# Patient Record
Sex: Female | Born: 1965
Health system: Southern US, Community
[De-identification: ages and names within clinical notes are randomized; demographics above are authoritative.]

## PROBLEM LIST (undated history)

## (undated) DIAGNOSIS — L97509 Non-pressure chronic ulcer of other part of unspecified foot with unspecified severity: Secondary | ICD-10-CM

## (undated) DIAGNOSIS — I639 Cerebral infarction, unspecified: Secondary | ICD-10-CM

## (undated) DIAGNOSIS — D649 Anemia, unspecified: Secondary | ICD-10-CM

## (undated) DIAGNOSIS — E119 Type 2 diabetes mellitus without complications: Secondary | ICD-10-CM

## (undated) DIAGNOSIS — I619 Nontraumatic intracerebral hemorrhage, unspecified: Secondary | ICD-10-CM

## (undated) DIAGNOSIS — Z960 Presence of urogenital implants: Secondary | ICD-10-CM

## (undated) DIAGNOSIS — M549 Dorsalgia, unspecified: Secondary | ICD-10-CM

## (undated) DIAGNOSIS — E11621 Type 2 diabetes mellitus with foot ulcer: Secondary | ICD-10-CM

## (undated) DIAGNOSIS — G629 Polyneuropathy, unspecified: Secondary | ICD-10-CM

## (undated) DIAGNOSIS — M1990 Unspecified osteoarthritis, unspecified site: Secondary | ICD-10-CM

## (undated) DIAGNOSIS — I10 Essential (primary) hypertension: Secondary | ICD-10-CM

## (undated) HISTORY — DX: Cerebral infarction, unspecified: I63.9

## (undated) HISTORY — PX: BRAIN SURGERY: SHX531

## (undated) NOTE — *Deleted (*Deleted)
Patient Care Team: Barbette Merino, NP as PCP - General (Adult Health Nurse Practitioner)  DIAGNOSIS: No diagnosis found.  CHIEF COMPLIANT: Follow-up of relapsed ITP onRituxan  INTERVAL HISTORY: [name] is a 84 y.o. with above-mentioned history of severe thrombocytopeniacurrently onRituxanand IVIG.Labs on 09/19/19 showed Hg 11.8, HCT 38.3, platelets 92. She presents to the clinic todayforfollow-up.  ALLERGIES:  has No Known Allergies.  MEDICATIONS:  Current Outpatient Medications  Medication Sig Dispense Refill  . acetaminophen (TYLENOL) 500 MG tablet Take 1,000 mg by mouth every 6 (six) hours as needed for mild pain.    Marland Kitchen amLODipine (NORVASC) 5 MG tablet Take 5 mg by mouth daily.    Marland Kitchen aspirin EC 81 MG tablet Take 81 mg by mouth daily as needed for moderate pain. Swallow whole.    . Blood Glucose Monitoring Suppl (PRODIGY AUTOCODE BLOOD GLUCOSE) w/Device KIT 1 kit by Does not apply route in the morning, at noon, in the evening, and at bedtime. 1 kit 0  . collagenase (SANTYL) ointment Apply 1 application topically daily. Wound #1: 2.0 x 1.0 x 0.3cm Wound #2: 2.5 x 1.0 x 2.5 cm Wound #3: 3.5 x 1.5 x 2.5 cm 30 g 0  . gabapentin (NEURONTIN) 400 MG capsule Take 1 capsule (400 mg total) by mouth 2 (two) times daily. 60 capsule 0  . isosorbide mononitrate (IMDUR) 30 MG 24 hr tablet Take 1 tablet (30 mg total) by mouth daily. 90 tablet 1  . metFORMIN (GLUCOPHAGE) 500 MG tablet Take 1 tablet (500 mg total) by mouth 2 (two) times daily with a meal. (Patient taking differently: Take 500 mg by mouth daily with breakfast. ) 60 tablet 1  . metoprolol succinate (TOPROL-XL) 50 MG 24 hr tablet Take 1 tablet (50 mg total) by mouth daily. Take with or immediately following a meal. 90 tablet 1  . ofloxacin (OCUFLOX) 0.3 % ophthalmic solution Place into the right eye.    . prednisoLONE acetate (PRED FORTE) 1 % ophthalmic suspension Place into the right eye.    . rosuvastatin (CRESTOR) 20 MG  tablet Take 1 tablet (20 mg total) by mouth daily. 90 tablet 1   No current facility-administered medications for this visit.    PHYSICAL EXAMINATION: ECOG PERFORMANCE STATUS: {CHL ONC ECOG PS:[id number]}  There were no vitals filed for this visit. There were no vitals filed for this visit.  LABORATORY DATA:  I have reviewed the data as listed CMP Latest Ref Rng & Units 09/19/2019 09/05/2019 08/08/2019  Glucose 70 - 99 mg/dL 161(W) 960(A) 540(J)  BUN 6 - 20 mg/dL 81(X) 91(Y) 14  Creatinine 0.44 - 1.00 mg/dL 7.82(N) 5.62(Z) 3.08  Sodium 135 - 145 mmol/L 139 136 137  Potassium 3.5 - 5.1 mmol/L 5.3(H) 5.3(H) 3.4(L)  Chloride 98 - 111 mmol/L 107 106 108  CO2 22 - 32 mmol/L 24 20(L) 24  Calcium 8.9 - 10.3 mg/dL 9.0 9.6 6.5(H)  Total Protein 6.5 - 8.1 g/dL 7.7 7.9 -  Total Bilirubin 0.3 - 1.2 mg/dL 0.3 0.3 -  Alkaline Phos 38 - 126 U/L 89 87 -  AST 15 - 41 U/L 19 17 -  ALT 0 - 44 U/L 17 10 -    Lab Results  Component Value Date   WBC 5.7 09/19/2019   HGB 11.8 (L) 09/19/2019   HCT 38.3 09/19/2019   MCV 91.0 09/19/2019   PLT 92 (L) 09/19/2019   NEUTROABS 4.3 09/19/2019    ASSESSMENT & PLAN:  No problem-specific Assessment &  Plan notes found for this encounter.    No orders of the defined types were placed in this encounter.  The patient has a good understanding of the overall plan. she agrees with it. she will call with any problems that may develop before the next visit here.  Total time spent: *** mins including face to face time and time spent for planning, charting and coordination of care  Serena Croissant, MD 09/25/2019  I, Kirt Boys Dorshimer, am acting as scribe for Dr. Serena Croissant.  {insert scribe attestation}

---

## 1998-01-20 ENCOUNTER — Emergency Department (HOSPITAL_COMMUNITY): Admission: EM | Admit: 1998-01-20 | Discharge: 1998-01-20 | Payer: Self-pay | Admitting: Emergency Medicine

## 1998-01-23 ENCOUNTER — Ambulatory Visit (HOSPITAL_COMMUNITY): Admission: RE | Admit: 1998-01-23 | Discharge: 1998-01-23 | Payer: Self-pay | Admitting: Obstetrics & Gynecology

## 1998-02-01 ENCOUNTER — Ambulatory Visit (HOSPITAL_COMMUNITY): Admission: RE | Admit: 1998-02-01 | Discharge: 1998-02-01 | Payer: Self-pay | Admitting: Obstetrics and Gynecology

## 1998-02-04 ENCOUNTER — Ambulatory Visit (HOSPITAL_COMMUNITY): Admission: RE | Admit: 1998-02-04 | Discharge: 1998-02-04 | Payer: Self-pay | Admitting: Obstetrics and Gynecology

## 1998-04-09 ENCOUNTER — Other Ambulatory Visit: Admission: RE | Admit: 1998-04-09 | Discharge: 1998-04-09 | Payer: Self-pay | Admitting: Obstetrics & Gynecology

## 2001-11-23 ENCOUNTER — Other Ambulatory Visit: Admission: RE | Admit: 2001-11-23 | Discharge: 2001-11-23 | Payer: Self-pay | Admitting: Family Medicine

## 2002-01-03 ENCOUNTER — Emergency Department (HOSPITAL_COMMUNITY): Admission: EM | Admit: 2002-01-03 | Discharge: 2002-01-03 | Payer: Self-pay | Admitting: Emergency Medicine

## 2002-01-05 ENCOUNTER — Inpatient Hospital Stay (HOSPITAL_COMMUNITY): Admission: AD | Admit: 2002-01-05 | Discharge: 2002-01-08 | Payer: Self-pay | Admitting: Obstetrics and Gynecology

## 2002-01-07 ENCOUNTER — Encounter: Payer: Self-pay | Admitting: Obstetrics and Gynecology

## 2002-01-27 ENCOUNTER — Inpatient Hospital Stay (HOSPITAL_COMMUNITY): Admission: AD | Admit: 2002-01-27 | Discharge: 2002-01-27 | Payer: Self-pay | Admitting: Obstetrics and Gynecology

## 2002-02-19 ENCOUNTER — Inpatient Hospital Stay (HOSPITAL_COMMUNITY): Admission: AD | Admit: 2002-02-19 | Discharge: 2002-02-23 | Payer: Self-pay | Admitting: *Deleted

## 2002-03-19 ENCOUNTER — Observation Stay (HOSPITAL_COMMUNITY): Admission: AD | Admit: 2002-03-19 | Discharge: 2002-03-20 | Payer: Self-pay | Admitting: Obstetrics and Gynecology

## 2002-05-01 ENCOUNTER — Inpatient Hospital Stay (HOSPITAL_COMMUNITY): Admission: AD | Admit: 2002-05-01 | Discharge: 2002-05-03 | Payer: Self-pay | Admitting: Obstetrics and Gynecology

## 2002-05-23 ENCOUNTER — Inpatient Hospital Stay (HOSPITAL_COMMUNITY): Admission: AD | Admit: 2002-05-23 | Discharge: 2002-05-29 | Payer: Self-pay | Admitting: Obstetrics and Gynecology

## 2002-05-25 ENCOUNTER — Encounter (INDEPENDENT_AMBULATORY_CARE_PROVIDER_SITE_OTHER): Payer: Self-pay

## 2002-05-25 ENCOUNTER — Encounter: Payer: Self-pay | Admitting: Obstetrics and Gynecology

## 2002-06-08 ENCOUNTER — Inpatient Hospital Stay (HOSPITAL_COMMUNITY): Admission: AD | Admit: 2002-06-08 | Discharge: 2002-06-10 | Payer: Self-pay | Admitting: Obstetrics and Gynecology

## 2002-06-08 ENCOUNTER — Encounter: Payer: Self-pay | Admitting: Obstetrics and Gynecology

## 2002-06-10 ENCOUNTER — Encounter: Payer: Self-pay | Admitting: Obstetrics and Gynecology

## 2002-07-30 ENCOUNTER — Other Ambulatory Visit: Admission: RE | Admit: 2002-07-30 | Discharge: 2002-07-30 | Payer: Self-pay | Admitting: Obstetrics and Gynecology

## 2003-11-07 ENCOUNTER — Ambulatory Visit: Payer: Self-pay | Admitting: Hematology and Oncology

## 2004-01-02 ENCOUNTER — Ambulatory Visit: Payer: Self-pay | Admitting: Hematology and Oncology

## 2004-01-05 HISTORY — PX: TUBAL LIGATION: SHX77

## 2004-02-18 ENCOUNTER — Ambulatory Visit: Payer: Self-pay | Admitting: Hematology and Oncology

## 2004-02-25 ENCOUNTER — Inpatient Hospital Stay (HOSPITAL_COMMUNITY): Admission: AD | Admit: 2004-02-25 | Discharge: 2004-03-03 | Payer: Self-pay | Admitting: Obstetrics and Gynecology

## 2004-02-29 ENCOUNTER — Encounter (INDEPENDENT_AMBULATORY_CARE_PROVIDER_SITE_OTHER): Payer: Self-pay | Admitting: Specialist

## 2006-07-05 ENCOUNTER — Encounter
Admission: RE | Admit: 2006-07-05 | Discharge: 2006-10-03 | Payer: Self-pay | Admitting: Physical Medicine & Rehabilitation

## 2006-07-12 ENCOUNTER — Ambulatory Visit: Payer: Self-pay | Admitting: Physical Medicine & Rehabilitation

## 2006-07-22 ENCOUNTER — Encounter
Admission: RE | Admit: 2006-07-22 | Discharge: 2006-07-22 | Payer: Self-pay | Admitting: Physical Medicine & Rehabilitation

## 2006-10-24 ENCOUNTER — Encounter
Admission: RE | Admit: 2006-10-24 | Discharge: 2006-10-24 | Payer: Self-pay | Admitting: Physical Medicine & Rehabilitation

## 2006-10-24 ENCOUNTER — Ambulatory Visit: Payer: Self-pay | Admitting: Physical Medicine & Rehabilitation

## 2006-12-30 ENCOUNTER — Emergency Department (HOSPITAL_COMMUNITY): Admission: EM | Admit: 2006-12-30 | Discharge: 2006-12-30 | Payer: Self-pay | Admitting: Emergency Medicine

## 2007-01-03 ENCOUNTER — Ambulatory Visit: Payer: Self-pay | Admitting: Vascular Surgery

## 2007-01-06 ENCOUNTER — Ambulatory Visit: Payer: Self-pay | Admitting: Internal Medicine

## 2007-01-17 ENCOUNTER — Ambulatory Visit: Payer: Self-pay | Admitting: Hematology and Oncology

## 2007-03-08 ENCOUNTER — Ambulatory Visit: Payer: Self-pay | Admitting: Hematology and Oncology

## 2007-05-12 ENCOUNTER — Ambulatory Visit: Payer: Self-pay | Admitting: Hematology and Oncology

## 2010-01-24 ENCOUNTER — Encounter: Payer: Self-pay | Admitting: Internal Medicine

## 2010-05-19 NOTE — Group Therapy Note (Signed)
7/1/08[name] is a 45 year old female who notes onset of low back as  well intrascapular pain since the birth of her child in 2006.  The low  back pain is worse than the intrascapular pain.  She has tried  Chiropractic treatment, has gotten some limited relief.  She has been  evaluated by Dr. Charlett Blake at [facility].  She has undergone  lumbar MRI without contrast dated July 09, 2005 which showed normal  disks, there is mild to moderate facet degenerative changes considering  her age of 26 at the time of the study.  No evidence of any type of  compressive lesions.  She has had normal OB examinations.   FAMILY HISTORY:  Positive for heart disease and hypertension.   PAST MEDICAL HISTORY:  Diabetes.   Patient's pain review indicates that her pain is sharp, constant,  tingling, and aching.  It is up to a 10 out of 10, worse with walking  and bending, improves with heat.  Pain is worse during the daytime  hours.  She has some pain in the knees and the toes as well.  She is  able to drive, climb steps, she needs some help with certain shopping  activities as well as heavier household duties such as mopping or  vacuuming.  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   REVIEW OF SYSTEMS:  Positive for numbness, tingling, depression, and she  has gained some weight.   Her other past medical history is positive for an intracerebral  hemorrhage in 1983-1984 which has left her with some chronic right lower  quadrant visual symptoms as well as mild decreased sensation on the  right side compared to the left side.   SOCIAL HISTORY:  Married, occasional alcohol.   EXAMINATION:  Blood pressure 144/90, pulse 110.  She indicates that she  is a bit nervous today about this visit.  Her respirations are 17, O2  sat 98% room air.  Her spine range of motion is good, she has pain with  end range extension and not with  end range flexion.  Her strength in her  upper and lower extremities is normal for range of motion and the upper  and lower extremities is normal.  Her sensation in the upper and lower  extremities is normal.   Her gait shows no evidence of toe drag or knee instability.  Her knees  have no evidence of crepitus.   IMPRESSION:  1. Lumbar pain given MRI findings and physical exam findings appears      to be most consistent with lumbar facet syndrome, mainly axial pain      in the lumbar spine.  2. Lower extremity pain, normal musculoskeletal exam, may be a sign of      early diabetic neuropathy given her history.  Would need to confirm      with EMG which we can do, however, will first focus on her low back      pain which is her primary complaint.  3. Intrascapular pain.  Palpation of that area __________ reveals no      evidence of scapular winging, no tenderness to palpation.  This may      represent a thoracic facet syndrome, but once again this is a      secondary complaint to her lumbar area pain.   PLAN:  1. I will schedule her for diagnostic lumbar facet medial facet  branch      blocks.  We went over risks and benefits and she would like to      proceed with this.  We will schedule in the next 1-2 weeks here in      this office.  2. Starting on Celebrex 200 mg a day in place of Etodolac, hope to get      increased efficacy, also no anti-platelet affect so that no      interference with the ability to perform spinal injections.   We will also check urine drug screen but do not think narcotic  analgesics are going to have a big role in this particular patient.      Erick Colace, M.D.  Electronically Signed     AEK/MedQ  D:  07/12/2006 17:20:35  T:  07/13/2006 10:10:12  Job #:  [id number]   cc:   Lenoard Aden, M.D.  Fax: [phone]   Renaye Rakers, M.D.  Fax: [phone]

## 2010-05-19 NOTE — Procedures (Signed)
[name], [name]             ACCOUNT NO.:  [id number]   MEDICAL RECORD NO.:  [id number]          PATIENT TYPE:  REC   LOCATION:  [facility]                          FACILITY:  [facility]   PHYSICIAN:  [name], M.D.DATE OF BIRTH:  [birth year]   DATE OF PROCEDURE:  10/24/2006  DATE OF DISCHARGE:                               OPERATIVE REPORT   A 45 year old female with low back pain with positive facet arthropathy  on imaging studies.  Pain is axial, increased at extension.  Interferes  with ADLs an Mobility only partially relieved with medications.   Procedure:  Bilateral L5 Dorsal Ramus injection L4 medial branch block  L3 medial branch block under fluoroscopic guideance   Informed consent was obtained after describing risks and benefits  procedure to the patient.  These include bleeding, bruising, infection,  loss of bowel or bladder function, temporary or permanent paralysis,  elects to proceed and has given written consent.  The patient placed  prone on fluoroscopy table.  Betadine prep, sterile drape 25-gauge 1-1/2  inch needle was used to anesthetize skin and subcu tissue with 1%  lidocaine x2 mL, a 22 gauge 5 inch spinal needle was inserted under  fluoroscopic guidance first targeting left S1 SAP sacral ala junction  bone contact made with lateral imaging.  Omnipaque 180 x 0.5 mL  demonstrated no intravascular uptake.  Then 0.5 mL dexamethasone  lidocaine solution injected.  The same procedure was repeated on the  right side corresponding levels same technique injectate and equipment.  Then the left, then the right L5 SAP transverse junction targeted bone  contact made confirmed lateral imaging.  Omnipaque 180 x 0.5 mL  demonstrated no intravascular uptake and 0.5 mL dexamethasone lidocaine  solution injected and this same procedure was done corresponding levels  left side, then the left and then the right L5-L4 SAP transverse process  junction target, bone contact made  confirmed lateral imaging.  Omnipaque  180 x 0.5 mL demonstrated no intravascular uptake and 0.5 mL of  dexamethasone lidocaine solution injected.  Pre post injection level  vitals stable, same corresponding levels done on left side.  Post  injection vitals stable.  Post injection instructions given.  Return in  3 weeks.  Pain diary given.  Will assess for at least 50% reduction in  pain levels.      [name], M.D.  Electronically Signed     AEK/MEDQ  D:  10/24/2006 15:12:08  T:  10/25/2006 11:07:40  Job:  [id number]

## 2010-05-19 NOTE — Procedures (Signed)
DUPLEX DEEP VENOUS EXAM - LOWER EXTREMITY   INDICATION:  Right leg pain and swelling.   HISTORY:  Edema:  Yes.  Trauma/Surgery:  No.  Pain:  Yes.  PE:  No.  Previous DVT:  No.  Anticoagulants:  Other:   DUPLEX EXAM:                CFV   SFV   PopV  PTV    GSV                R  L  R  L  R  L  R   L  R  L  Thrombosis    0  0  0     0     0      0  Spontaneous   +  +  +     +     +      +  Phasic        +  +  +     +     +      +  Augmentation  +  +  +     +     +      +  Compressible  +  +  +     +     +      +  Competent     +  +  +     +     +      +   Legend:  + - yes  o - no  p - partial  D - decreased   IMPRESSION:  No evidence of deep venous thrombosis noted in the right  leg.   Harl Favor at the doctor's office.    _____________________________  Larina Earthly, M.D.   MG/MEDQ  D:  01/03/2007  T:  01/03/2007  Job:  [id number]

## 2010-05-22 NOTE — Discharge Summary (Signed)
   [name], [name]                       ACCOUNT NO.:  [id number]   MEDICAL RECORD NO.:  [id number]                   PATIENT TYPE:  INP   LOCATION:  9129                                 [facility]ACILITY:  [facility]   PHYSICIAN:  Lenoard Aden, M.D.             DATE OF BIRTH:  01/23/1965   DATE OF ADMISSION:  01/05/2002  DATE OF DISCHARGE:  01/08/2002                                 DISCHARGE SUMMARY   HISTORY OF PRESENT ILLNESS:  The patient was admitted for nausea and  vomiting in second trimester of pregnancy, poor tolerance to medications on  January 05, 2002.  Received IV fluids, IV potassium, blood glucose  monitoring, Protonix, and Reglan.  Improved slowly over the course of her  hospitalization.  Potassium improved.  She was discharged to home on  hospital day number four on her diabetic protocol, Protonix and Reglan.  She  is to follow up in the office in one week.                                               Lenoard Aden, M.D.    RJT/MEDQ  D:  03/20/2002  T:  03/21/2002  Job:  [id number]

## 2010-05-22 NOTE — H&P (Signed)
   NAME:  [name], [name]                         ACCOUNT NO.:  [id number]   MEDICAL RECORD NO.:  [id number]                   PATIENT TYPE:   LOCATION:                                       FACILITY:   PHYSICIAN:  Nickolus Wadding DICTATOR                    DATE OF BIRTH:   DATE OF ADMISSION:  DATE OF DISCHARGE:                                HISTORY & PHYSICAL   CHIEF COMPLAINT:  1. Nausea and vomiting.  2. Diabetes mellitus out of control.  3. Elevated blood pressure.   HISTORY OF PRESENT ILLNESS:  The patient is a 45 year old African-American  female, Gravida II, Para 0, at 17 weeks with intrauterine gestation  estimated date of confinement June 15, 2002, who presents with worsening  nausea and vomiting and 10 pound weight loss over four weeks. She was seen  in the [facility] emergency room two days ago with an elevated  blood glucose of 266 and a blood pressure of 150/100. The patient has been  noncompliant with follow-up of her obstetric care.   PAST MEDICAL HISTORY:  Uncomplicated miscarriage in 2000 in the first  trimester. She has had otherwise, normal GYN history. She has an ultrasound  confirming viability.   SOCIAL HISTORY:  Nonsmoker and nondrinker. She denies domestic or physical  violence.   MEDICATIONS:  None.   FAMILY HISTORY:  She has a grandmother with diabetes mellitus, heart  disease, and hypertension.   PHYSICAL EXAMINATION:  GENERAL: An ill appearing African-American female in  no obvious distress.  VITAL SIGNS: Weight 223 pounds. Blood pressure 140/90.  LUNGS: Clear.  HEART: Regular rate and rhythm.  ABDOMEN: Soft, gravid, 17 weeks and nontender.  GU: Cervix closed.  EXTREMITIES: Within normal limits.  NEURO: Nonfocal.   IMPRESSION:  1. A 17 week intrauterine pregnancy.  2. Nausea and vomiting with pregnancy.  3. Poorly controlled diabetes mellitus.  4. Probable chronic hypertension.    PLAN:  Admit to [facility]. IV fluid.  Hydration. Observation of blood  pressures. Check fasting glucoses and 2 hour postprandial glucoses.  Establish insulin protocol as needed. Diabetic teaching. Follow-up in the  office within one week. Will check hemoglobin A1C. Will check chemistry 24.                                                 Ha Shannahan DICTATOR    DD/MEDQ  D:  01/05/2002  T:  01/05/2002  Job:  [id number]   cc:   Lenoard Aden, M.D.  [address], [address]  [place]  [place] [address]  Fax: [phone]

## 2010-05-22 NOTE — Discharge Summary (Signed)
[name], [name]             ACCOUNT NO.:  [id number]   MEDICAL RECORD NO.:  [id number]          PATIENT TYPE:  INP   LOCATION:  9109                          [facility]ILITY:  [facility]   PHYSICIAN:  Lenoard Aden, M.D.DATE OF BIRTH:  11/24/1965   DATE OF ADMISSION:  02/25/2004  DATE OF DISCHARGE:  03/03/2004                                 DISCHARGE SUMMARY   ADMISSION DIAGNOSES:  1.  Nausea/vomiting.  2.  Chronic hypertension.  3.  Diabetes.   DISCHARGE DIAGNOSES:  1.  Nausea/vomiting.  2.  Chronic hypertension.  3.  Diabetes.  4.  Repeat cesarean section and tubal ligation.   The patient was admitted for chronic hypertension with persistent nausea and  vomiting February 25, 2004. Course was complicated by inability to control  her symptomatology despite IV fluids, antiemetics, and GI consultation. Due  to worsening superimposed preeclampsia, decision was made at 35 weeks to  proceed with repeat C-section with superimposed preeclampsia. Tubal ligation  was performed. Postoperative course uncomplicated, magnesium tocolysis  performed, I&O's within normal limits. The patient is discharged to home on  postoperative day #3. Labetalol, prednisone, Motrin, Tylox, prenatal  vitamins given. The patient is to follow up with her internal medicine  doctor, follow-up in the office within 1 week. Discharge teaching done.      RJT/MEDQ  D:  03/30/2004  T:  03/30/2004  Job:  [id number]

## 2010-05-22 NOTE — Op Note (Signed)
[name], [name]             ACCOUNT NO.:  [id number]   MEDICAL RECORD NO.:  [id number]          PATIENT TYPE:  INP   LOCATION:  9372                          [facility]ACILITY:  [facility]   PHYSICIAN:  Lenoard Aden, M.D.DATE OF BIRTH:  10/29/1965   DATE OF PROCEDURE:  02/29/2004  DATE OF DISCHARGE:                                 OPERATIVE REPORT   PREOPERATIVE DIAGNOSES:  1.  35-3/7 week intrauterine pregnancy.  2.  Chronic hypertension with superimposed preeclampsia.  3.  Previous cesarean section for repeat and tubal ligation.  4.  Homero Fellers breech presentation.   POSTOPERATIVE DIAGNOSES:  1.  35-3/7 week intrauterine pregnancy.  2.  Chronic hypertension with superimposed preeclampsia.  3.  Previous cesarean section for repeat and tubal ligation.  4.  Homero Fellers breech presentation.   PROCEDURE:  Repeat low segment transverse cesarean section and tubal  ligation.   SURGEON:  Lenoard Aden, M.D.   ASSISTANT:  Richardean Sale, M.D.   ANESTHESIA:  Spinal by Quillian Quince, M.D.   ESTIMATED BLOOD LOSS:  7 cc.   COMPLICATIONS:  None.   COUNTS:  Correct.   DISPOSITION:  Patient to recovery in good condition.   DRAINS:  Foley catheter.   FINDINGS:  Full-term living female in frank breech position.  Apgars 8 and  9.  Cord pH pending.  Pediatricians in attendance.   SPECIMENS:  Tubal segments and placenta to pathology.   DESCRIPTION OF PROCEDURE:  Being apprised of the risks of anesthesia,  infection, bleeding, intra-abdominal ____________ immediate complications to  include bowel and bladder injury were noted and failure risks of tubal  ligation of 5 to 10 per 1000.  The patient was brought to the operating room  where she was administered a spinal anesthetic without complication.  She  was prepped and draped in the usual sterile fashion.  A Foley catheter was  placed.   After achieving adequate anesthesia and Marcaine solution placed in the  area.  A Pfannenstiel skin  incision was made with the scalpel and carried  down to the fascia which was nicked in the midline and opened transversely  using Mayo scissors.  The rectus muscles were dissected sharply in the  midline.  The peritoneum was entered sharply.  Omentum was adhesed to the  posterior aspect of the anterior abdominal wall.  This was released using  electrocautery, and good hemostasis was noted.  The bladder flap was sharply  developed off the lower uterine segment using Metzenbaum scissors.  The  bladder blade was placed.  A curved hysterotomy incision was made.  Amniotomy with clear fluid.  Atraumatic delivery, frank breech position.  Usual maneuvers with suction of the fetal vertex Handed to the pediatricians  in attendance.  Apgars 8 and 9.  Cord blood collected.  Cord pH collected.  Placenta delivered manually intact and sent to pathology.  The uterus was  exteriorized and curetted using a dry lap pad.  It was closed in one layer  using 0 Monocryl continuous running suture.  Lateral sutures at the angles  for hemostasis were placed.  Interrupted sutures x2.  At this time, the right tube was traced out to the fimbriated end.  The  ampullary isthmic portion of tube was identified.  Avascular portion of the  mesosalpinx was cauterized, creating a window.  0 plain ties were placed  proximally and distally and tubal segment excised.  The same procedure done  on the left tube.  Tubal segment was excised and sent to pathology.  Good  hemostasis was noted.  The tubal lumens were cauterized, and good hemostasis  was noted.  The uterine incision was found to be hemostatic.  The bladder  flap was hemostatic.  Irrigation accomplished.  The fascia was closed using  0 Monocryl in the fascia and the skin closed using staples.   The patient tolerated the procedure well and was transferred to recovery in  good condition.      RJT/MEDQ  D:  02/29/2004  T:  03/01/2004  Job:  [id number]

## 2010-05-22 NOTE — Consult Note (Signed)
NAMEVANYA, [name]             ACCOUNT NO.:  [id number]   MEDICAL RECORD NO.:  [id number]          PATIENT TYPE:  INP   LOCATION:  9158                          FACILITY:  [facility]   PHYSICIAN:  Bernette Redbird, M.D.   DATE OF BIRTH:  09-Dec-1965   DATE OF CONSULTATION:  02/27/2004  DATE OF DISCHARGE:                                   CONSULTATION   Dr. Seymour Bars, covering for Dr. Billy Coast, asked [place] to see this 45 year old  African-American female who is [redacted] weeks pregnant because of coffee ground  emesis.   [name] is known to my partner, Dr. Ewing Schlein, when she was in the hospital  two years ago with nausea and vomiting during a pregnancy at that time.   She has not had any __________ nausea or vomiting problems but was admitted  to the hospital two days ago with her first episode of nausea and vomiting  associated with this pregnancy.  Of note, the patient denies GI  symptomatology in between her pregnancies, that is, she does not have any  chronic GI problems. She has never had an ulcer and has not recently been on  ulcerogenic medications. She is not currently having abdominal pain.   Today, her emesis turned from being bilious in character to being black  although it has subsequently changed back to being bilious in character, per  conversation with the nurses. She has never had frank blood or clots.   The patient's platelet count has been low, the etiology of which is not  entirely clear since she does not have elevated liver chemistries (HELLP  syndrome, for example).   PAST MEDICAL HISTORY:  No known allergies.   OUTPATIENT MEDICATIONS:  Labetalol, glyburide, ferrous sulfate and  prednisone.   OPERATION:  None.   PREVIOUS ILLNESSES:  Some sort of CNS problem, possibly an aneurysm or  stroke in the past, apparently treated surgically, but no abdominal surgery  other than the previous C section.   CHRONIC MEDICAL ILLNESSES:  Gestational diabetes and atypical nausea and  vomiting with prior pregnancy.   FAMILY HISTORY:  Negative for GI illnesses.   REVIEW OF SYMPTOMS:  See above.   PHYSICAL EXAMINATION:  GENERAL:  [name] is a pleasant somewhat overweight  African-American female in no evident distress appearing neither anxious nor  depressed. She is anicteric and without frank pallor.  CHEST:  Clear.  HEART:  Has a slightly rapid rate but is unremarkable. There may be a 1/6  systolic murmur at the upper left sternal border.  ABDOMEN:  Of course gravid but nontender.   LABORATORY DATA:  Hemoglobin today is 8.7, unchanged from yesterday.  White  count 7100.  Her platelet count has risen from 61,000 yesterday to 83,000  currently.  83 polys, 11 lymphs, 7 monocytes.  Chemistry panel shows a BUN  yesterday of 4 and a BUN today of 6 and liver chemistries are within normal  limits.   IMPRESSION:  1.  Transient coffee ground emesis, now resolved, unlikely to be reflective      of any clinically significant GI bleed given the stability of her  hemoglobin over the past 24 hours and the repeatedly normal BUN level. I      presume that the coffee ground emesis is coming from mucosal trauma      related to the patient's repeated vomiting, enhanced by the presence of      her thrombocytopenia.  It seems to be resolving either due to an      improvement in her platelet count today and/or the institution of PPI      therapy (she was previously on famotidine, now is on intravenous      Protonix).  2.  Protracted vomiting, probably some variant of pregnancy-associated      gastric dysmotility. I doubt it is due to an ulcer or gastric outlet      obstruction.  (Note the absence of any succussion splash on current      examination, plus she has no ulcer risk factors).   RECOMMENDATIONS:  1.  I would favor continuing Protonix therapy until she goes about 24 hours      without coffee ground emesis, after which she could be placed on an H2      blocker such as  Pepcid. She should probably remain on that medication      until her GI symptoms clear up.  2.  At this time, I do not think endoscopy would be helpful and therefore I      would hold off on doing an EGD unless or until active frank GI bleeding      occurs. I do not think an endoscopy would be likely to lead to      clinically useful diagnostic information (for example, I think there is      a low probability of an ulcer, and even if there is, she is being      treated for it anyway with the protonix).  3.  If the nausea and vomiting persist, and for example, the patient cannot      or does not undergo a C section with resolution of symptoms in the near      future, the patient might be considered for nutritional support with      TNA.   We appreciate the opportunity to have seen this patient in consultation with  you.      RB/MEDQ  D:  02/27/2004  T:  02/27/2004  Job:  [id number]   cc:   Lenoard Aden, M.D.  [address]  [place]  [place] [address]  Fax: [phone]

## 2010-05-22 NOTE — H&P (Signed)
[name], [name]             ACCOUNT NO.:  [id number]   MEDICAL RECORD NO.:  [id number]          PATIENT TYPE:  INP   LOCATION:  9158                          FACILITY:  [facility]   PHYSICIAN:  Lenoard Aden, M.D.DATE OF BIRTH:  10-02-1965   DATE OF ADMISSION:  02/25/2004  DATE OF DISCHARGE:                                HISTORY & PHYSICAL   CHIEF COMPLAINT:  Nausea and vomiting.   HISTORY OF PRESENT ILLNESS:  The patient is a 45 year old African American  female, G3, P1, with EDC of April 01, 2004 at [redacted] weeks gestation with known  class AII diabetes mellitus, chronic hypertension, and atypical nausea and  vomiting. She is presenting now for the first episode of this pregnancy.  Previous pregnancy complicated by atypical nausea and vomiting with normal  GI workup.   PAST MEDICAL HISTORY:  1.  First trimester pregnancy loss.  2.  Primary cesarean section.  3.  History of postpartum congestive heart failure.  4.  History of diabetes onset during pregnancy.  5.  History of hypertensive disease.  6.  History of gestational tinea pedis versus ITP this pregnancy with follow-      up hematology.   FAMILY HISTORY:  Diabetes, heart disease, and hypertension.   MEDICATIONS:  Glyburide, prednisone, prenatal vitamins, Labetalol.   SOCIAL HISTORY:  Otherwise noncontributory.   PHYSICAL EXAMINATION:  GENERAL:  She is an ill-appearing African American  female in no acute distress.  VITAL SIGNS:  Blood pressure 122/70. Weight of 224, down 9 pounds over the  last 9 days.  HEENT:  Normal.  LUNGS:  Clear.  HEART:  Regular rate and rhythm.  ABDOMEN:  Soft, gravid, and nontender. Fundal height of 35 noted. EPP 8 out  of 8 with AGA fetus noted.  PELVIC:  Cervical exam is deferred.  EXTREMITIES:  No cords. DTRs are 2+. No evidence of clonus.  NEUROLOGICAL:  Nonfocal.   LABORATORY DATA:  Normal metabolic profile and a CBC remarkable for a  platelet count of 56,000.   IMPRESSION:  1.   A 35-week obstetrical.  2.  Class AII diabetes mellitus, previously stable on Glyburide.  3.  Probable idiopathic thrombocytopenic purpura, currently on prednisone      and noncompliant with the medication.  4.  Atypical nausea and vomiting, probably secondary to reflux.  5.  Chronic hypertension.  6.  History of postpartum congestive heart failure.   PLAN:  Plan is to admit. IV fluids. IV Reglan, IV Pepcid. Phenergan p.r.n.  Continue medications as previously noted. Hematology consult as needed.  Continuous monitoring. Add Ambien for sleep. Anticipate short-term  hospitalization versus hospitalization until needed for delivery.      RJT/MEDQ  D:  02/25/2004  T:  02/25/2004  Job:  [id number]   cc:   [facility]

## 2010-10-09 LAB — DIFFERENTIAL
Basophils Absolute: 0
Basophils Relative: 0
Eosinophils Absolute: 0.1
Eosinophils Relative: 2
Lymphocytes Relative: 18
Lymphs Abs: 0.9
Monocytes Absolute: 0.4
Monocytes Relative: 7
Neutro Abs: 3.7
Neutrophils Relative %: 73

## 2010-10-09 LAB — BASIC METABOLIC PANEL
BUN: 9
CO2: 25
Calcium: 9.1
Chloride: 102
Creatinine, Ser: 0.77
GFR calc Af Amer: >60
GFR calc non Af Amer: >60
Glucose, Bld: 195 — ABNORMAL HIGH
Potassium: 4.1
Sodium: 136

## 2010-10-09 LAB — CBC
HCT: 34.6 — ABNORMAL LOW
Hemoglobin: 11.3 — ABNORMAL LOW
MCHC: 32.6
MCV: 85.7
Platelets: 65 — ABNORMAL LOW
RBC: 4.03
RDW: 13.8
WBC: 5.1

## 2010-10-09 LAB — D-DIMER, QUANTITATIVE (NOT AT ARMC): D-Dimer, Quant: 0.57 — ABNORMAL HIGH

## 2010-10-09 LAB — B-NATRIURETIC PEPTIDE (CONVERTED LAB): Pro B Natriuretic peptide (BNP): <30

## 2010-11-13 ENCOUNTER — Other Ambulatory Visit: Payer: Self-pay | Admitting: Family Medicine

## 2010-11-13 DIAGNOSIS — Z1231 Encounter for screening mammogram for malignant neoplasm of breast: Secondary | ICD-10-CM

## 2010-11-22 ENCOUNTER — Encounter: Payer: Self-pay | Admitting: *Deleted

## 2010-11-22 ENCOUNTER — Emergency Department (HOSPITAL_COMMUNITY): Payer: 59

## 2010-11-22 ENCOUNTER — Inpatient Hospital Stay (HOSPITAL_COMMUNITY)
Admission: EM | Admit: 2010-11-22 | Discharge: 2010-11-29 | DRG: 041 | Disposition: A | Discharge status: Home Health | Payer: 59 | Attending: Internal Medicine | Admitting: Internal Medicine

## 2010-11-22 DIAGNOSIS — R209 Unspecified disturbances of skin sensation: Secondary | ICD-10-CM

## 2010-11-22 DIAGNOSIS — Z9119 Patient's noncompliance with other medical treatment and regimen: Secondary | ICD-10-CM

## 2010-11-22 DIAGNOSIS — M1990 Unspecified osteoarthritis, unspecified site: Secondary | ICD-10-CM | POA: Insufficient documentation

## 2010-11-22 DIAGNOSIS — E11621 Type 2 diabetes mellitus with foot ulcer: Secondary | ICD-10-CM

## 2010-11-22 DIAGNOSIS — I619 Nontraumatic intracerebral hemorrhage, unspecified: Secondary | ICD-10-CM | POA: Insufficient documentation

## 2010-11-22 DIAGNOSIS — L0390 Cellulitis, unspecified: Secondary | ICD-10-CM

## 2010-11-22 DIAGNOSIS — M129 Arthropathy, unspecified: Secondary | ICD-10-CM | POA: Diagnosis present

## 2010-11-22 DIAGNOSIS — D500 Iron deficiency anemia secondary to blood loss (chronic): Secondary | ICD-10-CM | POA: Diagnosis present

## 2010-11-22 DIAGNOSIS — L608 Other nail disorders: Secondary | ICD-10-CM | POA: Diagnosis present

## 2010-11-22 DIAGNOSIS — H539 Unspecified visual disturbance: Secondary | ICD-10-CM

## 2010-11-22 DIAGNOSIS — L97509 Non-pressure chronic ulcer of other part of unspecified foot with unspecified severity: Secondary | ICD-10-CM | POA: Diagnosis present

## 2010-11-22 DIAGNOSIS — N920 Excessive and frequent menstruation with regular cycle: Secondary | ICD-10-CM | POA: Diagnosis present

## 2010-11-22 DIAGNOSIS — E1142 Type 2 diabetes mellitus with diabetic polyneuropathy: Secondary | ICD-10-CM | POA: Diagnosis present

## 2010-11-22 DIAGNOSIS — E1149 Type 2 diabetes mellitus with other diabetic neurological complication: Principal | ICD-10-CM | POA: Diagnosis present

## 2010-11-22 DIAGNOSIS — M9080 Osteopathy in diseases classified elsewhere, unspecified site: Secondary | ICD-10-CM | POA: Diagnosis present

## 2010-11-22 DIAGNOSIS — I96 Gangrene, not elsewhere classified: Secondary | ICD-10-CM | POA: Diagnosis present

## 2010-11-22 DIAGNOSIS — I69998 Other sequelae following unspecified cerebrovascular disease: Secondary | ICD-10-CM

## 2010-11-22 DIAGNOSIS — Z7982 Long term (current) use of aspirin: Secondary | ICD-10-CM

## 2010-11-22 DIAGNOSIS — M869 Osteomyelitis, unspecified: Secondary | ICD-10-CM | POA: Diagnosis present

## 2010-11-22 DIAGNOSIS — E11628 Type 2 diabetes mellitus with other skin complications: Secondary | ICD-10-CM

## 2010-11-22 DIAGNOSIS — E1165 Type 2 diabetes mellitus with hyperglycemia: Secondary | ICD-10-CM

## 2010-11-22 DIAGNOSIS — M6240 Contracture of muscle, unspecified site: Secondary | ICD-10-CM | POA: Diagnosis present

## 2010-11-22 DIAGNOSIS — I10 Essential (primary) hypertension: Secondary | ICD-10-CM | POA: Diagnosis present

## 2010-11-22 DIAGNOSIS — G629 Polyneuropathy, unspecified: Secondary | ICD-10-CM

## 2010-11-22 DIAGNOSIS — E119 Type 2 diabetes mellitus without complications: Secondary | ICD-10-CM | POA: Diagnosis present

## 2010-11-22 DIAGNOSIS — Z91199 Patient's noncompliance with other medical treatment and regimen due to unspecified reason: Secondary | ICD-10-CM

## 2010-11-22 DIAGNOSIS — D649 Anemia, unspecified: Secondary | ICD-10-CM

## 2010-11-22 HISTORY — DX: Nontraumatic intracerebral hemorrhage, unspecified: I61.9

## 2010-11-22 HISTORY — DX: Type 2 diabetes mellitus with foot ulcer: L97.509

## 2010-11-22 HISTORY — DX: Type 2 diabetes mellitus with foot ulcer: E11.621

## 2010-11-22 HISTORY — DX: Essential (primary) hypertension: I10

## 2010-11-22 HISTORY — DX: Polyneuropathy, unspecified: G62.9

## 2010-11-22 HISTORY — DX: Unspecified osteoarthritis, unspecified site: M19.90

## 2010-11-22 LAB — CBC
HCT: 28.3 % — ABNORMAL LOW (ref 36.0–46.0)
Hemoglobin: 9.6 g/dL — ABNORMAL LOW (ref 12.0–15.0)
MCH: 26.7 pg (ref 26.0–34.0)
MCHC: 33.9 g/dL (ref 30.0–36.0)
MCV: 78.8 fL (ref 78.0–100.0)
Platelets: 435 10*3/uL — ABNORMAL HIGH (ref 150–400)
RBC: 3.59 MIL/uL — ABNORMAL LOW (ref 3.87–5.11)
RDW: 17.9 % — ABNORMAL HIGH (ref 11.5–15.5)
WBC: 11.2 10*3/uL — ABNORMAL HIGH (ref 4.0–10.5)

## 2010-11-22 LAB — BASIC METABOLIC PANEL
BUN: 9 mg/dL (ref 6–23)
CO2: 26 mEq/L (ref 19–32)
Calcium: 9.6 mg/dL (ref 8.4–10.5)
Chloride: 90 mEq/L — ABNORMAL LOW (ref 96–112)
Creatinine, Ser: 0.69 mg/dL (ref 0.50–1.10)
GFR calc Af Amer: >90 mL/min (ref >90)
GFR calc non Af Amer: >90 mL/min (ref >90)
Glucose, Bld: 420 mg/dL — ABNORMAL HIGH (ref 70–99)
Potassium: 3.8 mEq/L (ref 3.5–5.1)
Sodium: 129 mEq/L — ABNORMAL LOW (ref 135–145)

## 2010-11-22 LAB — GLUCOSE, CAPILLARY
Glucose-Capillary: 459 mg/dL — ABNORMAL HIGH (ref 70–99)
Glucose-Capillary: 329 mg/dL — ABNORMAL HIGH (ref 70–99)

## 2010-11-22 MED ORDER — INSULIN REGULAR HUMAN 100 UNIT/ML IJ SOLN
4.0000 [IU] | Freq: Once | INTRAMUSCULAR | Status: DC
  Filled 2010-11-22: qty 0.04

## 2010-11-22 MED ORDER — ACETAMINOPHEN 325 MG PO TABS
ORAL_TABLET | ORAL | Status: AC
  Administered 2010-11-22: 650 mg via ORAL
  Filled 2010-11-22: qty 2

## 2010-11-22 MED ORDER — VANCOMYCIN HCL IN DEXTROSE 1-5 GM/200ML-% IV SOLN
1000.0000 mg | Freq: Once | INTRAVENOUS | Status: AC
  Administered 2010-11-22: 1000 mg via INTRAVENOUS
  Filled 2010-11-22: qty 200

## 2010-11-22 MED ORDER — PIPERACILLIN-TAZOBACTAM 3.375 G IVPB
3.3750 g | Freq: Once | INTRAVENOUS | Status: AC
  Administered 2010-11-22: 3.375 g via INTRAVENOUS
  Filled 2010-11-22: qty 50

## 2010-11-22 MED ORDER — INSULIN REGULAR HUMAN 100 UNIT/ML IJ SOLN
4.0000 [IU] | Freq: Once | INTRAMUSCULAR | Status: DC
  Administered 2010-11-22: 4 [IU] via SUBCUTANEOUS

## 2010-11-22 MED ORDER — INSULIN ASPART 100 UNIT/ML ~~LOC~~ SOLN: 4.0000 [IU] | Freq: Once | SUBCUTANEOUS | Status: DC

## 2010-11-22 MED ORDER — INSULIN ASPART 100 UNIT/ML ~~LOC~~ SOLN
SUBCUTANEOUS | Status: AC
  Filled 2010-11-22: qty 1

## 2010-11-22 MED ORDER — SODIUM CHLORIDE 0.9 % IV BOLUS (SEPSIS)
1000.0000 mL | Freq: Once | INTRAVENOUS | Status: AC
  Administered 2010-11-22: 1000 mL via INTRAVENOUS

## 2010-11-22 NOTE — ED Notes (Signed)
Reports bleeding to right foot since yesterday, pt has bandaged pta. Reports recently being diagnosed with DM, swelling noted to foot and leg, redness and warm to touch. Bleeding controlled at this time.

## 2010-11-22 NOTE — ED Provider Notes (Addendum)
History     CSN: [id number] Arrival date & time: 11/22/2010  6:13 PM   First MD Initiated Contact with Patient 11/22/10 1900      Chief Complaint  Patient presents with  . Foot Swelling    (Consider location/radiation/quality/duration/timing/severity/associated sxs/prior treatment) The history is provided by the patient.   is reports several weeks of worsening ulcerations of his of her bilateral feet and reports since yesterday her skin on the right foot sloughed off when she began having bleeding.  She is a diabetic and has been for approximately a year she's been noncompliant with medications and her diabetic regimen.  She denies fever or chills.  She does report redness and erythema of her proximal right ankles as well.  She would like antibiotics and to go home and take care of her kids.  She has a primary care physician within the last 2 weeks at [facility].  Nothing worsens her symptoms.  Nothing improves her symptoms.  Her symptoms are constant.  Does have a history of neuropathy.  Past Medical History  Diagnosis Date  . Diabetes mellitus   . Hypertension   . Arthritis   . Neuropathy     History reviewed. No pertinent past surgical history.  History reviewed. No pertinent family history.  History  Substance Use Topics  . Smoking status: Never Smoker   . Smokeless tobacco: Not on file  . Alcohol Use: No    OB History    Grav Para Term Preterm Abortions TAB SAB Ect Mult Living                  Review of Systems  All other systems reviewed and are negative.    Allergies  Review of patient's allergies indicates no known allergies.  Home Medications   Current Outpatient Rx  Name Route Sig Dispense Refill  . ASPIRIN EC 81 MG PO TBEC Oral Take 81 mg by mouth daily.      Marland Kitchen NAPROXEN SODIUM 220 MG PO TABS Oral Take 440 mg by mouth daily as needed.        BP 149/78  Pulse 113  Temp(Src) 98.8 F (37.1 C) (Oral)  Resp 20  SpO2 100%  LMP  10/19/2010  Physical Exam  Nursing note and vitals reviewed. Constitutional: She is oriented to person, place, and time. She appears well-developed and well-nourished. No distress.  HENT:  Head: Normocephalic and atraumatic.  Eyes: EOM are normal.  Neck: Normal range of motion.  Cardiovascular: Normal rate, regular rhythm and normal heart sounds.   Pulmonary/Chest: Effort normal and breath sounds normal.  Abdominal: Soft. She exhibits no distension. There is no tenderness.  Musculoskeletal:       Left foot with small blistering ulceration on the sole of her foot without significant surrounding erythema or drainage of purulent material.  Right foot with significant erythema swelling and tenderness of her right foot right ankle and right distal anterior tibia she has skin loss overlying the distal aspects of her first and second and third metatarsals.  There is a foul smell coming from this area with a small amount of nonpulsatile bleeding.  She appears to have necrosis of several of her distal toes.  There does appear to be a palpable DP pulse in her right and left foot  Neurological: She is alert and oriented to person, place, and time.  Skin: Skin is warm and dry.  Psychiatric: She has a normal mood and affect. Judgment normal.  ED Course  Procedures (including critical care time)  Labs Reviewed  GLUCOSE, CAPILLARY - Abnormal; Notable for the following:    Glucose-Capillary 459 (*)    All other components within normal limits  CBC - Abnormal; Notable for the following:    WBC 11.2 (*)    RBC 3.59 (*)    Hemoglobin 9.6 (*)    HCT 28.3 (*)    RDW 17.9 (*)    Platelets 435 (*)    All other components within normal limits  BASIC METABOLIC PANEL - Abnormal; Notable for the following:    Sodium 129 (*)    Chloride 90 (*)    Glucose, Bld 420 (*)    All other components within normal limits  GLUCOSE, CAPILLARY - Abnormal; Notable for the following:    Glucose-Capillary 329 (*)     All other components within normal limits  HEMOGLOBIN A1C  CULTURE, BLOOD (ROUTINE X 2)  CULTURE, BLOOD (ROUTINE X 2)   Dg Foot Complete Right  11/22/2010  *RADIOLOGY REPORT*  Clinical Data: Diabetic foot ulcers.  RIGHT FOOT COMPLETE - 3+ VIEW 11/22/2010:  Comparison: None.  Findings: Marked soft tissue swelling and gas within the soft tissues of the dorsum of the foot overlying the second MTP joint. No radiographic evidence of underlying osteomyelitis.  Marked soft tissue swelling involving the great toe and second toe.  No evidence of acute, subacute, or healed fractures.  Bone mineral density well preserved.  Moderate sized plantar calcaneal spur. Enthesopathy at the insertion of the Achilles tendon on the posterior calcaneus.  IMPRESSION: Marked soft tissue swelling and gas within soft tissue ulcers of the dorsum of the foot distally.  No acute osseous abnormality, specifically, no evidence of osteomyelitis.  Moderate sized plantar calcaneal spur.  Original Report Authenticated By: Arnell Sieving, M.D.   I personally reviewed the CT scan  1. Diabetic foot infection   2. Uncontrolled diabetes mellitus   3. Cellulitis       MDM  Severe diabetic foot ulcerations bilaterally right greater than left with significant concern for potential loss of limb on the right secondary to severe infection.  Blood cultures taken Zosyn at this time.  Will omit to medicine for IV antibiotics and likely orthopedic consultation as the patient may benefit from partial amputation of the right foot.  The patient's care will be completed in the CDU by my physician assistant Ms. Trixie Dredge    [facility] to admit    Lyanne Co, MD 11/22/10 1954  Lyanne Co, MD 11/22/10 2256

## 2010-11-22 NOTE — ED Notes (Signed)
Patient transported to X-ray 

## 2010-11-22 NOTE — H&P (Signed)
PCP:  Cornell Barman at [facility]   Chief Complaint:  Foot ulcerations  HPI:  45yoF with h/o DM/neuropathy, HTN presents with bilateral foot ulcers, particularly severe on the  right.   Pt states she was Dx with DM 8 years ago but has not addressed it or taken any medications for it. She established care with a PCP a couple weeks ago and was given insulin to take, but didn't take that either. For the past few months, she had bunion like lesions on the plantar aspect of her 1st MTP's, and on the left over the past few weeks it's ulcerated. However, on the right for the past week the skin started sloughing off and bleeding. For this she presented to the ED.   In the ED Tmax was 100.7, pulse 113, BP stable. Labs showed Na 129, Cl 90, renal 9/0.69, glucose 420. WBC 11.2, Hct 28.3, Plts 435. BCx pending x2, Plain film showed marked soft tissue swelling and gas within soft tissue ulcers of dorsum of the foot distally.   Got 1L NS, Vanc/Zosyn, Tylenol, and subQ insulin. BS now down to 329.   She denies fevers, chills, sweats, feeling systemically ill, dizziness/lightheaded, chest pain/pressure, SOB, cough, GI symptoms of n/v/d/abd pain, dysuria. ROS o/w negative.    Past Medical History  Diagnosis Date  . Diabetes mellitus   . Hypertension   . Arthritis   . Neuropathy   . Diabetic foot ulcer associated with type 2 diabetes mellitus   . Intracerebral hemorrhage As a teenager     States she had burst blood vessel as teenager, now with resultant minor visual field and hearing deficits    Past Surgical History  Procedure Date  . Cesarean section     Medications:  HOME MEDS: Prior to Admission medications   Medication Sig Start Date End Date Taking? Authorizing Provider  aspirin EC 81 MG tablet Take 81 mg by mouth daily.      Historical Provider, MD  naproxen sodium (ANAPROX) 220 MG tablet Take 440 mg by mouth daily as needed.      Historical Provider, MD    Allergies:  No Known  Allergies  Social History:   reports that she has never smoked. She does not have any smokeless tobacco history on file. She reports that she does not drink alcohol or use illicit drugs.  Family History: Family History  Problem Relation Age of Onset  . Diabetes Mother   . Diabetes Father   . Heart disease Maternal Grandmother     Physical Exam: Filed Vitals:   11/22/10 1840 11/22/10 2217  BP: 149/78 144/82  Pulse: 113   Temp: 98.8 F (37.1 C) 100.7 F (38.2 C)  TempSrc: Oral Oral  Resp: 20 20  SpO2: 100% 100%   Blood pressure 144/82, pulse 113, temperature 100.7 F (38.2 C), temperature source Oral, resp. rate 20, last menstrual period 10/19/2010, SpO2 100.00%.  Gen: Overweight, pleasant, non-toxic appearing F in no distress, able to relate her history well,  no distress or pain HEENT: PERRL, EOMI, sclera/conjunctivae/irises clear, mouth moist, normal Lungs: CTAB now w/c/r, no adventitious sounds Heart: Tachycardic with systolic flow type murmur, but no gallops, otherwise normal Abd: Overweight, but soft, NT ND, benign Extrem: BUE's are normal, warm, well perfused. BLE's are very impressive. The LLE has a large  bunion type lesion on the plantar aspect of the 1st MTP with ulceration and clear fluid drainage,  no pus is expressed but there are bubbles coming out of the  ulcer when I press it. On the R foot,  it's even worse. There is broad exfoliation of sheets of her entire epidermis and possibly dermis  extending from the plantar aspect under the MTP's going around to the dorsum of her foot where  there is even more exfolation and broad ulceration of the entire overlying soft tissue. Under it  there is dense white soft tissue with areas of dark black necrotic areas. Going up towards the  legs bilaterally are dark purplish-red hues of erythema and soft pitting edema. She can plantar  and dorsiflex her ankles bilaterally and has sensation intact.    Labs & Imaging Results  for orders placed during the hospital encounter of 11/22/10 (from the past 48 hour(s))  GLUCOSE, CAPILLARY     Status: Abnormal   Collection Time   11/22/10  6:47 PM      Component Value Range Comment   Glucose-Capillary 459 (*) 70 - 99 (mg/dL)   CBC     Status: Abnormal   Collection Time   11/22/10  7:57 PM      Component Value Range Comment   WBC 11.2 (*) 4.0 - 10.5 (K/uL)    RBC 3.59 (*) 3.87 - 5.11 (MIL/uL)    Hemoglobin 9.6 (*) 12.0 - 15.0 (g/dL)    HCT 16.1 (*) 09.6 - 46.0 (%)    MCV 78.8  78.0 - 100.0 (fL)    MCH 26.7  26.0 - 34.0 (pg)    MCHC 33.9  30.0 - 36.0 (g/dL)    RDW 04.5 (*) 40.9 - 15.5 (%)    Platelets 435 (*) 150 - 400 (K/uL)   BASIC METABOLIC PANEL     Status: Abnormal   Collection Time   11/22/10  7:57 PM      Component Value Range Comment   Sodium 129 (*) 135 - 145 (mEq/L)    Potassium 3.8  3.5 - 5.1 (mEq/L)    Chloride 90 (*) 96 - 112 (mEq/L)    CO2 26  19 - 32 (mEq/L)    Glucose, Bld 420 (*) 70 - 99 (mg/dL)    BUN 9  6 - 23 (mg/dL)    Creatinine, Ser 8.11  0.50 - 1.10 (mg/dL)    Calcium 9.6  8.4 - 10.5 (mg/dL)    GFR calc non Af Amer >90  >90 (mL/min)    GFR calc Af Amer >90  >90 (mL/min)   GLUCOSE, CAPILLARY     Status: Abnormal   Collection Time   11/22/10 10:13 PM      Component Value Range Comment   Glucose-Capillary 329 (*) 70 - 99 (mg/dL)    Dg Foot Complete Right  11/22/2010  *RADIOLOGY REPORT*  Clinical Data: Diabetic foot ulcers.  RIGHT FOOT COMPLETE - 3+ VIEW 11/22/2010:  Comparison: None.  Findings: Marked soft tissue swelling and gas within the soft tissues of the dorsum of the foot overlying the second MTP joint. No radiographic evidence of underlying osteomyelitis.  Marked soft tissue swelling involving the great toe and second toe.  No evidence of acute, subacute, or healed fractures.  Bone mineral density well preserved.  Moderate sized plantar calcaneal spur. Enthesopathy at the insertion of the Achilles tendon on the posterior  calcaneus.  IMPRESSION: Marked soft tissue swelling and gas within soft tissue ulcers of the dorsum of the foot distally.  No acute osseous abnormality, specifically, no evidence of osteomyelitis.  Moderate sized plantar calcaneal spur.  Original Report Authenticated By: Arnell Sieving, M.D.  Impression Present on Admission:  .Diabetic foot ulcer associated with type 2 diabetes mellitus .Hypertension .Diabetes mellitus   45yoF with h/o DM/neuropathy, HTN presents with bilateral foot ulcers, particularly severe on the  right.   1. Severe diabetic foot ulcers, with SIRS criteria: On the left there is a plantar 1st MTP DM  ulcer, however on the right is an severe exfoliation and ulceration of her dorsal and plantar foot  overlying the MTP's. There was gas seen on the x-ray raising concern for more serious infection  which she'll need imaging for.   She has SIRS but is not in shock. S/p ABx in the ED which we'll continue.   - MRI bilateral feet, have contacted [facility] and appreciate recommendations.  Depending on extent of involvement, pt will need debridement vs frank amputation. - Vancomycin, Zosyn, and IVF's  - Blood cultures x2 are pending, culture for fever spikes - Wound consultation   2. Diabetes: Diagnosed 8 years ago after birth of her son, so ? initially gestational diabetes,  but she did not take care of this over the years.   - SSI while admitted, A1c  - Diabetes education   Regular bed, team [facility] 1  Full code, discussed with pt   Other plans as per orders.  Keri Tavella 11/22/2010, 11:45 PM

## 2010-11-23 ENCOUNTER — Encounter (HOSPITAL_COMMUNITY): Payer: Self-pay | Admitting: *Deleted

## 2010-11-23 ENCOUNTER — Inpatient Hospital Stay (HOSPITAL_COMMUNITY): Payer: 59

## 2010-11-23 LAB — CBC
HCT: 26.5 % — ABNORMAL LOW (ref 36.0–46.0)
Hemoglobin: 8.6 g/dL — ABNORMAL LOW (ref 12.0–15.0)
MCH: 25.7 pg — ABNORMAL LOW (ref 26.0–34.0)
MCHC: 32.5 g/dL (ref 30.0–36.0)
MCV: 79.3 fL (ref 78.0–100.0)
Platelets: 320 10*3/uL (ref 150–400)
RBC: 3.34 MIL/uL — ABNORMAL LOW (ref 3.87–5.11)
RDW: 18 % — ABNORMAL HIGH (ref 11.5–15.5)
WBC: 8.1 10*3/uL (ref 4.0–10.5)

## 2010-11-23 LAB — BASIC METABOLIC PANEL
BUN: 9 mg/dL (ref 6–23)
CO2: 24 mEq/L (ref 19–32)
Calcium: 8.7 mg/dL (ref 8.4–10.5)
Chloride: 95 mEq/L — ABNORMAL LOW (ref 96–112)
Creatinine, Ser: 0.67 mg/dL (ref 0.50–1.10)
GFR calc Af Amer: >90 mL/min (ref >90)
GFR calc non Af Amer: >90 mL/min (ref >90)
Glucose, Bld: 265 mg/dL — ABNORMAL HIGH (ref 70–99)
Potassium: 3.6 mEq/L (ref 3.5–5.1)
Sodium: 131 mEq/L — ABNORMAL LOW (ref 135–145)

## 2010-11-23 LAB — GLUCOSE, CAPILLARY
Glucose-Capillary: 273 mg/dL — ABNORMAL HIGH (ref 70–99)
Glucose-Capillary: 237 mg/dL — ABNORMAL HIGH (ref 70–99)
Glucose-Capillary: 305 mg/dL — ABNORMAL HIGH (ref 70–99)
Glucose-Capillary: 297 mg/dL — ABNORMAL HIGH (ref 70–99)
Glucose-Capillary: 353 mg/dL — ABNORMAL HIGH (ref 70–99)
Glucose-Capillary: 171 mg/dL — ABNORMAL HIGH (ref 70–99)

## 2010-11-23 LAB — HEMOGLOBIN A1C
Hgb A1c MFr Bld: 11.8 % — ABNORMAL HIGH (ref <5.7)
Hgb A1c MFr Bld: 12.4 % — ABNORMAL HIGH (ref <5.7)
Mean Plasma Glucose: 292 mg/dL — ABNORMAL HIGH (ref <117)
Mean Plasma Glucose: 309 mg/dL — ABNORMAL HIGH (ref <117)

## 2010-11-23 MED ORDER — DOCUSATE SODIUM 100 MG PO CAPS
100.0000 mg | ORAL_CAPSULE | Freq: Two times a day (BID) | ORAL | Status: DC
  Administered 2010-11-23 – 2010-11-26 (×7): 100 mg via ORAL
  Filled 2010-11-23 (×10): qty 1

## 2010-11-23 MED ORDER — ONDANSETRON HCL 4 MG/2ML IJ SOLN
4.0000 mg | Freq: Four times a day (QID) | INTRAMUSCULAR | Status: DC | PRN
  Administered 2010-11-24 – 2010-11-28 (×4): 4 mg via INTRAVENOUS
  Filled 2010-11-23 (×5): qty 2

## 2010-11-23 MED ORDER — OXYCODONE HCL 5 MG PO TABS
5.0000 mg | ORAL_TABLET | ORAL | Status: DC | PRN
  Administered 2010-11-23 – 2010-11-28 (×8): 5 mg via ORAL
  Filled 2010-11-23 (×8): qty 1

## 2010-11-23 MED ORDER — HEPARIN SODIUM (PORCINE) 5000 UNIT/ML IJ SOLN
5000.0000 [IU] | Freq: Three times a day (TID) | INTRAMUSCULAR | Status: DC
  Administered 2010-11-23 – 2010-11-24 (×4): 5000 [IU] via SUBCUTANEOUS
  Filled 2010-11-23 (×8): qty 1

## 2010-11-23 MED ORDER — SODIUM CHLORIDE 0.9 % IV SOLN: INTRAVENOUS | Status: DC

## 2010-11-23 MED ORDER — ACETAMINOPHEN 650 MG RE SUPP
650.0000 mg | Freq: Four times a day (QID) | RECTAL | Status: DC | PRN
  Administered 2010-11-24: 650 mg via RECTAL
  Filled 2010-11-23: qty 1

## 2010-11-23 MED ORDER — PIPERACILLIN-TAZOBACTAM 3.375 G IVPB
3.3750 g | Freq: Three times a day (TID) | INTRAVENOUS | Status: DC
  Administered 2010-11-23 – 2010-11-28 (×16): 3.375 g via INTRAVENOUS
  Filled 2010-11-23 (×19): qty 50

## 2010-11-23 MED ORDER — INSULIN ASPART 100 UNIT/ML ~~LOC~~ SOLN
0.0000 [IU] | Freq: Three times a day (TID) | SUBCUTANEOUS | Status: DC
  Administered 2010-11-23: 15 [IU] via SUBCUTANEOUS
  Administered 2010-11-24: 3 [IU] via SUBCUTANEOUS
  Administered 2010-11-24: 5 [IU] via SUBCUTANEOUS
  Administered 2010-11-25 (×2): 3 [IU] via SUBCUTANEOUS
  Administered 2010-11-25: 5 [IU] via SUBCUTANEOUS
  Administered 2010-11-26 (×2): 2 [IU] via SUBCUTANEOUS
  Administered 2010-11-26: 3 [IU] via SUBCUTANEOUS
  Administered 2010-11-29 (×2): 2 [IU] via SUBCUTANEOUS
  Filled 2010-11-23: qty 3

## 2010-11-23 MED ORDER — INSULIN GLARGINE 100 UNIT/ML ~~LOC~~ SOLN
15.0000 [IU] | Freq: Every day | SUBCUTANEOUS | Status: DC
  Administered 2010-11-23 – 2010-11-29 (×6): 15 [IU] via SUBCUTANEOUS
  Filled 2010-11-23: qty 3

## 2010-11-23 MED ORDER — LIVING WELL WITH DIABETES BOOK
Freq: Once | Status: AC
  Administered 2010-11-23: 22:00:00
  Filled 2010-11-23 (×2): qty 1

## 2010-11-23 MED ORDER — CEFAZOLIN SODIUM-DEXTROSE 2-3 GM-% IV SOLR
2.0000 g | INTRAVENOUS | Status: DC
  Filled 2010-11-23 (×2): qty 50

## 2010-11-23 MED ORDER — CHLORHEXIDINE GLUCONATE 4 % EX LIQD
60.0000 mL | Freq: Once | CUTANEOUS | Status: AC
  Administered 2010-11-23: 4 via TOPICAL
  Filled 2010-11-23: qty 60

## 2010-11-23 MED ORDER — INSULIN ASPART 100 UNIT/ML ~~LOC~~ SOLN
0.0000 [IU] | Freq: Every day | SUBCUTANEOUS | Status: DC
  Administered 2010-11-24: 2 [IU] via SUBCUTANEOUS

## 2010-11-23 MED ORDER — BD GETTING STARTED TAKE HOME KIT: 3/10ML X 30G SYRINGES
1.0000 | Freq: Once | Status: AC
  Administered 2010-11-23: 1
  Filled 2010-11-23 (×2): qty 1

## 2010-11-23 MED ORDER — SENNA 8.6 MG PO TABS
2.0000 | ORAL_TABLET | Freq: Every day | ORAL | Status: DC | PRN
  Filled 2010-11-23: qty 2

## 2010-11-23 MED ORDER — SODIUM CHLORIDE 0.9 % IV SOLN
INTRAVENOUS | Status: DC
  Administered 2010-11-23: 22:00:00 via INTRAVENOUS

## 2010-11-23 MED ORDER — VANCOMYCIN HCL IN DEXTROSE 1-5 GM/200ML-% IV SOLN
1000.0000 mg | Freq: Three times a day (TID) | INTRAVENOUS | Status: DC
  Administered 2010-11-23 – 2010-11-24 (×5): 1000 mg via INTRAVENOUS
  Filled 2010-11-23 (×6): qty 200

## 2010-11-23 MED ORDER — ONDANSETRON HCL 4 MG PO TABS: 4.0000 mg | ORAL_TABLET | Freq: Four times a day (QID) | ORAL | Status: DC | PRN

## 2010-11-23 MED ORDER — ACETAMINOPHEN 325 MG PO TABS
650.0000 mg | ORAL_TABLET | Freq: Four times a day (QID) | ORAL | Status: DC | PRN
  Administered 2010-11-22 – 2010-11-28 (×7): 650 mg via ORAL
  Filled 2010-11-23 (×7): qty 2

## 2010-11-23 NOTE — Plan of Care (Signed)
Problem: Phase I Progression Outcomes Goal: OOB as tolerated unless otherwise ordered Outcome: Not Applicable Date Met:  11/23/10 Pt is on bedrest

## 2010-11-23 NOTE — Progress Notes (Signed)
Subjective: Patient seen and examined, denies any complaints.  Objective: Vital signs in last 24 hours: Temp:  [98.4 F (36.9 C)-100.7 F (38.2 C)] 98.4 F (36.9 C) (11/19 1426) Pulse Rate:  [96-113] 96  (11/19 1426) Resp:  [16-20] 16  (11/19 1426) BP: (128-149)/(65-91) 142/91 mmHg (11/19 1426) SpO2:  [95 %-100 %] 99 % (11/19 1426) Weight:  [90.719 kg (200 lb)] 200 lb (90.719 kg) (11/18 2355) Weight change:  Last BM Date: 11/21/10  Intake/Output from previous day:       Physical Exam: General: Alert, awake, oriented x3, in no acute distress. Heart: Regular rate and rhythm, without murmurs, rubs, gallops. Lungs: Clear to auscultation bilaterally. Abdomen: Soft, nontender, nondistended, positive bowel sounds. Extremities: Feet dressing in place, erythema, warmth noted at the mid shin. See H&P for more details Lab Results: Results for orders placed during the hospital encounter of 11/22/10 (from the past 24 hour(s))  GLUCOSE, CAPILLARY     Status: Abnormal   Collection Time   11/22/10  6:47 PM      Component Value Range   Glucose-Capillary 459 (*) 70 - 99 (mg/dL)  CBC     Status: Abnormal   Collection Time   11/22/10  7:57 PM      Component Value Range   WBC 11.2 (*) 4.0 - 10.5 (K/uL)   RBC 3.59 (*) 3.87 - 5.11 (MIL/uL)   Hemoglobin 9.6 (*) 12.0 - 15.0 (g/dL)   HCT 08.6 (*) 57.8 - 46.0 (%)   MCV 78.8  78.0 - 100.0 (fL)   MCH 26.7  26.0 - 34.0 (pg)   MCHC 33.9  30.0 - 36.0 (g/dL)   RDW 46.9 (*) 62.9 - 15.5 (%)   Platelets 435 (*) 150 - 400 (K/uL)  HEMOGLOBIN A1C     Status: Abnormal   Collection Time   11/22/10  7:57 PM      Component Value Range   Hemoglobin A1C 11.8 (*) <5.7 (%)   Mean Plasma Glucose 292 (*) <117 (mg/dL)  BASIC METABOLIC PANEL     Status: Abnormal   Collection Time   11/22/10  7:57 PM      Component Value Range   Sodium 129 (*) 135 - 145 (mEq/L)   Potassium 3.8  3.5 - 5.1 (mEq/L)   Chloride 90 (*) 96 - 112 (mEq/L)   CO2 26  19 - 32 (mEq/L)     Glucose, Bld 420 (*) 70 - 99 (mg/dL)   BUN 9  6 - 23 (mg/dL)   Creatinine, Ser 5.28  0.50 - 1.10 (mg/dL)   Calcium 9.6  8.4 - 41.3 (mg/dL)   GFR calc non Af Amer >90  >90 (mL/min)   GFR calc Af Amer >90  >90 (mL/min)  GLUCOSE, CAPILLARY     Status: Abnormal   Collection Time   11/22/10 10:13 PM      Component Value Range   Glucose-Capillary 329 (*) 70 - 99 (mg/dL)  HEMOGLOBIN K4M     Status: Abnormal   Collection Time   11/23/10 12:51 AM      Component Value Range   Hemoglobin A1C 12.4 (*) <5.7 (%)   Mean Plasma Glucose 309 (*) <117 (mg/dL)  CBC     Status: Abnormal   Collection Time   11/23/10 12:51 AM      Component Value Range   WBC 8.1  4.0 - 10.5 (K/uL)   RBC 3.34 (*) 3.87 - 5.11 (MIL/uL)   Hemoglobin 8.6 (*) 12.0 - 15.0 (g/dL)  HCT 26.5 (*) 36.0 - 46.0 (%)   MCV 79.3  78.0 - 100.0 (fL)   MCH 25.7 (*) 26.0 - 34.0 (pg)   MCHC 32.5  30.0 - 36.0 (g/dL)   RDW 16.1 (*) 09.6 - 15.5 (%)   Platelets 320  150 - 400 (K/uL)  BASIC METABOLIC PANEL     Status: Abnormal   Collection Time   11/23/10 12:51 AM      Component Value Range   Sodium 131 (*) 135 - 145 (mEq/L)   Potassium 3.6  3.5 - 5.1 (mEq/L)   Chloride 95 (*) 96 - 112 (mEq/L)   CO2 24  19 - 32 (mEq/L)   Glucose, Bld 265 (*) 70 - 99 (mg/dL)   BUN 9  6 - 23 (mg/dL)   Creatinine, Ser 0.45  0.50 - 1.10 (mg/dL)   Calcium 8.7  8.4 - 40.9 (mg/dL)   GFR calc non Af Amer >90  >90 (mL/min)   GFR calc Af Amer >90  >90 (mL/min)  GLUCOSE, CAPILLARY     Status: Abnormal   Collection Time   11/23/10  2:32 AM      Component Value Range   Glucose-Capillary 273 (*) 70 - 99 (mg/dL)  GLUCOSE, CAPILLARY     Status: Abnormal   Collection Time   11/23/10  5:55 AM      Component Value Range   Glucose-Capillary 237 (*) 70 - 99 (mg/dL)  GLUCOSE, CAPILLARY     Status: Abnormal   Collection Time   11/23/10  8:18 AM      Component Value Range   Glucose-Capillary 305 (*) 70 - 99 (mg/dL)  GLUCOSE, CAPILLARY     Status: Abnormal    Collection Time   11/23/10 11:54 AM      Component Value Range   Glucose-Capillary 297 (*) 70 - 99 (mg/dL)    Studies/Results: Dg Foot Complete Right   IMPRESSION: Marked soft tissue swelling and gas within soft tissue ulcers of the dorsum of the foot distally.  No acute osseous abnormality, specifically, no evidence of osteomyelitis.  Moderate sized plantar calcaneal spur.  Original Report Authenticated By: Arnell Sieving, M.D.    Medications:    . bd getting started take home kit  1 kit Other Once  . docusate sodium  100 mg Oral BID  . heparin  5,000 Units Subcutaneous Q8H  . insulin aspart      . living well with diabetes book   Does not apply Once  . piperacillin-tazobactam (ZOSYN)  IV  3.375 g Intravenous Once  . piperacillin-tazobactam (ZOSYN)  IV  3.375 g Intravenous Q8H  . sodium chloride  1,000 mL Intravenous Once  . vancomycin  1,000 mg Intravenous Once  . vancomycin  1,000 mg Intravenous Q8H  . DISCONTD: insulin aspart  4 Units Subcutaneous Once  . DISCONTD: insulin regular  4 Units Subcutaneous Once  . DISCONTD: insulin regular  4 Units Subcutaneous Once    acetaminophen, acetaminophen, ondansetron (ZOFRAN) IV, ondansetron, oxyCODONE, senna     . sodium chloride      Assessment/Plan:  Principal Problem:  *Diabetic foot ulcer associated with type 2 diabetes mellitus Active Problems:  Hypertension  Diabetes mellitus Plan Appreciate orthopedics input and possible surgery in the a.m.  MRI left than the right foot pending, discussed with radiology will order for skull x-ray to rule out any intracranial metals or stents. Continue antibiotics, on Zosyn and vancomycin. We'll add Lantus insulin 15 units daily and moderate insulin sliding  scale as per diabetic coordinator recommendation.   LOS: 1 day   Kamayah Pillay 11/23/2010, 3:46 PM

## 2010-11-23 NOTE — Consult Note (Addendum)
Reason for Consult:right foot infection, left foot ulcer Referring Physician: Dr. Wilber Oliphant is an 45 y.o. female.  HPI: Pt presented to the ER with bilat foot swelling, pain, redness and drainage from the right foot.  Started on abx.  Pt has had callouses and ulcers in the past but never infection.  No h/o prev. Amputation. DMII for 8 yrs.  No smoking or thyroid trouble.  Pt denies recent f/c/n/v/wt loss.  She does c/o bilat burning pain in feet for several years.  Past Medical History  Diagnosis Date  . Diabetes mellitus   . Hypertension   . Arthritis   . Neuropathy   . Diabetic foot ulcer associated with type 2 diabetes mellitus   . Intracerebral hemorrhage As a teenager     States she had burst blood vessel as teenager, now with resultant minor visual field and hearing deficits    Past Surgical History  Procedure Date  . Cesarean section     Family History  Problem Relation Age of Onset  . Diabetes Mother   . Diabetes Father   . Heart disease Maternal Grandmother     Social History:  reports that she has never smoked. She does not have any smokeless tobacco history on file. She reports that she does not drink alcohol or use illicit drugs.  Allergies: No Known Allergies  Medications: I have reviewed the patient's current medications.  Results for orders placed during the hospital encounter of 11/22/10 (from the past 48 hour(s))  GLUCOSE, CAPILLARY     Status: Abnormal   Collection Time   11/22/10  6:47 PM      Component Value Range Comment   Glucose-Capillary 459 (*) 70 - 99 (mg/dL)   CBC     Status: Abnormal   Collection Time   11/22/10  7:57 PM      Component Value Range Comment   WBC 11.2 (*) 4.0 - 10.5 (K/uL)    RBC 3.59 (*) 3.87 - 5.11 (MIL/uL)    Hemoglobin 9.6 (*) 12.0 - 15.0 (g/dL)    HCT 82.9 (*) 56.2 - 46.0 (%)    MCV 78.8  78.0 - 100.0 (fL)    MCH 26.7  26.0 - 34.0 (pg)    MCHC 33.9  30.0 - 36.0 (g/dL)    RDW 13.0 (*) 86.5 - 15.5 (%)     Platelets 435 (*) 150 - 400 (K/uL)   BASIC METABOLIC PANEL     Status: Abnormal   Collection Time   11/22/10  7:57 PM      Component Value Range Comment   Sodium 129 (*) 135 - 145 (mEq/L)    Potassium 3.8  3.5 - 5.1 (mEq/L)    Chloride 90 (*) 96 - 112 (mEq/L)    CO2 26  19 - 32 (mEq/L)    Glucose, Bld 420 (*) 70 - 99 (mg/dL)    BUN 9  6 - 23 (mg/dL)    Creatinine, Ser 7.84  0.50 - 1.10 (mg/dL)    Calcium 9.6  8.4 - 10.5 (mg/dL)    GFR calc non Af Amer >90  >90 (mL/min)    GFR calc Af Amer >90  >90 (mL/min)   GLUCOSE, CAPILLARY     Status: Abnormal   Collection Time   11/22/10 10:13 PM      Component Value Range Comment   Glucose-Capillary 329 (*) 70 - 99 (mg/dL)   CBC     Status: Abnormal   Collection Time  11/23/10 12:51 AM      Component Value Range Comment   WBC 8.1  4.0 - 10.5 (K/uL)    RBC 3.34 (*) 3.87 - 5.11 (MIL/uL)    Hemoglobin 8.6 (*) 12.0 - 15.0 (g/dL)    HCT 16.1 (*) 09.6 - 46.0 (%)    MCV 79.3  78.0 - 100.0 (fL)    MCH 25.7 (*) 26.0 - 34.0 (pg)    MCHC 32.5  30.0 - 36.0 (g/dL)    RDW 04.5 (*) 40.9 - 15.5 (%)    Platelets 320  150 - 400 (K/uL) DELTA CHECK NOTED  BASIC METABOLIC PANEL     Status: Abnormal   Collection Time   11/23/10 12:51 AM      Component Value Range Comment   Sodium 131 (*) 135 - 145 (mEq/L)    Potassium 3.6  3.5 - 5.1 (mEq/L)    Chloride 95 (*) 96 - 112 (mEq/L)    CO2 24  19 - 32 (mEq/L)    Glucose, Bld 265 (*) 70 - 99 (mg/dL)    BUN 9  6 - 23 (mg/dL)    Creatinine, Ser 8.11  0.50 - 1.10 (mg/dL)    Calcium 8.7  8.4 - 10.5 (mg/dL)    GFR calc non Af Amer >90  >90 (mL/min)    GFR calc Af Amer >90  >90 (mL/min)   GLUCOSE, CAPILLARY     Status: Abnormal   Collection Time   11/23/10  2:32 AM      Component Value Range Comment   Glucose-Capillary 273 (*) 70 - 99 (mg/dL)   GLUCOSE, CAPILLARY     Status: Abnormal   Collection Time   11/23/10  5:55 AM      Component Value Range Comment   Glucose-Capillary 237 (*) 70 - 99 (mg/dL)     GLUCOSE, CAPILLARY     Status: Abnormal   Collection Time   11/23/10  8:18 AM      Component Value Range Comment   Glucose-Capillary 305 (*) 70 - 99 (mg/dL)   GLUCOSE, CAPILLARY     Status: Abnormal   Collection Time   11/23/10 11:54 AM      Component Value Range Comment   Glucose-Capillary 297 (*) 70 - 99 (mg/dL)     Dg Foot Complete Right  11/22/2010  *RADIOLOGY REPORT*  Clinical Data: Diabetic foot ulcers.  RIGHT FOOT COMPLETE - 3+ VIEW 11/22/2010:  Comparison: None.  Findings: Marked soft tissue swelling and gas within the soft tissues of the dorsum of the foot overlying the second MTP joint. No radiographic evidence of underlying osteomyelitis.  Marked soft tissue swelling involving the great toe and second toe.  No evidence of acute, subacute, or healed fractures.  Bone mineral density well preserved.  Moderate sized plantar calcaneal spur. Enthesopathy at the insertion of the Achilles tendon on the posterior calcaneus.  IMPRESSION: Marked soft tissue swelling and gas within soft tissue ulcers of the dorsum of the foot distally.  No acute osseous abnormality, specifically, no evidence of osteomyelitis.  Moderate sized plantar calcaneal spur.  Original Report Authenticated By: Arnell Sieving, M.D.    ROS  As above and o/w neg. Blood pressure 145/73, pulse 104, temperature 98.6 F (37 C), temperature source Oral, resp. rate 18, height 5\' 7"  (1.702 m), weight 90.719 kg (200 lb), last menstrual period 10/19/2010, SpO2 100.00%. Physical Exam  WN WD woman in nad.  A and O x 4.  Mood normal. Affect flat.  EOMI.  Resp unlabored.  R foot with full thickness soft tissue destruction both dorsally and plantarly at 1st webspace.  Significant serous drainage.  Liquefaction necrosis present there and under 1st MTP joint.  Swelling and mild cellulitis sxs.  Brisk cap refill in toes.  L foot with 1 cm ulcer under 1xt MT head.  Assessment/Plan: R forefoot wet gangrene.  Based on the appearnace of  the foot, I believe it's extremely unlikely to salvage her forefoot.  I think it's very likely that she'll require a trans met amputation with PTAL.  I think she still needs an MRI of the foot to define the extent of any osteo.  I'm going to try to post her for the OR tomorrow.  L forefoot diabetic ulcer.  Continue local wound care.  [name] 11/23/2010, 1:35 PM    Per radiology, pt is not a candidate for and MRI since she has an aneurysm clip in her brain.  Since we can't get additional imaging to define any osteo, I believe it's necessary to proceed with surgical treatment.  Given the amount of forefoot gangrene and soft tissue desctruction, I believe individual toe amputation will not likely be successful.  I believe a transmet amputation with achilles tendon lengthening will be the most likely level to heal successfully.  I've explained the risks and benefits of the relevant treatment options to the patient in detail.  She understands and wants to proceed with surgical treatment tomorrow.

## 2010-11-23 NOTE — Progress Notes (Signed)
Inpatient Diabetes Program Recommendations  AACE/ADA: New Consensus Statement on Inpatient Glycemic Control (2009)  Target Ranges:  Prepandial:   less than 140 mg/dL      Peak postprandial:   less than 180 mg/dL (1-2 hours)      Critically ill patients:  140 - 180 mg/dL   Reason for Visit:  Note patient with elevated CBG's and foot ulcers on bilateral feet.  She states she had gestational diabetes 8 years ago and was on insulin however was then on pills.  She has not been taking pills.  She was seen by Dr. Jillyn Hidden last week and was prescribed Lantus 15 units daily, however she did not take due to fears of going low.     Inpatient Diabetes Program Recommendations Insulin - Basal: Add Lantus 15 units daily. Correction (SSI): Add Novolog moderate correction tid with meals HgbA1C: Please order A1c to assess past 2-3 month glycemic control.  Note: Needs lots of education.  Showed her how to watch diabetes videos.  Will order "Choose to live" booklet. Discussed with RN.  Will follow.

## 2010-11-23 NOTE — Progress Notes (Signed)
ANTIBIOTIC CONSULT NOTE - INITIAL  Pharmacy Consult for Vancomcyin and Zosyn Indication: foot ulcers/fever  No Known Allergies  Patient Measurements: Height: 5\' 7"  (170.2 cm) Weight: 200 lb (90.719 kg) IBW/kg (Calculated) : 61.6   Vital Signs: Temp: 99.2 F (37.3 C) (11/18 2355) Temp src: Oral (11/18 2355) BP: 128/65 mmHg (11/18 2355) Pulse Rate: 103  (11/18 2355)  Labs:  [facility] 11/22/10 1957  WBC 11.2*  HGB 9.6*  PLT 435*  LABCREA --  CREATININE 0.69   Estimated Creatinine Clearance: 103.7 ml/min (by C-G formula based on Cr of 0.69).   Microbiology: No results found for this or any previous visit (from the past 720 hour(s)).  Medical History: Past Medical History  Diagnosis Date  . Diabetes mellitus   . Hypertension   . Arthritis   . Neuropathy   . Diabetic foot ulcer associated with type 2 diabetes mellitus   . Intracerebral hemorrhage As a teenager     States she had burst blood vessel as teenager, now with resultant minor visual field and hearing deficits    Medications:  Prescriptions prior to admission  Medication Sig Dispense Refill  . aspirin EC 81 MG tablet Take 81 mg by mouth daily.        . naproxen sodium (ANAPROX) 220 MG tablet Take 440 mg by mouth daily as needed.         Assessment: 45 y.o female presents with diabetic ulcers and temp 100.7. To begin broad spectrum antibiotics. Vanco 1gm IV given in ED at 1945.  Goal of Therapy:  Vancomycin trough level 10-15 mcg/ml  Plan:  1. Vancomycin 1gm IV q8h. Next dose at 0600 2. Zosyn 3.375gm IV q8h. First dose now 3. F/u microbiological data and renal function [name], Hilario Quarry 11/23/2010,12:51 AM

## 2010-11-23 NOTE — Consult Note (Signed)
WOC consult Note Reason for Consult: Consult requested or BLE wounds. Wound type: Let foot with callous and blister which has ruptured to plantar surface near great toe. Conservative sharp wound debridement (CSWD performed at the bedside:   Removed peeling skin of previous blister with scissors and forcepeps, pt tolerated without pain or bleeding. Measurement: 2X2X.3cm after debridement.  Inner wound bed pink, minimal blood-tinged drainage, no odor.  Callous area surrounding.: Plan:  Aquacel to absorb drainage and provide antimicrobial benefits.  Right foot with extensive necrotic tissue to anterior and plantar surfaces.  10X10 cm affected area, 100% yellow slough, large amt tan drainage with strong odor, fuctuant when probed, bone palpable with swab.  This is beyond [facility] scope of practice R/T extensive nonviable tissue.   RECOMMEND ORTHO CONSULT or debridement and further plan of care, topical care will not be effective in promoting healing. Plan:  Moist gauze dressing until further plan of care available. Pt could benefit from  ABI to assess perfusion.   Cammie Mcgee, RN, MSN, [employer]  [phone]

## 2010-11-23 NOTE — Progress Notes (Signed)
Utilization review complete 

## 2010-11-24 ENCOUNTER — Other Ambulatory Visit: Payer: Self-pay

## 2010-11-24 ENCOUNTER — Inpatient Hospital Stay (HOSPITAL_COMMUNITY): Payer: 59 | Admitting: Anesthesiology

## 2010-11-24 ENCOUNTER — Inpatient Hospital Stay (HOSPITAL_COMMUNITY): Payer: 59

## 2010-11-24 ENCOUNTER — Encounter (HOSPITAL_COMMUNITY): Payer: Self-pay | Admitting: Anesthesiology

## 2010-11-24 ENCOUNTER — Other Ambulatory Visit: Payer: Self-pay | Admitting: Orthopedic Surgery

## 2010-11-24 ENCOUNTER — Encounter (HOSPITAL_COMMUNITY)
Admission: EM | Disposition: A | Discharge status: Home Health | Payer: Self-pay | Source: Home / Self Care | Attending: Internal Medicine

## 2010-11-24 HISTORY — PX: AMPUTATION: SHX166

## 2010-11-24 HISTORY — PX: I&D EXTREMITY: SHX5045

## 2010-11-24 LAB — BASIC METABOLIC PANEL
BUN: 7 mg/dL (ref 6–23)
CO2: 27 mEq/L (ref 19–32)
Calcium: 8.7 mg/dL (ref 8.4–10.5)
Chloride: 98 mEq/L (ref 96–112)
Creatinine, Ser: 0.8 mg/dL (ref 0.50–1.10)
GFR calc Af Amer: >90 mL/min (ref >90)
GFR calc non Af Amer: 88 mL/min — ABNORMAL LOW (ref >90)
Glucose, Bld: 193 mg/dL — ABNORMAL HIGH (ref 70–99)
Potassium: 3.5 mEq/L (ref 3.5–5.1)
Sodium: 134 mEq/L — ABNORMAL LOW (ref 135–145)

## 2010-11-24 LAB — URINALYSIS, ROUTINE W REFLEX MICROSCOPIC
Bilirubin Urine: NEGATIVE
Glucose, UA: NEGATIVE mg/dL
Hgb urine dipstick: NEGATIVE
Ketones, ur: NEGATIVE mg/dL
Leukocytes, UA: NEGATIVE
Nitrite: NEGATIVE
Protein, ur: NEGATIVE mg/dL
Specific Gravity, Urine: 1.006 (ref 1.005–1.030)
Urobilinogen, UA: 2 mg/dL — ABNORMAL HIGH (ref 0.0–1.0)
pH: 7 (ref 5.0–8.0)

## 2010-11-24 LAB — GLUCOSE, CAPILLARY
Glucose-Capillary: 241 mg/dL — ABNORMAL HIGH (ref 70–99)
Glucose-Capillary: 194 mg/dL — ABNORMAL HIGH (ref 70–99)
Glucose-Capillary: 223 mg/dL — ABNORMAL HIGH (ref 70–99)
Glucose-Capillary: 174 mg/dL — ABNORMAL HIGH (ref 70–99)
Glucose-Capillary: 220 mg/dL — ABNORMAL HIGH (ref 70–99)

## 2010-11-24 LAB — CBC
HCT: 28.8 % — ABNORMAL LOW (ref 36.0–46.0)
Hemoglobin: 9.2 g/dL — ABNORMAL LOW (ref 12.0–15.0)
MCH: 25.9 pg — ABNORMAL LOW (ref 26.0–34.0)
MCHC: 31.9 g/dL (ref 30.0–36.0)
MCV: 81.1 fL (ref 78.0–100.0)
Platelets: 389 10*3/uL (ref 150–400)
RBC: 3.55 MIL/uL — ABNORMAL LOW (ref 3.87–5.11)
RDW: 18.4 % — ABNORMAL HIGH (ref 11.5–15.5)
WBC: 8.1 10*3/uL (ref 4.0–10.5)

## 2010-11-24 SURGERY — AMPUTATION, FOOT, PARTIAL
Anesthesia: Choice | Site: Foot | Laterality: Right

## 2010-11-24 MED ORDER — DROPERIDOL 2.5 MG/ML IJ SOLN
0.6250 mg | INTRAMUSCULAR | Status: DC | PRN
  Filled 2010-11-24: qty 0.25

## 2010-11-24 MED ORDER — HEPARIN SODIUM (PORCINE) 5000 UNIT/ML IJ SOLN
5000.0000 [IU] | Freq: Three times a day (TID) | INTRAMUSCULAR | Status: DC
  Administered 2010-11-25 – 2010-11-29 (×13): 5000 [IU] via SUBCUTANEOUS
  Filled 2010-11-24 (×16): qty 1

## 2010-11-24 MED ORDER — OXYMETAZOLINE HCL 0.05 % NA SOLN
2.0000 | Freq: Once | NASAL | Status: DC
  Filled 2010-11-24: qty 15

## 2010-11-24 MED ORDER — PROPOFOL 10 MG/ML IV EMUL
INTRAVENOUS | Status: DC | PRN
  Administered 2010-11-24: 180 mg via INTRAVENOUS

## 2010-11-24 MED ORDER — LACTATED RINGERS IV SOLN: 500.0000 mL | INTRAVENOUS | Status: DC

## 2010-11-24 MED ORDER — BUPIVACAINE-EPINEPHRINE PF 0.5-1:200000 % IJ SOLN
INTRAMUSCULAR | Status: DC | PRN
  Administered 2010-11-24: 150 mg

## 2010-11-24 MED ORDER — ATROPINE SULFATE 0.4 MG/ML IJ SOLN
0.4000 mg | Freq: Once | INTRAMUSCULAR | Status: DC | PRN
  Filled 2010-11-24: qty 1

## 2010-11-24 MED ORDER — HYDROMORPHONE HCL PF 1 MG/ML IJ SOLN: 0.2500 mg | INTRAMUSCULAR | Status: DC | PRN

## 2010-11-24 MED ORDER — SODIUM CHLORIDE 0.9 % IV SOLN
INTRAVENOUS | Status: DC
  Administered 2010-11-25: 08:00:00 via INTRAVENOUS

## 2010-11-24 MED ORDER — MIDAZOLAM HCL 2 MG/2ML IJ SOLN: 0.5000 mg | INTRAMUSCULAR | Status: DC | PRN

## 2010-11-24 MED ORDER — FENTANYL CITRATE 0.05 MG/ML IJ SOLN: 50.0000 ug | INTRAMUSCULAR | Status: DC | PRN

## 2010-11-24 MED ORDER — LACTATED RINGERS IV SOLN
INTRAVENOUS | Status: DC
  Administered 2010-11-24 (×2): via INTRAVENOUS

## 2010-11-24 MED ORDER — CEFAZOLIN SODIUM 1-5 GM-% IV SOLN
INTRAVENOUS | Status: AC
  Filled 2010-11-24: qty 100

## 2010-11-24 MED ORDER — FENTANYL CITRATE 0.05 MG/ML IJ SOLN
INTRAMUSCULAR | Status: DC | PRN
  Administered 2010-11-24: 100 ug via INTRAVENOUS
  Administered 2010-11-24: 150 ug via INTRAVENOUS
  Administered 2010-11-24: 50 ug via INTRAVENOUS

## 2010-11-24 MED ORDER — MIDAZOLAM HCL 2 MG/2ML IJ SOLN: 1.0000 mg | INTRAMUSCULAR | Status: DC | PRN

## 2010-11-24 MED ORDER — GLYCOPYRROLATE 0.2 MG/ML IJ SOLN
0.2000 mg | Freq: Once | INTRAMUSCULAR | Status: DC | PRN
  Filled 2010-11-24: qty 1

## 2010-11-24 MED ORDER — MIDAZOLAM HCL 5 MG/5ML IJ SOLN
INTRAMUSCULAR | Status: DC | PRN
  Administered 2010-11-24: 2 mg via INTRAVENOUS

## 2010-11-24 MED ORDER — LIDOCAINE-PRILOCAINE 2.5-2.5 % EX CREA
1.0000 "application " | TOPICAL_CREAM | Freq: Once | CUTANEOUS | Status: DC
  Filled 2010-11-24: qty 5

## 2010-11-24 MED ORDER — VANCOMYCIN HCL IN DEXTROSE 1-5 GM/200ML-% IV SOLN
1000.0000 mg | Freq: Three times a day (TID) | INTRAVENOUS | Status: DC
  Administered 2010-11-24 – 2010-11-25 (×3): 1000 mg via INTRAVENOUS
  Filled 2010-11-24 (×4): qty 200

## 2010-11-24 MED ORDER — ACETAMINOPHEN 500 MG PO TABS
500.0000 mg | ORAL_TABLET | Freq: Once | ORAL | Status: AC
  Administered 2010-11-24: 500 mg via ORAL
  Filled 2010-11-24: qty 1

## 2010-11-24 MED ORDER — BACITRACIN ZINC 500 UNIT/GM EX OINT
TOPICAL_OINTMENT | CUTANEOUS | Status: DC | PRN
  Administered 2010-11-24: 1 via TOPICAL

## 2010-11-24 SURGICAL SUPPLY — 43 items
BANDAGE ELASTIC 4 VELCRO ST LF (GAUZE/BANDAGES/DRESSINGS) ×3 IMPLANT
BANDAGE ELASTIC 6 VELCRO ST LF (GAUZE/BANDAGES/DRESSINGS) ×3 IMPLANT
BLADE SAW SGTL MED 73X18.5 STR (BLADE) IMPLANT
BLADE SURG 10 STRL SS (BLADE) ×3 IMPLANT
BNDG COHESIVE 4X5 TAN STRL (GAUZE/BANDAGES/DRESSINGS) ×3 IMPLANT
BNDG COHESIVE 6X5 TAN STRL LF (GAUZE/BANDAGES/DRESSINGS) ×3 IMPLANT
BNDG ESMARK 4X9 LF (GAUZE/BANDAGES/DRESSINGS) ×3 IMPLANT
CLOTH BEACON ORANGE TIMEOUT ST (SAFETY) ×3 IMPLANT
CUFF TOURNIQUET SINGLE 34IN LL (TOURNIQUET CUFF) ×3 IMPLANT
CUFF TOURNIQUET SINGLE 44IN (TOURNIQUET CUFF) IMPLANT
DRAPE U-SHAPE 47X51 STRL (DRAPES) ×6 IMPLANT
DRSG ADAPTIC 3X8 NADH LF (GAUZE/BANDAGES/DRESSINGS) ×3 IMPLANT
DRSG PAD ABDOMINAL 8X10 ST (GAUZE/BANDAGES/DRESSINGS) ×9 IMPLANT
DURAPREP 26ML APPLICATOR (WOUND CARE) ×3 IMPLANT
ELECT REM PT RETURN 9FT ADLT (ELECTROSURGICAL) ×3
ELECTRODE REM PT RTRN 9FT ADLT (ELECTROSURGICAL) ×2 IMPLANT
GAUZE KERLIX 2  STERILE LF (GAUZE/BANDAGES/DRESSINGS) ×3 IMPLANT
GLOVE BIO SURGEON STRL SZ8 (GLOVE) ×6 IMPLANT
GLOVE BIOGEL PI IND STRL 8 (GLOVE) ×2 IMPLANT
GLOVE BIOGEL PI INDICATOR 8 (GLOVE) ×1
GOWN PREVENTION PLUS XLARGE (GOWN DISPOSABLE) ×3 IMPLANT
GOWN STRL NON-REIN LRG LVL3 (GOWN DISPOSABLE) ×3 IMPLANT
KIT BASIN OR (CUSTOM PROCEDURE TRAY) ×3 IMPLANT
KIT ROOM TURNOVER OR (KITS) ×3 IMPLANT
MANIFOLD NEPTUNE II (INSTRUMENTS) ×3 IMPLANT
NS IRRIG 1000ML POUR BTL (IV SOLUTION) ×3 IMPLANT
PACK ORTHO EXTREMITY (CUSTOM PROCEDURE TRAY) ×3 IMPLANT
PAD ARMBOARD 7.5X6 YLW CONV (MISCELLANEOUS) ×3 IMPLANT
PAD CAST 4YDX4 CTTN HI CHSV (CAST SUPPLIES) ×2 IMPLANT
PADDING CAST COTTON 4X4 STRL (CAST SUPPLIES) ×1
PADDING WEBRIL 4 STERILE (GAUZE/BANDAGES/DRESSINGS) ×3 IMPLANT
PADDING WEBRIL 6 STERILE (GAUZE/BANDAGES/DRESSINGS) ×3 IMPLANT
SPONGE GAUZE 4X4 12PLY (GAUZE/BANDAGES/DRESSINGS) ×9 IMPLANT
SPONGE LAP 18X18 X RAY DECT (DISPOSABLE) ×3 IMPLANT
STAPLER VISISTAT 35W (STAPLE) ×3 IMPLANT
STOCKINETTE IMPERVIOUS LG (DRAPES) IMPLANT
SUCTION FRAZIER TIP 10 FR DISP (SUCTIONS) ×3 IMPLANT
SUT ETHILON 2 0 PSLX (SUTURE) ×6 IMPLANT
TOWEL OR 17X24 6PK STRL BLUE (TOWEL DISPOSABLE) ×3 IMPLANT
TOWEL OR 17X26 10 PK STRL BLUE (TOWEL DISPOSABLE) ×3 IMPLANT
TUBE CONNECTING 12X1/4 (SUCTIONS) ×3 IMPLANT
UNDERPAD 30X30 INCONTINENT (UNDERPADS AND DIAPERS) ×6 IMPLANT
WATER STERILE IRR 1000ML POUR (IV SOLUTION) ×3 IMPLANT

## 2010-11-24 NOTE — Progress Notes (Signed)
Pt fever 102.7 MD aware, tylenol PO Given, pt vomited med up, Tylenol PR given. no new orders

## 2010-11-24 NOTE — Interval H&P Note (Signed)
History and Physical Interval Note:   11/24/2010   11:15 AM   [name]  has presented today for surgery, with the diagnosis of right FORE FOOT GANGRENE and left plantar foot ulcer with dystrophic toenails.  The various methods of treatment have been discussed with the patient and family. After consideration of risks, benefits and other options for treatment, the patient has consented to  Procedure(s):right transmetatarsal amputation, percutaneous tendoachilles lengthening, left plantar ulcer debridement and trimming of toenails as a surgical intervention .  The patients' history has been reviewed, patient examined, no change in status, stable for surgery.  I have reviewed the patients' chart and labs.  Questions were answered to the patient's satisfaction.  She udnerstnads teh risks and benefits of the alternative treamtnet options and would like to proceed. She specifically understsands risks of bleeding, nerve damage, blood clots, need for revision amputation and death.    [name]  MD

## 2010-11-24 NOTE — Progress Notes (Signed)
Cross Cover note  Temperature noted to be 102.7 post-op. On Vanc/Zosyn. Will continue current antibiotics and check CXR, UA/UCx, BCx. Tylenol 500mg  PO x 1.

## 2010-11-24 NOTE — Progress Notes (Signed)
Subjective:  Patient seen and examined, denies any complaints. Nervous  about having surgery today.  Objective: Vital signs in last 24 hours: Temp:  [97.8 F (36.6 C)-98.7 F (37.1 C)] 97.8 F (36.6 C) (11/20 1347) Pulse Rate:  [89-103] 89  (11/20 1347) Resp:  [17-39] 20  (11/20 1347) BP: (122-164)/(55-86) 138/86 mmHg (11/20 1347) SpO2:  [94 %-100 %] 97 % (11/20 1347) Weight change:  Last BM Date: 11/21/10  Intake/Output from previous day:   Total I/O In: 800 [I.V.:800] Out: 10 [Blood:10]   Physical Exam: General: Alert, awake, oriented x3, in no acute distress.  Heart: Regular rate and rhythm, without murmurs, rubs, gallops.  Lungs: Clear to auscultation bilaterally.  Abdomen: Soft, nontender, nondistended, positive bowel sounds.  Extremities: Feet dressing in place, erythema, warmth noted up to mid shin.     Lab Results: Results for orders placed during the hospital encounter of 11/22/10 (from the past 24 hour(s))  GLUCOSE, CAPILLARY     Status: Abnormal   Collection Time   11/23/10  5:02 PM      Component Value Range   Glucose-Capillary 353 (*) 70 - 99 (mg/dL)  GLUCOSE, CAPILLARY     Status: Abnormal   Collection Time   11/23/10 10:11 PM      Component Value Range   Glucose-Capillary 171 (*) 70 - 99 (mg/dL)   Comment 1 Documented in Chart     Comment 2 Notify RN    CBC     Status: Abnormal   Collection Time   11/24/10  5:38 AM      Component Value Range   WBC 8.1  4.0 - 10.5 (K/uL)   RBC 3.55 (*) 3.87 - 5.11 (MIL/uL)   Hemoglobin 9.2 (*) 12.0 - 15.0 (g/dL)   HCT 16.1 (*) 09.6 - 46.0 (%)   MCV 81.1  78.0 - 100.0 (fL)   MCH 25.9 (*) 26.0 - 34.0 (pg)   MCHC 31.9  30.0 - 36.0 (g/dL)   RDW 04.5 (*) 40.9 - 15.5 (%)   Platelets 389  150 - 400 (K/uL)  BASIC METABOLIC PANEL     Status: Abnormal   Collection Time   11/24/10  5:38 AM      Component Value Range   Sodium 134 (*) 135 - 145 (mEq/L)   Potassium 3.5  3.5 - 5.1 (mEq/L)   Chloride 98  96 - 112  (mEq/L)   CO2 27  19 - 32 (mEq/L)   Glucose, Bld 193 (*) 70 - 99 (mg/dL)   BUN 7  6 - 23 (mg/dL)   Creatinine, Ser 8.11  0.50 - 1.10 (mg/dL)   Calcium 8.7  8.4 - 91.4 (mg/dL)   GFR calc non Af Amer 88 (*) >90 (mL/min)   GFR calc Af Amer >90  >90 (mL/min)  GLUCOSE, CAPILLARY     Status: Abnormal   Collection Time   11/24/10  6:53 AM      Component Value Range   Glucose-Capillary 241 (*) 70 - 99 (mg/dL)   Comment 1 Documented in Chart     Comment 2 Notify RN    GLUCOSE, CAPILLARY     Status: Abnormal   Collection Time   11/24/10 10:32 AM      Component Value Range   Glucose-Capillary 194 (*) 70 - 99 (mg/dL)  GLUCOSE, CAPILLARY     Status: Abnormal   Collection Time   11/24/10 12:38 PM      Component Value Range   Glucose-Capillary 223 (*) 70 -  99 (mg/dL)   Comment 1 Documented in Chart     Comment 2 Notify RN     Comment 3 Call MD NNP PA CNM      Studies/Results: Dg Skull 1-3 Views  11/23/2010  *RADIOLOGY REPORT*  Clinical Data: Cranial surgery in the 1980s.Pre MRI screening.  SKULL - 1-3 VIEW  Comparison: None.  Findings: Stable dual sutures are present in the skull with numerous vascular clips along the right cerebral hemisphere and inner table of the skull.  These are of unknown composition. Because of uncertainty of the properties of the clips, MRI is contraindicated.  IMPRESSION: Postoperative changes of the right skull with numerous surgical clips and metallic sutures.  Original Report Authenticated By: Andreas Newport, M.D.   Dg Foot Complete Right  11/22/2010  *RADIOLOGY REPORT*  Clinical Data: Diabetic foot ulcers.  RIGHT FOOT COMPLETE - 3+ VIEW 11/22/2010:  Comparison: None.  Findings: Marked soft tissue swelling and gas within the soft tissues of the dorsum of the foot overlying the second MTP joint. No radiographic evidence of underlying osteomyelitis.  Marked soft tissue swelling involving the great toe and second toe.  No evidence of acute, subacute, or healed  fractures.  Bone mineral density well preserved.  Moderate sized plantar calcaneal spur. Enthesopathy at the insertion of the Achilles tendon on the posterior calcaneus.  IMPRESSION: Marked soft tissue swelling and gas within soft tissue ulcers of the dorsum of the foot distally.  No acute osseous abnormality, specifically, no evidence of osteomyelitis.  Moderate sized plantar calcaneal spur.  Original Report Authenticated By: Arnell Sieving, M.D.    Medications:    . bd getting started take home kit  1 kit Other Once  . ceFAZolin (ANCEF) IV  2 g Intravenous 60 min Pre-Op  . chlorhexidine  60 mL Topical Once  . docusate sodium  100 mg Oral BID  . heparin  5,000 Units Subcutaneous Q8H  . insulin aspart  0-15 Units Subcutaneous TID WC  . insulin aspart  0-5 Units Subcutaneous QHS  . insulin glargine  15 Units Subcutaneous Daily  . living well with diabetes book   Does not apply Once  . piperacillin-tazobactam (ZOSYN)  IV  3.375 g Intravenous Q8H  . DISCONTD: heparin  5,000 Units Subcutaneous Q8H  . DISCONTD: vancomycin  1,000 mg Intravenous Q8H    acetaminophen, acetaminophen, droperidol, HYDROmorphone, ondansetron (ZOFRAN) IV, ondansetron, oxyCODONE, senna, DISCONTD: bacitracin     . DISCONTD: sodium chloride    . DISCONTD: sodium chloride 75 mL/hr at 11/23/10 2225  . DISCONTD: lactated ringers      Assessment/Plan: 45 years old woman with history of diabetes mellitus, noncompliant with medication presented with bilateral foot ulcers Principal Problem:  *Diabetic foot ulcers associated with type 2 diabetes mellitus Active Problems:  Hypertension  Diabetes mellitus History of cerebral aneurysm status post clipping. Plan Patient underwent right foot trans-metatarsal amputation and left foot plantar ulcer debridement. Continue antibiotics , wound care Pain control. Continue Lantus insulin and moderate insulin scale, adjust as needed.    LOS: 2 days    Stephone Gum 11/24/2010, 3:16 PM

## 2010-11-24 NOTE — Progress Notes (Signed)
Patient educated to stay in bed and to keep legs elevated. Pt repeatedly attempting to get up and stand, bed alarm on, re-enforced the need to keep legs elevated and order for bed rest. Patient continues to get up multiple times.

## 2010-11-24 NOTE — Consult Note (Signed)
Wound consult follow-up:  Dr [name] from ortho service now following for assessment and plan of care.  Plans for OR debridement of wound.   Will not plan to follow further unless re-consulted.  [address], RN, MSN, [employer]  [phone]

## 2010-11-24 NOTE — Brief Op Note (Signed)
11/22/2010 - 11/24/2010  12:24 PM  PATIENT:  [name]  45 y.o. female  PRE-OPERATIVE DIAGNOSIS: 1.  Right forefoot gangrene 2.  Left forefoot plantar ulcer 3.  Left forefoot dystrophic toenails  POST-OPERATIVE DIAGNOSIS:  1.  Right forefoot gangrene 2.  Tight heelcord right foot 3.  Left forefoot plantar ulcer 4.  Left forefoot dystrophic toenails  Procedure(s): 1.  Right percutaneous tendoachilles lengthening 2.  Right foot transmetatarsal amputation 3.  Left foot plantar ulcer debridement 4.  Left foot toenail trimming  SURGEON:  [name], MD  ASSISTANT: n/a  ANESTHESIA:   General, regional  EBL:  minimal  TOURNIQUET: approx 25 min at 250 mm HG with thigh tourniquet  COMPLICATIONS:  None apparent  DISPOSITION:  Extubated, awake and stable to recovery.  DICTATION ID:   [id number]

## 2010-11-24 NOTE — Transfer of Care (Signed)
Immediate Anesthesia Transfer of Care Note  Patient: [name]  Procedure(s) Performed:  AMPUTATION FOOT - Right  FOOT TRANS METATARSAL AMPUTATION; IRRIGATION AND DEBRIDEMENT EXTREMITY - Debriedment of left plantar ulcer and trimming of toenails  Patient Location: PACU  Anesthesia Type: General  Level of Consciousness: awake, alert  and oriented  Airway & Oxygen Therapy: Patient Spontanous Breathing and Patient connected to nasal cannula oxygen  Post-op Assessment: Report given to PACU RN, Post -op Vital signs reviewed and stable and Patient moving all extremities  Post vital signs: Reviewed and stable  Complications: No apparent anesthesia complications

## 2010-11-24 NOTE — Anesthesia Procedure Notes (Signed)
Anesthesia Regional Block:  Popliteal block  Pre-Anesthetic Checklist: ,, timeout performed, Correct Patient, Correct Site, Correct Laterality, Correct Procedure,, site marked, risks and benefits discussed, Surgical consent,  Pre-op evaluation,  At surgeon's request and post-op pain management  Laterality: Right  Prep: chloraprep       Needles:  Injection technique: Single-shot  Needle Type: Echogenic Stimulator Needle          Additional Needles:  Procedures: ultrasound guided and nerve stimulator Popliteal block  Nerve Stimulator or Paresthesia:  Response: foot eversion, 0.5 mA,   Additional Responses:   Narrative:  Start time: 11/24/2010 10:46 AM End time: 11/24/2010 10:58 AM Injection made incrementally with aspirations every 5 mL.  Performed by: Personally   Additional Notes: Functioning IV was confirmed and monitors applied.  A 21ga echogenic arrow stimulator was used. Sterile prep and drape,hand hygiene and sterile gloves were used.Ultrasound guidance: relevent anatomy identified, needle position confirmed, local anesthetic spread visualized around nerve(s)., vascular puncture avoided.  Image printed for medical record.  Negative aspiration and negative test dose prior to incremental administration of local anesthetic. The patient tolerated the procedure well.  Interscalene brachial plexus block

## 2010-11-24 NOTE — Anesthesia Postprocedure Evaluation (Signed)
Anesthesia Post Note  Patient: [name]  Procedure(s) Performed:  AMPUTATION FOOT - Right  FOOT TRANS METATARSAL AMPUTATION; IRRIGATION AND DEBRIDEMENT EXTREMITY - Debriedment of left plantar ulcer and trimming of toenails  Anesthesia type: General  Patient location: PACU  Post pain: Pain level controlled  Post assessment: Patient's Cardiovascular Status Stable  Last Vitals:  Filed Vitals:   11/24/10 1237  BP: 153/74  Pulse:   Temp: 37.1 C  Resp:     Post vital signs: Reviewed and stable  Level of consciousness: sedated  Complications: No apparent anesthesia complications

## 2010-11-24 NOTE — Anesthesia Preprocedure Evaluation (Signed)
Anesthesia Evaluation  Patient identified by MRN, date of birth, ID band Patient awake    Reviewed: Allergy & Precautions, H&P , NPO status , Patient's Chart, lab work & pertinent test results  History of Anesthesia Complications Negative for: history of anesthetic complications  Airway Mallampati: II TM Distance: >3 FB     Dental  (+) Teeth Intact   Pulmonary neg pulmonary ROS,  clear to auscultation        Cardiovascular hypertension, Regular Normal- Systolic murmurs    Neuro/Psych  Neuromuscular disease    GI/Hepatic negative GI ROS, Neg liver ROS,   Endo/Other  Diabetes mellitus-, Poorly Controlled, Insulin Dependent  Renal/GU negative Renal ROS     Musculoskeletal   Abdominal   Peds  Hematology   Anesthesia Other Findings   Reproductive/Obstetrics                           Anesthesia Physical Anesthesia Plan  ASA: III  Anesthesia Plan: General   Post-op Pain Management:    Induction: Intravenous  Airway Management Planned: LMA  Additional Equipment:   Intra-op Plan:   Post-operative Plan: Extubation in OR  Informed Consent: I have reviewed the patients History and Physical, chart, labs and discussed the procedure including the risks, benefits and alternatives for the proposed anesthesia with the patient or authorized representative who has indicated his/her understanding and acceptance.   Dental advisory given  Plan Discussed with: CRNA, Anesthesiologist and Surgeon  Anesthesia Plan Comments:         Anesthesia Quick Evaluation

## 2010-11-25 ENCOUNTER — Encounter (HOSPITAL_COMMUNITY): Payer: Self-pay | Admitting: Orthopedic Surgery

## 2010-11-25 LAB — CBC
HCT: 25.9 % — ABNORMAL LOW (ref 36.0–46.0)
Hemoglobin: 8.3 g/dL — ABNORMAL LOW (ref 12.0–15.0)
MCH: 26 pg (ref 26.0–34.0)
MCHC: 32 g/dL (ref 30.0–36.0)
MCV: 81.2 fL (ref 78.0–100.0)
Platelets: 365 10*3/uL (ref 150–400)
RBC: 3.19 MIL/uL — ABNORMAL LOW (ref 3.87–5.11)
RDW: 18.5 % — ABNORMAL HIGH (ref 11.5–15.5)
WBC: 9.2 10*3/uL (ref 4.0–10.5)

## 2010-11-25 LAB — BASIC METABOLIC PANEL
BUN: 6 mg/dL (ref 6–23)
CO2: 26 mEq/L (ref 19–32)
Calcium: 8.5 mg/dL (ref 8.4–10.5)
Chloride: 102 mEq/L (ref 96–112)
Creatinine, Ser: 1.05 mg/dL (ref 0.50–1.10)
GFR calc Af Amer: 74 mL/min — ABNORMAL LOW (ref >90)
GFR calc non Af Amer: 64 mL/min — ABNORMAL LOW (ref >90)
Glucose, Bld: 228 mg/dL — ABNORMAL HIGH (ref 70–99)
Potassium: 3.8 mEq/L (ref 3.5–5.1)
Sodium: 137 mEq/L (ref 135–145)

## 2010-11-25 LAB — URINE CULTURE
Colony Count: NO GROWTH
Culture  Setup Time: 201211210105
Culture: NO GROWTH

## 2010-11-25 LAB — GLUCOSE, CAPILLARY
Glucose-Capillary: 216 mg/dL — ABNORMAL HIGH (ref 70–99)
Glucose-Capillary: 177 mg/dL — ABNORMAL HIGH (ref 70–99)
Glucose-Capillary: 164 mg/dL — ABNORMAL HIGH (ref 70–99)

## 2010-11-25 MED ORDER — FLEET ENEMA 7-19 GM/118ML RE ENEM: 1.0000 | ENEMA | Freq: Every day | RECTAL | Status: DC | PRN

## 2010-11-25 MED ORDER — SENNA 8.6 MG PO TABS
2.0000 | ORAL_TABLET | Freq: Two times a day (BID) | ORAL | Status: DC
  Administered 2010-11-25 (×2): 17.2 mg via ORAL
  Filled 2010-11-25 (×5): qty 2

## 2010-11-25 MED ORDER — POLYETHYLENE GLYCOL 3350 17 G PO PACK
17.0000 g | PACK | Freq: Two times a day (BID) | ORAL | Status: DC
  Administered 2010-11-25: 17 g via ORAL
  Filled 2010-11-25 (×4): qty 1

## 2010-11-25 MED ORDER — VANCOMYCIN HCL IN DEXTROSE 1-5 GM/200ML-% IV SOLN
1000.0000 mg | Freq: Two times a day (BID) | INTRAVENOUS | Status: DC
  Administered 2010-11-25 – 2010-11-28 (×5): 1000 mg via INTRAVENOUS
  Filled 2010-11-25 (×7): qty 200

## 2010-11-25 NOTE — Progress Notes (Signed)
Physical Therapy Evaluation Patient Details Name: [name] MRN: [id number] DOB: [birth year] Today's Date: 11/25/2010  Problem List:  Patient Active Problem List  Diagnoses  . Diabetic foot ulcer associated with type 2 diabetes mellitus  . Hypertension  . Arthritis  . Neuropathy  . Intracerebral hemorrhage  . Diabetes mellitus    Past Medical History:  Past Medical History  Diagnosis Date  . Diabetes mellitus   . Hypertension   . Arthritis   . Neuropathy   . Diabetic foot ulcer associated with type 2 diabetes mellitus   . Intracerebral hemorrhage As a teenager     States she had burst blood vessel as teenager, now with resultant minor visual field and hearing deficits   Past Surgical History:  Past Surgical History  Procedure Date  . Cesarean section   . Amputation 11/24/2010    Procedure: AMPUTATION FOOT;  Surgeon: [name], MD;  Location: [facility] OR;  Service: Orthopedics;  Laterality: Right;  Right  FOOT TRANS METATARSAL AMPUTATION  . I&d extremity 11/24/2010    Procedure: IRRIGATION AND DEBRIDEMENT EXTREMITY;  Surgeon: [name], MD;  Location: [facility] OR;  Service: Orthopedics;  Laterality: Left;  Debriedment of left plantar ulcer and trimming of toenails    PT Assessment/Plan/Recommendation PT Assessment Clinical Impression Statement: Pt. with decreased mobility secondary to right transmetarsal amputation.  Pt. stated she was in no pain at rest with pain in LLE increasing to 6 with ambulation.  Pt. is aware of her weightbearing restrictions although at times was unable to maintain NWB on RLE.  Discussed with patient the recommendation to have 24 hour supervision in order to return home safely.  Patient should progress quickly with ambulation.  PT Recommendation/Assessment: Patient will need skilled PT in the acute care venue PT Problem List: Decreased strength;Decreased activity tolerance;Decreased mobility;Decreased knowledge of use of DME;Decreased safety  awareness;Decreased knowledge of precautions;Pain Barriers to Discharge: Decreased caregiver support PT Therapy Diagnosis : Difficulty walking;Acute pain PT Plan PT Frequency: Min 5X/week PT Treatment/Interventions: DME instruction;Gait training;Stair training;Functional mobility training;Patient/family education PT Recommendation Recommendations for Other Services: OT consult Follow Up Recommendations: 24 hour supervision/assistance;Inpatient Rehab;Home health PT (Recommend CIR if 24 hour supervision not available ) Equipment Recommended: Rolling walker with 5" wheels PT Goals  Acute Rehab PT Goals PT Goal Formulation: With patient Time For Goal Achievement: 7 days Pt will go Supine/Side to Sit: Independently PT Goal: Supine/Side to Sit - Progress: Other (comment) Pt will go Sit to Supine/Side: Independently PT Goal: Sit to Supine/Side - Progress: Other (comment) Pt will Transfer Sit to Stand/Stand to Sit: with modified independence PT Transfer Goal: Sit to Stand/Stand to Sit - Progress: Other (comment) Pt will Ambulate: 51 - 150 feet;with modified independence;with rolling walker PT Goal: Ambulate - Progress: Other (comment) Pt will Go Up / Down Stairs: with rolling walker;1-2 stairs;with min assist PT Goal: Up/Down Stairs - Progress: Other (comment)  PT Evaluation Precautions/Restrictions  Precautions Precautions: Fall Restrictions Weight Bearing Restrictions: Yes RLE Weight Bearing: Non weight bearing Other Position/Activity Restrictions: Pt. states that she has tripped over her feet and fallen several times in the past year Prior Functioning  Home Living Lives With: Son;Daughter (77 yo and 23 yo) Receives Help From: Family (Parents have come from [place] to stay with her and kids) Type of Home: House Home Layout: Two level;1/2 bath on main level;Other (Comment) (staying on sofa on 1st level; doing bath at sink) Alternate Level Stairs-Rails: Right;Left (Rail on both sides until  halfway up) Alternate Level  Stairs-Number of Steps: one flight (14) Home Access: Stairs to enter Entrance Stairs-Rails: None Entrance Stairs-Number of Steps: 1 Bathroom Shower/Tub: Nurse, mental health Accessibility: Yes (Not sure) How Accessible: Accessible via walker Home Adaptive Equipment: None Prior Function Level of Independence: Independent with basic ADLs;Independent with homemaking with ambulation;Independent with transfers Driving: Yes Vocation: Other (comment) (Housewife) Comments: husband passed away 1 month ago Cognition Cognition Arousal/Alertness: Awake/alert Overall Cognitive Status: Appears within functional limits for tasks assessed Orientation Level: Oriented X4 Sensation/Coordination Sensation Additional Comments: Complaints of intermittent tingling and shooting pain in bilateral lower extremities.  Extremity Assessment RUE Assessment RUE Assessment: Within Functional Limits LUE Assessment LUE Assessment: Within Functional Limits RLE Assessment RLE Assessment: Exceptions to [facility] RLE AROM (degrees) RLE Overall AROM Comments: Pt. is status post right transmetatarsal amputation.  LLE Assessment LLE Assessment: Within Functional Limits Mobility (including Balance) Bed Mobility Bed Mobility: Yes Supine to Sit: 7: Independent;HOB flat Sitting - Scoot to Edge of Bed: 5: Supervision Sitting - Scoot to Edge of Bed Details (indicate cue type and reason): Cues for initiation and to keep RLE elevated.  Transfers Transfers: Yes Sit to Stand: 4: Min assist;From bed;From chair/3-in-1 Sit to Stand Details (indicate cue type and reason): Cues for hand placement and initiation, assist for anterior translation due to NWB on RLE.  2 trials. Stand to Sit: 4: Min assist;To chair/3-in-1;With armrests Stand to Sit Details: Cues for hand placement and initiation, assist for control of descent. 2 trials.  Ambulation/Gait Ambulation/Gait:  Yes Ambulation/Gait Assistance: 4: Min assist Ambulation/Gait Assistance Details (indicate cue type and reason): Cues for sequence and to keep RLE elevated, min guard.  Ambulation Distance (Feet): 40 Feet (2 trials, 25 on first, 15 on second with seated rest ) Assistive device: Rolling walker Gait Pattern: Decreased step length - left (Difficulty maintaining NWB on RLE, TDWB at times)    Exercise    End of Session PT - End of Session Equipment Utilized During Treatment: Gait belt Activity Tolerance: Patient tolerated treatment well Patient left: in chair;with call bell in reach Nurse Communication: Mobility status for transfers;Mobility status for ambulation;Weight bearing status General Behavior During Session: [facility] for tasks performed Cognition: [facility] for tasks performed  Laney Pastor, SPT  11/25/2010, 12:25 PM

## 2010-11-25 NOTE — Progress Notes (Signed)
Agree with above assessment.  11/25/2010 Veda Canning, PT Pager: [phone]

## 2010-11-25 NOTE — Progress Notes (Signed)
Pt's temp was 102.7 orally around 2200. Pt receiving antibiotics and given Tylenol 650 Suppository during prior shift. Pt did not complain of chills or feeling warm. Pt appeared asymptomatic. Dr. Dickey Gave called regarding temperature. Blood cultures, urine culture, and chest x-ray ordered. Pt given additional Tylenol 500 mg tablet. Pt did not complain of any nausea or vomiting and tolerated medication with small sip of water. Pt's current temp decreased to 100.4 orally. Will continue to monitor temperatures.  Salvadore Oxford, RN

## 2010-11-25 NOTE — Progress Notes (Signed)
ANTIBIOTIC CONSULT NOTE - FOLLOW UP  Pharmacy Consult for Vancomycin/Zosyn Indication: foot ulcer/fever  No Known Allergies  Patient Measurements: Height: 5\' 7"  (170.2 cm) Weight: 200 lb (90.719 kg) IBW/kg (Calculated) : 61.6  Adjusted Body Weight:   Vital Signs: Temp: 100.7 F (38.2 C) (11/21 1610) Temp src: Oral (11/21 0611) BP: 145/84 mmHg (11/21 0611) Pulse Rate: 103  (11/21 0611) Intake/Output from previous day: 11/20 0701 - 11/21 0700 In: 800 [I.V.:800] Out: 510 [Urine:250; Emesis/NG output:250; Blood:10] Intake/Output from this shift:    Labs:  Basename 11/25/10 0006 11/24/10 0538 11/23/10 0051  WBC 9.2 8.1 8.1  HGB 8.3* 9.2* 8.6*  PLT 365 389 320  LABCREA -- -- --  CREATININE 1.05 0.80 0.67   Estimated Creatinine Clearance: 79 ml/min (by C-G formula based on Cr of 1.05). No results found for this basename: VANCOTROUGH:2,VANCOPEAK:2,VANCORANDOM:2,GENTTROUGH:2,GENTPEAK:2,GENTRANDOM:2,TOBRATROUGH:2,TOBRAPEAK:2,TOBRARND:2,AMIKACINPEAK:2,AMIKACINTROU:2,AMIKACIN:2, in the last 72 hours   Microbiology: Recent Results (from the past 720 hour(s))  CULTURE, BLOOD (ROUTINE X 2)     Status: Normal (Preliminary result)   Collection Time   11/22/10  7:50 PM      Component Value Range Status Comment   Specimen Description BLOOD LEFT ARM   Final    Special Requests BOTTLES DRAWN AEROBIC AND ANAEROBIC 5CC   Final    Setup Time [id number]   Final    Culture     Final    Value:        BLOOD CULTURE RECEIVED NO GROWTH TO DATE CULTURE WILL BE HELD FOR 5 DAYS BEFORE ISSUING A FINAL NEGATIVE REPORT   Report Status PENDING   Incomplete   CULTURE, BLOOD (ROUTINE X 2)     Status: Normal (Preliminary result)   Collection Time   11/22/10  7:59 PM      Component Value Range Status Comment   Specimen Description BLOOD RIGHT ARM   Final    Special Requests BOTTLES DRAWN AEROBIC AND ANAEROBIC 10CC   Final    Setup Time [id number]   Final    Culture     Final    Value:         BLOOD CULTURE RECEIVED NO GROWTH TO DATE CULTURE WILL BE HELD FOR 5 DAYS BEFORE ISSUING A FINAL NEGATIVE REPORT   Report Status PENDING   Incomplete   TISSUE CULTURE     Status: Normal (Preliminary result)   Collection Time   11/24/10 12:15 PM      Component Value Range Status Comment   Specimen Description TISSUE FOOT RIGHT   Final    Special Requests     Final    Value: ON SWAB PT ON ZOSYN ANCEF VANCO TISSUE FROM IN BETWEEN TOES   Gram Stain PENDING   Incomplete    Culture Culture reincubated for better growth   Final    Report Status PENDING   Incomplete   ANAEROBIC CULTURE     Status: Normal (Preliminary result)   Collection Time   11/24/10 12:15 PM      Component Value Range Status Comment   Specimen Description TISSUE FOOT RIGHT   Final    Special Requests     Final    Value: ON SWAB PT ON ZOSYN ANCEF VANCO TISSUE FROM IN BETWEEN TOES   Gram Stain PENDING   Incomplete    Culture     Final    Value: NO ANAEROBES ISOLATED; CULTURE IN PROGRESS FOR 5 DAYS   Report Status PENDING   Incomplete   TISSUE CULTURE  Status: Normal (Preliminary result)   Collection Time   11/24/10 12:15 PM      Component Value Range Status Comment   Specimen Description TISSUE FOOT RIGHT   Final    Special Requests CUP DEEP WOUND PT ON ZOSYN ANCEF VANCO   Final    Gram Stain PENDING   Incomplete    Culture NO GROWTH 1 DAY   Final    Report Status PENDING   Incomplete   ANAEROBIC CULTURE     Status: Normal (Preliminary result)   Collection Time   11/24/10 12:15 PM      Component Value Range Status Comment   Specimen Description TISSUE FOOT RIGHT   Final    Special Requests CUP DEEP WOUND PT ON ZOSYN ANCEF VANCO   Final    Gram Stain     Final    Value: RARE WBC PRESENT, PREDOMINANTLY PMN     NO ORGANISMS SEEN   Culture     Final    Value: NO ANAEROBES ISOLATED; CULTURE IN PROGRESS FOR 5 DAYS   Report Status PENDING   Incomplete     Anti-infectives     Start     Dose/Rate Route Frequency  Ordered Stop   11/25/10 2200   vancomycin (VANCOCIN) IVPB 1000 mg/200 mL premix        1,000 mg 200 mL/hr over 60 Minutes Intravenous Every 12 hours 11/25/10 1424     11/24/10 1800   vancomycin (VANCOCIN) IVPB 1000 mg/200 mL premix  Status:  Discontinued        1,000 mg 200 mL/hr over 60 Minutes Intravenous Every 8 hours 11/24/10 1650 11/25/10 1424   11/24/10 0800   ceFAZolin (ANCEF) IVPB 2 g/50 mL premix  Status:  Discontinued        2 g 100 mL/hr over 30 Minutes Intravenous 60 min pre-op 11/23/10 2040 11/24/10 1554   11/23/10 0600   vancomycin (VANCOCIN) IVPB 1000 mg/200 mL premix  Status:  Discontinued        1,000 mg 200 mL/hr over 60 Minutes Intravenous Every 8 hours 11/23/10 0056 11/24/10 1448   11/23/10 0600  piperacillin-tazobactam (ZOSYN) IVPB 3.375 g       3.375 g 12.5 mL/hr over 240 Minutes Intravenous 3 times per day 11/23/10 0056     11/22/10 1945   vancomycin (VANCOCIN) IVPB 1000 mg/200 mL premix        1,000 mg 200 mL/hr over 60 Minutes Intravenous  Once 11/22/10 1940 11/22/10 2328   11/22/10 1945  piperacillin-tazobactam (ZOSYN) IVPB 3.375 g       3.375 g 12.5 mL/hr over 240 Minutes Intravenous  Once 11/22/10 1940 11/23/10 0044          Assessment: 45 yof POD #1 s/p R foot trans met amp, fever post op vanc/zosyn restarted. Day#3 of antibiotic therapy. WBC within normal limits and cx data has been negative to date. Did see a bump in scr overnight. Will make empiric dose adjustments to vancomycin. Planning on a trough level toward the end of this week if therapy is to continue.  Goal of Therapy:  Vancomycin trough level 10-15 mcg/ml  Plan:  Vancomycin 1g iv q12 hours Continue zosyn 3.375 q 8 hours Follow cx results Follow for changes in renal fx.  Severiano Gilbert 11/25/2010,2:29 PM

## 2010-11-25 NOTE — Progress Notes (Signed)
PATIENT DETAILS Name: [name] Age: 45 y.o. Sex: female Date of Birth: Feb 07, 1965 Admit Date: 11/22/2010 PCP:No primary provider on file.   Subjective: Less nausea, wants to eat.  Objective: Vital signs in last 24 hours: Patient Vitals for the past 24 hrs:  BP Temp Temp src Pulse Resp SpO2  11/25/10 1400 104/69 mmHg 99 F (37.2 C) Oral 91  20  97 %  11/25/10 0611 145/84 mmHg 100.7 F (38.2 C) Oral 103  23  95 %  11/25/10 0029 - 100.4 F (38 C) Oral - - -  11/24/10 2227 142/70 mmHg 102.7 F (39.3 C) Oral 106  20  86 %  11/24/10 1900 - 100.2 F (37.9 C) Oral - - -  11/24/10 1811 163/81 mmHg 102.7 F (39.3 C) Oral 128  20  92 %   Weight change:  Body mass index is 31.32 kg/(m^2).  Intake/Output from previous day:  Intake/Output Summary (Last 24 hours) at 11/25/10 1727 Last data filed at 11/25/10 1500  Gross per 24 hour  Intake    960 ml  Output    500 ml  Net    460 ml     PHYSICAL EXAM: Gen Exam: Awake and alert with clear speech.   Neck: Supple, No JVD.   Chest: B/L Clear.   CVS: S1 S2 Regular, no murmurs.  Abdomen: soft, BS +, non tender, non distended.  Extremities: no edema, lower extremities warm to touch. Status post right transmetatarsal and the patient, dressing seems to be dry. Neurologic: Non Focal.   Skin: No Rash.   Wounds: N/A.    CONSULTS:  orthopedic surgery  LAB RESULTS:  Basename 11/25/10 0006 11/24/10 0538  WBC 9.2 8.1  HGB 8.3* 9.2*  HCT 25.9* 28.8*  PLT 365 389   CMET CMP     Component Value Date/Time   NA 137 11/25/2010 0006   K 3.8 11/25/2010 0006   CL 102 11/25/2010 0006   CO2 26 11/25/2010 0006   GLUCOSE 228* 11/25/2010 0006   BUN 6 11/25/2010 0006   CREATININE 1.05 11/25/2010 0006   CALCIUM 8.5 11/25/2010 0006   GFRNONAA 64* 11/25/2010 0006   GFRAA 74* 11/25/2010 0006    GFR Estimated Creatinine Clearance: 79 ml/min (by C-G formula based on Cr of 1.05). No results found for this basename:  LIPASE:2,AMYLASE:2 in the last 72 hours No results found for this basename: CKTOTAL:3,CKMB:3,CKMBINDEX:3,TROPONINI:3 in the last 72 hours No results found for this basename: POCBNP:3 in the last 72 hours No results found for this basename: DDIMER:2 in the last 72 hours  Basename 11/23/10 0051 11/22/10 1957  HGBA1C 12.4* 11.8*   No results found for this basename: CHOL:2,HDL:2,LDLCALC:2,TRIG:2,CHOLHDL:2,LDLDIRECT:2 in the last 72 hours No results found for this basename: TSH,T4TOTAL,FREET3,T3FREE,THYROIDAB in the last 72 hours No results found for this basename: VITAMINB12:2,FOLATE:2,FERRITIN:2,TIBC:2,IRON:2,RETICCTPCT:2 in the last 72 hours COAGS No results found for this basename: PT:2,INR:2 in the last 72 hours MICROBIOLOGY: Recent Results (from the past 240 hour(s))  CULTURE, BLOOD (ROUTINE X 2)     Status: Normal (Preliminary result)   Collection Time   11/22/10  7:50 PM      Component Value Range Status Comment   Specimen Description BLOOD LEFT ARM   Final    Special Requests BOTTLES DRAWN AEROBIC AND ANAEROBIC 5CC   Final    Setup Time [id number]   Final    Culture     Final    Value:        BLOOD  CULTURE RECEIVED NO GROWTH TO DATE CULTURE WILL BE HELD FOR 5 DAYS BEFORE ISSUING A FINAL NEGATIVE REPORT   Report Status PENDING   Incomplete   CULTURE, BLOOD (ROUTINE X 2)     Status: Normal (Preliminary result)   Collection Time   11/22/10  7:59 PM      Component Value Range Status Comment   Specimen Description BLOOD RIGHT ARM   Final    Special Requests BOTTLES DRAWN AEROBIC AND ANAEROBIC 10CC   Final    Setup Time [id number]   Final    Culture     Final    Value:        BLOOD CULTURE RECEIVED NO GROWTH TO DATE CULTURE WILL BE HELD FOR 5 DAYS BEFORE ISSUING A FINAL NEGATIVE REPORT   Report Status PENDING   Incomplete   TISSUE CULTURE     Status: Normal (Preliminary result)   Collection Time   11/24/10 12:15 PM      Component Value Range Status Comment   Specimen  Description TISSUE FOOT RIGHT   Final    Special Requests     Final    Value: ON SWAB PT ON ZOSYN ANCEF VANCO TISSUE FROM IN BETWEEN TOES   Gram Stain PENDING   Incomplete    Culture Culture reincubated for better growth   Final    Report Status PENDING   Incomplete   ANAEROBIC CULTURE     Status: Normal (Preliminary result)   Collection Time   11/24/10 12:15 PM      Component Value Range Status Comment   Specimen Description TISSUE FOOT RIGHT   Final    Special Requests     Final    Value: ON SWAB PT ON ZOSYN ANCEF VANCO TISSUE FROM IN BETWEEN TOES   Gram Stain PENDING   Incomplete    Culture     Final    Value: NO ANAEROBES ISOLATED; CULTURE IN PROGRESS FOR 5 DAYS   Report Status PENDING   Incomplete   TISSUE CULTURE     Status: Normal (Preliminary result)   Collection Time   11/24/10 12:15 PM      Component Value Range Status Comment   Specimen Description TISSUE FOOT RIGHT   Final    Special Requests CUP DEEP WOUND PT ON ZOSYN ANCEF VANCO   Final    Gram Stain PENDING   Incomplete    Culture NO GROWTH 1 DAY   Final    Report Status PENDING   Incomplete   ANAEROBIC CULTURE     Status: Normal (Preliminary result)   Collection Time   11/24/10 12:15 PM      Component Value Range Status Comment   Specimen Description TISSUE FOOT RIGHT   Final    Special Requests CUP DEEP WOUND PT ON ZOSYN ANCEF VANCO   Final    Gram Stain     Final    Value: RARE WBC PRESENT, PREDOMINANTLY PMN     NO ORGANISMS SEEN   Culture     Final    Value: NO ANAEROBES ISOLATED; CULTURE IN PROGRESS FOR 5 DAYS   Report Status PENDING   Incomplete     RADIOLOGY STUDIES/RESULTS: Dg Skull 1-3 Views  11/23/2010  *RADIOLOGY REPORT*  Clinical Data: Cranial surgery in the 1980s.Pre MRI screening.  SKULL - 1-3 VIEW  Comparison: None.  Findings: Stable dual sutures are present in the skull with numerous vascular clips along the right cerebral hemisphere and inner table of the  skull.  These are of unknown  composition. Because of uncertainty of the properties of the clips, MRI is contraindicated.  IMPRESSION: Postoperative changes of the right skull with numerous surgical clips and metallic sutures.  Original Report Authenticated By: Andreas Newport, M.D.   Dg Chest Port 1 View  11/24/2010  *RADIOLOGY REPORT*  Clinical Data: Fever.  PORTABLE CHEST - 1 VIEW 11/24/2010 2324 hours:  Comparison: None.  Findings: Markedly suboptimal inspiration which accounts for crowded bronchovascular markings diffusely and accentuates cardiac silhouette.  Taking this into account, cardiac silhouette likely mildly enlarged and lungs clear.  No visible pleural effusions.  IMPRESSION: Markedly suboptimal inspiration.  Mild cardiomegaly.  No acute cardiopulmonary disease.  Original Report Authenticated By: Arnell Sieving, M.D.   Dg Foot Complete Right  11/22/2010  *RADIOLOGY REPORT*  Clinical Data: Diabetic foot ulcers.  RIGHT FOOT COMPLETE - 3+ VIEW 11/22/2010:  Comparison: None.  Findings: Marked soft tissue swelling and gas within the soft tissues of the dorsum of the foot overlying the second MTP joint. No radiographic evidence of underlying osteomyelitis.  Marked soft tissue swelling involving the great toe and second toe.  No evidence of acute, subacute, or healed fractures.  Bone mineral density well preserved.  Moderate sized plantar calcaneal spur. Enthesopathy at the insertion of the Achilles tendon on the posterior calcaneus.  IMPRESSION: Marked soft tissue swelling and gas within soft tissue ulcers of the dorsum of the foot distally.  No acute osseous abnormality, specifically, no evidence of osteomyelitis.  Moderate sized plantar calcaneal spur.  Original Report Authenticated By: Arnell Sieving, M.D.    MEDICATIONS: Scheduled Meds:   . acetaminophen  500 mg Oral Once  . docusate sodium  100 mg Oral BID  . heparin  5,000 Units Subcutaneous Q8H  . insulin aspart  0-15 Units Subcutaneous TID WC  . insulin  aspart  0-5 Units Subcutaneous QHS  . insulin glargine  15 Units Subcutaneous Daily  . oxymetazoline  2 spray Each Nare Once  . piperacillin-tazobactam (ZOSYN)  IV  3.375 g Intravenous Q8H  . polyethylene glycol  17 g Oral BID  . senna  2 tablet Oral BID  . vancomycin  1,000 mg Intravenous Q12H  . DISCONTD: vancomycin  1,000 mg Intravenous Q8H   Continuous Infusions:   . sodium chloride 20 mL/hr at 11/25/10 0746  . lactated ringers 500 mL (11/24/10 1711)   PRN Meds:.acetaminophen, acetaminophen, ondansetron (ZOFRAN) IV, ondansetron, oxyCODONE, senna, sodium phosphate  Antibiotics: Anti-infectives     Start     Dose/Rate Route Frequency Ordered Stop   11/25/10 2200   vancomycin (VANCOCIN) IVPB 1000 mg/200 mL premix        1,000 mg 200 mL/hr over 60 Minutes Intravenous Every 12 hours 11/25/10 1424     11/24/10 1800   vancomycin (VANCOCIN) IVPB 1000 mg/200 mL premix  Status:  Discontinued        1,000 mg 200 mL/hr over 60 Minutes Intravenous Every 8 hours 11/24/10 1650 11/25/10 1424   11/24/10 0800   ceFAZolin (ANCEF) IVPB 2 g/50 mL premix  Status:  Discontinued        2 g 100 mL/hr over 30 Minutes Intravenous 60 min pre-op 11/23/10 2040 11/24/10 1554   11/23/10 0600   vancomycin (VANCOCIN) IVPB 1000 mg/200 mL premix  Status:  Discontinued        1,000 mg 200 mL/hr over 60 Minutes Intravenous Every 8 hours 11/23/10 0056 11/24/10 1448   11/23/10 0600  piperacillin-tazobactam (ZOSYN) IVPB  3.375 g       3.375 g 12.5 mL/hr over 240 Minutes Intravenous 3 times per day 11/23/10 0056     11/22/10 1945   vancomycin (VANCOCIN) IVPB 1000 mg/200 mL premix        1,000 mg 200 mL/hr over 60 Minutes Intravenous  Once 11/22/10 1940 11/22/10 2328   11/22/10 1945  piperacillin-tazobactam (ZOSYN) IVPB 3.375 g       3.375 g 12.5 mL/hr over 240 Minutes Intravenous  Once 11/22/10 1940 11/23/10 0044            Assessment/Plan: Patient Active Hospital Problem List: Diabetic foot ulcer  associated with type 2 diabetes mellitus   Assessment: Status post right trans-metatarsal and the patient, POD #1    Plan: Continue with vancomycin and Zosyn. Intraoperative tissue cultures are still pending.   Hypertension    Assessment: Controlled without need for antihypertensives.    Plan: Monitor for now.   Diabetes mellitus    Assessment: Uncontrolled, HbA1c 12.4.    Plan: Continue with Lantus and NovoLog. Will adjust depending on CBG readings.  Disposition: Remain inpatient  DVT Prophylaxis: Heparin  Code Status: Full code   Maretta Bees, MD. 11/25/2010, 5:27 PM

## 2010-11-25 NOTE — Progress Notes (Signed)
Inpatient Diabetes Program Recommendations  AACE/ADA: New Consensus Statement on Inpatient Glycemic Control (2009)  Target Ranges:  Prepandial:   less than 140 mg/dL      Peak postprandial:   less than 180 mg/dL (1-2 hours)      Critically ill patients:  140 - 180 mg/dL   Reason for Visit: Elevated CBG's.  Inpatient Diabetes Program Recommendations Insulin - Basal: CBG this am was 216 mg/dL. Titrate Lantus up to 23 units daily. Correction (SSI): Continue current correction Novolog. HgbA1C: A1c=12.4% indicating average glucoses in the 300's.  Note: Needs close follow up as outpatient.  MD please order outpatient diabetes education.

## 2010-11-25 NOTE — Progress Notes (Signed)
Subjective: POD 1 s/p R foot trans met amp.  No c/o pain.  Denies f/c/n/v.  Objective: Vital signs in last 24 hours: Temp:  [97.8 F (36.6 C)-102.7 F (39.3 C)] 100.7 F (38.2 C) (11/21 1610) Pulse Rate:  [89-128] 103  (11/21 0611) Resp:  [18-39] 23  (11/21 0611) BP: (122-164)/(55-86) 145/84 mmHg (11/21 0611) SpO2:  [86 %-99 %] 95 % (11/21 0611)  Intake/Output from previous day: 11/20 0701 - 11/21 0700 In: 800 [I.V.:800] Out: 510 [Urine:250; Emesis/NG output:250; Blood:10] I  Basename 11/25/10 0006 11/24/10 0538  WBC 9.2 8.1  RBC 3.19* 3.55*  HCT 25.9* 28.8*  PLT 365 389    Basename 11/25/10 0006 11/24/10 0538  NA 137 134*  K 3.8 3.5  CL 102 98  CO2 26 27  BUN 6 7  CREATININE 1.05 0.80  GLUCOSE 228* 193*  CALCIUM 8.5 8.7     Right foot dressed and dry.  left foot dressed and dry.  cxs from OR show no growth to date.  Assessment/Plan: Continue NWB on R LE.  Keep splint clean and dry.  F/u with me in clinic in 2 weeks.  Call [phone] for appt.  WBAT on LLE sparingly.  Dressings per wound care.  Ortho signing off.  Please call with questions.  [phone].   [name] 11/25/2010, 12:37 PM

## 2010-11-25 NOTE — Progress Notes (Addendum)
11/25/10 15:50 Cheryl Cape RN, [name] [phone] Patient has [facility] insurance, her PCP is Dr. Jillyn Hidden at [facility].  Physical therapy rec's hhpt/ot, will add hhrn, patient chose [facility] from agency list.  Referral made to [facility] via [facility].  Lupita Leash notified as well.  Soc will begin 24-48 hrs post discharge. Patient may be ready possibly by Friday for dc.  HOME HEALTH AGENCIES SERVING [place]   Agencies that are Medicare-Certified and are affiliated with [facility]  Telephone Number Address  [facility]   [facility] has ownership interest in this company; however, you are under no obligation to use this agency. [phone] or  [phone] [address] [place], [place] [address]   Agencies that are Medicare-Certified and are not affiliated with [facility] Telephone Number Address  [facility] [phone] Fax [phone] [address] [place], [place]  [address]  [facility] [phone] or [phone] Fax [phone] [address] [place], [place] [address]  [facility] [phone] Fax [phone] [address] [place], [place] [address]  [facility] [phone] Fax [phone] [address] [place], [place]  [address]  [facility] [phone] Fax [phone] [address] Suite [place], [place] [address]  [facility] [phone] [address] [place], [place] [address]  [facility] [phone]  [address] [place], [place] [address]  [facility] [phone] or [phone] Fax [phone] [address] [place], [place]  [address]  [facility] [phone] Fax  [phone] [address] [place], [place]  [address]  [facility]  [phone] Fax [phone] [address] [place], [place] [address]               Agencies that are not Medicare-Certified and are not affiliated with [facility] Telephone Number Address  [facility], [place] [phone] or [phone] Fax [phone] [address] [place]  [address]  [facility] [phone] Fax [phone] [address] [place], [place]  [address]  [facility]  [phone] Fax [phone] [address] [place], [place] [address]  [facility] [phone] Fax [phone] [address] [place], [place] [address]  [facility] [phone] or [phone] Fax [phone] [address] [place], [place]  [address]  [facility] [phone] or [phone] Fax [phone] [address], [address] [place], [place]  [address]  [facility] [phone] Fax [phone] [address] [place], [place]  [address]  [facility] [phone] [address] [place], [place]  [address]  [facility] [phone] Fax [phone] [address] [place], [place]  [address]  [facility] [phone] Fax [phone]  [address] [place], [place]  [address]  [facility] [phone] Fax [phone] [address] [place], [place] [address]  [facility] [phone] Fax [phone] [address] [place], [place]  [address]

## 2010-11-25 NOTE — Progress Notes (Signed)
Pt admitted initially for celluitis BLE and diabetic foot ulcers of BLE. Pt, per rept, is S/P R partial foot amputation due to gangrene and L foot wound debridements 11/20.  Pt has an ace cast splint that is dry and intact to RLE. Pt has a dry dressing to LLE that is to be changed daily per MD order. Pt is to work with PT today. Pt repts LBM 11/17. Will rept to MD on rounds and request stool softners/laxative and administer per order. Pt, per rept, had multiple episodes of nausea and vomiting yesterday that is "gone" today. Pt repts passing gas and will request that MD advance diet on AM rounds. Lungs CTA. Pt repts nonprod cough. No s/sx resp distress and no c/o such. L pedal pulse noted to be weak to palpation. No pressure skin issues noted. Pt skin noted to be dry and flakey. Pt voids without difficulty. Pt instructed IS. Pt verb understanding and agrees to comply. IV infiltrated to L AC and R hand dislodged. IV team paged to restart. Pt is to be NWB RLE.

## 2010-11-25 NOTE — Op Note (Signed)
NAME:  XEE, HOLLMAN                  ACCOUNT NO.:  MEDICAL RECORD NO.:  [id number]  LOCATION:                                 FACILITY:  PHYSICIAN:  [name], MD        DATE OF BIRTH:  [birth year]  DATE OF PROCEDURE:  11/24/2010 DATE OF DISCHARGE:                              OPERATIVE REPORT   PREOPERATIVE DIAGNOSES: 1. Right forefoot gangrene. 2. Left forefoot plantar ulcer. 3. Left forefoot dystrophic toenails.  POSTOPERATIVE DIAGNOSES: 1. Right forefoot gangrene. 2. Tight heel cord, right foot. 3. Left forefoot plantar ulcer. 4. Left forefoot dystrophic toenails.  PROCEDURES: 1. Right percutaneous tendo Achilles lengthening. 2. Right foot transmetatarsal amputation. 3. Left forefoot plantar ulcer debridement including skin and     subcutaneous tissue. 4. Left foot toenail trimming.  SURGEON:  [name], MD  ANESTHESIA:  General, regional.  IV FLUIDS:  See anesthesia record.  ESTIMATED BLOOD LOSS:  Minimal.  TOURNIQUET TIME:  Approximately 25 minutes at 250 mmHg.  COMPLICATIONS:  None apparent.  DISPOSITION:  Extubated awake and stable to recovery.  INDICATIONS FOR PROCEDURE:  The patient is a 45 year old woman with uncontrolled diabetes who presented to the hospital with right foot cellulitis and frank gangrene of the forefoot.  Her forefoot has nearly complete destruction of the soft tissue around the MTP joints of the hallux 2nd and 3rd metatarsals.  This destruction of the forefoot soft tissues carried through to the plantar surface with multiple areas of full-thickness skin loss, down to the level of the bone.  She was also noted to have a left forefoot ulcer that is superficial.  She also has severely dystrophic toenails of the left foot.  She presents now for operative treatment of these conditions.  She understands the risks and benefits of the surgical procedures as well as the alternative treatment options.  She specifically understands  risks of bleeding, infection, nerve damage, blood clots, need for additional surgery, revision amputation, and death.  PROCEDURE:  After preoperative consent was obtained, the correct operative site was identified.  The patient was brought to the operating room and placed supine on the operating table.  General anesthesia was induced.  Preoperative antibiotics were administered.  Surgical time-out was taken.  The right lower extremity was prepped and draped in standard sterile fashion with a tourniquet around the thigh.  The extremity was elevated for several minutes and the tourniquet was inflated to 250 mmHg.  The foot was marked with a fishmouth incision at the level of the healthiest tissues distally.  The incision was made and sharp dissection was carried down to the level of the bone.  An elevator was used to elevate the soft tissues from the metatarsals dorsally and plantarly.  A sagittal saw was used to cut the metatarsals transversely and bevel them plantarly.  The forefoot was passed off the field as a specimen to pathology.  Deep tissue was obtained from the forefoot specimen and sent as a specimen to microbiology.  The forefoot was then irrigated copiously.  The named vessels were all cauterized, 2-0 nylon simple and retention sutures were placed approximating the wound edges appropriately.  Prior  to starting the amputation a triple hemisection percutaneous tendo Achilles lengthening was performed.  This allowed passive dorsiflexion of the ankle approximately 30 degrees with the knee extended.  At this point, the wounds were dressed with sterile dressings and a short-leg splint was applied.  The tourniquet was released at approximately 25 minutes.  Attention was then turned to the left lower extremity.  The callus surrounding the plantar ulcer was trimmed back to a healthy border of tissue.  The fibrinous exudate was removed with a sponge.  The dystrophic nails were all  trimmed with a sharp bone cutter.  A dry dressing was applied over the plantar ulcer.  The patient was then awakened from anesthesia and transported to the recovery room in stable condition.  FOLLOWUP PLAN:  The patient will be weightbearing as tolerated on her left lower extremity.  She will be nonweightbearing on the right lower extremity.  She will have physical therapy consults for assistance with ambulation.  She will follow up with me in 2 weeks in clinic.     [name], MD     JH/MEDQ  D:  11/24/2010  T:  11/24/2010  Job:  [id number]

## 2010-11-25 NOTE — Progress Notes (Signed)
Dressing of LLE removed and changed per MD order. L bottom of foot noted to have a circular open wound that is approx 1/2 x 1/2 cm. Surrounding tissue intact. Small amount of bldy drainage and the wound bed is pale in appearance. No s/sx infection noted. Pt has an old circled area of LLE that pt states is where she was red and swollen on admission. Reddness and swelling of LLE is greatly decreased now. L pedal pulse faint to palpation. New dressing of aquacel to wound with dry dressing applied per order. Pt tolerated well. Pt given all care noted related to diabetes as well as info regarding support group in area for amputees. Pt also given patient education guide and instructed how to watch movies and what movies to watch for diabetes education. Pt verb understanding and agrees to comply.

## 2010-11-26 DIAGNOSIS — D649 Anemia, unspecified: Secondary | ICD-10-CM | POA: Diagnosis present

## 2010-11-26 LAB — CBC
HCT: 24 % — ABNORMAL LOW (ref 36.0–46.0)
Hemoglobin: 7.7 g/dL — ABNORMAL LOW (ref 12.0–15.0)
MCH: 26.3 pg (ref 26.0–34.0)
MCHC: 32.1 g/dL (ref 30.0–36.0)
MCV: 81.9 fL (ref 78.0–100.0)
Platelets: 380 10*3/uL (ref 150–400)
RBC: 2.93 MIL/uL — ABNORMAL LOW (ref 3.87–5.11)
RDW: 18.6 % — ABNORMAL HIGH (ref 11.5–15.5)
WBC: 10.1 10*3/uL (ref 4.0–10.5)

## 2010-11-26 LAB — GLUCOSE, CAPILLARY
Glucose-Capillary: 174 mg/dL — ABNORMAL HIGH (ref 70–99)
Glucose-Capillary: 128 mg/dL — ABNORMAL HIGH (ref 70–99)
Glucose-Capillary: 161 mg/dL — ABNORMAL HIGH (ref 70–99)
Glucose-Capillary: 140 mg/dL — ABNORMAL HIGH (ref 70–99)

## 2010-11-26 LAB — OCCULT BLOOD X 1 CARD TO LAB, STOOL: Fecal Occult Bld: NEGATIVE

## 2010-11-26 LAB — BASIC METABOLIC PANEL
BUN: 6 mg/dL (ref 6–23)
CO2: 26 mEq/L (ref 19–32)
Calcium: 8.1 mg/dL — ABNORMAL LOW (ref 8.4–10.5)
Chloride: 102 mEq/L (ref 96–112)
Creatinine, Ser: 1.23 mg/dL — ABNORMAL HIGH (ref 0.50–1.10)
GFR calc Af Amer: 61 mL/min — ABNORMAL LOW (ref >90)
GFR calc non Af Amer: 53 mL/min — ABNORMAL LOW (ref >90)
Glucose, Bld: 143 mg/dL — ABNORMAL HIGH (ref 70–99)
Potassium: 3.6 mEq/L (ref 3.5–5.1)
Sodium: 137 mEq/L (ref 135–145)

## 2010-11-26 LAB — IRON AND TIBC
Iron: <10 ug/dL — ABNORMAL LOW (ref 42–135)
UIBC: 206 ug/dL (ref 125–400)

## 2010-11-26 LAB — VITAMIN B12: Vitamin B-12: 318 pg/mL (ref 211–911)

## 2010-11-26 LAB — FERRITIN: Ferritin: 120 ng/mL (ref 10–291)

## 2010-11-26 LAB — ABO/RH: ABO/RH(D): B POS

## 2010-11-26 LAB — RETICULOCYTES
RBC.: 3.18 MIL/uL — ABNORMAL LOW (ref 3.87–5.11)
Retic Count, Absolute: 57.2 10*3/uL (ref 19.0–186.0)
Retic Ct Pct: 1.8 % (ref 0.4–3.1)

## 2010-11-26 LAB — FOLATE: Folate: 3.6 ng/mL

## 2010-11-26 NOTE — Progress Notes (Addendum)
PATIENT DETAILS Name: [name] Age: 45 y.o. Sex: female Date of Birth: [birth year] Admit Date: 11/22/2010 PCP:No primary provider on file.   Subjective: Less nausea, wants to eat.  Objective: Vital signs in last 24 hours: Patient Vitals for the past 24 hrs:  BP Temp Temp src Pulse Resp SpO2  11/26/10 0551 109/79 mmHg 100.4 F (38 C) Oral 99  20  91 %  11/25/10 2200 133/81 mmHg 98.9 F (37.2 C) Oral 103  24  97 %  11/25/10 1400 104/69 mmHg 99 F (37.2 C) Oral 91  20  97 %   Weight change:  Body mass index is 31.32 kg/(m^2).  Intake/Output from previous day:  Intake/Output Summary (Last 24 hours) at 11/26/10 1354 Last data filed at 11/26/10 1209  Gross per 24 hour  Intake    960 ml  Output    750 ml  Net    210 ml     PHYSICAL EXAM: Gen Exam: Awake and alert with clear speech.   Neck: Supple, No JVD.   Chest: B/L Clear.   CVS: S1 S2 Regular, no murmurs.  Abdomen: soft, BS +, non tender, non distended.  Extremities: no edema, lower extremities warm to touch. Status post right transmetatarsal and the patient, dressing seems to be dry. Neurologic: Non Focal.   Skin: No Rash.   Wounds: N/A.    CONSULTS:  orthopedic surgery  LAB RESULTS:  Basename 11/26/10 1010 11/25/10 0006  WBC 10.1 9.2  HGB 7.7* 8.3*  HCT 24.0* 25.9*  PLT 380 365   CMET CMP     Component Value Date/Time   NA 137 11/26/2010 1010   K 3.6 11/26/2010 1010   CL 102 11/26/2010 1010   CO2 26 11/26/2010 1010   GLUCOSE 143* 11/26/2010 1010   BUN 6 11/26/2010 1010   CREATININE 1.23* 11/26/2010 1010   CALCIUM 8.1* 11/26/2010 1010   GFRNONAA 53* 11/26/2010 1010   GFRAA 61* 11/26/2010 1010    GFR Estimated Creatinine Clearance: 67.4 ml/min (by C-G formula based on Cr of 1.23). No results found for this basename: LIPASE:2,AMYLASE:2 in the last 72 hours No results found for this basename: CKTOTAL:3,CKMB:3,CKMBINDEX:3,TROPONINI:3 in the last 72 hours No results found for this  basename: POCBNP:3 in the last 72 hours No results found for this basename: DDIMER:2 in the last 72 hours No results found for this basename: HGBA1C:2 in the last 72 hours No results found for this basename: CHOL:2,HDL:2,LDLCALC:2,TRIG:2,CHOLHDL:2,LDLDIRECT:2 in the last 72 hours No results found for this basename: TSH,T4TOTAL,FREET3,T3FREE,THYROIDAB in the last 72 hours No results found for this basename: VITAMINB12:2,FOLATE:2,FERRITIN:2,TIBC:2,IRON:2,RETICCTPCT:2 in the last 72 hours COAGS No results found for this basename: PT:2,INR:2 in the last 72 hours MICROBIOLOGY: Recent Results (from the past 240 hour(s))  CULTURE, BLOOD (ROUTINE X 2)     Status: Normal (Preliminary result)   Collection Time   11/22/10  7:50 PM      Component Value Range Status Comment   Specimen Description BLOOD LEFT ARM   Final    Special Requests BOTTLES DRAWN AEROBIC AND ANAEROBIC 5CC   Final    Setup Time [id number]   Final    Culture     Final    Value:        BLOOD CULTURE RECEIVED NO GROWTH TO DATE CULTURE WILL BE HELD FOR 5 DAYS BEFORE ISSUING A FINAL NEGATIVE REPORT   Report Status PENDING   Incomplete   CULTURE, BLOOD (ROUTINE X 2)     Status: Normal (  Preliminary result)   Collection Time   11/22/10  7:59 PM      Component Value Range Status Comment   Specimen Description BLOOD RIGHT ARM   Final    Special Requests BOTTLES DRAWN AEROBIC AND ANAEROBIC 10CC   Final    Setup Time [id number]   Final    Culture     Final    Value:        BLOOD CULTURE RECEIVED NO GROWTH TO DATE CULTURE WILL BE HELD FOR 5 DAYS BEFORE ISSUING A FINAL NEGATIVE REPORT   Report Status PENDING   Incomplete   TISSUE CULTURE     Status: Normal (Preliminary result)   Collection Time   11/24/10 12:15 PM      Component Value Range Status Comment   Specimen Description TISSUE FOOT RIGHT   Final    Special Requests     Final    Value: ON SWAB PT ON ZOSYN ANCEF VANCO TISSUE FROM IN BETWEEN TOES   Gram Stain PENDING    Incomplete    Culture     Final    Value: MODERATE GROUP B STREP(S.AGALACTIAE)ISOLATED     Note: TESTING AGAINST S. AGALACTIAE NOT ROUTINELY PERFORMED DUE TO PREDICTABILITY OF AMP/PEN/VAN SUSCEPTIBILITY.   Report Status PENDING   Incomplete   ANAEROBIC CULTURE     Status: Normal (Preliminary result)   Collection Time   11/24/10 12:15 PM      Component Value Range Status Comment   Specimen Description TISSUE FOOT RIGHT   Final    Special Requests     Final    Value: ON SWAB PT ON ZOSYN ANCEF VANCO TISSUE FROM IN BETWEEN TOES   Gram Stain PENDING   Incomplete    Culture     Final    Value: NO ANAEROBES ISOLATED; CULTURE IN PROGRESS FOR 5 DAYS   Report Status PENDING   Incomplete   TISSUE CULTURE     Status: Normal (Preliminary result)   Collection Time   11/24/10 12:15 PM      Component Value Range Status Comment   Specimen Description TISSUE FOOT RIGHT   Final    Special Requests CUP DEEP WOUND PT ON ZOSYN ANCEF VANCO   Final    Gram Stain PENDING   Incomplete    Culture Culture reincubated for better growth   Final    Report Status PENDING   Incomplete   ANAEROBIC CULTURE     Status: Normal (Preliminary result)   Collection Time   11/24/10 12:15 PM      Component Value Range Status Comment   Specimen Description TISSUE FOOT RIGHT   Final    Special Requests CUP DEEP WOUND PT ON ZOSYN ANCEF VANCO   Final    Gram Stain     Final    Value: RARE WBC PRESENT, PREDOMINANTLY PMN     NO ORGANISMS SEEN   Culture     Final    Value: NO ANAEROBES ISOLATED; CULTURE IN PROGRESS FOR 5 DAYS   Report Status PENDING   Incomplete   URINE CULTURE     Status: Normal   Collection Time   11/24/10 10:47 PM      Component Value Range Status Comment   Specimen Description URINE, RANDOM   Final    Special Requests NONE   Final    Setup Time [id number]   Final    Colony Count NO GROWTH   Final    Culture NO GROWTH  Final    Report Status 11/25/2010 FINAL   Final   CULTURE, BLOOD (SINGLE)      Status: Normal (Preliminary result)   Collection Time   11/25/10 12:06 AM      Component Value Range Status Comment   Specimen Description BLOOD RIGHT ARM   Final    Special Requests BOTTLES DRAWN AEROBIC AND ANAEROBIC 10CC EACH   Final    Setup Time [id number]   Final    Culture     Final    Value:        BLOOD CULTURE RECEIVED NO GROWTH TO DATE CULTURE WILL BE HELD FOR 5 DAYS BEFORE ISSUING A FINAL NEGATIVE REPORT   Report Status PENDING   Incomplete     RADIOLOGY STUDIES/RESULTS: Dg Skull 1-3 Views  11/23/2010  *RADIOLOGY REPORT*  Clinical Data: Cranial surgery in the 1980s.Pre MRI screening.  SKULL - 1-3 VIEW  Comparison: None.  Findings: Stable dual sutures are present in the skull with numerous vascular clips along the right cerebral hemisphere and inner table of the skull.  These are of unknown composition. Because of uncertainty of the properties of the clips, MRI is contraindicated.  IMPRESSION: Postoperative changes of the right skull with numerous surgical clips and metallic sutures.  Original Report Authenticated By: Andreas Newport, M.D.   Dg Chest Port 1 View  11/24/2010  *RADIOLOGY REPORT*  Clinical Data: Fever.  PORTABLE CHEST - 1 VIEW 11/24/2010 2324 hours:  Comparison: None.  Findings: Markedly suboptimal inspiration which accounts for crowded bronchovascular markings diffusely and accentuates cardiac silhouette.  Taking this into account, cardiac silhouette likely mildly enlarged and lungs clear.  No visible pleural effusions.  IMPRESSION: Markedly suboptimal inspiration.  Mild cardiomegaly.  No acute cardiopulmonary disease.  Original Report Authenticated By: Arnell Sieving, M.D.   Dg Foot Complete Right  11/22/2010  *RADIOLOGY REPORT*  Clinical Data: Diabetic foot ulcers.  RIGHT FOOT COMPLETE - 3+ VIEW 11/22/2010:  Comparison: None.  Findings: Marked soft tissue swelling and gas within the soft tissues of the dorsum of the foot overlying the second MTP joint. No  radiographic evidence of underlying osteomyelitis.  Marked soft tissue swelling involving the great toe and second toe.  No evidence of acute, subacute, or healed fractures.  Bone mineral density well preserved.  Moderate sized plantar calcaneal spur. Enthesopathy at the insertion of the Achilles tendon on the posterior calcaneus.  IMPRESSION: Marked soft tissue swelling and gas within soft tissue ulcers of the dorsum of the foot distally.  No acute osseous abnormality, specifically, no evidence of osteomyelitis.  Moderate sized plantar calcaneal spur.  Original Report Authenticated By: Arnell Sieving, M.D.    MEDICATIONS: Scheduled Meds:    . docusate sodium  100 mg Oral BID  . heparin  5,000 Units Subcutaneous Q8H  . insulin aspart  0-15 Units Subcutaneous TID WC  . insulin aspart  0-5 Units Subcutaneous QHS  . insulin glargine  15 Units Subcutaneous Daily  . oxymetazoline  2 spray Each Nare Once  . piperacillin-tazobactam (ZOSYN)  IV  3.375 g Intravenous Q8H  . polyethylene glycol  17 g Oral BID  . senna  2 tablet Oral BID  . vancomycin  1,000 mg Intravenous Q12H  . DISCONTD: vancomycin  1,000 mg Intravenous Q8H   Continuous Infusions:    . sodium chloride 20 mL/hr at 11/25/10 0746  . lactated ringers 500 mL (11/24/10 1711)   PRN Meds:.acetaminophen, acetaminophen, ondansetron (ZOFRAN) IV, ondansetron, oxyCODONE, senna, sodium phosphate  Antibiotics: Anti-infectives     Start     Dose/Rate Route Frequency Ordered Stop   11/25/10 2200   vancomycin (VANCOCIN) IVPB 1000 mg/200 mL premix        1,000 mg 200 mL/hr over 60 Minutes Intravenous Every 12 hours 11/25/10 1424     11/24/10 1800   vancomycin (VANCOCIN) IVPB 1000 mg/200 mL premix  Status:  Discontinued        1,000 mg 200 mL/hr over 60 Minutes Intravenous Every 8 hours 11/24/10 1650 11/25/10 1424   11/24/10 0800   ceFAZolin (ANCEF) IVPB 2 g/50 mL premix  Status:  Discontinued        2 g 100 mL/hr over 30 Minutes  Intravenous 60 min pre-op 11/23/10 2040 11/24/10 1554   11/23/10 0600   vancomycin (VANCOCIN) IVPB 1000 mg/200 mL premix  Status:  Discontinued        1,000 mg 200 mL/hr over 60 Minutes Intravenous Every 8 hours 11/23/10 0056 11/24/10 1448   11/23/10 0600  piperacillin-tazobactam (ZOSYN) IVPB 3.375 g       3.375 g 12.5 mL/hr over 240 Minutes Intravenous 3 times per day 11/23/10 0056     11/22/10 1945   vancomycin (VANCOCIN) IVPB 1000 mg/200 mL premix        1,000 mg 200 mL/hr over 60 Minutes Intravenous  Once 11/22/10 1940 11/22/10 2328   11/22/10 1945  piperacillin-tazobactam (ZOSYN) IVPB 3.375 g       3.375 g 12.5 mL/hr over 240 Minutes Intravenous  Once 11/22/10 1940 11/23/10 0044          Assessment/Plan: Patient Active Hospital Problem List: Diabetic foot ulcer associated with type 2 diabetes mellitus   Assessment: Status post right trans-metatarsal and the patient, POD #2   Plan: Continue with vancomycin and Zosyn. Intraoperative tissue cultures are still pending. Had low-grade fever overnight-continue to monitor closely.  Hypertension    Assessment: Controlled without need for antihypertensives.    Plan: Monitor for now-still no need for any antihypertensive medications  Diabetes mellitus    Assessment: Uncontrolled, HbA1c 12.4.    Plan: Continue with Lantus and NovoLog. Will adjust depending on CBG readings. Diet being advanced, keep on current dosing of Lantus.  Anemia   Assessment: Believe most of this is chronic, patient apparently gives a history of menorrhagia. No overt evidence of blood loss.   Plan: Check anemia panel, repeat CBC in the morning. Check FOBT.Depending on anemia panel will order further testing if needed as an inpatient  Disposition: Remain inpatient  DVT Prophylaxis: Heparin  Code Status: Full code   Maretta Bees, MD. 11/26/2010, 1:54 PM

## 2010-11-27 ENCOUNTER — Inpatient Hospital Stay (HOSPITAL_COMMUNITY): Payer: 59

## 2010-11-27 DIAGNOSIS — L97509 Non-pressure chronic ulcer of other part of unspecified foot with unspecified severity: Secondary | ICD-10-CM

## 2010-11-27 DIAGNOSIS — S98919A Complete traumatic amputation of unspecified foot, level unspecified, initial encounter: Secondary | ICD-10-CM

## 2010-11-27 DIAGNOSIS — I96 Gangrene, not elsewhere classified: Secondary | ICD-10-CM

## 2010-11-27 DIAGNOSIS — E1149 Type 2 diabetes mellitus with other diabetic neurological complication: Secondary | ICD-10-CM

## 2010-11-27 DIAGNOSIS — I739 Peripheral vascular disease, unspecified: Secondary | ICD-10-CM

## 2010-11-27 LAB — CBC
HCT: 23.5 % — ABNORMAL LOW (ref 36.0–46.0)
Hemoglobin: 7.6 g/dL — ABNORMAL LOW (ref 12.0–15.0)
MCH: 26.3 pg (ref 26.0–34.0)
MCHC: 32.3 g/dL (ref 30.0–36.0)
MCV: 81.3 fL (ref 78.0–100.0)
Platelets: 385 10*3/uL (ref 150–400)
RBC: 2.89 MIL/uL — ABNORMAL LOW (ref 3.87–5.11)
RDW: 18.4 % — ABNORMAL HIGH (ref 11.5–15.5)
WBC: 8.6 10*3/uL (ref 4.0–10.5)

## 2010-11-27 LAB — GLUCOSE, CAPILLARY
Glucose-Capillary: 108 mg/dL — ABNORMAL HIGH (ref 70–99)
Glucose-Capillary: 105 mg/dL — ABNORMAL HIGH (ref 70–99)
Glucose-Capillary: 117 mg/dL — ABNORMAL HIGH (ref 70–99)
Glucose-Capillary: 92 mg/dL (ref 70–99)

## 2010-11-27 LAB — TISSUE CULTURE

## 2010-11-27 LAB — BASIC METABOLIC PANEL
BUN: 6 mg/dL (ref 6–23)
CO2: 26 mEq/L (ref 19–32)
Calcium: 8.3 mg/dL — ABNORMAL LOW (ref 8.4–10.5)
Chloride: 105 mEq/L (ref 96–112)
Creatinine, Ser: 1.41 mg/dL — ABNORMAL HIGH (ref 0.50–1.10)
GFR calc Af Amer: 52 mL/min — ABNORMAL LOW (ref >90)
GFR calc non Af Amer: 45 mL/min — ABNORMAL LOW (ref >90)
Glucose, Bld: 100 mg/dL — ABNORMAL HIGH (ref 70–99)
Potassium: 3.4 mEq/L — ABNORMAL LOW (ref 3.5–5.1)
Sodium: 139 mEq/L (ref 135–145)

## 2010-11-27 LAB — PREPARE RBC (CROSSMATCH)

## 2010-11-27 MED ORDER — FERROUS SULFATE 325 (65 FE) MG PO TABS
325.0000 mg | ORAL_TABLET | Freq: Three times a day (TID) | ORAL | Status: DC
  Administered 2010-11-27 – 2010-11-29 (×7): 325 mg via ORAL
  Filled 2010-11-27 (×9): qty 1

## 2010-11-27 MED ORDER — ACETAMINOPHEN 325 MG PO TABS
650.0000 mg | ORAL_TABLET | Freq: Once | ORAL | Status: AC
  Administered 2010-11-27: 650 mg via ORAL
  Filled 2010-11-27: qty 2

## 2010-11-27 MED ORDER — DIPHENHYDRAMINE HCL 50 MG/ML IJ SOLN
25.0000 mg | Freq: Once | INTRAMUSCULAR | Status: AC
  Administered 2010-11-27: 25 mg via INTRAVENOUS
  Filled 2010-11-27: qty 1

## 2010-11-27 MED ORDER — FUROSEMIDE 10 MG/ML IJ SOLN
20.0000 mg | Freq: Once | INTRAMUSCULAR | Status: AC
  Administered 2010-11-27: 20 mg via INTRAVENOUS
  Filled 2010-11-27: qty 2

## 2010-11-27 NOTE — Consult Note (Signed)
Physical Medicine and Rehabilitation Consult Reason for Consult:right foot transmetatarsal amputation Referring Phsyician: triad [name] is an 45 y.o. female.   HPI: 45 year-old female with uncontrolled diabetes mellitus admitted 11/22/2010 with increasing lesions to the right foot as well as bunion like lesions to the left foot. X-ray of right foot did not reveal any osteomyelitis but did show marked soft tissue swelling. Due to gangrenous changes of the right foot patient underwent right foot transmetatarsal amputation on 11/24/2010 per Dr. [name] as well as right percutaneous tendon Achilles lengthening and debridement of left forefoot ulcer. Patient is nonweightbearing to the right lower tremor the and weightbearing as tolerated on left lower extremity sparingly. Subcutaneous heparin ongoing for deep vein thrombosis prophylaxis. Findings of hemoglobin A1c of 12.4 with Lantus insulin directed. Currently patient requires minimal assistance to angulate 40 feet with an assistive device. Patient was seen in consult by rehabilitation services at the recommendations of physical therapy to consider inpatient rehabilitation services.  Review of Systems  Constitutional: Positive for malaise/fatigue. Negative for fever.  Eyes: Negative for blurred vision.  Respiratory: Negative for cough and shortness of breath.   Cardiovascular: Negative for chest pain.  Gastrointestinal: Positive for constipation. Negative for nausea.  Genitourinary: Negative for urgency.  Musculoskeletal: Positive for joint pain.  Skin: Negative for rash.  Neurological: Negative for dizziness, seizures and headaches.  Psychiatric/Behavioral: Negative.    Past Medical History  Diagnosis Date  . Diabetes mellitus   . Hypertension   . Arthritis   . Neuropathy   . Diabetic foot ulcer associated with type 2 diabetes mellitus   . Intracerebral hemorrhage As a teenager     States she had burst blood vessel as teenager, now  with resultant minor visual field and hearing deficits   Past Surgical History  Procedure Date  . Cesarean section   . Amputation 11/24/2010    Procedure: AMPUTATION FOOT;  Surgeon: [name], MD;  Location: [facility] OR;  Service: Orthopedics;  Laterality: Right;  Right  FOOT TRANS METATARSAL AMPUTATION  . I&d extremity 11/24/2010    Procedure: IRRIGATION AND DEBRIDEMENT EXTREMITY;  Surgeon: [name], MD;  Location: [facility] OR;  Service: Orthopedics;  Laterality: Left;  Debriedment of left plantar ulcer and trimming of toenails   Family History  Problem Relation Age of Onset  . Diabetes Mother   . Diabetes Father   . Heart disease Maternal Grandmother    Social History:  reports that she has never smoked. She does not have any smokeless tobacco history on file. She reports that she does not drink alcohol or use illicit drugs. Allergies: No Known Allergies Medications Prior to Admission  Medication Dose Route Frequency Provider Last Rate Last Dose  . 0.9 %  sodium chloride infusion   Intravenous Continuous Constance Goltz, MD 20 mL/hr at 11/25/10 [phone]    . acetaminophen (TYLENOL) tablet 650 mg  650 mg Oral Q6H PRN Carlota Raspberry, MD   650 mg at 11/25/10 1025   Or  . acetaminophen (TYLENOL) suppository 650 mg  650 mg Rectal Q6H PRN Carlota Raspberry, MD   650 mg at 11/24/10 1839  . acetaminophen (TYLENOL) tablet 500 mg  500 mg Oral Once [employer]   500 mg at 11/24/10 2258  . bd getting started take home kit 3/10 ml X 30g syringes 1 kit  1 kit Other Once [employer]   1 kit at 11/23/10 2200  . chlorhexidine (HIBICLENS) 4 % liquid 4 application  60 mL Topical  Once [name], MD   4 application at 11/23/10 2223  . docusate sodium (COLACE) capsule 100 mg  100 mg Oral BID Carlota Raspberry, MD   100 mg at 11/26/10 1042  . heparin injection 5,000 Units  5,000 Units Subcutaneous Q8H [name], MD   5,000 Units at 11/27/10 [phone]  . insulin aspart (novoLOG) 100 UNIT/ML injection           . insulin aspart  (novoLOG) injection 0-15 Units  0-15 Units Subcutaneous TID WC Sosan Abdullah   2 Units at 11/26/10 1800  . insulin aspart (novoLOG) injection 0-5 Units  0-5 Units Subcutaneous QHS Sosan Abdullah   2 Units at 11/24/10 2258  . insulin glargine (LANTUS) injection 15 Units  15 Units Subcutaneous Daily Sosan Abdullah   15 Units at 11/26/10 1100  . living well with diabetes book MISC   Does not apply Once [employer]      . ondansetron (ZOFRAN) tablet 4 mg  4 mg Oral Q6H PRN Carlota Raspberry, MD       Or  . ondansetron [facility]) injection 4 mg  4 mg Intravenous Q6H PRN Carlota Raspberry, MD   4 mg at 11/26/10 9604  . oxyCODONE (Oxy IR/ROXICODONE) immediate release tablet 5 mg  5 mg Oral Q4H PRN Carlota Raspberry, MD   5 mg at 11/27/10 5409  . oxymetazoline (AFRIN) 0.05 % nasal spray 2 spray  2 spray Each Nare Once Constance Goltz, MD      . piperacillin-tazobactam (ZOSYN) IVPB 3.375 g  3.375 g Intravenous Once Lyanne Co, MD   3.375 g at 11/22/10 2044  . piperacillin-tazobactam (ZOSYN) IVPB 3.375 g  3.375 g Intravenous Q8H Hilario Quarry Amend, PHARMD   3.375 g at 11/27/10 0834  . senna (SENOKOT) tablet 17.2 mg  2 tablet Oral Daily PRN Carlota Raspberry, MD      . sodium chloride 0.9 % bolus 1,000 mL  1,000 mL Intravenous Once Rise Patience, PA   1,000 mL at 11/22/10 2047  . sodium phosphate (FLEET) 7-19 GM/118ML enema 1 enema  1 enema Rectal Daily PRN Shanker Ghimire      . vancomycin (VANCOCIN) IVPB 1000 mg/200 mL premix  1,000 mg Intravenous Once Lyanne Co, MD   1,000 mg at 11/22/10 2228  . vancomycin (VANCOCIN) IVPB 1000 mg/200 mL premix  1,000 mg Intravenous Q12H Severiano Gilbert, PHARMD   1,000 mg at 11/26/10 2307  . DISCONTD: 0.9 %  sodium chloride infusion   Intravenous Continuous Carlota Raspberry, MD      . DISCONTD: 0.9 %  sodium chloride infusion   Intravenous Continuous [name], MD 75 mL/hr at 11/23/10 2225    . DISCONTD: atropine injection 0.4 mg  0.4 mg Intravenous Once PRN Constance Goltz,  MD      . DISCONTD: bacitracin ointment    PRN [name], MD   1 application at 11/24/10 1221  . DISCONTD: ceFAZolin (ANCEF) IVPB 2 g/50 mL premix  2 g Intravenous 60 min Pre-Op [name], MD      . DISCONTD: droperidol (INAPSINE) injection 0.625 mg  0.625 mg Intravenous PRN Remonia Richter, MD      . DISCONTD: fentaNYL (SUBLIMAZE) injection 50-100 mcg  50-100 mcg Intravenous PRN Constance Goltz, MD      . DISCONTD: glycopyrrolate (ROBINUL) injection 0.2 mg  0.2 mg Intravenous Once PRN Constance Goltz, MD      . DISCONTD: heparin injection 5,000 Units  5,000  Units Subcutaneous Q8H Carlota Raspberry, MD   5,000 Units at 11/24/10 [phone]  . DISCONTD: HYDROmorphone (DILAUDID) injection 0.25-0.5 mg  0.25-0.5 mg Intravenous Q5 min PRN Remonia Richter, MD      . DISCONTD: insulin aspart (novoLOG) injection 4 Units  4 Units Subcutaneous Once Lora Poteet Seay, PHARMD      . DISCONTD: insulin regular (HUMULIN R,NOVOLIN R) 100 units/mL injection 4 Units  4 Units Subcutaneous Once Rise Patience, [place]   4 Units at 11/22/10 2227  . DISCONTD: insulin regular (HUMULIN R,NOVOLIN R) 100 units/mL injection 4 Units  4 Units Subcutaneous Once Lora Poteet Seay, PHARMD      . DISCONTD: lactated ringers infusion 500 mL  500 mL Intravenous Continuous Shanker Ghimire 25 mL/hr at 11/24/10 1711 500 mL at 11/24/10 1711  . DISCONTD: lactated ringers infusion   Intravenous Continuous Remonia Richter, MD      . DISCONTD: lidocaine-prilocaine (EMLA) cream 1 application  1 application Topical Once Constance Goltz, MD      . DISCONTD: midazolam (VERSED) injection 0.5-2 mg  0.5-2 mg Intravenous PRN Constance Goltz, MD      . DISCONTD: midazolam (VERSED) injection 1-2 mg  1-2 mg Intravenous PRN Constance Goltz, MD      . DISCONTD: polyethylene glycol (MIRALAX / GLYCOLAX) packet 17 g  17 g Oral BID Shanker Ghimire   17 g at 11/25/10 1020  . DISCONTD: senna (SENOKOT) tablet 17.2 mg  2  tablet Oral BID Shanker Ghimire   17.2 mg at 11/25/10 2159  . DISCONTD: vancomycin (VANCOCIN) IVPB 1000 mg/200 mL premix  1,000 mg Intravenous Q8H Hilario Quarry Amend, PHARMD   1,000 mg at 11/24/10 1600  . DISCONTD: vancomycin (VANCOCIN) IVPB 1000 mg/200 mL premix  1,000 mg Intravenous Q8H Garth Bigness Frens, PHARMD   1,000 mg at 11/25/10 1026   No current outpatient prescriptions on file as of 11/27/2010.    Home: Home Living Lives With: Son;Daughter (66 yo and 76 yo) Receives Help From: Family (Parents have come from [place] to stay with her and kids) Type of Home: House Home Layout: Two level;1/2 bath on main level;Other (Comment) (staying on sofa on 1st level; doing bath at sink) Alternate Level Stairs-Rails: Right;Left (Rail on both sides until halfway up) Alternate Level Stairs-Number of Steps: one flight (14) Home Access: Stairs to enter Entrance Stairs-Rails: None Entrance Stairs-Number of Steps: 1 Bathroom Shower/Tub: Nurse, mental health Accessibility: Yes (Not sure) How Accessible: Accessible via walker Home Adaptive Equipment: None  Functional History: Prior Function Level of Independence: Independent with basic ADLs;Independent with homemaking with ambulation;Independent with transfers Driving: Yes Vocation: Other (comment) (Housewife) Comments: husband passed away 1 month ago Functional Status:  Mobility: Bed Mobility Bed Mobility: Yes Supine to Sit: 7: Independent;HOB flat Sitting - Scoot to Edge of Bed: 5: Supervision Sitting - Scoot to [place] of Bed Details (indicate cue type and reason): Cues for initiation and to keep RLE elevated.  Transfers Transfers: Yes Sit to Stand: 4: Min assist;From bed;From chair/3-in-1 Sit to Stand Details (indicate cue type and reason): Cues for hand placement and initiation, assist for anterior translation due to NWB on RLE.  2 trials. Stand to Sit: 4: Min assist;To chair/3-in-1;With armrests Stand to Sit  Details: Cues for hand placement and initiation, assist for control of descent. 2 trials.  Ambulation/Gait Ambulation/Gait: Yes Ambulation/Gait Assistance: 4: Min assist Ambulation/Gait Assistance Details (indicate cue type and reason): Cues for sequence and to keep RLE  elevated, min guard.  Ambulation Distance (Feet): 40 Feet (2 trials, 25 on first, 15 on second with seated rest ) Assistive device: Rolling walker Gait Pattern: Decreased step length - left (Difficulty maintaining NWB on RLE, TDWB at times)    ADL:    Cognition: Cognition Arousal/Alertness: Awake/alert Orientation Level: Oriented X4 Cognition Arousal/Alertness: Awake/alert Overall Cognitive Status: Appears within functional limits for tasks assessed Orientation Level: Oriented X4  Blood pressure 145/81, pulse 91, temperature 100.3 F (37.9 C), temperature source Oral, resp. rate 20, height 5\' 7"  (1.702 m), weight 90.719 kg (200 lb), last menstrual period 10/19/2010, SpO2 95.00%. Physical Exam  Constitutional: She is oriented to person, place, and time. She appears well-developed.  HENT:  Head: Normocephalic.  Eyes: Pupils are equal, round, and reactive to light.  Neck: Normal range of motion. Neck supple. No thyromegaly present.  Cardiovascular: Normal rate and regular rhythm.   Pulmonary/Chest: Breath sounds normal. She has no wheezes.  Abdominal: She exhibits no distension. There is no tenderness.  Neurological: She is alert and oriented to person, place, and time.  Skin:       Right foot surgical site dressed.Left foot with gauze dressing.  Psychiatric: She has a normal mood and affect.    Results for orders placed during the hospital encounter of 11/22/10 (from the past 24 hour(s))  CBC     Status: Abnormal   Collection Time   11/26/10 10:10 AM      Component Value Range   WBC 10.1  4.0 - 10.5 (K/uL)   RBC 2.93 (*) 3.87 - 5.11 (MIL/uL)   Hemoglobin 7.7 (*) 12.0 - 15.0 (g/dL)   HCT 40.9 (*) 81.1 -  46.0 (%)   MCV 81.9  78.0 - 100.0 (fL)   MCH 26.3  26.0 - 34.0 (pg)   MCHC 32.1  30.0 - 36.0 (g/dL)   RDW 91.4 (*) 78.2 - 15.5 (%)   Platelets 380  150 - 400 (K/uL)  BASIC METABOLIC PANEL     Status: Abnormal   Collection Time   11/26/10 10:10 AM      Component Value Range   Sodium 137  135 - 145 (mEq/L)   Potassium 3.6  3.5 - 5.1 (mEq/L)   Chloride 102  96 - 112 (mEq/L)   CO2 26  19 - 32 (mEq/L)   Glucose, Bld 143 (*) 70 - 99 (mg/dL)   BUN 6  6 - 23 (mg/dL)   Creatinine, Ser 9.56 (*) 0.50 - 1.10 (mg/dL)   Calcium 8.1 (*) 8.4 - 10.5 (mg/dL)   GFR calc non Af Amer 53 (*) >90 (mL/min)   GFR calc Af Amer 61 (*) >90 (mL/min)  GLUCOSE, CAPILLARY     Status: Abnormal   Collection Time   11/26/10 11:09 AM      Component Value Range   Glucose-Capillary 161 (*) 70 - 99 (mg/dL)   Comment 1 Notify RN     Comment 2 Documented in Chart    OCCULT BLOOD X 1 CARD TO LAB, STOOL     Status: Normal   Collection Time   11/26/10  3:40 PM      Component Value Range   Fecal Occult Bld NEGATIVE    VITAMIN B12     Status: Normal   Collection Time   11/26/10  4:25 PM      Component Value Range   Vitamin B-12 318  211 - 911 (pg/mL)  FOLATE     Status: Normal   Collection Time  11/26/10  4:25 PM      Component Value Range   Folate 3.6    IRON AND TIBC     Status: Abnormal   Collection Time   11/26/10  4:25 PM      Component Value Range   Iron <10 (*) 42 - 135 (ug/dL)   TIBC Not calculated due to Iron <10.  250 - 470 (ug/dL)   Saturation Ratios Not calculated due to Iron <10.  20 - 55 (%)   UIBC 206  125 - 400 (ug/dL)  FERRITIN     Status: Normal   Collection Time   11/26/10  4:25 PM      Component Value Range   Ferritin 120  10 - 291 (ng/mL)  RETICULOCYTES     Status: Abnormal   Collection Time   11/26/10  4:25 PM      Component Value Range   Retic Ct Pct 1.8  0.4 - 3.1 (%)   RBC. 3.18 (*) 3.87 - 5.11 (MIL/uL)   Retic Count, Manual 57.2  19.0 - 186.0 (K/uL)  TYPE AND SCREEN      Status: Normal   Collection Time   11/26/10  4:25 PM      Component Value Range   ABO/RH(D) B POS     Antibody Screen NEG     Sample Expiration 11/29/2010    ABO/RH     Status: Normal   Collection Time   11/26/10  4:31 PM      Component Value Range   ABO/RH(D) B POS    GLUCOSE, CAPILLARY     Status: Abnormal   Collection Time   11/26/10  4:47 PM      Component Value Range   Glucose-Capillary 140 (*) 70 - 99 (mg/dL)   Comment 1 Notify RN     Comment 2 Documented in Chart    GLUCOSE, CAPILLARY     Status: Abnormal   Collection Time   11/26/10  9:36 PM      Component Value Range   Glucose-Capillary 108 (*) 70 - 99 (mg/dL)  GLUCOSE, CAPILLARY     Status: Abnormal   Collection Time   11/27/10  6:51 AM      Component Value Range   Glucose-Capillary 105 (*) 70 - 99 (mg/dL)  CBC     Status: Abnormal   Collection Time   11/27/10  7:00 AM      Component Value Range   WBC 8.6  4.0 - 10.5 (K/uL)   RBC 2.89 (*) 3.87 - 5.11 (MIL/uL)   Hemoglobin 7.6 (*) 12.0 - 15.0 (g/dL)   HCT 57.8 (*) 46.9 - 46.0 (%)   MCV 81.3  78.0 - 100.0 (fL)   MCH 26.3  26.0 - 34.0 (pg)   MCHC 32.3  30.0 - 36.0 (g/dL)   RDW 62.9 (*) 52.8 - 15.5 (%)   Platelets 385  150 - 400 (K/uL)  BASIC METABOLIC PANEL     Status: Abnormal   Collection Time   11/27/10  7:00 AM      Component Value Range   Sodium 139  135 - 145 (mEq/L)   Potassium 3.4 (*) 3.5 - 5.1 (mEq/L)   Chloride 105  96 - 112 (mEq/L)   CO2 26  19 - 32 (mEq/L)   Glucose, Bld 100 (*) 70 - 99 (mg/dL)   BUN 6  6 - 23 (mg/dL)   Creatinine, Ser 4.13 (*) 0.50 - 1.10 (mg/dL)   Calcium 8.3 (*) 8.4 -  10.5 (mg/dL)   GFR calc non Af Amer 45 (*) >90 (mL/min)   GFR calc Af Amer 52 (*) >90 (mL/min)   No results found.  Assessment/Plan: Diagnosis: Right foot trans-metatarsal amputations due to peripheral vascular disease 1. Does the need for close, 24 hr/day medical supervision in concert with the patient's rehab needs make it unreasonable for this patient  to be served in a less intensive setting? No 2. Co-Morbidities requiring supervision/potential complications: Not applicable 3. Due to Not applicable, does the patient require 24 hr/day rehab nursing? No 4. Does the patient require coordinated care of a physician, rehab nurse, Not applicable to address physical and functional deficits in the context of the above medical diagnosis(es)? No Addressing deficits in the following areas: Not applicable 5. Can the patient actively participate in an intensive therapy program of at least 3 hrs of therapy per day at least 5 days per week? No 6. The potential for patient to make measurable gains while on inpatient rehab is Not applicable 7. Anticipated functional outcomes upon discharge from inpatients are not applicable PT, not applicable OT, not applicable SLP 8. Estimated rehab length of stay to reach the above functional goals is: Not applicable 9. Does the patient have adequate social supports to accommodate these discharge functional goals? No 10. Anticipated D/C setting: Home 11. Anticipated post D/C treatments: HH therapy 12. Overall Rehab/Functional Prognosis: good  RECOMMENDATIONS: This patient's condition is appropriate for continued rehabilitative care in the following setting: [facility] Patient has agreed to participate in recommended program. Yes Note that insurance prior authorization may be required for reimbursement for recommended care.  Comment: Functional status is too high level. She is  supervision assistance currently with transfers and ambulation using a rolling walker   ANGIULLI,DANIEL J. 11/27/2010

## 2010-11-27 NOTE — Progress Notes (Signed)
Physical Therapy Treatment Patient Details Name: DANITY SCHMELZER MRN: [id number] DOB: 01-19-1965 Today's Date: 11/27/2010  PT Assessment/Plan  PT - Assessment/Plan Comments on Treatment Session: Pt progressing with PT goals.  Fatigues fairly quickly with ambulation but very motivated and willing to participate.   PT Plan: Discharge plan remains appropriate PT Frequency: Min 5X/week Follow Up Recommendations: Home health PT;24 hour supervision/assistance;Inpatient Rehab Equipment Recommended: Rolling walker with 5" wheels PT Goals  Acute Rehab PT Goals PT Transfer Goal: Sit to Stand/Stand to Sit - Progress: Progressing toward goal PT Goal: Ambulate - Progress: Progressing toward goal  PT Treatment Precautions/Restrictions  Precautions Precautions: Fall Restrictions Weight Bearing Restrictions: Yes RLE Weight Bearing: Non weight bearing Other Position/Activity Restrictions: Pt. states that she has tripped over her feet and fallen several times in the past year Mobility (including Balance) Bed Mobility Bed Mobility: No Transfers Sit to Stand: Other (comment) (min guard) Sit to Stand Details (indicate cue type and reason): cues for hand placement Stand to Sit: Other (comment) (min guard) Stand to Sit Details: cues for hand placement, use of arms to control descent, safety Ambulation/Gait Ambulation/Gait Assistance: Other (comment) (min guard) Ambulation/Gait Assistance Details (indicate cue type and reason): guarding for safety due to NWBing RLE; did well with maintaining adherence to NWBing; pt c/o of LLE fatigue & discomfort  Ambulation Distance (Feet): 30 Feet Assistive device: Rolling walker    Exercise  General Exercises - Lower Extremity Long Arc Quad: AROM;Both;10 reps Hip ABduction/ADduction: AROM;10 reps;Both Straight Leg Raises: AROM;10 reps;Both End of Session PT - End of Session Equipment Utilized During Treatment: Gait belt Activity Tolerance: Patient  tolerated treatment well Patient left: in chair General Behavior During Session: [facility] for tasks performed Cognition: [facility] for tasks performed  Lara Mulch 11/27/2010, 3:33 PM [phone]

## 2010-11-27 NOTE — Progress Notes (Signed)
Evaluated for possible admission.  Please see [name] rehab consult recommending HH follow-up.  Doing well with therapies and should be able to discharge home with home health follow-up.  Pager [phone]

## 2010-11-27 NOTE — Progress Notes (Signed)
PATIENT DETAILS Name: [name] Age: 45 y.o. Sex: female Date of Birth: 1965-08-13 Admit Date: 11/22/2010 EAV:[id number],[id number] J, MD   Subjective: Better, less pain  Objective: Vital signs in last 24 hours: Patient Vitals for the past 24 hrs:  BP Temp Temp src Pulse Resp SpO2  11/27/10 1442 128/74 mmHg 98.4 F (36.9 C) Oral 92  17  100 %  11/27/10 0513 145/81 mmHg 100.3 F (37.9 C) Oral 91  20  95 %  11/26/10 2200 150/81 mmHg 99 F (37.2 C) Oral 92  24  99 %   Weight change:  Body mass index is 31.32 kg/(m^2).  Intake/Output from previous day: No intake or output data in the 24 hours ending 11/27/10 1517   PHYSICAL EXAM: Gen Exam: Awake and alert with clear speech.   Neck: Supple, No JVD.   Chest: B/L Clear.   CVS: S1 S2 Regular, no murmurs.  Abdomen: soft, BS +, non tender, non distended. No rebound. Extremities: no edema, lower extremities warm to touch. Status post right transmetatarsal and the patient, dressing seems to be dry. Neurologic: Non Focal.   Skin: No Rash.   Wounds: N/A.    CONSULTS:  orthopedic surgery  LAB RESULTS:  Basename 11/27/10 0700 11/26/10 1010  WBC 8.6 10.1  HGB 7.6* 7.7*  HCT 23.5* 24.0*  PLT 385 380   CMET CMP     Component Value Date/Time   NA 139 11/27/2010 0700   K 3.4* 11/27/2010 0700   CL 105 11/27/2010 0700   CO2 26 11/27/2010 0700   GLUCOSE 100* 11/27/2010 0700   BUN 6 11/27/2010 0700   CREATININE 1.41* 11/27/2010 0700   CALCIUM 8.3* 11/27/2010 0700   GFRNONAA 45* 11/27/2010 0700   GFRAA 52* 11/27/2010 0700    GFR Estimated Creatinine Clearance: 58.8 ml/min (by C-G formula based on Cr of 1.41). No results found for this basename: LIPASE:2,AMYLASE:2 in the last 72 hours No results found for this basename: CKTOTAL:3,CKMB:3,CKMBINDEX:3,TROPONINI:3 in the last 72 hours No results found for this basename: POCBNP:3 in the last 72 hours No results found for this basename: DDIMER:2 in the last 72 hours No results  found for this basename: HGBA1C:2 in the last 72 hours No results found for this basename: CHOL:2,HDL:2,LDLCALC:2,TRIG:2,CHOLHDL:2,LDLDIRECT:2 in the last 72 hours No results found for this basename: TSH,T4TOTAL,FREET3,T3FREE,THYROIDAB in the last 72 hours  Basename 11/26/10 1625  VITAMINB12 318  FOLATE 3.6  FERRITIN 120  TIBC Not calculated due to Iron <10.  IRON <10*  RETICCTPCT 1.8   COAGS No results found for this basename: PT:2,INR:2 in the last 72 hours MICROBIOLOGY: Recent Results (from the past 240 hour(s))  CULTURE, BLOOD (ROUTINE X 2)     Status: Normal (Preliminary result)   Collection Time   11/22/10  7:50 PM      Component Value Range Status Comment   Specimen Description BLOOD LEFT ARM   Final    Special Requests BOTTLES DRAWN AEROBIC AND ANAEROBIC 5CC   Final    Setup Time [id number]   Final    Culture     Final    Value:        BLOOD CULTURE RECEIVED NO GROWTH TO DATE CULTURE WILL BE HELD FOR 5 DAYS BEFORE ISSUING A FINAL NEGATIVE REPORT   Report Status PENDING   Incomplete   CULTURE, BLOOD (ROUTINE X 2)     Status: Normal (Preliminary result)   Collection Time   11/22/10  7:59 PM  Component Value Range Status Comment   Specimen Description BLOOD RIGHT ARM   Final    Special Requests BOTTLES DRAWN AEROBIC AND ANAEROBIC 10CC   Final    Setup Time [id number]   Final    Culture     Final    Value:        BLOOD CULTURE RECEIVED NO GROWTH TO DATE CULTURE WILL BE HELD FOR 5 DAYS BEFORE ISSUING A FINAL NEGATIVE REPORT   Report Status PENDING   Incomplete   TISSUE CULTURE     Status: Normal   Collection Time   11/24/10 12:15 PM      Component Value Range Status Comment   Specimen Description TISSUE FOOT RIGHT   Final    Special Requests     Final    Value: ON SWAB PT ON ZOSYN ANCEF VANCO TISSUE FROM IN BETWEEN TOES   Gram Stain     Final    Value: FEW WBC PRESENT, PREDOMINANTLY PMN     FEW GRAM POSITIVE COCCI IN PAIRS     FEW GRAM POSITIVE RODS     RARE  GRAM NEGATIVE RODS   Culture     Final    Value: MODERATE GROUP B STREP(S.AGALACTIAE)ISOLATED     Note: TESTING AGAINST S. AGALACTIAE NOT ROUTINELY PERFORMED DUE TO PREDICTABILITY OF AMP/PEN/VAN SUSCEPTIBILITY.   Report Status 11/27/2010 FINAL   Final   ANAEROBIC CULTURE     Status: Normal (Preliminary result)   Collection Time   11/24/10 12:15 PM      Component Value Range Status Comment   Specimen Description TISSUE FOOT RIGHT   Final    Special Requests     Final    Value: ON SWAB PT ON ZOSYN ANCEF VANCO TISSUE FROM IN BETWEEN TOES   Gram Stain PENDING   Incomplete    Culture     Final    Value: NO ANAEROBES ISOLATED; CULTURE IN PROGRESS FOR 5 DAYS   Report Status PENDING   Incomplete   TISSUE CULTURE     Status: Normal (Preliminary result)   Collection Time   11/24/10 12:15 PM      Component Value Range Status Comment   Specimen Description TISSUE FOOT RIGHT   Final    Special Requests CUP DEEP WOUND PT ON ZOSYN ANCEF VANCO   Final    Gram Stain PENDING   Incomplete    Culture Culture reincubated for better growth   Final    Report Status PENDING   Incomplete   ANAEROBIC CULTURE     Status: Normal (Preliminary result)   Collection Time   11/24/10 12:15 PM      Component Value Range Status Comment   Specimen Description TISSUE FOOT RIGHT   Final    Special Requests CUP DEEP WOUND PT ON ZOSYN ANCEF VANCO   Final    Gram Stain     Final    Value: RARE WBC PRESENT, PREDOMINANTLY PMN     NO ORGANISMS SEEN   Culture     Final    Value: NO ANAEROBES ISOLATED; CULTURE IN PROGRESS FOR 5 DAYS   Report Status PENDING   Incomplete   URINE CULTURE     Status: Normal   Collection Time   11/24/10 10:47 PM      Component Value Range Status Comment   Specimen Description URINE, RANDOM   Final    Special Requests NONE   Final    Setup Time [id number]   Final  Colony Count NO GROWTH   Final    Culture NO GROWTH   Final    Report Status 11/25/2010 FINAL   Final   CULTURE, BLOOD  (SINGLE)     Status: Normal (Preliminary result)   Collection Time   11/25/10 12:06 AM      Component Value Range Status Comment   Specimen Description BLOOD RIGHT ARM   Final    Special Requests BOTTLES DRAWN AEROBIC AND ANAEROBIC 10CC EACH   Final    Setup Time [id number]   Final    Culture     Final    Value:        BLOOD CULTURE RECEIVED NO GROWTH TO DATE CULTURE WILL BE HELD FOR 5 DAYS BEFORE ISSUING A FINAL NEGATIVE REPORT   Report Status PENDING   Incomplete     RADIOLOGY STUDIES/RESULTS: Dg Skull 1-3 Views  11/23/2010  *RADIOLOGY REPORT*  Clinical Data: Cranial surgery in the 1980s.Pre MRI screening.  SKULL - 1-3 VIEW  Comparison: None.  Findings: Stable dual sutures are present in the skull with numerous vascular clips along the right cerebral hemisphere and inner table of the skull.  These are of unknown composition. Because of uncertainty of the properties of the clips, MRI is contraindicated.  IMPRESSION: Postoperative changes of the right skull with numerous surgical clips and metallic sutures.  Original Report Authenticated By: Andreas Newport, M.D.   Dg Chest Port 1 View  11/24/2010  *RADIOLOGY REPORT*  Clinical Data: Fever.  PORTABLE CHEST - 1 VIEW 11/24/2010 2324 hours:  Comparison: None.  Findings: Markedly suboptimal inspiration which accounts for crowded bronchovascular markings diffusely and accentuates cardiac silhouette.  Taking this into account, cardiac silhouette likely mildly enlarged and lungs clear.  No visible pleural effusions.  IMPRESSION: Markedly suboptimal inspiration.  Mild cardiomegaly.  No acute cardiopulmonary disease.  Original Report Authenticated By: Arnell Sieving, M.D.   Dg Foot Complete Right  11/22/2010  *RADIOLOGY REPORT*  Clinical Data: Diabetic foot ulcers.  RIGHT FOOT COMPLETE - 3+ VIEW 11/22/2010:  Comparison: None.  Findings: Marked soft tissue swelling and gas within the soft tissues of the dorsum of the foot overlying the second MTP  joint. No radiographic evidence of underlying osteomyelitis.  Marked soft tissue swelling involving the great toe and second toe.  No evidence of acute, subacute, or healed fractures.  Bone mineral density well preserved.  Moderate sized plantar calcaneal spur. Enthesopathy at the insertion of the Achilles tendon on the posterior calcaneus.  IMPRESSION: Marked soft tissue swelling and gas within soft tissue ulcers of the dorsum of the foot distally.  No acute osseous abnormality, specifically, no evidence of osteomyelitis.  Moderate sized plantar calcaneal spur.  Original Report Authenticated By: Arnell Sieving, M.D.    MEDICATIONS: Scheduled Meds:    . docusate sodium  100 mg Oral BID  . ferrous sulfate  325 mg Oral TID WC  . heparin  5,000 Units Subcutaneous Q8H  . insulin aspart  0-15 Units Subcutaneous TID WC  . insulin aspart  0-5 Units Subcutaneous QHS  . insulin glargine  15 Units Subcutaneous Daily  . oxymetazoline  2 spray Each Nare Once  . piperacillin-tazobactam (ZOSYN)  IV  3.375 g Intravenous Q8H  . vancomycin  1,000 mg Intravenous Q12H   Continuous Infusions:    . sodium chloride 20 mL/hr at 11/25/10 0746   PRN Meds:.acetaminophen, acetaminophen, ondansetron (ZOFRAN) IV, ondansetron, oxyCODONE, senna, sodium phosphate  Antibiotics: Anti-infectives     Start  Dose/Rate Route Frequency Ordered Stop   11/25/10 2200   vancomycin (VANCOCIN) IVPB 1000 mg/200 mL premix        1,000 mg 200 mL/hr over 60 Minutes Intravenous Every 12 hours 11/25/10 1424     11/24/10 1800   vancomycin (VANCOCIN) IVPB 1000 mg/200 mL premix  Status:  Discontinued        1,000 mg 200 mL/hr over 60 Minutes Intravenous Every 8 hours 11/24/10 1650 11/25/10 1424   11/24/10 0800   ceFAZolin (ANCEF) IVPB 2 g/50 mL premix  Status:  Discontinued        2 g 100 mL/hr over 30 Minutes Intravenous 60 min pre-op 11/23/10 2040 11/24/10 1554   11/23/10 0600   vancomycin (VANCOCIN) IVPB 1000 mg/200 mL  premix  Status:  Discontinued        1,000 mg 200 mL/hr over 60 Minutes Intravenous Every 8 hours 11/23/10 0056 11/24/10 1448   11/23/10 0600   piperacillin-tazobactam (ZOSYN) IVPB 3.375 g        3.375 g 12.5 mL/hr over 240 Minutes Intravenous 3 times per day 11/23/10 0056     11/22/10 1945   vancomycin (VANCOCIN) IVPB 1000 mg/200 mL premix        1,000 mg 200 mL/hr over 60 Minutes Intravenous  Once 11/22/10 1940 11/22/10 2328   11/22/10 1945   piperacillin-tazobactam (ZOSYN) IVPB 3.375 g        3.375 g 12.5 mL/hr over 240 Minutes Intravenous  Once 11/22/10 1940 11/23/10 0044          Assessment/Plan: Patient Active Hospital Problem List: Diabetic foot ulcer associated with type 2 diabetes mellitus   Assessment: Status post right trans-metatarsal and the patient, POD #3   Plan: Continue with vancomycin and Zosyn. Intraoperative tissue cultures are still pending. If cultures continue to be negative, then will need to discuss with infectious disease regarding empiric antibiotics.  Hypertension    Assessment: Controlled without need for antihypertensives.    Plan: Monitor for now-still no need for any antihypertensive medications  Diabetes mellitus    Assessment: Uncontrolled, HbA1c 12.4.    Plan: Continue with Lantus and NovoLog. Will adjust depending on CBG readings.   Anemia   Assessment: Believe most of this is chronic, patient apparently gives a history of menorrhagia. No overt evidence of blood loss.   Plan: has Fe Deficiency, FOBT negative, has long h/o menorrhagia, will get a inpatient pelvic ultrasound and transvaginal ultrasound. Colonoscopy as outpatient.  Disposition: Remain inpatient  DVT Prophylaxis: Heparin  Code Status: Full code   Maretta Bees, MD. 11/27/2010, 3:17 PM

## 2010-11-28 DIAGNOSIS — M869 Osteomyelitis, unspecified: Secondary | ICD-10-CM

## 2010-11-28 DIAGNOSIS — I96 Gangrene, not elsewhere classified: Secondary | ICD-10-CM

## 2010-11-28 LAB — TYPE AND SCREEN
ABO/RH(D): B POS
Antibody Screen: NEGATIVE
Unit division: 0

## 2010-11-28 LAB — GLUCOSE, CAPILLARY
Glucose-Capillary: 108 mg/dL — ABNORMAL HIGH (ref 70–99)
Glucose-Capillary: 107 mg/dL — ABNORMAL HIGH (ref 70–99)
Glucose-Capillary: 103 mg/dL — ABNORMAL HIGH (ref 70–99)
Glucose-Capillary: 90 mg/dL (ref 70–99)
Glucose-Capillary: 134 mg/dL — ABNORMAL HIGH (ref 70–99)

## 2010-11-28 LAB — CBC
HCT: 28.3 % — ABNORMAL LOW (ref 36.0–46.0)
Hemoglobin: 9.3 g/dL — ABNORMAL LOW (ref 12.0–15.0)
MCH: 26.7 pg (ref 26.0–34.0)
MCHC: 32.9 g/dL (ref 30.0–36.0)
MCV: 81.3 fL (ref 78.0–100.0)
Platelets: 435 10*3/uL — ABNORMAL HIGH (ref 150–400)
RBC: 3.48 MIL/uL — ABNORMAL LOW (ref 3.87–5.11)
RDW: 17.8 % — ABNORMAL HIGH (ref 11.5–15.5)
WBC: 9.5 10*3/uL (ref 4.0–10.5)

## 2010-11-28 LAB — SEDIMENTATION RATE: Sed Rate: >140 mm/hr — ABNORMAL HIGH (ref 0–22)

## 2010-11-28 LAB — BASIC METABOLIC PANEL
BUN: 6 mg/dL (ref 6–23)
CO2: 25 mEq/L (ref 19–32)
Calcium: 8.7 mg/dL (ref 8.4–10.5)
Chloride: 101 mEq/L (ref 96–112)
Creatinine, Ser: 1.4 mg/dL — ABNORMAL HIGH (ref 0.50–1.10)
GFR calc Af Amer: 52 mL/min — ABNORMAL LOW (ref >90)
GFR calc non Af Amer: 45 mL/min — ABNORMAL LOW (ref >90)
Glucose, Bld: 94 mg/dL (ref 70–99)
Potassium: 3.3 mEq/L — ABNORMAL LOW (ref 3.5–5.1)
Sodium: 137 mEq/L (ref 135–145)

## 2010-11-28 LAB — TISSUE CULTURE

## 2010-11-28 LAB — C-REACTIVE PROTEIN: CRP: 4.27 mg/dL — ABNORMAL HIGH (ref <0.60)

## 2010-11-28 LAB — VANCOMYCIN, TROUGH: Vancomycin Tr: 29.7 ug/mL (ref 10.0–20.0)

## 2010-11-28 LAB — HIV ANTIBODY (ROUTINE TESTING W REFLEX): HIV: NONREACTIVE

## 2010-11-28 MED ORDER — VANCOMYCIN HCL IN DEXTROSE 1-5 GM/200ML-% IV SOLN: 1000.0000 mg | INTRAVENOUS | Status: DC

## 2010-11-28 MED ORDER — OXYCODONE-ACETAMINOPHEN 5-325 MG PO TABS
1.0000 | ORAL_TABLET | Freq: Three times a day (TID) | ORAL | Status: DC | PRN
  Administered 2010-11-28 – 2010-11-29 (×2): 1 via ORAL
  Filled 2010-11-28 (×2): qty 1

## 2010-11-28 MED ORDER — AMOXICILLIN-POT CLAVULANATE 875-125 MG PO TABS
1.0000 | ORAL_TABLET | Freq: Two times a day (BID) | ORAL | Status: DC
  Administered 2010-11-28 – 2010-11-29 (×3): 1 via ORAL
  Filled 2010-11-28 (×4): qty 1

## 2010-11-28 MED ORDER — AMOXICILLIN-POT CLAVULANATE 875-125 MG PO TABS: 1.0000 | ORAL_TABLET | Freq: Two times a day (BID) | ORAL | Status: DC

## 2010-11-28 NOTE — Progress Notes (Signed)
PATIENT DETAILS Name: CLOTIEL TROOP Age: 45 y.o. Sex: female Date of Birth: 31-Dec-1965 Admit Date: 11/22/2010 [id number] J, MD   Subjective: Better, less pain  Objective: Vital signs in last 24 hours: Patient Vitals for the past 24 hrs:  BP Temp Temp src Pulse Resp SpO2  11/28/10 1447 130/78 mmHg 98.6 F (37 C) Oral 88  18  98 %  11/28/10 0630 173/93 mmHg 99.2 F (37.3 C) Oral 94  20  100 %  11/28/10 0100 164/85 mmHg 98.3 F (36.8 C) Oral 90  18  99 %  11/28/10 0042 115/68 mmHg 98.6 F (37 C) Oral 84  18  100 %  11/27/10 2200 120/78 mmHg 98 F (36.7 C) Oral 66  20  100 %  11/27/10 2155 115/68 mmHg 98 F (36.7 C) Oral 84  18  100 %  11/27/10 2059 123/65 mmHg 98.3 F (36.8 C) Oral 87  18  99 %   Weight change:  Body mass index is 31.32 kg/(m^2).  Intake/Output from previous day:  Intake/Output Summary (Last 24 hours) at 11/28/10 1619 Last data filed at 11/28/10 0655  Gross per 24 hour  Intake 3386.33 ml  Output      0 ml  Net 3386.33 ml    PHYSICAL EXAM: Gen Exam: Awake and alert with clear speech.  Not in any distress. Neck: Supple, No JVD.   Chest: B/L Clear.   CVS: S1 S2 Regular, no murmurs.  Abdomen: soft, BS +, non tender, non distended. No rebound. Extremities: no edema, lower extremities warm to touch. Status post right transmetatarsal and the patient, dressing seems to be dry. Neurologic: Non Focal.   Skin: No Rash.   Wounds: N/A.    CONSULTS:  orthopedic surgery Infectious disease  LAB RESULTS:  Basename 11/28/10 0630 11/27/10 0700  WBC 9.5 8.6  HGB 9.3* 7.6*  HCT 28.3* 23.5*  PLT 435* 385   CMET CMP     Component Value Date/Time   NA 137 11/28/2010 1348   K 3.3* 11/28/2010 1348   CL 101 11/28/2010 1348   CO2 25 11/28/2010 1348   GLUCOSE 94 11/28/2010 1348   BUN 6 11/28/2010 1348   CREATININE 1.40* 11/28/2010 1348   CALCIUM 8.7 11/28/2010 1348   GFRNONAA 45* 11/28/2010 1348   GFRAA 52* 11/28/2010 1348    GFR Estimated  Creatinine Clearance: 59.3 ml/min (by C-G formula based on Cr of 1.4). No results found for this basename: LIPASE:2,AMYLASE:2 in the last 72 hours No results found for this basename: CKTOTAL:3,CKMB:3,CKMBINDEX:3,TROPONINI:3 in the last 72 hours No results found for this basename: POCBNP:3 in the last 72 hours No results found for this basename: DDIMER:2 in the last 72 hours No results found for this basename: HGBA1C:2 in the last 72 hours No results found for this basename: CHOL:2,HDL:2,LDLCALC:2,TRIG:2,CHOLHDL:2,LDLDIRECT:2 in the last 72 hours No results found for this basename: TSH,T4TOTAL,FREET3,T3FREE,THYROIDAB in the last 72 hours  Basename 11/26/10 1625  VITAMINB12 318  FOLATE 3.6  FERRITIN 120  TIBC Not calculated due to Iron <10.  IRON <10*  RETICCTPCT 1.8   COAGS No results found for this basename: PT:2,INR:2 in the last 72 hours MICROBIOLOGY: Recent Results (from the past 240 hour(s))  CULTURE, BLOOD (ROUTINE X 2)     Status: Normal (Preliminary result)   Collection Time   11/22/10  7:50 PM      Component Value Range Status Comment   Specimen Description BLOOD LEFT ARM   Final    Special Requests BOTTLES  DRAWN AEROBIC AND ANAEROBIC 5CC   Final    Setup Time [id number]   Final    Culture     Final    Value:        BLOOD CULTURE RECEIVED NO GROWTH TO DATE CULTURE WILL BE HELD FOR 5 DAYS BEFORE ISSUING A FINAL NEGATIVE REPORT   Report Status PENDING   Incomplete   CULTURE, BLOOD (ROUTINE X 2)     Status: Normal (Preliminary result)   Collection Time   11/22/10  7:59 PM      Component Value Range Status Comment   Specimen Description BLOOD RIGHT ARM   Final    Special Requests BOTTLES DRAWN AEROBIC AND ANAEROBIC 10CC   Final    Setup Time [id number]   Final    Culture     Final    Value:        BLOOD CULTURE RECEIVED NO GROWTH TO DATE CULTURE WILL BE HELD FOR 5 DAYS BEFORE ISSUING A FINAL NEGATIVE REPORT   Report Status PENDING   Incomplete   TISSUE CULTURE      Status: Normal   Collection Time   11/24/10 12:15 PM      Component Value Range Status Comment   Specimen Description TISSUE FOOT RIGHT   Final    Special Requests     Final    Value: ON SWAB PT ON ZOSYN ANCEF VANCO TISSUE FROM IN BETWEEN TOES   Gram Stain     Final    Value: FEW WBC PRESENT, PREDOMINANTLY PMN     FEW GRAM POSITIVE COCCI IN PAIRS     FEW GRAM POSITIVE RODS     RARE GRAM NEGATIVE RODS   Culture     Final    Value: MODERATE GROUP B STREP(S.AGALACTIAE)ISOLATED     Note: TESTING AGAINST S. AGALACTIAE NOT ROUTINELY PERFORMED DUE TO PREDICTABILITY OF AMP/PEN/VAN SUSCEPTIBILITY.   Report Status 11/27/2010 FINAL   Final   ANAEROBIC CULTURE     Status: Normal (Preliminary result)   Collection Time   11/24/10 12:15 PM      Component Value Range Status Comment   Specimen Description TISSUE FOOT RIGHT   Final    Special Requests     Final    Value: ON SWAB PT ON ZOSYN ANCEF VANCO TISSUE FROM IN BETWEEN TOES   Gram Stain PENDING   Incomplete    Culture     Final    Value: NO ANAEROBES ISOLATED; CULTURE IN PROGRESS FOR 5 DAYS   Report Status PENDING   Incomplete   TISSUE CULTURE     Status: Normal   Collection Time   11/24/10 12:15 PM      Component Value Range Status Comment   Specimen Description TISSUE FOOT RIGHT   Final    Special Requests CUP DEEP WOUND PT ON ZOSYN ANCEF VANCO   Final    Gram Stain     Final    Value: RARE WBC PRESENT, PREDOMINANTLY PMN     NO ORGANISMS SEEN   Culture     Final    Value: FEW GROUP B STREP(S.AGALACTIAE)ISOLATED     Note: TESTING AGAINST S. AGALACTIAE NOT ROUTINELY PERFORMED DUE TO PREDICTABILITY OF AMP/PEN/VAN SUSCEPTIBILITY.   Report Status 11/28/2010 FINAL   Final   ANAEROBIC CULTURE     Status: Normal (Preliminary result)   Collection Time   11/24/10 12:15 PM      Component Value Range Status Comment   Specimen Description TISSUE FOOT  RIGHT   Final    Special Requests CUP DEEP WOUND PT ON ZOSYN ANCEF VANCO   Final    Gram Stain      Final    Value: RARE WBC PRESENT, PREDOMINANTLY PMN     NO ORGANISMS SEEN   Culture     Final    Value: NO ANAEROBES ISOLATED; CULTURE IN PROGRESS FOR 5 DAYS   Report Status PENDING   Incomplete   URINE CULTURE     Status: Normal   Collection Time   11/24/10 10:47 PM      Component Value Range Status Comment   Specimen Description URINE, RANDOM   Final    Special Requests NONE   Final    Setup Time [id number]   Final    Colony Count NO GROWTH   Final    Culture NO GROWTH   Final    Report Status 11/25/2010 FINAL   Final   CULTURE, BLOOD (SINGLE)     Status: Normal (Preliminary result)   Collection Time   11/25/10 12:06 AM      Component Value Range Status Comment   Specimen Description BLOOD RIGHT ARM   Final    Special Requests BOTTLES DRAWN AEROBIC AND ANAEROBIC 10CC EACH   Final    Setup Time [id number]   Final    Culture     Final    Value:        BLOOD CULTURE RECEIVED NO GROWTH TO DATE CULTURE WILL BE HELD FOR 5 DAYS BEFORE ISSUING A FINAL NEGATIVE REPORT   Report Status PENDING   Incomplete     RADIOLOGY STUDIES/RESULTS: Dg Skull 1-3 Views  11/23/2010  *RADIOLOGY REPORT*  Clinical Data: Cranial surgery in the 1980s.Pre MRI screening.  SKULL - 1-3 VIEW  Comparison: None.  Findings: Stable dual sutures are present in the skull with numerous vascular clips along the right cerebral hemisphere and inner table of the skull.  These are of unknown composition. Because of uncertainty of the properties of the clips, MRI is contraindicated.  IMPRESSION: Postoperative changes of the right skull with numerous surgical clips and metallic sutures.  Original Report Authenticated By: Andreas Newport, M.D.   Dg Chest Port 1 View  11/24/2010  *RADIOLOGY REPORT*  Clinical Data: Fever.  PORTABLE CHEST - 1 VIEW 11/24/2010 2324 hours:  Comparison: None.  Findings: Markedly suboptimal inspiration which accounts for crowded bronchovascular markings diffusely and accentuates cardiac  silhouette.  Taking this into account, cardiac silhouette likely mildly enlarged and lungs clear.  No visible pleural effusions.  IMPRESSION: Markedly suboptimal inspiration.  Mild cardiomegaly.  No acute cardiopulmonary disease.  Original Report Authenticated By: Arnell Sieving, M.D.   Dg Foot Complete Right  11/22/2010  *RADIOLOGY REPORT*  Clinical Data: Diabetic foot ulcers.  RIGHT FOOT COMPLETE - 3+ VIEW 11/22/2010:  Comparison: None.  Findings: Marked soft tissue swelling and gas within the soft tissues of the dorsum of the foot overlying the second MTP joint. No radiographic evidence of underlying osteomyelitis.  Marked soft tissue swelling involving the great toe and second toe.  No evidence of acute, subacute, or healed fractures.  Bone mineral density well preserved.  Moderate sized plantar calcaneal spur. Enthesopathy at the insertion of the Achilles tendon on the posterior calcaneus.  IMPRESSION: Marked soft tissue swelling and gas within soft tissue ulcers of the dorsum of the foot distally.  No acute osseous abnormality, specifically, no evidence of osteomyelitis.  Moderate sized plantar calcaneal spur.  Original Report Authenticated  By: Arnell Sieving, M.D.    MEDICATIONS: Scheduled Meds:    . acetaminophen  650 mg Oral Once  . amoxicillin-clavulanate  1 tablet Oral Q12H  . diphenhydrAMINE  25 mg Intravenous Once  . docusate sodium  100 mg Oral BID  . ferrous sulfate  325 mg Oral TID WC  . furosemide  20 mg Intravenous Once  . heparin  5,000 Units Subcutaneous Q8H  . insulin aspart  0-15 Units Subcutaneous TID WC  . insulin aspart  0-5 Units Subcutaneous QHS  . insulin glargine  15 Units Subcutaneous Daily  . oxymetazoline  2 spray Each Nare Once  . DISCONTD: amoxicillin-clavulanate  1 tablet Oral Q12H  . DISCONTD: piperacillin-tazobactam (ZOSYN)  IV  3.375 g Intravenous Q8H  . DISCONTD: vancomycin  1,000 mg Intravenous Q12H  . DISCONTD: vancomycin  1,000 mg Intravenous  Q24H   Continuous Infusions:    . DISCONTD: sodium chloride 20 mL/hr at 11/25/10 0746   PRN Meds:.acetaminophen, acetaminophen, ondansetron (ZOFRAN) IV, ondansetron, oxyCODONE-acetaminophen, senna, sodium phosphate, DISCONTD: oxyCODONE  Antibiotics: Anti-infectives     Start     Dose/Rate Route Frequency Ordered Stop   11/29/10 0400   vancomycin (VANCOCIN) IVPB 1000 mg/200 mL premix  Status:  Discontinued        1,000 mg 200 mL/hr over 60 Minutes Intravenous Every 24 hours 11/28/10 1235 11/28/10 1307   11/28/10 1330   amoxicillin-clavulanate (AUGMENTIN) 875-125 MG per tablet 1 tablet        1 tablet over 24 Days Oral Every 12 hours 11/28/10 1311     11/28/10 1315   amoxicillin-clavulanate (AUGMENTIN) 875-125 MG per tablet 1 tablet  Status:  Discontinued        1 tablet Oral Every 12 hours 11/28/10 1309 11/28/10 1311   11/25/10 2200   vancomycin (VANCOCIN) IVPB 1000 mg/200 mL premix  Status:  Discontinued        1,000 mg 200 mL/hr over 60 Minutes Intravenous Every 12 hours 11/25/10 1424 11/28/10 1226   11/24/10 1800   vancomycin (VANCOCIN) IVPB 1000 mg/200 mL premix  Status:  Discontinued        1,000 mg 200 mL/hr over 60 Minutes Intravenous Every 8 hours 11/24/10 1650 11/25/10 1424   11/24/10 0800   ceFAZolin (ANCEF) IVPB 2 g/50 mL premix  Status:  Discontinued        2 g 100 mL/hr over 30 Minutes Intravenous 60 min pre-op 11/23/10 2040 11/24/10 1554   11/23/10 0600   vancomycin (VANCOCIN) IVPB 1000 mg/200 mL premix  Status:  Discontinued        1,000 mg 200 mL/hr over 60 Minutes Intravenous Every 8 hours 11/23/10 0056 11/24/10 1448   11/23/10 0600   piperacillin-tazobactam (ZOSYN) IVPB 3.375 g  Status:  Discontinued        3.375 g 12.5 mL/hr over 240 Minutes Intravenous 3 times per day 11/23/10 0056 11/28/10 1307   11/22/10 1945   vancomycin (VANCOCIN) IVPB 1000 mg/200 mL premix        1,000 mg 200 mL/hr over 60 Minutes Intravenous  Once 11/22/10 1940 11/22/10 2328    11/22/10 1945   piperacillin-tazobactam (ZOSYN) IVPB 3.375 g        3.375 g 12.5 mL/hr over 240 Minutes Intravenous  Once 11/22/10 1940 11/23/10 0044          Assessment/Plan: Patient Active Hospital Problem List: Diabetic foot ulcer associated with type 2 diabetes mellitus   Assessment: Status post right trans-metatarsal and the patient,  POD #4   Plan: Appreciate infectious disease input. Will discontinue vancomycin and Zosyn. Switch to Augmentin.   Hypertension    Assessment: Controlled without need for antihypertensives.    Plan: Monitor for now-still no need for any antihypertensive medications  Diabetes mellitus    Assessment: Uncontrolled, HbA1c 12.4.    Plan: Continue with Lantus and NovoLog.  will have patient watch diabetic education videos. We have RN each patient how to inject insulin.   Anemia   Assessment: Believe most of this is chronic, patient apparently gives a history of menorrhagia. No overt evidence of blood loss.   Plan: patient received 1 unit of PRBC yesterday. Her pelvic ultrasound does show uterine fibroid. FOBT is negative. She will continue with iron supplementation. She will followup with her primary care Dr. for further workup and referral regarding GYN workup and possible colonoscopy to be done as an outpatient.   Disposition: Remain inpatient-discharge home tomorrow morning.   DVT Prophylaxis: Heparin  Code Status: Full code   Maretta Bees, MD. 11/28/2010, 4:19 PM

## 2010-11-28 NOTE — Progress Notes (Signed)
ANTIBIOTIC CONSULT NOTE - FOLLOW UP  Pharmacy Consult for Vancomycin and Zosyn Indication: Diabetic Foot Infection  No Known Allergies  Patient Measurements: Height: 5\' 7"  (170.2 cm) Weight: 200 lb (90.719 kg) IBW/kg (Calculated) : 61.6   Vital Signs: Temp: 99.2 F (37.3 C) (11/24 0630) Temp src: Oral (11/24 0630) BP: 173/93 mmHg (11/24 0630) Pulse Rate: 94  (11/24 0630) Intake/Output from previous day: 11/23 0701 - 11/24 0700 In: 3386.3 [I.V.:1278.3; Blood:350; IV Piggyback:1758] Out: -  Intake/Output from this shift:    Labs:  Basename 11/28/10 0630 11/27/10 0700 11/26/10 1010  WBC 9.5 8.6 10.1  HGB 9.3* 7.6* 7.7*  PLT 435* 385 380  LABCREA -- -- --  CREATININE -- 1.41* 1.23*   Estimated Creatinine Clearance: 58.8 ml/min (by C-G formula based on Cr of 1.41).  Basename 11/28/10 0923  VANCOTROUGH 29.7*  VANCOPEAK --  Drue Dun --  GENTTROUGH --  GENTPEAK --  GENTRANDOM --  TOBRATROUGH --  Nolen Mu --  TOBRARND --  AMIKACINPEAK --  AMIKACINTROU --  AMIKACIN --     Microbiology: Recent Results (from the past 720 hour(s))  CULTURE, BLOOD (ROUTINE X 2)     Status: Normal (Preliminary result)   Collection Time   11/22/10  7:50 PM      Component Value Range Status Comment   Specimen Description BLOOD LEFT ARM   Final    Special Requests BOTTLES DRAWN AEROBIC AND ANAEROBIC 5CC   Final    Setup Time [id number]   Final    Culture     Final    Value:        BLOOD CULTURE RECEIVED NO GROWTH TO DATE CULTURE WILL BE HELD FOR 5 DAYS BEFORE ISSUING A FINAL NEGATIVE REPORT   Report Status PENDING   Incomplete   CULTURE, BLOOD (ROUTINE X 2)     Status: Normal (Preliminary result)   Collection Time   11/22/10  7:59 PM      Component Value Range Status Comment   Specimen Description BLOOD RIGHT ARM   Final    Special Requests BOTTLES DRAWN AEROBIC AND ANAEROBIC 10CC   Final    Setup Time [id number]   Final    Culture     Final    Value:        BLOOD CULTURE  RECEIVED NO GROWTH TO DATE CULTURE WILL BE HELD FOR 5 DAYS BEFORE ISSUING A FINAL NEGATIVE REPORT   Report Status PENDING   Incomplete   TISSUE CULTURE     Status: Normal   Collection Time   11/24/10 12:15 PM      Component Value Range Status Comment   Specimen Description TISSUE FOOT RIGHT   Final    Special Requests     Final    Value: ON SWAB PT ON ZOSYN ANCEF VANCO TISSUE FROM IN BETWEEN TOES   Gram Stain     Final    Value: FEW WBC PRESENT, PREDOMINANTLY PMN     FEW GRAM POSITIVE COCCI IN PAIRS     FEW GRAM POSITIVE RODS     RARE GRAM NEGATIVE RODS   Culture     Final    Value: MODERATE GROUP B STREP(S.AGALACTIAE)ISOLATED     Note: TESTING AGAINST S. AGALACTIAE NOT ROUTINELY PERFORMED DUE TO PREDICTABILITY OF AMP/PEN/VAN SUSCEPTIBILITY.   Report Status 11/27/2010 FINAL   Final   ANAEROBIC CULTURE     Status: Normal (Preliminary result)   Collection Time   11/24/10 12:15 PM  Component Value Range Status Comment   Specimen Description TISSUE FOOT RIGHT   Final    Special Requests     Final    Value: ON SWAB PT ON ZOSYN ANCEF VANCO TISSUE FROM IN BETWEEN TOES   Gram Stain PENDING   Incomplete    Culture     Final    Value: NO ANAEROBES ISOLATED; CULTURE IN PROGRESS FOR 5 DAYS   Report Status PENDING   Incomplete   TISSUE CULTURE     Status: Normal   Collection Time   11/24/10 12:15 PM      Component Value Range Status Comment   Specimen Description TISSUE FOOT RIGHT   Final    Special Requests CUP DEEP WOUND PT ON ZOSYN ANCEF VANCO   Final    Gram Stain     Final    Value: RARE WBC PRESENT, PREDOMINANTLY PMN     NO ORGANISMS SEEN   Culture     Final    Value: FEW GROUP B STREP(S.AGALACTIAE)ISOLATED     Note: TESTING AGAINST S. AGALACTIAE NOT ROUTINELY PERFORMED DUE TO PREDICTABILITY OF AMP/PEN/VAN SUSCEPTIBILITY.   Report Status 11/28/2010 FINAL   Final   ANAEROBIC CULTURE     Status: Normal (Preliminary result)   Collection Time   11/24/10 12:15 PM      Component  Value Range Status Comment   Specimen Description TISSUE FOOT RIGHT   Final    Special Requests CUP DEEP WOUND PT ON ZOSYN ANCEF VANCO   Final    Gram Stain     Final    Value: RARE WBC PRESENT, PREDOMINANTLY PMN     NO ORGANISMS SEEN   Culture     Final    Value: NO ANAEROBES ISOLATED; CULTURE IN PROGRESS FOR 5 DAYS   Report Status PENDING   Incomplete   URINE CULTURE     Status: Normal   Collection Time   11/24/10 10:47 PM      Component Value Range Status Comment   Specimen Description URINE, RANDOM   Final    Special Requests NONE   Final    Setup Time [id number]   Final    Colony Count NO GROWTH   Final    Culture NO GROWTH   Final    Report Status 11/25/2010 FINAL   Final   CULTURE, BLOOD (SINGLE)     Status: Normal (Preliminary result)   Collection Time   11/25/10 12:06 AM      Component Value Range Status Comment   Specimen Description BLOOD RIGHT ARM   Final    Special Requests BOTTLES DRAWN AEROBIC AND ANAEROBIC 10CC EACH   Final    Setup Time [id number]   Final    Culture     Final    Value:        BLOOD CULTURE RECEIVED NO GROWTH TO DATE CULTURE WILL BE HELD FOR 5 DAYS BEFORE ISSUING A FINAL NEGATIVE REPORT   Report Status PENDING   Incomplete     Anti-infectives     Start     Dose/Rate Route Frequency Ordered Stop   11/25/10 2200   vancomycin (VANCOCIN) IVPB 1000 mg/200 mL premix  Status:  Discontinued        1,000 mg 200 mL/hr over 60 Minutes Intravenous Every 12 hours 11/25/10 1424 11/28/10 1226   11/24/10 1800   vancomycin (VANCOCIN) IVPB 1000 mg/200 mL premix  Status:  Discontinued        1,000 mg 200  mL/hr over 60 Minutes Intravenous Every 8 hours 11/24/10 1650 11/25/10 1424   11/24/10 0800   ceFAZolin (ANCEF) IVPB 2 g/50 mL premix  Status:  Discontinued        2 g 100 mL/hr over 30 Minutes Intravenous 60 min pre-op 11/23/10 2040 11/24/10 1554   11/23/10 0600   vancomycin (VANCOCIN) IVPB 1000 mg/200 mL premix  Status:  Discontinued        1,000  mg 200 mL/hr over 60 Minutes Intravenous Every 8 hours 11/23/10 0056 11/24/10 1448   11/23/10 0600  piperacillin-tazobactam (ZOSYN) IVPB 3.375 g       3.375 g 12.5 mL/hr over 240 Minutes Intravenous 3 times per day 11/23/10 0056     11/22/10 1945   vancomycin (VANCOCIN) IVPB 1000 mg/200 mL premix        1,000 mg 200 mL/hr over 60 Minutes Intravenous  Once 11/22/10 1940 11/22/10 2328   11/22/10 1945  piperacillin-tazobactam (ZOSYN) IVPB 3.375 g       3.375 g 12.5 mL/hr over 240 Minutes Intravenous  Once 11/22/10 1940 11/23/10 0044          Assessment: Diabetic Foot Infection:  On Day #6 of Vancomycin and Zosyn.  Renal function is worsening as evidenced by rising SCr and supratherapeutic Vancomycin trough.  Note wound cultures isolated Group B Strep.  Goal of Therapy:  Vancomycin trough level 15-20 mcg/ml  Plan:  Change Vancomycin to 1gm IV q24h. Check BMET 11/25 to monitor renal function trend.  Madolyn Frieze 11/28/2010,12:26 PM

## 2010-11-28 NOTE — Consult Note (Signed)
Date of Admission:  11/22/2010  Date of Consult:  11/28/2010  Reason for Consult:osteomyelitis, diabetic foot infection with gangrene Referring Physician:Dr. [name] is an 45 y.o. female  With poorly controlled diabetes neuropathy,  , hypertension  Who presented to the emergency department with worsening pain fevers and  Diabetic foot ulcers with evidence of right forefoot gangrene. She was treated with intravenous vancomycin and Zosyn. Orthopedic surgery were consulted and Dr. [name] to the patient to the operating room on the 21st and performed  Right foot transmetatarsal amputation. He also performed a right percutaneous tendon Achilles length lengthening surgery.  Operative cultures on vancomycin and Zosyn have grown group B streptococci.  Consulted to assist in the management workup of this patient to have gangrene of the foot status post midfoot amputation.  She appears to have done relatively well postoperatively.  Past Medical History  Diagnosis Date  . Diabetes mellitus   . Hypertension   . Arthritis   . Neuropathy   . Diabetic foot ulcer associated with type 2 diabetes mellitus   . Intracerebral hemorrhage As a teenager     States she had burst blood vessel as teenager, now with resultant minor visual field and hearing deficits    Past Surgical History  Procedure Date  . Cesarean section   . Amputation 11/24/2010    Procedure: AMPUTATION FOOT;  Surgeon: [name], MD;  Location: [facility] OR;  Service: Orthopedics;  Laterality: Right;  Right  FOOT TRANS METATARSAL AMPUTATION  . I&d extremity 11/24/2010    Procedure: IRRIGATION AND DEBRIDEMENT EXTREMITY;  Surgeon: [name], MD;  Location: [facility] OR;  Service: Orthopedics;  Laterality: Left;  Debriedment of left plantar ulcer and trimming of toenails  ergies:   No Known Allergies  Medications: I have reviewed the patient's current medications. Anti-infectives     Start     Dose/Rate Route Frequency Ordered  Stop   11/29/10 0400   vancomycin (VANCOCIN) IVPB 1000 mg/200 mL premix  Status:  Discontinued        1,000 mg 200 mL/hr over 60 Minutes Intravenous Every 24 hours 11/28/10 1235 11/28/10 1307   11/28/10 1330   amoxicillin-clavulanate (AUGMENTIN) 875-125 MG per tablet 1 tablet        1 tablet over 24 Days Oral Every 12 hours 11/28/10 1311     11/28/10 1315   amoxicillin-clavulanate (AUGMENTIN) 875-125 MG per tablet 1 tablet  Status:  Discontinued        1 tablet Oral Every 12 hours 11/28/10 1309 11/28/10 1311   11/25/10 2200   vancomycin (VANCOCIN) IVPB 1000 mg/200 mL premix  Status:  Discontinued        1,000 mg 200 mL/hr over 60 Minutes Intravenous Every 12 hours 11/25/10 1424 11/28/10 1226   11/24/10 1800   vancomycin (VANCOCIN) IVPB 1000 mg/200 mL premix  Status:  Discontinued        1,000 mg 200 mL/hr over 60 Minutes Intravenous Every 8 hours 11/24/10 1650 11/25/10 1424   11/24/10 0800   ceFAZolin (ANCEF) IVPB 2 g/50 mL premix  Status:  Discontinued        2 g 100 mL/hr over 30 Minutes Intravenous 60 min pre-op 11/23/10 2040 11/24/10 1554   11/23/10 0600   vancomycin (VANCOCIN) IVPB 1000 mg/200 mL premix  Status:  Discontinued        1,000 mg 200 mL/hr over 60 Minutes Intravenous Every 8 hours 11/23/10 0056 11/24/10 1448   11/23/10 0600  piperacillin-tazobactam (ZOSYN) IVPB 3.375 g  Status:  Discontinued        3.375 g 12.5 mL/hr over 240 Minutes Intravenous 3 times per day 11/23/10 0056 11/28/10 1307   11/22/10 1945   vancomycin (VANCOCIN) IVPB 1000 mg/200 mL premix        1,000 mg 200 mL/hr over 60 Minutes Intravenous  Once 11/22/10 1940 11/22/10 2328   11/22/10 1945  piperacillin-tazobactam (ZOSYN) IVPB 3.375 g       3.375 g 12.5 mL/hr over 240 Minutes Intravenous  Once 11/22/10 1940 11/23/10 0044          Social History:  reports that she has never smoked. She does not have any smokeless tobacco history on file. She reports that she does not drink alcohol or use  illicit drugs.  Family History  Problem Relation Age of Onset  . Diabetes Mother   . Diabetes Father   . Heart disease Maternal Grandmother     As in HPI and primary teams notes otherwise 12 point review of systems is negative  Blood pressure 173/93, pulse 94, temperature 99.2 F (37.3 C), temperature source Oral, resp. rate 20, height 5\' 7"  (1.702 m), weight 200 lb (90.719 kg), last menstrual period 10/19/2010, SpO2 100.00%. General: Alert and awake, oriented x3, not in any acute distress. HEENT: anicteric sclera, pupils reactive to light and accommodation, EOMI CVS regular rate, normal r,  no murmur rubs or gallops Chest: clear to auscultation bilaterally, no wheezing, rales or rhonchi Abdomen: soft nontender, nondistended, normal bowel sounds, Extremities: right foot with dressing, left foot with thickened skin and some excoriation Neuro: intact strength, diminised sesnation in left foot   Results for orders placed during the hospital encounter of 11/22/10 (from the past 48 hour(s))  OCCULT BLOOD X 1 CARD TO LAB, STOOL     Status: Normal   Collection Time   11/26/10  3:40 PM      Component Value Range Comment   Fecal Occult Bld NEGATIVE     VITAMIN B12     Status: Normal   Collection Time   11/26/10  4:25 PM      Component Value Range Comment   Vitamin B-12 318  211 - 911 (pg/mL)   FOLATE     Status: Normal   Collection Time   11/26/10  4:25 PM      Component Value Range Comment   Folate 3.6     IRON AND TIBC     Status: Abnormal   Collection Time   11/26/10  4:25 PM      Component Value Range Comment   Iron <10 (*) 42 - 135 (ug/dL)    TIBC Not calculated due to Iron <10.  250 - 470 (ug/dL)    Saturation Ratios Not calculated due to Iron <10.  20 - 55 (%)    UIBC 206  125 - 400 (ug/dL)   FERRITIN     Status: Normal   Collection Time   11/26/10  4:25 PM      Component Value Range Comment   Ferritin 120  10 - 291 (ng/mL)   RETICULOCYTES     Status: Abnormal    Collection Time   11/26/10  4:25 PM      Component Value Range Comment   Retic Ct Pct 1.8  0.4 - 3.1 (%)    RBC. 3.18 (*) 3.87 - 5.11 (MIL/uL)    Retic Count, Manual 57.2  19.0 - 186.0 (K/uL)   TYPE AND SCREEN  Status: Normal   Collection Time   11/26/10  4:25 PM      Component Value Range Comment   ABO/RH(D) B POS      Antibody Screen NEG      Sample Expiration 11/29/2010      Unit Number [id number]      Blood Component Type RED CELLS,LR      Unit division 00      Status of Unit ISSUED,FINAL      Transfusion Status OK TO TRANSFUSE      Crossmatch Result Compatible     ABO/RH     Status: Normal   Collection Time   11/26/10  4:31 PM      Component Value Range Comment   ABO/RH(D) B POS     GLUCOSE, CAPILLARY     Status: Abnormal   Collection Time   11/26/10  4:47 PM      Component Value Range Comment   Glucose-Capillary 140 (*) 70 - 99 (mg/dL)    Comment 1 Notify RN      Comment 2 Documented in Chart     GLUCOSE, CAPILLARY     Status: Abnormal   Collection Time   11/26/10  9:36 PM      Component Value Range Comment   Glucose-Capillary 108 (*) 70 - 99 (mg/dL)   GLUCOSE, CAPILLARY     Status: Abnormal   Collection Time   11/27/10  6:51 AM      Component Value Range Comment   Glucose-Capillary 105 (*) 70 - 99 (mg/dL)   CBC     Status: Abnormal   Collection Time   11/27/10  7:00 AM      Component Value Range Comment   WBC 8.6  4.0 - 10.5 (K/uL)    RBC 2.89 (*) 3.87 - 5.11 (MIL/uL)    Hemoglobin 7.6 (*) 12.0 - 15.0 (g/dL)    HCT 82.9 (*) 56.2 - 46.0 (%)    MCV 81.3  78.0 - 100.0 (fL)    MCH 26.3  26.0 - 34.0 (pg)    MCHC 32.3  30.0 - 36.0 (g/dL)    RDW 13.0 (*) 86.5 - 15.5 (%)    Platelets 385  150 - 400 (K/uL)   BASIC METABOLIC PANEL     Status: Abnormal   Collection Time   11/27/10  7:00 AM      Component Value Range Comment   Sodium 139  135 - 145 (mEq/L)    Potassium 3.4 (*) 3.5 - 5.1 (mEq/L)    Chloride 105  96 - 112 (mEq/L)    CO2 26  19 - 32 (mEq/L)     Glucose, Bld 100 (*) 70 - 99 (mg/dL)    BUN 6  6 - 23 (mg/dL)    Creatinine, Ser 7.84 (*) 0.50 - 1.10 (mg/dL)    Calcium 8.3 (*) 8.4 - 10.5 (mg/dL)    GFR calc non Af Amer 45 (*) >90 (mL/min)    GFR calc Af Amer 52 (*) >90 (mL/min)   GLUCOSE, CAPILLARY     Status: Abnormal   Collection Time   11/27/10 11:33 AM      Component Value Range Comment   Glucose-Capillary 117 (*) 70 - 99 (mg/dL)   GLUCOSE, CAPILLARY     Status: Normal   Collection Time   11/27/10  4:32 PM      Component Value Range Comment   Glucose-Capillary 92  70 - 99 (mg/dL)   PREPARE RBC (CROSSMATCH)  Status: Normal   Collection Time   11/27/10  6:03 PM      Component Value Range Comment   Order Confirmation ORDER PROCESSED BY BLOOD BANK     GLUCOSE, CAPILLARY     Status: Abnormal   Collection Time   11/27/10 10:38 PM      Component Value Range Comment   Glucose-Capillary 108 (*) 70 - 99 (mg/dL)   GLUCOSE, CAPILLARY     Status: Abnormal   Collection Time   11/28/10  6:28 AM      Component Value Range Comment   Glucose-Capillary 107 (*) 70 - 99 (mg/dL)   CBC     Status: Abnormal   Collection Time   11/28/10  6:30 AM      Component Value Range Comment   WBC 9.5  4.0 - 10.5 (K/uL)    RBC 3.48 (*) 3.87 - 5.11 (MIL/uL)    Hemoglobin 9.3 (*) 12.0 - 15.0 (g/dL) POST TRANSFUSION SPECIMEN   HCT 28.3 (*) 36.0 - 46.0 (%)    MCV 81.3  78.0 - 100.0 (fL)    MCH 26.7  26.0 - 34.0 (pg)    MCHC 32.9  30.0 - 36.0 (g/dL)    RDW 91.4 (*) 78.2 - 15.5 (%)    Platelets 435 (*) 150 - 400 (K/uL)   VANCOMYCIN, TROUGH     Status: Abnormal   Collection Time   11/28/10  9:23 AM      Component Value Range Comment   Vancomycin Tr 29.7 (*) 10.0 - 20.0 (ug/mL)   GLUCOSE, CAPILLARY     Status: Abnormal   Collection Time   11/28/10 11:22 AM      Component Value Range Comment   Glucose-Capillary 103 (*) 70 - 99 (mg/dL)    Comment 1 Notify RN         Component Value Date/Time   SDES BLOOD RIGHT ARM 11/25/2010 0006   SPECREQUEST  BOTTLES DRAWN AEROBIC AND ANAEROBIC 10CC EACH 11/25/2010 0006   CULT        BLOOD CULTURE RECEIVED NO GROWTH TO DATE CULTURE WILL BE HELD FOR 5 DAYS BEFORE ISSUING A FINAL NEGATIVE REPORT 11/25/2010 0006   REPTSTATUS PENDING 11/25/2010 0006   US Transvaginal Non-ob  11/27/2010  *RADIOLOGY REPORT*  Clinical Data: Dysfunctional uterine bleeding.  Anemia.  TRANSABDOMINAL AND TRANSVAGINAL ULTRASOUND OF PELVIS Technique:  Both transabdominal and transvaginal ultrasound examinations of the pelvis were performed. Transabdominal technique was performed for global imaging of the pelvis including uterus, ovaries, adnexal regions, and pelvic cul-de-sac.  Comparison: None.   It was necessary to proceed with endovaginal exam following the transabdominal exam to visualize the endometrium.  Findings:  Uterus: 77 mm x 45 mm x 51 mm.  Posterior fundal myometrial fibroid is present measuring 11 mm x 10 mm x 6 mm.  Endometrium: 2 mm  Right ovary:  40 mm x 24 mm x 26 mm.  Normal physiologic appearance.  Left ovary: 36 mm x 10 mm x 13 mm.  Normal physiologic appearance.  Other findings: Small amount of simple free fluid is present in the anatomic pelvis.  IMPRESSION: 1.  Myometrial posterior fundal fibroid. 2.  No acute abnormality. 3.  Small amount of simple free fluid in the anatomic pelvis is probably physiologic.  Original Report Authenticated By: Andreas Newport, M.D.   US Pelvis Complete  11/27/2010  *RADIOLOGY REPORT*  Clinical Data: Dysfunctional uterine bleeding.  Anemia.  TRANSABDOMINAL AND TRANSVAGINAL ULTRASOUND OF PELVIS Technique:  Both transabdominal and  transvaginal ultrasound examinations of the pelvis were performed. Transabdominal technique was performed for global imaging of the pelvis including uterus, ovaries, adnexal regions, and pelvic cul-de-sac.  Comparison: None.   It was necessary to proceed with endovaginal exam following the transabdominal exam to visualize the endometrium.  Findings:  Uterus: 77  mm x 45 mm x 51 mm.  Posterior fundal myometrial fibroid is present measuring 11 mm x 10 mm x 6 mm.  Endometrium: 2 mm  Right ovary:  40 mm x 24 mm x 26 mm.  Normal physiologic appearance.  Left ovary: 36 mm x 10 mm x 13 mm.  Normal physiologic appearance.  Other findings: Small amount of simple free fluid is present in the anatomic pelvis.  IMPRESSION: 1.  Myometrial posterior fundal fibroid. 2.  No acute abnormality. 3.  Small amount of simple free fluid in the anatomic pelvis is probably physiologic.  Original Report Authenticated By: Andreas Newport, M.D.     Recent Results (from the past 720 hour(s))  CULTURE, BLOOD (ROUTINE X 2)     Status: Normal (Preliminary result)   Collection Time   11/22/10  7:50 PM      Component Value Range Status Comment   Specimen Description BLOOD LEFT ARM   Final    Special Requests BOTTLES DRAWN AEROBIC AND ANAEROBIC 5CC   Final    Setup Time [id number]   Final    Culture     Final    Value:        BLOOD CULTURE RECEIVED NO GROWTH TO DATE CULTURE WILL BE HELD FOR 5 DAYS BEFORE ISSUING A FINAL NEGATIVE REPORT   Report Status PENDING   Incomplete   CULTURE, BLOOD (ROUTINE X 2)     Status: Normal (Preliminary result)   Collection Time   11/22/10  7:59 PM      Component Value Range Status Comment   Specimen Description BLOOD RIGHT ARM   Final    Special Requests BOTTLES DRAWN AEROBIC AND ANAEROBIC 10CC   Final    Setup Time [id number]   Final    Culture     Final    Value:        BLOOD CULTURE RECEIVED NO GROWTH TO DATE CULTURE WILL BE HELD FOR 5 DAYS BEFORE ISSUING A FINAL NEGATIVE REPORT   Report Status PENDING   Incomplete   TISSUE CULTURE     Status: Normal   Collection Time   11/24/10 12:15 PM      Component Value Range Status Comment   Specimen Description TISSUE FOOT RIGHT   Final    Special Requests     Final    Value: ON SWAB PT ON ZOSYN ANCEF VANCO TISSUE FROM IN BETWEEN TOES   Gram Stain     Final    Value: FEW WBC PRESENT, PREDOMINANTLY  PMN     FEW GRAM POSITIVE COCCI IN PAIRS     FEW GRAM POSITIVE RODS     RARE GRAM NEGATIVE RODS   Culture     Final    Value: MODERATE GROUP B STREP(S.AGALACTIAE)ISOLATED     Note: TESTING AGAINST S. AGALACTIAE NOT ROUTINELY PERFORMED DUE TO PREDICTABILITY OF AMP/PEN/VAN SUSCEPTIBILITY.   Report Status 11/27/2010 FINAL   Final   ANAEROBIC CULTURE     Status: Normal (Preliminary result)   Collection Time   11/24/10 12:15 PM      Component Value Range Status Comment   Specimen Description TISSUE FOOT RIGHT   Final  Special Requests     Final    Value: ON SWAB PT ON ZOSYN ANCEF VANCO TISSUE FROM IN BETWEEN TOES   Gram Stain PENDING   Incomplete    Culture     Final    Value: NO ANAEROBES ISOLATED; CULTURE IN PROGRESS FOR 5 DAYS   Report Status PENDING   Incomplete   TISSUE CULTURE     Status: Normal   Collection Time   11/24/10 12:15 PM      Component Value Range Status Comment   Specimen Description TISSUE FOOT RIGHT   Final    Special Requests CUP DEEP WOUND PT ON ZOSYN ANCEF VANCO   Final    Gram Stain     Final    Value: RARE WBC PRESENT, PREDOMINANTLY PMN     NO ORGANISMS SEEN   Culture     Final    Value: FEW GROUP B STREP(S.AGALACTIAE)ISOLATED     Note: TESTING AGAINST S. AGALACTIAE NOT ROUTINELY PERFORMED DUE TO PREDICTABILITY OF AMP/PEN/VAN SUSCEPTIBILITY.   Report Status 11/28/2010 FINAL   Final   ANAEROBIC CULTURE     Status: Normal (Preliminary result)   Collection Time   11/24/10 12:15 PM      Component Value Range Status Comment   Specimen Description TISSUE FOOT RIGHT   Final    Special Requests CUP DEEP WOUND PT ON ZOSYN ANCEF VANCO   Final    Gram Stain     Final    Value: RARE WBC PRESENT, PREDOMINANTLY PMN     NO ORGANISMS SEEN   Culture     Final    Value: NO ANAEROBES ISOLATED; CULTURE IN PROGRESS FOR 5 DAYS   Report Status PENDING   Incomplete   URINE CULTURE     Status: Normal   Collection Time   11/24/10 10:47 PM      Component Value Range Status  Comment   Specimen Description URINE, RANDOM   Final    Special Requests NONE   Final    Setup Time [id number]   Final    Colony Count NO GROWTH   Final    Culture NO GROWTH   Final    Report Status 11/25/2010 FINAL   Final   CULTURE, BLOOD (SINGLE)     Status: Normal (Preliminary result)   Collection Time   11/25/10 12:06 AM      Component Value Range Status Comment   Specimen Description BLOOD RIGHT ARM   Final    Special Requests BOTTLES DRAWN AEROBIC AND ANAEROBIC 10CC EACH   Final    Setup Time [id number]   Final    Culture     Final    Value:        BLOOD CULTURE RECEIVED NO GROWTH TO DATE CULTURE WILL BE HELD FOR 5 DAYS BEFORE ISSUING A FINAL NEGATIVE REPORT   Report Status PENDING   Incomplete    Impression/Recommendation 45 yo with Diabetes, neuropathy and forefoot gangrene sp midfoot amputation with GBS isolated on intraop cultures.   Forefoot gangrene, osteo sp midfoot amputation: --I offered her IV antibiotics but she was against this --Given the culture data oral augmentin 875/125 bid for 24 more days is reasonable alternative --check esr, crp for reference  Screening: --check HIV ab  I will sign off for now please call with further questions  Thank you so much for this interesting consult,   Acey Lav 11/28/2010, 1:56 PM

## 2010-11-28 NOTE — Progress Notes (Signed)
Unable to connect Lab to collect CBC at 0245. Tried different numbers to connect but failed.

## 2010-11-28 NOTE — Progress Notes (Signed)
Pt throw up clear liquid vomitus with minimal sputum. Pt was having dry cough early this morning.

## 2010-11-28 NOTE — Progress Notes (Signed)
Physical Therapy Treatment Patient Details Name: HARPER VANDERVOORT MRN: [id number] DOB: [birth year] Today's Date: 11/28/2010  PT Assessment/Plan  PT - Assessment/Plan Comments on Treatment Session: Discussed possible d/c home this weekend and pt eager and slightly afraid.  States her parents are planning to stay with her until she is more moblile. PT Plan: Discharge plan remains appropriate PT Frequency: Min 5X/week Follow Up Recommendations: Home health PT Equipment Recommended: Rolling walker with 5" wheels;3 in 1 bedside comode PT Goals  Acute Rehab PT Goals PT Transfer Goal: Sit to Stand/Stand to Sit - Progress: Progressing toward goal PT Goal: Ambulate - Progress: Progressing toward goal  PT Treatment Precautions/Restrictions  Precautions Precautions: Fall Restrictions Weight Bearing Restrictions: Yes RLE Weight Bearing: Non weight bearing Other Position/Activity Restrictions: Pt. states that she has tripped over her feet and fallen several times in the past year Mobility (including Balance) Transfers Sit to Stand: 5: Supervision;From bed Sit to Stand Details (indicate cue type and reason): to RW, questioning cues for correct hand placement Stand to Sit: 5: Supervision;With armrests;To chair/3-in-1 Stand to Sit Details: vc for safest hand placement Ambulation/Gait Ambulation/Gait Assistance: 5: Supervision Ambulation/Gait Assistance Details (indicate cue type and reason): Pt continues to "land hard" on her LLE when hopping.  Noted MD ordered post-op shoe for Lt foot, yet had not been delivered.  Contacted [facility] and they will deliver shoe to her room. Ambulation Distance (Feet): 60 Feet Assistive device: Rolling walker    Exercise    End of Session PT - End of Session Activity Tolerance: Patient limited by fatigue;Other (comment) (standing rest breaks x 5 over 60 ft of ambulation) Patient left: in chair;with call bell in reach;with family/visitor present Nurse  Communication: Mobility status for ambulation General Behavior During Session: [facility] for tasks performed Cognition: [facility] for tasks performed  Libbie Bartley 11/28/2010, 12:00 PM

## 2010-11-28 NOTE — Progress Notes (Signed)
   CARE MANAGEMENT NOTE 11/28/2010  Patient:  [name], [name]   Account Number:  [id number]  Date Initiated:  11/23/2010  Documentation initiated by:  Donn Pierini  Subjective/Objective Assessment:   Pt admitted with DM foot ulcer with infection, plan for debridement vs amputation     Action/Plan:   PTA pt lived at home with children was independent with ADLs   Anticipated DC Date:  11/27/2010   Anticipated DC Plan:  HOME W HOME HEALTH SERVICES      DC Planning Services  CM consult      Choice offered to / List presented to:     DME arranged  [facility]      DME agency  [facility]     [facility] arranged  HH-1 RN  HH-2 PT  HH-3 OT      [facility] agency  [facility]   Status of service:  In process, will continue to follow Medicare Important Message given?   (If response is "NO", the following Medicare IM given date fields will be blank) Date Medicare IM given:   Date Additional Medicare IM given:    Discharge Disposition:  HOME W HOME HEALTH SERVICES  Per UR Regulation:  Reviewed for med. necessity/level of care/duration of stay  Comments:  PCP-Dr. Jillyn Hidden at [facility]  11/25/10 15:50 Letha Cape RN, [name] [phone] Patient has [facility] insurance, her PCP is Dr. Jillyn Hidden at [place]. Physical therapy rec's hhpt/ot, will add hhrn, patient chose [facility] from agency list.  Referral made to [facility] via [facility]. Lupita Leash notified as well.  Soc will begin 24-48 hrs post discharge.  11/23/10- 1520- Donn Pierini RN, [name] [phone] Pt for possible OR in am for debridement vs amputation. CM to follow for potential discharge nees

## 2010-11-28 NOTE — Progress Notes (Signed)
CRITICAL VALUE ALERT  Critical value received:  29.7-Vancomycin Trough  Date of notification:  11/28/10  Time of notification:  1124  Critical value read back:yes  Nurse who received alert:  K.Shamica Moree, RN  MD notified (1st page): Family Medicine 11/28/10    Time of first page:  1124  MD notified (2nd page):  Time of second page:  Responding MD:  Windell Norfolk, MD  Time MD responded:  1130  Also notified Pharmacy.

## 2010-11-29 LAB — BASIC METABOLIC PANEL
BUN: 7 mg/dL (ref 6–23)
CO2: 25 mEq/L (ref 19–32)
Calcium: 8.9 mg/dL (ref 8.4–10.5)
Chloride: 102 mEq/L (ref 96–112)
Creatinine, Ser: 1.39 mg/dL — ABNORMAL HIGH (ref 0.50–1.10)
GFR calc Af Amer: 53 mL/min — ABNORMAL LOW (ref >90)
GFR calc non Af Amer: 45 mL/min — ABNORMAL LOW (ref >90)
Glucose, Bld: 111 mg/dL — ABNORMAL HIGH (ref 70–99)
Potassium: 3.8 mEq/L (ref 3.5–5.1)
Sodium: 138 mEq/L (ref 135–145)

## 2010-11-29 LAB — CULTURE, BLOOD (ROUTINE X 2)
Culture  Setup Time: 201211190057
Culture  Setup Time: 201211190057
Culture: NO GROWTH
Culture: NO GROWTH

## 2010-11-29 LAB — GLUCOSE, CAPILLARY
Glucose-Capillary: 124 mg/dL — ABNORMAL HIGH (ref 70–99)
Glucose-Capillary: 126 mg/dL — ABNORMAL HIGH (ref 70–99)

## 2010-11-29 LAB — ANAEROBIC CULTURE

## 2010-11-29 MED ORDER — AMOXICILLIN-POT CLAVULANATE 875-125 MG PO TABS: 1.0000 | ORAL_TABLET | Freq: Two times a day (BID) | ORAL | Status: AC

## 2010-11-29 MED ORDER — INSULIN GLARGINE 100 UNIT/ML ~~LOC~~ SOLN: 15.0000 [IU] | Freq: Every day | SUBCUTANEOUS | Status: DC

## 2010-11-29 MED ORDER — ACETAMINOPHEN 325 MG PO TABS: 650.0000 mg | ORAL_TABLET | Freq: Four times a day (QID) | ORAL | Status: AC | PRN

## 2010-11-29 MED ORDER — FERROUS SULFATE 325 (65 FE) MG PO TABS: 325.0000 mg | ORAL_TABLET | Freq: Two times a day (BID) | ORAL | Status: DC

## 2010-11-29 MED ORDER — OXYMETAZOLINE HCL 0.05 % NA SOLN: 2.0000 | Freq: Once | NASAL | Status: AC

## 2010-11-29 MED ORDER — INSULIN ASPART 100 UNIT/ML ~~LOC~~ SOLN: SUBCUTANEOUS | Status: DC

## 2010-11-29 MED ORDER — INSULIN ASPART 100 UNIT/ML ~~LOC~~ SOLN: 0.0000 [IU] | Freq: Three times a day (TID) | SUBCUTANEOUS | Status: DC

## 2010-11-29 MED ORDER — PNEUMOCOCCAL VAC POLYVALENT 25 MCG/0.5ML IJ INJ
0.5000 mL | INJECTION | INTRAMUSCULAR | Status: DC
  Filled 2010-11-29: qty 0.5

## 2010-11-29 MED ORDER — OXYCODONE-ACETAMINOPHEN 5-325 MG PO TABS: 1.0000 | ORAL_TABLET | Freq: Three times a day (TID) | ORAL | Status: AC | PRN

## 2010-11-29 MED ORDER — ONDANSETRON HCL 4 MG PO TABS: 4.0000 mg | ORAL_TABLET | Freq: Three times a day (TID) | ORAL | Status: AC | PRN

## 2010-11-29 MED ORDER — "AQUACEL FOAM 4""X4"" EX PADS": 1.0000 "application " | MEDICATED_PAD | Freq: Every day | CUTANEOUS | Status: DC

## 2010-11-29 MED ORDER — SENNA 8.6 MG PO TABS: 2.0000 | ORAL_TABLET | Freq: Every day | ORAL | Status: DC | PRN

## 2010-11-29 NOTE — Progress Notes (Signed)
   CARE MANAGEMENT NOTE 11/29/2010  Patient:  [name], [name]   Account Number:  [id number]  Date Initiated:  11/23/2010  Documentation initiated by:  Donn Pierini  Subjective/Objective Assessment:   Pt admitted with DM foot ulcer with infection, plan for debridement vs amputation     Action/Plan:   PTA pt lived at home with children was independent with ADLs   Anticipated DC Date:  11/27/2010   Anticipated DC Plan:  HOME W HOME HEALTH SERVICES      DC Planning Services  CM consult      [facility] Choice  DURABLE MEDICAL EQUIPMENT  HOME HEALTH   Choice offered to / List presented to:     DME arranged  [facility]  3-N-1  HOSPITAL BED      DME agency  [facility]     [facility] arranged  HH-1 RN  HH-2 PT  HH-3 OT  HH-10 DISEASE MANAGEMENT      HH agency  [facility]   Status of service:  Completed, signed off Medicare Important Message given?  NO (If response is "NO", the following Medicare IM given date fields will be blank) Date Medicare IM given:   Date Additional Medicare IM given:    Discharge Disposition:  HOME W HOME HEALTH SERVICES  Per UR Regulation:  Reviewed for med. necessity/level of care/duration of stay  Comments:  PCP-Dr. Jillyn Hidden at [facility]  11/29/10 0845--Pt. to be discharged home today with [facility] services as well as DME.  Contacted [facility] to make aware of new DME hospital bed and 3-N-1 and to make aware of discharge today. Tera Mater, RN, [name] # [phone]  11/25/10 15:50 Letha Cape RN, [name] [phone] Patient has [facility] insurance, her PCP is Dr. Jillyn Hidden at [place]. Physical therapy rec's hhpt/ot, will add hhrn, patient chose [facility] from agency list.  Referral made to [facility] via [facility]. Lupita Leash notified as well.  Soc will begin 24-48 hrs post discharge.  11/23/10- 1520- Donn Pierini RN, [name] [phone] Pt for possible OR in am for debridement vs amputation. CM to follow for potential discharge nees

## 2010-11-29 NOTE — Discharge Summary (Signed)
PATIENT DETAILS Name: [name] Age: 45 y.o. Sex: female Date of Birth: [birth year] MRN: [id number]. Admit Date: 11/22/2010 Admitting Physician: Baltazar Najjar NFA:[id number] J, MD  PRIMARY DISCHARGE DIAGNOSIS:  Principal Problem:  *Diabetic foot ulcer associated with type 2 diabetes mellitus-with likely underlying osteomyelitis.  Active Problems:  Hypertension  Diabetes mellitus  Anemia      PAST MEDICAL HISTORY: Past Medical History  Diagnosis Date  . Diabetes mellitus   . Hypertension   . Arthritis   . Neuropathy   . Diabetic foot ulcer associated with type 2 diabetes mellitus   . Intracerebral hemorrhage As a teenager     States she had burst blood vessel as teenager, now with resultant minor visual field and hearing deficits    DISCHARGE MEDICATIONS: Current Discharge Medication List    START taking these medications   Details  acetaminophen (TYLENOL) 325 MG tablet Take 2 tablets (650 mg total) by mouth every 6 (six) hours as needed (or Fever >/= 101). Qty: 30 tablet    amoxicillin-clavulanate (AUGMENTIN) 875-125 MG per tablet Take 1 tablet by mouth every 12 (twelve) hours. Qty: 48 tablet, Refills: 0    ferrous sulfate 325 (65 FE) MG tablet Take 1 tablet (325 mg total) by mouth 2 (two) times daily. Qty: 60 tablet, Refills: 0    insulin aspart (NOVOLOG) 100 UNIT/ML injection Inject 0-15 Units into the skin 3 (three) times daily with meals. Qty: 1 vial, Refills: 0    insulin glargine (LANTUS) 100 UNIT/ML injection Inject 15 Units into the skin daily. Qty: 10 mL, Refills: 0    ondansetron (ZOFRAN) 4 MG tablet Take 1 tablet (4 mg total) by mouth every 8 (eight) hours as needed for nausea. Qty: 20 tablet, Refills: 0    oxyCODONE-acetaminophen (PERCOCET) 5-325 MG per tablet Take 1 tablet by mouth every 8 (eight) hours as needed. Qty: 20 tablet, Refills: 0    oxymetazoline (AFRIN) 0.05 % nasal spray Place 2 sprays into the nose once. Qty: 30 mL, Refills:  0    senna (SENOKOT) 8.6 MG TABS Take 2 tablets (17.2 mg total) by mouth daily as needed. Qty: 60 each, Refills: 0    Wound Dressings (AQUACEL FOAM 4"X4") PADS Apply 1 application topically daily. Qty: 10 each, Refills: 0      CONTINUE these medications which have NOT CHANGED   Details  aspirin EC 81 MG tablet Take 81 mg by mouth daily.        STOP taking these medications     naproxen sodium (ANAPROX) 220 MG tablet          BRIEF HPI:  See H&P, Labs, Consult and Test reports for all details in brief, patient was admitted for ulcerations and discharge from her right foot area. Patient does have a history of diabetes and has been noncompliant with medications for the last number of years. She was admitted to the hospitalist service, orthopedic was consulted and subsequently patient underwent a right transmetatarsal amputation. For further details please see the history and physical that was done on admission.  CONSULTATIONS:   ID-Dr. Algis Liming  orthopedic surgery-Dr. [name]  PERTINENT RADIOLOGIC STUDIES: Dg Skull 1-3 Views  11/23/2010  *RADIOLOGY REPORT*  Clinical Data: Cranial surgery in the 1980s.Pre MRI screening.  SKULL - 1-3 VIEW  Comparison: None.  Findings: Stable dual sutures are present in the skull with numerous vascular clips along the right cerebral hemisphere and inner table of the skull.  These are of unknown composition. Because of  uncertainty of the properties of the clips, MRI is contraindicated.  IMPRESSION: Postoperative changes of the right skull with numerous surgical clips and metallic sutures.  Original Report Authenticated By: Andreas Newport, M.D.   US Transvaginal Non-ob  11/27/2010  *RADIOLOGY REPORT*  Clinical Data: Dysfunctional uterine bleeding.  Anemia.  TRANSABDOMINAL AND TRANSVAGINAL ULTRASOUND OF PELVIS Technique:  Both transabdominal and transvaginal ultrasound examinations of the pelvis were performed. Transabdominal technique was performed for  global imaging of the pelvis including uterus, ovaries, adnexal regions, and pelvic cul-de-sac.  Comparison: None.   It was necessary to proceed with endovaginal exam following the transabdominal exam to visualize the endometrium.  Findings:  Uterus: 77 mm x 45 mm x 51 mm.  Posterior fundal myometrial fibroid is present measuring 11 mm x 10 mm x 6 mm.  Endometrium: 2 mm  Right ovary:  40 mm x 24 mm x 26 mm.  Normal physiologic appearance.  Left ovary: 36 mm x 10 mm x 13 mm.  Normal physiologic appearance.  Other findings: Small amount of simple free fluid is present in the anatomic pelvis.  IMPRESSION: 1.  Myometrial posterior fundal fibroid. 2.  No acute abnormality. 3.  Small amount of simple free fluid in the anatomic pelvis is probably physiologic.  Original Report Authenticated By: Andreas Newport, M.D.   US Pelvis Complete  11/27/2010  *RADIOLOGY REPORT*  Clinical Data: Dysfunctional uterine bleeding.  Anemia.  TRANSABDOMINAL AND TRANSVAGINAL ULTRASOUND OF PELVIS Technique:  Both transabdominal and transvaginal ultrasound examinations of the pelvis were performed. Transabdominal technique was performed for global imaging of the pelvis including uterus, ovaries, adnexal regions, and pelvic cul-de-sac.  Comparison: None.   It was necessary to proceed with endovaginal exam following the transabdominal exam to visualize the endometrium.  Findings:  Uterus: 77 mm x 45 mm x 51 mm.  Posterior fundal myometrial fibroid is present measuring 11 mm x 10 mm x 6 mm.  Endometrium: 2 mm  Right ovary:  40 mm x 24 mm x 26 mm.  Normal physiologic appearance.  Left ovary: 36 mm x 10 mm x 13 mm.  Normal physiologic appearance.  Other findings: Small amount of simple free fluid is present in the anatomic pelvis.  IMPRESSION: 1.  Myometrial posterior fundal fibroid. 2.  No acute abnormality. 3.  Small amount of simple free fluid in the anatomic pelvis is probably physiologic.  Original Report Authenticated By: Andreas Newport,  M.D.   Dg Chest Port 1 View  11/24/2010  *RADIOLOGY REPORT*  Clinical Data: Fever.  PORTABLE CHEST - 1 VIEW 11/24/2010 2324 hours:  Comparison: None.  Findings: Markedly suboptimal inspiration which accounts for crowded bronchovascular markings diffusely and accentuates cardiac silhouette.  Taking this into account, cardiac silhouette likely mildly enlarged and lungs clear.  No visible pleural effusions.  IMPRESSION: Markedly suboptimal inspiration.  Mild cardiomegaly.  No acute cardiopulmonary disease.  Original Report Authenticated By: Arnell Sieving, M.D.   Dg Foot Complete Right  11/22/2010  *RADIOLOGY REPORT*  Clinical Data: Diabetic foot ulcers.  RIGHT FOOT COMPLETE - 3+ VIEW 11/22/2010:  Comparison: None.  Findings: Marked soft tissue swelling and gas within the soft tissues of the dorsum of the foot overlying the second MTP joint. No radiographic evidence of underlying osteomyelitis.  Marked soft tissue swelling involving the great toe and second toe.  No evidence of acute, subacute, or healed fractures.  Bone mineral density well preserved.  Moderate sized plantar calcaneal spur. Enthesopathy at the insertion of the Achilles tendon on  the posterior calcaneus.  IMPRESSION: Marked soft tissue swelling and gas within soft tissue ulcers of the dorsum of the foot distally.  No acute osseous abnormality, specifically, no evidence of osteomyelitis.  Moderate sized plantar calcaneal spur.  Original Report Authenticated By: Arnell Sieving, M.D.     PERTINENT LAB RESULTS: CBC:  Basename 11/28/10 0630 11/27/10 0700  WBC 9.5 8.6  HGB 9.3* 7.6*  HCT 28.3* 23.5*  PLT 435* 385   CMET CMP     Component Value Date/Time   NA 138 11/29/2010 0500   K 3.8 11/29/2010 0500   CL 102 11/29/2010 0500   CO2 25 11/29/2010 0500   GLUCOSE 111* 11/29/2010 0500   BUN 7 11/29/2010 0500   CREATININE 1.39* 11/29/2010 0500   CALCIUM 8.9 11/29/2010 0500   GFRNONAA 45* 11/29/2010 0500   GFRAA 53*  11/29/2010 0500    GFR Estimated Creatinine Clearance: 59.7 ml/min (by C-G formula based on Cr of 1.39). No results found for this basename: LIPASE:2,AMYLASE:2 in the last 72 hours No results found for this basename: CKTOTAL:3,CKMB:3,CKMBINDEX:3,TROPONINI:3 in the last 72 hours No results found for this basename: POCBNP:3 in the last 72 hours No results found for this basename: DDIMER:2 in the last 72 hours No results found for this basename: HGBA1C:2 in the last 72 hours No results found for this basename: CHOL:2,HDL:2,LDLCALC:2,TRIG:2,CHOLHDL:2,LDLDIRECT:2 in the last 72 hours No results found for this basename: TSH,T4TOTAL,FREET3,T3FREE,THYROIDAB in the last 72 hours  Basename 11/26/10 1625  VITAMINB12 318  FOLATE 3.6  FERRITIN 120  TIBC Not calculated due to Iron <10.  IRON <10*  RETICCTPCT 1.8   Coags: No results found for this basename: PT:2,INR:2 in the last 72 hours Microbiology: Recent Results (from the past 240 hour(s))  CULTURE, BLOOD (ROUTINE X 2)     Status: Normal (Preliminary result)   Collection Time   11/22/10  7:50 PM      Component Value Range Status Comment   Specimen Description BLOOD LEFT ARM   Final    Special Requests BOTTLES DRAWN AEROBIC AND ANAEROBIC 5CC   Final    Setup Time [id number]   Final    Culture     Final    Value:        BLOOD CULTURE RECEIVED NO GROWTH TO DATE CULTURE WILL BE HELD FOR 5 DAYS BEFORE ISSUING A FINAL NEGATIVE REPORT   Report Status PENDING   Incomplete   CULTURE, BLOOD (ROUTINE X 2)     Status: Normal (Preliminary result)   Collection Time   11/22/10  7:59 PM      Component Value Range Status Comment   Specimen Description BLOOD RIGHT ARM   Final    Special Requests BOTTLES DRAWN AEROBIC AND ANAEROBIC 10CC   Final    Setup Time [id number]   Final    Culture     Final    Value:        BLOOD CULTURE RECEIVED NO GROWTH TO DATE CULTURE WILL BE HELD FOR 5 DAYS BEFORE ISSUING A FINAL NEGATIVE REPORT   Report Status PENDING    Incomplete   TISSUE CULTURE     Status: Normal   Collection Time   11/24/10 12:15 PM      Component Value Range Status Comment   Specimen Description TISSUE FOOT RIGHT   Final    Special Requests     Final    Value: ON SWAB PT ON ZOSYN ANCEF VANCO TISSUE FROM IN BETWEEN TOES   Gram  Stain     Final    Value: FEW WBC PRESENT, PREDOMINANTLY PMN     FEW GRAM POSITIVE COCCI IN PAIRS     FEW GRAM POSITIVE RODS     RARE GRAM NEGATIVE RODS   Culture     Final    Value: MODERATE GROUP B STREP(S.AGALACTIAE)ISOLATED     Note: TESTING AGAINST S. AGALACTIAE NOT ROUTINELY PERFORMED DUE TO PREDICTABILITY OF AMP/PEN/VAN SUSCEPTIBILITY.   Report Status 11/27/2010 FINAL   Final   ANAEROBIC CULTURE     Status: Normal (Preliminary result)   Collection Time   11/24/10 12:15 PM      Component Value Range Status Comment   Specimen Description TISSUE FOOT RIGHT   Final    Special Requests     Final    Value: ON SWAB PT ON ZOSYN ANCEF VANCO TISSUE FROM IN BETWEEN TOES   Gram Stain PENDING   Incomplete    Culture     Final    Value: NO ANAEROBES ISOLATED; CULTURE IN PROGRESS FOR 5 DAYS   Report Status PENDING   Incomplete   TISSUE CULTURE     Status: Normal   Collection Time   11/24/10 12:15 PM      Component Value Range Status Comment   Specimen Description TISSUE FOOT RIGHT   Final    Special Requests CUP DEEP WOUND PT ON ZOSYN ANCEF VANCO   Final    Gram Stain     Final    Value: RARE WBC PRESENT, PREDOMINANTLY PMN     NO ORGANISMS SEEN   Culture     Final    Value: FEW GROUP B STREP(S.AGALACTIAE)ISOLATED     Note: TESTING AGAINST S. AGALACTIAE NOT ROUTINELY PERFORMED DUE TO PREDICTABILITY OF AMP/PEN/VAN SUSCEPTIBILITY.   Report Status 11/28/2010 FINAL   Final   ANAEROBIC CULTURE     Status: Normal   Collection Time   11/24/10 12:15 PM      Component Value Range Status Comment   Specimen Description TISSUE FOOT RIGHT   Final    Special Requests CUP DEEP WOUND PT ON ZOSYN ANCEF VANCO   Final     Gram Stain     Final    Value: RARE WBC PRESENT, PREDOMINANTLY PMN     NO ORGANISMS SEEN   Culture NO ANAEROBES ISOLATED   Final    Report Status 11/29/2010 FINAL   Final   URINE CULTURE     Status: Normal   Collection Time   11/24/10 10:47 PM      Component Value Range Status Comment   Specimen Description URINE, RANDOM   Final    Special Requests NONE   Final    Setup Time [id number]   Final    Colony Count NO GROWTH   Final    Culture NO GROWTH   Final    Report Status 11/25/2010 FINAL   Final   CULTURE, BLOOD (SINGLE)     Status: Normal (Preliminary result)   Collection Time   11/25/10 12:06 AM      Component Value Range Status Comment   Specimen Description BLOOD RIGHT ARM   Final    Special Requests BOTTLES DRAWN AEROBIC AND ANAEROBIC 10CC EACH   Final    Setup Time [id number]   Final    Culture     Final    Value:        BLOOD CULTURE RECEIVED NO GROWTH TO DATE CULTURE WILL BE HELD FOR 5 DAYS BEFORE  ISSUING A FINAL NEGATIVE REPORT   Report Status PENDING   Incomplete      BRIEF HOSPITAL COURSE:   Principal Problem:  *Diabetic foot ulcer associated with type 2 diabetes mellitus -Patient was admitted to the hospitalist service for worsening ulcerations of the right foot area. She also had a ulcer on the plantar surface of her first metatarsal on the left foot. Upon further evaluation it was thought that she had findings mean of the right forefoot area. She was seen in consultation by Dr. [name] from the orthopedics service, he did advised the patient to undergo a right transmetatarsal amputation. After discussion with patient, patient subsequently underwent the procedure on 11/24/2010. Postoperatively she did well without major issues. During her hospital stay she was started on vancomycin and Zosyn. Intraoperative cultures were positive for group B streptococcus. Infectious disease was consulted for discharge antibiotics and duration. She was then seen in consultation  by Dr. Algis Liming from infectious disease, who did recommend  IV antibiotics, however the patient refused and Dr. Algis Liming then suggested Augmentin for another 24 more days. -Extensive home health services have been arranged, wound care instructions have been forwarded to the appropriate home health services. -Patient has been instructed will follow up with Dr. [name] in 1 week.  Active Problems:  Hypertension -Pressure is relatively well controlled without the need for further antihypertensive medications. -She will followup with her primary care doctor for continued monitoring and possible initiating antihypertensive therapy in the near future.   Diabetes mellitus -Patient has been noncompliant with medications. She was apparently diagnosed with diabetes during her last pregnancy. She was on insulin therapy then, after her pregnancy she was then transitioned to oral hypoglycemic agents. She claims that she last took her oral hypoglycemic agents 4-5 years ago, and just stopped taking it after that. -Her hemoglobin A1c is 11.8. -She has been started on Lantus and NovoLog insulin. -She has been provided with extensive diabetic education. -Home health RN has also been ordered for continued diabetic education as well. -She will followup with her primary care doctor for further optimization of her insulin regimen.   Anemia   -This is mostly chronic. Patient does give a history of menorrhagia. Her iron levels were less than 10. FOBT of the stools was negative. A inpatient pelvic and transvaginal ultrasound was done which showed a fibroid. -She was transfused 1 unit of PRBC during the stay, she has been started on iron supplementation. -There is no overt evidence of blood loss. -Patient has been instructed by me in detail that she would need further workup regarding this and further continued monitoring of her hemoglobin levels it she has been asked to see her primary care doctor and perhaps get a referral  to see a GYN physician or even a GI evaluation for a screening colonoscopy (fecal occult blood negative). She claims understanding.  History of noncompliance -Patient has been counseled multiple times by me regarding the nature of chronic disease, the importance of taking her medications as prescribed and importance of continued monitoring and followup. I have sat down with her multiple times during this admission and have told her that she needs to make sure that this time around she does all the things that are advised to her by her physicians. Her glycemic control will need to be optimized, she has also been told by me numerous times that if she continues to be noncompliant to medications and followup then she is at risk of developing gangrene and other  complication of diabetes resulting in further disability and even death. She has been told that wounds need to be managed appropriately otherwise she is at risk at losing further limbs. She claims understanding.   TODAY-DAY OF DISCHARGE:  Subjective:   [name] today has no headache,no chest abdominal pain,no new weakness tingling or numbness, feels much better wants to go home today.   Objective:   Blood pressure 151/87, pulse 90, temperature 99.3 F (37.4 C), temperature source Oral, resp. rate 18, height 5\' 7"  (1.702 m), weight 90.719 kg (200 lb), last menstrual period 10/19/2010, SpO2 97.00%. No intake or output data in the 24 hours ending 11/29/10 0835  Exam Awake Alert, Oriented *3, No new F.N deficits, Normal affect [place].AT,PERRAL Supple Neck,No JVD, No cervical lymphadenopathy appriciated.  Symmetrical Chest wall movement, Good air movement bilaterally, CTAB RRR,No Gallops,Rubs or new Murmurs, No Parasternal Heave +ve B.Sounds, Abd Soft, Non tender, No organomegaly appriciated, No rebound -guarding or rigidity. No Cyanosis, Clubbing or edema, No new Rash or bruise  DISPOSITION: Home home health services.  DISCHARGE  INSTRUCTIONS:    Follow-up Information    Follow up with FULP,CAMMIE J. Make an appointment in 5 days.   Contact information:   [address], [place] [address] [phone]       Follow up with [name], MD. Make an appointment in 1 week.   Contact information:   [address] [place] [address] [phone]          Total Time spent on discharge equals 45 minutes.  SignedJeoffrey Massed 11/29/2010 8:35 AM

## 2010-12-01 LAB — ANAEROBIC CULTURE

## 2010-12-01 LAB — CULTURE, BLOOD (SINGLE)
Culture  Setup Time: 201211210845
Culture: NO GROWTH

## 2010-12-14 ENCOUNTER — Other Ambulatory Visit: Payer: Self-pay | Admitting: Internal Medicine

## 2011-01-26 ENCOUNTER — Other Ambulatory Visit: Payer: Self-pay | Admitting: Internal Medicine

## 2011-06-23 ENCOUNTER — Inpatient Hospital Stay (HOSPITAL_COMMUNITY): Admission: AD | Admit: 2011-06-23 | Payer: 59 | Source: Ambulatory Visit | Admitting: Orthopedic Surgery

## 2011-06-24 ENCOUNTER — Encounter (HOSPITAL_COMMUNITY): Payer: Self-pay | Admitting: Pharmacy Technician

## 2011-06-29 ENCOUNTER — Ambulatory Visit (HOSPITAL_COMMUNITY): Admission: RE | Admit: 2011-06-29 | Payer: 59 | Source: Ambulatory Visit | Admitting: Orthopedic Surgery

## 2011-06-29 ENCOUNTER — Encounter (HOSPITAL_COMMUNITY): Admission: RE | Payer: Self-pay | Source: Ambulatory Visit

## 2011-06-29 SURGERY — AMPUTATION BELOW KNEE
Anesthesia: General | Laterality: Right

## 2011-07-01 ENCOUNTER — Emergency Department (HOSPITAL_COMMUNITY): Payer: 59

## 2011-07-01 ENCOUNTER — Encounter (HOSPITAL_COMMUNITY): Payer: Self-pay | Admitting: Emergency Medicine

## 2011-07-01 ENCOUNTER — Inpatient Hospital Stay (HOSPITAL_COMMUNITY)
Admission: EM | Admit: 2011-07-01 | Discharge: 2011-07-09 | DRG: 853 | Disposition: A | Discharge status: Rehabilitation Facility | Payer: 59 | Source: Ambulatory Visit | Attending: Internal Medicine | Admitting: Internal Medicine

## 2011-07-01 DIAGNOSIS — M129 Arthropathy, unspecified: Secondary | ICD-10-CM | POA: Diagnosis present

## 2011-07-01 DIAGNOSIS — I619 Nontraumatic intracerebral hemorrhage, unspecified: Secondary | ICD-10-CM

## 2011-07-01 DIAGNOSIS — E119 Type 2 diabetes mellitus without complications: Secondary | ICD-10-CM

## 2011-07-01 DIAGNOSIS — H9190 Unspecified hearing loss, unspecified ear: Secondary | ICD-10-CM | POA: Diagnosis present

## 2011-07-01 DIAGNOSIS — A480 Gas gangrene: Secondary | ICD-10-CM

## 2011-07-01 DIAGNOSIS — I10 Essential (primary) hypertension: Secondary | ICD-10-CM

## 2011-07-01 DIAGNOSIS — Z794 Long term (current) use of insulin: Secondary | ICD-10-CM

## 2011-07-01 DIAGNOSIS — A419 Sepsis, unspecified organism: Principal | ICD-10-CM

## 2011-07-01 DIAGNOSIS — E11621 Type 2 diabetes mellitus with foot ulcer: Secondary | ICD-10-CM

## 2011-07-01 DIAGNOSIS — E1159 Type 2 diabetes mellitus with other circulatory complications: Secondary | ICD-10-CM | POA: Diagnosis present

## 2011-07-01 DIAGNOSIS — L97509 Non-pressure chronic ulcer of other part of unspecified foot with unspecified severity: Secondary | ICD-10-CM | POA: Diagnosis present

## 2011-07-01 DIAGNOSIS — M1990 Unspecified osteoarthritis, unspecified site: Secondary | ICD-10-CM

## 2011-07-01 DIAGNOSIS — N179 Acute kidney failure, unspecified: Secondary | ICD-10-CM

## 2011-07-01 DIAGNOSIS — H5340 Unspecified visual field defects: Secondary | ICD-10-CM | POA: Diagnosis present

## 2011-07-01 DIAGNOSIS — L089 Local infection of the skin and subcutaneous tissue, unspecified: Secondary | ICD-10-CM

## 2011-07-01 DIAGNOSIS — D649 Anemia, unspecified: Secondary | ICD-10-CM

## 2011-07-01 DIAGNOSIS — R112 Nausea with vomiting, unspecified: Secondary | ICD-10-CM | POA: Diagnosis present

## 2011-07-01 DIAGNOSIS — G629 Polyneuropathy, unspecified: Secondary | ICD-10-CM

## 2011-07-01 LAB — POCT I-STAT, CHEM 8
BUN: 16 mg/dL (ref 6–23)
Calcium, Ion: 1.01 mmol/L — ABNORMAL LOW (ref 1.12–1.32)
Chloride: 101 mEq/L (ref 96–112)
Creatinine, Ser: 1.6 mg/dL — ABNORMAL HIGH (ref 0.50–1.10)
Glucose, Bld: 203 mg/dL — ABNORMAL HIGH (ref 70–99)
HCT: 27 % — ABNORMAL LOW (ref 36.0–46.0)
Hemoglobin: 9.2 g/dL — ABNORMAL LOW (ref 12.0–15.0)
Potassium: 3.7 mEq/L (ref 3.5–5.1)
Sodium: 133 mEq/L — ABNORMAL LOW (ref 135–145)
TCO2: 17 mmol/L (ref 0–100)

## 2011-07-01 LAB — COMPREHENSIVE METABOLIC PANEL
ALT: 16 U/L (ref 0–35)
AST: 30 U/L (ref 0–37)
Albumin: 3 g/dL — ABNORMAL LOW (ref 3.5–5.2)
Alkaline Phosphatase: 96 U/L (ref 39–117)
BUN: 17 mg/dL (ref 6–23)
CO2: 19 mEq/L (ref 19–32)
Calcium: 8.8 mg/dL (ref 8.4–10.5)
Chloride: 92 mEq/L — ABNORMAL LOW (ref 96–112)
Creatinine, Ser: 1.36 mg/dL — ABNORMAL HIGH (ref 0.50–1.10)
GFR calc Af Amer: 54 mL/min — ABNORMAL LOW (ref >90)
GFR calc non Af Amer: 47 mL/min — ABNORMAL LOW (ref >90)
Glucose, Bld: 227 mg/dL — ABNORMAL HIGH (ref 70–99)
Potassium: 4 mEq/L (ref 3.5–5.1)
Sodium: 129 mEq/L — ABNORMAL LOW (ref 135–145)
Total Bilirubin: 0.3 mg/dL (ref 0.3–1.2)
Total Protein: 8.8 g/dL — ABNORMAL HIGH (ref 6.0–8.3)

## 2011-07-01 LAB — CBC
HCT: 30 % — ABNORMAL LOW (ref 36.0–46.0)
Hemoglobin: 9.8 g/dL — ABNORMAL LOW (ref 12.0–15.0)
MCH: 27 pg (ref 26.0–34.0)
MCHC: 32.7 g/dL (ref 30.0–36.0)
MCV: 82.6 fL (ref 78.0–100.0)
Platelets: 247 10*3/uL (ref 150–400)
RBC: 3.63 MIL/uL — ABNORMAL LOW (ref 3.87–5.11)
RDW: 15 % (ref 11.5–15.5)
WBC: 11.5 10*3/uL — ABNORMAL HIGH (ref 4.0–10.5)

## 2011-07-01 LAB — LACTIC ACID, PLASMA: Lactic Acid, Venous: 3.1 mmol/L — ABNORMAL HIGH (ref 0.5–2.2)

## 2011-07-01 MED ORDER — ACETAMINOPHEN 325 MG PO TABS
650.0000 mg | ORAL_TABLET | Freq: Once | ORAL | Status: AC
  Administered 2011-07-01: 975 mg via ORAL
  Filled 2011-07-01: qty 3

## 2011-07-01 MED ORDER — ONDANSETRON HCL 4 MG/2ML IJ SOLN
4.0000 mg | Freq: Once | INTRAMUSCULAR | Status: AC
  Administered 2011-07-01: 4 mg via INTRAVENOUS
  Filled 2011-07-01: qty 2

## 2011-07-01 MED ORDER — ONDANSETRON HCL 4 MG/2ML IJ SOLN
4.0000 mg | Freq: Once | INTRAMUSCULAR | Status: AC
  Administered 2011-07-01: 4 mg via INTRAVENOUS

## 2011-07-01 MED ORDER — ACETAMINOPHEN 650 MG RE SUPP
650.0000 mg | Freq: Once | RECTAL | Status: AC
  Administered 2011-07-01: 650 mg via RECTAL
  Filled 2011-07-01: qty 1

## 2011-07-01 MED ORDER — ONDANSETRON HCL 4 MG/2ML IJ SOLN
INTRAMUSCULAR | Status: AC
  Filled 2011-07-01: qty 2

## 2011-07-01 MED ORDER — SODIUM CHLORIDE 0.9 % IV BOLUS (SEPSIS)
1000.0000 mL | Freq: Once | INTRAVENOUS | Status: AC
  Administered 2011-07-01: 1000 mL via INTRAVENOUS

## 2011-07-01 MED ORDER — ACETAMINOPHEN 325 MG PO TABS: 975.0000 mg | ORAL_TABLET | Freq: Once | ORAL | Status: DC

## 2011-07-01 NOTE — ED Notes (Signed)
C/o R foot infection x 2 weeks. Partial foot amputation in November.

## 2011-07-01 NOTE — ED Notes (Signed)
Patient vomitted up tylenol at this time.

## 2011-07-02 ENCOUNTER — Inpatient Hospital Stay (HOSPITAL_COMMUNITY): Payer: 59 | Admitting: Anesthesiology

## 2011-07-02 ENCOUNTER — Other Ambulatory Visit: Payer: Self-pay

## 2011-07-02 ENCOUNTER — Encounter (HOSPITAL_COMMUNITY)
Admission: EM | Disposition: A | Discharge status: Rehabilitation Facility | Payer: Self-pay | Source: Ambulatory Visit | Attending: Internal Medicine

## 2011-07-02 ENCOUNTER — Encounter (HOSPITAL_COMMUNITY): Payer: Self-pay | Admitting: Anesthesiology

## 2011-07-02 DIAGNOSIS — E1169 Type 2 diabetes mellitus with other specified complication: Secondary | ICD-10-CM

## 2011-07-02 DIAGNOSIS — N179 Acute kidney failure, unspecified: Secondary | ICD-10-CM

## 2011-07-02 DIAGNOSIS — A480 Gas gangrene: Secondary | ICD-10-CM

## 2011-07-02 DIAGNOSIS — L98499 Non-pressure chronic ulcer of skin of other sites with unspecified severity: Secondary | ICD-10-CM

## 2011-07-02 DIAGNOSIS — A419 Sepsis, unspecified organism: Principal | ICD-10-CM

## 2011-07-02 DIAGNOSIS — D649 Anemia, unspecified: Secondary | ICD-10-CM

## 2011-07-02 HISTORY — PX: I&D EXTREMITY: SHX5045

## 2011-07-02 HISTORY — PX: AMPUTATION: SHX166

## 2011-07-02 HISTORY — PX: APPLICATION OF WOUND VAC: SHX5189

## 2011-07-02 LAB — BASIC METABOLIC PANEL
BUN: 15 mg/dL (ref 6–23)
CO2: 21 mEq/L (ref 19–32)
Calcium: 8.1 mg/dL — ABNORMAL LOW (ref 8.4–10.5)
Chloride: 100 mEq/L (ref 96–112)
Creatinine, Ser: 1.16 mg/dL — ABNORMAL HIGH (ref 0.50–1.10)
GFR calc Af Amer: 65 mL/min — ABNORMAL LOW (ref >90)
GFR calc non Af Amer: 56 mL/min — ABNORMAL LOW (ref >90)
Glucose, Bld: 185 mg/dL — ABNORMAL HIGH (ref 70–99)
Potassium: 3.4 mEq/L — ABNORMAL LOW (ref 3.5–5.1)
Sodium: 135 mEq/L (ref 135–145)

## 2011-07-02 LAB — CBC
HCT: 26.2 % — ABNORMAL LOW (ref 36.0–46.0)
HCT: 31.5 % — ABNORMAL LOW (ref 36.0–46.0)
Hemoglobin: 8.5 g/dL — ABNORMAL LOW (ref 12.0–15.0)
Hemoglobin: 10.2 g/dL — ABNORMAL LOW (ref 12.0–15.0)
MCH: 26.8 pg (ref 26.0–34.0)
MCH: 27 pg (ref 26.0–34.0)
MCHC: 32.4 g/dL (ref 30.0–36.0)
MCHC: 32.4 g/dL (ref 30.0–36.0)
MCV: 82.6 fL (ref 78.0–100.0)
MCV: 83.3 fL (ref 78.0–100.0)
Platelets: 200 10*3/uL (ref 150–400)
Platelets: 155 10*3/uL (ref 150–400)
RBC: 3.17 MIL/uL — ABNORMAL LOW (ref 3.87–5.11)
RBC: 3.78 MIL/uL — ABNORMAL LOW (ref 3.87–5.11)
RDW: 15.1 % (ref 11.5–15.5)
RDW: 15.4 % (ref 11.5–15.5)
WBC: 7.7 10*3/uL (ref 4.0–10.5)
WBC: 5.4 10*3/uL (ref 4.0–10.5)

## 2011-07-02 LAB — GLUCOSE, CAPILLARY
Glucose-Capillary: 185 mg/dL — ABNORMAL HIGH (ref 70–99)
Glucose-Capillary: 125 mg/dL — ABNORMAL HIGH (ref 70–99)
Glucose-Capillary: 122 mg/dL — ABNORMAL HIGH (ref 70–99)
Glucose-Capillary: 136 mg/dL — ABNORMAL HIGH (ref 70–99)
Glucose-Capillary: 127 mg/dL — ABNORMAL HIGH (ref 70–99)
Glucose-Capillary: 121 mg/dL — ABNORMAL HIGH (ref 70–99)
Glucose-Capillary: 105 mg/dL — ABNORMAL HIGH (ref 70–99)

## 2011-07-02 LAB — APTT: aPTT: 37 seconds (ref 24–37)

## 2011-07-02 LAB — PROTIME-INR
INR: 1.55 — ABNORMAL HIGH (ref 0.00–1.49)
Prothrombin Time: 18.9 seconds — ABNORMAL HIGH (ref 11.6–15.2)

## 2011-07-02 LAB — MRSA PCR SCREENING: MRSA by PCR: NEGATIVE

## 2011-07-02 LAB — PHOSPHORUS: Phosphorus: 3 mg/dL (ref 2.3–4.6)

## 2011-07-02 LAB — LACTIC ACID, PLASMA
Lactic Acid, Venous: 1.8 mmol/L (ref 0.5–2.2)
Lactic Acid, Venous: 1.1 mmol/L (ref 0.5–2.2)
Lactic Acid, Venous: 1 mmol/L (ref 0.5–2.2)

## 2011-07-02 LAB — MAGNESIUM: Magnesium: 1.7 mg/dL (ref 1.5–2.5)

## 2011-07-02 LAB — PROCALCITONIN: Procalcitonin: 0.82 ng/mL

## 2011-07-02 SURGERY — AMPUTATION BELOW KNEE
Anesthesia: General | Site: Leg Lower | Laterality: Right | Wound class: Clean

## 2011-07-02 MED ORDER — VANCOMYCIN HCL IN DEXTROSE 1-5 GM/200ML-% IV SOLN
1000.0000 mg | Freq: Two times a day (BID) | INTRAVENOUS | Status: DC
  Administered 2011-07-02 – 2011-07-08 (×14): 1000 mg via INTRAVENOUS
  Filled 2011-07-02 (×19): qty 200

## 2011-07-02 MED ORDER — SODIUM CHLORIDE 0.9 % IR SOLN
Status: DC | PRN
  Administered 2011-07-02: 3000 mL

## 2011-07-02 MED ORDER — ONDANSETRON HCL 4 MG/2ML IJ SOLN
INTRAMUSCULAR | Status: DC | PRN
  Administered 2011-07-02: 4 mg via INTRAVENOUS

## 2011-07-02 MED ORDER — FENTANYL CITRATE 0.05 MG/ML IJ SOLN
INTRAMUSCULAR | Status: AC
  Filled 2011-07-02: qty 2

## 2011-07-02 MED ORDER — FENTANYL CITRATE 0.05 MG/ML IJ SOLN
25.0000 ug | INTRAMUSCULAR | Status: DC | PRN
  Administered 2011-07-02 (×2): 25 ug via INTRAVENOUS

## 2011-07-02 MED ORDER — INSULIN ASPART 100 UNIT/ML ~~LOC~~ SOLN
4.0000 [IU] | Freq: Three times a day (TID) | SUBCUTANEOUS | Status: DC
  Administered 2011-07-03 – 2011-07-08 (×10): 4 [IU] via SUBCUTANEOUS

## 2011-07-02 MED ORDER — LACTATED RINGERS IV SOLN
INTRAVENOUS | Status: DC | PRN
  Administered 2011-07-02 (×2): via INTRAVENOUS

## 2011-07-02 MED ORDER — PIPERACILLIN-TAZOBACTAM 3.375 G IVPB 30 MIN
3.3750 g | Freq: Once | INTRAVENOUS | Status: AC
  Administered 2011-07-02: 3.375 g via INTRAVENOUS
  Filled 2011-07-02: qty 50

## 2011-07-02 MED ORDER — OXYCODONE HCL 5 MG PO TABS
5.0000 mg | ORAL_TABLET | ORAL | Status: DC | PRN
  Administered 2011-07-02 – 2011-07-06 (×12): 10 mg via ORAL
  Filled 2011-07-02 (×12): qty 2

## 2011-07-02 MED ORDER — GLYCOPYRROLATE 0.2 MG/ML IJ SOLN
INTRAMUSCULAR | Status: DC | PRN
  Administered 2011-07-02: 0.6 mg via INTRAVENOUS

## 2011-07-02 MED ORDER — CLINDAMYCIN PHOSPHATE 900 MG/50ML IV SOLN
900.0000 mg | Freq: Four times a day (QID) | INTRAVENOUS | Status: DC
  Administered 2011-07-02: 900 mg via INTRAVENOUS
  Filled 2011-07-02 (×4): qty 50

## 2011-07-02 MED ORDER — MIDAZOLAM HCL 5 MG/5ML IJ SOLN
INTRAMUSCULAR | Status: DC | PRN
  Administered 2011-07-02: 2 mg via INTRAVENOUS
  Administered 2011-07-02: 1 mg via INTRAVENOUS

## 2011-07-02 MED ORDER — CLINDAMYCIN PHOSPHATE 600 MG/50ML IV SOLN: 600.0000 mg | Freq: Once | INTRAVENOUS | Status: DC

## 2011-07-02 MED ORDER — INSULIN ASPART 100 UNIT/ML ~~LOC~~ SOLN
0.0000 [IU] | Freq: Three times a day (TID) | SUBCUTANEOUS | Status: DC
  Administered 2011-07-03: 2 [IU] via SUBCUTANEOUS
  Administered 2011-07-03: 3 [IU] via SUBCUTANEOUS
  Administered 2011-07-04 – 2011-07-06 (×3): 2 [IU] via SUBCUTANEOUS
  Administered 2011-07-07: 3 [IU] via SUBCUTANEOUS
  Administered 2011-07-07 – 2011-07-08 (×2): 2 [IU] via SUBCUTANEOUS

## 2011-07-02 MED ORDER — HYDROMORPHONE HCL PF 1 MG/ML IJ SOLN
0.5000 mg | INTRAMUSCULAR | Status: DC | PRN
  Administered 2011-07-02: 1 mg via INTRAVENOUS
  Administered 2011-07-02: 0.5 mg via INTRAVENOUS
  Administered 2011-07-03 – 2011-07-06 (×5): 1 mg via INTRAVENOUS
  Filled 2011-07-02 (×7): qty 1

## 2011-07-02 MED ORDER — VANCOMYCIN HCL IN DEXTROSE 1-5 GM/200ML-% IV SOLN
1000.0000 mg | Freq: Once | INTRAVENOUS | Status: AC
  Administered 2011-07-02: 1000 mg via INTRAVENOUS
  Filled 2011-07-02: qty 200

## 2011-07-02 MED ORDER — METOCLOPRAMIDE HCL 10 MG PO TABS
5.0000 mg | ORAL_TABLET | Freq: Three times a day (TID) | ORAL | Status: DC | PRN
  Filled 2011-07-02: qty 1

## 2011-07-02 MED ORDER — OXYCODONE HCL 5 MG PO TABS: 5.0000 mg | ORAL_TABLET | Freq: Once | ORAL | Status: DC | PRN

## 2011-07-02 MED ORDER — METOCLOPRAMIDE HCL 5 MG/ML IJ SOLN
10.0000 mg | Freq: Once | INTRAMUSCULAR | Status: AC | PRN
  Administered 2011-07-02: 10 mg via INTRAVENOUS
  Filled 2011-07-02: qty 2

## 2011-07-02 MED ORDER — PROPOFOL 10 MG/ML IV EMUL
INTRAVENOUS | Status: DC | PRN
  Administered 2011-07-02: 200 mg via INTRAVENOUS
  Administered 2011-07-02: 50 mg via INTRAVENOUS

## 2011-07-02 MED ORDER — CLINDAMYCIN PHOSPHATE 900 MG/50ML IV SOLN
900.0000 mg | Freq: Three times a day (TID) | INTRAVENOUS | Status: DC
  Filled 2011-07-02 (×2): qty 50

## 2011-07-02 MED ORDER — ROCURONIUM BROMIDE 100 MG/10ML IV SOLN
INTRAVENOUS | Status: DC | PRN
  Administered 2011-07-02: 50 mg via INTRAVENOUS

## 2011-07-02 MED ORDER — ONDANSETRON HCL 4 MG/2ML IJ SOLN
4.0000 mg | Freq: Four times a day (QID) | INTRAMUSCULAR | Status: DC | PRN
  Administered 2011-07-05: 4 mg via INTRAVENOUS
  Filled 2011-07-02: qty 2

## 2011-07-02 MED ORDER — PIPERACILLIN-TAZOBACTAM 3.375 G IVPB
3.3750 g | Freq: Three times a day (TID) | INTRAVENOUS | Status: DC
  Administered 2011-07-02 – 2011-07-05 (×10): 3.375 g via INTRAVENOUS
  Filled 2011-07-02 (×14): qty 50

## 2011-07-02 MED ORDER — FENTANYL CITRATE 0.05 MG/ML IJ SOLN
INTRAMUSCULAR | Status: DC | PRN
  Administered 2011-07-02: 100 ug via INTRAVENOUS
  Administered 2011-07-02 (×3): 50 ug via INTRAVENOUS

## 2011-07-02 MED ORDER — PANTOPRAZOLE SODIUM 40 MG IV SOLR
40.0000 mg | INTRAVENOUS | Status: DC
  Filled 2011-07-02: qty 40

## 2011-07-02 MED ORDER — INSULIN ASPART 100 UNIT/ML ~~LOC~~ SOLN
0.0000 [IU] | SUBCUTANEOUS | Status: DC
  Administered 2011-07-02: 3 [IU] via SUBCUTANEOUS
  Administered 2011-07-02: 2 [IU] via SUBCUTANEOUS

## 2011-07-02 MED ORDER — HEPARIN SODIUM (PORCINE) 5000 UNIT/ML IJ SOLN
5000.0000 [IU] | Freq: Three times a day (TID) | INTRAMUSCULAR | Status: DC
  Administered 2011-07-02: 5000 [IU] via SUBCUTANEOUS
  Filled 2011-07-02 (×4): qty 1

## 2011-07-02 MED ORDER — METOCLOPRAMIDE HCL 5 MG/ML IJ SOLN
INTRAMUSCULAR | Status: AC
  Filled 2011-07-02: qty 2

## 2011-07-02 MED ORDER — NEOSTIGMINE METHYLSULFATE 1 MG/ML IJ SOLN
INTRAMUSCULAR | Status: DC | PRN
  Administered 2011-07-02: 4 mg via INTRAVENOUS

## 2011-07-02 MED ORDER — BUPIVACAINE LIPOSOME 1.3 % IJ SUSP
20.0000 mL | INTRAMUSCULAR | Status: DC
  Filled 2011-07-02: qty 20

## 2011-07-02 MED ORDER — BUPIVACAINE LIPOSOME 1.3 % IJ SUSP
INTRAMUSCULAR | Status: DC | PRN
  Administered 2011-07-02: 20 mL

## 2011-07-02 MED ORDER — ONDANSETRON HCL 4 MG PO TABS
4.0000 mg | ORAL_TABLET | Freq: Four times a day (QID) | ORAL | Status: DC | PRN
  Administered 2011-07-07: 4 mg via ORAL
  Filled 2011-07-02: qty 1

## 2011-07-02 MED ORDER — LIDOCAINE HCL (CARDIAC) 20 MG/ML IV SOLN
INTRAVENOUS | Status: DC | PRN
  Administered 2011-07-02: 100 mg via INTRAVENOUS

## 2011-07-02 MED ORDER — INSULIN ASPART 100 UNIT/ML ~~LOC~~ SOLN: 0.0000 [IU] | Freq: Every day | SUBCUTANEOUS | Status: DC

## 2011-07-02 MED ORDER — METOCLOPRAMIDE HCL 5 MG/ML IJ SOLN
5.0000 mg | Freq: Three times a day (TID) | INTRAMUSCULAR | Status: DC | PRN
  Administered 2011-07-05: 10 mg via INTRAVENOUS
  Filled 2011-07-02: qty 2

## 2011-07-02 MED ORDER — SODIUM CHLORIDE 0.9 % IV SOLN
INTRAVENOUS | Status: DC
  Administered 2011-07-02: 21:00:00 via INTRAVENOUS
  Administered 2011-07-02: 100 mL/h via INTRAVENOUS
  Administered 2011-07-02 – 2011-07-07 (×5): via INTRAVENOUS
  Administered 2011-07-08: 50 mL/h via INTRAVENOUS
  Administered 2011-07-09: 02:00:00 via INTRAVENOUS

## 2011-07-02 SURGICAL SUPPLY — 75 items
BANDAGE ELASTIC 6 VELCRO ST LF (GAUZE/BANDAGES/DRESSINGS) ×6 IMPLANT
BANDAGE ESMARK 6X9 LF (GAUZE/BANDAGES/DRESSINGS) ×2 IMPLANT
BANDAGE GAUZE ELAST BULKY 4 IN (GAUZE/BANDAGES/DRESSINGS) ×3 IMPLANT
BLADE SAW RECIP 87.9 MT (BLADE) ×3 IMPLANT
BLADE SURG 10 STRL SS (BLADE) ×6 IMPLANT
BNDG COHESIVE 4X5 TAN STRL (GAUZE/BANDAGES/DRESSINGS) ×3 IMPLANT
BNDG COHESIVE 6X5 TAN STRL LF (GAUZE/BANDAGES/DRESSINGS) ×6 IMPLANT
BNDG ESMARK 6X9 LF (GAUZE/BANDAGES/DRESSINGS) ×3
BNDG GAUZE STRTCH 6 (GAUZE/BANDAGES/DRESSINGS) IMPLANT
CANISTER WOUND CARE 500ML ATS (WOUND CARE) ×6 IMPLANT
CHLORAPREP W/TINT 26ML (MISCELLANEOUS) ×3 IMPLANT
CLOTH BEACON ORANGE TIMEOUT ST (SAFETY) ×3 IMPLANT
COVER SURGICAL LIGHT HANDLE (MISCELLANEOUS) ×3 IMPLANT
CUFF TOURNIQUET SINGLE 34IN LL (TOURNIQUET CUFF) ×3 IMPLANT
CUFF TOURNIQUET SINGLE 44IN (TOURNIQUET CUFF) IMPLANT
DRAPE EXTREMITY T 121X128X90 (DRAPE) ×3 IMPLANT
DRAPE INCISE IOBAN 66X45 STRL (DRAPES) ×3 IMPLANT
DRAPE PROXIMA HALF (DRAPES) ×9 IMPLANT
DRAPE U-SHAPE 47X51 STRL (DRAPES) ×6 IMPLANT
DRSG MEPITEL 4X7.2 (GAUZE/BANDAGES/DRESSINGS) ×6 IMPLANT
DRSG PAD ABDOMINAL 8X10 ST (GAUZE/BANDAGES/DRESSINGS) IMPLANT
DRSG VAC ATS MED SENSATRAC (GAUZE/BANDAGES/DRESSINGS) ×6 IMPLANT
DRSG VAC ATS SM SENSATRAC (GAUZE/BANDAGES/DRESSINGS) ×3 IMPLANT
ELECT CAUTERY BLADE 6.4 (BLADE) ×6 IMPLANT
ELECT REM PT RETURN 9FT ADLT (ELECTROSURGICAL) ×6
ELECTRODE REM PT RTRN 9FT ADLT (ELECTROSURGICAL) ×4 IMPLANT
EVACUATOR 1/8 PVC DRAIN (DRAIN) IMPLANT
GAUZE XEROFORM 5X9 LF (GAUZE/BANDAGES/DRESSINGS) ×3 IMPLANT
GLOVE BIO SURGEON STRL SZ8 (GLOVE) ×6 IMPLANT
GLOVE BIOGEL PI IND STRL 6.5 (GLOVE) ×4 IMPLANT
GLOVE BIOGEL PI IND STRL 7.0 (GLOVE) ×4 IMPLANT
GLOVE BIOGEL PI IND STRL 8 (GLOVE) ×4 IMPLANT
GLOVE BIOGEL PI INDICATOR 6.5 (GLOVE) ×2
GLOVE BIOGEL PI INDICATOR 7.0 (GLOVE) ×2
GLOVE BIOGEL PI INDICATOR 8 (GLOVE) ×2
GLOVE ECLIPSE 6.5 STRL STRAW (GLOVE) ×3 IMPLANT
GOWN PREVENTION PLUS XLARGE (GOWN DISPOSABLE) ×3 IMPLANT
GOWN STRL NON-REIN LRG LVL3 (GOWN DISPOSABLE) ×9 IMPLANT
IMMOBILIZER KNEE 20 (SOFTGOODS) ×3
IMMOBILIZER KNEE 20 THIGH 36 (SOFTGOODS) ×2 IMPLANT
KIT BASIN OR (CUSTOM PROCEDURE TRAY) ×3 IMPLANT
KIT ROOM TURNOVER OR (KITS) ×3 IMPLANT
MANIFOLD NEPTUNE II (INSTRUMENTS) ×3 IMPLANT
NS IRRIG 1000ML POUR BTL (IV SOLUTION) ×3 IMPLANT
PACK GENERAL/GYN (CUSTOM PROCEDURE TRAY) ×3 IMPLANT
PACK ORTHO EXTREMITY (CUSTOM PROCEDURE TRAY) ×3 IMPLANT
PAD ARMBOARD 7.5X6 YLW CONV (MISCELLANEOUS) ×6 IMPLANT
PAD CAST 4YDX4 CTTN HI CHSV (CAST SUPPLIES) ×2 IMPLANT
PADDING CAST COTTON 4X4 STRL (CAST SUPPLIES) ×1
PADDING CAST COTTON 6X4 STRL (CAST SUPPLIES) ×3 IMPLANT
SET CYSTO W/LG BORE CLAMP LF (SET/KITS/TRAYS/PACK) ×3 IMPLANT
SOAP 2 % CHG 4 OZ (WOUND CARE) ×3 IMPLANT
SPONGE GAUZE 4X4 12PLY (GAUZE/BANDAGES/DRESSINGS) ×3 IMPLANT
SPONGE LAP 18X18 X RAY DECT (DISPOSABLE) ×6 IMPLANT
STAPLER VISISTAT 35W (STAPLE) ×6 IMPLANT
STOCKINETTE IMPERVIOUS LG (DRAPES) ×3 IMPLANT
SUCTION FRAZIER TIP 10 FR DISP (SUCTIONS) ×3 IMPLANT
SUT ETHILON 3 0 FSL (SUTURE) IMPLANT
SUT PDS 2 0 (SUTURE) ×6 IMPLANT
SUT PDS AB 0 CT 36 (SUTURE) ×3 IMPLANT
SUT PROLENE 3 0 PS 2 (SUTURE) ×3 IMPLANT
SUT PROLENE 3 0 SH 48 (SUTURE) IMPLANT
SUT SILK 2 0 (SUTURE) ×1
SUT SILK 2-0 18XBRD TIE 12 (SUTURE) ×2 IMPLANT
SUT VIC AB 2-0 CT1 27 (SUTURE) ×2
SUT VIC AB 2-0 CT1 TAPERPNT 27 (SUTURE) ×4 IMPLANT
SUT VIC AB 3-0 PS2 18 (SUTURE) ×1
SUT VIC AB 3-0 PS2 18XBRD (SUTURE) ×2 IMPLANT
SWAB COLLECTION DEVICE MRSA (MISCELLANEOUS) IMPLANT
TOWEL OR 17X24 6PK STRL BLUE (TOWEL DISPOSABLE) ×3 IMPLANT
TOWEL OR 17X26 10 PK STRL BLUE (TOWEL DISPOSABLE) ×3 IMPLANT
TUBE ANAEROBIC SPECIMEN COL (MISCELLANEOUS) IMPLANT
TUBE CONNECTING 12X1/4 (SUCTIONS) ×9 IMPLANT
WATER STERILE IRR 1000ML POUR (IV SOLUTION) ×3 IMPLANT
YANKAUER SUCT BULB TIP NO VENT (SUCTIONS) ×9 IMPLANT

## 2011-07-02 NOTE — Progress Notes (Signed)
UR Completed.  Vangie Bicker [phone] 07/02/2011

## 2011-07-02 NOTE — ED Notes (Signed)
Dr Ranell Patrick dressed pt foot.  He stated that pt does not want him to do the surgery, but she would like to wait and speak with her surgeon tomorrow.  Dr Ranell Patrick has spoken with Dr Herma Carson and the plan is that if the pt vs begin to take a turn for he worse before 7 am, Dr Ranell Patrick is to be paged and they will take her to emergency surgery.

## 2011-07-02 NOTE — Brief Op Note (Signed)
07/01/2011 - 07/02/2011  3:13 PM  PATIENT:  [name]  46 y.o. female  PRE-OPERATIVE DIAGNOSIS:  gangrene right foot ,left foot ulcer  POST-OPERATIVE DIAGNOSIS:  gangrene right foot ,left foot ulcer  Procedure(s): 1.  Right below knee amputation 2.  Application of wound vac to right BKA stump 3.  Irrigation and excisional debridement of left plantar foot wound including skin and subcut tissue  SURGEON:  [name], MD  ASSISTANT: n/a  ANESTHESIA:   General, regional  EBL:  minimal   TOURNIQUET:   Total Tourniquet Time Documented: Thigh (Right) - 26 minutes R thigh tourniquet  COMPLICATIONS:  None apparent  DISPOSITION:  Extubated, awake and stable to recovery.  DICTATION ID:  [id number]

## 2011-07-02 NOTE — Anesthesia Preprocedure Evaluation (Addendum)
Anesthesia Evaluation  Patient identified by MRN, date of birth, ID band Patient awake    Reviewed: Allergy & Precautions, H&P , NPO status , Patient's Chart, lab work & pertinent test results, reviewed documented beta blocker date and time   Airway Mallampati: II TM Distance: >3 FB Neck ROM: full    Dental  (+) Teeth Intact and Dental Advisory Given   Pulmonary neg pulmonary ROS,  breath sounds clear to auscultation        Cardiovascular hypertension, On Medications + Peripheral Vascular Disease Rhythm:Regular Rate:Normal     Neuro/Psych CVA, Residual Symptoms negative psych ROS   GI/Hepatic negative GI ROS, Neg liver ROS,   Endo/Other  Diabetes mellitus-, Insulin DependentMorbid obesity  Renal/GU negative Renal ROS  negative genitourinary   Musculoskeletal   Abdominal   Peds  Hematology negative hematology ROS (+)   Anesthesia Other Findings See surgeon's H&P   Reproductive/Obstetrics negative OB ROS                          Anesthesia Physical Anesthesia Plan  ASA: III  Anesthesia Plan: General   Post-op Pain Management:    Induction: Intravenous  Airway Management Planned: Oral ETT  Additional Equipment:   Intra-op Plan:   Post-operative Plan: Extubation in OR  Informed Consent: I have reviewed the patients History and Physical, chart, labs and discussed the procedure including the risks, benefits and alternatives for the proposed anesthesia with the patient or authorized representative who has indicated his/her understanding and acceptance.   Dental Advisory Given  Plan Discussed with: CRNA and Surgeon  Anesthesia Plan Comments:         Anesthesia Quick Evaluation

## 2011-07-02 NOTE — Consult Note (Signed)
Reason for Consult:infected right foot Referring Physician: EDP  [name] is an 46 y.o. female.  HPI: 46 yo female with DM s/p transmet amputation of the foot by Dr [name] now admitted with increasing swelling and erythema of the foot.  Patient has been in contact with Dr [name] about the foot but has been putting off doing anything due to some personal issues.  She is now presenting to the ED with fevers, feeling poorly, and with significant increase in foot drainage.  Past Medical History  Diagnosis Date  . Diabetes mellitus   . Hypertension   . Arthritis   . Neuropathy   . Diabetic foot ulcer associated with type 2 diabetes mellitus   . Intracerebral hemorrhage As a teenager     States she had burst blood vessel as teenager, now with resultant minor visual field and hearing deficits    Past Surgical History  Procedure Date  . Cesarean section   . Amputation 11/24/2010    Procedure: AMPUTATION FOOT;  Surgeon: Cheryl Arthurs, MD;  Location: [facility] OR;  Service: Orthopedics;  Laterality: Right;  Right  FOOT TRANS METATARSAL AMPUTATION  . I&d extremity 11/24/2010    Procedure: IRRIGATION AND DEBRIDEMENT EXTREMITY;  Surgeon: Cheryl Arthurs, MD;  Location: [facility] OR;  Service: Orthopedics;  Laterality: Left;  Debriedment of left plantar ulcer and trimming of toenails    Family History  Problem Relation Age of Onset  . Diabetes Mother   . Diabetes Father   . Heart disease Maternal Grandmother     Social History:  reports that she has never smoked. She does not have any smokeless tobacco history on file. She reports that she does not drink alcohol or use illicit drugs.  Allergies: No Known Allergies  Medications: I have reviewed the patient's current medications.  Results for orders placed during the hospital encounter of 07/01/11 (from the past 48 hour(s))  CBC     Status: Abnormal   Collection Time   07/01/11 10:09 PM      Component Value Range Comment   WBC 11.5 (*) 4.0 -  10.5 K/uL    RBC 3.63 (*) 3.87 - 5.11 MIL/uL    Hemoglobin 9.8 (*) 12.0 - 15.0 g/dL    HCT 21.3 (*) 08.6 - 46.0 %    MCV 82.6  78.0 - 100.0 fL    MCH 27.0  26.0 - 34.0 pg    MCHC 32.7  30.0 - 36.0 g/dL    RDW 57.8  46.9 - 62.9 %    Platelets 247  150 - 400 K/uL   COMPREHENSIVE METABOLIC PANEL     Status: Abnormal   Collection Time   07/01/11 10:37 PM      Component Value Range Comment   Sodium 129 (*) 135 - 145 mEq/L    Potassium 4.0  3.5 - 5.1 mEq/L    Chloride 92 (*) 96 - 112 mEq/L    CO2 19  19 - 32 mEq/L    Glucose, Bld 227 (*) 70 - 99 mg/dL    BUN 17  6 - 23 mg/dL    Creatinine, Ser 5.28 (*) 0.50 - 1.10 mg/dL    Calcium 8.8  8.4 - 41.3 mg/dL    Total Protein 8.8 (*) 6.0 - 8.3 g/dL    Albumin 3.0 (*) 3.5 - 5.2 g/dL    AST 30  0 - 37 U/L    ALT 16  0 - 35 U/L    Alkaline Phosphatase 96  39 - 117 U/L    Total Bilirubin 0.3  0.3 - 1.2 mg/dL    GFR calc non Af Amer 47 (*) >90 mL/min    GFR calc Af Amer 54 (*) >90 mL/min   LACTIC ACID, PLASMA     Status: Abnormal   Collection Time   07/01/11 10:37 PM      Component Value Range Comment   Lactic Acid, Venous 3.1 (*) 0.5 - 2.2 mmol/L   POCT I-STAT, CHEM 8     Status: Abnormal   Collection Time   07/01/11 11:54 PM      Component Value Range Comment   Sodium 133 (*) 135 - 145 mEq/L    Potassium 3.7  3.5 - 5.1 mEq/L    Chloride 101  96 - 112 mEq/L    BUN 16  6 - 23 mg/dL    Creatinine, Ser 1.61 (*) 0.50 - 1.10 mg/dL    Glucose, Bld 096 (*) 70 - 99 mg/dL    Calcium, Ion 0.45 (*) 1.12 - 1.32 mmol/L    TCO2 17  0 - 100 mmol/L    Hemoglobin 9.2 (*) 12.0 - 15.0 g/dL    HCT 40.9 (*) 81.1 - 46.0 %   APTT     Status: Normal   Collection Time   07/02/11 12:54 AM      Component Value Range Comment   aPTT 37  24 - 37 seconds   PROTIME-INR     Status: Abnormal   Collection Time   07/02/11 12:54 AM      Component Value Range Comment   Prothrombin Time 18.9 (*) 11.6 - 15.2 seconds    INR 1.55 (*) 0.00 - 1.49     Dg Chest Port 1  View  07/01/2011  *RADIOLOGY REPORT*  Clinical Data: 46 year old female with fever chest pain increased heart rate.  PORTABLE CHEST - 1 VIEW  Comparison: 10/24/2010.  Findings: Portable semi upright AP view 2327 hours.  Low lung volumes similar the prior study.  Cardiomegaly is difficult to exclude. Other mediastinal contours are within normal limits. Visualized tracheal air column is within normal limits.  No pneumothorax or large pleural effusion.  Pulmonary vascular congestion without overt edema.  Patchy basilar opacity, slightly greater on the left.  IMPRESSION: Low lung volumes with patchy bibasilar opacity which could reflect atelectasis or pneumonia.  PA and lateral radiographs recommended when possible.  Original Report Authenticated By: Harley Hallmark, M.D.   Dg Foot 2 Views Right  07/02/2011  *RADIOLOGY REPORT*  Clinical Data: Right foot wound infection with fever and swelling. History of diabetes.  RIGHT FOOT - 2 VIEW  Comparison: 11/22/2010  Findings: Interval trans tarsal amputation of the right foot. Soft tissue defects at the stump of the amputation.  This could represent irregularities associated with the wound or new ulceration.  There is amorphous subcutaneous gas in the soft tissues along the medial aspect of the midfoot at the level of the navicular.  This is consistent with soft tissue infection with gas forming organism.  Visualized bones appear grossly intact.  There is no obvious bone sclerosis, periosteal reaction, or bone destruction to suggest osteomyelitis.  If clinically indicated, MRI would be more sensitive for detection of bone changes.  IMPRESSION: Interval trans tarsal amputation of the right foot with the residual or recurrent defect over the wound.  There is soft tissue gas medial to the navicular consistent with infection with gas forming organism.  No obvious changes of osteomyelitis.  Original Report Authenticated By: Marlon Pel, M.D.    ROS Blood pressure  123/47, pulse 102, temperature 100.3 F (37.9 C), temperature source Oral, resp. rate 23, last menstrual period 06/17/2011, SpO2 95.00%. Physical Exam  Patient is in moderate distress with right foot swollen and erythematous. 2+ edema, foul-swelling drainage  Assessment/Plan: Right foot infection after midfoot amputation.  Patient needs urgent/ emergent BKA.  I discussed this with the patient who does not want ot have surgery right now, but wishes to wait until Dr [name] can evaluate and treat her.  She understands that this can threaten her leg and he life.  I discussed this with the critical care team who understands the patient's wishes.  Their feeling is that if her vitals decline (increased HR or decreased BP) then she must go to the OR emergently.  I explained this to the patient who does agree to this plan. Have alerted the nurse who will call CCM and me and keep [place] posted.  Will alert Dr [name], her ortho surgeon to the situation in the AM.  Telesia Ates,STEVEN R 07/02/2011, 1:45 AM

## 2011-07-02 NOTE — Progress Notes (Signed)
Pt profile: 46 yo diabetic with diabetic foot ulcer and right foot amputation on 11/24/2010. Presents to [facility] ED with pain, swelling, purulent malodorous discharge from the stump. Hypotensive, tachycardia and febrile upon presentation. Had one episode of non bloody, non bilious emesis while in ED. BP improved with fluid resuscitation  Active problems: Sepsis RLE gangrene - s/p BKA 6/28 [name]) DM/Htn  Lines, Tubes, etc: None  Microbiology: MRSA PCR 6/28 >> NEG Blood X 2 6/28 >>   Antibiotics:  Vanc 6/28 >> Zosyn 6/28 >>    Studies/Events: 6/28 R BKA, debridement L foot ulcer  Consults:  Ortho [name])   Best Practice: DVT: SCDs SUP: N/I Nutrition: CHO modified Glycemic control:  Sedation/analgesia: dilaudid.oxycodone    Subj: No distress, no new complaints  Obj: Filed Vitals:   07/02/11 1800  BP: 135/61  Pulse: 89  Temp:   Resp: 26    Gen: NAD HEENT: WNL Neck: No JVD Chest: clear Cardiac: RRR s M ZOX:WRUE, soft Ext: No edema, RLE surgical dressing  BMET    Component Value Date/Time   NA 135 07/02/2011 0430   K 3.4* 07/02/2011 0430   CL 100 07/02/2011 0430   CO2 21 07/02/2011 0430   GLUCOSE 185* 07/02/2011 0430   BUN 15 07/02/2011 0430   CREATININE 1.16* 07/02/2011 0430   CALCIUM 8.1* 07/02/2011 0430   GFRNONAA 56* 07/02/2011 0430   GFRAA 65* 07/02/2011 0430    CBC    Component Value Date/Time   WBC 5.4 07/02/2011 1602   RBC 3.78* 07/02/2011 1602   HGB 10.2* 07/02/2011 1602   HCT 31.5* 07/02/2011 1602   PLT 155 07/02/2011 1602   MCV 83.3 07/02/2011 1602   MCH 27.0 07/02/2011 1602   MCHC 32.4 07/02/2011 1602   RDW 15.4 07/02/2011 1602   LYMPHSABS 0.9 12/30/2006 2145   MONOABS 0.4 12/30/2006 2145   EOSABS 0.1 12/30/2006 2145   BASOSABS 0.0 12/30/2006 2145    CXR: no new CXR   IMPRESSION: Sepsis due to gangrenous RLE  PLAN/RECS:  Cont abx Post op mgmt per [facility] in ICU over night. If no complications transfer to regular bed  Has been  cared for by [facility] in recent past   Billy Fischer, MD ; [facility] [phone].  After 5:30 PM or weekends, call [phone]

## 2011-07-02 NOTE — Preoperative (Signed)
Beta Blockers   Reason not to administer Beta Blockers:Not Applicable 

## 2011-07-02 NOTE — Progress Notes (Signed)
DECLARATION OF EMERGENCY SURGERY  Patient involved in MCA early this AM. Patient with LOC and decreased GCS in field.  Responsive initially in the ED, however after pain medication she has gone unresponsive. She has an open fracture of the right great toe requiring operative treatment in an urgent manner.  Unable to obtain informed consent at this time.  Family not available to sign for her.  Dr Ranell Patrick and Dr Lindie Spruce agree with this treatment plan.  Will also treat whole body abrasions at this time.  [name]. Ranell Patrick, MD [facility]

## 2011-07-02 NOTE — Progress Notes (Signed)
07/02/2011  Dr Lequita Halt notified of no drainage from  wound vac  with no seal leakage and prior drainage amount of 300 ml over 4 hours. Patient  C/o throbbing sometimes sharp 7/10 pain in RLE. No new orders received. Wound vac seal to remain at 125 mm Hg. Patient informed of MD response. Will continue to monitor and update as needed.   [name]

## 2011-07-02 NOTE — Transfer of Care (Signed)
Immediate Anesthesia Transfer of Care Note  Patient: [name]  Procedure(s) Performed: Procedure(s) (LRB): AMPUTATION BELOW KNEE (Right) APPLICATION OF WOUND VAC (Right) IRRIGATION AND DEBRIDEMENT EXTREMITY (Left)  Patient Location: PACU  Anesthesia Type: General  Level of Consciousness: awake, sedated and pateint uncooperative  Airway & Oxygen Therapy: Patient Spontanous Breathing and Patient connected to nasal cannula oxygen  Post-op Assessment: Report given to PACU RN, Post -op Vital signs reviewed and stable, Patient moving all extremities and somewhat hard to work with, insists on sitting up, fidgety.  Post vital signs: Reviewed and stable  Complications: No apparent anesthesia complications

## 2011-07-02 NOTE — H&P (Signed)
Name: [name] MRN: [id number] DOB: Aug 15, 1965    LOS: 1  Referring Provider:  EDP Reason for Referral:  Sepsis  PULMONARY / CRITICAL CARE MEDICINE  HPI:  46 yo diabetic with diabetic foot ulcer and right foot amputation on 11/24/2010.  Presents to [facility] ED with pain, swelling, purulent malodorous discharge from the stump.  Hypotensive, tachycardia and febrile upon presentation. Had one episode of non bloody, non bilious emesis while in ED.  BP improved with fluid resuscitation.   Past Medical History  Diagnosis Date  . Diabetes mellitus   . Hypertension   . Arthritis   . Neuropathy   . Diabetic foot ulcer associated with type 2 diabetes mellitus   . Intracerebral hemorrhage As a teenager     States she had burst blood vessel as teenager, now with resultant minor visual field and hearing deficits   Past Surgical History  Procedure Date  . Cesarean section   . Amputation 11/24/2010    Procedure: AMPUTATION FOOT;  Surgeon: [name], MD;  Location: [facility] OR;  Service: Orthopedics;  Laterality: Right;  Right  FOOT TRANS METATARSAL AMPUTATION  . I&d extremity 11/24/2010    Procedure: IRRIGATION AND DEBRIDEMENT EXTREMITY;  Surgeon: [name], MD;  Location: [facility] OR;  Service: Orthopedics;  Laterality: Left;  Debriedment of left plantar ulcer and trimming of toenails   Prior to Admission medications   Medication Sig Start Date End Date Taking? Authorizing Provider  aspirin EC 81 MG tablet Take 81 mg by mouth daily.   Yes Historical Provider, MD  ferrous sulfate 325 (65 FE) MG tablet Take 325 mg by mouth 2 (two) times daily with a meal.   Yes Historical Provider, MD  insulin aspart (NOVOLOG) 100 UNIT/ML injection Inject 0-15 Units into the skin 3 (three) times daily before meals. Inject 0-15 Units into the skin 3 (three) times daily with meals.   CBG 70 - 120: 0 units  CBG 121 - 150: 2 units CBG 151 - 200: 3 units  CBG 201 - 250: 5 units  CBG 251 - 300: 8 units CBG 301 - 350: 11  units CBG 351 - 400: 15 units  If CBG>400 CALL MD 11/29/10  Yes Shanker Levora Dredge, MD  insulin glargine (LANTUS) 100 UNIT/ML injection Inject 15 Units into the skin at bedtime.   Yes Historical Provider, MD  naproxen sodium (ANAPROX) 220 MG tablet Take 220 mg by mouth 2 (two) times daily with a meal.   Yes Historical Provider, MD   Allergies No Known Allergies  Family History Family History  Problem Relation Age of Onset  . Diabetes Mother   . Diabetes Father   . Heart disease Maternal Grandmother    Social History  reports that she has never smoked. She does not have any smokeless tobacco history on file. She reports that she does not drink alcohol or use illicit drugs.  Review Of Systems:  Negative except as noted in HPI  Brief patient description:  46 yo diabetic with diabetic foot ulcer and right foot amputation on 11/24/2010.  Presents to [facility] ED with pain, swelling, purulent malodorous discharge from the stump.  Hypotensive, tachycardia and febrile upon presentation. Had one episode of non bloody, non bilious emesis while in ED.  BP improved with fluid resuscitation.   Events Since Admission: 6/28  Admitted with sepsis secondary to infected R foot stump.  Current Status:  Vital Signs: Temp:  [103.2 F (39.6 C)] 103.2 F (39.6 C) (06/27 2158) Pulse  Rate:  [123-124] 123  (06/27 2237) Resp:  [20-23] 23  (06/27 2237) BP: (132-143)/(60-62) 143/62 mmHg (06/27 2237) SpO2:  [99 %-100 %] 99 % (06/27 2237)  Physical Examination: General:  No respiratory distress, appears acutely ill Neuro:  Awake, alert, cooperative HEENT:  PERRL, dry membranes Neck:  Soft, no JVD Cardiovascular:  Tachycardic, regular Lungs:  CTAB Abdomen:  Soft, non tender, bowel sounds present Musculoskeletal:  R foot stamp ulcer draining malodorous purulent discharge, R lower extremity is edematous, erythematous and tender to touch   Skin:  Intact, except as above  Active Problems:  Gas gangrene   Sepsis  Acute renal failure  Diabetic foot ulcer associated with type 2 diabetes mellitus  Diabetes mellitus  ASSESSMENT AND PLAN  PULMONARY No results found for this basename: PHART:5,PCO2:5,PCO2ART:5,PO2ART:5,HCO3:5,O2SAT:5 in the last 168 hours Ventilator Settings:  NA   CXR:  6/27 >>> Low lung volumes with patchy bibasilar opacity which could reflect atelectasis or pneumonia.  ETT:  NA  A:  Possible pneumonia vs. Atelectasis. P:   Supplemental oxygen to keep SpO2 > 92% Follow up CXR in AM ABx per ID section  CARDIOVASCULAR  Lab 07/01/11 2237  TROPONINI --  LATICACIDVEN 3.1*  PROBNP --   ECG:  6/28 >>> Sinus tachycardia, nonspecific ST-T changes. Lines: NA  A: Sepsis, currently responsive to fluid resuscitation. P:  NS boluses PRN May need CVL / vasopressors Trend lactate  RENAL  Lab 07/01/11 2354 07/01/11 2237  NA 133* 129*  K 3.7 4.0  CL 101 92*  CO2 -- 19  BUN 16 17  CREATININE 1.60* 1.36*  CALCIUM -- 8.8  MG -- --  PHOS -- --   Intake/Output    None    Foley:  NA  A:  Acute renal failure.  Hyponatremia. P:   IVF as above Trend BMP  GASTROINTESTINAL  Lab 07/01/11 2237  AST 30  ALT 16  ALKPHOS 96  BILITOT 0.3  PROT 8.8*  ALBUMIN 3.0*   A:  No active issues. P:   NPO for possible surgery  HEMATOLOGIC  Lab 07/01/11 2354 07/01/11 2209  HGB 9.2* 9.8*  HCT 27.0* 30.0*  PLT -- 247  INR -- --  APTT -- --   A:  Anemia, stable. P:  APPT / INR Type and screen  INFECTIOUS  Lab 07/01/11 2209  WBC 11.5*  PROCALCITON --   R foot XR:  6/27 >>> Interval trans tarsal amputation of the right foot with the residual or recurrent defect over the wound. There is soft tissue gas medial to the navicular consistent with infection with gas forming organism. No obvious changes of osteomyelitis.  Cultures: 6/27 Blood >>> Antibiotics: Zosyn 6/27 >>> Vancomycin 6/27 >>> Clindamycin 6/28 >>>  A:  Gas gangrene R foot.  Possible  pneumonia. P:   Antibiotics / cultures as above PCT Ortho consulted by EDP  ENDOCRINE No results found for this basename: GLUCAP:5 in the last 168 hours A:  IDDM. P:   SSI/CBG Hold Lantus as NPO  NEUROLOGIC  A:  No active issues. P:   No interventions required  BEST PRACTICE / DISPOSITION Level of Care:  ICU Primary Service:  PCCM Consultants:  Ortho Code Status:  Full Diet:  NPO DVT Px:  Heparin GI Px:  Protonix Skin Integrity:  As above Social / Family:  Not available  The patient is critically ill with multiple organ systems failure and requires high complexity decision making for assessment and support, frequent evaluation and titration  of therapies, application of advanced monitoring technologies and extensive interpretation of multiple databases. Critical Care Time devoted to patient care services described in this note is 35 minutes.  [name], M.D. Pulmonary and Critical Care Medicine [facility] Pager: [phone]  07/02/2011, 12:38 AM

## 2011-07-02 NOTE — Progress Notes (Addendum)
ANTIBIOTIC CONSULT NOTE - INITIAL  Pharmacy Consult for Vancomycin and Zosyn  Indication: rule out sepsis  No Known Allergies  Patient Measurements:   Body Weight: 90 kg (11/2010)  Vital Signs: Temp: 103.2 F (39.6 C) (06/27 2158) Temp src: Oral (06/27 2158) BP: 143/62 mmHg (06/27 2237) Pulse Rate: 123  (06/27 2237) Intake/Output from previous day:   Intake/Output from this shift:    Labs:  Basename 07/01/11 2354 07/01/11 2237 07/01/11 2209  WBC -- -- 11.5*  HGB 9.2* -- 9.8*  PLT -- -- 247  LABCREA -- -- --  CREATININE 1.60* 1.36* --   The CrCl is unknown because both a height and weight (above a minimum accepted value) are required for this calculation. No results found for this basename: VANCOTROUGH:2,VANCOPEAK:2,VANCORANDOM:2,GENTTROUGH:2,GENTPEAK:2,GENTRANDOM:2,TOBRATROUGH:2,TOBRAPEAK:2,TOBRARND:2,AMIKACINPEAK:2,AMIKACINTROU:2,AMIKACIN:2, in the last 72 hours   Microbiology: No results found for this or any previous visit (from the past 720 hour(s)).  Medical History: Past Medical History  Diagnosis Date  . Diabetes mellitus   . Hypertension   . Arthritis   . Neuropathy   . Diabetic foot ulcer associated with type 2 diabetes mellitus   . Intracerebral hemorrhage As a teenager     States she had burst blood vessel as teenager, now with resultant minor visual field and hearing deficits    Medications:  ASA  Iron  Novolog  Lantus  Naproxen  Assessment: 46 yo female with foot wound, sepsis for empiric antibiotics   Goal of Therapy:  Vancomycin trough level 15-20 mcg/ml  Plan:  Zosyn 3.375 g IV q8h Vancomycin 1 g IV q12h  Eddie Candle 07/02/2011,12:58 AM

## 2011-07-02 NOTE — ED Provider Notes (Signed)
History     CSN: [id number]  Arrival date & time 07/01/11  2151   First MD Initiated Contact with Patient 07/01/11 2308      Chief Complaint  Patient presents with  . Wound Infection    (Consider location/radiation/quality/duration/timing/severity/associated sxs/prior treatment) Patient is a 46 y.o. female presenting with fever. The history is provided by the patient.  Fever Primary symptoms of the febrile illness include fever, nausea and vomiting. Primary symptoms do not include diarrhea. The current episode started 3 to 5 days ago. This is a new problem. The problem has not changed since onset. The vomiting began more than 2 days ago. Vomiting occurs 2 to 5 times per day. The emesis contains stomach contents.  Associated with: diabetic wound infection of the right foot. Risk factors for febrile illness include diabetes mellitus.Primary symptoms comment: wound infection  Noted the wound infection of the right foot over a week ago and has had a foul smelling odor.  Fevers, shaking chills.  N/v.    Past Medical History  Diagnosis Date  . Diabetes mellitus   . Hypertension   . Arthritis   . Neuropathy   . Diabetic foot ulcer associated with type 2 diabetes mellitus   . Intracerebral hemorrhage As a teenager     States she had burst blood vessel as teenager, now with resultant minor visual field and hearing deficits    Past Surgical History  Procedure Date  . Cesarean section   . Amputation 11/24/2010    Procedure: AMPUTATION FOOT;  Surgeon: [name], MD;  Location: [facility] OR;  Service: Orthopedics;  Laterality: Right;  Right  FOOT TRANS METATARSAL AMPUTATION  . I&d extremity 11/24/2010    Procedure: IRRIGATION AND DEBRIDEMENT EXTREMITY;  Surgeon: [name], MD;  Location: [facility] OR;  Service: Orthopedics;  Laterality: Left;  Debriedment of left plantar ulcer and trimming of toenails    Family History  Problem Relation Age of Onset  . Diabetes Mother   . Diabetes Father   .  Heart disease Maternal Grandmother     History  Substance Use Topics  . Smoking status: Never Smoker   . Smokeless tobacco: Not on file  . Alcohol Use: No    OB History    Grav Para Term Preterm Abortions TAB SAB Ect Mult Living                  Review of Systems  Constitutional: Positive for fever and chills.  Gastrointestinal: Positive for nausea and vomiting. Negative for diarrhea.  Skin: Positive for color change and wound.  All other systems reviewed and are negative.    Allergies  Review of patient's allergies indicates no known allergies.  Home Medications   Current Outpatient Rx  Name Route Sig Dispense Refill  . ASPIRIN EC 81 MG PO TBEC Oral Take 81 mg by mouth daily.    Marland Kitchen FERROUS SULFATE 325 (65 FE) MG PO TABS Oral Take 325 mg by mouth 2 (two) times daily with a meal.    . INSULIN ASPART 100 UNIT/ML [place] SOLN Subcutaneous Inject 0-15 Units into the skin 3 (three) times daily before meals. Inject 0-15 Units into the skin 3 (three) times daily with meals.   CBG 70 - 120: 0 units  CBG 121 - 150: 2 units CBG 151 - 200: 3 units  CBG 201 - 250: 5 units  CBG 251 - 300: 8 units CBG 301 - 350: 11 units CBG 351 - 400: 15 units  If  CBG>400 CALL MD    . INSULIN GLARGINE 100 UNIT/ML [place] SOLN Subcutaneous Inject 15 Units into the skin at bedtime.    Marland Kitchen NAPROXEN SODIUM 220 MG PO TABS Oral Take 220 mg by mouth 2 (two) times daily with a meal.      BP 143/62  Pulse 123  Temp 103.2 F (39.6 C) (Oral)  Resp 23  SpO2 99%  LMP 06/17/2011  Physical Exam  Constitutional: She is oriented to person, place, and time. She appears well-developed and well-nourished. No distress.  HENT:  Head: Normocephalic and atraumatic.  Mouth/Throat: Oropharynx is clear and moist.  Eyes: Conjunctivae are normal. Pupils are equal, round, and reactive to light.  Neck: Normal range of motion. Neck supple.  Cardiovascular: Normal rate and regular rhythm.   Pulmonary/Chest: Effort normal and  breath sounds normal.  Abdominal: Soft. Bowel sounds are normal.  Neurological: She is alert and oriented to person, place, and time.  Skin: Skin is warm and dry.       Wound at transmetarsal site of the of the right foot.  Skin pain with drainage.    Psychiatric: She has a normal mood and affect.    ED Course  Procedures (including critical care time)  Labs Reviewed  CBC - Abnormal; Notable for the following:    WBC 11.5 (*)     RBC 3.63 (*)     Hemoglobin 9.8 (*)     HCT 30.0 (*)     All other components within normal limits  COMPREHENSIVE METABOLIC PANEL - Abnormal; Notable for the following:    Sodium 129 (*)     Chloride 92 (*)     Glucose, Bld 227 (*)     Creatinine, Ser 1.36 (*)     Total Protein 8.8 (*)     Albumin 3.0 (*)     GFR calc non Af Amer 47 (*)     GFR calc Af Amer 54 (*)     All other components within normal limits  LACTIC ACID, PLASMA - Abnormal; Notable for the following:    Lactic Acid, Venous 3.1 (*)     All other components within normal limits  POCT I-STAT, CHEM 8 - Abnormal; Notable for the following:    Sodium 133 (*)     Creatinine, Ser 1.60 (*)     Glucose, Bld 203 (*)     Calcium, Ion 1.01 (*)     Hemoglobin 9.2 (*)     HCT 27.0 (*)     All other components within normal limits  CULTURE, BLOOD (ROUTINE X 2)  CULTURE, BLOOD (ROUTINE X 2)  CBC  BASIC METABOLIC PANEL  PHOSPHORUS  MAGNESIUM   Dg Chest Port 1 View  07/01/2011  *RADIOLOGY REPORT*  Clinical Data: 46 year old female with fever chest pain increased heart rate.  PORTABLE CHEST - 1 VIEW  Comparison: 10/24/2010.  Findings: Portable semi upright AP view 2327 hours.  Low lung volumes similar the prior study.  Cardiomegaly is difficult to exclude. Other mediastinal contours are within normal limits. Visualized tracheal air column is within normal limits.  No pneumothorax or large pleural effusion.  Pulmonary vascular congestion without overt edema.  Patchy basilar opacity, slightly  greater on the left.  IMPRESSION: Low lung volumes with patchy bibasilar opacity which could reflect atelectasis or pneumonia.  PA and lateral radiographs recommended when possible.  Original Report Authenticated By: Harley Hallmark, M.D.   Dg Foot 2 Views Right  07/02/2011  *RADIOLOGY REPORT*  Clinical Data: Right foot wound infection with fever and swelling. History of diabetes.  RIGHT FOOT - 2 VIEW  Comparison: 11/22/2010  Findings: Interval trans tarsal amputation of the right foot. Soft tissue defects at the stump of the amputation.  This could represent irregularities associated with the wound or new ulceration.  There is amorphous subcutaneous gas in the soft tissues along the medial aspect of the midfoot at the level of the navicular.  This is consistent with soft tissue infection with gas forming organism.  Visualized bones appear grossly intact.  There is no obvious bone sclerosis, periosteal reaction, or bone destruction to suggest osteomyelitis.  If clinically indicated, MRI would be more sensitive for detection of bone changes.  IMPRESSION: Interval trans tarsal amputation of the right foot with the residual or recurrent defect over the wound.  There is soft tissue gas medial to the navicular consistent with infection with gas forming organism.  No obvious changes of osteomyelitis.  Original Report Authenticated By: Marlon Pel, M.D.     1. Sepsis   2. Wound infection       MDM  CRITICAL CARE Performed by: Jasmine Awe   Total critical care time: 60 minutes  Critical care time was exclusive of separately billable procedures and treating other patients.  Critical care was necessary to treat or prevent imminent or life-threatening deterioration.  Critical care was time spent personally by me on the following activities: development of treatment plan with patient and/or surrogate as well as nursing, discussions with consultants, evaluation of patient's response to  treatment, examination of patient, obtaining history from patient or surrogate, ordering and performing treatments and interventions, ordering and review of laboratory studies, ordering and review of radiographic studies, pulse oximetry and re-evaluation of patient's condition.         Jasmine Awe, MD 07/02/11 [phone]

## 2011-07-02 NOTE — Consult Note (Signed)
Reason for Consult:right foot gangrene Referring Physician: Donnajean Chesnut is an 46 y.o. female.  HPI: 47 y/o female with h/o R foot chronic diabetic ulcer and cellulitis admitted last night septic from right foot wound.  Pt declined surgery by Dr. Ranell Patrick and requested that I treat her.  She is well known to me for treatment of these chronic conditions and was most recently seen about 10 days ago.  Multiple attempts were made at that time to admit her to the hospital for treatment of her right foot infection.  She did not comply with these attempts.  Please refer to office documentation for further details.  She c/o nausea, vomiting, right foot pain, fever and chills.  Says the foot has become progressive worse smelling.  Past Medical History  Diagnosis Date  . Diabetes mellitus   . Hypertension   . Arthritis   . Neuropathy   . Diabetic foot ulcer associated with type 2 diabetes mellitus   . Intracerebral hemorrhage As a teenager     States she had burst blood vessel as teenager, now with resultant minor visual field and hearing deficits    Past Surgical History  Procedure Date  . Cesarean section   . Amputation 11/24/2010    Procedure: AMPUTATION FOOT;  Surgeon: [name], MD;  Location: [facility] OR;  Service: Orthopedics;  Laterality: Right;  Right  FOOT TRANS METATARSAL AMPUTATION  . I&d extremity 11/24/2010    Procedure: IRRIGATION AND DEBRIDEMENT EXTREMITY;  Surgeon: [name], MD;  Location: [facility] OR;  Service: Orthopedics;  Laterality: Left;  Debriedment of left plantar ulcer and trimming of toenails    Family History  Problem Relation Age of Onset  . Diabetes Mother   . Diabetes Father   . Heart disease Maternal Grandmother     Social History:  reports that she has never smoked. She does not have any smokeless tobacco history on file. She reports that she does not drink alcohol or use illicit drugs.  Allergies: No Known Allergies  Medications: I have reviewed the  patient's current medications.  Results for orders placed during the hospital encounter of 07/01/11 (from the past 48 hour(s))  CBC     Status: Abnormal   Collection Time   07/01/11 10:09 PM      Component Value Range Comment   WBC 11.5 (*) 4.0 - 10.5 K/uL    RBC 3.63 (*) 3.87 - 5.11 MIL/uL    Hemoglobin 9.8 (*) 12.0 - 15.0 g/dL    HCT 16.1 (*) 09.6 - 46.0 %    MCV 82.6  78.0 - 100.0 fL    MCH 27.0  26.0 - 34.0 pg    MCHC 32.7  30.0 - 36.0 g/dL    RDW 04.5  40.9 - 81.1 %    Platelets 247  150 - 400 K/uL   COMPREHENSIVE METABOLIC PANEL     Status: Abnormal   Collection Time   07/01/11 10:37 PM      Component Value Range Comment   Sodium 129 (*) 135 - 145 mEq/L    Potassium 4.0  3.5 - 5.1 mEq/L    Chloride 92 (*) 96 - 112 mEq/L    CO2 19  19 - 32 mEq/L    Glucose, Bld 227 (*) 70 - 99 mg/dL    BUN 17  6 - 23 mg/dL    Creatinine, Ser 9.14 (*) 0.50 - 1.10 mg/dL    Calcium 8.8  8.4 - 78.2 mg/dL  Total Protein 8.8 (*) 6.0 - 8.3 g/dL    Albumin 3.0 (*) 3.5 - 5.2 g/dL    AST 30  0 - 37 U/L    ALT 16  0 - 35 U/L    Alkaline Phosphatase 96  39 - 117 U/L    Total Bilirubin 0.3  0.3 - 1.2 mg/dL    GFR calc non Af Amer 47 (*) >90 mL/min    GFR calc Af Amer 54 (*) >90 mL/min   LACTIC ACID, PLASMA     Status: Abnormal   Collection Time   07/01/11 10:37 PM      Component Value Range Comment   Lactic Acid, Venous 3.1 (*) 0.5 - 2.2 mmol/L   POCT I-STAT, CHEM 8     Status: Abnormal   Collection Time   07/01/11 11:54 PM      Component Value Range Comment   Sodium 133 (*) 135 - 145 mEq/L    Potassium 3.7  3.5 - 5.1 mEq/L    Chloride 101  96 - 112 mEq/L    BUN 16  6 - 23 mg/dL    Creatinine, Ser 4.09 (*) 0.50 - 1.10 mg/dL    Glucose, Bld 811 (*) 70 - 99 mg/dL    Calcium, Ion 9.14 (*) 1.12 - 1.32 mmol/L    TCO2 17  0 - 100 mmol/L    Hemoglobin 9.2 (*) 12.0 - 15.0 g/dL    HCT 78.2 (*) 95.6 - 46.0 %   APTT     Status: Normal   Collection Time   07/02/11 12:54 AM      Component Value  Range Comment   aPTT 37  24 - 37 seconds   PROTIME-INR     Status: Abnormal   Collection Time   07/02/11 12:54 AM      Component Value Range Comment   Prothrombin Time 18.9 (*) 11.6 - 15.2 seconds    INR 1.55 (*) 0.00 - 1.49   LACTIC ACID, PLASMA     Status: Normal   Collection Time   07/02/11  1:01 AM      Component Value Range Comment   Lactic Acid, Venous 1.8  0.5 - 2.2 mmol/L   PROCALCITONIN     Status: Normal   Collection Time   07/02/11  1:04 AM      Component Value Range Comment   Procalcitonin 0.82     TYPE AND SCREEN     Status: Normal   Collection Time   07/02/11  1:15 AM      Component Value Range Comment   ABO/RH(D) B POS      Antibody Screen NEG      Sample Expiration 07/05/2011     GLUCOSE, CAPILLARY     Status: Abnormal   Collection Time   07/02/11  2:32 AM      Component Value Range Comment   Glucose-Capillary 185 (*) 70 - 99 mg/dL    Comment 1 Documented in Chart      Comment 2 Notify RN     MRSA PCR SCREENING     Status: Normal   Collection Time   07/02/11  3:00 AM      Component Value Range Comment   MRSA by PCR NEGATIVE  NEGATIVE   CBC     Status: Abnormal   Collection Time   07/02/11  4:30 AM      Component Value Range Comment   WBC 7.7  4.0 - 10.5 K/uL  RBC 3.17 (*) 3.87 - 5.11 MIL/uL    Hemoglobin 8.5 (*) 12.0 - 15.0 g/dL    HCT 08.6 (*) 57.8 - 46.0 %    MCV 82.6  78.0 - 100.0 fL    MCH 26.8  26.0 - 34.0 pg    MCHC 32.4  30.0 - 36.0 g/dL    RDW 46.9  62.9 - 52.8 %    Platelets 200  150 - 400 K/uL   BASIC METABOLIC PANEL     Status: Abnormal   Collection Time   07/02/11  4:30 AM      Component Value Range Comment   Sodium 135  135 - 145 mEq/L    Potassium 3.4 (*) 3.5 - 5.1 mEq/L    Chloride 100  96 - 112 mEq/L    CO2 21  19 - 32 mEq/L    Glucose, Bld 185 (*) 70 - 99 mg/dL    BUN 15  6 - 23 mg/dL    Creatinine, Ser 4.13 (*) 0.50 - 1.10 mg/dL    Calcium 8.1 (*) 8.4 - 10.5 mg/dL    GFR calc non Af Amer 56 (*) >90 mL/min    GFR calc Af Amer  65 (*) >90 mL/min   PHOSPHORUS     Status: Normal   Collection Time   07/02/11  4:30 AM      Component Value Range Comment   Phosphorus 3.0  2.3 - 4.6 mg/dL   MAGNESIUM     Status: Normal   Collection Time   07/02/11  4:30 AM      Component Value Range Comment   Magnesium 1.7  1.5 - 2.5 mg/dL   LACTIC ACID, PLASMA     Status: Normal   Collection Time   07/02/11  4:52 AM      Component Value Range Comment   Lactic Acid, Venous 1.1  0.5 - 2.2 mmol/L   GLUCOSE, CAPILLARY     Status: Abnormal   Collection Time   07/02/11  7:07 AM      Component Value Range Comment   Glucose-Capillary 125 (*) 70 - 99 mg/dL    Comment 1 Documented in Chart      Comment 2 Notify RN     GLUCOSE, CAPILLARY     Status: Abnormal   Collection Time   07/02/11  8:18 AM      Component Value Range Comment   Glucose-Capillary 122 (*) 70 - 99 mg/dL   LACTIC ACID, PLASMA     Status: Normal   Collection Time   07/02/11  8:32 AM      Component Value Range Comment   Lactic Acid, Venous 1.0  0.5 - 2.2 mmol/L     Dg Chest Port 1 View  07/01/2011  *RADIOLOGY REPORT*  Clinical Data: 46 year old female with fever chest pain increased heart rate.  PORTABLE CHEST - 1 VIEW  Comparison: 10/24/2010.  Findings: Portable semi upright AP view 2327 hours.  Low lung volumes similar the prior study.  Cardiomegaly is difficult to exclude. Other mediastinal contours are within normal limits. Visualized tracheal air column is within normal limits.  No pneumothorax or large pleural effusion.  Pulmonary vascular congestion without overt edema.  Patchy basilar opacity, slightly greater on the left.  IMPRESSION: Low lung volumes with patchy bibasilar opacity which could reflect atelectasis or pneumonia.  PA and lateral radiographs recommended when possible.  Original Report Authenticated By: Harley Hallmark, M.D.   Dg Foot 2 Views Right  07/02/2011  *  RADIOLOGY REPORT*  Clinical Data: Right foot wound infection with fever and swelling. History of  diabetes.  RIGHT FOOT - 2 VIEW  Comparison: 11/22/2010  Findings: Interval trans tarsal amputation of the right foot. Soft tissue defects at the stump of the amputation.  This could represent irregularities associated with the wound or new ulceration.  There is amorphous subcutaneous gas in the soft tissues along the medial aspect of the midfoot at the level of the navicular.  This is consistent with soft tissue infection with gas forming organism.  Visualized bones appear grossly intact.  There is no obvious bone sclerosis, periosteal reaction, or bone destruction to suggest osteomyelitis.  If clinically indicated, MRI would be more sensitive for detection of bone changes.  IMPRESSION: Interval trans tarsal amputation of the right foot with the residual or recurrent defect over the wound.  There is soft tissue gas medial to the navicular consistent with infection with gas forming organism.  No obvious changes of osteomyelitis.  Original Report Authenticated By: Marlon Pel, M.D.    ROS  As above and o/w neg. Blood pressure 126/54, pulse 97, temperature 98.9 F (37.2 C), temperature source Oral, resp. rate 31, height 5\' 7"  (1.702 m), weight 103.8 kg (228 lb 13.4 oz), last menstrual period 06/17/2011, SpO2 99.00%. Physical Exam  [age] wd woman in moderate distress.  A and O x 4.  Mood is anxious.  Affect is typical for her but somewhat flat.  Respirations unlabored.  EOMI.  R foot swollen and erythematous.  Seropurulent drainage from multiple wounds.  Surgical incision healed.  Brisk cap refill at forefoot.  5/5 strength in PF and DF at ankle.  + lymphangiitis.  Assessment/Plan: R foot gangrene and sepsis - to OR today for BKA.  I'll plan to leave the wound open and place a wound VAC.  Plan to return to the OR Tuesday for attempted closure.  This is a severe, limb and possibly life threatening infection that requires urgent amputation in an attempt to salvage the remainder of the limb and preserve an end  bearing stump.  The risks and benefits of the alternative treatment options have been discussed in detail.  The patient wishes to proceed with surgery and specifically understands risks of bleeding, infection, nerve damage, blood clots, need for additional surgery, amputation and death.   [name] 2011-07-11, 11:09 AM

## 2011-07-02 NOTE — Anesthesia Procedure Notes (Signed)
Procedure Name: Intubation Date/Time: 07/02/2011 1:25 PM Performed by: Darcey Nora B Pre-anesthesia Checklist: Patient identified, Emergency Drugs available, Suction available and Patient being monitored Patient Re-evaluated:Patient Re-evaluated prior to inductionOxygen Delivery Method: Circle system utilized Preoxygenation: Pre-oxygenation with 100% oxygen Intubation Type: IV induction Ventilation: Mask ventilation without difficulty Laryngoscope Size: Mac and 3 Grade View: Grade I Tube type: Oral Tube size: 7.5 mm Number of attempts: 1 Airway Equipment and Method: Stylet Placement Confirmation: ETT inserted through vocal cords under direct vision,  positive ETCO2 and breath sounds checked- equal and bilateral Secured at: 21 (cm at teeth) cm Tube secured with: Tape Dental Injury: Teeth and Oropharynx as per pre-operative assessment

## 2011-07-02 NOTE — Progress Notes (Signed)
Inpatient Diabetes Program Recommendations  AACE/ADA: New Consensus Statement on Inpatient Glycemic Control (2009)  Target Ranges:  Prepandial:   less than 140 mg/dL      Peak postprandial:   less than 180 mg/dL (1-2 hours)      Critically ill patients:  140 - 180 mg/dL   Reason for Visit: Previously high HgbA1C of 12.4 in Nov, 2012.  Inpatient Diabetes Program Recommendations HgbA1C: Please check.  Latest documentation in Nov of 2012 of 12.4%.  Note: Thank you, Lenor Coffin, RN, CNS, Diabetes Coordinator [phone])

## 2011-07-02 NOTE — Progress Notes (Signed)
Pt from PACU. VSS upon arrival to unit. BP 135/69, pulse ox 96% on 2L [place]. Patient reports no pain at this time. Wound vac intact to right foot. Suction at 125 mmHg. Will continue to monitor.

## 2011-07-02 NOTE — Anesthesia Postprocedure Evaluation (Signed)
Anesthesia Post Note  Patient: [name]  Procedure(s) Performed: Procedure(s) (LRB): AMPUTATION BELOW KNEE (Right) APPLICATION OF WOUND VAC (Right) IRRIGATION AND DEBRIDEMENT EXTREMITY (Left)  Anesthesia type: General  Patient location: PACU  Post pain: Pain level controlled and Adequate analgesia  Post assessment: Post-op Vital signs reviewed, Patient's Cardiovascular Status Stable, Respiratory Function Stable, Patent Airway and Pain level controlled  Last Vitals:  Filed Vitals:   07/02/11 1515  BP:   Pulse:   Temp: 36.2 C  Resp:     Post vital signs: Reviewed and stable  Level of consciousness: awake, alert  and oriented  Complications: No apparent anesthesia complications

## 2011-07-02 NOTE — Progress Notes (Signed)
Pt transported to the OR via bed while on the monitor. Pt tolerated well. VSS upon arrival to OR. Report given to RN.

## 2011-07-03 LAB — CBC
HCT: 22.3 % — ABNORMAL LOW (ref 36.0–46.0)
HCT: 21.8 % — ABNORMAL LOW (ref 36.0–46.0)
Hemoglobin: 7.2 g/dL — ABNORMAL LOW (ref 12.0–15.0)
Hemoglobin: 7.1 g/dL — ABNORMAL LOW (ref 12.0–15.0)
MCH: 27.1 pg (ref 26.0–34.0)
MCH: 27.5 pg (ref 26.0–34.0)
MCHC: 32.3 g/dL (ref 30.0–36.0)
MCHC: 32.6 g/dL (ref 30.0–36.0)
MCV: 83.8 fL (ref 78.0–100.0)
MCV: 84.5 fL (ref 78.0–100.0)
Platelets: 191 10*3/uL (ref 150–400)
Platelets: 209 10*3/uL (ref 150–400)
RBC: 2.66 MIL/uL — ABNORMAL LOW (ref 3.87–5.11)
RBC: 2.58 MIL/uL — ABNORMAL LOW (ref 3.87–5.11)
RDW: 15.7 % — ABNORMAL HIGH (ref 11.5–15.5)
RDW: 15.8 % — ABNORMAL HIGH (ref 11.5–15.5)
WBC: 6.5 10*3/uL (ref 4.0–10.5)
WBC: 6.6 10*3/uL (ref 4.0–10.5)

## 2011-07-03 LAB — GLUCOSE, CAPILLARY
Glucose-Capillary: 130 mg/dL — ABNORMAL HIGH (ref 70–99)
Glucose-Capillary: 135 mg/dL — ABNORMAL HIGH (ref 70–99)
Glucose-Capillary: 179 mg/dL — ABNORMAL HIGH (ref 70–99)
Glucose-Capillary: 211 mg/dL — ABNORMAL HIGH (ref 70–99)
Glucose-Capillary: 91 mg/dL (ref 70–99)

## 2011-07-03 LAB — COMPREHENSIVE METABOLIC PANEL
ALT: 12 U/L (ref 0–35)
AST: 20 U/L (ref 0–37)
Albumin: 2.1 g/dL — ABNORMAL LOW (ref 3.5–5.2)
Alkaline Phosphatase: 66 U/L (ref 39–117)
BUN: 11 mg/dL (ref 6–23)
CO2: 26 mEq/L (ref 19–32)
Calcium: 7.9 mg/dL — ABNORMAL LOW (ref 8.4–10.5)
Chloride: 103 mEq/L (ref 96–112)
Creatinine, Ser: 1.06 mg/dL (ref 0.50–1.10)
GFR calc Af Amer: 73 mL/min — ABNORMAL LOW (ref >90)
GFR calc non Af Amer: 63 mL/min — ABNORMAL LOW (ref >90)
Glucose, Bld: 164 mg/dL — ABNORMAL HIGH (ref 70–99)
Potassium: 4.3 mEq/L (ref 3.5–5.1)
Sodium: 137 mEq/L (ref 135–145)
Total Bilirubin: 0.2 mg/dL — ABNORMAL LOW (ref 0.3–1.2)
Total Protein: 6.5 g/dL (ref 6.0–8.3)

## 2011-07-03 LAB — PREPARE RBC (CROSSMATCH)

## 2011-07-03 MED ORDER — ACETAMINOPHEN 325 MG PO TABS
650.0000 mg | ORAL_TABLET | Freq: Four times a day (QID) | ORAL | Status: DC | PRN
  Administered 2011-07-03 – 2011-07-08 (×10): 650 mg via ORAL
  Filled 2011-07-03 (×9): qty 2
  Filled 2011-07-03: qty 1
  Filled 2011-07-03: qty 2

## 2011-07-03 NOTE — Progress Notes (Signed)
Pt profile: 46 yo diabetic with diabetic foot ulcer and right foot amputation on 11/24/2010. Presents to [facility] ED with pain, swelling, purulent malodorous discharge from the stump. Hypotensive, tachycardia and febrile upon presentation. Had one episode of non bloody, non bilious emesis while in ED. BP improved with fluid resuscitation  Active problems: Sepsis RLE gangrene - s/p BKA 6/28 [name]) DM/Htn  Lines, Tubes, etc: None  Microbiology: MRSA PCR 6/28 >> NEG Blood X 2 6/28 >>   Antibiotics:  Vanc 6/28 >> Zosyn 6/28 >>    Studies/Events: 6/28 R BKA, debridement L foot ulcer 6/29 back to or or debridement 6/29 t&c 2 units prbc and hold Consults:  Ortho [name])   Best Practice: DVT: SCDs SUP: N/I Nutrition: CHO modified Glycemic control:  Sedation/analgesia: dilaudid.oxycodone    Subj: No distress, no new complaints  Obj: Filed Vitals:   07/03/11 0900  BP: 103/42  Pulse: 99  Temp:   Resp:     Gen: NAD HEENT: WNL Neck: No JVD Chest: clear Cardiac: RRR s M JYN:WGNF, soft Ext: No edema, rle foot amputated. Left foot with wrap  Dg Chest Port 1 View  07/01/2011  *RADIOLOGY REPORT*  Clinical Data: 46 year old female with fever chest pain increased heart rate.  PORTABLE CHEST - 1 VIEW  Comparison: 10/24/2010.  Findings: Portable semi upright AP view 2327 hours.  Low lung volumes similar the prior study.  Cardiomegaly is difficult to exclude. Other mediastinal contours are within normal limits. Visualized tracheal air column is within normal limits.  No pneumothorax or large pleural effusion.  Pulmonary vascular congestion without overt edema.  Patchy basilar opacity, slightly greater on the left.  IMPRESSION: Low lung volumes with patchy bibasilar opacity which could reflect atelectasis or pneumonia.  PA and lateral radiographs recommended when possible.  Original Report Authenticated By: Harley Hallmark, M.D.   Dg Foot 2 Views Right  07/02/2011  *RADIOLOGY REPORT*   Clinical Data: Right foot wound infection with fever and swelling. History of diabetes.  RIGHT FOOT - 2 VIEW  Comparison: 11/22/2010  Findings: Interval trans tarsal amputation of the right foot. Soft tissue defects at the stump of the amputation.  This could represent irregularities associated with the wound or new ulceration.  There is amorphous subcutaneous gas in the soft tissues along the medial aspect of the midfoot at the level of the navicular.  This is consistent with soft tissue infection with gas forming organism.  Visualized bones appear grossly intact.  There is no obvious bone sclerosis, periosteal reaction, or bone destruction to suggest osteomyelitis.  If clinically indicated, MRI would be more sensitive for detection of bone changes.  IMPRESSION: Interval trans tarsal amputation of the right foot with the residual or recurrent defect over the wound.  There is soft tissue gas medial to the navicular consistent with infection with gas forming organism.  No obvious changes of osteomyelitis.  Original Report Authenticated By: Marlon Pel, M.D.    Lab 07/03/11 0350 07/02/11 0430 07/01/11 2354 07/01/11 2237  NA 137 135 133* --  K 4.3 3.4* 3.7 --  CL 103 100 101 --  CO2 26 21 -- 19  BUN 11 15 16  --  CREATININE 1.06 1.16* 1.60* --  GLUCOSE 164* 185* 203* --    Lab 07/03/11 0350 07/02/11 1602 07/02/11 0430  HGB 7.2* 10.2* 8.5*  HCT 22.3* 31.5* 26.2*  WBC 6.5 5.4 7.7  PLT 191 155 200    .  IMPRESSION: Sepsis due to gangrenous RLE(6/29 sepsis  resolved) Anemia   PLAN/RECS:  Cont abx Post op mgmt per Ortho Watch in ICU over night. If no complications transfer to regular bed  Has been cared for by [facility] in recent past 6/29 back to OR if stable post op will tx to floor and to [facility]/ 6/29 t&c 2 units prbc and tx for Hgb <7  Basename 07/03/11 0350 07/02/11 1602  HGB 7.2* 10.2[facility] Minor ACNP Adolph Pollack PCCM Pager [phone] till 3 pm If no answer page  [phone] 07/03/2011, 9:50 AM   STAFF NOTE: I, Dr Lavinia Sharps have personally reviewed patient's available data, including medical history, events of note, physical examination and test results as part of my evaluation. I have discussed with resident/NP and other care providers such as pharmacist, RN and RRT.  In addition,  I personally evaluated patient and elicited key findings of sepsis due to wound infecdtion from amputation and 3gm%hgb  Drop in 24h without bp compromise. Will transfuse only if hgb <7gm% unless actively bleeding.  Rest per NP/medical resident whose note is outlined above and that I agree with     Dr. Kalman Shan, M.D., [facility].C.P Pulmonary and Critical Care Medicine Staff Physician [facility] Pager: [phone], If no answer or between  15:00h - 7:00h: call [phone]  07/03/2011 12:28 PM

## 2011-07-03 NOTE — Op Note (Signed)
[name], [name] NO.:  [id number]  MEDICAL RECORD NO.:  [id number]  LOCATION:  2112                         FACILITY:  [facility]  PHYSICIAN:  [name], MD        DATE OF BIRTH:  05-26-1965  DATE OF PROCEDURE:  07/02/2011 DATE OF DISCHARGE:                              OPERATIVE REPORT   PREOPERATIVE DIAGNOSES: 1. Right foot gangrene. 2. Left foot plantar diabetic ulcer.  POSTOPERATIVE DIAGNOSES: 1. Right foot gangrene. 2. Left foot plantar diabetic ulcer.  PROCEDURE: 1. Right below-knee amputation. 2. Application of wound VAC to right below-knee amputation stump. 3. Irrigation and excisional debridement of left plantar foot wound     including skin and subcutaneous tissue, 1 cm x 1 cm.  SURGEON:  [name], M.D.  ANESTHESIA:  General, regional.  ESTIMATED BLOOD LOSS:  400 mL.  TOURNIQUET TIME:  26 minutes at 250 mmHg at the right thigh.  COMPLICATIONS:  None apparent.  DISPOSITION:  Extubated, awake, and stable to recovery.  INDICATIONS FOR PROCEDURE:  Patient is a 46 year old female with past medical history significant for poorly controlled type 2 diabetes.  She was seen in clinic 10 days ago with cellulitis and ascending lymphangitis of her right lower extremity.  Plans were made at that time to admit her directly to the hospital for IV antibiotics, MRI of her foot, and surgical treatment.  She refused to be admitted to the hospital at that time.  Multiple attempts were made over the following week to reach her and her family to arrange for admission.  She again refused admission.  She presented to the emergency department last night with hypertension, tachycardia, and was afebrile.  She was admitted to the Intensive Care Unit and started on IV antibiotics.  She refused treatment by Dr. Ranell Patrick overnight and states that she would only be seen by me.  She presents now for amputation of her right lower extremity and I and D of her left  plantar foot wound.  She understands the risks and benefits, the alternative treatment options and elects below-knee amputation for treatment of this severe limb and possibly life- threatening infection.  PROCEDURE IN DETAIL:  After preoperative consent was obtained, the correct operative site was identified.  The patient was brought to the operating room and placed supine in the operating table.  General anesthesia was induced.  The patient was already on therapeutic antibiotics.  A surgical time-out was taken.  Both lower extremities were prepped and draped in standard sterile fashion with the tourniquet around the right thigh.  A posterior flap incision was marked on the skin of the right leg.  The foot was exsanguinated and tourniquet was inflated to 250 mmHg.  The previously marked incision was made and sharp dissection was carried down through the skin and subcutaneous tissue to the level of the bone.  Periosteum was elevated at the tibia and the tibia was cut approximately 1 cm proximal from the skin incision.  Blunt dissection was then carried down to the fibula and subperiosteal dissection was carried around the fibula and the fibula was transected about 2 cm proximal from the tibial cut.  The posterior flap was then  made with amputation knife.  The leg was then passed off the field as a specimen to Pathology.  The named nerves were all placed on traction and sharply divided and allowed to retract into healthy soft tissue.  The named arteries were all tied with 2-0 silk ties.  The wound was irrigated copiously with 3 L of normal saline, 40 mL of Exparel mixed with normal saline, was injected into the subcutaneous tissues circumferentially.  The tourniquet was released at 26 minutes. Hemostasis was achieved.  A wound VAC sponge was stapled into place and an occlusive dressing was applied, 125 mmHg suction was applied and the dressing appeared stable.  Attention was then turned  to the left lower extremity.  The plantar ulcer was identified.  A scalpel was then used to circumferentially debride necrotic tissue and callus around the wound.  Significant fibrinous exudate was excised sharply from the wound bed.  Healthy granulation tissue was present at the bed of the wound.  There was no purulent material.  There was no exposed bone evident.  Dry dressing was applied after irrigating the wound.  A compression wrap was also applied.  At this point, the right lower extremity wound VAC had stopped working effectively.  Wound VAC dressing was removed along with a sponge.  There was 1 small area of arterial bleeding, this was clamped and tied with a 2-0 silk tie.  At this point, the wound appeared dry.  A new wound VAC sponge was cut and secured with staples.  The occlusive dressings were applied, followed by 125 mmHg suction.  The wound VAC appeared to be working appropriately.  The patient was then awakened from anesthesia and transported to the recovery room in stable condition.  FOLLOWUP PLAN:  The patient will be readmitted to the Intensive Care Unit for observation and continued IV antibiotics.  She will need a return trip to the operating room next week for repeat I and D of her leg and revision amputation versus wound VAC change.     [name], MD     JH/MEDQ  D:  07/02/2011  T:  07/03/2011  Job:  [id number]

## 2011-07-03 NOTE — Progress Notes (Signed)
ANTIBIOTIC CONSULT NOTE - FOLLOW UP  Pharmacy Consult for Vancomycin and Zosyn Indication: Infected foot  No Known Allergies  Patient Measurements: Height: 5\' 7"  (170.2 cm) Weight: 228 lb 13.4 oz (103.8 kg) IBW/kg (Calculated) : 61.6  Adjusted Body Weight:   Vital Signs: Temp: 100.7 F (38.2 C) (06/29 1200) Temp src: Oral (06/29 1200) BP: 103/42 mmHg (06/29 0900) Pulse Rate: 99  (06/29 0900) Intake/Output from previous day: 06/28 0701 - 06/29 0700 In: 2970 [P.O.:120; I.V.:2400; IV Piggyback:450] Out: 1150 [Urine:450; Drains:300; Blood:400] Intake/Output from this shift: Total I/O In: 112.5 [I.V.:100; IV Piggyback:12.5] Out: 0   Labs:  [facility] 07/03/11 0350 07/02/11 1602 07/02/11 0430 07/01/11 2354  WBC 6.5 5.4 7.7 --  HGB 7.2* 10.2* 8.5* --  PLT 191 155 200 --  LABCREA -- -- -- --  CREATININE 1.06 -- 1.16* 1.60*   Estimated Creatinine Clearance: 83.9 ml/min (by C-G formula based on Cr of 1.06). No results found for this basename: VANCOTROUGH:2,VANCOPEAK:2,VANCORANDOM:2,GENTTROUGH:2,GENTPEAK:2,GENTRANDOM:2,TOBRATROUGH:2,TOBRAPEAK:2,TOBRARND:2,AMIKACINPEAK:2,AMIKACINTROU:2,AMIKACIN:2, in the last 72 hours   Microbiology: Recent Results (from the past 720 hour(s))  CULTURE, BLOOD (ROUTINE X 2)     Status: Normal (Preliminary result)   Collection Time   07/01/11 10:15 PM      Component Value Range Status Comment   Specimen Description BLOOD LEFT ARM   Final    Special Requests BOTTLES DRAWN AEROBIC AND ANAEROBIC 5CC   Final    Culture  Setup Time 07/02/2011 11:15   Final    Culture     Final    Value:        BLOOD CULTURE RECEIVED NO GROWTH TO DATE CULTURE WILL BE HELD FOR 5 DAYS BEFORE ISSUING A FINAL NEGATIVE REPORT   Report Status PENDING   Incomplete   CULTURE, BLOOD (ROUTINE X 2)     Status: Normal (Preliminary result)   Collection Time   07/01/11 10:30 PM      Component Value Range Status Comment   Specimen Description BLOOD LEFT HAND   Final    Special  Requests BOTTLES DRAWN AEROBIC AND ANAEROBIC 5CC   Final    Culture  Setup Time 07/02/2011 11:15   Final    Culture     Final    Value:        BLOOD CULTURE RECEIVED NO GROWTH TO DATE CULTURE WILL BE HELD FOR 5 DAYS BEFORE ISSUING A FINAL NEGATIVE REPORT   Report Status PENDING   Incomplete   MRSA PCR SCREENING     Status: Normal   Collection Time   07/02/11  3:00 AM      Component Value Range Status Comment   MRSA by PCR NEGATIVE  NEGATIVE Final     Anti-infectives     Start     Dose/Rate Route Frequency Ordered Stop   07/02/11 1400   clindamycin (CLEOCIN) IVPB 900 mg  Status:  Discontinued        900 mg 100 mL/hr over 30 Minutes Intravenous 3 times per day 07/02/11 0824 07/02/11 1046   07/02/11 1000   vancomycin (VANCOCIN) IVPB 1000 mg/200 mL premix        1,000 mg 200 mL/hr over 60 Minutes Intravenous Every 12 hours 07/02/11 0104     07/02/11 0800  piperacillin-tazobactam (ZOSYN) IVPB 3.375 g       3.375 g 12.5 mL/hr over 240 Minutes Intravenous Every 8 hours 07/02/11 0104     07/02/11 0100   clindamycin (CLEOCIN) IVPB 900 mg  Status:  Discontinued  900 mg 100 mL/hr over 30 Minutes Intravenous 4 times per day 07/02/11 0037 07/02/11 0824   07/02/11 0045   clindamycin (CLEOCIN) IVPB 600 mg  Status:  Discontinued        600 mg 100 mL/hr over 30 Minutes Intravenous  Once 07/02/11 0030 07/02/11 0037   07/02/11 0015   vancomycin (VANCOCIN) IVPB 1000 mg/200 mL premix        1,000 mg 200 mL/hr over 60 Minutes Intravenous  Once 07/02/11 0000 07/02/11 0231   07/02/11 0015  piperacillin-tazobactam (ZOSYN) IVPB 3.375 g       3.375 g 100 mL/hr over 30 Minutes Intravenous  Once 07/02/11 0000 07/02/11 0123          Assessment: Pt had surgery for BKA for infected foot and sepsis on 6/28. She was observed in ICU overnight and continued antibiotics. Plan is to move pt to the floor today.    Goal of Therapy:  Vancomycin trough level 15-20 mcg/ml  Plan:  Continue  antibiotics at same dose. If pt to remain on Vancomycin, will need a level in a couple of days.  Eugene Garnet 07/03/2011,2:49 PM

## 2011-07-03 NOTE — Progress Notes (Signed)
Spoke with CCM Brett Canales Minor, NP) concerning SKIP core measures for patient care.

## 2011-07-03 NOTE — Progress Notes (Addendum)
Subjective: 1 Day Post-Op Procedure(s) (LRB): AMPUTATION BELOW KNEE (Right) APPLICATION OF WOUND VAC (Right) IRRIGATION AND DEBRIDEMENT EXTREMITY (Left) Patient reports pain as mild.   Patient seen in rounds with Dr. Darrelyn Hillock. Patient is well, and has had no acute complaints or problems. She is up and eating breakfast during rounds. No complaints of chest pain or shortness of breath. Pain is under good control. Patient reports fatigue.  Plan is to go Home after hospital stay.  Objective: Vital signs in last 24 hours: Temp:  [97.2 F (36.2 C)-102.8 F (39.3 C)] 99 F (37.2 C) (06/29 0810) Pulse Rate:  [88-117] 99  (06/29 0900) Resp:  [18-31] 24  (06/29 0500) BP: (101-149)/(42-87) 103/42 mmHg (06/29 0900) SpO2:  [87 %-100 %] 92 % (06/29 0900)  Intake/Output from previous day:  Intake/Output Summary (Last 24 hours) at 07/03/11 0907 Last data filed at 07/03/11 1610  Gross per 24 hour  Intake 3082.5 ml  Output   1150 ml  Net 1932.5 ml    Intake/Output this shift: Total I/O In: 112.5 [I.V.:100; IV Piggyback:12.5] Out: -   Labs:  Basename 07/03/11 0350 07/02/11 1602 07/02/11 0430 07/01/11 2354 07/01/11 2209  HGB 7.2* 10.2* 8.5* 9.2* 9.8*    Basename 07/03/11 0350 07/02/11 1602  WBC 6.5 5.4  RBC 2.66* 3.78*  HCT 22.3* 31.5*  PLT 191 155    Basename 07/03/11 0350 07/02/11 0430  NA 137 135  K 4.3 3.4*  CL 103 100  CO2 26 21  BUN 11 15  CREATININE 1.06 1.16*  GLUCOSE 164* 185*  CALCIUM 7.9* 8.1*    Basename 07/02/11 0054  LABPT --  INR 1.55*    EXAM General - Patient is Alert and Oriented Extremity - Neurologically intact Knee immobilizer in place Dressing/Incision - Wound vac applied yesterday 07/02/2011   Past Medical History  Diagnosis Date  . Diabetes mellitus   . Hypertension   . Arthritis   . Neuropathy   . Diabetic foot ulcer associated with type 2 diabetes mellitus   . Intracerebral hemorrhage As a teenager     States she had burst blood  vessel as teenager, now with resultant minor visual field and hearing deficits    Assessment/Plan: 1 Day Post-Op Procedure(s) (LRB): AMPUTATION BELOW KNEE (Right) APPLICATION OF WOUND VAC (Right) IRRIGATION AND DEBRIDEMENT EXTREMITY (Left) Active Problems:  Diabetic foot ulcer associated with type 2 diabetes mellitus  Diabetes mellitus  Gas gangrene  Sepsis  Acute renal failure   Advance diet Patient may need 2 units packed cells. Will type and screen. If Hgb below 7.0 will transfuse. Will continue wound vac.  Patient scheduled for I&D of right BKA Tuesday  Cinthya Bors LAUREN 07/03/2011, 9:07 AM

## 2011-07-03 NOTE — Progress Notes (Signed)
Evaluated for transfer.  Appears stable for SDU.  PCCM will sign off.  Signed-off to Dr. Sharon Seller [facility]).  Orlean Bradford, M.D., F.C.C.P. Pulmonary and Critical Care Medicine [facility] Cell: [phone] Pager: [phone]

## 2011-07-04 DIAGNOSIS — E118 Type 2 diabetes mellitus with unspecified complications: Secondary | ICD-10-CM

## 2011-07-04 DIAGNOSIS — E1165 Type 2 diabetes mellitus with hyperglycemia: Secondary | ICD-10-CM

## 2011-07-04 LAB — CBC
HCT: 20.4 % — ABNORMAL LOW (ref 36.0–46.0)
HCT: 24.3 % — ABNORMAL LOW (ref 36.0–46.0)
Hemoglobin: 6.6 g/dL — CL (ref 12.0–15.0)
Hemoglobin: 7.9 g/dL — ABNORMAL LOW (ref 12.0–15.0)
MCH: 27.4 pg (ref 26.0–34.0)
MCH: 27.5 pg (ref 26.0–34.0)
MCHC: 32.4 g/dL (ref 30.0–36.0)
MCHC: 32.5 g/dL (ref 30.0–36.0)
MCV: 84.6 fL (ref 78.0–100.0)
MCV: 84.7 fL (ref 78.0–100.0)
Platelets: 194 10*3/uL (ref 150–400)
Platelets: 231 10*3/uL (ref 150–400)
RBC: 2.41 MIL/uL — ABNORMAL LOW (ref 3.87–5.11)
RBC: 2.87 MIL/uL — ABNORMAL LOW (ref 3.87–5.11)
RDW: 16.1 % — ABNORMAL HIGH (ref 11.5–15.5)
RDW: 15.9 % — ABNORMAL HIGH (ref 11.5–15.5)
WBC: 5.4 10*3/uL (ref 4.0–10.5)
WBC: 6.6 10*3/uL (ref 4.0–10.5)

## 2011-07-04 LAB — GLUCOSE, CAPILLARY
Glucose-Capillary: 100 mg/dL — ABNORMAL HIGH (ref 70–99)
Glucose-Capillary: 112 mg/dL — ABNORMAL HIGH (ref 70–99)
Glucose-Capillary: 122 mg/dL — ABNORMAL HIGH (ref 70–99)
Glucose-Capillary: 142 mg/dL — ABNORMAL HIGH (ref 70–99)

## 2011-07-04 LAB — BASIC METABOLIC PANEL
BUN: 8 mg/dL (ref 6–23)
CO2: 23 mEq/L (ref 19–32)
Calcium: 8.1 mg/dL — ABNORMAL LOW (ref 8.4–10.5)
Chloride: 101 mEq/L (ref 96–112)
Creatinine, Ser: 1.04 mg/dL (ref 0.50–1.10)
GFR calc Af Amer: 75 mL/min — ABNORMAL LOW (ref >90)
GFR calc non Af Amer: 64 mL/min — ABNORMAL LOW (ref >90)
Glucose, Bld: 103 mg/dL — ABNORMAL HIGH (ref 70–99)
Potassium: 3.7 mEq/L (ref 3.5–5.1)
Sodium: 135 mEq/L (ref 135–145)

## 2011-07-04 LAB — PROTIME-INR
INR: 1.37 (ref 0.00–1.49)
Prothrombin Time: 17.1 seconds — ABNORMAL HIGH (ref 11.6–15.2)

## 2011-07-04 LAB — APTT: aPTT: 37 seconds (ref 24–37)

## 2011-07-04 LAB — PHOSPHORUS: Phosphorus: 3.6 mg/dL (ref 2.3–4.6)

## 2011-07-04 LAB — MAGNESIUM: Magnesium: 2 mg/dL (ref 1.5–2.5)

## 2011-07-04 NOTE — Progress Notes (Signed)
eLink Physician-Brief Progress Note Patient Name: [name] DOB: 12-22-1965 MRN: [id number]  Date of Service  07/04/2011   HPI/Events of Note  Hgb 6.6 down from 7.1   eICU Interventions  Plan: Transfuse one unit of pRBCs Post-transfusion CBC   Intervention Category Intermediate Interventions: Bleeding - evaluation and treatment with blood products  Qunisha Bryk 07/04/2011, 5:08 AM

## 2011-07-04 NOTE — Progress Notes (Signed)
[facility] PROGRESS NOTE  ARTASIA THANG ZOX:[id number] DOB: 1965-11-12 DOA: 07/01/2011 PCP: [name], MD  Assessment/Plan:  Diabetic foot ulcer associated with type 2 diabetes mellitus status post BKA Patient with diabetes foot/gangrene she status post below-knee amputation and doing well. She is on antibiotic. Followup per orthopedics. No   Diabetes mellitus     Acute renal failure   Code Status: Full  Family Communication: None Disposition Plan: When medically stable  Molli Posey, MD  [facility] Pager [phone]  If 8PM-8AM, please contact night-coverage [email or web address] Password [email or web address] 07/04/2011, 6:10 PM   LOS: 3 days   Brief narrative: Patient is a 46 yo diabetic patient with diabetic foot ulcer and right foot amputation on 11/24/2010. Presents to [facility] ED with pain, swelling, purulent malodorous discharge from the stump. Hypotensive, tachycardia and febrile upon presentation. Patient was initially on CCM service. Patient was transferred to try hospitalist on June 30.     Consultants:  Orthopedic  Procedures: Below-the-knee amputation  Antibiotics:  Zosyn and vancomycin ?  HPI/Subjective: Patient is without any complaints today.  Objective: Filed Vitals:   07/04/11 0800 07/04/11 0905 07/04/11 1300 07/04/11 1708  BP: 136/69 135/67 130/62 161/61  Pulse: 88 95 100 110  Temp: 99.2 F (37.3 C) 98.8 F (37.1 C) 102 F (38.9 C) 98.8 F (37.1 C)  TempSrc: Oral Oral Oral Oral  Resp: 18  23 19   Height:      Weight:      SpO2: 98% 96% 93% 89%    Intake/Output Summary (Last 24 hours) at 07/04/11 1810 Last data filed at 07/04/11 1600  Gross per 24 hour  Intake   2225 ml  Output   1550 ml  Net    675 ml    Exam:   General: Patient is sitting up in bed without any complaints.   Cardiovascular: Regular rate and rhythm  Respiratory: Clear to auscultations bilaterally  Extremities: Left lower extremity wrap dressing and right  lower extremity status post BKA  Data Reviewed: Basic Metabolic Panel:  Lab 07/04/11 8295 07/03/11 0350 07/02/11 0430 07/01/11 2354 07/01/11 2237  NA 135 137 135 133* 129*  K 3.7 4.3 -- -- --  CL 101 103 100 101 92*  CO2 23 26 21  -- 19  GLUCOSE 103* 164* 185* 203* 227*  BUN 8 11 15 16 17   CREATININE 1.04 1.06 1.16* 1.60* 1.36*  CALCIUM 8.1* 7.9* 8.1* -- 8.8  MG 2.0 -- 1.7 -- --  PHOS 3.6 -- 3.0 -- --   Liver Function Tests:  Lab 07/03/11 0350 07/01/11 2237  AST 20 30  ALT 12 16  ALKPHOS 66 96  BILITOT 0.2* 0.3  PROT 6.5 8.8*  ALBUMIN 2.1* 3.0*    CBC:  Lab 07/04/11 0937 07/04/11 0420 07/03/11 1600 07/03/11 0350 07/02/11 1602  WBC 6.6 5.4 6.6 6.5 5.4  NEUTROABS -- -- -- -- --  HGB 7.9* 6.6* 7.1* 7.2* 10.2*  HCT 24.3* 20.4* 21.8* 22.3* 31.5*  MCV 84.7 84.6 84.5 83.8 83.3  PLT 231 194 209 191 155    CBG:  Lab 07/04/11 1707 07/04/11 1157 07/04/11 0732 07/03/11 2132 07/03/11 1815  GLUCAP 122* 112* 100* 91 130*    Recent Results (from the past 240 hour(s))  CULTURE, BLOOD (ROUTINE X 2)     Status: Normal (Preliminary result)   Collection Time   07/01/11 10:15 PM      Component Value Range Status Comment   Specimen Description BLOOD LEFT ARM  Final    Special Requests BOTTLES DRAWN AEROBIC AND ANAEROBIC 5CC   Final    Culture  Setup Time 07/02/2011 11:15   Final    Culture     Final    Value:        BLOOD CULTURE RECEIVED NO GROWTH TO DATE CULTURE WILL BE HELD FOR 5 DAYS BEFORE ISSUING A FINAL NEGATIVE REPORT   Report Status PENDING   Incomplete   CULTURE, BLOOD (ROUTINE X 2)     Status: Normal (Preliminary result)   Collection Time   07/01/11 10:30 PM      Component Value Range Status Comment   Specimen Description BLOOD LEFT HAND   Final    Special Requests BOTTLES DRAWN AEROBIC AND ANAEROBIC 5CC   Final    Culture  Setup Time 07/02/2011 11:15   Final    Culture     Final    Value:        BLOOD CULTURE RECEIVED NO GROWTH TO DATE CULTURE WILL BE HELD FOR 5  DAYS BEFORE ISSUING A FINAL NEGATIVE REPORT   Report Status PENDING   Incomplete   MRSA PCR SCREENING     Status: Normal   Collection Time   07/02/11  3:00 AM      Component Value Range Status Comment   MRSA by PCR NEGATIVE  NEGATIVE Final      Studies: Dg Chest Port 1 View  07/01/2011  *RADIOLOGY REPORT*  Clinical Data: 46 year old female with fever chest pain increased heart rate.  PORTABLE CHEST - 1 VIEW  Comparison: 10/24/2010.  Findings: Portable semi upright AP view 2327 hours.  Low lung volumes similar the prior study.  Cardiomegaly is difficult to exclude. Other mediastinal contours are within normal limits. Visualized tracheal air column is within normal limits.  No pneumothorax or large pleural effusion.  Pulmonary vascular congestion without overt edema.  Patchy basilar opacity, slightly greater on the left.  IMPRESSION: Low lung volumes with patchy bibasilar opacity which could reflect atelectasis or pneumonia.  PA and lateral radiographs recommended when possible.  Original Report Authenticated By: Harley Hallmark, M.D.   Dg Foot 2 Views Right  07/02/2011  *RADIOLOGY REPORT*  Clinical Data: Right foot wound infection with fever and swelling. History of diabetes.  RIGHT FOOT - 2 VIEW  Comparison: 11/22/2010  Findings: Interval trans tarsal amputation of the right foot. Soft tissue defects at the stump of the amputation.  This could represent irregularities associated with the wound or new ulceration.  There is amorphous subcutaneous gas in the soft tissues along the medial aspect of the midfoot at the level of the navicular.  This is consistent with soft tissue infection with gas forming organism.  Visualized bones appear grossly intact.  There is no obvious bone sclerosis, periosteal reaction, or bone destruction to suggest osteomyelitis.  If clinically indicated, MRI would be more sensitive for detection of bone changes.  IMPRESSION: Interval trans tarsal amputation of the right foot with  the residual or recurrent defect over the wound.  There is soft tissue gas medial to the navicular consistent with infection with gas forming organism.  No obvious changes of osteomyelitis.  Original Report Authenticated By: Marlon Pel, M.D.    Scheduled Meds:   . insulin aspart  0-15 Units Subcutaneous TID WC  . insulin aspart  0-5 Units Subcutaneous QHS  . insulin aspart  4 Units Subcutaneous TID WC  . piperacillin-tazobactam (ZOSYN)  IV  3.375 g Intravenous Q8H  .  vancomycin  1,000 mg Intravenous Q12H   Continuous Infusions:   . sodium chloride 50 mL/hr at 07/04/11 1210

## 2011-07-04 NOTE — Progress Notes (Signed)
Orthopedic Tech Progress Note Patient Details:  [name] [birth year] [id number]  Patient ID: [name], female   DOB: November 24, 1965, 46 y.o.   MRN: [id number]   [name] 07/04/2011, 6:32 PM PT HAD KNEE IMMOBILIZER ON

## 2011-07-04 NOTE — Progress Notes (Signed)
[name] 45 y.o. 07/01/2011  gangrene right foot ,left foot ulcer  2 Days Post-Op   Subjective Patient complaints:doing well with some problems : pain level 7/10  Objective Wound: dressing C/D/I and no drainage  Lab. Results: [facility]   07/04/11  07/03/11             0420      1600      WBC        5.4       6.6       HGB        6.6*      7.1*      HCT        20.4*     21.8*     PLT        194       209       BMET Basename   07/04/11  07/03/11             0420      0350      NA         135       137       K          3.7       4.3       CL         101       103       CO2        23        26        GLUCOSE    103*      164*      BUN        8         11        CREATININE 1.04      1.06      CALCIUM    8.1*      7.9*       INR (no units)  Date                       Value       Range   Status  07/04/2011                   1.37  0.00 - 1.*   Final ---------- VITALS ---------------------------              07/04/11                     0800        ---------------------------  BP:          136/69        Pulse:         88          Temp:   99.2 F (37.3 C)  Resp:          18         ---------------------------  Dg Chest Port 1 View  07/01/2011  *RADIOLOGY REPORT*  Clinical Data: 46 year old female with fever chest pain increased heart rate.  PORTABLE CHEST - 1 VIEW  Comparison: 10/24/2010.  Findings: Portable semi upright AP view 2327 hours.  Low lung volumes similar the prior study.  Cardiomegaly is difficult to exclude. Other mediastinal contours are within normal limits. Visualized tracheal air column is within normal limits.  No pneumothorax  or large pleural effusion.  Pulmonary vascular congestion without overt edema.  Patchy basilar opacity, slightly greater on the left.  IMPRESSION: Low lung volumes with patchy bibasilar opacity which could reflect atelectasis or pneumonia.  PA and lateral radiographs recommended when possible.  Original  Report Authenticated By: Harley Hallmark, M.D.   Dg Foot 2 Views Right  07/02/2011  *RADIOLOGY REPORT*  Clinical Data: Right foot wound infection with fever and swelling. History of diabetes.  RIGHT FOOT - 2 VIEW  Comparison: 11/22/2010  Findings: Interval trans tarsal amputation of the right foot. Soft tissue defects at the stump of the amputation.  This could represent irregularities associated with the wound or new ulceration.  There is amorphous subcutaneous gas in the soft tissues along the medial aspect of the midfoot at the level of the navicular.  This is consistent with soft tissue infection with gas forming organism.  Visualized bones appear grossly intact.  There is no obvious bone sclerosis, periosteal reaction, or bone destruction to suggest osteomyelitis.  If clinically indicated, MRI would be more sensitive for detection of bone changes.  IMPRESSION: Interval trans tarsal amputation of the right foot with the residual or recurrent defect over the wound.  There is soft tissue gas medial to the navicular consistent with infection with gas forming organism.  No obvious changes of osteomyelitis.  Original Report Authenticated By: Marlon Pel, M.D.     Assessment/ Plan Patient: Postoperative course complicated by anemia Plan: transfusion per primary service No new ortho recommendations Dr [name] to re-eval in the AM   [facility] 6/30/20139:52 AM

## 2011-07-04 NOTE — Progress Notes (Signed)
CRITICAL VALUE ALERT  Critical value received:  hgb 6.6  Date of notification:  07/04/11  Time of notification: 0430  Critical value read back:yes  Nurse who received alert:  Doren Custard  MD notified (1st page):E. deterding  Time of first page:  0430  MD notified (2nd page):  Time of second page:  Responding MD:  E deterding  Time MD responded: 0430

## 2011-07-05 ENCOUNTER — Encounter (HOSPITAL_COMMUNITY): Payer: Self-pay | Admitting: Orthopedic Surgery

## 2011-07-05 DIAGNOSIS — L089 Local infection of the skin and subcutaneous tissue, unspecified: Secondary | ICD-10-CM

## 2011-07-05 DIAGNOSIS — I10 Essential (primary) hypertension: Secondary | ICD-10-CM

## 2011-07-05 DIAGNOSIS — T148XXA Other injury of unspecified body region, initial encounter: Secondary | ICD-10-CM

## 2011-07-05 DIAGNOSIS — E119 Type 2 diabetes mellitus without complications: Secondary | ICD-10-CM

## 2011-07-05 LAB — BASIC METABOLIC PANEL
BUN: 6 mg/dL (ref 6–23)
CO2: 24 mEq/L (ref 19–32)
Calcium: 8.2 mg/dL — ABNORMAL LOW (ref 8.4–10.5)
Chloride: 99 mEq/L (ref 96–112)
Creatinine, Ser: 0.94 mg/dL (ref 0.50–1.10)
GFR calc Af Amer: 84 mL/min — ABNORMAL LOW (ref >90)
GFR calc non Af Amer: 73 mL/min — ABNORMAL LOW (ref >90)
Glucose, Bld: 133 mg/dL — ABNORMAL HIGH (ref 70–99)
Potassium: 3.5 mEq/L (ref 3.5–5.1)
Sodium: 133 mEq/L — ABNORMAL LOW (ref 135–145)

## 2011-07-05 LAB — CBC
HCT: 22.8 % — ABNORMAL LOW (ref 36.0–46.0)
Hemoglobin: 7.3 g/dL — ABNORMAL LOW (ref 12.0–15.0)
MCH: 27 pg (ref 26.0–34.0)
MCHC: 32 g/dL (ref 30.0–36.0)
MCV: 84.4 fL (ref 78.0–100.0)
Platelets: 236 10*3/uL (ref 150–400)
RBC: 2.7 MIL/uL — ABNORMAL LOW (ref 3.87–5.11)
RDW: 16.1 % — ABNORMAL HIGH (ref 11.5–15.5)
WBC: 5.8 10*3/uL (ref 4.0–10.5)

## 2011-07-05 LAB — GLUCOSE, CAPILLARY
Glucose-Capillary: 112 mg/dL — ABNORMAL HIGH (ref 70–99)
Glucose-Capillary: 148 mg/dL — ABNORMAL HIGH (ref 70–99)
Glucose-Capillary: 92 mg/dL (ref 70–99)
Glucose-Capillary: 118 mg/dL — ABNORMAL HIGH (ref 70–99)

## 2011-07-05 LAB — PREPARE RBC (CROSSMATCH)

## 2011-07-05 MED ORDER — SODIUM CHLORIDE 0.9 % IV SOLN: INTRAVENOUS | Status: DC

## 2011-07-05 MED ORDER — BENAZEPRIL HCL 10 MG PO TABS
10.0000 mg | ORAL_TABLET | Freq: Every day | ORAL | Status: DC
  Administered 2011-07-05 – 2011-07-08 (×4): 10 mg via ORAL
  Filled 2011-07-05 (×5): qty 1

## 2011-07-05 MED ORDER — PIPERACILLIN-TAZOBACTAM 3.375 G IVPB
3.3750 g | Freq: Three times a day (TID) | INTRAVENOUS | Status: DC
  Administered 2011-07-07 – 2011-07-09 (×7): 3.375 g via INTRAVENOUS
  Filled 2011-07-05 (×12): qty 50

## 2011-07-05 MED ORDER — CHLORHEXIDINE GLUCONATE 4 % EX LIQD
60.0000 mL | Freq: Once | CUTANEOUS | Status: DC
  Filled 2011-07-05 (×2): qty 60

## 2011-07-05 MED ORDER — FUROSEMIDE 10 MG/ML IJ SOLN
20.0000 mg | Freq: Once | INTRAMUSCULAR | Status: AC
  Administered 2011-07-05: 20 mg via INTRAVENOUS

## 2011-07-05 MED ORDER — FUROSEMIDE 10 MG/ML IJ SOLN
INTRAMUSCULAR | Status: AC
  Filled 2011-07-05: qty 4

## 2011-07-05 MED ORDER — INSULIN GLARGINE 100 UNIT/ML ~~LOC~~ SOLN
5.0000 [IU] | Freq: Every day | SUBCUTANEOUS | Status: DC
  Administered 2011-07-05 – 2011-07-08 (×4): 5 [IU] via SUBCUTANEOUS

## 2011-07-05 NOTE — Progress Notes (Signed)
Subjective: 3 Days Post-Op Procedure(s) (LRB): AMPUTATION BELOW KNEE (Right) APPLICATION OF WOUND VAC (Right) IRRIGATION AND DEBRIDEMENT EXTREMITY (Left) Patient reports pain as mild.  Wound VAC became blocked overnight and was removed this morning.  Sponge left in place and moist dressing applied.  Pt denies f/c/n/v.  Objective: Vital signs in last 24 hours: Temp:  [98.8 F (37.1 C)-102.4 F (39.1 C)] 99.4 F (37.4 C) (07/01 0747) Pulse Rate:  [90-110] 90  (07/01 0408) Resp:  [16-24] 24  (07/01 0408) BP: (126-161)/(61-91) 136/64 mmHg (07/01 0747) SpO2:  [89 %-100 %] 97 % (07/01 0747)  Intake/Output from previous day: 06/30 0701 - 07/01 0700 In: 2322 [P.O.:600; I.V.:1410; Blood:35; IV Piggyback:277] Out: 2225 [Urine:2225] Intake/Output this shift: Total I/O In: -  Out: 75 [Emesis/NG output:75]   Basename 07/05/11 0420 07/04/11 0937 07/04/11 0420 07/03/11 1600 07/03/11 0350  HGB 7.3* 7.9* 6.6* 7.1* 7.2*    Basename 07/05/11 0420 07/04/11 0937  WBC 5.8 6.6  RBC 2.70* 2.87*  HCT 22.8* 24.3*  PLT 236 231    Basename 07/05/11 0420 07/04/11 0420  NA 133* 135  K 3.5 3.7  CL 99 101  CO2 24 23  BUN 6 8  CREATININE 0.94 1.04  GLUCOSE 133* 103*  CALCIUM 8.2* 8.1*    Basename 07/04/11 0420  LABPT --  INR 1.37    PE:  R LE dressing removed.  Sponge removed.  Clot present in sponge.  Wound bed appears healthy.  No purulence.  No excessive bleeding.  Assessment/Plan: 3 Days Post-Op Procedure(s) (LRB): AMPUTATION BELOW KNEE (Right) APPLICATION OF WOUND VAC (Right) IRRIGATION AND DEBRIDEMENT EXTREMITY (Left)  Wound VAC dressing re-applied.  To OR tomorrow for repeat I and D and dressing change v. Closure of BKA wound.  NPO after midnight.  [name] 07/05/2011, 10:53 AM

## 2011-07-05 NOTE — Progress Notes (Signed)
End first unit of blood, VSS. Documented per policy  Wound vac is also pulling properly since AM dsg change.  Will continue to monitor.

## 2011-07-05 NOTE — Progress Notes (Signed)
Wound Vac not pulling drainage and alarming. Unwrapped BKA, noted some fresh blood drainage. Unable to get wound vac to pull. Charge Nurse called to bedside to visualize as well. Dr. [name] paged for ortho and wound vac. Dr. Earlene Plater called for refusal of pRBC's until able to speak with Dr. In person. Currently asymptomatic VSS, Hgb 7.3. Explained need for blood. Will continue to monitor for further changes.

## 2011-07-05 NOTE — Progress Notes (Signed)
Pt transferred from 3300 via stretcher. Pt alert and oriented, VSS and has not complained of pain. Wound Vac intact. Patient rec'd 2nd unit of pRBCs and is tolerating it without any signs or symptoms of adverse reaction. Will continue to monitor.

## 2011-07-05 NOTE — Progress Notes (Signed)
Blood start, vitals in blood admin and vital sign tab under doc flowsheets. Will monitor throughout while receiving blood.

## 2011-07-05 NOTE — Progress Notes (Signed)
Pt transfer per MD order. VSS. One more unit of blood to give RN on 4700 informed. All meds current to time.  Wound Vac working properly at this time.

## 2011-07-05 NOTE — Consult Note (Signed)
WOC consult Note Reason for Consult: Wound consult requested for non-functioning vac dressing.  Right stump with postop full thickness wound.  Vac machine has been alarming all night.  Ortho service not available at present, requested to troubleshoot vac dressing. 10X13cm sponge covered with large amt clotted blood adhered to sponge.  Staples intact to vac sponge, so unable to remove to assess wound bed.  No active bleeding at present.  Area painful to touch.  Called ortho service to describe appearance and assess for further plan of care. This is the first post-op dressing change.  Ortho service plans to come and assess site and recommend further plan of care.  Moist gauze applied with kerlex and ace wrap. Will not plan to follow further unless re-consulted.  [address], RN, MSN, [employer]  [phone]

## 2011-07-05 NOTE — Progress Notes (Signed)
[facility] PROGRESS NOTE  [name] NFA:[id number] DOB: [birth year] DOA: 07/01/2011 PCP: Cheryl Saupe, MD  Assessment/Plan:  Diabetic foot ulcer associated with type 2 diabetes mellitus status post BKA Patient with diabetes foot/gangrene she is status post below-knee amputation and doing well. She is on antibiotic. Followed per orthopedics.  Patient for surgery tomorrow.   Diabetes mellitus Good control. Continue sliding scale insulin  and add Lantus.   Acute renal failure  her renal failure has resolved. Creatinine is normal.   Hypertension Patient with history of hypertension. Blood pressure is increasing. Start her on Lotensin for renal protection with her diabetes.  Anemia Hemoglobin is trending down. Will transfuse 2 units  Of PRBC since she is going to the OR tomorrow. Discussed with patient.  Code Status: Full  Family Communication: None Disposition Plan: When medically stable  Molli Posey, MD  [facility] Pager [phone]  If 8PM-8AM, please contact night-coverage [email or web address] Password [email or web address] 07/04/2011, 6:10 PM   LOS: 3 days   Brief narrative: Patient is a 46 yo diabetic patient with diabetic foot ulcer and right foot amputation on 11/24/2010. Presents to [facility] ED with pain, swelling, purulent malodorous discharge from the stump. Hypotensive, tachycardia and febrile upon presentation. Patient was initially on CCM service. Patient was transferred to [facility] on June 30.     Consultants:  Orthopedic  Procedures: Below-the-knee amputation  Antibiotics:  Zosyn and vancomycin ?  HPI/Subjective: Patient is without any complaints today.  She is sitting up in bed.   Objective: Filed Vitals:   07/04/11 0800 07/04/11 0905 07/04/11 1300 07/04/11 1708  BP: 136/69 135/67 130/62 161/61  Pulse: 88 95 100 110  Temp: 99.2 F (37.3 C) 98.8 F (37.1 C) 102 F (38.9 C) 98.8 F (37.1 C)  TempSrc: Oral Oral Oral Oral  Resp: 18  23  19   Height:      Weight:      SpO2: 98% 96% 93% 89%    Intake/Output Summary (Last 24 hours) at 07/04/11 1810 Last data filed at 07/04/11 1600  Gross per 24 hour  Intake   2225 ml  Output   1550 ml  Net    675 ml    Exam:   General: Patient is sitting up in bed without any complaints.   Cardiovascular: Regular rate and rhythm  Respiratory: Clear to auscultations bilaterally  Extremities: Left lower extremity wrap dressing and right lower extremity status post BKA  Data Reviewed: Basic Metabolic Panel:  Lab 07/04/11 2841 07/03/11 0350 07/02/11 0430 07/01/11 2354 07/01/11 2237  NA 135 137 135 133* 129*  K 3.7 4.3 -- -- --  CL 101 103 100 101 92*  CO2 23 26 21  -- 19  GLUCOSE 103* 164* 185* 203* 227*  BUN 8 11 15 16 17   CREATININE 1.04 1.06 1.16* 1.60* 1.36*  CALCIUM 8.1* 7.9* 8.1* -- 8.8  MG 2.0 -- 1.7 -- --  PHOS 3.6 -- 3.0 -- --   Liver Function Tests:  Lab 07/03/11 0350 07/01/11 2237  AST 20 30  ALT 12 16  ALKPHOS 66 96  BILITOT 0.2* 0.3  PROT 6.5 8.8*  ALBUMIN 2.1* 3.0*    CBC:  Lab 07/04/11 0937 07/04/11 0420 07/03/11 1600 07/03/11 0350 07/02/11 1602  WBC 6.6 5.4 6.6 6.5 5.4  NEUTROABS -- -- -- -- --  HGB 7.9* 6.6* 7.1* 7.2* 10.2*  HCT 24.3* 20.4* 21.8* 22.3* 31.5*  MCV 84.7 84.6 84.5 83.8 83.3  PLT 231 194 209 191 155  CBG:  Lab 07/04/11 1707 07/04/11 1157 07/04/11 0732 07/03/11 2132 07/03/11 1815  GLUCAP 122* 112* 100* 91 130*    Recent Results (from the past 240 hour(s))  CULTURE, BLOOD (ROUTINE X 2)     Status: Normal (Preliminary result)   Collection Time   07/01/11 10:15 PM      Component Value Range Status Comment   Specimen Description BLOOD LEFT ARM   Final    Special Requests BOTTLES DRAWN AEROBIC AND ANAEROBIC 5CC   Final    Culture  Setup Time 07/02/2011 11:15   Final    Culture     Final    Value:        BLOOD CULTURE RECEIVED NO GROWTH TO DATE CULTURE WILL BE HELD FOR 5 DAYS BEFORE ISSUING A FINAL NEGATIVE REPORT   Report  Status PENDING   Incomplete   CULTURE, BLOOD (ROUTINE X 2)     Status: Normal (Preliminary result)   Collection Time   07/01/11 10:30 PM      Component Value Range Status Comment   Specimen Description BLOOD LEFT HAND   Final    Special Requests BOTTLES DRAWN AEROBIC AND ANAEROBIC 5CC   Final    Culture  Setup Time 07/02/2011 11:15   Final    Culture     Final    Value:        BLOOD CULTURE RECEIVED NO GROWTH TO DATE CULTURE WILL BE HELD FOR 5 DAYS BEFORE ISSUING A FINAL NEGATIVE REPORT   Report Status PENDING   Incomplete   MRSA PCR SCREENING     Status: Normal   Collection Time   07/02/11  3:00 AM      Component Value Range Status Comment   MRSA by PCR NEGATIVE  NEGATIVE Final      Studies: Dg Chest Port 1 View  07/01/2011  *RADIOLOGY REPORT*  Clinical Data: 46 year old female with fever chest pain increased heart rate.  PORTABLE CHEST - 1 VIEW  Comparison: 10/24/2010.  Findings: Portable semi upright AP view 2327 hours.  Low lung volumes similar the prior study.  Cardiomegaly is difficult to exclude. Other mediastinal contours are within normal limits. Visualized tracheal air column is within normal limits.  No pneumothorax or large pleural effusion.  Pulmonary vascular congestion without overt edema.  Patchy basilar opacity, slightly greater on the left.  IMPRESSION: Low lung volumes with patchy bibasilar opacity which could reflect atelectasis or pneumonia.  PA and lateral radiographs recommended when possible.  Original Report Authenticated By: Harley Hallmark, M.D.   Dg Foot 2 Views Right  07/02/2011  *RADIOLOGY REPORT*  Clinical Data: Right foot wound infection with fever and swelling. History of diabetes.  RIGHT FOOT - 2 VIEW  Comparison: 11/22/2010  Findings: Interval trans tarsal amputation of the right foot. Soft tissue defects at the stump of the amputation.  This could represent irregularities associated with the wound or new ulceration.  There is amorphous subcutaneous gas in the  soft tissues along the medial aspect of the midfoot at the level of the navicular.  This is consistent with soft tissue infection with gas forming organism.  Visualized bones appear grossly intact.  There is no obvious bone sclerosis, periosteal reaction, or bone destruction to suggest osteomyelitis.  If clinically indicated, MRI would be more sensitive for detection of bone changes.  IMPRESSION: Interval trans tarsal amputation of the right foot with the residual or recurrent defect over the wound.  There is soft tissue gas medial to  the navicular consistent with infection with gas forming organism.  No obvious changes of osteomyelitis.  Original Report Authenticated By: Marlon Pel, M.D.    Scheduled Meds:   . insulin aspart  0-15 Units Subcutaneous TID WC  . insulin aspart  0-5 Units Subcutaneous QHS  . insulin aspart  4 Units Subcutaneous TID WC  . piperacillin-tazobactam (ZOSYN)  IV  3.375 g Intravenous Q8H  . vancomycin  1,000 mg Intravenous Q12H   Continuous Infusions:   . sodium chloride 50 mL/hr at 07/04/11 1210

## 2011-07-05 NOTE — Progress Notes (Signed)
Dr. [name] by to see pt and wound vac. Discussed findings that Dr. Thomasena Edis was made aware of this am.  Wound Vac sponge removed by Dr. [name] and a new wound vac sponge placed. Wound Vac turned on and is currently pulling drainage. Dr. [name] discussed need to keep wound vac on until surgery tomorrow around 1 pm. Will continue to monitor for further changes.

## 2011-07-05 NOTE — Progress Notes (Signed)
Wound vac removed large clot found with wound vac nurse at bedside, MD called and aware of findings and stated will be by around lunch.

## 2011-07-06 ENCOUNTER — Encounter (HOSPITAL_COMMUNITY): Payer: Self-pay | Admitting: Certified Registered"

## 2011-07-06 ENCOUNTER — Inpatient Hospital Stay (HOSPITAL_COMMUNITY): Payer: 59 | Admitting: Certified Registered"

## 2011-07-06 ENCOUNTER — Encounter (HOSPITAL_COMMUNITY)
Admission: EM | Disposition: A | Discharge status: Rehabilitation Facility | Payer: Self-pay | Source: Ambulatory Visit | Attending: Internal Medicine

## 2011-07-06 HISTORY — PX: I&D EXTREMITY: SHX5045

## 2011-07-06 LAB — CBC
HCT: 33.1 % — ABNORMAL LOW (ref 36.0–46.0)
Hemoglobin: 10.9 g/dL — ABNORMAL LOW (ref 12.0–15.0)
MCH: 27.7 pg (ref 26.0–34.0)
MCHC: 32.9 g/dL (ref 30.0–36.0)
MCV: 84.2 fL (ref 78.0–100.0)
Platelets: 323 10*3/uL (ref 150–400)
RBC: 3.93 MIL/uL (ref 3.87–5.11)
RDW: 15.5 % (ref 11.5–15.5)
WBC: 7.1 10*3/uL (ref 4.0–10.5)

## 2011-07-06 LAB — TYPE AND SCREEN
ABO/RH(D): B POS
Antibody Screen: NEGATIVE
Unit division: 0
Unit division: 0
Unit division: 0

## 2011-07-06 LAB — BASIC METABOLIC PANEL
BUN: 6 mg/dL (ref 6–23)
CO2: 26 mEq/L (ref 19–32)
Calcium: 8.9 mg/dL (ref 8.4–10.5)
Chloride: 101 mEq/L (ref 96–112)
Creatinine, Ser: 0.93 mg/dL (ref 0.50–1.10)
GFR calc Af Amer: 85 mL/min — ABNORMAL LOW (ref >90)
GFR calc non Af Amer: 74 mL/min — ABNORMAL LOW (ref >90)
Glucose, Bld: 120 mg/dL — ABNORMAL HIGH (ref 70–99)
Potassium: 4.3 mEq/L (ref 3.5–5.1)
Sodium: 139 mEq/L (ref 135–145)

## 2011-07-06 LAB — GLUCOSE, CAPILLARY
Glucose-Capillary: 120 mg/dL — ABNORMAL HIGH (ref 70–99)
Glucose-Capillary: 140 mg/dL — ABNORMAL HIGH (ref 70–99)
Glucose-Capillary: 137 mg/dL — ABNORMAL HIGH (ref 70–99)
Glucose-Capillary: 95 mg/dL (ref 70–99)

## 2011-07-06 LAB — VANCOMYCIN, TROUGH: Vancomycin Tr: 14.9 ug/mL (ref 10.0–20.0)

## 2011-07-06 SURGERY — IRRIGATION AND DEBRIDEMENT EXTREMITY
Anesthesia: General | Site: Leg Lower | Laterality: Right | Wound class: Dirty or Infected

## 2011-07-06 MED ORDER — PROPOFOL 10 MG/ML IV EMUL
INTRAVENOUS | Status: DC | PRN
  Administered 2011-07-06: 150 mg via INTRAVENOUS
  Administered 2011-07-06: 10 mg via INTRAVENOUS

## 2011-07-06 MED ORDER — FENTANYL CITRATE 0.05 MG/ML IJ SOLN
INTRAMUSCULAR | Status: DC | PRN
  Administered 2011-07-06 (×8): 25 ug via INTRAVENOUS
  Administered 2011-07-06: 50 ug via INTRAVENOUS
  Administered 2011-07-06 (×2): 25 ug via INTRAVENOUS
  Administered 2011-07-06: 50 ug via INTRAVENOUS
  Administered 2011-07-06 (×2): 25 ug via INTRAVENOUS

## 2011-07-06 MED ORDER — LIDOCAINE HCL (CARDIAC) 20 MG/ML IV SOLN
INTRAVENOUS | Status: DC | PRN
  Administered 2011-07-06: 80 mg via INTRAVENOUS

## 2011-07-06 MED ORDER — SODIUM CHLORIDE 0.9 % IR SOLN
Status: DC | PRN
  Administered 2011-07-06: 1000 mL

## 2011-07-06 MED ORDER — ENOXAPARIN SODIUM 40 MG/0.4ML ~~LOC~~ SOLN
40.0000 mg | SUBCUTANEOUS | Status: DC
Start: 2011-07-07 — End: 2011-07-09
  Administered 2011-07-07 – 2011-07-09 (×3): 40 mg via SUBCUTANEOUS
  Filled 2011-07-06 (×5): qty 0.4

## 2011-07-06 MED ORDER — CHLORHEXIDINE GLUCONATE 4 % EX LIQD
60.0000 mL | Freq: Once | CUTANEOUS | Status: AC
  Administered 2011-07-06: 4 via TOPICAL
  Filled 2011-07-06: qty 60

## 2011-07-06 MED ORDER — LACTATED RINGERS IV SOLN
INTRAVENOUS | Status: DC | PRN
  Administered 2011-07-06 (×2): via INTRAVENOUS

## 2011-07-06 MED ORDER — DROPERIDOL 2.5 MG/ML IJ SOLN: 0.6250 mg | INTRAMUSCULAR | Status: DC | PRN

## 2011-07-06 MED ORDER — HYDROMORPHONE HCL PF 1 MG/ML IJ SOLN
INTRAMUSCULAR | Status: AC
  Filled 2011-07-06: qty 1

## 2011-07-06 MED ORDER — MIDAZOLAM HCL 5 MG/5ML IJ SOLN
INTRAMUSCULAR | Status: DC | PRN
  Administered 2011-07-06: 2 mg via INTRAVENOUS

## 2011-07-06 MED ORDER — HYDRALAZINE HCL 20 MG/ML IJ SOLN
5.0000 mg | INTRAMUSCULAR | Status: DC | PRN
  Filled 2011-07-06 (×2): qty 0.25

## 2011-07-06 MED ORDER — LACTATED RINGERS IV SOLN
INTRAVENOUS | Status: DC
  Administered 2011-07-06: 13:00:00 via INTRAVENOUS

## 2011-07-06 MED ORDER — HYDROMORPHONE HCL PF 1 MG/ML IJ SOLN
0.2500 mg | INTRAMUSCULAR | Status: DC | PRN
  Administered 2011-07-06 (×3): 0.5 mg via INTRAVENOUS

## 2011-07-06 MED ORDER — CHLORHEXIDINE GLUCONATE 4 % EX LIQD
60.0000 mL | Freq: Once | CUTANEOUS | Status: DC
  Filled 2011-07-06: qty 60

## 2011-07-06 MED ORDER — OXYCODONE HCL 5 MG PO TABS
10.0000 mg | ORAL_TABLET | ORAL | Status: DC | PRN
  Administered 2011-07-06: 15 mg via ORAL
  Administered 2011-07-07: 10 mg via ORAL
  Administered 2011-07-07: 15 mg via ORAL
  Administered 2011-07-07 (×2): 10 mg via ORAL
  Administered 2011-07-08 – 2011-07-09 (×4): 15 mg via ORAL
  Filled 2011-07-06 (×4): qty 3
  Filled 2011-07-06 (×3): qty 2
  Filled 2011-07-06 (×2): qty 3

## 2011-07-06 MED ORDER — SODIUM CHLORIDE 0.9 % IV SOLN
INTRAVENOUS | Status: DC
  Administered 2011-07-06: 12:00:00 via INTRAVENOUS

## 2011-07-06 MED ORDER — ONDANSETRON HCL 4 MG/2ML IJ SOLN
INTRAMUSCULAR | Status: DC | PRN
  Administered 2011-07-06: 4 mg via INTRAVENOUS

## 2011-07-06 SURGICAL SUPPLY — 56 items
BANDAGE ELASTIC 6 VELCRO ST LF (GAUZE/BANDAGES/DRESSINGS) ×2 IMPLANT
BANDAGE ESMARK 6X9 LF (GAUZE/BANDAGES/DRESSINGS) ×1 IMPLANT
BLADE SURG 10 STRL SS (BLADE) ×2 IMPLANT
BNDG COHESIVE 4X5 TAN STRL (GAUZE/BANDAGES/DRESSINGS) IMPLANT
BNDG COHESIVE 6X5 TAN STRL LF (GAUZE/BANDAGES/DRESSINGS) IMPLANT
BNDG ESMARK 6X9 LF (GAUZE/BANDAGES/DRESSINGS) ×2
CANISTER WOUND CARE 500ML ATS (WOUND CARE) ×2 IMPLANT
CHLORAPREP W/TINT 26ML (MISCELLANEOUS) ×2 IMPLANT
CLOTH BEACON ORANGE TIMEOUT ST (SAFETY) ×2 IMPLANT
COVER SURGICAL LIGHT HANDLE (MISCELLANEOUS) ×2 IMPLANT
CUFF TOURNIQUET SINGLE 34IN LL (TOURNIQUET CUFF) ×2 IMPLANT
CUFF TOURNIQUET SINGLE 44IN (TOURNIQUET CUFF) IMPLANT
DRAPE U-SHAPE 47X51 STRL (DRAPES) ×2 IMPLANT
DRSG MEPITEL 4X7.2 (GAUZE/BANDAGES/DRESSINGS) ×2 IMPLANT
DRSG PAD ABDOMINAL 8X10 ST (GAUZE/BANDAGES/DRESSINGS) IMPLANT
DRSG VAC ATS MED SENSATRAC (GAUZE/BANDAGES/DRESSINGS) ×2 IMPLANT
ELECT REM PT RETURN 9FT ADLT (ELECTROSURGICAL) ×2
ELECTRODE REM PT RTRN 9FT ADLT (ELECTROSURGICAL) ×1 IMPLANT
GAUZE XEROFORM 5X9 LF (GAUZE/BANDAGES/DRESSINGS) IMPLANT
GLOVE BIO SURGEON STRL SZ8 (GLOVE) ×2 IMPLANT
GLOVE BIOGEL PI IND STRL 8 (GLOVE) ×1 IMPLANT
GLOVE BIOGEL PI INDICATOR 8 (GLOVE) ×1
GLOVE ECLIPSE 8.5 STRL (GLOVE) ×2 IMPLANT
GLOVE SURG SS PI 8.5 STRL IVOR (GLOVE) ×1
GLOVE SURG SS PI 8.5 STRL STRW (GLOVE) ×1 IMPLANT
GOWN PREVENTION PLUS XLARGE (GOWN DISPOSABLE) ×2 IMPLANT
GOWN PREVENTION PLUS XXLARGE (GOWN DISPOSABLE) ×2 IMPLANT
GOWN STRL NON-REIN LRG LVL3 (GOWN DISPOSABLE) IMPLANT
KIT BASIN OR (CUSTOM PROCEDURE TRAY) ×2 IMPLANT
KIT ROOM TURNOVER OR (KITS) ×2 IMPLANT
MANIFOLD NEPTUNE II (INSTRUMENTS) ×2 IMPLANT
NS IRRIG 1000ML POUR BTL (IV SOLUTION) ×2 IMPLANT
PACK ORTHO EXTREMITY (CUSTOM PROCEDURE TRAY) ×2 IMPLANT
PAD ARMBOARD 7.5X6 YLW CONV (MISCELLANEOUS) ×4 IMPLANT
PAD CAST 4YDX4 CTTN HI CHSV (CAST SUPPLIES) IMPLANT
PADDING CAST ABS 6INX4YD NS (CAST SUPPLIES) ×1
PADDING CAST ABS COTTON 6X4 NS (CAST SUPPLIES) ×1 IMPLANT
PADDING CAST COTTON 4X4 STRL (CAST SUPPLIES)
SOAP 2 % CHG 4 OZ (WOUND CARE) IMPLANT
SPONGE GAUZE 4X4 12PLY (GAUZE/BANDAGES/DRESSINGS) IMPLANT
STAPLER VISISTAT 35W (STAPLE) IMPLANT
SUCTION FRAZIER TIP 10 FR DISP (SUCTIONS) ×2 IMPLANT
SUT ETHILON 2 0 PSLX (SUTURE) ×2 IMPLANT
SUT ETHILON 3 0 FSL (SUTURE) IMPLANT
SUT MNCRL AB 3-0 PS2 18 (SUTURE) ×6 IMPLANT
SUT PDS II 0 TP-1 LOOPED 60 (SUTURE) ×2 IMPLANT
SUT PROLENE 3 0 PS 2 (SUTURE) IMPLANT
SUT VIC AB 2-0 CT1 27 (SUTURE)
SUT VIC AB 2-0 CT1 TAPERPNT 27 (SUTURE) IMPLANT
SUT VIC AB 3-0 PS2 18 (SUTURE)
SUT VIC AB 3-0 PS2 18XBRD (SUTURE) IMPLANT
TOWEL OR 17X24 6PK STRL BLUE (TOWEL DISPOSABLE) ×2 IMPLANT
TOWEL OR 17X26 10 PK STRL BLUE (TOWEL DISPOSABLE) ×2 IMPLANT
TUBE CONNECTING 12X1/4 (SUCTIONS) ×2 IMPLANT
WATER STERILE IRR 1000ML POUR (IV SOLUTION) IMPLANT
YANKAUER SUCT BULB TIP NO VENT (SUCTIONS) ×2 IMPLANT

## 2011-07-06 NOTE — Progress Notes (Signed)
Patient returned from OR back to room 4729. VSS, pt complained of pain from surgical site ( Right stump area) Pain med given per prn order. Pt used bedpan upon returning. CBG was taken and insulin given per sliding scale order. Patient repositioned x3 in patient bed. Will continue to monitor.

## 2011-07-06 NOTE — Interval H&P Note (Signed)
History and Physical Interval Note:  07/06/2011 10:57 AM  [name]  has presented today for surgery, with the diagnosis of STATUS POST BELOW KNEE AMPUTATION ON RIGHT SIDE with open wound.  The various methods of treatment have been discussed with the patient and family. After consideration of risks, benefits and other options for treatment, the patient has consented to  Procedure(s) (LRB): IRRIGATION AND DEBRIDEMENT EXTREMITY (Right) BK amputation site, possible revision amputation and possible application of wound VAC as a surgical intervention .  The patient's history has been reviewed, patient examined, no change in status, stable for surgery.  I have reviewed the patients' chart and labs.  Questions were answered to the patient's satisfaction.     [name]

## 2011-07-06 NOTE — Progress Notes (Signed)
ANTIBIOTIC CONSULT NOTE - FOLLOW UP  Pharmacy Consult for Vancomycin and Zosyn Indication: Infected foot  No Known Allergies  Patient Measurements: Height: 5\' 7"  (170.2 cm) Weight: 224 lb 10.4 oz (101.9 kg) IBW/kg (Calculated) : 61.6  Adjusted Body Weight:   Vital Signs: Temp: 97.2 F (36.2 C) (07/02 1210) Temp src: Oral (07/02 1210) BP: 163/73 mmHg (07/02 1210) Pulse Rate: 96  (07/02 1210) Intake/Output from previous day: 07/01 0701 - 07/02 0700 In: 287.5 [I.V.:25; Blood:12.5; IV Piggyback:250] Out: 2225 [Urine:2100; Emesis/NG output:75; Drains:50] Intake/Output from this shift: Total I/O In: -  Out: 325 [Urine:325]  Labs:  [facility] 07/06/11 0520 07/05/11 0420 07/04/11 0937 07/04/11 0420  WBC 7.1 5.8 6.6 --  HGB 10.9* 7.3* 7.9* --  PLT 323 236 231 --  LABCREA -- -- -- --  CREATININE 0.93 0.94 -- 1.04   Estimated Creatinine Clearance: 94.7 ml/min (by C-G formula based on Cr of 0.93).  Basename 07/06/11 1000  VANCOTROUGH 14.9  VANCOPEAK --  VANCORANDOM --  GENTTROUGH --  GENTPEAK --  GENTRANDOM --  TOBRATROUGH --  TOBRAPEAK --  TOBRARND --  AMIKACINPEAK --  AMIKACINTROU --  AMIKACIN --     Microbiology: Recent Results (from the past 720 hour(s))  CULTURE, BLOOD (ROUTINE X 2)     Status: Normal (Preliminary result)   Collection Time   07/01/11 10:15 PM      Component Value Range Status Comment   Specimen Description BLOOD LEFT ARM   Final    Special Requests BOTTLES DRAWN AEROBIC AND ANAEROBIC 5CC   Final    Culture  Setup Time 07/02/2011 11:15   Final    Culture     Final    Value:        BLOOD CULTURE RECEIVED NO GROWTH TO DATE CULTURE WILL BE HELD FOR 5 DAYS BEFORE ISSUING A FINAL NEGATIVE REPORT   Report Status PENDING   Incomplete   CULTURE, BLOOD (ROUTINE X 2)     Status: Normal (Preliminary result)   Collection Time   07/01/11 10:30 PM      Component Value Range Status Comment   Specimen Description BLOOD LEFT HAND   Final    Special Requests  BOTTLES DRAWN AEROBIC AND ANAEROBIC 5CC   Final    Culture  Setup Time 07/02/2011 11:15   Final    Culture     Final    Value:        BLOOD CULTURE RECEIVED NO GROWTH TO DATE CULTURE WILL BE HELD FOR 5 DAYS BEFORE ISSUING A FINAL NEGATIVE REPORT   Report Status PENDING   Incomplete   MRSA PCR SCREENING     Status: Normal   Collection Time   07/02/11  3:00 AM      Component Value Range Status Comment   MRSA by PCR NEGATIVE  NEGATIVE Final     Anti-infectives     Start     Dose/Rate Route Frequency Ordered Stop   07/06/11 2000   piperacillin-tazobactam (ZOSYN) IVPB 3.375 g        3.375 g 12.5 mL/hr over 240 Minutes Intravenous Every 8 hours 07/05/11 1623     07/02/11 1400   clindamycin (CLEOCIN) IVPB 900 mg  Status:  Discontinued        900 mg 100 mL/hr over 30 Minutes Intravenous 3 times per day 07/02/11 0824 07/02/11 1046   07/02/11 1000   vancomycin (VANCOCIN) IVPB 1000 mg/200 mL premix        1,000 mg  200 mL/hr over 60 Minutes Intravenous Every 12 hours 07/02/11 0104     07/02/11 0800   piperacillin-tazobactam (ZOSYN) IVPB 3.375 g  Status:  Discontinued        3.375 g 12.5 mL/hr over 240 Minutes Intravenous Every 8 hours 07/02/11 0104 07/05/11 1623   07/02/11 0100   clindamycin (CLEOCIN) IVPB 900 mg  Status:  Discontinued        900 mg 100 mL/hr over 30 Minutes Intravenous 4 times per day 07/02/11 0037 07/02/11 0824   07/02/11 0045   clindamycin (CLEOCIN) IVPB 600 mg  Status:  Discontinued        600 mg 100 mL/hr over 30 Minutes Intravenous  Once 07/02/11 0030 07/02/11 0037   07/02/11 0015   vancomycin (VANCOCIN) IVPB 1000 mg/200 mL premix        1,000 mg 200 mL/hr over 60 Minutes Intravenous  Once 07/02/11 0000 07/02/11 0231   07/02/11 0015   piperacillin-tazobactam (ZOSYN) IVPB 3.375 g        3.375 g 100 mL/hr over 30 Minutes Intravenous  Once 07/02/11 0000 07/02/11 0123          Assessment: Pt had surgery for BKA for infected foot and sepsis on 6/28. Pt has  been on vanc/zosyn since 6/28. Blood cultures have been neg to date. Pt returns to the OR today for I&D. Vanc trough today = 14.9. Within goal.     Goal of Therapy:  Vancomycin trough level 15-20 mcg/ml  Plan:  Cont vanc 1g IV q12 Cont zosyn 3.375g IV q8  [name] 07/06/2011,1:05 PM

## 2011-07-06 NOTE — Anesthesia Postprocedure Evaluation (Signed)
Anesthesia Post Note  Patient: [name]  Procedure(s) Performed: Procedure(s) (LRB): IRRIGATION AND DEBRIDEMENT EXTREMITY (Right)  Anesthesia type: general  Patient location: PACU  Post pain: Pain level controlled  Post assessment: Patient's Cardiovascular Status Stable  Last Vitals:  Filed Vitals:   07/06/11 1700  BP: 152/60  Pulse:   Temp:   Resp:     Post vital signs: Reviewed and stable  Level of consciousness: sedated  Complications: No apparent anesthesia complications

## 2011-07-06 NOTE — Progress Notes (Signed)
[facility] PROGRESS NOTE  GRAE LEATHERS ZOX:[id number] DOB: 02/04/1965 DOA: 07/01/2011 PCP: [name], MD  Brief narrative: Patient is a 46 yo diabetic patient with diabetic foot ulcer and right foot amputation on 11/24/2010. Presents to [facility] ED with pain, swelling, purulent malodorous discharge from the stump. Hypotensive, tachycardia and febrile upon presentation. Patient was initially on CCM service. Patient was transferred to [facility] on June 30.   Patient was transfused 2 units packed red blood cells on July 1 secondary to hemoglobin of 7.3 ; Patient was taken back to the OR on July 2 per orthopedics.    Assessment/Plan:  Diabetic foot ulcer associated with type 2 diabetes mellitus status post BKA Patient with diabetes foot/gangrene she is status post below-knee amputation and doing well. She is on antibiotic. Followed per orthopedics.  Had repeat surgery today.   Diabetes mellitus Good control. Continue sliding scale insulin  and add Lantus.   Acute renal failure  her renal failure has resolved. Creatinine is normal.   Hypertension Patient with history of hypertension. Blood pressure is increasing. Start her on Lotensin for renal protection with her diabetes.  Anemia Hemoglobin is trending down. Patient was transfused  2 units of PRBC  Code Status: Full  Family Communication: None Disposition Plan: When medically stable  Molli Posey, MD  [facility] Pager [phone]  If 8PM-8AM, please contact night-coverage [email or web address] Password [email or web address] 07/04/2011, 6:10 PM   LOS: 3 days   Consultants:  Orthopedic  Procedures: Below-the-knee amputation  Antibiotics:  Zosyn and vancomycin ?  HPI/Subjective: Patient is without any complaints today.  She is sitting up in bed.   Objective: Filed Vitals:   07/04/11 0800 07/04/11 0905 07/04/11 1300 07/04/11 1708  BP: 136/69 135/67 130/62 161/61  Pulse: 88 95 100 110  Temp: 99.2 F (37.3 C)  98.8 F (37.1 C) 102 F (38.9 C) 98.8 F (37.1 C)  TempSrc: Oral Oral Oral Oral  Resp: 18  23 19   Height:      Weight:      SpO2: 98% 96% 93% 89%    Intake/Output Summary (Last 24 hours) at 07/04/11 1810 Last data filed at 07/04/11 1600  Gross per 24 hour  Intake   2225 ml  Output   1550 ml  Net    675 ml    Exam:   General: Patient is sitting up in bed without any complaints.   Cardiovascular: Regular rate and rhythm  Respiratory: Clear to auscultations bilaterally  Extremities: Left lower extremity wrap dressing and right lower extremity status post BKA  Data Reviewed: Basic Metabolic Panel:  Lab 07/04/11 8295 07/03/11 0350 07/02/11 0430 07/01/11 2354 07/01/11 2237  NA 135 137 135 133* 129*  K 3.7 4.3 -- -- --  CL 101 103 100 101 92*  CO2 23 26 21  -- 19  GLUCOSE 103* 164* 185* 203* 227*  BUN 8 11 15 16 17   CREATININE 1.04 1.06 1.16* 1.60* 1.36*  CALCIUM 8.1* 7.9* 8.1* -- 8.8  MG 2.0 -- 1.7 -- --  PHOS 3.6 -- 3.0 -- --   Liver Function Tests:  Lab 07/03/11 0350 07/01/11 2237  AST 20 30  ALT 12 16  ALKPHOS 66 96  BILITOT 0.2* 0.3  PROT 6.5 8.8*  ALBUMIN 2.1* 3.0*    CBC:  Lab 07/04/11 0937 07/04/11 0420 07/03/11 1600 07/03/11 0350 07/02/11 1602  WBC 6.6 5.4 6.6 6.5 5.4  NEUTROABS -- -- -- -- --  HGB 7.9* 6.6* 7.1* 7.2* 10.2*  HCT 24.3* 20.4* 21.8* 22.3* 31.5*  MCV 84.7 84.6 84.5 83.8 83.3  PLT 231 194 209 191 155    CBG:  Lab 07/04/11 1707 07/04/11 1157 07/04/11 0732 07/03/11 2132 07/03/11 1815  GLUCAP 122* 112* 100* 91 130*    Recent Results (from the past 240 hour(s))  CULTURE, BLOOD (ROUTINE X 2)     Status: Normal (Preliminary result)   Collection Time   07/01/11 10:15 PM      Component Value Range Status Comment   Specimen Description BLOOD LEFT ARM   Final    Special Requests BOTTLES DRAWN AEROBIC AND ANAEROBIC 5CC   Final    Culture  Setup Time 07/02/2011 11:15   Final    Culture     Final    Value:        BLOOD CULTURE RECEIVED  NO GROWTH TO DATE CULTURE WILL BE HELD FOR 5 DAYS BEFORE ISSUING A FINAL NEGATIVE REPORT   Report Status PENDING   Incomplete   CULTURE, BLOOD (ROUTINE X 2)     Status: Normal (Preliminary result)   Collection Time   07/01/11 10:30 PM      Component Value Range Status Comment   Specimen Description BLOOD LEFT HAND   Final    Special Requests BOTTLES DRAWN AEROBIC AND ANAEROBIC 5CC   Final    Culture  Setup Time 07/02/2011 11:15   Final    Culture     Final    Value:        BLOOD CULTURE RECEIVED NO GROWTH TO DATE CULTURE WILL BE HELD FOR 5 DAYS BEFORE ISSUING A FINAL NEGATIVE REPORT   Report Status PENDING   Incomplete   MRSA PCR SCREENING     Status: Normal   Collection Time   07/02/11  3:00 AM      Component Value Range Status Comment   MRSA by PCR NEGATIVE  NEGATIVE Final      Studies: Dg Chest Port 1 View  07/01/2011  *RADIOLOGY REPORT*  Clinical Data: 46 year old female with fever chest pain increased heart rate.  PORTABLE CHEST - 1 VIEW  Comparison: 10/24/2010.  Findings: Portable semi upright AP view 2327 hours.  Low lung volumes similar the prior study.  Cardiomegaly is difficult to exclude. Other mediastinal contours are within normal limits. Visualized tracheal air column is within normal limits.  No pneumothorax or large pleural effusion.  Pulmonary vascular congestion without overt edema.  Patchy basilar opacity, slightly greater on the left.  IMPRESSION: Low lung volumes with patchy bibasilar opacity which could reflect atelectasis or pneumonia.  PA and lateral radiographs recommended when possible.  Original Report Authenticated By: Harley Hallmark, M.D.   Dg Foot 2 Views Right  07/02/2011  *RADIOLOGY REPORT*  Clinical Data: Right foot wound infection with fever and swelling. History of diabetes.  RIGHT FOOT - 2 VIEW  Comparison: 11/22/2010  Findings: Interval trans tarsal amputation of the right foot. Soft tissue defects at the stump of the amputation.  This could represent  irregularities associated with the wound or new ulceration.  There is amorphous subcutaneous gas in the soft tissues along the medial aspect of the midfoot at the level of the navicular.  This is consistent with soft tissue infection with gas forming organism.  Visualized bones appear grossly intact.  There is no obvious bone sclerosis, periosteal reaction, or bone destruction to suggest osteomyelitis.  If clinically indicated, MRI would be more sensitive for detection of bone changes.  IMPRESSION: Interval trans  tarsal amputation of the right foot with the residual or recurrent defect over the wound.  There is soft tissue gas medial to the navicular consistent with infection with gas forming organism.  No obvious changes of osteomyelitis.  Original Report Authenticated By: Marlon Pel, M.D.    Scheduled Meds:   . insulin aspart  0-15 Units Subcutaneous TID WC  . insulin aspart  0-5 Units Subcutaneous QHS  . insulin aspart  4 Units Subcutaneous TID WC  . piperacillin-tazobactam (ZOSYN)  IV  3.375 g Intravenous Q8H  . vancomycin  1,000 mg Intravenous Q12H   Continuous Infusions:   . sodium chloride 50 mL/hr at 07/04/11 1210

## 2011-07-06 NOTE — Transfer of Care (Signed)
Immediate Anesthesia Transfer of Care Note  Patient: [name]  Procedure(s) Performed: Procedure(s) (LRB): IRRIGATION AND DEBRIDEMENT EXTREMITY (Right)  Patient Location: PACU  Anesthesia Type: General  Level of Consciousness: awake, oriented and patient cooperative  Airway & Oxygen Therapy: Patient Spontanous Breathing and Patient connected to nasal cannula oxygen  Post-op Assessment: Report given to PACU RN, Post -op Vital signs reviewed and stable and Patient moving all extremities X 4  Post vital signs: Reviewed and stable  Complications: No apparent anesthesia complications

## 2011-07-06 NOTE — Anesthesia Preprocedure Evaluation (Addendum)
Anesthesia Evaluation  Patient identified by MRN, date of birth, ID band Patient awake    Reviewed: Allergy & Precautions, H&P , NPO status , Patient's Chart, lab work & pertinent test results  History of Anesthesia Complications Negative for: history of anesthetic complications  Airway Mallampati: II TM Distance: >3 FB Neck ROM: Full    Dental  (+) Teeth Intact and Dental Advisory Given   Pulmonary neg pulmonary ROS,  breath sounds clear to auscultation  Pulmonary exam normal       Cardiovascular hypertension, Pt. on medications Rhythm:Regular Rate:Normal     Neuro/Psych negative neurological ROS     GI/Hepatic negative GI ROS, Neg liver ROS,   Endo/Other  Diabetes mellitus-, Insulin Dependent  Renal/GU negative Renal ROS     Musculoskeletal   Abdominal   Peds  Hematology   Anesthesia Other Findings   Reproductive/Obstetrics                           Anesthesia Physical Anesthesia Plan  ASA: III  Anesthesia Plan: General   Post-op Pain Management:    Induction: Intravenous  Airway Management Planned: Oral ETT  Additional Equipment:   Intra-op Plan:   Post-operative Plan: Extubation in OR  Informed Consent: I have reviewed the patients History and Physical, chart, labs and discussed the procedure including the risks, benefits and alternatives for the proposed anesthesia with the patient or authorized representative who has indicated his/her understanding and acceptance.   Dental advisory given  Plan Discussed with: CRNA, Anesthesiologist and Surgeon  Anesthesia Plan Comments:         Anesthesia Quick Evaluation

## 2011-07-06 NOTE — Brief Op Note (Signed)
07/01/2011 - 07/06/2011  3:30 PM  PATIENT:  [name]  46 y.o. female  PRE-OPERATIVE DIAGNOSIS:   Open right leg wound s/p R BKA  POST-OPERATIVE DIAGNOSIS:   same  Procedure(s): 1.  Irrigation and excisional debridement right leg wound 15 cm x 10 cm x 2 cm 2.  Revision right below knee amputation 3.  Application of wound VAC to right leg incision 18 cm  SURGEON:  [name], MD  ASSISTANT: n/a  ANESTHESIA:   General  EBL:  minimal   TOURNIQUET:   Total Tourniquet Time Documented: Thigh (Right) - 40 minutes  COMPLICATIONS:  None apparent  DISPOSITION:  Extubated, awake and stable to recovery.  DICTATION ID:  [id number]

## 2011-07-07 ENCOUNTER — Inpatient Hospital Stay (HOSPITAL_COMMUNITY): Payer: 59

## 2011-07-07 ENCOUNTER — Encounter (HOSPITAL_COMMUNITY): Payer: Self-pay | Admitting: Orthopedic Surgery

## 2011-07-07 DIAGNOSIS — M129 Arthropathy, unspecified: Secondary | ICD-10-CM

## 2011-07-07 LAB — BASIC METABOLIC PANEL
BUN: 5 mg/dL — ABNORMAL LOW (ref 6–23)
CO2: 27 mEq/L (ref 19–32)
Calcium: 9.3 mg/dL (ref 8.4–10.5)
Chloride: 99 mEq/L (ref 96–112)
Creatinine, Ser: 0.95 mg/dL (ref 0.50–1.10)
GFR calc Af Amer: 83 mL/min — ABNORMAL LOW (ref >90)
GFR calc non Af Amer: 72 mL/min — ABNORMAL LOW (ref >90)
Glucose, Bld: 152 mg/dL — ABNORMAL HIGH (ref 70–99)
Potassium: 3.7 mEq/L (ref 3.5–5.1)
Sodium: 136 mEq/L (ref 135–145)

## 2011-07-07 LAB — CBC
HCT: 34.6 % — ABNORMAL LOW (ref 36.0–46.0)
Hemoglobin: 11.2 g/dL — ABNORMAL LOW (ref 12.0–15.0)
MCH: 27.9 pg (ref 26.0–34.0)
MCHC: 32.4 g/dL (ref 30.0–36.0)
MCV: 86.3 fL (ref 78.0–100.0)
Platelets: 351 10*3/uL (ref 150–400)
RBC: 4.01 MIL/uL (ref 3.87–5.11)
RDW: 15.9 % — ABNORMAL HIGH (ref 11.5–15.5)
WBC: 9 10*3/uL (ref 4.0–10.5)

## 2011-07-07 LAB — GLUCOSE, CAPILLARY
Glucose-Capillary: 123 mg/dL — ABNORMAL HIGH (ref 70–99)
Glucose-Capillary: 127 mg/dL — ABNORMAL HIGH (ref 70–99)
Glucose-Capillary: 166 mg/dL — ABNORMAL HIGH (ref 70–99)
Glucose-Capillary: 157 mg/dL — ABNORMAL HIGH (ref 70–99)
Glucose-Capillary: 139 mg/dL — ABNORMAL HIGH (ref 70–99)

## 2011-07-07 MED ORDER — FENTANYL 25 MCG/HR TD PT72
25.0000 ug | MEDICATED_PATCH | TRANSDERMAL | Status: DC
  Administered 2011-07-07: 25 ug via TRANSDERMAL
  Filled 2011-07-07: qty 1

## 2011-07-07 MED ORDER — HYDROMORPHONE HCL PF 1 MG/ML IJ SOLN: 1.0000 mg | INTRAMUSCULAR | Status: DC | PRN

## 2011-07-07 MED ORDER — HYDROCODONE-ACETAMINOPHEN 5-325 MG PO TABS: 1.0000 | ORAL_TABLET | Freq: Four times a day (QID) | ORAL | Status: DC | PRN

## 2011-07-07 NOTE — Progress Notes (Signed)
Subjective: 1 Day Post-Op Procedure(s) (LRB): IRRIGATION AND DEBRIDEMENT EXTREMITY (Right) Patient reports pain as mild.  No n/v/f/c.  Objective: Vital signs in last 24 hours: Temp:  [98 F (36.7 C)-102.3 F (39.1 C)] 99 F (37.2 C) (07/03 1725) Pulse Rate:  [93-106] 93  (07/03 1725) Resp:  [20] 20  (07/03 1725) BP: (150-185)/(60-98) 150/73 mmHg (07/03 1725) SpO2:  [91 %-99 %] 91 % (07/03 1725) Weight:  [99.7 kg (219 lb 12.8 oz)] 99.7 kg (219 lb 12.8 oz) (07/03 0646)  Intake/Output from previous day: 07/02 0701 - 07/03 0700 In: 1000 [I.V.:1000] Out: 1100 [Urine:1100] Intake/Output this shift: Total I/O In: 480 [P.O.:480] Out: 900 [Urine:900]   Basename 07/07/11 0650 07/06/11 0520 07/05/11 0420  HGB 11.2* 10.9* 7.3*    Basename 07/07/11 0650 07/06/11 0520  WBC 9.0 7.1  RBC 4.01 3.93  HCT 34.6* 33.1*  PLT 351 323    Basename 07/07/11 0650 07/06/11 0520  NA 136 139  K 3.7 4.3  CL 99 101  CO2 27 26  BUN 5* 6  CREATININE 0.95 0.93  GLUCOSE 152* 120*  CALCIUM 9.3 8.9   No results found for this basename: LABPT:2,INR:2 in the last 72 hours  R LE immobilized in knee immobilizer.  Wound drssed and dry with WV in place.  No drainage in vac canister.  Assessment/Plan: 1 Day Post-Op Procedure(s) (LRB): IRRIGATION AND DEBRIDEMENT EXTREMITY (Right) On POD 1 a fever is unlikely to reflect infection at the wound.  I plan to remove the wound VAC on Friday.  I recommend observing for further fever and continuing IV abx for now.  Covering MD will check on her tomorrow, and I'll round again on Friday.  [name] 07/07/2011, 6:27 PM

## 2011-07-07 NOTE — Op Note (Signed)
NAMEATHENE, [name] NO.:  [id number]  MEDICAL RECORD NO.:  [id number]  LOCATION:  4729                         FACILITY:  [facility]  PHYSICIAN:  [name], MD        DATE OF BIRTH:  May 31, 1965  DATE OF PROCEDURE:  07/06/2011 DATE OF DISCHARGE:                              OPERATIVE REPORT   PREOPERATIVE DIAGNOSIS:  Open right leg wound, status post right below- knee amputation.  POSTOPERATIVE DIAGNOSIS:  Open right leg wound, status post right below- knee amputation.  PROCEDURE: 1. Irrigation and excisional debridement of right leg wound, 15 cm x     10 cm x 2 cm, including skin, subcutaneous tissue, muscle, and     bone. 2. Revision right below-knee amputation. 3. Application of wound VAC to right leg incision measuring 18 cm in     length.  SURGEON:  [name], MD.  ANESTHESIA:  General.  ESTIMATED BLOOD LOSS:  Minimal.  TOURNIQUET TIME:  40 minutes.  COMPLICATIONS:  None apparent.  DISPOSITION:  Extubated, awake, and stable to recovery.  INDICATIONS FOR PROCEDURE:  The patient is a 46 year old woman who has an open right leg wound, status post emergent below-knee amputation four days ago.  She presents now for a planned return to the operating room for a staged debridement of the wound and possible revision of below- knee amputation.  She understands the risks and benefits, the alternative treatment options and elects surgical treatment.  She specifically understands risks of bleeding, infection, nerve damage, blood clots, need for additional surgery, revision of amputation, and death.  PROCEDURE IN DETAIL:  After preoperative consent was obtained and the correct operative site was identified, the patient was brought to the operating room and placed supine on the operating table.  General anesthesia was induced.  Preoperative antibiotics were administered. Surgical time-out was taken.  The right lower extremity was prepped and draped in  standard sterile fashion with a tourniquet around the thigh. The patient's previous wound VAC had been removed.  The wound was noted to have abundant hematoma, this was all debrided.  Circumferential excisional debridement was then carried out from the level of the skin down to the subcutaneous tissue to the level of the muscle and bone. All necrotic material was removed sharply with a scalpel.  The distal portion of the tibia was recut and shortened by approximately 2 cm.  The fibula was similarly re-cut and shortened by 2 cm.  The cut surface of the tibia and fibular were beveled with a rasp.  The wound was irrigated copiously with 3 L of normal saline.  Again excision of the necrotic material was performed and contouring of the posterior muscle flap was performed sharply with a #10 blade.  Suture ligatures were used to tie off large perforating vessels in the gastrocnemius.  At this point, the gastrocnemius fascia was repaired to the anterior periosteum of the tibia with imbricating sutures of 0 Vicryl.  The posterior fascia was repaired to the anterior fascia with figure-of-eight sutures of 0 Vicryl.  Subcutaneous tissue was approximated with inverted simple sutures of 3-0 Monocryl.  The skin incisions were closed with 2-0 nylon running sutures.  An  incisional wound VAC dressing was applied.  Webril and Ace wrap was used to dress the leg over the wound VAC.  Tourniquet was released at 40 minutes.  The patient was awake from anesthesia and transported to the recovery room in stable condition.  FOLLOWUP PLAN:  The patient will be nonweightbearing on her right lower extremity.  She will have Physical Therapy and Occupational Therapy consults.  She will likely need nursing home placement for rehab purposes.     [name], MD     JH/MEDQ  D:  07/06/2011  T:  07/07/2011  Job:  [id number]

## 2011-07-07 NOTE — Progress Notes (Signed)
OT Cancellation Note  Treatment cancelled today due to patient's refusal to participate. Will re-attempt as time allows.   Fawnda Vitullo, OTR/L Pager [phone] 07/07/2011, 2:02 PM

## 2011-07-07 NOTE — Progress Notes (Signed)
[facility]                                                                                Patient Demographics  [name], is a 46 y.o. female  ZOX:[id number]  [id number]  DOB - [birth year]  Admit date - 07/01/2011  Admitting Physician [name], MD  Outpatient Primary MD for the patient is FULP, CAMMIE, MD  LOS - 6   Chief Complaint  Patient presents with  . Wound Infection        Subjective:   [name] today has, No headache, No chest pain, No abdominal pain - No Nausea, No new weakness tingling or numbness, No Cough - SOB.  Some postop right leg stump pain.  Objective:   Filed Vitals:   07/07/11 0009 07/07/11 0100 07/07/11 0200 07/07/11 0646  BP: 170/69 155/60 153/60 167/73  Pulse:    105  Temp: 102.3 F (39.1 C) 100 F (37.8 C) 99.5 F (37.5 C) 99.8 F (37.7 C)  TempSrc:    Oral  Resp:    20  Height:      Weight:    99.7 kg (219 lb 12.8 oz)  SpO2:    93%    Wt Readings from Last 3 Encounters:  07/07/11 99.7 kg (219 lb 12.8 oz)  07/07/11 99.7 kg (219 lb 12.8 oz)  07/07/11 99.7 kg (219 lb 12.8 oz)     Intake/Output Summary (Last 24 hours) at 07/07/11 1105 Last data filed at 07/07/11 0937  Gross per 24 hour  Intake   1240 ml  Output   1325 ml  Net    -85 ml    Exam Awake Alert, Oriented *3, No new F.N deficits, Normal affect [place].AT,PERRAL Supple Neck,No JVD, No cervical lymphadenopathy appriciated.  Symmetrical Chest wall movement, Good air movement bilaterally, CTAB RRR,No Gallops,Rubs or new Murmurs, No Parasternal Heave +ve B.Sounds, Abd Soft, Non tender, No organomegaly appriciated, No rebound -guarding or rigidity. No Cyanosis, Clubbing or edema, No new Rash or bruise, right leg stump in bandage  Data Review  CBC  Lab 07/07/11 0650 07/06/11 0520 07/05/11 0420 07/04/11 0937 07/04/11 0420  WBC 9.0 7.1 5.8 6.6 5.4  HGB 11.2* 10.9* 7.3* 7.9* 6.6*  HCT 34.6* 33.1* 22.8* 24.3* 20.4*  PLT 351  323 236 231 194  MCV 86.3 84.2 84.4 84.7 84.6  MCH 27.9 27.7 27.0 27.5 27.4  MCHC 32.4 32.9 32.0 32.5 32.4  RDW 15.9* 15.5 16.1* 15.9* 16.1*  LYMPHSABS -- -- -- -- --  MONOABS -- -- -- -- --  EOSABS -- -- -- -- --  BASOSABS -- -- -- -- --  BANDABS -- -- -- -- --    Chemistries   Lab 07/07/11 0650 07/06/11 0520 07/05/11 0420 07/04/11 0420 07/03/11 0350 07/02/11 0430 07/01/11 2237  NA 136 139 133* 135 137 -- --  K 3.7 4.3 3.5 3.7 4.3 -- --  CL 99 101 99 101 103 -- --  CO2 27 26 24 23 26  -- --  GLUCOSE 152* 120* 133* 103* 164* -- --  BUN 5* 6 6 8 11  -- --  CREATININE 0.95 0.93 0.94 1.04 1.06 -- --  CALCIUM  9.3 8.9 8.2* 8.1* 7.9* -- --  MG -- -- -- 2.0 -- 1.7 --  AST -- -- -- -- 20 -- 30  ALT -- -- -- -- 12 -- 16  ALKPHOS -- -- -- -- 66 -- 96  BILITOT -- -- -- -- 0.2* -- 0.3   ------------------------------------------------------------------------------------------------------------------ estimated creatinine clearance is 91.6 ml/min (by C-G formula based on Cr of 0.95). ------------------------------------------------------------------------------------------------------------------ No results found for this basename: HGBA1C:2 in the last 72 hours ------------------------------------------------------------------------------------------------------------------ No results found for this basename: CHOL:2,HDL:2,LDLCALC:2,TRIG:2,CHOLHDL:2,LDLDIRECT:2 in the last 72 hours ------------------------------------------------------------------------------------------------------------------ No results found for this basename: TSH,T4TOTAL,FREET3,T3FREE,THYROIDAB in the last 72 hours ------------------------------------------------------------------------------------------------------------------ No results found for this basename: VITAMINB12:2,FOLATE:2,FERRITIN:2,TIBC:2,IRON:2,RETICCTPCT:2 in the last 72 hours  Coagulation profile  Lab 07/04/11 0420 07/02/11 0054  INR 1.37 1.55*    PROTIME -- --    No results found for this basename: DDIMER:2 in the last 72 hours  Cardiac Enzymes No results found for this basename: CK:3,CKMB:3,TROPONINI:3,MYOGLOBIN:3 in the last 168 hours ------------------------------------------------------------------------------------------------------------------ No components found with this basename: POCBNP:3  Micro Results Recent Results (from the past 240 hour(s))  CULTURE, BLOOD (ROUTINE X 2)     Status: Normal (Preliminary result)   Collection Time   07/01/11 10:15 PM      Component Value Range Status Comment   Specimen Description BLOOD LEFT ARM   Final    Special Requests BOTTLES DRAWN AEROBIC AND ANAEROBIC 5CC   Final    Culture  Setup Time 07/02/2011 11:15   Final    Culture     Final    Value:        BLOOD CULTURE RECEIVED NO GROWTH TO DATE CULTURE WILL BE HELD FOR 5 DAYS BEFORE ISSUING A FINAL NEGATIVE REPORT   Report Status PENDING   Incomplete   CULTURE, BLOOD (ROUTINE X 2)     Status: Normal (Preliminary result)   Collection Time   07/01/11 10:30 PM      Component Value Range Status Comment   Specimen Description BLOOD LEFT HAND   Final    Special Requests BOTTLES DRAWN AEROBIC AND ANAEROBIC 5CC   Final    Culture  Setup Time 07/02/2011 11:15   Final    Culture     Final    Value:        BLOOD CULTURE RECEIVED NO GROWTH TO DATE CULTURE WILL BE HELD FOR 5 DAYS BEFORE ISSUING A FINAL NEGATIVE REPORT   Report Status PENDING   Incomplete   MRSA PCR SCREENING     Status: Normal   Collection Time   07/02/11  3:00 AM      Component Value Range Status Comment   MRSA by PCR NEGATIVE  NEGATIVE Final     Radiology Reports Dg Chest Port 1 View  07/01/2011  *RADIOLOGY REPORT*  Clinical Data: 46 year old female with fever chest pain increased heart rate.  PORTABLE CHEST - 1 VIEW  Comparison: 10/24/2010.  Findings: Portable semi upright AP view 2327 hours.  Low lung volumes similar the prior study.  Cardiomegaly is difficult to  exclude. Other mediastinal contours are within normal limits. Visualized tracheal air column is within normal limits.  No pneumothorax or large pleural effusion.  Pulmonary vascular congestion without overt edema.  Patchy basilar opacity, slightly greater on the left.  IMPRESSION: Low lung volumes with patchy bibasilar opacity which could reflect atelectasis or pneumonia.  PA and lateral radiographs recommended when possible.  Original Report Authenticated By: Harley Hallmark, M.D.   Dg Foot 2 Views  Right  07/02/2011  *RADIOLOGY REPORT*  Clinical Data: Right foot wound infection with fever and swelling. History of diabetes.  RIGHT FOOT - 2 VIEW  Comparison: 11/22/2010  Findings: Interval trans tarsal amputation of the right foot. Soft tissue defects at the stump of the amputation.  This could represent irregularities associated with the wound or new ulceration.  There is amorphous subcutaneous gas in the soft tissues along the medial aspect of the midfoot at the level of the navicular.  This is consistent with soft tissue infection with gas forming organism.  Visualized bones appear grossly intact.  There is no obvious bone sclerosis, periosteal reaction, or bone destruction to suggest osteomyelitis.  If clinically indicated, MRI would be more sensitive for detection of bone changes.  IMPRESSION: Interval trans tarsal amputation of the right foot with the residual or recurrent defect over the wound.  There is soft tissue gas medial to the navicular consistent with infection with gas forming organism.  No obvious changes of osteomyelitis.  Original Report Authenticated By: Marlon Pel, M.D.    Scheduled Meds:   . benazepril  10 mg Oral Daily  . chlorhexidine  60 mL Topical Once  . enoxaparin  40 mg Subcutaneous Q24H  . HYDROmorphone      . HYDROmorphone      . insulin aspart  0-15 Units Subcutaneous TID WC  . insulin aspart  0-5 Units Subcutaneous QHS  . insulin aspart  4 Units Subcutaneous TID  WC  . insulin glargine  5 Units Subcutaneous QHS  . piperacillin-tazobactam (ZOSYN)  IV  3.375 g Intravenous Q8H  . vancomycin  1,000 mg Intravenous Q12H  . DISCONTD: chlorhexidine  60 mL Topical Once   Continuous Infusions:   . sodium chloride 50 mL/hr at 07/07/11 0043  . lactated ringers 20 mL/hr at 07/06/11 1311  . DISCONTD: sodium chloride 100 mL/hr at 07/06/11 1202   PRN Meds:.acetaminophen, hydrALAZINE, HYDROmorphone (DILAUDID) injection, metoCLOPramide (REGLAN) injection, metoCLOPramide, ondansetron (ZOFRAN) IV, ondansetron, oxyCODONE, DISCONTD: droperidol, DISCONTD:  HYDROmorphone (DILAUDID) injection, DISCONTD: oxyCODONE, DISCONTD: sodium chloride irrigation  Assessment & Plan    Patient is a 46 yo diabetic patient with diabetic foot ulcer and R.BKA on 11/24/2010. Presents to [facility] ED with pain, swelling, purulent malodorous discharge from the stump. Hypotensive, tachycardia and febrile upon presentation. Patient was initially on CCM service. Patient was transferred to [facility] on June 30. Patient was transfused 2 units packed red blood cells on July 1 secondary to hemoglobin of 7.3 ; Patient was taken back to the OR on July 2 per orthopedics.     1. Diabetic foot ulcer associated with type 2 diabetes mellitus status post BKA 11-23-09 and then revision of the stump with I&D on 07-06-11 For now continue on antibiotic. Followed per orthopedics. Wound care per Ortho. Pain meds adjusted 07-07-11.    2.Diabetes mellitus  Good control. Continue sliding scale insulin + added Lantus.   CBG (last 3)   Basename 07/07/11 0651 07/06/11 2130 07/06/11 1834  GLUCAP 127* 95 137*      3. Acute renal failure - prerenal her renal failure has resolved with IVF and PRBC.    4. Hypertension  Patient with history of hypertension. Blood pressure is increasing. Start her on Lotensin for renal protection with her diabetes. Pain control.    5.Anemia -   AOCD   Patient was transfused 2  units of PRBC - satble H&H.     DVT Prophylaxis  Lovenox    Procedures  1. Irrigation and excisional debridement of right leg wound, 15 cm x 10 cm x 2 cm, including skin, subcutaneous tissue, muscle, and  bone.  2. Revision right below-knee amputation.  3. Application of wound VAC to right leg incision measuring 18 cm in  length.    Consults Ortho Diona Browner K M.D on 07/07/2011 at 11:05 AM  Between 7am to 7pm - Pager - [phone]  After 7pm go to [email or web address] - password [email or web address]  And look for the night coverage person covering for me after hours  [facility] Office  [phone]

## 2011-07-07 NOTE — Progress Notes (Addendum)
Pt is resting in bed. Pt's temperature elevated temperature of 102 F. NP on call made aware. No new orders received, will cont to monitor.

## 2011-07-07 NOTE — Evaluation (Addendum)
Physical Therapy Evaluation Patient Details Name: [name] MRN: [id number] DOB: [birth year] Today's Date: 07/07/2011 Time: 1324-4010 PT Time Calculation (min): 35 min  PT Assessment / Plan / Recommendation Clinical Impression  Pt admitted for diabetic foot ulcer, s/p BKA. Pt had partial foot amputation in past, when she got some equipment, but pt currently has only RW at home. Pt presents with decreased strength, activity tolerance, and pain and will benefit from skilled PT to address below impairments and reduce falls risk and caregiver burden at next venue of care. Eval somewhat limited due to radiology coming for pt. Pt has significant barriers to home DC including mutli-level home and only 2 young children with her. Pt educated on optimal positioning for RLE as well as her options and considerations for DC. Bed locked with LEs flat.    PT Assessment  Patient needs continued PT services    Follow Up Recommendations  Inpatient Rehab;Supervision/Assistance - 24 hour    Barriers to Discharge Inaccessible home environment;Decreased caregiver support Lives alone with 7 and 16 yr old children with bedroom on 2nd level    Equipment Recommendations  Defer to next venue    Recommendations for Other Services Rehab consult   Frequency Min 3X/week    Precautions / Restrictions Precautions Precautions: Fall Restrictions Weight Bearing Restrictions: Yes RLE Weight Bearing: Non weight bearing Other Position/Activity Restrictions: R KI   Pertinent Vitals/Pain No complaints except during mobility, nursing gave pain meds during eval      Mobility  Bed Mobility Bed Mobility: Supine to Sit;Sit to Supine;Scooting to HOB Supine to Sit: 4: Min assist;HOB flat;With rails Sit to Supine: 4: Min assist;With rail;HOB flat Scooting to HOB: 4: Min guard Details for Bed Mobility Assistance: Min A for lifting/carrying RLE. PT with stabbing pain during unassisted mobility  Transfers Transfers: Sit  to Stand;Stand to Sit;Stand Pivot Transfers Sit to Stand: 3: Mod assist;With upper extremity assist;From bed;From chair/3-in-1;With armrests Stand to Sit: 4: Min assist;With upper extremity assist;With armrests;To chair/3-in-1;To bed Stand Pivot Transfers: 3: Mod assist Details for Transfer Assistance: Mod A overall for steadying and RW management during stand-step/pivot transfer bed<>3 in 1. Demonstrated each technique and gave step-by-step cues for instruction, which helped reduce pt's anxiety with mobility with new BKA. Pt able to hop and pivot on LLE, btu c/o LE feeling weak as though it would give out.  Ambulation/Gait Ambulation/Gait Assistance: Not tested (comment) (Radiology coming for pt, limiting eval) Stairs: No Wheelchair Mobility Wheelchair Mobility: No    Exercises     PT Diagnosis: Difficulty walking;Generalized weakness;Acute pain  PT Problem List: Decreased strength;Decreased range of motion;Decreased activity tolerance;Decreased balance;Decreased mobility;Decreased knowledge of use of DME;Decreased safety awareness;Decreased knowledge of precautions;Pain;Decreased skin integrity PT Treatment Interventions: DME instruction;Gait training;Stair training;Functional mobility training;Therapeutic activities;Balance training;Therapeutic exercise;Neuromuscular re-education;Patient/family education;Wheelchair mobility training   PT Goals Acute Rehab PT Goals PT Goal Formulation: With patient Time For Goal Achievement: 07/14/11 Potential to Achieve Goals: Good Pt will go Supine/Side to Sit: with modified independence;with HOB 0 degrees PT Goal: Supine/Side to Sit - Progress: Goal set today Pt will go Sit to Supine/Side: with modified independence;with HOB 0 degrees PT Goal: Sit to Supine/Side - Progress: Goal set today Pt will Transfer Bed to Chair/Chair to Bed: with supervision PT Transfer Goal: Bed to Chair/Chair to Bed - Progress: Goal set today Pt will Ambulate: 1 - 15  feet;with min assist;with rolling walker PT Goal: Ambulate - Progress: Goal set today  Visit Information  Last PT Received On: 07/07/11  Assistance Needed: +2    Subjective Data  Subjective: I'm having shoting pains in my leg Patient Stated Goal: Learn how to get around   Prior Functioning  Home Living Lives With: Son (2 young sons, 7 and 9) Type of Home: House Home Access: Stairs to enter Secretary/administrator of Steps: 1 Entrance Stairs-Rails: None Home Layout: Two level;Bed/bath upstairs Alternate Level Stairs-Number of Steps: 14 Alternate Level Stairs-Rails: Can reach both;Left (2 rails half way up, then on L only) Bathroom Shower/Tub: Tub/shower unit;Curtain Firefighter: Standard Bathroom Accessibility: No Home Adaptive Equipment: Walker - rolling Additional Comments: Loaned uncle BSC, rented hospital bed after last surgery as well as WC Prior Function Level of Independence: Independent Able to Take Stairs?: Yes Driving: Yes Comments: Following last surgery, pt rented [facility] and hospital bed to live on main level, kept RW and BSC, but loaned BCS out to uncle Communication Communication: No difficulties    Cognition  Overall Cognitive Status: Appears within functional limits for tasks assessed/performed Arousal/Alertness: Awake/alert Orientation Level: Appears intact for tasks assessed Behavior During Session: [facility] for tasks performed    Extremity/Trunk Assessment Right Upper Extremity Assessment RUE ROM/Strength/Tone: Within functional levels Left Upper Extremity Assessment LUE ROM/Strength/Tone: Within functional levels Right Lower Extremity Assessment RLE ROM/Strength/Tone: Deficits;Unable to fully assess;Due to pain RLE ROM/Strength/Tone Deficits: Painful with mobitliy, but able to SLR  Left Lower Extremity Assessment LLE ROM/Strength/Tone: Within functional levels Trunk Assessment Trunk Assessment: Normal   Balance Balance Balance Assessed: Yes Static  Sitting Balance Static Sitting - Balance Support: Right upper extremity supported;Left upper extremity supported;Feet supported Static Sitting - Level of Assistance: 5: Stand by assistance Static Sitting - Comment/# of Minutes: Sat EOB x67min during equipment set-up Dynamic Sitting Balance Dynamic Sitting - Balance Support: Left upper extremity supported;Right upper extremity supported;During functional activity Dynamic Sitting - Level of Assistance: 4: Min assist Dynamic Sitting - Balance Activities: Lateral lean/weight shifting;Forward lean/weight shifting;Reaching for objects Static Standing Balance Static Standing - Balance Support: Right upper extremity supported;During functional activity Static Standing - Level of Assistance: 4: Min assist Static Standing - Comment/# of Minutes: Pt was able to stand at RW with only RUE support to perform personal hygiene.   End of Session PT - End of Session Equipment Utilized During Treatment: Gait belt;Right knee immobilizer Activity Tolerance: Patient tolerated treatment well Patient left: in bed;with call bell/phone within reach (Radiology in room for procedure) Nurse Communication: Mobility status  GP     Virl Cagey, PT [phone]  07/07/2011, 4:02 PM

## 2011-07-08 DIAGNOSIS — S88119A Complete traumatic amputation at level between knee and ankle, unspecified lower leg, initial encounter: Secondary | ICD-10-CM

## 2011-07-08 DIAGNOSIS — L98499 Non-pressure chronic ulcer of skin of other sites with unspecified severity: Secondary | ICD-10-CM

## 2011-07-08 DIAGNOSIS — I739 Peripheral vascular disease, unspecified: Secondary | ICD-10-CM

## 2011-07-08 LAB — URINALYSIS, ROUTINE W REFLEX MICROSCOPIC
Bilirubin Urine: NEGATIVE
Glucose, UA: NEGATIVE mg/dL
Hgb urine dipstick: NEGATIVE
Ketones, ur: NEGATIVE mg/dL
Leukocytes, UA: NEGATIVE
Nitrite: NEGATIVE
Protein, ur: NEGATIVE mg/dL
Specific Gravity, Urine: 1.01 (ref 1.005–1.030)
Urobilinogen, UA: 2 mg/dL — ABNORMAL HIGH (ref 0.0–1.0)
pH: 7 (ref 5.0–8.0)

## 2011-07-08 LAB — BASIC METABOLIC PANEL
BUN: 7 mg/dL (ref 6–23)
CO2: 24 mEq/L (ref 19–32)
Calcium: 8.5 mg/dL (ref 8.4–10.5)
Chloride: 101 mEq/L (ref 96–112)
Creatinine, Ser: 1.06 mg/dL (ref 0.50–1.10)
GFR calc Af Amer: 73 mL/min — ABNORMAL LOW (ref >90)
GFR calc non Af Amer: 63 mL/min — ABNORMAL LOW (ref >90)
Glucose, Bld: 128 mg/dL — ABNORMAL HIGH (ref 70–99)
Potassium: 3.5 mEq/L (ref 3.5–5.1)
Sodium: 136 mEq/L (ref 135–145)

## 2011-07-08 LAB — CBC
HCT: 27.4 % — ABNORMAL LOW (ref 36.0–46.0)
Hemoglobin: 8.9 g/dL — ABNORMAL LOW (ref 12.0–15.0)
MCH: 27.8 pg (ref 26.0–34.0)
MCHC: 32.5 g/dL (ref 30.0–36.0)
MCV: 85.6 fL (ref 78.0–100.0)
Platelets: 347 10*3/uL (ref 150–400)
RBC: 3.2 MIL/uL — ABNORMAL LOW (ref 3.87–5.11)
RDW: 15.9 % — ABNORMAL HIGH (ref 11.5–15.5)
WBC: 8 10*3/uL (ref 4.0–10.5)

## 2011-07-08 LAB — CULTURE, BLOOD (ROUTINE X 2)
Culture: NO GROWTH
Culture: NO GROWTH

## 2011-07-08 LAB — GLUCOSE, CAPILLARY
Glucose-Capillary: 137 mg/dL — ABNORMAL HIGH (ref 70–99)
Glucose-Capillary: 102 mg/dL — ABNORMAL HIGH (ref 70–99)
Glucose-Capillary: 91 mg/dL (ref 70–99)
Glucose-Capillary: 97 mg/dL (ref 70–99)

## 2011-07-08 LAB — MAGNESIUM: Magnesium: 1.9 mg/dL (ref 1.5–2.5)

## 2011-07-08 MED ORDER — POTASSIUM CHLORIDE CRYS ER 20 MEQ PO TBCR
40.0000 meq | EXTENDED_RELEASE_TABLET | Freq: Once | ORAL | Status: AC
  Administered 2011-07-08: 40 meq via ORAL
  Filled 2011-07-08: qty 2

## 2011-07-08 MED ORDER — SODIUM CHLORIDE 0.9 % IV SOLN: INTRAVENOUS | Status: AC

## 2011-07-08 NOTE — Evaluation (Signed)
Occupational Therapy Evaluation Patient Details Name: [name] MRN: [id number] DOB: [birth year] Today's Date: 07/08/2011 Time: 7829-5621 OT Time Calculation (min): 12 min  OT Assessment / Plan / Recommendation Clinical Impression  Pt. presents s/p Rt. BKA and with increased pain. Pt will benefit from skilled OT to increase functional independence with ADLs to supervision-min assist level at D/C    OT Assessment  Patient needs continued OT Services    Follow Up Recommendations  Inpatient Rehab    Barriers to Discharge Decreased caregiver support    Equipment Recommendations  Defer to next venue    Recommendations for Other Services Rehab consult  Frequency  Min 2X/week    Precautions / Restrictions Precautions Precautions: Fall Restrictions Weight Bearing Restrictions: Yes RLE Weight Bearing: Non weight bearing Other Position/Activity Restrictions: R KI       ADL  Eating/Feeding: Performed;Set up Where Assessed - Eating/Feeding: Chair Grooming: Performed;Wash/dry hands;Set up Where Assessed - Grooming: Supported sitting Upper Body Bathing: Simulated;Set up Where Assessed - Upper Body Bathing: Unsupported sitting Lower Body Bathing: Simulated;Moderate assistance Where Assessed - Lower Body Bathing: Unsupported sit to stand Upper Body Dressing: Performed;Minimal assistance (donning gown) Where Assessed - Upper Body Dressing: Unsupported sitting Lower Body Dressing: Performed;Moderate assistance (pt donned left sock and assist with rt. LE) Where Assessed - Lower Body Dressing: Unsupported sit to stand Toilet Transfer: Simulated;Moderate assistance Toilet Transfer Method: Stand pivot Toilet Transfer Equipment: Other (comment) Nurse, children's) Toileting - Clothing Manipulation and Hygiene: Simulated;Minimal assistance Where Assessed - Toileting Clothing Manipulation and Hygiene: Sit to stand from 3-in-1 or toilet Tub/Shower Transfer Method: Not assessed Equipment Used:  Rolling walker;Knee Immobilizer Transfers/Ambulation Related to ADLs: Max verbal cues for hand placement and technique ADL Comments: Pt. educated on technique of transfer and hand placement to increase pt. safety.     OT Diagnosis: Generalized weakness;Acute pain  OT Problem List: Decreased strength;Decreased activity tolerance;Impaired balance (sitting and/or standing);Decreased safety awareness;Decreased knowledge of use of DME or AE;Decreased knowledge of precautions;Pain OT Treatment Interventions: Self-care/ADL training;DME and/or AE instruction;Therapeutic activities;Patient/family education;Balance training   OT Goals Acute Rehab OT Goals OT Goal Formulation: With patient Time For Goal Achievement: 07/15/11 Potential to Achieve Goals: Good ADL Goals Pt Will Perform Grooming: Standing at sink;with supervision;with set-up ADL Goal: Grooming - Progress: Goal set today Pt Will Perform Lower Body Dressing: with set-up;with supervision;Sit to stand from chair;with adaptive equipment ADL Goal: Lower Body Dressing - Progress: Goal set today Pt Will Transfer to Toilet: with supervision;with DME;Ambulation;3-in-1 ADL Goal: Toilet Transfer - Progress: Goal set today Additional ADL Goal #1: Pt. will complete bed mobility with mod I in prep for BADLS ADL Goal: Additional Goal #1 - Progress: Goal set today  Visit Information  Last OT Received On: 07/08/11 Assistance Needed: +2    Subjective Data  Subjective: "I need my bear with me" Patient Stated Goal: "Get better and back to my babies"   Prior Functioning  Home Living Lives With: Son Available Help at Discharge: Family Type of Home: House Home Access: Stairs to enter Secretary/administrator of Steps: 1 Entrance Stairs-Rails: None Home Layout: Two level;Bed/bath upstairs Alternate Level Stairs-Number of Steps: 14 Alternate Level Stairs-Rails: Can reach both;Left Bathroom Shower/Tub: Tub/shower unit;Curtain Bathroom Toilet:  Standard Bathroom Accessibility: No Home Adaptive Equipment: Walker - rolling Additional Comments: Loaned uncle BSC, rented hospital bed after last surgery as well as WC Prior Function Level of Independence: Independent Able to Take Stairs?: Yes Driving: Yes Vocation: On disability Comments:   Following last surgery,  pt rented [facility] and hospital bed to live on main level, kept RW and BSC, but loaned BCS out to uncle  Communication Communication: No difficulties    Cognition  Overall Cognitive Status: Appears within functional limits for tasks assessed/performed Arousal/Alertness: Awake/alert Orientation Level: Appears intact for tasks assessed Behavior During Session: Flat affect Cognition - Other Comments: Pt. with OCD tendencies and very particular about placement of items during session    Extremity/Trunk Assessment Right Upper Extremity Assessment RUE ROM/Strength/Tone: Within functional levels RUE Sensation: WFL - Light Touch RUE Coordination: WFL - gross/fine motor Left Upper Extremity Assessment LUE ROM/Strength/Tone: Within functional levels LUE Sensation: WFL - Light Touch LUE Coordination: WFL - gross/fine motor   Mobility Bed Mobility Bed Mobility: Supine to Sit;Sit to Supine;Scooting to HOB Supine to Sit: 4: Min assist;HOB flat;With rails Sit to Supine: 4: Min assist;With rail;HOB flat Scooting to HOB: 4: Min guard Details for Bed Mobility Assistance: Min assist with use of pad to facilitate scooting EOB Transfers Transfers: Sit to Stand;Stand to Sit Sit to Stand: 3: Mod assist;With upper extremity assist;From bed;From chair/3-in-1;With armrests Stand to Sit: 4: Min assist;With upper extremity assist;With armrests;To chair/3-in-1;To bed Details for Transfer Assistance: Assist for moving RW during transfers         End of Session OT - End of Session Equipment Utilized During Treatment: Gait belt;Right knee immobilizer Activity Tolerance: Patient tolerated treatment  well Patient left: in chair;with call bell/phone within reach Nurse Communication: Mobility status       Loxley Cibrian, OTR/L Pager [phone] 07/08/2011, 2:41 PM

## 2011-07-08 NOTE — Progress Notes (Signed)
[facility]                                                                                Patient Demographics  [name], is a 46 y.o. female  XLK:[id number]  [id number]  DOB - 03-31-1965  Admit date - 07/01/2011  Admitting Physician [name], MD  Outpatient Primary MD for the patient is FULP, CAMMIE, MD  LOS - 7   Chief Complaint  Patient presents with  . Wound Infection        Subjective:   [name] today has, No headache, No chest pain, No abdominal pain - No Nausea, No new weakness tingling or numbness, No Cough - SOB.  Some postop right leg stump pain.   Objective:   Filed Vitals:   07/07/11 1725 07/07/11 2112 07/08/11 0006 07/08/11 0733  BP: 150/73 134/62  134/63  Pulse: 93 94  89  Temp: 99 F (37.2 C) 100.6 F (38.1 C) 98.7 F (37.1 C) 99.7 F (37.6 C)  TempSrc: Oral Oral Oral Oral  Resp: 20 20  18   Height:      Weight:    99.973 kg (220 lb 6.4 oz)  SpO2: 91% 96%  93%    Wt Readings from Last 3 Encounters:  07/08/11 99.973 kg (220 lb 6.4 oz)  07/08/11 99.973 kg (220 lb 6.4 oz)  07/08/11 99.973 kg (220 lb 6.4 oz)     Intake/Output Summary (Last 24 hours) at 07/08/11 0900 Last data filed at 07/08/11 0147  Gross per 24 hour  Intake    480 ml  Output    850 ml  Net   -370 ml    Exam Awake Alert, Oriented *3, No new F.N deficits, Normal affect [place].AT,PERRAL Supple Neck,No JVD, No cervical lymphadenopathy appriciated.  Symmetrical Chest wall movement, Good air movement bilaterally, CTAB RRR,No Gallops,Rubs or new Murmurs, No Parasternal Heave +ve B.Sounds, Abd Soft, Non tender, No organomegaly appriciated, No rebound -guarding or rigidity. No Cyanosis, Clubbing or edema, No new Rash or bruise, right leg stump in bandage  Data Review  CBC  Lab 07/08/11 0608 07/07/11 0650 07/06/11 0520 07/05/11 0420 07/04/11 0937  WBC 8.0 9.0 7.1 5.8 6.6  HGB 8.9* 11.2* 10.9* 7.3* 7.9*  HCT 27.4* 34.6* 33.1*  22.8* 24.3*  PLT 347 351 323 236 231  MCV 85.6 86.3 84.2 84.4 84.7  MCH 27.8 27.9 27.7 27.0 27.5  MCHC 32.5 32.4 32.9 32.0 32.5  RDW 15.9* 15.9* 15.5 16.1* 15.9*  LYMPHSABS -- -- -- -- --  MONOABS -- -- -- -- --  EOSABS -- -- -- -- --  BASOSABS -- -- -- -- --  BANDABS -- -- -- -- --    Chemistries   Lab 07/08/11 9563 07/07/11 0650 07/06/11 0520 07/05/11 0420 07/04/11 0420 07/03/11 0350 07/02/11 0430 07/01/11 2237  NA 136 136 139 133* 135 -- -- --  K 3.5 3.7 4.3 3.5 3.7 -- -- --  CL 101 99 101 99 101 -- -- --  CO2 24 27 26 24 23  -- -- --  GLUCOSE 128* 152* 120* 133* 103* -- -- --  BUN 7 5* 6 6 8  -- -- --  CREATININE 1.06 0.95 0.93 0.94 1.04 -- -- --  CALCIUM 8.5 9.3 8.9 8.2* 8.1* -- -- --  MG 1.9 -- -- -- 2.0 -- 1.7 --  AST -- -- -- -- -- 20 -- 30  ALT -- -- -- -- -- 12 -- 16  ALKPHOS -- -- -- -- -- 66 -- 96  BILITOT -- -- -- -- -- 0.2* -- 0.3   ------------------------------------------------------------------------------------------------------------------ estimated creatinine clearance is 82.3 ml/min (by C-G formula based on Cr of 1.06). ------------------------------------------------------------------------------------------------------------------ No results found for this basename: HGBA1C:2 in the last 72 hours ------------------------------------------------------------------------------------------------------------------ No results found for this basename: CHOL:2,HDL:2,LDLCALC:2,TRIG:2,CHOLHDL:2,LDLDIRECT:2 in the last 72 hours ------------------------------------------------------------------------------------------------------------------ No results found for this basename: TSH,T4TOTAL,FREET3,T3FREE,THYROIDAB in the last 72 hours ------------------------------------------------------------------------------------------------------------------ No results found for this basename: VITAMINB12:2,FOLATE:2,FERRITIN:2,TIBC:2,IRON:2,RETICCTPCT:2 in the last 72  hours  Coagulation profile  Lab 07/04/11 0420 07/02/11 0054  INR 1.37 1.55*  PROTIME -- --    No results found for this basename: DDIMER:2 in the last 72 hours  Cardiac Enzymes No results found for this basename: CK:3,CKMB:3,TROPONINI:3,MYOGLOBIN:3 in the last 168 hours ------------------------------------------------------------------------------------------------------------------ No components found with this basename: POCBNP:3  Micro Results Recent Results (from the past 240 hour(s))  CULTURE, BLOOD (ROUTINE X 2)     Status: Normal (Preliminary result)   Collection Time   07/01/11 10:15 PM      Component Value Range Status Comment   Specimen Description BLOOD LEFT ARM   Final    Special Requests BOTTLES DRAWN AEROBIC AND ANAEROBIC 5CC   Final    Culture  Setup Time 07/02/2011 11:15   Final    Culture     Final    Value:        BLOOD CULTURE RECEIVED NO GROWTH TO DATE CULTURE WILL BE HELD FOR 5 DAYS BEFORE ISSUING A FINAL NEGATIVE REPORT   Report Status PENDING   Incomplete   CULTURE, BLOOD (ROUTINE X 2)     Status: Normal (Preliminary result)   Collection Time   07/01/11 10:30 PM      Component Value Range Status Comment   Specimen Description BLOOD LEFT HAND   Final    Special Requests BOTTLES DRAWN AEROBIC AND ANAEROBIC 5CC   Final    Culture  Setup Time 07/02/2011 11:15   Final    Culture     Final    Value:        BLOOD CULTURE RECEIVED NO GROWTH TO DATE CULTURE WILL BE HELD FOR 5 DAYS BEFORE ISSUING A FINAL NEGATIVE REPORT   Report Status PENDING   Incomplete   MRSA PCR SCREENING     Status: Normal   Collection Time   07/02/11  3:00 AM      Component Value Range Status Comment   MRSA by PCR NEGATIVE  NEGATIVE Final     Radiology Reports Dg Chest Port 1 View  07/01/2011  *RADIOLOGY REPORT*  Clinical Data: 46 year old female with fever chest pain increased heart rate.  PORTABLE CHEST - 1 VIEW  Comparison: 10/24/2010.  Findings: Portable semi upright AP view 2327  hours.  Low lung volumes similar the prior study.  Cardiomegaly is difficult to exclude. Other mediastinal contours are within normal limits. Visualized tracheal air column is within normal limits.  No pneumothorax or large pleural effusion.  Pulmonary vascular congestion without overt edema.  Patchy basilar opacity, slightly greater on the left.  IMPRESSION: Low lung volumes with patchy bibasilar opacity which could reflect atelectasis or pneumonia.  PA and lateral radiographs recommended when  possible.  Original Report Authenticated By: Harley Hallmark, M.D.   Dg Foot 2 Views Right  07/02/2011  *RADIOLOGY REPORT*  Clinical Data: Right foot wound infection with fever and swelling. History of diabetes.  RIGHT FOOT - 2 VIEW  Comparison: 11/22/2010  Findings: Interval trans tarsal amputation of the right foot. Soft tissue defects at the stump of the amputation.  This could represent irregularities associated with the wound or new ulceration.  There is amorphous subcutaneous gas in the soft tissues along the medial aspect of the midfoot at the level of the navicular.  This is consistent with soft tissue infection with gas forming organism.  Visualized bones appear grossly intact.  There is no obvious bone sclerosis, periosteal reaction, or bone destruction to suggest osteomyelitis.  If clinically indicated, MRI would be more sensitive for detection of bone changes.  IMPRESSION: Interval trans tarsal amputation of the right foot with the residual or recurrent defect over the wound.  There is soft tissue gas medial to the navicular consistent with infection with gas forming organism.  No obvious changes of osteomyelitis.  Original Report Authenticated By: Marlon Pel, M.D.    Scheduled Meds:    . benazepril  10 mg Oral Daily  . enoxaparin  40 mg Subcutaneous Q24H  . fentaNYL  25 mcg Transdermal Q72H  . insulin aspart  0-15 Units Subcutaneous TID WC  . insulin aspart  0-5 Units Subcutaneous QHS  .  insulin aspart  4 Units Subcutaneous TID WC  . insulin glargine  5 Units Subcutaneous QHS  . piperacillin-tazobactam (ZOSYN)  IV  3.375 g Intravenous Q8H  . potassium chloride  40 mEq Oral Once  . vancomycin  1,000 mg Intravenous Q12H   Continuous Infusions:    . sodium chloride 50 mL/hr (07/08/11 0045)  . DISCONTD: lactated ringers 20 mL/hr at 07/06/11 1311   PRN Meds:.acetaminophen, hydrALAZINE, HYDROcodone-acetaminophen, HYDROmorphone (DILAUDID) injection, metoCLOPramide (REGLAN) injection, ondansetron (ZOFRAN) IV, ondansetron, oxyCODONE, DISCONTD:  HYDROmorphone (DILAUDID) injection, DISCONTD: metoCLOPramide  Assessment & Plan    Patient is a 46 yo diabetic patient with diabetic foot ulcer and R.BKA on 11/24/2010. Presents to [facility] ED with pain, swelling, purulent malodorous discharge from the stump. Hypotensive, tachycardia and febrile upon presentation. Patient was initially on CCM service. Patient was transferred to [facility] on June 30. Patient was transfused 2 units packed red blood cells on July 1 secondary to hemoglobin of 7.3 ; Patient was taken back to the OR on July 2 per orthopedics.     1. Diabetic foot ulcer associated with type 2 diabetes mellitus status post BKA 11-23-09 and then revision of the stump with I&D on 07-06-11 For now continue on antibiotic. Followed per orthopedics. Wound care per Ortho. Pain meds adjusted 07-07-11.had a temp of 102 on 07-07-11, ? Due to atelectasis, B cultures, UA and CXR repeated, Vanco added to zosyn, ortho following, temp better, IS added.    2.Diabetes mellitus  Good control. Continue sliding scale insulin + added Lantus.   CBG (last 3)   Basename 07/08/11 0618 07/07/11 2148 07/07/11 1620  GLUCAP 137* 139* 157*      3. Acute renal failure - prerenal her renal failure has resolved with IVF and PRBC.    4. Hypertension  Patient with history of hypertension. Blood pressure is increasing. Start her on Lotensin for renal  protection with her diabetes. Pain control.    5.Anemia -   AOCD   Patient was transfused 2 units of PRBC - satble  H&H.     DVT Prophylaxis  Lovenox     Procedures   1. Irrigation and excisional debridement of right leg wound, 15 cm x 10 cm x 2 cm, including skin, subcutaneous tissue, muscle, and  bone.  2. Revision right below-knee amputation.  3. Application of wound VAC to right leg incision measuring 18 cm in  length.    Consults Ortho Diona Browner K M.D on 07/08/2011 at 9:00 AM  Between 7am to 7pm - Pager - [phone]  After 7pm go to [email or web address] - password [email or web address]  And look for the night coverage person covering for me after hours  [facility] Office  [phone]

## 2011-07-08 NOTE — Consult Note (Signed)
Physical Medicine and Rehabilitation Consult Reason for Consult: Left BKA  Referring Physician:  Dr. Thedore Mins   HPI: [name] is a 46 y.o. female with history of DM with right transmet foot amputation for diabetic foot ulcer and poorly healing healing wound and refusing multiple attempts at hospital admission for treatment of infection. Admitted on 06/28 with significant increase in drainage with foul smell due to gangrenous changes, nausea and vomiting and fever. Patient noted to be septic with hypotension and tachycardia.  Started on IV antibiotics and underwent  R-BKA with placement of VAC on open wound and I & D of L- foot plantar diabetic ulcer on the same day by Dr. [name]. On 07/02, patient taken back to OR for I and D with R-BKA revision and VAC application. Has been transfused with 2 units PRBC for anemia.  PT evaluation done yesterday. MD, PT recommending CIR.     Review of Systems  Constitutional: Positive for fever. Negative for chills.  HENT: Negative for neck pain.   Eyes: Negative for blurred vision and double vision.  Respiratory: Negative for cough and shortness of breath.   Cardiovascular: Positive for leg swelling. Negative for chest pain and palpitations.  Gastrointestinal: Positive for constipation. Negative for heartburn, nausea and abdominal pain.  Genitourinary: Negative for urgency and frequency.  Musculoskeletal: Positive for back pain.  Neurological: Positive for sensory change and headaches.  Psychiatric/Behavioral: The patient does not have insomnia.   All other systems reviewed and are negative.   Past Medical History  Diagnosis Date  . Diabetes mellitus   . Hypertension   . Arthritis   . Neuropathy   . Diabetic foot ulcer associated with type 2 diabetes mellitus   . Intracerebral hemorrhage As a teenager     States she had burst blood vessel as teenager, now with resultant minor visual field and hearing deficits   Past Surgical History    Procedure Date  . Cesarean section   . Amputation 11/24/2010    Procedure: AMPUTATION FOOT;  Surgeon: [name], MD;  Location: [facility] OR;  Service: Orthopedics;  Laterality: Right;  Right  FOOT TRANS METATARSAL AMPUTATION  . I&d extremity 11/24/2010    Procedure: IRRIGATION AND DEBRIDEMENT EXTREMITY;  Surgeon: [name], MD;  Location: [facility] OR;  Service: Orthopedics;  Laterality: Left;  Debriedment of left plantar ulcer and trimming of toenails  . Amputation 07/02/2011    Procedure: AMPUTATION BELOW KNEE;  Surgeon: [name], MD;  Location: [facility] OR;  Service: Orthopedics;  Laterality: Right;  . Application of wound vac 07/02/2011    Procedure: APPLICATION OF WOUND VAC;  Surgeon: [name], MD;  Location: [facility] OR;  Service: Orthopedics;  Laterality: Right;  . I&d extremity 07/02/2011    Procedure: IRRIGATION AND DEBRIDEMENT EXTREMITY;  Surgeon: [name], MD;  Location: [facility] OR;  Service: Orthopedics;  Laterality: Left;  I & D of Left Foot  . I&d extremity 07/06/2011    Procedure: IRRIGATION AND DEBRIDEMENT EXTREMITY;  Surgeon: [name], MD;  Location: [facility] OR;  Service: Orthopedics;  Laterality: Right;  IRRIGATION/DEBRIDEMENT RIGHT BELOW KNEE AMPUTATION   Family History  Problem Relation Age of Onset  . Diabetes Mother   . Diabetes Father   . Heart disease Maternal Grandmother    Social History:  Widowed. (Husband passed away last 22-Nov-2022- 2 children 7 and 9 yr). Homemaker. She reports that she has never smoked. She does not have any smokeless tobacco history on file. She reports that she has a glass  of wine once a week. Does not use illicit drugs. Cousin in [place] taking care of children.  (Has returned WC and hospital bed)  Allergies: No Known Allergies  Medications Prior to Admission  Medication Sig Dispense Refill  . aspirin EC 81 MG tablet Take 81 mg by mouth daily.      . ferrous sulfate 325 (65 FE) MG tablet Take 325 mg by mouth 2 (two) times daily with a meal.      . insulin aspart  (NOVOLOG) 100 UNIT/ML injection Inject 0-15 Units into the skin 3 (three) times daily before meals. Inject 0-15 Units into the skin 3 (three) times daily with meals.   CBG 70 - 120: 0 units  CBG 121 - 150: 2 units CBG 151 - 200: 3 units  CBG 201 - 250: 5 units  CBG 251 - 300: 8 units CBG 301 - 350: 11 units CBG 351 - 400: 15 units  If CBG>400 CALL MD      . insulin glargine (LANTUS) 100 UNIT/ML injection Inject 15 Units into the skin at bedtime.      . naproxen sodium (ANAPROX) 220 MG tablet Take 220 mg by mouth 2 (two) times daily with a meal.        Home: Home Living Lives With: Son (2 young sons, 7 and 9) Type of Home: House Home Access: Stairs to enter Secretary/administrator of Steps: 1 Entrance Stairs-Rails: None Home Layout: Two level;Bed/bath upstairs Alternate Level Stairs-Number of Steps: 14 Alternate Level Stairs-Rails: Can reach both;Left (2 rails half way up, then on L only) Bathroom Shower/Tub: Tub/shower unit;Curtain Firefighter: Standard Bathroom Accessibility: No Home Adaptive Equipment: Walker - rolling Additional Comments: Loaned uncle BSC, rented hospital bed after last surgery as well as WC  Functional History: Prior Function Able to Take Stairs?: Yes Driving: Yes Comments: Following last surgery, pt rented [facility] and hospital bed to live on main level, kept RW and BSC, but loaned BCS out to uncle Functional Status:  Mobility: Bed Mobility Bed Mobility: Supine to Sit;Sit to Supine;Scooting to HOB Supine to Sit: 4: Min assist;HOB flat;With rails Sit to Supine: 4: Min assist;With rail;HOB flat Scooting to HOB: 4: Min guard Transfers Transfers: Sit to Stand;Stand to [employer] Transfers Sit to Stand: 3: Mod assist;With upper extremity assist;From bed;From chair/3-in-1;With armrests Stand to Sit: 4: Min assist;With upper extremity assist;With armrests;To chair/3-in-1;To bed Stand Pivot Transfers: 3: Mod assist Ambulation/Gait Ambulation/Gait  Assistance: Not tested (comment) (Radiology coming for pt, limiting eval) Stairs: No Wheelchair Mobility Wheelchair Mobility: No  ADL:    Cognition: Cognition Arousal/Alertness: Awake/alert Orientation Level: Oriented X4 Cognition Overall Cognitive Status: Appears within functional limits for tasks assessed/performed Arousal/Alertness: Awake/alert Orientation Level: Appears intact for tasks assessed Behavior During Session: [facility] for tasks performed  Blood pressure 134/63, pulse 89, temperature 99.7 F (37.6 C), temperature source Oral, resp. rate 18, height 5\' 7"  (1.702 m), weight 99.973 kg (220 lb 6.4 oz), last menstrual period 06/17/2011, SpO2 93.00%. Physical Exam  Nursing note and vitals reviewed. Constitutional: She is oriented to person, place, and time. She appears well-developed and well-nourished.  HENT:  Head: Normocephalic and atraumatic.  Eyes: Pupils are equal, round, and reactive to light.  Neck: Normal range of motion. Neck supple.  Cardiovascular: Normal rate and regular rhythm.   Pulmonary/Chest: Effort normal and breath sounds normal.  Abdominal: Soft. Bowel sounds are normal.  Musculoskeletal: She exhibits edema.  Neurological: She is alert and oriented to person, place, and time. A sensory  deficit is present. No cranial nerve deficit.       Upper extremity strength grossly 4/5. Right lower extremity he lifted against gravity while in an immobilizer. Left leg grossly 3+ to 4/5. Cognitively she is quite intact.  Skin:       Right leg dressed and in the immobilizer. Some breakdown noted over left foot with Ace wrap in place.    Results for orders placed during the hospital encounter of 07/01/11 (from the past 24 hour(s))  GLUCOSE, CAPILLARY     Status: Abnormal   Collection Time   07/07/11 10:58 AM      Component Value Range   Glucose-Capillary 166 (*) 70 - 99 mg/dL   Comment 1 Notify RN    GLUCOSE, CAPILLARY     Status: Abnormal   Collection Time   07/07/11   4:20 PM      Component Value Range   Glucose-Capillary 157 (*) 70 - 99 mg/dL   Comment 1 Notify RN    GLUCOSE, CAPILLARY     Status: Abnormal   Collection Time   07/07/11  9:48 PM      Component Value Range   Glucose-Capillary 139 (*) 70 - 99 mg/dL   Comment 1 Notify RN     Comment 2 Documented in Chart    URINALYSIS, ROUTINE W REFLEX MICROSCOPIC     Status: Abnormal   Collection Time   07/08/11 12:51 AM      Component Value Range   Color, Urine YELLOW  YELLOW   APPearance CLEAR  CLEAR   Specific Gravity, Urine 1.010  1.005 - 1.030   pH 7.0  5.0 - 8.0   Glucose, UA NEGATIVE  NEGATIVE mg/dL   Hgb urine dipstick NEGATIVE  NEGATIVE   Bilirubin Urine NEGATIVE  NEGATIVE   Ketones, ur NEGATIVE  NEGATIVE mg/dL   Protein, ur NEGATIVE  NEGATIVE mg/dL   Urobilinogen, UA 2.0 (*) 0.0 - 1.0 mg/dL   Nitrite NEGATIVE  NEGATIVE   Leukocytes, UA NEGATIVE  NEGATIVE  CBC     Status: Abnormal   Collection Time   07/08/11  6:08 AM      Component Value Range   WBC 8.0  4.0 - 10.5 K/uL   RBC 3.20 (*) 3.87 - 5.11 MIL/uL   Hemoglobin 8.9 (*) 12.0 - 15.0 g/dL   HCT 14.7 (*) 82.9 - 56.2 %   MCV 85.6  78.0 - 100.0 fL   MCH 27.8  26.0 - 34.0 pg   MCHC 32.5  30.0 - 36.0 g/dL   RDW 13.0 (*) 86.5 - 78.4 %   Platelets 347  150 - 400 K/uL  BASIC METABOLIC PANEL     Status: Abnormal   Collection Time   07/08/11  6:08 AM      Component Value Range   Sodium 136  135 - 145 mEq/L   Potassium 3.5  3.5 - 5.1 mEq/L   Chloride 101  96 - 112 mEq/L   CO2 24  19 - 32 mEq/L   Glucose, Bld 128 (*) 70 - 99 mg/dL   BUN 7  6 - 23 mg/dL   Creatinine, Ser 6.96  0.50 - 1.10 mg/dL   Calcium 8.5  8.4 - 29.5 mg/dL   GFR calc non Af Amer 63 (*) >90 mL/min   GFR calc Af Amer 73 (*) >90 mL/min  MAGNESIUM     Status: Normal   Collection Time   07/08/11  6:08 AM  Component Value Range   Magnesium 1.9  1.5 - 2.5 mg/dL   Dg Chest [facility] 1 View  07/07/2011  *RADIOLOGY REPORT*  Clinical Data: Fever.  PORTABLE CHEST - 1 VIEW   Comparison: Chest x-ray 07/01/2011.  Findings: Lung volumes are low.  Bibasilar opacities are favored to represent areas of subsegmental atelectasis, however, there is a more focal medial left lower lobe opacity that could represent sequela of aspiration or airspace consolidation from infection.  No pleural effusions.  Pulmonary venous congestion without frank pulmonary edema.  Mild cardiomegaly is unchanged.  Mediastinal contours are unremarkable.  IMPRESSION: 1.  Low lung volumes with probable bibasilar subsegmental atelectasis. 2.  Mild cardiomegaly.  Original Report Authenticated By: Florencia Reasons, M.D.    Assessment/Plan: Diagnosis: Right below knee amputation 1. Does the need for close, 24 hr/day medical supervision in concert with the patient's rehab needs make it unreasonable for this patient to be served in a less intensive setting? Yes 2. Co-Morbidities requiring supervision/potential complications: Diabetes, hypertension, wound care issues, peripheral neuropathy 3. Due to bladder management, bowel management, safety, skin/wound care, disease management, medication administration, pain management and patient education, does the patient require 24 hr/day rehab nursing? Yes 4. Does the patient require coordinated care of a physician, rehab nurse, PT (1-2 hrs/day, 5 days/week) and OT (1-2 hrs/day, 5 days/week) to address physical and functional deficits in the context of the above medical diagnosis(es)? Yes Addressing deficits in the following areas: balance, endurance, locomotion, strength, transferring, bowel/bladder control, bathing, dressing, feeding, grooming, toileting and psychosocial support 5. Can the patient actively participate in an intensive therapy program of at least 3 hrs of therapy per day at least 5 days per week? Yes 6. The potential for patient to make measurable gains while on inpatient rehab is excellent 7. Anticipated functional outcomes upon discharge from inpatient  rehab are modified independent with PT, modified independent with OT,  8. Estimated rehab length of stay to reach the above functional goals is: 1 week 9. Does the patient have adequate social supports to accommodate these discharge functional goals? Potentially 10. Anticipated D/C setting: Home 11. Anticipated post D/C treatments: HH therapy 12. Overall Rehab/Functional Prognosis: excellent  RECOMMENDATIONS: This patient's condition is appropriate for continued rehabilitative care in the following setting: CIR Patient has agreed to participate in recommended program. Yes Note that insurance prior authorization may be required for reimbursement for recommended care.  Comment: Patient was independent prior to arrival. She has 2 young children who family is helping with right now. She needs to be independent when she returns home.   Ranelle Oyster M.D. 07/08/2011

## 2011-07-08 NOTE — Clinical Social Work Psychosocial (Signed)
     Clinical Social Work Department BRIEF PSYCHOSOCIAL ASSESSMENT 07/08/2011  Patient:  [name], [name]     Account Number:  [id number]     Admit date:  07/01/2011  Clinical Social Worker:  Margaree Mackintosh  Date/Time:  07/08/2011 10:48 AM  Referred by:  Physician  Date Referred:  07/07/2011 Referred for  SNF Placement   Other Referral:   Interview type:  Patient Other interview type:    PSYCHOSOCIAL DATA Living Status:  FAMILY Admitted from facility:   Level of care:   Primary support name:  Patsy: [phone] Primary support relationship to patient:  PARENT Degree of support available:   Unknown.  Pt resides with her two children, aged 7 &9.    CURRENT CONCERNS Current Concerns  Post-Acute Placement   Other Concerns:    SOCIAL WORK ASSESSMENT / PLAN Clinical Social Worker recieved referral for potential SNF. Pt is currently being evaluated for potential CIR placement.  CSW reviewed chart and met with pt at bedside. CSW introduced self, explained role, and provided support. CSW provided opportunity for pt to process feelings.  Pt shared that she has "had a long journey" with regards to her health.  Pt's family resides in [place] and are currently watching her two children.  Pt shared grief related to her children being away from her, but acknowledged the importance of focusing on her health.  CSW reviewed policy that SNF be evaluated as a back up.  Pt processed quesitons/concerns.  Pt agreeable to a [facility] search, as a back up.  CSW explained that weekday CSW, Amy, will follow up with her to review bed offers.   Assessment/plan status:  Information/Referral to [facility] Other assessment/ plan:   Information/referral to community resources:   SNF    PATIENTS/FAMILYS RESPONSE TO PLAN OF CARE: Pt was pleasant and openly engaged.  Pt appeared honest in commnication.  Pt thanked CSW for intervention.

## 2011-07-08 NOTE — Progress Notes (Signed)
Subjective: 2 Days Post-Op Procedure(s) (LRB): IRRIGATION AND DEBRIDEMENT EXTREMITY (Right) Patient reports pain as mild.  Patient feels she is doing much better today Denies CP or SOB.  Voiding without difficulty. Positive flatus. Objective: Vital signs in last 24 hours: Temp:  [98.7 F (37.1 C)-102 F (38.9 C)] 99.7 F (37.6 C) (07/04 0733) Pulse Rate:  [89-102] 89  (07/04 0733) Resp:  [18-20] 18  (07/04 0733) BP: (134-178)/(62-84) 134/63 mmHg (07/04 0733) SpO2:  [91 %-96 %] 93 % (07/04 0733) Weight:  [99.973 kg (220 lb 6.4 oz)] 99.973 kg (220 lb 6.4 oz) (07/04 0733)  Intake/Output from previous day: 07/03 0701 - 07/04 0700 In: 480 [P.O.:480] Out: 1400 [Urine:1400] Intake/Output this shift:     Basename 07/08/11 0608 07/07/11 0650 07/06/11 0520  HGB 8.9* 11.2* 10.9*    Basename 07/08/11 0608 07/07/11 0650  WBC 8.0 9.0  RBC 3.20* 4.01  HCT 27.4* 34.6*  PLT 347 351    Basename 07/08/11 0608 07/07/11 0650  NA 136 136  K 3.5 3.7  CL 101 99  CO2 24 27  BUN 7 5*  CREATININE 1.06 0.95  GLUCOSE 128* 152*  CALCIUM 8.5 9.3   No results found for this basename: LABPT:2,INR:2 in the last 72 hours  Incision: dressing C/D/I Compartment soft  Assessment/Plan: 2 Days Post-Op Procedure(s) (LRB): IRRIGATION AND DEBRIDEMENT EXTREMITY (Right) Advance diet Up with therapy Plan for discharge tomorrow  Roma Schanz 07/08/2011, 9:57 AM

## 2011-07-09 ENCOUNTER — Inpatient Hospital Stay (HOSPITAL_COMMUNITY)
Admission: RE | Admit: 2011-07-09 | Discharge: 2011-07-16 | DRG: 945 | Disposition: A | Discharge status: Home Health | Payer: 59 | Source: Other Acute Inpatient Hospital | Attending: Physical Medicine & Rehabilitation | Admitting: Physical Medicine & Rehabilitation

## 2011-07-09 DIAGNOSIS — Z79899 Other long term (current) drug therapy: Secondary | ICD-10-CM

## 2011-07-09 DIAGNOSIS — Z5189 Encounter for other specified aftercare: Principal | ICD-10-CM

## 2011-07-09 DIAGNOSIS — I10 Essential (primary) hypertension: Secondary | ICD-10-CM

## 2011-07-09 DIAGNOSIS — A419 Sepsis, unspecified organism: Secondary | ICD-10-CM

## 2011-07-09 DIAGNOSIS — N289 Disorder of kidney and ureter, unspecified: Secondary | ICD-10-CM

## 2011-07-09 DIAGNOSIS — M129 Arthropathy, unspecified: Secondary | ICD-10-CM

## 2011-07-09 DIAGNOSIS — Z794 Long term (current) use of insulin: Secondary | ICD-10-CM

## 2011-07-09 DIAGNOSIS — Z8249 Family history of ischemic heart disease and other diseases of the circulatory system: Secondary | ICD-10-CM

## 2011-07-09 DIAGNOSIS — IMO0001 Reserved for inherently not codable concepts without codable children: Secondary | ICD-10-CM

## 2011-07-09 DIAGNOSIS — E1159 Type 2 diabetes mellitus with other circulatory complications: Secondary | ICD-10-CM

## 2011-07-09 DIAGNOSIS — D649 Anemia, unspecified: Secondary | ICD-10-CM

## 2011-07-09 DIAGNOSIS — L97509 Non-pressure chronic ulcer of other part of unspecified foot with unspecified severity: Secondary | ICD-10-CM

## 2011-07-09 DIAGNOSIS — E1165 Type 2 diabetes mellitus with hyperglycemia: Secondary | ICD-10-CM

## 2011-07-09 DIAGNOSIS — Z833 Family history of diabetes mellitus: Secondary | ICD-10-CM

## 2011-07-09 DIAGNOSIS — G589 Mononeuropathy, unspecified: Secondary | ICD-10-CM

## 2011-07-09 DIAGNOSIS — Z8673 Personal history of transient ischemic attack (TIA), and cerebral infarction without residual deficits: Secondary | ICD-10-CM

## 2011-07-09 DIAGNOSIS — L84 Corns and callosities: Secondary | ICD-10-CM

## 2011-07-09 DIAGNOSIS — E1169 Type 2 diabetes mellitus with other specified complication: Secondary | ICD-10-CM

## 2011-07-09 DIAGNOSIS — Z7982 Long term (current) use of aspirin: Secondary | ICD-10-CM

## 2011-07-09 DIAGNOSIS — S88119A Complete traumatic amputation at level between knee and ankle, unspecified lower leg, initial encounter: Secondary | ICD-10-CM

## 2011-07-09 DIAGNOSIS — K5900 Constipation, unspecified: Secondary | ICD-10-CM

## 2011-07-09 DIAGNOSIS — I96 Gangrene, not elsewhere classified: Secondary | ICD-10-CM

## 2011-07-09 DIAGNOSIS — E11621 Type 2 diabetes mellitus with foot ulcer: Secondary | ICD-10-CM | POA: Diagnosis present

## 2011-07-09 DIAGNOSIS — I70269 Atherosclerosis of native arteries of extremities with gangrene, unspecified extremity: Secondary | ICD-10-CM

## 2011-07-09 LAB — GLUCOSE, CAPILLARY
Glucose-Capillary: 75 mg/dL (ref 70–99)
Glucose-Capillary: 95 mg/dL (ref 70–99)
Glucose-Capillary: 206 mg/dL — ABNORMAL HIGH (ref 70–99)

## 2011-07-09 LAB — CBC
HCT: 29.2 % — ABNORMAL LOW (ref 36.0–46.0)
Hemoglobin: 9.4 g/dL — ABNORMAL LOW (ref 12.0–15.0)
MCH: 27.6 pg (ref 26.0–34.0)
MCHC: 32.2 g/dL (ref 30.0–36.0)
MCV: 85.6 fL (ref 78.0–100.0)
Platelets: 367 10*3/uL (ref 150–400)
RBC: 3.41 MIL/uL — ABNORMAL LOW (ref 3.87–5.11)
RDW: 15.8 % — ABNORMAL HIGH (ref 11.5–15.5)
WBC: 6.8 10*3/uL (ref 4.0–10.5)

## 2011-07-09 LAB — BASIC METABOLIC PANEL
BUN: 8 mg/dL (ref 6–23)
CO2: 25 mEq/L (ref 19–32)
Calcium: 8.7 mg/dL (ref 8.4–10.5)
Chloride: 104 mEq/L (ref 96–112)
Creatinine, Ser: 1.2 mg/dL — ABNORMAL HIGH (ref 0.50–1.10)
GFR calc Af Amer: 63 mL/min — ABNORMAL LOW (ref >90)
GFR calc non Af Amer: 54 mL/min — ABNORMAL LOW (ref >90)
Glucose, Bld: 95 mg/dL (ref 70–99)
Potassium: 3.6 mEq/L (ref 3.5–5.1)
Sodium: 140 mEq/L (ref 135–145)

## 2011-07-09 LAB — URINE CULTURE
Colony Count: NO GROWTH
Culture: NO GROWTH

## 2011-07-09 LAB — MAGNESIUM: Magnesium: 2.1 mg/dL (ref 1.5–2.5)

## 2011-07-09 LAB — VANCOMYCIN, TROUGH: Vancomycin Tr: 20 ug/mL (ref 10.0–20.0)

## 2011-07-09 MED ORDER — AMLODIPINE BESYLATE 10 MG PO TABS
10.0000 mg | ORAL_TABLET | Freq: Every day | ORAL | Status: DC
  Administered 2011-07-09: 10 mg via ORAL
  Filled 2011-07-09: qty 1

## 2011-07-09 MED ORDER — DOXYCYCLINE HYCLATE 100 MG PO TABS: 100.0000 mg | ORAL_TABLET | Freq: Two times a day (BID) | ORAL | Status: DC

## 2011-07-09 MED ORDER — INSULIN ASPART 100 UNIT/ML ~~LOC~~ SOLN
0.0000 [IU] | Freq: Three times a day (TID) | SUBCUTANEOUS | Status: DC
  Administered 2011-07-10: 2 [IU] via SUBCUTANEOUS
  Administered 2011-07-10: 3 [IU] via SUBCUTANEOUS
  Administered 2011-07-11 – 2011-07-15 (×9): 2 [IU] via SUBCUTANEOUS

## 2011-07-09 MED ORDER — SODIUM CHLORIDE 0.9 % IV SOLN
INTRAVENOUS | Status: DC
  Administered 2011-07-09: 13:00:00 via INTRAVENOUS

## 2011-07-09 MED ORDER — DOXYCYCLINE HYCLATE 100 MG PO TABS
100.0000 mg | ORAL_TABLET | Freq: Two times a day (BID) | ORAL | Status: DC
  Administered 2011-07-09: 100 mg via ORAL
  Filled 2011-07-09 (×2): qty 1

## 2011-07-09 MED ORDER — INSULIN ASPART 100 UNIT/ML ~~LOC~~ SOLN: 3.0000 [IU] | Freq: Three times a day (TID) | SUBCUTANEOUS | Status: DC

## 2011-07-09 MED ORDER — PROCHLORPERAZINE 25 MG RE SUPP
12.5000 mg | Freq: Four times a day (QID) | RECTAL | Status: DC | PRN
  Filled 2011-07-09: qty 1

## 2011-07-09 MED ORDER — FLEET ENEMA 7-19 GM/118ML RE ENEM
1.0000 | ENEMA | Freq: Once | RECTAL | Status: AC | PRN
  Filled 2011-07-09: qty 1

## 2011-07-09 MED ORDER — OXYCODONE HCL 5 MG PO TABS
10.0000 mg | ORAL_TABLET | ORAL | Status: DC | PRN
  Administered 2011-07-09 – 2011-07-16 (×19): 15 mg via ORAL
  Filled 2011-07-09 (×21): qty 3

## 2011-07-09 MED ORDER — ACETAMINOPHEN 325 MG PO TABS
325.0000 mg | ORAL_TABLET | ORAL | Status: DC | PRN
  Administered 2011-07-16: 650 mg via ORAL
  Filled 2011-07-09: qty 2

## 2011-07-09 MED ORDER — GUAIFENESIN-DM 100-10 MG/5ML PO SYRP: 5.0000 mL | ORAL_SOLUTION | Freq: Four times a day (QID) | ORAL | Status: DC | PRN

## 2011-07-09 MED ORDER — HYDROCODONE-ACETAMINOPHEN 5-325 MG PO TABS: 1.0000 | ORAL_TABLET | Freq: Four times a day (QID) | ORAL | Status: DC | PRN

## 2011-07-09 MED ORDER — INSULIN GLARGINE 100 UNIT/ML ~~LOC~~ SOLN: 3.0000 [IU] | Freq: Every day | SUBCUTANEOUS | Status: DC

## 2011-07-09 MED ORDER — FENTANYL 25 MCG/HR TD PT72: 1.0000 | MEDICATED_PATCH | TRANSDERMAL | Status: AC

## 2011-07-09 MED ORDER — INSULIN ASPART 100 UNIT/ML ~~LOC~~ SOLN: SUBCUTANEOUS | Status: DC

## 2011-07-09 MED ORDER — AMLODIPINE BESYLATE 10 MG PO TABS
10.0000 mg | ORAL_TABLET | Freq: Every day | ORAL | Status: DC
  Administered 2011-07-10 – 2011-07-16 (×7): 10 mg via ORAL
  Filled 2011-07-09 (×8): qty 1

## 2011-07-09 MED ORDER — AMLODIPINE BESYLATE 10 MG PO TABS: 10.0000 mg | ORAL_TABLET | Freq: Every day | ORAL | Status: DC

## 2011-07-09 MED ORDER — ALUM & MAG HYDROXIDE-SIMETH 200-200-20 MG/5ML PO SUSP: 30.0000 mL | ORAL | Status: DC | PRN

## 2011-07-09 MED ORDER — INSULIN ASPART 100 UNIT/ML ~~LOC~~ SOLN
3.0000 [IU] | Freq: Three times a day (TID) | SUBCUTANEOUS | Status: DC
  Administered 2011-07-10 – 2011-07-16 (×13): 3 [IU] via SUBCUTANEOUS

## 2011-07-09 MED ORDER — INSULIN GLARGINE 100 UNIT/ML ~~LOC~~ SOLN
5.0000 [IU] | Freq: Every day | SUBCUTANEOUS | Status: DC
  Administered 2011-07-09 – 2011-07-15 (×7): 5 [IU] via SUBCUTANEOUS

## 2011-07-09 MED ORDER — DOXYCYCLINE HYCLATE 100 MG PO TABS
100.0000 mg | ORAL_TABLET | Freq: Two times a day (BID) | ORAL | Status: AC
  Administered 2011-07-09 – 2011-07-14 (×10): 100 mg via ORAL
  Filled 2011-07-09 (×10): qty 1

## 2011-07-09 MED ORDER — ENOXAPARIN SODIUM 40 MG/0.4ML ~~LOC~~ SOLN
40.0000 mg | SUBCUTANEOUS | Status: DC
  Administered 2011-07-10 – 2011-07-16 (×7): 40 mg via SUBCUTANEOUS
  Filled 2011-07-09 (×8): qty 0.4

## 2011-07-09 MED ORDER — BISACODYL 10 MG RE SUPP: 10.0000 mg | Freq: Every day | RECTAL | Status: DC | PRN

## 2011-07-09 MED ORDER — PROCHLORPERAZINE EDISYLATE 5 MG/ML IJ SOLN
5.0000 mg | Freq: Four times a day (QID) | INTRAMUSCULAR | Status: DC | PRN
  Filled 2011-07-09: qty 2

## 2011-07-09 MED ORDER — PROCHLORPERAZINE MALEATE 5 MG PO TABS
5.0000 mg | ORAL_TABLET | Freq: Four times a day (QID) | ORAL | Status: DC | PRN
  Administered 2011-07-11 – 2011-07-15 (×2): 10 mg via ORAL
  Filled 2011-07-09 (×2): qty 2

## 2011-07-09 MED ORDER — FENTANYL 25 MCG/HR TD PT72
25.0000 ug | MEDICATED_PATCH | TRANSDERMAL | Status: DC
  Administered 2011-07-10 – 2011-07-16 (×3): 25 ug via TRANSDERMAL
  Filled 2011-07-09 (×3): qty 1

## 2011-07-09 MED ORDER — TRAZODONE HCL 50 MG PO TABS: 25.0000 mg | ORAL_TABLET | Freq: Every evening | ORAL | Status: DC | PRN

## 2011-07-09 MED ORDER — INSULIN ASPART 100 UNIT/ML ~~LOC~~ SOLN
0.0000 [IU] | Freq: Every day | SUBCUTANEOUS | Status: DC
  Administered 2011-07-09: 2 [IU] via SUBCUTANEOUS

## 2011-07-09 NOTE — Progress Notes (Signed)
Ptarrived to room 4010 from 4700 ; Alert and oriented x 4; VSS,C/o pain 8/10; phantom pain as well. Oriented to room and rehab process. 4700 RN reported pts last BM over 1 week ago. Refuses laxatives, "may take something tomorrow."  Report to evening shift. Carlean Purl

## 2011-07-09 NOTE — Progress Notes (Signed)
Subjective: 3 Days Post-Op Procedure(s) (LRB): IRRIGATION AND DEBRIDEMENT EXTREMITY (Right) Patient reports pain as mild.  Well controlled with oxycodone.  Objective: Vital signs in last 24 hours: Temp:  [98.3 F (36.8 C)-100.6 F (38.1 C)] 98.3 F (36.8 C) (07/05 0612) Pulse Rate:  [86-92] 87  (07/05 0612) Resp:  [18-19] 18  (07/05 0612) BP: (118-134)/(55-74) 130/74 mmHg (07/05 0612) SpO2:  [93 %-100 %] 100 % (07/05 0612) Weight:  [99.973 kg (220 lb 6.4 oz)-100 kg (220 lb 7.4 oz)] 100 kg (220 lb 7.4 oz) (07/05 0612)  Intake/Output from previous day: 07/04 0701 - 07/05 0700 In: 860 [P.O.:560; IV Piggyback:300] Out: 1325 [Urine:1325] Intake/Output this shift:     Basename 07/09/11 0540 07/08/11 0608 07/07/11 0650  HGB 9.4* 8.9* 11.2*    Basename 07/09/11 0540 07/08/11 0608  WBC 6.8 8.0  RBC 3.41* 3.20*  HCT 29.2* 27.4*  PLT 367 347    Basename 07/09/11 0540 07/08/11 0608  NA 140 136  K 3.6 3.5  CL 104 101  CO2 25 24  BUN 8 7  CREATININE 1.20* 1.06  GLUCOSE 95 128*  CALCIUM 8.7 8.5   No results found for this basename: LABPT:2,INR:2 in the last 72 hours  R leg wound VAC removed.  Incision intact and dry.  No signs of infection. Minimal swelling.Marland Kitchen  No erythema.  L foot with 1 cm ulcer.  No signs of infection.  Assessment/Plan: 3 Days Post-Op Procedure(s) (LRB): IRRIGATION AND DEBRIDEMENT EXTREMITY (Right) Up with therapy  Continue local wound care at left foot.  Dry dressing daily and as needed on R leg stump.  Stump shrinker sock from [employer] prior to discharge.  [name] 07/09/2011, 7:15 AM

## 2011-07-09 NOTE — Discharge Summary (Signed)
[facility]                                                                                   [name], is a 46 y.o. female  DOB 17-Feb-1965  MRN [id number].  Admission date:  07/01/2011  Discharge Date:  07/09/2011  Primary MD  Cheryl Saupe, MD  Admitting Physician  Cheryl Farber, MD  Admission Diagnosis  Gas gangrene [040.0] Sepsis [995.91] Anemia [285.9] Acute renal failure [584.9] Wound infection [958.3] Diabetic foot ulcer associated with type 2 diabetes mellitus [250.80, 707.9] Diabetes mellitus [250.00] infected foot foot ulcers STATUS POST BELOW KNEE AMPUTATION ON RIGHT SIDE  Discharge Diagnosis     Active Problems:  Diabetic foot ulcer associated with type 2 diabetes mellitus  Hypertension  Diabetes mellitus  Gas gangrene  Sepsis  Acute renal failure    Past Medical History  Diagnosis Date  . Diabetes mellitus   . Hypertension   . Arthritis   . Neuropathy   . Diabetic foot ulcer associated with type 2 diabetes mellitus   . Intracerebral hemorrhage As a teenager     States she had burst blood vessel as teenager, now with resultant minor visual field and hearing deficits    Past Surgical History  Procedure Date  . Cesarean section   . Amputation 11/24/2010    Procedure: AMPUTATION FOOT;  Surgeon: [name], MD;  Location: [facility] OR;  Service: Orthopedics;  Laterality: Right;  Right  FOOT TRANS METATARSAL AMPUTATION  . I&d extremity 11/24/2010    Procedure: IRRIGATION AND DEBRIDEMENT EXTREMITY;  Surgeon: [name], MD;  Location: [facility] OR;  Service: Orthopedics;  Laterality: Left;  Debriedment of left plantar ulcer and trimming of toenails  . Amputation 07/02/2011    Procedure: AMPUTATION BELOW KNEE;  Surgeon: [name], MD;  Location: [facility] OR;  Service: Orthopedics;  Laterality: Right;  . Application of wound vac 07/02/2011    Procedure: APPLICATION OF WOUND VAC;  Surgeon: [name], MD;  Location: [facility] OR;  Service:  Orthopedics;  Laterality: Right;  . I&d extremity 07/02/2011    Procedure: IRRIGATION AND DEBRIDEMENT EXTREMITY;  Surgeon: [name], MD;  Location: [facility] OR;  Service: Orthopedics;  Laterality: Left;  I & D of Left Foot  . I&d extremity 07/06/2011    Procedure: IRRIGATION AND DEBRIDEMENT EXTREMITY;  Surgeon: [name], MD;  Location: [facility] OR;  Service: Orthopedics;  Laterality: Right;  IRRIGATION/DEBRIDEMENT RIGHT BELOW KNEE AMPUTATION     Hospital Course See H&P, Labs, Consult and Test reports for all details in brief,     Patient is a 46 yo diabetic patient with diabetic foot ulcer and R.BKA on 11/24/2010. Presents to [facility] ED with pain, swelling, purulent malodorous discharge from the stump. Hypotensive, tachycardia and febrile upon presentation. Patient was initially on PCCM service. Patient was transferred to [facility] on June 30. Patient was transfused 2 units packed red blood cells on July 1 secondary to hemoglobin of 7.3.    1. Infected Diabetic foot ulcer associated with type 2 diabetes mellitus status post BKA 11-23-09 and then revision of the stump with I&D on 07-06-11 - initially presented with sepsis and was admitted  by PCCM, was placed on empiric IV vancomycin and Zosyn upon admission, had a repeat BKA stump revision and wound debridement with clean margins per orthopedic surgeon Dr. [name]. Has completed her IV antibiotics total 7 days of IV Vanco and Zosyn, I discussed her case with Dr. [name] personally today who suggested that her margins looked clean during surgery and her stump today looked excellent. Is to stop her IV antibiotics and place her on oral doxycycline for another week.  Patient's blood cultures have remained negative to date, she did spike fevers twice however she had no leukocytosis she had a nontoxic appearance no cough no dysuria. Likely her fevers were due to atelectasis which was noted on chest x-ray. Will request to please monitor clinically. Follow her final  cultures which should be finalized in the next 48 hours.   Incentive spirometry every hour while she is awake, if fevers reoccur infectious disease considered.     2.Diabetes mellitus   Good control. Continue sliding scale insulin + reduced pre-meal and Lantus dose as sugars slightly on the lower side. Accuchecks QAC-HS   CBG (last 3)   Basename  07/09/11 0654  07/08/11 2223  07/08/11 1655   GLUCAP  75  97  91        3. Acute renal failure - prerenal   her renal failure had resolved with IVF and PRBC. Slightly high Creat today (1.2) likely from ACE, stopped her ACE, IVF gently done today, repeat BMP in 2 days.      4. Hypertension  Patient with history of hypertension. Blood pressure is increasing. Switched her to Norvasc instead of Lotensin as creatinine slightly on the higher side, continue Pain control.      5.Anemia - AOCD + Post op blood loss  Patient was transfused 2 units of PRBC - satble H&H.       DVT Prophylaxis per CIR protocol    Procedures  1. Irrigation and excisional debridement of right leg wound, 15 cm x 10 cm x 2 cm, including skin, subcutaneous tissue, muscle, and bone.  2. Revision right below-knee amputation.  3. Application of wound VAC to right leg incision measuring 18 cm in length.      Consults  Ortho,PCCM, PT  Significant Tests:  See full reports for all details     Dg Chest Port 1 View  07/07/2011  *RADIOLOGY REPORT*  Clinical Data: Fever.  PORTABLE CHEST - 1 VIEW  Comparison: Chest x-ray 07/01/2011.  Findings: Lung volumes are low.  Bibasilar opacities are favored to represent areas of subsegmental atelectasis, however, there is a more focal medial left lower lobe opacity that could represent sequela of aspiration or airspace consolidation from infection.  No pleural effusions.  Pulmonary venous congestion without frank pulmonary edema.  Mild cardiomegaly is unchanged.  Mediastinal contours are unremarkable.  IMPRESSION: 1.  Low  lung volumes with probable bibasilar subsegmental atelectasis. 2.  Mild cardiomegaly.  Original Report Authenticated By: Florencia Reasons, M.D.   Dg Chest Port 1 View  07/01/2011  *RADIOLOGY REPORT*  Clinical Data: 46 year old female with fever chest pain increased heart rate.  PORTABLE CHEST - 1 VIEW  Comparison: 10/24/2010.  Findings: Portable semi upright AP view 2327 hours.  Low lung volumes similar the prior study.  Cardiomegaly is difficult to exclude. Other mediastinal contours are within normal limits. Visualized tracheal air column is within normal limits.  No pneumothorax or large pleural effusion.  Pulmonary vascular congestion without overt edema.  Patchy basilar opacity,  slightly greater on the left.  IMPRESSION: Low lung volumes with patchy bibasilar opacity which could reflect atelectasis or pneumonia.  PA and lateral radiographs recommended when possible.  Original Report Authenticated By: Harley Hallmark, M.D.   Dg Foot 2 Views Right  07/02/2011  *RADIOLOGY REPORT*  Clinical Data: Right foot wound infection with fever and swelling. History of diabetes.  RIGHT FOOT - 2 VIEW  Comparison: 11/22/2010  Findings: Interval trans tarsal amputation of the right foot. Soft tissue defects at the stump of the amputation.  This could represent irregularities associated with the wound or new ulceration.  There is amorphous subcutaneous gas in the soft tissues along the medial aspect of the midfoot at the level of the navicular.  This is consistent with soft tissue infection with gas forming organism.  Visualized bones appear grossly intact.  There is no obvious bone sclerosis, periosteal reaction, or bone destruction to suggest osteomyelitis.  If clinically indicated, MRI would be more sensitive for detection of bone changes.  IMPRESSION: Interval trans tarsal amputation of the right foot with the residual or recurrent defect over the wound.  There is soft tissue gas medial to the navicular consistent with  infection with gas forming organism.  No obvious changes of osteomyelitis.  Original Report Authenticated By: Marlon Pel, M.D.     Today   Subjective:   Jackolyn Geron today has no headache,no chest abdominal pain,no new weakness tingling or numbness, feels much better.    Objective:   Blood pressure 147/71, pulse 89, temperature 98.1 F (36.7 C), temperature source Oral, resp. rate 18, height 5\' 7"  (1.702 m), weight 100 kg (220 lb 7.4 oz), last menstrual period 06/17/2011, SpO2 97.00%.  Intake/Output Summary (Last 24 hours) at 07/09/11 1330 Last data filed at 07/09/11 1122  Gross per 24 hour  Intake    760 ml  Output   1025 ml  Net   -265 ml    Exam Awake Alert, Oriented *3, No new F.N deficits, Normal affect [place].AT,PERRAL Supple Neck,No JVD, No cervical lymphadenopathy appriciated.  Symmetrical Chest wall movement, Good air movement bilaterally, CTAB RRR,No Gallops,Rubs or new Murmurs, No Parasternal Heave +ve B.Sounds, Abd Soft, Non tender, No organomegaly appriciated, No rebound -guarding or rigidity. No Cyanosis, Clubbing or edema, No new Rash or bruise, Rt BKA stump in bandage  Data Review      Recent Results (from the past 240 hour(s))  CULTURE, BLOOD (ROUTINE X 2)     Status: Normal   Collection Time   07/01/11 10:15 PM      Component Value Range Status Comment   Specimen Description BLOOD LEFT ARM   Final    Special Requests BOTTLES DRAWN AEROBIC AND ANAEROBIC 5CC   Final    Culture  Setup Time 07/02/2011 11:15   Final    Culture NO GROWTH 5 DAYS   Final    Report Status 07/08/2011 FINAL   Final   CULTURE, BLOOD (ROUTINE X 2)     Status: Normal   Collection Time   07/01/11 10:30 PM      Component Value Range Status Comment   Specimen Description BLOOD LEFT HAND   Final    Special Requests BOTTLES DRAWN AEROBIC AND ANAEROBIC 5CC   Final    Culture  Setup Time 07/02/2011 11:15   Final    Culture NO GROWTH 5 DAYS   Final    Report Status 07/08/2011  FINAL   Final   MRSA PCR SCREENING  Status: Normal   Collection Time   07/02/11  3:00 AM      Component Value Range Status Comment   MRSA by PCR NEGATIVE  NEGATIVE Final   CULTURE, BLOOD (ROUTINE X 2)     Status: Normal (Preliminary result)   Collection Time   07/07/11  3:45 PM      Component Value Range Status Comment   Specimen Description BLOOD HAND RIGHT   Final    Special Requests BOTTLES DRAWN AEROBIC AND ANAEROBIC 10CC   Final    Culture  Setup Time 07/08/2011 02:10   Final    Culture     Final    Value:        BLOOD CULTURE RECEIVED NO GROWTH TO DATE CULTURE WILL BE HELD FOR 5 DAYS BEFORE ISSUING A FINAL NEGATIVE REPORT   Report Status PENDING   Incomplete   CULTURE, BLOOD (ROUTINE X 2)     Status: Normal (Preliminary result)   Collection Time   07/07/11  3:55 PM      Component Value Range Status Comment   Specimen Description BLOOD ARM RIGHT   Final    Special Requests BOTTLES DRAWN AEROBIC AND ANAEROBIC 10CC   Final    Culture  Setup Time 07/08/2011 02:10   Final    Culture     Final    Value:        BLOOD CULTURE RECEIVED NO GROWTH TO DATE CULTURE WILL BE HELD FOR 5 DAYS BEFORE ISSUING A FINAL NEGATIVE REPORT   Report Status PENDING   Incomplete      CBC w Diff: Lab Results  Component Value Date   WBC 6.8 07/09/2011   HGB 9.4* 07/09/2011   HCT 29.2* 07/09/2011   PLT 367 07/09/2011   LYMPHOPCT 18 12/30/2006   MONOPCT 7 12/30/2006   EOSPCT 2 12/30/2006   BASOPCT 0 12/30/2006    CMP: Lab Results  Component Value Date   NA 140 07/09/2011   K 3.6 07/09/2011   CL 104 07/09/2011   CO2 25 07/09/2011   BUN 8 07/09/2011   CREATININE 1.20* 07/09/2011   PROT 6.5 07/03/2011   ALBUMIN 2.1* 07/03/2011   BILITOT 0.2* 07/03/2011   ALKPHOS 66 07/03/2011   AST 20 07/03/2011   ALT 12 07/03/2011  .   Discharge Instructions       Follow-up Information    Follow up with FULP, CAMMIE, MD in 2 weeks.   Contact information:   [address] [place] [address] [phone]        Follow up with [name], MD. Schedule an appointment as soon as possible for a visit in 2 weeks.   Contact information:   [address] [place] [address] [phone]          Discharge Medications    [place], [place]  Home Medication Instructions [id number]   Printed on:07/09/11 1330  Medication Information                    ferrous sulfate 325 (65 FE) MG tablet Take 325 mg by mouth 2 (two) times daily with a meal.           insulin glargine (LANTUS) 100 UNIT/ML injection Inject 15 Units into the skin at bedtime.           aspirin EC 81 MG tablet Take 81 mg by mouth daily.           doxycycline (  VIBRA-TABS) 100 MG tablet Take 1 tablet (100 mg total) by mouth every 12 (twelve) hours.           amLODipine (NORVASC) 10 MG tablet Take 1 tablet (10 mg total) by mouth daily.           fentaNYL (DURAGESIC - DOSED MCG/HR) 25 MCG/HR Place 1 patch (25 mcg total) onto the skin every 3 (three) days.           HYDROcodone-acetaminophen (NORCO) 5-325 MG per tablet Take 1 tablet by mouth every 6 (six) hours as needed.           insulin glargine (LANTUS) 100 UNIT/ML injection Inject 3 Units into the skin at bedtime.           insulin aspart (NOVOLOG) 100 UNIT/ML injection Inject 3 Units into the skin 3 (three) times daily with meals.           insulin aspart (NOVOLOG) 100 UNIT/ML injection Before each meal 3 times a day, 140-199 - 2 units, 200-250 - 4 units, 251-299 - 6 units,  300-349 - 8 units,  350 or above 10 units.              Total Time in preparing paper work, data evaluation and todays exam - 35 minutes  Leroy Sea M.D on 07/09/2011 at 1:30 PM  [facility] Office  [phone]

## 2011-07-09 NOTE — Progress Notes (Signed)
Clinical Social Worker will sign off, as patient is being discharged to CIR.   Rozetta Nunnery MSW, [employer] (Covering Amy [place]) [phone]

## 2011-07-09 NOTE — Consult Note (Signed)
WOC consult Note Reason for Consult: eval wound of the left foot Wound type: no open wound at current time, callous present x 3 Evidence of previous open wound of the left plantar surface, she reports open area x 1 year. She has seen podiatry in the past for this area and has had offloading shoes at times. Currently the areas are all closed but are extremely hyperkeratotic at the plantar surface of the great toe, mid foot and lateral plantar surface edge.   Dressing procedure/placement/frequency:no dressings recommended at current time.  Emollient to leg and foot, avoiding between the toes.  Education: I have discussed with pt need to offload this foot for maintenance of the current status and that they may open again if she bears weight on them, however she is a recent amputation of the right extremity and this may lead to her need to certainly bear weight on this extremity.  I would suggest offloading shoe for the left foot and I have asked orthopedic MD to order as he feels appropriate.    Re consult if needed, will not follow at this time. Thanks  Shalynn Jorstad [employer], CWOCN [phone])

## 2011-07-09 NOTE — Progress Notes (Signed)
Rehab admissions - Evaluated for possible admission.  I spoke with patient.  She would like inpatient rehab admission.  I called the case in to [facility] and they are reviewing the case.  Hope to get an answer regarding inpatient rehab admission later today.  Call me for questions.  #[phone]

## 2011-07-09 NOTE — H&P (Signed)
Physical Medicine and Rehabilitation Admission H&P  Chief Complaint   Patient presents with   .  Sepsis due to right diabetic gangrenous wound.   :  HPI: [name] is a 46 y.o. female with history of DM with right transmet foot amputation for diabetic foot ulcer and poorly healing healing wound and refusing multiple attempts at hospital admission for treatment of infection. Admitted on 06/28 with significant increase in drainage with foul smell due to gangrenous changes, nausea and vomiting and fever. Patient noted to be septic with hypotension and tachycardia. Started on IV antibiotics and underwent R-BKA with placement of VAC on open wound and I & D of L- foot plantar diabetic ulcer on the same day by Dr. [name]. On 07/02, patient taken back to OR for I and D with R-BKA revision and VAC application. Has been transfused with 2 units PRBC for anemia. Renal insufficiency treated with IVF and d/c of ace. Evaluated by therapy team and patient felt to be a good CIR candidate.  Review of Systems  Eyes: Negative for blurred vision and double vision.  Respiratory: Negative for sputum production and shortness of breath.  Cardiovascular: Negative for chest pain and palpitations.  Gastrointestinal: Positive for constipation.  Genitourinary: Negative for urgency and frequency.  Musculoskeletal: Positive for back pain. Negative for myalgias.  Neurological: Negative for headaches.  Psychiatric/Behavioral: The patient is nervous/anxious.   Past Medical History   Diagnosis  Date   .  Diabetes mellitus    .  Hypertension    .  Arthritis    .  Neuropathy    .  Diabetic foot ulcer associated with type 2 diabetes mellitus    .  Intracerebral hemorrhage  As a teenager     States she had burst blood vessel as teenager, now with resultant minor visual field and hearing deficits    Past Surgical History   Procedure  Date   .  Cesarean section    .  Amputation  11/24/2010     Procedure: AMPUTATION  FOOT; Surgeon: [name], MD; Location: [facility] OR; Service: Orthopedics; Laterality: Right; Right FOOT TRANS METATARSAL AMPUTATION   .  I&d extremity  11/24/2010     Procedure: IRRIGATION AND DEBRIDEMENT EXTREMITY; Surgeon: [name], MD; Location: [facility] OR; Service: Orthopedics; Laterality: Left; Debriedment of left plantar ulcer and trimming of toenails   .  Amputation  07/02/2011     Procedure: AMPUTATION BELOW KNEE; Surgeon: [name], MD; Location: [facility] OR; Service: Orthopedics; Laterality: Right;   .  Application of wound vac  07/02/2011     Procedure: APPLICATION OF WOUND VAC; Surgeon: [name], MD; Location: [facility] OR; Service: Orthopedics; Laterality: Right;   .  I&d extremity  07/02/2011     Procedure: IRRIGATION AND DEBRIDEMENT EXTREMITY; Surgeon: [name], MD; Location: [facility] OR; Service: Orthopedics; Laterality: Left; I & D of Left Foot   .  I&d extremity  07/06/2011     Procedure: IRRIGATION AND DEBRIDEMENT EXTREMITY; Surgeon: [name], MD; Location: [facility] OR; Service: Orthopedics; Laterality: Right; IRRIGATION/DEBRIDEMENT RIGHT BELOW KNEE AMPUTATION    Family History   Problem  Relation  Age of Onset   .  Diabetes  Mother    .  Diabetes  Father    .  Heart disease  Maternal Grandmother     Social History: Widowed. (Husband passed away last 2022/11/12- 2 children 7 and 9 yr). Homemaker. She reports that she has never smoked. She does not have any smokeless tobacco history  on file. She reports that she has a glass of wine once a week. Does not use illicit drugs. Cousin in [place] taking care of children. (Has returned WC and hospital bed)  Allergies: No Known Allergies  Scheduled Meds:  .  amLODipine  10 mg  Oral  Daily   .  doxycycline  100 mg  Oral  Q12H   .  enoxaparin  40 mg  Subcutaneous  Q24H   .  fentaNYL  25 mcg  Transdermal  Q72H   .  insulin aspart  0-15 Units  Subcutaneous  TID WC   .  insulin aspart  0-5 Units  Subcutaneous  QHS   .  insulin aspart  3 Units  Subcutaneous  TID WC   .   insulin glargine  3 Units  Subcutaneous  QHS   .  DISCONTD: benazepril  10 mg  Oral  Daily   .  DISCONTD: insulin aspart  4 Units  Subcutaneous  TID WC   .  DISCONTD: insulin glargine  5 Units  Subcutaneous  QHS   .  DISCONTD: piperacillin-tazobactam (ZOSYN) IV  3.375 g  Intravenous  Q8H   .  DISCONTD: vancomycin  1,000 mg  Intravenous  Q12H    Medications Prior to Admission   Medication  Sig  Dispense  Refill   .  aspirin EC 81 MG tablet  Take 81 mg by mouth daily.     .  ferrous sulfate 325 (65 FE) MG tablet  Take 325 mg by mouth 2 (two) times daily with a meal.     .  insulin aspart (NOVOLOG) 100 UNIT/ML injection  Inject 0-15 Units into the skin 3 (three) times daily before meals. Inject 0-15 Units into the skin 3 (three) times daily with meals.  CBG 70 - 120: 0 units  CBG 121 - 150: 2 units  CBG 151 - 200: 3 units  CBG 201 - 250: 5 units  CBG 251 - 300: 8 units  CBG 301 - 350: 11 units  CBG 351 - 400: 15 units  If CBG>400 CALL MD     .  insulin glargine (LANTUS) 100 UNIT/ML injection  Inject 15 Units into the skin at bedtime.     .  naproxen sodium (ANAPROX) 220 MG tablet  Take 220 mg by mouth 2 (two) times daily with a meal.      Home:  Home Living  Lives With: Son  Available Help at Discharge: Family  Type of Home: House  Home Access: Stairs to enter  Secretary/administrator of Steps: 1  Entrance Stairs-Rails: None  Home Layout: Two level;Bed/bath upstairs  Alternate Level Stairs-Number of Steps: 14  Alternate Level Stairs-Rails: Can reach both;Left  Bathroom Shower/Tub: Tub/shower unit;Curtain  Bathroom Toilet: Standard  Bathroom Accessibility: No  Home Adaptive Equipment: Walker - rolling  Additional Comments: Loaned uncle BSC, rented hospital bed after last surgery as well as WC  Functional History:  Prior Function  Able to Take Stairs?: Yes  Driving: Yes  Vocation: On disability  Comments: Following last surgery, pt rented [facility] and hospital bed to live on main  level, kept RW and BSC, but loaned BCS out to uncle  Functional Status:  Mobility:  Bed Mobility  Bed Mobility: Supine to Sit;Sitting - Scoot to Edge of Bed  Supine to Sit: 4: Min guard;HOB flat  Sitting - Scoot to Edge of Bed: 5: Supervision  Sit to Supine: 4: Min assist;With rail;HOB flat  Scooting to HOB:  4: Min guard  Transfers  Transfers: Sit to Stand;Stand to Sit  Sit to Stand: 3: Mod assist;With upper extremity assist;From bed  Stand to Sit: 4: Min assist;With upper extremity assist;With armrests;To chair/3-in-1  Stand Pivot Transfers: 3: Mod assist  Ambulation/Gait  Ambulation/Gait Assistance: 3: Mod assist  Ambulation Distance (Feet): 4 Feet  Assistive device: Rolling walker  Ambulation/Gait Assistance Details: Verbal and physical assist to safely use RW. Patient able to use UE's to hop step on LLE.  Gait Pattern: Step-to pattern  Gait velocity: slow gait speed  Stairs: No  Wheelchair Mobility  Wheelchair Mobility: No  ADL:  ADL  Eating/Feeding: Performed;Set up  Where Assessed - Eating/Feeding: Chair  Grooming: Performed;Wash/dry hands;Set up  Where Assessed - Grooming: Supported sitting  Upper Body Bathing: Simulated;Set up  Where Assessed - Upper Body Bathing: Unsupported sitting  Lower Body Bathing: Simulated;Moderate assistance  Where Assessed - Lower Body Bathing: Unsupported sit to stand  Upper Body Dressing: Performed;Minimal assistance (donning gown)  Where Assessed - Upper Body Dressing: Unsupported sitting  Lower Body Dressing: Performed;Moderate assistance (pt donned left sock and assist with rt. LE)  Where Assessed - Lower Body Dressing: Unsupported sit to stand  Toilet Transfer: Simulated;Moderate assistance  Toilet Transfer Method: Stand pivot  Toilet Transfer Equipment: Other (comment) Nurse, children's)  Tub/Shower Transfer Method: Not assessed  Equipment Used: Rolling walker;Knee Immobilizer  Transfers/Ambulation Related to ADLs: Max verbal cues for hand  placement and technique  ADL Comments: Pt. educated on technique of transfer and hand placement to increase pt. safety.  Cognition:  Cognition  Arousal/Alertness: Awake/alert  Orientation Level: Oriented X4  Cognition  Overall Cognitive Status: Appears within functional limits for tasks assessed/performed  Arousal/Alertness: Awake/alert  Orientation Level: Oriented X4 / Intact  Behavior During Session: WFL for tasks performed  Cognition - Other Comments: Pt. with OCD tendencies and very particular about placement of items during session  Blood pressure 147/71, pulse 89, temperature 98.1 F (36.7 C), temperature source Oral, resp. rate 18, height 5\' 7"  (1.702 m), weight 100 kg (220 lb 7.4 oz), last menstrual period 06/17/2011, SpO2 97.00%.  Physical Exam  Nursing note and vitals reviewed.  Constitutional: She is oriented to person, place, and time. She appears well-developed and well-nourished.  HENT:  Head: Normocephalic and atraumatic. Oral mucosa pink and moist. Dentition fair Eyes: Pupils are equal, round, and reactive to light.  Neck: Normal range of motion.  Cardiovascular: Normal rate and regular rhythm. No murmurs, rubs, or gallops Pulmonary/Chest: Effort normal and breath sounds normal. No wheezes, rales, or rhonchi Abdominal: Soft. Bowel sounds positive, non-tender. Musculoskeletal:  R-BKA with dry dressing. Patient extremely anxious regarding her wound--did not want dressing changed again. Left plantar surface with callus with soft central area --no drainage or tenderness.  Neurological: She is alert and oriented to person, place, and time. Strength grossly 5/5 in the upper ext. RLE 3/5. LLE 4 to 4+/5. Sensory exam decreased in stocking glove distribution in intact limbs. Skin: Skin is warm and dry.  Psychiatric: Her speech is normal. Judgment and thought content normal. Her mood appears anxious. Cognition and memory are normal.   CBC Status: Abnormal    Collection Time     07/08/11 6:08 AM   Component  Value  Range  Comment    WBC  8.0  4.0 - 10.5 K/uL  WHITE COUNT CONFIRMED ON SMEAR    RBC  3.20 (*)  3.87 - 5.11 MIL/uL     Hemoglobin  8.9 (*)  12.0 -  15.0 g/dL  REPEATED TO VERIFY    HCT  27.4 (*)  36.0 - 46.0 %     MCV  85.6  78.0 - 100.0 fL     MCH  27.8  26.0 - 34.0 pg     MCHC  32.5  30.0 - 36.0 g/dL     RDW  16.1 (*)  09.6 - 15.5 %     Platelets  347  150 - 400 K/uL  PLATELET COUNT CONFIRMED BY SMEAR   BASIC METABOLIC PANEL Status: Abnormal    Collection Time    07/08/11 6:08 AM   Component  Value  Range  Comment    Sodium  136  135 - 145 mEq/L     Potassium  3.5  3.5 - 5.1 mEq/L     Chloride  101  96 - 112 mEq/L     CO2  24  19 - 32 mEq/L     Glucose, Bld  128 (*)  70 - 99 mg/dL     BUN  7  6 - 23 mg/dL     Creatinine, Ser  0.45  0.50 - 1.10 mg/dL     Calcium  8.5  8.4 - 10.5 mg/dL     GFR calc non Af Amer  63 (*)  >90 mL/min     GFR calc Af Amer  73 (*)  >90 mL/min    MAGNESIUM Status: Normal    Collection Time    07/08/11 6:08 AM   Component  Value  Range  Comment    Magnesium  1.9  1.5 - 2.5 mg/dL    GLUCOSE, CAPILLARY Status: Abnormal    Collection Time    07/08/11 6:18 AM   Component  Value  Range  Comment    Glucose-Capillary  137 (*)  70 - 99 mg/dL     Comment 1  Notify RN      Comment 2  Documented in Chart     GLUCOSE, CAPILLARY Status: Abnormal    Collection Time    07/08/11 10:56 AM   Component  Value  Range  Comment    Glucose-Capillary  102 (*)  70 - 99 mg/dL    GLUCOSE, CAPILLARY Status: Normal    Collection Time    07/08/11 4:55 PM   Component  Value  Range  Comment    Glucose-Capillary  91  70 - 99 mg/dL    GLUCOSE, CAPILLARY Status: Normal    Collection Time    07/08/11 10:23 PM   Component  Value  Range  Comment    Glucose-Capillary  97  70 - 99 mg/dL    CBC Status: Abnormal    Collection Time    07/09/11 5:40 AM   Component  Value  Range  Comment    WBC  6.8  4.0 - 10.5 K/uL     RBC  3.41 (*)  3.87 - 5.11 MIL/uL      Hemoglobin  9.4 (*)  12.0 - 15.0 g/dL     HCT  40.9 (*)  81.1 - 46.0 %     MCV  85.6  78.0 - 100.0 fL     MCH  27.6  26.0 - 34.0 pg     MCHC  32.2  30.0 - 36.0 g/dL     RDW  91.4 (*)  78.2 - 15.5 %     Platelets  367  150 - 400 K/uL    MAGNESIUM Status: Normal    Collection Time  07/09/11 5:40 AM   Component  Value  Range  Comment    Magnesium  2.1  1.5 - 2.5 mg/dL    BASIC METABOLIC PANEL Status: Abnormal    Collection Time    07/09/11 5:40 AM   Component  Value  Range  Comment    Sodium  140  135 - 145 mEq/L     Potassium  3.6  3.5 - 5.1 mEq/L     Chloride  104  96 - 112 mEq/L     CO2  25  19 - 32 mEq/L     Glucose, Bld  95  70 - 99 mg/dL     BUN  8  6 - 23 mg/dL     Creatinine, Ser  1.61 (*)  0.50 - 1.10 mg/dL     Calcium  8.7  8.4 - 10.5 mg/dL     GFR calc non Af Amer  54 (*)  >90 mL/min     GFR calc Af Amer  63 (*)  >90 mL/min    GLUCOSE, CAPILLARY Status: Normal    Collection Time    07/09/11 6:54 AM   Component  Value  Range  Comment    Glucose-Capillary  75  70 - 99 mg/dL    VANCOMYCIN, TROUGH Status: Normal    Collection Time    07/09/11 10:00 AM   Component  Value  Range  Comment    Vancomycin Tr  20.0  10.0 - 20.0 ug/mL    Dg Chest Port 1 View  07/07/2011 *RADIOLOGY REPORT* Clinical Data: Fever. PORTABLE CHEST - 1 VIEW Comparison: Chest x-ray 07/01/2011. Findings: Lung volumes are low. Bibasilar opacities are favored to represent areas of subsegmental atelectasis, however, there is a more focal medial left lower lobe opacity that could represent sequela of aspiration or airspace consolidation from infection. No pleural effusions. Pulmonary venous congestion without frank pulmonary edema. Mild cardiomegaly is unchanged. Mediastinal contours are unremarkable. IMPRESSION: 1. Low lung volumes with probable bibasilar subsegmental atelectasis. 2. Mild cardiomegaly. Original Report Authenticated By: Florencia Reasons, M.D.   Post Admission Physician Evaluation:  1. Functional  deficits secondary to right BKA 2. Patient is admitted to receive collaborative, interdisciplinary care between the physiatrist, rehab nursing staff, and therapy team. 3. Patient's level of medical complexity and substantial therapy needs in context of that medical necessity cannot be provided at a lesser intensity of care such as a SNF. 4. Patient has experienced substantial functional loss from his/her baseline which was documented above under the "Functional History" and "Functional Status" headings. Judging by the patient's diagnosis, physical exam, and functional history, the patient has potential for functional progress which will result in measurable gains while on inpatient rehab. These gains will be of substantial and practical use upon discharge in facilitating mobility and self-care at the household level. 5. Physiatrist will provide 24 hour management of medical needs as well as oversight of the therapy plan/treatment and provide guidance as appropriate regarding the interaction of the two. 6. 24 hour rehab nursing will assist with bladder management, bowel management, safety, skin/wound care, disease management, medication administration, pain management and patient education and help integrate therapy concepts, techniques,education, etc. 7. PT will assess and treat for: Lower extremity strength, range of motion, functional mobility, pre-prosthetic education, safety and pain management. Goals are: Modified independent. 8. OT will assess and treat for: Upper extremity strength, ADLs, functional mobility, safety. Goals are: Modified independent. 9. SLP will assess and treat for: not app 10. Case  Management and Social Worker will assess and treat for psychological issues and discharge planning. 11. Team conference will be held weekly to assess progress toward goals and to determine barriers to discharge. 12. Patient will receive at least 3 hours of therapy per day at least 5 days per  week. 13. ELOS: One week Prognosis: excellent Medical Problem List and Plan:  1. DVT Prophylaxis/Anticoagulation: Pharmaceutical: Lovenox  2. Pain Management: prn oxycodone effective.  3. Mood: Seems a little withdrawn and anxious. Educated regarding need for complete healing and that prosthesis likely couple of months down the road. Safety reiterated.  4. Neuropsych: This patient is capable of making decisions on his/her own behalf.  5. Left foot ulcer: healed. Pressure relief measures to prevent reopening/breakdown. The patient has custom insoles and shoes through [employer]. She should wear these with mobility attempts. 6. DM type 2: monitor with AC/HS cbg checks. Continue lantus  7. Fevers: resolving. IV antibiotics changed to po doxycycline today. Continue to monitor temp curve. Recheck CBC 07/08. Monitor wounds for drainage.  8. HTN: ACE discontinued due to renal insufficiency. Continue norvasc. Continue to monitor with bid checks.  9. Anemia: Continue iron supplement.  10. Constipation: Will add miralax as reports no BM since Monday? None recorded  Ivory Broad, MD 07/09/11

## 2011-07-09 NOTE — Progress Notes (Signed)
Physical Therapy Treatment Patient Details Name: [name] MRN: [id number] DOB: 1965/09/23 Today's Date: 07/09/2011 Time: 6213-0865 PT Time Calculation (min): 18 min  PT Assessment / Plan / Recommendation Comments on Treatment Session  Patient making slow progress with mobility.  Able to take steps today.  Agree with need for CIR to maximize independence prior to discharge home.    Follow Up Recommendations  Inpatient Rehab    Barriers to Discharge        Equipment Recommendations  Defer to next venue    Recommendations for Other Services Rehab consult  Frequency Min 3X/week   Plan Discharge plan remains appropriate;Frequency remains appropriate    Precautions / Restrictions Precautions Precautions: Fall Restrictions Weight Bearing Restrictions: Yes RLE Weight Bearing: Non weight bearing Other Position/Activity Restrictions: R KI       Mobility  Bed Mobility Bed Mobility: Supine to Sit;Sitting - Scoot to Edge of Bed Supine to Sit: 4: Min guard;HOB flat Sitting - Scoot to Edge of Bed: 5: Supervision Details for Bed Mobility Assistance: Cues for technique to move to sitting. Transfers Transfers: Sit to Stand;Stand to Sit Sit to Stand: 3: Mod assist;With upper extremity assist;From bed Stand to Sit: 4: Min assist;With upper extremity assist;With armrests;To chair/3-in-1 Details for Transfer Assistance: Verbal and tactile cues for safe hand placement.  Cues to scoot to EOB before attempting to stand. Ambulation/Gait Ambulation/Gait Assistance: 3: Mod assist Ambulation Distance (Feet): 4 Feet Assistive device: Rolling walker Ambulation/Gait Assistance Details: Verbal and physical assist to safely use RW.  Patient able to use UE's to hop step on LLE. Gait Pattern: Step-to pattern Gait velocity: slow gait speed      PT Goals Acute Rehab PT Goals PT Goal: Supine/Side to Sit - Progress: Progressing toward goal PT Transfer Goal: Bed to Chair/Chair to Bed - Progress:  Progressing toward goal PT Goal: Ambulate - Progress: Progressing toward goal  Visit Information  Last PT Received On: 07/09/11 Assistance Needed: +1    Subjective Data  Subjective: "I'm not sure how to put this brace (immobilizer) on."  Instructed patient on donning immobilizer for RLE.   Cognition  Overall Cognitive Status: Appears within functional limits for tasks assessed/performed Arousal/Alertness: Awake/alert Orientation Level: Oriented X4 / Intact Behavior During Session: WFL for tasks performed Cognition - Other Comments: Pt. with OCD tendencies and very particular about placement of items during session    Balance     End of Session PT - End of Session Equipment Utilized During Treatment: Gait belt;Right knee immobilizer Activity Tolerance: Patient tolerated treatment well Patient left: in chair;with call bell/phone within reach;with nursing in room Nurse Communication: Mobility status   GP     Vena Austria 07/09/2011, 12:12 PM [name] Hurt. Renaldo Fiddler, [facility] Acute Rehab Services Pager [phone]

## 2011-07-09 NOTE — PMR Pre-admission (Signed)
PMR Admission Coordinator Pre-Admission Assessment  Patient: [name] is an 46 y.o., female MRN: [id number] DOB: 09-06-1965 Height: 5\' 7"  (170.2 cm) Weight: 100 kg (220 lb 7.4 oz)  Insurance Information HMO:      PPO:       PCP:       IPA:       80/20:       OTHER: Group # [id number]  PRIMARY: [employer] Choice Plus      Policy#: [id number]      Subscriber: [name] CM Name:  Pennelope Bracken    Phone#: [phone]     Fax#: [phone] Pre-Cert#: [id number]      Employer: Not employed, insurance is [place] and paid up through 08/04/11 Benefits:  Phone #: [phone]     Name: Etta Grandchild. Date: 01/05/11     Deduct: $350(met)      Out of Pocket Max: $3000(met $493)      Life Max: Annual max $2 mil CIR: w/auth 80%      SNF: W/auth 80%  100 days max Outpatient: 80%  Reviewed after 30 visits     Co-Pay: 20% Home Health: 80%   15 visits at 100%, then 100 visits total     Co-Pay: 20% DME: 80%     Co-Pay: 20% Providers: in network  Emergency Contact Information Contact Information    Name Relation Home Work Mobile   Glass,Patsy Mother [phone]  [phone]     Current Medical History  Patient Admitting Diagnosis:  R BKA  History of Present Illness:  A 46 y.o. female with history of DM with right transmet foot amputation for diabetic foot ulcer and poorly healing healing wound and refusing multiple attempts at hospital admission for treatment of infection. Admitted on 06/28 with significant increase in drainage with foul smell due to gangrenous changes, nausea and vomiting and fever. Patient noted to be septic with hypotension and tachycardia. Started on IV antibiotics and underwent R-BKA with placement of VAC on open wound and I & D of L- foot plantar diabetic ulcer on the same day by Dr. [name]. On 07/02, patient taken back to OR for I and D with R-BKA revision and VAC application. Has been transfused with 2 units PRBC for anemia.   Past Medical History  Past Medical History  Diagnosis  Date  . Diabetes mellitus   . Hypertension   . Arthritis   . Neuropathy   . Diabetic foot ulcer associated with type 2 diabetes mellitus   . Intracerebral hemorrhage As a teenager     States she had burst blood vessel as teenager, now with resultant minor visual field and hearing deficits    Family History  family history includes Diabetes in her father and mother and Heart disease in her maternal grandmother.  Prior Rehab/Hospitalizations:  No previous rehab admissions.   Current Medications  Current facility-administered medications:0.9 %  sodium chloride infusion, , Intravenous, Continuous, Alinda Money, MD, Last Rate: 50 mL/hr at 07/09/11 0156;  0.9 %  sodium chloride infusion, , Intravenous, Continuous, Leroy Sea, MD, Last Rate: 100 mL/hr at 07/08/11 0933, 100 mL/hr at 07/08/11 0933;  acetaminophen (TYLENOL) tablet 650 mg, 650 mg, Oral, Q6H PRN, Zigmund Gottron, MD, 650 mg at 07/08/11 2216 amLODipine (NORVASC) tablet 10 mg, 10 mg, Oral, Daily, Leroy Sea, MD, 10 mg at 07/09/11 1137;  doxycycline (VIBRA-TABS) tablet 100 mg, 100 mg, Oral, Q12H, Leroy Sea, MD, 100 mg at 07/09/11 1230;  enoxaparin (LOVENOX) injection  40 mg, 40 mg, Subcutaneous, Q24H, [name], MD, 40 mg at 07/09/11 0750;  fentaNYL (DURAGESIC - dosed mcg/hr) patch 25 mcg, 25 mcg, Transdermal, Q72H, Leroy Sea, MD, 25 mcg at 07/07/11 1318 hydrALAZINE (APRESOLINE) injection 5 mg, 5 mg, Intravenous, Q4H PRN, Caroline More, NP;  HYDROcodone-acetaminophen (NORCO) 5-325 MG per tablet 1 tablet, 1 tablet, Oral, Q6H PRN, Leroy Sea, MD;  HYDROmorphone (DILAUDID) injection 1 mg, 1 mg, Subcutaneous, Q2H PRN, Leroy Sea, MD;  insulin aspart (novoLOG) injection 0-15 Units, 0-15 Units, Subcutaneous, TID WC, Merwyn Katos, MD, 2 Units at 07/08/11 0650 insulin aspart (novoLOG) injection 0-5 Units, 0-5 Units, Subcutaneous, QHS, Merwyn Katos, MD;  insulin aspart (novoLOG) injection 3 Units, 3  Units, Subcutaneous, TID WC, Leroy Sea, MD;  insulin glargine (LANTUS) injection 3 Units, 3 Units, Subcutaneous, QHS, Leroy Sea, MD;  metoCLOPramide (REGLAN) injection 5-10 mg, 5-10 mg, Intravenous, Q8H PRN, [name], MD, 10 mg at 07/05/11 0706 ondansetron [facility] injection 4 mg, 4 mg, Intravenous, Q6H PRN, [name], MD, 4 mg at 07/05/11 0304;  ondansetron [facility]) tablet 4 mg, 4 mg, Oral, Q6H PRN, [name], MD, 4 mg at 07/07/11 1610;  oxyCODONE (Oxy IR/ROXICODONE) immediate release tablet 10-15 mg, 10-15 mg, Oral, Q3H PRN, Alinda Money, MD, 15 mg at 07/09/11 0750;  DISCONTD: benazepril (LOTENSIN) tablet 10 mg, 10 mg, Oral, Daily, Alinda Money, MD, 10 mg at 07/08/11 9604 DISCONTD: insulin aspart (novoLOG) injection 4 Units, 4 Units, Subcutaneous, TID WC, Merwyn Katos, MD, 4 Units at 07/08/11 1703;  DISCONTD: insulin glargine (LANTUS) injection 5 Units, 5 Units, Subcutaneous, QHS, Alinda Money, MD, 5 Units at 07/08/11 2226;  DISCONTD: piperacillin-tazobactam (ZOSYN) IVPB 3.375 g, 3.375 g, Intravenous, Q8H, Alinda Money, MD, 3.375 g at 07/09/11 0421 DISCONTD: vancomycin (VANCOCIN) IVPB 1000 mg/200 mL premix, 1,000 mg, Intravenous, Q12H, April K Palumbo-Rasch, MD, 1,000 mg at 07/08/11 2217  Patients Current Diet: Carb Control  Precautions / Restrictions Precautions Precautions: Fall Restrictions Weight Bearing Restrictions: Yes RLE Weight Bearing: Non weight bearing Other Position/Activity Restrictions: R KI   Prior Activity Level Limited Community (1-2x/wk): Went out 1-2 X a week since problems started 11/12  Home Assistive Devices / Equipment Home Assistive Devices/Equipment: Brace (specify type);Other (Comment) (Right leg brace, BKA 07/03/11) Home Adaptive Equipment: Walker - rolling  Prior Functional Level Prior Function Level of Independence: Independent Able to Take Stairs?: Yes Driving: Yes Vocation: On disability Comments:   Following last surgery,  pt rented [facility] and hospital bed to live on main level, kept RW and BSC, but loaned BCS out to uncle   Current Functional Level Cognition  Arousal/Alertness: Awake/alert Overall Cognitive Status: Appears within functional limits for tasks assessed/performed Orientation Level: Oriented X4 Cognition - Other Comments: Pt. with OCD tendencies and very particular about placement of items during session    Extremity Assessment (includes Sensation/Coordination)  RUE ROM/Strength/Tone: Within functional levels RUE Sensation: WFL - Light Touch RUE Coordination: WFL - gross/fine motor  RLE ROM/Strength/Tone: Deficits;Unable to fully assess;Due to pain RLE ROM/Strength/Tone Deficits: Painful with mobitliy, but able to SLR     ADLs  Eating/Feeding: Performed;Set up Where Assessed - Eating/Feeding: Chair Grooming: Performed;Wash/dry hands;Set up Where Assessed - Grooming: Supported sitting Upper Body Bathing: Simulated;Set up Where Assessed - Upper Body Bathing: Unsupported sitting Lower Body Bathing: Simulated;Moderate assistance Where Assessed - Lower Body Bathing: Unsupported sit to stand Upper Body Dressing: Performed;Minimal assistance (donning gown) Where Assessed - Upper Body Dressing:  Unsupported sitting Lower Body Dressing: Performed;Moderate assistance (pt donned left sock and assist with rt. LE) Where Assessed - Lower Body Dressing: Unsupported sit to stand Toilet Transfer: Simulated;Moderate assistance Toilet Transfer Method: Stand pivot Toilet Transfer Equipment: Other (comment) Nurse, children's) Toileting - Clothing Manipulation and Hygiene: Simulated;Minimal assistance Where Assessed - Toileting Clothing Manipulation and Hygiene: Sit to stand from 3-in-1 or toilet Tub/Shower Transfer Method: Not assessed Equipment Used: Rolling walker;Knee Immobilizer Transfers/Ambulation Related to ADLs: Max verbal cues for hand placement and technique ADL Comments: Pt. educated on technique of  transfer and hand placement to increase pt. safety.     Mobility  Bed Mobility: Supine to Sit;Sitting - Scoot to Edge of Bed Supine to Sit: 4: Min guard;HOB flat Sitting - Scoot to [place] of Bed: 5: Supervision Sit to Supine: 4: Min assist;With rail;HOB flat Scooting to HOB: 4: Min guard    Transfers  Transfers: Sit to Stand;Stand to Sit Sit to Stand: 3: Mod assist;With upper extremity assist;From bed Stand to Sit: 4: Min assist;With upper extremity assist;With armrests;To chair/3-in-1 Stand Pivot Transfers: 3: Mod assist    Ambulation / Gait / Stairs / Wheelchair Mobility  Ambulation/Gait Ambulation/Gait Assistance: 3: Mod assist Ambulation Distance (Feet): 4 Feet Assistive device: Rolling walker Ambulation/Gait Assistance Details: Verbal and physical assist to safely use RW.  Patient able to use UE's to hop step on LLE. Gait Pattern: Step-to pattern Gait velocity: slow gait speed Stairs: No Wheelchair Mobility Wheelchair Mobility: No    Posture / Balance Static Sitting Balance Static Sitting - Balance Support: Right upper extremity supported;Left upper extremity supported;Feet supported Static Sitting - Level of Assistance: 5: Stand by assistance Static Sitting - Comment/# of Minutes: Sat EOB x29min during equipment set-up Dynamic Sitting Balance Dynamic Sitting - Balance Support: Left upper extremity supported;Right upper extremity supported;During functional activity Dynamic Sitting - Level of Assistance: 4: Min assist Dynamic Sitting - Balance Activities: Lateral lean/weight shifting;Forward lean/weight shifting;Reaching for objects Static Standing Balance Static Standing - Balance Support: Right upper extremity supported;During functional activity Static Standing - Level of Assistance: 4: Min assist Static Standing - Comment/# of Minutes: Pt was able to stand at RW with only RUE support to perform personal hygiene.      Previous Home Environment Living Arrangements:  Children Lives With: Son Available Help at Discharge: Family Type of Home: House Home Layout: Two level;Bed/bath upstairs Alternate Level Stairs-Rails: Can reach both;Left Alternate Level Stairs-Number of Steps: 14 Home Access: Stairs to enter Entrance Stairs-Rails: None Entrance Stairs-Number of Steps: 1 Bathroom Shower/Tub: Counselling psychologist: No Home Care Services: No Additional Comments: Loaned uncle BSC, rented hospital bed after last surgery as well as WC  Discharge Living Setting Plans for Discharge Living Setting: Patient's home;House;Lives with (comment) (Lives with children 7 and 9 yr olds.) Type of Home at Discharge: House Discharge Home Layout: Two level (Had to stay of main level before with bed and BSC.) Alternate Level Stairs-Number of Steps: 14 Discharge Home Access: Stairs to enter Entrance Stairs-Number of Steps: 1 step front and 1/2 step garage entry Do you have any problems obtaining your medications?: No  Social/Family/Support Systems Patient Roles: Parent Contact Information: Dedra Skeens - mother (h) [phone] (c) [phone] Anticipated Caregiver: self Ability/Limitations of Caregiver: No caregiver available Caregiver Availability: Other (Comment) (No caregiver.) Discharge Plan Discussed with Primary Caregiver: Yes Is Caregiver In Agreement with Plan?: Yes Does Caregiver/Family have Issues with Lodging/Transportation while Pt is in Rehab?: No  Goals/Additional Needs Patient/Family Goal for  Rehab: PT/OT mod I goals, no ST needs Expected length of stay: 1 week Cultural Considerations: Christian Dietary Needs: Carb Mod Med Cal diabetic diet Equipment Needs: TBD Pt/Family Agrees to Admission and willing to participate: Yes Program Orientation Provided & Reviewed with Pt/Caregiver Including Roles  & Responsibilities: Yes  Patient Condition: This patient's condition remains as documented in the  Consult dated 07/08/11, in which the Rehabilitation Physician determined and documented that the patient's condition is appropriate for intensive rehabilitative care in an inpatient rehabilitation facility.  Preadmission Screen Completed By:  Trish Mage, 07/09/2011 12:38 PM ______________________________________________________________________   Discussed status with Dr. Riley Kill on 07/09/11 at 1323 and received telephone approval for admission today.  Admission Coordinator:  Trish Mage, time1323/Date07/05/13

## 2011-07-09 NOTE — Progress Notes (Signed)
[facility]                                                                                Patient Demographics  [name], is a 46 y.o. female  ZOX:[id number]  WJX:[id number]  DOB - 1965-07-11  Admit date - 07/01/2011  Admitting Physician [name], MD  Outpatient Primary MD for the patient is FULP, CAMMIE, MD  LOS - 8   Chief Complaint  Patient presents with  . Wound Infection        Subjective:   [name] today has, No headache, No chest pain, No abdominal pain - No Nausea, No new weakness tingling or numbness, No Cough - SOB.  Some postop right leg stump pain.   Objective:   Filed Vitals:   07/08/11 0733 07/08/11 1415 07/08/11 2127 07/09/11 0612  BP: 134/63 118/55 129/55 130/74  Pulse: 89 86 92 87  Temp: 99.7 F (37.6 C) 98.9 F (37.2 C) 100.6 F (38.1 C) 98.3 F (36.8 C)  TempSrc: Oral Oral Oral Oral  Resp: 18 19 18 18   Height:      Weight: 99.973 kg (220 lb 6.4 oz)   100 kg (220 lb 7.4 oz)  SpO2: 93% 97% 95% 100%    Wt Readings from Last 3 Encounters:  07/09/11 100 kg (220 lb 7.4 oz)  07/09/11 100 kg (220 lb 7.4 oz)  07/09/11 100 kg (220 lb 7.4 oz)     Intake/Output Summary (Last 24 hours) at 07/09/11 1033 Last data filed at 07/09/11 0752  Gross per 24 hour  Intake    760 ml  Output   1450 ml  Net   -690 ml    Exam Awake Alert, Oriented *3, No new F.N deficits, Normal affect [name],PERRAL Supple Neck,No JVD, No cervical lymphadenopathy appriciated.  Symmetrical Chest wall movement, Good air movement bilaterally, CTAB RRR,No Gallops,Rubs or new Murmurs, No Parasternal Heave +ve B.Sounds, Abd Soft, Non tender, No organomegaly appriciated, No rebound -guarding or rigidity. No Cyanosis, Clubbing or edema, No new Rash or bruise, right leg stump in bandage  Data Review  CBC  Lab 07/09/11 0540 07/08/11 0608 07/07/11 0650 07/06/11 0520 07/05/11 0420  WBC 6.8 8.0 9.0 7.1 5.8  HGB 9.4* 8.9* 11.2* 10.9*  7.3*  HCT 29.2* 27.4* 34.6* 33.1* 22.8*  PLT 367 347 351 323 236  MCV 85.6 85.6 86.3 84.2 84.4  MCH 27.6 27.8 27.9 27.7 27.0  MCHC 32.2 32.5 32.4 32.9 32.0  RDW 15.8* 15.9* 15.9* 15.5 16.1*  LYMPHSABS -- -- -- -- --  MONOABS -- -- -- -- --  EOSABS -- -- -- -- --  BASOSABS -- -- -- -- --  BANDABS -- -- -- -- --    Chemistries   Lab 07/09/11 0540 07/08/11 2130 07/07/11 0650 07/06/11 0520 07/05/11 0420 07/04/11 0420 07/03/11 0350  NA 140 136 136 139 133* -- --  K 3.6 3.5 3.7 4.3 3.5 -- --  CL 104 101 99 101 99 -- --  CO2 25 24 27 26 24  -- --  GLUCOSE 95 128* 152* 120* 133* -- --  BUN 8 7 5* 6 6 -- --  CREATININE 1.20* 1.06 0.95  0.93 0.94 -- --  CALCIUM 8.7 8.5 9.3 8.9 8.2* -- --  MG 2.1 1.9 -- -- -- 2.0 --  AST -- -- -- -- -- -- 20  ALT -- -- -- -- -- -- 12  ALKPHOS -- -- -- -- -- -- 66  BILITOT -- -- -- -- -- -- 0.2*   ------------------------------------------------------------------------------------------------------------------ estimated creatinine clearance is 72.7 ml/min (by C-G formula based on Cr of 1.2). ------------------------------------------------------------------------------------------------------------------ No results found for this basename: HGBA1C:2 in the last 72 hours ------------------------------------------------------------------------------------------------------------------ No results found for this basename: CHOL:2,HDL:2,LDLCALC:2,TRIG:2,CHOLHDL:2,LDLDIRECT:2 in the last 72 hours ------------------------------------------------------------------------------------------------------------------ No results found for this basename: TSH,T4TOTAL,FREET3,T3FREE,THYROIDAB in the last 72 hours ------------------------------------------------------------------------------------------------------------------ No results found for this basename: VITAMINB12:2,FOLATE:2,FERRITIN:2,TIBC:2,IRON:2,RETICCTPCT:2 in the last 72 hours  Coagulation profile  Lab  07/04/11 0420  INR 1.37  PROTIME --    No results found for this basename: DDIMER:2 in the last 72 hours  Cardiac Enzymes No results found for this basename: CK:3,CKMB:3,TROPONINI:3,MYOGLOBIN:3 in the last 168 hours ------------------------------------------------------------------------------------------------------------------ No components found with this basename: POCBNP:3  Micro Results Recent Results (from the past 240 hour(s))  CULTURE, BLOOD (ROUTINE X 2)     Status: Normal   Collection Time   07/01/11 10:15 PM      Component Value Range Status Comment   Specimen Description BLOOD LEFT ARM   Final    Special Requests BOTTLES DRAWN AEROBIC AND ANAEROBIC 5CC   Final    Culture  Setup Time 07/02/2011 11:15   Final    Culture NO GROWTH 5 DAYS   Final    Report Status 07/08/2011 FINAL   Final   CULTURE, BLOOD (ROUTINE X 2)     Status: Normal   Collection Time   07/01/11 10:30 PM      Component Value Range Status Comment   Specimen Description BLOOD LEFT HAND   Final    Special Requests BOTTLES DRAWN AEROBIC AND ANAEROBIC 5CC   Final    Culture  Setup Time 07/02/2011 11:15   Final    Culture NO GROWTH 5 DAYS   Final    Report Status 07/08/2011 FINAL   Final   MRSA PCR SCREENING     Status: Normal   Collection Time   07/02/11  3:00 AM      Component Value Range Status Comment   MRSA by PCR NEGATIVE  NEGATIVE Final   CULTURE, BLOOD (ROUTINE X 2)     Status: Normal (Preliminary result)   Collection Time   07/07/11  3:45 PM      Component Value Range Status Comment   Specimen Description BLOOD HAND RIGHT   Final    Special Requests BOTTLES DRAWN AEROBIC AND ANAEROBIC 10CC   Final    Culture  Setup Time 07/08/2011 02:10   Final    Culture     Final    Value:        BLOOD CULTURE RECEIVED NO GROWTH TO DATE CULTURE WILL BE HELD FOR 5 DAYS BEFORE ISSUING A FINAL NEGATIVE REPORT   Report Status PENDING   Incomplete   CULTURE, BLOOD (ROUTINE X 2)     Status: Normal (Preliminary  result)   Collection Time   07/07/11  3:55 PM      Component Value Range Status Comment   Specimen Description BLOOD ARM RIGHT   Final    Special Requests BOTTLES DRAWN AEROBIC AND ANAEROBIC 10CC   Final    Culture  Setup Time 07/08/2011 02:10   Final  Culture     Final    Value:        BLOOD CULTURE RECEIVED NO GROWTH TO DATE CULTURE WILL BE HELD FOR 5 DAYS BEFORE ISSUING A FINAL NEGATIVE REPORT   Report Status PENDING   Incomplete     Radiology Reports Dg Chest Port 1 View  07/01/2011  *RADIOLOGY REPORT*  Clinical Data: 46 year old female with fever chest pain increased heart rate.  PORTABLE CHEST - 1 VIEW  Comparison: 10/24/2010.  Findings: Portable semi upright AP view 2327 hours.  Low lung volumes similar the prior study.  Cardiomegaly is difficult to exclude. Other mediastinal contours are within normal limits. Visualized tracheal air column is within normal limits.  No pneumothorax or large pleural effusion.  Pulmonary vascular congestion without overt edema.  Patchy basilar opacity, slightly greater on the left.  IMPRESSION: Low lung volumes with patchy bibasilar opacity which could reflect atelectasis or pneumonia.  PA and lateral radiographs recommended when possible.  Original Report Authenticated By: Harley Hallmark, M.D.   Dg Foot 2 Views Right  07/02/2011  *RADIOLOGY REPORT*  Clinical Data: Right foot wound infection with fever and swelling. History of diabetes.  RIGHT FOOT - 2 VIEW  Comparison: 11/22/2010  Findings: Interval trans tarsal amputation of the right foot. Soft tissue defects at the stump of the amputation.  This could represent irregularities associated with the wound or new ulceration.  There is amorphous subcutaneous gas in the soft tissues along the medial aspect of the midfoot at the level of the navicular.  This is consistent with soft tissue infection with gas forming organism.  Visualized bones appear grossly intact.  There is no obvious bone sclerosis, periosteal  reaction, or bone destruction to suggest osteomyelitis.  If clinically indicated, MRI would be more sensitive for detection of bone changes.  IMPRESSION: Interval trans tarsal amputation of the right foot with the residual or recurrent defect over the wound.  There is soft tissue gas medial to the navicular consistent with infection with gas forming organism.  No obvious changes of osteomyelitis.  Original Report Authenticated By: Marlon Pel, M.D.    Scheduled Meds:    . amLODipine  10 mg Oral Daily  . doxycycline  100 mg Oral Q12H  . enoxaparin  40 mg Subcutaneous Q24H  . fentaNYL  25 mcg Transdermal Q72H  . insulin aspart  0-15 Units Subcutaneous TID WC  . insulin aspart  0-5 Units Subcutaneous QHS  . insulin aspart  4 Units Subcutaneous TID WC  . insulin glargine  5 Units Subcutaneous QHS  . DISCONTD: benazepril  10 mg Oral Daily  . DISCONTD: piperacillin-tazobactam (ZOSYN)  IV  3.375 g Intravenous Q8H  . DISCONTD: vancomycin  1,000 mg Intravenous Q12H   Continuous Infusions:    . sodium chloride 50 mL/hr at 07/09/11 0156  . sodium chloride 100 mL/hr (07/08/11 0933)   PRN Meds:.acetaminophen, hydrALAZINE, HYDROcodone-acetaminophen, HYDROmorphone (DILAUDID) injection, metoCLOPramide (REGLAN) injection, ondansetron (ZOFRAN) IV, ondansetron, oxyCODONE  Assessment & Plan    Patient is a 46 yo diabetic patient with diabetic foot ulcer and R.BKA on 11/24/2010. Presents to [facility] ED with pain, swelling, purulent malodorous discharge from the stump. Hypotensive, tachycardia and febrile upon presentation. Patient was initially on CCM service. Patient was transferred to [facility] on June 30. Patient was transfused 2 units packed red blood cells on July 1 secondary to hemoglobin of 7.3 ; Patient was taken back to the OR on July 2 per orthopedics.  1. Infected Diabetic foot ulcer associated with type 2 diabetes mellitus status post BKA 11-23-09 and then revision of the stump  with I&D on 07-06-11 For now continue on antibiotic. Followed per orthopedics. Wound care per Ortho. Pain meds adjusted 07-07-11.had a temp of 102 on 07-07-11, ? Due to atelectasis, B cultures, UA and CXR repeated, was admitted with sepsis initially and was placed on Vanco and zosyn completing 7 days on 07/09/2011, discussed the case with Dr. [name] orthopedic surgeon who suggested that stump looks clean and that margins were clean post debridement, patient's blood cultures have remained negative, she has no white count, she has nontoxic appearance, at this point IV antibiotics will be stopped we'll place her on doxycycline. Monitor clinically.    2.Diabetes mellitus  Good control. Continue sliding scale insulin + reduced pre-meal and Lantus dose as sugars slightly on the lower side.   CBG (last 3)   Basename 07/09/11 0654 07/08/11 2223 07/08/11 1655  GLUCAP 75 97 91      3. Acute renal failure - prerenal her renal failure has resolved with IVF and PRBC.    4. Hypertension  Patient with history of hypertension. Blood pressure is increasing. Switched her to Norvasc instead of Lotensin as creatinine slightly on the higher side, continue Pain control.    5.Anemia -   AOCD   Patient was transfused 2 units of PRBC - satble H&H.    6. Creatinine top normal - getting gentle IV fluids will continue for a few more hours to complete a total of 24 hours, switch her out of ACE inhibitor for now repeat BMP in the morning.    DVT Prophylaxis  Lovenox     Procedures   1. Irrigation and excisional debridement of right leg wound, 15 cm x 10 cm x 2 cm, including skin, subcutaneous tissue, muscle, and  bone.  2. Revision right below-knee amputation.  3. Application of wound VAC to right leg incision measuring 18 cm in  length.    Consults Ortho Diona Browner K M.D on 07/09/2011 at 10:33 AM  Between 7am to 7pm - Pager - [phone]  After 7pm go to [email or web address] - password  [email or web address]  And look for the night coverage person covering for me after hours  [facility] Office  [phone]

## 2011-07-09 NOTE — Progress Notes (Signed)
Rehab admissions - I have approval for acute inpatient rehab admission.  Bed available and can admit to inpatient rehab today.  Call me for questions.  #[phone]

## 2011-07-10 DIAGNOSIS — IMO0001 Reserved for inherently not codable concepts without codable children: Secondary | ICD-10-CM

## 2011-07-10 DIAGNOSIS — I70269 Atherosclerosis of native arteries of extremities with gangrene, unspecified extremity: Secondary | ICD-10-CM

## 2011-07-10 DIAGNOSIS — Z5189 Encounter for other specified aftercare: Secondary | ICD-10-CM

## 2011-07-10 DIAGNOSIS — S88119A Complete traumatic amputation at level between knee and ankle, unspecified lower leg, initial encounter: Secondary | ICD-10-CM

## 2011-07-10 DIAGNOSIS — I10 Essential (primary) hypertension: Secondary | ICD-10-CM

## 2011-07-10 DIAGNOSIS — E1165 Type 2 diabetes mellitus with hyperglycemia: Secondary | ICD-10-CM

## 2011-07-10 LAB — GLUCOSE, CAPILLARY
Glucose-Capillary: 188 mg/dL — ABNORMAL HIGH (ref 70–99)
Glucose-Capillary: 101 mg/dL — ABNORMAL HIGH (ref 70–99)
Glucose-Capillary: 149 mg/dL — ABNORMAL HIGH (ref 70–99)
Glucose-Capillary: 152 mg/dL — ABNORMAL HIGH (ref 70–99)

## 2011-07-10 NOTE — Plan of Care (Signed)
Problem: RH PAIN MANAGEMENT Goal: RH STG PAIN MANAGED AT OR BELOW PT'S PAIN GOAL < 2 with PRN medications and desensitization techniques  Outcome: Not Progressing Patient lowest pain score today was at a 5 out of 10. Patient reports this is as low as it has got to since the surgery

## 2011-07-10 NOTE — Progress Notes (Signed)
Physical Therapy Session Note  Patient Details  Name: [name] MRN: [id number] Date of Birth: 1965/07/16  Today's Date: 07/10/2011 Time: 1400-1445 Time Calculation (min): 45 min  Skilled Therapeutic Interventions/Progress Updates:  Ambulation/gait training;Balance/vestibular training;DME/adaptive equipment instruction;Functional mobility training;Patient/family education;Stair training;Therapeutic Activities;Therapeutic Exercise;UE/LE Strength taining/ROM   Therapy Documentation Precautions:  Precautions Precautions: Fall Precaution Comments: NWB R LE Required Braces or Orthoses: Knee Immobilizer - Right Restrictions Weight Bearing Restrictions: Yes RLE Weight Bearing: Non weight bearing Other Position/Activity Restrictions: Right LE Pain: Pain Assessment Pain Score:   7 Pain Type: Surgical pain Pain Location: Leg Pain Orientation: Right Pain Descriptors: Aching;Throbbing Pain Onset: On-going Patients Stated Pain Goal: 2 Pain Intervention(s): Medication (See eMAR);Massage Multiple Pain Sites: No  Therapeutic Activity:(15') Transfer Training from w/c to stand into RW and then sit on toilet with min-A Therapeutic Exercise:(15') B LE's in sitting  Gait Training:(15') Using RW 2 x 20' with min-Assist and 1 x 40' with min-A  Patient expressed being anxious about the newness of the amputation and how this will affect her life with her kids.  Patient was being visited by her 2 children (ages 32 and 76) when I arrived to the room but were leaving. Patient was taken to the gym to familiarize her with the surroundings but declined stair training today.  Patient observed how stair training may be possible using a single crutch and handrail.  See FIM for current functional status  Therapy/Group: Individual Therapy  Lyndia Bury J 07/10/2011, 2:10 PM

## 2011-07-10 NOTE — Progress Notes (Signed)
Patient ID: [name], female   DOB: November 03, 1965, 46 y.o.   MRN: [id number]   7/6.  Comfortable, s/p R BKA.  No c/os.  Slight incr Cr to 1.2 Chest- clear; CV- regular; Abd- soft no distention; Extr-  R BKA site bandaged.  CBG (last 3)   Basename 07/10/11 0802 07/09/11 2049 07/09/11 1706  GLUCAP 188* 206* 95   BP Readings from Last 3 Encounters:  07/10/11 153/77  07/09/11 116/52  07/09/11 116/52       Physical Medicine and Rehabilitation Admission H&P  Chief Complaint   Patient presents with   .  Sepsis due to right diabetic gangrenous wound.   :  HPI: SHEYANNE Wolf is a 46 y.o. female with history of DM with right transmet foot amputation for diabetic foot ulcer and poorly healing healing wound and refusing multiple attempts at hospital admission for treatment of infection. Admitted on 06/28 with significant increase in drainage with foul smell due to gangrenous changes, nausea and vomiting and fever. Patient noted to be septic with hypotension and tachycardia. Started on IV antibiotics and underwent R-BKA with placement of VAC on open wound and I & D of L- foot plantar diabetic ulcer on the same day by Dr. [name]. On 07/02, patient taken back to OR for I and D with R-BKA revision and VAC application. Has been transfused with 2 units PRBC for anemia. Renal insufficiency treated with IVF and d/c of ace. Evaluated by therapy team and patient felt to be a good CIR candidate.  Review of Systems  Eyes: Negative for blurred vision and double vision.  Respiratory: Negative for sputum production and shortness of breath.  Cardiovascular: Negative for chest pain and palpitations.  Gastrointestinal: Positive for constipation.  Genitourinary: Negative for urgency and frequency.  Musculoskeletal: Positive for back pain. Negative for myalgias.  Neurological: Negative for headaches.  Psychiatric/Behavioral: The patient is nervous/anxious.  Past Medical History   Diagnosis  Date   .   Diabetes mellitus    .  Hypertension    .  Arthritis    .  Neuropathy    .  Diabetic foot ulcer associated with type 2 diabetes mellitus    .  Intracerebral hemorrhage  As a teenager     States she had burst blood vessel as teenager, now with resultant minor visual field and hearing deficits    Past Surgical History   Procedure  Date   .  Cesarean section    .  Amputation  11/24/2010     Procedure: AMPUTATION FOOT; Surgeon: [name]; Location: [facility] OR; Service: Orthopedics; Laterality: Right; Right FOOT TRANS METATARSAL AMPUTATION   .  I&d extremity  11/24/2010     Procedure: IRRIGATION AND DEBRIDEMENT EXTREMITY; Surgeon: [name]; Location: [facility] OR; Service: Orthopedics; Laterality: Left; Debriedment of left plantar ulcer and trimming of toenails   .  Amputation  07/02/2011     Procedure: AMPUTATION BELOW KNEE; Surgeon: [name]; Location: [facility] OR; Service: Orthopedics; Laterality: Right;   .  Application of wound vac  07/02/2011     Procedure: APPLICATION OF WOUND VAC; Surgeon: [name]; Location: [facility] OR; Service: Orthopedics; Laterality: Right;   .  I&d extremity  07/02/2011     Procedure: IRRIGATION AND DEBRIDEMENT EXTREMITY; Surgeon: [name]; Location: [facility] OR; Service: Orthopedics; Laterality: Left; I & D of Left Foot   .  I&d extremity  07/06/2011     Procedure: IRRIGATION AND DEBRIDEMENT EXTREMITY; Surgeon: [name]; Location: [facility] OR; Service: Orthopedics; Laterality: Right; IRRIGATION/DEBRIDEMENT RIGHT BELOW KNEE AMPUTATION    Family History   Problem  Relation  Age of Onset   .  Diabetes  Mother    .  Diabetes  Father    .  Heart disease  Maternal Grandmother    Social History: Widowed. (Husband passed away last 2022-11-24- 2 children 7 and 9 yr). Homemaker. She reports that she has never smoked. She does not have any smokeless tobacco history on file. She reports that she has a glass of wine once a week. Does not use illicit drugs. Cousin in [place] taking care  of children. (Has returned WC and hospital bed)  Allergies: No Known Allergies  Scheduled Meds:  .  amLODipine  10 mg  Oral  Daily   .  doxycycline  100 mg  Oral  Q12H   .  enoxaparin  40 mg  Subcutaneous  Q24H   .  fentaNYL  25 mcg  Transdermal  Q72H   .  insulin aspart  0-15 Units  Subcutaneous  TID WC   .  insulin aspart  0-5 Units  Subcutaneous  QHS   .  insulin aspart  3 Units  Subcutaneous  TID WC   .  insulin glargine  3 Units  Subcutaneous  QHS   .  DISCONTD: benazepril  10 mg  Oral  Daily   .  DISCONTD: insulin aspart  4 Units  Subcutaneous  TID WC   .  DISCONTD: insulin glargine  5 Units  Subcutaneous  QHS   .  DISCONTD: piperacillin-tazobactam (ZOSYN) IV  3.375 g  Intravenous  Q8H   .  DISCONTD: vancomycin  1,000 mg  Intravenous  Q12H    Medications Prior to Admission   Medication  Sig  Dispense  Refill   .  aspirin EC 81 MG tablet  Take 81 mg by mouth daily.     .  ferrous sulfate 325 (65 FE) MG tablet  Take 325 mg by mouth 2 (two) times daily with a meal.     .  insulin aspart (NOVOLOG) 100 UNIT/ML injection  Inject 0-15 Units into the skin 3 (three) times daily before meals. Inject 0-15 Units into the skin 3 (three) times daily with meals.  CBG 70 - 120: 0 units  CBG 121 - 150: 2 units  CBG 151 - 200: 3 units  CBG 201 - 250: 5 units  CBG 251 - 300: 8 units  CBG 301 - 350: 11 units  CBG 351 - 400: 15 units  If CBG>400 CALL Wolf     .  insulin glargine (LANTUS) 100 UNIT/ML injection  Inject 15 Units into the skin at bedtime.     .  naproxen sodium (ANAPROX) 220 MG tablet  Take 220 mg by mouth 2 (two) times daily with a meal.     Home:  Home Living  Lives With: Son  Available Help at Discharge: Family  Type of Home: House  Home Access: Stairs to enter  Secretary/administrator of Steps: 1  Entrance Stairs-Rails: None  Home Layout: Two level;Bed/bath upstairs  Alternate Level Stairs-Number of Steps: 14  Alternate Level Stairs-Rails: Can reach both;Left  Bathroom  Shower/Tub: Tub/shower unit;Curtain  Bathroom Toilet: Standard  Bathroom Accessibility: No  Home Adaptive Equipment: Walker - rolling  Additional Comments: Loaned uncle BSC, rented hospital bed after last surgery as well as WC  Functional History:  Prior Function  Able to Take Stairs?:  Yes  Driving: Yes  Vocation: On disability  Comments: Following last surgery, pt rented [facility] and hospital bed to live on main level, kept RW and BSC, but loaned BCS out to uncle  Functional Status:  Mobility:  Bed Mobility  Bed Mobility: Supine to Sit;Sitting - Scoot to Edge of Bed  Supine to Sit: 4: Min guard;HOB flat  Sitting - Scoot to [place] of Bed: 5: Supervision  Sit to Supine: 4: Min assist;With rail;HOB flat  Scooting to HOB: 4: Min guard  Transfers  Transfers: Sit to Stand;Stand to Sit  Sit to Stand: 3: Mod assist;With upper extremity assist;From bed  Stand to Sit: 4: Min assist;With upper extremity assist;With armrests;To chair/3-in-1  Stand Pivot Transfers: 3: Mod assist  Ambulation/Gait  Ambulation/Gait Assistance: 3: Mod assist  Ambulation Distance (Feet): 4 Feet  Assistive device: Rolling walker  Ambulation/Gait Assistance Details: Verbal and physical assist to safely use RW. Patient able to use UE's to hop step on LLE.  Gait Pattern: Step-to pattern  Gait velocity: slow gait speed  Stairs: No  Wheelchair Mobility  Wheelchair Mobility: No  ADL:  ADL  Eating/Feeding: Performed;Set up  Where Assessed - Eating/Feeding: Chair  Grooming: Performed;Wash/dry hands;Set up  Where Assessed - Grooming: Supported sitting  Upper Body Bathing: Simulated;Set up  Where Assessed - Upper Body Bathing: Unsupported sitting  Lower Body Bathing: Simulated;Moderate assistance  Where Assessed - Lower Body Bathing: Unsupported sit to stand  Upper Body Dressing: Performed;Minimal assistance (donning gown)  Where Assessed - Upper Body Dressing: Unsupported sitting  Lower Body Dressing: Performed;Moderate  assistance (pt donned left sock and assist with rt. LE)  Where Assessed - Lower Body Dressing: Unsupported sit to stand  Toilet Transfer: Simulated;Moderate assistance  Toilet Transfer Method: Stand pivot  Toilet Transfer Equipment: Other (comment) Nurse, children's)  Tub/Shower Transfer Method: Not assessed  Equipment Used: Rolling walker;Knee Immobilizer  Transfers/Ambulation Related to ADLs: Max verbal cues for hand placement and technique  ADL Comments: Pt. educated on technique of transfer and hand placement to increase pt. safety.  Cognition:  Cognition  Arousal/Alertness: Awake/alert  Orientation Level: Oriented X4  Cognition  Overall Cognitive Status: Appears within functional limits for tasks assessed/performed  Arousal/Alertness: Awake/alert  Orientation Level: Oriented X4 / Intact  Behavior During Session: WFL for tasks performed  Cognition - Other Comments: Pt. with OCD tendencies and very particular about placement of items during session  Blood pressure 147/71, pulse 89, temperature 98.1 F (36.7 C), temperature source Oral, resp. rate 18, height 5\' 7"  (1.702 m), weight 100 kg (220 lb 7.4 oz), last menstrual period 06/17/2011, SpO2 97.00%.  Physical Exam  Nursing note and vitals reviewed.  Constitutional: She is oriented to person, place, and time. She appears well-developed and well-nourished.  HENT:  Head: Normocephalic and atraumatic. Oral mucosa pink and moist. Dentition fair  Eyes: Pupils are equal, round, and reactive to light.  Neck: Normal range of motion.  Cardiovascular: Normal rate and regular rhythm. No murmurs, rubs, or gallops  Pulmonary/Chest: Effort normal and breath sounds normal. No wheezes, rales, or rhonchi  Abdominal: Soft. Bowel sounds positive, non-tender. Musculoskeletal:  R-BKA with dry dressing. Patient extremely anxious regarding her wound--did not want dressing changed again. Left plantar surface with callus with soft central area --no drainage or  tenderness.  Neurological: She is alert and oriented to person, place, and time. Strength grossly 5/5 in the upper ext. RLE 3/5. LLE 4 to 4+/5. Sensory exam decreased in stocking glove distribution in intact limbs.  Skin:  Skin is warm and dry.  Psychiatric: Her speech is normal. Judgment and thought content normal. Her mood appears anxious. Cognition and memory are normal.  CBC Status: Abnormal    Collection Time    07/08/11 6:08 AM   Component  Value  Range  Comment    WBC  8.0  4.0 - 10.5 K/uL  WHITE COUNT CONFIRMED ON SMEAR    RBC  3.20 (*)  3.87 - 5.11 MIL/uL     Hemoglobin  8.9 (*)  12.0 - 15.0 g/dL  REPEATED TO VERIFY    HCT  27.4 (*)  36.0 - 46.0 %     MCV  85.6  78.0 - 100.0 fL     MCH  27.8  26.0 - 34.0 pg     MCHC  32.5  30.0 - 36.0 g/dL     RDW  47.8 (*)  29.5 - 15.5 %     Platelets  347  150 - 400 K/uL  PLATELET COUNT CONFIRMED BY SMEAR   BASIC METABOLIC PANEL Status: Abnormal    Collection Time    07/08/11 6:08 AM   Component  Value  Range  Comment    Sodium  136  135 - 145 mEq/L     Potassium  3.5  3.5 - 5.1 mEq/L     Chloride  101  96 - 112 mEq/L     CO2  24  19 - 32 mEq/L     Glucose, Bld  128 (*)  70 - 99 mg/dL     BUN  7  6 - 23 mg/dL     Creatinine, Ser  6.21  0.50 - 1.10 mg/dL     Calcium  8.5  8.4 - 10.5 mg/dL     GFR calc non Af Amer  63 (*)  >90 mL/min     GFR calc Af Amer  73 (*)  >90 mL/min    MAGNESIUM Status: Normal    Collection Time    07/08/11 6:08 AM   Component  Value  Range  Comment    Magnesium  1.9  1.5 - 2.5 mg/dL    GLUCOSE, CAPILLARY Status: Abnormal    Collection Time    07/08/11 6:18 AM   Component  Value  Range  Comment    Glucose-Capillary  137 (*)  70 - 99 mg/dL     Comment 1  Notify RN      Comment 2  Documented in Chart     GLUCOSE, CAPILLARY Status: Abnormal    Collection Time    07/08/11 10:56 AM   Component  Value  Range  Comment    Glucose-Capillary  102 (*)  70 - 99 mg/dL    GLUCOSE, CAPILLARY Status: Normal    Collection Time      07/08/11 4:55 PM   Component  Value  Range  Comment    Glucose-Capillary  91  70 - 99 mg/dL    GLUCOSE, CAPILLARY Status: Normal    Collection Time    07/08/11 10:23 PM   Component  Value  Range  Comment    Glucose-Capillary  97  70 - 99 mg/dL    CBC Status: Abnormal    Collection Time    07/09/11 5:40 AM   Component  Value  Range  Comment    WBC  6.8  4.0 - 10.5 K/uL     RBC  3.41 (*)  3.87 - 5.11 MIL/uL     Hemoglobin  9.4 (*)  12.0 - 15.0 g/dL     HCT  16.1 (*)  09.6 - 46.0 %     MCV  85.6  78.0 - 100.0 fL     MCH  27.6  26.0 - 34.0 pg     MCHC  32.2  30.0 - 36.0 g/dL     RDW  04.5 (*)  40.9 - 15.5 %     Platelets  367  150 - 400 K/uL    MAGNESIUM Status: Normal    Collection Time    07/09/11 5:40 AM   Component  Value  Range  Comment    Magnesium  2.1  1.5 - 2.5 mg/dL    BASIC METABOLIC PANEL Status: Abnormal    Collection Time    07/09/11 5:40 AM   Component  Value  Range  Comment    Sodium  140  135 - 145 mEq/L     Potassium  3.6  3.5 - 5.1 mEq/L     Chloride  104  96 - 112 mEq/L     CO2  25  19 - 32 mEq/L     Glucose, Bld  95  70 - 99 mg/dL     BUN  8  6 - 23 mg/dL     Creatinine, Ser  8.11 (*)  0.50 - 1.10 mg/dL     Calcium  8.7  8.4 - 10.5 mg/dL     GFR calc non Af Amer  54 (*)  >90 mL/min     GFR calc Af Amer  63 (*)  >90 mL/min    GLUCOSE, CAPILLARY Status: Normal    Collection Time    07/09/11 6:54 AM   Component  Value  Range  Comment    Glucose-Capillary  75  70 - 99 mg/dL    VANCOMYCIN, TROUGH Status: Normal    Collection Time    07/09/11 10:00 AM   Component  Value  Range  Comment    Vancomycin Tr  20.0  10.0 - 20.0 ug/mL    Dg Chest Port 1 View  07/07/2011 *RADIOLOGY REPORT* Clinical Data: Fever. PORTABLE CHEST - 1 VIEW Comparison: Chest x-ray 07/01/2011. Findings: Lung volumes are low. Bibasilar opacities are favored to represent areas of subsegmental atelectasis, however, there is a more focal medial left lower lobe opacity that could represent sequela of  aspiration or airspace consolidation from infection. No pleural effusions. Pulmonary venous congestion without frank pulmonary edema. Mild cardiomegaly is unchanged. Mediastinal contours are unremarkable. IMPRESSION: 1. Low lung volumes with probable bibasilar subsegmental atelectasis. 2. Mild cardiomegaly. Original Report Authenticated By: Florencia Reasons, M.D.  Post Admission Physician Evaluation:  1. Functional deficits secondary to right BKA 2. Patient is admitted to receive collaborative, interdisciplinary care between the physiatrist, rehab nursing staff, and therapy team. 3. Patient's level of medical complexity and substantial therapy needs in context of that medical necessity cannot be provided at a lesser intensity of care such as a SNF. 4. Patient has experienced substantial functional loss from his/her baseline which was documented above under the "Functional History" and "Functional Status" headings. Judging by the patient's diagnosis, physical exam, and functional history, the patient has potential for functional progress which will result in measurable gains while on inpatient rehab. These gains will be of substantial and practical use upon discharge in facilitating mobility and self-care at the household level. 5. Physiatrist will provide 24 hour management of medical needs as well as oversight of the therapy plan/treatment and provide guidance as appropriate  regarding the interaction of the two. 6. 24 hour rehab nursing will assist with bladder management, bowel management, safety, skin/wound care, disease management, medication administration, pain management and patient education and help integrate therapy concepts, techniques,education, etc. 7. PT will assess and treat for: Lower extremity strength, range of motion, functional mobility, pre-prosthetic education, safety and pain management. Goals are: Modified independent. 8. OT will assess and treat for: Upper extremity strength,  ADLs, functional mobility, safety. Goals are: Modified independent. 9. SLP will assess and treat for: not app 10. Case Management and Social Worker will assess and treat for psychological issues and discharge planning. 11. Team conference will be held weekly to assess progress toward goals and to determine barriers to discharge. 12. Patient will receive at least 3 hours of therapy per day at least 5 days per week. 13. ELOS: One week Prognosis: excellent Medical Problem List and Plan:  1. DVT Prophylaxis/Anticoagulation: Pharmaceutical: Lovenox  2. Pain Management: prn oxycodone effective.  3. Mood: Seems a little withdrawn and anxious. Educated regarding need for complete healing and that prosthesis likely couple of months down the road. Safety reiterated.  4. Neuropsych: This patient is capable of making decisions on his/her own behalf.  5. Left foot ulcer: healed. Pressure relief measures to prevent reopening/breakdown. The patient has custom insoles and shoes through [employer]. She should wear these with mobility attempts.  6. DM type 2: monitor with AC/HS cbg checks. Continue lantus  7. Fevers: resolving. IV antibiotics changed to po doxycycline today. Continue to monitor temp curve. Recheck CBC 07/08. Monitor wounds for drainage.  8. HTN: ACE discontinued due to renal insufficiency. Continue norvasc. Continue to monitor with bid checks.  9. Anemia: Continue iron supplement.  10. Constipation: Will add miralax as reports no BM since Monday? None recorded  Ivory Broad, Wolf  07/09/11

## 2011-07-10 NOTE — Progress Notes (Signed)
Pt reported she was hot and felt like she was running a fever, asked [place] to check her temp. Temp was 97.5. Reported she felt pain in her stump and wanted RN to remove dressing and look at it. Upon removing knee immobilizer, noted that a small amount of blood had soaked through kerlex and ace wrap. Noted approximate 1" long area mid incision that was actively bleeding. Placed several 4x4 gauze over area and wrapped stump with coban. Will check incision in a few hours to assess bleeding. Pt given 15mg  Oxy IR for pain at this time. Continue to monitor. Mick Sell, RN

## 2011-07-10 NOTE — Evaluation (Signed)
Occupational Therapy Assessment and Plan  Patient Details  Name: [name] MRN: [id number] Date of Birth: 1965-01-25  OT Diagnosis: muscle weakness (generalized) Rehab Potential:  good   ELOS:   7-10 days Today's Date: 07/10/2011 Time:  0900-1030  ( )  1st session Today's Date: 07/10/2011 Time: 4098-1191  2nd session Time Calculation (min): 15 min  Problem List:  Patient Active Problem List  Diagnosis  . Diabetic foot ulcer associated with type 2 diabetes mellitus  . Hypertension  . Arthritis  . Neuropathy  . Intracerebral hemorrhage  . Diabetes mellitus  . Anemia  . Gas gangrene  . Sepsis  . Acute renal failure  . Unilateral complete BKA    Past Medical History:  Past Medical History  Diagnosis Date  . Diabetes mellitus   . Hypertension   . Arthritis   . Neuropathy   . Diabetic foot ulcer associated with type 2 diabetes mellitus   . Intracerebral hemorrhage As a teenager     States she had burst blood vessel as teenager, now with resultant minor visual field and hearing deficits   Past Surgical History:  Past Surgical History  Procedure Date  . Cesarean section   . Amputation 11/24/2010    Procedure: AMPUTATION FOOT;  Surgeon: [name], MD;  Location: [facility] OR;  Service: Orthopedics;  Laterality: Right;  Right  FOOT TRANS METATARSAL AMPUTATION  . I&d extremity 11/24/2010    Procedure: IRRIGATION AND DEBRIDEMENT EXTREMITY;  Surgeon: [name], MD;  Location: [facility] OR;  Service: Orthopedics;  Laterality: Left;  Debriedment of left plantar ulcer and trimming of toenails  . Amputation 07/02/2011    Procedure: AMPUTATION BELOW KNEE;  Surgeon: [name], MD;  Location: [facility] OR;  Service: Orthopedics;  Laterality: Right;  . Application of wound vac 07/02/2011    Procedure: APPLICATION OF WOUND VAC;  Surgeon: [name], MD;  Location: [facility] OR;  Service: Orthopedics;  Laterality: Right;  . I&d extremity 07/02/2011    Procedure: IRRIGATION AND DEBRIDEMENT EXTREMITY;   Surgeon: [name], MD;  Location: [facility] OR;  Service: Orthopedics;  Laterality: Left;  I & D of Left Foot  . I&d extremity 07/06/2011    Procedure: IRRIGATION AND DEBRIDEMENT EXTREMITY;  Surgeon: [name], MD;  Location: [facility] OR;  Service: Orthopedics;  Laterality: Right;  IRRIGATION/DEBRIDEMENT RIGHT BELOW KNEE AMPUTATION    Assessment & Plan Clinical Impression: Patient is a 46 y.o. year old female with recent admission to the hospital on 6/28 with significant increase in drainage in right foot.  Had history of DM with right transmet foot amputation for diabetic foot ulcer and poorly healing wound and refusing multiple attempts at hospital for treatment Patient upon admission noted to be septic with hypotension and tachycardia.  On 7/22 pt taken back to OR for I and D with Right BKA revision and VAC application.   Patient transferred to CIR on 07/09/2011 .    Patient currently requires mod with basic self-care skills secondary to muscle weakness.  Prior to hospitalization, patient could complete IADL, and BADL with no assist.  Patient will benefit from skilled intervention to increase independence with basic self-care skills and increase level of independence with iADL prior to discharge home independently.  Anticipate patient will require minimal physical assistance and follow up home health.     OT Evaluation Precautions/Restrictions    General:  2nd session;  Discussed goals for patient, treatment, plan of care, and ELOS.  Pt in agreement except is leary of being able  to do the stairs so she can use the 2nd floor tub/shower.       Pain   Home Living/Prior Functioning Home Living Lives With: Son;Daughter (9yo son, 7yo dtr) Available Help at Discharge: Other (Comment) (No help at home upon discharge) Type of Home: House Home Access: Stairs to enter [employer] of Steps: 1 Entrance Stairs-Rails: None Home Layout: Two level;Bed/bath upstairs Alternate Level Stairs-Number of  Steps: 14 Alternate Level Stairs-Rails: Left (B handrails half way up and then on Left for the remainder) Bathroom Shower/Tub: Tub/shower unit;Curtain Bathroom Toilet: Standard Bathroom Accessibility: No Home Adaptive Equipment: Walker - rolling Additional Comments:  (has given the hospital bed and 3n1 back) IADL History Homemaking Responsibilities: Yes Meal Prep Responsibility: Primary Laundry Responsibility: Primary Cleaning Responsibility: Primary Bill Paying/Finance Responsibility: Primary Shopping Responsibility: Primary Child Care Responsibility: Primary Current License: Yes (was driving P[employer]) Mode of Transportation: Car Occupation: Works at home Leisure and Hobbies:  (flowers, gardens) Prior Function Level of Independence: Independent with transfers;Independent with basic ADLs;Independent with homemaking with ambulation Able to Take Stairs?: Yes Driving: Yes Vocation: Unemployed Comments: husband died in November 10, 2012and then had first surgery ~ November 2012.   ADL   Vision/Perception : PMH of visual field cut but no deficits noted on eval    CognitionL  Wfl    Sensation:  wfl     Motor wfl   Mobility :  Mod assist    Trunk/Postural Assessment     Balance:  Mod assist with BADL in standing at sink for 1 minute.     Extremity/Trunk Assessment:  wfl      See FIM for current functional status Refer to Care Plan for Long Term Goals  Recommendations for other services: None  Discharge Criteria: Patient will be discharged from OT if patient refuses treatment 3 consecutive times without medical reason, if treatment goals not met, if there is a change in medical status, if patient makes no progress towards goals or if patient is discharged from hospital.  The above assessment, treatment plan, treatment alternatives and goals were discussed and mutually agreed upon: by patient  Humberto Seals 07/10/2011, 5:25 PM

## 2011-07-10 NOTE — Evaluation (Signed)
Physical Therapy Assessment and Plan  Patient Details  Name: [name] MRN: [id number] Date of Birth: [birth year]  PT Diagnosis: Difficulty walking, Muscle weakness and Pain in R BKA Rehab Potential: Good ELOS: 7-10 days   Today's Date: 07/10/2011 Time: 1100-1200 Time Calculation (min): 60 min  Problem List:  Patient Active Problem List  Diagnosis  . Diabetic foot ulcer associated with type 2 diabetes mellitus  . Hypertension  . Arthritis  . Neuropathy  . Intracerebral hemorrhage  . Diabetes mellitus  . Anemia  . Gas gangrene  . Sepsis  . Acute renal failure  . Unilateral complete BKA    Past Medical History:  Past Medical History  Diagnosis Date  . Diabetes mellitus   . Hypertension   . Arthritis   . Neuropathy   . Diabetic foot ulcer associated with type 2 diabetes mellitus   . Intracerebral hemorrhage As a teenager     States she had burst blood vessel as teenager, now with resultant minor visual field and hearing deficits   Past Surgical History:  Past Surgical History  Procedure Date  . Cesarean section   . Amputation 11/24/2010    Procedure: AMPUTATION FOOT;  Surgeon: [name], MD;  Location: [facility] OR;  Service: Orthopedics;  Laterality: Right;  Right  FOOT TRANS METATARSAL AMPUTATION  . I&d extremity 11/24/2010    Procedure: IRRIGATION AND DEBRIDEMENT EXTREMITY;  Surgeon: [name], MD;  Location: [facility] OR;  Service: Orthopedics;  Laterality: Left;  Debriedment of left plantar ulcer and trimming of toenails  . Amputation 07/02/2011    Procedure: AMPUTATION BELOW KNEE;  Surgeon: [name], MD;  Location: [facility] OR;  Service: Orthopedics;  Laterality: Right;  . Application of wound vac 07/02/2011    Procedure: APPLICATION OF WOUND VAC;  Surgeon: [name], MD;  Location: [facility] OR;  Service: Orthopedics;  Laterality: Right;  . I&d extremity 07/02/2011    Procedure: IRRIGATION AND DEBRIDEMENT EXTREMITY;  Surgeon: [name], MD;  Location: [facility] OR;  Service:  Orthopedics;  Laterality: Left;  I & D of Left Foot  . I&d extremity 07/06/2011    Procedure: IRRIGATION AND DEBRIDEMENT EXTREMITY;  Surgeon: [name], MD;  Location: [facility] OR;  Service: Orthopedics;  Laterality: Right;  IRRIGATION/DEBRIDEMENT RIGHT BELOW KNEE AMPUTATION    Assessment & Plan Clinical Impression:  [name] is a 46 y.o. female with history of DM with right transmet foot amputation for diabetic foot ulcer and poorly healing healing wound and refusing multiple attempts at hospital admission for treatment of infection. Admitted on 06/28 with significant increase in drainage with foul smell due to gangrenous changes, nausea and vomiting and fever. Patient noted to be septic with hypotension and tachycardia. Started on IV antibiotics and underwent R-BKA with placement of VAC on open wound and I & D of L- foot plantar diabetic ulcer on the same day by Dr. [name]. On 07/02, patient taken back to OR for I and D with R-BKA revision and VAC application. Has been transfused with 2 units PRBC for anemia. Renal insufficiency treated with IVF and d/c of ace.  Patient transferred to CIR on 07/09/2011 .   Patient currently requires mod with mobility secondary to muscle weakness and balance.  Prior to hospitalization, patient was Independent with mobility and lived with Son;Daughter (9yo son, 7yo dtr) in a House home.  Home access is 1Stairs to enter.  Patient will benefit from skilled PT intervention to maximize safe functional mobility, minimize fall risk and decrease caregiver  burden for planned discharge Home alone with 7 and 9yo children.  Anticipate patient will benefit from follow up HH at discharge.  PT - End of Session Activity Tolerance: Endurance does not limit participation in activity Endurance Deficit: No PT Assessment Rehab Potential: Good Barriers to Discharge: Inaccessible home environment;Decreased caregiver support Barriers to Discharge Comments: Lives alone with 7 and 9yo  children. Bedroom on second floor.  PT Plan PT Frequency: 1-2 X/day, 60-90 minutes Estimated Length of Stay: 7-10 days PT Treatment/Interventions: Ambulation/gait training;Balance/vestibular training;DME/adaptive equipment instruction;Functional mobility training;Patient/family education;Stair training;Therapeutic Activities;Therapeutic Exercise;UE/LE Strength taining/ROM PT Recommendation Follow Up Recommendations: Home health PT  PT Evaluation Precautions/Restrictions Precautions Precautions: Fall Precaution Comments: NWB R LE Required Braces or Orthoses: Knee Immobilizer - Right Restrictions Weight Bearing Restrictions: Yes RLE Weight Bearing: Non weight bearing Other Position/Activity Restrictions: Right LE General @[id number] ([phone]::[id number]) Pain Pain Assessment Pain Assessment: 0-10 Pain Score:   5 Pain Type: Surgical pain Pain Location: Leg Pain Orientation: Right Pain Descriptors: Aching;Throbbing Pain Onset: On-going Patients Stated Pain Goal: 2 Pain Intervention(s): Medication (See eMAR);Massage Multiple Pain Sites: No Home Living/Prior Functioning Home Living Lives With: Son;Daughter (9yo son, 7yo dtr) Available Help at Discharge: Other (Comment) (No help at home upon discharge) Type of Home: House Home Access: Stairs to enter [employer] of Steps: 1 Entrance Stairs-Rails: None Home Layout: Two level;Bed/bath upstairs Alternate Level Stairs-Number of Steps: 14 Alternate Level Stairs-Rails: Left (B handrails half way up and then on Left for the remainder) Bathroom Shower/Tub: Tub/shower unit;Curtain Bathroom Toilet: Standard Bathroom Accessibility: No Home Adaptive Equipment: Walker - rolling Additional Comments:  (has given the hospital bed and 3n1 back) Prior Function Level of Independence: Independent with transfers;Independent with basic ADLs;Independent with homemaking with ambulation Able to Take Stairs?: Yes Driving: Yes Vocation:  Unemployed Comments: husband died in Nov 12, 2012and then had first surgery ~ November 2012.   Vision/Perception  Vision - History Baseline Vision: Wears glasses for distance only Vision - Assessment Eye Alignment: Within Functional Limits Vision Assessment: Vision not tested Perception Perception: Within Functional Limits  Cognition Overall Cognitive Status: Appears within functional limits for tasks assessed Arousal/Alertness: Awake/alert Orientation Level: Oriented X4 Sensation Sensation Light Touch: Appears Intact (Left toes have diminished sensation) Coordination Gross Motor Movements are Fluid and Coordinated: Yes Motor  Motor Motor: Within Functional Limits  Mobility Bed Mobility Bed Mobility: Rolling Right;Rolling Left;Supine to Sit;Sitting - Scoot to [place] of Bed;Sit to Sidelying Left;Scooting to [facility] Rolling Right: 6: Modified independent (Device/Increase time);With rail Rolling Left: 6: Modified independent (Device/Increase time);With rail Supine to Sit: 4: Min guard;HOB flat Sitting - Scoot to [place] of Bed: 5: Supervision Sitting - Scoot to [place] of Bed Details: Verbal cues for precautions/safety Sit to Supine: 5: Supervision Sit to Supine - Details: Verbal cues for technique Sit to Sidelying Left: 5: Supervision Sit to Sidelying Left Details: Verbal cues for technique Scooting to HOB: 5: Supervision Scooting to [facility] Details: Verbal cues for technique Transfers Sit to Stand: 3: Mod assist Sit to Stand Details: Manual facilitation for weight shifting Stand to Sit: 4: Min assist Stand to Sit Details (indicate cue type and reason): Manual facilitation for placement Stand Pivot Transfers: 3: Mod assist Stand Pivot Transfer Details: Manual facilitation for placement Locomotion  Ambulation Ambulation: Yes Ambulation/Gait Assistance: 3: Mod assist Ambulation Distance (Feet): 10 Feet Assistive device: Rolling walker Ambulation/Gait Assistance Details: Verbal cues for  sequencing;Verbal cues for technique;Verbal cues for precautions/safety;Verbal cues for gait pattern Ambulation/Gait Assistance Details: hop step L LE Gait Gait:  Yes Gait Pattern: Step-to pattern Gait velocity: hop step on L LE and slow gait speed Stairs / Additional Locomotion Stairs: No Wheelchair Mobility Wheelchair Mobility: No  Trunk/Postural Assessment  Postural Control Postural Control: Within Functional Limits  Balance Balance Balance Assessed: Yes Static Sitting Balance Static Sitting - Balance Support: Feet supported;Left upper extremity supported (R BKA) Static Sitting - Level of Assistance: 5: Stand by assistance Dynamic Sitting Balance Dynamic Sitting - Balance Support: Feet supported (R BKA) Dynamic Sitting - Level of Assistance: 4: Min assist Static Standing Balance Static Standing - Balance Support: Bilateral upper extremity supported (R BKA) Static Standing - Level of Assistance: 4: Min assist Extremity Assessment  RLE Assessment RLE Assessment: Exceptions to WFL (R BKA, R Hip WFL and MMT 4/5, Knee ROM 0-70 wrapped) LLE Assessment LLE Assessment: Within Functional Limits  See FIM for current functional status Refer to Care Plan for Long Term Goals  Recommendations for other services: None  Discharge Criteria: Patient will be discharged from PT if patient refuses treatment 3 consecutive times without medical reason, if treatment goals not met, if there is a change in medical status, if patient makes no progress towards goals or if patient is discharged from hospital.  The above assessment, treatment plan, treatment alternatives and goals were discussed and mutually agreed upon: by patient  Rex Kras 07/10/2011, 12:28 PM

## 2011-07-10 NOTE — Plan of Care (Addendum)
Overall Plan of Care [facility]) Patient Details Name: [name] MRN: [id number] DOB: 1965/04/12  Diagnosis:  Right BKA  Primary Diagnosis:    Unilateral complete BKA Co-morbidities: DM, HTN  Functional Problem List  Patient demonstrates impairments in the following areas: Balance, Bowel, Endurance, Medication Management, Pain, Perception, Safety and Skin Integrity  Basic ADL's: grooming, bathing, dressing, toileting and transfers to toilet and tub/shower and homemaking duties Advanced ADL's: simple meal preparation, laundry and light housekeeping  Transfers:  bed mobility, bed to chair, toilet, tub/shower and car Locomotion:  ambulation and stairs  Additional Impairments:  None  Anticipated Outcomes Item Anticipated Outcome  Eating/Swallowing    Basic self-care  Mod independent  Tolieting  Mod independent  Bowel/Bladder  Cont of bowel/bladder independent  Transfers  Mod-Independent  Locomotion  Mod-Independent x 50' in home environment and outdoors.  Communication    Cognition    Pain  <2 with PRN meds, desensitization techniques  Safety/Judgment    Other     Therapy Plan: PT Frequency: 1-2 X/day, 60-90 minutes OT Frequency: 1-2 X/day, 60-90 minutes     Team Interventions: Item RN PT OT SLP SW TR Other  Self Care/Advanced ADL Retraining   x      Neuromuscular Re-Education         Therapeutic Activities  x x   x   UE/LE Strength Training/ROM  x x      UE/LE Coordination Activities  x x      Visual/Perceptual Remediation/Compensation         DME/Adaptive Equipment Instruction  x x      Therapeutic Exercise  x x      Balance/Vestibular Training  x x      Patient/Family Education x x x   x   Cognitive Remediation/Compensation         Functional Mobility Training  x x      Ambulation/Gait Training  x       Museum/gallery curator  x       Wheelchair Propulsion/Positioning   x      Functional Tourist information centre manager Reintegration      x     Dysphagia/Asp[employer] Film/video editor         Bladder Management         Bowel Management x        Disease Management/Prevention         Pain Management x        Medication Management x        Skin Care/Wound Management x        Splinting/Orthotics         Discharge Planning x  x   x   Psychosocial Support x  x   x                      Team Discharge Planning: Destination:  Home Projected Follow-up:  PT, OT and Home Health Projected Equipment Needs:  Elevated Engineer, civil (consulting), Tub Bench and Wheelchair Patient/family involved in discharge planning:  Yes  MD ELOS: 7-10 days Medical Rehab Prognosis:  Excellent Assessment: Pt has been admitted for CIR therapies. The team will focus on self-care, fxnl mobility, wheelchair use, pain mgt, wound care, safety, adaptive equipment ed, pre-pros ed. Goals overall are Modified independent.

## 2011-07-11 LAB — GLUCOSE, CAPILLARY
Glucose-Capillary: 119 mg/dL — ABNORMAL HIGH (ref 70–99)
Glucose-Capillary: 136 mg/dL — ABNORMAL HIGH (ref 70–99)
Glucose-Capillary: 133 mg/dL — ABNORMAL HIGH (ref 70–99)
Glucose-Capillary: 137 mg/dL — ABNORMAL HIGH (ref 70–99)
Glucose-Capillary: 144 mg/dL — ABNORMAL HIGH (ref 70–99)
Glucose-Capillary: 135 mg/dL — ABNORMAL HIGH (ref 70–99)

## 2011-07-11 MED ORDER — SENNA 8.6 MG PO TABS
1.0000 | ORAL_TABLET | Freq: Two times a day (BID) | ORAL | Status: DC
  Administered 2011-07-11: 8.6 mg via ORAL
  Filled 2011-07-11 (×11): qty 1

## 2011-07-11 MED ORDER — SORBITOL 70 % SOLN
30.0000 mL | Freq: Every day | Status: DC | PRN
  Administered 2011-07-11: 30 mL via ORAL
  Filled 2011-07-11: qty 30

## 2011-07-11 NOTE — Plan of Care (Signed)
Problem: RH BOWEL ELIMINATION Goal: RH STG MANAGE BOWEL WITH ASSISTANCE STG Manage Bowel with mod independence  Outcome: Not Progressing Patient given sorbitol this afternoon. Patient refusing suppository.

## 2011-07-11 NOTE — Progress Notes (Signed)
Patient alert and oriented x 3. Patient Rt BKA dressing changed. Scant serosanguinous drainage from middle of incision. Ace, Kerlix and and gauze applied. Sutures intact. Patient last bowel movement documented on 6/27. Dr Amador Cunas notified. New orders received. Patient placed on scheduled laxative. Patient refused suppository. Patient took sorbitol 1546 with no results at this time. Patient pain managed with 15mg  oxy IR at 0908. No other complaints noted. Continue with plan of care.

## 2011-07-11 NOTE — Progress Notes (Signed)
Patient ID: [name], female   DOB: Dec 02, 1965, 46 y.o.   MRN: [id number] Patient ID: [name], female   DOB: Oct 31, 1965, 46 y.o.   MRN: [id number]   7/7.  Comfortable, s/p R BKA.  No c/os.  Slight incr Cr to 1.2; CBG remain well controlled  Chest- clear; CV- regular; Abd- soft no distention; Extr-  R BKA site bandaged.  CBG (last 3)   Basename 07/11/11 0731 07/10/11 2047 07/10/11 1702  GLUCAP 119* 152* 149*   BP Readings from Last 3 Encounters:  07/11/11 136/75  07/09/11 116/52  07/09/11 116/52       Physical Medicine and Rehabilitation Admission H&P  Chief Complaint   Patient presents with   .  Sepsis due to right diabetic gangrenous wound.   :  HPI: [name] is a 46 y.o. female with history of DM with right transmet foot amputation for diabetic foot ulcer and poorly healing healing wound and refusing multiple attempts at hospital admission for treatment of infection. Admitted on 06/28 with significant increase in drainage with foul smell due to gangrenous changes, nausea and vomiting and fever. Patient noted to be septic with hypotension and tachycardia. Started on IV antibiotics and underwent R-BKA with placement of VAC on open wound and I & D of L- foot plantar diabetic ulcer on the same day by Dr. [name]. On 07/02, patient taken back to OR for I and D with R-BKA revision and VAC application. Has been transfused with 2 units PRBC for anemia. Renal insufficiency treated with IVF and d/c of ace. Evaluated by therapy team and patient felt to be a good CIR candidate.  Review of Systems  Eyes: Negative for blurred vision and double vision.  Respiratory: Negative for sputum production and shortness of breath.  Cardiovascular: Negative for chest pain and palpitations.  Gastrointestinal: Positive for constipation.  Genitourinary: Negative for urgency and frequency.  Musculoskeletal: Positive for back pain. Negative for myalgias.  Neurological: Negative for  headaches.  Psychiatric/Behavioral: The patient is nervous/anxious.  Past Medical History   Diagnosis  Date   .  Diabetes mellitus    .  Hypertension    .  Arthritis    .  Neuropathy    .  Diabetic foot ulcer associated with type 2 diabetes mellitus    .  Intracerebral hemorrhage  As a teenager     States she had burst blood vessel as teenager, now with resultant minor visual field and hearing deficits    Past Surgical History   Procedure  Date   .  Cesarean section    .  Amputation  11/24/2010     Procedure: AMPUTATION FOOT; Surgeon: [name], MD; Location: [facility] OR; Service: Orthopedics; Laterality: Right; Right FOOT TRANS METATARSAL AMPUTATION   .  I&d extremity  11/24/2010     Procedure: IRRIGATION AND DEBRIDEMENT EXTREMITY; Surgeon: [name], MD; Location: [facility] OR; Service: Orthopedics; Laterality: Left; Debriedment of left plantar ulcer and trimming of toenails   .  Amputation  07/02/2011     Procedure: AMPUTATION BELOW KNEE; Surgeon: [name], MD; Location: [facility] OR; Service: Orthopedics; Laterality: Right;   .  Application of wound vac  07/02/2011     Procedure: APPLICATION OF WOUND VAC; Surgeon: [name], MD; Location: [facility] OR; Service: Orthopedics; Laterality: Right;   .  I&d extremity  07/02/2011     Procedure: IRRIGATION AND DEBRIDEMENT EXTREMITY; Surgeon: [name], MD; Location: [facility] OR; Service: Orthopedics; Laterality: Left; I & D of  Left Foot   .  I&d extremity  07/06/2011     Procedure: IRRIGATION AND DEBRIDEMENT EXTREMITY; Surgeon: [name], MD; Location: [facility] OR; Service: Orthopedics; Laterality: Right; IRRIGATION/DEBRIDEMENT RIGHT BELOW KNEE AMPUTATION    Family History   Problem  Relation  Age of Onset   .  Diabetes  Mother    .  Diabetes  Father    .  Heart disease  Maternal Grandmother    Social History: Widowed. (Husband passed away last 2022/11/07- 2 children 7 and 9 yr). Homemaker. She reports that she has never smoked. She does not have any smokeless tobacco  history on file. She reports that she has a glass of wine once a week. Does not use illicit drugs. Cousin in [place] taking care of children. (Has returned WC and hospital bed)  Allergies: No Known Allergies  Scheduled Meds:  .  amLODipine  10 mg  Oral  Daily   .  doxycycline  100 mg  Oral  Q12H   .  enoxaparin  40 mg  Subcutaneous  Q24H   .  fentaNYL  25 mcg  Transdermal  Q72H   .  insulin aspart  0-15 Units  Subcutaneous  TID WC   .  insulin aspart  0-5 Units  Subcutaneous  QHS   .  insulin aspart  3 Units  Subcutaneous  TID WC   .  insulin glargine  3 Units  Subcutaneous  QHS   .  DISCONTD: benazepril  10 mg  Oral  Daily   .  DISCONTD: insulin aspart  4 Units  Subcutaneous  TID WC   .  DISCONTD: insulin glargine  5 Units  Subcutaneous  QHS   .  DISCONTD: piperacillin-tazobactam (ZOSYN) IV  3.375 g  Intravenous  Q8H   .  DISCONTD: vancomycin  1,000 mg  Intravenous  Q12H    Medications Prior to Admission   Medication  Sig  Dispense  Refill   .  aspirin EC 81 MG tablet  Take 81 mg by mouth daily.     .  ferrous sulfate 325 (65 FE) MG tablet  Take 325 mg by mouth 2 (two) times daily with a meal.     .  insulin aspart (NOVOLOG) 100 UNIT/ML injection  Inject 0-15 Units into the skin 3 (three) times daily before meals. Inject 0-15 Units into the skin 3 (three) times daily with meals.  CBG 70 - 120: 0 units  CBG 121 - 150: 2 units  CBG 151 - 200: 3 units  CBG 201 - 250: 5 units  CBG 251 - 300: 8 units  CBG 301 - 350: 11 units  CBG 351 - 400: 15 units  If CBG>400 CALL MD     .  insulin glargine (LANTUS) 100 UNIT/ML injection  Inject 15 Units into the skin at bedtime.     .  naproxen sodium (ANAPROX) 220 MG tablet  Take 220 mg by mouth 2 (two) times daily with a meal.     Home:  Home Living  Lives With: Son  Available Help at Discharge: Family  Type of Home: House  Home Access: Stairs to enter  Secretary/administrator of Steps: 1  Entrance Stairs-Rails: None  Home Layout: Two  level;Bed/bath upstairs  Alternate Level Stairs-Number of Steps: 14  Alternate Level Stairs-Rails: Can reach both;Left  Bathroom Shower/Tub: Tub/shower unit;Curtain  Bathroom Toilet: Standard  Bathroom Accessibility: No  Home Adaptive Equipment: Walker - rolling  Additional Comments: Loaned uncle BSC,  rented hospital bed after last surgery as well as WC  Functional History:  Prior Function  Able to Take Stairs?: Yes  Driving: Yes  Vocation: On disability  Comments: Following last surgery, pt rented [facility] and hospital bed to live on main level, kept RW and BSC, but loaned BCS out to uncle  Functional Status:  Mobility:  Bed Mobility  Bed Mobility: Supine to Sit;Sitting - Scoot to Edge of Bed  Supine to Sit: 4: Min guard;HOB flat  Sitting - Scoot to [place] of Bed: 5: Supervision  Sit to Supine: 4: Min assist;With rail;HOB flat  Scooting to HOB: 4: Min guard  Transfers  Transfers: Sit to Stand;Stand to Sit  Sit to Stand: 3: Mod assist;With upper extremity assist;From bed  Stand to Sit: 4: Min assist;With upper extremity assist;With armrests;To chair/3-in-1  Stand Pivot Transfers: 3: Mod assist  Ambulation/Gait  Ambulation/Gait Assistance: 3: Mod assist  Ambulation Distance (Feet): 4 Feet  Assistive device: Rolling walker  Ambulation/Gait Assistance Details: Verbal and physical assist to safely use RW. Patient able to use UE's to hop step on LLE.  Gait Pattern: Step-to pattern  Gait velocity: slow gait speed  Stairs: No  Wheelchair Mobility  Wheelchair Mobility: No  ADL:  ADL  Eating/Feeding: Performed;Set up  Where Assessed - Eating/Feeding: Chair  Grooming: Performed;Wash/dry hands;Set up  Where Assessed - Grooming: Supported sitting  Upper Body Bathing: Simulated;Set up  Where Assessed - Upper Body Bathing: Unsupported sitting  Lower Body Bathing: Simulated;Moderate assistance  Where Assessed - Lower Body Bathing: Unsupported sit to stand  Upper Body Dressing:  Performed;Minimal assistance (donning gown)  Where Assessed - Upper Body Dressing: Unsupported sitting  Lower Body Dressing: Performed;Moderate assistance (pt donned left sock and assist with rt. LE)  Where Assessed - Lower Body Dressing: Unsupported sit to stand  Toilet Transfer: Simulated;Moderate assistance  Toilet Transfer Method: Stand pivot  Toilet Transfer Equipment: Other (comment) Nurse, children's)  Tub/Shower Transfer Method: Not assessed  Equipment Used: Rolling walker;Knee Immobilizer  Transfers/Ambulation Related to ADLs: Max verbal cues for hand placement and technique  ADL Comments: Pt. educated on technique of transfer and hand placement to increase pt. safety.  Cognition:  Cognition  Arousal/Alertness: Awake/alert  Orientation Level: Oriented X4  Cognition  Overall Cognitive Status: Appears within functional limits for tasks assessed/performed  Arousal/Alertness: Awake/alert  Orientation Level: Oriented X4 / Intact  Behavior During Session: WFL for tasks performed  Cognition - Other Comments: Pt. with OCD tendencies and very particular about placement of items during session  Blood pressure 147/71, pulse 89, temperature 98.1 F (36.7 C), temperature source Oral, resp. rate 18, height 5\' 7"  (1.702 m), weight 100 kg (220 lb 7.4 oz), last menstrual period 06/17/2011, SpO2 97.00%.  Physical Exam  Nursing note and vitals reviewed.  Constitutional: She is oriented to person, place, and time. She appears well-developed and well-nourished.  HENT:  Head: Normocephalic and atraumatic. Oral mucosa pink and moist. Dentition fair  Eyes: Pupils are equal, round, and reactive to light.  Neck: Normal range of motion.  Cardiovascular: Normal rate and regular rhythm. No murmurs, rubs, or gallops  Pulmonary/Chest: Effort normal and breath sounds normal. No wheezes, rales, or rhonchi  Abdominal: Soft. Bowel sounds positive, non-tender. Musculoskeletal:  R-BKA with dry dressing. Patient  extremely anxious regarding her wound--did not want dressing changed again. Left plantar surface with callus with soft central area --no drainage or tenderness.  Neurological: She is alert and oriented to person, place, and time. Strength grossly 5/5 in  the upper ext. RLE 3/5. LLE 4 to 4+/5. Sensory exam decreased in stocking glove distribution in intact limbs.  Skin: Skin is warm and dry.  Psychiatric: Her speech is normal. Judgment and thought content normal. Her mood appears anxious. Cognition and memory are normal.  CBC Status: Abnormal    Collection Time    07/08/11 6:08 AM   Component  Value  Range  Comment    WBC  8.0  4.0 - 10.5 K/uL  WHITE COUNT CONFIRMED ON SMEAR    RBC  3.20 (*)  3.87 - 5.11 MIL/uL     Hemoglobin  8.9 (*)  12.0 - 15.0 g/dL  REPEATED TO VERIFY    HCT  27.4 (*)  36.0 - 46.0 %     MCV  85.6  78.0 - 100.0 fL     MCH  27.8  26.0 - 34.0 pg     MCHC  32.5  30.0 - 36.0 g/dL     RDW  19.1 (*)  47.8 - 15.5 %     Platelets  347  150 - 400 K/uL  PLATELET COUNT CONFIRMED BY SMEAR   BASIC METABOLIC PANEL Status: Abnormal    Collection Time    07/08/11 6:08 AM   Component  Value  Range  Comment    Sodium  136  135 - 145 mEq/L     Potassium  3.5  3.5 - 5.1 mEq/L     Chloride  101  96 - 112 mEq/L     CO2  24  19 - 32 mEq/L     Glucose, Bld  128 (*)  70 - 99 mg/dL     BUN  7  6 - 23 mg/dL     Creatinine, Ser  2.95  0.50 - 1.10 mg/dL     Calcium  8.5  8.4 - 10.5 mg/dL     GFR calc non Af Amer  63 (*)  >90 mL/min     GFR calc Af Amer  73 (*)  >90 mL/min    MAGNESIUM Status: Normal    Collection Time    07/08/11 6:08 AM   Component  Value  Range  Comment    Magnesium  1.9  1.5 - 2.5 mg/dL    GLUCOSE, CAPILLARY Status: Abnormal    Collection Time    07/08/11 6:18 AM   Component  Value  Range  Comment    Glucose-Capillary  137 (*)  70 - 99 mg/dL     Comment 1  Notify RN      Comment 2  Documented in Chart     GLUCOSE, CAPILLARY Status: Abnormal    Collection Time    07/08/11  10:56 AM   Component  Value  Range  Comment    Glucose-Capillary  102 (*)  70 - 99 mg/dL    GLUCOSE, CAPILLARY Status: Normal    Collection Time    07/08/11 4:55 PM   Component  Value  Range  Comment    Glucose-Capillary  91  70 - 99 mg/dL    GLUCOSE, CAPILLARY Status: Normal    Collection Time    07/08/11 10:23 PM   Component  Value  Range  Comment    Glucose-Capillary  97  70 - 99 mg/dL    CBC Status: Abnormal    Collection Time    07/09/11 5:40 AM   Component  Value  Range  Comment    WBC  6.8  4.0 - 10.5 K/uL  RBC  3.41 (*)  3.87 - 5.11 MIL/uL     Hemoglobin  9.4 (*)  12.0 - 15.0 g/dL     HCT  16.1 (*)  09.6 - 46.0 %     MCV  85.6  78.0 - 100.0 fL     MCH  27.6  26.0 - 34.0 pg     MCHC  32.2  30.0 - 36.0 g/dL     RDW  04.5 (*)  40.9 - 15.5 %     Platelets  367  150 - 400 K/uL    MAGNESIUM Status: Normal    Collection Time    07/09/11 5:40 AM   Component  Value  Range  Comment    Magnesium  2.1  1.5 - 2.5 mg/dL    BASIC METABOLIC PANEL Status: Abnormal    Collection Time    07/09/11 5:40 AM   Component  Value  Range  Comment    Sodium  140  135 - 145 mEq/L     Potassium  3.6  3.5 - 5.1 mEq/L     Chloride  104  96 - 112 mEq/L     CO2  25  19 - 32 mEq/L     Glucose, Bld  95  70 - 99 mg/dL     BUN  8  6 - 23 mg/dL     Creatinine, Ser  8.11 (*)  0.50 - 1.10 mg/dL     Calcium  8.7  8.4 - 10.5 mg/dL     GFR calc non Af Amer  54 (*)  >90 mL/min     GFR calc Af Amer  63 (*)  >90 mL/min    GLUCOSE, CAPILLARY Status: Normal    Collection Time    07/09/11 6:54 AM   Component  Value  Range  Comment    Glucose-Capillary  75  70 - 99 mg/dL    VANCOMYCIN, TROUGH Status: Normal    Collection Time    07/09/11 10:00 AM   Component  Value  Range  Comment    Vancomycin Tr  20.0  10.0 - 20.0 ug/mL    Dg Chest Port 1 View  07/07/2011 *RADIOLOGY REPORT* Clinical Data: Fever. PORTABLE CHEST - 1 VIEW Comparison: Chest x-ray 07/01/2011. Findings: Lung volumes are low. Bibasilar opacities are  favored to represent areas of subsegmental atelectasis, however, there is a more focal medial left lower lobe opacity that could represent sequela of aspiration or airspace consolidation from infection. No pleural effusions. Pulmonary venous congestion without frank pulmonary edema. Mild cardiomegaly is unchanged. Mediastinal contours are unremarkable. IMPRESSION: 1. Low lung volumes with probable bibasilar subsegmental atelectasis. 2. Mild cardiomegaly. Original Report Authenticated By: Florencia Reasons, M.D.  Post Admission Physician Evaluation:  1. Functional deficits secondary to right BKA 2. Patient is admitted to receive collaborative, interdisciplinary care between the physiatrist, rehab nursing staff, and therapy team. 3. Patient's level of medical complexity and substantial therapy needs in context of that medical necessity cannot be provided at a lesser intensity of care such as a SNF. 4. Patient has experienced substantial functional loss from his/her baseline which was documented above under the "Functional History" and "Functional Status" headings. Judging by the patient's diagnosis, physical exam, and functional history, the patient has potential for functional progress which will result in measurable gains while on inpatient rehab. These gains will be of substantial and practical use upon discharge in facilitating mobility and self-care at the household level. 5. Physiatrist will provide 24  hour management of medical needs as well as oversight of the therapy plan/treatment and provide guidance as appropriate regarding the interaction of the two. 6. 24 hour rehab nursing will assist with bladder management, bowel management, safety, skin/wound care, disease management, medication administration, pain management and patient education and help integrate therapy concepts, techniques,education, etc. 7. PT will assess and treat for: Lower extremity strength, range of motion, functional mobility,  pre-prosthetic education, safety and pain management. Goals are: Modified independent. 8. OT will assess and treat for: Upper extremity strength, ADLs, functional mobility, safety. Goals are: Modified independent. 9. SLP will assess and treat for: not app 10. Case Management and Social Worker will assess and treat for psychological issues and discharge planning. 11. Team conference will be held weekly to assess progress toward goals and to determine barriers to discharge. 12. Patient will receive at least 3 hours of therapy per day at least 5 days per week. 13. ELOS: One week Prognosis: excellent Medical Problem List and Plan:  1. DVT Prophylaxis/Anticoagulation: Pharmaceutical: Lovenox  2. Pain Management: prn oxycodone effective.  3. Mood: Seems a little withdrawn and anxious. Educated regarding need for complete healing and that prosthesis likely couple of months down the road. Safety reiterated.  4. Neuropsych: This patient is capable of making decisions on his/her own behalf.  5. Left foot ulcer: healed. Pressure relief measures to prevent reopening/breakdown. The patient has custom insoles and shoes through [employer]. She should wear these with mobility attempts.  6. DM type 2: monitor with AC/HS cbg checks. Continue lantus  7. Fevers: resolving. IV antibiotics changed to po doxycycline today. Continue to monitor temp curve. Recheck CBC 07/08. Monitor wounds for drainage.  8. HTN: ACE discontinued due to renal insufficiency. Continue norvasc. Continue to monitor with bid checks.  9. Anemia: Continue iron supplement.  10. Constipation: Will add miralax as reports no BM since Monday? None recorded  Ivory Broad, MD  07/09/11

## 2011-07-11 NOTE — Progress Notes (Signed)
Occupational Therapy Session Note  Patient Details  Name: [name] MRN: [id number] Date of Birth: [birth year]  Today's Date: 07/11/2011 Time: 4098-1191 Time Calculation (min): 45 min  Short Term Goals: Week 1:  OT Short Term Goal 1 (Week 1): 1.  Pt. will be mod I with bathing OT Short Term Goal 2 (Week 1): Pt. will be mod I with dressing OT Short Term Goal 3 (Week 1): Pt. will be mod I with toileting OT Short Term Goal 4 (Week 1): Pt. will be mod I with toilet transfer  Skilled Therapeutic Interventions/Progress Updates:  Self care retraining to include, toilet transfer, toileting, sponge bath and dressing.  Patient performed squat pivot transfers w/c><toilet with 3 in 1 placed over it.  Patient chose to perform bath and dressing sitting EOB, lateral leans and stood to pull up her pants.  Patient often became anxious when faced with the need to adapt an ADL task yet was redirectable with encouragement and reassurance.  Patient does not appear to do well with change.  Precautions:  Precautions Precautions: Fall Precaution Comments: NWB R LE Required Braces or Orthoses: Knee Immobilizer - Right Restrictions Weight Bearing Restrictions: Yes RLE Weight Bearing: Non weight bearing Other Position/Activity Restrictions: Right LE Pain: Pain Assessment Pain Assessment: 0-10 Pain Score:   3 Pain Type: Surgical pain Pain Location: Leg Pain Orientation: Right Pain Descriptors: Aching Pain Frequency: Intermittent Pain Onset: Gradual Pain Intervention(s): Medication (See eMAR)  See FIM for current functional status  Therapy/Group: Individual Therapy  Brooklyne Radke 07/11/2011, 10:50 AM

## 2011-07-12 LAB — COMPREHENSIVE METABOLIC PANEL
ALT: 10 U/L (ref 0–35)
AST: 26 U/L (ref 0–37)
Albumin: 2.6 g/dL — ABNORMAL LOW (ref 3.5–5.2)
Alkaline Phosphatase: 59 U/L (ref 39–117)
BUN: 9 mg/dL (ref 6–23)
CO2: 27 mEq/L (ref 19–32)
Calcium: 9 mg/dL (ref 8.4–10.5)
Chloride: 104 mEq/L (ref 96–112)
Creatinine, Ser: 1.04 mg/dL (ref 0.50–1.10)
GFR calc Af Amer: 75 mL/min — ABNORMAL LOW (ref >90)
GFR calc non Af Amer: 64 mL/min — ABNORMAL LOW (ref >90)
Glucose, Bld: 136 mg/dL — ABNORMAL HIGH (ref 70–99)
Potassium: 3.5 mEq/L (ref 3.5–5.1)
Sodium: 140 mEq/L (ref 135–145)
Total Bilirubin: 0.4 mg/dL (ref 0.3–1.2)
Total Protein: 7.7 g/dL (ref 6.0–8.3)

## 2011-07-12 LAB — DIFFERENTIAL
Basophils Absolute: 0 10*3/uL (ref 0.0–0.1)
Basophils Relative: 0 % (ref 0–1)
Eosinophils Absolute: 0.1 10*3/uL (ref 0.0–0.7)
Eosinophils Relative: 2 % (ref 0–5)
Lymphocytes Relative: 21 % (ref 12–46)
Lymphs Abs: 1.3 10*3/uL (ref 0.7–4.0)
Monocytes Absolute: 0.4 10*3/uL (ref 0.1–1.0)
Monocytes Relative: 6 % (ref 3–12)
Neutro Abs: 4.5 10*3/uL (ref 1.7–7.7)
Neutrophils Relative %: 71 % (ref 43–77)

## 2011-07-12 LAB — GLUCOSE, CAPILLARY
Glucose-Capillary: 114 mg/dL — ABNORMAL HIGH (ref 70–99)
Glucose-Capillary: 134 mg/dL — ABNORMAL HIGH (ref 70–99)
Glucose-Capillary: 121 mg/dL — ABNORMAL HIGH (ref 70–99)
Glucose-Capillary: 95 mg/dL (ref 70–99)
Glucose-Capillary: 134 mg/dL — ABNORMAL HIGH (ref 70–99)

## 2011-07-12 LAB — CBC
HCT: 28.8 % — ABNORMAL LOW (ref 36.0–46.0)
Hemoglobin: 9.1 g/dL — ABNORMAL LOW (ref 12.0–15.0)
MCH: 26.9 pg (ref 26.0–34.0)
MCHC: 31.6 g/dL (ref 30.0–36.0)
MCV: 85.2 fL (ref 78.0–100.0)
Platelets: 442 10*3/uL — ABNORMAL HIGH (ref 150–400)
RBC: 3.38 MIL/uL — ABNORMAL LOW (ref 3.87–5.11)
RDW: 16 % — ABNORMAL HIGH (ref 11.5–15.5)
WBC: 6.3 10*3/uL (ref 4.0–10.5)

## 2011-07-12 NOTE — Progress Notes (Signed)
Social Work Assessment and Plan Social Work Assessment and Plan  Patient Details  Name: [name] MRN: [id number] Date of Birth: 07/12/1965  Today's Date: 07/12/2011  Problem List:  Patient Active Problem List  Diagnosis  . Diabetic foot ulcer associated with type 2 diabetes mellitus  . Hypertension  . Arthritis  . Neuropathy  . Intracerebral hemorrhage  . Diabetes mellitus  . Anemia  . Gas gangrene  . Sepsis  . Acute renal failure  . Unilateral complete BKA   Past Medical History:  Past Medical History  Diagnosis Date  . Diabetes mellitus   . Hypertension   . Arthritis   . Neuropathy   . Diabetic foot ulcer associated with type 2 diabetes mellitus   . Intracerebral hemorrhage As a teenager     States she had burst blood vessel as teenager, now with resultant minor visual field and hearing deficits   Past Surgical History:  Past Surgical History  Procedure Date  . Cesarean section   . Amputation 11/24/2010    Procedure: AMPUTATION FOOT;  Surgeon: [name], MD;  Location: [facility] OR;  Service: Orthopedics;  Laterality: Right;  Right  FOOT TRANS METATARSAL AMPUTATION  . I&d extremity 11/24/2010    Procedure: IRRIGATION AND DEBRIDEMENT EXTREMITY;  Surgeon: [name], MD;  Location: [facility] OR;  Service: Orthopedics;  Laterality: Left;  Debriedment of left plantar ulcer and trimming of toenails  . Amputation 07/02/2011    Procedure: AMPUTATION BELOW KNEE;  Surgeon: [name], MD;  Location: [facility] OR;  Service: Orthopedics;  Laterality: Right;  . Application of wound vac 07/02/2011    Procedure: APPLICATION OF WOUND VAC;  Surgeon: [name], MD;  Location: [facility] OR;  Service: Orthopedics;  Laterality: Right;  . I&d extremity 07/02/2011    Procedure: IRRIGATION AND DEBRIDEMENT EXTREMITY;  Surgeon: [name], MD;  Location: [facility] OR;  Service: Orthopedics;  Laterality: Left;  I & D of Left Foot  . I&d extremity 07/06/2011    Procedure: IRRIGATION AND DEBRIDEMENT EXTREMITY;   Surgeon: [name], MD;  Location: [facility] OR;  Service: Orthopedics;  Laterality: Right;  IRRIGATION/DEBRIDEMENT RIGHT BELOW KNEE AMPUTATION   Social History:  reports that she has never smoked. She does not have any smokeless tobacco history on file. She reports that she does not drink alcohol or use illicit drugs.  Family / Support Systems Marital Status: Widow/Widower How Long?: Oct 2012 Patient Roles: Parent Other Supports: [name]  [phone]  [phone] Anticipated Caregiver: Patient Ability/Limitations of Caregiver: Patient is her own caregiver along with her two children-7 & 9 Caregiver Availability: Other (Comment) (None) Family Dynamics: Parents and cousin are in [place].  Cousin is taking care of her children at this time.  She reports she has a good relationship with them. When asked if thought of moving where here family is. She reports: " I am buying my home I've been here 14 years."    Social History Preferred language: English Religion: Baptist Cultural Background: No issues Education: [employer] Read: Yes Write: Yes Employment Status: Unemployed Electrical engineer) Fish farm manager Issues: No issues Guardian/Conservator: None   Abuse/Neglect Physical Abuse: Denies Verbal Abuse: Denies Sexual Abuse: Denies Exploitation of patient/patient's resources: Denies Self-Neglect: Denies  Emotional Status Pt's affect, behavior adn adjustment status: Pt is able to explain her amputation and surgeries.  She has several concerns and reports feeling overwhelmed at times.  She is justifed in her stressors with all that has happened in the last 8 months.  Pt  is still grieving over the loss of her husband and the children's father.  She reports she take seach day a t time to get through it. Recent Psychosocial Issues: Other health issues-grief issues and providing care to her young children. Pyschiatric History: NO history- Depression Screen pt wanted to  defer due to feeling dizzy from therapies.  Will  postpone until later time.  Pt reports: " I do have a lot to think about."  Discussed to focus upon herself and her healing and let us work on the other concerns. Substance Abuse History: No history or issues  Patient / Family Perceptions, Expectations & Goals Pt/Family understanding of illness & functional limitations: Pt can explain her amputation, but thought she would have her prothesis by discharge from here.  She seems to need much education and the main issue will be her leg healing, so she can get a prothesis.  Discussed having cousin keep the kids unitl pt is able to provide care and comfortable with herself at home.  Or having pt go to [place] to stay with them, so someone is there. Premorbid pt/family roles/activities: Mother, Daughter, Home owner, etc Anticipated changes in roles/activities/participation: Plans to resume Pt/family expectations/goals: Pt states: " I want to take care of myself and my children."  She is working hard to achieve this.  She acknowledges many concerns but has difficulty problem solving for them.  She states: " Why would I move to [place]? "  [employer]: None Premorbid Home Care/DME Agencies: Other (Comment) (Has had [facility]) Transportation available at discharge: Unsure at this time-plans to hire for this Resource referrals recommended: Support group (specify);Other (Comment) (Amputee Support Group and [facility] for kids)  Discharge Planning Living Arrangements: Children Support Systems: Parent;Other relatives Type of Residence: Private residence Insurance Resources: [employer] (specify) [place] until 08/04/2011) Financial Resources: Other (Comment) (Children's Social Security) Financial Screen Referred: Yes Living Expenses: Motgage Money Management: Patient Do you have any problems obtaining your medications?: No Home Management: Patient Patient/Family  Preliminary Plans: Return home with children, needs to be independent level.  Pt does not seem to be realistic regarding her own care but also small children's care.  Discussed alternatives-family coming here of she going there upon discharge. Social Work Anticipated Follow Up Needs: HH/OP;Support Group;Other (comment) (Hired assist for transportation/home management) DC Planning Additional Notes/Comments: Many concerns regarding pt's care and children's care at discharge-no social supports close by closest one hour away.  Insurance ending end of the month, pt's finances-ie applying for disability and Medicaid.  Grief issues losing husband recently, children's grief issues also.  Will try to address while ehre.  Clinical Impression Very unfortuante woman who has experienced numerous losses in a short period of time.  Trying to focus on her children but needs to focus on her recovery and healing, so she is better prepared to provide care to them. Many issues to address while here, care of pt, care of her children, everyday tasks once home, supports, insurance, finances,etc.  Encouraged to get involved with [facility] for children and counselor for herself. Provide emotional support and information while here.  Lucy Chris 07/12/2011, 11:26 AM

## 2011-07-12 NOTE — Plan of Care (Signed)
Problem: RH SAFETY Goal: RH STG ADHERE TO SAFETY PRECAUTIONS W/ASSISTANCE/DEVICE STG Adhere to Safety Precautions independently  Outcome: Not Progressing Not calling appropriately for post-tioileting assitance.

## 2011-07-12 NOTE — Progress Notes (Signed)
Physical Therapy Session Note  Patient Details  Name: [name] MRN: [id number] Date of Birth: Jul 09, 1965  Today's Date: 07/12/2011 Time: 9:20-10:20  (60 min)    Skilled Therapeutic Interventions/Progress Updates:    Pt received sitting EOB.  Pt insisted on dressing on her own before getting up for therapy.  Pt required assistance to pull pants up standing in RW with min A for balance.  Sitting EOM<> w/c with stand-pivot transfer using RW.  Pt asked to go to the bathroom, requiring min A for w/c<>toilet transfers using grab bars.  Pt was able to perform all self-care independently.  Tx focused on w/c propulsion for improved UE strengthening, endurance, and activity tolerance.  Educated pt on stretching, increased risk of knee flexion contraction, and w/c parts and management.  Gait x 35' in RW with min A and frequent cueing, decreased hip and knee flexion of LLE noted.  Pt fatigues quickly due to decreased cardiorespiratory fitness, LLE strength, and UE strength.  Pt self-propelled w/c back to room for endurance.  Let in room with call bell in reach.  Therapy Documentation Precautions:  Precautions Precautions: Fall Precaution Comments: NWB R LE Required Braces or Orthoses: Knee Immobilizer - Right Restrictions Weight Bearing Restrictions: Yes RLE Weight Bearing: Non weight bearing Other Position/Activity Restrictions: Right LE  Pain: No c/o pain during therapy.  See FIM for current functional status  Therapy/Group: Individual Therapy  [name] 07/12/2011, 9:43 AM

## 2011-07-12 NOTE — Progress Notes (Signed)
Inpatient Rehabilitation Center Individual Statement of Services  Patient Name:  [name]  Date:  07/12/2011  Welcome to the Inpatient Rehabilitation Center.  Our goal is to provide you with an individualized program based on your diagnosis and situation, designed to meet your specific needs.  With this comprehensive rehabilitation program, you will be expected to participate in at least 3 hours of rehabilitation therapies Monday-Friday, with modified therapy programming on the weekends.  Your rehabilitation program will include the following services:  Physical Therapy (PT), Occupational Therapy (OT), 24 hour per day rehabilitation nursing, Therapeutic Recreaction (TR), Case Management (RN and Child psychotherapist), Rehabilitation Medicine, Nutrition Services and Pharmacy Services  Weekly team conferences will be held on Tuesdays to discuss your progress.  Your RN Case Designer, television/film set will talk with you frequently to get your input and to update you on team discussions.  Team conferences with you and your family in attendance may also be held.  Expected length of stay: 7-10 days   Overall anticipated outcome: Modified independent  Depending on your progress and recovery, your program may change.  Your RN Case Estate agent will coordinate services and will keep you informed of any changes.  Your RN Sports coach and SW names and contact numbers are listed  below.  The following services may also be recommended but are not provided by the Inpatient Rehabilitation Center:   Driving Evaluations  Home Health Rehabiltiation Services  Outpatient Rehabilitatation [facility]  Vocational Rehabilitation   Arrangements will be made to provide these services after discharge if needed.  Arrangements include referral to agencies that provide these services.  Your insurance has been verified to be:  [facility] Your primary doctor is:  Dr Hewitt Shorts Fulp  Pertinent information will be shared  with your doctor and your insurance company.  Case Manager: Melanee Spry, [facility] [phone]  Social Worker:  [place], [place] [phone]  Information discussed with and copy given to patient by: Meryl Dare, 07/12/2011

## 2011-07-12 NOTE — Progress Notes (Signed)
Patient ID: [name], female   DOB: Nov 19, 1965, 46 y.o.   MRN: [id number] Subjective/Complaints: Had a good weekend. Denies any problems this am. Pain under contorl .Marland KitchenA 12 point review of systems has been performed and if not noted above is otherwise negative.   Objective: Vital Signs: Blood pressure 144/67, pulse 87, temperature 98.7 F (37.1 C), temperature source Oral, resp. rate 18, weight 95.1 kg (209 lb 10.5 oz), last menstrual period 06/17/2011, SpO2 97.00%. No results found.  Basename 07/12/11 0545  WBC 6.3  HGB 9.1*  HCT 28.8*  PLT 442*    Basename 07/12/11 0545  NA 140  K 3.5  CL 104  CO2 27  GLUCOSE 136*  BUN 9  CREATININE 1.04  CALCIUM 9.0   CBG (last 3)   Basename 07/12/11 0719 07/11/11 2055 07/11/11 1754  GLUCAP 134* 135* 144*    Wt Readings from Last 3 Encounters:  07/12/11 95.1 kg (209 lb 10.5 oz)  07/09/11 100 kg (220 lb 7.4 oz)  07/09/11 100 kg (220 lb 7.4 oz)    Physical Exam:  .. General: Alert and oriented x 3 HEENT: Head is normocephalic, atraumatic, PERRLA, EOMI, sclera anicteric, oral mucosa pink and moist, dentition intact, ext ear canals clear,  Neck: Supple without JVD or lymphadenopathy Heart: Reg rate and rhythm. No murmurs rubs or gallops Chest: CTA bilaterally without wheezes, rales, or rhonchi; no distress Abdomen: Soft, non-tender, non-distended, bowel sounds positive. Extremities: No clubbing, cyanosis, or edema. Pulses are 2+ Skin: Clean and intact wound. Callous/wound  on the left MT heads Neuro: Pt is cognitively appropriate with normal insight, memory, and awareness. Cranial nerves 2-12 are intact. Sensory exam is notable for mild stocking glove sensory loss. distally. Reflexes are 2+ in all 4's. Fine motor coordination is intact. No tremors. Motor function is grossly 5/5 except at affected limb.  Musculoskeletal: Full ROM, No pain with AROM or PROM in the neck, trunk, or extremities. Posture appropriate. Right BKA is  clean and well shaped. Only mild drainage from the wound. Psych: Pt's affect is appropriate. Pt is cooperative    Assessment/Plan: 1. Functional deficits secondary to right BKA which require 3+ hours per day of interdisciplinary therapy in a comprehensive inpatient rehab setting. Physiatrist is providing close team supervision and 24 hour management of active medical problems listed below. Physiatrist and rehab team continue to assess barriers to discharge/monitor patient progress toward functional and medical goals. FIM: FIM - Bathing Bathing Steps Patient Completed: Chest;Right Arm;Left Arm;Abdomen;Front perineal area;Buttocks;Right upper leg;Left upper leg Bathing: 5: Set-up assist to: Obtain items (performed in bed with lateral leans)  FIM - Upper Body Dressing/Undressing Upper body dressing/undressing steps patient completed: Put head through opening of pull over shirt/dress;Pull shirt over trunk;Thread/unthread left sleeve of pullover shirt/dress;Thread/unthread right sleeve of pullover shirt/dresss Upper body dressing/undressing: 5: Set-up assist to: Obtain clothing/put away FIM - Lower Body Dressing/Undressing Lower body dressing/undressing steps patient completed: Thread/unthread right underwear leg;Thread/unthread left underwear leg;Pull underwear up/down;Thread/unthread left pants leg;Don/Doff left sock Lower body dressing/undressing: 3: Mod-Patient completed 50-74% of tasks  FIM - Toileting Toileting steps completed by patient: Adjust clothing prior to toileting;Performs perineal hygiene;Adjust clothing after toileting Toileting Assistive Devices: Grab bar or rail for support Toileting: 5: Supervision: Safety issues/verbal cues  FIM - Diplomatic Services operational officer Devices: Walker;Elevated toilet seat;Grab bars Toilet Transfers: 4-To toilet/BSC: Min A (steadying Pt. > 75%);4-From toilet/BSC: Min A (steadying Pt. > 75%)  FIM - Event organiser Devices: Manufacturing systems engineer  Transfer: 4: Bed > Chair or W/C: Min A (steadying Pt. > 75%);4: Chair or W/C > Bed: Min A (steadying Pt. > 75%)  FIM - Locomotion: Wheelchair Locomotion: Wheelchair: 0: Activity did not occur FIM - Locomotion: Ambulation Locomotion: Ambulation Assistive Devices: Designer, industrial/product Ambulation/Gait Assistance: 3: Mod assist Locomotion: Ambulation: 1: Travels less than 50 ft with moderate assistance (Pt: 50 - 74%)  Comprehension Comprehension Mode: Auditory Comprehension: 5-Understands basic 90% of the time/requires cueing < 10% of the time  Expression Expression Mode: Verbal Expression: 5-Expresses basic 90% of the time/requires cueing < 10% of the time.  Social Interaction Social Interaction: 5-Interacts appropriately 90% of the time - Needs monitoring or encouragement for participation or interaction.  Problem Solving Problem Solving: 4-Solves basic 75 - 89% of the time/requires cueing 10 - 24% of the time  Memory Memory: 5-Recognizes or recalls 90% of the time/requires cueing < 10% of the time  1. DVT Prophylaxis/Anticoagulation: Pharmaceutical: Lovenox  2. Pain Management: prn oxycodone effective.  3. Mood: Seems a little withdrawn and anxious. Educated regarding need for complete healing and that prosthesis likely couple of months down the road. Safety reiterated.  4. Neuropsych: This patient is capable of making decisions on his/her own behalf.  5. Left foot ulcer: healed. Pressure relief measures to prevent reopening/breakdown. The patient has custom insoles and shoes through [employer]. She should wear these with mobility attempts.  6. DM type 2: monitor with AC/HS cbg checks. Continue lantus- fair control thus far.  7. Fevers: resolving. IV antibiotics changed to po doxycycline today. Continue to monitor temp curve. Recheck CBC 07/08. Monitor wounds for drainage.  8. HTN: ACE discontinued due to renal insufficiency. Continue norvasc.  Continue to monitor with bid checks.  9. Anemia: Continue iron supplement. hgb stable  LOS (Days) 3 A FACE TO FACE EVALUATION WAS PERFORMED  [name] T 07/12/2011, 8:06 AM

## 2011-07-12 NOTE — Plan of Care (Signed)
Problem: RH BOWEL ELIMINATION Goal: RH STG MANAGE BOWEL WITH ASSISTANCE STG Manage Bowel with mod independence  Outcome: Not Progressing Pt had BM today after sorbitol first since 6/27, but continued to refuse senna and states she does not want to take a laxative such as this

## 2011-07-12 NOTE — Progress Notes (Signed)
Occupational Therapy Session Notes  Patient Details  Name: [name] MRN: [id number] Date of Birth: 1965/01/18  Today's Date: 07/12/2011  Short Term Goals: Week 1:  OT Short Term Goal 1 (Week 1): 1.  Pt. will be mod I with bathing OT Short Term Goal 2 (Week 1): Pt. will be mod I with dressing OT Short Term Goal 3 (Week 1): Pt. will be mod I with toileting OT Short Term Goal 4 (Week 1): Pt. will be mod I with toilet transfer  Skilled Therapeutic Interventions/Progress Updates:   Session #1 [id number] - 40 Minutes Individual Therapy No complaints of pain Patient found seated in w/c with clothes donned. Patient stated she already completed bathing and dressing prior to OT session. Skilled intervention focusing on functional ambulation from w/c -> BSC seated over toilet seat in BR using rolling walker with min assist for ambulation, min assist for toilet transfer, and steady assist for toileting (peri care & clothing management). Patient used grab bars while pulling pants up to waist, recommend patient install grab bar at home or use steady surface during toileting. Also recommend patient sit for fastening pants instead of standing and trying to maintain balance on one leg. Patient ambulated from toilet -> w/c with min assist. Then focused on UE strengthening using theraband. Gave patient HEP and recommend patient establish a routine to perform HEP for UE strengthening & endurance.   Session #2 [id number] - 25 Minutes Individual Therapy No complaints of pain Patient seated in w/c eating lunch and required encouragement to set lunch aside and engage in therapy; patient had lunch tray since 1200. Patient with complaints of thermostat not working; Therapist, nutritional to notify facilities. Engaged in UE exercises using therband. Patient with great carryover regarding exercises taught in previous session. Min cues needed for body mechanics/overall form. Patient left with lunch tray, call  bell & phone within reach.   Precautions:  Precautions Precautions: Fall Precaution Comments: NWB R LE Required Braces or Orthoses: Knee Immobilizer - Right Restrictions Weight Bearing Restrictions: Yes RLE Weight Bearing: Non weight bearing Other Position/Activity Restrictions: Right LE  See FIM for current functional status  Taijuan Serviss 07/12/2011, 12:12 PM

## 2011-07-12 NOTE — Progress Notes (Signed)
Reviewed and in agreement with treatment provided.  

## 2011-07-12 NOTE — Evaluation (Signed)
Recreational Therapy Assessment and Plan  Patient Details  Name: [name] MRN: [id number] Date of Birth: 1965-08-13 Today's Date: 07/12/2011  Rehab Potential: Good ELOS: 10 days   Assessment Clinical Impression: Problem List:  Patient Active Problem List   Diagnosis   .  Diabetic foot ulcer associated with type 2 diabetes mellitus   .  Hypertension   .  Arthritis   .  Neuropathy   .  Intracerebral hemorrhage   .  Diabetes mellitus   .  Anemia   .  Gas gangrene   .  Sepsis   .  Acute renal failure   .  Unilateral complete BKA    Past Medical History:  Past Medical History   Diagnosis  Date   .  Diabetes mellitus    .  Hypertension    .  Arthritis    .  Neuropathy    .  Diabetic foot ulcer associated with type 2 diabetes mellitus    .  Intracerebral hemorrhage  As a teenager     States she had burst blood vessel as teenager, now with resultant minor visual field and hearing deficits    Past Surgical History:  Past Surgical History   Procedure  Date   .  Cesarean section    .  Amputation  11/24/2010     Procedure: AMPUTATION FOOT; Surgeon: [name], MD; Location: [facility] OR; Service: Orthopedics; Laterality: Right; Right FOOT TRANS METATARSAL AMPUTATION   .  I&d extremity  11/24/2010     Procedure: IRRIGATION AND DEBRIDEMENT EXTREMITY; Surgeon: [name], MD; Location: [facility] OR; Service: Orthopedics; Laterality: Left; Debriedment of left plantar ulcer and trimming of toenails   .  Amputation  07/02/2011     Procedure: AMPUTATION BELOW KNEE; Surgeon: [name], MD; Location: [facility] OR; Service: Orthopedics; Laterality: Right;   .  Application of wound vac  07/02/2011     Procedure: APPLICATION OF WOUND VAC; Surgeon: [name], MD; Location: [facility] OR; Service: Orthopedics; Laterality: Right;   .  I&d extremity  07/02/2011     Procedure: IRRIGATION AND DEBRIDEMENT EXTREMITY; Surgeon: [name], MD; Location: [facility] OR; Service: Orthopedics; Laterality: Left; I & D of Left Foot    .  I&d extremity  07/06/2011     Procedure: IRRIGATION AND DEBRIDEMENT EXTREMITY; Surgeon: [name], MD; Location: [facility] OR; Service: Orthopedics; Laterality: Right; IRRIGATION/DEBRIDEMENT RIGHT BELOW KNEE AMPUTATION    Assessment & Plan  Clinical Impression: Patient is a 46 y.o. year old female with recent admission to the hospital on 6/28 with significant increase in drainage in right foot. Had history of DM with right transmet foot amputation for diabetic foot ulcer and poorly healing wound and refusing multiple attempts at hospital for treatment Patient upon admission noted to be septic with hypotension and tachycardia. On 7/22 pt taken back to OR for I and D with Right BKA revision and VAC application. Patient transferred to CIR on 07/09/2011 .   Pt presents with decreased activity tolerance, decreased functional mobility, decreased balance limiting pt's independence with community pursuits.  Leisure History/Participation Premorbid leisure interest/current participation: [place] - Flower gardening;Community - Grocery store;Community - Engineer, civil (consulting) (play with kids) Other Leisure Interests: Television;Movies;Reading;Cooking/Baking Leisure Participation Style: Alone;With Family/Friends ("stay to myself" ) Awareness of Community Resources: Good-identify 3 post discharge leisure resources Psychosocial / Spiritual Social interaction - Mood/Behavior: Cooperative Firefighter Appropriate for Education?: Yes Patient Agreeable to Outing?: Yes Recreational Therapy Orientation Orientation -Reviewed with patient: Available activity resources Strengths/Weaknesses Patient Strengths/Abilities:  Willingness to participate Patient weaknesses: Physical limitations  Plan Rec Therapy Plan Is patient appropriate for Therapeutic Recreation?: Yes Rehab Potential: Good Treatment times per week: Min 1 time for community reintegration Estimated Length of Stay: 10 days TR Treatment/Interventions:  Adaptive equipment instruction;Community reintegration;Functional mobility training;Therapeutic activities;Wheelchair propulsion/positioning  Recommendations for other services: none Discharge Criteria: Patient will be discharged from TR if patient refuses treatment 3 consecutive times without medical reason.  If treatment goals not met, if there is a change in medical status, if patient makes no progress towards goals or if patient is discharged from hospital.  The above assessment, treatment plan, treatment alternatives and goals were discussed and mutually agreed upon: by patient  [name] 07/12/2011, 3:53 PM

## 2011-07-12 NOTE — Progress Notes (Signed)
Patient information reviewed and entered into UDS-PRO system by Ally Knodel, RN, CRRN, PPS Coordinator.  Information including medical coding and functional independence measure will be reviewed and updated through discharge.    

## 2011-07-12 NOTE — Progress Notes (Signed)
Physical Therapy Session Note  Patient Details  Name: JANEAL ABADI MRN: [id number] Date of Birth: 1965-07-28  Today's Date: 07/12/2011 Time: 1530-1600 Time Calculation (min): 30 min  Skilled Therapeutic Interventions/Progress Updates:    Pt self-propelled w/c for improved endurance and UE strengthening.  Pt was able negotiate obstacles and problem-solve with increased time and minimal verbal cues to w/c part management.  Tx primarily focused on RLE strengthening with hip flexion, hip abduction in sidelying, and knee extension exercises 2x15 reps each.  Pt tolerated exercises well but was fatigued with only 3 exercises.  Explained exercise progression and importance of therex to pt.  Will continue to add therex over the next few sessions.    Therapy Documentation Precautions:  Precautions Precautions: Fall Precaution Comments: NWB R LE Required Braces or Orthoses: Knee Immobilizer - Right Restrictions Weight Bearing Restrictions: Yes RLE Weight Bearing: Non weight bearing Other Position/Activity Restrictions: Right LE  Pain: Pt reports 0/10 pain-premedicated.  See FIM for current functional status  Therapy/Group: Individual Therapy  [name] 07/12/2011, 4:10 PM

## 2011-07-12 NOTE — Progress Notes (Signed)
Physical Therapy Note  Patient Details  Name: [name] MRN: [id number] Date of Birth: 01/08/1965 Today's Date: 07/12/2011  Time: 1445-1519 34 minutes  No c/o pain. Pt c/o itching in residual limb, RN made aware.  W/c mobility with supervision in controlled environment, min A household environment for turns.  Gait training 20', 10' x 2 with RW, min A, cues for safety and balance with turns.  Standing balance with reaching task.  Pt with good static standing balance and standing tolerance up to 2 minutes.  Toilet transfer with grab bars with min A, cues for safety.  Pt requires frequent cuing for sequencing and safety with all functional mobility tasks.  Individual therapy   Akeelah Seppala 07/12/2011, 3:20 PM

## 2011-07-12 NOTE — Progress Notes (Signed)
Alert and orientated x 4 with memory deficit noted. Pt slow to process at time. Despite numerous attempt to explain importance of laxative, pt continue to decline, and can be argumentative regarding subject. States  "I'm fine, i've had a bowel movement, I don't want that."   .... Last bowel movement 07/11/11 after Sorbitol. Prior bowel movement 07/01/11. However, pt unable to understand importance of regular bowel movement to prevent constipation esecially with use of opiates. Poor safety awareness. Nurse tech reported that pt calls after she has performed perineal care, don/doff clothing, and has transferred back to w/c. Education provided regarding safety, and need to allow staff to assist with transfers. R AKA with sutures approximated. Moderate amount of serosanguinous drainage noted from mid-incisional line. Area cleaned, and site redressed. Pain managed with Oxy IR 15mg  q 3hrs, prn. L heel callous, with closed wound noted to great toe. Saff monitor to ensure area remains closed.

## 2011-07-13 LAB — GLUCOSE, CAPILLARY
Glucose-Capillary: 146 mg/dL — ABNORMAL HIGH (ref 70–99)
Glucose-Capillary: 133 mg/dL — ABNORMAL HIGH (ref 70–99)
Glucose-Capillary: 119 mg/dL — ABNORMAL HIGH (ref 70–99)
Glucose-Capillary: 133 mg/dL — ABNORMAL HIGH (ref 70–99)

## 2011-07-13 NOTE — Progress Notes (Signed)
Physical Therapy Session Note  Patient Details  Name: [name] MRN: [id number] Date of Birth: 1965/06/26  Today's Date: 07/13/2011 Time: 1520-1605 Time Calculation (min): 45 min  Skilled Therapeutic Interventions/Progress Updates:  Pt received finishing conversation with social worker regarding d/c plans and update from team conference.  Pt states she wants to go home Friday (Saturday at the absolute latest) in order to pay bills and stating that she does need to be here any longer.  Educated pt on current status, therapy goals, and continued progression needed before returning home.  Pt asked to use bathroom, gait with RW x 15' into bathroom.  One standing by toilet, pt refused to let PT stay for min guard while adjusting clothing.  When therapist notified pt that she was staying to assist for her safety until the pt was seated, pt refused to adjust clothing and sat with pants on.  Therapist continued to educate pt on her falls risk and need for assistance with balance in RW for safety- pt agreed but then proceeded to stand and dress herself following urinating without alerting the therapist despite strict instructions to do so.  Pt self-propelled w/c >150' with continued cueing for w/c parts/management.  Demonstrated 1 curb step using RW for pt using forwards and backwards technique.  Pt refused to attempt curb step (needed to get in front of house) due to anxiety and fear.  Pt reports that she can get into house through garage (approx 1-2" threshold per report).  Gait x 40' in RW over obstacles in order to simulate garage-entry with frequent cueing and mod A for balance while living RW over objects.  Pt continually reports "I won't need to do any of this when I go home" despite education on how therapy will carry over into home environment as well as "I won't need anyone to help me" despite therapists recommendations and education of safety.   Therapy Documentation Precautions:   Precautions Precautions: Fall Precaution Comments: NWB R LE Required Braces or Orthoses: Knee Immobilizer - Right Restrictions Weight Bearing Restrictions: Yes RLE Weight Bearing: Non weight bearing Other Position/Activity Restrictions: Right LE Pain: No c/o pain- pt premedicated. See FIM for current functional status  Therapy/Group: Individual Therapy  [name] 07/13/2011, 4:12 PM

## 2011-07-13 NOTE — Progress Notes (Signed)
Social Work Patient ID: [name], female   DOB: 1965/03/17, 46 y.o.   MRN: [id number] Met with pt to inform of team conference goals-mod/i w/c level and supervision for ambulating.  Discharge date 7/15. Pt feels this is too long and wants to be home by Friday or Sat, due to she has bills to pay at home.  Discussed will talk with Therapists to discuss if this is a realistic goal.  Pt states: " I am learning things each day that is long enough."  She will contact cousin And parents to see about transportation home and if can stay for a few days.  Discussed Home Health she has had [facility] pref them again.  Aware needs some DME wants all delivered home.  Gave her list to hire home management assist.  She will pursue. Pt feels at home she can do what she is doing here, also bills are due before the 15th.  She will contact family tonight and social worker will Check back in the am, regarding results.  Work toward a safe discharge.  Pt appears to be stressing over the bills that are due and not her care or Rehab here.  Discussed Medicaid and SSD to apply prior to discharge.

## 2011-07-13 NOTE — Progress Notes (Signed)
Physical Therapy Session Note  Patient Details  Name: [name] MRN: [id number] Date of Birth: January 06, 1965  Today's Date: 07/13/2011 Time: 1100-1200 Time Calculation (min): 60 min  Skilled Therapeutic Interventions/Progress Updates:    w/c propulsion on unit for endurance and strengthening with overall supervision. Gait training with RW with steady A x 20' with some sidestepping next to mat. Standing therex for R hip extension and flexion x 10 reps x 2 sets. Car transfer training with RW with min A initially progressing to close supervision with cues for safety. Practiced squat pivot to car in case of needing to transfer without RW, but pt with difficulty problem solving and carryover of technique.  Therapy Documentation Precautions:  Precautions Precautions: Fall Precaution Comments: NWB R LE Required Braces or Orthoses: Knee Immobilizer - Right Restrictions Weight Bearing Restrictions: Yes RLE Weight Bearing: Non weight bearing Other Position/Activity Restrictions: Right LE    Pain:  No complaints of pain. Reports she threw up earlier but feels ok. RN notified.   Locomotion : Ambulation Ambulation/Gait Assistance: 4: Min guard   See FIM for current functional status  Therapy/Group: Individual Therapy  Karolee Stamps [facility] 07/13/2011, 12:10 PM

## 2011-07-13 NOTE — Progress Notes (Signed)
Reviewed and in agreement with treatment provided.  

## 2011-07-13 NOTE — Progress Notes (Signed)
Occupational Therapy Session Notes  Patient Details  Name: [name] MRN: [id number] Date of Birth: 07-25-1965  Today's Date: 07/13/2011  Short Term Goals: Week 1:  OT Short Term Goal 1 (Week 1): 1.  Pt. will be mod I with bathing OT Short Term Goal 2 (Week 1): Pt. will be mod I with dressing OT Short Term Goal 3 (Week 1): Pt. will be mod I with toileting OT Short Term Goal 4 (Week 1): Pt. will be mod I with toilet transfer  Skilled Therapeutic Interventions/Progress Updates:   Session #1 4098-1191 - 55 Minutes Individual Therapy No complaints of pain Patient found seated in w/c beside bed. Patient requested to perform bathing at bed level. Patient very private during bathing & dressing, demanding that the therapist stand on other side of curtain. Therapist reiterated importance of supervision/steady assist during standing ADLs, patient agreed and notified therapist of when she needed to stand. Initially patient stood without rolling walker to pull underwear up to waist, when patient went to pull pants up therapist recommended patient have rolling walker in front of her. Also discussed fastening pants in seated position instead of standing without UE support (patient did not carryover recommendation from yesterday, but remembered when therapist discussed with her). After ADL at bed level patient performed stand pivot transfer from edge of bed -> w/c with close supervision from grooming tasks sitting at sink in w/c. Patient left seated in w/c with RLE elevated and knee extended and with call bell & phone within reach.   Session #2 1330-1400 - 30 Minutes Individual Therapy No complaints of pain Upon entering room patient seated in w/c, emotional, and stated she was worried about "things". Therapist provided emotional support and started discussing d/c planning regarding safety and ambulation using rolling walker. Recommend patient use BSC beside bed at night time at home for safety and  to help eliminate some anxiousness. Recommend patient routinely ambulate in/out of bathroom using rolling walker for toileting while on CIR to help increase overall activity tolerance/endurance, to work on overall safety with rolling walker, and to help increase confidence. Also educated, demonstrated, and had patient return demonstrate compression wrapping. Patient required mod physical assist with wrapping and mod verbal cues at this time. Plan to continue educating on compression wrapping. Left patient seated in w/c with call bell & phone within reach.   Precautions:  Precautions Precautions: Fall Precaution Comments: NWB R LE Required Braces or Orthoses: Knee Immobilizer - Right Restrictions Weight Bearing Restrictions: Yes RLE Weight Bearing: Non weight bearing Other Position/Activity Restrictions: Right LE  See FIM for current functional status  Adalia Pettis 07/13/2011, 10:03 AM

## 2011-07-13 NOTE — Progress Notes (Signed)
Patient ID: [name], female   DOB: 1965/06/10, 46 y.o.   MRN: [id number] Patient ID: [name], female   DOB: 04-10-1965, 47 y.o.   MRN: [id number] Subjective/Complaints: Had a good day. Worried about how she'll do a car transfer! Marland Kitchen.A 12 point review of systems has been performed and if not noted above is otherwise negative.   Objective: Vital Signs: Blood pressure 132/62, pulse 96, temperature 98.6 F (37 C), temperature source Oral, resp. rate 18, weight 95.1 kg (209 lb 10.5 oz), last menstrual period 06/17/2011, SpO2 94.00%. No results found.  Basename 07/12/11 0545  WBC 6.3  HGB 9.1*  HCT 28.8*  PLT 442*    Basename 07/12/11 0545  NA 140  K 3.5  CL 104  CO2 27  GLUCOSE 136*  BUN 9  CREATININE 1.04  CALCIUM 9.0   CBG (last 3)   Basename 07/13/11 0714 07/12/11 2111 07/12/11 1626  GLUCAP 146* 134* 95    Wt Readings from Last 3 Encounters:  07/12/11 95.1 kg (209 lb 10.5 oz)  07/09/11 100 kg (220 lb 7.4 oz)  07/09/11 100 kg (220 lb 7.4 oz)    Physical Exam:  .. General: Alert and oriented x 3 HEENT: Head is normocephalic, atraumatic, PERRLA, EOMI, sclera anicteric, oral mucosa pink and moist, dentition intact, ext ear canals clear,  Neck: Supple without JVD or lymphadenopathy Heart: Reg rate and rhythm. No murmurs rubs or gallops Chest: CTA bilaterally without wheezes, rales, or rhonchi; no distress Abdomen: Soft, non-tender, non-distended, bowel sounds positive. Extremities: No clubbing, cyanosis, or edema. Pulses are 2+ Skin: Clean and intact wound. Callous/wound  on the left MT heads 1-2 Neuro: Pt is cognitively appropriate with normal insight, memory, and awareness. Cranial nerves 2-12 are intact. Sensory exam is notable for mild stocking glove sensory loss. distally. Reflexes are 2+ in all 4's. Fine motor coordination is intact. No tremors. Motor function is grossly 5/5 except at affected limb.  Musculoskeletal: Full ROM, No pain with AROM or  PROM in the neck, trunk, or extremities. Posture appropriate. Right BKA is clean and well shaped. Only mild drainage from the wound. Psych: Pt's affect is appropriate. Pt is cooperative    Assessment/Plan: 1. Functional deficits secondary to right BKA which require 3+ hours per day of interdisciplinary therapy in a comprehensive inpatient rehab setting. Physiatrist is providing close team supervision and 24 hour management of active medical problems listed below. Physiatrist and rehab team continue to assess barriers to discharge/monitor patient progress toward functional and medical goals. FIM: FIM - Bathing Bathing Steps Patient Completed: Chest;Right Arm;Left Arm;Abdomen;Front perineal area;Buttocks;Right upper leg;Left upper leg Bathing: 0: Activity did not occur  FIM - Upper Body Dressing/Undressing Upper body dressing/undressing steps patient completed: Put head through opening of pull over shirt/dress;Pull shirt over trunk;Thread/unthread left sleeve of pullover shirt/dress;Thread/unthread right sleeve of pullover shirt/dresss Upper body dressing/undressing: 0: Activity did not occur FIM - Lower Body Dressing/Undressing Lower body dressing/undressing steps patient completed: Thread/unthread right underwear leg;Thread/unthread left underwear leg;Pull underwear up/down;Thread/unthread left pants leg;Don/Doff left sock Lower body dressing/undressing: 0: Activity did not occur  FIM - Toileting Toileting steps completed by patient: Adjust clothing prior to toileting;Performs perineal hygiene;Adjust clothing after toileting Toileting Assistive Devices: Grab bar or rail for support Toileting: 4: Steadying assist  FIM - Diplomatic Services operational officer Devices: Bedside commode;Grab bars;Walker Toilet Transfers: 4-To toilet/BSC: Min A (steadying Pt. > 75%);4-From toilet/BSC: Min A (steadying Pt. > 75%)  FIM - Banker  Devices:  Therapist, occupational: 4: Bed > Chair or W/C: Min A (steadying Pt. > 75%)  FIM - Locomotion: Wheelchair Locomotion: Wheelchair: 2: Travels 50 - 149 ft with supervision, cueing or coaxing FIM - Locomotion: Ambulation Locomotion: Ambulation Assistive Devices: Designer, industrial/product Ambulation/Gait Assistance: 4: Min assist Locomotion: Ambulation: 1: Travels less than 50 ft with minimal assistance (Pt.>75%)  Comprehension Comprehension Mode: Auditory Comprehension: 5-Understands complex 90% of the time/Cues < 10% of the time  Expression Expression Mode: Verbal Expression: 5-Expresses complex 90% of the time/cues < 10% of the time  Social Interaction Social Interaction: 4-Interacts appropriately 75 - 89% of the time - Needs redirection for appropriate language or to initiate interaction.  Problem Solving Problem Solving: 5-Solves basic problems: With no assist  Memory Memory: 4-Recognizes or recalls 75 - 89% of the time/requires cueing 10 - 24% of the time  1. DVT Prophylaxis/Anticoagulation: Pharmaceutical: Lovenox  2. Pain Management: prn oxycodone effective.  3. Mood: positive reinforcement by team. 4. Neuropsych: This patient is capable of making decisions on his/her own behalf.  5. Left foot ulcer: healed. Pressure relief measures to prevent reopening/breakdown. The patient has custom insoles and shoes through [employer]. She should wear these with mobility attempts.  6. DM type 2: monitor with AC/HS cbg checks. Continue lantus- fair control thus far.  7. Fevers: resolving. IV antibiotics changed to po doxycycline. Afebrile.   Monitor wounds for drainage.  8. HTN: ACE discontinued due to renal insufficiency. Continue norvasc. Continue to monitor with bid checks.  9. Anemia: Continue iron supplement. hgb stable  LOS (Days) 4 A FACE TO FACE EVALUATION WAS PERFORMED  SWARTZ,ZACHARY T 07/13/2011, 7:30 AM

## 2011-07-14 DIAGNOSIS — I70269 Atherosclerosis of native arteries of extremities with gangrene, unspecified extremity: Secondary | ICD-10-CM

## 2011-07-14 DIAGNOSIS — E1165 Type 2 diabetes mellitus with hyperglycemia: Secondary | ICD-10-CM

## 2011-07-14 DIAGNOSIS — IMO0001 Reserved for inherently not codable concepts without codable children: Secondary | ICD-10-CM

## 2011-07-14 DIAGNOSIS — Z5189 Encounter for other specified aftercare: Secondary | ICD-10-CM

## 2011-07-14 DIAGNOSIS — S88119A Complete traumatic amputation at level between knee and ankle, unspecified lower leg, initial encounter: Secondary | ICD-10-CM

## 2011-07-14 DIAGNOSIS — I10 Essential (primary) hypertension: Secondary | ICD-10-CM

## 2011-07-14 LAB — GLUCOSE, CAPILLARY
Glucose-Capillary: 144 mg/dL — ABNORMAL HIGH (ref 70–99)
Glucose-Capillary: 105 mg/dL — ABNORMAL HIGH (ref 70–99)
Glucose-Capillary: 102 mg/dL — ABNORMAL HIGH (ref 70–99)
Glucose-Capillary: 145 mg/dL — ABNORMAL HIGH (ref 70–99)

## 2011-07-14 LAB — CULTURE, BLOOD (ROUTINE X 2)
Culture: NO GROWTH
Culture: NO GROWTH

## 2011-07-14 NOTE — Progress Notes (Signed)
Physical Therapy Session Note  Patient Details  Name: [name] MRN: [id number] Date of Birth: [birth year]  Today's Date: 07/14/2011 Time: 1015-1100 Time Calculation (min): 45 min  Skilled Therapeutic Interventions/Progress Updates: Discussed d/c planning with pt and pt states she thinks her mom will help her and stay with her when she goes home, but has not yet asked her. Discussed her mom needing to be able to assist her with the w/c over the threshold in the garage (pt states it is not a step like the front porch) and pt reports this would not be an issue. Pt unrealistic about the front step stating she would be able to do it fine, but has not demonstrated so in therapy. Then pt verbalized feeling unsteady with her RW and would plan on using her w/c in the home, verbalized to pt that this is the goal and why we are recommending assistance for gait/standing due to new amputation; then pt verbalized understanding. Reviewed HEP for LE strengthening and ROM; pt able to recall exercises learned previously with minimal cueing completed 15 reps x 2 sets on hand out for hip extension, flexion, knee flexion/extension, and abduction as well as 10 reps of mini curls. Practiced transfers and set up of w/c without RW due to mostly w/c mobility in the home; initially requiring total cueing for technique but progressed to supervision with multiple repetitions. Pt does not appear to have good insight into all that is entailed for her to be independent when she goes home and still demonstrates decreased safety with tasks.      Therapy Documentation Precautions:  Precautions Precautions: Fall Precaution Comments: NWB R LE Required Braces or Orthoses: Knee Immobilizer - Right Restrictions Weight Bearing Restrictions: Yes RLE Weight Bearing: Non weight bearing Other Position/Activity Restrictions: Right LE    Pain:  No complaints of pain.  See FIM for current functional status  Therapy/Group:  Individual Therapy  Karolee Stamps [facility] 07/14/2011, 11:15 AM

## 2011-07-14 NOTE — Progress Notes (Signed)
Physical Therapy Session Note  Patient Details  Name: [name] MRN: [id number] Date of Birth: Apr 21, 1965  Today's Date: 07/14/2011 Time: 4098-1191 Time Calculation (min): 60 min  Short Term Goals: Week 1= STG  Skilled Therapeutic Interventions/Progress Updates:   Pt exercised on UE ergometer x 8' (forwards for 4'; backwards for 4') on random program.  Pt self-propelled w/c x>150' throughout session with cueing for parts management and safety.  Focused on bed mobility and problem-solving in a home environment with pt requiring cueing and assistance to problem solve safest mobility.  W/c<>bed transfers with supervision and mod cueing for w/c management and squat-pivot transfer.  Pt reports that she spoke to her mom and plans to d/c home with them on Friday.  Pt reports "I have not decided how long I will let them stay" despite education and recommendation that pt requires assistance and supervision for safety due to increased falls risk.  Therapist educated pt that family education will need to take place prior to d/c, pt believes that they do not need to be here "they know how to take care of me".  Performed navigation over obstacles in home environment to simulate garage-entry as well as navigating around obstacles in hallway with gait in RW and min A.  Pt became easy frustrated with obstacle navigation stating "this won't be an issue" or "I hate this".  Discussed how this practice will carry over into a home environment (children's toys on the floor, furniture, etc) but pt does not see it.  Pt was unable to problem-solve how to negotiate around an obstacle when she felt too uncomfortable to do it safely.  Educated on multiple options, including using w/c instead of RW and asking for assistance but pt reports none of this will be an issue when she leaves.  Therapy Documentation Precautions:  Precautions Precautions: Fall Precaution Comments: NWB R LE Required Braces or Orthoses: Knee  Immobilizer - Right Restrictions Weight Bearing Restrictions: Yes RLE Weight Bearing: Non weight bearing Other Position/Activity Restrictions: Right LE  Pain: Pt reports no pain- pt premedicated.  Locomotion : Ambulation Ambulation/Gait Assistance: 4: Min assist   See FIM for current functional status  Therapy/Group: Individual Therapy  [name] 07/14/2011, 3:08 PM

## 2011-07-14 NOTE — Progress Notes (Signed)
Social Work Patient ID: [name], female   DOB: [birth year], 46 y.o.   MRN: [id number] Met with pt to discuss care at home.  She reports she spoke with her Mom last night and she will assist her at home.  Pt reports MD is ok with her going home on Friday 7/12.  Discussed DME and Follow up, wants hospital bed, wheelchair, bsc and another walker She wants two, aware of private pay.  She feels she is getting her plans together for her discharge.  She feels calmer regarding going home. Spoke with [facility] to have a Child psychotherapist go out to assist with community resources-SSI and Medicaid applications along with monitoring The home situation.  Pt is aware of this.

## 2011-07-14 NOTE — Progress Notes (Signed)
Patient refused senna times 2 days, explain to the patient the importants of medication patient refused teaching and stated that she was fine and she had a bowel movement already.

## 2011-07-14 NOTE — Progress Notes (Signed)
Subjective/Complaints: Had a good day. Did well with car transfers. Tells me she has help lined up at home including her mother who will help with the kids .Marland KitchenA 12 point review of systems has been performed and if not noted above is otherwise negative.   Objective: Vital Signs: Blood pressure 147/70, pulse 87, temperature 98.6 F (37 C), temperature source Oral, resp. rate 18, weight 95.1 kg (209 lb 10.5 oz), last menstrual period 06/17/2011, SpO2 96.00%. No results found.  Basename 07/12/11 0545  WBC 6.3  HGB 9.1*  HCT 28.8*  PLT 442*    Basename 07/12/11 0545  NA 140  K 3.5  CL 104  CO2 27  GLUCOSE 136*  BUN 9  CREATININE 1.04  CALCIUM 9.0   CBG (last 3)   Basename 07/14/11 0710 07/13/11 2035 07/13/11 1617  GLUCAP 144* 133* 119*    Wt Readings from Last 3 Encounters:  07/12/11 95.1 kg (209 lb 10.5 oz)  07/09/11 100 kg (220 lb 7.4 oz)  07/09/11 100 kg (220 lb 7.4 oz)    Physical Exam:  .. General: Alert and oriented x 3 HEENT: Head is normocephalic, atraumatic, PERRLA, EOMI, sclera anicteric, oral mucosa pink and moist, dentition intact, ext ear canals clear,  Neck: Supple without JVD or lymphadenopathy Heart: Reg rate and rhythm. No murmurs rubs or gallops Chest: CTA bilaterally without wheezes, rales, or rhonchi; no distress Abdomen: Soft, non-tender, non-distended, bowel sounds positive. Extremities: No clubbing, cyanosis, or edema. Pulses are 2+ Skin: Clean and intact wound without drainage. Callous/wound  on the left MT heads 1-2 Neuro: Pt is cognitively appropriate with normal insight, memory, and awareness. Cranial nerves 2-12 are intact. Sensory exam is notable for mild stocking glove sensory loss. distally. Reflexes are 2+ in all 4's. Fine motor coordination is intact. No tremors. Motor function is grossly 5/5 except at affected limb.  Musculoskeletal: Full ROM, No pain with AROM or PROM in the neck, trunk, or extremities. Posture appropriate. Right BKA is  clean and well shaped. Minimally tender. Psych: Pt's affect is appropriate. Pt is cooperative    Assessment/Plan: 1. Functional deficits secondary to right BKA which require 3+ hours per day of interdisciplinary therapy in a comprehensive inpatient rehab setting. Physiatrist is providing close team supervision and 24 hour management of active medical problems listed below. Physiatrist and rehab team continue to assess barriers to discharge/monitor patient progress toward functional and medical goals.  Pt seemed to have reasonable insight and awareness as it pertains to her home situation, safety, etc.  FIM: FIM - Bathing Bathing Steps Patient Completed: Chest;Right Arm;Left Arm;Abdomen;Front perineal area;Buttocks;Right upper leg;Left upper leg;Left lower leg (including foot) Bathing: 5: Supervision: Safety issues/verbal cues  FIM - Upper Body Dressing/Undressing Upper body dressing/undressing steps patient completed: Thread/unthread right sleeve of pullover shirt/dresss;Thread/unthread left sleeve of pullover shirt/dress;Put head through opening of pull over shirt/dress;Pull shirt over trunk Upper body dressing/undressing: 7: Complete Independence: No helper FIM - Lower Body Dressing/Undressing Lower body dressing/undressing steps patient completed: Thread/unthread right underwear leg;Thread/unthread left underwear leg;Pull underwear up/down;Thread/unthread right pants leg;Thread/unthread left pants leg;Pull pants up/down;Don/Doff left sock;Don/Doff left shoe;Fasten/unfasten left shoe Lower body dressing/undressing: 5: Supervision: Safety issues/verbal cues  FIM - Toileting Toileting steps completed by patient: Adjust clothing prior to toileting;Performs perineal hygiene;Adjust clothing after toileting Toileting Assistive Devices: Grab bar or rail for support Toileting: 0: Activity did not occur  FIM - Diplomatic Services operational officer Devices: Bedside commode;Grab  bars;Walker Toilet Transfers: 0-Activity did not occur  FIM -  Bed/Chair Transport planner Devices: Environmental consultant;Arm rests Bed/Chair Transfer: 5: Bed > Chair or W/C: Supervision (verbal cues/safety issues);5: Chair or W/C > Bed: Supervision (verbal cues/safety issues)  FIM - Locomotion: Wheelchair Locomotion: Wheelchair: 5: Travels 150 ft or more: maneuvers on rugs and over door sills with supervision, cueing or coaxing FIM - Locomotion: Ambulation Locomotion: Ambulation Assistive Devices: Designer, industrial/product Ambulation/Gait Assistance: 4: Min guard Locomotion: Ambulation: 1: Travels less than 50 ft with minimal assistance (Pt.>75%)  Comprehension Comprehension Mode: Auditory Comprehension: 5-Understands complex 90% of the time/Cues < 10% of the time  Expression Expression Mode: Verbal Expression: 5-Expresses complex 90% of the time/cues < 10% of the time  Social Interaction Social Interaction: 4-Interacts appropriately 75 - 89% of the time - Needs redirection for appropriate language or to initiate interaction.  Problem Solving Problem Solving: 5-Solves basic 90% of the time/requires cueing < 10% of the time  Memory Memory: 4-Recognizes or recalls 75 - 89% of the time/requires cueing 10 - 24% of the time  1. DVT Prophylaxis/Anticoagulation: Pharmaceutical: Lovenox  2. Pain Management: prn oxycodone effective.  3. Mood: positive reinforcement by team. 4. Neuropsych: This patient is capable of making decisions on his/her own behalf.  5. Left foot ulcer: healed. Pressure relief measures to prevent reopening/breakdown. The patient has custom insoles and shoes through [employer]. She should wear these with mobility attempts.  6. DM type 2: monitor with AC/HS cbg checks. Continue lantus- fair control thus far.  7. Fevers: resolving. IV antibiotics changed to po doxycycline. Afebrile.--continue 2 weeks total?   Monitor wounds for drainage.  8. HTN: ACE discontinued due to  renal insufficiency. Continue norvasc. Continue to monitor with bid checks.  9. Anemia: Continue iron supplement. hgb stable  LOS (Days) 5 A FACE TO FACE EVALUATION WAS PERFORMED  [name] T 07/14/2011, 7:23 AM

## 2011-07-14 NOTE — Progress Notes (Signed)
Social Work Patient ID: [name], female   DOB: 1965/09/28, 46 y.o.   MRN: [id number] Pt requires a lightweight wheelchair to self propel and assist with her self care tasks.  She is unable to self propel in a standard wheelchair. She also requires a hospital bed to elevate her amputated leg and provide her with pain relief.  She is not able to elevate enough with a wedge or  Pillows.

## 2011-07-14 NOTE — Progress Notes (Signed)
Reviewed and in agreement with treatment provided.  

## 2011-07-14 NOTE — Progress Notes (Signed)
Occupational Therapy Session Notes  Patient Details  Name: HELIA HAESE MRN: [id number] Date of Birth: Oct 26, 1965  Today's Date: 07/14/2011  Short Term Goals: Week 1:  OT Short Term Goal 1 (Week 1): 1.  Pt. will be mod I with bathing OT Short Term Goal 2 (Week 1): Pt. will be mod I with dressing OT Short Term Goal 3 (Week 1): Pt. will be mod I with toileting OT Short Term Goal 4 (Week 1): Pt. will be mod I with toilet transfer  Skilled Therapeutic Interventions/Progress Updates:   Session #1 0915-1000 - 45 Minutes Individual Therapy No complaints of pain Patient found seated edge of bed with urgency to use restroom. Ambulated from edge of bed -> BSC over toilet seat with min assist using rolling walker. Patient then performed ADL retraining at bed level per her request. Patient again requested curtain be pulled during bathing & dressing for privacy but willing to have therapist for supervision while standing during ADL. Patient able to recall recommendation of sitting to button pants without any cues. Discussed with patient importance of donning shoe during any and all standing/ambulation activities. Therapist performed compression wrapping to right residual limb and left patient seated in w/c with right residual limb straight on pad and call bell & phone within reach.   Session #2 [id number] - 40 Minutes Individual Therapy No complaints of pain Upon entering room patient seated in w/c eating lunch and refused to engage in therapy until she was finished eating. Therapist discussed importance of looking at schedule and scheduling around therapy session for optimal therapeutic sessions. Patient also stated that she was going home on Friday and would not wait until Monday to d/c; discussed this with SW. Patient then propelled self -> ADL apartment for activity in ADL kitchen.  Discussed overall safety in kitchen, introduced, educated, demonstrated, and patient return demonstrated using a  reacher for tasks. Gave patient a few recommendations regarding placement of items in kitchen to increase overall safety. After kitchen task engaged in furniture transfer on/off couch. Patient required close supervision during transfer on/off and min verbal cues. Patient uncoordinated during transfer and had difficult time with problem solving. Left patient in gym for next therapy session.   Precautions:  Precautions Precautions: Fall Precaution Comments: NWB R LE Required Braces or Orthoses: Knee Immobilizer - Right Restrictions Weight Bearing Restrictions: Yes RLE Weight Bearing: Non weight bearing Other Position/Activity Restrictions: Right LE  See FIM for current functional status  Ladeja Pelham 07/14/2011, 10:27 AM

## 2011-07-14 NOTE — Patient Care Conference (Signed)
Inpatient RehabilitationTeam Conference Note Date: 07/13/2011   Time: 2:10 PM    Patient Name: [name]      Medical Record Number: [id number]  Date of Birth: 09/29/1965 Sex: Female         Room/Bed: 4010/4010-01 Payor Info: Payor: Investment banker, operational HEALTHCARE  Plan: Advertising copywriter  Product Type: *No Product type*     Admitting Diagnosis: R BKA  Admit Date/Time:  07/09/2011  4:19 PM Admission Comments: No comment available   Primary Diagnosis:  Unilateral complete BKA Principal Problem: Unilateral complete BKA  Patient Active Problem List   Diagnosis Date Noted  . Unilateral complete BKA 07/09/2011  . Gas gangrene 07/02/2011  . Sepsis 07/02/2011  . Acute renal failure 07/02/2011  . Anemia 11/26/2010  . Diabetes mellitus 11/22/2010  . Diabetic foot ulcer associated with type 2 diabetes mellitus   . Hypertension   . Arthritis   . Neuropathy   . Intracerebral hemorrhage     Expected Discharge Date: Expected Discharge Date: 07/19/11  Team Members Present: Physician: Dr. Faith Rogue Case Manager Present: Lutricia Horsfall, RN Social Worker Present: Dossie Der, LCSW Nurse Present: Daryll Brod, RN PT Present: Karolee Stamps, Mammie Lorenzo, PT OT Present: Edwin Cap, OT SLP Present: Feliberto Gottron, SLP     Current Status/Progress Goal Weekly Team Focus  Medical   right BKA, wound looks good, callous on left MT, dm  wound care magangemt, skin care,   optimize DM control   Bowel/Bladder   Continent of bowel and bladder, last bowel movement 07/11/11 after Sorbitol. Pt refusing bowel aid at times.  Pt to remain continent of bowel and bladder  Monitor to ensure bowel movement every other day.   Swallow/Nutrition/ Hydration             ADL's   overall supervision -> steady assist  overall mod I  ADL retraining, functional ambulation, w/c management, compression wrapping, overall activity tolerance/endurance, UE strengthening   Mobility   min A overall  mod I w/c  level, supervision short distance gait, min A curb step with RW  activity tolerance, gait training, dynamic standing balance, functional strengthening   Communication             Safety/Cognition/ Behavioral Observations            Pain   Oxy IR 10-15mg  q 3hrs, prn  <5  Given pain medicaiton 1 hr prior to initial therapy session   Skin   R AKA with staples, and moderate amount of serosangious drainage from mid-incision. L foot callous with pressure ulcer noted to great toe  No additional skin breakdown  Daily dressing change. inspection incision for s/s of infection. check pressure ulcer to ensure area is clean, dry, intact      *See Interdisciplinary Assessment and Plan and progress notes for long and short-term goals  Barriers to Discharge: skin care needs, realistic expectations    Possible Resolutions to Barriers:  education, assistance from family    Discharge Planning/Teaching Needs:  Home, cousin to bring children back from [place], hopefully will stay with a few days, amke sure settled. To hire assistance with home management      Team Discussion:  Discussion of pt's dx, hx, wounds. Needs shoe on when up. At supervision level. Needs to be mod I. Concern for pt's d/c plan: pt lives with 2 young children and no other identified source of assistance. Will continue to work with pt on plans. Will have HH SW f/u.  Revisions to  Treatment Plan:  none   Continued Need for Acute Rehabilitation Level of Care: The patient requires daily medical management by a physician with specialized training in physical medicine and rehabilitation for the following conditions: Daily direction of a multidisciplinary physical rehabilitation program to ensure safe treatment while eliciting the highest outcome that is of practical value to the patient.: Yes Daily medical management of patient stability for increased activity during participation in an intensive rehabilitation regime.: Yes Daily analysis of  laboratory values and/or radiology reports with any subsequent need for medication adjustment of medical intervention for : Post surgical problems;Other  Meryl Dare 07/14/2011, 2:23 PM

## 2011-07-14 NOTE — Care Management Note (Signed)
Met with pt after team conference yesterday. Pt in agreement with mod I goals and understands that she will not be able to amb stairs or drive. Pt requesting to go home sooner than planned d/c date of 7/15. Discussed barriers at home and possible difficulties in caring for her 2 children. Asked pt for permission to call a family member to assist in discharge planning for pt and her children. Pt denied, saying"I can take care of everything myself". She reports that she plans to hire help.

## 2011-07-15 LAB — GLUCOSE, CAPILLARY
Glucose-Capillary: 135 mg/dL — ABNORMAL HIGH (ref 70–99)
Glucose-Capillary: 100 mg/dL — ABNORMAL HIGH (ref 70–99)
Glucose-Capillary: 128 mg/dL — ABNORMAL HIGH (ref 70–99)
Glucose-Capillary: 117 mg/dL — ABNORMAL HIGH (ref 70–99)

## 2011-07-15 NOTE — Plan of Care (Signed)
Problem: RH Balance Goal: LTG Patient will maintain dynamic standing balance (PT) LTG: Patient will maintain dynamic standing balance with assistance during mobility activities (PT)  Outcome: Not Met (add Reason) Requires supervision/stand by assist.  Problem: RH Bed to Chair Transfers Goal: LTG Patient will perform bed/chair transfers w/assist (PT) LTG: Patient will perform bed/chair transfers with assistance, with/without cues (PT).  Outcome: Not Met (add Reason) Requires supervision for all transfers.  Problem: RH Wheelchair Mobility Goal: LTG Patient will propel w/c in home environment (PT) LTG: Patient will propel wheelchair in home environment, # of feet with assistance (PT).  Outcome: Not Met (add Reason) Required supervision/cueing in home environment for safety.  Problem: RH Stairs Goal: LTG Patient will ambulate up and down stairs w/assist (PT) LTG: Patient will ambulate up and down # of stairs with assistance (PT)  Outcome: Not Met (add Reason) Pt refused to attempt curb step.  Required min A for 2" platform step.

## 2011-07-15 NOTE — Progress Notes (Signed)
Occupational Therapy Session Notes & Discharge Summary  Patient Details  Name: [name] MRN: [id number] Date of Birth: [birth year]  Today's Date: 07/15/2011  SESSION NOTES  Session #1 [id number] - 45 Minutes Individual Therapy Patient with 6/10 complaints of pain; RN aware Patient found seated edge of bed. Engaged in ADL retraining at bed level at an overall modified independent level for basic self-care tasks of UB/LB bathing & dressing and grooming tasks. Patient requires supervision for transfers at this time secondary to patient requires min verbal cues for w/c management. Patient left seated in w/c with call bell & phone within reach.   Session #2 [id number] - 40 Minutes Individual Therapy No complaints of pain Patient propelled self -> ADL apartment for bed mobility and education, demonstration, and return demonstration from patient on drop arm BSC transfers from both sides of the bed. Patient requires supervision & set-up for [facility] transfer at this time secondary to decreased safety awareness, decreased problem solving, and verbal cues needed for w/c management. Patient also completed tub/shower transfer on/off tub transfer bench with close supervision for safety and min verbal cues. Also education, demonstrated, and had patient return demonstrate compression wrapping to right residual limb; patient requires min assistance to complete wrapping correctly. Patient propelled self back to room.    DISCHARGE SUMMARY Patient has met 7 of 9 long term goals due to improved activity tolerance, improved balance, postural control, ability to compensate for deficits and improved coordination.  Patient to discharge at overall supervision -> mod I level.  No caregiver has been available for education. Therapist recommends patient stay on CIR a few more days to reach an overall modified independent level for all aspects of basic self-care tasks and ADL transfers, however patient very persistent on  going home tomorrow (Friday, 07/16/11).   Reasons goals not met: Patient did not meet toilet transfer goal of mod I, currently patient requires suprevision for safety during transfer and min verbal cues for w/c management (cues for breaks, leg rests, and arm rests). Patient also did not meet IADL goal of mod I for simple meal prep. Currently patient requires supervision for simple meal preps and IADL tasks in the kitchen secondary to poor safety awareness and cues needed for w/c management.   Recommendation:  Patient will benefit from ongoing skilled OT services in home health setting to continue to advance functional skills in the area of BADL, iADL and Reduce care partner burden.  Equipment: drop arm BSC  Reasons for discharge: discharge from hospital  Patient/family agrees with progress made and goals achieved: Yes  Precautions/Restrictions  Precautions Precautions: Fall Required Braces or Orthoses: Knee Immobilizer - Right Restrictions Weight Bearing Restrictions: Yes RLE Weight Bearing: Non weight bearing  Pain Pain Assessment Pain Assessment: 0-10 Pain Score:   6 Pain Type: Surgical pain;Phantom pain Pain Location: Incision Pain Orientation: Right RN made aware  ADL - See FIM  Vision/Perception  Vision - History Baseline Vision: Wears glasses for distance only Patient Visual Report: No change from baseline Vision - Assessment Eye Alignment: Within Functional Limits Perception Perception: Within Functional Limits Praxis Praxis: Intact   Cognition Overall Cognitive Status: Appears within functional limits for tasks assessed Arousal/Alertness: Awake/alert Orientation Level: Oriented X4 Memory: Appears intact Awareness: Appears intact Problem Solving: Impaired Problem Solving Impairment: Functional complex Behaviors: Restless Safety/Judgment: Impaired Comments: Patient insistent on d/cing home friday 7/12  Sensation Sensation Light Touch: Appears  Intact Additional Comments: BUEs appear intact Coordination Gross Motor Movements are Fluid and Coordinated: Yes  Fine Motor Movements are Fluid and Coordinated: Yes  Motor - See Discharge Summary  Mobility - See Discharge Summary  Trunk/Postural Assessment - See Discharge Summary  Balance- See Discharge Summary   Extremity/Trunk Assessment RUE Assessment RUE Assessment: Within Functional Limits LUE Assessment LUE Assessment: Within Functional Limits  See FIM for current functional status  Ibraheem Voris 07/15/2011, 8:49 AM

## 2011-07-15 NOTE — Progress Notes (Signed)
Attempted twice today to assess and change stump dressing with pt refusing. Pt also has unsafe behaviors. Pt went to bathroom without calling for help. Bedalarm on pt with door open. Cont. To monitor pt.

## 2011-07-15 NOTE — Progress Notes (Signed)
Social Work Patient ID: [name], female   DOB: 12/02/1965, 46 y.o.   MRN: [id number] Met with pt who reports everything set for Friday. MD has okayed the discharge and team on board.  Pt will have her parents transport Her home, she also reports her Mom will be staying to help with her and the children for a short time.  Has list for hired assist if becomes necessary At home.  DME to be delivered to room and home, arranged with pt.  Pt pleased to be going home tomorrow even though all of her goals will not be  Reached.  Her main concern is paying her household bills.  Pt feels she is ready to go home and can take care of herself along with her children. Pt states: " I have learned things everyday and will manage at home."  [facility] to provide follow up at home and monitor the home situation.

## 2011-07-15 NOTE — Progress Notes (Signed)
Recreational Therapy Discharge Summary Patient Details  Name: [name] MRN: [id number] Date of Birth: 12/08/1965 Today's Date: 07/15/2011  Informed by OT, pt states she is discharging home tomorrow although team recommends further rehab stay. No formal community reintegration goal written or addressed during ELOS.     Kartel Wolbert 07/15/2011, 9:20 AM

## 2011-07-15 NOTE — Progress Notes (Signed)
Patient ID: [name], female   DOB: Apr 06, 1965, 46 y.o.   MRN: [id number] Subjective/Complaints: No new complaints. Still wants to go home Friday. Marland Kitchen.A 12 point review of systems has been performed and if not noted above is otherwise negative.   Objective: Vital Signs: Blood pressure 161/84, pulse 101, temperature 98.7 F (37.1 C), temperature source Oral, resp. rate 18, weight 94.3 kg (207 lb 14.3 oz), last menstrual period 06/17/2011, SpO2 100.00%. No results found. No results found for this basename: WBC:2,HGB:2,HCT:2,PLT:2 in the last 72 hours No results found for this basename: NA:2,K:2,CL:2,CO2:2,GLUCOSE:2,BUN:2,CREATININE:2,CALCIUM:2 in the last 72 hours CBG (last 3)   Basename 07/15/11 0717 07/14/11 2017 07/14/11 1627  GLUCAP 135* 145* 102*    Wt Readings from Last 3 Encounters:  07/15/11 94.3 kg (207 lb 14.3 oz)  07/09/11 100 kg (220 lb 7.4 oz)  07/09/11 100 kg (220 lb 7.4 oz)    Physical Exam:  .. General: Alert and oriented x 3 HEENT: Head is normocephalic, atraumatic, PERRLA, EOMI, sclera anicteric, oral mucosa pink and moist, dentition intact, ext ear canals clear,  Neck: Supple without JVD or lymphadenopathy Heart: Reg rate and rhythm. No murmurs rubs or gallops Chest: CTA bilaterally without wheezes, rales, or rhonchi; no distress Abdomen: Soft, non-tender, non-distended, bowel sounds positive. Extremities: No clubbing, cyanosis, or edema. Pulses are 2+ Skin: Clean and intact wound without drainage. Callous/wound  on the left MT heads 1-2 Neuro: Pt is cognitively appropriate with normal insight, memory, and awareness. Cranial nerves 2-12 are intact. Sensory exam is notable for mild stocking glove sensory loss. distally. Reflexes are 2+ in all 4's. Fine motor coordination is intact. No tremors. Motor function is grossly 5/5 except at affected limb.  Musculoskeletal: Full ROM, No pain with AROM or PROM in the neck, trunk, or extremities. Posture appropriate.  Right BKA is clean and well shaped. Minimally tender. Psych: Pt's affect is appropriate. Pt is cooperative    Assessment/Plan: 1. Functional deficits secondary to right BKA which require 3+ hours per day of interdisciplinary therapy in a comprehensive inpatient rehab setting. Physiatrist is providing close team supervision and 24 hour management of active medical problems listed below. Physiatrist and rehab team continue to assess barriers to discharge/monitor patient progress toward functional and medical goals.  I discussed with patient regarding safety, realistic expectations, and adherence to the recs of her medical providers. the team feels that she needs through the weekend to be better prepared to go home. She insists on going home Friday. i will not make her sign out AMA, but we all need to document our recommendations that she should stay. We will prepare her the best we can with the time available.Marland Kitchen  FIM: FIM - Bathing Bathing Steps Patient Completed: Chest;Right Arm;Left Arm;Abdomen;Front perineal area;Buttocks;Right upper leg;Left upper leg;Left lower leg (including foot) Bathing: 6: Assistive device (Comment)  FIM - Upper Body Dressing/Undressing Upper body dressing/undressing steps patient completed: Thread/unthread right sleeve of pullover shirt/dresss;Thread/unthread left sleeve of pullover shirt/dress;Put head through opening of pull over shirt/dress;Pull shirt over trunk Upper body dressing/undressing: 7: Complete Independence: No helper FIM - Lower Body Dressing/Undressing Lower body dressing/undressing steps patient completed: Thread/unthread right underwear leg;Thread/unthread left underwear leg;Pull underwear up/down;Thread/unthread right pants leg;Thread/unthread left pants leg;Pull pants up/down;Don/Doff left sock;Don/Doff left shoe;Fasten/unfasten left shoe Lower body dressing/undressing: 5: Supervision: Safety issues/verbal cues  FIM - Toileting Toileting steps  completed by patient: Adjust clothing prior to toileting;Performs perineal hygiene;Adjust clothing after toileting Toileting Assistive Devices: Grab bar or rail for support  Toileting: 5: Supervision: Safety issues/verbal cues  FIM - Diplomatic Services operational officer Devices: Grab bars Toilet Transfers: 5-To toilet/BSC: Supervision (verbal cues/safety issues);5-From toilet/BSC: Supervision (verbal cues/safety issues)  FIM - Press photographer Assistive Devices: Arm rests Bed/Chair Transfer: 6: Sit > Supine: No assist;6: Supine > Sit: No assist;5: Bed > Chair or W/C: Supervision (verbal cues/safety issues);5: Chair or W/C > Bed: Supervision (verbal cues/safety issues)  FIM - Locomotion: Wheelchair Locomotion: Wheelchair: 5: Travels 150 ft or more: maneuvers on rugs and over door sills with supervision, cueing or coaxing FIM - Locomotion: Ambulation Locomotion: Ambulation Assistive Devices: Designer, industrial/product Ambulation/Gait Assistance: 4: Min assist Locomotion: Ambulation: 1: Travels less than 50 ft with minimal assistance (Pt.>75%)  Comprehension Comprehension Mode: Auditory Comprehension: 5-Understands complex 90% of the time/Cues < 10% of the time  Expression Expression Mode: Verbal Expression: 5-Expresses complex 90% of the time/cues < 10% of the time  Social Interaction Social Interaction: 5-Interacts appropriately 90% of the time - Needs monitoring or encouragement for participation or interaction.  Problem Solving Problem Solving: 5-Solves complex 90% of the time/cues < 10% of the time  Memory Memory: 5-Recognizes or recalls 90% of the time/requires cueing < 10% of the time  1. DVT Prophylaxis/Anticoagulation: Pharmaceutical: Lovenox  2. Pain Management: prn oxycodone effective.  3. Mood: positive reinforcement by team. 4. Neuropsych: This patient is capable of making decisions on his/her own behalf.  5. Left foot ulcer: healed. Pressure relief  measures to prevent reopening/breakdown. The patient has custom insoles and shoes through [employer]. She should wear these with mobility attempts.  6. DM type 2: monitor with AC/HS cbg checks. Continue lantus- fair control thus far.  7. Fevers: resolving. IV antibiotics changed to po doxycycline. Afebrile.--continue 2 weeks total?   Monitor wounds for drainage.  8. HTN: ACE discontinued due to renal insufficiency. Continue norvasc. Continue to monitor with bid checks.  9. Anemia: Continue iron supplement. hgb stable  LOS (Days) 6 A FACE TO FACE EVALUATION WAS PERFORMED  Zhanna Melin T 07/15/2011, 9:30 AM

## 2011-07-15 NOTE — Progress Notes (Signed)
Physical Therapy Discharge Summary  Patient Details  Name: [name] MRN: [id number] Date of Birth: 06-05-1965  Today's Date: 07/15/2011  Patient has met 6 of 10 long term goals due to improved activity tolerance, improved balance, increased strength, decreased pain and improved awareness.  Patient to discharge at a wheelchair level with use of RW for short distance gait with Supervision.   Patient insisted on d/c 07/16/11 to home despite therapists recommendations for elongated LOS for safety as pt presents with inconsistent safety awareness, problem-solving abilities, and independence level with safe mobility and is at high risk for falls.  Pt's assistance at home is limited as she is the primary caregiver for 2 children (ages 26 and 60).  Therapists strongly recommended that pt has supervision at home and that her family participates in family education to prepare for d/c.  Pt was unwilling to have her family participate in family education- reporting they already knew how to take care of her despite therapist's encouragement.  Pt is to discharge to home with children and parents with parents providing supervision/assistance needed.   Reasons goals not met: Due to decreased safety awareness and attention, pt was inconsistent in the amount of assistance required for functional tasks.  Pt continued to most often require cueing and supervision/SBA for dynamic standing balance, bed<>chair transfers, and mobility in home environment.   Pt did not meet stair negotiation goal as she refused to attempt 1 curb step to simulate home-entry and required min A for negotiation of 2" platform step with RW.   Recommendation:  Patient will benefit from ongoing skilled PT services in home health setting to continue to advance safe functional mobility, address ongoing impairments in safety awareness, attention, balance, w/c mobility, gait, muscle weakness, pre-prosthetic training, stair negotiation, endurance,  activity tolerance, and minimize fall risk.   Equipment: 18x18 basic w/c with R amputee pad; RW; Hospital bed  Reasons for discharge: discharge from hospital upon pt request  Patient agrees with progress made and goals achieved: Yes; however pt continues to express unrealistic sense of progress, believing that she is more safe and independent than therapists recommend and objective measures show.   PT Discharge Precautions/Restrictions Precautions Precautions: Fall Precaution Comments: R BKA Required Braces or Orthoses: Knee Immobilizer - Right (custom shoe L) Restrictions Weight Bearing Restrictions: Yes RLE Weight Bearing: Non weight bearing Other Position/Activity Restrictions: Right LE Pain Pt frequently reports pain as well as severe itching of R residual limb (often rating 5-7 /10) Vision/Perception  Perception Perception: Within Functional Limits Praxis Praxis: Intact  Cognition Overall Cognitive Status: Appears within functional limits for tasks assessed Arousal/Alertness: Awake/alert Orientation Level: Oriented X4 Memory: Appears intact Awareness: Impaired Problem Solving: Impaired Problem Solving Impairment: Functional complex Behaviors: Restless;Impulsive Safety/Judgment: Impaired Comments: Patient insistent on d/cing home friday 7/12 despite therapists recommendations Sensation Sensation Light Touch: Impaired by gross assessment (decreased throughout RLE; decreased bottom of L foot) Proprioception: Appears Intact Coordination Gross Motor Movements are Fluid and Coordinated: Yes Fine Motor Movements are Fluid and Coordinated: Yes Motor  Motor Motor: Within Functional Limits (R BKA)  Locomotion  Ambulation Ambulation/Gait Assistance: 5: Supervision Gait Gait: Yes Gait Pattern: Impaired Gait Pattern: Step-to pattern;Decreased step length - left;Decreased hip/knee flexion - left;Lateral hip instability;Trunk flexed;Decreased trunk rotation;Narrow base of  support Gait velocity: .38 ft/sec Stairs / Additional Locomotion Stairs: No Curb: Not tested (comment) (pt refused to attempt backwards with RW) Naval architect Mobility: Yes Wheelchair Assistance: 6: Modified independent (Device/Increase time) Occupational hygienist: Both upper extermities  Wheelchair Parts Management: Supervision/cueing Distance: >150'  Trunk/Postural Assessment  Cervical Assessment Cervical Assessment: Within Functional Limits Thoracic Assessment Thoracic Assessment: Within Functional Limits Lumbar Assessment Lumbar Assessment: Within Functional Limits Postural Control Postural Control: Within Functional Limits  Balance Balance Balance Assessed: Yes Static Sitting Balance Static Sitting - Balance Support: Feet supported;No upper extremity supported Static Sitting - Level of Assistance: 7: Independent Dynamic Sitting Balance Dynamic Sitting - Balance Support: Feet supported Dynamic Sitting - Level of Assistance: 7: Independent Dynamic Sitting - Balance Activities: Lateral lean/weight shifting;Forward lean/weight shifting;Reaching for objects Static Standing Balance Static Standing - Balance Support: Right upper extremity supported;Left upper extremity supported Static Standing - Level of Assistance: 6: Modified independent (Device/Increase time) Dynamic Standing Balance Dynamic Standing - Level of Assistance: 5: Stand by assistance Extremity Assessment  RUE Assessment RUE Assessment: Within Functional Limits LUE Assessment LUE Assessment: Within Functional Limits RLE Assessment RLE Assessment: Exceptions to WFL (R BKA; ROM WFL; MMT 4+/5 hip; 4/5 knee) LLE Assessment LLE Assessment: Within Functional Limits (Grossly 4+ to 5/5)   Skilled PT Session #1: Time: 9:30-10:30 (60 min)  Individual therapy session.  No c/o pain throughout session however pt reports itching of R residual limb.  Tx focused on w/c mobility, problem-solving and  functional activities within hospital room.  Pt demonstrated appropriate safety, balance, and w/c management with Mod I during w/c mobility, bed<>w/c transfers, sit to stand transfers with RW and self care.  Gait with RW into bathroom with supervision for toilet transfers.  Pt required increased time, but was able to problem-solve appropriately and safely in familiar environment.  Pt self-propelled w/c to gym for improved mobility, w/c management, endurance, and UE strength.  Pt demonstrated Mod I with bed mobility and gait x 60' in RW with supervision to fatigue.  Gait velocity = .38 ft/sec.  With pt in w/c, therapist demonstrated how family will need to bump-up w/c over 2" platform step for garage entry to home.  Pt reports understanding, but fear with mobility over threshold in that manner.  Pt performed step up/down 2" platform step x 2 repetitions with RW and min A as pt's other option for home-entry.  Therapist and pt thoroughly discussed how these options (family bump up in w/c or using RW with min A through garage entry) are the only safe methods for pt to enter/leave home at this time.  Detailed written instructions provided to pt explaining these 2 options for her and her family's reference. Educated pt on what to do in an emergency, loss of sensation, home modifications, and home safety throughout session.  Skilled PT Session #2: Time: 14:45-15:30 (45 min)  Individual therapy session.  Pt reports 5/10 pain at beginning of session- RN notified and pain medication delivered to pt during therapy.  Pt required cueing and supervision for management of w/c parts and safety with bed<>w/c transfers.  Therapist oriented pt to personal w/c which had been delivered and adjusted for pt as needed.  Pt self-propelled w/c with mod I for improved endurance and UE strengthening.  Pt performed furniture transfers w/c<>couch x 2, requiring supervision and cueing for each transfer for safety purposes.  Upon education  for safe technique, pt is able to perform transfer with minimal cueing.  However, pt demonstrates decreased carry-over of learned information once in an unfamiliar setting.  Safety awareness if further decreased when distractions are present of dual-tasking is necessary.  Pt reports that she will be staying in her w/c and not transferring to other surfaces (kitchen  chair, couch, etc) throughout the day with the exception of her bed and refused to attempt w/c<>kitchen chair transfer.  Thoroughly discussed and provided pt with personal Falls Risk Assessment and Safe Mobility Plan, outlining pt's reasons for being at high risk of falling; safe mobility levels (recommendations for supervision for all transfers and gait); iinstructions for how to prevent falls at home with transfers, gaits, and home entry; as well as further therapy recommendations and home modifications.  Reviewed packet with pt in detail with pt verbalized understanding of all recommendations and reported no further questions.  Recommended that pt review this information with family and to have family ask therapists if they have any questions prior to d/c.  Pt agreed to do so.  Left pt in room in w/c after she reported no further concerns, issues, or questions.  See FIM for current functional status  [name] 07/15/2011, 4:40 PM

## 2011-07-16 DIAGNOSIS — S88119A Complete traumatic amputation at level between knee and ankle, unspecified lower leg, initial encounter: Secondary | ICD-10-CM

## 2011-07-16 DIAGNOSIS — I70269 Atherosclerosis of native arteries of extremities with gangrene, unspecified extremity: Secondary | ICD-10-CM

## 2011-07-16 DIAGNOSIS — Z5189 Encounter for other specified aftercare: Secondary | ICD-10-CM

## 2011-07-16 DIAGNOSIS — I10 Essential (primary) hypertension: Secondary | ICD-10-CM

## 2011-07-16 DIAGNOSIS — E1165 Type 2 diabetes mellitus with hyperglycemia: Secondary | ICD-10-CM

## 2011-07-16 DIAGNOSIS — IMO0001 Reserved for inherently not codable concepts without codable children: Secondary | ICD-10-CM

## 2011-07-16 LAB — GLUCOSE, CAPILLARY
Glucose-Capillary: 150 mg/dL — ABNORMAL HIGH (ref 70–99)
Glucose-Capillary: 165 mg/dL — ABNORMAL HIGH (ref 70–99)

## 2011-07-16 MED ORDER — INSULIN GLARGINE 100 UNIT/ML ~~LOC~~ SOLN: 5.0000 [IU] | Freq: Every day | SUBCUTANEOUS | Status: DC

## 2011-07-16 MED ORDER — OXYCODONE HCL 10 MG PO TABS: 10.0000 mg | ORAL_TABLET | ORAL | Status: AC | PRN

## 2011-07-16 NOTE — Discharge Summary (Signed)
[name], [name] NO.:  [id number]  MEDICAL RECORD NO.:  [id number]  LOCATION:  4010                         FACILITY:  [facility]  PHYSICIAN:  Faith Rogue, M.D.      DATE OF BIRTH:  Jan 16, 1965  DATE OF ADMISSION:  07/09/2011 DATE OF DISCHARGE:  07/16/2011                              DISCHARGE SUMMARY   DISCHARGE DIAGNOSES: 1. Right below knee amputation. 2. Diabetes mellitus, type 2. 3. Left foot ulcer, healed. 4. Hypertension. 5. Fevers, resolved. 6. Anemia.  HISTORY OF PRESENT ILLNESS:  [name] is a 46 year old female with a history of diabetes mellitus and right foot ulcer with transmit and poorly healing wound.  She was admitted on June 28 with gangrenous changes, nausea, and was noted to be septic.  She was started on IV antibiotics and underwent right BKA with placement of VAC as well as I and D of left foot plantar diabetic ulcer on the same day.  On July 2, she was taken back to OR for I and D with right BKA revision and VAC removal.  She has been transfused with 2 units of packed red blood cells for anemia.  Renal insufficiency was treated with IV fluids.  She was evaluated by Therapy team and we felt that she would benefit from a CIR program.  PAST MEDICAL HISTORY: 1. Diabetes mellitus. 2. Hypertension. 3. Arthritis. 4. Neuropathy. 5. Diabetic foot ulcer. 6. History of intracerebral hemorrhage as a child. 7. Right foot amputation in November 2012.  FUNCTIONAL HISTORY:  The patient was independent prior to admission.  FUNCTIONAL STATUS:  The patient is requiring min assist for bed mobility, mod assist for transfers, mod assist for ambulating forefeet with cuing and assist for safe rolling walker use.  She requires setup assist for upper body bathing, mod assist for lower body bathing, min assist for upper body dressing, mod assist for lower body dressing.  LABORATORY DATA:  Recent labs, checkup lytes from July 8 revealed  sodium 140, potassium 3.5, chloride 104, CO2 of 27, BUN 9, creatinine 1.04, glucose 136.  CBC done revealed hemoglobin 9.1, hematocrit 28.8, white count 6.3, platelets 442.  UA of July 4 shows no growth.  HOSPITAL COURSE:  [name] was admitted to Rehab on July 09, 2011, for inpatient therapies to consist of PT and OT at least 3 hours 5 days a week.  Past admission, physiatrist, rehab, RN, and therapy team have worked together to provide customized collaborative interdisciplinary care.  The patient's blood pressures were checked on b.i.d. basis.  These were noted to be reasonably controlled and were ranging at 120s with occasional high in 140s range.  CBGs were checked on achs basis.  Blood sugars were ranging from 100 to 160 range.  p.o. intake has been good.  The patient advised to continue on Lantus 5 units at bedtime with meal coverage per her sliding scale at home.  She was afebrile during this stay.  As without evidence of leukocytosis and the wound noted to be healing well, doxycycline was discontinued on July 14, 2011.  The patient had been afebrile off of this.  Her right BKA site Is intact.  It showed  no signs or symptoms of infection. Moderate edema noted. Left plantar ulcer is clean and dry.  Callusing without any drainage.  Pain control was initially an issue and she was started on fentanyl patch with p.r.n. OxyIR. This had been effective In pain management.  During the patient's stay in Rehab, weekly team conferences were held to monitor patient's progress, set goals, as well as discuss barriers to discharge.  Physical Therapy had worked with the patient on balance, strength, as well as activity tolerance.  The patient is currently able to navigate wheelchair for short distance with supervision.  She continues to have inconsistent safety awareness as well as deficits in problem solving and is at high risk for falls.  She is currently at supervision level for  transfers. Supervision level for ambulating short distances.  The patient was unwilling to stay longer to reach higher goal and she was unwilling to have her family participate in family education.  OT had worked with the patient on self-care tasks.  The patient was at modified independent level for upper body bathing and dressing as well as grooming tasks.  She required supervision for transfers with min verbal cues for wheelchair parts management.  She required close supervision for tub-and-toilet transfers with min verbal cues.  She was at supervision for simple meal planning and IADL tasks in kitchen.  Further follow up Home health, PT, OT, RN, social worker has been set up via [facility].  On July 17, 2011, the patient is discharged to home.  DISCHARGE MEDICATIONS: 1. OxyIR 10 mg 1-1.5 p.o. q.4 hours p.r.n. moderate-to-severe pain,     #60 prescribed. 2. Lantus insulin 5 units at bedtime. 3. Amlodipine 10 mg a day. 4. Aspirin 81 mg a day. 5. Fentanyl patch 25 mcg an hour, change every 3 days.  Next change on     Monday, 2 boxes prescribed. 6. Ferrous sulfate 325 mg b.i.d. 7. NovoLog 3 units t.i.d. with meals per sliding scale at home.  DIET:  Diabetic diet.  ACTIVITY LEVEL:  As tolerated with 24 hours supervision.  SPECIAL INSTRUCTIONS:  Needs supervision for transfers and mobility due to safety and to prevent falls.  No alcohol.  Wear shoe on left foot when out of bed.  Check blood sugars before meals and at bedtime.  [facility] to provide PT, OT, RN, and Child psychotherapist.  Wound care: Wash with soap and water, keep clean and dry.    FOLLOWUP:  The patient is to follow up with Dr. [name] on July 17 at 9:45.  Follow up with Dr. Faith Rogue on August 20 at 9:30 a.m. Follow up with Dr. [name] on July 22 at 10 a.m.     Delle Reining, P.A.     PL/MEDQ  D:  07/16/2011  T:  07/16/2011  Job:  [id number]  cc:   [name], MD [name], MD Ranelle Oyster

## 2011-07-16 NOTE — Progress Notes (Signed)
Reviewed and in agreement with treatment provided.  

## 2011-07-16 NOTE — Progress Notes (Signed)
1425 Pt. Discharged to home with parents.  Marissa Nestle, PA provided discharge instructions via handouts. No further questions asked.  All personal belongings in tow.   Escorted to car by [place], NT.  Pain medication provided before discharge.

## 2011-07-16 NOTE — Progress Notes (Signed)
Orthopedic Tech Progress Note Patient Details:  [name] 08-10-1965 [id number]  Patient ID: [name], female   DOB: 1965/03/18, 46 y.o.   MRN: [id number] Confirmed brace has been delivered to patient.  Donyale Berthold T 07/16/2011, 11:35 AM

## 2011-07-16 NOTE — Progress Notes (Signed)
Discharge summary [phone]

## 2011-07-16 NOTE — Progress Notes (Signed)
Social Work Discharge Note Discharge Note  The overall goal for the admission was met for:   Discharge location: Yes-HOME WITH MOTHER HELPING HER FOR A SHORT TIME  Length of Stay: Yes-7 DAYS  Discharge activity level: Yes-SUPERVISION/MIN LEVEL  Home/community participation: Yes  Services provided included: MD, RD, PT, OT, RN, CM, TR, Pharmacy and SW  Financial Services: Private Insurance: [facility]  Follow-up services arranged: Home Health: [facility]-PT,OT,RN,SW, DME: [facility]-WHEELCHAIR, ROLLING WALKER, DROP-ARM BSC, HOSPTIAL BED and Patient/Family request agency HH: [facility], DME: [facility]  Comments (or additional information):PT DID NOT REACH SOME OF HER GOALS DUE TO HER INSISTENCE TO GO HOME TODAY.  SHE IS AWARE OF TEAM'S CONCERNS REGARDING TAKING CARE OF HERSELF AND HER CHILDREN SHE IS A COMPETENT ADULT WHO CAN MAKE HER OWN DECISIONS EVEN IF NOT THE BEST FOR HERSELF.  WILL HAVE HHSW INVOLVED TO MONITOR THE HOME SITUATION AND ASSIST WITH RESOURCES-SSD,MEDICAID APP AND RESOURCES FOR THE CHILDREN.  Patient/Family verbalized understanding of follow-up arrangements: Yes  Individual responsible for coordination of the follow-up plan: PATIENT  Confirmed correct DME delivered: Lucy Chris 07/16/2011    Lucy Chris

## 2011-07-16 NOTE — Progress Notes (Signed)
Orthopedic Tech Progress Note Patient Details:  [name] November 12, 1965 [id number]  Patient ID: [name], female   DOB: 05/30/1965, 46 y.o.   MRN: [id number] Brace order completed by Storm Frisk, Theodore Rahrig 07/16/2011, 1:54 PM

## 2011-07-16 NOTE — Progress Notes (Signed)
Patient ID: [name], female   DOB: [birth year], 46 y.o.   MRN: [id number] Subjective/Complaints: No new complaints. Going home today .Marland KitchenA 12 point review of systems has been performed and if not noted above is otherwise negative.   Objective: Vital Signs: Blood pressure 126/76, pulse 92, temperature 98.1 F (36.7 C), temperature source Oral, resp. rate 18, weight 95.5 kg (210 lb 8.6 oz), last menstrual period 06/17/2011, SpO2 97.00%. No results found. No results found for this basename: WBC:2,HGB:2,HCT:2,PLT:2 in the last 72 hours No results found for this basename: NA:2,K:2,CL:2,CO2:2,GLUCOSE:2,BUN:2,CREATININE:2,CALCIUM:2 in the last 72 hours CBG (last 3)   Basename 07/16/11 0708 07/15/11 2100 07/15/11 1626  GLUCAP 150* 117* 128*    Wt Readings from Last 3 Encounters:  07/16/11 95.5 kg (210 lb 8.6 oz)  07/09/11 100 kg (220 lb 7.4 oz)  07/09/11 100 kg (220 lb 7.4 oz)    Physical Exam:  .. General: Alert and oriented x 3 HEENT: Head is normocephalic, atraumatic, PERRLA, EOMI, sclera anicteric, oral mucosa pink and moist, dentition intact, ext ear canals clear,  Neck: Supple without JVD or lymphadenopathy Heart: Reg rate and rhythm. No murmurs rubs or gallops Chest: CTA bilaterally without wheezes, rales, or rhonchi; no distress Abdomen: Soft, non-tender, non-distended, bowel sounds positive. Extremities: No clubbing, cyanosis, or edema. Pulses are 2+ Skin: Clean and intact wound without drainage. Suture in place. Callous/wound  on the left MT heads 1-2 Neuro: Pt is cognitively appropriate with normal insight, memory, and awareness. Cranial nerves 2-12 are intact. Sensory exam is notable for mild stocking glove sensory loss. distally. Reflexes are 2+ in all 4's. Fine motor coordination is intact. No tremors. Motor function is grossly 5/5 except at affected limb.  Musculoskeletal: Full ROM, No pain with AROM or PROM in the neck, trunk, or extremities. Posture appropriate.  Right BKA is clean and well shaped. Minimally tender. Psych: Pt's affect is appropriate. Pt is cooperative    Assessment/Plan: 1. Functional deficits secondary to right BKA which require 3+ hours per day of interdisciplinary therapy in a comprehensive inpatient rehab setting. Physiatrist is providing close team supervision and 24 hour management of active medical problems listed below. Physiatrist and rehab team continue to assess barriers to discharge/monitor patient progress toward functional and medical goals.  Dc planning reviewed. Discussed vigilance and regular follow up for her medical issues. Continue ace wrapping of limb. No stump shrinker yet F/u with me and ortho next month.  FIM: FIM - Bathing Bathing Steps Patient Completed: Chest;Right Arm;Left Arm;Abdomen;Front perineal area;Buttocks;Right upper leg;Left upper leg;Left lower leg (including foot) Bathing: 6: Assistive device (Comment) (seated position)  FIM - Upper Body Dressing/Undressing Upper body dressing/undressing steps patient completed: Thread/unthread right bra strap;Thread/unthread left bra strap;Hook/unhook bra;Thread/unthread right sleeve of pullover shirt/dresss;Thread/unthread left sleeve of pullover shirt/dress;Put head through opening of pull over shirt/dress;Pull shirt over trunk Upper body dressing/undressing: 7: Complete Independence: No helper FIM - Lower Body Dressing/Undressing Lower body dressing/undressing steps patient completed: Thread/unthread right underwear leg;Thread/unthread left underwear leg;Pull underwear up/down;Thread/unthread right pants leg;Thread/unthread left pants leg;Pull pants up/down;Don/Doff left sock;Don/Doff left shoe;Fasten/unfasten left shoe Lower body dressing/undressing: 6: Assistive device (Comment) (sit/stand position)  FIM - Toileting Toileting steps completed by patient: Adjust clothing prior to toileting;Adjust clothing after toileting;Performs perineal hygiene Toileting  Assistive Devices: Grab bar or rail for support Toileting: 5: Supervision: Safety issues/verbal cues  FIM - Diplomatic Services operational officer Devices: Bedside commode Toilet Transfers: 5-To toilet/BSC: Supervision (verbal cues/safety issues);5-From toilet/BSC: Supervision (verbal cues/safety issues)  FIM -  Bed/Chair Transport planner Devices: Arm rests Bed/Chair Transfer: 6: Supine > Sit: No assist;6: Sit > Supine: No assist;5: Bed > Chair or W/C: Supervision (verbal cues/safety issues);5: Chair or W/C > Bed: Supervision (verbal cues/safety issues)  FIM - Locomotion: Wheelchair Distance: >150' Locomotion: Wheelchair: 6: Travels 150 ft or more, turns around, maneuvers to table, bed or toilet, negotiates 3% grade: maneuvers on rugs and over door sills independently FIM - Locomotion: Ambulation Locomotion: Ambulation Assistive Devices: Designer, industrial/product Ambulation/Gait Assistance: 5: Supervision Locomotion: Ambulation: 2: Travels 50 - 149 ft with supervision/safety issues  Comprehension Comprehension Mode: Auditory Comprehension: 5-Understands complex 90% of the time/Cues < 10% of the time  Expression Expression Mode: Verbal Expression: 5-Expresses complex 90% of the time/cues < 10% of the time  Social Interaction Social Interaction: 5-Interacts appropriately 90% of the time - Needs monitoring or encouragement for participation or interaction.  Problem Solving Problem Solving: 5-Solves complex 90% of the time/cues < 10% of the time  Memory Memory: 5-Recognizes or recalls 90% of the time/requires cueing < 10% of the time  1. DVT Prophylaxis/Anticoagulation: Pharmaceutical: Lovenox  2. Pain Management: prn oxycodone effective.  3. Mood: positive reinforcement by team. 4. Neuropsych: This patient is capable of making decisions on his/her own behalf.  5. Left foot ulcer: healed. Pressure relief measures to prevent reopening/breakdown. The patient has  custom insoles and shoes through [employer]. She should wear these with mobility attempts.  6. DM type 2: monitor with AC/HS cbg checks. Continue lantus- fair control thus far.  7. Fevers: resolved.  Continue 2 weeks total   wound healing well 8. HTN: ACE discontinued due to renal insufficiency. Continue norvasc. Continue to monitor with bid checks.  9. Anemia: Continue iron supplement. hgb stable  LOS (Days) 7 A FACE TO FACE EVALUATION WAS PERFORMED  Jacquelynn Cree 07/16/2011, 8:59 AM

## 2011-07-27 ENCOUNTER — Telehealth: Payer: Self-pay | Admitting: *Deleted

## 2011-07-27 NOTE — Telephone Encounter (Signed)
FYI - pt is refusing visits.

## 2011-07-30 ENCOUNTER — Telehealth: Payer: Self-pay

## 2011-07-30 NOTE — Telephone Encounter (Signed)
[name] with [facility] called to make [place] aware they have not gotten to evaluate pt yet because she is refusing care at this time.

## 2011-08-19 ENCOUNTER — Telehealth: Payer: Self-pay | Admitting: *Deleted

## 2011-08-19 NOTE — Telephone Encounter (Signed)
Unable to make contact with patient to make visits. [name] is not answering phone calls or returning phone calls.  We have made numerous attempts. We will need to discharge patient.  Message noted.

## 2011-08-24 ENCOUNTER — Encounter: Payer: 59 | Attending: Physical Medicine & Rehabilitation | Admitting: Physical Medicine & Rehabilitation

## 2012-03-01 ENCOUNTER — Other Ambulatory Visit (HOSPITAL_COMMUNITY)
Admission: RE | Admit: 2012-03-01 | Discharge: 2012-03-01 | Disposition: A | Discharge status: Home / Self Care | Payer: 59 | Source: Ambulatory Visit | Attending: Family Medicine | Admitting: Family Medicine

## 2012-03-01 ENCOUNTER — Other Ambulatory Visit: Payer: Self-pay | Admitting: Family Medicine

## 2012-03-01 DIAGNOSIS — Z01419 Encounter for gynecological examination (general) (routine) without abnormal findings: Secondary | ICD-10-CM | POA: Insufficient documentation

## 2012-03-02 ENCOUNTER — Other Ambulatory Visit: Payer: Self-pay | Admitting: Family Medicine

## 2012-03-02 DIAGNOSIS — R609 Edema, unspecified: Secondary | ICD-10-CM

## 2012-03-02 DIAGNOSIS — Z1231 Encounter for screening mammogram for malignant neoplasm of breast: Secondary | ICD-10-CM

## 2012-03-03 ENCOUNTER — Other Ambulatory Visit: Payer: Self-pay | Admitting: *Deleted

## 2012-03-03 DIAGNOSIS — I739 Peripheral vascular disease, unspecified: Secondary | ICD-10-CM

## 2012-03-06 ENCOUNTER — Encounter: Payer: Self-pay | Admitting: Vascular Surgery

## 2012-03-07 ENCOUNTER — Encounter: Payer: 59 | Admitting: Vascular Surgery

## 2012-03-13 ENCOUNTER — Telehealth: Payer: Self-pay | Admitting: Oncology

## 2012-03-13 NOTE — Telephone Encounter (Signed)
Left pt vm to return call about np appt. °

## 2012-03-14 ENCOUNTER — Ambulatory Visit
Admission: RE | Admit: 2012-03-14 | Discharge: 2012-03-14 | Disposition: A | Discharge status: Home / Self Care | Payer: 59 | Source: Ambulatory Visit | Attending: Family Medicine | Admitting: Family Medicine

## 2012-03-14 DIAGNOSIS — R609 Edema, unspecified: Secondary | ICD-10-CM

## 2012-03-28 ENCOUNTER — Encounter: Payer: 59 | Admitting: Vascular Surgery

## 2012-03-29 ENCOUNTER — Encounter: Payer: 59 | Admitting: Vascular Surgery

## 2012-04-26 ENCOUNTER — Encounter: Payer: Self-pay | Admitting: Vascular Surgery

## 2012-04-27 ENCOUNTER — Encounter: Payer: Self-pay | Admitting: Vascular Surgery

## 2012-04-27 ENCOUNTER — Encounter (INDEPENDENT_AMBULATORY_CARE_PROVIDER_SITE_OTHER): Payer: 59 | Admitting: *Deleted

## 2012-04-27 ENCOUNTER — Ambulatory Visit (INDEPENDENT_AMBULATORY_CARE_PROVIDER_SITE_OTHER): Payer: 59 | Admitting: Vascular Surgery

## 2012-04-27 VITALS — BP 136/90 | HR 95 | Resp 18 | Ht 67.0 in | Wt 200.0 lb

## 2012-04-27 DIAGNOSIS — I739 Peripheral vascular disease, unspecified: Secondary | ICD-10-CM

## 2012-04-27 NOTE — Progress Notes (Signed)
[facility] [place] HISTORY AND PHYSICAL   History of Present Illness:  Patient is a 47 y.o. year old female who presents for evaluation of Left LE.  She previously had an infection in her right foot and ultimately ended with a right below-knee amputation. This was done by Dr. [name] in 2012. Other medical problems include DM, hypertension, and neuropathy.  She denies hypercholesterolemia but states she "is borderline".  She has a prosthetic for her right leg but is currently not using this.  Past Medical History  Diagnosis Date  . Diabetes mellitus   . Hypertension   . Arthritis   . Neuropathy   . Diabetic foot ulcer associated with type 2 diabetes mellitus   . Intracerebral hemorrhage As a teenager     States she had burst blood vessel as teenager, now with resultant minor visual field and hearing deficits  . Stroke     Past Surgical History  Procedure Laterality Date  . Cesarean section    . Amputation  11/24/2010    Procedure: AMPUTATION FOOT;  Surgeon: [name], MD;  Location: [facility] OR;  Service: Orthopedics;  Laterality: Right;  Right  FOOT TRANS METATARSAL AMPUTATION  . I&d extremity  11/24/2010    Procedure: IRRIGATION AND DEBRIDEMENT EXTREMITY;  Surgeon: [name], MD;  Location: [facility] OR;  Service: Orthopedics;  Laterality: Left;  Debriedment of left plantar ulcer and trimming of toenails  . Amputation  07/02/2011    Procedure: AMPUTATION BELOW KNEE;  Surgeon: [name], MD;  Location: [facility] OR;  Service: Orthopedics;  Laterality: Right;  . Application of wound vac  07/02/2011    Procedure: APPLICATION OF WOUND VAC;  Surgeon: [name], MD;  Location: [facility] OR;  Service: Orthopedics;  Laterality: Right;  . I&d extremity  07/02/2011    Procedure: IRRIGATION AND DEBRIDEMENT EXTREMITY;  Surgeon: [name], MD;  Location: [facility] OR;  Service: Orthopedics;  Laterality: Left;  I & D of Left Foot  . I&d extremity  07/06/2011    Procedure: IRRIGATION AND DEBRIDEMENT  EXTREMITY;  Surgeon: [name], MD;  Location: [facility] OR;  Service: Orthopedics;  Laterality: Right;  IRRIGATION/DEBRIDEMENT RIGHT BELOW KNEE AMPUTATION  . Brain surgery  1980's    Previous brain surgery in [place], [place]     Social History History  Substance Use Topics  . Smoking status: Never Smoker   . Smokeless tobacco: Not on file  . Alcohol Use: No    Family History Family History  Problem Relation Age of Onset  . Diabetes Mother   . Hypertension Mother   . Hyperlipidemia Mother   . Diabetes Father   . Cancer Father     prostate  . Hyperlipidemia Father   . Hypertension Father   . Diabetes Brother   . Peripheral Artery Disease Brother     has had toes amputated    Allergies  No Known Allergies   Current Outpatient Prescriptions  Medication Sig Dispense Refill  . amLODipine (NORVASC) 10 MG tablet Take 1 tablet (10 mg total) by mouth daily.  30 tablet  0  . aspirin EC 81 MG tablet Take 81 mg by mouth daily.      . ferrous sulfate 325 (65 FE) MG tablet Take 325 mg by mouth 2 (two) times daily with a meal.      . insulin aspart (NOVOLOG) 100 UNIT/ML injection Inject 3 Units into the skin 3 (three) times daily with meals.  1 vial  0  . insulin  aspart (NOVOLOG) 100 UNIT/ML injection Before each meal 3 times a day, 140-199 - 2 units, 200-250 - 4 units, 251-299 - 6 units,  300-349 - 8 units,  350 or above 10 units.  1 vial  0  . insulin glargine (LANTUS) 100 UNIT/ML injection Inject 5 Units into the skin at bedtime.  6 mL  1   No current facility-administered medications for this visit.    ROS:   General:  No weight loss, Fever, chills  HEENT: No recent headaches, no nasal bleeding, no visual changes, no sore throat  Neurologic: No dizziness, blackouts, seizures. No recent symptoms of stroke or mini- stroke. No recent episodes of slurred speech, or temporary blindness. History of stroke.  Cardiac: No recent episodes of chest pain/pressure, no shortness of breath at  rest.  No shortness of breath with exertion.  Denies history of atrial fibrillation or irregular heartbeat  Vascular: No history of rest pain in feet.  No history of claudication.  No history of non-healing ulcer, No history of DVT   Pulmonary: No home oxygen, no productive cough, no hemoptysis,  No asthma or wheezing  Musculoskeletal:  [ ]  Arthritis, [ ]  Low back pain,  [ ]  Joint pain [x]   Right BKA   Hematologic:No history of hypercoagulable state.  No history of easy bleeding.  Positive history of anemia  Gastrointestinal: No hematochezia or melena,  No gastroesophageal reflux, no trouble swallowing  Urinary: [ ]  chronic Kidney disease, [ ]  on HD - [ ]  MWF or [ ]  TTHS, [ ]  Burning with urination, [ ]  Frequent urination, [ ]  Difficulty urinating;   Skin: No rashes  Psychological: No history of anxiety,  No history of depression   Physical Examination  Filed Vitals:   04/27/12 1401  BP: 136/90  Pulse: 95  Resp: 18  Height: 5\' 7"  (1.702 m)  Weight: 200 lb (90.719 kg)  SpO2: 98%    Body mass index is 31.32 kg/(m^2).  General:  Alert and oriented, no acute distress HEENT: Normal Neck: No bruit or JVD Pulmonary: Clear to auscultation bilaterally Cardiac: Regular Rate and Rhythm without murmur Abdomen: Soft, non-tender, non-distended, no mass, no scars Skin: No rash, no open wounds/ulcers on her left foot or leg. Extremity Pulses: BKA right well-healed LE  2+ radial, brachial, femoral, 2+ left dorsalis pedis pulse Musculoskeletal: No deformity or edema  Neurologic: Upper and lower extremity motor 5/5 and symmetric Sensation decreased left foot compared to upper leg peripheral neuropathy.  DATA: ABI Biphasic flow Left LE. I reviewed and interpreted this study ABI was 1.02 with a toe pressure 149 biphasic waveforms. This is a normal study.   ASSESSMENT: Patient with diabetes and neuropathy. No evidence of peripheral arterial disease. DM S/P right BKA secondary to  infection. Biphasic blood flow left LE with palpable pulses.   PLAN: Recommend podiatry visits regular and check feet daily. It is recommended that she be on a statin drug daily due to her co morbidities.  We asked that she discuss this with her primary care doctor.  She may benefit from gabapentin or Lyrica for her phantom limb pain secondary to amputation of her right leg. She will followup with [place] on as-needed basis.  Thomasena Edis, Rosalina Dingwall [facility] PA-C [facility] [place] Office: [phone]   History and exam details as above. I examined the patient and obtained history with Lianne Cure.  The patient has a normal lower extremity arterial exam and normal ABIs. Her primary problem is peripheral  neuropathy secondary to her diabetes. Foot care was discussed with the patient today. She will followup as needed.  Fabienne Bruns, MD [facility] [place] Office: [phone] Pager: [phone]

## 2012-11-14 ENCOUNTER — Other Ambulatory Visit: Payer: Self-pay | Admitting: Internal Medicine

## 2012-11-16 ENCOUNTER — Other Ambulatory Visit: Payer: Self-pay | Admitting: *Deleted

## 2012-11-16 DIAGNOSIS — E1159 Type 2 diabetes mellitus with other circulatory complications: Secondary | ICD-10-CM

## 2012-11-16 DIAGNOSIS — R0989 Other specified symptoms and signs involving the circulatory and respiratory systems: Secondary | ICD-10-CM

## 2012-12-13 ENCOUNTER — Encounter: Payer: Self-pay | Admitting: Vascular Surgery

## 2012-12-14 ENCOUNTER — Encounter: Payer: Self-pay | Admitting: Vascular Surgery

## 2012-12-14 ENCOUNTER — Ambulatory Visit (HOSPITAL_COMMUNITY)
Admission: RE | Admit: 2012-12-14 | Discharge: 2012-12-14 | Disposition: A | Discharge status: Home / Self Care | Payer: 59 | Source: Ambulatory Visit | Attending: Vascular Surgery | Admitting: Vascular Surgery

## 2012-12-14 ENCOUNTER — Ambulatory Visit (INDEPENDENT_AMBULATORY_CARE_PROVIDER_SITE_OTHER): Payer: 59 | Admitting: Vascular Surgery

## 2012-12-14 VITALS — BP 143/71 | HR 99 | Ht 67.0 in | Wt 200.0 lb

## 2012-12-14 DIAGNOSIS — E1159 Type 2 diabetes mellitus with other circulatory complications: Secondary | ICD-10-CM

## 2012-12-14 DIAGNOSIS — I739 Peripheral vascular disease, unspecified: Secondary | ICD-10-CM

## 2012-12-14 DIAGNOSIS — R0989 Other specified symptoms and signs involving the circulatory and respiratory systems: Secondary | ICD-10-CM | POA: Insufficient documentation

## 2012-12-14 NOTE — Progress Notes (Signed)
[facility] [place] HISTORY AND PHYSICAL    History of Present Illness:  Patient is a 47 y.o. year old female who presents for evaluation of Left LE.  she has been getting swelling in her left leg. The swelling is chronic and has been present off and on for several years. This improved with elevation. She also has developed several ulcerations on her left leg that are slowly healing. These are currently under evaluation by dermatology. She previously had an infection in her right foot and ultimately ended with a right below-knee amputation. This was done by Dr. [name] in 2012. Other medical problems include DM, hypertension, and neuropathy.  She denies hypercholesterolemia but states she "is borderline".  She has a prosthetic for her right leg but is currently not using this.    Past Medical History   Diagnosis  Date   .  Diabetes mellitus     .  Hypertension     .  Arthritis     .  Neuropathy     .  Diabetic foot ulcer associated with type 2 diabetes mellitus     .  Intracerebral hemorrhage  As a teenager        States she had burst blood vessel as teenager, now with resultant minor visual field and hearing deficits   .  Stroke         Past Surgical History   Procedure  Laterality  Date   .  Cesarean section       .  Amputation    11/24/2010       Procedure: AMPUTATION FOOT;  Surgeon: [name], MD;  Location: [facility] OR;  Service: Orthopedics;  Laterality: Right;  Right  FOOT TRANS METATARSAL AMPUTATION   .  I&d extremity    11/24/2010       Procedure: IRRIGATION AND DEBRIDEMENT EXTREMITY;  Surgeon: [name], MD;  Location: [facility] OR;  Service: Orthopedics;  Laterality: Left;  Debriedment of left plantar ulcer and trimming of toenails   .  Amputation    07/02/2011       Procedure: AMPUTATION BELOW KNEE;  Surgeon: [name], MD;  Location: [facility] OR;  Service: Orthopedics;  Laterality: Right;   .  Application of wound vac    07/02/2011       Procedure: APPLICATION OF WOUND  VAC;  Surgeon: [name], MD;  Location: [facility] OR;  Service: Orthopedics;  Laterality: Right;   .  I&d extremity    07/02/2011       Procedure: IRRIGATION AND DEBRIDEMENT EXTREMITY;  Surgeon: [name], MD;  Location: [facility] OR;  Service: Orthopedics;  Laterality: Left;  I & D of Left Foot   .  I&d extremity    07/06/2011       Procedure: IRRIGATION AND DEBRIDEMENT EXTREMITY;  Surgeon: [name], MD;  Location: [facility] OR;  Service: Orthopedics;  Laterality: Right;  IRRIGATION/DEBRIDEMENT RIGHT BELOW KNEE AMPUTATION   .  Brain surgery    1980's       Previous brain surgery in [place], [place]      Social History History   Substance Use Topics   .  Smoking status:  Never Smoker    .  Smokeless tobacco:  Not on file   .  Alcohol Use:  No     Family History Family History   Problem  Relation  Age of Onset   .  Diabetes  Mother     .  Hypertension  Mother     .  Hyperlipidemia  Mother     .  Diabetes  Father     .  Cancer  Father         prostate   .  Hyperlipidemia  Father     .  Hypertension  Father     .  Diabetes  Brother     .  Peripheral Artery Disease  Brother         has had toes amputated     Allergies  No Known Allergies      Current Outpatient Prescriptions on File Prior to Visit  Medication Sig Dispense Refill  . aspirin EC 81 MG tablet Take 81 mg by mouth daily.      . ferrous sulfate 325 (65 FE) MG tablet Take 325 mg by mouth 2 (two) times daily with a meal.      . insulin aspart (NOVOLOG) 100 UNIT/ML injection Before each meal 3 times a day, 140-199 - 2 units, 200-250 - 4 units, 251-299 - 6 units,  300-349 - 8 units,  350 or above 10 units.  1 vial  0  . insulin glargine (LANTUS) 100 UNIT/ML injection Inject 5 Units into the skin at bedtime.  6 mL  1  . amLODipine (NORVASC) 10 MG tablet Take 1 tablet (10 mg total) by mouth daily.  30 tablet  0  . insulin aspart (NOVOLOG) 100 UNIT/ML injection Inject 3 Units into the skin 3 (three) times daily with meals.  1 vial  0   No  current facility-administered medications on file prior to visit.    ROS:    General:  No weight loss, Fever, chills  Cardiac: No recent episodes of chest pain/pressure, no shortness of breath at rest.  No shortness of breath with exertion.  Denies history of atrial fibrillation or irregular heartbeat  Vascular: No history of rest pain in feet.  No history of claudication.  No history of non-healing ulcer, No history of DVT     Physical Examination   Filed Vitals:   12/14/12 1106  BP: 143/71  Pulse: 99  Height: 5\' 7"  (1.702 m)  Weight: 200 lb (90.719 kg)  SpO2: 97%      Body mass index is 31.32 kg/(m^2).  General:  Alert and oriented, no acute distress HEENT: Normal Neck: No bruit or JVD Pulmonary: Clear to auscultation bilaterally Cardiac: Regular Rate and Rhythm without murmur Skin: No rash, multiple areas of raised eschar nodules left calf area and right anterior thigh no surrounding erythema no drainage all are in various stages of healing Extremity Pulses: BKA right well-healed LE  2+ radial, brachial, femoral, 2+ left dorsalis pedis pulse Musculoskeletal: No deformity trace left leg edema     Neurologic: Upper and lower extremity motor 5/5 and symmetric Sensation decreased left foot compared to upper leg which is chronic  DATA: Patient had ABIs performed today. I reviewed and interpreted this study.  ABI was 0.91 with triphasic waveforms digit pressure over 100. This is a normal study.   ASSESSMENT: Patient with diabetes and neuropathy. No evidence of peripheral arterial disease. DM S/P right BKA secondary to infection. Chronic leg swelling most likely secondary to dependency as she dangles his foot down all in a wheelchair all day long. She may have some venous reflux as well. Biphasic blood flow left LE with palpable pulses. Arterial circulation is normal.   PLAN: Recommend podiatry visits regular and check feet daily. Patient will continue her followup with  dermatology for her leg nodules.  She will elevate her leg when possible. She was also given a prescription today for left lower extremity compression stocking for relief of swelling symptoms. She will followup with [place] on as-needed basis.   Fabienne Bruns, MD [facility] [place] Office: [phone] Pager: [phone]

## 2013-11-23 ENCOUNTER — Ambulatory Visit: Payer: 59 | Admitting: "Endocrinology

## 2015-08-23 ENCOUNTER — Encounter (HOSPITAL_COMMUNITY): Payer: Self-pay | Admitting: Emergency Medicine

## 2015-08-23 ENCOUNTER — Inpatient Hospital Stay (HOSPITAL_COMMUNITY): Payer: Medicaid Other

## 2015-08-23 ENCOUNTER — Inpatient Hospital Stay (HOSPITAL_COMMUNITY)
Admission: EM | Admit: 2015-08-23 | Discharge: 2015-09-04 | DRG: 699 | Disposition: A | Discharge status: Rehabilitation Facility | Payer: Medicaid Other | Attending: Internal Medicine | Admitting: Internal Medicine

## 2015-08-23 DIAGNOSIS — Z8249 Family history of ischemic heart disease and other diseases of the circulatory system: Secondary | ICD-10-CM

## 2015-08-23 DIAGNOSIS — N319 Neuromuscular dysfunction of bladder, unspecified: Secondary | ICD-10-CM

## 2015-08-23 DIAGNOSIS — E1122 Type 2 diabetes mellitus with diabetic chronic kidney disease: Principal | ICD-10-CM | POA: Diagnosis present

## 2015-08-23 DIAGNOSIS — B372 Candidiasis of skin and nail: Secondary | ICD-10-CM | POA: Diagnosis present

## 2015-08-23 DIAGNOSIS — I517 Cardiomegaly: Secondary | ICD-10-CM | POA: Diagnosis present

## 2015-08-23 DIAGNOSIS — N390 Urinary tract infection, site not specified: Secondary | ICD-10-CM | POA: Diagnosis present

## 2015-08-23 DIAGNOSIS — E1165 Type 2 diabetes mellitus with hyperglycemia: Secondary | ICD-10-CM | POA: Diagnosis present

## 2015-08-23 DIAGNOSIS — E11621 Type 2 diabetes mellitus with foot ulcer: Secondary | ICD-10-CM | POA: Diagnosis present

## 2015-08-23 DIAGNOSIS — R52 Pain, unspecified: Secondary | ICD-10-CM

## 2015-08-23 DIAGNOSIS — R195 Other fecal abnormalities: Secondary | ICD-10-CM

## 2015-08-23 DIAGNOSIS — E1142 Type 2 diabetes mellitus with diabetic polyneuropathy: Secondary | ICD-10-CM

## 2015-08-23 DIAGNOSIS — Z89519 Acquired absence of unspecified leg below knee: Secondary | ICD-10-CM

## 2015-08-23 DIAGNOSIS — D696 Thrombocytopenia, unspecified: Secondary | ICD-10-CM | POA: Diagnosis present

## 2015-08-23 DIAGNOSIS — L989 Disorder of the skin and subcutaneous tissue, unspecified: Secondary | ICD-10-CM

## 2015-08-23 DIAGNOSIS — L280 Lichen simplex chronicus: Secondary | ICD-10-CM | POA: Diagnosis present

## 2015-08-23 DIAGNOSIS — R5381 Other malaise: Secondary | ICD-10-CM | POA: Diagnosis not present

## 2015-08-23 DIAGNOSIS — R310 Gross hematuria: Secondary | ICD-10-CM | POA: Diagnosis present

## 2015-08-23 DIAGNOSIS — R600 Localized edema: Secondary | ICD-10-CM | POA: Diagnosis present

## 2015-08-23 DIAGNOSIS — B379 Candidiasis, unspecified: Secondary | ICD-10-CM

## 2015-08-23 DIAGNOSIS — Z8673 Personal history of transient ischemic attack (TIA), and cerebral infarction without residual deficits: Secondary | ICD-10-CM

## 2015-08-23 DIAGNOSIS — N1330 Unspecified hydronephrosis: Secondary | ICD-10-CM | POA: Diagnosis not present

## 2015-08-23 DIAGNOSIS — E874 Mixed disorder of acid-base balance: Secondary | ICD-10-CM | POA: Diagnosis present

## 2015-08-23 DIAGNOSIS — N179 Acute kidney failure, unspecified: Secondary | ICD-10-CM | POA: Diagnosis present

## 2015-08-23 DIAGNOSIS — R05 Cough: Secondary | ICD-10-CM

## 2015-08-23 DIAGNOSIS — E1140 Type 2 diabetes mellitus with diabetic neuropathy, unspecified: Secondary | ICD-10-CM | POA: Diagnosis not present

## 2015-08-23 DIAGNOSIS — F419 Anxiety disorder, unspecified: Secondary | ICD-10-CM | POA: Diagnosis present

## 2015-08-23 DIAGNOSIS — G4700 Insomnia, unspecified: Secondary | ICD-10-CM | POA: Diagnosis present

## 2015-08-23 DIAGNOSIS — E119 Type 2 diabetes mellitus without complications: Secondary | ICD-10-CM

## 2015-08-23 DIAGNOSIS — L97509 Non-pressure chronic ulcer of other part of unspecified foot with unspecified severity: Secondary | ICD-10-CM | POA: Diagnosis present

## 2015-08-23 DIAGNOSIS — Z89511 Acquired absence of right leg below knee: Secondary | ICD-10-CM

## 2015-08-23 DIAGNOSIS — N823 Fistula of vagina to large intestine: Secondary | ICD-10-CM

## 2015-08-23 DIAGNOSIS — E876 Hypokalemia: Secondary | ICD-10-CM | POA: Diagnosis present

## 2015-08-23 DIAGNOSIS — D638 Anemia in other chronic diseases classified elsewhere: Secondary | ICD-10-CM | POA: Diagnosis present

## 2015-08-23 DIAGNOSIS — R0682 Tachypnea, not elsewhere classified: Secondary | ICD-10-CM

## 2015-08-23 DIAGNOSIS — Z833 Family history of diabetes mellitus: Secondary | ICD-10-CM

## 2015-08-23 DIAGNOSIS — R202 Paresthesia of skin: Secondary | ICD-10-CM | POA: Diagnosis present

## 2015-08-23 DIAGNOSIS — Z79899 Other long term (current) drug therapy: Secondary | ICD-10-CM

## 2015-08-23 DIAGNOSIS — D689 Coagulation defect, unspecified: Secondary | ICD-10-CM

## 2015-08-23 DIAGNOSIS — I10 Essential (primary) hypertension: Secondary | ICD-10-CM | POA: Diagnosis present

## 2015-08-23 DIAGNOSIS — M549 Dorsalgia, unspecified: Secondary | ICD-10-CM | POA: Diagnosis not present

## 2015-08-23 DIAGNOSIS — E118 Type 2 diabetes mellitus with unspecified complications: Secondary | ICD-10-CM

## 2015-08-23 DIAGNOSIS — K5900 Constipation, unspecified: Secondary | ICD-10-CM | POA: Diagnosis present

## 2015-08-23 DIAGNOSIS — L8990 Pressure ulcer of unspecified site, unspecified stage: Secondary | ICD-10-CM | POA: Diagnosis present

## 2015-08-23 DIAGNOSIS — R339 Retention of urine, unspecified: Secondary | ICD-10-CM | POA: Diagnosis present

## 2015-08-23 DIAGNOSIS — Z7982 Long term (current) use of aspirin: Secondary | ICD-10-CM

## 2015-08-23 DIAGNOSIS — N189 Chronic kidney disease, unspecified: Secondary | ICD-10-CM | POA: Diagnosis present

## 2015-08-23 DIAGNOSIS — D62 Acute posthemorrhagic anemia: Secondary | ICD-10-CM | POA: Diagnosis present

## 2015-08-23 DIAGNOSIS — Z899 Acquired absence of limb, unspecified: Secondary | ICD-10-CM

## 2015-08-23 DIAGNOSIS — R059 Cough, unspecified: Secondary | ICD-10-CM

## 2015-08-23 DIAGNOSIS — Z794 Long term (current) use of insulin: Secondary | ICD-10-CM

## 2015-08-23 DIAGNOSIS — IMO0002 Reserved for concepts with insufficient information to code with codable children: Secondary | ICD-10-CM | POA: Diagnosis present

## 2015-08-23 DIAGNOSIS — N1339 Other hydronephrosis: Secondary | ICD-10-CM | POA: Diagnosis present

## 2015-08-23 DIAGNOSIS — N13729 Vesicoureteral-reflux with reflux nephropathy without hydroureter, unspecified: Secondary | ICD-10-CM | POA: Diagnosis present

## 2015-08-23 DIAGNOSIS — Z9119 Patient's noncompliance with other medical treatment and regimen: Secondary | ICD-10-CM

## 2015-08-23 DIAGNOSIS — R21 Rash and other nonspecific skin eruption: Secondary | ICD-10-CM | POA: Diagnosis present

## 2015-08-23 DIAGNOSIS — E1151 Type 2 diabetes mellitus with diabetic peripheral angiopathy without gangrene: Secondary | ICD-10-CM | POA: Diagnosis present

## 2015-08-23 DIAGNOSIS — Z8042 Family history of malignant neoplasm of prostate: Secondary | ICD-10-CM

## 2015-08-23 DIAGNOSIS — N183 Chronic kidney disease, stage 3 (moderate): Secondary | ICD-10-CM | POA: Diagnosis present

## 2015-08-23 DIAGNOSIS — N3941 Urge incontinence: Secondary | ICD-10-CM | POA: Diagnosis present

## 2015-08-23 DIAGNOSIS — Z91199 Patient's noncompliance with other medical treatment and regimen due to unspecified reason: Secondary | ICD-10-CM

## 2015-08-23 DIAGNOSIS — D649 Anemia, unspecified: Secondary | ICD-10-CM | POA: Diagnosis present

## 2015-08-23 DIAGNOSIS — L281 Prurigo nodularis: Secondary | ICD-10-CM | POA: Diagnosis present

## 2015-08-23 DIAGNOSIS — N182 Chronic kidney disease, stage 2 (mild): Secondary | ICD-10-CM

## 2015-08-23 LAB — COMPREHENSIVE METABOLIC PANEL
ALT: 10 U/L — ABNORMAL LOW (ref 14–54)
AST: 12 U/L — ABNORMAL LOW (ref 15–41)
Albumin: 2.9 g/dL — ABNORMAL LOW (ref 3.5–5.0)
Alkaline Phosphatase: 74 U/L (ref 38–126)
Anion gap: 13 (ref 5–15)
BUN: 94 mg/dL — ABNORMAL HIGH (ref 6–20)
CO2: 9 mmol/L — ABNORMAL LOW (ref 22–32)
Calcium: 7.2 mg/dL — ABNORMAL LOW (ref 8.9–10.3)
Chloride: 111 mmol/L (ref 101–111)
Creatinine, Ser: 8.78 mg/dL — ABNORMAL HIGH (ref 0.44–1.00)
GFR calc Af Amer: 5 mL/min — ABNORMAL LOW (ref >60)
GFR calc non Af Amer: 5 mL/min — ABNORMAL LOW (ref >60)
Glucose, Bld: 93 mg/dL (ref 65–99)
Potassium: 4.3 mmol/L (ref 3.5–5.1)
Sodium: 133 mmol/L — ABNORMAL LOW (ref 135–145)
Total Bilirubin: 1 mg/dL (ref 0.3–1.2)
Total Protein: 7 g/dL (ref 6.5–8.1)

## 2015-08-23 LAB — CBC WITH DIFFERENTIAL/PLATELET
Basophils Absolute: 0 10*3/uL (ref 0.0–0.1)
Basophils Relative: 0 %
Eosinophils Absolute: 0.1 10*3/uL (ref 0.0–0.7)
Eosinophils Relative: 1 %
HCT: 13.6 % — ABNORMAL LOW (ref 36.0–46.0)
Hemoglobin: 4.2 g/dL — CL (ref 12.0–15.0)
Lymphocytes Relative: 13 %
Lymphs Abs: 1.5 10*3/uL (ref 0.7–4.0)
MCH: 25.3 pg — ABNORMAL LOW (ref 26.0–34.0)
MCHC: 30.9 g/dL (ref 30.0–36.0)
MCV: 81.9 fL (ref 78.0–100.0)
Monocytes Absolute: 0.6 10*3/uL (ref 0.1–1.0)
Monocytes Relative: 5 %
Neutro Abs: 9.2 10*3/uL — ABNORMAL HIGH (ref 1.7–7.7)
Neutrophils Relative %: 81 %
Platelets: 10 10*3/uL — CL (ref 150–400)
RBC: 1.66 MIL/uL — ABNORMAL LOW (ref 3.87–5.11)
RDW: 24.1 % — ABNORMAL HIGH (ref 11.5–15.5)
WBC: 11.4 10*3/uL — ABNORMAL HIGH (ref 4.0–10.5)

## 2015-08-23 LAB — URINALYSIS, ROUTINE W REFLEX MICROSCOPIC
Bilirubin Urine: NEGATIVE
Glucose, UA: NEGATIVE mg/dL
Ketones, ur: NEGATIVE mg/dL
Nitrite: NEGATIVE
Protein, ur: 100 mg/dL — AB
Specific Gravity, Urine: 1.014 (ref 1.005–1.030)
pH: 6 (ref 5.0–8.0)

## 2015-08-23 LAB — BRAIN NATRIURETIC PEPTIDE: B Natriuretic Peptide: 793.5 pg/mL — ABNORMAL HIGH (ref 0.0–100.0)

## 2015-08-23 LAB — URINE MICROSCOPIC-ADD ON: RBC / HPF: NONE SEEN RBC/hpf (ref 0–5)

## 2015-08-23 LAB — APTT: aPTT: 41 seconds — ABNORMAL HIGH (ref 24–36)

## 2015-08-23 LAB — D-DIMER, QUANTITATIVE (NOT AT ARMC): D-Dimer, Quant: 3.23 ug/mL-FEU — ABNORMAL HIGH (ref 0.00–0.50)

## 2015-08-23 LAB — PROTIME-INR
INR: 1.49
Prothrombin Time: 18.2 s — ABNORMAL HIGH (ref 11.4–15.2)

## 2015-08-23 LAB — FIBRINOGEN: Fibrinogen: 489 mg/dL — ABNORMAL HIGH (ref 210–475)

## 2015-08-23 LAB — PREPARE RBC (CROSSMATCH)

## 2015-08-23 LAB — RETICULOCYTES
RBC.: 1.76 MIL/uL — ABNORMAL LOW (ref 3.87–5.11)
Retic Count, Absolute: 158.4 K/uL (ref 19.0–186.0)
Retic Ct Pct: 9 % — ABNORMAL HIGH (ref 0.4–3.1)

## 2015-08-23 LAB — LACTATE DEHYDROGENASE: LDH: 204 U/L — ABNORMAL HIGH (ref 98–192)

## 2015-08-23 LAB — FERRITIN: Ferritin: 178 ng/mL (ref 11–307)

## 2015-08-23 LAB — POC OCCULT BLOOD, ED: Fecal Occult Bld: POSITIVE — AB

## 2015-08-23 LAB — VITAMIN B12: Vitamin B-12: 360 pg/mL (ref 180–914)

## 2015-08-23 MED ORDER — FLUCONAZOLE IN SODIUM CHLORIDE 100-0.9 MG/50ML-% IV SOLN
50.0000 mg | INTRAVENOUS | Status: DC
  Administered 2015-08-24 – 2015-08-25 (×2): 50 mg via INTRAVENOUS
  Filled 2015-08-23 (×3): qty 25

## 2015-08-23 MED ORDER — SODIUM CHLORIDE 0.9 % IV SOLN: Freq: Once | INTRAVENOUS | Status: DC

## 2015-08-23 MED ORDER — DEXTROSE-NACL 5-0.9 % IV SOLN
INTRAVENOUS | Status: DC
  Administered 2015-08-24: 150 mL/h via INTRAVENOUS

## 2015-08-23 MED ORDER — DEXTROSE 5 % IV SOLN
1.0000 g | INTRAVENOUS | Status: DC
  Administered 2015-08-23: 1 g via INTRAVENOUS
  Filled 2015-08-23: qty 10

## 2015-08-23 MED ORDER — LEVOFLOXACIN IN D5W 500 MG/100ML IV SOLN
500.0000 mg | INTRAVENOUS | Status: DC
  Administered 2015-08-24 – 2015-08-29 (×4): 500 mg via INTRAVENOUS
  Filled 2015-08-23 (×4): qty 100

## 2015-08-23 MED ORDER — SODIUM CHLORIDE 0.9 % IV SOLN: 250.0000 mL | INTRAVENOUS | Status: DC | PRN

## 2015-08-23 NOTE — Consult Note (Signed)
New Hematology/Oncology Consult   Referral MD: Carlisle Cater        Reason for Referral: Anemia, thrombocytopenia    HPI: [name] presented to the emergency room with a one-week history of generalized weakness, hand "numbness ", and dyspnea. She has a history of diabetes and has not seen a physician in greater than a year. She reports hyperpigmented skin lesions have been present for greater than a year, but there have been new lesions present over the past few weeks. She has a painful rash beneath the breast. No measured fever.   Past Medical History:  Diagnosis Date  . Arthritis   . Diabetes mellitus   . Diabetic foot ulcer associated with type 2 diabetes mellitus ([place])   . Hypertension   . Intracerebral hemorrhage ([place]) As a teenager    States she had burst blood vessel as teenager, now with resultant minor visual field and hearing deficits  . Neuropathy ([place])   . G3 P2    :  Past Surgical History:  Procedure Laterality Date  . AMPUTATION  11/24/2010   Procedure: AMPUTATION FOOT;  Surgeon: Wylene Simmer, MD;  Location: [place];  Service: Orthopedics;  Laterality: Right;  Right  FOOT TRANS METATARSAL AMPUTATION  . AMPUTATION  07/02/2011   Procedure: AMPUTATION BELOW KNEE;  Surgeon: Wylene Simmer, MD;  Location: [place];  Service: Orthopedics;  Laterality: Right;  . APPLICATION OF WOUND VAC  07/02/2011   Procedure: APPLICATION OF WOUND VAC;  Surgeon: Wylene Simmer, MD;  Location: [place];  Service: Orthopedics;  Laterality: Right;  . BRAIN SURGERY  1980's   Previous brain surgery in [place], [place]  . CESAREAN SECTION    . I&D EXTREMITY  11/24/2010   Procedure: IRRIGATION AND DEBRIDEMENT EXTREMITY;  Surgeon: Wylene Simmer, MD;  Location: [place];  Service: Orthopedics;  Laterality: Left;  Debriedment of left plantar ulcer and trimming of toenails  . I&D EXTREMITY  07/02/2011   Procedure: IRRIGATION AND DEBRIDEMENT EXTREMITY;  Surgeon: Wylene Simmer, MD;  Location: [place];  Service: Orthopedics;   Laterality: Left;  I & D of Left Foot  . I&D EXTREMITY  07/06/2011   Procedure: IRRIGATION AND DEBRIDEMENT EXTREMITY;  Surgeon: Wylene Simmer, MD;  Location: [place];  Service: Orthopedics;  Laterality: Right;  IRRIGATION/DEBRIDEMENT RIGHT BELOW KNEE AMPUTATION  :   Current Facility-Administered Medications:  .  0.9 %  sodium chloride infusion, , Intravenous, Once, [employer], PA-C .  cefTRIAXone (ROCEPHIN) 1 g in dextrose 5 % 50 mL IVPB, 1 g, Intravenous, Q24H, Kris Mouton, RPH, Last Rate: 100 mL/hr at 08/23/15 1949, 1 g at 08/23/15 1949  Current Outpatient Prescriptions:  .  aspirin 81 MG EC tablet, Take 162-243 mg by mouth 3 (three) times daily as needed (chest pain). Swallow whole., Disp: , Rfl:  .  Aspirin-Acetaminophen-Caffeine (GOODY HEADACHE PO), Take 1 packet by mouth 2 (two) times daily as needed (headache)., Disp: , Rfl:  .  Menthol, Topical Analgesic, (ICY HOT EX), Apply 1 application topically 2 (two) times daily as needed (pain)., Disp: , Rfl:  .  Naphazoline HCl (CLEAR EYES OP), Place 1 drop into both eyes daily as needed (dry eyes/itching)., Disp: , Rfl:  .  naproxen sodium (ALEVE) 220 MG tablet, Take 440 mg by mouth 2 (two) times daily as needed (pain)., Disp: , Rfl:  .  amLODipine (NORVASC) 10 MG tablet, Take 1 tablet (10 mg total) by mouth daily., Disp: 30 tablet, Rfl: 0 .  insulin aspart (NOVOLOG)  100 UNIT/ML injection, Inject 3 Units into the skin 3 (three) times daily with meals., Disp: 1 vial, Rfl: 0 .  insulin aspart (NOVOLOG) 100 UNIT/ML injection, Before each meal 3 times a day, 140-199 - 2 units, 200-250 - 4 units, 251-299 - 6 units,  300-349 - 8 units,  350 or above 10 units. (Patient not taking: Reported on 08/23/2015), Disp: 1 vial, Rfl: 0 .  insulin glargine (LANTUS) 100 UNIT/ML injection, Inject 5 Units into the skin at bedtime. (Patient not taking: Reported on 08/23/2015), Disp: 6 mL, Rfl: 1:   :  No Known Allergies:  Family History  Problem Relation  Age of Onset  . Diabetes Mother   . Hypertension Mother   . Hyperlipidemia Mother   . Diabetes Father   . Cancer Father     prostate  . Hyperlipidemia Father   . Hypertension Father   . Diabetes Brother   . Peripheral Artery Disease Brother     has had toes amputated  :  Social History   Social History  . Marital status: Widowed     Spouse name: N/A  . Number of children: N/A  . Years of education: N/A   Occupational History  . Homemaker    Social History Main Topics  . Smoking status: Never Smoker  . Smokeless tobacco: Never Used  . Alcohol use She reports alcohol use in the past   . Drug use: No  . Sexual activity: Not on file   Other Topics Concern      Social History Narrative   Has a son and daughter   :  Review of Systems:  Positives include:Generalized weakness, hand numbness, dyspnea, painful rash beneath the breast, blurred vision, the left leg feels heavy    A complete ROS was otherwise negative.   Physical Exam:  Blood pressure 152/75, pulse 77, temperature 97.4 F (36.3 C), temperature source Oral, resp. rate 12, SpO2 100 %.  HEENT: Ecchymosis at the left buccal mucosa, no thrush Lungs: End inspiratory rales at the posterior base bilaterally, no respiratory distress Cardiac: Regular rate and rhythm Abdomen: No hepatosplenomegaly, nontender  Vascular: Trace edema at the left lower leg Lymph nodes: No cervical, supraclavicular, axillary, or inguinal nodes Neurologic: Alert and oriented, the motor exam appears intact in the left leg/foot and arms Skin: Multiple areas of flat hyperpigmentation over the back, raised crusted hyperpigmented lesions over the arms, no chronic pustular lesion at the upper outer left breast, several pustular lesions over the arms and neck, denuded moist rash beneath the breasst and in the groin, few ecchymoses over the extremities Musculoskeletal: Mild spine tenderness  LABS:   Recent Labs  08/23/15 1407  WBC  11.4*  HGB 4.2*  HCT 13.6*  PLT 10*   D-dimer 3.23, fibrinogen 489, PT 18.2, PTT 41  Blood smear: The platelets are markedly decreased in number. No platelet clumps. There are bands and myelocytes. The polychromasia is increased. A few teardrops and nucleated red cells. Rare fragment. Several red cells with dense hemoglobin,? Hemoglobin C cells   Recent Labs  08/23/15 1407  NA 133*  K 4.3  CL 111  CO2 9*  GLUCOSE 93  BUN 94*  CREATININE 8.78*  CALCIUM 7.2*  Total protein 7.0, bilirubin 1.0, LDH 204, albumin 2.9, AST 12   reticulocyte count 158, 9%  Urinalysis-too numerous to count white cells Stool Hemoccult-positive   Assessment and Plan:   1. Severe anemia and thrombocytopenia 2. Renal failure 3. Pustular/ulcerated skin lesions 4.  Yeast rash beneath the breasts and in the groin 5. Diabetes 6. Status post right below-knee amputation in 2012 7. History of "neuropathy "  [name] has multiple laboratory and physical exam findings suggesting a systemic illness. The markers of hemolysis and peripheral blood smear are not suggestive of TTP. I am concerned she has a systemic infection or possibly an underlying hematopoietic malignancy. The differential diagnosis includes multiple myeloma. She has an apparent yeast rash over the trunk and pustular/necrotic skin lesions over the trunk and extremities. She may have a systemic infection such as a MRSA infection.  Recommendations: 1. Blood and urine cultures, broad-spectrum intravenous antibiotics 2. Consult infectious disease and nephrology 3. Consider culture and biopsy of a skin lesion 4. Transfuse 2 units of packed red blood cells tonight 5. Transfuse platelets for a count of less than 10,000 or bleeding 6. Check ferritin and vitamin B 12 levels 7. HIV serology 8. Myeloma panel  Betsy Coder, MD 08/23/2015, 8:06 PM

## 2015-08-23 NOTE — ED Triage Notes (Signed)
Received pt from home with c/o out of her medications for 1 year. Pt does not have insurance. Pt also has multiple other complaints: Pain in hands and feet, rash across chest and under breast, nausea, and headache. CBG 96 for EMS.

## 2015-08-23 NOTE — ED Notes (Signed)
Called lab to add on blood smear. MD reviewing it at this time.

## 2015-08-23 NOTE — ED Notes (Signed)
Main lab called to add on reticulocytes.

## 2015-08-23 NOTE — ED Notes (Signed)
IV team unsuccessful at getting 2nd IV access, even after using ultrasound. MD aware and at the bedside. Patient tearful. Family at bedside.

## 2015-08-23 NOTE — ED Provider Notes (Signed)
[facility]-EM[facility]Y [facility]PT Provider Note   CSN: [id number] Arrival date & time: 08/23/15  1109     History   Chief Complaint Chief Complaint  Patient presents with  . Hand Pain  . Medication Refill  . Nausea  . Rash    HPI [name] is a 50 y.o. female with a pmhx of uncontrolled DM, right BKA, intracerebral hemorrhage, HTN who presents to the Ed today with multiple complaints. Pt lost her insurance 1 year ago and has not seen a doctor or taken any medication in 1 year. Pt states that she has been having worsening nerve pain in her bilateral hands over the last 2 weeks. Pt states that she "feels like she is not in control of her limbs". She is becoming less and less independent. Pt has also noted swelling in her LLE worsening in the last 3 days. Pt states that this has been occurring on and off for 1 years but it has been worse than normal this week. Pt also has a rash under her bilateral breasts that has been present for 3-4 days. Rash is pruritic. No new detergents or soaps. Pt states that she now wears depends because she often has urge incontinence. She has seen blood once or twice in her depends but is not sure where the blood is from. No melena or hematochezia. Pt is short of breath at baseline. She has chronic skin changes. She denies any chest pani or dizziness. NO fever.   HPI  Past Medical History:  Diagnosis Date  . Arthritis   . Diabetes mellitus   . Diabetic foot ulcer associated with type 2 diabetes mellitus ([facility])   . Hypertension   . Intracerebral hemorrhage ([facility]) As a teenager    States she had burst blood vessel as teenager, now with resultant minor visual field and hearing deficits  . Neuropathy ([facility])   . Stroke [facility](HCC)     Patient Active Problem List   Diagnosis Date Noted  . Peripheral vascular disease, unspecified ([facility]) 04/27/2012  . Unilateral complete BKA ([facility]) 07/09/2011  . Gas gangrene ([facility]) 07/02/2011  . Sepsis(995.91) 07/02/2011  . Acute renal  failure ([facility]) 07/02/2011  . Anemia 11/26/2010  . Diabetes mellitus ([facility]) 11/22/2010  . Diabetic foot ulcer associated with type 2 diabetes mellitus ([facility])   . Hypertension   . Arthritis   . Neuropathy ([facility])   . Intracerebral hemorrhage [facility](HCC)     Past Surgical History:  Procedure Laterality Date  . AMPUTATION  11/24/2010   Procedure: AMPUTATION FOOT;  Surgeon: Toni ArthursJohn Hewitt, MD;  [facility];  Service: Orthopedics;  Laterality: Right;  Right  FOOT TRANS METATARSAL AMPUTATION  . AMPUTATION  07/02/2011   Procedure: AMPUTATION BELOW KNEE;  Surgeon: Toni ArthursJohn Hewitt, MD;  [facility];  Service: Orthopedics;  Laterality: Right;  . APPLICATION OF WOUND VAC  07/02/2011   Procedure: APPLICATION OF WOUND VAC;  Surgeon: Toni ArthursJohn Hewitt, MD;  [facility];  Service: Orthopedics;  Laterality: Right;  . BRAIN SURGERY  1980's   [place]  . CESAREAN SECTION    . I&D EXTREMITY  11/24/2010   Procedure: IRRIGATION AND DEBRIDEMENT EXTREMITY;  Surgeon: Toni ArthursJohn Hewitt, MD;  [facility];  Service: Orthopedics;  Laterality: Left;  Debriedment of left plantar ulcer and trimming of toenails  . I&D EXTREMITY  07/02/2011   Procedure: IRRIGATION AND DEBRIDEMENT EXTREMITY;  Surgeon: Toni ArthursJohn Hewitt, MD;  [facility];  Service: Orthopedics;  Laterality: Left;  I &  D of Left Foot  . I&D EXTREMITY  07/06/2011   Procedure: IRRIGATION AND DEBRIDEMENT EXTREMITY;  Surgeon: [name], MD;  [facility];  Service: Orthopedics;  Laterality: Right;  IRRIGATION/DEBRIDEMENT RIGHT BELOW KNEE AMPUTATION    OB History    No data available       Home Medications    Prior to Admission medications   Medication Sig Start Date End Date Taking? Authorizing Provider  amLODipine (NORVASC) 10 MG tablet Take 1 tablet (10 mg total) by mouth daily. 07/09/11 07/08/12  Leroy Sea, MD  aspirin EC 81 MG tablet Take 81 mg by mouth daily.    Historical Provider, MD  ferrous sulfate 325 (65 FE) MG tablet  Take 325 mg by mouth 2 (two) times daily with a meal.    Historical Provider, MD  insulin aspart (NOVOLOG) 100 UNIT/ML injection Inject 3 Units into the skin 3 (three) times daily with meals. 07/09/11 07/08/12  Leroy Sea, MD  insulin aspart (NOVOLOG) 100 UNIT/ML injection Before each meal 3 times a day, 140-199 - 2 units, 200-250 - 4 units, 251-299 - 6 units,  300-349 - 8 units,  350 or above 10 units. 07/09/11   Leroy Sea, MD  insulin glargine (LANTUS) 100 UNIT/ML injection Inject 5 Units into the skin at bedtime. 07/16/11   Jacquelynn Cree, PA-C    Family History Family History  Problem Relation Age of Onset  . Diabetes Mother   . Hypertension Mother   . Hyperlipidemia Mother   . Diabetes Father   . Cancer Father     prostate  . Hyperlipidemia Father   . Hypertension Father   . Diabetes Brother   . Peripheral Artery Disease Brother     has had toes amputated    Social History Social History  Substance Use Topics  . Smoking status: Never Smoker  . Smokeless tobacco: Never Used  . Alcohol use No     Allergies   Review of patient's allergies indicates no known allergies.   Review of Systems Review of Systems  All other systems reviewed and are negative.    Physical Exam Updated Vital Signs BP 129/62 (BP Location: Right Arm)   Pulse 79   Temp 97.4 F (36.3 C)   Resp 22   LMP  (LMP Unknown)   SpO2 100%   Physical Exam  Constitutional: She is oriented to person, place, and time. No distress.  Chronically ill appearing female, appears older than stated age.  HENT:  Head: Normocephalic and atraumatic.  Mouth/Throat: No oropharyngeal exudate.  Eyes: Conjunctivae and EOM are normal. Pupils are equal, round, and reactive to light. Right eye exhibits no discharge. Left eye exhibits no discharge. No scleral icterus.  Cardiovascular: Normal rate, regular rhythm, normal heart sounds and intact distal pulses.  Exam reveals no gallop and no friction rub.   No murmur  heard. Pulmonary/Chest: Effort normal and breath sounds normal. No respiratory distress. She has no wheezes. She has no rales. She exhibits no tenderness.  Abdominal: Soft. Bowel sounds are normal. She exhibits no distension. There is tenderness. There is no guarding.  Genitourinary: Rectal exam shows guaiac positive stool.  Genitourinary Comments: 2cm stage 1 decubitus ulcer adjacent to rectum. No gross blood on exam.  Musculoskeletal: Normal range of motion. She exhibits no edema.  Right BKA  Neurological: She is alert and oriented to person, place, and time.  Skin: Skin is warm and dry. Rash ( wet, erythematous rash under bilateral  breasts) noted. She is not diaphoretic. No erythema. No pallor.  Diffuse, dark, spots with pustules scattered across trunk and extremities.   Psychiatric: She has a normal mood and affect. Her behavior is normal.  Nursing note and vitals reviewed.    ED Treatments / Results  Labs (all labs ordered are listed, but only abnormal results are displayed) Labs Reviewed  URINALYSIS, ROUTINE W REFLEX MICROSCOPIC (NOT AT [facility]) - Abnormal; Notable for the following:       Result Value   APPearance TURBID (*)    Hgb urine dipstick MODERATE (*)    Protein, ur 100 (*)    Leukocytes, UA LARGE (*)    All other components within normal limits  BRAIN NATRIURETIC PEPTIDE - Abnormal; Notable for the following:    B Natriuretic Peptide 793.5 (*)    All other components within normal limits  CBC WITH DIFFERENTIAL/PLATELET - Abnormal; Notable for the following:    WBC 11.4 (*)    RBC 1.66 (*)    Hemoglobin 4.2 (*)    HCT 13.6 (*)    MCH 25.3 (*)    RDW 24.1 (*)    Platelets 10 (*)    Neutro Abs 9.2 (*)    All other components within normal limits  COMPREHENSIVE METABOLIC PANEL - Abnormal; Notable for the following:    Sodium 133 (*)    CO2 9 (*)    BUN 94 (*)    Creatinine, Ser 8.78 (*)    Calcium 7.2 (*)    Albumin 2.9 (*)    AST 12 (*)    ALT 10 (*)    GFR  calc non Af Amer 5 (*)    GFR calc Af Amer 5 (*)    All other components within normal limits  PROTIME-INR - Abnormal; Notable for the following:    Prothrombin Time 18.2 (*)    All other components within normal limits  LACTATE DEHYDROGENASE - Abnormal; Notable for the following:    LDH 204 (*)    All other components within normal limits  APTT - Abnormal; Notable for the following:    aPTT 41 (*)    All other components within normal limits  FIBRINOGEN - Abnormal; Notable for the following:    Fibrinogen 489 (*)    All other components within normal limits  D-DIMER, QUANTITATIVE (NOT AT [facility]) - Abnormal; Notable for the following:    D-Dimer, Quant 3.23 (*)    All other components within normal limits  URINE MICROSCOPIC-ADD ON - Abnormal; Notable for the following:    Squamous Epithelial / LPF 6-30 (*)    Bacteria, UA MANY (*)    All other components within normal limits  RETICULOCYTES - Abnormal; Notable for the following:    Retic Ct Pct 9.0 (*)    RBC. 1.76 (*)    All other components within normal limits  POC OCCULT BLOOD, ED - Abnormal; Notable for the following:    Fecal Occult Bld POSITIVE (*)    All other components within normal limits  URINE CULTURE  CBC WITH DIFFERENTIAL/PLATELET  HAPTOGLOBIN  MAGNESIUM  PHOSPHORUS  TYPE AND SCREEN  PREPARE RBC (CROSSMATCH)  PREPARE PLATELET PHERESIS  PREPARE RBC (CROSSMATCH)    EKG  EKG Interpretation  Date/Time:  Saturday August 23 2015 15:59:13 EDT Ventricular Rate:  78 PR Interval:    QRS Duration: 89 QT Interval:  425 QTC Calculation: 485 R Axis:   95 Text Interpretation:  Right and left arm electrode reversal, interpretation  assumes no reversal Sinus rhythm Probable lateral infarct, age indeterminate Confirmed by Juleen China  MD, STEPHEN [phone]) on 08/23/2015 4:30:50 PM       Radiology No results found.  Procedures Procedures (including critical care time)  CRITICAL CARE Performed by: Dub Mikes   Total critical care time: 60 minutes  Critical care time was exclusive of separately billable procedures and treating other patients.  Critical care was necessary to treat or prevent imminent or life-threatening deterioration.  Critical care was time spent personally by me on the following activities: development of treatment plan with patient and/or surrogate as well as nursing, discussions with consultants, evaluation of patient's response to treatment, examination of patient, obtaining history from patient or surrogate, ordering and performing treatments and interventions, ordering and review of laboratory studies, ordering and review of radiographic studies, pulse oximetry and re-evaluation of patient's condition.   Medications Ordered in ED Medications - No data to display   Initial Impression / Assessment and Plan / ED Course  I have reviewed the triage vital signs and the nursing notes.  Pertinent labs & imaging results that were available during my care of the patient were reviewed by me and considered in my medical decision making (see chart for details).  Clinical Course    50 y.o F with multiple co-morbidities presents to the ED today with multiple complaints. Pt has not seen a doctor or had medications in over 1 year. She appears chronically ill on exam. VSS. Hgb found to be 4, PLT 10. Unclear etiology, pt is A&O x 4 despite severe anemia.  Pt transfused 2 units of blood and 1 unit of platelets. Pt also in acute renal failure, Cr 8.78. Hemoccult positive. No abdominal pain. No gross blood on exam. UA appears to be infected. Pt given rocephin per pharmacy consult. Obvious candidal infection seen under breasts. Pt also has strange rash diffuse over body with dark pock marks, some of which are pustular. Pt states this has been present for years but some of this appears new, with new pustules. Mild leukocytosis and no fever. No sign of sepsis.  Hematology consulted. Spoke  with Dr. Truett Perna who recommends peripheral smear, retic count, LDH. Will order. He will consult pt in ED. Spoke with Dr. Melynda Ripple who will admit pt to step down.     Final Clinical Impressions(s) / ED Diagnoses   Final diagnoses:  Anemia, unspecified anemia type  Thrombocytopenia (HCC)  Acute renal failure, unspecified acute renal failure type (HCC)  Occult blood positive stool  Cough    New Prescriptions New Prescriptions   No medications on file     Dub Mikes, PA-C 08/23/15 1952    Raeford Razor, MD 08/24/15 1719

## 2015-08-23 NOTE — ED Notes (Signed)
Called main lab for add on. Told by lab to draw gold top.

## 2015-08-23 NOTE — ED Notes (Signed)
Lab called with HGB 4.4 and Platelets 10. Provider notified order to redraw all labs.

## 2015-08-23 NOTE — ED Notes (Addendum)
ICU MD at bedside

## 2015-08-23 NOTE — ED Notes (Signed)
Attempted second IV unable to obtain IV access. Patient stated hard to get IV for her.

## 2015-08-23 NOTE — ED Notes (Signed)
Pt transported to xray 

## 2015-08-23 NOTE — ED Notes (Signed)
Pt unable to receive IV antibiotics at this time due to blood administration. Pt only has 1 IV due to difficult IV access.

## 2015-08-23 NOTE — ED Notes (Signed)
Patient transported to X-ray 

## 2015-08-23 NOTE — Progress Notes (Signed)
Pharmacy note: rocephin  50 yo female with UTI to begin rocephin. Pharmacy consulted to dose. Urine culture pending  Plan -Rocephin 1gm IV q24h -No adjustments will be needed. Will sign off. Please contact pharmacy with any other needs.  Thank you

## 2015-08-23 NOTE — ED Notes (Signed)
Sent down blood specimen for CBC, CMP, called to add on BNP

## 2015-08-23 NOTE — ED Notes (Signed)
This Rn in room with Sam PA and Denyse Amassorey RN for rectal exam.

## 2015-08-23 NOTE — ED Notes (Signed)
15 minute post blood trasfusion admin. Pt tolerating well.

## 2015-08-23 NOTE — H&P (Addendum)
[facility] History and Physical  Cheryl HansenMichelle L Wolf NFA:[id number] DOB: 04/10/1965 DOA: 08/23/2015   PCP: Cain SaupeFULP, CAMMIE, MD   Chief Complaint: "I've just been weak."  HPI: [name] is a 50 y.o. female with past medical history significant for diabetes and hypertension presents emergency room chief complaint weakness. Patient states that over the last 4 week she's been becoming increasingly weak. She states that gradually she's also developed a feeling of lack of sensation in her extremities. This is specially gotten worse over the last week. Patient states that prior to this she didn't have any odd sensations of weakness or numbness. Patient states that things came to head when she got so weak the day of admission she could not get out of bed. For the last several days patient's children have helped her get around the house.  Patient complains of increasing anxiety over these issue. Positive for orthopnea. Denies sick contacts. Denies fever.  Patient does have a history of neuropathy. Due to cost she has not seen her physician in a year. She has also been off her medications for 1 year.  Review of Systems:  As per HPI otherwise 10 point review of systems negative.    Past Medical History:  Diagnosis Date  . Arthritis   . Diabetes mellitus   . Diabetic foot ulcer associated with type 2 diabetes mellitus (HCC)   . Hypertension   . Intracerebral hemorrhage (HCC) As a teenager    States she had burst blood vessel as teenager, now with resultant minor visual field and hearing deficits  . Neuropathy (HCC)   . Stroke [facility](HCC)    Past Surgical History:  Procedure Laterality Date  . AMPUTATION  11/24/2010   Procedure: AMPUTATION FOOT;  Surgeon: Toni ArthursJohn Hewitt, MD;  [facility];  Service: Orthopedics;  Laterality: Right;  Right  FOOT TRANS METATARSAL AMPUTATION  . AMPUTATION  07/02/2011   Procedure: AMPUTATION BELOW KNEE;  Surgeon: Toni ArthursJohn Hewitt, MD;  Location: [facility] OR;  Service:  Orthopedics;  Laterality: Right;  . APPLICATION OF WOUND VAC  07/02/2011   Procedure: APPLICATION OF WOUND VAC;  Surgeon: Toni ArthursJohn Hewitt, MD;  Location: [facility] OR;  Service: Orthopedics;  Laterality: Right;  . BRAIN SURGERY  1980's   [place]  . CESAREAN SECTION    . I&D EXTREMITY  11/24/2010   Procedure: IRRIGATION AND DEBRIDEMENT EXTREMITY;  Surgeon: Toni ArthursJohn Hewitt, MD;  Location: [facility] OR;  Service: Orthopedics;  Laterality: Left;  Debriedment of left plantar ulcer and trimming of toenails  . I&D EXTREMITY  07/02/2011   Procedure: IRRIGATION AND DEBRIDEMENT EXTREMITY;  Surgeon: Toni ArthursJohn Hewitt, MD;  Location: [facility] OR;  Service: Orthopedics;  Laterality: Left;  I & D of Left Foot  . I&D EXTREMITY  07/06/2011   Procedure: IRRIGATION AND DEBRIDEMENT EXTREMITY;  Surgeon: Toni ArthursJohn Hewitt, MD;  Location: [facility] OR;  Service: Orthopedics;  Laterality: Right;  IRRIGATION/DEBRIDEMENT RIGHT BELOW KNEE AMPUTATION   Social History:  reports that she has never smoked. She has never used smokeless tobacco. She reports that she does not drink alcohol or use drugs.  No Known Allergies  Family History  Problem Relation Age of Onset  . Diabetes Mother   . Hypertension Mother   . Hyperlipidemia Mother   . Diabetes Father   . Cancer Father     prostate  . Hyperlipidemia Father   . Hypertension Father   . Diabetes Brother   . Peripheral Artery Disease Brother     has  had toes amputated     Prior to Admission medications   Medication Sig Start Date End Date Taking? Authorizing Provider  aspirin 81 MG EC tablet Take 162-243 mg by mouth 3 (three) times daily as needed (chest pain). Swallow whole.   Yes Historical Provider, MD  Aspirin-Acetaminophen-Caffeine (GOODY HEADACHE PO) Take 1 packet by mouth 2 (two) times daily as needed (headache).   Yes Historical Provider, MD  Menthol, Topical Analgesic, (ICY HOT EX) Apply 1 application topically 2 (two) times daily as needed (pain).   Yes Historical  Provider, MD  Naphazoline HCl (CLEAR EYES OP) Place 1 drop into both eyes daily as needed (dry eyes/itching).   Yes Historical Provider, MD  naproxen sodium (ALEVE) 220 MG tablet Take 440 mg by mouth 2 (two) times daily as needed (pain).   Yes Historical Provider, MD  amLODipine (NORVASC) 10 MG tablet Take 1 tablet (10 mg total) by mouth daily. 07/09/11 07/08/12  Leroy SeaPrashant K Singh, MD  insulin aspart (NOVOLOG) 100 UNIT/ML injection Inject 3 Units into the skin 3 (three) times daily with meals. 07/09/11 07/08/12  Leroy SeaPrashant K Singh, MD  insulin aspart (NOVOLOG) 100 UNIT/ML injection Before each meal 3 times a day, 140-199 - 2 units, 200-250 - 4 units, 251-299 - 6 units,  300-349 - 8 units,  350 or above 10 units. Patient not taking: Reported on 08/23/2015 07/09/11   Leroy SeaPrashant K Singh, MD  insulin glargine (LANTUS) 100 UNIT/ML injection Inject 5 Units into the skin at bedtime. Patient not taking: Reported on 08/23/2015 07/16/11   Jacquelynn CreePamela S Love, PA-C   Physical Exam: Vitals:   08/23/15 1707 08/23/15 1738 08/23/15 1746 08/23/15 1819  BP: 142/73 133/68 140/64 125/60  Pulse: 82 81 79 80  Resp: 18 20 15 17   Temp: 97.3 F (36.3 C) 97.3 F (36.3 C)    TempSrc: Oral Oral    SpO2: 100% 100% 100% 100%    Wt Readings from Last 3 Encounters:  12/14/12 90.7 kg (200 lb)  04/27/12 90.7 kg (200 lb)  07/16/11 95.5 kg (210 lb 8.6 oz)    General:  appears ill and older than stated age Eyes:  PERRL, EOMI, normal lids, iris ENT:  grossly normal hearing, lips & tongue Neck:  no LAD, masses or thyromegaly Cardiovascular:  RRR, no m/r/g. 2+ nonpitting edema on L Respiratory:  CTA bilaterally, no w/r/r. Normal respiratory effort. Abdomen:  soft, ntnd Skin:   black skin lesions diffuse Musculoskeletal: grossly normal tone BUE, RLE amputation Psychiatric:  grossly normal very anxious and flat affect, speech fluent and appropriate, trouble with concentration Neurologic:  CN 2-12 grossly intact, moves all extremities in  coordinated fashion.          Labs on Admission:  Basic Metabolic Panel:  Recent Labs Lab 08/23/15 1407  NA 133*  K 4.3  CL 111  CO2 9*  GLUCOSE 93  BUN 94*  CREATININE 8.78*  CALCIUM 7.2*   Liver Function Tests:  Recent Labs Lab 08/23/15 1407  AST 12*  ALT 10*  ALKPHOS 74  BILITOT 1.0  PROT 7.0  ALBUMIN 2.9*   No results for input(s): LIPASE, AMYLASE in the last 168 hours. No results for input(s): AMMONIA in the last 168 hours. CBC:  Recent Labs Lab 08/23/15 1407  WBC 11.4*  NEUTROABS 9.2*  HGB 4.2*  HCT 13.6*  MCV 81.9  PLT 10*   Cardiac Enzymes: No results for input(s): CKTOTAL, CKMB, CKMBINDEX, TROPONINI in the last 168 hours.  BNP (last  3 results)  Recent Labs  08/23/15 1407  BNP 793.5*    ProBNP (last 3 results) No results for input(s): PROBNP in the last 8760 hours.   Creatinine clearance cannot be calculated (Unknown ideal weight.)  CBG: No results for input(s): GLUCAP in the last 168 hours.  Radiological Exams on Admission: No results found.  EKG: Independently reviewed. Ventricular rate 78, curettes duration 89, QTC 45; normal sinus rhythmflattened T-wave in lead 1 and flipped T in aVL, no ST elevation MI  Assessment/Plan Principal Problem:   Severe anemia Active Problems:   Hypertension   Diabetes mellitus (HCC)   Acute renal failure (HCC)   Thrombocytopenia (HCC)   Pressure ulcer   UTI (lower urinary tract infection)  Severe anemia, hemoglobin 4.2 on admission Hematology oncology consult it in the emergency room, waiting on recommendations 1u PRBC ordered by asked for 2u and EDP agreed 1 bag of plts ordered, platelets 10 on admission Retic count is 9-->responsive bone marrow Pos FOBT, will need scan of abdomen after well transfused followed by GI consult  LLE edema - pos d-dimer - LLE doppler --> leg swollen to knee & pt has been less ambulatory & multiple cell line abn could be due to mmalignancy - will need to be  optimized for filter placement if pos  UTI - started on rocephin - urine culture ordered  HTN - prn hydralazine  DM II - SSI - A1c & lipid panel ordered  AKI, Creatinine 8.7 on admission Baseline around 1.1 - renal labs in AM - strict I&Os - mag and Phos pending - likely cause of elevated BNP - renal U/S ordered - call nephro in AM  Anxiety - prn trazadone for sleep  Hypocalcemia Corrected calcium low, will recheck  in AM  Code Status: Full DVT Prophylaxis: Faythe Dingwall Family Communication: Mother at bedside Disposition Plan: Pending Improvement  SDU, Inpt  Haydee Salter, MD Family Medicine [facility] [email or web address] Password [email or web address]

## 2015-08-23 NOTE — ED Notes (Signed)
IV team at bedside 

## 2015-08-23 NOTE — Progress Notes (Signed)
Pharmacy Antibiotic Note  [name] is a 50 y.o. female admitted on 08/23/2015 with pain in hands/feet, rash on chest.  Pharmacy has been consulted for Levaquin and Fluconazole dosing She has profound anemia/thrombocytopenia. Hematology with concern for systemic infection vs. Hematopoietic malignancy. Pt in acute renal failure.   Plan: -Levaquin 500 mg IV q48h -Fluconazole 50 mg IV q24h -Trend WBC, temp, renal function  -F/U infectious/oncologic work-up -Low threshold to broaden anti-biotic coverage   Temp (24hrs), Avg:97.5 F (36.4 C), Min:97.3 F (36.3 C), Max:98.4 F (36.9 C)   Recent Labs Lab 08/23/15 1407  WBC 11.4*  CREATININE 8.78*    CrCl cannot be calculated (Unknown ideal weight.).    No Known Allergies  [name], [name] 08/23/2015 11:30 PM

## 2015-08-24 ENCOUNTER — Inpatient Hospital Stay (HOSPITAL_COMMUNITY): Payer: Medicaid Other

## 2015-08-24 DIAGNOSIS — D649 Anemia, unspecified: Secondary | ICD-10-CM

## 2015-08-24 DIAGNOSIS — R21 Rash and other nonspecific skin eruption: Secondary | ICD-10-CM

## 2015-08-24 DIAGNOSIS — R609 Edema, unspecified: Secondary | ICD-10-CM

## 2015-08-24 DIAGNOSIS — E119 Type 2 diabetes mellitus without complications: Secondary | ICD-10-CM

## 2015-08-24 DIAGNOSIS — L8990 Pressure ulcer of unspecified site, unspecified stage: Secondary | ICD-10-CM

## 2015-08-24 DIAGNOSIS — N1330 Unspecified hydronephrosis: Secondary | ICD-10-CM

## 2015-08-24 LAB — HAPTOGLOBIN: Haptoglobin: 153 mg/dL (ref 34–200)

## 2015-08-24 LAB — COMPREHENSIVE METABOLIC PANEL
ALT: 11 U/L — ABNORMAL LOW (ref 14–54)
AST: 13 U/L — ABNORMAL LOW (ref 15–41)
Albumin: 2.5 g/dL — ABNORMAL LOW (ref 3.5–5.0)
Alkaline Phosphatase: 70 U/L (ref 38–126)
Anion gap: 13 (ref 5–15)
BUN: 90 mg/dL — ABNORMAL HIGH (ref 6–20)
CO2: 9 mmol/L — ABNORMAL LOW (ref 22–32)
Calcium: 6.9 mg/dL — ABNORMAL LOW (ref 8.9–10.3)
Chloride: 116 mmol/L — ABNORMAL HIGH (ref 101–111)
Creatinine, Ser: 8.24 mg/dL — ABNORMAL HIGH (ref 0.44–1.00)
GFR calc Af Amer: 6 mL/min — ABNORMAL LOW (ref >60)
GFR calc non Af Amer: 5 mL/min — ABNORMAL LOW (ref >60)
Glucose, Bld: 110 mg/dL — ABNORMAL HIGH (ref 65–99)
Potassium: 4.2 mmol/L (ref 3.5–5.1)
Sodium: 138 mmol/L (ref 135–145)
Total Bilirubin: 0.8 mg/dL (ref 0.3–1.2)
Total Protein: 6.6 g/dL (ref 6.5–8.1)

## 2015-08-24 LAB — PROCALCITONIN: Procalcitonin: 0.21 ng/mL

## 2015-08-24 LAB — PROTIME-INR
INR: 1.56
Prothrombin Time: 18.9 seconds — ABNORMAL HIGH (ref 11.4–15.2)

## 2015-08-24 LAB — GLUCOSE, CAPILLARY
Glucose-Capillary: 104 mg/dL — ABNORMAL HIGH (ref 65–99)
Glucose-Capillary: 127 mg/dL — ABNORMAL HIGH (ref 65–99)
Glucose-Capillary: 74 mg/dL (ref 65–99)
Glucose-Capillary: 86 mg/dL (ref 65–99)
Glucose-Capillary: 89 mg/dL (ref 65–99)

## 2015-08-24 LAB — BLOOD GAS, VENOUS
Acid-base deficit: 18.7 mmol/L — ABNORMAL HIGH (ref 0.0–2.0)
Bicarbonate: 8.1 mEq/L — ABNORMAL LOW (ref 20.0–24.0)
O2 Saturation: 93.9 %
Patient temperature: 98.6
TCO2: 8.8 mmol/L (ref 0–100)
pCO2, Ven: 22.7 mmHg — ABNORMAL LOW (ref 45.0–50.0)
pH, Ven: 7.18 — CL (ref 7.250–7.300)
pO2, Ven: 73.9 mmHg — ABNORMAL HIGH (ref 31.0–45.0)

## 2015-08-24 LAB — URINE CULTURE

## 2015-08-24 LAB — LIPID PANEL
Cholesterol: 90 mg/dL (ref 0–200)
HDL: 28 mg/dL — ABNORMAL LOW (ref >40)
LDL Cholesterol: 41 mg/dL (ref 0–99)
Total CHOL/HDL Ratio: 3.2 RATIO
Triglycerides: 107 mg/dL (ref <150)
VLDL: 21 mg/dL (ref 0–40)

## 2015-08-24 LAB — CORTISOL: Cortisol, Plasma: 14.1 ug/dL

## 2015-08-24 LAB — PHOSPHORUS: Phosphorus: 7.6 mg/dL — ABNORMAL HIGH (ref 2.5–4.6)

## 2015-08-24 LAB — CK: Total CK: 269 U/L — ABNORMAL HIGH (ref 38–234)

## 2015-08-24 LAB — MRSA PCR SCREENING: MRSA by PCR: NEGATIVE

## 2015-08-24 LAB — TSH: TSH: 5.461 u[IU]/mL — ABNORMAL HIGH (ref 0.350–4.500)

## 2015-08-24 LAB — MAGNESIUM: Magnesium: 2 mg/dL (ref 1.7–2.4)

## 2015-08-24 LAB — C-REACTIVE PROTEIN: CRP: 5.1 mg/dL — ABNORMAL HIGH (ref <1.0)

## 2015-08-24 LAB — LACTIC ACID, PLASMA
Lactic Acid, Venous: 0.8 mmol/L (ref 0.5–1.9)
Lactic Acid, Venous: 1 mmol/L (ref 0.5–1.9)

## 2015-08-24 LAB — SEDIMENTATION RATE: Sed Rate: 108 mm/hr — ABNORMAL HIGH (ref 0–22)

## 2015-08-24 LAB — PREPARE RBC (CROSSMATCH)

## 2015-08-24 LAB — T4, FREE: Free T4: 0.79 ng/dL (ref 0.61–1.12)

## 2015-08-24 MED ORDER — ACETAMINOPHEN 650 MG RE SUPP: 650.0000 mg | Freq: Four times a day (QID) | RECTAL | Status: DC | PRN

## 2015-08-24 MED ORDER — ONDANSETRON HCL 4 MG/2ML IJ SOLN
4.0000 mg | Freq: Four times a day (QID) | INTRAMUSCULAR | Status: DC | PRN
  Administered 2015-08-25 – 2015-09-03 (×5): 4 mg via INTRAVENOUS
  Filled 2015-08-24 (×5): qty 2

## 2015-08-24 MED ORDER — HYDRALAZINE HCL 20 MG/ML IJ SOLN: 10.0000 mg | Freq: Three times a day (TID) | INTRAMUSCULAR | Status: DC | PRN

## 2015-08-24 MED ORDER — INSULIN ASPART 100 UNIT/ML ~~LOC~~ SOLN
0.0000 [IU] | Freq: Three times a day (TID) | SUBCUTANEOUS | Status: DC
  Administered 2015-08-24: 3 [IU] via SUBCUTANEOUS

## 2015-08-24 MED ORDER — HYDROCODONE-ACETAMINOPHEN 5-325 MG PO TABS
1.0000 | ORAL_TABLET | ORAL | Status: DC | PRN
  Administered 2015-08-24 – 2015-08-31 (×12): 1 via ORAL
  Filled 2015-08-24 (×12): qty 1

## 2015-08-24 MED ORDER — SODIUM CHLORIDE 0.9 % IV SOLN: Freq: Once | INTRAVENOUS | Status: AC

## 2015-08-24 MED ORDER — STERILE WATER FOR INJECTION IV SOLN
150.0000 meq | INTRAVENOUS | Status: DC
  Administered 2015-08-24 – 2015-08-26 (×5): 150 meq via INTRAVENOUS
  Filled 2015-08-24 (×16): qty 850

## 2015-08-24 MED ORDER — ONDANSETRON HCL 4 MG PO TABS: 4.0000 mg | ORAL_TABLET | Freq: Four times a day (QID) | ORAL | Status: DC | PRN

## 2015-08-24 MED ORDER — ACETAMINOPHEN 325 MG PO TABS
650.0000 mg | ORAL_TABLET | Freq: Four times a day (QID) | ORAL | Status: DC | PRN
  Administered 2015-09-01 – 2015-09-02 (×2): 650 mg via ORAL
  Filled 2015-08-24 (×3): qty 2

## 2015-08-24 MED ORDER — TRAZODONE HCL 50 MG PO TABS
25.0000 mg | ORAL_TABLET | Freq: Every evening | ORAL | Status: DC | PRN
  Administered 2015-08-25 – 2015-08-31 (×3): 25 mg via ORAL
  Filled 2015-08-24 (×3): qty 1

## 2015-08-24 NOTE — Progress Notes (Signed)
Patient ID: [name], female   DOB: 04/12/1965, 50 y.o.   MRN: [id number]  Pt was admitted earlier by Dr. Melynda RippleHobbs.  Dr. Truett PernaSherrill patients Oncologist contacted me with concerns that pt needed to be in ICU due to her hemoglobin and platelets.  I spoke to Dr. Jamison NeighborNestor and Critical Care came to see and admitted pt to ICU

## 2015-08-24 NOTE — Progress Notes (Signed)
eLink Physician-Brief Progress Note Patient Name: [name] DOB: 09/16/1965 MRN: [id number]   Date of Service  08/24/2015  HPI/Events of Note  Bedside nurse contacted me regarding persistent hematuria which she reports is significant and ongoing through the day. Previously received transfusion of platelets with platelet count 18,000. Hemodynamically stable otherwise.   eICU Interventions  Transfuse 1 unit of platelets         [name] 08/24/2015, 6:16 PM

## 2015-08-24 NOTE — Consult Note (Signed)
PULMONARY / CRITICAL CARE MEDICINE   Name: Cheryl HansenMichelle L Wolf MRN: [id number] DOB: 05/15/1965    ADMISSION DATE:  08/23/2015 CONSULTATION DATE:  08/23/15  REFERRING MD:  Dr. Melynda RippleHobbs  CHIEF COMPLAINT:  Anemia, AKI, metabolic acidosis  HISTORY OF PRESENT ILLNESS:   [name] is a 50 y/o woman with PMH listed below.  She has had worsening weakness over the last few months.  Over the last week this has progressed to the point where she is not able to stand on her own (she uses a walker due to BKA on the right).  She has also had loss of sensation in her hands and foot as well as loss of urinary continence. In the ED she was found to have a hemoglobin of 4.8 and platelets of 10, as well as AKI with Cr of 8.0.  Despite her low Hgb, her HR was in the 80s and her BP was 130-150 systolic.  She also recently noticed papules on her fingers, forarms and shins.  They are not painful or prurulent.  She also has developed an excoriated rash on her chest and upper abdomen.   PAST MEDICAL HISTORY :  She  has a past medical history of Arthritis; Diabetes mellitus; Diabetic foot ulcer associated with type 2 diabetes mellitus (HCC); Hypertension; Intracerebral hemorrhage (HCC) (As a teenager ); Neuropathy (HCC); and Stroke (HCC).  PAST SURGICAL HISTORY: She  has a past surgical history that includes Cesarean section; Amputation (11/24/2010); I&D extremity (11/24/2010); Amputation (07/02/2011); Application if wound vac (07/02/2011); I&D extremity (07/02/2011); I&D extremity (07/06/2011); and Brain surgery (1980's).  No Known Allergies  No current facility-administered medications on file prior to encounter.    Current Outpatient Prescriptions on File Prior to Encounter  Medication Sig  . amLODipine (NORVASC) 10 MG tablet Take 1 tablet (10 mg total) by mouth daily.  . insulin aspart (NOVOLOG) 100 UNIT/ML injection Inject 3 Units into the skin 3 (three) times daily with meals.  . insulin aspart (NOVOLOG) 100 UNIT/ML  injection Before each meal 3 times a day, 140-199 - 2 units, 200-250 - 4 units, 251-299 - 6 units,  300-349 - 8 units,  350 or above 10 units. (Patient not taking: Reported on 08/23/2015)  . insulin glargine (LANTUS) 100 UNIT/ML injection Inject 5 Units into the skin at bedtime. (Patient not taking: Reported on 08/23/2015)    FAMILY HISTORY:  Her indicated that her mother is alive. She indicated that her father is alive. She indicated that her brother is alive.  Has 2 children 10 and 13. No history of autoimmune disease  SOCIAL HISTORY: She  reports that she has never smoked. She has never used smokeless tobacco. She reports that she does not drink alcohol or use drugs.  REVIEW OF SYSTEMS:   A review of 14 systems was negative except as stated in the HPI.   SUBJECTIVE:  50 y/o woman with severe anemia, progressive weakness, loss of sen  VITAL SIGNS: BP (!) 127/54   Pulse 77   Temp 97.6 F (36.4 C)   Resp 18   LMP  (LMP Unknown)   SpO2 100%      INTAKE / OUTPUT: I/O last 3 completed shifts: In: 335 [Blood:335] Out: -   PHYSICAL EXAMINATION: General:  50 y/o woman lying on stretcher, no acute distress Neuro:  Alert and oriented x4,  HEENT:  PERRL, EOMI, OP clear, sclera and oral mucosa pale, echymosis on left buccal mucosa Cardiovascular:  NRRR, no MRG Lungs:  CTAB, no  wrr Abdomen:  Soft, NTND, +bowel sounds Musculoskeletal:  Right BKA, well healed.  4+ edema Skin:  Skin over and around left eye darkened and thickened.  Multiple raised, dark papules 0.5-1cm in diameter on extensor surfaces of digits, fore arms and shins.  Some with central ulceration.  Diffuse macular rash on chest and upper abdomen with excoriated skin.   LABS:  BMET  Recent Labs Lab 08/23/15 1407  NA 133*  K 4.3  CL 111  CO2 9*  BUN 94*  CREATININE 8.78*  GLUCOSE 93    Electrolytes  Recent Labs Lab 08/23/15 1407  CALCIUM 7.2*    CBC  Recent Labs Lab 08/23/15 1407  WBC 11.4*   HGB 4.2*  HCT 13.6*  PLT 10*    Coag's  Recent Labs Lab 08/23/15 1446  APTT 41*  INR 1.49    Sepsis Markers  Recent Labs Lab 08/24/15 0119  LATICACIDVEN 0.8  PROCALCITON 0.21    ABG No results for input(s): PHART, PCO2ART, PO2ART in the last 168 hours.  Liver Enzymes  Recent Labs Lab 08/23/15 1407  AST 12*  ALT 10*  ALKPHOS 74  BILITOT 1.0  ALBUMIN 2.9*    Cardiac Enzymes No results for input(s): TROPONINI, PROBNP in the last 168 hours.  Glucose  Recent Labs Lab 08/24/15 0056  GLUCAP 89    Imaging Dg Chest 2 View  Result Date: 08/23/2015 CLINICAL DATA:  Cough, generalized weakness EXAM: CHEST  2 VIEW COMPARISON:  07/07/2011 FINDINGS: Significant cardiomegaly is noted. No infiltrate or pulmonary edema. There is small left pleural effusion. Bilateral basilar atelectasis. IMPRESSION: Significant cardiomegaly. Small left pleural effusion. Bilateral basilar atelectasis. No infiltrate or convincing pulmonary edema. Electronically Signed   By: Natasha Mead M.D.   On: 08/23/2015 20:19   US Renal  Result Date: 08/24/2015 CLINICAL DATA:  Acute onset of renal insufficiency. Initial encounter. EXAM: RENAL / URINARY TRACT ULTRASOUND COMPLETE COMPARISON:  None. FINDINGS: Right Kidney: Length: 12.8 cm. Echogenicity within normal limits. Moderate right-sided hydronephrosis is noted. No mass visualized. Left Kidney: Length: 12.2 cm. Echogenicity within normal limits. Moderate left-sided hydronephrosis is noted. No mass visualized. Bladder: Diffusely distended; the patient is unable to void. IMPRESSION: Moderate bilateral hydronephrosis. The bladder is distended, and the patient is unable to void. This could reflect bladder outlet obstruction. Bilateral ureteral obstruction cannot be excluded on the basis of this study. Noncontrast CT could be considered for further evaluation, as deemed clinically appropriate. Electronically Signed   By: Roanna Raider M.D.   On: 08/24/2015  00:39     STUDIES:  Renal ultrasound - 8/20 - bilateral moderate hydronephrosis with distended bladder. Echo - pending LE dopple - pending  CULTURES: Blood,urine  ANTIBIOTICS: levoquin 08/23/15 Ceftriaxone x1 08/23/15 Fluconazole 08/23/15  SIGNIFICANT EVENTS:   LINES/TUBES:   DISCUSSION: 50 y/o woman with history of DM and progressive weakness for the last few months found to have severe anemia and thrombocytopenia as well as AKI.  I am concerned that this may be an autoimmune process vs. Malignancy - has been seen by hematology.  ASSESSMENT / PLAN:  PULMONARY A: Compensatory respiratory alkalosis P:   monitor  CARDIOVASCULAR A:  Cardiomegaly on CXR P:  Echo pending  RENAL A:   AKI P:   Renal u/s showed distended bladder with bilateral hydronephrosis Foley to be placed after platelets infused Nephrology consult  HEMATOLOGIC A:   Anemia and thrombocytopenia P:  Etiology unclear Transfuse 2 units PRBCs and 1 unit platelets Transfuse for Hgb <  7 and platelets less than 10K or active bleeding. Sed rate >100 Autoimmune studies pending Dr. Truett PernaSherrill following  INFECTIOUS A:   UTI, candidal rash P:   levoquin for complicated UTI Fluconazole for likely candidal rash Urine cx pending.   ENDOCRINE A:   DM   P:   CBGs, insulin  NEUROLOGIC A:   Weakness, urinary incontinence P:   Monitor for now, may need neurology consult, possible EMG  Derm A: Papular lesions on arms, hands and legs P: Consult derm for biopsy of lesions.    FAMILY  - Updates: family [place] in [place], were here on admission but left to go back home.   - Inter-disciplinary family meet or Palliative Care meeting due by:  8/26    Pulmonary and Critical Care Medicine [facility] Pager: [phone]  08/24/2015, 4:31 AM

## 2015-08-24 NOTE — Progress Notes (Signed)
eLink Physician-Brief Progress Note Patient Name: Cheryl HansenMichelle L Wolf DOB: 06/15/1965 MRN: [id number]   Date of Service  08/24/2015  HPI/Events of Note  Spoke with bedside nurse regarding concern over limited IV access. Patient with thrombocytopenia and platelet count of 10,000. Nurse preparing to start infusion of platelets and Then Pl., Foley catheter for decompression of hydronephrosis. Nurse reports patient is incontinent. Nurse also reports patient is interactive but has a somewhat altered affect.   eICU Interventions  1. Continue current plan of care 2. Continue with infusion of platelets 3. Plan for placement of Foley catheter after platelets are infused 4. Consider additional IV access depending upon need an clinical stability as well as platelet count 5. Adding serum C3 & C4 to workup.      Intervention Category Minor Interventions: Communication with other healthcare providers and/or family  Lawanda CousinsJennings Nestor 08/24/2015, 3:07 AM

## 2015-08-24 NOTE — Progress Notes (Signed)
IP PROGRESS NOTE  Subjective:   She reports pain associated with the anterior chest rash.  Objective: Vital signs in last 24 hours: Blood pressure 91/69, pulse 76, temperature 98 F (36.7 C), temperature source Oral, resp. rate 16, SpO2 100 %.  Intake/Output from previous day: 08/19 0701 - 08/20 0700 In: 2010 [I.V.:1050; Blood:860; IV Piggyback:100] Out: 1600 [Urine:1600]  Physical Exam:  HEENT: Ecchymosis at the left buccal mucosa, no thrush Lungs: Clear anteriorly Cardiac: Regular rate and rhythm Skin: Multiple crusted hyperpigmented lesions over the extremities, several pustular lesions over the face and neck. Multiple draining ulcers at the buttock/groin, ulcerated skin lesion at the upper outer left breast    Lab Results:  Recent Labs  08/23/15 1407  WBC 11.4*  HGB 4.2*  HCT 13.6*  PLT 10*    BMET  Recent Labs  08/23/15 1407  NA 133*  K 4.3  CL 111  CO2 9*  GLUCOSE 93  BUN 94*  CREATININE 8.78*  CALCIUM 7.2*    Studies/Results: Dg Chest 2 View  Result Date: 08/23/2015 CLINICAL DATA:  Cough, generalized weakness EXAM: CHEST  2 VIEW COMPARISON:  07/07/2011 FINDINGS: Significant cardiomegaly is noted. No infiltrate or pulmonary edema. There is small left pleural effusion. Bilateral basilar atelectasis. IMPRESSION: Significant cardiomegaly. Small left pleural effusion. Bilateral basilar atelectasis. No infiltrate or convincing pulmonary edema. Electronically Signed   By: Lahoma Crocker M.D.   On: 08/23/2015 20:19   US Renal  Result Date: 08/24/2015 CLINICAL DATA:  Acute onset of renal insufficiency. Initial encounter. EXAM: RENAL / URINARY TRACT ULTRASOUND COMPLETE COMPARISON:  None. FINDINGS: Right Kidney: Length: 12.8 cm. Echogenicity within normal limits. Moderate right-sided hydronephrosis is noted. No mass visualized. Left Kidney: Length: 12.2 cm. Echogenicity within normal limits. Moderate left-sided hydronephrosis is noted. No mass visualized. Bladder:  Diffusely distended; the patient is unable to void. IMPRESSION: Moderate bilateral hydronephrosis. The bladder is distended, and the patient is unable to void. This could reflect bladder outlet obstruction. Bilateral ureteral obstruction cannot be excluded on the basis of this study. Noncontrast CT could be considered for further evaluation, as deemed clinically appropriate. Electronically Signed   By: Garald Balding M.D.   On: 08/24/2015 00:39    Medications: I have reviewed the patient's current medications.  Assessment/Plan:  1. Severe anemia and thrombocytopenia 2. Renal failure/bilateral hydronephrosis 3. Pustular/ulcerated skin lesions/decubitus ulcers 4. Yeast rash beneath the breasts and in the groin 5. Diabetes 6. Status post right below-knee amputation in 2012 7. History of "neuropathy "  She appears stable. The CBC and chemistry panel are pending this morning.  Recommendations: 1. Biopsy/culture of a skin lesion 2. Schedule bone marrow biopsy for 08/25/2015 3. Nephrology consult today 4. Follow-up CBC, transfuse platelets for a count of less than 10,000   LOS: 1 day   Betsy Coder, MD   08/24/2015, 8:11 AM

## 2015-08-24 NOTE — Consult Note (Signed)
Referring Provider: No ref. provider found Primary Care Physician:  Cain SaupeFULP, CAMMIE, MD Primary Nephrologist:   Reason for Consultation:   Acute renal failure,  metabolic acidosis and anemia  HPI:  This is a delightful lady with renal failure and history of diabetes and previous BKA. She has had worsening in mobility of the last several weeks and transfers from bed to wheelchair. She takes care of two young children and is usually fairly independent. She states she noticed worsening health issues in the summer at the beginning when the children were out of school. She has no specific complaints but has noticed pustular lesions on her lower extremities and upper extremities and face. She states she feels swollen but denies any loss of appetite. She states that she has lost sensation in her hands and legs.  Past Medical History:  Diagnosis Date  . Arthritis   . Diabetes mellitus   . Diabetic foot ulcer associated with type 2 diabetes mellitus (HCC)   . Hypertension   . Intracerebral hemorrhage (HCC) As a teenager    States she had burst blood vessel as teenager, now with resultant minor visual field and hearing deficits  . Neuropathy (HCC)   . Stroke [facility](HCC)     Past Surgical History:  Procedure Laterality Date  . AMPUTATION  11/24/2010   Procedure: AMPUTATION FOOT;  Surgeon: Toni ArthursJohn Hewitt, MD;  [facility];  Service: Orthopedics;  Laterality: Right;  Right  FOOT TRANS METATARSAL AMPUTATION  . AMPUTATION  07/02/2011   Procedure: AMPUTATION BELOW KNEE;  Surgeon: Toni ArthursJohn Hewitt, MD;  Location: [facility] OR;  Service: Orthopedics;  Laterality: Right;  . APPLICATION OF WOUND VAC  07/02/2011   Procedure: APPLICATION OF WOUND VAC;  Surgeon: Toni ArthursJohn Hewitt, MD;  Location: [facility] OR;  Service: Orthopedics;  Laterality: Right;  . BRAIN SURGERY  1980's   [place]  . CESAREAN SECTION    . I&D EXTREMITY  11/24/2010   Procedure: IRRIGATION AND DEBRIDEMENT EXTREMITY;  Surgeon: Toni ArthursJohn Hewitt,  MD;  Location: [facility] OR;  Service: Orthopedics;  Laterality: Left;  Debriedment of left plantar ulcer and trimming of toenails  . I&D EXTREMITY  07/02/2011   Procedure: IRRIGATION AND DEBRIDEMENT EXTREMITY;  Surgeon: Toni ArthursJohn Hewitt, MD;  Location: [facility] OR;  Service: Orthopedics;  Laterality: Left;  I & D of Left Foot  . I&D EXTREMITY  07/06/2011   Procedure: IRRIGATION AND DEBRIDEMENT EXTREMITY;  Surgeon: Toni ArthursJohn Hewitt, MD;  Location: [facility];  Service: Orthopedics;  Laterality: Right;  IRRIGATION/DEBRIDEMENT RIGHT BELOW KNEE AMPUTATION    Prior to Admission medications   Medication Sig Start Date End Date Taking? Authorizing Provider  aspirin 81 MG EC tablet Take 162-243 mg by mouth 3 (three) times daily as needed (chest pain). Swallow whole.   Yes Historical Provider, MD  Aspirin-Acetaminophen-Caffeine (GOODY HEADACHE PO) Take 1 packet by mouth 2 (two) times daily as needed (headache).   Yes Historical Provider, MD  Menthol, Topical Analgesic, (ICY HOT EX) Apply 1 application topically 2 (two) times daily as needed (pain).   Yes Historical Provider, MD  Naphazoline HCl (CLEAR EYES OP) Place 1 drop into both eyes daily as needed (dry eyes/itching).   Yes Historical Provider, MD  naproxen sodium (ALEVE) 220 MG tablet Take 440 mg by mouth 2 (two) times daily as needed (pain).   Yes Historical Provider, MD  amLODipine (NORVASC) 10 MG tablet Take 1 tablet (10 mg total) by mouth daily. 07/09/11 07/08/12  Leroy SeaPrashant K Singh, MD  insulin  aspart (NOVOLOG) 100 UNIT/ML injection Inject 3 Units into the skin 3 (three) times daily with meals. 07/09/11 07/08/12  Leroy Sea, MD  insulin aspart (NOVOLOG) 100 UNIT/ML injection Before each meal 3 times a day, 140-199 - 2 units, 200-250 - 4 units, 251-299 - 6 units,  300-349 - 8 units,  350 or above 10 units. Patient not taking: Reported on 08/23/2015 07/09/11   Leroy Sea, MD  insulin glargine (LANTUS) 100 UNIT/ML injection Inject 5 Units into the skin at bedtime. Patient not  taking: Reported on 08/23/2015 07/16/11   Evlyn Kanner Love, PA-C    Current Facility-Administered Medications  Medication Dose Route Frequency Provider Last Rate Last Dose  . 0.9 %  sodium chloride infusion  250 mL Intravenous PRN Carolan Clines, MD      . acetaminophen (TYLENOL) tablet 650 mg  650 mg Oral Q6H PRN Haydee Salter, MD       Or  . acetaminophen (TYLENOL) suppository 650 mg  650 mg Rectal Q6H PRN Haydee Salter, MD      . fluconazole (DIFLUCAN) IVPB 50 mg  50 mg Intravenous Q24H Stevphen Rochester, RPH   50 mg at 08/24/15 0120  . hydrALAZINE (APRESOLINE) injection 10 mg  10 mg Intravenous Q8H PRN Haydee Salter, MD      . HYDROcodone-acetaminophen (NORCO/VICODIN) 5-325 MG per tablet 1 tablet  1 tablet Oral Q4H PRN Ladene Artist, MD   1 tablet at 08/24/15 [phone]  . insulin aspart (novoLOG) injection 0-20 Units  0-20 Units Subcutaneous TID WC Haydee Salter, MD      . levofloxacin [facility]) IVPB 500 mg  500 mg Intravenous Q48H Stevphen Rochester, RPH   500 mg at 08/24/15 0146  . ondansetron (ZOFRAN) tablet 4 mg  4 mg Oral Q6H PRN Haydee Salter, MD       Or  . ondansetron [facility]) injection 4 mg  4 mg Intravenous Q6H PRN Haydee Salter, MD      . sodium bicarbonate 150 mEq in sterile water 1,000 mL infusion  3 ampule Intravenous Continuous Elvis Coil, MD      . traZODone (DESYREL) tablet 25 mg  25 mg Oral QHS PRN Haydee Salter, MD        Allergies as of 08/23/2015  . (No Known Allergies)    Family History  Problem Relation Age of Onset  . Diabetes Mother   . Hypertension Mother   . Hyperlipidemia Mother   . Diabetes Father   . Cancer Father     prostate  . Hyperlipidemia Father   . Hypertension Father   . Diabetes Brother   . Peripheral Artery Disease Brother     has had toes amputated    Social History   Social History  . Marital status: Married    Spouse name: N/A  . Number of children: N/A  . Years of education: N/A   Occupational History  . Not on  file.   Social History Main Topics  . Smoking status: Never Smoker  . Smokeless tobacco: Never Used  . Alcohol use No  . Drug use: No  . Sexual activity: Not on file   Other Topics Concern  . Not on file   Social History Narrative   Has a son and daughter     Review of Systems: Gen: fatigue and weakness noted HEENT: No visual complaints, No history of Retinopathy. Normal external appearance No Epistaxis or Sore throat. No  sinusitis.   CV: Denies chest pain, angina, palpitations, syncope, orthopnea, PND, peripheral edema, and claudication. Resp: Denies dyspnea at rest, dyspnea with exercise, cough, sputum, wheezing, coughing up blood, and pleurisy. GI: Denies vomiting blood, jaundice, and fecal incontinence.   Denies dysphagia or odynophagia. GU : Denies urinary burning, blood in urine, urinary frequency, urinary hesitancy, nocturnal urination, and urinary incontinence.  No renal calculi. MS: Denies joint pain, limitation of movement, and swelling, stiffness, low back pain, extremity pain. Denies muscle weakness, cramps, atrophy.  No use of non steroidal antiinflammatory drugs. Derm:  Purulent skin lesions Psych: Denies depression, anxiety, memory loss, suicidal ideation, hallucinations, paranoia, and confusion. Heme: Positive for bruising, bleeding Neuro:  Weakness and neuropathic sensory loss  Endocrine  DM.  No Thyroid disease.  No Adrenal disease.  Physical Exam: Vital signs in last 24 hours: Temp:  [97.3 F (36.3 C)-98.4 F (36.9 C)] 97.7 F (36.5 C) (08/20 0840) Pulse Rate:  [73-118] 75 (08/20 0900) Resp:  [12-22] 15 (08/20 0900) BP: (91-167)/(36-109) 118/59 (08/20 0900) SpO2:  [95 %-100 %] 100 % (08/20 0900) Last BM Date: 08/21/15  Head:  Normocephalic and atraumatic. Eyes:  Sclera clear, no icterus.   Conjunctiva pink. Ears:  Normal auditory acuity. Nose:  No deformity, discharge,  or lesions. Mouth:  No deformity or lesions, dentition normal. Neck:  Supple; no  masses or thyromegaly. JVP not elevated Lungs:  Clear throughout to auscultation.   No wheezes, crackles, or rhonchi. No acute distress. Heart:  Regular rate and rhythm; no murmurs, clicks, rubs,  or gallops. Abdomen:  Soft, nontender and nondistended. No masses, hepatosplenomegaly or hernias noted. Normal bowel sounds, without guarding, and without rebound.   Pulses:  No carotid, renal, femoral bruits. DP and PT symmetrical and equal Extremities:  Right BKA, well healed.  4+ edema Neurologic:  Alert and  oriented x4;  grossly normal neurologically. Skin:Skin:  Skin over and around left eye darkened and thickened.  Multiple raised, dark papules 0.5-1cm in diameter on extensor surfaces of digits, fore arms and shins.  Some with central ulceration.  Diffuse macular rash on chest and upper abdomen with excoriated skin.     Intake/Output from previous day: 08/19 0701 - 08/20 0700 In: 2010 [I.V.:1050; Blood:860; IV Piggyback:100] Out: 1600 [Urine:1600] Intake/Output this shift: Total I/O In: 800 [P.O.:500; I.V.:300] Out: 650 [Urine:650]  Lab Results:  Recent Labs  08/23/15 1407  WBC 11.4*  HGB 4.2*  HCT 13.6*  PLT 10*   BMET  Recent Labs  08/23/15 1407  NA 133*  K 4.3  CL 111  CO2 9*  GLUCOSE 93  BUN 94*  CREATININE 8.78*  CALCIUM 7.2*   LFT  Recent Labs  08/23/15 1407  PROT 7.0  ALBUMIN 2.9*  AST 12*  ALT 10*  ALKPHOS 74  BILITOT 1.0   PT/INR  Recent Labs  08/23/15 1446  LABPROT 18.2*  INR 1.49   Hepatitis Panel No results for input(s): HEPBSAG, HCVAB, HEPAIGM, HEPBIGM in the last 72 hours.  Studies/Results: Dg Chest 2 View  Result Date: 08/23/2015 CLINICAL DATA:  Cough, generalized weakness EXAM: CHEST  2 VIEW COMPARISON:  07/07/2011 FINDINGS: Significant cardiomegaly is noted. No infiltrate or pulmonary edema. There is small left pleural effusion. Bilateral basilar atelectasis. IMPRESSION: Significant cardiomegaly. Small left pleural effusion.  Bilateral basilar atelectasis. No infiltrate or convincing pulmonary edema. Electronically Signed   By: Natasha Mead M.D.   On: 08/23/2015 20:19   US Renal  Result Date: 08/24/2015 CLINICAL DATA:  Acute onset of renal insufficiency. Initial encounter. EXAM: RENAL / URINARY TRACT ULTRASOUND COMPLETE COMPARISON:  None. FINDINGS: Right Kidney: Length: 12.8 cm. Echogenicity within normal limits. Moderate right-sided hydronephrosis is noted. No mass visualized. Left Kidney: Length: 12.2 cm. Echogenicity within normal limits. Moderate left-sided hydronephrosis is noted. No mass visualized. Bladder: Diffusely distended; the patient is unable to void. IMPRESSION: Moderate bilateral hydronephrosis. The bladder is distended, and the patient is unable to void. This could reflect bladder outlet obstruction. Bilateral ureteral obstruction cannot be excluded on the basis of this study. Noncontrast CT could be considered for further evaluation, as deemed clinically appropriate. Electronically Signed   By: Roanna RaiderJeffery  Chang M.D.   On: 08/24/2015 00:39    Assessment/Plan: Acute renal failure  most likely acute has normal size kidneys on ultrasound. No schistocytes on smear and therefore I do not think this is TTP and needing pheresis. Will continue hydration at this point . This could be sepsis from purulent skin lesions Metabolic acidosis, likely due to ARF. Serological work up in place. Myeloma being considered. Foley placed and gross blood due to severe thrombocytopenia Non oliguric   Metabolic Acidosis Change IV fluids to  IV bicarbonate   Anemia and thrombocytopenia Etiology unclear Transfuse 2 units PRBCs and 1 unit platelets Transfuse for Hgb <7 and platelets less than 10K or active bleeding. Autoimmune studies pending Dr. Truett PernaSherrill following  INFECTIOUS  UTI, candidal rash levoquin for complicated UTI Fluconazole for likely candidal rash Urine cx pending.   ENDOCRINE  DM   CBGs,  insulin  NEUROLOGIC  Weakness, urinary incontinence Monitor for now, may need neurology consult, possible EMG  Derm Papular lesions on arms, hands and legs Consult derm for biopsy of lesions. FAMILY  - Updates: family [place] in [place], were here on admission but left to go back home.   - Inter-disciplinary family meet or Palliative Care meeting due by:  8/26   LOS: 1 Tayen Narang,[place] W @TODAY @9 :58 AM

## 2015-08-24 NOTE — Progress Notes (Signed)
*  PRELIMINARY RESULTS* Vascular Ultrasound Left lower extremity venous duplex has been completed.  Preliminary findings: no evidence of DVT.  Farrel DemarkJill Eunice, RDMS, RVT  08/24/2015, 3:11 PM

## 2015-08-24 NOTE — Progress Notes (Addendum)
08/24/2015 HGB and PLT count called to Dr. Delton CoombesByrum with [facility]. Telephone orders to transfuse 2 more units PRBC. Orders enacted. Pt. Updated on plan of care. Will continue to closely monitor patient.  Shiv Shuey, Blanchard KelchStephanie Ingold

## 2015-08-24 NOTE — Progress Notes (Signed)
PULMONARY / CRITICAL CARE MEDICINE   Name: [name] MRN: [id number] DOB: 1965-09-22    ADMISSION DATE:  08/23/2015 CONSULTATION DATE:  08/23/15  REFERRING MD:  Dr. Aggie Moats  CHIEF COMPLAINT:  Anemia, AKI, metabolic acidosis  HISTORY OF PRESENT ILLNESS:   [name] is a 50 y/o woman with PMH listed below.  She has had worsening weakness over the last few months.  Over the last week this has progressed to the point where she is not able to stand on her own (she uses a walker due to BKA on the right).  She has also had loss of sensation in her hands and foot as well as loss of urinary continence. In the ED she was found to have a hemoglobin of 4.8 and platelets of 10, as well as AKI with Cr of 8.0.  Despite her low Hgb, her HR was in the 80s and her BP was 546-270 systolic.  She also recently noticed papules on her fingers, forarms and shins.  They are not painful or prurulent.  She also has developed an excoriated rash on her chest and upper abdomen.    SUBJECTIVE:  Gross hematuria after foley placed  VITAL SIGNS: BP (!) 143/107 (BP Location: Right Arm)   Pulse 76   Temp 97.7 F (36.5 C) (Oral)   Resp 19   LMP  (LMP Unknown)   SpO2 100%      INTAKE / OUTPUT: I/O last 3 completed shifts: In: 2010 [I.V.:1050; Blood:860; IV Piggyback:100] Out: 1600 [Urine:1600]  PHYSICAL EXAMINATION: General:  50 y/o woman, no acute distress Neuro:  Alert and oriented x4,  HEENT:  PERRL, EOMI, OP clear, sclera and oral mucosa pale, echymosis on left buccal mucosa Cardiovascular:  NRRR, no MRG Lungs:  CTAB, no wrr Abdomen:  Soft, NTND, +bowel sounds Musculoskeletal:  Right BKA, well healed.  4+ edema Skin:  Skin over and around left eye darkened and thickened.  Multiple raised, dark papules 0.5-1cm in diameter on extensor surfaces of digits, fore arms and shins.  Some with central ulceration.  Diffuse macular rash on chest and upper abdomen with excoriated skin.   LABS:  BMET  Recent  Labs Lab 08/23/15 1407  NA 133*  K 4.3  CL 111  CO2 9*  BUN 94*  CREATININE 8.78*  GLUCOSE 93    Electrolytes  Recent Labs Lab 08/23/15 1407  CALCIUM 7.2*    CBC  Recent Labs Lab 08/23/15 1407  WBC 11.4*  HGB 4.2*  HCT 13.6*  PLT 10*    Coag's  Recent Labs Lab 08/23/15 1446  APTT 41*  INR 1.49    Sepsis Markers  Recent Labs Lab 08/24/15 0119  LATICACIDVEN 0.8  PROCALCITON 0.21    ABG No results for input(s): PHART, PCO2ART, PO2ART in the last 168 hours.  Liver Enzymes  Recent Labs Lab 08/23/15 1407  AST 12*  ALT 10*  ALKPHOS 74  BILITOT 1.0  ALBUMIN 2.9*    Cardiac Enzymes No results for input(s): TROPONINI, PROBNP in the last 168 hours.  Glucose  Recent Labs Lab 08/24/15 0056 08/24/15 0835  GLUCAP 89 104*    Imaging Dg Chest 2 View  Result Date: 08/23/2015 CLINICAL DATA:  Cough, generalized weakness EXAM: CHEST  2 VIEW COMPARISON:  07/07/2011 FINDINGS: Significant cardiomegaly is noted. No infiltrate or pulmonary edema. There is small left pleural effusion. Bilateral basilar atelectasis. IMPRESSION: Significant cardiomegaly. Small left pleural effusion. Bilateral basilar atelectasis. No infiltrate or convincing pulmonary edema. Electronically Signed  By: Lahoma Crocker M.D.   On: 08/23/2015 20:19   US Renal  Result Date: 08/24/2015 CLINICAL DATA:  Acute onset of renal insufficiency. Initial encounter. EXAM: RENAL / URINARY TRACT ULTRASOUND COMPLETE COMPARISON:  None. FINDINGS: Right Kidney: Length: 12.8 cm. Echogenicity within normal limits. Moderate right-sided hydronephrosis is noted. No mass visualized. Left Kidney: Length: 12.2 cm. Echogenicity within normal limits. Moderate left-sided hydronephrosis is noted. No mass visualized. Bladder: Diffusely distended; the patient is unable to void. IMPRESSION: Moderate bilateral hydronephrosis. The bladder is distended, and the patient is unable to void. This could reflect bladder outlet  obstruction. Bilateral ureteral obstruction cannot be excluded on the basis of this study. Noncontrast CT could be considered for further evaluation, as deemed clinically appropriate. Electronically Signed   By: Garald Balding M.D.   On: 08/24/2015 00:39     STUDIES:  Renal ultrasound - 8/20 - bilateral moderate hydronephrosis with distended bladder. Echo - pending LE doppler - pending  CRP >> 5.1 ESR >> 108 Complements C3 C4 >>  IgG >>  SSA >>  SSB >>  A-Smith Ab >>  A-Jo Ab >>  ANA >>  Extractable nuclear Ag >>  Aldolase >>  SPEP >>   CULTURES: Blood 8/19 >>  Urine 8/19 >>   ANTIBIOTICS: levoquin 08/23/15 >>  Ceftriaxone x1 08/23/15 Fluconazole 08/23/15 >>   SIGNIFICANT EVENTS:   LINES/TUBES:   DISCUSSION: 50 y/o woman with history of DM and progressive weakness for the last few months found to have severe anemia and thrombocytopenia, Acute renal failure. Suspect autoimmune process (SLE) vs. Malignancy - has been seen by hematology. Dr [name] consulted nephrology today.   ASSESSMENT / PLAN:  PULMONARY A: Compensatory respiratory alkalosis P:   monitor  CARDIOVASCULAR A:  Cardiomegaly on CXR P:  Echo pending  RENAL A:   Acute renal failure Metabolic acidosis, likely due to ARF P:   Renal u/s showed distended bladder with bilateral hydronephrosis Foley to be placed after platelets infused Nephrology consult  Autoimmune labs sent, following bx skin rash requested   HEMATOLOGIC A:   Anemia and thrombocytopenia P:  Etiology unclear Transfuse 2 units PRBCs and 1 unit platelets Transfuse for Hgb <7 and platelets less than 10K or active bleeding. Autoimmune studies pending Dr. [name] following  INFECTIOUS A:   UTI, candidal rash P:   levoquin for complicated UTI Fluconazole for likely candidal rash Urine cx pending.   ENDOCRINE A:   DM   P:   CBGs, insulin  NEUROLOGIC A:   Weakness, urinary incontinence P:   Monitor for now, may  need neurology consult, possible EMG  Derm A: Papular lesions on arms, hands and legs P: Consult derm for biopsy of lesions.    FAMILY  - Updates: family is in [place], were here on admission but left to go back home.   - Inter-disciplinary family meet or Palliative Care meeting due by:  8/26   Independent CC time 25 minutes  Baltazar Apo, MD, PhD 08/24/2015, 9:51 AM [facility] [phone] or if no answer [phone]

## 2015-08-24 NOTE — Progress Notes (Signed)
08/24/2015 0845 Phlebotomy notified of pending lab work still needing collection. Will continue to monitor patient.  Bluma Buresh, Blanchard KelchStephanie Ingold

## 2015-08-24 NOTE — Progress Notes (Addendum)
08/24/2015 6:13 PM Nursing note Dr. Jamison NeighborNestor paged and made aware of progressively worsening hematuria throughout shift. Pt. Asymptomatic, VSS. Labs reviewed with MD. MD to place order for 1 unit Platelets. Pt. Updated on plan of care. Will continue to closely monitor patient.  [place], Blanchard KelchStephanie Ingold

## 2015-08-24 NOTE — Consult Note (Signed)
Chief Complaint: Patient was seen in consultation today for CT guided bone marrow biopsy Chief Complaint  Patient presents with  . Hand Pain  . Medication Refill  . Nausea  . Rash    Referring Physician(s): Sherrill,B  Supervising Physician: Corrie Mckusick  Patient Status: Inpatient  History of Present Illness: [name] is a 50 y.o. female with PMH as listed below who was recently admitted to the hospital with 1 week hx of generalized weakness, neuropathy, dyspnea. She was subsequently found to be severely anemic/thrombocytopenic and with renal failure/bilateral hydronephrosis. She has hematuria as well as positive stool hemoccult. She also has scattered hyperpigmented pustular/ulcerated skin lesions as well as yeast appearing rash beneath breasts/groin region. Cultures pending. She was seen by oncology and request now received for CT guided bone marrow biopsy for further evaluation.   Past Medical History:  Diagnosis Date  . Arthritis   . Diabetes mellitus   . Diabetic foot ulcer associated with type 2 diabetes mellitus ([place])   . Hypertension   . Intracerebral hemorrhage ([place]) As a teenager    States she had burst blood vessel as teenager, now with resultant minor visual field and hearing deficits  . Neuropathy ([place])   . Stroke [facility])     Past Surgical History:  Procedure Laterality Date  . AMPUTATION  11/24/2010   Procedure: AMPUTATION FOOT;  Surgeon: Wylene Simmer, MD;  Location: [place];  Service: Orthopedics;  Laterality: Right;  Right  FOOT TRANS METATARSAL AMPUTATION  . AMPUTATION  07/02/2011   Procedure: AMPUTATION BELOW KNEE;  Surgeon: Wylene Simmer, MD;  Location: [place];  Service: Orthopedics;  Laterality: Right;  . APPLICATION OF WOUND VAC  07/02/2011   Procedure: APPLICATION OF WOUND VAC;  Surgeon: Wylene Simmer, MD;  Location: [place];  Service: Orthopedics;  Laterality: Right;  . BRAIN SURGERY  1980's   Previous brain surgery in [place], [place]  . CESAREAN  SECTION    . I&D EXTREMITY  11/24/2010   Procedure: IRRIGATION AND DEBRIDEMENT EXTREMITY;  Surgeon: Wylene Simmer, MD;  Location: [place];  Service: Orthopedics;  Laterality: Left;  Debriedment of left plantar ulcer and trimming of toenails  . I&D EXTREMITY  07/02/2011   Procedure: IRRIGATION AND DEBRIDEMENT EXTREMITY;  Surgeon: Wylene Simmer, MD;  Location: [place];  Service: Orthopedics;  Laterality: Left;  I & D of Left Foot  . I&D EXTREMITY  07/06/2011   Procedure: IRRIGATION AND DEBRIDEMENT EXTREMITY;  Surgeon: Wylene Simmer, MD;  Location: [place];  Service: Orthopedics;  Laterality: Right;  IRRIGATION/DEBRIDEMENT RIGHT BELOW KNEE AMPUTATION    Allergies: Review of patient's allergies indicates no known allergies.  Medications: Prior to Admission medications   Medication Sig Start Date End Date Taking? Authorizing Provider  aspirin 81 MG EC tablet Take 162-243 mg by mouth 3 (three) times daily as needed (chest pain). Swallow whole.   Yes Historical Provider, MD  Aspirin-Acetaminophen-Caffeine (GOODY HEADACHE PO) Take 1 packet by mouth 2 (two) times daily as needed (headache).   Yes Historical Provider, MD  Menthol, Topical Analgesic, (ICY HOT EX) Apply 1 application topically 2 (two) times daily as needed (pain).   Yes Historical Provider, MD  Naphazoline HCl (CLEAR EYES OP) Place 1 drop into both eyes daily as needed (dry eyes/itching).   Yes Historical Provider, MD  naproxen sodium (ALEVE) 220 MG tablet Take 440 mg by mouth 2 (two) times daily as needed (pain).   Yes Historical Provider, MD  amLODipine (NORVASC) 10 MG tablet  Take 1 tablet (10 mg total) by mouth daily. 07/09/11 07/08/12  Thurnell Lose, MD  insulin aspart (NOVOLOG) 100 UNIT/ML injection Inject 3 Units into the skin 3 (three) times daily with meals. 07/09/11 07/08/12  Thurnell Lose, MD  insulin aspart (NOVOLOG) 100 UNIT/ML injection Before each meal 3 times a day, 140-199 - 2 units, 200-250 - 4 units, 251-299 - 6 units,  300-349 - 8 units,   350 or above 10 units. Patient not taking: Reported on 08/23/2015 07/09/11   Thurnell Lose, MD  insulin glargine (LANTUS) 100 UNIT/ML injection Inject 5 Units into the skin at bedtime. Patient not taking: Reported on 08/23/2015 07/16/11   Bary Leriche, PA-C     Family History  Problem Relation Age of Onset  . Diabetes Mother   . Hypertension Mother   . Hyperlipidemia Mother   . Diabetes Father   . Cancer Father     prostate  . Hyperlipidemia Father   . Hypertension Father   . Diabetes Brother   . Peripheral Artery Disease Brother     has had toes amputated    Social History   Social History  . Marital status: Married    Spouse name: N/A  . Number of children: N/A  . Years of education: N/A   Social History Main Topics  . Smoking status: Never Smoker  . Smokeless tobacco: Never Used  . Alcohol use No  . Drug use: No  . Sexual activity: Not Asked   Other Topics Concern  . None   Social History Narrative   Has a son and daughter       Review of Systems see above; also has noted occ HA's, intermittent rt sided chest discomfort, abd /back discomfort, recent nausea  Vital Signs: BP (!) 118/59   Pulse 75   Temp 97.7 F (36.5 C) (Oral)   Resp 15   LMP  (LMP Unknown)   SpO2 100%   Physical Exam awake/alert; chest- sl dim BS left base, right clear; heart- RRR; abd- soft,+BS,NT;  LLE edema, rt BKA; scatt hyperpigmented skin lesions, pustular lesions arms/neck, erythematous rash beneath breasts  Mallampati Score:     Imaging: Dg Chest 2 View  Result Date: 08/23/2015 CLINICAL DATA:  Cough, generalized weakness EXAM: CHEST  2 VIEW COMPARISON:  07/07/2011 FINDINGS: Significant cardiomegaly is noted. No infiltrate or pulmonary edema. There is small left pleural effusion. Bilateral basilar atelectasis. IMPRESSION: Significant cardiomegaly. Small left pleural effusion. Bilateral basilar atelectasis. No infiltrate or convincing pulmonary edema. Electronically Signed    By: Lahoma Crocker M.D.   On: 08/23/2015 20:19   US Renal  Result Date: 08/24/2015 CLINICAL DATA:  Acute onset of renal insufficiency. Initial encounter. EXAM: RENAL / URINARY TRACT ULTRASOUND COMPLETE COMPARISON:  None. FINDINGS: Right Kidney: Length: 12.8 cm. Echogenicity within normal limits. Moderate right-sided hydronephrosis is noted. No mass visualized. Left Kidney: Length: 12.2 cm. Echogenicity within normal limits. Moderate left-sided hydronephrosis is noted. No mass visualized. Bladder: Diffusely distended; the patient is unable to void. IMPRESSION: Moderate bilateral hydronephrosis. The bladder is distended, and the patient is unable to void. This could reflect bladder outlet obstruction. Bilateral ureteral obstruction cannot be excluded on the basis of this study. Noncontrast CT could be considered for further evaluation, as deemed clinically appropriate. Electronically Signed   By: Garald Balding M.D.   On: 08/24/2015 00:39    Labs:  CBC:  Recent Labs  08/23/15 1407  WBC 11.4*  HGB 4.2*  HCT 13.6*  PLT 10*    COAGS:  Recent Labs  08/23/15 1446  INR 1.49  APTT 41*    BMP:  Recent Labs  08/23/15 1407  NA 133*  K 4.3  CL 111  CO2 9*  GLUCOSE 93  BUN 94*  CALCIUM 7.2*  CREATININE 8.78*  GFRNONAA 5*  GFRAA 5*    LIVER FUNCTION TESTS:  Recent Labs  08/23/15 1407  BILITOT 1.0  AST 12*  ALT 10*  ALKPHOS 74  PROT 7.0  ALBUMIN 2.9*    TUMOR MARKERS: No results for input(s): AFPTM, CEA, CA199, CHROMGRNA in the last 8760 hours.  Assessment and Plan:  Pt with hx ARF/hematuria/bilat hydro, profound anemia/thrombocytopenia, DM, weakness, multiple hyperpigmented/pustular skin lesions; request now received for CT guided BM biopsy for further eval .Risks and benefits discussed with the patient including, but not limited to bleeding, infection, damage to adjacent structures or low yield requiring additional tests.All of the patient's questions were answered,  patient is agreeable to proceed.Consent signed and in chart. Procedure tent planned for 8/21.      Thank you for this interesting consult.  I greatly enjoyed meeting [name] and look forward to participating in their care.  A copy of this report was sent to the requesting provider on this date.  Electronically Signed: D. Rowe Robert 08/24/2015, 9:50 AM   I spent a total of 25 minutes in face to face in clinical consultation, greater than 50% of which was counseling/coordinating care for CT guided bone marrow biopsy

## 2015-08-25 ENCOUNTER — Inpatient Hospital Stay (HOSPITAL_COMMUNITY): Payer: Medicaid Other

## 2015-08-25 DIAGNOSIS — I10 Essential (primary) hypertension: Secondary | ICD-10-CM

## 2015-08-25 DIAGNOSIS — I517 Cardiomegaly: Secondary | ICD-10-CM

## 2015-08-25 DIAGNOSIS — N390 Urinary tract infection, site not specified: Secondary | ICD-10-CM

## 2015-08-25 LAB — GLUCOSE, CAPILLARY
Glucose-Capillary: 84 mg/dL (ref 65–99)
Glucose-Capillary: 93 mg/dL (ref 65–99)
Glucose-Capillary: 99 mg/dL (ref 65–99)
Glucose-Capillary: 89 mg/dL (ref 65–99)

## 2015-08-25 LAB — CBC WITH DIFFERENTIAL/PLATELET
Basophils Absolute: 0 10*3/uL (ref 0.0–0.1)
Basophils Absolute: 0 10*3/uL (ref 0.0–0.1)
Basophils Absolute: 0 10*3/uL (ref 0.0–0.1)
Basophils Relative: 0 %
Basophils Relative: 0 %
Basophils Relative: 0 %
Eosinophils Absolute: 0.1 10*3/uL (ref 0.0–0.7)
Eosinophils Absolute: 0.1 10*3/uL (ref 0.0–0.7)
Eosinophils Absolute: 0 10*3/uL (ref 0.0–0.7)
Eosinophils Relative: 1 %
Eosinophils Relative: 1 %
Eosinophils Relative: 0 %
HCT: 22.3 % — ABNORMAL LOW (ref 36.0–46.0)
HCT: 22.7 % — ABNORMAL LOW (ref 36.0–46.0)
HCT: 19.9 % — ABNORMAL LOW (ref 36.0–46.0)
Hemoglobin: 7.3 g/dL — ABNORMAL LOW (ref 12.0–15.0)
Hemoglobin: 7.5 g/dL — ABNORMAL LOW (ref 12.0–15.0)
Hemoglobin: 6.5 g/dL — CL (ref 12.0–15.0)
Lymphocytes Relative: 13 %
Lymphocytes Relative: 11 %
Lymphocytes Relative: 8 %
Lymphs Abs: 1.2 10*3/uL (ref 0.7–4.0)
Lymphs Abs: 1.1 10*3/uL (ref 0.7–4.0)
Lymphs Abs: 1.2 10*3/uL (ref 0.7–4.0)
MCH: 27.7 pg (ref 26.0–34.0)
MCH: 27.9 pg (ref 26.0–34.0)
MCH: 27.5 pg (ref 26.0–34.0)
MCHC: 32.7 g/dL (ref 30.0–36.0)
MCHC: 33 g/dL (ref 30.0–36.0)
MCHC: 32.7 g/dL (ref 30.0–36.0)
MCV: 84.5 fL (ref 78.0–100.0)
MCV: 84.4 fL (ref 78.0–100.0)
MCV: 84.3 fL (ref 78.0–100.0)
Monocytes Absolute: 0.7 10*3/uL (ref 0.1–1.0)
Monocytes Absolute: 1.1 10*3/uL — ABNORMAL HIGH (ref 0.1–1.0)
Monocytes Absolute: 0.9 10*3/uL (ref 0.1–1.0)
Monocytes Relative: 8 %
Monocytes Relative: 11 %
Monocytes Relative: 6 %
Neutro Abs: 7.1 10*3/uL (ref 1.7–7.7)
Neutro Abs: 7.1 10*3/uL (ref 1.7–7.7)
Neutro Abs: 13 10*3/uL — ABNORMAL HIGH (ref 1.7–7.7)
Neutrophils Relative %: 78 %
Neutrophils Relative %: 76 %
Neutrophils Relative %: 86 %
Platelets: 26 10*3/uL — CL (ref 150–400)
Platelets: 29 10*3/uL — CL (ref 150–400)
Platelets: 18 10*3/uL — CL (ref 150–400)
RBC: 2.64 MIL/uL — ABNORMAL LOW (ref 3.87–5.11)
RBC: 2.69 MIL/uL — ABNORMAL LOW (ref 3.87–5.11)
RBC: 2.36 MIL/uL — ABNORMAL LOW (ref 3.87–5.11)
RDW: 20 % — ABNORMAL HIGH (ref 11.5–15.5)
RDW: 20.1 % — ABNORMAL HIGH (ref 11.5–15.5)
RDW: 21 % — ABNORMAL HIGH (ref 11.5–15.5)
WBC: 9.1 10*3/uL (ref 4.0–10.5)
WBC: 9.4 10*3/uL (ref 4.0–10.5)
WBC: 15.1 10*3/uL — ABNORMAL HIGH (ref 4.0–10.5)

## 2015-08-25 LAB — ECHOCARDIOGRAM LIMITED
FS: 36 % (ref 28–44)
IVS/LV PW RATIO, ED: 0.78
LA ID, A-P, ES: 43 mm
LA diam end sys: 43 mm
LA diam index: 1.98 cm/m2
LV PW d: 11.6 mm — AB (ref 0.6–1.1)
LVOT area: 3.46 cm2
LVOT diameter: 21 mm
RV sys press: 26 mmHg
Reg peak vel: 239 cm/s
TR max vel: 239 cm/s
Weight: 3777.8 oz

## 2015-08-25 LAB — PROTIME-INR
INR: 1.41
Prothrombin Time: 17.4 seconds — ABNORMAL HIGH (ref 11.4–15.2)

## 2015-08-25 LAB — BASIC METABOLIC PANEL
Anion gap: 13 (ref 5–15)
BUN: 80 mg/dL — ABNORMAL HIGH (ref 6–20)
CO2: 13 mmol/L — ABNORMAL LOW (ref 22–32)
Calcium: 6.5 mg/dL — ABNORMAL LOW (ref 8.9–10.3)
Chloride: 115 mmol/L — ABNORMAL HIGH (ref 101–111)
Creatinine, Ser: 7.03 mg/dL — ABNORMAL HIGH (ref 0.44–1.00)
GFR calc Af Amer: 7 mL/min — ABNORMAL LOW (ref >60)
GFR calc non Af Amer: 6 mL/min — ABNORMAL LOW (ref >60)
Glucose, Bld: 79 mg/dL (ref 65–99)
Potassium: 3.9 mmol/L (ref 3.5–5.1)
Sodium: 141 mmol/L (ref 135–145)

## 2015-08-25 LAB — TYPE AND SCREEN
ABO/RH(D): B POS
Antibody Screen: NEGATIVE
Unit division: 0
Unit division: 0
Unit division: 0
Unit division: 0

## 2015-08-25 LAB — PREPARE PLATELET PHERESIS
Unit division: 0
Unit division: 0

## 2015-08-25 LAB — ALDOLASE: Aldolase: 10.3 U/L (ref 3.3–10.3)

## 2015-08-25 LAB — BONE MARROW EXAM

## 2015-08-25 LAB — C3 COMPLEMENT: C3 Complement: 113 mg/dL (ref 82–167)

## 2015-08-25 LAB — C4 COMPLEMENT: Complement C4, Body Fluid: 24 mg/dL (ref 14–44)

## 2015-08-25 LAB — EXTRACTABLE NUCLEAR ANTIGEN ANTIBODY
ENA SM Ab Ser-aCnc: <0.2 AI (ref 0.0–0.9)
Ribonucleic Protein: <0.2 AI (ref 0.0–0.9)
SSA (Ro) (ENA) Antibody, IgG: <0.2 AI (ref 0.0–0.9)
SSB (La) (ENA) Antibody, IgG: <0.2 AI (ref 0.0–0.9)
Scleroderma (Scl-70) (ENA) Antibody, IgG: <0.2 AI (ref 0.0–0.9)
ds DNA Ab: <1 IU/mL (ref 0–9)

## 2015-08-25 LAB — IGG: IgG (Immunoglobin G), Serum: 1650 mg/dL — ABNORMAL HIGH (ref 700–1600)

## 2015-08-25 LAB — CALCIUM, IONIZED: Calcium, Ionized, Serum: 3.8 mg/dL — ABNORMAL LOW (ref 4.5–5.6)

## 2015-08-25 LAB — ANTI-JO 1 ANTIBODY, IGG: Anti JO-1: <0.2 AI (ref 0.0–0.9)

## 2015-08-25 LAB — ANTINUCLEAR ANTIBODIES, IFA: ANA Ab, IFA: NEGATIVE

## 2015-08-25 LAB — SJOGRENS SYNDROME-B EXTRACTABLE NUCLEAR ANTIBODY: SSB (La) (ENA) Antibody, IgG: <0.2 AI (ref 0.0–0.9)

## 2015-08-25 LAB — ANTI-SMITH ANTIBODY: ENA SM Ab Ser-aCnc: <0.2 AI (ref 0.0–0.9)

## 2015-08-25 LAB — SJOGRENS SYNDROME-A EXTRACTABLE NUCLEAR ANTIBODY: SSA (Ro) (ENA) Antibody, IgG: <0.2 AI (ref 0.0–0.9)

## 2015-08-25 MED ORDER — MIDAZOLAM HCL 2 MG/2ML IJ SOLN
INTRAMUSCULAR | Status: AC
  Filled 2015-08-25: qty 2

## 2015-08-25 MED ORDER — FENTANYL CITRATE (PF) 100 MCG/2ML IJ SOLN
INTRAMUSCULAR | Status: AC
  Filled 2015-08-25: qty 2

## 2015-08-25 MED ORDER — MIDAZOLAM HCL 2 MG/2ML IJ SOLN
INTRAMUSCULAR | Status: AC | PRN
  Administered 2015-08-25 (×2): 1 mg via INTRAVENOUS

## 2015-08-25 MED ORDER — LIDOCAINE HCL 1 % IJ SOLN
INTRAMUSCULAR | Status: AC
  Filled 2015-08-25: qty 20

## 2015-08-25 MED ORDER — FLUCONAZOLE IN SODIUM CHLORIDE 100-0.9 MG/50ML-% IV SOLN
50.0000 mg | INTRAVENOUS | Status: DC
  Administered 2015-08-26 – 2015-09-03 (×9): 50 mg via INTRAVENOUS
  Filled 2015-08-25 (×11): qty 25

## 2015-08-25 MED ORDER — INSULIN ASPART 100 UNIT/ML ~~LOC~~ SOLN: 0.0000 [IU] | Freq: Every day | SUBCUTANEOUS | Status: DC

## 2015-08-25 MED ORDER — INSULIN ASPART 100 UNIT/ML ~~LOC~~ SOLN: 0.0000 [IU] | Freq: Three times a day (TID) | SUBCUTANEOUS | Status: DC

## 2015-08-25 MED ORDER — FENTANYL CITRATE (PF) 100 MCG/2ML IJ SOLN
INTRAMUSCULAR | Status: AC | PRN
  Administered 2015-08-25: 50 ug via INTRAVENOUS

## 2015-08-25 MED ORDER — INSULIN ASPART 100 UNIT/ML ~~LOC~~ SOLN
0.0000 [IU] | Freq: Three times a day (TID) | SUBCUTANEOUS | Status: DC
  Administered 2015-08-26 – 2015-08-28 (×3): 3 [IU] via SUBCUTANEOUS
  Administered 2015-08-29: 4 [IU] via SUBCUTANEOUS
  Administered 2015-08-29 – 2015-09-02 (×7): 3 [IU] via SUBCUTANEOUS
  Administered 2015-09-04: 4 [IU] via SUBCUTANEOUS
  Administered 2015-09-04: 3 [IU] via SUBCUTANEOUS

## 2015-08-25 NOTE — Sedation Documentation (Signed)
Patient is resting comfortably. 

## 2015-08-25 NOTE — Progress Notes (Signed)
[name] has had some nausea and some anxiety today, but is otherwise not digressing.  Patient's blood sugar has been therapeutic and no insulin has been required.  Patient has been alert and oriented x4.  Lung sounds are clear except for right mid-posterior lobe, in which I noticed some wheezing. S1, S2 present with no adventitious heart sounds present.  Patient has many lesions all over body, which seem to weep.  Patient denies pain.

## 2015-08-25 NOTE — Progress Notes (Signed)
PULMONARY / CRITICAL CARE MEDICINE   Name: JOSHUA ZERINGUE MRN: [id number] DOB: [birth year]    ADMISSION DATE:  08/23/2015 CONSULTATION DATE:  08/23/15  REFERRING MD:  Dr. Aggie Moats  CHIEF COMPLAINT:  Anemia, AKI, metabolic acidosis  HISTORY OF PRESENT ILLNESS:   Ms. Bultman is a 50 y/o woman with PMH listed below.  She has had worsening weakness over the last few months.  Over the last week this has progressed to the point where she is not able to stand on her own (she uses a walker due to BKA on the right).  She has also had loss of sensation in her hands and foot as well as loss of urinary continence. In the ED she was found to have a hemoglobin of 4.8 and platelets of 10, as well as AKI with Cr of 8.0.  Despite her low Hgb, her HR was in the 80s and her BP was 676-195 systolic.  She also recently noticed papules on her fingers, forarms and shins.  They are not painful or prurulent.  She also has developed an excoriated rash on her chest and upper abdomen.   SUBJECTIVE: No acute events overnight. Nurse reports hematuria seems to be lessening. Patient did have bone marrow biopsy this morning. Patient did have some nausea this morning prior to procedure. Denies any abdominal pain. Denies any subjective fever, chills, or sweats.  REVIEW OF SYSTEMS: No chest pain or pressure. No dyspnea or cough. No headache or vision changes.  VITAL SIGNS: BP 120/66   Pulse 77   Temp 97.8 F (36.6 C) (Oral)   Resp 14   Wt 236 lb 1.8 oz (107.1 kg)   LMP  (LMP Unknown)   SpO2 99%   BMI 36.98 kg/m      INTAKE / OUTPUT: I/O last 3 completed shifts: In: 6100.5 [P.O.:500; I.V.:4000; Blood:1450.5; IV Piggyback:150] Out: 0932 [Urine:6660]  PHYSICAL EXAMINATION: General:  Awake. No distress. Nurse at bedside. Neuro:  Moving extremities equally. Alert and oriented 4. No meningismus. Grossly nonfocal. HEENT: Moist mucous membranes. No oral ulcers. No scleral icterus.  Cardiovascular:  regular rate & rhythm.  No appreciable JVD or edema.  Lungs:  clear bilaterally on auscultation. Normal work of breathing on room air. No accessory muscle use.  Abdomen:  Soft. Protuberant. Nontender.  Musculoskeletal:right below-the-knee amputation noted. No joint deformity or effusion appreciated.  Integument: Warm and dry. Bilateral papular rash on exposed extremities.   LABS:  BMET  Recent Labs Lab 08/23/15 1407 08/24/15 1015 08/25/15 0424  NA 133* 138 141  K 4.3 4.2 3.9  CL 111 116* 115*  CO2 9* 9* 13*  BUN 94* 90* 80*  CREATININE 8.78* 8.24* 7.03*  GLUCOSE 93 110* 79    Electrolytes  Recent Labs Lab 08/23/15 1407 08/24/15 1015 08/25/15 0424  CALCIUM 7.2* 6.9* 6.5*  MG  --  2.0  --   PHOS  --  7.6*  --     CBC  Recent Labs Lab 08/24/15 1015 08/25/15 0015 08/25/15 0424  WBC 15.1* 9.1 9.4  HGB 6.5* 7.3* 7.5*  HCT 19.9* 22.3* 22.7*  PLT 18* 26* 29*    Coag's  Recent Labs Lab 08/23/15 1446 08/24/15 1015 08/25/15 0424  APTT 41*  --   --   INR 1.49 1.56 1.41    Sepsis Markers  Recent Labs Lab 08/24/15 0119 08/24/15 1015  LATICACIDVEN 0.8 1.0  PROCALCITON 0.21  --     ABG No results for input(s): PHART, PCO2ART, PO2ART in  the last 168 hours.  Liver Enzymes  Recent Labs Lab 08/23/15 1407 08/24/15 1015  AST 12* 13*  ALT 10* 11*  ALKPHOS 74 70  BILITOT 1.0 0.8  ALBUMIN 2.9* 2.5*    Cardiac Enzymes No results for input(s): TROPONINI, PROBNP in the last 168 hours.  Glucose  Recent Labs Lab 08/24/15 0056 08/24/15 0835 08/24/15 1212 08/24/15 1657 08/24/15 2133 08/25/15 0804  GLUCAP 89 104* 127* 74 86 84    Imaging No results found.   STUDIES:  CXR PA/LAT 8/19: Questionable small left pleural effusion. No focal consolidation appreciated. Silhouetting of left hemidiaphragm. Cardiomegaly. Renal U/S 8/20: Diffusely distended bladder & moderate bilateral hydronephrosis. Normal renal size and echogenicity. LLE Duplex 8/20:  No DVT TTE  >>  LABS: CRP:  5.1 ESR:  108 C3 >> C4 >> IgG >>  SSA >>  SSB >>  A-Smith Ab >>  A-Jo Ab >>  ANA >>  ENA >>  Aldolase >>  CK:  269 SPEP >>  TSH:  5.461 Free T4:  0.79  PATHOLOGY: Bone Marrow Bx 8/21 >>  MICROBIOLOGY: MRSA PCR 8/20:  Negative  Blood Ctx x2 8/19 >> Urine Ctx 8/19:  Multiple species  ANTIBIOTICS: Ceftriaxone x1 08/23/15 Levaquin 8/19 >> Fluconazole 8/19 >>  SIGNIFICANT EVENTS: 8/19 - Admit 8/21 - Bone marrow biopsy  LINES/TUBES: Foley 8/20 >> PIV x2  ASSESSMENT / PLAN:  HEMATOLOGIC A:   Anemia - Improved post transfusion. S/P 2u 8/19 & 2u 8/20. Thrombocytopenia - Improved post transfusion. S/P 1u 8/19 & 1u 8/20.  P:  Hematology/Dr. [name] following Awaiting Bone Marrow Biopsy results Awaiting serum autoimmune labs Transfuse for Hgb <7 and platelets less than 10K or active bleeding.  RENAL A:   Acute Renal Failure - Hydronephrosis on Renal U/S. Improving.  Metabolic Acidosis - On Bicarb gtt. Hematuria - Post foley placement.  P:   Nephrology following Trending UOP with Foley Trending electrolytes & renal function daily Bicarb gtt  DERMATOLOGIC A: Papular Rash  P: Consult derm for biopsy of lesions. Empiric Diflucan   PULMONARY A: Acute Respiratory Alkalosis - Compensatory.  P:   Monitor respiratory status Continuous pulse oximetry   CARDIOVASCULAR A:  Cardiomegaly - Seen on CXR. H/O HTN  P:  Vitals per unit protocol Continuous telemetry monitoring TTE pending  INFECTIOUS A:   UTI - Polymicrobial on ctx. Rash  P:   Empiric Levaquin Day #3 for UTI Empiric Diflucan Day #3 for rash Blood Ctx pending Plan to re-culture for fever  ENDOCRINE A:   H/O DM - Glucose controlled.  P:   Continuing Accu-Checks qAC & HS SSI per resistant algorithm  NEUROLOGIC A:   Weakness/Numbness/Tingling - No focal deficit.  Insomnia - Mild.  P:   Monitor for now, may need neurology consult, possible EMG Trazodone  qhs prn  GASTROENTEROLOGY: A: Nausea - Mild. Resolved.  P: Resume diet. Zofran IV prn  FAMILY  - Updates: family is in [place], were here on admission but left to go back home.   - Inter-disciplinary family meet or Palliative Care meeting due by:  8/26   TODAY'S SUMMARY:  50 y.o. woman with history of DM and progressive weakness for the last few months found to have severe anemia and thrombocytopenia, & acute renal failure. Suspect autoimmune process (SLE) vs. Malignancy. Now status post bone marrow biopsy to determine if there is a malignant process. Autoimmune serum workup is pending. With patient's thrombocytopenia risk of bleed from renal biopsy will likely be too  high. Even so, her acute renal failure seems to be slowly improving. Appreciated input from nephrology and hematology. As patient continues to remain clinically stable I am transitioning her to a stepdown bed. TRH to assume care & PCCM will sign off as of 8/22.  Sonia Baller Ashok Cordia, M.D. [facility] Pager:  [phone] After 3pm or if no response, call [phone] 08/25/2015, 10:35 AM

## 2015-08-25 NOTE — Procedures (Signed)
Interventional Radiology Procedure Note  Procedure: CT guided bone marrow biopsy Complications: None Recommendations:  - Ok to shower tomorrow - Do not submerge for 7 days - Routine wound care   Signed,  Dulcy Fanny. Earleen Newport, DO

## 2015-08-25 NOTE — Progress Notes (Signed)
S: Awake and alert.  Feels better with foley O:BP (!) 126/56   Pulse 78   Temp 97.8 F (36.6 C) (Oral)   Resp 11   Wt 107.1 kg (236 lb 1.8 oz)   LMP  (LMP Unknown)   SpO2 98%   BMI 36.98 kg/m   Intake/Output Summary (Last 24 hours) at 08/25/15 0749 Last data filed at 08/25/15 0700  Gross per 24 hour  Intake           4425.5 ml  Output             5060 ml  Net           -634.5 ml   Weight change:  OLM:BEMLJ and alert CVS:RRR Resp:Clear Abd:+ BS NTND Bladder not palpable Ext:Rt BKA.  No edema.  Mult chronic pustular lesions over arms/legs/torso NEURO: Awake and alert, Ox3  No asterixis   . fluconazole (DIFLUCAN) IV  50 mg Intravenous Q24H  . insulin aspart  0-20 Units Subcutaneous TID WC  . levofloxacin (LEVAQUIN) IV  500 mg Intravenous Q48H   Dg Chest 2 View  Result Date: 08/23/2015 CLINICAL DATA:  Cough, generalized weakness EXAM: CHEST  2 VIEW COMPARISON:  07/07/2011 FINDINGS: Significant cardiomegaly is noted. No infiltrate or pulmonary edema. There is small left pleural effusion. Bilateral basilar atelectasis. IMPRESSION: Significant cardiomegaly. Small left pleural effusion. Bilateral basilar atelectasis. No infiltrate or convincing pulmonary edema. Electronically Signed   By: Lahoma Crocker M.D.   On: 08/23/2015 20:19   US Renal  Result Date: 08/24/2015 CLINICAL DATA:  Acute onset of renal insufficiency. Initial encounter. EXAM: RENAL / URINARY TRACT ULTRASOUND COMPLETE COMPARISON:  None. FINDINGS: Right Kidney: Length: 12.8 cm. Echogenicity within normal limits. Moderate right-sided hydronephrosis is noted. No mass visualized. Left Kidney: Length: 12.2 cm. Echogenicity within normal limits. Moderate left-sided hydronephrosis is noted. No mass visualized. Bladder: Diffusely distended; the patient is unable to void. IMPRESSION: Moderate bilateral hydronephrosis. The bladder is distended, and the patient is unable to void. This could reflect bladder outlet obstruction. Bilateral  ureteral obstruction cannot be excluded on the basis of this study. Noncontrast CT could be considered for further evaluation, as deemed clinically appropriate. Electronically Signed   By: Garald Balding M.D.   On: 08/24/2015 00:39   BMET    Component Value Date/Time   NA 141 08/25/2015 0424   K 3.9 08/25/2015 0424   CL 115 (H) 08/25/2015 0424   CO2 13 (L) 08/25/2015 0424   GLUCOSE 79 08/25/2015 0424   BUN 80 (H) 08/25/2015 0424   CREATININE 7.03 (H) 08/25/2015 0424   CALCIUM 6.5 (L) 08/25/2015 0424   GFRNONAA 6 (L) 08/25/2015 0424   GFRAA 7 (L) 08/25/2015 0424   CBC    Component Value Date/Time   WBC 9.4 08/25/2015 0424   RBC 2.69 (L) 08/25/2015 0424   HGB 7.5 (L) 08/25/2015 0424   HCT 22.7 (L) 08/25/2015 0424   PLT 29 (LL) 08/25/2015 0424   MCV 84.4 08/25/2015 0424   MCH 27.9 08/25/2015 0424   MCHC 33.0 08/25/2015 0424   RDW 20.1 (H) 08/25/2015 0424   LYMPHSABS 1.1 08/25/2015 0424   MONOABS 1.1 (H) 08/25/2015 0424   EOSABS 0.1 08/25/2015 0424   BASOSABS 0.0 08/25/2015 0424     Assessment:  1. Presumably ARF in setting of Bil hydronephrosis,  probable BOO as UO excellent after foley placement and Scr less today 2. Anemia/thrombocytopenia  SP BM bx.   3. DM 4.  Chronic diabetic skin  lesions 5. Met acidosis on bicarb gtt  Plan: 1. Cont foley.  She will need urology to see at some point but her hematologic issues are most important now 2. Daily Scr 3. Cont bicarb gtt 4. Will plan to repeat renal US in few days to see if hydro resolves.  Only time will tell where renal fx will recover to as ? How long this has been going on.   Alaijah Gibler T

## 2015-08-25 NOTE — Progress Notes (Signed)
IP PROGRESS NOTE  Subjective:  She complains of numbness in the hands with tingling and a "tight" feeling. She has similar symptoms at the left foot.  Objective: Vital signs in last 24 hours: Blood pressure (!) 149/79, pulse 88, temperature 97.8 F (36.6 C), temperature source Oral, resp. rate 17, weight 236 lb 1.8 oz (107.1 kg), SpO2 100 %.  Intake/Output from previous day: 08/20 0701 - 08/21 0700 In: 4425.5 [P.O.:500; I.V.:2950; Blood:925.5; IV Piggyback:50] Out: 5060 [Urine:5060]  Physical Exam:  HEENT: Resolved ecchymosis at the left buccal mucosa, no thrush  Skin: Multiple crusted hyperpigmented lesions over the extremities, several pustular lesions over the face and neck. Neurologic: The motor exam is intact in the arms/hands and left leg/foot  Lab Results:  Recent Labs  08/25/15 0015 08/25/15 0424  WBC 9.1 9.4  HGB 7.3* 7.5*  HCT 22.3* 22.7*  PLT 26* 29*    BMET  Recent Labs  08/24/15 1015 08/25/15 0424  NA 138 141  K 4.2 3.9  CL 116* 115*  CO2 9* 13*  GLUCOSE 110* 79  BUN 90* 80*  CREATININE 8.24* 7.03*  CALCIUM 6.9* 6.5*    Studies/Results: Dg Chest 2 View  Result Date: 08/23/2015 CLINICAL DATA:  Cough, generalized weakness EXAM: CHEST  2 VIEW COMPARISON:  07/07/2011 FINDINGS: Significant cardiomegaly is noted. No infiltrate or pulmonary edema. There is small left pleural effusion. Bilateral basilar atelectasis. IMPRESSION: Significant cardiomegaly. Small left pleural effusion. Bilateral basilar atelectasis. No infiltrate or convincing pulmonary edema. Electronically Signed   By: Lahoma Crocker M.D.   On: 08/23/2015 20:19   US Renal  Result Date: 08/24/2015 CLINICAL DATA:  Acute onset of renal insufficiency. Initial encounter. EXAM: RENAL / URINARY TRACT ULTRASOUND COMPLETE COMPARISON:  None. FINDINGS: Right Kidney: Length: 12.8 cm. Echogenicity within normal limits. Moderate right-sided hydronephrosis is noted. No mass visualized. Left Kidney: Length:  12.2 cm. Echogenicity within normal limits. Moderate left-sided hydronephrosis is noted. No mass visualized. Bladder: Diffusely distended; the patient is unable to void. IMPRESSION: Moderate bilateral hydronephrosis. The bladder is distended, and the patient is unable to void. This could reflect bladder outlet obstruction. Bilateral ureteral obstruction cannot be excluded on the basis of this study. Noncontrast CT could be considered for further evaluation, as deemed clinically appropriate. Electronically Signed   By: Garald Balding M.D.   On: 08/24/2015 00:39   Ct Biopsy  Result Date: 08/25/2015 INDICATION: 50 year old female with a history of thrombus cytopenia. She has been referred for bone marrow biopsy EXAM: CT BIOPSY MEDICATIONS: None. ANESTHESIA/SEDATION: Moderate (conscious) sedation was employed during this procedure. A total of Versed 1.0 mg and Fentanyl 50 mcg was administered intravenously. Moderate Sedation Time: 10 minutes. The patient's level of consciousness and vital signs were monitored continuously by radiology nursing throughout the procedure under my direct supervision. FLUOROSCOPY TIME:  CT COMPLICATIONS: None PROCEDURE: The procedure risks, benefits, and alternatives were explained to the patient. Questions regarding the procedure were encouraged and answered. The patient understands and consents to the procedure. Scout CT of the pelvis was performed for surgical planning purposes. The posterior pelvis was prepped with Betadinein a sterile fashion, and a sterile drape was applied covering the operative field. A sterile gown and sterile gloves were used for the procedure. Local anesthesia was provided with 1% Lidocaine. We targeted the right posterior iliac bone for biopsy. The skin and subcutaneous tissues were infiltrated with 1% lidocaine without epinephrine. A small stab incision was made with an 11 blade scalpel, and an 11 gauge  Murphy needle was advanced with CT guidance to the  posterior cortex. Manual forced was used to advance the needle through the posterior cortex and the stylet was removed. A bone marrow aspirate was retrieved and passed to a cytotechnologist in the room. The Murphy needle was then advanced without the stylet for a core biopsy. The core biopsy was retrieved and also passed to a cytotechnologist. Manual pressure was used for hemostasis and a sterile dressing was placed. No complications were encountered no significant blood loss was encountered. Patient tolerated the procedure well and remained hemodynamically stable throughout. IMPRESSION: Status post CT-guided bone marrow biopsy, with tissue specimen sent to pathology for complete histopathologic analysis Signed, Dulcy Fanny. Earleen Newport, DO [facility] Rad[facility] Electronically Signed   By: Corrie Mckusick D.O.   On: 08/25/2015 10:42    Medications: I have reviewed the patient's current medications.  Assessment/Plan:  1. Severe anemia and thrombocytopenia-improved following multiple transfusions 2. Renal failure/bilateral hydronephrosis-creatinine improved today, good urine output 3. Pustular/ulcerated skin lesions/decubitus ulcers 4. Yeast rash beneath the breasts and in the groin 5. Diabetes 6. Status post right below-knee amputation in 2012 7. History of "neuropathy "  She appears stable. The hemoglobin and platelets are partially improved following transfusions. She is scheduled for a bone marrow biopsy today. I discussed the case with pathology and they will contact me after review of the bone marrow aspirate.  The peripheral numbness and tingling may be related to diabetic neuropathy. I have a low clinical suspicion for a cervical cord abnormality.  Recommendations: 1. Biopsy/culture of a skin lesion 2. Bone marrow biopsy today 3. Continue antibiotics, follow-up cultures 4. I will follow-up on bone marrow biopsy and continue to follow her daily    LOS: 2 days   Betsy Coder, MD   08/25/2015, 1:43 PM

## 2015-08-25 NOTE — Consult Note (Addendum)
WOC Nurse wound consult note Reason for Consult: Consult requested for multiple wounds.  Pt actually has dry scabbed raised lesions spread all over her body.  There is no drainage or odor at this time and she states they have appeared over the past several months.  In the beginning, she states they were raised vesicles which occasionally spontaneously ruptured and drained mod amt tan drainage.  One of these type of wounds is currently located to her inner right buttock and has evolved into a full thickness wound.  1X2X.8cm, red and moist, mod amt tan drainage, no odor, painful to touch with induration surrounding the location when palpated.  Wound type: Multiple sites of lesions of unknown etiology; pt has an autoimmune disorder according to the EMR and these could be a type of vasculitis, calciphylaxis, scleroderma, or many other chronic skin conditions; agree patient could benefit from a biopsy. Dermatology is not available as an inpatient.  Recommend surgical consult to assess inner buttock wound and perform a biopsy of another site.  Discussed plan of care with primary team. Pressure Ulcer POA: These are NOT pressure injuries Dressing procedure/placement/frequency: Aquacel packing to inner buttock to provide antimicrobial benefits and absorb drainage to promote healing until further input is available from the surgical team.  Please re-consult if further assistance is needed.  Thank-you,  Cammie Mcgeeawn Nema Oatley MSN, RN, CWOCN, [place]CN-AP, CNS [phone]

## 2015-08-25 NOTE — Care Management (Signed)
CM left voicemail for Financial Counselor Bascom LevelsDenise Jones requesting call back regarding medicaid.

## 2015-08-25 NOTE — Progress Notes (Signed)
  Echocardiogram 2D Echocardiogram has been performed.  Delcie RochENNINGTON, Nioma Mccubbins 08/25/2015, 3:35 PM

## 2015-08-26 DIAGNOSIS — E1165 Type 2 diabetes mellitus with hyperglycemia: Secondary | ICD-10-CM | POA: Diagnosis present

## 2015-08-26 DIAGNOSIS — N19 Unspecified kidney failure: Secondary | ICD-10-CM

## 2015-08-26 DIAGNOSIS — IMO0002 Reserved for concepts with insufficient information to code with codable children: Secondary | ICD-10-CM | POA: Diagnosis present

## 2015-08-26 DIAGNOSIS — E118 Type 2 diabetes mellitus with unspecified complications: Secondary | ICD-10-CM

## 2015-08-26 DIAGNOSIS — R200 Anesthesia of skin: Secondary | ICD-10-CM

## 2015-08-26 DIAGNOSIS — N1330 Unspecified hydronephrosis: Secondary | ICD-10-CM | POA: Diagnosis present

## 2015-08-26 DIAGNOSIS — R202 Paresthesia of skin: Secondary | ICD-10-CM

## 2015-08-26 DIAGNOSIS — R21 Rash and other nonspecific skin eruption: Secondary | ICD-10-CM | POA: Diagnosis present

## 2015-08-26 DIAGNOSIS — N189 Chronic kidney disease, unspecified: Secondary | ICD-10-CM

## 2015-08-26 DIAGNOSIS — N179 Acute kidney failure, unspecified: Secondary | ICD-10-CM | POA: Diagnosis present

## 2015-08-26 LAB — GLUCOSE, CAPILLARY
Glucose-Capillary: 89 mg/dL (ref 65–99)
Glucose-Capillary: 92 mg/dL (ref 65–99)
Glucose-Capillary: 129 mg/dL — ABNORMAL HIGH (ref 65–99)
Glucose-Capillary: 111 mg/dL — ABNORMAL HIGH (ref 65–99)

## 2015-08-26 LAB — CBC WITH DIFFERENTIAL/PLATELET
Basophils Absolute: 0 10*3/uL (ref 0.0–0.1)
Basophils Relative: 0 %
Eosinophils Absolute: 0.1 10*3/uL (ref 0.0–0.7)
Eosinophils Relative: 1 %
HCT: 22.6 % — ABNORMAL LOW (ref 36.0–46.0)
Hemoglobin: 7.4 g/dL — ABNORMAL LOW (ref 12.0–15.0)
Lymphocytes Relative: 13 %
Lymphs Abs: 1.1 10*3/uL (ref 0.7–4.0)
MCH: 27.8 pg (ref 26.0–34.0)
MCHC: 32.7 g/dL (ref 30.0–36.0)
MCV: 85 fL (ref 78.0–100.0)
Monocytes Absolute: 0.7 10*3/uL (ref 0.1–1.0)
Monocytes Relative: 8 %
Neutro Abs: 6.5 10*3/uL (ref 1.7–7.7)
Neutrophils Relative %: 78 %
Platelets: 32 10*3/uL — ABNORMAL LOW (ref 150–400)
RBC: 2.66 MIL/uL — ABNORMAL LOW (ref 3.87–5.11)
RDW: 21 % — ABNORMAL HIGH (ref 11.5–15.5)
WBC: 8.4 10*3/uL (ref 4.0–10.5)

## 2015-08-26 LAB — MULTIPLE MYELOMA PANEL, SERUM
Albumin SerPl Elph-Mcnc: 3.2 g/dL (ref 2.9–4.4)
Albumin/Glob SerPl: 0.9 (ref 0.7–1.7)
Alpha 1: 0.4 g/dL (ref 0.0–0.4)
Alpha2 Glob SerPl Elph-Mcnc: 0.7 g/dL (ref 0.4–1.0)
B-Globulin SerPl Elph-Mcnc: 0.9 g/dL (ref 0.7–1.3)
Gamma Glob SerPl Elph-Mcnc: 1.7 g/dL (ref 0.4–1.8)
Globulin, Total: 3.8 g/dL (ref 2.2–3.9)
IgA: 264 mg/dL (ref 87–352)
IgG (Immunoglobin G), Serum: 1671 mg/dL — ABNORMAL HIGH (ref 700–1600)
IgM, Serum: 220 mg/dL — ABNORMAL HIGH (ref 26–217)
Total Protein ELP: 7 g/dL (ref 6.0–8.5)

## 2015-08-26 LAB — RENAL FUNCTION PANEL
Albumin: 2.2 g/dL — ABNORMAL LOW (ref 3.5–5.0)
Anion gap: 10 (ref 5–15)
BUN: 73 mg/dL — ABNORMAL HIGH (ref 6–20)
CO2: 22 mmol/L (ref 22–32)
Calcium: 6.1 mg/dL — CL (ref 8.9–10.3)
Chloride: 110 mmol/L (ref 101–111)
Creatinine, Ser: 6.37 mg/dL — ABNORMAL HIGH (ref 0.44–1.00)
GFR calc Af Amer: 8 mL/min — ABNORMAL LOW (ref >60)
GFR calc non Af Amer: 7 mL/min — ABNORMAL LOW (ref >60)
Glucose, Bld: 80 mg/dL (ref 65–99)
Phosphorus: 5.4 mg/dL — ABNORMAL HIGH (ref 2.5–4.6)
Potassium: 3.6 mmol/L (ref 3.5–5.1)
Sodium: 142 mmol/L (ref 135–145)

## 2015-08-26 LAB — HEMOGLOBINOPATHY EVALUATION
Hgb A2 Quant: 2.2 % (ref 0.7–3.1)
Hgb A: 97.8 % (ref 94.0–98.0)
Hgb C: 0 %
Hgb F Quant: 0 % (ref 0.0–2.0)
Hgb S Quant: 0 %

## 2015-08-26 LAB — MAGNESIUM: Magnesium: 1.6 mg/dL — ABNORMAL LOW (ref 1.7–2.4)

## 2015-08-26 LAB — PROTIME-INR
INR: 1.4
Prothrombin Time: 17.3 seconds — ABNORMAL HIGH (ref 11.4–15.2)

## 2015-08-26 LAB — DIRECT ANTIGLOBULIN TEST (NOT AT ARMC)
DAT, IgG: NEGATIVE
DAT, complement: NEGATIVE

## 2015-08-26 LAB — LIPID PANEL
Cholesterol: 80 mg/dL (ref 0–200)
HDL: 26 mg/dL — ABNORMAL LOW (ref >40)
LDL Cholesterol: 34 mg/dL (ref 0–99)
Total CHOL/HDL Ratio: 3.1 RATIO
Triglycerides: 98 mg/dL (ref <150)
VLDL: 20 mg/dL (ref 0–40)

## 2015-08-26 MED ORDER — CALCIUM CARBONATE ANTACID 500 MG PO CHEW
2.0000 | CHEWABLE_TABLET | Freq: Three times a day (TID) | ORAL | Status: DC
  Administered 2015-08-26 – 2015-09-04 (×26): 400 mg via ORAL
  Filled 2015-08-26 (×27): qty 2

## 2015-08-26 MED ORDER — SODIUM CHLORIDE 0.9 % IV SOLN
1.0000 g | Freq: Once | INTRAVENOUS | Status: AC
  Administered 2015-08-26: 1 g via INTRAVENOUS
  Filled 2015-08-26: qty 10

## 2015-08-26 NOTE — Care Management Note (Signed)
Case Management Note  Patient Details  Name: [name] MRN: [id number] Date of Birth: 10/31/1965  Subjective/Objective:  Pt admitted with hand pain, nausea and vomiting                  Action/Plan:  Pt is from home - caregiver for 2 children.  Pt does not have insurance  Insurance underwriter- Financial Counselor following for Set designerpotential medicaid application.   Expected Discharge Date:                  Expected Discharge Plan:     In-House Referral:     Discharge planning Services  CM Consult  Post Acute Care Choice:    Choice offered to:     DME Arranged:    DME Agency:     HH Arranged:    HH Agency:     Status of Service:  In process, will continue to follow  If discussed at Long Length of Stay Meetings, dates discussed:    Additional Comments: 08/26/15 [facility] - CM on discharging unit will initiate this prior to discharge.  Pt may also need MATCH letter.  [name], Hernan Turnage S, RN 08/26/2015, 11:03 AM

## 2015-08-26 NOTE — Progress Notes (Signed)
PROGRESS NOTE    [name]  ULA:[id number] DOB: 09-11-1965 DOA: 08/23/2015 PCP: [name], MD   Brief Narrative:   [name] is a 50 y.o. BF PMHx Intracerebral hemorrhage, Stroke DM type II uncontrolled with complications, S/P right BKA, HTN    Present emergency room chief complaint weakness. Patient states that over the last 4 week she's been becoming increasingly weak. She states that gradually she's also developed a feeling of lack of sensation in her extremities. This is specially gotten worse over the last week. Patient states that prior to this she didn't have any odd sensations of weakness or numbness. Patient states that things came to head when she got so weak the day of admission she could not get out of bed. For the last several days patient's children have helped her get around the house.  Patient complains of increasing anxiety over these issue. Positive for orthopnea. Denies sick contacts. Denies fever.  Patient does have a history of neuropathy. Due to cost she has not seen her physician in a year. She has also been off her medications for 1 year.   Subjective: 8/22 A/O 4 patient states lesions began on her lower left leg and spread from there all over body. Had a physician check lesions out early on 1 time but never returned for care. States has not been following up on her diabetes, hypertension. States began have pustular rash under breasts and new excoriated rash across the top of breasts prior to admission. All lesions itch/painful     Assessment & Plan:   Principal Problem:   Severe anemia Active Problems:   Hypertension   Diabetes mellitus ([place])   Acute renal failure (HCC)   Thrombocytopenia (HCC)   Pressure ulcer   UTI (lower urinary tract infection)   Anemia   Acute on chronic renal failure (HCC)   Hydronephrosis determined by ultrasound   Papular rash   Uncontrolled type 2 diabetes mellitus with complication (HCC)    Paresthesia   Anemia   -Most likely multifactorial to include CKD, autoimmune (MCTD), malignancy? -8/19 transfused 2 units PRBC -8/20 transfused 2 units PRBC  Recent Labs Lab 08/23/15 1407 08/24/15 1015 08/25/15 0015 08/25/15 0424 08/26/15 1132  HGB 4.2* 6.5* 7.3* 7.5* 7.4*   Thrombocytopenia -8/20 transfused 2 units platelets -Hematology/Dr. [name] following -Awaiting Bone Marrow Biopsy results -Awaiting serum autoimmune labs -Transfuse for Hgb <7 and platelets less than 10K or active bleeding. -Will need to transfuse platelets prior to skin biopsy  Acute on CKD -Last Cr= 1.04 in 2013 patient has not seen a physician other than 1 time for the lesions which started a year ago. Most likely creatinine worsening over time -Renal ultrasound bilateral Hydronephrosis results below.  -Sodium bicarbonate 150m/hr -Continue significant Hematuria - Post foley placement. -Strict in and out since admission -2.3 L Lab Results  Component Value Date   CREATININE 6.37 (H) 08/26/2015   CREATININE 7.03 (H) 08/25/2015   CREATININE 8.24 (H) 08/24/2015  -Nephrology consulted  Complicated UTI -Continue Levofloxacin 500 mg  q 48 hr for 7-14 days  Papular Rash/Dark plaque lesions covering entire body -Diabetic lesion vs autoimmune lesion vs malignancy vs calciphylaxis with fungal infection -On 8/23 contact surgery for biopsy of lesion -Empiric Diflucan -Empiric Diflucan 50 mg daily  Cardiomegaly  - Seen on CXR. -8/21 echocardiogram normal see results below  HTN -Controlled  Diabetes type 2 uncontrolled with complications -S/P right BKA -Last hemoglobin A1c 11/2010= 12.4 -Hemoglobin A1c pending -Lipid panel pending -  Resistant SSI  Weakness/Numbness/Tingling - No focal deficit.  -Most likely secondary to chronically severely uncontrolled diabetes -After patient more stable may require Neurology consult for EMG  Insomnia - Mild. -Trazodone qhs prn    DVT  prophylaxis: SCD Code Status: Full Family Communication: None Disposition Plan: ??   Consultants:  Dr. Ladell Pier oncology [facility]    Procedures/Significant Events:  CXR PA/LAT 8/19: Questionable small left pleural effusion. No focal consolidation appreciated. Silhouetting of left hemidiaphragm. Cardiomegaly. Renal U/S 8/20: Diffusely distended bladder & moderate bilateral hydronephrosis. Normal renal size and echogenicity. LLE Duplex 8/20:  No DVT 8/21 Echocardiogram: -- Left ventricle: mild LVH. -LVEF = 60% to 65%.   LABS: CRP:  5.1 ESR:  108 C3 >> C4 >> IgG >>  SSA >>  SSB >>  A-Smith Ab >>  A-Jo Ab >>  ANA >>  ENA >>  Aldolase >>  CK:  269 SPEP >>  TSH:  5.461 Free T4:  0.79  Cultures 8/19 blood right hand/wrist NGTD  8/19 urine positive Multiple species 8/20 negative MRSA by PCR  8/21 Bone Marrow Bx pending  Antimicrobials: Ceftriaxone x1 08/23/15 Levaquin 8/19 >> Fluconazole 8/19 >>   Devices    LINES / TUBES:  Foley 8/20 >> PIV x2    Continuous Infusions: .  sodium bicarbonate 150 mEq in sterile water 1000 mL infusion 150 mEq (08/26/15 1900)     Objective: Vitals:   08/26/15 1247 08/26/15 1500 08/26/15 1600 08/26/15 1952  BP:   117/67 136/73  Pulse:   90 90  Resp:   18 (!) 23  Temp: 97.4 F (36.3 C)  97.7 F (36.5 C) 98.1 F (36.7 C)  TempSrc: Oral  Oral Oral  SpO2:   97% 100%  Weight:  106.8 kg (235 lb 8 oz)    Height:  '5\' 7"'  (1.702 m)      Intake/Output Summary (Last 24 hours) at 08/26/15 2030 Last data filed at 08/26/15 1952  Gross per 24 hour  Intake             3560 ml  Output             6020 ml  Net            -2460 ml   Filed Weights   08/25/15 0436 08/26/15 0900 08/26/15 1500  Weight: 107.1 kg (236 lb 1.8 oz) 107.3 kg (236 lb 8.9 oz) 106.8 kg (235 lb 8 oz)    Examination:  General: A/O 4, distress secondary to ongoing bleeding/skin lesions, No acute respiratory distress Eyes:  negative scleral hemorrhage, negative anisocoria, negative icterus ENT: Negative Runny nose, negative gingival bleeding, Neck:  Negative scars, masses, torticollis, lymphadenopathy, JVD Lungs: Clear to auscultation bilaterally without wheezes or crackles Cardiovascular: Regular rate and rhythm without murmur gallop or rub normal S1 and S2 Abdomen: Morbidly obese, negative abdominal pain, nondistended, positive soft, bowel sounds, no rebound, no ascites, no appreciable mass Extremities/Skin: right BKA, left lower extremity edema 1-2+. Patient with multiple black plaques all over extremities. Pruritic/painful. These lesions or all over torso face upper extremities. Underneath left breast area where skin/lesion has become ulcerous. All across breasts rough slightly raised rash across her chest. Psychiatric:  Negative depression, negative anxiety, negative fatigue, negative mania  Central nervous system:  Cranial nerves II through XII intact, tongue/uvula midline, all extremities muscle strength 5/5, sensation intact throughout, negative dysarthria, negative expressive aphasia, negative receptive aphasia.  .     Data Reviewed: Care  during the described time interval was provided by me .  I have reviewed this patient's available data, including medical history, events of note, physical examination, and all test results as part of my evaluation. I have personally reviewed and interpreted all radiology studies.  CBC:  Recent Labs Lab 08/23/15 1407 08/24/15 1015 08/25/15 0015 08/25/15 0424 08/26/15 1132  WBC 11.4* 15.1* 9.1 9.4 8.4  NEUTROABS 9.2* 13.0* 7.1 7.1 6.5  HGB 4.2* 6.5* 7.3* 7.5* 7.4*  HCT 13.6* 19.9* 22.3* 22.7* 22.6*  MCV 81.9 84.3 84.5 84.4 85.0  PLT 10* 18* 26* 29* 32*   Basic Metabolic Panel:  Recent Labs Lab 08/23/15 1407 08/24/15 1015 08/25/15 0424 08/26/15 0434  NA 133* 138 141 142  K 4.3 4.2 3.9 3.6  CL 111 116* 115* 110  CO2 9* 9* 13* 22  GLUCOSE 93 110* 79 80   BUN 94* 90* 80* 73*  CREATININE 8.78* 8.24* 7.03* 6.37*  CALCIUM 7.2* 6.9* 6.5* 6.1*  MG  --  2.0  --  1.6*  PHOS  --  7.6*  --  5.4*   GFR: Estimated Creatinine Clearance: 13.6 mL/min (by C-G formula based on SCr of 6.37 mg/dL). Liver Function Tests:  Recent Labs Lab 08/23/15 1407 08/24/15 1015 08/26/15 0434  AST 12* 13*  --   ALT 10* 11*  --   ALKPHOS 74 70  --   BILITOT 1.0 0.8  --   PROT 7.0 6.6  --   ALBUMIN 2.9* 2.5* 2.2*   No results for input(s): LIPASE, AMYLASE in the last 168 hours. No results for input(s): AMMONIA in the last 168 hours. Coagulation Profile:  Recent Labs Lab 08/23/15 1446 08/24/15 1015 08/25/15 0424 08/26/15 1132  INR 1.49 1.56 1.41 1.40   Cardiac Enzymes:  Recent Labs Lab 08/24/15 0119  CKTOTAL 269*   BNP (last 3 results) No results for input(s): PROBNP in the last 8760 hours. HbA1C: No results for input(s): HGBA1C in the last 72 hours. CBG:  Recent Labs Lab 08/25/15 1715 08/25/15 2207 08/26/15 0909 08/26/15 1240 08/26/15 1629  GLUCAP 99 89 89 92 129*   Lipid Profile:  Recent Labs  08/24/15 1015  CHOL 90  HDL 28*  LDLCALC 41  TRIG 107  CHOLHDL 3.2   Thyroid Function Tests:  Recent Labs  08/24/15 0119  TSH 5.461*  FREET4 0.79   Anemia Panel:  Recent Labs  08/23/15 2059  VITAMINB12 360  FERRITIN 178   Urine analysis:    Component Value Date/Time   COLORURINE YELLOW 08/23/2015 1630   APPEARANCEUR TURBID (A) 08/23/2015 1630   LABSPEC 1.014 08/23/2015 1630   PHURINE 6.0 08/23/2015 1630   GLUCOSEU NEGATIVE 08/23/2015 1630   HGBUR MODERATE (A) 08/23/2015 1630   BILIRUBINUR NEGATIVE 08/23/2015 1630   KETONESUR NEGATIVE 08/23/2015 1630   PROTEINUR 100 (A) 08/23/2015 1630   UROBILINOGEN 2.0 (H) 07/08/2011 0051   NITRITE NEGATIVE 08/23/2015 1630   LEUKOCYTESUR LARGE (A) 08/23/2015 1630   Sepsis Labs: '@LABRCNTIP' (procalcitonin:4,lacticidven:4)  ) Recent Results (from the past 240 hour(s))  Urine  culture     Status: Abnormal   Collection Time: 08/23/15  4:30 PM  Result Value Ref Range Status   Specimen Description URINE, RANDOM  Final   Special Requests NONE  Final   Culture MULTIPLE SPECIES PRESENT, SUGGEST RECOLLECTION (A)  Final   Report Status 08/24/2015 FINAL  Final  Culture, blood (routine x 2)     Status: None (Preliminary result)   Collection Time: 08/23/15  8:58 PM  Result Value Ref Range Status   Specimen Description BLOOD RIGHT HAND  Final   Special Requests BOTTLES DRAWN AEROBIC ONLY 5CC  Final   Culture NO GROWTH 3 DAYS  Final   Report Status PENDING  Incomplete  Culture, blood (routine x 2)     Status: None (Preliminary result)   Collection Time: 08/23/15  9:04 PM  Result Value Ref Range Status   Specimen Description BLOOD RIGHT WRIST  Final   Special Requests IN PEDIATRIC BOTTLE 3CC  Final   Culture NO GROWTH 3 DAYS  Final   Report Status PENDING  Incomplete  MRSA PCR Screening     Status: None   Collection Time: 08/24/15 12:58 AM  Result Value Ref Range Status   MRSA by PCR NEGATIVE NEGATIVE Final    Comment:        The GeneXpert MRSA Assay (FDA approved for NASAL specimens only), is one component of a comprehensive MRSA colonization surveillance program. It is not intended to diagnose MRSA infection nor to guide or monitor treatment for MRSA infections.          Radiology Studies: Ct Biopsy  Result Date: September 23, 2015 INDICATION: 50 year old female with a history of thrombus cytopenia. She has been referred for bone marrow biopsy EXAM: CT BIOPSY MEDICATIONS: None. ANESTHESIA/SEDATION: Moderate (conscious) sedation was employed during this procedure. A total of Versed 1.0 mg and Fentanyl 50 mcg was administered intravenously. Moderate Sedation Time: 10 minutes. The patient's level of consciousness and vital signs were monitored continuously by radiology nursing throughout the procedure under my direct supervision. FLUOROSCOPY TIME:  CT COMPLICATIONS:  None PROCEDURE: The procedure risks, benefits, and alternatives were explained to the patient. Questions regarding the procedure were encouraged and answered. The patient understands and consents to the procedure. Scout CT of the pelvis was performed for surgical planning purposes. The posterior pelvis was prepped with Betadinein a sterile fashion, and a sterile drape was applied covering the operative field. A sterile gown and sterile gloves were used for the procedure. Local anesthesia was provided with 1% Lidocaine. We targeted the right posterior iliac bone for biopsy. The skin and subcutaneous tissues were infiltrated with 1% lidocaine without epinephrine. A small stab incision was made with an 11 blade scalpel, and an 11 gauge Murphy needle was advanced with CT guidance to the posterior cortex. Manual forced was used to advance the needle through the posterior cortex and the stylet was removed. A bone marrow aspirate was retrieved and passed to a cytotechnologist in the room. The Murphy needle was then advanced without the stylet for a core biopsy. The core biopsy was retrieved and also passed to a cytotechnologist. Manual pressure was used for hemostasis and a sterile dressing was placed. No complications were encountered no significant blood loss was encountered. Patient tolerated the procedure well and remained hemodynamically stable throughout. IMPRESSION: Status post CT-guided bone marrow biopsy, with tissue specimen sent to pathology for complete histopathologic analysis Signed, Dulcy Fanny. Earleen Newport, DO [facility] Radiology Electronically Signed   By: Corrie Mckusick D.O.   On: September 23, 2015 10:42        Scheduled Meds: . calcium carbonate  2 tablet Oral TID  . fluconazole (DIFLUCAN) IV  50 mg Intravenous Q24H  . insulin aspart  0-20 Units Subcutaneous TID WC  . insulin aspart  0-5 Units Subcutaneous QHS  . levofloxacin (LEVAQUIN) IV  500 mg Intravenous  Q48H   Continuous Infusions: .  sodium bicarbonate 150  mEq in sterile water 1000 mL infusion 150 mEq (08/26/15 1900)     LOS: 3 days    Time spent: 40 minutes    Charnay Nazario, Geraldo Docker, MD [facility] Pager [phone]   If 7PM-7AM, please contact night-coverage [email or web address] Password [email or web address] 08/26/2015, 8:30 PM

## 2015-08-26 NOTE — Plan of Care (Signed)
Problem: Skin Integrity: Goal: Risk for impaired skin integrity will decrease Outcome: Not Progressing Patient with multiple ulcerations/sores over entire body. WOC consulted. MD aware and following.  Problem: Activity: Goal: Risk for activity intolerance will decrease Outcome: Progressing Patient used wheelchair at home at baseline.  Problem: Nutrition: Goal: Adequate nutrition will be maintained Outcome: Progressing Patient states she's had a poor appetite, but feels it's slightly improving.

## 2015-08-26 NOTE — Progress Notes (Signed)
Admission note:   Arrival Method: Transfer from 2S.  Mental Status: A&Ox4.  Skin: Patient has multiple ulcerations/lesions/wounds all over body, including her face. Some of the areas are draining/weeping. Large raised area noted under left breast. Interdry under both breasts for moisture wicking. Foam dressing intact to sacrum; full thickness wound noted and [facility]. Tubes: Foley catheter in place. Bloody urine noted, MD aware. IV: LAC and LH PIV. Sodium bicarb infusing through [facility]. Pain: Denies.  Family: No one at bedside. Living Situation: From home.  Safety Measures: Oriented to unit and surroundings. Call bell within reach. Instructed to call for assistance.   Leanna BattlesEckelmann, Kaylyn Garrow Eileen, RN.

## 2015-08-26 NOTE — Progress Notes (Signed)
Pharmacy Antibiotic Note  Cheryl HansenMichelle L Garlington is a 50 y.o. female admitted on 08/23/2015 with pain in hands/feet, rash on chest.  Pharmacy has been consulted for Levaquin and Fluconazole dosing She has profound anemia/thrombocytopenia. Hematology with concern for systemic infection vs. Hematopoietic malignancy. Pt in acute renal failure. Pt is afebrile with improved WBC now. Pt still is taking mostly IV formulations so will continue IV antibiotics for now. Awaiting results from bone marrow exam.  Plan: Levaquin 500 mg IV q48h Fluconazole 50 mg IV q24h Trend WBC, temp, renal function  F/U infectious/oncologic work-up   Temp (24hrs), Avg:98.1 F (36.7 C), Min:97.8 F (36.6 C), Max:98.5 F (36.9 C)   Recent Labs Lab 08/23/15 1407 08/24/15 0119 08/24/15 1015 08/25/15 0015 08/25/15 0424 08/26/15 0434  WBC 11.4*  --  15.1* 9.1 9.4  --   CREATININE 8.78*  --  8.24*  --  7.03* 6.37*  LATICACIDVEN  --  0.8 1.0  --   --   --     CrCl cannot be calculated (Unknown ideal weight.).    No Known Allergies  Arlean Hoppingorey M. Newman PiesBall, PharmD, BCPS Clinical Pharmacist Pager [phone] 08/26/2015 10:00 AM

## 2015-08-26 NOTE — Progress Notes (Signed)
IP PROGRESS NOTE  Subjective:  No new complaint.  Objective: Vital signs in last 24 hours: Blood pressure 93/70, pulse 81, temperature 97.4 F (36.3 C), temperature source Oral, resp. rate 10, weight 236 lb 8.9 oz (107.3 kg), SpO2 97 %.  Intake/Output from previous day: 08/21 0701 - 08/22 0700 In: 2625 [I.V.:2500; IV Piggyback:125] Out: 4081 [Urine:4110; Emesis/NG output:200]  Physical Exam:  HEENT: No thrush or bleeding Abdomen: Nontender, no hepatosplenomegaly Skin: Multiple crusted hyperpigmented lesions over the extremities, several pustular lesions over the face and neck. Ulcerated lesion at the left upper outer breast Neurologic: The motor exam is intact in the arms/hands and left leg/foot  Lab Results:  Recent Labs  08/25/15 0424 08/26/15 1132  WBC 9.4 8.4  HGB 7.5* 7.4*  HCT 22.7* 22.6*  PLT 29* 32*    BMET  Recent Labs  08/25/15 0424 08/26/15 0434  NA 141 142  K 3.9 3.6  CL 115* 110  CO2 13* 22  GLUCOSE 79 80  BUN 80* 73*  CREATININE 7.03* 6.37*  CALCIUM 6.5* 6.1*    Studies/Results: Ct Biopsy  Result Date: 08/25/2015 INDICATION: 50 year old female with a history of thrombus cytopenia. She has been referred for bone marrow biopsy EXAM: CT BIOPSY MEDICATIONS: None. ANESTHESIA/SEDATION: Moderate (conscious) sedation was employed during this procedure. A total of Versed 1.0 mg and Fentanyl 50 mcg was administered intravenously. Moderate Sedation Time: 10 minutes. The patient's level of consciousness and vital signs were monitored continuously by radiology nursing throughout the procedure under my direct supervision. FLUOROSCOPY TIME:  CT COMPLICATIONS: None PROCEDURE: The procedure risks, benefits, and alternatives were explained to the patient. Questions regarding the procedure were encouraged and answered. The patient understands and consents to the procedure. Scout CT of the pelvis was performed for surgical planning purposes. The posterior pelvis was  prepped with Betadinein a sterile fashion, and a sterile drape was applied covering the operative field. A sterile gown and sterile gloves were used for the procedure. Local anesthesia was provided with 1% Lidocaine. We targeted the right posterior iliac bone for biopsy. The skin and subcutaneous tissues were infiltrated with 1% lidocaine without epinephrine. A small stab incision was made with an 11 blade scalpel, and an 11 gauge Murphy needle was advanced with CT guidance to the posterior cortex. Manual forced was used to advance the needle through the posterior cortex and the stylet was removed. A bone marrow aspirate was retrieved and passed to a cytotechnologist in the room. The Murphy needle was then advanced without the stylet for a core biopsy. The core biopsy was retrieved and also passed to a cytotechnologist. Manual pressure was used for hemostasis and a sterile dressing was placed. No complications were encountered no significant blood loss was encountered. Patient tolerated the procedure well and remained hemodynamically stable throughout. IMPRESSION: Status post CT-guided bone marrow biopsy, with tissue specimen sent to pathology for complete histopathologic analysis Signed, Dulcy Fanny. Earleen Newport, DO [facility] Radiology Electronically Signed   By: Corrie Mckusick D.O.   On: 08/25/2015 10:42    Medications: I have reviewed the patient's current medications.  Assessment/Plan:  1. Severe anemia and thrombocytopenia-improved following multiple transfusions  Bone marrow biopsy 08/25/2015-no evidence of a hematopoietic malignancy, granulomatous disease, or metastatic carcinoma. Hypercellular marrow 2. Renal failure/bilateral hydronephrosis-creatinine improved today, good urine output 3. Pustular/ulcerated skin lesions/decubitus ulcers 4. Yeast rash beneath the breasts and in the groin 5. Diabetes 6. Status post right below-knee amputation in 2012 7.  History  of "neuropathy "  She appears stable. The hemoglobin and platelet count are stable after transfusions on 08/24/2015. The bone marrow biopsy is nondiagnostic. I suspect she has a systemic inflammatory condition accounting for the hematologic findings. No source for infection has been identified other than the skin lesions and urine. Cultures remain negative to date. Recommendations: 1. Biopsy/culture of a skin lesion 2. Continue antibiotics, follow-up cultures 3. Consider workup for collagen vascular diseases if the hemoglobin and platelet count remain low 4. Check RBC folate   LOS: 3 days   Betsy Coder, MD   08/26/2015, 1:55 PM

## 2015-08-26 NOTE — Progress Notes (Signed)
Pt reports feeling as though she needs to void despite having foley catheter. Bladder scan done by RN revealing 900cc in bladder. Foley flushed with 30cc NS - still no urine flow. Dr. Vassie LollAlva with [facility] contacted at this time and made aware of situation. Will replace foley catheter per order.

## 2015-08-26 NOTE — Progress Notes (Signed)
S: CO tingling of hands.  Appetite good O:BP (!) 113/53 (BP Location: Right Arm)   Pulse 79   Temp 98.5 F (36.9 C) (Oral)   Resp 20   Wt 107.1 kg (236 lb 1.8 oz)   LMP  (LMP Unknown)   SpO2 97%   BMI 36.98 kg/m   Intake/Output Summary (Last 24 hours) at 08/26/15 5621 Last data filed at 08/26/15 0700  Gross per 24 hour  Intake             2500 ml  Output             4310 ml  Net            -1810 ml   Weight change:  HYQ:MVHQI and alert CVS:RRR Resp:Clear Abd:+ BS NTND Bladder not palpable Ext:Rt BKA.  No edema.  Mult chronic pustular lesions over arms/legs/torso NEURO: Awake and alert, Ox3  No asterixis GU urine in foley blood tinged   . fluconazole (DIFLUCAN) IV  50 mg Intravenous Q24H  . insulin aspart  0-20 Units Subcutaneous TID WC  . insulin aspart  0-5 Units Subcutaneous QHS  . levofloxacin (LEVAQUIN) IV  500 mg Intravenous Q48H   Ct Biopsy  Result Date: 08/25/2015 INDICATION: 50 year old female with a history of thrombus cytopenia. She has been referred for bone marrow biopsy EXAM: CT BIOPSY MEDICATIONS: None. ANESTHESIA/SEDATION: Moderate (conscious) sedation was employed during this procedure. A total of Versed 1.0 mg and Fentanyl 50 mcg was administered intravenously. Moderate Sedation Time: 10 minutes. The patient's level of consciousness and vital signs were monitored continuously by radiology nursing throughout the procedure under my direct supervision. FLUOROSCOPY TIME:  CT COMPLICATIONS: None PROCEDURE: The procedure risks, benefits, and alternatives were explained to the patient. Questions regarding the procedure were encouraged and answered. The patient understands and consents to the procedure. Scout CT of the pelvis was performed for surgical planning purposes. The posterior pelvis was prepped with Betadinein a sterile fashion, and a sterile drape was applied covering the operative field. A sterile gown and sterile gloves were used for the procedure. Local  anesthesia was provided with 1% Lidocaine. We targeted the right posterior iliac bone for biopsy. The skin and subcutaneous tissues were infiltrated with 1% lidocaine without epinephrine. A small stab incision was made with an 11 blade scalpel, and an 11 gauge Murphy needle was advanced with CT guidance to the posterior cortex. Manual forced was used to advance the needle through the posterior cortex and the stylet was removed. A bone marrow aspirate was retrieved and passed to a cytotechnologist in the room. The Murphy needle was then advanced without the stylet for a core biopsy. The core biopsy was retrieved and also passed to a cytotechnologist. Manual pressure was used for hemostasis and a sterile dressing was placed. No complications were encountered no significant blood loss was encountered. Patient tolerated the procedure well and remained hemodynamically stable throughout. IMPRESSION: Status post CT-guided bone marrow biopsy, with tissue specimen sent to pathology for complete histopathologic analysis Signed, Dulcy Fanny. Earleen Newport, DO [facility] Radiology Electronically Signed   By: Corrie Mckusick D.O.   On: 08/25/2015 10:42   BMET    Component Value Date/Time   NA 142 08/26/2015 0434   K 3.6 08/26/2015 0434   CL 110 08/26/2015 0434   CO2 22 08/26/2015 0434   GLUCOSE 80 08/26/2015 0434   BUN 73 (H) 08/26/2015 0434   CREATININE 6.37 (H) 08/26/2015 0434   CALCIUM  6.1 (LL) 08/26/2015 0434   GFRNONAA 7 (L) 08/26/2015 0434   GFRAA 8 (L) 08/26/2015 0434   CBC    Component Value Date/Time   WBC 9.4 08/25/2015 0424   RBC 2.69 (L) 08/25/2015 0424   HGB 7.5 (L) 08/25/2015 0424   HCT 22.7 (L) 08/25/2015 0424   PLT 29 (LL) 08/25/2015 0424   MCV 84.4 08/25/2015 0424   MCH 27.9 08/25/2015 0424   MCHC 33.0 08/25/2015 0424   RDW 20.1 (H) 08/25/2015 0424   LYMPHSABS 1.1 08/25/2015 0424   MONOABS 1.1 (H) 08/25/2015 0424   EOSABS 0.1 08/25/2015 0424    BASOSABS 0.0 08/25/2015 0424     Assessment:  1. Presumably ARF in setting of Bil hydronephrosis,  probable BOO as UO excellent after foley placement and Scr cont to decrease 2. Anemia/thrombocytopenia  SP BM bx.   3. DM 4.  Chronic diabetic skin lesions 5. Met acidosis on bicarb gtt, improved 6. Hypocalcemia, not uncommon in setting of ARF sec to obstruction  Plan: 1. Start PO calcium and give 1 amp IV Ca 2. Daily Scr 3. Await BM Bx results 4. Cont IV bicarb for 1 more day [name] T

## 2015-08-27 DIAGNOSIS — R319 Hematuria, unspecified: Secondary | ICD-10-CM

## 2015-08-27 LAB — COMPREHENSIVE METABOLIC PANEL
ALT: 9 U/L — ABNORMAL LOW (ref 14–54)
AST: 13 U/L — ABNORMAL LOW (ref 15–41)
Albumin: 2.2 g/dL — ABNORMAL LOW (ref 3.5–5.0)
Alkaline Phosphatase: 63 U/L (ref 38–126)
Anion gap: 10 (ref 5–15)
BUN: 60 mg/dL — ABNORMAL HIGH (ref 6–20)
CO2: 29 mmol/L (ref 22–32)
Calcium: 6 mg/dL — CL (ref 8.9–10.3)
Chloride: 104 mmol/L (ref 101–111)
Creatinine, Ser: 5.35 mg/dL — ABNORMAL HIGH (ref 0.44–1.00)
GFR calc Af Amer: 10 mL/min — ABNORMAL LOW (ref >60)
GFR calc non Af Amer: 9 mL/min — ABNORMAL LOW (ref >60)
Glucose, Bld: 130 mg/dL — ABNORMAL HIGH (ref 65–99)
Potassium: 3.3 mmol/L — ABNORMAL LOW (ref 3.5–5.1)
Sodium: 143 mmol/L (ref 135–145)
Total Bilirubin: 0.6 mg/dL (ref 0.3–1.2)
Total Protein: 5.4 g/dL — ABNORMAL LOW (ref 6.5–8.1)

## 2015-08-27 LAB — CBC WITH DIFFERENTIAL/PLATELET
Basophils Absolute: 0 10*3/uL (ref 0.0–0.1)
Basophils Relative: 0 %
Eosinophils Absolute: 0.1 10*3/uL (ref 0.0–0.7)
Eosinophils Relative: 1 %
HCT: 19 % — ABNORMAL LOW (ref 36.0–46.0)
Hemoglobin: 6.1 g/dL — CL (ref 12.0–15.0)
Lymphocytes Relative: 13 %
Lymphs Abs: 1.1 10*3/uL (ref 0.7–4.0)
MCH: 26.6 pg (ref 26.0–34.0)
MCHC: 31.1 g/dL (ref 30.0–36.0)
MCV: 85.6 fL (ref 78.0–100.0)
Monocytes Absolute: 0.7 10*3/uL (ref 0.1–1.0)
Monocytes Relative: 9 %
Neutro Abs: 6.3 10*3/uL (ref 1.7–7.7)
Neutrophils Relative %: 77 %
Platelets: 51 10*3/uL — ABNORMAL LOW (ref 150–400)
RBC: 2.22 MIL/uL — ABNORMAL LOW (ref 3.87–5.11)
RDW: 21.6 % — ABNORMAL HIGH (ref 11.5–15.5)
WBC: 8.2 10*3/uL (ref 4.0–10.5)

## 2015-08-27 LAB — MAGNESIUM: Magnesium: 1.4 mg/dL — ABNORMAL LOW (ref 1.7–2.4)

## 2015-08-27 LAB — PROTIME-INR
INR: 1.44
Prothrombin Time: 17.7 seconds — ABNORMAL HIGH (ref 11.4–15.2)

## 2015-08-27 LAB — GLUCOSE, CAPILLARY
Glucose-Capillary: 108 mg/dL — ABNORMAL HIGH (ref 65–99)
Glucose-Capillary: 110 mg/dL — ABNORMAL HIGH (ref 65–99)
Glucose-Capillary: 118 mg/dL — ABNORMAL HIGH (ref 65–99)
Glucose-Capillary: 133 mg/dL — ABNORMAL HIGH (ref 65–99)

## 2015-08-27 LAB — FOLATE RBC
Folate, Hemolysate: 236 ng/mL
Folate, RBC: 1135 ng/mL (ref >498)
Hematocrit: 20.8 % — ABNORMAL LOW (ref 34.0–46.6)

## 2015-08-27 LAB — PREPARE RBC (CROSSMATCH)

## 2015-08-27 MED ORDER — SODIUM CHLORIDE 0.9 % IV SOLN
1.0000 g | Freq: Once | INTRAVENOUS | Status: AC
  Administered 2015-08-27: 1 g via INTRAVENOUS
  Filled 2015-08-27: qty 10

## 2015-08-27 MED ORDER — MAGNESIUM SULFATE 2 GM/50ML IV SOLN
2.0000 g | Freq: Once | INTRAVENOUS | Status: AC
  Administered 2015-08-27: 2 g via INTRAVENOUS
  Filled 2015-08-27: qty 50

## 2015-08-27 MED ORDER — POTASSIUM CHLORIDE CRYS ER 20 MEQ PO TBCR
30.0000 meq | EXTENDED_RELEASE_TABLET | Freq: Once | ORAL | Status: AC
  Administered 2015-08-27: 30 meq via ORAL
  Filled 2015-08-27: qty 1

## 2015-08-27 MED ORDER — CETYLPYRIDINIUM CHLORIDE 0.05 % MT LIQD
7.0000 mL | Freq: Two times a day (BID) | OROMUCOSAL | Status: DC
  Administered 2015-08-28 – 2015-08-29 (×2): 7 mL via OROMUCOSAL

## 2015-08-27 MED ORDER — SODIUM CHLORIDE 0.45 % IV SOLN
INTRAVENOUS | Status: DC
  Administered 2015-08-27 – 2015-08-28 (×2): via INTRAVENOUS

## 2015-08-27 MED ORDER — POTASSIUM CHLORIDE CRYS ER 20 MEQ PO TBCR
40.0000 meq | EXTENDED_RELEASE_TABLET | Freq: Once | ORAL | Status: AC
  Administered 2015-08-27: 40 meq via ORAL
  Filled 2015-08-27: qty 2

## 2015-08-27 MED ORDER — SODIUM CHLORIDE 0.9 % IV SOLN: Freq: Once | INTRAVENOUS | Status: AC

## 2015-08-27 NOTE — Progress Notes (Signed)
S: Feeling better O:BP (!) 142/68 (BP Location: Right Arm)   Pulse 91   Temp 99.3 F (37.4 C) (Oral)   Resp 19   Ht '5\' 7"'  (1.702 m)   Wt 106.1 kg (233 lb 14.5 oz)   LMP  (LMP Unknown)   SpO2 99%   BMI 36.64 kg/m   Intake/Output Summary (Last 24 hours) at 08/27/15 0802 Last data filed at 08/27/15 0656  Gross per 24 hour  Intake             3620 ml  Output             3820 ml  Net             -200 ml   Weight change:  RNH:AFBXU and alert CVS:RRR Resp:Clear Abd:+ BS NTND Ext:Rt BKA.  No edema.  Mult chronic pustular lesions over arms/legs/torso NEURO: Awake and alert, Ox3  No asterixis GU urine in foley blood tinged that is less tinged   . calcium carbonate  2 tablet Oral TID  . fluconazole (DIFLUCAN) IV  50 mg Intravenous Q24H  . insulin aspart  0-20 Units Subcutaneous TID WC  . insulin aspart  0-5 Units Subcutaneous QHS  . levofloxacin (LEVAQUIN) IV  500 mg Intravenous Q48H   Ct Biopsy  Result Date: 08/25/2015 INDICATION: 50 year old female with a history of thrombus cytopenia. She has been referred for bone marrow biopsy EXAM: CT BIOPSY MEDICATIONS: None. ANESTHESIA/SEDATION: Moderate (conscious) sedation was employed during this procedure. A total of Versed 1.0 mg and Fentanyl 50 mcg was administered intravenously. Moderate Sedation Time: 10 minutes. The patient's level of consciousness and vital signs were monitored continuously by radiology nursing throughout the procedure under my direct supervision. FLUOROSCOPY TIME:  CT COMPLICATIONS: None PROCEDURE: The procedure risks, benefits, and alternatives were explained to the patient. Questions regarding the procedure were encouraged and answered. The patient understands and consents to the procedure. Scout CT of the pelvis was performed for surgical planning purposes. The posterior pelvis was prepped with Betadinein a sterile fashion, and a sterile drape was applied covering the operative field. A sterile gown and sterile gloves  were used for the procedure. Local anesthesia was provided with 1% Lidocaine. We targeted the right posterior iliac bone for biopsy. The skin and subcutaneous tissues were infiltrated with 1% lidocaine without epinephrine. A small stab incision was made with an 11 blade scalpel, and an 11 gauge Murphy needle was advanced with CT guidance to the posterior cortex. Manual forced was used to advance the needle through the posterior cortex and the stylet was removed. A bone marrow aspirate was retrieved and passed to a cytotechnologist in the room. The Murphy needle was then advanced without the stylet for a core biopsy. The core biopsy was retrieved and also passed to a cytotechnologist. Manual pressure was used for hemostasis and a sterile dressing was placed. No complications were encountered no significant blood loss was encountered. Patient tolerated the procedure well and remained hemodynamically stable throughout. IMPRESSION: Status post CT-guided bone marrow biopsy, with tissue specimen sent to pathology for complete histopathologic analysis Signed, Dulcy Fanny. Earleen Newport, DO [facility] Radiology Electronically Signed   By: Corrie Mckusick D.O.   On: 08/25/2015 10:42   BMET    Component Value Date/Time   NA 143 08/27/2015 0534   K 3.3 (L) 08/27/2015 0534   CL 104 08/27/2015 0534   CO2 29 08/27/2015 0534   GLUCOSE 130 (H) 08/27/2015 0534  BUN 60 (H) 08/27/2015 0534   CREATININE 5.35 (H) 08/27/2015 0534   CALCIUM 6.0 (LL) 08/27/2015 0534   GFRNONAA 9 (L) 08/27/2015 0534   GFRAA 10 (L) 08/27/2015 0534   CBC    Component Value Date/Time   WBC 8.2 08/27/2015 0534   RBC 2.22 (L) 08/27/2015 0534   HGB 6.1 (LL) 08/27/2015 0534   HCT 19.0 (L) 08/27/2015 0534   PLT PENDING 08/27/2015 0534   MCV 85.6 08/27/2015 0534   MCH 26.6 08/27/2015 0534   MCHC 31.1 08/27/2015 0534   RDW 21.6 (H) 08/27/2015 0534   LYMPHSABS PENDING 08/27/2015 0534   MONOABS PENDING  08/27/2015 0534   EOSABS PENDING 08/27/2015 0534   BASOSABS PENDING 08/27/2015 0534     Assessment:  1. Presumably ARF in setting of Bil hydronephrosis,  probable BOO as UO excellent after foley placement and Scr cont to decrease 2. Anemia/thrombocytopenia  SP BM bx.   3. DM 4.  Chronic diabetic skin lesions 5. Met acidosis on bicarb gtt, improved 6. Hypocalcemia, not uncommon in setting of ARF sec to obstruction 7. Hypomag  Getting replacement 8. Hypokalemia, received oral replacement Plan: 1. DC IV bicarb and change to 1/2NS 2. IV Calcium.  Cont po calcium 3. Daily labs 4. Rec blood transfusion Carter Kaman T

## 2015-08-27 NOTE — Progress Notes (Signed)
IP PROGRESS NOTE  Subjective:  No new complaint. No bleeding other than hematuria. She continues to have tingling and numbness in the hands.  Objective: Vital signs in last 24 hours: Blood pressure 106/90, pulse 84, temperature 98.2 F (36.8 C), temperature source Oral, resp. rate 20, height 5' 7" (1.702 m), weight 233 lb 14.5 oz (106.1 kg), SpO2 97 %.  Intake/Output from previous day: 08/22 0701 - 08/23 0700 In: 3745 [P.O.:1060; I.V.:2500; IV Piggyback:185] Out: 4220 [Urine:4220]  Physical Exam: Not performed today   Lab Results:  Recent Labs  08/26/15 1132 08/27/15 0534  WBC 8.4 8.2  HGB 7.4* 6.1*  HCT 22.6* 19.0*  PLT 32* 51*    BMET  Recent Labs  08/26/15 0434 08/27/15 0534  NA 142 143  K 3.6 3.3*  CL 110 104  CO2 22 29  GLUCOSE 80 130*  BUN 73* 60*  CREATININE 6.37* 5.35*  CALCIUM 6.1* 6.0*    Studies/Results: No results found.  Medications: I have reviewed the patient's current medications.  Assessment/Plan:  1. Severe anemia and thrombocytopenia-improved following multiple transfusions  Bone marrow biopsy 08/25/2015-no evidence of a hematopoietic malignancy, granulomatous disease, or metastatic carcinoma. Hypercellular marrow 2. Renal failure/bilateral hydronephrosis-creatinine improved today, good urine output 3. Pustular/ulcerated skin lesions/decubitus ulcers 4. Yeast rash beneath the breasts and in the groin 5. Diabetes 6. Status post right below-knee amputation in 2012 7. History of "neuropathy "  She appears stable. The platelet count appears to be recovering. The etiology of the severe anemia/thrombocytopenia on admission remains unclear. The anemia is likely in part related to renal failure. I agree with the red cell transfusion today. We can consider adding erythropoietin therapy if Dr. Mattingly agrees. I think it would be appropriate to proceed with a skin biopsy to look for evidence of an infection or leg and  see.  Recommendations: 1. Biopsy/culture of a skin lesion 2. Continue antibiotics    LOS: 4 days   SHERRILL, GARY, MD   08/27/2015, 11:32 AM  

## 2015-08-27 NOTE — Progress Notes (Signed)
[facility] TEAM 1 - Stepdown/ICU TEAM  [name]  YJE:[id number] DOB: 16-Sep-1965 DOA: 08/23/2015 PCP: [name], MD    Brief Narrative:  50 y.o.F Hx Intracerebral hemorrhage, Stroke, DM2, S/P right BKA, and HTN who presented to the ED for weakness progressive over 4 weeks she's been becoming increasingly weak.  She had not seen her physician in a year. She had also been off her medications for 1 year.  Subjective: The patient is resting comfortably in her bed.  She denies shortness of breath nausea vomiting or abdominal pain.  She has no new complaints today.  Assessment & Plan:  Severe Anemia w/o evidence of acute blood loss  -multifactorial to include CKD, autoimmune (MCTD), malignancy? -s/p 4U PRBC thus far - to get another today  -Transfuse as needed to keep hemoglobin 7.0 or greater -Hematology is following with [place]  Thrombocytopenia -8/20 transfused 2 units platelets -Hematology following -Awaiting Bone Marrow Biopsy results -Awaiting serum autoimmune labs -Transfuse as needed for platelets less than 10K or active bleeding  Acute on CKD -Cr 1.04 in 2013  -[place] noted mod B hydronephrosis, but this was in the setting of acute urinary retention - Foley catheter has since been placed and creatinine is slowly improving  Recent Labs Lab 08/23/15 1407 08/24/15 1015 08/25/15 0424 08/26/15 0434 08/27/15 0534  CREATININE 8.78* 8.24* 7.03* 6.37* 5.35*    UTI  -Continue Levofloxacin for 7 - urine culture not helpful   Hypokalemia  -replace judiciously and follow  Hypocalcemia  -replace and follow   Hypomagnesemia -replace and follow   Papular Rash / Dark plaque lesions covering entire body -Diabetic lesion vs autoimmune lesion vs malignancy vs calciphylaxis with fungal infection - this are reportedly chronic  -delay biopsy of skin lesions until plt count 100+ - not likely to add to acute tx plan  -empiric Diflucan trial initiated by Dr.  Sherral Hammers  HTN -Controlled  DM2 uncontrolled  -S/P right BKA -A1c pending - CBG currently controlled  Weakness / Numbness / Tingling- No focal deficit.  -Most likely secondary to chronically severely uncontrolled diabetes v/s profound anemia v/s hypocalcemia - follow clinically w/ correction of each issue   DVT prophylaxis: SCDs Code Status: FULL CODE Family Communication: no family present at time of exam  Disposition Plan:   Consultants:  Hematology   Procedures: none  Antimicrobials:  Ceftriaxone 8/19 Levaquin 8/19 > Diflucan 8/19 >  Objective: Blood pressure 106/90, pulse 84, temperature 98.2 F (36.8 C), temperature source Oral, resp. rate 20, height '5\' 7"'  (1.702 m), weight 106.1 kg (233 lb 14.5 oz), SpO2 97 %.  Intake/Output Summary (Last 24 hours) at 08/27/15 1200 Last data filed at 08/27/15 0816  Gross per 24 hour  Intake          3548.33 ml  Output             4200 ml  Net          -651.67 ml   Filed Weights   08/26/15 0900 08/26/15 1500 08/27/15 0500  Weight: 107.3 kg (236 lb 8.9 oz) 106.8 kg (235 lb 8 oz) 106.1 kg (233 lb 14.5 oz)    Examination: General: No acute respiratory distress Lungs: Clear to auscultation bilaterally without wheezes or crackles Cardiovascular: Regular rate and rhythm without murmur gallop or rub normal S1 and S2 Abdomen: Nontender, nondistended, soft, bowel sounds positive, no rebound, no ascites, no appreciable mass Extremities: No significant cyanosis, clubbing, or edema bilateral lower extremities  CBC:  Recent  Labs Lab 08/24/15 1015 08/25/15 0015 08/25/15 0424 08/26/15 1132 08/27/15 0534  WBC 15.1* 9.1 9.4 8.4 8.2  NEUTROABS 13.0* 7.1 7.1 6.5 6.3  HGB 6.5* 7.3* 7.5* 7.4* 6.1*  HCT 19.9* 22.3* 22.7* 22.6* 19.0*  MCV 84.3 84.5 84.4 85.0 85.6  PLT 18* 26* 29* 32* 51*   Basic Metabolic Panel:  Recent Labs Lab 08/23/15 1407 08/24/15 1015 08/25/15 0424 08/26/15 0434 08/27/15 0534  NA 133* 138 141 142 143   K 4.3 4.2 3.9 3.6 3.3*  CL 111 116* 115* 110 104  CO2 9* 9* 13* 22 29  GLUCOSE 93 110* 79 80 130*  BUN 94* 90* 80* 73* 60*  CREATININE 8.78* 8.24* 7.03* 6.37* 5.35*  CALCIUM 7.2* 6.9* 6.5* 6.1* 6.0*  MG  --  2.0  --  1.6* 1.4*  PHOS  --  7.6*  --  5.4*  --    GFR: Estimated Creatinine Clearance: 16.1 mL/min (by C-G formula based on SCr of 5.35 mg/dL).  Liver Function Tests:  Recent Labs Lab 08/23/15 1407 08/24/15 1015 08/26/15 0434 08/27/15 0534  AST 12* 13*  --  13*  ALT 10* 11*  --  9*  ALKPHOS 74 70  --  63  BILITOT 1.0 0.8  --  0.6  PROT 7.0 6.6  --  5.4*  ALBUMIN 2.9* 2.5* 2.2* 2.2*    Coagulation Profile:  Recent Labs Lab 08/23/15 1446 08/24/15 1015 08/25/15 0424 08/26/15 1132 08/27/15 0534  INR 1.49 1.56 1.41 1.40 1.44    Cardiac Enzymes:  Recent Labs Lab 08/24/15 0119  CKTOTAL 269*    HbA1C: Hgb A1c MFr Bld  Date/Time Value Ref Range Status  11/23/2010 12:51 AM 12.4 (H) <5.7 % Final    Comment:    (NOTE)                                                                       According to the ADA Clinical Practice Recommendations for 2011, when HbA1c is used as a screening test:  >=6.5%   Diagnostic of Diabetes Mellitus           (if abnormal result is confirmed) 5.7-6.4%   Increased risk of developing Diabetes Mellitus References:Diagnosis and Classification of Diabetes Mellitus,Diabetes XAJO,8786,76(HMCNO 1):S62-S69 and Standards of Medical Care in         Diabetes - 2011,Diabetes BSJG,2836,62 (Suppl 1):S11-S61.  11/22/2010 07:57 PM 11.8 (H) <5.7 % Final    Comment:    (NOTE)                                                                       According to the ADA Clinical Practice Recommendations for 2011, when HbA1c is used as a screening test:  >=6.5%   Diagnostic of Diabetes Mellitus           (if abnormal result is confirmed) 5.7-6.4%   Increased risk of developing Diabetes Mellitus References:Diagnosis and Classification of  Diabetes Mellitus,Diabetes HUTM,5465,03(TWSFK 1):S62-S69 and Standards of Medical  Care in         Diabetes - 2011,Diabetes Care,2011,34 (Suppl 1):S11-S61.    CBG:  Recent Labs Lab 08/26/15 0909 08/26/15 1240 08/26/15 1629 08/26/15 2213 08/27/15 0746  GLUCAP 89 92 129* 111* 108*    Recent Results (from the past 240 hour(s))  Urine culture     Status: Abnormal   Collection Time: 08/23/15  4:30 PM  Result Value Ref Range Status   Specimen Description URINE, RANDOM  Final   Special Requests NONE  Final   Culture MULTIPLE SPECIES PRESENT, SUGGEST RECOLLECTION (A)  Final   Report Status 08/24/2015 FINAL  Final  Culture, blood (routine x 2)     Status: None (Preliminary result)   Collection Time: 08/23/15  8:58 PM  Result Value Ref Range Status   Specimen Description BLOOD RIGHT HAND  Final   Special Requests BOTTLES DRAWN AEROBIC ONLY 5CC  Final   Culture NO GROWTH 3 DAYS  Final   Report Status PENDING  Incomplete  Culture, blood (routine x 2)     Status: None (Preliminary result)   Collection Time: 08/23/15  9:04 PM  Result Value Ref Range Status   Specimen Description BLOOD RIGHT WRIST  Final   Special Requests IN PEDIATRIC BOTTLE 3CC  Final   Culture NO GROWTH 3 DAYS  Final   Report Status PENDING  Incomplete  MRSA PCR Screening     Status: None   Collection Time: 08/24/15 12:58 AM  Result Value Ref Range Status   MRSA by PCR NEGATIVE NEGATIVE Final    Comment:        The GeneXpert MRSA Assay (FDA approved for NASAL specimens only), is one component of a comprehensive MRSA colonization surveillance program. It is not intended to diagnose MRSA infection nor to guide or monitor treatment for MRSA infections.      Scheduled Meds: . calcium carbonate  2 tablet Oral TID  . fluconazole (DIFLUCAN) IV  50 mg Intravenous Q24H  . insulin aspart  0-20 Units Subcutaneous TID WC  . insulin aspart  0-5 Units Subcutaneous QHS  . levofloxacin (LEVAQUIN) IV  500 mg  Intravenous Q48H   Continuous Infusions: . sodium chloride 75 mL/hr at 08/27/15 0817     LOS: 4 days   Cherene Altes, MD [facility] Office  [phone] Pager - Text Page per Shea Evans as per below:  On-Call/Text Page:      [email or web address]      password [email or web address]  If 7PM-7AM, please contact night-coverage [email or web address] Password [email or web address] 08/27/2015, 12:00 PM

## 2015-08-27 NOTE — Progress Notes (Signed)
CRITICAL VALUE ALERT  Critical value received:  Calcium 6.0  Date of notification:  08/27/15  Time of notification:  Approx. [id number]  Critical value read back:Yes.    Nurse who received alert:  Beryle QuantMatt Elysabeth Aust, RN  MD notified (1st page):  Eula ListenK. Kirby-Graham of [facility]  Time of first page:  Approx. [phone]  MD notified (2nd page):  Time of second page:  Responding MD:  Eula ListenK. Kirby-Graham of [facility]  Time MD responded:  0645-orders received

## 2015-08-27 NOTE — Care Management Note (Addendum)
Case Management Note  Patient Details  Name: Cheryl HansenMichelle L Wolf MRN: [id number] Date of Birth: 10/07/1965  Subjective/Objective:    Patient is form home with her kids, she has an old R BKA, she states the prosthesis she had did not work to well for her.  She presents with worsening nerve pain, weakness and numbness and tingling, and rash all over body.  She does not have a pcp, she has a rolling walker and a w/chair at home.  She states she needs some therapy at home but unable to pay for it privately, she states her mom will be helping her to look into getting some insurance.  NCM informed her when she does get insurance , she should let her primary md know to set up HHPT for her .  NCM informed her that [facility] , [facility] over flow for [facility], also she could get medication ast from [facility] if needed.  She will need transportation at dc.  NCM will cont to follow for dc needs.                Action/Plan:   Expected Discharge Date:                  Expected Discharge Plan:  Home w Home Health Services  In-House Referral:     Discharge planning Services  CM Consult  Post Acute Care Choice:    Choice offered to:     DME Arranged:    DME Agency:     HH Arranged:    HH Agency:     Status of Service:  In process, will continue to follow  If discussed at Long Length of Stay Meetings, dates discussed:    Additional Comments:  Leone Havenaylor, Lakia Gritton [place], RN 08/27/2015, 12:10 PM

## 2015-08-27 NOTE — Progress Notes (Signed)
CRITICAL VALUE ALERT  Critical value received:  Hgb 6.1  Date of notification:  08/27/15  Time of notification:  0730  Critical value read back:Yes.    Nurse who received alert:  Hortense Ramalybill Hetty Linhart, RN  MD notified (1st page):  Dr. Sharon SellerMcClung  Time of first page:  0740  MD notified (2nd page): Dr. Briant CedarMattingly  Time of second page: 0806  Responding MD:  Dr. Briant CedarMattingly on the unit and notified. Dr. Sharon SellerMcClung placed orders for blood transfusion.    Leanna BattlesEckelmann, Iara Monds Eileen, RN.

## 2015-08-27 NOTE — Progress Notes (Signed)
Patient not usually O2 dependent, but was requiring some O2 via n/c during the night. This RN removed patient's nasal cannula and allowed her to remain on RA. Approx 5-10 minutes later, pt's O2 sats dropped to low/mid 80's. Patient was placed back on 2L O2 n/c and sats increased to mid 90's. Dr. Sharon SellerMcClung notified.  Leanna BattlesEckelmann, Anjana Cheek Eileen, RN.

## 2015-08-28 DIAGNOSIS — G609 Hereditary and idiopathic neuropathy, unspecified: Secondary | ICD-10-CM

## 2015-08-28 DIAGNOSIS — R531 Weakness: Secondary | ICD-10-CM

## 2015-08-28 DIAGNOSIS — R51 Headache: Secondary | ICD-10-CM

## 2015-08-28 DIAGNOSIS — L814 Other melanin hyperpigmentation: Secondary | ICD-10-CM

## 2015-08-28 DIAGNOSIS — N823 Fistula of vagina to large intestine: Secondary | ICD-10-CM

## 2015-08-28 DIAGNOSIS — L988 Other specified disorders of the skin and subcutaneous tissue: Secondary | ICD-10-CM

## 2015-08-28 LAB — CBC WITH DIFFERENTIAL/PLATELET
Basophils Absolute: 0 10*3/uL (ref 0.0–0.1)
Basophils Relative: 0 %
Eosinophils Absolute: 0.1 10*3/uL (ref 0.0–0.7)
Eosinophils Relative: 1 %
HCT: 22.6 % — ABNORMAL LOW (ref 36.0–46.0)
Hemoglobin: 7.2 g/dL — ABNORMAL LOW (ref 12.0–15.0)
Lymphocytes Relative: 17 %
Lymphs Abs: 1.7 10*3/uL (ref 0.7–4.0)
MCH: 28.2 pg (ref 26.0–34.0)
MCHC: 31.9 g/dL (ref 30.0–36.0)
MCV: 88.6 fL (ref 78.0–100.0)
Monocytes Absolute: 1.2 10*3/uL — ABNORMAL HIGH (ref 0.1–1.0)
Monocytes Relative: 13 %
Neutro Abs: 6.8 10*3/uL (ref 1.7–7.7)
Neutrophils Relative %: 69 %
Platelets: 55 10*3/uL — ABNORMAL LOW (ref 150–400)
RBC: 2.55 MIL/uL — ABNORMAL LOW (ref 3.87–5.11)
RDW: 20 % — ABNORMAL HIGH (ref 11.5–15.5)
WBC: 9.8 10*3/uL (ref 4.0–10.5)

## 2015-08-28 LAB — TYPE AND SCREEN
ABO/RH(D): B POS
Antibody Screen: NEGATIVE
Unit division: 0

## 2015-08-28 LAB — GLUCOSE, CAPILLARY
Glucose-Capillary: 123 mg/dL — ABNORMAL HIGH (ref 65–99)
Glucose-Capillary: 97 mg/dL (ref 65–99)
Glucose-Capillary: 128 mg/dL — ABNORMAL HIGH (ref 65–99)
Glucose-Capillary: 121 mg/dL — ABNORMAL HIGH (ref 65–99)

## 2015-08-28 LAB — COMPREHENSIVE METABOLIC PANEL
ALT: 9 U/L — ABNORMAL LOW (ref 14–54)
AST: 14 U/L — ABNORMAL LOW (ref 15–41)
Albumin: 2.4 g/dL — ABNORMAL LOW (ref 3.5–5.0)
Alkaline Phosphatase: 59 U/L (ref 38–126)
Anion gap: 9 (ref 5–15)
BUN: 50 mg/dL — ABNORMAL HIGH (ref 6–20)
CO2: 30 mmol/L (ref 22–32)
Calcium: 6.2 mg/dL — CL (ref 8.9–10.3)
Chloride: 103 mmol/L (ref 101–111)
Creatinine, Ser: 4.31 mg/dL — ABNORMAL HIGH (ref 0.44–1.00)
GFR calc Af Amer: 13 mL/min — ABNORMAL LOW (ref >60)
GFR calc non Af Amer: 11 mL/min — ABNORMAL LOW (ref >60)
Glucose, Bld: 107 mg/dL — ABNORMAL HIGH (ref 65–99)
Potassium: 4 mmol/L (ref 3.5–5.1)
Sodium: 142 mmol/L (ref 135–145)
Total Bilirubin: 0.6 mg/dL (ref 0.3–1.2)
Total Protein: 5.8 g/dL — ABNORMAL LOW (ref 6.5–8.1)

## 2015-08-28 LAB — CULTURE, BLOOD (ROUTINE X 2)
Culture: NO GROWTH
Culture: NO GROWTH

## 2015-08-28 LAB — MAGNESIUM: Magnesium: 1.7 mg/dL (ref 1.7–2.4)

## 2015-08-28 LAB — HEMOGLOBIN A1C
Hgb A1c MFr Bld: 5.3 % (ref 4.8–5.6)
Mean Plasma Glucose: 105 mg/dL

## 2015-08-28 MED ORDER — DIATRIZOATE MEGLUMINE & SODIUM 66-10 % PO SOLN
ORAL | Status: AC
  Administered 2015-08-28: 1000 mL
  Filled 2015-08-28: qty 30

## 2015-08-28 MED ORDER — SODIUM CHLORIDE 0.9 % IV SOLN: Freq: Once | INTRAVENOUS | Status: DC

## 2015-08-28 MED ORDER — SODIUM CHLORIDE 0.9 % IV SOLN
1.0000 g | Freq: Once | INTRAVENOUS | Status: AC
  Administered 2015-08-28: 1 g via INTRAVENOUS
  Filled 2015-08-28: qty 10

## 2015-08-28 NOTE — Progress Notes (Signed)
IP PROGRESS NOTE  Subjective:  She complains of a headache this morning. She continues to have numbness in the hands and left foot.  Objective: Vital signs in last 24 hours: Blood pressure 119/75, pulse 89, temperature 98.5 F (36.9 C), temperature source Oral, resp. rate (!) 21, height '5\' 7"'  (1.702 m), weight 237 lb 3.4 oz (107.6 kg), SpO2 100 %.  Intake/Output from previous day: 08/23 0701 - 08/24 0700 In: 3957.1 [P.O.:1180; I.V.:2287.1; Blood:365; IV Piggyback:125] Out: 4210 [Urine:5775]  Physical Exam:  HEENT-no thrush or bleeding Skin-numerous hyperpigmented crusted lesions over the extremities, ulcerated necrotic lesion at the upper outer left breast Resolving yeast rash beneath the breasts Abdomen: No hepatosplenomegaly   Lab Results:  Recent Labs  08/27/15 0534 08/28/15 0255  WBC 8.2 9.8  HGB 6.1* 7.2*  HCT 19.0* 22.6*  PLT 51* 55*    BMET  Recent Labs  08/27/15 0534 08/28/15 0255  NA 143 142  K 3.3* 4.0  CL 104 103  CO2 29 30  GLUCOSE 130* 107*  BUN 60* 50*  CREATININE 5.35* 4.31*  CALCIUM 6.0* 6.2*    Studies/Results: No results found.  Medications: I have reviewed the patient's current medications.  Assessment/Plan:  1. Severe anemia and thrombocytopenia-improved following multiple transfusions  Bone marrow biopsy 08/25/2015-no evidence of a hematopoietic malignancy, granulomatous disease, or metastatic carcinoma. Hypercellular marrow 2. Renal failure/bilateral hydronephrosis-creatinine improved today, good urine output 3. Pustular/ulcerated skin lesions/decubitus ulcers 4. Yeast rash beneath the breasts and in the groin 5. Diabetes 6. Status post right below-knee amputation in 2012 7. History of "neuropathy " 8. Coagulopathy  She appears stable. The hemoglobin is stable today and the platelet count has partially recovered. I suspect the hematologic findings are related to a systemic inflammatory condition, most likely an  infection.  Recommendations: 1. Biopsy/culture of a skin lesion, include biopsy of the lesion at the upper outer left breast 2. Continue antibiotics    LOS: 5 days   Betsy Coder, MD   08/28/2015, 2:05 PM

## 2015-08-28 NOTE — Progress Notes (Signed)
PROGRESS NOTE    [name]  ASN:[id number] DOB: January 25, 1965 DOA: 08/23/2015 PCP: [name], MD   Brief Narrative:   [name] is a 50 y.o. BF PMHx Intracerebral hemorrhage, Stroke DM type II uncontrolled with complications, S/P right BKA, HTN    Present emergency room chief complaint weakness. Patient states that over the last 4 week she's been becoming increasingly weak. She states that gradually she's also developed a feeling of lack of sensation in her extremities. This is specially gotten worse over the last week. Patient states that prior to this she didn't have any odd sensations of weakness or numbness. Patient states that things came to head when she got so weak the day of admission she could not get out of bed. For the last several days patient's children have helped her get around the house.  Patient complains of increasing anxiety over these issue. Positive for orthopnea. Denies sick contacts. Denies fever.  Patient does have a history of neuropathy. Due to cost she has not seen her physician in a year. She has also been off her medications for 1 year.   Subjective: 8/24 A/O 4 patient states lesions began on her lower left leg and spread from there all over body. Had a physician check lesions out early on 1 time but never returned for care. States has not been following up on her diabetes, hypertension. States began have pustular rash under breasts and new excoriated rash across the top of breasts prior to admission. All lesions itch/painful     Assessment & Plan:   Principal Problem:   Severe anemia Active Problems:   Hypertension   Diabetes mellitus ([facility])   Acute renal failure (HCC)   Thrombocytopenia (HCC)   Pressure ulcer   UTI (lower urinary tract infection)   Anemia   Acute on chronic renal failure (HCC)   Hydronephrosis determined by ultrasound   Papular rash   Uncontrolled type 2 diabetes mellitus with complication (HCC)    Paresthesia   Anemia   -Most likely multifactorial to include CKD, autoimmune (MCTD), malignancy? -8/19 transfused 2 units PRBC -8/20 transfused 2 units PRBC  Recent Labs Lab 08/25/15 0015 08/25/15 0424 08/26/15 1132 08/27/15 0534 08/28/15 0255  HGB 7.3* 7.5* 7.4* 6.1* 7.2*   Thrombocytopenia -8/20 transfused 2 units platelets -Hematology/Dr. [name] following -Awaiting Bone Marrow Biopsy results -Awaiting serum autoimmune labs -Transfuse for Hgb <7 and platelets less than 10K or active bleeding. -8/24 transfuse 2 units platelets Will need to transfuse platelets prior to skin biopsy  Acute on CKD -Last Cr= 1.04 in 2013 patient has not seen a physician other than 1 time for the lesions which started a year ago. Most likely creatinine worsening over time -Renal ultrasound bilateral Hydronephrosis results below.  - -Continue significant Hematuria - Post foley placement. -Strict in and out since admission - 6.2 L Lab Results  Component Value Date   CREATININE 4.31 (H) 08/28/2015   CREATININE 5.35 (H) 08/27/2015   CREATININE 6.37 (H) 08/26/2015  -Nephrology consulted  Complicated UTI -Continue Levofloxacin 500 mg  q 48 hr for 7-14 days  Papular Rash/Dark plaque lesions covering entire body -Diabetic lesion vs autoimmune lesion vs malignancy vs calciphylaxis with fungal infection -Empiric Diflucan -Empiric Diflucan 50 mg daily -On 8/24 contact surgery for biopsy of lesion  Cardiomegaly  - Seen on CXR. -8/21 echocardiogram normal see results below  HTN -Controlled  Diabetes type 2 uncontrolled with complications -S/P right BKA -Last hemoglobin A1c 11/2010= 12.4 -8/23 Hemoglobin  A1c = 5.3  -Lipid panel; within ADA guideline -Resistant SSI  Weakness/Numbness/Tingling - No focal deficit.  -Most likely secondary to chronically severely uncontrolled diabetes -After patient more stable may require Neurology consult for EMG  Rectovaginal fistula -Patient  appears to have stool exiting through vagina concern for rectovaginal fistula CT abdomen with oral contrast  Insomnia - Mild. -Trazodone qhs prn    DVT prophylaxis: SCD Code Status: Full Family Communication: None Disposition Plan: ??   Consultants:  Dr. Ladell Pier oncology Dr.Kwong [facility]    Procedures/Significant Events:  CXR PA/LAT 8/19: Questionable small left pleural effusion. No focal consolidation appreciated. Silhouetting of left hemidiaphragm. Cardiomegaly. Renal U/S 8/20: Diffusely distended bladder & moderate bilateral hydronephrosis. Normal renal size and echogenicity. LLE Duplex 8/20:  No DVT 8/19 transfused 2 units PRBC 8/20 transfused 2 units PRBC 8/20 transfused 2 units platelets 8/21 Echocardiogram: -- Left ventricle: mild LVH. -LVEF = 60% to 65%. 8/24 transfuse 2 units platelets   LABS: CRP:  5.1 ESR:  108 C3 >> C4 >> IgG >>  SSA >>  SSB >>  A-Smith Ab >>  A-Jo Ab >>  ANA >>  ENA >>  Aldolase >>  CK:  269 SPEP >>  TSH:  5.461 Free T4:  0.79    Cultures 8/19 blood right hand/wrist NGTD  8/19 urine positive Multiple species 8/20 negative MRSA by PCR  8/21 Bone Marrow Bx pending  Antimicrobials: Ceftriaxone x1 08/23/15 Levaquin 8/19 >> Fluconazole 8/19 >>   Devices    LINES / TUBES:  Foley 8/20 >> PIV x2    Continuous Infusions: . sodium chloride 75 mL/hr at 08/28/15 0102     Objective: Vitals:   08/27/15 1951 08/27/15 2244 08/28/15 0415 08/28/15 0500  BP: 128/70 124/81 111/69   Pulse: 97 94 89   Resp: (!) _0 Temp: 98.7 F (37.1 C) 98.3 F (36.8 C) 98.7 F (37.1 C)   TempSrc: Oral Oral Oral   SpO2: 99% 100% 100%   Weight:    107.6 kg (237 lb 3.4 oz)  Height:        Intake/Output Summary (Last 24 hours) at 08/28/15 0941 Last data filed at 08/28/15 0600  Gross per 24 hour  Intake          2818.75 ml  Output             4650 ml  Net         -1831.25 ml   Filed Weights   08/26/15  1500 08/27/15 0500 08/28/15 0500  Weight: 106.8 kg (235 lb 8 oz) 106.1 kg (233 lb 14.5 oz) 107.6 kg (237 lb 3.4 oz)    Examination:  General: A/O 4, distress secondary to ongoing bleeding/skin lesions, No acute respiratory distress Eyes: negative scleral hemorrhage, negative anisocoria, negative icterus ENT: Negative Runny nose, negative gingival bleeding, Neck:  Negative scars, masses, torticollis, lymphadenopathy, JVD Lungs: Clear to auscultation bilaterally without wheezes or crackles Cardiovascular: Regular rate and rhythm without murmur gallop or rub normal S1 and S2 Abdomen: Morbidly obese, negative abdominal pain, nondistended, positive soft, bowel sounds, no rebound, no ascites, no appreciable mass Extremities/Skin: right BKA, left lower extremity edema 1-2+. Patient with multiple black plaques all over extremities. Pruritic/painful. These lesions or all over torso face upper extremities. Underneath left breast area where skin/lesion has become ulcerous. All across breasts rough slightly raised rash across her chest. Psychiatric:  Negative depression, negative anxiety, negative fatigue, negative mania  Central nervous system:  Cranial nerves II through XII intact, tongue/uvula midline, all extremities muscle strength 5/5, sensation intact throughout, negative dysarthria, negative expressive aphasia, negative receptive aphasia.  .     Data Reviewed: Care during the described time interval was provided by me .  I have reviewed this patient's available data, including medical history, events of note, physical examination, and all test results as part of my evaluation. I have personally reviewed and interpreted all radiology studies.  CBC:  Recent Labs Lab 08/25/15 0015 08/25/15 0424 08/26/15 1132 08/26/15 1436 08/27/15 0534 08/28/15 0255  WBC 9.1 9.4 8.4  --  8.2 9.8  NEUTROABS 7.1 7.1 6.5  --  6.3 6.8  HGB 7.3* 7.5* 7.4*  --  6.1* 7.2*  HCT 22.3* 22.7* 22.6* 20.8* 19.0*  22.6*  MCV 84.5 84.4 85.0  --  85.6 88.6  PLT 26* 29* 32*  --  51* 55*   Basic Metabolic Panel:  Recent Labs Lab 08/24/15 1015 08/25/15 0424 08/26/15 0434 08/27/15 0534 08/28/15 0255  NA 138 141 142 143 142  K 4.2 3.9 3.6 3.3* 4.0  CL 116* 115* 110 104 103  CO2 9* 13* _0 GLUCOSE 110* 79 80 130* 107*  BUN 90* 80* 73* 60* 50*  CREATININE 8.24* 7.03* 6.37* 5.35* 4.31*  CALCIUM 6.9* 6.5* 6.1* 6.0* 6.2*  MG 2.0  --  1.6* 1.4* 1.7  PHOS 7.6*  --  5.4*  --   --    GFR: Estimated Creatinine Clearance: 20.2 mL/min (by C-G formula based on SCr of 4.31 mg/dL). Liver Function Tests:  Recent Labs Lab 08/23/15 1407 08/24/15 1015 08/26/15 0434 08/27/15 0534 08/28/15 0255  AST 12* 13*  --  13* 14*  ALT 10* 11*  --  9* 9*  ALKPHOS 74 70  --  63 59  BILITOT 1.0 0.8  --  0.6 0.6  PROT 7.0 6.6  --  5.4* 5.8*  ALBUMIN 2.9* 2.5* 2.2* 2.2* 2.4*   No results for input(s): LIPASE, AMYLASE in the last 168 hours. No results for input(s): AMMONIA in the last 168 hours. Coagulation Profile:  Recent Labs Lab 08/23/15 1446 08/24/15 1015 08/25/15 0424 08/26/15 1132 08/27/15 0534  INR 1.49 1.56 1.41 1.40 1.44   Cardiac Enzymes:  Recent Labs Lab 08/24/15 0119  CKTOTAL 269*   BNP (last 3 results) No results for input(s): PROBNP in the last 8760 hours. HbA1C: No results for input(s): HGBA1C in the last 72 hours. CBG:  Recent Labs Lab 08/27/15 0746 08/27/15 1309 08/27/15 1612 08/27/15 2121 08/28/15 0849  GLUCAP 108* 110* 118* 133* 123*   Lipid Profile:  Recent Labs  08/26/15 1132  CHOL 80  HDL 26*  LDLCALC 34  TRIG 98  CHOLHDL 3.1   Thyroid Function Tests: No results for input(s): TSH, T4TOTAL, FREET4, T3FREE, THYROIDAB in the last 72 hours. Anemia Panel: No results for input(s): VITAMINB12, FOLATE, FERRITIN, TIBC, IRON, RETICCTPCT in the last 72 hours. Urine analysis:    Component Value Date/Time   COLORURINE YELLOW 08/23/2015 1630   APPEARANCEUR  TURBID (A) 08/23/2015 1630   LABSPEC 1.014 08/23/2015 1630   PHURINE 6.0 08/23/2015 1630   GLUCOSEU NEGATIVE 08/23/2015 1630   HGBUR MODERATE (A) 08/23/2015 1630   BILIRUBINUR NEGATIVE 08/23/2015 1630   KETONESUR NEGATIVE 08/23/2015 1630   PROTEINUR 100 (A) 08/23/2015 1630   UROBILINOGEN 2.0 (H) 07/08/2011 0051   NITRITE NEGATIVE 08/23/2015 1630   LEUKOCYTESUR LARGE (A) 08/23/2015 1630   Sepsis Labs: _1 (procalcitonin:4,lacticidven:4)  ) Recent  Results (from the past 240 hour(s))  Urine culture     Status: Abnormal   Collection Time: 08/23/15  4:30 PM  Result Value Ref Range Status   Specimen Description URINE, RANDOM  Final   Special Requests NONE  Final   Culture MULTIPLE SPECIES PRESENT, SUGGEST RECOLLECTION (A)  Final   Report Status 08/24/2015 FINAL  Final  Culture, blood (routine x 2)     Status: None (Preliminary result)   Collection Time: 08/23/15  8:58 PM  Result Value Ref Range Status   Specimen Description BLOOD RIGHT HAND  Final   Special Requests BOTTLES DRAWN AEROBIC ONLY 5CC  Final   Culture NO GROWTH 4 DAYS  Final   Report Status PENDING  Incomplete  Culture, blood (routine x 2)     Status: None (Preliminary result)   Collection Time: 08/23/15  9:04 PM  Result Value Ref Range Status   Specimen Description BLOOD RIGHT WRIST  Final   Special Requests IN PEDIATRIC BOTTLE 3CC  Final   Culture NO GROWTH 4 DAYS  Final   Report Status PENDING  Incomplete  MRSA PCR Screening     Status: None   Collection Time: 08/24/15 12:58 AM  Result Value Ref Range Status   MRSA by PCR NEGATIVE NEGATIVE Final    Comment:        The GeneXpert MRSA Assay (FDA approved for NASAL specimens only), is one component of a comprehensive MRSA colonization surveillance program. It is not intended to diagnose MRSA infection nor to guide or monitor treatment for MRSA infections.          Radiology Studies: No results found.      Scheduled Meds: . antiseptic oral  rinse  7 mL Mouth Rinse BID  . calcium carbonate  2 tablet Oral TID  . calcium gluconate  1 g Intravenous Once  . fluconazole (DIFLUCAN) IV  50 mg Intravenous Q24H  . insulin aspart  0-20 Units Subcutaneous TID WC  . insulin aspart  0-5 Units Subcutaneous QHS  . levofloxacin (LEVAQUIN) IV  500 mg Intravenous Q48H   Continuous Infusions: . sodium chloride 75 mL/hr at 08/28/15 0102     LOS: 5 days    Time spent: 40 minutes    Jorey Dollard, Geraldo Docker, MD [facility] Pager [phone]   If 7PM-7AM, please contact night-coverage [email or web address] Password [facility] 08/28/2015, 9:41 AM

## 2015-08-28 NOTE — Progress Notes (Signed)
S: Some nausea this AM O:BP 111/69 (BP Location: Right Arm)   Pulse 89   Temp 98.7 F (37.1 C) (Oral)   Resp 20   Ht _0  (1.702 m)   Wt 107.6 kg (237 lb 3.4 oz)   LMP  (LMP Unknown)   SpO2 100%   BMI 37.15 kg/m   Intake/Output Summary (Last 24 hours) at 08/28/15 0740 Last data filed at 08/28/15 0600  Gross per 24 hour  Intake          3957.08 ml  Output             5775 ml  Net         -1817.92 ml   Weight change: 0.3 kg (10.6 oz) KKX:FGHWE and alert CVS:RRR Resp:Clear Abd:+ BS NTND Ext:Rt BKA.  No edema.  Mult chronic pustular lesions over arms/legs/torso NEURO: Awake and alert, Ox3  No asterixis GU urine in foley yellow   . antiseptic oral rinse  7 mL Mouth Rinse BID  . calcium carbonate  2 tablet Oral TID  . calcium gluconate  1 g Intravenous Once  . fluconazole (DIFLUCAN) IV  50 mg Intravenous Q24H  . insulin aspart  0-20 Units Subcutaneous TID WC  . insulin aspart  0-5 Units Subcutaneous QHS  . levofloxacin (LEVAQUIN) IV  500 mg Intravenous Q48H   No results found. BMET    Component Value Date/Time   NA 142 08/28/2015 0255   K 4.0 08/28/2015 0255   CL 103 08/28/2015 0255   CO2 30 08/28/2015 0255   GLUCOSE 107 (H) 08/28/2015 0255   BUN 50 (H) 08/28/2015 0255   CREATININE 4.31 (H) 08/28/2015 0255   CALCIUM 6.2 (LL) 08/28/2015 0255   GFRNONAA 11 (L) 08/28/2015 0255   GFRAA 13 (L) 08/28/2015 0255   CBC    Component Value Date/Time   WBC 9.8 08/28/2015 0255   RBC 2.55 (L) 08/28/2015 0255   HGB 7.2 (L) 08/28/2015 0255   HCT 22.6 (L) 08/28/2015 0255   HCT 20.8 (L) 08/26/2015 1436   PLT 55 (L) 08/28/2015 0255   MCV 88.6 08/28/2015 0255   MCH 28.2 08/28/2015 0255   MCHC 31.9 08/28/2015 0255   RDW 20.0 (H) 08/28/2015 0255   LYMPHSABS 1.7 08/28/2015 0255   MONOABS 1.2 (H) 08/28/2015 0255   EOSABS 0.1 08/28/2015 0255   BASOSABS 0.0 08/28/2015 0255     Assessment:  1. Presumably ARF in setting of Bil hydronephrosis,  probable BOO as UO excellent  after foley placement and Scr cont to decrease 2. Anemia/thrombocytopenia  ? Etiology   BM unrevealing  3. DM 4.  Chronic diabetic skin lesions 5. Met acidosis, improved 6. Hypocalcemia, not uncommon in setting of ARF sec to obstruction 7. Hypomag  improved 8. Hypokalemia, improved Plan: 1.  If renal failure acute then would have a hard time saying anemia related to renal ds plus this would not be associated with thrombocytopenia if it was the only cause.  Dr [name] is free to order an ESA if he feels it would be helpful as I am not opposed to it. 2. Daily Scr and will see where Scr eventually settles out 3.  Will get FU renal US next week 4. Note another gm of IV calcium ordered 5. I would get her up out of bed [place] T

## 2015-08-28 NOTE — Progress Notes (Signed)
CRITICAL VALUE ALERT  Critical value received:  Calcium 6.2  Date of notification:  08/28/15  Time of notification:  0620  Critical value read back:Yes.    Nurse who received alert:  Beryle QuantMatt Aniko Finnigan, RN  MD notified (1st page):  Eula ListenK. Kirby-Graham of [facility]  Time of first page:  0620  MD notified (2nd page):  Time of second page:  Responding MD:  Eula ListenK. Kirby-Graham of [facility]  Time MD responded:  0630- Replacement order received

## 2015-08-29 ENCOUNTER — Inpatient Hospital Stay (HOSPITAL_COMMUNITY): Payer: Medicaid Other

## 2015-08-29 DIAGNOSIS — N823 Fistula of vagina to large intestine: Secondary | ICD-10-CM

## 2015-08-29 DIAGNOSIS — E118 Type 2 diabetes mellitus with unspecified complications: Secondary | ICD-10-CM

## 2015-08-29 LAB — CBC WITH DIFFERENTIAL/PLATELET
Basophils Absolute: 0 10*3/uL (ref 0.0–0.1)
Basophils Relative: 0 %
Eosinophils Absolute: 0.1 10*3/uL (ref 0.0–0.7)
Eosinophils Relative: 1 %
HCT: 19.8 % — ABNORMAL LOW (ref 36.0–46.0)
Hemoglobin: 6.2 g/dL — CL (ref 12.0–15.0)
Lymphocytes Relative: 17 %
Lymphs Abs: 1.6 10*3/uL (ref 0.7–4.0)
MCH: 27.8 pg (ref 26.0–34.0)
MCHC: 31.3 g/dL (ref 30.0–36.0)
MCV: 88.8 fL (ref 78.0–100.0)
Monocytes Absolute: 0.7 10*3/uL (ref 0.1–1.0)
Monocytes Relative: 8 %
Neutro Abs: 7.1 10*3/uL (ref 1.7–7.7)
Neutrophils Relative %: 74 %
Platelets: 90 10*3/uL — ABNORMAL LOW (ref 150–400)
RBC: 2.23 MIL/uL — ABNORMAL LOW (ref 3.87–5.11)
RDW: 19.7 % — ABNORMAL HIGH (ref 11.5–15.5)
WBC: 9.5 10*3/uL (ref 4.0–10.5)

## 2015-08-29 LAB — RENAL FUNCTION PANEL
Albumin: 2.3 g/dL — ABNORMAL LOW (ref 3.5–5.0)
Anion gap: 10 (ref 5–15)
BUN: 40 mg/dL — ABNORMAL HIGH (ref 6–20)
CO2: 28 mmol/L (ref 22–32)
Calcium: 6.3 mg/dL — CL (ref 8.9–10.3)
Chloride: 102 mmol/L (ref 101–111)
Creatinine, Ser: 3.42 mg/dL — ABNORMAL HIGH (ref 0.44–1.00)
GFR calc Af Amer: 17 mL/min — ABNORMAL LOW (ref >60)
GFR calc non Af Amer: 15 mL/min — ABNORMAL LOW (ref >60)
Glucose, Bld: 81 mg/dL (ref 65–99)
Phosphorus: 3.6 mg/dL (ref 2.5–4.6)
Potassium: 3.8 mmol/L (ref 3.5–5.1)
Sodium: 140 mmol/L (ref 135–145)

## 2015-08-29 LAB — GLUCOSE, CAPILLARY
Glucose-Capillary: 84 mg/dL (ref 65–99)
Glucose-Capillary: 129 mg/dL — ABNORMAL HIGH (ref 65–99)
Glucose-Capillary: 152 mg/dL — ABNORMAL HIGH (ref 65–99)
Glucose-Capillary: 118 mg/dL — ABNORMAL HIGH (ref 65–99)

## 2015-08-29 LAB — MAGNESIUM: Magnesium: 1.4 mg/dL — ABNORMAL LOW (ref 1.7–2.4)

## 2015-08-29 MED ORDER — VITAMIN K1 10 MG/ML IJ SOLN
10.0000 mg | Freq: Two times a day (BID) | INTRAMUSCULAR | Status: AC
  Administered 2015-08-29 – 2015-08-30 (×3): 10 mg via SUBCUTANEOUS
  Filled 2015-08-29 (×3): qty 1

## 2015-08-29 MED ORDER — LIDOCAINE HCL (PF) 2 % IJ SOLN
0.0000 mL | Freq: Once | INTRAMUSCULAR | Status: DC | PRN
  Filled 2015-08-29: qty 20

## 2015-08-29 MED ORDER — MAGNESIUM SULFATE 2 GM/50ML IV SOLN
2.0000 g | Freq: Once | INTRAVENOUS | Status: AC
  Administered 2015-08-29: 2 g via INTRAVENOUS
  Filled 2015-08-29: qty 50

## 2015-08-29 MED ORDER — ORAL CARE MOUTH RINSE
15.0000 mL | Freq: Two times a day (BID) | OROMUCOSAL | Status: DC
  Administered 2015-08-30 – 2015-09-04 (×11): 15 mL via OROMUCOSAL

## 2015-08-29 NOTE — Progress Notes (Signed)
PROGRESS NOTE    [name]  QQP:[id number] DOB: [birth year] DOA: 08/23/2015 PCP: [name], MD   Brief Narrative:   [name] is a 50 y.o. BF PMHx Intracerebral hemorrhage, Stroke DM type II uncontrolled with complications, S/P right BKA, HTN    Present emergency room chief complaint weakness. Patient states that over the last 4 week she's been becoming increasingly weak. She states that gradually she's also developed a feeling of lack of sensation in her extremities. This is specially gotten worse over the last week. Patient states that prior to this she didn't have any odd sensations of weakness or numbness. Patient states that things came to head when she got so weak the day of admission she could not get out of bed. For the last several days patient's children have helped her get around the house.  Patient complains of increasing anxiety over these issue. Positive for orthopnea. Denies sick contacts. Denies fever.  Patient does have a history of neuropathy. Due to cost she has not seen her physician in a year. She has also been off her medications for 1 year.   Subjective: 8/25 A/O 4 patient states lesions began on her lower left leg and spread from there all over body. Had a physician check lesions out early on 1 time but never returned for care. States has not been following up on her diabetes, hypertension. States began have pustular rash under breasts and new excoriated rash across the top of breasts prior to admission. All lesions itch/painful     Assessment & Plan:   Principal Problem:   Severe anemia Active Problems:   Hypertension   Diabetes mellitus ([place])   Acute renal failure (HCC)   Thrombocytopenia (HCC)   Pressure ulcer   UTI (lower urinary tract infection)   Absolute anemia   Acute on chronic renal failure (HCC)   Hydronephrosis determined by ultrasound   Papular rash   Uncontrolled type 2 diabetes mellitus with complication ([place])  Paresthesia   Rectovaginal fistula   Controlled diabetes mellitus type 2 with complications ([place])   Anemia   -Most likely multifactorial to include CKD, autoimmune (MCTD), malignancy? -8/19 transfused 2 units PRBC -8/20 transfused 2 units PRBC Recent Labs Lab 08/25/15 0424 08/26/15 1132 08/27/15 0534 08/28/15 0255 08/29/15 0423  HGB 7.5* 7.4* 6.1* 7.2* 6.2*   Thrombocytopenia -8/20 transfused 2 units platelets -Hematology/Dr. [name] following -Awaiting Bone Marrow Biopsy results -Awaiting serum autoimmune labs -Transfuse for Hgb <7 and platelets less than 10K or active bleeding. -8/24 transfuse 2 units platelets Will need to transfuse platelets prior to skin biopsy  Acute on CKD -Last Cr= 1.04 in 2013 patient has not seen a physician other than 1 time for the lesions which started a year ago. Most likely creatinine worsening over time -Renal ultrasound bilateral Hydronephrosis results below.  - -Continue significant Hematuria - Post foley placement. -Strict in and out since admission - 5.0 L Lab Results  Component Value Date   CREATININE 3.42 (H) 08/29/2015   CREATININE 4.31 (H) 08/28/2015   CREATININE 5.35 (H) 08/27/2015  -Nephrology consulted  Complicated UTI -Continue Levofloxacin 500 mg  q 48 hr for 7-14 days  Papular Rash/Dark plaque lesions covering entire body -Diabetic lesion vs autoimmune lesion vs malignancy vs calciphylaxis with fungal infection -Empiric Diflucan -Empiric Diflucan 50 mg daily -On 8/24 contacted surgery for biopsy of lesion: We'll perform biopsy on 8/26  Cardiomegaly  - Seen on CXR. -8/21 echocardiogram normal see results below  HTN -Controlled  Hypomagnesemia -Magnesium IV 2 gm  Diabetes type 2 controlled with complications -S/P right BKA -Last hemoglobin A1c 11/2010= 12.4 -8/23 Hemoglobin A1c = 5.3  -Lipid panel; within ADA guideline -Resistant SSI  Weakness/Numbness/Tingling - No focal deficit.  -Most likely  secondary to chronically severely uncontrolled diabetes -After patient more stable may require Neurology consult for EMG  Rectovaginal fistula -CT abdomen pelvis with PO contrast: Shows possible rectovaginal fistula. -Per radiology Repeat CT pelvis with contrast per rectum pending.  Insomnia - Mild. -Trazodone qhs prn    DVT prophylaxis: SCD Code Status: Full Family Communication: None Disposition Plan: ??   Consultants:  Dr. Ladell Pier oncology Dr.Bric[facility] Lisette Abu [facility] consult    Procedures/Significant Events:  CXR PA/LAT 8/19: Questionable small left pleural effusion. No focal consolidation appreciated. Silhouetting of left hemidiaphragm. Cardiomegaly. Renal U/S 8/20: Diffusely distended bladder & moderate bilateral hydronephrosis. Normal renal size and echogenicity. LLE Duplex 8/20:  No DVT 8/19 transfused 2 units PRBC 8/20 transfused 2 units PRBC 8/20 transfused 2 units platelets 8/21 Echocardiogram: -- Left ventricle: mild LVH. -LVEF = 60% to 65%. 8/24 transfuse 2 units platelets  8/24 CT Abdomen Pelvis W/ PO Contrast; -Oral contrast did not reach the rectum and a clear rectovaginal fistula is not demonstrated. Preferably would repeat pelvis CT with contrast per rectum. -Persisting bilateral hydroureteronephrosis despite Foley catheter decompression of the inflamed bladder.  - Inflammatory changes in the pelvis but no discrete obstructing process seen. -Cardiomegaly with trace effusions and basilar atelectasis or edema.  LABS: CRP:  5.1 ESR:  108 C3 >> C4 >> IgG >>  SSA >>  SSB >>  A-Smith Ab >>  A-Jo Ab >>  ANA >>  ENA >>  Aldolase >>  CK:  269 SPEP >>  TSH:  5.461 Free T4:  0.79    Cultures 8/19 blood right hand/wrist NGTD  8/19 urine positive Multiple species 8/20 negative MRSA by PCR  8/21 Bone Marrow Bx pending  Antimicrobials: Ceftriaxone x1 08/23/15 Levaquin 8/19 >> Fluconazole 8/19  >>   Devices    LINES / TUBES:  Foley 8/20 >> PIV x2    Continuous Infusions:     Objective: Vitals:   08/29/15 0145 08/29/15 0220 08/29/15 0245 08/29/15 0721  BP: 133/85 131/76 138/80 122/67  Pulse: 87 92 92   Resp: _0 Temp: 98.5 F (36.9 C) 98.4 F (36.9 C) 98.6 F (37 C) 98.9 F (37.2 C)  TempSrc: Oral Oral Oral Oral  SpO2: 100% 96% 98% 99%  Weight:      Height:        Intake/Output Summary (Last 24 hours) at 08/29/15 0943 Last data filed at 08/29/15 0930  Gross per 24 hour  Intake             4358 ml  Output             5225 ml  Net             -867 ml   Filed Weights   08/26/15 1500 08/27/15 0500 08/28/15 0500  Weight: 106.8 kg (235 lb 8 oz) 106.1 kg (233 lb 14.5 oz) 107.6 kg (237 lb 3.4 oz)    Examination:  General: A/O 4, distress secondary to ongoing bleeding/skin lesions, No acute respiratory distress Eyes: negative scleral hemorrhage, negative anisocoria, negative icterus ENT: Negative Runny nose, negative gingival bleeding, Neck:  Negative scars, masses, torticollis, lymphadenopathy, JVD Lungs: Clear to auscultation bilaterally without wheezes or crackles  Cardiovascular: Regular rate and rhythm without murmur gallop or rub normal S1 and S2 Abdomen: Morbidly obese, negative abdominal pain, nondistended, positive soft, bowel sounds, no rebound, no ascites, no appreciable mass Extremities/Skin: right BKA, left lower extremity edema 1-2+. Patient with multiple black plaques all over extremities. Pruritic/painful. These lesions or all over torso face upper extremities. Underneath left breast area where skin/lesion has become ulcerous. All across breasts rough slightly raised rash across her chest. Psychiatric:  Negative depression, negative anxiety, negative fatigue, negative mania  Central nervous system:  Cranial nerves II through XII intact, tongue/uvula midline, all extremities muscle strength 5/5, sensation intact throughout, negative  dysarthria, negative expressive aphasia, negative receptive aphasia.  .     Data Reviewed: Care during the described time interval was provided by me .  I have reviewed this patient's available data, including medical history, events of note, physical examination, and all test results as part of my evaluation. I have personally reviewed and interpreted all radiology studies.  CBC:  Recent Labs Lab 08/25/15 0424 08/26/15 1132 08/26/15 1436 08/27/15 0534 08/28/15 0255 08/29/15 0423  WBC 9.4 8.4  --  8.2 9.8 9.5  NEUTROABS 7.1 6.5  --  6.3 6.8 7.1  HGB 7.5* 7.4*  --  6.1* 7.2* 6.2*  HCT 22.7* 22.6* 20.8* 19.0* 22.6* 19.8*  MCV 84.4 85.0  --  85.6 88.6 88.8  PLT 29* 32*  --  51* 55* 90*   Basic Metabolic Panel:  Recent Labs Lab 08/24/15 1015 08/25/15 0424 08/26/15 0434 08/27/15 0534 08/28/15 0255 08/29/15 0423  NA 138 141 142 143 142 140  K 4.2 3.9 3.6 3.3* 4.0 3.8  CL 116* 115* 110 104 103 102  CO2 9* 13* _0 GLUCOSE 110* 79 80 130* 107* 81  BUN 90* 80* 73* 60* 50* 40*  CREATININE 8.24* 7.03* 6.37* 5.35* 4.31* 3.42*  CALCIUM 6.9* 6.5* 6.1* 6.0* 6.2* 6.3*  MG 2.0  --  1.6* 1.4* 1.7 1.4*  PHOS 7.6*  --  5.4*  --   --  3.6   GFR: Estimated Creatinine Clearance: 25.4 mL/min (by C-G formula based on SCr of 3.42 mg/dL). Liver Function Tests:  Recent Labs Lab 08/23/15 1407 08/24/15 1015 08/26/15 0434 08/27/15 0534 08/28/15 0255 08/29/15 0423  AST 12* 13*  --  13* 14*  --   ALT 10* 11*  --  9* 9*  --   ALKPHOS 74 70  --  63 59  --   BILITOT 1.0 0.8  --  0.6 0.6  --   PROT 7.0 6.6  --  5.4* 5.8*  --   ALBUMIN 2.9* 2.5* 2.2* 2.2* 2.4* 2.3*   No results for input(s): LIPASE, AMYLASE in the last 168 hours. No results for input(s): AMMONIA in the last 168 hours. Coagulation Profile:  Recent Labs Lab 08/23/15 1446 08/24/15 1015 08/25/15 0424 08/26/15 1132 08/27/15 0534  INR 1.49 1.56 1.41 1.40 1.44   Cardiac Enzymes:  Recent Labs Lab  08/24/15 0119  CKTOTAL 269*   BNP (last 3 results) No results for input(s): PROBNP in the last 8760 hours. HbA1C:  Recent Labs  08/27/15 0534  HGBA1C 5.3   CBG:  Recent Labs Lab 08/28/15 0849 08/28/15 1303 08/28/15 1602 08/28/15 2101 08/29/15 0756  GLUCAP 123* 97 128* 121* 84   Lipid Profile:  Recent Labs  08/26/15 1132  CHOL 80  HDL 26*  LDLCALC 34  TRIG 98  CHOLHDL 3.1   Thyroid Function Tests: No  results for input(s): TSH, T4TOTAL, FREET4, T3FREE, THYROIDAB in the last 72 hours. Anemia Panel: No results for input(s): VITAMINB12, FOLATE, FERRITIN, TIBC, IRON, RETICCTPCT in the last 72 hours. Urine analysis:    Component Value Date/Time   COLORURINE YELLOW 08/23/2015 1630   APPEARANCEUR TURBID (A) 08/23/2015 1630   LABSPEC 1.014 08/23/2015 1630   PHURINE 6.0 08/23/2015 1630   GLUCOSEU NEGATIVE 08/23/2015 1630   HGBUR MODERATE (A) 08/23/2015 1630   BILIRUBINUR NEGATIVE 08/23/2015 1630   KETONESUR NEGATIVE 08/23/2015 1630   PROTEINUR 100 (A) 08/23/2015 1630   UROBILINOGEN 2.0 (H) 07/08/2011 0051   NITRITE NEGATIVE 08/23/2015 1630   LEUKOCYTESUR LARGE (A) 08/23/2015 1630   Sepsis Labs: _0 (procalcitonin:4,lacticidven:4)  ) Recent Results (from the past 240 hour(s))  Urine culture     Status: Abnormal   Collection Time: 08/23/15  4:30 PM  Result Value Ref Range Status   Specimen Description URINE, RANDOM  Final   Special Requests NONE  Final   Culture MULTIPLE SPECIES PRESENT, SUGGEST RECOLLECTION (A)  Final   Report Status 08/24/2015 FINAL  Final  Culture, blood (routine x 2)     Status: None   Collection Time: 08/23/15  8:58 PM  Result Value Ref Range Status   Specimen Description BLOOD RIGHT HAND  Final   Special Requests BOTTLES DRAWN AEROBIC ONLY 5CC  Final   Culture NO GROWTH 5 DAYS  Final   Report Status 08/28/2015 FINAL  Final  Culture, blood (routine x 2)     Status: None   Collection Time: 08/23/15  9:04 PM  Result Value Ref  Range Status   Specimen Description BLOOD RIGHT WRIST  Final   Special Requests IN PEDIATRIC BOTTLE 3CC  Final   Culture NO GROWTH 5 DAYS  Final   Report Status 08/28/2015 FINAL  Final  MRSA PCR Screening     Status: None   Collection Time: 08/24/15 12:58 AM  Result Value Ref Range Status   MRSA by PCR NEGATIVE NEGATIVE Final    Comment:        The GeneXpert MRSA Assay (FDA approved for NASAL specimens only), is one component of a comprehensive MRSA colonization surveillance program. It is not intended to diagnose MRSA infection nor to guide or monitor treatment for MRSA infections.          Radiology Studies: Ct Abdomen Pelvis Wo Contrast  Result Date: 08/29/2015 CLINICAL DATA:  Stool from vagina. EXAM: CT ABDOMEN AND PELVIS WITHOUT CONTRAST TECHNIQUE: Multidetector CT imaging of the abdomen and pelvis was performed following the standard protocol without IV contrast. COMPARISON:  Renal ultrasound 08/23/2015 FINDINGS: Lower chest and abdominal wall:  Body wall edema Trace layering pleural effusions. Interstitial opacities in the basilar lungs. Cardiomegaly and trace pericardial fluid Hepatobiliary: No focal liver abnormality.No evidence of biliary obstruction or stone. Pancreas: Unremarkable. Spleen: Upper limits of normal size. Adrenals/Urinary Tract: Negative adrenals. Bilateral hydroureteronephrosis to the level of the bladder, also seen on comparison sonogram. Interval decompression of the bladder with Foley catheter. Bladder wall is thickened and indistinct, as seen with cystitis. No visible pelvic mass or other certain explanation for ureteral obstruction. Stomach/Bowel: Oral contrast was provided and is seen to the level of the distal transverse colon. No contrast at the level of the rectum which is in close proximity to the vagina. Rectovaginal fistula cannot be excluded on this exam. No bowel obstruction. No appendicitis. Reproductive:Cannot exclude rectovaginal fistula as  above. Nonspecific stranding about the bladder and uterus. Vascular/Lymphatic: No acute vascular  abnormality. Mild reactive appearing enlargement of bilateral inguinal lymph nodes. Other: No ascites or pneumoperitoneum Musculoskeletal: No acute abnormalities. IMPRESSION: 1. Oral contrast did not reach the rectum and a clear rectovaginal fistula is not demonstrated. Preferably would repeat pelvis CT with contrast per rectum. 2. Persisting bilateral hydroureteronephrosis despite Foley catheter decompression of the inflamed bladder. There is inflammatory changes in the pelvis but no discrete obstructing process seen. 3. Cardiomegaly with trace effusions and basilar atelectasis or edema. Electronically Signed   By: Monte Fantasia M.D.   On: 08/29/2015 00:39        Scheduled Meds: . sodium chloride   Intravenous Once  . antiseptic oral rinse  7 mL Mouth Rinse BID  . calcium carbonate  2 tablet Oral TID  . fluconazole (DIFLUCAN) IV  50 mg Intravenous Q24H  . insulin aspart  0-20 Units Subcutaneous TID WC  . insulin aspart  0-5 Units Subcutaneous QHS  . levofloxacin (LEVAQUIN) IV  500 mg Intravenous Q48H   Continuous Infusions:     LOS: 6 days    Time spent: 40 minutes    Jachin Coury, Geraldo Docker, MD [facility] Pager [phone]   If 7PM-7AM, please contact night-coverage [email or web address] Password [facility] 08/29/2015, 9:43 AM

## 2015-08-29 NOTE — Consult Note (Signed)
Reason for Consult:Skin bx Referring Physician: Curtis Woods  [name] is an 49 y.o. female.  HPI: Consulted to see pt 2/2 numerous skin lesions of uncertain etiology that primary team would like biopsied. The first site is on the upper outer quadrant of the left breast. She thinks it has been there about 2 weeks. She has had other lesions in that area than occasionally drain and go away. It is painful. She also has multiple lesions over the rest of her body but mostly the extremities that have been present for about a year. They are not painful. She has another lesion on her right inner thigh the RN was concerned about; I was unable to visualize at this time but will examine it when we do the other biopsies. She is willing to proceed with the procedures.  Past Medical History:  Diagnosis Date  . Arthritis   . Diabetes mellitus   . Diabetic foot ulcer associated with type 2 diabetes mellitus (HCC)   . Hypertension   . Intracerebral hemorrhage (HCC) As a teenager    States she had burst blood vessel as teenager, now with resultant minor visual field and hearing deficits  . Neuropathy (HCC)   . Stroke (HCC)     Past Surgical History:  Procedure Laterality Date  . AMPUTATION  11/24/2010   Procedure: AMPUTATION FOOT;  Surgeon: John Hewitt, MD;  Location: [facility] OR;  Service: Orthopedics;  Laterality: Right;  Right  FOOT TRANS METATARSAL AMPUTATION  . AMPUTATION  07/02/2011   Procedure: AMPUTATION BELOW KNEE;  Surgeon: John Hewitt, MD;  Location: [facility] OR;  Service: Orthopedics;  Laterality: Right;  . APPLICATION OF WOUND VAC  07/02/2011   Procedure: APPLICATION OF WOUND VAC;  Surgeon: John Hewitt, MD;  Location: [facility] OR;  Service: Orthopedics;  Laterality: Right;  . BRAIN SURGERY  1980's   Previous brain surgery in [place], [place]  . CESAREAN SECTION    . I&D EXTREMITY  11/24/2010   Procedure: IRRIGATION AND DEBRIDEMENT EXTREMITY;  Surgeon: John Hewitt, MD;  Location: [facility] OR;  Service:  Orthopedics;  Laterality: Left;  Debriedment of left plantar ulcer and trimming of toenails  . I&D EXTREMITY  07/02/2011   Procedure: IRRIGATION AND DEBRIDEMENT EXTREMITY;  Surgeon: John Hewitt, MD;  Location: [facility];  Service: Orthopedics;  Laterality: Left;  I & D of Left Foot  . I&D EXTREMITY  07/06/2011   Procedure: IRRIGATION AND DEBRIDEMENT EXTREMITY;  Surgeon: John Hewitt, MD;  Location: [facility];  Service: Orthopedics;  Laterality: Right;  IRRIGATION/DEBRIDEMENT RIGHT BELOW KNEE AMPUTATION    Family History  Problem Relation Age of Onset  . Diabetes Mother   . Hypertension Mother   . Hyperlipidemia Mother   . Diabetes Father   . Cancer Father     prostate  . Hyperlipidemia Father   . Hypertension Father   . Diabetes Brother   . Peripheral Artery Disease Brother     has had toes amputated    Social History:  reports that she has never smoked. She has never used smokeless tobacco. She reports that she does not drink alcohol or use drugs.  Allergies: No Known Allergies  Medications: I have reviewed the patient's current medications.  Results for orders placed or performed during the hospital encounter of 08/23/15 (from the past 48 hour(s))  Glucose, capillary     Status: Abnormal   Collection Time: 08/27/15  4:12 PM  Result Value Ref Range   Glucose-Capillary 118 (H) 65 - 99 mg/dL    Glucose, capillary     Status: Abnormal   Collection Time: 08/27/15  9:21 PM  Result Value Ref Range   Glucose-Capillary 133 (H) 65 - 99 mg/dL  CBC with Differential/Platelet     Status: Abnormal   Collection Time: 08/28/15  2:55 AM  Result Value Ref Range   WBC 9.8 4.0 - 10.5 K/uL   RBC 2.55 (L) 3.87 - 5.11 MIL/uL   Hemoglobin 7.2 (L) 12.0 - 15.0 g/dL   HCT 22.6 (L) 36.0 - 46.0 %   MCV 88.6 78.0 - 100.0 fL   MCH 28.2 26.0 - 34.0 pg   MCHC 31.9 30.0 - 36.0 g/dL   RDW 20.0 (H) 11.5 - 15.5 %   Platelets 55 (L) 150 - 400 K/uL    Comment: SPECIMEN CHECKED FOR CLOTS REPEATED TO VERIFY CONSISTENT  WITH PREVIOUS RESULT    Neutrophils Relative % 69 %   Neutro Abs 6.8 1.7 - 7.7 K/uL   Lymphocytes Relative 17 %   Lymphs Abs 1.7 0.7 - 4.0 K/uL   Monocytes Relative 13 %   Monocytes Absolute 1.2 (H) 0.1 - 1.0 K/uL   Eosinophils Relative 1 %   Eosinophils Absolute 0.1 0.0 - 0.7 K/uL   Basophils Relative 0 %   Basophils Absolute 0.0 0.0 - 0.1 K/uL  Comprehensive metabolic panel     Status: Abnormal   Collection Time: 08/28/15  2:55 AM  Result Value Ref Range   Sodium 142 135 - 145 mmol/L   Potassium 4.0 3.5 - 5.1 mmol/L    Comment: DELTA CHECK NOTED   Chloride 103 101 - 111 mmol/L   CO2 30 22 - 32 mmol/L   Glucose, Bld 107 (H) 65 - 99 mg/dL   BUN 50 (H) 6 - 20 mg/dL   Creatinine, Ser 4.31 (H) 0.44 - 1.00 mg/dL   Calcium 6.2 (LL) 8.9 - 10.3 mg/dL    Comment: CRITICAL RESULT CALLED TO, READ BACK BY AND VERIFIED WITH: EVERETTE,N RN 08/28/2015 0617 JORDANS    Total Protein 5.8 (L) 6.5 - 8.1 g/dL   Albumin 2.4 (L) 3.5 - 5.0 g/dL   AST 14 (L) 15 - 41 U/L   ALT 9 (L) 14 - 54 U/L   Alkaline Phosphatase 59 38 - 126 U/L   Total Bilirubin 0.6 0.3 - 1.2 mg/dL   GFR calc non Af Amer 11 (L) >60 mL/min   GFR calc Af Amer 13 (L) >60 mL/min    Comment: (NOTE) The eGFR has been calculated using the CKD EPI equation. This calculation has not been validated in all clinical situations. eGFR's persistently <60 mL/min signify possible Chronic Kidney Disease.    Anion gap 9 5 - 15  Magnesium     Status: None   Collection Time: 08/28/15  2:55 AM  Result Value Ref Range   Magnesium 1.7 1.7 - 2.4 mg/dL  Glucose, capillary     Status: Abnormal   Collection Time: 08/28/15  8:49 AM  Result Value Ref Range   Glucose-Capillary 123 (H) 65 - 99 mg/dL   Comment 1 Notify RN    Comment 2 Document in Chart   Glucose, capillary     Status: None   Collection Time: 08/28/15  1:03 PM  Result Value Ref Range   Glucose-Capillary 97 65 - 99 mg/dL   Comment 1 Notify RN    Comment 2 Document in Chart     Glucose, capillary     Status: Abnormal   Collection Time: 08/28/15  4:02 PM  Result Value Ref Range   Glucose-Capillary 128 (H) 65 - 99 mg/dL   Comment 1 Notify RN    Comment 2 Document in Chart   Prepare Pheresed Platelets     Status: None (Preliminary result)   Collection Time: 08/28/15  5:23 PM  Result Value Ref Range   Unit Number [id number]    Blood Component Type PLTP LR2 PAS    Unit division 00    Status of Unit ISSUED    Transfusion Status OK TO TRANSFUSE    Unit Number [id number]    Blood Component Type PLTP LR3 PAS    Unit division 00    Status of Unit ISSUED    Transfusion Status OK TO TRANSFUSE   Glucose, capillary     Status: Abnormal   Collection Time: 08/28/15  9:01 PM  Result Value Ref Range   Glucose-Capillary 121 (H) 65 - 99 mg/dL  CBC with Differential/Platelet     Status: Abnormal   Collection Time: 08/29/15  4:23 AM  Result Value Ref Range   WBC 9.5 4.0 - 10.5 K/uL   RBC 2.23 (L) 3.87 - 5.11 MIL/uL   Hemoglobin 6.2 (LL) 12.0 - 15.0 g/dL    Comment: REPEATED TO VERIFY CRITICAL RESULT CALLED TO, READ BACK BY AND VERIFIED WITH: M YORK,RN AT 0650 08/29/15 BY K BARR    HCT 19.8 (L) 36.0 - 46.0 %   MCV 88.8 78.0 - 100.0 fL   MCH 27.8 26.0 - 34.0 pg   MCHC 31.3 30.0 - 36.0 g/dL   RDW 19.7 (H) 11.5 - 15.5 %   Platelets 90 (L) 150 - 400 K/uL    Comment: CONSISTENT WITH PREVIOUS RESULT   Neutrophils Relative % 74 %   Neutro Abs 7.1 1.7 - 7.7 K/uL   Lymphocytes Relative 17 %   Lymphs Abs 1.6 0.7 - 4.0 K/uL   Monocytes Relative 8 %   Monocytes Absolute 0.7 0.1 - 1.0 K/uL   Eosinophils Relative 1 %   Eosinophils Absolute 0.1 0.0 - 0.7 K/uL   Basophils Relative 0 %   Basophils Absolute 0.0 0.0 - 0.1 K/uL  Magnesium     Status: Abnormal   Collection Time: 08/29/15  4:23 AM  Result Value Ref Range   Magnesium 1.4 (L) 1.7 - 2.4 mg/dL  Renal function panel     Status: Abnormal   Collection Time: 08/29/15  4:23 AM  Result Value Ref Range   Sodium 140  135 - 145 mmol/L   Potassium 3.8 3.5 - 5.1 mmol/L   Chloride 102 101 - 111 mmol/L   CO2 28 22 - 32 mmol/L   Glucose, Bld 81 65 - 99 mg/dL   BUN 40 (H) 6 - 20 mg/dL   Creatinine, Ser 3.42 (H) 0.44 - 1.00 mg/dL   Calcium 6.3 (LL) 8.9 - 10.3 mg/dL    Comment: CRITICAL RESULT CALLED TO, READ BACK BY AND VERIFIED WITH: YORK,M RN 08/29/2015 0655 JORDANS    Phosphorus 3.6 2.5 - 4.6 mg/dL   Albumin 2.3 (L) 3.5 - 5.0 g/dL   GFR calc non Af Amer 15 (L) >60 mL/min   GFR calc Af Amer 17 (L) >60 mL/min    Comment: (NOTE) The eGFR has been calculated using the CKD EPI equation. This calculation has not been validated in all clinical situations. eGFR's persistently <60 mL/min signify possible Chronic Kidney Disease.    Anion gap 10 5 - 15  Glucose, capillary       Status: None   Collection Time: 08/29/15  7:56 AM  Result Value Ref Range   Glucose-Capillary 84 65 - 99 mg/dL   Comment 1 Notify RN    Comment 2 Document in Chart   Glucose, capillary     Status: Abnormal   Collection Time: 08/29/15 11:45 AM  Result Value Ref Range   Glucose-Capillary 129 (H) 65 - 99 mg/dL    Ct Abdomen Pelvis Wo Contrast  Result Date: 08/29/2015 CLINICAL DATA:  Stool from vagina. EXAM: CT ABDOMEN AND PELVIS WITHOUT CONTRAST TECHNIQUE: Multidetector CT imaging of the abdomen and pelvis was performed following the standard protocol without IV contrast. COMPARISON:  Renal ultrasound 08/23/2015 FINDINGS: Lower chest and abdominal wall:  Body wall edema Trace layering pleural effusions. Interstitial opacities in the basilar lungs. Cardiomegaly and trace pericardial fluid Hepatobiliary: No focal liver abnormality.No evidence of biliary obstruction or stone. Pancreas: Unremarkable. Spleen: Upper limits of normal size. Adrenals/Urinary Tract: Negative adrenals. Bilateral hydroureteronephrosis to the level of the bladder, also seen on comparison sonogram. Interval decompression of the bladder with Foley catheter. Bladder wall  is thickened and indistinct, as seen with cystitis. No visible pelvic mass or other certain explanation for ureteral obstruction. Stomach/Bowel: Oral contrast was provided and is seen to the level of the distal transverse colon. No contrast at the level of the rectum which is in close proximity to the vagina. Rectovaginal fistula cannot be excluded on this exam. No bowel obstruction. No appendicitis. Reproductive:Cannot exclude rectovaginal fistula as above. Nonspecific stranding about the bladder and uterus. Vascular/Lymphatic: No acute vascular abnormality. Mild reactive appearing enlargement of bilateral inguinal lymph nodes. Other: No ascites or pneumoperitoneum Musculoskeletal: No acute abnormalities. IMPRESSION: 1. Oral contrast did not reach the rectum and a clear rectovaginal fistula is not demonstrated. Preferably would repeat pelvis CT with contrast per rectum. 2. Persisting bilateral hydroureteronephrosis despite Foley catheter decompression of the inflamed bladder. There is inflammatory changes in the pelvis but no discrete obstructing process seen. 3. Cardiomegaly with trace effusions and basilar atelectasis or edema. Electronically Signed   By: Jonathon  Watts M.D.   On: 08/29/2015 00:39    Review of Systems  Skin: Positive for rash.  Neurological: Positive for sensory change.   Blood pressure 122/66, pulse 88, temperature 98.1 F (36.7 C), temperature source Oral, resp. rate 20, height 5' 7" (1.702 m), weight 107.6 kg (237 lb 3.4 oz), SpO2 97 %. Physical Exam  Skin: Lesion and rash noted. Rash is maculopapular.      Assessment/Plan: Skin lesions -- Plan punch bx of 2-3 lesions later today. Obtain consent.    Michael J. Jeffery, PA-C Pager: [phone] 08/29/2015, 1:20 PM  

## 2015-08-29 NOTE — Progress Notes (Signed)
S: Nausea better.  Taking fluids OK.  O:BP 138/80 (BP Location: Right Arm)   Pulse 92   Temp 98.6 F (37 C) (Oral)   Resp 15   Ht _0  (1.702 m)   Wt 107.6 kg (237 lb 3.4 oz)   LMP  (LMP Unknown)   SpO2 98%   BMI 37.15 kg/m   Intake/Output Summary (Last 24 hours) at 08/29/15 0705 Last data filed at 08/29/15 0600  Gross per 24 hour  Intake             3878 ml  Output             4425 ml  Net             -547 ml   Weight change:  CBS:WHQPR and alert CVS:RRR Resp:Clear Abd:+ BS NTND Ext:Rt BKA.  No edema.  Mult chronic pustular lesions over arms/legs/torso NEURO: Awake and alert, Ox3  No asterixis GU urine in foley yellow   . sodium chloride   Intravenous Once  . antiseptic oral rinse  7 mL Mouth Rinse BID  . calcium carbonate  2 tablet Oral TID  . fluconazole (DIFLUCAN) IV  50 mg Intravenous Q24H  . insulin aspart  0-20 Units Subcutaneous TID WC  . insulin aspart  0-5 Units Subcutaneous QHS  . levofloxacin (LEVAQUIN) IV  500 mg Intravenous Q48H   Ct Abdomen Pelvis Wo Contrast  Result Date: 08/29/2015 CLINICAL DATA:  Stool from vagina. EXAM: CT ABDOMEN AND PELVIS WITHOUT CONTRAST TECHNIQUE: Multidetector CT imaging of the abdomen and pelvis was performed following the standard protocol without IV contrast. COMPARISON:  Renal ultrasound 08/23/2015 FINDINGS: Lower chest and abdominal wall:  Body wall edema Trace layering pleural effusions. Interstitial opacities in the basilar lungs. Cardiomegaly and trace pericardial fluid Hepatobiliary: No focal liver abnormality.No evidence of biliary obstruction or stone. Pancreas: Unremarkable. Spleen: Upper limits of normal size. Adrenals/Urinary Tract: Negative adrenals. Bilateral hydroureteronephrosis to the level of the bladder, also seen on comparison sonogram. Interval decompression of the bladder with Foley catheter. Bladder wall is thickened and indistinct, as seen with cystitis. No visible pelvic mass or other certain explanation for  ureteral obstruction. Stomach/Bowel: Oral contrast was provided and is seen to the level of the distal transverse colon. No contrast at the level of the rectum which is in close proximity to the vagina. Rectovaginal fistula cannot be excluded on this exam. No bowel obstruction. No appendicitis. Reproductive:Cannot exclude rectovaginal fistula as above. Nonspecific stranding about the bladder and uterus. Vascular/Lymphatic: No acute vascular abnormality. Mild reactive appearing enlargement of bilateral inguinal lymph nodes. Other: No ascites or pneumoperitoneum Musculoskeletal: No acute abnormalities. IMPRESSION: 1. Oral contrast did not reach the rectum and a clear rectovaginal fistula is not demonstrated. Preferably would repeat pelvis CT with contrast per rectum. 2. Persisting bilateral hydroureteronephrosis despite Foley catheter decompression of the inflamed bladder. There is inflammatory changes in the pelvis but no discrete obstructing process seen. 3. Cardiomegaly with trace effusions and basilar atelectasis or edema. Electronically Signed   By: Monte Fantasia M.D.   On: 08/29/2015 00:39   BMET    Component Value Date/Time   NA 140 08/29/2015 0423   K 3.8 08/29/2015 0423   CL 102 08/29/2015 0423   CO2 28 08/29/2015 0423   GLUCOSE 81 08/29/2015 0423   BUN 40 (H) 08/29/2015 0423   CREATININE 3.42 (H) 08/29/2015 0423   CALCIUM 6.3 (LL) 08/29/2015 0423   GFRNONAA 15 (L) 08/29/2015 0423   GFRAA  17 (L) 08/29/2015 0423   CBC    Component Value Date/Time   WBC 9.5 08/29/2015 0423   RBC 2.23 (L) 08/29/2015 0423   HGB 6.2 (LL) 08/29/2015 0423   HCT 19.8 (L) 08/29/2015 0423   HCT 20.8 (L) 08/26/2015 1436   PLT 90 (L) 08/29/2015 0423   MCV 88.8 08/29/2015 0423   MCH 27.8 08/29/2015 0423   MCHC 31.3 08/29/2015 0423   RDW 19.7 (H) 08/29/2015 0423   LYMPHSABS 1.6 08/29/2015 0423   MONOABS 0.7 08/29/2015 0423   EOSABS 0.1 08/29/2015 0423   BASOSABS 0.0 08/29/2015 0423      Assessment:  1. Presumably ARF in setting of Bil hydronephrosis,  probable BOO as UO excellent after foley placement and Scr cont to decrease 2. Anemia/thrombocytopenia  ? Etiology   BM unrevealing  Plt ct improving.  Hg decreasing 3. DM 4.  Chronic diabetic skin lesions, nurse says she is scheduled for Bx 5. Met acidosis, improved 6. Hypocalcemia on oral replacement 7. Hypomag 8. Hypokalemia, improved Plan: 1.  Replace Mag 2.  Daily Scr 3. DC IV fluids and see if she can keep up orally Jaslin Novitski T

## 2015-08-29 NOTE — Progress Notes (Signed)
Charma IgoMichael Jeffery, PA, stated he will perform the biopsies tomorrow.

## 2015-08-29 NOTE — Progress Notes (Signed)
Pharmacy Antibiotic Note  Cheryl HansenMichelle L Ambrose is a 50 y.o. female admitted on 08/23/2015 with pain in hands/feet, rash on chest.  Pharmacy has been consulted for Levaquin and Fluconazole dosing - Day #7. She has profound anemia/thrombocytopenia, platelets improving. AKI improving, CrCl up to ~25. Afebrile, wbc wnl. To continue IV antibiotics for now. Awaiting results from skin biopsy.  Plan: Levaquin 500 mg IV q48h Fluconazole 50 mg IV q24h Trend WBC, temp, renal function  F/U infectious/oncologic work-up   Temp (24hrs), Avg:98.5 F (36.9 C), Min:98 F (36.7 C), Max:98.9 F (37.2 C)   Recent Labs Lab 08/24/15 0119 08/24/15 1015  08/25/15 0424 08/26/15 0434 08/26/15 1132 08/27/15 0534 08/28/15 0255 08/29/15 0423  WBC  --  15.1*  < > 9.4  --  8.4 8.2 9.8 9.5  CREATININE  --  8.24*  --  7.03* 6.37*  --  5.35* 4.31* 3.42*  LATICACIDVEN 0.8 1.0  --   --   --   --   --   --   --   < > = values in this interval not displayed.  Estimated Creatinine Clearance: 25.4 mL/min (by C-G formula based on SCr of 3.42 mg/dL).    No Known Allergies  Babs BertinHaley Gurtej Noyola, PharmD, [facility] Clinical Pharmacist Pager [phone] 08/29/2015 8:48 AM

## 2015-08-29 NOTE — Progress Notes (Addendum)
IP PROGRESS NOTE  Subjective: She complains of persistent numbness in the fingers.  Objective: Vital signs in last 24 hours: Blood pressure 122/67, pulse 92, temperature 98.9 F (37.2 C), temperature source Oral, resp. rate 15, height 5' 7" (1.702 m), weight 237 lb 3.4 oz (107.6 kg), SpO2 99 %.  Intake/Output from previous day: 08/24 0701 - 08/25 0700 In: 3878 [P.O.:1720; I.V.:1750; Blood:383; IV Piggyback:25] Out: 7829 [Urine:4425]  Physical Exam:  HEENT-no thrush or bleeding Skin-numerous hyperpigmented crusted lesions over the extremities, ulcerated necrotic lesion at the upper outer left breast Resolving yeast rash beneath the breasts, areas of hidradenitis in the axillae bilaterally Abdomen: No hepatosplenomegaly Lymph nodes: No cervical, supraclavicular, or axillary nodes   Lab Results:  Recent Labs  08/28/15 0255 08/29/15 0423  WBC 9.8 9.5  HGB 7.2* 6.2*  HCT 22.6* 19.8*  PLT 55* 90*    BMET  Recent Labs  08/28/15 0255 08/29/15 0423  NA 142 140  K 4.0 3.8  CL 103 102  CO2 30 28  GLUCOSE 107* 81  BUN 50* 40*  CREATININE 4.31* 3.42*  CALCIUM 6.2* 6.3*    Studies/Results: Ct Abdomen Pelvis Wo Contrast  Result Date: 08/29/2015 CLINICAL DATA:  Stool from vagina. EXAM: CT ABDOMEN AND PELVIS WITHOUT CONTRAST TECHNIQUE: Multidetector CT imaging of the abdomen and pelvis was performed following the standard protocol without IV contrast. COMPARISON:  Renal ultrasound 08/23/2015 FINDINGS: Lower chest and abdominal wall:  Body wall edema Trace layering pleural effusions. Interstitial opacities in the basilar lungs. Cardiomegaly and trace pericardial fluid Hepatobiliary: No focal liver abnormality.No evidence of biliary obstruction or stone. Pancreas: Unremarkable. Spleen: Upper limits of normal size. Adrenals/Urinary Tract: Negative adrenals. Bilateral hydroureteronephrosis to the level of the bladder, also seen on comparison sonogram. Interval decompression of the  bladder with Foley catheter. Bladder wall is thickened and indistinct, as seen with cystitis. No visible pelvic mass or other certain explanation for ureteral obstruction. Stomach/Bowel: Oral contrast was provided and is seen to the level of the distal transverse colon. No contrast at the level of the rectum which is in close proximity to the vagina. Rectovaginal fistula cannot be excluded on this exam. No bowel obstruction. No appendicitis. Reproductive:Cannot exclude rectovaginal fistula as above. Nonspecific stranding about the bladder and uterus. Vascular/Lymphatic: No acute vascular abnormality. Mild reactive appearing enlargement of bilateral inguinal lymph nodes. Other: No ascites or pneumoperitoneum Musculoskeletal: No acute abnormalities. IMPRESSION: 1. Oral contrast did not reach the rectum and a clear rectovaginal fistula is not demonstrated. Preferably would repeat pelvis CT with contrast per rectum. 2. Persisting bilateral hydroureteronephrosis despite Foley catheter decompression of the inflamed bladder. There is inflammatory changes in the pelvis but no discrete obstructing process seen. 3. Cardiomegaly with trace effusions and basilar atelectasis or edema. Electronically Signed   By: Monte Fantasia M.D.   On: 08/29/2015 00:39    Medications: I have reviewed the patient's current medications.  Assessment/Plan:  1. Severe anemia and thrombocytopenia-improved following multiple transfusions  Bone marrow biopsy 08/25/2015-no evidence of a hematopoietic malignancy, granulomatous disease, or metastatic carcinoma. Hypercellular marrow 2. Renal failure/bilateral hydronephrosis, urinary retention-creatinine improved today, good urine output  Etiology of urinary retention not established, potentially related to neuropathy 3. Pustular/ulcerated skin lesions/decubitus ulcers 4. Yeast rash beneath the breasts and in the groin-improved 5. Diabetes 6. Status post right below-knee amputation in  2012 7. History of "neuropathy " 8. Coagulopathy  She appears stable. The hemoglobin remains low, in part secondary to phlebotomy. The platelets are higher today  after a transfusion yesterday. I continue to feel the hematologic findings are most likely secondary to a systemic inflammatory condition, potentially infection. She may have associated low-grade DIC.  Recommendations: 1. Biopsy/culture of a skin lesion, include biopsy of the lesion at the upper outer left breast 2. Continue antibiotics 3. Consider GYN evaluation for possible rectovaginal fistula 4. Transfuse platelets for a count of less than 10,000 or bleeding 5.  Transfuse packed red blood cells 6.  Trial of vitamin K, follow-up PT/PTT 08/30/2015  I will be out until 09/03/2015. I will ask Dr. Marin Olp to follow her.    LOS: 6 days   Betsy Coder, MD   08/29/2015, 9:18 AM

## 2015-08-29 NOTE — Progress Notes (Signed)
Dellia NimsWilliam Jeffery PA paged to let him know we have supplies for the biopsies.

## 2015-08-29 NOTE — Progress Notes (Signed)
PROGRESS NOTE    [name]  URK:[id number] DOB: 11-17-1965 DOA: 08/23/2015 PCP: [name], MD   Brief Narrative:   [name] is a 50 y.o. BF PMHx Intracerebral hemorrhage, Stroke DM type II uncontrolled with complications, S/P right BKA, HTN    Present emergency room chief complaint weakness. Patient states that over the last 4 week she's been becoming increasingly weak. She states that gradually she's also developed a feeling of lack of sensation in her extremities. This is specially gotten worse over the last week. Patient states that prior to this she didn't have any odd sensations of weakness or numbness. Patient states that things came to head when she got so weak the day of admission she could not get out of bed. For the last several days patient's children have helped her get around the house.  Patient complains of increasing anxiety over these issue. Positive for orthopnea. Denies sick contacts. Denies fever.  Patient does have a history of neuropathy. Due to cost she has not seen her physician in a year. She has also been off her medications for 1 year.   Subjective: 8/24 A/O 4 patient states lesions began on her lower left leg and spread from there all over body. Had a physician check lesions out early on 1 time but never returned for care. States has not been following up on her diabetes, hypertension. States began have pustular rash under breasts and new excoriated rash across the top of breasts prior to admission. All lesions itch/painful     Assessment & Plan:   Principal Problem:   Severe anemia Active Problems:   Hypertension   Diabetes mellitus ([place])   Acute renal failure (HCC)   Thrombocytopenia (HCC)   Pressure ulcer   UTI (lower urinary tract infection)   Anemia   Acute on chronic renal failure (HCC)   Hydronephrosis determined by ultrasound   Papular rash   Uncontrolled type 2 diabetes mellitus with complication (HCC)    Paresthesia   Anemia   -Most likely multifactorial to include CKD, autoimmune (MCTD), malignancy? -8/19 transfused 2 units PRBC -8/20 transfused 2 units PRBC  Recent Labs Lab 08/25/15 0424 08/26/15 1132 08/27/15 0534 08/28/15 0255 08/29/15 0423  HGB 7.5* 7.4* 6.1* 7.2* 6.2*   Thrombocytopenia -8/20 transfused 2 units platelets -Hematology/Dr. [name] following -Awaiting Bone Marrow Biopsy results -Awaiting serum autoimmune labs -Transfuse for Hgb <7 and platelets less than 10K or active bleeding. -8/24 transfuse 2 units platelets Will need to transfuse platelets prior to skin biopsy  Acute on CKD -Last Cr= 1.04 in 2013 patient has not seen a physician other than 1 time for the lesions which started a year ago. Most likely creatinine worsening over time -Renal ultrasound bilateral Hydronephrosis results below.  - -Continue significant Hematuria - Post foley placement. -Strict in and out since admission - 6.2 L Lab Results  Component Value Date   CREATININE 3.42 (H) 08/29/2015   CREATININE 4.31 (H) 08/28/2015   CREATININE 5.35 (H) 08/27/2015  -Nephrology consulted  Complicated UTI -Continue Levofloxacin 500 mg  q 48 hr for 7-14 days  Papular Rash/Dark plaque lesions covering entire body -Diabetic lesion vs autoimmune lesion vs malignancy vs calciphylaxis with fungal infection -Empiric Diflucan -Empiric Diflucan 50 mg daily -On 8/24 contacted surgery for biopsy of lesion  Cardiomegaly  - Seen on CXR. -8/21 echocardiogram normal see results below  HTN -Controlled  Diabetes type 2 controlled with complications -S/P right BKA -Last hemoglobin A1c 11/2010= 12.4 -8/23 Hemoglobin  A1c = 5.3  -Lipid panel; within ADA guideline -Resistant SSI  Weakness/Numbness/Tingling - No focal deficit.  -Most likely secondary to chronically severely uncontrolled diabetes -After patient more stable may require Neurology consult for EMG  Rectovaginal fistula -Patient  appears to have stool exiting through vagina concern for rectovaginal fistula CT abdomen with oral contrast  Insomnia - Mild. -Trazodone qhs prn    DVT prophylaxis: SCD Code Status: Full Family Communication: None Disposition Plan: ??   Consultants:  Dr. Ladell Pier oncology [facility]   CCS consult pending   Procedures/Significant Events:  CXR PA/LAT 8/19: Questionable small left pleural effusion. No focal consolidation appreciated. Silhouetting of left hemidiaphragm. Cardiomegaly. Renal U/S 8/20: Diffusely distended bladder & moderate bilateral hydronephrosis. Normal renal size and echogenicity. LLE Duplex 8/20:  No DVT 8/19 transfused 2 units PRBC 8/20 transfused 2 units PRBC 8/20 transfused 2 units platelets 8/21 Echocardiogram: -- Left ventricle: mild LVH. -LVEF = 60% to 65%. 8/24 transfuse 2 units platelets   LABS: CRP:  5.1 ESR:  108 C3 >> C4 >> IgG >>  SSA >>  SSB >>  A-Smith Ab >>  A-Jo Ab >>  ANA >>  ENA >>  Aldolase >>  CK:  269 SPEP >>  TSH:  5.461 Free T4:  0.79    Cultures 8/19 blood right hand/wrist NGTD  8/19 urine positive Multiple species 8/20 negative MRSA by PCR  8/21 Bone Marrow Bx pending  Antimicrobials: Ceftriaxone x1 08/23/15 Levaquin 8/19 >> Fluconazole 8/19 >>   Devices    LINES / TUBES:  Foley 8/20 >> PIV x2    Continuous Infusions:     Objective: Vitals:   08/29/15 0145 08/29/15 0220 08/29/15 0245 08/29/15 0721  BP: 133/85 131/76 138/80 122/67  Pulse: 87 92 92   Resp: _0 Temp: 98.5 F (36.9 C) 98.4 F (36.9 C) 98.6 F (37 C) 98.9 F (37.2 C)  TempSrc: Oral Oral Oral Oral  SpO2: 100% 96% 98% 99%  Weight:      Height:        Intake/Output Summary (Last 24 hours) at 08/29/15 0931 Last data filed at 08/29/15 0730  Gross per 24 hour  Intake             3878 ml  Output             5225 ml  Net            -1347 ml   Filed Weights   08/26/15 1500 08/27/15 0500 08/28/15  0500  Weight: 106.8 kg (235 lb 8 oz) 106.1 kg (233 lb 14.5 oz) 107.6 kg (237 lb 3.4 oz)    Examination:  General: A/O 4, distress secondary to ongoing bleeding/skin lesions, No acute respiratory distress Eyes: negative scleral hemorrhage, negative anisocoria, negative icterus ENT: Negative Runny nose, negative gingival bleeding, Neck:  Negative scars, masses, torticollis, lymphadenopathy, JVD Lungs: Clear to auscultation bilaterally without wheezes or crackles Cardiovascular: Regular rate and rhythm without murmur gallop or rub normal S1 and S2 Abdomen: Morbidly obese, negative abdominal pain, nondistended, positive soft, bowel sounds, no rebound, no ascites, no appreciable mass Extremities/Skin: right BKA, left lower extremity edema 1-2+. Patient with multiple black plaques all over extremities. Pruritic/painful. These lesions or all over torso face upper extremities. Underneath left breast area where skin/lesion has become ulcerous. All across breasts rough slightly raised rash across her chest. Psychiatric:  Negative depression, negative anxiety, negative fatigue, negative mania  Central nervous system:  Cranial nerves II through XII intact, tongue/uvula midline, all extremities muscle strength 5/5, sensation intact throughout, negative dysarthria, negative expressive aphasia, negative receptive aphasia.  .     Data Reviewed: Care during the described time interval was provided by me .  I have reviewed this patient's available data, including medical history, events of note, physical examination, and all test results as part of my evaluation. I have personally reviewed and interpreted all radiology studies.  CBC:  Recent Labs Lab 08/25/15 0424 08/26/15 1132 08/26/15 1436 08/27/15 0534 08/28/15 0255 08/29/15 0423  WBC 9.4 8.4  --  8.2 9.8 9.5  NEUTROABS 7.1 6.5  --  6.3 6.8 7.1  HGB 7.5* 7.4*  --  6.1* 7.2* 6.2*  HCT 22.7* 22.6* 20.8* 19.0* 22.6* 19.8*  MCV 84.4 85.0  --  85.6  88.6 88.8  PLT 29* 32*  --  51* 55* 90*   Basic Metabolic Panel:  Recent Labs Lab 08/24/15 1015 08/25/15 0424 08/26/15 0434 08/27/15 0534 08/28/15 0255 08/29/15 0423  NA 138 141 142 143 142 140  K 4.2 3.9 3.6 3.3* 4.0 3.8  CL 116* 115* 110 104 103 102  CO2 9* 13* _0 GLUCOSE 110* 79 80 130* 107* 81  BUN 90* 80* 73* 60* 50* 40*  CREATININE 8.24* 7.03* 6.37* 5.35* 4.31* 3.42*  CALCIUM 6.9* 6.5* 6.1* 6.0* 6.2* 6.3*  MG 2.0  --  1.6* 1.4* 1.7 1.4*  PHOS 7.6*  --  5.4*  --   --  3.6   GFR: Estimated Creatinine Clearance: 25.4 mL/min (by C-G formula based on SCr of 3.42 mg/dL). Liver Function Tests:  Recent Labs Lab 08/23/15 1407 08/24/15 1015 08/26/15 0434 08/27/15 0534 08/28/15 0255 08/29/15 0423  AST 12* 13*  --  13* 14*  --   ALT 10* 11*  --  9* 9*  --   ALKPHOS 74 70  --  63 59  --   BILITOT 1.0 0.8  --  0.6 0.6  --   PROT 7.0 6.6  --  5.4* 5.8*  --   ALBUMIN 2.9* 2.5* 2.2* 2.2* 2.4* 2.3*   No results for input(s): LIPASE, AMYLASE in the last 168 hours. No results for input(s): AMMONIA in the last 168 hours. Coagulation Profile:  Recent Labs Lab 08/23/15 1446 08/24/15 1015 08/25/15 0424 08/26/15 1132 08/27/15 0534  INR 1.49 1.56 1.41 1.40 1.44   Cardiac Enzymes:  Recent Labs Lab 08/24/15 0119  CKTOTAL 269*   BNP (last 3 results) No results for input(s): PROBNP in the last 8760 hours. HbA1C:  Recent Labs  08/27/15 0534  HGBA1C 5.3   CBG:  Recent Labs Lab 08/28/15 0849 08/28/15 1303 08/28/15 1602 08/28/15 2101 08/29/15 0756  GLUCAP 123* 97 128* 121* 84   Lipid Profile:  Recent Labs  08/26/15 1132  CHOL 80  HDL 26*  LDLCALC 34  TRIG 98  CHOLHDL 3.1   Thyroid Function Tests: No results for input(s): TSH, T4TOTAL, FREET4, T3FREE, THYROIDAB in the last 72 hours. Anemia Panel: No results for input(s): VITAMINB12, FOLATE, FERRITIN, TIBC, IRON, RETICCTPCT in the last 72 hours. Urine analysis:    Component Value  Date/Time   COLORURINE YELLOW 08/23/2015 1630   APPEARANCEUR TURBID (A) 08/23/2015 1630   LABSPEC 1.014 08/23/2015 1630   PHURINE 6.0 08/23/2015 1630   GLUCOSEU NEGATIVE 08/23/2015 1630   HGBUR MODERATE (A) 08/23/2015 1630   BILIRUBINUR NEGATIVE 08/23/2015 1630   KETONESUR NEGATIVE 08/23/2015 1630   PROTEINUR 100 (  A) 08/23/2015 1630   UROBILINOGEN 2.0 (H) 07/08/2011 0051   NITRITE NEGATIVE 08/23/2015 1630   LEUKOCYTESUR LARGE (A) 08/23/2015 1630   Sepsis Labs: _0 (procalcitonin:4,lacticidven:4)  ) Recent Results (from the past 240 hour(s))  Urine culture     Status: Abnormal   Collection Time: 08/23/15  4:30 PM  Result Value Ref Range Status   Specimen Description URINE, RANDOM  Final   Special Requests NONE  Final   Culture MULTIPLE SPECIES PRESENT, SUGGEST RECOLLECTION (A)  Final   Report Status 08/24/2015 FINAL  Final  Culture, blood (routine x 2)     Status: None   Collection Time: 08/23/15  8:58 PM  Result Value Ref Range Status   Specimen Description BLOOD RIGHT HAND  Final   Special Requests BOTTLES DRAWN AEROBIC ONLY 5CC  Final   Culture NO GROWTH 5 DAYS  Final   Report Status 08/28/2015 FINAL  Final  Culture, blood (routine x 2)     Status: None   Collection Time: 08/23/15  9:04 PM  Result Value Ref Range Status   Specimen Description BLOOD RIGHT WRIST  Final   Special Requests IN PEDIATRIC BOTTLE 3CC  Final   Culture NO GROWTH 5 DAYS  Final   Report Status 08/28/2015 FINAL  Final  MRSA PCR Screening     Status: None   Collection Time: 08/24/15 12:58 AM  Result Value Ref Range Status   MRSA by PCR NEGATIVE NEGATIVE Final    Comment:        The GeneXpert MRSA Assay (FDA approved for NASAL specimens only), is one component of a comprehensive MRSA colonization surveillance program. It is not intended to diagnose MRSA infection nor to guide or monitor treatment for MRSA infections.          Radiology Studies: Ct Abdomen Pelvis Wo  Contrast  Result Date: 08/29/2015 CLINICAL DATA:  Stool from vagina. EXAM: CT ABDOMEN AND PELVIS WITHOUT CONTRAST TECHNIQUE: Multidetector CT imaging of the abdomen and pelvis was performed following the standard protocol without IV contrast. COMPARISON:  Renal ultrasound 08/23/2015 FINDINGS: Lower chest and abdominal wall:  Body wall edema Trace layering pleural effusions. Interstitial opacities in the basilar lungs. Cardiomegaly and trace pericardial fluid Hepatobiliary: No focal liver abnormality.No evidence of biliary obstruction or stone. Pancreas: Unremarkable. Spleen: Upper limits of normal size. Adrenals/Urinary Tract: Negative adrenals. Bilateral hydroureteronephrosis to the level of the bladder, also seen on comparison sonogram. Interval decompression of the bladder with Foley catheter. Bladder wall is thickened and indistinct, as seen with cystitis. No visible pelvic mass or other certain explanation for ureteral obstruction. Stomach/Bowel: Oral contrast was provided and is seen to the level of the distal transverse colon. No contrast at the level of the rectum which is in close proximity to the vagina. Rectovaginal fistula cannot be excluded on this exam. No bowel obstruction. No appendicitis. Reproductive:Cannot exclude rectovaginal fistula as above. Nonspecific stranding about the bladder and uterus. Vascular/Lymphatic: No acute vascular abnormality. Mild reactive appearing enlargement of bilateral inguinal lymph nodes. Other: No ascites or pneumoperitoneum Musculoskeletal: No acute abnormalities. IMPRESSION: 1. Oral contrast did not reach the rectum and a clear rectovaginal fistula is not demonstrated. Preferably would repeat pelvis CT with contrast per rectum. 2. Persisting bilateral hydroureteronephrosis despite Foley catheter decompression of the inflamed bladder. There is inflammatory changes in the pelvis but no discrete obstructing process seen. 3. Cardiomegaly with trace effusions and basilar  atelectasis or edema. Electronically Signed   By: Monte Fantasia M.D.   On:  08/29/2015 00:39        Scheduled Meds: . sodium chloride   Intravenous Once  . antiseptic oral rinse  7 mL Mouth Rinse BID  . calcium carbonate  2 tablet Oral TID  . fluconazole (DIFLUCAN) IV  50 mg Intravenous Q24H  . insulin aspart  0-20 Units Subcutaneous TID WC  . insulin aspart  0-5 Units Subcutaneous QHS  . levofloxacin (LEVAQUIN) IV  500 mg Intravenous Q48H   Continuous Infusions:     LOS: 6 days    Time spent: 40 minutes    WOODS, Geraldo Docker, MD [facility] Pager [phone]   If 7PM-7AM, please contact night-coverage [email or web address] Password [facility] 08/29/2015, 9:31 AM

## 2015-08-30 ENCOUNTER — Inpatient Hospital Stay (HOSPITAL_COMMUNITY): Payer: Medicaid Other

## 2015-08-30 LAB — PREPARE PLATELET PHERESIS
Unit division: 0
Unit division: 0

## 2015-08-30 LAB — APTT: aPTT: 38 seconds — ABNORMAL HIGH (ref 24–36)

## 2015-08-30 LAB — PROTIME-INR
INR: 1.39
Prothrombin Time: 17.2 seconds — ABNORMAL HIGH (ref 11.4–15.2)

## 2015-08-30 LAB — RENAL FUNCTION PANEL
Albumin: 2.4 g/dL — ABNORMAL LOW (ref 3.5–5.0)
Anion gap: 8 (ref 5–15)
BUN: 37 mg/dL — ABNORMAL HIGH (ref 6–20)
CO2: 28 mmol/L (ref 22–32)
Calcium: 6.6 mg/dL — ABNORMAL LOW (ref 8.9–10.3)
Chloride: 104 mmol/L (ref 101–111)
Creatinine, Ser: 3.28 mg/dL — ABNORMAL HIGH (ref 0.44–1.00)
GFR calc Af Amer: 18 mL/min — ABNORMAL LOW (ref >60)
GFR calc non Af Amer: 16 mL/min — ABNORMAL LOW (ref >60)
Glucose, Bld: 142 mg/dL — ABNORMAL HIGH (ref 65–99)
Phosphorus: 3.4 mg/dL (ref 2.5–4.6)
Potassium: 4 mmol/L (ref 3.5–5.1)
Sodium: 140 mmol/L (ref 135–145)

## 2015-08-30 LAB — GLUCOSE, CAPILLARY
Glucose-Capillary: 104 mg/dL — ABNORMAL HIGH (ref 65–99)
Glucose-Capillary: 104 mg/dL — ABNORMAL HIGH (ref 65–99)
Glucose-Capillary: 121 mg/dL — ABNORMAL HIGH (ref 65–99)
Glucose-Capillary: 230 mg/dL — ABNORMAL HIGH (ref 65–99)
Glucose-Capillary: 120 mg/dL — ABNORMAL HIGH (ref 65–99)

## 2015-08-30 LAB — CBC
HCT: 21.9 % — ABNORMAL LOW (ref 36.0–46.0)
Hemoglobin: 6.8 g/dL — CL (ref 12.0–15.0)
MCH: 28 pg (ref 26.0–34.0)
MCHC: 31.1 g/dL (ref 30.0–36.0)
MCV: 90.1 fL (ref 78.0–100.0)
Platelets: 107 10*3/uL — ABNORMAL LOW (ref 150–400)
RBC: 2.43 MIL/uL — ABNORMAL LOW (ref 3.87–5.11)
RDW: 19.2 % — ABNORMAL HIGH (ref 11.5–15.5)
WBC: 6.7 10*3/uL (ref 4.0–10.5)

## 2015-08-30 LAB — MAGNESIUM: Magnesium: 1.5 mg/dL — ABNORMAL LOW (ref 1.7–2.4)

## 2015-08-30 MED ORDER — DIATRIZOATE MEGLUMINE & SODIUM 66-10 % PO SOLN
ORAL | Status: AC
  Filled 2015-08-30: qty 60

## 2015-08-30 MED ORDER — MAGNESIUM OXIDE 400 (241.3 MG) MG PO TABS
400.0000 mg | ORAL_TABLET | Freq: Three times a day (TID) | ORAL | Status: DC
  Administered 2015-08-30 – 2015-09-01 (×8): 400 mg via ORAL
  Filled 2015-08-30 (×8): qty 1

## 2015-08-30 MED ORDER — SODIUM CHLORIDE 0.45 % IV SOLN
INTRAVENOUS | Status: DC
  Administered 2015-08-30 – 2015-09-03 (×5): via INTRAVENOUS

## 2015-08-30 NOTE — Evaluation (Signed)
Physical Therapy Evaluation Patient Details Name: [name] MRN: [id number] DOB: [birth year] Today's Date: 08/30/2015   History of Present Illness  Patient is a 50 yo female admitted 08/23/15 with general weakness, severe anemia (Hgb 4.2), UTI, AKI, skin lesions.    PMH:  DM, neuropathy, h/o Rt BKA, HTN, ARF, anxiety, ICH, CVA    Clinical Impression  Patient presents with problems listed below.  Will benefit from acute PT to maximize functional mobility prior to discharge home with family.  Patient with general weakness impacting functional mobility.  Recommend Inpatient Rehab consult to return patient to optimal functional level to ensure safe d/c home with family.    Follow Up Recommendations CIR;Supervision for mobility/OOB    Equipment Recommendations  3in1 (PT)    Recommendations for Other Services Rehab consult     Precautions / Restrictions Precautions Precautions: Fall Restrictions Weight Bearing Restrictions: No      Mobility  Bed Mobility Overal bed mobility: Needs Assistance Bed Mobility: Supine to Sit;Sit to Supine     Supine to sit: Mod assist;HOB elevated Sit to supine: Min assist;HOB elevated   General bed mobility comments: Verbal cues for technique.  Assist to raise trunk to sitting position, and to bring hips forward to EOB.  Good sitting balance once upright.  Patient able to sit EOB 6 minutes - fatigue and lightheaded.  Returned to supine with min assist.  Transfers                 General transfer comment: NT  Ambulation/Gait                Stairs            Wheelchair Mobility    Modified Rankin (Stroke Patients Only)       Balance Overall balance assessment: Needs assistance Sitting-balance support: No upper extremity supported;Feet supported Sitting balance-Leahy Scale: Good                                       Pertinent Vitals/Pain Pain Assessment: No/denies pain    Home Living  Family/patient expects to be discharged to:: Private residence Living Arrangements: Children (2 yo and 89 yo) Available Help at Discharge: Family (Mom is in town from [place] to help.) Type of Home: House Home Access: Stairs to enter Entrance Stairs-Rails: None Secretary/administrator of Steps: 1 Home Layout: Two level;Able to live on main level with bedroom/bathroom Home Equipment: Dan Humphreys - 2 wheels;Wheelchair - manual      Prior Function Level of Independence: Independent with assistive device(s)         Comments: Patient does not use her prosthesis.  Uses w/c for mobility, and walker for bathroom.  Uses cabs for transportation, but having trouble getting into/out of cab with weakness.     Hand Dominance        Extremity/Trunk Assessment   Upper Extremity Assessment: Generalized weakness (Reports new numbness in both hands)           Lower Extremity Assessment: RLE deficits/detail;LLE deficits/detail RLE Deficits / Details: BKA.  Able to move against gravity LLE Deficits / Details: Strength grossly 3-/5;  Reports new numbness in foot.     Communication   Communication: No difficulties  Cognition Arousal/Alertness: Awake/alert Behavior During Therapy: Flat affect (Tearful talking about children having to help her.) Overall Cognitive Status: Within Functional Limits for tasks assessed  General Comments General comments (skin integrity, edema, etc.): Skin lesions throughout    Exercises        Assessment/Plan    PT Assessment Patient needs continued PT services  PT Diagnosis Difficulty walking;Generalized weakness   PT Problem List Decreased strength;Decreased activity tolerance;Decreased balance;Decreased mobility;Cardiopulmonary status limiting activity;Impaired sensation;Obesity;Decreased skin integrity  PT Treatment Interventions DME instruction;Gait training;Functional mobility training;Therapeutic activities;Therapeutic  exercise;Balance training;Patient/family education;Wheelchair mobility training   PT Goals (Current goals can be found in the Care Plan section) Acute Rehab PT Goals Patient Stated Goal: To get my strength back PT Goal Formulation: With patient Time For Goal Achievement: 09/13/15 Potential to Achieve Goals: Good    Frequency Min 3X/week   Barriers to discharge Decreased caregiver support Patient lives with her young children.  Mother from [place] currently here to assist with children.    Co-evaluation               End of Session   Activity Tolerance: Patient limited by fatigue Patient left: in bed;with call bell/phone within reach;with SCD's reapplied Nurse Communication: Mobility status         Time: [id number] PT Time Calculation (min) (ACUTE ONLY): 15 min   Charges:   PT Evaluation $PT Eval High Complexity: 1 Procedure     PT G CodesVena Austria:        Wandalee Klang H 08/30/2015, 6:24 PM [name] HurtSusan H. Renaldo Fiddleravis, PT, [facility] Acute Rehab Services Pager [phone]

## 2015-08-30 NOTE — Progress Notes (Signed)
S:  O:BP (!) 114/96 (BP Location: Right Arm)   Pulse 86   Temp 98.7 F (37.1 C) (Oral)   Resp (!) 28   Ht _0  (1.702 m)   Wt 107.6 kg (237 lb 3.4 oz)   LMP  (LMP Unknown)   SpO2 100%   BMI 37.15 kg/m   Intake/Output Summary (Last 24 hours) at 08/30/15 0817 Last data filed at 08/30/15 8119  Gross per 24 hour  Intake             3165 ml  Output             4050 ml  Net             -885 ml   Weight change:  JYN:WGNFA and alert CVS:RRR Resp:Clear Abd:+ BS NTND Ext:Rt BKA.  No edema.  Mult chronic pustular lesions over arms/legs/torso NEURO: Awake and alert, Ox3  No asterixis GU urine in foley yellow   . sodium chloride   Intravenous Once  . calcium carbonate  2 tablet Oral TID  . fluconazole (DIFLUCAN) IV  50 mg Intravenous Q24H  . insulin aspart  0-20 Units Subcutaneous TID WC  . insulin aspart  0-5 Units Subcutaneous QHS  . levofloxacin (LEVAQUIN) IV  500 mg Intravenous Q48H  . mouth rinse  15 mL Mouth Rinse BID  . phytonadione  10 mg Subcutaneous Q12H   Ct Abdomen Pelvis Wo Contrast  Result Date: 08/29/2015 CLINICAL DATA:  Stool from vagina. EXAM: CT ABDOMEN AND PELVIS WITHOUT CONTRAST TECHNIQUE: Multidetector CT imaging of the abdomen and pelvis was performed following the standard protocol without IV contrast. COMPARISON:  Renal ultrasound 08/23/2015 FINDINGS: Lower chest and abdominal wall:  Body wall edema Trace layering pleural effusions. Interstitial opacities in the basilar lungs. Cardiomegaly and trace pericardial fluid Hepatobiliary: No focal liver abnormality.No evidence of biliary obstruction or stone. Pancreas: Unremarkable. Spleen: Upper limits of normal size. Adrenals/Urinary Tract: Negative adrenals. Bilateral hydroureteronephrosis to the level of the bladder, also seen on comparison sonogram. Interval decompression of the bladder with Foley catheter. Bladder wall is thickened and indistinct, as seen with cystitis. No visible pelvic mass or other certain  explanation for ureteral obstruction. Stomach/Bowel: Oral contrast was provided and is seen to the level of the distal transverse colon. No contrast at the level of the rectum which is in close proximity to the vagina. Rectovaginal fistula cannot be excluded on this exam. No bowel obstruction. No appendicitis. Reproductive:Cannot exclude rectovaginal fistula as above. Nonspecific stranding about the bladder and uterus. Vascular/Lymphatic: No acute vascular abnormality. Mild reactive appearing enlargement of bilateral inguinal lymph nodes. Other: No ascites or pneumoperitoneum Musculoskeletal: No acute abnormalities. IMPRESSION: 1. Oral contrast did not reach the rectum and a clear rectovaginal fistula is not demonstrated. Preferably would repeat pelvis CT with contrast per rectum. 2. Persisting bilateral hydroureteronephrosis despite Foley catheter decompression of the inflamed bladder. There is inflammatory changes in the pelvis but no discrete obstructing process seen. 3. Cardiomegaly with trace effusions and basilar atelectasis or edema. Electronically Signed   By: Monte Fantasia M.D.   On: 08/29/2015 00:39   BMET    Component Value Date/Time   NA 140 08/30/2015 0416   K 4.0 08/30/2015 0416   CL 104 08/30/2015 0416   CO2 28 08/30/2015 0416   GLUCOSE 142 (H) 08/30/2015 0416   BUN 37 (H) 08/30/2015 0416   CREATININE 3.28 (H) 08/30/2015 0416   CALCIUM 6.6 (L) 08/30/2015 0416   GFRNONAA 16 (L) 08/30/2015  0416   GFRAA 18 (L) 08/30/2015 0416   CBC    Component Value Date/Time   WBC 9.5 08/29/2015 0423   RBC 2.23 (L) 08/29/2015 0423   HGB 6.2 (LL) 08/29/2015 0423   HCT 19.8 (L) 08/29/2015 0423   HCT 20.8 (L) 08/26/2015 1436   PLT 90 (L) 08/29/2015 0423   MCV 88.8 08/29/2015 0423   MCH 27.8 08/29/2015 0423   MCHC 31.3 08/29/2015 0423   RDW 19.7 (H) 08/29/2015 0423   LYMPHSABS 1.6 08/29/2015 0423   MONOABS 0.7 08/29/2015 0423   EOSABS 0.1 08/29/2015 0423   BASOSABS 0.0 08/29/2015 0423      Assessment:  1. Presumably ARF in setting of Bil hydronephrosis,  probable BOO as UO excellent after foley placement and Scr cont to decrease 2. Anemia/thrombocytopenia  ? Etiology   BM unrevealing  3. DM 4.  Chronic diabetic skin lesions, nurse says she is scheduled for Bx 5. Met acidosis, improved 6. Hypocalcemia on oral replacement 7. Hypomag 8. Hypokalemia, improved Plan: 1.  Resume IV fluids since O>I 2. Oral mag oxide 3. Needs CBC/Plt, will order 4. Daily Scr Mira Balon T

## 2015-08-30 NOTE — Progress Notes (Signed)
PROGRESS NOTE    [name]  LOV:[id number] DOB: 1965/06/17 DOA: 08/23/2015 PCP: [name], MD   Brief Narrative:   [name] is a 50 y.o. BF PMHx Intracerebral hemorrhage, Stroke DM type II uncontrolled with complications, S/P right BKA, HTN    Present emergency room chief complaint weakness. Patient states that over the last 4 week she's been becoming increasingly weak. She states that gradually she's also developed a feeling of lack of sensation in her extremities. This is specially gotten worse over the last week. Patient states that prior to this she didn't have any odd sensations of weakness or numbness. Patient states that things came to head when she got so weak the day of admission she could not get out of bed. For the last several days patient's children have helped her get around the house.  Patient complains of increasing anxiety over these issue. Positive for orthopnea. Denies sick contacts. Denies fever.  Patient does have a history of neuropathy. Due to cost she has not seen her physician in a year. She has also been off her medications for 1 year.   Subjective: 8/26 A/O 4 still awaiting surgery to perform biopsy of skin lesions     Assessment & Plan:   Principal Problem:   Severe anemia Active Problems:   Hypertension   Diabetes mellitus ([place])   Acute renal failure (HCC)   Thrombocytopenia (HCC)   Pressure ulcer   UTI (lower urinary tract infection)   Absolute anemia   Acute on chronic renal failure (HCC)   Hydronephrosis determined by ultrasound   Papular rash   Uncontrolled type 2 diabetes mellitus with complication ([place])   Paresthesia   Rectovaginal fistula   Controlled diabetes mellitus type 2 with complications ([place])   Anemia   -Most likely multifactorial to include CKD, autoimmune (MCTD), malignancy? -8/19 transfused 2 units PRBC -8/20 transfused 2 units PRBC  Recent Labs Lab 08/26/15 1132 08/27/15 0534 08/28/15 0255  08/29/15 0423 08/30/15 0845  HGB 7.4* 6.1* 7.2* 6.2* 6.8*   Thrombocytopenia -8/20 transfused 2 units platelets -Hematology/Dr. [name] following -Awaiting Bone Marrow Biopsy results -Awaiting serum autoimmune labs -Transfuse for Hgb <7 and platelets less than 10K or active bleeding. -8/24 transfuse 2 units platelets Will need to transfuse platelets prior to skin biopsy  Acute on CKD -Last Cr= 1.04 in 2013 patient has not seen a physician other than 1 time for the lesions which started a year ago. Most likely creatinine worsening over time -Renal ultrasound bilateral Hydronephrosis results below.  - -Continue significant Hematuria - Post foley placement. -Strict in and out since admission - 5.0 L Lab Results  Component Value Date   CREATININE 3.28 (H) 08/30/2015   CREATININE 3.42 (H) 08/29/2015   CREATININE 4.31 (H) 08/28/2015  -Nephrology consulted  Complicated UTI -Continue Levofloxacin 500 mg  q 48 hr for 7-14 days  Papular Rash/Dark plaque lesions covering entire body -Diabetic lesion vs autoimmune lesion vs malignancy vs calciphylaxis with fungal infection -Empiric Diflucan -Empiric Diflucan 50 mg daily -On 8/24 contacted surgery for biopsy of lesion: We'll perform biopsy on 8/26  Cardiomegaly  - Seen on CXR. -8/21 echocardiogram normal see results below  HTN -Controlled  Hypomagnesemia -Magnesium IV 2 gm  Diabetes type 2 controlled with complications -S/P right BKA -Last hemoglobin A1c 11/2010= 12.4 -8/23 Hemoglobin A1c = 5.3  -Lipid panel; within ADA guideline -Resistant SSI  Weakness/Numbness/Tingling - No focal deficit.  -Most likely secondary to chronically severely uncontrolled diabetes -After patient  more stable may require Neurology consult for EMG  Rectovaginal fistula -CT abdomen pelvis with PO contrast: Shows possible rectovaginal fistula. -Per radiology Repeat CT pelvis with contrast per rectum pending.  Insomnia - Mild. -Trazodone  qhs prn    DVT prophylaxis: SCD Code Status: Full Family Communication: None Disposition Plan: ??   Consultants:  Dr. Ladell Pier oncology Dr.Haugan [facility] Lisette Abu CCS consult    Procedures/Significant Events:  CXR PA/LAT 8/19: Questionable small left pleural effusion. No focal consolidation appreciated. Silhouetting of left hemidiaphragm. Cardiomegaly. Renal U/S 8/20: Diffusely distended bladder & moderate bilateral hydronephrosis. Normal renal size and echogenicity. LLE Duplex 8/20:  No DVT 8/19 transfused 2 units PRBC 8/20 transfused 2 units PRBC 8/20 transfused 2 units platelets 8/21 Echocardiogram: -- Left ventricle: mild LVH. -LVEF = 60% to 65%. 8/24 transfuse 2 units platelets  8/24 CT Abdomen Pelvis W/ PO Contrast; -Oral contrast did not reach the rectum and a clear rectovaginal fistula is not demonstrated. Preferably would repeat pelvis CT with contrast per rectum. -Persisting bilateral hydroureteronephrosis despite Foley catheter decompression of the inflamed bladder.  - Inflammatory changes in the pelvis but no discrete obstructing process seen. -Cardiomegaly with trace effusions and basilar atelectasis or edema.  LABS: CRP:  5.1 ESR:  108 C3 >> C4 >> IgG >>  SSA >>  SSB >>  A-Smith Ab >>  A-Jo Ab >>  ANA >>  ENA >>  Aldolase >>  CK:  269 SPEP >>  TSH:  5.461 Free T4:  0.79    Cultures 8/19 blood right hand/wrist NGTD  8/19 urine positive Multiple species 8/20 negative MRSA by PCR  8/21 Bone Marrow Bx pending  Antimicrobials: Ceftriaxone x1 08/23/15 Levaquin 8/19 >> Fluconazole 8/19 >>   Devices    LINES / TUBES:  Foley 8/20 >> PIV x2    Continuous Infusions: . sodium chloride 50 mL/hr at 08/30/15 0827     Objective: Vitals:   08/30/15 0759 08/30/15 1023 08/30/15 1444 08/30/15 1500  BP: (!) 114/96 109/72  120/89  Pulse: 86 90    Resp: (!) 28 (!) 25  (!) 92  Temp: 98.7 F (37.1 C) 98.2 F (36.8  C) 97.5 F (36.4 C)   TempSrc: Oral Oral Oral   SpO2: 100% 99%    Weight:      Height:        Intake/Output Summary (Last 24 hours) at 08/30/15 1658 Last data filed at 08/30/15 1600  Gross per 24 hour  Intake           1732.5 ml  Output             5800 ml  Net          -4067.5 ml   Filed Weights   08/26/15 1500 08/27/15 0500 08/28/15 0500  Weight: 106.8 kg (235 lb 8 oz) 106.1 kg (233 lb 14.5 oz) 107.6 kg (237 lb 3.4 oz)    Examination:  General: A/O 4, NAD, No acute respiratory distress Eyes: negative scleral hemorrhage, negative anisocoria, negative icterus ENT: Negative Runny nose, negative gingival bleeding, Neck:  Negative scars, masses, torticollis, lymphadenopathy, JVD Lungs: Clear to auscultation bilaterally without wheezes or crackles Cardiovascular: Regular rate and rhythm without murmur gallop or rub normal S1 and S2 Abdomen: Morbidly obese, negative abdominal pain, nondistended, positive soft, bowel sounds, no rebound, no ascites, no appreciable mass Extremities/Skin: right BKA, left lower extremity edema 1-2+. Patient with multiple black plaques all over extremities. Pruritic/painful. These lesions  or all over torso face upper extremities. Underneath left breast area where skin/lesion has become ulcerous. All across breasts rough slightly raised rash across her chest. Psychiatric:  Negative depression, negative anxiety, negative fatigue, negative mania  Central nervous system:  Cranial nerves II through XII intact, tongue/uvula midline, all extremities muscle strength 5/5, sensation intact throughout, negative dysarthria, negative expressive aphasia, negative receptive aphasia.  .     Data Reviewed: Care during the described time interval was provided by me .  I have reviewed this patient's available data, including medical history, events of note, physical examination, and all test results as part of my evaluation. I have personally reviewed and interpreted all  radiology studies.  CBC:  Recent Labs Lab 08/25/15 0424 08/26/15 1132 08/26/15 1436 08/27/15 0534 08/28/15 0255 08/29/15 0423 08/30/15 0845  WBC 9.4 8.4  --  8.2 9.8 9.5 6.7  NEUTROABS 7.1 6.5  --  6.3 6.8 7.1  --   HGB 7.5* 7.4*  --  6.1* 7.2* 6.2* 6.8*  HCT 22.7* 22.6* 20.8* 19.0* 22.6* 19.8* 21.9*  MCV 84.4 85.0  --  85.6 88.6 88.8 90.1  PLT 29* 32*  --  51* 55* 90* 578*   Basic Metabolic Panel:  Recent Labs Lab 08/24/15 1015  08/26/15 0434 08/27/15 0534 08/28/15 0255 08/29/15 0423 08/30/15 0416  NA 138  < > 142 143 142 140 140  K 4.2  < > 3.6 3.3* 4.0 3.8 4.0  CL 116*  < > 110 104 103 102 104  CO2 9*  < > '22 29 30 28 28  ' GLUCOSE 110*  < > 80 130* 107* 81 142*  BUN 90*  < > 73* 60* 50* 40* 37*  CREATININE 8.24*  < > 6.37* 5.35* 4.31* 3.42* 3.28*  CALCIUM 6.9*  < > 6.1* 6.0* 6.2* 6.3* 6.6*  MG 2.0  --  1.6* 1.4* 1.7 1.4* 1.5*  PHOS 7.6*  --  5.4*  --   --  3.6 3.4  < > = values in this interval not displayed. GFR: Estimated Creatinine Clearance: 26.5 mL/min (by C-G formula based on SCr of 3.28 mg/dL). Liver Function Tests:  Recent Labs Lab 08/24/15 1015 08/26/15 0434 08/27/15 0534 08/28/15 0255 08/29/15 0423 08/30/15 0416  AST 13*  --  13* 14*  --   --   ALT 11*  --  9* 9*  --   --   ALKPHOS 70  --  63 59  --   --   BILITOT 0.8  --  0.6 0.6  --   --   PROT 6.6  --  5.4* 5.8*  --   --   ALBUMIN 2.5* 2.2* 2.2* 2.4* 2.3* 2.4*   No results for input(s): LIPASE, AMYLASE in the last 168 hours. No results for input(s): AMMONIA in the last 168 hours. Coagulation Profile:  Recent Labs Lab 08/24/15 1015 08/25/15 0424 08/26/15 1132 08/27/15 0534 08/30/15 0416  INR 1.56 1.41 1.40 1.44 1.39   Cardiac Enzymes:  Recent Labs Lab 08/24/15 0119  CKTOTAL 269*   BNP (last 3 results) No results for input(s): PROBNP in the last 8760 hours. HbA1C: No results for input(s): HGBA1C in the last 72 hours. CBG:  Recent Labs Lab 08/29/15 1145 08/29/15 1725  08/29/15 2139 08/30/15 0759 08/30/15 1137  GLUCAP 129* 152* 118* 104* 104*   Lipid Profile: No results for input(s): CHOL, HDL, LDLCALC, TRIG, CHOLHDL, LDLDIRECT in the last 72 hours. Thyroid Function Tests: No results for input(s): TSH, T4TOTAL,  FREET4, T3FREE, THYROIDAB in the last 72 hours. Anemia Panel: No results for input(s): VITAMINB12, FOLATE, FERRITIN, TIBC, IRON, RETICCTPCT in the last 72 hours. Urine analysis:    Component Value Date/Time   COLORURINE YELLOW 08/23/2015 1630   APPEARANCEUR TURBID (A) 08/23/2015 1630   LABSPEC 1.014 08/23/2015 1630   PHURINE 6.0 08/23/2015 1630   GLUCOSEU NEGATIVE 08/23/2015 1630   HGBUR MODERATE (A) 08/23/2015 1630   BILIRUBINUR NEGATIVE 08/23/2015 1630   KETONESUR NEGATIVE 08/23/2015 1630   PROTEINUR 100 (A) 08/23/2015 1630   UROBILINOGEN 2.0 (H) 07/08/2011 0051   NITRITE NEGATIVE 08/23/2015 1630   LEUKOCYTESUR LARGE (A) 08/23/2015 1630   Sepsis Labs: '@LABRCNTIP' (procalcitonin:4,lacticidven:4)  ) Recent Results (from the past 240 hour(s))  Urine culture     Status: Abnormal   Collection Time: 08/23/15  4:30 PM  Result Value Ref Range Status   Specimen Description URINE, RANDOM  Final   Special Requests NONE  Final   Culture MULTIPLE SPECIES PRESENT, SUGGEST RECOLLECTION (A)  Final   Report Status 08/24/2015 FINAL  Final  Culture, blood (routine x 2)     Status: None   Collection Time: 08/23/15  8:58 PM  Result Value Ref Range Status   Specimen Description BLOOD RIGHT HAND  Final   Special Requests BOTTLES DRAWN AEROBIC ONLY 5CC  Final   Culture NO GROWTH 5 DAYS  Final   Report Status 08/28/2015 FINAL  Final  Culture, blood (routine x 2)     Status: None   Collection Time: 08/23/15  9:04 PM  Result Value Ref Range Status   Specimen Description BLOOD RIGHT WRIST  Final   Special Requests IN PEDIATRIC BOTTLE 3CC  Final   Culture NO GROWTH 5 DAYS  Final   Report Status 08/28/2015 FINAL  Final  MRSA PCR Screening     Status:  None   Collection Time: 08/24/15 12:58 AM  Result Value Ref Range Status   MRSA by PCR NEGATIVE NEGATIVE Final    Comment:        The GeneXpert MRSA Assay (FDA approved for NASAL specimens only), is one component of a comprehensive MRSA colonization surveillance program. It is not intended to diagnose MRSA infection nor to guide or monitor treatment for MRSA infections.          Radiology Studies: Ct Abdomen Pelvis Wo Contrast  Result Date: 08/29/2015 CLINICAL DATA:  Stool from vagina. EXAM: CT ABDOMEN AND PELVIS WITHOUT CONTRAST TECHNIQUE: Multidetector CT imaging of the abdomen and pelvis was performed following the standard protocol without IV contrast. COMPARISON:  Renal ultrasound 08/23/2015 FINDINGS: Lower chest and abdominal wall:  Body wall edema Trace layering pleural effusions. Interstitial opacities in the basilar lungs. Cardiomegaly and trace pericardial fluid Hepatobiliary: No focal liver abnormality.No evidence of biliary obstruction or stone. Pancreas: Unremarkable. Spleen: Upper limits of normal size. Adrenals/Urinary Tract: Negative adrenals. Bilateral hydroureteronephrosis to the level of the bladder, also seen on comparison sonogram. Interval decompression of the bladder with Foley catheter. Bladder wall is thickened and indistinct, as seen with cystitis. No visible pelvic mass or other certain explanation for ureteral obstruction. Stomach/Bowel: Oral contrast was provided and is seen to the level of the distal transverse colon. No contrast at the level of the rectum which is in close proximity to the vagina. Rectovaginal fistula cannot be excluded on this exam. No bowel obstruction. No appendicitis. Reproductive:Cannot exclude rectovaginal fistula as above. Nonspecific stranding about the bladder and uterus. Vascular/Lymphatic: No acute vascular abnormality. Mild reactive appearing enlargement of  bilateral inguinal lymph nodes. Other: No ascites or pneumoperitoneum  Musculoskeletal: No acute abnormalities. IMPRESSION: 1. Oral contrast did not reach the rectum and a clear rectovaginal fistula is not demonstrated. Preferably would repeat pelvis CT with contrast per rectum. 2. Persisting bilateral hydroureteronephrosis despite Foley catheter decompression of the inflamed bladder. There is inflammatory changes in the pelvis but no discrete obstructing process seen. 3. Cardiomegaly with trace effusions and basilar atelectasis or edema. Electronically Signed   By: Monte Fantasia M.D.   On: 08/29/2015 00:39        Scheduled Meds: . sodium chloride   Intravenous Once  . calcium carbonate  2 tablet Oral TID  . fluconazole (DIFLUCAN) IV  50 mg Intravenous Q24H  . insulin aspart  0-20 Units Subcutaneous TID WC  . insulin aspart  0-5 Units Subcutaneous QHS  . levofloxacin (LEVAQUIN) IV  500 mg Intravenous Q48H  . magnesium oxide  400 mg Oral TID  . mouth rinse  15 mL Mouth Rinse BID   Continuous Infusions: . sodium chloride 50 mL/hr at 08/30/15 0827     LOS: 7 days    Time spent: 40 minutes    Damare Serano, Geraldo Docker, MD [facility] Pager [phone]   If 7PM-7AM, please contact night-coverage [email or web address] Password [facility] 08/30/2015, 4:58 PM

## 2015-08-31 ENCOUNTER — Inpatient Hospital Stay (HOSPITAL_COMMUNITY): Payer: Medicaid Other

## 2015-08-31 DIAGNOSIS — F411 Generalized anxiety disorder: Secondary | ICD-10-CM

## 2015-08-31 DIAGNOSIS — R0601 Orthopnea: Secondary | ICD-10-CM

## 2015-08-31 DIAGNOSIS — R195 Other fecal abnormalities: Secondary | ICD-10-CM

## 2015-08-31 LAB — IRON AND TIBC
Iron: 41 ug/dL (ref 28–170)
Saturation Ratios: 15 % (ref 10.4–31.8)
TIBC: 265 ug/dL (ref 250–450)
UIBC: 224 ug/dL

## 2015-08-31 LAB — PHOSPHORUS: Phosphorus: 3.6 mg/dL (ref 2.5–4.6)

## 2015-08-31 LAB — CBC WITH DIFFERENTIAL/PLATELET
Basophils Absolute: 0 10*3/uL (ref 0.0–0.1)
Basophils Relative: 0 %
Eosinophils Absolute: 0.2 10*3/uL (ref 0.0–0.7)
Eosinophils Relative: 3 %
HCT: 20.7 % — ABNORMAL LOW (ref 36.0–46.0)
Hemoglobin: 6.4 g/dL — CL (ref 12.0–15.0)
Lymphocytes Relative: 20 %
Lymphs Abs: 1.2 10*3/uL (ref 0.7–4.0)
MCH: 27.9 pg (ref 26.0–34.0)
MCHC: 30.9 g/dL (ref 30.0–36.0)
MCV: 90.4 fL (ref 78.0–100.0)
Monocytes Absolute: 0.5 10*3/uL (ref 0.1–1.0)
Monocytes Relative: 9 %
Neutro Abs: 4.1 10*3/uL (ref 1.7–7.7)
Neutrophils Relative %: 68 %
Platelets: 98 10*3/uL — ABNORMAL LOW (ref 150–400)
RBC: 2.29 MIL/uL — ABNORMAL LOW (ref 3.87–5.11)
RDW: 18.4 % — ABNORMAL HIGH (ref 11.5–15.5)
WBC: 6 10*3/uL (ref 4.0–10.5)

## 2015-08-31 LAB — SAVE SMEAR

## 2015-08-31 LAB — BASIC METABOLIC PANEL
Anion gap: 7 (ref 5–15)
BUN: 33 mg/dL — ABNORMAL HIGH (ref 6–20)
CO2: 27 mmol/L (ref 22–32)
Calcium: 7.1 mg/dL — ABNORMAL LOW (ref 8.9–10.3)
Chloride: 106 mmol/L (ref 101–111)
Creatinine, Ser: 2.69 mg/dL — ABNORMAL HIGH (ref 0.44–1.00)
GFR calc Af Amer: 23 mL/min — ABNORMAL LOW (ref >60)
GFR calc non Af Amer: 20 mL/min — ABNORMAL LOW (ref >60)
Glucose, Bld: 92 mg/dL (ref 65–99)
Potassium: 4 mmol/L (ref 3.5–5.1)
Sodium: 140 mmol/L (ref 135–145)

## 2015-08-31 LAB — MAGNESIUM: Magnesium: 1.5 mg/dL — ABNORMAL LOW (ref 1.7–2.4)

## 2015-08-31 LAB — GLUCOSE, CAPILLARY
Glucose-Capillary: 93 mg/dL (ref 65–99)
Glucose-Capillary: 122 mg/dL — ABNORMAL HIGH (ref 65–99)
Glucose-Capillary: 117 mg/dL — ABNORMAL HIGH (ref 65–99)
Glucose-Capillary: 119 mg/dL — ABNORMAL HIGH (ref 65–99)

## 2015-08-31 LAB — PREPARE RBC (CROSSMATCH)

## 2015-08-31 LAB — RETICULOCYTES
RBC.: 2.55 MIL/uL — ABNORMAL LOW (ref 3.87–5.11)
Retic Count, Absolute: 104.6 10*3/uL (ref 19.0–186.0)
Retic Ct Pct: 4.1 % — ABNORMAL HIGH (ref 0.4–3.1)

## 2015-08-31 LAB — FERRITIN: Ferritin: 157 ng/mL (ref 11–307)

## 2015-08-31 MED ORDER — SODIUM CHLORIDE 0.9 % IV SOLN
Freq: Once | INTRAVENOUS | Status: AC
  Administered 2015-08-31: 19:00:00 via INTRAVENOUS

## 2015-08-31 MED ORDER — LEVOFLOXACIN 500 MG PO TABS
500.0000 mg | ORAL_TABLET | Freq: Every day | ORAL | Status: DC
  Administered 2015-08-31 – 2015-09-01 (×2): 500 mg via ORAL
  Filled 2015-08-31 (×2): qty 1

## 2015-08-31 NOTE — Progress Notes (Signed)
[name] looks pretty good today.  She had the skin biopsy. Her blood counts are about the same.  Her corrected retic count seems to be fairly adequate.  She has renal insufficiency.  I suspect that the EPO level is very low!!    The sed rate is awful high.    I think that the mild adenopathy is more reactive.  I reviewed the bone marrow bx and could not find anything that was abnormal.  Her appetite is pretty good. She's not having any nausea or vomiting.  I really think that this is some type of inflammatory response. Again she has had a chronic renal insufficiency. I suspect that she probably cannot make red cells all that well if her EPO level is low.  I also would check her iron levels. It'll be interesting to see what they look like.  I cannot find anything on her physical exam that looks suspicious. She has had the skin lesions. Again I wonder if these are some type of bacterial.  She did have the breast biopsy., She will her last mammogram was.  Hopefully, with treatment of this inflammatory type response, her blood counts will improve.  [name], MD  Psalm 56:4

## 2015-08-31 NOTE — Progress Notes (Signed)
Pharmacy Antibiotic Note  [name] is a 50 y.o. female admitted on 08/23/2015 with UTI.  Pharmacy has been consulted for levofloxacin dosing. Patient's renal function has improved Scr- 3.28>2.69.   Patient has been tolerating oral medications and a diet for >24 hours . Patient has also remained afebrile and WBC count is within normal limits. For these reasons, pharmacy will switch her levaquin from IV to PO for her complicated UTI.   Plan: Increase Levaquin to 500 mg q 24 hours due to improved renal function. Change Levaquin to PO due to ability to tolerate diet/oral medications and clinical status. Follow up clinical improvement and length of therapy.   Height: 5\' 7"  (170.2 cm) Weight: 237 lb 7 oz (107.7 kg) IBW/kg (Calculated) : 61.6  Temp (24hrs), Avg:98 F (36.7 C), Min:97.5 F (36.4 C), Max:98.3 F (36.8 C)   Recent Labs Lab 08/24/15 1015  08/27/15 0534 08/28/15 0255 08/29/15 0423 08/30/15 0416 08/30/15 0845 08/31/15 0424  WBC 15.1*  < > 8.2 9.8 9.5  --  6.7 6.0  CREATININE 8.24*  < > 5.35* 4.31* 3.42* 3.28*  --  2.69*  LATICACIDVEN 1.0  --   --   --   --   --   --   --   < > = values in this interval not displayed.  Estimated Creatinine Clearance: 32.3 mL/min (by C-G formula based on SCr of 2.69 mg/dL).    No Known Allergies  Antibiotics this admission:  8/19 LVQ>> 8/19 Flu>>  Dose adjustments this admission: 8/27- Levaquin from 500 mg Q 48 hours to Q 24 hours  Microbiology: 8/19 UCx: mult species 8/19 BCx2: ngf 8/20 MRSA PCR: neg Thank you for allowing pharmacy to be a part of this patient's care.  Delila SpenceEmily A Stewart, Pharm D Pharmacy Resident Pager: [phone] 08/31/2015 9:56 AM   I discussed / reviewed the pharmacy note by Dr. Roseanne RenoStewart and I agree with the resident's findings and plans as documented.  Toys 'R' UsKimberly Carlia Bomkamp, Pharm.D., BCPS Clinical Pharmacist Pager [phone] 08/31/2015 10:16 AM

## 2015-08-31 NOTE — Progress Notes (Signed)
PROGRESS NOTE    [name]  IOX:[id number] DOB: [birth year] DOA: 08/23/2015 PCP: [name], MD   Brief Narrative:   [name] is a 50 y.o. BF PMHx Intracerebral hemorrhage, Stroke DM type II uncontrolled with complications, S/P right BKA, HTN    Present emergency room chief complaint weakness. Patient states that over the last 4 week she's been becoming increasingly weak. She states that gradually she's also developed a feeling of lack of sensation in her extremities. This is specially gotten worse over the last week. Patient states that prior to this she didn't have any odd sensations of weakness or numbness. Patient states that things came to head when she got so weak the day of admission she could not get out of bed. For the last several days patient's children have helped her get around the house.  Patient complains of increasing anxiety over these issue. Positive for orthopnea. Denies sick contacts. Denies fever.  Patient does have a history of neuropathy. Due to cost she has not seen her physician in a year. She has also been off her medications for 1 year.   Subjective: 8/27 A/O 4 sitting in bed comfortably, requesting to know what abdominal CT showed, and how her kidneys are functioning.      Assessment & Plan:   Principal Problem:   Severe anemia Active Problems:   Hypertension   Diabetes mellitus ([place])   Acute renal failure (HCC)   Thrombocytopenia (HCC)   Pressure ulcer   UTI (lower urinary tract infection)   Absolute anemia   Acute on chronic renal failure (HCC)   Hydronephrosis determined by ultrasound   Papular rash   Uncontrolled type 2 diabetes mellitus with complication ([place])   Paresthesia   Rectovaginal fistula   Controlled diabetes mellitus type 2 with complications ([place])   Anemia   -Most likely multifactorial to include CKD, autoimmune (MCTD), malignancy? -8/19 transfused 2 units PRBC -8/20 transfused 2 units PRBC Recent  Labs Lab 08/27/15 0534 08/28/15 0255 08/29/15 0423 08/30/15 0845 08/31/15 0424  HGB 6.1* 7.2* 6.2* 6.8* 6.4*  -Transfuse for hemoglobin<7 -8/27 Transfuse 1 unit PRBC  Thrombocytopenia -8/20 transfused 2 units platelets -Hematology/Dr. [name] following -Bone Marrow Biopsy results not helpful see results below -Serum autoimmune labs pending -Transfuse for Hgb <7 and platelets less than 10K or active bleeding. -8/24 transfused 2 units platelets, prior to skin biopsy  Acute on CKD -Last Cr= 1.04 in 2013 patient has not seen a physician other than 1 time for the lesions which started a year ago. Most likely creatinine worsening over time -Renal ultrasound bilateral Hydronephrosis results below.  -Strict in and out since admission - 13.5 L Lab Results  Component Value Date   CREATININE 2.69 (H) 08/31/2015   CREATININE 3.28 (H) 08/30/2015   CREATININE 3.42 (H) 08/29/2015  -Nephrology consulted  Complicated UTI -Continue Levofloxacin 500 mg  q 48 hr for 7-14 days  Papular Rash/Dark plaque lesions covering entire body -Diabetic lesion vs autoimmune lesion vs malignancy vs calciphylaxis with fungal infection -Empiric Diflucan -Empiric Diflucan 50 mg daily -8/27 S/P biopsy  Cardiomegaly  - Seen on CXR. -8/21 echocardiogram normal see results below  HTN -Controlled  Hypomagnesemia -Magnesium 400 mg TID  Diabetes type 2 controlled with complications -S/P right BKA -Last hemoglobin A1c 11/2010= 12.4 -8/23 Hemoglobin A1c = 5.3  -Lipid panel; within ADA guideline -Resistant SSI  Weakness/Numbness/Tingling - No focal deficit.  -Most likely secondary to chronically severely uncontrolled diabetes -After patient more stable may  require Neurology consult for EMG  Rectovaginal fistula -CT abdomen pelvis with PO contrast: Shows possible rectovaginal fistula. -Per radiology Repeat CT pelvis with contrast per rectum pending.  Insomnia - Mild. -Trazodone qhs  prn    DVT prophylaxis: SCD Code Status: Full Family Communication: None Disposition Plan: ??   Consultants:  Dr. Ladell Pier oncology [facility] Christian Mate. [facility]/Dr Georganna Skeans. [facility] consult      Procedures/Significant Events:  CXR PA/LAT 8/19: Questionable small left pleural effusion. No focal consolidation appreciated. Silhouetting of left hemidiaphragm. Cardiomegaly. Renal U/S 8/20: Diffusely distended bladder & moderate bilateral hydronephrosis. Normal renal size and echogenicity. LLE Duplex 8/20:  No DVT 8/19 transfused 2 units PRBC 8/20 transfused 2 units PRBC 8/20 transfused 2 units platelets 8/21 Echocardiogram: -- Left ventricle: mild LVH. -LVEF = 60% to 65%. 8/24 transfuse 2 units platelets  8/24 CT Abdomen Pelvis W/ PO Contrast; -PO contrast did not reach the rectum: clear rectovaginal fistula is not demonstrated. -Persisting bilateral hydroureteronephrosis despite Foley catheter decompression of the inflamed bladder.  8/27 Transfuse 1 unit PRBC 8/27/punch biopsy completed results pending   LABS: CRP:  5.1 ESR:  108 C3 >> C4 >> IgG >>  SSA >>  SSB >>  A-Smith Ab >>  A-Jo Ab >>  ANA >>  ENA >>  Aldolase >>  CK:  269 SPEP >>  TSH:  5.461 Free T4:  0.79    Cultures 8/19 blood right hand/wrist NGTD  8/19 urine positive Multiple species 8/20 negative MRSA by PCR  8/21 Bone Marrow Bx HYPERCELLULAR BONE MARROW WITH TRILINEAGE HEMATOPOIESIS  Antimicrobials: Ceftriaxone x1 08/23/15 Levaquin 8/19 >> Fluconazole 8/19 >>   Devices    LINES / TUBES:  Foley 8/20 >> PIV x2    Continuous Infusions: . sodium chloride 50 mL/hr at 08/31/15 1600     Objective: Vitals:   08/31/15 0335 08/31/15 0715 08/31/15 1136 08/31/15 1500  BP: 132/74 (!) 141/70 112/71 126/63  Pulse: 84 88 90 90  Resp: 15 14 (!) 30 (!) 28  Temp: 98 F (36.7 C) 98.3 F (36.8 C) 97.8 F (36.6 C) 97.7 F (36.5 C)  TempSrc: Oral Oral Oral Oral   SpO2: 100% 100% 92% 97%  Weight: 107.7 kg (237 lb 7 oz)     Height:        Intake/Output Summary (Last 24 hours) at 08/31/15 1649 Last data filed at 08/31/15 1600  Gross per 24 hour  Intake             1870 ml  Output             6600 ml  Net            -4730 ml   Filed Weights   08/27/15 0500 08/28/15 0500 08/31/15 0335  Weight: 106.1 kg (233 lb 14.5 oz) 107.6 kg (237 lb 3.4 oz) 107.7 kg (237 lb 7 oz)    Examination:  General: A/O 4, NAD, No acute respiratory distress Eyes: negative scleral hemorrhage, negative anisocoria, negative icterus ENT: Negative Runny nose, negative gingival bleeding, Neck:  Negative scars, masses, torticollis, lymphadenopathy, JVD Lungs: Clear to auscultation bilaterally without wheezes or crackles Cardiovascular: Regular rate and rhythm without murmur gallop or rub normal S1 and S2 Abdomen: Morbidly obese, negative abdominal pain, nondistended, positive soft, bowel sounds, no rebound, no ascites, no appreciable mass Extremities/Skin: right BKA, left lower extremity edema 1-2+. Patient with multiple black plaques all over extremities. Pruritic/painful. These lesions or all over torso  face upper extremities. Underneath left breast area where skin/lesion has become ulcerous. All across breasts rough slightly raised rash across her chest. Psychiatric:  Negative depression, negative anxiety, negative fatigue, negative mania  Central nervous system:  Cranial nerves II through XII intact, tongue/uvula midline, all extremities muscle strength 5/5, sensation intact throughout, negative dysarthria, negative expressive aphasia, negative receptive aphasia.  .     Data Reviewed: Care during the described time interval was provided by me .  I have reviewed this patient's available data, including medical history, events of note, physical examination, and all test results as part of my evaluation. I have personally reviewed and interpreted all radiology  studies.  CBC:  Recent Labs Lab 08/26/15 1132  08/27/15 0534 08/28/15 0255 08/29/15 0423 08/30/15 0845 08/31/15 0424  WBC 8.4  --  8.2 9.8 9.5 6.7 6.0  NEUTROABS 6.5  --  6.3 6.8 7.1  --  4.1  HGB 7.4*  --  6.1* 7.2* 6.2* 6.8* 6.4*  HCT 22.6*  < > 19.0* 22.6* 19.8* 21.9* 20.7*  MCV 85.0  --  85.6 88.6 88.8 90.1 90.4  PLT 32*  --  51* 55* 90* 107* 98*  < > = values in this interval not displayed. Basic Metabolic Panel:  Recent Labs Lab 08/26/15 0434 08/27/15 0534 08/28/15 0255 08/29/15 0423 08/30/15 0416 08/31/15 0424  NA 142 143 142 140 140 140  K 3.6 3.3* 4.0 3.8 4.0 4.0  CL 110 104 103 102 104 106  CO2 '22 29 30 28 28 27  ' GLUCOSE 80 130* 107* 81 142* 92  BUN 73* 60* 50* 40* 37* 33*  CREATININE 6.37* 5.35* 4.31* 3.42* 3.28* 2.69*  CALCIUM 6.1* 6.0* 6.2* 6.3* 6.6* 7.1*  MG 1.6* 1.4* 1.7 1.4* 1.5* 1.5*  PHOS 5.4*  --   --  3.6 3.4 3.6   GFR: Estimated Creatinine Clearance: 32.3 mL/min (by C-G formula based on SCr of 2.69 mg/dL). Liver Function Tests:  Recent Labs Lab 08/26/15 0434 08/27/15 0534 08/28/15 0255 08/29/15 0423 08/30/15 0416  AST  --  13* 14*  --   --   ALT  --  9* 9*  --   --   ALKPHOS  --  63 59  --   --   BILITOT  --  0.6 0.6  --   --   PROT  --  5.4* 5.8*  --   --   ALBUMIN 2.2* 2.2* 2.4* 2.3* 2.4*   No results for input(s): LIPASE, AMYLASE in the last 168 hours. No results for input(s): AMMONIA in the last 168 hours. Coagulation Profile:  Recent Labs Lab 08/25/15 0424 08/26/15 1132 08/27/15 0534 08/30/15 0416  INR 1.41 1.40 1.44 1.39   Cardiac Enzymes: No results for input(s): CKTOTAL, CKMB, CKMBINDEX, TROPONINI in the last 168 hours. BNP (last 3 results) No results for input(s): PROBNP in the last 8760 hours. HbA1C: No results for input(s): HGBA1C in the last 72 hours. CBG:  Recent Labs Lab 08/30/15 1703 08/30/15 2128 08/30/15 2154 08/31/15 0834 08/31/15 1123  GLUCAP 121* 230* 120* 93 122*   Lipid Profile: No  results for input(s): CHOL, HDL, LDLCALC, TRIG, CHOLHDL, LDLDIRECT in the last 72 hours. Thyroid Function Tests: No results for input(s): TSH, T4TOTAL, FREET4, T3FREE, THYROIDAB in the last 72 hours. Anemia Panel:  Recent Labs  08/31/15 1233  FERRITIN 157  TIBC 265  IRON 41  RETICCTPCT 4.1*   Urine analysis:    Component Value Date/Time   COLORURINE YELLOW 08/23/2015  1630   APPEARANCEUR TURBID (A) 08/23/2015 1630   LABSPEC 1.014 08/23/2015 1630   PHURINE 6.0 08/23/2015 1630   GLUCOSEU NEGATIVE 08/23/2015 1630   HGBUR MODERATE (A) 08/23/2015 1630   BILIRUBINUR NEGATIVE 08/23/2015 1630   KETONESUR NEGATIVE 08/23/2015 1630   PROTEINUR 100 (A) 08/23/2015 1630   UROBILINOGEN 2.0 (H) 07/08/2011 0051   NITRITE NEGATIVE 08/23/2015 1630   LEUKOCYTESUR LARGE (A) 08/23/2015 1630   Sepsis Labs: '@LABRCNTIP' (procalcitonin:4,lacticidven:4)  ) Recent Results (from the past 240 hour(s))  Urine culture     Status: Abnormal   Collection Time: 08/23/15  4:30 PM  Result Value Ref Range Status   Specimen Description URINE, RANDOM  Final   Special Requests NONE  Final   Culture MULTIPLE SPECIES PRESENT, SUGGEST RECOLLECTION (A)  Final   Report Status 08/24/2015 FINAL  Final  Culture, blood (routine x 2)     Status: None   Collection Time: 08/23/15  8:58 PM  Result Value Ref Range Status   Specimen Description BLOOD RIGHT HAND  Final   Special Requests BOTTLES DRAWN AEROBIC ONLY 5CC  Final   Culture NO GROWTH 5 DAYS  Final   Report Status 08/28/2015 FINAL  Final  Culture, blood (routine x 2)     Status: None   Collection Time: 08/23/15  9:04 PM  Result Value Ref Range Status   Specimen Description BLOOD RIGHT WRIST  Final   Special Requests IN PEDIATRIC BOTTLE 3CC  Final   Culture NO GROWTH 5 DAYS  Final   Report Status 08/28/2015 FINAL  Final  MRSA PCR Screening     Status: None   Collection Time: 08/24/15 12:58 AM  Result Value Ref Range Status   MRSA by PCR NEGATIVE NEGATIVE Final     Comment:        The GeneXpert MRSA Assay (FDA approved for NASAL specimens only), is one component of a comprehensive MRSA colonization surveillance program. It is not intended to diagnose MRSA infection nor to guide or monitor treatment for MRSA infections.          Radiology Studies: Ct Pelvis Wo Contrast  Result Date: 08/30/2015 CLINICAL DATA:  Evaluate rectovaginal fistula. EXAM: CT PELVIS WITHOUT CONTRAST TECHNIQUE: Multidetector CT imaging of the pelvis was performed following the standard protocol without intravenous contrast. COMPARISON:  None. FINDINGS: Radiopaque material has been instilled in a retrograde fashion into the rectum and distal sigmoid colon. A balloon tipped catheter is seen within the rectum. No extraluminal contrast opacification is seen to suggest rectovaginal fistula. Foley catheter is in place. There is partially visualized left hydronephrosis. There is bilateral moderate hydroureter. The pelvic organs are poorly visualized due to lack of IV contrast. There is diffuse anasarca. Moderately enlarged bilateral inguinal lymph nodes are seen. IMPRESSION: No evidence of rectovaginal fistula. Poor visualization of the pelvic organs due to lack of IV contrast. Diffuse anasarca. Bilateral hydroureter and partially visualized left hydronephrosis. Moderately enlarged bilateral inguinal lymph nodes. Electronically Signed   By: Fidela Salisbury M.D.   On: 08/30/2015 19:49   US Renal  Result Date: 08/31/2015 CLINICAL DATA:  Acute renal failure. EXAM: RENAL / URINARY TRACT ULTRASOUND COMPLETE COMPARISON:  CT of the abdomen pelvis 08/29/2015 FINDINGS: Right Kidney: Length: 12.8 cm.  There is moderate hydronephrosis. Left Kidney: Length: 12.2 cm.  There is moderate hydronephrosis. Bladder: Urinary bladder is poorly visualized as it is decompressed around urinary Foley catheter. IMPRESSION: Bilateral moderate hydronephrosis without evidence of cortical thinning.  Electronically Signed   By: Thomas Hoff  Dimitrova M.D.   On: 08/31/2015 14:49        Scheduled Meds: . sodium chloride   Intravenous Once  . calcium carbonate  2 tablet Oral TID  . fluconazole (DIFLUCAN) IV  50 mg Intravenous Q24H  . insulin aspart  0-20 Units Subcutaneous TID WC  . insulin aspart  0-5 Units Subcutaneous QHS  . levofloxacin  500 mg Oral Daily  . magnesium oxide  400 mg Oral TID  . mouth rinse  15 mL Mouth Rinse BID   Continuous Infusions: . sodium chloride 50 mL/hr at 08/31/15 1600     LOS: 8 days    Time spent: 40 minutes    WOODS, Geraldo Docker, MD [facility] Pager [phone]   If 7PM-7AM, please contact night-coverage [email or web address] Password [facility] 08/31/2015, 4:49 PM

## 2015-08-31 NOTE — Progress Notes (Signed)
S: No new CO O:BP 132/74 (BP Location: Right Arm)   Pulse 84   Temp 98 F (36.7 C) (Oral)   Resp 15   Ht _0  (1.702 m)   Wt 107.7 kg (237 lb 7 oz)   LMP  (LMP Unknown)   SpO2 100%   BMI 37.19 kg/m   Intake/Output Summary (Last 24 hours) at 08/31/15 0806 Last data filed at 08/31/15 0500  Gross per 24 hour  Intake           1352.5 ml  Output             5650 ml  Net          -4297.5 ml   Weight change:  HMC:NOBSJ and alert CVS:RRR Resp:Clear Abd:+ BS NTND Ext:Rt BKA.  No edema.  Mult chronic pustular lesions over arms/legs/torso NEURO: Awake and alert, Ox3  No asterixis GU urine in foley yellow   . sodium chloride   Intravenous Once  . calcium carbonate  2 tablet Oral TID  . fluconazole (DIFLUCAN) IV  50 mg Intravenous Q24H  . insulin aspart  0-20 Units Subcutaneous TID WC  . insulin aspart  0-5 Units Subcutaneous QHS  . levofloxacin (LEVAQUIN) IV  500 mg Intravenous Q48H  . magnesium oxide  400 mg Oral TID  . mouth rinse  15 mL Mouth Rinse BID   Ct Pelvis Wo Contrast  Result Date: 08/30/2015 CLINICAL DATA:  Evaluate rectovaginal fistula. EXAM: CT PELVIS WITHOUT CONTRAST TECHNIQUE: Multidetector CT imaging of the pelvis was performed following the standard protocol without intravenous contrast. COMPARISON:  None. FINDINGS: Radiopaque material has been instilled in a retrograde fashion into the rectum and distal sigmoid colon. A balloon tipped catheter is seen within the rectum. No extraluminal contrast opacification is seen to suggest rectovaginal fistula. Foley catheter is in place. There is partially visualized left hydronephrosis. There is bilateral moderate hydroureter. The pelvic organs are poorly visualized due to lack of IV contrast. There is diffuse anasarca. Moderately enlarged bilateral inguinal lymph nodes are seen. IMPRESSION: No evidence of rectovaginal fistula. Poor visualization of the pelvic organs due to lack of IV contrast. Diffuse anasarca. Bilateral  hydroureter and partially visualized left hydronephrosis. Moderately enlarged bilateral inguinal lymph nodes. Electronically Signed   By: Fidela Salisbury M.D.   On: 08/30/2015 19:49   BMET    Component Value Date/Time   NA 140 08/31/2015 0424   K 4.0 08/31/2015 0424   CL 106 08/31/2015 0424   CO2 27 08/31/2015 0424   GLUCOSE 92 08/31/2015 0424   BUN 33 (H) 08/31/2015 0424   CREATININE 2.69 (H) 08/31/2015 0424   CALCIUM 7.1 (L) 08/31/2015 0424   GFRNONAA 20 (L) 08/31/2015 0424   GFRAA 23 (L) 08/31/2015 0424   CBC    Component Value Date/Time   WBC 6.0 08/31/2015 0424   RBC 2.29 (L) 08/31/2015 0424   HGB 6.4 (LL) 08/31/2015 0424   HCT 20.7 (L) 08/31/2015 0424   HCT 20.8 (L) 08/26/2015 1436   PLT 98 (L) 08/31/2015 0424   MCV 90.4 08/31/2015 0424   MCH 27.9 08/31/2015 0424   MCHC 30.9 08/31/2015 0424   RDW 18.4 (H) 08/31/2015 0424   LYMPHSABS 1.2 08/31/2015 0424   MONOABS 0.5 08/31/2015 0424   EOSABS 0.2 08/31/2015 0424   BASOSABS 0.0 08/31/2015 0424     Assessment:  1. Presumably ARF in setting of Bil hydronephrosis,  probable BOO as UO excellent after foley placement and Scr cont to decrease  2. Anemia/thrombocytopenia  ? Etiology   BM unrevealing  3. DM 4.  Chronic diabetic skin lesions 5. Met acidosis, improved 6. Hypocalcemia on oral replacement 7. Hypomag 8. Hypokalemia, improved Plan: 1.  UO remains quite high.  She is drinking a fair amt orally but it is not being recorded 2. Will need urologic eval 3. Will recheck renal US in AM 4. Daily labs  Mahagony Grieb T

## 2015-08-31 NOTE — Procedures (Signed)
Procedure: Punch bx x2, left breast and left thigh  Indication: Left breast lesion, multiple extremity lesions  Surgeon: Charma IgoMichael Nyliah Nierenberg, PA-C  Assist: None  Anesthesia: 8ml 1% plain lidocaine  EBL: <55ml  Complications: Nonoe  Findings: Risks/benefits explained to pt and verbal and written consent were obtained. Time out performed. Lidocaine was infiltrated around each lesion taking care not to disturb the lesion itself. Once adequate anesthesia was confirmed a 6mm punch biopsy was obtained from each lesion. The wounds were dressed. The patient tolerated the procedure well.    Freeman CaldronMichael J. Haralambos Yeatts, PA-C Pager: [phone]

## 2015-08-31 NOTE — Progress Notes (Signed)
Rehab Admissions Coordinator Note:  Patient was screened by Clois DupesBoyette, Rachna Schonberger Godwin for appropriateness for an Inpatient Acute Rehab Consult per PT recommendation. Pt previously a pt 07/2011 in [facility]. At this time, we are recommending Inpatient Rehab consult. Please place order if pt would like to be considered for admission.   Clois DupesBoyette, Doloras Tellado Godwin 08/31/2015, 9:35 AM  I can be reached at [phone].

## 2015-09-01 LAB — CBC WITH DIFFERENTIAL/PLATELET
Basophils Absolute: 0 10*3/uL (ref 0.0–0.1)
Basophils Relative: 0 %
Eosinophils Absolute: 0.2 10*3/uL (ref 0.0–0.7)
Eosinophils Relative: 3 %
HCT: 24.6 % — ABNORMAL LOW (ref 36.0–46.0)
Hemoglobin: 7.6 g/dL — ABNORMAL LOW (ref 12.0–15.0)
Lymphocytes Relative: 18 %
Lymphs Abs: 1.2 10*3/uL (ref 0.7–4.0)
MCH: 27.8 pg (ref 26.0–34.0)
MCHC: 30.9 g/dL (ref 30.0–36.0)
MCV: 90.1 fL (ref 78.0–100.0)
Monocytes Absolute: 0.5 10*3/uL (ref 0.1–1.0)
Monocytes Relative: 7 %
Neutro Abs: 4.7 10*3/uL (ref 1.7–7.7)
Neutrophils Relative %: 71 %
Platelets: 94 10*3/uL — ABNORMAL LOW (ref 150–400)
RBC: 2.73 MIL/uL — ABNORMAL LOW (ref 3.87–5.11)
RDW: 17.4 % — ABNORMAL HIGH (ref 11.5–15.5)
WBC: 6.6 10*3/uL (ref 4.0–10.5)

## 2015-09-01 LAB — RENAL FUNCTION PANEL
Albumin: 2.5 g/dL — ABNORMAL LOW (ref 3.5–5.0)
Anion gap: 6 (ref 5–15)
BUN: 31 mg/dL — ABNORMAL HIGH (ref 6–20)
CO2: 27 mmol/L (ref 22–32)
Calcium: 7.4 mg/dL — ABNORMAL LOW (ref 8.9–10.3)
Chloride: 106 mmol/L (ref 101–111)
Creatinine, Ser: 2.56 mg/dL — ABNORMAL HIGH (ref 0.44–1.00)
GFR calc Af Amer: 24 mL/min — ABNORMAL LOW (ref >60)
GFR calc non Af Amer: 21 mL/min — ABNORMAL LOW (ref >60)
Glucose, Bld: 104 mg/dL — ABNORMAL HIGH (ref 65–99)
Phosphorus: 3.8 mg/dL (ref 2.5–4.6)
Potassium: 4.5 mmol/L (ref 3.5–5.1)
Sodium: 139 mmol/L (ref 135–145)

## 2015-09-01 LAB — GLUCOSE, CAPILLARY
Glucose-Capillary: 135 mg/dL — ABNORMAL HIGH (ref 65–99)
Glucose-Capillary: 95 mg/dL (ref 65–99)
Glucose-Capillary: 120 mg/dL — ABNORMAL HIGH (ref 65–99)
Glucose-Capillary: 128 mg/dL — ABNORMAL HIGH (ref 65–99)
Glucose-Capillary: 103 mg/dL — ABNORMAL HIGH (ref 65–99)

## 2015-09-01 LAB — TYPE AND SCREEN
ABO/RH(D): B POS
Antibody Screen: NEGATIVE
Unit division: 0

## 2015-09-01 LAB — MAGNESIUM: Magnesium: 1.5 mg/dL — ABNORMAL LOW (ref 1.7–2.4)

## 2015-09-01 LAB — CHROMOSOME ANALYSIS, BONE MARROW

## 2015-09-01 LAB — LACTATE DEHYDROGENASE: LDH: 175 U/L (ref 98–192)

## 2015-09-01 MED ORDER — MAGNESIUM SULFATE 2 GM/50ML IV SOLN
2.0000 g | Freq: Once | INTRAVENOUS | Status: AC
  Administered 2015-09-02: 2 g via INTRAVENOUS
  Filled 2015-09-01: qty 50

## 2015-09-01 NOTE — Progress Notes (Signed)
[place] TEAM 1 - Stepdown/ICU TEAM  [name]  UYE:[id number] DOB: [birth year] DOA: 08/23/2015 PCP: [name], MD    Brief Narrative:  50 y.o.F Hx Intracerebral hemorrhage, Stroke, DM2, S/P right BKA, and HTN who presented to the ED for weakness progressive over 4 weeks she's been becoming increasingly weak.  She had not seen her physician in a year. She had also been off her medications for 1 year.  Subjective: The patient has no new complaints at this time.  She reports that she is feeling better in general.  She denies chest pain shortness of breath fevers chills nausea vomiting or abdominal pain.  Assessment & Plan:  Severe Anemia w/o evidence of acute blood loss  -multifactorial to include CKD, autoimmune (MCTD), malignancy? -Transfuse as needed to keep hemoglobin 7.0 or greater -Hematology is following with [place] - bone marrow appears healthy/without abnormality  Thrombocytopenia -Hematology following -Bone Marrow Biopsy without abnormality -Transfuse as needed for platelets less than 10K or active bleeding  Acute on CKD -Cr 1.04 in 2013  -[place] noted mod B hydronephrosis, but this was in the setting of acute urinary retention - Foley catheter has since been placed and creatinine is slowly improving -follow-up imaging notes some persisting hydronephrosis despite Foley drained bladder - Nephrology has suggested Urology eval - will consult in AM   Recent Labs Lab 08/28/15 0255 08/29/15 0423 08/30/15 0416 08/31/15 0424 09/01/15 0416  CREATININE 4.31* 3.42* 3.28* 2.69* 2.56*    UTI  -has completed a course of levaquin - urine culture not helpful - stop abx now and follow   Hypokalemia  -replaced  Hypomagnesemia -replace and follow - goal is 2.0   Hypocalcemia   Papular Rash / Dark plaque lesions covering entire body -Diabetic lesion vs autoimmune lesion vs malignancy vs calciphylaxis with fungal infection - these are reportedly chronic  -Skin biopsy has  been accomplished with pathology results pending  -empiric Diflucan trial initiated by Dr. Sherral Hammers  HTN -Controlled  DM2 uncontrolled  -S/P right BKA -A1c 5.3 - CBG currently controlled  Weakness / Numbness / Tingling- No focal deficit -Most likely secondary to chronically severely uncontrolled diabetes v/s profound anemia v/s hypocalcemia -follow clinically w/ correction of each issue    Rectovaginal fistula? -Patient denies any vaginal discharge to me today whatsoever -CT abdomen pelvis reveals no evidence of this  DVT prophylaxis: SCDs Code Status: FULL CODE Family Communication: no family present at time of exam  Disposition Plan: PT/OT - Urology evaluation   Consultants:  Hematology  Nephrology   Procedures: none  Antimicrobials:  Ceftriaxone 8/19 Levaquin 8/19 > 8/28 Diflucan 8/19 >  Objective: Blood pressure (!) 127/96, pulse 90, temperature 98.3 F (36.8 C), temperature source Oral, resp. rate (!) 24, height _0  (1.702 m), weight 107.7 kg (237 lb 7 oz), SpO2 99 %.  Intake/Output Summary (Last 24 hours) at 09/01/15 1218 Last data filed at 09/01/15 1200  Gross per 24 hour  Intake          2123.33 ml  Output             6150 ml  Net         -4026.67 ml   Filed Weights   08/27/15 0500 08/28/15 0500 08/31/15 0335  Weight: 106.1 kg (233 lb 14.5 oz) 107.6 kg (237 lb 3.4 oz) 107.7 kg (237 lb 7 oz)    Examination: General: No acute respiratory distress Lungs: Clear to auscultation bilaterally - no wheezes or crackles Cardiovascular: Regular  rate and rhythm without murmur  Abdomen: Nontender, nondistended, soft, bowel sounds positive, no rebound, no ascites, no appreciable mass Extremities: No significant cyanosis, clubbing, edema bilateral lower extremities  CBC:  Recent Labs Lab 08/27/15 0534 08/28/15 0255 08/29/15 0423 08/30/15 0845 08/31/15 0424 09/01/15 0416  WBC 8.2 9.8 9.5 6.7 6.0 6.6  NEUTROABS 6.3 6.8 7.1  --  4.1 4.7  HGB 6.1* 7.2*  6.2* 6.8* 6.4* 7.6*  HCT 19.0* 22.6* 19.8* 21.9* 20.7* 24.6*  MCV 85.6 88.6 88.8 90.1 90.4 90.1  PLT 51* 55* 90* 107* 98* 94*   Basic Metabolic Panel:  Recent Labs Lab 08/26/15 0434  08/28/15 0255 08/29/15 0423 08/30/15 0416 08/31/15 0424 09/01/15 0416  NA 142  < > 142 140 140 140 139  K 3.6  < > 4.0 3.8 4.0 4.0 4.5  CL 110  < > 103 102 104 106 106  CO2 22  < > _0 GLUCOSE 80  < > 107* 81 142* 92 104*  BUN 73*  < > 50* 40* 37* 33* 31*  CREATININE 6.37*  < > 4.31* 3.42* 3.28* 2.69* 2.56*  CALCIUM 6.1*  < > 6.2* 6.3* 6.6* 7.1* 7.4*  MG 1.6*  < > 1.7 1.4* 1.5* 1.5* 1.5*  PHOS 5.4*  --   --  3.6 3.4 3.6 3.8  < > = values in this interval not displayed. GFR: Estimated Creatinine Clearance: 33.9 mL/min (by C-G formula based on SCr of 2.56 mg/dL).  Liver Function Tests:  Recent Labs Lab 08/27/15 0534 08/28/15 0255 08/29/15 0423 08/30/15 0416 09/01/15 0416  AST 13* 14*  --   --   --   ALT 9* 9*  --   --   --   ALKPHOS 63 59  --   --   --   BILITOT 0.6 0.6  --   --   --   PROT 5.4* 5.8*  --   --   --   ALBUMIN 2.2* 2.4* 2.3* 2.4* 2.5*    Coagulation Profile:  Recent Labs Lab 08/26/15 1132 08/27/15 0534 08/30/15 0416  INR 1.40 1.44 1.39    Cardiac Enzymes: No results for input(s): CKTOTAL, CKMB, CKMBINDEX, TROPONINI in the last 168 hours.  HbA1C: Hgb A1c MFr Bld  Date/Time Value Ref Range Status  08/27/2015 05:34 AM 5.3 4.8 - 5.6 % Final    Comment:    (NOTE)         Pre-diabetes: 5.7 - 6.4         Diabetes: >6.4         Glycemic control for adults with diabetes: <7.0   11/23/2010 12:51 AM 12.4 (H) <5.7 % Final    Comment:    (NOTE)                                                                       According to the ADA Clinical Practice Recommendations for 2011, when HbA1c is used as a screening test:  >=6.5%   Diagnostic of Diabetes Mellitus           (if abnormal result is confirmed) 5.7-6.4%   Increased risk of developing Diabetes  [place] References:Diagnosis and Classification of Diabetes Mellitus,Diabetes ZOXW,9604,54(UJWJX 1):S62-S69 and Standards  of Medical Care in         Diabetes - 2011,Diabetes Care,2011,34 (Suppl 1):S11-S61.    CBG:  Recent Labs Lab 08/31/15 1123 08/31/15 1705 08/31/15 2104 09/01/15 0846 09/01/15 1155  GLUCAP 122* 117* 119* 135* 95    Recent Results (from the past 240 hour(s))  Urine culture     Status: Abnormal   Collection Time: 08/23/15  4:30 PM  Result Value Ref Range Status   Specimen Description URINE, RANDOM  Final   Special Requests NONE  Final   Culture MULTIPLE SPECIES PRESENT, SUGGEST RECOLLECTION (A)  Final   Report Status 08/24/2015 FINAL  Final  Culture, blood (routine x 2)     Status: None   Collection Time: 08/23/15  8:58 PM  Result Value Ref Range Status   Specimen Description BLOOD RIGHT HAND  Final   Special Requests BOTTLES DRAWN AEROBIC ONLY 5CC  Final   Culture NO GROWTH 5 DAYS  Final   Report Status 08/28/2015 FINAL  Final  Culture, blood (routine x 2)     Status: None   Collection Time: 08/23/15  9:04 PM  Result Value Ref Range Status   Specimen Description BLOOD RIGHT WRIST  Final   Special Requests IN PEDIATRIC BOTTLE 3CC  Final   Culture NO GROWTH 5 DAYS  Final   Report Status 08/28/2015 FINAL  Final  MRSA PCR Screening     Status: None   Collection Time: 08/24/15 12:58 AM  Result Value Ref Range Status   MRSA by PCR NEGATIVE NEGATIVE Final    Comment:        The GeneXpert MRSA Assay (FDA approved for NASAL specimens only), is one component of a comprehensive MRSA colonization surveillance program. It is not intended to diagnose MRSA infection nor to guide or monitor treatment for MRSA infections.      Scheduled Meds: . sodium chloride   Intravenous Once  . calcium carbonate  2 tablet Oral TID  . fluconazole (DIFLUCAN) IV  50 mg Intravenous Q24H  . insulin aspart  0-20 Units Subcutaneous TID WC  . insulin aspart  0-5 Units  Subcutaneous QHS  . levofloxacin  500 mg Oral Daily  . magnesium oxide  400 mg Oral TID  . mouth rinse  15 mL Mouth Rinse BID   Continuous Infusions: . sodium chloride Stopped (08/31/15 1858)     LOS: 9 days   Cherene Altes, MD [facility] Office  [phone] Pager - Text Page per Amion as per below:  On-Call/Text Page:      [email or web address]      password [email or web address]  If 7PM-7AM, please contact night-coverage [email or web address] Password [facility] 09/01/2015, 12:18 PM

## 2015-09-01 NOTE — Progress Notes (Signed)
Subjective: NAE  Objective: Vital signs in last 24 hours: Temp:  [97.6 F (36.4 C)-98.5 F (36.9 C)] 98.1 F (36.7 C) (08/28 0243) Pulse Rate:  [81-93] 81 (08/28 0243) Resp:  [14-30] 19 (08/28 0243) BP: (112-140)/(63-83) 136/69 (08/28 0243) SpO2:  [92 %-100 %] 100 % (08/28 0243) Last BM Date: 08/30/15  Intake/Output from previous day: 08/27 0701 - 08/28 0700 In: 2363.3 [P.O.:1200; I.V.:773.3; Blood:365; IV Piggyback:25] Out: 6150 [Urine:6150] Intake/Output this shift: No intake/output data recorded.  General appearance: alert and cooperative bx sites c/d/i  Lab Results:   Recent Labs  08/31/15 0424 09/01/15 0416  WBC 6.0 6.6  HGB 6.4* 7.6*  HCT 20.7* 24.6*  PLT 98* 94*   BMET  Recent Labs  08/31/15 0424 09/01/15 0416  NA 140 139  K 4.0 4.5  CL 106 106  CO2 27 27  GLUCOSE 92 104*  BUN 33* 31*  CREATININE 2.69* 2.56*  CALCIUM 7.1* 7.4*   PT/INR  Recent Labs  08/30/15 0416  LABPROT 17.2*  INR 1.39   ABG No results for input(s): PHART, HCO3 in the last 72 hours.  Invalid input(s): PCO2, PO2  Studies/Results: Ct Pelvis Wo Contrast  Result Date: 08/30/2015 CLINICAL DATA:  Evaluate rectovaginal fistula. EXAM: CT PELVIS WITHOUT CONTRAST TECHNIQUE: Multidetector CT imaging of the pelvis was performed following the standard protocol without intravenous contrast. COMPARISON:  None. FINDINGS: Radiopaque material has been instilled in a retrograde fashion into the rectum and distal sigmoid colon. A balloon tipped catheter is seen within the rectum. No extraluminal contrast opacification is seen to suggest rectovaginal fistula. Foley catheter is in place. There is partially visualized left hydronephrosis. There is bilateral moderate hydroureter. The pelvic organs are poorly visualized due to lack of IV contrast. There is diffuse anasarca. Moderately enlarged bilateral inguinal lymph nodes are seen. IMPRESSION: No evidence of rectovaginal fistula. Poor  visualization of the pelvic organs due to lack of IV contrast. Diffuse anasarca. Bilateral hydroureter and partially visualized left hydronephrosis. Moderately enlarged bilateral inguinal lymph nodes. Electronically Signed   By: Ted Mcalpineobrinka  Dimitrova M.D.   On: 08/30/2015 19:49   [place] Renal  Result Date: 08/31/2015 CLINICAL DATA:  Acute renal failure. EXAM: RENAL / URINARY TRACT ULTRASOUND COMPLETE COMPARISON:  CT of the abdomen pelvis 08/29/2015 FINDINGS: Right Kidney: Length: 12.8 cm.  There is moderate hydronephrosis. Left Kidney: Length: 12.2 cm.  There is moderate hydronephrosis. Bladder: Urinary bladder is poorly visualized as it is decompressed around urinary Foley catheter. IMPRESSION: Bilateral moderate hydronephrosis without evidence of cortical thinning. Electronically Signed   By: Ted Mcalpineobrinka  Dimitrova M.D.   On: 08/31/2015 14:49    Anti-infectives: Anti-infectives    Start     Dose/Rate Route Frequency Ordered Stop   08/31/15 1000  levofloxacin (LEVAQUIN) tablet 500 mg     500 mg Oral Daily 08/31/15 0955     08/26/15 0000  fluconazole (DIFLUCAN) IVPB 50 mg     50 mg 25 mL/hr over 60 Minutes Intravenous Every 24 hours 08/25/15 2235     08/23/15 2345  levofloxacin (LEVAQUIN) IVPB 500 mg  Status:  Discontinued     500 mg 100 mL/hr over 60 Minutes Intravenous Every 48 hours 08/23/15 2341 08/31/15 0955   08/23/15 2345  fluconazole (DIFLUCAN) IVPB 50 mg  Status:  Discontinued     50 mg 25 mL/hr over 60 Minutes Intravenous Every 24 hours 08/23/15 2341 08/25/15 2235   08/23/15 1900  cefTRIAXone (ROCEPHIN) 1 g in dextrose 5 % 50 mL IVPB  Status:  Discontinued     1 g 100 mL/hr over 30 Minutes Intravenous Every 24 hours 08/23/15 1825 08/23/15 2036      Assessment/Plan: 50 y/o Skin lesions  Principal Problem:   Severe anemia Active Problems:   Hypertension   Diabetes mellitus (HCC)   Acute renal failure (HCC)   Thrombocytopenia (HCC)   Pressure ulcer   UTI (lower urinary tract  infection)   Absolute anemia   Acute on chronic renal failure (HCC)   Hydronephrosis determined by ultrasound   Papular rash   Uncontrolled type 2 diabetes mellitus with complication (HCC)   Paresthesia   Rectovaginal fistula   Controlled diabetes mellitus type 2 with complications (HCC)   Occult blood positive stool  Bx sites look good con't local wound care. Call with questions    LOS: 9 days    Marigene Ehlers., [facility] 09/01/2015

## 2015-09-01 NOTE — Progress Notes (Signed)
S: No acute events.  Pt attempting to get up and walk on the side of the bed with PT   O:BP (!) 127/96 (BP Location: Right Arm)   Pulse 90   Temp 98.3 F (36.8 C) (Oral)   Resp (!) 24   Ht '5\' 7"'  (1.702 m)   Wt 107.7 kg (237 lb 7 oz)   LMP  (LMP Unknown)   SpO2 99%   BMI 37.19 kg/m   Intake/Output Summary (Last 24 hours) at 09/01/15 1216 Last data filed at 09/01/15 1200  Gross per 24 hour  Intake          2123.33 ml  Output             6150 ml  Net         -4026.67 ml   Weight change:  Gen: sitting on edge of bed. CVS:RRR Resp:Clear Abd:+ BS NTND Ext:Rt BKA.  No edema.  Mult chronic pustular lesions over arms/legs/torso NEURO: Awake and alert, Ox3  No asterixis GU clear pale yellow urine in Foley   . sodium chloride   Intravenous Once  . calcium carbonate  2 tablet Oral TID  . fluconazole (DIFLUCAN) IV  50 mg Intravenous Q24H  . insulin aspart  0-20 Units Subcutaneous TID WC  . insulin aspart  0-5 Units Subcutaneous QHS  . levofloxacin  500 mg Oral Daily  . magnesium oxide  400 mg Oral TID  . mouth rinse  15 mL Mouth Rinse BID   Ct Pelvis Wo Contrast  Result Date: 08/30/2015 CLINICAL DATA:  Evaluate rectovaginal fistula. EXAM: CT PELVIS WITHOUT CONTRAST TECHNIQUE: Multidetector CT imaging of the pelvis was performed following the standard protocol without intravenous contrast. COMPARISON:  None. FINDINGS: Radiopaque material has been instilled in a retrograde fashion into the rectum and distal sigmoid colon. A balloon tipped catheter is seen within the rectum. No extraluminal contrast opacification is seen to suggest rectovaginal fistula. Foley catheter is in place. There is partially visualized left hydronephrosis. There is bilateral moderate hydroureter. The pelvic organs are poorly visualized due to lack of IV contrast. There is diffuse anasarca. Moderately enlarged bilateral inguinal lymph nodes are seen. IMPRESSION: No evidence of rectovaginal fistula. Poor visualization  of the pelvic organs due to lack of IV contrast. Diffuse anasarca. Bilateral hydroureter and partially visualized left hydronephrosis. Moderately enlarged bilateral inguinal lymph nodes. Electronically Signed   By: Fidela Salisbury M.D.   On: 08/30/2015 19:49   US Renal  Result Date: 08/31/2015 CLINICAL DATA:  Acute renal failure. EXAM: RENAL / URINARY TRACT ULTRASOUND COMPLETE COMPARISON:  CT of the abdomen pelvis 08/29/2015 FINDINGS: Right Kidney: Length: 12.8 cm.  There is moderate hydronephrosis. Left Kidney: Length: 12.2 cm.  There is moderate hydronephrosis. Bladder: Urinary bladder is poorly visualized as it is decompressed around urinary Foley catheter. IMPRESSION: Bilateral moderate hydronephrosis without evidence of cortical thinning. Electronically Signed   By: Fidela Salisbury M.D.   On: 08/31/2015 14:49   BMET    Component Value Date/Time   NA 139 09/01/2015 0416   K 4.5 09/01/2015 0416   CL 106 09/01/2015 0416   CO2 27 09/01/2015 0416   GLUCOSE 104 (H) 09/01/2015 0416   BUN 31 (H) 09/01/2015 0416   CREATININE 2.56 (H) 09/01/2015 0416   CALCIUM 7.4 (L) 09/01/2015 0416   GFRNONAA 21 (L) 09/01/2015 0416   GFRAA 24 (L) 09/01/2015 0416   CBC    Component Value Date/Time   WBC 6.6 09/01/2015 0416   RBC  2.73 (L) 09/01/2015 0416   HGB 7.6 (L) 09/01/2015 0416   HCT 24.6 (L) 09/01/2015 0416   HCT 20.8 (L) 08/26/2015 1436   PLT 94 (L) 09/01/2015 0416   MCV 90.1 09/01/2015 0416   MCH 27.8 09/01/2015 0416   MCHC 30.9 09/01/2015 0416   RDW 17.4 (H) 09/01/2015 0416   LYMPHSABS 1.2 09/01/2015 0416   MONOABS 0.5 09/01/2015 0416   EOSABS 0.2 09/01/2015 0416   BASOSABS 0.0 09/01/2015 0416     Assessment/ Plan:  1. ARF in setting of Bil hydronephrosis,  probable BOO as UO excellent after foley placement and Scr cont to decrease.  Repeat renal US showing persistence of bilateral hydro- would have expected this to improve more but pt has robust urine output with downtrending  creatinine.  Recommend urologic evaluation. 2. Anemia/thrombocytopenia of unclear etiology.  BM biopsy has been unrevealing. Iron % sat is 15, would begin supplementation with oral ferrous sulfate.   3. DM- likely causing a neurogenic bladder 4.  Chronic diabetic skin lesions, awaiting biopsy results. 5. Met acidosis, improved 6. Hypocalcemia on oral replacement 7. Hypomag 8. Hypokalemia, improved  Madelon Lips  [facility] pgr [phone] cell: [phone]

## 2015-09-01 NOTE — Care Management Note (Signed)
Case Management Note  Patient Details  Name: Cheryl HansenMichelle L Wolf MRN: [id number] Date of Birth: 08/01/1965  Subjective/Objective:  [name] the financial counselor came down to see patient so she could sign papers for medicaid.  Awaiting bx from lesions.                  Action/Plan:   Expected Discharge Date:                  Expected Discharge Plan:  Home w Home Health Services  In-House Referral:     Discharge planning Services  CM Consult  Post Acute Care Choice:    Choice offered to:     DME Arranged:    DME Agency:     HH Arranged:    HH Agency:     Status of Service:  In process, will continue to follow  If discussed at Long Length of Stay Meetings, dates discussed:    Additional Comments:  [name], Cheryl Branan Clinton, RN 09/01/2015, 5:38 PM

## 2015-09-01 NOTE — Progress Notes (Signed)
Physical Therapy Treatment Patient Details Name: Earvin HansenMichelle L Hallenbeck MRN: [id number] DOB: 12/04/1965 Today's Date: 09/01/2015    History of Present Illness Patient is a 50 yo female admitted 08/23/15 with general weakness, severe anemia (Hgb 4.2), UTI, AKI, skin lesions.    PMH:  DM, neuropathy, h/o Rt BKA, HTN, ARF, anxiety, ICH, CVA    PT Comments    Pt able to tolerate OOB to chair today. Remains weak and continue to feel she could benefit from CIR. Pt motivated to work toward return to prior level of function.  Follow Up Recommendations  CIR;Supervision for mobility/OOB     Equipment Recommendations  3in1 (PT)    Recommendations for Other Services Rehab consult;OT consult     Precautions / Restrictions Precautions Precautions: Fall Restrictions Weight Bearing Restrictions: No    Mobility  Bed Mobility Overal bed mobility: Needs Assistance Bed Mobility: Supine to Sit     Supine to sit: HOB elevated;Min assist     General bed mobility comments: Assist to elevate trunk and bring hips to EOB  Transfers Overall transfer level: Needs assistance Equipment used: Rolling walker (2 wheeled);Ambulation equipment used Transfers: Sit to/from [employer]GI CorporationStand;Stand Pivot Transfers Sit to Stand: +2 physical assistance;Mod assist Stand pivot transfers: +2 physical assistance;Max assist (Use of Corene CorneaSara Stedy)       General transfer comment: Assist to bring hips and trunk up. Pt stood with walker for ~30-45 sec. Unsure it pt could perform pivot with walker so returned to sitting EOB and brought Corene CorneaSara Stedy in to use for the stand pivot.  Ambulation/Gait                 Stairs            Wheelchair Mobility    Modified Rankin (Stroke Patients Only)       Balance Overall balance assessment: Needs assistance Sitting-balance support: No upper extremity supported;Feet supported Sitting balance-Leahy Scale: Good     Standing balance support: Bilateral upper extremity  supported Standing balance-Leahy Scale: Poor Standing balance comment: Walker and +332min A for static standing                    Cognition Arousal/Alertness: Awake/alert Behavior During Therapy: Anxious Overall Cognitive Status: Within Functional Limits for tasks assessed                      Exercises      General Comments        Pertinent Vitals/Pain Pain Assessment: No/denies pain    Home Living                      Prior Function            PT Goals (current goals can now be found in the care plan section) Progress towards PT goals: Progressing toward goals    Frequency  Min 3X/week    PT Plan Current plan remains appropriate    Co-evaluation             End of Session Equipment Utilized During Treatment: Gait belt Activity Tolerance: Patient tolerated treatment well Patient left: with call bell/phone within reach;in chair;with chair alarm set     Time: 1203-1232 PT Time Calculation (min) (ACUTE ONLY): 29 min  Charges:  $Therapeutic Activity: 23-37 mins                    G Codes:      Bluford Sedler 09/01/2015, 12:52  PM [employer] PT [phone]

## 2015-09-02 DIAGNOSIS — R0682 Tachypnea, not elsewhere classified: Secondary | ICD-10-CM

## 2015-09-02 DIAGNOSIS — E118 Type 2 diabetes mellitus with unspecified complications: Secondary | ICD-10-CM

## 2015-09-02 DIAGNOSIS — Z91199 Patient's noncompliance with other medical treatment and regimen due to unspecified reason: Secondary | ICD-10-CM

## 2015-09-02 DIAGNOSIS — E1142 Type 2 diabetes mellitus with diabetic polyneuropathy: Secondary | ICD-10-CM

## 2015-09-02 DIAGNOSIS — Z89519 Acquired absence of unspecified leg below knee: Secondary | ICD-10-CM

## 2015-09-02 DIAGNOSIS — Z9119 Patient's noncompliance with other medical treatment and regimen: Secondary | ICD-10-CM

## 2015-09-02 DIAGNOSIS — N319 Neuromuscular dysfunction of bladder, unspecified: Secondary | ICD-10-CM

## 2015-09-02 DIAGNOSIS — N182 Chronic kidney disease, stage 2 (mild): Secondary | ICD-10-CM

## 2015-09-02 DIAGNOSIS — L989 Disorder of the skin and subcutaneous tissue, unspecified: Secondary | ICD-10-CM

## 2015-09-02 DIAGNOSIS — E1165 Type 2 diabetes mellitus with hyperglycemia: Secondary | ICD-10-CM

## 2015-09-02 DIAGNOSIS — N189 Chronic kidney disease, unspecified: Secondary | ICD-10-CM

## 2015-09-02 DIAGNOSIS — D62 Acute posthemorrhagic anemia: Secondary | ICD-10-CM

## 2015-09-02 DIAGNOSIS — Z89511 Acquired absence of right leg below knee: Secondary | ICD-10-CM

## 2015-09-02 LAB — RENAL FUNCTION PANEL
Albumin: 2.4 g/dL — ABNORMAL LOW (ref 3.5–5.0)
Anion gap: 7 (ref 5–15)
BUN: 30 mg/dL — ABNORMAL HIGH (ref 6–20)
CO2: 25 mmol/L (ref 22–32)
Calcium: 7.9 mg/dL — ABNORMAL LOW (ref 8.9–10.3)
Chloride: 108 mmol/L (ref 101–111)
Creatinine, Ser: 2.35 mg/dL — ABNORMAL HIGH (ref 0.44–1.00)
GFR calc Af Amer: 27 mL/min — ABNORMAL LOW (ref >60)
GFR calc non Af Amer: 23 mL/min — ABNORMAL LOW (ref >60)
Glucose, Bld: 99 mg/dL (ref 65–99)
Phosphorus: 3.6 mg/dL (ref 2.5–4.6)
Potassium: 4.6 mmol/L (ref 3.5–5.1)
Sodium: 140 mmol/L (ref 135–145)

## 2015-09-02 LAB — CBC WITH DIFFERENTIAL/PLATELET
Basophils Absolute: 0 10*3/uL (ref 0.0–0.1)
Basophils Relative: 1 %
Eosinophils Absolute: 0.2 10*3/uL (ref 0.0–0.7)
Eosinophils Relative: 3 %
HCT: 23.1 % — ABNORMAL LOW (ref 36.0–46.0)
Hemoglobin: 7.1 g/dL — ABNORMAL LOW (ref 12.0–15.0)
Lymphocytes Relative: 17 %
Lymphs Abs: 1.1 10*3/uL (ref 0.7–4.0)
MCH: 27.6 pg (ref 26.0–34.0)
MCHC: 30.7 g/dL (ref 30.0–36.0)
MCV: 89.9 fL (ref 78.0–100.0)
Monocytes Absolute: 0.5 10*3/uL (ref 0.1–1.0)
Monocytes Relative: 7 %
Neutro Abs: 4.7 10*3/uL (ref 1.7–7.7)
Neutrophils Relative %: 72 %
Platelets: 91 10*3/uL — ABNORMAL LOW (ref 150–400)
RBC: 2.57 MIL/uL — ABNORMAL LOW (ref 3.87–5.11)
RDW: 17 % — ABNORMAL HIGH (ref 11.5–15.5)
WBC: 6.6 10*3/uL (ref 4.0–10.5)

## 2015-09-02 LAB — GLUCOSE, CAPILLARY
Glucose-Capillary: 99 mg/dL (ref 65–99)
Glucose-Capillary: 127 mg/dL — ABNORMAL HIGH (ref 65–99)
Glucose-Capillary: 140 mg/dL — ABNORMAL HIGH (ref 65–99)
Glucose-Capillary: 141 mg/dL — ABNORMAL HIGH (ref 65–99)
Glucose-Capillary: 147 mg/dL — ABNORMAL HIGH (ref 65–99)

## 2015-09-02 LAB — MAGNESIUM: Magnesium: 2.1 mg/dL (ref 1.7–2.4)

## 2015-09-02 LAB — ERYTHROPOIETIN: Erythropoietin: 8.1 m[IU]/mL (ref 2.6–18.5)

## 2015-09-02 LAB — LACTATE DEHYDROGENASE: LDH: 161 U/L (ref 98–192)

## 2015-09-02 MED ORDER — PSYLLIUM 95 % PO PACK
1.0000 | PACK | Freq: Every day | ORAL | Status: DC | PRN
  Filled 2015-09-02: qty 1

## 2015-09-02 MED ORDER — DARBEPOETIN ALFA 60 MCG/0.3ML IJ SOSY
60.0000 ug | PREFILLED_SYRINGE | Freq: Once | INTRAMUSCULAR | Status: AC
  Administered 2015-09-02: 60 ug via SUBCUTANEOUS
  Filled 2015-09-02: qty 0.3

## 2015-09-02 MED ORDER — SODIUM CHLORIDE 0.9 % IV SOLN
125.0000 mg | INTRAVENOUS | Status: DC
  Administered 2015-09-02 – 2015-09-04 (×2): 125 mg via INTRAVENOUS
  Filled 2015-09-02 (×3): qty 10

## 2015-09-02 MED ORDER — SODIUM CHLORIDE 0.9 % IV SOLN
125.0000 mg | Freq: Every day | INTRAVENOUS | Status: DC
  Filled 2015-09-02: qty 10

## 2015-09-02 NOTE — Care Management Note (Signed)
Case Management Note  Patient Details  Name: Earvin HansenMichelle L Gellatly MRN: [id number] Date of Birth: 02/13/1965  Subjective/Objective:   Pt eval rec CIR, they would like to see how patient does in another therapy session. Financial counselor brought papers down for patient to sign yesterday to apply for medicaid.  NCM will cont to follow for dc needs.                   Action/Plan:   Expected Discharge Date:                  Expected Discharge Plan:  IP Rehab Facility  In-House Referral:     Discharge planning Services  CM Consult  Post Acute Care Choice:    Choice offered to:     DME Arranged:    DME Agency:     HH Arranged:    HH Agency:     Status of Service:  In process, will continue to follow  If discussed at Long Length of Stay Meetings, dates discussed:    Additional Comments:  Leone Havenaylor, Rashae Rother Clinton, RN 09/02/2015, 7:06 PM

## 2015-09-02 NOTE — Consult Note (Signed)
Physical Medicine and Rehabilitation Consult  Reason for Consult: Debility in patient with severe anemia and R-BKA in setting of peripheral neuropathy Referring Physician: Dr. Thereasa Solo.    HPI: [name] is a 50 y.o. female with history of T2DM with neuropathy, ICH, diabetic foot ulcer s/p R-BKA 7/13, medication non-compliance for past year who was admitted on 08/23/15 with malaise, inability to walk, dyspnea and numbness of her hands.  She was found to have multiple skin lesions with metabolic acidosis, severe normocytic-normochromic anemia--hgb 4.8 and thrombocytopenia - 10.   She was found to have acute renal failure and renal ultrasound showed normal kidneys with distended bladder and bilateral hydronephrosis. She was transfused with 2 units PRBC and one unit platelets and started on Levaquin for UTI. Dr [name] consulted for work up and recommended skin biopsy as well as autoimmune studies. Bone marrow biopsy negative and skin bx path pending.  Dr. Justin Mend consulted for input and felt that assumed ARF in setting of bilateral hydronephrosis and BOO. Urine output improved with foley and SCr on downward trend. He felt lesions likely chronic diabetic lesions and recommends GU consult as well as repeat renal ultrasound today.  PT evaluation done showing problems with mobilty and CIR recommended for follow up therapy.   Was in rehab in 2013 and discharged with follow up therapy which was declined.  Was able to take a couple of hopping steps till few weeks ago--started having to slide her leg due to worsening of neuropathic symptoms.   Review of Systems  HENT: Negative for hearing loss.   Eyes: Negative for blurred vision and double vision.  Respiratory: Negative for cough and shortness of breath.   Cardiovascular: Negative for chest pain and palpitations.  Gastrointestinal: Positive for vomiting (daily for past 2 months. ). Negative for abdominal pain, constipation, heartburn and nausea.    Genitourinary: Negative for dysuria and frequency.  Musculoskeletal: Positive for back pain (chronic low back pain). Negative for falls, joint pain and neck pain.  Neurological: Positive for sensory change (LLE--totally numb for past year. Numbness bilateral hands for past 2 weeks.). Negative for headaches.  Psychiatric/Behavioral: Negative for memory loss.  All other systems reviewed and are negative.     Past Medical History:  Diagnosis Date  . Arthritis   . Diabetes mellitus   . Diabetic foot ulcer associated with type 2 diabetes mellitus ([place])   . Hypertension   . Intracerebral hemorrhage ([place]) As a teenager    States she had burst blood vessel as teenager, now with resultant minor visual field and hearing deficits  . Neuropathy ([place])   . Stroke [facility])     Past Surgical History:  Procedure Laterality Date  . AMPUTATION  11/24/2010   Procedure: AMPUTATION FOOT;  Surgeon: Wylene Simmer, MD;  Location: [place];  Service: Orthopedics;  Laterality: Right;  Right  FOOT TRANS METATARSAL AMPUTATION  . AMPUTATION  07/02/2011   Procedure: AMPUTATION BELOW KNEE;  Surgeon: Wylene Simmer, MD;  Location: [place];  Service: Orthopedics;  Laterality: Right;  . APPLICATION OF WOUND VAC  07/02/2011   Procedure: APPLICATION OF WOUND VAC;  Surgeon: Wylene Simmer, MD;  Location: [place];  Service: Orthopedics;  Laterality: Right;  . BRAIN SURGERY  1980's   Previous brain surgery in [place], [place]  . CESAREAN SECTION    . I&D EXTREMITY  11/24/2010   Procedure: IRRIGATION AND DEBRIDEMENT EXTREMITY;  Surgeon: Wylene Simmer, MD;  Location: [place];  Service: Orthopedics;  Laterality:  Left;  Debriedment of left plantar ulcer and trimming of toenails  . I&D EXTREMITY  07/02/2011   Procedure: IRRIGATION AND DEBRIDEMENT EXTREMITY;  Surgeon: Wylene Simmer, MD;  Location: [place];  Service: Orthopedics;  Laterality: Left;  I & D of Left Foot  . I&D EXTREMITY  07/06/2011   Procedure: IRRIGATION AND DEBRIDEMENT EXTREMITY;  Surgeon:  Wylene Simmer, MD;  Location: [place];  Service: Orthopedics;  Laterality: Right;  IRRIGATION/DEBRIDEMENT RIGHT BELOW KNEE AMPUTATION    Family History  Problem Relation Age of Onset  . Diabetes Mother   . Hypertension Mother   . Hyperlipidemia Mother   . Diabetes Father   . Cancer Father     prostate  . Hyperlipidemia Father   . Hypertension Father   . Diabetes Brother   . Peripheral Artery Disease Brother     has had toes amputated    Social History:   Lives with two children--mother from [place] with children at this time.  Independent at wheelchair level. reports that she has never smoked. She has never used smokeless tobacco. She reports that she does not drink alcohol or use drugs.    Allergies: No Known Allergies    Medications Prior to Admission  Medication Sig Dispense Refill  . aspirin 81 MG EC tablet Take 162-243 mg by mouth 3 (three) times daily as needed (chest pain). Swallow whole.    . Aspirin-Acetaminophen-Caffeine (GOODY HEADACHE PO) Take 1 packet by mouth 2 (two) times daily as needed (headache).    . Menthol, Topical Analgesic, (ICY HOT EX) Apply 1 application topically 2 (two) times daily as needed (pain).    . Naphazoline HCl (CLEAR EYES OP) Place 1 drop into both eyes daily as needed (dry eyes/itching).    . naproxen sodium (ALEVE) 220 MG tablet Take 440 mg by mouth 2 (two) times daily as needed (pain).    Marland Kitchen amLODipine (NORVASC) 10 MG tablet Take 1 tablet (10 mg total) by mouth daily. 30 tablet 0  . insulin aspart (NOVOLOG) 100 UNIT/ML injection Inject 3 Units into the skin 3 (three) times daily with meals. 1 vial 0    Home: Home Living Family/patient expects to be discharged to:: Private residence Living Arrangements: Children (52 yo and 59 yo) Available Help at Discharge: Family (Mom is in town from [place] to help.) Type of Home: House Home Access: Stairs to enter Technical brewer of Steps: 1 Entrance Stairs-Rails: None Home Layout: Two level,  Able to live on main level with bedroom/bathroom Home Equipment: Environmental consultant - 2 wheels, Wheelchair - manual  Functional History: Prior Function Level of Independence: Independent with assistive device(s) Comments: Patient does not use her prosthesis.  Uses w/c for mobility, and walker for bathroom.  Uses cabs for transportation, but having trouble getting into/out of cab with weakness. Functional Status:  Mobility: Bed Mobility Overal bed mobility: Needs Assistance Bed Mobility: Supine to Sit Supine to sit: HOB elevated, Min assist Sit to supine: Min assist, HOB elevated General bed mobility comments: Assist to elevate trunk and bring hips to EOB Transfers Overall transfer level: Needs assistance Equipment used: Rolling walker (2 wheeled), Ambulation equipment used Transfer via Lift Equipment: Stedy Transfers: Sit to/from Stand, Risk manager Sit to Stand: +2 physical assistance, Mod assist Stand pivot transfers: +2 physical assistance, Max assist (Use of Denna Haggard) General transfer comment: Assist to bring hips and trunk up. Pt stood with walker for ~30-45 sec. Unsure it pt could perform pivot with walker so returned to sitting  EOB and brought Denna Haggard in to use for the stand pivot.      ADL:    Cognition: Cognition Overall Cognitive Status: Within Functional Limits for tasks assessed Orientation Level: Oriented X4 Cognition Arousal/Alertness: Awake/alert Behavior During Therapy: Anxious Overall Cognitive Status: Within Functional Limits for tasks assessed  Blood pressure 124/68, pulse 90, temperature 99.5 F (37.5 C), temperature source Oral, resp. rate (!) 24, height _0  (1.702 m), weight 107 kg (235 lb 14.3 oz), SpO2 96 %. Physical Exam  Nursing note and vitals reviewed. Constitutional: She is oriented to person, place, and time. She appears well-developed and well-nourished.  Obese  HENT:  Head: Normocephalic and atraumatic.  Mouth/Throat: Oropharynx is  clear and moist.  Eyes: Conjunctivae and EOM are normal. Pupils are equal, round, and reactive to light.  Neck: Normal range of motion. Neck supple.  Cardiovascular: Normal rate and regular rhythm.   No murmur heard. Respiratory: Effort normal and breath sounds normal.  GI: Soft. Bowel sounds are normal. She exhibits no distension. There is no tenderness.  Musculoskeletal: She exhibits edema. She exhibits no tenderness.  R-BKA well healed. No edema  Neurological: She is alert and oriented to person, place, and time.  Speech clear and follows commands without difficulty.   Sensory deficits LLE, right residual limb and bilateral hands.  Motor: B/l UE: 4/5 proximal to distal LLE: 4/5 hip flexion, knee extension, 4+/5 ankle dorsi/plantar flexion RLE: 4+/5 hip flexion, knee extension  Skin: Skin is warm and dry. Rash noted.  Diffuse skin lesions  Psychiatric: She has a normal mood and affect. Her behavior is normal. Thought content normal.    Results for orders placed or performed during the hospital encounter of 08/23/15 (from the past 24 hour(s))  Glucose, capillary     Status: Abnormal   Collection Time: 09/01/15  8:46 AM  Result Value Ref Range   Glucose-Capillary 135 (H) 65 - 99 mg/dL   Comment 1 Notify RN    Comment 2 Document in Chart   Glucose, capillary     Status: None   Collection Time: 09/01/15 11:55 AM  Result Value Ref Range   Glucose-Capillary 95 65 - 99 mg/dL   Comment 1 Notify RN    Comment 2 Document in Chart   Glucose, capillary     Status: Abnormal   Collection Time: 09/01/15  4:28 PM  Result Value Ref Range   Glucose-Capillary 120 (H) 65 - 99 mg/dL   Comment 1 Notify RN    Comment 2 Document in Chart   Glucose, capillary     Status: Abnormal   Collection Time: 09/01/15  6:04 PM  Result Value Ref Range   Glucose-Capillary 128 (H) 65 - 99 mg/dL  Glucose, capillary     Status: Abnormal   Collection Time: 09/01/15  9:07 PM  Result Value Ref Range    Glucose-Capillary 103 (H) 65 - 99 mg/dL  Magnesium     Status: None   Collection Time: 09/02/15  4:27 AM  Result Value Ref Range   Magnesium 2.1 1.7 - 2.4 mg/dL  CBC with Differential/Platelet     Status: Abnormal   Collection Time: 09/02/15  4:27 AM  Result Value Ref Range   WBC 6.6 4.0 - 10.5 K/uL   RBC 2.57 (L) 3.87 - 5.11 MIL/uL   Hemoglobin 7.1 (L) 12.0 - 15.0 g/dL   HCT 23.1 (L) 36.0 - 46.0 %   MCV 89.9 78.0 - 100.0 fL   MCH 27.6 26.0 -  34.0 pg   MCHC 30.7 30.0 - 36.0 g/dL   RDW 17.0 (H) 11.5 - 15.5 %   Platelets 91 (L) 150 - 400 K/uL   Neutrophils Relative % 72 %   Neutro Abs 4.7 1.7 - 7.7 K/uL   Lymphocytes Relative 17 %   Lymphs Abs 1.1 0.7 - 4.0 K/uL   Monocytes Relative 7 %   Monocytes Absolute 0.5 0.1 - 1.0 K/uL   Eosinophils Relative 3 %   Eosinophils Absolute 0.2 0.0 - 0.7 K/uL   Basophils Relative 1 %   Basophils Absolute 0.0 0.0 - 0.1 K/uL  Lactate dehydrogenase     Status: None   Collection Time: 09/02/15  4:27 AM  Result Value Ref Range   LDH 161 98 - 192 U/L  Renal function panel     Status: Abnormal   Collection Time: 09/02/15  4:27 AM  Result Value Ref Range   Sodium 140 135 - 145 mmol/L   Potassium 4.6 3.5 - 5.1 mmol/L   Chloride 108 101 - 111 mmol/L   CO2 25 22 - 32 mmol/L   Glucose, Bld 99 65 - 99 mg/dL   BUN 30 (H) 6 - 20 mg/dL   Creatinine, Ser 2.35 (H) 0.44 - 1.00 mg/dL   Calcium 7.9 (L) 8.9 - 10.3 mg/dL   Phosphorus 3.6 2.5 - 4.6 mg/dL   Albumin 2.4 (L) 3.5 - 5.0 g/dL   GFR calc non Af Amer 23 (L) >60 mL/min   GFR calc Af Amer 27 (L) >60 mL/min   Anion gap 7 5 - 15  Glucose, capillary     Status: None   Collection Time: 09/02/15  8:19 AM  Result Value Ref Range   Glucose-Capillary 99 65 - 99 mg/dL   Comment 1 Notify RN    Comment 2 Document in Chart    US Renal  Result Date: 08/31/2015 CLINICAL DATA:  Acute renal failure. EXAM: RENAL / URINARY TRACT ULTRASOUND COMPLETE COMPARISON:  CT of the abdomen pelvis 08/29/2015 FINDINGS: Right  Kidney: Length: 12.8 cm.  There is moderate hydronephrosis. Left Kidney: Length: 12.2 cm.  There is moderate hydronephrosis. Bladder: Urinary bladder is poorly visualized as it is decompressed around urinary Foley catheter. IMPRESSION: Bilateral moderate hydronephrosis without evidence of cortical thinning. Electronically Signed   By: Fidela Salisbury M.D.   On: 08/31/2015 14:49    Assessment/Plan: Diagnosis: Debility  Labs and images independently reviewed.  Records reviewed and summated above.  1. Does the need for close, 24 hr/day medical supervision in concert with the patient's rehab needs make it unreasonable for this patient to be served in a less intensive setting? Yes  2. Co-Morbidities requiring supervision/potential complications: E0FH with neuropathy (Monitor in accordance with exercise and adjust meds as necessary), hx of R-BKA (adjust prosthesis), medication non-compliance (educate), multiple skin lesions (follow bx results), UTI (cont meds), tachypnea (monitor RR and O2 Sats with increased physical exertion), AKI/?CKD (avoid nephrotoxic meds), hypoalbuminemia (protein supplementation), ABLA on chronic anemia (transfuse again if necessary to ensure appropriate perfusion for increased activity tolerance), Thrombocytopenia (< 60,000/mm3 no resistive exercise), neurogenic bladder (timed voids, medications if necessary, I/O cath vs. Indwelling foley) 3. Due to bladder management, safety, skin/wound care, disease management and patient education, does the patient require 24 hr/day rehab nursing? Yes 4. Does the patient require coordinated care of a physician, rehab nurse, PT (1-2 hrs/day, 5 days/week) and OT (1-2 hrs/day, 5 days/week) to address physical and functional deficits in the context of the above  medical diagnosis(es)? Yes Addressing deficits in the following areas: balance, endurance, locomotion, strength, transferring, bowel/bladder control, toileting and psychosocial  support 5. Can the patient actively participate in an intensive therapy program of at least 3 hrs of therapy per day at least 5 days per week? Potentially 6. The potential for patient to make measurable gains while on inpatient rehab is excellent 7. Anticipated functional outcomes upon discharge from inpatient rehab are supervision and min assist  with PT, supervision and min assist with OT, n/a with SLP. 8. Estimated rehab length of stay to reach the above functional goals is: 16-19 days. 9. Does the patient have adequate social supports and living environment to accommodate these discharge functional goals? No 10. Anticipated D/C setting: Home 11. Anticipated post D/C treatments: HH therapy and Home excercise program 12. Overall Rehab/Functional Prognosis: good  RECOMMENDATIONS: This patient's condition is appropriate for continued rehabilitative care in the following setting: Would consider CIR consult to improve mobility and strength prior to discharge, if pt has caregiver support.  Currently, pt living with children, who attend school during the day. Will await medical workup and demonstrate ability to tolerate 3 hours therapy/day.  Patient has agreed to participate in recommended program. Yes Note that insurance prior authorization may be required for reimbursement for recommended care.  Comment: Rehab Admissions Coordinator to follow up.  Delice Lesch, MD 09/02/2015

## 2015-09-02 NOTE — Progress Notes (Signed)
PROGRESS NOTE    [name]  JQB:[id number] DOB: December 24, 1965 DOA: 08/23/2015 PCP: [name], MD   Brief Narrative:   [name] is a 50 y.o. BF PMHx Intracerebral hemorrhage, Stroke DM type II uncontrolled with complications, S/P right BKA, HTN    Present emergency room chief complaint weakness. Patient states that over the last 4 week she's been becoming increasingly weak. She states that gradually she's also developed a feeling of lack of sensation in her extremities. This is specially gotten worse over the last week. Patient states that prior to this she didn't have any odd sensations of weakness or numbness. Patient states that things came to head when she got so weak the day of admission she could not get out of bed. For the last several days patient's children have helped her get around the house.  Patient complains of increasing anxiety over these issue. Positive for orthopnea. Denies sick contacts. Denies fever.  Patient does have a history of neuropathy. Due to cost she has not seen her physician in a year. She has also been off her medications for 1 year.   Subjective: 8/29 A/O 4 sitting in bed comfortably. States has had difficulty having BM today feels constipated     Assessment & Plan:   Principal Problem:   Severe anemia Active Problems:   Hypertension   Diabetes mellitus ([place])   Acute renal failure (HCC)   Thrombocytopenia (HCC)   Pressure ulcer   UTI (lower urinary tract infection)   Absolute anemia   Acute on chronic renal failure (HCC)   Hydronephrosis determined by ultrasound   Papular rash   Uncontrolled type 2 diabetes mellitus with complication ([place])   Paresthesia   Rectovaginal fistula   Controlled diabetes mellitus type 2 with complications ([place])   Occult blood positive stool   AKI (acute kidney injury) ([place])   DM type 2 with diabetic peripheral neuropathy ([place])   S/P BKA (below knee amputation) unilateral (HCC)   Medical  non-compliance   Benign skin lesion of multiple sites   Tachypnea   Acute blood loss anemia   Neurogenic bladder   Anemia   -Most likely multifactorial to include CKD, autoimmune (MCTD), malignancy? -8/19 transfused 2 units PRBC -8/20 transfused 2 units PRBC Recent Labs Lab 08/29/15 0423 08/30/15 0845 08/31/15 0424 09/01/15 0416 09/02/15 0427  HGB 6.2* 6.8* 6.4* 7.6* 7.1*  -Transfuse for hemoglobin<7 -8/27 Transfuse 1 unit PRBC  Thrombocytopenia -8/20 transfused 2 units platelets -Hematology/Dr. [name] following -Bone Marrow Biopsy results not helpful see results below -Serum autoimmune labs pending -Transfuse for Hgb <7 and platelets less than 10K or active bleeding. -8/24 transfused 2 units platelets, prior to skin biopsy  Acute on CKD -Last Cr= 1.04 in 2013 patient has not seen a physician other than 1 time for the lesions which started a year ago. Most likely creatinine worsening over time -Renal ultrasound bilateral Hydronephrosis results below.  -Strict in and out since admission - 22 L Lab Results  Component Value Date   CREATININE 2.35 (H) 09/02/2015   CREATININE 2.56 (H) 09/01/2015   CREATININE 2.69 (H) 08/31/2015  -Nephrology consulted -[id number] consulted  Complicated UTI -Completed 10 day course antibiotics  Papular Rash/Dark plaque lesions covering entire body -Diabetic lesion vs autoimmune lesion vs malignancy vs calciphylaxis with fungal infection -Empiric Diflucan -Empiric Diflucan 50 mg daily -8/27 S/P biopsy: Still awaiting results  Cardiomegaly  - Seen on CXR. -8/21 echocardiogram normal see results below  HTN -Controlled  Hypomagnesemia  Diabetes type 2 controlled with complications -S/P right BKA -Last hemoglobin A1c 11/2010= 12.4 -8/23 Hemoglobin A1c = 5.3  -Lipid panel; within ADA guideline -Resistant SSI  Weakness/Numbness/Tingling - No focal deficit.  -Most likely secondary to chronically severely uncontrolled  diabetes (Diabetic Neuropathy) -After patient more stable may require Neurology consult for EMG  Rectovaginal fistula -CT abdomen pelvis with PO contrast: Shows possible rectovaginal fistula. -Per radiology Repeat CT pelvis with contrast per rectum; negative   Insomnia - Mild. -Trazodone qhs prn    DVT prophylaxis: SCD Code Status: Full Family Communication: None Disposition Plan: ??   Consultants:  Dr. Ladell Pier oncology [facility] Christian Mate. [place]/Dr Georganna Skeans. [facility] consult      Procedures/Significant Events:  CXR PA/LAT 8/19: Questionable small left pleural effusion. No focal consolidation appreciated. Silhouetting of left hemidiaphragm. Cardiomegaly. Renal U/S 8/20: Diffusely distended bladder & moderate bilateral hydronephrosis. Normal renal size and echogenicity. LLE Duplex 8/20:  No DVT 8/19 transfused 2 units PRBC 8/20 transfused 2 units PRBC 8/20 transfused 2 units platelets 8/21 Echocardiogram: -- Left ventricle: mild LVH. -LVEF = 60% to 65%. 8/21 Bone Marrow Bx HYPERCELLULAR BONE MARROW WITH TRILINEAGE HEMATOPOIESIS 8/24 transfuse 2 units platelets  8/24 CT Abdomen Pelvis W/ PO Contrast; -PO contrast did not reach the rectum: clear rectovaginal fistula is not demonstrated. -Persisting bilateral hydroureteronephrosis despite Foley catheter decompression of the inflamed bladder.  8/27 Transfuse 1 unit PRBC 8/27/punch biopsy completed results pending   LABS: CRP:  5.1 ESR:  108 C3 >> C4 >> IgG >>  SSA >>  SSB >>  A-Smith Ab >>  A-Jo Ab >>  ANA >>  ENA >>  Aldolase >>  CK:  269 SPEP >>  TSH:  5.461 Free T4:  0.79    Cultures 8/19 blood right hand/wrist NGTD  8/19 urine positive Multiple species 8/20 negative MRSA by PCR    Antimicrobials: Ceftriaxone x1 08/23/15 Levaquin 8/19 >> 8/28 Fluconazole 8/19 >>   Devices    LINES / TUBES:  Foley 8/20 >> PIV x2    Continuous Infusions: . sodium chloride  50 mL/hr at 09/02/15 1621     Objective: Vitals:   09/02/15 0822 09/02/15 1227 09/02/15 1340 09/02/15 1447  BP: 124/68 (!) 144/66 (!) 144/66 132/70  Pulse: 90 93 95 99  Resp: (!) 24 19 (!) 46 (!) 27  Temp: 99.5 F (37.5 C) 98.4 F (36.9 C) 97.8 F (36.6 C) 98 F (36.7 C)  TempSrc: Oral Oral Oral Oral  SpO2: 96% 100%  98%  Weight:      Height:        Intake/Output Summary (Last 24 hours) at 09/02/15 1700 Last data filed at 09/02/15 1447  Gross per 24 hour  Intake             2075 ml  Output             6901 ml  Net            -4826 ml   Filed Weights   08/28/15 0500 08/31/15 0335 09/02/15 0500  Weight: 107.6 kg (237 lb 3.4 oz) 107.7 kg (237 lb 7 oz) 107 kg (235 lb 14.3 oz)    Examination:  General: A/O 4, NAD, No acute respiratory distress Eyes: negative scleral hemorrhage, negative anisocoria, negative icterus ENT: Negative Runny nose, negative gingival bleeding, Neck:  Negative scars, masses, torticollis, lymphadenopathy, JVD Lungs: Clear to auscultation bilaterally without wheezes or crackles Cardiovascular: Regular rate and rhythm without  murmur gallop or rub normal S1 and S2 Abdomen: Morbidly obese, negative abdominal pain, nondistended, positive soft, bowel sounds, no rebound, no ascites, no appreciable mass Extremities/Skin: right BKA, left lower extremity edema 1+. Patient with multiple black plaques all over extremities. Pruritic/painful. These lesions or all over torso face upper extremities. Underneath left breast area where skin/lesion has become ulcerous. All across breasts rough slightly raised rash across her chest. Psychiatric:  Negative depression, negative anxiety, negative fatigue, negative mania  Central nervous system:  Cranial nerves II through XII intact, tongue/uvula midline, all extremities muscle strength 5/5, sensation intact throughout, negative dysarthria, negative expressive aphasia, negative receptive aphasia.  .     Data Reviewed: Care  during the described time interval was provided by me .  I have reviewed this patient's available data, including medical history, events of note, physical examination, and all test results as part of my evaluation. I have personally reviewed and interpreted all radiology studies.  CBC:  Recent Labs Lab 08/28/15 0255 08/29/15 0423 08/30/15 0845 08/31/15 0424 09/01/15 0416 09/02/15 0427  WBC 9.8 9.5 6.7 6.0 6.6 6.6  NEUTROABS 6.8 7.1  --  4.1 4.7 4.7  HGB 7.2* 6.2* 6.8* 6.4* 7.6* 7.1*  HCT 22.6* 19.8* 21.9* 20.7* 24.6* 23.1*  MCV 88.6 88.8 90.1 90.4 90.1 89.9  PLT 55* 90* 107* 98* 94* 91*   Basic Metabolic Panel:  Recent Labs Lab 08/29/15 0423 08/30/15 0416 08/31/15 0424 09/01/15 0416 09/02/15 0427  NA 140 140 140 139 140  K 3.8 4.0 4.0 4.5 4.6  CL 102 104 106 106 108  CO2 '28 28 27 27 25  ' GLUCOSE 81 142* 92 104* 99  BUN 40* 37* 33* 31* 30*  CREATININE 3.42* 3.28* 2.69* 2.56* 2.35*  CALCIUM 6.3* 6.6* 7.1* 7.4* 7.9*  MG 1.4* 1.5* 1.5* 1.5* 2.1  PHOS 3.6 3.4 3.6 3.8 3.6   GFR: Estimated Creatinine Clearance: 36.9 mL/min (by C-G formula based on SCr of 2.35 mg/dL). Liver Function Tests:  Recent Labs Lab 08/27/15 0534 08/28/15 0255 08/29/15 0423 08/30/15 0416 09/01/15 0416 09/02/15 0427  AST 13* 14*  --   --   --   --   ALT 9* 9*  --   --   --   --   ALKPHOS 63 59  --   --   --   --   BILITOT 0.6 0.6  --   --   --   --   PROT 5.4* 5.8*  --   --   --   --   ALBUMIN 2.2* 2.4* 2.3* 2.4* 2.5* 2.4*   No results for input(s): LIPASE, AMYLASE in the last 168 hours. No results for input(s): AMMONIA in the last 168 hours. Coagulation Profile:  Recent Labs Lab 08/27/15 0534 08/30/15 0416  INR 1.44 1.39   Cardiac Enzymes: No results for input(s): CKTOTAL, CKMB, CKMBINDEX, TROPONINI in the last 168 hours. BNP (last 3 results) No results for input(s): PROBNP in the last 8760 hours. HbA1C: No results for input(s): HGBA1C in the last 72 hours. CBG:  Recent  Labs Lab 09/01/15 2107 09/02/15 0819 09/02/15 1208 09/02/15 1339 09/02/15 1619  GLUCAP 103* 99 127* 140* 141*   Lipid Profile: No results for input(s): CHOL, HDL, LDLCALC, TRIG, CHOLHDL, LDLDIRECT in the last 72 hours. Thyroid Function Tests: No results for input(s): TSH, T4TOTAL, FREET4, T3FREE, THYROIDAB in the last 72 hours. Anemia Panel:  Recent Labs  08/31/15 1233  FERRITIN 157  TIBC 265  IRON 41  RETICCTPCT 4.1*   Urine analysis:    Component Value Date/Time   COLORURINE YELLOW 08/23/2015 1630   APPEARANCEUR TURBID (A) 08/23/2015 1630   LABSPEC 1.014 08/23/2015 1630   PHURINE 6.0 08/23/2015 1630   GLUCOSEU NEGATIVE 08/23/2015 1630   HGBUR MODERATE (A) 08/23/2015 1630   BILIRUBINUR NEGATIVE 08/23/2015 1630   KETONESUR NEGATIVE 08/23/2015 1630   PROTEINUR 100 (A) 08/23/2015 1630   UROBILINOGEN 2.0 (H) 07/08/2011 0051   NITRITE NEGATIVE 08/23/2015 1630   LEUKOCYTESUR LARGE (A) 08/23/2015 1630   Sepsis Labs: '@LABRCNTIP' (procalcitonin:4,lacticidven:4)  ) Recent Results (from the past 240 hour(s))  Culture, blood (routine x 2)     Status: None   Collection Time: 08/23/15  8:58 PM  Result Value Ref Range Status   Specimen Description BLOOD RIGHT HAND  Final   Special Requests BOTTLES DRAWN AEROBIC ONLY 5CC  Final   Culture NO GROWTH 5 DAYS  Final   Report Status 08/28/2015 FINAL  Final  Culture, blood (routine x 2)     Status: None   Collection Time: 08/23/15  9:04 PM  Result Value Ref Range Status   Specimen Description BLOOD RIGHT WRIST  Final   Special Requests IN PEDIATRIC BOTTLE 3CC  Final   Culture NO GROWTH 5 DAYS  Final   Report Status 08/28/2015 FINAL  Final  MRSA PCR Screening     Status: None   Collection Time: 08/24/15 12:58 AM  Result Value Ref Range Status   MRSA by PCR NEGATIVE NEGATIVE Final    Comment:        The GeneXpert MRSA Assay (FDA approved for NASAL specimens only), is one component of a comprehensive MRSA  colonization surveillance program. It is not intended to diagnose MRSA infection nor to guide or monitor treatment for MRSA infections.          Radiology Studies: No results found.      Scheduled Meds: . calcium carbonate  2 tablet Oral TID  . ferric gluconate (FERRLECIT/NULECIT) IV  125 mg Intravenous QODAY  . fluconazole (DIFLUCAN) IV  50 mg Intravenous Q24H  . insulin aspart  0-20 Units Subcutaneous TID WC  . insulin aspart  0-5 Units Subcutaneous QHS  . mouth rinse  15 mL Mouth Rinse BID   Continuous Infusions: . sodium chloride 50 mL/hr at 09/02/15 1621     LOS: 10 days    Time spent: 40 minutes    WOODS, Geraldo Docker, MD [facility] Pager [phone]   If 7PM-7AM, please contact night-coverage [email or web address] Password [email or web address] 09/02/2015, 5:00 PM

## 2015-09-02 NOTE — Progress Notes (Signed)
S: No acute events.  Continues to have impressive output  O:BP 124/68 (BP Location: Right Arm)   Pulse 90   Temp 99.5 F (37.5 C) (Oral)   Resp (!) 24   Ht _0  (1.702 m)   Wt 107 kg (235 lb 14.3 oz)   LMP  (LMP Unknown)   SpO2 96%   BMI 36.95 kg/m   Intake/Output Summary (Last 24 hours) at 09/02/15 1032 Last data filed at 09/02/15 0831  Gross per 24 hour  Intake          1093.33 ml  Output             6450 ml  Net         -5356.67 ml   Weight change:  Gen: laying in bed, NAD CVS:RRR Resp:Clear Abd:+ BS NTND Ext:Rt BKA.  No edema.  Mult chronic lesions over arms/legs/torso NEURO: Awake and alert, Ox3  No asterixis GU clear pale yellow urine in Foley   . calcium carbonate  2 tablet Oral TID  . fluconazole (DIFLUCAN) IV  50 mg Intravenous Q24H  . insulin aspart  0-20 Units Subcutaneous TID WC  . insulin aspart  0-5 Units Subcutaneous QHS  . mouth rinse  15 mL Mouth Rinse BID   US Renal  Result Date: 08/31/2015 CLINICAL DATA:  Acute renal failure. EXAM: RENAL / URINARY TRACT ULTRASOUND COMPLETE COMPARISON:  CT of the abdomen pelvis 08/29/2015 FINDINGS: Right Kidney: Length: 12.8 cm.  There is moderate hydronephrosis. Left Kidney: Length: 12.2 cm.  There is moderate hydronephrosis. Bladder: Urinary bladder is poorly visualized as it is decompressed around urinary Foley catheter. IMPRESSION: Bilateral moderate hydronephrosis without evidence of cortical thinning. Electronically Signed   By: Fidela Salisbury M.D.   On: 08/31/2015 14:49   BMET    Component Value Date/Time   NA 140 09/02/2015 0427   K 4.6 09/02/2015 0427   CL 108 09/02/2015 0427   CO2 25 09/02/2015 0427   GLUCOSE 99 09/02/2015 0427   BUN 30 (H) 09/02/2015 0427   CREATININE 2.35 (H) 09/02/2015 0427   CALCIUM 7.9 (L) 09/02/2015 0427   GFRNONAA 23 (L) 09/02/2015 0427   GFRAA 27 (L) 09/02/2015 0427   CBC    Component Value Date/Time   WBC 6.6 09/02/2015 0427   RBC 2.57 (L) 09/02/2015 0427   HGB 7.1  (L) 09/02/2015 0427   HCT 23.1 (L) 09/02/2015 0427   HCT 20.8 (L) 08/26/2015 1436   PLT 91 (L) 09/02/2015 0427   MCV 89.9 09/02/2015 0427   MCH 27.6 09/02/2015 0427   MCHC 30.7 09/02/2015 0427   RDW 17.0 (H) 09/02/2015 0427   LYMPHSABS 1.1 09/02/2015 0427   MONOABS 0.5 09/02/2015 0427   EOSABS 0.2 09/02/2015 0427   BASOSABS 0.0 09/02/2015 0427     Assessment/ Plan:  1. ARF in setting of Bil hydronephrosis,  probable BOO as UO excellent after foley placement and Scr cont to decrease.  Repeat renal US showing persistence of bilateral hydro- would have expected this to improve more but pt has robust urine output with downtrending creatinine.  Urology consulted.  She will likely need Foley in for at least two weeks. 2. Anemia/thrombocytopenia of unclear etiology.  BM biopsy has been unrevealing. Iron % sat is 15 with worsening anemia.  Will replete iron stores with Ferric gluconate, will also give Aranesp as well. 3. DM- likely causing a neurogenic bladder 4.  Chronic diabetic skin lesions, awaiting biopsy results. 5. Met acidosis, improved 6.  Hypocalcemia on oral replacement 7. Hypomag 8. Hypokalemia, improved  Madelon Lips  [facility] pgr [phone] cell: [phone]

## 2015-09-02 NOTE — Progress Notes (Signed)
Rehab admissions - I met with patient at her bedside.  She lives with her two children ages 57 and 41.  Her mother cannot assist her but mom is helping the children.  Patient has no other family or supports.  Her goal is to go back home with her children.  I would like to see another therapy session and I need to discuss with rehab MD.  I will follow up again tomorrow.  Call me for questions.  #[phone]

## 2015-09-02 NOTE — Consult Note (Signed)
I have been asked to see the patient by Dr. Carolyne Littlesurtis Woods, for evaluation and management of bilateral hydronephrosis.  History of present illness: [age] unwell female is seen today for evaluation of bilateral hydronephrosis.  She was initally found to be in acute renal failure, urinary retention with an over distended bladder and bilateral hydronephrosis.  Since then had a foley catheter placed and has made a tremendous amount of urine and is 22L negative.  Despite her brisk diuresis she has persistence of her hydronephrosis bilaterally as well as renal insufficiency.    The patient also complains of urinary incontinence. Her incontinence has been progressive over the past year. She feels as if she does not empty her bladder completely. Initially, the patient had urinary frequency, but this overtime improved. She does have severe incontinence, wearing 3 or 4 pads per day. She leaks at night as well. Over the last month or so she was unable to get up from her bed and make it to the bathroom in time and would thus her incontinence worsened. Over time, she lost her urge to void. Ultimately, she was not voiding much at all into the toilet, but instead she was leaking into her pads. She denies having any flank pain in particular, she does have chronic low back pain. The patient does not have a history of recurrent urinary tract infections. She is not having history of kidney stones or any other GU abnormalities. She maintains her uterus and denies symptoms of pelvic organ prolapse. She also denies a history of constipation. Finally, as the patient got weaker over the past month she also noted that she felt very puffy, full, and "fat".  Prior to her presentation she was negligent in her own care and hadn't take any of her diabetes medications over the last year.  She has noted numbness in her extremities and progressive weakness.  Her last Creatinine prior to this admission was in 2013 which was 1.0.  She has a PMH of  DMII, stroke, and PVD.   Review of systems: A 12 point comprehensive review of systems was obtained and is negative unless otherwise stated in the history of present illness.  Patient Active Problem List   Diagnosis Date Noted  . AKI (acute kidney injury) (HCC)   . DM type 2 with diabetic peripheral neuropathy (HCC)   . S/P BKA (below knee amputation) unilateral (HCC)   . Medical non-compliance   . Benign skin lesion of multiple sites   . Tachypnea   . Acute blood loss anemia   . Neurogenic bladder   . Occult blood positive stool   . Rectovaginal fistula   . Controlled diabetes mellitus type 2 with complications (HCC)   . Acute on chronic renal failure (HCC)   . Hydronephrosis determined by ultrasound   . Papular rash   . Uncontrolled type 2 diabetes mellitus with complication (HCC)   . Paresthesia   . Thrombocytopenia (HCC) 08/23/2015  . Pressure ulcer 08/23/2015  . Severe anemia 08/23/2015  . UTI (lower urinary tract infection) 08/23/2015  . Absolute anemia 08/23/2015  . Peripheral vascular disease, unspecified (HCC) 04/27/2012  . Unilateral complete BKA (HCC) 07/09/2011  . Gas gangrene (HCC) 07/02/2011  . Sepsis(995.91) 07/02/2011  . Acute renal failure (HCC) 07/02/2011  . Diabetes mellitus (HCC) 11/22/2010  . Diabetic foot ulcer associated with type 2 diabetes mellitus (HCC)   . Hypertension   . Arthritis   . Neuropathy (HCC)   . Intracerebral hemorrhage (HCC)  No current facility-administered medications on file prior to encounter.    Current Outpatient Prescriptions on File Prior to Encounter  Medication Sig Dispense Refill  . amLODipine (NORVASC) 10 MG tablet Take 1 tablet (10 mg total) by mouth daily. 30 tablet 0  . insulin aspart (NOVOLOG) 100 UNIT/ML injection Inject 3 Units into the skin 3 (three) times daily with meals. 1 vial 0    Past Medical History:  Diagnosis Date  . Arthritis   . Diabetes mellitus   . Diabetic foot ulcer associated with type 2  diabetes mellitus (HCC)   . Hypertension   . Intracerebral hemorrhage (HCC) As a teenager    States she had burst blood vessel as teenager, now with resultant minor visual field and hearing deficits  . Neuropathy (HCC)   . Stroke [facility])     Past Surgical History:  Procedure Laterality Date  . AMPUTATION  11/24/2010   Procedure: AMPUTATION FOOT;  Surgeon: [name], MD;  Location[facility] OR;  Service: Orthopedics;  Laterality: Right;  Right  FOOT TRANS METATARSAL AMPUTATION  . AMPUTATION  07/02/2011   Procedure: AMPUTATION BELOW KNEE;  Surgeon: [name], MD;  Location: [facility] OR;  Service: Orthopedics;  Laterality: Right;  . APPLICATION OF WOUND VAC  07/02/2011   Procedure: APPLICATION OF WOUND VAC;  Surgeon: [name], MD;  Location: [facility] OR;  Service: Orthopedics;  Laterality: Right;  . BRAIN SURGERY  1980's   Previous brain surgery in [place], [place]  . CESAREAN SECTION    . I&D EXTREMITY  11/24/2010   Procedure: IRRIGATION AND DEBRIDEMENT EXTREMITY;  Surgeon: [name], MD;  Location: [facility] OR;  Service: Orthopedics;  Laterality: Left;  Debriedment of left plantar ulcer and trimming of toenails  . I&D EXTREMITY  07/02/2011   Procedure: IRRIGATION AND DEBRIDEMENT EXTREMITY;  Surgeon: [name], MD;  Location: [facility] OR;  Service: Orthopedics;  Laterality: Left;  I & D of Left Foot  . I&D EXTREMITY  07/06/2011   Procedure: IRRIGATION AND DEBRIDEMENT EXTREMITY;  Surgeon: [name], MD;  Location: [facility] OR;  Service: Orthopedics;  Laterality: Right;  IRRIGATION/DEBRIDEMENT RIGHT BELOW KNEE AMPUTATION    Social History  Substance Use Topics  . Smoking status: Never Smoker  . Smokeless tobacco: Never Used  . Alcohol use No    Family History  Problem Relation Age of Onset  . Diabetes Mother   . Hypertension Mother   . Hyperlipidemia Mother   . Diabetes Father   . Cancer Father     prostate  . Hyperlipidemia Father   . Hypertension Father   . Diabetes Brother   . Peripheral Artery Disease  Brother     has had toes amputated    PE: Vitals:   09/02/15 1447 09/02/15 1957 09/02/15 2305 09/03/15 0416  BP: 132/70 (!) 134/92 (!) 152/81 (!) 161/81  Pulse: 99 98 98 93  Resp: (!) 27 (!) 34 19 18  Temp: 98 F (36.7 C) 98.3 F (36.8 C) 97.3 F (36.3 C) 98.3 F (36.8 C)  TempSrc: Oral Oral Oral Oral  SpO2: 98% 100% 100% 100%  Weight:      Height:       Patient appears to be in no acute distress  patient is alert and oriented x3 Atraumatic normocephalic head No cervical or supraclavicular lymphadenopathy appreciated No increased work of breathing, no audible wheezes/rhonchi Regular sinus rhythm/rate Abdomen is soft, nontender, nondistended, no CVA or suprapubic tenderness Right BKA, no left pedal edema Grossly neurologically intact No identifiable  skin lesions    Recent Labs  09/01/15 0416 09/02/15 0427  WBC 6.6 6.6  HGB 7.6* 7.1*  HCT 24.6* 23.1*    Recent Labs  09/01/15 0416 09/02/15 0427 09/03/15 0358  NA 139 140 143  K 4.5 4.6 4.6  CL 106 108 110  CO2 27 25 24   GLUCOSE 104* 99 93  BUN 31* 30* 25*  CREATININE 2.56* 2.35* 2.24*  CALCIUM 7.4* 7.9* 8.5*   No results for input(s): LABPT, INR in the last 72 hours. No results for input(s): LABURIN in the last 72 hours. Results for orders placed or performed during the hospital encounter of 08/23/15  Urine culture     Status: Abnormal   Collection Time: 08/23/15  4:30 PM  Result Value Ref Range Status   Specimen Description URINE, RANDOM  Final   Special Requests NONE  Final   Culture MULTIPLE SPECIES PRESENT, SUGGEST RECOLLECTION (A)  Final   Report Status 08/24/2015 FINAL  Final  Culture, blood (routine x 2)     Status: None   Collection Time: 08/23/15  8:58 PM  Result Value Ref Range Status   Specimen Description BLOOD RIGHT HAND  Final   Special Requests BOTTLES DRAWN AEROBIC ONLY 5CC  Final   Culture NO GROWTH 5 DAYS  Final   Report Status 08/28/2015 FINAL  Final  Culture, blood (routine x 2)      Status: None   Collection Time: 08/23/15  9:04 PM  Result Value Ref Range Status   Specimen Description BLOOD RIGHT WRIST  Final   Special Requests IN PEDIATRIC BOTTLE 3CC  Final   Culture NO GROWTH 5 DAYS  Final   Report Status 08/28/2015 FINAL  Final  MRSA PCR Screening     Status: None   Collection Time: 08/24/15 12:58 AM  Result Value Ref Range Status   MRSA by PCR NEGATIVE NEGATIVE Final    Comment:        The GeneXpert MRSA Assay (FDA approved for NASAL specimens only), is one component of a comprehensive MRSA colonization surveillance program. It is not intended to diagnose MRSA infection nor to guide or monitor treatment for MRSA infections.     Imaging: I have reviewed her CT san and her Ultrasound, reviewed the report and discussed the findings with the patient.  Imp/Recommendations: ARF on CRF secondary to severe diabetic cystopathy resulting in decreased sensation, overflow incontinence and urinary retention.  Her urinary retention is chronic, and has led to bilateral ureteral reflux. Since her Foley catheterized and placed, her creatinine has continued to improve and, her hydro is better, although she has some persistent hydro.  The patient is making a tremendous amount of urine and diuresing all the excess fluid that she has retained over the past several months. I don't think that she is obstructed, I don't think placing bilateral ureteral stents will help improve her renal function. I do expect that her hydronephrosis will improve over the course of the next several weeks, and once she has stopped diuresing and is euvolemic I would repeat  the renal ultrasound.  If her hydro persists then we could consider obtaining a lasix renogram to evaluate for obstructing prior to placing stents. I think the patient's urinary retention is likely to be an ongoing issue as well. She may well have to learn clean intermittent catheterization or require an  indwelling Foley. However,  again once she is euvolemic and no longer diuresing and is strong enough and mobile enough to use  a toilet I would recommend a voiding trial. Learning to cath may be an issue because of the dexterity that she has lost her hands from her poorly controlled diabetes.     Thank you for involving me in this patient's care, I will continue to follow along.  Berniece Salines W

## 2015-09-03 DIAGNOSIS — D689 Coagulation defect, unspecified: Secondary | ICD-10-CM

## 2015-09-03 DIAGNOSIS — R339 Retention of urine, unspecified: Secondary | ICD-10-CM

## 2015-09-03 LAB — GLUCOSE, CAPILLARY
Glucose-Capillary: 99 mg/dL (ref 65–99)
Glucose-Capillary: 118 mg/dL — ABNORMAL HIGH (ref 65–99)
Glucose-Capillary: 106 mg/dL — ABNORMAL HIGH (ref 65–99)
Glucose-Capillary: 122 mg/dL — ABNORMAL HIGH (ref 65–99)

## 2015-09-03 LAB — RENAL FUNCTION PANEL
Albumin: 2.5 g/dL — ABNORMAL LOW (ref 3.5–5.0)
Anion gap: 9 (ref 5–15)
BUN: 25 mg/dL — ABNORMAL HIGH (ref 6–20)
CO2: 24 mmol/L (ref 22–32)
Calcium: 8.5 mg/dL — ABNORMAL LOW (ref 8.9–10.3)
Chloride: 110 mmol/L (ref 101–111)
Creatinine, Ser: 2.24 mg/dL — ABNORMAL HIGH (ref 0.44–1.00)
GFR calc Af Amer: 29 mL/min — ABNORMAL LOW (ref >60)
GFR calc non Af Amer: 25 mL/min — ABNORMAL LOW (ref >60)
Glucose, Bld: 93 mg/dL (ref 65–99)
Phosphorus: 3.5 mg/dL (ref 2.5–4.6)
Potassium: 4.6 mmol/L (ref 3.5–5.1)
Sodium: 143 mmol/L (ref 135–145)

## 2015-09-03 LAB — LACTATE DEHYDROGENASE: LDH: 168 U/L (ref 98–192)

## 2015-09-03 MED ORDER — PROCHLORPERAZINE EDISYLATE 5 MG/ML IJ SOLN
5.0000 mg | Freq: Once | INTRAMUSCULAR | Status: AC
  Administered 2015-09-03: 5 mg via INTRAVENOUS
  Filled 2015-09-03: qty 2

## 2015-09-03 NOTE — Progress Notes (Signed)
[place] TEAM 1 - Stepdown/ICU TEAM  ALLICE GARRO  ACZ:[id number] DOB: Dec 17, 1965 DOA: 08/23/2015 PCP: [name], MD    Brief Narrative:  50 y.o.F Hx Intracerebral hemorrhage, Stroke, DM2, S/P right BKA, and HTN who presented to the ED for weakness progressive over 4 weeks she's been becoming increasingly weak.  She had not seen her physician in a year. She had also been off her medications for 1 year.  Subjective: The patient is resting comfortably with no new complaints.  She denies chest pain shortness breath fevers chills nausea vomiting or abdominal pain.  She is tolerating her diet without difficulty.  Assessment & Plan:  Severe Anemia w/o evidence of acute blood loss  -Transfuse as needed to keep hemoglobin 7.0 or greater -Hematology is following with [place] - bone marrow appears healthy/without abnormality -IV iron and erythropoietin per nephrology -Etiology remains unclear apart from chronic kidney disease  Thrombocytopenia -Hematology following -Bone Marrow Biopsy without abnormality -Transfuse as needed for platelets less than 10K or active bleeding  Acute on CKD -Cr 1.04 in 2013  -[place] noted mod B hydronephrosis in the setting of acute urinary retention - Foley catheter has since been placed and creatinine continues to slowly improve -Felt to be due to ineffective B00 in setting of diabetic bladder neuropathy -follow-up imaging noted some persisting hydronephrosis despite Foley drained bladder - Urology has seen - Foley to remain -Repeat renal ultrasound when diuretic phase resolved  Recent Labs Lab 08/30/15 0416 08/31/15 0424 09/01/15 0416 09/02/15 0427 09/03/15 0358  CREATININE 3.28* 2.69* 2.56* 2.35* 2.24*    UTI  -has completed a course of levaquin - urine culture not helpful   Hypokalemia  -replaced  Hypomagnesemia -replaced  Hypocalcemia  -Now corrects into normal range  Papular Rash - Lichen simplex chronicus / prurigo  nodularis -reportedly chronic, or at least subacute -Skin biopsy suggests above diagnosis, w/o evidence of vascular disease, calcphylaxis, or infection  -stop diflucan  HTN -Blood pressure trending upward - continue to follow without change in medical therapy today but may soon require titration of medication  DM2 uncontrolled  -S/P right BKA -A1c 5.3 - CBG controlled  Weakness / Numbness / Tingling- No focal deficit -Most likely secondary to chronically severely uncontrolled diabetes v/s profound anemia v/s hypocalcemia - much improved at this time   DVT prophylaxis: SCDs Code Status: FULL CODE Family Communication: no family present at time of exam  Disposition Plan: PT/OT - transfer to renal floor - eventual d/c to SNF for rehab stay   Consultants:  Hematology  Nephrology Urology    Procedures: none  Antimicrobials:  Ceftriaxone 8/19 Levaquin 8/19 > 8/28 Diflucan 8/19 > 8/29  Objective: Blood pressure (!) 148/70, pulse 92, temperature 98.8 F (37.1 C), temperature source Oral, resp. rate 15, height _0  (1.702 m), weight 105.1 kg (231 lb 11.3 oz), SpO2 100 %.  Intake/Output Summary (Last 24 hours) at 09/03/15 1624 Last data filed at 09/03/15 1455  Gross per 24 hour  Intake             2110 ml  Output             6625 ml  Net            -4515 ml   Filed Weights   08/31/15 0335 09/02/15 0500 09/03/15 0500  Weight: 107.7 kg (237 lb 7 oz) 107 kg (235 lb 14.3 oz) 105.1 kg (231 lb 11.3 oz)    Examination: General: No acute respiratory distress -  alert  Lungs: Clear to auscultation th/o w/ no wheezes or crackles Cardiovascular: Regular rate and rhythm without murmur  Abdomen: Nontender, nondistended, soft, bowel sounds positive, no rebound, no ascites, no appreciable mass Extremities: No significant cyanosis, clubbing, or edema bilateral lower extremities  CBC:  Recent Labs Lab 08/28/15 0255 08/29/15 0423 08/30/15 0845 08/31/15 0424 09/01/15 0416  09/02/15 0427  WBC 9.8 9.5 6.7 6.0 6.6 6.6  NEUTROABS 6.8 7.1  --  4.1 4.7 4.7  HGB 7.2* 6.2* 6.8* 6.4* 7.6* 7.1*  HCT 22.6* 19.8* 21.9* 20.7* 24.6* 23.1*  MCV 88.6 88.8 90.1 90.4 90.1 89.9  PLT 55* 90* 107* 98* 94* 91*   Basic Metabolic Panel:  Recent Labs Lab 08/29/15 0423 08/30/15 0416 08/31/15 0424 09/01/15 0416 09/02/15 0427 09/03/15 0358  NA 140 140 140 139 140 143  K 3.8 4.0 4.0 4.5 4.6 4.6  CL 102 104 106 106 108 110  CO2 _0 GLUCOSE 81 142* 92 104* 99 93  BUN 40* 37* 33* 31* 30* 25*  CREATININE 3.42* 3.28* 2.69* 2.56* 2.35* 2.24*  CALCIUM 6.3* 6.6* 7.1* 7.4* 7.9* 8.5*  MG 1.4* 1.5* 1.5* 1.5* 2.1  --   PHOS 3.6 3.4 3.6 3.8 3.6 3.5   GFR: Estimated Creatinine Clearance: 38.3 mL/min (by C-G formula based on SCr of 2.24 mg/dL).  Liver Function Tests:  Recent Labs Lab 08/28/15 0255 08/29/15 0423 08/30/15 0416 09/01/15 0416 09/02/15 0427 09/03/15 0358  AST 14*  --   --   --   --   --   ALT 9*  --   --   --   --   --   ALKPHOS 59  --   --   --   --   --   BILITOT 0.6  --   --   --   --   --   PROT 5.8*  --   --   --   --   --   ALBUMIN 2.4* 2.3* 2.4* 2.5* 2.4* 2.5*    Coagulation Profile:  Recent Labs Lab 08/30/15 0416  INR 1.39   HbA1C: Hgb A1c MFr Bld  Date/Time Value Ref Range Status  08/27/2015 05:34 AM 5.3 4.8 - 5.6 % Final    Comment:    (NOTE)         Pre-diabetes: 5.7 - 6.4         Diabetes: >6.4         Glycemic control for adults with diabetes: <7.0   11/23/2010 12:51 AM 12.4 (H) <5.7 % Final    Comment:    (NOTE)                                                                       According to the ADA Clinical Practice Recommendations for 2011, when HbA1c is used as a screening test:  >=6.5%   Diagnostic of Diabetes Mellitus           (if abnormal result is confirmed) 5.7-6.4%   Increased risk of developing Diabetes Mellitus References:Diagnosis and Classification of Diabetes  [place],Diabetes DDUK,0254,27(CWCBJ 1):S62-S69 and Standards of Medical Care in         Diabetes - 2011,Diabetes Care,2011,34 (Suppl 1):S11-S61.  CBG:  Recent Labs Lab 09/02/15 1339 09/02/15 1619 09/02/15 2114 09/03/15 0852 09/03/15 1231  GLUCAP 140* 141* 147* 99 118*    Scheduled Meds: . calcium carbonate  2 tablet Oral TID  . ferric gluconate (FERRLECIT/NULECIT) IV  125 mg Intravenous QODAY  . fluconazole (DIFLUCAN) IV  50 mg Intravenous Q24H  . insulin aspart  0-20 Units Subcutaneous TID WC  . insulin aspart  0-5 Units Subcutaneous QHS  . mouth rinse  15 mL Mouth Rinse BID     LOS: 11 days   Cherene Altes, MD [facility] Office  [phone] Pager - Text Page per Amion as per below:  On-Call/Text Page:      [email or web address]      password [email or web address]  If 7PM-7AM, please contact night-coverage [email or web address] Password [email or web address] 09/03/2015, 4:24 PM

## 2015-09-03 NOTE — Progress Notes (Signed)
Pharmacy Antibiotic Note  [name] is a 50 y.o. female admitted on 08/23/2015 with papular rash/dark plaque lesions covering entire body (Diabetic lesion vs autoimmune lesion vs malignancy vs calciphylaxis with fungal infection).   D#12 of empiric fluconazole for poss. candidal lesions. Biopsy of lesions pending. Remains afeb, WBC WNL. All cultures neg to date. SCr improving though still with persistent hydronephrosis and renal insufficiency.   Plan: -Continue Fluconazole 50 mg IV q24h however, consider trial off therapy -Trend WBC, temp, renal function  -F/U infectious/oncologic work-up   Temp (24hrs), Avg:98 F (36.7 C), Min:97.3 F (36.3 C), Max:98.4 F (36.9 C)   Recent Labs Lab 08/29/15 0423 08/30/15 0416 08/30/15 0845 08/31/15 0424 09/01/15 0416 09/02/15 0427 09/03/15 0358  WBC 9.5  --  6.7 6.0 6.6 6.6  --   CREATININE 3.42* 3.28*  --  2.69* 2.56* 2.35* 2.24*    Estimated Creatinine Clearance: 38.3 mL/min (by C-G formula based on SCr of 2.24 mg/dL).    No Known Allergies  [name], PharmD Clinical Pharmacist 8:48 AM, 09/03/2015

## 2015-09-03 NOTE — Progress Notes (Addendum)
Physical Therapy Treatment Patient Details Name: Earvin HansenMichelle L Schrodt MRN: [id number] DOB: 02/10/1965 Today's Date: 09/03/2015    History of Present Illness Patient is a 50 yo female admitted 08/23/15 with general weakness, severe anemia (Hgb 4.2), UTI, AKI, skin lesions.    PMH:  DM, neuropathy, h/o Rt BKA, HTN, ARF, anxiety, ICH, CVA    PT Comments    Pt progressing well with standing tolerance, she stood with RW for 90 sec + 120 sec + 180 sec. Performed BUE and LLE strengthening exercises. Pt reports continued numbness L foot and B hands.  Pt hopes to return to baseline of independent WC transfers and hopping short distances with RW. She also stated long term goal of learning to walk with prosthesis, she stated she never had gait training with prosthesis following R BKA in 2013.   Follow Up Recommendations  CIR;Supervision for mobility/OOB     Equipment Recommendations  3in1 (PT)    Recommendations for Other Services Rehab consult;OT consult     Precautions / Restrictions Precautions Precautions: Fall Precaution Comments: R BKA 2013 Restrictions Weight Bearing Restrictions: No    Mobility  Bed Mobility               General bed mobility comments: up in recliner  Transfers Overall transfer level: Needs assistance Equipment used: Rolling walker (2 wheeled)   Sit to Stand: Min assist         General transfer comment: pt with L LE blocked and able to compelte sit<>Stand x 3 trials, stood with RW for 90 sec, 120 sec, 180 sec. In standing she performed minisquats x 5. Verbal cues for hand placement with sit to stand.   Ambulation/Gait                 Stairs            Wheelchair Mobility    Modified Rankin (Stroke Patients Only)       Balance     Sitting balance-Leahy Scale: Good       Standing balance-Leahy Scale: Poor                      Cognition Arousal/Alertness: Awake/alert Behavior During Therapy: Flat affect Overall  Cognitive Status: Impaired/Different from baseline Area of Impairment: Following commands       Following Commands: Follows multi-step commands inconsistently;Follows multi-step commands with increased time       General Comments: Pt needed cues during session when provided two step commands    Exercises General Exercises - Lower Extremity Mini-Sqauts: AROM;Standing;5 reps Other Exercises Other Exercises: armchair pushups x 10 in recliner  Bilateral Shoulder flexion AROM x 10 seated    General Comments General comments (skin integrity, edema, etc.): skin lesions all over body, tunneling wound R buttock, RN performed dressing change while pt was standing      Pertinent Vitals/Pain Pain Assessment: No/denies pain    Home Living                      Prior Function            PT Goals (current goals can now be found in the care plan section) Acute Rehab PT Goals Patient Stated Goal: To get my strength back, be able to get in/out of car, [facility], hop short distances with RW like she did at baseline; stated she was never taught how to walk with prosthesis and would like to do that PT  Goal Formulation: With patient Time For Goal Achievement: 09/13/15 Potential to Achieve Goals: Good Additional Goals Additional Goal #1: Patient will propel w/c 100' independently Progress towards PT goals: Progressing toward goals    Frequency  Min 3X/week    PT Plan Current plan remains appropriate    Co-evaluation             End of Session Equipment Utilized During Treatment: Gait belt Activity Tolerance: Patient tolerated treatment well Patient left: with call bell/phone within reach;in chair;with chair alarm set     Time: 9562-1308 PT Time Calculation (min) (ACUTE ONLY): 31 min  Charges:  $Therapeutic Exercise: 8-22 mins $Therapeutic Activity: 8-22 mins                    G Codes:      Tamala Ser 09/03/2015, 11:15 AM [phone]

## 2015-09-03 NOTE — Progress Notes (Addendum)
S: No acute events.  Continues to have impressive urine output.  Creatinine slowly drifting downward.    O:BP (!) 144/77 (BP Location: Right Arm)   Pulse 93   Temp 97.8 F (36.6 C) (Oral)   Resp 20   Ht '5\' 7"'  (1.702 m)   Wt 105.1 kg (231 lb 11.3 oz)   LMP  (LMP Unknown)   SpO2 100%   BMI 36.29 kg/m   Intake/Output Summary (Last 24 hours) at 09/03/15 1206 Last data filed at 09/03/15 1127  Gross per 24 hour  Intake             2300 ml  Output             7025 ml  Net            -4725 ml   Weight change: -1.9 kg (-4 lb 3 oz) Gen: laying in bed, NAD CVS:RRR Resp:Clear Abd:+ BS NTND Ext:Rt BKA.  No edema.  Mult chronic lesions over arms/legs/torso NEURO: Awake and alert, Ox3  No asterixis GU clear pale yellow urine in Foley   . calcium carbonate  2 tablet Oral TID  . ferric gluconate (FERRLECIT/NULECIT) IV  125 mg Intravenous QODAY  . fluconazole (DIFLUCAN) IV  50 mg Intravenous Q24H  . insulin aspart  0-20 Units Subcutaneous TID WC  . insulin aspart  0-5 Units Subcutaneous QHS  . mouth rinse  15 mL Mouth Rinse BID   No results found. BMET    Component Value Date/Time   NA 143 09/03/2015 0358   K 4.6 09/03/2015 0358   CL 110 09/03/2015 0358   CO2 24 09/03/2015 0358   GLUCOSE 93 09/03/2015 0358   BUN 25 (H) 09/03/2015 0358   CREATININE 2.24 (H) 09/03/2015 0358   CALCIUM 8.5 (L) 09/03/2015 0358   GFRNONAA 25 (L) 09/03/2015 0358   GFRAA 29 (L) 09/03/2015 0358   CBC    Component Value Date/Time   WBC 6.6 09/02/2015 0427   RBC 2.57 (L) 09/02/2015 0427   HGB 7.1 (L) 09/02/2015 0427   HCT 23.1 (L) 09/02/2015 0427   HCT 20.8 (L) 08/26/2015 1436   PLT 91 (L) 09/02/2015 0427   MCV 89.9 09/02/2015 0427   MCH 27.6 09/02/2015 0427   MCHC 30.7 09/02/2015 0427   RDW 17.0 (H) 09/02/2015 0427   LYMPHSABS 1.1 09/02/2015 0427   MONOABS 0.5 09/02/2015 0427   EOSABS 0.2 09/02/2015 0427   BASOSABS 0.0 09/02/2015 0427     Assessment/ Plan:  1. ARF in setting of Bil  hydronephrosis,  BOO as UO excellent after foley placement and Scr cont to decrease.  Repeat renal US showing persistence of bilateral hydro- would have expected this to improve more but pt has robust urine output with downtrending creatinine.  Urology consulted as well, appreciate recs.  Will need repeat imaging after post-obstructive diuresis (in a few weeks).  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  2. Anemia/thrombocytopenia of unclear etiology.  BM biopsy has been unrevealing. Iron % sat is 15 with worsening anemia.  Will replete iron stores with Ferric gluconate IV (started 8/29), Aranesp 60 mcg given  as well (8/29)  3. DM : likely causing a neurogenic bladder, has neuropathy as well.  4.  Chronic diabetic skin lesions, awaiting biopsy results 8/27. 5. Met acidosis, resolved 6. Hypocalcemia on oral replacement 7. Hypomag 8. Hypokalemia, resolved  Madelon Lips  [facility] pgr [phone] cell: [phone]

## 2015-09-03 NOTE — Progress Notes (Signed)
I met with pt at bedside to further discuss caregiver support after d/c. She lives at home with her 65 and 49 year old children. Her Mom is staying with her children at night, but visiting with her spouse at [facility] where he is a patient during the day. Mom typically does not provide physical care for pt if needed. Unless pt has physical caregivers at home for eventual d/c, she will need SNF rehab. I did discuss this recommendation with pt. I will update RN CM and SW. [phone]

## 2015-09-03 NOTE — Clinical Social Work Note (Signed)
CSW met with patient. Discussed that [facility] cannot take patient due to no physical caregivers in her home. Patient upset about this. Discussed alternative SNF option. Patient does not want to go to a nursing home. Explained that [facility] has been approved due to necessity and lack of insurance. Patient stated that she is going to talk with her mother to see if she provide more physical caregiving.   CSW provided SNF list and told patient to let CSW know if she changed her mind. RNCM notified.  Dayton Scrape, [place]

## 2015-09-03 NOTE — Progress Notes (Signed)
IP PROGRESS NOTE  Subjective: She continues to complain of numbness in the fingers.  Objective: Vital signs in last 24 hours: Blood pressure (!) 148/70, pulse 92, temperature 98.8 F (37.1 C), temperature source Oral, resp. rate 15, height 5' 7" (1.702 m), weight 231 lb 11.3 oz (105.1 kg), SpO2 100 %.  Intake/Output from previous day: 08/29 0701 - 08/30 0700 In: 2340 [P.O.:1080; I.V.:1150; IV Piggyback:110] Out: 6501 [Urine:6500; Stool:1]  Physical Exam:  HEENT-no thrush or bleeding Skin-numerous hyperpigmented crusted lesions over the extremities, ulcerated necrotic lesion at the upper outer left breast-appears improved Resolving yeast rash beneath the breasts Abdomen: No hepatosplenomegaly Neurologic: Good left foot strength, grips hand bilaterally   Lab Results:  Recent Labs  09/01/15 0416 09/02/15 0427  WBC 6.6 6.6  HGB 7.6* 7.1*  HCT 24.6* 23.1*  PLT 94* 91*    BMET  Recent Labs  09/02/15 0427 09/03/15 0358  NA 140 143  K 4.6 4.6  CL 108 110  CO2 25 24  GLUCOSE 99 93  BUN 30* 25*  CREATININE 2.35* 2.24*  CALCIUM 7.9* 8.5*   Erythropoietin level on 08/31/2015-8.1 08/30/2015-PT 17.2, PTT 38 Studies/Results: No results found.  Medications: I have reviewed the patient's current medications.  Assessment/Plan:  1. Severe anemia and thrombocytopenia-improved following multiple transfusions  Bone marrow biopsy 08/25/2015-no evidence of a hematopoietic malignancy, granulomatous disease, or metastatic carcinoma. Hypercellular marrow 2. Renal failure/bilateral hydronephrosis, urinary retention-creatinine improved , good urine output  Etiology of urinary retention not established, potentially related to neuropathy 3. Pustular/ulcerated skin lesions/decubitus ulcers-Biopsy from the left breast and left thigh revealed epidermal hyperplasia with fibrosing granulation tissue 4. Yeast rash beneath the breasts and in the groin-improved 5. Diabetes 6. Status post  right below-knee amputation in 2012 7. History of "neuropathy " 8. Coagulopathy-persistent following vitamin K  She appears stable. The hemoglobin remains low, in part secondary to phlebotomy and renal failure. I agree with the erythropoietin/iron. I continue to feel the hematologic findings are likely in part secondary to a systemic inflammatory condition, potentially infection.  Recommendations: 1. Erythropoietin/iron 2. Limit blood draws as possible 3. Check PT and PTT mixing studies     LOS: 11 days   SHERRILL, GARY, MD   09/03/2015, 4:33 PM  

## 2015-09-04 ENCOUNTER — Encounter (HOSPITAL_COMMUNITY): Payer: Self-pay | Admitting: *Deleted

## 2015-09-04 ENCOUNTER — Inpatient Hospital Stay (HOSPITAL_COMMUNITY)
Admission: RE | Admit: 2015-09-04 | Discharge: 2015-09-18 | DRG: 948 | Disposition: A | Discharge status: Home Health | Payer: Medicaid Other | Source: Intra-hospital | Attending: Physical Medicine & Rehabilitation | Admitting: Physical Medicine & Rehabilitation

## 2015-09-04 DIAGNOSIS — R791 Abnormal coagulation profile: Secondary | ICD-10-CM | POA: Diagnosis present

## 2015-09-04 DIAGNOSIS — E1143 Type 2 diabetes mellitus with diabetic autonomic (poly)neuropathy: Secondary | ICD-10-CM | POA: Diagnosis present

## 2015-09-04 DIAGNOSIS — N179 Acute kidney failure, unspecified: Secondary | ICD-10-CM | POA: Diagnosis present

## 2015-09-04 DIAGNOSIS — G8929 Other chronic pain: Secondary | ICD-10-CM | POA: Diagnosis present

## 2015-09-04 DIAGNOSIS — E0822 Diabetes mellitus due to underlying condition with diabetic chronic kidney disease: Secondary | ICD-10-CM

## 2015-09-04 DIAGNOSIS — N320 Bladder-neck obstruction: Secondary | ICD-10-CM | POA: Diagnosis present

## 2015-09-04 DIAGNOSIS — L738 Other specified follicular disorders: Secondary | ICD-10-CM | POA: Diagnosis present

## 2015-09-04 DIAGNOSIS — Z9114 Patient's other noncompliance with medication regimen: Secondary | ICD-10-CM

## 2015-09-04 DIAGNOSIS — N13729 Vesicoureteral-reflux with reflux nephropathy without hydroureter, unspecified: Secondary | ICD-10-CM | POA: Diagnosis present

## 2015-09-04 DIAGNOSIS — N183 Chronic kidney disease, stage 3 (moderate): Secondary | ICD-10-CM

## 2015-09-04 DIAGNOSIS — N189 Chronic kidney disease, unspecified: Secondary | ICD-10-CM | POA: Diagnosis present

## 2015-09-04 DIAGNOSIS — I129 Hypertensive chronic kidney disease with stage 1 through stage 4 chronic kidney disease, or unspecified chronic kidney disease: Secondary | ICD-10-CM | POA: Diagnosis present

## 2015-09-04 DIAGNOSIS — E11621 Type 2 diabetes mellitus with foot ulcer: Secondary | ICD-10-CM | POA: Diagnosis present

## 2015-09-04 DIAGNOSIS — D696 Thrombocytopenia, unspecified: Secondary | ICD-10-CM | POA: Diagnosis present

## 2015-09-04 DIAGNOSIS — R338 Other retention of urine: Secondary | ICD-10-CM | POA: Diagnosis present

## 2015-09-04 DIAGNOSIS — Z7982 Long term (current) use of aspirin: Secondary | ICD-10-CM

## 2015-09-04 DIAGNOSIS — R339 Retention of urine, unspecified: Secondary | ICD-10-CM

## 2015-09-04 DIAGNOSIS — K2970 Gastritis, unspecified, without bleeding: Secondary | ICD-10-CM | POA: Diagnosis present

## 2015-09-04 DIAGNOSIS — M549 Dorsalgia, unspecified: Secondary | ICD-10-CM | POA: Diagnosis present

## 2015-09-04 DIAGNOSIS — M792 Neuralgia and neuritis, unspecified: Secondary | ICD-10-CM

## 2015-09-04 DIAGNOSIS — N319 Neuromuscular dysfunction of bladder, unspecified: Secondary | ICD-10-CM | POA: Diagnosis present

## 2015-09-04 DIAGNOSIS — K644 Residual hemorrhoidal skin tags: Secondary | ICD-10-CM | POA: Diagnosis present

## 2015-09-04 DIAGNOSIS — E11649 Type 2 diabetes mellitus with hypoglycemia without coma: Secondary | ICD-10-CM | POA: Diagnosis present

## 2015-09-04 DIAGNOSIS — I951 Orthostatic hypotension: Secondary | ICD-10-CM | POA: Diagnosis not present

## 2015-09-04 DIAGNOSIS — K3184 Gastroparesis: Secondary | ICD-10-CM | POA: Diagnosis present

## 2015-09-04 DIAGNOSIS — E1122 Type 2 diabetes mellitus with diabetic chronic kidney disease: Secondary | ICD-10-CM | POA: Diagnosis present

## 2015-09-04 DIAGNOSIS — E1140 Type 2 diabetes mellitus with diabetic neuropathy, unspecified: Secondary | ICD-10-CM | POA: Diagnosis present

## 2015-09-04 DIAGNOSIS — D638 Anemia in other chronic diseases classified elsewhere: Secondary | ICD-10-CM | POA: Diagnosis present

## 2015-09-04 DIAGNOSIS — I10 Essential (primary) hypertension: Secondary | ICD-10-CM

## 2015-09-04 DIAGNOSIS — Z89511 Acquired absence of right leg below knee: Secondary | ICD-10-CM

## 2015-09-04 DIAGNOSIS — L97509 Non-pressure chronic ulcer of other part of unspecified foot with unspecified severity: Secondary | ICD-10-CM | POA: Diagnosis present

## 2015-09-04 DIAGNOSIS — N1330 Unspecified hydronephrosis: Secondary | ICD-10-CM | POA: Diagnosis present

## 2015-09-04 DIAGNOSIS — B372 Candidiasis of skin and nail: Secondary | ICD-10-CM | POA: Diagnosis present

## 2015-09-04 DIAGNOSIS — Z794 Long term (current) use of insulin: Secondary | ICD-10-CM

## 2015-09-04 DIAGNOSIS — K5901 Slow transit constipation: Secondary | ICD-10-CM | POA: Diagnosis present

## 2015-09-04 DIAGNOSIS — R509 Fever, unspecified: Secondary | ICD-10-CM

## 2015-09-04 DIAGNOSIS — R5381 Other malaise: Secondary | ICD-10-CM | POA: Diagnosis present

## 2015-09-04 DIAGNOSIS — E1142 Type 2 diabetes mellitus with diabetic polyneuropathy: Secondary | ICD-10-CM | POA: Diagnosis present

## 2015-09-04 DIAGNOSIS — Z79899 Other long term (current) drug therapy: Secondary | ICD-10-CM

## 2015-09-04 DIAGNOSIS — D62 Acute posthemorrhagic anemia: Secondary | ICD-10-CM | POA: Diagnosis present

## 2015-09-04 DIAGNOSIS — L739 Follicular disorder, unspecified: Secondary | ICD-10-CM

## 2015-09-04 DIAGNOSIS — L8990 Pressure ulcer of unspecified site, unspecified stage: Secondary | ICD-10-CM

## 2015-09-04 DIAGNOSIS — K648 Other hemorrhoids: Secondary | ICD-10-CM | POA: Diagnosis present

## 2015-09-04 DIAGNOSIS — Z8673 Personal history of transient ischemic attack (TIA), and cerebral infarction without residual deficits: Secondary | ICD-10-CM

## 2015-09-04 DIAGNOSIS — I999 Unspecified disorder of circulatory system: Secondary | ICD-10-CM

## 2015-09-04 LAB — RENAL FUNCTION PANEL
Albumin: 2.8 g/dL — ABNORMAL LOW (ref 3.5–5.0)
Anion gap: 9 (ref 5–15)
BUN: 22 mg/dL — ABNORMAL HIGH (ref 6–20)
CO2: 23 mmol/L (ref 22–32)
Calcium: 8.8 mg/dL — ABNORMAL LOW (ref 8.9–10.3)
Chloride: 106 mmol/L (ref 101–111)
Creatinine, Ser: 2.05 mg/dL — ABNORMAL HIGH (ref 0.44–1.00)
GFR calc Af Amer: 32 mL/min — ABNORMAL LOW (ref >60)
GFR calc non Af Amer: 28 mL/min — ABNORMAL LOW (ref >60)
Glucose, Bld: 157 mg/dL — ABNORMAL HIGH (ref 65–99)
Phosphorus: 3.4 mg/dL (ref 2.5–4.6)
Potassium: 4.8 mmol/L (ref 3.5–5.1)
Sodium: 138 mmol/L (ref 135–145)

## 2015-09-04 LAB — CBC
HCT: 25.8 % — ABNORMAL LOW (ref 36.0–46.0)
Hemoglobin: 7.9 g/dL — ABNORMAL LOW (ref 12.0–15.0)
MCH: 27.2 pg (ref 26.0–34.0)
MCHC: 30.6 g/dL (ref 30.0–36.0)
MCV: 89 fL (ref 78.0–100.0)
Platelets: 102 10*3/uL — ABNORMAL LOW (ref 150–400)
RBC: 2.9 MIL/uL — ABNORMAL LOW (ref 3.87–5.11)
RDW: 16.3 % — ABNORMAL HIGH (ref 11.5–15.5)
WBC: 7.3 10*3/uL (ref 4.0–10.5)

## 2015-09-04 LAB — GLUCOSE, CAPILLARY
Glucose-Capillary: 165 mg/dL — ABNORMAL HIGH (ref 65–99)
Glucose-Capillary: 148 mg/dL — ABNORMAL HIGH (ref 65–99)
Glucose-Capillary: 129 mg/dL — ABNORMAL HIGH (ref 65–99)
Glucose-Capillary: 137 mg/dL — ABNORMAL HIGH (ref 65–99)

## 2015-09-04 MED ORDER — PROMETHAZINE HCL 25 MG/ML IJ SOLN: 25.0000 mg | Freq: Four times a day (QID) | INTRAMUSCULAR | Status: DC | PRN

## 2015-09-04 MED ORDER — ALUM & MAG HYDROXIDE-SIMETH 200-200-20 MG/5ML PO SUSP: 30.0000 mL | ORAL | Status: DC | PRN

## 2015-09-04 MED ORDER — PROCHLORPERAZINE MALEATE 5 MG PO TABS
5.0000 mg | ORAL_TABLET | Freq: Three times a day (TID) | ORAL | Status: DC
  Administered 2015-09-04 – 2015-09-18 (×40): 5 mg via ORAL
  Filled 2015-09-04 (×42): qty 1

## 2015-09-04 MED ORDER — HYDROCODONE-ACETAMINOPHEN 5-325 MG PO TABS
1.0000 | ORAL_TABLET | Freq: Four times a day (QID) | ORAL | Status: DC | PRN
  Administered 2015-09-05 – 2015-09-18 (×16): 1 via ORAL
  Filled 2015-09-04 (×17): qty 1

## 2015-09-04 MED ORDER — SODIUM CHLORIDE 0.45 % IV SOLN
INTRAVENOUS | Status: DC
  Administered 2015-09-04 – 2015-09-06 (×3): via INTRAVENOUS

## 2015-09-04 MED ORDER — PROCHLORPERAZINE MALEATE 5 MG PO TABS: 5.0000 mg | ORAL_TABLET | Freq: Four times a day (QID) | ORAL | Status: DC | PRN

## 2015-09-04 MED ORDER — TRAZODONE HCL 50 MG PO TABS: 25.0000 mg | ORAL_TABLET | Freq: Every evening | ORAL | Status: DC | PRN

## 2015-09-04 MED ORDER — BISACODYL 10 MG RE SUPP: 10.0000 mg | Freq: Every day | RECTAL | Status: DC | PRN

## 2015-09-04 MED ORDER — INSULIN ASPART 100 UNIT/ML ~~LOC~~ SOLN
0.0000 [IU] | Freq: Three times a day (TID) | SUBCUTANEOUS | Status: DC
  Administered 2015-09-04 – 2015-09-12 (×8): 3 [IU] via SUBCUTANEOUS
  Administered 2015-09-13 – 2015-09-17 (×4): 4 [IU] via SUBCUTANEOUS
  Administered 2015-09-18: 3 [IU] via SUBCUTANEOUS

## 2015-09-04 MED ORDER — FLEET ENEMA 7-19 GM/118ML RE ENEM: 1.0000 | ENEMA | Freq: Once | RECTAL | Status: DC | PRN

## 2015-09-04 MED ORDER — PROCHLORPERAZINE EDISYLATE 5 MG/ML IJ SOLN: 5.0000 mg | Freq: Four times a day (QID) | INTRAMUSCULAR | Status: DC | PRN

## 2015-09-04 MED ORDER — ONDANSETRON HCL 4 MG PO TABS: 4.0000 mg | ORAL_TABLET | ORAL | Status: DC | PRN

## 2015-09-04 MED ORDER — SODIUM CHLORIDE 0.9 % IV SOLN
125.0000 mg | INTRAVENOUS | Status: DC
  Administered 2015-09-06: 125 mg via INTRAVENOUS
  Filled 2015-09-04 (×3): qty 10

## 2015-09-04 MED ORDER — CALCIUM CARBONATE ANTACID 500 MG PO CHEW
2.0000 | CHEWABLE_TABLET | Freq: Three times a day (TID) | ORAL | Status: DC
  Administered 2015-09-04 – 2015-09-18 (×41): 400 mg via ORAL
  Filled 2015-09-04 (×42): qty 2

## 2015-09-04 MED ORDER — DIPHENHYDRAMINE HCL 12.5 MG/5ML PO ELIX: 12.5000 mg | ORAL_SOLUTION | Freq: Four times a day (QID) | ORAL | Status: DC | PRN

## 2015-09-04 MED ORDER — GUAIFENESIN-DM 100-10 MG/5ML PO SYRP: 5.0000 mL | ORAL_SOLUTION | Freq: Four times a day (QID) | ORAL | Status: DC | PRN

## 2015-09-04 MED ORDER — ONDANSETRON HCL 4 MG/2ML IJ SOLN
4.0000 mg | INTRAMUSCULAR | Status: DC | PRN
  Administered 2015-09-04: 4 mg via INTRAVENOUS
  Filled 2015-09-04: qty 2

## 2015-09-04 MED ORDER — PROCHLORPERAZINE 25 MG RE SUPP: 12.5000 mg | Freq: Four times a day (QID) | RECTAL | Status: DC | PRN

## 2015-09-04 MED ORDER — INSULIN ASPART 100 UNIT/ML ~~LOC~~ SOLN
0.0000 [IU] | Freq: Every day | SUBCUTANEOUS | Status: DC
  Administered 2015-09-12: 2 [IU] via SUBCUTANEOUS

## 2015-09-04 MED ORDER — ACETAMINOPHEN 325 MG PO TABS
325.0000 mg | ORAL_TABLET | ORAL | Status: DC | PRN
  Administered 2015-09-08: 650 mg via ORAL
  Filled 2015-09-04 (×3): qty 2

## 2015-09-04 NOTE — Progress Notes (Signed)
Patient ID: Cheryl HansenMichelle L Dougal, female   DOB: 01/29/1965, [age] y.o.   MRN: [id number] Patient arrived from [id number] with RN and patient belongings. Patient oriented to room, rehab fall prevention plan, rehab safety plan, health resource notebook, rehab schedule, and nurse call system. Patient resting comfortably in bed with minimal complaints of a headache. Chosen not to take medication, just to reposition and relax.

## 2015-09-04 NOTE — Clinical Social Work Note (Signed)
Patient transferred from 3S to 6E. Handoff information given to 6E CSW.  This CSW signing off.  Braxtyn Dorff, CSW [phone]  

## 2015-09-04 NOTE — Progress Notes (Signed)
S: No acute events.  Continues to have impressive urine output.  Creatinine slowly drifting downward.  Nausea today.  O:BP (!) 163/82 (BP Location: Right Arm)   Pulse (!) 102   Temp 98.7 F (37.1 C) (Oral)   Resp 16   Ht '5\' 7"'  (1.702 m)   Wt 101 kg (222 lb 10.6 oz)   LMP  (LMP Unknown)   SpO2 98%   BMI 34.87 kg/m   Intake/Output Summary (Last 24 hours) at 09/04/15 1021 Last data filed at 09/04/15 0708  Gross per 24 hour  Intake             1630 ml  Output             4675 ml  Net            -3045 ml   Weight change: -4.1 kg (-9 lb 0.6 oz) Gen: laying in bed, NAD CVS:RRR Resp:Clear Abd:+ BS NTND Ext:Rt BKA.  No edema.  Mult chronic lesions over arms/legs/torso NEURO: AAO x 3 GU clear pale yellow urine in Foley   . calcium carbonate  2 tablet Oral TID  . ferric gluconate (FERRLECIT/NULECIT) IV  125 mg Intravenous QODAY  . insulin aspart  0-20 Units Subcutaneous TID WC  . insulin aspart  0-5 Units Subcutaneous QHS  . mouth rinse  15 mL Mouth Rinse BID   No results found. BMET    Component Value Date/Time   NA 138 09/04/2015 0338   K 4.8 09/04/2015 0338   CL 106 09/04/2015 0338   CO2 23 09/04/2015 0338   GLUCOSE 157 (H) 09/04/2015 0338   BUN 22 (H) 09/04/2015 0338   CREATININE 2.05 (H) 09/04/2015 0338   CALCIUM 8.8 (L) 09/04/2015 0338   GFRNONAA 28 (L) 09/04/2015 0338   GFRAA 32 (L) 09/04/2015 0338   CBC    Component Value Date/Time   WBC 7.3 09/04/2015 0336   RBC 2.90 (L) 09/04/2015 0336   HGB 7.9 (L) 09/04/2015 0336   HCT 25.8 (L) 09/04/2015 0336   HCT 20.8 (L) 08/26/2015 1436   PLT 102 (L) 09/04/2015 0336   MCV 89.0 09/04/2015 0336   MCH 27.2 09/04/2015 0336   MCHC 30.6 09/04/2015 0336   RDW 16.3 (H) 09/04/2015 0336   LYMPHSABS 1.1 09/02/2015 0427   MONOABS 0.5 09/02/2015 0427   EOSABS 0.2 09/02/2015 0427   BASOSABS 0.0 09/02/2015 0427     Assessment/ Plan:  1. ARF in setting of Bil hydronephrosis,  BOO as UO excellent after foley placement and  Scr cont to decrease.  Repeat renal US showing persistence of bilateral hydro- would have expected this to improve more but pt has robust urine output with downtrending creatinine.  Urology consulted as well, appreciate recs.  Will need repeat imaging after post-obstructive diuresis (in a few weeks).  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    2. Anemia/thrombocytopenia of unclear etiology.  BM biopsy has been unrevealing. Iron % sat is 15 with worsening anemia.  Repleting iron stores with Ferric gluconate IV (started 8/29), Aranesp 60 mcg given  as well (8/29)  3. DM : likely causing a neurogenic bladder, has neuropathy as well.  She may need to self-cath after the Foley comes out.  4.  Chronic diabetic skin lesions, awaiting biopsy results 8/27. 5. Met acidosis, resolved 6. Hypocalcemia on oral replacement 7. Hypomag 8. Hypokalemia, resolved  Madelon Lips  [facility] pgr [phone] cell: [phone]

## 2015-09-04 NOTE — H&P (Signed)
Physical Medicine and Rehabilitation Admission H&P       Chief Complaint  Patient presents with  . Debility in patient with severe anemia and R-BKA in setting of peripheral neuropathy    HPI:   [name] a 50 y.o.femalewith history of T2DM with neuropathy, ICH, diabetic foot ulcer s/p R-BKA 7/13, medication non-compliance for past year who was admitted on 08/23/15 with malaise, inability to walk, dyspnea and numbness of her hands. She was found to have multiple skin lesions with metabolic acidosis, severe normocytic-normochromic anemia--hgb 4.8 and thrombocytopenia - 10. She was found to have acute renal failure and renal ultrasound showed normal kidneys with distended bladder and bilateral hydronephrosis. She was transfused with 2 units PRBC and one unit platelets and started on Levaquin for UTI. Dr [name] consulted for work up and recommended skin biopsy as well as autoimmune studies. Bone marrow biopsy negative and skin bx path pending. Dr. Justin Mend consulted for input and felt that assumed ARF in setting of bilateral hydronephrosis and BOO. Urine output improved with foley and SCr on downward trend. He felt lesions likely chronic diabetic lesions and recommended GUconsult.   Repeat ultrasound 8/27 showed moderate bilateral hydronephrosis.   CT abdomen/pelvis done due to concerns of rectovaginal fistula. This showed diffuse anasarca, no evidence of fistula and moderately enlarged bilateral inguinal lymph nodes.   Dr.Herrick evaluated patient and felt that urinary retention has been chronic leading to ureteral reflux and hydronephrosis. He felt that hydronephrosis had improved with diuresis/foley and that ureteral stents not recommended. Hydronephrosis should improve over next few weeks with diuresis--to repeat voiding trial and to educate patient on in and out caths v/s indwelling foley. She continues to have impressive UOP with downward trend in Scr- 2.05 and weight to 101 kg.   Skin Biopsy revealed epidermal hyperplasia with fibrosing granulation tissue.  She continues to have persistent coagulopathy--PT/PTT mixing studies pending.  Heme/Onc recommends erythropoietin/iron for anemia and questions presence of lupus anticoagulant.  To transfuse as needed for platelets less than 10K.  Patient noted to be debilitated and CIR recommended for follow up therapy.   Patient states she had a stroke many years ago which affected her vision in the right eye lower quadrant   Review of Systems  Constitutional: Positive for malaise/fatigue. Negative for fever.  HENT: Negative for hearing loss.   Eyes: Positive for blurred vision.  Respiratory: Negative for cough and hemoptysis.   Cardiovascular: Negative for chest pain and palpitations.  Gastrointestinal: Positive for nausea (ongoing for past couple of months but worse since yesterday) and vomiting. Negative for abdominal pain, constipation and heartburn.  Musculoskeletal: Positive for myalgias.  Skin: Positive for rash. Negative for itching.  Neurological: Positive for sensory change (diffuse numbness LLE for past year.  Numbness bilateral hands for couple of weeks) and weakness. Negative for speech change, focal weakness and headaches.  Psychiatric/Behavioral: Negative for depression. The patient does not have insomnia.          Past Medical History:  Diagnosis Date  . Arthritis   . Diabetes mellitus   . Diabetic foot ulcer associated with type 2 diabetes mellitus ([place])   . Hypertension   . Intracerebral hemorrhage ([place]) As a teenager    States she had burst blood vessel as teenager, now with resultant minor visual field and hearing deficits  . Neuropathy ([place])   . Stroke [facility])          Past Surgical History:  Procedure Laterality Date  . AMPUTATION  11/24/2010  Procedure: AMPUTATION FOOT;  Surgeon: Wylene Simmer, MD;  Location: [place];  Service: Orthopedics;  Laterality: Right;  Right  FOOT TRANS  METATARSAL AMPUTATION  . AMPUTATION  07/02/2011   Procedure: AMPUTATION BELOW KNEE;  Surgeon: Wylene Simmer, MD;  Location: [place];  Service: Orthopedics;  Laterality: Right;  . APPLICATION OF WOUND VAC  07/02/2011   Procedure: APPLICATION OF WOUND VAC;  Surgeon: Wylene Simmer, MD;  Location: [place];  Service: Orthopedics;  Laterality: Right;  . BRAIN SURGERY  1980's   Previous brain surgery in [place], [place]  . CESAREAN SECTION    . I&D EXTREMITY  11/24/2010   Procedure: IRRIGATION AND DEBRIDEMENT EXTREMITY;  Surgeon: Wylene Simmer, MD;  Location: [place];  Service: Orthopedics;  Laterality: Left;  Debriedment of left plantar ulcer and trimming of toenails  . I&D EXTREMITY  07/02/2011   Procedure: IRRIGATION AND DEBRIDEMENT EXTREMITY;  Surgeon: Wylene Simmer, MD;  Location: [place];  Service: Orthopedics;  Laterality: Left;  I & D of Left Foot  . I&D EXTREMITY  07/06/2011   Procedure: IRRIGATION AND DEBRIDEMENT EXTREMITY;  Surgeon: Wylene Simmer, MD;  Location: [place];  Service: Orthopedics;  Laterality: Right;  IRRIGATION/DEBRIDEMENT RIGHT BELOW KNEE AMPUTATION          Family History  Problem Relation Age of Onset  . Diabetes Mother   . Hypertension Mother   . Hyperlipidemia Mother   . Diabetes Father   . Cancer Father     prostate  . Hyperlipidemia Father   . Hypertension Father   . Diabetes Brother   . Peripheral Artery Disease Brother     has had toes amputated    Social History:  Lives with two children--mother from [place] with children at this time. Independent at wheelchair level. reports that she has never smoked. She has never used smokeless tobacco. She reports that she does not drink alcohol or use drugs.   Allergies: No Known Allergies          Medications Prior to Admission  Medication Sig Dispense Refill  . aspirin 81 MG EC tablet Take 162-243 mg by mouth 3 (three) times daily as needed (chest pain). Swallow whole.    .  Aspirin-Acetaminophen-Caffeine (GOODY HEADACHE PO) Take 1 packet by mouth 2 (two) times daily as needed (headache).    . Menthol, Topical Analgesic, (ICY HOT EX) Apply 1 application topically 2 (two) times daily as needed (pain).    . Naphazoline HCl (CLEAR EYES OP) Place 1 drop into both eyes daily as needed (dry eyes/itching).    . naproxen sodium (ALEVE) 220 MG tablet Take 440 mg by mouth 2 (two) times daily as needed (pain).    Marland Kitchen amLODipine (NORVASC) 10 MG tablet Take 1 tablet (10 mg total) by mouth daily. 30 tablet 0  . insulin aspart (NOVOLOG) 100 UNIT/ML injection Inject 3 Units into the skin 3 (three) times daily with meals. 1 vial 0    Home: Home Living Family/patient expects to be discharged to:: Private residence Living Arrangements: Children Available Help at Discharge: Family Type of Home: House Home Access: Stairs to enter Technical brewer of Steps: 1 Entrance Stairs-Rails: None Home Layout: Two level, Able to live on main level with bedroom/bathroom Alternate Level Stairs-Number of Steps: 14 Alternate Level Stairs-Rails: Left Bathroom Shower/Tub: Chiropodist: Standard Home Equipment: Environmental consultant - 2 wheels, Wheelchair - manual (reports getting ) Additional Comments: pt reports hopping with RW PTA   Functional History: Prior Function Level of Independence:  Independent with assistive device(s) Comments: Patient does not use her prosthesis.  Uses w/c for mobility, and walker for bathroom.  Uses cabs for transportation, but having trouble getting into/out of cab with weakness.  Functional Status:  Mobility: Bed Mobility Overal bed mobility: Needs Assistance Bed Mobility: Rolling, Supine to Sit, Sit to Supine Rolling: Supervision Supine to sit: Mod assist Sit to supine: Mod assist General bed mobility comments: up in recliner Transfers Overall transfer level: Needs assistance Equipment used: Rolling walker (2 wheeled) Transfer via  Lift Equipment: Stedy Transfers: Sit to/from [employer] to Stand: Min assist Stand pivot transfers: +2 physical assistance, Max assist (Use of Denna Haggard) General transfer comment: pt with L LE blocked and able to compelte sit<>Stand. pt unable to sustain static standing      ADL: ADL Overall ADL's : Needs assistance/impaired Grooming: Wash/dry hands, Set up Upper Body Bathing: Minimal assitance Lower Body Bathing: Maximal assistance Toilet Transfer Details (indicate cue type and reason): pt (A) to place on bed pan by tech prior to session. pt long sitting on pan in bed General ADL Comments: Upon initiating sitting up for self care pt with increase nausea and needed to return to supine.  Cognition: Cognition Overall Cognitive Status: Within Functional Limits for tasks assessed Orientation Level: Oriented X4 Cognition Arousal/Alertness: Awake/alert Behavior During Therapy: WFL for tasks assessed/performed Overall Cognitive Status: Within Functional Limits for tasks assessed Area of Impairment: Following commands Following Commands: Follows multi-step commands inconsistently, Follows multi-step commands with increased time General Comments: Pt needed cues during session when provided two step commands   Blood pressure (!) 163/82, pulse (!) 102, temperature 98.7 F (37.1 C), temperature source Oral, resp. rate 16, height '5\' 7"'  (1.702 m), weight 101 kg (222 lb 10.6 oz), SpO2 98 %. Physical Exam  Lab Results Last 48 Hours        Results for orders placed or performed during the hospital encounter of 08/23/15 (from the past 48 hour(s))  Glucose, capillary     Status: Abnormal   Collection Time: 09/02/15 12:08 PM  Result Value Ref Range   Glucose-Capillary 127 (H) 65 - 99 mg/dL  Glucose, capillary     Status: Abnormal   Collection Time: 09/02/15  1:39 PM  Result Value Ref Range   Glucose-Capillary 140 (H) 65 - 99 mg/dL   Comment 1 Notify RN    Comment 2 Document in  Chart   Glucose, capillary     Status: Abnormal   Collection Time: 09/02/15  4:19 PM  Result Value Ref Range   Glucose-Capillary 141 (H) 65 - 99 mg/dL   Comment 1 Notify RN    Comment 2 Document in Chart   Glucose, capillary     Status: Abnormal   Collection Time: 09/02/15  9:14 PM  Result Value Ref Range   Glucose-Capillary 147 (H) 65 - 99 mg/dL  Renal function panel     Status: Abnormal   Collection Time: 09/03/15  3:58 AM  Result Value Ref Range   Sodium 143 135 - 145 mmol/L   Potassium 4.6 3.5 - 5.1 mmol/L   Chloride 110 101 - 111 mmol/L   CO2 24 22 - 32 mmol/L   Glucose, Bld 93 65 - 99 mg/dL   BUN 25 (H) 6 - 20 mg/dL   Creatinine, Ser 2.24 (H) 0.44 - 1.00 mg/dL   Calcium 8.5 (L) 8.9 - 10.3 mg/dL   Phosphorus 3.5 2.5 - 4.6 mg/dL   Albumin 2.5 (L) 3.5 - 5.0 g/dL  GFR calc non Af Amer 25 (L) >60 mL/min   GFR calc Af Amer 29 (L) >60 mL/min    Comment: (NOTE) The eGFR has been calculated using the CKD EPI equation. This calculation has not been validated in all clinical situations. eGFR's persistently <60 mL/min signify possible Chronic Kidney Disease.   Anion gap 9 5 - 15  Lactate dehydrogenase     Status: None   Collection Time: 09/03/15  3:59 AM  Result Value Ref Range   LDH 168 98 - 192 U/L  Glucose, capillary     Status: None   Collection Time: 09/03/15  8:52 AM  Result Value Ref Range   Glucose-Capillary 99 65 - 99 mg/dL   Comment 1 Notify RN    Comment 2 Document in Chart   Glucose, capillary     Status: Abnormal   Collection Time: 09/03/15 12:31 PM  Result Value Ref Range   Glucose-Capillary 118 (H) 65 - 99 mg/dL   Comment 1 Notify RN    Comment 2 Document in Chart   Glucose, capillary     Status: Abnormal   Collection Time: 09/03/15  5:22 PM  Result Value Ref Range   Glucose-Capillary 106 (H) 65 - 99 mg/dL   Comment 1 Notify RN    Comment 2 Document in Chart   Glucose, capillary     Status: Abnormal    Collection Time: 09/03/15  9:34 PM  Result Value Ref Range   Glucose-Capillary 122 (H) 65 - 99 mg/dL  CBC     Status: Abnormal   Collection Time: 09/04/15  3:36 AM  Result Value Ref Range   WBC 7.3 4.0 - 10.5 K/uL   RBC 2.90 (L) 3.87 - 5.11 MIL/uL   Hemoglobin 7.9 (L) 12.0 - 15.0 g/dL   HCT 25.8 (L) 36.0 - 46.0 %   MCV 89.0 78.0 - 100.0 fL   MCH 27.2 26.0 - 34.0 pg   MCHC 30.6 30.0 - 36.0 g/dL   RDW 16.3 (H) 11.5 - 15.5 %   Platelets 102 (L) 150 - 400 K/uL    Comment: CONSISTENT WITH PREVIOUS RESULT  Renal function panel     Status: Abnormal   Collection Time: 09/04/15  3:38 AM  Result Value Ref Range   Sodium 138 135 - 145 mmol/L   Potassium 4.8 3.5 - 5.1 mmol/L   Chloride 106 101 - 111 mmol/L   CO2 23 22 - 32 mmol/L   Glucose, Bld 157 (H) 65 - 99 mg/dL   BUN 22 (H) 6 - 20 mg/dL   Creatinine, Ser 2.05 (H) 0.44 - 1.00 mg/dL   Calcium 8.8 (L) 8.9 - 10.3 mg/dL   Phosphorus 3.4 2.5 - 4.6 mg/dL   Albumin 2.8 (L) 3.5 - 5.0 g/dL   GFR calc non Af Amer 28 (L) >60 mL/min   GFR calc Af Amer 32 (L) >60 mL/min    Comment: (NOTE) The eGFR has been calculated using the CKD EPI equation. This calculation has not been validated in all clinical situations. eGFR's persistently <60 mL/min signify possible Chronic Kidney Disease.   Anion gap 9 5 - 15  Glucose, capillary     Status: Abnormal   Collection Time: 09/04/15  8:31 AM  Result Value Ref Range   Glucose-Capillary 165 (H) 65 - 99 mg/dL     Imaging Results (Last 48 hours)  No results found.       Medical Problem List and Plan: 1.  Deconditioning secondary to multiple medical  issues including severe anemia, acute renal failure 2.  DVT Prophylaxis/Anticoagulation: Mechanical: Sequential compression devices, below knee Bilateral lower extremities 3. Pain Management: Norco prn for chronic back pain 4. Mood: Team to provide ego support. LCSW to follow for evaluation and support.  5. Neuropsych:  This patient is capable of making decisions on her own behalf. 6. Skin/Wound Care: Aquacel Silver for MASD gluteal cleft.  7. Fluids/Electrolytes/Nutrition:  Will schedule compazine before meals for ongoing nausea--question gastroparesis. Discussed food choices to help with intake/nausea. Will consult dietician for education. 10 T2DM with neuropathy, nephropathy, vasculopathy and question gastroparesis: Monitor BS ac/hs. Will use SSI for elevated BS.   11. CKD: Avoid NSAIDs and other nephrotoxic drugs. On gentle hydration --continue foley for strict I/O and ongoing diuresis.  Hypocalcemia managed with tums. Monitor weights daily.  12. Bilateral hydronephrosis due to chronic urinary retention:  Foley for now as output about 4000 cc last 24 hours. Voiding trial as output decreases.  13. Anemia of chronic disease: In IV iron for supplement--received dose of Aransep on 8/29.  14. Thrombocytopenia: Monitor with serial checks--currently 102,000 Transfuse prn < 10K    Post Admission Physician Evaluation: 1. Functional deficits secondary  to multiple medical issues including severe anemia, acute renal failure. 2. Patient is admitted to receive collaborative, interdisciplinary care between the physiatrist, rehab nursing staff, and therapy team. 3. Patient's level of medical complexity and substantial therapy needs in context of that medical necessity cannot be provided at a lesser intensity of care such as a SNF. 4. Patient has experienced substantial functional loss from his/her baseline which was documented above under the "Functional History" and "Functional Status" headings.  Judging by the patient's diagnosis, physical exam, and functional history, the patient has potential for functional progress which will result in measurable gains while on inpatient rehab.  These gains will be of substantial and practical use upon discharge  in facilitating mobility and self-care at the household  level. 5. Physiatrist will provide 24 hour management of medical needs as well as oversight of the therapy plan/treatment and provide guidance as appropriate regarding the interaction of the two. 6. 24 hour rehab nursing will assist with bladder management, bowel management, safety, skin/wound care, disease management, medication administration, pain management and patient education  and help integrate therapy concepts, techniques,education, etc. 7. PT will assess and treat for/with: pre gait, gait training, endurance , safety, equipment, neuromuscular re education.   Goals are: Supervision. 8. OT will assess and treat for/with: ADLs, Cognitive perceptual skills, Neuromuscular re education, safety, endurance, equipment.   Goals are: Supervision to modified independent. Therapy may proceed with showering this patient. 9. SLP will assess and treat for/with: NA.  Goals are: NA. 10. Case Management and Social Worker will assess and treat for psychological issues and discharge planning. 11. Team conference will be held weekly to assess progress toward goals and to determine barriers to discharge. 12. Patient will receive at least 3 hours of therapy per day at least 5 days per week. 13. ELOS: 16-19 days       14. Prognosis:  excellent     Charlett Blake M.D. [facility] FAAPM&R (Sports Med, Neuromuscular Med) Diplomate [place] Board of Electrodiagnostic Med  09/04/2015

## 2015-09-04 NOTE — Progress Notes (Signed)
IP PROGRESS NOTE  Subjective: Nausea this morning.  Objective: Vital signs in last 24 hours: Blood pressure (!) 142/68, pulse 99, temperature 99.5 F (37.5 C), temperature source Oral, resp. rate 16, height '5\' 7"'  (1.702 m), weight 222 lb 10.6 oz (101 kg), SpO2 98 %.  Intake/Output from previous day: 08/30 0701 - 08/31 0700 In: 1870 [P.O.:720; I.V.:1150] Out: 7017 [Urine:5975]  Physical Exam: Not performed today   Lab Results:  Recent Labs  09/02/15 0427 09/04/15 0336  WBC 6.6 7.3  HGB 7.1* 7.9*  HCT 23.1* 25.8*  PLT 91* 102*    BMET  Recent Labs  09/03/15 0358 09/04/15 0338  NA 143 138  K 4.6 4.8  CL 110 106  CO2 24 23  GLUCOSE 93 157*  BUN 25* 22*  CREATININE 2.24* 2.05*  CALCIUM 8.5* 8.8*   Erythropoietin level on 08/31/2015-8.1 08/30/2015-PT 17.2, PTT 38 Studies/Results: No results found.  Medications: I have reviewed the patient's current medications.  Assessment/Plan:  1. Severe anemia and thrombocytopenia-improved following multiple transfusions  Bone marrow biopsy 08/25/2015-no evidence of a hematopoietic malignancy, granulomatous disease, or metastatic carcinoma. Hypercellular marrow 2. Renal failure/bilateral hydronephrosis, urinary retention-creatinine improved , good urine output  Etiology of urinary retention not established, potentially related toDiabetic neuropathy 3. Pustular/ulcerated skin lesions/decubitus ulcers-Biopsy from the left breast and left thigh revealed epidermal hyperplasia with fibrosing granulation tissue 4. Yeast rash beneath the breasts and in the groin-improved 5. Diabetes 6. Status post right below-knee amputation in 2012 7. History of "neuropathy " 8. Coagulopathy-persistent following vitamin K, PT and PTT mixing studies pending  She is stable from a hematologic standpoint. I will follow-up on the PT and PTT mixing studies. She may have a lupus anticoagulant. She will need close outpatient follow-up of the  hemoglobin and platelet count with her primary physician. I am available to see her as needed. Recommendations: 1. Continue erythropoietin/iron via primary physician or nephrology 2. Please call Hematology as needed  I will check on her during the week of 09/08/2015 if she remains in the hospital.       LOS: 12 days   Betsy Coder, MD   09/04/2015, 8:22 AM

## 2015-09-04 NOTE — Progress Notes (Signed)
Rehab admissions - I met with patient this am.  I also discussed her case with Pamela Love, PA.  Patient plans to have her mother assist initially upon discharge from rehab.  I spoke with Dr. Rai today and she has cleared the patient medically for rehab today.  Bed available and will admit to acute inpatient rehab today.  Call me for questions.  #[phone] 

## 2015-09-04 NOTE — PMR Pre-admission (Signed)
PMR Admission Coordinator Pre-Admission Assessment  Patient: [name] is an 50 y.o., female MRN: [id number] DOB: 20-Aug-1965 Height: '5\' 7"'  (170.2 cm) Weight: 101 kg (222 lb 10.6 oz)              Insurance Information Self pay - no insurance  Medicaid Application Date:        Case Manager:   Disability Application Date:        Case Worker:    Emergency Facilities manager Information    Name Relation Home Work Mobile   Glass,Patsy Mother [phone]  [phone]     Current Medical History  Patient Admitting Diagnosis:  Debility, anemia, old R BKA  History of Present Illness: A 50 y.o.femalewith history of T2DM with neuropathy, ICH, diabetic foot ulcer s/p R-BKA 7/13, medication non-compliance for past year who was admitted on 08/23/15 with malaise, inability to walk, dyspnea and numbness of her hands. She was found to have multiple skin lesions with metabolic acidosis, severe normocytic-normochromic anemia--hgb 4.8 and thrombocytopenia - 10. She was found to have acute renal failure and renal ultrasound showed normal kidneys with distended bladder and bilateral hydronephrosis. She was transfused with 2 units PRBC and one unit platelets and started on Levaquin for UTI. Dr [name] consulted for work up and recommended skin biopsy as well as autoimmune studies. Bone marrow biopsy negative and skin bx path pending. Dr. Justin Mend consulted for input and felt that assumed ARF in setting of bilateral hydronephrosis and BOO. Urine output improved with foley and SCr on downward trend. He felt lesions likely chronic diabetic lesions and recommended GUconsult.   Repeat ultrasound 8/27 showed moderate bilateral hydronephrosis.   CT abdomen/pelvis done due to concerns of rectovaginal fistula. This showed diffuse anasarca, no evidence of fistula and moderately enlarged bilateral inguinal lymph nodes.   Dr.Herrick evaluated patient and felt that urinary retention has been chronic  leading to ureteral reflux and hydronephrosis. He felt that hydronephrosis had improved with diuresis/foley and that ureteral stents not recommended. Hydronephrosis should improve over next few weeks with diuresis--to repeat voiding trial and to educate patient on in and out caths v/s indwelling foley. She continues to have impressive UOP with downward trend in Scr- 2.05 and weight to 101 kg.  Skin Biopsy revealed epidermal hyperplasia with fibrosing granulation tissue.  She continues to have persistent coagulopathy--PT/PTT mixing studies pending.  Heme/Onc recommends erythropoietin/iron for anemia and questions presence of lupus anticoagulant.  To transfuse as needed for platelets less than 10K.  Patient noted to be debilitated and CIR recommended for follow up therapy.    Total: 5=NIH  Past Medical History  Past Medical History:  Diagnosis Date  . Arthritis   . Diabetes mellitus   . Diabetic foot ulcer associated with type 2 diabetes mellitus ([place])   . Hypertension   . Intracerebral hemorrhage ([place]) As a teenager    States she had burst blood vessel as teenager, now with resultant minor visual field and hearing deficits  . Neuropathy ([place])   . Stroke [facility])     Family History  family history includes Cancer in her father; Diabetes in her brother, father, and mother; Hyperlipidemia in her father and mother; Hypertension in her father and mother; Peripheral Artery Disease in her brother.  Prior Rehab/Hospitalizations: Was on CIR 2013.  Has the patient had major surgery during 100 days prior to admission? No  Current Medications   Current Facility-Administered Medications:  .  0.45 % sodium chloride infusion, , Intravenous, Continuous, Legrand Como  Mercy Moore, MD, Last Rate: 50 mL/hr at 09/03/15 1325 .  acetaminophen (TYLENOL) tablet 650 mg, 650 mg, Oral, Q6H PRN, 650 mg at 09/02/15 0844 **OR** acetaminophen (TYLENOL) suppository 650 mg, 650 mg, Rectal, Q6H PRN, Elwin Mocha, MD .  calcium  carbonate (TUMS - dosed in mg elemental calcium) chewable tablet 400 mg of elemental calcium, 2 tablet, Oral, TID, Fleet Contras, MD, 400 mg of elemental calcium at 09/04/15 0852 .  ferric gluconate (NULECIT) 125 mg in sodium chloride 0.9 % 100 mL IVPB, 125 mg, Intravenous, QODAY, Madelon Lips, MD, 125 mg at 09/02/15 1234 .  hydrALAZINE (APRESOLINE) injection 10 mg, 10 mg, Intravenous, Q8H PRN, Elwin Mocha, MD .  HYDROcodone-acetaminophen (NORCO/VICODIN) 5-325 MG per tablet 1 tablet, 1 tablet, Oral, Q4H PRN, Ladell Pier, MD, 1 tablet at 08/31/15 2244 .  insulin aspart (novoLOG) injection 0-20 Units, 0-20 Units, Subcutaneous, TID WC, Javier Glazier, MD, 4 Units at 09/04/15 [phone] .  insulin aspart (novoLOG) injection 0-5 Units, 0-5 Units, Subcutaneous, QHS, Javier Glazier, MD .  lidocaine (XYLOCAINE) 2 % injection 0-20 mL, 0-20 mL, Intradermal, Once PRN, Lisette Abu, PA-C .  MEDLINE mouth rinse, 15 mL, Mouth Rinse, BID, Allie Bossier, MD, 15 mL at 09/04/15 1000 .  ondansetron (ZOFRAN) tablet 4 mg, 4 mg, Oral, Q4H PRN **OR** ondansetron (ZOFRAN) injection 4 mg, 4 mg, Intravenous, Q4H PRN, Ripudeep K Rai, MD .  promethazine (PHENERGAN) injection 25 mg, 25 mg, Intravenous, Q6H PRN, Ripudeep K Rai, MD .  psyllium (HYDROCIL/METAMUCIL) packet 1 packet, 1 packet, Oral, Daily PRN, Allie Bossier, MD .  traZODone (DESYREL) tablet 25 mg, 25 mg, Oral, QHS PRN, Elwin Mocha, MD, 25 mg at 08/31/15 2244  Patients Current Diet: Diet Carb Modified Fluid consistency: Thin; Room service appropriate? Yes  Precautions / Restrictions Precautions Precautions: Fall Precaution Comments: R BKA 2013 Restrictions Weight Bearing Restrictions: No   Has the patient had 2 or more falls or a fall with injury in the past year?No  Prior Activity Level Limited Community (1-2x/wk): Went out 2-3 X a week, not driving, takes a cab if she needs transportation.  Home Assistive Devices /  Equipment Home Assistive Devices/Equipment: Environmental consultant (specify type), Wheelchair (front wheel walker) Home Equipment: Walker - 2 wheels, Wheelchair - manual (reports getting )  Prior Device Use: Indicate devices/aids used by the patient prior to current illness, exacerbation or injury? Manual wheelchair and Walker  Prior Functional Level Prior Function Level of Independence: Independent with assistive device(s) Comments: Patient does not use her prosthesis.  Uses w/c for mobility, and walker for bathroom.  Uses cabs for transportation, but having trouble getting into/out of cab with weakness.  Self Care: Did the patient need help bathing, dressing, using the toilet or eating?  Independent  Indoor Mobility: Did the patient need assistance with walking from room to room (with or without device)? Independent  Stairs: Did the patient need assistance with internal or external stairs (with or without device)? Independent  Functional Cognition: Did the patient need help planning regular tasks such as shopping or remembering to take medications? Independent  Current Functional Level Cognition  Overall Cognitive Status: Within Functional Limits for tasks assessed Orientation Level: Oriented X4 Following Commands: Follows multi-step commands inconsistently, Follows multi-step commands with increased time General Comments: Pt needed cues during session when provided two step commands    Extremity Assessment (includes Sensation/Coordination)  Upper Extremity Assessment: Generalized weakness  Lower Extremity Assessment: Defer to PT evaluation RLE  Deficits / Details: BKA.  Able to move against gravity LLE Deficits / Details: Strength grossly 3-/5;  Reports new numbness in foot. LLE Sensation: history of peripheral neuropathy    ADLs  Overall ADL's : Needs assistance/impaired Grooming: Wash/dry hands, Set up Upper Body Bathing: Minimal assitance Lower Body Bathing: Maximal assistance Toilet  Transfer Details (indicate cue type and reason): pt (A) to place on bed pan by tech prior to session. pt long sitting on pan in bed General ADL Comments: Upon initiating sitting up for self care pt with increase nausea and needed to return to supine.    Mobility  Overal bed mobility: Needs Assistance Bed Mobility: Rolling, Supine to Sit, Sit to Supine Rolling: Supervision Supine to sit: Mod assist Sit to supine: Mod assist General bed mobility comments: up in recliner    Transfers  Overall transfer level: Needs assistance Equipment used: Rolling walker (2 wheeled) Transfer via Lift Equipment: Stedy Transfers: Sit to/from [employer] to Stand: Min assist Stand pivot transfers: +2 physical assistance, Max assist (Use of Denna Haggard) General transfer comment: pt with L LE blocked and able to compelte sit<>Stand. pt unable to sustain static standing    Ambulation / Gait / Stairs / Office manager / Balance Balance Overall balance assessment: Needs assistance Sitting-balance support: Bilateral upper extremity supported, Feet supported Sitting balance-Leahy Scale: Good Standing balance support: Bilateral upper extremity supported, During functional activity Standing balance-Leahy Scale: Poor Standing balance comment: Walker and +20mn A for static standing    Special needs/care consideration BiPAP/CPAP No CPM No Continuous Drip IV 0.45% NS at 50 mL/hr Dialysis No       Life Vest No Oxygen No Special Bed No Trach Size No Wound Vac (area) No    Skin: Old R BKA.  Has skin lesions X 1 yr likely related to DM                            Bowel mgmt: Last BM 09/02/15 Bladder mgmt: Urinary catheter in place Diabetic mgmt:  Yes, non-compliance    Previous Home Environment Living Arrangements: Children Available Help at Discharge: Family Type of Home: House Home Layout: Two level, Able to live on main level with bedroom/bathroom Alternate Level Stairs-Rails:  Left Alternate Level Stairs-Number of Steps: 14 Home Access: Stairs to enter Entrance Stairs-Rails: None Entrance Stairs-Number of Steps: 1 Bathroom Shower/Tub: TChiropodist SOnycha Other (Comment) (potential need for assistance once discharged) Additional Comments: pt reports hopping with RW PTA  Discharge Living Setting Plans for Discharge Living Setting: Patient's home, House, Lives with (comment) (Lives with her children.) Type of Home at Discharge: House Discharge Home Layout: Two level, 1/2 bath on main level, Bed/bath upstairs (Has a hospital type bed on main level.) Alternate Level Stairs-Number of Steps: 14 Discharge Home Access: Stairs to enter Entrance Stairs-Number of Steps: 1 step at front entry Does the patient have any problems obtaining your medications?: Yes (Describe) (no insurance or transportation)  Social/Family/Support Systems Patient Roles: Parent, Other (Comment) (Has a mom, and 49yo and 185yo children.) Contact Information: PTeacher, music- mother Anticipated Caregiver: Mom Anticipated Caregiver's Contact Information: Patsy - mom (h) [phone] (c) [phone]/Limitations of Caregiver: [place].  Mom has a husband in the hospital and she has limited time available to care for patient. Caregiver Availability: Intermittent Discharge  Plan Discussed with Primary Caregiver: Yes Is Caregiver In Agreement with Plan?: Yes Does Caregiver/Family have Issues with Lodging/Transportation while Pt is in Rehab?: No  Goals/Additional Needs Patient/Family Goal for Rehab: PT/OT mod I and supervision goals Expected length of stay: 10-14 days Cultural Considerations: [employer] Dietary Needs: Carb mod, med cal, thin liquids Equipment Needs: TBD Pt/Family Agrees to Admission and willing to participate: Yes Program Orientation Provided & Reviewed with Pt/Caregiver Including Roles  &  Responsibilities: Yes  Decrease burden of Care through IP rehab admission: N/A  Possible need for SNF placement upon discharge: No, patient has refused SNF placement and plans to return home after rehab.  Patient Condition: This patient's medical and functional status has changed since the consult dated: 09/02/15 in which the Rehabilitation Physician determined and documented that the patient's condition is appropriate for intensive rehabilitative care in an inpatient rehabilitation facility. See "History of Present Illness" (above) for medical update. Functional changes are:  Currently requiring min assist for transfers. Patient's medical and functional status update has been discussed with the Rehabilitation physician and patient remains appropriate for inpatient rehabilitation. Will admit to inpatient rehab today.  Preadmission Screen Completed By:  [name], 09/04/2015 12:35 PM ______________________________________________________________________   Discussed status with Dr. Letta Pate on 09/04/15 at 1234 and received telephone approval for admission today.  Admission Coordinator:  [name], time1234/Date08/31/17

## 2015-09-04 NOTE — Discharge Summary (Addendum)
Physician Discharge Summary   Patient ID: [name] MRN: [id number] DOB/AGE: 05-20-1965 50 y.o.  Admit date: 08/23/2015 Discharge date: 09/04/2015  Primary Care Physician:  [name], MD  Discharge Diagnoses:   . Acute on chronic renal failure ([place]) . Bilateral Hydronephrosis . Thrombocytopenia ([place]) . Pressure ulcer . Severe anemia . UTI (lower urinary tract infection) . Metabolic acidosis resolved . Hypertension . Chronic diabetic skin lesions . Hypocalcemia, hypomagnesemia, hypokalemia . Papular rash . Uncontrolled type 2 diabetes mellitus with complication ([place]) . Paresthesia   Consults:  Nephrology Urology Inpatient rehabilitation Oncology, Dr. Malachy Mood  Recommendations for Outpatient Follow-up:  1. Follow biopsy results of the chronic diabetic skin lesions 2. Please repeat CBC/BMET at next visit 3. Will need repeat imaging after postobstructive diuresis in a few weeks   DIET: Carb modified diet    Allergies:  No Known Allergies   DISCHARGE MEDICATIONS: Current Discharge Medication List    CONTINUE these medications which have NOT CHANGED   Details  aspirin 81 MG EC tablet Take 162-243 mg by mouth 3 (three) times daily as needed (chest pain). Swallow whole.    Aspirin-Acetaminophen-Caffeine (GOODY HEADACHE PO) Take 1 packet by mouth 2 (two) times daily as needed (headache).    Menthol, Topical Analgesic, (ICY HOT EX) Apply 1 application topically 2 (two) times daily as needed (pain).    Naphazoline HCl (CLEAR EYES OP) Place 1 drop into both eyes daily as needed (dry eyes/itching).      STOP taking these medications     naproxen sodium (ALEVE) 220 MG tablet      amLODipine (NORVASC) 10 MG tablet      insulin aspart (NOVOLOG) 100 UNIT/ML injection        Please continue inpatient medications Ferrous gluconate 125 mg IV every other day Calcium carbonate 400 mg TID Sliding scale insulin-resistant Zofran 4 mg IV every 4 hours as  needed Phenergan 25 mg every 6 hours as needed for refractory nausea and vomiting Trazodone 25 mg at bedtime when necessary sleep Metamucil one packet daily as needed for mild constipation   Brief H and P: For complete details please refer to admission H and P, but in brief 50 y.o.FHx Intracerebral hemorrhage, Stroke, DM2, S/P right BKA, and HTN who presented to the ED for weakness progressive over 4 weeks she's been becoming increasingly weak.  She had not seen her physician in a year. She had also been off her medications for 1 year.  Hospital Course:  Severe Anemia w/o evidence of acute blood loss  -Transfuse as needed to keep hemoglobin 7.0 or greater -Hematology, Dr [name] following is following with [place] - bone marrow appears healthy/without abnormality -IV iron and erythropoietin per nephrology, Etiology remains unclear apart from chronic kidney disease - Patient was closely followed by hematology, recommended to continue erythropoietin and iron, she is stable from hematology to standpoint. Hematology will follow up on PT and PTT mixing studies, she may have lupus anticoagulant. She will need close outpatient follow-up of hemoglobin and platelet count.  Thrombocytopenia -Hematology following -Bone Marrow Biopsy without abnormality -Transfuse as needed for platelets less than 10K or active bleeding. Hematology will follow PT and PTT mixing studies.  Acute on CKD -Cr 1.04 in 2013. [place] noted mod BL hydronephrosis in the setting of acute urinary retention. Foley catheter has since been placed and creatinine continues to slowly improve -follow-up imaging noted some persisting hydronephrosis despite Foley drained bladder. Patient is being followed by urology, continue Foley catheter. -Repeat renal  ultrasound when diuretic phase resolved in a few weeks. Patient will need follow-up appointment with urology and nephrology. 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  UTI  -has completed a course of levaquin - urine culture not helpful   Hypokalemia  -replaced  Hypomagnesemia -replaced  Hypocalcemia  -Now corrects into normal range  Papular Rash - Lichen simplex chronicus / prurigo nodularis -reportedly chronic, or at least subacute -Skin biopsy suggests above diagnosis, w/o evidence of vascular disease, calcphylaxis, or infection  -stop diflucan  HTN -Blood pressure trending upward - continue to follow without change in medical therapy today but may soon require titration of medication  DM2 uncontrolled  -S/P right BKA -A1c 5.3 - CBG controlled  Weakness / Numbness / Tingling- No focal deficit -Most likely secondary to chronically severely uncontrolled diabetes v/s profound anemia v/s hypocalcemia - much improved at this time   Day of Discharge BP (!) 163/82 (BP Location: Right Arm)   Pulse (!) 102   Temp 98.7 F (37.1 C) (Oral)   Resp 16   Ht '5\' 7"'  (1.702 m)   Wt 101 kg (222 lb 10.6 oz)   LMP  (LMP Unknown)   SpO2 98%   BMI 34.87 kg/m   Physical Exam: General: Alert and awake oriented x3 not in any acute distress. HEENT: anicteric sclera, pupils reactive to light and accommodation CVS: S1-S2 clear no murmur rubs or gallops Chest: clear to auscultation bilaterally, no wheezing rales or rhonchi Abdomen: soft nontender, nondistended, normal bowel sounds Extremities: Right BKA Skin: Multiple chronic lesions over arms, legs, torso Neuro: Cranial nerves II-XII intact, no focal neurological deficits   The results of significant diagnostics from this hospitalization (including imaging, microbiology, ancillary and laboratory) are listed below for reference.    LAB RESULTS: Basic Metabolic Panel:  Recent Labs Lab 09/02/15 0427 09/03/15 0358 09/04/15 0338  NA 140 143 138  K 4.6 4.6 4.8  CL 108 110 106  CO2 '25 24 23  ' GLUCOSE 99 93 157*  BUN  30* 25* 22*  CREATININE 2.35* 2.24* 2.05*  CALCIUM 7.9* 8.5* 8.8*  MG 2.1  --   --   PHOS 3.6 3.5 3.4   Liver Function Tests:  Recent Labs Lab 09/03/15 0358 09/04/15 0338  ALBUMIN 2.5* 2.8*   No results for input(s): LIPASE, AMYLASE in the last 168 hours. No results for input(s): AMMONIA in the last 168 hours. CBC:  Recent Labs Lab 09/02/15 0427 09/04/15 0336  WBC 6.6 7.3  NEUTROABS 4.7  --   HGB 7.1* 7.9*  HCT 23.1* 25.8*  MCV 89.9 89.0  PLT 91* 102*   Cardiac Enzymes: No results for input(s): CKTOTAL, CKMB, CKMBINDEX, TROPONINI in the last 168 hours. BNP: Invalid input(s): POCBNP CBG:  Recent Labs Lab 09/03/15 2134 09/04/15 0831  GLUCAP 122* 165*    Significant Diagnostic Studies:  Dg Chest 2 View  Result Date: 08/23/2015 CLINICAL DATA:  Cough, generalized weakness EXAM: CHEST  2 VIEW COMPARISON:  07/07/2011 FINDINGS: Significant cardiomegaly is noted. No infiltrate or pulmonary edema. There is small left pleural effusion. Bilateral basilar atelectasis. IMPRESSION: Significant cardiomegaly. Small left pleural effusion. Bilateral basilar atelectasis. No infiltrate or convincing pulmonary edema. Electronically Signed   By: Lahoma Crocker M.D.   On: 08/23/2015 20:19   US Renal  Result Date: 08/24/2015 CLINICAL DATA:  Acute onset of renal insufficiency. Initial encounter. EXAM: RENAL / URINARY TRACT ULTRASOUND COMPLETE  COMPARISON:  None. FINDINGS: Right Kidney: Length: 12.8 cm. Echogenicity within normal limits. Moderate right-sided hydronephrosis is noted. No mass visualized. Left Kidney: Length: 12.2 cm. Echogenicity within normal limits. Moderate left-sided hydronephrosis is noted. No mass visualized. Bladder: Diffusely distended; the patient is unable to void. IMPRESSION: Moderate bilateral hydronephrosis. The bladder is distended, and the patient is unable to void. This could reflect bladder outlet obstruction. Bilateral ureteral obstruction cannot be excluded on the  basis of this study. Noncontrast CT could be considered for further evaluation, as deemed clinically appropriate. Electronically Signed   By: Garald Balding M.D.   On: 08/24/2015 00:39    2D ECHO:   Disposition and Follow-up:    DISPOSITION: Inpatient rehabilitation   [facility] Follow up on 10/14/2015.   Why:  9 am for hospital follow up ( sickle cell clinic taking over flow for [facility]) Contact information: [place] [address]       FULP, CAMMIE, MD. Schedule an appointment as soon as possible for a visit in 2 week(s).   Specialty:  Family Medicine Contact information: [address] [address] [phone]        Ardis Hughs, MD. Schedule an appointment as soon as possible for a visit in 2 week(s).   Specialty:  Urology Contact information: [place] [address] [phone]        Betsy Coder, MD. Schedule an appointment as soon as possible for a visit in 2 week(s).   Specialty:  Oncology Contact information: [place] [address] [phone]            Time spent on Discharge: 40 minutes   Signed:   Galena Logie M.D. [facility] 09/04/2015, 11:17 AM Pager: [phone]   Coding query  Acute kidney injury on CKD stage III     Novie Maggio M.D. [facility] 09/05/2015, 7:51 PM  Pager: [phone]

## 2015-09-04 NOTE — H&P (View-Only) (Signed)
Physical Medicine and Rehabilitation Admission H&P       Chief Complaint  Patient presents with  . Debility in patient with severe anemia and R-BKA in setting of peripheral neuropathy    HPI:   [name] a 50 y.o.femalewith history of T2DM with neuropathy, ICH, diabetic foot ulcer s/p R-BKA 7/13, medication non-compliance for past year who was admitted on 08/23/15 with malaise, inability to walk, dyspnea and numbness of her hands. She was found to have multiple skin lesions with metabolic acidosis, severe normocytic-normochromic anemia--hgb 4.8 and thrombocytopenia - 10. She was found to have acute renal failure and renal ultrasound showed normal kidneys with distended bladder and bilateral hydronephrosis. She was transfused with 2 units PRBC and one unit platelets and started on Levaquin for UTI. Dr [name] consulted for work up and recommended skin biopsy as well as autoimmune studies. Bone marrow biopsy negative and skin bx path pending. Dr. Justin Mend consulted for input and felt that assumed ARF in setting of bilateral hydronephrosis and BOO. Urine output improved with foley and SCr on downward trend. He felt lesions likely chronic diabetic lesions and recommended GUconsult.   Repeat ultrasound 8/27 showed moderate bilateral hydronephrosis.   CT abdomen/pelvis done due to concerns of rectovaginal fistula. This showed diffuse anasarca, no evidence of fistula and moderately enlarged bilateral inguinal lymph nodes.   Dr.Herrick evaluated patient and felt that urinary retention has been chronic leading to ureteral reflux and hydronephrosis. He felt that hydronephrosis had improved with diuresis/foley and that ureteral stents not recommended. Hydronephrosis should improve over next few weeks with diuresis--to repeat voiding trial and to educate patient on in and out caths v/s indwelling foley. She continues to have impressive UOP with downward trend in Scr- 2.05 and weight to 101 kg.   Skin Biopsy revealed epidermal hyperplasia with fibrosing granulation tissue.  She continues to have persistent coagulopathy--PT/PTT mixing studies pending.  Heme/Onc recommends erythropoietin/iron for anemia and questions presence of lupus anticoagulant.  To transfuse as needed for platelets less than 10K.  Patient noted to be debilitated and CIR recommended for follow up therapy.   Patient states she had a stroke many years ago which affected her vision in the right eye lower quadrant   Review of Systems  Constitutional: Positive for malaise/fatigue. Negative for fever.  HENT: Negative for hearing loss.   Eyes: Positive for blurred vision.  Respiratory: Negative for cough and hemoptysis.   Cardiovascular: Negative for chest pain and palpitations.  Gastrointestinal: Positive for nausea (ongoing for past couple of months but worse since yesterday) and vomiting. Negative for abdominal pain, constipation and heartburn.  Musculoskeletal: Positive for myalgias.  Skin: Positive for rash. Negative for itching.  Neurological: Positive for sensory change (diffuse numbness LLE for past year.  Numbness bilateral hands for couple of weeks) and weakness. Negative for speech change, focal weakness and headaches.  Psychiatric/Behavioral: Negative for depression. The patient does not have insomnia.          Past Medical History:  Diagnosis Date  . Arthritis   . Diabetes mellitus   . Diabetic foot ulcer associated with type 2 diabetes mellitus ([place])   . Hypertension   . Intracerebral hemorrhage ([place]) As a teenager    States she had burst blood vessel as teenager, now with resultant minor visual field and hearing deficits  . Neuropathy ([place])   . Stroke [facility])          Past Surgical History:  Procedure Laterality Date  . AMPUTATION  11/24/2010  Procedure: AMPUTATION FOOT;  Surgeon: Wylene Simmer, MD;  Location: [place];  Service: Orthopedics;  Laterality: Right;  Right  FOOT TRANS  METATARSAL AMPUTATION  . AMPUTATION  07/02/2011   Procedure: AMPUTATION BELOW KNEE;  Surgeon: Wylene Simmer, MD;  Location: [place];  Service: Orthopedics;  Laterality: Right;  . APPLICATION OF WOUND VAC  07/02/2011   Procedure: APPLICATION OF WOUND VAC;  Surgeon: Wylene Simmer, MD;  Location: [place];  Service: Orthopedics;  Laterality: Right;  . BRAIN SURGERY  1980's   Previous brain surgery in [place], [place]  . CESAREAN SECTION    . I&D EXTREMITY  11/24/2010   Procedure: IRRIGATION AND DEBRIDEMENT EXTREMITY;  Surgeon: Wylene Simmer, MD;  Location: [place];  Service: Orthopedics;  Laterality: Left;  Debriedment of left plantar ulcer and trimming of toenails  . I&D EXTREMITY  07/02/2011   Procedure: IRRIGATION AND DEBRIDEMENT EXTREMITY;  Surgeon: Wylene Simmer, MD;  Location: [place];  Service: Orthopedics;  Laterality: Left;  I & D of Left Foot  . I&D EXTREMITY  07/06/2011   Procedure: IRRIGATION AND DEBRIDEMENT EXTREMITY;  Surgeon: Wylene Simmer, MD;  Location: [place];  Service: Orthopedics;  Laterality: Right;  IRRIGATION/DEBRIDEMENT RIGHT BELOW KNEE AMPUTATION          Family History  Problem Relation Age of Onset  . Diabetes Mother   . Hypertension Mother   . Hyperlipidemia Mother   . Diabetes Father   . Cancer Father     prostate  . Hyperlipidemia Father   . Hypertension Father   . Diabetes Brother   . Peripheral Artery Disease Brother     has had toes amputated    Social History:  Lives with two children--mother from [place] with children at this time. Independent at wheelchair level. reports that she has never smoked. She has never used smokeless tobacco. She reports that she does not drink alcohol or use drugs.   Allergies: No Known Allergies          Medications Prior to Admission  Medication Sig Dispense Refill  . aspirin 81 MG EC tablet Take 162-243 mg by mouth 3 (three) times daily as needed (chest pain). Swallow whole.    .  Aspirin-Acetaminophen-Caffeine (GOODY HEADACHE PO) Take 1 packet by mouth 2 (two) times daily as needed (headache).    . Menthol, Topical Analgesic, (ICY HOT EX) Apply 1 application topically 2 (two) times daily as needed (pain).    . Naphazoline HCl (CLEAR EYES OP) Place 1 drop into both eyes daily as needed (dry eyes/itching).    . naproxen sodium (ALEVE) 220 MG tablet Take 440 mg by mouth 2 (two) times daily as needed (pain).    Marland Kitchen amLODipine (NORVASC) 10 MG tablet Take 1 tablet (10 mg total) by mouth daily. 30 tablet 0  . insulin aspart (NOVOLOG) 100 UNIT/ML injection Inject 3 Units into the skin 3 (three) times daily with meals. 1 vial 0    Home: Home Living Family/patient expects to be discharged to:: Private residence Living Arrangements: Children Available Help at Discharge: Family Type of Home: House Home Access: Stairs to enter Technical brewer of Steps: 1 Entrance Stairs-Rails: None Home Layout: Two level, Able to live on main level with bedroom/bathroom Alternate Level Stairs-Number of Steps: 14 Alternate Level Stairs-Rails: Left Bathroom Shower/Tub: Chiropodist: Standard Home Equipment: Environmental consultant - 2 wheels, Wheelchair - manual (reports getting ) Additional Comments: pt reports hopping with RW PTA   Functional History: Prior Function Level of Independence:  Independent with assistive device(s) Comments: Patient does not use her prosthesis.  Uses w/c for mobility, and walker for bathroom.  Uses cabs for transportation, but having trouble getting into/out of cab with weakness.  Functional Status:  Mobility: Bed Mobility Overal bed mobility: Needs Assistance Bed Mobility: Rolling, Supine to Sit, Sit to Supine Rolling: Supervision Supine to sit: Mod assist Sit to supine: Mod assist General bed mobility comments: up in recliner Transfers Overall transfer level: Needs assistance Equipment used: Rolling walker (2 wheeled) Transfer via  Lift Equipment: Stedy Transfers: Sit to/from [employer] to Stand: Min assist Stand pivot transfers: +2 physical assistance, Max assist (Use of Denna Haggard) General transfer comment: pt with L LE blocked and able to compelte sit<>Stand. pt unable to sustain static standing      ADL: ADL Overall ADL's : Needs assistance/impaired Grooming: Wash/dry hands, Set up Upper Body Bathing: Minimal assitance Lower Body Bathing: Maximal assistance Toilet Transfer Details (indicate cue type and reason): pt (A) to place on bed pan by tech prior to session. pt long sitting on pan in bed General ADL Comments: Upon initiating sitting up for self care pt with increase nausea and needed to return to supine.  Cognition: Cognition Overall Cognitive Status: Within Functional Limits for tasks assessed Orientation Level: Oriented X4 Cognition Arousal/Alertness: Awake/alert Behavior During Therapy: WFL for tasks assessed/performed Overall Cognitive Status: Within Functional Limits for tasks assessed Area of Impairment: Following commands Following Commands: Follows multi-step commands inconsistently, Follows multi-step commands with increased time General Comments: Pt needed cues during session when provided two step commands   Blood pressure (!) 163/82, pulse (!) 102, temperature 98.7 F (37.1 C), temperature source Oral, resp. rate 16, height '5\' 7"'  (1.702 m), weight 101 kg (222 lb 10.6 oz), SpO2 98 %. Physical Exam  Lab Results Last 48 Hours        Results for orders placed or performed during the hospital encounter of 08/23/15 (from the past 48 hour(s))  Glucose, capillary     Status: Abnormal   Collection Time: 09/02/15 12:08 PM  Result Value Ref Range   Glucose-Capillary 127 (H) 65 - 99 mg/dL  Glucose, capillary     Status: Abnormal   Collection Time: 09/02/15  1:39 PM  Result Value Ref Range   Glucose-Capillary 140 (H) 65 - 99 mg/dL   Comment 1 Notify RN    Comment 2 Document in  Chart   Glucose, capillary     Status: Abnormal   Collection Time: 09/02/15  4:19 PM  Result Value Ref Range   Glucose-Capillary 141 (H) 65 - 99 mg/dL   Comment 1 Notify RN    Comment 2 Document in Chart   Glucose, capillary     Status: Abnormal   Collection Time: 09/02/15  9:14 PM  Result Value Ref Range   Glucose-Capillary 147 (H) 65 - 99 mg/dL  Renal function panel     Status: Abnormal   Collection Time: 09/03/15  3:58 AM  Result Value Ref Range   Sodium 143 135 - 145 mmol/L   Potassium 4.6 3.5 - 5.1 mmol/L   Chloride 110 101 - 111 mmol/L   CO2 24 22 - 32 mmol/L   Glucose, Bld 93 65 - 99 mg/dL   BUN 25 (H) 6 - 20 mg/dL   Creatinine, Ser 2.24 (H) 0.44 - 1.00 mg/dL   Calcium 8.5 (L) 8.9 - 10.3 mg/dL   Phosphorus 3.5 2.5 - 4.6 mg/dL   Albumin 2.5 (L) 3.5 - 5.0 g/dL  GFR calc non Af Amer 25 (L) >60 mL/min   GFR calc Af Amer 29 (L) >60 mL/min    Comment: (NOTE) The eGFR has been calculated using the CKD EPI equation. This calculation has not been validated in all clinical situations. eGFR's persistently <60 mL/min signify possible Chronic Kidney Disease.   Anion gap 9 5 - 15  Lactate dehydrogenase     Status: None   Collection Time: 09/03/15  3:59 AM  Result Value Ref Range   LDH 168 98 - 192 U/L  Glucose, capillary     Status: None   Collection Time: 09/03/15  8:52 AM  Result Value Ref Range   Glucose-Capillary 99 65 - 99 mg/dL   Comment 1 Notify RN    Comment 2 Document in Chart   Glucose, capillary     Status: Abnormal   Collection Time: 09/03/15 12:31 PM  Result Value Ref Range   Glucose-Capillary 118 (H) 65 - 99 mg/dL   Comment 1 Notify RN    Comment 2 Document in Chart   Glucose, capillary     Status: Abnormal   Collection Time: 09/03/15  5:22 PM  Result Value Ref Range   Glucose-Capillary 106 (H) 65 - 99 mg/dL   Comment 1 Notify RN    Comment 2 Document in Chart   Glucose, capillary     Status: Abnormal    Collection Time: 09/03/15  9:34 PM  Result Value Ref Range   Glucose-Capillary 122 (H) 65 - 99 mg/dL  CBC     Status: Abnormal   Collection Time: 09/04/15  3:36 AM  Result Value Ref Range   WBC 7.3 4.0 - 10.5 K/uL   RBC 2.90 (L) 3.87 - 5.11 MIL/uL   Hemoglobin 7.9 (L) 12.0 - 15.0 g/dL   HCT 25.8 (L) 36.0 - 46.0 %   MCV 89.0 78.0 - 100.0 fL   MCH 27.2 26.0 - 34.0 pg   MCHC 30.6 30.0 - 36.0 g/dL   RDW 16.3 (H) 11.5 - 15.5 %   Platelets 102 (L) 150 - 400 K/uL    Comment: CONSISTENT WITH PREVIOUS RESULT  Renal function panel     Status: Abnormal   Collection Time: 09/04/15  3:38 AM  Result Value Ref Range   Sodium 138 135 - 145 mmol/L   Potassium 4.8 3.5 - 5.1 mmol/L   Chloride 106 101 - 111 mmol/L   CO2 23 22 - 32 mmol/L   Glucose, Bld 157 (H) 65 - 99 mg/dL   BUN 22 (H) 6 - 20 mg/dL   Creatinine, Ser 2.05 (H) 0.44 - 1.00 mg/dL   Calcium 8.8 (L) 8.9 - 10.3 mg/dL   Phosphorus 3.4 2.5 - 4.6 mg/dL   Albumin 2.8 (L) 3.5 - 5.0 g/dL   GFR calc non Af Amer 28 (L) >60 mL/min   GFR calc Af Amer 32 (L) >60 mL/min    Comment: (NOTE) The eGFR has been calculated using the CKD EPI equation. This calculation has not been validated in all clinical situations. eGFR's persistently <60 mL/min signify possible Chronic Kidney Disease.   Anion gap 9 5 - 15  Glucose, capillary     Status: Abnormal   Collection Time: 09/04/15  8:31 AM  Result Value Ref Range   Glucose-Capillary 165 (H) 65 - 99 mg/dL     Imaging Results (Last 48 hours)  No results found.       Medical Problem List and Plan: 1.  Deconditioning secondary to multiple medical  issues including severe anemia, acute renal failure 2.  DVT Prophylaxis/Anticoagulation: Mechanical: Sequential compression devices, below knee Bilateral lower extremities 3. Pain Management: Norco prn for chronic back pain 4. Mood: Team to provide ego support. LCSW to follow for evaluation and support.  5. Neuropsych:  This patient is capable of making decisions on her own behalf. 6. Skin/Wound Care: Aquacel Silver for MASD gluteal cleft.  7. Fluids/Electrolytes/Nutrition:  Will schedule compazine before meals for ongoing nausea--question gastroparesis. Discussed food choices to help with intake/nausea. Will consult dietician for education. 10 T2DM with neuropathy, nephropathy, vasculopathy and question gastroparesis: Monitor BS ac/hs. Will use SSI for elevated BS.   11. CKD: Avoid NSAIDs and other nephrotoxic drugs. On gentle hydration --continue foley for strict I/O and ongoing diuresis.  Hypocalcemia managed with tums. Monitor weights daily.  12. Bilateral hydronephrosis due to chronic urinary retention:  Foley for now as output about 4000 cc last 24 hours. Voiding trial as output decreases.  13. Anemia of chronic disease: In IV iron for supplement--received dose of Aransep on 8/29.  14. Thrombocytopenia: Monitor with serial checks--currently 102,000 Transfuse prn < 10K    Post Admission Physician Evaluation: 1. Functional deficits secondary  to multiple medical issues including severe anemia, acute renal failure. 2. Patient is admitted to receive collaborative, interdisciplinary care between the physiatrist, rehab nursing staff, and therapy team. 3. Patient's level of medical complexity and substantial therapy needs in context of that medical necessity cannot be provided at a lesser intensity of care such as a SNF. 4. Patient has experienced substantial functional loss from his/her baseline which was documented above under the "Functional History" and "Functional Status" headings.  Judging by the patient's diagnosis, physical exam, and functional history, the patient has potential for functional progress which will result in measurable gains while on inpatient rehab.  These gains will be of substantial and practical use upon discharge  in facilitating mobility and self-care at the household  level. 5. Physiatrist will provide 24 hour management of medical needs as well as oversight of the therapy plan/treatment and provide guidance as appropriate regarding the interaction of the two. 6. 24 hour rehab nursing will assist with bladder management, bowel management, safety, skin/wound care, disease management, medication administration, pain management and patient education  and help integrate therapy concepts, techniques,education, etc. 7. PT will assess and treat for/with: pre gait, gait training, endurance , safety, equipment, neuromuscular re education.   Goals are: Supervision. 8. OT will assess and treat for/with: ADLs, Cognitive perceptual skills, Neuromuscular re education, safety, endurance, equipment.   Goals are: Supervision to modified independent. Therapy may proceed with showering this patient. 9. SLP will assess and treat for/with: NA.  Goals are: NA. 10. Case Management and Social Worker will assess and treat for psychological issues and discharge planning. 11. Team conference will be held weekly to assess progress toward goals and to determine barriers to discharge. 12. Patient will receive at least 3 hours of therapy per day at least 5 days per week. 13. ELOS: 16-19 days       14. Prognosis:  excellent     Charlett Blake M.D. [facility] FAAPM&R (Sports Med, Neuromuscular Med) Diplomate Am Board of Electrodiagnostic Med  09/04/2015

## 2015-09-04 NOTE — Progress Notes (Signed)
Occupational Therapy Treatment Patient Details Name: Earvin HansenMichelle L Koslosky MRN: [id number] DOB: 02/28/1965 Today's Date: 09/04/2015    History of present illness Patient is a 50 yo female admitted 08/23/15 with general weakness, severe anemia (Hgb 4.2), UTI, AKI, skin lesions.    PMH:  DM, neuropathy, h/o Rt BKA, HTN, ARF, anxiety, ICH, CVA   OT comments  Rehab today  Follow Up Recommendations  CIR          Precautions / Restrictions Precautions Precautions: Fall Precaution Comments: R BKA 2013 Restrictions Weight Bearing Restrictions: No       Mobility Bed Mobility Overal bed mobility: Needs Assistance Bed Mobility: Rolling;Supine to Sit;Sit to Supine Rolling: Supervision   Supine to sit: Mod assist Sit to supine: Mod assist      Transfers                        ADL                                         General ADL Comments: Upon initiating sitting up for self care pt with increase nausea and needed to return to supine.      Vision                            Cognition   Behavior During Therapy: WFL for tasks assessed/performed Overall Cognitive Status: Within Functional Limits for tasks assessed                                                       Pertinent Vitals/ Pain       Pain Assessment: No/denies pain         Frequency       Progress Toward Goals  OT Goals(current goals can now be found in the care plan section)  Progress towards OT goals: Progressing toward goals     Plan Discharge plan remains appropriate       End of Session     Activity Tolerance Other (comment) (limited by nausea)   Patient Left in bed;with call bell/phone within reach;with nursing/sitter in room           Time: [id number] OT Time Calculation (min): 15 min  Charges: OT General Charges $OT Visit: 1 Procedure OT Treatments $Self Care/Home Management : 8-22 mins  Jahmia Berrett D 09/04/2015,  11:27 AM

## 2015-09-04 NOTE — Interval H&P Note (Signed)
Cheryl HansenMichelle L Wolf was admitted today to Inpatient Rehabilitation with the diagnosis of debility.  The patient's history has been reviewed, patient examined, and there is no change in status.  Patient continues to be appropriate for intensive inpatient rehabilitation.  I have reviewed the patient's chart and labs.  Questions were answered to the patient's satisfaction. The PAPE has been reviewed and assessment remains appropriate.  Ankit Karis Jubanil Patel 09/04/2015, 10:49 PM

## 2015-09-05 ENCOUNTER — Inpatient Hospital Stay (HOSPITAL_COMMUNITY): Payer: Medicaid Other | Admitting: Physical Therapy

## 2015-09-05 ENCOUNTER — Inpatient Hospital Stay (HOSPITAL_COMMUNITY): Payer: Medicaid Other | Admitting: Occupational Therapy

## 2015-09-05 DIAGNOSIS — D638 Anemia in other chronic diseases classified elsewhere: Secondary | ICD-10-CM

## 2015-09-05 DIAGNOSIS — I999 Unspecified disorder of circulatory system: Secondary | ICD-10-CM

## 2015-09-05 DIAGNOSIS — E1142 Type 2 diabetes mellitus with diabetic polyneuropathy: Secondary | ICD-10-CM

## 2015-09-05 DIAGNOSIS — N1330 Unspecified hydronephrosis: Secondary | ICD-10-CM

## 2015-09-05 DIAGNOSIS — K5901 Slow transit constipation: Secondary | ICD-10-CM

## 2015-09-05 DIAGNOSIS — N1339 Other hydronephrosis: Secondary | ICD-10-CM

## 2015-09-05 DIAGNOSIS — N183 Chronic kidney disease, stage 3 (moderate): Secondary | ICD-10-CM

## 2015-09-05 DIAGNOSIS — E1165 Type 2 diabetes mellitus with hyperglycemia: Secondary | ICD-10-CM

## 2015-09-05 DIAGNOSIS — E1121 Type 2 diabetes mellitus with diabetic nephropathy: Secondary | ICD-10-CM

## 2015-09-05 DIAGNOSIS — N189 Chronic kidney disease, unspecified: Secondary | ICD-10-CM

## 2015-09-05 DIAGNOSIS — D696 Thrombocytopenia, unspecified: Secondary | ICD-10-CM

## 2015-09-05 DIAGNOSIS — IMO0002 Reserved for concepts with insufficient information to code with codable children: Secondary | ICD-10-CM | POA: Insufficient documentation

## 2015-09-05 DIAGNOSIS — R339 Retention of urine, unspecified: Secondary | ICD-10-CM

## 2015-09-05 LAB — COMPREHENSIVE METABOLIC PANEL
ALT: 17 U/L (ref 14–54)
AST: 21 U/L (ref 15–41)
Albumin: 3 g/dL — ABNORMAL LOW (ref 3.5–5.0)
Alkaline Phosphatase: 76 U/L (ref 38–126)
Anion gap: 7 (ref 5–15)
BUN: 15 mg/dL (ref 6–20)
CO2: 25 mmol/L (ref 22–32)
Calcium: 9.1 mg/dL (ref 8.9–10.3)
Chloride: 104 mmol/L (ref 101–111)
Creatinine, Ser: 1.79 mg/dL — ABNORMAL HIGH (ref 0.44–1.00)
GFR calc Af Amer: 38 mL/min — ABNORMAL LOW (ref >60)
GFR calc non Af Amer: 32 mL/min — ABNORMAL LOW (ref >60)
Glucose, Bld: 141 mg/dL — ABNORMAL HIGH (ref 65–99)
Potassium: 3.9 mmol/L (ref 3.5–5.1)
Sodium: 136 mmol/L (ref 135–145)
Total Bilirubin: 0.4 mg/dL (ref 0.3–1.2)
Total Protein: 7.3 g/dL (ref 6.5–8.1)

## 2015-09-05 LAB — PTT FACTOR INHIBITOR (MIXING STUDY)
aPTT 1:1 Mix Saline: 33.7 s
aPTT 1:1 Normal Plasma: 25.2 s (ref 22.9–30.2)
aPTT: 27.4 s (ref 22.9–30.2)

## 2015-09-05 LAB — CBC WITH DIFFERENTIAL/PLATELET
Basophils Absolute: 0 10*3/uL (ref 0.0–0.1)
Basophils Relative: 0 %
Eosinophils Absolute: 0 10*3/uL (ref 0.0–0.7)
Eosinophils Relative: 0 %
HCT: 29.2 % — ABNORMAL LOW (ref 36.0–46.0)
Hemoglobin: 8.9 g/dL — ABNORMAL LOW (ref 12.0–15.0)
Lymphocytes Relative: 13 %
Lymphs Abs: 1 10*3/uL (ref 0.7–4.0)
MCH: 27.1 pg (ref 26.0–34.0)
MCHC: 30.5 g/dL (ref 30.0–36.0)
MCV: 89 fL (ref 78.0–100.0)
Monocytes Absolute: 0.3 10*3/uL (ref 0.1–1.0)
Monocytes Relative: 4 %
Neutro Abs: 6.4 10*3/uL (ref 1.7–7.7)
Neutrophils Relative %: 83 %
Platelets: 148 10*3/uL — ABNORMAL LOW (ref 150–400)
RBC: 3.28 MIL/uL — ABNORMAL LOW (ref 3.87–5.11)
RDW: 15.8 % — ABNORMAL HIGH (ref 11.5–15.5)
WBC: 7.8 10*3/uL (ref 4.0–10.5)

## 2015-09-05 LAB — GLUCOSE, CAPILLARY
Glucose-Capillary: 141 mg/dL — ABNORMAL HIGH (ref 65–99)
Glucose-Capillary: 111 mg/dL — ABNORMAL HIGH (ref 65–99)
Glucose-Capillary: 130 mg/dL — ABNORMAL HIGH (ref 65–99)
Glucose-Capillary: 108 mg/dL — ABNORMAL HIGH (ref 65–99)

## 2015-09-05 LAB — PT FACTOR INHIBITOR (MIXING STUDY)
PT 1:1NP: 10.4 s (ref 9.6–11.5)
PT: 11.2 s (ref 9.6–11.5)

## 2015-09-05 MED ORDER — SENNOSIDES-DOCUSATE SODIUM 8.6-50 MG PO TABS
2.0000 | ORAL_TABLET | Freq: Two times a day (BID) | ORAL | Status: DC
  Administered 2015-09-05 – 2015-09-09 (×4): 2 via ORAL
  Filled 2015-09-05 (×11): qty 2

## 2015-09-05 NOTE — Progress Notes (Signed)
[name], RN Rehab Admission Coordinator Signed Physical Medicine and Rehabilitation  PMR Pre-admission Date of Service: 09/04/2015 12:23 PM  Related encounter: ED to Hosp-Admission (Discharged) from 08/23/2015 in [place]       _0 [facility] copied text PMR Admission Coordinator Pre-Admission Assessment  Patient: [name] is an 50 y.o., female MRN: [id number] DOB: 01-01-1966 Height: _1  (170.2 cm) Weight: 101 kg (222 lb 10.6 oz)                                                                                                                                                                                                                                                                          Insurance Information Self pay - no insurance  Medicaid Application Date:        Case Manager:   Disability Application Date:        Case Worker:    Emergency Tax adviser Information    Name Relation Home Work Mobile   Glass,Patsy Mother [phone]  [phone]     Current Medical History  Patient Admitting Diagnosis:  Debility, anemia, old R BKA  History of Present Illness: A 50 y.o.femalewith history of T2DM with neuropathy, ICH, diabetic foot ulcer s/p R-BKA 7/13, medication non-compliance for past year who was admitted on 08/23/15 with malaise, inability to walk, dyspnea and numbness of her hands. She was found to have multiple skin lesions with metabolic acidosis, severe normocytic-normochromic anemia--hgb 4.8 and thrombocytopenia - 10. She was found to have acute renal failure and renal ultrasound showed normal kidneys with distended bladder and bilateral hydronephrosis. She was transfused with 2 units PRBC and one unit platelets and started on Levaquin for UTI. Dr [name] consulted for work up and recommended skin biopsy as well as autoimmune studies. Bone marrow biopsy negative and skin bx path pending. Dr.  Justin Mend consulted for input and felt that assumed ARF in setting of bilateral hydronephrosis and BOO. Urine output improved with foley and SCr on downward trend. He felt lesions likely chronic diabetic lesions and recommendedGUconsult. Repeat ultrasound 8/27 showed moderate bilateral hydronephrosis. CT abdomen/pelvis done due to concerns of rectovaginal fistula. This showed diffuse  anasarca, no evidence of fistula and moderately enlarged bilateral inguinal lymph nodes.   Dr.Herrick evaluated patient and felt that urinary retention has been chronic leading to ureteral reflux and hydronephrosis. He felt that hydronephrosis had improved with diuresis/foley and that ureteral stents not recommended. Hydronephrosis should improve over next few weeks with diuresis--to repeat voiding trial and to educate patient on in and out caths v/s indwelling foley. She continues to have impressive UOP with downward trend in Scr- 2.05 and weight to 101 kg. Skin Biopsy revealed epidermal hyperplasia with fibrosing granulation tissue. She continues to have persistent coagulopathy--PT/PTT mixing studies pending. Heme/Onc recommends erythropoietin/iron for anemia and questions presence of lupus anticoagulant. To transfuse as needed for platelets less than 10K. Patient noted to be debilitated and CIR recommended for follow up therapy.    Total: 5=NIH  Past Medical History      Past Medical History:  Diagnosis Date  . Arthritis   . Diabetes mellitus   . Diabetic foot ulcer associated with type 2 diabetes mellitus ([place])   . Hypertension   . Intracerebral hemorrhage ([place]) As a teenager    States she had burst blood vessel as teenager, now with resultant minor visual field and hearing deficits  . Neuropathy ([place])   . Stroke [facility])     Family History  family history includes Cancer in her father; Diabetes in her brother, father, and mother; Hyperlipidemia in her father and mother; Hypertension in her father  and mother; Peripheral Artery Disease in her brother.  Prior Rehab/Hospitalizations: Was on CIR 2013.  Has the patient had major surgery during 100 days prior to admission? No  Current Medications   Current Facility-Administered Medications:  .  0.45 % sodium chloride infusion, , Intravenous, Continuous, Fleet Contras, MD, Last Rate: 50 mL/hr at 09/03/15 1325 .  acetaminophen (TYLENOL) tablet 650 mg, 650 mg, Oral, Q6H PRN, 650 mg at 09/02/15 0844 **OR** acetaminophen (TYLENOL) suppository 650 mg, 650 mg, Rectal, Q6H PRN, Elwin Mocha, MD .  calcium carbonate (TUMS - dosed in mg elemental calcium) chewable tablet 400 mg of elemental calcium, 2 tablet, Oral, TID, Fleet Contras, MD, 400 mg of elemental calcium at 09/04/15 0852 .  ferric gluconate (NULECIT) 125 mg in sodium chloride 0.9 % 100 mL IVPB, 125 mg, Intravenous, QODAY, Madelon Lips, MD, 125 mg at 09/02/15 1234 .  hydrALAZINE (APRESOLINE) injection 10 mg, 10 mg, Intravenous, Q8H PRN, Elwin Mocha, MD .  HYDROcodone-acetaminophen (NORCO/VICODIN) 5-325 MG per tablet 1 tablet, 1 tablet, Oral, Q4H PRN, Ladell Pier, MD, 1 tablet at 08/31/15 2244 .  insulin aspart (novoLOG) injection 0-20 Units, 0-20 Units, Subcutaneous, TID WC, Javier Glazier, MD, 4 Units at 09/04/15 [phone] .  insulin aspart (novoLOG) injection 0-5 Units, 0-5 Units, Subcutaneous, QHS, Javier Glazier, MD .  lidocaine (XYLOCAINE) 2 % injection 0-20 mL, 0-20 mL, Intradermal, Once PRN, Lisette Abu, PA-C .  MEDLINE mouth rinse, 15 mL, Mouth Rinse, BID, Allie Bossier, MD, 15 mL at 09/04/15 1000 .  ondansetron (ZOFRAN) tablet 4 mg, 4 mg, Oral, Q4H PRN **OR** ondansetron (ZOFRAN) injection 4 mg, 4 mg, Intravenous, Q4H PRN, Ripudeep K Rai, MD .  promethazine (PHENERGAN) injection 25 mg, 25 mg, Intravenous, Q6H PRN, Ripudeep K Rai, MD .  psyllium (HYDROCIL/METAMUCIL) packet 1 packet, 1 packet, Oral, Daily PRN, Allie Bossier, MD .  traZODone (DESYREL)  tablet 25 mg, 25 mg, Oral, QHS PRN, Elwin Mocha, MD, 25 mg at 08/31/15 2244  Patients Current  Diet: Diet Carb Modified Fluid consistency: Thin; Room service appropriate? Yes  Precautions / Restrictions Precautions Precautions: Fall Precaution Comments: R BKA 2013 Restrictions Weight Bearing Restrictions: No   Has the patient had 2 or more falls or a fall with injury in the past year?No  Prior Activity Level Limited Community (1-2x/wk): Went out 2-3 X a week, not driving, takes a cab if she needs transportation.  Home Assistive Devices / Equipment Home Assistive Devices/Equipment: Environmental consultant (specify type), Wheelchair (front wheel walker) Home Equipment: Walker - 2 wheels, Wheelchair - manual (reports getting )  Prior Device Use: Indicate devices/aids used by the patient prior to current illness, exacerbation or injury? Manual wheelchair and Walker  Prior Functional Level Prior Function Level of Independence: Independent with assistive device(s) Comments: Patient does not use her prosthesis.  Uses w/c for mobility, and walker for bathroom.  Uses cabs for transportation, but having trouble getting into/out of cab with weakness.  Self Care: Did the patient need help bathing, dressing, using the toilet or eating?  Independent  Indoor Mobility: Did the patient need assistance with walking from room to room (with or without device)? Independent  Stairs: Did the patient need assistance with internal or external stairs (with or without device)? Independent  Functional Cognition: Did the patient need help planning regular tasks such as shopping or remembering to take medications? Independent  Current Functional Level Cognition Overall Cognitive Status: Within Functional Limits for tasks assessed Orientation Level: Oriented X4 Following Commands: Follows multi-step commands inconsistently, Follows multi-step commands with increased time General Comments: Pt needed cues  during session when provided two step commands    Extremity Assessment (includes Sensation/Coordination) Upper Extremity Assessment: Generalized weakness  Lower Extremity Assessment: Defer to PT evaluation RLE Deficits / Details: BKA.  Able to move against gravity LLE Deficits / Details: Strength grossly 3-/5;  Reports new numbness in foot. LLE Sensation: history of peripheral neuropathy   ADLs Overall ADL's : Needs assistance/impaired Grooming: Wash/dry hands, Set up Upper Body Bathing: Minimal assitance Lower Body Bathing: Maximal assistance Toilet Transfer Details (indicate cue type and reason): pt (A) to place on bed pan by tech prior to session. pt long sitting on pan in bed General ADL Comments: Upon initiating sitting up for self care pt with increase nausea and needed to return to supine.   Mobility Overal bed mobility: Needs Assistance Bed Mobility: Rolling, Supine to Sit, Sit to Supine Rolling: Supervision Supine to sit: Mod assist Sit to supine: Mod assist General bed mobility comments: up in recliner   Transfers Overall transfer level: Needs assistance Equipment used: Rolling walker (2 wheeled) Transfer via Lift Equipment: Stedy Transfers: Sit to/from [employer] to Stand: Min assist Stand pivot transfers: +2 physical assistance, Max assist (Use of Denna Haggard) General transfer comment: pt with L LE blocked and able to compelte sit<>Stand. pt unable to sustain static standing   Ambulation / Gait / Stairs / Scientist, clinical (histocompatibility and immunogenetics) / Balance Balance Overall balance assessment: Needs assistance Sitting-balance support: Bilateral upper extremity supported, Feet supported Sitting balance-Leahy Scale: Good Standing balance support: Bilateral upper extremity supported, During functional activity Standing balance-Leahy Scale: Poor Standing balance comment: Walker and +47mn A for static standing   Special needs/care consideration BiPAP/CPAP No CPM No Continuous Drip IV  0.45% NS at 50 mL/hr Dialysis No       Life Vest No Oxygen No Special Bed No Trach Size No Wound Vac (area) No    Skin: Old R BKA.  Has skin lesions X 1 yr likely related to DM                            Bowel mgmt: Last BM 09/02/15 Bladder mgmt: Urinary catheter in place Diabetic mgmt:  Yes, non-compliance   Previous Home Environment Living Arrangements: Children Available Help at Discharge: Family Type of Home: House Home Layout: Two level, Able to live on main level with bedroom/bathroom Alternate Level Stairs-Rails: Left Alternate Level Stairs-Number of Steps: 14 Home Access: Stairs to enter Entrance Stairs-Rails: None Entrance Stairs-Number of Steps: 1 Bathroom Shower/Tub: Chiropodist: [place]: Other (Comment) (potential need for assistance once discharged) Additional Comments: pt reports hopping with RW PTA  Discharge Living Setting Plans for Discharge Living Setting: Patient's home, House, Lives with (comment) (Lives with her children.) Type of Home at Discharge: House Discharge Home Layout: Two level, 1/2 bath on main level, Bed/bath upstairs (Has a hospital type bed on main level.) Alternate Level Stairs-Number of Steps: 14 Discharge Home Access: Stairs to enter Entrance Stairs-Number of Steps: 1 step at front entry Does the patient have any problems obtaining your medications?: Yes (Describe) (no insurance or transportation)  Social/Family/Support Systems Patient Roles: Parent, Other (Comment) (Has a mom, and 22 yo and 42 yo children.) Contact Information: Teacher, music - mother Anticipated Caregiver: Mom Anticipated Caregiver's Contact Information: Patsy - mom (h) [phone]; (c) [phone] Ability/Limitations of Caregiver: Mom lives in [place] and is helping out with child care.  Mom has a husband in the hospital and she has limited time available to care for patient. Caregiver Availability: Intermittent Discharge Plan  Discussed with Primary Caregiver: Yes Is Caregiver In Agreement with Plan?: Yes Does Caregiver/Family have Issues with Lodging/Transportation while Pt is in Rehab?: No  Goals/Additional Needs Patient/Family Goal for Rehab: PT/OT mod I and supervision goals Expected length of stay: 10-14 days Cultural Considerations: [employer] Dietary Needs: Carb mod, med cal, thin liquids Equipment Needs: TBD Pt/Family Agrees to Admission and willing to participate: Yes Program Orientation Provided & Reviewed with Pt/Caregiver Including Roles  & Responsibilities: Yes  Decrease burden of Care through IP rehab admission: N/A  Possible need for SNF placement upon discharge: No, patient has refused SNF placement and plans to return home after rehab.  Patient Condition: This patient's medical and functional status has changed since the consult dated: 09/02/15 in which the Rehabilitation Physician determined and documented that the patient's condition is appropriate for intensive rehabilitative care in an inpatient rehabilitation facility. See "History of Present Illness" (above) for medical update. Functional changes are:  Currently requiring min assist for transfers. Patient's medical and functional status update has been discussed with the Rehabilitation physician and patient remains appropriate for inpatient rehabilitation. Will admit to inpatient rehab today.  Preadmission Screen Completed By:  [name], 09/04/2015 12:35 PM ______________________________________________________________________   Discussed status with Dr. Letta Pate on 09/04/15 at 1234 and received telephone approval for admission today.  Admission Coordinator:  [name], time1234/Date08/31/17       Cosigned by: Charlett Blake, MD at 09/04/2015 1:26 PM  Revision History

## 2015-09-05 NOTE — Progress Notes (Addendum)
[place] PHYSICAL MEDICINE & REHABILITATION     PROGRESS NOTE  Subjective/Complaints:  Pt sitting up in bed this AM, eating breakfast.  She complains of nausea/vomitting, which has been present for some time.  She also notes poor sleep.   ROS:  +Nausea/vomitting, sleep disturbance, numbness hand/feet.  Denies CP, SOB.  Objective: Vital Signs: Blood pressure (!) 156/69, pulse (!) 103, temperature 98.7 F (37.1 C), temperature source Oral, resp. rate 17, height 5\' 7"  (1.702 m), weight 98.5 kg (217 lb 3.2 oz), SpO2 97 %. No results found.  Recent Labs  09/04/15 0336  WBC 7.3  HGB 7.9*  HCT 25.8*  PLT 102*    Recent Labs  09/03/15 0358 09/04/15 0338  NA 143 138  K 4.6 4.8  CL 110 106  GLUCOSE 93 157*  BUN 25* 22*  CREATININE 2.24* 2.05*  CALCIUM 8.5* 8.8*   CBG (last 3)   Recent Labs  09/04/15 1805 09/04/15 2147 09/05/15 0623  GLUCAP 129* 137* 141*    Wt Readings from Last 3 Encounters:  09/05/15 98.5 kg (217 lb 3.2 oz)  09/03/15 101 kg (222 lb 10.6 oz)  12/14/12 90.7 kg (200 lb)    Physical Exam:  BP (!) 156/69 (BP Location: Right Arm)   Pulse (!) 103   Temp 98.7 F (37.1 C) (Oral)   Resp 17   Ht 5\' 7"  (1.702 m)   Wt 98.5 kg (217 lb 3.2 oz)   LMP  (LMP Unknown)   SpO2 97%   BMI 34.02 kg/m  Constitutional: She appears well-developed and well-nourished. Obese  HENT: Normocephalic and atraumatic.  Eyes: Conjunctivae and EOM are normal.  Cardiovascular: Normal rate and regular rhythm.  No murmur heard. Respiratory: Effort normal and breath sounds normal.  GI: Soft. Bowel sounds are normal. She exhibits no distension. There is no tenderness.  Musculoskeletal: She exhibits edema. She exhibits no tenderness.  R-BKA well healed. Neurological: She is alert and oriented.  Speech clear and follows commands without difficulty.   Sensory deficits LLE, right residual limb and bilateral hands.  Motor: B/l UE: 4/5 proximal to distal LLE: 4/5 hip flexion, knee  extension, 4+/5 ankle dorsi/plantar flexion RLE: 4+/5 hip flexion, knee extension  Skin: Skin is warm and dry. Diffuse skin lesions, gluteal ulcer not examined today. Psychiatric: She has flat mood and affect. Her behavior is normal. Thought content normal.   Assessment/Plan: 1. Functional deficits secondary to debility which require 3+ hours per day of interdisciplinary therapy in a comprehensive inpatient rehab setting. Physiatrist is providing close team supervision and 24 hour management of active medical problems listed below. Physiatrist and rehab team continue to assess barriers to discharge/monitor patient progress toward functional and medical goals.  Function:  Bathing Bathing position      Bathing parts      Bathing assist        Upper Body Dressing/Undressing Upper body dressing                    Upper body assist        Lower Body Dressing/Undressing Lower body dressing                                  Lower body assist        Toileting Toileting          Toileting assist     Transfers Chair/bed transfer  Locomotion Ambulation           Wheelchair          Cognition Comprehension    Expression Expression assist level: Expresses complex 90% of the time/cues < 10% of the time  Social Interaction Social Interaction assist level: Interacts appropriately 90% of the time - Needs monitoring or encouragement for participation or interaction.  Problem Solving Problem solving assist level: Solves complex 90% of the time/cues < 10% of the time  Memory Memory assist level: Recognizes or recalls 90% of the time/requires cueing < 10% of the time      Medical Problem List and Plan: 1. Deconditioning secondary to multiple medical issues including severe anemia, acute renal failure  Begin CIR 2. DVT Prophylaxis/Anticoagulation: Mechanical: Sequential compression devices, below knee Bilateral lower extremities 3.  Pain Management: Norco prn for chronic back pain 4. Mood: Team to provide ego support. LCSW to follow for evaluation and support.  5. Neuropsych: This patient iscapable of making decisions on herown behalf. 6. Skin/Wound Care: Aquacel Silver for MASD gluteal cleft.  7. Fluids/Electrolytes/Nutrition:   Scheduled compazine before meals for ongoing nausea, ?gastroparesis.   Discussed food choices to help with intake/nausea. Consult dietician for education. 10 T2DM with neuropathy, nephropathy, vasculopathy and ?gastroparesis:   Monitor BS ac/hs.   SSI for elevated BS.  11. CKD: Avoid NSAIDs and other nephrotoxic drugs.   Hypocalcemia managed with tums. Monitor weights daily.   Will plan to d/c IVF in near future 12. Bilateral hydronephrosis due to chronic urinary retention:   Will d/c foley in near future, currently significant outpt   Voiding trial as output decreases.  13. Anemia of chronic disease: On IV iron for supplement--received dose of Aransep on 8/29.   Hb pending for 9/1 14. Thrombocytopenia: Monitor with serial checks  Plts pending 9/1 15. Constipation  Bowel reg increased on 9/1  LOS (Days) 1 A FACE TO FACE EVALUATION WAS PERFORMED  Arin Peral Karis Juba 09/05/2015 8:27 AM

## 2015-09-05 NOTE — Evaluation (Signed)
Physical Therapy Assessment and Plan  Patient Details  Name: [name] MRN: [id number] Date of Birth: 11/10/1965  PT Diagnosis: Abnormality of gait, Difficulty walking, Impaired sensation and Muscle weakness Rehab Potential: Good ELOS: 2 weeks.    Today's Date: 09/05/2015 PT Individual Time: 1001-1100 PT Individual Time Calculation (min): 59 min      Problem List:  Patient Active Problem List   Diagnosis Date Noted  . Slow transit constipation   . Anemia of chronic disease   . Other hydronephrosis   . Urinary retention   . CKD (chronic kidney disease)   . Uncontrolled type 2 diabetes mellitus with diabetic nephropathy, without long-term current use of insulin (HCC)   . Vasculopathy   . Debility 09/04/2015  . AKI (acute kidney injury) (HCC)   . DM type 2 with diabetic peripheral neuropathy (HCC)   . S/P BKA (below knee amputation) unilateral (HCC)   . Medical non-compliance   . Benign skin lesion of multiple sites   . Tachypnea   . Acute blood loss anemia   . Neurogenic bladder   . Occult blood positive stool   . Rectovaginal fistula   . Controlled diabetes mellitus type 2 with complications (HCC)   . Acute on chronic renal failure (HCC)   . Hydronephrosis determined by ultrasound   . Papular rash   . Uncontrolled type 2 diabetes mellitus with complication (HCC)   . Paresthesia   . Thrombocytopenia (HCC) 08/23/2015  . Pressure ulcer 08/23/2015  . Severe anemia 08/23/2015  . UTI (lower urinary tract infection) 08/23/2015  . Absolute anemia 08/23/2015  . Peripheral vascular disease, unspecified (HCC) 04/27/2012  . Unilateral complete BKA (HCC) 07/09/2011  . Gas gangrene (HCC) 07/02/2011  . Sepsis(995.91) 07/02/2011  . Acute renal failure (HCC) 07/02/2011  . Diabetes mellitus (HCC) 11/22/2010  . Diabetic foot ulcer associated with type 2 diabetes mellitus (HCC)   . Hypertension   . Arthritis   . Neuropathy (HCC)   . Intracerebral hemorrhage (HCC)     Past  Medical History:  Past Medical History:  Diagnosis Date  . Arthritis   . Diabetes mellitus   . Diabetic foot ulcer associated with type 2 diabetes mellitus (HCC)   . Hypertension   . Intracerebral hemorrhage (HCC) As a teenager    States she had burst blood vessel as teenager, now with resultant minor visual field and hearing deficits  . Neuropathy (HCC)   . Stroke (HCC)    Past Surgical History:  Past Surgical History:  Procedure Laterality Date  . AMPUTATION  11/24/2010   Procedure: AMPUTATION FOOT;  Surgeon: John Hewitt, MD;  [facility];  Service: Orthopedics;  Laterality: Right;  Right  FOOT TRANS METATARSAL AMPUTATION  . AMPUTATION  07/02/2011   Procedure: AMPUTATION BELOW KNEE;  Surgeon: John Hewitt, MD;  Location: [facility];  Service: Orthopedics;  Laterality: Right;  . APPLICATION OF WOUND VAC  07/02/2011   Procedure: APPLICATION OF WOUND VAC;  Surgeon: John Hewitt, MD;  [facility]: [facility];  Service: Orthopedics;  Laterality: Right;  . BRAIN SURGERY  1980's   Previous brain surgery in [place], [place]  . CESAREAN SECTION    . I&D EXTREMITY  11/24/2010   Procedure: IRRIGATION AND DEBRIDEMENT EXTREMITY;  Surgeon: John Hewitt, MD;  [facility];  Service: Orthopedics;  Laterality: Left;  Debriedment of left plantar ulcer and trimming of toenails  . I&D EXTREMITY  07/02/2011   Procedure: IRRIGATION AND DEBRIDEMENT EXTREMITY;  Surgeon: John Hewitt, MD;    Location: [place];  Service: Orthopedics;  Laterality: Left;  I & D of Left Foot  . I&D EXTREMITY  07/06/2011   Procedure: IRRIGATION AND DEBRIDEMENT EXTREMITY;  Surgeon: Wylene Simmer, MD;  Location: [place];  Service: Orthopedics;  Laterality: Right;  IRRIGATION/DEBRIDEMENT RIGHT BELOW KNEE AMPUTATION    Assessment & Plan Clinical Impression: Patient is a 50 y.o.femalewith history of T2DM with neuropathy, ICH, diabetic foot ulcer s/p R-BKA 7/13, medication non-compliance for past year who was admitted on 08/23/15 with malaise, inability to  walk, dyspnea and numbness of her hands. She was found to have multiple skin lesions with metabolic acidosis, severe normocytic-normochromic anemia--hgb 4.8 and thrombocytopenia - 10. She was found to have acute renal failure and renal ultrasound showed normal kidneys with distended bladder and bilateral hydronephrosis. She was transfused with 2 units PRBC and one unit platelets and started on Levaquin for UTI. Dr [name] consulted for work up and recommended skin biopsy as well as autoimmune studies. Bone marrow biopsy negative and skin bx path pending. Dr. Justin Mend consulted for input and felt that assumed ARF in setting of bilateral hydronephrosis and BOO. Urine output improved with foley and SCr on downward trend. He felt lesions likely chronic diabetic lesions and recommendedGUconsult. Repeat ultrasound 8/27 showed moderate bilateral hydronephrosis. CT abdomen/pelvis done due to concerns of rectovaginal fistula. This showed diffuse anasarca, no evidence of fistula and moderately enlarged bilateral inguinal lymph nodes.   Dr.Herrick evaluated patient and felt that urinary retention has been chronic leading to ureteral reflux and hydronephrosis. He felt that hydronephrosis had improved with diuresis/foley and that ureteral stents not recommended. Hydronephrosis should improve over next few weeks with diuresis--to repeat voiding trial and to educate patient on in and out caths v/s indwelling foley. She continues to have impressive UOP with downward trend in Scr- 2.05 and weight to 101 kg. Skin Biopsy revealed epidermal hyperplasia with fibrosing granulation tissue. She continues to have persistent coagulopathy--PT/PTT mixing studies pending. Heme/Onc recommends erythropoietin/iron for anemia and questions presence of lupus anticoagulant. To transfuse as needed for platelets less than 10K  Patient transferred to CIR on 09/04/2015 .   Patient currently requires max with mobility secondary to muscle  weakness, decreased cardiorespiratoy endurance and decreased standing balance, decreased postural control and decreased balance strategies.  Prior to hospitalization, patient was modified independent  with mobility and lived with Son, Daughter (22 and 75 y/o) in a House home.  Home access is 1Stairs to enter.  Patient will benefit from skilled PT intervention to maximize safe functional mobility, minimize fall risk and decrease caregiver burden for planned discharge home with intermittent assist.  Anticipate patient will benefit from follow up [facility] at discharge.  PT - End of Session Activity Tolerance: Tolerates 10 - 20 min activity with multiple rests Endurance Deficit: Yes PT Assessment Rehab Potential (ACUTE/IP ONLY): Good Barriers to Discharge: Inaccessible home environment;Decreased caregiver support PT Patient demonstrates impairments in the following area(s): Balance;Endurance;Pain;Safety PT Transfers Functional Problem(s): Bed Mobility;Bed to Chair;Car;Furniture;Floor PT Locomotion Functional Problem(s): Ambulation;Wheelchair Mobility;Stairs PT Plan PT Intensity: Minimum of 1-2 x/day ,45 to 90 minutes PT Frequency: 5 out of 7 days PT Duration Estimated Length of Stay: 2 weeks.  PT Treatment/Interventions: Ambulation/gait training;Balance/vestibular training;Cognitive remediation/compensation;Community reintegration;Discharge planning;Disease management/prevention;DME/adaptive equipment instruction;Functional mobility training;Neuromuscular re-education;Pain management;Patient/family education;Psychosocial support;Skin care/wound management;Splinting/orthotics;Stair training;Therapeutic Activities;Therapeutic Exercise;UE/LE Strength taining/ROM;UE/LE Coordination activities;Visual/perceptual remediation/compensation;Wheelchair propulsion/positioning PT Transfers Anticipated Outcome(s): Mod I with LRAD.  PT Locomotion Anticipated Outcome(s): Mod I with WC and supervision gait for short  distances.  PT  Recommendation Follow Up Recommendations: Home health PT Patient destination: Home Equipment Recommended: To be determined Equipment Details: Patient has RW and WC.     Skilled Therapeutic Intervention   Session 1:  Patient instructed in PT evaluation; see below for results. PT educated patient on purpose and goals of inpatient rehab as well as estimated length of stay and potential treatment interventions.   Session 2:  PT instructed patient in sit<>stand with RW and mod A from PT with max cues for safety. PT instructed car transfer with squat pivot and max A from safety and decreased anxiety. Patient performed squat pivot to bed with max A. Sit>supine with supervision A from PT.   Patient left supine in bed and all needs met.    PT Evaluation Precautions/Restrictions Precautions Precautions: Fall Precaution Comments: R BKA 2013 General   Vital Signs Pain Pain Assessment Pain Assessment: No/denies pain Home Living/Prior Functioning Home Living Available Help at Discharge: Family Type of Home: House Home Access: Stairs to enter Entrance Stairs-Number of Steps: 1 Entrance Stairs-Rails: None Home Layout: Two level;Able to live on main level with bedroom/bathroom Alternate Level Stairs-Number of Steps: 14 Alternate Level Stairs-Rails: Left Bathroom Shower/Tub: Tub/shower unit Bathroom Toilet: Standard Additional Comments: pt reports hopping with RW PTA  Lives With: Son;Daughter (11 and 13 y/o) Prior Function Comments: WC mobillity in home with hopping with RW.  Vision/Perception     Cognition Overall Cognitive Status: Within Functional Limits for tasks assessed Arousal/Alertness: Awake/alert Memory: Appears intact Awareness: Appears intact Problem Solving: Impaired Problem Solving Impairment: Functional complex Safety/Judgment: Appears intact Comments: fear of falling  Sensation Sensation Light Touch: Impaired by gross assessment Hot/Cold:  Impaired by gross assessment Proprioception: Appears Intact Coordination Gross Motor Movements are Fluid and Coordinated: Yes Fine Motor Movements are Fluid and Coordinated: Yes Motor  Motor Motor: Within Functional Limits;Other (comment) Motor - Skilled Clinical Observations: Generalized weakness.   Mobility Bed Mobility Bed Mobility: Rolling Right;Rolling Left;Sit to Supine;Supine to Sit Rolling Right: 5: Supervision Rolling Right Details: Verbal cues for technique Rolling Left: 5: Supervision Rolling Left Details: Verbal cues for technique;Verbal cues for precautions/safety Supine to Sit: 5: Supervision Supine to Sit Details: Verbal cues for precautions/safety;Verbal cues for technique Sit to Supine: 5: Supervision Transfers Transfers: Yes Sit to Stand: 3: Mod assist (in parallel bars. ) Sit to Stand Details: Verbal cues for technique;Verbal cues for precautions/safety;Verbal cues for gait pattern;Tactile cues for placement;Tactile cues for weight beaing;Visual cues for safe use of DME/AE Stand Pivot Transfers: 2: Max assist Stand Pivot Transfer Details: Verbal cues for sequencing;Verbal cues for technique;Verbal cues for precautions/safety;Tactile cues for placement;Manual facilitation for placement;Verbal cues for safe use of DME/AE Lateral/Scoot Transfers: 4: Min assist Lateral/Scoot Transfer Details: Verbal cues for sequencing;Verbal cues for technique;Verbal cues for precautions/safety;Verbal cues for gait pattern;Verbal cues for safe use of DME/AE;Manual facilitation for placement (with SB) Locomotion  Ambulation Ambulation: No Gait Gait: No Stairs / Additional Locomotion Stairs: No Wheelchair Mobility Wheelchair Mobility: Yes Wheelchair Assistance: 5: Supervision Wheelchair Propulsion: Both upper extremities Wheelchair Parts Management: Needs assistance Distance: 150ft   Trunk/Postural Assessment  Cervical Assessment Cervical Assessment: Within Functional  Limits Thoracic Assessment Thoracic Assessment: Within Functional Limits Lumbar Assessment Lumbar Assessment: Exceptions to WFL (posterior pelvic tilt) Postural Control Postural Control: Within Functional Limits  Balance Balance Balance Assessed: Yes Static Sitting Balance Static Sitting - Balance Support: No upper extremity supported Static Sitting - Level of Assistance: 6: Modified independent (Device/Increase time) Dynamic Sitting Balance Dynamic Sitting - Balance Support: No   upper extremity supported Dynamic Sitting - Level of Assistance: 5: Stand by assistance Static Standing Balance Static Standing - Balance Support: Bilateral upper extremity supported Static Standing - Level of Assistance: 4: Min assist Dynamic Standing Balance Dynamic Standing - Balance Support: Bilateral upper extremity supported Dynamic Standing - Level of Assistance: 2: Max assist Dynamic Standing - Balance Activities: Lateral lean/weight shifting Extremity Assessment      RLE Assessment RLE Assessment: Exceptions to [facility] RLE AROM (degrees) RLE Overall AROM Comments: 4/5 throughout LE.  LLE Assessment LLE Assessment: Within Functional Limits (grossly 4+/5 throughout LE except hip abduction: 4/5)   See Function Navigator for Current Functional Status.   Refer to Care Plan for Long Term Goals  Recommendations for other services: None  Discharge Criteria: Patient will be discharged from PT if patient refuses treatment 3 consecutive times without medical reason, if treatment goals not met, if there is a change in medical status, if patient makes no progress towards goals or if patient is discharged from hospital.  The above assessment, treatment plan, treatment alternatives and goals were discussed and mutually agreed upon: by patient  Lorie Phenix 09/05/2015, 12:30 PM

## 2015-09-05 NOTE — Care Management Note (Signed)
[facility] Individual Statement of Services  Patient Name:  Earvin HansenMichelle L Aversa  Date:  09/05/2015  Welcome to the [facility].  Our goal is to provide you with an individualized program based on your diagnosis and situation, designed to meet your specific needs.  With this comprehensive rehabilitation program, you will be expected to participate in at least 3 hours of rehabilitation therapies Monday-Friday, with modified therapy programming on the weekends.  Your rehabilitation program will include the following services:  Physical Therapy (PT), Occupational Therapy (OT), 24 hour per day rehabilitation nursing, Therapeutic Recreaction (TR), Neuropsychology, Case Management (Social Worker), Rehabilitation Medicine, Nutrition Services and Pharmacy Services  Weekly team conferences will be held on Wednesday to discuss your progress.  Your Social Worker will talk with you frequently to get your input and to update you on team discussions.  Team conferences with you and your family in attendance may also be held.  Expected length of stay: 14 days  Overall anticipated outcome: mod/i-supervision with ambulation short distances  Depending on your progress and recovery, your program may change. Your Social Worker will coordinate services and will keep you informed of any changes. Your Social Worker's name and contact numbers are listed  below.  The following services may also be recommended but are not provided by the Inpatient [facility]:    Home Health Rehabiltiation Services  Outpatient Rehabilitation Services   Arrangements will be made to provide these services after discharge if needed.  Arrangements include referral to agencies that provide these services.  Your insurance has been verified to be:  None-applying for Medicaid Your primary doctor is:  None  Pertinent information will be shared with your doctor and your insurance  company.  Social Worker:  Dossie DerBecky Yehudit Fulginiti, SW [phone] or [phone]) [phone]  Information discussed with and copy given to patient by: Lucy Chrisupree, Jacey Eckerson G, 09/05/2015, 1:21 PM

## 2015-09-05 NOTE — Progress Notes (Signed)
Social Work Assessment and Plan Social Work Assessment and Plan  Patient Details  Name: [name] MRN: [id number] Date of Birth: 1965-09-09  Today's Date: 09/05/2015  Problem List:  Patient Active Problem List   Diagnosis Date Noted  . Slow transit constipation   . Anemia of chronic disease   . Other hydronephrosis   . Urinary retention   . CKD (chronic kidney disease)   . Uncontrolled type 2 diabetes mellitus with diabetic nephropathy, without long-term current use of insulin (HCC)   . Vasculopathy   . Debility 09/04/2015  . AKI (acute kidney injury) (HCC)   . DM type 2 with diabetic peripheral neuropathy (HCC)   . S/P BKA (below knee amputation) unilateral (HCC)   . Medical non-compliance   . Benign skin lesion of multiple sites   . Tachypnea   . Acute blood loss anemia   . Neurogenic bladder   . Occult blood positive stool   . Rectovaginal fistula   . Controlled diabetes mellitus type 2 with complications (HCC)   . Acute on chronic renal failure (HCC)   . Hydronephrosis determined by ultrasound   . Papular rash   . Uncontrolled type 2 diabetes mellitus with complication (HCC)   . Paresthesia   . Thrombocytopenia (HCC) 08/23/2015  . Pressure ulcer 08/23/2015  . Severe anemia 08/23/2015  . UTI (lower urinary tract infection) 08/23/2015  . Absolute anemia 08/23/2015  . Peripheral vascular disease, unspecified (HCC) 04/27/2012  . Unilateral complete BKA (HCC) 07/09/2011  . Gas gangrene (HCC) 07/02/2011  . Sepsis(995.91) 07/02/2011  . Acute renal failure (HCC) 07/02/2011  . Diabetes mellitus (HCC) 11/22/2010  . Diabetic foot ulcer associated with type 2 diabetes mellitus (HCC)   . Hypertension   . Arthritis   . Neuropathy (HCC)   . Intracerebral hemorrhage [facility])    Past Medical History:  Past Medical History:  Diagnosis Date  . Arthritis   . Diabetes mellitus   . Diabetic foot ulcer associated with type 2 diabetes mellitus (HCC)   . Hypertension   .  Intracerebral hemorrhage (HCC) As a teenager    States she had burst blood vessel as teenager, now with resultant minor visual field and hearing deficits  . Neuropathy (HCC)   . Stroke [facility])    Past Surgical History:  Past Surgical History:  Procedure Laterality Date  . AMPUTATION  11/24/2010   Procedure: AMPUTATION FOOT;  Surgeon: [name], MD;  Location: [facility] OR;  Service: Orthopedics;  Laterality: Right;  Right  FOOT TRANS METATARSAL AMPUTATION  . AMPUTATION  07/02/2011   Procedure: AMPUTATION BELOW KNEE;  Surgeon: [name], MD;  [facility];  Service: Orthopedics;  Laterality: Right;  . APPLICATION OF WOUND VAC  07/02/2011   Procedure: APPLICATION OF WOUND VAC;  Surgeon: [name], MD;  [facility];  Service: Orthopedics;  Laterality: Right;  . BRAIN SURGERY  1980's   Previous brain surgery in [place], [place]  . CESAREAN SECTION    . I&D EXTREMITY  11/24/2010   Procedure: IRRIGATION AND DEBRIDEMENT EXTREMITY;  Surgeon: [name], MD;  [facility];  Service: Orthopedics;  Laterality: Left;  Debriedment of left plantar ulcer and trimming of toenails  . I&D EXTREMITY  07/02/2011   Procedure: IRRIGATION AND DEBRIDEMENT EXTREMITY;  Surgeon: [name], MD;  [facility];  Service: Orthopedics;  Laterality: Left;  I & D of Left Foot  . I&D EXTREMITY  07/06/2011   Procedure: IRRIGATION AND DEBRIDEMENT EXTREMITY;  Surgeon: Jonny Ruiz  [name], MD;  [facility];  Service: Orthopedics;  Laterality: Right;  IRRIGATION/DEBRIDEMENT RIGHT BELOW KNEE AMPUTATION   Social History:  reports that she has never smoked. She has never used smokeless tobacco. She reports that she does not drink alcohol or use drugs.  Family / Support Systems Marital Status: Widow/Widower Patient Roles: Parent Children: She has a 30 & 60 yo Other Supports: [name]  [phone] Anticipated Caregiver: Mom Ability/Limitations of Caregiver: mom is currently taking care of her  children, stay here during the week due to in school then to her home in [place] on the weekends. Her husband is a patient at [facility] so she visits him while kids are in school Caregiver Availability: Intermittent Family Dynamics: Pt is widowed since 2013 and she is the sole caregiver of her two children. They have managed until pt got so sick and was hospitalized. Her Mom is supportive but does live one hour away. Suggested she move closer to her family, pt likes her home they are in.  Social History Preferred language: English Religion: Baptist Cultural Background: No issues Education: High School Read: Yes Write: Yes Employment Status: Unemployed Date Retired/Disabled/Unemployed: 2013 Fish farm manager Issues: No issues Guardian/Conservator: None-according to MD pt is capable of making her own decisions while here.   Abuse/Neglect Physical Abuse: Denies Verbal Abuse: Denies Sexual Abuse: Denies Exploitation of patient/patient's resources: Denies Self-Neglect: Denies  Emotional Status Pt's affect, behavior adn adjustment status: Pt is motivated but not feeling well today. She is nauseous and throwing up. She has been to CIR before and did well and went home after her BKA. Pt is devoted to her children and will do what is needed to get back to them. She needs to realize she needs to take care of herself so  she can assist them and be here for them. Recent Psychosocial Issues: multiple health issues had not seen a MD in a year or more since insurance lapsed due to cobra termed Pyschiatric History: History of anixety takes medications for this and finds it helpful when she can afford to take it. Deferred depression due to pt not feeling well, but she would benefit from seeing neuro-psych while here. Will make referral. Substance Abuse History: No issues  Patient / Family Perceptions, Expectations & Goals Pt/Family understanding of illness & functional limitations: Pt and Mom have a  basic understanding of her condition but MD still figuring it all out and test results still pending. She does ask questions of the MD and feels she knows what is going on. She seems to need much education regarding her diagnosese. Premorbid pt/family roles/activities: Mom, Daughter, [employer], Widow, etc Anticipated changes in roles/activities/participation: resume Pt/family expectations/goals: Pt states: " I need to get independent so I can take care of my kids."  Mom states: " She is one tough woman she will do it and overcome this."  [employer]: Other (Comment) (applying for Medicaid) Premorbid Home Care/DME Agencies: Other (Comment) (had HH in the past) Transportation available at discharge: uses taxis doesn't drive Resource referrals recommended: Neuropsychology, Support group (specify)  Discharge Planning Living Arrangements: Children Support Systems: Children, Parent, Other relatives Type of Residence: Private residence Insurance Resources: Self-pay (applying for [employer]) Financial Resources: Other (Comment) (SS through deceased husband & children receive SS also) Financial Screen Referred: Yes Living Expenses: Motgage Money Management: Patient Does the patient have any problems obtaining your medications?: Yes (Describe) (has no insurance ) Home Management: Patient and children Patient/Family Preliminary Plans:  Return home with her children unsure if Mom can stay with them a short time to assist in the transition, dependent upon her husband's hospitalization and what he will need. Pt has been here before for a short time in 2013 after BKA. Hopefuly pt once feels better can do well and participate fully in therapies. Social Work Anticipated Follow Up Needs: HH/OP, Support Group  Clinical Impression Sick patient who is trying to participate in therapies but is throwing up. She seems to have multiple medical issues and hopefully once is more able to  participate in therapies she will do well. She is aware of the rehab process since has been Here before. Would benefit from seeing neuro-psych will make referral and work on disability and Medicaid applications, she will need a PCP and a way to pay for her medications. Will work on a safe discharge plan. Provide support to Mom who is trying to care for her grandchildren and run back and forth to see her [facility].   Lucy Chrisupree, Pretty Weltman G 09/05/2015, 1:48 PM

## 2015-09-05 NOTE — Progress Notes (Signed)
S: No acute events.  Transferred to rehab.  Looks like post-obsructive diuresis is finally slowing down.  O:BP (!) 156/69 (BP Location: Right Arm)   Pulse (!) 103   Temp 98.7 F (37.1 C) (Oral)   Resp 17   Ht '5\' 7"'  (1.702 m)   Wt 98.5 kg (217 lb 3.2 oz)   LMP  (LMP Unknown)   SpO2 97%   BMI 34.02 kg/m   Intake/Output Summary (Last 24 hours) at 09/05/15 1020 Last data filed at 09/05/15 0643  Gross per 24 hour  Intake              120 ml  Output             2151 ml  Net            -2031 ml   Weight change:  Gen: sleeping but easily arousable CVS:RRR Resp:Clear Abd:+ BS NTND Ext:Rt BKA.  No edema.  Mult chronic lesions over arms/legs/torso NEURO: AAO x 3 GU clear pale yellow urine in Foley   . calcium carbonate  2 tablet Oral TID  . [START ON 09/06/2015] ferric gluconate (FERRLECIT/NULECIT) IV  125 mg Intravenous QODAY  . insulin aspart  0-20 Units Subcutaneous TID WC  . insulin aspart  0-5 Units Subcutaneous QHS  . prochlorperazine  5 mg Oral TID AC  . senna-docusate  2 tablet Oral BID   No results found. BMET    Component Value Date/Time   NA 136 09/05/2015 0823   K 3.9 09/05/2015 0823   CL 104 09/05/2015 0823   CO2 25 09/05/2015 0823   GLUCOSE 141 (H) 09/05/2015 0823   BUN 15 09/05/2015 0823   CREATININE 1.79 (H) 09/05/2015 0823   CALCIUM 9.1 09/05/2015 0823   GFRNONAA 32 (L) 09/05/2015 0823   GFRAA 38 (L) 09/05/2015 0823   CBC    Component Value Date/Time   WBC 7.8 09/05/2015 0823   RBC 3.28 (L) 09/05/2015 0823   HGB 8.9 (L) 09/05/2015 0823   HCT 29.2 (L) 09/05/2015 0823   HCT 20.8 (L) 08/26/2015 1436   PLT 148 (L) 09/05/2015 0823   MCV 89.0 09/05/2015 0823   MCH 27.1 09/05/2015 0823   MCHC 30.5 09/05/2015 0823   RDW 15.8 (H) 09/05/2015 0823   LYMPHSABS 1.0 09/05/2015 0823   MONOABS 0.3 09/05/2015 0823   EOSABS 0.0 09/05/2015 0823   BASOSABS 0.0 09/05/2015 0823     Assessment/ Plan:  1. ARF in setting of Bil hydronephrosis,  BOO as UO  excellent after foley placement and Scr cont to decrease.  Repeat renal US showing persistence of bilateral hydro- would have expected this to improve more but pt has robust urine output with downtrending creatinine.  Urology consulted as well, appreciate recs.  Will need repeat imaging after post-obstructive diuresis (in a few weeks).  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    2. Anemia/thrombocytopenia of unclear etiology.  BM biopsy has been unrevealing. Iron % sat is 15 with worsening anemia.  Repleting iron stores with Ferric gluconate IV (started 8/29), Aranesp 60 mcg given  as well (8/29)  Hemoglobin is improving!  3. DM : likely causing a neurogenic bladder, has neuropathy as well.  She may need to self-cath after the Foley comes out.  4.  Chronic diabetic skin lesions, biopsy reveals hyperplasia seen in prurigo and stasis-like change 5. Met acidosis, resolved 6. Hypocalcemia on oral replacement 7. Hypomag 8. Hypokalemia, resolved  Madelon Lips  [facility] pgr [phone] cell: [phone]

## 2015-09-05 NOTE — Progress Notes (Signed)
Initial Nutrition Assessment  DOCUMENTATION CODES:   Severe malnutrition in context of acute illness/injury, Obesity unspecified  INTERVENTION:  Magic cup TID with meals, each supplement provides 290 kcal and 9 grams of protein.  Recommend snacks TID between meals to supplement intake. Discussed options high in protein and that may be more tolerated in setting of N/V.  NUTRITION DIAGNOSIS:   Malnutrition related to acute illness (dyspnea, acute renal failure, debility) as evidenced by energy intake < or equal to 50% for > or equal to 5 days, 6 percent weight loss in 1 week.  GOAL:   Patient will meet greater than or equal to 90% of their needs  MONITOR:   PO intake, Supplement acceptance, Labs, Skin  REASON FOR ASSESSMENT:   Malnutrition Screening Tool, Low Braden    ASSESSMENT:   [age] y.o.femalewith history of T2DM with neuropathy, ICH, diabetic foot ulcer s/p R-BKA 7/13, medication non-compliance for past year who was admitted on 08/23/15 with malaise, inability to walk, dyspnea and numbness of her hands. She was found to have multiple skin lesions with metabolic acidosis, severe normocytic-normochromic anemia--hgb 4.8 and thrombocytopenia - 10. She was found to have acute renal failure and renal ultrasound showed normal kidneys with distended bladder and bilateral hydronephrosis. Now in rehabilitation for debility.   Ms. Daphine DeutscherMartin reports she has had poor appetite and intake since about 3 days prior to admission on 8/19. It has been due to fatigue, nausea, and vomiting. She reports that the nausea did start to improve after admission and she was able to eat more, but then it has been significantly worse the past 2 days. Last episode of emesis was today (9/1) while in patient's room after she tried to take pills with Boost Breeze. She reports she has been eating < 50% of her usual intake, which is usually 3 meals. She believes her UBW is around 240 lbs and thinks she might have lost  some weight. Per chart she was 236 lbs on 8/21. Will utilize weight of 101 kg on 8/30 as most recent weight seems inaccurate. Patient has lost 6.1 kg (6% body weight) in 1 week.  Meal Completion: 25% breakfast this AM  Medications reviewed and include: calcium carbonate 2 tablets TID, ferric gluconate 125 mg IV every other day, Novolog sliding scale TID with meals and daily at bedtime, compazine 5 mg TID before meals, senna.  Labs reviewed: CBG 129-165 past 24 hrs, HgbA1c 5.3 on 08/27/2015.  Nutrition-Focused physical exam completed. Findings are no fat depletion, no muscle depletion, and mild edema.   Discussed plan with RN.   Diet Order:  Diet Carb Modified Fluid consistency: Thin; Room service appropriate? Yes  Skin:  Wound (see comment) (DM ulcers on b/l buttocks, right leg, left foot, left breast)  Last BM:  09/04/2015  Height:   Ht Readings from Last 1 Encounters:  09/04/15 5\' 7"  (1.702 m)    Weight:   Wt Readings from Last 1 Encounters:  09/05/15 217 lb 3.2 oz (98.5 kg)    Ideal Body Weight:  61.36 kg  BMI:  Body mass index is 34.02 kg/m.  Estimated Nutritional Needs:   Kcal:  2000-2200  Protein:  125-150 grams  Fluid:  >/= 2 L/day  EDUCATION NEEDS:   Education needs no appropriate at this time (Recommend education on DM when patient feeling better and able to participate.)  Helane RimaLeanne Cortland Crehan, MS, RD, LDN

## 2015-09-05 NOTE — Progress Notes (Signed)
Patient information reviewed and entered into eRehab system by Random Dobrowski, RN, CRRN, PPS Coordinator.  Information including medical coding and functional independence measure will be reviewed and updated through discharge.    

## 2015-09-05 NOTE — Progress Notes (Signed)
Occupational Therapy Assessment and Plan  Patient Details  Name: [name] MRN: [id number] Date of Birth: [birth year]  OT Diagnosis: muscle weakness (generalized)  Rehab Potential: Rehab Potential (ACUTE ONLY): Good ELOS:12-16 days   Today's Date: 09/05/2015   Session 1 OT Individual Time: 2229-7989 OT Individual Time Calculation (min): 65 min   Session 2 OT Individual Time: 1300 - 1344 OT Individual Time Calculation (min): 45 mins     Problem List: Patient Active Problem List   Diagnosis Date Noted  . Slow transit constipation   . Anemia of chronic disease   . Other hydronephrosis   . Urinary retention   . CKD (chronic kidney disease)   . Uncontrolled type 2 diabetes mellitus with diabetic nephropathy, without long-term current use of insulin ([place])   . Vasculopathy   . Debility 09/04/2015  . AKI (acute kidney injury) ([place])   . DM type 2 with diabetic peripheral neuropathy ([place])   . S/P BKA (below knee amputation) unilateral ([place])   . Medical non-compliance   . Benign skin lesion of multiple sites   . Tachypnea   . Acute blood loss anemia   . Neurogenic bladder   . Occult blood positive stool   . Rectovaginal fistula   . Controlled diabetes mellitus type 2 with complications ([place])   . Acute on chronic renal failure ([place])   . Hydronephrosis determined by ultrasound   . Papular rash   . Uncontrolled type 2 diabetes mellitus with complication ([place])   . Paresthesia   . Thrombocytopenia ([place]) 08/23/2015  . Pressure ulcer 08/23/2015  . Severe anemia 08/23/2015  . UTI (lower urinary tract infection) 08/23/2015  . Absolute anemia 08/23/2015  . Peripheral vascular disease, unspecified ([place]) 04/27/2012  . Unilateral complete BKA ([place]) 07/09/2011  . Gas gangrene ([place]) 07/02/2011  . Sepsis(995.91) 07/02/2011  . Acute renal failure ([place]) 07/02/2011  . Diabetes mellitus ([place]) 11/22/2010  . Diabetic foot ulcer associated with type 2 diabetes mellitus ([place])   .  Hypertension   . Arthritis   . Neuropathy ([place])   . Intracerebral hemorrhage [facility])     Past Medical History:  Past Medical History:  Diagnosis Date  . Arthritis   . Diabetes mellitus   . Diabetic foot ulcer associated with type 2 diabetes mellitus ([place])   . Hypertension   . Intracerebral hemorrhage ([place]) As a teenager    States she had burst blood vessel as teenager, now with resultant minor visual field and hearing deficits  . Neuropathy ([place])   . Stroke [facility])    Past Surgical History:  Past Surgical History:  Procedure Laterality Date  . AMPUTATION  11/24/2010   Procedure: AMPUTATION FOOT;  Surgeon: Wylene Simmer, MD;  Location: [place];  Service: Orthopedics;  Laterality: Right;  Right  FOOT TRANS METATARSAL AMPUTATION  . AMPUTATION  07/02/2011   Procedure: AMPUTATION BELOW KNEE;  Surgeon: Wylene Simmer, MD;  Location: [place];  Service: Orthopedics;  Laterality: Right;  . APPLICATION OF WOUND VAC  07/02/2011   Procedure: APPLICATION OF WOUND VAC;  Surgeon: Wylene Simmer, MD;  Location: [place];  Service: Orthopedics;  Laterality: Right;  . BRAIN SURGERY  1980's   Previous brain surgery in [place], [place]  . CESAREAN SECTION    . I&D EXTREMITY  11/24/2010   Procedure: IRRIGATION AND DEBRIDEMENT EXTREMITY;  Surgeon: Wylene Simmer, MD;  Location: [place];  Service: Orthopedics;  Laterality: Left;  Debriedment of left plantar ulcer and trimming of toenails  . I&D EXTREMITY  07/02/2011   Procedure: IRRIGATION AND DEBRIDEMENT EXTREMITY;  Surgeon: Wylene Simmer, MD;  Location: [place];  Service: Orthopedics;  Laterality: Left;  I & D of Left Foot  . I&D EXTREMITY  07/06/2011   Procedure: IRRIGATION AND DEBRIDEMENT EXTREMITY;  Surgeon: Wylene Simmer, MD;  Location: [place];  Service: Orthopedics;  Laterality: Right;  IRRIGATION/DEBRIDEMENT RIGHT BELOW KNEE AMPUTATION    Assessment & Plan Clinical Impression: Patient is a 50 y.o. year old female admitted to the hospital on 08/23/15 with general weakness, severe  anemia (Hgb 4.2), UTI, AKI, skin lesions. PMH: DM, neuropathy, h/o Rt BKA, HTN, ARF, anxiety, ICH, CVA. Patient transferred to CIR on 09/04/2015 .    Patient currently requires mod with basic self-care skills secondary to muscle weakness, decreased endurance, decreased problem solving, decreased standing balance and decreased balance strategies.  Prior to hospitalization, patient could complete bathing, dressing, grooming, and functional transfers with modified independent .  Patient will benefit from skilled intervention to increase independence with basic self-care skills and increase level of independence with iADL prior to discharge home with 2 minor children. Pt may have 24 hour assist available initially from her mother who is taking care of her chilren now while she is in rehab. .  Anticipate patient will require intermittent supervision and follow up home health.  OT - End of Session Endurance Deficit: Yes OT Assessment Rehab Potential (ACUTE ONLY): Good Barriers to Discharge: Decreased caregiver support Barriers to Discharge Comments: Pt lives alone with minor chidren, her mother may stay and assist initially upon d/c OT Patient demonstrates impairments in the following area(s): Balance;Endurance;Motor;Safety;Sensory OT Basic ADL's Functional Problem(s): Eating;Grooming;Bathing;Dressing;Toileting OT Advanced ADL's Functional Problem(s): Simple Meal Preparation OT Transfers Functional Problem(s): Toilet OT Plan OT Intensity: Minimum of 1-2 x/day, 45 to 90 minutes OT Frequency: 5 out of 7 days OT Duration/Estimated Length of Stay: 10-14 OT Treatment/Interventions: Balance/vestibular training;Community reintegration;Discharge planning;DME/adaptive equipment instruction;Functional mobility training;Patient/family education;Self Care/advanced ADL retraining;Therapeutic Activities;Therapeutic Exercise;UE/LE Strength taining/ROM;UE/LE Coordination activities;Wheelchair  propulsion/positioning;Visual/perceptual remediation/compensation OT Self Feeding Anticipated Outcome(s): Mod I OT Basic Self-Care Anticipated Outcome(s): Mod I OT Toileting Anticipated Outcome(s): Mod I OT Bathroom Transfers Anticipated Outcome(s): Mod I OT Recommendation Patient destination: Home Follow Up Recommendations: Home health OT Equipment Recommended: To be determined   Skilled Therapeutic Intervention Session 1 Initial eval completed with treatment provided to address functional transfers, improved sit<>stand, standing tolerance, and adapted bathing/dressing skills. Pt able to transfer to drop arm w/c with Mod A for scooting transfers and Mod instructional cues 2/2 problem solving deficits for technique and weight shifting. Pt completed sponge bath at the sink at w/c level, able to stand with Stedy lift with Mod A, but unable to remove hands to assist with washing buttock and peri-area. Pt took seated rest break, then completed LB dressing with assist to thread L LE and pull pants and brief over hips in standing with stedy. OT discussed toilet transfer with stedy to [facility] or raised toilet. Pt left seated in w/c at end of session with needs met and call bell within reach.    Session 2 Pt seen for second skilled OT session with focus on self-care, sit<>stands, and UB/LB strengthening. RN present to change pt's wound dressing on bottom. Pt transferred to sitting EOB, then stood with min A in stedy. Pt required Max A to pull pants down 2/2 unable to remove hands form stedy. RN changed dressing, then pt transferred to w/c with stedy. Pt completed grooming at the sink at w/c level, then reported  nausea. Pt had 1 bout of emesis, RN notified. OT provided pt with built up handles for feeding utensils, then patient performed hand, elbow, shoulder there ex. Pt left seated in w/c at end of session with needs met.  OT Evaluation Precautions/Restrictions  Precautions Precautions: Fall Precaution  Comments: R BKA 2013 Pain  None/denies pain Home Living/Prior Functioning Home Living Family/patient expects to be discharged to:: Private residence Living Arrangements: Children Available Help at Discharge: Family (Mom is in town from [place] to help) Type of Home: House Home Access: Stairs to enter Technical brewer of Steps: 1 Entrance Stairs-Rails: None Home Layout: Two level, Able to live on main level with bedroom/bathroom Alternate Level Stairs-Number of Steps: 14 Alternate Level Stairs-Rails: Left Bathroom Shower/Tub: Tub/shower unit (sponge bathes, bathroom upstairs) Bathroom Toilet: Standard Additional Comments: pt reports hopping with RW PTA  Lives With: Son, Daughter (51 and 73 y/o) IADL History IADL Comments: cooks, Microbiologist, 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  ADL ADL ADL Comments: see functional navigator Vision/Perception  Vision- History Patient Visual Report: No change from baseline Vision- Assessment Vision Assessment?: Vision impaired- to be further tested in functional context Additional Comments:  Per chart- deficits in R eye lower quadrant  Cognition Overall Cognitive Status: Within Functional Limits for tasks assessed Arousal/Alertness: Awake/alert Orientation Level: Person;Place;Situation Person: Oriented Place: Oriented Situation: Oriented Year: 2017 Month: September Day of Week: Correct Memory: Appears intact Immediate Memory Recall: Sock;Blue;Bed Memory Recall: Sock;Blue;Bed Memory Recall Sock: Without Cue Memory Recall Blue: Without Cue Memory Recall Bed: Without Cue Awareness: Appears intact Problem Solving: Impaired Problem Solving Impairment: Functional complex Safety/Judgment: Appears intact Comments: fear of falling  Sensation Sensation Light Touch: Impaired by gross assessment Hot/Cold: Impaired by gross assessment Proprioception: Appears Intact Additional Comments: pt  repots "numbness" in B hands, making fine motor tasks and grasp difficult Coordination Gross Motor Movements are Fluid and Coordinated: Yes Fine Motor Movements are Fluid and Coordinated: No Motor  Motor Motor: Within Functional Limits;Other (comment) Motor - Skilled Clinical Observations: Generalized weakness.  Mobility  Bed Mobility Bed Mobility: Supine to Sit Rolling Right: 5: Supervision Rolling Right Details: Verbal cues for technique Rolling Left: 5: Supervision Rolling Left Details: Verbal cues for technique;Verbal cues for precautions/safety Supine to Sit: 5: Supervision Supine to Sit Details: Verbal cues for technique Sit to Supine: 5: Supervision Transfers Sit to Stand: 3: Mod assist Sit to Stand Details: Verbal cues for technique;Verbal cues for precautions/safety;Tactile cues for placement;Tactile cues for weight shifting  Trunk/Postural Assessment  Cervical Assessment Cervical Assessment: Within Functional Limits Thoracic Assessment Thoracic Assessment: Within Functional Limits Lumbar Assessment Lumbar Assessment: Exceptions to [facility] (posterior pelvic tilt) Postural Control Postural Control: Within Functional Limits  Balance Balance Balance Assessed: Yes Static Sitting Balance Static Sitting - Balance Support: No upper extremity supported Static Sitting - Level of Assistance: 6: Modified independent (Device/Increase time) Dynamic Sitting Balance Dynamic Sitting - Balance Support: No upper extremity supported Dynamic Sitting - Level of Assistance: 5: Stand by assistance Static Standing Balance Static Standing - Balance Support: Bilateral upper extremity supported Static Standing - Level of Assistance: 4: Min assist Dynamic Standing Balance Dynamic Standing - Balance Support: Bilateral upper extremity supported;During functional activity Dynamic Standing - Level of Assistance: 2: Max assist Dynamic Standing - Balance Activities: Forward lean/weight  shifting Extremity/Trunk Assessment RUE Assessment RUE Assessment: Exceptions to [facility] RUE Strength RUE Overall Strength Comments: -4/5 grossly, poor grip strength LUE Assessment LUE Assessment: Exceptions to [facility] LUE  Strength LUE Overall Strength Comments: -4/5 grossly,  poor grip strength   See Function Navigator for Current Functional Status.   Refer to Care Plan for Long Term Goals  Recommendations for other services: None  Discharge Criteria: Patient will be discharged from OT if patient refuses treatment 3 consecutive times without medical reason, if treatment goals not met, if there is a change in medical status, if patient makes no progress towards goals or if patient is discharged from hospital.  The above assessment, treatment plan, treatment alternatives and goals were discussed and mutually agreed upon: by patient  Valma Cava 09/05/2015, 3:34 PM

## 2015-09-05 NOTE — IPOC Note (Signed)
Overall Plan of Care [facility]) Patient Details Name: [name] MRN: [id number] DOB: 07-27-1965  Admitting Diagnosis: [facility] Problems: Active Problems:   Debility   Slow transit constipation   Anemia of chronic disease   Other hydronephrosis   Urinary retention   CKD (chronic kidney disease)   Uncontrolled type 2 diabetes mellitus with diabetic nephropathy, without long-term current use of insulin (HCC)   Vasculopathy     Functional Problem List: Nursing Bladder, Bowel, Endurance, Medication Management, Nutrition, Pain, Sensory, Skin Integrity  PT Balance, Endurance, Pain, Safety  OT Balance, Endurance, Motor, Safety, Sensory  SLP    TR         Basic ADL's: OT Eating, Grooming, Bathing, Dressing, Toileting     Advanced  ADL's: OT Simple Meal Preparation     Transfers: PT Bed Mobility, Bed to Chair, Car, [name], Floor  OT Toilet     Locomotion: PT Ambulation, Emergency planning/management officer, Stairs     Additional Impairments: OT    SLP        TR      Anticipated Outcomes Item Anticipated Outcome  Self Feeding Mod I  Swallowing      Basic self-care  Mod I  Toileting  Mod I   Bathroom Transfers Mod I  Bowel/Bladder  Min assist and call for assist  Transfers  Mod I with LRAD.   Locomotion  Mod I with WC and supervision gait for short distances.   Communication     Cognition     Pain  <3 and call for assist  Safety/Judgment  min to mod assist   Therapy Plan: PT Intensity: Minimum of 1-2 x/day ,45 to 90 minutes PT Frequency: 5 out of 7 days PT Duration Estimated Length of Stay: 2 weeks.  OT Intensity: Minimum of 1-2 x/day, 45 to 90 minutes OT Frequency: 5 out of 7 days OT Duration/Estimated Length of Stay: 10-14         Team Interventions: Nursing Interventions Patient/Family Education, Bladder Management, Bowel Management, Disease Management/Prevention, Pain Management, Medication Management, Skin Care/Wound Management, Discharge  Planning, Psychosocial Support  PT interventions Ambulation/gait training, Training and development officer, Cognitive remediation/compensation, Community reintegration, Discharge planning, Disease management/prevention, DME/adaptive equipment instruction, Functional mobility training, Neuromuscular re-education, Pain management, Patient/family education, Psychosocial support, Skin care/wound management, Splinting/orthotics, Stair training, Therapeutic Activities, Therapeutic Exercise, UE/LE Strength taining/ROM, UE/LE Coordination activities, Visual/perceptual remediation/compensation, Wheelchair propulsion/positioning  OT Interventions Training and development officer, Academic librarian, Discharge planning, Engineer, drilling, Functional mobility training, Patient/family education, Self Care/advanced ADL retraining, Therapeutic Activities, Therapeutic Exercise, UE/LE Strength taining/ROM, UE/LE Coordination activities, Wheelchair propulsion/positioning, Visual/perceptual remediation/compensation  SLP Interventions    TR Interventions    SW/CM Interventions Discharge Planning, Psychosocial Support, Patient/Family Education    Team Discharge Planning: Destination: PT-Home ,OT- Home , SLP-  Projected Follow-up: PT-Home health PT, OT-  Home health OT, SLP-  Projected Equipment Needs: PT-To be determined, OT- To be determined, SLP-  Equipment Details: PT-Patient has RW and WC. , OT-  Patient/family involved in discharge planning: PT- Patient,  OT-Patient, SLP-   MD ELOS: 12-15 days. Medical Rehab Prognosis:  Good Assessment: 50 y.o.femalewith history of T2DM with neuropathy, ICH, diabetic foot ulcer s/p R-BKA 7/13, medication non-compliance for past year who was admitted on 08/23/15 with malaise, inability to walk, dyspnea and numbness of her hands. She was found to have multiple skin lesions with metabolic acidosis, severe normocytic-normochromic anemia--hgb 4.8 and thrombocytopenia -  10. She was found to have acute renal failure and renal ultrasound showed  normal kidneys with distended bladder and bilateral hydronephrosis. She was transfused with 2 units PRBC and one unit platelets and started on Levaquin for UTI. Dr [name] consulted for work up and recommended skin biopsy as well as autoimmune studies. Bone marrow biopsy negative and skin bx showing epidermal hyperplasia with fibrosing granulation.  Dr. Justin Mend consulted for input and felt that assumed ARF in setting of bilateral hydronephrosis and BOO. Urine output improved with foley and SCr on downward trend. He felt lesions likely chronic diabetic lesions and recommendedGUconsult. Repeat ultrasound 8/27 showed moderate bilateral hydronephrosis. CT abdomen/pelvis done due to concerns of rectovaginal fistula. This showed diffuse anasarca, no evidence of fistula and moderately enlarged bilateral inguinal lymph nodes.   Dr.Herrick evaluated patient and felt that urinary retention has been chronic leading to ureteral reflux and hydronephrosis. He felt that hydronephrosis had improved with diuresis/foley and that ureteral stents not recommended. Hydronephrosis should improve over next few weeks with diuresis--to repeat voiding trial and to educate patient on in and out caths v/s indwelling foley. She continues to have impressive UOP with downward trend in Scr- 2.05 and weight to 101 kg. Skin Biopsy revealed epidermal hyperplasia with fibrosing granulation tissue. She continues to have persistent coagulopathy. Heme/Onc recommends erythropoietin/iron for anemia and questions presence of lupus anticoagulant. To transfuse as needed for platelets less than 10K. Pt with resulting functional deficits with endurance, mobility exacerbated by ill-fitting prosthesis. Will set goals for Mod I with therapies.    See Team Conference Notes for weekly updates to the plan of care

## 2015-09-05 NOTE — Progress Notes (Signed)
Ankit Lorie Phenix, MD Physician Signed Physical Medicine and Rehabilitation  Consult Note Date of Service: 09/02/2015 8:30 AM  Related encounter: ED to Hosp-Admission (Discharged) from 08/23/2015 in [place] Collapse All   _0 Hide copied text _1 Hover for attribution information      Physical Medicine and Rehabilitation Consult  Reason for Consult: Debility in patient with severe anemia and R-BKA in setting of peripheral neuropathy Referring Physician: Dr. Thereasa Solo.    HPI: [name] is a 50 y.o. female with history of T2DM with neuropathy, ICH, diabetic foot ulcer s/p R-BKA 7/13, medication non-compliance for past year who was admitted on 08/23/15 with malaise, inability to walk, dyspnea and numbness of her hands.  She was found to have multiple skin lesions with metabolic acidosis, severe normocytic-normochromic anemia--hgb 4.8 and thrombocytopenia - 10.   She was found to have acute renal failure and renal ultrasound showed normal kidneys with distended bladder and bilateral hydronephrosis. She was transfused with 2 units PRBC and one unit platelets and started on Levaquin for UTI. Dr [name] consulted for work up and recommended skin biopsy as well as autoimmune studies. Bone marrow biopsy negative and skin bx path pending.  Dr. Justin Mend consulted for input and felt that assumed ARF in setting of bilateral hydronephrosis and BOO. Urine output improved with foley and SCr on downward trend. He felt lesions likely chronic diabetic lesions and recommends GU consult as well as repeat renal ultrasound today.  PT evaluation done showing problems with mobilty and CIR recommended for follow up therapy.   Was in rehab in 2013 and discharged with follow up therapy which was declined.  Was able to take a couple of hopping steps till few weeks ago--started having to slide her leg due to worsening of neuropathic symptoms.   Review of Systems    HENT: Negative for hearing loss.   Eyes: Negative for blurred vision and double vision.  Respiratory: Negative for cough and shortness of breath.   Cardiovascular: Negative for chest pain and palpitations.  Gastrointestinal: Positive for vomiting (daily for past 2 months. ). Negative for abdominal pain, constipation, heartburn and nausea.  Genitourinary: Negative for dysuria and frequency.  Musculoskeletal: Positive for back pain (chronic low back pain). Negative for falls, joint pain and neck pain.  Neurological: Positive for sensory change (LLE--totally numb for past year. Numbness bilateral hands for past 2 weeks.). Negative for headaches.  Psychiatric/Behavioral: Negative for memory loss.  All other systems reviewed and are negative.         Past Medical History:  Diagnosis Date  . Arthritis   . Diabetes mellitus   . Diabetic foot ulcer associated with type 2 diabetes mellitus ([place])   . Hypertension   . Intracerebral hemorrhage ([place]) As a teenager    States she had burst blood vessel as teenager, now with resultant minor visual field and hearing deficits  . Neuropathy ([place])   . Stroke [facility])          Past Surgical History:  Procedure Laterality Date  . AMPUTATION  11/24/2010   Procedure: AMPUTATION FOOT;  Surgeon: Wylene Simmer, MD;  Location: [place];  Service: Orthopedics;  Laterality: Right;  Right  FOOT TRANS METATARSAL AMPUTATION  . AMPUTATION  07/02/2011   Procedure: AMPUTATION BELOW KNEE;  Surgeon: Wylene Simmer, MD;  Location: [place];  Service: Orthopedics;  Laterality: Right;  . APPLICATION OF WOUND VAC  07/02/2011   Procedure: APPLICATION OF WOUND VAC;  Surgeon: Wylene Simmer, MD;  Location: [place];  Service: Orthopedics;  Laterality: Right;  . BRAIN SURGERY  1980's   Previous brain surgery in [place], [place]  . CESAREAN SECTION    . I&D EXTREMITY  11/24/2010   Procedure: IRRIGATION AND DEBRIDEMENT EXTREMITY;  Surgeon: Wylene Simmer, MD;  Location: [place];   Service: Orthopedics;  Laterality: Left;  Debriedment of left plantar ulcer and trimming of toenails  . I&D EXTREMITY  07/02/2011   Procedure: IRRIGATION AND DEBRIDEMENT EXTREMITY;  Surgeon: Wylene Simmer, MD;  Location: [place];  Service: Orthopedics;  Laterality: Left;  I & D of Left Foot  . I&D EXTREMITY  07/06/2011   Procedure: IRRIGATION AND DEBRIDEMENT EXTREMITY;  Surgeon: Wylene Simmer, MD;  Location: [place];  Service: Orthopedics;  Laterality: Right;  IRRIGATION/DEBRIDEMENT RIGHT BELOW KNEE AMPUTATION          Family History  Problem Relation Age of Onset  . Diabetes Mother   . Hypertension Mother   . Hyperlipidemia Mother   . Diabetes Father   . Cancer Father     prostate  . Hyperlipidemia Father   . Hypertension Father   . Diabetes Brother   . Peripheral Artery Disease Brother     has had toes amputated    Social History:   Lives with two children--mother from [place] with children at this time.  Independent at wheelchair level. reports that she has never smoked. She has never used smokeless tobacco. She reports that she does not drink alcohol or use drugs.    Allergies: No Known Allergies          Medications Prior to Admission  Medication Sig Dispense Refill  . aspirin 81 MG EC tablet Take 162-243 mg by mouth 3 (three) times daily as needed (chest pain). Swallow whole.    . Aspirin-Acetaminophen-Caffeine (GOODY HEADACHE PO) Take 1 packet by mouth 2 (two) times daily as needed (headache).    . Menthol, Topical Analgesic, (ICY HOT EX) Apply 1 application topically 2 (two) times daily as needed (pain).    . Naphazoline HCl (CLEAR EYES OP) Place 1 drop into both eyes daily as needed (dry eyes/itching).    . naproxen sodium (ALEVE) 220 MG tablet Take 440 mg by mouth 2 (two) times daily as needed (pain).    Marland Kitchen amLODipine (NORVASC) 10 MG tablet Take 1 tablet (10 mg total) by mouth daily. 30 tablet 0  . insulin aspart (NOVOLOG) 100 UNIT/ML  injection Inject 3 Units into the skin 3 (three) times daily with meals. 1 vial 0    Home: Home Living Family/patient expects to be discharged to:: Private residence Living Arrangements: Children (66 yo and 9 yo) Available Help at Discharge: Family (Mom is in town from [place] to help.) Type of Home: House Home Access: Stairs to enter Technical brewer of Steps: 1 Entrance Stairs-Rails: None Home Layout: Two level, Able to live on main level with bedroom/bathroom Home Equipment: Environmental consultant - 2 wheels, Wheelchair - manual  Functional History: Prior Function Level of Independence: Independent with assistive device(s) Comments: Patient does not use her prosthesis.  Uses w/c for mobility, and walker for bathroom.  Uses cabs for transportation, but having trouble getting into/out of cab with weakness. Functional Status:  Mobility: Bed Mobility Overal bed mobility: Needs Assistance Bed Mobility: Supine to Sit Supine to sit: HOB elevated, Min assist Sit to supine: Min assist, HOB elevated General bed mobility comments: Assist to elevate trunk and bring hips to EOB Transfers Overall transfer  level: Needs assistance Equipment used: Rolling walker (2 wheeled), Ambulation equipment used Transfer via Lift Equipment: Stedy Transfers: Sit to/from Stand, Risk manager Sit to Stand: +2 physical assistance, Mod assist Stand pivot transfers: +2 physical assistance, Max assist (Use of Denna Haggard) General transfer comment: Assist to bring hips and trunk up. Pt stood with walker for ~30-45 sec. Unsure it pt could perform pivot with walker so returned to sitting EOB and brought Denna Haggard in to use for the stand pivot.      ADL:    Cognition: Cognition Overall Cognitive Status: Within Functional Limits for tasks assessed Orientation Level: Oriented X4 Cognition Arousal/Alertness: Awake/alert Behavior During Therapy: Anxious Overall Cognitive Status: Within Functional Limits for  tasks assessed  Blood pressure 124/68, pulse 90, temperature 99.5 F (37.5 C), temperature source Oral, resp. rate (!) 24, height _0  (1.702 m), weight 107 kg (235 lb 14.3 oz), SpO2 96 %. Physical Exam  Nursing note and vitals reviewed. Constitutional: She is oriented to person, place, and time. She appears well-developed and well-nourished.  Obese  HENT:  Head: Normocephalic and atraumatic.  Mouth/Throat: Oropharynx is clear and moist.  Eyes: Conjunctivae and EOM are normal. Pupils are equal, round, and reactive to light.  Neck: Normal range of motion. Neck supple.  Cardiovascular: Normal rate and regular rhythm.   No murmur heard. Respiratory: Effort normal and breath sounds normal.  GI: Soft. Bowel sounds are normal. She exhibits no distension. There is no tenderness.  Musculoskeletal: She exhibits edema. She exhibits no tenderness.  R-BKA well healed. No edema  Neurological: She is alert and oriented to person, place, and time.  Speech clear and follows commands without difficulty.   Sensory deficits LLE, right residual limb and bilateral hands.  Motor: B/l UE: 4/5 proximal to distal LLE: 4/5 hip flexion, knee extension, 4+/5 ankle dorsi/plantar flexion RLE: 4+/5 hip flexion, knee extension  Skin: Skin is warm and dry. Rash noted.  Diffuse skin lesions  Psychiatric: She has a normal mood and affect. Her behavior is normal. Thought content normal.    Lab Results Last 24 Hours       Results for orders placed or performed during the hospital encounter of 08/23/15 (from the past 24 hour(s))  Glucose, capillary     Status: Abnormal   Collection Time: 09/01/15  8:46 AM  Result Value Ref Range   Glucose-Capillary 135 (H) 65 - 99 mg/dL   Comment 1 Notify RN    Comment 2 Document in Chart   Glucose, capillary     Status: None   Collection Time: 09/01/15 11:55 AM  Result Value Ref Range   Glucose-Capillary 95 65 - 99 mg/dL   Comment 1 Notify RN    Comment 2  Document in Chart   Glucose, capillary     Status: Abnormal   Collection Time: 09/01/15  4:28 PM  Result Value Ref Range   Glucose-Capillary 120 (H) 65 - 99 mg/dL   Comment 1 Notify RN    Comment 2 Document in Chart   Glucose, capillary     Status: Abnormal   Collection Time: 09/01/15  6:04 PM  Result Value Ref Range   Glucose-Capillary 128 (H) 65 - 99 mg/dL  Glucose, capillary     Status: Abnormal   Collection Time: 09/01/15  9:07 PM  Result Value Ref Range   Glucose-Capillary 103 (H) 65 - 99 mg/dL  Magnesium     Status: None   Collection Time: 09/02/15  4:27 AM  Result Value Ref Range   Magnesium 2.1 1.7 - 2.4 mg/dL  CBC with Differential/Platelet     Status: Abnormal   Collection Time: 09/02/15  4:27 AM  Result Value Ref Range   WBC 6.6 4.0 - 10.5 K/uL   RBC 2.57 (L) 3.87 - 5.11 MIL/uL   Hemoglobin 7.1 (L) 12.0 - 15.0 g/dL   HCT 23.1 (L) 36.0 - 46.0 %   MCV 89.9 78.0 - 100.0 fL   MCH 27.6 26.0 - 34.0 pg   MCHC 30.7 30.0 - 36.0 g/dL   RDW 17.0 (H) 11.5 - 15.5 %   Platelets 91 (L) 150 - 400 K/uL   Neutrophils Relative % 72 %   Neutro Abs 4.7 1.7 - 7.7 K/uL   Lymphocytes Relative 17 %   Lymphs Abs 1.1 0.7 - 4.0 K/uL   Monocytes Relative 7 %   Monocytes Absolute 0.5 0.1 - 1.0 K/uL   Eosinophils Relative 3 %   Eosinophils Absolute 0.2 0.0 - 0.7 K/uL   Basophils Relative 1 %   Basophils Absolute 0.0 0.0 - 0.1 K/uL  Lactate dehydrogenase     Status: None   Collection Time: 09/02/15  4:27 AM  Result Value Ref Range   LDH 161 98 - 192 U/L  Renal function panel     Status: Abnormal   Collection Time: 09/02/15  4:27 AM  Result Value Ref Range   Sodium 140 135 - 145 mmol/L   Potassium 4.6 3.5 - 5.1 mmol/L   Chloride 108 101 - 111 mmol/L   CO2 25 22 - 32 mmol/L   Glucose, Bld 99 65 - 99 mg/dL   BUN 30 (H) 6 - 20 mg/dL   Creatinine, Ser 2.35 (H) 0.44 - 1.00 mg/dL   Calcium 7.9 (L) 8.9 - 10.3 mg/dL   Phosphorus 3.6 2.5 - 4.6  mg/dL   Albumin 2.4 (L) 3.5 - 5.0 g/dL   GFR calc non Af Amer 23 (L) >60 mL/min   GFR calc Af Amer 27 (L) >60 mL/min   Anion gap 7 5 - 15  Glucose, capillary     Status: None   Collection Time: 09/02/15  8:19 AM  Result Value Ref Range   Glucose-Capillary 99 65 - 99 mg/dL   Comment 1 Notify RN    Comment 2 Document in Chart       Imaging Results (Last 48 hours)  US Renal  Result Date: 08/31/2015 CLINICAL DATA:  Acute renal failure. EXAM: RENAL / URINARY TRACT ULTRASOUND COMPLETE COMPARISON:  CT of the abdomen pelvis 08/29/2015 FINDINGS: Right Kidney: Length: 12.8 cm.  There is moderate hydronephrosis. Left Kidney: Length: 12.2 cm.  There is moderate hydronephrosis. Bladder: Urinary bladder is poorly visualized as it is decompressed around urinary Foley catheter. IMPRESSION: Bilateral moderate hydronephrosis without evidence of cortical thinning. Electronically Signed   By: Fidela Salisbury M.D.   On: 08/31/2015 14:49     Assessment/Plan: Diagnosis: Debility  Labs and images independently reviewed.  Records reviewed and summated above.  1. Does the need for close, 24 hr/day medical supervision in concert with the patient's rehab needs make it unreasonable for this patient to be served in a less intensive setting? Yes  2. Co-Morbidities requiring supervision/potential complications: K0XF with neuropathy (Monitor in accordance with exercise and adjust meds as necessary), hx of R-BKA (adjust prosthesis), medication non-compliance (educate), multiple skin lesions (follow bx results), UTI (cont meds), tachypnea (monitor RR and O2 Sats with increased physical exertion), AKI/?CKD (avoid nephrotoxic meds),  hypoalbuminemia (protein supplementation), ABLA on chronic anemia (transfuse again if necessary to ensure appropriate perfusion for increased activity tolerance), Thrombocytopenia (< 60,000/mm3 no resistive exercise), neurogenic bladder (timed voids, medications if necessary, I/O  cath vs. Indwelling foley) 3. Due to bladder management, safety, skin/wound care, disease management and patient education, does the patient require 24 hr/day rehab nursing? Yes 4. Does the patient require coordinated care of a physician, rehab nurse, PT (1-2 hrs/day, 5 days/week) and OT (1-2 hrs/day, 5 days/week) to address physical and functional deficits in the context of the above medical diagnosis(es)? Yes Addressing deficits in the following areas: balance, endurance, locomotion, strength, transferring, bowel/bladder control, toileting and psychosocial support 5. Can the patient actively participate in an intensive therapy program of at least 3 hrs of therapy per day at least 5 days per week? Potentially 6. The potential for patient to make measurable gains while on inpatient rehab is excellent 7. Anticipated functional outcomes upon discharge from inpatient rehab are supervision and min assist  with PT, supervision and min assist with OT, n/a with SLP. 8. Estimated rehab length of stay to reach the above functional goals is: 16-19 days. 9. Does the patient have adequate social supports and living environment to accommodate these discharge functional goals? No 10. Anticipated D/C setting: Home 11. Anticipated post D/C treatments: HH therapy and Home excercise program 12. Overall Rehab/Functional Prognosis: good  RECOMMENDATIONS: This patient's condition is appropriate for continued rehabilitative care in the following setting: Would consider CIR consult to improve mobility and strength prior to discharge, if pt has caregiver support.  Currently, pt living with children, who attend school during the day. Will await medical workup and demonstrate ability to tolerate 3 hours therapy/day.  Patient has agreed to participate in recommended program. Yes Note that insurance prior authorization may be required for reimbursement for recommended care.  Comment: Rehab Admissions Coordinator to follow  up.  Delice Lesch, MD 09/02/2015    Revision History                        Routing History

## 2015-09-06 ENCOUNTER — Inpatient Hospital Stay (HOSPITAL_COMMUNITY): Payer: Medicaid Other | Admitting: Occupational Therapy

## 2015-09-06 DIAGNOSIS — R5381 Other malaise: Principal | ICD-10-CM

## 2015-09-06 DIAGNOSIS — N189 Chronic kidney disease, unspecified: Secondary | ICD-10-CM

## 2015-09-06 DIAGNOSIS — D638 Anemia in other chronic diseases classified elsewhere: Secondary | ICD-10-CM

## 2015-09-06 LAB — GLUCOSE, CAPILLARY
Glucose-Capillary: 111 mg/dL — ABNORMAL HIGH (ref 65–99)
Glucose-Capillary: 114 mg/dL — ABNORMAL HIGH (ref 65–99)
Glucose-Capillary: 95 mg/dL (ref 65–99)
Glucose-Capillary: 115 mg/dL — ABNORMAL HIGH (ref 65–99)

## 2015-09-06 MED ORDER — CARVEDILOL 6.25 MG PO TABS
6.2500 mg | ORAL_TABLET | Freq: Two times a day (BID) | ORAL | Status: DC
  Administered 2015-09-06 – 2015-09-07 (×3): 6.25 mg via ORAL
  Filled 2015-09-06 (×4): qty 1

## 2015-09-06 MED ORDER — SORBITOL 70 % SOLN
60.0000 mL | Status: AC
  Administered 2015-09-06: 60 mL via ORAL
  Filled 2015-09-06: qty 60

## 2015-09-06 NOTE — Progress Notes (Signed)
S: Doing well in rehab  O:BP (!) 172/88 (BP Location: Left Arm)   Pulse 96   Temp 98.7 F (37.1 C) (Oral)   Resp 18   Ht _0  (1.702 m)   Wt 97.7 kg (215 lb 6.4 oz)   LMP  (LMP Unknown)   SpO2 92%   BMI 33.74 kg/m   Intake/Output Summary (Last 24 hours) at 09/06/15 1152 Last data filed at 09/06/15 0518  Gross per 24 hour  Intake              600 ml  Output             3475 ml  Net            -2875 ml   Weight change: 87.5 kg (192 lb 14.4 oz) Gen: sleeping but easily arousable CVS:RRR Resp:Clear Abd:+ BS NTND Ext:Rt BKA.  No edema.  Mult chronic lesions over arms/legs/torso NEURO: AAO x 3 GU clear pale yellow urine in Foley   . calcium carbonate  2 tablet Oral TID  . ferric gluconate (FERRLECIT/NULECIT) IV  125 mg Intravenous QODAY  . insulin aspart  0-20 Units Subcutaneous TID WC  . insulin aspart  0-5 Units Subcutaneous QHS  . prochlorperazine  5 mg Oral TID AC  . senna-docusate  2 tablet Oral BID   No results found. BMET    Component Value Date/Time   NA 136 09/05/2015 0823   K 3.9 09/05/2015 0823   CL 104 09/05/2015 0823   CO2 25 09/05/2015 0823   GLUCOSE 141 (H) 09/05/2015 0823   BUN 15 09/05/2015 0823   CREATININE 1.79 (H) 09/05/2015 0823   CALCIUM 9.1 09/05/2015 0823   GFRNONAA 32 (L) 09/05/2015 0823   GFRAA 38 (L) 09/05/2015 0823   CBC    Component Value Date/Time   WBC 7.8 09/05/2015 0823   RBC 3.28 (L) 09/05/2015 0823   HGB 8.9 (L) 09/05/2015 0823   HCT 29.2 (L) 09/05/2015 0823   HCT 20.8 (L) 08/26/2015 1436   PLT 148 (L) 09/05/2015 0823   MCV 89.0 09/05/2015 0823   MCH 27.1 09/05/2015 0823   MCHC 30.5 09/05/2015 0823   RDW 15.8 (H) 09/05/2015 0823   LYMPHSABS 1.0 09/05/2015 0823   MONOABS 0.3 09/05/2015 0823   EOSABS 0.0 09/05/2015 0823   BASOSABS 0.0 09/05/2015 0823     Assessment/ Plan:  1. ARF in setting of Bil hydronephrosis,  BOO as UO excellent after foley placement and Scr cont to decrease.  Repeat renal US showing  persistence of bilateral hydro- would have expected this to improve more but pt has robust urine output with downtrending creatinine.  Urology consulted as well, appreciate recs.  Will need repeat imaging after post-obstructive diuresis (in a few weeks).  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    2. Anemia/thrombocytopenia of unclear etiology.  BM biopsy has been unrevealing. Iron % sat is 15 with worsening anemia.  Repleting iron stores with Ferric gluconate IV (started 8/29), Aranesp 60 mcg given  as well (8/29)  Hemoglobin is improving.  3. DM : likely causing a neurogenic bladder, has neuropathy as well.  She may need to self-cath after the Foley comes out.  4.  Chronic diabetic skin lesions, biopsy reveals hyperplasia seen in prurigo and stasis-like change 5. Met acidosis, resolved 6. Hypocalcemia on oral replacement 7. Hypomag 8. Hypokalemia, resolved 9.  HTN: starting carvedilol 6.25 mg BID  Perseus Westall  [facility] pgr [phone] cell: [phone]

## 2015-09-06 NOTE — Progress Notes (Signed)
[place] PHYSICAL MEDICINE & REHABILITATION     PROGRESS NOTE  Subjective/Complaints:  Complains of constiaption. Nauseas still at times. ROS:  +Nausea/vomitting, sleep disturbance, numbness hand/feet.  Denies CP, SOB.  Objective: Vital Signs: Blood pressure (!) 172/88, pulse 96, temperature 98.7 F (37.1 C), temperature source Oral, resp. rate 18, height 5\' 7"  (1.702 m), weight 97.7 kg (215 lb 6.4 oz), SpO2 92 %. No results found.  Recent Labs  09/04/15 0336 09/05/15 0823  WBC 7.3 7.8  HGB 7.9* 8.9*  HCT 25.8* 29.2*  PLT 102* 148*    Recent Labs  09/04/15 0338 09/05/15 0823  NA 138 136  K 4.8 3.9  CL 106 104  GLUCOSE 157* 141*  BUN 22* 15  CREATININE 2.05* 1.79*  CALCIUM 8.8* 9.1   CBG (last 3)   Recent Labs  09/05/15 1640 09/05/15 2110 09/06/15 0639  GLUCAP 130* 108* 111*    Wt Readings from Last 3 Encounters:  09/06/15 97.7 kg (215 lb 6.4 oz)  09/03/15 101 kg (222 lb 10.6 oz)  12/14/12 90.7 kg (200 lb)    Physical Exam:  BP (!) 172/88 (BP Location: Left Arm)   Pulse 96   Temp 98.7 F (37.1 C) (Oral)   Resp 18   Ht 5\' 7"  (1.702 m)   Wt 97.7 kg (215 lb 6.4 oz)   LMP  (LMP Unknown)   SpO2 92%   BMI 33.74 kg/m  Constitutional: She appears well-developed and well-nourished. Obese  HENT: Normocephalic and atraumatic.  Eyes: Conjunctivae and EOM are normal.  Cardiovascular: Normal rate and regular rhythm.  No murmur heard. Respiratory: Effort normal and breath sounds normal.  GI: Soft. Bowel sounds are normal. She exhibits no distension. There is no tenderness.  Musculoskeletal: She exhibits edema. She exhibits no tenderness.  R-BKA well healed. Neurological: She is alert and oriented.  Speech clear and follows commands without difficulty.   Sensory deficits LLE, right residual limb and bilateral hands.  Motor: B/l UE: 4/5 proximal to distal LLE: 4/5 hip flexion, knee extension, 4+/5 ankle dorsi/plantar flexion RLE: 4+/5 hip flexion, knee  extension  Skin: Skin is warm and dry. Diffuse skin lesions, gluteal ulcer not examined today. Psychiatric: She has flat mood and affect. Her behavior is normal. Thought content normal.   Assessment/Plan: 1. Functional deficits secondary to debility which require 3+ hours per day of interdisciplinary therapy in a comprehensive inpatient rehab setting. Physiatrist is providing close team supervision and 24 hour management of active medical problems listed below. Physiatrist and rehab team continue to assess barriers to discharge/monitor patient progress toward functional and medical goals.  Function:  Bathing Bathing position   Position: Wheelchair/chair at sink  Bathing parts Body parts bathed by patient: Right arm, Left arm, Chest, Abdomen, Left upper leg, Right upper leg Body parts bathed by helper: Front perineal area, Buttocks, Left lower leg, Back  Bathing assist Assist Level: Touching or steadying assistance(Pt > 75%)      Upper Body Dressing/Undressing Upper body dressing   What is the patient wearing?: Hospital gown                Upper body assist Assist Level: Supervision or verbal cues, Set up   Set up : To obtain clothing/put away  Lower Body Dressing/Undressing Lower body dressing   What is the patient wearing?: Pants, Non-skid slipper socks, Ted Hose     Pants- Performed by patient: Thread/unthread right pants leg Pants- Performed by helper: Thread/unthread left pants leg, Pull  pants up/down   Non-skid slipper socks- Performed by helper: Don/doff right sock               TED Hose - Performed by helper: Don/doff right TED hose  Lower body assist Assist for lower body dressing: Touching or steadying assistance (Pt > 75%)      Toileting Toileting Toileting activity did not occur: No continent bowel/bladder event        Toileting assist     Transfers Chair/bed transfer   Chair/bed transfer method: Stand pivot Chair/bed transfer assist level:  Maximal assist (Pt 25 - 49%/lift and lower) Chair/bed transfer assistive device: Armrests     Locomotion Ambulation Ambulation activity did not occur: Safety/medical concerns         Wheelchair   Type: Manual Max wheelchair distance: 15450ft  Assist Level: Supervision or verbal cues  Cognition Comprehension Comprehension assist level: Understands complex 90% of the time/cues 10% of the time  Expression Expression assist level: Expresses complex 90% of the time/cues < 10% of the time  Social Interaction Social Interaction assist level: Interacts appropriately 90% of the time - Needs monitoring or encouragement for participation or interaction.  Problem Solving Problem solving assist level: Solves basic 90% of the time/requires cueing < 10% of the time  Memory Memory assist level: Recognizes or recalls 90% of the time/requires cueing < 10% of the time      Medical Problem List and Plan: 1. Deconditioning secondary to multiple medical issues including severe anemia, acute renal failure  continue CIR 2. DVT Prophylaxis/Anticoagulation: Mechanical: Sequential compression devices, below knee Bilateral lower extremities 3. Pain Management: Norco prn for chronic back pain 4. Mood: Team to provide ego support. LCSW to follow for evaluation and support.  5. Neuropsych: This patient iscapable of making decisions on herown behalf. 6. Skin/Wound Care: Aquacel Silver for MASD gluteal cleft.  7. Fluids/Electrolytes/Nutrition:   Scheduled compazine before meals for ongoing nausea, ?gastroparesis.   -add megace for appetite 400mg  daily 10 T2DM with neuropathy, nephropathy, vasculopathy and ?gastroparesis:   Monitor BS ac/hs.   SSI for elevated BS.  11. CKD: Avoid NSAIDs and other nephrotoxic drugs.   Hypocalcemia managed with tums. Monitor weights daily.   Will plan to d/c IVF in near future 12. Bilateral hydronephrosis due to chronic urinary retention:   Will d/c foley in near future,  currently significant outpt   Voiding trial as output decreases.  13. Anemia of chronic disease: On IV iron for supplement--received dose of Aransep on 8/29.   Hb up to 8.9 on 9/1 14. Thrombocytopenia: Monitor with serial checks  Plts up to 148 on 9/1 15. Constipation  Bowel reg increased on 9/1  -sorbitol today  LOS (Days) 2 A FACE TO FACE EVALUATION WAS PERFORMED  [name] 09/06/2015 10:16 AM

## 2015-09-06 NOTE — Progress Notes (Signed)
Occupational Therapy Session Note  Patient Details  Name: [name] MRN: [id number] Date of Birth: 08/17/1965  Today's Date: 09/06/2015 OT Individual Time: [id number] OT Individual Time Calculation (min): 38 min     Short Term Goals: Week 1:  OT Short Term Goal 1 (Week 1): Pt will transfer to toilet using LRD with min A OT Short Term Goal 2 (Week 1): Pt will complete LB dressing with Min A OT Short Term Goal 3 (Week 1): Pt will complete bathing with Min A OT Short Term Goal 4 (Week 1): Pt will maintain standing balance for 3 minutes with Min A and RW while reaching within base of support.  Skilled Therapeutic Interventions/Progress Updates:   Pt participated in skilled OT session focusing on activity tolerance, desensitization of B UEs, and safety with self care mgt retraining. Pt reported having bowel accident upon skilled OT arrival with soiled backside and bedding. Pt was provided gloves and assisted skilled OT with completing toileting hygiene and donning brief in bed with instruction on rolling. Nursing notified to change sacral bandages. After pt was cleaned up and bandages were changed, LB dressing was completed at EOB with use of STEDY lift and 2 helpers. Pt completed sit<stand in STEDY with Mod A and remained standing for 4 minutes. Max A for footwear including TEDs and sock. Pt was transferred to w/c with STEDY and 2 helpers for maneuvering IV line. Pt completed UB dressing when seated in w/c with supervision after assistance for line mgt. Pt left in w/c at end of session with all needs within reach. No c/o pain.   Therapy Documentation Precautions:  Precautions Precautions: Fall Precaution Comments: R BKA 2013 Restrictions Weight Bearing Restrictions: No General:   Vital Signs: Therapy Vitals Temp: 98.4 F (36.9 C) Temp Source: Oral Pulse Rate: 93 Resp: 18 BP: (!) 155/72 Patient Position (if appropriate): Sitting Oxygen Therapy SpO2: 100 % O2 Device: Not  Delivered Pain:   ADL: ADL ADL Comments: see functional navigator Exercises:   Other Treatments:    See Function Navigator for Current Functional Status.   Therapy/Group: Individual Therapy  Taniya Dasher A Allison Deshotels 09/06/2015, 4:37 PM

## 2015-09-07 ENCOUNTER — Inpatient Hospital Stay (HOSPITAL_COMMUNITY): Payer: Medicaid Other | Admitting: Physical Therapy

## 2015-09-07 ENCOUNTER — Inpatient Hospital Stay (HOSPITAL_COMMUNITY): Payer: Medicaid Other | Admitting: Occupational Therapy

## 2015-09-07 DIAGNOSIS — R339 Retention of urine, unspecified: Secondary | ICD-10-CM

## 2015-09-07 LAB — GLUCOSE, CAPILLARY
Glucose-Capillary: 89 mg/dL (ref 65–99)
Glucose-Capillary: 114 mg/dL — ABNORMAL HIGH (ref 65–99)
Glucose-Capillary: 91 mg/dL (ref 65–99)
Glucose-Capillary: 115 mg/dL — ABNORMAL HIGH (ref 65–99)

## 2015-09-07 NOTE — Progress Notes (Signed)
S: Got a bath today  O:BP 132/72   Pulse 90   Temp 98.4 F (36.9 C) (Oral)   Resp 18   Ht '5\' 7"'  (1.702 m)   Wt 97.7 kg (215 lb 6.4 oz)   LMP  (LMP Unknown)   SpO2 96%   BMI 33.74 kg/m   Intake/Output Summary (Last 24 hours) at 09/07/15 1026 Last data filed at 09/07/15 0827  Gross per 24 hour  Intake              360 ml  Output             6700 ml  Net            -6340 ml   Weight change:  Gen: sitting in wheelchair, smiling CVS:RRR Resp:Clear Abd:+ BS NTND Ext:Rt BKA.  No edema.  Mult chronic lesions over arms/legs/torso NEURO: AAO x 3 GU clear pale yellow urine in Foley   . calcium carbonate  2 tablet Oral TID  . carvedilol  6.25 mg Oral BID WC  . ferric gluconate (FERRLECIT/NULECIT) IV  125 mg Intravenous QODAY  . insulin aspart  0-20 Units Subcutaneous TID WC  . insulin aspart  0-5 Units Subcutaneous QHS  . prochlorperazine  5 mg Oral TID AC  . senna-docusate  2 tablet Oral BID   No results found. BMET    Component Value Date/Time   NA 136 09/05/2015 0823   K 3.9 09/05/2015 0823   CL 104 09/05/2015 0823   CO2 25 09/05/2015 0823   GLUCOSE 141 (H) 09/05/2015 0823   BUN 15 09/05/2015 0823   CREATININE 1.79 (H) 09/05/2015 0823   CALCIUM 9.1 09/05/2015 0823   GFRNONAA 32 (L) 09/05/2015 0823   GFRAA 38 (L) 09/05/2015 0823   CBC    Component Value Date/Time   WBC 7.8 09/05/2015 0823   RBC 3.28 (L) 09/05/2015 0823   HGB 8.9 (L) 09/05/2015 0823   HCT 29.2 (L) 09/05/2015 0823   HCT 20.8 (L) 08/26/2015 1436   PLT 148 (L) 09/05/2015 0823   MCV 89.0 09/05/2015 0823   MCH 27.1 09/05/2015 0823   MCHC 30.5 09/05/2015 0823   RDW 15.8 (H) 09/05/2015 0823   LYMPHSABS 1.0 09/05/2015 0823   MONOABS 0.3 09/05/2015 0823   EOSABS 0.0 09/05/2015 0823   BASOSABS 0.0 09/05/2015 0823     Assessment/ Plan:  1. ARF in setting of Bil hydronephrosis improving,  BOO as UO excellent after foley placement and Scr cont to decrease.  Repeat renal US showing persistence of  bilateral hydro- would have expected this to improve more but pt has robust urine output with downtrending creatinine.  Urology consulted as well, appreciate recs.  Will need repeat imaging after post-obstructive diuresis (in a few weeks).  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    2. Anemia/thrombocytopenia of unclear etiology.  BM biopsy has been unrevealing. Iron % sat is 15 with worsening anemia.  Repleting iron stores with Ferric gluconate IV (started 8/29), Aranesp 60 mcg given  as well (8/29)  Hemoglobin is improving.  3. DM : likely causing a neurogenic bladder, has neuropathy as well.  She may need to self-cath after the Foley comes out.  4.  Chronic diabetic skin lesions, biopsy reveals hyperplasia seen in prurigo and stasis-like change 5. Met acidosis, resolved 6. Hypocalcemia on oral replacement 7. Hypomag 8. Hypokalemia, resolved 9.  HTN: starting carvedilol 6.25 mg BID with improving blood  pressures.  Will uptitrate as needed.  Madelon Lips  [facility] pgr [phone] cell: [phone]

## 2015-09-07 NOTE — Progress Notes (Signed)
Complained of chest hurting. Chest tender/sore to touch. Reports pain, "comes and goes." Paged Dr. Riley KillSwartz, orders to apply heat. PRN vicodin given. Currently resting without complaint of. Alfredo MartinezMurray, Arush Gatliff A

## 2015-09-07 NOTE — Progress Notes (Signed)
Occupational Therapy Session Note  Patient Details  Name: [name] MRN: [id number] Date of Birth: 09/24/1965  Today's Date: 09/07/2015 OT Individual Time:  - 0930- 1100  (90 min)  1st session                                        1300-1400  ()60 min)  2nd session       Short Term Goals: Week 1:  OT Short Term Goal 1 (Week 1): Pt will transfer to toilet using LRD with min A OT Short Term Goal 2 (Week 1): Pt will complete LB dressing with Min A OT Short Term Goal 3 (Week 1): Pt will complete bathing with Min A OT Short Term Goal 4 (Week 1): Pt will maintain standing balance for 3 minutes with Min A and RW while reaching within base of support. Week 2:     Skilled Therapeutic Interventions/Progress Updates:    1st session:  Pt sitting in wc eating breakfast.  She reported having earlier therapy and not eating.  [name] completed breakfast.  Reported BUE numbness and tingling.  Went over exercises for nerve gliding for hands.  [name] bathed at sink with set up and cues for organization.  Used stedy to come to standing for peri care.  [name] moving slowly and fearful of activity.  Pt stood in stedy x2 for 2 minute increments.  Performed peri care with mod assist.  Pt had blood in groom area due to skin tear.  Informed RN.  Performed grooming at sink and left in wc with all needs in reach.     2nd session:  [name] sitting in wc finishing lunch. [name] reported feeling tired.  She also reported that she stays inside all the time when at home and rely on her children to get her in and out of house. Pt assisted with pushing IV to gym.  Engaged in UE AROM using 2# weights, theraband, and weighted bar.  Instructed pt on diaphragmatic breathing for better breath control and core engagement rather than compensatory movements.    Therapy Documentation Precautions:  Precautions Precautions: Fall Precaution Comments: R BKA 2013 Restrictions Weight Bearing Restrictions: No General:   Vital Signs: Therapy  Vitals Pulse Rate: 90 BP: 132/72 Pain:  Fatigue but no pain   ADL: ADL ADL Comments: see functional navigator    Other Treatments:    See Function Navigator for Current Functional Status.   Therapy/Group: Individual Therapy  Humberto Sealsdwards, Allyah Heather J 09/07/2015, 9:55 AM

## 2015-09-07 NOTE — Progress Notes (Signed)
[place] PHYSICAL MEDICINE & REHABILITATION     PROGRESS NOTE  Subjective/Complaints:  Had some chest pain last nght. Feels better this morning. Able to sleep fairly well.   ROS:  +Nausea/vomitting, sleep disturbance, numbness hand/feet.  Denies SOB.  Objective: Vital Signs: Blood pressure 132/72, pulse 90, temperature 98.4 F (36.9 C), temperature source Oral, resp. rate 18, height 5\' 7"  (1.702 m), weight 97.7 kg (215 lb 6.4 oz), SpO2 96 %. No results found.  Recent Labs  09/05/15 0823  WBC 7.8  HGB 8.9*  HCT 29.2*  PLT 148*    Recent Labs  09/05/15 0823  NA 136  K 3.9  CL 104  GLUCOSE 141*  BUN 15  CREATININE 1.79*  CALCIUM 9.1   CBG (last 3)   Recent Labs  09/06/15 1628 09/06/15 2126 09/07/15 0635  GLUCAP 95 115* 89    Wt Readings from Last 3 Encounters:  09/06/15 97.7 kg (215 lb 6.4 oz)  09/03/15 101 kg (222 lb 10.6 oz)  12/14/12 90.7 kg (200 lb)    Physical Exam:  BP 132/72   Pulse 90   Temp 98.4 F (36.9 C) (Oral)   Resp 18   Ht 5\' 7"  (1.702 m)   Wt 97.7 kg (215 lb 6.4 oz)   LMP  (LMP Unknown)   SpO2 96%   BMI 33.74 kg/m  Constitutional: She appears well-developed and well-nourished. Obese  HENT: Normocephalic and atraumatic.  Eyes: Conjunctivae and EOM are normal.  Cardiovascular: Normal rate and regular rhythm.  No murmur heard. Respiratory: Effort normal and breath sounds normal.  GI: Soft. Bowel sounds are normal. She exhibits no distension. There is no tenderness.  Musculoskeletal: She exhibits edema. She exhibits mild tenderness to palpation of sternum and nearby chest wall R-BKA well healed. Neurological: She is alert and oriented.  Speech clear and follows commands without difficulty.   Sensory deficits LLE, right residual limb and bilateral hands.  Motor: B/l UE: 4/5 proximal to distal LLE: 4/5 hip flexion, knee extension, 4+/5 ankle dorsi/plantar flexion RLE: 4+/5 hip flexion, knee extension  Skin: Skin is warm and dry.  Numerous skin lesions, gluteal ulcer not examined today. Psychiatric: She has flat mood and affect. Her behavior is normal. Thought content normal.   Assessment/Plan: 1. Functional deficits secondary to debility which require 3+ hours per day of interdisciplinary therapy in a comprehensive inpatient rehab setting. Physiatrist is providing close team supervision and 24 hour management of active medical problems listed below. Physiatrist and rehab team continue to assess barriers to discharge/monitor patient progress toward functional and medical goals.  Function:  Bathing Bathing position   Position: Wheelchair/chair at sink  Bathing parts Body parts bathed by patient: Right arm, Left arm, Chest, Abdomen, Left upper leg, Right upper leg Body parts bathed by helper: Front perineal area, Buttocks, Left lower leg, Back  Bathing assist Assist Level: Touching or steadying assistance(Pt > 75%)      Upper Body Dressing/Undressing Upper body dressing   What is the patient wearing?: Pull over shirt/dress     Pull over shirt/dress - Perfomed by patient: Thread/unthread right sleeve, Thread/unthread left sleeve, Put head through opening, Pull shirt over trunk          Upper body assist Assist Level: Supervision or verbal cues   Set up : To obtain clothing/put away  Lower Body Dressing/Undressing Lower body dressing   What is the patient wearing?: Pants, Ted Hose, Non-skid slipper socks     Pants- Performed by patient:  Thread/unthread right pants leg Pants- Performed by helper: Thread/unthread left pants leg, Pull pants up/down   Non-skid slipper socks- Performed by helper: Don/doff right sock               TED Hose - Performed by helper: Don/doff right TED hose  Lower body assist Assist for lower body dressing: 2 Helpers      Financial traderToileting Toileting Toileting activity did not occur: No continent bowel/bladder event        Toileting assist     Transfers Chair/bed transfer    Chair/bed transfer method: Stand pivot Chair/bed transfer assist level: Maximal assist (Pt 25 - 49%/lift and lower) Chair/bed transfer assistive device: Armrests     Locomotion Ambulation Ambulation activity did not occur: Safety/medical concerns         Wheelchair   Type: Manual Max wheelchair distance: 15950ft  Assist Level: Supervision or verbal cues  Cognition Comprehension Comprehension assist level: Understands complex 90% of the time/cues 10% of the time  Expression Expression assist level: Expresses complex 90% of the time/cues < 10% of the time  Social Interaction Social Interaction assist level: Interacts appropriately 90% of the time - Needs monitoring or encouragement for participation or interaction.  Problem Solving Problem solving assist level: Solves complex 90% of the time/cues < 10% of the time  Memory Memory assist level: Recognizes or recalls 90% of the time/requires cueing < 10% of the time      Medical Problem List and Plan: 1. Deconditioning secondary to multiple medical issues including severe anemia, acute renal failure  continue CIR 2. DVT Prophylaxis/Anticoagulation: Mechanical: Sequential compression devices, below knee Bilateral lower extremities 3. Pain Management: Norco prn for chronic back pain  -advise heat/stretching for chest wall pain.  4. Mood: Team to provide ego support. LCSW to follow for evaluation and support.  5. Neuropsych: This patient iscapable of making decisions on herown behalf. 6. Skin/Wound Care: Aquacel Silver for MASD gluteal cleft.  7. Fluids/Electrolytes/Nutrition:   Scheduled compazine before meals for ongoing nausea, ?gastroparesis.   -add megace for appetite 400mg  daily 10 T2DM with neuropathy, nephropathy, vasculopathy and ?gastroparesis:   Monitor BS ac/hs.   SSI for elevated BS.  11. CKD: Avoid NSAIDs and other nephrotoxic drugs.   Hypocalcemia managed with tums. Monitor weights daily.   Will plan to d/c IVF  in near future 12. Bilateral hydronephrosis due to chronic urinary retention:   Will d/c foley in near future, currently good urine outpt   Voiding trial tomorrow?.  13. Anemia of chronic disease: On IV iron for supplement--received dose of Aransep on 8/29.   Hb up to 8.9 on 9/1 14. Thrombocytopenia: Monitor with serial checks  Plts up to 148 on 9/1 15. Constipation  Bowel reg increased on 9/1  -multiple bm's on 9/2  LOS (Days) 3 A FACE TO FACE EVALUATION WAS PERFORMED  [name] T 09/07/2015 11:15 AM

## 2015-09-07 NOTE — Progress Notes (Signed)
Physical Therapy Session Note  Patient Details  Name: [name] MRN: [id number] Date of Birth: 03/10/1965  Today's Date: 09/07/2015 PT Individual Time: 0807-0903 PT Individual Time Calculation (min): 56 min    Skilled Therapeutic Interventions/Progress Updates:    Pt received in bed & agreeable to PT, noting 5-6/10 back pain & RN made aware. Pt transferred supine>sitting EOB with supervision and use of bed rails. Pt transferred sit<>stand in [place] with supervision assist from elevated surface and tolerated standing ~3 minutes to allow therapist to change brief total assist. Pt completed multiple sit<>stand transfers with supervision in [place] lift and attempted to pull pants up over hips with close supervision/steady assist for standing balance and max assist for clothing management. Instructed pt on squat pivot transfer bed>w/c and required 2 trials as on first attempt pt transferred to standing and began to let go of w/c armrest. Instructed pt in squat position and pivoting buttocks over from bed>w/c and pt completed transfer with min assist, max multimodal cuing, and bed rails/armrests. Pt propelled w/c x 50 ft + 50 ft + 100 ft with BUE & supervision with therapist managing IV pole; task focused on cardiovascular endurance training & BUE strengthening. At end of session pt left sitting in w/c in room with all needs within reach & set up with breakfast tray.  Educated pt on need to attempt tasks before stating she needs help; encouraged pt to work towards independence with tasks.  Therapy Documentation Precautions:  Precautions Precautions: Fall Precaution Comments: R BKA 2013 Restrictions Weight Bearing Restrictions: No   See Function Navigator for Current Functional Status.   Therapy/Group: Individual Therapy  Sandi MariscalVictoria M Gal Smolinski 09/07/2015, 12:18 PM

## 2015-09-08 ENCOUNTER — Inpatient Hospital Stay (HOSPITAL_COMMUNITY): Payer: Medicaid Other | Admitting: Occupational Therapy

## 2015-09-08 ENCOUNTER — Inpatient Hospital Stay (HOSPITAL_COMMUNITY): Payer: Medicaid Other | Admitting: Physical Therapy

## 2015-09-08 DIAGNOSIS — I10 Essential (primary) hypertension: Secondary | ICD-10-CM

## 2015-09-08 DIAGNOSIS — L739 Follicular disorder, unspecified: Secondary | ICD-10-CM

## 2015-09-08 LAB — URINE MICROSCOPIC-ADD ON

## 2015-09-08 LAB — CBC
HCT: 30 % — ABNORMAL LOW (ref 36.0–46.0)
Hemoglobin: 9.2 g/dL — ABNORMAL LOW (ref 12.0–15.0)
MCH: 27.3 pg (ref 26.0–34.0)
MCHC: 30.7 g/dL (ref 30.0–36.0)
MCV: 89 fL (ref 78.0–100.0)
Platelets: 125 10*3/uL — ABNORMAL LOW (ref 150–400)
RBC: 3.37 MIL/uL — ABNORMAL LOW (ref 3.87–5.11)
RDW: 16 % — ABNORMAL HIGH (ref 11.5–15.5)
WBC: 7.9 10*3/uL (ref 4.0–10.5)

## 2015-09-08 LAB — RENAL FUNCTION PANEL
Albumin: 3.3 g/dL — ABNORMAL LOW (ref 3.5–5.0)
Anion gap: 7 (ref 5–15)
BUN: 11 mg/dL (ref 6–20)
CO2: 24 mmol/L (ref 22–32)
Calcium: 8.2 mg/dL — ABNORMAL LOW (ref 8.9–10.3)
Chloride: 101 mmol/L (ref 101–111)
Creatinine, Ser: 1.64 mg/dL — ABNORMAL HIGH (ref 0.44–1.00)
GFR calc Af Amer: 42 mL/min — ABNORMAL LOW (ref >60)
GFR calc non Af Amer: 36 mL/min — ABNORMAL LOW (ref >60)
Glucose, Bld: 129 mg/dL — ABNORMAL HIGH (ref 65–99)
Phosphorus: 3.1 mg/dL (ref 2.5–4.6)
Potassium: 3.8 mmol/L (ref 3.5–5.1)
Sodium: 132 mmol/L — ABNORMAL LOW (ref 135–145)

## 2015-09-08 LAB — URINALYSIS, ROUTINE W REFLEX MICROSCOPIC
Bilirubin Urine: NEGATIVE
Glucose, UA: NEGATIVE mg/dL
Ketones, ur: NEGATIVE mg/dL
Nitrite: NEGATIVE
Protein, ur: 30 mg/dL — AB
Specific Gravity, Urine: 1.006 (ref 1.005–1.030)
pH: 6.5 (ref 5.0–8.0)

## 2015-09-08 LAB — GLUCOSE, CAPILLARY
Glucose-Capillary: 99 mg/dL (ref 65–99)
Glucose-Capillary: 140 mg/dL — ABNORMAL HIGH (ref 65–99)
Glucose-Capillary: 122 mg/dL — ABNORMAL HIGH (ref 65–99)
Glucose-Capillary: 158 mg/dL — ABNORMAL HIGH (ref 65–99)

## 2015-09-08 MED ORDER — SODIUM CHLORIDE 0.9 % IV SOLN
125.0000 mg | INTRAVENOUS | Status: AC
  Administered 2015-09-08 – 2015-09-14 (×4): 125 mg via INTRAVENOUS
  Filled 2015-09-08 (×7): qty 10

## 2015-09-08 MED ORDER — LIDOCAINE HCL 2 % EX GEL: CUTANEOUS | Status: DC | PRN

## 2015-09-08 MED ORDER — CEPHALEXIN 250 MG PO CAPS
250.0000 mg | ORAL_CAPSULE | Freq: Four times a day (QID) | ORAL | Status: DC
  Administered 2015-09-08 – 2015-09-12 (×18): 250 mg via ORAL
  Filled 2015-09-08 (×19): qty 1

## 2015-09-08 MED ORDER — CARVEDILOL 25 MG PO TABS
25.0000 mg | ORAL_TABLET | Freq: Two times a day (BID) | ORAL | Status: DC
  Administered 2015-09-08 (×2): 25 mg via ORAL
  Filled 2015-09-08 (×3): qty 1

## 2015-09-08 NOTE — Progress Notes (Signed)
Occupational Therapy Session Note  Patient Details  Name: [name] MRN: [id number] Date of Birth: September 08, 1965  Today's Date: 09/08/2015   Session 1 OT Individual Time: 9355-2174 OT Individual Time Calculation (min): 58 min   Session 2 OT Individual Time: 7159-5396 OT Individual Time Calculation (min): 59 min   Short Term Goals: Week 1:  OT Short Term Goal 1 (Week 1): Pt will transfer to toilet using LRD with min A OT Short Term Goal 2 (Week 1): Pt will complete LB dressing with Min A OT Short Term Goal 3 (Week 1): Pt will complete bathing with Min A OT Short Term Goal 4 (Week 1): Pt will maintain standing balance for 3 minutes with Min A and RW while reaching within base of support.  Skilled Therapeutic Interventions/Progress Updates:    Session 1 Pt seen for skilled OT treatment focused on bathing/dressing/self-care training, sit<>stands, and standing balance. Pt completed sponge bath at the sink with overall Min A w/ set-up and Min A to stand w/ Stedy. Pt maintained standing balance utilizing B UEs with min gaurd while OT assisted with washing buttocks. Pt able to thread B LE s into pant legs,stand w/ stedy and Min A, then could remove unilateral UE from stedy to assist with pulling up pants, but could not maintain balance long enough to complete task fully, requiring overall Min A for LB dressing. Pt left seated in recliner at end of session with needs met and RN present.   Session 2  Pt seen for second OT session with focus on standing endurance, sit<>stands, dynamic standing balance, fine-motor coordination, and general hand strengthening. Pt with difficulty propelling w/c 2/2 poor grip strength and sensation in B hands. OT added thera-band to w/c to improve grasp w/ w/c propulsion. Pt  completed modified stand-pivot transfers with overall  Min A. Sit<>stands w/  RW and Min A. Pt able to maintain dynamic standing balance with overall min guard A while reaching outside base of  support. Pt with blurry vision and difficulty reading playing cards. Will further assess vision next session. Pt then worked on Nutritional therapist, object manipulation, and translation using peg board. Pt able to grasp 3 pegs at a time and utilize thumb, pointer, and long finger, to translate object from palm to pincer grasp with increased time and effort. Pt then performed stand-pivot transfer back to w/c using RW and Min A. Pt reported udden onset of neck and shoulder pain, so OT utilized stretching and massage techniques to decrease muscle pain. Pt reported improvement of status and was left seated in w/c at end of session with needs met.   Therapy Documentation  Precautions:  Precautions Precautions: Fall Precaution Comments: R BKA 2013 Restrictions Weight Bearing Restrictions: No  Pain: Pain Assessment Pain Assessment: 0-10 Pain Score: 4  Pain Type: Chronic pain Pain Location: Neck Pain Orientation: Mid Pain Descriptors / Indicators: Aching Pain Frequency: Intermittent Pain Onset: Gradual Pain Intervention(s): Repositioned;Massage ADL: ADL ADL Comments: see functional navigator  See Function Navigator for Current Functional Status.   Therapy/Group: Individual Therapy  Valma Cava 09/08/2015, 4:19 PM

## 2015-09-08 NOTE — Progress Notes (Signed)
S:She feels much more energetic compared to the past few days, active with PT O:BP 127/63   Pulse 84   Temp 98.6 F (37 C) (Oral)   Resp 18   Ht _0  (1.702 m)   Wt 199 lb 14.4 oz (90.7 kg)   LMP  (LMP Unknown)   SpO2 100%   BMI 31.31 kg/m   Intake/Output Summary (Last 24 hours) at 09/08/15 1205 Last data filed at 09/08/15 1043  Gross per 24 hour  Intake              620 ml  Output             2950 ml  Net            -2330 ml   Intake/Output: I/O last 3 completed shifts: In: 2330 [P.O.:720; I.V.:500] Out: 5150 [Urine:5150]  Intake/Output this shift:  Total I/O In: -  Out: 300 [Urine:300] Weight change:  Gen: Sitting in wheelchair, comfortable CVS: RRR, no murmur Resp: CTAB Ext:R BKA present, numerous dark lesions across all limbs Skin: dark papule under left eye, extensor surfaces of limbs, with some excoriations   Recent Labs Lab 09/02/15 0427 09/03/15 0358 09/04/15 0338 09/05/15 0823  NA 140 143 138 136  K 4.6 4.6 4.8 3.9  CL 108 110 106 104  CO2 _1 GLUCOSE 99 93 157* 141*  BUN 30* 25* 22* 15  CREATININE 2.35* 2.24* 2.05* 1.79*  ALBUMIN 2.4* 2.5* 2.8* 3.0*  CALCIUM 7.9* 8.5* 8.8* 9.1  PHOS 3.6 3.5 3.4  --   AST  --   --   --  21  ALT  --   --   --  17   Liver Function Tests:  Recent Labs Lab 09/03/15 0358 09/04/15 0338 09/05/15 0823  AST  --   --  21  ALT  --   --  17  ALKPHOS  --   --  76  BILITOT  --   --  0.4  PROT  --   --  7.3  ALBUMIN 2.5* 2.8* 3.0*   CBC:  Recent Labs Lab 09/02/15 0427 09/04/15 0336 09/05/15 0823  WBC 6.6 7.3 7.8  NEUTROABS 4.7  --  6.4  HGB 7.1* 7.9* 8.9*  HCT 23.1* 25.8* 29.2*  MCV 89.9 89.0 89.0  PLT 91* 102* 148*    CBG:  Recent Labs Lab 09/07/15 1138 09/07/15 1643 09/07/15 2045 09/08/15 0614 09/08/15 1140  GLUCAP 114* 91 115* 99 140*    Studies/Results: No results found. . calcium carbonate  2 tablet Oral TID  . carvedilol  25 mg Oral BID WC  . cephALEXin  250 mg Oral Q6H  .  ferric gluconate (FERRLECIT/NULECIT) IV  125 mg Intravenous QODAY  . insulin aspart  0-20 Units Subcutaneous TID WC  . insulin aspart  0-5 Units Subcutaneous QHS  . prochlorperazine  5 mg Oral TID AC  . senna-docusate  2 tablet Oral BID    BMET    Component Value Date/Time   NA 136 09/05/2015 0823   K 3.9 09/05/2015 0823   CL 104 09/05/2015 0823   CO2 25 09/05/2015 0823   GLUCOSE 141 (H) 09/05/2015 0823   BUN 15 09/05/2015 0823   CREATININE 1.79 (H) 09/05/2015 0823   CALCIUM 9.1 09/05/2015 0823   GFRNONAA 32 (L) 09/05/2015 0823   GFRAA 38 (L) 09/05/2015 0823   CBC    Component Value Date/Time   WBC 7.8 09/05/2015 0823  RBC 3.28 (L) 09/05/2015 0823   HGB 8.9 (L) 09/05/2015 0823   HCT 29.2 (L) 09/05/2015 0823   HCT 20.8 (L) 08/26/2015 1436   PLT 148 (L) 09/05/2015 0823   MCV 89.0 09/05/2015 0823   MCH 27.1 09/05/2015 0823   MCHC 30.5 09/05/2015 0823   RDW 15.8 (H) 09/05/2015 0823   LYMPHSABS 1.0 09/05/2015 0823   MONOABS 0.3 09/05/2015 0823   EOSABS 0.0 09/05/2015 0823   BASOSABS 0.0 09/05/2015 0823     Assessment/Plan:  1. Acute renal failure: She continues to pass good urine volumes with postobstructive diuresis. Last chemistry checked several days ago which needs repeating. It is not clear what residual deficit she will have as a baseline, although normal kidney size suggests more of this is acute.  2. Bilateral hydronephrosis: Likely neurogenic bladder from her DM as she also has peripheral neuropathy. Agree with planned voiding trial and consider urological evaluation if she cannot void spontaneously.  3. Anemia/thrombocytopenia: Severe anemia and thrombocytopenia improving after transfusion now with feraheme and aranesp supplementation. She had no history of recent melena or GI symptoms and ferritin was WNL on admission. Bone marrow biopsy and peripheral blood smears unremarkable. Acute renal failure is unlikely to cause such severe hematological effects. 1. She  admits to having some hematechezia and her anemia is out of proportion to her CKD.  Recommend GI Evaluation for EGD and colonoscopy to further evaluate her profound anemia.   2. tsat was low, however she received blood transfusion and started on aranesp.   3. Unfortunately she has not had labs in 3 days so will recheck today to re-evaluate her H/H as well as her renal function  4. Hypocalcemia: is now corrected with supplementation  5. Metabolic acidosis: resolved  Collier Salina, MD PGY-II Internal Medicine Resident Pager# [phone] 09/08/2015, 12:05 PM  I have seen and examined this patient and agree with plan and assessment in the above note with renal recommendations/intervention highlighted.  Broadus John A Ryah Cribb,MD 09/08/2015 12:17 PM

## 2015-09-08 NOTE — Progress Notes (Signed)
Notified Dr. Allena KatzPatel of lesion on R. Upper thigh and of patient's request for lubricating tears for dry eyes.

## 2015-09-08 NOTE — Progress Notes (Signed)
[place] [facility]IC[facility] & RE[facility]ILITATION     PROGRESS NOTE  Subjective/Complaints:  Pt laying in bed this AM.  She complains of dry eyes and "bump in her groin".  She states this is reoccurring, resolving spontaneously after some time.    ROS:  +Dry eyes. Denies CP, SOB, N/V/D.  Objective: Vital Signs: Blood pressure (!) 174/69, pulse 91, temperature 98.6 F (37 C), temperature source Oral, resp. rate 18, height 5\' 7"  (1.702 m), weight 90.7 kg (199 lb 14.4 oz), SpO2 100 %. No results found. No results for input(s): WBC, HGB, HCT, PLT in the last 72 hours. No results for input(s): NA, K, CL, GLUCOSE, BUN, CREATININE, CALCIUM in the last 72 hours.  Invalid input(s): CO CBG (last 3)   Recent Labs  09/07/15 1643 09/07/15 2045 09/08/15 0614  GLUCAP 91 115* 99    Wt Readings from Last 3 Encounters:  09/08/15 90.7 kg (199 lb 14.4 oz)  09/03/15 101 kg (222 lb 10.6 oz)  12/14/12 90.7 kg (200 lb)    Physical Exam:  BP (!) 174/69 (BP Location: Right Arm)   Pulse 91   Temp 98.6 F (37 C) (Oral)   Resp 18   Ht 5\' 7"  (1.702 m)   Wt 90.7 kg (199 lb 14.4 oz)   LMP  (LMP Unknown)   SpO2 100%   BMI 31.31 kg/m  Constitutional: She appears well-developed and well-nourished. Obese  HENT: Normocephalic and atraumatic.  Eyes: Conjunctivae and EOM are normal.  Cardiovascular: Normal rate and regular rhythm.  No murmur heard. Respiratory: Effort normal and breath sounds normal.  GI: Soft. Bowel sounds are normal. She exhibits no distension. There is no tenderness.  Musculoskeletal: She exhibits edema. No tenderness. R-BKA well healed. Neurological: She is alert and oriented.  Speech clear and follows commands without difficulty.   Sensory deficits LLE, right residual limb and bilateral hands.  Motor: B/l UE: 4/5 proximal to distal LLE: 4/5 hip flexion, knee extension, 4+/5 ankle dorsi/plantar flexion RLE: 4+/5 hip flexion, knee extension  Skin: Skin is warm and dry. Numerous  skin lesions. Right inguinal groin area with small amount of drainage and induration, foul odor Psychiatric: She has flat mood and affect. Her behavior is normal. Thought content normal.   Assessment/Plan: 1. Functional deficits secondary to debility which require 3+ hours per day of interdisciplinary therapy in a comprehensive inpatient rehab setting. Physiatrist is providing close team supervision and 24 hour management of active medical problems listed below. Physiatrist and rehab team continue to assess barriers to discharge/monitor patient progress toward functional and medical goals.  Function:  Bathing Bathing position   Position: Wheelchair/chair at sink  Bathing parts Body parts bathed by patient: Right arm, Left arm, Chest, Abdomen, Left upper leg, Right upper leg Body parts bathed by helper: Front perineal area, Buttocks, Left lower leg, Back  Bathing assist Assist Level: Touching or steadying assistance(Pt > 75%)      Upper Body Dressing/Undressing Upper body dressing   What is the patient wearing?: Hospital gown     Pull over shirt/dress - Perfomed by patient: Thread/unthread right sleeve, Thread/unthread left sleeve, Put head through opening, Pull shirt over trunk          Upper body assist Assist Level: Supervision or verbal cues   Set up : To obtain clothing/put away  Lower Body Dressing/Undressing Lower body dressing   What is the patient wearing?: Non-skid slipper socks, Hospital Gown     Pants- Performed by patient: Thread/unthread right  pants leg Pants- Performed by helper: Thread/unthread left pants leg, Pull pants up/down   Non-skid slipper socks- Performed by helper: Don/doff right sock               TED Hose - Performed by helper: Don/doff right TED hose  Lower body assist Assist for lower body dressing: Touching or steadying assistance (Pt > 75%)      Toileting Toileting Toileting activity did not occur: No continent bowel/bladder event         Toileting assist     Transfers Chair/bed transfer   Chair/bed transfer method: Squat pivot Chair/bed transfer assist level: Touching or steadying assistance (Pt > 75%) Chair/bed transfer assistive device: Bedrails, Armrests     Locomotion Ambulation Ambulation activity did not occur: Safety/medical concerns         Wheelchair   Type: Manual Max wheelchair distance: 100 ft Assist Level: Supervision or verbal cues  Cognition Comprehension Comprehension assist level: Understands basic 90% of the time/cues < 10% of the time  Expression Expression assist level: Expresses basic 90% of the time/requires cueing < 10% of the time.  Social Interaction Social Interaction assist level: Interacts appropriately 90% of the time - Needs monitoring or encouragement for participation or interaction.  Problem Solving Problem solving assist level: Solves complex 90% of the time/cues < 10% of the time  Memory Memory assist level: Recognizes or recalls 90% of the time/requires cueing < 10% of the time      Medical Problem List and Plan: 1. Deconditioning secondary to multiple medical issues including severe anemia, acute renal failure  continue CIR 2. DVT Prophylaxis/Anticoagulation: Mechanical: Sequential compression devices, below knee Bilateral lower extremities 3. Pain Management: Norco prn for chronic back pain  -advise heat/stretching for chest wall pain.  4. Mood: Team to provide ego support. LCSW to follow for evaluation and support.  5. Neuropsych: This patient iscapable of making decisions on herown behalf. 6. Skin/Wound Care: Aquacel Silver for MASD gluteal cleft.  7. Fluids/Electrolytes/Nutrition:   Scheduled compazine before meals for ongoing nausea, ?gastroparesis.   -added megace for appetite 400mg  daily 10 T2DM with neuropathy, nephropathy, vasculopathy and ?gastroparesis:   Monitor BS ac/hs.   SSI for elevated BS.   Relatively stable 11. CKD: Avoid NSAIDs and  other nephrotoxic drugs.   Hypocalcemia managed with tums. Monitor weights daily.   D/ced IVF 9/4 12. Bilateral hydronephrosis due to chronic urinary retention:   D/ced foley 9/4  Voiding trial 13. Anemia of chronic disease: On IV iron for supplement--received dose of Aransep on 8/29.   Hb up to 8.9 on 9/1  Labs ordered for tomorrow 14. Thrombocytopenia: Monitor with serial checks  Plts up to 148 on 9/1  Labs ordered for tomorrow 15. Constipation  Bowel reg increased on 9/1 16. HTN  Coreg 6.25, increased to 25 on 6/4  Cont to monitor 17. Folliculitis  Right inguinal area  Will start empiric Keflex 9/4 x7 days.  LOS (Days) 4 A FACE TO FACE EVALUATION WAS PERFORMED  [name] Karis Juba 09/08/2015 8:27 AM

## 2015-09-08 NOTE — Progress Notes (Signed)
Physical Therapy Session Note  Patient Details  Name: Earvin HansenMichelle L Strothers MRN: [id number] Date of Birth: 10/15/1965  Today's Date: 09/08/2015 PT Individual Time: [id number] and [id number] PT Individual Time Calculation (min): 56 min and 29 min   Short Term Goals: Week 1:  PT Short Term Goal 1 (Week 1): Pt will ambulate 10 ft with RW & mod A. PT Short Term Goal 2 (Week 1): Pt will initiate stair training. PT Short Term Goal 3 (Week 1): Pt will propel w/c 150 ft with supervision.   Skilled Therapeutic Interventions/Progress Updates:    Treatment 1: Pt received in bed & agreeable to PT, noting 7/10 pain in back & RN made aware. Pt transferred to edge of bed with HOB elevated, bed rails & supervision. Therapist provided total assist for w/c set up and instructed pt in squat pivot transfer; pt completed transfer bed>w/c with min assist overall and maximum multimodal cuing for hand placement and technique. Pt propelled w/c x 100 ft + 30 ft with BUE & supervision for BUE strengthening and cardiovascular endurance training; pt required rest breaks 2/2 UE fatigue. In gym pt completed squat pivot w/c<>mat table with min assist, maximum multimodal cuing & demonstration for task; pt noted fear with task. Pt reported she completed stand pivot transfers at home & demonstrated this, w/c<>mat table with min assist. Pt appears to have increased comfort with performing task with method she used prior to admission. Pt propelled w/c 25 ft back towards room with supervision; at end of session pt left in w/c in room with all needs within reach.   Updated safety plan to reflect pt can perform squat pivot transfers with min assist & RN made aware.   Treatment 2: Pt received in w/c & agreeable to PT, noting 4-5/10 pain in neck & RN made aware. Pt transported down to gym via w/c total assist for time management. Pt transferred sit<>stand with maximum cuing for hand placement & technique & min assist in parallel bars. Gait  training x 3 ft forwards + 3 ft backwards in parallel bars with min assist and cuing to lift up with BUE to advance LLE instead of hopping on LLE. Transitioned to gait training with RW & min assist with pt ambulating 8 ft with minimal foot clearance LLE 2/2 fatigue & self report fear of falling. Pt with difficulty performing turns and instead pivots on LLE. Pt returned to w/c & transported back to room via w/c total assist. Pt left in w/c in room with all needs within reach.   Therapy Documentation Precautions:  Precautions Precautions: Fall Precaution Comments: R BKA 2013 Restrictions Weight Bearing Restrictions: No   See Function Navigator for Current Functional Status.   Therapy/Group: Individual Therapy  Sandi MariscalVictoria M Miller 09/08/2015, 12:21 PM

## 2015-09-08 NOTE — Plan of Care (Signed)
Problem: RH BLADDER ELIMINATION Goal: RH STG MANAGE BLADDER WITH ASSISTANCE STG Manage Bladder With max Assistance   Outcome: Not Progressing Pt. Having foley

## 2015-09-08 NOTE — Progress Notes (Signed)
Foley catheter removed as per order.  Urine specimen collected and patient educated re:  Post-void residual checks and potential I&O catheterizing.

## 2015-09-08 NOTE — Progress Notes (Signed)
Notified Pam Love, PA of status of pressure ulcer L. Buttocks and clarified IV iron order

## 2015-09-09 ENCOUNTER — Inpatient Hospital Stay (HOSPITAL_COMMUNITY): Payer: Medicaid Other | Admitting: Occupational Therapy

## 2015-09-09 ENCOUNTER — Inpatient Hospital Stay (HOSPITAL_COMMUNITY): Payer: Medicaid Other | Admitting: Physical Therapy

## 2015-09-09 DIAGNOSIS — D62 Acute posthemorrhagic anemia: Secondary | ICD-10-CM

## 2015-09-09 DIAGNOSIS — N319 Neuromuscular dysfunction of bladder, unspecified: Secondary | ICD-10-CM

## 2015-09-09 LAB — URINE CULTURE: Culture: <10000 — AB

## 2015-09-09 LAB — GLUCOSE, CAPILLARY
Glucose-Capillary: 110 mg/dL — ABNORMAL HIGH (ref 65–99)
Glucose-Capillary: 108 mg/dL — ABNORMAL HIGH (ref 65–99)
Glucose-Capillary: 108 mg/dL — ABNORMAL HIGH (ref 65–99)
Glucose-Capillary: 114 mg/dL — ABNORMAL HIGH (ref 65–99)

## 2015-09-09 LAB — OCCULT BLOOD X 1 CARD TO LAB, STOOL: Fecal Occult Bld: POSITIVE — AB

## 2015-09-09 MED ORDER — BETHANECHOL CHLORIDE 10 MG PO TABS
10.0000 mg | ORAL_TABLET | Freq: Three times a day (TID) | ORAL | Status: DC
  Administered 2015-09-09 – 2015-09-10 (×3): 10 mg via ORAL
  Filled 2015-09-09 (×3): qty 1

## 2015-09-09 MED ORDER — CARVEDILOL 12.5 MG PO TABS
12.5000 mg | ORAL_TABLET | Freq: Two times a day (BID) | ORAL | Status: DC
  Administered 2015-09-09 – 2015-09-10 (×2): 12.5 mg via ORAL
  Filled 2015-09-09 (×2): qty 1

## 2015-09-09 NOTE — Progress Notes (Signed)
Patient noted with low b/p. Rechecked for 75/40 B/P 82 HR 100% RA in right arm . B/P 82/50 HR 83 . Patient reported she was dizzy. Marissa NestlePam Love, PA notified. Patient reports feeling better this pm. Abdominal binder ordered  Continue with plan of care.  Cleotilde NeerJoyce, Jayma Volpi S

## 2015-09-09 NOTE — Progress Notes (Signed)
Physical Therapy Session Note  Patient Details  Name: [name] MRN: [id number] Date of Birth: 12/04/1965  Today's Date: 09/09/2015 PT Individual Time: 0815-0900 and 1335-1420 PT Individual Time Calculation (min): 45 min and 45 min Pt missed 15 minutes of scheduled PT time due to: nursing care   Short Term Goals: Week 1:  PT Short Term Goal 1 (Week 1): Pt will ambulate 10 ft with RW & mod A. PT Short Term Goal 2 (Week 1): Pt will initiate stair training. PT Short Term Goal 3 (Week 1): Pt will propel w/c 150 ft with supervision.   Skilled Therapeutic Interventions/Progress Updates:    Treatment 1: Pt received in bed & agreeable to treatment. Pt noted 2/10 back pain at rest but noted 7/10 neck pain after transferring to w/c. Pt transferred to sitting EOB with use of hospital bed features & supervision assist. Therapist provided total assist for w/c set up by bed and pt completed stand pivot transfer (without RW) bed>w/c with supervision assist. Pt able to manage L w/c leg rest with cuing from therapist and total assist for R amputee support pad. Pt propelled w/c room>rehab apartment ~200 ft total with >3 rest breaks 2/2 BUE fatigue. Discussed set up at home & pt reports she has a traditional bed on the 2nd level of her home but has not been up there since 2013. With further discussion pt reports she has a hospital bed on the first level of her home that she uses. RN & MD arrived to administer pain medication & converse with pt; pt missed 15 minutes of scheduled PT time 2/2 RN/MD care. Pt propelled w/c ~200 ft back to room with only 1 rest break & supervision overall. At end of session pt left in w/c in room with all needs within reach.   Treatment 2: Pt received in w/c & agreeable to PT, denying c/o pain at rest. Therapist observed pt to have blood on RUE & pt reported the tape to her IV had come loose. Notified RN who inspected pt's IV site. Pt reports she has her prosthetic leg here & wishes  to use it, even though she has not used it since she received it in 2013. Attempted to don pt's prosthetic leg but unable to lock into place as prosthetic appears to be too small now. Educated pt on need for prosthetic consult to ensure proper fit, need for wearing schedule, and continued therapy in outpatient setting for education/gait training with prosthetic limb; pt voiced understanding. Initiated gait training with RW & min assist with pt unable to generate enough strength in BUE & LLE to clear LLE from floor during each step. Provided maximum multimodal cuing during task & demonstration regarding proper gait pattern. At end of session pt returned to room via w/c total assist for time management. Pt left in w/c with all needs within reach.    Therapy Documentation Precautions:  Precautions Precautions: Fall Precaution Comments: R BKA 2013 Restrictions Weight Bearing Restrictions: No  See Function Navigator for Current Functional Status.   Therapy/Group: Individual Therapy  [name] 09/09/2015, 12:28 PM

## 2015-09-09 NOTE — Progress Notes (Signed)
[place] PHYSICAL MEDICINE & REHABILITATION     PROGRESS NOTE  Subjective/Complaints:  Pt sitting up in bed this AM.  She has questions about workup for anemia.  She still ahs urinary retention.  Her groin feels a little better.   ROS:  +Urinary retention. Denies CP, SOB, N/V/D.  Objective: Vital Signs: Blood pressure (!) 118/48, pulse 86, temperature 99.2 F (37.3 C), temperature source Oral, resp. rate 17, height 5\' 7"  (1.702 m), weight 91.4 kg (201 lb 9.6 oz), SpO2 94 %. No results found.  Recent Labs  09/08/15 1506  WBC 7.9  HGB 9.2*  HCT 30.0*  PLT 125*    Recent Labs  09/08/15 1506  NA 132*  K 3.8  CL 101  GLUCOSE 129*  BUN 11  CREATININE 1.64*  CALCIUM 8.2*   CBG (last 3)   Recent Labs  09/08/15 1630 09/08/15 2049 09/09/15 0644  GLUCAP 122* 158* 110*    Wt Readings from Last 3 Encounters:  09/09/15 91.4 kg (201 lb 9.6 oz)  09/03/15 101 kg (222 lb 10.6 oz)  12/14/12 90.7 kg (200 lb)    Physical Exam:  BP (!) 118/48 (BP Location: Left Arm)   Pulse 86   Temp 99.2 F (37.3 C) (Oral)   Resp 17   Ht 5\' 7"  (1.702 m)   Wt 91.4 kg (201 lb 9.6 oz)   LMP  (LMP Unknown)   SpO2 94%   BMI 31.58 kg/m  Constitutional: She appears well-developed and well-nourished. Obese  HENT: Normocephalic and atraumatic.  Eyes: Conjunctivae and EOM are normal.  Cardiovascular: Normal rate and regular rhythm.  No murmur heard. Respiratory: Effort normal and breath sounds normal.  GI: Soft. Bowel sounds are normal. She exhibits no distension. There is no tenderness.  Musculoskeletal: She exhibits edema. No tenderness. R-BKA well healed. Neurological: She is alert and oriented.  Speech clear and follows commands without difficulty.   Sensory deficits LLE, right residual limb and bilateral hands.  Motor: B/l UE: 4/5 proximal to distal LLE: 4/5 hip flexion, knee extension, 4+/5 ankle dorsi/plantar flexion RLE: 4+/5 hip flexion, knee extension  Skin: Skin is warm and  dry. Numerous skin lesions. Right inguinal groin area with small amount of drainage and induration, foul odor Psychiatric: She has flat mood and affect. Her behavior is normal. Thought content normal.   Assessment/Plan: 1. Functional deficits secondary to debility which require 3+ hours per day of interdisciplinary therapy in a comprehensive inpatient rehab setting. Physiatrist is providing close team supervision and 24 hour management of active medical problems listed below. Physiatrist and rehab team continue to assess barriers to discharge/monitor patient progress toward functional and medical goals.  Function:  Bathing Bathing position   Position: Wheelchair/chair at sink  Bathing parts Body parts bathed by patient: Right arm, Left arm, Chest, Abdomen, Right lower leg, Left lower leg Body parts bathed by helper: Front perineal area, Buttocks, Left lower leg  Bathing assist Assist Level: Touching or steadying assistance(Pt > 75%)      Upper Body Dressing/Undressing Upper body dressing   What is the patient wearing?: Pull over shirt/dress     Pull over shirt/dress - Perfomed by patient: Thread/unthread right sleeve, Thread/unthread left sleeve, Put head through opening, Pull shirt over trunk          Upper body assist Assist Level: Supervision or verbal cues, Set up   Set up : To obtain clothing/put away  Lower Body Dressing/Undressing Lower body dressing   What is the  patient wearing?: Non-skid slipper socks, Pants     Pants- Performed by patient: Thread/unthread right pants leg Pants- Performed by helper: Thread/unthread left pants leg, Pull pants up/down   Non-skid slipper socks- Performed by helper: Don/doff right sock               TED Hose - Performed by helper: Don/doff right TED hose  Lower body assist Assist for lower body dressing: Touching or steadying assistance (Pt > 75%)      Toileting Toileting Toileting activity did not occur: No continent  bowel/bladder event        Toileting assist     Transfers Chair/bed transfer   Chair/bed transfer method: Squat pivot Chair/bed transfer assist level: Touching or steadying assistance (Pt > 75%) Chair/bed transfer assistive device: Armrests, Bedrails     Locomotion Ambulation Ambulation activity did not occur: Safety/medical concerns   Max distance: 8 ft Assist level: Touching or steadying assistance (Pt > 75%)   Wheelchair   Type: Manual Max wheelchair distance: 100 ft Assist Level: Supervision or verbal cues  Cognition Comprehension Comprehension assist level: Understands basic 90% of the time/cues < 10% of the time  Expression Expression assist level: Expresses basic 90% of the time/requires cueing < 10% of the time.  Social Interaction Social Interaction assist level: Interacts appropriately 90% of the time - Needs monitoring or encouragement for participation or interaction.  Problem Solving Problem solving assist level: Solves complex 90% of the time/cues < 10% of the time  Memory Memory assist level: Recognizes or recalls 90% of the time/requires cueing < 10% of the time      Medical Problem List and Plan: 1. Deconditioning secondary to multiple medical issues including severe anemia, acute renal failure  continue CIR 2. DVT Prophylaxis/Anticoagulation: Mechanical: Sequential compression devices, below knee Bilateral lower extremities 3. Pain Management: Norco prn for chronic back pain 4. Mood: Team to provide ego support. LCSW to follow for evaluation and support.  5. Neuropsych: This patient iscapable of making decisions on herown behalf. 6. Skin/Wound Care: Aquacel Silver for MASD gluteal cleft.  7. Fluids/Electrolytes/Nutrition:   Scheduled compazine before meals for ongoing nausea, ?gastroparesis.   -added megace for appetite 400mg  daily 10 T2DM with neuropathy, nephropathy, vasculopathy and ?gastroparesis:   Monitor BS ac/hs.   SSI for elevated BS.    Relatively stable 11. CKD: Avoid NSAIDs and other nephrotoxic drugs.   Hypocalcemia managed with tums. Monitor weights daily.   D/ced IVF 9/4 12. Bilateral hydronephrosis due to chronic urinary retention:   D/ced foley 9/4  Voiding trial  Urocholine 10mg  TID started on 9/5  Pt will likely need Urology follow up as outpt 13. ABLA on Anemia of chronic disease: On IV iron for supplement--received dose of Aransep on 8/29.  Hb up to 9.2 on 9/4 (slowly improving)  Will request GI consult 14. Thrombocytopenia: Monitor with serial checks  Plts up to 125 on 9/4 15. Constipation  Bowel reg increased on 9/1 16. HTN: Improving  Coreg 6.25, increased to 25 on 6/4  Cont to monitor 17. Folliculitis  Right inguinal area  Cont Keflex 9/4 x7 days.  LOS (Days) 5 A FACE TO FACE EVALUATION WAS PERFORMED  Damari Suastegui Karis Jubanil Hernando Reali 09/09/2015 8:48 AM

## 2015-09-09 NOTE — Progress Notes (Signed)
S: She was unable to void after foley removed yesterday requiring I/O catheterization and does not have any sensation of urinary urge. She had some lightheadedness with sitting and standing for therapy this morning O:BP (!) 118/48 (BP Location: Left Arm)   Pulse 86   Temp 99.2 F (37.3 C) (Oral)   Resp 17   Ht '5\' 7"'  (1.702 m)   Wt 201 lb 9.6 oz (91.4 kg)   LMP  (LMP Unknown)   SpO2 94%   BMI 31.58 kg/m   Intake/Output Summary (Last 24 hours) at 09/09/15 1137 Last data filed at 09/09/15 0700  Gross per 24 hour  Intake              940 ml  Output             1300 ml  Net             -360 ml   Intake/Output: I/O last 3 completed shifts: In: 1180 [P.O.:1180] Out: 3675 [Urine:3675]  Intake/Output this shift:  No intake/output data recorded. Weight change: 1 lb 11.2 oz (0.771 kg) Gen: Sitting in wheelchair, in no distress CVS: RRR, no murmur Resp: CTAB Ext: R BKA present, no left leg pedal edema Skin: dark papule under left eye, extensor surfaces of all limbs, with some excoriations   Recent Labs Lab 09/03/15 0358 09/04/15 0338 09/05/15 0823 09/08/15 1506  NA 143 138 136 132*  K 4.6 4.8 3.9 3.8  CL 110 106 104 101  CO2 '24 23 25 24  ' GLUCOSE 93 157* 141* 129*  BUN 25* 22* 15 11  CREATININE 2.24* 2.05* 1.79* 1.64*  ALBUMIN 2.5* 2.8* 3.0* 3.3*  CALCIUM 8.5* 8.8* 9.1 8.2*  PHOS 3.5 3.4  --  3.1  AST  --   --  21  --   ALT  --   --  17  --    Liver Function Tests:  Recent Labs Lab 09/04/15 0338 09/05/15 0823 09/08/15 1506  AST  --  21  --   ALT  --  17  --   ALKPHOS  --  76  --   BILITOT  --  0.4  --   PROT  --  7.3  --   ALBUMIN 2.8* 3.0* 3.3*   CBC:  Recent Labs Lab 09/04/15 0336 09/05/15 0823 09/08/15 1506  WBC 7.3 7.8 7.9  NEUTROABS  --  6.4  --   HGB 7.9* 8.9* 9.2*  HCT 25.8* 29.2* 30.0*  MCV 89.0 89.0 89.0  PLT 102* 148* 125*    CBG:  Recent Labs Lab 09/08/15 0614 09/08/15 1140 09/08/15 1630 09/08/15 2049 09/09/15 0644  GLUCAP 99  140* 122* 158* 110*    Studies/Results: No results found. . bethanechol  10 mg Oral TID  . calcium carbonate  2 tablet Oral TID  . carvedilol  12.5 mg Oral BID WC  . cephALEXin  250 mg Oral Q6H  . ferric gluconate (FERRLECIT/NULECIT) IV  125 mg Intravenous QODAY  . insulin aspart  0-20 Units Subcutaneous TID WC  . insulin aspart  0-5 Units Subcutaneous QHS  . prochlorperazine  5 mg Oral TID AC  . senna-docusate  2 tablet Oral BID    BMET    Component Value Date/Time   NA 132 (L) 09/08/2015 1506   K 3.8 09/08/2015 1506   CL 101 09/08/2015 1506   CO2 24 09/08/2015 1506   GLUCOSE 129 (H) 09/08/2015 1506   BUN 11 09/08/2015 1506   CREATININE  1.64 (H) 09/08/2015 1506   CALCIUM 8.2 (L) 09/08/2015 1506   GFRNONAA 36 (L) 09/08/2015 1506   GFRAA 42 (L) 09/08/2015 1506   CBC    Component Value Date/Time   WBC 7.9 09/08/2015 1506   RBC 3.37 (L) 09/08/2015 1506   HGB 9.2 (L) 09/08/2015 1506   HCT 30.0 (L) 09/08/2015 1506   HCT 20.8 (L) 08/26/2015 1436   PLT 125 (L) 09/08/2015 1506   MCV 89.0 09/08/2015 1506   MCH 27.3 09/08/2015 1506   MCHC 30.7 09/08/2015 1506   RDW 16.0 (H) 09/08/2015 1506   LYMPHSABS 1.0 09/05/2015 0823   MONOABS 0.3 09/05/2015 0823   EOSABS 0.0 09/05/2015 0823   BASOSABS 0.0 09/05/2015 0823     Assessment/Plan:  1. Acute renal failure: SCr improving slowly and it is unclear what level of chronic is present. A moderate degree could be expected likely given the severity of other organ system involvement and autonomic neuropathy. The plan for outpatient follow up with Dr. Hollie Salk for this and her anemia.  2. Bilateral hydronephrosis: Likely neurogenic bladder from her DM as she also has peripheral neuropathy. Currently she is catheter dependent and I agree with outpatient urology follow up. She will need repeat renal US in a few weeks. 1. Concerned about the need for self-catheterization and her ability to perform this as she has neglected her health for the  last 4 years and shows little initiative to do for herself.  She may need assistance at home from family  3. Anemia/thrombocytopenia: Hgb stable at this time. Anemia out of proportion to chronic renal disease and bone marrow biopsy + peripheral smear unremarkable. Possible GI blood loss source seems to be worth further evaluation so recommend a GI consultation. Iron studies are not particularly low but she did receive 4 units pRBCs during this admission now s/p IV supplementation. Aranesp given 8/29 will plan to repeat 2 wks given severe anemia.  4. Orthostatic hypotension: She has evidence of autonomic dysfunction with gastroparesis, neurogenic bladder, probably has some orthostatic hypotension from this. She does not seem volume depleted. Would change her Coreg to a lower dose since this can be exacerbating.  5. Hypocalcemia: resolved with supplementation  6. Metabolic acidosis: resolved 7. PVD s/p right BKA with deconditioning - she informs me that she has had the prosthetic leg since 2013 and never learned how to use it nor has she ambulated with it.  Will need some education and instruction with prosthetic while she is here.    Collier Salina, MD PGY-II Internal Medicine Resident Pager# [phone] 09/09/2015, 11:37 AM  I have seen and examined this patient and agree with plan and assessment in the above note with renal recommendations/intervention highlighted. Need to monitor orthostatic BP readings if the goal is to get her ambulatory with her prosthetic device.  Broadus John A Bertha Lokken,MD 09/09/2015 11:53 AM

## 2015-09-09 NOTE — Progress Notes (Signed)
Occupational Therapy Session Note  Patient Details  Name: [name] MRN: [id number] Date of Birth: 02/06/1965  Today's Date: 09/09/2015   Session 1 OT Individual Time: 1100-1201 OT Individual Time Calculation (min): 61 min   Session 2 OT Individual Time: 1502-1533 OT Individual Time Calculation (min): 31 min   Short Term Goals: Week 1:  OT Short Term Goal 1 (Week 1): Pt will transfer to toilet using LRD with min A OT Short Term Goal 2 (Week 1): Pt will complete LB dressing with Min A OT Short Term Goal 3 (Week 1): Pt will complete bathing with Min A OT Short Term Goal 4 (Week 1): Pt will maintain standing balance for 3 minutes with Min A and RW while reaching within base of support.  Skilled Therapeutic Interventions/Progress Updates:    Session 1 Pt seen for skilled OT treatment with focus on increased independence with self-care tasks; ie. Bathing and dressing. Pt able to complete stand-pivot transfer with RW and Min A from bed>w/c. She then completed bathing at the sink in sitting and standing. Pt able to stand and maintain standing balance W/ Min A and unilateral UE support to wash peri-area and buttocks, as well as pull pants over hips. Pt able to don R sock today with cues for technique. Pt left seated in w/c with lunch tray.  Session 2 Pt seen for second OT session with focus on sit<>stands, standing endurance, and dynamic balance. Pt able to propel w/c down to therapy gym and complete sit<>stands with close supervision. OT utilized dynavision for dynamic standing/reaching as well as visual scanning activity. Pt able to maintain standing balance with min guard and 30% close supervision. Pt able to stand for 2 min bouts x3 trials. Pt returned to room at end of session and left with needs met.   Therapy Documentation Precautions:  Precautions Precautions: Fall Precaution Comments: R BKA 2013 Restrictions Weight Bearing Restrictions: No General:    Pain: Pain  Assessment Pain Assessment: No/denies pain ADL: ADL ADL Comments: see functional navigator Exercises:   Other Treatments:    See Function Navigator for Current Functional Status.   Therapy/Group: Individual Therapy  Elisabeth S Doe 09/09/2015, 5:36 PM 

## 2015-09-09 NOTE — Progress Notes (Signed)
Orthopedic Tech Progress Note Patient Details:  Cheryl HansenMichelle L Salminen 09/09/1965 [id number]  Ortho Devices Type of Ortho Device: Abdominal binder Ortho Device/Splint Interventions: Application   Saul FordyceJennifer C Laderius Valbuena 09/09/2015, 5:31 PM

## 2015-09-10 ENCOUNTER — Inpatient Hospital Stay (HOSPITAL_COMMUNITY): Payer: Medicaid Other | Admitting: Physical Therapy

## 2015-09-10 ENCOUNTER — Encounter (HOSPITAL_COMMUNITY): Payer: Self-pay

## 2015-09-10 ENCOUNTER — Inpatient Hospital Stay (HOSPITAL_COMMUNITY): Payer: Medicaid Other | Admitting: *Deleted

## 2015-09-10 LAB — CBC WITH DIFFERENTIAL/PLATELET
Basophils Absolute: 0 10*3/uL (ref 0.0–0.1)
Basophils Relative: 0 %
Eosinophils Absolute: 0.3 10*3/uL (ref 0.0–0.7)
Eosinophils Relative: 4 %
HCT: 28.9 % — ABNORMAL LOW (ref 36.0–46.0)
Hemoglobin: 9.1 g/dL — ABNORMAL LOW (ref 12.0–15.0)
Lymphocytes Relative: 24 %
Lymphs Abs: 1.6 10*3/uL (ref 0.7–4.0)
MCH: 27.4 pg (ref 26.0–34.0)
MCHC: 31.5 g/dL (ref 30.0–36.0)
MCV: 87 fL (ref 78.0–100.0)
Monocytes Absolute: 0.3 10*3/uL (ref 0.1–1.0)
Monocytes Relative: 5 %
Neutro Abs: 4.2 10*3/uL (ref 1.7–7.7)
Neutrophils Relative %: 66 %
Platelets: 78 10*3/uL — ABNORMAL LOW (ref 150–400)
RBC: 3.32 MIL/uL — ABNORMAL LOW (ref 3.87–5.11)
RDW: 16.3 % — ABNORMAL HIGH (ref 11.5–15.5)
WBC: 6.4 10*3/uL (ref 4.0–10.5)

## 2015-09-10 LAB — GLUCOSE, CAPILLARY
Glucose-Capillary: 79 mg/dL (ref 65–99)
Glucose-Capillary: 94 mg/dL (ref 65–99)
Glucose-Capillary: 123 mg/dL — ABNORMAL HIGH (ref 65–99)
Glucose-Capillary: 105 mg/dL — ABNORMAL HIGH (ref 65–99)

## 2015-09-10 LAB — BASIC METABOLIC PANEL
Anion gap: 14 (ref 5–15)
BUN: 22 mg/dL — ABNORMAL HIGH (ref 6–20)
CO2: 20 mmol/L — ABNORMAL LOW (ref 22–32)
Calcium: 7.8 mg/dL — ABNORMAL LOW (ref 8.9–10.3)
Chloride: 101 mmol/L (ref 101–111)
Creatinine, Ser: 1.74 mg/dL — ABNORMAL HIGH (ref 0.44–1.00)
GFR calc Af Amer: 39 mL/min — ABNORMAL LOW (ref >60)
GFR calc non Af Amer: 34 mL/min — ABNORMAL LOW (ref >60)
Glucose, Bld: 94 mg/dL (ref 65–99)
Potassium: 3.7 mmol/L (ref 3.5–5.1)
Sodium: 135 mmol/L (ref 135–145)

## 2015-09-10 MED ORDER — PRO-STAT SUGAR FREE PO LIQD
30.0000 mL | Freq: Two times a day (BID) | ORAL | Status: DC
  Administered 2015-09-10 – 2015-09-18 (×16): 30 mL via ORAL
  Filled 2015-09-10 (×16): qty 30

## 2015-09-10 MED ORDER — ADULT MULTIVITAMIN W/MINERALS CH
1.0000 | ORAL_TABLET | Freq: Every day | ORAL | Status: DC
  Administered 2015-09-10 – 2015-09-18 (×8): 1 via ORAL
  Filled 2015-09-10 (×8): qty 1

## 2015-09-10 MED ORDER — B COMPLEX-C PO TABS
1.0000 | ORAL_TABLET | Freq: Every day | ORAL | Status: DC
  Administered 2015-09-10 – 2015-09-18 (×8): 1 via ORAL
  Filled 2015-09-10 (×8): qty 1

## 2015-09-10 MED ORDER — BETHANECHOL CHLORIDE 25 MG PO TABS
25.0000 mg | ORAL_TABLET | Freq: Three times a day (TID) | ORAL | Status: DC
  Administered 2015-09-10: 25 mg via ORAL
  Filled 2015-09-10: qty 1

## 2015-09-10 MED ORDER — CARVEDILOL 6.25 MG PO TABS
6.2500 mg | ORAL_TABLET | Freq: Two times a day (BID) | ORAL | Status: DC
  Administered 2015-09-11 – 2015-09-18 (×14): 6.25 mg via ORAL
  Filled 2015-09-10 (×15): qty 1

## 2015-09-10 NOTE — Progress Notes (Signed)
Physical Therapy Session Note  Patient Details  Name: Earvin HansenMichelle L Noack MRN: [id number] Date of Birth: 04/21/1965  Today's Date: 09/10/2015 PT Individual Time: 1520-1615 PT Individual Time Calculation (min): 55 min    Short Term Goals: Week 1:  PT Short Term Goal 1 (Week 1): Pt will ambulate 10 ft with RW & mod A. PT Short Term Goal 2 (Week 1): Pt will initiate stair training. PT Short Term Goal 3 (Week 1): Pt will propel w/c 150 ft with supervision.   Skilled Therapeutic Interventions/Progress Updates:     [facility].  Patient performed WC mobility training for 13325ft with instruction from PT for improved doorway management.   Patient instructed by PT in squat pivot transfer with supervision A x 2 and stand pivot transfer with RW and min A from PT. Max cues for AD management and proper use of BUE to allow rotation of foot into position.   PT instructed patient in gait training for 110ft in Parallel bars with min A and max verbal and tactile cues for improved use of BUE to perform swing to gait pattern. PT also instructed patient in gait training for 17 ft with min A and max cues for AD management and proper swing-to gait pattern.   Patient performed press ups from EOB with handles 2 x 10 with max cues for improved low trap activation and improve activation with gait.   Patient returned to room and [facility] with call bell in reach.     Therapy Documentation Precautions:  Precautions Precautions: Fall Precaution Comments: R BKA 2013 Restrictions Weight Bearing Restrictions: (P) No General:   Missed minutes: 20 ( RN care) Vital Signs: Therapy Vitals Temp: 98.6 F (37 C) Temp Source: Oral Resp: 18 Oxygen Therapy SpO2: 97 % O2 Device: Not Delivered Pain: Pain Assessment Pain Assessment: No/denies pain   See Function Navigator for Current Functional Status.   Therapy/Group: Individual Therapy  Golden Popustin E  Tinisha Etzkorn 09/10/2015, 4:45 PM

## 2015-09-10 NOTE — Progress Notes (Signed)
[place] PHYSICAL MEDICINE & REHABILITATION     PROGRESS NOTE  Subjective/Complaints:  Pt laying in bed.  She slept well and notes. 1 episode of orthostasis yesterday. She continues to have urinary retention.    ROS:  +Urinary retention. Denies CP, SOB, N/V/D.  Objective: Vital Signs: Blood pressure 128/67, pulse 90, temperature 99.6 F (37.6 C), temperature source Oral, resp. rate 17, height 5\' 7"  (1.702 m), weight 91.4 kg (201 lb 9.6 oz), SpO2 96 %. No results found.  Recent Labs  09/08/15 1506 09/10/15 0533  WBC 7.9 6.4  HGB 9.2* 9.1*  HCT 30.0* 28.9*  PLT 125* 78*    Recent Labs  09/08/15 1506 09/10/15 0533  NA 132* 135  K 3.8 3.7  CL 101 101  GLUCOSE 129* 94  BUN 11 22*  CREATININE 1.64* 1.74*  CALCIUM 8.2* 7.8*   CBG (last 3)   Recent Labs  09/09/15 1628 09/09/15 2155 09/10/15 0632  GLUCAP 108* 114* 79    Wt Readings from Last 3 Encounters:  09/09/15 91.4 kg (201 lb 9.6 oz)  09/03/15 101 kg (222 lb 10.6 oz)  12/14/12 90.7 kg (200 lb)    Physical Exam:  BP 128/67 (BP Location: Left Arm)   Pulse 90   Temp 99.6 F (37.6 C) (Oral)   Resp 17   Ht 5\' 7"  (1.702 m)   Wt 91.4 kg (201 lb 9.6 oz)   LMP  (LMP Unknown)   SpO2 96%   BMI 31.58 kg/m  Constitutional: She appears well-developed and well-nourished. Obese  HENT: Normocephalic and atraumatic.  Eyes: Conjunctivae and EOM are normal.  Cardiovascular: Normal rate and regular rhythm.  No murmur heard. Respiratory: Effort normal and breath sounds normal.  GI: Soft. Bowel sounds are normal. She exhibits no distension. There is no tenderness.  Musculoskeletal: She exhibits edema. No tenderness. R-BKA well healed. Neurological: She is alert and oriented.  Speech clear and follows commands without difficulty.   Sensory deficits LLE, right residual limb and bilateral hands.  Motor: B/l UE: 4/5 proximal to distal LLE: 4+/5 hip flexion, knee extension, 4+/5 ankle dorsi/plantar flexion RLE: 4+/5 hip  flexion, knee extension  Skin: Skin is warm and dry. Numerous skin lesions. Right inguinal groin area with small amount of drainage and induration (improving) Psychiatric: She has flat mood and affect. Her behavior is normal. Thought content normal.   Assessment/Plan: 1. Functional deficits secondary to debility which require 3+ hours per day of interdisciplinary therapy in a comprehensive inpatient rehab setting. Physiatrist is providing close team supervision and 24 hour management of active medical problems listed below. Physiatrist and rehab team continue to assess barriers to discharge/monitor patient progress toward functional and medical goals.  Function:  Bathing Bathing position   Position: Wheelchair/chair at sink  Bathing parts Body parts bathed by patient: Right arm, Left arm, Chest, Abdomen, Front perineal area, Right upper leg, Left upper leg, Right lower leg, Left lower leg Body parts bathed by helper: Buttocks  Bathing assist Assist Level: Touching or steadying assistance(Pt > 75%)      Upper Body Dressing/Undressing Upper body dressing   What is the patient wearing?: Pull over shirt/dress     Pull over shirt/dress - Perfomed by patient: Thread/unthread right sleeve, Thread/unthread left sleeve, Put head through opening, Pull shirt over trunk          Upper body assist Assist Level: More than reasonable time, Set up   Set up : To obtain clothing/put away  Lower Body  Dressing/Undressing Lower body dressing   What is the patient wearing?: Non-skid slipper socks, Pants     Pants- Performed by patient: Thread/unthread right pants leg, Thread/unthread left pants leg Pants- Performed by helper: Pull pants up/down Non-skid slipper socks- Performed by patient: Don/doff left sock Non-skid slipper socks- Performed by helper: Don/doff right sock               TED Hose - Performed by helper: Don/doff right TED hose  Lower body assist Assist for lower body  dressing: Touching or steadying assistance (Pt > 75%)      Toileting Toileting Toileting activity did not occur: No continent bowel/bladder event   Toileting steps completed by helper: Adjust clothing prior to toileting, Performs perineal hygiene, Adjust clothing after toileting (per Melton AlarHawa Khelleh, NT )    Toileting assist     Transfers Chair/bed transfer   Chair/bed transfer method: Stand pivot Chair/bed transfer assist level: Supervision or verbal cues Chair/bed transfer assistive device: Armrests, Bedrails     Locomotion Ambulation Ambulation activity did not occur: Safety/medical concerns   Max distance: 8 ft Assist level: Touching or steadying assistance (Pt > 75%)   Wheelchair   Type: Manual Max wheelchair distance: 200 ft Assist Level: Supervision or verbal cues  Cognition Comprehension Comprehension assist level: Understands basic 90% of the time/cues < 10% of the time  Expression Expression assist level: Expresses basic 90% of the time/requires cueing < 10% of the time.  Social Interaction Social Interaction assist level: Interacts appropriately 90% of the time - Needs monitoring or encouragement for participation or interaction.  Problem Solving Problem solving assist level: Solves complex 90% of the time/cues < 10% of the time  Memory Memory assist level: Recognizes or recalls 90% of the time/requires cueing < 10% of the time      Medical Problem List and Plan: 1. Deconditioning secondary to multiple medical issues including severe anemia, acute renal failure  continue CIR 2. DVT Prophylaxis/Anticoagulation: Mechanical: Sequential compression devices, below knee Bilateral lower extremities 3. Pain Management: Norco prn for chronic back pain 4. Mood: Team to provide ego support. LCSW to follow for evaluation and support.  5. Neuropsych: This patient iscapable of making decisions on herown behalf. 6. Skin/Wound Care: Aquacel Silver for MASD gluteal cleft.  7.  Fluids/Electrolytes/Nutrition:   Scheduled compazine before meals for ongoing nausea, ?gastroparesis.   -added megace for appetite 400mg  daily 10 T2DM with neuropathy, nephropathy, vasculopathy and ?gastroparesis:   Monitor BS ac/hs.   SSI for elevated BS.   Relatively stable 11. CKD: Avoid NSAIDs and other nephrotoxic drugs.   Hypocalcemia managed with tums. Monitor weights daily.   D/ced IVF 9/4  Encourage fluids, may need IVF again 12. Bilateral hydronephrosis due to chronic urinary retention:   D/ced foley 9/4  Voiding trial  Urocholine 10mg  TID started on 9/5, increased to 25 on 9/6  Pt will likely need Urology follow up as outpt 13. ABLA on Anemia of chronic disease: On IV iron for supplement--received dose of Aransep on 8/29.  Hb up to 9.1 on 9/6 (slowly improving)  Heme Occult +  Awaiting GI consult 14. Thrombocytopenia: Monitor with serial checks  Plts up to 78 on 9/6  Meds reviewed, will cont to monitor 15. Constipation  Bowel reg increased on 9/1 16. HTN: Improving  Coreg 6.25, increased to 12.5 on 9/5  Cont to monitor 17. Folliculitis  Right inguinal area  Cont Keflex 9/4 x7 days.  LOS (Days) 6 A FACE TO FACE EVALUATION  WAS PERFORMED  Kadasia Kassing Karis Juba 09/10/2015 8:16 AM

## 2015-09-10 NOTE — Patient Care Conference (Signed)
Inpatient RehabilitationTeam Conference and Plan of Care Update Date: 09/10/2015   Time: 2:20 PM    Patient Name: Cheryl HansenMichelle L Wolf      Medical Record Number: [id number]  Date of Birth: 09/03/1965 Sex: Female         Room/Bed: 4W19C/4W19C-01 Payor Info: Payor: MEDICAID PENDING / Plan: MEDICAID PENDING / Product Type: *No Product type* /    Admitting Diagnosis: DEBILITY  Admit Date/Time:  09/04/2015  5:06 PM Admission Comments: No comment available   Primary Diagnosis:  <principal problem not specified> Principal Problem: <principal problem not specified>  Patient Active Problem List   Diagnosis Date Noted  . Benign essential HTN   . Folliculitis   . Slow transit constipation   . Anemia of chronic disease   . Other hydronephrosis   . Urinary retention   . CKD (chronic kidney disease)   . Uncontrolled type 2 diabetes mellitus with diabetic nephropathy, without long-term current use of insulin (HCC)   . Vasculopathy   . Debility 09/04/2015  . AKI (acute kidney injury) (HCC)   . DM type 2 with diabetic peripheral neuropathy (HCC)   . S/P BKA (below knee amputation) unilateral (HCC)   . Medical non-compliance   . Benign skin lesion of multiple sites   . Tachypnea   . Acute blood loss anemia   . Neurogenic bladder   . Occult blood positive stool   . Rectovaginal fistula   . Controlled diabetes mellitus type 2 with complications (HCC)   . Acute on chronic renal failure (HCC)   . Hydronephrosis determined by ultrasound   . Papular rash   . Uncontrolled type 2 diabetes mellitus with complication (HCC)   . Paresthesia   . Thrombocytopenia (HCC) 08/23/2015  . Pressure ulcer 08/23/2015  . Severe anemia 08/23/2015  . UTI (lower urinary tract infection) 08/23/2015  . Absolute anemia 08/23/2015  . Peripheral vascular disease, unspecified (HCC) 04/27/2012  . Unilateral complete BKA (HCC) 07/09/2011  . Gas gangrene (HCC) 07/02/2011  . Sepsis(995.91) 07/02/2011  . Acute renal failure  (HCC) 07/02/2011  . Diabetes mellitus (HCC) 11/22/2010  . Diabetic foot ulcer associated with type 2 diabetes mellitus (HCC)   . Hypertension   . Arthritis   . Neuropathy (HCC)   . Intracerebral hemorrhage [facility](HCC)     Expected Discharge Date: Expected Discharge Date: 09/17/15  Team Members Present: Physician leading conference: Dr. Maryla MorrowAnkit Patel Social Worker Present: Dossie DerBecky Lamica Mccart, LCSW Nurse Present: Carmie EndAngie Joyce, RN PT Present: Grier RocherAustin Tucker, PT;Victoria Hyacinth MeekerMiller, PT OT Present: Roney MansJennifer Smith, OT;Other (comment) Gentry Fitz(Elisabeth Doe-OT) PPS Coordinator present : Tora DuckMarie Noel, RN, CRRN     Current Status/Progress Goal Weekly Team Focus  Medical   Deconditioning secondary to multiple medical issues including severe anemia, acute renal failure with multiple medical problems  Improve safety, mobility, improve multiple medical issues  See above   Bowel/Bladder   Continent with periods of incontinet of bowels, i/o cath q4, LBM 09-09-15  Continent of bowel and bladder  Monitor bowel and bladder q shift and prn    Swallow/Nutrition/ Hydration             ADL's   Min A LB ADLs, Min A sit<>stand, min gaurd standing balance at sink, Min A BSC transfers  Mod I bathing, dressing, toileting, grooming, and ModI for sinmple meal prep and light housework  IADL and ADL training, standing balance/endurance, activity tolerance, functional transfer   Mobility   min A for modified stand pivot transfer, w/c mobility for max of  100 ft with supervision, ambulates maximum of 8 ft with RW & min A with poor foot clearance, poor endurance & strength  mod I for bed mobility & transfers (bed<>chair), supervision for car transfer, supervision standing balance, ambulate 25 ft with supervision & RW, negotiate 1 step with RW, mod I for w/c in home & supervision in community  endurance training, transfer training, gait training, pt education   Communication             Safety/Cognition/ Behavioral Observations             Pain   No complaints of pain   <4  Monitor q shift and prn   Skin   scatter skin lesions, ulcer on bottom ( yellow slough) with allevyn, libia lesion  No new skin infection/breakdown  Monitor skin q shift and prn      *See Care Plan and progress notes for long and short-term goals.  Barriers to Discharge: CKD, ABLA, Neurogenic bladder, thrombocytopenia, folliculitis, anemia, ill-fitting BKA, profound, wounds    Possible Resolutions to Barriers:  Therapies, follow labs, GI consult, optimize meds, prosthetic adjustment    Discharge Planning/Teaching Needs:  Home with young children and Mom to stay with short time for transition home. Pt needs to be mod/i wheelchair level which she was before admission.      Team Discussion:  Goals-mod/i wheelchair level-supervision with ambulation 25 ft. RN can work on I & O cath if realistic for pt. May go home with a foley. Using binder and ted hose for BP. Has prothesis here will contact [employer] to check on while here. Pt is feeling better and not as nauseous.  Revisions to Treatment Plan:  DC 9/13   Continued Need for Acute Rehabilitation Level of Care: The patient requires daily medical management by a physician with specialized training in physical medicine and rehabilitation for the following conditions: Daily direction of a multidisciplinary physical rehabilitation program to ensure safe treatment while eliciting the highest outcome that is of practical value to the patient.: Yes Daily medical management of patient stability for increased activity during participation in an intensive rehabilitation regime.: Yes Daily analysis of laboratory values and/or radiology reports with any subsequent need for medication adjustment of medical intervention for : Neurological problems;Diabetes problems;Wound care problems;Renal problems;Urological problems;Other  Jacqulene Huntley, Lemar Livings 09/10/2015, 3:30 PM

## 2015-09-10 NOTE — Consult Note (Signed)
[facility] Consult Note  Referring Provider: No ref. provider found Primary Care Physician:  FULP, CAMMIE, MD Primary Gastroenterologist:  Dr.  Chief Complaint: Consult for anemia and heme positive stool HPI: [name] is an 49 y.o. black female  status post recent admission for acute renal failure severe anemia and anasarca as well as hydronephrosis and ureteral reflux. She was admitted to rehabilitation for continued recovery from these issues. She has had a normocytic anemia since admission and was found to have heme positive stools on 2 occasions. She has had a hematology workup they recommended GI consult. The patient has never had a colonoscopy or EGD. She has no family history of GI malignancy. She denies any significant GI symptoms including nausea vomiting dysphagia significant abdominal pain. She has had some occasional small-volume hematochezia usually associated with episodes of constipation. She denies use of nonsteroidal anti-inflammatory drugs.  Past Medical History:  Diagnosis Date  . Arthritis   . Diabetes mellitus   . Diabetic foot ulcer associated with type 2 diabetes mellitus (HCC)   . Hypertension   . Intracerebral hemorrhage (HCC) As a teenager    States she had burst blood vessel as teenager, now with resultant minor visual field and hearing deficits  . Neuropathy (HCC)   . Stroke (HCC)     Past Surgical History:  Procedure Laterality Date  . AMPUTATION  11/24/2010   Procedure: AMPUTATION FOOT;  Surgeon:  Hewitt, MD;  Location: [facility] OR;  Service: Orthopedics;  Laterality: Right;  Right  FOOT TRANS METATARSAL AMPUTATION  . AMPUTATION  07/02/2011   Procedure: AMPUTATION BELOW KNEE;  Surgeon:  Hewitt, MD;  Location: [facility] OR;  Service: Orthopedics;  Laterality: Right;  . APPLICATION OF WOUND VAC  07/02/2011   Procedure: APPLICATION OF WOUND VAC;  Surgeon:  Hewitt, MD;  Location: [facility] OR;  Service: Orthopedics;  Laterality: Right;  . BRAIN  SURGERY  1980's   Previous brain surgery in [place], [place]  . CESAREAN SECTION    . I&D EXTREMITY  11/24/2010   Procedure: IRRIGATION AND DEBRIDEMENT EXTREMITY;  Surgeon:  Hewitt, MD;  Location: [facility] OR;  Service: Orthopedics;  Laterality: Left;  Debriedment of left plantar ulcer and trimming of toenails  . I&D EXTREMITY  07/02/2011   Procedure: IRRIGATION AND DEBRIDEMENT EXTREMITY;  Surgeon:  Hewitt, MD;  Location: [facility] OR;  Service: Orthopedics;  Laterality: Left;  I & D of Left Foot  . I&D EXTREMITY  07/06/2011   Procedure: IRRIGATION AND DEBRIDEMENT EXTREMITY;  Surgeon:  Hewitt, MD;  Location: [facility] OR;  Service: Orthopedics;  Laterality: Right;  IRRIGATION/DEBRIDEMENT RIGHT BELOW KNEE AMPUTATION    Medications Prior to Admission  Medication Sig Dispense Refill  . aspirin 81 MG EC tablet Take 162-243 mg by mouth 3 (three) times daily as needed (chest pain). Swallow whole.    . Aspirin-Acetaminophen-Caffeine (GOODY HEADACHE PO) Take 1 packet by mouth 2 (two) times daily as needed (headache).    . Menthol, Topical Analgesic, (ICY HOT EX) Apply 1 application topically 2 (two) times daily as needed (pain).    . Naphazoline HCl (CLEAR EYES OP) Place 1 drop into both eyes daily as needed (dry eyes/itching).      Allergies: No Known Allergies  Family History  Problem Relation Age of Onset  . Diabetes Mother   . Hypertension Mother   . Hyperlipidemia Mother   . Diabetes Father   . Cancer Father     prostate  . Hyperlipidemia Father   . Hypertension Father   .   Diabetes Brother   . Peripheral Artery Disease Brother     has had toes amputated    Social History:  reports that she has never smoked. She has never used smokeless tobacco. She reports that she does not drink alcohol or use drugs.  Review of Systems: negative except As above   Blood pressure (!) 95/55, pulse 82, temperature 99.6 F (37.6 C), temperature source Oral, resp. rate 17, height 5' 7" (1.702 m), weight 91.4 kg  (201 lb 9.6 oz), SpO2 96 %. Head: Normocephalic, without obvious abnormality, atraumatic Neck: no adenopathy, no carotid bruit, no JVD, supple, symmetrical, trachea midline and thyroid not enlarged, symmetric, no tenderness/mass/nodules Resp: clear to auscultation bilaterally Cardio: regular rate and rhythm, S1, S2 normal, no murmur, click, rub or gallop GI: Abdomen soft nondistended with normoactive bowel sounds. No hepatosplenomegaly mass or guarding. Extremities: extremities normal, atraumatic, no cyanosis or edema  Results for orders placed or performed during the hospital encounter of 09/04/15 (from the past 48 hour(s))  Culture, Urine     Status: Abnormal   Collection Time: 09/08/15  1:19 PM  Result Value Ref Range   Specimen Description URINE, CATHETERIZED    Special Requests keflex    Culture <10,000 COLONIES/mL INSIGNIFICANT GROWTH (A)    Report Status 09/09/2015 FINAL   Renal function panel     Status: Abnormal   Collection Time: 09/08/15  3:06 PM  Result Value Ref Range   Sodium 132 (L) 135 - 145 mmol/L   Potassium 3.8 3.5 - 5.1 mmol/L   Chloride 101 101 - 111 mmol/L   CO2 24 22 - 32 mmol/L   Glucose, Bld 129 (H) 65 - 99 mg/dL   BUN 11 6 - 20 mg/dL   Creatinine, Ser 1.64 (H) 0.44 - 1.00 mg/dL   Calcium 8.2 (L) 8.9 - 10.3 mg/dL   Phosphorus 3.1 2.5 - 4.6 mg/dL   Albumin 3.3 (L) 3.5 - 5.0 g/dL   GFR calc non Af Amer 36 (L) >60 mL/min   GFR calc Af Amer 42 (L) >60 mL/min    Comment: (NOTE) The eGFR has been calculated using the CKD EPI equation. This calculation has not been validated in all clinical situations. eGFR's persistently <60 mL/min signify possible Chronic Kidney Disease.    Anion gap 7 5 - 15  CBC     Status: Abnormal   Collection Time: 09/08/15  3:06 PM  Result Value Ref Range   WBC 7.9 4.0 - 10.5 K/uL   RBC 3.37 (L) 3.87 - 5.11 MIL/uL   Hemoglobin 9.2 (L) 12.0 - 15.0 g/dL   HCT 30.0 (L) 36.0 - 46.0 %   MCV 89.0 78.0 - 100.0 fL   MCH 27.3 26.0 - 34.0  pg   MCHC 30.7 30.0 - 36.0 g/dL   RDW 16.0 (H) 11.5 - 15.5 %   Platelets 125 (L) 150 - 400 K/uL  Glucose, capillary     Status: Abnormal   Collection Time: 09/08/15  4:30 PM  Result Value Ref Range   Glucose-Capillary 122 (H) 65 - 99 mg/dL  Urinalysis, Routine w reflex microscopic (not at [facility])     Status: Abnormal   Collection Time: 09/08/15  5:03 PM  Result Value Ref Range   Color, Urine YELLOW YELLOW   APPearance HAZY (A) CLEAR   Specific Gravity, Urine 1.006 1.005 - 1.030   pH 6.5 5.0 - 8.0   Glucose, UA NEGATIVE NEGATIVE mg/dL   Hgb urine dipstick LARGE (A) NEGATIVE     Bilirubin Urine NEGATIVE NEGATIVE   Ketones, ur NEGATIVE NEGATIVE mg/dL   Protein, ur 30 (A) NEGATIVE mg/dL   Nitrite NEGATIVE NEGATIVE   Leukocytes, UA LARGE (A) NEGATIVE  Urine microscopic-add on     Status: Abnormal   Collection Time: 09/08/15  5:03 PM  Result Value Ref Range   Squamous Epithelial / LPF 0-5 (A) NONE SEEN   WBC, UA 6-30 0 - 5 WBC/hpf   RBC / HPF 0-5 0 - 5 RBC/hpf   Bacteria, UA FEW (A) NONE SEEN  Glucose, capillary     Status: Abnormal   Collection Time: 09/08/15  8:49 PM  Result Value Ref Range   Glucose-Capillary 158 (H) 65 - 99 mg/dL   Comment 1 Notify RN   Glucose, capillary     Status: Abnormal   Collection Time: 09/09/15  6:44 AM  Result Value Ref Range   Glucose-Capillary 110 (H) 65 - 99 mg/dL   Comment 1 Notify RN   Glucose, capillary     Status: Abnormal   Collection Time: 09/09/15 11:37 AM  Result Value Ref Range   Glucose-Capillary 108 (H) 65 - 99 mg/dL  Glucose, capillary     Status: Abnormal   Collection Time: 09/09/15  4:28 PM  Result Value Ref Range   Glucose-Capillary 108 (H) 65 - 99 mg/dL  Glucose, capillary     Status: Abnormal   Collection Time: 09/09/15  9:55 PM  Result Value Ref Range   Glucose-Capillary 114 (H) 65 - 99 mg/dL  Occult blood card to lab, stool     Status: Abnormal   Collection Time: 09/09/15 10:00 PM  Result Value Ref Range   Fecal Occult  Bld POSITIVE (A) NEGATIVE  Basic metabolic panel     Status: Abnormal   Collection Time: 09/10/15  5:33 AM  Result Value Ref Range   Sodium 135 135 - 145 mmol/L   Potassium 3.7 3.5 - 5.1 mmol/L   Chloride 101 101 - 111 mmol/L   CO2 20 (L) 22 - 32 mmol/L   Glucose, Bld 94 65 - 99 mg/dL   BUN 22 (H) 6 - 20 mg/dL   Creatinine, Ser 1.74 (H) 0.44 - 1.00 mg/dL   Calcium 7.8 (L) 8.9 - 10.3 mg/dL   GFR calc non Af Amer 34 (L) >60 mL/min   GFR calc Af Amer 39 (L) >60 mL/min    Comment: (NOTE) The eGFR has been calculated using the CKD EPI equation. This calculation has not been validated in all clinical situations. eGFR's persistently <60 mL/min signify possible Chronic Kidney Disease.    Anion gap 14 5 - 15  CBC with Differential/Platelet     Status: Abnormal   Collection Time: 09/10/15  5:33 AM  Result Value Ref Range   WBC 6.4 4.0 - 10.5 K/uL   RBC 3.32 (L) 3.87 - 5.11 MIL/uL   Hemoglobin 9.1 (L) 12.0 - 15.0 g/dL    Comment: CONSISTENT WITH PREVIOUS RESULT   HCT 28.9 (L) 36.0 - 46.0 %   MCV 87.0 78.0 - 100.0 fL   MCH 27.4 26.0 - 34.0 pg   MCHC 31.5 30.0 - 36.0 g/dL   RDW 16.3 (H) 11.5 - 15.5 %   Platelets 78 (L) 150 - 400 K/uL    Comment: PLATELET COUNT CONFIRMED BY SMEAR   Neutrophils Relative % 66 %   Neutro Abs 4.2 1.7 - 7.7 K/uL   Lymphocytes Relative 24 %   Lymphs Abs 1.6 0.7 - 4.0 K/uL     Monocytes Relative 5 %   Monocytes Absolute 0.3 0.1 - 1.0 K/uL   Eosinophils Relative 4 %   Eosinophils Absolute 0.3 0.0 - 0.7 K/uL   Basophils Relative 0 %   Basophils Absolute 0.0 0.0 - 0.1 K/uL  Glucose, capillary     Status: None   Collection Time: 09/10/15  6:32 AM  Result Value Ref Range   Glucose-Capillary 79 65 - 99 mg/dL   Comment 1 Notify RN   Glucose, capillary     Status: None   Collection Time: 09/10/15 11:36 AM  Result Value Ref Range   Glucose-Capillary 94 65 - 99 mg/dL   No results found.  Assessment: Anemia, with chronic disease component but with heme  positive stools. Plan:  Recommend colonoscopy with EGD at some point. The patient is to be on rehabilitation for at least a week. We will check in 3-5days and see if patient wants to proceed as inpatient when more acute problems are optimized or follow-up with us as an outpatient. , C 09/10/2015, 12:17 PM  Pager [phone] If no answer or after 5 PM call [phone]  

## 2015-09-10 NOTE — Progress Notes (Signed)
Occupational Therapy Session Note  Patient Details  Name: [name] MRN: [id number] Date of Birth: 07/20/1965  Today's Date: 09/10/2015 OT Individual Time: [id number] OT Individual Time Calculation (min): 54 min   Short Term Goals: Week 1:  OT Short Term Goal 1 (Week 1): Pt will transfer to toilet using LRD with min A OT Short Term Goal 2 (Week 1): Pt will complete LB dressing with Min A OT Short Term Goal 3 (Week 1): Pt will complete bathing with Min A OT Short Term Goal 4 (Week 1): Pt will maintain standing balance for 3 minutes with Min A and RW while reaching within base of support.  Skilled Therapeutic Interventions/Progress Updates: Pt seen as COTX for 1/2 of session with Rec Therapist. Pt transferred to sitting EOB, donned pants with set-up A, stood to pull up pants with contact guard for balance, then completed modified squat pivot from bed>w/c with supervision. Pt reported onset of dizziness BP 95/55. Pt continued to have orthostatic symptoms so returned to supine with contact guard. After 5 mins in supine, BP 123/56 w/ symptoms resolved. Orthostatics completed as noted below. OT deferred further OOB activities 2/2 hypotension. Pt performed bed level there ex with HOB raised. Pt with improved symptoms and left with bed in chair position. Will use abdominal binder and TED hose next session.    Therapy Documentation Precautions: Precautions Precautions: Fall Precaution Comments: R BKA 2013 Restrictions Weight Bearing Restrictions: No General:   Vital Signs: Therapy Vitals Pulse Rate: 82 BP: (!) 95/55 (after squat pivot transfer sitting in w/c) Patient Position (if appropriate): Sitting     09/10/15 0900  Vital Signs  Pulse Rate 82  BP (!) 95/55 (after squat pivot transfer sitting in w/c)  BP Location Right Arm  BP Method Automatic  Patient Position (if appropriate) Sitting  Orthostatic Lying   BP- Lying 123/56  Pulse- Lying 77  Orthostatic Sitting  BP- Sitting  (!) 83/46  Pulse- Sitting 53  Orthostatic Standing at 0 minutes  BP- Standing at 0 minutes (!) 86/46  Pulse- Standing at 0 minutes 79  Pain Assessment  Pain Assessment No/denies pain   ADL: ADL ADL Comments: see functional navigator  See Function Navigator for Current Functional Status.   Therapy/Group: Individual Therapy  Mal Amabilelisabeth S Dustine Bertini 09/10/2015, 10:18 AM

## 2015-09-10 NOTE — Progress Notes (Signed)
Physical Therapy Session Note  Patient Details  Name: Earvin HansenMichelle L Mckneely MRN: [id number] Date of Birth: 05/24/1965  Today's Date: 09/10/2015 PT Individual Time: [id number] PT Individual Time Calculation (min): 54 min    Short Term Goals: Week 1:  PT Short Term Goal 1 (Week 1): Pt will ambulate 10 ft with RW & mod A. PT Short Term Goal 2 (Week 1): Pt will initiate stair training. PT Short Term Goal 3 (Week 1): Pt will propel w/c 150 ft with supervision.   Skilled Therapeutic Interventions/Progress Updates:    Pt received sitting up in bed receiving medication from RN & agreeable to PT. Pt denied c/o pain at rest. Educated pt on use of abdominal binder & provided total assist to don binder. assessed pt's vitals:  Sitting = 113/65 mmHg Standing at 0 minutes = 92/51 mmHg Standing at 3 minutes = 91/61 mmHg Pt asymptomatic throughout BP assessment & session.  Therapist donned pt's LLE ted hose total assist to assist with BP management. Session focused on transfers (bed>w/c and car) and w/c mobility. Pt performed modified stand pivot transfers bed>w/c without AD and supervision assist. Pt propelled w/c x 200 ft + 200 ft room<>ortho gym with BUE & supervision requiring significantly extra time to for task; activity focused on w/c mobility, BUE strengthening, and endurance training. Practiced car transfer at simulated low truck height. Educated pt not to hold on to car door due to it's instability. Pt demonstrated proper positioning of w/c by car but required max cuing for hand placement on w/c armrests & simulated car handle when completing transfer. Pt able to complete 2 car transfers with supervision assistance and encouragement. At end of session pt left in w/c in room with all needs within reach.  Therapy Documentation Precautions:  Precautions Precautions: Fall Precaution Comments: R BKA 2013 Restrictions Weight Bearing Restrictions: (P) No  Pain: Pain Assessment Pain Assessment:  No/denies pain   See Function Navigator for Current Functional Status.   Therapy/Group: Individual Therapy  Sandi MariscalVictoria M Derrell Milanes 09/10/2015, 2:39 PM

## 2015-09-10 NOTE — Evaluation (Signed)
Recreational Therapy Assessment and Plan  Patient Details  Name: [name] MRN: [id number] Date of Birth: March 27, 1965 Today's Date: 09/10/2015  Rehab Potential: Good ELOS: 2 weeks   Assessment Clinical Impression:Problem List:      Patient Active Problem List   Diagnosis Date Noted  . Slow transit constipation   . Anemia of chronic disease   . Other hydronephrosis   . Urinary retention   . CKD (chronic kidney disease)   . Uncontrolled type 2 diabetes mellitus with diabetic nephropathy, without long-term current use of insulin ([place])   . Vasculopathy   . Debility 09/04/2015  . AKI (acute kidney injury) ([place])   . DM type 2 with diabetic peripheral neuropathy ([place])   . S/P BKA (below knee amputation) unilateral ([place])   . Medical non-compliance   . Benign skin lesion of multiple sites   . Tachypnea   . Acute blood loss anemia   . Neurogenic bladder   . Occult blood positive stool   . Rectovaginal fistula   . Controlled diabetes mellitus type 2 with complications ([place])   . Acute on chronic renal failure ([place])   . Hydronephrosis determined by ultrasound   . Papular rash   . Uncontrolled type 2 diabetes mellitus with complication ([employer])   . Paresthesia   . Thrombocytopenia ([place]) 08/23/2015  . Pressure ulcer 08/23/2015  . Severe anemia 08/23/2015  . UTI (lower urinary tract infection) 08/23/2015  . Absolute anemia 08/23/2015  . Peripheral vascular disease, unspecified ([place]) 04/27/2012  . Unilateral complete BKA ([place]) 07/09/2011  . Gas gangrene ([place]) 07/02/2011  . Sepsis(995.91) 07/02/2011  . Acute renal failure ([place]) 07/02/2011  . Diabetes mellitus ([place]) 11/22/2010  . Diabetic foot ulcer associated with type 2 diabetes mellitus ([place])   . Hypertension   . Arthritis   . Neuropathy ([place])   . Intracerebral hemorrhage [facility])     Past Medical History:      Past Medical History:  Diagnosis Date  . Arthritis   . Diabetes mellitus   .  Diabetic foot ulcer associated with type 2 diabetes mellitus ([place])   . Hypertension   . Intracerebral hemorrhage ([place]) As a teenager    States she had burst blood vessel as teenager, now with resultant minor visual field and hearing deficits  . Neuropathy ([place])   . Stroke [facility])    Past Surgical History:       Past Surgical History:  Procedure Laterality Date  . AMPUTATION  11/24/2010   Procedure: AMPUTATION FOOT;  Surgeon: Wylene Simmer, MD;  Location: [facility];  Service: Orthopedics;  Laterality: Right;  Right  FOOT TRANS METATARSAL AMPUTATION  . AMPUTATION  07/02/2011   Procedure: AMPUTATION BELOW KNEE;  Surgeon: Wylene Simmer, MD;  Location: [facility];  Service: Orthopedics;  Laterality: Right;  . APPLICATION OF WOUND VAC  07/02/2011   Procedure: APPLICATION OF WOUND VAC;  Surgeon: Wylene Simmer, MD;  Location: [place];  Service: Orthopedics;  Laterality: Right;  . BRAIN SURGERY  1980's   Previous brain surgery in [place], [place]  . CESAREAN SECTION    . I&D EXTREMITY  11/24/2010   Procedure: IRRIGATION AND DEBRIDEMENT EXTREMITY;  Surgeon: Wylene Simmer, MD;  Location: [facility];  Service: Orthopedics;  Laterality: Left;  Debriedment of left plantar ulcer and trimming of toenails  . I&D EXTREMITY  07/02/2011   Procedure: IRRIGATION AND DEBRIDEMENT EXTREMITY;  Surgeon: Wylene Simmer, MD;  Location: [place];  Service: Orthopedics;  Laterality: Left;  I & D of  Left Foot  . I&D EXTREMITY  07/06/2011   Procedure: IRRIGATION AND DEBRIDEMENT EXTREMITY;  Surgeon: Wylene Simmer, MD;  Location: [facility];  Service: Orthopedics;  Laterality: Right;  IRRIGATION/DEBRIDEMENT RIGHT BELOW KNEE AMPUTATION    Assessment & Plan Clinical Impression: Patient is a 50 y.o.femalewith history of T2DM with neuropathy, ICH, diabetic foot ulcer s/p R-BKA 7/13, medication non-compliance for past year who was admitted on 08/23/15 with malaise, inability to walk, dyspnea and numbness of her hands. She was found to have  multiple skin lesions with metabolic acidosis, severe normocytic-normochromic anemia--hgb 4.8 and thrombocytopenia - 10. She was found to have acute renal failure and renal ultrasound showed normal kidneys with distended bladder and bilateral hydronephrosis. She was transfused with 2 units PRBC and one unit platelets and started on Levaquin for UTI. Dr [name] consulted for work up and recommended skin biopsy as well as autoimmune studies. Bone marrow biopsy negative and skin bx path pending. Dr. Justin Mend consulted for input and felt that assumed ARF in setting of bilateral hydronephrosis and BOO. Urine output improved with foley and SCr on downward trend. He felt lesions likely chronic diabetic lesions and recommendedGUconsult. Repeat ultrasound 8/27 showed moderate bilateral hydronephrosis. CT abdomen/pelvis done due to concerns of rectovaginal fistula. This showed diffuse anasarca, no evidence of fistula and moderately enlarged bilateral inguinal lymph nodes.   Dr.Herrick evaluated patient and felt that urinary retention has been chronic leading to ureteral reflux and hydronephrosis. He felt that hydronephrosis had improved with diuresis/foley and that ureteral stents not recommended. Hydronephrosis should improve over next few weeks with diuresis--to repeat voiding trial and to educate patient on in and out caths v/s indwelling foley. She continues to have impressive UOP with downward trend in Scr- 2.05 and weight to 101 kg. Skin Biopsy revealed epidermal hyperplasia with fibrosing granulation tissue. She continues to have persistent coagulopathy--PT/PTT mixing studies pending. Heme/Onc recommends erythropoietin/iron for anemia and questions presence of lupus anticoagulant. To transfuse as needed for platelets less than 10K  Patient transferred to CIR on 09/04/2015.    Pt presents with decreased activity tolerance, decreased functional mobility, decreased balance, decreased leisure awareness  Limiting pt's independence with leisure/community pursuits.  Leisure History/Participation Premorbid leisure interest/current participation: Petra Kuba - Programmer, multimedia gardening Other Leisure Interests: Cooking/Baking Leisure Participation Style: Alone;With Family/Friends Psychosocial / Spiritual Patient agreeable to Pet Therapy: No Does patient have pets?: No Social interaction - Mood/Behavior: Cooperative Academic librarian Appropriate for Education?: Yes Recreational Therapy Orientation Orientation -Reviewed with patient: Available activity resources Strengths/Weaknesses Patient Strengths/Abilities: Willingness to participate Patient weaknesses: Physical limitations TR Patient demonstrates impairments in the following area(s): Endurance;Motor;Safety;Skin Integrity  Plan Rec Therapy Plan Is patient appropriate for Therapeutic Recreation?: Yes Rehab Potential: Good Treatment times per week: Min 1 time per week >20 minutes Estimated Length of Stay: 2 weeks TR Treatment/Interventions: Adaptive equipment instruction;1:1 session;Balance/vestibular training;Functional mobility training;Community reintegration;Leisure education;Patient/family education;Therapeutic activities;Recreation/leisure participation;Therapeutic exercise;UE/LE Coordination activities;Wheelchair propulsion/positioning Recommendations for other services: Neuropsych  Recommendations for other services: Neuropsych  Discharge Criteria: Patient will be discharged from TR if patient refuses treatment 3 consecutive times without medical reason.  If treatment goals not met, if there is a change in medical status, if patient makes no progress towards goals or if patient is discharged from hospital.  The above assessment, treatment plan, treatment alternatives and goals were discussed and mutually agreed upon: by patient  [place] 09/10/2015, 3:47 PM

## 2015-09-10 NOTE — Progress Notes (Signed)
S: She continues to have no urinary urge requiring I/O catheterization for voiding. PT was partially limited by orthostatic hypotension.  O:BP (!) 95/55 (BP Location: Right Arm) Comment: after squat pivot transfer sitting in w/c  Pulse 82   Temp 99.6 F (37.6 C) (Oral)   Resp 17   Ht _0  (1.702 m)   Wt 201 lb 9.6 oz (91.4 kg)   LMP  (LMP Unknown)   SpO2 96%   BMI 31.58 kg/m   Intake/Output Summary (Last 24 hours) at 09/10/15 1205 Last data filed at 09/10/15 0856  Gross per 24 hour  Intake             1140 ml  Output             4300 ml  Net            -3160 ml   Intake/Output: I/O last 3 completed shifts: In: 1840 [P.O.:1840] Out: 5075 [Urine:5075]  Intake/Output this shift:  Total I/O In: 240 [P.O.:240] Out: 1000 [Urine:1000] Weight change:  Gen: Sitting upright in bed in no distress CVS: RRR, no murmur Resp: CTAB Ext: R BKA present, no left leg pedal edema Skin: dark lesions on extensor surfaces of all limbs, with some excoriations   Recent Labs Lab 09/04/15 0338 09/05/15 0823 09/08/15 1506 09/10/15 0533  NA 138 136 132* 135  K 4.8 3.9 3.8 3.7  CL 106 104 101 101  CO2 _1 20*  GLUCOSE 157* 141* 129* 94  BUN 22* 15 11 22*  CREATININE 2.05* 1.79* 1.64* 1.74*  ALBUMIN 2.8* 3.0* 3.3*  --   CALCIUM 8.8* 9.1 8.2* 7.8*  PHOS 3.4  --  3.1  --   AST  --  21  --   --   ALT  --  17  --   --    Liver Function Tests:  Recent Labs Lab 09/04/15 0338 09/05/15 0823 09/08/15 1506  AST  --  21  --   ALT  --  17  --   ALKPHOS  --  76  --   BILITOT  --  0.4  --   PROT  --  7.3  --   ALBUMIN 2.8* 3.0* 3.3*   CBC:  Recent Labs Lab 09/04/15 0336 09/05/15 0823 09/08/15 1506 09/10/15 0533  WBC 7.3 7.8 7.9 6.4  NEUTROABS  --  6.4  --  4.2  HGB 7.9* 8.9* 9.2* 9.1*  HCT 25.8* 29.2* 30.0* 28.9*  MCV 89.0 89.0 89.0 87.0  PLT 102* 148* 125* 78*    CBG:  Recent Labs Lab 09/09/15 1137 09/09/15 1628 09/09/15 2155 09/10/15 0632 09/10/15 1136  GLUCAP  108* 108* 114* 79 94    Studies/Results: No results found. . B-complex with vitamin C  1 tablet Oral Daily  . bethanechol  25 mg Oral TID  . calcium carbonate  2 tablet Oral TID  . carvedilol  12.5 mg Oral BID WC  . cephALEXin  250 mg Oral Q6H  . feeding supplement (PRO-STAT SUGAR FREE 64)  30 mL Oral BID  . ferric gluconate (FERRLECIT/NULECIT) IV  125 mg Intravenous QODAY  . insulin aspart  0-20 Units Subcutaneous TID WC  . insulin aspart  0-5 Units Subcutaneous QHS  . multivitamin with minerals  1 tablet Oral Daily  . prochlorperazine  5 mg Oral TID AC  . senna-docusate  2 tablet Oral BID    BMET    Component Value Date/Time   NA 135 09/10/2015  0533   K 3.7 09/10/2015 0533   CL 101 09/10/2015 0533   CO2 20 (L) 09/10/2015 0533   GLUCOSE 94 09/10/2015 0533   BUN 22 (H) 09/10/2015 0533   CREATININE 1.74 (H) 09/10/2015 0533   CALCIUM 7.8 (L) 09/10/2015 0533   GFRNONAA 34 (L) 09/10/2015 0533   GFRAA 39 (L) 09/10/2015 0533   CBC    Component Value Date/Time   WBC 6.4 09/10/2015 0533   RBC 3.32 (L) 09/10/2015 0533   HGB 9.1 (L) 09/10/2015 0533   HCT 28.9 (L) 09/10/2015 0533   HCT 20.8 (L) 08/26/2015 1436   PLT 78 (L) 09/10/2015 0533   MCV 87.0 09/10/2015 0533   MCH 27.4 09/10/2015 0533   MCHC 31.5 09/10/2015 0533   RDW 16.3 (H) 09/10/2015 0533   LYMPHSABS 1.6 09/10/2015 0533   MONOABS 0.3 09/10/2015 0533   EOSABS 0.3 09/10/2015 0533   BASOSABS 0.0 09/10/2015 0533     Assessment/Plan:  1. Acute renal failure: SCr improving slowly and it is unclear what level of chronic is present. The current level may be her new baseline since some CKD is likely given other diabetes complications. The plan for outpatient follow up with Dr. Hollie Salk.  2. Bilateral hydronephrosis: Likely neurogenic bladder from her DM as she also has peripheral neuropathy. Currently she is catheter dependent and I agree with outpatient urology follow up. She will need repeat renal US in a few weeks. She  has limited assistance at home with her mother and small children and does not seem able to perform adequate self catheterization so needs aggressive home health or possibly foley catheter at discharge.  3. Anemia/thrombocytopenia: Anemia out of proportion to chronic renal disease and bone marrow biopsy + peripheral smear unremarkable. FOBT positive and ideally she should undergo GI evaluation. She did receive 4 units pRBCs during this admission now s/p IV supplementation or iron. Aranesp given 8/29 will plan to repeat 2 wks given severe anemia.  4. Orthostatic hypotension: Probably exacerbated by autonomic dysfunction. She does not seem volume depleted. Would change her Coreg to a lower dose or alternate antihypertensive since this can be exacerbating.  5. Hypocalcemia: resolved with supplementation  6. Metabolic acidosis: resolved  7. PVD s/p right BKA with deconditioning - She still has no use or training with her prosthetic leg which may help her mobility   We will arrange follow up with Dr. Hollie Salk in clinic for continued treatment. Please call if you have any additional questions or need repeat evaluation.   Collier Salina, MD PGY-II Internal Medicine Resident Pager# [phone] 09/10/2015, 12:05 PM   I have seen and examined this patient and agree with plan and assessment in the above note with renal recommendations/intervention highlighted.  She is set up for outpatient follow up with Dr. Hollie Salk on 09/24/15 at 8:45 at [facility], [address] [place], [place] [address].  Phone number [phone].  Broadus John A Brynli Ollis,MD 09/10/2015 2:42 PM

## 2015-09-11 ENCOUNTER — Inpatient Hospital Stay (HOSPITAL_COMMUNITY): Payer: Medicaid Other | Admitting: Physical Therapy

## 2015-09-11 ENCOUNTER — Inpatient Hospital Stay (HOSPITAL_COMMUNITY): Payer: Medicaid Other | Admitting: Occupational Therapy

## 2015-09-11 DIAGNOSIS — M792 Neuralgia and neuritis, unspecified: Secondary | ICD-10-CM

## 2015-09-11 DIAGNOSIS — E1142 Type 2 diabetes mellitus with diabetic polyneuropathy: Secondary | ICD-10-CM

## 2015-09-11 LAB — BASIC METABOLIC PANEL
Anion gap: 8 (ref 5–15)
BUN: 27 mg/dL — ABNORMAL HIGH (ref 6–20)
CO2: 23 mmol/L (ref 22–32)
Calcium: 8.7 mg/dL — ABNORMAL LOW (ref 8.9–10.3)
Chloride: 110 mmol/L (ref 101–111)
Creatinine, Ser: 1.65 mg/dL — ABNORMAL HIGH (ref 0.44–1.00)
GFR calc Af Amer: 41 mL/min — ABNORMAL LOW (ref >60)
GFR calc non Af Amer: 36 mL/min — ABNORMAL LOW (ref >60)
Glucose, Bld: 100 mg/dL — ABNORMAL HIGH (ref 65–99)
Potassium: 4.1 mmol/L (ref 3.5–5.1)
Sodium: 141 mmol/L (ref 135–145)

## 2015-09-11 LAB — CBC WITH DIFFERENTIAL/PLATELET
Basophils Absolute: 0 10*3/uL (ref 0.0–0.1)
Basophils Relative: 1 %
Eosinophils Absolute: 0.2 10*3/uL (ref 0.0–0.7)
Eosinophils Relative: 4 %
HCT: 30 % — ABNORMAL LOW (ref 36.0–46.0)
Hemoglobin: 9.1 g/dL — ABNORMAL LOW (ref 12.0–15.0)
Lymphocytes Relative: 23 %
Lymphs Abs: 1.3 10*3/uL (ref 0.7–4.0)
MCH: 26.8 pg (ref 26.0–34.0)
MCHC: 30.3 g/dL (ref 30.0–36.0)
MCV: 88.5 fL (ref 78.0–100.0)
Monocytes Absolute: 0.5 10*3/uL (ref 0.1–1.0)
Monocytes Relative: 8 %
Neutro Abs: 3.8 10*3/uL (ref 1.7–7.7)
Neutrophils Relative %: 66 %
Platelets: 68 10*3/uL — ABNORMAL LOW (ref 150–400)
RBC: 3.39 MIL/uL — ABNORMAL LOW (ref 3.87–5.11)
RDW: 16.3 % — ABNORMAL HIGH (ref 11.5–15.5)
WBC: 5.8 10*3/uL (ref 4.0–10.5)

## 2015-09-11 LAB — GLUCOSE, CAPILLARY
Glucose-Capillary: 100 mg/dL — ABNORMAL HIGH (ref 65–99)
Glucose-Capillary: 146 mg/dL — ABNORMAL HIGH (ref 65–99)
Glucose-Capillary: 108 mg/dL — ABNORMAL HIGH (ref 65–99)

## 2015-09-11 MED ORDER — SODIUM CHLORIDE 0.9 % IV SOLN
INTRAVENOUS | Status: DC
  Administered 2015-09-11: 18:00:00 via INTRAVENOUS
  Filled 2015-09-11: qty 1000

## 2015-09-11 MED ORDER — GABAPENTIN 600 MG PO TABS
300.0000 mg | ORAL_TABLET | Freq: Three times a day (TID) | ORAL | Status: DC
  Administered 2015-09-11 – 2015-09-18 (×22): 300 mg via ORAL
  Filled 2015-09-11 (×22): qty 1

## 2015-09-11 MED ORDER — SENNOSIDES-DOCUSATE SODIUM 8.6-50 MG PO TABS
2.0000 | ORAL_TABLET | Freq: Every day | ORAL | Status: DC
  Administered 2015-09-12: 2 via ORAL
  Filled 2015-09-11 (×5): qty 2

## 2015-09-11 MED ORDER — SODIUM CHLORIDE 0.9 % IV SOLN: INTRAVENOUS | Status: DC

## 2015-09-11 NOTE — Progress Notes (Signed)
Social Work Patient ID: [name], female   DOB: 1965-04-10, 50 y.o.   MRN: [id number]  Met with pt to inform team conference goals-mod/i wheelchair level and supervision with ambulating, with target discharge on 9/13. Pt is pleased with the date and is feeling better. She was smiling when finding out date to go home. Will work on discharge needs and see if she qualifies for home health therapies. Will also contact her Mom to make her aware.

## 2015-09-11 NOTE — Progress Notes (Signed)
Patient noted with a volume of 1600 cc of urine emptied from foley this shift. Discussed with patient removal of foley patient expressed concern of being in and out cath frequently which cause extreme discomfort. Made Marissa NestlePam Love, PA aware. Also notified Allena KatzPatel, MD given verbal order to keep foley while patient is receiving IV fluid. Also to continue to encourage patient to increase p.o.fluids. Will continue to monitor patient. [place]

## 2015-09-11 NOTE — Plan of Care (Signed)
Problem: RH BLADDER ELIMINATION Goal: RH STG MANAGE BLADDER WITH EQUIPMENT WITH ASSISTANCE STG Manage Bladder With Equipment With total Assistance   Outcome: Progressing Foley catheter placed 9/6

## 2015-09-11 NOTE — Progress Notes (Signed)
Nutrition Follow-up  DOCUMENTATION CODES:   Severe malnutrition in context of acute illness/injury, Obesity unspecified  INTERVENTION:  Continue 30 ml Prostat po BID, each supplement provides 100 kcal and 15 grams of protein.   Encourage adequate PO intake.   NUTRITION DIAGNOSIS:   Malnutrition related to acute illness (dyspnea, acute renal failure, debility) as evidenced by energy intake < or equal to 50% for > or equal to 5 days, percent weight loss; ongoing  GOAL:   Patient will meet greater than or equal to 90% of their needs; met  MONITOR:   PO intake, Supplement acceptance, Labs, Skin  REASON FOR ASSESSMENT:   Malnutrition Screening Tool, Low Braden    ASSESSMENT:   50 y.o.femalewith history of T2DM with neuropathy, ICH, diabetic foot ulcer s/p R-BKA 7/13, medication non-compliance for past year who was admitted on 08/23/15 with malaise, inability to walk, dyspnea and numbness of her hands. She was found to have multiple skin lesions with metabolic acidosis, severe normocytic-normochromic anemia--hgb 4.8 and thrombocytopenia - 10. She was found to have acute renal failure and renal ultrasound showed normal kidneys with distended bladder and bilateral hydronephrosis. Now in rehabilitation for debility.  Meal completion has been 75-100%. Appetite and intake at meals have improved. Pt currently has Prostat ordered and has been consuming them. RD to continue with current orders. Pt was educated on the importance of adequate nutrition intake and to continue nutritional supplementation at home especially if po intake is poor.   Labs and medications reviewed.   Diet Order:  Diet Carb Modified Fluid consistency: Thin; Room service appropriate? Yes  Skin:  Wound (see comment) (Stg 2 on L breast, unstg on L buttocks)  Last BM:  9/5  Height:   Ht Readings from Last 1 Encounters:  09/04/15 _0  (1.702 m)    Weight:   Wt Readings from Last 1 Encounters:  09/09/15 201  lb 9.6 oz (91.4 kg)    Ideal Body Weight:  61.36 kg  BMI:  Body mass index is 31.58 kg/m.  Estimated Nutritional Needs:   Kcal:  2000-2200  Protein:  110-120 grams  Fluid:  >/= 2 L/day  EDUCATION NEEDS:   Education needs addressed  Corrin Parker, MS, RD, LDN Pager # [phone] After hours/ weekend pager # [phone]

## 2015-09-11 NOTE — Progress Notes (Signed)
[facility] PHYSICAL MEDICINE & REHABILITATION     PROGRESS NOTE  Subjective/Complaints:  Pt laying in bed this AM.  She complains of neuropathic pain.   ROS:  +Neuropathy. Denies CP, SOB, N/V/D.  Objective: Vital Signs: Blood pressure 132/72, pulse 87, temperature 98.8 F (37.1 C), temperature source Oral, resp. rate 19, height 5\' 7"  (1.702 m), weight 91.4 kg (201 lb 9.6 oz), SpO2 98 %. No results found.  Recent Labs  09/10/15 0533 09/11/15 0508  WBC 6.4 5.8  HGB 9.1* 9.1*  HCT 28.9* 30.0*  PLT 78* 68*    Recent Labs  09/10/15 0533 09/11/15 0508  NA 135 141  K 3.7 4.1  CL 101 110  GLUCOSE 94 100*  BUN 22* 27*  CREATININE 1.74* 1.65*  CALCIUM 7.8* 8.7*   CBG (last 3)   Recent Labs  09/10/15 1649 09/10/15 2104 09/11/15 0647  GLUCAP 123* 105* 100*    Wt Readings from Last 3 Encounters:  09/09/15 91.4 kg (201 lb 9.6 oz)  09/03/15 101 kg (222 lb 10.6 oz)  12/14/12 90.7 kg (200 lb)    Physical Exam:  BP 132/72   Pulse 87   Temp 98.8 F (37.1 C) (Oral)   Resp 19   Ht 5\' 7"  (1.702 m)   Wt 91.4 kg (201 lb 9.6 oz)   LMP  (LMP Unknown)   SpO2 98%   BMI 31.58 kg/m  Constitutional: She appears well-developed and well-nourished. Obese  HENT: Normocephalic and atraumatic.  Eyes: Conjunctivae and EOM are normal.  Cardiovascular: Normal rate and regular rhythm.  No murmur heard. Respiratory: Effort normal and breath sounds normal.  GI: Soft. Bowel sounds are normal. She exhibits no distension. There is no tenderness.  Musculoskeletal: She exhibits edema. No tenderness. R-BKA well healed. Neurological: She is alert and oriented.  Speech clear and follows commands without difficulty.   Sensory deficits LLE, right residual limb and bilateral hands.  Motor: B/l UE: 4/5 proximal to distal LLE: 4+/5 hip flexion, knee extension, 4+/5 ankle dorsi/plantar flexion RLE: 4+/5 hip flexion, knee extension  Skin: Skin is warm and dry. Numerous skin lesions. Right  inguinal groin area with small amount of drainage and induration (improving) Psychiatric: She has flat mood and affect. Her behavior is normal. Thought content normal.   Assessment/Plan: 1. Functional deficits secondary to debility which require 3+ hours per day of interdisciplinary therapy in a comprehensive inpatient rehab setting. Physiatrist is providing close team supervision and 24 hour management of active medical problems listed below. Physiatrist and rehab team continue to assess barriers to discharge/monitor patient progress toward functional and medical goals.  Function:  Bathing Bathing position   Position: Wheelchair/chair at sink  Bathing parts Body parts bathed by patient: Right arm, Left arm, Chest, Abdomen, Front perineal area, Right upper leg, Left upper leg, Right lower leg, Left lower leg Body parts bathed by helper: Buttocks  Bathing assist Assist Level: Touching or steadying assistance(Pt > 75%)      Upper Body Dressing/Undressing Upper body dressing   What is the patient wearing?: Pull over shirt/dress     Pull over shirt/dress - Perfomed by patient: Thread/unthread right sleeve, Thread/unthread left sleeve, Put head through opening, Pull shirt over trunk          Upper body assist Assist Level: More than reasonable time, Set up   Set up : To obtain clothing/put away  Lower Body Dressing/Undressing Lower body dressing   What is the patient wearing?: Non-skid slipper socks, Pants  Pants- Performed by patient: Thread/unthread right pants leg, Thread/unthread left pants leg Pants- Performed by helper: Pull pants up/down Non-skid slipper socks- Performed by patient: Don/doff left sock Non-skid slipper socks- Performed by helper: Don/doff right sock               TED Hose - Performed by helper: Don/doff right TED hose  Lower body assist Assist for lower body dressing: Touching or steadying assistance (Pt > 75%)      Toileting Toileting  Toileting activity did not occur: No continent bowel/bladder event   Toileting steps completed by helper: Adjust clothing prior to toileting, Performs perineal hygiene, Adjust clothing after toileting (per Melton AlarHawa Khelleh, NT )    Toileting assist     Transfers Chair/bed transfer   Chair/bed transfer method: Stand pivot Chair/bed transfer assist level: Supervision or verbal cues Chair/bed transfer assistive device: Armrests     Locomotion Ambulation Ambulation activity did not occur: Safety/medical concerns   Max distance: 8417ft Assist level: Touching or steadying assistance (Pt > 75%)   Wheelchair   Type: Manual Max wheelchair distance: 12250ft Assist Level: Supervision or verbal cues  Cognition Comprehension Comprehension assist level: Understands basic 90% of the time/cues < 10% of the time  Expression Expression assist level: Expresses basic 90% of the time/requires cueing < 10% of the time.  Social Interaction Social Interaction assist level: Interacts appropriately 90% of the time - Needs monitoring or encouragement for participation or interaction.  Problem Solving Problem solving assist level: Solves complex 90% of the time/cues < 10% of the time  Memory Memory assist level: Recognizes or recalls 90% of the time/requires cueing < 10% of the time      Medical Problem List and Plan: 1. Deconditioning secondary to multiple medical issues including severe anemia, acute renal failure  continue CIR 2. DVT Prophylaxis/Anticoagulation: Mechanical: Sequential compression devices, below knee Bilateral lower extremities 3. Pain Management: Norco prn for chronic back pain  Gabapentin 300 TID started on 9/7 4. Mood: Team to provide ego support. LCSW to follow for evaluation and support.  5. Neuropsych: This patient iscapable of making decisions on herown behalf. 6. Skin/Wound Care: Aquacel Silver for MASD gluteal cleft.  7. Fluids/Electrolytes/Nutrition:   Scheduled compazine  before meals for ongoing nausea, ?gastroparesis.   -added megace for appetite 400mg  daily 10 T2DM with neuropathy, nephropathy, vasculopathy and ?gastroparesis:   Monitor BS ac/hs.   SSI for elevated BS.   Relatively stable 11. CKD: Avoid NSAIDs and other nephrotoxic drugs.   Hypocalcemia managed with tums. Monitor weights daily.   D/ced IVF 9/4  Encourage fluids, will start IVF qhs x3 12. Bilateral hydronephrosis due to chronic urinary retention/neurogenic bladder:   D/ced foley 9/4  Failed voiding trial  Urocholine 10mg  TID started on 9/5, increased to 25 on 9/6  Pt will likely need Urology follow up as outpt  Repeat U/S in a few weeks 13. ABLA on Anemia of chronic disease: On IV iron for supplement--received dose of Aransep on 8/29.  Hb up to 9.1 on 9/7   Heme Occult +  GI consulted, plan to discuss plan for scoping in 3-5 days inpt vs. outpt 14. Thrombocytopenia: Monitor with serial checks  Plts up to 68 on 9/7  Meds reviewed, will cont to monitor 15. Constipation  Bowel reg increased on 9/1 16. HTN: Improving  Coreg 6.25  Cont to monitor 17. Folliculitis  Right inguinal area  Cont Keflex 9/4 x7 days.  LOS (Days) 7 A FACE TO FACE EVALUATION  WAS PERFORMED  [name] 09/11/2015 9:02 AM

## 2015-09-11 NOTE — Progress Notes (Signed)
Physical Therapy Session Note  Patient Details  Name: [name] MRN: [id number] Date of Birth: 07/11/1965   Today's Date: 09/11/2015 PT Individual Time: 1510-1550 AND [phone] PT Individual Time Calculation (min): 40 min AND 58 min     Short Term Goals: Week 1:  PT Short Term Goal 1 (Week 1): Pt will ambulate 10 ft with RW & mod A. PT Short Term Goal 2 (Week 1): Pt will initiate stair training. PT Short Term Goal 3 (Week 1): Pt will propel w/c 150 ft with supervision.  Week 2:     Skilled Therapeutic Interventions/Progress Updates:     Patient received sitting in WC with trade off from OT. PT instructed patient in [facility] mobility with supervision A for 150 ft and min cues for doorway management and appreciation for obstacles in lower quadrant visual field.   PT instructed patient in Gait training for 55f and 149fwith RW and min A from PT. Mod-max cues for proper use of BUE and AD positioning  PT instructed patient in car transfer with supervision A and RW. Mod cues for AD management and proper use of BUE to improve foot clearance.   Nustep endurance training x 12 minutes with supervision A from PT. Level 2>5 with Borg 13/20 at end of nustep endurance training.   Throughout treatment, patient did not wear abdominal binder and reports no symptoms of Orthostasis.   Patient returned to room and [facility] needs met and call bell in reach    Session 2.   Patient received with trade off from NT. Patient performed sit<>stand with supervision Assist with RW of Orthostatics vitals assessed by NT; see flow sheet.   Gait training in room for 15 ft to bathroom for bowel movement. PT provided min A and mod cues for AD management across threshold into bathroom.   Toilet transfer with min A from with PT. Patient noted to have loose stool prior to transfer to toilet, requiring PT to performed clothing management.   Following bowel movement on toilet PT instructed patient  in sit<>stand for perineal hygiene as stand pivot chair.   [facility] reach and all needs met.     Therapy Documentation Precautions:  Precautions Precautions: Fall Precaution Comments: R BKA 2013 Restrictions Weight Bearing Restrictions: No General:   Vital Signs: Therapy Vitals Temp: 98.1 F (36.7 C) Temp Source: Oral Pulse Rate: 87 Resp: 18 BP: (!) 148/68 Patient Position (if appropriate): Sitting  See Function Navigator for Current Functional Status.   Therapy/Group: Individual Therapy  AuLorie Phenix/07/2015, 5:43 PM

## 2015-09-11 NOTE — Progress Notes (Addendum)
Occupational Therapy Session Note  Patient Details  Name: [name] MRN: [id number] Date of Birth: [birth year]  Today's Date: 09/11/2015  Session 1 OT Individual Time: 3474-2595 OT Individual Time Calculation (min): 60 min   Session 2 OT Individual Time: 1415-1500 OT Individual Time Calculation (min): 45 min   Short Term Goals: Week 1:  OT Short Term Goal 1 (Week 1): Pt will transfer to toilet using LRD with min A OT Short Term Goal 2 (Week 1): Pt will complete LB dressing with Min A OT Short Term Goal 3 (Week 1): Pt will complete bathing with Min A OT Short Term Goal 4 (Week 1): Pt will maintain standing balance for 3 minutes with Min A and RW while reaching within base of support.  Skilled Therapeutic Interventions/Progress Updates:    Session1 Pt seen for skilled OT session with focus on bathing/dressing modifications, sit<>stands, toilet transfers, and standing balance. TED hose and abdominal binder donned prior to bed mobility.  No orthostatic symptoms during ADLs or transfers. Pt completed modified squat-pivot transfers with close supervision, performed bathing at the sink, then required min steady assist to maintain standing balance to wash peri-area.  Stand-pivot w/ RW and min steady A to transfer to [facility], pt with very small BM , reqiuring assist for toileting 2/2 fatigue in standing. Pt left seated in w/c at end of session with needs met and PT present.   Session 2 Pt seen for second OT session with focus on stand-step-turn transfers, standing endurance, general strengthening, and fine motor skills. Pt able to complete stand-step-turn transfers W/ RW and close supervision. Pt propelled w/c to therapy gym where she participated in tricp extension, bicep curls, chest pull, punches 3x10 repsusing thera-band, dynamic standing activities with unilateral UE support- close supervision for balance, and fine motor activities working on translation and grip strength. Pt returned to room  at end of session and left seated in w/c with needs met. Therapy Documentation Precautions:  Precautions Precautions: Fall Precaution Comments: R BKA 2013 Restrictions Weight Bearing Restrictions: No General:   Vital Signs: Therapy Vitals Pulse Rate: 87 BP: 132/72 Pain: Pain Assessment Pain Assessment: No/denies pain ADL: ADL ADL Comments: see functional navigator  See Function Navigator for Current Functional Status.   Therapy/Group: Individual Therapy  Valma Cava 09/11/2015, 9:38 AM

## 2015-09-12 ENCOUNTER — Inpatient Hospital Stay (HOSPITAL_COMMUNITY): Payer: Medicaid Other | Admitting: Occupational Therapy

## 2015-09-12 ENCOUNTER — Inpatient Hospital Stay (HOSPITAL_COMMUNITY): Payer: Medicaid Other | Admitting: Physical Therapy

## 2015-09-12 LAB — CBC WITH DIFFERENTIAL/PLATELET
Basophils Absolute: 0 10*3/uL (ref 0.0–0.1)
Basophils Relative: 1 %
Eosinophils Absolute: 0.2 10*3/uL (ref 0.0–0.7)
Eosinophils Relative: 4 %
HCT: 28 % — ABNORMAL LOW (ref 36.0–46.0)
Hemoglobin: 8.7 g/dL — ABNORMAL LOW (ref 12.0–15.0)
Lymphocytes Relative: 24 %
Lymphs Abs: 1.3 10*3/uL (ref 0.7–4.0)
MCH: 27.4 pg (ref 26.0–34.0)
MCHC: 31.1 g/dL (ref 30.0–36.0)
MCV: 88.1 fL (ref 78.0–100.0)
Monocytes Absolute: 0.4 10*3/uL (ref 0.1–1.0)
Monocytes Relative: 7 %
Neutro Abs: 3.5 10*3/uL (ref 1.7–7.7)
Neutrophils Relative %: 65 %
Platelets: 54 10*3/uL — ABNORMAL LOW (ref 150–400)
RBC: 3.18 MIL/uL — ABNORMAL LOW (ref 3.87–5.11)
RDW: 16.5 % — ABNORMAL HIGH (ref 11.5–15.5)
WBC: 5.4 10*3/uL (ref 4.0–10.5)

## 2015-09-12 LAB — GLUCOSE, CAPILLARY
Glucose-Capillary: 152 mg/dL — ABNORMAL HIGH (ref 65–99)
Glucose-Capillary: 134 mg/dL — ABNORMAL HIGH (ref 65–99)
Glucose-Capillary: 93 mg/dL (ref 65–99)
Glucose-Capillary: 109 mg/dL — ABNORMAL HIGH (ref 65–99)
Glucose-Capillary: 205 mg/dL — ABNORMAL HIGH (ref 65–99)

## 2015-09-12 MED ORDER — SODIUM CHLORIDE 0.9 % IV SOLN
INTRAVENOUS | Status: AC
  Administered 2015-09-12 – 2015-09-13 (×2): via INTRAVENOUS

## 2015-09-12 NOTE — Progress Notes (Signed)
[place] PHYSICAL MEDICINE & REHABILITATION     PROGRESS NOTE  Subjective/Complaints:  Pt slept well overnight.  She has questions about her foley and her bladder prognosis.  Her neuropathy has improved.   ROS:  Denies CP, SOB, N/V/D.  Objective: Vital Signs: Blood pressure (!) 104/50, pulse 87, temperature 98.8 F (37.1 C), temperature source Oral, resp. rate 18, height 5\' 7"  (1.702 m), weight 90.9 kg (200 lb 6.4 oz), SpO2 98 %. No results found.  Recent Labs  09/11/15 0508 09/12/15 0630  WBC 5.8 5.4  HGB 9.1* 8.7*  HCT 30.0* 28.0*  PLT 68* PENDING    Recent Labs  09/10/15 0533 09/11/15 0508  NA 135 141  K 3.7 4.1  CL 101 110  GLUCOSE 94 100*  BUN 22* 27*  CREATININE 1.74* 1.65*  CALCIUM 7.8* 8.7*   CBG (last 3)   Recent Labs  09/11/15 1641 09/11/15 2017 09/12/15 0637  GLUCAP 108* 152* 134*    Wt Readings from Last 3 Encounters:  09/12/15 90.9 kg (200 lb 6.4 oz)  09/03/15 101 kg (222 lb 10.6 oz)  12/14/12 90.7 kg (200 lb)    Physical Exam:  BP (!) 104/50   Pulse 87   Temp 98.8 F (37.1 C) (Oral)   Resp 18   Ht 5\' 7"  (1.702 m)   Wt 90.9 kg (200 lb 6.4 oz)   LMP  (LMP Unknown)   SpO2 98%   BMI 31.39 kg/m  Constitutional: She appears well-developed and well-nourished. Obese  HENT: Normocephalic and atraumatic.  Eyes: Conjunctivae and EOM are normal.  Cardiovascular: Normal rate and regular rhythm.  No murmur heard. Respiratory: Effort normal and breath sounds normal.  GI: Soft. Bowel sounds are normal. She exhibits no distension. There is no tenderness.  Musculoskeletal: She exhibits edema. No tenderness. R-BKA well healed. Neurological: She is alert and oriented.  Speech clear and follows commands without difficulty.   Sensory deficits LLE, right residual limb and bilateral hands.  Motor: B/l UE: 4/5 proximal to distal LLE: 4+/5 hip flexion, knee extension, 5/5 ankle dorsi/plantar flexion RLE: 5/5 hip flexion, knee extension  Skin: Skin is  warm and dry. Numerous skin lesions. Right inguinal groin area closed. Psychiatric: She has flat mood and affect. Her behavior is normal. Thought content normal.   Assessment/Plan: 1. Functional deficits secondary to debility which require 3+ hours per day of interdisciplinary therapy in a comprehensive inpatient rehab setting. Physiatrist is providing close team supervision and 24 hour management of active medical problems listed below. Physiatrist and rehab team continue to assess barriers to discharge/monitor patient progress toward functional and medical goals.  Function:  Bathing Bathing position   Position: Wheelchair/chair at sink  Bathing parts Body parts bathed by patient: Right arm, Left arm, Chest, Front perineal area, Abdomen, Left lower leg, Left upper leg, Right upper leg Body parts bathed by helper: Buttocks  Bathing assist Assist Level: Touching or steadying assistance(Pt > 75%)      Upper Body Dressing/Undressing Upper body dressing   What is the patient wearing?: Pull over shirt/dress     Pull over shirt/dress - Perfomed by patient: Thread/unthread right sleeve, Thread/unthread left sleeve, Put head through opening, Pull shirt over trunk          Upper body assist Assist Level: Set up   Set up : To obtain clothing/put away  Lower Body Dressing/Undressing Lower body dressing   What is the patient wearing?: Non-skid slipper socks, Ted Hose, Pants  Pants- Performed by patient: Thread/unthread right pants leg, Thread/unthread left pants leg Pants- Performed by helper: Pull pants up/down Non-skid slipper socks- Performed by patient: Don/doff left sock Non-skid slipper socks- Performed by helper: Don/doff right sock               TED Hose - Performed by helper: Don/doff left TED hose  Lower body assist Assist for lower body dressing: Touching or steadying assistance (Pt > 75%)      Toileting Toileting Toileting activity did not occur: No continent  bowel/bladder event Toileting steps completed by patient: Adjust clothing prior to toileting Toileting steps completed by helper: Adjust clothing after toileting, Performs perineal hygiene    Toileting assist Assist level: Touching or steadying assistance (Pt.75%)   Transfers Chair/bed transfer   Chair/bed transfer method: Stand pivot Chair/bed transfer assist level: Supervision or verbal cues Chair/bed transfer assistive device: Armrests, Patent attorney Ambulation activity did not occur: Safety/medical concerns   Max distance: 22 ft  Assist level: Touching or steadying assistance (Pt > 75%)   Wheelchair   Type: Manual Max wheelchair distance: 115ft  Assist Level: Supervision or verbal cues  Cognition Comprehension Comprehension assist level: Understands basic 90% of the time/cues < 10% of the time  Expression Expression assist level: Expresses basic 90% of the time/requires cueing < 10% of the time.  Social Interaction Social Interaction assist level: Interacts appropriately 90% of the time - Needs monitoring or encouragement for participation or interaction.  Problem Solving Problem solving assist level: Solves basic 90% of the time/requires cueing < 10% of the time  Memory Memory assist level: Recognizes or recalls 90% of the time/requires cueing < 10% of the time      Medical Problem List and Plan: 1. Deconditioning secondary to multiple medical issues including severe anemia, acute renal failure  continue CIR 2. DVT Prophylaxis/Anticoagulation: Mechanical: Sequential compression devices, below knee Bilateral lower extremities 3. Pain Management: Norco prn for chronic back pain  Gabapentin 300 TID started on 9/7 4. Mood: Team to provide ego support. LCSW to follow for evaluation and support.  5. Neuropsych: This patient iscapable of making decisions on herown behalf. 6. Skin/Wound Care: Aquacel Silver for MASD gluteal cleft.  7.  Fluids/Electrolytes/Nutrition:   Scheduled compazine before meals for ongoing nausea, ?gastroparesis.   -added megace for appetite 400mg  daily 10 T2DM with neuropathy, nephropathy, vasculopathy and ?gastroparesis:   Monitor BS ac/hs.   SSI for elevated BS.   Relatively stable 11. CKD: Avoid NSAIDs and other nephrotoxic drugs.   Hypocalcemia managed with tums.    Encourage fluids, started IVF again qhs x3 (2 more)  Cr. 1.65 on 9/7 Filed Weights   09/08/15 0605 09/09/15 0604 09/12/15 0500  Weight: 90.7 kg (199 lb 14.4 oz) 91.4 kg (201 lb 9.6 oz) 90.9 kg (200 lb 6.4 oz)  12. Bilateral hydronephrosis due to chronic urinary retention/neurogenic bladder:   Failed voiding trial, will retain foley until d/c IVF  Urocholine 10mg  TID started on 9/5, increased to 25 on 9/6  Pt will likely need Urology follow up as outpt  Repeat U/S in a few weeks 13. ABLA on Anemia of chronic disease: On IV iron for supplement--received dose of Aransep on 8/29.  Hb 8.7 on 9/8  Heme Occult +  GI consulted, plan to discuss plan for scoping in 3-5 days inpt vs. outpt 14. Thrombocytopenia: Monitor with serial checks  Plts pending for today  Meds reviewed, will cont to monitor 15. Constipation  Bowel reg increased on 9/1 16. HTN: Improving  Coreg 6.25  Cont to monitor 17. Folliculitis: Improving  Right inguinal area  Cont Keflex 9/4 x7 days.  LOS (Days) 8 A FACE TO FACE EVALUATION WAS PERFORMED  Airiel Oblinger Karis Juba 09/12/2015 8:38 AM

## 2015-09-12 NOTE — Progress Notes (Signed)
Occupational Therapy Weekly Progress Note  Patient Details  Name: [name] MRN: [id number] Date of Birth: 01/03/1966  Beginning of progress report period: September 05, 2015 End of progress report period: September 12, 2015  Today's Date: 09/12/2015   Session 1 OT Individual Time: 1005-1100 OT Individual Time Calculation (min): 55 min   Session 2 OT Individual Time: 1300-1330 OT Individual Time Calculation (min): 30 min   Session 3 OT Individual Time: 1445-1530 OT Individual Time Calculation (min): 45 min    Patient has met 4 of 4 short term goals.  Pt is able to complete modified squat-pivot transfers with close supervision, she is completing her bathing and dressing at wheelchair level with 50% supervision/set-up A, close supervision to stand w/ Rw,  and intermittent min guard A for standing balance when performing toileting or managing clothing. She is making great progress towards her long-term goals.   Patient continues to demonstrate the following deficits: muscle weakness, decreased visual acuity, decreased problem solving and decreased standing balance and decreased balance strategies  and therefore will continue to benefit from skilled OT intervention to enhance overall performance with BADL and iADL.  Patient progressing toward long term goals..  Continue plan of care.  OT Short Term Goals Week 2:  OT Short Term Goal 1 (Week 2): Continue working on long term goals at Mod I at wheelchair level   Skilled Therapeutic Interventions/Progress Updates:    Session 1 Pt seen for skilled OT treatment session with focus on modified bathing/dressing, functional transfers, and standing balance. Pt able to perform squat-pivot transfer from bed<>w/c with close supervision, and propel wheelchair to the sink for bathing/dressing. Pt is likely going to d/c with a catheter, so OT discussed how to thread catheter bag for LB dressing. Pt required close supervision with intermittent  contact guard A for 2 near LOB during clothing management and while washing buttocks. Pt left seated in w/c at end of session with needs met.    Session 2 Pt seen for skilled OT with focus on tub/shower transfers and home management. Per pt report, she has a tub/shower upstairs, and would like to get upstairs at home to shower. Pt brought to tub room and OT discussed tub/shower bench options and pt completed tub/shower transfer vis stand-pivot w/ Rw, pt reqiored close supervision and Mod instructional cues for sequencing. Pt returned to room at end of session and left with needs met.   Session 3 1:1 OT session with focus on simple meal prep, home management, problem solving, and safety awareness. Pt able to navigate apartment kitchen at wheelchair level to make can of soup w/ microwave. OT discussed modified techniques and kitchen safety awareness. Pt able to complete simple meal prep with min questioning cues for sequencing and problem solving safe techniques. Pt required Min A to open can 2/2 grip strength, but was able to open pull top soup can with increased time and effort. Pt returned to room at end of session with needs met.   Therapy Documentation Precautions:  Precautions Precautions: Fall Precaution Comments: R BKA 2013 Restrictions Weight Bearing Restrictions: No  Pain Assessment Pain Assessment: No/denies pain ADL: ADL ADL Comments: see functional navigator  See Function Navigator for Current Functional Status.   Therapy/Group: Individual Therapy  [name] 09/12/2015, 4:36 PM  

## 2015-09-12 NOTE — Progress Notes (Signed)
Physical Therapy Session Note  Patient Details  Name: [name] MRN: [id number] Date of Birth: 04/14/1965  Today's Date: 09/12/2015 PT Individual Time: 1332-1430 PT Individual Time Calculation (min): 58 min    Short Term Goals: Week 1:  PT Short Term Goal 1 (Week 1): Pt will ambulate 10 ft with RW & mod A. PT Short Term Goal 2 (Week 1): Pt will initiate stair training. PT Short Term Goal 3 (Week 1): Pt will propel w/c 150 ft with supervision.   Skilled Therapeutic Interventions/Progress Updates:     [facility] with trade off from OT.   Patient performed WC mobility without cues or assistance from PT for 13160ft but noted to require increased time with doorway management. WC mobility also instructed on carpeted surface for simulated home environment x 17220ft.   PT instructed patient in 1 stair negotiation to sit on chair at top of step to simulate entrance to house. PT provided supervision for gait of 5 ft to and from step and supervision A pivot to sitting in chair.   PT instructed patient in gait training with RW for 6045ft and 50 ft with supervision Assist and mod cues for proper swing-to gait pattern and increased use of UE. Patient demonstrated decreased use of BUE with increased distance.   [facility] and [facility] reach.     Therapy Documentation Precautions:  Precautions Precautions: Fall Precaution Comments: R BKA 2013 Restrictions Weight Bearing Restrictions: No General:   Vital Signs: Therapy Vitals Pulse Rate: 88 BP: 108/62   See Function Navigator for Current Functional Status.   Therapy/Group: Individual Therapy  Golden Popustin E Trey Gulbranson 09/12/2015, 6:16 PM

## 2015-09-13 ENCOUNTER — Inpatient Hospital Stay (HOSPITAL_COMMUNITY): Payer: Medicaid Other | Admitting: Physical Therapy

## 2015-09-13 DIAGNOSIS — Z89511 Acquired absence of right leg below knee: Secondary | ICD-10-CM

## 2015-09-13 DIAGNOSIS — E1149 Type 2 diabetes mellitus with other diabetic neurological complication: Secondary | ICD-10-CM

## 2015-09-13 DIAGNOSIS — E1129 Type 2 diabetes mellitus with other diabetic kidney complication: Secondary | ICD-10-CM

## 2015-09-13 DIAGNOSIS — N058 Unspecified nephritic syndrome with other morphologic changes: Secondary | ICD-10-CM

## 2015-09-13 LAB — GLUCOSE, CAPILLARY
Glucose-Capillary: 166 mg/dL — ABNORMAL HIGH (ref 65–99)
Glucose-Capillary: 113 mg/dL — ABNORMAL HIGH (ref 65–99)
Glucose-Capillary: 152 mg/dL — ABNORMAL HIGH (ref 65–99)

## 2015-09-13 NOTE — Progress Notes (Signed)
Physical Therapy Weekly Progress Note  Patient Details  Name: [name] MRN: [id number] Date of Birth: 09-Feb-1965  Beginning of progress report period: September 05, 2015 End of progress report period: September 13, 2015  Today's Date: 09/13/2015 PT Individual Time: 1615-1700 PT Individual Time Calculation (min): 45 min    Patient has met 3 of 3 short term goals.    Patient continues to demonstrate the following deficits: Endurance, balance, strength, gait, sensory and therefore will continue to benefit from skilled PT intervention to enhance overall performance with activity tolerance, balance, postural control, ability to compensate for deficits and awareness.  Patient progressing toward long term goals..  Continue plan of care.  PT Short Term Goals Week 1:  PT Short Term Goal 1 (Week 1): Pt will ambulate 10 ft with RW & mod A. PT Short Term Goal 1 - Progress (Week 1): Met PT Short Term Goal 2 (Week 1): Pt will initiate stair training. PT Short Term Goal 2 - Progress (Week 1): Met PT Short Term Goal 3 (Week 1): Pt will propel w/c 150 ft with supervision.  PT Short Term Goal 3 - Progress (Week 1): Met Week 2:  PT Short Term Goal 1 (Week 2): LTG=STG due to estimated DC date.    Skilled Therapeutic Interventions/Progress Updates:     [facility] and agreeable to PT.   PT instructed patient in gait training with RW with supervision A, 2x 73f with mod cues for proper use of BUE for swing-to gait pattern. PT also instructed patient in gait over uneven surface for 123fwith mod A from PT and max cues for proper gait pattern as listed above.   WC mobility training for 16066f 2 with supervision A with min cues for doorway management.   Patient performed sit<>stand with RW x 10 throughout treatment with supervision A for safety.   PT provided family education for patient's mother, present at end of treatment for safe sit<>stand trasfer, stand pivot transfer to  furniture, and stand pivot transfer to car. Min-mod cues to mother of patient for proper positioning and cues to patient for safe UE placement. Pt's mother declined hands on education, but verbilazed understanding for safe guarding technique. Education also provided to mother of patient for safe ascent of one step into house with posterior approahc to sit in seat placed at threshold. Patient's mother again verbalized understanding for safe guarding technique, but declined hands on application.   Patient returned to room and left sitting [facility].   Therapy Documentation Precautions:  Precautions Precautions: Fall Precaution Comments: R BKA 2013 Restrictions Weight Bearing Restrictions: No General:   Vital Signs: Therapy Vitals Pulse Rate: 88 BP: 135/69 Pain: 0/10     See Function Navigator for Current Functional Status.  Therapy/Group: Individual Therapy  AusLorie Phenix9/2017, 5:56 PM

## 2015-09-13 NOTE — Progress Notes (Addendum)
[place] PHYSICAL MEDICINE & REHABILITATION     PROGRESS NOTE  Subjective/Complaints:  Pt continues to state that she is sleeping well overnight.  She has questions about why she is having difficulty urinating.   ROS:  Denies CP, SOB, N/V/D.  Objective: Vital Signs: Blood pressure 134/70, pulse 82, temperature 98 F (36.7 C), temperature source Oral, resp. rate 16, height 5\' 7"  (1.702 m), weight 90.9 kg (200 lb 6.4 oz), SpO2 98 %. No results found.  Recent Labs  09/11/15 0508 09/12/15 0630  WBC 5.8 5.4  HGB 9.1* 8.7*  HCT 30.0* 28.0*  PLT 68* 54*    Recent Labs  09/11/15 0508  NA 141  K 4.1  CL 110  GLUCOSE 100*  BUN 27*  CREATININE 1.65*  CALCIUM 8.7*   CBG (last 3)   Recent Labs  09/12/15 2115 09/13/15 0700 09/13/15 1134  GLUCAP 205* 166* 113*    Wt Readings from Last 3 Encounters:  09/13/15 90.9 kg (200 lb 6.4 oz)  09/03/15 101 kg (222 lb 10.6 oz)  12/14/12 90.7 kg (200 lb)    Physical Exam:  BP 134/70   Pulse 82   Temp 98 F (36.7 C) (Oral)   Resp 16   Ht 5\' 7"  (1.702 m)   Wt 90.9 kg (200 lb 6.4 oz)   LMP  (LMP Unknown)   SpO2 98%   BMI 31.39 kg/m  Constitutional: She appears well-developed and well-nourished. Obese  HENT: Normocephalic and atraumatic.  Eyes: Conjunctivae and EOM are normal.  Cardiovascular: Normal rate and regular rhythm.  No murmur heard. Respiratory: Effort normal and breath sounds normal.  GI: Soft. Bowel sounds are normal. She exhibits no distension. There is no tenderness.  Musculoskeletal: She exhibits edema. No tenderness. R-BKA well healed. Neurological: She is alert and oriented.  Speech clear and follows commands without difficulty.   Sensory deficits LLE, right residual limb and bilateral hands.  Motor: B/l UE: 4/5 proximal to distal LLE: 4+/5 hip flexion, knee extension, 5/5 ankle dorsi/plantar flexion RLE: 5/5 hip flexion, knee extension  Skin: Skin is warm and dry. Numerous skin lesions. Right inguinal  groin area closed. Psychiatric: She has flat mood and affect. Her behavior is normal. Thought content normal.   Assessment/Plan: 1. Functional deficits secondary to debility which require 3+ hours per day of interdisciplinary therapy in a comprehensive inpatient rehab setting. Physiatrist is providing close team supervision and 24 hour management of active medical problems listed below. Physiatrist and rehab team continue to assess barriers to discharge/monitor patient progress toward functional and medical goals.  Function:  Bathing Bathing position   Position: Wheelchair/chair at sink  Bathing parts Body parts bathed by patient: Right arm, Left arm, Chest, Front perineal area, Abdomen, Left lower leg, Left upper leg, Right upper leg Body parts bathed by helper: Buttocks  Bathing assist Assist Level: Touching or steadying assistance(Pt > 75%)      Upper Body Dressing/Undressing Upper body dressing   What is the patient wearing?: Pull over shirt/dress     Pull over shirt/dress - Perfomed by patient: Thread/unthread right sleeve, Thread/unthread left sleeve, Put head through opening, Pull shirt over trunk          Upper body assist Assist Level: Set up   Set up : To obtain clothing/put away  Lower Body Dressing/Undressing Lower body dressing   What is the patient wearing?: Non-skid slipper socks, Ted Hose, Pants     Pants- Performed by patient: Thread/unthread right pants leg, Thread/unthread  left pants leg Pants- Performed by helper: Pull pants up/down Non-skid slipper socks- Performed by patient: Don/doff left sock Non-skid slipper socks- Performed by helper: Don/doff right sock               TED Hose - Performed by helper: Don/doff left TED hose  Lower body assist Assist for lower body dressing: Touching or steadying assistance (Pt > 75%)      Toileting Toileting Toileting activity did not occur: No continent bowel/bladder event Toileting steps completed by  patient: Adjust clothing prior to toileting Toileting steps completed by helper: Adjust clothing after toileting, Performs perineal hygiene, Adjust clothing prior to toileting    Toileting assist Assist level: Two helpers   Transfers Chair/bed transfer   Chair/bed transfer method: Stand pivot Chair/bed transfer assist level: Supervision or verbal cues Chair/bed transfer assistive device: Armrests, Patent attorney Ambulation activity did not occur: Safety/medical concerns   Max distance: 61ft  Assist level: Supervision or verbal cues   Wheelchair   Type: Manual Max wheelchair distance: 13ft  Assist Level: Supervision or verbal cues  Cognition Comprehension Comprehension assist level: Understands basic 90% of the time/cues < 10% of the time  Expression Expression assist level: Expresses basic 90% of the time/requires cueing < 10% of the time.  Social Interaction Social Interaction assist level: Interacts appropriately 90% of the time - Needs monitoring or encouragement for participation or interaction.  Problem Solving Problem solving assist level: Solves basic 90% of the time/requires cueing < 10% of the time  Memory Memory assist level: Recognizes or recalls 90% of the time/requires cueing < 10% of the time      Medical Problem List and Plan: 1. Deconditioning secondary to multiple medical issues including severe anemia, acute renal failure in pt with hx of right BKA  continue CIR  Prosthesis adjusted by [facility] 2. DVT Prophylaxis/Anticoagulation: Mechanical: Sequential compression devices 3. Pain Management: Norco prn for chronic back pain  Gabapentin 300 TID started on 9/7 4. Mood: Team to provide ego support. LCSW to follow for evaluation and support.  5. Neuropsych: This patient iscapable of making decisions on herown behalf. 6. Skin/Wound Care: Aquacel Silver for MASD gluteal cleft.  7. Fluids/Electrolytes/Nutrition:   Scheduled compazine before  meals for ongoing nausea, ?gastroparesis.   -added megace for appetite 400mg  daily 10 T2DM with neuropathy, nephropathy, vasculopathy and ?gastroparesis:   Monitor BS ac/hs.   SSI for elevated BS.   Elevated in last 24 hours, otherwise, fairly well controlled, will cont to monitor 11. CKD: Avoid NSAIDs and other nephrotoxic drugs.   Hypocalcemia managed with tums.    Encourage fluids, started IVF again qhs x3 (1 more)  Cr. 1.65 on 9/7 Filed Weights   09/09/15 0604 09/12/15 0500 09/13/15 0605  Weight: 91.4 kg (201 lb 9.6 oz) 90.9 kg (200 lb 6.4 oz) 90.9 kg (200 lb 6.4 oz)  12. Bilateral hydronephrosis due to chronic urinary retention/neurogenic bladder:   Failed voiding trial, will retain foley until d/c IVF  Urocholine 10mg  TID started on 9/5, increased to 25 on 9/6  Pt will likely need Urology follow up as outpt  Repeat U/S in a few weeks 13. ABLA on Anemia of chronic disease: On IV iron for supplement--received dose of Aransep on 8/29.  Hb 8.7 on 9/8  Heme Occult +  GI consulted, plan to discuss plan for scoping in 3-5 days inpt vs. outpt 14. Thrombocytopenia: Monitor with serial checks  Plts 54 on 9/7  Spoke  to Heme/Onc Keflex d/ced.  Heme/Onc to evaluate on Monday  Meds reviewed, will cont to monitor 15. Constipation  Bowel reg increased on 9/1 16. HTN and Orthostasis Improving  Coreg 6.25  Cont to monitor 17. Folliculitis: Improving  Right inguinal area  Cont Keflex 9/4-9/8 then d/ced due to improvement and ?thrombocytopenia.  LOS (Days) 9 A FACE TO FACE EVALUATION WAS PERFORMED  Lawrnce Reyez Karis Juba 09/13/2015 11:41 AM

## 2015-09-14 LAB — GLUCOSE, CAPILLARY
Glucose-Capillary: 164 mg/dL — ABNORMAL HIGH (ref 65–99)
Glucose-Capillary: 158 mg/dL — ABNORMAL HIGH (ref 65–99)
Glucose-Capillary: 119 mg/dL — ABNORMAL HIGH (ref 65–99)
Glucose-Capillary: 82 mg/dL (ref 65–99)
Glucose-Capillary: 100 mg/dL — ABNORMAL HIGH (ref 65–99)

## 2015-09-14 MED ORDER — GLIMEPIRIDE 2 MG PO TABS
1.0000 mg | ORAL_TABLET | Freq: Every day | ORAL | Status: DC
  Administered 2015-09-14 – 2015-09-16 (×3): 1 mg via ORAL
  Filled 2015-09-14 (×3): qty 1

## 2015-09-14 NOTE — Progress Notes (Signed)
[place] PHYSICAL MEDICINE & REHABILITATION     PROGRESS NOTE  Subjective/Complaints:  Pt laying in bed this AM.  She has questions about her foley, removal, and discharge plan.   ROS:  Denies CP, SOB, N/V/D.  Objective: Vital Signs: Blood pressure 135/64, pulse 94, temperature 99.8 F (37.7 C), temperature source Oral, resp. rate 18, height 5\' 7"  (1.702 m), weight 91.6 kg (201 lb 14.4 oz), SpO2 93 %. No results found.  Recent Labs  09/12/15 0630  WBC 5.4  HGB 8.7*  HCT 28.0*  PLT 54*   No results for input(s): NA, K, CL, GLUCOSE, BUN, CREATININE, CALCIUM in the last 72 hours.  Invalid input(s): CO CBG (last 3)   Recent Labs  09/13/15 1712 09/13/15 2044 09/14/15 0611  GLUCAP 152* 164* 158*    Wt Readings from Last 3 Encounters:  09/14/15 91.6 kg (201 lb 14.4 oz)  09/03/15 101 kg (222 lb 10.6 oz)  12/14/12 90.7 kg (200 lb)    Physical Exam:  BP 135/64 (BP Location: Left Arm)   Pulse 94   Temp 99.8 F (37.7 C) (Oral)   Resp 18   Ht 5\' 7"  (1.702 m)   Wt 91.6 kg (201 lb 14.4 oz)   LMP  (LMP Unknown)   SpO2 93%   BMI 31.62 kg/m  Constitutional: She appears well-developed and well-nourished. Obese  HENT: Normocephalic and atraumatic.  Eyes: Conjunctivae and EOM are normal.  Cardiovascular: Normal rate and regular rhythm.  No murmur heard. Respiratory: Effort normal and breath sounds normal.  GI: Soft. Bowel sounds are normal. She exhibits no distension. There is no tenderness.  Musculoskeletal: She exhibits edema. No tenderness. R-BKA well healed. Neurological: She is alert and oriented.  Speech clear and follows commands without difficulty.   Sensory deficits LLE, right residual limb and bilateral hands.  Motor: B/l UE: 4+/5 proximal to distal LLE: 4+/5 hip flexion, knee extension, 5/5 ankle dorsi/plantar flexion RLE: 5/5 hip flexion, knee extension   Skin: Skin is warm and dry. Numerous skin lesions. Psychiatric: She has flat mood and affect. Her  behavior is normal. Thought content normal.   Assessment/Plan: 1. Functional deficits secondary to debility which require 3+ hours per day of interdisciplinary therapy in a comprehensive inpatient rehab setting. Physiatrist is providing close team supervision and 24 hour management of active medical problems listed below. Physiatrist and rehab team continue to assess barriers to discharge/monitor patient progress toward functional and medical goals.  Function:  Bathing Bathing position   Position: Wheelchair/chair at sink  Bathing parts Body parts bathed by patient: Right arm, Left arm, Chest, Front perineal area, Abdomen, Left lower leg, Left upper leg, Right upper leg Body parts bathed by helper: Buttocks  Bathing assist Assist Level: Touching or steadying assistance(Pt > 75%)      Upper Body Dressing/Undressing Upper body dressing   What is the patient wearing?: Pull over shirt/dress     Pull over shirt/dress - Perfomed by patient: Thread/unthread right sleeve, Thread/unthread left sleeve, Put head through opening, Pull shirt over trunk          Upper body assist Assist Level: Set up   Set up : To obtain clothing/put away  Lower Body Dressing/Undressing Lower body dressing   What is the patient wearing?: Non-skid slipper socks, Ted Hose, Pants     Pants- Performed by patient: Thread/unthread right pants leg, Thread/unthread left pants leg Pants- Performed by helper: Pull pants up/down Non-skid slipper socks- Performed by patient: Don/doff left sock  Non-skid slipper socks- Performed by helper: Don/doff right sock               TED Hose - Performed by helper: Don/doff left TED hose  Lower body assist Assist for lower body dressing: Touching or steadying assistance (Pt > 75%)      Toileting Toileting Toileting activity did not occur: No continent bowel/bladder event Toileting steps completed by patient: Adjust clothing prior to toileting Toileting steps completed  by helper: Adjust clothing after toileting, Performs perineal hygiene, Adjust clothing prior to toileting    Toileting assist Assist level: Two helpers   Transfers Chair/bed transfer   Chair/bed transfer method: Stand pivot Chair/bed transfer assist level: Supervision or verbal cues Chair/bed transfer assistive device: Armrests, Patent attorney Ambulation activity did not occur: Safety/medical concerns   Max distance: 46ft Assist level: Supervision or verbal cues   Wheelchair   Type: Manual Max wheelchair distance: 120ft  Assist Level: Supervision or verbal cues  Cognition Comprehension Comprehension assist level: Understands basic 90% of the time/cues < 10% of the time  Expression Expression assist level: Expresses basic 90% of the time/requires cueing < 10% of the time.  Social Interaction Social Interaction assist level: Interacts appropriately 90% of the time - Needs monitoring or encouragement for participation or interaction.  Problem Solving Problem solving assist level: Solves basic 90% of the time/requires cueing < 10% of the time  Memory Memory assist level: Recognizes or recalls 90% of the time/requires cueing < 10% of the time      Medical Problem List and Plan: 1. Deconditioning secondary to multiple medical issues including severe anemia, acute renal failure in pt with hx of right BKA  continue CIR  Prosthesis adjusted by [facility] 2. DVT Prophylaxis/Anticoagulation: Mechanical: Sequential compression devices 3. Pain Management: Norco prn for chronic back pain  Gabapentin 300 TID started on 9/7 4. Mood: Team to provide ego support. LCSW to follow for evaluation and support.  5. Neuropsych: This patient iscapable of making decisions on herown behalf. 6. Skin/Wound Care: Aquacel Silver for MASD gluteal cleft.  7. Fluids/Electrolytes/Nutrition:   Scheduled compazine before meals for ongoing nausea, ?gastroparesis.   -added megace for appetite  400mg  daily 10 T2DM with neuropathy, nephropathy, vasculopathy and ?gastroparesis:   Monitor BS ac/hs.   SSI for elevated BS.   ?trending up, will start low dose amaryl 1mg   11. CKD: Avoid NSAIDs and other nephrotoxic drugs.   Hypocalcemia managed with tums.    Encourage fluids, started IVF again qhs x3 (completed)  Cr. 1.65 on 9/7 Filed Weights   09/12/15 0500 09/13/15 0605 09/14/15 0500  Weight: 90.9 kg (200 lb 6.4 oz) 90.9 kg (200 lb 6.4 oz) 91.6 kg (201 lb 14.4 oz)  12. Bilateral hydronephrosis due to chronic urinary retention/neurogenic bladder:   Failed voiding trial, will retain foley until d/c IVF, plan to d/c tomorrow  Urocholine 10mg  TID started on 9/5, increased to 25 on 9/6  Pt will likely need Urology follow up as outpt  Repeat U/S in a few weeks 13. ABLA on Anemia of chronic disease: On IV iron for supplement--received dose of Aransep on 8/29.  Hb 8.7 on 9/8  Heme Occult +  GI consulted, plan to discuss plan for scoping in 3-5 days inpt vs. outpt 14. Thrombocytopenia: Monitor with serial checks  Plts 54 on 9/7  Spoke to Heme/Onc Keflex d/ced.  Heme/Onc to evaluate on Monday  Meds reviewed, will cont to monitor 15. Constipation  Bowel  reg increased on 9/1 16. HTN and Orthostasis Improving  Coreg 6.25  Cont to monitor 17. Folliculitis: Improving  Right inguinal area  Cont Keflex 9/4-9/8 then d/ced due to improvement and ?thrombocytopenia.  LOS (Days) 10 A FACE TO FACE EVALUATION WAS PERFORMED  [name] 09/14/2015 9:28 AM

## 2015-09-15 ENCOUNTER — Inpatient Hospital Stay (HOSPITAL_COMMUNITY): Payer: Medicaid Other | Admitting: Physical Therapy

## 2015-09-15 ENCOUNTER — Inpatient Hospital Stay (HOSPITAL_COMMUNITY): Payer: Medicaid Other

## 2015-09-15 ENCOUNTER — Inpatient Hospital Stay (HOSPITAL_COMMUNITY): Payer: Medicaid Other | Admitting: Occupational Therapy

## 2015-09-15 DIAGNOSIS — B3789 Other sites of candidiasis: Secondary | ICD-10-CM

## 2015-09-15 DIAGNOSIS — R262 Difficulty in walking, not elsewhere classified: Secondary | ICD-10-CM

## 2015-09-15 DIAGNOSIS — N19 Unspecified kidney failure: Secondary | ICD-10-CM

## 2015-09-15 DIAGNOSIS — D649 Anemia, unspecified: Secondary | ICD-10-CM

## 2015-09-15 DIAGNOSIS — E119 Type 2 diabetes mellitus without complications: Secondary | ICD-10-CM

## 2015-09-15 DIAGNOSIS — R0600 Dyspnea, unspecified: Secondary | ICD-10-CM

## 2015-09-15 DIAGNOSIS — G609 Hereditary and idiopathic neuropathy, unspecified: Secondary | ICD-10-CM

## 2015-09-15 LAB — CBC WITH DIFFERENTIAL/PLATELET
Basophils Absolute: 0 10*3/uL (ref 0.0–0.1)
Basophils Relative: 0 %
Eosinophils Absolute: 0.3 10*3/uL (ref 0.0–0.7)
Eosinophils Relative: 4 %
HCT: 30.6 % — ABNORMAL LOW (ref 36.0–46.0)
Hemoglobin: 9.2 g/dL — ABNORMAL LOW (ref 12.0–15.0)
Lymphocytes Relative: 20 %
Lymphs Abs: 1.5 10*3/uL (ref 0.7–4.0)
MCH: 27.2 pg (ref 26.0–34.0)
MCHC: 30.1 g/dL (ref 30.0–36.0)
MCV: 90.5 fL (ref 78.0–100.0)
Monocytes Absolute: 0.5 10*3/uL (ref 0.1–1.0)
Monocytes Relative: 7 %
Neutro Abs: 5.3 10*3/uL (ref 1.7–7.7)
Neutrophils Relative %: 69 %
Platelets: 40 10*3/uL — ABNORMAL LOW (ref 150–400)
RBC: 3.38 MIL/uL — ABNORMAL LOW (ref 3.87–5.11)
RDW: 16.1 % — ABNORMAL HIGH (ref 11.5–15.5)
WBC: 7.6 10*3/uL (ref 4.0–10.5)

## 2015-09-15 LAB — BASIC METABOLIC PANEL
Anion gap: 9 (ref 5–15)
BUN: 36 mg/dL — ABNORMAL HIGH (ref 6–20)
CO2: 23 mmol/L (ref 22–32)
Calcium: 9.2 mg/dL (ref 8.9–10.3)
Chloride: 104 mmol/L (ref 101–111)
Creatinine, Ser: 1.52 mg/dL — ABNORMAL HIGH (ref 0.44–1.00)
GFR calc Af Amer: 46 mL/min — ABNORMAL LOW (ref >60)
GFR calc non Af Amer: 39 mL/min — ABNORMAL LOW (ref >60)
Glucose, Bld: 99 mg/dL (ref 65–99)
Potassium: 4.5 mmol/L (ref 3.5–5.1)
Sodium: 136 mmol/L (ref 135–145)

## 2015-09-15 LAB — GLUCOSE, CAPILLARY
Glucose-Capillary: 97 mg/dL (ref 65–99)
Glucose-Capillary: 97 mg/dL (ref 65–99)
Glucose-Capillary: 84 mg/dL (ref 65–99)
Glucose-Capillary: 115 mg/dL — ABNORMAL HIGH (ref 65–99)

## 2015-09-15 MED ORDER — PEG 3350-KCL-NA BICARB-NACL 420 G PO SOLR
4000.0000 mL | Freq: Once | ORAL | Status: AC
  Administered 2015-09-16: 4000 mL via ORAL
  Filled 2015-09-15: qty 4000

## 2015-09-15 MED ORDER — SODIUM CHLORIDE 0.9 % IV SOLN
INTRAVENOUS | Status: DC
  Administered 2015-09-15: 10 mL/h via INTRAVENOUS

## 2015-09-15 MED ORDER — SALINE SPRAY 0.65 % NA SOLN
1.0000 | Freq: Three times a day (TID) | NASAL | Status: DC
  Administered 2015-09-15 – 2015-09-17 (×6): 1 via NASAL
  Filled 2015-09-15: qty 44

## 2015-09-15 NOTE — Progress Notes (Signed)
IP PROGRESS NOTE  Subjective: Ms. Freiberger remains hospitalized on the rehabilitation service. She reports feeling stronger. She reports a small amount of bleeding at the gluteal wound, no other bleeding. A Foley catheter remains in place. She has been noted to have recurrent thrombocytopenia over the past week.  Objective: Vital signs in last 24 hours: Blood pressure 113/62, pulse 95, temperature 99.8 F (37.7 C), temperature source Oral, resp. rate 18, height '5\' 7"'  (1.702 m), weight 207 lb 14.4 oz (94.3 kg), SpO2 97 %.  Intake/Output from previous day: 09/10 0701 - 09/11 0700 In: 10 [P.O.:720] Out: 5901 [Urine:5900; Stool:1]  Physical Exam:  HEENT: No thrush or bleeding Abdomen: Soft and nontender, no hepatosplenomegaly Skin: Dry healing rash at the upper abdomen, biopsy site at the left breast with a dressing in place, multiple nodular dry lesions over the extremities-improved. Few ecchymoses over the arms.  Lab Results:  Recent Labs  09/15/15 1208  WBC 7.6  HGB 9.2*  HCT 30.6*  PLT 40*   09/04/2015-PT 11.2, PTT 27.4 BMET  Recent Labs  09/15/15 1208  NA 136  K 4.5  CL 104  CO2 23  GLUCOSE 99  BUN 36*  CREATININE 1.52*  CALCIUM 9.2   Erythropoietin level on 08/31/2015-8.1  Studies/Results: No results found.  Medications: I have reviewed the patient's current medications.  Assessment/Plan:  1. Severe anemia and thrombocytopenia-improved following multiple transfusions  Bone marrow biopsy 08/25/2015-no evidence of a hematopoietic malignancy, granulomatous disease, or metastatic carcinoma. Hypercellular marrow  Progressive thrombocytopenia beginning 09/08/2015 2. Renal failure/bilateral hydronephrosis, urinary retention-creatinine improved , good urine output  Etiology of urinary retention not established, potentially related toDiabetic neuropathy 3. Pustular/ulcerated skin lesions/decubitus ulcers-Biopsy from the left breast and left thigh revealed  epidermal hyperplasia with fibrosing granulation tissue 4. Yeast rash beneath the breasts and in the groin-improved 5. Diabetes 6. Status post right below-knee amputation in 2012 7. History of "neuropathy " 8. Coagulopathy-Resolved following vitamin K 9. Deconditioning  Her overall status appears unchanged. She is completing physical therapy on the rehabilitation service and continues to require a Foley catheter for urinary retention. Renal failure has improved.  The platelet count normalized, but has declined over the past week. She was started on cephalexin for a skin infection and this was discontinued on 09/12/2015. Thrombocytopenia can uncommonly be associated with this agent. I cannot associate thrombocytopenia with the current medical regimen. I will review blood smear again over the next few days. We may consider a trial of steroids if the platelets fall further.  Recommendations:  1. Daily CBC 2. Transfuse platelets for a count of less than 10,000 or bleeding 3. Continue iron and Aranesp if the hemoglobin falls  I will follow-up on the platelet count over the next few days and review the blood smear again if the platelet count falls.      LOS: 11 days   Betsy Coder, MD   09/15/2015, 2:14 PM

## 2015-09-15 NOTE — Progress Notes (Signed)
Occupational Therapy Session Note  Patient Details  Name: [name] MRN: [id number] Date of Birth: 1965/06/06  Today's Date: 09/15/2015   Session 1 OT Individual Time: 0903-1000 OT Individual Time Calculation (min): 57 min   Session 2 OT Individual Time: 4235-3614 OT Individual Time Calculation (min): 30 min    Short Term Goals: Week 2:  OT Short Term Goal 1 (Week 2): Continue working on long term goals at [employer] I at wheelchair level  Skilled Therapeutic Interventions/Progress Updates:    Session 1 1:1 session focused on ADL/self-care retraining, dynamic standing balance, and safety awareness. Modified bathing and dressing completed sitting at sink in wheelchair with supervision for safety and min questioning cues for problem solving safe techniques. Pt required Min A to thread catheter bag through LE garments, and may be d/c home with foley; need to clarify. L stump shrinker doffed only for bathing. Pt able to don/doff shrinker with supervision and min verbal cues for sequence. Pt left seated in w/c at end of session with needs met.   Session 2 Pt seen for 1:1 OT session with focus on transfers, functional mobility, w/c management, and fine motor coordination. Pt propelled w/c to therapy gym  and OT educated pt on how family can bump w/c up from behind to get up small ledge in the home. Pt verbalized understanding. Pt then ambulated ~10 ft w/ RW and supervision. Pt worked on Research scientist (medical) using peg board with focus on shift, rotation, and translation. Pt returned to room at end of session and left in w/c with needs met.   Therapy Documentation Precautions:  Precautions Precautions: Fall Precaution Comments: R BKA 2013 Restrictions Weight Bearing Restrictions: No   Therapy Vitals Temp: 98.7 F (37.1 C) Temp Source: Oral Pulse Rate: 94 Resp: 18 BP: 138/71 Patient Position (if appropriate): Sitting Oxygen Therapy SpO2: 100 % O2  Device: Not Delivered Pain: Pain Assessment Pain Assessment: 0-10 Pain Score: 4  Pain Type: Acute pain Pain Location: Leg Pain Orientation: Left Pain Descriptors / Indicators: Aching Pain Onset: With Activity Pain Intervention(s): Repositioned ADL: ADL ADL Comments: see functional navigator  See Function Navigator for Current Functional Status.   Therapy/Group: Individual Therapy  Valma Cava 09/15/2015, 2:54 PM

## 2015-09-15 NOTE — Progress Notes (Signed)
Physical Therapy Session Note  Patient Details  Name: [name] MRN: [id number] Date of Birth: 04/24/1965  Today's Date: 09/15/2015 PT Individual Time: 806-903and 1117-1201 PT Individual Time Calculation (min):  57 min and 44 min    Short Term Goals: Week 2:  PT Short Term Goal 1 (Week 2): LTG=STG due to estimated DC date.   Skilled Therapeutic Interventions/Progress Updates:    Treatment 1: Pt received sitting on EOB & agreeable to PT. Pt noted 7-8/10 R ear pain & intermittent stabbing pain in RLE; RN made aware. Discussed home set up & pt reports she has to perform stand pivot w/c<>toilet in home, and negotiate single step (1/2 - 1 inch) to enter/exit home. Pt reports she used to ambulate ~15 ft door>garage but lately has used w/c instead 2/2 weakness.  Pt reports she propels w/c to door, descends small single step with RW, her son brings w/c down step & she turns to sit in it. Pt also reports she enters home in reverse method by standing from w/c, waiting for w/c to be placed on step then pivots & sits in w/c and bypasses step negotiation. Discussed potential for pt to propel w/c or have son assist with negotiating step into house via w/c since it is only 1/2 -1 inch tall (per pt report); pt does not feel this is a feasible task. Pt reported "I feel like I am going to the bathroom"; pt with incontinent BM. Pt completed stand pivot bed>w/c with supervision/steady assist and transported into bathroom via w/c total assist for time management. Pt completed stand pivot w/c>toilet with use of grab bar & required maximum encouragement & education to manage brief. Pt not pleased with having to manage brief, as she reports she will be wearing underwear at home; encouraged pt to perform tasks as similar to how she would at home as she is discharging in two days. On toilet pt with continent BM performed peri-hygiene in sitting and standing with supervision. Pt returned to w/c via stand pivot with  supervision. Pt left in handoff to OT.  Treatment 2: Pt received in w/c denying c/o pain. Session focused on w/c mobility and d/c planning. Pt propelled w/c x 150 ft throughout unit with BUE & LLE (pt reports she propels w/c in this manner PTA). Measured pt for w/c & notified LCSW of pt's DME needs; pt will require 20x18 w/c with R amputee support pad & 3-in-1 BSC. Discussed home entry with pt who reports step is ~1 inch tall; educated pt on ability to pull w/c up step backwards with assistance from son. Pt began having nosebleed & returned to room via w/c total assist; RN made aware. Discussed need for pt to wear RLE shrinker at all times except for bathing to help shape & reduce limb size for potential prosthetic wear in future. Discussed potential therapy follow up after d/c depending upon insurance. At end of session pt left sitting in w/c with nosebleed decreasing.  Therapy Documentation Precautions:  Precautions Precautions: Fall Precaution Comments: R BKA 2013 Restrictions Weight Bearing Restrictions: No   See Function Navigator for Current Functional Status.   Therapy/Group: Individual Therapy  [name] 09/15/2015, 12:39 PM

## 2015-09-15 NOTE — Progress Notes (Signed)
Social Work Patient ID: [name], female   DOB: May 27, 1965, 50 y.o.   MRN: [id number]  Met with pt to discuss discharge on Wed. She reports her wheelchair at home is broken and she needs another one. Discussed she has no insurance and would need to pay for it. She has applied for Medicaid but it is not approved, will see what [facility] is willing to do since she got her other wheelchair from them. She also will need a 3 in 1. Will contact financial counselor to see the status of her Medicaid application. Work on discharge for [employer].

## 2015-09-15 NOTE — Progress Notes (Signed)
Patient ID: Cheryl HansenMichelle L Bohlen, female   DOB: 05/22/1965, 50 y.o.   MRN: [id number] [facility]  Cheryl HansenMichelle L Kamphuis 50 y.o. 05/28/1965   Subjective: Feels good. Tolerating diet. In wheelchair in room.  Objective: Vital signs in last 24 hours: Vitals:   09/15/15 0621 09/15/15 0818  BP: 137/65 113/62  Pulse: 99 95  Resp: 18   Temp: 99.8 F (37.7 C)     Physical Exam: Gen: alert, no acute distress, in wheelchair CV: RRR Chest: CTA B Abd: soft, nontender, nondistended, +BS  Lab Results:  Recent Labs  09/15/15 1208  NA 136  K 4.5  CL 104  CO2 23  GLUCOSE 99  BUN 36*  CREATININE 1.52*  CALCIUM 9.2   No results for input(s): AST, ALT, ALKPHOS, BILITOT, PROT, ALBUMIN in the last 72 hours.  Recent Labs  09/15/15 1208  WBC 7.6  NEUTROABS 5.3  HGB 9.2*  HCT 30.6*  MCV 90.5  PLT 40*   No results for input(s): LABPROT, INR in the last 72 hours.    Assessment/Plan: Anemia - Colon/EGD planned for 09/17/15 as inpt and will prep tomorrow. Clear liquid diet tomorrow. Ok to be discharged Wed afternoon from a GI standpoint unless results of GI procedures warrant further inpt care.   Nishawn Rotan C. 09/15/2015, 1:25 PM  Pager [phone]  If no answer or after 5 PM call [phone]

## 2015-09-15 NOTE — Progress Notes (Addendum)
[place] PHYSICAL MEDICINE & REHABILITATION     PROGRESS NOTE  Subjective/Complaints:  Pt seen sitting up at the EOB this AM.  She slept well, but notes intermittent pain this AM.  She has not asked for pain medications.  She is confused about the plans for GI.    ROS:  Denies CP, SOB, N/V/D.  Objective: Vital Signs: Blood pressure 113/62, pulse 95, temperature 99.8 F (37.7 C), temperature source Oral, resp. rate 18, height 5\' 7"  (1.702 m), weight 94.3 kg (207 lb 14.4 oz), SpO2 97 %. No results found. No results for input(s): WBC, HGB, HCT, PLT in the last 72 hours. No results for input(s): NA, K, CL, GLUCOSE, BUN, CREATININE, CALCIUM in the last 72 hours.  Invalid input(s): CO CBG (last 3)   Recent Labs  09/14/15 1655 09/14/15 2045 09/15/15 0620  GLUCAP 82 100* 97    Wt Readings from Last 3 Encounters:  09/15/15 94.3 kg (207 lb 14.4 oz)  09/03/15 101 kg (222 lb 10.6 oz)  12/14/12 90.7 kg (200 lb)    Physical Exam:  BP 113/62   Pulse 95   Temp 99.8 F (37.7 C) (Oral)   Resp 18   Ht 5\' 7"  (1.702 m)   Wt 94.3 kg (207 lb 14.4 oz)   LMP  (LMP Unknown)   SpO2 97%   BMI 32.56 kg/m  Constitutional: She appears well-developed and well-nourished. Obese  HENT: Normocephalic and atraumatic.  Eyes: Conjunctivae and EOM are normal.  Cardiovascular: Normal rate and regular rhythm.  No murmur heard. Respiratory: Effort normal and breath sounds normal.  GI: Soft. Bowel sounds are normal. She exhibits no distension. There is no tenderness.  Musculoskeletal: She exhibits edema. No tenderness. R-BKA well healed. Neurological: She is alert and oriented.  Speech clear and follows commands without difficulty.   Sensory deficits LLE, right residual limb and bilateral hands.  Motor: B/l UE: 4+/5 proximal to distal LLE: 4+/5 hip flexion, knee extension, 5/5 ankle dorsi/plantar flexion RLE: 5/5 hip flexion, knee extension   Skin: Skin is warm and dry. Numerous skin  lesions. Psychiatric: She has flat mood and affect. Her behavior is normal. Thought content normal.   Assessment/Plan: 1. Functional deficits secondary to debility which require 3+ hours per day of interdisciplinary therapy in a comprehensive inpatient rehab setting. Physiatrist is providing close team supervision and 24 hour management of active medical problems listed below. Physiatrist and rehab team continue to assess barriers to discharge/monitor patient progress toward functional and medical goals.  Function:  Bathing Bathing position   Position: Wheelchair/chair at sink  Bathing parts Body parts bathed by patient: Right arm, Left arm, Chest, Front perineal area, Abdomen, Left lower leg, Left upper leg, Right upper leg Body parts bathed by helper: Buttocks  Bathing assist Assist Level: Touching or steadying assistance(Pt > 75%)      Upper Body Dressing/Undressing Upper body dressing   What is the patient wearing?: Pull over shirt/dress     Pull over shirt/dress - Perfomed by patient: Thread/unthread right sleeve, Thread/unthread left sleeve, Put head through opening, Pull shirt over trunk          Upper body assist Assist Level: Set up   Set up : To obtain clothing/put away  Lower Body Dressing/Undressing Lower body dressing   What is the patient wearing?: Non-skid slipper socks, Ted Hose, Pants     Pants- Performed by patient: Thread/unthread right pants leg, Thread/unthread left pants leg Pants- Performed by helper: Pull pants up/down  Non-skid slipper socks- Performed by patient: Don/doff left sock Non-skid slipper socks- Performed by helper: Don/doff right sock               TED Hose - Performed by helper: Don/doff left TED hose  Lower body assist Assist for lower body dressing: Touching or steadying assistance (Pt > 75%)      Toileting Toileting Toileting activity did not occur: No continent bowel/bladder event Toileting steps completed by patient:  Adjust clothing prior to toileting Toileting steps completed by helper: Adjust clothing after toileting, Performs perineal hygiene, Adjust clothing prior to toileting    Toileting assist Assist level: Two helpers   Transfers Chair/bed transfer   Chair/bed transfer method: Stand pivot Chair/bed transfer assist level: Supervision or verbal cues Chair/bed transfer assistive device: Armrests, Patent attorney Ambulation activity did not occur: Safety/medical concerns   Max distance: 52ft Assist level: Supervision or verbal cues   Wheelchair   Type: Manual Max wheelchair distance: 166ft  Assist Level: Supervision or verbal cues  Cognition Comprehension Comprehension assist level: Understands basic 90% of the time/cues < 10% of the time  Expression Expression assist level: Expresses basic 90% of the time/requires cueing < 10% of the time.  Social Interaction Social Interaction assist level: Interacts appropriately 90% of the time - Needs monitoring or encouragement for participation or interaction.  Problem Solving Problem solving assist level: Solves basic 90% of the time/requires cueing < 10% of the time  Memory Memory assist level: Recognizes or recalls 90% of the time/requires cueing < 10% of the time      Medical Problem List and Plan: 1. Deconditioning secondary to multiple medical issues including severe anemia, acute renal failure in pt with hx of right BKA  continue CIR  Prosthesis adjusted by [employer] 2. DVT Prophylaxis/Anticoagulation: Mechanical: Sequential compression devices 3. Pain Management: Norco prn for chronic back pain  Gabapentin 300 TID started on 9/7 4. Mood: Team to provide ego support. LCSW to follow for evaluation and support.  5. Neuropsych: This patient iscapable of making decisions on herown behalf. 6. Skin/Wound Care: Aquacel Silver for MASD gluteal cleft.  7. Fluids/Electrolytes/Nutrition:   Scheduled compazine before meals for  ongoing nausea, ?gastroparesis.   -added megace for appetite 400mg  daily 10 T2DM with neuropathy, nephropathy, vasculopathy and ?gastroparesis:   Monitor BS ac/hs.   SSI for elevated BS.   ?trending up, started low dose amaryl 1mg    Improving 11. CKD: Avoid NSAIDs and other nephrotoxic drugs.   Hypocalcemia managed with tums.    Encourage fluids, started IVF again qhs x3 (completed)  Cr. 1.65 on 9/7  Labs pending Filed Weights   09/13/15 0605 09/14/15 0500 09/15/15 0704  Weight: 90.9 kg (200 lb 6.4 oz) 91.6 kg (201 lb 14.4 oz) 94.3 kg (207 lb 14.4 oz)  12. Bilateral hydronephrosis due to chronic urinary retention/neurogenic bladder:   Failed voiding trial, will retain foley until d/c IVF, foley d/ced  Urocholine 10mg  TID started on 9/5, increased to 25 on 9/6  Pt will likely need Urology follow up as outpt  Repeat U/S in a few weeks  Labs pending 13. ABLA on Anemia of chronic disease: On IV iron for supplement--received dose of Aransep on 8/29.  Hb 8.7 on 9/8  Heme Occult +  GI consulted, plan to discuss plan for scoping in 3-5 days inpt vs. Outpt, await further plans 14. Thrombocytopenia: Monitor with serial checks  Plts 54 on 9/7  Spoke to Heme/Onc Keflex d/ced.  Heme/Onc to evaluate on Monday  Meds reviewed, will cont to monitor  Await Heme/Onc plans 15. Constipation  Bowel reg increased on 9/1 16. HTN and Orthostasis Improving  Coreg 6.25  Cont to monitor 17. Folliculitis: Improving  Right inguinal area  Cont Keflex 9/4-9/8 then d/ced due to improvement and ?thrombocytopenia.  LOS (Days) 11 A FACE TO FACE EVALUATION WAS PERFORMED  Ankit Karis Jubanil Patel 09/15/2015 9:18 AM

## 2015-09-16 ENCOUNTER — Inpatient Hospital Stay (HOSPITAL_COMMUNITY): Payer: Medicaid Other | Admitting: Occupational Therapy

## 2015-09-16 ENCOUNTER — Inpatient Hospital Stay (HOSPITAL_COMMUNITY): Payer: Medicaid Other | Admitting: Physical Therapy

## 2015-09-16 ENCOUNTER — Inpatient Hospital Stay (HOSPITAL_COMMUNITY): Payer: Medicaid Other

## 2015-09-16 DIAGNOSIS — R509 Fever, unspecified: Secondary | ICD-10-CM | POA: Insufficient documentation

## 2015-09-16 LAB — URINALYSIS, ROUTINE W REFLEX MICROSCOPIC
Bilirubin Urine: NEGATIVE
Glucose, UA: NEGATIVE mg/dL
Hgb urine dipstick: NEGATIVE
Ketones, ur: NEGATIVE mg/dL
Nitrite: NEGATIVE
Protein, ur: NEGATIVE mg/dL
Specific Gravity, Urine: <1.005 — ABNORMAL LOW (ref 1.005–1.030)
pH: 6 (ref 5.0–8.0)

## 2015-09-16 LAB — CBC WITH DIFFERENTIAL/PLATELET
Basophils Absolute: 0 10*3/uL (ref 0.0–0.1)
Basophils Relative: 0 %
Eosinophils Absolute: 0.2 10*3/uL (ref 0.0–0.7)
Eosinophils Relative: 2 %
HCT: 27.5 % — ABNORMAL LOW (ref 36.0–46.0)
Hemoglobin: 8.3 g/dL — ABNORMAL LOW (ref 12.0–15.0)
Lymphocytes Relative: 19 %
Lymphs Abs: 1.4 10*3/uL (ref 0.7–4.0)
MCH: 27.2 pg (ref 26.0–34.0)
MCHC: 30.2 g/dL (ref 30.0–36.0)
MCV: 90.2 fL (ref 78.0–100.0)
Monocytes Absolute: 0.5 10*3/uL (ref 0.1–1.0)
Monocytes Relative: 6 %
Neutro Abs: 5.5 10*3/uL (ref 1.7–7.7)
Neutrophils Relative %: 73 %
Platelets: 43 10*3/uL — ABNORMAL LOW (ref 150–400)
RBC: 3.05 MIL/uL — ABNORMAL LOW (ref 3.87–5.11)
RDW: 15.9 % — ABNORMAL HIGH (ref 11.5–15.5)
WBC: 7.5 10*3/uL (ref 4.0–10.5)

## 2015-09-16 LAB — GLUCOSE, CAPILLARY
Glucose-Capillary: 114 mg/dL — ABNORMAL HIGH (ref 65–99)
Glucose-Capillary: 63 mg/dL — ABNORMAL LOW (ref 65–99)
Glucose-Capillary: 166 mg/dL — ABNORMAL HIGH (ref 65–99)
Glucose-Capillary: 30 mg/dL — CL (ref 65–99)
Glucose-Capillary: 36 mg/dL — CL (ref 65–99)
Glucose-Capillary: 193 mg/dL — ABNORMAL HIGH (ref 65–99)
Glucose-Capillary: 122 mg/dL — ABNORMAL HIGH (ref 65–99)

## 2015-09-16 LAB — URINE MICROSCOPIC-ADD ON: RBC / HPF: NONE SEEN RBC/hpf (ref 0–5)

## 2015-09-16 LAB — SAVE SMEAR

## 2015-09-16 MED ORDER — FERROUS SULFATE 325 (65 FE) MG PO TABS
325.0000 mg | ORAL_TABLET | Freq: Two times a day (BID) | ORAL | Status: DC
  Administered 2015-09-17 – 2015-09-18 (×2): 325 mg via ORAL
  Filled 2015-09-16 (×2): qty 1

## 2015-09-16 MED ORDER — DEXTROSE 50 % IV SOLN
1.0000 | Freq: Once | INTRAVENOUS | Status: AC
  Administered 2015-09-16: 50 mL via INTRAVENOUS

## 2015-09-16 MED ORDER — DARBEPOETIN ALFA 60 MCG/0.3ML IJ SOSY
60.0000 ug | PREFILLED_SYRINGE | Freq: Once | INTRAMUSCULAR | Status: AC
  Administered 2015-09-16: 60 ug via SUBCUTANEOUS
  Filled 2015-09-16: qty 0.3

## 2015-09-16 MED ORDER — DEXTROSE-NACL 5-0.45 % IV SOLN
INTRAVENOUS | Status: DC
  Administered 2015-09-16: 17:00:00 via INTRAVENOUS

## 2015-09-16 NOTE — Progress Notes (Signed)
Recreational Therapy Discharge Summary Patient Details  Name: [name] MRN: [id number] Date of Birth: 11/26/1965 Today's Date: 09/16/2015  Long term goals set: 1  Long term goals met: 1  Comments on progress toward goals: Pt has made good progress toward goal and is ready for discharge home at Mod I for simple TR tasks seated.  Pt requires extra time for task completion.  Pt educated on energy conservation techniques & activity analysis with potential modifications for leisure participation post discharge.  Reasons for discharge: discharge from hospital  Patient/family agrees with progress made and goals achieved: Yes    Session note:  Education with pt about importance of leisure and it's impact on overall health & wellness.  Reviewed activity analysis with potential modifications for leisure participation in which pt stated understanding.  Also discuss community reintegration strategies with focus on energy conservation.  Pt requested to use the bathroom and performed stand pivot transfer using grab bar with supervision.  No c/o pain this session.   [place] 09/16/2015, 1:36 PM

## 2015-09-16 NOTE — Progress Notes (Signed)
Social Work Patient ID: Cheryl HansenMichelle L Ballengee, female   DOB: 07/07/1965, 50 y.o.   MRN: [id number]  Pam-PA reports with planned test tomorrow likely pt to go home Thursday. Have informed team and pt aware of the  Plan.

## 2015-09-16 NOTE — Progress Notes (Signed)
Occupational Therapy Session Note  Patient Details  Name: [name] MRN: [id number] Date of Birth: 1965/08/09  Today's Date: 09/16/2015   Session 1 OT Individual Time: 0900-1000 OT Individual Time Calculation (min): 60 min   Session 2 OT Individual Time: 1415-1530 OT Individual Time Calculation (min): 75 min    Short Term Goals: Week 2:  OT Short Term Goal 1 (Week 2): Continue working on long term goals at [employer] I at wheelchair level  Skilled Therapeutic Interventions/Progress Updates:    Session 1 1:1 OT session focused on ADL/self-care training and functional transfers. Modified Squat pivot from bed>w/c, Mod I. Pt able to propel w/c within the room, collect clothing, and position w/c in front of sink for bathing/dressing tasks with Mod I. Pt maintained standing balance using sink for support to wash buttocks and pull up pants without assistance and with 0 LOB. Pt transferred onto [facility] over toilet using grab bars with Mod I. Pt felt she was unable to have Bm, but had smear of BM on toilet paper; pt completed toileting, then transfered back to w/c without assistance using grab bars and rails of BSC. Pt left seated in w/c at end of session with needs met.   Session 2 1:1 skilled OT session focused on wheelchair mobility within community, patient education regarding home safety awareness, IADL and ADL modifications, and donning clothing with catheter bag. RN finishing up w/ catheter and emptying bladder. Pt unable to self cath safely 2/2 low vision and decreased bilateral hand sensation/stereognosis needed for self-cathing. Per CM , pt to go home with foley catheter- OT dicussed techniques for LB dressing and bathing with foley bag. Pt's new w/c and BSC delivered to room. OT adjusted w/c to pt, pt transferred from bed>w/c w/ mod I, then propelled w/c to elevators, able to push button, and negotiate getting in/out of elevator and propel wheelchair on different terrain within hospital. Pt  returned to room and was left in care of Rn .   Therapy Documentation Precautions:  Precautions Precautions: Fall Precaution Comments: R BKA (2013) Restrictions Weight Bearing Restrictions: No General:  Pain: Pain Assessment Pain Assessment: No/denies pain ADL: ADL ADL Comments: see functional navigator  See Function Navigator for Current Functional Status.   Therapy/Group: Individual Therapy  Valma Cava 09/16/2015, 5:05 PM

## 2015-09-16 NOTE — Plan of Care (Signed)
Problem: RH Balance Goal: LTG Patient will maintain dynamic standing balance (PT) LTG:  Patient will maintain dynamic standing balance with assistance during mobility activities (PT)  Outcome: Completed/Met Date Met: 09/16/15 With RW  Problem: RH Furniture Transfers Goal: LTG Patient will perform furniture transfers w/assist (OT/PT LTG: Patient will perform furniture transfers  with assistance (OT/PT).  Outcome: Not Met (add Reason) For safety  Problem: RH Ambulation Goal: LTG Patient will ambulate in controlled environment (PT) LTG: Patient will ambulate in a controlled environment, # of feet with assistance (PT).  Outcome: Completed/Met Date Met: 09/16/15 40 ft with RW Goal: LTG Patient will ambulate in home environment (PT) LTG: Patient will ambulate in home environment, # of feet with assistance (PT).  Outcome: Adequate for Discharge Pt to use w/c at home  Problem: RH Wheelchair Mobility Goal: LTG Patient will propel w/c in controlled environment (PT) LTG: Patient will propel wheelchair in controlled environment, # of feet with assist (PT)  Outcome: Completed/Met Date Met: 09/16/15 150 ft Goal: LTG Patient will propel w/c in home environment (PT) LTG: Patient will propel wheelchair in home environment, # of feet with assistance (PT).  Outcome: Completed/Met Date Met: 09/16/15 50 ft  Problem: RH Stairs Goal: LTG Patient will ambulate up and down stairs w/assist (PT) LTG: Patient will ambulate up and down # of stairs with assistance (PT)  Outcome: Adequate for Discharge Pt plans to negotiate 1 inch step in w/c with assistance from family member

## 2015-09-16 NOTE — Progress Notes (Addendum)
[place] PHYSICAL MEDICINE & REHABILITATION     PROGRESS NOTE  Subjective/Complaints:  Pt sitting up in bed eating breakfast.  She was looking forward to scrambled eggs, but was started on a liquid diet.  She has questions about the plans after scope tomorrow.   ROS:  Denies CP, SOB, N/V/D.  Objective: Vital Signs: Blood pressure 119/60, pulse 90, temperature (!) 100.4 F (38 C), resp. rate 17, height 5\' 7"  (1.702 m), weight 95.7 kg (211 lb 0.5 oz), SpO2 98 %. No results found.  Recent Labs  09/15/15 1208 09/16/15 0654  WBC 7.6 7.5  HGB 9.2* 8.3*  HCT 30.6* 27.5*  PLT 40* 43*    Recent Labs  09/15/15 1208  NA 136  K 4.5  CL 104  GLUCOSE 99  BUN 36*  CREATININE 1.52*  CALCIUM 9.2   CBG (last 3)   Recent Labs  09/15/15 1655 09/15/15 2039 09/16/15 0643  GLUCAP 84 115* 114*    Wt Readings from Last 3 Encounters:  09/16/15 95.7 kg (211 lb 0.5 oz)  09/03/15 101 kg (222 lb 10.6 oz)  12/14/12 90.7 kg (200 lb)    Physical Exam:  BP 119/60 (BP Location: Right Arm)   Pulse 90   Temp (!) 100.4 F (38 C)   Resp 17   Ht 5\' 7"  (1.702 m)   Wt 95.7 kg (211 lb 0.5 oz)   LMP  (LMP Unknown)   SpO2 98%   BMI 33.05 kg/m  Constitutional: She appears well-developed and well-nourished. Obese  HENT: Normocephalic and atraumatic.  Eyes: Conjunctivae and EOM are normal.  Cardiovascular: Normal rate and regular rhythm.  No murmur heard. Respiratory: Effort normal and breath sounds normal.  GI: Soft. Bowel sounds are normal. She exhibits no distension. There is no tenderness.  Musculoskeletal: She exhibits edema. No tenderness. R-BKA well healed. Neurological: She is alert and oriented.  Speech clear and follows commands without difficulty.   Sensory deficits LLE, right residual limb and bilateral hands.  Motor: B/l UE: 4+/5 proximal to distal LLE: 4+/5 hip flexion, knee extension, 5/5 ankle dorsi/plantar flexion RLE: 5/5 hip flexion, knee extension   Skin: Skin is  warm and dry. Numerous skin lesions. Psychiatric: She has flat mood (slightly improved). Her behavior is normal. Thought content normal.   Assessment/Plan: 1. Functional deficits secondary to debility which require 3+ hours per day of interdisciplinary therapy in a comprehensive inpatient rehab setting. Physiatrist is providing close team supervision and 24 hour management of active medical problems listed below. Physiatrist and rehab team continue to assess barriers to discharge/monitor patient progress toward functional and medical goals.  Function:  Bathing Bathing position   Position: Wheelchair/chair at sink  Bathing parts Body parts bathed by patient: Right arm, Left arm, Chest, Abdomen, Front perineal area, Buttocks, Right upper leg, Left lower leg, Right lower leg, Left upper leg Body parts bathed by helper: Buttocks  Bathing assist Assist Level: Set up, More than reasonable time   Set up : To open containers  Upper Body Dressing/Undressing Upper body dressing   What is the patient wearing?: Pull over shirt/dress     Pull over shirt/dress - Perfomed by patient: Thread/unthread left sleeve, Thread/unthread right sleeve, Put head through opening, Pull shirt over trunk          Upper body assist Assist Level: No help, No cues   Set up : To obtain clothing/put away  Lower Body Dressing/Undressing Lower body dressing   What is the patient wearing?:  Pants, Non-skid slipper socks, Underwear Underwear - Performed by patient: Thread/unthread right underwear leg, Thread/unthread left underwear leg, Pull underwear up/down   Pants- Performed by patient: Thread/unthread left pants leg, Thread/unthread right pants leg, Pull pants up/down Pants- Performed by helper: Pull pants up/down Non-skid slipper socks- Performed by patient: Don/doff left sock Non-skid slipper socks- Performed by helper: Don/doff right sock               TED Hose - Performed by helper: Don/doff left TED  hose  Lower body assist Assist for lower body dressing: Supervision or verbal cues      Toileting Toileting Toileting activity did not occur: No continent bowel/bladder event Toileting steps completed by patient: Adjust clothing prior to toileting Toileting steps completed by helper: Adjust clothing after toileting, Performs perineal hygiene, Adjust clothing prior to toileting    Toileting assist Assist level: Two helpers   Transfers Chair/bed transfer   Chair/bed transfer method: Stand pivot Chair/bed transfer assist level: Supervision or verbal cues Chair/bed transfer assistive device: Armrests, Bedrails     Locomotion Ambulation Ambulation activity did not occur: Safety/medical concerns   Max distance: 77ft Assist level: Supervision or verbal cues   Wheelchair   Type: Manual Max wheelchair distance: 150 ft Assist Level: Supervision or verbal cues  Cognition Comprehension Comprehension assist level: Understands basic 90% of the time/cues < 10% of the time  Expression Expression assist level: Expresses basic 90% of the time/requires cueing < 10% of the time.  Social Interaction Social Interaction assist level: Interacts appropriately 90% of the time - Needs monitoring or encouragement for participation or interaction.  Problem Solving Problem solving assist level: Solves basic 90% of the time/requires cueing < 10% of the time  Memory Memory assist level: Recognizes or recalls 90% of the time/requires cueing < 10% of the time      Medical Problem List and Plan: 1. Deconditioning secondary to multiple medical issues including severe anemia, acute renal failure in pt with hx of right BKA  continue CIR  Prosthesis adjusted by [employer]  Likely d/c tomorrow after scope  Will see pt for transitional care management in 1-2 weeks. 2. DVT Prophylaxis/Anticoagulation: Mechanical: Sequential compression devices 3. Pain Management: Norco prn for chronic back pain  Gabapentin 300 TID  started on 9/7 4. Mood: Team to provide ego support. LCSW to follow for evaluation and support.  5. Neuropsych: This patient iscapable of making decisions on herown behalf. 6. Skin/Wound Care: Aquacel Silver for MASD gluteal cleft.  7. Fluids/Electrolytes/Nutrition:   Scheduled compazine before meals for ongoing nausea, ?gastroparesis.   -added megace for appetite 400mg  daily 10 T2DM with neuropathy, nephropathy, vasculopathy and ?gastroparesis:   Monitor BS ac/hs.   SSI for elevated BS.   Started low dose amaryl 1mg    Improving 11. CKD: Avoid NSAIDs and other nephrotoxic drugs.   Hypocalcemia managed with tums.    Encourage fluids, started IVF qhs x3 (completed)  Cr. 1.52 on 9/11 Filed Weights   09/14/15 0500 09/15/15 0704 09/16/15 0500  Weight: 91.6 kg (201 lb 14.4 oz) 94.3 kg (207 lb 14.4 oz) 95.7 kg (211 lb 0.5 oz)  12. Bilateral hydronephrosis due to chronic urinary retention/neurogenic bladder:   Failed voiding trial, plan was initially to d/c foley after IVF, however, pt being prepped for scope.  Will maintain foley  Urocholine 10mg  TID started on 9/5, increased to 25 on 9/6  Pt will likely need Urology follow up as outpt  Repeat U/S in a few weeks 13.  ABLA on Anemia of chronic disease: On IV iron for supplement--received dose of Aransep on 8/29.  Hb 8.3 on 9/12  Heme Occult +  GI consulted, plan for scope 9/13 14. Thrombocytopenia: Monitor with serial checks  Plts 43 on 9/12  NO resistive excercises  Spoke to Heme/Onc Keflex d/ced.    Heme/Onc following for ?smear eval with ?steroid trial  Meds reviewed, will cont to monitor 15. Constipation  Bowel reg increased on 9/1 16. HTN and Orthostasis Improving  Coreg 6.25  Cont to monitor 17. Folliculitis: Resolved  Right inguinal area  Keflex 9/4-9/8 then d/ced due to improvement and ?thrombocytopenia. 18. Fever  Urine cx ordered  Blood cx ordered  CXR ordered  LOS (Days) 12 A FACE TO FACE EVALUATION WAS  PERFORMED  Ankit Karis [place] 09/16/2015 8:58 AM

## 2015-09-16 NOTE — Progress Notes (Signed)
Social Work Patient ID: Cheryl HansenMichelle L Wolf, female   DOB: 09/03/1965, 50 y.o.   MRN: [id number]  [employer] application completed with pt and faxed in. Pt aware will be contacted for intake in-person interview. Gave pt original application for her file. Also discussed using [employer] instead of a taxi service due to cheaper per ride. Pt will be glad when her test tomorrow is over, aware plan for discharge Thursday.

## 2015-09-16 NOTE — Progress Notes (Signed)
Occupational Therapy Discharge Summary  Patient Details  Name: [name] MRN: [id number] Date of Birth: [birth year]  Today's Date: 09/16/2015  Patient has met 8 of 9 long term goals due to improved activity tolerance, increased independence with bathing, dressing, and toileting, improved balance, improved awareness, and ability to compensate for deficits.  Patient to discharge at overall Modified Independent at wheelchair level with ADLs/self-care management. Patient's care partner is independent to provide the necessary physical and cognitive assistance at discharge.    Reasons goals not met: Pt will require supervision for functional toilet transfers for safety with task.  Recommendation:  Patient will benefit from ongoing skilled OT services in home health setting to continue to advance functional skills in the area of BADL/self-care and iADL/homemaking tasks.  Equipment: W/c (20x18) w/ amputee support pad, BSC, already delivered  Reasons for discharge: treatment goals met and discharge from hospital  Patient/family agrees with progress made and goals achieved: Yes  OT Discharge Precautions/Restrictions Precautions Precautions: Fall Precaution Comments: R BKA (2013) Restrictions Weight Bearing Restrictions: No General   Vital Signs Therapy Vitals Temp: 97.8 F (36.6 C) Temp Source: Oral Pulse Rate: 80 Resp: 18 BP: 116/66 Patient Position (if appropriate): Sitting Oxygen Therapy SpO2: 100 % O2 Device: Not Delivered Pain Pain Assessment Pain Assessment: No/denies pain ADL ADL ADL Comments: See functional navigator Vision/Perception     Cognition Arousal/Alertness: Awake/alert Orientation Level: Oriented X4 Comments: baseline prolem solving deficits likely from history of stroke Sensation Sensation Stereognosis: Impaired by gross assessment Hot/Cold: Impaired by gross assessment Coordination Gross Motor Movements are Fluid and Coordinated: Yes Fine  Motor Movements are Fluid and Coordinated: No   Mobility  Transfers Sit to Stand: 6: Modified independent (Device/Increase time)     Balance Dynamic Standing Balance Dynamic Standing - Balance Support: During functional activity Dynamic Standing - Level of Assistance: 6: Modified independent (Device/Increase time) Dynamic Standing - Balance Activities: Reaching for weighted objects;Reaching across midline Extremity/Trunk Assessment RUE Assessment RUE Assessment: Within Functional Limits LUE Assessment LUE Assessment: Within Functional Limits  See Function Navigator for Current Functional Status.  Daneen Schick Charlis Harner 09/16/2015, 5:10 PM

## 2015-09-16 NOTE — Progress Notes (Signed)
Pt with a low grade temp at the beginning of the shift. Afebrile at the time.  Continued to requires I&O cath q 4hrs for large volume ranging from 1200 ml; to 1400 ml. Pt verbalized that she would be unable to cath herself at home due to her visual impairment, and would verbalize her concerns regarding her catherization to the physician in the morning. Hypoglycemic episode at 1656 with BG of 30. Pt asymptomatic. Hypoglycemic protcol implemented, along with [facility] to include IV fluids  Pt requiring max cues regarding consummation of solution for colonoscopy procedure. Indicated that she did not feel that she could consume the amount ordered. Education provided regarding why it was necessary. Nurse to continue to provided solution q 20-30 mins.as long as pt could tolerate.

## 2015-09-16 NOTE — Progress Notes (Signed)
Physical Therapy Session Note  Patient Details  Name: [name] MRN: [id number] Date of Birth: 10-16-1965  Today's Date: 09/16/2015 PT Individual Time: 1101-1201 PT Individual Time Calculation (min): 60 min    Short Term Goals: Week 1:  PT Short Term Goal 1 (Week 1): Pt will ambulate 10 ft with RW & mod A. PT Short Term Goal 1 - Progress (Week 1): Met PT Short Term Goal 2 (Week 1): Pt will initiate stair training. PT Short Term Goal 2 - Progress (Week 1): Met PT Short Term Goal 3 (Week 1): Pt will propel w/c 150 ft with supervision.  PT Short Term Goal 3 - Progress (Week 1): Met  Skilled Therapeutic Interventions/Progress Updates:    Pt received in w/c & agreeable to treatment, denying c/o pain. Session focused on w/c mobility in controlled & home environment, transfers (car, bed<>w/c) and ambulation. Pt is currently at a mod I level with w/c mobility in controlled & home settings with BUE & intermittent use of LLE. Pt performs a modified stand pivot w/c<>bed & car with mod I overall. Pt is able to ambulate a maximum distance of 40 ft with RW & close supervision. Pt with decreased ability to push with BUE to advance LLE and demonstrates poor foot clearance. Attempted to have pt negotiate single 3 inch step with BUE support and assistance from therapist but pt unable to generate enough force to lift LLE off of ground to hop to next step. Discussed need for pt to have son pull w/c up single 1inch step into house for safety & pt verbalized understanding. At end of session pt left sitting in w/c in room.   Therapy Documentation Precautions:  Precautions Precautions: Fall Precaution Comments: R BKA (2013) Restrictions Weight Bearing Restrictions: No   See Function Navigator for Current Functional Status.   Therapy/Group: Individual Therapy  Waunita Schooner 09/16/2015, 12:27 PM

## 2015-09-16 NOTE — Progress Notes (Signed)
Physical Therapy Discharge Summary  Patient Details  Name: [name] MRN: [id number] Date of Birth: 1965/11/18  Today's Date: 09/16/2015 PT Individual Time: 1101-1201 PT Individual Time Calculation (min): 60 min     Patient has met 11 of 12 long term goals due to improved activity tolerance, improved balance, increased strength, ability to compensate for deficits and improved awareness.  Patient to discharge at a wheelchair level Modified Independent in home environment.   Patient's care partner has been trained to provide the necessary physical and cognitive assistance at discharge.  Reasons goals not met: Pt requires supervision for furniture transfers for increased safety awareness with task.  Recommendation:  Patient will benefit from ongoing skilled PT services in home health setting (if pt qualifies) to continue to advance safe functional mobility, address ongoing impairments in decreased BLE (hip & ankle LLE) strength, decreased standing balance, safety awareness, endurance deficits, prosthetic use, and minimize fall risk.  Equipment: w/c (20x18) with amputee support pad, 3-in-1 BSC  Reasons for discharge: treatment goals met  Patient/family agrees with progress made and goals achieved: Yes  PT Discharge Precautions/Restrictions Precautions Precautions: Fall Precaution Comments: R BKA (2013) Restrictions Weight Bearing Restrictions: No   Pain Pain Assessment Pain Assessment: No/denies pain  Vision/Perception   Pt reports she used to wear glasses 24/7 but vision changed prior to admission and she quit wearing glasses, as they did not help. Pt reports not changes in vision during admission to [facility].   Cognition Orientation Level: Oriented X4  Sensation Sensation Light Touch:  (decreased sensation medial aspect R knee, absent sensation lateral aspect RLE, decreased sensation LLE (absent L foot)) Proprioception:  (impaired LLE) Coordination Gross Motor  Movements are Fluid and Coordinated: Yes  Motor  Motor Motor - Skilled Clinical Observations:  (improved strength BLE)   Mobility Bed Mobility Bed Mobility: Rolling Right;Rolling Left;Supine to Sit;Sit to Supine Rolling Right: 6: Modified independent (Device/Increase time) Rolling Left: 6: Modified independent (Device/Increase time) Supine to Sit: 6: Modified independent (Device/Increase time) Sit to Supine: 6: Modified independent (Device/Increase time) Transfers Transfers: Yes Sit to Stand: 5: Supervision Stand Pivot Transfers: 5: Supervision   Locomotion  Ambulation Ambulation: Yes Ambulation/Gait Assistance: 5: Supervision Ambulation Distance (Feet): 40 Feet Assistive device: Rolling walker Ambulation/Gait Assistance Details: Verbal cues for gait pattern;Verbal cues for technique Gait Gait: Yes Gait Pattern: Poor foot clearance - left Wheelchair Mobility Wheelchair Mobility: Yes Wheelchair Assistance:  (mod I in controlled & home environement) Wheelchair Parts Management: Supervision/cueing Distance:  (150 ft)   Extremity Assessment  RLE Assessment RLE Assessment: Exceptions to [facility] RLE AROM (degrees) Overall AROM Right Lower Extremity: Within functional limits for tasks assessed RLE Strength Right Hip Flexion: 3+/5 Right Knee Flexion: 4+/5 Right Knee Extension: 4/5 LLE Assessment LLE Assessment: Exceptions to WFL LLE AROM (degrees) Overall AROM Left Lower Extremity: Within functional limits for tasks assessed LLE Strength Left Hip Flexion: 3/5 Left Knee Flexion: 4+/5 Left Knee Extension: 4+/5 Left Ankle Dorsiflexion: 3/5   See Function Navigator for Current Functional Status.  Waunita Schooner 09/16/2015, 12:18 PM

## 2015-09-16 NOTE — Progress Notes (Signed)
IP PROGRESS NOTE  Subjective: [name] has no new complaint. No bleeding.    Objective: Vital signs in last 24 hours: Blood pressure 119/60, pulse 90, temperature (!) 100.4 F (38 C), resp. rate 17, height '5\' 7"'  (1.702 m), weight 211 lb 0.5 oz (95.7 kg), SpO2 98 %.  Intake/Output from previous day: 09/11 0701 - 09/12 0700 In: 720 [P.O.:720] Out: 2650 [Urine:2650]  Physical Exam: Not performed today Lab Results:  Recent Labs  09/15/15 1208 09/16/15 0654  WBC 7.6 7.5  HGB 9.2* 8.3*  HCT 30.6* 27.5*  PLT 40* 43*   09/04/2015-PT 11.2, PTT 27.4 BMET  Recent Labs  09/15/15 1208  NA 136  K 4.5  CL 104  CO2 23  GLUCOSE 99  BUN 36*  CREATININE 1.52*  CALCIUM 9.2   Erythropoietin level on 08/31/2015-8.1  Studies/Results: Dg Chest 1 View  Result Date: 09/16/2015 CLINICAL DATA:  Fever.  Inpatient. EXAM: CHEST 1 VIEW COMPARISON:  07/07/2011 chest radiograph. FINDINGS: Stable cardiomediastinal silhouette with mild cardiomegaly. No pneumothorax. No pleural effusion. Low lung volumes. No overt pulmonary edema. Hazy left lung base opacity. IMPRESSION: 1. Stable mild cardiomegaly without overt pulmonary edema. 2. Low lung volumes with hazy left lung base opacity, favor atelectasis. Electronically Signed   By: Ilona Sorrel M.D.   On: 09/16/2015 11:01    Medications: I have reviewed the patient's current medications.  Assessment/Plan:  1. Severe anemia and thrombocytopenia-improved following multiple transfusions  Bone marrow biopsy 08/25/2015-no evidence of a hematopoietic malignancy, granulomatous disease, or metastatic carcinoma. Hypercellular marrow  Progressive thrombocytopenia beginning 09/08/2015 2. Renal failure/bilateral hydronephrosis, urinary retention-creatinine improved , good urine output  Etiology of urinary retention not established, potentially related to Diabetic neuropathy 3. Pustular/ulcerated skin lesions/decubitus ulcers-Biopsy from the left breast  and left thigh revealed epidermal hyperplasia with fibrosing granulation tissue 4. Yeast rash beneath the breasts and in the groin-improved 5. Diabetes 6. Status post right below-knee amputation in 2012 7. History of "neuropathy " 8. Coagulopathy-Resolved following vitamin K 9. Deconditioning   The CBC is not changed significantly today. She has persistent anemia and thrombocytopenia. The anemia is secondary to renal failure, chronic disease, and phlebotomy. The etiology of the thrombocytopenia remains unclear. Recommendations:  1. Daily CBC 2. Transfuse platelets for a count of less than 10,000 or bleeding 3. Resume Aranesp, begin ferrous sulfate after the colonoscopy 09/17/2015        LOS: 12 days   Betsy Coder, MD   09/16/2015, 1:55 PM

## 2015-09-17 ENCOUNTER — Encounter (HOSPITAL_COMMUNITY): Payer: Self-pay | Admitting: *Deleted

## 2015-09-17 ENCOUNTER — Inpatient Hospital Stay (HOSPITAL_COMMUNITY): Payer: Medicaid Other | Admitting: Occupational Therapy

## 2015-09-17 ENCOUNTER — Inpatient Hospital Stay (HOSPITAL_COMMUNITY): Payer: Medicaid Other | Admitting: Physical Therapy

## 2015-09-17 ENCOUNTER — Encounter (HOSPITAL_COMMUNITY)
Admission: RE | Disposition: A | Discharge status: Home Health | Payer: Self-pay | Source: Intra-hospital | Attending: Physical Medicine & Rehabilitation

## 2015-09-17 ENCOUNTER — Inpatient Hospital Stay (HOSPITAL_COMMUNITY): Payer: Medicaid Other | Admitting: Anesthesiology

## 2015-09-17 ENCOUNTER — Ambulatory Visit (HOSPITAL_COMMUNITY): Admission: RE | Admit: 2015-09-17 | Payer: Medicaid Other | Source: Ambulatory Visit | Admitting: Gastroenterology

## 2015-09-17 HISTORY — PX: ESOPHAGOGASTRODUODENOSCOPY: SHX5428

## 2015-09-17 HISTORY — PX: COLONOSCOPY: SHX5424

## 2015-09-17 LAB — GLUCOSE, CAPILLARY
Glucose-Capillary: 61 mg/dL — ABNORMAL LOW (ref 65–99)
Glucose-Capillary: 148 mg/dL — ABNORMAL HIGH (ref 65–99)
Glucose-Capillary: 67 mg/dL (ref 65–99)
Glucose-Capillary: 84 mg/dL (ref 65–99)
Glucose-Capillary: 171 mg/dL — ABNORMAL HIGH (ref 65–99)
Glucose-Capillary: 144 mg/dL — ABNORMAL HIGH (ref 65–99)

## 2015-09-17 LAB — CBC WITH DIFFERENTIAL/PLATELET
Basophils Absolute: 0 10*3/uL (ref 0.0–0.1)
Basophils Relative: 0 %
Eosinophils Absolute: 0.2 10*3/uL (ref 0.0–0.7)
Eosinophils Relative: 4 %
HCT: 31.1 % — ABNORMAL LOW (ref 36.0–46.0)
Hemoglobin: 9.7 g/dL — ABNORMAL LOW (ref 12.0–15.0)
Lymphocytes Relative: 16 %
Lymphs Abs: 0.9 10*3/uL (ref 0.7–4.0)
MCH: 27.6 pg (ref 26.0–34.0)
MCHC: 31.2 g/dL (ref 30.0–36.0)
MCV: 88.4 fL (ref 78.0–100.0)
Monocytes Absolute: 0.3 10*3/uL (ref 0.1–1.0)
Monocytes Relative: 6 %
Neutro Abs: 4.4 10*3/uL (ref 1.7–7.7)
Neutrophils Relative %: 75 %
Platelets: 60 10*3/uL — ABNORMAL LOW (ref 150–400)
RBC: 3.52 MIL/uL — ABNORMAL LOW (ref 3.87–5.11)
RDW: 15.5 % (ref 11.5–15.5)
WBC: 5.9 10*3/uL (ref 4.0–10.5)

## 2015-09-17 LAB — BASIC METABOLIC PANEL
Anion gap: 9 (ref 5–15)
BUN: 32 mg/dL — ABNORMAL HIGH (ref 6–20)
CO2: 21 mmol/L — ABNORMAL LOW (ref 22–32)
Calcium: 8.9 mg/dL (ref 8.9–10.3)
Chloride: 107 mmol/L (ref 101–111)
Creatinine, Ser: 1.33 mg/dL — ABNORMAL HIGH (ref 0.44–1.00)
GFR calc Af Amer: 54 mL/min — ABNORMAL LOW (ref >60)
GFR calc non Af Amer: 46 mL/min — ABNORMAL LOW (ref >60)
Glucose, Bld: 59 mg/dL — ABNORMAL LOW (ref 65–99)
Potassium: 4.1 mmol/L (ref 3.5–5.1)
Sodium: 137 mmol/L (ref 135–145)

## 2015-09-17 LAB — URINE CULTURE: Culture: NO GROWTH

## 2015-09-17 SURGERY — EGD (ESOPHAGOGASTRODUODENOSCOPY)
Anesthesia: Monitor Anesthesia Care

## 2015-09-17 MED ORDER — FENTANYL CITRATE (PF) 100 MCG/2ML IJ SOLN
INTRAMUSCULAR | Status: DC | PRN
  Administered 2015-09-17: 50 ug via INTRAVENOUS

## 2015-09-17 MED ORDER — MORPHINE SULFATE (PF) 2 MG/ML IV SOLN
1.0000 mg | Freq: Once | INTRAVENOUS | Status: AC
  Administered 2015-09-17: 1 mg via INTRAMUSCULAR
  Filled 2015-09-17: qty 1

## 2015-09-17 MED ORDER — PROPOFOL 500 MG/50ML IV EMUL
INTRAVENOUS | Status: DC | PRN
Start: 2015-09-17 — End: 2015-09-17
  Administered 2015-09-17: 100 ug/kg/min via INTRAVENOUS

## 2015-09-17 MED ORDER — DEXTROSE 50 % IV SOLN
INTRAVENOUS | Status: AC
  Administered 2015-09-17: 50 mL
  Filled 2015-09-17: qty 50

## 2015-09-17 MED ORDER — MORPHINE SULFATE (PF) 2 MG/ML IV SOLN
1.0000 mg | INTRAVENOUS | Status: DC | PRN
  Filled 2015-09-17: qty 1

## 2015-09-17 MED ORDER — PHENYLEPHRINE HCL 10 MG/ML IJ SOLN
INTRAMUSCULAR | Status: DC | PRN
  Administered 2015-09-17: 80 ug via INTRAVENOUS

## 2015-09-17 MED ORDER — LACTATED RINGERS IV SOLN
INTRAVENOUS | Status: DC
  Administered 2015-09-17 (×2): via INTRAVENOUS

## 2015-09-17 MED ORDER — MIDAZOLAM HCL 5 MG/5ML IJ SOLN
INTRAMUSCULAR | Status: DC | PRN
  Administered 2015-09-17: 2 mg via INTRAVENOUS

## 2015-09-17 MED ORDER — BUTAMBEN-TETRACAINE-BENZOCAINE 2-2-14 % EX AERO
INHALATION_SPRAY | CUTANEOUS | Status: DC | PRN
  Administered 2015-09-17: 2 via TOPICAL

## 2015-09-17 NOTE — Interval H&P Note (Signed)
History and Physical Interval Note:  09/17/2015 11:21 AM  Earvin HansenMichelle L Yousuf  has presented today for surgery, with the diagnosis of anemia  The various methods of treatment have been discussed with the patient and family. After consideration of risks, benefits and other options for treatment, the patient has consented to  Procedure(s): ESOPHAGOGASTRODUODENOSCOPY (EGD) (N/A) COLONOSCOPY (N/A) as a surgical intervention .  The patient's history has been reviewed, patient examined, no change in status, stable for surgery.  I have reviewed the patient's chart and labs.  Questions were answered to the patient's satisfaction.     Liese Dizdarevic C.

## 2015-09-17 NOTE — Op Note (Signed)
[facility] Patient Name: [name] Procedure Date : 09/17/2015 MRN: [id number] Attending MD: Shirley Friar , MD Date of Birth: [birth year] CSN: [id number] Age: 50 Admit Type: Inpatient Procedure:                Colonoscopy Indications:              This is the patient's first colonoscopy, Iron                            deficiency anemia Providers:                Shirley Friar, MD, Roselie Awkward, RN, Arlee Muslim Tech., Technician, Dairl Ponder, CRNA Referring MD:              Medicines:                Propofol per Anesthesia, Monitored Anesthesia Care Complications:            No immediate complications. Estimated Blood Loss:     Estimated blood loss: none. Procedure:                Pre-Anesthesia Assessment:                           - Prior to the procedure, a History and Physical                            was performed, and patient medications and                            allergies were reviewed. The patient's tolerance of                            previous anesthesia was also reviewed. The risks                            and benefits of the procedure and the sedation                            options and risks were discussed with the patient.                            All questions were answered, and informed consent                            was obtained. Prior Anticoagulants: The patient has                            taken no previous anticoagulant or antiplatelet                            agents. ASA Grade Assessment: III - A patient with  severe systemic disease. After reviewing the risks                            and benefits, the patient was deemed in                            satisfactory condition to undergo the procedure.                           - Prior to the procedure, a History and Physical                            was performed, and patient medications and                  allergies were reviewed. The patient's tolerance of                            previous anesthesia was also reviewed. The risks                            and benefits of the procedure and the sedation                            options and risks were discussed with the patient.                            All questions were answered, and informed consent                            was obtained. Prior Anticoagulants: The patient has                            taken no previous anticoagulant or antiplatelet                            agents. ASA Grade Assessment: III - A patient with                            severe systemic disease. After reviewing the risks                            and benefits, the patient was deemed in                            satisfactory condition to undergo the procedure.                           After obtaining informed consent, the colonoscope                            was passed under direct vision. Throughout the  procedure, the patient's blood pressure, pulse, and                            oxygen saturations were monitored continuously. The                            EC-[id number] ([id number] scope was introduced through                            the anus and advanced to the the cecum, identified                            by appendiceal orifice and ileocecal valve. The                            colonoscopy was performed without difficulty. The                            patient tolerated the procedure well. The quality                            of the bowel preparation was fair. The ileocecal                            valve, appendiceal orifice, and rectum were                            photographed. Scope In: 11:52:32 AM Scope Out: 12:01:31 PM Scope Withdrawal Time: 0 hours 5 minutes 36 seconds  Total Procedure Duration: 0 hours 8 minutes 59 seconds  Findings:      The perianal exam findings include a decubitus  ulceration and       non-thrombosed external hemorrhoids.      Internal hemorrhoids were found during retroflexion. The hemorrhoids       were small and Grade I (internal hemorrhoids that do not prolapse).      The retroflexed view of the distal rectum and anal verge was normal and       showed no anal or rectal abnormalities. Impression:               - Preparation of the colon was fair.                           - Decubitus ulceration and non-thrombosed external                            hemorrhoids found on perianal exam.                           - Internal hemorrhoids.                           - No specimens collected. Moderate Sedation:      N/A - MAC procedure Recommendation:           - Diabetic (ADA) diet.                           -  Repeat colonoscopy in 5 years for screening                            purposes.                           - Continue present medications. Procedure Code(s):        --- Professional ---                           [id number], Colonoscopy, flexible; diagnostic, including                            collection of specimen(s) by brushing or washing,                            when performed (separate procedure) Diagnosis Code(s):        --- Professional ---                           D50.9, Iron deficiency anemia, unspecified                           K64.0, First degree hemorrhoids                           K64.4, Residual hemorrhoidal skin tags                           L89.899, Pressure ulcer of other site, unspecified                            stage CPT copyright 2016 [employer]. All rights reserved. The codes documented in this report are preliminary and upon coder review may  be revised to meet current compliance requirements. Shirley Friar, MD 09/17/2015 12:16:34 PM This report has been signed electronically. Number of Addenda: 0

## 2015-09-17 NOTE — Transfer of Care (Signed)
Immediate Anesthesia Transfer of Care Note  Patient: Cheryl HansenMichelle L Wolf  Procedure(s) Performed: Procedure(s): ESOPHAGOGASTRODUODENOSCOPY (EGD) (N/A) COLONOSCOPY (N/A)  Patient Location: PACU and Endoscopy Unit  Anesthesia Type:MAC  Level of Consciousness: patient cooperative and responds to stimulation  Airway & Oxygen Therapy: Patient Spontanous Breathing and Patient connected to nasal cannula oxygen  Post-op Assessment: Report given to RN and Post -op Vital signs reviewed and stable  Post vital signs: Reviewed and stable  Last Vitals:  Vitals:   09/17/15 1040  BP: (!) 161/76  Resp: 16  Temp: 37.7 C    Last Pain:  Vitals:   09/17/15 1040  TempSrc: Oral         Complications: No apparent anesthesia complications

## 2015-09-17 NOTE — Progress Notes (Addendum)
[facility] PHYSICAL MEDICINE & REHABILITATION     PROGRESS NOTE  Subjective/Complaints:  Pt laying in bed this AM.  She states that her stomach feels uncomfortable and that she is thirst. She also states that she was told she is going home tomorrow vs. Today.  ROS:  +Stomach uncomfortable. Denies CP, SOB, N/V/D.  Objective: Vital Signs: Blood pressure (!) 141/67, pulse 87, temperature 97.9 F (36.6 C), temperature source Oral, resp. rate 18, height 5\' 7"  (1.702 m), weight 95.7 kg (211 lb 0.5 oz), SpO2 95 %. Dg Chest 1 View  Result Date: 09/16/2015 CLINICAL DATA:  Fever.  Inpatient. EXAM: CHEST 1 VIEW COMPARISON:  07/07/2011 chest radiograph. FINDINGS: Stable cardiomediastinal silhouette with mild cardiomegaly. No pneumothorax. No pleural effusion. Low lung volumes. No overt pulmonary edema. Hazy left lung base opacity. IMPRESSION: 1. Stable mild cardiomegaly without overt pulmonary edema. 2. Low lung volumes with hazy left lung base opacity, favor atelectasis. Electronically Signed   By: Delbert PhenixJason A Poff M.D.   On: 09/16/2015 11:01    Recent Labs  09/16/15 0654 09/17/15 0723  WBC 7.5 5.9  HGB 8.3* 9.7*  HCT 27.5* 31.1*  PLT 43* 60*    Recent Labs  09/15/15 1208 09/17/15 0723  NA 136 137  K 4.5 4.1  CL 104 107  GLUCOSE 99 59*  BUN 36* 32*  CREATININE 1.52* 1.33*  CALCIUM 9.2 8.9   CBG (last 3)   Recent Labs  09/17/15 0650 09/17/15 0745 09/17/15 0823  GLUCAP 61* 67 148*    Wt Readings from Last 3 Encounters:  09/16/15 95.7 kg (211 lb 0.5 oz)  09/03/15 101 kg (222 lb 10.6 oz)  12/14/12 90.7 kg (200 lb)    Physical Exam:  BP (!) 141/67 (BP Location: Right Arm)   Pulse 87   Temp 97.9 F (36.6 C) (Oral)   Resp 18   Ht 5\' 7"  (1.702 m)   Wt 95.7 kg (211 lb 0.5 oz)   LMP  (LMP Unknown)   SpO2 95%   BMI 33.05 kg/m  Constitutional: She appears well-developed and well-nourished. Obese  HENT: Normocephalic and atraumatic.  Eyes: Conjunctivae and EOM are normal.   Cardiovascular: Normal rate and regular rhythm.  No murmur heard. Respiratory: Effort normal and breath sounds normal.  GI: Soft. Bowel sounds are normal. She exhibits no distension. There is no tenderness.  Musculoskeletal: She exhibits edema. No tenderness. R-BKA well healed. Neurological: She is alert and oriented.  Speech clear and follows commands without difficulty.   Sensory deficits LLE, right residual limb and bilateral hands.  Motor: B/l UE: 4+/5 proximal to distal LLE: 4+/5 hip flexion, knee extension, 5/5 ankle dorsi/plantar flexion RLE: 5/5 hip flexion, knee extension   Skin: Skin is warm and dry. Numerous skin lesions. Psychiatric: She has flat mood (slightly improved). Poor recall.    Assessment/Plan: 1. Functional deficits secondary to debility which require 3+ hours per day of interdisciplinary therapy in a comprehensive inpatient rehab setting. Physiatrist is providing close team supervision and 24 hour management of active medical problems listed below. Physiatrist and rehab team continue to assess barriers to discharge/monitor patient progress toward functional and medical goals.  Function:  Bathing Bathing position   Position: Wheelchair/chair at sink  Bathing parts Body parts bathed by patient: Right arm, Chest, Left arm, Abdomen, Buttocks, Front perineal area, Right upper leg, Left upper leg, Left lower leg Body parts bathed by helper: Buttocks  Bathing assist Assist Level: More than reasonable time   Set up :  To open containers  Upper Body Dressing/Undressing Upper body dressing   What is the patient wearing?: Pull over shirt/dress     Pull over shirt/dress - Perfomed by patient: Thread/unthread left sleeve, Put head through opening, Thread/unthread right sleeve, Pull shirt over trunk          Upper body assist Assist Level: No help, No cues   Set up : To obtain clothing/put away  Lower Body Dressing/Undressing Lower body dressing   What is the  patient wearing?: Pants, Non-skid slipper socks (R BKA shrinker) Underwear - Performed by patient: Thread/unthread right underwear leg, Thread/unthread left underwear leg, Pull underwear up/down   Pants- Performed by patient: Thread/unthread right pants leg, Thread/unthread left pants leg, Pull pants up/down Pants- Performed by helper: Pull pants up/down Non-skid slipper socks- Performed by patient: Don/doff left sock (don/doff R shrinker) Non-skid slipper socks- Performed by helper: Don/doff right sock               TED Hose - Performed by helper: Don/doff left TED hose  Lower body assist Assist for lower body dressing: More than reasonable time      Toileting Toileting Toileting activity did not occur: No continent bowel/bladder event Toileting steps completed by patient: Adjust clothing prior to toileting, Performs perineal hygiene, Adjust clothing after toileting Toileting steps completed by helper: Adjust clothing after toileting, Performs perineal hygiene, Adjust clothing prior to toileting Toileting Assistive Devices: Grab bar or rail  Toileting assist Assist level: More than reasonable time   Transfers Chair/bed transfer   Chair/bed transfer method: Stand pivot Chair/bed transfer assist level: Supervision or verbal cues Chair/bed transfer assistive device: Armrests, Bedrails     Locomotion Ambulation Ambulation activity did not occur: Safety/medical concerns   Max distance: 40 ft Assist level: Supervision or verbal cues   Wheelchair   Type: Manual Max wheelchair distance: >150 ft Assist Level: No help, No cues, assistive device, takes more than reasonable amount of time (mod I in controlled environment & home setting)  Cognition Comprehension Comprehension assist level: Understands basic 90% of the time/cues < 10% of the time  Expression Expression assist level: Expresses basic 90% of the time/requires cueing < 10% of the time.  Social Interaction Social  Interaction assist level: Interacts appropriately 90% of the time - Needs monitoring or encouragement for participation or interaction.  Problem Solving Problem solving assist level: Solves basic 90% of the time/requires cueing < 10% of the time  Memory Memory assist level: Recognizes or recalls 75 - 89% of the time/requires cueing 10 - 24% of the time      Medical Problem List and Plan: 1. Deconditioning secondary to multiple medical issues including severe anemia, acute renal failure in pt with hx of right BKA  continue CIR  Prosthesis adjusted by [facility]  Likely d/c tomorrow  Will see pt for transitional care management in 1-2 weeks. 2. DVT Prophylaxis/Anticoagulation: Mechanical: Sequential compression devices 3. Pain Management: Norco prn for chronic back pain  Gabapentin 300 TID started on 9/7 4. Mood: Team to provide ego support. LCSW to follow for evaluation and support.  5. Neuropsych: This patient iscapable of making decisions on herown behalf. 6. Skin/Wound Care: Aquacel Silver for MASD gluteal cleft.  7. Fluids/Electrolytes/Nutrition:   Scheduled compazine before meals for ongoing nausea, ?gastroparesis.  10. T2DM with neuropathy, nephropathy, vasculopathy and ?gastroparesis:   Monitor BS ac/hs.   SSI for elevated BS.   Low dose amaryl 1mg    Labile this AM due to NPO 11.  CKD: Avoid NSAIDs and other nephrotoxic drugs.   Hypocalcemia managed with tums.    Encourage fluids, started IVF qhs x3 (completed)  Cr. 1.33 on 9/13 Filed Weights   09/14/15 0500 09/15/15 0704 09/16/15 0500  Weight: 91.6 kg (201 lb 14.4 oz) 94.3 kg (207 lb 14.4 oz) 95.7 kg (211 lb 0.5 oz)  12. Bilateral hydronephrosis due to chronic urinary retention/neurogenic bladder:   Failed voiding trial, plan was initially to d/c foley after IVF, however, pt being prepped for scope.  Will likely need foley at discharge  Urocholine 10mg  TID started on 9/5, increased to 25 on 9/6  Pt will likely need Urology  follow up as outpt  Repeat U/S in a few weeks 13. ABLA on Anemia of chronic disease: On IV iron for supplement--received dose of Aransep on 8/29.  Hb 9.7 on 9/13  Heme Occult +  GI consulted, plan for scope 9/13 14. Thrombocytopenia: Monitor with serial checks  Plts 60 on 9/13  Spoke to Heme/Onc Keflex d/ced.    Heme/Onc following for ?smear eval with ?steroid trial  Will need follow up as outpt  Meds reviewed, will cont to monitor 15. Constipation  Bowel reg increased on 9/1 16. HTN and Orthostasis Improving  Coreg 6.25  Cont to monitor  Elevated this AM (did not receive pain meds) 17. Folliculitis: Resolved  Right inguinal area  Keflex 9/4-9/8 then d/ced due to improvement and ?thrombocytopenia. 18. Fever: Afebrile x24 hours  Urine cx pending  Blood cx pending  CXR on 9/12, with likely atelectasis  LOS (Days) 13 A FACE TO FACE EVALUATION WAS PERFORMED  Mubashir Mallek Karis Juba 09/17/2015 8:51 AM

## 2015-09-17 NOTE — Plan of Care (Signed)
Problem: RH Wheelchair Mobility Goal: LTG Patient will propel w/c in community environment (PT) LTG: Patient will propel wheelchair in community environment, # of feet with assist (PT)  Outcome: Completed/Met Date Met: 09/17/15 150 ft; per OT report

## 2015-09-17 NOTE — Progress Notes (Signed)
Patient did not drink all of bowel prep 1/3 no consumed

## 2015-09-17 NOTE — H&P (View-Only) (Signed)
Patient ID: [name], female   DOB: 03/03/1965, 49 y.o.   MRN: [id number] [facility] Progress Note  [name] 49 y.o. 05/18/1965   Subjective: Feels good. Tolerating diet. In wheelchair in room.  Objective: Vital signs in last 24 hours: Vitals:   09/15/15 0621 09/15/15 0818  BP: 137/65 113/62  Pulse: 99 95  Resp: 18   Temp: 99.8 F (37.7 C)     Physical Exam: Gen: alert, no acute distress, in wheelchair CV: RRR Chest: CTA B Abd: soft, nontender, nondistended, +BS  Lab Results:  Recent Labs  09/15/15 1208  NA 136  K 4.5  CL 104  CO2 23  GLUCOSE 99  BUN 36*  CREATININE 1.52*  CALCIUM 9.2   No results for input(s): AST, ALT, ALKPHOS, BILITOT, PROT, ALBUMIN in the last 72 hours.  Recent Labs  09/15/15 1208  WBC 7.6  NEUTROABS 5.3  HGB 9.2*  HCT 30.6*  MCV 90.5  PLT 40*   No results for input(s): LABPROT, INR in the last 72 hours.    Assessment/Plan: Anemia - Colon/EGD planned for 09/17/15 as inpt and will prep tomorrow. Clear liquid diet tomorrow. Ok to be discharged Wed afternoon from a GI standpoint unless results of GI procedures warrant further inpt care.   Waylynn Benefiel C. 09/15/2015, 1:25 PM  Pager [phone]  If no answer or after 5 PM call [phone] 

## 2015-09-17 NOTE — Anesthesia Preprocedure Evaluation (Signed)
Anesthesia Evaluation  Patient identified by MRN, date of birth, ID band Patient awake    Reviewed: Allergy & Precautions, H&P , NPO status , Patient's Chart, lab work & pertinent test results  History of Anesthesia Complications Negative for: history of anesthetic complications  Airway Mallampati: II  TM Distance: >3 FB Neck ROM: Full    Dental  (+) Teeth Intact, Dental Advisory Given   Pulmonary neg pulmonary ROS,    Pulmonary exam normal breath sounds clear to auscultation       Cardiovascular hypertension, Pt. on medications Normal cardiovascular exam Rhythm:Regular Rate:Normal     Neuro/Psych negative neurological ROS     GI/Hepatic negative GI ROS, Neg liver ROS,   Endo/Other  diabetes, Insulin Dependent  Renal/GU CRFRenal diseasenegative Renal ROS     Musculoskeletal  (+) Arthritis ,   Abdominal   Peds  Hematology  (+) Blood dyscrasia, anemia ,   Anesthesia Other Findings   Reproductive/Obstetrics                             Anesthesia Physical  Anesthesia Plan  ASA: III  Anesthesia Plan: MAC   Post-op Pain Management:    Induction: Intravenous  Airway Management Planned: Nasal Cannula  Additional Equipment:   Intra-op Plan:   Post-operative Plan: Extubation in OR  Informed Consent: I have reviewed the patients History and Physical, chart, labs and discussed the procedure including the risks, benefits and alternatives for the proposed anesthesia with the patient or authorized representative who has indicated his/her understanding and acceptance.   Dental advisory given  Plan Discussed with: CRNA, Anesthesiologist and Surgeon  Anesthesia Plan Comments:         Anesthesia Quick Evaluation

## 2015-09-17 NOTE — Patient Care Conference (Signed)
Inpatient RehabilitationTeam Conference and Plan of Care Update Date: 09/17/2015   Time: 2:20 pm    Patient Name: [name]      Medical Record Number: [id number]  Date of Birth: 06-30-1965 Sex: Female         Room/Bed: 4W19C/4W19C-01 Payor Info: Payor: MEDICAID PENDING / Plan: MEDICAID PENDING / Product Type: *No Product type* /    Admitting Diagnosis: DEBILITY anemia  Admit Date/Time:  09/04/2015  5:06 PM Admission Comments: No comment available   Primary Diagnosis:  <principal problem not specified> Principal Problem: <principal problem not specified>  Patient Active Problem List   Diagnosis Date Noted  . Diabetes mellitus due to underlying condition with chronic kidney disease, without long-term current use of insulin (HCC)   . Fever   . Status post below knee amputation of right lower extremity (HCC)   . Diabetic peripheral neuropathy (HCC)   . Neuropathic pain   . Benign essential HTN   . Folliculitis   . Slow transit constipation   . Anemia of chronic disease   . Bilateral hydronephrosis   . Urinary retention   . CKD (chronic kidney disease)   . Uncontrolled type 2 diabetes mellitus with diabetic nephropathy, without long-term current use of insulin (HCC)   . Vasculopathy   . Debility 09/04/2015  . AKI (acute kidney injury) (HCC)   . DM type 2 with diabetic peripheral neuropathy (HCC)   . S/P BKA (below knee amputation) unilateral (HCC)   . Medical non-compliance   . Benign skin lesion of multiple sites   . Tachypnea   . Acute blood loss anemia   . Neurogenic bladder   . Occult blood positive stool   . Rectovaginal fistula   . Controlled diabetes mellitus type 2 with complications (HCC)   . Acute on chronic renal failure (HCC)   . Hydronephrosis determined by ultrasound   . Papular rash   . Uncontrolled type 2 diabetes mellitus with complication (HCC)   . Paresthesia   . Thrombocytopenia (HCC) 08/23/2015  . Pressure ulcer 08/23/2015  . Severe anemia  08/23/2015  . UTI (lower urinary tract infection) 08/23/2015  . Absolute anemia 08/23/2015  . Peripheral vascular disease, unspecified (HCC) 04/27/2012  . Unilateral complete BKA (HCC) 07/09/2011  . Gas gangrene (HCC) 07/02/2011  . Sepsis(995.91) 07/02/2011  . Acute renal failure (HCC) 07/02/2011  . Diabetes mellitus (HCC) 11/22/2010  . Diabetic foot ulcer associated with type 2 diabetes mellitus (HCC)   . Hypertension   . Arthritis   . Neuropathy (HCC)   . Intracerebral hemorrhage [facility])     Expected Discharge Date: Expected Discharge Date: 09/18/15  Team Members Present: Physician leading conference: Dr. Maryla Morrow Social Worker Present: Dossie Der, LCSW Nurse Present: Other (comment) Arlean Hopping) PT Present: Aleda Grana, Antonietta Jewel, PT OT Present: Roney Mans, OT;Other (comment) Gentry Fitz Doe-OT) SLP Present: Feliberto Gottron, SLP PPS Coordinator present : Edson Snowball, PT     Current Status/Progress Goal Weekly Team Focus  Medical   Deconditioning secondary to multiple medical issues including severe anemia, acute renal failure in pt with hx of right BKA  Improve safety, mobility, cognition, medial issues  See above   Bowel/Bladder   Continent of bowel LBM 09/18/15. Foley in place for acute urinary retention-patient to discharge with foley catheter in place  Educate patient on self-care and maintanence of foley catheter-cleaning of periarea and tubing, and emptying bag.  Allow patient to demonstrate understanding of proper care, and maintanence of foley catheter.  Swallow/Nutrition/ Hydration             ADL's   Mod I bathing, dressing, toileting, grooming, Mod I toilet transfer, mod I for sinmple meal prep and light housework at w/c level  Mod I bathing, dressing, toileting, grooming, Mod I  toilet transfer, mod I for sinmple meal prep and light housework at w/c level  education re: ADLs/self-care management, home safety awareness   Mobility    supervision for transfers, supervision/mod I w/c mobility, negotiates step to simulate home entry with w/c & assistance  supervision for transfers, supervision for standing balance, mod I w/c mobility in home & controlled environment, negotiate1 step with supervision  pt education regarding safety, home set up & d/c, w/c mobility, transfers   Communication             Safety/Cognition/ Behavioral Observations            Pain   No complaints of pain  <3  Monitor and assess for pain q shift and PRN.    Skin   Multiple areas noted with dark round lesions, inner buttocks with yellow slough- aquacel and allevyn placed, and lesion noted on labia  No new skin breakdown/ infection  Continue to monitor and assess q shift and PRN      *See Care Plan and progress notes for long and short-term goals.  Barriers to Discharge: CKD, ABLA, Neurogenic bladder, thrombocytopenia, anemia, ill-fitting BKA, urinary retention    Possible Resolutions to Barriers:  Therapies, follow labs, EGD/colonoscopy, optimize meds, prosthetic adjusted, likely foley at d/c    Discharge Planning/Teaching Needs:  Home with Mom staying for a short time for the transition home. Has equipment and follow up arranged.      Team Discussion:  Test went well-colonoscopy. Should be ready for DC tomorrow. Has reached her goals of mod/i wheelchair level and supervision transfers and for balance. Home with foley RN to show how to empty.   Revisions to Treatment Plan:  DC 9/14   Continued Need for Acute Rehabilitation Level of Care: The patient requires daily medical management by a physician with specialized training in physical medicine and rehabilitation for the following conditions: Daily direction of a multidisciplinary physical rehabilitation program to ensure safe treatment while eliciting the highest outcome that is of practical value to the patient.: Yes Daily medical management of patient stability for increased activity during  participation in an intensive rehabilitation regime.: Yes Daily analysis of laboratory values and/or radiology reports with any subsequent need for medication adjustment of medical intervention for : Neurological problems;Diabetes problems;Wound care problems;Renal problems;Urological problems;Other  Lucy ChrisDupree, Belina Mandile G 09/18/2015, 11:50 AM

## 2015-09-17 NOTE — Plan of Care (Signed)
Problem: RH Toilet Transfers Goal: LTG Patient will perform toilet transfers w/assist (OT) LTG: Patient will perform toilet transfers with assist, with/without cues using equipment (OT)  Outcome: Not Met (add Reason) Pt will require supervision for functional toilet transfers for safety.

## 2015-09-17 NOTE — Anesthesia Postprocedure Evaluation (Signed)
Anesthesia Post Note  Patient: Cheryl HansenMichelle L Wolf  Procedure(s) Performed: Procedure(s) (LRB): ESOPHAGOGASTRODUODENOSCOPY (EGD) (N/A) COLONOSCOPY (N/A)  Patient location during evaluation: PACU Anesthesia Type: MAC Level of consciousness: awake and alert Pain management: pain level controlled Vital Signs Assessment: post-procedure vital signs reviewed and stable Respiratory status: spontaneous breathing, nonlabored ventilation, respiratory function stable and patient connected to nasal cannula oxygen Cardiovascular status: stable and blood pressure returned to baseline Anesthetic complications: no    Last Vitals:  Vitals:   09/17/15 1220 09/17/15 1314  BP: (!) 137/59 136/74  Pulse: 92 93  Resp: 19 17  Temp:  37.1 C    Last Pain:  Vitals:   09/17/15 1314  TempSrc: Oral  PainSc:                  [name]

## 2015-09-17 NOTE — Progress Notes (Signed)
Hypoglycemic Event  CBG: 61  Treatment: per md none: patient is npo patient is getting 5% dextrose 1/2normal saline; d50  Symptoms: None  Follow-up CBG: Time715 CBG Result:67  Possible Reasons for Event: Inadequate meal intake  Comments/MD notified: Angulli   Susette Seminara L

## 2015-09-17 NOTE — Op Note (Signed)
[facility] Patient Name: [name] Procedure Date : 09/17/2015 MRN: [id number] Attending MD: Shirley Friar , MD Date of Birth: 11/12/1965 CSN: [id number] Age: 50 Admit Type: Inpatient Procedure:                Upper GI endoscopy Indications:              Iron deficiency anemia Providers:                Shirley Friar, MD, Roselie Awkward, RN, Arlee Muslim Tech., Technician, Dairl Ponder, CRNA Referring MD:              Medicines:                Propofol per Anesthesia, Monitored Anesthesia Care Complications:            No immediate complications. Estimated Blood Loss:     Estimated blood loss was minimal. Estimated blood                            loss was minimal. Procedure:                Pre-Anesthesia Assessment:                           - Prior to the procedure, a History and Physical                            was performed, and patient medications and                            allergies were reviewed. The patient's tolerance of                            previous anesthesia was also reviewed. The risks                            and benefits of the procedure and the sedation                            options and risks were discussed with the patient.                            All questions were answered, and informed consent                            was obtained. Prior Anticoagulants: The patient has                            taken no previous anticoagulant or antiplatelet                            agents. ASA Grade Assessment: III - A patient with  severe systemic disease. After reviewing the risks                            and benefits, the patient was deemed in                            satisfactory condition to undergo the procedure.                           After obtaining informed consent, the endoscope was                            passed under direct vision. Throughout the                           procedure, the patient's blood pressure, pulse, and                            oxygen saturations were monitored continuously. The                            EG-[id number] ([id number]) scope was introduced through the                            mouth, and advanced to the second part of duodenum.                            The upper GI endoscopy was accomplished without                            difficulty. The patient tolerated the procedure                            fairly well. Scope In: Scope Out: Findings:      The examined esophagus was normal.      The Z-line was regular and was found 42 cm from the incisors.      Patchy mild inflammation characterized by congestion (edema) and       erythema was found in the prepyloric region of the stomach. Biopsies       were taken with a cold forceps for histology. Estimated blood loss was       minimal.      The cardia and gastric fundus were normal on retroflexion.      The examined duodenum was normal. Biopsies for histology were taken with       a cold forceps for evaluation of celiac disease. Estimated blood loss       was minimal.      Localized mild mucosal changes characterized by congestion and       nodularity were found at the gastroesophageal junction. Biopsies were       taken with a cold forceps for histology. Estimated blood loss was       minimal. Impression:               - Normal esophagus.                           -  Z-line regular, 42 cm from the incisors.                           - Acute gastritis. Biopsied.                           - Normal examined duodenum. Biopsied. Moderate Sedation:      N/A - MAC procedure Recommendation:           - Await pathology results.                           - Diabetic (ADA) diet.                           - Post procedure medication orders were given. Procedure Code(s):        --- Professional ---                           [phone], Esophagogastroduodenoscopy,  flexible,                            transoral; with biopsy, single or multiple Diagnosis Code(s):        --- Professional ---                           D50.9, Iron deficiency anemia, unspecified                           K29.00, Acute gastritis without bleeding CPT copyright 2016 American Medical Association. All rights reserved. The codes documented in this report are preliminary and upon coder review may  be revised to meet current compliance requirements. Shirley Friar, MD 09/17/2015 12:13:22 PM This report has been signed electronically. Number of Addenda: 0

## 2015-09-17 NOTE — Progress Notes (Signed)
Patient to discharge 9/14 with a foley catheter in place due to urinary retention and increase output. Patient and patient's caregiver will require education on care of foley catheter. Per Allena KatzPatel, MD to place foley. Will continue to monitor patient. [place]

## 2015-09-18 ENCOUNTER — Other Ambulatory Visit: Payer: Self-pay | Admitting: *Deleted

## 2015-09-18 ENCOUNTER — Encounter (HOSPITAL_COMMUNITY): Payer: Self-pay | Admitting: Gastroenterology

## 2015-09-18 DIAGNOSIS — E0822 Diabetes mellitus due to underlying condition with diabetic chronic kidney disease: Secondary | ICD-10-CM

## 2015-09-18 DIAGNOSIS — D696 Thrombocytopenia, unspecified: Secondary | ICD-10-CM

## 2015-09-18 DIAGNOSIS — N1330 Unspecified hydronephrosis: Secondary | ICD-10-CM

## 2015-09-18 LAB — CBC WITH DIFFERENTIAL/PLATELET
Basophils Absolute: 0 10*3/uL (ref 0.0–0.1)
Basophils Relative: 0 %
Eosinophils Absolute: 0.2 10*3/uL (ref 0.0–0.7)
Eosinophils Relative: 3 %
HCT: 28.4 % — ABNORMAL LOW (ref 36.0–46.0)
Hemoglobin: 8.5 g/dL — ABNORMAL LOW (ref 12.0–15.0)
Lymphocytes Relative: 18 %
Lymphs Abs: 1.2 10*3/uL (ref 0.7–4.0)
MCH: 26.7 pg (ref 26.0–34.0)
MCHC: 29.9 g/dL — ABNORMAL LOW (ref 30.0–36.0)
MCV: 89.3 fL (ref 78.0–100.0)
Monocytes Absolute: 0.5 10*3/uL (ref 0.1–1.0)
Monocytes Relative: 7 %
Neutro Abs: 5.1 10*3/uL (ref 1.7–7.7)
Neutrophils Relative %: 73 %
Platelets: 79 10*3/uL — ABNORMAL LOW (ref 150–400)
RBC: 3.18 MIL/uL — ABNORMAL LOW (ref 3.87–5.11)
RDW: 15.3 % (ref 11.5–15.5)
WBC: 7 10*3/uL (ref 4.0–10.5)

## 2015-09-18 LAB — GLUCOSE, CAPILLARY
Glucose-Capillary: 128 mg/dL — ABNORMAL HIGH (ref 65–99)
Glucose-Capillary: 99 mg/dL (ref 65–99)

## 2015-09-18 MED ORDER — HYDROCODONE-ACETAMINOPHEN 5-325 MG PO TABS: 0.5000 | ORAL_TABLET | Freq: Every day | ORAL | 0 refills | Status: DC | PRN

## 2015-09-18 MED ORDER — FERROUS SULFATE 325 (65 FE) MG PO TABS: 325.0000 mg | ORAL_TABLET | Freq: Two times a day (BID) | ORAL | 0 refills | Status: DC

## 2015-09-18 MED ORDER — PROCHLORPERAZINE MALEATE 5 MG PO TABS: 5.0000 mg | ORAL_TABLET | Freq: Three times a day (TID) | ORAL | 0 refills | Status: DC | PRN

## 2015-09-18 MED ORDER — FAMOTIDINE 20 MG PO TABS: 20.0000 mg | ORAL_TABLET | Freq: Two times a day (BID) | ORAL | 0 refills | Status: DC

## 2015-09-18 MED ORDER — B COMPLEX-C PO TABS: 1.0000 | ORAL_TABLET | Freq: Every day | ORAL | 0 refills | Status: DC

## 2015-09-18 MED ORDER — CALCIUM CARBONATE ANTACID 500 MG PO CHEW: 2.0000 | CHEWABLE_TABLET | Freq: Three times a day (TID) | ORAL | 0 refills | Status: DC

## 2015-09-18 MED ORDER — CARVEDILOL 6.25 MG PO TABS: 6.2500 mg | ORAL_TABLET | Freq: Two times a day (BID) | ORAL | 0 refills | Status: DC

## 2015-09-18 MED ORDER — GABAPENTIN 300 MG PO CAPS: 300.0000 mg | ORAL_CAPSULE | Freq: Three times a day (TID) | ORAL | 0 refills | Status: DC

## 2015-09-18 NOTE — Progress Notes (Signed)
Social Work  Discharge Note  The overall goal for the admission was met for:   Discharge location: Yes-HOME WITH MOM FOR SHORT TIME AND HER 10 & 49 YO CHILDREN  Length of Stay: Yes-14 DAYS  Discharge activity level: Yes-MOD/I [place].  Home/community participation: Yes  Services provided included: MD, RD, PT, OT, RN, CM, TR, Pharmacy and SW  Financial Services: Other: PENDING MEDICAID  Follow-up services arranged: Home Health: [facility]-PT,RN,SW, DME: [place] and Patient/Family has no preference for HH/DME agencies  Comments (or additional information):PT'S MOM TO STAY WITH HER THROUGH THE TRANSITION HOME, BUT DOES NOT PLAN TO STAY LONG. THIS WORKER HAS CONCERNS OVER PT'S ABILITY TO FOLLOW THROUGH WITH HER MEDICAL APPOINTMENTS AND TO PROVIDE CARE TO HER CHILDREN. HAVE ASKED FOR HHSW TO SEE. HAVE FAXED SCAT APPLICATION IN AND PT HAS APPLIED FOR MEDICAID. EXPRESSED THESE CONCERNS WITH PT AND HER MOM. BOTH SEEM TO FEEL IT WILL ALL WORK OUT, DENY PT'S DEFICITS. PT SEEMS TO UNDERSTAND THE RISKS INVOLVED IF SHE DOES NOT FOLLOW UP WITH MD'S APPOINTMENTS OR TAKE HER MEDICATIONS. PT IS CONSIDERED COMPETENT AND ABLE TO MAKE HER OWN DECISIONS. WILL CONNECT WITH AVAILABLE RESOURCES AND HOPE FOR THE BEST. VERY LIKELY SHE WILL BE RE-ADMITTED  Patient/Family verbalized understanding of follow-up arrangements: Yes  Individual responsible for coordination of the follow-up plan: SELF & MOM-PATSY  Confirmed correct DME delivered: Elease Hashimoto 09/18/2015    Elease Hashimoto

## 2015-09-18 NOTE — Plan of Care (Signed)
Problem: RH BLADDER ELIMINATION Goal: RH STG MANAGE BLADDER WITH ASSISTANCE STG Manage Bladder With max Assistance   Outcome: Not Met (add Reason) Foley catheter placed  Comments: Foley catheter was placed due to urinary retention

## 2015-09-18 NOTE — Progress Notes (Signed)
[place] PHYSICAL MEDICINE & REHABILITATION     PROGRESS NOTE  Subjective/Complaints:  Pt laying in bed this AM.  She states she feels better than yesterday, but is still apprehensive about going home.  She has questions about her discharge medications.   ROS:  Denies CP, SOB, N/V/D.  Objective: Vital Signs: Blood pressure 131/70, pulse 89, temperature 99.1 F (37.3 C), temperature source Oral, resp. rate 18, height 5\' 7"  (1.702 m), weight 95.7 kg (211 lb 0.5 oz), SpO2 100 %. Dg Chest 1 View  Result Date: 09/16/2015 CLINICAL DATA:  Fever.  Inpatient. EXAM: CHEST 1 VIEW COMPARISON:  07/07/2011 chest radiograph. FINDINGS: Stable cardiomediastinal silhouette with mild cardiomegaly. No pneumothorax. No pleural effusion. Low lung volumes. No overt pulmonary edema. Hazy left lung base opacity. IMPRESSION: 1. Stable mild cardiomegaly without overt pulmonary edema. 2. Low lung volumes with hazy left lung base opacity, favor atelectasis. Electronically Signed   By: Delbert Phenix M.D.   On: 09/16/2015 11:01    Recent Labs  09/17/15 0723 09/18/15 0557  WBC 5.9 7.0  HGB 9.7* 8.5*  HCT 31.1* 28.4*  PLT 60* 79*    Recent Labs  09/15/15 1208 09/17/15 0723  NA 136 137  K 4.5 4.1  CL 104 107  GLUCOSE 99 59*  BUN 36* 32*  CREATININE 1.52* 1.33*  CALCIUM 9.2 8.9   CBG (last 3)   Recent Labs  09/17/15 1630 09/17/15 2115 09/18/15 0650  GLUCAP 171* 144* 128*    Wt Readings from Last 3 Encounters:  09/16/15 95.7 kg (211 lb 0.5 oz)  09/03/15 101 kg (222 lb 10.6 oz)  12/14/12 90.7 kg (200 lb)    Physical Exam:  BP 131/70 (BP Location: Right Arm)   Pulse 89   Temp 99.1 F (37.3 C) (Oral)   Resp 18   Ht 5\' 7"  (1.702 m)   Wt 95.7 kg (211 lb 0.5 oz)   LMP  (LMP Unknown)   SpO2 100%   BMI 33.05 kg/m  Constitutional: She appears well-developed and well-nourished. Obese  HENT: Normocephalic and atraumatic.  Eyes: Conjunctivae and EOM are normal.  Cardiovascular: Normal rate and  regular rhythm.  No murmur heard. Respiratory: Effort normal and breath sounds normal.  GI: Soft. Bowel sounds are normal. She exhibits no distension. There is no tenderness.  Musculoskeletal: She exhibits edema. No tenderness. R-BKA well healed. Neurological: She is alert and oriented.  Speech clear and follows commands without difficulty.   Sensory deficits LLE, right residual limb and bilateral hands.  Motor: B/l UE: 4+-5/5 proximal to distal LLE: 4+-5/5 hip flexion, knee extension, 5/5 ankle dorsi/plantar flexion RLE: 5/5 hip flexion, knee extension   Skin: Skin is warm and dry. Numerous skin lesions. Psychiatric: She has flat mood (slightly improved). Poor recall.    Assessment/Plan: 1. Functional deficits secondary to debility which require 3+ hours per day of interdisciplinary therapy in a comprehensive inpatient rehab setting. Physiatrist is providing close team supervision and 24 hour management of active medical problems listed below. Physiatrist and rehab team continue to assess barriers to discharge/monitor patient progress toward functional and medical goals.  Function:  Bathing Bathing position   Position: Wheelchair/chair at sink  Bathing parts Body parts bathed by patient: Right arm, Chest, Left arm, Abdomen, Buttocks, Front perineal area, Right upper leg, Left upper leg, Left lower leg Body parts bathed by helper: Buttocks  Bathing assist Assist Level: More than reasonable time   Set up : To open containers  Upper Body  Dressing/Undressing Upper body dressing   What is the patient wearing?: Pull over shirt/dress     Pull over shirt/dress - Perfomed by patient: Thread/unthread left sleeve, Put head through opening, Thread/unthread right sleeve, Pull shirt over trunk          Upper body assist Assist Level: No help, No cues   Set up : To obtain clothing/put away  Lower Body Dressing/Undressing Lower body dressing   What is the patient wearing?: Pants,  Non-skid slipper socks (R BKA shrinker) Underwear - Performed by patient: Thread/unthread right underwear leg, Thread/unthread left underwear leg, Pull underwear up/down   Pants- Performed by patient: Thread/unthread right pants leg, Thread/unthread left pants leg, Pull pants up/down Pants- Performed by helper: Pull pants up/down Non-skid slipper socks- Performed by patient: Don/doff left sock (don/doff R shrinker) Non-skid slipper socks- Performed by helper: Don/doff right sock               TED Hose - Performed by helper: Don/doff left TED hose  Lower body assist Assist for lower body dressing: More than reasonable time      Toileting Toileting Toileting activity did not occur: No continent bowel/bladder event Toileting steps completed by patient: Adjust clothing prior to toileting, Performs perineal hygiene, Adjust clothing after toileting Toileting steps completed by helper: Adjust clothing after toileting, Performs perineal hygiene, Adjust clothing prior to toileting Toileting Assistive Devices: Grab bar or rail  Toileting assist Assist level: More than reasonable time   Transfers Chair/bed transfer   Chair/bed transfer method: Stand pivot Chair/bed transfer assist level: Supervision or verbal cues Chair/bed transfer assistive device: Armrests, Bedrails     Locomotion Ambulation Ambulation activity did not occur: Safety/medical concerns   Max distance: 40 ft Assist level: Supervision or verbal cues   Wheelchair   Type: Manual Max wheelchair distance: >150 ft Assist Level: No help, No cues, assistive device, takes more than reasonable amount of time (mod I in controlled environment & home setting)  Cognition Comprehension Comprehension assist level: Understands basic 90% of the time/cues < 10% of the time  Expression Expression assist level: Expresses basic 90% of the time/requires cueing < 10% of the time.  Social Interaction Social Interaction assist level:  Interacts appropriately 90% of the time - Needs monitoring or encouragement for participation or interaction.  Problem Solving Problem solving assist level: Solves basic 90% of the time/requires cueing < 10% of the time  Memory Memory assist level: Recognizes or recalls 75 - 89% of the time/requires cueing 10 - 24% of the time      Medical Problem List and Plan: 1. Deconditioning secondary to multiple medical issues including severe anemia, acute renal failure in pt with hx of right BKA  continue CIR  Prosthesis adjusted by [facility]  D/c today.  Will see pt for transitional care management in 1-2 weeks.  Unfortunately, due to pt's cognition she is at high risk for complications.  Pt refusing SNF, however.   2. DVT Prophylaxis/Anticoagulation: Mechanical: Sequential compression devices 3. Pain Management: Norco prn for chronic back pain  Gabapentin 300 TID started on 9/7 4. Mood: Team to provide ego support. LCSW to follow for evaluation and support.  5. Neuropsych: This patient iscapable of making decisions on herown behalf. 6. Skin/Wound Care: Aquacel Silver for MASD gluteal cleft.  7. Fluids/Electrolytes/Nutrition: Improved  Scheduled compazine before meals for ongoing nausea, ?gastroparesis.  10. T2DM with neuropathy, nephropathy, vasculopathy and ?gastroparesis:   Monitor BS ac/hs.   SSI for elevated BS.  Low dose amaryl 1mg , will not d/c on this 11. CKD: Avoid NSAIDs and other nephrotoxic drugs.   Hypocalcemia managed with tums.    Encourage fluids, started IVF qhs x3 (completed)  Cr. 1.33 on 9/13 Filed Weights   09/14/15 0500 09/15/15 0704 09/16/15 0500  Weight: 91.6 kg (201 lb 14.4 oz) 94.3 kg (207 lb 14.4 oz) 95.7 kg (211 lb 0.5 oz)  12. Bilateral hydronephrosis due to chronic urinary retention/neurogenic bladder:   Failed voiding trial, plan was initially to d/c foley after IVF, however, then pt being prepped for scope.    Pt continues to have high volume outpt and  will be d/ced with foley   Urocholine 10mg  TID started on 9/5, increased to 25 on 9/6  Pt will need Urology follow up as outpt  Repeat U/S in a few weeks 13. ABLA on Anemia of chronic disease: On IV iron for supplement--received dose of Aransep on 8/29.  Hb 8.5 on 9/14  Heme Occult +  GI consulted, scoped 9/13, relatively unremarkable, bx pending 14. Thrombocytopenia: Monitor with serial checks  Plts 79 on 9/14  Spoke to Heme/Onc Keflex d/ced.    Heme/Onc following for ?smear eval with ?steroid trial, however counts improving  Will need follow up as outpt  Meds reviewed, will cont to monitor 15. Constipation  Bowel reg increased on 9/1 16. HTN and Orthostasis Improving  Coreg 6.25  Cont to monitor 17. Folliculitis: Resolved  Right inguinal area  Keflex 9/4-9/8 then d/ced due to improvement and ?thrombocytopenia. 18. Fever x1: Afebrile x48 hours  Urine cx NG  Blood cx NGTD  CXR on 9/12, with likely atelectasis  LOS (Days) 14 A FACE TO FACE EVALUATION WAS PERFORMED  Ankit Karis Juba 09/18/2015 8:30 AM

## 2015-09-18 NOTE — Progress Notes (Signed)
Patient educated on foley catheter care- cleaning catheter tubing, emptying foley bag, and changing to a leg bag. Patient and patient's mother demonstrated understanding. Discharge information given to patient by Marissa NestlePam Love, PA, and all questions answered. Patient assisted to car via wheelchair with nurse tech assistance.[place]

## 2015-09-18 NOTE — Discharge Instructions (Signed)
Inpatient Rehab Discharge Instructions  [name] Discharge date and time:  09/18/15  Activities/Precautions/ Functional Status: Activity: no lifting, driving, or strenuous exercise  till cleared by MD Diet: diabetic diet Wound Care:  Warm moist compresses to lesions if they get inflamed. Use gold bond powder between skin folds to avoid chapping.    Functional status:  ___ No restrictions     ___ Walk up steps independently ___ 24/7 supervision/assistance   ___ Walk up steps with assistance _X__ Intermittent supervision/assistance  ___ Bathe/dress independently ___ Walk with walker     _X__ Bathe/dress with assistance ___ Walk Independently    ___ Shower independently ___ Walk with assistance    ___ Shower with assistance _X__ No alcohol     ___ Return to work/school ________   Special Instructions: 1. You have to keep your MD appointments. 2. Keep foley off the floor and perform foley care as educated. 3. You have to eat consistent meals three times a day or 5 small meals daily. 4.  Contact MD if your nausea/vomiting recurs or if unable to take any oral intake.    COMMUNITY REFERRALS UPON DISCHARGE:    Home Health:   PT, RN, SW   Agency:[facility] [phone]   Date of last service:09/17/2015  Medical Equipment/Items Ordered:WHEELCHAIR & BEDSIDE COMMODE  Agency/Supplier:[facility]    [phone] Other:HAS APPLIED FOR MEDICAID, SCAT AND MATCH PROGRAM GIVEN TO PT FOR ASSISTANCE WITH MEDICATIONS  GENERAL COMMUNITY RESOURCES FOR PATIENT/FAMILY: Support Groups:[employer] SUPPORT GROUP  EVERY SECOND Tuesday @7 -8:30 PM [facility] QUESTIONS CONTACT ROBIN [phone]    My questions have been answered and I understand these instructions. I will adhere to these goals and the provided educational materials after my discharge from the hospital.  Patient/Caregiver Signature _______________________________ Date __________  Clinician  Signature _______________________________________ Date __________  Please bring this form and your medication list with you to all your follow-up doctor's appointments.

## 2015-09-18 NOTE — Plan of Care (Signed)
Problem: RH BLADDER ELIMINATION Goal: RH STG MANAGE BLADDER WITH ASSISTANCE STG Manage Bladder With max Assistance   Outcome: Not Met (add Reason) Acute urinary retention- foley placed patient to be discharged with foley in place

## 2015-09-18 NOTE — Discharge Summary (Signed)
Physician Discharge Summary  Patient ID: [name] MRN: [id number] DOB/AGE: 1965/03/06 50 y.o.  Admit date: 09/04/2015 Discharge date: 09/18/2015  Discharge Diagnoses:  Principal Problem:   Debility Active Problems:   Slow transit constipation   Anemia of chronic disease   Bilateral hydronephrosis   Urinary retention   CKD (chronic kidney disease)   Vasculopathy   Benign essential HTN   Folliculitis   Diabetic peripheral neuropathy (HCC)   Neuropathic pain   Status post below knee amputation of right lower extremity (HCC)   Diabetes mellitus due to underlying condition with chronic kidney disease, without long-term current use of insulin (HCC)   Discharged Condition: Stable    Labs:  Basic Metabolic Panel: BMP Latest Ref Rng & Units 09/17/2015 09/15/2015 09/11/2015  Glucose 65 - 99 mg/dL 59(L) 99 100(H)  BUN 6 - 20 mg/dL 32(H) 36(H) 27(H)  Creatinine 0.44 - 1.00 mg/dL 1.33(H) 1.52(H) 1.65(H)  Sodium 135 - 145 mmol/L 137 136 141  Potassium 3.5 - 5.1 mmol/L 4.1 4.5 4.1  Chloride 101 - 111 mmol/L 107 104 110  CO2 22 - 32 mmol/L 21(L) 23 23  Calcium 8.9 - 10.3 mg/dL 8.9 9.2 8.7(L)    CBC:  Recent Labs Lab 09/16/15 0654 09/17/15 0723 09/18/15 0557  WBC 7.5 5.9 7.0  NEUTROABS 5.5 4.4 5.1  HGB 8.3* 9.7* 8.5*  HCT 27.5* 31.1* 28.4*  MCV 90.2 88.4 89.3  PLT 43* 60* 79*    CBG:  Recent Labs Lab 09/17/15 1311 09/17/15 1630 09/17/15 2115 09/18/15 0650 09/18/15 1137  GLUCAP 84 171* 144* 128* 99    Brief HPI:   [name] a 50 y.o.femalewith history of T2DM with neuropathy, ICH s/p left crani as a teenager?, diabetic foot ulcer s/p R-BKA 7/13, medication non-compliance for past 2 years who was admitted on 08/23/15 with 2 month history of N/V with decrease in po intake, malaise, inability to walk, dyspnea and numbness of her hands. She was found to have multiple skin lesions with metabolic acidosis, severe normocytic-normochromic anemia--hgb 4.8 and  thrombocytopenia - plt 10. She was found to have acute renal failure and renal ultrasound showed normal kidneys with distended bladder and bilateral hydronephrosis. She was transfused with 2 units PRBC and one unit platelets and started on Levaquin for UTI.  Dr [name] consulted for work up and bone marrow biopsy was negative and skin bx  revealed epidermal hyperplasia with fibrosing granulation tissue. Heme/Onc recommends erythropoietin/iron for anemia and to transfuse as needed for platelets less than 10K.  Dr. Justin Mend felt that ARF due to bilateral hydronephrosis/BOO and foley was placed with improvement in UOP and downward trend in SCr.  Dr.Herrick evaluated patient and felt that urinary retention has been chronic leading to ureteral reflux and hydronephrosis. Follow up renal U/S showed improvement and he did not feel stents needed at this time.  Patient was noted to be debilitated due to multiple medical issues  and CIR was recommended for follow up therapy   Hospital Course: [name] was admitted to rehab 09/04/2015 for inpatient therapies to consist of PT and OT at least three hours five days a week. Past admission physiatrist, therapy team and rehab RN have worked together to provide customized collaborative inpatient rehab.  She continued to have issues with intermittent nausea with variable intake. Nutritional supplements were offered between meals. Nephrology followed of input and signed off as renal issues stabilized with new baseline Scr and resolution of metabolic acidosis as well as resolution of  hypocalemia with supplement. She has been set up to follow up with Dr. Hollie Salk 9/20.  Orthostatic hypotension has resolved and she is tolerating low dose coreg for BP management.      [facility] was consulted for input due to heme positive stools.  Endoscopy done revealing mild gastritis without signs of bleeding and colonoscopy revealed hemorrhoids as well as healed rectal tags.  Pepcid was added  to help with GI symptoms and she has been educated on importance of adhering to bowel program. She has failed two voiding trials and continues to have high volumes UOP. Foley was replaced on 9/13 and patient to follow up with GU on 9/28 for further workup. She was loaded with IV iron but continued to be anemic with downward trend in H/H as well as platelets.   Dr [name] was reconsulted due to drop in platelets to 40 as well as ongoing anemia.  He recommended monitoring with serial CBC which showed slow rise in platelets to 79.  She did receive a dose of aranesp 60 mcg on 9/12 and was advised to follow up with Hem/Onc after discharge.  She has had complaints of pain in BLE due to neuropathy and was started on gabapentin tid on 9/7. She continues to use low dose norco once a day for severe pain. She was advised to avoid use of  Aleve and other NSAIDs after discharge.  Her blood sugars were monitored on ac/hs basis and low dose Amaryl was added due to upward trend in BS with increase in intake. She developed hypoglycemic episodes therefore this was discontinued. She was advised to check BS bid for now as her mother has a new machine at home. She has been referred to endocrinology for workup and input on medication regimen.  Patient's mood and endurance levels have greatly improved during her rehab stay. She is modified independent at wheelchair level in controlled setting. She will continue to receive follow up HHPT, HHRN and HHSW by [facility] after discharge.    Rehab course: During patient's stay in rehab weekly team conferences were held to monitor patient's progress, set goals and discuss barriers to discharge. At admission, patient required mod assist with ADL tasks and max assist with mobility. She  has had improvement in activity tolerance, balance, postural control, as well as ability to compensate for deficits. She is able to complete ADL tasks at modified independent level. She is modified  independent for stand pivot transfers/car transfers and is able to ambulate 25' with RW and close supervision.    Disposition: 01-Home or Self Care  Diet: Diabetic diet.   Special Instructions: 1. Need to keep/show up for your MD appointments. Has been set up with [facility].  2. Keep foley off the floor and perform foley care as educated. 3. Eat consistent meals three times a day or 5 small meals daily. 4.  Contact MD if nausea/vomiting recurs or if unable to take any oral intake.    Discharge Instructions    Ambulatory referral to Endocrinology    Complete by:  As directed    Diabetic patient who has not had any follow up for years--See hospitalization notes/work up from 08/23/15 to 09/18/15. Has had hyper/hypoglycemic episodes--needs endocrine work up. Please contact patient after 09/21/15       Medication List    STOP taking these medications   aspirin 81 MG EC tablet   GOODY HEADACHE PO     TAKE these medications   B-complex with vitamin C  tablet Take 1 tablet by mouth daily. Start taking on:  09/19/2015   calcium carbonate 500 MG chewable tablet Commonly known as:  TUMS - dosed in mg elemental calcium Chew 2 tablets (400 mg of elemental calcium total) by mouth 3 (three) times daily.   carvedilol 6.25 MG tablet Commonly known as:  COREG Take 1 tablet (6.25 mg total) by mouth 2 (two) times daily with a meal.   CLEAR EYES OP Place 1 drop into both eyes daily as needed (dry eyes/itching).   famotidine 20 MG tablet Commonly known as:  PEPCID Take 1 tablet (20 mg total) by mouth 2 (two) times daily.   ferrous sulfate 325 (65 FE) MG tablet Take 1 tablet (325 mg total) by mouth 2 (two) times daily with a meal.   gabapentin 300 MG capsule Commonly known as:  NEURONTIN Take 1 capsule (300 mg total) by mouth 3 (three) times daily.   HYDROcodone-acetaminophen 5-325 MG tablet Commonly known as:  NORCO/VICODIN Take 0.5-1 tablets by mouth daily as needed for severe pain.    ICY HOT EX Apply 1 application topically 2 (two) times daily as needed (pain).   prochlorperazine 5 MG tablet Commonly known as:  COMPAZINE Take 1-2 tablets (5-10 mg total) by mouth every 8 (eight) hours as needed for nausea.      Follow-up Information    [facility] Follow up on 10/14/2015.   Why:  Appointment @ 9:00 AM Contact information: [place] [address]       Ankit Lorie Phenix, MD .   Specialty:  Physical Medicine and Rehabilitation Why:  office will call you for follow up appointment Contact information: [address] [place] [address] [phone]        Madelon Lips, MD Follow up on 09/24/2015.   Specialty:  Nephrology Why:  Appointment at 8:45 am--follow up on kidneys Contact information: [place] [address] [phone]        Missy Sabins, MD. Call today.   Specialty:  Gastroenterology Why:  for follow up on stomach/nausea issues Contact information: [address] [place] [address] [phone]        Betsy Coder, MD .   Specialty:  Oncology Contact information: [place] [address] [phone]        Renato Shin, MD .   Specialty:  Endocrinology Why:  Office will call you with follow up appointment Contact information: [address] [place] [address] [phone]        Burnice Logan, MD Follow up on 10/02/2015.   Specialty:  General Surgery Why:  Resident Urology clinic--Appointment at 1 pm  Contact information: [place] [address] [phone]           Signed: Bary Leriche 09/18/2015, 6:04 PM

## 2015-09-21 LAB — CULTURE, BLOOD (ROUTINE X 2)
Culture: NO GROWTH
Culture: NO GROWTH

## 2015-09-22 ENCOUNTER — Telehealth: Payer: Self-pay | Admitting: Oncology

## 2015-09-22 ENCOUNTER — Encounter: Payer: Self-pay | Admitting: Oncology

## 2015-09-22 NOTE — Telephone Encounter (Signed)
Appt scheduled w/Dr. Truett PernaSherrill for 9/26 at 9am. A vm was left to make the pt aware of the appt date and time. Letter also mailed.

## 2015-09-24 ENCOUNTER — Other Ambulatory Visit: Payer: Self-pay

## 2015-09-24 ENCOUNTER — Emergency Department (HOSPITAL_COMMUNITY): Payer: Medicaid Other

## 2015-09-24 ENCOUNTER — Inpatient Hospital Stay (HOSPITAL_COMMUNITY)
Admission: EM | Admit: 2015-09-24 | Discharge: 2015-09-27 | DRG: 683 | Disposition: A | Discharge status: Home Health | Payer: Medicaid Other | Attending: Internal Medicine | Admitting: Internal Medicine

## 2015-09-24 ENCOUNTER — Encounter (HOSPITAL_COMMUNITY): Payer: Self-pay

## 2015-09-24 DIAGNOSIS — D631 Anemia in chronic kidney disease: Secondary | ICD-10-CM | POA: Diagnosis present

## 2015-09-24 DIAGNOSIS — Z8673 Personal history of transient ischemic attack (TIA), and cerebral infarction without residual deficits: Secondary | ICD-10-CM

## 2015-09-24 DIAGNOSIS — I129 Hypertensive chronic kidney disease with stage 1 through stage 4 chronic kidney disease, or unspecified chronic kidney disease: Secondary | ICD-10-CM | POA: Diagnosis present

## 2015-09-24 DIAGNOSIS — L98429 Non-pressure chronic ulcer of back with unspecified severity: Secondary | ICD-10-CM | POA: Diagnosis present

## 2015-09-24 DIAGNOSIS — E876 Hypokalemia: Secondary | ICD-10-CM | POA: Diagnosis not present

## 2015-09-24 DIAGNOSIS — Z79899 Other long term (current) drug therapy: Secondary | ICD-10-CM

## 2015-09-24 DIAGNOSIS — I4581 Long QT syndrome: Secondary | ICD-10-CM | POA: Diagnosis not present

## 2015-09-24 DIAGNOSIS — N390 Urinary tract infection, site not specified: Secondary | ICD-10-CM | POA: Diagnosis present

## 2015-09-24 DIAGNOSIS — N183 Chronic kidney disease, stage 3 unspecified: Secondary | ICD-10-CM

## 2015-09-24 DIAGNOSIS — G629 Polyneuropathy, unspecified: Secondary | ICD-10-CM

## 2015-09-24 DIAGNOSIS — N1330 Unspecified hydronephrosis: Secondary | ICD-10-CM | POA: Diagnosis present

## 2015-09-24 DIAGNOSIS — Z89511 Acquired absence of right leg below knee: Secondary | ICD-10-CM

## 2015-09-24 DIAGNOSIS — I619 Nontraumatic intracerebral hemorrhage, unspecified: Secondary | ICD-10-CM | POA: Diagnosis present

## 2015-09-24 DIAGNOSIS — R9431 Abnormal electrocardiogram [ECG] [EKG]: Secondary | ICD-10-CM | POA: Diagnosis present

## 2015-09-24 DIAGNOSIS — N170 Acute kidney failure with tubular necrosis: Principal | ICD-10-CM | POA: Diagnosis present

## 2015-09-24 DIAGNOSIS — I10 Essential (primary) hypertension: Secondary | ICD-10-CM | POA: Diagnosis present

## 2015-09-24 DIAGNOSIS — I959 Hypotension, unspecified: Secondary | ICD-10-CM | POA: Diagnosis present

## 2015-09-24 DIAGNOSIS — E860 Dehydration: Secondary | ICD-10-CM | POA: Diagnosis present

## 2015-09-24 DIAGNOSIS — B952 Enterococcus as the cause of diseases classified elsewhere: Secondary | ICD-10-CM | POA: Diagnosis present

## 2015-09-24 DIAGNOSIS — Z89519 Acquired absence of unspecified leg below knee: Secondary | ICD-10-CM

## 2015-09-24 DIAGNOSIS — E1142 Type 2 diabetes mellitus with diabetic polyneuropathy: Secondary | ICD-10-CM | POA: Diagnosis present

## 2015-09-24 DIAGNOSIS — D696 Thrombocytopenia, unspecified: Secondary | ICD-10-CM | POA: Diagnosis present

## 2015-09-24 DIAGNOSIS — R197 Diarrhea, unspecified: Secondary | ICD-10-CM | POA: Diagnosis not present

## 2015-09-24 DIAGNOSIS — E119 Type 2 diabetes mellitus without complications: Secondary | ICD-10-CM

## 2015-09-24 DIAGNOSIS — E1122 Type 2 diabetes mellitus with diabetic chronic kidney disease: Secondary | ICD-10-CM | POA: Diagnosis present

## 2015-09-24 DIAGNOSIS — R531 Weakness: Secondary | ICD-10-CM

## 2015-09-24 DIAGNOSIS — N179 Acute kidney failure, unspecified: Secondary | ICD-10-CM

## 2015-09-24 HISTORY — DX: Anemia, unspecified: D64.9

## 2015-09-24 LAB — BASIC METABOLIC PANEL
Anion gap: 10 (ref 5–15)
BUN: 25 mg/dL — ABNORMAL HIGH (ref 6–20)
CO2: 24 mmol/L (ref 22–32)
Calcium: 9.1 mg/dL (ref 8.9–10.3)
Chloride: 98 mmol/L — ABNORMAL LOW (ref 101–111)
Creatinine, Ser: 2.26 mg/dL — ABNORMAL HIGH (ref 0.44–1.00)
GFR calc Af Amer: 28 mL/min — ABNORMAL LOW (ref >60)
GFR calc non Af Amer: 24 mL/min — ABNORMAL LOW (ref >60)
Glucose, Bld: 172 mg/dL — ABNORMAL HIGH (ref 65–99)
Potassium: 3.8 mmol/L (ref 3.5–5.1)
Sodium: 132 mmol/L — ABNORMAL LOW (ref 135–145)

## 2015-09-24 LAB — URINE MICROSCOPIC-ADD ON

## 2015-09-24 LAB — URINALYSIS, ROUTINE W REFLEX MICROSCOPIC
Glucose, UA: NEGATIVE mg/dL
Ketones, ur: NEGATIVE mg/dL
Nitrite: POSITIVE — AB
Protein, ur: >300 mg/dL — AB
Specific Gravity, Urine: 1.027 (ref 1.005–1.030)
pH: 6 (ref 5.0–8.0)

## 2015-09-24 LAB — URINALYSIS W MICROSCOPIC (NOT AT ARMC)
Glucose, UA: NEGATIVE mg/dL
Ketones, ur: NEGATIVE mg/dL
Nitrite: NEGATIVE
Protein, ur: >300 mg/dL — AB
Specific Gravity, Urine: 1.018 (ref 1.005–1.030)
pH: 5 (ref 5.0–8.0)

## 2015-09-24 LAB — I-STAT TROPONIN, ED: Troponin i, poc: 0.01 ng/mL (ref 0.00–0.08)

## 2015-09-24 LAB — CBC
HCT: 32.7 % — ABNORMAL LOW (ref 36.0–46.0)
Hemoglobin: 10.5 g/dL — ABNORMAL LOW (ref 12.0–15.0)
MCH: 27.2 pg (ref 26.0–34.0)
MCHC: 32.1 g/dL (ref 30.0–36.0)
MCV: 84.7 fL (ref 78.0–100.0)
Platelets: 195 10*3/uL (ref 150–400)
RBC: 3.86 MIL/uL — ABNORMAL LOW (ref 3.87–5.11)
RDW: 14.7 % (ref 11.5–15.5)
WBC: 11.2 10*3/uL — ABNORMAL HIGH (ref 4.0–10.5)

## 2015-09-24 LAB — CBG MONITORING, ED: Glucose-Capillary: 168 mg/dL — ABNORMAL HIGH (ref 65–99)

## 2015-09-24 LAB — I-STAT CG4 LACTIC ACID, ED: Lactic Acid, Venous: 1.57 mmol/L (ref 0.5–1.9)

## 2015-09-24 MED ORDER — FERROUS SULFATE 325 (65 FE) MG PO TABS
325.0000 mg | ORAL_TABLET | Freq: Two times a day (BID) | ORAL | Status: DC
  Administered 2015-09-25 – 2015-09-27 (×5): 325 mg via ORAL
  Filled 2015-09-24 (×5): qty 1

## 2015-09-24 MED ORDER — HYDROCODONE-ACETAMINOPHEN 5-325 MG PO TABS
0.5000 | ORAL_TABLET | Freq: Every day | ORAL | Status: DC | PRN
  Administered 2015-09-25: 1 via ORAL
  Filled 2015-09-24: qty 1

## 2015-09-24 MED ORDER — FAMOTIDINE 20 MG PO TABS
20.0000 mg | ORAL_TABLET | Freq: Two times a day (BID) | ORAL | Status: DC
  Administered 2015-09-24 – 2015-09-27 (×6): 20 mg via ORAL
  Filled 2015-09-24 (×6): qty 1

## 2015-09-24 MED ORDER — ACETAMINOPHEN 325 MG PO TABS
650.0000 mg | ORAL_TABLET | Freq: Four times a day (QID) | ORAL | Status: DC | PRN
Start: 2015-09-24 — End: 2015-09-27
  Filled 2015-09-24: qty 2

## 2015-09-24 MED ORDER — HEPARIN SODIUM (PORCINE) 5000 UNIT/ML IJ SOLN
5000.0000 [IU] | Freq: Three times a day (TID) | INTRAMUSCULAR | Status: DC
  Administered 2015-09-24 – 2015-09-27 (×8): 5000 [IU] via SUBCUTANEOUS
  Filled 2015-09-24 (×8): qty 1

## 2015-09-24 MED ORDER — ACETAMINOPHEN 650 MG RE SUPP: 650.0000 mg | Freq: Four times a day (QID) | RECTAL | Status: DC | PRN

## 2015-09-24 MED ORDER — PROCHLORPERAZINE MALEATE 5 MG PO TABS
5.0000 mg | ORAL_TABLET | Freq: Three times a day (TID) | ORAL | Status: DC | PRN
  Administered 2015-09-26: 5 mg via ORAL
  Filled 2015-09-24 (×3): qty 2

## 2015-09-24 MED ORDER — POLYETHYLENE GLYCOL 3350 17 G PO PACK: 17.0000 g | PACK | Freq: Every day | ORAL | Status: DC | PRN

## 2015-09-24 MED ORDER — SODIUM CHLORIDE 0.9 % IV BOLUS (SEPSIS)
1000.0000 mL | Freq: Once | INTRAVENOUS | Status: AC
  Administered 2015-09-24: 1000 mL via INTRAVENOUS

## 2015-09-24 MED ORDER — CEFTRIAXONE SODIUM 1 G IJ SOLR
1.0000 g | Freq: Once | INTRAMUSCULAR | Status: AC
  Administered 2015-09-24: 1 g via INTRAVENOUS
  Filled 2015-09-24: qty 10

## 2015-09-24 MED ORDER — ONDANSETRON HCL 4 MG PO TABS: 4.0000 mg | ORAL_TABLET | Freq: Four times a day (QID) | ORAL | Status: DC | PRN

## 2015-09-24 MED ORDER — SODIUM CHLORIDE 0.9 % IV SOLN
INTRAVENOUS | Status: DC
  Administered 2015-09-24 – 2015-09-27 (×4): via INTRAVENOUS

## 2015-09-24 MED ORDER — SODIUM CHLORIDE 0.9 % IV SOLN
INTRAVENOUS | Status: DC
  Administered 2015-09-24: 19:00:00 via INTRAVENOUS

## 2015-09-24 MED ORDER — INFLUENZA VAC SPLIT QUAD 0.5 ML IM SUSY
0.5000 mL | PREFILLED_SYRINGE | INTRAMUSCULAR | Status: AC
  Administered 2015-09-25: 0.5 mL via INTRAMUSCULAR
  Filled 2015-09-24: qty 0.5

## 2015-09-24 MED ORDER — SODIUM CHLORIDE 0.9% FLUSH
3.0000 mL | Freq: Two times a day (BID) | INTRAVENOUS | Status: DC
  Administered 2015-09-25: 3 mL via INTRAVENOUS

## 2015-09-24 MED ORDER — ONDANSETRON HCL 4 MG/2ML IJ SOLN: 4.0000 mg | Freq: Four times a day (QID) | INTRAMUSCULAR | Status: DC | PRN

## 2015-09-24 MED ORDER — GABAPENTIN 300 MG PO CAPS
300.0000 mg | ORAL_CAPSULE | Freq: Three times a day (TID) | ORAL | Status: DC
  Administered 2015-09-24: 300 mg via ORAL
  Filled 2015-09-24: qty 1

## 2015-09-24 NOTE — ED Provider Notes (Signed)
[facility]-EMERGENCY DEPT Provider Note   CSN: [id number] Arrival date & time: 09/24/15  1418     History   Chief Complaint Chief Complaint  Patient presents with  . Dizziness  . Weakness  . Nausea    HPI [name] is a 50 y.o. female.  The history is provided by the patient and medical records.  Dizziness  Associated symptoms: weakness   Weakness      50 year old female with history of anemia, arthritis, diabetes, hypertension, peripheral neuropathy, prior stroke, presenting to the ED for lightheadedness, weakness, and nausea. Patient was admitted to the hospital one month ago for profound anemia and acute renal failure. She left the hospital about 6 days ago but states she has felt like she has been getting a "cold" for the past few days. She reports some nasal congestion, dry cough, sinus pressure, nausea and emesis. States she has been getting progressively better but still does not feel back to 100%.  States for the past 2 days she has not been vomiting but has been nauseated.  States yesterday she was able to eat some cantaloupe and drink small amounts of fluids but states she continues to feel weak. States this morning she felt "swimmy headed" when she tried to start moving around, however she feels better at this time. States given all the issues during her recent hospital stay, her home health nurse encouraged her to come here to be evaluated.  Past Medical History:  Diagnosis Date  . Anemia   . Arthritis   . Diabetes mellitus   . Diabetic foot ulcer associated with type 2 diabetes mellitus (HCC)   . Hypertension   . Intracerebral hemorrhage (HCC) As a teenager    States she had burst blood vessel as teenager, now with resultant minor visual field and hearing deficits  . Neuropathy (HCC)   . Stroke [facility])     Patient Active Problem List   Diagnosis Date Noted  . Diabetes mellitus due to underlying condition with chronic kidney disease, without long-term current  use of insulin (HCC)   . Fever   . Status post below knee amputation of right lower extremity (HCC)   . Diabetic peripheral neuropathy (HCC)   . Neuropathic pain   . Benign essential HTN   . Folliculitis   . Slow transit constipation   . Anemia of chronic disease   . Bilateral hydronephrosis   . Urinary retention   . CKD (chronic kidney disease)   . Uncontrolled type 2 diabetes mellitus with diabetic nephropathy, without long-term current use of insulin (HCC)   . Vasculopathy   . Debility 09/04/2015  . AKI (acute kidney injury) (HCC)   . DM type 2 with diabetic peripheral neuropathy (HCC)   . S/P BKA (below knee amputation) unilateral (HCC)   . Medical non-compliance   . Benign skin lesion of multiple sites   . Tachypnea   . Acute blood loss anemia   . Neurogenic bladder   . Occult blood positive stool   . Rectovaginal fistula   . Controlled diabetes mellitus type 2 with complications (HCC)   . Acute on chronic renal failure (HCC)   . Hydronephrosis determined by ultrasound   . Papular rash   . Uncontrolled type 2 diabetes mellitus with complication (HCC)   . Paresthesia   . Thrombocytopenia (HCC) 08/23/2015  . Pressure ulcer 08/23/2015  . Severe anemia 08/23/2015  . UTI (lower urinary tract infection) 08/23/2015  . Absolute anemia 08/23/2015  . Peripheral  vascular disease, unspecified (HCC) 04/27/2012  . Unilateral complete BKA (HCC) 07/09/2011  . Gas gangrene (HCC) 07/02/2011  . Sepsis(995.91) 07/02/2011  . Acute renal failure (HCC) 07/02/2011  . Diabetes mellitus (HCC) 11/22/2010  . Diabetic foot ulcer associated with type 2 diabetes mellitus (HCC)   . Hypertension   . Arthritis   . Neuropathy (HCC)   . Intracerebral hemorrhage [facility])     Past Surgical History:  Procedure Laterality Date  . AMPUTATION  11/24/2010   Procedure: AMPUTATION FOOT;  Surgeon: [name], MD;  Location[facility];  Service: Orthopedics;  Laterality: Right;  Right  FOOT TRANS METATARSAL  AMPUTATION  . AMPUTATION  07/02/2011   Procedure: AMPUTATION BELOW KNEE;  Surgeon: [name], MD;  Location: [facility] OR;  Service: Orthopedics;  Laterality: Right;  . APPLICATION OF WOUND VAC  07/02/2011   Procedure: APPLICATION OF WOUND VAC;  Surgeon: [name], MD;  Location: [facility] OR;  Service: Orthopedics;  Laterality: Right;  . BRAIN SURGERY  1980's   Previous brain surgery in [place], [place]  . CESAREAN SECTION    . COLONOSCOPY N/A 09/17/2015   Procedure: COLONOSCOPY;  Surgeon: Charlott Rakes, MD;  Location[facility] ENDOSCOPY;  Service: Endoscopy;  Laterality: N/A;  . ESOPHAGOGASTRODUODENOSCOPY N/A 09/17/2015   Procedure: ESOPHAGOGASTRODUODENOSCOPY (EGD);  Surgeon: Charlott Rakes, MD;  Location: [facility] ENDOSCOPY;  Service: Endoscopy;  Laterality: N/A;  . I&D EXTREMITY  11/24/2010   Procedure: IRRIGATION AND DEBRIDEMENT EXTREMITY;  Surgeon: [name], MD;  Location: [facility] OR;  Service: Orthopedics;  Laterality: Left;  Debriedment of left plantar ulcer and trimming of toenails  . I&D EXTREMITY  07/02/2011   Procedure: IRRIGATION AND DEBRIDEMENT EXTREMITY;  Surgeon: [name], MD;  Location: [facility] OR;  Service: Orthopedics;  Laterality: Left;  I & D of Left Foot  . I&D EXTREMITY  07/06/2011   Procedure: IRRIGATION AND DEBRIDEMENT EXTREMITY;  Surgeon: [name], MD;  Location: [facility] OR;  Service: Orthopedics;  Laterality: Right;  IRRIGATION/DEBRIDEMENT RIGHT BELOW KNEE AMPUTATION    OB History    No data available       Home Medications    Prior to Admission medications   Medication Sig Start Date End Date Taking? Authorizing Provider  B Complex-C (B-COMPLEX WITH VITAMIN C) tablet Take 1 tablet by mouth daily. 09/19/15   Jacquelynn Cree, PA-C  calcium carbonate (TUMS - DOSED IN MG ELEMENTAL CALCIUM) 500 MG chewable tablet Chew 2 tablets (400 mg of elemental calcium total) by mouth 3 (three) times daily. 09/18/15   Jacquelynn Cree, PA-C  carvedilol (COREG) 6.25 MG tablet Take 1 tablet (6.25 mg total) by mouth 2  (two) times daily with a meal. 09/18/15   Evlyn Kanner Love, PA-C  famotidine (PEPCID) 20 MG tablet Take 1 tablet (20 mg total) by mouth 2 (two) times daily. 09/18/15   Jacquelynn Cree, PA-C  ferrous sulfate 325 (65 FE) MG tablet Take 1 tablet (325 mg total) by mouth 2 (two) times daily with a meal. 09/18/15   Evlyn Kanner Love, PA-C  gabapentin (NEURONTIN) 300 MG capsule Take 1 capsule (300 mg total) by mouth 3 (three) times daily. 09/18/15   Jacquelynn Cree, PA-C  HYDROcodone-acetaminophen (NORCO/VICODIN) 5-325 MG tablet Take 0.5-1 tablets by mouth daily as needed for severe pain. 09/18/15   Jacquelynn Cree, PA-C  Menthol, Topical Analgesic, (ICY HOT EX) Apply 1 application topically 2 (two) times daily as needed (pain).    Historical Provider, MD  Naphazoline HCl (CLEAR EYES OP) Place  1 drop into both eyes daily as needed (dry eyes/itching).    Historical Provider, MD  prochlorperazine (COMPAZINE) 5 MG tablet Take 1-2 tablets (5-10 mg total) by mouth every 8 (eight) hours as needed for nausea. 09/18/15   Jacquelynn CreePamela S Love, PA-C    Family History Family History  Problem Relation Age of Onset  . Diabetes Mother   . Hypertension Mother   . Hyperlipidemia Mother   . Diabetes Father   . Cancer Father     prostate  . Hyperlipidemia Father   . Hypertension Father   . Diabetes Brother   . Peripheral Artery Disease Brother     has had toes amputated    Social History Social History  Substance Use Topics  . Smoking status: Never Smoker  . Smokeless tobacco: Never Used  . Alcohol use No     Allergies   Review of patient's allergies indicates no known allergies.   Review of Systems Review of Systems  Neurological: Positive for weakness and light-headedness.  All other systems reviewed and are negative.    Physical Exam Updated Vital Signs BP (!) 76/53   Pulse 82   Temp 98.2 F (36.8 C) (Oral)   LMP  (LMP Unknown)   SpO2 100%   Physical Exam  Constitutional: She is oriented to person, place,  and time. She appears well-developed and well-nourished.  Chronically ill appearing  HENT:  Head: Normocephalic and atraumatic.  Mouth/Throat: Oropharynx is clear and moist.  Dry mucous membranes  Eyes: Conjunctivae and EOM are normal. Pupils are equal, round, and reactive to light.  Neck: Normal range of motion.  Cardiovascular: Normal rate, regular rhythm and normal heart sounds.   Pulmonary/Chest: Effort normal and breath sounds normal.  Abdominal: Soft. Bowel sounds are normal.  Musculoskeletal: Normal range of motion.  Right BKA Left leg with chronic, scabbed over wounds; no drainage or bleeding  Neurological: She is alert and oriented to person, place, and time.  Skin: Skin is warm and dry.  Psychiatric: She has a normal mood and affect.  Nursing note and vitals reviewed.    ED Treatments / Results  Labs (all labs ordered are listed, but only abnormal results are displayed) Labs Reviewed  BASIC METABOLIC PANEL - Abnormal; Notable for the following:       Result Value   Sodium 132 (*)    Chloride 98 (*)    Glucose, Bld 172 (*)    BUN 25 (*)    Creatinine, Ser 2.26 (*)    GFR calc non Af Amer 24 (*)    GFR calc Af Amer 28 (*)    All other components within normal limits  CBC - Abnormal; Notable for the following:    WBC 11.2 (*)    RBC 3.86 (*)    Hemoglobin 10.5 (*)    HCT 32.7 (*)    All other components within normal limits  CBG MONITORING, ED - Abnormal; Notable for the following:    Glucose-Capillary 168 (*)    All other components within normal limits  URINALYSIS, ROUTINE W REFLEX MICROSCOPIC (NOT AT [facility])  I-STAT TROPOININ, ED  I-STAT CG4 LACTIC ACID, ED    EKG  EKG Interpretation None       Radiology No results found.  Procedures Procedures (including critical care time)  Medications Ordered in ED Medications  0.9 %  sodium chloride infusion (not administered)  cefTRIAXone (ROCEPHIN) 1 g in dextrose 5 % 50 mL IVPB (not administered)    sodium  chloride 0.9 % bolus 1,000 mL (1,000 mLs Intravenous New Bag/Given 09/24/15 1552)     Initial Impression / Assessment and Plan / ED Course  I have reviewed the triage vital signs and the nursing notes.  Pertinent labs & imaging results that were available during my care of the patient were reviewed by me and considered in my medical decision making (see chart for details).  Clinical Course   50 year old female here with generalized weakness and lightheadedness. Left the hospital 6 days ago after a nearly month-long admission for anemia and acute renal failure. States she developed a cold but has been getting better. Episode of lightheadedness this morning but is now feeling better. She is afebrile and nontoxic. She is hypotensive. Her mucous membranes are dry, appears clinically dehydrated. Labs today revealing recurrent AK I, which I suspect is from dehydration. She also has evidence of a UTI. We'll place on Rocephin pending urine culture. Given that this has been a rapid progression since her discharge from hospital 6 days ago, will readmit for IV hydration and antibiotics. Her blood pressure has responded well to fluids.  I have spoken with hospitalist, Dr. Allena Katz, who will admit for observation.  Final Clinical Impressions(s) / ED Diagnoses   Final diagnoses:  UTI (lower urinary tract infection)  AKI (acute kidney injury) (HCC)  Weakness    New Prescriptions New Prescriptions   No medications on file     Oletha Blend 09/24/15 1854    Canary Brim Tegeler, MD 09/24/15 2008

## 2015-09-24 NOTE — ED Notes (Signed)
Bed: WA02 Expected date:  Expected time:  Means of arrival:  Comments: ems 

## 2015-09-24 NOTE — ED Triage Notes (Signed)
Per EMS, Pt, from home, c/o "head feeling heavy" and "feeling loopy" starting this morning and chronic neck pain.  Pain score 6/10.  EMS reports Pt was recently admitted for anemia.  Home Health reported decreased urinary output to EMS.  Hx of DM.  CBG 201 prior to arrival.

## 2015-09-24 NOTE — ED Notes (Signed)
Pt aware UA is needed.  

## 2015-09-24 NOTE — Progress Notes (Signed)
Patient listed as having no insurance but Cammie Fulp listed as pcp. EDCM spoke to patient at bedside. Patient reports she is no longer seen by Dr. Jillyn HiddenFulp.  She reports she is seen by many specialists. She reports she is working on getting her insurance back Medicaid and BCBS and should be restored in October. EDCM discussed orange card services with patient and provided patient with contact information for [facility].  EDCM also informed patient [facility] is accepting new patients this month and provided contact information for the office.  EDCM also provided patient the following:   [facility], informed patient of services there and walk in times.  EDCM also provided patient with list of pcps who accept self pay patients, list of discount pharmacies and websites needymeds.org and GoodRX.com for medication assistance, phone number to inquire about the orange card, phone number to inquire about Medicaid, phone number to inquire about the Affordable Care Act, financial resources in the community such as local churches, [employer], urban ministries, and dental assistance for uninsured patients.  Patient thankful for resources.  No further EDCM needs at this time.

## 2015-09-24 NOTE — H&P (Signed)
[facility] History and Physical   Patient: [name]:[id number]   PCP: [name], MD DOB: 1965-02-23   DOA: 09/24/2015   DOS: 09/24/2015   DOS: the patient was seen and examined on 09/24/2015  Patient coming from: The patient is coming from home.  Chief Complaint: Dizziness  HPI: [name] is a 50 y.o. female with Past medical history of right BKA, type 2 diabetes mellitus, peripheral neuropathy, chronic kidney disease, bilateral hydronephrosis, HTN, bicytopenia. Patient presented with complaints of dizziness started this morning. Patient has been having poor appetite for last 3 days as well as fatigue. No chest pain or abdominal pain no nausea no vomiting. No diarrhea no constipation. Patient mentions she is emptying to full bags of uterine at least in a day. Patient mentions she is compliant with her medication. Patient was seen by home health who found that the patient was having low blood pressure and therefore recommended to be evaluated by the ER.  ED Course: Initial blood pressure was 70s systolic, improved with IV hydration. Patient is feeling better.  At her baseline ambulates with support And is independent for most of her ADL; manages her medication on her own.  Review of Systems: as mentioned in the history of present illness.  All other systems reviewed and are negative.  Past Medical History:  Diagnosis Date  . Anemia   . Arthritis   . Diabetes mellitus   . Diabetic foot ulcer associated with type 2 diabetes mellitus (HCC)   . Hypertension   . Intracerebral hemorrhage (HCC) As a teenager    States she had burst blood vessel as teenager, now with resultant minor visual field and hearing deficits  . Neuropathy (HCC)   . Stroke [facility])    Past Surgical History:  Procedure Laterality Date  . AMPUTATION  11/24/2010   Procedure: AMPUTATION FOOT;  Surgeon: [name], MD;  Location: [facility] OR;  Service: Orthopedics;  Laterality: Right;  Right   FOOT TRANS METATARSAL AMPUTATION  . AMPUTATION  07/02/2011   Procedure: AMPUTATION BELOW KNEE;  Surgeon: [name], MD;  Location: [facility] OR;  Service: Orthopedics;  Laterality: Right;  . APPLICATION OF WOUND VAC  07/02/2011   Procedure: APPLICATION OF WOUND VAC;  Surgeon: [name], MD;  Location: [facility] OR;  Service: Orthopedics;  Laterality: Right;  . BRAIN SURGERY  1980's   Previous brain surgery in [place], [place]  . CESAREAN SECTION    . COLONOSCOPY N/A 09/17/2015   Procedure: COLONOSCOPY;  Surgeon: Charlott Rakes, MD;  Location: [facility] ENDOSCOPY;  Service: Endoscopy;  Laterality: N/A;  . ESOPHAGOGASTRODUODENOSCOPY N/A 09/17/2015   Procedure: ESOPHAGOGASTRODUODENOSCOPY (EGD);  Surgeon: Charlott Rakes, MD;  Location: [facility] ENDOSCOPY;  Service: Endoscopy;  Laterality: N/A;  . I&D EXTREMITY  11/24/2010   Procedure: IRRIGATION AND DEBRIDEMENT EXTREMITY;  Surgeon: [name], MD;  Location: [facility] OR;  Service: Orthopedics;  Laterality: Left;  Debriedment of left plantar ulcer and trimming of toenails  . I&D EXTREMITY  07/02/2011   Procedure: IRRIGATION AND DEBRIDEMENT EXTREMITY;  Surgeon: [name], MD;  Location: [facility] OR;  Service: Orthopedics;  Laterality: Left;  I & D of Left Foot  . I&D EXTREMITY  07/06/2011   Procedure: IRRIGATION AND DEBRIDEMENT EXTREMITY;  Surgeon: [name], MD;  Location: [facility] OR;  Service: Orthopedics;  Laterality: Right;  IRRIGATION/DEBRIDEMENT RIGHT BELOW KNEE AMPUTATION   Social History:  reports that she has never smoked. She has never used smokeless tobacco. She reports that she does not drink alcohol  or use drugs.  No Known Allergies   Family History  Problem Relation Age of Onset  . Diabetes Mother   . Hypertension Mother   . Hyperlipidemia Mother   . Diabetes Father   . Cancer Father     prostate  . Hyperlipidemia Father   . Hypertension Father   . Diabetes Brother   . Peripheral Artery Disease Brother     has had toes amputated     Prior to Admission  medications   Medication Sig Start Date End Date Taking? Authorizing Provider  acetaminophen (TYLENOL) 500 MG tablet Take 1,000 mg by mouth every 6 (six) hours as needed for moderate pain.   Yes Historical Provider, MD  B Complex-C (B-COMPLEX WITH VITAMIN C) tablet Take 1 tablet by mouth daily. 09/19/15  Yes Evlyn KannerPamela S Love, PA-C  calcium carbonate (TUMS - DOSED IN MG ELEMENTAL CALCIUM) 500 MG chewable tablet Chew 2 tablets (400 mg of elemental calcium total) by mouth 3 (three) times daily. 09/18/15  Yes Evlyn KannerPamela S Love, PA-C  carvedilol (COREG) 6.25 MG tablet Take 1 tablet (6.25 mg total) by mouth 2 (two) times daily with a meal. 09/18/15  Yes Evlyn KannerPamela S Love, PA-C  famotidine (PEPCID) 20 MG tablet Take 1 tablet (20 mg total) by mouth 2 (two) times daily. 09/18/15  Yes Evlyn KannerPamela S Love, PA-C  ferrous sulfate 325 (65 FE) MG tablet Take 1 tablet (325 mg total) by mouth 2 (two) times daily with a meal. 09/18/15  Yes Evlyn KannerPamela S Love, PA-C  gabapentin (NEURONTIN) 300 MG capsule Take 1 capsule (300 mg total) by mouth 3 (three) times daily. 09/18/15  Yes Evlyn KannerPamela S Love, PA-C  HYDROcodone-acetaminophen (NORCO/VICODIN) 5-325 MG tablet Take 0.5-1 tablets by mouth daily as needed for severe pain. 09/18/15  Yes Evlyn KannerPamela S Love, PA-C  Menthol, Topical Analgesic, (ICY HOT EX) Apply 1 application topically 2 (two) times daily as needed (pain).   Yes Historical Provider, MD  Naphazoline HCl (CLEAR EYES OP) Place 1 drop into both eyes daily as needed (dry eyes/itching).   Yes Historical Provider, MD  prochlorperazine (COMPAZINE) 5 MG tablet Take 1-2 tablets (5-10 mg total) by mouth every 8 (eight) hours as needed for nausea. 09/18/15  Yes Jacquelynn CreePamela S Love, PA-C    Physical Exam: Vitals:   09/24/15 1800 09/24/15 1900 09/24/15 1930 09/24/15 2004  BP: 115/79 127/73 132/70 112/74  Pulse: 86 86 89 85  Resp: 17 13 17 18   Temp:    98.3 F (36.8 C)  TempSrc:    Oral  SpO2: 100% 100% 100% 100%  Weight:    82.5 kg (181 lb 14.1 oz)  Height:     5\' 7"  (1.702 m)    General: Alert, Awake and Oriented to Time, Place and Person. Appear in mild distress, affect appropriate Eyes: PERRL, Conjunctiva normal ENT: Oral Mucosa clear moist. Neck: no JVD, no Abnormal Mass Or lumps Cardiovascular: S1 and S2 Present, no Murmur, Peripheral Pulses Present Respiratory: Bilateral Air entry equal and Decreased, no use of accessory muscle, Clear to Auscultation, no Crackles, no wheezes Abdomen: Bowel Sound present, Soft and no tenderness Skin: no redness, multiple ulcers, healing well, no induration Extremities: Right BKA, no Pedal edema, no calf tenderness Neurologic: Grossly no focal neuro deficit. Bilaterally Equal motor strength  Labs on Admission:  CBC:  Recent Labs Lab 09/18/15 0557 09/24/15 1451  WBC 7.0 11.2*  NEUTROABS 5.1  --   HGB 8.5* 10.5*  HCT 28.4* 32.7*  MCV 89.3 84.7  PLT 79* 195   Basic Metabolic Panel:  Recent Labs Lab 09/24/15 1451  NA 132*  K 3.8  CL 98*  CO2 24  GLUCOSE 172*  BUN 25*  CREATININE 2.26*  CALCIUM 9.1   GFR: Estimated Creatinine Clearance: 33.6 mL/min (by C-G formula based on SCr of 2.26 mg/dL (H)). Liver Function Tests: No results for input(s): AST, ALT, ALKPHOS, BILITOT, PROT, ALBUMIN in the last 168 hours. No results for input(s): LIPASE, AMYLASE in the last 168 hours. No results for input(s): AMMONIA in the last 168 hours. Coagulation Profile: No results for input(s): INR, PROTIME in the last 168 hours. Cardiac Enzymes: No results for input(s): CKTOTAL, CKMB, CKMBINDEX, TROPONINI in the last 168 hours. BNP (last 3 results) No results for input(s): PROBNP in the last 8760 hours. HbA1C: No results for input(s): HGBA1C in the last 72 hours. CBG:  Recent Labs Lab 09/18/15 0650 09/18/15 1137 09/24/15 1444  GLUCAP 128* 99 168*   Lipid Profile: No results for input(s): CHOL, HDL, LDLCALC, TRIG, CHOLHDL, LDLDIRECT in the last 72 hours. Thyroid Function Tests: No results for  input(s): TSH, T4TOTAL, FREET4, T3FREE, THYROIDAB in the last 72 hours. Anemia Panel: No results for input(s): VITAMINB12, FOLATE, FERRITIN, TIBC, IRON, RETICCTPCT in the last 72 hours. Urine analysis:    Component Value Date/Time   COLORURINE YELLOW 09/24/2015 1944   APPEARANCEUR TURBID (A) 09/24/2015 1944   LABSPEC 1.018 09/24/2015 1944   PHURINE 5.0 09/24/2015 1944   GLUCOSEU NEGATIVE 09/24/2015 1944   HGBUR LARGE (A) 09/24/2015 1944   BILIRUBINUR SMALL (A) 09/24/2015 1944   KETONESUR NEGATIVE 09/24/2015 1944   PROTEINUR >300 (A) 09/24/2015 1944   UROBILINOGEN 2.0 (H) 07/08/2011 0051   NITRITE NEGATIVE 09/24/2015 1944   LEUKOCYTESUR LARGE (A) 09/24/2015 1944    Radiological Exams on Admission: Dg Chest 2 View  Result Date: 09/24/2015 CLINICAL DATA:  Generalized weakness and pain today. History of hypertension, diabetes, stroke. EXAM: CHEST  2 VIEW COMPARISON:  Chest radiograph September 16, 2015 FINDINGS: The cardiac silhouette is upper limits of normal in size, improved. Pulmonary vascular congestion without pleural effusion or focal consolidation. No pneumothorax. Soft tissue planes and included osseous structure nonsuspicious. IMPRESSION: Borderline cardiomegaly and pulmonary vascular congestion. Electronically Signed   By: Awilda Metro M.D.   On: 09/24/2015 16:14   EKG: Independently reviewed. normal sinus rhythm, nonspecific ST and T waves changes, prolonged QT interval.  Assessment/Plan 1. Acute worsening of stage 3 chronic kidney disease Patient appears to be clinically dehydrated, blood pressure improving with IV hydration. Presented with acute kidney injury which is likely prerenal in the setting of dehydration. We'll continue IV hydration. If renal function does not improve patient will require reimaging HER kidneys to look for hydronephrosis as well as urology consultation.  2. Possible UTI. Urine analysis is suggestive of possible UTI. Please start the patient  on IV ceftriaxone. Monitor culture.   3. Diabetic neuropathy. 2 diabetes mellitus. Diet controlled. Continue gabapentin. She was scheduled follow up with endocrinology as an outpatient  4. Bicytopenia. Patient is scheduled to follow-up with hematology for anemia as well as thrombocytopenia. Currently, cytopenia appears to be getting better and H&H remains stable. We'll continue to monitor.  5. Essential hypertension. Currently in the setting of hypotension I would hold all antihypertensive medication.  Nutrition: Cardiac and carb modified diet DVT Prophylaxis: subcutaneous Heparin  Advance goals of care discussion: Full code   Consults: None  Family Communication: no family was present at bedside, at the time  of interview.  Disposition: Admitted as observation, telemetry unit. Likely to be discharged home, in 2 days.  Author: Lynden Oxford, MD [facility] Pager: [phone] 09/24/2015  If 7PM-7AM, please contact night-coverage [email or web address] Password [email or web address]

## 2015-09-25 ENCOUNTER — Observation Stay (HOSPITAL_COMMUNITY): Payer: Medicaid Other

## 2015-09-25 DIAGNOSIS — I10 Essential (primary) hypertension: Secondary | ICD-10-CM

## 2015-09-25 DIAGNOSIS — N390 Urinary tract infection, site not specified: Secondary | ICD-10-CM

## 2015-09-25 DIAGNOSIS — E1122 Type 2 diabetes mellitus with diabetic chronic kidney disease: Secondary | ICD-10-CM | POA: Diagnosis present

## 2015-09-25 DIAGNOSIS — R197 Diarrhea, unspecified: Secondary | ICD-10-CM | POA: Diagnosis not present

## 2015-09-25 DIAGNOSIS — D631 Anemia in chronic kidney disease: Secondary | ICD-10-CM | POA: Diagnosis present

## 2015-09-25 DIAGNOSIS — E1142 Type 2 diabetes mellitus with diabetic polyneuropathy: Secondary | ICD-10-CM | POA: Diagnosis present

## 2015-09-25 DIAGNOSIS — Z89511 Acquired absence of right leg below knee: Secondary | ICD-10-CM | POA: Diagnosis not present

## 2015-09-25 DIAGNOSIS — I4581 Long QT syndrome: Secondary | ICD-10-CM | POA: Diagnosis not present

## 2015-09-25 DIAGNOSIS — D696 Thrombocytopenia, unspecified: Secondary | ICD-10-CM | POA: Diagnosis present

## 2015-09-25 DIAGNOSIS — I959 Hypotension, unspecified: Secondary | ICD-10-CM | POA: Diagnosis present

## 2015-09-25 DIAGNOSIS — E876 Hypokalemia: Secondary | ICD-10-CM | POA: Diagnosis not present

## 2015-09-25 DIAGNOSIS — B952 Enterococcus as the cause of diseases classified elsewhere: Secondary | ICD-10-CM | POA: Diagnosis present

## 2015-09-25 DIAGNOSIS — Z79899 Other long term (current) drug therapy: Secondary | ICD-10-CM | POA: Diagnosis not present

## 2015-09-25 DIAGNOSIS — R9431 Abnormal electrocardiogram [ECG] [EKG]: Secondary | ICD-10-CM | POA: Diagnosis present

## 2015-09-25 DIAGNOSIS — I129 Hypertensive chronic kidney disease with stage 1 through stage 4 chronic kidney disease, or unspecified chronic kidney disease: Secondary | ICD-10-CM | POA: Diagnosis present

## 2015-09-25 DIAGNOSIS — N183 Chronic kidney disease, stage 3 (moderate): Secondary | ICD-10-CM

## 2015-09-25 DIAGNOSIS — N170 Acute kidney failure with tubular necrosis: Secondary | ICD-10-CM | POA: Diagnosis present

## 2015-09-25 DIAGNOSIS — R42 Dizziness and giddiness: Secondary | ICD-10-CM | POA: Diagnosis not present

## 2015-09-25 DIAGNOSIS — L98429 Non-pressure chronic ulcer of back with unspecified severity: Secondary | ICD-10-CM | POA: Diagnosis present

## 2015-09-25 DIAGNOSIS — Z8673 Personal history of transient ischemic attack (TIA), and cerebral infarction without residual deficits: Secondary | ICD-10-CM | POA: Diagnosis not present

## 2015-09-25 DIAGNOSIS — E860 Dehydration: Secondary | ICD-10-CM | POA: Diagnosis present

## 2015-09-25 DIAGNOSIS — N289 Disorder of kidney and ureter, unspecified: Secondary | ICD-10-CM

## 2015-09-25 DIAGNOSIS — E0822 Diabetes mellitus due to underlying condition with diabetic chronic kidney disease: Secondary | ICD-10-CM

## 2015-09-25 LAB — CBC
HCT: 31.6 % — ABNORMAL LOW (ref 36.0–46.0)
Hemoglobin: 10.2 g/dL — ABNORMAL LOW (ref 12.0–15.0)
MCH: 27.3 pg (ref 26.0–34.0)
MCHC: 32.3 g/dL (ref 30.0–36.0)
MCV: 84.5 fL (ref 78.0–100.0)
Platelets: 171 10*3/uL (ref 150–400)
RBC: 3.74 MIL/uL — ABNORMAL LOW (ref 3.87–5.11)
RDW: 14.8 % (ref 11.5–15.5)
WBC: 9.9 10*3/uL (ref 4.0–10.5)

## 2015-09-25 LAB — COMPREHENSIVE METABOLIC PANEL
ALT: 15 U/L (ref 14–54)
AST: 19 U/L (ref 15–41)
Albumin: 3.4 g/dL — ABNORMAL LOW (ref 3.5–5.0)
Alkaline Phosphatase: 84 U/L (ref 38–126)
Anion gap: 9 (ref 5–15)
BUN: 34 mg/dL — ABNORMAL HIGH (ref 6–20)
CO2: 25 mmol/L (ref 22–32)
Calcium: 8.4 mg/dL — ABNORMAL LOW (ref 8.9–10.3)
Chloride: 100 mmol/L — ABNORMAL LOW (ref 101–111)
Creatinine, Ser: 2.68 mg/dL — ABNORMAL HIGH (ref 0.44–1.00)
GFR calc Af Amer: 23 mL/min — ABNORMAL LOW (ref >60)
GFR calc non Af Amer: 20 mL/min — ABNORMAL LOW (ref >60)
Glucose, Bld: 119 mg/dL — ABNORMAL HIGH (ref 65–99)
Potassium: 3.3 mmol/L — ABNORMAL LOW (ref 3.5–5.1)
Sodium: 134 mmol/L — ABNORMAL LOW (ref 135–145)
Total Bilirubin: 0.3 mg/dL (ref 0.3–1.2)
Total Protein: 7.3 g/dL (ref 6.5–8.1)

## 2015-09-25 LAB — CREATININE, URINE, RANDOM: Creatinine, Urine: 77.39 mg/dL

## 2015-09-25 LAB — SODIUM, URINE, RANDOM: Sodium, Ur: 19 mmol/L

## 2015-09-25 MED ORDER — GABAPENTIN 300 MG PO CAPS
300.0000 mg | ORAL_CAPSULE | Freq: Every day | ORAL | Status: DC
  Administered 2015-09-25 – 2015-09-26 (×2): 300 mg via ORAL
  Filled 2015-09-25 (×2): qty 1

## 2015-09-25 MED ORDER — HYDROCODONE-ACETAMINOPHEN 5-325 MG PO TABS
1.0000 | ORAL_TABLET | ORAL | Status: DC | PRN
Start: 2015-09-25 — End: 2015-09-25

## 2015-09-25 MED ORDER — POTASSIUM CHLORIDE CRYS ER 20 MEQ PO TBCR
40.0000 meq | EXTENDED_RELEASE_TABLET | Freq: Once | ORAL | Status: AC
  Administered 2015-09-25: 40 meq via ORAL
  Filled 2015-09-25: qty 2

## 2015-09-25 MED ORDER — PRO-STAT SUGAR FREE PO LIQD
30.0000 mL | Freq: Three times a day (TID) | ORAL | Status: DC
  Administered 2015-09-25 – 2015-09-27 (×6): 30 mL via ORAL
  Filled 2015-09-25 (×6): qty 30

## 2015-09-25 MED ORDER — DEXTROSE 5 % IV SOLN
1.0000 g | INTRAVENOUS | Status: DC
  Administered 2015-09-25 – 2015-09-26 (×2): 1 g via INTRAVENOUS
  Filled 2015-09-25 (×3): qty 10

## 2015-09-25 MED ORDER — HYDROCODONE-ACETAMINOPHEN 5-325 MG PO TABS
1.0000 | ORAL_TABLET | ORAL | Status: DC | PRN
  Administered 2015-09-25 – 2015-09-27 (×5): 2 via ORAL
  Filled 2015-09-25 (×5): qty 2

## 2015-09-25 NOTE — Care Management Note (Signed)
Case Management Note  Patient Details  Name: Cheryl HansenMichelle L Wolf MRN: [id number] Date of Birth: 05/30/1965  Subjective/Objective: 50 y/o f admitted w/Acute worsening stg 3 ckd. From home. See ED CM notes-has already provided patient w/pcp listing, insurance info.Also followed by [employer] [facility] [employer]. Active w/[facility] HHRN-await HHC order if needed @ d/c.                  Action/Plan:d/c home.   Expected Discharge Date:                  Expected Discharge Plan:  Home w Home Health Services  In-House Referral:     Discharge planning Services  CM Consult  Post Acute Care Choice:  [facility] Choice offered to:  Patient  DME Arranged:    DME Agency:     HH Arranged:    HH Agency:     Status of Service:  In process, will continue to follow  If discussed at Long Length of Stay Meetings, dates discussed:    Additional Comments:  Lanier ClamMahabir, Kyrah Schiro, RN 09/25/2015, 11:56 AM

## 2015-09-25 NOTE — Progress Notes (Signed)
Initial Nutrition Assessment  INTERVENTION:   -Provide Prostat liquid protein PO 30 ml TID with meals, each supplement provides 100 kcal, 15 grams protein. -Encourage PO intake -RD to continue to monitor  NUTRITION DIAGNOSIS:   Increased nutrient needs related to wound healing as evidenced by estimated needs.  GOAL:   Patient will meet greater than or equal to 90% of their needs  MONITOR:   PO intake, Supplement acceptance, Labs, Weight trends, Skin, I & O's  REASON FOR ASSESSMENT:   Malnutrition Screening Tool    ASSESSMENT:   50 year old female with history of intracerebral hemorrhage s/p left craniotomy as teenager, diabetes mellitus type 2, hypertension, right BKA due to diabetic foot ulcer 07/2011, poor PO intake with had several admissions over the last 6 months. She was hospitalized in August due to acute on chronic kidney disease due to obstructive uropathy, severe anemia requiring blood transfusion.   Patient reports eating well this morning, she had eggs, sausage and oatmeal for breakfast, consuming 75%. Pt states her appetite has been fluctuating since [facility]. Pt states she was having some N/V this past weekend. She is agreeable to receiving Prostat supplements during this admission as well as she doesn't like Ensure and Boost type drinks. Patient continues to lost weight. Per weight history, pt has lost 18 lb since 9/5 (9% wt loss x 2.5 weeks, significant for time frame). UBW is 240 lb. Nutrition focused physical exam shows no sign of depletion of muscle mass or body fat.  Medications: Ferrous sulfate tablet BID Labs reviewed: Low Na, K GFR: 23  Diet Order:  Diet Heart Room service appropriate? Yes; Fluid consistency: Thin  Skin:   Stage II breast wound, unstageable buttocks wounds  Last BM:  9/5  Height:   Ht Readings from Last 1 Encounters:  09/24/15 5\' 7"  (1.702 m)    Weight:   Wt Readings from Last 1  Encounters:  09/25/15 183 lb (83 kg)    Ideal Body Weight:  57.7 kg (based on R BKA)  BMI:  Body mass index is 28.66 kg/m.  Estimated Nutritional Needs:   Kcal:  2000-2200  Protein:  100-110g  Fluid:  2L/day  EDUCATION NEEDS:   Education needs addressed  Tilda FrancoLindsey Dmarco Baldus, MS, RD, LDN Pager: [phone] After Hours Pager: [phone]

## 2015-09-25 NOTE — Progress Notes (Signed)
Patient is requesting pain medication. Pt. rates pain 8/10 in hips, throbbing pain. PCP on call notified.

## 2015-09-25 NOTE — Progress Notes (Signed)
Pharmacy Antibiotic Note  Cheryl HansenMichelle L Wolf is a 50 y.o. female admitted on 09/24/2015 with UTI.  Pharmacy has been consulted for rocephin dosing.  Plan: Rocephin 1Gm IV q24h  Height: 5\' 7"  (170.2 cm) Weight: 181 lb 14.1 oz (82.5 kg) IBW/kg (Calculated) : 61.6  Temp (24hrs), Avg:98.3 F (36.8 C), Min:98.2 F (36.8 C), Max:98.3 F (36.8 C)   Recent Labs Lab 09/18/15 0557 09/24/15 1451 09/24/15 1559  WBC 7.0 11.2*  --   CREATININE  --  2.26*  --   LATICACIDVEN  --   --  1.57    Estimated Creatinine Clearance: 33.6 mL/min (by C-G formula based on SCr of 2.26 mg/dL (H)).    No Known Allergies  Antimicrobials this admission: 9/21 rocephin >>    >>   Dose adjustments this admission:   Microbiology results:  BCx:   UCx:    Sputum:    MRSA PCR:   Thank you for allowing pharmacy to be a part of this patient's care. Rx will sign off at this due b/c no further dosing adjustments are needed.    Lorenza EvangelistGreen, Kelicia Youtz R 09/25/2015 1:15 AM

## 2015-09-25 NOTE — Progress Notes (Addendum)
PROGRESS NOTE  [name]  SFK:[id number] DOB: Feb 19, 1965 DOA: 09/24/2015 PCP: [name], MD  Brief Narrative:   The patient reportedly 50-year-old female with history of intracerebral hemorrhage s/p left craniotomy as teenager, diabetes mellitus type 2, hypertension, right BKA due to diabetic foot ulcer 07/2011, poor PO intake with had several admissions over the last 6 months. She was hospitalized in August due to acute on chronic kidney disease due to obstructive uropathy, severe anemia requiring blood transfusion.  She was seen by hematology who performed a bone marrow biopsy which was essentially normal.  She was given iron and aranesp.  She also had heme positive stools, however, endoscopy demonstrated on mild gastritis and hemorrhoids.  Her anemia was ultimately attributed to her CKD.  She had obstructive uropathy with bilateral hydronephrosis.  She was seen by nephrology and urology and had a foley catheter placed with improvement in her kidney function.  She was discharged with foley catheter.  Due to chronic nausea she had poor oral intake.  She was discharged from acute rehab on 09/18/2015 and reports that she has not been eating or drinking well since discharge.  She denies NSAID use.  She presented to the ER with dizziness and was found to be hypotensive, SBP in the 70s.  Her BP improved quickly with IVF.  She also had recurrent acute on chronic kidney disease and was admitted for further evaluation.    Assessment & Plan:   Principal Problem:   Acute worsening of stage 3 chronic kidney disease Active Problems:   Hypertension   Neuropathy (HCC)   Intracerebral hemorrhage (HCC)   Diabetes mellitus (HCC)   Thrombocytopenia (HCC)   UTI (lower urinary tract infection)   Hydronephrosis determined by ultrasound   S/P BKA (below knee amputation) unilateral (HCC)   Prolonged Q-T interval on ECG  Acute on chronic kidney disease stage 3, creatinine continuing to rise.  She has some  ATN from hypotension. May take a couple of days before her kidney function starts to improve. -  Continue Foley catheter -  Renally dose medications (gabapentin) -  Continue IVF -  FENa:  0.49%, prerenal -  Repeat US:  Resolution of hydronephrosis  Possible UTI suggested by too numerous to count WBC, many bacteria, large leukocyte esterase -  Follow-up urine culture -  Continue ceftriaxone  Diabetes mellitus type 2 with peripheral neuropathy, diet controlled.   -  Last A1c was 5.3 on 08/27/2015 -  Start SSI if CBG are consistently > 140 -  Renally dose gabapentin  Normocytic anemia due to chronic kidney disease -  Hemoglobin stable and improved since last admission -  Continue prn iron and aranesp to keep hgb near 18m/dl  Essential hypertension. Continue to hold all antihypertensive medication.  Hypokalemia, start oral potassium supplementation  Prolonged QTc 532 msec -  Avoid QTc prolonging medications  DVT prophylaxis:  Heparin Code Status:  Full code Family Communication:  Patient alone Disposition Plan:  Pending improvement in her kidney function   Consultants:   None  Procedures:  Renal ultrasound  Antimicrobials:   Ceftriaxone 9/20    Subjective: Patient is a difficult historian secondary to her history of intracerebral hemorrhage. States she has not had problems with her Foley catheter since her discharge from rehabilitation. She states she has had nausea which has prevented her from eating and drinking well. Denies any blood in her stools or urine since discharge.  Denies NSAID use  Objective: Vitals:   09/24/15 1930 09/24/15  2004 09/25/15 0555 09/25/15 1054  BP: 132/70 112/74 116/65 128/70  Pulse: 89 85 88 85  Resp: _0 Temp:  98.3 F (36.8 C) 98.6 F (37 C) 98.3 F (36.8 C)  TempSrc:  Oral Oral Oral  SpO2: 100% 100% 100% 100%  Weight:  82.5 kg (181 lb 14.1 oz) 83 kg (183 lb)   Height:  _1  (1.702 m)      Intake/Output Summary  (Last 24 hours) at 09/25/15 1331 Last data filed at 09/25/15 1100  Gross per 24 hour  Intake             1330 ml  Output             1700 ml  Net             -370 ml   Filed Weights   09/24/15 2004 09/25/15 0555  Weight: 82.5 kg (181 lb 14.1 oz) 83 kg (183 lb)    Examination:  General exam:  Adult Female.  No acute distress.  HEENT:  NCAT, MMM Respiratory system: Clear to auscultation bilaterally Cardiovascular system: Regular rate and rhythm, normal S1/S2. No murmurs, rubs, gallops or clicks.  Warm extremities Gastrointestinal system: Normal active bowel sounds, soft, nondistended, nontender. MSK:  Right BKA, stump wrapped in ACE bandage.  No left lower extremity edema Neuro:  Grossly moves all extremities.   Psych:  Tangential and timing of events she reports tends to get mixed up.  Cannot confidently answer ROS questions with Yes or No answers.      Data Reviewed: I have personally reviewed following labs and imaging studies  CBC:  Recent Labs Lab 09/24/15 1451 09/25/15 0600  WBC 11.2* 9.9  HGB 10.5* 10.2*  HCT 32.7* 31.6*  MCV 84.7 84.5  PLT 195 469   Basic Metabolic Panel:  Recent Labs Lab 09/24/15 1451 09/25/15 0600  NA 132* 134*  K 3.8 3.3*  CL 98* 100*  CO2 24 25  GLUCOSE 172* 119*  BUN 25* 34*  CREATININE 2.26* 2.68*  CALCIUM 9.1 8.4*   GFR: Estimated Creatinine Clearance: 28.4 mL/min (by C-G formula based on SCr of 2.68 mg/dL (H)). Liver Function Tests:  Recent Labs Lab 09/25/15 0600  AST 19  ALT 15  ALKPHOS 84  BILITOT 0.3  PROT 7.3  ALBUMIN 3.4*   No results for input(s): LIPASE, AMYLASE in the last 168 hours. No results for input(s): AMMONIA in the last 168 hours. Coagulation Profile: No results for input(s): INR, PROTIME in the last 168 hours. Cardiac Enzymes: No results for input(s): CKTOTAL, CKMB, CKMBINDEX, TROPONINI in the last 168 hours. BNP (last 3 results) No results for input(s): PROBNP in the last 8760  hours. HbA1C: No results for input(s): HGBA1C in the last 72 hours. CBG:  Recent Labs Lab 09/24/15 1444  GLUCAP 168*   Lipid Profile: No results for input(s): CHOL, HDL, LDLCALC, TRIG, CHOLHDL, LDLDIRECT in the last 72 hours. Thyroid Function Tests: No results for input(s): TSH, T4TOTAL, FREET4, T3FREE, THYROIDAB in the last 72 hours. Anemia Panel: No results for input(s): VITAMINB12, FOLATE, FERRITIN, TIBC, IRON, RETICCTPCT in the last 72 hours. Urine analysis:    Component Value Date/Time   COLORURINE YELLOW 09/24/2015 1944   APPEARANCEUR TURBID (A) 09/24/2015 1944   LABSPEC 1.018 09/24/2015 1944   PHURINE 5.0 09/24/2015 1944   GLUCOSEU NEGATIVE 09/24/2015 1944   HGBUR LARGE (A) 09/24/2015 1944   BILIRUBINUR SMALL (A) 09/24/2015 [place] NEGATIVE 09/24/2015 1944  PROTEINUR >300 (A) 09/24/2015 1944   UROBILINOGEN 2.0 (H) 07/08/2011 0051   NITRITE NEGATIVE 09/24/2015 1944   LEUKOCYTESUR LARGE (A) 09/24/2015 1944   Sepsis Labs: _0 (procalcitonin:4,lacticidven:4)  ) Recent Results (from the past 240 hour(s))  Culture, blood (routine x 2)     Status: None   Collection Time: 09/16/15  1:44 PM  Result Value Ref Range Status   Specimen Description BLOOD RIGHT ARM  Final   Special Requests BOTTLES DRAWN AEROBIC ONLY  3CC  Final   Culture NO GROWTH 5 DAYS  Final   Report Status 09/21/2015 FINAL  Final  Culture, blood (routine x 2)     Status: None   Collection Time: 09/16/15  1:52 PM  Result Value Ref Range Status   Specimen Description BLOOD RIGHT ARM  Final   Special Requests BOTTLES DRAWN AEROBIC ONLY  [place]  Final   Culture NO GROWTH 5 DAYS  Final   Report Status 09/21/2015 FINAL  Final  Urine culture     Status: None   Collection Time: 09/16/15  2:42 PM  Result Value Ref Range Status   Specimen Description URINE, CLEAN CATCH  Final   Special Requests NONE  Final   Culture NO GROWTH  Final   Report Status 09/17/2015 FINAL  Final      Radiology  Studies: Dg Chest 2 View  Result Date: 09/24/2015 CLINICAL DATA:  Generalized weakness and pain today. History of hypertension, diabetes, stroke. EXAM: CHEST  2 VIEW COMPARISON:  Chest radiograph September 16, 2015 FINDINGS: The cardiac silhouette is upper limits of normal in size, improved. Pulmonary vascular congestion without pleural effusion or focal consolidation. No pneumothorax. Soft tissue planes and included osseous structure nonsuspicious. IMPRESSION: Borderline cardiomegaly and pulmonary vascular congestion. Electronically Signed   By: Elon Alas M.D.   On: 09/24/2015 16:14   US Renal  Result Date: 09/25/2015 CLINICAL DATA:  Acute renal failure EXAM: RENAL / URINARY TRACT ULTRASOUND COMPLETE COMPARISON:  08/31/2015 FINDINGS: Right Kidney: Length: 11.1 cm. Echogenicity within normal limits. No mass or hydronephrosis visualized. Left Kidney: Length: 12.1 cm. Echogenicity within normal limits. No mass or hydronephrosis visualized. Bladder: Foley catheter in place.  Bladder is decompressed. IMPRESSION: No acute findings.  No hydronephrosis. Electronically Signed   By: Rolm Baptise M.D.   On: 09/25/2015 08:57     Scheduled Meds: . cefTRIAXone (ROCEPHIN)  IV  1 g Intravenous Q24H  . famotidine  20 mg Oral BID  . ferrous sulfate  325 mg Oral BID WC  . gabapentin  300 mg Oral QHS  . heparin  5,000 Units Subcutaneous Q8H  . sodium chloride flush  3 mL Intravenous Q12H   Continuous Infusions: . sodium chloride 100 mL/hr at 09/25/15 0553     LOS: 0 days    Time spent: 30 min    Janece Canterbury, MD [facility] Pager [phone]  If 7PM-7AM, please contact night-coverage [email or web address] Password [email or web address] 09/25/2015, 1:31 PM

## 2015-09-26 ENCOUNTER — Telehealth: Payer: Self-pay | Admitting: *Deleted

## 2015-09-26 DIAGNOSIS — D696 Thrombocytopenia, unspecified: Secondary | ICD-10-CM

## 2015-09-26 LAB — RENAL FUNCTION PANEL
Albumin: 3.4 g/dL — ABNORMAL LOW (ref 3.5–5.0)
Anion gap: 9 (ref 5–15)
BUN: 41 mg/dL — ABNORMAL HIGH (ref 6–20)
CO2: 22 mmol/L (ref 22–32)
Calcium: 8.2 mg/dL — ABNORMAL LOW (ref 8.9–10.3)
Chloride: 106 mmol/L (ref 101–111)
Creatinine, Ser: 2.24 mg/dL — ABNORMAL HIGH (ref 0.44–1.00)
GFR calc Af Amer: 29 mL/min — ABNORMAL LOW (ref >60)
GFR calc non Af Amer: 25 mL/min — ABNORMAL LOW (ref >60)
Glucose, Bld: 173 mg/dL — ABNORMAL HIGH (ref 65–99)
Phosphorus: 5.3 mg/dL — ABNORMAL HIGH (ref 2.5–4.6)
Potassium: 3.9 mmol/L (ref 3.5–5.1)
Sodium: 137 mmol/L (ref 135–145)

## 2015-09-26 LAB — CBC
HCT: 31.4 % — ABNORMAL LOW (ref 36.0–46.0)
Hemoglobin: 10.2 g/dL — ABNORMAL LOW (ref 12.0–15.0)
MCH: 26.9 pg (ref 26.0–34.0)
MCHC: 32.5 g/dL (ref 30.0–36.0)
MCV: 82.8 fL (ref 78.0–100.0)
Platelets: 112 10*3/uL — ABNORMAL LOW (ref 150–400)
RBC: 3.79 MIL/uL — ABNORMAL LOW (ref 3.87–5.11)
RDW: 14.6 % (ref 11.5–15.5)
WBC: 6.1 10*3/uL (ref 4.0–10.5)

## 2015-09-26 LAB — GLUCOSE, CAPILLARY
Glucose-Capillary: 147 mg/dL — ABNORMAL HIGH (ref 65–99)
Glucose-Capillary: 186 mg/dL — ABNORMAL HIGH (ref 65–99)
Glucose-Capillary: 215 mg/dL — ABNORMAL HIGH (ref 65–99)
Glucose-Capillary: 145 mg/dL — ABNORMAL HIGH (ref 65–99)

## 2015-09-26 LAB — MAGNESIUM: Magnesium: 1.6 mg/dL — ABNORMAL LOW (ref 1.7–2.4)

## 2015-09-26 MED ORDER — MAGNESIUM SULFATE 4 GM/100ML IV SOLN
4.0000 g | Freq: Once | INTRAVENOUS | Status: AC
  Administered 2015-09-26: 4 g via INTRAVENOUS
  Filled 2015-09-26: qty 100

## 2015-09-26 MED ORDER — INSULIN ASPART 100 UNIT/ML ~~LOC~~ SOLN
0.0000 [IU] | Freq: Three times a day (TID) | SUBCUTANEOUS | Status: DC
  Administered 2015-09-26: 3 [IU] via SUBCUTANEOUS
  Administered 2015-09-27 (×2): 1 [IU] via SUBCUTANEOUS

## 2015-09-26 NOTE — Evaluation (Signed)
Physical Therapy Evaluation Patient Details Name: TREONNA KLEE MRN: [id number] DOB: 1965/05/19 Today's Date: 09/26/2015   History of Present Illness  50 year old female with history of intracerebral hemorrhage s/p left craniotomy as teenager, diabetes mellitus type 2, hypertension, right BKA due to diabetic foot ulcer 07/2011 and admitted for acute on chronic kidney disease stage 3  Clinical Impression  Pt admitted with above diagnosis. Pt currently with functional limitations due to the deficits listed below (see PT Problem List).  Pt will benefit from skilled PT to increase their independence and safety with mobility to allow discharge to the venue listed below.  Pt able to transfer to recliner with min/guard for safety.  Pt reports she typically uses w/c at home and children assist as needed.  Pt states she plans to start PT for prosthetic training soon.     Follow Up Recommendations Supervision - Intermittent (pt reports possible outpatient f/u for prosthesis?)    Equipment Recommendations  None recommended by PT    Recommendations for Other Services       Precautions / Restrictions Precautions Precautions: Fall Precaution Comments: R BKA (2013)      Mobility  Bed Mobility Overal bed mobility: Needs Assistance Bed Mobility: Supine to Sit     Supine to sit: Supervision;HOB elevated        Transfers Overall transfer level: Needs assistance Equipment used: Rolling walker (2 wheeled) Transfers: Sit to/from Stand Sit to Stand: Min guard Stand pivot transfers: Min guard       General transfer comment: setup RW and recliner and pt able to transfer with min/guard for safety, also able to stand for 3 minutes without fatigue, tends to pivot foot vs hopping  Ambulation/Gait                Stairs            Wheelchair Mobility    Modified Rankin (Stroke Patients Only)       Balance                                              Pertinent Vitals/Pain      Home Living Family/patient expects to be discharged to:: Private residence Living Arrangements: Children Available Help at Discharge: Family;Available PRN/intermittently Type of Home: House Home Access: Stairs to enter Entrance Stairs-Rails: None Entrance Stairs-Number of Steps: 1 Home Layout: Two level;Able to live on main level with bedroom/bathroom Home Equipment: Dan Humphreys - 2 wheels;Wheelchair - manual (reports getting ) Additional Comments: typically gets bump up/down one step into home from family    Prior Function Level of Independence: Independent with assistive device(s)         Comments: Patient does not use her prosthesis (states she has been setting up therapy to start gait training?).  Uses w/c for mobility, and walker for bathroom.  Recent admission to CIR     Hand Dominance   Dominant Hand: Right    Extremity/Trunk Assessment               Lower Extremity Assessment: RLE deficits/detail;LLE deficits/detail RLE Deficits / Details: hx of BKA.  Able to move against gravity LLE Deficits / Details: grossly 3+/5 throughout, reports worsening numbness in L foot     Communication   Communication: No difficulties  Cognition Arousal/Alertness: Awake/alert Behavior During Therapy: WFL for tasks assessed/performed Overall Cognitive Status: Within Functional Limits  for tasks assessed                 General Comments: hx of ICH, appropriate during this session with simple commands    General Comments      Exercises     Assessment/Plan    PT Assessment Patient needs continued PT services  PT Problem List Decreased activity tolerance;Decreased balance;Decreased mobility;Impaired sensation;Decreased skin integrity;Decreased strength          PT Treatment Interventions DME instruction;Gait training;Functional mobility training;Therapeutic activities;Therapeutic exercise;Balance training;Patient/family  education;Wheelchair mobility training    PT Goals (Current goals can be found in the Care Plan section)  Acute Rehab PT Goals Patient Stated Goal: Pt stated she was never taught how to walk with prosthesis, hopeful for starting outpatient PT for prosthetic training PT Goal Formulation: With patient Time For Goal Achievement: 10/03/15 Potential to Achieve Goals: Good    Frequency Min 3X/week   Barriers to discharge        Co-evaluation               End of Session Equipment Utilized During Treatment: Gait belt Activity Tolerance: Patient tolerated treatment well Patient left: with call bell/phone within reach;in chair;with chair alarm set           Time: [id number] PT Time Calculation (min) (ACUTE ONLY): 22 min   Charges:   PT Evaluation $PT Eval Low Complexity: 1 Procedure     PT G Codes:        Deztiny Sarra,KATHrine E 09/26/2015, 3:50 PM Zenovia JarredKati Shep Porter, PT, DPT 09/26/2015 Pager: [phone]

## 2015-09-26 NOTE — Progress Notes (Signed)
PROGRESS NOTE  [name]  NTZ:[id number] DOB: [birth year] DOA: 09/24/2015 PCP: [name], MD  Brief Narrative:   The patient reportedly 50-year-old female with history of intracerebral hemorrhage s/p left craniotomy as teenager, diabetes mellitus type 2, hypertension, right BKA due to diabetic foot ulcer 07/2011, poor PO intake with had several admissions over the last 6 months. She was hospitalized in August due to acute on chronic kidney disease due to obstructive uropathy, severe anemia requiring blood transfusion.  She was seen by hematology who performed a bone marrow biopsy which was essentially normal.  She was given iron and aranesp.  She also had heme positive stools, however, endoscopy demonstrated on mild gastritis and hemorrhoids.  Her anemia was ultimately attributed to her CKD.  She had obstructive uropathy with bilateral hydronephrosis.  She was seen by nephrology and urology and had a foley catheter placed with improvement in her kidney function.  She was discharged with foley catheter.  Due to chronic nausea she had poor oral intake.  She was discharged from acute rehab on 09/18/2015 and reports that she has not been eating or drinking well since discharge.  She denies NSAID use.  She presented to the ER with dizziness and was found to be hypotensive, SBP in the 70s.  Her BP improved quickly with IVF.  She also had recurrent acute on chronic kidney disease and was admitted for further evaluation.    Assessment & Plan:   Principal Problem:   Acute worsening of stage 3 chronic kidney disease Active Problems:   Hypertension   Neuropathy (HCC)   Intracerebral hemorrhage (HCC)   Diabetes mellitus (HCC)   Thrombocytopenia (HCC)   UTI (lower urinary tract infection)   Hydronephrosis determined by ultrasound   S/P BKA (below knee amputation) unilateral (HCC)   Prolonged Q-T interval on ECG  Acute on chronic kidney disease stage 3, creatinine continuing to rise.  She has some  ATN from hypotension. May take a couple of days before her kidney function starts to improve.  Creatinine trending down. -  Continue Foley catheter -  Renally dose medications (gabapentin) -  Continue IVF -  FENa:  0.49%, prerenal -  Repeat US:  Resolution of hydronephrosis  Diarrhea, intermittent problem and chronic however recent hospitalization and antibiotic exposure -  C. difficile PCR  Possible UTI suggested by too numerous to count WBC, many bacteria, large leukocyte esterase -  Urine culture:  Greater than 100,000 colonies of gram-positive cocci -  Continue ceftriaxone  Diabetes mellitus type 2 with peripheral neuropathy, diet controlled.   -  Last A1c was 5.3 on 08/27/2015 -  Start SSI -  Renally dose gabapentin  Normocytic anemia due to chronic kidney disease -  Hemoglobin stable and improved since last admission -  Continue prn iron and aranesp to keep hgb near 72m/dl  Essential hypertension. Pressure stable Continue to hold all antihypertensive medication.  Hypokalemia, start oral potassium supplementation  Prolonged QTc 532 msec -  Avoid QTc prolonging medications  Thrombocytopenia, likely hemodilutional -  Repeat CBC in a.m.  Stage 3 sacral ulcer and draining perineal area. -  Wound care consult -  Warm compresses to draining area.    DVT prophylaxis:  Heparin Code Status:  Full code Family Communication:  Patient alone Disposition Plan:  Pending improvement in her kidney function, possibly home tomorrow   Consultants:   None  Procedures:  Renal ultrasound  Antimicrobials:   Ceftriaxone 9/20    Subjective: Patient is a difficult  historian secondary to her history of intracerebral hemorrhage.  2 watery stools followed by one stool that was putting like this morning. Ongoing nausea is mildly improved this morning.  Objective: Vitals:   09/25/15 1054 09/25/15 1410 09/25/15 2035 09/26/15 0534  BP: 128/70 115/67 137/63 127/76  Pulse: 85 85 91  86  Resp: _0 Temp: 98.3 F (36.8 C) 97.7 F (36.5 C) 97.7 F (36.5 C) 97.8 F (36.6 C)  TempSrc: Oral Oral Oral Oral  SpO2: 100% 100% 99% 100%  Weight:    89.6 kg (197 lb 9.6 oz)  Height:        Intake/Output Summary (Last 24 hours) at 09/26/15 1224 Last data filed at 09/26/15 0541  Gross per 24 hour  Intake          2798.33 ml  Output             3400 ml  Net          -601.67 ml   Filed Weights   09/24/15 2004 09/25/15 0555 09/26/15 0534  Weight: 82.5 kg (181 lb 14.1 oz) 83 kg (183 lb) 89.6 kg (197 lb 9.6 oz)    Examination:  General exam:  Adult Female.  No acute distress.  HEENT:  NCAT, MMM Respiratory system: Clear to auscultation bilaterally Cardiovascular system: Regular rate and rhythm, normal S1/S2. No murmurs, rubs, gallops or clicks.  Warm extremities Gastrointestinal system: Normal active bowel sounds, soft, nondistended, nontender. MSK:  Right BKA, stump wrapped in ACE bandage.  No left lower extremity edema Neuro:  Grossly moves all extremities.   Psych:  Tangential and timing of events she reports tends to get mixed up.  Cannot confidently answer ROS questions with Yes or No answers.   GU:  Pedunculated area on perineum about 3cm long with pinhole sized area with fresh blood oozing.  No fluctuance, warmth and minimally tender to palpation.      Data Reviewed: I have personally reviewed following labs and imaging studies  CBC:  Recent Labs Lab 09/24/15 1451 09/25/15 0600 09/26/15 0506  WBC 11.2* 9.9 6.1  HGB 10.5* 10.2* 10.2*  HCT 32.7* 31.6* 31.4*  MCV 84.7 84.5 82.8  PLT 195 171 283*   Basic Metabolic Panel:  Recent Labs Lab 09/24/15 1451 09/25/15 0600 09/26/15 0506  NA 132* 134* 137  K 3.8 3.3* 3.9  CL 98* 100* 106  CO2 _1 GLUCOSE 172* 119* 173*  BUN 25* 34* 41*  CREATININE 2.26* 2.68* 2.24*  CALCIUM 9.1 8.4* 8.2*  MG  --   --  1.6*  PHOS  --   --  5.3*   GFR: Estimated Creatinine Clearance: 35.3 mL/min (by C-G  formula based on SCr of 2.24 mg/dL (H)). Liver Function Tests:  Recent Labs Lab 09/25/15 0600 09/26/15 0506  AST 19  --   ALT 15  --   ALKPHOS 84  --   BILITOT 0.3  --   PROT 7.3  --   ALBUMIN 3.4* 3.4*   No results for input(s): LIPASE, AMYLASE in the last 168 hours. No results for input(s): AMMONIA in the last 168 hours. Coagulation Profile: No results for input(s): INR, PROTIME in the last 168 hours. Cardiac Enzymes: No results for input(s): CKTOTAL, CKMB, CKMBINDEX, TROPONINI in the last 168 hours. BNP (last 3 results) No results for input(s): PROBNP in the last 8760 hours. HbA1C: No results for input(s): HGBA1C in the last 72 hours. CBG:  Recent Labs Lab  09/24/15 1444 09/26/15 0816 09/26/15 1215  GLUCAP 168* 147* 186*   Lipid Profile: No results for input(s): CHOL, HDL, LDLCALC, TRIG, CHOLHDL, LDLDIRECT in the last 72 hours. Thyroid Function Tests: No results for input(s): TSH, T4TOTAL, FREET4, T3FREE, THYROIDAB in the last 72 hours. Anemia Panel: No results for input(s): VITAMINB12, FOLATE, FERRITIN, TIBC, IRON, RETICCTPCT in the last 72 hours. Urine analysis:    Component Value Date/Time   COLORURINE YELLOW 09/24/2015 1944   APPEARANCEUR TURBID (A) 09/24/2015 1944   LABSPEC 1.018 09/24/2015 1944   PHURINE 5.0 09/24/2015 1944   GLUCOSEU NEGATIVE 09/24/2015 1944   HGBUR LARGE (A) 09/24/2015 1944   BILIRUBINUR SMALL (A) 09/24/2015 1944   KETONESUR NEGATIVE 09/24/2015 1944   PROTEINUR >300 (A) 09/24/2015 1944   UROBILINOGEN 2.0 (H) 07/08/2011 0051   NITRITE NEGATIVE 09/24/2015 1944   LEUKOCYTESUR LARGE (A) 09/24/2015 1944   Sepsis Labs: _0 (procalcitonin:4,lacticidven:4)  ) Recent Results (from the past 240 hour(s))  Culture, blood (routine x 2)     Status: None   Collection Time: 09/16/15  1:44 PM  Result Value Ref Range Status   Specimen Description BLOOD RIGHT ARM  Final   Special Requests BOTTLES DRAWN AEROBIC ONLY  3CC  Final   Culture  NO GROWTH 5 DAYS  Final   Report Status 09/21/2015 FINAL  Final  Culture, blood (routine x 2)     Status: None   Collection Time: 09/16/15  1:52 PM  Result Value Ref Range Status   Specimen Description BLOOD RIGHT ARM  Final   Special Requests BOTTLES DRAWN AEROBIC ONLY  [place]  Final   Culture NO GROWTH 5 DAYS  Final   Report Status 09/21/2015 FINAL  Final  Urine culture     Status: None   Collection Time: 09/16/15  2:42 PM  Result Value Ref Range Status   Specimen Description URINE, CLEAN CATCH  Final   Special Requests NONE  Final   Culture NO GROWTH  Final   Report Status 09/17/2015 FINAL  Final  Culture, Urine     Status: Abnormal (Preliminary result)   Collection Time: 09/24/15  2:36 PM  Result Value Ref Range Status   Specimen Description URINE, CLEAN CATCH  Final   Special Requests NONE  Final   Culture >=100,000 COLONIES/mL GRAM POSITIVE COCCI (A)  Final   Report Status PENDING  Incomplete      Radiology Studies: Dg Chest 2 View  Result Date: 09/24/2015 CLINICAL DATA:  Generalized weakness and pain today. History of hypertension, diabetes, stroke. EXAM: CHEST  2 VIEW COMPARISON:  Chest radiograph September 16, 2015 FINDINGS: The cardiac silhouette is upper limits of normal in size, improved. Pulmonary vascular congestion without pleural effusion or focal consolidation. No pneumothorax. Soft tissue planes and included osseous structure nonsuspicious. IMPRESSION: Borderline cardiomegaly and pulmonary vascular congestion. Electronically Signed   By: Elon Alas M.D.   On: 09/24/2015 16:14   US Renal  Result Date: 09/25/2015 CLINICAL DATA:  Acute renal failure EXAM: RENAL / URINARY TRACT ULTRASOUND COMPLETE COMPARISON:  08/31/2015 FINDINGS: Right Kidney: Length: 11.1 cm. Echogenicity within normal limits. No mass or hydronephrosis visualized. Left Kidney: Length: 12.1 cm. Echogenicity within normal limits. No mass or hydronephrosis visualized. Bladder: Foley catheter in  place.  Bladder is decompressed. IMPRESSION: No acute findings.  No hydronephrosis. Electronically Signed   By: Rolm Baptise M.D.   On: 09/25/2015 08:57     Scheduled Meds: . cefTRIAXone (ROCEPHIN)  IV  1 g Intravenous Q24H  .  famotidine  20 mg Oral BID  . feeding supplement (PRO-STAT SUGAR FREE 64)  30 mL Oral TID  . ferrous sulfate  325 mg Oral BID WC  . gabapentin  300 mg Oral QHS  . heparin  5,000 Units Subcutaneous Q8H  . sodium chloride flush  3 mL Intravenous Q12H   Continuous Infusions: . sodium chloride 100 mL/hr at 09/25/15 2324     LOS: 1 day    Time spent: 30 min    Janece Canterbury, MD [facility] Pager [phone]  If 7PM-7AM, please contact night-coverage [email or web address] Password [facility] 09/26/2015, 12:24 PM

## 2015-09-26 NOTE — Progress Notes (Signed)
There are new orders for CBG testing. The CBGs are [facility].

## 2015-09-26 NOTE — Consult Note (Signed)
WOC Nurse wound consult note Reason for Consult:Patient seen by my partner, D. Engels on 08/25/15. Wound type:Multiple lesions of unknown etiology; patient with autoimmune disorder according to EMR and as noted by my partner could be vasculitis, scleroderma, calciphylaxis or a range of other etiologies. Pressure Ulcer POA: No Measurement:Current wounds are a healing full thickness injury measuring 0.8cm x 1.5cm x 0.2cm with an 80% red wound bed, 20% yellow slough and scant serous exudate.  There is a non-fluctuant, non-indurated area in the left perineum that has three pin-hole openings draining serosanguinous exudate in a scant amount. Wound bed:As described above Drainage (amount, consistency, odor) As described above Periwound:Dry, intact with evidence of previous other active lesions. Dressing procedure/placement/frequency:I am in agreement with the care plan initiated by Dr. Malachi BondsShort and will continue warm moist compresses to the perineal lesion without dressing.  The healing full thickness tissue injury will benefit from saline moistened gauze applied twice daily and covered with silicone foam dressings until discharged.  I have told patient that this leision should be covered while in acute care but that she may treat with soap and water cleansing at home and leave open to air as long as it is not draining. I have provided her with a pressure redistribution chair cushion today for her comfort while OOB in chair. WOC nursing team will not follow, but will remain available to this patient, the nursing and medical teams.  Please re-consult if needed. Thanks, Ladona MowLaurie Veta Dambrosia, MSN, RN, GNP, Hans EdenCWOCN, CWON-AP, FAAN  Pager# [phone]

## 2015-09-26 NOTE — Telephone Encounter (Signed)
Call received from patient's mother requesting later appt.  Per Dr. Truett PernaSherrill, he can see her at 11:30AM.  Pt.'s mother very appreciative of time change and message sent to scheduling.

## 2015-09-26 NOTE — Progress Notes (Signed)
Pt noted to have unstageable wound to left buttock as well as a firm, raised, draining area to right perineum. Area noted to be draining small amt of sanguinous drainage. Pt reports tenderness but no acute pain. Dr Malachi BondsShort aware and examined area with this Clinical research associatewriter.

## 2015-09-27 DIAGNOSIS — I4581 Long QT syndrome: Secondary | ICD-10-CM

## 2015-09-27 DIAGNOSIS — N1330 Unspecified hydronephrosis: Secondary | ICD-10-CM

## 2015-09-27 LAB — RENAL FUNCTION PANEL
Albumin: 3.2 g/dL — ABNORMAL LOW (ref 3.5–5.0)
Anion gap: 6 (ref 5–15)
BUN: 43 mg/dL — ABNORMAL HIGH (ref 6–20)
CO2: 22 mmol/L (ref 22–32)
Calcium: 8.3 mg/dL — ABNORMAL LOW (ref 8.9–10.3)
Chloride: 109 mmol/L (ref 101–111)
Creatinine, Ser: 1.87 mg/dL — ABNORMAL HIGH (ref 0.44–1.00)
GFR calc Af Amer: 36 mL/min — ABNORMAL LOW (ref >60)
GFR calc non Af Amer: 31 mL/min — ABNORMAL LOW (ref >60)
Glucose, Bld: 152 mg/dL — ABNORMAL HIGH (ref 65–99)
Phosphorus: 4.2 mg/dL (ref 2.5–4.6)
Potassium: 4.4 mmol/L (ref 3.5–5.1)
Sodium: 137 mmol/L (ref 135–145)

## 2015-09-27 LAB — GLUCOSE, CAPILLARY
Glucose-Capillary: 126 mg/dL — ABNORMAL HIGH (ref 65–99)
Glucose-Capillary: 179 mg/dL — ABNORMAL HIGH (ref 65–99)

## 2015-09-27 LAB — CBC
HCT: 29.7 % — ABNORMAL LOW (ref 36.0–46.0)
Hemoglobin: 9.5 g/dL — ABNORMAL LOW (ref 12.0–15.0)
MCH: 27.3 pg (ref 26.0–34.0)
MCHC: 32 g/dL (ref 30.0–36.0)
MCV: 85.3 fL (ref 78.0–100.0)
Platelets: 74 10*3/uL — ABNORMAL LOW (ref 150–400)
RBC: 3.48 MIL/uL — ABNORMAL LOW (ref 3.87–5.11)
RDW: 15.1 % (ref 11.5–15.5)
WBC: 5.8 10*3/uL (ref 4.0–10.5)

## 2015-09-27 LAB — URINE CULTURE: Culture: >=100000 — AB

## 2015-09-27 LAB — MAGNESIUM: Magnesium: 2.4 mg/dL (ref 1.7–2.4)

## 2015-09-27 MED ORDER — AMOXICILLIN-POT CLAVULANATE 875-125 MG PO TABS: 1.0000 | ORAL_TABLET | Freq: Two times a day (BID) | ORAL | 0 refills | Status: DC

## 2015-09-27 MED ORDER — GABAPENTIN 300 MG PO CAPS: 300.0000 mg | ORAL_CAPSULE | Freq: Every day | ORAL | 0 refills | Status: DC

## 2015-09-27 NOTE — Progress Notes (Signed)
D/C instructions reviewed w/ pt and mother. Both verbalize understanding and all questions answered. Pt d/c in w/c in stable condition by this Clinical research associatewriter to [employer]mother's car. Pt in possession of d/c instructions and all personal belongings at time of d/c. Aware of need to p/u script on way home.

## 2015-09-27 NOTE — Discharge Instructions (Signed)
Acute Kidney Injury °Acute kidney injury is any condition in which there is sudden (acute) damage to the kidneys. Acute kidney injury was previously known as acute kidney failure or acute renal failure. The kidneys are two organs that lie on either side of the spine between the middle of the back and the front of the abdomen. The kidneys: °· Remove wastes and extra water from the blood.   °· Produce important hormones. These help keep bones strong, regulate blood pressure, and help create red blood cells.   °· Balance the fluids and chemicals in the blood and tissues. °A small amount of kidney damage may not cause problems, but a large amount of damage may make it difficult or impossible for the kidneys to work the way they should. Acute kidney injury may develop into long-lasting (chronic) kidney disease. It may also develop into a life-threatening disease called end-stage kidney disease. Acute kidney injury can get worse very quickly, so it should be treated right away. Early treatment may prevent other kidney diseases from developing. °CAUSES  °· A problem with blood flow to the kidneys. This may be caused by:   °¨ Blood loss.   °¨ Heart disease.   °¨ Severe burns.   °¨ Liver disease. °· Direct damage to the kidneys. This may be caused by: °¨ Some medicines.   °¨ A kidney infection.   °¨ Poisoning or consuming toxic substances.   °¨ A surgical wound.   °¨ A blow to the kidney area.   °· A problem with urine flow. This may be caused by:   °¨ Cancer.   °¨ Kidney stones.   °¨ An enlarged prostate. °SIGNS AND SYMPTOMS  °· Swelling (edema) of the legs, ankles, or feet.   °· Tiredness (lethargy).   °· Nausea or vomiting.   °· Confusion.   °· Problems with urination, such as:   °¨ Painful or burning feeling during urination.   °¨ Decreased urine production.   °¨ Frequent accidents in children who are potty trained.   °¨ Bloody urine.   °· Muscle twitches and cramps.   °· Shortness of breath.   °· Seizures.   °· Chest  pain or pressure. °Sometimes, no symptoms are present.  °DIAGNOSIS °Acute kidney injury may be detected and diagnosed by tests, including blood, urine, imaging, or kidney biopsy tests.  °TREATMENT °Treatment of acute kidney injury varies depending on the cause and severity of the kidney damage. In mild cases, no treatment may be needed. The kidneys may heal on their own. If acute kidney injury is more severe, your health care provider will treat the cause of the kidney damage, help the kidneys heal, and prevent complications from occurring. Severe cases may require a procedure to remove toxic wastes from the body (dialysis) or surgery to repair kidney damage. Surgery may involve:  °· Repair of a torn kidney.   °· Removal of an obstruction. °HOME CARE INSTRUCTIONS °· Follow your prescribed diet. °· Take medicines only as directed by your health care provider.  °· Do not take any new medicines (prescription, over-the-counter, or nutritional supplements) unless approved by your health care provider. Many medicines can worsen your kidney damage or may need to have the dose adjusted.   °· Keep all follow-up visits as directed by your health care provider. This is important. °· Observe your condition to make sure you are healing as expected. °SEEK IMMEDIATE MEDICAL CARE IF: °· You are feeling ill or have severe pain in the back or side.   °· Your symptoms return or you have new symptoms. °· You have any symptoms of end-stage kidney disease. These include:   °¨ Persistent itchiness.   °¨   Loss of appetite.   °¨ Headaches.   °¨ Abnormally dark or light skin. °¨ Numbness in the hands or feet.   °¨ Easy bruising.   °¨ Frequent hiccups.   °¨ Menstruation stops.   °· You have a fever. °· You have increased urine production. °· You have pain or bleeding when urinating. °MAKE SURE YOU:  °· Understand these instructions. °· Will watch your condition. °· Will get help right away if you are not doing well or get worse. °  °This  information is not intended to replace advice given to you by your health care provider. Make sure you discuss any questions you have with your health care provider. °  °Document Released: 07/06/2010 Document Revised: 01/11/2014 Document Reviewed: 08/20/2011 °Elsevier Interactive Patient Education ©2016 [employer] ° °

## 2015-09-27 NOTE — Progress Notes (Signed)
Verified that patient set up with [facility] and informed rep of patient's discharge today.  No other CM needs at this time.

## 2015-09-27 NOTE — Discharge Summary (Addendum)
Physician Discharge Summary  [name] SWN:[id number] DOB: 12-Dec-1965 DOA: 09/24/2015  PCP: Cheryl Blackbird, MD  Admit date: 09/24/2015 Discharge date: 09/27/2015  Admitted From: home  Disposition:  Home  Recommendations for Outpatient Follow-up:  1. Follow up with PCP in 1-2 weeks 2. Please obtain BMP/CBC in 1 week 3. Dr. Benay Wolf, Oncology on Tuesday 4. Urology in 1 week for urinary retention.  Home Health:  [facility] RN Equipment/Devices:  foley  Discharge Condition:  Stable, improved CODE STATUS:  full  Diet recommendation:  Healthy heart   Brief/Interim Summary:  The patient reportedly 50 year old female with history of intracerebral hemorrhage s/p left craniotomy as teenager, diabetes mellitus type 2, hypertension, right BKA due to diabetic foot ulcer 07/2011, poor PO intake with had several admissions over the last 6 months. She was hospitalized in August due to acute on chronic kidney disease due to obstructive uropathy, severe anemia requiring blood transfusion.  She was seen by hematology who performed a bone marrow biopsy which was essentially normal.  She was given iron and aranesp.  She also had heme positive stools, however, endoscopy demonstrated on mild gastritis and hemorrhoids.  Her anemia was ultimately attributed to her CKD.  She had obstructive uropathy with bilateral hydronephrosis.  She was seen by nephrology and urology and had a foley catheter placed with improvement in her kidney function.  She was discharged with foley catheter.  Due to chronic nausea she had poor oral intake.  She was discharged from acute rehab on 09/18/2015 and reports that she has not been eating or drinking well since discharge.  She denied NSAID use.  She presented to the ER with dizziness and was found to be hypotensive, SBP in the 70s.  Her BP improved quickly with IVF.  She also had recurrent acute on chronic kidney disease and was admitted for further evaluation.  Her kidney function improved  with IVF.  She had some transient diarrhea that resolved spontaneously.  Recommended that she drink at least 1.5 to 2L daily.    Discharge Diagnoses:  Principal Problem:   Acute worsening of stage 3 chronic kidney disease Active Problems:   Hypertension   Neuropathy (HCC)   Intracerebral hemorrhage (HCC)   Diabetes mellitus (HCC)   Thrombocytopenia (HCC)   UTI (lower urinary tract infection)   Hydronephrosis determined by ultrasound   S/P BKA (below knee amputation) unilateral (HCC)   Prolonged Q-T interval on ECG  Acute on chronic kidney disease stage 3, creatinine continuing to rise.  She has some ATN from hypotension. May take a couple of days before her kidney function starts to improve.  Creatinine trended down with IVF. -  Continued Foley catheter -  Renally dosed medication gabapentin -  FENa:  0.49%, prerenal -  Repeat US:  Resolution of hydronephrosis -  Recommend repeat BMP next week during one of her follow up appointments to ensure that her creatinine has continued to improve  Diarrhea, intermittent problem and chronic however recent hospitalization and antibiotic exposure -  C. difficile PCR was unable to be obtained because her diarrhea self-resolved  Enterococcus CAUTI suggested by too numerous to count WBC, many bacteria, large leukocyte esterase, present at time of admission  Given augmentin.  F/u with Urology.  Discharged with foley catheter.   Diabetes mellitus type 2 with peripheral neuropathy, diet controlled.   -  Last A1c was 5.3 on 08/27/2015 -  Start SSI -  Renally dose gabapentin  Normocytic anemia due to chronic kidney disease -  Hemoglobin stable and improved since last admission -  Continue prn iron and aranesp to keep hgb near '10mg'$ /dl  Essential hypertension. Pressure stable  Hypokalemia, start oral potassium supplementation  Prolonged QTc 532 msec -  Avoid QTc prolonging medications  Normocytic anemia likely due to kidney injury and  possible mild blood loss.  Being worked up for this problem by Oncology.  Follow up with Dr. Benay Wolf on Tuesday at already scheduled appointment.  Suspect drop in hemoglobin was hemodilutional as her hemoglobin is nearing her level from her previous hospitalization at the time of discharge.    Thrombocytopenia, recurred but this is going to be follow up by Dr. Benay Wolf next week.  Drop in platelets was likely hemodilutional and she is likely nearing her "baseline."  Stage 3 sacral ulcer and draining perineal area. -  Wound care consulted who recommended continue warm moist compresses to the perineal lesion without dressing.  Full thickness injury on sacrum:  soap and water cleansing at home and leave open to air as long as it is not draining  Discharge Instructions  Discharge Instructions    Call MD for:  difficulty breathing, headache or visual disturbances    Complete by:  As directed    Call MD for:  extreme fatigue    Complete by:  As directed    Call MD for:  hives    Complete by:  As directed    Call MD for:  persistant dizziness or light-headedness    Complete by:  As directed    Call MD for:  persistant nausea and vomiting    Complete by:  As directed    Call MD for:  severe uncontrolled pain    Complete by:  As directed    Call MD for:  temperature >100.4    Complete by:  As directed    Diet - low sodium heart healthy    Complete by:  As directed    Discharge instructions    Complete by:  As directed    Please drink at least 1.5L to 2L of water, milk, or other fluids each day to prevent dehydration.  Please do not take naprosyn, aleve, ibuprofen, aspirin.  Tylenol is the only over the counter pain medication that is safe.   Increase activity slowly    Complete by:  As directed        Medication List    STOP taking these medications   carvedilol 6.25 MG tablet Commonly known as:  COREG     TAKE these medications   acetaminophen 500 MG tablet Commonly known as:   TYLENOL Take 1,000 mg by mouth every 6 (six) hours as needed for moderate pain.   amoxicillin-clavulanate 875-125 MG tablet Commonly known as:  AUGMENTIN Take 1 tablet by mouth 2 (two) times daily.   B-complex with vitamin C tablet Take 1 tablet by mouth daily.   calcium carbonate 500 MG chewable tablet Commonly known as:  TUMS - dosed in mg elemental calcium Chew 2 tablets (400 mg of elemental calcium total) by mouth 3 (three) times daily.   CLEAR EYES OP Place 1 drop into both eyes daily as needed (dry eyes/itching).   famotidine 20 MG tablet Commonly known as:  PEPCID Take 1 tablet (20 mg total) by mouth 2 (two) times daily.   ferrous sulfate 325 (65 FE) MG tablet Take 1 tablet (325 mg total) by mouth 2 (two) times daily with a meal.   gabapentin 300 MG capsule Commonly known as:  NEURONTIN Take 1 capsule (300 mg total) by mouth at bedtime. What changed:  when to take this   HYDROcodone-acetaminophen 5-325 MG tablet Commonly known as:  NORCO/VICODIN Take 0.5-1 tablets by mouth daily as needed for severe pain.   ICY HOT EX Apply 1 application topically 2 (two) times daily as needed (pain).   prochlorperazine 5 MG tablet Commonly known as:  COMPAZINE Take 1-2 tablets (5-10 mg total) by mouth every 8 (eight) hours as needed for nausea.      Follow-up Information    FULP, CAMMIE, MD .   Specialty:  Family Medicine Why:    Contact information: [address] [address] [phone]        Betsy Coder, MD Follow up on 09/30/2015.   Specialty:  Oncology Why:  11 AM Contact information: [place] [address] [phone]          No Known Allergies  Consultations: none   Procedures/Studies: Ct Abdomen Pelvis Wo Contrast  Result Date: 08/29/2015 CLINICAL DATA:  Stool from vagina. EXAM: CT ABDOMEN AND PELVIS WITHOUT CONTRAST TECHNIQUE: Multidetector CT imaging of the abdomen and pelvis was performed following the  standard protocol without IV contrast. COMPARISON:  Renal ultrasound 08/23/2015 FINDINGS: Lower chest and abdominal wall:  Body wall edema Trace layering pleural effusions. Interstitial opacities in the basilar lungs. Cardiomegaly and trace pericardial fluid Hepatobiliary: No focal liver abnormality.No evidence of biliary obstruction or stone. Pancreas: Unremarkable. Spleen: Upper limits of normal size. Adrenals/Urinary Tract: Negative adrenals. Bilateral hydroureteronephrosis to the level of the bladder, also seen on comparison sonogram. Interval decompression of the bladder with Foley catheter. Bladder wall is thickened and indistinct, as seen with cystitis. No visible pelvic mass or other certain explanation for ureteral obstruction. Stomach/Bowel: Oral contrast was provided and is seen to the level of the distal transverse colon. No contrast at the level of the rectum which is in close proximity to the vagina. Rectovaginal fistula cannot be excluded on this exam. No bowel obstruction. No appendicitis. Reproductive:Cannot exclude rectovaginal fistula as above. Nonspecific stranding about the bladder and uterus. Vascular/Lymphatic: No acute vascular abnormality. Mild reactive appearing enlargement of bilateral inguinal lymph nodes. Other: No ascites or pneumoperitoneum Musculoskeletal: No acute abnormalities. IMPRESSION: 1. Oral contrast did not reach the rectum and a clear rectovaginal fistula is not demonstrated. Preferably would repeat pelvis CT with contrast per rectum. 2. Persisting bilateral hydroureteronephrosis despite Foley catheter decompression of the inflamed bladder. There is inflammatory changes in the pelvis but no discrete obstructing process seen. 3. Cardiomegaly with trace effusions and basilar atelectasis or edema. Electronically Signed   By: Monte Fantasia M.D.   On: 08/29/2015 00:39   Dg Chest 1 View  Result Date: 09/16/2015 CLINICAL DATA:  Fever.  Inpatient. EXAM: CHEST 1 VIEW  COMPARISON:  07/07/2011 chest radiograph. FINDINGS: Stable cardiomediastinal silhouette with mild cardiomegaly. No pneumothorax. No pleural effusion. Low lung volumes. No overt pulmonary edema. Hazy left lung base opacity. IMPRESSION: 1. Stable mild cardiomegaly without overt pulmonary edema. 2. Low lung volumes with hazy left lung base opacity, favor atelectasis. Electronically Signed   By: Ilona Sorrel M.D.   On: 09/16/2015 11:01   Dg Chest 2 View  Result Date: 09/24/2015 CLINICAL DATA:  Generalized weakness and pain today. History of hypertension, diabetes, stroke. EXAM: CHEST  2 VIEW COMPARISON:  Chest radiograph September 16, 2015 FINDINGS: The cardiac silhouette is upper limits of normal in size, improved. Pulmonary vascular congestion without pleural effusion or focal  consolidation. No pneumothorax. Soft tissue planes and included osseous structure nonsuspicious. IMPRESSION: Borderline cardiomegaly and pulmonary vascular congestion. Electronically Signed   By: Elon Alas M.D.   On: 09/24/2015 16:14   Ct Pelvis Wo Contrast  Result Date: 08/30/2015 CLINICAL DATA:  Evaluate rectovaginal fistula. EXAM: CT PELVIS WITHOUT CONTRAST TECHNIQUE: Multidetector CT imaging of the pelvis was performed following the standard protocol without intravenous contrast. COMPARISON:  None. FINDINGS: Radiopaque material has been instilled in a retrograde fashion into the rectum and distal sigmoid colon. A balloon tipped catheter is seen within the rectum. No extraluminal contrast opacification is seen to suggest rectovaginal fistula. Foley catheter is in place. There is partially visualized left hydronephrosis. There is bilateral moderate hydroureter. The pelvic organs are poorly visualized due to lack of IV contrast. There is diffuse anasarca. Moderately enlarged bilateral inguinal lymph nodes are seen. IMPRESSION: No evidence of rectovaginal fistula. Poor visualization of the pelvic organs due to lack of IV  contrast. Diffuse anasarca. Bilateral hydroureter and partially visualized left hydronephrosis. Moderately enlarged bilateral inguinal lymph nodes. Electronically Signed   By: Fidela Salisbury M.D.   On: 08/30/2015 19:49   US Renal  Result Date: 09/25/2015 CLINICAL DATA:  Acute renal failure EXAM: RENAL / URINARY TRACT ULTRASOUND COMPLETE COMPARISON:  08/31/2015 FINDINGS: Right Kidney: Length: 11.1 cm. Echogenicity within normal limits. No mass or hydronephrosis visualized. Left Kidney: Length: 12.1 cm. Echogenicity within normal limits. No mass or hydronephrosis visualized. Bladder: Foley catheter in place.  Bladder is decompressed. IMPRESSION: No acute findings.  No hydronephrosis. Electronically Signed   By: Rolm Baptise M.D.   On: 09/25/2015 08:57   US Renal  Result Date: 08/31/2015 CLINICAL DATA:  Acute renal failure. EXAM: RENAL / URINARY TRACT ULTRASOUND COMPLETE COMPARISON:  CT of the abdomen pelvis 08/29/2015 FINDINGS: Right Kidney: Length: 12.8 cm.  There is moderate hydronephrosis. Left Kidney: Length: 12.2 cm.  There is moderate hydronephrosis. Bladder: Urinary bladder is poorly visualized as it is decompressed around urinary Foley catheter. IMPRESSION: Bilateral moderate hydronephrosis without evidence of cortical thinning. Electronically Signed   By: Fidela Salisbury M.D.   On: 08/31/2015 14:49    Subjective: Feeling much better and asking to go home.    Discharge Exam: Vitals:   09/26/15 2214 09/27/15 0646  BP: 120/64 137/65  Pulse: 91 90  Resp: 18 18  Temp: 98.1 F (36.7 C) 98.1 F (36.7 C)   Vitals:   09/26/15 0534 09/26/15 1350 09/26/15 2214 09/27/15 0646  BP: 127/76 125/72 120/64 137/65  Pulse: 86 82 91 90  Resp: _0 Temp: 97.8 F (36.6 C) 98.7 F (37.1 C) 98.1 F (36.7 C) 98.1 F (36.7 C)  TempSrc: Oral Oral Oral Oral  SpO2: 100% 98%  100%  Weight: 89.6 kg (197 lb 9.6 oz)   90.3 kg (199 lb 1.6 oz)  Height:        General exam:  Adult Female.   No acute distress.  HEENT:  NCAT, MMM Respiratory system: Clear to auscultation bilaterally Cardiovascular system: Regular rate and rhythm, normal S1/S2. No murmurs, rubs, gallops or clicks.  Warm extremities Gastrointestinal system: Normal active bowel sounds, soft, nondistended, nontender. MSK:  Right BKA, stump wrapped in ACE bandage.  No left lower extremity edema Neuro:  Grossly moves all extremities.   Psych:  Tangential and timing of events she reports tends to get mixed up.  Cannot confidently answer ROS questions with Yes or No answers.   GU:  Pedunculated area on  perineum about 3cm long with pinhole sized area with fresh blood oozing.  No fluctuance, warmth and minimally tender to palpation.      The results of significant diagnostics from this hospitalization (including imaging, microbiology, ancillary and laboratory) are listed below for reference.     Microbiology: Recent Results (from the past 240 hour(s))  Culture, Urine     Status: Abnormal   Collection Time: 09/24/15  2:36 PM  Result Value Ref Range Status   Specimen Description URINE, CLEAN CATCH  Final   Special Requests NONE  Final   Culture >=100,000 COLONIES/mL ENTEROCOCCUS FAECALIS (A)  Final   Report Status 09/27/2015 FINAL  Final   Organism ID, Bacteria ENTEROCOCCUS FAECALIS (A)  Final      Susceptibility   Enterococcus faecalis - MIC*    AMPICILLIN <=2 SENSITIVE Sensitive     LEVOFLOXACIN 2 SENSITIVE Sensitive     NITROFURANTOIN <=16 SENSITIVE Sensitive     VANCOMYCIN 1 SENSITIVE Sensitive     * >=100,000 COLONIES/mL ENTEROCOCCUS FAECALIS     Labs: BNP (last 3 results)  Recent Labs  08/23/15 1407  BNP 209.4*   Basic Metabolic Panel:  Recent Labs Lab 09/24/15 1451 09/25/15 0600 09/26/15 0506 09/27/15 0543  NA 132* 134* 137 137  K 3.8 3.3* 3.9 4.4  CL 98* 100* 106 109  CO2 _0 GLUCOSE 172* 119* 173* 152*  BUN 25* 34* 41* 43*  CREATININE 2.26* 2.68* 2.24* 1.87*  CALCIUM 9.1 8.4*  8.2* 8.3*  MG  --   --  1.6* 2.4  PHOS  --   --  5.3* 4.2   Liver Function Tests:  Recent Labs Lab 09/25/15 0600 09/26/15 0506 09/27/15 0543  AST 19  --   --   ALT 15  --   --   ALKPHOS 84  --   --   BILITOT 0.3  --   --   PROT 7.3  --   --   ALBUMIN 3.4* 3.4* 3.2*   No results for input(s): LIPASE, AMYLASE in the last 168 hours. No results for input(s): AMMONIA in the last 168 hours. CBC:  Recent Labs Lab 09/24/15 1451 09/25/15 0600 09/26/15 0506 09/27/15 0543  WBC 11.2* 9.9 6.1 5.8  HGB 10.5* 10.2* 10.2* 9.5*  HCT 32.7* 31.6* 31.4* 29.7*  MCV 84.7 84.5 82.8 85.3  PLT 195 171 112* 74*   Cardiac Enzymes: No results for input(s): CKTOTAL, CKMB, CKMBINDEX, TROPONINI in the last 168 hours. BNP: Invalid input(s): POCBNP CBG:  Recent Labs Lab 09/26/15 0816 09/26/15 1215 09/26/15 1725 09/26/15 2211 09/27/15 0729  GLUCAP 147* 186* 215* 145* 126*   D-Dimer No results for input(s): DDIMER in the last 72 hours. Hgb A1c No results for input(s): HGBA1C in the last 72 hours. Lipid Profile No results for input(s): CHOL, HDL, LDLCALC, TRIG, CHOLHDL, LDLDIRECT in the last 72 hours. Thyroid function studies No results for input(s): TSH, T4TOTAL, T3FREE, THYROIDAB in the last 72 hours.  Invalid input(s): FREET3 Anemia work up No results for input(s): VITAMINB12, FOLATE, FERRITIN, TIBC, IRON, RETICCTPCT in the last 72 hours. Urinalysis    Component Value Date/Time   COLORURINE YELLOW 09/24/2015 1944   APPEARANCEUR TURBID (A) 09/24/2015 1944   LABSPEC 1.018 09/24/2015 1944   PHURINE 5.0 09/24/2015 1944   GLUCOSEU NEGATIVE 09/24/2015 1944   HGBUR LARGE (A) 09/24/2015 1944   BILIRUBINUR SMALL (A) 09/24/2015 1944   KETONESUR NEGATIVE 09/24/2015 1944   PROTEINUR >300 (A) 09/24/2015 1944  UROBILINOGEN 2.0 (H) 07/08/2011 0051   NITRITE NEGATIVE 09/24/2015 1944   LEUKOCYTESUR LARGE (A) 09/24/2015 1944   Sepsis Labs Invalid input(s): PROCALCITONIN,  WBC,   LACTICIDVEN   Time coordinating discharge: Over 30 minutes  SIGNED:   Janece Canterbury, MD  [facility] 09/27/2015, 8:41 AM Pager   If 7PM-7AM, please contact night-coverage [email or web address] Password [email or web address]

## 2015-09-30 ENCOUNTER — Other Ambulatory Visit: Payer: Self-pay

## 2015-09-30 ENCOUNTER — Other Ambulatory Visit (HOSPITAL_BASED_OUTPATIENT_CLINIC_OR_DEPARTMENT_OTHER): Payer: Self-pay

## 2015-09-30 ENCOUNTER — Telehealth: Payer: Self-pay | Admitting: Oncology

## 2015-09-30 ENCOUNTER — Ambulatory Visit (HOSPITAL_BASED_OUTPATIENT_CLINIC_OR_DEPARTMENT_OTHER): Payer: Self-pay | Admitting: Oncology

## 2015-09-30 VITALS — BP 146/71 | HR 99 | Temp 98.5°F | Resp 18 | Wt 194.7 lb

## 2015-09-30 DIAGNOSIS — D696 Thrombocytopenia, unspecified: Secondary | ICD-10-CM

## 2015-09-30 LAB — CBC WITH DIFFERENTIAL/PLATELET
BASO%: 0.3 % (ref 0.0–2.0)
Basophils Absolute: 0 10*3/uL (ref 0.0–0.1)
EOS%: 3.6 % (ref 0.0–7.0)
Eosinophils Absolute: 0.3 10*3/uL (ref 0.0–0.5)
HCT: 31.6 % — ABNORMAL LOW (ref 34.8–46.6)
HGB: 10.3 g/dL — ABNORMAL LOW (ref 11.6–15.9)
LYMPH%: 21.1 % (ref 14.0–49.7)
MCH: 27.5 pg (ref 25.1–34.0)
MCHC: 32.6 g/dL (ref 31.5–36.0)
MCV: 84.3 fL (ref 79.5–101.0)
MONO#: 0.3 10*3/uL (ref 0.1–0.9)
MONO%: 4.6 % (ref 0.0–14.0)
NEUT#: 5 10*3/uL (ref 1.5–6.5)
NEUT%: 70.4 % (ref 38.4–76.8)
Platelets: 57 10*3/uL — ABNORMAL LOW (ref 145–400)
RBC: 3.75 10*6/uL (ref 3.70–5.45)
RDW: 15.1 % — ABNORMAL HIGH (ref 11.2–14.5)
WBC: 7 10*3/uL (ref 3.9–10.3)
lymph#: 1.5 10*3/uL (ref 0.9–3.3)
nRBC: 0 % (ref 0–0)

## 2015-09-30 LAB — BASIC METABOLIC PANEL
Anion Gap: 11 mEq/L (ref 3–11)
BUN: 22.3 mg/dL (ref 7.0–26.0)
CO2: 19 mEq/L — ABNORMAL LOW (ref 22–29)
Calcium: 9.6 mg/dL (ref 8.4–10.4)
Chloride: 108 mEq/L (ref 98–109)
Creatinine: 1.3 mg/dL — ABNORMAL HIGH (ref 0.6–1.1)
EGFR: 54 mL/min/{1.73_m2} — ABNORMAL LOW (ref >90)
Glucose: 200 mg/dl — ABNORMAL HIGH (ref 70–140)
Potassium: 4.6 mEq/L (ref 3.5–5.1)
Sodium: 138 mEq/L (ref 136–145)

## 2015-09-30 NOTE — Telephone Encounter (Signed)
Gv pt appt for 10/18 and sent back to lab today.

## 2015-09-30 NOTE — Progress Notes (Signed)
  [facility] OFFICE PROGRESS NOTE   Diagnosis: Anemia, thrombocytopenia  INTERVAL HISTORY:   I saw [name] when she was hospitalized last month with severe anemia and thrombocytopenia. She had severe renal failure secondary to urinary retention. A Foley catheter remains in place. [name] underwent an extensive for the anemia and thrombocytopenia including a nondiagnostic bone marrow biopsy. The platelet count improved while she was in the hospital. She underwent treatment for a pustular skin rash and yeast infection. These improved. She spent time on the rehabilitation service and was discharged to home on 09/18/2015. She was readmitted on 09/24/2015 with hypotension. She was noted to have worsening renal failure. The hypotension and renal failure improved with intravenous hydration. She was discharged on 09/27/2015.  She is followed by home nursing for care of decubitus ulcers. She reports the skin rash has improved area and no specific complaint today. Objective:  Vital signs in last 24 hours:  There were no vitals taken for this visit.    HEENT: No thrush or ulcers Resp: Lungs clear bilaterally Cardio: Regular rate and rhythm GI: No hepatosplenomegaly Vascular: No left leg edema  Skin: Resolved [place] rash at the anterior chest, multiple hyperpigmented raised dry lesions over the extremities     Lab Results:  Lab Results  Component Value Date   WBC 7.0 09/30/2015   HGB 10.3 (L) 09/30/2015   HCT 31.6 (L) 09/30/2015   MCV 84.3 09/30/2015   PLT 57 Few Large platelets present (L) 09/30/2015   NEUTROABS 5.0 09/30/2015    BUN 23.3, potassium 4.6, creatinine 1.3, glucose 200  Medications: I have reviewed the patient's current medications.  Assessment/Plan:  1.Severe anemia and thrombocytopenia-improved following multiple transfusions  Bone marrow biopsy 08/25/2015-no evidence of a hematopoietic malignancy, granulomatous disease, or metastatic  carcinoma. Hypercellular marrow  Persistent thrombocytopenia 2. Renal failure/bilateral hydronephrosis, urinary retention-creatinine improved   Etiology of urinary retention not established, potentially related to Diabetic neuropathy 3. Pustular/ulcerated skin lesions/decubitus ulcers-Biopsy from the left breast and left thigh revealed epidermal hyperplasia with fibrosing granulation tissue 4. Yeast rash beneath the breasts and in the groin-resolved 5. Diabetes 6. Status post right below-knee amputation in 2012 7. History of "neuropathy " 8. Coagulopathy-Resolved following vitamin K 9. Deconditioning    Disposition:  [name] appears well. The hemoglobin is stable and the renal function continues to improve. She will follow-up with urology for management of the Foley catheter and evaluation of bladder emptying.  She has moderate thrombocytopenia today. The platelet count has varied over the past few weeks. The etiology of the thrombocytopenia remains unclear. The thrombocytopenia may be related to medications or chronic ITP. She knows to contact us for spontaneous bruising or bleeding.  [name] will return for an office visit and CBC on 10/23/2015.  She will continue follow-up with home nursing for wound care. She will schedule an appointment with Dr. Chapman Fitch for management of diabetes.  Betsy Coder, MD  09/30/2015  4:34 PM

## 2015-10-01 ENCOUNTER — Encounter: Payer: Self-pay | Admitting: Physical Medicine & Rehabilitation

## 2015-10-01 ENCOUNTER — Encounter: Payer: Medicaid Other | Attending: Physical Medicine & Rehabilitation | Admitting: Physical Medicine & Rehabilitation

## 2015-10-01 VITALS — BP 124/73 | HR 97 | Resp 14

## 2015-10-01 DIAGNOSIS — Z91199 Patient's noncompliance with other medical treatment and regimen due to unspecified reason: Secondary | ICD-10-CM

## 2015-10-01 DIAGNOSIS — N189 Chronic kidney disease, unspecified: Secondary | ICD-10-CM | POA: Insufficient documentation

## 2015-10-01 DIAGNOSIS — M1990 Unspecified osteoarthritis, unspecified site: Secondary | ICD-10-CM | POA: Insufficient documentation

## 2015-10-01 DIAGNOSIS — E1165 Type 2 diabetes mellitus with hyperglycemia: Secondary | ICD-10-CM

## 2015-10-01 DIAGNOSIS — E11621 Type 2 diabetes mellitus with foot ulcer: Secondary | ICD-10-CM | POA: Diagnosis not present

## 2015-10-01 DIAGNOSIS — I129 Hypertensive chronic kidney disease with stage 1 through stage 4 chronic kidney disease, or unspecified chronic kidney disease: Secondary | ICD-10-CM | POA: Insufficient documentation

## 2015-10-01 DIAGNOSIS — D62 Acute posthemorrhagic anemia: Secondary | ICD-10-CM | POA: Diagnosis not present

## 2015-10-01 DIAGNOSIS — Z9889 Other specified postprocedural states: Secondary | ICD-10-CM | POA: Insufficient documentation

## 2015-10-01 DIAGNOSIS — Z8673 Personal history of transient ischemic attack (TIA), and cerebral infarction without residual deficits: Secondary | ICD-10-CM | POA: Diagnosis not present

## 2015-10-01 DIAGNOSIS — I999 Unspecified disorder of circulatory system: Secondary | ICD-10-CM

## 2015-10-01 DIAGNOSIS — N1339 Other hydronephrosis: Secondary | ICD-10-CM

## 2015-10-01 DIAGNOSIS — R5381 Other malaise: Secondary | ICD-10-CM

## 2015-10-01 DIAGNOSIS — D696 Thrombocytopenia, unspecified: Secondary | ICD-10-CM | POA: Diagnosis not present

## 2015-10-01 DIAGNOSIS — N319 Neuromuscular dysfunction of bladder, unspecified: Secondary | ICD-10-CM

## 2015-10-01 DIAGNOSIS — I10 Essential (primary) hypertension: Secondary | ICD-10-CM

## 2015-10-01 DIAGNOSIS — Z89511 Acquired absence of right leg below knee: Secondary | ICD-10-CM | POA: Diagnosis not present

## 2015-10-01 DIAGNOSIS — E1121 Type 2 diabetes mellitus with diabetic nephropathy: Secondary | ICD-10-CM

## 2015-10-01 DIAGNOSIS — N1330 Unspecified hydronephrosis: Secondary | ICD-10-CM | POA: Diagnosis not present

## 2015-10-01 DIAGNOSIS — G8929 Other chronic pain: Secondary | ICD-10-CM | POA: Diagnosis not present

## 2015-10-01 DIAGNOSIS — E1122 Type 2 diabetes mellitus with diabetic chronic kidney disease: Secondary | ICD-10-CM | POA: Diagnosis not present

## 2015-10-01 DIAGNOSIS — N179 Acute kidney failure, unspecified: Secondary | ICD-10-CM | POA: Diagnosis not present

## 2015-10-01 DIAGNOSIS — E1143 Type 2 diabetes mellitus with diabetic autonomic (poly)neuropathy: Secondary | ICD-10-CM | POA: Diagnosis not present

## 2015-10-01 DIAGNOSIS — E1142 Type 2 diabetes mellitus with diabetic polyneuropathy: Secondary | ICD-10-CM

## 2015-10-01 DIAGNOSIS — R339 Retention of urine, unspecified: Secondary | ICD-10-CM

## 2015-10-01 DIAGNOSIS — Z9119 Patient's noncompliance with other medical treatment and regimen: Secondary | ICD-10-CM

## 2015-10-01 DIAGNOSIS — N183 Chronic kidney disease, stage 3 (moderate): Secondary | ICD-10-CM

## 2015-10-01 DIAGNOSIS — G629 Polyneuropathy, unspecified: Secondary | ICD-10-CM | POA: Insufficient documentation

## 2015-10-01 DIAGNOSIS — IMO0002 Reserved for concepts with insufficient information to code with codable children: Secondary | ICD-10-CM

## 2015-10-01 MED ORDER — GABAPENTIN 100 MG PO CAPS: 100.0000 mg | ORAL_CAPSULE | Freq: Three times a day (TID) | ORAL | 1 refills | Status: DC

## 2015-10-01 NOTE — Progress Notes (Signed)
Subjective:    Patient ID: [name], female    DOB: [birth year], 50 y.o.   MRN: [id number]  HPI  50 year old female with history of intracerebral hemorrhage s/p left craniotomy as teenager, diabetes mellitus type 2, hypertension, right BKA due to diabetic foot ulcer 07/2011, several hospital admissions presents for hospital follow up after receiving CIR for multiple medical issues. Pt is very poor historian.  Her prosthesis was adjusted, but she is not wearing it.  Pt had drop in BP and was admitted to [facility].  She states she Gabapentin was decreased, but that caused her increase in pain. Her ulcer is improving.  Her nausea has resolved. Her CBGs have been in the 120s. Pt continues to use indwelling foley.  Pt has an appointment with Urology, which she hasn't seen.  She rescheduled the appointment.  She has not seen her PCP.  Pt saw oncology.  She had her labs drawn yesterday.     Pain Inventory Average Pain 3 Pain Right Now 3 My pain is sharp  In the last 24 hours, has pain interfered with the following? General activity 0 Relation with others 0 Enjoyment of life 0 What TIME of day is your pain at its worst? all Sleep (in general) Fair  Pain is worse with: sitting, inactivity and some activites Pain improves with: medication Relief from Meds: 0  Mobility use a wheelchair transfers alone  Function disabled: date disabled . I need assistance with the following:  household duties  Neuro/Psych bladder control problems tingling trouble walking  Prior Studies hospital f/u  Physicians involved in your care hospital f/u   Family History  Problem Relation Age of Onset  . Diabetes Mother   . Hypertension Mother   . Hyperlipidemia Mother   . Diabetes Father   . Cancer Father     prostate  . Hyperlipidemia Father   . Hypertension Father   . Diabetes Brother   . Peripheral Artery Disease Brother     has had toes amputated   Social History   Social History   . Marital status: Married    Spouse name: N/A  . Number of children: N/A  . Years of education: N/A   Social History Main Topics  . Smoking status: Never Smoker  . Smokeless tobacco: Never Used  . Alcohol use No  . Drug use: No  . Sexual activity: Not Asked   Other Topics Concern  . None   Social History Narrative   Has a son and daughter    Past Surgical History:  Procedure Laterality Date  . AMPUTATION  11/24/2010   Procedure: AMPUTATION FOOT;  Surgeon: [name], MD;  Location: [facility] OR;  Service: Orthopedics;  Laterality: Right;  Right  FOOT TRANS METATARSAL AMPUTATION  . AMPUTATION  07/02/2011   Procedure: AMPUTATION BELOW KNEE;  Surgeon: [name], MD;  Location: [facility] OR;  Service: Orthopedics;  Laterality: Right;  . APPLICATION OF WOUND VAC  07/02/2011   Procedure: APPLICATION OF WOUND VAC;  Surgeon: [name], MD;  Location: [facility] OR;  Service: Orthopedics;  Laterality: Right;  . BRAIN SURGERY  1980's   Previous brain surgery in [place], [place]  . CESAREAN SECTION    . COLONOSCOPY N/A 09/17/2015   Procedure: COLONOSCOPY;  Surgeon: Charlott Rakes, MD;  Location: [facility] ENDOSCOPY;  Service: Endoscopy;  Laterality: N/A;  . ESOPHAGOGASTRODUODENOSCOPY N/A 09/17/2015   Procedure: ESOPHAGOGASTRODUODENOSCOPY (EGD);  Surgeon: Charlott Rakes, MD;  Location: [facility] ENDOSCOPY;  Service: Endoscopy;  Laterality: N/A;  .  I&D EXTREMITY  11/24/2010   Procedure: IRRIGATION AND DEBRIDEMENT EXTREMITY;  Surgeon: [name], MD;  Location: [facility] OR;  Service: Orthopedics;  Laterality: Left;  Debriedment of left plantar ulcer and trimming of toenails  . I&D EXTREMITY  07/02/2011   Procedure: IRRIGATION AND DEBRIDEMENT EXTREMITY;  Surgeon: [name], MD;  Location: [facility] OR;  Service: Orthopedics;  Laterality: Left;  I & D of Left Foot  . I&D EXTREMITY  07/06/2011   Procedure: IRRIGATION AND DEBRIDEMENT EXTREMITY;  Surgeon: [name], MD;  Location: [facility] OR;  Service: Orthopedics;  Laterality: Right;   IRRIGATION/DEBRIDEMENT RIGHT BELOW KNEE AMPUTATION   Past Medical History:  Diagnosis Date  . Anemia   . Arthritis   . Diabetes mellitus   . Diabetic foot ulcer associated with type 2 diabetes mellitus (HCC)   . Hypertension   . Intracerebral hemorrhage (HCC) As a teenager    States she had burst blood vessel as teenager, now with resultant minor visual field and hearing deficits  . Neuropathy (HCC)   . Stroke (HCC)    BP 124/73 (BP Location: Left Arm, Patient Position: Sitting, Cuff Size: Large)   Pulse 97   Resp 14   LMP  (LMP Unknown)   SpO2 98%   Opioid Risk Score:   Fall Risk Score:  `1  Depression screen PHQ 2/9  Depression screen PHQ 2/9 10/01/2015  Decreased Interest 0  Down, Depressed, Hopeless 0  PHQ - 2 Score 0  Tired, decreased energy 1  Change in appetite 0  Feeling bad or failure about yourself  2  Trouble concentrating 1  Moving slowly or fidgety/restless 1  Suicidal thoughts 0    Review of Systems  Constitutional: Negative.   HENT: Negative.   Eyes: Negative.   Respiratory: Negative.   Cardiovascular: Negative.   Gastrointestinal: Positive for diarrhea and nausea.  Endocrine: Negative.   Genitourinary: Positive for difficulty urinating.  Musculoskeletal: Positive for gait problem.  Allergic/Immunologic: Negative.   Neurological:       Tingling   All other systems reviewed and are negative.      Objective:   Physical Exam Constitutional: She appears well-developed and well-nourished.   HENT: Normocephalic and atraumatic.  Eyes: Conjunctivae and EOM are normal.  Cardiovascular: Normal rate and regular rhythm.  No murmur heard. Respiratory: Effort normal and breath sounds normal.  GI: Soft. Bowel sounds are normal. She exhibits no distension. There is no tenderness.  Musculoskeletal: She exhibits no edema. TTP left hip.  R-BKA well healed. Neurological: She is alert and oriented.  Speech clear and follows commands without difficulty.  Sensory deficits LLE, right residual limb and bilateral hands.  Motor: B/l UE: 5/5 proximal to distal LLE: 5/5 hip flexion, knee extension, 5/5 ankle dorsi/plantar flexion RLE: 5/5 hip flexion, knee extension   Skin: Skin is warm and dry. Numerous skin lesions. Sacral ulcer Psychiatric: She has flat mood (slightly improved). Poor recall.    Assessment & Plan:  50 year old female with history of intracerebral hemorrhage s/p left craniotomy as teenager, diabetes mellitus type 2, hypertension, right BKA due to diabetic foot ulcer 07/2011, several hospital admissions presents for hospital follow up after receiving CIR for multiple medical issues. Pt is very poor historian.   1.  Deconditioning secondary to multiple medical issues including severe anemia, acute renal failure in pt with hx of right BKA  Therapies have not started yet  No need for DME  Follow up for prosthesis  2. Pain Management  Cont Gabapenin 100  TID  Norco prn for chronic back pain  3. Skin/Wound Care  Cont dressing changes daily  4. T2DM with neuropathy, nephropathy, vasculopathy and ?gastroparesis:   Cont follow up with PCP and Endo           Monitor CBGs  5. CKD:   Follow per PCP  6. Bilateral hydronephrosis due to chronic urinary retention/neurogenic bladder:    Cont foley  Pt to follow up with PCP  Meds d/ced  Pt will need repeat U/S  7. ABLA on Anemia of chronic disease:   Seen by Heme/Onc, follow recs  8. Thrombocytopenia:   See above  9. HTN   Cont recs per PCP  Meds reviewed Referrals reviewed All questions answered

## 2015-10-14 ENCOUNTER — Ambulatory Visit: Payer: Medicaid Other | Admitting: Family Medicine

## 2015-10-15 ENCOUNTER — Telehealth: Payer: Self-pay | Admitting: Oncology

## 2015-10-15 NOTE — Telephone Encounter (Signed)
10/18 appointments canceled per patient request.

## 2015-10-22 ENCOUNTER — Other Ambulatory Visit: Payer: Self-pay

## 2015-10-22 ENCOUNTER — Ambulatory Visit: Payer: Self-pay | Admitting: Oncology

## 2015-10-22 DIAGNOSIS — I129 Hypertensive chronic kidney disease with stage 1 through stage 4 chronic kidney disease, or unspecified chronic kidney disease: Secondary | ICD-10-CM

## 2015-10-22 DIAGNOSIS — N183 Chronic kidney disease, stage 3 (moderate): Secondary | ICD-10-CM | POA: Diagnosis not present

## 2015-10-22 DIAGNOSIS — N179 Acute kidney failure, unspecified: Secondary | ICD-10-CM | POA: Diagnosis not present

## 2015-10-22 DIAGNOSIS — E1122 Type 2 diabetes mellitus with diabetic chronic kidney disease: Secondary | ICD-10-CM | POA: Diagnosis not present

## 2015-11-03 ENCOUNTER — Other Ambulatory Visit: Payer: Self-pay | Admitting: Nephrology

## 2015-11-03 ENCOUNTER — Other Ambulatory Visit: Payer: Self-pay | Admitting: Specialist

## 2015-11-03 DIAGNOSIS — E041 Nontoxic single thyroid nodule: Secondary | ICD-10-CM

## 2015-11-05 ENCOUNTER — Encounter: Payer: Self-pay | Admitting: Physical Medicine & Rehabilitation

## 2015-11-05 ENCOUNTER — Encounter: Payer: Medicaid Other | Attending: Physical Medicine & Rehabilitation | Admitting: Physical Medicine & Rehabilitation

## 2015-11-05 ENCOUNTER — Encounter (INDEPENDENT_AMBULATORY_CARE_PROVIDER_SITE_OTHER): Payer: Self-pay

## 2015-11-05 VITALS — BP 128/77 | HR 94 | Resp 14

## 2015-11-05 DIAGNOSIS — M1990 Unspecified osteoarthritis, unspecified site: Secondary | ICD-10-CM | POA: Diagnosis not present

## 2015-11-05 DIAGNOSIS — I129 Hypertensive chronic kidney disease with stage 1 through stage 4 chronic kidney disease, or unspecified chronic kidney disease: Secondary | ICD-10-CM | POA: Diagnosis present

## 2015-11-05 DIAGNOSIS — D62 Acute posthemorrhagic anemia: Secondary | ICD-10-CM | POA: Diagnosis not present

## 2015-11-05 DIAGNOSIS — Z9119 Patient's noncompliance with other medical treatment and regimen: Secondary | ICD-10-CM | POA: Diagnosis not present

## 2015-11-05 DIAGNOSIS — N1330 Unspecified hydronephrosis: Secondary | ICD-10-CM | POA: Insufficient documentation

## 2015-11-05 DIAGNOSIS — E1143 Type 2 diabetes mellitus with diabetic autonomic (poly)neuropathy: Secondary | ICD-10-CM | POA: Diagnosis not present

## 2015-11-05 DIAGNOSIS — I10 Essential (primary) hypertension: Secondary | ICD-10-CM

## 2015-11-05 DIAGNOSIS — N319 Neuromuscular dysfunction of bladder, unspecified: Secondary | ICD-10-CM

## 2015-11-05 DIAGNOSIS — E1142 Type 2 diabetes mellitus with diabetic polyneuropathy: Secondary | ICD-10-CM

## 2015-11-05 DIAGNOSIS — G5711 Meralgia paresthetica, right lower limb: Secondary | ICD-10-CM

## 2015-11-05 DIAGNOSIS — E1165 Type 2 diabetes mellitus with hyperglycemia: Secondary | ICD-10-CM

## 2015-11-05 DIAGNOSIS — D696 Thrombocytopenia, unspecified: Secondary | ICD-10-CM | POA: Insufficient documentation

## 2015-11-05 DIAGNOSIS — E11621 Type 2 diabetes mellitus with foot ulcer: Secondary | ICD-10-CM | POA: Insufficient documentation

## 2015-11-05 DIAGNOSIS — Z8673 Personal history of transient ischemic attack (TIA), and cerebral infarction without residual deficits: Secondary | ICD-10-CM | POA: Insufficient documentation

## 2015-11-05 DIAGNOSIS — G629 Polyneuropathy, unspecified: Secondary | ICD-10-CM

## 2015-11-05 DIAGNOSIS — Z89511 Acquired absence of right leg below knee: Secondary | ICD-10-CM | POA: Insufficient documentation

## 2015-11-05 DIAGNOSIS — N189 Chronic kidney disease, unspecified: Secondary | ICD-10-CM | POA: Diagnosis present

## 2015-11-05 DIAGNOSIS — Z91199 Patient's noncompliance with other medical treatment and regimen due to unspecified reason: Secondary | ICD-10-CM

## 2015-11-05 DIAGNOSIS — E1121 Type 2 diabetes mellitus with diabetic nephropathy: Secondary | ICD-10-CM

## 2015-11-05 DIAGNOSIS — G8929 Other chronic pain: Secondary | ICD-10-CM | POA: Insufficient documentation

## 2015-11-05 DIAGNOSIS — Z9889 Other specified postprocedural states: Secondary | ICD-10-CM | POA: Diagnosis not present

## 2015-11-05 DIAGNOSIS — R339 Retention of urine, unspecified: Secondary | ICD-10-CM

## 2015-11-05 DIAGNOSIS — N179 Acute kidney failure, unspecified: Secondary | ICD-10-CM | POA: Insufficient documentation

## 2015-11-05 DIAGNOSIS — E1122 Type 2 diabetes mellitus with diabetic chronic kidney disease: Secondary | ICD-10-CM | POA: Insufficient documentation

## 2015-11-05 DIAGNOSIS — IMO0002 Reserved for concepts with insufficient information to code with codable children: Secondary | ICD-10-CM

## 2015-11-05 DIAGNOSIS — I999 Unspecified disorder of circulatory system: Secondary | ICD-10-CM

## 2015-11-05 DIAGNOSIS — R269 Unspecified abnormalities of gait and mobility: Secondary | ICD-10-CM

## 2015-11-05 MED ORDER — GABAPENTIN 100 MG PO CAPS: 200.0000 mg | ORAL_CAPSULE | Freq: Every day | ORAL | 1 refills | Status: DC

## 2015-11-05 MED ORDER — AMITRIPTYLINE HCL 10 MG PO TABS
10.0000 mg | ORAL_TABLET | Freq: Every day | ORAL | 1 refills | Status: DC
Start: 2015-11-05 — End: 2015-11-28

## 2015-11-05 NOTE — Progress Notes (Signed)
Subjective:    Patient ID: [name], female    DOB: 09-11-1965, 50 y.o.   MRN: [id number]  HPI  50 year old female with history of intracerebral hemorrhage s/p left craniotomy as teenager, diabetes mellitus type 2, hypertension, right BKA due to diabetic foot ulcer 07/2011, several hospital admissions presents for hospital follow up.   Last clinic visit 10/01/15.  Pt is very poor historian.  She states she still has not called the prosthetist.  Her ulcer is improving.  Her CBGs have been ~130.  Pt continues to use indwelling foley and has an appointment end of this month with Urology.  She was supposed to go after discharge, but has not gone yet.  She has not seen her PCP.  She is currently taking Gabapentin 100 TID, but she states this is not helping.  She states icy/hot seems to helps.  She has seen Nephrologist.  Overall, she states she is in pain.   Pain Inventory Average Pain 10 Pain Right Now 10 My pain is sharp  In the last 24 hours, has pain interfered with the following? General activity 0 Relation with others 0 Enjoyment of life 0 What TIME of day is your pain at its worst? morning, night  Sleep (in general) Poor  Pain is worse with: sitting, inactivity and some activites Pain improves with: nothing Relief from Meds: 0  Mobility use a wheelchair transfers alone Do you have any goals in this area?  yes  Function disabled: date disabled . I need assistance with the following:  shopping  Neuro/Psych bladder control problems tingling trouble walking  Prior Studies Any changes since last visit?  no  Physicians involved in your care Any changes since last visit?  no   Family History  Problem Relation Age of Onset  . Diabetes Mother   . Hypertension Mother   . Hyperlipidemia Mother   . Diabetes Father   . Cancer Father     prostate  . Hyperlipidemia Father   . Hypertension Father   . Diabetes Brother   . Peripheral Artery Disease Brother     has  had toes amputated   Social History   Social History  . Marital status: Married    Spouse name: N/A  . Number of children: N/A  . Years of education: N/A   Social History Main Topics  . Smoking status: Never Smoker  . Smokeless tobacco: Never Used  . Alcohol use No  . Drug use: No  . Sexual activity: Not Asked   Other Topics Concern  . None   Social History Narrative   Has a son and daughter    Past Surgical History:  Procedure Laterality Date  . AMPUTATION  11/24/2010   Procedure: AMPUTATION FOOT;  Surgeon: [name], MD;  Location: [facility] OR;  Service: Orthopedics;  Laterality: Right;  Right  FOOT TRANS METATARSAL AMPUTATION  . AMPUTATION  07/02/2011   Procedure: AMPUTATION BELOW KNEE;  Surgeon: [name], MD;  Location: [facility] OR;  Service: Orthopedics;  Laterality: Right;  . APPLICATION OF WOUND VAC  07/02/2011   Procedure: APPLICATION OF WOUND VAC;  Surgeon: [name], MD;  Location: [facility] OR;  Service: Orthopedics;  Laterality: Right;  . BRAIN SURGERY  1980's   Previous brain surgery in [place], [place]  . CESAREAN SECTION    . COLONOSCOPY N/A 09/17/2015   Procedure: COLONOSCOPY;  Surgeon: Charlott Rakes, MD;  Location: [facility] ENDOSCOPY;  Service: Endoscopy;  Laterality: N/A;  . ESOPHAGOGASTRODUODENOSCOPY N/A 09/17/2015  Procedure: ESOPHAGOGASTRODUODENOSCOPY (EGD);  Surgeon: Charlott RakesVincent Schooler, MD;  Location: [facility] ENDOSCOPY;  Service: Endoscopy;  Laterality: N/A;  . I&D EXTREMITY  11/24/2010   Procedure: IRRIGATION AND DEBRIDEMENT EXTREMITY;  Surgeon: Toni ArthursJohn Hewitt, MD;  Location: [facility] OR;  Service: Orthopedics;  Laterality: Left;  Debriedment of left plantar ulcer and trimming of toenails  . I&D EXTREMITY  07/02/2011   Procedure: IRRIGATION AND DEBRIDEMENT EXTREMITY;  Surgeon: Toni ArthursJohn Hewitt, MD;  Location: [facility] OR;  Service: Orthopedics;  Laterality: Left;  I & D of Left Foot  . I&D EXTREMITY  07/06/2011   Procedure: IRRIGATION AND DEBRIDEMENT EXTREMITY;  Surgeon: Toni ArthursJohn Hewitt, MD;  Location: [facility]  OR;  Service: Orthopedics;  Laterality: Right;  IRRIGATION/DEBRIDEMENT RIGHT BELOW KNEE AMPUTATION   Past Medical History:  Diagnosis Date  . Anemia   . Arthritis   . Diabetes mellitus   . Diabetic foot ulcer associated with type 2 diabetes mellitus (HCC)   . Hypertension   . Intracerebral hemorrhage (HCC) As a teenager    States she had burst blood vessel as teenager, now with resultant minor visual field and hearing deficits  . Neuropathy (HCC)   . Stroke (HCC)    BP 128/77 (BP Location: Right Arm, Patient Position: Sitting, Cuff Size: Large)   Pulse 94   Resp 14   SpO2 98%   Opioid Risk Score:   Fall Risk Score:  `1  Depression screen PHQ 2/9  Depression screen PHQ 2/9 10/01/2015  Decreased Interest 0  Down, Depressed, Hopeless 0  PHQ - 2 Score 0  Tired, decreased energy 1  Change in appetite 0  Feeling bad or failure about yourself  2  Trouble concentrating 1  Moving slowly or fidgety/restless 1  Suicidal thoughts 0    Review of Systems  Constitutional: Negative.   HENT: Negative.   Eyes: Negative.   Respiratory: Negative.   Cardiovascular: Negative.   Endocrine: Negative.   Genitourinary: Positive for difficulty urinating.  Musculoskeletal: Positive for arthralgias and gait problem.  Skin: Negative.   Allergic/Immunologic: Negative.   Neurological: Positive for numbness.       Tingling   Hematological: Negative.   Psychiatric/Behavioral: Negative.   All other systems reviewed and are negative.      Objective:   Physical Exam Constitutional: She appears well-developed and well-nourished.  NAD. HENT: Normocephalic and atraumatic.  Eyes: Conjunctivae and EOM are normal.  Cardiovascular: RRR. No JVD. Respiratory: Effort normal and breath sounds normal.  GI: Soft. Bowel sounds are normal. She exhibits no distension. There is no tenderness.  Musculoskeletal: She exhibits no edema. TTP left hip.  R-BKA well healed. Neurological: She is alert and oriented.   Speech clear and follows commands without difficulty. Sensory deficits LLE, right residual limb and bilateral hands.  Motor: B/l UE: 5/5 proximal to distal LLE: 5/5 hip flexion, knee extension, 5/5 ankle dorsi/plantar flexion RLE: 5/5 hip flexion, knee extension   Skin: Numerous skin lesions. Sacral ulcer - not examined today Psychiatric: She has flat mood. Poor recall.    Assessment & Plan:  50 year old female with history of intracerebral hemorrhage s/p left craniotomy as teenager, diabetes mellitus type 2, hypertension, right BKA due to diabetic foot ulcer 07/2011, several hospital admissions presents for hospital follow up. Pt is very poor historian. History of medical regimen noncompliance.   1.  Deconditioning secondary to multiple medical issues including severe anemia, acute renal failure in pt with hx of right BKA  Therapies have not started yet  No need for DME  Follow up for prosthesis, pt still has not done, states that she just hasn't gotten to it.   2. Pain Management/Neuropathic pain  Gabapenin changed to 200 daily  Will order Elavil 10  3. Skin/Wound Care  Cont dressing changes daily  4. T2DM with neuropathy, nephropathy, vasculopathy and ?gastroparesis:   Cont follow up with PCP and Endo (pt has not seen either yet)           Monitor CBGs  5. CKD:   Follow Nephro recs  6. Bilateral hydronephrosis due to chronic urinary retention/neurogenic bladder:    Cont foley for time being  Pt to follow up with PCP (has not done)  Pt to follow up with Urology, but has changed/missed several appointments, states she has appointment end of this mont  Pt will need repeat U/S  7. ABLA on Anemia of chronic disease:   Seen by Heme/Onc, follow recs  8. Thrombocytopenia:   See above  9. HTN   Cont recs per PCP (no changes at presents, encouraged pt to follow up)  10. ?Meralgia paresthetica  Difficult to assess due to comorbidiites, however, chronically in wheelchair and now  has symptoms more pronounced in this distribution  Cont meds  Educated on pressure relief  11. Abnormality of gait  Cont gait for safety

## 2015-11-25 ENCOUNTER — Other Ambulatory Visit: Payer: Self-pay

## 2015-11-25 ENCOUNTER — Encounter (HOSPITAL_COMMUNITY): Payer: Self-pay | Admitting: Emergency Medicine

## 2015-11-25 ENCOUNTER — Inpatient Hospital Stay (HOSPITAL_COMMUNITY)
Admission: EM | Admit: 2015-11-25 | Discharge: 2015-11-28 | DRG: 690 | Disposition: A | Discharge status: Home / Self Care | Payer: Medicaid Other | Attending: Family Medicine | Admitting: Family Medicine

## 2015-11-25 ENCOUNTER — Other Ambulatory Visit (HOSPITAL_COMMUNITY): Payer: Self-pay

## 2015-11-25 DIAGNOSIS — Z9889 Other specified postprocedural states: Secondary | ICD-10-CM

## 2015-11-25 DIAGNOSIS — Z91199 Patient's noncompliance with other medical treatment and regimen due to unspecified reason: Secondary | ICD-10-CM

## 2015-11-25 DIAGNOSIS — Z89511 Acquired absence of right leg below knee: Secondary | ICD-10-CM

## 2015-11-25 DIAGNOSIS — E876 Hypokalemia: Secondary | ICD-10-CM | POA: Diagnosis present

## 2015-11-25 DIAGNOSIS — E1122 Type 2 diabetes mellitus with diabetic chronic kidney disease: Secondary | ICD-10-CM | POA: Diagnosis present

## 2015-11-25 DIAGNOSIS — E1142 Type 2 diabetes mellitus with diabetic polyneuropathy: Secondary | ICD-10-CM | POA: Diagnosis present

## 2015-11-25 DIAGNOSIS — I129 Hypertensive chronic kidney disease with stage 1 through stage 4 chronic kidney disease, or unspecified chronic kidney disease: Secondary | ICD-10-CM | POA: Diagnosis present

## 2015-11-25 DIAGNOSIS — Z87440 Personal history of urinary (tract) infections: Secondary | ICD-10-CM

## 2015-11-25 DIAGNOSIS — Z8673 Personal history of transient ischemic attack (TIA), and cerebral infarction without residual deficits: Secondary | ICD-10-CM

## 2015-11-25 DIAGNOSIS — Z833 Family history of diabetes mellitus: Secondary | ICD-10-CM

## 2015-11-25 DIAGNOSIS — N390 Urinary tract infection, site not specified: Principal | ICD-10-CM | POA: Diagnosis present

## 2015-11-25 DIAGNOSIS — Z79899 Other long term (current) drug therapy: Secondary | ICD-10-CM

## 2015-11-25 DIAGNOSIS — E1165 Type 2 diabetes mellitus with hyperglycemia: Secondary | ICD-10-CM | POA: Diagnosis present

## 2015-11-25 DIAGNOSIS — R42 Dizziness and giddiness: Secondary | ICD-10-CM

## 2015-11-25 DIAGNOSIS — A498 Other bacterial infections of unspecified site: Secondary | ICD-10-CM

## 2015-11-25 DIAGNOSIS — I10 Essential (primary) hypertension: Secondary | ICD-10-CM | POA: Diagnosis present

## 2015-11-25 DIAGNOSIS — Z809 Family history of malignant neoplasm, unspecified: Secondary | ICD-10-CM

## 2015-11-25 DIAGNOSIS — N183 Chronic kidney disease, stage 3 (moderate): Secondary | ICD-10-CM | POA: Diagnosis present

## 2015-11-25 DIAGNOSIS — B3749 Other urogenital candidiasis: Secondary | ICD-10-CM

## 2015-11-25 DIAGNOSIS — Z9119 Patient's noncompliance with other medical treatment and regimen: Secondary | ICD-10-CM

## 2015-11-25 DIAGNOSIS — B952 Enterococcus as the cause of diseases classified elsewhere: Secondary | ICD-10-CM | POA: Diagnosis present

## 2015-11-25 DIAGNOSIS — N179 Acute kidney failure, unspecified: Secondary | ICD-10-CM | POA: Diagnosis present

## 2015-11-25 DIAGNOSIS — N189 Chronic kidney disease, unspecified: Secondary | ICD-10-CM

## 2015-11-25 DIAGNOSIS — T83511A Infection and inflammatory reaction due to indwelling urethral catheter, initial encounter: Secondary | ICD-10-CM | POA: Diagnosis present

## 2015-11-25 DIAGNOSIS — E860 Dehydration: Secondary | ICD-10-CM | POA: Diagnosis present

## 2015-11-25 DIAGNOSIS — D638 Anemia in other chronic diseases classified elsewhere: Secondary | ICD-10-CM | POA: Diagnosis present

## 2015-11-25 DIAGNOSIS — R55 Syncope and collapse: Secondary | ICD-10-CM | POA: Diagnosis present

## 2015-11-25 DIAGNOSIS — I951 Orthostatic hypotension: Secondary | ICD-10-CM | POA: Diagnosis present

## 2015-11-25 DIAGNOSIS — R000 Tachycardia, unspecified: Secondary | ICD-10-CM | POA: Diagnosis present

## 2015-11-25 DIAGNOSIS — M1990 Unspecified osteoarthritis, unspecified site: Secondary | ICD-10-CM | POA: Diagnosis present

## 2015-11-25 DIAGNOSIS — Z993 Dependence on wheelchair: Secondary | ICD-10-CM

## 2015-11-25 DIAGNOSIS — Z8249 Family history of ischemic heart disease and other diseases of the circulatory system: Secondary | ICD-10-CM

## 2015-11-25 HISTORY — DX: Type 2 diabetes mellitus without complications: E11.9

## 2015-11-25 HISTORY — DX: Dorsalgia, unspecified: M54.9

## 2015-11-25 LAB — CBC WITH DIFFERENTIAL/PLATELET
Basophils Absolute: 0 10*3/uL (ref 0.0–0.1)
Basophils Relative: 0 %
Eosinophils Absolute: 0 10*3/uL (ref 0.0–0.7)
Eosinophils Relative: 0 %
HCT: 41.2 % (ref 36.0–46.0)
Hemoglobin: 13.7 g/dL (ref 12.0–15.0)
Lymphocytes Relative: 27 %
Lymphs Abs: 2.4 10*3/uL (ref 0.7–4.0)
MCH: 27.6 pg (ref 26.0–34.0)
MCHC: 33.3 g/dL (ref 30.0–36.0)
MCV: 82.9 fL (ref 78.0–100.0)
Monocytes Absolute: 0.6 10*3/uL (ref 0.1–1.0)
Monocytes Relative: 7 %
Neutro Abs: 5.8 10*3/uL (ref 1.7–7.7)
Neutrophils Relative %: 66 %
Platelets: 130 10*3/uL — ABNORMAL LOW (ref 150–400)
RBC: 4.97 MIL/uL (ref 3.87–5.11)
RDW: 14.8 % (ref 11.5–15.5)
WBC: 9 10*3/uL (ref 4.0–10.5)

## 2015-11-25 LAB — COMPREHENSIVE METABOLIC PANEL
ALT: 24 U/L (ref 14–54)
AST: 26 U/L (ref 15–41)
Albumin: 4.2 g/dL (ref 3.5–5.0)
Alkaline Phosphatase: 97 U/L (ref 38–126)
Anion gap: 14 (ref 5–15)
BUN: 53 mg/dL — ABNORMAL HIGH (ref 6–20)
CO2: 23 mmol/L (ref 22–32)
Calcium: 9.5 mg/dL (ref 8.9–10.3)
Chloride: 93 mmol/L — ABNORMAL LOW (ref 101–111)
Creatinine, Ser: 2.89 mg/dL — ABNORMAL HIGH (ref 0.44–1.00)
GFR calc Af Amer: 21 mL/min — ABNORMAL LOW (ref >60)
GFR calc non Af Amer: 18 mL/min — ABNORMAL LOW (ref >60)
Glucose, Bld: 236 mg/dL — ABNORMAL HIGH (ref 65–99)
Potassium: 3.6 mmol/L (ref 3.5–5.1)
Sodium: 130 mmol/L — ABNORMAL LOW (ref 135–145)
Total Bilirubin: 1 mg/dL (ref 0.3–1.2)
Total Protein: 7.9 g/dL (ref 6.5–8.1)

## 2015-11-25 LAB — URINALYSIS, ROUTINE W REFLEX MICROSCOPIC
Glucose, UA: NEGATIVE mg/dL
Ketones, ur: NEGATIVE mg/dL
Nitrite: NEGATIVE
Protein, ur: 100 mg/dL — AB
Specific Gravity, Urine: 1.019 (ref 1.005–1.030)
pH: 5.5 (ref 5.0–8.0)

## 2015-11-25 LAB — I-STAT CG4 LACTIC ACID, ED: Lactic Acid, Venous: 1.77 mmol/L (ref 0.5–1.9)

## 2015-11-25 LAB — GLUCOSE, CAPILLARY
Glucose-Capillary: 202 mg/dL — ABNORMAL HIGH (ref 65–99)
Glucose-Capillary: 175 mg/dL — ABNORMAL HIGH (ref 65–99)

## 2015-11-25 LAB — URINE MICROSCOPIC-ADD ON

## 2015-11-25 LAB — LIPASE, BLOOD: Lipase: 25 U/L (ref 11–51)

## 2015-11-25 LAB — I-STAT TROPONIN, ED: Troponin i, poc: 0.03 ng/mL (ref 0.00–0.08)

## 2015-11-25 MED ORDER — ONDANSETRON HCL 4 MG/2ML IJ SOLN: 4.0000 mg | Freq: Four times a day (QID) | INTRAMUSCULAR | Status: DC | PRN

## 2015-11-25 MED ORDER — ZOLPIDEM TARTRATE 5 MG PO TABS: 5.0000 mg | ORAL_TABLET | Freq: Every evening | ORAL | Status: DC | PRN

## 2015-11-25 MED ORDER — DEXTROSE 5 % IV SOLN
1.0000 g | Freq: Once | INTRAVENOUS | Status: AC
  Administered 2015-11-25: 1 g via INTRAVENOUS
  Filled 2015-11-25: qty 10

## 2015-11-25 MED ORDER — FAMOTIDINE 20 MG PO TABS
20.0000 mg | ORAL_TABLET | Freq: Two times a day (BID) | ORAL | Status: DC
  Administered 2015-11-25 – 2015-11-28 (×6): 20 mg via ORAL
  Filled 2015-11-25 (×6): qty 1

## 2015-11-25 MED ORDER — ACETAMINOPHEN 500 MG PO TABS
1000.0000 mg | ORAL_TABLET | Freq: Four times a day (QID) | ORAL | Status: DC | PRN
  Administered 2015-11-26 – 2015-11-27 (×2): 1000 mg via ORAL
  Filled 2015-11-25 (×2): qty 2

## 2015-11-25 MED ORDER — SODIUM CHLORIDE 0.9 % IV SOLN
Freq: Once | INTRAVENOUS | Status: AC
  Administered 2015-11-25: 15:00:00 via INTRAVENOUS

## 2015-11-25 MED ORDER — SENNOSIDES-DOCUSATE SODIUM 8.6-50 MG PO TABS
1.0000 | ORAL_TABLET | Freq: Every evening | ORAL | Status: DC | PRN
  Filled 2015-11-25: qty 1

## 2015-11-25 MED ORDER — SODIUM CHLORIDE 0.9% FLUSH
3.0000 mL | Freq: Two times a day (BID) | INTRAVENOUS | Status: DC
  Administered 2015-11-26 – 2015-11-28 (×5): 3 mL via INTRAVENOUS

## 2015-11-25 MED ORDER — HEPARIN SODIUM (PORCINE) 5000 UNIT/ML IJ SOLN
5000.0000 [IU] | Freq: Three times a day (TID) | INTRAMUSCULAR | Status: DC
  Administered 2015-11-25 – 2015-11-28 (×9): 5000 [IU] via SUBCUTANEOUS
  Filled 2015-11-25 (×8): qty 1

## 2015-11-25 MED ORDER — CALCIUM CARBONATE ANTACID 500 MG PO CHEW
2.0000 | CHEWABLE_TABLET | Freq: Three times a day (TID) | ORAL | Status: DC
  Administered 2015-11-25 – 2015-11-28 (×8): 400 mg via ORAL
  Filled 2015-11-25 (×8): qty 2

## 2015-11-25 MED ORDER — HYDROCODONE-ACETAMINOPHEN 5-325 MG PO TABS
1.0000 | ORAL_TABLET | ORAL | Status: DC | PRN
  Administered 2015-11-27 – 2015-11-28 (×3): 2 via ORAL
  Filled 2015-11-25 (×3): qty 2

## 2015-11-25 MED ORDER — HYDRALAZINE HCL 20 MG/ML IJ SOLN
10.0000 mg | Freq: Three times a day (TID) | INTRAMUSCULAR | Status: DC | PRN
  Administered 2015-11-25: 10 mg via INTRAVENOUS
  Filled 2015-11-25: qty 1

## 2015-11-25 MED ORDER — ONDANSETRON HCL 4 MG PO TABS: 4.0000 mg | ORAL_TABLET | Freq: Four times a day (QID) | ORAL | Status: DC | PRN

## 2015-11-25 MED ORDER — FERROUS SULFATE 325 (65 FE) MG PO TABS: 325.0000 mg | ORAL_TABLET | Freq: Two times a day (BID) | ORAL | Status: DC

## 2015-11-25 MED ORDER — ACETAMINOPHEN 650 MG RE SUPP: 650.0000 mg | Freq: Four times a day (QID) | RECTAL | Status: DC | PRN

## 2015-11-25 MED ORDER — DEXTROSE 5 % IV SOLN
1.0000 g | INTRAVENOUS | Status: DC
  Administered 2015-11-26 – 2015-11-27 (×2): 1 g via INTRAVENOUS
  Filled 2015-11-25 (×2): qty 10

## 2015-11-25 MED ORDER — INSULIN ASPART 100 UNIT/ML ~~LOC~~ SOLN
0.0000 [IU] | Freq: Three times a day (TID) | SUBCUTANEOUS | Status: DC
  Administered 2015-11-25: 5 [IU] via SUBCUTANEOUS
  Administered 2015-11-26: 2 [IU] via SUBCUTANEOUS
  Administered 2015-11-26 (×2): 3 [IU] via SUBCUTANEOUS
  Administered 2015-11-27: 2 [IU] via SUBCUTANEOUS
  Administered 2015-11-27: 5 [IU] via SUBCUTANEOUS
  Administered 2015-11-28: 2 [IU] via SUBCUTANEOUS
  Administered 2015-11-28: 3 [IU] via SUBCUTANEOUS

## 2015-11-25 MED ORDER — ACETAMINOPHEN 325 MG PO TABS: 650.0000 mg | ORAL_TABLET | Freq: Four times a day (QID) | ORAL | Status: DC | PRN

## 2015-11-25 MED ORDER — SODIUM CHLORIDE 0.9 % IV SOLN: INTRAVENOUS | Status: AC

## 2015-11-25 MED ORDER — SODIUM CHLORIDE 0.9 % IV BOLUS (SEPSIS)
1000.0000 mL | Freq: Once | INTRAVENOUS | Status: AC
  Administered 2015-11-25: 1000 mL via INTRAVENOUS

## 2015-11-25 MED ORDER — INSULIN ASPART 100 UNIT/ML ~~LOC~~ SOLN
0.0000 [IU] | Freq: Every day | SUBCUTANEOUS | Status: DC
  Administered 2015-11-27: 3 [IU] via SUBCUTANEOUS

## 2015-11-25 NOTE — ED Notes (Signed)
Attempted to call report, sec asks for number, this nurse states that she will come up to do bedside report, sec states, "We haven't determined who is getting that PT so we will call you back."

## 2015-11-25 NOTE — ED Provider Notes (Signed)
[facility]-EM[facility]ENCY DEPT Provider Note   CSN: [id number] Arrival date & time: 11/25/15  1305     History   Chief Complaint Chief Complaint  Patient presents with  . Fall    HPI [name] is a 50 y.o. female.  HPI [name] is a 50 y.o. female with history of diabetes, hypertension, prior intracerebral hemorrhage, CVA, arthritis, presents to emergency department complaining of a near syncopal episode. Patient states that she has been feeling lightheaded upon standing this morning. She states she uses wheelchair due to right BKA. She states that she does have to get out of the wheelchair to reach for things and to turn her lights off. She states today when she got up she became dizzy, and reports falling to the ground. She does not think she lost consciousness completely but states she was very dizzy. She was unable to get up. She does report several episodes of nausea and vomiting over the weekend. She denies any abdominal pain. No diarrhea. She states she has had decreased appetite because of this nausea and vomiting. She reports she has indwelling Foley catheter that was placed in September of this year, 2 months ago because she was unable to empty her bladder completely. She states she has had an appointment with urology but has had to miss a couple of them. She has a follow-up appointment in one week.  Past Medical History:  Diagnosis Date  . Anemia   . Arthritis   . Diabetes mellitus   . Diabetic foot ulcer associated with type 2 diabetes mellitus (HCC)   . Hypertension   . Intracerebral hemorrhage (HCC) As a teenager    States she had burst blood vessel as teenager, now with resultant minor visual field and hearing deficits  . Neuropathy (HCC)   . Stroke [facility](HCC)     Patient Active Problem List   Diagnosis Date Noted  . Prolonged Q-T interval on ECG 09/25/2015  . Acute worsening of stage 3 chronic kidney disease 09/24/2015  . Diabetes mellitus due to underlying  condition with chronic kidney disease, without long-term current use of insulin (HCC)   . Fever   . Status post below knee amputation of right lower extremity (HCC)   . Diabetic peripheral neuropathy (HCC)   . Neuropathic pain   . Benign essential HTN   . Folliculitis   . Slow transit constipation   . Anemia of chronic disease   . Bilateral hydronephrosis   . Urinary retention   . CKD (chronic kidney disease)   . Uncontrolled type 2 diabetes mellitus with diabetic nephropathy, without long-term current use of insulin (HCC)   . Vasculopathy   . Debility 09/04/2015  . AKI (acute kidney injury) (HCC)   . DM type 2 with diabetic peripheral neuropathy (HCC)   . S/P BKA (below knee amputation) unilateral (HCC)   . Medical non-compliance   . Benign skin lesion of multiple sites   . Tachypnea   . Acute blood loss anemia   . Neurogenic bladder   . Occult blood positive stool   . Rectovaginal fistula   . Controlled diabetes mellitus type 2 with complications (HCC)   . Acute on chronic renal failure (HCC)   . Hydronephrosis determined by ultrasound   . Papular rash   . Uncontrolled type 2 diabetes mellitus with complication (HCC)   . Paresthesia   . Thrombocytopenia (HCC) 08/23/2015  . Pressure ulcer 08/23/2015  . Severe anemia 08/23/2015  . UTI (lower urinary tract  infection) 08/23/2015  . Absolute anemia 08/23/2015  . Peripheral vascular disease, unspecified 04/27/2012  . Unilateral complete BKA (HCC) 07/09/2011  . Gas gangrene (HCC) 07/02/2011  . Sepsis(995.91) 07/02/2011  . Acute renal failure (HCC) 07/02/2011  . Diabetes mellitus (HCC) 11/22/2010  . Diabetic foot ulcer associated with type 2 diabetes mellitus (HCC)   . Hypertension   . Arthritis   . Neuropathy (HCC)   . Intracerebral hemorrhage [facility](HCC)     Past Surgical History:  Procedure Laterality Date  . AMPUTATION  11/24/2010   Procedure: AMPUTATION FOOT;  Surgeon: Toni ArthursJohn Hewitt, MD;  [facility];  Service:  Orthopedics;  Laterality: Right;  Right  FOOT TRANS METATARSAL AMPUTATION  . AMPUTATION  07/02/2011   Procedure: AMPUTATION BELOW KNEE;  Surgeon: Toni ArthursJohn Hewitt, MD;  Location: [facility] OR;  Service: Orthopedics;  Laterality: Right;  . APPLICATION OF WOUND VAC  07/02/2011   Procedure: APPLICATION OF WOUND VAC;  Surgeon: Toni ArthursJohn Hewitt, MD;  Location: [facility] OR;  Service: Orthopedics;  Laterality: Right;  . BRAIN SURGERY  1980's   [place]  . CESAREAN SECTION    . COLONOSCOPY N/A 09/17/2015   Procedure: COLONOSCOPY;  Surgeon: Charlott RakesVincent Schooler, MD;  [facility];  Service: Endoscopy;  Laterality: N/A;  . ESOPHAGOGASTRODUODENOSCOPY N/A 09/17/2015   Procedure: ESOPHAGOGASTRODUODENOSCOPY (EGD);  Surgeon: Charlott RakesVincent Schooler, MD;  [facility];  Service: Endoscopy;  Laterality: N/A;  . I&D EXTREMITY  11/24/2010   Procedure: IRRIGATION AND DEBRIDEMENT EXTREMITY;  Surgeon: Toni ArthursJohn Hewitt, MD;  Location: [facility] OR;  Service: Orthopedics;  Laterality: Left;  Debriedment of left plantar ulcer and trimming of toenails  . I&D EXTREMITY  07/02/2011   Procedure: IRRIGATION AND DEBRIDEMENT EXTREMITY;  Surgeon: Toni ArthursJohn Hewitt, MD;  Location: [facility] OR;  Service: Orthopedics;  Laterality: Left;  I & D of Left Foot  . I&D EXTREMITY  07/06/2011   Procedure: IRRIGATION AND DEBRIDEMENT EXTREMITY;  Surgeon: Toni ArthursJohn Hewitt, MD;  Location: [facility] OR;  Service: Orthopedics;  Laterality: Right;  IRRIGATION/DEBRIDEMENT RIGHT BELOW KNEE AMPUTATION    OB History    No data available       Home Medications    Prior to Admission medications   Medication Sig Start Date End Date Taking? Authorizing Provider  acetaminophen (TYLENOL) 500 MG tablet Take 1,000 mg by mouth every 6 (six) hours as needed for moderate pain.    Historical Provider, MD  amitriptyline (ELAVIL) 10 MG tablet Take 1 tablet (10 mg total) by mouth at bedtime. 11/05/15   Ankit Karis JubaAnil Patel, MD  B Complex-C (B-COMPLEX WITH VITAMIN C) tablet Take 1  tablet by mouth daily. 09/19/15   Jacquelynn CreePamela S Love, PA-C  calcium carbonate (TUMS - DOSED IN MG ELEMENTAL CALCIUM) 500 MG chewable tablet Chew 2 tablets (400 mg of elemental calcium total) by mouth 3 (three) times daily. 09/18/15   Jacquelynn CreePamela S Love, PA-C  famotidine (PEPCID) 20 MG tablet Take 1 tablet (20 mg total) by mouth 2 (two) times daily. 09/18/15   Jacquelynn CreePamela S Love, PA-C  ferrous sulfate 325 (65 FE) MG tablet Take 1 tablet (325 mg total) by mouth 2 (two) times daily with a meal. 09/18/15   Evlyn KannerPamela S Love, PA-C  gabapentin (NEURONTIN) 100 MG capsule Take 2 capsules (200 mg total) by mouth daily. 11/05/15   Ankit Karis JubaAnil Patel, MD  HYDROcodone-acetaminophen (NORCO/VICODIN) 5-325 MG tablet Take 0.5-1 tablets by mouth daily as needed for severe pain. Patient not taking: Reported on 11/05/2015 09/18/15   Evlyn KannerPamela S  Love, PA-C  Menthol, Topical Analgesic, (ICY HOT EX) Apply 1 application topically 2 (two) times daily as needed (pain).    Historical Provider, MD  Naphazoline HCl (CLEAR EYES OP) Place 1 drop into both eyes daily as needed (dry eyes/itching).    Historical Provider, MD  prochlorperazine (COMPAZINE) 5 MG tablet Take 1-2 tablets (5-10 mg total) by mouth every 8 (eight) hours as needed for nausea. 09/18/15   Jacquelynn Cree, PA-C    Family History Family History  Problem Relation Age of Onset  . Diabetes Mother   . Hypertension Mother   . Hyperlipidemia Mother   . Diabetes Father   . Cancer Father     prostate  . Hyperlipidemia Father   . Hypertension Father   . Diabetes Brother   . Peripheral Artery Disease Brother     has had toes amputated    Social History Social History  Substance Use Topics  . Smoking status: Never Smoker  . Smokeless tobacco: Never Used  . Alcohol use Yes     Comment: occasionally     Allergies   Patient has no known allergies.   Review of Systems Review of Systems  Constitutional: Negative for chills and fever.  Respiratory: Negative for cough, chest tightness  and shortness of breath.   Cardiovascular: Negative for chest pain, palpitations and leg swelling.  Gastrointestinal: Negative for abdominal pain, diarrhea, nausea and vomiting.  Genitourinary: Negative for dysuria, flank pain and pelvic pain.  Musculoskeletal: Negative for arthralgias, myalgias, neck pain and neck stiffness.  Skin: Negative for rash.  Neurological: Positive for dizziness and light-headedness. Negative for weakness and headaches.  All other systems reviewed and are negative.    Physical Exam Updated Vital Signs BP 109/79 (BP Location: Right Arm)   Pulse 104   Temp 98.2 F (36.8 C) (Oral)   Resp 14   Ht 5\' 7"  (1.702 m)   Wt 86.2 kg   SpO2 97%   BMI 29.76 kg/m   Physical Exam  Constitutional: She appears well-developed and well-nourished. No distress.  HENT:  Head: Normocephalic.  Eyes: Conjunctivae are normal.  Neck: Neck supple.  Cardiovascular: Normal rate, regular rhythm and normal heart sounds.   Pulmonary/Chest: Effort normal and breath sounds normal. No respiratory distress. She has no wheezes. She has no rales.  Abdominal: Soft. Bowel sounds are normal. She exhibits no distension. There is no tenderness. There is no rebound.  Musculoskeletal: She exhibits no edema.  Right BKA  Neurological: She is alert.  Skin: Skin is warm and dry.  Psychiatric: She has a normal mood and affect. Her behavior is normal.  Nursing note and vitals reviewed.    ED Treatments / Results  Labs (all labs ordered are listed, but only abnormal results are displayed) Labs Reviewed  CBC WITH DIFFERENTIAL/PLATELET - Abnormal; Notable for the following:       Result Value   Platelets 130 (*)    All other components within normal limits  COMPREHENSIVE METABOLIC PANEL - Abnormal; Notable for the following:    Sodium 130 (*)    Chloride 93 (*)    Glucose, Bld 236 (*)    BUN 53 (*)    Creatinine, Ser 2.89 (*)    GFR calc non Af Amer 18 (*)    GFR calc Af Amer 21 (*)     All other components within normal limits  URINALYSIS, ROUTINE W REFLEX MICROSCOPIC (NOT AT [facility]) - Abnormal; Notable for the following:    APPearance  TURBID (*)    Hgb urine dipstick LARGE (*)    Bilirubin Urine SMALL (*)    Protein, ur 100 (*)    Leukocytes, UA LARGE (*)    All other components within normal limits  URINE MICROSCOPIC-ADD ON - Abnormal; Notable for the following:    Squamous Epithelial / LPF 6-30 (*)    Bacteria, UA MANY (*)    Casts HYALINE CASTS (*)    All other components within normal limits  HEMOGLOBIN A1C - Abnormal; Notable for the following:    Hgb A1c MFr Bld 6.5 (*)    All other components within normal limits  BASIC METABOLIC PANEL - Abnormal; Notable for the following:    Potassium 3.1 (*)    Chloride 100 (*)    Glucose, Bld 147 (*)    BUN 41 (*)    Creatinine, Ser 1.93 (*)    GFR calc non Af Amer 29 (*)    GFR calc Af Amer 34 (*)    All other components within normal limits  CBC - Abnormal; Notable for the following:    Platelets 109 (*)    All other components within normal limits  GLUCOSE, CAPILLARY - Abnormal; Notable for the following:    Glucose-Capillary 202 (*)    All other components within normal limits  GLUCOSE, CAPILLARY - Abnormal; Notable for the following:    Glucose-Capillary 175 (*)    All other components within normal limits  GLUCOSE, CAPILLARY - Abnormal; Notable for the following:    Glucose-Capillary 157 (*)    All other components within normal limits  GLUCOSE, CAPILLARY - Abnormal; Notable for the following:    Glucose-Capillary 153 (*)    All other components within normal limits  GLUCOSE, CAPILLARY - Abnormal; Notable for the following:    Glucose-Capillary 140 (*)    All other components within normal limits  URINE CULTURE  LIPASE, BLOOD  I-STAT TROPOININ, ED  I-STAT CG4 LACTIC ACID, ED  I-STAT CG4 LACTIC ACID, ED    EKG  EKG Interpretation None       Radiology No results  found.  Procedures Procedures (including critical care time)  Medications Ordered in ED Medications  sodium chloride 0.9 % bolus 1,000 mL (not administered)     Initial Impression / Assessment and Plan / ED Course  I have reviewed the triage vital signs and the nursing notes.  Pertinent labs & imaging results that were available during my care of the patient were reviewed by me and considered in my medical decision making (see chart for details).  Clinical Course    Patient emergency department after episode of dizziness when standing up from her wheelchair today. Patient states she has been feeling dizzy this morning. She does report some nausea, diarrhea over the weekend. She is wheelchair bound due to right BKA. We will get orthostatics, labs, will give some IV fluids.  Patient is orthostatic, most likely from dehydration. Also with worsening renal function. Patient was admitted a few months ago for similar episode about time, renal insufficiency and hydronephrosis, had Foley placed. Patient's Foley here today is very contaminated with lots of debris and white coating to the tubing and the back. Foley was exchanged for a clean 1. Urine sample sent and shows UTI. Will admit for UTI treatment, hydration, orthostatic dizziness.     Final Clinical Impressions(s) / ED Diagnoses   Final diagnoses:  Near syncope  Urinary tract infection without hematuria, site unspecified  Orthostatic dizziness  Dehydration  New Prescriptions New Prescriptions   No medications on file     Jaynie Crumble, PA-C 11/26/15 2021    Melene Plan, DO 11/26/15 2328    Melene Plan, DO 11/26/15 2329

## 2015-11-25 NOTE — Progress Notes (Signed)
Ceftriaxone for UTI per pharmacy ordered.  Ceftriaxone does not need renal adjustment.  [employer] policy allows pharmacy to change the ordered dose based on indication without contacting the provider, therefore a consult is not required.   Plan: -ceftriaxone 1g IV q24h -pharmacy to sign off as no adjustment needed  Baldemar FridayMasters, Wilfred Siverson M 11/25/2015 4:40 PM

## 2015-11-25 NOTE — ED Triage Notes (Signed)
His. BKA, CVA  Wheelchair bound. Standing at stove, bot of weakness, fell. No LOC. EMS BP 80/60  HR 102. Foley bag tore away from catheter, catheter is still in. EMS reattached bag  On floor 40 minutes, Social worker found her on floor

## 2015-11-25 NOTE — H&P (Signed)
History and Physical    [name] ZOX:[id number] DOB: 09-02-1965 DOA: 11/25/2015  PCP: [name], MD Patient coming from: home  Chief Complaint: fall/dizzy  HPI: [name] is a 50 y.o. female with medical history significant for right BKA, type 2 diabetes, peripheral neuropathy, chronic kidney disease, bilateral hydronephrosis, hypertension, presents to the emergency Department chief complaint of fall related to near-syncope. Initial evaluation in the emergency department reveals orthostatic hypotension and a urinary tract infection  Information is obtained from the patient. He states over the last 2 days she's had some intermittent nausea without emesis but has not been eating or drinking her normal amount. She reports she is wheelchair-bound due to a BKA but she typically will "pull myself up from the wheelchair" I'll await switch etc. She reports doing that this morning she suddenly became very lightheaded and fell to the floor. She denies losing consciousness or hitting her head. She denies any injury. She reports being too weak to get up. Social worker came to the house the patient managed to get to the door. Social worker was unable to get her up off the floor and EMS was called. EMS systolic blood pressure was 80. She reports having a Foley catheter that she got in September as treatment for urinary retention. She acknowledges she was supposed to go for follow-up thereafter and has not made those appointments. He denies chest pain palpitations headache shortness of breath cough fever chills. She denies diarrhea constipation.  ED Course: In emergency department she's afebrile slightly tachycardic she is not hypoxic. She is provided with normal saline and Rocephin  Review of Systems: As per HPI otherwise 10 point review of systems negative.   Ambulatory Status: Wheelchair-bound due to a BKA.  Past Medical History:  Diagnosis Date  . Anemia   . Arthritis   . Diabetes  mellitus   . Diabetic foot ulcer associated with type 2 diabetes mellitus (HCC)   . Hypertension   . Intracerebral hemorrhage (HCC) As a 50    States she had burst blood vessel as teenager, now with resultant minor visual field and hearing deficits  . Neuropathy (HCC)   . Stroke [facility])     Past Surgical History:  Procedure Laterality Date  . AMPUTATION  11/24/2010   Procedure: AMPUTATION FOOT;  Surgeon: [name], MD;  Location: [facility] OR;  Service: Orthopedics;  Laterality: Right;  Right  FOOT TRANS METATARSAL AMPUTATION  . AMPUTATION  07/02/2011   Procedure: AMPUTATION BELOW KNEE;  Surgeon: [name], MD;  Location: [facility] OR;  Service: Orthopedics;  Laterality: Right;  . APPLICATION OF WOUND VAC  07/02/2011   Procedure: APPLICATION OF WOUND VAC;  Surgeon: [name], MD;  Location: [facility] OR;  Service: Orthopedics;  Laterality: Right;  . BRAIN SURGERY  1980's   Previous brain surgery in [place], [place]  . CESAREAN SECTION    . COLONOSCOPY N/A 09/17/2015   Procedure: COLONOSCOPY;  Surgeon: Charlott Rakes, MD;  Location: [facility] ENDOSCOPY;  Service: Endoscopy;  Laterality: N/A;  . ESOPHAGOGASTRODUODENOSCOPY N/A 09/17/2015   Procedure: ESOPHAGOGASTRODUODENOSCOPY (EGD);  Surgeon: Charlott Rakes, MD;  Location: [facility] ENDOSCOPY;  Service: Endoscopy;  Laterality: N/A;  . I&D EXTREMITY  11/24/2010   Procedure: IRRIGATION AND DEBRIDEMENT EXTREMITY;  Surgeon: [name], MD;  Location: [facility] OR;  Service: Orthopedics;  Laterality: Left;  Debriedment of left plantar ulcer and trimming of toenails  . I&D EXTREMITY  07/02/2011   Procedure: IRRIGATION AND DEBRIDEMENT EXTREMITY;  Surgeon: [name], MD;  Location:  [facility] OR;  Service: Orthopedics;  Laterality: Left;  I & D of Left Foot  . I&D EXTREMITY  07/06/2011   Procedure: IRRIGATION AND DEBRIDEMENT EXTREMITY;  Surgeon: Toni ArthursJohn Hewitt, MD;  Location: [facility] OR;  Service: Orthopedics;  Laterality: Right;  IRRIGATION/DEBRIDEMENT RIGHT BELOW KNEE AMPUTATION    Social  History   Social History  . Marital status: Married    Spouse name: N/A  . Number of children: N/A  . Years of education: N/A   Occupational History  . Not on file.   Social History Main Topics  . Smoking status: Never Smoker  . Smokeless tobacco: Never Used  . Alcohol use Yes     Comment: occasionally  . Drug use: No  . Sexual activity: Not on file   Other Topics Concern  . Not on file   Social History Narrative   Has a son and daughter     No Known Allergies  Family History  Problem Relation Age of Onset  . Diabetes Mother   . Hypertension Mother   . Hyperlipidemia Mother   . Diabetes Father   . Cancer Father     prostate  . Hyperlipidemia Father   . Hypertension Father   . Diabetes Brother   . Peripheral Artery Disease Brother     has had toes amputated    Prior to Admission medications   Medication Sig Start Date End Date Taking? Authorizing Provider  acetaminophen (TYLENOL) 500 MG tablet Take 1,000 mg by mouth every 6 (six) hours as needed for moderate pain.    Historical Provider, MD  amitriptyline (ELAVIL) 10 MG tablet Take 1 tablet (10 mg total) by mouth at bedtime. 11/05/15   Ankit Karis JubaAnil Patel, MD  B Complex-C (B-COMPLEX WITH VITAMIN C) tablet Take 1 tablet by mouth daily. 09/19/15   Jacquelynn CreePamela S Love, PA-C  calcium carbonate (TUMS - DOSED IN MG ELEMENTAL CALCIUM) 500 MG chewable tablet Chew 2 tablets (400 mg of elemental calcium total) by mouth 3 (three) times daily. 09/18/15   Jacquelynn CreePamela S Love, PA-C  famotidine (PEPCID) 20 MG tablet Take 1 tablet (20 mg total) by mouth 2 (two) times daily. 09/18/15   Jacquelynn CreePamela S Love, PA-C  ferrous sulfate 325 (65 FE) MG tablet Take 1 tablet (325 mg total) by mouth 2 (two) times daily with a meal. 09/18/15   Evlyn KannerPamela S Love, PA-C  gabapentin (NEURONTIN) 100 MG capsule Take 2 capsules (200 mg total) by mouth daily. 11/05/15   Ankit Karis JubaAnil Patel, MD  Menthol, Topical Analgesic, (ICY HOT EX) Apply 1 application topically 2 (two) times daily as  needed (pain).    Historical Provider, MD  Naphazoline HCl (CLEAR EYES OP) Place 1 drop into both eyes daily as needed (dry eyes/itching).    Historical Provider, MD  prochlorperazine (COMPAZINE) 5 MG tablet Take 1-2 tablets (5-10 mg total) by mouth every 8 (eight) hours as needed for nausea. 09/18/15   Jacquelynn CreePamela S Love, PA-C    Physical Exam: Vitals:   11/25/15 1430 11/25/15 1515 11/25/15 1600 11/25/15 1630  BP: 151/93 128/82 145/81 156/84  Pulse: 97 98 98 98  Resp: 10 23 16 12   Temp:      TempSrc:      SpO2: 100% 100% 100% 98%  Weight:      Height:         General:  Appears calm and comfortable Appears Eyes:  PERRL, EOMI, normal lids, iris ENT:  grossly normal hearing, lips & tongue, mucus membranes of her  mouth are moist and pink Neck:  no LAD, masses or thyromegaly Cardiovascular:  Tachycardic but regular, no m/r/g. trace LE edema on left Respiratory:  CTA bilaterally, no w/r/r. Normal respiratory effort. Abdomen:  soft, ntnd, obese positive bowel sounds no guarding or rebounding Skin:  no rash or induration seen on limited exam Musculoskeletal:  grossly normal tone BUE/BLE, good ROM, no bony abnormality Psychiatric:  grossly normal mood and affect, speech fluent and appropriate, AOx3 Neurologic:  CN 2-12 grossly intact, moves all extremities in coordinated fashion, sensation intact  Labs on Admission: I have personally reviewed following labs and imaging studies  CBC:  Recent Labs Lab 11/25/15 1351  WBC 9.0  NEUTROABS 5.8  HGB 13.7  HCT 41.2  MCV 82.9  PLT 130*   Basic Metabolic Panel:  Recent Labs Lab 11/25/15 1351  NA 130*  K 3.6  CL 93*  CO2 23  GLUCOSE 236*  BUN 53*  CREATININE 2.89*  CALCIUM 9.5   GFR: Estimated Creatinine Clearance: 26.5 mL/min (by C-G formula based on SCr of 2.89 mg/dL (H)). Liver Function Tests:  Recent Labs Lab 11/25/15 1351  AST 26  ALT 24  ALKPHOS 97  BILITOT 1.0  PROT 7.9  ALBUMIN 4.2    Recent Labs Lab  11/25/15 1351  LIPASE 25   No results for input(s): AMMONIA in the last 168 hours. Coagulation Profile: No results for input(s): INR, PROTIME in the last 168 hours. Cardiac Enzymes: No results for input(s): CKTOTAL, CKMB, CKMBINDEX, TROPONINI in the last 168 hours. BNP (last 3 results) No results for input(s): PROBNP in the last 8760 hours. HbA1C: No results for input(s): HGBA1C in the last 72 hours. CBG: No results for input(s): GLUCAP in the last 168 hours. Lipid Profile: No results for input(s): CHOL, HDL, LDLCALC, TRIG, CHOLHDL, LDLDIRECT in the last 72 hours. Thyroid Function Tests: No results for input(s): TSH, T4TOTAL, FREET4, T3FREE, THYROIDAB in the last 72 hours. Anemia Panel: No results for input(s): VITAMINB12, FOLATE, FERRITIN, TIBC, IRON, RETICCTPCT in the last 72 hours. Urine analysis:    Component Value Date/Time   COLORURINE YELLOW 11/25/2015 1449   APPEARANCEUR TURBID (A) 11/25/2015 1449   LABSPEC 1.019 11/25/2015 1449   PHURINE 5.5 11/25/2015 1449   GLUCOSEU NEGATIVE 11/25/2015 1449   HGBUR LARGE (A) 11/25/2015 1449   BILIRUBINUR SMALL (A) 11/25/2015 1449   KETONESUR NEGATIVE 11/25/2015 1449   PROTEINUR 100 (A) 11/25/2015 1449   UROBILINOGEN 2.0 (H) 07/08/2011 0051   NITRITE NEGATIVE 11/25/2015 1449   LEUKOCYTESUR LARGE (A) 11/25/2015 1449    Creatinine Clearance: Estimated Creatinine Clearance: 26.5 mL/min (by C-G formula based on SCr of 2.89 mg/dL (H)).  Sepsis Labs: @LABRCNTIP (procalcitonin:4,lacticidven:4) )No results found for this or any previous visit (from the past 240 hour(s)).   Radiological Exams on Admission: No results found.  EKG: Independently reviewed. Sinus tachycardia Biatrial enlargement Probable left ventricular hypertrophy prolonged QT  Assessment/Plan Principal Problem:   Near syncope Active Problems:   Hypertension   Acute on chronic renal failure (HCC)   DM type 2 with diabetic peripheral neuropathy (HCC)   Medical  non-compliance   Anemia of chronic disease   Dehydration   Orthostatic hypotension   UTI (urinary tract infection)   #1. Near-syncope. Likely secondary to orthostatic hypotension in the setting of urinary tract infection mild dehydration acute on chronic renal failure. Positive orthostatics in the emergency department,  Creatinine had been 2.8 up from 1.32 months ago, urinalysis consistent with UTI. Lactic acid within the limits  of normal leukocytosis -Admit to telemetry -IV fluids -Continue Rocephin initiated in the emergency department -Follow urine culture -Hold nephrotoxins -Monitor urine output -Check orthostatic vital signs in the morning -BMET in a.m.  #2. Urinary tract infection. Patient with a history of urinary retention and has had a Foley for the last 2 months. Is noncompliant with her urology follow-up. She states she has an appointment next week.. The catheter was changed while in the emergency department -IV fluids -Follow urine culture -Rocephin as noted above -Confirm urology appointment for next week  #3. Orthostatic hypotension. Clearly related to decreased oral intake in the setting of urinary tract infection and medications which include hydrocodone, Neurontin, amitriptyline -IV fluids -Antibiotics as noted above -Recheck in the morning  #4. Acute on chronic kidney disease. Stage III. On admission creatinine 2.8. Baseline appears to be 1.3. -IV fluids as noted above -Hold nephrotoxins -Monitor urine output -Recheck in the morning  5. Diabetes. Non compliant. She does not check her glucose or take any medications. Serum glucose 236 on admission. -History of noncompliance -Obtain a hemoglobin A1c -Sliding scale insulin or optimal control -We'll request a diabetic coordinator consult as I'm not sure the patient has equipment and/or knowledge base to manage her diabetes much less ability to follow-up with PCP  6. Medical noncompliance. Patient with a history of  not following up with provider appointments medications monitoring of diabetes. Unsure of what the barrier is I.e. Medications, transportation, medical issues, knowledge? -social work -case management   DVT prophylaxis: heparin  Code Status: full  Family Communication: none present  Disposition Plan: home Consults called: none  Admission status: full    Gwenyth BenderBLACK,Adrienne Delay M MD [facility]  If 7PM-7AM, please contact night-coverage [email or web address] Password [email or web address]  11/25/2015, 5:05 PM

## 2015-11-25 NOTE — ED Triage Notes (Signed)
PT is wheelchair bound. PT pulled herself to a standing position to reach something, felt weak and dizzy, and fell to the floor. PT denies LOC. PT denies hitting her head. PT remembers event. PT denies any pain at this time. PT was found on the floor by her Child psychotherapistsocial worker. Per EMS, initial BP is 80/60. PT has 100cc NS in route.

## 2015-11-25 NOTE — ED Notes (Signed)
Danford BadKristie, RN accepts report at this time.

## 2015-11-26 DIAGNOSIS — E1165 Type 2 diabetes mellitus with hyperglycemia: Secondary | ICD-10-CM | POA: Diagnosis present

## 2015-11-26 DIAGNOSIS — N390 Urinary tract infection, site not specified: Principal | ICD-10-CM

## 2015-11-26 DIAGNOSIS — Z89511 Acquired absence of right leg below knee: Secondary | ICD-10-CM | POA: Diagnosis not present

## 2015-11-26 DIAGNOSIS — E1142 Type 2 diabetes mellitus with diabetic polyneuropathy: Secondary | ICD-10-CM

## 2015-11-26 DIAGNOSIS — N170 Acute kidney failure with tubular necrosis: Secondary | ICD-10-CM | POA: Diagnosis not present

## 2015-11-26 DIAGNOSIS — Z833 Family history of diabetes mellitus: Secondary | ICD-10-CM | POA: Diagnosis not present

## 2015-11-26 DIAGNOSIS — N184 Chronic kidney disease, stage 4 (severe): Secondary | ICD-10-CM

## 2015-11-26 DIAGNOSIS — D638 Anemia in other chronic diseases classified elsewhere: Secondary | ICD-10-CM | POA: Diagnosis present

## 2015-11-26 DIAGNOSIS — R55 Syncope and collapse: Secondary | ICD-10-CM

## 2015-11-26 DIAGNOSIS — I10 Essential (primary) hypertension: Secondary | ICD-10-CM

## 2015-11-26 DIAGNOSIS — Z993 Dependence on wheelchair: Secondary | ICD-10-CM | POA: Diagnosis not present

## 2015-11-26 DIAGNOSIS — I951 Orthostatic hypotension: Secondary | ICD-10-CM | POA: Diagnosis present

## 2015-11-26 DIAGNOSIS — M1990 Unspecified osteoarthritis, unspecified site: Secondary | ICD-10-CM | POA: Diagnosis present

## 2015-11-26 DIAGNOSIS — N183 Chronic kidney disease, stage 3 (moderate): Secondary | ICD-10-CM | POA: Diagnosis present

## 2015-11-26 DIAGNOSIS — I129 Hypertensive chronic kidney disease with stage 1 through stage 4 chronic kidney disease, or unspecified chronic kidney disease: Secondary | ICD-10-CM | POA: Diagnosis present

## 2015-11-26 DIAGNOSIS — Z8249 Family history of ischemic heart disease and other diseases of the circulatory system: Secondary | ICD-10-CM | POA: Diagnosis not present

## 2015-11-26 DIAGNOSIS — Z9119 Patient's noncompliance with other medical treatment and regimen: Secondary | ICD-10-CM

## 2015-11-26 DIAGNOSIS — Z9889 Other specified postprocedural states: Secondary | ICD-10-CM | POA: Diagnosis not present

## 2015-11-26 DIAGNOSIS — Z79899 Other long term (current) drug therapy: Secondary | ICD-10-CM | POA: Diagnosis not present

## 2015-11-26 DIAGNOSIS — R000 Tachycardia, unspecified: Secondary | ICD-10-CM | POA: Diagnosis present

## 2015-11-26 DIAGNOSIS — Z8673 Personal history of transient ischemic attack (TIA), and cerebral infarction without residual deficits: Secondary | ICD-10-CM | POA: Diagnosis not present

## 2015-11-26 DIAGNOSIS — Z809 Family history of malignant neoplasm, unspecified: Secondary | ICD-10-CM | POA: Diagnosis not present

## 2015-11-26 DIAGNOSIS — R42 Dizziness and giddiness: Secondary | ICD-10-CM

## 2015-11-26 DIAGNOSIS — E860 Dehydration: Secondary | ICD-10-CM | POA: Diagnosis present

## 2015-11-26 DIAGNOSIS — T83511A Infection and inflammatory reaction due to indwelling urethral catheter, initial encounter: Secondary | ICD-10-CM | POA: Diagnosis present

## 2015-11-26 DIAGNOSIS — E1122 Type 2 diabetes mellitus with diabetic chronic kidney disease: Secondary | ICD-10-CM | POA: Diagnosis present

## 2015-11-26 DIAGNOSIS — N179 Acute kidney failure, unspecified: Secondary | ICD-10-CM | POA: Diagnosis present

## 2015-11-26 DIAGNOSIS — B952 Enterococcus as the cause of diseases classified elsewhere: Secondary | ICD-10-CM | POA: Diagnosis present

## 2015-11-26 LAB — BASIC METABOLIC PANEL
Anion gap: 12 (ref 5–15)
BUN: 41 mg/dL — ABNORMAL HIGH (ref 6–20)
CO2: 23 mmol/L (ref 22–32)
Calcium: 9.1 mg/dL (ref 8.9–10.3)
Chloride: 100 mmol/L — ABNORMAL LOW (ref 101–111)
Creatinine, Ser: 1.93 mg/dL — ABNORMAL HIGH (ref 0.44–1.00)
GFR calc Af Amer: 34 mL/min — ABNORMAL LOW (ref >60)
GFR calc non Af Amer: 29 mL/min — ABNORMAL LOW (ref >60)
Glucose, Bld: 147 mg/dL — ABNORMAL HIGH (ref 65–99)
Potassium: 3.1 mmol/L — ABNORMAL LOW (ref 3.5–5.1)
Sodium: 135 mmol/L (ref 135–145)

## 2015-11-26 LAB — CBC
HCT: 39 % (ref 36.0–46.0)
Hemoglobin: 12.8 g/dL (ref 12.0–15.0)
MCH: 26.9 pg (ref 26.0–34.0)
MCHC: 32.8 g/dL (ref 30.0–36.0)
MCV: 82.1 fL (ref 78.0–100.0)
Platelets: 109 10*3/uL — ABNORMAL LOW (ref 150–400)
RBC: 4.75 MIL/uL (ref 3.87–5.11)
RDW: 14.9 % (ref 11.5–15.5)
WBC: 6.3 10*3/uL (ref 4.0–10.5)

## 2015-11-26 LAB — GLUCOSE, CAPILLARY
Glucose-Capillary: 157 mg/dL — ABNORMAL HIGH (ref 65–99)
Glucose-Capillary: 153 mg/dL — ABNORMAL HIGH (ref 65–99)
Glucose-Capillary: 140 mg/dL — ABNORMAL HIGH (ref 65–99)
Glucose-Capillary: 131 mg/dL — ABNORMAL HIGH (ref 65–99)

## 2015-11-26 LAB — HEMOGLOBIN A1C
Hgb A1c MFr Bld: 6.5 % — ABNORMAL HIGH (ref 4.8–5.6)
Mean Plasma Glucose: 140 mg/dL

## 2015-11-26 MED ORDER — POTASSIUM CHLORIDE CRYS ER 20 MEQ PO TBCR
50.0000 meq | EXTENDED_RELEASE_TABLET | Freq: Once | ORAL | Status: AC
  Administered 2015-11-26: 50 meq via ORAL
  Filled 2015-11-26: qty 2

## 2015-11-26 NOTE — Progress Notes (Signed)
PROGRESS NOTE    Earvin HansenMichelle L Cappella  ZOX:[id number] DOB: 01/26/1965 DOA: 11/25/2015 PCP: Cain SaupeFULP, CAMMIE, MD     Brief Narrative:  50 y.o. BF PMHx CVA, Rt  BKA, DM type 2 uncontrolled with complication, Peripheral Neuropathy, CKD, Bilateral Hydronephrosis, HTN,   Presents to the emergency Department chief complaint of fall related to near-syncope. Initial evaluation in the emergency department reveals orthostatic hypotension and a urinary tract infection  Information is obtained from the patient. He states over the last 2 days she's had some intermittent nausea without emesis but has not been eating or drinking her normal amount. She reports she is wheelchair-bound due to a BKA but she typically will "pull myself up from the wheelchair" I'll await switch etc. She reports doing that this morning she suddenly became very lightheaded and fell to the floor. She denies losing consciousness or hitting her head. She denies any injury. She reports being too weak to get up. Social worker came to the house the patient managed to get to the door. Social worker was unable to get her up off the floor and EMS was called. EMS systolic blood pressure was 80. She reports having a Foley catheter that she got in September as treatment for urinary retention. She acknowledges she was supposed to go for follow-up thereafter and has not made those appointments. He denies chest pain palpitations headache shortness of breath cough fever chills. She denies diarrhea constipation.    Subjective: 11/22   A/O 4, NAD. last 24 hours afebrile. Complains of lightheadedness when she sits up abruptly.   Assessment & Plan:   Principal Problem:   Near syncope Active Problems:   Hypertension   Acute on chronic renal failure (HCC)   DM type 2 with diabetic peripheral neuropathy (HCC)   Medical non-compliance   Anemia of chronic disease   Dehydration   Orthostatic hypotension   UTI (urinary tract  infection)   Near-syncope. Likely secondary to orthostatic hypotension in the setting of urinary tract infection mild dehydration acute on chronic renal failure. Positive orthostatics in the emergency department,  Creatinine had been 2.8 up from 1.32 months ago, urinalysis consistent with UTI. Lactic acid within the limits of normal leukocytosis -Admit to telemetry -IV fluids -Continue Rocephin initiated in the emergency department -Follow urine culture -Hold nephrotoxins -Monitor urine output -Check orthostatic vital signs in the morning -BMET in a.m.  #2. Urinary tract infection. Patient with a history of urinary retention and has had a Foley for the last 2 months. Is noncompliant with her urology follow-up. She states she has an appointment next week.. The catheter was changed while in the emergency department -IV fluids -Follow urine culture -Rocephin as noted above -Confirm urology appointment for next week  #3. Orthostatic hypotension. Clearly related to decreased oral intake in the setting of urinary tract infection and medications which include hydrocodone, Neurontin, amitriptyline -IV fluids -Antibiotics as noted above -Recheck in the morning  #4. Acute on chronic kidney disease. Stage III. On admission creatinine 2.8. Baseline appears to be 1.3. -IV fluids as noted above -Hold nephrotoxins -Monitor urine output -Recheck in the morning  Diabetes type II controlled with competition.  -History of noncompliance -11/21 Hemoglobin A1c = 6.5  -Sliding scale insulin or optimal control  Hypokalemia -Potassium 50 mEq  Medical noncompliance.  -Per mother patient has been much more compliant with medical treatment. Has appointment with urologist on Monday.    DVT prophylaxis: Subcutaneous heparin Code Status: Full Family Communication: Mother and children at bedside  Disposition Plan: Home   Consultants:  None  Procedures/Significant Events:  None  VENTILATOR  SETTINGS: None   Cultures   Antimicrobials: Ceftriaxone   Devices None   LINES / TUBES:      Continuous Infusions:   Objective: Vitals:   11/26/15 0644 11/26/15 0905 11/26/15 1147 11/26/15 1727  BP: (!) 147/79  (!) 163/77 131/74  Pulse: 98  (!) 102 100  Resp: 18 18 17 17   Temp: 98.8 F (37.1 C) 98.7 F (37.1 C) 98.8 F (37.1 C) 97.6 F (36.4 C)  TempSrc: Oral Oral Oral Oral  SpO2: 98% 99% 98% 100%  Weight:      Height:        Intake/Output Summary (Last 24 hours) at 11/26/15 1745 Last data filed at 11/26/15 1633  Gross per 24 hour  Intake              915 ml  Output             1625 ml  Net             -710 ml   Filed Weights   11/25/15 1314 11/25/15 1327 11/25/15 2118  Weight: 86.2 kg (190 lb) 86.2 kg (190 lb) 81.3 kg (179 lb 3.7 oz)    Examination:  General: A/O 4, NAD, No acute respiratory distress Eyes: negative scleral hemorrhage, negative anisocoria, negative icterus ENT: Negative Runny nose, negative gingival bleeding, Neck:  Negative scars, masses, torticollis, lymphadenopathy, JVD Lungs: Clear to auscultation bilaterally without wheezes or crackles Cardiovascular: Regular rate and rhythm without murmur gallop or rub normal S1 and S2 Abdomen: negative abdominal pain, nondistended, positive soft, bowel sounds, no rebound, no ascites, no appreciable mass positive left CVA tenderness Central nervous system:  Cranial nerves II through XII intact, tongue/uvula midline, all extremities muscle strength 5/5, sensation intact throughout,  negative dysarthria, negative expressive aphasia, negative receptive aphasia.  .     Data Reviewed: Care during the described time interval was provided by me .  I have reviewed this patient's available data, including medical history, events of note, physical examination, and all test results as part of my evaluation. I have personally reviewed and interpreted all radiology studies.  CBC:  Recent Labs Lab  11/25/15 1351 11/26/15 0557  WBC 9.0 6.3  NEUTROABS 5.8  --   HGB 13.7 12.8  HCT 41.2 39.0  MCV 82.9 82.1  PLT 130* 109*   Basic Metabolic Panel:  Recent Labs Lab 11/25/15 1351 11/26/15 0557  NA 130* 135  K 3.6 3.1*  CL 93* 100*  CO2 23 23  GLUCOSE 236* 147*  BUN 53* 41*  CREATININE 2.89* 1.93*  CALCIUM 9.5 9.1   GFR: Estimated Creatinine Clearance: 38.7 mL/min (by C-G formula based on SCr of 1.93 mg/dL (H)). Liver Function Tests:  Recent Labs Lab 11/25/15 1351  AST 26  ALT 24  ALKPHOS 97  BILITOT 1.0  PROT 7.9  ALBUMIN 4.2    Recent Labs Lab 11/25/15 1351  LIPASE 25   No results for input(s): AMMONIA in the last 168 hours. Coagulation Profile: No results for input(s): INR, PROTIME in the last 168 hours. Cardiac Enzymes: No results for input(s): CKTOTAL, CKMB, CKMBINDEX, TROPONINI in the last 168 hours. BNP (last 3 results) No results for input(s): PROBNP in the last 8760 hours. HbA1C:  Recent Labs  11/25/15 1351  HGBA1C 6.5*   CBG:  Recent Labs Lab 11/25/15 1757 11/25/15 2058 11/26/15 0824 11/26/15 1147 11/26/15 1625  GLUCAP 202*  175* 157* 153* 140*   Lipid Profile: No results for input(s): CHOL, HDL, LDLCALC, TRIG, CHOLHDL, LDLDIRECT in the last 72 hours. Thyroid Function Tests: No results for input(s): TSH, T4TOTAL, FREET4, T3FREE, THYROIDAB in the last 72 hours. Anemia Panel: No results for input(s): VITAMINB12, FOLATE, FERRITIN, TIBC, IRON, RETICCTPCT in the last 72 hours. Sepsis Labs:  Recent Labs Lab 11/25/15 1406  LATICACIDVEN 1.77    Recent Results (from the past 240 hour(s))  Urine culture     Status: None (Preliminary result)   Collection Time: 11/25/15  2:48 PM  Result Value Ref Range Status   Specimen Description URINE, CATHETERIZED  Final   Special Requests NONE  Final   Culture CULTURE REINCUBATED FOR BETTER GROWTH  Final   Report Status PENDING  Incomplete         Radiology Studies: No results  found.      Scheduled Meds: . sodium chloride   Intravenous STAT  . calcium carbonate  2 tablet Oral TID  . cefTRIAXone (ROCEPHIN)  IV  1 g Intravenous Q24H  . famotidine  20 mg Oral BID  . heparin  5,000 Units Subcutaneous Q8H  . insulin aspart  0-15 Units Subcutaneous TID WC  . insulin aspart  0-5 Units Subcutaneous QHS  . sodium chloride flush  3 mL Intravenous Q12H   Continuous Infusions:   LOS: 0 days    Time spent:40 min    Crissa Sowder, Roselind Messier, MD [facility] Pager [phone]  If 7PM-7AM, please contact night-coverage [email or web address] Password [facility] 11/26/2015, 5:45 PM

## 2015-11-27 DIAGNOSIS — E118 Type 2 diabetes mellitus with unspecified complications: Secondary | ICD-10-CM

## 2015-11-27 DIAGNOSIS — B3749 Other urogenital candidiasis: Secondary | ICD-10-CM

## 2015-11-27 DIAGNOSIS — R42 Dizziness and giddiness: Secondary | ICD-10-CM

## 2015-11-27 DIAGNOSIS — E860 Dehydration: Secondary | ICD-10-CM

## 2015-11-27 DIAGNOSIS — B952 Enterococcus as the cause of diseases classified elsewhere: Secondary | ICD-10-CM

## 2015-11-27 DIAGNOSIS — N390 Urinary tract infection, site not specified: Secondary | ICD-10-CM

## 2015-11-27 DIAGNOSIS — N3000 Acute cystitis without hematuria: Secondary | ICD-10-CM

## 2015-11-27 DIAGNOSIS — N183 Chronic kidney disease, stage 3 (moderate): Secondary | ICD-10-CM

## 2015-11-27 LAB — URINE CULTURE: Culture: >=100000 — AB

## 2015-11-27 LAB — GLUCOSE, CAPILLARY
Glucose-Capillary: 105 mg/dL — ABNORMAL HIGH (ref 65–99)
Glucose-Capillary: 147 mg/dL — ABNORMAL HIGH (ref 65–99)
Glucose-Capillary: 231 mg/dL — ABNORMAL HIGH (ref 65–99)
Glucose-Capillary: 287 mg/dL — ABNORMAL HIGH (ref 65–99)

## 2015-11-27 MED ORDER — SODIUM CHLORIDE 0.9 % IV SOLN
1.0000 g | Freq: Four times a day (QID) | INTRAVENOUS | Status: DC
  Administered 2015-11-27 – 2015-11-28 (×2): 1 g via INTRAVENOUS
  Filled 2015-11-27 (×4): qty 1000

## 2015-11-27 NOTE — Progress Notes (Signed)
PROGRESS NOTE    Cheryl HansenMichelle L Wolf  ZOX:[id number] DOB: 05/15/1965 DOA: 11/25/2015 PCP: Cain SaupeFULP, CAMMIE, MD     Brief Narrative:  50 y.o. BF PMHx CVA, Rt  BKA, DM type 2 uncontrolled with complication, Peripheral Neuropathy, CKD, Bilateral Hydronephrosis, HTN,   Presents to the emergency Department chief complaint of fall related to near-syncope. Initial evaluation in the emergency department reveals orthostatic hypotension and a urinary tract infection  Information is obtained from the patient. He states over the last 2 days she's had some intermittent nausea without emesis but has not been eating or drinking her normal amount. She reports she is wheelchair-bound due to a BKA but she typically will "pull myself up from the wheelchair" I'll await switch etc. She reports doing that this morning she suddenly became very lightheaded and fell to the floor. She denies losing consciousness or hitting her head. She denies any injury. She reports being too weak to get up. Social worker came to the house the patient managed to get to the door. Social worker was unable to get her up off the floor and EMS was called. EMS systolic blood pressure was 80. She reports having a Foley catheter that she got in September as treatment for urinary retention. She acknowledges she was supposed to go for follow-up thereafter and has not made those appointments. He denies chest pain palpitations headache shortness of breath cough fever chills. She denies diarrhea constipation.    Subjective: 11/23   A/O 4, NAD. last 24 hours afebrile. Sitting in chair, states feels better.   Assessment & Plan:   Principal Problem:   Near syncope Active Problems:   Hypertension   Acute on chronic renal failure (HCC)   DM type 2 with diabetic peripheral neuropathy (HCC)   Medical non-compliance   Anemia of chronic disease   Dehydration   Orthostatic hypotension   UTI (urinary tract infection)   Orthostatic dizziness  Urinary tract infection without hematuria   Near-syncope.  -Likely secondary to orthostatic hypotension, secondary to urosepsis and dehydration -Check orthostatic vital signs: Pending  Orthostatic hypotension.  -Most likely multifactorial,decreased PO  intake, GI, iatrogenic (hydrocodone, Neurontin, amitriptyline) -Orthostatic vitals pending, however patient was able to stand and pivot into chair  UTI positive ENTEROCOCCUS FAECALIS  -Recurrent UTI secondary to chronic indwelling catheterization for urinary retention. Had a Foley for the last 2 months.  -States has Urology appointment next week: Verified by mother..  -The catheter was changed while in the emergency department -IV fluids -DC Rocephin start Ampicillin  Acute on chronic Renal Failure Stage III. (Baseline Cr ~1.3) Lab Results  Component Value Date   CREATININE 1.93 (H) 11/26/2015   CREATININE 2.89 (H) 11/25/2015   CREATININE 1.3 (H) 09/30/2015  -Improving --Hold nephrotoxins -Strict in and out  Diabetes type II controlled with complication.  -History of noncompliance -11/21 Hemoglobin A1c = 6.5  -Moderate SSI  Hypokalemia -Potassium 50 mEq  Medical noncompliance.  -Per mother patient has been much more compliant with medical treatment. Has appointment with urologist on Monday.    DVT prophylaxis: Subcutaneous heparin Code Status: Full Family Communication: None Disposition Plan: Home   Consultants:  None  Procedures/Significant Events:  None  VENTILATOR SETTINGS: None   Cultures 11/21 urine positive ENTEROCOCCUS FAECALIS      Antimicrobials: Ceftriaxone 11/21>> 11/23 Ampicillin 11/23>>  Devices None   LINES / TUBES:      Continuous Infusions:   Objective: Vitals:   11/27/15 0010 11/27/15 0421 11/27/15 0930 11/27/15 1500  BP: Marland Kitchen(!)  161/84 (!) 168/76 93/62 126/81  Pulse: (!) 101 100 (!) 101 (!) 102  Resp: 18 17 16 18   Temp: 98.1 F (36.7 C) 97.8 F (36.6 C) 98.5 F (36.9  C) 98.1 F (36.7 C)  TempSrc: Oral Oral Oral Oral  SpO2: 99% 99% 100% 100%  Weight:      Height:        Intake/Output Summary (Last 24 hours) at 11/27/15 1629 Last data filed at 11/27/15 1400  Gross per 24 hour  Intake             1070 ml  Output             1750 ml  Net             -680 ml   Filed Weights   11/25/15 1314 11/25/15 1327 11/25/15 2118  Weight: 86.2 kg (190 lb) 86.2 kg (190 lb) 81.3 kg (179 lb 3.7 oz)    Examination:  General: A/O 4, NAD, No acute respiratory distress Eyes: negative scleral hemorrhage, negative anisocoria, negative icterus ENT: Negative Runny nose, negative gingival bleeding, Neck:  Negative scars, masses, torticollis, lymphadenopathy, JVD Lungs: Clear to auscultation bilaterally without wheezes or crackles Cardiovascular: Regular rate and rhythm without murmur gallop or rub normal S1 and S2 Abdomen: negative abdominal pain, nondistended, positive soft, bowel sounds, no rebound, no ascites, no appreciable mass positive left CVA tenderness Central nervous system:  Cranial nerves II through XII intact, tongue/uvula midline, all extremities muscle strength 5/5, sensation intact throughout,  negative dysarthria, negative expressive aphasia, negative receptive aphasia.  .     Data Reviewed: Care during the described time interval was provided by me .  I have reviewed this patient's available data, including medical history, events of note, physical examination, and all test results as part of my evaluation. I have personally reviewed and interpreted all radiology studies.  CBC:  Recent Labs Lab 11/25/15 1351 11/26/15 0557  WBC 9.0 6.3  NEUTROABS 5.8  --   HGB 13.7 12.8  HCT 41.2 39.0  MCV 82.9 82.1  PLT 130* 109*   Basic Metabolic Panel:  Recent Labs Lab 11/25/15 1351 11/26/15 0557  NA 130* 135  K 3.6 3.1*  CL 93* 100*  CO2 23 23  GLUCOSE 236* 147*  BUN 53* 41*  CREATININE 2.89* 1.93*  CALCIUM 9.5 9.1   GFR: Estimated  Creatinine Clearance: 38.7 mL/min (by C-G formula based on SCr of 1.93 mg/dL (H)). Liver Function Tests:  Recent Labs Lab 11/25/15 1351  AST 26  ALT 24  ALKPHOS 97  BILITOT 1.0  PROT 7.9  ALBUMIN 4.2    Recent Labs Lab 11/25/15 1351  LIPASE 25   No results for input(s): AMMONIA in the last 168 hours. Coagulation Profile: No results for input(s): INR, PROTIME in the last 168 hours. Cardiac Enzymes: No results for input(s): CKTOTAL, CKMB, CKMBINDEX, TROPONINI in the last 168 hours. BNP (last 3 results) No results for input(s): PROBNP in the last 8760 hours. HbA1C:  Recent Labs  11/25/15 1351  HGBA1C 6.5*   CBG:  Recent Labs Lab 11/26/15 1147 11/26/15 1625 11/26/15 2132 11/27/15 0746 11/27/15 1227  GLUCAP 153* 140* 131* 105* 147*   Lipid Profile: No results for input(s): CHOL, HDL, LDLCALC, TRIG, CHOLHDL, LDLDIRECT in the last 72 hours. Thyroid Function Tests: No results for input(s): TSH, T4TOTAL, FREET4, T3FREE, THYROIDAB in the last 72 hours. Anemia Panel: No results for input(s): VITAMINB12, FOLATE, FERRITIN, TIBC, IRON, RETICCTPCT in the last 72  hours. Sepsis Labs:  Recent Labs Lab 11/25/15 1406  LATICACIDVEN 1.77    Recent Results (from the past 240 hour(s))  Urine culture     Status: Abnormal   Collection Time: 11/25/15  2:48 PM  Result Value Ref Range Status   Specimen Description URINE, CATHETERIZED  Final   Special Requests NONE  Final   Culture >=100,000 COLONIES/mL ENTEROCOCCUS FAECALIS (A)  Final   Report Status 11/27/2015 FINAL  Final   Organism ID, Bacteria ENTEROCOCCUS FAECALIS (A)  Final      Susceptibility   Enterococcus faecalis - MIC*    AMPICILLIN <=2 SENSITIVE Sensitive     LEVOFLOXACIN 1 SENSITIVE Sensitive     NITROFURANTOIN <=16 SENSITIVE Sensitive     VANCOMYCIN 1 SENSITIVE Sensitive     * >=100,000 COLONIES/mL ENTEROCOCCUS FAECALIS         Radiology Studies: No results found.      Scheduled Meds: . calcium  carbonate  2 tablet Oral TID  . cefTRIAXone (ROCEPHIN)  IV  1 g Intravenous Q24H  . famotidine  20 mg Oral BID  . heparin  5,000 Units Subcutaneous Q8H  . insulin aspart  0-15 Units Subcutaneous TID WC  . insulin aspart  0-5 Units Subcutaneous QHS  . sodium chloride flush  3 mL Intravenous Q12H   Continuous Infusions:   LOS: 1 day    Time spent:40 min    Everlie Eble, Roselind Messier, MD [facility] Pager [phone]  If 7PM-7AM, please contact night-coverage [email or web address] Password [facility] 11/27/2015, 4:29 PM

## 2015-11-28 DIAGNOSIS — T83511D Infection and inflammatory reaction due to indwelling urethral catheter, subsequent encounter: Secondary | ICD-10-CM

## 2015-11-28 LAB — BASIC METABOLIC PANEL
Anion gap: 8 (ref 5–15)
BUN: 28 mg/dL — ABNORMAL HIGH (ref 6–20)
CO2: 25 mmol/L (ref 22–32)
Calcium: 9 mg/dL (ref 8.9–10.3)
Chloride: 101 mmol/L (ref 101–111)
Creatinine, Ser: 1.68 mg/dL — ABNORMAL HIGH (ref 0.44–1.00)
GFR calc Af Amer: 40 mL/min — ABNORMAL LOW (ref >60)
GFR calc non Af Amer: 35 mL/min — ABNORMAL LOW (ref >60)
Glucose, Bld: 164 mg/dL — ABNORMAL HIGH (ref 65–99)
Potassium: 3.6 mmol/L (ref 3.5–5.1)
Sodium: 134 mmol/L — ABNORMAL LOW (ref 135–145)

## 2015-11-28 LAB — MAGNESIUM: Magnesium: 2 mg/dL (ref 1.7–2.4)

## 2015-11-28 LAB — GLUCOSE, CAPILLARY
Glucose-Capillary: 151 mg/dL — ABNORMAL HIGH (ref 65–99)
Glucose-Capillary: 197 mg/dL — ABNORMAL HIGH (ref 65–99)

## 2015-11-28 MED ORDER — SODIUM CHLORIDE 0.9 % IV SOLN
1.0000 g | Freq: Four times a day (QID) | INTRAVENOUS | Status: DC
  Administered 2015-11-28 (×2): 1 g via INTRAVENOUS
  Filled 2015-11-28 (×3): qty 1000

## 2015-11-28 MED ORDER — AMPICILLIN 500 MG PO CAPS: 500.0000 mg | ORAL_CAPSULE | Freq: Four times a day (QID) | ORAL | 0 refills | Status: AC

## 2015-11-28 NOTE — Discharge Summary (Signed)
Physician Discharge Summary  [name]  ZOX:[id number]  DOB: Dec 10, 1965  DOA: 11/25/2015 PCP: [name], MD  Admit date: 11/25/2015 Discharge date: 11/28/2015  Admitted From: Home  Disposition:  Home   Recommendations for Outpatient Follow-up:  1. Follow up with PCP in 1-2 weeks 2. Please obtain BMP/CBC in one week 3. Please follow up with Urology   Home Health: None  Equipment/Devices: Foley   Discharge Condition: Stable  CODE STATUS: Full  Diet recommendation: Heart Healthy / Carb Modified   Brief/Interim Summary: 50 y.o.F w/ PMHx CVA, Rt  BKA, DM type 2 uncontrolled with complication, Peripheral Neuropathy, CKD, HTN and chronic foley cath for urinary retention, who presented to the ED complaining of fall related to fainting. Patient was found to have an UTI and orthostatic hypotension. She was admitted for near syncope secondary to UTI and dehydration causing orthostatic hypotension and AKI. Patient was treated with IV antibiotics. Urine cultures show enterococcus faecalis sensitive to beta lactams. Patient was started on ampicillin. Foley catheter was changed in the emergency department.  She was treated with IV fluids, kidney function improve. Blood pressure remained stable in hospital stay and patient significantly improved. She will be discharged with oral antibiotics for total of 7 days. Follow-up with primary care physician and urologist.  Subjective: Patient was seen and examined this morning. She did report feeling well. No acute events overnight.  Discharge Diagnoses:  Near syncope - resolved  Likely secondary to orthostatic hypotension due to dehydration and urosepsis.  Orthostatic hypotension - multifactorial decreased by mouth intake leading to dehydration. Multiple medications (hydrocodone, Neurontin, amitriptyline) Encourage oral hydration. Medications adjusted to be discussed with PMD  UTIs secondary to chronic indwelling Foley catheter. Urine  culture positive for Enterococcus faecalis. Sensitive to ampicillin  Ampicillin for 7 days  Follow up with Urology on Monday   Acute on chronic renal failure stage III. Baseline creatinine ~1.3. Prerenal 2/2 possible urosepsis and hyptension  Cr 1.6 today trending down  Encourage oral hydration   Diabetes type 2 controlled  A1C 6.5 - not on medications Continue carb modified diet  Follow up with PMD   Peripheral neuropathy Discontinue amitriptyline - can affect on orthostatic hypotension Continue Neurontin Follow up with PMD   Discharge Instructions  Discharge Instructions    Call MD for:  difficulty breathing, headache or visual disturbances    Complete by:  As directed    Call MD for:  extreme fatigue    Complete by:  As directed    Call MD for:  hives    Complete by:  As directed    Call MD for:  persistant dizziness or light-headedness    Complete by:  As directed    Call MD for:  persistant nausea and vomiting    Complete by:  As directed    Call MD for:  redness, tenderness, or signs of infection (pain, swelling, redness, odor or green/yellow discharge around incision site)    Complete by:  As directed    Call MD for:  severe uncontrolled pain    Complete by:  As directed    Call MD for:  temperature >100.4    Complete by:  As directed    Diet - low sodium heart healthy    Complete by:  As directed    Increase activity slowly    Complete by:  As directed        Medication List    STOP taking these medications   amitriptyline 10 MG  tablet Commonly known as:  ELAVIL     TAKE these medications   acetaminophen 500 MG tablet Commonly known as:  TYLENOL Take 1,000 mg by mouth every 6 (six) hours as needed for moderate pain.   ampicillin 500 MG capsule Commonly known as:  PRINCIPEN Take 1 capsule (500 mg total) by mouth 4 (four) times daily.   B-complex with vitamin C tablet Take 1 tablet by mouth daily.   calcium carbonate 500 MG chewable  tablet Commonly known as:  TUMS - dosed in mg elemental calcium Chew 2 tablets (400 mg of elemental calcium total) by mouth 3 (three) times daily.   CLEAR EYES OP Place 1 drop into both eyes daily as needed (dry eyes/itching).   famotidine 20 MG tablet Commonly known as:  PEPCID Take 1 tablet (20 mg total) by mouth 2 (two) times daily.   ferrous sulfate 325 (65 FE) MG tablet Take 1 tablet (325 mg total) by mouth 2 (two) times daily with a meal.   gabapentin 100 MG capsule Commonly known as:  NEURONTIN Take 2 capsules (200 mg total) by mouth daily.   ICY HOT EX Apply 1 application topically 2 (two) times daily as needed (pain).   prochlorperazine 5 MG tablet Commonly known as:  COMPAZINE Take 1-2 tablets (5-10 mg total) by mouth every 8 (eight) hours as needed for nausea.       No Known Allergies  Consultations:  None     Procedures/Studies: No results found.    Discharge Exam: Vitals:   11/28/15 0933 11/28/15 1349  BP: (!) 93/57 114/68  Pulse: 95 90  Resp: 17   Temp: 97.8 F (36.6 C)    Vitals:   11/27/15 2021 11/28/15 0530 11/28/15 0933 11/28/15 1349  BP: 138/66 135/60 (!) 93/57 114/68  Pulse: 99 95 95 90  Resp: 18 18 17    Temp: 97.9 F (36.6 C) 97.7 F (36.5 C) 97.8 F (36.6 C)   TempSrc: Oral Oral Oral   SpO2: 100% 100% 99% 100%  Weight: 86.5 kg (190 lb 11.2 oz)     Height:        General: Pt is alert, awake, not in acute distress Cardiovascular: RRR, S1/S2 +, no rubs, no gallops Respiratory: CTA bilaterally, no wheezing, no rhonchi Abdominal: Soft, NT, ND, bowel sounds + Extremities: R BKA, no edema on left side    The results of significant diagnostics from this hospitalization (including imaging, microbiology, ancillary and laboratory) are listed below for reference.     Microbiology: Recent Results (from the past 240 hour(s))  Urine culture     Status: Abnormal   Collection Time: 11/25/15  2:48 PM  Result Value Ref Range Status    Specimen Description URINE, CATHETERIZED  Final   Special Requests NONE  Final   Culture >=100,000 COLONIES/mL ENTEROCOCCUS FAECALIS (A)  Final   Report Status 11/27/2015 FINAL  Final   Organism ID, Bacteria ENTEROCOCCUS FAECALIS (A)  Final      Susceptibility   Enterococcus faecalis - MIC*    AMPICILLIN <=2 SENSITIVE Sensitive     LEVOFLOXACIN 1 SENSITIVE Sensitive     NITROFURANTOIN <=16 SENSITIVE Sensitive     VANCOMYCIN 1 SENSITIVE Sensitive     * >=100,000 COLONIES/mL ENTEROCOCCUS FAECALIS     Labs: BNP (last 3 results)  Recent Labs  08/23/15 1407  BNP 793.5*   Basic Metabolic Panel:  Recent Labs Lab 11/25/15 1351 11/26/15 0557 11/28/15 0438  NA 130* 135 134*  K 3.6  3.1* 3.6  CL 93* 100* 101  CO2 23 23 25   GLUCOSE 236* 147* 164*  BUN 53* 41* 28*  CREATININE 2.89* 1.93* 1.68*  CALCIUM 9.5 9.1 9.0  MG  --   --  2.0   Liver Function Tests:  Recent Labs Lab 11/25/15 1351  AST 26  ALT 24  ALKPHOS 97  BILITOT 1.0  PROT 7.9  ALBUMIN 4.2    Recent Labs Lab 11/25/15 1351  LIPASE 25   No results for input(s): AMMONIA in the last 168 hours. CBC:  Recent Labs Lab 11/25/15 1351 11/26/15 0557  WBC 9.0 6.3  NEUTROABS 5.8  --   HGB 13.7 12.8  HCT 41.2 39.0  MCV 82.9 82.1  PLT 130* 109*   Cardiac Enzymes: No results for input(s): CKTOTAL, CKMB, CKMBINDEX, TROPONINI in the last 168 hours. BNP: Invalid input(s): POCBNP CBG:  Recent Labs Lab 11/27/15 1227 11/27/15 1626 11/27/15 2014 11/28/15 0751 11/28/15 1150  GLUCAP 147* 231* 287* 151* 197*   D-Dimer No results for input(s): DDIMER in the last 72 hours. Hgb A1c No results for input(s): HGBA1C in the last 72 hours. Lipid Profile No results for input(s): CHOL, HDL, LDLCALC, TRIG, CHOLHDL, LDLDIRECT in the last 72 hours. Thyroid function studies No results for input(s): TSH, T4TOTAL, T3FREE, THYROIDAB in the last 72 hours.  Invalid input(s): FREET3 Anemia work up No results for  input(s): VITAMINB12, FOLATE, FERRITIN, TIBC, IRON, RETICCTPCT in the last 72 hours. Urinalysis    Component Value Date/Time   COLORURINE YELLOW 11/25/2015 1449   APPEARANCEUR TURBID (A) 11/25/2015 1449   LABSPEC 1.019 11/25/2015 1449   PHURINE 5.5 11/25/2015 1449   GLUCOSEU NEGATIVE 11/25/2015 1449   HGBUR LARGE (A) 11/25/2015 1449   BILIRUBINUR SMALL (A) 11/25/2015 1449   KETONESUR NEGATIVE 11/25/2015 1449   PROTEINUR 100 (A) 11/25/2015 1449   UROBILINOGEN 2.0 (H) 07/08/2011 0051   NITRITE NEGATIVE 11/25/2015 1449   LEUKOCYTESUR LARGE (A) 11/25/2015 1449   Sepsis Labs Invalid input(s): PROCALCITONIN,  WBC,  LACTICIDVEN Microbiology Recent Results (from the past 240 hour(s))  Urine culture     Status: Abnormal   Collection Time: 11/25/15  2:48 PM  Result Value Ref Range Status   Specimen Description URINE, CATHETERIZED  Final   Special Requests NONE  Final   Culture >=100,000 COLONIES/mL ENTEROCOCCUS FAECALIS (A)  Final   Report Status 11/27/2015 FINAL  Final   Organism ID, Bacteria ENTEROCOCCUS FAECALIS (A)  Final      Susceptibility   Enterococcus faecalis - MIC*    AMPICILLIN <=2 SENSITIVE Sensitive     LEVOFLOXACIN 1 SENSITIVE Sensitive     NITROFURANTOIN <=16 SENSITIVE Sensitive     VANCOMYCIN 1 SENSITIVE Sensitive     * >=100,000 COLONIES/mL ENTEROCOCCUS FAECALIS    Time coordinating discharge: Over 30 minutes  SIGNED:  Latrelle DodrillEdwin Silva, MD  [facility] 11/28/2015, 2:14 PM Pager   If 7PM-7AM, please contact night-coverage [email or web address] Password [email or web address]

## 2015-11-28 NOTE — Progress Notes (Signed)
Patient had questions about her discharged and MD was notified. MD called into patients room and discussed all her concerns. Patient was satisfied after communicating with MD. Discharge teaching done at this time and patient is receptive to teaching.

## 2016-01-07 ENCOUNTER — Encounter: Payer: Medicaid Other | Admitting: Physical Medicine & Rehabilitation

## 2016-01-13 ENCOUNTER — Other Ambulatory Visit: Payer: Medicaid Other

## 2017-03-02 ENCOUNTER — Other Ambulatory Visit: Payer: Self-pay

## 2017-03-02 ENCOUNTER — Inpatient Hospital Stay (HOSPITAL_COMMUNITY)
Admission: EM | Admit: 2017-03-02 | Discharge: 2017-03-08 | DRG: 534 | Disposition: A | Discharge status: Skilled Nursing Facility | Payer: Self-pay | Attending: Student | Admitting: Student

## 2017-03-02 ENCOUNTER — Emergency Department (HOSPITAL_COMMUNITY): Payer: Self-pay

## 2017-03-02 ENCOUNTER — Encounter (HOSPITAL_COMMUNITY): Payer: Self-pay | Admitting: *Deleted

## 2017-03-02 DIAGNOSIS — S72451A Displaced supracondylar fracture without intracondylar extension of lower end of right femur, initial encounter for closed fracture: Principal | ICD-10-CM | POA: Diagnosis present

## 2017-03-02 DIAGNOSIS — Z653 Problems related to other legal circumstances: Secondary | ICD-10-CM

## 2017-03-02 DIAGNOSIS — M479 Spondylosis, unspecified: Secondary | ICD-10-CM | POA: Diagnosis present

## 2017-03-02 DIAGNOSIS — Z89519 Acquired absence of unspecified leg below knee: Secondary | ICD-10-CM

## 2017-03-02 DIAGNOSIS — D696 Thrombocytopenia, unspecified: Secondary | ICD-10-CM | POA: Diagnosis present

## 2017-03-02 DIAGNOSIS — Z79899 Other long term (current) drug therapy: Secondary | ICD-10-CM

## 2017-03-02 DIAGNOSIS — E1151 Type 2 diabetes mellitus with diabetic peripheral angiopathy without gangrene: Secondary | ICD-10-CM | POA: Diagnosis present

## 2017-03-02 DIAGNOSIS — R0902 Hypoxemia: Secondary | ICD-10-CM

## 2017-03-02 DIAGNOSIS — E639 Nutritional deficiency, unspecified: Secondary | ICD-10-CM | POA: Diagnosis present

## 2017-03-02 DIAGNOSIS — I129 Hypertensive chronic kidney disease with stage 1 through stage 4 chronic kidney disease, or unspecified chronic kidney disease: Secondary | ICD-10-CM | POA: Diagnosis present

## 2017-03-02 DIAGNOSIS — R402413 Glasgow coma scale score 13-15, at hospital admission: Secondary | ICD-10-CM | POA: Diagnosis present

## 2017-03-02 DIAGNOSIS — Z8673 Personal history of transient ischemic attack (TIA), and cerebral infarction without residual deficits: Secondary | ICD-10-CM

## 2017-03-02 DIAGNOSIS — E1121 Type 2 diabetes mellitus with diabetic nephropathy: Secondary | ICD-10-CM | POA: Diagnosis present

## 2017-03-02 DIAGNOSIS — R000 Tachycardia, unspecified: Secondary | ICD-10-CM | POA: Diagnosis present

## 2017-03-02 DIAGNOSIS — D631 Anemia in chronic kidney disease: Secondary | ICD-10-CM | POA: Diagnosis present

## 2017-03-02 DIAGNOSIS — F329 Major depressive disorder, single episode, unspecified: Secondary | ICD-10-CM | POA: Diagnosis present

## 2017-03-02 DIAGNOSIS — Z5320 Procedure and treatment not carried out because of patient's decision for unspecified reasons: Secondary | ICD-10-CM | POA: Diagnosis not present

## 2017-03-02 DIAGNOSIS — Z91199 Patient's noncompliance with other medical treatment and regimen due to unspecified reason: Secondary | ICD-10-CM

## 2017-03-02 DIAGNOSIS — N189 Chronic kidney disease, unspecified: Secondary | ICD-10-CM | POA: Diagnosis present

## 2017-03-02 DIAGNOSIS — E1165 Type 2 diabetes mellitus with hyperglycemia: Secondary | ICD-10-CM | POA: Diagnosis present

## 2017-03-02 DIAGNOSIS — N319 Neuromuscular dysfunction of bladder, unspecified: Secondary | ICD-10-CM | POA: Diagnosis present

## 2017-03-02 DIAGNOSIS — D638 Anemia in other chronic diseases classified elsewhere: Secondary | ICD-10-CM | POA: Diagnosis present

## 2017-03-02 DIAGNOSIS — Y92009 Unspecified place in unspecified non-institutional (private) residence as the place of occurrence of the external cause: Secondary | ICD-10-CM

## 2017-03-02 DIAGNOSIS — I10 Essential (primary) hypertension: Secondary | ICD-10-CM | POA: Diagnosis present

## 2017-03-02 DIAGNOSIS — T148XXA Other injury of unspecified body region, initial encounter: Secondary | ICD-10-CM

## 2017-03-02 DIAGNOSIS — E118 Type 2 diabetes mellitus with unspecified complications: Secondary | ICD-10-CM | POA: Diagnosis present

## 2017-03-02 DIAGNOSIS — Z89511 Acquired absence of right leg below knee: Secondary | ICD-10-CM

## 2017-03-02 DIAGNOSIS — G629 Polyneuropathy, unspecified: Secondary | ICD-10-CM | POA: Diagnosis present

## 2017-03-02 DIAGNOSIS — S72491A Other fracture of lower end of right femur, initial encounter for closed fracture: Secondary | ICD-10-CM

## 2017-03-02 DIAGNOSIS — E1122 Type 2 diabetes mellitus with diabetic chronic kidney disease: Secondary | ICD-10-CM | POA: Diagnosis present

## 2017-03-02 DIAGNOSIS — M549 Dorsalgia, unspecified: Secondary | ICD-10-CM | POA: Diagnosis present

## 2017-03-02 DIAGNOSIS — E041 Nontoxic single thyroid nodule: Secondary | ICD-10-CM | POA: Diagnosis present

## 2017-03-02 DIAGNOSIS — Z9119 Patient's noncompliance with other medical treatment and regimen: Secondary | ICD-10-CM

## 2017-03-02 DIAGNOSIS — M19042 Primary osteoarthritis, left hand: Secondary | ICD-10-CM | POA: Diagnosis present

## 2017-03-02 DIAGNOSIS — R339 Retention of urine, unspecified: Secondary | ICD-10-CM | POA: Diagnosis present

## 2017-03-02 DIAGNOSIS — R509 Fever, unspecified: Secondary | ICD-10-CM

## 2017-03-02 DIAGNOSIS — S7290XA Unspecified fracture of unspecified femur, initial encounter for closed fracture: Secondary | ICD-10-CM | POA: Diagnosis present

## 2017-03-02 DIAGNOSIS — E559 Vitamin D deficiency, unspecified: Secondary | ICD-10-CM | POA: Diagnosis present

## 2017-03-02 DIAGNOSIS — Z993 Dependence on wheelchair: Secondary | ICD-10-CM

## 2017-03-02 DIAGNOSIS — D649 Anemia, unspecified: Secondary | ICD-10-CM

## 2017-03-02 DIAGNOSIS — Z978 Presence of other specified devices: Secondary | ICD-10-CM

## 2017-03-02 DIAGNOSIS — D62 Acute posthemorrhagic anemia: Secondary | ICD-10-CM | POA: Diagnosis present

## 2017-03-02 DIAGNOSIS — W050XXA Fall from non-moving wheelchair, initial encounter: Secondary | ICD-10-CM | POA: Diagnosis present

## 2017-03-02 DIAGNOSIS — E1142 Type 2 diabetes mellitus with diabetic polyneuropathy: Secondary | ICD-10-CM | POA: Diagnosis present

## 2017-03-02 DIAGNOSIS — M19041 Primary osteoarthritis, right hand: Secondary | ICD-10-CM | POA: Diagnosis present

## 2017-03-02 DIAGNOSIS — E871 Hypo-osmolality and hyponatremia: Secondary | ICD-10-CM | POA: Diagnosis present

## 2017-03-02 DIAGNOSIS — N183 Chronic kidney disease, stage 3 (moderate): Secondary | ICD-10-CM | POA: Diagnosis present

## 2017-03-02 DIAGNOSIS — N390 Urinary tract infection, site not specified: Secondary | ICD-10-CM | POA: Diagnosis present

## 2017-03-02 DIAGNOSIS — G4733 Obstructive sleep apnea (adult) (pediatric): Secondary | ICD-10-CM | POA: Diagnosis present

## 2017-03-02 DIAGNOSIS — I739 Peripheral vascular disease, unspecified: Secondary | ICD-10-CM | POA: Diagnosis present

## 2017-03-02 DIAGNOSIS — Z960 Presence of urogenital implants: Secondary | ICD-10-CM

## 2017-03-02 DIAGNOSIS — T83511A Infection and inflammatory reaction due to indwelling urethral catheter, initial encounter: Secondary | ICD-10-CM | POA: Diagnosis present

## 2017-03-02 DIAGNOSIS — Z8679 Personal history of other diseases of the circulatory system: Secondary | ICD-10-CM

## 2017-03-02 DIAGNOSIS — Z599 Problem related to housing and economic circumstances, unspecified: Secondary | ICD-10-CM

## 2017-03-02 HISTORY — DX: Presence of urogenital implants: Z96.0

## 2017-03-02 LAB — COMPREHENSIVE METABOLIC PANEL
ALT: 51 U/L (ref 14–54)
AST: 50 U/L — ABNORMAL HIGH (ref 15–41)
Albumin: 3.9 g/dL (ref 3.5–5.0)
Alkaline Phosphatase: 75 U/L (ref 38–126)
Anion gap: 11 (ref 5–15)
BUN: 18 mg/dL (ref 6–20)
CO2: 20 mmol/L — ABNORMAL LOW (ref 22–32)
Calcium: 8.9 mg/dL (ref 8.9–10.3)
Chloride: 100 mmol/L — ABNORMAL LOW (ref 101–111)
Creatinine, Ser: 1.26 mg/dL — ABNORMAL HIGH (ref 0.44–1.00)
GFR calc Af Amer: 57 mL/min — ABNORMAL LOW (ref >60)
GFR calc non Af Amer: 49 mL/min — ABNORMAL LOW (ref >60)
Glucose, Bld: 125 mg/dL — ABNORMAL HIGH (ref 65–99)
Potassium: 5.3 mmol/L — ABNORMAL HIGH (ref 3.5–5.1)
Sodium: 131 mmol/L — ABNORMAL LOW (ref 135–145)
Total Bilirubin: 1.1 mg/dL (ref 0.3–1.2)
Total Protein: 7.8 g/dL (ref 6.5–8.1)

## 2017-03-02 LAB — CBC WITH DIFFERENTIAL/PLATELET
Basophils Absolute: 0 10*3/uL (ref 0.0–0.1)
Basophils Relative: 0 %
Eosinophils Absolute: 0.1 10*3/uL (ref 0.0–0.7)
Eosinophils Relative: 2 %
HCT: 30 % — ABNORMAL LOW (ref 36.0–46.0)
Hemoglobin: 9.7 g/dL — ABNORMAL LOW (ref 12.0–15.0)
Lymphocytes Relative: 11 %
Lymphs Abs: 0.6 10*3/uL — ABNORMAL LOW (ref 0.7–4.0)
MCH: 31 pg (ref 26.0–34.0)
MCHC: 32.3 g/dL (ref 30.0–36.0)
MCV: 95.8 fL (ref 78.0–100.0)
Monocytes Absolute: 0.5 10*3/uL (ref 0.1–1.0)
Monocytes Relative: 10 %
Neutro Abs: 4 10*3/uL (ref 1.7–7.7)
Neutrophils Relative %: 77 %
Platelets: 78 10*3/uL — ABNORMAL LOW (ref 150–400)
RBC: 3.13 MIL/uL — ABNORMAL LOW (ref 3.87–5.11)
RDW: 14.1 % (ref 11.5–15.5)
WBC: 5.1 10*3/uL (ref 4.0–10.5)

## 2017-03-02 LAB — URINALYSIS, ROUTINE W REFLEX MICROSCOPIC
Glucose, UA: NEGATIVE mg/dL
Ketones, ur: 15 mg/dL — AB
Nitrite: NEGATIVE
Protein, ur: 30 mg/dL — AB
Specific Gravity, Urine: 1.01 (ref 1.005–1.030)
pH: >9 — ABNORMAL HIGH (ref 5.0–8.0)

## 2017-03-02 LAB — URINALYSIS, MICROSCOPIC (REFLEX)

## 2017-03-02 LAB — I-STAT BETA HCG BLOOD, ED (MC, WL, AP ONLY): I-stat hCG, quantitative: <5 m[IU]/mL (ref <5)

## 2017-03-02 LAB — I-STAT CG4 LACTIC ACID, ED: Lactic Acid, Venous: 1.36 mmol/L (ref 0.5–1.9)

## 2017-03-02 MED ORDER — MORPHINE SULFATE (PF) 4 MG/ML IV SOLN: 4.0000 mg | Freq: Four times a day (QID) | INTRAVENOUS | Status: DC | PRN

## 2017-03-02 MED ORDER — PIPERACILLIN-TAZOBACTAM 3.375 G IVPB 30 MIN: 3.3750 g | Freq: Once | INTRAVENOUS | Status: DC

## 2017-03-02 MED ORDER — PROCHLORPERAZINE EDISYLATE 5 MG/ML IJ SOLN: 10.0000 mg | Freq: Once | INTRAMUSCULAR | Status: DC | PRN

## 2017-03-02 MED ORDER — SODIUM CHLORIDE 0.45 % IV SOLN: INTRAVENOUS | Status: DC

## 2017-03-02 MED ORDER — PROCHLORPERAZINE EDISYLATE 5 MG/ML IJ SOLN
10.0000 mg | Freq: Once | INTRAMUSCULAR | Status: DC | PRN
  Filled 2017-03-02: qty 2

## 2017-03-02 MED ORDER — HYDROCODONE-ACETAMINOPHEN 5-325 MG PO TABS
1.0000 | ORAL_TABLET | ORAL | Status: DC | PRN
  Administered 2017-03-03 (×2): 2 via ORAL
  Administered 2017-03-04: 1 via ORAL
  Administered 2017-03-04 – 2017-03-07 (×7): 2 via ORAL
  Filled 2017-03-02 (×2): qty 2
  Filled 2017-03-02: qty 1
  Filled 2017-03-02 (×7): qty 2

## 2017-03-02 MED ORDER — SODIUM CHLORIDE 0.9 % IV BOLUS (SEPSIS)
1000.0000 mL | Freq: Once | INTRAVENOUS | Status: AC
  Administered 2017-03-02: 1000 mL via INTRAVENOUS

## 2017-03-02 MED ORDER — SODIUM CHLORIDE 0.45 % IV SOLN
INTRAVENOUS | Status: DC
  Administered 2017-03-02: 22:00:00 via INTRAVENOUS

## 2017-03-02 MED ORDER — MORPHINE SULFATE (PF) 4 MG/ML IV SOLN: 4.0000 mg | INTRAVENOUS | Status: DC | PRN

## 2017-03-02 MED ORDER — PROCHLORPERAZINE EDISYLATE 5 MG/ML IJ SOLN
10.0000 mg | Freq: Once | INTRAMUSCULAR | Status: AC | PRN
Start: 2017-03-02 — End: 2017-03-04
  Administered 2017-03-04: 10 mg via INTRAVENOUS
  Filled 2017-03-02: qty 2

## 2017-03-02 MED ORDER — HYDROCODONE-ACETAMINOPHEN 5-325 MG PO TABS: 1.0000 | ORAL_TABLET | ORAL | Status: DC | PRN

## 2017-03-02 MED ORDER — MORPHINE SULFATE (PF) 4 MG/ML IV SOLN
4.0000 mg | Freq: Once | INTRAVENOUS | Status: AC
  Administered 2017-03-02: 4 mg via INTRAVENOUS
  Filled 2017-03-02: qty 1

## 2017-03-02 NOTE — Progress Notes (Signed)
  Pt admitted to the unit. Pt is stable, alert and oriented per baseline. Oriented to room, staff, and call bell. Educated to call for any assistance. Bed in lowest position, call bell within reach- will continue to monitor. 

## 2017-03-02 NOTE — ED Notes (Signed)
Labs and results reviewed.  Upgraded acuity, will prioritize rooming.

## 2017-03-02 NOTE — Progress Notes (Signed)
Orthopedic Tech Progress Note Patient Details:  Earvin HansenMichelle L Lavallee 08/08/1965 [id number]  Ortho Devices Type of Ortho Device: Ace wrap, Knee Immobilizer Ortho Device/Splint Location: rle ace wrap to rle stump. rle knee imm       Trinna PostMartinez, Shraga Custard J 03/02/2017, 9:36 PM

## 2017-03-02 NOTE — ED Notes (Signed)
IV team unable to get blood draw off of IV access.
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
## 2017-03-02 NOTE — Progress Notes (Signed)
Patient ID: Cheryl HansenMichelle L Mcclenathan, female   DOB: 02/28/1965, 52 y.o.   MRN: [id number]  Admitted to my service for pain control and social work assistance for needs verus placement  Full H&P to follow in am Pain control  I will likely have her femur evaluated by Dr. Jena GaussHaddix to see if he feels surgery indicated  Bulky wrap and knee immobilizer for now NWB RLE

## 2017-03-02 NOTE — ED Notes (Signed)
Patient transported to X-ray 

## 2017-03-02 NOTE — ED Notes (Signed)
Two unsuccessful attempts at IV access. 

## 2017-03-02 NOTE — ED Notes (Signed)
No reply when called for VS x 2.
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
## 2017-03-02 NOTE — ED Notes (Signed)
Ortho tech at bedside 

## 2017-03-03 ENCOUNTER — Other Ambulatory Visit: Payer: Self-pay

## 2017-03-03 ENCOUNTER — Inpatient Hospital Stay (HOSPITAL_COMMUNITY): Payer: Self-pay

## 2017-03-03 LAB — GLUCOSE, CAPILLARY
Glucose-Capillary: 162 mg/dL — ABNORMAL HIGH (ref 65–99)
Glucose-Capillary: 147 mg/dL — ABNORMAL HIGH (ref 65–99)
Glucose-Capillary: 141 mg/dL — ABNORMAL HIGH (ref 65–99)
Glucose-Capillary: 110 mg/dL — ABNORMAL HIGH (ref 65–99)

## 2017-03-03 LAB — POTASSIUM: Potassium: 4.8 mmol/L (ref 3.5–5.1)

## 2017-03-03 LAB — OSMOLALITY: Osmolality: 291 mOsm/kg (ref 275–295)

## 2017-03-03 LAB — HEMOGLOBIN A1C
Hgb A1c MFr Bld: 5.4 % (ref 4.8–5.6)
Mean Plasma Glucose: 108.28 mg/dL

## 2017-03-03 MED ORDER — INSULIN ASPART 100 UNIT/ML ~~LOC~~ SOLN
0.0000 [IU] | Freq: Three times a day (TID) | SUBCUTANEOUS | Status: DC
  Administered 2017-03-03 – 2017-03-05 (×4): 2 [IU] via SUBCUTANEOUS
  Administered 2017-03-05: 3 [IU] via SUBCUTANEOUS
  Administered 2017-03-06 – 2017-03-07 (×4): 2 [IU] via SUBCUTANEOUS

## 2017-03-03 MED ORDER — LABETALOL HCL 5 MG/ML IV SOLN
10.0000 mg | Freq: Four times a day (QID) | INTRAVENOUS | Status: DC | PRN
  Administered 2017-03-03: 10 mg via INTRAVENOUS
  Filled 2017-03-03 (×2): qty 4

## 2017-03-03 MED ORDER — OXYCODONE HCL 5 MG PO TABS
5.0000 mg | ORAL_TABLET | ORAL | Status: DC | PRN
  Administered 2017-03-03 – 2017-03-07 (×3): 5 mg via ORAL
  Filled 2017-03-03 (×3): qty 1

## 2017-03-03 MED ORDER — ENOXAPARIN SODIUM 40 MG/0.4ML ~~LOC~~ SOLN
40.0000 mg | SUBCUTANEOUS | Status: DC
  Administered 2017-03-03 – 2017-03-07 (×5): 40 mg via SUBCUTANEOUS
  Filled 2017-03-03 (×5): qty 0.4

## 2017-03-03 MED ORDER — POTASSIUM CHLORIDE IN NACL 20-0.9 MEQ/L-% IV SOLN
INTRAVENOUS | Status: DC
  Administered 2017-03-03 – 2017-03-08 (×3): via INTRAVENOUS
  Filled 2017-03-03 (×3): qty 1000

## 2017-03-03 MED ORDER — METHOCARBAMOL 500 MG PO TABS
500.0000 mg | ORAL_TABLET | Freq: Four times a day (QID) | ORAL | Status: DC | PRN
  Administered 2017-03-07: 500 mg via ORAL
  Filled 2017-03-03: qty 1

## 2017-03-03 MED ORDER — SODIUM CHLORIDE 0.9 % IV SOLN
1.5000 g | Freq: Three times a day (TID) | INTRAVENOUS | Status: DC
  Administered 2017-03-03 – 2017-03-07 (×12): 1.5 g via INTRAVENOUS
  Filled 2017-03-03 (×14): qty 1.5

## 2017-03-03 NOTE — Progress Notes (Signed)
NT called RN into room because pt's O2 was 88 and she was diaphoretic. RN woke patient up and assessed her breathing, resp, pulse, and cognition. Patient was very sleepy but was able to state that [facility] and the month is February. Patient stated that she did not feel well and wanted to sit up- when she did that she had an episode of emesis. Patient was cleaned up and the temp in the room was lowered to a cooler setting. A continuous pulse ox monitor was placed on the pt's finger. She was also placed on 2-3L of o2. Patient's O2 came up to 95-99%. She will be closely monitored and is resting at this time.

## 2017-03-03 NOTE — Progress Notes (Signed)
Pharmacy Antibiotic Note  Cheryl HansenMichelle L Wolf is a 52 y.o. female admitted on 03/02/2017 with UTI.  Pharmacy has been consulted for Unasyn dosing.  Has had enterococcus UTI in past.  Plan: Start Unasyn 1.5g IV Q8h Monitor clinical picture, renal function F/U C&S, abx deescalation / LOT  Will need to ensure if something grows other than enterococcus that it is sensitive to Unasyn  Height: 5\' 7"  (170.2 cm) Weight: 190 lb (86.2 kg) IBW/kg (Calculated) : 61.6  Temp (24hrs), Avg:99 F (37.2 C), Min:98.3 F (36.8 C), Max:99.8 F (37.7 C)  Recent Labs  Lab 03/02/17 1812 03/02/17 1838  WBC 5.1  --   CREATININE 1.26*  --   LATICACIDVEN  --  1.36    Estimated Creatinine Clearance: 60.2 mL/min (A) (by C-G formula based on SCr of 1.26 mg/dL (H)).    No Known Allergies  Thank you for allowing pharmacy to be a part of this patient's care.  Armandina StammerBATCHELDER,Nimai Burbach J 03/03/2017 10:49 AM

## 2017-03-03 NOTE — Progress Notes (Signed)
Pt b/p and pulse elevated tonight (systolic >160). Pulse staying around 117. On call MD called. Asked RN to add labetalol 10mg  [facility] 6 hours [facility]. Will give and monitor patient.

## 2017-03-03 NOTE — Consult Note (Addendum)
Orthopaedic Trauma Service (OTS) Consult   Patient ID: [name] MRN: [id number] DOB/AGE: 1965/09/01 52 y.o.   Reason for Consult: Right distal femur fracture, right BKA Referring Physician:  Salli Quarry, MD (ortho)   HPI: [name] is an 52 y.o.  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  Patient has been wheelchair dependent since 2013 when her BKA was performed.  She does have a prosthesis but never learned how to use it  nor had subsequent follow-up to assess its effect.  Patient was wearing her wheelchair fell out of her wheelchair onto her right leg.  She did have onset of pain however she did not present to the ED until yesterday evening.  Patient was found to have a relatively nondisplaced right distal femur fracture.  Patient was admitted to the orthopedic service.  Due to the nature of the injury it was felt that the evaluation by the orthopedic trauma service was warranted for definitive recommendations.  Patient was seen and evaluated by the orthopedic trauma service on 03/03/2017.  She has very little pain in her right flank.  She does report baseline numbness and tingling in her left lower extremity.  Normal sensation starts approximately halfway up her left lower leg.  Patient states that she has not been on any other medications and due to lack of insurance and is unable to get to doctor's appointments  Patient has an indwelling Foley catheter that was placed during her hospitalization initially in August 2017.  Her most recent catheter has not been changed since November 2017.  Patient was supposed to follow-up with urology for further evaluation but never made her appointments.  I suspect that this was due to lack of  transportation as well as lack of insurance coverage which she made mention of several times during my interview.   Previous urine cultures from September 2017 and November 2017 have grown out enterococcus  Patient lives alone in a pain to the second story in 6 years.  She does have 1 step to gain entry to her house.  She does have 2 children who are teenagers but are in foster care.  Her husband passed away several years ago as well.  Patient drinks 2-3 glasses of wine a night.  She does not smoke cigarettes.  Denies any additional drug use.  Past Medical History:  Diagnosis Date  . Anemia   . Arthritis    "spine, hands" (11/25/2015)  . Back pain    "all my back; probably 3 times/week" (11/25/2015)  . Diabetic foot ulcer associated with type 2 diabetes mellitus ([place])   . Hypertension   . Intracerebral hemorrhage ([place]) As a teenager    States she had burst blood vessel as teenager, now with resultant minor visual field and hearing deficits  . Neuropathy   . Stroke [facility]) ~ 1982   "my feeling on my RLE, right lower eye vision,  & hearing out of my right ear not the same since" (11/25/2015)  . Type II diabetes mellitus ([place])     Past Surgical History:  Procedure Laterality Date  . AMPUTATION  11/24/2010   Procedure: AMPUTATION FOOT;  Surgeon: Wylene Simmer, MD;  Location: [place];  Service: Orthopedics;  Laterality: Right;  Right  FOOT TRANS METATARSAL AMPUTATION  .  AMPUTATION  07/02/2011   Procedure: AMPUTATION BELOW KNEE;  Surgeon: Wylene Simmer, MD;  Location: [place];  Service: Orthopedics;  Laterality: Right;  . APPLICATION OF WOUND VAC  07/02/2011   Procedure: APPLICATION OF WOUND VAC;  Surgeon: Wylene Simmer, MD;  Location: [place];  Service: Orthopedics;  Laterality: Right;  . BRAIN SURGERY  1980's   Previous brain surgery in [place], [place]  . CESAREAN SECTION  2004; 2006  . COLONOSCOPY N/A 09/17/2015   Procedure: COLONOSCOPY;  Surgeon: Wilford Corner, MD;  Location: [facility] ENDOSCOPY;  Service:  Endoscopy;  Laterality: N/A;  . ESOPHAGOGASTRODUODENOSCOPY N/A 09/17/2015   Procedure: ESOPHAGOGASTRODUODENOSCOPY (EGD);  Surgeon: Wilford Corner, MD;  Location: [facility] ENDOSCOPY;  Service: Endoscopy;  Laterality: N/A;  . I&D EXTREMITY  11/24/2010   Procedure: IRRIGATION AND DEBRIDEMENT EXTREMITY;  Surgeon: Wylene Simmer, MD;  Location: [place];  Service: Orthopedics;  Laterality: Left;  Debriedment of left plantar ulcer and trimming of toenails  . I&D EXTREMITY  07/02/2011   Procedure: IRRIGATION AND DEBRIDEMENT EXTREMITY;  Surgeon: Wylene Simmer, MD;  Location: [place];  Service: Orthopedics;  Laterality: Left;  I & D of Left Foot  . I&D EXTREMITY  07/06/2011   Procedure: IRRIGATION AND DEBRIDEMENT EXTREMITY;  Surgeon: Wylene Simmer, MD;  Location: [place];  Service: Orthopedics;  Laterality: Right;  IRRIGATION/DEBRIDEMENT RIGHT BELOW KNEE AMPUTATION  . TUBAL LIGATION  2006    Family History  Problem Relation Age of Onset  . Diabetes Mother   . Hypertension Mother   . Hyperlipidemia Mother   . Diabetes Father   . Cancer Father        prostate  . Hyperlipidemia Father   . Hypertension Father   . Diabetes Brother   . Peripheral Artery Disease Brother        has had toes amputated    Social History:  reports that  has never smoked. she has never used smokeless tobacco. She reports that she drinks about 1.2 oz of alcohol per week. She reports that she does not use drugs.  Allergies: No Known Allergies  Medications: I have reviewed the patient's current medications.  Patient has not been on any medications in over a year  Results for orders placed or performed during the hospital encounter of 03/02/17 (from the past 48 hour(s))  Comprehensive metabolic panel     Status: Abnormal   Collection Time: 03/02/17  6:12 PM  Result Value Ref Range   Sodium 131 (L) 135 - 145 mmol/L   Potassium 5.3 (H) 3.5 - 5.1 mmol/L   Chloride 100 (L) 101 - 111 mmol/L   CO2 20 (L) 22 - 32 mmol/L   Glucose, Bld 125 (H)  65 - 99 mg/dL   BUN 18 6 - 20 mg/dL   Creatinine, Ser 1.26 (H) 0.44 - 1.00 mg/dL   Calcium 8.9 8.9 - 10.3 mg/dL   Total Protein 7.8 6.5 - 8.1 g/dL   Albumin 3.9 3.5 - 5.0 g/dL   AST 50 (H) 15 - 41 U/L   ALT 51 14 - 54 U/L   Alkaline Phosphatase 75 38 - 126 U/L   Total Bilirubin 1.1 0.3 - 1.2 mg/dL   GFR calc non Af Amer 49 (L) >60 mL/min   GFR calc Af Amer 57 (L) >60 mL/min    Comment: (NOTE) The eGFR has been calculated using the CKD EPI equation. This calculation has not been validated in all clinical situations. eGFR's persistently <60 mL/min signify possible Chronic Kidney Disease.  Anion gap 11 5 - 15    Comment: Performed at [facility] [address], [place], [place] [address]  CBC with Differential     Status: Abnormal   Collection Time: 03/02/17  6:12 PM  Result Value Ref Range   WBC 5.1 4.0 - 10.5 K/uL   RBC 3.13 (L) 3.87 - 5.11 MIL/uL   Hemoglobin 9.7 (L) 12.0 - 15.0 g/dL   HCT 30.0 (L) 36.0 - 46.0 %   MCV 95.8 78.0 - 100.0 fL   MCH 31.0 26.0 - 34.0 pg   MCHC 32.3 30.0 - 36.0 g/dL   RDW 14.1 11.5 - 15.5 %   Platelets 78 (L) 150 - 400 K/uL    Comment: REPEATED TO VERIFY PLATELET COUNT CONFIRMED BY SMEAR    Neutrophils Relative % 77 %   Neutro Abs 4.0 1.7 - 7.7 K/uL   Lymphocytes Relative 11 %   Lymphs Abs 0.6 (L) 0.7 - 4.0 K/uL   Monocytes Relative 10 %   Monocytes Absolute 0.5 0.1 - 1.0 K/uL   Eosinophils Relative 2 %   Eosinophils Absolute 0.1 0.0 - 0.7 K/uL   Basophils Relative 0 %   Basophils Absolute 0.0 0.0 - 0.1 K/uL    Comment: Performed at [facility], [place] [address] [place], [place] [address]  Urinalysis, Routine w reflex microscopic     Status: Abnormal   Collection Time: 03/02/17  6:28 PM  Result Value Ref Range   Color, Urine YELLOW YELLOW   APPearance TURBID (A) CLEAR   Specific Gravity, Urine 1.010 1.005 - 1.030   pH >9.0 (H) 5.0 - 8.0   Glucose, UA NEGATIVE NEGATIVE mg/dL   Hgb urine dipstick SMALL (A) NEGATIVE    Bilirubin Urine SMALL (A) NEGATIVE   Ketones, ur 15 (A) NEGATIVE mg/dL   Protein, ur 30 (A) NEGATIVE mg/dL   Nitrite NEGATIVE NEGATIVE   Leukocytes, UA SMALL (A) NEGATIVE    Comment: Performed at [facility] [address], [place], [place] [address]  Urinalysis, Microscopic (reflex)     Status: Abnormal   Collection Time: 03/02/17  6:28 PM  Result Value Ref Range   RBC / HPF 0-5 0 - 5 RBC/hpf   WBC, UA 6-30 0 - 5 WBC/hpf   Bacteria, UA MANY (A) NONE SEEN   Squamous Epithelial / LPF 0-5 (A) NONE SEEN   Urine-Other LESS THAN 10 mL OF URINE SUBMITTED     Comment: MICROSCOPIC EXAM PERFORMED ON UNCONCENTRATED URINE Performed at [facility], [place] [address], [place], [place] [address]   I-Stat beta hCG blood, ED     Status: None   Collection Time: 03/02/17  6:36 PM  Result Value Ref Range   I-stat hCG, quantitative <5.0 <5 mIU/mL   Comment 3            Comment:   GEST. AGE      CONC.  (mIU/mL)   <=1 WEEK        5 - 50     2 WEEKS       50 - 500     3 WEEKS       100 - 10,000     4 WEEKS     1,000 - 30,000        FEMALE AND NON-PREGNANT FEMALE:     LESS THAN 5 mIU/mL   I-Stat CG4 Lactic Acid, ED     Status: None   Collection Time:  03/02/17  6:38 PM  Result Value Ref Range   Lactic Acid, Venous 1.36 0.5 - 1.9 mmol/L  Potassium     Status: None   Collection Time: 03/02/17 11:23 PM  Result Value Ref Range   Potassium 4.8 3.5 - 5.1 mmol/L    Comment: Performed at [facility], [place] [address], [place],  [address]  Glucose, capillary     Status: Abnormal   Collection Time: 03/03/17  6:35 AM  Result Value Ref Range   Glucose-Capillary 162 (H) 65 - 99 mg/dL   Comment 1 Notify RN    Comment 2 Document in Chart     Dg Knee Complete 4 Views Right  Result Date: 03/02/2017 CLINICAL DATA:  Right upper leg pain since falling yesterday. Previous right below the knee amputation in 2013. History of diabetes. EXAM: RIGHT KNEE - COMPLETE 4+ VIEW COMPARISON:  Right  foot radiographs 07/01/2011 FINDINGS: Patient has undergone amputation through the proximal lower leg. There is some heterotopic ossification surrounding the remaining portions of distal tibia and fibula. There is no bone destruction. The bones are diffusely demineralized. There is an oblique, mildly displaced acute fracture of the distal femoral metadiaphysis. This demonstrates extension to the lateral epicondyle and may involve the trochlea. No definite involvement of the weight-bearing articular surface. There is a small knee joint effusion. IMPRESSION: 1. Oblique mildly displaced fracture of the distal femur as described. Possible intra-articular extension with associated small joint effusion. 2. Interval below the knee amputation without acute abnormality of the remaining tibia or fibula. Electronically Signed   By: Richardean Sale M.D.   On: 03/02/2017 17:50    Review of Systems  Constitutional: Negative for chills and fever.  Eyes: Positive for blurred vision (Chronic).  Respiratory: Negative for shortness of breath and wheezing.   Cardiovascular: Negative for chest pain and palpitations.  Gastrointestinal: Negative for abdominal pain, nausea and vomiting.  Genitourinary:       Chronic Foley catheter  Musculoskeletal:       Right leg pain  Neurological: Positive for sensory change (Chronic).   Blood pressure (!) 142/74, pulse 95, temperature 98.3 F (36.8 C), temperature source Oral, resp. rate 19, height '5\' 7"'  (1.702 m), weight 86.2 kg (190 lb), SpO2 100 %. Physical Exam  Constitutional: She is cooperative.  52 year old black female appears older than stated age Very pungent smell in room, suspect from Foley catheter She is a very pleasant individual  HENT:  Mouth/Throat: Mucous membranes are dry.  Eyes: EOM are normal.  Cardiovascular: Normal rate, regular rhythm, S1 normal and S2 normal.  Pulmonary/Chest:  Clear anterior fields No increased work of breathing  Abdominal:  Soft,  nontender, nondistended, + BS   Genitourinary:  Genitourinary Comments: Chronic foley catheter Junction of th foley and drain tube wrapped in duct tape   Musculoskeletal:  Right Lower Extremity  Inspection:   R BKA   Knee immobilizer and ace wrap in place    No open wounds, lesions or deformities   Appears comfortable  Bony eval:    Mild tenderness with palpation of R distal femur    Hip is nontender, no pain with axial loading or log rolling of hip     Tibia and stump are nontender Soft tissue:    Mild swelling to R knee and distal femur    Knee is grossly stable    BKA incision looks great    Pt has scattered healed lesion over B LEx ROM:   Tolerates  gentle knee ROM    Sensation:    Sensation to R thigh is grossly intact Motor:   Knee flexion and extension grossly intact   Hip flexion and extension intact  Left Lower Extremity  Inspection:   No gross deformities   Skin lesions as noted on other leg  Bony eval:   Nontender, no crepitus or gross motion with manipulation  Soft tissue:   No swelling   Muscle atrophy noted  Sensation:    Diminished sensation along DPN, SPN, TN distributions     Motor:   EHL, FHL, AT, PT, peroneals, gastroc motor grossly intact     Vascular:    + DP pulse    No significant swelling    Neurological: She is alert.     Assessment/Plan:  52 y/o black female with numerous medical comorbidities with R BKA and acute R distal femur fracture after fall out of wheelchair, same foley catheter has been in place >1 year. Has not been on any medications for >1 year for DM, nor abx for catheter   -fall   - minimally displaced R distal femur fracture, R BKA  Alignment looks good  NWB R leg   Pt is WC dependent. Has not attempted to ambulate on her R leg with prosthesis since her initial amputation in 2013  Feel we can treat this pt non-operatively with bracing   Have communicated with our orthotists about brace    ROM as tolerate R  knee    We feel she is a high risk for complications if surgery performed given her uncontrolled DM and overall medical noncompliance. However I think her noncompliance stems from genuine concern over not being able to afford appointments and getting to appointments as she has no one to drive her    There are extensive social issues complicating pts clinical picture    Chronic medical conditions   Death of her husband   Children in foster care   EtOH use   - Pain management:  Titrate accordingly   - ABL anemia/Hemodynamics  Stable  Monitor BPs   Noted issues over night when pt was given labetalol. Dc labetolol   - Medical issues   Renal failure, chonic foley cath   Same foley has been in place >1 year    Strong odor in room noted    Will have nurse remove    U/A looks dirty   Urine culture pending     2 previous cultures from 09/2015 and 11/2015 grew out enterococcus    Will start on Unasyn.  Pt has apparent history of QT prolongation so will not use levaquin at this time      Urology consulted      Hyponatremia    Pt was on 1/2 NS, dc an start on NS   Will check serum osmolality    DM    Check A1c    Will need to establish care at the health [facility] wellness center   [facility] mod diet   General wellness   Will need assistance with disability    ? Component of depression given all of her life stressors    outpt psychology eval if we can arrange transportation?      - DVT/PE prophylaxis:  Lovenox   - ID:   Levaquin as above  - Activity:  PT/OT evals  - FEN/GI prophylaxis/Foley/Lines:  CHO mod diet   Start NS   - Impediments to fracture healing:  DM   CKD   -  Dispo:  Therapy evals  Urology eval    Jari Pigg, PA-C [facility] [phone] (C) [phone] (O) 03/03/2017, 9:56 AM

## 2017-03-03 NOTE — Progress Notes (Signed)
Orthopedic Tech Progress Note Patient Details:  Cheryl HansenMichelle L Bensinger 05/01/1965 [id number]  Patient ID: Cheryl HansenMichelle L Aboud, female   DOB: 10/30/1965, 52 y.o.   MRN: [id number]   Saul FordyceJennifer C Harpreet Signore 03/03/2017, 11:12 AMCalled [employer] for right stump shinker and Hinged knee brace.

## 2017-03-03 NOTE — H&P (Signed)
[name] is an 52 y.o. female.    Chief Complaint: Right femur fracture in the setting of a BKA   HPI:   [name] is a 52 y.o. female who presents to the ED with complaints of mechanical fall out of her wheelchair that occurred 2 days ago.  Patient states that she was on uneven pavement when she hit a bump with her wheelchair and this caused her to fall out onto the concrete floor landing on her right knee.  She denies head injury or LOC.  She now complains of intermittent sharp nonradiating right knee pain that worsens with movement of the leg and has been mildly improved with Tylenol and icy hot.  She reports associated right knee swelling.  She has had paresthesias in both legs for the last 2 months, denies any acute changes in this issue.  Dr. [name] performed her right BKA in 2013, but she has not followed up with the orthopedist anytime recently.  She has a prosthetic for her leg however she has never been properly trained on how to use it and it has never fit correctly so she does not use it and therefore uses her wheelchair to get around.  She lives at home alone.Dr. Alvan Dame was consulted.  Patient was placed in a bulky dressing and a KI.  Dr. Alvan Dame will have Dr. Doreatha Tyner evaluate the patient to see if she may need surgery versus continue non-operative treatment.     PCP: [name], MD (Inactive)  D/C Plans:       SNF / Home with care   PMH: Past Medical History:  Diagnosis Date  . Anemia   . Arthritis    "spine, hands" (11/25/2015)  . Back pain    "all my back; probably 3 times/week" (11/25/2015)  . Diabetic foot ulcer associated with type 2 diabetes mellitus ([place])   . Hypertension   . Intracerebral hemorrhage ([place]) As a teenager    States she had burst blood vessel as teenager, now with resultant minor visual field and hearing deficits  . Neuropathy   . Stroke [facility]) ~ 1982   "my feeling on my RLE, right lower eye vision,  & hearing out of my right ear not the  same since" (11/25/2015)  . Type II diabetes mellitus ([facility])     PSH: Past Surgical History:  Procedure Laterality Date  . AMPUTATION  11/24/2010   Procedure: AMPUTATION FOOT;  Surgeon: Wylene Simmer, MD;  Location: [place];  Service: Orthopedics;  Laterality: Right;  Right  FOOT TRANS METATARSAL AMPUTATION  . AMPUTATION  07/02/2011   Procedure: AMPUTATION BELOW KNEE;  Surgeon: Wylene Simmer, MD;  Location: [place];  Service: Orthopedics;  Laterality: Right;  . APPLICATION OF WOUND VAC  07/02/2011   Procedure: APPLICATION OF WOUND VAC;  Surgeon: Wylene Simmer, MD;  Location: [place];  Service: Orthopedics;  Laterality: Right;  . BRAIN SURGERY  1980's   Previous brain surgery in [place], [place]  . CESAREAN SECTION  2004; 2006  . COLONOSCOPY N/A 09/17/2015   Procedure: COLONOSCOPY;  Surgeon: Wilford Corner, MD;  Location: [facility] ENDOSCOPY;  Service: Endoscopy;  Laterality: N/A;  . ESOPHAGOGASTRODUODENOSCOPY N/A 09/17/2015   Procedure: ESOPHAGOGASTRODUODENOSCOPY (EGD);  Surgeon: Wilford Corner, MD;  Location: [facility] ENDOSCOPY;  Service: Endoscopy;  Laterality: N/A;  . I&D EXTREMITY  11/24/2010   Procedure: IRRIGATION AND DEBRIDEMENT EXTREMITY;  Surgeon: Wylene Simmer, MD;  Location: [place];  Service: Orthopedics;  Laterality: Left;  Debriedment of left  plantar ulcer and trimming of toenails  . I&D EXTREMITY  07/02/2011   Procedure: IRRIGATION AND DEBRIDEMENT EXTREMITY;  Surgeon: Wylene Simmer, MD;  Location: [place];  Service: Orthopedics;  Laterality: Left;  I & D of Left Foot  . I&D EXTREMITY  07/06/2011   Procedure: IRRIGATION AND DEBRIDEMENT EXTREMITY;  Surgeon: Wylene Simmer, MD;  Location: [place];  Service: Orthopedics;  Laterality: Right;  IRRIGATION/DEBRIDEMENT RIGHT BELOW KNEE AMPUTATION  . TUBAL LIGATION  2006    Social History:  reports that  has never smoked. she has never used smokeless tobacco. She reports that she drinks about 1.2 oz of alcohol per week. She reports that she does not use drugs.  Allergies:    No Known Allergies  Medications: Current Facility-Administered Medications  Medication Dose Route Frequency Provider Last Rate Last Dose  . 0.45 % sodium chloride infusion   Intravenous Vallarie Mare, MD 75 mL/hr at 03/02/17 2159    . HYDROcodone-acetaminophen (NORCO/VICODIN) 5-325 MG per tablet 1-2 tablet  1-2 tablet Oral Q4H PRN Tanna Furry, MD   2 tablet at 03/03/17 0020  . labetalol (NORMODYNE,TRANDATE) injection 10 mg  10 mg Intravenous QID PRN Paralee Cancel, MD   10 mg at 03/03/17 0106  . morphine 4 MG/ML injection 4 mg  4 mg Intravenous Q6H PRN Tanna Furry, MD      . prochlorperazine (COMPAZINE) injection 10 mg  10 mg Intravenous Once PRN Paralee Cancel, MD        Results for orders placed or performed during the hospital encounter of 03/02/17 (from the past 48 hour(s))  Comprehensive metabolic panel     Status: Abnormal   Collection Time: 03/02/17  6:12 PM  Result Value Ref Range   Sodium 131 (L) 135 - 145 mmol/L   Potassium 5.3 (H) 3.5 - 5.1 mmol/L   Chloride 100 (L) 101 - 111 mmol/L   CO2 20 (L) 22 - 32 mmol/L   Glucose, Bld 125 (H) 65 - 99 mg/dL   BUN 18 6 - 20 mg/dL   Creatinine, Ser 1.26 (H) 0.44 - 1.00 mg/dL   Calcium 8.9 8.9 - 10.3 mg/dL   Total Protein 7.8 6.5 - 8.1 g/dL   Albumin 3.9 3.5 - 5.0 g/dL   AST 50 (H) 15 - 41 U/L   ALT 51 14 - 54 U/L   Alkaline Phosphatase 75 38 - 126 U/L   Total Bilirubin 1.1 0.3 - 1.2 mg/dL   GFR calc non Af Amer 49 (L) >60 mL/min   GFR calc Af Amer 57 (L) >60 mL/min    Comment: (NOTE) The eGFR has been calculated using the CKD EPI equation. This calculation has not been validated in all clinical situations. eGFR's persistently <60 mL/min signify possible Chronic Kidney Disease.    Anion gap 11 5 - 15    Comment: Performed at [facility] [address] [place], [place] [address]  CBC with Differential     Status: Abnormal   Collection Time: 03/02/17  6:12 PM  Result Value Ref Range   WBC 5.1 4.0 - 10.5 K/uL   RBC  3.13 (L) 3.87 - 5.11 MIL/uL   Hemoglobin 9.7 (L) 12.0 - 15.0 g/dL   HCT 30.0 (L) 36.0 - 46.0 %   MCV 95.8 78.0 - 100.0 fL   MCH 31.0 26.0 - 34.0 pg   MCHC 32.3 30.0 - 36.0 g/dL   RDW 14.1 11.5 - 15.5 %   Platelets 78 (L) 150 - 400  K/uL    Comment: REPEATED TO VERIFY PLATELET COUNT CONFIRMED BY SMEAR    Neutrophils Relative % 77 %   Neutro Abs 4.0 1.7 - 7.7 K/uL   Lymphocytes Relative 11 %   Lymphs Abs 0.6 (L) 0.7 - 4.0 K/uL   Monocytes Relative 10 %   Monocytes Absolute 0.5 0.1 - 1.0 K/uL   Eosinophils Relative 2 %   Eosinophils Absolute 0.1 0.0 - 0.7 K/uL   Basophils Relative 0 %   Basophils Absolute 0.0 0.0 - 0.1 K/uL    Comment: Performed at [facility] [address], [place], [place] [address]  Urinalysis, Routine w reflex microscopic     Status: Abnormal   Collection Time: 03/02/17  6:28 PM  Result Value Ref Range   Color, Urine YELLOW YELLOW   APPearance TURBID (A) CLEAR   Specific Gravity, Urine 1.010 1.005 - 1.030   pH >9.0 (H) 5.0 - 8.0   Glucose, UA NEGATIVE NEGATIVE mg/dL   Hgb urine dipstick SMALL (A) NEGATIVE   Bilirubin Urine SMALL (A) NEGATIVE   Ketones, ur 15 (A) NEGATIVE mg/dL   Protein, ur 30 (A) NEGATIVE mg/dL   Nitrite NEGATIVE NEGATIVE   Leukocytes, UA SMALL (A) NEGATIVE    Comment: Performed at [facility] [address], [place], [place] [address]  Urinalysis, Microscopic (reflex)     Status: Abnormal   Collection Time: 03/02/17  6:28 PM  Result Value Ref Range   RBC / HPF 0-5 0 - 5 RBC/hpf   WBC, UA 6-30 0 - 5 WBC/hpf   Bacteria, UA MANY (A) NONE SEEN   Squamous Epithelial / LPF 0-5 (A) NONE SEEN   Urine-Other LESS THAN 10 mL OF URINE SUBMITTED     Comment: MICROSCOPIC EXAM PERFORMED ON UNCONCENTRATED URINE Performed at [facility], [place] [address] [place], [place] [address]   I-Stat beta hCG blood, ED     Status: None   Collection Time: 03/02/17  6:36 PM  Result Value Ref Range   I-stat hCG, quantitative <5.0 <5  mIU/mL   Comment 3            Comment:   GEST. AGE      CONC.  (mIU/mL)   <=1 WEEK        5 - 50     2 WEEKS       50 - 500     3 WEEKS       100 - 10,000     4 WEEKS     1,000 - 30,000        FEMALE AND NON-PREGNANT FEMALE:     LESS THAN 5 mIU/mL   I-Stat CG4 Lactic Acid, ED     Status: None   Collection Time: 03/02/17  6:38 PM  Result Value Ref Range   Lactic Acid, Venous 1.36 0.5 - 1.9 mmol/L  Potassium     Status: None   Collection Time: 03/02/17 11:23 PM  Result Value Ref Range   Potassium 4.8 3.5 - 5.1 mmol/L    Comment: Performed at [facility], [place] [address] [place], [place] [address]  Glucose, capillary     Status: Abnormal   Collection Time: 03/03/17  6:35 AM  Result Value Ref Range   Glucose-Capillary 162 (H) 65 - 99 mg/dL   Comment 1 Notify RN    Comment 2 Document in Chart    Dg Knee Complete 4 Views Right  Result Date:  03/02/2017 CLINICAL DATA:  Right upper leg pain since falling yesterday. Previous right below the knee amputation in 2013. History of diabetes. EXAM: RIGHT KNEE - COMPLETE 4+ VIEW COMPARISON:  Right foot radiographs 07/01/2011 FINDINGS: Patient has undergone amputation through the proximal lower leg. There is some heterotopic ossification surrounding the remaining portions of distal tibia and fibula. There is no bone destruction. The bones are diffusely demineralized. There is an oblique, mildly displaced acute fracture of the distal femoral metadiaphysis. This demonstrates extension to the lateral epicondyle and may involve the trochlea. No definite involvement of the weight-bearing articular surface. There is a small knee joint effusion. IMPRESSION: 1. Oblique mildly displaced fracture of the distal femur as described. Possible intra-articular extension with associated small joint effusion. 2. Interval below the knee amputation without acute abnormality of the remaining tibia or fibula. Electronically Signed   By: Richardean Sale M.D.   On:  03/02/2017 17:50     Review of Systems  Constitutional: Negative.   HENT: Negative.   Eyes: Negative.   Respiratory: Negative.   Cardiovascular: Negative.   Gastrointestinal: Negative.   Genitourinary: Negative.        Indwelling cath  Musculoskeletal: Positive for back pain and joint pain.  Skin: Negative.   Neurological: Negative.   Endo/Heme/Allergies: Negative.   Psychiatric/Behavioral: Negative.        Physical Exam  Constitutional: She appears well-developed.  HENT:  Head: Normocephalic.  Eyes: Pupils are equal, round, and reactive to light.  Neck: Neck supple. No JVD present. No tracheal deviation present. No thyromegaly present.  Cardiovascular: Normal rate, regular rhythm and intact distal pulses.  Respiratory: Effort normal and breath sounds normal. No respiratory distress. She has no wheezes.  GI: Soft. There is no tenderness. There is no guarding.  Musculoskeletal:       Right knee: She exhibits decreased range of motion, swelling and bony tenderness. Tenderness found.  Lymphadenopathy:    She has no cervical adenopathy.  Neurological: She is alert.  Skin: Skin is warm and dry.  Psychiatric: She has a normal mood and affect.       Assessment/Plan Assessment:    Right femur fracture in the setting of a BKA   Plan: Patient was admitted to help with pain control and and further evaluation Plan to have Dr. Doreatha Gross evaluate the patient to see if she may benefit from an operation Discussed with the patient regarding treating her non-op with the KI After further evaluation will need either skilled care facility or extra assistance at home.     [place] Marlaysia Lenig   PA-C  03/03/2017, 7:24 AM

## 2017-03-03 NOTE — Consult Note (Signed)
Urology Consult  Referring physician: Dr. Doreatha Wolf Reason for referral: [name] retention, chronic indwellign foley  Chief Complaint: abdominal pain, urinary retention  History of Present Illness: Ms [name] is a 52yo with a history of neuropathy, DMII, urinary retention with bilateral hydronephrosis and currently femur fracture admitted after a fall. She is known to the urology service and last saw Dr. Louis Wolf 16 months ago. She was found to be in urinary retention in August 2017 and had a foley placed. She was lost to followup and was readmitted in November 2017 and at that time had her foley replaced. She has neglected to keep her appointment with Dr. Louis Wolf for foley changes and to work up her urinary retention. Her last foley change was in November 2017. She did have mild suprapubic/abdominal pain which has resolved after her foley was replaced today. The pain was mild dull suprapubic pain with no other associated symptoms. No exacerbating events. No hematuria. No urinary urgency with the foley in place     Past Medical History:  Diagnosis Date  . Anemia   . Arthritis    "spine, hands" (11/25/2015)  . Back pain    "all my back; probably 3 times/week" (11/25/2015)  . Diabetic foot ulcer associated with type 2 diabetes mellitus ([place])   . Hypertension   . Intracerebral hemorrhage ([place]) As a teenager    States she had burst blood vessel as teenager, now with resultant minor visual field and hearing deficits  . Neuropathy   . Stroke [facility]) ~ 1982   "my feeling on my RLE, right lower eye vision,  & hearing out of my right ear not the same since" (11/25/2015)  . Type II diabetes mellitus ([place])    Past Surgical History:  Procedure Laterality Date  . AMPUTATION  11/24/2010   Procedure: AMPUTATION FOOT;  Surgeon: Wylene Simmer, MD;  Location: [place];  Service: Orthopedics;  Laterality: Right;  Right  FOOT TRANS METATARSAL AMPUTATION  . AMPUTATION  07/02/2011   Procedure: AMPUTATION BELOW KNEE;   Surgeon: Wylene Simmer, MD;  Location: [place];  Service: Orthopedics;  Laterality: Right;  . APPLICATION OF WOUND VAC  07/02/2011   Procedure: APPLICATION OF WOUND VAC;  Surgeon: Wylene Simmer, MD;  Location: [place];  Service: Orthopedics;  Laterality: Right;  . BRAIN SURGERY  1980's   Previous brain surgery in [place], [place]  . CESAREAN SECTION  2004; 2006  . COLONOSCOPY N/A 09/17/2015   Procedure: COLONOSCOPY;  Surgeon: Wilford Corner, MD;  Location: [facility] ENDOSCOPY;  Service: Endoscopy;  Laterality: N/A;  . ESOPHAGOGASTRODUODENOSCOPY N/A 09/17/2015   Procedure: ESOPHAGOGASTRODUODENOSCOPY (EGD);  Surgeon: Wilford Corner, MD;  Location: [facility] ENDOSCOPY;  Service: Endoscopy;  Laterality: N/A;  . I&D EXTREMITY  11/24/2010   Procedure: IRRIGATION AND DEBRIDEMENT EXTREMITY;  Surgeon: Wylene Simmer, MD;  Location: [facility];  Service: Orthopedics;  Laterality: Left;  Debriedment of left plantar ulcer and trimming of toenails  . I&D EXTREMITY  07/02/2011   Procedure: IRRIGATION AND DEBRIDEMENT EXTREMITY;  Surgeon: Wylene Simmer, MD;  Location: [place];  Service: Orthopedics;  Laterality: Left;  I & D of Left Foot  . I&D EXTREMITY  07/06/2011   Procedure: IRRIGATION AND DEBRIDEMENT EXTREMITY;  Surgeon: Wylene Simmer, MD;  Location: [place];  Service: Orthopedics;  Laterality: Right;  IRRIGATION/DEBRIDEMENT RIGHT BELOW KNEE AMPUTATION  . TUBAL LIGATION  2006    Medications: I have reviewed the patient's current medications. Allergies: No Known Allergies  Family History  Problem Relation Age of Onset  .  Diabetes Mother   . Hypertension Mother   . Hyperlipidemia Mother   . Diabetes Father   . Cancer Father        prostate  . Hyperlipidemia Father   . Hypertension Father   . Diabetes Brother   . Peripheral Artery Disease Brother        has had toes amputated   Social History:  reports that  has never smoked. she has never used smokeless tobacco. She reports that she drinks about 1.2 oz of alcohol per week. She reports  that she does not use drugs.  Review of Systems  Gastrointestinal: Positive for abdominal pain.  Musculoskeletal: Positive for joint pain.  All other systems reviewed and are negative.   Physical Exam:  Vital signs in last 24 hours: Temp:  [98.3 F (36.8 C)-99.1 F (37.3 C)] 99.1 F (37.3 C) (02/28 1958) Pulse Rate:  [89-113] 103 (02/28 1958) Resp:  [16-20] 16 (02/28 1958) BP: (126-167)/(54-79) 126/55 (02/28 1958) SpO2:  [88 %-100 %] 98 % (02/28 1958) Physical Exam  Constitutional: She is oriented to person, place, and time. She appears well-developed and well-nourished.  HENT:  Head: Normocephalic and atraumatic.  Eyes: EOM are normal. Pupils are equal, round, and reactive to light.  Neck: Normal range of motion. No thyromegaly present.  Cardiovascular: Normal rate and regular rhythm.  Respiratory: Effort normal. No respiratory distress.  GI: Soft. She exhibits no distension. There is no tenderness.  Musculoskeletal: She exhibits deformity. She exhibits no edema.  Right AKA  Neurological: She is alert and oriented to person, place, and time.  Skin: Skin is warm and dry.  Psychiatric: She has a normal mood and affect. Her behavior is normal. Judgment and thought content normal.    Laboratory Data:  Results for orders placed or performed during the hospital encounter of 03/02/17 (from the past 72 hour(s))  Comprehensive metabolic panel     Status: Abnormal   Collection Time: 03/02/17  6:12 PM  Result Value Ref Range   Sodium 131 (L) 135 - 145 mmol/L   Potassium 5.3 (H) 3.5 - 5.1 mmol/L   Chloride 100 (L) 101 - 111 mmol/L   CO2 20 (L) 22 - 32 mmol/L   Glucose, Bld 125 (H) 65 - 99 mg/dL   BUN 18 6 - 20 mg/dL   Creatinine, Ser 1.26 (H) 0.44 - 1.00 mg/dL   Calcium 8.9 8.9 - 10.3 mg/dL   Total Protein 7.8 6.5 - 8.1 g/dL   Albumin 3.9 3.5 - 5.0 g/dL   AST 50 (H) 15 - 41 U/L   ALT 51 14 - 54 U/L   Alkaline Phosphatase 75 38 - 126 U/L   Total Bilirubin 1.1 0.3 - 1.2 mg/dL    GFR calc non Af Amer 49 (L) >60 mL/min   GFR calc Af Amer 57 (L) >60 mL/min    Comment: (NOTE) The eGFR has been calculated using the CKD EPI equation. This calculation has not been validated in all clinical situations. eGFR's persistently <60 mL/min signify possible Chronic Kidney Disease.    Anion gap 11 5 - 15    Comment: Performed at [facility] [address] [place], [place] [address]  CBC with Differential     Status: Abnormal   Collection Time: 03/02/17  6:12 PM  Result Value Ref Range   WBC 5.1 4.0 - 10.5 K/uL   RBC 3.13 (L) 3.87 - 5.11 MIL/uL   Hemoglobin 9.7 (L) 12.0 - 15.0 g/dL  HCT 30.0 (L) 36.0 - 46.0 %   MCV 95.8 78.0 - 100.0 fL   MCH 31.0 26.0 - 34.0 pg   MCHC 32.3 30.0 - 36.0 g/dL   RDW 14.1 11.5 - 15.5 %   Platelets 78 (L) 150 - 400 K/uL    Comment: REPEATED TO VERIFY PLATELET COUNT CONFIRMED BY SMEAR    Neutrophils Relative % 77 %   Neutro Abs 4.0 1.7 - 7.7 K/uL   Lymphocytes Relative 11 %   Lymphs Abs 0.6 (L) 0.7 - 4.0 K/uL   Monocytes Relative 10 %   Monocytes Absolute 0.5 0.1 - 1.0 K/uL   Eosinophils Relative 2 %   Eosinophils Absolute 0.1 0.0 - 0.7 K/uL   Basophils Relative 0 %   Basophils Absolute 0.0 0.0 - 0.1 K/uL    Comment: Performed at [facility] [address], [place], [place] [address]  Urinalysis, Routine w reflex microscopic     Status: Abnormal   Collection Time: 03/02/17  6:28 PM  Result Value Ref Range   Color, Urine YELLOW YELLOW   APPearance TURBID (A) CLEAR   Specific Gravity, Urine 1.010 1.005 - 1.030   pH >9.0 (H) 5.0 - 8.0   Glucose, UA NEGATIVE NEGATIVE mg/dL   Hgb urine dipstick SMALL (A) NEGATIVE   Bilirubin Urine SMALL (A) NEGATIVE   Ketones, ur 15 (A) NEGATIVE mg/dL   Protein, ur 30 (A) NEGATIVE mg/dL   Nitrite NEGATIVE NEGATIVE   Leukocytes, UA SMALL (A) NEGATIVE    Comment: Performed at [facility] [address], [place], [place] [address]  Urinalysis, Microscopic (reflex)     Status:  Abnormal   Collection Time: 03/02/17  6:28 PM  Result Value Ref Range   RBC / HPF 0-5 0 - 5 RBC/hpf   WBC, UA 6-30 0 - 5 WBC/hpf   Bacteria, UA MANY (A) NONE SEEN   Squamous Epithelial / LPF 0-5 (A) NONE SEEN   Urine-Other LESS THAN 10 mL OF URINE SUBMITTED     Comment: MICROSCOPIC EXAM PERFORMED ON UNCONCENTRATED URINE Performed at [facility], [place] [address], [place], [place] [address]   I-Stat beta hCG blood, ED     Status: None   Collection Time: 03/02/17  6:36 PM  Result Value Ref Range   I-stat hCG, quantitative <5.0 <5 mIU/mL   Comment 3            Comment:   GEST. AGE      CONC.  (mIU/mL)   <=1 WEEK        5 - 50     2 WEEKS       50 - 500     3 WEEKS       100 - 10,000     4 WEEKS     1,000 - 30,000        FEMALE AND NON-PREGNANT FEMALE:     LESS THAN 5 mIU/mL   I-Stat CG4 Lactic Acid, ED     Status: None   Collection Time: 03/02/17  6:38 PM  Result Value Ref Range   Lactic Acid, Venous 1.36 0.5 - 1.9 mmol/L  Potassium     Status: None   Collection Time: 03/02/17 11:23 PM  Result Value Ref Range   Potassium 4.8 3.5 - 5.1 mmol/L    Comment: Performed at [facility], [place] [address], [place], [place] [address]  Glucose, capillary     Status: Abnormal  Collection Time: 03/03/17  6:35 AM  Result Value Ref Range   Glucose-Capillary 162 (H) 65 - 99 mg/dL   Comment 1 Notify RN    Comment 2 Document in Chart   Osmolality     Status: None   Collection Time: 03/03/17  9:25 AM  Result Value Ref Range   Osmolality 291 275 - 295 mOsm/kg    Comment: Performed at [facility], [place] [address] [place], [place] [address]  Hemoglobin A1c     Status: None   Collection Time: 03/03/17 10:09 AM  Result Value Ref Range   Hgb A1c MFr Bld 5.4 4.8 - 5.6 %    Comment: (NOTE) Pre diabetes:          5.7%-6.4% Diabetes:              >6.4% Glycemic control for   <7.0% adults with diabetes    Mean Plasma Glucose 108.28 mg/dL    Comment: Performed at [facility] [address], [place], [place] [address]  Glucose, capillary     Status: Abnormal   Collection Time: 03/03/17 12:27 PM  Result Value Ref Range   Glucose-Capillary 147 (H) 65 - 99 mg/dL  Glucose, capillary     Status: Abnormal   Collection Time: 03/03/17  4:50 PM  Result Value Ref Range   Glucose-Capillary 141 (H) 65 - 99 mg/dL  Glucose, capillary     Status: Abnormal   Collection Time: 03/03/17 10:06 PM  Result Value Ref Range   Glucose-Capillary 110 (H) 65 - 99 mg/dL   No results found for this or any previous visit (from the past 240 hour(s)). Creatinine: Recent Labs    03/02/17 1812  CREATININE 1.26*   Baseline Creatinine: 1  Impression/Assessment:  51yo with urinary retention and chronic indwelling foley  Plan:  I discussed the with patient the various causes of urinary retention in women and the workup including urodynamics and cystoscopy. I stressed with the patient the need for her to keep her followup appointments with our office in order to manage her urinary retention. The patient voiced understands. Please have the patient followup with [facility] in 2-3 weeks after discharge.    Nicolette Bang 03/03/2017, 11:15 PM

## 2017-03-04 ENCOUNTER — Encounter (HOSPITAL_COMMUNITY): Payer: Self-pay | Admitting: Orthopedic Surgery

## 2017-03-04 DIAGNOSIS — Z978 Presence of other specified devices: Secondary | ICD-10-CM

## 2017-03-04 DIAGNOSIS — E119 Type 2 diabetes mellitus without complications: Secondary | ICD-10-CM | POA: Insufficient documentation

## 2017-03-04 DIAGNOSIS — Z960 Presence of urogenital implants: Secondary | ICD-10-CM

## 2017-03-04 HISTORY — DX: Presence of other specified devices: Z97.8

## 2017-03-04 LAB — BASIC METABOLIC PANEL
Anion gap: 10 (ref 5–15)
BUN: 23 mg/dL — ABNORMAL HIGH (ref 6–20)
CO2: 20 mmol/L — ABNORMAL LOW (ref 22–32)
Calcium: 8.3 mg/dL — ABNORMAL LOW (ref 8.9–10.3)
Chloride: 103 mmol/L (ref 101–111)
Creatinine, Ser: 1.43 mg/dL — ABNORMAL HIGH (ref 0.44–1.00)
GFR calc Af Amer: 49 mL/min — ABNORMAL LOW (ref >60)
GFR calc non Af Amer: 42 mL/min — ABNORMAL LOW (ref >60)
Glucose, Bld: 104 mg/dL — ABNORMAL HIGH (ref 65–99)
Potassium: 4.3 mmol/L (ref 3.5–5.1)
Sodium: 133 mmol/L — ABNORMAL LOW (ref 135–145)

## 2017-03-04 LAB — URINALYSIS, ROUTINE W REFLEX MICROSCOPIC
Bilirubin Urine: NEGATIVE
Glucose, UA: NEGATIVE mg/dL
Ketones, ur: 5 mg/dL — AB
Nitrite: NEGATIVE
Protein, ur: 100 mg/dL — AB
Specific Gravity, Urine: 1.012 (ref 1.005–1.030)
pH: 5 (ref 5.0–8.0)

## 2017-03-04 LAB — GLUCOSE, CAPILLARY
Glucose-Capillary: 94 mg/dL (ref 65–99)
Glucose-Capillary: 127 mg/dL — ABNORMAL HIGH (ref 65–99)
Glucose-Capillary: 128 mg/dL — ABNORMAL HIGH (ref 65–99)
Glucose-Capillary: 146 mg/dL — ABNORMAL HIGH (ref 65–99)

## 2017-03-04 LAB — CBC
HCT: 22.6 % — ABNORMAL LOW (ref 36.0–46.0)
Hemoglobin: 7.3 g/dL — ABNORMAL LOW (ref 12.0–15.0)
MCH: 31.6 pg (ref 26.0–34.0)
MCHC: 32.3 g/dL (ref 30.0–36.0)
MCV: 97.8 fL (ref 78.0–100.0)
Platelets: 70 10*3/uL — ABNORMAL LOW (ref 150–400)
RBC: 2.31 MIL/uL — ABNORMAL LOW (ref 3.87–5.11)
RDW: 14.4 % (ref 11.5–15.5)
WBC: 4.4 10*3/uL (ref 4.0–10.5)

## 2017-03-04 LAB — PREALBUMIN: Prealbumin: 13.2 mg/dL — ABNORMAL LOW (ref 18–38)

## 2017-03-04 LAB — URINE CULTURE

## 2017-03-04 LAB — PREPARE RBC (CROSSMATCH)

## 2017-03-04 MED ORDER — FUROSEMIDE 10 MG/ML IJ SOLN
20.0000 mg | Freq: Once | INTRAMUSCULAR | Status: AC
  Administered 2017-03-04: 20 mg via INTRAVENOUS
  Filled 2017-03-04 (×2): qty 2

## 2017-03-04 MED ORDER — SODIUM CHLORIDE 0.9 % IV SOLN
Freq: Once | INTRAVENOUS | Status: AC
  Administered 2017-03-04: 13:00:00 via INTRAVENOUS

## 2017-03-04 MED ORDER — DIPHENHYDRAMINE HCL 25 MG PO CAPS
25.0000 mg | ORAL_CAPSULE | Freq: Once | ORAL | Status: AC
  Administered 2017-03-04: 25 mg via ORAL
  Filled 2017-03-04: qty 1

## 2017-03-04 MED ORDER — ACETAMINOPHEN 325 MG PO TABS
650.0000 mg | ORAL_TABLET | Freq: Once | ORAL | Status: AC
  Administered 2017-03-04: 650 mg via ORAL
  Filled 2017-03-04: qty 2

## 2017-03-04 NOTE — Evaluation (Addendum)
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 Her most recent catheter has not been changed since November 2017. Pt sustained a fall on 2/26 out of her wheelchair. Patienthas been mostly wheelchair dependent since 2013 when her BKA was performed.Patient was found to have a minimally nondisplaced right distal femur fracture, now being treated non-operatively.     Clinical Impression  Pt admitted with above diagnosis. Pt currently with functional limitations due to the deficits listed below (see PT Problem List). At baseline Pt is mod independent with ADLs and functional mobility, mostly using w/c and using RW for functional transfers. Pt lives alone with no family to support. Upon eval, pt is mobilizing with Mod Ax2 for stand and pivot transfers into bedside chair at this time. Next PT visit will hope to progress to short distance ambulation. Pt states desire to go home but acknolweges she is not as independent right now as she was. recommending SNF for short term stay.  Pt will benefit from skilled PT to increase their independence and safety with mobility to allow discharge to the venue listed below.       Follow Up Recommendations SNF;Supervision/Assistance - 24 hour    Equipment Recommendations  None recommended by PT    Recommendations for Other Services       Precautions / Restrictions Precautions Precautions: Fall Required Braces or Orthoses: Other Brace/Splint Other Brace/Splint: R hinged brace; R stump/shrinker Restrictions Weight Bearing  Restrictions: Yes RLE Weight Bearing: Non weight bearing      Mobility  Bed Mobility Overal bed mobility: Needs Assistance Bed Mobility: Supine to Sit     Supine to sit: Min assist     General bed mobility comments: assist for RLE management towards EOB, pt using bed rails with increased time/effort to complete   Transfers Overall transfer level: Needs assistance Equipment used: Rolling walker (2 wheeled) Transfers: Sit to/from [employer] CorporationStand;Stand Pivot Transfers Sit to Stand: Mod assist;+2 physical assistance;+2 safety/equipment;From elevated surface Stand pivot transfers: Mod assist;+2 physical assistance;+2 safety/equipment       General transfer comment: assist to rise and steady at RW; pt able to take small pivotal hops with ModA+2 at RW to turn and transfer to recliner   Ambulation/Gait             General Gait Details: unable this visit.   Stairs            Wheelchair Mobility    Modified Rankin (Stroke Patients Only)       Balance Overall balance assessment: Needs assistance Sitting-balance support: Feet supported Sitting balance-Leahy Scale: Fair     Standing balance support: Bilateral upper extremity supported Standing balance-Leahy Scale: Poor Standing balance comment: reliant on UE support                              Pertinent Vitals/Pain Pain Assessment: Faces Faces Pain Scale: Hurts little more Pain Location: RLE  Pain Descriptors / Indicators: Guarding Pain Intervention(s): Limited activity within patient's tolerance;Monitored during session;Premedicated before session;Repositioned    Home Living Family/patient expects to be discharged to:: Private  residence Living Arrangements: Alone   Type of Home: House Home Access: Stairs to enter   [employer] of Steps: 1 small threshold  Home Layout: Two level;Able to live on main level with bedroom/bathroom Home Equipment: Dan Humphreys - 2 wheels;Bedside commode;Wheelchair -  manual      Prior Function Level of Independence: Independent with assistive device(s)         Comments: pt using w/c mostly, uses RW to transfer; reports w/c does not fit into bathroom and pt uses RW from doorway to enter bathroom to toilet, pt has been sponge bathing; pt has friend who assists with driving to grocery store      Hand Dominance   Dominant Hand: Right    Extremity/Trunk Assessment   Upper Extremity Assessment Upper Extremity Assessment: Defer to OT evaluation    Lower Extremity Assessment Lower Extremity Assessment: (strength grossly 4/5 on LLE. decreased light touch. RLE NT)       Communication   Communication: No difficulties  Cognition Arousal/Alertness: Awake/alert Behavior During Therapy: WFL for tasks assessed/performed Overall Cognitive Status: Within Functional Limits for tasks assessed                                        General Comments      Exercises General Exercises - Lower Extremity Ankle Circles/Pumps: 20 reps   Assessment/Plan    PT Assessment Patient needs continued PT services  PT Problem List Decreased strength;Decreased range of motion;Decreased activity tolerance;Decreased balance;Decreased mobility;Pain       PT Treatment Interventions DME instruction;Gait training;Stair training;Functional mobility training;Therapeutic activities;Therapeutic exercise    PT Goals (Current goals can be found in the Care Plan section)  Acute Rehab PT Goals Patient Stated Goal: return home  PT Goal Formulation: With patient Time For Goal Achievement: 03/11/17 Potential to Achieve Goals: Poor    Frequency Min 3X/week   Barriers to discharge Decreased caregiver support patient lives alone    Co-evaluation PT/OT/SLP Co-Evaluation/Treatment: Yes Reason for Co-Treatment: Complexity of the patient's impairments (multi-system involvement);Necessary to address cognition/behavior during functional activity;For  patient/therapist safety;To address functional/ADL transfers PT goals addressed during session: Mobility/safety with mobility;Balance;Proper use of DME;Strengthening/ROM         AM-PAC PT "6 Clicks" Daily Activity  Outcome Measure Difficulty turning over in bed (including adjusting bedclothes, sheets and blankets)?: Unable Difficulty moving from lying on back to sitting on the side of the bed? : Unable Difficulty sitting down on and standing up from a chair with arms (e.g., wheelchair, bedside commode, etc,.)?: Unable Help needed moving to and from a bed to chair (including a wheelchair)?: A Little Help needed walking in hospital room?: A Lot Help needed climbing 3-5 steps with a railing? : Total 6 Click Score: 9    End of Session Equipment Utilized During Treatment: Gait belt Activity Tolerance: Patient limited by fatigue Patient left: in chair;with call bell/phone within reach Nurse Communication: Mobility status PT Visit Diagnosis: Unsteadiness on feet (R26.81);Other abnormalities of gait and mobility (R26.89);Pain Pain - Right/Left: Right Pain - part of body: Hip    Time: 1610-9604 PT Time Calculation (min) (ACUTE ONLY): 34 min   Charges:   PT Evaluation $PT Eval Moderate Complexity: 1 Mod     PT G Codes:        Etta Grandchild, PT, DPT Acute Rehab Services Pager: [phone]    Etta Grandchild 03/04/2017, 10:25 PM

## 2017-03-04 NOTE — Evaluation (Addendum)
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 Her most recent catheter has not been changed since November 2017. Pt sustained a fall on 2/26 out of her wheelchair. Patienthas been mostly wheelchair dependent since 2013 when her BKA was performed.Patient was found to have a minimally nondisplaced right distal femur fracture, now being treated non-operatively.    Clinical Impression   This 52 y/o F presents with the above. At baseline Pt is mod independent with ADLs and functional mobility, mostly using w/c and using RW for functional transfers. Pt presented supine in bed very pleasant and willing to work with therapy. Pt completing stand pivot transfers this session with RW and ModA+2; currently requires MaxA for LB ADLs. Pt will benefit from continued acute OT services and recommend SNF level services after discharge to maximize her overall safety and independence with ADLs and mobility.     Follow Up Recommendations  SNF;Supervision/Assistance - 24 hour    Equipment Recommendations  Other (comment)(defer to next venue )           Precautions / Restrictions Precautions Precautions: Fall Required Braces or Orthoses: Other Brace/Splint Other Brace/Splint: R hinged brace; R stump/shrinker Restrictions Weight Bearing Restrictions: Yes RLE Weight Bearing: Non weight bearing      Mobility Bed Mobility Overal bed mobility: Needs Assistance Bed Mobility: Supine to Sit     Supine to sit: Min assist     General bed mobility comments: assist for RLE  management towards EOB, pt using bed rails with increased time/effort to complete   Transfers Overall transfer level: Needs assistance Equipment used: Rolling walker (2 wheeled) Transfers: Sit to/from [employer] Sit to Stand: Mod assist;+2 physical assistance;+2 safety/equipment;From elevated surface Stand pivot transfers: Mod assist;+2 physical assistance;+2 safety/equipment       General transfer comment: assist to rise and steady at RW; pt able to take small pivotal hops with ModA+2 at RW to turn and transfer to recliner     Balance Overall balance assessment: Needs assistance Sitting-balance support: Feet supported Sitting balance-Leahy Scale: Fair     Standing balance support: Bilateral upper extremity supported Standing balance-Leahy Scale: Poor Standing balance comment: reliant on UE support                            ADL either performed or assessed with clinical judgement   ADL Overall ADL's : Needs assistance/impaired Eating/Feeding: Modified independent;Sitting   Grooming: Set up;Sitting   Upper Body Bathing: Min guard;Sitting   Lower Body Bathing: Moderate assistance;+2 for physical assistance;+2 for safety/equipment;Sit to/from stand;Sitting/lateral leans   Upper Body Dressing : Set up;Sitting   Lower Body Dressing: Maximal assistance;+2 for physical assistance;+2 for safety/equipment;Sitting/lateral leans;Sit to/from stand   Toilet Transfer: Moderate assistance;+2 for physical assistance;+2 for safety/equipment;Stand-pivot;BSC;RW Toilet Transfer Details (indicate cue type and reason): simulated in transfer to recliner  Toileting- Clothing Manipulation and Hygiene: Maximal assistance;+2 for physical assistance;+2 for safety/equipment;Sit to/from stand       Functional mobility during ADLs: Moderate assistance;+2 for safety/equipment;+2 for physical assistance;Rolling walker(for stand pivot)  Pertinent Vitals/Pain Pain Assessment: Faces Faces Pain Scale: Hurts a little bit Pain Location: RLE  Pain Descriptors / Indicators: Guarding Pain Intervention(s): Monitored during session;Repositioned     Hand Dominance Right   Extremity/Trunk Assessment Upper Extremity Assessment Upper Extremity Assessment: RUE deficits/detail;LUE deficits/detail RUE Deficits / Details: strength grossly 4/5 RUE Sensation: decreased light touch LUE Deficits / Details: strength grossly 4/5 LUE Sensation: decreased light touch   Lower Extremity Assessment Lower Extremity Assessment: Defer to PT evaluation       Communication Communication Communication: No difficulties   Cognition Arousal/Alertness: Awake/alert Behavior During Therapy: WFL for tasks assessed/performed Overall Cognitive Status: Within Functional Limits for tasks assessed                                                      Home Living Family/patient expects to be discharged to:: Private residence Living Arrangements: Alone   Type of Home: House Home Access: Stairs to enter [employer] of Steps: 1 small threshold    Home Layout: Two level;Able to live on main level with bedroom/bathroom     Bathroom Shower/Tub: Chief Strategy Officer: Standard     Home Equipment: Environmental consultant - 2 wheels;Bedside commode;Wheelchair - manual          Prior Functioning/Environment Level of Independence: Independent with assistive device(s)        Comments: pt using w/c mostly, uses RW to transfer; reports w/c does not fit into bathroom and pt uses RW from doorway to enter bathroom to toilet, pt has been sponge bathing; pt has friend who assists with driving to grocery store         OT Problem List: Decreased activity tolerance;Decreased strength;Decreased range of motion;Impaired balance (sitting and/or standing);Decreased knowledge of use of DME or AE;Impaired sensation      OT  Treatment/Interventions: Self-care/ADL training;DME and/or AE instruction;Therapeutic activities;Balance training;Therapeutic exercise;Energy conservation;Patient/family education    OT Goals(Current goals can be found in the care plan section) Acute Rehab OT Goals Patient Stated Goal: return home  OT Goal Formulation: With patient Time For Goal Achievement: 03/18/17 Potential to Achieve Goals: Good  OT Frequency: Min 2X/week        Co-evaluation PT/OT/SLP Co-Evaluation/Treatment: Yes Reason for Co-Treatment: Complexity of the patient's impairments (multi-system involvement);For patient/therapist safety;To address functional/ADL transfers   OT goals addressed during session: ADL's and self-care;Proper use of Adaptive equipment and DME      AM-PAC PT "6 Clicks" Daily Activity     Outcome Measure Help from another person eating meals?: None Help from another person taking care of personal grooming?: A Little Help from another person toileting, which includes using toliet, bedpan, or urinal?: A Lot Help from another person bathing (including washing, rinsing, drying)?: A Lot Help from another person to put on and taking off regular upper body clothing?: None Help from another person to put on and taking off regular lower body clothing?: A Lot 6 Click Score: 17   End of Session Equipment Utilized During Treatment: Gait belt;Rolling walker;Other (comment)(R hinged brace) Nurse Communication: Mobility status  Activity Tolerance: Patient tolerated treatment well Patient left: in chair;with call bell/phone within reach  OT Visit Diagnosis: Other abnormalities of gait and mobility (R26.89);History of falling (Z91.81)                Time: 8119-1478 OT  Time Calculation (min): 31 min Charges:  OT General Charges $OT Visit: 1 Visit OT Evaluation $OT Eval Moderate Complexity: 1 Mod G-Codes:     Ratasha Fabre, OT PagerMarcy Siren [phone] 03/04/2017   Orlando PennerBreanna L Nilda Keathley 03/04/2017, 4:09 PM

## 2017-03-04 NOTE — Progress Notes (Signed)
Orthopedic Trauma Service Progress Note   Patient ID: [name] MRN: [id number] DOB/AGE: 1965-10-14 52 y.o.  Subjective:  Doing ok Mild pain in R leg but not too severe  Hinged brace has been fitted on leg and new stump shrinker/compressive sleeve has been applied   Pt has no help at home whatsoever   pts husband passed away unexpectedly in 06/03/2010 from an MI. He was the financial provider for the family. He was in the [employer] for 12 years and then worked for [employer] driving trucks   Her teenage children are in foster care- they went into [facility] September of 2018  She has a court date in the middle of march regarding foreclosure proceedings on her house and another court date at the end of march in regards to her children    Review of Systems  Constitutional: Negative for chills and fever.  Respiratory: Negative for shortness of breath and wheezing.   Cardiovascular: Negative for chest pain and palpitations.  Gastrointestinal: Negative for abdominal pain, nausea and vomiting.  Neurological: Negative for dizziness and headaches.    Objective:   VITALS:   Vitals:   03/03/17 1958 03/03/17 2348 03/04/17 0400 03/04/17 0634  BP: (!) 126/55 109/70 140/62   Pulse: (!) 103 88 95   Resp: 16 16 16    Temp: 99.1 F (37.3 C) 98.1 F (36.7 C) (!) 100.9 F (38.3 C) 99.8 F (37.7 C)  TempSrc: Oral Oral Oral Oral  SpO2: 98% 98% 98%   Weight:      Height:        Estimated body mass index is 29.76 kg/m as calculated from the following:   Height as of this encounter: 5\' 7"  (1.702 m).   Weight as of this encounter: 86.2 kg (190 lb).   Intake/Output      02/28 0701 - 03/01 0700 03/01 0701 - 03/02 0700   P.O. 360 480   I.V. (mL/kg) 1307.5 (15.2)    IV Piggyback 100    Total Intake(mL/kg) 1767.5 (20.5) 480 (5.6)   Urine (mL/kg/hr) 1300 (0.6)    Emesis/NG output     Total Output 1300    Net +467.5 +480          LABS  Results for orders  placed or performed during the hospital encounter of 03/02/17 (from the past 24 hour(s))  Glucose, capillary     Status: Abnormal   Collection Time: 03/03/17 12:27 PM  Result Value Ref Range   Glucose-Capillary 147 (H) 65 - 99 mg/dL  Glucose, capillary     Status: Abnormal   Collection Time: 03/03/17  4:50 PM  Result Value Ref Range   Glucose-Capillary 141 (H) 65 - 99 mg/dL  Glucose, capillary     Status: Abnormal   Collection Time: 03/03/17 10:06 PM  Result Value Ref Range   Glucose-Capillary 110 (H) 65 - 99 mg/dL  CBC     Status: Abnormal   Collection Time: 03/04/17  6:03 AM  Result Value Ref Range   WBC 4.4 4.0 - 10.5 K/uL   RBC 2.31 (L) 3.87 - 5.11 MIL/uL   Hemoglobin 7.3 (L) 12.0 - 15.0 g/dL   HCT 40.9 (L) 81.1 - 91.4 %   MCV 97.8 78.0 - 100.0 fL   MCH 31.6 26.0 - 34.0 pg   MCHC 32.3 30.0 - 36.0 g/dL   RDW 78.2 95.6 - 21.3 %   Platelets 70 (L) 150 - 400 K/uL  Basic metabolic panel  Status: Abnormal   Collection Time: 03/04/17  6:03 AM  Result Value Ref Range   Sodium 133 (L) 135 - 145 mmol/L   Potassium 4.3 3.5 - 5.1 mmol/L   Chloride 103 101 - 111 mmol/L   CO2 20 (L) 22 - 32 mmol/L   Glucose, Bld 104 (H) 65 - 99 mg/dL   BUN 23 (H) 6 - 20 mg/dL   Creatinine, Ser 4.09 (H) 0.44 - 1.00 mg/dL   Calcium 8.3 (L) 8.9 - 10.3 mg/dL   GFR calc non Af Amer 42 (L) >60 mL/min   GFR calc Af Amer 49 (L) >60 mL/min   Anion gap 10 5 - 15  Prealbumin     Status: Abnormal   Collection Time: 03/04/17  6:03 AM  Result Value Ref Range   Prealbumin 13.2 (L) 18 - 38 mg/dL  Glucose, capillary     Status: None   Collection Time: 03/04/17  6:27 AM  Result Value Ref Range   Glucose-Capillary 94 65 - 99 mg/dL   Results for MELYNDA, KRZYWICKI (MRN [id number]) as of 03/04/2017 10:57  Ref. Range 03/03/2017 10:09  Hemoglobin A1C Latest Ref Range: 4.8 - 5.6 % 5.4    PHYSICAL EXAM:   Gen: awake and alert, NAD, sitting up in bed, NAD  Lungs: CTA B  Cardiac: s1 and s2 Abd: + BS, NTND Ext:        Right Lower Extremity   Hinged brace fitting well, it is unlocked   Compressive sleeve fitting well  No acute changes in exam otherwise  Mild tenderness to R distal femur   Continues to tolerate gentle ROM of R knee   Assessment/Plan:     Active Problems:   Femur fracture (HCC)   Anti-infectives (From admission, onward)   Start     Dose/Rate Route Frequency Ordered Stop   03/03/17 1200  ampicillin-sulbactam (UNASYN) 1.5 g in sodium chloride 0.9 % 100 mL IVPB     1.5 g 200 mL/hr over 30 Minutes Intravenous Every 8 hours 03/03/17 1049     03/02/17 2045  piperacillin-tazobactam (ZOSYN) IVPB 3.375 g  Status:  Discontinued     3.375 g 100 mL/hr over 30 Minutes Intravenous  Once 03/02/17 2032 03/02/17 2111    .  POD/HD#: 2  52 y/o black female with numerous medical comorbidities with R BKA and acute R distal femur fracture after fall out of wheelchair, same foley catheter has been in place >1 year. Has not been on any medications for >1 year for DM, nor abx for catheter    -fall    - minimally displaced R distal femur fracture, R BKA             Non-op tx  NWB R leg  Hinged brace, unlocked for full knee ROM   Gentle PROM/AROM ok   Ice PRN  Continue with compressive sleeve/shrinker. Can remove as needed                There are extensive social issues complicating pts clinical picture                          Chronic medical conditions                         Death of her husband  Children in foster care   House going into or currently in foreclosure                          EtOH use?   - Pain management:             continue with current regimen    - ABL anemia/Hemodynamics            H/H trending down, not surprising given long bone fracture   Will give 1 unit PRBCs today   Monitor   CBC in am    - Medical issues              Renal failure, chonic foley cath                         new foley placed yesterday   Appreciate Urology  consult    No additional recs regarding abx    Will continue with unasyn until new urine culture returns   Previous culture from admission shows multiple organisms               Hyponatremia                          improved   Continue with NS                DM                          A1C is good    Continue with current tx                General wellness                         Will need assistance with disability paperwork                         ? Component of depression given all of her life stressors                          outpt psychology eval if we can arrange transportation?    Nutrition appears to be suboptimal- ? Lacks appropriate resources to feed self                              - DVT/PE prophylaxis:             Lovenox while inpatient    - ID:              unasyn    - Activity:             PT/OT evals   - FEN/GI prophylaxis/Foley/Lines:             CHO mod diet              NS    - Impediments to fracture healing:             DM              CKD    - Dispo:             continue with therapy   SW eval for SNF  Looking into additional  resources to provide assistance    Mearl LatinKeith W. Jermia Rigsby, PA-C [facility] [phone](P) [phone] Traci Sermon(O) [phone] (C) 03/04/2017, 10:33 AM

## 2017-03-04 NOTE — Progress Notes (Addendum)
While patient was asleep patient stopped breathing. Patient woke up several times gasping for air. Upon checking vital signs patient was found to be 78% oxygen on room air. Patient quickly increased oxygen saturations while awake with 5L oxygen applied. Patient instructed to leave oxygen on while awake and asleep. Patient verbalized understanding.   Patient now on 2L and oxygen saturation is 98%

## 2017-03-05 LAB — VITAMIN D 25 HYDROXY (VIT D DEFICIENCY, FRACTURES): Vit D, 25-Hydroxy: <4 ng/mL — ABNORMAL LOW (ref 30.0–100.0)

## 2017-03-05 LAB — URINE CULTURE: Culture: NO GROWTH

## 2017-03-05 LAB — CBC
HCT: 27.5 % — ABNORMAL LOW (ref 36.0–46.0)
Hemoglobin: 8.9 g/dL — ABNORMAL LOW (ref 12.0–15.0)
MCH: 30.8 pg (ref 26.0–34.0)
MCHC: 32.4 g/dL (ref 30.0–36.0)
MCV: 95.2 fL (ref 78.0–100.0)
Platelets: 93 10*3/uL — ABNORMAL LOW (ref 150–400)
RBC: 2.89 MIL/uL — ABNORMAL LOW (ref 3.87–5.11)
RDW: 14.9 % (ref 11.5–15.5)
WBC: 4.1 10*3/uL (ref 4.0–10.5)

## 2017-03-05 LAB — BPAM RBC
Blood Product Expiration Date: 201903272359
ISSUE DATE / TIME: 201903011343
Unit Type and Rh: 7300

## 2017-03-05 LAB — GLUCOSE, CAPILLARY
Glucose-Capillary: 139 mg/dL — ABNORMAL HIGH (ref 65–99)
Glucose-Capillary: 130 mg/dL — ABNORMAL HIGH (ref 65–99)
Glucose-Capillary: 180 mg/dL — ABNORMAL HIGH (ref 65–99)
Glucose-Capillary: 111 mg/dL — ABNORMAL HIGH (ref 65–99)

## 2017-03-05 LAB — TYPE AND SCREEN
ABO/RH(D): B POS
Antibody Screen: NEGATIVE
Unit division: 0

## 2017-03-05 MED ORDER — ORAL CARE MOUTH RINSE
15.0000 mL | Freq: Two times a day (BID) | OROMUCOSAL | Status: DC
  Administered 2017-03-05 – 2017-03-08 (×6): 15 mL via OROMUCOSAL

## 2017-03-05 NOTE — Progress Notes (Signed)
Orthopaedic Trauma Progress Note  S: Doing okay. Pain in leg still. PT/OT recommending SNF  O:  Vitals:   03/04/17 1952 03/05/17 0628  BP: (!) 98/43 (!) 126/57  Pulse:  84  Resp:    Temp:  98.3 F (36.8 C)  SpO2:  99%   RLE: hinged knee brace in place, ROM without significant pain  Labs:  CBC    Component Value Date/Time   WBC 4.1 03/05/2017 0414   RBC 2.89 (L) 03/05/2017 0414   HGB 8.9 (L) 03/05/2017 0414   HGB 10.3 (L) 09/30/2015 1118   HCT 27.5 (L) 03/05/2017 0414   HCT 31.6 (L) 09/30/2015 1118   PLT 93 (L) 03/05/2017 0414   PLT 57 Few Large platelets present (L) 09/30/2015 1118   MCV 95.2 03/05/2017 0414   MCV 84.3 09/30/2015 1118   MCH 30.8 03/05/2017 0414   MCHC 32.4 03/05/2017 0414   RDW 14.9 03/05/2017 0414   RDW 15.1 (H) 09/30/2015 1118   LYMPHSABS 0.6 (L) 03/02/2017 1812   LYMPHSABS 1.5 09/30/2015 1118   MONOABS 0.5 03/02/2017 1812   MONOABS 0.3 09/30/2015 1118   EOSABS 0.1 03/02/2017 1812   EOSABS 0.3 09/30/2015 1118   BASOSABS 0.0 03/02/2017 1812   BASOSABS 0.0 09/30/2015 111488    A/P: 52 year old female with distal femur fracture above BKA with chronic indwelling catheter  -NWB in hinged knee brace -Follow up urine cultures, continue antibiotics -PT/OT -Social services coordination -Possible SNF upon discharge, here through at least early next week.  Roby LoftsKevin P. Haddix, MD [facility] [phone] (phone)

## 2017-03-05 NOTE — Progress Notes (Signed)
Physical Therapy Treatment Patient Details Name: Cheryl HansenMichelle L Wolf MRN: [id number] DOB: 05/31/1965 Today's Date: 03/05/2017    History of Present Illness Pt is a 52 y/o female medical history including uncontrolled type 2 diabetes, hypertension, right BKA due to diabetic ulcer and infection, history of obstructive uropathy with bilateral hydronephrosis in August 2017, CKD; Patient has an indwelling Foley catheter that was placed during her hospitalization initially in August 2017. Her most recent catheter has not been changed since November 2017. Pt sustained a fall on 2/26 out of her wheelchair. Patienthas been mostly wheelchair dependent since 2013 when her BKA was performed.Patient was found to have a minimally nondisplaced right distal femur fracture, now being treated non-operatively.     PT Comments    Patient progressing well with therapy today. Supervision level for bed mobility, and able to progress to short distance ambulation in hospital room with min A x2. Patient mainly limited by fatigue in hand strength with numbness and requires rest break shortly. Desat on RA at this time to 82% with activity. Placed back on supplemental O2. Will continue to progress as tolerated, SNF recs appropriate, although patient states desire to return home if able.     Follow Up Recommendations  SNF;Supervision/Assistance - 24 hour     Equipment Recommendations  None recommended by PT    Recommendations for Other Services       Precautions / Restrictions Precautions Precautions: Fall Required Braces or Orthoses: Other Brace/Splint Other Brace/Splint: R hinged brace; R stump/shrinker Restrictions Weight Bearing Restrictions: Yes RLE Weight Bearing: Non weight bearing    Mobility  Bed Mobility Overal bed mobility: Needs Assistance Bed Mobility: Supine to Sit     Supine to sit: Supervision     General bed mobility comments: Patient able to supine to sit without physical assistance  today  Transfers Overall transfer level: Needs assistance Equipment used: Rolling walker (2 wheeled) Transfers: Sit to/from [employer] CorporationStand;Stand Pivot Transfers Sit to Stand: Min assist;+2 physical assistance Stand pivot transfers: Min assist;+2 physical assistance       General transfer comment: now min A x2 to power up, cues for hand placement. from lower surface than prior session  Ambulation/Gait Ambulation/Gait assistance: Min assist;+2 physical assistance Ambulation Distance (Feet): 20 Feet Assistive device: Rolling walker (2 wheeled) Gait Pattern/deviations: Step-to pattern Gait velocity: decreased   General Gait Details: hop to gait. patient very worried she will fall, benefits from [employer] ans reassurence. pts grip strength is weak with some neuropathy like symptoms, fatigues quickly during ambulatoin with RW, chair follow for progression.   Stairs            Wheelchair Mobility    Modified Rankin (Stroke Patients Only)       Balance Overall balance assessment: Needs assistance Sitting-balance support: Feet supported Sitting balance-Leahy Scale: Fair     Standing balance support: Bilateral upper extremity supported Standing balance-Leahy Scale: Poor Standing balance comment: reliant on UE support                             Cognition Arousal/Alertness: Awake/alert Behavior During Therapy: WFL for tasks assessed/performed Overall Cognitive Status: Within Functional Limits for tasks assessed                                        Exercises      General Comments  Pertinent Vitals/Pain Pain Assessment: Faces Faces Pain Scale: Hurts little more Pain Location: RLE  Pain Descriptors / Indicators: Guarding Pain Intervention(s): Limited activity within patient's tolerance;Monitored during session;Premedicated before session;Repositioned    Home Living                      Prior Function            PT  Goals (current goals can now be found in the care plan section) Acute Rehab PT Goals Patient Stated Goal: return home  PT Goal Formulation: With patient Time For Goal Achievement: 03/11/17 Potential to Achieve Goals: Poor Progress towards PT goals: Progressing toward goals    Frequency    Min 3X/week      PT Plan Current plan remains appropriate    Co-evaluation              AM-PAC PT "6 Clicks" Daily Activity  Outcome Measure  Difficulty turning over in bed (including adjusting bedclothes, sheets and blankets)?: A Little Difficulty moving from lying on back to sitting on the side of the bed? : A Little Difficulty sitting down on and standing up from a chair with arms (e.g., wheelchair, bedside commode, etc,.)?: A Little Help needed moving to and from a bed to chair (including a wheelchair)?: A Little Help needed walking in hospital room?: A Little Help needed climbing 3-5 steps with a railing? : A Lot 6 Click Score: 17    End of Session Equipment Utilized During Treatment: Gait belt Activity Tolerance: Patient limited by fatigue Patient left: in chair;with call bell/phone within reach Nurse Communication: Mobility status PT Visit Diagnosis: Unsteadiness on feet (R26.81);Other abnormalities of gait and mobility (R26.89);Pain Pain - Right/Left: Right Pain - part of body: Hip     Time: 1345-1421 PT Time Calculation (min) (ACUTE ONLY): 36 min  Charges:  $Gait Training: 8-22 mins $Therapeutic Activity: 8-22 mins                    G Codes:       Etta Grandchild, PT, DPT Acute Rehab Services Pager: [phone]     Etta Grandchild 03/05/2017, 2:42 PM

## 2017-03-06 ENCOUNTER — Inpatient Hospital Stay (HOSPITAL_COMMUNITY): Payer: Self-pay

## 2017-03-06 LAB — GLUCOSE, CAPILLARY
Glucose-Capillary: 93 mg/dL (ref 65–99)
Glucose-Capillary: 121 mg/dL — ABNORMAL HIGH (ref 65–99)
Glucose-Capillary: 124 mg/dL — ABNORMAL HIGH (ref 65–99)
Glucose-Capillary: 130 mg/dL — ABNORMAL HIGH (ref 65–99)

## 2017-03-06 MED ORDER — VITAMIN D (ERGOCALCIFEROL) 1.25 MG (50000 UNIT) PO CAPS
50000.0000 [IU] | ORAL_CAPSULE | ORAL | Status: DC
  Administered 2017-03-06: 50000 [IU] via ORAL
  Filled 2017-03-06: qty 1

## 2017-03-06 NOTE — NC FL2 (Deleted)
[place] MEDICAID FL2 LEVEL OF CARE SCREENING TOOL     IDENTIFICATION  Patient Name: [name] Birthdate: 12-16-1965 Sex: female Admission Date (Current Location): 03/02/2017  [facility] and [place] Number:  Producer, television/film/video and Address:  [facility] [place]. [facility], [address], [place], [place] [address]      Provider Number: [id number]  Attending Physician Name and Address:  Roby Lofts, MD  Relative Name and Phone Number:       Current Level of Care: Hospital Recommended Level of Care: Skilled Nursing Facility Prior Approval Number:    Date Approved/Denied:   PASRR Number: [id number]   Discharge Plan: SNF    Current Diagnoses: Patient Active Problem List   Diagnosis Date Noted  . Chronic indwelling Foley catheter 03/04/2017  . Type II diabetes mellitus (HCC)   . Femur fracture (HCC) 03/02/2017  . Orthostatic dizziness   . Urinary tract infection without hematuria   . Enterococcus faecalis infection   . Near syncope 11/25/2015  . Dehydration 11/25/2015  . Orthostatic hypotension 11/25/2015  . UTI (urinary tract infection) 11/25/2015  . Prolonged Q-T interval on ECG 09/25/2015  . Acute worsening of stage 3 chronic kidney disease (HCC) 09/24/2015  . Diabetes mellitus due to underlying condition with chronic kidney disease, without long-term current use of insulin (HCC)   . Fever   . Status post below knee amputation of right lower extremity (HCC)   . Diabetic peripheral neuropathy (HCC)   . Neuropathic pain   . Benign essential HTN   . Folliculitis   . Slow transit constipation   . Anemia of chronic disease   . Bilateral hydronephrosis   . Urinary retention   . CKD (chronic kidney disease)   . Uncontrolled type 2 diabetes mellitus with diabetic nephropathy, without long-term current use of insulin (HCC)   . Vasculopathy   . Debility 09/04/2015  . AKI (acute kidney injury) (HCC)   . DM type 2 with diabetic peripheral neuropathy  (HCC)   . S/P BKA (below knee amputation) unilateral (HCC)   . Medical non-compliance   . Benign skin lesion of multiple sites   . Tachypnea   . Acute blood loss anemia   . Neurogenic bladder   . Occult blood positive stool   . Rectovaginal fistula   . Controlled diabetes mellitus type 2 with complications (HCC)   . Acute on chronic renal failure (HCC)   . Hydronephrosis determined by ultrasound   . Papular rash   . Uncontrolled type 2 diabetes mellitus with complication (HCC)   . Paresthesia   . Thrombocytopenia (HCC) 08/23/2015  . Pressure ulcer 08/23/2015  . Severe anemia 08/23/2015  . Lower urinary tract infectious disease 08/23/2015  . Absolute anemia 08/23/2015  . Peripheral vascular disease, unspecified (HCC) 04/27/2012  . Unilateral complete BKA (HCC) 07/09/2011  . Gas gangrene (HCC) 07/02/2011  . Sepsis(995.91) 07/02/2011  . Acute renal failure (HCC) 07/02/2011  . Diabetes mellitus (HCC) 11/22/2010  . Diabetic foot ulcer associated with type 2 diabetes mellitus (HCC)   . Hypertension   . Arthritis   . Neuropathy (HCC)   . Intracerebral hemorrhage (HCC)     Orientation RESPIRATION BLADDER Height & Weight     Self, Time, Situation, Place  O2(Nasal Cannula; 2L) Continent Weight: 190 lb (86.2 kg) Height:  5\' 7"  (170.2 cm)  BEHAVIORAL SYMPTOMS/MOOD NEUROLOGICAL BOWEL NUTRITION STATUS      Continent Diet(Carb modified, thin liquids)  AMBULATORY STATUS COMMUNICATION OF NEEDS Skin  Limited Assist Verbally                         Personal Care Assistance Level of Assistance  Bathing, Feeding, Dressing Bathing Assistance: Limited assistance Feeding assistance: Independent Dressing Assistance: Limited assistance     Functional Limitations Info  Sight, Hearing, Speech Sight Info: Adequate Hearing Info: Adequate Speech Info: Adequate    SPECIAL CARE FACTORS FREQUENCY  PT (By licensed PT), OT (By licensed OT)     PT Frequency: 3x OT Frequency: 3x             Contractures Contractures Info: Not present    Additional Factors Info  Code Status, Allergies Code Status Info: full code Allergies Info: no known allergies           Current Medications (03/06/2017):  This is the current hospital active medication list Current Facility-Administered Medications  Medication Dose Route Frequency Provider Last Rate Last Dose  . 0.9 % NaCl with KCl 20 mEq/ L  infusion   Intravenous Continuous Montez Moritaaul, Keith, PA-C   Stopped at 03/04/17 1502  . ampicillin-sulbactam (UNASYN) 1.5 g in sodium chloride 0.9 % 100 mL IVPB  1.5 g Intravenous Q8H Armandina StammerBatchelder, Nathan J, RPH   Stopped at 03/06/17 1229  . enoxaparin (LOVENOX) injection 40 mg  40 mg Subcutaneous Q24H Armandina StammerBatchelder, Nathan J, RPH   40 mg at 03/05/17 1651  . HYDROcodone-acetaminophen (NORCO/VICODIN) 5-325 MG per tablet 1-2 tablet  1-2 tablet Oral Q4H PRN Rolland PorterJames, Mark, MD   2 tablet at 03/06/17 1034  . insulin aspart (novoLOG) injection 0-15 Units  0-15 Units Subcutaneous TID WC Montez MoritaPaul, Keith, PA-C   2 Units at 03/06/17 1151  . MEDLINE mouth rinse  15 mL Mouth Rinse BID Haddix, Gillie MannersKevin P, MD   15 mL at 03/06/17 1034  . methocarbamol (ROBAXIN) tablet 500 mg  500 mg Oral Q6H PRN Montez MoritaPaul, Keith, PA-C      . oxyCODONE (Oxy IR/ROXICODONE) immediate release tablet 5 mg  5 mg Oral Q3H PRN Montez MoritaPaul, Keith, PA-C   5 mg at 03/03/17 1655  . Vitamin D (Ergocalciferol) (DRISDOL) capsule 50,000 Units  50,000 Units Oral Q7 days Julien GirtShepperson, Kirstin, PA-C   50,000 Units at 03/06/17 1034     Discharge Medications: Please see discharge summary for a list of discharge medications.  Relevant Imaging Results:  Relevant Lab Results:   Additional Information ssn: [id number]  Maree KrabbeBridget A Haruko Mersch, LCSW

## 2017-03-06 NOTE — Progress Notes (Signed)
Subjective: Patient feeling better today.  Still intermittent pain.  Concerned about O2 sats dropping while she sleeps.  Objective: Vital signs in last 24 hours: Temp:  [98.6 F (37 C)-99.5 F (37.5 C)] 98.6 F (37 C) (03/03 0639) Pulse Rate:  [90-97] 90 (03/03 0639) Resp:  [16-18] 16 (03/03 0639) BP: (108-167)/(63-80) 163/75 (03/03 0639) SpO2:  [99 %-100 %] 100 % (03/03 0639)  Intake/Output from previous day: 03/02 0701 - 03/03 0700 In: 240 [P.O.:240] Out: 675 [Urine:675] Intake/Output this shift: No intake/output data recorded.  Recent Labs    03/04/17 0603 03/05/17 0414  HGB 7.3* 8.9*   Recent Labs    03/04/17 0603 03/05/17 0414  WBC 4.4 4.1  RBC 2.31* 2.89*  HCT 22.6* 27.5*  PLT 70* 93*   Recent Labs    03/04/17 0603  NA 133*  K 4.3  CL 103  CO2 20*  BUN 23*  CREATININE 1.43*  GLUCOSE 104*  CALCIUM 8.3*   No results for input(s): LABPT, INR in the last 72 hours.  Incision: dressing C/D/I and no drainage  Assessment Principal Problem:   Femur fracture (HCC) Active Problems:   Hypertension   Neuropathy (HCC)   Diabetes mellitus (HCC)   Peripheral vascular disease, unspecified (HCC)   S/P BKA (below knee amputation) unilateral (HCC)   Acute blood loss anemia   Urinary retention   CKD (chronic kidney disease)   Uncontrolled type 2 diabetes mellitus with diabetic nephropathy, without long-term current use of insulin (HCC)   Diabetic peripheral neuropathy (HCC)   Chronic indwelling Foley catheter   Plan: Will order chest xray, continuous O2 sat monitoring and incentive spirometry to combat low grade fever and wean off O2.  Can order an automatically titrated CPAP to go with her to skilled nursing facility usually as long as there is a sleep study set up for a future day if continuous O2 sat monitoring suggests sleep apnea.  Will defer to Dr Jena GaussHaddix.  Case management can assist if necessary   Brantley Wiley J 03/06/2017, 9:20 AM

## 2017-03-06 NOTE — Progress Notes (Signed)
Pharmacy Antibiotic Note  Earvin HansenMichelle L Largo is a 52 y.o. female admitted on 03/02/2017 with UTI.  Pharmacy has been consulted for Unasyn dosing.  Has had enterococcus UTI in past.  Plan: Continue Unasyn 1.5g IV Q8h Monitor clinical picture, renal function F/U  LOT   Consider treating for 5 days and stopping on 3/4?  Height: 5\' 7"  (170.2 cm) Weight: 190 lb (86.2 kg) IBW/kg (Calculated) : 61.6  Temp (24hrs), Avg:99.2 F (37.3 C), Min:98.6 F (37 C), Max:99.5 F (37.5 C)  Recent Labs  Lab 03/02/17 1812 03/02/17 1838 03/04/17 0603 03/05/17 0414  WBC 5.1  --  4.4 4.1  CREATININE 1.26*  --  1.43*  --   LATICACIDVEN  --  1.36  --   --     Estimated Creatinine Clearance: 53.1 mL/min (A) (by C-G formula based on SCr of 1.43 mg/dL (H)).    No Known Allergies  Thank you for allowing pharmacy to be a part of this patient's care.  Armandina StammerBATCHELDER,Dameshia Seybold J 03/06/2017 9:50 AM

## 2017-03-06 NOTE — NC FL2 (Signed)
[place] MEDICAID FL2 LEVEL OF CARE SCREENING TOOL     IDENTIFICATION  Patient Name: [name] Birthdate: 11/22/1965 Sex: female Admission Date (Current Location): 03/02/2017  County and Medicaid Number:  [place]   Facility and Address:  [facility] [place], [address], [place], [place] [address]      Provider Number: [id number]  Attending Physician Name and Address:  Haddix, Kevin P, MD  Relative Name and Phone Number:       Current Level of Care: Hospital Recommended Level of Care: Skilled Nursing Facility Prior Approval Number:    Date Approved/Denied:   PASRR Number: [id number]   Discharge Plan: SNF    Current Diagnoses: Patient Active Problem List   Diagnosis Date Noted  . Chronic indwelling Foley catheter 03/04/2017  . Type II diabetes mellitus (HCC)   . Femur fracture (HCC) 03/02/2017  . Orthostatic dizziness   . Urinary tract infection without hematuria   . Enterococcus faecalis infection   . Near syncope 11/25/2015  . Dehydration 11/25/2015  . Orthostatic hypotension 11/25/2015  . UTI (urinary tract infection) 11/25/2015  . Prolonged Q-T interval on ECG 09/25/2015  . Acute worsening of stage 3 chronic kidney disease (HCC) 09/24/2015  . Diabetes mellitus due to underlying condition with chronic kidney disease, without long-term current use of insulin (HCC)   . Fever   . Status post below knee amputation of right lower extremity (HCC)   . Diabetic peripheral neuropathy (HCC)   . Neuropathic pain   . Benign essential HTN   . Folliculitis   . Slow transit constipation   . Anemia of chronic disease   . Bilateral hydronephrosis   . Urinary retention   . CKD (chronic kidney disease)   . Uncontrolled type 2 diabetes mellitus with diabetic nephropathy, without long-term current use of insulin (HCC)   . Vasculopathy   . Debility 09/04/2015  . AKI (acute kidney injury) (HCC)   . DM type 2 with diabetic peripheral neuropathy  (HCC)   . S/P BKA (below knee amputation) unilateral (HCC)   . Medical non-compliance   . Benign skin lesion of multiple sites   . Tachypnea   . Acute blood loss anemia   . Neurogenic bladder   . Occult blood positive stool   . Rectovaginal fistula   . Controlled diabetes mellitus type 2 with complications (HCC)   . Acute on chronic renal failure (HCC)   . Hydronephrosis determined by ultrasound   . Papular rash   . Uncontrolled type 2 diabetes mellitus with complication (HCC)   . Paresthesia   . Thrombocytopenia (HCC) 08/23/2015  . Pressure ulcer 08/23/2015  . Severe anemia 08/23/2015  . Lower urinary tract infectious disease 08/23/2015  . Absolute anemia 08/23/2015  . Peripheral vascular disease, unspecified (HCC) 04/27/2012  . Unilateral complete BKA (HCC) 07/09/2011  . Gas gangrene (HCC) 07/02/2011  . Sepsis(995.91) 07/02/2011  . Acute renal failure (HCC) 07/02/2011  . Diabetes mellitus (HCC) 11/22/2010  . Diabetic foot ulcer associated with type 2 diabetes mellitus (HCC)   . Hypertension   . Arthritis   . Neuropathy (HCC)   . Intracerebral hemorrhage (HCC)     Orientation RESPIRATION BLADDER Height & Weight     Self, Time, Situation, Place  O2(Nasal Cannula; 2L) Continent Weight: 190 lb (86.2 kg) Height:  5' 7" (170.2 cm)  BEHAVIORAL SYMPTOMS/MOOD NEUROLOGICAL BOWEL NUTRITION STATUS      Continent Diet(Carb modified, thin liquids)  AMBULATORY STATUS COMMUNICATION OF NEEDS Skin     Limited Assist Verbally                         Personal Care Assistance Level of Assistance  Bathing, Feeding, Dressing Bathing Assistance: Limited assistance Feeding assistance: Independent Dressing Assistance: Limited assistance     Functional Limitations Info  Sight, Hearing, Speech Sight Info: Adequate Hearing Info: Adequate Speech Info: Adequate    SPECIAL CARE FACTORS FREQUENCY  PT (By licensed PT), OT (By licensed OT)     PT Frequency: 3x OT Frequency: 3x             Contractures Contractures Info: Not present    Additional Factors Info  Code Status, Allergies Code Status Info: full code Allergies Info: no known allergies           Current Medications (03/06/2017):  This is the current hospital active medication list Current Facility-Administered Medications  Medication Dose Route Frequency Provider Last Rate Last Dose  . 0.9 % NaCl with KCl 20 mEq/ L  infusion   Intravenous Continuous Paul, Keith, PA-C   Stopped at 03/04/17 1502  . ampicillin-sulbactam (UNASYN) 1.5 g in sodium chloride 0.9 % 100 mL IVPB  1.5 g Intravenous Q8H Batchelder, Nathan J, RPH   Stopped at 03/06/17 1229  . enoxaparin (LOVENOX) injection 40 mg  40 mg Subcutaneous Q24H Batchelder, Nathan J, RPH   40 mg at 03/05/17 1651  . HYDROcodone-acetaminophen (NORCO/VICODIN) 5-325 MG per tablet 1-2 tablet  1-2 tablet Oral Q4H PRN James, Mark, MD   2 tablet at 03/06/17 1034  . insulin aspart (novoLOG) injection 0-15 Units  0-15 Units Subcutaneous TID WC Paul, Keith, PA-C   2 Units at 03/06/17 1151  . MEDLINE mouth rinse  15 mL Mouth Rinse BID Haddix, Kevin P, MD   15 mL at 03/06/17 1034  . methocarbamol (ROBAXIN) tablet 500 mg  500 mg Oral Q6H PRN Paul, Keith, PA-C      . oxyCODONE (Oxy IR/ROXICODONE) immediate release tablet 5 mg  5 mg Oral Q3H PRN Paul, Keith, PA-C   5 mg at 03/03/17 1655  . Vitamin D (Ergocalciferol) (DRISDOL) capsule 50,000 Units  50,000 Units Oral Q7 days Shepperson, Kirstin, PA-C   50,000 Units at 03/06/17 1034     Discharge Medications: Please see discharge summary for a list of discharge medications.  Relevant Imaging Results:  Relevant Lab Results:   Additional Information ssn: [id number]  Kristoffer Bala A Braileigh Landenberger, LCSW    

## 2017-03-06 NOTE — Progress Notes (Signed)
Placed patient on continuous pulse ox and removed nasal canula from nose. In less 2 minutes, at rest, the patients SpO2 was 84% on RA. When 2L per  was placed, she immediately came up to 96%. Will continue to monitor.

## 2017-03-07 ENCOUNTER — Inpatient Hospital Stay (HOSPITAL_COMMUNITY): Payer: Self-pay

## 2017-03-07 DIAGNOSIS — E041 Nontoxic single thyroid nodule: Secondary | ICD-10-CM | POA: Diagnosis present

## 2017-03-07 DIAGNOSIS — R0902 Hypoxemia: Secondary | ICD-10-CM

## 2017-03-07 LAB — COMPREHENSIVE METABOLIC PANEL
ALT: 25 U/L (ref 14–54)
AST: 30 U/L (ref 15–41)
Albumin: 3.2 g/dL — ABNORMAL LOW (ref 3.5–5.0)
Alkaline Phosphatase: 72 U/L (ref 38–126)
Anion gap: 11 (ref 5–15)
BUN: 28 mg/dL — ABNORMAL HIGH (ref 6–20)
CO2: 20 mmol/L — ABNORMAL LOW (ref 22–32)
Calcium: 8.4 mg/dL — ABNORMAL LOW (ref 8.9–10.3)
Chloride: 104 mmol/L (ref 101–111)
Creatinine, Ser: 1.6 mg/dL — ABNORMAL HIGH (ref 0.44–1.00)
GFR calc Af Amer: 42 mL/min — ABNORMAL LOW (ref >60)
GFR calc non Af Amer: 37 mL/min — ABNORMAL LOW (ref >60)
Glucose, Bld: 155 mg/dL — ABNORMAL HIGH (ref 65–99)
Potassium: 4.1 mmol/L (ref 3.5–5.1)
Sodium: 135 mmol/L (ref 135–145)
Total Bilirubin: 0.6 mg/dL (ref 0.3–1.2)
Total Protein: 7.3 g/dL (ref 6.5–8.1)

## 2017-03-07 LAB — CBC
HCT: 28.9 % — ABNORMAL LOW (ref 36.0–46.0)
Hemoglobin: 9.2 g/dL — ABNORMAL LOW (ref 12.0–15.0)
MCH: 30.3 pg (ref 26.0–34.0)
MCHC: 31.8 g/dL (ref 30.0–36.0)
MCV: 95.1 fL (ref 78.0–100.0)
Platelets: 140 10*3/uL — ABNORMAL LOW (ref 150–400)
RBC: 3.04 MIL/uL — ABNORMAL LOW (ref 3.87–5.11)
RDW: 14.5 % (ref 11.5–15.5)
WBC: 4.9 10*3/uL (ref 4.0–10.5)

## 2017-03-07 LAB — GLUCOSE, CAPILLARY
Glucose-Capillary: 137 mg/dL — ABNORMAL HIGH (ref 65–99)
Glucose-Capillary: 101 mg/dL — ABNORMAL HIGH (ref 65–99)
Glucose-Capillary: 131 mg/dL — ABNORMAL HIGH (ref 65–99)
Glucose-Capillary: 158 mg/dL — ABNORMAL HIGH (ref 65–99)

## 2017-03-07 LAB — TSH: TSH: 2.012 u[IU]/mL (ref 0.350–4.500)

## 2017-03-07 MED ORDER — HYDROCODONE-ACETAMINOPHEN 5-325 MG PO TABS
1.0000 | ORAL_TABLET | Freq: Four times a day (QID) | ORAL | Status: DC | PRN
  Administered 2017-03-07 – 2017-03-08 (×3): 1 via ORAL
  Filled 2017-03-07 (×3): qty 1

## 2017-03-07 MED ORDER — IOPAMIDOL (ISOVUE-370) INJECTION 76%
INTRAVENOUS | Status: AC
  Administered 2017-03-07: 54 mL
  Filled 2017-03-07: qty 100

## 2017-03-07 MED ORDER — VITAMIN D 1000 UNITS PO TABS
2000.0000 [IU] | ORAL_TABLET | Freq: Two times a day (BID) | ORAL | Status: DC
  Administered 2017-03-07 – 2017-03-08 (×2): 2000 [IU] via ORAL
  Filled 2017-03-07 (×2): qty 2

## 2017-03-07 MED ORDER — OXYCODONE HCL 5 MG PO TABS: 5.0000 mg | ORAL_TABLET | ORAL | Status: DC | PRN

## 2017-03-07 NOTE — Social Work (Signed)
CSW discussed [facility] with [place] [facility]. She is reviewing and will let CSW know if they will accept with LOG.  CSW will continue to follow.  Keene BreathPatricia Man Bonneau, LCSW Clinical Social Worker [phone]

## 2017-03-07 NOTE — Progress Notes (Signed)
Patient refused CPAP Hs tonight. Patient states she doesn't wear a CPAP at night.

## 2017-03-07 NOTE — Progress Notes (Addendum)
Orthopedic Trauma Service Progress Note   Patient ID: [name] MRN: [id number] DOB/AGE: Dec 08, 1965 51 y.o.  Subjective:  Pt doing ok Pain tolerable  Hypoxic events noted when sleeping   CT angio negative for PE Incidental thyroid nodule noted   Pt denies any CP, No SOB No palpitations   States she does note that she sleeps with her mouth open   Review of Systems  Constitutional: Negative for chills and fever.  Respiratory: Negative for shortness of breath.   Cardiovascular: Negative for chest pain and palpitations.  Gastrointestinal: Negative for abdominal pain, nausea and vomiting.    Objective:   VITALS:   Vitals:   03/06/17 0639 03/06/17 1420 03/06/17 2131 03/07/17 0446  BP: (!) 163/75 119/64 (!) 148/68 (!) 153/81  Pulse: 90 80 87 91  Resp: 16 16 16    Temp: 98.6 F (37 C) 98.8 F (37.1 C) 99.1 F (37.3 C) 98.9 F (37.2 C)  TempSrc: Oral Oral Oral Oral  SpO2: 100% 100% 99% 98%  Weight:      Height:        Estimated body mass index is 29.76 kg/m as calculated from the following:   Height as of this encounter: 5\' 7"  (1.702 m).   Weight as of this encounter: 86.2 kg (190 lb).   Intake/Output      03/03 0701 - 03/04 0700 03/04 0701 - 03/05 0700   P.O. 480 360   IV Piggyback 600    Total Intake(mL/kg) 1080 (12.5) 360 (4.2)   Urine (mL/kg/hr) 3950 (1.9)    Stool 1 1   Total Output 3951 1   Net -2871 +359          LABS  Results for orders placed or performed during the hospital encounter of 03/02/17 (from the past 24 hour(s))  Glucose, capillary     Status: Abnormal   Collection Time: 03/06/17  4:34 PM  Result Value Ref Range   Glucose-Capillary 124 (H) 65 - 99 mg/dL  Glucose, capillary     Status: Abnormal   Collection Time: 03/06/17  9:34 PM  Result Value Ref Range   Glucose-Capillary 130 (H) 65 - 99 mg/dL  Glucose, capillary     Status: Abnormal   Collection Time: 03/07/17  7:58 AM  Result Value Ref Range    Glucose-Capillary 137 (H) 65 - 99 mg/dL  Glucose, capillary     Status: Abnormal   Collection Time: 03/07/17 12:19 PM  Result Value Ref Range   Glucose-Capillary 101 (H) 65 - 99 mg/dL     PHYSICAL EXAM:   Gen: awake and alert, NAD, getting ready to eat lunch  Lungs: CTA B, no wheezes or rhonchi noted Cardiac: RRR, s1 and s2 Abd: +  BS, NTND Ext:        Right Lower Extremity              Hinged brace fitting well, it is unlocked              Compressive sleeve fitting well             No acute changes in exam otherwise             Mild tenderness to R distal femur              Continues to tolerate gentle ROM of R knee    Assessment/Plan:     Principal Problem:   Femur fracture (HCC) Active Problems:   Hypertension   Neuropathy (  HCC)   Diabetes mellitus (HCC)   Peripheral vascular disease, unspecified (HCC)   S/P BKA (below knee amputation) unilateral (HCC)   Acute blood loss anemia   Urinary retention   CKD (chronic kidney disease)   Uncontrolled type 2 diabetes mellitus with diabetic nephropathy, without long-term current use of insulin (HCC)   Diabetic peripheral neuropathy (HCC)   Chronic indwelling Foley catheter   Anti-infectives (From admission, onward)   Start     Dose/Rate Route Frequency Ordered Stop   03/03/17 1200  ampicillin-sulbactam (UNASYN) 1.5 g in sodium chloride 0.9 % 100 mL IVPB     1.5 g 200 mL/hr over 30 Minutes Intravenous Every 8 hours 03/03/17 1049     03/02/17 2045  piperacillin-tazobactam (ZOSYN) IVPB 3.375 g  Status:  Discontinued     3.375 g 100 mL/hr over 30 Minutes Intravenous  Once 03/02/17 2032 03/02/17 2111    .  POD/HD#: 685  52 y/o black female with numerous medical comorbidities with R BKA and acute R distal femur fracture after fall out of wheelchair, same foley catheter has been in place >1 year. Has not been on any medications for >1 year for DM, nor abx for catheter    -fall    - minimally displaced R distal femur  fracture, R BKA             Non-op tx             NWB R leg             Hinged brace, unlocked for full knee ROM                         Gentle PROM/AROM ok              Ice PRN             Continue with compressive sleeve/shrinker. Can remove as needed                 There are extensive social issues complicating pts clinical picture                          Chronic medical conditions                         Death of her husband                         Children in foster care                         House going into or currently in foreclosure                          EtOH use?   - Pain management:             continue with current regimen    - ABL anemia/Hemodynamics            cbc in am  Fairly stable, BPs trending on higher side but not too severe    - Medical issues              Renal failure, chonic foley cath  new foley placed during this admission                          Appreciate Urology consult                          No additional recs regarding abx                           new culture has not grown out any organisms    Will dc unasyn               Hyponatremia                          improved                         Continue with NS                DM                          A1C is good                          Continue with current tx    Hypoxia    CTA negative for PE   History sounds like undiagnosed OSA   Will obtain medical consult for additional input     Will likely need outpt sleep study      Will defer to IM as to whether or not to order CPAP at this time    Thyroid nodule   Incidental thyroid nodule on CTA   Will get u/s    Appears to be chronic in nature. Thyroid U/S ordered back in 2017 but was never done. Will do while inpatient                General wellness                         Will need assistance with disability paperwork                         ? Component of depression given all of her life stressors                           outpt psychology eval if we can arrange transportation?                           Nutrition appears to be suboptimal- ? Lacks appropriate resources to feed self    Dietician consult      - DVT/PE prophylaxis:             Lovenox while inpatient    - ID:              unasyn dc'd   - Activity:             PT/OT   - FEN/GI prophylaxis/Foley/Lines:             CHO mod diet              NS    - Impediments to  fracture healing:             DM              CKD    - Dispo:             continue with therapy              SW eval for SNF             Looking into additional resources to provide assistance    Mearl Latin, PA-C [facility] [phone] Traci Sermon (C) 03/07/2017, 12:37 PM

## 2017-03-07 NOTE — Clinical Social Work Note (Signed)
Clinical Social Work Assessment  Patient Details  Name: [name] MRN: [id number] Date of Birth: 05/07/1965  Date of referral:  03/07/17               Reason for consult:  Facility Placement                Permission sought to share information with:  Facility Art therapist granted to share information::  Yes, Release of Information Signed  Name::        Agency::  SNF  Relationship::     Contact Information:     Housing/Transportation Living arrangements for the past 2 months:  Apartment Source of Information:  Patient Patient Interpreter Needed:  None Criminal Activity/Legal Involvement Pertinent to Current Situation/Hospitalization:  No - Comment as needed Significant Relationships:  Other Family Members Lives with:  Self Do you feel safe going back to the place where you live?  No Need for family participation in patient care:  No (Coment)  Care giving concerns:  CSW met with patient at bedside. CSW explained CSW role, SNF options/placement.  Pt resides alone at home and was independent prior to new impairment. Pt with new impairment and agreeble to SNF in local area. Pt fell out of wheelchair while in the community. Pt relies on wheelchair and uses walker occasionally. Pt has experience with rehab as she completed short term rehap in [facility] when she was newly diagnosed with BKA. Pt desires to return to IP rehab, however pt was not recommended by therapy evaluation.  Pt has no insurance and will need LOG bed for short term rehab as she has no insurance. Pt has not applied for insurance. CSW will f/u with financial counselors. CSW confirmed auth for LOG with [facility].  Social Worker assessment / plan:  CSW f/u for disposition.  Employment status:  Disabled (Comment on whether or not currently receiving Disability)(Not receiving disability) Insurance information:  Self Pay (Medicaid Pending) PT Recommendations:  [place] /  Referral to community resources:  [place]  Patient/Family's Response to care:  Patient appreciative of CSW meeting to discuss SNF placement and options. No issues with care.  Patient/Family's Understanding of and Emotional Response to Diagnosis, Current Treatment, and Prognosis:  Patient understands that she will need SNF placement. Pt was hoping she would get in to IP rehab. CSW advised that therapy recommending SNF placement at this time. Pt agreeable and indicated that she would like to stay in [place] area as she has personal business in the area. CSW agreed to same and discuss the barriers. Pt acknowledge. CSW will f/u for disposition. No issues or concerns at this time.  Emotional Assessment Appearance:  Appears stated age Attitude/Demeanor/Rapport:  (Cooperative) Affect (typically observed):  Accepting, Appropriate Orientation:  Oriented to Situation, Oriented to  Time, Oriented to Place, Oriented to Self Alcohol / Substance use:  Not Applicable Psych involvement (Current and /or in the community):  No (Comment)  Discharge Needs  Concerns to be addressed:  Discharge Planning Concerns Readmission within the last 30 days:  No Current discharge risk:  Dependent with Mobility, Physical Impairment Barriers to Discharge:  No Barriers Identified   Normajean Baxter, LCSW 03/07/2017, 12:44 PM

## 2017-03-07 NOTE — Consult Note (Signed)
[facility] Medical Consultation  [name]:[id number] DOB: Feb 18, 1965 DOA: 03/02/2017 PCP: [name], MD (Inactive)   Requesting physician: Montez Morita PA Date of consultation: March 07 2017 Reason for consultation: hypoxia/sleep apnea  Impression/Recommendations Principal Problem:   Hypoxia Active Problems:   Hypertension   Neuropathy (HCC)   Peripheral vascular disease, unspecified (HCC)   Controlled diabetes mellitus type 2 with complications (HCC)   S/P BKA (below knee amputation) unilateral (HCC)   Anemia of chronic disease   Urinary retention   CKD (chronic kidney disease)   Benign essential HTN   Diabetic peripheral neuropathy (HCC)   Femur fracture (HCC)   Chronic indwelling Foley catheter   Thyroid nodule  #1. Hypoxia during the night while sleeping. Chart review indicates oxygen saturation level drops 84%. Suspect she has some undiagnosed sleep apnea in the setting of neck pain medicine. Chest x-ray without acute abnormality, to the chest negative for PE. Oxygen saturation level 98% when awake on 2 L. She is afebrile hemodynamically stable no leukocytosis -Incentive spirometry -Minimize sedating medications -Respiratory therapy consult for CPAP at night -Will need official sleep study outpatient -Monitor oxygen saturation level -Into the oxygen supplement patient as indicated  #2. Thyroid nodule. Incidental finding on recent CT. -Follow-up ultrasound -Obtain a TSH -We'll need outpatient follow-up  #3. Uncontrolled diabetes. In the past for diabetes was difficult to control. Recent hemoglobin A1c 6. Blood sugars well controlled during this hospitalization -Car modified diet  #4. Hypertension. Fair control. Home meds include no anti-hypertesive med -Monitor -consider ace inhibitor (has hx of ckd as well)  5. Chronic kidney disease stage III . Creatinine 1.4 on admission. Chart review indicates previous baseline 1.3.  6. Chronic indwelling  catheter secondary to a history of obstructive uropathy with bilateral hydronephrosis. She's had this since August 2017. Catheter was changed upon admission.  7. Anemia. Hemoglobin stable. Related to chronic disease   [facility] will followup again tomorrow. Please contact [facility] if can be of assistance in the meanwhile. Thank you for this consultation.  Chief Complaint: hypoxia  HPI: [name] 52 year old female history of uncontrolled diabetes, chronic kidney disease, obstructive uropathy with bilateral hydronephrosis and chronic Foley catheter, right below the knee amputation due to diabetic ulcer and infection, hypertension, anemia, admitted 4 days ago for fracture right distal femur in the setting of below the knee amputation requiring non-operative therapy has experienced hypoxic episodes during the night with oxygen saturation level dropping to 84%. Right hospitalists are asked to admit.  Patient reports a long history of snoring. She does not have a primary care provider and has not had any outpatient follow-up for chronic issues. She denies any recent respiratory issues i.e. nasal congestion sinus congestion cough. She denies any fever chills chest pain palpitations shortness of breath. She denies headache dizziness syncope or near-syncope. She denies any abdominal pain nausea vomiting diarrhea constipation melena bright red blood per rectum. Never been evaluated for sleep apnea according to her. She does report decreased sensation in her left foot and left leg with some chronic "burning/stinging".   Review of Systems:  10 point review of systems complete and all systems are negative except as indicated in the history of present illness  Past Medical History:  Diagnosis Date  . Anemia   . Arthritis    "spine, hands" (11/25/2015)  . Back pain    "all my back; probably 3 times/week" (11/25/2015)  . Chronic indwelling Foley catheter 03/04/2017  . Diabetic foot ulcer associated with type 2  diabetes mellitus (HCC)   . Hypertension   . Intracerebral hemorrhage (HCC) As a teenager    States she had burst blood vessel as teenager, now with resultant minor visual field and hearing deficits  . Neuropathy   . Stroke [facility](HCC) ~ 1982   "my feeling on my RLE, right lower eye vision,  & hearing out of my right ear not the same since" (11/25/2015)  . Type II diabetes mellitus (HCC)    Past Surgical History:  Procedure Laterality Date  . AMPUTATION  11/24/2010   Procedure: AMPUTATION FOOT;  Surgeon: Toni ArthursJohn Hewitt, MD;  [facility];  Service: Orthopedics;  Laterality: Right;  Right  FOOT TRANS METATARSAL AMPUTATION  . AMPUTATION  07/02/2011   Procedure: AMPUTATION BELOW KNEE;  Surgeon: Toni ArthursJohn Hewitt, MD;  Location: [facility] OR;  Service: Orthopedics;  Laterality: Right;  . APPLICATION OF WOUND VAC  07/02/2011   Procedure: APPLICATION OF WOUND VAC;  Surgeon: Toni ArthursJohn Hewitt, MD;  Location: [facility] OR;  Service: Orthopedics;  Laterality: Right;  . BRAIN SURGERY  1980's   [place]  . CESAREAN SECTION  2004; 2006  . COLONOSCOPY N/A 09/17/2015   Procedure: COLONOSCOPY;  Surgeon: Charlott RakesVincent Schooler, MD;  [facility];  Service: Endoscopy;  Laterality: N/A;  . ESOPHAGOGASTRODUODENOSCOPY N/A 09/17/2015   Procedure: ESOPHAGOGASTRODUODENOSCOPY (EGD);  Surgeon: Charlott RakesVincent Schooler, MD;  [facility];  Service: Endoscopy;  Laterality: N/A;  . I&D EXTREMITY  11/24/2010   Procedure: IRRIGATION AND DEBRIDEMENT EXTREMITY;  Surgeon: Toni ArthursJohn Hewitt, MD;  Location: [facility] OR;  Service: Orthopedics;  Laterality: Left;  Debriedment of left plantar ulcer and trimming of toenails  . I&D EXTREMITY  07/02/2011   Procedure: IRRIGATION AND DEBRIDEMENT EXTREMITY;  Surgeon: Toni ArthursJohn Hewitt, MD;  Location: [facility] OR;  Service: Orthopedics;  Laterality: Left;  I & D of Left Foot  . I&D EXTREMITY  07/06/2011   Procedure: IRRIGATION AND DEBRIDEMENT EXTREMITY;  Surgeon: Toni ArthursJohn Hewitt, MD;  Location: [facility] OR;  Service:  Orthopedics;  Laterality: Right;  IRRIGATION/DEBRIDEMENT RIGHT BELOW KNEE AMPUTATION  . TUBAL LIGATION  2006   Social History:  reports that  has never smoked. she has never used smokeless tobacco. She reports that she drinks about 1.2 oz of alcohol per week. She reports that she does not use drugs.  No Known Allergies Family History  Problem Relation Age of Onset  . Diabetes Mother   . Hypertension Mother   . Hyperlipidemia Mother   . Diabetes Father   . Cancer Father        prostate  . Hyperlipidemia Father   . Hypertension Father   . Diabetes Brother   . Peripheral Artery Disease Brother        has had toes amputated    Prior to Admission medications   Medication Sig Start Date End Date Taking? Authorizing Provider  acetaminophen (TYLENOL) 500 MG tablet Take 1,000 mg by mouth every 6 (six) hours as needed for moderate pain.   Yes [provider]  aspirin EC 81 MG tablet Take 81 mg by mouth daily as needed (chest pain).   Yes [provider]  calcium carbonate (TUMS - DOSED IN MG ELEMENTAL CALCIUM) 500 MG chewable tablet Chew 2 tablets (400 mg of elemental calcium total) by mouth 3 (three) times daily. 09/18/15  Yes Love, Evlyn KannerPamela S, PA-C  Menthol, Topical Analgesic, (ICY HOT EX) Apply 1 application topically 2 (two) times daily as needed (pain).   Yes [provider]  Naphazoline  HCl (CLEAR EYES OP) Place 1 drop into both eyes daily as needed (dry eyes/itching).   Yes [provider]  naproxen sodium (ALEVE) 220 MG tablet Take 220 mg by mouth daily as needed (pain).   Yes [provider]  prochlorperazine (COMPAZINE) 5 MG tablet Take 1-2 tablets (5-10 mg total) by mouth every 8 (eight) hours as needed for nausea. 09/18/15  Yes Love, Evlyn Kanner, PA-C   Physical Exam: Blood pressure (!) 153/81, pulse 91, temperature 98.9 F (37.2 C), temperature source Oral, resp. rate 16, height 5\' 7"  (1.702 m), weight 86.2 kg (190 lb), SpO2 98 %. Vitals:    03/06/17 2131 03/07/17 0446  BP: (!) 148/68 (!) 153/81  Pulse: 87 91  Resp: 16   Temp: 99.1 F (37.3 C) 98.9 F (37.2 C)  SpO2: 99% 98%     General:  Sitting up in bed eating lunch in no acute distress  Eyes: Pupils equal round reactive to light EOMI no scleral icterus  ENT: Ears clear nose without drainage oropharynx without erythema or exudate  Neck: Supple no JVD full range of motion no lymphadenopathy  Cardiovascular: Rate and rhythm no murmur gallop or rub left foot with compression stocking intact. Edema right below the knee amputation  Respiratory: Normal effort breath sounds slightly distant but I hear no wheeze no rhonchi no crackles  Abdomen: Obese positive bowel sounds throughout no guarding or rebounding  Skin: Warm and dry no rashes or lesions noted  Musculoskeletal: Joints without swelling/erythema right below the knee amputation. Tenderness and that right leg with decreased rom  Psychiatric: Calm cooperative  Neurologic: Alert and oriented 3 speech clear facial symmetry  Labs on Admission:  Basic Metabolic Panel: Recent Labs  Lab 03/02/17 1812 03/02/17 2323 03/04/17 0603  NA 131*  --  133*  K 5.3* 4.8 4.3  CL 100*  --  103  CO2 20*  --  20*  GLUCOSE 125*  --  104*  BUN 18  --  23*  CREATININE 1.26*  --  1.43*  CALCIUM 8.9  --  8.3*   Liver Function Tests: Recent Labs  Lab 03/02/17 1812  AST 50*  ALT 51  ALKPHOS 75  BILITOT 1.1  PROT 7.8  ALBUMIN 3.9   No results for input(s): LIPASE, AMYLASE in the last 168 hours. No results for input(s): AMMONIA in the last 168 hours. CBC: Recent Labs  Lab 03/02/17 1812 03/04/17 0603 03/05/17 0414  WBC 5.1 4.4 4.1  NEUTROABS 4.0  --   --   HGB 9.7* 7.3* 8.9*  HCT 30.0* 22.6* 27.5*  MCV 95.8 97.8 95.2  PLT 78* 70* 93*   Cardiac Enzymes: No results for input(s): CKTOTAL, CKMB, CKMBINDEX, TROPONINI in the last 168 hours. BNP: Invalid input(s): POCBNP CBG: Recent Labs  Lab 03/06/17 1111  03/06/17 1634 03/06/17 2134 03/07/17 0758 03/07/17 1219  GLUCAP 121* 124* 130* 137* 101*    Radiological Exams on Admission: Ct Angio Chest Pe W Or Wo Contrast  Result Date: 03/07/2017 CLINICAL DATA:  Shortness of breath.  Right femur fracture. EXAM: CT ANGIOGRAPHY CHEST WITH CONTRAST TECHNIQUE: Multidetector CT imaging of the chest was performed using the standard protocol during bolus administration of intravenous contrast. Multiplanar CT image reconstructions and MIPs were obtained to evaluate the vascular anatomy. CONTRAST:  54mL ISOVUE-370 IOPAMIDOL (ISOVUE-370) INJECTION 76% COMPARISON:  Chest x-ray from yesterday. FINDINGS: Cardiovascular: Satisfactory opacification of the pulmonary arteries to the segmental level. No evidence of pulmonary embolism. Borderline cardiomegaly. No pericardial  effusion. Normal caliber thoracic aorta. Mediastinum/Nodes: No enlarged mediastinal, hilar, or axillary lymph nodes. There is a 4.1 x 4.1 cm soft tissue nodule in the left thyroid lobe with central hypodensity. Slightly patulous esophagus. Lungs/Pleura: Bibasilar atelectasis. No focal consolidation, pleural effusion, or pneumothorax. No suspicious pulmonary nodule. Upper Abdomen: No acute abnormality. Musculoskeletal: No chest wall abnormality. No acute or significant osseous findings. Review of the MIP images confirms the above findings. IMPRESSION: 1. No evidence of pulmonary embolism. No acute intrathoracic process. 2. Large 4.1 cm predominantly soft tissue density nodule in the left thyroid lobe. Recommend thyroid ultrasound for further evaluation. Electronically Signed   By: Obie Dredge M.D.   On: 03/07/2017 10:48   Dg Chest Port 1 View  Result Date: 03/06/2017 CLINICAL DATA:  Fever. EXAM: PORTABLE CHEST 1 VIEW COMPARISON:  09/24/2015 FINDINGS: Cardiac silhouette is borderline enlarged, stable from prior study. No mediastinal or hilar masses. Clear lungs.  No pleural effusion or pneumothorax. Skeletal  structures are grossly intact. IMPRESSION: No active disease. Electronically Signed   By: Amie Portland M.D.   On: 03/06/2017 09:50     Time spent: 65 minutes   [facility] M [facility]   If 7PM-7AM, please contact night-coverage [email or web address] Password [email or web address] 03/07/2017, 2:02 PM

## 2017-03-07 NOTE — Progress Notes (Signed)
Physical Therapy Treatment Patient Details Name: Cheryl HansenMichelle L Wolf MRN: [id number] DOB: 01/23/1965 Today's Date: 03/07/2017    History of Present Illness Pt is a 52 y/o female medical history including uncontrolled type 2 diabetes, hypertension, right BKA due to diabetic ulcer and infection, history of obstructive uropathy with bilateral hydronephrosis in August 2017, CKD; Patient has an indwelling Foley catheter that was placed during her hospitalization initially in August 2017. Her most recent catheter has not been changed since November 2017. Pt sustained a fall on 2/26 out of her wheelchair. Patienthas been mostly wheelchair dependent since 2013 when her BKA was performed.Patient was found to have a minimally nondisplaced right distal femur fracture, now being treated non-operatively.     PT Comments    Patient pleasant eager to mobilize this pm. Pt required min A for functional transfers and short distance gait in room. Pt limited by bilat LE pain with ambulation. Therapist recommended working on bilat LE therex while in bed or recliner throughout the day. Continue to progress as tolerated with anticipated d/c to SNF for further skilled PT services.     Follow Up Recommendations  SNF;Supervision/Assistance - 24 hour     Equipment Recommendations  None recommended by PT    Recommendations for Other Services       Precautions / Restrictions Precautions Precautions: Fall Required Braces or Orthoses: Other Brace/Splint Other Brace/Splint: R hinged brace; R stump/shrinker Restrictions Weight Bearing Restrictions: Yes RLE Weight Bearing: Non weight bearing    Mobility  Bed Mobility Overal bed mobility: Needs Assistance Bed Mobility: Supine to Sit     Supine to sit: Supervision     General bed mobility comments: supervision for safety  Transfers Overall transfer level: Needs assistance Equipment used: Rolling walker (2 wheeled) Transfers: Sit to/from [employer];Stand Pivot  Transfers Sit to Stand: Min assist;+2 physical assistance;From elevated surface         General transfer comment: assist to power up into standing and extra time to gain balance upon standing; cues for safe hand placement and use of AD  Ambulation/Gait Ambulation/Gait assistance: Min assist;+2 physical assistance Ambulation Distance (Feet): 22 Feet Assistive device: Rolling walker (2 wheeled) Gait Pattern/deviations: Step-to pattern Gait velocity: decreased   General Gait Details: cues for posture and safe proximity to RW; chair follow due to pt becoming fatigued quickly   Stairs            Wheelchair Mobility    Modified Rankin (Stroke Patients Only)       Balance Overall balance assessment: Needs assistance Sitting-balance support: Feet supported Sitting balance-Leahy Scale: Good     Standing balance support: Bilateral upper extremity supported Standing balance-Leahy Scale: Poor Standing balance comment: reliant on UE support                             Cognition Arousal/Alertness: Awake/alert Behavior During Therapy: WFL for tasks assessed/performed Overall Cognitive Status: Within Functional Limits for tasks assessed                                        Exercises General Exercises - Lower Extremity Long Arc Quad: AROM;Both;5 reps;Seated Hip Flexion/Marching: AROM;Both;5 reps;Seated    General Comments        Pertinent Vitals/Pain Pain Assessment: Faces Faces Pain Scale: Hurts little more Pain Location: bilat LE Pain Descriptors / Indicators: Aching;Guarding;Sore Pain Intervention(s):  Limited activity within patient's tolerance;Monitored during session;Premedicated before session;Repositioned    Home Living                      Prior Function            PT Goals (current goals can now be found in the care plan section) Acute Rehab PT Goals Patient Stated Goal: return home  PT Goal Formulation: With  patient Time For Goal Achievement: 03/11/17 Potential to Achieve Goals: Poor Progress towards PT goals: Progressing toward goals    Frequency    Min 3X/week      PT Plan Current plan remains appropriate    Co-evaluation              AM-PAC PT "6 Clicks" Daily Activity  Outcome Measure  Difficulty turning over in bed (including adjusting bedclothes, sheets and blankets)?: A Little Difficulty moving from lying on back to sitting on the side of the bed? : A Little Difficulty sitting down on and standing up from a chair with arms (e.g., wheelchair, bedside commode, etc,.)?: Unable Help needed moving to and from a bed to chair (including a wheelchair)?: A Little Help needed walking in hospital room?: A Little Help needed climbing 3-5 steps with a railing? : A Lot 6 Click Score: 15    End of Session Equipment Utilized During Treatment: Gait belt Activity Tolerance: Patient limited by fatigue Patient left: in chair;with call bell/phone within reach Nurse Communication: Mobility status PT Visit Diagnosis: Unsteadiness on feet (R26.81);Other abnormalities of gait and mobility (R26.89);Pain Pain - Right/Left: Right Pain - part of body: Hip     Time: 1610-9604 PT Time Calculation (min) (ACUTE ONLY): 23 min  Charges:  $Gait Training: 8-22 mins $Therapeutic Activity: 8-22 mins                    G Codes:       Erline Levine, PTA Pager: [phone]     Carolynne Edouard 03/07/2017, 4:13 PM

## 2017-03-07 NOTE — Social Work (Signed)
CSW contacted financial counselor and they have met with patient already and will assist.  CSW will continue to follow for SNF placement in a facility. CSW received auth for for LOG SNF bed for 30 days per Assistant Clinical Director Z.Brooks.  CSW will f/u.  Elissa Hefty, LCSW Clinical Social Worker [phone]

## 2017-03-08 ENCOUNTER — Inpatient Hospital Stay (HOSPITAL_COMMUNITY): Payer: Self-pay

## 2017-03-08 DIAGNOSIS — S72336D Nondisplaced oblique fracture of shaft of unspecified femur, subsequent encounter for closed fracture with routine healing: Secondary | ICD-10-CM

## 2017-03-08 DIAGNOSIS — Z91199 Patient's noncompliance with other medical treatment and regimen due to unspecified reason: Secondary | ICD-10-CM

## 2017-03-08 DIAGNOSIS — Z9119 Patient's noncompliance with other medical treatment and regimen: Secondary | ICD-10-CM

## 2017-03-08 LAB — CULTURE, BLOOD (ROUTINE X 2)
Culture: NO GROWTH
Culture: NO GROWTH
Special Requests: ADEQUATE

## 2017-03-08 LAB — GLUCOSE, CAPILLARY
Glucose-Capillary: 106 mg/dL — ABNORMAL HIGH (ref 65–99)
Glucose-Capillary: 110 mg/dL — ABNORMAL HIGH (ref 65–99)

## 2017-03-08 LAB — CBC
HCT: 24.9 % — ABNORMAL LOW (ref 36.0–46.0)
Hemoglobin: 8 g/dL — ABNORMAL LOW (ref 12.0–15.0)
MCH: 30.9 pg (ref 26.0–34.0)
MCHC: 32.1 g/dL (ref 30.0–36.0)
MCV: 96.1 fL (ref 78.0–100.0)
Platelets: 141 10*3/uL — ABNORMAL LOW (ref 150–400)
RBC: 2.59 MIL/uL — ABNORMAL LOW (ref 3.87–5.11)
RDW: 14.6 % (ref 11.5–15.5)
WBC: 4 10*3/uL (ref 4.0–10.5)

## 2017-03-08 LAB — BASIC METABOLIC PANEL
Anion gap: 8 (ref 5–15)
BUN: 25 mg/dL — ABNORMAL HIGH (ref 6–20)
CO2: 21 mmol/L — ABNORMAL LOW (ref 22–32)
Calcium: 8.4 mg/dL — ABNORMAL LOW (ref 8.9–10.3)
Chloride: 108 mmol/L (ref 101–111)
Creatinine, Ser: 1.25 mg/dL — ABNORMAL HIGH (ref 0.44–1.00)
GFR calc Af Amer: 57 mL/min — ABNORMAL LOW (ref >60)
GFR calc non Af Amer: 49 mL/min — ABNORMAL LOW (ref >60)
Glucose, Bld: 105 mg/dL — ABNORMAL HIGH (ref 65–99)
Potassium: 4 mmol/L (ref 3.5–5.1)
Sodium: 137 mmol/L (ref 135–145)

## 2017-03-08 LAB — CALCITRIOL (1,25 DI-OH VIT D): Vit D, 1,25-Dihydroxy: 13.6 pg/mL — ABNORMAL LOW (ref 19.9–79.3)

## 2017-03-08 MED ORDER — METFORMIN HCL 500 MG PO TABS: 500.0000 mg | ORAL_TABLET | Freq: Every day | ORAL | 11 refills | Status: DC

## 2017-03-08 MED ORDER — BOOST BREEZE PO LIQD: 1.0000 | Freq: Three times a day (TID) | ORAL | 0 refills | Status: DC

## 2017-03-08 MED ORDER — BOOST / RESOURCE BREEZE PO LIQD CUSTOM
1.0000 | Freq: Three times a day (TID) | ORAL | Status: DC
  Administered 2017-03-08: 1 via ORAL

## 2017-03-08 MED ORDER — HYDROCODONE-ACETAMINOPHEN 5-325 MG PO TABS: 1.0000 | ORAL_TABLET | Freq: Four times a day (QID) | ORAL | 0 refills | Status: DC | PRN

## 2017-03-08 MED ORDER — CHOLECALCIFEROL 125 MCG (5000 UT) PO TABS: 5000.0000 [IU] | ORAL_TABLET | Freq: Every day | ORAL | Status: DC

## 2017-03-08 MED ORDER — ENOXAPARIN SODIUM 40 MG/0.4ML ~~LOC~~ SOLN: 40.0000 mg | SUBCUTANEOUS | 0 refills | Status: DC

## 2017-03-08 NOTE — Progress Notes (Signed)
Orthopedic Trauma Service Progress Note   Patient ID: [name] MRN: [id number] DOB/AGE: October 22, 1965 52 y.o.  Subjective:  No acute issues Pt has received bed offer from [facility], liaison to follow up with pt today   Pain in R leg is tolerable. Brace fitting well, as is shrinker   Appears pt refused CPAP last night  O2 sats at 96 % on RA currently  No complaints   Pt worried about court dates this month. On the 14th she has to go to court regarding foreclosure proceedings on her house and on the 26th she has a court date regarding her children in foster care.  She was instructed to contact her legal counsel to inform them on what is going on.   SW has given pt numerous resources for applying for disability. Pt needs to follow up in this as well and formally start the process by contacting the SSA. We will continue to follow along assist as needed.   Pt also needs PCP and would benefit from establishing care at the [facility]    Review of Systems  Constitutional: Negative for chills and fever.  Respiratory: Negative for shortness of breath and wheezing.   Cardiovascular: Negative for chest pain and palpitations.  Gastrointestinal: Negative for abdominal pain, nausea and vomiting.    Objective:   VITALS:   Vitals:   03/07/17 1428 03/07/17 1437 03/07/17 1900 03/08/17 0529  BP:  130/84 101/66 (!) 146/59  Pulse: 95 (!) 101 92 89  Resp: 16 16 16 14   Temp:  98.9 F (37.2 C) 98.9 F (37.2 C) 100 F (37.8 C)  TempSrc:  Oral Oral Oral  SpO2: 99% 97% (!) 89% 97%  Weight:      Height:        Estimated body mass index is 29.76 kg/m as calculated from the following:   Height as of this encounter: 5\' 7"  (1.702 m).   Weight as of this encounter: 86.2 kg (190 lb).   Intake/Output      03/04 0701 - 03/05 0700 03/05 0701 - 03/06 0700   P.O. 720    IV Piggyback     Total Intake(mL/kg) 720 (8.4)    Urine  (mL/kg/hr) 2800 (1.4)    Stool 1    Total Output 2801    Net -2081           LABS  Results for orders placed or performed during the hospital encounter of 03/02/17 (from the past 24 hour(s))  Glucose, capillary     Status: Abnormal   Collection Time: 03/07/17 12:19 PM  Result Value Ref Range   Glucose-Capillary 101 (H) 65 - 99 mg/dL  CBC     Status: Abnormal   Collection Time: 03/07/17  2:43 PM  Result Value Ref Range   WBC 4.9 4.0 - 10.5 K/uL   RBC 3.04 (L) 3.87 - 5.11 MIL/uL   Hemoglobin 9.2 (L) 12.0 - 15.0 g/dL   HCT 40.9 (L) 81.1 - 91.4 %   MCV 95.1 78.0 - 100.0 fL   MCH 30.3 26.0 - 34.0 pg   MCHC 31.8 30.0 - 36.0 g/dL   RDW 78.2 95.6 - 21.3 %   Platelets 140 (L) 150 - 400 K/uL  Comprehensive metabolic panel     Status: Abnormal   Collection Time: 03/07/17  2:43 PM  Result Value Ref Range   Sodium 135 135 - 145 mmol/L   Potassium 4.1 3.5 - 5.1 mmol/L  Chloride 104 101 - 111 mmol/L   CO2 20 (L) 22 - 32 mmol/L   Glucose, Bld 155 (H) 65 - 99 mg/dL   BUN 28 (H) 6 - 20 mg/dL   Creatinine, Ser 1.611.60 (H) 0.44 - 1.00 mg/dL   Calcium 8.4 (L) 8.9 - 10.3 mg/dL   Total Protein 7.3 6.5 - 8.1 g/dL   Albumin 3.2 (L) 3.5 - 5.0 g/dL   AST 30 15 - 41 U/L   ALT 25 14 - 54 U/L   Alkaline Phosphatase 72 38 - 126 U/L   Total Bilirubin 0.6 0.3 - 1.2 mg/dL   GFR calc non Af Amer 37 (L) >60 mL/min   GFR calc Af Amer 42 (L) >60 mL/min   Anion gap 11 5 - 15  TSH     Status: None   Collection Time: 03/07/17  2:43 PM  Result Value Ref Range   TSH 2.012 0.350 - 4.500 uIU/mL  Glucose, capillary     Status: Abnormal   Collection Time: 03/07/17  4:27 PM  Result Value Ref Range   Glucose-Capillary 131 (H) 65 - 99 mg/dL  Glucose, capillary     Status: Abnormal   Collection Time: 03/07/17 10:00 PM  Result Value Ref Range   Glucose-Capillary 158 (H) 65 - 99 mg/dL  CBC     Status: Abnormal   Collection Time: 03/08/17  5:13 AM  Result Value Ref Range   WBC 4.0 4.0 - 10.5 K/uL   RBC 2.59  (L) 3.87 - 5.11 MIL/uL   Hemoglobin 8.0 (L) 12.0 - 15.0 g/dL   HCT 09.624.9 (L) 04.536.0 - 40.946.0 %   MCV 96.1 78.0 - 100.0 fL   MCH 30.9 26.0 - 34.0 pg   MCHC 32.1 30.0 - 36.0 g/dL   RDW 81.114.6 91.411.5 - 78.215.5 %   Platelets 141 (L) 150 - 400 K/uL  Basic metabolic panel     Status: Abnormal   Collection Time: 03/08/17  5:13 AM  Result Value Ref Range   Sodium 137 135 - 145 mmol/L   Potassium 4.0 3.5 - 5.1 mmol/L   Chloride 108 101 - 111 mmol/L   CO2 21 (L) 22 - 32 mmol/L   Glucose, Bld 105 (H) 65 - 99 mg/dL   BUN 25 (H) 6 - 20 mg/dL   Creatinine, Ser 9.561.25 (H) 0.44 - 1.00 mg/dL   Calcium 8.4 (L) 8.9 - 10.3 mg/dL   GFR calc non Af Amer 49 (L) >60 mL/min   GFR calc Af Amer 57 (L) >60 mL/min   Anion gap 8 5 - 15  Glucose, capillary     Status: Abnormal   Collection Time: 03/08/17  7:39 AM  Result Value Ref Range   Glucose-Capillary 106 (H) 65 - 99 mg/dL   Comment 1 Notify RN    Comment 2 Document in Chart      PHYSICAL EXAM:   Gen: awake and alert, appears comfortable  Lungs: breathing unlabored, no accessory muscle use  Cardiac: RRR, s1 and s2 Abd: + BS, NTND Ext:       Right Lower Extremity                 Hinged brace fitting well, it is unlocked  Compressive sleeve fitting well No acute changes in exam otherwise Mild tenderness to R distal femur  Continues to tolerate gentle ROM of R knee     Assessment/Plan:     Principal Problem:   Femur fracture (HCC) Active Problems:  Hypertension   Neuropathy (HCC)   Peripheral vascular disease, unspecified (HCC)   Controlled diabetes mellitus type 2 with complications (HCC)   S/P BKA (below knee amputation) unilateral (HCC)   Anemia of chronic disease   Urinary retention   CKD (chronic kidney disease)   Benign essential HTN   Diabetic peripheral neuropathy (HCC)   Chronic indwelling Foley catheter   Hypoxia   Thyroid nodule   Anti-infectives (From admission, onward)   Start      Dose/Rate Route Frequency Ordered Stop   03/03/17 1200  ampicillin-sulbactam (UNASYN) 1.5 g in sodium chloride 0.9 % 100 mL IVPB  Status:  Discontinued     1.5 g 200 mL/hr over 30 Minutes Intravenous Every 8 hours 03/03/17 1049 03/07/17 1310   03/02/17 2045  piperacillin-tazobactam (ZOSYN) IVPB 3.375 g  Status:  Discontinued     3.375 g 100 mL/hr over 30 Minutes Intravenous  Once 03/02/17 2032 03/02/17 2111    .  POD/HD#: 10  52 y/o black female with numerous medical comorbidities with R BKA and acute R distal femur fracture after fall out of wheelchair, same foley catheter has been in place >1 year. Has not been on any medications for >1 year for DM     -fall    - minimally displaced R distal femur fracture, R BKA             Non-op tx             NWB R leg             Hinged brace, unlocked for full knee ROM                         Gentle PROM/AROM ok              Ice PRN             Continue with compressive sleeve/shrinker. Can remove as needed  Ok to shower with brace and sleeve off. Would recommend bathing in shower chair    Pt never learned how to use prosthesis after BKA in 2013. She has been WC dependent since then                  There are extensive social issues complicating pts clinical picture                          Chronic medical conditions                         Death of her husband                         Children in foster care                         House going into or currently in foreclosure                          EtOH use?   Lack of understanding on how to navigate application process for disability    - Pain management:             continue with current regimen    - ABL anemia/Hemodynamics            stable      -  Medical issues              Renal failure, chonic foley cath                         new foley placed during this admission                          Appreciate Urology consult                          abx dc'd yesterday    New  cultures without growth   Will need to follow up with [facility] in about 2 weeks               Hyponatremia                          improved                         dc IVF                DM                          A1C is good                          Continue with current tx    CHO mod diet               Hypoxia                          CTA negative for PE                         History sounds like undiagnosed OSA                         appreciate IM input                                      Will likely need outpt sleep study                                                        refused CPAP last night                Thyroid nodule                         nodule appears chronic    Ultrasound ordered back in 2017 but not done   Ultrasound done this am, await reading                 General wellness   Establish primary care with community health and wellness center                          Will need assistance with disability paperwork                         ?  Component of depression given all of her life stressors                          outpt psychology eval if we can arrange transportation?                           Nutrition appears to be suboptimal- ? Lacks appropriate resources to feed self                           Dietician consult    Added boost shakes TID    - DVT/PE prophylaxis:             Lovenox while inpatient   lovenox while at snf as well    - ID:              unasyn dc'd on 03/07/2017   - Activity:             PT/OT  ROM R knee in hinged brace as needed    - FEN/GI prophylaxis/Foley/Lines:             CHO mod diet              NS    - Impediments to fracture healing:             DM              CKD    - Dispo:             continue with therapy              SNF today              Looking into additional resources to provide assistance    Follow up with ortho in 10 days or so   Follow up with urology in 2 weeks   Establish care  with PCP   Apply for disability       Mearl Latin, PA-C [facility] [phone] (P) [phone] Traci Sermon (C) 03/08/2017, 9:46 AM

## 2017-03-08 NOTE — Clinical Social Work Placement (Signed)
   CLINICAL SOCIAL WORK PLACEMENT  NOTE  Date:  03/08/2017  Patient Details  Name: [name] MRN: [id number] Date of Birth: 05/31/1965  Clinical Social Work is seeking post-discharge placement for this patient at the Skilled  Nursing Facility level of care (*CSW will initial, date and re-position this form in  chart as items are completed):  Yes   Patient/family provided with [facility] Clinical Social Work Department's list of facilities offering this level of care within the geographic area requested by the patient (or if unable, by the patient's family).  Yes   Patient/family informed of their freedom to choose among providers that offer the needed level of care, that participate in Medicare, Medicaid or managed care program needed by the patient, have an available bed and are willing to accept the patient.  Yes   Patient/family informed of [facility]'s ownership interest in [facility] and [facility], as well as of the fact that they are under no obligation to receive care at these facilities.  PASRR submitted to EDS on       PASRR number received on       Existing PASRR number confirmed on 03/06/17     FL2 transmitted to all facilities in geographic area requested by pt/family on       FL2 transmitted to all facilities within larger geographic area on 03/06/17     Patient informed that his/her managed care company has contracts with or will negotiate with certain facilities, including the following:        Yes   Patient/family informed of bed offers received.  [facility]     Physician recommends and patient chooses bed at      [facility] to [facility] on 03/08/17.  Patient to be transferred to facility by [facility]     Patient family notified on 03/08/17 of transfer.  Name of family member notified:  pt responsible for self     PHYSICIAN        Additional Comment:    _______________________________________________ Cheryl MoorePatricia V Brogan Martis, LCSW 03/08/2017, 11:23 AM

## 2017-03-08 NOTE — Plan of Care (Signed)
  Completed/[facility] Behavior/Discharge Planning: Ability to manage health-related needs will improve 03/08/2017 1553 - Completed/Met by Emmaline Life, RN Clinical Measurements: Ability to maintain clinical measurements within normal limits will improve 03/08/2017 1553 - Completed/Met by Emmaline Life, RN Will remain free from infection 03/08/2017 1553 - Completed/Met by Emmaline Life, RN Diagnostic test results will improve 03/08/2017 1553 - Completed/Met by Emmaline Life, RN Respiratory complications will improve 03/08/2017 1553 - Completed/Met by Emmaline Life, RN Cardiovascular complication will be avoided 03/08/2017 1553 - Completed/Met by Emmaline Life, RN Activity: Risk for activity intolerance will decrease 03/08/2017 1553 - Completed/Met by Emmaline Life, RN Nutrition: Adequate nutrition will be maintained 03/08/2017 1553 - Completed/Met by Emmaline Life, RN Coping: Level of anxiety will decrease 03/08/2017 1553 - Completed/Met by Emmaline Life, RN Elimination: Will not experience complications related to bowel motility 03/08/2017 1553 - Completed/Met by Emmaline Life, RN Will not experience complications related to urinary retention 03/08/2017 1553 - Completed/Met by Emmaline Life, RN Pain Managment: General experience of comfort will improve 03/08/2017 1553 - Completed/Met by Emmaline Life, RN Safety: Ability to remain free from injury will improve 03/08/2017 1553 - Completed/Met by Emmaline Life, RN Skin Integrity: Risk for impaired skin integrity will decrease 03/08/2017 1553 - Completed/Met by Emmaline Life, RN

## 2017-03-08 NOTE — Social Work (Addendum)
Clinical Social Worker facilitated patient discharge including contacting patient family and facility to confirm patient discharge plans.  Clinical information faxed to facility and family agreeable with plan.    CSW arranged [facility] at [facility].    RN to call [phone] to give  report prior to discharge. Pt going to Room158A.  Clinical Social Worker will sign off for now as social work intervention is no longer needed. Please consult us again if new need arises.  Keene BreathPatricia Damarrion Mimbs, LCSW Clinical Social Worker [phone]

## 2017-03-08 NOTE — Care Management (Signed)
Case manager has arranged an appointment for patient to establish a medical Home. Thursday, March 31, 2017 at 10:30am at the [facility], [address], [place] [phone]. This information has been placed on discharge paperwork.

## 2017-03-08 NOTE — Discharge Instructions (Signed)
[facility] Discharge Instructions   General Discharge Instructions  WEIGHT BEARING STATUS: Nonweightbearing R leg   RANGE OF MOTION/ACTIVITY: gentle range of motion of R knee in brace. Keep hinged brace unlocked   Wound Care: remove compressive sleeve daily to evaluate skin. Ok to shower without brace or sleeve on. Have patient bathe in shower chair   DVT/PE prophylaxis: Lovenox 40 mg sq injection daily x 21 days  Diet: Carb mod diet           Can use over the counter stool softeners and bowel preparations, such as Miralax, to help with bowel movements.  Narcotics can be constipating.  Be sure to drink plenty of fluids  PAIN MEDICATION USE AND EXPECTATIONS  You have likely been given narcotic medications to help control your pain.  After a traumatic event that results in an fracture (broken bone) with or without surgery, it is ok to use narcotic pain medications to help control one's pain.  We understand that everyone responds to pain differently and each individual patient will be evaluated on a regular basis for the continued need for narcotic medications. Ideally, narcotic medication use should last no more than 6-8 weeks (coinciding with fracture healing).   As a patient it is your responsibility as well to monitor narcotic medication use and report the amount and frequency you use these medications when you come to your office visit.   We would also advise that if you are using narcotic medications, you should take a dose prior to therapy to maximize you participation.  IF YOU ARE ON NARCOTIC MEDICATIONS IT IS NOT PERMISSIBLE TO OPERATE A MOTOR VEHICLE (MOTORCYCLE/CAR/TRUCK/MOPED) OR HEAVY MACHINERY DO NOT MIX NARCOTICS WITH OTHER CNS (CENTRAL NERVOUS SYSTEM) DEPRESSANTS SUCH AS ALCOHOL   STOP SMOKING OR USING NICOTINE PRODUCTS!!!!  As discussed nicotine severely impairs your body's ability to heal surgical and traumatic wounds but also impairs bone healing.  Wounds and  bone heal by forming microscopic blood vessels (angiogenesis) and nicotine is a vasoconstrictor (essentially, shrinks blood vessels).  Therefore, if vasoconstriction occurs to these microscopic blood vessels they essentially disappear and are unable to deliver necessary nutrients to the healing tissue.  This is one modifiable factor that you can do to dramatically increase your chances of healing your injury.    (This means no smoking, no nicotine gum, patches, etc)  DO NOT USE NONSTEROIDAL ANTI-INFLAMMATORY DRUGS (NSAID'S)  Using products such as Advil (ibuprofen), Aleve (naproxen), Motrin (ibuprofen) for additional pain control during fracture healing can delay and/or prevent the healing response.  If you would like to take over the counter (OTC) medication, Tylenol (acetaminophen) is ok.  However, some narcotic medications that are given for pain control contain acetaminophen as well. Therefore, you should not exceed more than 4000 mg of tylenol in a day if you do not have liver disease.  Also note that there are may OTC medicines, such as cold medicines and allergy medicines that my contain tylenol as well.  If you have any questions about medications and/or interactions please ask your doctor/PA or your pharmacist.      ICE AND ELEVATE INJURED/OPERATIVE EXTREMITY  Using ice and elevating the injured extremity above your heart can help with swelling and pain control.  Icing in a pulsatile fashion, such as 20 minutes on and 20 minutes off, can be followed.    Do not place ice directly on skin. Make sure there is a barrier between to skin and the ice pack.  Using frozen items such as frozen peas works well as the conform nicely to the are that needs to be iced.  USE AN ACE WRAP OR TED HOSE FOR SWELLING CONTROL  In addition to icing and elevation, Ace wraps or TED hose are used to help limit and resolve swelling.  It is recommended to use Ace wraps or TED hose until you are informed to stop.    When  using Ace Wraps start the wrapping distally (farthest away from the body) and wrap proximally (closer to the body)   Example: If you had surgery on your leg or thing and you do not have a splint on, start the ace wrap at the toes and work your way up to the thigh        If you had surgery on your upper extremity and do not have a splint on, start the ace wrap at your fingers and work your way up to the upper arm  IF YOU ARE IN A SPLINT OR CAST DO NOT REMOVE IT FOR ANY REASON   If your splint gets wet for any reason please contact the office immediately. You may shower in your splint or cast as long as you keep it dry.  This can be done by wrapping in a cast cover or garbage back (or similar)  Do Not stick any thing down your splint or cast such as pencils, money, or hangers to try and scratch yourself with.  If you feel itchy take benadryl as prescribed on the bottle for itching  IF YOU ARE IN A CAM BOOT (BLACK BOOT)  You may remove boot periodically. Perform daily dressing changes as noted below.  Wash the liner of the boot regularly and wear a sock when wearing the boot. It is recommended that you sleep in the boot until told otherwise  CALL THE OFFICE WITH ANY QUESTIONS OR CONCERNS: [phone]

## 2017-03-08 NOTE — Progress Notes (Signed)
Discharge  [facility].   PIV's removed and pt placed in travel gown for transport. Pt reports 2/10 pain, which is chronic for her. Pt's belongings were packed, including dentures and sent with pt via [facility].

## 2017-03-08 NOTE — Discharge Summary (Signed)
Orthopaedic Trauma Service (OTS)  Patient ID: [name] MRN: [id number] DOB/AGE: 09-15-1965 52 y.o.  Admit date: 03/02/2017 Discharge date: 03/08/2017  Admission Diagnoses: Right supracondylar femur fracture History of right BKA Indwelling Foley catheter Chronic urinary retention Type 2 diabetes PVD CKD Hypertension Neuropathy Poor compliance with medical instructions  Discharge Diagnoses:  Principal Problem:   Femur fracture (HCC) Active Problems:   Hypertension   Peripheral vascular disease, unspecified (HCC)   Controlled diabetes mellitus type 2 with complications (HCC)   S/P BKA (below knee amputation) unilateral (HCC)   Anemia of chronic disease   Urinary retention   CKD (chronic kidney disease)   Diabetic peripheral neuropathy (HCC)   Chronic indwelling Foley catheter   Hypoxia   Thyroid nodule   Poor compliance   Past Medical History:  Diagnosis Date  . Anemia   . Arthritis    "spine, hands" (11/25/2015)  . Back pain    "all my back; probably 3 times/week" (11/25/2015)  . Chronic indwelling Foley catheter 03/04/2017  . Diabetic foot ulcer associated with type 2 diabetes mellitus (HCC)   . Hypertension   . Intracerebral hemorrhage (HCC) As a teenager    States she had burst blood vessel as teenager, now with resultant minor visual field and hearing deficits  . Neuropathy   . Stroke [facility]) ~ 1982   "my feeling on my RLE, right lower eye vision,  & hearing out of my right ear not the same since" (11/25/2015)  . Type II diabetes mellitus (HCC)      Procedures Performed: No surgical procedures performed this admission   CTA angio on 03/07/2017- negative for acute PE, incidental thyroid nodule  Thyroid ultrasound on 03/08/2017- recommend biopsy, to be done as outpatient   PRBC transfusion x 1 unit on 03/04/2017  Discharged Condition: stable  Hospital Course:   Patient is a 52 year old black female with numerous medical comorbidities including a  history of a right BKA in 2013.  Patient sustained a ground-level fall at home on 03/02/2017.  She presented to the emergency department and was found to have a nondisplaced right distal femur fracture.  Patient was seen and evaluated by the on-call orthopedic team however due to the complexity of the injury it was felt that the patient needed evaluation and treatment by the orthopedic trauma service.  Patient was seen and evaluated by the orthopedic trauma service on 03/03/2017.  It was also noted that the patient has a chronic indwelling Foley catheter that has been unchanged since November 2017.  We did obtain a urology consult at this time and her Foley was switched out.  She was also started on Unasyn to cover for pathogens based off of the initial evaluation of her urinalysis and urine culture.  Her initial urine culture did appear to be contaminated and a new culture was obtained.  Ultimately the new culture did not grow out any bacteria.  Her Unasyn was discontinued after 5 days of therapy.  Patient will follow up with urology in 2-3 weeks from discharge for further evaluation of her urinary retention which at this point is chronic.  Again she failed to follow-up after her initial hospitalizations back in 2017.  Failure to follow-up with due to  social factors: Patient's husband passed away several years ago, she is unable to drive, children are in foster care, she has severe financial constraints, still has not filed for disability.   With respect to her right distal femur fracture minimal displacement present.  Due  to her overall medical picture we felt that she would be best treated with nonoperative management.  She was fitted for a hinged knee brace and a new compressive sleeve was applied to her stump which patient is tolerated well.  She did work fairly well with therapies during her hospital stay.  Patient lives alone and again her children are in foster care.  She does not have anybody to assist her  at home.  Thus we felt the safest venue for discharge would be a skilled nursing center.  Patient was ultimately agreeable to this.  We did get social work and case management on board given the numerous social issues present.   Patient did require transfusion with 1 unit of packed red blood cells on 03/04/2017.  It was 2 days before she presented to the emergency department after sustaining a fall and thus appears to be appropriate for her injury along with the fact that she has chronic anemia due to her chronic diseases.  Patient tolerated transfusion well no issues were noted.   Patient did have some nighttime hypoxia starting on Friday evening.  Supplemental O2 was used with resolution of her hypoxia.  During the daytime hours she was able to maintain appropriate O2 sats without supplemental oxygen but again continued to desat at night when sleeping.  Further evaluation was suggestive of undiagnosed sleep apnea.  Given persistent hypoxia we did check a CT angiogram of her chest as there was a delayed period from initial injury to treatment along with immobility.  This was negative for acute PE.  Incidentally a thyroid nodule was also noted.  Thyroid ultrasound was performed in the hospital as well and recommendations are for formal biopsy as an outpatient.  We did obtain a medicine consult regarding her hypoxia.  Recommendations were made for CPAP versus supplemental O2 at night.  Medicine also recommends doing low-dose metformin at discharge.  Patient's hemoglobin A1c on admission is 5.4%.  Other labs indicate poor nutrition and suspect this is reflective of her social impediments.  Patient will be discharged on supplemental shakes to augment her regular meals when discharged to the skilled nursing center.  Metabolic bone workup is also notable for vitamin D deficiency and she started on supplementation for this as well.   Patient ultimately deemed to be stable for discharge on 03/08/2017.  Bed offer was  received from [facility] skilled nursing center.  Patient will be discharged on 03/08/2017.  She will follow-up with orthopedics in 10 days for reevaluation of her right distal femur fracture.  She is nonweightbearing on her right leg but can perform unrestricted range of motion of her right knee in the hinged knee brace.  She will also follow-up with urology in 2-3 weeks for reevaluation of her urinary retention.  An appointment has also been made to establish primary care at the [facility].  This appointment is on March 28 at 10:30 AM.  Patient urology and orthopedic appointment will need to be made by the skilled nursing center.  Patient is also been given numerous resources with respect to starting her paperwork for disability.  Patient also has 2 Pending Court cases this month.  The first is pertaining to foreclosure proceedings on her house and the second pertains to her children who are currently in foster care.  We did inform her to contact her legal counsel regarding these proceedings to inform them of her current situation.  She does not have legal counsel regarding  her first case however hopefully her legal counsel for her second case can provide some additional information.  Social work is also provided resources and contact information for the patient.   Patient discharged in stable condition on 03/08/2017  Consults: urology and internal medicine   Significant Diagnostic Studies:   labs:   Results for [name], [name] (MRN [id number]) as of 03/08/2017 11:29  Ref. Range 03/03/2017 10:09  Hemoglobin A1C Latest Ref Range: 4.8 - 5.6 % 5.4   Results for [name], [name] (MRN [id number]) as of 03/08/2017 11:01  Ref. Range 03/08/2017 05:13  Sodium Latest Ref Range: 135 - 145 mmol/L 137  Potassium Latest Ref Range: 3.5 - 5.1 mmol/L 4.0  Chloride Latest Ref Range: 101 - 111 mmol/L 108  CO2 Latest Ref Range: 22 - 32 mmol/L 21 (L)  Glucose Latest Ref Range: 65 - 99  mg/dL 347 (H)  BUN Latest Ref Range: 6 - 20 mg/dL 25 (H)  Creatinine Latest Ref Range: 0.44 - 1.00 mg/dL 4.25 (H)  Calcium Latest Ref Range: 8.9 - 10.3 mg/dL 8.4 (L)  Anion gap Latest Ref Range: 5 - 15  8  GFR, Est Non African American Latest Ref Range: >60 mL/min 49 (L)  GFR, Est African American Latest Ref Range: >60 mL/min 57 (L)  WBC Latest Ref Range: 4.0 - 10.5 K/uL 4.0  RBC Latest Ref Range: 3.87 - 5.11 MIL/uL 2.59 (L)  Hemoglobin Latest Ref Range: 12.0 - 15.0 g/dL 8.0 (L)  HCT Latest Ref Range: 36.0 - 46.0 % 24.9 (L)  MCV Latest Ref Range: 78.0 - 100.0 fL 96.1  MCH Latest Ref Range: 26.0 - 34.0 pg 30.9  MCHC Latest Ref Range: 30.0 - 36.0 g/dL 95.6  RDW Latest Ref Range: 11.5 - 15.5 % 14.6  Platelets Latest Ref Range: 150 - 400 K/uL 141 (L)   CBG (last 3)  Recent Labs    03/07/17 1627 03/07/17 2200 03/08/17 0739  GLUCAP 131* 158* 106*   Results for [name], [name] (MRN [id number]) as of 03/08/2017 11:01  Ref. Range 03/07/2017 14:43  TSH Latest Ref Range: 0.350 - 4.500 uIU/mL 2.012   Results for [name], [name] (MRN [id number]) as of 03/08/2017 11:01  Ref. Range 03/04/2017 06:03  PREALBUMIN Latest Ref Range: 18 - 38 mg/dL 06.3 (L)  GFR, Est Non African American Latest Ref Range: >60 mL/min 42 (L)  GFR, Est African American Latest Ref Range: >60 mL/min 49 (L)  Vitamin D, 25-Hydroxy Latest Ref Range: 30.0 - 100.0 ng/mL <4.0 (L)   microbiology: urine culture: negative  radiology: CT scan: CT angio of chest    IMPRESSION: 1. No evidence of pulmonary embolism. No acute intrathoracic process. 2. Large 4.1 cm predominantly soft tissue density nodule in the left thyroid lobe. Recommend thyroid ultrasound for further evaluation.    Ultrasound: thyroid U/S IMPRESSION: Left mid thyroid nodule meets criteria for biopsy, as designated by the newly established ACR TI-RADS criteria, and referral for biopsy is recommended.  Recommendations follow those established by the new  ACR TI-RADS criteria (J Am Coll Radiol 2017;14:587-595).  Treatments: IV hydration, antibiotics: Unasyn, analgesia: norco and oxy IR, anticoagulation: LMW heparin, insulin: Novolog SSI and therapies: PT, OT, RN and SW  Discharge Exam:  Orthopedic Trauma Service Progress Note    Patient ID: [name] MRN: [id number] DOB/AGE: [birth year] 51 y.o.   Subjective:   No acute issues Pt has received bed offer from [facility], liaison to follow up with pt today  Pain in R leg is tolerable. Brace fitting well, as is shrinker    Appears pt refused CPAP last night  O2 sats at 96 % on RA currently  No complaints    Pt worried about court dates this month. On the 14th she has to go to court regarding foreclosure proceedings on her house and on the 26th she has a court date regarding her children in foster care.  She was instructed to contact her legal counsel to inform them on what is going on.    SW has given pt numerous resources for applying for disability. Pt needs to follow up in this as well and formally start the process by contacting the [employer]SA. We will continue to follow along assist as needed.    Pt also needs PCP and would benefit from establishing care at the [facility]      Review of Systems  Constitutional: Negative for chills and fever.  Respiratory: Negative for shortness of breath and wheezing.   Cardiovascular: Negative for chest pain and palpitations.  Gastrointestinal: Negative for abdominal pain, nausea and vomiting.      Objective:    VITALS:   Vitals:    03/07/17 1428 03/07/17 1437 03/07/17 1900 03/08/17 0529  BP:   130/84 101/66 (!) 146/59  Pulse: 95 (!) 101 92 89  Resp: 16 16 16 14   Temp:   98.9 F (37.2 C) 98.9 F (37.2 C) 100 F (37.8 C)  TempSrc:   Oral Oral Oral  SpO2: 99% 97% (!) 89% 97%  Weight:          Height:              Estimated body mass index is 29.76 kg/m as calculated from the following:    Height as of this encounter: 5\' 7"  (1.702 m).   Weight as of this encounter: 86.2 kg (190 lb).     Intake/Output      03/04 0701 - 03/05 0700 03/05 0701 - 03/06 0700   P.O. 720    IV Piggyback     Total Intake(mL/kg) 720 (8.4)    Urine (mL/kg/hr) 2800 (1.4)    Stool 1    Total Output 2801    Net -2081            LABS   Lab Results Last 24 Hours       Results for orders placed or performed during the hospital encounter of 03/02/17 (from the past 24 hour(s))  Glucose, capillary     Status: Abnormal    Collection Time: 03/07/17 12:19 PM  Result Value Ref Range    Glucose-Capillary 101 (H) 65 - 99 mg/dL  CBC     Status: Abnormal    Collection Time: 03/07/17  2:43 PM  Result Value Ref Range    WBC 4.9 4.0 - 10.5 K/uL    RBC 3.04 (L) 3.87 - 5.11 MIL/uL    Hemoglobin 9.2 (L) 12.0 - 15.0 g/dL    HCT 56.2 (L) 13.0 - 46.0 %    MCV 95.1 78.0 - 100.0 fL    MCH 30.3 26.0 - 34.0 pg    MCHC 31.8 30.0 - 36.0 g/dL    RDW 86.5 78.4 - 69.6 %    Platelets 140 (L) 150 - 400 K/uL  Comprehensive metabolic panel     Status: Abnormal    Collection Time: 03/07/17  2:43 PM  Result Value Ref Range    Sodium 135 135 -  145 mmol/L    Potassium 4.1 3.5 - 5.1 mmol/L    Chloride 104 101 - 111 mmol/L    CO2 20 (L) 22 - 32 mmol/L    Glucose, Bld 155 (H) 65 - 99 mg/dL    BUN 28 (H) 6 - 20 mg/dL    Creatinine, Ser 6.04 (H) 0.44 - 1.00 mg/dL    Calcium 8.4 (L) 8.9 - 10.3 mg/dL    Total Protein 7.3 6.5 - 8.1 g/dL    Albumin 3.2 (L) 3.5 - 5.0 g/dL    AST 30 15 - 41 U/L    ALT 25 14 - 54 U/L    Alkaline Phosphatase 72 38 - 126 U/L    Total Bilirubin 0.6 0.3 - 1.2 mg/dL    GFR calc non Af Amer 37 (L) >60 mL/min    GFR calc Af Amer 42 (L) >60 mL/min    Anion gap 11 5 - 15  TSH     Status: None    Collection Time: 03/07/17  2:43 PM  Result Value Ref Range    TSH 2.012 0.350 - 4.500 uIU/mL  Glucose, capillary     Status: Abnormal    Collection Time: 03/07/17  4:27 PM  Result Value Ref Range     Glucose-Capillary 131 (H) 65 - 99 mg/dL  Glucose, capillary     Status: Abnormal    Collection Time: 03/07/17 10:00 PM  Result Value Ref Range    Glucose-Capillary 158 (H) 65 - 99 mg/dL  CBC     Status: Abnormal    Collection Time: 03/08/17  5:13 AM  Result Value Ref Range    WBC 4.0 4.0 - 10.5 K/uL    RBC 2.59 (L) 3.87 - 5.11 MIL/uL    Hemoglobin 8.0 (L) 12.0 - 15.0 g/dL    HCT 54.0 (L) 98.1 - 46.0 %    MCV 96.1 78.0 - 100.0 fL    MCH 30.9 26.0 - 34.0 pg    MCHC 32.1 30.0 - 36.0 g/dL    RDW 19.1 47.8 - 29.5 %    Platelets 141 (L) 150 - 400 K/uL  Basic metabolic panel     Status: Abnormal    Collection Time: 03/08/17  5:13 AM  Result Value Ref Range    Sodium 137 135 - 145 mmol/L    Potassium 4.0 3.5 - 5.1 mmol/L    Chloride 108 101 - 111 mmol/L    CO2 21 (L) 22 - 32 mmol/L    Glucose, Bld 105 (H) 65 - 99 mg/dL    BUN 25 (H) 6 - 20 mg/dL    Creatinine, Ser 6.21 (H) 0.44 - 1.00 mg/dL    Calcium 8.4 (L) 8.9 - 10.3 mg/dL    GFR calc non Af Amer 49 (L) >60 mL/min    GFR calc Af Amer 57 (L) >60 mL/min    Anion gap 8 5 - 15  Glucose, capillary     Status: Abnormal    Collection Time: 03/08/17  7:39 AM  Result Value Ref Range    Glucose-Capillary 106 (H) 65 - 99 mg/dL    Comment 1 Notify RN      Comment 2 Document in Chart            PHYSICAL EXAM:    Gen: awake and alert, appears comfortable  Lungs: breathing unlabored, no accessory muscle use  Cardiac: RRR, s1 and s2 Abd: + BS, NTND Ext:       Right Lower  Extremity                                                  Hinged brace fitting well, it is unlocked              Compressive sleeve fitting well             No acute changes in exam otherwise             Mild tenderness to R distal femur              Continues to tolerate gentle ROM of R knee        Assessment/Plan:      Principal Problem:   Femur fracture (HCC) Active Problems:   Hypertension   Neuropathy (HCC)   Peripheral vascular disease, unspecified  (HCC)   Controlled diabetes mellitus type 2 with complications (HCC)   S/P BKA (below knee amputation) unilateral (HCC)   Anemia of chronic disease   Urinary retention   CKD (chronic kidney disease)   Benign essential HTN   Diabetic peripheral neuropathy (HCC)   Chronic indwelling Foley catheter   Hypoxia   Thyroid nodule               Anti-infectives (From admission, onward)    Start     Dose/Rate Route Frequency Ordered Stop    03/03/17 1200   ampicillin-sulbactam (UNASYN) 1.5 g in sodium chloride 0.9 % 100 mL IVPB  Status:  Discontinued     1.5 g 200 mL/hr over 30 Minutes Intravenous Every 8 hours 03/03/17 1049 03/07/17 1310    03/02/17 2045   piperacillin-tazobactam (ZOSYN) IVPB 3.375 g  Status:  Discontinued     3.375 g 100 mL/hr over 30 Minutes Intravenous  Once 03/02/17 2032 03/02/17 2111     .   POD/HD#: 29   52 y/o black female with numerous medical comorbidities with R BKA and acute R distal femur fracture after fall out of wheelchair, same foley catheter has been in place >1 year. Has not been on any medications for >1 year for DM     -fall    - minimally displaced R distal femur fracture, R BKA             Non-op tx             NWB R leg             Hinged brace, unlocked for full knee ROM                         Gentle PROM/AROM ok              Ice PRN             Continue with compressive sleeve/shrinker. Can remove as needed             Ok to shower with brace and sleeve off. Would recommend bathing in shower chair                Pt never learned how to use prosthesis after BKA in 2013. She has been WC dependent since then                  There are extensive social issues complicating pts clinical picture  Chronic medical conditions                         Death of her husband                         Children in foster care                         House going into or currently in foreclosure                          EtOH use?                          Lack of understanding on how to navigate application process for disability    - Pain management:             continue with current regimen    - ABL anemia/Hemodynamics            stable                 - Medical issues              Renal failure, chonic foley cath                         new foley placed during this admission                          Appreciate Urology consult                          abx dc'd yesterday                          New cultures without growth                         Will need to follow up with [facility] in about 2 weeks               Hyponatremia                          improved                         dc IVF                DM                          A1C is good                          Continue with current tx                          CHO mod diet               Hypoxia                          CTA negative for PE  History sounds like undiagnosed OSA                         appreciate IM input                                      Will likely need outpt sleep study                                                        refused CPAP last night                Thyroid nodule                         nodule appears chronic                          Ultrasound ordered back in 2017 but not done                         Ultrasound done this am, await reading                 General wellness                         Establish primary care with community health [facility] wellness center                          Will need assistance with disability paperwork                         ? Component of depression given all of her life stressors                          outpt psychology eval if we can arrange transportation?                           Nutrition appears to be suboptimal- ? Lacks appropriate resources to feed self                                      Dietician consult                                      Added boost shakes TID    - DVT/PE prophylaxis:             Lovenox while inpatient              lovenox while at snf as well    - ID:              unasyn dc'd on 03/07/2017   - Activity:             PT/OT             ROM  R knee in hinged brace as needed    - FEN/GI prophylaxis/Foley/Lines:             CHO mod diet              NS    - Impediments to fracture healing:             DM              CKD    - Dispo:             continue with therapy              SNF today              Looking into additional resources to provide assistance                Follow up with ortho in 10 days or so              Follow up with urology in 2 weeks              Establish care with PCP              Apply for disability    Disposition: SNF  Discharge Instructions    Call MD / Call 911   Complete by:  As directed    If you experience chest pain or shortness of breath, CALL 911 and be transported to the hospital emergency room.  If you develope a fever above 101 F, pus (white drainage) or increased drainage or redness at the wound, or calf pain, call your surgeon's office.   Constipation Prevention   Complete by:  As directed    Drink plenty of fluids.  Prune juice may be helpful.  You may use a stool softener, such as Colace (over the counter) 100 mg twice a day.  Use MiraLax (over the counter) for constipation as needed.   Diet Carb Modified   Complete by:  As directed    Discharge instructions   Complete by:  As directed    Orthopaedic Trauma Service Discharge Instructions   General Discharge Instructions  WEIGHT BEARING STATUS: Nonweightbearing R leg   RANGE OF MOTION/ACTIVITY: gentle range of motion of R knee in brace. Keep hinged brace unlocked   Wound Care: remove compressive sleeve daily to evaluate skin. Ok to shower without brace or sleeve on. Have patient bathe in shower chair   DVT/PE prophylaxis: Lovenox 40 mg sq injection daily x 21 days  Diet: Carb mod diet            Can use over the counter stool softeners and bowel preparations, such as Miralax, to help with bowel movements.  Narcotics can be constipating.  Be sure to drink plenty of fluids  PAIN MEDICATION USE AND EXPECTATIONS  You have likely been given narcotic medications to help control your pain.  After a traumatic event that results in an fracture (broken bone) with or without surgery, it is ok to use narcotic pain medications to help control one's pain.  We understand that everyone responds to pain differently and each individual patient will be evaluated on a regular basis for the continued need for narcotic medications. Ideally, narcotic medication use should last no more than 6-8 weeks (coinciding with fracture healing).   As a patient it is your responsibility as well to monitor narcotic medication use and report the amount and frequency you use these medications when you come to  your office visit.   We would also advise that if you are using narcotic medications, you should take a dose prior to therapy to maximize you participation.  IF YOU ARE ON NARCOTIC MEDICATIONS IT IS NOT PERMISSIBLE TO OPERATE A MOTOR VEHICLE (MOTORCYCLE/CAR/TRUCK/MOPED) OR HEAVY MACHINERY DO NOT MIX NARCOTICS WITH OTHER CNS (CENTRAL NERVOUS SYSTEM) DEPRESSANTS SUCH AS ALCOHOL   STOP SMOKING OR USING NICOTINE PRODUCTS!!!!  As discussed nicotine severely impairs your body's ability to heal surgical and traumatic wounds but also impairs bone healing.  Wounds and bone heal by forming microscopic blood vessels (angiogenesis) and nicotine is a vasoconstrictor (essentially, shrinks blood vessels).  Therefore, if vasoconstriction occurs to these microscopic blood vessels they essentially disappear and are unable to deliver necessary nutrients to the healing tissue.  This is one modifiable factor that you can do to dramatically increase your chances of healing your injury.    (This means no smoking, no nicotine gum, patches,  etc)  DO NOT USE NONSTEROIDAL ANTI-INFLAMMATORY DRUGS (NSAID'S)  Using products such as Advil (ibuprofen), Aleve (naproxen), Motrin (ibuprofen) for additional pain control during fracture healing can delay and/or prevent the healing response.  If you would like to take over the counter (OTC) medication, Tylenol (acetaminophen) is ok.  However, some narcotic medications that are given for pain control contain acetaminophen as well. Therefore, you should not exceed more than 4000 mg of tylenol in a day if you do not have liver disease.  Also note that there are may OTC medicines, such as cold medicines and allergy medicines that my contain tylenol as well.  If you have any questions about medications and/or interactions please ask your doctor/PA or your pharmacist.      ICE AND ELEVATE INJURED/OPERATIVE EXTREMITY  Using ice and elevating the injured extremity above your heart can help with swelling and pain control.  Icing in a pulsatile fashion, such as 20 minutes on and 20 minutes off, can be followed.    Do not place ice directly on skin. Make sure there is a barrier between to skin and the ice pack.    Using frozen items such as frozen peas works well as the conform nicely to the are that needs to be iced.  USE AN ACE WRAP OR TED HOSE FOR SWELLING CONTROL  In addition to icing and elevation, Ace wraps or TED hose are used to help limit and resolve swelling.  It is recommended to use Ace wraps or TED hose until you are informed to stop.    When using Ace Wraps start the wrapping distally (farthest away from the body) and wrap proximally (closer to the body)   Example: If you had surgery on your leg or thing and you do not have a splint on, start the ace wrap at the toes and work your way up to the thigh        If you had surgery on your upper extremity and do not have a splint on, start the ace wrap at your fingers and work your way up to the upper arm  IF YOU ARE IN A SPLINT OR CAST DO NOT REMOVE  IT FOR ANY REASON   If your splint gets wet for any reason please contact the office immediately. You may shower in your splint or cast as long as you keep it dry.  This can be done by wrapping in a cast cover or garbage back (or similar)  Do Not stick any thing down your splint or  cast such as pencils, money, or hangers to try and scratch yourself with.  If you feel itchy take benadryl as prescribed on the bottle for itching  IF YOU ARE IN A CAM BOOT (BLACK BOOT)  You may remove boot periodically. Perform daily dressing changes as noted below.  Wash the liner of the boot regularly and wear a sock when wearing the boot. It is recommended that you sleep in the boot until told otherwise  CALL THE OFFICE WITH ANY QUESTIONS OR CONCERNS: [phone]   Do not put a pillow under the knee. Place it under the heel.   Complete by:  As directed    Place pillow under R BKA stump   Increase activity slowly as tolerated   Complete by:  As directed    Non weight bearing   Complete by:  As directed    Laterality:  right   Extremity:  Lower     Allergies as of 03/08/2017   No Known Allergies     Medication List    STOP taking these medications   acetaminophen 500 MG tablet Commonly known as:  TYLENOL   naproxen sodium 220 MG tablet Commonly known as:  ALEVE   prochlorperazine 5 MG tablet Commonly known as:  COMPAZINE     TAKE these medications   aspirin EC 81 MG tablet Take 81 mg by mouth daily as needed (chest pain).   calcium carbonate 500 MG chewable tablet Commonly known as:  TUMS - dosed in mg elemental calcium Chew 2 tablets (400 mg of elemental calcium total) by mouth 3 (three) times daily.   Cholecalciferol 5000 units Tabs Take 1 tablet (5,000 Units total) by mouth daily.   CLEAR EYES OP Place 1 drop into both eyes daily as needed (dry eyes/itching).   enoxaparin 40 MG/0.4ML injection Commonly known as:  LOVENOX Inject 0.4 mLs (40 mg total) into the skin daily.   feeding  supplement (BOOST BREEZE) Liqd Take 1 Bottle by mouth 3 (three) times daily with meals.   HYDROcodone-acetaminophen 5-325 MG tablet Commonly known as:  NORCO/VICODIN Take 1 tablet by mouth every 6 (six) hours as needed for moderate pain.   ICY HOT EX Apply 1 application topically 2 (two) times daily as needed (pain).   metFORMIN 500 MG tablet Commonly known as:  GLUCOPHAGE Take 1 tablet (500 mg total) by mouth daily with breakfast.            Discharge Care Instructions  (From admission, onward)        Start     Ordered   03/08/17 0000  Non weight bearing    Question Answer Comment  Laterality right   Extremity Lower      03/08/17 1033     Follow-up Information    [facility]. Go to.   Why:  Your appointment is scheduled for Thursday, March 31, 2017 at 10:30am Contact information: [place] [address] [phone]       Roby Lofts, MD. Schedule an appointment as soon as possible for a visit in 10 day(s).   Specialty:  Orthopedic Surgery Contact information: [address] [place] [address] [place] [address] [phone]        [facility]. Schedule an appointment as soon as possible for a visit in 2 week(s).   Why:  follow up for chronic urinary retention with indwelling catheter  Contact information: [address] [place]  [address] [phone]          Discharge Instructions and Plan:  52 y/o black female with numerous medical comorbidities with R BKA and acute R distal femur fracture after fall out of wheelchair, same foley catheter has been in place >1 year. Has not been on any medications for >1 year for DM     -fall    - minimally displaced R distal femur fracture, R BKA             Non-op tx             NWB R leg             Hinged brace, unlocked for full knee ROM                         Gentle PROM/AROM ok              Ice  PRN             Continue with compressive sleeve/shrinker. Can remove as needed             Ok to shower with brace and sleeve off. Would recommend bathing in shower chair                Pt never learned how to use prosthesis after BKA in 2013. She has been WC dependent since then                  There are extensive social issues complicating pts clinical picture                          Chronic medical conditions                         Death of her husband                         Children in foster care                         House going into or currently in foreclosure                          EtOH use?                         Lack of understanding on how to navigate application process for disability    - Pain management:             continue with current regimen    - ABL anemia/Hemodynamics            stable                 - Medical issues              Renal failure, chonic foley cath                         new foley placed during this admission                          Appreciate Urology consult  abx dc'd yesterday                          New cultures without growth                         Will need to [facility] in about 2 weeks               Hyponatremia                          improved                         dc IVF                DM                          A1C is good                          Continue with current tx                          CHO mod diet               Hypoxia                          CTA negative for PE                         History sounds like undiagnosed OSA                         appreciate IM input                                      Will likely need outpt sleep study                                                        refused CPAP last night                Thyroid nodule                         nodule appears chronic                          Ultrasound ordered back in 2017 but  not done                         Ultrasound done this am, await reading                 General wellness                         Establish primary care with community health and wellness center  Will need assistance with disability paperwork                         ? Component of depression given all of her life stressors                          outpt psychology eval if we can arrange transportation?                           Nutrition appears to be suboptimal- ? Lacks appropriate resources to feed self                                      Dietician consult                                     Added boost shakes TID    - DVT/PE prophylaxis:             Lovenox while inpatient              lovenox while at snf as well    - ID:              unasyn dc'd on 03/07/2017   - Activity:             PT/OT             ROM R knee in hinged brace as needed    - FEN/GI prophylaxis/Foley/Lines:             CHO mod diet              NS    - Impediments to fracture healing:             DM              CKD    - Dispo:             continue with therapy              SNF today              Looking into additional resources to provide assistance                Follow up with ortho in 10 days or so              Follow up with urology in 2 weeks              Establish care with PCP              Apply for disability   Signed:  Mearl Latin, PA-C [facility] [phone] (P) 03/08/2017, 11:08 AM

## 2017-03-08 NOTE — Social Work (Signed)
[facility] and [facility] have agreed to offer a SNF bed for patient. SNF hospital liaison will meet with patient today.  CSW will f/u for disposition today.  Keene BreathPatricia Contrina Orona, LCSW Clinical Social Worker [phone]

## 2017-03-08 NOTE — Progress Notes (Signed)
Patient seen and examined No distress Tells me is a diabetic but cannot afford meds-suggest metformin 500 daily to start and titrate as OP She tells me she was not offered cpap trial last pm=-suggest d/c on 2 liters oxygen hs to snf when eventually d/c Lastly needs US biopsy thyroid as OP--TSH is low for some reason no T4 performed, not emergent  No further recs'--she doesn't wish to leave today and i have explained to hwer that she doesnt really meet hopsitalization criterion.  I have discussed some of this' plan with Mr. Renae Fickleaul of orthopedics  Signing off-call if q [phone]  Pleas KochJai Jyren Cerasoli, MD [facility] [phone]

## 2017-03-31 ENCOUNTER — Ambulatory Visit (INDEPENDENT_AMBULATORY_CARE_PROVIDER_SITE_OTHER): Payer: Self-pay | Admitting: Physician Assistant

## 2017-03-31 ENCOUNTER — Encounter (INDEPENDENT_AMBULATORY_CARE_PROVIDER_SITE_OTHER): Payer: Self-pay | Admitting: Physician Assistant

## 2017-03-31 VITALS — BP 159/78 | HR 85 | Temp 98.2°F | Ht 67.0 in

## 2017-03-31 DIAGNOSIS — Z09 Encounter for follow-up examination after completed treatment for conditions other than malignant neoplasm: Secondary | ICD-10-CM

## 2017-03-31 DIAGNOSIS — E1159 Type 2 diabetes mellitus with other circulatory complications: Secondary | ICD-10-CM

## 2017-03-31 DIAGNOSIS — D649 Anemia, unspecified: Secondary | ICD-10-CM

## 2017-03-31 DIAGNOSIS — S72402A Unspecified fracture of lower end of left femur, initial encounter for closed fracture: Secondary | ICD-10-CM

## 2017-03-31 DIAGNOSIS — R339 Retention of urine, unspecified: Secondary | ICD-10-CM

## 2017-03-31 NOTE — Patient Instructions (Signed)
Community Resources  Advocacy/Legal Legal Aid [place]:  1-866-219-5262  /  336-272-0148  Family Justice Center:  336-641-7233  [facility] [place] 24-hr Crisis line:  336-273-7273  Women's Resource Center, [place]:  336-275-6090  Court Watch (custody):  336-275-2346  [place] [facility]:   336-279-9299    Baby & Breastfeeding Car Seat Inspection @ Various [place] Fire Depts.- call 336-373-2177  [place] [facility]  336-832-6860  [facility] 336-878-6712  WIC: 336-641-3663 ([place]);  336-641-7571 ([place])  La Leche League:  1-877-452-5321   Childcare [place] [facility] Development: 336-369-5097 ([place]) / 336-887-8224 ([place])  - Child Care Resources/ Referrals/ Scholarships  - Head Start/ Early Head Start (call or apply online)  [place] DHHS: [place] Pre-K :  1-800-859-0829 / 336-274-5437   Employment / Job Search [facility] [place]: 336-275-6090 / [address]  [employer] (JobLink): 336-373-5922 ([place]) / 336-882-4141 ([place])  [facility] Resource/ Career Center: 336-275-9801 / 336-282-7307  [facility] Job & Career Center: 336-373-3764  DHHS Work First: 336-641-3447 ([place]) / 336-641-3447 ([place])  [employer] [facility] [place]:  336-676-5871   Financial Assistance [place] [facility]:  336-553-2657  [employer]: 336-235-0368  [employer] Network (furniture):  336-370-4002  [place] Helping Hands: 336-373-4264  Low Income Energy Assistance  336-641-3000   Food Assistance DHHS- SNAP/ Food Stamps: 336-641-4588  WIC: [place]- 336-641-3663 ;  [place] 336-641-7571  Little Green Book- Free Meals  Little Blue Book- Free Food Pantries  During the summer, text "FOOD" to 877877   General Health / Clinics (Adults) Orange Card (for Adults) through [place] [facility]: (336) 895-4900  [facility]:   336-832-8035  [facility]:   336-832-4444  Health Department:  336-641-3245  [facility]:  336-415-3877 / 336-641-2100  Planned Parenthood of [place]:   336-373-0678  [facility]:   336-334-4822 x 50[address]   Housing [place] Housing Coalition:   336-691-9521  [place] Housing Authority:  336-275-8501  Affordable Housing Managemnt:  336-273-0568   Immigrant/ Refugee Center for [place]ians (UNCG):  336-256-1065  Faith Action International [facility]:  336-379-0037  [employer] Institute:  336-937-4701  Church World Services:  336-617-0381  African Services Coalition:  336-574-2677   LGBTQ YouthSAFE  www.youthsafe[place].org  PFLAG  336-541-6754 / info@[place].org  The Trevor Project:  1-866-488-7386   Mental Health/ Substance Use [facility]  336-387-6161  [facility]:  336-832-9700 or 1-800-711-2635  Carter's Circle of Care:  336-271-5888  [facility]:  336-294-1349  [facility]:  336-542-2884  [facility] (walk-ins)  336-676-6840 / [address]  [place]:  800-449-1287  Alcoholics Anonymous:  336-854-4278  Narcotics Anonymous:  800-365-1036  Quit Smoking Hotline:  800-QUIT-NOW (800-784-8669)   Parenting Children's Home Society:  800-632-1400  [place]: Education Center & Support Groups:  336-832-6682  YWCA: 336-273-3461  UNCG: Bringing Out the Best:  336-334-3120               Thriving at Three (Hispanic families): 336-256-1066  Healthy Start ([facility]):  336-387-6161 x2288  Parents as Teachers:  336-691-0024  [place] Child Development- Learning Together (Immigrants): 336-369-5001   Poison Control 800-222-1222  Sports & Recreation [employer] Open Doors Application: ymcanwnc.org/join/open-doors-financial-assistance/  City of [place] Recreation Centers: http://www.[place].gov/index.aspx?page=3615   Special Needs Family Support Network:  336-832-6507  Autism Society of [place]:   336-333-0197 x1402 or x1412 /  800-785-1035  TEACCH :  336-334-5773     ARC of [place]:  336-373-1076  Children's Developmental Service Agency (CDSA):  336-334-5601  CC4C (Care Coordination for Children):  336-641-7641   Transportation Medicaid Transportation: 336-641-4848 to apply  [place] Transit Authority: 336-335-6499 (reduced-fare bus ID to Medicaid/ Medicare/ Orange Card)  SCAT Paratransit services: Eligible riders only, call 336-333-6589 for application   Tutoring/Mentoring Black Child Development Institute: 336-230-2138  Big Brothers/ Big Sisters: 336-378-9100 ([place])  336-882-4167 ([place])  ACES through child's school: 336-370-2321  [employer] Achievers: contact your local Y  SHIELD Mentor Program: 336-337-2771   

## 2017-03-31 NOTE — Progress Notes (Signed)
Subjective:  Patient ID: [name], female    DOB: 1965-06-23  Age: 52 y.o. MRN: [id number]  CC: hospital f/u  HPI [name] is a 52 y.o. female with a medical history of anemia, arthritis, DM2, HTN, Intracerebral hemorrhage, stroke, right BKA 2013, cataracts, and urinary retention presents as a new patient on hospital f/u for femur fracture. Admitted 03/02/17 and discharged 03/08/17. Had a ground level fall and found to have a nondisplaced right distal femur fracture. Blood transfusion performed due to Hgb 7.3 g/dL.  Non-operative treatment was opted due to her overall condition and was fitted for a hinged knee brace. She was subsequently released to [facility] skilled nursing center. Was to f/u with orthopedics and reports having gone to [facility] with Dr. Jena Gauss. Next appointment is 04/11/17 at 1015 hrs. Plans to go by taxi cab. Wears brace as directed. There is sudden sharp and throbbing pain at the stump and not at the region of fracture.     Was noted to have a chronic indwelling catheter that pt had not changed since 11/2015. Pt treated with abx and urine cx ultimately revealed no growth. She saw the urologist two days ago. She was started on Tamsulosin 0.4 mg and has thus far taken one pill with no noticeable result except for maybe somnolence. Does not endorse any other symptoms or complaints.      Outpatient Medications Prior to Visit  Medication Sig Dispense Refill  . aspirin EC 81 MG tablet Take 81 mg by mouth daily as needed (chest pain).    . calcium carbonate (TUMS - DOSED IN MG ELEMENTAL CALCIUM) 500 MG chewable tablet Chew 2 tablets (400 mg of elemental calcium total) by mouth 3 (three) times daily. 180 tablet 0  . cholecalciferol 5000 units TABS Take 1 tablet (5,000 Units total) by mouth daily.    Marland Kitchen enoxaparin (LOVENOX) 40 MG/0.4ML injection Inject 0.4 mLs (40 mg total) into the skin daily. 21 Syringe 0  . HYDROcodone-acetaminophen  (NORCO/VICODIN) 5-325 MG tablet Take 1 tablet by mouth every 6 (six) hours as needed for moderate pain. 30 tablet 0  . Menthol, Topical Analgesic, (ICY HOT EX) Apply 1 application topically 2 (two) times daily as needed (pain).    . metFORMIN (GLUCOPHAGE) 500 MG tablet Take 1 tablet (500 mg total) by mouth daily with breakfast. 60 tablet 11  . Naphazoline HCl (CLEAR EYES OP) Place 1 drop into both eyes daily as needed (dry eyes/itching).    . Nutritional Supplements (FEEDING SUPPLEMENT, BOOST BREEZE,) LIQD Take 1 Bottle by mouth 3 (three) times daily with meals. (Patient not taking: Reported on 03/31/2017)  0   No facility-administered medications prior to visit.      ROS Review of Systems  Constitutional: Negative for chills, fever and malaise/fatigue.  Eyes: Negative for blurred vision.  Respiratory: Negative for shortness of breath.   Cardiovascular: Negative for chest pain and palpitations.  Gastrointestinal: Negative for abdominal pain and nausea.  Genitourinary: Negative for dysuria and hematuria.  Musculoskeletal: Negative for joint pain and myalgias.       Leg pain  Skin: Negative for rash.  Neurological: Negative for tingling and headaches.  Psychiatric/Behavioral: Negative for depression. The patient is not nervous/anxious.     Objective:  BP (!) 159/78 (BP Location: Left Arm, Patient Position: Sitting, Cuff Size: Large)   Pulse 85   Temp 98.2 F (36.8 C) (Oral)   Ht 5\' 7"  (1.702 m)   SpO2 93%  BMI 29.76 kg/m   BP/Weight 03/31/2017 03/08/2017 03/02/2017  Systolic BP 159 146 -  Diastolic BP 78 59 -  Wt. (Lbs) - - 190  BMI 29.76 - 29.76      Physical Exam  Constitutional: She is oriented to person, place, and time.  Well developed, overweight, NAD, polite, sitting in wheelchair, right BKA, wearing hinged knee brace.  HENT:  Head: Normocephalic and atraumatic.  Eyes: Conjunctivae are normal. No scleral icterus.  Neck: Normal range of motion. Neck supple. No  thyromegaly present.  Cardiovascular: Normal rate, regular rhythm and normal heart sounds.  Pulmonary/Chest: Effort normal and breath sounds normal. No respiratory distress. She has no wheezes.  Musculoskeletal: She exhibits no edema.  Right BKA  Neurological: She is alert and oriented to person, place, and time. No cranial nerve deficit. Coordination normal.  Skin: Skin is warm and dry. No rash noted. No erythema. No pallor.  Psychiatric: She has a normal mood and affect. Her behavior is normal. Thought content normal.  Vitals reviewed.    Assessment & Plan:   1. Hospital discharge follow-up - CBC with Differential  2. Closed fracture of distal end of left femur, unspecified fracture morphology, initial encounter (HCC) - CBC with Differential  3. Type 2 diabetes mellitus with other circulatory complication, without long-term current use of insulin (HCC) - Basic Metabolic Panel  4. Urinary retention - Being managed by urology with Tamsulosin 0.4 mg qday. No appreciable effect yet as tx has only started. Patient still wearing indwelling Foley cath.   5. Anemia, unspecified type - Likely attributed to femur fracture - CBC with Differential     Follow-up: Return in about 1 month (around 04/28/2017) for general health.   Loletta Specteroger David Vinod Mikesell PA

## 2017-04-01 ENCOUNTER — Telehealth (INDEPENDENT_AMBULATORY_CARE_PROVIDER_SITE_OTHER): Payer: Self-pay

## 2017-04-01 LAB — CBC WITH DIFFERENTIAL/PLATELET
Basophils Absolute: 0 10*3/uL (ref 0.0–0.2)
Basos: 0 %
EOS (ABSOLUTE): 0.2 10*3/uL (ref 0.0–0.4)
Eos: 3 %
Hematocrit: 27.6 % — ABNORMAL LOW (ref 34.0–46.6)
Hemoglobin: 9 g/dL — ABNORMAL LOW (ref 11.1–15.9)
Immature Grans (Abs): 0 10*3/uL (ref 0.0–0.1)
Immature Granulocytes: 0 %
Lymphocytes Absolute: 1.4 10*3/uL (ref 0.7–3.1)
Lymphs: 22 %
MCH: 29.1 pg (ref 26.6–33.0)
MCHC: 32.6 g/dL (ref 31.5–35.7)
MCV: 89 fL (ref 79–97)
Monocytes Absolute: 0.4 10*3/uL (ref 0.1–0.9)
Monocytes: 6 %
Neutrophils Absolute: 4.4 10*3/uL (ref 1.4–7.0)
Neutrophils: 69 %
Platelets: 117 10*3/uL — ABNORMAL LOW (ref 150–379)
RBC: 3.09 x10E6/uL — ABNORMAL LOW (ref 3.77–5.28)
RDW: 14.5 % (ref 12.3–15.4)
WBC: 6.4 10*3/uL (ref 3.4–10.8)

## 2017-04-01 LAB — BASIC METABOLIC PANEL
BUN/Creatinine Ratio: 11 (ref 9–23)
BUN: 14 mg/dL (ref 6–24)
CO2: 22 mmol/L (ref 20–29)
Calcium: 9.3 mg/dL (ref 8.7–10.2)
Chloride: 103 mmol/L (ref 96–106)
Creatinine, Ser: 1.25 mg/dL — ABNORMAL HIGH (ref 0.57–1.00)
GFR calc Af Amer: 58 mL/min/{1.73_m2} — ABNORMAL LOW (ref >59)
GFR calc non Af Amer: 50 mL/min/{1.73_m2} — ABNORMAL LOW (ref >59)
Glucose: 87 mg/dL (ref 65–99)
Potassium: 5.2 mmol/L (ref 3.5–5.2)
Sodium: 139 mmol/L (ref 134–144)

## 2017-04-01 NOTE — Telephone Encounter (Signed)
-----   Message from Loletta Specteroger David Gomez, PA-C sent at 04/01/2017  8:29 AM EDT ----- Anemia stable. This may likely be anemia of chronic disease. Keep f/u appointment.

## 2017-04-01 NOTE — Telephone Encounter (Signed)
Left patient a voicemail informing of stable anemia, likely anemia of chronic disease. Encouraged patient to please keep follow up appointment on 05/06/17 at 10:30 am, and to [facility] with any questions. [name] S [name], CMA

## 2017-05-06 ENCOUNTER — Ambulatory Visit (INDEPENDENT_AMBULATORY_CARE_PROVIDER_SITE_OTHER): Payer: Self-pay | Admitting: Physician Assistant

## 2017-06-06 IMAGING — CT CT PELVIS W/O CM
2 of 6 series · 16 of 46 positions shown, 18 images · non-contrast
Comparison: None.

CLINICAL DATA: Evaluate rectovaginal fistula.

EXAM:
CT PELVIS WITHOUT CONTRAST
TECHNIQUE: Multidetector CT imaging of the pelvis was performed following the
standard protocol without intravenous contrast.

[Series 4: pelvis w/ rectal 5.0 i31f 1 · axial · 0.97mm/px · z∈[-268,-8]mm · 13 of 60 slices shown, 15 images]
[im 4/60  soft-tissue]
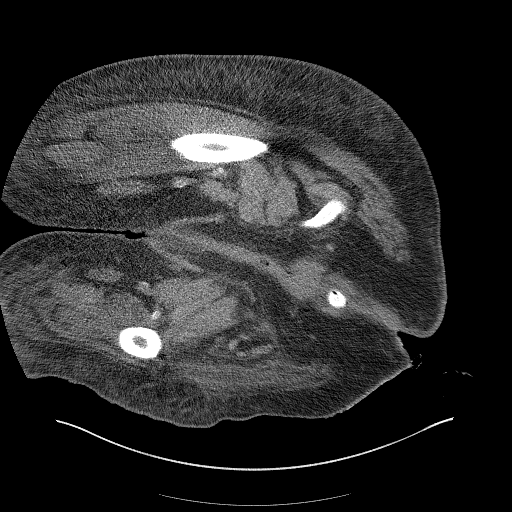
[im 4/60  bone]
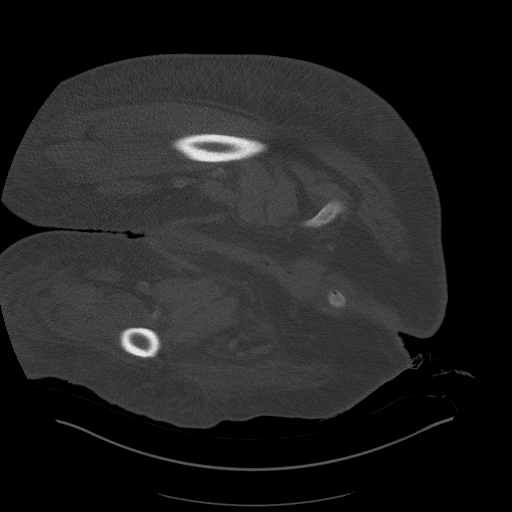
[im 8/60  soft-tissue]
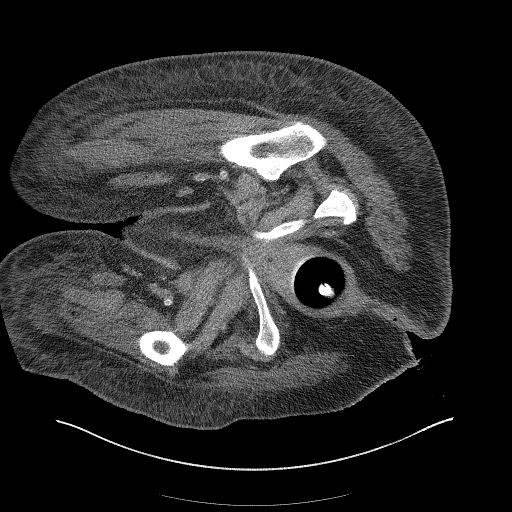
[im 12/60  soft-tissue]
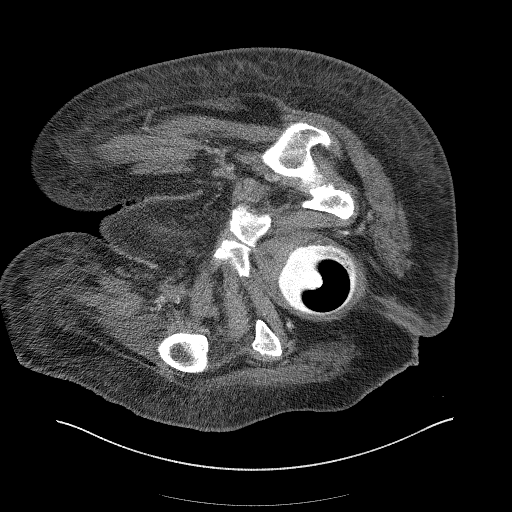
[im 16/60  soft-tissue]
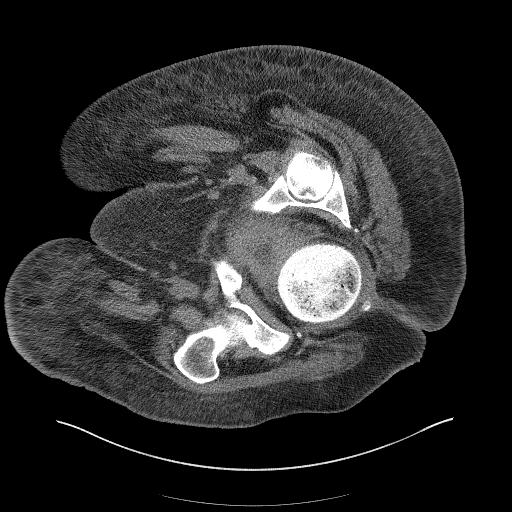
[im 20/60  soft-tissue]
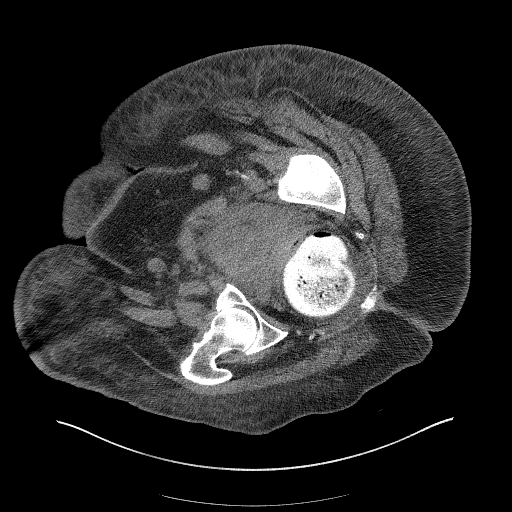
[im 24/60  soft-tissue]
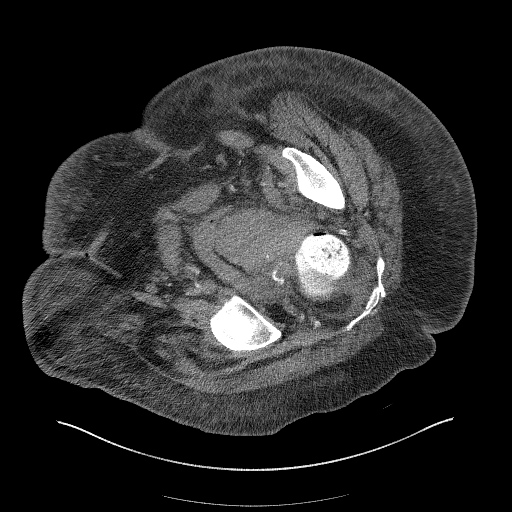
[im 32/60  soft-tissue]
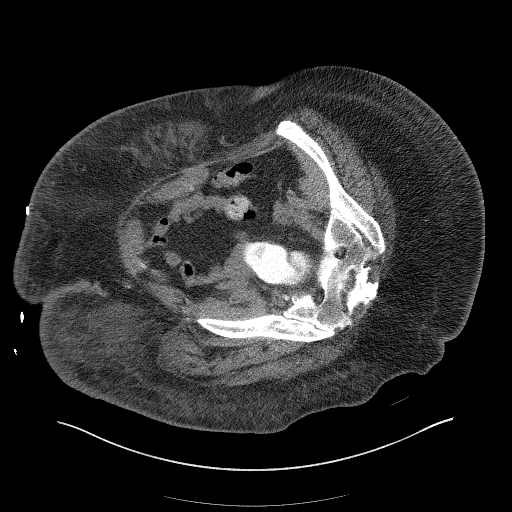
[im 36/60  soft-tissue]
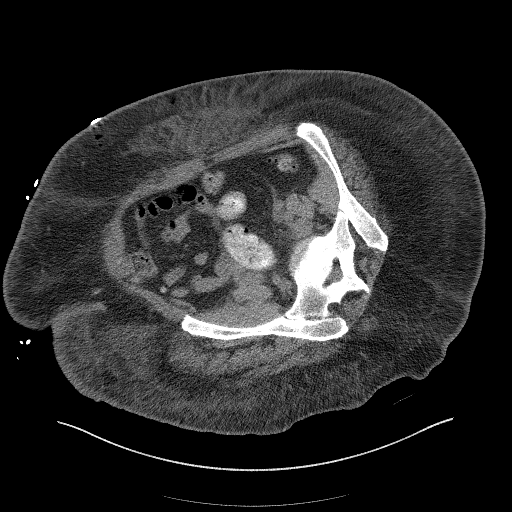
[im 40/60  soft-tissue]
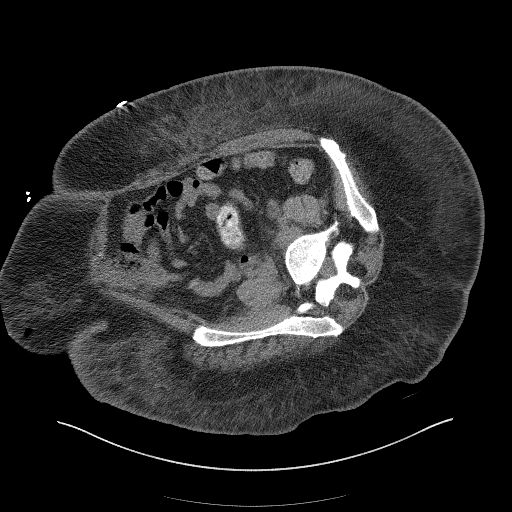
[im 40/60  bone]
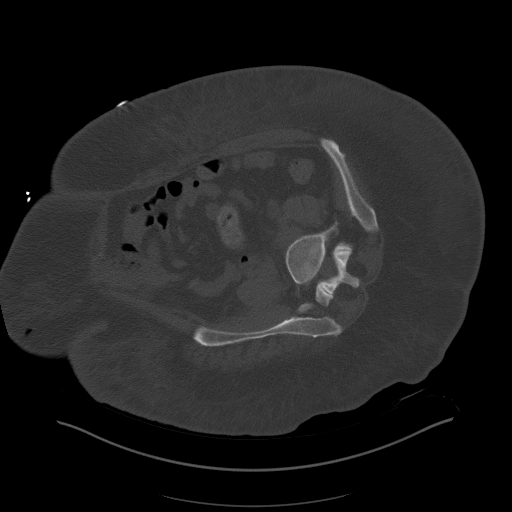
[im 44/60  soft-tissue]
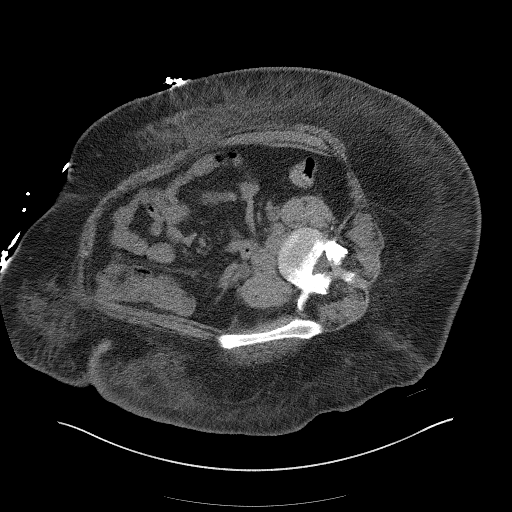
[im 48/60  soft-tissue]
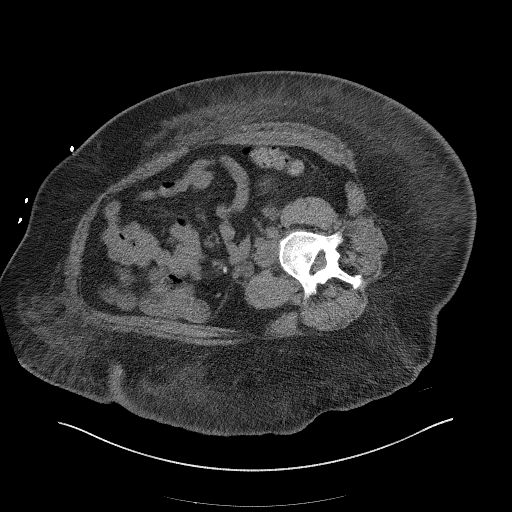
[im 52/60  soft-tissue]
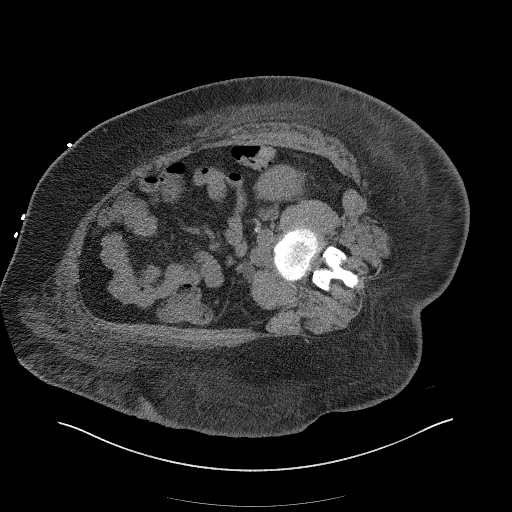
[im 56/60  soft-tissue]
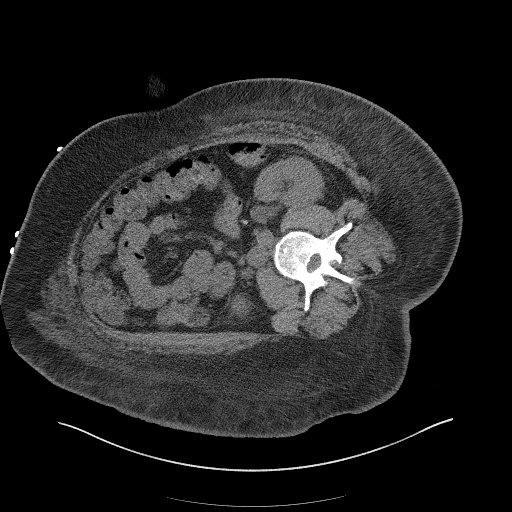

[Series 9: sagittal · coronal · 0.58mm/px · 3 of 131 slices shown]
[im 33/131  soft-tissue]
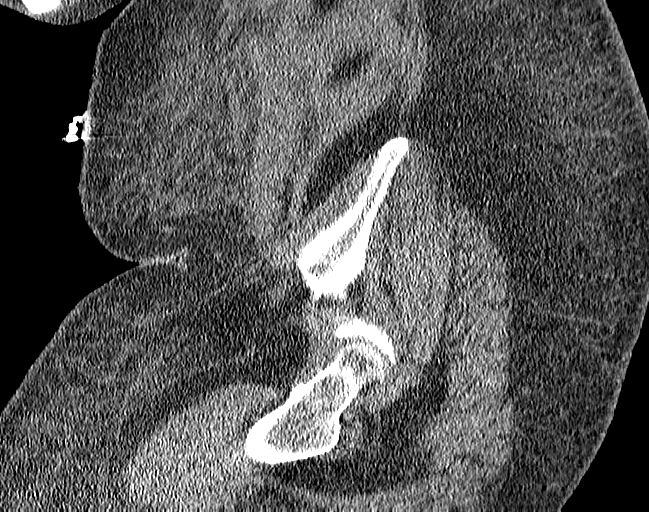
[im 66/131  soft-tissue]
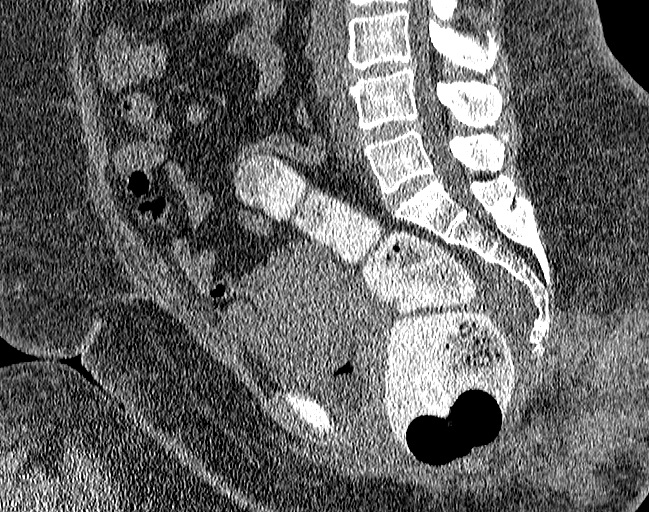
[im 98/131  soft-tissue]
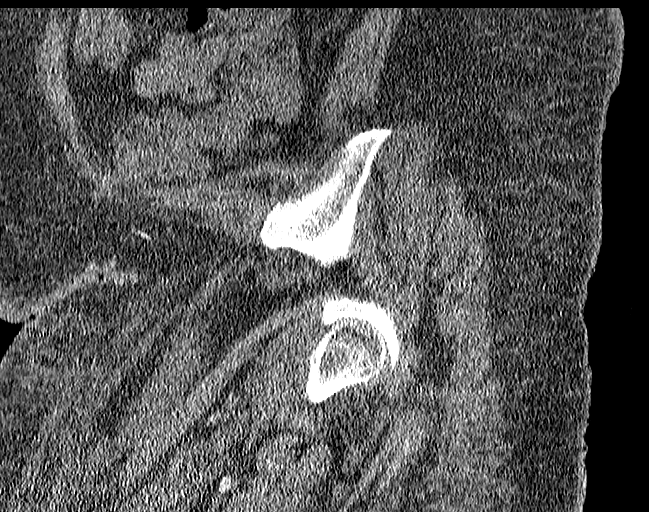

[16 of 46 positions shown; findings below may reference images not displayed]

FINDINGS: Radiopaque material has been instilled in a retrograde fashion into
the rectum and distal sigmoid colon. A balloon tipped catheter is
seen within the rectum. No extraluminal contrast opacification is
seen to suggest rectovaginal fistula. Foley catheter is in place.

There is partially visualized left hydronephrosis. There is
bilateral moderate hydroureter. The pelvic organs are poorly
visualized due to lack of IV contrast.

There is diffuse anasarca. Moderately enlarged bilateral inguinal
lymph nodes are seen.
IMPRESSION: No evidence of rectovaginal fistula.

Poor visualization of the pelvic organs due to lack of IV contrast.

Diffuse anasarca.

Bilateral hydroureter and partially visualized left hydronephrosis.

Moderately enlarged bilateral inguinal lymph nodes.

## 2017-12-07 IMAGING — US US RENAL
1 series · 14 of 25 positions shown · non-contrast
Comparison: CT of the abdomen pelvis 08/29/2015

CLINICAL DATA: Acute renal failure.

EXAM:
RENAL / URINARY TRACT ULTRASOUND COMPLETE

[Series 1: us renal · 0.25mm/px · 14 of 37 slices shown]
[im 1/37]
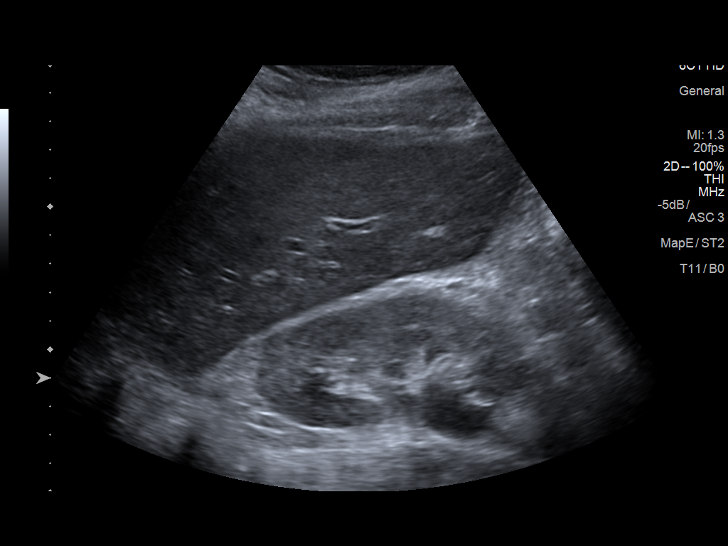
[im 4/37]
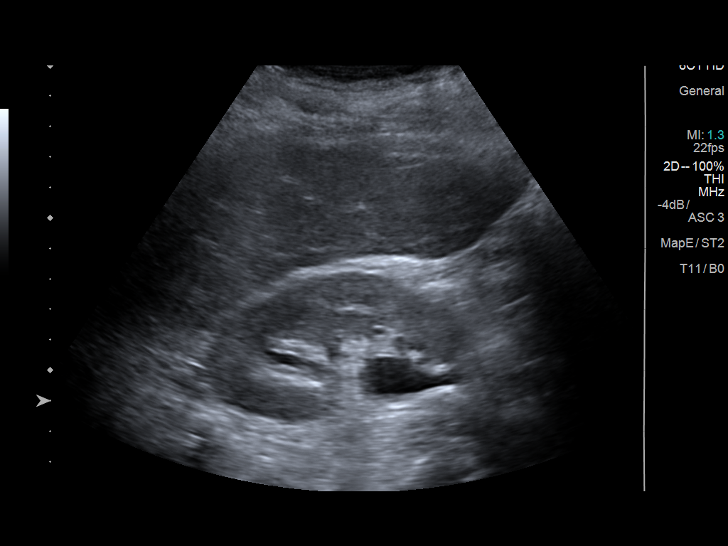
[im 7/37]
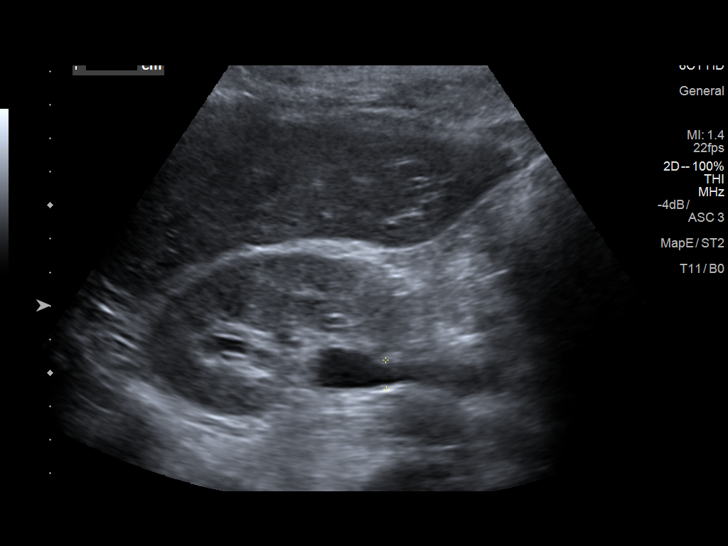
[im 10/37]
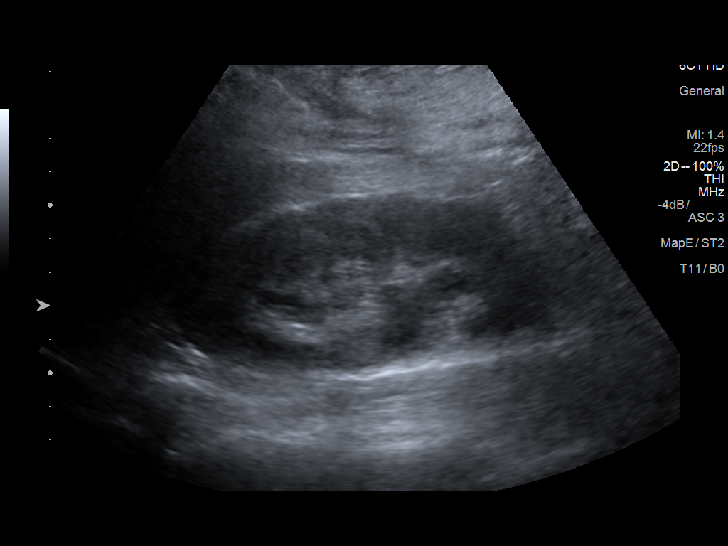
[im 13/37]
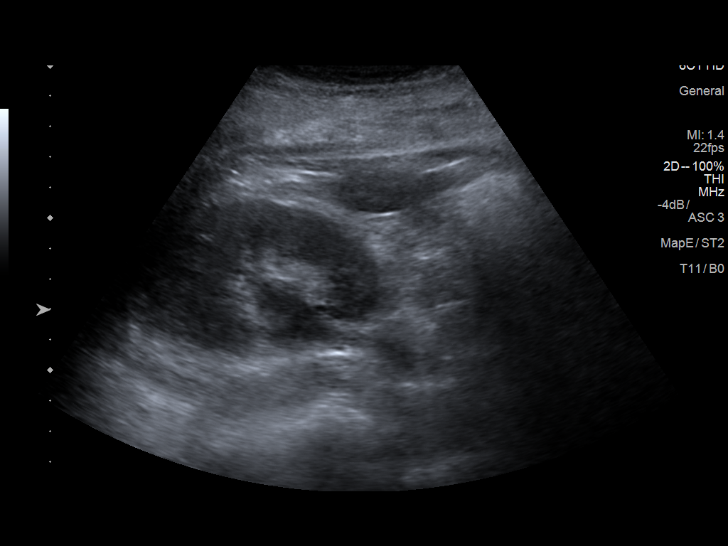
[im 14/37]
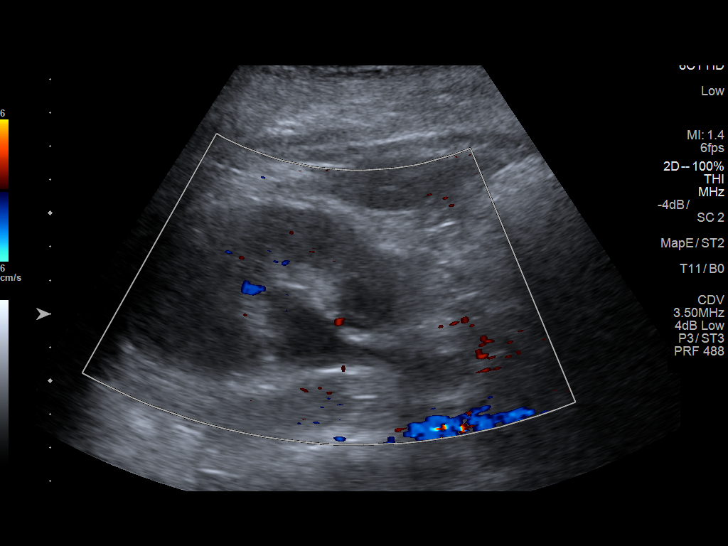
[im 17/37]
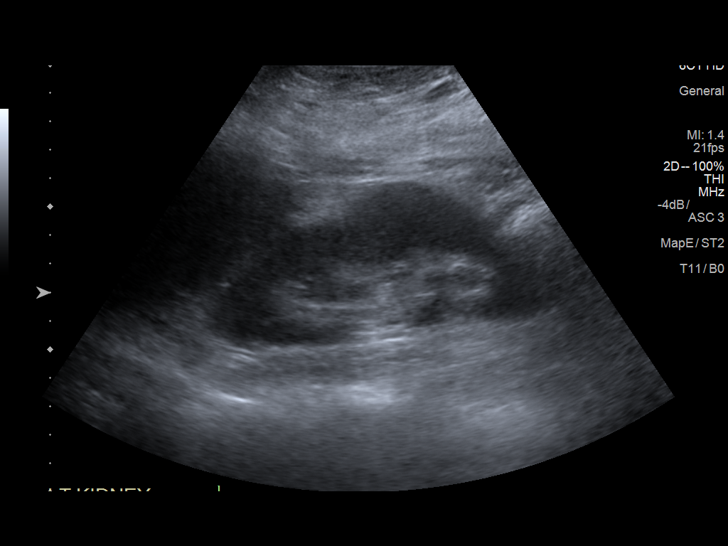
[im 20/37]
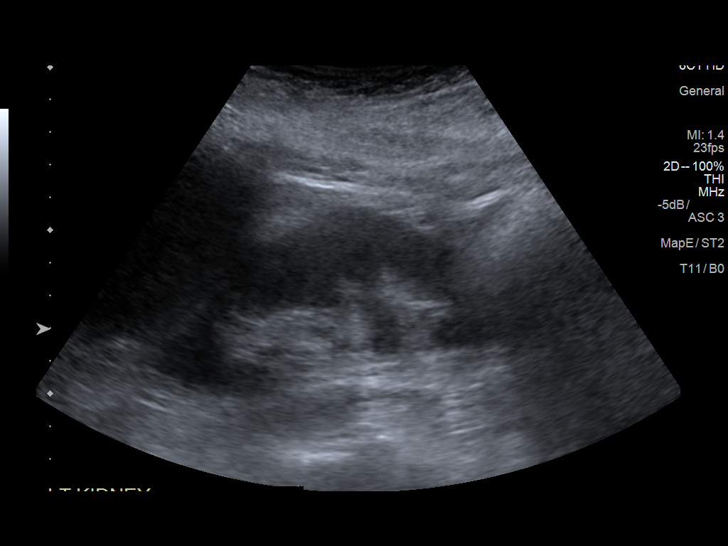
[im 23/37]
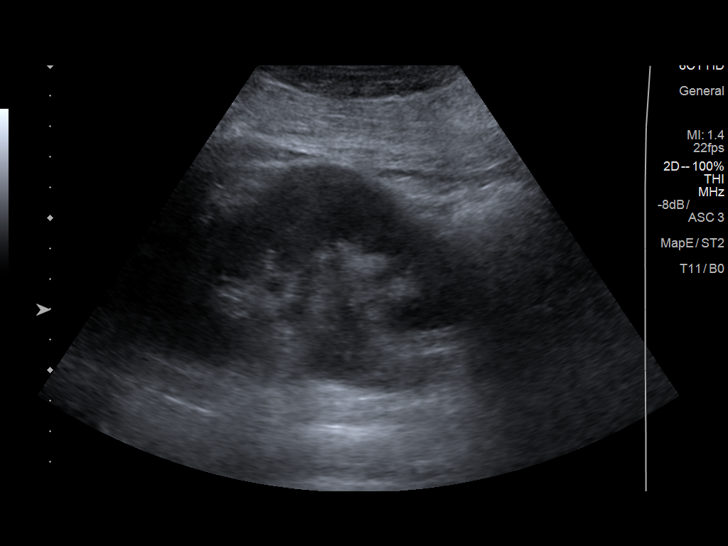
[im 25/37]
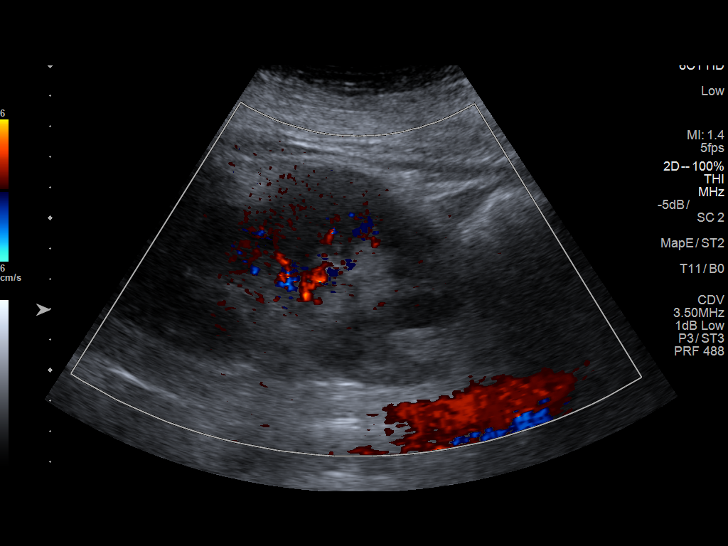
[im 28/37]
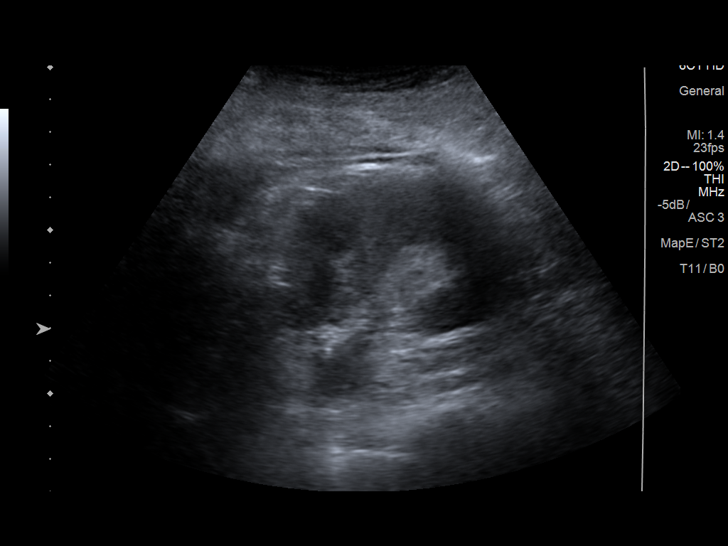
[im 31/37]
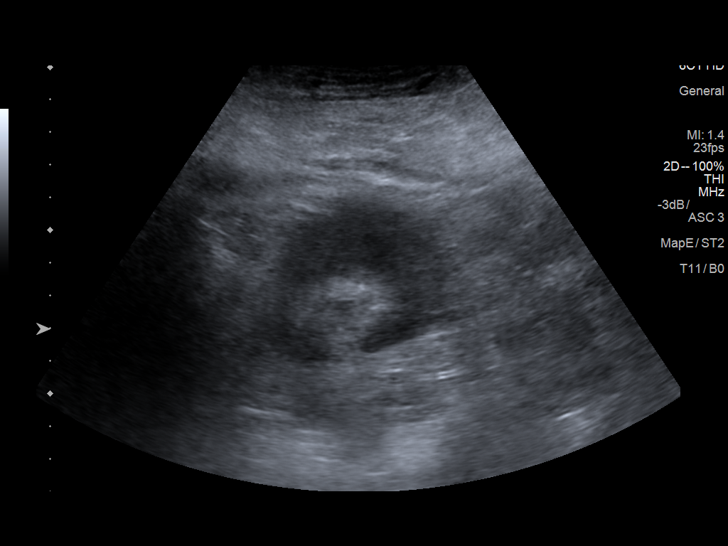
[im 34/37]
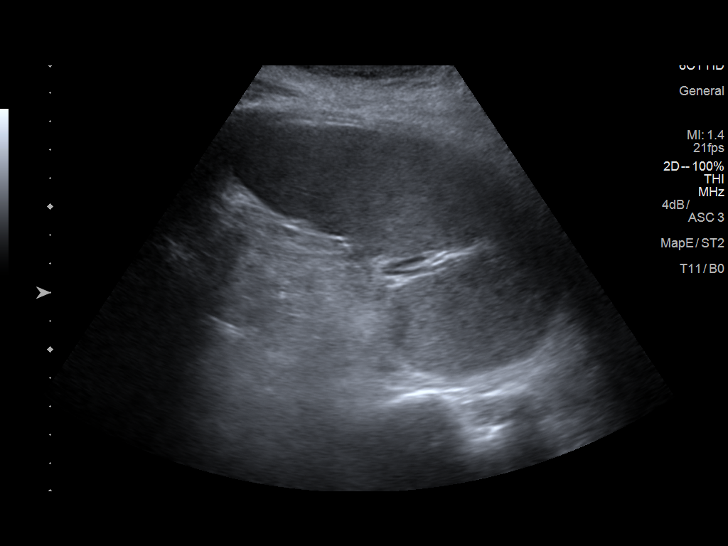
[im 37/37]
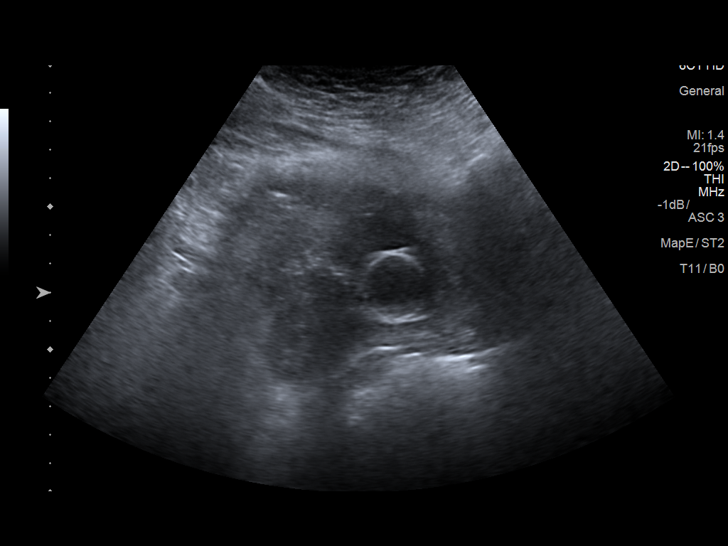

[14 of 25 positions shown; findings below may reference images not displayed]

FINDINGS: Right Kidney:

Length: 12.8 cm.  There is moderate hydronephrosis.

Left Kidney:

Length: 12.2 cm.  There is moderate hydronephrosis.

Bladder:

Urinary bladder is poorly visualized as it is decompressed around
urinary Foley catheter.
IMPRESSION: Bilateral moderate hydronephrosis without evidence of cortical
thinning.

## 2018-01-25 ENCOUNTER — Emergency Department (HOSPITAL_COMMUNITY): Payer: Medicaid Other

## 2018-01-25 ENCOUNTER — Other Ambulatory Visit: Payer: Self-pay

## 2018-01-25 ENCOUNTER — Inpatient Hospital Stay (HOSPITAL_COMMUNITY)
Admission: EM | Admit: 2018-01-25 | Discharge: 2018-01-30 | DRG: 074 | Disposition: A | Discharge status: Home / Self Care | Payer: Self-pay | Attending: Internal Medicine | Admitting: Internal Medicine

## 2018-01-25 DIAGNOSIS — E11621 Type 2 diabetes mellitus with foot ulcer: Secondary | ICD-10-CM | POA: Diagnosis present

## 2018-01-25 DIAGNOSIS — N183 Chronic kidney disease, stage 3 unspecified: Secondary | ICD-10-CM

## 2018-01-25 DIAGNOSIS — E119 Type 2 diabetes mellitus without complications: Secondary | ICD-10-CM

## 2018-01-25 DIAGNOSIS — Z993 Dependence on wheelchair: Secondary | ICD-10-CM

## 2018-01-25 DIAGNOSIS — L97429 Non-pressure chronic ulcer of left heel and midfoot with unspecified severity: Secondary | ICD-10-CM | POA: Diagnosis present

## 2018-01-25 DIAGNOSIS — Z7984 Long term (current) use of oral hypoglycemic drugs: Secondary | ICD-10-CM

## 2018-01-25 DIAGNOSIS — Z89511 Acquired absence of right leg below knee: Secondary | ICD-10-CM

## 2018-01-25 DIAGNOSIS — H9190 Unspecified hearing loss, unspecified ear: Secondary | ICD-10-CM | POA: Diagnosis present

## 2018-01-25 DIAGNOSIS — E1159 Type 2 diabetes mellitus with other circulatory complications: Secondary | ICD-10-CM

## 2018-01-25 DIAGNOSIS — N179 Acute kidney failure, unspecified: Secondary | ICD-10-CM | POA: Diagnosis present

## 2018-01-25 DIAGNOSIS — E875 Hyperkalemia: Secondary | ICD-10-CM | POA: Diagnosis present

## 2018-01-25 DIAGNOSIS — M19041 Primary osteoarthritis, right hand: Secondary | ICD-10-CM | POA: Diagnosis present

## 2018-01-25 DIAGNOSIS — Z8673 Personal history of transient ischemic attack (TIA), and cerebral infarction without residual deficits: Secondary | ICD-10-CM

## 2018-01-25 DIAGNOSIS — M479 Spondylosis, unspecified: Secondary | ICD-10-CM | POA: Diagnosis present

## 2018-01-25 DIAGNOSIS — E1122 Type 2 diabetes mellitus with diabetic chronic kidney disease: Secondary | ICD-10-CM | POA: Diagnosis present

## 2018-01-25 DIAGNOSIS — Z79899 Other long term (current) drug therapy: Secondary | ICD-10-CM

## 2018-01-25 DIAGNOSIS — D696 Thrombocytopenia, unspecified: Secondary | ICD-10-CM | POA: Diagnosis present

## 2018-01-25 DIAGNOSIS — E1140 Type 2 diabetes mellitus with diabetic neuropathy, unspecified: Principal | ICD-10-CM | POA: Diagnosis present

## 2018-01-25 DIAGNOSIS — E669 Obesity, unspecified: Secondary | ICD-10-CM | POA: Diagnosis present

## 2018-01-25 DIAGNOSIS — Z7982 Long term (current) use of aspirin: Secondary | ICD-10-CM

## 2018-01-25 DIAGNOSIS — D631 Anemia in chronic kidney disease: Secondary | ICD-10-CM | POA: Diagnosis present

## 2018-01-25 DIAGNOSIS — I129 Hypertensive chronic kidney disease with stage 1 through stage 4 chronic kidney disease, or unspecified chronic kidney disease: Secondary | ICD-10-CM | POA: Diagnosis present

## 2018-01-25 DIAGNOSIS — I10 Essential (primary) hypertension: Secondary | ICD-10-CM | POA: Diagnosis present

## 2018-01-25 DIAGNOSIS — L0390 Cellulitis, unspecified: Secondary | ICD-10-CM | POA: Diagnosis present

## 2018-01-25 DIAGNOSIS — M19042 Primary osteoarthritis, left hand: Secondary | ICD-10-CM | POA: Diagnosis present

## 2018-01-25 DIAGNOSIS — E1136 Type 2 diabetes mellitus with diabetic cataract: Secondary | ICD-10-CM | POA: Diagnosis present

## 2018-01-25 DIAGNOSIS — Z960 Presence of urogenital implants: Secondary | ICD-10-CM | POA: Diagnosis present

## 2018-01-25 DIAGNOSIS — L03116 Cellulitis of left lower limb: Secondary | ICD-10-CM | POA: Diagnosis present

## 2018-01-25 DIAGNOSIS — N182 Chronic kidney disease, stage 2 (mild): Secondary | ICD-10-CM | POA: Diagnosis present

## 2018-01-25 LAB — CBC WITH DIFFERENTIAL/PLATELET
Abs Immature Granulocytes: 0.04 10*3/uL (ref 0.00–0.07)
Basophils Absolute: 0 10*3/uL (ref 0.0–0.1)
Basophils Relative: 0 %
Eosinophils Absolute: 0.1 10*3/uL (ref 0.0–0.5)
Eosinophils Relative: 2 %
HCT: 35.8 % — ABNORMAL LOW (ref 36.0–46.0)
Hemoglobin: 11 g/dL — ABNORMAL LOW (ref 12.0–15.0)
Immature Granulocytes: 1 %
Lymphocytes Relative: 19 %
Lymphs Abs: 1.4 10*3/uL (ref 0.7–4.0)
MCH: 29.2 pg (ref 26.0–34.0)
MCHC: 30.7 g/dL (ref 30.0–36.0)
MCV: 95 fL (ref 80.0–100.0)
Monocytes Absolute: 0.7 10*3/uL (ref 0.1–1.0)
Monocytes Relative: 10 %
Neutro Abs: 5.1 10*3/uL (ref 1.7–7.7)
Neutrophils Relative %: 68 %
Platelets: UNDETERMINED 10*3/uL (ref 150–400)
RBC: 3.77 MIL/uL — ABNORMAL LOW (ref 3.87–5.11)
RDW: 12.8 % (ref 11.5–15.5)
WBC: 7.4 10*3/uL (ref 4.0–10.5)
nRBC: 0 % (ref 0.0–0.2)

## 2018-01-25 LAB — BASIC METABOLIC PANEL
Anion gap: 11 (ref 5–15)
BUN: 22 mg/dL — ABNORMAL HIGH (ref 6–20)
CO2: 18 mmol/L — ABNORMAL LOW (ref 22–32)
Calcium: 8.4 mg/dL — ABNORMAL LOW (ref 8.9–10.3)
Chloride: 101 mmol/L (ref 98–111)
Creatinine, Ser: 1.37 mg/dL — ABNORMAL HIGH (ref 0.44–1.00)
GFR calc Af Amer: 52 mL/min — ABNORMAL LOW (ref >60)
GFR calc non Af Amer: 45 mL/min — ABNORMAL LOW (ref >60)
Glucose, Bld: 174 mg/dL — ABNORMAL HIGH (ref 70–99)
Potassium: 5.5 mmol/L — ABNORMAL HIGH (ref 3.5–5.1)
Sodium: 130 mmol/L — ABNORMAL LOW (ref 135–145)

## 2018-01-25 LAB — CBG MONITORING, ED: Glucose-Capillary: 144 mg/dL — ABNORMAL HIGH (ref 70–99)

## 2018-01-25 LAB — HEMOGLOBIN A1C
Hgb A1c MFr Bld: 7.5 % — ABNORMAL HIGH (ref 4.8–5.6)
Mean Plasma Glucose: 168.55 mg/dL

## 2018-01-25 LAB — GLUCOSE, CAPILLARY: Glucose-Capillary: 126 mg/dL — ABNORMAL HIGH (ref 70–99)

## 2018-01-25 LAB — LACTIC ACID, PLASMA: Lactic Acid, Venous: 1.7 mmol/L (ref 0.5–1.9)

## 2018-01-25 MED ORDER — SODIUM CHLORIDE 0.9% FLUSH
3.0000 mL | Freq: Two times a day (BID) | INTRAVENOUS | Status: DC
  Administered 2018-01-25 – 2018-01-28 (×5): 3 mL via INTRAVENOUS

## 2018-01-25 MED ORDER — HYDROCODONE-ACETAMINOPHEN 5-325 MG PO TABS
1.0000 | ORAL_TABLET | Freq: Four times a day (QID) | ORAL | Status: DC | PRN
  Administered 2018-01-26 – 2018-01-29 (×6): 1 via ORAL
  Filled 2018-01-25 (×7): qty 1

## 2018-01-25 MED ORDER — ACETAMINOPHEN 325 MG PO TABS
650.0000 mg | ORAL_TABLET | Freq: Four times a day (QID) | ORAL | Status: DC | PRN
  Administered 2018-01-26: 650 mg via ORAL
  Filled 2018-01-25: qty 2

## 2018-01-25 MED ORDER — SODIUM CHLORIDE 0.9 % IV SOLN
1.0000 g | INTRAVENOUS | Status: DC
  Administered 2018-01-26 – 2018-01-27 (×2): 1 g via INTRAVENOUS
  Filled 2018-01-25 (×2): qty 10

## 2018-01-25 MED ORDER — VANCOMYCIN HCL IN DEXTROSE 1-5 GM/200ML-% IV SOLN: 1000.0000 mg | INTRAVENOUS | Status: DC

## 2018-01-25 MED ORDER — ACETAMINOPHEN 650 MG RE SUPP: 650.0000 mg | Freq: Four times a day (QID) | RECTAL | Status: DC | PRN

## 2018-01-25 MED ORDER — GABAPENTIN 400 MG PO CAPS: 400.0000 mg | ORAL_CAPSULE | Freq: Every day | ORAL | Status: DC | PRN

## 2018-01-25 MED ORDER — SODIUM CHLORIDE 0.9 % IV SOLN
2.0000 g | Freq: Three times a day (TID) | INTRAVENOUS | Status: DC
  Administered 2018-01-25: 2 g via INTRAVENOUS
  Filled 2018-01-25: qty 2

## 2018-01-25 MED ORDER — GABAPENTIN 400 MG PO CAPS
400.0000 mg | ORAL_CAPSULE | Freq: Two times a day (BID) | ORAL | Status: DC
  Administered 2018-01-25 – 2018-01-30 (×10): 400 mg via ORAL
  Filled 2018-01-25 (×10): qty 1

## 2018-01-25 MED ORDER — HEPARIN SODIUM (PORCINE) 5000 UNIT/ML IJ SOLN
5000.0000 [IU] | Freq: Three times a day (TID) | INTRAMUSCULAR | Status: DC
  Administered 2018-01-25 – 2018-01-30 (×15): 5000 [IU] via SUBCUTANEOUS
  Filled 2018-01-25 (×15): qty 1

## 2018-01-25 MED ORDER — METRONIDAZOLE IN NACL 5-0.79 MG/ML-% IV SOLN
500.0000 mg | Freq: Once | INTRAVENOUS | Status: DC
  Administered 2018-01-25: 500 mg via INTRAVENOUS
  Filled 2018-01-25: qty 100

## 2018-01-25 MED ORDER — SODIUM CHLORIDE 0.9 % IV SOLN
INTRAVENOUS | Status: AC
  Administered 2018-01-25 – 2018-01-26 (×2): via INTRAVENOUS

## 2018-01-25 MED ORDER — VANCOMYCIN HCL 10 G IV SOLR
1500.0000 mg | Freq: Once | INTRAVENOUS | Status: DC
  Filled 2018-01-25: qty 1500

## 2018-01-25 MED ORDER — INSULIN ASPART 100 UNIT/ML ~~LOC~~ SOLN
0.0000 [IU] | Freq: Three times a day (TID) | SUBCUTANEOUS | Status: DC
  Administered 2018-01-26 – 2018-01-27 (×4): 2 [IU] via SUBCUTANEOUS
  Administered 2018-01-27: 3 [IU] via SUBCUTANEOUS
  Administered 2018-01-27 – 2018-01-28 (×2): 2 [IU] via SUBCUTANEOUS
  Administered 2018-01-28: 3 [IU] via SUBCUTANEOUS
  Administered 2018-01-28 – 2018-01-29 (×3): 1 [IU] via SUBCUTANEOUS
  Administered 2018-01-29: 3 [IU] via SUBCUTANEOUS
  Administered 2018-01-30: 1 [IU] via SUBCUTANEOUS
  Administered 2018-01-30: 2 [IU] via SUBCUTANEOUS
  Administered 2018-01-30: 1 [IU] via SUBCUTANEOUS

## 2018-01-25 NOTE — ED Notes (Signed)
Attempted IV access twice, unsuccessful.

## 2018-01-25 NOTE — ED Notes (Signed)
Pt transported to xray 

## 2018-01-25 NOTE — ED Triage Notes (Signed)
Pt reports she noticed her L big toe was puffy and had a callus underneath her toe. Pt reports today it burst and started draining with serosanguinous drainage. Pt reports T2DM, has AKA to R.

## 2018-01-25 NOTE — Progress Notes (Signed)
Pharmacy Antibiotic Note  Cheryl HansenMichelle L Wolf is a 53 y.o. female admitted on 01/25/2018 with wound infection.  Pharmacy has been consulted for Vancomycin dosing.  Plan: Vancomycin 1500 mg IV x 1, then 1000 mg IV q24hr Will monitor renal function, C&S and vanc levels as appropriate  Weight: 176 lb 12.9 oz (80.2 kg)  Temp (24hrs), Avg:98.4 F (36.9 C), Min:98.4 F (36.9 C), Max:98.4 F (36.9 C)  Recent Labs  Lab 01/25/18 1740  WBC 7.4  CREATININE 1.37*  LATICACIDVEN 1.7    CrCl cannot be calculated (Unknown ideal weight.).    No Known Allergies   Thank you for allowing pharmacy to be a part of this patient's care.  Jeanella Caraathy Orlando Thalmann, PharmD, [facility] Clinical Pharmacist Please see AMION for all Pharmacists' Contact Phone Numbers 01/25/2018, 7:01 PM

## 2018-01-25 NOTE — H&P (Signed)
History and Physical    Cheryl HansenMichelle L Wolf ZOX:[id number] DOB: 04/13/1965 DOA: 01/25/2018  PCP: [name], [name], PA-C  Patient coming from: Home  I have personally briefly reviewed patient's [facility]  Chief Complaint: Left foot infection  HPI: Cheryl HansenMichelle L Wolf is a 53 y.o. female with medical history significant for type 2 diabetes with neuropathy, hypertension, history of CVA, history of right BKA now wheelchair-bound, CKD stage II, and cataracts who presents to the ED with complaint of blister to her left toe and chronic diabetic ulcer.  Patient reports chronic diabetic ulcer of the plantar surface of her left foot at the base of the first toe.  Several days ago she noticed slight swelling at the top of her left foot near first toe which he says popped open today with some drainage.  She says she has poor vision due to cataracts and was unable to see the drainage but says her acquaintance some bloody discharge.  She has also noticed increased redness in her left lower leg above her ankle and around the calf.  She reports recent chills but denies fevers or diaphoresis.  She denies any chest pain, dyspnea, abdominal pain, or dysuria.  ED Course:  Initial vitals showed BP 158/76, pulse 98, RR 17, temp 98.4 Fahrenheit, SPO2 95% on room air.  CBC showed platelet clumps on smear, WBC 7.4, hemoglobin 11.0.  BMP notable for sodium 130, potassium 5.5, BUN 22, creatinine 1.37 (not significantly changed from recent baseline creatinine), lactic acid 1.7.  X-ray of the left foot did not show evidence of osteomyelitis.  Blood cultures were drawn and patient was given IV vancomycin, cefepime, and Flagyl.  The hospitalist service was consulted to admit for further management of cellulitis.  Review of Systems: As per HPI otherwise 10 point review of systems negative.    Past Medical History:  Diagnosis Date  . Anemia   . Arthritis    "spine, hands" (11/25/2015)  .  Back pain    "all my back; probably 3 times/week" (11/25/2015)  . Chronic indwelling Foley catheter 03/04/2017  . Diabetic foot ulcer associated with type 2 diabetes mellitus (HCC)   . Hypertension   . Intracerebral hemorrhage (HCC) As a teenager    States she had burst blood vessel as teenager, now with resultant minor visual field and hearing deficits  . Neuropathy   . Stroke [facility](HCC) ~ 1982   "my feeling on my RLE, right lower eye vision,  & hearing out of my right ear not the same since" (11/25/2015)  . Type II diabetes mellitus (HCC)     Past Surgical History:  Procedure Laterality Date  . AMPUTATION  11/24/2010   Procedure: AMPUTATION FOOT;  Surgeon: Toni ArthursJohn Hewitt, MD;  [facility];  Service: Orthopedics;  Laterality: Right;  Right  FOOT TRANS METATARSAL AMPUTATION  . AMPUTATION  07/02/2011   Procedure: AMPUTATION BELOW KNEE;  Surgeon: Toni ArthursJohn Hewitt, MD;  Location: [facility] OR;  Service: Orthopedics;  Laterality: Right;  . APPLICATION OF WOUND VAC  07/02/2011   Procedure: APPLICATION OF WOUND VAC;  Surgeon: Toni ArthursJohn Hewitt, MD;  Location: [facility] OR;  Service: Orthopedics;  Laterality: Right;  . BRAIN SURGERY  1980's   [place]  . CESAREAN SECTION  2004; 2006  . COLONOSCOPY N/A 09/17/2015   Procedure: COLONOSCOPY;  Surgeon: Charlott RakesVincent Schooler, MD;  [facility];  Service: Endoscopy;  Laterality: N/A;  . ESOPHAGOGASTRODUODENOSCOPY N/A 09/17/2015   Procedure: ESOPHAGOGASTRODUODENOSCOPY (EGD);  Surgeon: Charlott RakesVincent Schooler, MD;  [facility];  Service: Endoscopy;  Laterality: N/A;  . I&D EXTREMITY  11/24/2010   Procedure: IRRIGATION AND DEBRIDEMENT EXTREMITY;  Surgeon: Toni ArthursJohn Hewitt, MD;  Location: [facility] OR;  Service: Orthopedics;  Laterality: Left;  Debriedment of left plantar ulcer and trimming of toenails  . I&D EXTREMITY  07/02/2011   Procedure: IRRIGATION AND DEBRIDEMENT EXTREMITY;  Surgeon: Toni ArthursJohn Hewitt, MD;  Location: [facility] OR;  Service: Orthopedics;  Laterality:  Left;  I & D of Left Foot  . I&D EXTREMITY  07/06/2011   Procedure: IRRIGATION AND DEBRIDEMENT EXTREMITY;  Surgeon: Toni ArthursJohn Hewitt, MD;  Location: [facility] OR;  Service: Orthopedics;  Laterality: Right;  IRRIGATION/DEBRIDEMENT RIGHT BELOW KNEE AMPUTATION  . TUBAL LIGATION  2006     reports that she has never smoked. She has never used smokeless tobacco. She reports current alcohol use of about 2.0 standard drinks of alcohol per week. She reports that she does not use drugs.  No Known Allergies  Family History  Problem Relation Age of Onset  . Diabetes Mother   . Hypertension Mother   . Hyperlipidemia Mother   . Diabetes Father   . Cancer Father        prostate  . Hyperlipidemia Father   . Hypertension Father   . Diabetes Brother   . Peripheral Artery Disease Brother        has had toes amputated     Prior to Admission medications   Medication Sig Start Date End Date Taking? Authorizing Provider  acetaminophen (TYLENOL) 500 MG tablet Take 500-1,000 mg by mouth every 6 (six) hours as needed (for pain).   Yes [provider]  aspirin EC 81 MG tablet Take 81 mg by mouth daily as needed (chest pain).   Yes [provider]  calcium carbonate (TUMS - DOSED IN MG ELEMENTAL CALCIUM) 500 MG chewable tablet Chew 2 tablets (400 mg of elemental calcium total) by mouth 3 (three) times daily. Patient taking differently: Chew 2 tablets by mouth 2 (two) times daily.  09/18/15  Yes Love, Evlyn KannerPamela S, PA-C  gabapentin (NEURONTIN) 400 MG capsule Take 400 mg by mouth See admin instructions. Take 400 mg by mouth two times a day, and an additional 400 mg as needed for nerve pain 12/01/17  Yes [provider]  HYDROcodone-acetaminophen (NORCO/VICODIN) 5-325 MG tablet Take 1 tablet by mouth every 6 (six) hours as needed for moderate pain. Patient taking differently: Take 1 tablet by mouth every 6 (six) hours as needed for moderate pain or severe pain.  03/08/17  Yes Montez MoritaPaul, Keith, PA-C  Menthol,  Topical Analgesic, (ICY HOT EX) Apply 1 application topically 2 (two) times daily as needed (pain).   Yes [provider]  metFORMIN (GLUCOPHAGE) 500 MG tablet Take 1 tablet (500 mg total) by mouth daily with breakfast. 03/08/17 03/08/18 Yes Rhetta MuraSamtani, Jai-Gurmukh, MD  Naphazoline HCl (CLEAR EYES OP) Place 1 drop into both eyes daily as needed (dry eyes/itching).   Yes [provider]  cholecalciferol 5000 units TABS Take 1 tablet (5,000 Units total) by mouth daily. Patient not taking: Reported on 01/25/2018 03/08/17   Montez MoritaPaul, Keith, PA-C  enoxaparin (LOVENOX) 40 MG/0.4ML injection Inject 0.4 mLs (40 mg total) into the skin daily. Patient not taking: Reported on 01/25/2018 03/08/17   Montez MoritaPaul, Keith, PA-C  Nutritional Supplements (FEEDING SUPPLEMENT, BOOST BREEZE,) LIQD Take 1 Bottle by mouth 3 (three) times daily with meals. Patient not taking: Reported on 01/25/2018 03/08/17   Montez MoritaPaul, Keith, PA-C  Physical Exam: Vitals:   01/25/18 1559 01/25/18 1700 01/25/18 1715 01/25/18 2044  BP: (!) 158/76  (!) 120/94 (!) 162/82  Pulse: 98   97  Resp: 17   18  Temp: 98.4 F (36.9 C)   98.4 F (36.9 C)  TempSrc: Oral   Oral  SpO2: 95%   100%  Weight:  80.2 kg     Constitutional: Obese woman sitting up in bed, NAD, calm, comfortable Eyes: lids and conjunctivae normal ENMT: Mucous membranes are moist. Posterior pharynx clear of any exudate or lesions. rmal dentition.  Neck: normal, supple, no masses. Respiratory: Bibasilar crackles, no wheezing. Normal respiratory effort. No accessory muscle use.  Cardiovascular: Regular rate and rhythm, no murmurs / rubs / gallops.  Trace swelling left lower extremity.   Abdomen: no tenderness, no masses palpated. No hepatosplenomegaly. Musculoskeletal: Status post right BKA, range of motion of upper extremities and left lower extremity intact Skin: diabetic foot ulcer plantar surface at the base of the first left toe with overlying scabbing and associated blister  extending dorsally with scant serosanguineous drainage, erythemata and warmth of left lower extremity from above the ankle to upper calf without tenderness to palpation or active drainage. Neurologic: CN 2-12 grossly intact. Sensation intact, Strength 5/5 both upper and left lower extremities.  Psychiatric: Normal judgment and insight. Alert and oriented x 3. Normal mood.      Labs on Admission: I have personally reviewed following labs and imaging studies  CBC: Recent Labs  Lab 01/25/18 1740  WBC 7.4  NEUTROABS 5.1  HGB 11.0*  HCT 35.8*  MCV 95.0  PLT PLATELET CLUMPS NOTED ON SMEAR, UNABLE TO ESTIMATE   Basic Metabolic Panel: Recent Labs  Lab 01/25/18 1740  NA 130*  K 5.5*  CL 101  CO2 18*  GLUCOSE 174*  BUN 22*  CREATININE 1.37*  CALCIUM 8.4*   GFR: CrCl cannot be calculated (Unknown ideal weight.). Liver Function Tests: No results for input(s): AST, ALT, ALKPHOS, BILITOT, PROT, ALBUMIN in the last 168 hours. No results for input(s): LIPASE, AMYLASE in the last 168 hours. No results for input(s): AMMONIA in the last 168 hours. Coagulation Profile: No results for input(s): INR, PROTIME in the last 168 hours. Cardiac Enzymes: No results for input(s): CKTOTAL, CKMB, CKMBINDEX, TROPONINI in the last 168 hours. BNP (last 3 results) No results for input(s): PROBNP in the last 8760 hours. HbA1C: Recent Labs    01/25/18 2030  HGBA1C 7.5*   CBG: Recent Labs  Lab 01/25/18 1907 01/25/18 2106  GLUCAP 144* 126*   Lipid Profile: No results for input(s): CHOL, HDL, LDLCALC, TRIG, CHOLHDL, LDLDIRECT in the last 72 hours. Thyroid Function Tests: No results for input(s): TSH, T4TOTAL, FREET4, T3FREE, THYROIDAB in the last 72 hours. Anemia Panel: No results for input(s): VITAMINB12, FOLATE, FERRITIN, TIBC, IRON, RETICCTPCT in the last 72 hours. Urine analysis:    Component Value Date/Time   COLORURINE YELLOW 03/04/2017 1257   APPEARANCEUR CLOUDY (A) 03/04/2017 1257     LABSPEC 1.012 03/04/2017 1257   PHURINE 5.0 03/04/2017 1257   GLUCOSEU NEGATIVE 03/04/2017 1257   HGBUR MODERATE (A) 03/04/2017 1257   BILIRUBINUR NEGATIVE 03/04/2017 1257   KETONESUR 5 (A) 03/04/2017 1257   PROTEINUR 100 (A) 03/04/2017 1257   UROBILINOGEN 2.0 (H) 07/08/2011 0051   NITRITE NEGATIVE 03/04/2017 1257   LEUKOCYTESUR MODERATE (A) 03/04/2017 1257    Radiological Exams on Admission: Dg Foot Complete Left  Result Date: 01/25/2018 CLINICAL DATA:  Diabetic foot ulcer. History  of irrigation and drainage in 2012 and 2013. EXAM: LEFT FOOT - COMPLETE 3+ VIEW COMPARISON:  None. FINDINGS: The bones appear mildly demineralized. No evidence of acute fracture, dislocation or bone destruction. Minimal arthropathic changes are present within the great toe. There are prominent vascular calcifications consistent with underlying diabetes. No foreign body or soft tissue emphysema identified. Possible soft tissue ulceration over the plantar aspect of the forefoot, best seen on the lateral view. IMPRESSION: No radiographic evidence of osteomyelitis or foreign body. Electronically Signed   By: Carey Bullocks M.D.   On: 01/25/2018 18:15    EKG: Ordered and pending  Assessment/Plan Principal Problem:   Cellulitis of left lower extremity Active Problems:   Hypertension   Diabetes mellitus (HCC)   CKD (chronic kidney disease), stage II   Hyperkalemia  SHANNIE KONTOS is a 53 y.o. female with medical history significant for type 2 diabetes with neuropathy, hypertension, history of CVA, history of right BKA now wheelchair-bound, CKD stage II, and cataracts who presents to the ED with complaint of blister to her left toe and chronic diabetic ulcer admitted with suspected cellulitis of the left lower extremity.  Cellulitis of LLE, left plantar diabetic ulcer: Patient with erythema, increased warmth, and swelling of the left lower extremity suspicious for cellulitis.  Would also be concerned about  DVT given history of immobility as she is wheelchair-bound. -Narrow antibiotics to ceftriaxone alone for nonpurulent cellulitis -Obtain LLE venous Doppler -Consult to wound care  Hyperkalemia: K 5.5 on admission. -Obtain EKG, monitor on telemetry overnight -Give oral Kayexalate once and continue maintenance IV fluids overnight -Recheck in a.m.  Type 2 diabetes with neuropathy: On metformin 500 mg as an outpatient. -A1c 7.5 -Sensitive SSI while inpatient  Hypertension: Blood Pressure Mildly Elevated on admission, not on antihypertensives as an outpatient. -Continue to monitor  CKD stage II: Renal function appears to be near her recent baseline. -Continue to monitor   DVT prophylaxis: subq heparin  Code Status: Full Code  Family Communication: None present at bedside admission Disposition Plan: Pending clinical progress Consults called: None Admission status: Observation   Darreld Mclean MD [facility] Pager [phone]  If 7PM-7AM, please contact night-coverage [email or web address]  01/26/2018, 12:18 AM

## 2018-01-25 NOTE — ED Provider Notes (Signed)
[name] EMERGENCY [facility] Provider Note   CSN: [id number] Arrival date & time: 01/25/18  1555     History   Chief Complaint Chief Complaint  Patient presents with  . Wound Check    HPI Cheryl HansenMichelle L Wolf is a 53 y.o. 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  Patient reports she has a callus underneath left great toe that has been present x 6 months.  Over the last 2 days she noticed a new wound on dorsal surface with serosanguinous drainage. She has poor vision because of cataracts and is unable to see the leg well to describe if it has been red and swollen. She does report pain to her left inner thigh and foot that she describes as sharp and intermittent. She rates the pain 6 out of 10 severity. She has not taken anything for pain prior to arrival. Pt does not check her sugar regularly. Pt reports associated symptom of chills. Denies fever. Pt is wheelchair bound.  History provided by patient.  Past Medical History:  Diagnosis Date  . Anemia   . Arthritis    "spine, hands" (11/25/2015)  . Back pain    "all my back; probably 3 times/week" (11/25/2015)  . Chronic indwelling Foley catheter 03/04/2017  . Diabetic foot ulcer associated with type 2 diabetes mellitus (HCC)   . Hypertension   . Intracerebral hemorrhage (HCC) As a teenager    States she had burst blood vessel as teenager, now with resultant minor visual field and hearing deficits  . Neuropathy   . Stroke [facility](HCC) ~ 1982   "my feeling on my RLE, right lower eye vision,  & hearing out of my right ear not the same since" (11/25/2015)  . Type II diabetes mellitus [facility](HCC)     Patient Active Problem List   Diagnosis Date Noted  . Poor compliance 03/08/2017  . Hypoxia 03/07/2017  . Thyroid nodule 03/07/2017  . Chronic indwelling Foley catheter 03/04/2017  . Type II  diabetes mellitus (HCC)   . Femur fracture (HCC) 03/02/2017  . Orthostatic dizziness   . Urinary tract infection without hematuria   . Enterococcus faecalis infection   . Near syncope 11/25/2015  . Dehydration 11/25/2015  . Orthostatic hypotension 11/25/2015  . UTI (urinary tract infection) 11/25/2015  . Prolonged Q-T interval on ECG 09/25/2015  . Acute worsening of stage 3 chronic kidney disease (HCC) 09/24/2015  . Diabetes mellitus due to underlying condition with chronic kidney disease, without long-term current use of insulin (HCC)   . Fever   . Status post below knee amputation of right lower extremity (HCC)   . Diabetic peripheral neuropathy (HCC)   . Neuropathic pain   . Benign essential HTN   . Folliculitis   . Slow transit constipation   . Anemia of chronic disease   . Bilateral hydronephrosis   . Urinary retention   . CKD (chronic kidney disease)   . Uncontrolled type 2 diabetes mellitus with diabetic nephropathy, without long-term current use of insulin (HCC)   . Vasculopathy   . Debility 09/04/2015  . AKI (acute kidney injury) (HCC)   . DM type 2 with diabetic peripheral neuropathy (HCC)   . S/P BKA (below knee amputation) unilateral (HCC)   . Medical non-compliance   . Benign skin lesion of multiple sites   . Tachypnea   . Acute blood loss  anemia   . Neurogenic bladder   . Occult blood positive stool   . Rectovaginal fistula   . Controlled diabetes mellitus type 2 with complications (HCC)   . Acute on chronic renal failure (HCC)   . Hydronephrosis determined by ultrasound   . Papular rash   . Uncontrolled type 2 diabetes mellitus with complication (HCC)   . Paresthesia   . Thrombocytopenia (HCC) 08/23/2015  . Pressure ulcer 08/23/2015  . Severe anemia 08/23/2015  . Lower urinary tract infectious disease 08/23/2015  . Absolute anemia 08/23/2015  . Peripheral vascular disease, unspecified (HCC) 04/27/2012  . Unilateral complete BKA (HCC) 07/09/2011  . Gas  gangrene (HCC) 07/02/2011  . Sepsis(995.91) 07/02/2011  . Acute renal failure (HCC) 07/02/2011  . Diabetes mellitus (HCC) 11/22/2010  . Diabetic foot ulcer associated with type 2 diabetes mellitus (HCC)   . Hypertension   . Arthritis   . Neuropathy (HCC)   . Intracerebral hemorrhage [facility](HCC)     Past Surgical History:  Procedure Laterality Date  . AMPUTATION  11/24/2010   Procedure: AMPUTATION FOOT;  Surgeon: Toni ArthursJohn Hewitt, MD;  [facility];  Service: Orthopedics;  Laterality: Right;  Right  FOOT TRANS METATARSAL AMPUTATION  . AMPUTATION  07/02/2011   Procedure: AMPUTATION BELOW KNEE;  Surgeon: Toni ArthursJohn Hewitt, MD;  Location: [facility] OR;  Service: Orthopedics;  Laterality: Right;  . APPLICATION OF WOUND VAC  07/02/2011   Procedure: APPLICATION OF WOUND VAC;  Surgeon: Toni ArthursJohn Hewitt, MD;  Location: [facility] OR;  Service: Orthopedics;  Laterality: Right;  . BRAIN SURGERY  1980's   [place]  . CESAREAN SECTION  2004; 2006  . COLONOSCOPY N/A 09/17/2015   Procedure: COLONOSCOPY;  Surgeon: Charlott RakesVincent Schooler, MD;  [facility];  Service: [facility];  Laterality: N/A;  . ESOPHAGOGASTRODUODENOSCOPY N/A 09/17/2015   Procedure: ESOPHAGOGASTRODUODENOSCOPY (EGD);  Surgeon: Charlott RakesVincent Schooler, MD;  [facility];  Service: Wolf;  Laterality: N/A;  . I&D EXTREMITY  11/24/2010   Procedure: IRRIGATION AND DEBRIDEMENT EXTREMITY;  Surgeon: Toni ArthursJohn Hewitt, MD;  Location: [facility] OR;  Service: Orthopedics;  Laterality: Left;  Debriedment of left plantar ulcer and trimming of toenails  . I&D EXTREMITY  07/02/2011   Procedure: IRRIGATION AND DEBRIDEMENT EXTREMITY;  Surgeon: Toni ArthursJohn Hewitt, MD;  Location: [facility] OR;  Service: Orthopedics;  Laterality: Left;  I & D of Left Foot  . I&D EXTREMITY  07/06/2011   Procedure: IRRIGATION AND DEBRIDEMENT EXTREMITY;  Surgeon: Toni ArthursJohn Hewitt, MD;  Location: [facility] OR;  Service: Orthopedics;  Laterality: Right;  IRRIGATION/DEBRIDEMENT RIGHT BELOW KNEE AMPUTATION  .  TUBAL LIGATION  2006     OB History   No obstetric history on file.      Home Medications    Prior to Admission medications   Medication Sig Start Date End Date Taking? Authorizing Provider  acetaminophen (TYLENOL) 500 MG tablet Take 500-1,000 mg by mouth every 6 (six) hours as needed (for pain).   Yes [provider]  aspirin EC 81 MG tablet Take 81 mg by mouth daily as needed (chest pain).   Yes [provider]  calcium carbonate (TUMS - DOSED IN MG ELEMENTAL CALCIUM) 500 MG chewable tablet Chew 2 tablets (400 mg of elemental calcium total) by mouth 3 (three) times daily. Patient taking differently: Chew 2 tablets by mouth 2 (two) times daily.  09/18/15  Yes Love, Evlyn KannerPamela S, PA-C  gabapentin (NEURONTIN) 400 MG capsule Take 400 mg by mouth See admin instructions. Take 400 mg by mouth  two times a day, and an additional 400 mg as needed for nerve pain 12/01/17  Yes [provider]  HYDROcodone-acetaminophen (NORCO/VICODIN) 5-325 MG tablet Take 1 tablet by mouth every 6 (six) hours as needed for moderate pain. Patient taking differently: Take 1 tablet by mouth every 6 (six) hours as needed for moderate pain or severe pain.  03/08/17  Yes Montez Morita, PA-C  Menthol, Topical Analgesic, (ICY HOT EX) Apply 1 application topically 2 (two) times daily as needed (pain).   Yes [provider]  metFORMIN (GLUCOPHAGE) 500 MG tablet Take 1 tablet (500 mg total) by mouth daily with breakfast. 03/08/17 03/08/18 Yes Rhetta Mura, MD  Naphazoline HCl (CLEAR EYES OP) Place 1 drop into both eyes daily as needed (dry eyes/itching).   Yes [provider]  cholecalciferol 5000 units TABS Take 1 tablet (5,000 Units total) by mouth daily. Patient not taking: Reported on 01/25/2018 03/08/17   Montez Morita, PA-C  enoxaparin (LOVENOX) 40 MG/0.4ML injection Inject 0.4 mLs (40 mg total) into the skin daily. Patient not taking: Reported on 01/25/2018 03/08/17   Montez Morita, PA-C    Nutritional Supplements (FEEDING SUPPLEMENT, BOOST BREEZE,) LIQD Take 1 Bottle by mouth 3 (three) times daily with meals. Patient not taking: Reported on 01/25/2018 03/08/17   Montez Morita, PA-C    Family History Family History  Problem Relation Age of Onset  . Diabetes Mother   . Hypertension Mother   . Hyperlipidemia Mother   . Diabetes Father   . Cancer Father        prostate  . Hyperlipidemia Father   . Hypertension Father   . Diabetes Brother   . Peripheral Artery Disease Brother        has had toes amputated    Social History Social History   Tobacco Use  . Smoking status: Never Smoker  . Smokeless tobacco: Never Used  Substance Use Topics  . Alcohol use: Yes    Alcohol/week: 2.0 standard drinks    Types: 2 Glasses of wine per week  . Drug use: No     Allergies   Patient has no known allergies.   Review of Systems Review of Systems  Constitutional: Negative for chills and fever.  HENT: Negative for congestion, sinus pressure and sore throat.   Eyes: Negative for pain and visual disturbance.  Respiratory: Negative for chest tightness and shortness of breath.   Cardiovascular: Negative for chest pain and palpitations.  Gastrointestinal: Negative for abdominal pain, nausea and vomiting.  Genitourinary: Negative for hematuria.  Musculoskeletal: Positive for joint swelling. Negative for back pain and neck pain.  Skin: Negative for wound.  Allergic/Immunologic: Positive for immunocompromised state (diabetic).  Neurological: Negative for syncope, weakness and headaches.     Physical Exam Updated Vital Signs BP (!) 158/76 (BP Location: Left Arm)   Pulse 98   Temp 98.4 F (36.9 C) (Oral)   Resp 17   SpO2 95%   Physical Exam Vitals signs and nursing note reviewed.  Constitutional:      Appearance: She is not ill-appearing or toxic-appearing.  HENT:     Head: Normocephalic and atraumatic.     Nose: Nose normal.     Mouth/Throat:     Mouth: Mucous  membranes are moist.     Pharynx: Oropharynx is clear.  Eyes:     Conjunctiva/sclera: Conjunctivae normal.  Neck:     Musculoskeletal: Normal range of motion.  Cardiovascular:     Rate and Rhythm: Normal rate and regular  rhythm.     Pulses:          Dorsalis pedis pulses are detected w/ Doppler on the left side.     Heart sounds: Normal heart sounds.  Pulmonary:     Effort: Pulmonary effort is normal.     Breath sounds: Normal breath sounds.  Abdominal:     General: There is no distension.     Palpations: Abdomen is soft.     Tenderness: There is no abdominal tenderness. There is no guarding or rebound.  Musculoskeletal: Normal range of motion.     Comments: Right BKA. Left lower extremity with full ROM  Skin:    Capillary Refill: Capillary refill takes less than 2 seconds.     Comments:  1 cm x 2 cm callous on plantar surface under great toe with 4 cm x 5 cm wound on dorsal surface with serosanguinous drainage.   Erythema on dorsal aspect of left foot spreading up anterior tibia to medial thigh.   Neurological:     Mental Status: She is alert. Mental status is at baseline.     Motor: No weakness.  Psychiatric:        Behavior: Behavior normal.          ED Treatments / Results  Labs (all labs ordered are listed, but only abnormal results are displayed) Labs Reviewed  CBC WITH DIFFERENTIAL/PLATELET - Abnormal; Notable for the following components:      Result Value   RBC 3.77 (*)    Hemoglobin 11.0 (*)    HCT 35.8 (*)    All other components within normal limits  BASIC METABOLIC PANEL - Abnormal; Notable for the following components:   Sodium 130 (*)    Potassium 5.5 (*)    CO2 18 (*)    Glucose, Bld 174 (*)    BUN 22 (*)    Creatinine, Ser 1.37 (*)    Calcium 8.4 (*)    GFR calc non Af Amer 45 (*)    GFR calc Af Amer 52 (*)    All other components within normal limits  CULTURE, BLOOD (ROUTINE X 2)  CULTURE, BLOOD (ROUTINE X 2)  LACTIC ACID, PLASMA  LACTIC  ACID, PLASMA  CBG MONITORING, ED    EKG None  Radiology Dg Foot Complete Left  Result Date: 01/25/2018 CLINICAL DATA:  Diabetic foot ulcer. History of irrigation and drainage in 2012 and 2013. EXAM: LEFT FOOT - COMPLETE 3+ VIEW COMPARISON:  None. FINDINGS: The bones appear mildly demineralized. No evidence of acute fracture, dislocation or bone destruction. Minimal arthropathic changes are present within the great toe. There are prominent vascular calcifications consistent with underlying diabetes. No foreign body or soft tissue emphysema identified. Possible soft tissue ulceration over the plantar aspect of the forefoot, best seen on the lateral view. IMPRESSION: No radiographic evidence of osteomyelitis or foreign body. Electronically Signed   By: Carey Bullocks M.D.   On: 01/25/2018 18:15    Procedures Procedures (including critical care time)  Medications Ordered in ED Medications  metroNIDAZOLE (FLAGYL) IVPB 500 mg (has no administration in time range)  ceFEPIme (MAXIPIME) 2 g in sodium chloride 0.9 % 100 mL IVPB (has no administration in time range)  vancomycin (VANCOCIN) 1,500 mg in sodium chloride 0.9 % 500 mL IVPB (has no administration in time range)     Initial Impression / Assessment and Plan / ED Course  I have reviewed the triage vital signs and the nursing notes.  Pertinent labs &  imaging results that were available during my care of the patient were reviewed by me and considered in my medical decision making (see chart for details).   Pt is a noncompliant diabetic presenting with diabetic foot ulcer. Pt is afebrile and her physical exam is impressive for erythema of left lower extremity with lymphangitis to inner thigh. I viewed the left foot xray and do not see evidence of osteomyelitis or foreign body, agreeing with the radiologist. Will start IV antibiotics Vancomycin with pharmacy consult for renal dosing, Cefepime, Metronidazole for diabetic ulcer with cellulitis.  CBC is without leukocytosis, BMP shows sodium of 130, potassium of 5.5, BUN/Cr elevated to 22/1.37 that is elevated from her baseline. Pt will be admitted and managed by the hospitalist.  The patient was discussed with and seen by Dr. Silverio Lay who agrees with the treatment plan.   Final Clinical Impressions(s) / ED Diagnoses   Final diagnoses:  Diabetic ulcer of left midfoot associated with type 2 diabetes mellitus, unspecified ulcer stage (HCC)  Cellulitis of left lower extremity  AKI (acute kidney injury) [facility])    ED Discharge Orders    None       Kathyrn Lass 01/25/18 2037    Charlynne Pander, MD 01/25/18 2216

## 2018-01-26 ENCOUNTER — Observation Stay (HOSPITAL_COMMUNITY): Payer: Medicaid Other

## 2018-01-26 ENCOUNTER — Encounter (HOSPITAL_COMMUNITY): Payer: Self-pay

## 2018-01-26 DIAGNOSIS — N183 Chronic kidney disease, stage 3 unspecified: Secondary | ICD-10-CM

## 2018-01-26 DIAGNOSIS — E1159 Type 2 diabetes mellitus with other circulatory complications: Secondary | ICD-10-CM

## 2018-01-26 DIAGNOSIS — L538 Other specified erythematous conditions: Secondary | ICD-10-CM

## 2018-01-26 DIAGNOSIS — L0390 Cellulitis, unspecified: Secondary | ICD-10-CM | POA: Diagnosis present

## 2018-01-26 DIAGNOSIS — E875 Hyperkalemia: Secondary | ICD-10-CM

## 2018-01-26 DIAGNOSIS — M7989 Other specified soft tissue disorders: Secondary | ICD-10-CM

## 2018-01-26 LAB — BASIC METABOLIC PANEL
Anion gap: 11 (ref 5–15)
BUN: 23 mg/dL — ABNORMAL HIGH (ref 6–20)
CO2: 21 mmol/L — ABNORMAL LOW (ref 22–32)
Calcium: 8.2 mg/dL — ABNORMAL LOW (ref 8.9–10.3)
Chloride: 101 mmol/L (ref 98–111)
Creatinine, Ser: 1.37 mg/dL — ABNORMAL HIGH (ref 0.44–1.00)
GFR calc Af Amer: 52 mL/min — ABNORMAL LOW (ref >60)
GFR calc non Af Amer: 45 mL/min — ABNORMAL LOW (ref >60)
Glucose, Bld: 179 mg/dL — ABNORMAL HIGH (ref 70–99)
Potassium: 4.4 mmol/L (ref 3.5–5.1)
Sodium: 133 mmol/L — ABNORMAL LOW (ref 135–145)

## 2018-01-26 LAB — CBC
HCT: 31.7 % — ABNORMAL LOW (ref 36.0–46.0)
Hemoglobin: 10 g/dL — ABNORMAL LOW (ref 12.0–15.0)
MCH: 29.3 pg (ref 26.0–34.0)
MCHC: 31.5 g/dL (ref 30.0–36.0)
MCV: 93 fL (ref 80.0–100.0)
Platelets: 79 10*3/uL — ABNORMAL LOW (ref 150–400)
RBC: 3.41 MIL/uL — ABNORMAL LOW (ref 3.87–5.11)
RDW: 12.5 % (ref 11.5–15.5)
WBC: 5 10*3/uL (ref 4.0–10.5)
nRBC: 0 % (ref 0.0–0.2)

## 2018-01-26 LAB — HIV ANTIBODY (ROUTINE TESTING W REFLEX): HIV Screen 4th Generation wRfx: NONREACTIVE

## 2018-01-26 LAB — GLUCOSE, CAPILLARY
Glucose-Capillary: 158 mg/dL — ABNORMAL HIGH (ref 70–99)
Glucose-Capillary: 163 mg/dL — ABNORMAL HIGH (ref 70–99)
Glucose-Capillary: 196 mg/dL — ABNORMAL HIGH (ref 70–99)
Glucose-Capillary: 156 mg/dL — ABNORMAL HIGH (ref 70–99)

## 2018-01-26 MED ORDER — SODIUM POLYSTYRENE SULFONATE 15 GM/60ML PO SUSP
15.0000 g | Freq: Once | ORAL | Status: AC
  Administered 2018-01-26: 15 g via ORAL
  Filled 2018-01-26: qty 60

## 2018-01-26 MED ORDER — POVIDONE-IODINE 10 % EX SOLN
Freq: Two times a day (BID) | CUTANEOUS | Status: DC
  Administered 2018-01-26 – 2018-01-30 (×8): via TOPICAL
  Filled 2018-01-26: qty 118

## 2018-01-26 NOTE — Consult Note (Signed)
WOC Nurse wound consult note Reason for Consult: of left foot, full thickness ulcer at base of 1st metatarsal head, cellulitis of foot, partial thickness ruptured bulla on lateral first toe. Wound type:neuropathic, infectious Pressure Injury POA: NA Measurement:1.5cm x 1.5cm x 0.1cm dark red dried exudate in wound bed, erythema present, no induration or odor noted, no drainage. 0.2cm x 0.4cm partial thickness ruptured bulla with moist red wound bed, no odor, clear thin drainage, ecchymosis surrounding site. Lower leg reddened and slightly edematous. Wound bed:see above Drainage (amout, consistency, odor) see above Periwound:see above Dressing procedure/placement/frequency: I have provided nurses with orders for betadine painting to ruptured bulla and drying wounds on plantar surface of great metatarsal head. Leave open to air. Pt has been given Flagyl, Rocephin, and Vanc. Educated pt on correct way to elevate leg and float heel. Importance of keeping slight bend in knee explained. Patient very interested in learning how to take better care of leg. She has already had her right leg amputated above the knee. Nutrition not an obvious problem, HgAic 7.5. We will not follow, but will remain available to this patient, to nursing, and the medical and/or surgical teams.  Please re-consult if we need to assist further.  Barnett Hatter, RN-C, WTA-C, O[facility] Wound Treatment Associate Ostomy Care Associate

## 2018-01-26 NOTE — Plan of Care (Signed)
  Problem: Skin Integrity: Goal: Risk for impaired skin integrity will decrease Outcome: Progressing   Problem: Safety: Goal: Ability to remain free from injury will improve Outcome: Progressing   Problem: Pain Managment: Goal: General experience of comfort will improve Outcome: Progressing   Problem: Activity: Goal: Risk for activity intolerance will decrease Outcome: Progressing   Problem: Clinical Measurements: Goal: Will remain free from infection Outcome: Progressing   Problem: Education: Goal: Knowledge of General Education information will improve Description: Including pain rating scale, medication(s)/side effects and non-pharmacologic comfort measures Outcome: Progressing   

## 2018-01-26 NOTE — Progress Notes (Addendum)
PROGRESS NOTE    [name]   ZOX:[id number]  DOB: [birth year]  DOA: 01/25/2018 PCP: Loletta Specter, PA-C   Brief Narrative:  [name]   is a 53 y.o. female with medical history significant for type 2 diabetes with neuropathy, hypertension, history of CVA,  right BKA who is wheelchair-bound and cataracts. She presents with left foot and leg swelling with erythema and pain. She also has a callus on the same foot near her metatarsal head.    Subjective: Unable to tell if leg is improving due to her eyesight. Pain is on and off.     Assessment & Plan:   Principal Problem:   Cellulitis of left lower extremity - may be related to the callus which appears quite deep- will ask for wound care eval - she also has an area on her 1st toe where a bullae has ruptured - fever 101 this AM - cont Ceftriaxone for now- foot and most of leg below the knee involved- I don't see much improvement from the picture from yesterday- elevate foot - xray of foot unrevealing for osteomyelitis or foreign body- follow up blood cultures  Active Problems:   Hypertension - in ED- ? Due to pain- follow    Diabetes mellitus with neuropathy - does not check sugars and states glucometer is broken - on Glucophage at home -   A1c 7.5- will need to increase Glucophage to 1000 mg BID when discharged - cont SSI - cont Gabapentin    CKD (chronic kidney disease), stage 3 - Cr at baseline    Hyperkalemia - K 5.5 on admission- has resolved  Time spent in minutes: 35 DVT prophylaxis: Heparin Code Status: full code Family Communication:   Disposition Plan: cont to treat infection Consultants:   none Procedures:   none Antimicrobials:  Anti-infectives (From admission, onward)   Start     Dose/Rate Route Frequency Ordered Stop   01/26/18 1800  vancomycin (VANCOCIN) IVPB 1000 mg/200 mL premix  Status:  Discontinued     1,000 mg 200 mL/hr over 60 Minutes Intravenous Every 24 hours 01/25/18  1859 01/25/18 1956   01/26/18 0600  cefTRIAXone (ROCEPHIN) 1 g in sodium chloride 0.9 % 100 mL IVPB     1 g 200 mL/hr over 30 Minutes Intravenous Every 24 hours 01/25/18 1958     01/25/18 1800  vancomycin (VANCOCIN) 1,500 mg in sodium chloride 0.9 % 500 mL IVPB  Status:  Discontinued     1,500 mg 250 mL/hr over 120 Minutes Intravenous  Once 01/25/18 1755 01/25/18 1956   01/25/18 1745  metroNIDAZOLE (FLAGYL) IVPB 500 mg  Status:  Discontinued     500 mg 100 mL/hr over 60 Minutes Intravenous  Once 01/25/18 1740 01/25/18 2020   01/25/18 1745  ceFEPIme (MAXIPIME) 2 g in sodium chloride 0.9 % 100 mL IVPB  Status:  Discontinued     2 g 200 mL/hr over 30 Minutes Intravenous Every 8 hours 01/25/18 1740 01/25/18 1956       Objective: Vitals:   01/26/18 0502 01/26/18 0547 01/26/18 0628 01/26/18 0647  BP: (!) 122/57     Pulse: (!) 105     Resp: 16     Temp: (!) 101.6 F (38.7 C) (!) 101.2 F (38.4 C)  100.1 F (37.8 C)  TempSrc: Oral Oral  Oral  SpO2: 93%     Weight:   101.4 kg   Height:   5\' 7"  (1.702 m)     Intake/Output  Summary (Last 24 hours) at 01/26/2018 1045 Last data filed at 01/26/2018 0900 Gross per 24 hour  Intake 1400 ml  Output -  Net 1400 ml   Filed Weights   01/25/18 1700 01/26/18 0628  Weight: 80.2 kg 101.4 kg    Examination: General exam: Appears comfortable  HEENT: PERRLA, oral mucosa moist, no sclera icterus or thrush Respiratory system: Clear to auscultation. Respiratory effort normal. Cardiovascular system: S1 & S2 heard, RRR.   Gastrointestinal system: Abdomen soft, non-tender, nondistended. Normal bowel sounds. Central nervous system: Alert and oriented. No focal neurological deficits. Extremities: No cyanosis, clubbing - left foot and leg erythematous, swollen and tender- right BKA Skin: callus on planter aspect of left foot near head of 1st metatarsal- ruptured blister on left first toe Psychiatry:  Mood & affect appropriate.     Data Reviewed: I  have personally reviewed following labs and imaging studies  CBC: Recent Labs  Lab 01/25/18 1740 01/26/18 0330  WBC 7.4 5.0  NEUTROABS 5.1  --   HGB 11.0* 10.0*  HCT 35.8* 31.7*  MCV 95.0 93.0  PLT PLATELET CLUMPS NOTED ON SMEAR, UNABLE TO ESTIMATE 79*   Basic Metabolic Panel: Recent Labs  Lab 01/25/18 1740 01/26/18 0330  NA 130* 133*  K 5.5* 4.4  CL 101 101  CO2 18* 21*  GLUCOSE 174* 179*  BUN 22* 23*  CREATININE 1.37* 1.37*  CALCIUM 8.4* 8.2*   GFR: Estimated Creatinine Clearance: 59.4 mL/min (A) (by C-G formula based on SCr of 1.37 mg/dL (H)). Liver Function Tests: No results for input(s): AST, ALT, ALKPHOS, BILITOT, PROT, ALBUMIN in the last 168 hours. No results for input(s): LIPASE, AMYLASE in the last 168 hours. No results for input(s): AMMONIA in the last 168 hours. Coagulation Profile: No results for input(s): INR, PROTIME in the last 168 hours. Cardiac Enzymes: No results for input(s): CKTOTAL, CKMB, CKMBINDEX, TROPONINI in the last 168 hours. BNP (last 3 results) No results for input(s): PROBNP in the last 8760 hours. HbA1C: Recent Labs    01/25/18 2030  HGBA1C 7.5*   CBG: Recent Labs  Lab 01/25/18 1907 01/25/18 2106 01/26/18 0655  GLUCAP 144* 126* 158*   Lipid Profile: No results for input(s): CHOL, HDL, LDLCALC, TRIG, CHOLHDL, LDLDIRECT in the last 72 hours. Thyroid Function Tests: No results for input(s): TSH, T4TOTAL, FREET4, T3FREE, THYROIDAB in the last 72 hours. Anemia Panel: No results for input(s): VITAMINB12, FOLATE, FERRITIN, TIBC, IRON, RETICCTPCT in the last 72 hours. Urine analysis:    Component Value Date/Time   COLORURINE YELLOW 03/04/2017 1257   APPEARANCEUR CLOUDY (A) 03/04/2017 1257   LABSPEC 1.012 03/04/2017 1257   PHURINE 5.0 03/04/2017 1257   GLUCOSEU NEGATIVE 03/04/2017 1257   HGBUR MODERATE (A) 03/04/2017 1257   BILIRUBINUR NEGATIVE 03/04/2017 1257   KETONESUR 5 (A) 03/04/2017 1257   PROTEINUR 100 (A) 03/04/2017  1257   UROBILINOGEN 2.0 (H) 07/08/2011 0051   NITRITE NEGATIVE 03/04/2017 1257   LEUKOCYTESUR MODERATE (A) 03/04/2017 1257   Sepsis Labs: @LABRCNTIP (procalcitonin:4,lacticidven:4) )No results found for this or any previous visit (from the past 240 hour(s)).       Radiology Studies: Dg Foot Complete Left  Result Date: 01/25/2018 CLINICAL DATA:  Diabetic foot ulcer. History of irrigation and drainage in 2012 and 2013. EXAM: LEFT FOOT - COMPLETE 3+ VIEW COMPARISON:  None. FINDINGS: The bones appear mildly demineralized. No evidence of acute fracture, dislocation or bone destruction. Minimal arthropathic changes are present within the great toe. There are prominent vascular  calcifications consistent with underlying diabetes. No foreign body or soft tissue emphysema identified. Possible soft tissue ulceration over the plantar aspect of the forefoot, best seen on the lateral view. IMPRESSION: No radiographic evidence of osteomyelitis or foreign body. Electronically Signed   By: Carey Bullocks M.D.   On: 01/25/2018 18:15   Vas [place] Lower Extremity Venous (dvt)  Result Date: 01/26/2018  Lower Venous Study Indications: Pain, Swelling, and Erythema.  Performing Technologist: Toma Deiters RVS  Examination Guidelines: A complete evaluation includes B-mode imaging, spectral Doppler, color Doppler, and power Doppler as needed of all accessible portions of each vessel. Bilateral testing is considered an integral part of a complete examination. Limited examinations for reoccurring indications may be performed as noted.  Right Venous Findings: +---------+---------------+---------+-----------+----------+-------+          CompressibilityPhasicitySpontaneityPropertiesSummary +---------+---------------+---------+-----------+----------+-------+ CFV      Full           Yes      Yes                          +---------+---------------+---------+-----------+----------+-------+ SFJ      Full                                                  +---------+---------------+---------+-----------+----------+-------+ FV Prox  Full           Yes      Yes                          +---------+---------------+---------+-----------+----------+-------+ FV Mid   Full                                                 +---------+---------------+---------+-----------+----------+-------+ FV DistalFull           Yes      Yes                          +---------+---------------+---------+-----------+----------+-------+ PFV      Full           Yes      Yes                          +---------+---------------+---------+-----------+----------+-------+ POP      Full           Yes      Yes                          +---------+---------------+---------+-----------+----------+-------+ PTV      Full                                                 +---------+---------------+---------+-----------+----------+-------+ PERO     Full                                                 +---------+---------------+---------+-----------+----------+-------+  Right Technical Findings: Enlargement of the inguinal lymph nodes noted  Left Venous Findings: +---+---------------+---------+-----------+----------+-------+    CompressibilityPhasicitySpontaneityPropertiesSummary +---+---------------+---------+-----------+----------+-------+ CFVFull           Yes      Yes                          +---+---------------+---------+-----------+----------+-------+ SFJFull                                                 +---+---------------+---------+-----------+----------+-------+    Summary: Right: There is no evidence of deep vein thrombosis in the lower extremity. No cystic structure found in the popliteal fossa. See technical findings listed above Left: There is no evidence of a common femoral vein obstruction.  *See table(s) above for measurements and observations.    Preliminary       Scheduled  Meds: . gabapentin  400 mg Oral BID  . heparin  5,000 Units Subcutaneous Q8H  . insulin aspart  0-9 Units Subcutaneous TID WC  . povidone-iodine   Topical BID  . sodium chloride flush  3 mL Intravenous Q12H   Continuous Infusions: . cefTRIAXone (ROCEPHIN)  IV 1 g (01/26/18 0518)     LOS: 0 days      Calvert CantorSaima Keirah Konitzer, MD [facility] Pager: [email or web address] Password [email or web address] 01/26/2018, 10:45 AM

## 2018-01-26 NOTE — Progress Notes (Signed)
Left lower extremity venous duplex completed. Preliminary results - Negative. Full report in Chart review CV Proc. [place] Lenzi Marmo,RVS 01/26/2018, 10:19 AM

## 2018-01-27 DIAGNOSIS — E11621 Type 2 diabetes mellitus with foot ulcer: Secondary | ICD-10-CM

## 2018-01-27 DIAGNOSIS — L03116 Cellulitis of left lower limb: Secondary | ICD-10-CM

## 2018-01-27 DIAGNOSIS — L97429 Non-pressure chronic ulcer of left heel and midfoot with unspecified severity: Secondary | ICD-10-CM

## 2018-01-27 LAB — GLUCOSE, CAPILLARY
Glucose-Capillary: 170 mg/dL — ABNORMAL HIGH (ref 70–99)
Glucose-Capillary: 216 mg/dL — ABNORMAL HIGH (ref 70–99)
Glucose-Capillary: 186 mg/dL — ABNORMAL HIGH (ref 70–99)
Glucose-Capillary: 170 mg/dL — ABNORMAL HIGH (ref 70–99)

## 2018-01-27 MED ORDER — SODIUM CHLORIDE 0.9 % IV SOLN
2.0000 g | INTRAVENOUS | Status: DC
  Administered 2018-01-28 – 2018-01-30 (×3): 2 g via INTRAVENOUS
  Filled 2018-01-27 (×3): qty 20

## 2018-01-27 MED ORDER — ONDANSETRON HCL 4 MG/2ML IJ SOLN: 4.0000 mg | Freq: Four times a day (QID) | INTRAMUSCULAR | Status: DC | PRN

## 2018-01-27 MED ORDER — SODIUM CHLORIDE 0.9% FLUSH: 10.0000 mL | INTRAVENOUS | Status: DC | PRN

## 2018-01-27 MED ORDER — SODIUM CHLORIDE 0.9 % IV SOLN
1.0000 g | Freq: Once | INTRAVENOUS | Status: AC
  Administered 2018-01-27: 1 g via INTRAVENOUS
  Filled 2018-01-27 (×2): qty 10

## 2018-01-27 NOTE — Progress Notes (Signed)
PROGRESS NOTE    [name]   WUJ:[id number]  DOB: 18-Dec-1965  DOA: 01/25/2018 PCP: Loletta Specter, PA-C   Brief Narrative:  [name]   is a 53 y.o. female with medical history significant for type 2 diabetes with neuropathy, hypertension, history of CVA,  right BKA who is wheelchair-bound and cataracts. She presents with left foot and leg swelling with erythema and pain. She also has a callus on the same foot near her metatarsal head.    Subjective: No complaints today.     Assessment & Plan:   Principal Problem:   Cellulitis of left lower extremity - may be related to the callus which appears quite deep- will ask for wound care eval - she also has an area on the dorsum of her 1st toe where a bullae has ruptured - fever 101 - has improved to 98-99 - cont Ceftriaxone for now increase dose to 2 gm/ day - foot and most of leg below the knee involved- I still don't see much improvement but as fevers resolving, will not change Ceftriaxone but will increase dose (weight based) - continue to elevate foot - xray of foot unrevealing for osteomyelitis or foreign body -   blood cultures negative  Active Problems:   Hypertension - in ED- ? Due to pain- BP now on low side    Diabetes mellitus with neuropathy - does not check sugars and states glucometer is broken - on Glucophage at home -   A1c 7.5- will need to increase Glucophage to 1000 mg BID when discharged - cont SSI - cont Gabapentin    CKD (chronic kidney disease), stage 3 - Cr at baseline    Hyperkalemia - K 5.5 on admission- has resolved  B/l cataract - she states this is the reason why her vision is so poor- she saw an ophthalmologist last month and is yet to schedule surgery  Time spent in minutes: 35 DVT prophylaxis: Heparin Code Status: full code Family Communication:   Disposition Plan: cont to treat infection Consultants:   none Procedures:   none Antimicrobials:  Anti-infectives  (From admission, onward)   Start     Dose/Rate Route Frequency Ordered Stop   01/26/18 1800  vancomycin (VANCOCIN) IVPB 1000 mg/200 mL premix  Status:  Discontinued     1,000 mg 200 mL/hr over 60 Minutes Intravenous Every 24 hours 01/25/18 1859 01/25/18 1956   01/26/18 0600  cefTRIAXone (ROCEPHIN) 1 g in sodium chloride 0.9 % 100 mL IVPB     1 g 200 mL/hr over 30 Minutes Intravenous Every 24 hours 01/25/18 1958     01/25/18 1800  vancomycin (VANCOCIN) 1,500 mg in sodium chloride 0.9 % 500 mL IVPB  Status:  Discontinued     1,500 mg 250 mL/hr over 120 Minutes Intravenous  Once 01/25/18 1755 01/25/18 1956   01/25/18 1745  metroNIDAZOLE (FLAGYL) IVPB 500 mg  Status:  Discontinued     500 mg 100 mL/hr over 60 Minutes Intravenous  Once 01/25/18 1740 01/25/18 2020   01/25/18 1745  ceFEPIme (MAXIPIME) 2 g in sodium chloride 0.9 % 100 mL IVPB  Status:  Discontinued     2 g 200 mL/hr over 30 Minutes Intravenous Every 8 hours 01/25/18 1740 01/25/18 1956       Objective: Vitals:   01/26/18 0647 01/26/18 1334 01/26/18 2140 01/27/18 0300  BP:  (!) 132/52 (!) 92/55 100/62  Pulse:  (!) 103 98 88  Resp:  17 18 18  Temp: 100.1 F (37.8 C) 98.5 F (36.9 C) 99.5 F (37.5 C) 99 F (37.2 C)  TempSrc: Oral Oral Oral Oral  SpO2:  100% 98% 100%  Weight:      Height:        Intake/Output Summary (Last 24 hours) at 01/27/2018 1000 Last data filed at 01/26/2018 1500 Gross per 24 hour  Intake 480 ml  Output -  Net 480 ml   Filed Weights   01/25/18 1700 01/26/18 0628  Weight: 80.2 kg 101.4 kg    Examination: General exam: Appears comfortable  HEENT:   oral mucosa moist, no sclera icterus or thrush- very poor eyesight due to cataracts Respiratory system: Clear to auscultation. Respiratory effort normal. Cardiovascular system: S1 & S2 heard, RRR.   Gastrointestinal system: Abdomen soft, non-tender, nondistended. Normal bowel sounds. Central nervous system: Alert and oriented. No focal  neurological deficits. Extremities: No cyanosis, clubbing - left foot and leg erythematous, swollen and tender- right BKA Skin:        Psychiatry:  Mood & affect appropriate.     Data Reviewed: I have personally reviewed following labs and imaging studies  CBC: Recent Labs  Lab 01/25/18 1740 01/26/18 0330  WBC 7.4 5.0  NEUTROABS 5.1  --   HGB 11.0* 10.0*  HCT 35.8* 31.7*  MCV 95.0 93.0  PLT PLATELET CLUMPS NOTED ON SMEAR, UNABLE TO ESTIMATE 79*   Basic Metabolic Panel: Recent Labs  Lab 01/25/18 1740 01/26/18 0330  NA 130* 133*  K 5.5* 4.4  CL 101 101  CO2 18* 21*  GLUCOSE 174* 179*  BUN 22* 23*  CREATININE 1.37* 1.37*  CALCIUM 8.4* 8.2*   GFR: Estimated Creatinine Clearance: 59.4 mL/min (A) (by C-G formula based on SCr of 1.37 mg/dL (H)). Liver Function Tests: No results for input(s): AST, ALT, ALKPHOS, BILITOT, PROT, ALBUMIN in the last 168 hours. No results for input(s): LIPASE, AMYLASE in the last 168 hours. No results for input(s): AMMONIA in the last 168 hours. Coagulation Profile: No results for input(s): INR, PROTIME in the last 168 hours. Cardiac Enzymes: No results for input(s): CKTOTAL, CKMB, CKMBINDEX, TROPONINI in the last 168 hours. BNP (last 3 results) No results for input(s): PROBNP in the last 8760 hours. HbA1C: Recent Labs    01/25/18 2030  HGBA1C 7.5*   CBG: Recent Labs  Lab 01/26/18 0655 01/26/18 1112 01/26/18 1548 01/26/18 2137 01/27/18 0616  GLUCAP 158* 163* 196* 156* 170*   Lipid Profile: No results for input(s): CHOL, HDL, LDLCALC, TRIG, CHOLHDL, LDLDIRECT in the last 72 hours. Thyroid Function Tests: No results for input(s): TSH, T4TOTAL, FREET4, T3FREE, THYROIDAB in the last 72 hours. Anemia Panel: No results for input(s): VITAMINB12, FOLATE, FERRITIN, TIBC, IRON, RETICCTPCT in the last 72 hours. Urine analysis:    Component Value Date/Time   COLORURINE YELLOW 03/04/2017 1257   APPEARANCEUR CLOUDY (A) 03/04/2017  1257   LABSPEC 1.012 03/04/2017 1257   PHURINE 5.0 03/04/2017 1257   GLUCOSEU NEGATIVE 03/04/2017 1257   HGBUR MODERATE (A) 03/04/2017 1257   BILIRUBINUR NEGATIVE 03/04/2017 1257   KETONESUR 5 (A) 03/04/2017 1257   PROTEINUR 100 (A) 03/04/2017 1257   UROBILINOGEN 2.0 (H) 07/08/2011 0051   NITRITE NEGATIVE 03/04/2017 1257   LEUKOCYTESUR MODERATE (A) 03/04/2017 1257   Sepsis Labs: @LABRCNTIP (procalcitonin:4,lacticidven:4) ) Recent Results (from the past 240 hour(s))  Blood culture (routine x 2)     Status: None (Preliminary result)   Collection Time: 01/25/18  5:40 PM  Result Value Ref Range Status  Specimen Description BLOOD LEFT ANTECUBITAL  Final   Special Requests   Final    BOTTLES DRAWN AEROBIC AND ANAEROBIC Blood Culture adequate volume   Culture   Final    NO GROWTH < 24 HOURS Performed at [facility], [address], [place], [place] [address]    Report Status PENDING  Incomplete  Blood culture (routine x 2)     Status: None (Preliminary result)   Collection Time: 01/25/18  5:53 PM  Result Value Ref Range Status   Specimen Description BLOOD BLOOD RIGHT FOREARM  Final   Special Requests   Final    BOTTLES DRAWN AEROBIC AND ANAEROBIC Blood Culture results may not be optimal due to an inadequate volume of blood received in culture bottles   Culture   Final    NO GROWTH < 24 HOURS Performed at [facility], [address], [place], [place] [address]    Report Status PENDING  Incomplete         Radiology Studies: Dg Foot Complete Left  Result Date: 01/25/2018 CLINICAL DATA:  Diabetic foot ulcer. History of irrigation and drainage in 2012 and 2013. EXAM: LEFT FOOT - COMPLETE 3+ VIEW COMPARISON:  None. FINDINGS: The bones appear mildly demineralized. No evidence of acute fracture, dislocation or bone destruction. Minimal arthropathic changes are present within the great toe. There are prominent vascular calcifications consistent with underlying diabetes.  No foreign body or soft tissue emphysema identified. Possible soft tissue ulceration over the plantar aspect of the forefoot, best seen on the lateral view. IMPRESSION: No radiographic evidence of osteomyelitis or foreign body. Electronically Signed   By: Carey Bullocks M.D.   On: 01/25/2018 18:15   Vas [place] Lower Extremity Venous (dvt)  Result Date: 01/26/2018  Lower Venous Study Indications: Pain, Swelling, and Erythema.  Performing Technologist: Toma Deiters RVS  Examination Guidelines: A complete evaluation includes B-mode imaging, spectral Doppler, color Doppler, and power Doppler as needed of all accessible portions of each vessel. Bilateral testing is considered an integral part of a complete examination. Limited examinations for reoccurring indications may be performed as noted.  Right Venous Findings: +---------+---------------+---------+-----------+----------+-------+          CompressibilityPhasicitySpontaneityPropertiesSummary +---------+---------------+---------+-----------+----------+-------+ CFV      Full           Yes      Yes                          +---------+---------------+---------+-----------+----------+-------+ SFJ      Full                                                 +---------+---------------+---------+-----------+----------+-------+ FV Prox  Full           Yes      Yes                          +---------+---------------+---------+-----------+----------+-------+ FV Mid   Full                                                 +---------+---------------+---------+-----------+----------+-------+ FV DistalFull  Yes      Yes                          +---------+---------------+---------+-----------+----------+-------+ PFV      Full           Yes      Yes                          +---------+---------------+---------+-----------+----------+-------+ POP      Full           Yes      Yes                           +---------+---------------+---------+-----------+----------+-------+ PTV      Full                                                 +---------+---------------+---------+-----------+----------+-------+ PERO     Full                                                 +---------+---------------+---------+-----------+----------+-------+  Right Technical Findings: Enlargement of the inguinal lymph nodes noted  Left Venous Findings: +---+---------------+---------+-----------+----------+-------+    CompressibilityPhasicitySpontaneityPropertiesSummary +---+---------------+---------+-----------+----------+-------+ CFVFull           Yes      Yes                          +---+---------------+---------+-----------+----------+-------+ SFJFull                                                 +---+---------------+---------+-----------+----------+-------+    Summary: Right: There is no evidence of deep vein thrombosis in the lower extremity. No cystic structure found in the popliteal fossa. See technical findings listed above Left: There is no evidence of a common femoral vein obstruction.  *See table(s) above for measurements and observations. Electronically signed by Sherald Hesshristopher Clark MD on 01/26/2018 at 2:07:24 PM.    Final       Scheduled Meds: . gabapentin  400 mg Oral BID  . heparin  5,000 Units Subcutaneous Q8H  . insulin aspart  0-9 Units Subcutaneous TID WC  . povidone-iodine   Topical BID  . sodium chloride flush  3 mL Intravenous Q12H   Continuous Infusions: . cefTRIAXone (ROCEPHIN)  IV 1 g (01/27/18 0533)     LOS: 1 day      Calvert CantorSaima Bassheva Flury, MD [facility] Pager: [email or web address] Password [email or web address] 01/27/2018, 10:00 AM

## 2018-01-27 NOTE — Consult Note (Signed)
ORTHOPAEDIC CONSULTATION  REQUESTING PHYSICIAN: Calvert Cantor, MD  Chief Complaint: Cellulitis left leg.  HPI: [name] is a 53 y.o. female who presents with cellulitis left leg and ulcer at the MTP joint left great toe.  Patient is type II diabetic she is status post a right transtibial amputation.  Patient states she has had the ulcer for about a week and has had the plantar callus for prolonged period of time.  Past Medical History:  Diagnosis Date  . Anemia   . Arthritis    "spine, hands" (11/25/2015)  . Back pain    "all my back; probably 3 times/week" (11/25/2015)  . Chronic indwelling Foley catheter 03/04/2017  . Diabetic foot ulcer associated with type 2 diabetes mellitus (HCC)   . Hypertension   . Intracerebral hemorrhage (HCC) As a teenager    States she had burst blood vessel as teenager, now with resultant minor visual field and hearing deficits  . Neuropathy   . Stroke [facility]) ~ 1982   "my feeling on my RLE, right lower eye vision,  & hearing out of my right ear not the same since" (11/25/2015)  . Type II diabetes mellitus (HCC)    Past Surgical History:  Procedure Laterality Date  . AMPUTATION  11/24/2010   Procedure: AMPUTATION FOOT;  Surgeon: [name], MD;  Location: [facility] OR;  Service: Orthopedics;  Laterality: Right;  Right  FOOT TRANS METATARSAL AMPUTATION  . AMPUTATION  07/02/2011   Procedure: AMPUTATION BELOW KNEE;  Surgeon: [name], MD;  Location: [facility] OR;  Service: Orthopedics;  Laterality: Right;  . APPLICATION OF WOUND VAC  07/02/2011   Procedure: APPLICATION OF WOUND VAC;  Surgeon: [name], MD;  Location: [facility] OR;  Service: Orthopedics;  Laterality: Right;  . BRAIN SURGERY  1980's   Previous brain surgery in [place], [place]  . CESAREAN SECTION  2004; 2006  . COLONOSCOPY N/A 09/17/2015   Procedure: COLONOSCOPY;  Surgeon: Charlott Rakes, MD;  Location: [facility] ENDOSCOPY;  Service: Endoscopy;  Laterality: N/A;  . ESOPHAGOGASTRODUODENOSCOPY N/A  09/17/2015   Procedure: ESOPHAGOGASTRODUODENOSCOPY (EGD);  Surgeon: Charlott Rakes, MD;  Location: [facility] ENDOSCOPY;  Service: Endoscopy;  Laterality: N/A;  . I&D EXTREMITY  11/24/2010   Procedure: IRRIGATION AND DEBRIDEMENT EXTREMITY;  Surgeon: [name], MD;  Location: [facility] OR;  Service: Orthopedics;  Laterality: Left;  Debriedment of left plantar ulcer and trimming of toenails  . I&D EXTREMITY  07/02/2011   Procedure: IRRIGATION AND DEBRIDEMENT EXTREMITY;  Surgeon: [name], MD;  Location: [facility] OR;  Service: Orthopedics;  Laterality: Left;  I & D of Left Foot  . I&D EXTREMITY  07/06/2011   Procedure: IRRIGATION AND DEBRIDEMENT EXTREMITY;  Surgeon: [name], MD;  Location: [facility] OR;  Service: Orthopedics;  Laterality: Right;  IRRIGATION/DEBRIDEMENT RIGHT BELOW KNEE AMPUTATION  . TUBAL LIGATION  2006   Social History   Socioeconomic History  . Marital status: Widowed    Spouse name: Not on file  . Number of children: Not on file  . Years of education: Not on file  . Highest education level: Not on file  Occupational History  . Not on file  Social Needs  . Financial resource strain: Not on file  . Food insecurity:    Worry: Not on file    Inability: Not on file  . Transportation needs:    Medical: Not on file    Non-medical: Not on file  Tobacco Use  . Smoking status: Never Smoker  . Smokeless tobacco:  Never Used  Substance and Sexual Activity  . Alcohol use: Yes    Alcohol/week: 2.0 standard drinks    Types: 2 Glasses of wine per week  . Drug use: No  . Sexual activity: Never    Birth control/protection: Post-menopausal  Lifestyle  . Physical activity:    Days per week: Not on file    Minutes per session: Not on file  . Stress: Not on file  Relationships  . Social connections:    Talks on phone: Not on file    Gets together: Not on file    Attends religious service: Not on file    Active member of club or organization: Not on file    Attends meetings of clubs or  organizations: Not on file    Relationship status: Not on file  Other Topics Concern  . Not on file  Social History Narrative   Has a son and daughter    Family History  Problem Relation Age of Onset  . Diabetes Mother   . Hypertension Mother   . Hyperlipidemia Mother   . Diabetes Father   . Cancer Father        prostate  . Hyperlipidemia Father   . Hypertension Father   . Diabetes Brother   . Peripheral Artery Disease Brother        has had toes amputated   - negative except otherwise stated in the family history section No Known Allergies Prior to Admission medications   Medication Sig Start Date End Date Taking? Authorizing Provider  acetaminophen (TYLENOL) 500 MG tablet Take 500-1,000 mg by mouth every 6 (six) hours as needed (for pain).   Yes [provider]  aspirin EC 81 MG tablet Take 81 mg by mouth daily as needed (chest pain).   Yes [provider]  calcium carbonate (TUMS - DOSED IN MG ELEMENTAL CALCIUM) 500 MG chewable tablet Chew 2 tablets (400 mg of elemental calcium total) by mouth 3 (three) times daily. Patient taking differently: Chew 2 tablets by mouth 2 (two) times daily.  09/18/15  Yes Love, Evlyn Kanner, PA-C  gabapentin (NEURONTIN) 400 MG capsule Take 400 mg by mouth See admin instructions. Take 400 mg by mouth two times a day, and an additional 400 mg as needed for nerve pain 12/01/17  Yes [provider]  HYDROcodone-acetaminophen (NORCO/VICODIN) 5-325 MG tablet Take 1 tablet by mouth every 6 (six) hours as needed for moderate pain. Patient taking differently: Take 1 tablet by mouth every 6 (six) hours as needed for moderate pain or severe pain.  03/08/17  Yes Montez Morita, PA-C  Menthol, Topical Analgesic, (ICY HOT EX) Apply 1 application topically 2 (two) times daily as needed (pain).   Yes [provider]  metFORMIN (GLUCOPHAGE) 500 MG tablet Take 1 tablet (500 mg total) by mouth daily with breakfast. 03/08/17 03/08/18 Yes Rhetta Mura, MD  Naphazoline HCl (CLEAR EYES OP) Place 1 drop into both eyes daily as needed (dry eyes/itching).   Yes [provider]  cholecalciferol 5000 units TABS Take 1 tablet (5,000 Units total) by mouth daily. Patient not taking: Reported on 01/25/2018 03/08/17   Montez Morita, PA-C  enoxaparin (LOVENOX) 40 MG/0.4ML injection Inject 0.4 mLs (40 mg total) into the skin daily. Patient not taking: Reported on 01/25/2018 03/08/17   Montez Morita, PA-C  Nutritional Supplements (FEEDING SUPPLEMENT, BOOST BREEZE,) LIQD Take 1 Bottle by mouth 3 (three) times daily with meals. Patient not taking: Reported on 01/25/2018 03/08/17  Montez Morita, PA-C   Dg Foot Complete Left  Result Date: 01/25/2018 CLINICAL DATA:  Diabetic foot ulcer. History of irrigation and drainage in 2012 and 2013. EXAM: LEFT FOOT - COMPLETE 3+ VIEW COMPARISON:  None. FINDINGS: The bones appear mildly demineralized. No evidence of acute fracture, dislocation or bone destruction. Minimal arthropathic changes are present within the great toe. There are prominent vascular calcifications consistent with underlying diabetes. No foreign body or soft tissue emphysema identified. Possible soft tissue ulceration over the plantar aspect of the forefoot, best seen on the lateral view. IMPRESSION: No radiographic evidence of osteomyelitis or foreign body. Electronically Signed   By: Carey Bullocks M.D.   On: 01/25/2018 18:15   Vas [place] Lower Extremity Venous (dvt)  Result Date: 01/26/2018  Lower Venous Study Indications: Pain, Swelling, and Erythema.  Performing Technologist: Toma Deiters RVS  Examination Guidelines: A complete evaluation includes B-mode imaging, spectral Doppler, color Doppler, and power Doppler as needed of all accessible portions of each vessel. Bilateral testing is considered an integral part of a complete examination. Limited examinations for reoccurring indications may be performed as noted.  Right Venous Findings:  +---------+---------------+---------+-----------+----------+-------+          CompressibilityPhasicitySpontaneityPropertiesSummary +---------+---------------+---------+-----------+----------+-------+ CFV      Full           Yes      Yes                          +---------+---------------+---------+-----------+----------+-------+ SFJ      Full                                                 +---------+---------------+---------+-----------+----------+-------+ FV Prox  Full           Yes      Yes                          +---------+---------------+---------+-----------+----------+-------+ FV Mid   Full                                                 +---------+---------------+---------+-----------+----------+-------+ FV DistalFull           Yes      Yes                          +---------+---------------+---------+-----------+----------+-------+ PFV      Full           Yes      Yes                          +---------+---------------+---------+-----------+----------+-------+ POP      Full           Yes      Yes                          +---------+---------------+---------+-----------+----------+-------+ PTV      Full                                                 +---------+---------------+---------+-----------+----------+-------+  PERO     Full                                                 +---------+---------------+---------+-----------+----------+-------+  Right Technical Findings: Enlargement of the inguinal lymph nodes noted  Left Venous Findings: +---+---------------+---------+-----------+----------+-------+    CompressibilityPhasicitySpontaneityPropertiesSummary +---+---------------+---------+-----------+----------+-------+ CFVFull           Yes      Yes                          +---+---------------+---------+-----------+----------+-------+ SFJFull                                                  +---+---------------+---------+-----------+----------+-------+    Summary: Right: There is no evidence of deep vein thrombosis in the lower extremity. No cystic structure found in the popliteal fossa. See technical findings listed above Left: There is no evidence of a common femoral vein obstruction.  *See table(s) above for measurements and observations. Electronically signed by Sherald Hesshristopher Clark MD on 01/26/2018 at 2:07:24 PM.    Final    - pertinent xrays, CT, MRI studies were reviewed and independently interpreted  Positive ROS: All other systems have been reviewed and were otherwise negative with the exception of those mentioned in the HPI and as above.  Physical Exam: General: Alert, no acute distress Psychiatric: Patient is competent for consent with normal mood and affect Lymphatic: No axillary or cervical lymphadenopathy Cardiovascular: No pedal edema Respiratory: No cyanosis, no use of accessory musculature GI: No organomegaly, abdomen is soft and non-tender    Images:  @ENCIMAGES @  Labs:  Lab Results  Component Value Date   HGBA1C 7.5 (H) 01/25/2018   HGBA1C 5.4 03/03/2017   HGBA1C 6.5 (H) 11/25/2015   ESRSEDRATE 108 (H) 08/24/2015   ESRSEDRATE >140 (H) 11/28/2010   CRP 5.1 (H) 08/24/2015   CRP 4.27 (H) 11/28/2010   REPTSTATUS PENDING 01/25/2018   GRAMSTAIN  11/24/2010    FEW WBC PRESENT, PREDOMINANTLY PMN FEW GRAM POSITIVE COCCI IN PAIRS FEW GRAM POSITIVE RODS RARE GRAM NEGATIVE RODS   GRAMSTAIN  11/24/2010    FEW WBC PRESENT, PREDOMINANTLY PMN FEW GRAM POSITIVE COCCI IN PAIRS FEW GRAM POSITIVE RODS   GRAMSTAIN  11/24/2010    RARE WBC PRESENT, PREDOMINANTLY PMN NO ORGANISMS SEEN   GRAMSTAIN  11/24/2010    RARE WBC PRESENT, PREDOMINANTLY PMN NO ORGANISMS SEEN   CULT  01/25/2018    NO GROWTH < 24 HOURS Performed at [facility], [address] [place], [place] [address]    Imelda PillowLABORGA ENTEROCOCCUS FAECALIS (A) 11/25/2015    Lab Results  Component  Value Date   ALBUMIN 3.2 (L) 03/07/2017   ALBUMIN 3.9 03/02/2017   ALBUMIN 4.2 11/25/2015   PREALBUMIN 13.2 (L) 03/04/2017    Neurologic: Patient does not have protective sensation bilateral lower extremities.   MUSCULOSKELETAL:   Skin: Examination patient has an ulcer plantarly and medially to the MTP joint of the left great toe.  There is cellulitis in the left calf.  After informed consent scissors and pickups were used to unroofed the ulcer medially as well as excise the callus.  There was fluctuance felt and this was  opened up deep.  There was some necrotic tendinous tissue there was no abscess no drainage no signs of any deep infection.  After debridement the wound is 3 x 5 cm and 5 mm deep.  This was then covered with a dry dressing.  Patient has peripheral vascular disease with calcification of the arteries down into the foot.  She has good capillary refill in the left foot.  Review of the radiographs shows no evidence of osteomyelitis in the sesamoids or the metatarsal head.  Assessment: Assessment: Diabetic insensate neuropathy with cellulitis of the left leg with a ulcer beneath the left great toe MTP joint with no sign of abscess.  Plan: Plan: Will have the dressing changed daily recommended weightbearing on her heel only with no weightbearing on the forefoot.  Continue IV antibiotics for the cellulitis in the left leg and I will follow-up for the left foot wound in the office in 1 week.  Thank you for the consult and the opportunity to see Ms. Pete GlatterMartin  Marcus Duda, MD [facility] [phone] 12:11 PM

## 2018-01-27 NOTE — Progress Notes (Signed)
In room with pt. Pt stated that she had a slight choking incident, but that it was not on food,; she feels like she had dozed off while eating and that she woke up coughing, states that she "felt like she was choking" and "threw-up" thick saliva into her lunch plate. Pt states that she has had a hard time swallowing in the past, she stated that she told the MD wrong because she had too much going on at the time that the MD called.

## 2018-01-27 NOTE — Progress Notes (Signed)
Orthopedic Tech Progress Note Patient Details:  [name] 1965/03/11 [id number]  Ortho Devices Type of Ortho Device: Postop shoe/boot Ortho Device/Splint Location: left Ortho Device/Splint Interventions: Adjustment, Application, Ordered   Post Interventions Patient Tolerated: Well Instructions Provided: Care of device, Adjustment of device   Donald Pore 01/27/2018, 12:29 PM

## 2018-01-28 LAB — CBC
HCT: 30.4 % — ABNORMAL LOW (ref 36.0–46.0)
Hemoglobin: 9.1 g/dL — ABNORMAL LOW (ref 12.0–15.0)
MCH: 29.1 pg (ref 26.0–34.0)
MCHC: 29.9 g/dL — ABNORMAL LOW (ref 30.0–36.0)
MCV: 97.1 fL (ref 80.0–100.0)
Platelets: 62 10*3/uL — ABNORMAL LOW (ref 150–400)
RBC: 3.13 MIL/uL — ABNORMAL LOW (ref 3.87–5.11)
RDW: 12.9 % (ref 11.5–15.5)
WBC: 4.1 10*3/uL (ref 4.0–10.5)
nRBC: 0 % (ref 0.0–0.2)

## 2018-01-28 LAB — BASIC METABOLIC PANEL
Anion gap: 8 (ref 5–15)
Anion gap: 10 (ref 5–15)
BUN: 31 mg/dL — ABNORMAL HIGH (ref 6–20)
BUN: 29 mg/dL — ABNORMAL HIGH (ref 6–20)
CO2: 24 mmol/L (ref 22–32)
CO2: 24 mmol/L (ref 22–32)
Calcium: 8.3 mg/dL — ABNORMAL LOW (ref 8.9–10.3)
Calcium: 8.4 mg/dL — ABNORMAL LOW (ref 8.9–10.3)
Chloride: 101 mmol/L (ref 98–111)
Chloride: 103 mmol/L (ref 98–111)
Creatinine, Ser: 2.01 mg/dL — ABNORMAL HIGH (ref 0.44–1.00)
Creatinine, Ser: 1.82 mg/dL — ABNORMAL HIGH (ref 0.44–1.00)
GFR calc Af Amer: 32 mL/min — ABNORMAL LOW (ref >60)
GFR calc Af Amer: 37 mL/min — ABNORMAL LOW (ref >60)
GFR calc non Af Amer: 28 mL/min — ABNORMAL LOW (ref >60)
GFR calc non Af Amer: 32 mL/min — ABNORMAL LOW (ref >60)
Glucose, Bld: 239 mg/dL — ABNORMAL HIGH (ref 70–99)
Glucose, Bld: 183 mg/dL — ABNORMAL HIGH (ref 70–99)
Potassium: 4.7 mmol/L (ref 3.5–5.1)
Potassium: 4.6 mmol/L (ref 3.5–5.1)
Sodium: 133 mmol/L — ABNORMAL LOW (ref 135–145)
Sodium: 137 mmol/L (ref 135–145)

## 2018-01-28 LAB — GLUCOSE, CAPILLARY
Glucose-Capillary: 196 mg/dL — ABNORMAL HIGH (ref 70–99)
Glucose-Capillary: 203 mg/dL — ABNORMAL HIGH (ref 70–99)
Glucose-Capillary: 122 mg/dL — ABNORMAL HIGH (ref 70–99)
Glucose-Capillary: 140 mg/dL — ABNORMAL HIGH (ref 70–99)

## 2018-01-28 LAB — URINALYSIS, ROUTINE W REFLEX MICROSCOPIC
Bacteria, UA: NONE SEEN
Bilirubin Urine: NEGATIVE
Glucose, UA: NEGATIVE mg/dL
Ketones, ur: NEGATIVE mg/dL
Leukocytes, UA: NEGATIVE
Nitrite: NEGATIVE
Protein, ur: NEGATIVE mg/dL
Specific Gravity, Urine: 1.005 (ref 1.005–1.030)
pH: 5 (ref 5.0–8.0)

## 2018-01-28 MED ORDER — SODIUM CHLORIDE 0.9 % IV SOLN
INTRAVENOUS | Status: DC
  Administered 2018-01-28 – 2018-01-30 (×2): via INTRAVENOUS

## 2018-01-28 NOTE — Progress Notes (Addendum)
PROGRESS NOTE    [name]   ZOX:[id number]  DOB: 04/12/1965  DOA: 01/25/2018 PCP: [name], [name], PA-C   Brief Narrative:  Cheryl HansenMichelle L Wolf   is a 53 y.o. female with medical history significant for type 2 diabetes with neuropathy, hypertension, history of CVA,  right BKA who is wheelchair-bound and cataracts. She presents with left foot and leg swelling with erythema and pain. She also has a callus on the same foot near her metatarsal head.    Subjective: No vomiting today. Tolerating diet.    Assessment & Plan:   Principal Problem:   Cellulitis of left lower extremity- neuropathy with significant loss of sensation in left foot - infection may be related to the callus which appears quite deep  - she also had an area on the dorsum of her 1st toe where a bullae has ruptured - Dr Lajoyce Cornersuda consulted yesterday - he debrided the foot at bedside- she has an open wound and dressing are being done daily- weight bearing on heel only- f/u with Dr Lajoyce Cornersuda in 1 wk in office - fever 101 - has improved to 98-99 - cont Ceftriaxone for now increased dose to 2 gm/ day on 1/25 - foot and most of leg below the knee involved-  Very slow improvement - need to continue IV antibiotics today - continue to elevate foot - xray of foot unrevealing for osteomyelitis or foreign body -   blood cultures negative   Active Problems:   Hypertension - in ED- ? Due to pain- BP now on low side    Diabetes mellitus with neuropathy - does not check sugars and states glucometer is broken - on Glucophage at home -   A1c 7.5- will need to increase Glucophage to 1000 mg BID when discharged - cont SSI - cont Gabapentin    CKD (chronic kidney disease), stage 3 - Cr elevated today at 2.01 - repeat was 1.82- cause undetermined- she is not on any medications that would affect her renal function- no hypotension noted- appears adequately hydrated but did vomit yesterday  - check UA and start NS infusion   Hyperkalemia - K 5.5 on admission- has resolved  B/l cataract-poor visual acuity - she states this is the reason why her vision is so poor- she saw an ophthalmologist last month and is yet to schedule surgery  Time spent in minutes: 35 DVT prophylaxis: Heparin Code Status: full code Family Communication:   Disposition Plan: cont to treat infection Consultants:   none Procedures:   none Antimicrobials:  Anti-infectives (From admission, onward)   Start     Dose/Rate Route Frequency Ordered Stop   01/28/18 0600  cefTRIAXone (ROCEPHIN) 2 g in sodium chloride 0.9 % 100 mL IVPB     2 g 200 mL/hr over 30 Minutes Intravenous Every 24 hours 01/27/18 1002     01/27/18 1015  cefTRIAXone (ROCEPHIN) 1 g in sodium chloride 0.9 % 100 mL IVPB     1 g 200 mL/hr over 30 Minutes Intravenous  Once 01/27/18 1003 01/27/18 1340   01/26/18 1800  vancomycin (VANCOCIN) IVPB 1000 mg/200 mL premix  Status:  Discontinued     1,000 mg 200 mL/hr over 60 Minutes Intravenous Every 24 hours 01/25/18 1859 01/25/18 1956   01/26/18 0600  cefTRIAXone (ROCEPHIN) 1 g in sodium chloride 0.9 % 100 mL IVPB  Status:  Discontinued     1 g 200 mL/hr over 30 Minutes Intravenous Every 24 hours 01/25/18 1958 01/27/18 1002  01/25/18 1800  vancomycin (VANCOCIN) 1,500 mg in sodium chloride 0.9 % 500 mL IVPB  Status:  Discontinued     1,500 mg 250 mL/hr over 120 Minutes Intravenous  Once 01/25/18 1755 01/25/18 1956   01/25/18 1745  metroNIDAZOLE (FLAGYL) IVPB 500 mg  Status:  Discontinued     500 mg 100 mL/hr over 60 Minutes Intravenous  Once 01/25/18 1740 01/25/18 2020   01/25/18 1745  ceFEPIme (MAXIPIME) 2 g in sodium chloride 0.9 % 100 mL IVPB  Status:  Discontinued     2 g 200 mL/hr over 30 Minutes Intravenous Every 8 hours 01/25/18 1740 01/25/18 1956       Objective: Vitals:   01/27/18 1529 01/27/18 1946 01/28/18 0030 01/28/18 0442  BP: (!) 148/75 (!) 120/55  125/63  Pulse: 86 86  86  Resp: 18     Temp: 98.2 F  (36.8 C) 99.1 F (37.3 C)  98.7 F (37.1 C)  TempSrc: Oral Oral  Oral  SpO2: 99% 98% 97% 96%  Weight:      Height:        Intake/Output Summary (Last 24 hours) at 01/28/2018 1423 Last data filed at 01/28/2018 0800 Gross per 24 hour  Intake 600 ml  Output -  Net 600 ml   Filed Weights   01/25/18 1700 01/26/18 0628  Weight: 80.2 kg 101.4 kg    Examination: General exam: Appears comfortable  HEENT:   oral mucosa moist, no sclera icterus or thrush- very poor eyesight due to bilateral cataracts Respiratory system: Clear to auscultation. Respiratory effort normal. Cardiovascular system: S1 & S2 heard, RRR.   Gastrointestinal system: Abdomen soft, non-tender, nondistended. Normal bowel sounds. Central nervous system: Alert and oriented. No focal neurological deficits. Extremities: No cyanosis, clubbing - left foot and leg remain erythematous, less swolling in leg today- right foot wound noted where debridement has been done- around the 1st toe- area is  bleeding when dressing opened - right BKA Psychiatry:  Mood & affect appropriate.     Data Reviewed: I have personally reviewed following labs and imaging studies  CBC: Recent Labs  Lab 01/25/18 1740 01/26/18 0330 01/28/18 0349  WBC 7.4 5.0 4.1  NEUTROABS 5.1  --   --   HGB 11.0* 10.0* 9.1*  HCT 35.8* 31.7* 30.4*  MCV 95.0 93.0 97.1  PLT PLATELET CLUMPS NOTED ON SMEAR, UNABLE TO ESTIMATE 79* 62*   Basic Metabolic Panel: Recent Labs  Lab 01/25/18 1740 01/26/18 0330 01/28/18 0349 01/28/18 0839  NA 130* 133* 133* 137  K 5.5* 4.4 4.7 4.6  CL 101 101 101 103  CO2 18* 21* 24 24  GLUCOSE 174* 179* 239* 183*  BUN 22* 23* 31* 29*  CREATININE 1.37* 1.37* 2.01* 1.82*  CALCIUM 8.4* 8.2* 8.3* 8.4*   GFR: Estimated Creatinine Clearance: 44.7 mL/min (A) (by C-G formula based on SCr of 1.82 mg/dL (H)). Liver Function Tests: No results for input(s): AST, ALT, ALKPHOS, BILITOT, PROT, ALBUMIN in the last 168 hours. No results  for input(s): LIPASE, AMYLASE in the last 168 hours. No results for input(s): AMMONIA in the last 168 hours. Coagulation Profile: No results for input(s): INR, PROTIME in the last 168 hours. Cardiac Enzymes: No results for input(s): CKTOTAL, CKMB, CKMBINDEX, TROPONINI in the last 168 hours. BNP (last 3 results) No results for input(s): PROBNP in the last 8760 hours. HbA1C: Recent Labs    01/25/18 2030  HGBA1C 7.5*   CBG: Recent Labs  Lab 01/27/18 1148 01/27/18 1600 01/27/18  1943 01/28/18 0631 01/28/18 1125  GLUCAP 216* 186* 170* 196* 203*   Lipid Profile: No results for input(s): CHOL, HDL, LDLCALC, TRIG, CHOLHDL, LDLDIRECT in the last 72 hours. Thyroid Function Tests: No results for input(s): TSH, T4TOTAL, FREET4, T3FREE, THYROIDAB in the last 72 hours. Anemia Panel: No results for input(s): VITAMINB12, FOLATE, FERRITIN, TIBC, IRON, RETICCTPCT in the last 72 hours. Urine analysis:    Component Value Date/Time   COLORURINE YELLOW 03/04/2017 1257   APPEARANCEUR CLOUDY (A) 03/04/2017 1257   LABSPEC 1.012 03/04/2017 1257   PHURINE 5.0 03/04/2017 1257   GLUCOSEU NEGATIVE 03/04/2017 1257   HGBUR MODERATE (A) 03/04/2017 1257   BILIRUBINUR NEGATIVE 03/04/2017 1257   KETONESUR 5 (A) 03/04/2017 1257   PROTEINUR 100 (A) 03/04/2017 1257   UROBILINOGEN 2.0 (H) 07/08/2011 0051   NITRITE NEGATIVE 03/04/2017 1257   LEUKOCYTESUR MODERATE (A) 03/04/2017 1257   Sepsis Labs: @LABRCNTIP (procalcitonin:4,lacticidven:4) ) Recent Results (from the past 240 hour(s))  Blood culture (routine x 2)     Status: None (Preliminary result)   Collection Time: 01/25/18  5:40 PM  Result Value Ref Range Status   Specimen Description BLOOD LEFT ANTECUBITAL  Final   Special Requests   Final    BOTTLES DRAWN AEROBIC AND ANAEROBIC Blood Culture adequate volume   Culture   Final    NO GROWTH 3 DAYS Performed at [facility], [address], [place], [place] [address]    Report Status PENDING   Incomplete  Blood culture (routine x 2)     Status: None (Preliminary result)   Collection Time: 01/25/18  5:53 PM  Result Value Ref Range Status   Specimen Description BLOOD BLOOD RIGHT FOREARM  Final   Special Requests   Final    BOTTLES DRAWN AEROBIC AND ANAEROBIC Blood Culture results may not be optimal due to an inadequate volume of blood received in culture bottles   Culture   Final    NO GROWTH 3 DAYS Performed at [facility], [address] [place], [place] [address]    Report Status PENDING  Incomplete         Radiology Studies: No results found.    Scheduled Meds: . gabapentin  400 mg Oral BID  . heparin  5,000 Units Subcutaneous Q8H  . insulin aspart  0-9 Units Subcutaneous TID WC  . povidone-iodine   Topical BID  . sodium chloride flush  3 mL Intravenous Q12H   Continuous Infusions: . cefTRIAXone (ROCEPHIN)  IV 2 g (01/28/18 0538)     LOS: 2 days      Calvert Cantor, MD [facility] Pager: [email or web address] Password [facility] 01/28/2018, 2:23 PM

## 2018-01-28 NOTE — Progress Notes (Signed)
PT got orders for NS at 18ml/hr at 1555, RN went to hang the fluid and flushed the midline and noticed that it was not flushing at all. Placed IV team consult at 1632, no one has came yet will place another and notify night shift RN. Will follow up

## 2018-01-28 NOTE — Plan of Care (Signed)
  Problem: Activity: Goal: Risk for activity intolerance will decrease Outcome: Progressing   Problem: Coping: Goal: Level of anxiety will decrease Outcome: Progressing   Problem: Pain Managment: Goal: General experience of comfort will improve Outcome: Progressing   Problem: Safety: Goal: Ability to remain free from injury will improve Outcome: Progressing   Problem: Skin Integrity: Goal: Risk for impaired skin integrity will decrease Outcome: Progressing   

## 2018-01-28 NOTE — Plan of Care (Signed)
  Problem: Nutrition: Goal: Adequate nutrition will be maintained Outcome: Progressing   Problem: Coping: Goal: Level of anxiety will decrease Outcome: Progressing   Problem: Pain Managment: Goal: General experience of comfort will improve Outcome: Progressing   

## 2018-01-29 LAB — CBC
HCT: 28.8 % — ABNORMAL LOW (ref 36.0–46.0)
Hemoglobin: 9 g/dL — ABNORMAL LOW (ref 12.0–15.0)
MCH: 30 pg (ref 26.0–34.0)
MCHC: 31.3 g/dL (ref 30.0–36.0)
MCV: 96 fL (ref 80.0–100.0)
Platelets: 71 10*3/uL — ABNORMAL LOW (ref 150–400)
RBC: 3 MIL/uL — ABNORMAL LOW (ref 3.87–5.11)
RDW: 13 % (ref 11.5–15.5)
WBC: 3.9 10*3/uL — ABNORMAL LOW (ref 4.0–10.5)
nRBC: 0 % (ref 0.0–0.2)

## 2018-01-29 LAB — BASIC METABOLIC PANEL
Anion gap: 9 (ref 5–15)
BUN: 28 mg/dL — ABNORMAL HIGH (ref 6–20)
CO2: 22 mmol/L (ref 22–32)
Calcium: 8.6 mg/dL — ABNORMAL LOW (ref 8.9–10.3)
Chloride: 108 mmol/L (ref 98–111)
Creatinine, Ser: 1.71 mg/dL — ABNORMAL HIGH (ref 0.44–1.00)
GFR calc Af Amer: 39 mL/min — ABNORMAL LOW (ref >60)
GFR calc non Af Amer: 34 mL/min — ABNORMAL LOW (ref >60)
Glucose, Bld: 133 mg/dL — ABNORMAL HIGH (ref 70–99)
Potassium: 4.7 mmol/L (ref 3.5–5.1)
Sodium: 139 mmol/L (ref 135–145)

## 2018-01-29 LAB — GLUCOSE, CAPILLARY
Glucose-Capillary: 122 mg/dL — ABNORMAL HIGH (ref 70–99)
Glucose-Capillary: 214 mg/dL — ABNORMAL HIGH (ref 70–99)
Glucose-Capillary: 129 mg/dL — ABNORMAL HIGH (ref 70–99)
Glucose-Capillary: 157 mg/dL — ABNORMAL HIGH (ref 70–99)

## 2018-01-29 NOTE — Progress Notes (Signed)
PROGRESS NOTE    [name]   AVW:[id number]  DOB: 04/14/1965  DOA: 01/25/2018 PCP: [name], [name], PA-C   Brief Narrative:  [name]   is a 53 y.o. female with medical history significant for type 2 diabetes with neuropathy, hypertension, history of CVA,  right BKA who is wheelchair-bound and cataracts. She presents with left foot and leg swelling with erythema and pain. She also has a callus on the same foot near her metatarsal head.    Subjective: No complaints today.     Assessment & Plan:   Principal Problem:   Cellulitis of left lower extremity- neuropathy with significant loss of sensation in left foot - infection may be related to the callus which appears quite deep  - she also had an area on the dorsum of her 1st toe where a bullae has ruptured - Dr Lajoyce Cornersuda consulted yesterday - he debrided the foot at bedside- she has an open wound and dressing are being done daily- weight bearing on heel only- f/u with Dr Lajoyce Cornersuda in 1 wk in office - fever 101 - has improved to 98-99 - cont Ceftriaxone for now increased dose to 2 gm/ day on 1/25 - foot and most of leg below the knee involved- leg still not much improved- foot is improving-  continue IV antibiotics today- - continue to elevate foot - xray of foot unrevealing for osteomyelitis or foreign body -   blood cultures negative   Active Problems:   Hypertension - in ED- ? Due to pain- BP now on low side    Diabetes mellitus with neuropathy - does not check sugars and states glucometer is broken - on Glucophage at home -   A1c 7.5- will need to increase Glucophage to 1000 mg BID when discharged - cont SSI - cont Gabapentin    CKD (chronic kidney disease), stage 3 - Cr elevated today at 2.01 - repeat was 1.82- cause undetermined- she is not on any medications that would affect her renal function- no hypotension noted  - checked UA which did no reveal any casts- ordered NS infusion but due to IV issues it was  not started until late last night- follow Cr    Hyperkalemia - K 5.5 on admission- has resolved  B/l cataract-poor visual acuity - she states this is the reason why her vision is so poor- she saw an ophthalmologist last month and is yet to schedule surgery  Time spent in minutes: 35 DVT prophylaxis: Heparin Code Status: full code Family Communication:   Disposition Plan: cont to treat infection Consultants:   none Procedures:   none Antimicrobials:  Anti-infectives (From admission, onward)   Start     Dose/Rate Route Frequency Ordered Stop   01/28/18 0600  cefTRIAXone (ROCEPHIN) 2 g in sodium chloride 0.9 % 100 mL IVPB     2 g 200 mL/hr over 30 Minutes Intravenous Every 24 hours 01/27/18 1002     01/27/18 1015  cefTRIAXone (ROCEPHIN) 1 g in sodium chloride 0.9 % 100 mL IVPB     1 g 200 mL/hr over 30 Minutes Intravenous  Once 01/27/18 1003 01/27/18 1340   01/26/18 1800  vancomycin (VANCOCIN) IVPB 1000 mg/200 mL premix  Status:  Discontinued     1,000 mg 200 mL/hr over 60 Minutes Intravenous Every 24 hours 01/25/18 1859 01/25/18 1956   01/26/18 0600  cefTRIAXone (ROCEPHIN) 1 g in sodium chloride 0.9 % 100 mL IVPB  Status:  Discontinued  1 g 200 mL/hr over 30 Minutes Intravenous Every 24 hours 01/25/18 1958 01/27/18 1002   01/25/18 1800  vancomycin (VANCOCIN) 1,500 mg in sodium chloride 0.9 % 500 mL IVPB  Status:  Discontinued     1,500 mg 250 mL/hr over 120 Minutes Intravenous  Once 01/25/18 1755 01/25/18 1956   01/25/18 1745  metroNIDAZOLE (FLAGYL) IVPB 500 mg  Status:  Discontinued     500 mg 100 mL/hr over 60 Minutes Intravenous  Once 01/25/18 1740 01/25/18 2020   01/25/18 1745  ceFEPIme (MAXIPIME) 2 g in sodium chloride 0.9 % 100 mL IVPB  Status:  Discontinued     2 g 200 mL/hr over 30 Minutes Intravenous Every 8 hours 01/25/18 1740 01/25/18 1956       Objective: Vitals:   01/28/18 1519 01/28/18 2015 01/29/18 0429 01/29/18 0500  BP: (!) 159/77 (!) 166/66 132/64    Pulse: 82 89 87   Resp: 16 16 14    Temp: 98.6 F (37 C) 98.3 F (36.8 C) 98.3 F (36.8 C)   TempSrc: Oral Oral Oral   SpO2: 100% 100% 97%   Weight:    107.7 kg  Height:        Intake/Output Summary (Last 24 hours) at 01/29/2018 1236 Last data filed at 01/29/2018 0900 Gross per 24 hour  Intake 1258.75 ml  Output 400 ml  Net 858.75 ml   Filed Weights   01/25/18 1700 01/26/18 0628 01/29/18 0500  Weight: 80.2 kg 101.4 kg 107.7 kg    Examination: General exam: Appears comfortable  HEENT:   oral mucosa moist, no sclera icterus or thrush- very poor eyesight due to bilateral cataracts Respiratory system: Clear to auscultation. Respiratory effort normal. Cardiovascular system: S1 & S2 heard, RRR.   Gastrointestinal system: Abdomen soft, non-tender, nondistended. Normal bowel sounds. Central nervous system: Alert and oriented. No focal neurological deficits. Extremities: No cyanosis, clubbing - left foot and leg remain erythematous, less swolling in leg today- right foot wound noted where debridement has been done- around the 1st toe- area is  bleeding when dressing opened         Psychiatry:  Mood & affect appropriate.     Data Reviewed: I have personally reviewed following labs and imaging studies  CBC: Recent Labs  Lab 01/25/18 1740 01/26/18 0330 01/28/18 0349 01/29/18 0525  WBC 7.4 5.0 4.1 3.9*  NEUTROABS 5.1  --   --   --   HGB 11.0* 10.0* 9.1* 9.0*  HCT 35.8* 31.7* 30.4* 28.8*  MCV 95.0 93.0 97.1 96.0  PLT PLATELET CLUMPS NOTED ON SMEAR, UNABLE TO ESTIMATE 79* 62* 71*   Basic Metabolic Panel: Recent Labs  Lab 01/25/18 1740 01/26/18 0330 01/28/18 0349 01/28/18 0839 01/29/18 0525  NA 130* 133* 133* 137 139  K 5.5* 4.4 4.7 4.6 4.7  CL 101 101 101 103 108  CO2 18* 21* 24 24 22   GLUCOSE 174* 179* 239* 183* 133*  BUN 22* 23* 31* 29* 28*  CREATININE 1.37* 1.37* 2.01* 1.82* 1.71*  CALCIUM 8.4* 8.2* 8.3* 8.4* 8.6*   GFR: Estimated Creatinine Clearance:  49.2 mL/min (A) (by C-G formula based on SCr of 1.71 mg/dL (H)). Liver Function Tests: No results for input(s): AST, ALT, ALKPHOS, BILITOT, PROT, ALBUMIN in the last 168 hours. No results for input(s): LIPASE, AMYLASE in the last 168 hours. No results for input(s): AMMONIA in the last 168 hours. Coagulation Profile: No results for input(s): INR, PROTIME in the last 168 hours. Cardiac Enzymes: No results  for input(s): CKTOTAL, CKMB, CKMBINDEX, TROPONINI in the last 168 hours. BNP (last 3 results) No results for input(s): PROBNP in the last 8760 hours. HbA1C: No results for input(s): HGBA1C in the last 72 hours. CBG: Recent Labs  Lab 01/28/18 1125 01/28/18 1642 01/28/18 2028 01/29/18 0654 01/29/18 1141  GLUCAP 203* 122* 140* 122* 214*   Lipid Profile: No results for input(s): CHOL, HDL, LDLCALC, TRIG, CHOLHDL, LDLDIRECT in the last 72 hours. Thyroid Function Tests: No results for input(s): TSH, T4TOTAL, FREET4, T3FREE, THYROIDAB in the last 72 hours. Anemia Panel: No results for input(s): VITAMINB12, FOLATE, FERRITIN, TIBC, IRON, RETICCTPCT in the last 72 hours. Urine analysis:    Component Value Date/Time   COLORURINE STRAW (A) 01/28/2018 1739   APPEARANCEUR CLEAR 01/28/2018 1739   LABSPEC 1.005 01/28/2018 1739   PHURINE 5.0 01/28/2018 1739   GLUCOSEU NEGATIVE 01/28/2018 1739   HGBUR SMALL (A) 01/28/2018 1739   BILIRUBINUR NEGATIVE 01/28/2018 1739   KETONESUR NEGATIVE 01/28/2018 1739   PROTEINUR NEGATIVE 01/28/2018 1739   UROBILINOGEN 2.0 (H) 07/08/2011 0051   NITRITE NEGATIVE 01/28/2018 1739   LEUKOCYTESUR NEGATIVE 01/28/2018 1739   Sepsis Labs: @LABRCNTIP (procalcitonin:4,lacticidven:4) ) Recent Results (from the past 240 hour(s))  Blood culture (routine x 2)     Status: None (Preliminary result)   Collection Time: 01/25/18  5:40 PM  Result Value Ref Range Status   Specimen Description BLOOD LEFT ANTECUBITAL  Final   Special Requests   Final    BOTTLES DRAWN  AEROBIC AND ANAEROBIC Blood Culture adequate volume   Culture   Final    NO GROWTH 4 DAYS Performed at [facility], [address] [place], [place] [address]    Report Status PENDING  Incomplete  Blood culture (routine x 2)     Status: None (Preliminary result)   Collection Time: 01/25/18  5:53 PM  Result Value Ref Range Status   Specimen Description BLOOD BLOOD RIGHT FOREARM  Final   Special Requests   Final    BOTTLES DRAWN AEROBIC AND ANAEROBIC Blood Culture results may not be optimal due to an inadequate volume of blood received in culture bottles   Culture   Final    NO GROWTH 4 DAYS Performed at [facility], [address] [place], [place] [address]    Report Status PENDING  Incomplete         Radiology Studies: No results found.    Scheduled Meds: . gabapentin  400 mg Oral BID  . heparin  5,000 Units Subcutaneous Q8H  . insulin aspart  0-9 Units Subcutaneous TID WC  . povidone-iodine   Topical BID  . sodium chloride flush  3 mL Intravenous Q12H   Continuous Infusions: . sodium chloride 75 mL/hr at 01/28/18 2252  . cefTRIAXone (ROCEPHIN)  IV 2 g (01/29/18 0629)     LOS: 3 days      Calvert Cantor, MD [facility] Pager: [email or web address] Password [facility] 01/29/2018, 12:36 PM

## 2018-01-30 DIAGNOSIS — D696 Thrombocytopenia, unspecified: Secondary | ICD-10-CM

## 2018-01-30 DIAGNOSIS — N179 Acute kidney failure, unspecified: Secondary | ICD-10-CM

## 2018-01-30 DIAGNOSIS — I10 Essential (primary) hypertension: Secondary | ICD-10-CM

## 2018-01-30 LAB — BASIC METABOLIC PANEL
Anion gap: 7 (ref 5–15)
BUN: 26 mg/dL — ABNORMAL HIGH (ref 6–20)
CO2: 21 mmol/L — ABNORMAL LOW (ref 22–32)
Calcium: 8.6 mg/dL — ABNORMAL LOW (ref 8.9–10.3)
Chloride: 110 mmol/L (ref 98–111)
Creatinine, Ser: 1.42 mg/dL — ABNORMAL HIGH (ref 0.44–1.00)
GFR calc Af Amer: 49 mL/min — ABNORMAL LOW (ref >60)
GFR calc non Af Amer: 43 mL/min — ABNORMAL LOW (ref >60)
Glucose, Bld: 133 mg/dL — ABNORMAL HIGH (ref 70–99)
Potassium: 5 mmol/L (ref 3.5–5.1)
Sodium: 138 mmol/L (ref 135–145)

## 2018-01-30 LAB — CULTURE, BLOOD (ROUTINE X 2)
Culture: NO GROWTH
Culture: NO GROWTH
Special Requests: ADEQUATE

## 2018-01-30 LAB — GLUCOSE, CAPILLARY
Glucose-Capillary: 137 mg/dL — ABNORMAL HIGH (ref 70–99)
Glucose-Capillary: 168 mg/dL — ABNORMAL HIGH (ref 70–99)
Glucose-Capillary: 135 mg/dL — ABNORMAL HIGH (ref 70–99)

## 2018-01-30 MED ORDER — POVIDONE-IODINE 10 % EX SOLN: Freq: Two times a day (BID) | CUTANEOUS | 0 refills | Status: DC

## 2018-01-30 MED ORDER — DOXYCYCLINE HYCLATE 100 MG PO CAPS: 100.0000 mg | ORAL_CAPSULE | Freq: Two times a day (BID) | ORAL | 0 refills | Status: DC

## 2018-01-30 MED ORDER — METFORMIN HCL 500 MG PO TABS: 500.0000 mg | ORAL_TABLET | Freq: Two times a day (BID) | ORAL | 11 refills | Status: DC

## 2018-01-30 MED ORDER — DOXYCYCLINE HYCLATE 100 MG PO TABS
100.0000 mg | ORAL_TABLET | Freq: Two times a day (BID) | ORAL | Status: DC
  Administered 2018-01-30: 100 mg via ORAL
  Filled 2018-01-30: qty 1

## 2018-01-30 NOTE — Plan of Care (Signed)
Problem: Education: Goal: Knowledge of General Education information will improve Description Including pain rating scale, medication(s)/side effects and non-pharmacologic comfort measures Outcome: Progressing   Problem: Health Behavior/Discharge Planning: Goal: Ability to manage health-related needs will improve Outcome: Progressing   Problem: Clinical Measurements: Goal: Respiratory complications will improve Outcome: Progressing   Problem: Nutrition: Goal: Adequate nutrition will be maintained Outcome: Progressing   Problem: Coping: Goal: Level of anxiety will decrease Outcome: Progressing   Problem: Elimination: Goal: Will not experience complications related to urinary retention Outcome: Progressing   Problem: Pain Managment: Goal: General experience of comfort will improve Outcome: Progressing   Problem: Safety: Goal: Ability to remain free from injury will improve Outcome: Progressing   Problem: Skin Integrity: Goal: Risk for impaired skin integrity will decrease Outcome: Progressing   

## 2018-01-30 NOTE — Discharge Summary (Signed)
Physician Discharge Summary  Cheryl HansenMichelle L Wolf WRU:[id number] DOB: 03/27/1965 DOA: 01/25/2018  PCP: [name], [name], PA-C  Admit date: 01/25/2018 Discharge date: 01/30/2018  Admitted From: home Disposition:  home   Recommendations for Outpatient Follow-up:  1. Will f/u with Dr Lajoyce Cornersuda in 1 wk  Home Health:  ordered  Discharge Condition:  stable   CODE STATUS:  Full code Diet recommendation:   Consultations:  ortho   Discharge Diagnoses:  Principal Problem:   Cellulitis of left lower extremity Active Problems:   Hypertension   Diabetes mellitus (HCC)   Thrombocytopenia (HCC)   Hyperkalemia   CKD (chronic kidney disease) stage 3, GFR 30-59 ml/min (HCC)    Brief Summary: Cheryl HansenMichelle L Wolf  is a 53 y.o.femalewith medical history significant fortype 2 diabetes with neuropathy, hypertension, history of CVA,  right BKA who is wheelchair-bound and cataracts. She presents with left foot and leg swelling with erythema and pain. She also has a callus on the same foot near her metatarsal head.   Hospital Course:  Principal Problem:   Cellulitis of left lower extremity- neuropathy with significant loss of sensation in left foot - infection may be related to the callus which appears quite deep  - she also had an area on the dorsum of her 1st toe where a bullae has ruptured - xray of foot unrevealing for osteomyelitis or foreign body -   blood cultures negative - fever 101 - has improved to 98-99 - Dr Lajoyce Cornersuda consulted - he debrided the foot at bedside- she has an open wound and dressing are being done daily- weight bearing on heel only- f/u with Dr Lajoyce Cornersuda in 1 wk in office - cont Ceftriaxone for now increased dose to 2 gm/ day on 1/25 - foot and most of leg below the knee involved- she has received 6 days of IV antibiotics- will transition to oral antibiotics now     Active Problems:   Hypertension - in ED- ? Due to pain- BP now on low side    Diabetes mellitus with  neuropathy - does not check sugars and states glucometer is broken - on Glucophage at home -   A1c 7.5- will need to increase Glucophage to 1000 mg BID when discharged - cont SSI - cont Gabapentin    CKD (chronic kidney disease), stage 3 -baseline is about 1.3-1.4 - Cr elevated to 2.01 - repeat was 1.82- possibly due dehydration as she had vomited the day before - checked UA which did no reveal any casts- ordered NS infusion and Cr subsequently improvedto 1.42    Hyperkalemia - K 5.5 on admission- has resolved  B/l cataract-poor visual acuity - - she saw an ophthalmologist last month and is yet to schedule surgery for her cataracts- vision is quite poor  Discharge Exam: Vitals:   01/30/18 0359 01/30/18 1057  BP: (!) 148/74 132/60  Pulse: 95 87  Resp:    Temp: 99.7 F (37.6 C) 98.5 F (36.9 C)  SpO2: 94% 96%   Vitals:   01/29/18 1944 01/30/18 0359 01/30/18 0500 01/30/18 1057  BP: (!) 158/68 (!) 148/74  132/60  Pulse: 83 95  87  Resp:      Temp: 99.3 F (37.4 C) 99.7 F (37.6 C)  98.5 F (36.9 C)  TempSrc: Oral Oral  Oral  SpO2: 96% 94%  96%  Weight:   107.4 kg   Height:        General: Pt is alert, awake, not in acute distress Cardiovascular: RRR,  S1/S2 +, no rubs, no gallops Respiratory: CTA bilaterally, no wheezing, no rhonchi Abdominal: Soft, NT, ND, bowel sounds + Extremities: continues to have mild edema and erythema of her leg- foot not longer swollen or erythematous   Discharge Instructions  Discharge Instructions    Diet - low sodium heart healthy   Complete by:  As directed    Diet Carb Modified   Complete by:  As directed    Increase activity slowly   Complete by:  As directed    Touch down weight bearing   Complete by:  As directed    Laterality:  left   Extremity:  Lower     Allergies as of 01/30/2018   No Known Allergies     Medication List    STOP taking these medications   enoxaparin 40 MG/0.4ML injection Commonly known as:   LOVENOX- she was not taking this any more     TAKE these medications   acetaminophen 500 MG tablet Commonly known as:  TYLENOL Take 500-1,000 mg by mouth every 6 (six) hours as needed (for pain).   aspirin EC 81 MG tablet Take 81 mg by mouth daily as needed (chest pain).   calcium carbonate 500 MG chewable tablet Commonly known as:  TUMS - dosed in mg elemental calcium Chew 2 tablets (400 mg of elemental calcium total) by mouth 3 (three) times daily. What changed:  when to take this   Cholecalciferol 125 MCG (5000 UT) Tabs Take 1 tablet (5,000 Units total) by mouth daily.   CLEAR EYES OP Place 1 drop into both eyes daily as needed (dry eyes/itching).   doxycycline 100 MG capsule Commonly known as:  VIBRAMYCIN Take 1 capsule (100 mg total) by mouth 2 (two) times daily.   feeding supplement (BOOST BREEZE) Liqd Take 1 Bottle by mouth 3 (three) times daily with meals.   gabapentin 400 MG capsule Commonly known as:  NEURONTIN Take 400 mg by mouth See admin instructions. Take 400 mg by mouth two times a day, and an additional 400 mg as needed for nerve pain   HYDROcodone-acetaminophen 5-325 MG tablet Commonly known as:  NORCO/VICODIN Take 1 tablet by mouth every 6 (six) hours as needed for moderate pain. What changed:  reasons to take this   ICY HOT EX Apply 1 application topically 2 (two) times daily as needed (pain).   metFORMIN 500 MG tablet Commonly known as:  GLUCOPHAGE Take 1 tablet (500 mg total) by mouth 2 (two) times daily with a meal. What changed:  when to take this   povidone-iodine 10 % external solution Commonly known as:  BETADINE Apply topically 2 (two) times daily.            Discharge Care Instructions  (From admission, onward)         Start     Ordered   01/27/18 0000  Touch down weight bearing    Question Answer Comment  Laterality left   Extremity Lower      01/27/18 1219         Follow-up Information    Nadara Mustarduda, Marcus V, MD Follow  up in 1 week(s).   Specialty:  Orthopedic Surgery Contact information: [address] [place] [address] [phone]          No Known Allergies   Procedures/Studies: Debridement of left foot at bedside- Dr Lajoyce Cornersuda  Dg Foot Complete Left  Result Date: 01/25/2018 CLINICAL DATA:  Diabetic foot ulcer. History of irrigation and drainage in  2012 and 2013. EXAM: LEFT FOOT - COMPLETE 3+ VIEW COMPARISON:  None. FINDINGS: The bones appear mildly demineralized. No evidence of acute fracture, dislocation or bone destruction. Minimal arthropathic changes are present within the great toe. There are prominent vascular calcifications consistent with underlying diabetes. No foreign body or soft tissue emphysema identified. Possible soft tissue ulceration over the plantar aspect of the forefoot, best seen on the lateral view. IMPRESSION: No radiographic evidence of osteomyelitis or foreign body. Electronically Signed   By: [place] Bullocks M.D.   On: 01/25/2018 18:15   Vas [place] Lower Extremity Venous (dvt)  Result Date: 01/26/2018  Lower Venous Study Indications: Pain, Swelling, and Erythema.  Performing Technologist: Toma Deiters RVS  Examination Guidelines: A complete evaluation includes B-mode imaging, spectral Doppler, color Doppler, and power Doppler as needed of all accessible portions of each vessel. Bilateral testing is considered an integral part of a complete examination. Limited examinations for reoccurring indications may be performed as noted.  Right Venous Findings: +---------+---------------+---------+-----------+----------+-------+          CompressibilityPhasicitySpontaneityPropertiesSummary +---------+---------------+---------+-----------+----------+-------+ CFV      Full           Yes      Yes                          +---------+---------------+---------+-----------+----------+-------+ SFJ      Full                                                  +---------+---------------+---------+-----------+----------+-------+ FV Prox  Full           Yes      Yes                          +---------+---------------+---------+-----------+----------+-------+ FV Mid   Full                                                 +---------+---------------+---------+-----------+----------+-------+ FV DistalFull           Yes      Yes                          +---------+---------------+---------+-----------+----------+-------+ PFV      Full           Yes      Yes                          +---------+---------------+---------+-----------+----------+-------+ POP      Full           Yes      Yes                          +---------+---------------+---------+-----------+----------+-------+ PTV      Full                                                 +---------+---------------+---------+-----------+----------+-------+ PERO     Full                                                 +---------+---------------+---------+-----------+----------+-------+  Right Technical Findings: Enlargement of the inguinal lymph nodes noted  Left Venous Findings: +---+---------------+---------+-----------+----------+-------+    CompressibilityPhasicitySpontaneityPropertiesSummary +---+---------------+---------+-----------+----------+-------+ CFVFull           Yes      Yes                          +---+---------------+---------+-----------+----------+-------+ SFJFull                                                 +---+---------------+---------+-----------+----------+-------+    Summary: Right: There is no evidence of deep vein thrombosis in the lower extremity. No cystic structure found in the popliteal fossa. See technical findings listed above Left: There is no evidence of a common femoral vein obstruction.  *See table(s) above for measurements and observations. Electronically signed by Sherald Hess MD on 01/26/2018 at 2:07:24 PM.    Final       The results of significant diagnostics from this hospitalization (including imaging, microbiology, ancillary and laboratory) are listed below for reference.     Microbiology: Recent Results (from the past 240 hour(s))  Blood culture (routine x 2)     Status: None   Collection Time: 01/25/18  5:40 PM  Result Value Ref Range Status   Specimen Description BLOOD LEFT ANTECUBITAL  Final   Special Requests   Final    BOTTLES DRAWN AEROBIC AND ANAEROBIC Blood Culture adequate volume   Culture   Final    NO GROWTH 5 DAYS Performed at [facility], [address], [place], [place] [address]    Report Status 01/30/2018 FINAL  Final  Blood culture (routine x 2)     Status: None   Collection Time: 01/25/18  5:53 PM  Result Value Ref Range Status   Specimen Description BLOOD BLOOD RIGHT FOREARM  Final   Special Requests   Final    BOTTLES DRAWN AEROBIC AND ANAEROBIC Blood Culture results may not be optimal due to an inadequate volume of blood received in culture bottles   Culture   Final    NO GROWTH 5 DAYS Performed at [facility], [address], [place], [place] [address]    Report Status 01/30/2018 FINAL  Final     Labs: BNP (last 3 results) No results for input(s): BNP in the last 8760 hours. Basic Metabolic Panel: Recent Labs  Lab 01/26/18 0330 01/28/18 0349 01/28/18 0839 01/29/18 0525 01/30/18 0831  NA 133* 133* 137 139 138  K 4.4 4.7 4.6 4.7 5.0  CL 101 101 103 108 110  CO2 21* 24 24 22  21*  GLUCOSE 179* 239* 183* 133* 133*  BUN 23* 31* 29* 28* 26*  CREATININE 1.37* 2.01* 1.82* 1.71* 1.42*  CALCIUM 8.2* 8.3* 8.4* 8.6* 8.6*   Liver Function Tests: No results for input(s): AST, ALT, ALKPHOS, BILITOT, PROT, ALBUMIN in the last 168 hours. No results for input(s): LIPASE, AMYLASE in the last 168 hours. No results for input(s): AMMONIA in the last 168 hours. CBC: Recent Labs  Lab 01/25/18 1740 01/26/18 0330 01/28/18 0349 01/29/18 0525  WBC 7.4 5.0  4.1 3.9*  NEUTROABS 5.1  --   --   --   HGB 11.0* 10.0* 9.1* 9.0*  HCT 35.8* 31.7* 30.4* 28.8*  MCV 95.0 93.0 97.1 96.0  PLT PLATELET CLUMPS NOTED ON SMEAR, UNABLE TO  ESTIMATE 79* 62* 71*   Cardiac Enzymes: No results for input(s): CKTOTAL, CKMB, CKMBINDEX, TROPONINI in the last 168 hours. BNP: Invalid input(s): POCBNP CBG: Recent Labs  Lab 01/29/18 1141 01/29/18 1648 01/29/18 2137 01/30/18 0619 01/30/18 1157  GLUCAP 214* 129* 157* 137* 168*   D-Dimer No results for input(s): DDIMER in the last 72 hours. Hgb A1c No results for input(s): HGBA1C in the last 72 hours. Lipid Profile No results for input(s): CHOL, HDL, LDLCALC, TRIG, CHOLHDL, LDLDIRECT in the last 72 hours. Thyroid function studies No results for input(s): TSH, T4TOTAL, T3FREE, THYROIDAB in the last 72 hours.  Invalid input(s): FREET3 Anemia work up No results for input(s): VITAMINB12, FOLATE, FERRITIN, TIBC, IRON, RETICCTPCT in the last 72 hours. Urinalysis    Component Value Date/Time   COLORURINE STRAW (A) 01/28/2018 1739   APPEARANCEUR CLEAR 01/28/2018 1739   LABSPEC 1.005 01/28/2018 1739   PHURINE 5.0 01/28/2018 1739   GLUCOSEU NEGATIVE 01/28/2018 1739   HGBUR SMALL (A) 01/28/2018 1739   BILIRUBINUR NEGATIVE 01/28/2018 1739   KETONESUR NEGATIVE 01/28/2018 1739   PROTEINUR NEGATIVE 01/28/2018 1739   UROBILINOGEN 2.0 (H) 07/08/2011 0051   NITRITE NEGATIVE 01/28/2018 1739   LEUKOCYTESUR NEGATIVE 01/28/2018 1739   Sepsis Labs Invalid input(s): PROCALCITONIN,  WBC,  LACTICIDVEN Microbiology Recent Results (from the past 240 hour(s))  Blood culture (routine x 2)     Status: None   Collection Time: 01/25/18  5:40 PM  Result Value Ref Range Status   Specimen Description BLOOD LEFT ANTECUBITAL  Final   Special Requests   Final    BOTTLES DRAWN AEROBIC AND ANAEROBIC Blood Culture adequate volume   Culture   Final    NO GROWTH 5 DAYS Performed at [facility], [address] [place],  [place] [address]    Report Status 01/30/2018 FINAL  Final  Blood culture (routine x 2)     Status: None   Collection Time: 01/25/18  5:53 PM  Result Value Ref Range Status   Specimen Description BLOOD BLOOD RIGHT FOREARM  Final   Special Requests   Final    BOTTLES DRAWN AEROBIC AND ANAEROBIC Blood Culture results may not be optimal due to an inadequate volume of blood received in culture bottles   Culture   Final    NO GROWTH 5 DAYS Performed at [facility], [address] [place], [place] [address]    Report Status 01/30/2018 FINAL  Final     Time coordinating discharge in minutes: 65  SIGNED:   Calvert Cantor, MD  [facility] 01/30/2018, 3:12 PM Pager   If 7PM-7AM, please contact night-coverage [email or web address] Password [email or web address]

## 2018-01-30 NOTE — Evaluation (Signed)
Physical Therapy Evaluation Patient Details Name: Cheryl HansenMichelle L Wolf MRN: [id number] DOB: 01/06/1965 Today's Date: 01/30/2018   History of Present Illness  [age] y.o. female with medical history significant for type 2 diabetes with neuropathy, hypertension, history of CVA, history of right BKA now wheelchair-bound, CKD stage II, and cataracts who presents to the ED with complaint of blister to her left toe and chronic diabetic ulcer admitted with Diabetic insensate neuropathy with cellulitis of the left leg with a ulcer beneath the left great toe MTP joint with no sign of abscess. Bedside I&D performed 01/27/18.  Clinical Impression  PTA pt living alone on first floor of 2 story home with short threshold to ascend/descend to get into home. Pt independent in mobility in w/c with transfers using RW, and independent with iADLs. Pt only requires assist with transfer into/out of home and with transportation to shop and to appointments. Pt currently limited in safe mobility by decreased weightbearing on R LE and decreased sensation. Pt requires min guard for A-P transfer from w/c to bed and is independent with bed mobility. PT recommending HHPT level rehab at d/c to improve mobility. PT will continue to follow acutely.    Follow Up Recommendations Home health PT;Supervision for mobility/OOB    Equipment Recommendations  None recommended by PT    Recommendations for Other Services OT consult     Precautions / Restrictions Precautions Precautions: Fall Restrictions Weight Bearing Restrictions: Yes RLE Weight Bearing: Non weight bearing LLE Weight Bearing: Weight bearing as tolerated Other Position/Activity Restrictions: L LE WBAT only through heel      Mobility  Bed Mobility Overal bed mobility: Independent Bed Mobility: Sit to Supine           General bed mobility comments: independent with use of UE to move around bed   Transfers Overall transfer level: Needs assistance Equipment used:  Pushed w/c Transfers: Licensed conveyancerAnterior-Posterior Transfer       Anterior-Posterior transfers: Min guard   General transfer comment: on entry pt was sitting in w/c with L foot propped up on bed waiting for it to be redressed. Pt frustrated with not being able to get back to bed. Pt educated in performing an A-P transfer from w/c to bed and was able to perform with min guard         Balance Overall balance assessment: Needs assistance Sitting-balance support: Feet unsupported;No upper extremity supported Sitting balance-Leahy Scale: Normal         Standing balance comment: unable at this time                             Pertinent Vitals/Pain Pain Assessment: No/denies pain    Home Living Family/patient expects to be discharged to:: Private residence Living Arrangements: Alone   Type of Home: House Home Access: Stairs to enter   Entergy CorporationEntrance Stairs-Number of Steps: 1 small threshold  Home Layout: Two level;Able to live on main level with bedroom/bathroom Home Equipment: Dan HumphreysWalker - 2 wheels;Bedside commode;Wheelchair - manual      Prior Function Level of Independence: Independent with assistive device(s)         Comments: uses w/c mostly, and RW for transfers to toilet and with transfer over threshold into home from garage     Hand Dominance   Dominant Hand: Right    Extremity/Trunk Assessment   Upper Extremity Assessment Upper Extremity Assessment: Overall WFL for tasks assessed    Lower Extremity Assessment Lower Extremity Assessment:  RLE deficits/detail;LLE deficits/detail RLE Deficits / Details: R BKA LLE Deficits / Details: knee and hip ROM WFL, strength grossly 3+/5, ankle ROM limited by swelling  LLE Sensation: decreased light touch    Cervical / Trunk Assessment Cervical / Trunk Assessment: Normal  Communication   Communication: No difficulties  Cognition Arousal/Alertness: Awake/alert Behavior During Therapy: WFL for tasks  assessed/performed Overall Cognitive Status: Within Functional Limits for tasks assessed                                        General Comments General comments (skin integrity, edema, etc.): Pt found with ulcer undressed waiting on Kerlix gauze and RN to redress        Assessment/Plan    PT Assessment Patient needs continued PT services  PT Problem List Decreased mobility;Decreased activity tolerance;Impaired sensation       PT Treatment Interventions DME instruction;Functional mobility training;Therapeutic activities;Therapeutic exercise;Balance training;Wheelchair mobility training;Patient/family education    PT Goals (Current goals can be found in the Care Plan section)  Acute Rehab PT Goals Patient Stated Goal: be able to protect L foot PT Goal Formulation: With patient Time For Goal Achievement: 02/13/18 Potential to Achieve Goals: Good    Frequency Min 3X/week   Barriers to discharge Decreased caregiver support         AM-PAC PT "6 Clicks" Mobility  Outcome Measure Help needed turning from your back to your side while in a flat bed without using bedrails?: None Help needed moving from lying on your back to sitting on the side of a flat bed without using bedrails?: None Help needed moving to and from a bed to a chair (including a wheelchair)?: A Little Help needed standing up from a chair using your arms (e.g., wheelchair or bedside chair)?: A Lot Help needed to walk in hospital room?: Total Help needed climbing 3-5 steps with a railing? : Total 6 Click Score: 15    End of Session Equipment Utilized During Treatment: Gait belt Activity Tolerance: Patient tolerated treatment well Patient left: in bed;with call bell/phone within reach Nurse Communication: Mobility status;Weight bearing status PT Visit Diagnosis: Other abnormalities of gait and mobility (R26.89);Difficulty in walking, not elsewhere classified (R26.2)    Time: 9509-3267 PT Time  Calculation (min) (ACUTE ONLY): 26 min   Charges:   PT Evaluation $PT Eval Moderate Complexity: 1 Mod PT Treatments $Wheel Chair Management: 8-22 mins        Roisin Mones B. Beverely Risen PT, DPT Acute Rehabilitation Services Pager [phone] Office [phone]   Elon Alas Fleet 01/30/2018, 11:46 AM

## 2018-01-30 NOTE — Plan of Care (Signed)

## 2018-01-30 NOTE — Progress Notes (Signed)
AVS given and reviewed with pt. Medications reviewed and discussed. All questions answered to satisfaction. Pt escorted off the unit via personal wheelchair by staff member.

## 2018-02-06 ENCOUNTER — Encounter (INDEPENDENT_AMBULATORY_CARE_PROVIDER_SITE_OTHER): Payer: Self-pay | Admitting: Orthopedic Surgery

## 2018-02-06 ENCOUNTER — Ambulatory Visit (INDEPENDENT_AMBULATORY_CARE_PROVIDER_SITE_OTHER): Payer: Self-pay | Admitting: Physician Assistant

## 2018-02-06 VITALS — Ht 67.0 in | Wt 236.8 lb

## 2018-02-06 DIAGNOSIS — E11621 Type 2 diabetes mellitus with foot ulcer: Secondary | ICD-10-CM

## 2018-02-06 DIAGNOSIS — N183 Chronic kidney disease, stage 3 unspecified: Secondary | ICD-10-CM

## 2018-02-06 DIAGNOSIS — L97522 Non-pressure chronic ulcer of other part of left foot with fat layer exposed: Secondary | ICD-10-CM

## 2018-02-06 DIAGNOSIS — Z89511 Acquired absence of right leg below knee: Secondary | ICD-10-CM

## 2018-02-06 MED ORDER — DOXYCYCLINE HYCLATE 100 MG PO CAPS: 100.0000 mg | ORAL_CAPSULE | Freq: Two times a day (BID) | ORAL | 0 refills | Status: DC

## 2018-02-06 NOTE — Progress Notes (Signed)
Office Visit Note   Patient: [name]           Date of Birth: 11/03/1965           MRN: [id number] Visit Date: 02/06/2018              Requested by: [name], [name], PA-C No address on file PCP: [name], [name], PA-C  Chief Complaint  Patient presents with  . Left Foot - Follow-up, Pain, Wound Check    ER F/U 01/25/2018 NP      HPI: The patient is a 53 year old woman who presents with a left foot ulcer.  She has diabetes with peripheral neuropathy and a history of a previous right transtibial amputation in 2013.  She also has severe visual impairment due to cataracts. She was initially evaluated in the emergency room on 01/25/2018 and referred for follow-up here.  She was started on doxycycline.  She is also on gabapentin and metformin for her diabetes.  She was instructed to wash the foot after her emergency room visit but has not felt comfortable with this.  She does not have any assistance at home. She has a very large ulcer and callus about the left great toe with significant drainage and swelling of the foot.    Assessment & Plan: Visit Diagnoses:  1. Diabetic ulcer of toe of left foot associated with type 2 diabetes mellitus, with fat layer exposed (HCC)   2. Acquired absence of right lower extremity below knee (HCC)   3. CKD (chronic kidney disease) stage 3, GFR 30-59 ml/min (HCC)     Plan: She will continue on doxycycline 100 mg p.o. twice daily.  She was instructed to wash the foot with Dial soap and water and to dress it with dry gauze and Ace wrapping daily.  Elevate and offload the foot as much as possible.  She will be referred for MRI scan of the left foot is we are concerned for osteomyelitis and abscess.  She will follow-up in 2 weeks.  Follow-Up Instructions: Return in about 2 weeks (around 02/20/2018).   Ortho Exam  Patient is alert, oriented, no adenopathy, well-dressed, normal affect, normal respiratory effort. The patient is presenting in a  wheelchair.  She is status post a right below the knee amputation in the past.  She presents with severe visual impairment. Examination of the left foot shows callus over the plantar surface and lateral great toe with deep ulceration suspected.  She has edema throughout the left foot and erythema.    She has biphasic dorsalis pedis and posterior tibialis pulses by Doppler.  Imaging: No results found. No images are attached to the encounter.  Labs: Lab Results  Component Value Date   HGBA1C 7.5 (H) 01/25/2018   HGBA1C 5.4 03/03/2017   HGBA1C 6.5 (H) 11/25/2015   ESRSEDRATE 108 (H) 08/24/2015   ESRSEDRATE >140 (H) 11/28/2010   CRP 5.1 (H) 08/24/2015   CRP 4.27 (H) 11/28/2010   REPTSTATUS 01/30/2018 FINAL 01/25/2018   GRAMSTAIN  11/24/2010    FEW WBC PRESENT, PREDOMINANTLY PMN FEW GRAM POSITIVE COCCI IN PAIRS FEW GRAM POSITIVE RODS RARE GRAM NEGATIVE RODS   GRAMSTAIN  11/24/2010    FEW WBC PRESENT, PREDOMINANTLY PMN FEW GRAM POSITIVE COCCI IN PAIRS FEW GRAM POSITIVE RODS   GRAMSTAIN  11/24/2010    RARE WBC PRESENT, PREDOMINANTLY PMN NO ORGANISMS SEEN   GRAMSTAIN  11/24/2010    RARE WBC PRESENT, PREDOMINANTLY PMN NO ORGANISMS SEEN   CULT  01/25/2018    NO GROWTH 5 DAYS Performed at [facility], [address], [place], [place] [address]    Imelda Pillow ENTEROCOCCUS FAECALIS (A) 11/25/2015     Lab Results  Component Value Date   ALBUMIN 3.2 (L) 03/07/2017   ALBUMIN 3.9 03/02/2017   ALBUMIN 4.2 11/25/2015   PREALBUMIN 13.2 (L) 03/04/2017    Body mass index is 37.08 kg/m.  Orders:  Orders Placed This Encounter  Procedures  . MR Foot Left w/o contrast   Meds ordered this encounter  Medications  . doxycycline (VIBRAMYCIN) 100 MG capsule    Sig: Take 1 capsule (100 mg total) by mouth 2 (two) times daily.    Dispense:  28 capsule    Refill:  0     Procedures: No procedures performed  Clinical Data: No additional findings.  ROS:  All other systems  negative, except as noted in the HPI. Review of Systems  Objective: Vital Signs: Ht 5\' 7"  (1.702 m)   Wt 236 lb 12.3 oz (107.4 kg)   BMI 37.08 kg/m   Specialty Comments:  No specialty comments available.  PMFS History: Patient Active Problem List   Diagnosis Date Noted  . CKD (chronic kidney disease) stage 3, GFR 30-59 ml/min (HCC)   . Cellulitis of left lower extremity 01/25/2018  . Hyperkalemia 01/25/2018  . Poor compliance 03/08/2017  . Hypoxia 03/07/2017  . Thyroid nodule 03/07/2017  . Chronic indwelling Foley catheter 03/04/2017  . Type II diabetes mellitus (HCC)   . Femur fracture (HCC) 03/02/2017  . Orthostatic dizziness   . Urinary tract infection without hematuria   . Enterococcus faecalis infection   . Near syncope 11/25/2015  . Dehydration 11/25/2015  . Orthostatic hypotension 11/25/2015  . UTI (urinary tract infection) 11/25/2015  . Acute worsening of stage 3 chronic kidney disease (HCC) 09/24/2015  . Diabetes mellitus due to underlying condition with chronic kidney disease, without long-term current use of insulin (HCC)   . Fever   . Status post below knee amputation of right lower extremity (HCC)   . Diabetic peripheral neuropathy (HCC)   . Neuropathic pain   . Benign essential HTN   . Folliculitis   . Slow transit constipation   . Anemia of chronic disease   . Bilateral hydronephrosis   . Urinary retention   . CKD (chronic kidney disease)   . Uncontrolled type 2 diabetes mellitus with diabetic nephropathy, without long-term current use of insulin (HCC)   . Vasculopathy   . Debility 09/04/2015  . DM type 2 with diabetic peripheral neuropathy (HCC)   . S/P BKA (below knee amputation) unilateral (HCC)   . Medical non-compliance   . Benign skin lesion of multiple sites   . Tachypnea   . Acute blood loss anemia   . Neurogenic bladder   . Occult blood positive stool   . Rectovaginal fistula   . Controlled diabetes mellitus type 2 with complications  (HCC)   . Acute on chronic renal failure (HCC)   . Hydronephrosis determined by ultrasound   . Papular rash   . Uncontrolled type 2 diabetes mellitus with complication (HCC)   . Paresthesia   . Thrombocytopenia (HCC) 08/23/2015  . Pressure ulcer 08/23/2015  . Severe anemia 08/23/2015  . Lower urinary tract infectious disease 08/23/2015  . Absolute anemia 08/23/2015  . Peripheral vascular disease, unspecified (HCC) 04/27/2012  . Unilateral complete BKA (HCC) 07/09/2011  . Gas gangrene (HCC) 07/02/2011  . Sepsis(995.91) 07/02/2011  . Acute renal  failure (HCC) 07/02/2011  . Diabetes mellitus (HCC) 11/22/2010  . Diabetic foot ulcer associated with type 2 diabetes mellitus (HCC)   . Hypertension   . Arthritis   . Neuropathy (HCC)   . Intracerebral hemorrhage [facility](HCC)    Past Medical History:  Diagnosis Date  . Anemia   . Arthritis    "spine, hands" (11/25/2015)  . Back pain    "all my back; probably 3 times/week" (11/25/2015)  . Chronic indwelling Foley catheter 03/04/2017  . Diabetic foot ulcer associated with type 2 diabetes mellitus (HCC)   . Hypertension   . Intracerebral hemorrhage (HCC) As a teenager    States she had burst blood vessel as teenager, now with resultant minor visual field and hearing deficits  . Neuropathy   . Stroke [facility](HCC) ~ 1982   "my feeling on my RLE, right lower eye vision,  & hearing out of my right ear not the same since" (11/25/2015)  . Type II diabetes mellitus (HCC)     Family History  Problem Relation Age of Onset  . Diabetes Mother   . Hypertension Mother   . Hyperlipidemia Mother   . Diabetes Father   . Cancer Father        prostate  . Hyperlipidemia Father   . Hypertension Father   . Diabetes Brother   . Peripheral Artery Disease Brother        has had toes amputated    Past Surgical History:  Procedure Laterality Date  . AMPUTATION  11/24/2010   Procedure: AMPUTATION FOOT;  Surgeon: Toni ArthursJohn Hewitt, MD;  [facility];  Service:  Orthopedics;  Laterality: Right;  Right  FOOT TRANS METATARSAL AMPUTATION  . AMPUTATION  07/02/2011   Procedure: AMPUTATION BELOW KNEE;  Surgeon: Toni ArthursJohn Hewitt, MD;  [facility]: [facility] OR;  Service: Orthopedics;  Laterality: Right;  . APPLICATION OF WOUND VAC  07/02/2011   Procedure: APPLICATION OF WOUND VAC;  Surgeon: Toni ArthursJohn Hewitt, MD;  Location: [facility] OR;  Service: Orthopedics;  Laterality: Right;  . BRAIN SURGERY  1980's   [place]  . CESAREAN SECTION  2004; 2006  . COLONOSCOPY N/A 09/17/2015   Procedure: COLONOSCOPY;  Surgeon: Charlott RakesVincent Schooler, MD;  [facility];  Service: Endoscopy;  Laterality: N/A;  . ESOPHAGOGASTRODUODENOSCOPY N/A 09/17/2015   Procedure: ESOPHAGOGASTRODUODENOSCOPY (EGD);  Surgeon: Charlott RakesVincent Schooler, MD;  [facility];  Service: Endoscopy;  Laterality: N/A;  . I&D EXTREMITY  11/24/2010   Procedure: IRRIGATION AND DEBRIDEMENT EXTREMITY;  Surgeon: Toni ArthursJohn Hewitt, MD;  Location: [facility] OR;  Service: Orthopedics;  Laterality: Left;  Debriedment of left plantar ulcer and trimming of toenails  . I&D EXTREMITY  07/02/2011   Procedure: IRRIGATION AND DEBRIDEMENT EXTREMITY;  Surgeon: Toni ArthursJohn Hewitt, MD;  Location: [facility] OR;  Service: Orthopedics;  Laterality: Left;  I & D of Left Foot  . I&D EXTREMITY  07/06/2011   Procedure: IRRIGATION AND DEBRIDEMENT EXTREMITY;  Surgeon: Toni ArthursJohn Hewitt, MD;  Location: [facility] OR;  Service: Orthopedics;  Laterality: Right;  IRRIGATION/DEBRIDEMENT RIGHT BELOW KNEE AMPUTATION  . TUBAL LIGATION  2006   Social History   Occupational History  . Not on file  Tobacco Use  . Smoking status: Never Smoker  . Smokeless tobacco: Never Used  Substance and Sexual Activity  . Alcohol use: Yes    Alcohol/week: 2.0 standard drinks    Types: 2 Glasses of wine per week  . Drug use: No  . Sexual activity: Never    Birth control/protection: Post-menopausal

## 2018-02-08 ENCOUNTER — Encounter (INDEPENDENT_AMBULATORY_CARE_PROVIDER_SITE_OTHER): Payer: Self-pay | Admitting: Physician Assistant

## 2018-02-21 ENCOUNTER — Ambulatory Visit (INDEPENDENT_AMBULATORY_CARE_PROVIDER_SITE_OTHER): Payer: Self-pay | Admitting: Physician Assistant

## 2018-02-21 ENCOUNTER — Encounter (INDEPENDENT_AMBULATORY_CARE_PROVIDER_SITE_OTHER): Payer: Self-pay | Admitting: Orthopedic Surgery

## 2018-02-21 VITALS — Ht 67.0 in | Wt 236.8 lb

## 2018-02-21 DIAGNOSIS — Z89511 Acquired absence of right leg below knee: Secondary | ICD-10-CM

## 2018-02-21 DIAGNOSIS — N183 Chronic kidney disease, stage 3 unspecified: Secondary | ICD-10-CM

## 2018-02-21 DIAGNOSIS — Z792 Long term (current) use of antibiotics: Secondary | ICD-10-CM

## 2018-02-21 DIAGNOSIS — E11621 Type 2 diabetes mellitus with foot ulcer: Secondary | ICD-10-CM

## 2018-02-21 DIAGNOSIS — L97522 Non-pressure chronic ulcer of other part of left foot with fat layer exposed: Secondary | ICD-10-CM

## 2018-02-21 NOTE — Progress Notes (Signed)
Office Visit Note   Patient: [name]           Date of Birth: 09/13/1965           MRN: [id number] Visit Date: 02/21/2018              Requested by: Loletta Specter, PA-C No address on file PCP: Loletta Specter, PA-C  Chief Complaint  Patient presents with  . Left Foot - Follow-up      HPI: The patient is a 53 year old woman who is seen for follow-up of her left foot plantar ulcer.  She has severe diabetic insensate neuropathy.  She has a previous right transtibial amputation in 2013.  She also has severe visual impairment due to severe cataracts and has not been able to have surgery on this as yet.  She reports that at home she has been applying some Betadine solution to her left plantar foot ulcer and trying to stay off of the foot as much as possible.  She does have an MRI scan of the foot scheduled for 02/27/2018.  She has continued on doxycycline 100 mg p.o. twice daily.  She reports minimal pain.  Assessment & Plan: Visit Diagnoses:  1. Diabetic ulcer of toe of left foot associated with type 2 diabetes mellitus, with fat layer exposed (HCC)   2. Acquired absence of right lower extremity below knee (HCC)   3. CKD (chronic kidney disease) stage 3, GFR 30-59 ml/min (HCC)     Plan: She will continue doxycycline 100 mg p.o. twice daily.  She should continue to wash the foot with Dial soap and water dry gauze and Ace wrapping daily.  Continue to avoid weightbearing and elevate as much as possible.  She will follow-up next week following her MRI scan of her foot to rule out osteomyelitis.  Follow-Up Instructions: Return in about 1 week (around 02/28/2018).   Ortho Exam  Patient is alert, oriented, no adenopathy, well-dressed, normal affect, normal respiratory effort. The patient's left foot plantar ulcer and blistering over the lateral great toe have improved.  The blister has essentially resolved and there is dry skin that is removed.  The residual ulcer is  approximately 2 cm without visible bone or tendon.  It is much drier and there is less odor.  She has biphasic pulses by Doppler.  Her edema is improved as is her erythema.    Imaging: No results found. No images are attached to the encounter.  Labs: Lab Results  Component Value Date   HGBA1C 7.5 (H) 01/25/2018   HGBA1C 5.4 03/03/2017   HGBA1C 6.5 (H) 11/25/2015   ESRSEDRATE 108 (H) 08/24/2015   ESRSEDRATE >140 (H) 11/28/2010   CRP 5.1 (H) 08/24/2015   CRP 4.27 (H) 11/28/2010   REPTSTATUS 01/30/2018 FINAL 01/25/2018   GRAMSTAIN  11/24/2010    FEW WBC PRESENT, PREDOMINANTLY PMN FEW GRAM POSITIVE COCCI IN PAIRS FEW GRAM POSITIVE RODS RARE GRAM NEGATIVE RODS   GRAMSTAIN  11/24/2010    FEW WBC PRESENT, PREDOMINANTLY PMN FEW GRAM POSITIVE COCCI IN PAIRS FEW GRAM POSITIVE RODS   GRAMSTAIN  11/24/2010    RARE WBC PRESENT, PREDOMINANTLY PMN NO ORGANISMS SEEN   GRAMSTAIN  11/24/2010    RARE WBC PRESENT, PREDOMINANTLY PMN NO ORGANISMS SEEN   CULT  01/25/2018    NO GROWTH 5 DAYS Performed at [facility], [address], [place], [place] [address]    Imelda Pillow ENTEROCOCCUS FAECALIS (A) 11/25/2015     Lab  Results  Component Value Date   ALBUMIN 3.2 (L) 03/07/2017   ALBUMIN 3.9 03/02/2017   ALBUMIN 4.2 11/25/2015   PREALBUMIN 13.2 (L) 03/04/2017    Body mass index is 37.08 kg/m.  Orders:  No orders of the defined types were placed in this encounter.  No orders of the defined types were placed in this encounter.    Procedures: No procedures performed  Clinical Data: No additional findings.  ROS:  All other systems negative, except as noted in the HPI. Review of Systems  Objective: Vital Signs: Ht 5\' 7"  (1.702 m)   Wt 236 lb 12.3 oz (107.4 kg)   BMI 37.08 kg/m   Specialty Comments:  No specialty comments available.  PMFS History: Patient Active Problem List   Diagnosis Date Noted  . CKD (chronic kidney disease) stage 3, GFR 30-59 ml/min (HCC)     . Cellulitis of left lower extremity 01/25/2018  . Hyperkalemia 01/25/2018  . Poor compliance 03/08/2017  . Hypoxia 03/07/2017  . Thyroid nodule 03/07/2017  . Chronic indwelling Foley catheter 03/04/2017  . Type II diabetes mellitus (HCC)   . Femur fracture (HCC) 03/02/2017  . Orthostatic dizziness   . Urinary tract infection without hematuria   . Enterococcus faecalis infection   . Near syncope 11/25/2015  . Dehydration 11/25/2015  . Orthostatic hypotension 11/25/2015  . UTI (urinary tract infection) 11/25/2015  . Acute worsening of stage 3 chronic kidney disease (HCC) 09/24/2015  . Diabetes mellitus due to underlying condition with chronic kidney disease, without long-term current use of insulin (HCC)   . Fever   . Status post below knee amputation of right lower extremity (HCC)   . Diabetic peripheral neuropathy (HCC)   . Neuropathic pain   . Benign essential HTN   . Folliculitis   . Slow transit constipation   . Anemia of chronic disease   . Bilateral hydronephrosis   . Urinary retention   . CKD (chronic kidney disease)   . Uncontrolled type 2 diabetes mellitus with diabetic nephropathy, without long-term current use of insulin (HCC)   . Vasculopathy   . Debility 09/04/2015  . DM type 2 with diabetic peripheral neuropathy (HCC)   . S/P BKA (below knee amputation) unilateral (HCC)   . Medical non-compliance   . Benign skin lesion of multiple sites   . Tachypnea   . Acute blood loss anemia   . Neurogenic bladder   . Occult blood positive stool   . Rectovaginal fistula   . Controlled diabetes mellitus type 2 with complications (HCC)   . Acute on chronic renal failure (HCC)   . Hydronephrosis determined by ultrasound   . Papular rash   . Uncontrolled type 2 diabetes mellitus with complication (HCC)   . Paresthesia   . Thrombocytopenia (HCC) 08/23/2015  . Pressure ulcer 08/23/2015  . Severe anemia 08/23/2015  . Lower urinary tract infectious disease 08/23/2015  .  Absolute anemia 08/23/2015  . Peripheral vascular disease, unspecified (HCC) 04/27/2012  . Unilateral complete BKA (HCC) 07/09/2011  . Gas gangrene (HCC) 07/02/2011  . Sepsis(995.91) 07/02/2011  . Acute renal failure (HCC) 07/02/2011  . Diabetes mellitus (HCC) 11/22/2010  . Diabetic foot ulcer associated with type 2 diabetes mellitus (HCC)   . Hypertension   . Arthritis   . Neuropathy (HCC)   . Intracerebral hemorrhage [facility])    Past Medical History:  Diagnosis Date  . Anemia   . Arthritis    "spine, hands" (11/25/2015)  . Back pain    "  all my back; probably 3 times/week" (11/25/2015)  . Chronic indwelling Foley catheter 03/04/2017  . Diabetic foot ulcer associated with type 2 diabetes mellitus (HCC)   . Hypertension   . Intracerebral hemorrhage (HCC) As a teenager    States she had burst blood vessel as teenager, now with resultant minor visual field and hearing deficits  . Neuropathy   . Stroke [facility](HCC) ~ 1982   "my feeling on my RLE, right lower eye vision,  & hearing out of my right ear not the same since" (11/25/2015)  . Type II diabetes mellitus (HCC)     Family History  Problem Relation Age of Onset  . Diabetes Mother   . Hypertension Mother   . Hyperlipidemia Mother   . Diabetes Father   . Cancer Father        prostate  . Hyperlipidemia Father   . Hypertension Father   . Diabetes Brother   . Peripheral Artery Disease Brother        has had toes amputated    Past Surgical History:  Procedure Laterality Date  . AMPUTATION  11/24/2010   Procedure: AMPUTATION FOOT;  Surgeon: Toni ArthursJohn Hewitt, MD;  [facility];  Service: Orthopedics;  Laterality: Right;  Right  FOOT TRANS METATARSAL AMPUTATION  . AMPUTATION  07/02/2011   Procedure: AMPUTATION BELOW KNEE;  Surgeon: Toni ArthursJohn Hewitt, MD;  [facility]: [facility] OR;  Service: Orthopedics;  Laterality: Right;  . APPLICATION OF WOUND VAC  07/02/2011   Procedure: APPLICATION OF WOUND VAC;  Surgeon: Toni ArthursJohn Hewitt, MD;  [facility]: [facility] OR;  Service:  Orthopedics;  Laterality: Right;  . BRAIN SURGERY  1980's   [place]  . CESAREAN SECTION  2004; 2006  . COLONOSCOPY N/A 09/17/2015   Procedure: COLONOSCOPY;  Surgeon: Charlott RakesVincent Schooler, MD;  [facility];  Service: Endoscopy;  Laterality: N/A;  . ESOPHAGOGASTRODUODENOSCOPY N/A 09/17/2015   Procedure: ESOPHAGOGASTRODUODENOSCOPY (EGD);  Surgeon: Charlott RakesVincent Schooler, MD;  [facility];  Service: Endoscopy;  Laterality: N/A;  . I&D EXTREMITY  11/24/2010   Procedure: IRRIGATION AND DEBRIDEMENT EXTREMITY;  Surgeon: Toni ArthursJohn Hewitt, MD;  [facility]: [facility] OR;  Service: Orthopedics;  Laterality: Left;  Debriedment of left plantar ulcer and trimming of toenails  . I&D EXTREMITY  07/02/2011   Procedure: IRRIGATION AND DEBRIDEMENT EXTREMITY;  Surgeon: Toni ArthursJohn Hewitt, MD;  [facility]: [facility] OR;  Service: Orthopedics;  Laterality: Left;  I & D of Left Foot  . I&D EXTREMITY  07/06/2011   Procedure: IRRIGATION AND DEBRIDEMENT EXTREMITY;  Surgeon: Toni ArthursJohn Hewitt, MD;  [facility]: [facility] OR;  Service: Orthopedics;  Laterality: Right;  IRRIGATION/DEBRIDEMENT RIGHT BELOW KNEE AMPUTATION  . TUBAL LIGATION  2006   Social History   Occupational History  . Not on file  Tobacco Use  . Smoking status: Never Smoker  . Smokeless tobacco: Never Used  Substance and Sexual Activity  . Alcohol use: Yes    Alcohol/week: 2.0 standard drinks    Types: 2 Glasses of wine per week  . Drug use: No  . Sexual activity: Never    Birth control/protection: Post-menopausal

## 2018-02-24 ENCOUNTER — Encounter (INDEPENDENT_AMBULATORY_CARE_PROVIDER_SITE_OTHER): Payer: Self-pay | Admitting: Physician Assistant

## 2018-02-27 ENCOUNTER — Ambulatory Visit
Admission: RE | Admit: 2018-02-27 | Discharge: 2018-02-27 | Disposition: A | Discharge status: Home / Self Care | Payer: Medicaid Other | Source: Ambulatory Visit | Attending: Physician Assistant | Admitting: Physician Assistant

## 2018-02-27 ENCOUNTER — Inpatient Hospital Stay: Admission: RE | Admit: 2018-02-27 | Payer: Medicaid Other | Source: Ambulatory Visit

## 2018-02-27 DIAGNOSIS — L97522 Non-pressure chronic ulcer of other part of left foot with fat layer exposed: Principal | ICD-10-CM

## 2018-02-27 DIAGNOSIS — E11621 Type 2 diabetes mellitus with foot ulcer: Secondary | ICD-10-CM

## 2018-02-28 ENCOUNTER — Ambulatory Visit (INDEPENDENT_AMBULATORY_CARE_PROVIDER_SITE_OTHER): Payer: Self-pay | Admitting: Orthopedic Surgery

## 2018-03-06 ENCOUNTER — Encounter (INDEPENDENT_AMBULATORY_CARE_PROVIDER_SITE_OTHER): Payer: Self-pay | Admitting: Orthopedic Surgery

## 2018-03-06 ENCOUNTER — Ambulatory Visit (INDEPENDENT_AMBULATORY_CARE_PROVIDER_SITE_OTHER): Payer: Self-pay | Admitting: Orthopedic Surgery

## 2018-03-06 VITALS — Ht 67.0 in | Wt 236.8 lb

## 2018-03-06 DIAGNOSIS — Z89511 Acquired absence of right leg below knee: Secondary | ICD-10-CM

## 2018-03-06 DIAGNOSIS — E11621 Type 2 diabetes mellitus with foot ulcer: Secondary | ICD-10-CM

## 2018-03-06 DIAGNOSIS — L97522 Non-pressure chronic ulcer of other part of left foot with fat layer exposed: Secondary | ICD-10-CM

## 2018-03-06 DIAGNOSIS — B351 Tinea unguium: Secondary | ICD-10-CM

## 2018-03-06 DIAGNOSIS — E441 Mild protein-calorie malnutrition: Secondary | ICD-10-CM

## 2018-03-06 NOTE — Progress Notes (Addendum)
Office Visit Note   Patient: [name]           Date of Birth: [birth year]           MRN: [id number] Visit Date: 03/06/2018              Requested by: Loletta Specter, PA-C No address on file PCP: Loletta Specter, PA-C  Chief Complaint  Patient presents with  . Left Foot - Follow-up    MRI Review left foot      HPI: Patient is seen in follow-up for her left lower extremity.  She had an MRI scan to rule out osteomyelitis beneath the first metatarsal head.  Patient states she has minor neuropathic pain in her foot that she takes Neurontin for.  She states she does not take any narcotic pain medicine.  She states she is currently using iodine for treating the wound.  Assessment & Plan: Visit Diagnoses:  1. Diabetic ulcer of toe of left foot associated with type 2 diabetes mellitus, with fat layer exposed (HCC)   2. Acquired absence of right lower extremity below knee (HCC)   3. Mild protein-calorie malnutrition (HCC)   4. Onychomycosis     Plan: Discussed the importance of stopping the iodine treatment recommended antibiotic ointment and a Band-Aid.  A felt relieving donut is applied to her postoperative shoe this should further unload the first metatarsal head.  Nails were trimmed and the x5.  Follow-Up Instructions: Return in about 4 weeks (around 04/03/2018).   Ortho Exam  Patient is alert, oriented, no adenopathy, well-dressed, normal affect, normal respiratory effort. Examination of the left lower extremity patient has good granulation tissue beneath the first metatarsal head.  The skin is dry discolored and peeling most likely due to the iodine.  She has a good dorsalis pedis pulse.  The wound bed has good granulation tissue this is 5 mm in diameter 0.1 mm deep this does not probe to tendon or bone.  She has thickened discolored onychomycotic nails she has cataracts she cannot see her feet or safely trim the nails due to her diabetic insensate neuropathy and the  nails were trimmed x5 without complications.  Patient has a hemoglobin A1c of 7.5 and a prealbumin of 13 with protein caloric malnutrition.  Review of the MRI scan shows no evidence of osteomyelitis no evidence of abscess.  Imaging: No results found. No images are attached to the encounter.  Labs: Lab Results  Component Value Date   HGBA1C 7.5 (H) 01/25/2018   HGBA1C 5.4 03/03/2017   HGBA1C 6.5 (H) 11/25/2015   ESRSEDRATE 108 (H) 08/24/2015   ESRSEDRATE >140 (H) 11/28/2010   CRP 5.1 (H) 08/24/2015   CRP 4.27 (H) 11/28/2010   REPTSTATUS 01/30/2018 FINAL 01/25/2018   GRAMSTAIN  11/24/2010    FEW WBC PRESENT, PREDOMINANTLY PMN FEW GRAM POSITIVE COCCI IN PAIRS FEW GRAM POSITIVE RODS RARE GRAM NEGATIVE RODS   GRAMSTAIN  11/24/2010    FEW WBC PRESENT, PREDOMINANTLY PMN FEW GRAM POSITIVE COCCI IN PAIRS FEW GRAM POSITIVE RODS   GRAMSTAIN  11/24/2010    RARE WBC PRESENT, PREDOMINANTLY PMN NO ORGANISMS SEEN   GRAMSTAIN  11/24/2010    RARE WBC PRESENT, PREDOMINANTLY PMN NO ORGANISMS SEEN   CULT  01/25/2018    NO GROWTH 5 DAYS Performed at [facility], [address] [place], [place] [address]    Imelda Pillow ENTEROCOCCUS FAECALIS (A) 11/25/2015     Lab Results  Component Value Date  ALBUMIN 3.2 (L) 03/07/2017   ALBUMIN 3.9 03/02/2017   ALBUMIN 4.2 11/25/2015   PREALBUMIN 13.2 (L) 03/04/2017    Body mass index is 37.08 kg/m.  Orders:  No orders of the defined types were placed in this encounter.  No orders of the defined types were placed in this encounter.    Procedures: No procedures performed  Clinical Data: No additional findings.  ROS:  All other systems negative, except as noted in the HPI. Review of Systems  Objective: Vital Signs: Ht 5\' 7"  (1.702 m)   Wt 236 lb 12.3 oz (107.4 kg)   BMI 37.08 kg/m   Specialty Comments:  No specialty comments available.  PMFS History: Patient Active Problem List   Diagnosis Date Noted  . CKD (chronic  kidney disease) stage 3, GFR 30-59 ml/min (HCC)   . Cellulitis of left lower extremity 01/25/2018  . Hyperkalemia 01/25/2018  . Poor compliance 03/08/2017  . Hypoxia 03/07/2017  . Thyroid nodule 03/07/2017  . Chronic indwelling Foley catheter 03/04/2017  . Type II diabetes mellitus (HCC)   . Femur fracture (HCC) 03/02/2017  . Orthostatic dizziness   . Urinary tract infection without hematuria   . Enterococcus faecalis infection   . Near syncope 11/25/2015  . Dehydration 11/25/2015  . Orthostatic hypotension 11/25/2015  . UTI (urinary tract infection) 11/25/2015  . Acute worsening of stage 3 chronic kidney disease (HCC) 09/24/2015  . Diabetes mellitus due to underlying condition with chronic kidney disease, without long-term current use of insulin (HCC)   . Fever   . Status post below knee amputation of right lower extremity (HCC)   . Diabetic peripheral neuropathy (HCC)   . Neuropathic pain   . Benign essential HTN   . Folliculitis   . Slow transit constipation   . Anemia of chronic disease   . Bilateral hydronephrosis   . Urinary retention   . CKD (chronic kidney disease)   . Uncontrolled type 2 diabetes mellitus with diabetic nephropathy, without long-term current use of insulin (HCC)   . Vasculopathy   . Debility 09/04/2015  . DM type 2 with diabetic peripheral neuropathy (HCC)   . S/P BKA (below knee amputation) unilateral (HCC)   . Medical non-compliance   . Benign skin lesion of multiple sites   . Tachypnea   . Acute blood loss anemia   . Neurogenic bladder   . Occult blood positive stool   . Rectovaginal fistula   . Controlled diabetes mellitus type 2 with complications (HCC)   . Acute on chronic renal failure (HCC)   . Hydronephrosis determined by ultrasound   . Papular rash   . Uncontrolled type 2 diabetes mellitus with complication (HCC)   . Paresthesia   . Thrombocytopenia (HCC) 08/23/2015  . Pressure ulcer 08/23/2015  . Severe anemia 08/23/2015  . Lower  urinary tract infectious disease 08/23/2015  . Absolute anemia 08/23/2015  . Peripheral vascular disease, unspecified (HCC) 04/27/2012  . Unilateral complete BKA (HCC) 07/09/2011  . Gas gangrene (HCC) 07/02/2011  . Sepsis(995.91) 07/02/2011  . Acute renal failure (HCC) 07/02/2011  . Diabetes mellitus (HCC) 11/22/2010  . Diabetic foot ulcer associated with type 2 diabetes mellitus (HCC)   . Hypertension   . Arthritis   . Neuropathy (HCC)   . Intracerebral hemorrhage [facility])    Past Medical History:  Diagnosis Date  . Anemia   . Arthritis    "spine, hands" (11/25/2015)  . Back pain    "all my back; probably 3 times/week" (11/25/2015)  .  Chronic indwelling Foley catheter 03/04/2017  . Diabetic foot ulcer associated with type 2 diabetes mellitus (HCC)   . Hypertension   . Intracerebral hemorrhage (HCC) As a teenager    States she had burst blood vessel as teenager, now with resultant minor visual field and hearing deficits  . Neuropathy   . Stroke [facility]) ~ 1982   "my feeling on my RLE, right lower eye vision,  & hearing out of my right ear not the same since" (11/25/2015)  . Type II diabetes mellitus (HCC)     Family History  Problem Relation Age of Onset  . Diabetes Mother   . Hypertension Mother   . Hyperlipidemia Mother   . Diabetes Father   . Cancer Father        prostate  . Hyperlipidemia Father   . Hypertension Father   . Diabetes Brother   . Peripheral Artery Disease Brother        has had toes amputated    Past Surgical History:  Procedure Laterality Date  . AMPUTATION  11/24/2010   Procedure: AMPUTATION FOOT;  Surgeon: [name], MD;  Location[facility] OR;  Service: Orthopedics;  Laterality: Right;  Right  FOOT TRANS METATARSAL AMPUTATION  . AMPUTATION  07/02/2011   Procedure: AMPUTATION BELOW KNEE;  Surgeon: [name], MD;  Location: [facility] OR;  Service: Orthopedics;  Laterality: Right;  . APPLICATION OF WOUND VAC  07/02/2011   Procedure: APPLICATION OF WOUND VAC;   Surgeon: [name], MD;  Location: [facility] OR;  Service: Orthopedics;  Laterality: Right;  . BRAIN SURGERY  1980's   Previous brain surgery in [place], [place]  . CESAREAN SECTION  2004; 2006  . COLONOSCOPY N/A 09/17/2015   Procedure: COLONOSCOPY;  Surgeon: Charlott Rakes, MD;  Location: [facility] ENDOSCOPY;  Service: Endoscopy;  Laterality: N/A;  . ESOPHAGOGASTRODUODENOSCOPY N/A 09/17/2015   Procedure: ESOPHAGOGASTRODUODENOSCOPY (EGD);  Surgeon: Charlott Rakes, MD;  Location: [facility] ENDOSCOPY;  Service: Endoscopy;  Laterality: N/A;  . I&D EXTREMITY  11/24/2010   Procedure: IRRIGATION AND DEBRIDEMENT EXTREMITY;  Surgeon: [name], MD;  Location: [facility] OR;  Service: Orthopedics;  Laterality: Left;  Debriedment of left plantar ulcer and trimming of toenails  . I&D EXTREMITY  07/02/2011   Procedure: IRRIGATION AND DEBRIDEMENT EXTREMITY;  Surgeon: [name], MD;  Location: [facility] OR;  Service: Orthopedics;  Laterality: Left;  I & D of Left Foot  . I&D EXTREMITY  07/06/2011   Procedure: IRRIGATION AND DEBRIDEMENT EXTREMITY;  Surgeon: [name], MD;  Location: [facility] OR;  Service: Orthopedics;  Laterality: Right;  IRRIGATION/DEBRIDEMENT RIGHT BELOW KNEE AMPUTATION  . TUBAL LIGATION  2006   Social History   Occupational History  . Not on file  Tobacco Use  . Smoking status: Never Smoker  . Smokeless tobacco: Never Used  Substance and Sexual Activity  . Alcohol use: Yes    Alcohol/week: 2.0 standard drinks    Types: 2 Glasses of wine per week  . Drug use: No  . Sexual activity: Never    Birth control/protection: Post-menopausal

## 2018-03-29 ENCOUNTER — Telehealth (INDEPENDENT_AMBULATORY_CARE_PROVIDER_SITE_OTHER): Payer: Self-pay

## 2018-03-29 NOTE — Telephone Encounter (Signed)
I tried to call pt again, lvm to return call and also called her mom to inform her to try and call the office to get pt rescheduled to earlier time on 3/31. Mother stated that she will try to get in touch with her.

## 2018-04-04 ENCOUNTER — Ambulatory Visit (INDEPENDENT_AMBULATORY_CARE_PROVIDER_SITE_OTHER): Payer: Self-pay | Admitting: Orthopedic Surgery

## 2018-09-13 ENCOUNTER — Emergency Department (HOSPITAL_COMMUNITY)
Admission: EM | Admit: 2018-09-13 | Discharge: 2018-09-13 | Disposition: A | Discharge status: Home / Self Care | Payer: Medicaid Other | Attending: Emergency Medicine | Admitting: Emergency Medicine

## 2018-09-13 ENCOUNTER — Other Ambulatory Visit: Payer: Self-pay

## 2018-09-13 ENCOUNTER — Encounter (HOSPITAL_COMMUNITY): Payer: Self-pay

## 2018-09-13 DIAGNOSIS — E1122 Type 2 diabetes mellitus with diabetic chronic kidney disease: Secondary | ICD-10-CM | POA: Insufficient documentation

## 2018-09-13 DIAGNOSIS — I129 Hypertensive chronic kidney disease with stage 1 through stage 4 chronic kidney disease, or unspecified chronic kidney disease: Secondary | ICD-10-CM | POA: Insufficient documentation

## 2018-09-13 DIAGNOSIS — N183 Chronic kidney disease, stage 3 (moderate): Secondary | ICD-10-CM | POA: Insufficient documentation

## 2018-09-13 DIAGNOSIS — Z79899 Other long term (current) drug therapy: Secondary | ICD-10-CM | POA: Insufficient documentation

## 2018-09-13 DIAGNOSIS — R040 Epistaxis: Secondary | ICD-10-CM | POA: Insufficient documentation

## 2018-09-13 DIAGNOSIS — Z7984 Long term (current) use of oral hypoglycemic drugs: Secondary | ICD-10-CM | POA: Insufficient documentation

## 2018-09-13 MED ORDER — OXYMETAZOLINE HCL 0.05 % NA SOLN
1.0000 | Freq: Once | NASAL | Status: AC
  Administered 2018-09-13: 16:00:00 1 via NASAL
  Filled 2018-09-13: qty 30

## 2018-09-13 NOTE — ED Provider Notes (Signed)
[facility] EMERGENCY DEPARTMENT Provider Note   CSN: [id number] Arrival date & time: 09/13/18  1511     History   Chief Complaint Chief Complaint  Patient presents with  . Epistaxis    HPI [name] is a 53 y.o. female.     HPI   53 year old female presents today with plaints of epistaxis.  Patient notes in the early morning hours she developed bleeding out of her right nose.  She notes this is coming out of the front.  She does note some minor bleeding of the right side.  She notes that by the time she arrived the bleeding had slowed, they gave her Afrin which completely resolved the symptoms.  She has been waiting approximate 1 hour since that and has had no recurrent bleeds.  She denies any trauma to her nose.  She has no history of bleeding disorders.  She is not on blood thinners but takes 81 mg of aspirin.  No recent upper respiratory infections.  Past Medical History:  Diagnosis Date  . Anemia   . Arthritis    "spine, hands" (11/25/2015)  . Back pain    "all my back; probably 3 times/week" (11/25/2015)  . Chronic indwelling Foley catheter 03/04/2017  . Diabetic foot ulcer associated with type 2 diabetes mellitus ([place])   . Hypertension   . Intracerebral hemorrhage ([place]) As a teenager    States she had burst blood vessel as teenager, now with resultant minor visual field and hearing deficits  . Neuropathy   . Stroke [facility]) ~ 1982   "my feeling on my RLE, right lower eye vision,  & hearing out of my right ear not the same since" (11/25/2015)  . Type II diabetes mellitus [facility])     Patient Active Problem List   Diagnosis Date Noted  . CKD (chronic kidney disease) stage 3, GFR 30-59 ml/min (HCC)   . Cellulitis of left lower extremity 01/25/2018  . Hyperkalemia 01/25/2018  . Poor compliance 03/08/2017  . Hypoxia 03/07/2017  . Thyroid nodule 03/07/2017  . Chronic indwelling Foley catheter 03/04/2017  . Type II diabetes mellitus ([place])   . Femur  fracture ([place]) 03/02/2017  . Orthostatic dizziness   . Urinary tract infection without hematuria   . Enterococcus faecalis infection   . Near syncope 11/25/2015  . Dehydration 11/25/2015  . Orthostatic hypotension 11/25/2015  . UTI (urinary tract infection) 11/25/2015  . Acute worsening of stage 3 chronic kidney disease ([place]) 09/24/2015  . Diabetes mellitus due to underlying condition with chronic kidney disease, without long-term current use of insulin ([place])   . Fever   . Status post below knee amputation of right lower extremity ([place])   . Diabetic peripheral neuropathy ([place])   . Neuropathic pain   . Benign essential HTN   . Folliculitis   . Slow transit constipation   . Anemia of chronic disease   . Bilateral hydronephrosis   . Urinary retention   . CKD (chronic kidney disease)   . Uncontrolled type 2 diabetes mellitus with diabetic nephropathy, without long-term current use of insulin ([place])   . Vasculopathy   . Debility 09/04/2015  . DM type 2 with diabetic peripheral neuropathy ([place])   . S/P BKA (below knee amputation) unilateral ([place])   . Medical non-compliance   . Benign skin lesion of multiple sites   . Tachypnea   . Acute blood loss anemia   . Neurogenic bladder   . Occult blood positive stool   .  Rectovaginal fistula   . Controlled diabetes mellitus type 2 with complications (HCC)   . Acute on chronic renal failure (HCC)   . Hydronephrosis determined by ultrasound   . Papular rash   . Uncontrolled type 2 diabetes mellitus with complication (HCC)   . Paresthesia   . Thrombocytopenia (HCC) 08/23/2015  . Pressure ulcer 08/23/2015  . Severe anemia 08/23/2015  . Lower urinary tract infectious disease 08/23/2015  . Absolute anemia 08/23/2015  . Peripheral vascular disease, unspecified (HCC) 04/27/2012  . Unilateral complete BKA (HCC) 07/09/2011  . Gas gangrene (HCC) 07/02/2011  . Sepsis(995.91) 07/02/2011  . Acute renal failure (HCC) 07/02/2011  . Diabetes mellitus  (HCC) 11/22/2010  . Diabetic foot ulcer associated with type 2 diabetes mellitus (HCC)   . Hypertension   . Arthritis   . Neuropathy (HCC)   . Intracerebral hemorrhage [facility])     Past Surgical History:  Procedure Laterality Date  . AMPUTATION  11/24/2010   Procedure: AMPUTATION FOOT;  Surgeon: [name], MD;  Location: [facility] OR;  Service: Orthopedics;  Laterality: Right;  Right  FOOT TRANS METATARSAL AMPUTATION  . AMPUTATION  07/02/2011   Procedure: AMPUTATION BELOW KNEE;  Surgeon: [name], MD;  [facility] OR;  Service: Orthopedics;  Laterality: Right;  . APPLICATION OF WOUND VAC  07/02/2011   Procedure: APPLICATION OF WOUND VAC;  Surgeon: [name], MD;  Location: [facility] OR;  Service: Orthopedics;  Laterality: Right;  . BRAIN SURGERY  1980's   Previous brain surgery in [place], [place]  . CESAREAN SECTION  2004; 2006  . COLONOSCOPY N/A 09/17/2015   Procedure: COLONOSCOPY;  Surgeon: Charlott Rakes, MD;  Location: [facility] ENDOSCOPY;  Service: Endoscopy;  Laterality: N/A;  . ESOPHAGOGASTRODUODENOSCOPY N/A 09/17/2015   Procedure: ESOPHAGOGASTRODUODENOSCOPY (EGD);  Surgeon: Charlott Rakes, MD;  Location: [facility] ENDOSCOPY;  Service: Endoscopy;  Laterality: N/A;  . I&D EXTREMITY  11/24/2010   Procedure: IRRIGATION AND DEBRIDEMENT EXTREMITY;  Surgeon: [name], MD;  [facility] OR;  Service: Orthopedics;  Laterality: Left;  Debriedment of left plantar ulcer and trimming of toenails  . I&D EXTREMITY  07/02/2011   Procedure: IRRIGATION AND DEBRIDEMENT EXTREMITY;  Surgeon: [name], MD;  Location: [facility] OR;  Service: Orthopedics;  Laterality: Left;  I & D of Left Foot  . I&D EXTREMITY  07/06/2011   Procedure: IRRIGATION AND DEBRIDEMENT EXTREMITY;  Surgeon: [name], MD;  Location: [facility] OR;  Service: Orthopedics;  Laterality: Right;  IRRIGATION/DEBRIDEMENT RIGHT BELOW KNEE AMPUTATION  . TUBAL LIGATION  2006     OB History   No obstetric history on file.      Home Medications    Prior to Admission  medications   Medication Sig Start Date End Date Taking? Authorizing Provider  acetaminophen (TYLENOL) 500 MG tablet Take 500-1,000 mg by mouth every 6 (six) hours as needed (for pain).    [provider]  aspirin EC 81 MG tablet Take 81 mg by mouth daily as needed (chest pain).    [provider]  calcium carbonate (TUMS - DOSED IN MG ELEMENTAL CALCIUM) 500 MG chewable tablet Chew 2 tablets (400 mg of elemental calcium total) by mouth 3 (three) times daily. Patient taking differently: Chew 2 tablets by mouth 2 (two) times daily.  09/18/15   Love, Evlyn Kanner, PA-C  cholecalciferol 5000 units TABS Take 1 tablet (5,000 Units total) by mouth daily. 03/08/17   [place] Morita, PA-C  doxycycline (VIBRAMYCIN) 100 MG capsule Take 1 capsule (100 mg total) by mouth 2 (  two) times daily. 02/06/18   Rayburn, Fanny BienShawn [place], PA-C  gabapentin (NEURONTIN) 400 MG capsule Take 400 mg by mouth See admin instructions. Take 400 mg by mouth two times a day, and an additional 400 mg as needed for nerve pain 12/01/17   [provider]  HYDROcodone-acetaminophen (NORCO/VICODIN) 5-325 MG tablet Take 1 tablet by mouth every 6 (six) hours as needed for moderate pain. Patient taking differently: Take 1 tablet by mouth every 6 (six) hours as needed for moderate pain or severe pain.  03/08/17   Mon[place]z MoritaPaul, Keith, PA-C  Menthol, Topical Analgesic, (ICY HOT EX) Apply 1 application topically 2 (two) times daily as needed (pain).    [provider]  metFORMIN (GLUCOPHAGE) 500 MG tablet Take 1 tablet (500 mg total) by mouth 2 (two) times daily with a meal. 01/30/18 01/30/19  Calvert Cantorizwan, Saima, MD  Naphazoline HCl (CLEAR EYES OP) Place 1 drop into both eyes daily as needed (dry eyes/itching).    [provider]  Nutritional Supplements (FEEDING SUPPLEMENT, BOOST BREEZE,) LIQD Take 1 Bottle by mouth 3 (three) times daily with meals. 03/08/17   Montez MoritaPaul, Keith, PA-C  povidone-iodine (BETADINE) 10 % external solution Apply  topically 2 (two) times daily. 01/30/18   Calvert Cantorizwan, Saima, MD    Family History Family History  Problem Relation Age of Onset  . Diabetes Mother   . Hypertension Mother   . Hyperlipidemia Mother   . Diabetes Father   . Cancer Father        prostate  . Hyperlipidemia Father   . Hypertension Father   . Diabetes Brother   . Peripheral Artery Disease Brother        has had toes amputated    Social History Social History   Tobacco Use  . Smoking status: Never Smoker  . Smokeless tobacco: Never Used  Substance Use Topics  . Alcohol use: Yes    Alcohol/week: 2.0 standard drinks    Types: 2 Glasses of wine per week  . Drug use: No     Allergies   Patient has no known allergies.   Review of Systems Review of Systems  All other systems reviewed and are negative.    Physical Exam Updated Vital Signs BP (!) 152/70 (BP Location: Left Arm)   Pulse 100   Temp 98.4 F (36.9 C) (Oral)   Resp 18   Wt 108 kg   SpO2 95%   BMI 37.29 kg/m   Physical Exam Vitals signs and nursing note reviewed.  Constitutional:      Appearance: She is well-developed.  HENT:     Head: Normocephalic and atraumatic.     Comments: Dried blood in the left nare, no active bleeding, no obvious lesions noted bilateral nares are patent Eyes:     General: No scleral icterus.       Right eye: No discharge.        Left eye: No discharge.     Conjunctiva/sclera: Conjunctivae normal.     Pupils: Pupils are equal, round, and reactive to light.  Neck:     Musculoskeletal: Normal range of motion.     Vascular: No JVD.     Trachea: No tracheal deviation.  Pulmonary:     Effort: Pulmonary effort is normal.     Breath sounds: No stridor.  Neurological:     Mental Status: She is alert and oriented to person, place, and time.     Coordination: Coordination normal.  Psychiatric:  Behavior: Behavior normal.        Thought Content: Thought content normal.        Judgment: Judgment normal.       ED Treatments / Results  Labs (all labs ordered are listed, but only abnormal results are displayed) Labs Reviewed - No data to display  EKG None  Radiology No results found.  Procedures Procedures (including critical care time)  Medications Ordered in ED Medications  oxymetazoline (AFRIN) 0.05 % nasal spray 1 spray (1 spray Each Nare Given by Other 09/13/18 1540)     Initial Impression / Assessment and Plan / ED Course  I have reviewed the triage vital signs and the nursing notes.  Pertinent labs & imaging results that were available during my care of the patient were reviewed by me and considered in my medical decision making (see chart for details).        53 year old female presents today with epistaxis.  She received Afrin prior to my evaluation.  She is monitored here in the ED, she had no subsequent bleeding.  Patient will be discharged at this time she will return immediately she develops any reoccurring bleed.  She has no history of abnormal bleeding and is not currently on anticoagulants.  She is given strict return cautions, she verbalized understanding and agreement to today's plan had no further questions or concerns.  Final Clinical Impressions(s) / ED Diagnoses   Final diagnoses:  Epistaxis    ED Discharge Orders    None       Rosalio LoudHedges, Lenzy Kerschner, PA-C 09/13/18 1834    Charlynne PanderYao, David Hsienta, MD 09/13/18 [phone]

## 2018-09-13 NOTE — Discharge Instructions (Addendum)
Please read attached information. If you experience any new or worsening signs or symptoms please return to the emergency room for evaluation. Please follow-up with your primary care provider or specialist as discussed.  °

## 2018-09-13 NOTE — ED Notes (Signed)
Pt states her nose stopped bleeding approx 15 min after afrin was administered.

## 2018-09-13 NOTE — ED Triage Notes (Signed)
Pt BIB [facility] for eval of nosebleed since 0500 this morning. Hx of HTN, initially 005 systolic for EMS, 110/21 on arrival. Reports she has used 2 rolls of paper towels since this AM.

## 2018-10-16 ENCOUNTER — Encounter: Payer: Self-pay | Admitting: Internal Medicine

## 2018-10-16 ENCOUNTER — Ambulatory Visit (INDEPENDENT_AMBULATORY_CARE_PROVIDER_SITE_OTHER): Payer: Medicaid Other | Admitting: Internal Medicine

## 2018-10-16 ENCOUNTER — Telehealth: Payer: Self-pay | Admitting: Internal Medicine

## 2018-10-16 ENCOUNTER — Other Ambulatory Visit: Payer: Self-pay

## 2018-10-16 VITALS — BP 190/98 | HR 68 | Resp 12 | Wt 208.0 lb

## 2018-10-16 DIAGNOSIS — I10 Essential (primary) hypertension: Secondary | ICD-10-CM

## 2018-10-16 DIAGNOSIS — H269 Unspecified cataract: Secondary | ICD-10-CM | POA: Insufficient documentation

## 2018-10-16 DIAGNOSIS — B351 Tinea unguium: Secondary | ICD-10-CM | POA: Insufficient documentation

## 2018-10-16 DIAGNOSIS — Z89511 Acquired absence of right leg below knee: Secondary | ICD-10-CM

## 2018-10-16 DIAGNOSIS — E1159 Type 2 diabetes mellitus with other circulatory complications: Secondary | ICD-10-CM

## 2018-10-16 DIAGNOSIS — E041 Nontoxic single thyroid nodule: Secondary | ICD-10-CM

## 2018-10-16 DIAGNOSIS — B353 Tinea pedis: Secondary | ICD-10-CM | POA: Insufficient documentation

## 2018-10-16 DIAGNOSIS — H543 Unqualified visual loss, both eyes: Secondary | ICD-10-CM | POA: Insufficient documentation

## 2018-10-16 LAB — GLUCOSE, POCT (MANUAL RESULT ENTRY): POC Glucose: 117 mg/dl — AB (ref 70–99)

## 2018-10-16 MED ORDER — GABAPENTIN 400 MG PO CAPS: 400.0000 mg | ORAL_CAPSULE | Freq: Three times a day (TID) | ORAL | 11 refills | Status: DC

## 2018-10-16 MED ORDER — AMLODIPINE BESYLATE 5 MG PO TABS: 5.0000 mg | ORAL_TABLET | Freq: Every day | ORAL | 11 refills | Status: DC

## 2018-10-16 MED ORDER — METFORMIN HCL 500 MG PO TABS: 500.0000 mg | ORAL_TABLET | Freq: Two times a day (BID) | ORAL | 11 refills | Status: DC

## 2018-10-16 MED ORDER — TERBINAFINE HCL 1 % EX CREA: 1.0000 "application " | TOPICAL_CREAM | Freq: Two times a day (BID) | CUTANEOUS | 0 refills | Status: DC

## 2018-10-16 NOTE — Patient Instructions (Signed)
Recommend flu vaccine  Recommend pneumococcal vaccine 23 v if you have not had

## 2018-10-16 NOTE — Telephone Encounter (Signed)
Notify she needs to take Amlodipine for her high blood pressure in clinic today. Sent Rx to [facility].  She will need and ultrasound of her thyroid.  Had a CT of chest in past that showed a nodule in her right thyroid that needs follow up.  Blood testing for function was normal.  Have her come in as soon as possible for CBC in citrated tube (please write that out specifically for [name]) for low platelets (thrombocytopenia) and for FLP

## 2018-10-16 NOTE — Progress Notes (Addendum)
Subjective:    Patient ID: [name], female   DOB: 1965/05/01, 53 y.o.   MRN: [id number]   HPI   Here to establish  1.  History of Right BKA in 2013:  Had foot amputated for infection in 2012.  Nonhealing diabetic ulcer reoccurrence a year later with BKA.  Did have some sort of prosthesis, but cannot get focus on why did not learn to ambulate with the prosthesis.  States had problems with insurance and never able to get back to learn how to use it.   Has basically been wheelchair bound since 2013. No wheelchair ramp to front door.   Door from garage is at same level, so this is her only entry. Unable to get upstairs to a full bath Cannot get clarity on whether she has grab bars or other safety features in home.  2.  DM:  Does not check sugars as does not have a meter. Not taking Metformin.  She cannot say why as she has the medication. Does not like to take medications if she doesn't need them Discussed she needs Metformin Last A1C 01/25/2018 was 7.5% Had her last eye check in June 2020.  Has not had a flu shot yet. Has not had pneumococcal vaccine. Numbness and tingling in left foot for years. Takes Gabapentin 400 mg twice daily.  Runs out of it at times.  Sometimes taking 3 times daily.   3.  Decreased Vision:  Last visit as above.  Was told has need for cataract extraction for both eyes, but right eye most involved.   States she had a small hemorrhagic stroke affecting left brain and lower right visual field as a teenager.   She is not aware of diabetic retinopathy--has not had any laser or other surgery for her eyes. Does not have disability/Medicaid--turned down last in 09/2017 for ?financial as has husbands social security monthly.  4.  Hyperlipidemia:  Last check in this chart was 08/26/2015.  Suspect on Statin at the time.  She cannot say. Lipid Panel     Component Value Date/Time   CHOL 80 08/26/2015 1132   TRIG 98 08/26/2015 1132   HDL 26 (L) 08/26/2015 1132    CHOLHDL 3.1 08/26/2015 1132   VLDL 20 08/26/2015 1132   LDLCALC 34 08/26/2015 1132   5.  Enlarged thyroid:  Cannot say if she has knowledge of previous thyroid disease.  This asked after exam.  6.  CKD:  Found in records.  Most creatinine was 1.42 in January with estimated GFR of 49.  7.  Social:  Lives alone.  Her 75 yo daughter and 68 yo son are in foster care as they were removed from her care.   When asked why, she states she was told her son was not getting adequate care for his diabetes. She is working on getting them back.  Current Meds  Medication Sig  . acetaminophen (TYLENOL) 500 MG tablet Take 500-1,000 mg by mouth every 6 (six) hours as needed (for pain).  Marland Kitchen aspirin EC 81 MG tablet Take 81 mg by mouth daily as needed (chest pain).  Marland Kitchen gabapentin (NEURONTIN) 400 MG capsule Take 400 mg by mouth See admin instructions. Take 400 mg by mouth two times a day, and an additional 400 mg as needed for nerve pain  . Menthol, Topical Analgesic, (ICY HOT EX) Apply 1 application topically 2 (two) times daily as needed (pain).  . Naphazoline HCl (CLEAR EYES OP) Place 1 drop into  both eyes daily as needed (dry eyes/itching).   No Known Allergies   Past Medical History:  Diagnosis Date  . Anemia   . Arthritis    "spine, hands" (11/25/2015)  . Back pain    "all my back; probably 3 times/week" (11/25/2015)  . Chronic indwelling Foley catheter 03/04/2017  . Diabetic foot ulcer associated with type 2 diabetes mellitus (HCC)   . Hypertension   . Intracerebral hemorrhage (HCC) As a teenager    States she had burst blood vessel as teenager, now with resultant minor visual field and hearing deficits  . Neuropathy   . Stroke [facility](HCC) ~ 1982   "my feeling on my RLE, right lower eye vision,  & hearing out of my right ear not the same since" (11/25/2015)  . Type II diabetes mellitus (HCC)    Past Surgical History:  Procedure Laterality Date  . AMPUTATION  11/24/2010   Procedure: AMPUTATION  FOOT;  Surgeon: Toni ArthursJohn Hewitt, MD;  [facility];  Service: Orthopedics;  Laterality: Right;  Right  FOOT TRANS METATARSAL AMPUTATION  . AMPUTATION  07/02/2011   Procedure: AMPUTATION BELOW KNEE;  Surgeon: Toni ArthursJohn Hewitt, MD;  Location: [facility] OR;  Service: Orthopedics;  Laterality: Right;  . APPLICATION OF WOUND VAC  07/02/2011   Procedure: APPLICATION OF WOUND VAC;  Surgeon: Toni ArthursJohn Hewitt, MD;  Location: [facility] OR;  Service: Orthopedics;  Laterality: Right;  . BRAIN SURGERY  1980's   [place]  . CESAREAN SECTION  2004; 2006  . COLONOSCOPY N/A 09/17/2015   Procedure: COLONOSCOPY;  Surgeon: Charlott RakesVincent Schooler, MD;  [facility];  Service: Endoscopy;  Laterality: N/A;  . ESOPHAGOGASTRODUODENOSCOPY N/A 09/17/2015   Procedure: ESOPHAGOGASTRODUODENOSCOPY (EGD);  Surgeon: Charlott RakesVincent Schooler, MD;  [facility];  Service: Endoscopy;  Laterality: N/A;  . I&D EXTREMITY  11/24/2010   Procedure: IRRIGATION AND DEBRIDEMENT EXTREMITY;  Surgeon: Toni ArthursJohn Hewitt, MD;  Location: [facility] OR;  Service: Orthopedics;  Laterality: Left;  Debriedment of left plantar ulcer and trimming of toenails  . I&D EXTREMITY  07/02/2011   Procedure: IRRIGATION AND DEBRIDEMENT EXTREMITY;  Surgeon: Toni ArthursJohn Hewitt, MD;  Location: [facility];  Service: Orthopedics;  Laterality: Left;  I & D of Left Foot  . I&D EXTREMITY  07/06/2011   Procedure: IRRIGATION AND DEBRIDEMENT EXTREMITY;  Surgeon: Toni ArthursJohn Hewitt, MD;  Location: [facility];  Service: Orthopedics;  Laterality: Right;  IRRIGATION/DEBRIDEMENT RIGHT BELOW KNEE AMPUTATION  . TUBAL LIGATION  2006   Family History  Problem Relation Age of Onset  . Diabetes Mother   . Hypertension Mother   . Hyperlipidemia Mother   . Diabetes Father   . Cancer Father        prostate  . Hyperlipidemia Father   . Hypertension Father   . Diabetes Brother   . Peripheral Artery Disease Brother        has had toes amputated  . Diabetes Son    Social History   Socioeconomic History  .  Marital status: Widowed    Spouse name: Not on file  . Number of children: Not on file  . Years of education: Not on file  . Highest education level: Not on file  Occupational History  . Not on file  Social Needs  . Financial resource strain: Not on file  . Food insecurity    Worry: Not on file    Inability: Not on file  . Transportation needs    Medical: Not on file    Non-medical: Not  on file  Tobacco Use  . Smoking status: Never Smoker  . Smokeless tobacco: Never Used  Substance and Sexual Activity  . Alcohol use: Yes    Alcohol/week: 2.0 standard drinks    Types: 2 Glasses of wine per week  . Drug use: No  . Sexual activity: Never    Birth control/protection: Post-menopausal  Lifestyle  . Physical activity    Days per week: Not on file    Minutes per session: Not on file  . Stress: Not on file  Relationships  . Social Musician on phone: Not on file    Gets together: Not on file    Attends religious service: Not on file    Active member of club or organization: Not on file    Attends meetings of clubs or organizations: Not on file    Relationship status: Not on file  . Intimate partner violence    Fear of current or ex partner: Not on file    Emotionally abused: Not on file    Physically abused: Not on file    Forced sexual activity: Not on file  Other Topics Concern  . Not on file  Social History Narrative   Widowed in 2012   Lives in a home she owns   No wheelchair ramp, though garage entry is flush to both garage and entry floor.   Unable to use full bathroom upstairs.   Has prosthesis for right LE, but never learned how to use.   Cannot see well due to cataracts.   Did not receive disability in 09/2017 with application.   Possibly due to husband's SS she receives     Review of Systems    Objective:   BP (!) 190/98 (BP Location: Left Arm, Patient Position: Sitting, Cuff Size: Normal)   Pulse 68   Resp 12   Wt 208 lb (94.3 kg)   LMP   (LMP Unknown)   BMI 32.58 kg/m   Physical Exam  NAD HEENT:  Dense cataracts bilaterally--no light reflex.   Neck:  Supple, No adenopathy, enlarged nodular thyroid. Chest:  CTA CV:  RRR with normal S1 and S2, No S3, S4 or murmur.  No carotid bruits.  Carotid, radial, DP pulses normal and equal (no right foot) Abd:  S, + BS LLE:  Flaking of foot with thickened very discolored and deformed great toenail.  No ulcers. RLE stump:  Tissue pink and healthy appearing.   Assessment & Plan  1.  Wheelchair bound:  Safety concerns as no ramp and also not clear if her home has ever been assessed for safety. Will ask SW intern to contact [employer] for ramp and to assess home for disability. Would also like to see if her prosthesis is still usable:  Referral to [facility].  2.  Transportation:  SW intern for [employer] application.  3.  Disability:  Not clear if she just needs representation for this.  Also to see about Medicaid, Medicare.   Referral to [employer] to assist in this--fax sent.  4.  Affordable med:  [facility]ange card app for now--will see again if SW intern can help her with this.  5.  Nodular thyroid goiter:  TSH 08/24/2015 was elevated at 5.461 and normal at 2.012 on 03/07/2017.  Not clear whether was taking any hormone replacement at the time--nothing in this chart's past med list. CT of chest also showed 4.1 cm left thyroid nodule 03/07/2017. I do not  see a follow up ultrasound Needs orange card for U/S  6.  Hypertension:  Add Amlodipine to [facility].  5 mg daily.    7.  DM:  To get back on Metformin.  Fasting labs in 3 months before next visit. Needs influenza vaccine--will come for clinic this Thursday. Sending to [facility] to see if had Pneumococcal vaccine there  She did not want labs today as stated had just obtained in past month at [facility].  8.  Poor vision:  Cataracts and suspect likely diabetic eye disease.   Needs to apply for Medicaid, get turned down and then can send to [facility] for cataracts extraction.  9.  Tinea pedis:  Terbinafine cream to foot twice daily for 14 days or more.  Received records this afternoon from [facility].  Last labs obtained 09/06/2018 and as follows:   A1C:  7.2% Not clear if taking Metformin then. TSH:  1.99 BUN/Crea:  21/1.16 WBC:  4.1 Hgb/Hct:  11.5/35.3 Plt:  2 Patient was called for the latter to go to ED, which she refused to do and does not appear she has had a follow up of labs since. She did not appear to have bleeding, spontaneously or otherwise. No FLP noted. Will have her return for CBC in a citrated tube as well as FLP

## 2018-10-20 ENCOUNTER — Other Ambulatory Visit: Payer: Medicaid Other

## 2018-11-02 ENCOUNTER — Other Ambulatory Visit: Payer: Self-pay

## 2018-11-02 ENCOUNTER — Other Ambulatory Visit (INDEPENDENT_AMBULATORY_CARE_PROVIDER_SITE_OTHER): Payer: Self-pay

## 2018-11-02 DIAGNOSIS — Z13220 Encounter for screening for lipoid disorders: Secondary | ICD-10-CM

## 2018-11-02 DIAGNOSIS — D696 Thrombocytopenia, unspecified: Secondary | ICD-10-CM

## 2018-11-03 LAB — SPECIMEN STATUS

## 2018-11-04 ENCOUNTER — Encounter (HOSPITAL_COMMUNITY): Payer: Self-pay | Admitting: Emergency Medicine

## 2018-11-04 ENCOUNTER — Inpatient Hospital Stay (HOSPITAL_COMMUNITY)
Admission: EM | Admit: 2018-11-04 | Discharge: 2018-11-13 | DRG: 813 | Disposition: A | Discharge status: Home / Self Care | Payer: Self-pay | Attending: Family Medicine | Admitting: Family Medicine

## 2018-11-04 ENCOUNTER — Other Ambulatory Visit: Payer: Self-pay

## 2018-11-04 ENCOUNTER — Emergency Department (HOSPITAL_COMMUNITY): Payer: Self-pay

## 2018-11-04 DIAGNOSIS — D696 Thrombocytopenia, unspecified: Secondary | ICD-10-CM | POA: Diagnosis present

## 2018-11-04 DIAGNOSIS — E1140 Type 2 diabetes mellitus with diabetic neuropathy, unspecified: Secondary | ICD-10-CM | POA: Diagnosis present

## 2018-11-04 DIAGNOSIS — I10 Essential (primary) hypertension: Secondary | ICD-10-CM | POA: Diagnosis present

## 2018-11-04 DIAGNOSIS — R300 Dysuria: Secondary | ICD-10-CM | POA: Diagnosis present

## 2018-11-04 DIAGNOSIS — E871 Hypo-osmolality and hyponatremia: Secondary | ICD-10-CM | POA: Diagnosis present

## 2018-11-04 DIAGNOSIS — E11649 Type 2 diabetes mellitus with hypoglycemia without coma: Secondary | ICD-10-CM | POA: Diagnosis present

## 2018-11-04 DIAGNOSIS — E1136 Type 2 diabetes mellitus with diabetic cataract: Secondary | ICD-10-CM | POA: Diagnosis present

## 2018-11-04 DIAGNOSIS — G9389 Other specified disorders of brain: Secondary | ICD-10-CM | POA: Diagnosis present

## 2018-11-04 DIAGNOSIS — R310 Gross hematuria: Secondary | ICD-10-CM | POA: Diagnosis present

## 2018-11-04 DIAGNOSIS — Z6837 Body mass index (BMI) 37.0-37.9, adult: Secondary | ICD-10-CM

## 2018-11-04 DIAGNOSIS — N183 Chronic kidney disease, stage 3 unspecified: Secondary | ICD-10-CM | POA: Diagnosis present

## 2018-11-04 DIAGNOSIS — E538 Deficiency of other specified B group vitamins: Secondary | ICD-10-CM | POA: Diagnosis present

## 2018-11-04 DIAGNOSIS — Z8349 Family history of other endocrine, nutritional and metabolic diseases: Secondary | ICD-10-CM

## 2018-11-04 DIAGNOSIS — N179 Acute kidney failure, unspecified: Secondary | ICD-10-CM | POA: Diagnosis present

## 2018-11-04 DIAGNOSIS — E875 Hyperkalemia: Secondary | ICD-10-CM | POA: Diagnosis present

## 2018-11-04 DIAGNOSIS — K921 Melena: Secondary | ICD-10-CM | POA: Diagnosis present

## 2018-11-04 DIAGNOSIS — Z8042 Family history of malignant neoplasm of prostate: Secondary | ICD-10-CM

## 2018-11-04 DIAGNOSIS — Z20828 Contact with and (suspected) exposure to other viral communicable diseases: Secondary | ICD-10-CM | POA: Diagnosis present

## 2018-11-04 DIAGNOSIS — R195 Other fecal abnormalities: Secondary | ICD-10-CM | POA: Diagnosis present

## 2018-11-04 DIAGNOSIS — R339 Retention of urine, unspecified: Secondary | ICD-10-CM | POA: Diagnosis present

## 2018-11-04 DIAGNOSIS — Z833 Family history of diabetes mellitus: Secondary | ICD-10-CM

## 2018-11-04 DIAGNOSIS — E669 Obesity, unspecified: Secondary | ICD-10-CM | POA: Diagnosis present

## 2018-11-04 DIAGNOSIS — I129 Hypertensive chronic kidney disease with stage 1 through stage 4 chronic kidney disease, or unspecified chronic kidney disease: Secondary | ICD-10-CM | POA: Diagnosis present

## 2018-11-04 DIAGNOSIS — Z8673 Personal history of transient ischemic attack (TIA), and cerebral infarction without residual deficits: Secondary | ICD-10-CM

## 2018-11-04 DIAGNOSIS — D649 Anemia, unspecified: Secondary | ICD-10-CM | POA: Diagnosis present

## 2018-11-04 DIAGNOSIS — Z8249 Family history of ischemic heart disease and other diseases of the circulatory system: Secondary | ICD-10-CM

## 2018-11-04 DIAGNOSIS — E119 Type 2 diabetes mellitus without complications: Secondary | ICD-10-CM

## 2018-11-04 DIAGNOSIS — E1122 Type 2 diabetes mellitus with diabetic chronic kidney disease: Secondary | ICD-10-CM | POA: Diagnosis present

## 2018-11-04 DIAGNOSIS — D693 Immune thrombocytopenic purpura: Principal | ICD-10-CM | POA: Diagnosis present

## 2018-11-04 DIAGNOSIS — Z6836 Body mass index (BMI) 36.0-36.9, adult: Secondary | ICD-10-CM

## 2018-11-04 DIAGNOSIS — G4733 Obstructive sleep apnea (adult) (pediatric): Secondary | ICD-10-CM | POA: Diagnosis present

## 2018-11-04 DIAGNOSIS — Z7984 Long term (current) use of oral hypoglycemic drugs: Secondary | ICD-10-CM

## 2018-11-04 DIAGNOSIS — N1832 Chronic kidney disease, stage 3b: Secondary | ICD-10-CM | POA: Diagnosis present

## 2018-11-04 DIAGNOSIS — E039 Hypothyroidism, unspecified: Secondary | ICD-10-CM | POA: Diagnosis present

## 2018-11-04 DIAGNOSIS — Z79899 Other long term (current) drug therapy: Secondary | ICD-10-CM

## 2018-11-04 DIAGNOSIS — Z89511 Acquired absence of right leg below knee: Secondary | ICD-10-CM

## 2018-11-04 DIAGNOSIS — T148XXA Other injury of unspecified body region, initial encounter: Secondary | ICD-10-CM

## 2018-11-04 LAB — CBC WITH DIFFERENTIAL/PLATELET
Abs Immature Granulocytes: 0.01 10*3/uL (ref 0.00–0.07)
Basophils Absolute: 0 10*3/uL (ref 0.0–0.1)
Basophils Relative: 1 %
Eosinophils Absolute: 0.2 10*3/uL (ref 0.0–0.5)
Eosinophils Relative: 5 %
HCT: 32.7 % — ABNORMAL LOW (ref 36.0–46.0)
Hemoglobin: 10.1 g/dL — ABNORMAL LOW (ref 12.0–15.0)
Immature Granulocytes: 0 %
Lymphocytes Relative: 22 %
Lymphs Abs: 1 10*3/uL (ref 0.7–4.0)
MCH: 30.2 pg (ref 26.0–34.0)
MCHC: 30.9 g/dL (ref 30.0–36.0)
MCV: 97.9 fL (ref 80.0–100.0)
Monocytes Absolute: 0.4 10*3/uL (ref 0.1–1.0)
Monocytes Relative: 9 %
Neutro Abs: 2.9 10*3/uL (ref 1.7–7.7)
Neutrophils Relative %: 63 %
Platelets: <5 10*3/uL — CL (ref 150–400)
RBC: 3.34 MIL/uL — ABNORMAL LOW (ref 3.87–5.11)
RDW: 14.7 % (ref 11.5–15.5)
WBC: 4.5 10*3/uL (ref 4.0–10.5)
nRBC: 0 % (ref 0.0–0.2)

## 2018-11-04 LAB — BASIC METABOLIC PANEL
Anion gap: 11 (ref 5–15)
BUN: 34 mg/dL — ABNORMAL HIGH (ref 6–20)
CO2: 18 mmol/L — ABNORMAL LOW (ref 22–32)
Calcium: 8.8 mg/dL — ABNORMAL LOW (ref 8.9–10.3)
Chloride: 107 mmol/L (ref 98–111)
Creatinine, Ser: 1.53 mg/dL — ABNORMAL HIGH (ref 0.44–1.00)
GFR calc Af Amer: 45 mL/min — ABNORMAL LOW (ref >60)
GFR calc non Af Amer: 39 mL/min — ABNORMAL LOW (ref >60)
Glucose, Bld: 154 mg/dL — ABNORMAL HIGH (ref 70–99)
Potassium: 5.4 mmol/L — ABNORMAL HIGH (ref 3.5–5.1)
Sodium: 136 mmol/L (ref 135–145)

## 2018-11-04 LAB — URINALYSIS, ROUTINE W REFLEX MICROSCOPIC
Bacteria, UA: NONE SEEN
RBC / HPF: >50 RBC/hpf — ABNORMAL HIGH (ref 0–5)

## 2018-11-04 LAB — POC OCCULT BLOOD, ED: Fecal Occult Bld: POSITIVE — AB

## 2018-11-04 NOTE — ED Notes (Signed)
Platelets < 5

## 2018-11-04 NOTE — ED Notes (Signed)
Patient transported to CT 

## 2018-11-04 NOTE — ED Triage Notes (Signed)
Patient reports hematuria/dysuria onset yesterday with bladder pressure and urinary urgency  , denies fever or chills .

## 2018-11-04 NOTE — ED Provider Notes (Signed)
[facility] EMERGENCY DEPARTMENT Provider Note   CSN: [id number] Arrival date & time: 11/04/18  1955     History   Chief Complaint Chief Complaint  Patient presents with  . Hematuria    HPI [name] is a 53 y.o. female.     The history is provided by the patient and medical records.     53 year old female with history of anemia, thrombocytopenia, arthritis, diabetic foot ulcer status post right BKA, hypertension, history of intracranial hemorrhage, DM2, presenting to the ED with hematuria.  States she started noticing this on Friday has been persistent since that time.  Has had a little discomfort with urination.  Initially it was just in the urine, some dark black specks, but now she is noticing some blood in the stool as well.  Blood in the urine has now transitioned to bright red.  She has had some bruising of the extremities despite no recent falls or trauma.  She is not having any headaches or confusion.  No nosebleeds.  She is not currently on anticoagulation.  Past Medical History:  Diagnosis Date  . Anemia   . Arthritis    "spine, hands" (11/25/2015)  . Back pain    "all my back; probably 3 times/week" (11/25/2015)  . Chronic indwelling Foley catheter 03/04/2017  . Diabetic foot ulcer associated with type 2 diabetes mellitus ([place])   . Hypertension   . Intracerebral hemorrhage ([place]) As a teenager    States she had burst blood vessel as teenager, now with resultant minor visual field and hearing deficits  . Neuropathy   . Stroke [facility]) ~ 1982   "my feeling on my RLE, right lower eye vision,  & hearing out of my right ear not the same since" (11/25/2015)  . Type II diabetes mellitus [facility])     Patient Active Problem List   Diagnosis Date Noted  . Onychomycosis of toenail 10/16/2018  . Cataract of both eyes 10/16/2018  . Tinea pedis of left foot 10/16/2018  . Decreased vision in both eyes 10/16/2018  . CKD (chronic kidney disease) stage 3,  GFR 30-59 ml/min   . Cellulitis of left lower extremity 01/25/2018  . Hyperkalemia 01/25/2018  . Poor compliance 03/08/2017  . Hypoxia 03/07/2017  . Thyroid nodule 03/07/2017  . Chronic indwelling Foley catheter 03/04/2017  . Type II diabetes mellitus ([place])   . Femur fracture ([place]) 03/02/2017  . Orthostatic dizziness   . Urinary tract infection without hematuria   . Enterococcus faecalis infection   . Near syncope 11/25/2015  . Dehydration 11/25/2015  . Orthostatic hypotension 11/25/2015  . UTI (urinary tract infection) 11/25/2015  . Acute worsening of stage 3 chronic kidney disease 09/24/2015  . Diabetes mellitus due to underlying condition with chronic kidney disease, without long-term current use of insulin ([place])   . Fever   . Status post below knee amputation of right lower extremity ([place])   . Diabetic peripheral neuropathy ([place])   . Neuropathic pain   . Benign essential HTN   . Folliculitis   . Slow transit constipation   . Anemia of chronic disease   . Bilateral hydronephrosis   . Urinary retention   . CKD (chronic kidney disease)   . Uncontrolled type 2 diabetes mellitus with diabetic nephropathy, without long-term current use of insulin ([place])   . Vasculopathy   . Debility 09/04/2015  . DM type 2 with diabetic peripheral neuropathy ([place])   . S/P BKA (below knee amputation) unilateral ([place])   .  Medical non-compliance   . Benign skin lesion of multiple sites   . Tachypnea   . Acute blood loss anemia   . Neurogenic bladder   . Occult blood positive stool   . Rectovaginal fistula   . Controlled diabetes mellitus type 2 with complications ([place])   . Acute on chronic renal failure ([place])   . Hydronephrosis determined by ultrasound   . Papular rash   . Uncontrolled type 2 diabetes mellitus with complication ([place])   . Paresthesia   . Thrombocytopenia ([place]) 08/23/2015  . Pressure ulcer 08/23/2015  . Severe anemia 08/23/2015  . Lower urinary tract infectious disease  08/23/2015  . Absolute anemia 08/23/2015  . Peripheral vascular disease, unspecified ([place]) 04/27/2012  . Unilateral complete BKA ([place]) 07/09/2011  . Gas gangrene ([place]) 07/02/2011  . Sepsis(995.91) 07/02/2011  . Acute renal failure ([place]) 07/02/2011  . Diabetes mellitus ([place]) 11/22/2010  . Diabetic foot ulcer associated with type 2 diabetes mellitus ([place])   . Essential hypertension   . Arthritis   . Neuropathy ([place])   . Intracerebral hemorrhage [facility])     Past Surgical History:  Procedure Laterality Date  . AMPUTATION  11/24/2010   Procedure: AMPUTATION FOOT;  Surgeon: Wylene Simmer, MD;  Location: [facility];  Service: Orthopedics;  Laterality: Right;  Right  FOOT TRANS METATARSAL AMPUTATION  . AMPUTATION  07/02/2011   Procedure: AMPUTATION BELOW KNEE;  Surgeon: Wylene Simmer, MD;  Location: [facility];  Service: Orthopedics;  Laterality: Right;  . APPLICATION OF WOUND VAC  07/02/2011   Procedure: APPLICATION OF WOUND VAC;  Surgeon: Wylene Simmer, MD;  Location: [facility];  Service: Orthopedics;  Laterality: Right;  . BRAIN SURGERY  1980's   Previous brain surgery in [place], [place]  . CESAREAN SECTION  2004; 2006  . COLONOSCOPY N/A 09/17/2015   Procedure: COLONOSCOPY;  Surgeon: Wilford Corner, MD;  Location: [facility] ENDOSCOPY;  Service: Endoscopy;  Laterality: N/A;  . ESOPHAGOGASTRODUODENOSCOPY N/A 09/17/2015   Procedure: ESOPHAGOGASTRODUODENOSCOPY (EGD);  Surgeon: Wilford Corner, MD;  Location: [facility] ENDOSCOPY;  Service: Endoscopy;  Laterality: N/A;  . I&D EXTREMITY  11/24/2010   Procedure: IRRIGATION AND DEBRIDEMENT EXTREMITY;  Surgeon: Wylene Simmer, MD;  Location: [facility];  Service: Orthopedics;  Laterality: Left;  Debriedment of left plantar ulcer and trimming of toenails  . I&D EXTREMITY  07/02/2011   Procedure: IRRIGATION AND DEBRIDEMENT EXTREMITY;  Surgeon: Wylene Simmer, MD;  Location: [facility];  Service: Orthopedics;  Laterality: Left;  I & D of Left Foot  . I&D EXTREMITY  07/06/2011   Procedure: IRRIGATION AND  DEBRIDEMENT EXTREMITY;  Surgeon: Wylene Simmer, MD;  Location: [facility];  Service: Orthopedics;  Laterality: Right;  IRRIGATION/DEBRIDEMENT RIGHT BELOW KNEE AMPUTATION  . TUBAL LIGATION  2006     OB History   No obstetric history on file.      Home Medications    Prior to Admission medications   Medication Sig Start Date End Date Taking? Authorizing Provider  acetaminophen (TYLENOL) 500 MG tablet Take 500-1,000 mg by mouth every 6 (six) hours as needed (for pain).    [provider]  amLODipine (NORVASC) 5 MG tablet Take 1 tablet (5 mg total) by mouth daily. 10/16/18   Mack Hook, MD  aspirin EC 81 MG tablet Take 81 mg by mouth daily as needed (chest pain).    [provider]  calcium carbonate (TUMS - DOSED IN MG ELEMENTAL CALCIUM) 500 MG chewable tablet Chew 2 tablets (400 mg of elemental calcium total) by mouth  3 (three) times daily. Patient not taking: Reported on 10/16/2018 09/18/15   Love, Ivan Anchors, PA-C  cholecalciferol 5000 units TABS Take 1 tablet (5,000 Units total) by mouth daily. Patient not taking: Reported on 10/16/2018 03/08/17   Ainsley Spinner, PA-C  gabapentin (NEURONTIN) 400 MG capsule Take 1 capsule (400 mg total) by mouth 3 (three) times daily. Take 400 mg by mouth two times a day, and an additional 400 mg as needed for nerve pain 10/16/18   Mack Hook, MD  Menthol, Topical Analgesic, (ICY HOT EX) Apply 1 application topically 2 (two) times daily as needed (pain).    [provider]  metFORMIN (GLUCOPHAGE) 500 MG tablet Take 1 tablet (500 mg total) by mouth 2 (two) times daily with a meal. 10/16/18 10/16/19  Mack Hook, MD  Naphazoline HCl (CLEAR EYES OP) Place 1 drop into both eyes daily as needed (dry eyes/itching).    [provider]  terbinafine (LAMISIL) 1 % cream Apply 1 application topically 2 (two) times daily. 10/16/18   Mack Hook, MD    Family History Family History  Problem Relation Age of Onset   . Diabetes Mother   . Hypertension Mother   . Hyperlipidemia Mother   . Diabetes Father   . Cancer Father        prostate  . Hyperlipidemia Father   . Hypertension Father   . Diabetes Brother   . Peripheral Artery Disease Brother        has had toes amputated  . Diabetes Son     Social History Social History   Tobacco Use  . Smoking status: Never Smoker  . Smokeless tobacco: Never Used  Substance Use Topics  . Alcohol use: Yes    Alcohol/week: 2.0 standard drinks    Types: 2 Glasses of wine per week  . Drug use: No     Allergies   Patient has no known allergies.   Review of Systems Review of Systems  Gastrointestinal: Positive for blood in stool.  Genitourinary: Positive for hematuria.  Hematological: Bruises/bleeds easily.  All other systems reviewed and are negative.    Physical Exam Updated Vital Signs BP (!) 150/82   Pulse 89   Temp 98.9 F (37.2 C) (Oral)   Resp 17   LMP  (LMP Unknown)   SpO2 100%   Physical Exam Vitals signs and nursing note reviewed.  Constitutional:      Appearance: She is well-developed.  HENT:     Head: Normocephalic and atraumatic.     Comments: No blood in the oral mucousa or nasal passages Eyes:     Conjunctiva/sclera: Conjunctivae normal.     Pupils: Pupils are equal, round, and reactive to light.  Neck:     Musculoskeletal: Normal range of motion.  Cardiovascular:     Rate and Rhythm: Normal rate and regular rhythm.     Heart sounds: Normal heart sounds.  Pulmonary:     Effort: Pulmonary effort is normal.     Breath sounds: Normal breath sounds.  Abdominal:     General: Bowel sounds are normal.     Palpations: Abdomen is soft.  Genitourinary:    Comments: Slowly oozing blood from urethra, blood soaked urine in brief No gross blood on DRE, light brown stool Musculoskeletal: Normal range of motion.     Comments: Right BKA  Skin:    General: Skin is warm and dry.     Comments: Fairly large bruise to left  upper arm, some other small  bruises noted to the arms and legs  Neurological:     Mental Status: She is alert and oriented to person, place, and time.      ED Treatments / Results  Labs (all labs ordered are listed, but only abnormal results are displayed) Labs Reviewed  URINALYSIS, ROUTINE W REFLEX MICROSCOPIC - Abnormal; Notable for the following components:      Result Value   Color, Urine RED (*)    APPearance TURBID (*)    Glucose, UA   (*)    Value: TEST NOT REPORTED DUE TO COLOR INTERFERENCE OF URINE PIGMENT   Hgb urine dipstick   (*)    Value: TEST NOT REPORTED DUE TO COLOR INTERFERENCE OF URINE PIGMENT   Bilirubin Urine   (*)    Value: TEST NOT REPORTED DUE TO COLOR INTERFERENCE OF URINE PIGMENT   Ketones, ur   (*)    Value: TEST NOT REPORTED DUE TO COLOR INTERFERENCE OF URINE PIGMENT   Protein, ur   (*)    Value: TEST NOT REPORTED DUE TO COLOR INTERFERENCE OF URINE PIGMENT   Nitrite   (*)    Value: TEST NOT REPORTED DUE TO COLOR INTERFERENCE OF URINE PIGMENT   Leukocytes,Ua   (*)    Value: TEST NOT REPORTED DUE TO COLOR INTERFERENCE OF URINE PIGMENT   RBC / HPF >50 (*)    All other components within normal limits  CBC WITH DIFFERENTIAL/PLATELET - Abnormal; Notable for the following components:   RBC 3.34 (*)    Hemoglobin 10.1 (*)    HCT 32.7 (*)    Platelets <5 (*)    All other components within normal limits  BASIC METABOLIC PANEL - Abnormal; Notable for the following components:   Potassium 5.4 (*)    CO2 18 (*)    Glucose, Bld 154 (*)    BUN 34 (*)    Creatinine, Ser 1.53 (*)    Calcium 8.8 (*)    GFR calc non Af Amer 39 (*)    GFR calc Af Amer 45 (*)    All other components within normal limits  POC OCCULT BLOOD, ED - Abnormal; Notable for the following components:   Fecal Occult Bld POSITIVE (*)    All other components within normal limits  SARS CORONAVIRUS 2 (TAT 6-24 HRS)  HEPATIC FUNCTION PANEL  PROTIME-INR  APTT  TYPE AND SCREEN    EKG  None  Radiology Ct Head Wo Contrast  Result Date: 11/05/2018 CLINICAL DATA:  Thrombocytopenia EXAM: CT HEAD WITHOUT CONTRAST TECHNIQUE: Contiguous axial images were obtained from the base of the skull through the vertex without intravenous contrast. COMPARISON:  None. FINDINGS: Brain: There is no mass, hemorrhage or extra-axial collection. Left temporal lobe encephalomalacia. The brain parenchyma is normal, without acute or chronic infarction. Vascular: No abnormal hyperdensity of the major intracranial arteries or dural venous sinuses. No intracranial atherosclerosis. Skull: Remote left pterional craniotomy. Sinuses/Orbits: No fluid levels or advanced mucosal thickening of the visualized paranasal sinuses. No mastoid or middle ear effusion. The orbits are normal. IMPRESSION: Left temporal lobe encephalomalacia. No acute intracranial abnormality. Electronically Signed   By: Ulyses Jarred M.D.   On: 11/05/2018 00:12    Procedures Procedures (including critical care time)  CRITICAL CARE Performed by: Larene Pickett   Total critical care time: 45 minutes  Critical care time was exclusive of separately billable procedures and treating other patients.  Critical care was necessary to treat or prevent imminent or life-threatening deterioration.  Critical care was time spent personally by me on the following activities: development of treatment plan with patient and/or surrogate as well as nursing, discussions with consultants, evaluation of patient's response to treatment, examination of patient, obtaining history from patient or surrogate, ordering and performing treatments and interventions, ordering and review of laboratory studies, ordering and review of radiographic studies, pulse oximetry and re-evaluation of patient's condition.   Medications Ordered in ED Medications - No data to display   Initial Impression / Assessment and Plan / ED Course  I have reviewed the triage vital signs and  the nursing notes.  Pertinent labs & imaging results that were available during my care of the patient were reviewed by me and considered in my medical decision making (see chart for details).  53 year old female here with spontaneous hematuria.  Is also having some atraumatic bruising and now blood in the stool.  She has a history of chronic thrombocytopenia, normal bone marrow biopsy in 2017, possibly due to chronic ITP.  She has not had any recent follow-up with hematology in several years.  On exam she is afebrile and nontoxic.  She is awake, alert, oriented.  She is slowly losing blood from her urethral meatus, no gross blood noted on rectal exam but hemoccult is positive.  Does have some bruising of the upper extremities, particularly her left.  She is hemodynamically stable.  Labs today with platelet count <5 but hemoglobin remains around her baseline of 10.  Patient will require admission.  Add on LFt's, coags, type and screen.  She also has history of ICH so head CT ordered.  Head CT reassuring. Normal LFT's, coags.  COVID screen pending.  hospitalist consulted for admission.  Discussed with Dr. Jonelle Sidle-- he will admit for ongoing care.  Hematology was consulted but did not receive call back, hopefully can try to consult in AM.  Final Clinical Impressions(s) / ED Diagnoses   Final diagnoses:  Thrombocytopenia [facility])  Gross hematuria  Bruising    ED Discharge Orders    None       Larene Pickett, PA-C [id number]    Delora Fuel, MD 38/10/17 [phone]

## 2018-11-05 ENCOUNTER — Encounter (HOSPITAL_COMMUNITY): Payer: Self-pay

## 2018-11-05 DIAGNOSIS — D696 Thrombocytopenia, unspecified: Secondary | ICD-10-CM

## 2018-11-05 LAB — CBC
HCT: 31.4 % — ABNORMAL LOW (ref 36.0–46.0)
Hemoglobin: 10 g/dL — ABNORMAL LOW (ref 12.0–15.0)
MCH: 30.4 pg (ref 26.0–34.0)
MCHC: 31.8 g/dL (ref 30.0–36.0)
MCV: 95.4 fL (ref 80.0–100.0)
Platelets: 7 10*3/uL — CL (ref 150–400)
RBC: 3.29 MIL/uL — ABNORMAL LOW (ref 3.87–5.11)
RDW: 14.6 % (ref 11.5–15.5)
WBC: 5.5 10*3/uL (ref 4.0–10.5)
nRBC: 0 % (ref 0.0–0.2)

## 2018-11-05 LAB — TYPE AND SCREEN
ABO/RH(D): B POS
Antibody Screen: NEGATIVE

## 2018-11-05 LAB — COMPREHENSIVE METABOLIC PANEL
ALT: 34 U/L (ref 0–44)
AST: 31 U/L (ref 15–41)
Albumin: 3.8 g/dL (ref 3.5–5.0)
Alkaline Phosphatase: 69 U/L (ref 38–126)
Anion gap: 11 (ref 5–15)
BUN: 35 mg/dL — ABNORMAL HIGH (ref 6–20)
CO2: 19 mmol/L — ABNORMAL LOW (ref 22–32)
Calcium: 8.7 mg/dL — ABNORMAL LOW (ref 8.9–10.3)
Chloride: 108 mmol/L (ref 98–111)
Creatinine, Ser: 1.71 mg/dL — ABNORMAL HIGH (ref 0.44–1.00)
GFR calc Af Amer: 39 mL/min — ABNORMAL LOW (ref >60)
GFR calc non Af Amer: 34 mL/min — ABNORMAL LOW (ref >60)
Glucose, Bld: 130 mg/dL — ABNORMAL HIGH (ref 70–99)
Potassium: 5.3 mmol/L — ABNORMAL HIGH (ref 3.5–5.1)
Sodium: 138 mmol/L (ref 135–145)
Total Bilirubin: 0.6 mg/dL (ref 0.3–1.2)
Total Protein: 6.9 g/dL (ref 6.5–8.1)

## 2018-11-05 LAB — HEMOGLOBIN A1C
Hgb A1c MFr Bld: 6 % — ABNORMAL HIGH (ref 4.8–5.6)
Mean Plasma Glucose: 125.5 mg/dL

## 2018-11-05 LAB — HEPATIC FUNCTION PANEL
ALT: 36 U/L (ref 0–44)
AST: 37 U/L (ref 15–41)
Albumin: 3.8 g/dL (ref 3.5–5.0)
Alkaline Phosphatase: 68 U/L (ref 38–126)
Bilirubin, Direct: 0.1 mg/dL (ref 0.0–0.2)
Indirect Bilirubin: 0.5 mg/dL (ref 0.3–0.9)
Total Bilirubin: 0.6 mg/dL (ref 0.3–1.2)
Total Protein: 7.6 g/dL (ref 6.5–8.1)

## 2018-11-05 LAB — GLUCOSE, CAPILLARY
Glucose-Capillary: 160 mg/dL — ABNORMAL HIGH (ref 70–99)
Glucose-Capillary: 283 mg/dL — ABNORMAL HIGH (ref 70–99)
Glucose-Capillary: 407 mg/dL — ABNORMAL HIGH (ref 70–99)

## 2018-11-05 LAB — VITAMIN B12: Vitamin B-12: 75 pg/mL — ABNORMAL LOW (ref 180–914)

## 2018-11-05 LAB — HIV ANTIBODY (ROUTINE TESTING W REFLEX): HIV Screen 4th Generation wRfx: NONREACTIVE

## 2018-11-05 LAB — APTT: aPTT: 28 seconds (ref 24–36)

## 2018-11-05 LAB — PROTIME-INR
INR: 1.2 (ref 0.8–1.2)
Prothrombin Time: 14.9 seconds (ref 11.4–15.2)

## 2018-11-05 LAB — SARS CORONAVIRUS 2 (TAT 6-24 HRS): SARS Coronavirus 2: NEGATIVE

## 2018-11-05 LAB — TSH: TSH: 3.224 u[IU]/mL (ref 0.350–4.500)

## 2018-11-05 MED ORDER — INSULIN ASPART 100 UNIT/ML ~~LOC~~ SOLN
0.0000 [IU] | Freq: Every day | SUBCUTANEOUS | Status: DC
  Administered 2018-11-08: 5 [IU] via SUBCUTANEOUS
  Administered 2018-11-09: 4 [IU] via SUBCUTANEOUS
  Administered 2018-11-10: 2 [IU] via SUBCUTANEOUS

## 2018-11-05 MED ORDER — ONDANSETRON HCL 4 MG PO TABS
4.0000 mg | ORAL_TABLET | Freq: Four times a day (QID) | ORAL | Status: DC | PRN
  Administered 2018-11-10: 4 mg via ORAL
  Filled 2018-11-05: qty 1

## 2018-11-05 MED ORDER — DEXAMETHASONE 6 MG PO TABS
40.0000 mg | ORAL_TABLET | Freq: Every day | ORAL | Status: DC
  Administered 2018-11-05 – 2018-11-08 (×4): 40 mg via ORAL
  Filled 2018-11-05 (×8): qty 1

## 2018-11-05 MED ORDER — INFLUENZA VAC SPLIT QUAD 0.5 ML IM SUSY: 0.5000 mL | PREFILLED_SYRINGE | INTRAMUSCULAR | Status: DC

## 2018-11-05 MED ORDER — VITAMIN D 25 MCG (1000 UNIT) PO TABS
5000.0000 [IU] | ORAL_TABLET | Freq: Every day | ORAL | Status: DC
  Administered 2018-11-05 – 2018-11-13 (×9): 5000 [IU] via ORAL
  Filled 2018-11-05 (×9): qty 5

## 2018-11-05 MED ORDER — GABAPENTIN 400 MG PO CAPS
400.0000 mg | ORAL_CAPSULE | Freq: Three times a day (TID) | ORAL | Status: DC
  Administered 2018-11-05 – 2018-11-13 (×25): 400 mg via ORAL
  Filled 2018-11-05 (×26): qty 1

## 2018-11-05 MED ORDER — AMLODIPINE BESYLATE 5 MG PO TABS
5.0000 mg | ORAL_TABLET | Freq: Every day | ORAL | Status: DC
  Administered 2018-11-05 – 2018-11-06 (×2): 5 mg via ORAL
  Filled 2018-11-05 (×2): qty 1

## 2018-11-05 MED ORDER — PNEUMOCOCCAL VAC POLYVALENT 25 MCG/0.5ML IJ INJ: 0.5000 mL | INJECTION | INTRAMUSCULAR | Status: DC

## 2018-11-05 MED ORDER — CALCIUM CARBONATE ANTACID 500 MG PO CHEW
2.0000 | CHEWABLE_TABLET | Freq: Three times a day (TID) | ORAL | Status: DC
  Administered 2018-11-05 – 2018-11-13 (×25): 400 mg via ORAL
  Filled 2018-11-05 (×25): qty 2

## 2018-11-05 MED ORDER — INSULIN ASPART 100 UNIT/ML ~~LOC~~ SOLN
0.0000 [IU] | Freq: Three times a day (TID) | SUBCUTANEOUS | Status: DC
  Administered 2018-11-05: 8 [IU] via SUBCUTANEOUS
  Administered 2018-11-06: 15 [IU] via SUBCUTANEOUS
  Administered 2018-11-06: 11 [IU] via SUBCUTANEOUS
  Administered 2018-11-06: 15 [IU] via SUBCUTANEOUS

## 2018-11-05 MED ORDER — INSULIN ASPART 100 UNIT/ML ~~LOC~~ SOLN
7.0000 [IU] | Freq: Once | SUBCUTANEOUS | Status: AC
  Administered 2018-11-06: 7 [IU] via SUBCUTANEOUS

## 2018-11-05 MED ORDER — ONDANSETRON HCL 4 MG/2ML IJ SOLN: 4.0000 mg | Freq: Four times a day (QID) | INTRAMUSCULAR | Status: DC | PRN

## 2018-11-05 MED ORDER — SODIUM CHLORIDE 0.9 % IV SOLN
INTRAVENOUS | Status: DC
  Administered 2018-11-09 – 2018-11-12 (×5): via INTRAVENOUS

## 2018-11-05 MED ORDER — HYDRALAZINE HCL 20 MG/ML IJ SOLN
10.0000 mg | Freq: Four times a day (QID) | INTRAMUSCULAR | Status: DC | PRN
  Administered 2018-11-06 – 2018-11-07 (×3): 10 mg via INTRAVENOUS
  Filled 2018-11-05 (×4): qty 1

## 2018-11-05 MED ORDER — SODIUM CHLORIDE 0.9% IV SOLUTION: Freq: Once | INTRAVENOUS | Status: DC

## 2018-11-05 NOTE — H&P (Signed)
History and Physical   [name] NOB:[id number] DOB: 1965/07/02 DOA: 11/04/2018  Referring MD/NP/PA: Dr. Dione Booze  PCP: Patient, No Pcp Per   Outpatient Specialists: Dr. Dan Humphreys, oncology  Patient coming from: Home  Chief Complaint: Hematuria  HPI: [name] is a 53 y.o. female with medical history significant of recurrent thrombocytopenia suspected to be due to ITP, diabetes, intracranial hemorrhage, status post right BKA following diabetic ulcer, osteoarthritis, hypertension who presents today with hematuria.  Symptoms started last Friday and continued.  Mild dysuria.  This has gradually progressed to bloody stools.  Denied any dizziness.  Denied any abdominal pain.  Denied any nausea vomiting or diarrhea.  Patient was evaluated and found to have stable hemoglobin at this point but guaiac positive stool and evidence of hematuria.  Platelets is found to be only 5000.  Her last work-up and follow-up was in 2017.  It was not as severe as this episode.  She has been admitted for further work-up.  ED Course: Temperature 98.9 blood pressure 140/96 pulse 100 respiratory 27 oxygen sat 98% on room air.  White count is 4.5 hemoglobin 10.1 and platelets less than 5000.  Sodium 136 potassium 5.4 chloride 107 CO2 of 18 BUN is 34 creatinine 1.53 and calcium 8.8.  INR is 1.2 and glucose 154.  Head CT showed left temporal lobe encephalomalacia otherwise no significant findings 2 units of platelets ordered and patient being admitted for treatment  Review of Systems: As per HPI otherwise 10 point review of systems negative.    Past Medical History:  Diagnosis Date  . Anemia   . Arthritis    "spine, hands" (11/25/2015)  . Back pain    "all my back; probably 3 times/week" (11/25/2015)  . Chronic indwelling Foley catheter 03/04/2017  . Diabetic foot ulcer associated with type 2 diabetes mellitus (HCC)   . Hypertension   . Intracerebral hemorrhage (HCC) As a teenager    States she  had burst blood vessel as teenager, now with resultant minor visual field and hearing deficits  . Neuropathy   . Stroke [facility]) ~ 1982   "my feeling on my RLE, right lower eye vision,  & hearing out of my right ear not the same since" (11/25/2015)  . Type II diabetes mellitus (HCC)     Past Surgical History:  Procedure Laterality Date  . AMPUTATION  11/24/2010   Procedure: AMPUTATION FOOT;  Surgeon: [name], MD;  Location: [facility] OR;  Service: Orthopedics;  Laterality: Right;  Right  FOOT TRANS METATARSAL AMPUTATION  . AMPUTATION  07/02/2011   Procedure: AMPUTATION BELOW KNEE;  Surgeon: [name], MD;  Location: [facility] OR;  Service: Orthopedics;  Laterality: Right;  . APPLICATION OF WOUND VAC  07/02/2011   Procedure: APPLICATION OF WOUND VAC;  Surgeon: [name], MD;  Location: [facility] OR;  Service: Orthopedics;  Laterality: Right;  . BRAIN SURGERY  1980's   Previous brain surgery in [place], [place]  . CESAREAN SECTION  2004; 2006  . COLONOSCOPY N/A 09/17/2015   Procedure: COLONOSCOPY;  Surgeon: Charlott Rakes, MD;  Location: [facility] ENDOSCOPY;  Service: Endoscopy;  Laterality: N/A;  . ESOPHAGOGASTRODUODENOSCOPY N/A 09/17/2015   Procedure: ESOPHAGOGASTRODUODENOSCOPY (EGD);  Surgeon: Charlott Rakes, MD;  Location: [facility] ENDOSCOPY;  Service: Endoscopy;  Laterality: N/A;  . I&D EXTREMITY  11/24/2010   Procedure: IRRIGATION AND DEBRIDEMENT EXTREMITY;  Surgeon: [name], MD;  Location: [facility] OR;  Service: Orthopedics;  Laterality: Left;  Debriedment of left plantar ulcer and trimming of toenails  .  I&D EXTREMITY  07/02/2011   Procedure: IRRIGATION AND DEBRIDEMENT EXTREMITY;  Surgeon: Wylene Simmer, MD;  Location: [place];  Service: Orthopedics;  Laterality: Left;  I & D of Left Foot  . I&D EXTREMITY  07/06/2011   Procedure: IRRIGATION AND DEBRIDEMENT EXTREMITY;  Surgeon: Wylene Simmer, MD;  Location: [place];  Service: Orthopedics;  Laterality: Right;  IRRIGATION/DEBRIDEMENT RIGHT BELOW KNEE AMPUTATION  . TUBAL LIGATION   2006     reports that she has never smoked. She has never used smokeless tobacco. She reports current alcohol use of about 2.0 standard drinks of alcohol per week. She reports that she does not use drugs.  No Known Allergies  Family History  Problem Relation Age of Onset  . Diabetes Mother   . Hypertension Mother   . Hyperlipidemia Mother   . Diabetes Father   . Cancer Father        prostate  . Hyperlipidemia Father   . Hypertension Father   . Diabetes Brother   . Peripheral Artery Disease Brother        has had toes amputated  . Diabetes Son      Prior to Admission medications   Medication Sig Start Date End Date Taking? Authorizing Provider  gabapentin (NEURONTIN) 400 MG capsule Take 1 capsule (400 mg total) by mouth 3 (three) times daily. Take 400 mg by mouth two times a day, and an additional 400 mg as needed for nerve pain Patient taking differently: Take 400 mg by mouth 3 (three) times daily.  10/16/18  Yes Mack Hook, MD  amLODipine (NORVASC) 5 MG tablet Take 1 tablet (5 mg total) by mouth daily. Patient not taking: Reported on 11/05/2018 10/16/18   Mack Hook, MD  calcium carbonate (TUMS - DOSED IN MG ELEMENTAL CALCIUM) 500 MG chewable tablet Chew 2 tablets (400 mg of elemental calcium total) by mouth 3 (three) times daily. Patient not taking: Reported on 10/16/2018 09/18/15   Love, Ivan Anchors, PA-C  cholecalciferol 5000 units TABS Take 1 tablet (5,000 Units total) by mouth daily. Patient not taking: Reported on 10/16/2018 03/08/17   Ainsley Spinner, PA-C  metFORMIN (GLUCOPHAGE) 500 MG tablet Take 1 tablet (500 mg total) by mouth 2 (two) times daily with a meal. Patient not taking: Reported on 11/05/2018 10/16/18 10/16/19  Mack Hook, MD  terbinafine (LAMISIL) 1 % cream Apply 1 application topically 2 (two) times daily. Patient not taking: Reported on 11/05/2018 10/16/18   Mack Hook, MD    Physical Exam: Vitals:   11/04/18 2012 11/04/18 2310  11/05/18 0000 11/05/18 0015  BP: (!) 149/87 (!) 150/82 (!) 162/134 (!) 180/73  Pulse: 86 89 95 100  Resp: 18 17 (!) (P) 21 13  Temp: 98.9 F (37.2 C)     TempSrc: Oral     SpO2: 100% 100% 100% 98%      Constitutional: NAD, hard of hearing Vitals:   11/04/18 2012 11/04/18 2310 11/05/18 0000 11/05/18 0015  BP: (!) 149/87 (!) 150/82 (!) 162/134 (!) 180/73  Pulse: 86 89 95 100  Resp: 18 17 (!) (P) 21 13  Temp: 98.9 F (37.2 C)     TempSrc: Oral     SpO2: 100% 100% 100% 98%   Eyes: PERRL, lids and conjunctivae normal ENMT: Mucous membranes are moist. Posterior pharynx clear of any exudate or lesions.Normal dentition.  Neck: normal, supple, no masses, no thyromegaly Respiratory: clear to auscultation bilaterally, no wheezing, no crackles. Normal respiratory effort. No accessory muscle use.  Cardiovascular: Regular  rate and rhythm, no murmurs / rubs / gallops. No extremity edema. 2+ pedal pulses. No carotid bruits.  Abdomen: no tenderness, no masses palpated. No hepatosplenomegaly. Bowel sounds positive.  Musculoskeletal: Right BKA  skin: Multiple skin bruises Neurologic: CN 2-12 grossly intact. Sensation intact, DTR normal. Strength 5/5 in all 4.  Psychiatric: Normal judgment and insight. Alert and oriented x 3. Normal mood.     Labs on Admission: I have personally reviewed following labs and imaging studies  CBC: Recent Labs  Lab 11/02/18 0845 11/04/18 2038  WBC CANCELED 4.5  NEUTROABS  --  2.9  HGB CANCELED 10.1*  HCT CANCELED 32.7*  MCV  --  97.9  PLT CANCELED <5*   Basic Metabolic Panel: Recent Labs  Lab 11/04/18 2038  NA 136  K 5.4*  CL 107  CO2 18*  GLUCOSE 154*  BUN 34*  CREATININE 1.53*  CALCIUM 8.8*   GFR: CrCl cannot be calculated (Unknown ideal weight.). Liver Function Tests: Recent Labs  Lab 11/05/18 0001  AST 37  ALT 36  ALKPHOS 68  BILITOT 0.6  PROT 7.6  ALBUMIN 3.8   No results for input(s): LIPASE, AMYLASE in the last 168 hours.  No results for input(s): AMMONIA in the last 168 hours. Coagulation Profile: Recent Labs  Lab 11/02/18 0845 11/05/18 0001  INR WILL FOLLOW 1.2   Cardiac Enzymes: No results for input(s): CKTOTAL, CKMB, CKMBINDEX, TROPONINI in the last 168 hours. BNP (last 3 results) No results for input(s): PROBNP in the last 8760 hours. HbA1C: No results for input(s): HGBA1C in the last 72 hours. CBG: No results for input(s): GLUCAP in the last 168 hours. Lipid Profile: Recent Labs    11/02/18 0845  CHOL 160  HDL 62  LDLCALC 82  TRIG 89   Thyroid Function Tests: No results for input(s): TSH, T4TOTAL, FREET4, T3FREE, THYROIDAB in the last 72 hours. Anemia Panel: No results for input(s): VITAMINB12, FOLATE, FERRITIN, TIBC, IRON, RETICCTPCT in the last 72 hours. Urine analysis:    Component Value Date/Time   COLORURINE RED (A) 11/04/2018 2017   APPEARANCEUR TURBID (A) 11/04/2018 2017   LABSPEC  11/04/2018 2017    TEST NOT REPORTED DUE TO COLOR INTERFERENCE OF URINE PIGMENT   PHURINE  11/04/2018 2017    TEST NOT REPORTED DUE TO COLOR INTERFERENCE OF URINE PIGMENT   GLUCOSEU (A) 11/04/2018 2017    TEST NOT REPORTED DUE TO COLOR INTERFERENCE OF URINE PIGMENT   HGBUR (A) 11/04/2018 2017    TEST NOT REPORTED DUE TO COLOR INTERFERENCE OF URINE PIGMENT   BILIRUBINUR (A) 11/04/2018 2017    TEST NOT REPORTED DUE TO COLOR INTERFERENCE OF URINE PIGMENT   KETONESUR (A) 11/04/2018 2017    TEST NOT REPORTED DUE TO COLOR INTERFERENCE OF URINE PIGMENT   PROTEINUR (A) 11/04/2018 2017    TEST NOT REPORTED DUE TO COLOR INTERFERENCE OF URINE PIGMENT   UROBILINOGEN 2.0 (H) 07/08/2011 0051   NITRITE (A) 11/04/2018 2017    TEST NOT REPORTED DUE TO COLOR INTERFERENCE OF URINE PIGMENT   LEUKOCYTESUR (A) 11/04/2018 2017    TEST NOT REPORTED DUE TO COLOR INTERFERENCE OF URINE PIGMENT   Sepsis Labs: (procalcitonin:4,lacticidven:4) )No results found for this or any previous visit (from the past  240 hour(s)).   Radiological Exams on Admission: Ct Head Wo Contrast  Result Date: 11/05/2018 CLINICAL DATA:  Thrombocytopenia EXAM: CT HEAD WITHOUT CONTRAST TECHNIQUE: Contiguous axial images were obtained from the base of the skull through the vertex without  intravenous contrast. COMPARISON:  None. FINDINGS: Brain: There is no mass, hemorrhage or extra-axial collection. Left temporal lobe encephalomalacia. The brain parenchyma is normal, without acute or chronic infarction. Vascular: No abnormal hyperdensity of the major intracranial arteries or dural venous sinuses. No intracranial atherosclerosis. Skull: Remote left pterional craniotomy. Sinuses/Orbits: No fluid levels or advanced mucosal thickening of the visualized paranasal sinuses. No mastoid or middle ear effusion. The orbits are normal. IMPRESSION: Left temporal lobe encephalomalacia. No acute intracranial abnormality. Electronically Signed   By: Deatra RobinsonKevin  Herman M.D.   On: 11/05/2018 00:12      Assessment/Plan Principal Problem:   Thrombocytopenia (HCC) Active Problems:   Essential hypertension   Diabetes mellitus (HCC)   Occult blood positive stool   Benign essential HTN   CKD (chronic kidney disease) stage 3, GFR 30-59 ml/min     #1 recurrent thrombocytopenia: Suspected ITP.  We will admit the patient and transfused 2 units of platelets.  We will consult hematology in the morning.  Patient may require steroid treatment if repeat CBC does not show improvement in platelets.  May also require IVIG.  Will defer to hematology.  #2 diabetes: Sliding scale insulin.  Continue home regimen  #3 hematuria and bloody stools: Most likely due to thrombocytopenia.  We will address platelets.  #4 hypertension: Continue blood pressure  #5 chronic kidney disease stage III: At baseline.  Continue monitor  #6 hyperkalemia: Potassium slightly elevated.  Repeat potassium if is still elevated may treat with Kayexalate   DVT prophylaxis: SCD  Code Status: Full code Family Communication: No family at bedside Disposition Plan: Home Consults called: Consult hematology in the morning Admission status: Inpatient  Severity of Illness: The appropriate patient status for this patient is INPATIENT. Inpatient status is judged to be reasonable and necessary in order to provide the required intensity of service to ensure the patient's safety. The patient's presenting symptoms, physical exam findings, and initial radiographic and laboratory data in the context of their chronic comorbidities is felt to place them at high risk for further clinical deterioration. Furthermore, it is not anticipated that the patient will be medically stable for discharge from the hospital within 2 midnights of admission. The following factors support the patient status of inpatient.   " The patient's presenting symptoms include hematuria and bright red blood per rectum. " The worrisome physical exam findings include mild pallor. " The initial radiographic and laboratory data are worrisome because of platelets of less than 5000. " The chronic co-morbidities include history of ITP.   * I certify that at the point of admission it is my clinical judgment that the patient will require inpatient hospital care spanning beyond 2 midnights from the point of admission due to high intensity of service, high risk for further deterioration and high frequency of surveillance required.Lonia Blood*    Kaliel Bolds,LAWAL MD [facility] Pager [phone]  If 7PM-7AM, please contact night-coverage [email or web address] Password [email or web address]  11/05/2018, 1:33 AM

## 2018-11-05 NOTE — Progress Notes (Signed)
This is a 53 year old pleasant lady with a history of recurrent thrombocytopenia suspected to be due to ITP, type 2 diabetes mellitus, intracranial hemorrhage and other medical comorbidities was admitted early morning due to gross hematuria.  Please review H&P for details. Patient seen and examined.  She states that she continues to have dysuria.  No other complaint.  UA could not detect if there was any nitrite or leukoesterase due to pigment issues.  Wonder if her dysuria is likely secondary to mucosal irritation due to mucosal bleeding and not UTI since she does not have any fever or any leukocytosis. She came in with platelets less than 5.  She received 1 unit of platelet transfusion.  Platelet improved only 7.  I highly suspect ITP.  Started on dexamethasone 40 mg p.o. daily for 4 days.  Consulted oncology and they agreed with this plan.  I have also ordered peripheral smear, HIV, hepatitis C virus, folate and vitamin B12 levels.  Hemoglobin is stable. We will also add on SSI for diabetes mellitus.

## 2018-11-05 NOTE — Consult Note (Signed)
[place] CONSULT NOTE  Patient Care Team: Patient, No Pcp Per as PCP - General (General Practice)  CHIEF COMPLAINTS/PURPOSE OF CONSULTATION:  Severe thrombocytopenia  HISTORY OF PRESENTING ILLNESS:  [name] 53 y.o. female is admitted to the hospital with the acute worsening of thrombocytopenia.  Her platelet count was 5 and after transfusion went up to 7.  She had hematuria as a presenting symptom to the hospital.  She has a prior history of diabetes and a longstanding thrombocytopenia.  She also has neuropathy probably from diabetes.  She takes Metformin currently.  She tells me that she has discomfort when she passes urine and she has seen blood in the urine. She has fair number of bruises on her extremities especially in the left elbow area.  She is also got petechiae.  I reviewed her records extensively and collaborated the history with the patient.  SUMMARY OF ONCOLOGIC HISTORY: Oncology History   No history exists.     MEDICAL HISTORY:  Past Medical History:  Diagnosis Date  . Anemia   . Arthritis    "spine, hands" (11/25/2015)  . Back pain    "all my back; probably 3 times/week" (11/25/2015)  . Chronic indwelling Foley catheter 03/04/2017  . Diabetic foot ulcer associated with type 2 diabetes mellitus ([place])   . Hypertension   . Intracerebral hemorrhage ([place]) As a teenager    States she had burst blood vessel as teenager, now with resultant minor visual field and hearing deficits  . Neuropathy   . Stroke [facility]) ~ 1982   "my feeling on my RLE, right lower eye vision,  & hearing out of my right ear not the same since" (11/25/2015)  . Type II diabetes mellitus ([place])     SURGICAL HISTORY: Past Surgical History:  Procedure Laterality Date  . AMPUTATION  11/24/2010   Procedure: AMPUTATION FOOT;  Surgeon: Wylene Simmer, MD;  Location: [place];  Service: Orthopedics;  Laterality: Right;  Right  FOOT TRANS METATARSAL AMPUTATION  . AMPUTATION  07/02/2011   Procedure: AMPUTATION BELOW KNEE;  Surgeon: Wylene Simmer, MD;  Location: [place];  Service: Orthopedics;  Laterality: Right;  . APPLICATION OF WOUND VAC  07/02/2011   Procedure: APPLICATION OF WOUND VAC;  Surgeon: Wylene Simmer, MD;  Location: [place];  Service: Orthopedics;  Laterality: Right;  . BRAIN SURGERY  1980's   Previous brain surgery in [place], [place]  . CESAREAN SECTION  2004; 2006  . COLONOSCOPY N/A 09/17/2015   Procedure: COLONOSCOPY;  Surgeon: Wilford Corner, MD;  Location: [facility] ENDOSCOPY;  Service: Endoscopy;  Laterality: N/A;  . ESOPHAGOGASTRODUODENOSCOPY N/A 09/17/2015   Procedure: ESOPHAGOGASTRODUODENOSCOPY (EGD);  Surgeon: Wilford Corner, MD;  Location: [facility] ENDOSCOPY;  Service: Endoscopy;  Laterality: N/A;  . I&D EXTREMITY  11/24/2010   Procedure: IRRIGATION AND DEBRIDEMENT EXTREMITY;  Surgeon: Wylene Simmer, MD;  Location: [place];  Service: Orthopedics;  Laterality: Left;  Debriedment of left plantar ulcer and trimming of toenails  . I&D EXTREMITY  07/02/2011   Procedure: IRRIGATION AND DEBRIDEMENT EXTREMITY;  Surgeon: Wylene Simmer, MD;  Location: [place];  Service: Orthopedics;  Laterality: Left;  I & D of Left Foot  . I&D EXTREMITY  07/06/2011   Procedure: IRRIGATION AND DEBRIDEMENT EXTREMITY;  Surgeon: Wylene Simmer, MD;  Location: [place];  Service: Orthopedics;  Laterality: Right;  IRRIGATION/DEBRIDEMENT RIGHT BELOW KNEE AMPUTATION  . TUBAL LIGATION  2006    SOCIAL HISTORY: Social History   Socioeconomic History  . Marital status: Widowed  Spouse name: Not on file  . Number of children: Not on file  . Years of education: Not on file  . Highest education level: Not on file  Occupational History  . Not on file  Social Needs  . Financial resource strain: Not on file  . Food insecurity    Worry: Not on file    Inability: Not on file  . Transportation needs    Medical: Not on file    Non-medical: Not on file  Tobacco Use  . Smoking status: Never Smoker  . Smokeless tobacco:  Never Used  Substance and Sexual Activity  . Alcohol use: Yes    Alcohol/week: 2.0 standard drinks    Types: 2 Glasses of wine per week  . Drug use: No  . Sexual activity: Never    Birth control/protection: Post-menopausal  Lifestyle  . Physical activity    Days per week: Not on file    Minutes per session: Not on file  . Stress: Not on file  Relationships  . Social Musicianconnections    Talks on phone: Not on file    Gets together: Not on file    Attends religious service: Not on file    Active member of club or organization: Not on file    Attends meetings of clubs or organizations: Not on file    Relationship status: Not on file  . Intimate partner violence    Fear of current or ex partner: Not on file    Emotionally abused: Not on file    Physically abused: Not on file    Forced sexual activity: Not on file  Other Topics Concern  . Not on file  Social History Narrative   Widowed in 2012   Lives in a home she owns   No wheelchair ramp, though garage entry is flush to both garage and entry floor.   Unable to use full bathroom upstairs.   Has prosthesis for right LE, but never learned how to use.   Cannot see well due to cataracts.   Did not receive disability in 09/2017 with application.   Possibly due to husband's SS she receives    FAMILY HISTORY: Family History  Problem Relation Age of Onset  . Diabetes Mother   . Hypertension Mother   . Hyperlipidemia Mother   . Diabetes Father   . Cancer Father        prostate  . Hyperlipidemia Father   . Hypertension Father   . Diabetes Brother   . Peripheral Artery Disease Brother        has had toes amputated  . Diabetes Son     ALLERGIES:  has No Known Allergies.  MEDICATIONS:  Current Facility-Administered Medications  Medication Dose Route Frequency Provider Last Rate Last Dose  . 0.9 %  sodium chloride infusion (Manually program via Guardrails IV Fluids)   Intravenous Once Garba, Mohammad L, MD      . 0.9 %  sodium  chloride infusion   Intravenous Continuous Mikeal HawthorneGarba, Mohammad L, MD      . dexamethasone (DECADRON) tablet 40 mg  40 mg Oral Daily Pahwani, Daleen Boavi, MD      . Melene Muller[START ON 11/06/2018] influenza vac split quadrivalent PF (FLUARIX) injection 0.5 mL  0.5 mL Intramuscular Tomorrow-1000 Garba, Mohammad L, MD      . insulin aspart (novoLOG) injection 0-15 Units  0-15 Units Subcutaneous TID WC Pahwani, Ravi, MD      . insulin aspart (novoLOG) injection 0-5 Units  0-5  Units Subcutaneous QHS Hughie Closs, MD      . ondansetron (ZOFRAN) tablet 4 mg  4 mg Oral Q6H PRN Rometta Emery, MD       Or  . ondansetron (ZOFRAN) injection 4 mg  4 mg Intravenous Q6H PRN Rometta Emery, MD      . Melene Muller ON 11/06/2018] pneumococcal 23 valent vaccine (PNU-IMMUNE) injection 0.5 mL  0.5 mL Intramuscular Tomorrow-1000 Rometta Emery, MD        REVIEW OF SYSTEMS:   Constitutional: Denies fevers, chills or abnormal night sweats Eyes: Denies blurriness of vision, double vision or watery eyes Ears, nose, mouth, throat, and face: Denies mucositis or sore throat Respiratory: Denies cough, dyspnea or wheezes Cardiovascular: Denies palpitation, chest discomfort or lower extremity swelling Gastrointestinal:  Denies nausea, heartburn or change in bowel habits Skin: Bruises Lymphatics: Denies new lymphadenopathy or easy bruising Neurological:Denies numbness, tingling or new weaknesses Behavioral/Psych: Mood is stable, no new changes   All other systems were reviewed with the patient and are negative.  PHYSICAL EXAMINATION: ECOG PERFORMANCE STATUS: 1 - Symptomatic but completely ambulatory  Vitals:   11/05/18 0456 11/05/18 0830  BP: (!) 157/77 137/64  Pulse: 95 93  Resp: 16 18  Temp: 98.7 F (37.1 C) 99.1 F (37.3 C)  SpO2: 99% 96%   Filed Weights   11/05/18 0237  Weight: 208 lb (94.3 kg)    GENERAL:alert, no distress and comfortable SKIN: Multiple bruises and petechiae EYES: normal, conjunctiva are pink and  non-injected, sclera clear OROPHARYNX:no exudate, no erythema and lips, buccal mucosa, and tongue normal  NECK: supple, thyroid normal size, non-tender, without nodularity LYMPH:  no palpable lymphadenopathy in the cervical, axillary or inguinal LUNGS: clear to auscultation and percussion with normal breathing effort HEART: regular rate & rhythm and no murmurs and no lower extremity edema ABDOMEN:abdomen soft, non-tender and normal bowel sounds Musculoskeletal:no cyanosis of digits and no clubbing  PSYCH: alert & oriented x 3 with fluent speech NEURO: no focal motor/sensory deficits  LABORATORY DATA:  I have reviewed the data as listed Lab Results  Component Value Date   WBC 5.5 11/05/2018   HGB 10.0 (L) 11/05/2018   HCT 31.4 (L) 11/05/2018   MCV 95.4 11/05/2018   PLT 7 (LL) 11/05/2018   Lab Results  Component Value Date   NA 138 11/05/2018   K 5.3 (H) 11/05/2018   CL 108 11/05/2018   CO2 19 (L) 11/05/2018    RADIOGRAPHIC STUDIES: I have personally reviewed the radiological reports and agreed with the findings in the report.  ASSESSMENT AND PLAN:  1.  Acute exacerbation of chronic ITP: We will start her on dexamethasone 40 mg x 4 days.  If she cannot tolerate dexamethasone or if the platelets do not recover then we can consider adding IVIG. Platelet transfusion would not be effective in her situation. Unless there is severe bleeding hold off on platelet transfusions. 2.  Hemoglobin appears to be stable and can be monitored. 3.  Diabetes mellitus: I discussed with hospitalist team to consider insulin sliding scale given the fact that the dexamethasone will definitely increase her blood sugars.  I briefly discussed with the patient the pathogenesis of ITP and the risk of relapse in some situations. Dr. Truett Perna will follow the patient from tomorrow onwards.   All questions were answered. The patient knows to call the clinic with any problems, questions or concerns.     Tamsen Meek, MD

## 2018-11-05 NOTE — Plan of Care (Signed)

## 2018-11-06 LAB — SPECIMEN STATUS REPORT

## 2018-11-06 LAB — BPAM PLATELET PHERESIS
Blood Product Expiration Date: 202011012359
Blood Product Expiration Date: 202011012359
ISSUE DATE / TIME: 202011010258
ISSUE DATE / TIME: 202011011628
Unit Type and Rh: 6200
Unit Type and Rh: 9500

## 2018-11-06 LAB — HCV AB W REFLEX TO QUANT PCR: HCV Ab: 0.1 s/co ratio (ref 0.0–0.9)

## 2018-11-06 LAB — CBC WITH DIFFERENTIAL/PLATELET
Abs Immature Granulocytes: 0.03 10*3/uL (ref 0.00–0.07)
Basophils Absolute: 0 10*3/uL (ref 0.0–0.1)
Basophils Relative: 0 %
Eosinophils Absolute: 0 10*3/uL (ref 0.0–0.5)
Eosinophils Relative: 0 %
HCT: 30 % — ABNORMAL LOW (ref 36.0–46.0)
Hemoglobin: 9.6 g/dL — ABNORMAL LOW (ref 12.0–15.0)
Immature Granulocytes: 1 %
Lymphocytes Relative: 12 %
Lymphs Abs: 0.6 10*3/uL — ABNORMAL LOW (ref 0.7–4.0)
MCH: 30 pg (ref 26.0–34.0)
MCHC: 32 g/dL (ref 30.0–36.0)
MCV: 93.8 fL (ref 80.0–100.0)
Monocytes Absolute: 0.1 10*3/uL (ref 0.1–1.0)
Monocytes Relative: 1 %
Neutro Abs: 4 10*3/uL (ref 1.7–7.7)
Neutrophils Relative %: 86 %
Platelets: 8 10*3/uL — CL (ref 150–400)
RBC: 3.2 MIL/uL — ABNORMAL LOW (ref 3.87–5.11)
RDW: 14 % (ref 11.5–15.5)
WBC: 4.6 10*3/uL (ref 4.0–10.5)
nRBC: 0 % (ref 0.0–0.2)

## 2018-11-06 LAB — BASIC METABOLIC PANEL
Anion gap: 12 (ref 5–15)
BUN: 39 mg/dL — ABNORMAL HIGH (ref 6–20)
CO2: 19 mmol/L — ABNORMAL LOW (ref 22–32)
Calcium: 9.2 mg/dL (ref 8.9–10.3)
Chloride: 104 mmol/L (ref 98–111)
Creatinine, Ser: 1.83 mg/dL — ABNORMAL HIGH (ref 0.44–1.00)
GFR calc Af Amer: 36 mL/min — ABNORMAL LOW (ref >60)
GFR calc non Af Amer: 31 mL/min — ABNORMAL LOW (ref >60)
Glucose, Bld: 369 mg/dL — ABNORMAL HIGH (ref 70–99)
Potassium: 4.7 mmol/L (ref 3.5–5.1)
Sodium: 135 mmol/L (ref 135–145)

## 2018-11-06 LAB — GLUCOSE, CAPILLARY
Glucose-Capillary: 351 mg/dL — ABNORMAL HIGH (ref 70–99)
Glucose-Capillary: 340 mg/dL — ABNORMAL HIGH (ref 70–99)
Glucose-Capillary: 409 mg/dL — ABNORMAL HIGH (ref 70–99)
Glucose-Capillary: 418 mg/dL — ABNORMAL HIGH (ref 70–99)

## 2018-11-06 LAB — LIPID PANEL W/O CHOL/HDL RATIO
Cholesterol, Total: 160 mg/dL (ref 100–199)
HDL: 62 mg/dL (ref >39)
LDL Chol Calc (NIH): 82 mg/dL (ref 0–99)
Triglycerides: 89 mg/dL (ref 0–149)
VLDL Cholesterol Cal: 16 mg/dL (ref 5–40)

## 2018-11-06 LAB — PREPARE PLATELET PHERESIS
Unit division: 0
Unit division: 0

## 2018-11-06 LAB — HCV INTERPRETATION

## 2018-11-06 LAB — FOLATE RBC
Folate, Hemolysate: 186 ng/mL
Folate, RBC: 705 ng/mL (ref >498)
Hematocrit: 26.4 % — ABNORMAL LOW (ref 34.0–46.6)

## 2018-11-06 LAB — PATHOLOGIST SMEAR REVIEW

## 2018-11-06 LAB — GLUCOSE, RANDOM: Glucose, Bld: 449 mg/dL — ABNORMAL HIGH (ref 70–99)

## 2018-11-06 MED ORDER — INSULIN ASPART 100 UNIT/ML ~~LOC~~ SOLN
10.0000 [IU] | Freq: Once | SUBCUTANEOUS | Status: AC
  Administered 2018-11-06: 10 [IU] via SUBCUTANEOUS

## 2018-11-06 MED ORDER — ACETAMINOPHEN 325 MG PO TABS
650.0000 mg | ORAL_TABLET | Freq: Once | ORAL | Status: DC
  Filled 2018-11-06 (×3): qty 2

## 2018-11-06 MED ORDER — CYANOCOBALAMIN 1000 MCG/ML IJ SOLN
1000.0000 ug | Freq: Once | INTRAMUSCULAR | Status: AC
  Administered 2018-11-06: 1000 ug via INTRAMUSCULAR
  Filled 2018-11-06: qty 1

## 2018-11-06 MED ORDER — PNEUMOCOCCAL VAC POLYVALENT 25 MCG/0.5ML IJ INJ: 0.5000 mL | INJECTION | INTRAMUSCULAR | Status: AC | PRN

## 2018-11-06 MED ORDER — IMMUNE GLOBULIN (HUMAN) 10 GM/100ML IV SOLN: 1.0000 g/kg | INTRAVENOUS | Status: DC

## 2018-11-06 MED ORDER — INFLUENZA VAC SPLIT QUAD 0.5 ML IM SUSY: 0.5000 mL | PREFILLED_SYRINGE | INTRAMUSCULAR | Status: DC | PRN

## 2018-11-06 MED ORDER — INSULIN GLARGINE 100 UNIT/ML ~~LOC~~ SOLN
15.0000 [IU] | Freq: Every day | SUBCUTANEOUS | Status: DC
  Administered 2018-11-06: 15 [IU] via SUBCUTANEOUS
  Filled 2018-11-06 (×2): qty 0.15

## 2018-11-06 MED ORDER — ACETAMINOPHEN 325 MG PO TABS
650.0000 mg | ORAL_TABLET | Freq: Four times a day (QID) | ORAL | Status: DC | PRN
  Administered 2018-11-06 – 2018-11-08 (×2): 650 mg via ORAL
  Filled 2018-11-06 (×7): qty 2

## 2018-11-06 MED ORDER — INSULIN ASPART 100 UNIT/ML ~~LOC~~ SOLN
0.0000 [IU] | Freq: Three times a day (TID) | SUBCUTANEOUS | Status: DC
  Administered 2018-11-07: 20 [IU] via SUBCUTANEOUS
  Administered 2018-11-07: 15 [IU] via SUBCUTANEOUS
  Administered 2018-11-07 – 2018-11-08 (×2): 20 [IU] via SUBCUTANEOUS
  Administered 2018-11-08: 7 [IU] via SUBCUTANEOUS
  Administered 2018-11-08: 11 [IU] via SUBCUTANEOUS
  Administered 2018-11-09: 20 [IU] via SUBCUTANEOUS
  Administered 2018-11-09: 7 [IU] via SUBCUTANEOUS
  Administered 2018-11-09: 20 [IU] via SUBCUTANEOUS
  Administered 2018-11-10: 2 [IU] via SUBCUTANEOUS
  Administered 2018-11-10 (×2): 3 [IU] via SUBCUTANEOUS
  Administered 2018-11-11: 4 [IU] via SUBCUTANEOUS
  Administered 2018-11-12 – 2018-11-13 (×2): 3 [IU] via SUBCUTANEOUS
  Administered 2018-11-13: 4 [IU] via SUBCUTANEOUS

## 2018-11-06 MED ORDER — IMMUNE GLOBULIN (HUMAN) 10 GM/100ML IV SOLN
1.0000 g/kg | INTRAVENOUS | Status: AC
  Administered 2018-11-07 – 2018-11-08 (×2): 105 g via INTRAVENOUS
  Filled 2018-11-06: qty 800
  Filled 2018-11-06: qty 1000

## 2018-11-06 MED ORDER — DIPHENHYDRAMINE HCL 25 MG PO CAPS: 25.0000 mg | ORAL_CAPSULE | Freq: Once | ORAL | Status: DC

## 2018-11-06 NOTE — Telephone Encounter (Signed)
Done

## 2018-11-06 NOTE — Progress Notes (Signed)
Lab called for plt count 8, no active bleeding, trace pink tinged urine noted, paged dr Deretha Emory

## 2018-11-06 NOTE — Progress Notes (Addendum)
PROGRESS NOTE    Cheryl HansenMichelle L Oh  WUX:[id number] DOB: 01/22/1965 DOA: 11/04/2018 PCP: Patient, No Pcp Per   Brief Narrative:  [name] is a 53 y.o. female with medical history significant of recurrent thrombocytopenia suspected to be due to ITP, diabetes, intracranial hemorrhage, status post right BKA following diabetic ulcer, osteoarthritis, hypertension who presented to ED on 11/04/2018 with complaint of hematuria which started last Friday and continued.  Mild dysuria.  This has gradually progressed to bloody stools.    No other complaint.  Patient was evaluated and found to have stable hemoglobin at this point but guaiac positive stool and evidence of hematuria.  Platelets is found to be only 5. Her last work-up and follow-up was in 2017.  It was not as severe as this episode.  She was otherwise hemodynamically stable upon arrival to the emergency department.  Head CT showed left temporal lobe encephalomalacia but otherwise no significant findings. She has been admitted for further work-up.   Assessment & Plan:   Principal Problem:   Thrombocytopenia (HCC) Active Problems:   Essential hypertension   Diabetes mellitus (HCC)   Occult blood positive stool   Benign essential HTN   CKD (chronic kidney disease) stage 3, GFR 30-59 ml/min  Hematuria/guaiac positive/thrombocytopenia/?  ITP: Patient received 1 unit of platelets at the time of admission.  Her platelet only improved to 7.  She was started on dexamethasone 40 mg p.o. daily for 4 days on 11/05/2018 and oncology was consulted.  Despite of this, her platelets are only 8 today.  She might need additional IVIG as oncology has documented in his note yesterday however we will defer that decision to oncology.  Urine is now more pinkish than red.  Type 2 diabetes mellitus: Blood sugar significantly elevated secondary to being on dexamethasone.  Will start on Lantus 15 units and continue SSI.  Essential hypertension: Blood pressure  was very much controlled yesterday but has remained slightly elevated since last evening.  She is on 5 mg of amlodipine which I will continue and I will also continue as needed hydralazine.  CKD stage IIIB: At baseline.  Continue to watch.  Chronic anemia: Her hemoglobin is at her baseline around 9.  MCV borderline high.  Wondering if her anemia is likely combination of macrocytic anemia and anemia of chronic disease.  Recheck in the morning.  Severe vitamin B12 deficiency: We will start with IM vitamin B12 injection.  Hyperkalemia: Resolved.  DVT prophylaxis: SCD  code Status: Full code Family Communication:  None present at bedside.  Plan of care discussed with patient in length and he verbalized understanding and agreed with it. Disposition Plan: TBD  Estimated body mass index is 36.54 kg/m as calculated from the following:   Height as of this encounter: 5\' 6"  (1.676 m).   Weight as of this encounter: 102.7 kg.  Pressure Ulcer 08/24/15 Unstageable - Full thickness tissue loss in which the base of the ulcer is covered by slough (yellow, tan, gray, green or brown) and/or eschar (tan, brown or black) in the wound bed. this is NOT a pressure injury, it is a full thi (Active)  08/24/15 0045  Location: Buttocks  Location Orientation: Left  Staging: Unstageable - Full thickness tissue loss in which the base of the ulcer is covered by slough (yellow, tan, gray, green or brown) and/or eschar (tan, brown or black) in the wound bed.  Wound Description (Comments): this is NOT a pressure injury, it is a full thickness wound  Present on Admission: Yes     Nutritional status:               Consultants:   Heme/oncology  Procedures:   None  Antimicrobials:   None   Subjective: Seen and examined.  Continues to complain of burning urination.  Urine is now pink.  Once again, she asked several questions regarding her thrombocytopenia.  I had a long discussion with her where I  explained our suspicion of ITP and what this is and how this can be treated.  She remembers only part of our discussion yesterday but not much.  I once again explained everything in detail to her today.  Objective: Vitals:   11/06/18 0514 11/06/18 0540 11/06/18 0643 11/06/18 0833  BP:  (!) 175/117 (!) 168/73 (!) 166/85  Pulse:  91 92 94  Resp:    18  Temp:    97.9 F (36.6 C)  TempSrc:    Oral  SpO2:    97%  Weight: 102.7 kg     Height:        Intake/Output Summary (Last 24 hours) at 11/06/2018 1124 Last data filed at 11/06/2018 1100 Gross per 24 hour  Intake 1793.33 ml  Output 3150 ml  Net -1356.67 ml   Filed Weights   11/05/18 0237 11/06/18 0514  Weight: 94.3 kg 102.7 kg    Examination:  General exam: Appears calm and comfortable  Respiratory system: Clear to auscultation. Respiratory effort normal. Cardiovascular system: S1 & S2 heard, RRR. No JVD, murmurs, rubs, gallops or clicks. No pedal edema. Gastrointestinal system: Abdomen is nondistended, soft and nontender. No organomegaly or masses felt. Normal bowel sounds heard. Central nervous system: Alert and oriented. No focal neurological deficits. Extremities: Symmetric 5 x 5 power. Skin: No rashes, lesions or ulcers Psychiatry: Judgement and insight appear poor, mood & affect appropriate.    Data Reviewed: I have personally reviewed following labs and imaging studies  CBC: Recent Labs  Lab 11/02/18 0845 11/04/18 2038 11/05/18 0624 11/06/18 0633  WBC CANCELED 4.5 5.5 4.6  NEUTROABS  --  2.9  --  4.0  HGB CANCELED 10.1* 10.0* 9.6*  HCT CANCELED 32.7* 31.4* 30.0*  MCV  --  97.9 95.4 93.8  PLT CANCELED <5* 7* 8*   Basic Metabolic Panel: Recent Labs  Lab 11/04/18 2038 11/05/18 0624 11/06/18 0021 11/06/18 0633  NA 136 138  --  135  K 5.4* 5.3*  --  4.7  CL 107 108  --  104  CO2 18* 19*  --  19*  GLUCOSE 154* 130* 449* 369*  BUN 34* 35*  --  39*  CREATININE 1.53* 1.71*  --  1.83*  CALCIUM 8.8* 8.7*   --  9.2   GFR: Estimated Creatinine Clearance: 44 mL/min (A) (by C-G formula based on SCr of 1.83 mg/dL (H)). Liver Function Tests: Recent Labs  Lab 11/05/18 0001 11/05/18 0624  AST 37 31  ALT 36 34  ALKPHOS 68 69  BILITOT 0.6 0.6  PROT 7.6 6.9  ALBUMIN 3.8 3.8   No results for input(s): LIPASE, AMYLASE in the last 168 hours. No results for input(s): AMMONIA in the last 168 hours. Coagulation Profile: Recent Labs  Lab 11/02/18 0845 11/05/18 0001  INR WILL FOLLOW 1.2   Cardiac Enzymes: No results for input(s): CKTOTAL, CKMB, CKMBINDEX, TROPONINI in the last 168 hours. BNP (last 3 results) No results for input(s): PROBNP in the last 8760 hours. HbA1C: Recent Labs    11/05/18 1118  HGBA1C 6.0*   CBG: Recent Labs  Lab 11/05/18 1232 11/05/18 1700 11/05/18 2338 11/06/18 0702  GLUCAP 160* 283* 407* 351*   Lipid Profile: No results for input(s): CHOL, HDL, LDLCALC, TRIG, CHOLHDL, LDLDIRECT in the last 72 hours. Thyroid Function Tests: Recent Labs    11/05/18 1118  TSH 3.224   Anemia Panel: Recent Labs    11/05/18 1118  VITAMINB12 75*   Sepsis Labs: No results for input(s): PROCALCITON, LATICACIDVEN in the last 168 hours.  Recent Results (from the past 240 hour(s))  SARS CORONAVIRUS 2 (TAT 6-24 HRS) Nasopharyngeal Nasopharyngeal Swab     Status: None   Collection Time: 11/05/18 12:07 AM   Specimen: Nasopharyngeal Swab  Result Value Ref Range Status   SARS Coronavirus 2 NEGATIVE NEGATIVE Final    Comment: (NOTE) SARS-CoV-2 target nucleic acids are NOT DETECTED. The SARS-CoV-2 RNA is generally detectable in upper and lower respiratory specimens during the acute phase of infection. Negative results do not preclude SARS-CoV-2 infection, do not rule out co-infections with other pathogens, and should not be used as the sole basis for treatment or other patient management decisions. Negative results must be combined with clinical observations, patient  history, and epidemiological information. The expected result is Negative. Fact Sheet for Patients: HairSlick.no Fact Sheet for Healthcare Providers: quierodirigir.com This test is not yet approved or cleared by the [place] FDA and  has been authorized for detection and/or diagnosis of SARS-CoV-2 by FDA under an Emergency Use Authorization (EUA). This EUA will remain  in effect (meaning this test can be used) for the duration of the COVID-19 declaration under Section 56 4(b)(1) of the Act, 21 U.S.C. section 360bbb-3(b)(1), unless the authorization is terminated or revoked sooner. Performed at [facility], [address] [place], [place] [address]       Radiology Studies: Ct Head Wo Contrast  Result Date: 11/05/2018 CLINICAL DATA:  Thrombocytopenia EXAM: CT HEAD WITHOUT CONTRAST TECHNIQUE: Contiguous axial images were obtained from the base of the skull through the vertex without intravenous contrast. COMPARISON:  None. FINDINGS: Brain: There is no mass, hemorrhage or extra-axial collection. Left temporal lobe encephalomalacia. The brain parenchyma is normal, without acute or chronic infarction. Vascular: No abnormal hyperdensity of the major intracranial arteries or dural venous sinuses. No intracranial atherosclerosis. Skull: Remote left pterional craniotomy. Sinuses/Orbits: No fluid levels or advanced mucosal thickening of the visualized paranasal sinuses. No mastoid or middle ear effusion. The orbits are normal. IMPRESSION: Left temporal lobe encephalomalacia. No acute intracranial abnormality. Electronically Signed   By: Deatra Robinson M.D.   On: 11/05/2018 00:12    Scheduled Meds: . sodium chloride   Intravenous Once  . amLODipine  5 mg Oral Daily  . calcium carbonate  2 tablet Oral TID  . cholecalciferol  5,000 Units Oral Daily  . dexamethasone  40 mg Oral Daily  . gabapentin  400 mg Oral TID  . influenza vac  split quadrivalent PF  0.5 mL Intramuscular Tomorrow-1000  . insulin aspart  0-15 Units Subcutaneous TID WC  . insulin aspart  0-5 Units Subcutaneous QHS  . pneumococcal 23 valent vaccine  0.5 mL Intramuscular Tomorrow-1000   Continuous Infusions: . sodium chloride       LOS: 1 day   Time spent: 28 minutes   Hughie Closs, MD [facility]  11/06/2018, 11:24 AM   To contact the attending provider between 7A-7P or the covering provider during after hours 7P-7A, please log into the web site [email or web address] and  use password [email or web address].

## 2018-11-06 NOTE — Progress Notes (Addendum)
HEMATOLOGY-ONCOLOGY PROGRESS NOTE  SUBJECTIVE: Started Dexamethasone 40 mg daily on 11/05/2018. Reported pink-tinged urine this am, but now clearing up. Has not noticed any other bleeding today. Reports a headache today. No vision changes. No abdominal pain, nausea, vomitnig.   REVIEW OF SYSTEMS:   Constitutional: Denies fevers, chills Respiratory: Denies cough, dyspnea or wheezes Cardiovascular: Denies palpitation, chest discomfort Gastrointestinal:  Denies nausea, heartburn or change in bowel habits Skin: Reports bruising and petechiae Lymphatics: Denies new lymphadenopathy  Neurological: Reports headache today. Denies numbness, tingling or new weaknesses Behavioral/Psych: Mood is stable, no new changes  All other systems were reviewed with the patient and are negative.  I have reviewed the past medical history, past surgical history, social history and family history with the patient and they are unchanged from previous note.   PHYSICAL EXAMINATION: ECOG PERFORMANCE STATUS: 1 - Symptomatic but completely ambulatory  Vitals:   11/06/18 0833 11/06/18 1218  BP: (!) 166/85 (!) 177/81  Pulse: 94 91  Resp: 18 20  Temp: 97.9 F (36.6 C) 98 F (36.7 C)  SpO2: 97% 100%   Filed Weights   11/05/18 0237 11/06/18 0514  Weight: 208 lb (94.3 kg) 226 lb 6.6 oz (102.7 kg)    Intake/Output from previous day: 11/01 0701 - 11/02 0700 In: 1433.3 [P.O.:1260; Blood:173.3] Out: 3100 [Urine:3100]  GENERAL:alert, no distress and comfortable SKIN: Multiple bruises and petechiae noted EYES: normal, Conjunctiva are pink and non-injected, sclera clear OROPHARYNX:no exudate, no erythema and lips, buccal mucosa, and tongue normal  NECK: supple, thyroid normal size, non-tender, without nodularity LYMPH:  no palpable lymphadenopathy in the cervical, axillary or inguinal LUNGS: clear to auscultation and percussion with normal breathing effort HEART: regular rate & rhythm and no murmurs and no lower  extremity edema ABDOMEN:abdomen soft, non-tender and normal bowel sounds Musculoskeletal:no cyanosis of digits and no clubbing, S/P right BKA  NEURO: alert & oriented x 3 with fluent speech, no focal motor/sensory deficits  LABORATORY DATA:  I have reviewed the data as listed CMP Latest Ref Rng & Units 11/06/2018 11/06/2018 11/05/2018  Glucose 70 - 99 mg/dL 369(H) 449(H) 130(H)  BUN 6 - 20 mg/dL 39(H) - 35(H)  Creatinine 0.44 - 1.00 mg/dL 1.83(H) - 1.71(H)  Sodium 135 - 145 mmol/L 135 - 138  Potassium 3.5 - 5.1 mmol/L 4.7 - 5.3(H)  Chloride 98 - 111 mmol/L 104 - 108  CO2 22 - 32 mmol/L 19(L) - 19(L)  Calcium 8.9 - 10.3 mg/dL 9.2 - 8.7(L)  Total Protein 6.5 - 8.1 g/dL - - 6.9  Total Bilirubin 0.3 - 1.2 mg/dL - - 0.6  Alkaline Phos 38 - 126 U/L - - 69  AST 15 - 41 U/L - - 31  ALT 0 - 44 U/L - - 34    Lab Results  Component Value Date   WBC 4.6 11/06/2018   HGB 9.6 (L) 11/06/2018   HCT 30.0 (L) 11/06/2018   MCV 93.8 11/06/2018   PLT 8 (LL) 11/06/2018   NEUTROABS 4.0 11/06/2018    Ct Head Wo Contrast  Result Date: 11/05/2018 CLINICAL DATA:  Thrombocytopenia EXAM: CT HEAD WITHOUT CONTRAST TECHNIQUE: Contiguous axial images were obtained from the base of the skull through the vertex without intravenous contrast. COMPARISON:  None. FINDINGS: Brain: There is no mass, hemorrhage or extra-axial collection. Left temporal lobe encephalomalacia. The brain parenchyma is normal, without acute or chronic infarction. Vascular: No abnormal hyperdensity of the major intracranial arteries or dural venous sinuses. No intracranial atherosclerosis. Skull: Remote left  pterional craniotomy. Sinuses/Orbits: No fluid levels or advanced mucosal thickening of the visualized paranasal sinuses. No mastoid or middle ear effusion. The orbits are normal. IMPRESSION: Left temporal lobe encephalomalacia. No acute intracranial abnormality. Electronically Signed   By: Deatra Robinson M.D.   On: 11/05/2018 00:12     ASSESSMENT AND PLAN: 1. Acute exacerbation of ITP 2. Anemia 3. Diabetes mellitus 4. Stage III CKD 5. HTN  -Currently on Dexamethasone 40 mg daily X 4 days. Today is day 2 of 4. Had some light blood in her urine this am which has now cleared. Platelets 8,000 this am. Continue Dexamethasone 4 mg daily. Transfuse platelets for active bleeding only. If platelets do not recover in the next few days, we ca consider adding IVIG.  -Hemoglobin is stable. No transfusion indicated today. Monitor.  -Glucose running higher due to dexamethasone. Continue insulin.    LOS: 1 day   Clenton Pare, DNP, AGPCNP-BC, AOCNP 11/06/18  Hematology: Attending Note  I personally saw the patient, reviewed the chart and examined the patient. I agree with the assessment and plan as documented above.  1.  We will add IVIG to her current treatment for 11/3 and 11/08/2018 2.  Continue to monitor her platelet counts without transfusion unless there is active bleeding. We will follow along closely.

## 2018-11-06 NOTE — Progress Notes (Signed)
Pt has IVIG ordered and still needing verfication, called pharmacy and was told note states 11/3 and 11/4, message sent to Dr Lindi Adie to clarify when to start IVIG.

## 2018-11-07 LAB — CBC WITH DIFFERENTIAL/PLATELET
Abs Immature Granulocytes: 0.03 10*3/uL (ref 0.00–0.07)
Basophils Absolute: 0 10*3/uL (ref 0.0–0.1)
Basophils Relative: 0 %
Eosinophils Absolute: 0 10*3/uL (ref 0.0–0.5)
Eosinophils Relative: 0 %
HCT: 31 % — ABNORMAL LOW (ref 36.0–46.0)
Hemoglobin: 10.1 g/dL — ABNORMAL LOW (ref 12.0–15.0)
Immature Granulocytes: 1 %
Lymphocytes Relative: 10 %
Lymphs Abs: 0.6 10*3/uL — ABNORMAL LOW (ref 0.7–4.0)
MCH: 30.8 pg (ref 26.0–34.0)
MCHC: 32.6 g/dL (ref 30.0–36.0)
MCV: 94.5 fL (ref 80.0–100.0)
Monocytes Absolute: 0.2 10*3/uL (ref 0.1–1.0)
Monocytes Relative: 4 %
Neutro Abs: 5.3 10*3/uL (ref 1.7–7.7)
Neutrophils Relative %: 85 %
Platelets: 29 10*3/uL — CL (ref 150–400)
RBC: 3.28 MIL/uL — ABNORMAL LOW (ref 3.87–5.11)
RDW: 14.4 % (ref 11.5–15.5)
WBC: 6.2 10*3/uL (ref 4.0–10.5)
nRBC: 0 % (ref 0.0–0.2)

## 2018-11-07 LAB — BASIC METABOLIC PANEL
Anion gap: 11 (ref 5–15)
BUN: 43 mg/dL — ABNORMAL HIGH (ref 6–20)
CO2: 19 mmol/L — ABNORMAL LOW (ref 22–32)
Calcium: 9.5 mg/dL (ref 8.9–10.3)
Chloride: 103 mmol/L (ref 98–111)
Creatinine, Ser: 1.63 mg/dL — ABNORMAL HIGH (ref 0.44–1.00)
GFR calc Af Amer: 42 mL/min — ABNORMAL LOW (ref >60)
GFR calc non Af Amer: 36 mL/min — ABNORMAL LOW (ref >60)
Glucose, Bld: 369 mg/dL — ABNORMAL HIGH (ref 70–99)
Potassium: 4.7 mmol/L (ref 3.5–5.1)
Sodium: 133 mmol/L — ABNORMAL LOW (ref 135–145)

## 2018-11-07 LAB — GLUCOSE, CAPILLARY
Glucose-Capillary: 326 mg/dL — ABNORMAL HIGH (ref 70–99)
Glucose-Capillary: 391 mg/dL — ABNORMAL HIGH (ref 70–99)
Glucose-Capillary: 387 mg/dL — ABNORMAL HIGH (ref 70–99)
Glucose-Capillary: 299 mg/dL — ABNORMAL HIGH (ref 70–99)

## 2018-11-07 LAB — IRON AND TIBC
Iron: 48 ug/dL (ref 28–170)
Saturation Ratios: 14 % (ref 10.4–31.8)
TIBC: 337 ug/dL (ref 250–450)
UIBC: 289 ug/dL

## 2018-11-07 LAB — RETICULOCYTES
Immature Retic Fract: 14.9 % (ref 2.3–15.9)
RBC.: 3.41 MIL/uL — ABNORMAL LOW (ref 3.87–5.11)
Retic Count, Absolute: 91.7 10*3/uL (ref 19.0–186.0)
Retic Ct Pct: 2.7 % (ref 0.4–3.1)

## 2018-11-07 LAB — FERRITIN: Ferritin: 90 ng/mL (ref 11–307)

## 2018-11-07 LAB — TRANSFERRIN: Transferrin: 241 mg/dL (ref 192–382)

## 2018-11-07 MED ORDER — SODIUM CHLORIDE 0.9% FLUSH: 10.0000 mL | INTRAVENOUS | Status: DC | PRN

## 2018-11-07 MED ORDER — AMLODIPINE BESYLATE 10 MG PO TABS
10.0000 mg | ORAL_TABLET | Freq: Every day | ORAL | Status: DC
  Administered 2018-11-07 – 2018-11-13 (×7): 10 mg via ORAL
  Filled 2018-11-07 (×7): qty 1

## 2018-11-07 MED ORDER — INSULIN GLARGINE 100 UNIT/ML ~~LOC~~ SOLN
25.0000 [IU] | Freq: Every day | SUBCUTANEOUS | Status: DC
  Administered 2018-11-07 – 2018-11-09 (×3): 25 [IU] via SUBCUTANEOUS
  Filled 2018-11-07 (×3): qty 0.25

## 2018-11-07 MED ORDER — ACETAMINOPHEN 325 MG PO TABS
650.0000 mg | ORAL_TABLET | Freq: Once | ORAL | Status: AC
  Administered 2018-11-07: 650 mg via ORAL
  Filled 2018-11-07: qty 2

## 2018-11-07 MED ORDER — DIPHENHYDRAMINE HCL 25 MG PO CAPS
25.0000 mg | ORAL_CAPSULE | Freq: Once | ORAL | Status: AC
  Administered 2018-11-07: 25 mg via ORAL
  Filled 2018-11-07: qty 1

## 2018-11-07 NOTE — Progress Notes (Addendum)
PROGRESS NOTE    [name]  EPP:[id number] DOB: 10-10-1965 DOA: 11/04/2018 PCP: Patient, No Pcp Per   Brief Narrative:  [name] is a 53 y.o. female with medical history significant of recurrent thrombocytopenia suspected to be due to ITP, diabetes, intracranial hemorrhage, status post right BKA following diabetic ulcer, osteoarthritis, hypertension who presented to ED on 11/04/2018 with complaint of hematuria which started last Friday and continued.  Mild dysuria.  This has gradually progressed to bloody stools.    No other complaint.  Patient was evaluated and found to have stable hemoglobin at this point but guaiac positive stool and evidence of hematuria.  Platelets is found to be only 5. Her last work-up and follow-up was in 2017.  It was not as severe as this episode.  She was otherwise hemodynamically stable upon arrival to the emergency department.  Head CT showed left temporal lobe encephalomalacia but otherwise no significant findings. She has been admitted for further work-up.  She received 1 unit of PRBC transfusion for platelets of less than 7.  This actually dropped her platelets to 5.  This indicated ITP.  She was started on dexamethasone 40 mg p.o. daily for 4 doses with the first dose being on 11/05/2018.  Her platelets have improved to 29 today.  Oncology was consulted and she is going to be started on IVIG starting today.   Assessment & Plan:   Principal Problem:   Thrombocytopenia ([place]) Active Problems:   Essential hypertension   Diabetes mellitus ([place])   Occult blood positive stool   Benign essential HTN   CKD (chronic kidney disease) stage 3, GFR 30-59 ml/min  Hematuria/guaiac positive/thrombocytopenia/?  ITP: Patient received 1 unit of platelets at the time of admission.  Her platelet only improved to 7.  She was started on dexamethasone 40 mg p.o. daily for 4 days on 11/05/2018 and oncology was consulted.  Her platelets have improved today up to 29 and  she has clear urine today.  She is going to receive her third dose of dexamethasone and will be started on IVIG per oncology.  Type 2 diabetes mellitus: Blood sugar still elevated secondary to being on dexamethasone despite of receiving 15 units of Lantus yesterday.  Increase Lantus to 25 units and continue current dose of SSI and premeal.  Essential hypertension: Blood pressure still slightly elevated.  Will increase amlodipine to 10 mg and continue as needed hydralazine.  Severe vitamin B12 deficiency: Received an injection of IM vitamin B12 patient yesterday.  Will start on p.o. replacement.  Chronic anemia: Her hemoglobin is at her baseline around 9.  MCV borderline high.  Wondering if her anemia is likely combination of macrocytic anemia due to B12 deficiency and anemia of chronic disease.  Hemoglobin is stable today.  Check iron studies.  CKD stage IIIB: At baseline.  Continue to watch.  Hyperkalemia: Resolved.  DVT prophylaxis: SCD  code Status: Full code Family Communication:  None present at bedside.  Plan of care discussed with patient in length and he verbalized understanding and agreed with it. Disposition Plan: TBD  Estimated body mass index is 37.68 kg/m as calculated from the following:   Height as of this encounter: 5\' 6"  (1.676 m).   Weight as of this encounter: 105.9 kg.  Pressure Ulcer 08/24/15 Unstageable - Full thickness tissue loss in which the base of the ulcer is covered by slough (yellow, tan, gray, green or brown) and/or eschar (tan, brown or black) in the wound bed. this  is NOT a pressure injury, it is a full thi (Active)  08/24/15 0045  Location: Buttocks  Location Orientation: Left  Staging: Unstageable - Full thickness tissue loss in which the base of the ulcer is covered by slough (yellow, tan, gray, green or brown) and/or eschar (tan, brown or black) in the wound bed.  Wound Description (Comments): this is NOT a pressure injury, it is a full thickness  wound  Present on Admission: Yes     Nutritional status:               Consultants:   Heme/oncology  Procedures:   None  Antimicrobials:   None   Subjective: Seen and examined.  She tells me that she feels better.  She is not having any dysuria anymore and her urine is clear.  No new complaint.  Objective: Vitals:   11/06/18 1942 11/07/18 0451 11/07/18 0455 11/07/18 0623  BP: (!) 163/72 (!) 184/89  (!) 164/74  Pulse: 92 84  88  Resp: 20 20    Temp: 97.7 F (36.5 C) 97.9 F (36.6 C)    TempSrc: Oral Oral    SpO2: 100% 97%    Weight:   105.9 kg   Height:        Intake/Output Summary (Last 24 hours) at 11/07/2018 0927 Last data filed at 11/07/2018 [id number] Gross per 24 hour  Intake 1120 ml  Output 2725 ml  Net -1605 ml   Filed Weights   11/05/18 0237 11/06/18 0514 11/07/18 0455  Weight: 94.3 kg 102.7 kg 105.9 kg    Examination:  General exam: Appears calm and comfortable  Respiratory system: Clear to auscultation. Respiratory effort normal. Cardiovascular system: S1 & S2 heard, RRR. No JVD, murmurs, rubs, gallops or clicks. No pedal edema. Gastrointestinal system: Abdomen is nondistended, soft and nontender. No organomegaly or masses felt. Normal bowel sounds heard. Central nervous system: Alert and oriented. No focal neurological deficits. Extremities: Symmetric 5 x 5 power. Skin: No rashes, lesions or ulcers.  Psychiatry: Judgement and insight appear poor. Mood & affect appropriate.   Data Reviewed: I have personally reviewed following labs and imaging studies  CBC: Recent Labs  Lab 11/02/18 0845 11/04/18 2038 11/05/18 0624 11/05/18 1118 11/06/18 0633 11/07/18 0412  WBC CANCELED 4.5 5.5  --  4.6 6.2  NEUTROABS  --  2.9  --   --  4.0 5.3  HGB  --  10.1* 10.0*  --  9.6* 10.1*  HCT  --  32.7* 31.4* 26.4* 30.0* 31.0*  MCV  --  97.9 95.4  --  93.8 94.5  PLT  --  <5* 7*  --  8* 29*   Basic Metabolic Panel: Recent Labs  Lab 11/04/18 2038  11/05/18 0624 11/06/18 0021 11/06/18 0633 11/07/18 0412  NA 136 138  --  135 133*  K 5.4* 5.3*  --  4.7 4.7  CL 107 108  --  104 103  CO2 18* 19*  --  19* 19*  GLUCOSE 154* 130* 449* 369* 369*  BUN 34* 35*  --  39* 43*  CREATININE 1.53* 1.71*  --  1.83* 1.63*  CALCIUM 8.8* 8.7*  --  9.2 9.5   GFR: Estimated Creatinine Clearance: 50.2 mL/min (A) (by C-G formula based on SCr of 1.63 mg/dL (H)). Liver Function Tests: Recent Labs  Lab 11/05/18 0001 11/05/18 0624  AST 37 31  ALT 36 34  ALKPHOS 68 69  BILITOT 0.6 0.6  PROT 7.6 6.9  ALBUMIN 3.8 3.8  No results for input(s): LIPASE, AMYLASE in the last 168 hours. No results for input(s): AMMONIA in the last 168 hours. Coagulation Profile: Recent Labs  Lab 11/02/18 0845 11/05/18 0001  INR WILL FOLLOW 1.2   Cardiac Enzymes: No results for input(s): CKTOTAL, CKMB, CKMBINDEX, TROPONINI in the last 168 hours. BNP (last 3 results) No results for input(s): PROBNP in the last 8760 hours. HbA1C: Recent Labs    11/05/18 1118  HGBA1C 6.0*   CBG: Recent Labs  Lab 11/06/18 0702 11/06/18 1210 11/06/18 1626 11/06/18 2200 11/07/18 0627  GLUCAP 351* 340* 409* 418* 326*   Lipid Profile: No results for input(s): CHOL, HDL, LDLCALC, TRIG, CHOLHDL, LDLDIRECT in the last 72 hours. Thyroid Function Tests: Recent Labs    11/05/18 1118  TSH 3.224   Anemia Panel: Recent Labs    11/05/18 1118  VITAMINB12 75*   Sepsis Labs: No results for input(s): PROCALCITON, LATICACIDVEN in the last 168 hours.  Recent Results (from the past 240 hour(s))  SARS CORONAVIRUS 2 (TAT 6-24 HRS) Nasopharyngeal Nasopharyngeal Swab     Status: None   Collection Time: 11/05/18 12:07 AM   Specimen: Nasopharyngeal Swab  Result Value Ref Range Status   SARS Coronavirus 2 NEGATIVE NEGATIVE Final    Comment: (NOTE) SARS-CoV-2 target nucleic acids are NOT DETECTED. The SARS-CoV-2 RNA is generally detectable in upper and lower respiratory specimens  during the acute phase of infection. Negative results do not preclude SARS-CoV-2 infection, do not rule out co-infections with other pathogens, and should not be used as the sole basis for treatment or other patient management decisions. Negative results must be combined with clinical observations, patient history, and epidemiological information. The expected result is Negative. Fact Sheet for Patients: HairSlick.no Fact Sheet for Healthcare Providers: quierodirigir.com This test is not yet approved or cleared by the [place] FDA and  has been authorized for detection and/or diagnosis of SARS-CoV-2 by FDA under an Emergency Use Authorization (EUA). This EUA will remain  in effect (meaning this test can be used) for the duration of the COVID-19 declaration under Section 56 4(b)(1) of the Act, 21 U.S.C. section 360bbb-3(b)(1), unless the authorization is terminated or revoked sooner. Performed at [facility], [address], [place], [place] [address]       Radiology Studies: No results found.  Scheduled Meds: . sodium chloride   Intravenous Once  . acetaminophen  650 mg Oral Once  . amLODipine  10 mg Oral Daily  . calcium carbonate  2 tablet Oral TID  . cholecalciferol  5,000 Units Oral Daily  . dexamethasone  40 mg Oral Daily  . diphenhydrAMINE  25 mg Oral Once  . gabapentin  400 mg Oral TID  . insulin aspart  0-20 Units Subcutaneous TID WC  . insulin aspart  0-5 Units Subcutaneous QHS  . insulin glargine  25 Units Subcutaneous Daily   Continuous Infusions: . sodium chloride    . IMMUNE GLOBULIN 10% (HUMAN) IV - For Fluid Restriction Only       LOS: 2 days   Time spent: 27 minutes  Hughie Closs, MD [facility]  11/07/2018, 9:27 AM   To contact the attending provider between 7A-7P or the covering provider during after hours 7P-7A, please log into the web site [email or web address] and use password  [email or web address].

## 2018-11-07 NOTE — Progress Notes (Addendum)
HEMATOLOGY-ONCOLOGY PROGRESS NOTE  SUBJECTIVE: Continues on Dexamethasone. IVIG currently infusing. Tolerating infusion well. Denies bleeding.    REVIEW OF SYSTEMS:   Constitutional: Denies fevers, chills Respiratory: Denies cough, dyspnea or wheezes Cardiovascular: Denies palpitation, chest discomfort Gastrointestinal:  Denies nausea, heartburn or change in bowel habits Skin: Reports bruising and petechiae Lymphatics: Denies new lymphadenopathy  Neurological: Denies numbness, tingling or new weaknesses Behavioral/Psych: Mood is stable, no new changes  All other systems were reviewed with the patient and are negative.  I have reviewed the past medical history, past surgical history, social history and family history with the patient and they are unchanged from previous note.   PHYSICAL EXAMINATION: ECOG PERFORMANCE STATUS: 1 - Symptomatic but completely ambulatory  Vitals:   11/07/18 1241 11/07/18 1257  BP: (!) 163/74 (!) 153/60  Pulse: 87 92  Resp:    Temp:    SpO2:     Filed Weights   11/05/18 0237 11/06/18 0514 11/07/18 0455  Weight: 208 lb (94.3 kg) 226 lb 6.6 oz (102.7 kg) 233 lb 7.5 oz (105.9 kg)    Intake/Output from previous day: 11/02 0701 - 11/03 0700 In: 1200 [P.O.:1200] Out: 2725 [Urine:2725]  GENERAL:alert, no distress and comfortable SKIN: Multiple bruises and petechiae noted EYES: normal, Conjunctiva are pink and non-injected, sclera clear OROPHARYNX:no exudate, no erythema and lips, buccal mucosa, and tongue normal  NECK: supple, thyroid normal size, non-tender, without nodularity LYMPH:  no palpable lymphadenopathy in the cervical, axillary or inguinal LUNGS: clear to auscultation and percussion with normal breathing effort HEART: regular rate & rhythm and no murmurs and no lower extremity edema ABDOMEN:abdomen soft, non-tender and normal bowel sounds Musculoskeletal:no cyanosis of digits and no clubbing, S/P right BKA  NEURO: alert & oriented x 3  with fluent speech, no focal motor/sensory deficits  LABORATORY DATA:  I have reviewed the data as listed CMP Latest Ref Rng & Units 11/07/2018 11/06/2018 11/06/2018  Glucose 70 - 99 mg/dL 369(H) 369(H) 449(H)  BUN 6 - 20 mg/dL 43(H) 39(H) -  Creatinine 0.44 - 1.00 mg/dL 1.63(H) 1.83(H) -  Sodium 135 - 145 mmol/L 133(L) 135 -  Potassium 3.5 - 5.1 mmol/L 4.7 4.7 -  Chloride 98 - 111 mmol/L 103 104 -  CO2 22 - 32 mmol/L 19(L) 19(L) -  Calcium 8.9 - 10.3 mg/dL 9.5 9.2 -  Total Protein 6.5 - 8.1 g/dL - - -  Total Bilirubin 0.3 - 1.2 mg/dL - - -  Alkaline Phos 38 - 126 U/L - - -  AST 15 - 41 U/L - - -  ALT 0 - 44 U/L - - -    Lab Results  Component Value Date   WBC 6.2 11/07/2018   HGB 10.1 (L) 11/07/2018   HCT 31.0 (L) 11/07/2018   MCV 94.5 11/07/2018   PLT 29 (LL) 11/07/2018   NEUTROABS 5.3 11/07/2018    Ct Head Wo Contrast  Result Date: 11/05/2018 CLINICAL DATA:  Thrombocytopenia EXAM: CT HEAD WITHOUT CONTRAST TECHNIQUE: Contiguous axial images were obtained from the base of the skull through the vertex without intravenous contrast. COMPARISON:  None. FINDINGS: Brain: There is no mass, hemorrhage or extra-axial collection. Left temporal lobe encephalomalacia. The brain parenchyma is normal, without acute or chronic infarction. Vascular: No abnormal hyperdensity of the major intracranial arteries or dural venous sinuses. No intracranial atherosclerosis. Skull: Remote left pterional craniotomy. Sinuses/Orbits: No fluid levels or advanced mucosal thickening of the visualized paranasal sinuses. No mastoid or middle ear effusion. The orbits are normal.  IMPRESSION: Left temporal lobe encephalomalacia. No acute intracranial abnormality. Electronically Signed   By: Deatra Robinson M.D.   On: 11/05/2018 00:12    ASSESSMENT AND PLAN: 1. Acute exacerbation of ITP 2. Anemia 3. Diabetes mellitus 4. Stage III CKD 5. HTN  -Currently on Dexamethasone 40 mg daily X 4 days. Today is day 3 of 4.  Continue Dexamethasone 40 mg daily.Receiving dose 1 of 2 of IVIG currently. Platelet count has improved to 29,000 today. Transfuse platelets for active bleeding only. -Hemoglobin is stable. No transfusion indicated today. Monitor.  -Glucose running higher due to dexamethasone. Continue insulin.   LOS: 2 days   Clenton Pare, DNP, AGPCNP-BC, AOCNP 11/07/18  Attending Note  I personally saw the patient, reviewed the chart and examined the patient. T  I agree with the assessment and plan as documented above.   Acute ITP: IVIG and dexamethasone Monitoring closely for platelet counts Once platelet count is more than 50 she can be discharged home

## 2018-11-07 NOTE — Progress Notes (Signed)
Went to check on IVIG, bag completed, went to flush iv and leaking, pt c/o burning with flush, pt did not want to take out iv or get another as she is eating. Will attempt after pt eats per her request

## 2018-11-08 ENCOUNTER — Other Ambulatory Visit: Payer: Self-pay | Admitting: Oncology

## 2018-11-08 ENCOUNTER — Telehealth: Payer: Self-pay | Admitting: Hematology and Oncology

## 2018-11-08 DIAGNOSIS — D696 Thrombocytopenia, unspecified: Secondary | ICD-10-CM

## 2018-11-08 LAB — GLUCOSE, CAPILLARY
Glucose-Capillary: 459 mg/dL — ABNORMAL HIGH (ref 70–99)
Glucose-Capillary: 426 mg/dL — ABNORMAL HIGH (ref 70–99)
Glucose-Capillary: 370 mg/dL — ABNORMAL HIGH (ref 70–99)
Glucose-Capillary: 250 mg/dL — ABNORMAL HIGH (ref 70–99)
Glucose-Capillary: 287 mg/dL — ABNORMAL HIGH (ref 70–99)
Glucose-Capillary: 353 mg/dL — ABNORMAL HIGH (ref 70–99)

## 2018-11-08 LAB — BASIC METABOLIC PANEL
Anion gap: 7 (ref 5–15)
BUN: 62 mg/dL — ABNORMAL HIGH (ref 6–20)
CO2: 21 mmol/L — ABNORMAL LOW (ref 22–32)
Calcium: 8.3 mg/dL — ABNORMAL LOW (ref 8.9–10.3)
Chloride: 101 mmol/L (ref 98–111)
Creatinine, Ser: 1.64 mg/dL — ABNORMAL HIGH (ref 0.44–1.00)
GFR calc Af Amer: 42 mL/min — ABNORMAL LOW (ref >60)
GFR calc non Af Amer: 36 mL/min — ABNORMAL LOW (ref >60)
Glucose, Bld: 453 mg/dL — ABNORMAL HIGH (ref 70–99)
Potassium: 4.8 mmol/L (ref 3.5–5.1)
Sodium: 129 mmol/L — ABNORMAL LOW (ref 135–145)

## 2018-11-08 LAB — CBC WITH DIFFERENTIAL/PLATELET
Abs Immature Granulocytes: 0.05 10*3/uL (ref 0.00–0.07)
Basophils Absolute: 0 10*3/uL (ref 0.0–0.1)
Basophils Relative: 0 %
Eosinophils Absolute: 0 10*3/uL (ref 0.0–0.5)
Eosinophils Relative: 0 %
HCT: 26 % — ABNORMAL LOW (ref 36.0–46.0)
Hemoglobin: 8.4 g/dL — ABNORMAL LOW (ref 12.0–15.0)
Immature Granulocytes: 1 %
Lymphocytes Relative: 7 %
Lymphs Abs: 0.4 10*3/uL — ABNORMAL LOW (ref 0.7–4.0)
MCH: 30.4 pg (ref 26.0–34.0)
MCHC: 32.3 g/dL (ref 30.0–36.0)
MCV: 94.2 fL (ref 80.0–100.0)
Monocytes Absolute: 0.2 10*3/uL (ref 0.1–1.0)
Monocytes Relative: 3 %
Neutro Abs: 4.7 10*3/uL (ref 1.7–7.7)
Neutrophils Relative %: 89 %
Platelets: 66 10*3/uL — ABNORMAL LOW (ref 150–400)
RBC: 2.76 MIL/uL — ABNORMAL LOW (ref 3.87–5.11)
RDW: 14.6 % (ref 11.5–15.5)
WBC: 5.3 10*3/uL (ref 4.0–10.5)
nRBC: 0 % (ref 0.0–0.2)

## 2018-11-08 MED ORDER — INSULIN ASPART 100 UNIT/ML ~~LOC~~ SOLN
20.0000 [IU] | Freq: Once | SUBCUTANEOUS | Status: AC
  Administered 2018-11-08: 20 [IU] via SUBCUTANEOUS

## 2018-11-08 MED ORDER — CYANOCOBALAMIN 1000 MCG/ML IJ SOLN
1000.0000 ug | Freq: Every day | INTRAMUSCULAR | Status: AC
  Administered 2018-11-08 – 2018-11-11 (×4): 1000 ug via SUBCUTANEOUS
  Filled 2018-11-08 (×4): qty 1

## 2018-11-08 MED ORDER — ACETAMINOPHEN 325 MG PO TABS
650.0000 mg | ORAL_TABLET | Freq: Four times a day (QID) | ORAL | Status: DC | PRN
  Administered 2018-11-09 – 2018-11-13 (×4): 650 mg via ORAL

## 2018-11-08 MED ORDER — DIPHENHYDRAMINE HCL 25 MG PO CAPS
25.0000 mg | ORAL_CAPSULE | Freq: Four times a day (QID) | ORAL | Status: DC | PRN
  Administered 2018-11-08: 25 mg via ORAL
  Filled 2018-11-08: qty 1

## 2018-11-08 MED ORDER — INSULIN ASPART 100 UNIT/ML ~~LOC~~ SOLN: 20.0000 [IU] | Freq: Once | SUBCUTANEOUS | Status: AC

## 2018-11-08 NOTE — Progress Notes (Addendum)
Order for bladder scan, pt does not feel need to pee,600 about an hour ago. Bladder scan 807, messaged to Dr Janina Mayo, order for cath and to check for clots as well Addendum- in and out cath performed with Tai charge nurse, 917m clear yellow urine returned, messaged Dr SVerlon Au

## 2018-11-08 NOTE — Progress Notes (Signed)
HEMATOLOGY-ONCOLOGY PROGRESS NOTE  SUBJECTIVE: Received 4th dose of Dexamethasone today. 2nd dose of IVIG currently infusing. Tolerating well. Denies bleeding.     REVIEW OF SYSTEMS:   Constitutional: Denies fevers, chills Respiratory: Denies cough, dyspnea or wheezes Cardiovascular: Denies palpitation, chest discomfort Gastrointestinal:  Denies nausea, heartburn or change in bowel habits Skin: Reports bruising and petechiae Lymphatics: Denies new lymphadenopathy  Neurological: Denies numbness, tingling or new weaknesses Behavioral/Psych: Mood is stable, no new changes  All other systems were reviewed with the patient and are negative.  I have reviewed the past medical history, past surgical history, social history and family history with the patient and they are unchanged from previous note.   PHYSICAL EXAMINATION: ECOG PERFORMANCE STATUS: 1 - Symptomatic but completely ambulatory  Vitals:   11/08/18 0500 11/08/18 0813  BP:  (!) 162/82  Pulse:  80  Resp:  20  Temp:  98 F (36.7 C)  SpO2: 100% 98%   Filed Weights   11/06/18 0514 11/07/18 0455 11/08/18 0700  Weight: 226 lb 6.6 oz (102.7 kg) 233 lb 7.5 oz (105.9 kg) 235 lb 14.3 oz (107 kg)    Intake/Output from previous day: 11/03 0701 - 11/04 0700 In: 2754.3 [P.O.:1960; I.V.:794.3] Out: 2950 [Urine:2950]  GENERAL:alert, no distress and comfortable SKIN: Multiple bruises and petechiae noted EYES: normal, Conjunctiva are pink and non-injected, sclera clear OROPHARYNX:no exudate, no erythema and lips, buccal mucosa, and tongue normal  NECK: supple, thyroid normal size, non-tender, without nodularity LYMPH:  no palpable lymphadenopathy in the cervical, axillary or inguinal LUNGS: clear to auscultation and percussion with normal breathing effort HEART: regular rate & rhythm and no murmurs and no lower extremity edema ABDOMEN:abdomen soft, non-tender and normal bowel sounds Musculoskeletal:no cyanosis of digits and no  clubbing, S/P right BKA  NEURO: alert & oriented x 3 with fluent speech, no focal motor/sensory deficits  LABORATORY DATA:  I have reviewed the data as listed CMP Latest Ref Rng & Units 11/08/2018 11/07/2018 11/06/2018  Glucose 70 - 99 mg/dL 453(H) 369(H) 369(H)  BUN 6 - 20 mg/dL 62(H) 43(H) 39(H)  Creatinine 0.44 - 1.00 mg/dL 1.64(H) 1.63(H) 1.83(H)  Sodium 135 - 145 mmol/L 129(L) 133(L) 135  Potassium 3.5 - 5.1 mmol/L 4.8 4.7 4.7  Chloride 98 - 111 mmol/L 101 103 104  CO2 22 - 32 mmol/L 21(L) 19(L) 19(L)  Calcium 8.9 - 10.3 mg/dL 8.3(L) 9.5 9.2  Total Protein 6.5 - 8.1 g/dL - - -  Total Bilirubin 0.3 - 1.2 mg/dL - - -  Alkaline Phos 38 - 126 U/L - - -  AST 15 - 41 U/L - - -  ALT 0 - 44 U/L - - -    Lab Results  Component Value Date   WBC 5.3 11/08/2018   HGB 8.4 (L) 11/08/2018   HCT 26.0 (L) 11/08/2018   MCV 94.2 11/08/2018   PLT 66 (L) 11/08/2018   NEUTROABS 4.7 11/08/2018    Ct Head Wo Contrast  Result Date: 11/05/2018 CLINICAL DATA:  Thrombocytopenia EXAM: CT HEAD WITHOUT CONTRAST TECHNIQUE: Contiguous axial images were obtained from the base of the skull through the vertex without intravenous contrast. COMPARISON:  None. FINDINGS: Brain: There is no mass, hemorrhage or extra-axial collection. Left temporal lobe encephalomalacia. The brain parenchyma is normal, without acute or chronic infarction. Vascular: No abnormal hyperdensity of the major intracranial arteries or dural venous sinuses. No intracranial atherosclerosis. Skull: Remote left pterional craniotomy. Sinuses/Orbits: No fluid levels or advanced mucosal thickening of the visualized paranasal  sinuses. No mastoid or middle ear effusion. The orbits are normal. IMPRESSION: Left temporal lobe encephalomalacia. No acute intracranial abnormality. Electronically Signed   By: Deatra Robinson M.D.   On: 11/05/2018 00:12    ASSESSMENT AND PLAN: 1. Acute exacerbation of ITP 2. Anemia 3. Diabetes mellitus 4. Stage III CKD 5.  HTN  -Received dose 4 of 4 planned doses of Dexamethasone 40 mg daily. Receiving dose 2 of 2 planned doses of IVIG currently. Platelet count has improved to 66,000 today. No need for any additional dexamethasone or IVIG after today. The patient may discharge to home from our standpoint when otherwise medically stable. We will see her as an outpatient next week on 11/16/2018 to repeat her CBC.  -Hemoglobin down slightly today. No bleeding. Iron studies and folate are normal. I note that her Vitamin B12 level was low at 75 on 11/05/2018 and she received a 1 time dose of Vitamin B12 1000 mcg IM on 11/06/2018. I will order Vitamin B12 1000 mcg sq daily X4 more days.  -Glucose running higher due to dexamethasone. Continue insulin. Anticipate that glucose control will improve now that dexamethasone is being stopped.    LOS: 3 days   Clenton Pare, DNP, AGPCNP-BC, AOCNP 11/08/18

## 2018-11-08 NOTE — Progress Notes (Signed)
Hospitalist progress note  If 7PM-7AM,  night-coverage-look on AMION -prefer pages-not epic chat,please  [name]  ZSW:[id number] DOB: [birth year] DOA: 11/04/2018 PCP: Patient, No Pcp Per  Narrative:  53 y/o AAF ICH status post left craniotomy DM TY 2, CKD 3, right BKA 2/2 DM foot ulcer 07/2011 neuropathy with prior cellulitis 01/30/2018, HTN, cataracts with poor visual acuity, hypothyroid?,  OSA not on CPAP, prior chronic Foley catheter for retention, multiple decubitus ulcers in the past and.  Prior admission 11/2015 orthostatic hypotension, polypharmacy, Thrombocytopenia-bone marrow biopsy 08/25/2015 hypercellular marrow with no evidence of malignancy Admit to [facility] 11/05/2018 dysuria hematuria-work-up showed guaiac positive school + hematuria platelets only 5000-transfused 2 units of platelets and was admitted Oncology saw the patient felt that she had an acute exacerbation of chronic ITP started on dexamethasone and then eventually started on IVIG   Assessment & Plan: Acute exacerbation of ITP Resolving slowly-receiving IVIG, Decadron 40 daily as per oncologist Platelet count now 66 up from 29-possibly can stop meds in the next day or so CKD stage IIIb Hematuria Would bladder scan to ensure no clots causing worsening renal function Continuing saline at 100 cc/h DM TY 2 with neuropathy Continue high-dose insulin as patient is on steroids expect not to be controlled given 20 extra units today covering with sliding scale and if continues to receive p.o. steroids per oncologist will increase Lantus dose later on today HTN Continue amlodipine 10 and hydralazine IV as needed for blood pressure above 160 We will add oral nitrate if still uncontrolled in a.m. Severe B12 deficiency B12 injections given on 11/4 and on p.o. replacement-we will need q. weekly dosing of IV therapy Hypothyroid Needs outpatient work-up OSA not on BiPAP Considering sleep study in the outpatient  setting Poor visual acuity Outpatient work-up Orthostatic hypotension in the past   DVT prophylaxis: SCD  Code Status:   Full   Family Communication:   None Disposition Plan: Inpatient  Consultants:   Oncology Procedures:   CT Antimicrobials:   nojne  Subjective: Confused and asking questions abdomen on my exam Nurse says that this is baseline Patient in no distress Urine is clear   Objective: Vitals:   11/07/18 2050 11/08/18 0438 11/08/18 0500 11/08/18 0700  BP: (!) 158/71 (!) 164/89    Pulse: 85 77    Resp: 20 20    Temp: 98.5 F (36.9 C) 98.6 F (37 C)    TempSrc: Oral Oral    SpO2: 100% (!) 87% 100%   Weight:    107 kg  Height:        Intake/Output Summary (Last 24 hours) at 11/08/2018 0800 Last data filed at 11/08/2018 0630 Gross per 24 hour  Intake 2754.33 ml  Output 2950 ml  Net -195.67 ml   Filed Weights   11/06/18 0514 11/07/18 0455 11/08/18 0700  Weight: 102.7 kg 105.9 kg 107 kg    Examination: Alert obese no distress EOMI NCAT thick neck Mallampati 3 No murmur rub or gallop No bruit Chest clinically clear no added sound.  Lower extremities are soft nontender abdomen is obese and enlarged Cranial nerves grossly intact  Data Reviewed: I have personally reviewed following labs and imaging studies CBC: Recent Labs  Lab 11/04/18 2038 11/05/18 0624 11/05/18 1118 11/06/18 0633 11/07/18 0412 11/08/18 0359  WBC 4.5 5.5  --  4.6 6.2 5.3  NEUTROABS 2.9  --   --  4.0 5.3 4.7  HGB 10.1* 10.0*  --  9.6* 10.1* 8.4*  HCT 32.7* 31.4* 26.4* 30.0* 31.0* 26.0*  MCV 97.9 95.4  --  93.8 94.5 94.2  PLT <5* 7*  --  8* 29* 66*   Basic Metabolic Panel: Recent Labs  Lab 11/04/18 2038 11/05/18 0624 11/06/18 0021 11/06/18 0633 11/07/18 0412 11/08/18 0359  NA 136 138  --  135 133* 129*  K 5.4* 5.3*  --  4.7 4.7 4.8  CL 107 108  --  104 103 101  CO2 18* 19*  --  19* 19* 21*  GLUCOSE 154* 130* 449* 369* 369* 453*  BUN 34* 35*  --  39* 43* 62*   CREATININE 1.53* 1.71*  --  1.83* 1.63* 1.64*  CALCIUM 8.8* 8.7*  --  9.2 9.5 8.3*   GFR: Estimated Creatinine Clearance: 50.2 mL/min (A) (by C-G formula based on SCr of 1.64 mg/dL (H)). Liver Function Tests: Recent Labs  Lab 11/05/18 0001 11/05/18 0624  AST 37 31  ALT 36 34  ALKPHOS 68 69  BILITOT 0.6 0.6  PROT 7.6 6.9  ALBUMIN 3.8 3.8   No results for input(s): LIPASE, AMYLASE in the last 168 hours. No results for input(s): AMMONIA in the last 168 hours. Coagulation Profile: Recent Labs  Lab 11/02/18 0845 11/05/18 0001  INR WILL FOLLOW 1.2   Cardiac Enzymes: Radiology Studies: Reviewed images personally in health database  Scheduled Meds: . sodium chloride   Intravenous Once  . acetaminophen  650 mg Oral Once  . amLODipine  10 mg Oral Daily  . calcium carbonate  2 tablet Oral TID  . cholecalciferol  5,000 Units Oral Daily  . dexamethasone  40 mg Oral Daily  . diphenhydrAMINE  25 mg Oral Once  . gabapentin  400 mg Oral TID  . insulin aspart  0-20 Units Subcutaneous TID WC  . insulin aspart  0-5 Units Subcutaneous QHS  . insulin aspart  20 Units Subcutaneous Once  . insulin glargine  25 Units Subcutaneous Daily   Continuous Infusions: . sodium chloride    . IMMUNE GLOBULIN 10% (HUMAN) IV - For Fluid Restriction Only Stopped (11/07/18 1810)    LOS: 3 days   Time spent:  Verneita Griffes, MD [facility]  11/08/2018, 8:00 AM

## 2018-11-08 NOTE — Telephone Encounter (Signed)
Scheduled appt per 11/4 sch message - no answer and no vmail. Message K. Curcio to let her know

## 2018-11-09 LAB — CBC WITH DIFFERENTIAL/PLATELET
Abs Immature Granulocytes: 0.05 10*3/uL (ref 0.00–0.07)
Basophils Absolute: 0 10*3/uL (ref 0.0–0.1)
Basophils Relative: 0 %
Eosinophils Absolute: 0 10*3/uL (ref 0.0–0.5)
Eosinophils Relative: 0 %
HCT: 27.3 % — ABNORMAL LOW (ref 36.0–46.0)
Hemoglobin: 8.6 g/dL — ABNORMAL LOW (ref 12.0–15.0)
Immature Granulocytes: 1 %
Lymphocytes Relative: 7 %
Lymphs Abs: 0.4 10*3/uL — ABNORMAL LOW (ref 0.7–4.0)
MCH: 29.9 pg (ref 26.0–34.0)
MCHC: 31.5 g/dL (ref 30.0–36.0)
MCV: 94.8 fL (ref 80.0–100.0)
Monocytes Absolute: 0.2 10*3/uL (ref 0.1–1.0)
Monocytes Relative: 4 %
Neutro Abs: 4.2 10*3/uL (ref 1.7–7.7)
Neutrophils Relative %: 88 %
Platelets: 128 10*3/uL — ABNORMAL LOW (ref 150–400)
RBC: 2.88 MIL/uL — ABNORMAL LOW (ref 3.87–5.11)
RDW: 14.6 % (ref 11.5–15.5)
WBC: 4.8 10*3/uL (ref 4.0–10.5)
nRBC: 0 % (ref 0.0–0.2)

## 2018-11-09 LAB — COMPREHENSIVE METABOLIC PANEL
ALT: 27 U/L (ref 0–44)
AST: 26 U/L (ref 15–41)
Albumin: 2.5 g/dL — ABNORMAL LOW (ref 3.5–5.0)
Alkaline Phosphatase: 49 U/L (ref 38–126)
Anion gap: 7 (ref 5–15)
BUN: 70 mg/dL — ABNORMAL HIGH (ref 6–20)
CO2: 22 mmol/L (ref 22–32)
Calcium: 8.5 mg/dL — ABNORMAL LOW (ref 8.9–10.3)
Chloride: 99 mmol/L (ref 98–111)
Creatinine, Ser: 1.61 mg/dL — ABNORMAL HIGH (ref 0.44–1.00)
GFR calc Af Amer: 42 mL/min — ABNORMAL LOW (ref >60)
GFR calc non Af Amer: 37 mL/min — ABNORMAL LOW (ref >60)
Glucose, Bld: 400 mg/dL — ABNORMAL HIGH (ref 70–99)
Potassium: 4.4 mmol/L (ref 3.5–5.1)
Sodium: 128 mmol/L — ABNORMAL LOW (ref 135–145)
Total Bilirubin: 0.2 mg/dL — ABNORMAL LOW (ref 0.3–1.2)
Total Protein: 9 g/dL — ABNORMAL HIGH (ref 6.5–8.1)

## 2018-11-09 LAB — GLUCOSE, CAPILLARY
Glucose-Capillary: 372 mg/dL — ABNORMAL HIGH (ref 70–99)
Glucose-Capillary: 362 mg/dL — ABNORMAL HIGH (ref 70–99)
Glucose-Capillary: 212 mg/dL — ABNORMAL HIGH (ref 70–99)
Glucose-Capillary: 312 mg/dL — ABNORMAL HIGH (ref 70–99)

## 2018-11-09 LAB — CREATININE, URINE, RANDOM: Creatinine, Urine: 34.46 mg/dL

## 2018-11-09 LAB — SODIUM, URINE, RANDOM: Sodium, Ur: 36 mmol/L

## 2018-11-09 MED ORDER — FUROSEMIDE 10 MG/ML IJ SOLN
40.0000 mg | Freq: Once | INTRAMUSCULAR | Status: DC
  Filled 2018-11-09: qty 4

## 2018-11-09 MED ORDER — CHLORHEXIDINE GLUCONATE CLOTH 2 % EX PADS
6.0000 | MEDICATED_PAD | Freq: Every day | CUTANEOUS | Status: DC
  Administered 2018-11-11 – 2018-11-13 (×3): 6 via TOPICAL

## 2018-11-09 MED ORDER — INSULIN GLARGINE 100 UNIT/ML ~~LOC~~ SOLN
30.0000 [IU] | Freq: Every day | SUBCUTANEOUS | Status: DC
  Filled 2018-11-09: qty 0.3

## 2018-11-09 NOTE — Progress Notes (Addendum)
RN paged [facility] regarding fluids previously not started from patient admission on 11/1.  Requested advice on whether or not to start fluids since the order was from several days ago.

## 2018-11-09 NOTE — Progress Notes (Signed)
Chaplain engaged in initial visit with [name].  [name] expressed feeling "pressure" and anxiety.  She is anxious over concerns of paying her mortgage while she is in the hospital, a pending court-date regarding custody of her son, and paying for surgery on her eyes as well as the medical bills she has now.  When Chaplain asked if [name] had insurance, a concern she voiced, [name] was not able to answer the question.  She stated, "I don't think I do" or "I'm not sure."  Chaplain suggested that [name] speak with Social Work or Case Management about resources that may be available to her concerning insurance, pending procedures, and medicine she may need to pay for.  [name] expressed being in the hospital as a "setback," amongst the progress she has made as well as amongst all the things she needs to get done.  Chaplain assesses that [name] feels stressed out by financial burdens, as well as what is happening with her son.    Chaplain worked to let [name] know that she is not alone and that she has the power to advocate on her own behalf.  Chaplain wants to help [name] get to a point of asking for the help that she needs.  Chaplain and [name] prayed together in hopes of relieving the immense pressure she has been feeling about being hospitalized, as well as her life overall.

## 2018-11-09 NOTE — TOC Initial Note (Signed)
Transition of Care [facility]) - Initial/Assessment Note    Patient Details  Name: [name] MRN: [id number] Date of Birth: Dec 03, 1965  [facility] CM/SW Contact:    Alberteen Sam, [place] Phone Number: [phone] 11/09/2018, 9:38 AM  Clinical Narrative:                  CSW spoke with patient regarding discharge planning, patient reports she is in need of a wheel chair. She reports her last one several years ago was a size 20 and she believes she needs an 66 as this was too big. CSW has reached out to [employer] for wheel chair delivery to room and requested size 18.   CSW inquired as to any further needs or transportation needs at discharge. Patient denies any further needs and states she has transportation when she is discharged.   Expected Discharge Plan: Home/Self Care Barriers to Discharge: Continued Medical Work up   Patient Goals and CMS Choice Patient states their goals for this hospitalization and ongoing recovery are:: to go home CMS Medicare.gov Compare Post Acute Care list provided to:: Patient Choice offered to / list presented to : Patient  Expected Discharge Plan and Services Expected Discharge Plan: Home/Self Care       Living arrangements for the past 2 months: Single Family Home                 DME Arranged: Wheelchair manual DME Agency: [employer] Date DME Agency Contacted: 11/09/18 Time DME Agency Contacted: 0930 Representative spoke with at DME Agency: [place]            Prior Living Arrangements/Services Living arrangements for the past 2 months: [place] Lives with:: Self Patient language and need for interpreter reviewed:: Yes Do you feel safe going back to the place where you live?: Yes      Need for Family Participation in Patient Care: No (Comment) Care giver support system in place?: Yes (comment)   Criminal Activity/Legal Involvement Pertinent to Current Situation/Hospitalization: No - Comment as needed  Activities of  Daily Living Home Assistive Devices/Equipment: Environmental consultant (specify type), Wheelchair, Eyeglasses ADL Screening (condition at time of admission) Patient's cognitive ability adequate to safely complete daily activities?: Yes Is the patient deaf or have difficulty hearing?: No Does the patient have difficulty seeing, even when wearing glasses/contacts?: Yes Does the patient have difficulty concentrating, remembering, or making decisions?: No Patient able to express need for assistance with ADLs?: Yes Does the patient have difficulty dressing or bathing?: No Independently performs ADLs?: Yes (appropriate for developmental age) Does the patient have difficulty walking or climbing stairs?: Yes Weakness of Legs: None Weakness of Arms/Hands: Both  Permission Sought/Granted Permission sought to share information with : Case Manager Permission granted to share information with : Yes, Verbal Permission Granted              Emotional Assessment Appearance:: Appears stated age Attitude/Demeanor/Rapport: Gracious Affect (typically observed): Calm Orientation: : Oriented to Self, Oriented to Place, Oriented to  Time, Oriented to Situation Alcohol / Substance Use: Not Applicable Psych Involvement: No (comment)  Admission diagnosis:  Thrombocytopenia ([place]) [D69.6] Bruising [T14.8XXA] Gross hematuria [R31.0] Patient Active Problem List   Diagnosis Date Noted  . Onychomycosis of toenail 10/16/2018  . Cataract of both eyes 10/16/2018  . Tinea pedis of left foot 10/16/2018  . Decreased vision in both eyes 10/16/2018  . CKD (chronic kidney disease) stage 3, GFR 30-59 ml/min   . Cellulitis of left  lower extremity 01/25/2018  . Hyperkalemia 01/25/2018  . Poor compliance 03/08/2017  . Hypoxia 03/07/2017  . Thyroid nodule 03/07/2017  . Chronic indwelling Foley catheter 03/04/2017  . Type II diabetes mellitus (HCC)   . Femur fracture (HCC) 03/02/2017  . Orthostatic dizziness   . Urinary tract  infection without hematuria   . Enterococcus faecalis infection   . Near syncope 11/25/2015  . Dehydration 11/25/2015  . Orthostatic hypotension 11/25/2015  . UTI (urinary tract infection) 11/25/2015  . Acute worsening of stage 3 chronic kidney disease 09/24/2015  . Diabetes mellitus due to underlying condition with chronic kidney disease, without long-term current use of insulin (HCC)   . Fever   . Status post below knee amputation of right lower extremity (HCC)   . Diabetic peripheral neuropathy (HCC)   . Neuropathic pain   . Benign essential HTN   . Folliculitis   . Slow transit constipation   . Anemia of chronic disease   . Bilateral hydronephrosis   . Urinary retention   . CKD (chronic kidney disease)   . Uncontrolled type 2 diabetes mellitus with diabetic nephropathy, without long-term current use of insulin (HCC)   . Vasculopathy   . Debility 09/04/2015  . DM type 2 with diabetic peripheral neuropathy (HCC)   . S/P BKA (below knee amputation) unilateral (HCC)   . Medical non-compliance   . Benign skin lesion of multiple sites   . Tachypnea   . Acute blood loss anemia   . Neurogenic bladder   . Occult blood positive stool   . Rectovaginal fistula   . Controlled diabetes mellitus type 2 with complications (HCC)   . Acute on chronic renal failure (HCC)   . Hydronephrosis determined by ultrasound   . Papular rash   . Uncontrolled type 2 diabetes mellitus with complication (HCC)   . Paresthesia   . Thrombocytopenia (HCC) 08/23/2015  . Pressure ulcer 08/23/2015  . Severe anemia 08/23/2015  . Lower urinary tract infectious disease 08/23/2015  . Absolute anemia 08/23/2015  . Peripheral vascular disease, unspecified (HCC) 04/27/2012  . Unilateral complete BKA (HCC) 07/09/2011  . Gas gangrene (HCC) 07/02/2011  . Sepsis(995.91) 07/02/2011  . Acute renal failure (HCC) 07/02/2011  . Diabetes mellitus (HCC) 11/22/2010  . Diabetic foot ulcer associated with type 2 diabetes  mellitus (HCC)   . Essential hypertension   . Arthritis   . Neuropathy (HCC)   . Intracerebral hemorrhage [facility])    PCP:  Patient, No Pcp Per Pharmacy:   [id number] - [place] - [address] [place] ([place]) [place] [address] Phone: [phone] Fax: [phone]     Social Determinants of Health (SDOH) Interventions    Readmission Risk Interventions No flowsheet data found.

## 2018-11-09 NOTE — Progress Notes (Signed)
Hospitalist progress note  If 7PM-7AM,  night-coverage-look on AMION -prefer pages-not epic chat,please  [name]  OQH:[id number] DOB: 03-May-1965 DOA: 11/04/2018 PCP: Patient, No Pcp Per  Narrative:  53 y/o AAF ICH status post left craniotomy DM TY 2, CKD 3, right BKA 2/2 DM foot ulcer 07/2011 neuropathy with prior cellulitis 01/30/2018, HTN, cataracts with poor visual acuity, hypothyroid?,  OSA not on CPAP, prior chronic Foley catheter for retention, multiple decubitus ulcers in the past and.  Prior admission 11/2015 orthostatic hypotension, polypharmacy, Thrombocytopenia-bone marrow biopsy 08/25/2015 hypercellular marrow with no evidence of malignancy Admit to [facility] 11/05/2018 dysuria hematuria-work-up showed guaiac positive school + hematuria platelets only 5000-transfused 2 units of platelets and was admitted Oncology saw the patient felt that she had an acute exacerbation of chronic ITP started on dexamethasone and then eventually started on IVIG   Assessment & Plan: Acute exacerbation of ITP Resolving slowly-receiving IVIG, Decadron 40 daily as per oncologist Platelet count 128 up from 29 CKD stage IIIb Hematuria Did not receive IV saline as reported--RN made me aware today Give IVF 50 cc/h Needs Foley as is retaining urine which is likely a cause for her AKI--await urine studies--hold lasix DM TY 2 with neuropathy CBG uncontrolled 372--400, continue lantus at a higher dose 30 units from 25--expect will improve as steroids have been d/c HTN Continue amlodipine 10 and hydralazine IV as needed for blood pressure above 160 Adding Imdur 15. Severe B12 deficiency B12 injections given on 11/4 and on p.o. replacement-we will need q. weekly dosing of IV therapy Hypothyroid Needs outpatient work-up OSA not on BiPAP Considering sleep study in the outpatient setting Poor visual acuity Outpatient work-up Orthostatic hypotension in the past   DVT prophylaxis: SCD  Code  Status:   Full   Family Communication:   None Disposition Plan: Inpatient  Consultants:   Oncology Procedures:   CT Antimicrobials:   nojne  Subjective: Confused and asking questions abdomen on my exam Nurse says that this is baseline Patient in no distress Urine is clear   Objective: Vitals:   11/08/18 1950 11/09/18 0025 11/09/18 0442 11/09/18 0900  BP: (!) 148/71 (!) 158/76 (!) 163/84 (!) 154/71  Pulse: 83 88 79 81  Resp: '20 18 18 16  ' Temp: 98 F (36.7 C) 98.1 F (36.7 C) 97.8 F (36.6 C) (!) 97.5 F (36.4 C)  TempSrc: Oral Oral Oral Oral  SpO2: 98% 96% 94% 100%  Weight:   104.6 kg   Height:        Intake/Output Summary (Last 24 hours) at 11/09/2018 1025 Last data filed at 11/09/2018 0910 Gross per 24 hour  Intake 2113.2 ml  Output 3950 ml  Net -1836.8 ml   Filed Weights   11/07/18 0455 11/08/18 0700 11/09/18 0442  Weight: 105.9 kg 107 kg 104.6 kg    Examination: Alert obese no distress EOMI NCAT thick neck Mallampati 3 No murmur rub or gallop No bruit Chest clinically clear no added sound.  Lower extremities are soft nontender abdomen is obese and enlarged Cranial nerves grossly intact  Data Reviewed: I have personally reviewed following labs and imaging studies CBC: Recent Labs  Lab 11/04/18 2038 11/05/18 0624 11/05/18 1118 11/06/18 0633 11/07/18 0412 11/08/18 0359 11/09/18 0450  WBC 4.5 5.5  --  4.6 6.2 5.3 4.8  NEUTROABS 2.9  --   --  4.0 5.3 4.7 4.2  HGB 10.1* 10.0*  --  9.6* 10.1* 8.4* 8.6*  HCT 32.7* 31.4* 26.4* 30.0* 31.0*  26.0* 27.3*  MCV 97.9 95.4  --  93.8 94.5 94.2 94.8  PLT <5* 7*  --  8* 29* 66* 114*   Basic Metabolic Panel: Recent Labs  Lab 11/05/18 0624 11/06/18 0021 11/06/18 0633 11/07/18 0412 11/08/18 0359 11/09/18 0450  NA 138  --  135 133* 129* 128*  K 5.3*  --  4.7 4.7 4.8 4.4  CL 108  --  104 103 101 99  CO2 19*  --  19* 19* 21* 22  GLUCOSE 130* 449* 369* 369* 453* 400*  BUN 35*  --  39* 43* 62* 70*   CREATININE 1.71*  --  1.83* 1.63* 1.64* 1.61*  CALCIUM 8.7*  --  9.2 9.5 8.3* 8.5*   GFR: Estimated Creatinine Clearance: 50.5 mL/min (A) (by C-G formula based on SCr of 1.61 mg/dL (H)). Liver Function Tests: Recent Labs  Lab 11/05/18 0001 11/05/18 0624 11/09/18 0450  AST 37 31 26  ALT 36 34 27  ALKPHOS 68 69 49  BILITOT 0.6 0.6 0.2*  PROT 7.6 6.9 9.0*  ALBUMIN 3.8 3.8 2.5*   No results for input(s): LIPASE, AMYLASE in the last 168 hours. No results for input(s): AMMONIA in the last 168 hours. Coagulation Profile: Recent Labs  Lab 11/05/18 0001  INR 1.2   Cardiac Enzymes: Radiology Studies: Reviewed images personally in health database  Scheduled Meds: . sodium chloride   Intravenous Once  . acetaminophen  650 mg Oral Once  . amLODipine  10 mg Oral Daily  . calcium carbonate  2 tablet Oral TID  . cholecalciferol  5,000 Units Oral Daily  . cyanocobalamin  1,000 mcg Subcutaneous Daily  . furosemide  40 mg Intravenous Once  . gabapentin  400 mg Oral TID  . insulin aspart  0-20 Units Subcutaneous TID WC  . insulin aspart  0-5 Units Subcutaneous QHS  . insulin glargine  25 Units Subcutaneous Daily   Continuous Infusions: . sodium chloride      LOS: 4 days   Time spent:  Verneita Griffes, MD [facility]  11/09/2018, 10:25 AM

## 2018-11-10 LAB — BASIC METABOLIC PANEL
Anion gap: 5 (ref 5–15)
BUN: 60 mg/dL — ABNORMAL HIGH (ref 6–20)
CO2: 24 mmol/L (ref 22–32)
Calcium: 8.3 mg/dL — ABNORMAL LOW (ref 8.9–10.3)
Chloride: 101 mmol/L (ref 98–111)
Creatinine, Ser: 1.38 mg/dL — ABNORMAL HIGH (ref 0.44–1.00)
GFR calc Af Amer: 51 mL/min — ABNORMAL LOW (ref >60)
GFR calc non Af Amer: 44 mL/min — ABNORMAL LOW (ref >60)
Glucose, Bld: 195 mg/dL — ABNORMAL HIGH (ref 70–99)
Potassium: 3.8 mmol/L (ref 3.5–5.1)
Sodium: 130 mmol/L — ABNORMAL LOW (ref 135–145)

## 2018-11-10 LAB — CBC WITH DIFFERENTIAL/PLATELET
Abs Immature Granulocytes: 0.05 10*3/uL (ref 0.00–0.07)
Basophils Absolute: 0 10*3/uL (ref 0.0–0.1)
Basophils Relative: 0 %
Eosinophils Absolute: 0 10*3/uL (ref 0.0–0.5)
Eosinophils Relative: 0 %
HCT: 29.2 % — ABNORMAL LOW (ref 36.0–46.0)
Hemoglobin: 9.3 g/dL — ABNORMAL LOW (ref 12.0–15.0)
Immature Granulocytes: 1 %
Lymphocytes Relative: 8 %
Lymphs Abs: 0.4 10*3/uL — ABNORMAL LOW (ref 0.7–4.0)
MCH: 30.3 pg (ref 26.0–34.0)
MCHC: 31.8 g/dL (ref 30.0–36.0)
MCV: 95.1 fL (ref 80.0–100.0)
Monocytes Absolute: 0.7 10*3/uL (ref 0.1–1.0)
Monocytes Relative: 12 %
Neutro Abs: 4.4 10*3/uL (ref 1.7–7.7)
Neutrophils Relative %: 79 %
Platelets: 217 10*3/uL (ref 150–400)
RBC: 3.07 MIL/uL — ABNORMAL LOW (ref 3.87–5.11)
RDW: 14.6 % (ref 11.5–15.5)
WBC: 5.5 10*3/uL (ref 4.0–10.5)
nRBC: 0 % (ref 0.0–0.2)

## 2018-11-10 LAB — GLUCOSE, CAPILLARY
Glucose-Capillary: 132 mg/dL — ABNORMAL HIGH (ref 70–99)
Glucose-Capillary: 145 mg/dL — ABNORMAL HIGH (ref 70–99)
Glucose-Capillary: 137 mg/dL — ABNORMAL HIGH (ref 70–99)
Glucose-Capillary: 208 mg/dL — ABNORMAL HIGH (ref 70–99)

## 2018-11-10 MED ORDER — INSULIN GLARGINE 100 UNIT/ML ~~LOC~~ SOLN
22.0000 [IU] | Freq: Every day | SUBCUTANEOUS | Status: DC
  Administered 2018-11-10 – 2018-11-12 (×3): 22 [IU] via SUBCUTANEOUS
  Filled 2018-11-10 (×4): qty 0.22

## 2018-11-10 MED ORDER — TAMSULOSIN HCL 0.4 MG PO CAPS
0.4000 mg | ORAL_CAPSULE | Freq: Every day | ORAL | Status: DC
  Administered 2018-11-10 – 2018-11-13 (×4): 0.4 mg via ORAL
  Filled 2018-11-10 (×4): qty 1

## 2018-11-10 MED ORDER — ISOSORBIDE MONONITRATE ER 30 MG PO TB24
15.0000 mg | ORAL_TABLET | Freq: Every day | ORAL | Status: DC
  Administered 2018-11-10 – 2018-11-11 (×2): 15 mg via ORAL
  Filled 2018-11-10 (×2): qty 1

## 2018-11-10 NOTE — Progress Notes (Signed)
Hospitalist progress note  If 7PM-7AM,  night-coverage-look on AMION -prefer pages-not epic chat,please  [name]  MRN:[id number] DOB: 01/17/1965 DOA: 11/04/2018 PCP: Patient, No Pcp Per  Narrative:  52 y/o AAF ICH status post left craniotomy DM TY 2, CKD 3, right BKA 2/2 DM foot ulcer 07/2011 neuropathy with prior cellulitis 01/30/2018, HTN, cataracts with poor visual acuity, hypothyroid?,  OSA not on CPAP, prior chronic Foley catheter for retention, multiple decubitus ulcers in the past and.  Prior admission 11/2015 orthostatic hypotension, polypharmacy, Thrombocytopenia-bone marrow biopsy 08/25/2015 hypercellular marrow with no evidence of malignancy Admit to [facility] 11/05/2018 dysuria hematuria-work-up showed guaiac positive school + hematuria platelets only 5000-transfused 2 units of platelets and was admitted Oncology saw the patient felt that she had an acute exacerbation of chronic ITP started on dexamethasone and then eventually started on IVIG   Assessment & Plan: Acute exacerbation of ITP Resolving slowly-receiving IVIG, Decadron 40 daily as per oncologist Platelet ranges now in the 200s and oncology signed off AKI superimposed on CKD stage IIIb Hematuria, hyponatremia Did not receive IV saline as reported--RN told me this 11/5 Continue saline 50 cc/h--Creatinine has improved with placement of catheter in addition to IV saline as has her hyponatremia Clamp Foley catheter and monitor to see if will pass urine and has sensation to void, may require indwelling Foley on discharge--starting Flomax to see if this makes any effect on her passing urine May need to replace catheter if continues to have issues DM TY 2 with neuropathy CBG now better controlled in the 1 30-1 90 range since discontinuation of steroids-cut back dose Lantus from 30 to 22 units HTN Continue amlodipine 10 and hydralazine IV as needed for blood pressure above 160 Adding Imdur 15. Severe B12  deficiency B12 injections given on 11/4 and on p.o. replacement-we will need q. weekly dosing of IV therapy Hypothyroid Needs outpatient work-up OSA not on BiPAP Considering sleep study in the outpatient setting Poor visual acuity Outpatient work-up Orthostatic hypotension in the past   DVT prophylaxis: SCD  Code Status:   Full   Family Communication:   None Disposition Plan: Inpatient  Consultants:   Oncology Procedures:   CT Antimicrobials:   nojne  Subjective:  Awake alert coherent no distress, continues to be unclear about the plan of care and asks again seemingly unrelated questions Does not wish that I update her mother about her plan   Objective: Vitals:   11/10/18 0321 11/10/18 0440 11/10/18 0737 11/10/18 0819  BP: (!) 146/73 132/68 (!) 149/75 (!) 151/84  Pulse: 70 67 71 73  Resp: 16 18 16   Temp: 98.2 F (36.8 C) 98.2 F (36.8 C) 97.9 F (36.6 C) 98.3 F (36.8 C)  TempSrc: Oral Oral Oral Oral  SpO2: 97% 93% 100% 96%  Weight: 107.6 kg     Height:        Intake/Output Summary (Last 24 hours) at 11/10/2018 0903 Last data filed at 11/10/2018 0817 Gross per 24 hour  Intake 2252.31 ml  Output 4225 ml  Net -1972.69 ml   Filed Weights   11/08/18 0700 11/09/18 0442 11/10/18 0321  Weight: 107 kg 104.6 kg 107.6 kg    Examination:  Awake coherent no distress EOMI NCAT anxious Visually impaired S1-S2 no murmur Chest clinically clear Abdomen soft no rebound No lower extremity edema  Data Reviewed: I have personally reviewed following labs and imaging studies CBC: Recent Labs  Lab 11/06/18 0633 11/07/18 0412 11/08/18 0359 11/09/18 0450 11/10/18   0400  WBC 4.6 6.2 5.3 4.8 5.5  NEUTROABS 4.0 5.3 4.7 4.2 4.4  HGB 9.6* 10.1* 8.4* 8.6* 9.3*  HCT 30.0* 31.0* 26.0* 27.3* 29.2*  MCV 93.8 94.5 94.2 94.8 95.1  PLT 8* 29* 66* 128* 951   Basic Metabolic Panel: Recent Labs  Lab 11/06/18 0633 11/07/18 0412 11/08/18 0359 11/09/18 0450 11/10/18 0400   NA 135 133* 129* 128* 130*  K 4.7 4.7 4.8 4.4 3.8  CL 104 103 101 99 101  CO2 19* 19* 21* 22 24  GLUCOSE 369* 369* 453* 400* 195*  BUN 39* 43* 62* 70* 60*  CREATININE 1.83* 1.63* 1.64* 1.61* 1.38*  CALCIUM 9.2 9.5 8.3* 8.5* 8.3*   GFR: Estimated Creatinine Clearance: 59.8 mL/min (A) (by C-G formula based on SCr of 1.38 mg/dL (H)). Liver Function Tests: Recent Labs  Lab 11/05/18 0001 11/05/18 0624 11/09/18 0450  AST 37 31 26  ALT 36 34 27  ALKPHOS 68 69 49  BILITOT 0.6 0.6 0.2*  PROT 7.6 6.9 9.0*  ALBUMIN 3.8 3.8 2.5*   No results for input(s): LIPASE, AMYLASE in the last 168 hours. No results for input(s): AMMONIA in the last 168 hours. Coagulation Profile: Recent Labs  Lab 11/05/18 0001  INR 1.2   Cardiac Enzymes: Radiology Studies: Reviewed images personally in health database  Scheduled Meds: . sodium chloride   Intravenous Once  . acetaminophen  650 mg Oral Once  . amLODipine  10 mg Oral Daily  . calcium carbonate  2 tablet Oral TID  . Chlorhexidine Gluconate Cloth  6 each Topical Daily  . cholecalciferol  5,000 Units Oral Daily  . cyanocobalamin  1,000 mcg Subcutaneous Daily  . gabapentin  400 mg Oral TID  . insulin aspart  0-20 Units Subcutaneous TID WC  . insulin aspart  0-5 Units Subcutaneous QHS  . insulin glargine  30 Units Subcutaneous Daily   Continuous Infusions: . sodium chloride 50 mL/hr at 11/10/18 0817    LOS: 5 days   Time spent:  Verneita Griffes, MD [facility]  11/10/2018, 9:03 AM

## 2018-11-11 ENCOUNTER — Inpatient Hospital Stay (HOSPITAL_COMMUNITY): Payer: Self-pay

## 2018-11-11 LAB — RENAL FUNCTION PANEL
Albumin: 2.3 g/dL — ABNORMAL LOW (ref 3.5–5.0)
Anion gap: 5 (ref 5–15)
BUN: 65 mg/dL — ABNORMAL HIGH (ref 6–20)
CO2: 23 mmol/L (ref 22–32)
Calcium: 8.2 mg/dL — ABNORMAL LOW (ref 8.9–10.3)
Chloride: 104 mmol/L (ref 98–111)
Creatinine, Ser: 1.76 mg/dL — ABNORMAL HIGH (ref 0.44–1.00)
GFR calc Af Amer: 38 mL/min — ABNORMAL LOW (ref >60)
GFR calc non Af Amer: 33 mL/min — ABNORMAL LOW (ref >60)
Glucose, Bld: 105 mg/dL — ABNORMAL HIGH (ref 70–99)
Phosphorus: 4 mg/dL (ref 2.5–4.6)
Potassium: 4.4 mmol/L (ref 3.5–5.1)
Sodium: 132 mmol/L — ABNORMAL LOW (ref 135–145)

## 2018-11-11 LAB — CBC WITH DIFFERENTIAL/PLATELET
Abs Immature Granulocytes: 0.03 10*3/uL (ref 0.00–0.07)
Basophils Absolute: 0 10*3/uL (ref 0.0–0.1)
Basophils Relative: 0 %
Eosinophils Absolute: 0 10*3/uL (ref 0.0–0.5)
Eosinophils Relative: 1 %
HCT: 26.2 % — ABNORMAL LOW (ref 36.0–46.0)
Hemoglobin: 8.4 g/dL — ABNORMAL LOW (ref 12.0–15.0)
Immature Granulocytes: 1 %
Lymphocytes Relative: 15 %
Lymphs Abs: 0.8 10*3/uL (ref 0.7–4.0)
MCH: 30.5 pg (ref 26.0–34.0)
MCHC: 32.1 g/dL (ref 30.0–36.0)
MCV: 95.3 fL (ref 80.0–100.0)
Monocytes Absolute: 0.9 10*3/uL (ref 0.1–1.0)
Monocytes Relative: 17 %
Neutro Abs: 3.5 10*3/uL (ref 1.7–7.7)
Neutrophils Relative %: 66 %
Platelets: 245 10*3/uL (ref 150–400)
RBC: 2.75 MIL/uL — ABNORMAL LOW (ref 3.87–5.11)
RDW: 14.7 % (ref 11.5–15.5)
WBC: 5.3 10*3/uL (ref 4.0–10.5)
nRBC: 0 % (ref 0.0–0.2)

## 2018-11-11 LAB — CREATININE, URINE, RANDOM: Creatinine, Urine: 135.45 mg/dL

## 2018-11-11 LAB — GLUCOSE, CAPILLARY
Glucose-Capillary: 179 mg/dL — ABNORMAL HIGH (ref 70–99)
Glucose-Capillary: 112 mg/dL — ABNORMAL HIGH (ref 70–99)
Glucose-Capillary: 90 mg/dL (ref 70–99)
Glucose-Capillary: 162 mg/dL — ABNORMAL HIGH (ref 70–99)

## 2018-11-11 LAB — SODIUM, URINE, RANDOM: Sodium, Ur: <10 mmol/L

## 2018-11-11 NOTE — Progress Notes (Signed)
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  Prior admission 11/2015 orthostatic hypotension, polypharmacy, Thrombocytopenia-bone marrow biopsy 08/25/2015 hypercellular marrow with no evidence of malignancy Admit to [facility] 11/05/2018 dysuria hematuria-work-up showed guaiac positive school + hematuria platelets only 5000-transfused 2 units of platelets and was admitted Oncology saw the patient felt that she had an acute exacerbation of chronic ITP started on dexamethasone and then eventually started on IVIG   Assessment & Plan: Acute exacerbation of ITP Resolved status post 2 days IVIG, Decadron  AKI superimposed on CKD stage IIIb Hematuria, hyponatremia Did not receive IV saline as reported--RN told me this 11/5 Continue saline 50 cc/h-creatinine improved slightly but because of worsening today I feel she might have cardiorenal syndrome as we tried to control her blood pressure with Imdur yesterday and her creatinine worsened in that setting to 65/1.7 Obtain urine sodium urine creatinine renal ultrasound and if any of these are abnormal will speak with nephrology regarding further management DM TY 2 with neuropathy CBG ranging 1 79-2 08 continue 22 units of Lantus and sliding scale HTN Continue amlodipine 10 and hydralazine IV as I have discontinued in view Severe B12 deficiency B12 injections given on 11/4 and on p.o. replacement-we will need q. weekly dosing of IV therapy Hypothyroid Needs outpatient work-up OSA not on  BiPAP Considering sleep study in the outpatient setting Poor visual acuity Outpatient work-up She tells me that her vision is a little worse today and says that she has cataracts but does not know who the doctor is This could be because of steroids we will monitor Orthostatic hypotension in the past   DVT prophylaxis: SCD  Code Status:   Full   Family Communication:   None Disposition Plan: Inpatient  Consultants:   Oncology Procedures:   CT Antimicrobials:   none  Subjective:  Some worsening of vision today States that this has been going on for the past week and she let me know about this only today We discussed that this could be possibly secondary to steroids but that as this does not seem emergent she will need outpatient management with her cataract surgery   Objective: Vitals:   11/10/18 1103 11/10/18 2159 11/11/18 0349 11/11/18 0801  BP: (!) 156/74 (!) 111/57 119/61 (!) 112/58  Pulse: 73 80 74 73  Resp: _0 Temp: 98.2 F (36.8 C) 98.2 F (36.8 C) 99.1 F (37.3 C)   TempSrc: Oral Oral Oral   SpO2: 98% 99% 94% 93%  Weight:   105.9 kg   Height:        Intake/Output Summary (Last 24 hours) at 11/11/2018 1638 Last data filed at 11/11/2018 0900 Gross per 24 hour  Intake 2032.57 ml  Output 2910 ml  Net -877.43 ml   Filed Weights   11/09/18 0442 11/10/18 0321 11/11/18 0349  Weight: 104.6 kg 107.6 kg 105.9 kg    Examination:  Coherent pleasant but has some tangentiality difficult to keep on track S1-S2 no murmur and without Chest clear No lower extremity edema Foley catheter in  place Abdomen soft Neurologically intact moving all 4 limbs  Data Reviewed: I have personally reviewed following labs and imaging studies CBC: Recent Labs  Lab 11/07/18 0412 11/08/18 0359 11/09/18 0450 11/10/18 0400 11/11/18 0350  WBC 6.2 5.3 4.8 5.5 5.3  NEUTROABS 5.3 4.7 4.2 4.4 3.5  HGB 10.1* 8.4* 8.6* 9.3* 8.4*  HCT 31.0* 26.0* 27.3* 29.2* 26.2*  MCV 94.5  94.2 94.8 95.1 95.3  PLT 29* 66* 128* 217 037   Basic Metabolic Panel: Recent Labs  Lab 11/07/18 0412 11/08/18 0359 11/09/18 0450 11/10/18 0400 11/11/18 0350  NA 133* 129* 128* 130* 132*  K 4.7 4.8 4.4 3.8 4.4  CL 103 101 99 101 104  CO2 19* 21* _0 GLUCOSE 369* 453* 400* 195* 105*  BUN 43* 62* 70* 60* 65*  CREATININE 1.63* 1.64* 1.61* 1.38* 1.76*  CALCIUM 9.5 8.3* 8.5* 8.3* 8.2*  PHOS  --   --   --   --  4.0   GFR: Estimated Creatinine Clearance: 46 mL/min (A) (by C-G formula based on SCr of 1.76 mg/dL (H)). Liver Function Tests: Recent Labs  Lab 11/05/18 0001 11/05/18 0624 11/09/18 0450 11/11/18 0350  AST 37 31 26  --   ALT 36 34 27  --   ALKPHOS 68 69 49  --   BILITOT 0.6 0.6 0.2*  --   PROT 7.6 6.9 9.0*  --   ALBUMIN 3.8 3.8 2.5* 2.3*   No results for input(s): LIPASE, AMYLASE in the last 168 hours. No results for input(s): AMMONIA in the last 168 hours. Coagulation Profile: Recent Labs  Lab 11/05/18 0001  INR 1.2   Cardiac Enzymes: Radiology Studies: Reviewed images personally in health database  Scheduled Meds: . sodium chloride   Intravenous Once  . acetaminophen  650 mg Oral Once  . amLODipine  10 mg Oral Daily  . calcium carbonate  2 tablet Oral TID  . Chlorhexidine Gluconate Cloth  6 each Topical Daily  . cholecalciferol  5,000 Units Oral Daily  . gabapentin  400 mg Oral TID  . insulin aspart  0-20 Units Subcutaneous TID WC  . insulin aspart  0-5 Units Subcutaneous QHS  . insulin glargine  22 Units Subcutaneous Daily  . tamsulosin  0.4 mg Oral Daily   Continuous Infusions: . sodium chloride 50 mL/hr at 11/11/18 0815    LOS: 6 days   Time spent:  Verneita Griffes, MD [facility]  11/11/2018, 9:07 AM

## 2018-11-11 NOTE — Progress Notes (Signed)
Patient midline dressing is coming loose.  No floor stock kits to change dressing.  IV team consult placed.

## 2018-11-12 LAB — CBC WITH DIFFERENTIAL/PLATELET
Abs Immature Granulocytes: 0.03 10*3/uL (ref 0.00–0.07)
Basophils Absolute: 0 10*3/uL (ref 0.0–0.1)
Basophils Relative: 0 %
Eosinophils Absolute: 0.1 10*3/uL (ref 0.0–0.5)
Eosinophils Relative: 2 %
HCT: 27.6 % — ABNORMAL LOW (ref 36.0–46.0)
Hemoglobin: 8.6 g/dL — ABNORMAL LOW (ref 12.0–15.0)
Immature Granulocytes: 1 %
Lymphocytes Relative: 15 %
Lymphs Abs: 0.9 10*3/uL (ref 0.7–4.0)
MCH: 30.4 pg (ref 26.0–34.0)
MCHC: 31.2 g/dL (ref 30.0–36.0)
MCV: 97.5 fL (ref 80.0–100.0)
Monocytes Absolute: 0.8 10*3/uL (ref 0.1–1.0)
Monocytes Relative: 14 %
Neutro Abs: 4.1 10*3/uL (ref 1.7–7.7)
Neutrophils Relative %: 68 %
Platelets: 283 10*3/uL (ref 150–400)
RBC: 2.83 MIL/uL — ABNORMAL LOW (ref 3.87–5.11)
RDW: 14.9 % (ref 11.5–15.5)
WBC: 6 10*3/uL (ref 4.0–10.5)
nRBC: 0 % (ref 0.0–0.2)

## 2018-11-12 LAB — RENAL FUNCTION PANEL
Albumin: 2.4 g/dL — ABNORMAL LOW (ref 3.5–5.0)
Anion gap: 8 (ref 5–15)
BUN: 53 mg/dL — ABNORMAL HIGH (ref 6–20)
CO2: 24 mmol/L (ref 22–32)
Calcium: 8.1 mg/dL — ABNORMAL LOW (ref 8.9–10.3)
Chloride: 105 mmol/L (ref 98–111)
Creatinine, Ser: 1.59 mg/dL — ABNORMAL HIGH (ref 0.44–1.00)
GFR calc Af Amer: 43 mL/min — ABNORMAL LOW (ref >60)
GFR calc non Af Amer: 37 mL/min — ABNORMAL LOW (ref >60)
Glucose, Bld: 101 mg/dL — ABNORMAL HIGH (ref 70–99)
Phosphorus: 4.2 mg/dL (ref 2.5–4.6)
Potassium: 4.5 mmol/L (ref 3.5–5.1)
Sodium: 137 mmol/L (ref 135–145)

## 2018-11-12 LAB — GLUCOSE, CAPILLARY
Glucose-Capillary: 62 mg/dL — ABNORMAL LOW (ref 70–99)
Glucose-Capillary: 98 mg/dL (ref 70–99)
Glucose-Capillary: 89 mg/dL (ref 70–99)
Glucose-Capillary: 121 mg/dL — ABNORMAL HIGH (ref 70–99)
Glucose-Capillary: 134 mg/dL — ABNORMAL HIGH (ref 70–99)

## 2018-11-12 MED ORDER — INSULIN GLARGINE 100 UNIT/ML ~~LOC~~ SOLN
16.0000 [IU] | Freq: Every day | SUBCUTANEOUS | Status: DC
  Administered 2018-11-13: 16 [IU] via SUBCUTANEOUS
  Filled 2018-11-12: qty 0.16

## 2018-11-12 NOTE — Progress Notes (Signed)
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  Prior admission 11/2015 orthostatic hypotension, polypharmacy, Thrombocytopenia-bone marrow biopsy 08/25/2015 hypercellular marrow with no evidence of malignancy Admit to [facility] 11/05/2018 dysuria hematuria-work-up showed guaiac positive school + hematuria platelets only 5000-transfused 2 units of platelets and was admitted Oncology saw the patient felt that she had an acute exacerbation of chronic ITP started on dexamethasone and then eventually started on IVIG   Assessment & Plan: Acute exacerbation of ITP Resolved status post 2 days IVIG, Decadron-oncology s/o  AKI superimposed on CKD stage IIIb Hematuria, hyponatremia Did not receive IV saline as reported--RN told me this 11/5 continue IV saline 100/h today and saline lock am if renal status improves FeNa was indicative of pre-renal causes and Creatinin improved US renal was neg 11.8 DM TY 2 with neuropathy-hypoglycemic episode 11/8  Monitor trends-now eating 100% meals  Cut back lantus from 22 to 16 U and resistant SSI D/c Hs coverage tonight HTN Continue amlodipine 10 and hydralazine IV as I have discontinued imdur d/c Severe B12 deficiency B12 injections given on 11/4 and on p.o. replacement-we will need q. weekly dosing of IV therapy Hypothyroid Needs outpatient work-up OSA not on BiPAP Considering sleep study in the outpatient setting Poor visual acuity Outpatient work-up She tells me that her vision is a little worse  today and says that she has cataracts but does not know who the doctor is This could be because of steroids we will monitor Orthostatic hypotension in the past   DVT prophylaxis: SCD  Code Status:   Full   Family Communication:   None Disposition Plan: Inpatient  Consultants:   Oncology Procedures:   CT Antimicrobials:   none  Subjective:  Overall fair Asking if she needs foley still No new issues No cp no fever passing good urine  Objective: Vitals:   11/11/18 1933 11/12/18 0423 11/12/18 0948 11/12/18 1242  BP: 137/87 (!) 141/64 130/61 139/60  Pulse: 82 79 83 81  Resp: '18 18 18 19  ' Temp: 99 F (37.2 C) 99.8 F (37.7 C) 99.4 F (37.4 C) 98.9 F (37.2 C)  TempSrc: Oral Oral Oral Oral  SpO2: 99% 97% 94% 95%  Weight:  103.9 kg    Height:        Intake/Output Summary (Last 24 hours) at 11/12/2018 1350 Last data filed at 11/12/2018 0950 Gross per 24 hour  Intake 1182 ml  Output 3700 ml  Net -2518 ml   Filed Weights   11/10/18 0321 11/11/18 0349 11/12/18 0423  Weight: 107.6 kg 105.9 kg 103.9 kg    Examination:  Coherent pleasant tangential at times S1-S2 no murmur and without Chest clear no rales rhonchi No lower extremity edema Foley catheter in place Abdomen soft Neurologically intact moving all 4 limbs  Data Reviewed: I have personally reviewed following labs and imaging studies CBC: Recent Labs  Lab 11/08/18 0359 11/09/18 0450 11/10/18 0400 11/11/18 0350 11/12/18 1100  WBC 5.3 4.8 5.5 5.3 6.0  NEUTROABS 4.7 4.2 4.4 3.5 4.1  HGB 8.4* 8.6* 9.3* 8.4* 8.6*  HCT 26.0* 27.3* 29.2* 26.2* 27.6*  MCV 94.2 94.8 95.1 95.3 97.5  PLT 66* 128* 217 245 629   Basic Metabolic Panel: Recent Labs  Lab 11/08/18 0359 11/09/18 0450 11/10/18 0400 11/11/18 0350 11/12/18 1100  NA 129* 128* 130* 132* 137  K 4.8 4.4 3.8 4.4 4.5  CL 101 99 101 104 105  CO2 21* '22 24 23 24  ' GLUCOSE 453* 400* 195* 105* 101*  BUN 62* 70* 60* 65* 53*  CREATININE 1.64* 1.61*  1.38* 1.76* 1.59*  CALCIUM 8.3* 8.5* 8.3* 8.2* 8.1*  PHOS  --   --   --  4.0 4.2   GFR: Estimated Creatinine Clearance: 50.4 mL/min (A) (by C-G formula based on SCr of 1.59 mg/dL (H)). Liver Function Tests: Recent Labs  Lab 11/09/18 0450 11/11/18 0350 11/12/18 1100  AST 26  --   --   ALT 27  --   --   ALKPHOS 49  --   --   BILITOT 0.2*  --   --   PROT 9.0*  --   --   ALBUMIN 2.5* 2.3* 2.4*   No results for input(s): LIPASE, AMYLASE in the last 168 hours. No results for input(s): AMMONIA in the last 168 hours. Coagulation Profile: No results for input(s): INR, PROTIME in the last 168 hours. Cardiac Enzymes: Radiology Studies: Reviewed images personally in health database  Scheduled Meds: . sodium chloride   Intravenous Once  . acetaminophen  650 mg Oral Once  . amLODipine  10 mg Oral Daily  . calcium carbonate  2 tablet Oral TID  . Chlorhexidine Gluconate Cloth  6 each Topical Daily  . cholecalciferol  5,000 Units Oral Daily  . gabapentin  400 mg Oral TID  . insulin aspart  0-20 Units Subcutaneous TID WC  . insulin aspart  0-5 Units Subcutaneous QHS  . insulin glargine  22 Units Subcutaneous Daily  . tamsulosin  0.4 mg Oral Daily   Continuous Infusions: . sodium chloride 100 mL/hr at 11/12/18 1008    LOS: 7 days   Time spent:  Verneita Griffes, MD [facility]  11/12/2018, 1:50 PM

## 2018-11-12 NOTE — Plan of Care (Signed)
  Problem: Education: Goal: Knowledge of General Education information will improve Description: Including pain rating scale, medication(s)/side effects and non-pharmacologic comfort measures 11/12/2018 1136 by Beckey Rutter, RN Outcome: Progressing 11/12/2018 1136 by Beckey Rutter, RN Outcome: Progressing   Problem: Health Behavior/Discharge Planning: Goal: Ability to manage health-related needs will improve 11/12/2018 1136 by Beckey Rutter, RN Outcome: Progressing 11/12/2018 1136 by Beckey Rutter, RN Outcome: Progressing   Problem: Clinical Measurements: Goal: Ability to maintain clinical measurements within normal limits will improve 11/12/2018 1136 by Beckey Rutter, RN Outcome: Progressing 11/12/2018 1136 by Beckey Rutter, RN Outcome: Progressing Goal: Will remain free from infection 11/12/2018 1136 by Beckey Rutter, RN Outcome: Progressing 11/12/2018 1136 by Beckey Rutter, RN Outcome: Progressing Goal: Diagnostic test results will improve 11/12/2018 1136 by Beckey Rutter, RN Outcome: Progressing 11/12/2018 1136 by Beckey Rutter, RN Outcome: Progressing Goal: Respiratory complications will improve 11/12/2018 1136 by Beckey Rutter, RN Outcome: Progressing 11/12/2018 1136 by Beckey Rutter, RN Outcome: Progressing Goal: Cardiovascular complication will be avoided 11/12/2018 1136 by Beckey Rutter, RN Outcome: Progressing 11/12/2018 1136 by Beckey Rutter, RN Outcome: Progressing   Problem: Activity: Goal: Risk for activity intolerance will decrease 11/12/2018 1136 by Beckey Rutter, RN Outcome: Progressing 11/12/2018 1136 by Beckey Rutter, RN Outcome: Progressing   Problem: Nutrition: Goal: Adequate nutrition will be maintained 11/12/2018 1136 by Beckey Rutter, RN Outcome: Progressing 11/12/2018 1136 by Beckey Rutter, RN Outcome: Progressing   Problem: Coping: Goal: Level of anxiety will decrease 11/12/2018 1136 by Beckey Rutter, RN Outcome: Progressing 11/12/2018 1136 by Beckey Rutter, RN Outcome: Progressing    Problem: Elimination: Goal: Will not experience complications related to bowel motility 11/12/2018 1136 by Beckey Rutter, RN Outcome: Progressing 11/12/2018 1136 by Beckey Rutter, RN Outcome: Progressing Goal: Will not experience complications related to urinary retention 11/12/2018 1136 by Beckey Rutter, RN Outcome: Progressing 11/12/2018 1136 by Beckey Rutter, RN Outcome: Progressing   Problem: Pain Managment: Goal: General experience of comfort will improve 11/12/2018 1136 by Beckey Rutter, RN Outcome: Progressing 11/12/2018 1136 by Beckey Rutter, RN Outcome: Progressing   Problem: Safety: Goal: Ability to remain free from injury will improve 11/12/2018 1136 by Beckey Rutter, RN Outcome: Progressing 11/12/2018 1136 by Beckey Rutter, RN Outcome: Progressing   Problem: Skin Integrity: Goal: Risk for impaired skin integrity will decrease 11/12/2018 1136 by Beckey Rutter, RN Outcome: Progressing 11/12/2018 1136 by Beckey Rutter, RN Outcome: Progressing

## 2018-11-12 NOTE — Progress Notes (Signed)
Pt CBG 62.  Hypoglycemic Event  CBG: 62  Treatment: 4 oz Orange Juice given to patient.  Symptoms:Pt denies symptoms Just woke up when staff entered the room.  Follow-up CBG: [id number] CBG Result:98  Possible Reasons for Event: Has not been eating as she normally does.   Comments/MD notified Provider notified.    Levy Pupa Smith-Fischer

## 2018-11-13 LAB — CBC WITH DIFFERENTIAL/PLATELET
Abs Immature Granulocytes: 0.01 10*3/uL (ref 0.00–0.07)
Basophils Absolute: 0 10*3/uL (ref 0.0–0.1)
Basophils Relative: 0 %
Eosinophils Absolute: 0.2 10*3/uL (ref 0.0–0.5)
Eosinophils Relative: 4 %
HCT: 28.8 % — ABNORMAL LOW (ref 36.0–46.0)
Hemoglobin: 9.1 g/dL — ABNORMAL LOW (ref 12.0–15.0)
Immature Granulocytes: 0 %
Lymphocytes Relative: 17 %
Lymphs Abs: 0.9 10*3/uL (ref 0.7–4.0)
MCH: 29.9 pg (ref 26.0–34.0)
MCHC: 31.6 g/dL (ref 30.0–36.0)
MCV: 94.7 fL (ref 80.0–100.0)
Monocytes Absolute: 0.5 10*3/uL (ref 0.1–1.0)
Monocytes Relative: 10 %
Neutro Abs: 3.5 10*3/uL (ref 1.7–7.7)
Neutrophils Relative %: 69 %
Platelets: 277 10*3/uL (ref 150–400)
RBC: 3.04 MIL/uL — ABNORMAL LOW (ref 3.87–5.11)
RDW: 14.9 % (ref 11.5–15.5)
WBC: 5.2 10*3/uL (ref 4.0–10.5)
nRBC: 0 % (ref 0.0–0.2)

## 2018-11-13 LAB — RENAL FUNCTION PANEL
Albumin: 2.4 g/dL — ABNORMAL LOW (ref 3.5–5.0)
Anion gap: 6 (ref 5–15)
BUN: 43 mg/dL — ABNORMAL HIGH (ref 6–20)
CO2: 22 mmol/L (ref 22–32)
Calcium: 8.5 mg/dL — ABNORMAL LOW (ref 8.9–10.3)
Chloride: 110 mmol/L (ref 98–111)
Creatinine, Ser: 1.3 mg/dL — ABNORMAL HIGH (ref 0.44–1.00)
GFR calc Af Amer: 55 mL/min — ABNORMAL LOW (ref >60)
GFR calc non Af Amer: 47 mL/min — ABNORMAL LOW (ref >60)
Glucose, Bld: 119 mg/dL — ABNORMAL HIGH (ref 70–99)
Phosphorus: 3.6 mg/dL (ref 2.5–4.6)
Potassium: 4.3 mmol/L (ref 3.5–5.1)
Sodium: 138 mmol/L (ref 135–145)

## 2018-11-13 LAB — GLUCOSE, CAPILLARY
Glucose-Capillary: 80 mg/dL (ref 70–99)
Glucose-Capillary: 169 mg/dL — ABNORMAL HIGH (ref 70–99)
Glucose-Capillary: 140 mg/dL — ABNORMAL HIGH (ref 70–99)

## 2018-11-13 MED ORDER — METFORMIN HCL 500 MG PO TABS: 500.0000 mg | ORAL_TABLET | Freq: Two times a day (BID) | ORAL | 1 refills | Status: DC

## 2018-11-13 MED ORDER — TAMSULOSIN HCL 0.4 MG PO CAPS: 0.4000 mg | ORAL_CAPSULE | Freq: Every day | ORAL | 3 refills | Status: DC

## 2018-11-13 MED ORDER — INSULIN GLARGINE 100 UNIT/ML ~~LOC~~ SOLN: 16.0000 [IU] | Freq: Every day | SUBCUTANEOUS | 3 refills | Status: DC

## 2018-11-13 MED ORDER — AMLODIPINE BESYLATE 10 MG PO TABS: 10.0000 mg | ORAL_TABLET | Freq: Every day | ORAL | 1 refills | Status: DC

## 2018-11-13 NOTE — Discharge Summary (Addendum)
Physician Discharge Summary  [name] IOX:[id number] DOB: Jul 03, 1965 DOA: 11/04/2018  PCP: Patient, No Pcp Per  Admit date: 11/04/2018 Discharge date: 11/13/2018  Time spent: 45 minutes  Recommendations for Outpatient Follow-up:  1. Keep scheduled appointment with urologist for Foley catheter 2. Needs A1c in 3 months-A1c this admission was 6 3. We will need CBC renal panel about 1 week as an outpatient 4. Will order wheelchair size 18 for patient 5. Will probably need outpatient weekly dosing of B12 IV if not able to arrange because of debility wheelchair status and inability to get to appointments we will probably benefit from multivitamin 6. Please consider outpatient sleep study she has never been diagnosed with sleep apnea 7. Needs coordination of her cataract care and visual issues   Discharge Diagnoses:  Principal Problem:   Thrombocytopenia ([place]) Active Problems:   Essential hypertension   Diabetes mellitus ([place])   Occult blood positive stool   Benign essential HTN   CKD (chronic kidney disease) stage 3, GFR 30-59 ml/min   Discharge Condition: Fair  Diet recommendation: Diabetic heart healthy  Filed Weights   11/11/18 0349 11/12/18 0423 11/13/18 0421  Weight: 105.9 kg 103.9 kg 108.7 kg    History of present illness:  53 y/o AAF ICH status post left craniotomy DM TY 2,  CKD 3, right BKA 2/2 DM foot ulcer 07/2011 neuropathy with prior cellulitis 01/30/2018,  HTN,  cataracts with poor visual acuity , hypothyroid?,   OSA not on CPAP,  prior chronic Foley catheter for retention , multiple decubitus ulcers in the past and.    Prior admission 11/2015 orthostatic hypotension, polypharmacy, Thrombocytopenia-bone marrow biopsy 08/25/2015 hypercellular marrow with no evidence of malignancy  Admit to [facility] 11/05/2018 dysuria hematuria-work-up showed guaiac positive school + hematuria platelets only 5000-transfused 2 units of platelets and was  admitted Oncology saw the patient felt that she had an acute exacerbation of chronic ITP started on dexamethasone and then eventually started on IVIG  Hospital Course:  Acute exacerbation of ITP Resolved status post 2 days IVIG, Decadron-oncology s/o  AKI superimposed on CKD stage IIIb Hematuria, hyponatremia FeNa was indicative of pre-renal causes and Creatinin improved US renal was neg 11.8 She also had retention of urine >600 cc despite passing small amount and Foley catheter was placed , placed on Flomax 0.4 Appointment with urologist to set up in the outpatient setting as per [facility]  DM TY 2 with neuropathy-hypoglycemic episode 11/8               Monitor trends-now eating 100% meals   Metformin on discharge   Cannot dial up dose of lantus by self--held Lantus  HTN Continue amlodipine 10 and hydralazine IV as I have discontinued imdur d/c Severe B12 deficiency B12 injections given on 11/4 and on p.o. replacement considering outpatient q. weekly dosing Hypothyroid Needs outpatient work-up OSA not on BiPAP Considering sleep study in the outpatient setting Poor visual acuity Outpatient work-up She tells me that her vision is a little worse today and says that she has cataracts but does not know who the doctor is This could be because of steroids needs outpatient follow-up   Procedures: Multiple  Consultations:  Oncology  Discharge Exam: Vitals:   11/13/18 0421 11/13/18 1030  BP: (!) 125/57 (!) 152/57  Pulse: 69 81  Resp: 18 20  Temp: 98.3 F (36.8 C) 97.7 F (36.5 C)  SpO2: 97% 100%    General: EOMI however poor visual acuity Neck thick soft  supple Abdomen obese nontender no rebound Right BKA left lower extremities not swollen Abdomen soft Foley in place Chest clear Neurologically intact but sometimes tangential and seems confused  Discharge Instructions   Discharge Instructions    Diet - low sodium heart healthy   Complete by: As directed    Discharge  instructions   Complete by: As directed    You will need to keep your Foley catheter in place until you can follow-up in the outpatient setting with urologist we have set up an appointment as below on your paperwork with [facility] please follow-up with them-we have also started you this admission on Flomax which may help with urinary flow  We will order a wheelchair size 18 for you so that you can move around Please get labs in about 1 to 2 weeks make sure that you are able to check your blood sugars periodically and you might need consideration for use of regular insulin in addition to the long-acting insulin that you have I would also recommend that you check your medications carefully as you have been placed on a higher dose of amlodipine which is a blood pressure medicine-Please report any bleeding in your urine or any other type of bleeding to your regular physician   Increase activity slowly   Complete by: As directed      Allergies as of 11/13/2018   No Known Allergies     Medication List    STOP taking these medications   terbinafine 1 % cream Commonly known as: LAMISIL     TAKE these medications   amLODipine 10 MG tablet Commonly known as: NORVASC Take 1 tablet (10 mg total) by mouth daily. Start taking on: November 14, 2018 What changed:   medication strength  how much to take   calcium carbonate 500 MG chewable tablet Commonly known as: TUMS - dosed in mg elemental calcium Chew 2 tablets (400 mg of elemental calcium total) by mouth 3 (three) times daily.   Cholecalciferol 125 MCG (5000 UT) Tabs Take 1 tablet (5,000 Units total) by mouth daily.   gabapentin 400 MG capsule Commonly known as: NEURONTIN Take 1 capsule (400 mg total) by mouth 3 (three) times daily. Take 400 mg by mouth two times a day, and an additional 400 mg as needed for nerve pain What changed: additional instructions   insulin glargine 100 UNIT/ML injection Commonly known as:  LANTUS Inject 0.16 mLs (16 Units total) into the skin daily. Start taking on: November 14, 2018   metFORMIN 500 MG tablet Commonly known as: Glucophage Take 1 tablet (500 mg total) by mouth 2 (two) times daily with a meal.   tamsulosin 0.4 MG Caps capsule Commonly known as: FLOMAX Take 1 capsule (0.4 mg total) by mouth daily. Start taking on: November 14, 2018            Durable Medical Equipment  (From admission, onward)         Start     Ordered   11/13/18 1113  For home use only DME standard manual wheelchair with seat cushion  (Wheelchairs)  Once    Comments: Patient suffers from weakenss and debility which impairs their ability to perform daily activities like bathing, dressing, feeding and grooming in the home.  A cane will not resolve issue with performing activities of daily living. A wheelchair will allow patient to safely perform daily activities. Patient can safely propel the wheelchair in the home or has a caregiver who can provide assistance.  Length of need Lifetime  She needs a size 18 WC. Accessories: elevating leg rests (ELRs), wheel locks, extensions and anti-tippers.   11/13/18 1127         No Known Allergies Follow-up Information    [facility] Follow up on 12/08/2018.   Why: 10:30 for hospital follow up Contact information: [place] [address] [phone]           The results of significant diagnostics from this hospitalization (including imaging, microbiology, ancillary and laboratory) are listed below for reference.    Significant Diagnostic Studies: Ct Head Wo Contrast  Result Date: 11/05/2018 CLINICAL DATA:  Thrombocytopenia EXAM: CT HEAD WITHOUT CONTRAST TECHNIQUE: Contiguous axial images were obtained from the base of the skull through the vertex without intravenous contrast. COMPARISON:  None. FINDINGS: Brain: There is no mass, hemorrhage or extra-axial collection. Left temporal lobe  encephalomalacia. The brain parenchyma is normal, without acute or chronic infarction. Vascular: No abnormal hyperdensity of the major intracranial arteries or dural venous sinuses. No intracranial atherosclerosis. Skull: Remote left pterional craniotomy. Sinuses/Orbits: No fluid levels or advanced mucosal thickening of the visualized paranasal sinuses. No mastoid or middle ear effusion. The orbits are normal. IMPRESSION: Left temporal lobe encephalomalacia. No acute intracranial abnormality. Electronically Signed   By: Ulyses Jarred M.D.   On: 11/05/2018 00:12   US Renal  Result Date: 11/11/2018 CLINICAL DATA:  Acute renal insufficiency. EXAM: RENAL / URINARY TRACT ULTRASOUND COMPLETE COMPARISON:  09/25/2015 FINDINGS: Right Kidney: Renal measurements: 9.8 x 4.0 x 4.6 cm = volume: 94 ML . Echogenicity within normal limits. No mass or hydronephrosis visualized. Left Kidney: Renal measurements: 9.2 x 4.6 x 4.1 cm = volume: 89 mL. Echogenicity within normal limits. No mass or hydronephrosis visualized. Bladder: Foley catheter is present within a decompressed bladder. Other: None. IMPRESSION: Normal size kidneys without hydronephrosis. Electronically Signed   By: Marin Olp M.D.   On: 11/11/2018 10:39    Microbiology: Recent Results (from the past 240 hour(s))  SARS CORONAVIRUS 2 (TAT 6-24 HRS) Nasopharyngeal Nasopharyngeal Swab     Status: None   Collection Time: 11/05/18 12:07 AM   Specimen: Nasopharyngeal Swab  Result Value Ref Range Status   SARS Coronavirus 2 NEGATIVE NEGATIVE Final    Comment: (NOTE) SARS-CoV-2 target nucleic acids are NOT DETECTED. The SARS-CoV-2 RNA is generally detectable in upper and lower respiratory specimens during the acute phase of infection. Negative results do not preclude SARS-CoV-2 infection, do not rule out co-infections with other pathogens, and should not be used as the sole basis for treatment or other patient management decisions. Negative results must be  combined with clinical observations, patient history, and epidemiological information. The expected result is Negative. Fact Sheet for Patients: SugarRoll.be Fact Sheet for Healthcare Providers: https://www.woods-mathews.com/ This test is not yet approved or cleared by the [place] FDA and  has been authorized for detection and/or diagnosis of SARS-CoV-2 by FDA under an Emergency Use Authorization (EUA). This EUA will remain  in effect (meaning this test can be used) for the duration of the COVID-19 declaration under Section 56 4(b)(1) of the Act, 21 U.S.C. section 360bbb-3(b)(1), unless the authorization is terminated or revoked sooner. Performed at [facility], [place] [address] [place], [place] [address]      Labs: Basic Metabolic Panel: Recent Labs  Lab 11/09/18 0450 11/10/18 0400 11/11/18 0350 11/12/18 1100 11/13/18 0918  NA 128* 130* 132* 137 138  K 4.4 3.8 4.4 4.5  4.3  CL 99 101 104 105 110  CO2 '22 24 23 24 22  ' GLUCOSE 400* 195* 105* 101* 119*  BUN 70* 60* 65* 53* 43*  CREATININE 1.61* 1.38* 1.76* 1.59* 1.30*  CALCIUM 8.5* 8.3* 8.2* 8.1* 8.5*  PHOS  --   --  4.0 4.2 3.6   Liver Function Tests: Recent Labs  Lab 11/09/18 0450 11/11/18 0350 11/12/18 1100 11/13/18 0918  AST 26  --   --   --   ALT 27  --   --   --   ALKPHOS 49  --   --   --   BILITOT 0.2*  --   --   --   PROT 9.0*  --   --   --   ALBUMIN 2.5* 2.3* 2.4* 2.4*   No results for input(s): LIPASE, AMYLASE in the last 168 hours. No results for input(s): AMMONIA in the last 168 hours. CBC: Recent Labs  Lab 11/09/18 0450 11/10/18 0400 11/11/18 0350 11/12/18 1100 11/13/18 0918  WBC 4.8 5.5 5.3 6.0 5.2  NEUTROABS 4.2 4.4 3.5 4.1 3.5  HGB 8.6* 9.3* 8.4* 8.6* 9.1*  HCT 27.3* 29.2* 26.2* 27.6* 28.8*  MCV 94.8 95.1 95.3 97.5 94.7  PLT 128* 217 245 283 277   Cardiac Enzymes: No results for input(s): CKTOTAL, CKMB, CKMBINDEX, TROPONINI in the  last 168 hours. BNP: BNP (last 3 results) No results for input(s): BNP in the last 8760 hours.  ProBNP (last 3 results) No results for input(s): PROBNP in the last 8760 hours.  CBG: Recent Labs  Lab 11/12/18 0614 11/12/18 1240 11/12/18 1710 11/12/18 2109 11/13/18 0553  GLUCAP 98 89 121* 134* 80       Signed:  Nita Sells MD   [facility] 11/13/2018, 11:27 AM

## 2018-11-13 NOTE — Progress Notes (Signed)
Pt has had increase in weight by 14kg since admission. Pt has had 5kg increase since yesterday.   Will inform provider.

## 2018-11-13 NOTE — TOC Transition Note (Signed)
Transition of Care [facility]) - CM/SW Discharge Note   Patient Details  Name: [name] MRN: [id number] Date of Birth: October 19, 1965  [facility] of [facility]) CM/SW Contact:  Zenon Mayo, RN Phone Number: 11/13/2018, 12:56 PM   Clinical Narrative:    Patient is for dc today, NCM assisted patient with Match Letter.  She also states she would like a w/chair and that she will pay if she needs to pay for it because she needs it, she states she would like to use the DME company that she has been using before which is [employer].  NCM made referral to [facility], she will check to see if they have a size 18 and call NCM back.  Patient states if they do not have one she will wait til they get one and to have them to deliver it to her home.  Patient states she has a w/chair at home but it is broke and this is why she needs a new one.  Awaiting call back from [facility] with [employer].  Final next level of care: Home/Self Care Barriers to Discharge: No Barriers Identified   Patient Goals and CMS Choice Patient states their goals for this hospitalization and ongoing recovery are:: get better CMS Medicare.gov Compare Post Acute Care list provided to:: Patient Choice offered to / list presented to : Patient  Discharge Placement                       Discharge Plan and Services                DME Arranged: Wheelchair manual DME Agency: [employer] Date DME Agency Contacted: 11/13/18 Time DME Agency Contacted: [id number] Representative spoke with at DME Agency: [place]: NA          Social Determinants of Health ([place]) Interventions     Readmission Risk Interventions Readmission Risk Prevention Plan 11/13/2018  Transportation Screening Complete  PCP or Specialist Appt within 5-7 Days Complete  Home Care Screening Complete  Medication Review (RN CM) Complete  Some recent data might be hidden

## 2018-11-13 NOTE — Plan of Care (Signed)

## 2018-11-16 ENCOUNTER — Inpatient Hospital Stay: Payer: Self-pay | Attending: Hematology and Oncology | Admitting: Hematology and Oncology

## 2018-11-16 ENCOUNTER — Inpatient Hospital Stay: Payer: Self-pay

## 2018-11-16 DIAGNOSIS — D693 Immune thrombocytopenic purpura: Secondary | ICD-10-CM | POA: Insufficient documentation

## 2018-11-16 NOTE — Assessment & Plan Note (Deleted)
Diagnosed during hospitalization 11/03/2020 11/13/2018 Treatment: Dexamethasone plus IVIG (given 11/06/2018) Lab review:  It appears that the ITP has resolved. Return to clinic in 6 months with labs and follow-up.  If labs are normal at that time then we do not have to see her for follow-up.

## 2018-11-20 ENCOUNTER — Encounter: Payer: Self-pay | Admitting: Internal Medicine

## 2018-11-27 ENCOUNTER — Ambulatory Visit: Payer: Self-pay | Admitting: Internal Medicine

## 2018-12-08 ENCOUNTER — Ambulatory Visit: Payer: Self-pay | Admitting: Internal Medicine

## 2018-12-15 ENCOUNTER — Ambulatory Visit (INDEPENDENT_AMBULATORY_CARE_PROVIDER_SITE_OTHER): Payer: Medicaid Other | Admitting: Internal Medicine

## 2018-12-15 ENCOUNTER — Other Ambulatory Visit: Payer: Self-pay

## 2018-12-15 ENCOUNTER — Encounter: Payer: Self-pay | Admitting: Internal Medicine

## 2018-12-15 VITALS — BP 142/88 | HR 70 | Resp 12

## 2018-12-15 DIAGNOSIS — R339 Retention of urine, unspecified: Secondary | ICD-10-CM

## 2018-12-15 DIAGNOSIS — Z23 Encounter for immunization: Secondary | ICD-10-CM

## 2018-12-15 DIAGNOSIS — I10 Essential (primary) hypertension: Secondary | ICD-10-CM

## 2018-12-15 DIAGNOSIS — D693 Immune thrombocytopenic purpura: Secondary | ICD-10-CM

## 2018-12-15 DIAGNOSIS — Z659 Problem related to unspecified psychosocial circumstances: Secondary | ICD-10-CM

## 2018-12-15 DIAGNOSIS — N189 Chronic kidney disease, unspecified: Secondary | ICD-10-CM

## 2018-12-15 DIAGNOSIS — E1159 Type 2 diabetes mellitus with other circulatory complications: Secondary | ICD-10-CM

## 2018-12-15 DIAGNOSIS — H543 Unqualified visual loss, both eyes: Secondary | ICD-10-CM

## 2018-12-15 NOTE — Progress Notes (Signed)
Subjective:    Patient ID: [name], female   DOB: 1965-05-21, 53 y.o.   MRN: [id number]   HPI   1.  Recent hospitalization for ITP exacerbation, which she apparently was not clearly aware of at her establish visit with [place] before the hospitalization. Lasts platelets 11/9 were 277 (discharge). She did not follow up with Heme/oncology in November.  2.  Urinary retention:  She has not followed up with urology for indwelling foley catheter, which is now leaking.  Appears the catheter was placed due to difficulties adequately emptying bladder during hospitalization.  Patient has difficulty describing indications. Looking back at chart, appears to have intermittently had difficulties with this.  3.  Hypertension:  Pt. cannot see her meds, so stopped taking.  Later, states she is not taking the Amlodipine as she has had trouble with low blood pressure years ago, so stopped taking bp meds. Then states she can read the bottle and does so here in the office.   4.  DM:  Did not fill her Lantus as cannot see to get an appropriate dose. Taking Metformin twice daily. Does not check sugars as does not have a meter. She has not applied for orange card.   Did not get influenza nor pneumococcal 23 in hospital--appears did not give prior to discharge as ordered. Takes Gabapentin 400 mg twice daily as a pharmacist told her to just take twice daily.  5.  Social issues:  She did get set up with [facility].  She is not sure she applied for Medicaid  She was referred to [employer] to obtain Disability and medical coverage as well so we can get her set up with services.   Cannot get her to focus.   Working with ?[name] at [employer].  [phone] She is not clear where things are with application. She needs significant support and will need to check with SW regarding applications for that support from previous visit. Does not have a caregiver needed for a PACE referral. Cannot get her into eye specialist  as has no coverage and has not applied for orange card. Did get a new wheelchair.   Current Meds  Medication Sig  . gabapentin (NEURONTIN) 400 MG capsule Take 1 capsule (400 mg total) by mouth 3 (three) times daily. Take 400 mg by mouth two times a day, and an additional 400 mg as needed for nerve pain (Patient taking differently: Take 400 mg by mouth 3 (three) times daily. )  . metFORMIN (GLUCOPHAGE) 500 MG tablet Take 1 tablet (500 mg total) by mouth 2 (two) times daily with a meal.  . tamsulosin (FLOMAX) 0.4 MG CAPS capsule Take 1 capsule (0.4 mg total) by mouth daily.   No Known Allergies   Review of Systems    Objective:   BP (!) 142/88 (BP Location: Left Arm, Patient Position: Sitting, Cuff Size: Normal)   Pulse 70   Resp 12   LMP  (LMP Unknown)   Physical Exam  NAD HEENT:  Cataracts Lungs:  CTA CV:  RRR without murmur or rub.  Radial pulses normal and equal. No edema of left leg.  Right BKA stump with good coloration Abd: S, NT, No HSM or mass, + BS    Assessment & Plan  1.  ITP:  Difficulty finding if she ever followed up with Heme/Onc in 2017 when platelet count was first noted as a bit low during hospitalizations.  Recheck CBC today.   Need to figure out  how to get her care, but she does not follow through or share information easily. With her poorly controlled DM and cataracts, a very poor candidate for Corticosteroid treatment in the meantime if her platelets drop again.   2.  Urinary retention:  Spoke with [name] at [facility]. [name] is a patient of Dr. Louis Meckel. She has a history of urinary retention with chronic indwelling foley cath.   Last seen 04/2017. Missed an appt Nov. 16th after hospitalization She did have full Medicaid previously, but only family planning now. She would have to pay $250 to be seen [facility], which would be inexpensive is full through end of December. Will have her return Monday or Tuesday of next week to  remove the foley cath and see if she can go without. Calling [facility] to see if can set up transport. To continue Tamsulosin.  3.  Hypertension:  Urged her to take her Amlodipine.  4.  DM:  Encouraged healthy diet and taking her Metformin without fail.  Until she gets coverage so we can send for cataract surgery or she obtains coverage for home health to help with insulin dosing, otherwise limited. Until we find out what is happening with Legal Aid and disability, asked her to finish an orange card app.  5.  Transportation:  She has been set up with [employer], but is still calling a cab and paying.  Unable to get a clear answer as to why.  Lot of circular speaking.  6.  Right BKA :  Reportedly has a prosthesis at home but has never used as she states she lost coverage before received PT/OT.   Tried to get her to [facility] clinic, but suspect that will not happen.  Again, she does have [employer].  7.  Poor compliance:  Until we get someone in her home, I do not think we will find out why she is not following through with care.  8.  CKD:  CMP  9.  HM:  Pneumococcal 23 v today.  Asked her to obtain influenza at a free flu clinic.  10  Cataracts:  As under DM.

## 2018-12-16 LAB — COMPREHENSIVE METABOLIC PANEL
ALT: 19 IU/L (ref 0–32)
AST: 35 IU/L (ref 0–40)
Albumin/Globulin Ratio: 1.3 (ref 1.2–2.2)
Albumin: 4 g/dL (ref 3.8–4.9)
Alkaline Phosphatase: 90 IU/L (ref 39–117)
BUN/Creatinine Ratio: 15 (ref 9–23)
BUN: 18 mg/dL (ref 6–24)
Bilirubin Total: 0.2 mg/dL (ref 0.0–1.2)
CO2: 18 mmol/L — ABNORMAL LOW (ref 20–29)
Calcium: 8.7 mg/dL (ref 8.7–10.2)
Chloride: 103 mmol/L (ref 96–106)
Creatinine, Ser: 1.21 mg/dL — ABNORMAL HIGH (ref 0.57–1.00)
GFR calc Af Amer: 59 mL/min/{1.73_m2} — ABNORMAL LOW (ref >59)
GFR calc non Af Amer: 52 mL/min/{1.73_m2} — ABNORMAL LOW (ref >59)
Globulin, Total: 3.1 g/dL (ref 1.5–4.5)
Glucose: 159 mg/dL — ABNORMAL HIGH (ref 65–99)
Potassium: 5.5 mmol/L — ABNORMAL HIGH (ref 3.5–5.2)
Sodium: 137 mmol/L (ref 134–144)
Total Protein: 7.1 g/dL (ref 6.0–8.5)

## 2018-12-16 LAB — CBC WITH DIFFERENTIAL/PLATELET
Basophils Absolute: 0 10*3/uL (ref 0.0–0.2)
Basos: 1 %
EOS (ABSOLUTE): 0.1 10*3/uL (ref 0.0–0.4)
Eos: 1 %
Hematocrit: 34.1 % (ref 34.0–46.6)
Hemoglobin: 11 g/dL — ABNORMAL LOW (ref 11.1–15.9)
Immature Grans (Abs): 0 10*3/uL (ref 0.0–0.1)
Immature Granulocytes: 0 %
Lymphocytes Absolute: 1.1 10*3/uL (ref 0.7–3.1)
Lymphs: 25 %
MCH: 28.6 pg (ref 26.6–33.0)
MCHC: 32.3 g/dL (ref 31.5–35.7)
MCV: 89 fL (ref 79–97)
Monocytes Absolute: 0.3 10*3/uL (ref 0.1–0.9)
Monocytes: 8 %
Neutrophils Absolute: 2.7 10*3/uL (ref 1.4–7.0)
Neutrophils: 65 %
Platelets: 29 10*3/uL — CL (ref 150–450)
RBC: 3.85 x10E6/uL (ref 3.77–5.28)
RDW: 13.5 % (ref 11.7–15.4)
WBC: 4.1 10*3/uL (ref 3.4–10.8)

## 2018-12-18 ENCOUNTER — Other Ambulatory Visit: Payer: Self-pay

## 2018-12-18 ENCOUNTER — Ambulatory Visit (INDEPENDENT_AMBULATORY_CARE_PROVIDER_SITE_OTHER): Payer: Self-pay | Admitting: Internal Medicine

## 2018-12-18 ENCOUNTER — Telehealth: Payer: Self-pay | Admitting: Internal Medicine

## 2018-12-18 ENCOUNTER — Encounter: Payer: Self-pay | Admitting: Internal Medicine

## 2018-12-18 VITALS — BP 160/100 | HR 90 | Temp 96.1°F | Resp 12

## 2018-12-18 DIAGNOSIS — R339 Retention of urine, unspecified: Secondary | ICD-10-CM

## 2018-12-18 NOTE — Telephone Encounter (Signed)
Discussed with Dr. Amil Amen Pt. Will have some irritation of the area due to having the cath in there for a period of time. We will give it an couple of days to see.  Pt. To call tomorrow with progress report

## 2018-12-18 NOTE — Telephone Encounter (Signed)
Pt. Informed and will call tomorrow with report

## 2018-12-18 NOTE — Telephone Encounter (Signed)
Patient called stating after removal of the foley cath today she went to the bathroom once and experienced burning with urination and mild pelvic pain that is making her feels uncomfortable. Patient denied any blood with or after  urination and is requesting advises  on what's  to take to help with  the pain.   Please advise

## 2018-12-19 NOTE — Telephone Encounter (Signed)
LVM for patient to return to get an update on how she is feeling since she sis not call today with progress report

## 2018-12-20 NOTE — Telephone Encounter (Signed)
Pt. States she is feeling much better and no problems at all now.

## 2018-12-25 ENCOUNTER — Other Ambulatory Visit: Payer: Self-pay | Admitting: Internal Medicine

## 2018-12-25 DIAGNOSIS — D693 Immune thrombocytopenic purpura: Secondary | ICD-10-CM

## 2018-12-25 DIAGNOSIS — I10 Essential (primary) hypertension: Secondary | ICD-10-CM

## 2018-12-25 DIAGNOSIS — H269 Unspecified cataract: Secondary | ICD-10-CM

## 2018-12-25 DIAGNOSIS — E1159 Type 2 diabetes mellitus with other circulatory complications: Secondary | ICD-10-CM

## 2018-12-25 DIAGNOSIS — Z659 Problem related to unspecified psychosocial circumstances: Secondary | ICD-10-CM

## 2018-12-25 DIAGNOSIS — N189 Chronic kidney disease, unspecified: Secondary | ICD-10-CM

## 2018-12-25 DIAGNOSIS — Z89511 Acquired absence of right leg below knee: Secondary | ICD-10-CM

## 2018-12-25 DIAGNOSIS — H543 Unqualified visual loss, both eyes: Secondary | ICD-10-CM

## 2018-12-25 DIAGNOSIS — R339 Retention of urine, unspecified: Secondary | ICD-10-CM

## 2018-12-25 NOTE — Progress Notes (Signed)
Here to have foley catheter removed and a trial without.   She is aware she will need to go to ED this evening if unable to urinate Please see note from last week on the 11th. No fever, no burning with catheter. Balloon deflated and catheter slipped out easily.   A/P:  Urinary retention:  To call progress report daily regarding urination--first this afternoon before 5 p.m.Marland Kitchen

## 2018-12-25 NOTE — Progress Notes (Signed)
Received message from patient at end of last week that she now has Medicare. Has A & B, not clear if D as well. Will send for referrals to Urology, Heme/Onc and [facility] for nursing, PT/OT.  Cherice--please call and advice patient of all of these referrals.

## 2018-12-26 ENCOUNTER — Telehealth: Payer: Self-pay | Admitting: Hematology and Oncology

## 2018-12-26 ENCOUNTER — Other Ambulatory Visit: Payer: Self-pay | Admitting: *Deleted

## 2018-12-26 DIAGNOSIS — D696 Thrombocytopenia, unspecified: Secondary | ICD-10-CM

## 2018-12-26 NOTE — Telephone Encounter (Signed)
Returned patient's phone call regarding rescheduling missed 11/12 appointments, per patient's request appointment has moved to 12/28.

## 2018-12-31 NOTE — Progress Notes (Signed)
Patient Care Team: Mack Hook, MD as PCP - General (Internal Medicine)  DIAGNOSIS:    ICD-10-CM   1. Acute ITP (HCC)  D69.3     CHIEF COMPLIANT: Relapsed ITP  INTERVAL HISTORY: [name] is a 53 y.o. with above-mentioned history of severe thrombocytopenia. She was admitted from 10/31-11/9 after labs showed platelets 5, and she received 2 units of platelets, dexamethasone, and IVIG.  She recovered her platelet counts and was discharged home.  2 weeks ago she had a blood work that showed a platelet count of 23.  Today her platelet counts of 14. She has not noticed any excessive bleeding. She is a diabetic and has had a right leg amputation.  REVIEW OF SYSTEMS:   Constitutional: Denies fevers, chills or abnormal weight loss Eyes: Diminished vision because of diabetes Ears, nose, mouth, throat, and face: Denies mucositis or sore throat Respiratory: Denies cough, dyspnea or wheezes Cardiovascular: Denies palpitation, chest discomfort Gastrointestinal: Denies nausea, heartburn or change in bowel habits Skin: Denies abnormal skin rashes Lymphatics: Denies new lymphadenopathy or easy bruising Neurological: Denies numbness, tingling or new weaknesses Behavioral/Psych: Mood is stable, no new changes  Extremities: Right above-knee amputation Breast: denies any pain or lumps or nodules in either breasts All other systems were reviewed with the patient and are negative.  I have reviewed the past medical history, past surgical history, social history and family history with the patient and they are unchanged from previous note.  ALLERGIES:  has No Known Allergies.  MEDICATIONS:  Current Outpatient Medications  Medication Sig Dispense Refill  . amLODipine (NORVASC) 10 MG tablet Take 1 tablet (10 mg total) by mouth daily. (Patient not taking: Reported on 12/15/2018) 30 tablet 1  . calcium carbonate (TUMS - DOSED IN MG ELEMENTAL CALCIUM) 500 MG chewable tablet Chew 2 tablets  (400 mg of elemental calcium total) by mouth 3 (three) times daily. 180 tablet 0  . cholecalciferol 5000 units TABS Take 1 tablet (5,000 Units total) by mouth daily. (Patient not taking: Reported on 10/16/2018)    . eltrombopag (PROMACTA) 25 MG tablet Take 1 tablet (25 mg total) by mouth daily. Take on an empty stomach, 1 hour before a meal or 2 hours after. 30 tablet 2  . gabapentin (NEURONTIN) 400 MG capsule Take 1 capsule (400 mg total) by mouth 3 (three) times daily. Take 400 mg by mouth two times a day, and an additional 400 mg as needed for nerve pain (Patient taking differently: Take 400 mg by mouth 3 (three) times daily. ) 90 capsule 11  . metFORMIN (GLUCOPHAGE) 500 MG tablet Take 1 tablet (500 mg total) by mouth 2 (two) times daily with a meal. 60 tablet 1  . tamsulosin (FLOMAX) 0.4 MG CAPS capsule Take 1 capsule (0.4 mg total) by mouth daily. 30 capsule 3   No current facility-administered medications for this visit.    PHYSICAL EXAMINATION: ECOG PERFORMANCE STATUS: 1 - Symptomatic but completely ambulatory  Vitals:   01/01/19 1515  BP: (!) 147/73  Pulse: 85  Resp: 17  Temp: 98.5 F (36.9 C)  SpO2: 98%   Filed Weights    GENERAL: alert, no distress and comfortable SKIN: skin color, texture, turgor are normal, no rashes or significant lesions EYES: normal, Conjunctiva are pink and non-injected, sclera clear OROPHARYNX: no exudate, no erythema and lips, buccal mucosa, and tongue normal  NECK: supple, thyroid normal size, non-tender, without nodularity LYMPH: no palpable lymphadenopathy in the cervical, axillary or inguinal LUNGS: clear to  auscultation and percussion with normal breathing effort HEART: regular rate & rhythm and no murmurs and no lower extremity edema ABDOMEN: abdomen soft, non-tender and normal bowel sounds MUSCULOSKELETAL: no cyanosis of digits and no clubbing  NEURO: alert & oriented x 3 with fluent speech, no focal motor/sensory deficits EXTREMITIES:  Right above-knee amputation  LABORATORY DATA:  I have reviewed the data as listed CMP Latest Ref Rng & Units 12/15/2018 11/13/2018 11/12/2018  Glucose 65 - 99 mg/dL 960(A) 540(J) 811(B)  BUN 6 - 24 mg/dL 18 14(N) 82(N)  Creatinine 0.57 - 1.00 mg/dL 5.62(Z) 3.08(M) 5.78(I)  Sodium 134 - 144 mmol/L 137 138 137  Potassium 3.5 - 5.2 mmol/L 5.5(H) 4.3 4.5  Chloride 96 - 106 mmol/L 103 110 105  CO2 20 - 29 mmol/L 18(L) 22 24  Calcium 8.7 - 10.2 mg/dL 8.7 6.9(G) 8.1(L)  Total Protein 6.0 - 8.5 g/dL 7.1 - -  Total Bilirubin 0.0 - 1.2 mg/dL 0.2 - -  Alkaline Phos 39 - 117 IU/L 90 - -  AST 0 - 40 IU/L 35 - -  ALT 0 - 32 IU/L 19 - -    Lab Results  Component Value Date   WBC 4.5 01/01/2019   HGB 10.8 (L) 01/01/2019   HCT 35.6 (L) 01/01/2019   MCV 93.7 01/01/2019   PLT 14 (L) 01/01/2019   NEUTROABS 2.6 01/01/2019    ASSESSMENT & PLAN:  Acute ITP (HCC) Acute exacerbation of ITP: 11/07/2018: Dexamethasone and IVIG administered: Resolved Relapsed ITP 12/18/2018  Lab review: Platelet count: 14 Discussed with the patient different options including retreatment with IVIG versus Promacta. After much discussion patient did not want to come for infusions and wanted to try Promacta. Please send a prescription to [facility] pharmacy to be filled.  We would like her to come weekly for lab check to see if Val Riles is working. She understands the Promacta does not have an endpoint.  She will probably need to be on it for long period of time.  Return to clinic in 1 week with labs and follow-up and to see how Val Riles is working.  No orders of the defined types were placed in this encounter.  The patient has a good understanding of the overall plan. she agrees with it. she will call with any problems that may develop before the next visit here.  Serena Croissant, MD 01/01/2019  Jenness Corner Dorshimer, am acting as scribe for Dr. Serena Croissant.  I have reviewed the above document for accuracy and  completeness, and I agree with the above.

## 2019-01-01 ENCOUNTER — Other Ambulatory Visit: Payer: Self-pay

## 2019-01-01 ENCOUNTER — Inpatient Hospital Stay: Payer: Self-pay | Attending: Hematology and Oncology

## 2019-01-01 ENCOUNTER — Inpatient Hospital Stay (HOSPITAL_BASED_OUTPATIENT_CLINIC_OR_DEPARTMENT_OTHER): Payer: Self-pay | Admitting: Hematology and Oncology

## 2019-01-01 DIAGNOSIS — D693 Immune thrombocytopenic purpura: Secondary | ICD-10-CM | POA: Insufficient documentation

## 2019-01-01 DIAGNOSIS — D696 Thrombocytopenia, unspecified: Secondary | ICD-10-CM

## 2019-01-01 DIAGNOSIS — E119 Type 2 diabetes mellitus without complications: Secondary | ICD-10-CM | POA: Insufficient documentation

## 2019-01-01 DIAGNOSIS — Z89611 Acquired absence of right leg above knee: Secondary | ICD-10-CM | POA: Insufficient documentation

## 2019-01-01 DIAGNOSIS — Z7984 Long term (current) use of oral hypoglycemic drugs: Secondary | ICD-10-CM | POA: Insufficient documentation

## 2019-01-01 DIAGNOSIS — Z79899 Other long term (current) drug therapy: Secondary | ICD-10-CM | POA: Insufficient documentation

## 2019-01-01 LAB — CBC WITH DIFFERENTIAL (CANCER CENTER ONLY)
Abs Immature Granulocytes: 0.02 10*3/uL (ref 0.00–0.07)
Basophils Absolute: 0 10*3/uL (ref 0.0–0.1)
Basophils Relative: 0 %
Eosinophils Absolute: 0.1 10*3/uL (ref 0.0–0.5)
Eosinophils Relative: 3 %
HCT: 35.6 % — ABNORMAL LOW (ref 36.0–46.0)
Hemoglobin: 10.8 g/dL — ABNORMAL LOW (ref 12.0–15.0)
Immature Granulocytes: 0 %
Lymphocytes Relative: 29 %
Lymphs Abs: 1.3 10*3/uL (ref 0.7–4.0)
MCH: 28.4 pg (ref 26.0–34.0)
MCHC: 30.3 g/dL (ref 30.0–36.0)
MCV: 93.7 fL (ref 80.0–100.0)
Monocytes Absolute: 0.5 10*3/uL (ref 0.1–1.0)
Monocytes Relative: 10 %
Neutro Abs: 2.6 10*3/uL (ref 1.7–7.7)
Neutrophils Relative %: 58 %
Platelet Count: 14 10*3/uL — ABNORMAL LOW (ref 150–400)
RBC: 3.8 MIL/uL — ABNORMAL LOW (ref 3.87–5.11)
RDW: 15.9 % — ABNORMAL HIGH (ref 11.5–15.5)
WBC Count: 4.5 10*3/uL (ref 4.0–10.5)
nRBC: 0 % (ref 0.0–0.2)

## 2019-01-01 MED ORDER — ELTROMBOPAG OLAMINE 25 MG PO TABS: 25.0000 mg | ORAL_TABLET | Freq: Every day | ORAL | 2 refills | Status: DC

## 2019-01-01 NOTE — Assessment & Plan Note (Signed)
Acute exacerbation of ITP: 11/07/2018: Dexamethasone and IVIG administered Lab review:  Return to clinic in 6 months with labs and follow-up

## 2019-01-02 ENCOUNTER — Telehealth: Payer: Self-pay

## 2019-01-02 ENCOUNTER — Telehealth: Payer: Self-pay | Admitting: Pharmacist

## 2019-01-02 NOTE — Telephone Encounter (Signed)
Oral Oncology Pharmacist Encounter  Received new prescription for Promacta (eltombopag) for the treatment of ITP, planned duration until disease progression or unacceptable drug toxicity.  CMP from 12/15/2018 and CBC from 01/01/2019 assessed, no relevant lab abnormalities. Prescription dose and frequency assessed.   Current medication list in Epic reviewed, one DDIs with eltrombopag identified: -Calcium carbonate: Calcium containing products may decrease the concentration of eltrombopag. Older entry on medication list, will confirm patient is still taking. If calcium carbonate is taken in combination with eltrombopag, eltrombopag should be taken at least 2 hours before or 4 hours after calcium carbonate.  Prescription has been e-scribed to the [facility] for benefits analysis and approval.  Oral Oncology Clinic will continue to follow for insurance authorization, copayment issues, initial counseling and start date.  Darl Pikes, PharmD, BCPS, [facility] Hematology/Oncology Clinical Pharmacist [facility] [place] [facility] [phone]  01/02/2019 12:34 PM

## 2019-01-02 NOTE — Telephone Encounter (Signed)
Oral Oncology Patient Advocate Encounter  After completing a benefits investigation, prior authorization for Promacta is not required at this time through [facility].  Patient's copay is $3  [facility] Patient [name] Phone [phone] Fax [phone] 01/02/2019 9:14 AM .

## 2019-01-03 ENCOUNTER — Telehealth: Payer: Self-pay | Admitting: Hematology and Oncology

## 2019-01-03 NOTE — Telephone Encounter (Signed)
No [place] during time of check out.

## 2019-01-03 NOTE — Telephone Encounter (Signed)
Oral Chemotherapy Pharmacist Encounter  Medication will be delivered to [name] by [facility]. The plan is for her to follow up with Dr. Lindi Adie ~1 week after starting. Messaged MD team to let them know about Promacta delivery.  Patient Education I spoke with patient for overview of new oral chemotherapy medication: Promacta (eltombopag) for the treatment of ITP, planned duration until disease progression or unacceptable drug toxicity.  Counseled patient on administration, dosing, side effects, monitoring, drug-food interactions, safe handling, storage, and disposal. Patient will take 1 tablet (25 mg total) by mouth daily. Take on an empty stomach, 1 hour before a meal or 2 hours after.  Side effects include but not limited to: N/V/D.    Reviewed with patient importance of keeping a medication schedule and plan for any missed doses.  [name] voiced understanding and appreciation. All questions answered. Medication handout placed in the mail.  Provided patient with [facility] phone number. Patient knows to call the office with questions or concerns. Oral Chemotherapy Navigation Clinic will continue to follow.  Darl Pikes, PharmD, BCPS, [facility] Hematology/Oncology Clinical Pharmacist [facility] [place] [facility] [phone]  01/03/2019 3:22 PM

## 2019-01-09 ENCOUNTER — Telehealth: Payer: Self-pay

## 2019-01-09 ENCOUNTER — Telehealth: Payer: Self-pay | Admitting: Hematology and Oncology

## 2019-01-09 NOTE — Telephone Encounter (Signed)
Scheduled appt per 12/31 sch message - unable to reach pt and unable to leave message . Message sent to RN

## 2019-01-09 NOTE — Telephone Encounter (Signed)
RN attempted call to notify upcoming appointments, no answer, voicemail box full.

## 2019-01-10 MED FILL — PROMACTA 25 MG TABLET: 25 | 30 days supply | Qty: 30 | Fill #0

## 2019-01-10 NOTE — Telephone Encounter (Signed)
After further investigation, patient only has family planning with Medicaid.  I was able  to obtain a free 30 day trial card from Promacta to get patient started. [facility] will fill today and mail out for delivery tomorrow.  I am also in the process of signing patient up for manufacturer assistance with [employer] PAF.  Cruzita Lederer CPHT Specialty Pharmacy Patient Advocate [facility] Phone [phone] Fax [phone] 01/10/2019 3:03 PM

## 2019-01-11 ENCOUNTER — Telehealth: Payer: Self-pay

## 2019-01-11 NOTE — Telephone Encounter (Signed)
RN returned call, voicemail left for return call.   Pt called to informed she received medication today.  RN reached out to inform of need to reschedule appointments based on medication start date.

## 2019-01-12 ENCOUNTER — Telehealth: Payer: Self-pay

## 2019-01-12 NOTE — Telephone Encounter (Signed)
RN attempted to reach patient, no answer, no voicemail available.

## 2019-01-15 ENCOUNTER — Inpatient Hospital Stay: Payer: Self-pay | Attending: Hematology and Oncology | Admitting: Hematology and Oncology

## 2019-01-15 ENCOUNTER — Inpatient Hospital Stay: Payer: Self-pay

## 2019-01-15 ENCOUNTER — Telehealth: Payer: Self-pay

## 2019-01-15 ENCOUNTER — Telehealth: Payer: Self-pay | Admitting: *Deleted

## 2019-01-15 DIAGNOSIS — D693 Immune thrombocytopenic purpura: Secondary | ICD-10-CM | POA: Insufficient documentation

## 2019-01-15 DIAGNOSIS — Z79899 Other long term (current) drug therapy: Secondary | ICD-10-CM | POA: Insufficient documentation

## 2019-01-15 DIAGNOSIS — H538 Other visual disturbances: Secondary | ICD-10-CM | POA: Insufficient documentation

## 2019-01-15 DIAGNOSIS — Z7984 Long term (current) use of oral hypoglycemic drugs: Secondary | ICD-10-CM | POA: Insufficient documentation

## 2019-01-15 DIAGNOSIS — E119 Type 2 diabetes mellitus without complications: Secondary | ICD-10-CM | POA: Insufficient documentation

## 2019-01-15 NOTE — Assessment & Plan Note (Deleted)
Acute exacerbation of ITP: 11/07/2018: Dexamethasone and IVIG administered: Resolved Relapsed ITP 01/01/2019 (platelet count 14) Current treatment: Promacta started 01/01/2019  Lab review:  Monitoring for toxicities.

## 2019-01-15 NOTE — Telephone Encounter (Signed)
Oral Oncology Patient Advocate Encounter  Met patient in [facility] Lobby to complete an application for [employer] Patient Assistance Foundation (NPAF) in an effort to reduce the patient's out of pocket expense for Promacta to $0.    Application completed and faxed to [phone].   NPAF phone number for follow up is [phone].   This encounter will be updated until final determination.   Elizabeth Tilley CPHT Specialty Pharmacy Patient Advocate [facility] Phone [phone] Fax [phone] 01/15/2019 8:51 AM  

## 2019-01-15 NOTE — Telephone Encounter (Signed)
Attempt x1 to reach pt, no answer, unable to LVM due to voice mail box not being set up.

## 2019-01-15 NOTE — Telephone Encounter (Signed)
[employer] PAF has been approved.  Patient will receive Promacta for $0 until 01/12/20  [employer] PAF [phone]  Cruzita Lederer CPHT Specialty Pharmacy Patient Advocate [facility] Phone [phone] Fax [phone] 01/15/2019 8:53 AM

## 2019-01-16 ENCOUNTER — Other Ambulatory Visit: Payer: Medicaid Other

## 2019-01-16 ENCOUNTER — Telehealth: Payer: Self-pay | Admitting: *Deleted

## 2019-01-16 NOTE — Telephone Encounter (Signed)
Attempt x1 to contact pt regarding schedule, no answer, unable to LVM due to mailbox being full.

## 2019-01-16 NOTE — Telephone Encounter (Signed)
RN able to reach pt to confirm upcoming apt on 01/21/2018.  Pt verbalized understanding of apt date and time.

## 2019-01-18 ENCOUNTER — Other Ambulatory Visit: Payer: Self-pay

## 2019-01-18 ENCOUNTER — Ambulatory Visit: Payer: Self-pay | Admitting: Hematology and Oncology

## 2019-01-19 ENCOUNTER — Ambulatory Visit: Payer: Medicaid Other | Admitting: Internal Medicine

## 2019-01-21 NOTE — Progress Notes (Signed)
Patient Care Team: Julieanne Manson, MD as PCP - General (Internal Medicine)  DIAGNOSIS:    ICD-10-CM   1. Acute ITP (HCC)  D69.3     CHIEF COMPLIANT: Follow-up of relapsed ITP on Promacta  INTERVAL HISTORY: [name] is a 54 y.o. with above-mentioned history of severe thrombocytopenia currently on Promacta. She presents to the clinic today for a toxicity check and to review her labs.  She continues to have extensive bruising of her extremities but she does not have any GI or genitourinary bleeds.  We started her last week on Promacta at 50 mg daily but it does not appear to have worked on her yet.  She is very anxious about taking steroids because it affects her diabetes.  She has very poor vision so she cannot take insulin and because of that she does not want [place] to try prednisone.  Previously prednisone worked extremely well in correcting her ITP.  ALLERGIES:  has No Known Allergies.  MEDICATIONS:  Current Outpatient Medications  Medication Sig Dispense Refill  . amLODipine (NORVASC) 10 MG tablet Take 1 tablet (10 mg total) by mouth daily. (Patient not taking: Reported on 12/15/2018) 30 tablet 1  . calcium carbonate (TUMS - DOSED IN MG ELEMENTAL CALCIUM) 500 MG chewable tablet Chew 2 tablets (400 mg of elemental calcium total) by mouth 3 (three) times daily. 180 tablet 0  . cholecalciferol 5000 units TABS Take 1 tablet (5,000 Units total) by mouth daily. (Patient not taking: Reported on 10/16/2018)    . eltrombopag (PROMACTA) 25 MG tablet Take 1 tablet (25 mg total) by mouth daily. Take on an empty stomach, 1 hour before a meal or 2 hours after. 30 tablet 2  . gabapentin (NEURONTIN) 400 MG capsule Take 1 capsule (400 mg total) by mouth 3 (three) times daily. Take 400 mg by mouth two times a day, and an additional 400 mg as needed for nerve pain (Patient taking differently: Take 400 mg by mouth 3 (three) times daily. ) 90 capsule 11  . metFORMIN (GLUCOPHAGE) 500 MG tablet Take  1 tablet (500 mg total) by mouth 2 (two) times daily with a meal. 60 tablet 1  . tamsulosin (FLOMAX) 0.4 MG CAPS capsule Take 1 capsule (0.4 mg total) by mouth daily. 30 capsule 3   No current facility-administered medications for this visit.    PHYSICAL EXAMINATION: ECOG PERFORMANCE STATUS: 2 - Symptomatic, <50% confined to bed  Vitals:   01/22/19 1314  BP: (!) 155/75  Pulse: 87  Resp: 18  Temp: 98.9 F (37.2 C)  SpO2: 99%   Filed Weights    LABORATORY DATA:  I have reviewed the data as listed CMP Latest Ref Rng & Units 01/22/2019 12/15/2018 11/13/2018  Glucose 70 - 99 mg/dL 299(M) 426(S) 341(D)  BUN 6 - 20 mg/dL 19 18 62(I)  Creatinine 0.44 - 1.00 mg/dL 2.97(L) 8.92(J) 1.94(R)  Sodium 135 - 145 mmol/L 137 137 138  Potassium 3.5 - 5.1 mmol/L 5.3(H) 5.5(H) 4.3  Chloride 98 - 111 mmol/L 105 103 110  CO2 22 - 32 mmol/L 21(L) 18(L) 22  Calcium 8.9 - 10.3 mg/dL 8.3(L) 8.7 8.5(L)  Total Protein 6.5 - 8.1 g/dL 7.7 7.1 -  Total Bilirubin 0.3 - 1.2 mg/dL 0.4 0.2 -  Alkaline Phos 38 - 126 U/L 83 90 -  AST 15 - 41 U/L 46(H) 35 -  ALT 0 - 44 U/L 43 19 -    Lab Results  Component Value Date  WBC 4.4 01/22/2019   HGB 11.1 (L) 01/22/2019   HCT 35.5 (L) 01/22/2019   MCV 92.9 01/22/2019   PLT <5 (LL) 01/22/2019   NEUTROABS 2.7 01/22/2019    ASSESSMENT & PLAN:  Acute ITP (HCC) Acute exacerbation of ITP: 11/07/2018: Dexamethasone and IVIG administered: Resolved Relapsed ITP 12/18/2018 Current treatment: Promacta 50 mg daily started 01/01/2019  Lab review:  Platelet count less than 5 Patient with extensive bruising issues. We discussed with her about different options including prednisone versus IVIG versus rituximab Patient does not want to take prednisone because of its effects on blood sugars. We decided to do IVIG.  She will come in 1 week for recheck of her blood counts.  No orders of the defined types were placed in this encounter.  The patient has a good  understanding of the overall plan. she agrees with it. she will call with any problems that may develop before the next visit here.  Total time spent: 30 mins including face to face time and time spent for planning, charting and coordination of care  Nicholas Lose, MD 01/22/2019  I, Cloyde Reams Dorshimer, am acting as scribe for Dr. Nicholas Lose.  I have reviewed the above documentation for accuracy and completeness, and I agree with the above.

## 2019-01-22 ENCOUNTER — Other Ambulatory Visit: Payer: Self-pay

## 2019-01-22 ENCOUNTER — Inpatient Hospital Stay (HOSPITAL_BASED_OUTPATIENT_CLINIC_OR_DEPARTMENT_OTHER): Payer: Self-pay | Admitting: Hematology and Oncology

## 2019-01-22 ENCOUNTER — Inpatient Hospital Stay: Payer: Self-pay

## 2019-01-22 ENCOUNTER — Other Ambulatory Visit: Payer: Self-pay | Admitting: *Deleted

## 2019-01-22 DIAGNOSIS — D693 Immune thrombocytopenic purpura: Secondary | ICD-10-CM

## 2019-01-22 DIAGNOSIS — D696 Thrombocytopenia, unspecified: Secondary | ICD-10-CM

## 2019-01-22 LAB — CBC WITH DIFFERENTIAL (CANCER CENTER ONLY)
Abs Immature Granulocytes: 0.01 10*3/uL (ref 0.00–0.07)
Basophils Absolute: 0 10*3/uL (ref 0.0–0.1)
Basophils Relative: 1 %
Eosinophils Absolute: 0.1 10*3/uL (ref 0.0–0.5)
Eosinophils Relative: 2 %
HCT: 35.5 % — ABNORMAL LOW (ref 36.0–46.0)
Hemoglobin: 11.1 g/dL — ABNORMAL LOW (ref 12.0–15.0)
Immature Granulocytes: 0 %
Lymphocytes Relative: 28 %
Lymphs Abs: 1.2 10*3/uL (ref 0.7–4.0)
MCH: 29.1 pg (ref 26.0–34.0)
MCHC: 31.3 g/dL (ref 30.0–36.0)
MCV: 92.9 fL (ref 80.0–100.0)
Monocytes Absolute: 0.4 10*3/uL (ref 0.1–1.0)
Monocytes Relative: 9 %
Neutro Abs: 2.7 10*3/uL (ref 1.7–7.7)
Neutrophils Relative %: 60 %
Platelet Count: <5 10*3/uL — CL (ref 150–400)
RBC: 3.82 MIL/uL — ABNORMAL LOW (ref 3.87–5.11)
RDW: 16.7 % — ABNORMAL HIGH (ref 11.5–15.5)
WBC Count: 4.4 10*3/uL (ref 4.0–10.5)
nRBC: 0 % (ref 0.0–0.2)

## 2019-01-22 LAB — CMP (CANCER CENTER ONLY)
ALT: 43 U/L (ref 0–44)
AST: 46 U/L — ABNORMAL HIGH (ref 15–41)
Albumin: 4 g/dL (ref 3.5–5.0)
Alkaline Phosphatase: 83 U/L (ref 38–126)
Anion gap: 11 (ref 5–15)
BUN: 19 mg/dL (ref 6–20)
CO2: 21 mmol/L — ABNORMAL LOW (ref 22–32)
Calcium: 8.3 mg/dL — ABNORMAL LOW (ref 8.9–10.3)
Chloride: 105 mmol/L (ref 98–111)
Creatinine: 1.26 mg/dL — ABNORMAL HIGH (ref 0.44–1.00)
GFR, Est AFR Am: 57 mL/min — ABNORMAL LOW (ref >60)
GFR, Estimated: 49 mL/min — ABNORMAL LOW (ref >60)
Glucose, Bld: 251 mg/dL — ABNORMAL HIGH (ref 70–99)
Potassium: 5.3 mmol/L — ABNORMAL HIGH (ref 3.5–5.1)
Sodium: 137 mmol/L (ref 135–145)
Total Bilirubin: 0.4 mg/dL (ref 0.3–1.2)
Total Protein: 7.7 g/dL (ref 6.5–8.1)

## 2019-01-22 NOTE — Assessment & Plan Note (Signed)
Acute exacerbation of ITP: 11/07/2018: Dexamethasone and IVIG administered: Resolved Relapsed ITP 12/18/2018 Current treatment: Promacta started 01/01/2019  Lab review:

## 2019-01-22 NOTE — Progress Notes (Signed)
CRITICAL VALUE ALERT  Critical Value:  plt <5  Date & Time Notied:  01/22/2019 at 1320  Provider Notified: Serena Croissant, MD  Orders Received/Actions taken: MD aware and verbalized understanding.

## 2019-01-23 ENCOUNTER — Ambulatory Visit: Payer: Self-pay | Admitting: Hematology and Oncology

## 2019-01-23 ENCOUNTER — Telehealth: Payer: Self-pay

## 2019-01-23 ENCOUNTER — Other Ambulatory Visit: Payer: Self-pay

## 2019-01-23 NOTE — Telephone Encounter (Signed)
RN placed call X 2 to inform regarding upcoming IVIG appointment.  Mailbox full.

## 2019-01-24 ENCOUNTER — Telehealth: Payer: Self-pay | Admitting: *Deleted

## 2019-01-24 NOTE — Telephone Encounter (Signed)
Attempt x1 to contact pt regrding upcoming apts.  No answer, unable to LVM due to mailbox being full.

## 2019-01-24 NOTE — Telephone Encounter (Signed)
Second attempt to contact pt regarding apt scheduled for tomorrow.  No answer, unable to LVM due to mailbox being full.  Will attempt again later.

## 2019-01-24 NOTE — Telephone Encounter (Signed)
Attempt x3 to contact pt regarding upcoming apt for IVIG infusion tomorrow.  RN able to speak with pt and confirm pt apt at 7:30 am tomorrow and Friday for IVIG infusion.  Pt verbalized understanding.

## 2019-01-25 ENCOUNTER — Other Ambulatory Visit: Payer: Self-pay

## 2019-01-25 ENCOUNTER — Inpatient Hospital Stay: Payer: Self-pay

## 2019-01-25 ENCOUNTER — Ambulatory Visit: Payer: Self-pay | Admitting: Hematology and Oncology

## 2019-01-25 VITALS — BP 151/69 | HR 95 | Temp 98.7°F | Resp 18

## 2019-01-25 DIAGNOSIS — D693 Immune thrombocytopenic purpura: Secondary | ICD-10-CM

## 2019-01-25 MED ORDER — DEXTROSE 5 % IV SOLN
Freq: Once | INTRAVENOUS | Status: AC
  Filled 2019-01-25: qty 250

## 2019-01-25 MED ORDER — DIPHENHYDRAMINE HCL 50 MG/ML IJ SOLN
INTRAMUSCULAR | Status: AC
  Filled 2019-01-25: qty 1

## 2019-01-25 MED ORDER — ACETAMINOPHEN 325 MG PO TABS
ORAL_TABLET | ORAL | Status: AC
  Filled 2019-01-25: qty 2

## 2019-01-25 MED ORDER — DIPHENHYDRAMINE HCL 50 MG/ML IJ SOLN
50.0000 mg | Freq: Once | INTRAMUSCULAR | Status: AC
  Administered 2019-01-25: 50 mg via INTRAVENOUS

## 2019-01-25 MED ORDER — ACETAMINOPHEN 325 MG PO TABS
650.0000 mg | ORAL_TABLET | Freq: Once | ORAL | Status: AC
  Administered 2019-01-25: 650 mg via ORAL

## 2019-01-25 MED ORDER — IMMUNE GLOBULIN (HUMAN) 10 GM/100ML IV SOLN
100.0000 g | INTRAVENOUS | Status: DC
  Administered 2019-01-25: 100 g via INTRAVENOUS
  Filled 2019-01-25: qty 800

## 2019-01-25 NOTE — Patient Instructions (Signed)
Immune Globulin Injection What is this medicine? IMMUNE GLOBULIN (im MUNE GLOB yoo lin) helps to prevent or reduce the severity of certain infections in patients who are at risk. This medicine is collected from the pooled blood of many donors. It is used to treat immune system problems, thrombocytopenia, and Kawasaki syndrome. This medicine may be used for other purposes; ask your health care provider or pharmacist if you have questions. COMMON BRAND NAME(S): ASCENIV, Baygam, BIVIGAM, Carimune, Carimune NF, cutaquig, Cuvitru, Flebogamma, Flebogamma DIF, GamaSTAN, GamaSTAN S/D, Gamimune N, Gammagard, Gammagard S/D, Gammaked, Gammaplex, Gammar-P IV, Gamunex, Gamunex-C, Hizentra, Iveegam, Iveegam EN, Octagam, Panglobulin, Panglobulin NF, panzyga, Polygam S/D, Privigen, Sandoglobulin, Venoglobulin-S, Vigam, Vivaglobulin, Xembify What should I tell my health care provider before I take this medicine? They need to know if you have any of these conditions:  diabetes  extremely low or no immune antibodies in the blood  heart disease  history of blood clots  hyperprolinemia  infection in the blood, sepsis  kidney disease  recently received or scheduled to receive a vaccination  an unusual or allergic reaction to human immune globulin, albumin, maltose, sucrose, other medicines, foods, dyes, or preservatives  pregnant or trying to get pregnant  breast-feeding How should I use this medicine? This medicine is for injection into a muscle or infusion into a vein or skin. It is usually given by a health care professional in a hospital or clinic setting. In rare cases, some brands of this medicine might be given at home. You will be taught how to give this medicine. Use exactly as directed. Take your medicine at regular intervals. Do not take your medicine more often than directed. Talk to your pediatrician regarding the use of this medicine in children. While this drug may be prescribed for selected  conditions, precautions do apply. Overdosage: If you think you have taken too much of this medicine contact a poison control center or emergency room at once. NOTE: This medicine is only for you. Do not share this medicine with others. What if I miss a dose? It is important not to miss your dose. Call your doctor or health care professional if you are unable to keep an appointment. If you give yourself the medicine and you miss a dose, take it as soon as you can. If it is almost time for your next dose, take only that dose. Do not take double or extra doses. What may interact with this medicine?  aspirin and aspirin-like medicines  cisplatin  cyclosporine  medicines for infection like acyclovir, adefovir, amphotericin B, bacitracin, cidofovir, foscarnet, ganciclovir, gentamicin, pentamidine, vancomycin  NSAIDS, medicines for pain and inflammation, like ibuprofen or naproxen  pamidronate  vaccines  zoledronic acid This list may not describe all possible interactions. Give your health care provider a list of all the medicines, herbs, non-prescription drugs, or dietary supplements you use. Also tell them if you smoke, drink alcohol, or use illegal drugs. Some items may interact with your medicine. What should I watch for while using this medicine? Your condition will be monitored carefully while you are receiving this medicine. This medicine is made from pooled blood donations of many different people. It may be possible to pass an infection in this medicine. However, the donors are screened for infections and all products are tested for HIV and hepatitis. The medicine is treated to kill most or all bacteria and viruses. Talk to your doctor about the risks and benefits of this medicine. Do not have vaccinations for at least   14 days before, or until at least 3 months after receiving this medicine. What side effects may I notice from receiving this medicine? Side effects that you should report  to your doctor or health care professional as soon as possible:  allergic reactions like skin rash, itching or hives, swelling of the face, lips, or tongue  blue colored lips or skin  breathing problems  chest pain or tightness  fever  signs and symptoms of aseptic meningitis such as stiff neck; sensitivity to light; headache; drowsiness; fever; nausea; vomiting; rash  signs and symptoms of a blood clot such as chest pain; shortness of breath; pain, swelling, or warmth in the leg  signs and symptoms of hemolytic anemia such as fast heartbeat; tiredness; dark yellow or brown urine; or yellowing of the eyes or skin  signs and symptoms of kidney injury like trouble passing urine or change in the amount of urine  sudden weight gain  swelling of the ankles, feet, hands Side effects that usually do not require medical attention (report to your doctor or health care professional if they continue or are bothersome):  diarrhea  flushing  headache  increased sweating  joint pain  muscle cramps  muscle pain  nausea  pain, redness, or irritation at site where injected  tiredness This list may not describe all possible side effects. Call your doctor for medical advice about side effects. You may report side effects to FDA at 1-800-FDA-1088. Where should I keep my medicine? Keep out of the reach of children. This drug is usually given in a hospital or clinic and will not be stored at home. In rare cases, some brands of this medicine may be given at home. If you are using this medicine at home, you will be instructed on how to store this medicine. Throw away any unused medicine after the expiration date on the label. NOTE: This sheet is a summary. It may not cover all possible information. If you have questions about this medicine, talk to your doctor, pharmacist, or health care provider.  2020 Elsevier/Gold Standard (2018-07-26 12:51:14)  

## 2019-01-26 ENCOUNTER — Inpatient Hospital Stay: Payer: Self-pay

## 2019-01-26 ENCOUNTER — Other Ambulatory Visit: Payer: Self-pay

## 2019-01-26 ENCOUNTER — Other Ambulatory Visit: Payer: Self-pay | Admitting: *Deleted

## 2019-01-26 VITALS — BP 155/72 | HR 99 | Temp 98.3°F | Resp 18

## 2019-01-26 DIAGNOSIS — D693 Immune thrombocytopenic purpura: Secondary | ICD-10-CM

## 2019-01-26 DIAGNOSIS — D696 Thrombocytopenia, unspecified: Secondary | ICD-10-CM

## 2019-01-26 MED ORDER — IMMUNE GLOBULIN (HUMAN) 10 GM/100ML IV SOLN
100.0000 g | Freq: Once | INTRAVENOUS | Status: AC
  Administered 2019-01-26: 100 g via INTRAVENOUS
  Filled 2019-01-26: qty 600

## 2019-01-26 MED ORDER — DIPHENHYDRAMINE HCL 25 MG PO TABS
25.0000 mg | ORAL_TABLET | Freq: Once | ORAL | Status: AC
  Administered 2019-01-26: 25 mg via ORAL
  Filled 2019-01-26: qty 1

## 2019-01-26 MED ORDER — ACETAMINOPHEN 325 MG PO TABS
ORAL_TABLET | ORAL | Status: AC
  Filled 2019-01-26: qty 2

## 2019-01-26 MED ORDER — DEXTROSE 5 % IV SOLN
INTRAVENOUS | Status: DC
  Filled 2019-01-26: qty 250

## 2019-01-26 MED ORDER — ACETAMINOPHEN 325 MG PO TABS
650.0000 mg | ORAL_TABLET | Freq: Once | ORAL | Status: AC
  Administered 2019-01-26: 650 mg via ORAL

## 2019-01-26 MED ORDER — IMMUNE GLOBULIN (HUMAN) 10 GM/100ML IV SOLN
100.0000 g | Freq: Once | INTRAVENOUS | Status: DC
  Filled 2019-01-26: qty 1000

## 2019-01-26 MED ORDER — DIPHENHYDRAMINE HCL 25 MG PO CAPS
ORAL_CAPSULE | ORAL | Status: AC
  Filled 2019-01-26: qty 1

## 2019-01-26 NOTE — Patient Instructions (Signed)
Immune Globulin Injection What is this medicine? IMMUNE GLOBULIN (im MUNE GLOB yoo lin) helps to prevent or reduce the severity of certain infections in patients who are at risk. This medicine is collected from the pooled blood of many donors. It is used to treat immune system problems, thrombocytopenia, and Kawasaki syndrome. This medicine may be used for other purposes; ask your health care provider or pharmacist if you have questions. COMMON BRAND NAME(S): ASCENIV, Baygam, BIVIGAM, Carimune, Carimune NF, cutaquig, Cuvitru, Flebogamma, Flebogamma DIF, GamaSTAN, GamaSTAN S/D, Gamimune N, Gammagard, Gammagard S/D, Gammaked, Gammaplex, Gammar-P IV, Gamunex, Gamunex-C, Hizentra, Iveegam, Iveegam EN, Octagam, Panglobulin, Panglobulin NF, panzyga, Polygam S/D, Privigen, Sandoglobulin, Venoglobulin-S, Vigam, Vivaglobulin, Xembify What should I tell my health care provider before I take this medicine? They need to know if you have any of these conditions:  diabetes  extremely low or no immune antibodies in the blood  heart disease  history of blood clots  hyperprolinemia  infection in the blood, sepsis  kidney disease  recently received or scheduled to receive a vaccination  an unusual or allergic reaction to human immune globulin, albumin, maltose, sucrose, other medicines, foods, dyes, or preservatives  pregnant or trying to get pregnant  breast-feeding How should I use this medicine? This medicine is for injection into a muscle or infusion into a vein or skin. It is usually given by a health care professional in a hospital or clinic setting. In rare cases, some brands of this medicine might be given at home. You will be taught how to give this medicine. Use exactly as directed. Take your medicine at regular intervals. Do not take your medicine more often than directed. Talk to your pediatrician regarding the use of this medicine in children. While this drug may be prescribed for selected  conditions, precautions do apply. Overdosage: If you think you have taken too much of this medicine contact a poison control center or emergency room at once. NOTE: This medicine is only for you. Do not share this medicine with others. What if I miss a dose? It is important not to miss your dose. Call your doctor or health care professional if you are unable to keep an appointment. If you give yourself the medicine and you miss a dose, take it as soon as you can. If it is almost time for your next dose, take only that dose. Do not take double or extra doses. What may interact with this medicine?  aspirin and aspirin-like medicines  cisplatin  cyclosporine  medicines for infection like acyclovir, adefovir, amphotericin B, bacitracin, cidofovir, foscarnet, ganciclovir, gentamicin, pentamidine, vancomycin  NSAIDS, medicines for pain and inflammation, like ibuprofen or naproxen  pamidronate  vaccines  zoledronic acid This list may not describe all possible interactions. Give your health care provider a list of all the medicines, herbs, non-prescription drugs, or dietary supplements you use. Also tell them if you smoke, drink alcohol, or use illegal drugs. Some items may interact with your medicine. What should I watch for while using this medicine? Your condition will be monitored carefully while you are receiving this medicine. This medicine is made from pooled blood donations of many different people. It may be possible to pass an infection in this medicine. However, the donors are screened for infections and all products are tested for HIV and hepatitis. The medicine is treated to kill most or all bacteria and viruses. Talk to your doctor about the risks and benefits of this medicine. Do not have vaccinations for at least   14 days before, or until at least 3 months after receiving this medicine. What side effects may I notice from receiving this medicine? Side effects that you should report  to your doctor or health care professional as soon as possible:  allergic reactions like skin rash, itching or hives, swelling of the face, lips, or tongue  blue colored lips or skin  breathing problems  chest pain or tightness  fever  signs and symptoms of aseptic meningitis such as stiff neck; sensitivity to light; headache; drowsiness; fever; nausea; vomiting; rash  signs and symptoms of a blood clot such as chest pain; shortness of breath; pain, swelling, or warmth in the leg  signs and symptoms of hemolytic anemia such as fast heartbeat; tiredness; dark yellow or brown urine; or yellowing of the eyes or skin  signs and symptoms of kidney injury like trouble passing urine or change in the amount of urine  sudden weight gain  swelling of the ankles, feet, hands Side effects that usually do not require medical attention (report to your doctor or health care professional if they continue or are bothersome):  diarrhea  flushing  headache  increased sweating  joint pain  muscle cramps  muscle pain  nausea  pain, redness, or irritation at site where injected  tiredness This list may not describe all possible side effects. Call your doctor for medical advice about side effects. You may report side effects to FDA at 1-800-FDA-1088. Where should I keep my medicine? Keep out of the reach of children. This drug is usually given in a hospital or clinic and will not be stored at home. In rare cases, some brands of this medicine may be given at home. If you are using this medicine at home, you will be instructed on how to store this medicine. Throw away any unused medicine after the expiration date on the label. NOTE: This sheet is a summary. It may not cover all possible information. If you have questions about this medicine, talk to your doctor, pharmacist, or health care provider.  2020 Elsevier/Gold Standard (2018-07-26 12:51:14)  

## 2019-01-29 ENCOUNTER — Inpatient Hospital Stay: Payer: Self-pay | Admitting: Hematology and Oncology

## 2019-01-29 ENCOUNTER — Inpatient Hospital Stay: Payer: Self-pay

## 2019-01-29 NOTE — Assessment & Plan Note (Deleted)
Acute exacerbation of ITP: 11/07/2018: Dexamethasone and IVIG administered: Resolved Relapsed ITP 12/18/2018 Current treatment: Promacta 50 mg daily started 01/01/2019 (did not respond) IVIG treatment 01/24/2019 and 01/25/2019  Lab review: Today's platelet count is Return to clinic weekly for lab checks. 

## 2019-01-30 ENCOUNTER — Ambulatory Visit: Payer: Self-pay | Admitting: Hematology and Oncology

## 2019-01-30 ENCOUNTER — Other Ambulatory Visit: Payer: Self-pay

## 2019-02-01 ENCOUNTER — Ambulatory Visit: Payer: Self-pay | Admitting: Hematology and Oncology

## 2019-02-01 ENCOUNTER — Other Ambulatory Visit: Payer: Self-pay

## 2019-02-04 NOTE — Progress Notes (Signed)
Patient Care Team: Mack Hook, MD as PCP - General (Internal Medicine)  DIAGNOSIS:    ICD-10-CM   1. Acute ITP (HCC)  D69.3 CBC with Differential (Cancer Center Only)    CHIEF COMPLIANT: Follow-up of relapsed ITP on Promacta   INTERVAL HISTORY: [name] is a 54 y.o. with above-mentioned history of severe thrombocytopenia currently on Promacta and who received to doses of IVIG on 1/21 and 1/22. She presents to the clinic today for a toxicity check and to review her labs.   Will be initially treated with Promacta alone she did not respond to treatment.  But after IVIG she had a remarkable improvement in the platelet count of 121 today.  ALLERGIES:  has No Known Allergies.  MEDICATIONS:  Current Outpatient Medications  Medication Sig Dispense Refill  . amLODipine (NORVASC) 10 MG tablet Take 1 tablet (10 mg total) by mouth daily. (Patient not taking: Reported on 12/15/2018) 30 tablet 1  . calcium carbonate (TUMS - DOSED IN MG ELEMENTAL CALCIUM) 500 MG chewable tablet Chew 2 tablets (400 mg of elemental calcium total) by mouth 3 (three) times daily. 180 tablet 0  . cholecalciferol 5000 units TABS Take 1 tablet (5,000 Units total) by mouth daily. (Patient not taking: Reported on 10/16/2018)    . eltrombopag (PROMACTA) 25 MG tablet Take 1 tablet (25 mg total) by mouth daily. Take on an empty stomach, 1 hour before a meal or 2 hours after. 30 tablet 2  . gabapentin (NEURONTIN) 400 MG capsule Take 1 capsule (400 mg total) by mouth 3 (three) times daily. Take 400 mg by mouth two times a day, and an additional 400 mg as needed for nerve pain (Patient taking differently: Take 400 mg by mouth 3 (three) times daily. ) 90 capsule 11  . metFORMIN (GLUCOPHAGE) 500 MG tablet Take 1 tablet (500 mg total) by mouth 2 (two) times daily with a meal. 60 tablet 1  . tamsulosin (FLOMAX) 0.4 MG CAPS capsule Take 1 capsule (0.4 mg total) by mouth daily. 30 capsule 3   No current  facility-administered medications for this visit.    PHYSICAL EXAMINATION: ECOG PERFORMANCE STATUS: 2 - Symptomatic, <50% confined to bed  Vitals:   02/05/19 1413  BP: 130/61  Pulse: 80  Resp: 20  Temp: 98.9 F (37.2 C)  SpO2: 92%   There were no vitals filed for this visit.  LABORATORY DATA:  I have reviewed the data as listed CMP Latest Ref Rng & Units 02/05/2019 01/22/2019 12/15/2018  Glucose 70 - 99 mg/dL 163(H) 251(H) 159(H)  BUN 6 - 20 mg/dL 35(H) 19 18  Creatinine 0.44 - 1.00 mg/dL 1.74(H) 1.26(H) 1.21(H)  Sodium 135 - 145 mmol/L 134(L) 137 137  Potassium 3.5 - 5.1 mmol/L 5.0 5.3(H) 5.5(H)  Chloride 98 - 111 mmol/L 105 105 103  CO2 22 - 32 mmol/L 17(L) 21(L) 18(L)  Calcium 8.9 - 10.3 mg/dL 8.1(L) 8.3(L) 8.7  Total Protein 6.5 - 8.1 g/dL 8.5(H) 7.7 7.1  Total Bilirubin 0.3 - 1.2 mg/dL 0.3 0.4 0.2  Alkaline Phos 38 - 126 U/L 73 83 90  AST 15 - 41 U/L 40 46(H) 35  ALT 0 - 44 U/L 38 43 19    Lab Results  Component Value Date   WBC 4.1 02/05/2019   HGB 9.7 (L) 02/05/2019   HCT 31.4 (L) 02/05/2019   MCV 94.0 02/05/2019   PLT 120 (L) 02/05/2019   NEUTROABS 2.3 02/05/2019    ASSESSMENT & PLAN:  Acute ITP (HCC) Acute exacerbation of ITP: 11/07/2018: Dexamethasone and IVIG administered: Resolved Relapsed ITP 12/18/2018 Current treatment: Promacta 50 mg daily started 01/01/2019 (did not respond) IVIG treatment 01/24/2019 and 01/25/2019 I recommended continuation of Promacta.  Lab review: Today's platelet count is 120 Return to clinic in 4 weeks with labs and follow-up    Orders Placed This Encounter  Procedures  . CBC with Differential (Cancer Center Only)    Standing Status:   Future    Standing Expiration Date:   02/05/2020   The patient has a good understanding of the overall plan. she agrees with it. she will call with any problems that may develop before the next visit here.  Total time spent: 20 mins including face to face time and time spent for planning,  charting and coordination of care  Serena Croissant, MD 02/05/2019  I, Kirt Boys Dorshimer, am acting as scribe for Dr. Serena Croissant.  I have reviewed the above documentation for accuracy and completeness, and I agree with the above.

## 2019-02-05 ENCOUNTER — Inpatient Hospital Stay: Payer: Self-pay | Attending: Hematology and Oncology | Admitting: Hematology and Oncology

## 2019-02-05 ENCOUNTER — Inpatient Hospital Stay: Payer: Self-pay

## 2019-02-05 ENCOUNTER — Other Ambulatory Visit: Payer: Self-pay

## 2019-02-05 DIAGNOSIS — Z7984 Long term (current) use of oral hypoglycemic drugs: Secondary | ICD-10-CM | POA: Insufficient documentation

## 2019-02-05 DIAGNOSIS — D693 Immune thrombocytopenic purpura: Secondary | ICD-10-CM

## 2019-02-05 DIAGNOSIS — D696 Thrombocytopenia, unspecified: Secondary | ICD-10-CM

## 2019-02-05 DIAGNOSIS — Z79899 Other long term (current) drug therapy: Secondary | ICD-10-CM | POA: Insufficient documentation

## 2019-02-05 LAB — CMP (CANCER CENTER ONLY)
ALT: 38 U/L (ref 0–44)
AST: 40 U/L (ref 15–41)
Albumin: 3.5 g/dL (ref 3.5–5.0)
Alkaline Phosphatase: 73 U/L (ref 38–126)
Anion gap: 12 (ref 5–15)
BUN: 35 mg/dL — ABNORMAL HIGH (ref 6–20)
CO2: 17 mmol/L — ABNORMAL LOW (ref 22–32)
Calcium: 8.1 mg/dL — ABNORMAL LOW (ref 8.9–10.3)
Chloride: 105 mmol/L (ref 98–111)
Creatinine: 1.74 mg/dL — ABNORMAL HIGH (ref 0.44–1.00)
GFR, Est AFR Am: 38 mL/min — ABNORMAL LOW (ref >60)
GFR, Estimated: 33 mL/min — ABNORMAL LOW (ref >60)
Glucose, Bld: 163 mg/dL — ABNORMAL HIGH (ref 70–99)
Potassium: 5 mmol/L (ref 3.5–5.1)
Sodium: 134 mmol/L — ABNORMAL LOW (ref 135–145)
Total Bilirubin: 0.3 mg/dL (ref 0.3–1.2)
Total Protein: 8.5 g/dL — ABNORMAL HIGH (ref 6.5–8.1)

## 2019-02-05 LAB — CBC WITH DIFFERENTIAL (CANCER CENTER ONLY)
Abs Immature Granulocytes: 0.01 10*3/uL (ref 0.00–0.07)
Basophils Absolute: 0 10*3/uL (ref 0.0–0.1)
Basophils Relative: 1 %
Eosinophils Absolute: 0 10*3/uL (ref 0.0–0.5)
Eosinophils Relative: 1 %
HCT: 31.4 % — ABNORMAL LOW (ref 36.0–46.0)
Hemoglobin: 9.7 g/dL — ABNORMAL LOW (ref 12.0–15.0)
Immature Granulocytes: 0 %
Lymphocytes Relative: 32 %
Lymphs Abs: 1.3 10*3/uL (ref 0.7–4.0)
MCH: 29 pg (ref 26.0–34.0)
MCHC: 30.9 g/dL (ref 30.0–36.0)
MCV: 94 fL (ref 80.0–100.0)
Monocytes Absolute: 0.4 10*3/uL (ref 0.1–1.0)
Monocytes Relative: 10 %
Neutro Abs: 2.3 10*3/uL (ref 1.7–7.7)
Neutrophils Relative %: 56 %
Platelet Count: 120 10*3/uL — ABNORMAL LOW (ref 150–400)
RBC: 3.34 MIL/uL — ABNORMAL LOW (ref 3.87–5.11)
RDW: 18.3 % — ABNORMAL HIGH (ref 11.5–15.5)
WBC Count: 4.1 10*3/uL (ref 4.0–10.5)
nRBC: 0.5 % — ABNORMAL HIGH (ref 0.0–0.2)

## 2019-02-05 NOTE — Assessment & Plan Note (Signed)
Acute exacerbation of ITP: 11/07/2018: Dexamethasone and IVIG administered: Resolved Relapsed ITP 12/18/2018 Current treatment: Promacta 50 mg daily started 01/01/2019 (did not respond) IVIG treatment 01/24/2019 and 01/25/2019  Lab review: Today's platelet count is Return to clinic weekly for lab checks.

## 2019-02-06 ENCOUNTER — Telehealth: Payer: Self-pay | Admitting: Hematology and Oncology

## 2019-02-06 NOTE — Telephone Encounter (Signed)
Patient could not be reached no voicemail set up, will mail

## 2019-02-13 ENCOUNTER — Emergency Department (HOSPITAL_COMMUNITY)
Admission: EM | Admit: 2019-02-13 | Discharge: 2019-02-13 | Disposition: A | Discharge status: Home / Self Care | Payer: Medicaid Other | Attending: Emergency Medicine | Admitting: Emergency Medicine

## 2019-02-13 ENCOUNTER — Encounter (HOSPITAL_COMMUNITY): Payer: Self-pay | Admitting: Emergency Medicine

## 2019-02-13 ENCOUNTER — Emergency Department (HOSPITAL_COMMUNITY): Payer: Medicaid Other

## 2019-02-13 ENCOUNTER — Other Ambulatory Visit: Payer: Self-pay

## 2019-02-13 DIAGNOSIS — Z79899 Other long term (current) drug therapy: Secondary | ICD-10-CM | POA: Insufficient documentation

## 2019-02-13 DIAGNOSIS — E1122 Type 2 diabetes mellitus with diabetic chronic kidney disease: Secondary | ICD-10-CM | POA: Insufficient documentation

## 2019-02-13 DIAGNOSIS — N183 Chronic kidney disease, stage 3 unspecified: Secondary | ICD-10-CM | POA: Insufficient documentation

## 2019-02-13 DIAGNOSIS — Z7984 Long term (current) use of oral hypoglycemic drugs: Secondary | ICD-10-CM | POA: Insufficient documentation

## 2019-02-13 DIAGNOSIS — D693 Immune thrombocytopenic purpura: Secondary | ICD-10-CM | POA: Insufficient documentation

## 2019-02-13 DIAGNOSIS — R0789 Other chest pain: Secondary | ICD-10-CM | POA: Insufficient documentation

## 2019-02-13 DIAGNOSIS — E041 Nontoxic single thyroid nodule: Secondary | ICD-10-CM | POA: Insufficient documentation

## 2019-02-13 DIAGNOSIS — I129 Hypertensive chronic kidney disease with stage 1 through stage 4 chronic kidney disease, or unspecified chronic kidney disease: Secondary | ICD-10-CM | POA: Insufficient documentation

## 2019-02-13 LAB — BASIC METABOLIC PANEL
Anion gap: 11 (ref 5–15)
BUN: 40 mg/dL — ABNORMAL HIGH (ref 6–20)
CO2: 20 mmol/L — ABNORMAL LOW (ref 22–32)
Calcium: 8.7 mg/dL — ABNORMAL LOW (ref 8.9–10.3)
Chloride: 102 mmol/L (ref 98–111)
Creatinine, Ser: 1.5 mg/dL — ABNORMAL HIGH (ref 0.44–1.00)
GFR calc Af Amer: 46 mL/min — ABNORMAL LOW (ref >60)
GFR calc non Af Amer: 40 mL/min — ABNORMAL LOW (ref >60)
Glucose, Bld: 122 mg/dL — ABNORMAL HIGH (ref 70–99)
Potassium: 4.9 mmol/L (ref 3.5–5.1)
Sodium: 133 mmol/L — ABNORMAL LOW (ref 135–145)

## 2019-02-13 LAB — CBC WITH DIFFERENTIAL/PLATELET
Abs Immature Granulocytes: 0.02 10*3/uL (ref 0.00–0.07)
Basophils Absolute: 0 10*3/uL (ref 0.0–0.1)
Basophils Relative: 1 %
Eosinophils Absolute: 0.2 10*3/uL (ref 0.0–0.5)
Eosinophils Relative: 4 %
HCT: 34.6 % — ABNORMAL LOW (ref 36.0–46.0)
Hemoglobin: 10.8 g/dL — ABNORMAL LOW (ref 12.0–15.0)
Immature Granulocytes: 1 %
Lymphocytes Relative: 22 %
Lymphs Abs: 0.9 10*3/uL (ref 0.7–4.0)
MCH: 29.9 pg (ref 26.0–34.0)
MCHC: 31.2 g/dL (ref 30.0–36.0)
MCV: 95.8 fL (ref 80.0–100.0)
Monocytes Absolute: 0.4 10*3/uL (ref 0.1–1.0)
Monocytes Relative: 9 %
Neutro Abs: 2.6 10*3/uL (ref 1.7–7.7)
Neutrophils Relative %: 63 %
Platelets: 39 10*3/uL — ABNORMAL LOW (ref 150–400)
RBC: 3.61 MIL/uL — ABNORMAL LOW (ref 3.87–5.11)
RDW: 18.7 % — ABNORMAL HIGH (ref 11.5–15.5)
WBC: 4.1 10*3/uL (ref 4.0–10.5)
nRBC: 0.5 % — ABNORMAL HIGH (ref 0.0–0.2)

## 2019-02-13 MED ORDER — LIDOCAINE 5 % EX PTCH
1.0000 | MEDICATED_PATCH | CUTANEOUS | Status: DC
  Administered 2019-02-13: 1 via TRANSDERMAL
  Filled 2019-02-13: qty 1

## 2019-02-13 MED ORDER — IOHEXOL 350 MG/ML SOLN
100.0000 mL | Freq: Once | INTRAVENOUS | Status: AC | PRN
  Administered 2019-02-13: 100 mL via INTRAVENOUS

## 2019-02-13 NOTE — ED Notes (Signed)
Pt assisted to bathroom via wheelchair.

## 2019-02-13 NOTE — ED Provider Notes (Signed)
[name] EMERGENCY DEPARTMENT Provider Note   CSN: [id number] Arrival date & time: 02/13/19  1448     History No chief complaint on file.   [name] is a 54 y.o. female.  54 year old female with past medical history of ITP, diabetes, additional history listed below.  Patient presents with complaint of pain in her right mid axillary lower ribs onset 2 days ago without fall or injury.  Patient states pain is worse with palpation of the area or movement of her right arm.  Patient denies shortness of breath, abdominal pain or recent colds or illnesses cough, fever.  No other complaints or concerns today.        Past Medical History:  Diagnosis Date   Anemia    Arthritis    "spine, hands" (11/25/2015)   Back pain    "all my back; probably 3 times/week" (11/25/2015)   Chronic indwelling Foley catheter 03/04/2017   Diabetic foot ulcer associated with type 2 diabetes mellitus (HCC)    Hypertension    Intracerebral hemorrhage (HCC) As a teenager    States she had burst blood vessel as teenager, now with resultant minor visual field and hearing deficits   Neuropathy    Stroke (HCC) ~ 1982   "my feeling on my RLE, right lower eye vision,  & hearing out of my right ear not the same since" (11/25/2015)   Type II diabetes mellitus (HCC)     Patient Active Problem List   Diagnosis Date Noted   Acute ITP (HCC) 11/16/2018   Onychomycosis of toenail 10/16/2018   Cataract of both eyes 10/16/2018   Tinea pedis of left foot 10/16/2018   Decreased vision in both eyes 10/16/2018   CKD (chronic kidney disease) stage 3, GFR 30-59 ml/min    Cellulitis of left lower extremity 01/25/2018   Hyperkalemia 01/25/2018   Poor compliance 03/08/2017   Hypoxia 03/07/2017   Thyroid nodule 03/07/2017   Chronic indwelling Foley catheter 03/04/2017   Type II diabetes mellitus (HCC)    Femur fracture (HCC) 03/02/2017   Orthostatic dizziness     Urinary tract infection without hematuria    Enterococcus faecalis infection    Near syncope 11/25/2015   Dehydration 11/25/2015   Orthostatic hypotension 11/25/2015   UTI (urinary tract infection) 11/25/2015   Acute worsening of stage 3 chronic kidney disease 09/24/2015   Diabetes mellitus due to underlying condition with chronic kidney disease, without long-term current use of insulin (HCC)    Fever    Status post below knee amputation of right lower extremity (HCC)    Diabetic peripheral neuropathy (HCC)    Neuropathic pain    Benign essential HTN    Folliculitis    Slow transit constipation    Anemia of chronic disease    Bilateral hydronephrosis    Urinary retention    CKD (chronic kidney disease)    Uncontrolled type 2 diabetes mellitus with diabetic nephropathy, without long-term current use of insulin (HCC)    Vasculopathy    Debility 09/04/2015   DM type 2 with diabetic peripheral neuropathy (HCC)    S/P BKA (below knee amputation) unilateral (HCC)    Medical non-compliance    Benign skin lesion of multiple sites    Tachypnea    Acute blood loss anemia    Neurogenic bladder    Occult blood positive stool    Rectovaginal fistula    Controlled diabetes mellitus type 2 with complications (HCC)    Acute on chronic  renal failure (HCC)    Hydronephrosis determined by ultrasound    Papular rash    Uncontrolled type 2 diabetes mellitus with complication (HCC)    Paresthesia    Thrombocytopenia (HCC) 08/23/2015   Pressure ulcer 08/23/2015   Severe anemia 08/23/2015   Lower urinary tract infectious disease 08/23/2015   Absolute anemia 08/23/2015   Peripheral vascular disease, unspecified (HCC) 04/27/2012   Unilateral complete BKA (HCC) 07/09/2011   Gas gangrene (HCC) 07/02/2011   Sepsis(995.91) 07/02/2011   Acute renal failure (HCC) 07/02/2011   Diabetes mellitus (HCC) 11/22/2010   Diabetic foot ulcer associated with type  2 diabetes mellitus (HCC)    Essential hypertension    Arthritis    Neuropathy (HCC)    Intracerebral hemorrhage (HCC)     Past Surgical History:  Procedure Laterality Date   AMPUTATION  11/24/2010   Procedure: AMPUTATION FOOT;  Surgeon: [name], MD;  Location: [facility] OR;  Service: Orthopedics;  Laterality: Right;  Right  FOOT TRANS METATARSAL AMPUTATION   AMPUTATION  07/02/2011   Procedure: AMPUTATION BELOW KNEE;  Surgeon: [name], MD;  Location: [facility] OR;  Service: Orthopedics;  Laterality: Right;   APPLICATION OF WOUND VAC  07/02/2011   Procedure: APPLICATION OF WOUND VAC;  Surgeon: [name], MD;  Location: [facility] OR;  Service: Orthopedics;  Laterality: Right;   BRAIN SURGERY  1980's   Previous brain surgery in [place], [place]   CESAREAN SECTION  2004; 2006   COLONOSCOPY N/A 09/17/2015   Procedure: COLONOSCOPY;  Surgeon: Charlott Rakes, MD;  Location: [facility] ENDOSCOPY;  Service: Endoscopy;  Laterality: N/A;   ESOPHAGOGASTRODUODENOSCOPY N/A 09/17/2015   Procedure: ESOPHAGOGASTRODUODENOSCOPY (EGD);  Surgeon: Charlott Rakes, MD;  Location: [facility] ENDOSCOPY;  Service: Endoscopy;  Laterality: N/A;   I & D EXTREMITY  11/24/2010   Procedure: IRRIGATION AND DEBRIDEMENT EXTREMITY;  Surgeon: [name], MD;  Location: [facility] OR;  Service: Orthopedics;  Laterality: Left;  Debriedment of left plantar ulcer and trimming of toenails   I & D EXTREMITY  07/02/2011   Procedure: IRRIGATION AND DEBRIDEMENT EXTREMITY;  Surgeon: [name], MD;  Location: [facility] OR;  Service: Orthopedics;  Laterality: Left;  I & D of Left Foot   I & D EXTREMITY  07/06/2011   Procedure: IRRIGATION AND DEBRIDEMENT EXTREMITY;  Surgeon: [name], MD;  Location: [facility] OR;  Service: Orthopedics;  Laterality: Right;  IRRIGATION/DEBRIDEMENT RIGHT BELOW KNEE AMPUTATION   TUBAL LIGATION  2006     OB History   No obstetric history on file.     Family History  Problem Relation Age of Onset   Diabetes Mother    Hypertension  Mother    Hyperlipidemia Mother    Diabetes Father    Cancer Father        prostate   Hyperlipidemia Father    Hypertension Father    Diabetes Brother    Peripheral Artery Disease Brother        has had toes amputated   Diabetes Son     Social History   Tobacco Use   Smoking status: Never Smoker   Smokeless tobacco: Never Used  Substance Use Topics   Alcohol use: Yes    Alcohol/week: 2.0 standard drinks    Types: 2 Glasses of wine per week   Drug use: No    Home Medications Prior to Admission medications   Medication Sig Start Date End Date Taking? Authorizing Provider  amLODipine (NORVASC) 10 MG tablet Take 1 tablet (10 mg total) by mouth daily.  Patient not taking: Reported on 12/15/2018 11/14/18   Rhetta Mura, MD  calcium carbonate (TUMS - DOSED IN MG ELEMENTAL CALCIUM) 500 MG chewable tablet Chew 2 tablets (400 mg of elemental calcium total) by mouth 3 (three) times daily. 09/18/15   Love, Evlyn Kanner, PA-C  cholecalciferol 5000 units TABS Take 1 tablet (5,000 Units total) by mouth daily. Patient not taking: Reported on 10/16/2018 03/08/17   Montez Morita, PA-C  eltrombopag (PROMACTA) 25 MG tablet Take 1 tablet (25 mg total) by mouth daily. Take on an empty stomach, 1 hour before a meal or 2 hours after. 01/01/19   Serena Croissant, MD  gabapentin (NEURONTIN) 400 MG capsule Take 1 capsule (400 mg total) by mouth 3 (three) times daily. Take 400 mg by mouth two times a day, and an additional 400 mg as needed for nerve pain Patient taking differently: Take 400 mg by mouth 3 (three) times daily.  10/16/18   Julieanne Manson, MD  metFORMIN (GLUCOPHAGE) 500 MG tablet Take 1 tablet (500 mg total) by mouth 2 (two) times daily with a meal. 11/13/18 01/12/19  Rhetta Mura, MD  tamsulosin (FLOMAX) 0.4 MG CAPS capsule Take 1 capsule (0.4 mg total) by mouth daily. 11/14/18   Rhetta Mura, MD    Allergies    Patient has no known allergies.  Review of Systems    Review of Systems  Constitutional: Negative for fever.  HENT: Negative for congestion, rhinorrhea, sneezing and sore throat.   Respiratory: Negative for cough and shortness of breath.   Cardiovascular: Negative for chest pain.  Gastrointestinal: Negative for abdominal pain, constipation, diarrhea, nausea and vomiting.  Musculoskeletal: Positive for myalgias.  Skin: Negative for rash and wound.  Allergic/Immunologic: Positive for immunocompromised state.  Neurological: Negative for weakness.  Psychiatric/Behavioral: Negative for confusion.  All other systems reviewed and are negative.   Physical Exam Updated Vital Signs BP (!) 110/98    Pulse 91    Temp 98.3 F (36.8 C) (Oral)    Resp (!) 32    LMP  (LMP Unknown)    SpO2 97%   Physical Exam Vitals and nursing note reviewed.  Constitutional:      General: She is not in acute distress.    Appearance: She is well-developed. She is not diaphoretic.  HENT:     Head: Normocephalic and atraumatic.  Cardiovascular:     Rate and Rhythm: Normal rate and regular rhythm.     Pulses: Normal pulses.     Heart sounds: Normal heart sounds.  Pulmonary:     Effort: Pulmonary effort is normal.     Breath sounds: Normal breath sounds.  Chest:     Chest wall: Tenderness present.       Comments: Tenderness to right lower mid axillary chest wall there is no ecchymosis, no crepitus, no lesions to suspect shingles Abdominal:     Palpations: Abdomen is soft.     Tenderness: There is no abdominal tenderness.  Musculoskeletal:     Left lower leg: No edema.     Comments: Right BKA  Skin:    General: Skin is warm and dry.     Findings: No erythema or rash.  Neurological:     Mental Status: She is alert and oriented to person, place, and time.  Psychiatric:        Behavior: Behavior normal.     ED Results / Procedures / Treatments   Labs (all labs ordered are listed, but only abnormal results are displayed) Labs Reviewed  BASIC METABOLIC  PANEL - Abnormal; Notable for the following components:      Result Value   Sodium 133 (*)    CO2 20 (*)    Glucose, Bld 122 (*)    BUN 40 (*)    Creatinine, Ser 1.50 (*)    Calcium 8.7 (*)    GFR calc non Af Amer 40 (*)    GFR calc Af Amer 46 (*)    All other components within normal limits  CBC WITH DIFFERENTIAL/PLATELET - Abnormal; Notable for the following components:   RBC 3.61 (*)    Hemoglobin 10.8 (*)    HCT 34.6 (*)    RDW 18.7 (*)    Platelets 39 (*)    nRBC 0.5 (*)    All other components within normal limits    EKG None  Radiology DG Chest 2 View  Result Date: 02/13/2019 CLINICAL DATA:  RIGHT-side chest and rib pain for 1 day, history hypertension, diabetes mellitus EXAM: CHEST - 2 VIEW COMPARISON:  03/06/2017 FINDINGS: Enlargement of cardiac silhouette with pulmonary vascular congestion. RIGHT lower lobe infiltrate consistent with pneumonia. Remaining lungs grossly clear. No pleural effusion or pneumothorax. Bones unremarkable. IMPRESSION: Enlargement of cardiac silhouette with pulmonary vascular congestion. RIGHT lower lobe infiltrate consistent with pneumonia. Electronically Signed   By: Ulyses Southward M.D.   On: 02/13/2019 16:57   CT Angio Chest PE W/Cm &/Or Wo Cm  Result Date: 02/13/2019 CLINICAL DATA:  Right-sided chest wall pain radiating to right shoulder EXAM: CT ANGIOGRAPHY CHEST WITH CONTRAST TECHNIQUE: Multidetector CT imaging of the chest was performed using the standard protocol during bolus administration of intravenous contrast. Multiplanar CT image reconstructions and MIPs were obtained to evaluate the vascular anatomy. CONTRAST:  OMNIPAQUE IOHEXOL 350 MG/ML SOLN COMPARISON:  03/07/2017, 02/13/2019 FINDINGS: Cardiovascular: This is a technically adequate evaluation of the pulmonary vasculature. No filling defects or pulmonary emboli. The heart is stable. Trace pericardial fluid. Thoracic aorta is unremarkable. Mediastinum/Nodes: Left lobe thyroid nodule  measures 4.6 x 4.4 cm, previously evaluated by ultrasound and recommended for biopsy. Please correlate with previous biopsy results. No pathologically enlarged mediastinal or hilar adenopathy. Lungs/Pleura: There is a small right pleural effusion volume estimated less than 500 cc. Central vascular congestion is noted. No acute airspace disease or pneumothorax. Central airways are patent. Upper Abdomen: No acute abnormality. Musculoskeletal: No acute or destructive bony lesions. Reconstructed images demonstrate no additional findings. Review of the MIP images confirms the above findings. IMPRESSION: 1. No evidence pulmonary embolus. 2. Central vascular congestion and small right pleural effusion. No overt edema. 3. Progressive enlargement of the left lobe thyroid nodule as above. Previous ultrasound 03/08/2017 was performed, with biopsy recommended at that time. Please correlate with any biopsy results. Electronically Signed   By: Sharlet Salina M.D.   On: 02/13/2019 20:33    Procedures Procedures (including critical care time)  Medications Ordered in ED Medications  lidocaine (LIDODERM) 5 % 1 patch (1 patch Transdermal Patch Applied 02/13/19 2103)  iohexol (OMNIPAQUE) 350 MG/ML injection 100 mL (100 mLs Intravenous Contrast Given 02/13/19 2026)    ED Course  I have reviewed the triage vital signs and the nursing notes.  Pertinent labs & imaging results that were available during my care of the patient were reviewed by me and considered in my medical decision making (see chart for details).  Clinical Course as of Feb 13 2103  Tue Feb 13, 2019  [id number] 54 year old female with complaint of right mid axillary lower  rib pain x2 days.  Chest x-ray obtained in triage concerning for right lower lobe pneumonia.  Patient does not have any recent URI symptoms or URI symptoms currently.  Initial O2 sat upon arrival in triage of 88%, patient was monitored in the emergency room and maintained O2 sat of 96% or  greater. Pain is clearly reproduced with palpation of the area.  I do not see any lesions to suspect shingles, there is no history of fall, no overlying bruising. CT chest obtained to follow-up for pneumonia on chest x-ray, shows small pleural effusion.  BMP without significant changes from prior.  CBC with platelets of 39,000.  Patient is followed by hematology, discussed results with her, she does not have any bleeding currently, has not had any falls.  Patient will call her hematologist in the morning to discuss treatment. Incidental finding on CT of thyroid nodule, enlarged from previous study which had recommended biopsy of area.  Patient thinks that she did have a biopsy of the area but does not recall the results.  Recommend patient follow-up with her PCP for this.  Patient verbalizes understanding of her discharge instructions and plan.  Patient was given a Lidoderm patch for her pain prior to discharge.   [LM]    Clinical Course User Index [LM] Roque Lias   MDM Rules/Calculators/A&P                      Final Clinical Impression(s) / ED Diagnoses Final diagnoses:  Chest wall pain  Idiopathic thrombocytopenic purpura (ITP) (HCC)  Thyroid nodule    Rx / DC Orders ED Discharge Orders    None       Roque Lias 02/13/19 2105    Carmin Muskrat, MD 02/14/19 [phone]

## 2019-02-13 NOTE — Discharge Instructions (Addendum)
Call your hematologist in the morning to discuss your low platelet count today. Follow-up with your primary care doctor, there is a nodule on your thyroid that is slightly larger than on previous study, this should have been biopsied.  You are not sure if this was biopsied at time of your ER visit today.

## 2019-02-13 NOTE — ED Triage Notes (Signed)
Pt here for evaluation of pain in rib cage (right side)  that radiates to right shoulder that began over weekend. Pt denies any injury or trauma.  Pt also denies CP.  No other symptoms noted.

## 2019-03-05 NOTE — Progress Notes (Signed)
Patient Care Team: Mack Hook, MD as PCP - General (Internal Medicine)  DIAGNOSIS:    ICD-10-CM   1. Acute ITP (HCC)  D69.3     CHIEF COMPLIANT: Follow-up of relapsed ITP on Promacta   INTERVAL HISTORY: [name] is a 54 y.o. with above-mentioned history of severe thrombocytopeniacurrently on Promacta. She presents to the clinic todayfor a toxicity check.  Last month she had blood work that showed a platelet count of 35.  Today her platelet count is down to 8.  She has noticed increased bruising on her chest wall.  ALLERGIES:  has No Known Allergies.  MEDICATIONS:  Current Outpatient Medications  Medication Sig Dispense Refill  . amLODipine (NORVASC) 10 MG tablet Take 1 tablet (10 mg total) by mouth daily. (Patient not taking: Reported on 12/15/2018) 30 tablet 1  . calcium carbonate (TUMS - DOSED IN MG ELEMENTAL CALCIUM) 500 MG chewable tablet Chew 2 tablets (400 mg of elemental calcium total) by mouth 3 (three) times daily. 180 tablet 0  . cholecalciferol 5000 units TABS Take 1 tablet (5,000 Units total) by mouth daily. (Patient not taking: Reported on 10/16/2018)    . eltrombopag (PROMACTA) 50 MG tablet Take 1 tablet (50 mg total) by mouth daily. Take on an empty stomach, 1 hour before a meal or 2 hours after. 90 tablet 3  . gabapentin (NEURONTIN) 400 MG capsule Take 1 capsule (400 mg total) by mouth 3 (three) times daily. Take 400 mg by mouth two times a day, and an additional 400 mg as needed for nerve pain (Patient taking differently: Take 400 mg by mouth 3 (three) times daily. ) 90 capsule 11  . metFORMIN (GLUCOPHAGE) 500 MG tablet Take 1 tablet (500 mg total) by mouth 2 (two) times daily with a meal. 60 tablet 1  . tamsulosin (FLOMAX) 0.4 MG CAPS capsule Take 1 capsule (0.4 mg total) by mouth daily. 30 capsule 3   No current facility-administered medications for this visit.    PHYSICAL EXAMINATION: ECOG PERFORMANCE STATUS: 2 - Symptomatic, <50% confined to  bed  Vitals:   03/06/19 1427  BP: (!) 141/68  Pulse: 80  Resp: 18  Temp: 98.6 F (37 C)  SpO2: 93%   Filed Weights    LABORATORY DATA:  I have reviewed the data as listed CMP Latest Ref Rng & Units 02/13/2019 02/05/2019 01/22/2019  Glucose 70 - 99 mg/dL 122(H) 163(H) 251(H)  BUN 6 - 20 mg/dL 40(H) 35(H) 19  Creatinine 0.44 - 1.00 mg/dL 1.50(H) 1.74(H) 1.26(H)  Sodium 135 - 145 mmol/L 133(L) 134(L) 137  Potassium 3.5 - 5.1 mmol/L 4.9 5.0 5.3(H)  Chloride 98 - 111 mmol/L 102 105 105  CO2 22 - 32 mmol/L 20(L) 17(L) 21(L)  Calcium 8.9 - 10.3 mg/dL 8.7(L) 8.1(L) 8.3(L)  Total Protein 6.5 - 8.1 g/dL - 8.5(H) 7.7  Total Bilirubin 0.3 - 1.2 mg/dL - 0.3 0.4  Alkaline Phos 38 - 126 U/L - 73 83  AST 15 - 41 U/L - 40 46(H)  ALT 0 - 44 U/L - 38 43    Lab Results  Component Value Date   WBC 5.0 03/06/2019   HGB 9.6 (L) 03/06/2019   HCT 32.7 (L) 03/06/2019   MCV 95.1 03/06/2019   PLT 8 (LL) 03/06/2019   NEUTROABS 3.3 03/06/2019    ASSESSMENT & PLAN:  Acute ITP (HCC) Acute exacerbation of ITP: 11/07/2018: Dexamethasone and IVIG administered: Resolved Relapsed ITP 12/18/2018 Current treatment:Promacta 50 mg dailystarted 01/01/2019 (did not  respond until she received IVIG) IVIG treatment 01/24/2019 and 01/25/2019   Lab review: Today's platelet count is 8 I recommend that she received 2 doses of IVIG sometime this week. We decided not to use steroids because of her diabetes.  I sent a prescription for Promacta increasing the dosage to 50 mg daily.  Return to clinic in 4 weeks with labs and follow-up   No orders of the defined types were placed in this encounter.  The patient has a good understanding of the overall plan. she agrees with it. she will call with any problems that may develop before the next visit here.  Total time spent: 30 mins including face to face time and time spent for planning, charting and coordination of care  Serena Croissant, MD 03/06/2019  I, Kirt Boys  Dorshimer, am acting as scribe for Dr. Serena Croissant.  I have reviewed the above documentation for accuracy and completeness, and I agree with the above.

## 2019-03-06 ENCOUNTER — Inpatient Hospital Stay: Payer: Self-pay | Attending: Hematology and Oncology

## 2019-03-06 ENCOUNTER — Other Ambulatory Visit: Payer: Self-pay

## 2019-03-06 ENCOUNTER — Inpatient Hospital Stay (HOSPITAL_BASED_OUTPATIENT_CLINIC_OR_DEPARTMENT_OTHER): Payer: Self-pay | Admitting: Hematology and Oncology

## 2019-03-06 DIAGNOSIS — Z89511 Acquired absence of right leg below knee: Secondary | ICD-10-CM | POA: Insufficient documentation

## 2019-03-06 DIAGNOSIS — E1136 Type 2 diabetes mellitus with diabetic cataract: Secondary | ICD-10-CM | POA: Insufficient documentation

## 2019-03-06 DIAGNOSIS — Z7984 Long term (current) use of oral hypoglycemic drugs: Secondary | ICD-10-CM | POA: Insufficient documentation

## 2019-03-06 DIAGNOSIS — M1990 Unspecified osteoarthritis, unspecified site: Secondary | ICD-10-CM | POA: Insufficient documentation

## 2019-03-06 DIAGNOSIS — D693 Immune thrombocytopenic purpura: Secondary | ICD-10-CM

## 2019-03-06 DIAGNOSIS — N183 Chronic kidney disease, stage 3 unspecified: Secondary | ICD-10-CM | POA: Insufficient documentation

## 2019-03-06 DIAGNOSIS — E1122 Type 2 diabetes mellitus with diabetic chronic kidney disease: Secondary | ICD-10-CM | POA: Insufficient documentation

## 2019-03-06 DIAGNOSIS — I129 Hypertensive chronic kidney disease with stage 1 through stage 4 chronic kidney disease, or unspecified chronic kidney disease: Secondary | ICD-10-CM | POA: Insufficient documentation

## 2019-03-06 DIAGNOSIS — I739 Peripheral vascular disease, unspecified: Secondary | ICD-10-CM | POA: Insufficient documentation

## 2019-03-06 DIAGNOSIS — R600 Localized edema: Secondary | ICD-10-CM | POA: Insufficient documentation

## 2019-03-06 DIAGNOSIS — Z79899 Other long term (current) drug therapy: Secondary | ICD-10-CM | POA: Insufficient documentation

## 2019-03-06 DIAGNOSIS — E1121 Type 2 diabetes mellitus with diabetic nephropathy: Secondary | ICD-10-CM | POA: Insufficient documentation

## 2019-03-06 LAB — CBC WITH DIFFERENTIAL (CANCER CENTER ONLY)
Abs Immature Granulocytes: 0.03 10*3/uL (ref 0.00–0.07)
Basophils Absolute: 0 10*3/uL (ref 0.0–0.1)
Basophils Relative: 0 %
Eosinophils Absolute: 0.1 10*3/uL (ref 0.0–0.5)
Eosinophils Relative: 1 %
HCT: 32.7 % — ABNORMAL LOW (ref 36.0–46.0)
Hemoglobin: 9.6 g/dL — ABNORMAL LOW (ref 12.0–15.0)
Immature Granulocytes: 1 %
Lymphocytes Relative: 21 %
Lymphs Abs: 1.1 10*3/uL (ref 0.7–4.0)
MCH: 27.9 pg (ref 26.0–34.0)
MCHC: 29.4 g/dL — ABNORMAL LOW (ref 30.0–36.0)
MCV: 95.1 fL (ref 80.0–100.0)
Monocytes Absolute: 0.5 10*3/uL (ref 0.1–1.0)
Monocytes Relative: 11 %
Neutro Abs: 3.3 10*3/uL (ref 1.7–7.7)
Neutrophils Relative %: 66 %
Platelet Count: 8 10*3/uL — CL (ref 150–400)
RBC: 3.44 MIL/uL — ABNORMAL LOW (ref 3.87–5.11)
RDW: 18.6 % — ABNORMAL HIGH (ref 11.5–15.5)
WBC Count: 5 10*3/uL (ref 4.0–10.5)
nRBC: 0 % (ref 0.0–0.2)

## 2019-03-06 MED ORDER — ELTROMBOPAG OLAMINE 50 MG PO TABS: 50.0000 mg | ORAL_TABLET | Freq: Every day | ORAL | 3 refills | Status: DC

## 2019-03-06 NOTE — Assessment & Plan Note (Signed)
Acute exacerbation of ITP: 11/07/2018: Dexamethasone and IVIG administered: Resolved Relapsed ITP 12/18/2018 Current treatment:Promacta 50 mg dailystarted 01/01/2019 (did not respond) IVIG treatment 01/24/2019 and 01/25/2019 I recommended continuation of Promacta.  Lab review: Today's platelet count is 120 Return to clinic in 4 weeks with labs and follow-up

## 2019-03-06 NOTE — Progress Notes (Signed)
CRITICAL VALUE ALERT  Critical Value:  Plt 8  Date & Time Notied:  03/06/19 at 1432  Provider Notified: Serena Croissant, MD  Orders Received/Actions taken: MD notified

## 2019-03-07 ENCOUNTER — Telehealth: Payer: Self-pay | Admitting: Adult Health

## 2019-03-07 NOTE — Telephone Encounter (Signed)
I cancel IVIG look like it had already been scheduled

## 2019-03-09 ENCOUNTER — Inpatient Hospital Stay: Payer: Self-pay

## 2019-03-09 ENCOUNTER — Other Ambulatory Visit: Payer: Self-pay

## 2019-03-09 VITALS — BP 162/80 | HR 87 | Temp 97.8°F | Resp 18

## 2019-03-09 DIAGNOSIS — D693 Immune thrombocytopenic purpura: Secondary | ICD-10-CM

## 2019-03-09 MED ORDER — ACETAMINOPHEN 325 MG PO TABS
ORAL_TABLET | ORAL | Status: AC
  Filled 2019-03-09: qty 2

## 2019-03-09 MED ORDER — DEXTROSE 5 % IV SOLN
Freq: Once | INTRAVENOUS | Status: AC
  Filled 2019-03-09: qty 250

## 2019-03-09 MED ORDER — DIPHENHYDRAMINE HCL 25 MG PO CAPS
ORAL_CAPSULE | ORAL | Status: AC
  Filled 2019-03-09: qty 1

## 2019-03-09 MED ORDER — IMMUNE GLOBULIN (HUMAN) 10 GM/100ML IV SOLN
100.0000 g | Freq: Once | INTRAVENOUS | Status: AC
  Administered 2019-03-09: 100 g via INTRAVENOUS
  Filled 2019-03-09: qty 800

## 2019-03-09 MED ORDER — ACETAMINOPHEN 325 MG PO TABS
650.0000 mg | ORAL_TABLET | Freq: Once | ORAL | Status: AC
  Administered 2019-03-09: 09:00:00 650 mg via ORAL

## 2019-03-09 MED ORDER — DIPHENHYDRAMINE HCL 25 MG PO TABS
25.0000 mg | ORAL_TABLET | Freq: Once | ORAL | Status: AC
  Administered 2019-03-09: 09:00:00 25 mg via ORAL
  Filled 2019-03-09: qty 1

## 2019-03-09 NOTE — Patient Instructions (Signed)
Immune Globulin Injection What is this medicine? IMMUNE GLOBULIN (im MUNE GLOB yoo lin) helps to prevent or reduce the severity of certain infections in patients who are at risk. This medicine is collected from the pooled blood of many donors. It is used to treat immune system problems, thrombocytopenia, and Kawasaki syndrome. This medicine may be used for other purposes; ask your health care provider or pharmacist if you have questions. COMMON BRAND NAME(S): ASCENIV, Baygam, BIVIGAM, Carimune, Carimune NF, cutaquig, Cuvitru, Flebogamma, Flebogamma DIF, GamaSTAN, GamaSTAN S/D, Gamimune N, Gammagard, Gammagard S/D, Gammaked, Gammaplex, Gammar-P IV, Gamunex, Gamunex-C, Hizentra, Iveegam, Iveegam EN, Octagam, Panglobulin, Panglobulin NF, panzyga, Polygam S/D, Privigen, Sandoglobulin, Venoglobulin-S, Vigam, Vivaglobulin, Xembify What should I tell my health care provider before I take this medicine? They need to know if you have any of these conditions:  diabetes  extremely low or no immune antibodies in the blood  heart disease  history of blood clots  hyperprolinemia  infection in the blood, sepsis  kidney disease  recently received or scheduled to receive a vaccination  an unusual or allergic reaction to human immune globulin, albumin, maltose, sucrose, other medicines, foods, dyes, or preservatives  pregnant or trying to get pregnant  breast-feeding How should I use this medicine? This medicine is for injection into a muscle or infusion into a vein or skin. It is usually given by a health care professional in a hospital or clinic setting. In rare cases, some brands of this medicine might be given at home. You will be taught how to give this medicine. Use exactly as directed. Take your medicine at regular intervals. Do not take your medicine more often than directed. Talk to your pediatrician regarding the use of this medicine in children. While this drug may be prescribed for selected  conditions, precautions do apply. Overdosage: If you think you have taken too much of this medicine contact a poison control center or emergency room at once. NOTE: This medicine is only for you. Do not share this medicine with others. What if I miss a dose? It is important not to miss your dose. Call your doctor or health care professional if you are unable to keep an appointment. If you give yourself the medicine and you miss a dose, take it as soon as you can. If it is almost time for your next dose, take only that dose. Do not take double or extra doses. What may interact with this medicine?  aspirin and aspirin-like medicines  cisplatin  cyclosporine  medicines for infection like acyclovir, adefovir, amphotericin B, bacitracin, cidofovir, foscarnet, ganciclovir, gentamicin, pentamidine, vancomycin  NSAIDS, medicines for pain and inflammation, like ibuprofen or naproxen  pamidronate  vaccines  zoledronic acid This list may not describe all possible interactions. Give your health care provider a list of all the medicines, herbs, non-prescription drugs, or dietary supplements you use. Also tell them if you smoke, drink alcohol, or use illegal drugs. Some items may interact with your medicine. What should I watch for while using this medicine? Your condition will be monitored carefully while you are receiving this medicine. This medicine is made from pooled blood donations of many different people. It may be possible to pass an infection in this medicine. However, the donors are screened for infections and all products are tested for HIV and hepatitis. The medicine is treated to kill most or all bacteria and viruses. Talk to your doctor about the risks and benefits of this medicine. Do not have vaccinations for at least   14 days before, or until at least 3 months after receiving this medicine. What side effects may I notice from receiving this medicine? Side effects that you should report  to your doctor or health care professional as soon as possible:  allergic reactions like skin rash, itching or hives, swelling of the face, lips, or tongue  blue colored lips or skin  breathing problems  chest pain or tightness  fever  signs and symptoms of aseptic meningitis such as stiff neck; sensitivity to light; headache; drowsiness; fever; nausea; vomiting; rash  signs and symptoms of a blood clot such as chest pain; shortness of breath; pain, swelling, or warmth in the leg  signs and symptoms of hemolytic anemia such as fast heartbeat; tiredness; dark yellow or brown urine; or yellowing of the eyes or skin  signs and symptoms of kidney injury like trouble passing urine or change in the amount of urine  sudden weight gain  swelling of the ankles, feet, hands Side effects that usually do not require medical attention (report to your doctor or health care professional if they continue or are bothersome):  diarrhea  flushing  headache  increased sweating  joint pain  muscle cramps  muscle pain  nausea  pain, redness, or irritation at site where injected  tiredness This list may not describe all possible side effects. Call your doctor for medical advice about side effects. You may report side effects to FDA at 1-800-FDA-1088. Where should I keep my medicine? Keep out of the reach of children. This drug is usually given in a hospital or clinic and will not be stored at home. In rare cases, some brands of this medicine may be given at home. If you are using this medicine at home, you will be instructed on how to store this medicine. Throw away any unused medicine after the expiration date on the label. NOTE: This sheet is a summary. It may not cover all possible information. If you have questions about this medicine, talk to your doctor, pharmacist, or health care provider.  2020 Elsevier/Gold Standard (2018-07-26 12:51:14)  

## 2019-03-12 ENCOUNTER — Other Ambulatory Visit: Payer: Self-pay

## 2019-03-12 ENCOUNTER — Inpatient Hospital Stay: Payer: Self-pay

## 2019-03-12 VITALS — BP 159/73 | HR 84 | Temp 98.2°F | Resp 17

## 2019-03-12 DIAGNOSIS — D693 Immune thrombocytopenic purpura: Secondary | ICD-10-CM

## 2019-03-12 MED ORDER — ACETAMINOPHEN 325 MG PO TABS
ORAL_TABLET | ORAL | Status: AC
  Filled 2019-03-12: qty 2

## 2019-03-12 MED ORDER — ACETAMINOPHEN 325 MG PO TABS
650.0000 mg | ORAL_TABLET | Freq: Once | ORAL | Status: AC
  Administered 2019-03-12: 12:00:00 650 mg via ORAL

## 2019-03-12 MED ORDER — IMMUNE GLOBULIN (HUMAN) 20 GM/200ML IV SOLN
100.0000 g | Freq: Once | INTRAVENOUS | Status: AC
  Administered 2019-03-12: 100 g via INTRAVENOUS
  Filled 2019-03-12: qty 800

## 2019-03-12 NOTE — Progress Notes (Addendum)
At approx 1122 dynamap alarm alerted nurse to low oxygen saturation of 77%.   Pt at max rate for IVIG infusion - 249 mls/hr Pt had previously been sleeping with oxygen saturations between 87-90%. Unable to arouse pt by shoulder shaking and sternal rub.  Pt with strong carotid pulse but appeared to have shallow respirations.  Per dynamap pt's oxygen saturation dropped to 50%.  Supplemental O2 applied and code 4 called.  Pt then spontaneously opened eyes and started talking.  Dr Mosetta Putt at chair side and instructed to stop IVIG for now. Pt stated that she could here people talking to her but that she was unable to speak.   Pulse oximeter reading 98-100% on supplemental oxygen and pt fully alert. Dr Pamelia Hoit made aware of situation. At 1140 VSS. Pt's oxygen saturation continues to periodically drop to 87-89% when she doses off.  Pt states that she does not wear oxygen at home but was told "about a year ago that I needed oxygen".  Per Dr Pamelia Hoit give tylenol and restart IVIG in 30 minutes.  IVIG restarted at for 15 minutes.  Next bump up will be max rate of 249 mls/hr  1405:  Pt tolerating IVIG without complication.  Pulse oximeter continues to show oxygen desaturation as low as 77% when pt is sleeping.  Pt is easily aroused and oriented on awakening and oxygen saturation immediately recovers to >90%   Above has been reviewed with Dr Pamelia Hoit.  Per Dr Pamelia Hoit pt will be set up for sleep study.

## 2019-03-12 NOTE — Patient Instructions (Signed)
Immune Globulin Injection What is this medicine? IMMUNE GLOBULIN (im MUNE GLOB yoo lin) helps to prevent or reduce the severity of certain infections in patients who are at risk. This medicine is collected from the pooled blood of many donors. It is used to treat immune system problems, thrombocytopenia, and Kawasaki syndrome. This medicine may be used for other purposes; ask your health care provider or pharmacist if you have questions. COMMON BRAND NAME(S): ASCENIV, Baygam, BIVIGAM, Carimune, Carimune NF, cutaquig, Cuvitru, Flebogamma, Flebogamma DIF, GamaSTAN, GamaSTAN S/D, Gamimune N, Gammagard, Gammagard S/D, Gammaked, Gammaplex, Gammar-P IV, Gamunex, Gamunex-C, Hizentra, Iveegam, Iveegam EN, Octagam, Panglobulin, Panglobulin NF, panzyga, Polygam S/D, Privigen, Sandoglobulin, Venoglobulin-S, Vigam, Vivaglobulin, Xembify What should I tell my health care provider before I take this medicine? They need to know if you have any of these conditions:  diabetes  extremely low or no immune antibodies in the blood  heart disease  history of blood clots  hyperprolinemia  infection in the blood, sepsis  kidney disease  recently received or scheduled to receive a vaccination  an unusual or allergic reaction to human immune globulin, albumin, maltose, sucrose, other medicines, foods, dyes, or preservatives  pregnant or trying to get pregnant  breast-feeding How should I use this medicine? This medicine is for injection into a muscle or infusion into a vein or skin. It is usually given by a health care professional in a hospital or clinic setting. In rare cases, some brands of this medicine might be given at home. You will be taught how to give this medicine. Use exactly as directed. Take your medicine at regular intervals. Do not take your medicine more often than directed. Talk to your pediatrician regarding the use of this medicine in children. While this drug may be prescribed for selected  conditions, precautions do apply. Overdosage: If you think you have taken too much of this medicine contact a poison control center or emergency room at once. NOTE: This medicine is only for you. Do not share this medicine with others. What if I miss a dose? It is important not to miss your dose. Call your doctor or health care professional if you are unable to keep an appointment. If you give yourself the medicine and you miss a dose, take it as soon as you can. If it is almost time for your next dose, take only that dose. Do not take double or extra doses. What may interact with this medicine?  aspirin and aspirin-like medicines  cisplatin  cyclosporine  medicines for infection like acyclovir, adefovir, amphotericin B, bacitracin, cidofovir, foscarnet, ganciclovir, gentamicin, pentamidine, vancomycin  NSAIDS, medicines for pain and inflammation, like ibuprofen or naproxen  pamidronate  vaccines  zoledronic acid This list may not describe all possible interactions. Give your health care provider a list of all the medicines, herbs, non-prescription drugs, or dietary supplements you use. Also tell them if you smoke, drink alcohol, or use illegal drugs. Some items may interact with your medicine. What should I watch for while using this medicine? Your condition will be monitored carefully while you are receiving this medicine. This medicine is made from pooled blood donations of many different people. It may be possible to pass an infection in this medicine. However, the donors are screened for infections and all products are tested for HIV and hepatitis. The medicine is treated to kill most or all bacteria and viruses. Talk to your doctor about the risks and benefits of this medicine. Do not have vaccinations for at least   14 days before, or until at least 3 months after receiving this medicine. What side effects may I notice from receiving this medicine? Side effects that you should report  to your doctor or health care professional as soon as possible:  allergic reactions like skin rash, itching or hives, swelling of the face, lips, or tongue  blue colored lips or skin  breathing problems  chest pain or tightness  fever  signs and symptoms of aseptic meningitis such as stiff neck; sensitivity to light; headache; drowsiness; fever; nausea; vomiting; rash  signs and symptoms of a blood clot such as chest pain; shortness of breath; pain, swelling, or warmth in the leg  signs and symptoms of hemolytic anemia such as fast heartbeat; tiredness; dark yellow or brown urine; or yellowing of the eyes or skin  signs and symptoms of kidney injury like trouble passing urine or change in the amount of urine  sudden weight gain  swelling of the ankles, feet, hands Side effects that usually do not require medical attention (report to your doctor or health care professional if they continue or are bothersome):  diarrhea  flushing  headache  increased sweating  joint pain  muscle cramps  muscle pain  nausea  pain, redness, or irritation at site where injected  tiredness This list may not describe all possible side effects. Call your doctor for medical advice about side effects. You may report side effects to FDA at 1-800-FDA-1088. Where should I keep my medicine? Keep out of the reach of children. This drug is usually given in a hospital or clinic and will not be stored at home. In rare cases, some brands of this medicine may be given at home. If you are using this medicine at home, you will be instructed on how to store this medicine. Throw away any unused medicine after the expiration date on the label. NOTE: This sheet is a summary. It may not cover all possible information. If you have questions about this medicine, talk to your doctor, pharmacist, or health care provider.  2020 Elsevier/Gold Standard (2018-07-26 12:51:14)  

## 2019-03-12 NOTE — Progress Notes (Signed)
SATURATION QUALIFICATIONS: (This note is used to comply with regulatory documentation for home oxygen)  Patient Saturations on Room Air at Rest = 87% while sleeping  Patient Saturations on Room Air while Ambulating = unable to ambulate d/t pt is right BKA  Patient Saturations on 2 Liters of oxygen while at rest is 98%   Please briefly explain why patient needs home oxygen:  Pt oxygen saturation drops to 87% while sleeping.

## 2019-03-13 ENCOUNTER — Other Ambulatory Visit: Payer: Self-pay

## 2019-03-13 DIAGNOSIS — D696 Thrombocytopenia, unspecified: Secondary | ICD-10-CM

## 2019-03-13 DIAGNOSIS — R0902 Hypoxemia: Secondary | ICD-10-CM

## 2019-03-20 ENCOUNTER — Encounter (HOSPITAL_COMMUNITY): Payer: Self-pay | Admitting: Emergency Medicine

## 2019-03-20 ENCOUNTER — Emergency Department (HOSPITAL_COMMUNITY): Payer: Self-pay

## 2019-03-20 ENCOUNTER — Other Ambulatory Visit: Payer: Self-pay

## 2019-03-20 ENCOUNTER — Emergency Department (HOSPITAL_COMMUNITY)
Admission: EM | Admit: 2019-03-20 | Discharge: 2019-03-20 | Disposition: A | Discharge status: Home / Self Care | Payer: Self-pay | Attending: Emergency Medicine | Admitting: Emergency Medicine

## 2019-03-20 DIAGNOSIS — E1122 Type 2 diabetes mellitus with diabetic chronic kidney disease: Secondary | ICD-10-CM | POA: Insufficient documentation

## 2019-03-20 DIAGNOSIS — Z79899 Other long term (current) drug therapy: Secondary | ICD-10-CM | POA: Insufficient documentation

## 2019-03-20 DIAGNOSIS — Z89511 Acquired absence of right leg below knee: Secondary | ICD-10-CM | POA: Insufficient documentation

## 2019-03-20 DIAGNOSIS — I129 Hypertensive chronic kidney disease with stage 1 through stage 4 chronic kidney disease, or unspecified chronic kidney disease: Secondary | ICD-10-CM | POA: Insufficient documentation

## 2019-03-20 DIAGNOSIS — R609 Edema, unspecified: Secondary | ICD-10-CM

## 2019-03-20 DIAGNOSIS — N183 Chronic kidney disease, stage 3 unspecified: Secondary | ICD-10-CM | POA: Insufficient documentation

## 2019-03-20 DIAGNOSIS — R600 Localized edema: Secondary | ICD-10-CM | POA: Insufficient documentation

## 2019-03-20 LAB — CBC
HCT: 33.8 % — ABNORMAL LOW (ref 36.0–46.0)
Hemoglobin: 9.8 g/dL — ABNORMAL LOW (ref 12.0–15.0)
MCH: 27.6 pg (ref 26.0–34.0)
MCHC: 29 g/dL — ABNORMAL LOW (ref 30.0–36.0)
MCV: 95.2 fL (ref 80.0–100.0)
Platelets: 197 10*3/uL (ref 150–400)
RBC: 3.55 MIL/uL — ABNORMAL LOW (ref 3.87–5.11)
RDW: 18.6 % — ABNORMAL HIGH (ref 11.5–15.5)
WBC: 4.3 10*3/uL (ref 4.0–10.5)
nRBC: 0 % (ref 0.0–0.2)

## 2019-03-20 LAB — BASIC METABOLIC PANEL
Anion gap: 11 (ref 5–15)
BUN: 49 mg/dL — ABNORMAL HIGH (ref 6–20)
CO2: 20 mmol/L — ABNORMAL LOW (ref 22–32)
Calcium: 8.9 mg/dL (ref 8.9–10.3)
Chloride: 106 mmol/L (ref 98–111)
Creatinine, Ser: 1.81 mg/dL — ABNORMAL HIGH (ref 0.44–1.00)
GFR calc Af Amer: 37 mL/min — ABNORMAL LOW (ref >60)
GFR calc non Af Amer: 32 mL/min — ABNORMAL LOW (ref >60)
Glucose, Bld: 110 mg/dL — ABNORMAL HIGH (ref 70–99)
Potassium: 5.2 mmol/L — ABNORMAL HIGH (ref 3.5–5.1)
Sodium: 137 mmol/L (ref 135–145)

## 2019-03-20 LAB — HEPATIC FUNCTION PANEL
ALT: 18 U/L (ref 0–44)
AST: 30 U/L (ref 15–41)
Albumin: 3.4 g/dL — ABNORMAL LOW (ref 3.5–5.0)
Alkaline Phosphatase: 69 U/L (ref 38–126)
Bilirubin, Direct: 0.1 mg/dL (ref 0.0–0.2)
Indirect Bilirubin: 0.5 mg/dL (ref 0.3–0.9)
Total Bilirubin: 0.6 mg/dL (ref 0.3–1.2)
Total Protein: 8.8 g/dL — ABNORMAL HIGH (ref 6.5–8.1)

## 2019-03-20 LAB — BRAIN NATRIURETIC PEPTIDE: B Natriuretic Peptide: 627.6 pg/mL — ABNORMAL HIGH (ref 0.0–100.0)

## 2019-03-20 MED ORDER — SODIUM CHLORIDE 0.9% FLUSH: 3.0000 mL | Freq: Once | INTRAVENOUS | Status: DC

## 2019-03-20 MED ORDER — FUROSEMIDE 20 MG PO TABS: 20.0000 mg | ORAL_TABLET | Freq: Every day | ORAL | 0 refills | Status: DC

## 2019-03-20 MED ORDER — FUROSEMIDE 20 MG PO TABS
80.0000 mg | ORAL_TABLET | Freq: Once | ORAL | Status: AC
  Administered 2019-03-20: 80 mg via ORAL
  Filled 2019-03-20: qty 4

## 2019-03-20 MED ORDER — FUROSEMIDE 10 MG/ML IJ SOLN: 40.0000 mg | Freq: Once | INTRAMUSCULAR | Status: DC

## 2019-03-20 NOTE — ED Notes (Signed)
Patient verbalizes understanding of discharge instructions. Opportunity for questioning and answers were provided. Pt discharged from ED. 

## 2019-03-20 NOTE — ED Triage Notes (Signed)
Pt arrives to ED from home with complaints of leg swelling. Pateint states that over the last month her legs have swollen so bad that it is now painful. Patient has BKA of right leg.

## 2019-03-20 NOTE — ED Notes (Signed)
Pt sating in the lower 80s, put 2L O2 on

## 2019-03-20 NOTE — Discharge Instructions (Addendum)
Follow up with your doctor.   It is not obvious that your problem is caused by your heart kidney or liver.  Take the medicine as prescribed to help you urinate more to lose fluid.  Also try and elevate these above the level of your heart as best you can.  You also need to follow-up with them with your sleep apnea to see if they want to provide you with equipment at home.

## 2019-03-20 NOTE — ED Provider Notes (Signed)
[name] EMERGENCY DEPARTMENT Provider Note   CSN: [id number] Arrival date & time: 03/20/19  1107     History Chief Complaint  Patient presents with  . Leg Swelling    [name] is a 54 y.o. female.  54 yo F with a chief complaint of lower extremity edema.  Going on for about 3 weeks now.  States that it hurts because they are so tense.  She has a history of sleep apnea so she think she has been sleeping sitting up little bit more often than normal.  She denies any chest pain or shortness of breath.  Denies abdominal pain.  Has not seen anyone for this.  Did have a problem like this in the past and she is not sure what the cause was.  Denies any issue with her kidney or liver.  No prior history of heart failure as far she knows.  She denies orthopnea or PND.  The history is provided by the patient.  Illness Severity:  Moderate Onset quality:  Gradual Duration:  3 weeks Timing:  Constant Progression:  Worsening Chronicity:  New Associated symptoms: no chest pain, no congestion, no fever, no headaches, no myalgias, no nausea, no rhinorrhea, no shortness of breath, no vomiting and no wheezing        Past Medical History:  Diagnosis Date  . Anemia   . Arthritis    "spine, hands" (11/25/2015)  . Back pain    "all my back; probably 3 times/week" (11/25/2015)  . Chronic indwelling Foley catheter 03/04/2017  . Diabetic foot ulcer associated with type 2 diabetes mellitus (HCC)   . Hypertension   . Intracerebral hemorrhage (HCC) As a teenager    States she had burst blood vessel as teenager, now with resultant minor visual field and hearing deficits  . Neuropathy   . Stroke [facility]) ~ 1982   "my feeling on my RLE, right lower eye vision,  & hearing out of my right ear not the same since" (11/25/2015)  . Type II diabetes mellitus [facility])     Patient Active Problem List   Diagnosis Date Noted  . Acute ITP (HCC) 11/16/2018  . Onychomycosis of toenail  10/16/2018  . Cataract of both eyes 10/16/2018  . Tinea pedis of left foot 10/16/2018  . Decreased vision in both eyes 10/16/2018  . CKD (chronic kidney disease) stage 3, GFR 30-59 ml/min   . Cellulitis of left lower extremity 01/25/2018  . Hyperkalemia 01/25/2018  . Poor compliance 03/08/2017  . Hypoxia 03/07/2017  . Thyroid nodule 03/07/2017  . Chronic indwelling Foley catheter 03/04/2017  . Type II diabetes mellitus (HCC)   . Femur fracture (HCC) 03/02/2017  . Orthostatic dizziness   . Urinary tract infection without hematuria   . Enterococcus faecalis infection   . Near syncope 11/25/2015  . Dehydration 11/25/2015  . Orthostatic hypotension 11/25/2015  . UTI (urinary tract infection) 11/25/2015  . Acute worsening of stage 3 chronic kidney disease 09/24/2015  . Diabetes mellitus due to underlying condition with chronic kidney disease, without long-term current use of insulin (HCC)   . Fever   . Status post below knee amputation of right lower extremity (HCC)   . Diabetic peripheral neuropathy (HCC)   . Neuropathic pain   . Benign essential HTN   . Folliculitis   . Slow transit constipation   . Anemia of chronic disease   . Bilateral hydronephrosis   . Urinary retention   . CKD (chronic kidney disease)   .  Uncontrolled type 2 diabetes mellitus with diabetic nephropathy, without long-term current use of insulin (HCC)   . Vasculopathy   . Debility 09/04/2015  . DM type 2 with diabetic peripheral neuropathy (HCC)   . S/P BKA (below knee amputation) unilateral (HCC)   . Medical non-compliance   . Benign skin lesion of multiple sites   . Tachypnea   . Acute blood loss anemia   . Neurogenic bladder   . Occult blood positive stool   . Rectovaginal fistula   . Controlled diabetes mellitus type 2 with complications (HCC)   . Acute on chronic renal failure (HCC)   . Hydronephrosis determined by ultrasound   . Papular rash   . Uncontrolled type 2 diabetes mellitus with  complication (HCC)   . Paresthesia   . Thrombocytopenia (HCC) 08/23/2015  . Pressure ulcer 08/23/2015  . Severe anemia 08/23/2015  . Lower urinary tract infectious disease 08/23/2015  . Absolute anemia 08/23/2015  . Peripheral vascular disease, unspecified (HCC) 04/27/2012  . Unilateral complete BKA (HCC) 07/09/2011  . Gas gangrene (HCC) 07/02/2011  . Sepsis(995.91) 07/02/2011  . Acute renal failure (HCC) 07/02/2011  . Diabetes mellitus (HCC) 11/22/2010  . Diabetic foot ulcer associated with type 2 diabetes mellitus (HCC)   . Essential hypertension   . Arthritis   . Neuropathy (HCC)   . Intracerebral hemorrhage [facility](HCC)     Past Surgical History:  Procedure Laterality Date  . AMPUTATION  11/24/2010   Procedure: AMPUTATION FOOT;  Surgeon: Toni ArthursJohn Hewitt, MD;  [facility];  Service: Orthopedics;  Laterality: Right;  Right  FOOT TRANS METATARSAL AMPUTATION  . AMPUTATION  07/02/2011   Procedure: AMPUTATION BELOW KNEE;  Surgeon: Toni ArthursJohn Hewitt, MD;  Location: [facility] OR;  Service: Orthopedics;  Laterality: Right;  . APPLICATION OF WOUND VAC  07/02/2011   Procedure: APPLICATION OF WOUND VAC;  Surgeon: Toni ArthursJohn Hewitt, MD;  Location: [facility] OR;  Service: Orthopedics;  Laterality: Right;  . BRAIN SURGERY  1980's   [place]  . CESAREAN SECTION  2004; 2006  . COLONOSCOPY N/A 09/17/2015   Procedure: COLONOSCOPY;  Surgeon: Charlott RakesVincent Schooler, MD;  [facility];  Service: Endoscopy;  Laterality: N/A;  . ESOPHAGOGASTRODUODENOSCOPY N/A 09/17/2015   Procedure: ESOPHAGOGASTRODUODENOSCOPY (EGD);  Surgeon: Charlott RakesVincent Schooler, MD;  [facility];  Service: Endoscopy;  Laterality: N/A;  . I & D EXTREMITY  11/24/2010   Procedure: IRRIGATION AND DEBRIDEMENT EXTREMITY;  Surgeon: Toni ArthursJohn Hewitt, MD;  Location: [facility] OR;  Service: Orthopedics;  Laterality: Left;  Debriedment of left plantar ulcer and trimming of toenails  . I & D EXTREMITY  07/02/2011   Procedure: IRRIGATION AND  DEBRIDEMENT EXTREMITY;  Surgeon: Toni ArthursJohn Hewitt, MD;  Location: [facility] OR;  Service: Orthopedics;  Laterality: Left;  I & D of Left Foot  . I & D EXTREMITY  07/06/2011   Procedure: IRRIGATION AND DEBRIDEMENT EXTREMITY;  Surgeon: Toni ArthursJohn Hewitt, MD;  Location: [facility] OR;  Service: Orthopedics;  Laterality: Right;  IRRIGATION/DEBRIDEMENT RIGHT BELOW KNEE AMPUTATION  . TUBAL LIGATION  2006     OB History   No obstetric history on file.     Family History  Problem Relation Age of Onset  . Diabetes Mother   . Hypertension Mother   . Hyperlipidemia Mother   . Diabetes Father   . Cancer Father        prostate  . Hyperlipidemia Father   . Hypertension Father   . Diabetes Brother   . Peripheral Artery Disease Brother  has had toes amputated  . Diabetes Son     Social History   Tobacco Use  . Smoking status: Never Smoker  . Smokeless tobacco: Never Used  Substance Use Topics  . Alcohol use: Yes    Alcohol/week: 2.0 standard drinks    Types: 2 Glasses of wine per week  . Drug use: No    Home Medications Prior to Admission medications   Medication Sig Start Date End Date Taking? Authorizing Provider  amLODipine (NORVASC) 10 MG tablet Take 1 tablet (10 mg total) by mouth daily. 11/14/18  Yes Nita Sells, MD  eltrombopag (PROMACTA) 50 MG tablet Take 1 tablet (50 mg total) by mouth daily. Take on an empty stomach, 1 hour before a meal or 2 hours after. 03/06/19  Yes Nicholas Lose, MD  gabapentin (NEURONTIN) 400 MG capsule Take 1 capsule (400 mg total) by mouth 3 (three) times daily. Take 400 mg by mouth two times a day, and an additional 400 mg as needed for nerve pain Patient taking differently: Take 400 mg by mouth 3 (three) times daily.  10/16/18  Yes Mack Hook, MD  metFORMIN (GLUCOPHAGE) 500 MG tablet Take 1 tablet (500 mg total) by mouth 2 (two) times daily with a meal. 11/13/18 03/20/19 Yes Nita Sells, MD  furosemide (LASIX) 20 MG tablet Take 1 tablet (20 mg  total) by mouth daily. 03/20/19   Deno Etienne, DO    Allergies    Patient has no known allergies.  Review of Systems   Review of Systems  Constitutional: Negative for chills and fever.  HENT: Negative for congestion and rhinorrhea.   Eyes: Negative for redness and visual disturbance.  Respiratory: Negative for shortness of breath and wheezing.   Cardiovascular: Positive for leg swelling. Negative for chest pain and palpitations.  Gastrointestinal: Negative for nausea and vomiting.  Genitourinary: Negative for dysuria and urgency.  Musculoskeletal: Negative for arthralgias and myalgias.  Skin: Negative for pallor and wound.  Neurological: Negative for dizziness and headaches.    Physical Exam Updated Vital Signs BP (!) 155/74   Pulse 92   Temp 98.3 F (36.8 C) (Oral)   Resp 16   LMP  (LMP Unknown)   SpO2 96%   Physical Exam Vitals and nursing note reviewed.  Constitutional:      General: She is not in acute distress.    Appearance: She is well-developed. She is not diaphoretic.  HENT:     Head: Normocephalic and atraumatic.  Eyes:     Pupils: Pupils are equal, round, and reactive to light.  Cardiovascular:     Rate and Rhythm: Normal rate and regular rhythm.     Heart sounds: No murmur. No friction rub. No gallop.   Pulmonary:     Effort: Pulmonary effort is normal.     Breath sounds: No wheezing or rales.  Abdominal:     General: There is no distension.     Palpations: Abdomen is soft.     Tenderness: There is no abdominal tenderness.  Musculoskeletal:        General: No tenderness.     Cervical back: Normal range of motion and neck supple.     Left lower leg: Edema present.     Comments: Right BKA.  3+ edema to bilateral lower extremities up to the thighs.  Skin:    General: Skin is warm and dry.  Neurological:     Mental Status: She is alert and oriented to person, place, and time.  Psychiatric:  Behavior: Behavior normal.     ED Results /  Procedures / Treatments   Labs (all labs ordered are listed, but only abnormal results are displayed) Labs Reviewed  BASIC METABOLIC PANEL - Abnormal; Notable for the following components:      Result Value   Potassium 5.2 (*)    CO2 20 (*)    Glucose, Bld 110 (*)    BUN 49 (*)    Creatinine, Ser 1.81 (*)    GFR calc non Af Amer 32 (*)    GFR calc Af Amer 37 (*)    All other components within normal limits  CBC - Abnormal; Notable for the following components:   RBC 3.55 (*)    Hemoglobin 9.8 (*)    HCT 33.8 (*)    MCHC 29.0 (*)    RDW 18.6 (*)    All other components within normal limits  BRAIN NATRIURETIC PEPTIDE - Abnormal; Notable for the following components:   B Natriuretic Peptide 627.6 (*)    All other components within normal limits  HEPATIC FUNCTION PANEL - Abnormal; Notable for the following components:   Total Protein 8.8 (*)    Albumin 3.4 (*)    All other components within normal limits    EKG None  Radiology DG Chest Port 1 View  Result Date: 03/20/2019 CLINICAL DATA:  Bilateral lower extremity edema for 2 weeks. Shortness of breath. EXAM: PORTABLE CHEST 1 VIEW COMPARISON:  Chest x-rays dated 02/13/2019 and 03/06/2017 and CT angiogram of the chest dated 02/13/2019 FINDINGS: Chronic cardiomegaly. Pulmonary vascularity is normal. Minimal haziness at the lung bases is probably due to a shallow inspiration. No discrete effusions. No acute bone abnormality. Chronic indentation upon the left side of the trachea above the thoracic inlet consistent with the previously described mass of the left lobe of the thyroid gland. IMPRESSION: No acute abnormality. Chronic cardiomegaly. Electronically Signed   By: Francene Boyers M.D.   On: 03/20/2019 12:50    Procedures Procedures (including critical care time)  Medications Ordered in ED Medications  sodium chloride flush (NS) 0.9 % injection 3 mL (has no administration in time range)  furosemide (LASIX) tablet 80 mg (80 mg  Oral Given 03/20/19 1453)    ED Course  I have reviewed the triage vital signs and the nursing notes.  Pertinent labs & imaging results that were available during my care of the patient were reviewed by me and considered in my medical decision making (see chart for details).    MDM Rules/Calculators/A&P                      53 yo F with a chief complaints of lower extremity edema.  Patient does endorse sleeping more in a chair and so this might be the cause of her symptoms.  Will obtain a laboratory evaluation to assess for hepatitis or renal dysfunction or heart failure.  The patient's BNP is mildly elevated.  Is actually better than the last time it was checked.  She otherwise shows no signs of heart failure.  Lungs are clear for me.  Renal functions at baseline.  No significant LFT elevation.  With her history of being more upright will treat with a short course of Lasix.  After follow-up with her family doctor.  May consider a echo with her elevated BNP.  2:53 PM:  I have discussed the diagnosis/risks/treatment options with the patient and believe the pt to be eligible for discharge home to follow-up  with PCP. We also discussed returning to the ED immediately if new or worsening sx occur. We discussed the sx which are most concerning (e.g., sudden worsening pain, fever, inability to tolerate by mouth) that necessitate immediate return. Medications administered to the patient during their visit and any new prescriptions provided to the patient are listed below.  Medications given during this visit Medications  sodium chloride flush (NS) 0.9 % injection 3 mL (has no administration in time range)  furosemide (LASIX) tablet 80 mg (80 mg Oral Given 03/20/19 1453)     The patient appears reasonably screen and/or stabilized for discharge and I doubt any other medical condition or other [facility] requiring further screening, evaluation, or treatment in the ED at this time prior to discharge.    Final  Clinical Impression(s) / ED Diagnoses Final diagnoses:  Peripheral edema    Rx / DC Orders ED Discharge Orders         Ordered    furosemide (LASIX) 20 MG tablet  Daily     03/20/19 1443           Melene Plan, DO 03/20/19 1453

## 2019-04-02 NOTE — Assessment & Plan Note (Addendum)
Acute exacerbation of ITP: 11/07/2018: Dexamethasone and IVIG administered: Resolved Relapsed ITP 12/18/2018 Current treatment:Promacta 50 mg dailystarted 01/01/2019 (did not respond)--unable to take more than 30 days due to high copay of $3000 IVIG treatment 01/24/2019 and 01/25/2019  Her platelet count is 16 today.  She will return on Friday and Monday for IVIG.  She has no easy bruising or bleeding.  We will see her on 4/9.  Considering her inability to receive steroid treatment secondary to her diabetes, and difficulty obtaining Promacta, I placed orders for Nplate for her to receive on Friday 4/9 after labs, f/u and discussion with Dr. Pamelia Hoit.    Her legs are bothering her very much and it is very obvious that she has decreased health literacy.  I reached out to our Child psychotherapist, and my nurse reached out to Dr. Renne Crigler office.  We are looking to either get her in with Dr. Delrae Alfred, or to find her a different PCP.    I discussed this case with Dr. Darnelle Catalan to review the plan, Seward Grater, our pharmacist to review treatment orders and dosing, Carrington Clamp, our Child psychotherapist, nursing leadership regarding her infusion schedule, and Porsche helped to coordinate her treatments.

## 2019-04-02 NOTE — Progress Notes (Signed)
[facility] Cancer Follow up:    Julieanne MansonMulberry, Elizabeth, MD [address] [place] [address]   DIAGNOSIS: ITP  SUMMARY OF HEMATOLOGIC HISTORY: 1.  Acute exacerbation of ITP: 11/07/2018: Dexamethasone and IVIG administered: Resolved  2.  relapsed ITP 12/18/2018  (a) Promacta 50 mg dailystarted 01/01/2019 (did not respond)--unable to take longer than one month due to cost.  Stopped at the end of January.  (b) IVIG treatment 01/24/2019 and 01/25/2019   CURRENT THERAPY: intermittent IVIG  INTERVAL HISTORY: Cheryl HansenMichelle L Sandt 54 y.o. female returns for evaluation of her ITP.  She lives alone.  She is in a wheelchair.  She received a ride today from a cab.  She denies any easy bruising or bleeding.  She was in the hospital last week for swelling and pain in her leg.  She says that she called Dr. Delrae AlfredMulberry about her pain and swelling in her legs, and was told the earliest they could see her would be 04/28/2019.     Patient Active Problem List   Diagnosis Date Noted  . Acute ITP (HCC) 11/16/2018  . Onychomycosis of toenail 10/16/2018  . Cataract of both eyes 10/16/2018  . Tinea pedis of left foot 10/16/2018  . Decreased vision in both eyes 10/16/2018  . CKD (chronic kidney disease) stage 3, GFR 30-59 ml/min   . Cellulitis of left lower extremity 01/25/2018  . Hyperkalemia 01/25/2018  . Poor compliance 03/08/2017  . Hypoxia 03/07/2017  . Thyroid nodule 03/07/2017  . Chronic indwelling Foley catheter 03/04/2017  . Type II diabetes mellitus (HCC)   . Femur fracture (HCC) 03/02/2017  . Orthostatic dizziness   . Urinary tract infection without hematuria   . Enterococcus faecalis infection   . Near syncope 11/25/2015  . Dehydration 11/25/2015  . Orthostatic hypotension 11/25/2015  . UTI (urinary tract infection) 11/25/2015  . Acute worsening of stage 3 chronic kidney disease 09/24/2015  . Diabetes mellitus due to underlying condition with chronic kidney disease, without  long-term current use of insulin (HCC)   . Fever   . Status post below knee amputation of right lower extremity (HCC)   . Diabetic peripheral neuropathy (HCC)   . Neuropathic pain   . Benign essential HTN   . Folliculitis   . Slow transit constipation   . Anemia of chronic disease   . Bilateral hydronephrosis   . Urinary retention   . CKD (chronic kidney disease)   . Uncontrolled type 2 diabetes mellitus with diabetic nephropathy, without long-term current use of insulin (HCC)   . Vasculopathy   . Debility 09/04/2015  . DM type 2 with diabetic peripheral neuropathy (HCC)   . S/P BKA (below knee amputation) unilateral (HCC)   . Medical non-compliance   . Benign skin lesion of multiple sites   . Tachypnea   . Acute blood loss anemia   . Neurogenic bladder   . Occult blood positive stool   . Rectovaginal fistula   . Controlled diabetes mellitus type 2 with complications (HCC)   . Acute on chronic renal failure (HCC)   . Hydronephrosis determined by ultrasound   . Papular rash   . Uncontrolled type 2 diabetes mellitus with complication (HCC)   . Paresthesia   . Thrombocytopenia (HCC) 08/23/2015  . Pressure ulcer 08/23/2015  . Severe anemia 08/23/2015  . Lower urinary tract infectious disease 08/23/2015  . Absolute anemia 08/23/2015  . Peripheral vascular disease, unspecified (HCC) 04/27/2012  . Unilateral complete BKA (HCC) 07/09/2011  .  Gas gangrene (HCC) 07/02/2011  . Sepsis(995.91) 07/02/2011  . Acute renal failure (HCC) 07/02/2011  . Diabetes mellitus (HCC) 11/22/2010  . Diabetic foot ulcer associated with type 2 diabetes mellitus (HCC)   . Essential hypertension   . Arthritis   . Neuropathy (HCC)   . Intracerebral hemorrhage (HCC)     has No Known Allergies.  MEDICAL HISTORY: Past Medical History:  Diagnosis Date  . Anemia   . Arthritis    "spine, hands" (11/25/2015)  . Back pain    "all my back; probably 3 times/week" (11/25/2015)  . Chronic indwelling  Foley catheter 03/04/2017  . Diabetic foot ulcer associated with type 2 diabetes mellitus (HCC)   . Hypertension   . Intracerebral hemorrhage (HCC) As a teenager    States she had burst blood vessel as teenager, now with resultant minor visual field and hearing deficits  . Neuropathy   . Stroke [facility]) ~ 1982   "my feeling on my RLE, right lower eye vision,  & hearing out of my right ear not the same since" (11/25/2015)  . Type II diabetes mellitus (HCC)     SURGICAL HISTORY: Past Surgical History:  Procedure Laterality Date  . AMPUTATION  11/24/2010   Procedure: AMPUTATION FOOT;  Surgeon: [name], MD;  Location: [facility] OR;  Service: Orthopedics;  Laterality: Right;  Right  FOOT TRANS METATARSAL AMPUTATION  . AMPUTATION  07/02/2011   Procedure: AMPUTATION BELOW KNEE;  Surgeon: [name], MD;  Location: [facility] OR;  Service: Orthopedics;  Laterality: Right;  . APPLICATION OF WOUND VAC  07/02/2011   Procedure: APPLICATION OF WOUND VAC;  Surgeon: [name], MD;  Location: [facility] OR;  Service: Orthopedics;  Laterality: Right;  . BRAIN SURGERY  1980's   Previous brain surgery in [place], [place]  . CESAREAN SECTION  2004; 2006  . COLONOSCOPY N/A 09/17/2015   Procedure: COLONOSCOPY;  Surgeon: Charlott Rakes, MD;  Location: [facility] ENDOSCOPY;  Service: Endoscopy;  Laterality: N/A;  . ESOPHAGOGASTRODUODENOSCOPY N/A 09/17/2015   Procedure: ESOPHAGOGASTRODUODENOSCOPY (EGD);  Surgeon: Charlott Rakes, MD;  Location: [facility] ENDOSCOPY;  Service: Endoscopy;  Laterality: N/A;  . I & D EXTREMITY  11/24/2010   Procedure: IRRIGATION AND DEBRIDEMENT EXTREMITY;  Surgeon: [name], MD;  Location: [facility] OR;  Service: Orthopedics;  Laterality: Left;  Debriedment of left plantar ulcer and trimming of toenails  . I & D EXTREMITY  07/02/2011   Procedure: IRRIGATION AND DEBRIDEMENT EXTREMITY;  Surgeon: [name], MD;  Location: [facility] OR;  Service: Orthopedics;  Laterality: Left;  I & D of Left Foot  . I & D EXTREMITY  07/06/2011    Procedure: IRRIGATION AND DEBRIDEMENT EXTREMITY;  Surgeon: [name], MD;  Location: [facility] OR;  Service: Orthopedics;  Laterality: Right;  IRRIGATION/DEBRIDEMENT RIGHT BELOW KNEE AMPUTATION  . TUBAL LIGATION  2006    SOCIAL HISTORY: Social History   Socioeconomic History  . Marital status: Widowed    Spouse name: Not on file  . Number of children: Not on file  . Years of education: Not on file  . Highest education level: Not on file  Occupational History  . Not on file  Tobacco Use  . Smoking status: Never Smoker  . Smokeless tobacco: Never Used  Substance and Sexual Activity  . Alcohol use: Yes    Alcohol/week: 2.0 standard drinks    Types: 2 Glasses of wine per week  . Drug use: No  . Sexual activity: Never    Birth control/protection: Post-menopausal  Other Topics Concern  .  Not on file  Social History Narrative   Widowed in 2012   Lives in a home she owns   No wheelchair ramp, though garage entry is flush to both garage and entry floor.   Unable to use full bathroom upstairs.   Has prosthesis for right LE, but never learned how to use.   Cannot see well due to cataracts.   Did not receive disability in 04/1658 with application.   Possibly due to husband's SS she receives   Social Determinants of Radio broadcast assistant Strain:   . Difficulty of Paying Living Expenses:   Food Insecurity:   . Worried About Charity fundraiser in the Last Year:   . Arboriculturist in the Last Year:   Transportation Needs:   . Film/video editor (Medical):   Marland Kitchen Lack of Transportation (Non-Medical):   Physical Activity:   . Days of Exercise per Week:   . Minutes of Exercise per Session:   Stress:   . Feeling of Stress :   Social Connections:   . Frequency of Communication with Friends and Family:   . Frequency of Social Gatherings with Friends and Family:   . Attends Religious Services:   . Active Member of Clubs or Organizations:   . Attends Archivist Meetings:    Marland Kitchen Marital Status:   Intimate Partner Violence:   . Fear of Current or Ex-Partner:   . Emotionally Abused:   Marland Kitchen Physically Abused:   . Sexually Abused:     FAMILY HISTORY: Family History  Problem Relation Age of Onset  . Diabetes Mother   . Hypertension Mother   . Hyperlipidemia Mother   . Diabetes Father   . Cancer Father        prostate  . Hyperlipidemia Father   . Hypertension Father   . Diabetes Brother   . Peripheral Artery Disease Brother        has had toes amputated  . Diabetes Son     Review of Systems  Constitutional: Negative for appetite change, chills, fatigue, fever and unexpected weight change.  HENT:   Negative for hearing loss, lump/mass and sore throat.   Eyes: Negative for eye problems and icterus.  Respiratory: Negative for chest tightness and shortness of breath.   Cardiovascular: Positive for leg swelling.  Gastrointestinal: Negative for abdominal distention, abdominal pain, constipation and diarrhea.  Endocrine: Negative for hot flashes.  Genitourinary: Negative for difficulty urinating.   Musculoskeletal: Negative for arthralgias.  Skin: Negative for itching and rash.  Neurological: Negative for dizziness, extremity weakness, headaches and numbness.  Hematological: Negative for adenopathy. Does not bruise/bleed easily.  Psychiatric/Behavioral: Negative for depression. The patient is not nervous/anxious.       PHYSICAL EXAMINATION  ECOG PERFORMANCE STATUS: 2 - Symptomatic, <50% confined to bed  Vitals:   04/03/19 1455  BP: (!) 143/71  Pulse: 81  Resp: 18  Temp: 98.7 F (37.1 C)  SpO2: 98%    Physical Exam Constitutional:      General: She is not in acute distress.    Appearance: She is not toxic-appearing.     Comments: Appears chronically ill.  Examined in wheelchair  HENT:     Head: Normocephalic and atraumatic.  Eyes:     General: No scleral icterus. Cardiovascular:     Rate and Rhythm: Normal rate and regular rhythm.   Pulmonary:     Effort: Pulmonary effort is normal.     Breath sounds: Normal  breath sounds.  Abdominal:     General: Abdomen is flat. Bowel sounds are normal.     Palpations: Abdomen is soft.  Musculoskeletal:        General: Swelling (bilateral lower extremity (including right bka stump) with swelling and + TTP, no erythema warmth or wounds noted) present.  Skin:    General: Skin is warm and dry.     Capillary Refill: Capillary refill takes less than 2 seconds.     Findings: No rash.  Neurological:     General: No focal deficit present.     Mental Status: She is alert.  Psychiatric:        Mood and Affect: Mood normal.        Behavior: Behavior normal.     LABORATORY DATA:  CBC    Component Value Date/Time   WBC 4.7 04/03/2019 1419   WBC 4.3 03/20/2019 1129   RBC 3.53 (L) 04/03/2019 1419   HGB 9.6 (L) 04/03/2019 1419   HGB 11.0 (L) 12/15/2018 1405   HGB 10.3 (L) 09/30/2015 1118   HCT 32.7 (L) 04/03/2019 1419   HCT 34.1 12/15/2018 1405   HCT 31.6 (L) 09/30/2015 1118   PLT 16 (L) 04/03/2019 1419   PLT 29 (LL) 12/15/2018 1405   MCV 92.6 04/03/2019 1419   MCV 89 12/15/2018 1405   MCV 84.3 09/30/2015 1118   MCH 27.2 04/03/2019 1419   MCHC 29.4 (L) 04/03/2019 1419   RDW 17.9 (H) 04/03/2019 1419   RDW 13.5 12/15/2018 1405   RDW 15.1 (H) 09/30/2015 1118   LYMPHSABS 0.9 04/03/2019 1419   LYMPHSABS 1.1 12/15/2018 1405   LYMPHSABS 1.5 09/30/2015 1118   MONOABS 0.5 04/03/2019 1419   MONOABS 0.3 09/30/2015 1118   EOSABS 0.1 04/03/2019 1419   EOSABS 0.1 12/15/2018 1405   BASOSABS 0.0 04/03/2019 1419   BASOSABS 0.0 12/15/2018 1405   BASOSABS 0.0 09/30/2015 1118    CMP     Component Value Date/Time   NA 135 04/03/2019 1419   NA 137 12/15/2018 1405   NA 138 09/30/2015 1118   K 5.6 (H) 04/03/2019 1419   K 4.6 09/30/2015 1118   CL 108 04/03/2019 1419   CO2 18 (L) 04/03/2019 1419   CO2 19 (L) 09/30/2015 1118   GLUCOSE 99 04/03/2019 1419   GLUCOSE 200 (H) 09/30/2015  1118   BUN 30 (H) 04/03/2019 1419   BUN 18 12/15/2018 1405   BUN 22.3 09/30/2015 1118   CREATININE 1.69 (H) 04/03/2019 1419   CREATININE 1.3 (H) 09/30/2015 1118   CALCIUM 8.5 (L) 04/03/2019 1419   CALCIUM 9.6 09/30/2015 1118   PROT 8.1 04/03/2019 1419   PROT 7.1 12/15/2018 1405   ALBUMIN 3.5 04/03/2019 1419   ALBUMIN 4.0 12/15/2018 1405   AST 17 04/03/2019 1419   ALT 12 04/03/2019 1419   ALKPHOS 68 04/03/2019 1419   BILITOT 0.4 04/03/2019 1419   GFRNONAA 34 (L) 04/03/2019 1419   GFRAA 40 (L) 04/03/2019 1419          ASSESSMENT and THERAPY PLAN:   Acute ITP (HCC) Acute exacerbation of ITP: 11/07/2018: Dexamethasone and IVIG administered: Resolved Relapsed ITP 12/18/2018 Current treatment:Promacta 50 mg dailystarted 01/01/2019 (did not respond)--unable to take more than 30 days due to high copay of $3000 IVIG treatment 01/24/2019 and 01/25/2019  Her platelet count is 16 today.  She will return on Friday and Monday for IVIG.  She has no easy bruising or bleeding.  We will see her on  4/9.  Considering her inability to receive steroid treatment secondary to her diabetes, and difficulty obtaining Promacta, I placed orders for Nplate for her to receive on Friday 4/9 after labs, f/u and discussion with Dr. Pamelia Hoit.    Her legs are bothering her very much and it is very obvious that she has decreased health literacy.  I reached out to our Child psychotherapist, and my nurse reached out to Dr. Renne Crigler office.  We are looking to either get her in with Dr. Delrae Alfred, or to find her a different PCP.    I discussed this case with Dr. Darnelle Catalan to review the plan, Seward Grater, our pharmacist to review treatment orders and dosing, Carrington Clamp, our Child psychotherapist, nursing leadership regarding her infusion schedule, and Porsche helped to coordinate her treatments.     She was recommended to continue with the appropriate pandemic precautions. She knows to call for any questions that may arise between  now and her next appointment.  We are happy to see her sooner if needed.  Total encounter time: 45 minutes*  Lillard Anes, NP 04/03/19 4:13 PM Medical Oncology and Hematology [facility] [address] [place], [place] [address] Tel. [phone]    Fax. [phone]  *Total Encounter Time as defined by the Centers for Medicare and Medicaid Services includes, in addition to the face-to-face time of a patient visit (documented in the note above) non-face-to-face time: obtaining and reviewing outside history, ordering and reviewing medications, tests or procedures, care coordination (communications with other health care professionals or caregivers) and documentation in the medical record.

## 2019-04-03 ENCOUNTER — Inpatient Hospital Stay: Payer: Self-pay

## 2019-04-03 ENCOUNTER — Encounter: Payer: Self-pay | Admitting: Adult Health

## 2019-04-03 ENCOUNTER — Other Ambulatory Visit: Payer: Self-pay

## 2019-04-03 ENCOUNTER — Inpatient Hospital Stay (HOSPITAL_BASED_OUTPATIENT_CLINIC_OR_DEPARTMENT_OTHER): Payer: Self-pay | Admitting: Adult Health

## 2019-04-03 VITALS — BP 143/71 | HR 81 | Temp 98.7°F | Resp 18 | Ht 66.0 in

## 2019-04-03 DIAGNOSIS — D693 Immune thrombocytopenic purpura: Secondary | ICD-10-CM

## 2019-04-03 LAB — CBC WITH DIFFERENTIAL (CANCER CENTER ONLY)
Abs Immature Granulocytes: 0.01 10*3/uL (ref 0.00–0.07)
Basophils Absolute: 0 10*3/uL (ref 0.0–0.1)
Basophils Relative: 1 %
Eosinophils Absolute: 0.1 10*3/uL (ref 0.0–0.5)
Eosinophils Relative: 2 %
HCT: 32.7 % — ABNORMAL LOW (ref 36.0–46.0)
Hemoglobin: 9.6 g/dL — ABNORMAL LOW (ref 12.0–15.0)
Immature Granulocytes: 0 %
Lymphocytes Relative: 20 %
Lymphs Abs: 0.9 10*3/uL (ref 0.7–4.0)
MCH: 27.2 pg (ref 26.0–34.0)
MCHC: 29.4 g/dL — ABNORMAL LOW (ref 30.0–36.0)
MCV: 92.6 fL (ref 80.0–100.0)
Monocytes Absolute: 0.5 10*3/uL (ref 0.1–1.0)
Monocytes Relative: 10 %
Neutro Abs: 3.2 10*3/uL (ref 1.7–7.7)
Neutrophils Relative %: 67 %
Platelet Count: 16 10*3/uL — ABNORMAL LOW (ref 150–400)
RBC: 3.53 MIL/uL — ABNORMAL LOW (ref 3.87–5.11)
RDW: 17.9 % — ABNORMAL HIGH (ref 11.5–15.5)
WBC Count: 4.7 10*3/uL (ref 4.0–10.5)
nRBC: 0 % (ref 0.0–0.2)

## 2019-04-03 LAB — CMP (CANCER CENTER ONLY)
ALT: 12 U/L (ref 0–44)
AST: 17 U/L (ref 15–41)
Albumin: 3.5 g/dL (ref 3.5–5.0)
Alkaline Phosphatase: 68 U/L (ref 38–126)
Anion gap: 9 (ref 5–15)
BUN: 30 mg/dL — ABNORMAL HIGH (ref 6–20)
CO2: 18 mmol/L — ABNORMAL LOW (ref 22–32)
Calcium: 8.5 mg/dL — ABNORMAL LOW (ref 8.9–10.3)
Chloride: 108 mmol/L (ref 98–111)
Creatinine: 1.69 mg/dL — ABNORMAL HIGH (ref 0.44–1.00)
GFR, Est AFR Am: 40 mL/min — ABNORMAL LOW (ref >60)
GFR, Estimated: 34 mL/min — ABNORMAL LOW (ref >60)
Glucose, Bld: 99 mg/dL (ref 70–99)
Potassium: 5.6 mmol/L — ABNORMAL HIGH (ref 3.5–5.1)
Sodium: 135 mmol/L (ref 135–145)
Total Bilirubin: 0.4 mg/dL (ref 0.3–1.2)
Total Protein: 8.1 g/dL (ref 6.5–8.1)

## 2019-04-03 NOTE — Patient Instructions (Signed)
Romiplostim injection What is this medicine? ROMIPLOSTIM (roe mi PLOE stim) helps your body make more platelets. This medicine is used to treat low platelets caused by chronic idiopathic thrombocytopenic purpura (ITP). This medicine may be used for other purposes; ask your health care provider or pharmacist if you have questions. COMMON BRAND NAME(S): Nplate What should I tell my health care provider before I take this medicine? They need to know if you have any of these conditions:  bleeding disorders  bone marrow problem, like blood cancer or myelodysplastic syndrome  history of blood clots  liver disease  surgery to remove your spleen  an unusual or allergic reaction to romiplostim, mannitol, other medicines, foods, dyes, or preservatives  pregnant or trying to get pregnant  breast-feeding How should I use this medicine? This medicine is for injection under the skin. It is given by a health care professional in a hospital or clinic setting. A special MedGuide will be given to you before your injection. Read this information carefully each time. Talk to your pediatrician regarding the use of this medicine in children. While this drug may be prescribed for children as young as 1 year for selected conditions, precautions do apply. Overdosage: If you think you have taken too much of this medicine contact a poison control center or emergency room at once. NOTE: This medicine is only for you. Do not share this medicine with others. What if I miss a dose? It is important not to miss your dose. Call your doctor or health care professional if you are unable to keep an appointment. What may interact with this medicine? Interactions are not expected. This list may not describe all possible interactions. Give your health care provider a list of all the medicines, herbs, non-prescription drugs, or dietary supplements you use. Also tell them if you smoke, drink alcohol, or use illegal drugs.  Some items may interact with your medicine. What should I watch for while using this medicine? Your condition will be monitored carefully while you are receiving this medicine. Visit your prescriber or health care professional for regular checks on your progress and for the needed blood tests. It is important to keep all appointments. What side effects may I notice from receiving this medicine? Side effects that you should report to your doctor or health care professional as soon as possible:  allergic reactions like skin rash, itching or hives, swelling of the face, lips, or tongue  signs and symptoms of bleeding such as bloody or black, tarry stools; red or dark brown urine; spitting up blood or brown material that looks like coffee grounds; red spots on the skin; unusual bruising or bleeding from the eyes, gums, or nose  signs and symptoms of a blood clot such as chest pain; shortness of breath; pain, swelling, or warmth in the leg  signs and symptoms of a stroke like changes in vision; confusion; trouble speaking or understanding; severe headaches; sudden numbness or weakness of the face, arm or leg; trouble walking; dizziness; loss of balance or coordination Side effects that usually do not require medical attention (report to your doctor or health care professional if they continue or are bothersome):  headache  pain in arms and legs  pain in mouth  stomach pain This list may not describe all possible side effects. Call your doctor for medical advice about side effects. You may report side effects to FDA at 1-800-FDA-1088. Where should I keep my medicine? This drug is given in a hospital or clinic   and will not be stored at home. NOTE: This sheet is a summary. It may not cover all possible information. If you have questions about this medicine, talk to your doctor, pharmacist, or health care provider.  2020 Elsevier/Gold Standard (2016-12-20 11:10:55)  

## 2019-04-04 ENCOUNTER — Ambulatory Visit: Payer: Medicaid Other

## 2019-04-04 ENCOUNTER — Telehealth: Payer: Self-pay | Admitting: Adult Health

## 2019-04-04 NOTE — Telephone Encounter (Signed)
Scheduled appts per 3/30 los. Left voicemail with new appt details.

## 2019-04-05 ENCOUNTER — Other Ambulatory Visit: Payer: Self-pay

## 2019-04-05 ENCOUNTER — Inpatient Hospital Stay: Payer: Medicare Other

## 2019-04-05 ENCOUNTER — Ambulatory Visit: Payer: Medicaid Other

## 2019-04-06 ENCOUNTER — Other Ambulatory Visit: Payer: Self-pay

## 2019-04-06 ENCOUNTER — Inpatient Hospital Stay: Payer: Medicare Other | Attending: Hematology and Oncology

## 2019-04-06 VITALS — BP 146/67 | HR 75 | Temp 98.2°F | Resp 18

## 2019-04-06 DIAGNOSIS — D693 Immune thrombocytopenic purpura: Secondary | ICD-10-CM | POA: Diagnosis not present

## 2019-04-06 MED ORDER — ACETAMINOPHEN 325 MG PO TABS
ORAL_TABLET | ORAL | Status: AC
  Filled 2019-04-06: qty 2

## 2019-04-06 MED ORDER — ACETAMINOPHEN 325 MG PO TABS
650.0000 mg | ORAL_TABLET | Freq: Once | ORAL | Status: AC
  Administered 2019-04-06: 650 mg via ORAL

## 2019-04-06 MED ORDER — DIPHENHYDRAMINE HCL 25 MG PO CAPS
ORAL_CAPSULE | ORAL | Status: AC
  Filled 2019-04-06: qty 1

## 2019-04-06 MED ORDER — IMMUNE GLOBULIN (HUMAN) 20 GM/200ML IV SOLN
1.0000 g/kg | Freq: Once | INTRAVENOUS | Status: AC
  Administered 2019-04-06: 100 g via INTRAVENOUS
  Filled 2019-04-06: qty 1000

## 2019-04-06 MED ORDER — DIPHENHYDRAMINE HCL 25 MG PO CAPS
25.0000 mg | ORAL_CAPSULE | Freq: Once | ORAL | Status: AC
  Administered 2019-04-06: 25 mg via ORAL

## 2019-04-06 MED ORDER — DEXTROSE 5 % IV SOLN
INTRAVENOUS | Status: DC
  Filled 2019-04-06: qty 250

## 2019-04-06 NOTE — Patient Instructions (Signed)
Immune Globulin Injection What is this medicine? IMMUNE GLOBULIN (im MUNE GLOB yoo lin) helps to prevent or reduce the severity of certain infections in patients who are at risk. This medicine is collected from the pooled blood of many donors. It is used to treat immune system problems, thrombocytopenia, and Kawasaki syndrome. This medicine may be used for other purposes; ask your health care provider or pharmacist if you have questions. COMMON BRAND NAME(S): ASCENIV, Baygam, BIVIGAM, Carimune, Carimune NF, cutaquig, Cuvitru, Flebogamma, Flebogamma DIF, GamaSTAN, GamaSTAN S/D, Gamimune N, Gammagard, Gammagard S/D, Gammaked, Gammaplex, Gammar-P IV, Gamunex, Gamunex-C, Hizentra, Iveegam, Iveegam EN, Octagam, Panglobulin, Panglobulin NF, panzyga, Polygam S/D, Privigen, Sandoglobulin, Venoglobulin-S, Vigam, Vivaglobulin, Xembify What should I tell my health care provider before I take this medicine? They need to know if you have any of these conditions:  diabetes  extremely low or no immune antibodies in the blood  heart disease  history of blood clots  hyperprolinemia  infection in the blood, sepsis  kidney disease  recently received or scheduled to receive a vaccination  an unusual or allergic reaction to human immune globulin, albumin, maltose, sucrose, other medicines, foods, dyes, or preservatives  pregnant or trying to get pregnant  breast-feeding How should I use this medicine? This medicine is for injection into a muscle or infusion into a vein or skin. It is usually given by a health care professional in a hospital or clinic setting. In rare cases, some brands of this medicine might be given at home. You will be taught how to give this medicine. Use exactly as directed. Take your medicine at regular intervals. Do not take your medicine more often than directed. Talk to your pediatrician regarding the use of this medicine in children. While this drug may be prescribed for selected  conditions, precautions do apply. Overdosage: If you think you have taken too much of this medicine contact a poison control center or emergency room at once. NOTE: This medicine is only for you. Do not share this medicine with others. What if I miss a dose? It is important not to miss your dose. Call your doctor or health care professional if you are unable to keep an appointment. If you give yourself the medicine and you miss a dose, take it as soon as you can. If it is almost time for your next dose, take only that dose. Do not take double or extra doses. What may interact with this medicine?  aspirin and aspirin-like medicines  cisplatin  cyclosporine  medicines for infection like acyclovir, adefovir, amphotericin B, bacitracin, cidofovir, foscarnet, ganciclovir, gentamicin, pentamidine, vancomycin  NSAIDS, medicines for pain and inflammation, like ibuprofen or naproxen  pamidronate  vaccines  zoledronic acid This list may not describe all possible interactions. Give your health care provider a list of all the medicines, herbs, non-prescription drugs, or dietary supplements you use. Also tell them if you smoke, drink alcohol, or use illegal drugs. Some items may interact with your medicine. What should I watch for while using this medicine? Your condition will be monitored carefully while you are receiving this medicine. This medicine is made from pooled blood donations of many different people. It may be possible to pass an infection in this medicine. However, the donors are screened for infections and all products are tested for HIV and hepatitis. The medicine is treated to kill most or all bacteria and viruses. Talk to your doctor about the risks and benefits of this medicine. Do not have vaccinations for at least   14 days before, or until at least 3 months after receiving this medicine. What side effects may I notice from receiving this medicine? Side effects that you should report  to your doctor or health care professional as soon as possible:  allergic reactions like skin rash, itching or hives, swelling of the face, lips, or tongue  blue colored lips or skin  breathing problems  chest pain or tightness  fever  signs and symptoms of aseptic meningitis such as stiff neck; sensitivity to light; headache; drowsiness; fever; nausea; vomiting; rash  signs and symptoms of a blood clot such as chest pain; shortness of breath; pain, swelling, or warmth in the leg  signs and symptoms of hemolytic anemia such as fast heartbeat; tiredness; dark yellow or brown urine; or yellowing of the eyes or skin  signs and symptoms of kidney injury like trouble passing urine or change in the amount of urine  sudden weight gain  swelling of the ankles, feet, hands Side effects that usually do not require medical attention (report to your doctor or health care professional if they continue or are bothersome):  diarrhea  flushing  headache  increased sweating  joint pain  muscle cramps  muscle pain  nausea  pain, redness, or irritation at site where injected  tiredness This list may not describe all possible side effects. Call your doctor for medical advice about side effects. You may report side effects to FDA at 1-800-FDA-1088. Where should I keep my medicine? Keep out of the reach of children. This drug is usually given in a hospital or clinic and will not be stored at home. In rare cases, some brands of this medicine may be given at home. If you are using this medicine at home, you will be instructed on how to store this medicine. Throw away any unused medicine after the expiration date on the label. NOTE: This sheet is a summary. It may not cover all possible information. If you have questions about this medicine, talk to your doctor, pharmacist, or health care provider.  2020 Elsevier/Gold Standard (2018-07-26 12:51:14)  

## 2019-04-09 ENCOUNTER — Other Ambulatory Visit: Payer: Self-pay

## 2019-04-09 ENCOUNTER — Inpatient Hospital Stay: Payer: Medicare Other

## 2019-04-09 VITALS — BP 140/81 | HR 77 | Temp 98.1°F | Resp 20

## 2019-04-09 DIAGNOSIS — I130 Hypertensive heart and chronic kidney disease with heart failure and stage 1 through stage 4 chronic kidney disease, or unspecified chronic kidney disease: Secondary | ICD-10-CM | POA: Diagnosis not present

## 2019-04-09 DIAGNOSIS — I509 Heart failure, unspecified: Secondary | ICD-10-CM | POA: Diagnosis not present

## 2019-04-09 DIAGNOSIS — D693 Immune thrombocytopenic purpura: Secondary | ICD-10-CM

## 2019-04-09 MED ORDER — ACETAMINOPHEN 325 MG PO TABS
ORAL_TABLET | ORAL | Status: AC
  Filled 2019-04-09: qty 2

## 2019-04-09 MED ORDER — DIPHENHYDRAMINE HCL 25 MG PO CAPS
ORAL_CAPSULE | ORAL | Status: AC
  Filled 2019-04-09: qty 1

## 2019-04-09 MED ORDER — DIPHENHYDRAMINE HCL 25 MG PO CAPS
25.0000 mg | ORAL_CAPSULE | Freq: Once | ORAL | Status: AC
  Administered 2019-04-09: 09:00:00 25 mg via ORAL

## 2019-04-09 MED ORDER — ACETAMINOPHEN 325 MG PO TABS
650.0000 mg | ORAL_TABLET | Freq: Once | ORAL | Status: AC
  Administered 2019-04-09: 650 mg via ORAL

## 2019-04-09 MED ORDER — IMMUNE GLOBULIN (HUMAN) 20 GM/200ML IV SOLN
1.0000 g/kg | Freq: Once | INTRAVENOUS | Status: AC
  Administered 2019-04-09: 100 g via INTRAVENOUS
  Filled 2019-04-09: qty 800

## 2019-04-09 NOTE — Patient Instructions (Signed)
Immune Globulin Injection What is this medicine? IMMUNE GLOBULIN (im MUNE GLOB yoo lin) helps to prevent or reduce the severity of certain infections in patients who are at risk. This medicine is collected from the pooled blood of many donors. It is used to treat immune system problems, thrombocytopenia, and Kawasaki syndrome. This medicine may be used for other purposes; ask your health care provider or pharmacist if you have questions. COMMON BRAND NAME(S): ASCENIV, Baygam, BIVIGAM, Carimune, Carimune NF, cutaquig, Cuvitru, Flebogamma, Flebogamma DIF, GamaSTAN, GamaSTAN S/D, Gamimune N, Gammagard, Gammagard S/D, Gammaked, Gammaplex, Gammar-P IV, Gamunex, Gamunex-C, Hizentra, Iveegam, Iveegam EN, Octagam, Panglobulin, Panglobulin NF, panzyga, Polygam S/D, Privigen, Sandoglobulin, Venoglobulin-S, Vigam, Vivaglobulin, Xembify What should I tell my health care provider before I take this medicine? They need to know if you have any of these conditions:  diabetes  extremely low or no immune antibodies in the blood  heart disease  history of blood clots  hyperprolinemia  infection in the blood, sepsis  kidney disease  recently received or scheduled to receive a vaccination  an unusual or allergic reaction to human immune globulin, albumin, maltose, sucrose, other medicines, foods, dyes, or preservatives  pregnant or trying to get pregnant  breast-feeding How should I use this medicine? This medicine is for injection into a muscle or infusion into a vein or skin. It is usually given by a health care professional in a hospital or clinic setting. In rare cases, some brands of this medicine might be given at home. You will be taught how to give this medicine. Use exactly as directed. Take your medicine at regular intervals. Do not take your medicine more often than directed. Talk to your pediatrician regarding the use of this medicine in children. While this drug may be prescribed for selected  conditions, precautions do apply. Overdosage: If you think you have taken too much of this medicine contact a poison control center or emergency room at once. NOTE: This medicine is only for you. Do not share this medicine with others. What if I miss a dose? It is important not to miss your dose. Call your doctor or health care professional if you are unable to keep an appointment. If you give yourself the medicine and you miss a dose, take it as soon as you can. If it is almost time for your next dose, take only that dose. Do not take double or extra doses. What may interact with this medicine?  aspirin and aspirin-like medicines  cisplatin  cyclosporine  medicines for infection like acyclovir, adefovir, amphotericin B, bacitracin, cidofovir, foscarnet, ganciclovir, gentamicin, pentamidine, vancomycin  NSAIDS, medicines for pain and inflammation, like ibuprofen or naproxen  pamidronate  vaccines  zoledronic acid This list may not describe all possible interactions. Give your health care provider a list of all the medicines, herbs, non-prescription drugs, or dietary supplements you use. Also tell them if you smoke, drink alcohol, or use illegal drugs. Some items may interact with your medicine. What should I watch for while using this medicine? Your condition will be monitored carefully while you are receiving this medicine. This medicine is made from pooled blood donations of many different people. It may be possible to pass an infection in this medicine. However, the donors are screened for infections and all products are tested for HIV and hepatitis. The medicine is treated to kill most or all bacteria and viruses. Talk to your doctor about the risks and benefits of this medicine. Do not have vaccinations for at least   14 days before, or until at least 3 months after receiving this medicine. What side effects may I notice from receiving this medicine? Side effects that you should report  to your doctor or health care professional as soon as possible:  allergic reactions like skin rash, itching or hives, swelling of the face, lips, or tongue  blue colored lips or skin  breathing problems  chest pain or tightness  fever  signs and symptoms of aseptic meningitis such as stiff neck; sensitivity to light; headache; drowsiness; fever; nausea; vomiting; rash  signs and symptoms of a blood clot such as chest pain; shortness of breath; pain, swelling, or warmth in the leg  signs and symptoms of hemolytic anemia such as fast heartbeat; tiredness; dark yellow or brown urine; or yellowing of the eyes or skin  signs and symptoms of kidney injury like trouble passing urine or change in the amount of urine  sudden weight gain  swelling of the ankles, feet, hands Side effects that usually do not require medical attention (report to your doctor or health care professional if they continue or are bothersome):  diarrhea  flushing  headache  increased sweating  joint pain  muscle cramps  muscle pain  nausea  pain, redness, or irritation at site where injected  tiredness This list may not describe all possible side effects. Call your doctor for medical advice about side effects. You may report side effects to FDA at 1-800-FDA-1088. Where should I keep my medicine? Keep out of the reach of children. This drug is usually given in a hospital or clinic and will not be stored at home. In rare cases, some brands of this medicine may be given at home. If you are using this medicine at home, you will be instructed on how to store this medicine. Throw away any unused medicine after the expiration date on the label. NOTE: This sheet is a summary. It may not cover all possible information. If you have questions about this medicine, talk to your doctor, pharmacist, or health care provider.  2020 Elsevier/Gold Standard (2018-07-26 12:51:14)  

## 2019-04-10 ENCOUNTER — Other Ambulatory Visit: Payer: Self-pay

## 2019-04-10 ENCOUNTER — Inpatient Hospital Stay (HOSPITAL_COMMUNITY)
Admission: EM | Admit: 2019-04-10 | Discharge: 2019-04-19 | DRG: 291 | Disposition: A | Discharge status: Home Health | Payer: Medicare Other | Attending: Internal Medicine | Admitting: Internal Medicine

## 2019-04-10 DIAGNOSIS — E11319 Type 2 diabetes mellitus with unspecified diabetic retinopathy without macular edema: Secondary | ICD-10-CM | POA: Diagnosis present

## 2019-04-10 DIAGNOSIS — Z833 Family history of diabetes mellitus: Secondary | ICD-10-CM

## 2019-04-10 DIAGNOSIS — N3000 Acute cystitis without hematuria: Secondary | ICD-10-CM

## 2019-04-10 DIAGNOSIS — Z89519 Acquired absence of unspecified leg below knee: Secondary | ICD-10-CM

## 2019-04-10 DIAGNOSIS — Z8042 Family history of malignant neoplasm of prostate: Secondary | ICD-10-CM

## 2019-04-10 DIAGNOSIS — E1151 Type 2 diabetes mellitus with diabetic peripheral angiopathy without gangrene: Secondary | ICD-10-CM | POA: Diagnosis present

## 2019-04-10 DIAGNOSIS — B9620 Unspecified Escherichia coli [E. coli] as the cause of diseases classified elsewhere: Secondary | ICD-10-CM | POA: Diagnosis present

## 2019-04-10 DIAGNOSIS — I509 Heart failure, unspecified: Secondary | ICD-10-CM

## 2019-04-10 DIAGNOSIS — I5033 Acute on chronic diastolic (congestive) heart failure: Secondary | ICD-10-CM

## 2019-04-10 DIAGNOSIS — Z89511 Acquired absence of right leg below knee: Secondary | ICD-10-CM

## 2019-04-10 DIAGNOSIS — Z993 Dependence on wheelchair: Secondary | ICD-10-CM

## 2019-04-10 DIAGNOSIS — D72819 Decreased white blood cell count, unspecified: Secondary | ICD-10-CM | POA: Diagnosis present

## 2019-04-10 DIAGNOSIS — I5031 Acute diastolic (congestive) heart failure: Secondary | ICD-10-CM

## 2019-04-10 DIAGNOSIS — E1142 Type 2 diabetes mellitus with diabetic polyneuropathy: Secondary | ICD-10-CM | POA: Diagnosis present

## 2019-04-10 DIAGNOSIS — Z7984 Long term (current) use of oral hypoglycemic drugs: Secondary | ICD-10-CM

## 2019-04-10 DIAGNOSIS — R609 Edema, unspecified: Secondary | ICD-10-CM

## 2019-04-10 DIAGNOSIS — Z8249 Family history of ischemic heart disease and other diseases of the circulatory system: Secondary | ICD-10-CM

## 2019-04-10 DIAGNOSIS — N1831 Chronic kidney disease, stage 3a: Secondary | ICD-10-CM | POA: Diagnosis present

## 2019-04-10 DIAGNOSIS — E119 Type 2 diabetes mellitus without complications: Secondary | ICD-10-CM

## 2019-04-10 DIAGNOSIS — E1122 Type 2 diabetes mellitus with diabetic chronic kidney disease: Secondary | ICD-10-CM | POA: Diagnosis present

## 2019-04-10 DIAGNOSIS — Z20822 Contact with and (suspected) exposure to covid-19: Secondary | ICD-10-CM | POA: Diagnosis present

## 2019-04-10 DIAGNOSIS — E875 Hyperkalemia: Secondary | ICD-10-CM | POA: Diagnosis present

## 2019-04-10 DIAGNOSIS — E1136 Type 2 diabetes mellitus with diabetic cataract: Secondary | ICD-10-CM | POA: Diagnosis present

## 2019-04-10 DIAGNOSIS — I130 Hypertensive heart and chronic kidney disease with heart failure and stage 1 through stage 4 chronic kidney disease, or unspecified chronic kidney disease: Principal | ICD-10-CM | POA: Diagnosis present

## 2019-04-10 DIAGNOSIS — Z862 Personal history of diseases of the blood and blood-forming organs and certain disorders involving the immune mechanism: Secondary | ICD-10-CM

## 2019-04-10 DIAGNOSIS — Z6841 Body Mass Index (BMI) 40.0 and over, adult: Secondary | ICD-10-CM

## 2019-04-10 DIAGNOSIS — N390 Urinary tract infection, site not specified: Secondary | ICD-10-CM | POA: Diagnosis present

## 2019-04-10 DIAGNOSIS — N183 Chronic kidney disease, stage 3 unspecified: Secondary | ICD-10-CM | POA: Diagnosis present

## 2019-04-10 DIAGNOSIS — Z83438 Family history of other disorder of lipoprotein metabolism and other lipidemia: Secondary | ICD-10-CM

## 2019-04-10 LAB — BASIC METABOLIC PANEL
Anion gap: 8 (ref 5–15)
BUN: 27 mg/dL — ABNORMAL HIGH (ref 6–20)
CO2: 19 mmol/L — ABNORMAL LOW (ref 22–32)
Calcium: 8.4 mg/dL — ABNORMAL LOW (ref 8.9–10.3)
Chloride: 106 mmol/L (ref 98–111)
Creatinine, Ser: 1.33 mg/dL — ABNORMAL HIGH (ref 0.44–1.00)
GFR calc Af Amer: 53 mL/min — ABNORMAL LOW (ref >60)
GFR calc non Af Amer: 46 mL/min — ABNORMAL LOW (ref >60)
Glucose, Bld: 100 mg/dL — ABNORMAL HIGH (ref 70–99)
Potassium: 6 mmol/L — ABNORMAL HIGH (ref 3.5–5.1)
Sodium: 133 mmol/L — ABNORMAL LOW (ref 135–145)

## 2019-04-10 LAB — CBC
HCT: 32.7 % — ABNORMAL LOW (ref 36.0–46.0)
Hemoglobin: 9.4 g/dL — ABNORMAL LOW (ref 12.0–15.0)
MCH: 26.9 pg (ref 26.0–34.0)
MCHC: 28.7 g/dL — ABNORMAL LOW (ref 30.0–36.0)
MCV: 93.7 fL (ref 80.0–100.0)
Platelets: 189 10*3/uL (ref 150–400)
RBC: 3.49 MIL/uL — ABNORMAL LOW (ref 3.87–5.11)
RDW: 18.6 % — ABNORMAL HIGH (ref 11.5–15.5)
WBC: 3.9 10*3/uL — ABNORMAL LOW (ref 4.0–10.5)
nRBC: 0 % (ref 0.0–0.2)

## 2019-04-10 LAB — I-STAT BETA HCG BLOOD, ED (MC, WL, AP ONLY): I-stat hCG, quantitative: <5 m[IU]/mL (ref <5)

## 2019-04-10 MED ORDER — SODIUM CHLORIDE 0.9% FLUSH
3.0000 mL | Freq: Once | INTRAVENOUS | Status: AC
  Administered 2019-04-11: 3 mL via INTRAVENOUS

## 2019-04-10 NOTE — ED Triage Notes (Addendum)
Pt c/o bilateral leg swelling and pain, numbness in L leg increasing since February. R leg BKA  Concerned about increasing pain and swelling in stomach as well

## 2019-04-11 ENCOUNTER — Emergency Department (HOSPITAL_COMMUNITY): Payer: Medicare Other

## 2019-04-11 ENCOUNTER — Inpatient Hospital Stay (HOSPITAL_COMMUNITY): Payer: Medicare Other

## 2019-04-11 ENCOUNTER — Encounter (HOSPITAL_COMMUNITY): Payer: Self-pay | Admitting: Internal Medicine

## 2019-04-11 DIAGNOSIS — B9620 Unspecified Escherichia coli [E. coli] as the cause of diseases classified elsewhere: Secondary | ICD-10-CM | POA: Diagnosis present

## 2019-04-11 DIAGNOSIS — E875 Hyperkalemia: Secondary | ICD-10-CM

## 2019-04-11 DIAGNOSIS — N390 Urinary tract infection, site not specified: Secondary | ICD-10-CM | POA: Diagnosis present

## 2019-04-11 DIAGNOSIS — D72819 Decreased white blood cell count, unspecified: Secondary | ICD-10-CM

## 2019-04-11 DIAGNOSIS — I5031 Acute diastolic (congestive) heart failure: Secondary | ICD-10-CM

## 2019-04-11 DIAGNOSIS — R52 Pain, unspecified: Secondary | ICD-10-CM

## 2019-04-11 DIAGNOSIS — E11319 Type 2 diabetes mellitus with unspecified diabetic retinopathy without macular edema: Secondary | ICD-10-CM | POA: Diagnosis present

## 2019-04-11 DIAGNOSIS — Z83438 Family history of other disorder of lipoprotein metabolism and other lipidemia: Secondary | ICD-10-CM | POA: Diagnosis not present

## 2019-04-11 DIAGNOSIS — E1151 Type 2 diabetes mellitus with diabetic peripheral angiopathy without gangrene: Secondary | ICD-10-CM | POA: Diagnosis present

## 2019-04-11 DIAGNOSIS — Z20822 Contact with and (suspected) exposure to covid-19: Secondary | ICD-10-CM | POA: Diagnosis present

## 2019-04-11 DIAGNOSIS — I509 Heart failure, unspecified: Secondary | ICD-10-CM | POA: Diagnosis present

## 2019-04-11 DIAGNOSIS — M7989 Other specified soft tissue disorders: Secondary | ICD-10-CM | POA: Diagnosis not present

## 2019-04-11 DIAGNOSIS — Z7984 Long term (current) use of oral hypoglycemic drugs: Secondary | ICD-10-CM | POA: Diagnosis not present

## 2019-04-11 DIAGNOSIS — N1831 Chronic kidney disease, stage 3a: Secondary | ICD-10-CM | POA: Diagnosis present

## 2019-04-11 DIAGNOSIS — E1136 Type 2 diabetes mellitus with diabetic cataract: Secondary | ICD-10-CM | POA: Diagnosis present

## 2019-04-11 DIAGNOSIS — Z8042 Family history of malignant neoplasm of prostate: Secondary | ICD-10-CM | POA: Diagnosis not present

## 2019-04-11 DIAGNOSIS — Z89519 Acquired absence of unspecified leg below knee: Secondary | ICD-10-CM

## 2019-04-11 DIAGNOSIS — Z833 Family history of diabetes mellitus: Secondary | ICD-10-CM | POA: Diagnosis not present

## 2019-04-11 DIAGNOSIS — Z862 Personal history of diseases of the blood and blood-forming organs and certain disorders involving the immune mechanism: Secondary | ICD-10-CM | POA: Diagnosis not present

## 2019-04-11 DIAGNOSIS — E1169 Type 2 diabetes mellitus with other specified complication: Secondary | ICD-10-CM

## 2019-04-11 DIAGNOSIS — E1122 Type 2 diabetes mellitus with diabetic chronic kidney disease: Secondary | ICD-10-CM | POA: Diagnosis present

## 2019-04-11 DIAGNOSIS — Z89511 Acquired absence of right leg below knee: Secondary | ICD-10-CM | POA: Diagnosis not present

## 2019-04-11 DIAGNOSIS — I5033 Acute on chronic diastolic (congestive) heart failure: Secondary | ICD-10-CM | POA: Diagnosis present

## 2019-04-11 DIAGNOSIS — N3000 Acute cystitis without hematuria: Secondary | ICD-10-CM | POA: Diagnosis not present

## 2019-04-11 DIAGNOSIS — Z6841 Body Mass Index (BMI) 40.0 and over, adult: Secondary | ICD-10-CM | POA: Diagnosis not present

## 2019-04-11 DIAGNOSIS — Z993 Dependence on wheelchair: Secondary | ICD-10-CM | POA: Diagnosis not present

## 2019-04-11 DIAGNOSIS — R079 Chest pain, unspecified: Secondary | ICD-10-CM | POA: Diagnosis not present

## 2019-04-11 DIAGNOSIS — I130 Hypertensive heart and chronic kidney disease with heart failure and stage 1 through stage 4 chronic kidney disease, or unspecified chronic kidney disease: Secondary | ICD-10-CM | POA: Diagnosis present

## 2019-04-11 DIAGNOSIS — Z8249 Family history of ischemic heart disease and other diseases of the circulatory system: Secondary | ICD-10-CM | POA: Diagnosis not present

## 2019-04-11 DIAGNOSIS — E1142 Type 2 diabetes mellitus with diabetic polyneuropathy: Secondary | ICD-10-CM | POA: Diagnosis present

## 2019-04-11 HISTORY — DX: Heart failure, unspecified: I50.9

## 2019-04-11 LAB — HEPATIC FUNCTION PANEL
ALT: 8 U/L (ref 0–44)
AST: 52 U/L — ABNORMAL HIGH (ref 15–41)
Albumin: 3.2 g/dL — ABNORMAL LOW (ref 3.5–5.0)
Alkaline Phosphatase: 62 U/L (ref 38–126)
Bilirubin, Direct: 0.2 mg/dL (ref 0.0–0.2)
Indirect Bilirubin: 0.5 mg/dL (ref 0.3–0.9)
Total Bilirubin: 0.7 mg/dL (ref 0.3–1.2)
Total Protein: 9.7 g/dL — ABNORMAL HIGH (ref 6.5–8.1)

## 2019-04-11 LAB — CBG MONITORING, ED
Glucose-Capillary: 82 mg/dL (ref 70–99)
Glucose-Capillary: 75 mg/dL (ref 70–99)
Glucose-Capillary: 113 mg/dL — ABNORMAL HIGH (ref 70–99)

## 2019-04-11 LAB — GLUCOSE, CAPILLARY
Glucose-Capillary: 128 mg/dL — ABNORMAL HIGH (ref 70–99)
Glucose-Capillary: 166 mg/dL — ABNORMAL HIGH (ref 70–99)

## 2019-04-11 LAB — URINALYSIS, ROUTINE W REFLEX MICROSCOPIC
Bilirubin Urine: NEGATIVE
Glucose, UA: NEGATIVE mg/dL
Ketones, ur: NEGATIVE mg/dL
Nitrite: POSITIVE — AB
Protein, ur: 30 mg/dL — AB
Specific Gravity, Urine: 1.005 (ref 1.005–1.030)
pH: 6 (ref 5.0–8.0)

## 2019-04-11 LAB — TSH: TSH: 3.037 u[IU]/mL (ref 0.350–4.500)

## 2019-04-11 LAB — POTASSIUM: Potassium: 5.2 mmol/L — ABNORMAL HIGH (ref 3.5–5.1)

## 2019-04-11 LAB — ECHOCARDIOGRAM COMPLETE
Height: 66 in
Weight: 3650.82 oz

## 2019-04-11 LAB — SARS CORONAVIRUS 2 (TAT 6-24 HRS): SARS Coronavirus 2: NEGATIVE

## 2019-04-11 LAB — BRAIN NATRIURETIC PEPTIDE: B Natriuretic Peptide: 912.4 pg/mL — ABNORMAL HIGH (ref 0.0–100.0)

## 2019-04-11 MED ORDER — SODIUM CHLORIDE 0.9 % IV SOLN
250.0000 mL | INTRAVENOUS | Status: DC | PRN
  Administered 2019-04-12 – 2019-04-13 (×2): 250 mL via INTRAVENOUS

## 2019-04-11 MED ORDER — SODIUM CHLORIDE 0.9 % IV SOLN
1.0000 g | INTRAVENOUS | Status: DC
  Administered 2019-04-12 – 2019-04-13 (×2): 1 g via INTRAVENOUS
  Filled 2019-04-11 (×2): qty 10

## 2019-04-11 MED ORDER — GABAPENTIN 400 MG PO CAPS: 400.0000 mg | ORAL_CAPSULE | Freq: Every day | ORAL | Status: DC | PRN

## 2019-04-11 MED ORDER — SODIUM CHLORIDE 0.9% FLUSH
3.0000 mL | Freq: Two times a day (BID) | INTRAVENOUS | Status: DC
  Administered 2019-04-11 – 2019-04-19 (×16): 3 mL via INTRAVENOUS

## 2019-04-11 MED ORDER — HYDROCODONE-ACETAMINOPHEN 5-325 MG PO TABS
1.0000 | ORAL_TABLET | Freq: Four times a day (QID) | ORAL | Status: DC | PRN
  Administered 2019-04-11 – 2019-04-18 (×9): 1 via ORAL
  Filled 2019-04-11 (×9): qty 1

## 2019-04-11 MED ORDER — GABAPENTIN 400 MG PO CAPS
400.0000 mg | ORAL_CAPSULE | Freq: Two times a day (BID) | ORAL | Status: DC
  Administered 2019-04-11 – 2019-04-19 (×17): 400 mg via ORAL
  Filled 2019-04-11 (×17): qty 1

## 2019-04-11 MED ORDER — ONDANSETRON HCL 4 MG/2ML IJ SOLN: 4.0000 mg | Freq: Four times a day (QID) | INTRAMUSCULAR | Status: DC | PRN

## 2019-04-11 MED ORDER — ASPIRIN EC 81 MG PO TBEC
81.0000 mg | DELAYED_RELEASE_TABLET | Freq: Every day | ORAL | Status: DC | PRN
  Administered 2019-04-13: 81 mg via ORAL
  Filled 2019-04-11: qty 1

## 2019-04-11 MED ORDER — FUROSEMIDE 10 MG/ML IJ SOLN
40.0000 mg | Freq: Once | INTRAMUSCULAR | Status: AC
  Administered 2019-04-11: 40 mg via INTRAVENOUS
  Filled 2019-04-11: qty 4

## 2019-04-11 MED ORDER — ACETAMINOPHEN 325 MG PO TABS
650.0000 mg | ORAL_TABLET | ORAL | Status: DC | PRN
  Administered 2019-04-11 – 2019-04-18 (×2): 650 mg via ORAL
  Filled 2019-04-11 (×2): qty 2

## 2019-04-11 MED ORDER — SODIUM CHLORIDE 0.9% FLUSH: 3.0000 mL | INTRAVENOUS | Status: DC | PRN

## 2019-04-11 MED ORDER — FUROSEMIDE 10 MG/ML IJ SOLN
40.0000 mg | Freq: Two times a day (BID) | INTRAMUSCULAR | Status: DC
  Administered 2019-04-11 – 2019-04-14 (×8): 40 mg via INTRAVENOUS
  Filled 2019-04-11 (×8): qty 4

## 2019-04-11 MED ORDER — ENOXAPARIN SODIUM 40 MG/0.4ML ~~LOC~~ SOLN
40.0000 mg | SUBCUTANEOUS | Status: DC
  Administered 2019-04-11 – 2019-04-18 (×8): 40 mg via SUBCUTANEOUS
  Filled 2019-04-11 (×8): qty 0.4

## 2019-04-11 MED ORDER — INSULIN ASPART 100 UNIT/ML ~~LOC~~ SOLN
0.0000 [IU] | Freq: Three times a day (TID) | SUBCUTANEOUS | Status: DC
  Administered 2019-04-11 – 2019-04-19 (×11): 1 [IU] via SUBCUTANEOUS
  Administered 2019-04-19: 2 [IU] via SUBCUTANEOUS

## 2019-04-11 MED ORDER — SODIUM CHLORIDE 0.9 % IV SOLN
1.0000 g | Freq: Once | INTRAVENOUS | Status: AC
  Administered 2019-04-11: 1 g via INTRAVENOUS
  Filled 2019-04-11: qty 10

## 2019-04-11 NOTE — Progress Notes (Signed)
Lower extremity venous has been completed.   Preliminary results in CV Proc.   Blanch Media 04/11/2019 10:55 AM

## 2019-04-11 NOTE — Progress Notes (Signed)
  Echocardiogram 2D Echocardiogram has been performed.  Gerda Diss 04/11/2019, 3:20 PM

## 2019-04-11 NOTE — Progress Notes (Signed)
ReDS Clip Diuretic Study Pt study # [id number]  Your patient is in the Blinded arm of the ReDS Clip Diuretic study.  Your patient has had a ReDS reading and the reading has been transmitted to the cloud.   Thank You   The research team   Danae Orleans, PharmD, [facility] PGY2 Cardiology Pharmacy Resident Phone [phone] 04/11/2019       3:57 PM  Please check [email or web address] for unit-specific pharmacist phone numbers

## 2019-04-11 NOTE — Plan of Care (Signed)

## 2019-04-11 NOTE — H&P (Signed)
History and Physical    [name] ZOX:[id number] DOB: 04/07/1965 DOA: 04/10/2019  Referring MD/NP/PA: Shela Leff, MD PCP: [facility] Patient coming from: Home  Chief Complaint: Left leg pain  I have personally briefly reviewed patient's old medical records in [place]   HPI: [name] is a 54 y.o. female with medical history significant of hypertension, DM type II, anemia, ITP, s/p right BKA presents with complaints of left leg pain and swelling over the last 3 weeks.  Swelling actually goes up into her abdomen.  She reports having a sharp pain that comes and goes in her left leg.  Unable to ambulate because of the discomfort.  Associated symptoms include dyspnea on exertion and generalized malaise.  She reports having decreased vision due to cataracts.  Evaluated on 3/16 in the emergency department for the same.  It appears BNP was elevated at 627.6 at that time and she was given 80 mg of Lasix along with a short prescription for Lasix 20 mg daily.  After the 80 mg of Lasix she reported being a lot, but after getting home did not have the same effect with the 20 mg Lasix dose.  She tried to follow-up with her primary care provider but had not been able to get an appointment yet.  Due to worsening symptoms she came to the hospital for further evaluation.  She also notes that her vision is decreased due to cataracts in both eyes that she is in the process of getting removed.  Since her hospitalization in November for ITP she reports that she has had no bleeding.  She has been receiving infusions of immune globulin with Dr. Lindi Adie of oncology at [facility].  ED Course: Upon admission into the emergency department patient was found to be afebrile, respirations 15-26, blood pressures 136/74-170 9/79, and O2 saturations maintained on room air.  Labs significant for WBC 3.9, hemoglobin 9.4, sodium 133, potassium 6, BUN 27, creatinine 1.33, and BNP 912.4.  Chest x-ray  noting cardiomegaly with vascular congestion/edema.  UA was positive for signs of infection.  Patient was given Rocephin and 40 mg of Lasix.  TRH called to admit  Review of Systems  Constitutional: Positive for malaise/fatigue. Negative for fever.  HENT: Negative for ear discharge and sinus pain.   Eyes: Positive for blurred vision. Negative for pain.  Respiratory: Positive for shortness of breath. Negative for wheezing.   Cardiovascular: Positive for leg swelling. Negative for chest pain and claudication.  Gastrointestinal: Positive for abdominal pain. Negative for blood in stool, nausea and vomiting.  Genitourinary: Positive for dysuria. Negative for hematuria.  Musculoskeletal: Positive for myalgias. Negative for falls.  Neurological: Positive for sensory change (Chronic). Negative for focal weakness and loss of consciousness.  Endo/Heme/Allergies: Negative for polydipsia. Bruises/bleeds easily.  Psychiatric/Behavioral: Negative for memory loss and substance abuse.    Past Medical History:  Diagnosis Date  . Anemia   . Arthritis    "spine, hands" (11/25/2015)  . Back pain    "all my back; probably 3 times/week" (11/25/2015)  . Chronic indwelling Foley catheter 03/04/2017  . Diabetic foot ulcer associated with type 2 diabetes mellitus ([place])   . Hypertension   . Intracerebral hemorrhage ([place]) As a teenager    States she had burst blood vessel as teenager, now with resultant minor visual field and hearing deficits  . Neuropathy   . Stroke [facility]) ~ 1982   "my feeling on my RLE, right lower eye vision,  &  hearing out of my right ear not the same since" (11/25/2015)  . Type II diabetes mellitus ([facility])     Past Surgical History:  Procedure Laterality Date  . AMPUTATION  11/24/2010   Procedure: AMPUTATION FOOT;  Surgeon: [name], MD;  Location: [facility] OR;  Service: Orthopedics;  Laterality: Right;  Right  FOOT TRANS METATARSAL AMPUTATION  . AMPUTATION  07/02/2011   Procedure: AMPUTATION  BELOW KNEE;  Surgeon: [name], MD;  Location: [facility] OR;  Service: Orthopedics;  Laterality: Right;  . APPLICATION OF WOUND VAC  07/02/2011   Procedure: APPLICATION OF WOUND VAC;  Surgeon: [name], MD;  Location: [facility] OR;  Service: Orthopedics;  Laterality: Right;  . BRAIN SURGERY  1980's   Previous brain surgery in [place], [place]  . CESAREAN SECTION  2004; 2006  . COLONOSCOPY N/A 09/17/2015   Procedure: COLONOSCOPY;  Surgeon: Charlott Rakes, MD;  Location: [facility] ENDOSCOPY;  Service: Endoscopy;  Laterality: N/A;  . ESOPHAGOGASTRODUODENOSCOPY N/A 09/17/2015   Procedure: ESOPHAGOGASTRODUODENOSCOPY (EGD);  Surgeon: Charlott Rakes, MD;  Location: [facility] ENDOSCOPY;  Service: Endoscopy;  Laterality: N/A;  . I & D EXTREMITY  11/24/2010   Procedure: IRRIGATION AND DEBRIDEMENT EXTREMITY;  Surgeon: [name], MD;  Location: [facility] OR;  Service: Orthopedics;  Laterality: Left;  Debriedment of left plantar ulcer and trimming of toenails  . I & D EXTREMITY  07/02/2011   Procedure: IRRIGATION AND DEBRIDEMENT EXTREMITY;  Surgeon: [name], MD;  Location: [facility] OR;  Service: Orthopedics;  Laterality: Left;  I & D of Left Foot  . I & D EXTREMITY  07/06/2011   Procedure: IRRIGATION AND DEBRIDEMENT EXTREMITY;  Surgeon: [name], MD;  Location: [facility] OR;  Service: Orthopedics;  Laterality: Right;  IRRIGATION/DEBRIDEMENT RIGHT BELOW KNEE AMPUTATION  . TUBAL LIGATION  2006     reports that she has never smoked. She has never used smokeless tobacco. She reports current alcohol use of about 2.0 standard drinks of alcohol per week. She reports that she does not use drugs.  No Known Allergies  Family History  Problem Relation Age of Onset  . Diabetes Mother   . Hypertension Mother   . Hyperlipidemia Mother   . Diabetes Father   . Cancer Father        prostate  . Hyperlipidemia Father   . Hypertension Father   . Diabetes Brother   . Peripheral Artery Disease Brother        has had toes amputated  . Diabetes Son      Prior to Admission medications   Medication Sig Start Date End Date Taking? Authorizing Provider  acetaminophen (TYLENOL) 500 MG tablet Take 1,000 mg by mouth every 6 (six) hours as needed for mild pain.   Yes [provider]  amLODipine (NORVASC) 10 MG tablet Take 1 tablet (10 mg total) by mouth daily. 11/14/18  Yes Rhetta Mura, MD  aspirin EC 81 MG tablet Take 81 mg by mouth daily as needed (chest pain).   Yes [provider]  furosemide (LASIX) 20 MG tablet Take 1 tablet (20 mg total) by mouth daily. 03/20/19  Yes Melene Plan, DO  gabapentin (NEURONTIN) 400 MG capsule Take 1 capsule (400 mg total) by mouth 3 (three) times daily. Take 400 mg by mouth two times a day, and an additional 400 mg as needed for nerve pain Patient taking differently: Take 400 mg by mouth 3 (three) times daily.  10/16/18  Yes Julieanne Manson, MD  metFORMIN (GLUCOPHAGE) 500 MG tablet Take 1  tablet (500 mg total) by mouth 2 (two) times daily with a meal. Patient taking differently: Take 500 mg by mouth daily with breakfast.  11/13/18 04/11/19 Yes Rhetta Mura, MD    Physical Exam:  Constitutional: Obese middle-aged female currently in NAD, calm, comfortable Vitals:   04/11/19 0352 04/11/19 0400 04/11/19 0656 04/11/19 0700  BP: (!) 164/68 (!) 165/85 (!) 157/67 (!) 154/68  Pulse: 95 95 96 98  Resp: 16 16 15  (!) 26  Temp:      TempSrc:      SpO2: 96% 96% 97% 94%  Weight:      Height:       Eyes: Cataracts present ENMT: Mucous membranes are moist. Posterior pharynx clear of any exudate or lesions.  Neck: normal, supple, no masses, no thyromegaly.   JVD appreciated. Respiratory: Normal respiratory effort with positive crackles heard in the mid to lower lung fields.  Patient able to talk in complete sentences.  Currently on room air. Cardiovascular: Regular rate and rhythm, no murmurs / rubs / gallops.  At least 3+ edema of the left lower extremity. Abdomen: no tenderness, no  masses palpated. No hepatosplenomegaly. Bowel sounds positive.  Musculoskeletal: no clubbing / cyanosis.  Right below-knee amputation.   Skin: Callus present of the first digit of the left foot Neurologic: CN 2-12 grossly intact. Sensation intact, DTR normal. Strength 5/5 in all 4.  Psychiatric: Normal judgment and insight. Alert and oriented x 3. Normal mood.     Labs on Admission: I have personally reviewed following labs and imaging studies  CBC: Recent Labs  Lab 04/10/19 1610  WBC 3.9*  HGB 9.4*  HCT 32.7*  MCV 93.7  PLT 189   Basic Metabolic Panel: Recent Labs  Lab 04/10/19 1610  NA 133*  K 6.0*  CL 106  CO2 19*  GLUCOSE 100*  BUN 27*  CREATININE 1.33*  CALCIUM 8.4*   GFR: Estimated Creatinine Clearance: 60.1 mL/min (A) (by C-G formula based on SCr of 1.33 mg/dL (H)). Liver Function Tests: Recent Labs  Lab 04/11/19 0351  AST 52*  ALT 8  ALKPHOS 62  BILITOT 0.7  PROT 9.7*  ALBUMIN 3.2*   No results for input(s): LIPASE, AMYLASE in the last 168 hours. No results for input(s): AMMONIA in the last 168 hours. Coagulation Profile: No results for input(s): INR, PROTIME in the last 168 hours. Cardiac Enzymes: No results for input(s): CKTOTAL, CKMB, CKMBINDEX, TROPONINI in the last 168 hours. BNP (last 3 results) No results for input(s): PROBNP in the last 8760 hours. HbA1C: No results for input(s): HGBA1C in the last 72 hours. CBG: Recent Labs  Lab 04/11/19 0250  GLUCAP 82   Lipid Profile: No results for input(s): CHOL, HDL, LDLCALC, TRIG, CHOLHDL, LDLDIRECT in the last 72 hours. Thyroid Function Tests: No results for input(s): TSH, T4TOTAL, FREET4, T3FREE, THYROIDAB in the last 72 hours. Anemia Panel: No results for input(s): VITAMINB12, FOLATE, FERRITIN, TIBC, IRON, RETICCTPCT in the last 72 hours. Urine analysis:    Component Value Date/Time   COLORURINE YELLOW 04/11/2019 0519   APPEARANCEUR HAZY (A) 04/11/2019 0519   LABSPEC 1.005 04/11/2019  0519   PHURINE 6.0 04/11/2019 0519   GLUCOSEU NEGATIVE 04/11/2019 0519   HGBUR SMALL (A) 04/11/2019 0519   BILIRUBINUR NEGATIVE 04/11/2019 0519   KETONESUR NEGATIVE 04/11/2019 0519   PROTEINUR 30 (A) 04/11/2019 0519   UROBILINOGEN 2.0 (H) 07/08/2011 0051   NITRITE POSITIVE (A) 04/11/2019 0519   LEUKOCYTESUR MODERATE (A) 04/11/2019 0519   Sepsis  Labs: No results found for this or any previous visit (from the past 240 hour(s)).   Radiological Exams on Admission: DG Chest Portable 1 View  Result Date: 04/11/2019 CLINICAL DATA:  Bilateral leg swelling and pain. EXAM: PORTABLE CHEST 1 VIEW COMPARISON:  03/20/2019 FINDINGS: Cardiomegaly and symmetric basal predominant interstitial coarsening. No visible effusion or consolidation. No pneumothorax. IMPRESSION: Cardiomegaly with vascular congestion/borderline edema. Electronically Signed   By: Marnee Spring M.D.   On: 04/11/2019 04:16    EKG: Independently reviewed.  Normal sinus rhythm at 89 bpm with some signs of T wave peaking.  Assessment/Plan Congestive heart failure exacerbation: She presents with continued leg swelling  after previously being seen in the ED on 3/16 given a trial of Lasix at that time.  BNP elevated at 912.4.  Chest x-ray showing cardiomegaly with vascular congestion/edema.  Patient has been given 40 mg of Lasix IV. -Admit to a telemetry bed -Heart failure orders set  initiated  -Continuous pulse oximetry with nasal cannula oxygen as needed to keep O2 saturations >92% -Strict I&Os and daily weights -Elevate lower extremities -Add on TSH -Check Doppler ultrasound of the left lower extremity -Lasix 40 mg IV Bid -Reassess in a.m. and adjust diuresis as needed. -Check echocardiogram -No ACE inhibitor due to renal insufficiency and no beta-blocker due to acute decompensation -Consider starting beta-blocker when medically appropriate -Message sent for cardiology to eval in a.m.  Hyperkalemia: Acute.  Potassium noted to  be 6.  EKG significant from some early signs of T wave peaking.  Patient was given Lasix.  -Recheck potassium  Urinary tract infection: Acute.  Patient reports having some discomfort with urinating.  UA positive for signs of infection. -Follow-up urine culture -Continue Rocephin  Leukopenia: Acute.  WBC 3.9 on admission.  Suspect this could be related with a urinary tract infection. -We will repeat CBC in a.m.  Chronic kidney disease stage IIIa: Patient creatinine on admission noted to be 1.33.  Appears along patient's baseline which ranges from 1.3-1.6. -Daily BMPs while diuresing  Diabetes mellitus type 2: Home medications include Metformin 500 mg daily.  On admission blood glucose 100. -Hypoglycemia protocol -Hold Metformin -CBGs before every meal with sensitive SSI  Essential hypertension: Home blood pressure medications include amlodipine 10 mg daily. -Held amlodipine  History of ITP: Platelet counts currently within normal limits.  Patient being followed by Dr. Pamelia Hoit of oncology and receiving immune globulins. -Continue outpatient follow-up  Cataracts: Patient reports that she is in the process of being set up in outpatient setting for removal of the cataracts. -Continue outpatient follow-up  Status post BKA: Patient reports difficulty ambulating due to swelling. -PT to eval  DVT prophylaxis: Lovenox Code Status: Full Family Communication: No family requested to be updated at this time Disposition Plan: Possible discharge home in 2 to 3 days once medically stable Consults called: None Admission status: Inpatient   Clydie Braun MD [facility] Pager [phone]   If 7PM-7AM, please contact night-coverage [email or web address] Password [facility]  04/11/2019, 7:15 AM

## 2019-04-11 NOTE — ED Notes (Signed)
Lunch Tray Ordered @ 1053.  

## 2019-04-11 NOTE — ED Provider Notes (Signed)
[name] EMERGENCY DEPARTMENT Provider Note   CSN: [id number] Arrival date & time: 04/10/19  1551     History Chief Complaint  Patient presents with  . Leg Pain    [name] is a 54 y.o. female.  Patient to Ed with progressively worsening extremity edema, DOE and generalized weakness over the last 3 weeks. She was seen here on 3/21 for same and was given 20 mg daily Lasix for a total treatment of 4-5 days, which she finished. She reports she did not see any improvement with this regimen and she has continued to worsen until yesterday when she was unable to get herself up out of her wheelchair due to fatigue and weakness. She has pain in her legs but denies chest pain, nausea, vomiting, cough or fever. No wound or injuries. Her primary care provider is [employer] and she reports being unable to get an appointment.   The history is provided by the patient. No language interpreter was used.  Leg Pain Associated symptoms: fatigue ("I'm worn out")   Associated symptoms: no fever        Past Medical History:  Diagnosis Date  . Anemia   . Arthritis    "spine, hands" (11/25/2015)  . Back pain    "all my back; probably 3 times/week" (11/25/2015)  . Chronic indwelling Foley catheter 03/04/2017  . Diabetic foot ulcer associated with type 2 diabetes mellitus (HCC)   . Hypertension   . Intracerebral hemorrhage (HCC) As a teenager    States she had burst blood vessel as teenager, now with resultant minor visual field and hearing deficits  . Neuropathy   . Stroke [facility]) ~ 1982   "my feeling on my RLE, right lower eye vision,  & hearing out of my right ear not the same since" (11/25/2015)  . Type II diabetes mellitus [facility])     Patient Active Problem List   Diagnosis Date Noted  . Acute ITP (HCC) 11/16/2018  . Onychomycosis of toenail 10/16/2018  . Cataract of both eyes 10/16/2018  . Tinea pedis of left foot 10/16/2018  . Decreased vision in both  eyes 10/16/2018  . CKD (chronic kidney disease) stage 3, GFR 30-59 ml/min   . Cellulitis of left lower extremity 01/25/2018  . Hyperkalemia 01/25/2018  . Poor compliance 03/08/2017  . Hypoxia 03/07/2017  . Thyroid nodule 03/07/2017  . Chronic indwelling Foley catheter 03/04/2017  . Type II diabetes mellitus (HCC)   . Femur fracture (HCC) 03/02/2017  . Orthostatic dizziness   . Urinary tract infection without hematuria   . Enterococcus faecalis infection   . Near syncope 11/25/2015  . Dehydration 11/25/2015  . Orthostatic hypotension 11/25/2015  . UTI (urinary tract infection) 11/25/2015  . Acute worsening of stage 3 chronic kidney disease 09/24/2015  . Diabetes mellitus due to underlying condition with chronic kidney disease, without long-term current use of insulin (HCC)   . Fever   . Status post below knee amputation of right lower extremity (HCC)   . Diabetic peripheral neuropathy (HCC)   . Neuropathic pain   . Benign essential HTN   . Folliculitis   . Slow transit constipation   . Anemia of chronic disease   . Bilateral hydronephrosis   . Urinary retention   . CKD (chronic kidney disease)   . Uncontrolled type 2 diabetes mellitus with diabetic nephropathy, without long-term current use of insulin (HCC)   . Vasculopathy   . Debility 09/04/2015  . DM type  2 with diabetic peripheral neuropathy (HCC)   . S/P BKA (below knee amputation) unilateral (HCC)   . Medical non-compliance   . Benign skin lesion of multiple sites   . Tachypnea   . Acute blood loss anemia   . Neurogenic bladder   . Occult blood positive stool   . Rectovaginal fistula   . Controlled diabetes mellitus type 2 with complications (HCC)   . Acute on chronic renal failure (HCC)   . Hydronephrosis determined by ultrasound   . Papular rash   . Uncontrolled type 2 diabetes mellitus with complication (HCC)   . Paresthesia   . Thrombocytopenia (HCC) 08/23/2015  . Pressure ulcer 08/23/2015  . Severe anemia  08/23/2015  . Lower urinary tract infectious disease 08/23/2015  . Absolute anemia 08/23/2015  . Peripheral vascular disease, unspecified (HCC) 04/27/2012  . Unilateral complete BKA (HCC) 07/09/2011  . Gas gangrene (HCC) 07/02/2011  . Sepsis(995.91) 07/02/2011  . Acute renal failure (HCC) 07/02/2011  . Diabetes mellitus (HCC) 11/22/2010  . Diabetic foot ulcer associated with type 2 diabetes mellitus (HCC)   . Essential hypertension   . Arthritis   . Neuropathy (HCC)   . Intracerebral hemorrhage [facility])     Past Surgical History:  Procedure Laterality Date  . AMPUTATION  11/24/2010   Procedure: AMPUTATION FOOT;  Surgeon: [name], MD;  Location: [facility] OR;  Service: Orthopedics;  Laterality: Right;  Right  FOOT TRANS METATARSAL AMPUTATION  . AMPUTATION  07/02/2011   Procedure: AMPUTATION BELOW KNEE;  Surgeon: [name], MD;  Location: [facility] OR;  Service: Orthopedics;  Laterality: Right;  . APPLICATION OF WOUND VAC  07/02/2011   Procedure: APPLICATION OF WOUND VAC;  Surgeon: [name], MD;  Location: [facility] OR;  Service: Orthopedics;  Laterality: Right;  . BRAIN SURGERY  1980's   Previous brain surgery in [place], [place]  . CESAREAN SECTION  2004; 2006  . COLONOSCOPY N/A 09/17/2015   Procedure: COLONOSCOPY;  Surgeon: Charlott Rakes, MD;  Location[facility] ENDOSCOPY;  Service: Endoscopy;  Laterality: N/A;  . ESOPHAGOGASTRODUODENOSCOPY N/A 09/17/2015   Procedure: ESOPHAGOGASTRODUODENOSCOPY (EGD);  Surgeon: Charlott Rakes, MD;  Location: [facility] ENDOSCOPY;  Service: Endoscopy;  Laterality: N/A;  . I & D EXTREMITY  11/24/2010   Procedure: IRRIGATION AND DEBRIDEMENT EXTREMITY;  Surgeon: [name], MD;  Location: [facility] OR;  Service: Orthopedics;  Laterality: Left;  Debriedment of left plantar ulcer and trimming of toenails  . I & D EXTREMITY  07/02/2011   Procedure: IRRIGATION AND DEBRIDEMENT EXTREMITY;  Surgeon: [name], MD;  Location: [facility] OR;  Service: Orthopedics;  Laterality: Left;  I & D of Left Foot    . I & D EXTREMITY  07/06/2011   Procedure: IRRIGATION AND DEBRIDEMENT EXTREMITY;  Surgeon: [name], MD;  Location: [facility] OR;  Service: Orthopedics;  Laterality: Right;  IRRIGATION/DEBRIDEMENT RIGHT BELOW KNEE AMPUTATION  . TUBAL LIGATION  2006     OB History   No obstetric history on file.     Family History  Problem Relation Age of Onset  . Diabetes Mother   . Hypertension Mother   . Hyperlipidemia Mother   . Diabetes Father   . Cancer Father        prostate  . Hyperlipidemia Father   . Hypertension Father   . Diabetes Brother   . Peripheral Artery Disease Brother        has had toes amputated  . Diabetes Son     Social History   Tobacco Use  .  Smoking status: Never Smoker  . Smokeless tobacco: Never Used  Substance Use Topics  . Alcohol use: Yes    Alcohol/week: 2.0 standard drinks    Types: 2 Glasses of wine per week  . Drug use: No    Home Medications Prior to Admission medications   Medication Sig Start Date End Date Taking? Authorizing Provider  amLODipine (NORVASC) 10 MG tablet Take 1 tablet (10 mg total) by mouth daily. 11/14/18   Rhetta MuraSamtani, Jai-Gurmukh, MD  furosemide (LASIX) 20 MG tablet Take 1 tablet (20 mg total) by mouth daily. 03/20/19   Melene PlanFloyd, Dan, DO  gabapentin (NEURONTIN) 400 MG capsule Take 1 capsule (400 mg total) by mouth 3 (three) times daily. Take 400 mg by mouth two times a day, and an additional 400 mg as needed for nerve pain Patient taking differently: Take 400 mg by mouth 3 (three) times daily.  10/16/18   Julieanne MansonMulberry, Elizabeth, MD  metFORMIN (GLUCOPHAGE) 500 MG tablet Take 1 tablet (500 mg total) by mouth 2 (two) times daily with a meal. 11/13/18 03/20/19  Rhetta MuraSamtani, Jai-Gurmukh, MD    Allergies    Patient has no known allergies.  Review of Systems   Review of Systems  Constitutional: Positive for fatigue ("I'm worn out"). Negative for chills, diaphoresis and fever.  HENT: Negative.   Respiratory: Positive for shortness of breath. Negative  for cough.   Cardiovascular: Positive for leg swelling. Negative for chest pain.  Gastrointestinal: Negative for abdominal pain, nausea and vomiting.  Genitourinary: Negative.   Musculoskeletal: Negative.   Skin: Negative.   Neurological: Positive for weakness. Negative for light-headedness and headaches.    Physical Exam Updated Vital Signs BP (!) 164/68   Pulse 95   Temp 98.1 F (36.7 C) (Oral)   Resp 16   Ht 5\' 6"  (1.676 m)   Wt 103.5 kg   LMP  (LMP Unknown)   SpO2 96%   BMI 36.83 kg/m   Physical Exam Vitals and nursing note reviewed.  Constitutional:      Appearance: She is well-developed.  HENT:     Head: Normocephalic.  Cardiovascular:     Rate and Rhythm: Normal rate and regular rhythm.     Heart sounds: No murmur.  Pulmonary:     Effort: Pulmonary effort is normal.     Breath sounds: Rales (bilateral bases) present. No wheezing or rhonchi.  Abdominal:     Palpations: Abdomen is soft.     Tenderness: There is abdominal tenderness (Tender across lower abdominal pannus where mild edema is present. ). There is no guarding or rebound.  Musculoskeletal:        General: Normal range of motion.     Cervical back: Normal range of motion and neck supple.     Comments: Right BKA. There is induration of edema in left lower extremity extending to thigh, and right stump-to-thigh edema as well. There is a generalized erythema and warmth without wound, blister, drainage or bleeding.  Skin:    General: Skin is warm and dry.     Findings: No rash.  Neurological:     Mental Status: She is alert.     Cranial Nerves: No cranial nerve deficit.     ED Results / Procedures / Treatments   Labs (all labs ordered are listed, but only abnormal results are displayed) Labs Reviewed  BASIC METABOLIC PANEL - Abnormal; Notable for the following components:      Result Value   Sodium 133 (*)    Potassium  6.0 (*)    CO2 19 (*)    Glucose, Bld 100 (*)    BUN 27 (*)    Creatinine,  Ser 1.33 (*)    Calcium 8.4 (*)    GFR calc non Af Amer 46 (*)    GFR calc Af Amer 53 (*)    All other components within normal limits  CBC - Abnormal; Notable for the following components:   WBC 3.9 (*)    RBC 3.49 (*)    Hemoglobin 9.4 (*)    HCT 32.7 (*)    MCHC 28.7 (*)    RDW 18.6 (*)    All other components within normal limits  URINALYSIS, ROUTINE W REFLEX MICROSCOPIC  BRAIN NATRIURETIC PEPTIDE  HEPATIC FUNCTION PANEL  CBG MONITORING, ED  I-STAT BETA HCG BLOOD, ED ([facility], [facility], [facility] ONLY)   Results for orders placed or performed during the hospital encounter of 51/02/58  Basic metabolic panel  Result Value Ref Range   Sodium 133 (L) 135 - 145 mmol/L   Potassium 6.0 (H) 3.5 - 5.1 mmol/L   Chloride 106 98 - 111 mmol/L   CO2 19 (L) 22 - 32 mmol/L   Glucose, Bld 100 (H) 70 - 99 mg/dL   BUN 27 (H) 6 - 20 mg/dL   Creatinine, Ser 1.33 (H) 0.44 - 1.00 mg/dL   Calcium 8.4 (L) 8.9 - 10.3 mg/dL   GFR calc non Af Amer 46 (L) >60 mL/min   GFR calc Af Amer 53 (L) >60 mL/min   Anion gap 8 5 - 15  CBC  Result Value Ref Range   WBC 3.9 (L) 4.0 - 10.5 K/uL   RBC 3.49 (L) 3.87 - 5.11 MIL/uL   Hemoglobin 9.4 (L) 12.0 - 15.0 g/dL   HCT 32.7 (L) 36.0 - 46.0 %   MCV 93.7 80.0 - 100.0 fL   MCH 26.9 26.0 - 34.0 pg   MCHC 28.7 (L) 30.0 - 36.0 g/dL   RDW 18.6 (H) 11.5 - 15.5 %   Platelets 189 150 - 400 K/uL   nRBC 0.0 0.0 - 0.2 %  Urinalysis, Routine w reflex microscopic  Result Value Ref Range   Color, Urine YELLOW YELLOW   APPearance HAZY (A) CLEAR   Specific Gravity, Urine 1.005 1.005 - 1.030   pH 6.0 5.0 - 8.0   Glucose, UA NEGATIVE NEGATIVE mg/dL   Hgb urine dipstick SMALL (A) NEGATIVE   Bilirubin Urine NEGATIVE NEGATIVE   Ketones, ur NEGATIVE NEGATIVE mg/dL   Protein, ur 30 (A) NEGATIVE mg/dL   Nitrite POSITIVE (A) NEGATIVE   Leukocytes,Ua MODERATE (A) NEGATIVE   RBC / HPF 0-5 0 - 5 RBC/hpf   WBC, UA 0-5 0 - 5 WBC/hpf   Bacteria, UA MANY (A) NONE SEEN   Squamous Epithelial /  LPF 0-5 0 - 5  Brain natriuretic peptide  Result Value Ref Range   B Natriuretic Peptide 912.4 (H) 0.0 - 100.0 pg/mL  Hepatic function panel  Result Value Ref Range   Total Protein 9.7 (H) 6.5 - 8.1 g/dL   Albumin 3.2 (L) 3.5 - 5.0 g/dL   AST 52 (H) 15 - 41 U/L   ALT 8 0 - 44 U/L   Alkaline Phosphatase 62 38 - 126 U/L   Total Bilirubin 0.7 0.3 - 1.2 mg/dL   Bilirubin, Direct 0.2 0.0 - 0.2 mg/dL   Indirect Bilirubin 0.5 0.3 - 0.9 mg/dL  CBG monitoring, ED  Result Value Ref Range   Glucose-Capillary  82 70 - 99 mg/dL  I-Stat beta hCG blood, ED  Result Value Ref Range   I-stat hCG, quantitative <5.0 <5 mIU/mL   Comment 3             EKG EKG Interpretation  Date/Time:  Tuesday April 10 2019 16:12:29 EDT Ventricular Rate:  89 PR Interval:  166 QRS Duration: 70 QT Interval:  370 QTC Calculation: 450 R Axis:   54 Text Interpretation: Normal sinus rhythm Normal ECG When compared with ECG of 02/13/2019, No significant change was found Confirmed by Dione Booze ([id number]) on 04/10/2019 11:28:09 PM   Radiology No results found. DG Chest Portable 1 View  Result Date: 04/11/2019 CLINICAL DATA:  Bilateral leg swelling and pain. EXAM: PORTABLE CHEST 1 VIEW COMPARISON:  03/20/2019 FINDINGS: Cardiomegaly and symmetric basal predominant interstitial coarsening. No visible effusion or consolidation. No pneumothorax. IMPRESSION: Cardiomegaly with vascular congestion/borderline edema. Electronically Signed   By: Marnee Spring M.D.   On: 04/11/2019 04:16   DG Chest Port 1 View  Result Date: 03/20/2019 CLINICAL DATA:  Bilateral lower extremity edema for 2 weeks. Shortness of breath. EXAM: PORTABLE CHEST 1 VIEW COMPARISON:  Chest x-rays dated 02/13/2019 and 03/06/2017 and CT angiogram of the chest dated 02/13/2019 FINDINGS: Chronic cardiomegaly. Pulmonary vascularity is normal. Minimal haziness at the lung bases is probably due to a shallow inspiration. No discrete effusions. No acute bone abnormality.  Chronic indentation upon the left side of the trachea above the thoracic inlet consistent with the previously described mass of the left lobe of the thyroid gland. IMPRESSION: No acute abnormality. Chronic cardiomegaly. Electronically Signed   By: Francene Boyers M.D.   On: 03/20/2019 12:50    Procedures Procedures (including critical care time)  Medications Ordered in ED Medications  furosemide (LASIX) injection 40 mg (has no administration in time range)  sodium chloride flush (NS) 0.9 % injection 3 mL (3 mLs Intravenous Given 04/11/19 0321)    ED Course  I have reviewed the triage vital signs and the nursing notes.  Pertinent labs & imaging results that were available during my care of the patient were reviewed by me and considered in my medical decision making (see chart for details).    MDM Rules/Calculators/A&P                      Patient to ED with edema and pain as detailed in the HPI. No fever.  Exam is concerning for fluid overload. Her O2 saturation at rest is consistently 92%. No obvious SOB or difficulty speaking full sentences. There is significant edema, elevated BNP and cardiovascular congestion on CXR. No chest pain.IV Lasix 40 mg ordered to start diuresis.   U is nitrite positive with many bacteria. No leukocytes. Culture pending. Rocephin started.   Feel with her progressive symptoms, suboptimal O2 saturation, DOE, wheelchair bound, that she would benefit from admission for further diuresis. Discussed with Dr. Loney Loh who accepts for admission.    Final Clinical Impression(s) / ED Diagnoses Final diagnoses:  None   1. Peripheral edema 2. DOE 3. Nitrite positive urine  Rx / DC Orders ED Discharge Orders    None       Danne Harbor 04/11/19 0716    Dione Booze, MD 04/11/19 [phone]

## 2019-04-11 NOTE — ED Notes (Signed)
Pt's CBG result was 75. Informed Shyniece - RN.

## 2019-04-11 NOTE — ED Notes (Signed)
ED TO INPATIENT HANDOFF REPORT  ED Nurse Name and Phone #: Amiee Wiley [phone]     S Name/Age/Gender [name] 54 y.o. female Room/Bed: 005C/005C  Code Status   Code Status: Full Code  Home/SNF/Other\  Home Patient oriented to: self, place, time and situation Is this baseline? Yes   Triage Complete: Triage complete  Chief Complaint CHF (congestive heart failure) (HCC) [I50.9] Acute exacerbation of CHF (congestive heart failure) (HCC) [I50.9]  Triage Note Pt c/o bilateral leg swelling and pain, numbness in L leg increasing since February. R leg BKA  Concerned about increasing pain and swelling in stomach as well     Allergies No Known Allergies  Level of Care/Admitting Diagnosis ED Disposition    ED Disposition Condition Comment   Admit  Hospital Area: [name] [[id number]]  Level of Care: Telemetry Cardiac [[id number]]  Covid Evaluation: Asymptomatic Screening Protocol (No Symptoms)  Diagnosis: Acute exacerbation of CHF (congestive heart failure) [facility]) [[id number]]  Admitting Physician: Clydie Braun [[id number]]  Attending Physician: Clydie Braun [[id number]]  Estimated length of stay: past midnight tomorrow  Certification:: I certify this patient will need inpatient services for at least 2 midnights       B Medical/Surgery History Past Medical History:  Diagnosis Date  . Anemia   . Arthritis    "spine, hands" (11/25/2015)  . Back pain    "all my back; probably 3 times/week" (11/25/2015)  . Chronic indwelling Foley catheter 03/04/2017  . Diabetic foot ulcer associated with type 2 diabetes mellitus (HCC)   . Hypertension   . Intracerebral hemorrhage (HCC) As a teenager    States she had burst blood vessel as teenager, now with resultant minor visual field and hearing deficits  . Neuropathy   . Stroke [facility]) ~ 1982   "my feeling on my RLE, right lower eye vision,  & hearing out of my right ear not the same since" (11/25/2015)  . Type II  diabetes mellitus (HCC)    Past Surgical History:  Procedure Laterality Date  . AMPUTATION  11/24/2010   Procedure: AMPUTATION FOOT;  Surgeon: [name], MD;  Location: [facility] OR;  Service: Orthopedics;  Laterality: Right;  Right  FOOT TRANS METATARSAL AMPUTATION  . AMPUTATION  07/02/2011   Procedure: AMPUTATION BELOW KNEE;  Surgeon: [name], MD;  Location: [facility] OR;  Service: Orthopedics;  Laterality: Right;  . APPLICATION OF WOUND VAC  07/02/2011   Procedure: APPLICATION OF WOUND VAC;  Surgeon: [name], MD;  Location: [facility] OR;  Service: Orthopedics;  Laterality: Right;  . BRAIN SURGERY  1980's   Previous brain surgery in [place], [place]  . CESAREAN SECTION  2004; 2006  . COLONOSCOPY N/A 09/17/2015   Procedure: COLONOSCOPY;  Surgeon: Charlott Rakes, MD;  Location: [facility] ENDOSCOPY;  Service: Endoscopy;  Laterality: N/A;  . ESOPHAGOGASTRODUODENOSCOPY N/A 09/17/2015   Procedure: ESOPHAGOGASTRODUODENOSCOPY (EGD);  Surgeon: Charlott Rakes, MD;  Location: [facility] ENDOSCOPY;  Service: Endoscopy;  Laterality: N/A;  . I & D EXTREMITY  11/24/2010   Procedure: IRRIGATION AND DEBRIDEMENT EXTREMITY;  Surgeon: [name], MD;  Location: [facility] OR;  Service: Orthopedics;  Laterality: Left;  Debriedment of left plantar ulcer and trimming of toenails  . I & D EXTREMITY  07/02/2011   Procedure: IRRIGATION AND DEBRIDEMENT EXTREMITY;  Surgeon: [name], MD;  Location: [facility] OR;  Service: Orthopedics;  Laterality: Left;  I & D of Left Foot  . I & D EXTREMITY  07/06/2011   Procedure: IRRIGATION AND DEBRIDEMENT EXTREMITY;  Surgeon: [name], MD;  Location: [facility] OR;  Service: Orthopedics;  Laterality: Right;  IRRIGATION/DEBRIDEMENT RIGHT BELOW KNEE AMPUTATION  . TUBAL LIGATION  2006     A IV Location/Drains/Wounds Patient Lines/Drains/Airways Status   Active Line/Drains/Airways    Name:   Placement date:   Placement time:   Site:   Days:   Peripheral IV 04/11/19 Right Antecubital   04/11/19    0322    Antecubital   less  than 1   Pressure Ulcer 08/24/15 Unstageable - Full thickness tissue loss in which the base of the ulcer is covered by slough (yellow, tan, gray, green or brown) and/or eschar (tan, brown or black) in the wound bed. this is NOT a pressure injury, it is a full thi   08/24/15    0045     1326   Wound / Incision (Open or Dehisced) 01/27/18 Incision - Open;Diabetic ulcer Foot Left;Lower;Lateral blister that was opened by MD   01/27/18    1115    Foot   439          Intake/Output Last 24 hours  Intake/Output Summary (Last 24 hours) at 04/11/2019 1317 Last data filed at 04/11/2019 1117 Gross per 24 hour  Intake 100 ml  Output 1000 ml  Net -900 ml    Labs/Imaging Results for orders placed or performed during the hospital encounter of 04/10/19 (from the past 48 hour(s))  Basic metabolic panel     Status: Abnormal   Collection Time: 04/10/19  4:10 PM  Result Value Ref Range   Sodium 133 (L) 135 - 145 mmol/L   Potassium 6.0 (H) 3.5 - 5.1 mmol/L   Chloride 106 98 - 111 mmol/L   CO2 19 (L) 22 - 32 mmol/L   Glucose, Bld 100 (H) 70 - 99 mg/dL    Comment: Glucose reference range applies only to samples taken after fasting for at least 8 hours.   BUN 27 (H) 6 - 20 mg/dL   Creatinine, Ser 0.10 (H) 0.44 - 1.00 mg/dL   Calcium 8.4 (L) 8.9 - 10.3 mg/dL   GFR calc non Af Amer 46 (L) >60 mL/min   GFR calc Af Amer 53 (L) >60 mL/min   Anion gap 8 5 - 15    Comment: Performed at [facility], [address] [place], [place] [address]  CBC     Status: Abnormal   Collection Time: 04/10/19  4:10 PM  Result Value Ref Range   WBC 3.9 (L) 4.0 - 10.5 K/uL   RBC 3.49 (L) 3.87 - 5.11 MIL/uL   Hemoglobin 9.4 (L) 12.0 - 15.0 g/dL   HCT 57.3 (L) 22.0 - 25.4 %   MCV 93.7 80.0 - 100.0 fL   MCH 26.9 26.0 - 34.0 pg   MCHC 28.7 (L) 30.0 - 36.0 g/dL   RDW 27.0 (H) 62.3 - 76.2 %   Platelets 189 150 - 400 K/uL   nRBC 0.0 0.0 - 0.2 %    Comment: Performed at [facility], [address],  [place], [place] [address]  I-Stat beta hCG blood, ED     Status: None   Collection Time: 04/10/19  4:30 PM  Result Value Ref Range   I-stat hCG, quantitative <5.0 <5 mIU/mL   Comment 3            Comment:   GEST. AGE      CONC.  (mIU/mL)   <=1 WEEK  5 - 50     2 WEEKS       50 - 500     3 WEEKS       100 - 10,000     4 WEEKS     1,000 - 30,000        FEMALE AND NON-PREGNANT FEMALE:     LESS THAN 5 mIU/mL   CBG monitoring, ED     Status: None   Collection Time: 04/11/19  2:50 AM  Result Value Ref Range   Glucose-Capillary 82 70 - 99 mg/dL    Comment: Glucose reference range applies only to samples taken after fasting for at least 8 hours.  Brain natriuretic peptide     Status: Abnormal   Collection Time: 04/11/19  3:51 AM  Result Value Ref Range   B Natriuretic Peptide 912.4 (H) 0.0 - 100.0 pg/mL    Comment: Performed at [facility], [address], [place], [place] [address]  Hepatic function panel     Status: Abnormal   Collection Time: 04/11/19  3:51 AM  Result Value Ref Range   Total Protein 9.7 (H) 6.5 - 8.1 g/dL   Albumin 3.2 (L) 3.5 - 5.0 g/dL   AST 52 (H) 15 - 41 U/L   ALT 8 0 - 44 U/L   Alkaline Phosphatase 62 38 - 126 U/L   Total Bilirubin 0.7 0.3 - 1.2 mg/dL   Bilirubin, Direct 0.2 0.0 - 0.2 mg/dL   Indirect Bilirubin 0.5 0.3 - 0.9 mg/dL    Comment: Performed at [facility], [address], [place], [place] [address]  Urinalysis, Routine w reflex microscopic     Status: Abnormal   Collection Time: 04/11/19  5:19 AM  Result Value Ref Range   Color, Urine YELLOW YELLOW   APPearance HAZY (A) CLEAR   Specific Gravity, Urine 1.005 1.005 - 1.030   pH 6.0 5.0 - 8.0   Glucose, UA NEGATIVE NEGATIVE mg/dL   Hgb urine dipstick SMALL (A) NEGATIVE   Bilirubin Urine NEGATIVE NEGATIVE   Ketones, ur NEGATIVE NEGATIVE mg/dL   Protein, ur 30 (A) NEGATIVE mg/dL   Nitrite POSITIVE (A) NEGATIVE   Leukocytes,Ua MODERATE (A) NEGATIVE   RBC / HPF 0-5 0 - 5 RBC/hpf    WBC, UA 0-5 0 - 5 WBC/hpf   Bacteria, UA MANY (A) NONE SEEN   Squamous Epithelial / LPF 0-5 0 - 5    Comment: Performed at [facility], [address], [place], [place] [address]  SARS CORONAVIRUS 2 (TAT 6-24 HRS) Nasopharyngeal Nasopharyngeal Swab     Status: None   Collection Time: 04/11/19  6:26 AM   Specimen: Nasopharyngeal Swab  Result Value Ref Range   SARS Coronavirus 2 NEGATIVE NEGATIVE    Comment: (NOTE) SARS-CoV-2 target nucleic acids are NOT DETECTED. The SARS-CoV-2 RNA is generally detectable in upper and lower respiratory specimens during the acute phase of infection. Negative results do not preclude SARS-CoV-2 infection, do not rule out co-infections with other pathogens, and should not be used as the sole basis for treatment or other patient management decisions. Negative results must be combined with clinical observations, patient history, and epidemiological information. The expected result is Negative. Fact Sheet for Patients: HairSlick.no Fact Sheet for Healthcare Providers: quierodirigir.com This test is not yet approved or cleared by the [place] FDA and  has been authorized for detection and/or diagnosis of SARS-CoV-2 by FDA under an Emergency Use Authorization (EUA). This EUA  will remain  in effect (meaning this test can be used) for the duration of the COVID-19 declaration under Section 56 4(b)(1) of the Act, 21 U.S.C. section 360bbb-3(b)(1), unless the authorization is terminated or revoked sooner. Performed at [facility], [place] [address] [place], [place] [address]   CBG monitoring, ED     Status: None   Collection Time: 04/11/19  8:30 AM  Result Value Ref Range   Glucose-Capillary 75 70 - 99 mg/dL    Comment: Glucose reference range applies only to samples taken after fasting for at least 8 hours.   Comment 1 Notify RN    Comment 2 Document in Chart   CBG monitoring, ED      Status: Abnormal   Collection Time: 04/11/19 11:20 AM  Result Value Ref Range   Glucose-Capillary 113 (H) 70 - 99 mg/dL    Comment: Glucose reference range applies only to samples taken after fasting for at least 8 hours.  Potassium     Status: Abnormal   Collection Time: 04/11/19 12:03 PM  Result Value Ref Range   Potassium 5.2 (H) 3.5 - 5.1 mmol/L    Comment: Performed at [facility], [place] [address] [place], [place] [address]  TSH     Status: None   Collection Time: 04/11/19 12:03 PM  Result Value Ref Range   TSH 3.037 0.350 - 4.500 uIU/mL    Comment: Performed by a 3rd Generation assay with a functional sensitivity of <=0.01 uIU/mL. Performed at [facility], [place] [address] [place], [place] [address]    DG Chest Portable 1 View  Result Date: 04/11/2019 CLINICAL DATA:  Bilateral leg swelling and pain. EXAM: PORTABLE CHEST 1 VIEW COMPARISON:  03/20/2019 FINDINGS: Cardiomegaly and symmetric basal predominant interstitial coarsening. No visible effusion or consolidation. No pneumothorax. IMPRESSION: Cardiomegaly with vascular congestion/borderline edema. Electronically Signed   By: Monte Fantasia M.D.   On: 04/11/2019 04:16   VAS [place] LOWER EXTREMITY VENOUS (DVT)  Result Date: 04/11/2019  Lower Venous DVTStudy Indications: Swelling, and Pain.  Limitations: Poor ultrasound/tissue interface, body habitus and rt bka. Comparison Study: 01/26/18 previous Performing Technologist: Abram Sander RVS  Examination Guidelines: A complete evaluation includes B-mode imaging, spectral Doppler, color Doppler, and power Doppler as needed of all accessible portions of each vessel. Bilateral testing is considered an integral part of a complete examination. Limited examinations for reoccurring indications may be performed as noted. The reflux portion of the exam is performed with the patient in reverse Trendelenburg.  +---------+---------------+---------+-----------+----------+--------------+ RIGHT     CompressibilityPhasicitySpontaneityPropertiesThrombus Aging +---------+---------------+---------+-----------+----------+--------------+ CFV      Full           Yes      Yes                                 +---------+---------------+---------+-----------+----------+--------------+ SFJ      Full                                                        +---------+---------------+---------+-----------+----------+--------------+ FV Prox  Full                                                        +---------+---------------+---------+-----------+----------+--------------+  FV Mid                  Yes      Yes                                 +---------+---------------+---------+-----------+----------+--------------+ FV Distal                                             Not visualized +---------+---------------+---------+-----------+----------+--------------+ PFV      Full                                                        +---------+---------------+---------+-----------+----------+--------------+ POP      Full           Yes      Yes                                 +---------+---------------+---------+-----------+----------+--------------+ PTV                                                   bka            +---------+---------------+---------+-----------+----------+--------------+ PERO                                                  bka            +---------+---------------+---------+-----------+----------+--------------+   +---------+---------------+---------+-----------+----------+--------------+ LEFT     CompressibilityPhasicitySpontaneityPropertiesThrombus Aging +---------+---------------+---------+-----------+----------+--------------+ CFV      Full           Yes      Yes                                 +---------+---------------+---------+-----------+----------+--------------+ SFJ      Full                                                         +---------+---------------+---------+-----------+----------+--------------+ FV Prox  Full                                                        +---------+---------------+---------+-----------+----------+--------------+ FV Mid                  Yes      Yes                                 +---------+---------------+---------+-----------+----------+--------------+  FV Distal               Yes      Yes                                 +---------+---------------+---------+-----------+----------+--------------+ PFV      Full                                                        +---------+---------------+---------+-----------+----------+--------------+ POP      Full           Yes      Yes                                 +---------+---------------+---------+-----------+----------+--------------+ PTV      Full                                                        +---------+---------------+---------+-----------+----------+--------------+     Summary: RIGHT: - There is no evidence of deep vein thrombosis in the lower extremity. However, portions of this examination were limited- see technologist comments above.  - No cystic structure found in the popliteal fossa.  LEFT: - There is no evidence of deep vein thrombosis in the lower extremity.  - No cystic structure found in the popliteal fossa.  *See table(s) above for measurements and observations.    Preliminary     Pending Labs Unresulted Labs (From admission, onward)    Start     Ordered   04/12/19 0500  Basic metabolic panel  Daily,   R     04/11/19 0753   04/12/19 0500  CBC WITH DIFFERENTIAL  Tomorrow morning,   R     04/11/19 0753   04/11/19 0749  Hemoglobin A1c  Once,   STAT    Comments: To assess prior glycemic control    04/11/19 0753   04/11/19 0549  Urine culture  ONCE - STAT,   STAT     04/11/19 0548          Vitals/Pain Today's Vitals   04/11/19 1100 04/11/19 1116 04/11/19 1300  04/11/19 1316  BP: (!) 151/64  (!) 149/103   Pulse: 95  94   Resp: 17  20   Temp:    97.9 F (36.6 C)  TempSrc:    Oral  SpO2: 94%  93%   Weight:      Height:      PainSc:  7       Isolation Precautions No active isolations  Medications Medications  aspirin EC tablet 81 mg (has no administration in time range)  gabapentin (NEURONTIN) capsule 400 mg (400 mg Oral Given 04/11/19 0918)  sodium chloride flush (NS) 0.9 % injection 3 mL (3 mLs Intravenous Given 04/11/19 0919)  sodium chloride flush (NS) 0.9 % injection 3 mL (has no administration in time range)  0.9 %  sodium chloride infusion (has no administration in time range)  acetaminophen (TYLENOL) tablet 650 mg (650 mg Oral Given 04/11/19 0918)  ondansetron (ZOFRAN) injection  4 mg (has no administration in time range)  enoxaparin (LOVENOX) injection 40 mg (has no administration in time range)  furosemide (LASIX) injection 40 mg (40 mg Intravenous Given 04/11/19 0842)  insulin aspart (novoLOG) injection 0-9 Units (0 Units Subcutaneous Not Given 04/11/19 1120)  gabapentin (NEURONTIN) capsule 400 mg (has no administration in time range)  HYDROcodone-acetaminophen (NORCO/VICODIN) 5-325 MG per tablet 1 tablet (has no administration in time range)  cefTRIAXone (ROCEPHIN) 1 g in sodium chloride 0.9 % 100 mL IVPB (has no administration in time range)  sodium chloride flush (NS) 0.9 % injection 3 mL (3 mLs Intravenous Given 04/11/19 0321)  furosemide (LASIX) injection 40 mg (40 mg Intravenous Given 04/11/19 0414)  cefTRIAXone (ROCEPHIN) 1 g in sodium chloride 0.9 % 100 mL IVPB (0 g Intravenous Stopped 04/11/19 0454)    Mobility non-ambulatory Moderate fall risk   Focused Assessments gu/gi   R Recommendations: See Admitting Provider Note  Report given to:   Additional Notes:  Peripheral edema, diuresing with lasix.

## 2019-04-12 ENCOUNTER — Telehealth: Payer: Self-pay | Admitting: Hematology and Oncology

## 2019-04-12 DIAGNOSIS — N1831 Chronic kidney disease, stage 3a: Secondary | ICD-10-CM | POA: Diagnosis not present

## 2019-04-12 DIAGNOSIS — I509 Heart failure, unspecified: Secondary | ICD-10-CM | POA: Diagnosis not present

## 2019-04-12 DIAGNOSIS — I5033 Acute on chronic diastolic (congestive) heart failure: Secondary | ICD-10-CM

## 2019-04-12 LAB — HEMOGLOBIN A1C
Hgb A1c MFr Bld: 5.3 % (ref 4.8–5.6)
Mean Plasma Glucose: 105 mg/dL

## 2019-04-12 LAB — CBC WITH DIFFERENTIAL/PLATELET
Abs Immature Granulocytes: 0.01 10*3/uL (ref 0.00–0.07)
Basophils Absolute: 0 10*3/uL (ref 0.0–0.1)
Basophils Relative: 1 %
Eosinophils Absolute: 0.1 10*3/uL (ref 0.0–0.5)
Eosinophils Relative: 2 %
HCT: 31 % — ABNORMAL LOW (ref 36.0–46.0)
Hemoglobin: 9.2 g/dL — ABNORMAL LOW (ref 12.0–15.0)
Immature Granulocytes: 0 %
Lymphocytes Relative: 20 %
Lymphs Abs: 0.6 10*3/uL — ABNORMAL LOW (ref 0.7–4.0)
MCH: 27.3 pg (ref 26.0–34.0)
MCHC: 29.7 g/dL — ABNORMAL LOW (ref 30.0–36.0)
MCV: 92 fL (ref 80.0–100.0)
Monocytes Absolute: 0.5 10*3/uL (ref 0.1–1.0)
Monocytes Relative: 17 %
Neutro Abs: 1.8 10*3/uL (ref 1.7–7.7)
Neutrophils Relative %: 60 %
Platelets: 208 10*3/uL (ref 150–400)
RBC: 3.37 MIL/uL — ABNORMAL LOW (ref 3.87–5.11)
RDW: 18.6 % — ABNORMAL HIGH (ref 11.5–15.5)
WBC: 3 10*3/uL — ABNORMAL LOW (ref 4.0–10.5)
nRBC: 0 % (ref 0.0–0.2)

## 2019-04-12 LAB — BASIC METABOLIC PANEL
Anion gap: 7 (ref 5–15)
BUN: 24 mg/dL — ABNORMAL HIGH (ref 6–20)
CO2: 24 mmol/L (ref 22–32)
Calcium: 8.4 mg/dL — ABNORMAL LOW (ref 8.9–10.3)
Chloride: 104 mmol/L (ref 98–111)
Creatinine, Ser: 1.39 mg/dL — ABNORMAL HIGH (ref 0.44–1.00)
GFR calc Af Amer: 50 mL/min — ABNORMAL LOW (ref >60)
GFR calc non Af Amer: 43 mL/min — ABNORMAL LOW (ref >60)
Glucose, Bld: 89 mg/dL (ref 70–99)
Potassium: 4.7 mmol/L (ref 3.5–5.1)
Sodium: 135 mmol/L (ref 135–145)

## 2019-04-12 LAB — GLUCOSE, CAPILLARY
Glucose-Capillary: 89 mg/dL (ref 70–99)
Glucose-Capillary: 75 mg/dL (ref 70–99)
Glucose-Capillary: 99 mg/dL (ref 70–99)
Glucose-Capillary: 115 mg/dL — ABNORMAL HIGH (ref 70–99)
Glucose-Capillary: 138 mg/dL — ABNORMAL HIGH (ref 70–99)

## 2019-04-12 MED ORDER — AMLODIPINE BESYLATE 10 MG PO TABS
10.0000 mg | ORAL_TABLET | Freq: Every day | ORAL | Status: DC
  Administered 2019-04-12 – 2019-04-18 (×7): 10 mg via ORAL
  Filled 2019-04-12 (×8): qty 1

## 2019-04-12 NOTE — Plan of Care (Signed)
Nutrition Education Note  RD consulted for nutrition education regarding CHF.  Spoke with pt at bedside, who reports that she had a decreased appetite over the past week secondary to fluid overload. Pt shares that she was admitted to the hospital for similar issues in 2017 and feels she has been controlling her heart failure well up until recently. She also shares that her medical insurance was recently reinstated so she can now better afford her medications and medical care.   Pt consumes 1 meal per day PTA (sandwich) and snacks on fruit and chips throughout the day. She was able to identify healthier snack items and lean proteins.  RD provided "Low Sodium Nutrition Therapy" handout from the Academy of Nutrition and Dietetics. Reviewed patient's dietary recall. Provided examples on ways to decrease sodium intake in diet. Discouraged intake of processed foods and use of salt shaker. Encouraged fresh fruits and vegetables as well as whole grain sources of carbohydrates to maximize fiber intake.   RD discussed why it is important for patient to adhere to diet recommendations, and emphasized the role of fluids, foods to avoid, and importance of weighing self daily. Teach back method used.  Expect fair compliance.  Body mass index is 40.64 kg/m. Pt meets criteria for extreme obesity, class III based on current BMI. Obesity is a complex, chronic medical condition that is optimally managed by a multidisciplinary care team. Weight loss is not an ideal goal for an acute inpatient hospitalization. However, if further work-up for obesity is warranted, consider outpatient referral to outpatient bariatric service and/or [facility].    Current diet order is heart healthy/ carb modified, patient is consuming approximately 100% of meals at this time. Labs and medications reviewed. No further nutrition interventions warranted at this time. RD contact information  provided. If additional nutrition issues arise, please re-consult RD.   Levada Schilling, RD, LDN, CDCES Registered Dietitian II Certified Diabetes Care and Education Specialist Please refer to [facility] for RD and/or RD on-call/weekend/after hours pager

## 2019-04-12 NOTE — Discharge Instructions (Signed)

## 2019-04-12 NOTE — Telephone Encounter (Signed)
Scheduled appt per  4/8 sch message - unable to reach pt .left message with appt date and time   

## 2019-04-12 NOTE — Plan of Care (Signed)

## 2019-04-12 NOTE — Progress Notes (Signed)
PROGRESS NOTE    [name]  ZOX:[id number] DOB: 09-Nov-1965 DOA: 04/10/2019 PCP: Patient, No Pcp Per  Brief Narrative:[name] is a 54 y.o. female with medical history significant of hypertension, DM type II, anemia, ITP, s/p right BKA presented with complaints of left leg pain and swelling over the last 3 weeks.  Swelling actually goes up into her abdomen.  -  Evaluated on 3/16 in the emergency department for the same.  It appears BNP was elevated at 627.6 at that time and she was given 80 mg of Lasix along with a short prescription for Lasix 20 mg daily.  - She tried to follow-up with her primary care provider but had not been able to get an appointment yet.  Due to worsening symptoms she came to the hospital for further evaluation.  -In the emergency room she was noted to have creatinine of 1.3, BNP of 912, chest x-ray noted cardiomegaly and vascular congestion, urinalysis was abnormal  Assessment & Plan:   Acute diastolic CHF -Presenting with fluid overload, echocardiogram with preserved EF, unable to assess RV and diastolic function -Clinically volume overloaded, continue IV Lasix today -Monitor I's/O, daily weights -Wean O2 as tolerated -Dietitian consult -Case management consult, patient has limited social support with bilateral cataracts, BKA and multiple medical problems  Hyperkalemia:  -Resolved with Lasix, monitor  Urinary tract infection: Acute.  Patient reports having some discomfort with urinating.  UA positive for signs of infection. -Continue IV ceftriaxone, follow-up urine culture  Chronic kidney disease stage IIIa: Patient creatinine on admission noted to be 1.33.  Appears along patient's baseline which ranges from 1.3-1.6. -Stable, monitor with diuresis  Diabetes mellitus type 2: Home medications include Metformin 500 mg daily.  -CBG stable, check A1c  Obesity -BMI 40.6  Essential hypertension: Home blood pressure medications include  amlodipine 10 mg daily. -Held amlodipine on admission, monitor with diuresis  History of ITP: Platelet counts currently within normal limits.  -Followed by Dr. Pamelia Hoit of hematology and treated with IVIG -Continue outpatient follow-up  Cataracts: Patient reports that she is in the process of being set up in outpatient setting for removal of the cataracts. -Continue outpatient follow-up, case management assistance  Status post BKA: Patient reports difficulty ambulating due to swelling. -PT to eval  DVT prophylaxis: Lovenox Code Status: Full Family Communication:  Discussed patient in detail Disposition Plan:  Home pending improvement in volume status, adequate diuresis, likely 48 hours   Consultants:      Procedures:   Antimicrobials:    Subjective: -Feels better today, breathing is improving  Objective: Vitals:   04/12/19 0508 04/12/19 0512 04/12/19 0715 04/12/19 0933  BP:  137/65 138/71 (!) 149/72  Pulse:  84 84 91  Resp:  Temp:  98.5 F (36.9 C)  98.6 F (37 C)  TempSrc:  Oral  Oral  SpO2:  91% 97% 96%  Weight: 114.2 kg     Height:        Intake/Output Summary (Last 24 hours) at 04/12/2019 1205 Last data filed at 04/12/2019 0900 Gross per 24 hour  Intake 510.26 ml  Output 2400 ml  Net -1889.74 ml   Filed Weights   04/10/19 1608 04/12/19 0508  Weight: 103.5 kg 114.2 kg    Examination:  Gen: Awake, Alert, Oriented X 3, obese female, sitting up in bed, no distress HEENT: Obese, positive JVD Lungs: Few basilar CVS: S1-S2, regular rate rhythm Abd: soft, Non tender, non distended, BS present Extremities: Right  BKA, 2+ edema Skin: no new rashes Psychiatry: Judgement and insight appear normal. Mood & affect appropriate.   Data Reviewed:   CBC: Recent Labs  Lab 04/10/19 1610 04/12/19 0517  WBC 3.9* 3.0*  NEUTROABS  --  1.8  HGB 9.4* 9.2*  HCT 32.7* 31.0*  MCV 93.7 92.0  PLT 189 208   Basic Metabolic Panel: Recent Labs  Lab  04/10/19 1610 04/11/19 1203 04/12/19 0517  NA 133*  --  135  K 6.0* 5.2* 4.7  CL 106  --  104  CO2 19*  --  24  GLUCOSE 100*  --  89  BUN 27*  --  24*  CREATININE 1.33*  --  1.39*  CALCIUM 8.4*  --  8.4*   GFR: Estimated Creatinine Clearance: 60.8 mL/min (A) (by C-G formula based on SCr of 1.39 mg/dL (H)). Liver Function Tests: Recent Labs  Lab 04/11/19 0351  AST 52*  ALT 8  ALKPHOS 62  BILITOT 0.7  PROT 9.7*  ALBUMIN 3.2*   No results for input(s): LIPASE, AMYLASE in the last 168 hours. No results for input(s): AMMONIA in the last 168 hours. Coagulation Profile: No results for input(s): INR, PROTIME in the last 168 hours. Cardiac Enzymes: No results for input(s): CKTOTAL, CKMB, CKMBINDEX, TROPONINI in the last 168 hours. BNP (last 3 results) No results for input(s): PROBNP in the last 8760 hours. HbA1C: Recent Labs    04/11/19 0851  HGBA1C 5.3   CBG: Recent Labs  Lab 04/11/19 1120 04/11/19 1700 04/11/19 2119 04/12/19 0556 04/12/19 0713  GLUCAP 113* 128* 166* 89 75   Lipid Profile: No results for input(s): CHOL, HDL, LDLCALC, TRIG, CHOLHDL, LDLDIRECT in the last 72 hours. Thyroid Function Tests: Recent Labs    04/11/19 1203  TSH 3.037   Anemia Panel: No results for input(s): VITAMINB12, FOLATE, FERRITIN, TIBC, IRON, RETICCTPCT in the last 72 hours. Urine analysis:    Component Value Date/Time   COLORURINE YELLOW 04/11/2019 0519   APPEARANCEUR HAZY (A) 04/11/2019 0519   LABSPEC 1.005 04/11/2019 0519   PHURINE 6.0 04/11/2019 0519   GLUCOSEU NEGATIVE 04/11/2019 0519   HGBUR SMALL (A) 04/11/2019 0519   BILIRUBINUR NEGATIVE 04/11/2019 0519   KETONESUR NEGATIVE 04/11/2019 0519   PROTEINUR 30 (A) 04/11/2019 0519   UROBILINOGEN 2.0 (H) 07/08/2011 0051   NITRITE POSITIVE (A) 04/11/2019 0519   LEUKOCYTESUR MODERATE (A) 04/11/2019 0519   Sepsis Labs: @LABRCNTIP (procalcitonin:4,lacticidven:4)  ) Recent Results (from the past 240 hour(s))  Urine  culture     Status: Abnormal (Preliminary result)   Collection Time: 04/11/19  6:08 AM   Specimen: Urine, Random  Result Value Ref Range Status   Specimen Description URINE, RANDOM  Final   Special Requests   Final    NONE Performed at [facility], [address] [place], [place] [address]    Culture >=100,000 COLONIES/mL GRAM NEGATIVE RODS (A)  Final   Report Status PENDING  Incomplete  SARS CORONAVIRUS 2 (TAT 6-24 HRS) Nasopharyngeal Nasopharyngeal Swab     Status: None   Collection Time: 04/11/19  6:26 AM   Specimen: Nasopharyngeal Swab  Result Value Ref Range Status   SARS Coronavirus 2 NEGATIVE NEGATIVE Final    Comment: (NOTE) SARS-CoV-2 target nucleic acids are NOT DETECTED. The SARS-CoV-2 RNA is generally detectable in upper and lower respiratory specimens during the acute phase of infection. Negative results do not preclude SARS-CoV-2 infection, do not rule out co-infections with other pathogens, and should not be used  as the sole basis for treatment or other patient management decisions. Negative results must be combined with clinical observations, patient history, and epidemiological information. The expected result is Negative. Fact Sheet for Patients: HairSlick.no Fact Sheet for Healthcare Providers: quierodirigir.com This test is not yet approved or cleared by the [place] FDA and  has been authorized for detection and/or diagnosis of SARS-CoV-2 by FDA under an Emergency Use Authorization (EUA). This EUA will remain  in effect (meaning this test can be used) for the duration of the COVID-19 declaration under Section 56 4(b)(1) of the Act, 21 U.S.C. section 360bbb-3(b)(1), unless the authorization is terminated or revoked sooner. Performed at [facility], [address] [place], [place] [address]          Radiology Studies: DG Chest Portable 1 View  Result Date: 04/11/2019 CLINICAL  DATA:  Bilateral leg swelling and pain. EXAM: PORTABLE CHEST 1 VIEW COMPARISON:  03/20/2019 FINDINGS: Cardiomegaly and symmetric basal predominant interstitial coarsening. No visible effusion or consolidation. No pneumothorax. IMPRESSION: Cardiomegaly with vascular congestion/borderline edema. Electronically Signed   By: Marnee Spring M.D.   On: 04/11/2019 04:16   ECHOCARDIOGRAM COMPLETE  Result Date: 04/11/2019    ECHOCARDIOGRAM REPORT   Patient Name:   KATHALEEN DUDZIAK Date of Exam: 04/11/2019 Medical Rec #:  [id number]         Height:       66.0 in Accession #:    [id number]        Weight:       228.2 lb Date of Birth:  28-Apr-1965         BSA:          2.115 m Patient Age:    53 years          BP:           151/64 mmHg Patient Gender: F                 HR:           90 bpm. Exam Location:  Inpatient Procedure: 2D Echo, Cardiac Doppler and Color Doppler Indications:    CHF-Acute Diastolic 428.31/I50.31  History:        Patient has prior history of Echocardiogram examinations, most                 recent 03-14-66. CHF; Risk Factors:Diabetes and Hypertension.                 CKD.  Sonographer:    Ross Ludwig RDCS (AE) Referring Phys: [id number] [facility] SM[facility]  Sonographer Comments: Image acquisition challenging due to uncooperative patient. Patient refused remainder of test after finishing PLAX and SAX images, stating that she "didn't know it was going to be all this." She also did not want to remover her bra or get gel on her bra. IMPRESSIONS  1. Left ventricular ejection fraction, by estimation, is 65 to 70%. The left ventricle has normal function. The left ventricle has no regional wall motion abnormalities. There is moderate left ventricular hypertrophy. Left ventricular diastolic function  could not be evaluated.  2. Right ventricular systolic function was not well visualized. The right ventricular size is not well visualized.  3. The mitral valve is grossly normal. Trivial mitral valve regurgitation.  4. The  aortic valve was not well visualized. Aortic valve regurgitation is not visualized. FINDINGS  Left Ventricle: Left ventricular ejection fraction, by estimation, is 65 to 70%. The left ventricle has normal function.  The left ventricle has no regional wall motion abnormalities. The left ventricular internal cavity size was normal in size. There is  moderate left ventricular hypertrophy. Left ventricular diastolic function could not be evaluated. Right Ventricle: The right ventricular size is not well visualized. Right vetricular wall thickness was not assessed. Right ventricular systolic function was not well visualized. Left Atrium: Left atrial size was not well visualized. Right Atrium: Right atrial size was not well visualized. Pericardium: There is no evidence of pericardial effusion. Mitral Valve: The mitral valve is grossly normal. Trivial mitral valve regurgitation. Tricuspid Valve: The tricuspid valve is grossly normal. Tricuspid valve regurgitation is mild. Aortic Valve: The aortic valve was not well visualized. Aortic valve regurgitation is not visualized. Pulmonic Valve: The pulmonic valve was normal in structure. Pulmonic valve regurgitation is not visualized. Aorta: The aortic root is normal in size and structure. IAS/Shunts: The interatrial septum was not well visualized.  LEFT VENTRICLE PLAX 2D LVIDd:         5.60 cm LVIDs:         3.10 cm LV PW:         1.30 cm LV IVS:        1.20 cm LVOT diam:     2.20 cm LVOT Area:     3.80 cm  LEFT ATRIUM         Index LA diam:    3.00 cm 1.42 cm/m   AORTA Ao Root diam: 3.00 cm TRICUSPID VALVE TR Peak grad:   28.9 mmHg TR Vmax:        269.00 cm/s  SHUNTS Systemic Diam: 2.20 cm Zoila Shutter MD Electronically signed by Zoila Shutter MD Signature Date/Time: 04/11/2019/4:16:37 PM    Final    VAS [place] LOWER EXTREMITY VENOUS (DVT)  Result Date: 04/11/2019  Lower Venous DVTStudy Indications: Swelling, and Pain.  Limitations: Poor ultrasound/tissue interface, body habitus  and rt bka. Comparison Study: 01/26/18 previous Performing Technologist: Blanch Media RVS  Examination Guidelines: A complete evaluation includes B-mode imaging, spectral Doppler, color Doppler, and power Doppler as needed of all accessible portions of each vessel. Bilateral testing is considered an integral part of a complete examination. Limited examinations for reoccurring indications may be performed as noted. The reflux portion of the exam is performed with the patient in reverse Trendelenburg.  +---------+---------------+---------+-----------+----------+--------------+ RIGHT    CompressibilityPhasicitySpontaneityPropertiesThrombus Aging +---------+---------------+---------+-----------+----------+--------------+ CFV      Full           Yes      Yes                                 +---------+---------------+---------+-----------+----------+--------------+ SFJ      Full                                                        +---------+---------------+---------+-----------+----------+--------------+ FV Prox  Full                                                        +---------+---------------+---------+-----------+----------+--------------+ FV Mid  Yes      Yes                                 +---------+---------------+---------+-----------+----------+--------------+ FV Distal                                             Not visualized +---------+---------------+---------+-----------+----------+--------------+ PFV      Full                                                        +---------+---------------+---------+-----------+----------+--------------+ POP      Full           Yes      Yes                                 +---------+---------------+---------+-----------+----------+--------------+ PTV                                                   bka            +---------+---------------+---------+-----------+----------+--------------+  PERO                                                  bka            +---------+---------------+---------+-----------+----------+--------------+   +---------+---------------+---------+-----------+----------+--------------+ LEFT     CompressibilityPhasicitySpontaneityPropertiesThrombus Aging +---------+---------------+---------+-----------+----------+--------------+ CFV      Full           Yes      Yes                                 +---------+---------------+---------+-----------+----------+--------------+ SFJ      Full                                                        +---------+---------------+---------+-----------+----------+--------------+ FV Prox  Full                                                        +---------+---------------+---------+-----------+----------+--------------+ FV Mid                  Yes      Yes                                 +---------+---------------+---------+-----------+----------+--------------+ FV Distal  Yes      Yes                                 +---------+---------------+---------+-----------+----------+--------------+ PFV      Full                                                        +---------+---------------+---------+-----------+----------+--------------+ POP      Full           Yes      Yes                                 +---------+---------------+---------+-----------+----------+--------------+ PTV      Full                                                        +---------+---------------+---------+-----------+----------+--------------+     Summary: RIGHT: - There is no evidence of deep vein thrombosis in the lower extremity. However, portions of this examination were limited- see technologist comments above.  - No cystic structure found in the popliteal fossa.  LEFT: - There is no evidence of deep vein thrombosis in the lower extremity.  - No cystic structure found in the  popliteal fossa.  *See table(s) above for measurements and observations. Electronically signed by Servando Snare MD on 04/11/2019 at 5:12:48 PM.    Final         Scheduled Meds: . enoxaparin (LOVENOX) injection  40 mg Subcutaneous Q24H  . furosemide  40 mg Intravenous BID  . gabapentin  400 mg Oral BID  . insulin aspart  0-9 Units Subcutaneous TID WC  . sodium chloride flush  3 mL Intravenous Q12H   Continuous Infusions: . sodium chloride Stopped (04/12/19 0602)  . cefTRIAXone (ROCEPHIN)  IV 1 g (04/12/19 0602)     LOS: 1 day    Time spent: 54min  Domenic Polite, MD [facility]  04/12/2019, 12:05 PM

## 2019-04-12 NOTE — Evaluation (Signed)
Physical Therapy Evaluation Patient Details Name: [name] MRN: [id number] DOB: 1965-03-29 Today's Date: 04/12/2019   History of Present Illness  54 yo admitted with bil LE edema and CHF. PMHx: Rt BKA, Dm, HTN, neuropathy  Clinical Impression  Pt pleasant with low vision and reports progressive loss of vision for 2 years and progressive functional decline for a year. Pt states she was given a prosthesis in 2013 but never taught to use it and used to be able to hop to bathroom but has progressive lost that ability as well. Pt with decreased strength, transfers, cognition and safety awareness who will benefit from acute therapy to maximize mobility and safety. Pt states she will not consider ALF or SNF but noted to have problem solving and safety deficits not appropriate for home alone in current state. Discussed with RN who has also noted the same.       Follow Up Recommendations Supervision/Assistance - 24 hour;SNF(pt reports she will decline SNF or ALF but due to cognition needs additional support. Is [facility] an option if she refuses?)    Equipment Recommendations  None recommended by PT    Recommendations for Other Services OT consult     Precautions / Restrictions Precautions Precautions: Fall Precaution Comments: pt with very low vision and reports progressive decline in vision for 2 years. Has a prosthesis but never used it      Mobility  Bed Mobility Overal bed mobility: Needs Assistance Bed Mobility: Supine to Sit     Supine to sit: Modified independent (Device/Increase time);HOB elevated     General bed mobility comments: HOb 40 degrees with cues and increased time. Pt with difficulty recalling how she gets up at home  Transfers Overall transfer level: Needs assistance   Transfers: Sit to/from [employer] Transfers Sit to Stand: Min guard   Squat pivot transfers: Min guard     General transfer comment: required repeated questions and cues  for pt to determine how to setup room for transfers as at home. close guarding as pt stating she was unsure if she could transfer with pivot toward right as she does at home  Ambulation/Gait             General Gait Details: does not ambulate at baseline  Stairs            Wheelchair Mobility    Modified Rankin (Stroke Patients Only)       Balance Overall balance assessment: Needs assistance   Sitting balance-Leahy Scale: Fair     Standing balance support: Bilateral upper extremity supported Standing balance-Leahy Scale: Poor                               Pertinent Vitals/Pain Pain Assessment: 0-10 Pain Score: 4  Pain Location: LLE Pain Descriptors / Indicators: Aching;Sore Pain Intervention(s): Limited activity within patient's tolerance;Repositioned    Home Living Family/patient expects to be discharged to:: Private residence Living Arrangements: Alone Available Help at Discharge: Family;Available PRN/intermittently Type of Home: House Home Access: Stairs to enter   Entrance Stairs-Number of Steps: 1 Home Layout: Two level;1/2 bath on main level Home Equipment: Walker - 2 wheels;Bedside commode;Wheelchair - manual;Hospital bed Additional Comments: family or friends will bump her up the step    Prior Function Level of Independence: Needs assistance   Gait / Transfers Assistance Needed: pt reports she has progressively declined over the last year and can no longer hop  only transfers to [facility]  ADL's / Homemaking Assistance Needed: sponge bathes as no tub/shower downstairs, easy prep meals, family does the shopping        Hand Dominance        Extremity/Trunk Assessment   Upper Extremity Assessment Upper Extremity Assessment: Generalized weakness    Lower Extremity Assessment Lower Extremity Assessment: Generalized weakness    Cervical / Trunk Assessment Cervical / Trunk Assessment: Kyphotic  Communication   Communication: No  difficulties  Cognition Arousal/Alertness: Awake/alert Behavior During Therapy: WFL for tasks assessed/performed Overall Cognitive Status: Impaired/Different from baseline Area of Impairment: Memory;Safety/judgement;Problem solving                     Memory: Decreased short-term memory   Safety/Judgement: Decreased awareness of safety;Decreased awareness of deficits   Problem Solving: Slow processing General Comments: pt unaware of CHF or why she has edema, unaware of wounds/scratches to all limbs, increased time to process and answer all questions and great difficulty comprehending education and safety information      General Comments      Exercises     Assessment/Plan    PT Assessment Patient needs continued PT services  PT Problem List Decreased mobility;Decreased safety awareness;Decreased activity tolerance;Decreased balance;Decreased knowledge of use of DME;Impaired sensation;Decreased cognition;Decreased skin integrity       PT Treatment Interventions DME instruction;Therapeutic exercise;Functional mobility training;Therapeutic activities;Patient/family education;Cognitive remediation;Wheelchair mobility training    PT Goals (Current goals can be found in the Care Plan section)  Acute Rehab PT Goals Patient Stated Goal: return to my house PT Goal Formulation: With patient Time For Goal Achievement: 04/26/19 Potential to Achieve Goals: Fair    Frequency Min 3X/week   Barriers to discharge Decreased caregiver support      Co-evaluation               AM-PAC PT "6 Clicks" Mobility  Outcome Measure Help needed turning from your back to your side while in a flat bed without using bedrails?: A Little Help needed moving from lying on your back to sitting on the side of a flat bed without using bedrails?: A Little Help needed moving to and from a bed to a chair (including a wheelchair)?: A Little Help needed standing up from a chair using your arms  (e.g., wheelchair or bedside chair)?: A Little Help needed to walk in hospital room?: Total Help needed climbing 3-5 steps with a railing? : Total 6 Click Score: 14    End of Session   Activity Tolerance: Patient tolerated treatment well Patient left: in chair;with call bell/phone within reach;with chair alarm set Nurse Communication: Mobility status;Precautions PT Visit Diagnosis: Other abnormalities of gait and mobility (R26.89);Muscle weakness (generalized) (M62.81)    Time: 8850-2774 PT Time Calculation (min) (ACUTE ONLY): 34 min   Charges:   PT Evaluation $PT Eval Moderate Complexity: 1 Mod PT Treatments $Therapeutic Activity: 8-22 mins        Tonny Isensee P, PT Acute Rehabilitation Services Pager: [phone] Office: [phone]   Sandy Salaam Skye Plamondon 04/12/2019, 12:58 PM

## 2019-04-12 NOTE — Consult Note (Addendum)
Cardiology Consultation:   Patient ID: [name]; [id number]; 04-Feb-1965   Admit date: 04/10/2019 Date of Consult: 04/12/2019  Primary Care Provider: Patient, No Pcp Per Primary Cardiologist: New to [facility]  Patient Profile:   [name] is a 54 y.o. female with a hx of hypertension, DM type II, anemia, ITP and s/p right BKA who is being seen today for the evaluation of CHF at the request of Dr. Tamala Julian.  History of Present Illness:   [name] is a 54 year old female with a history stated above who presented to [facility] on 04/11/2019 with left leg pain and LE swelling for the last 3 weeks. Due to her symptoms, she has been unable to ambulate or do much of any activity. She was recently evaluated in the emergency department on 03/20/2019 for similar symptoms at which time BNP was elevated at 627 therefore she was given 80 mg of IV Lasix along with an abbreviated prescription for Lasix 20 mg p.o. daily x four doses.  She reports adequate response with the IV diuretic however not much effect with the p.o. dosing.  She attempted to follow with her primary care provider however has been unable to obtain an appointment.  Given her worsening symptoms, she proceeded to the ED for further evaluation. On ED arrival, BPs were stable. BNP found to be more elevated at 912.  CXR with cardiomegaly and vascular congestion.  Patient was started on Rocephin for positive UA as well as 40 mg IV Lasix for fluid volume overload.  Potassium was found to be > 6.0. Echocardiogram performed 04/11/2019 with LVEF at 65 to 70% with moderate LVH and diastolic dysfunction.  No valvular disease noted.  Her lower extremity leg pain, lower extremity vascular Doppler to rule out DVT was performed which was negative.  She was admitted to hospitalist service with cardiology consultation for further management.  Past Medical History:  Diagnosis Date  . Acute exacerbation of CHF (congestive heart failure) ([place]) 04/11/2019  .  Anemia   . Arthritis    "spine, hands" (11/25/2015)  . Back pain    "all my back; probably 3 times/week" (11/25/2015)  . Chronic indwelling Foley catheter 03/04/2017  . Diabetic foot ulcer associated with type 2 diabetes mellitus ([place])   . Hypertension   . Intracerebral hemorrhage ([place]) As a teenager    States she had burst blood vessel as teenager, now with resultant minor visual field and hearing deficits  . Neuropathy   . Stroke [facility]) ~ 1982   "my feeling on my RLE, right lower eye vision,  & hearing out of my right ear not the same since" (11/25/2015)  . Type II diabetes mellitus ([place])     Past Surgical History:  Procedure Laterality Date  . AMPUTATION  11/24/2010   Procedure: AMPUTATION FOOT;  Surgeon: Wylene Simmer, MD;  Location: [place];  Service: Orthopedics;  Laterality: Right;  Right  FOOT TRANS METATARSAL AMPUTATION  . AMPUTATION  07/02/2011   Procedure: AMPUTATION BELOW KNEE;  Surgeon: Wylene Simmer, MD;  Location: [place];  Service: Orthopedics;  Laterality: Right;  . APPLICATION OF WOUND VAC  07/02/2011   Procedure: APPLICATION OF WOUND VAC;  Surgeon: Wylene Simmer, MD;  Location: [place];  Service: Orthopedics;  Laterality: Right;  . BRAIN SURGERY  1980's   Previous brain surgery in [place], [place]  . CESAREAN SECTION  2004; 2006  . COLONOSCOPY N/A 09/17/2015   Procedure: COLONOSCOPY;  Surgeon: Wilford Corner, MD;  Location: [place];  Service: Endoscopy;  Laterality: N/A;  . ESOPHAGOGASTRODUODENOSCOPY N/A 09/17/2015   Procedure: ESOPHAGOGASTRODUODENOSCOPY (EGD);  Surgeon: Charlott Rakes, MD;  Location: [facility] ENDOSCOPY;  Service: Endoscopy;  Laterality: N/A;  . I & D EXTREMITY  11/24/2010   Procedure: IRRIGATION AND DEBRIDEMENT EXTREMITY;  Surgeon: [name], MD;  Location: [facility] OR;  Service: Orthopedics;  Laterality: Left;  Debriedment of left plantar ulcer and trimming of toenails  . I & D EXTREMITY  07/02/2011   Procedure: IRRIGATION AND DEBRIDEMENT EXTREMITY;  Surgeon: [name], MD;  Location: [facility] OR;  Service: Orthopedics;  Laterality: Left;  I & D of Left Foot  . I & D EXTREMITY  07/06/2011   Procedure: IRRIGATION AND DEBRIDEMENT EXTREMITY;  Surgeon: [name], MD;  Location: [facility] OR;  Service: Orthopedics;  Laterality: Right;  IRRIGATION/DEBRIDEMENT RIGHT BELOW KNEE AMPUTATION  . TUBAL LIGATION  2006     Prior to Admission medications   Medication Sig Start Date End Date Taking? Authorizing Provider  acetaminophen (TYLENOL) 500 MG tablet Take 1,000 mg by mouth every 6 (six) hours as needed for mild pain.   Yes [provider]  amLODipine (NORVASC) 10 MG tablet Take 1 tablet (10 mg total) by mouth daily. 11/14/18  Yes Rhetta Mura, MD  aspirin EC 81 MG tablet Take 81 mg by mouth daily as needed (chest pain).   Yes [provider]  furosemide (LASIX) 20 MG tablet Take 1 tablet (20 mg total) by mouth daily. 03/20/19  Yes Melene Plan, DO  gabapentin (NEURONTIN) 400 MG capsule Take 1 capsule (400 mg total) by mouth 3 (three) times daily. Take 400 mg by mouth two times a day, and an additional 400 mg as needed for nerve pain Patient taking differently: Take 400 mg by mouth 3 (three) times daily.  10/16/18  Yes Julieanne Manson, MD  metFORMIN (GLUCOPHAGE) 500 MG tablet Take 1 tablet (500 mg total) by mouth 2 (two) times daily with a meal. Patient taking differently: Take 500 mg by mouth daily with breakfast.  11/13/18 04/11/19 Yes Rhetta Mura, MD    Inpatient Medications: Scheduled Meds: . enoxaparin (LOVENOX) injection  40 mg Subcutaneous Q24H  . furosemide  40 mg Intravenous BID  . gabapentin  400 mg Oral BID  . insulin aspart  0-9 Units Subcutaneous TID WC  . sodium chloride flush  3 mL Intravenous Q12H   Continuous Infusions: . sodium chloride Stopped (04/12/19 0602)  . cefTRIAXone (ROCEPHIN)  IV 1 g (04/12/19 0602)   PRN Meds: sodium chloride, acetaminophen, aspirin EC, gabapentin, HYDROcodone-acetaminophen, ondansetron  (ZOFRAN) IV, sodium chloride flush  Allergies:   No Known Allergies  Social History:   Social History   Socioeconomic History  . Marital status: Widowed    Spouse name: Not on file  . Number of children: Not on file  . Years of education: Not on file  . Highest education level: Not on file  Occupational History  . Not on file  Tobacco Use  . Smoking status: Never Smoker  . Smokeless tobacco: Never Used  Substance and Sexual Activity  . Alcohol use: Yes    Alcohol/week: 2.0 standard drinks    Types: 2 Glasses of wine per week  . Drug use: No  . Sexual activity: Not Currently    Birth control/protection: Post-menopausal  Other Topics Concern  . Not on file  Social History Narrative   Widowed in 2012   Lives in a home she owns   No wheelchair ramp, though garage entry  is flush to both garage and entry floor.   Unable to use full bathroom upstairs.   Has prosthesis for right LE, but never learned how to use.   Cannot see well due to cataracts.   Did not receive disability in 09/2017 with application.   Possibly due to husband's SS she receives   Social Determinants of Corporate investment banker Strain:   . Difficulty of Paying Living Expenses:   Food Insecurity:   . Worried About Programme researcher, broadcasting/film/video in the Last Year:   . Barista in the Last Year:   Transportation Needs:   . Freight forwarder (Medical):   Marland Kitchen Lack of Transportation (Non-Medical):   Physical Activity:   . Days of Exercise per Week:   . Minutes of Exercise per Session:   Stress:   . Feeling of Stress :   Social Connections:   . Frequency of Communication with Friends and Family:   . Frequency of Social Gatherings with Friends and Family:   . Attends Religious Services:   . Active Member of Clubs or Organizations:   . Attends Banker Meetings:   Marland Kitchen Marital Status:   Intimate Partner Violence:   . Fear of Current or Ex-Partner:   . Emotionally Abused:   Marland Kitchen Physically Abused:    . Sexually Abused:     Family History:   Family History  Problem Relation Age of Onset  . Diabetes Mother   . Hypertension Mother   . Hyperlipidemia Mother   . Diabetes Father   . Cancer Father        prostate  . Hyperlipidemia Father   . Hypertension Father   . Diabetes Brother   . Peripheral Artery Disease Brother        has had toes amputated  . Diabetes Son    Family Status:  Family Status  Relation Name Status  . Mother  Alive, age 91y  . Father  Deceased at age 75  . Brother  Alive  . Daughter  Alive, age 97y  . Son  Janalyn Shy, age 39y    ROS:  Please see the history of present illness.  All other ROS reviewed and negative.     Physical Exam/Data:   Vitals:   04/12/19 0508 04/12/19 0512 04/12/19 0715 04/12/19 0933  BP:  137/65 138/71 (!) 149/72  Pulse:  84 84 91  Resp:  Temp:  98.5 F (36.9 C)  98.6 F (37 C)  TempSrc:  Oral  Oral  SpO2:  91% 97% 96%  Weight: 114.2 kg     Height:        Intake/Output Summary (Last 24 hours) at 04/12/2019 1217 Last data filed at 04/12/2019 0900 Gross per 24 hour  Intake 510.26 ml  Output 2400 ml  Net -1889.74 ml   Filed Weights   04/10/19 1608 04/12/19 0508  Weight: 103.5 kg 114.2 kg   Body mass index is 40.64 kg/m.   General: Overweight, pleasant, NAD Skin: Warm, dry, intact  Lungs: Bilateral lower lobe crackles. Breathing is unlabored. Cardiovascular: RRR with S1 S2. No murmurs Abdomen: Soft, non-tender, non-distended. No obvious abdominal masses. Extremities: 2+ BLE edema.  Radial pulses 2+ bilaterally Neuro: Alert and oriented. No focal deficits. No facial asymmetry. MAE spontaneously. Psych: Responds to questions appropriately with normal affect.     EKG:  The EKG was personally reviewed and demonstrates: 04/10/2019 NSR with HR 86 bpm and no significant abnormalities  Telemetry:  Telemetry was personally reviewed and demonstrates: 04/12/2019 NSR  Relevant CV Studies:  ECHO: 04/11/2019:   1. Left  ventricular ejection fraction, by estimation, is 65 to 70%. The  left ventricle has normal function. The left ventricle has no regional  wall motion abnormalities. There is moderate left ventricular hypertrophy.  Left ventricular diastolic function  could not be evaluated.  2. Right ventricular systolic function was not well visualized. The right  ventricular size is not well visualized.  3. The mitral valve is grossly normal. Trivial mitral valve  regurgitation.  4. The aortic valve was not well visualized. Aortic valve regurgitation  is not visualized.    Laboratory Data:  Chemistry Recent Labs  Lab 04/10/19 1610 04/11/19 1203 04/12/19 0517  NA 133*  --  135  K 6.0* 5.2* 4.7  CL 106  --  104  CO2 19*  --  24  GLUCOSE 100*  --  89  BUN 27*  --  24*  CREATININE 1.33*  --  1.39*  CALCIUM 8.4*  --  8.4*  GFRNONAA 46*  --  43*  GFRAA 53*  --  50*  ANIONGAP 8  --  7    Total Protein  Date Value Ref Range Status  04/11/2019 9.7 (H) 6.5 - 8.1 g/dL Final  16/10/960412/11/2018 7.1 6.0 - 8.5 g/dL Final   Albumin  Date Value Ref Range Status  04/11/2019 3.2 (L) 3.5 - 5.0 g/dL Final  54/09/811912/11/2018 4.0 3.8 - 4.9 g/dL Final   AST  Date Value Ref Range Status  04/11/2019 52 (H) 15 - 41 U/L Final  04/03/2019 17 15 - 41 U/L Final   ALT  Date Value Ref Range Status  04/11/2019 8 0 - 44 U/L Final  04/03/2019 12 0 - 44 U/L Final   Alkaline Phosphatase  Date Value Ref Range Status  04/11/2019 62 38 - 126 U/L Final   Total Bilirubin  Date Value Ref Range Status  04/11/2019 0.7 0.3 - 1.2 mg/dL Final  14/78/295603/30/2021 0.4 0.3 - 1.2 mg/dL Final   Hematology Recent Labs  Lab 04/10/19 1610 04/12/19 0517  WBC 3.9* 3.0*  RBC 3.49* 3.37*  HGB 9.4* 9.2*  HCT 32.7* 31.0*  MCV 93.7 92.0  MCH 26.9 27.3  MCHC 28.7* 29.7*  RDW 18.6* 18.6*  PLT 189 208   Cardiac EnzymesNo results for input(s): TROPONINI in the last 168 hours. No results for input(s): TROPIPOC in the last 168 hours.   BNP Recent Labs  Lab 04/11/19 0351  BNP 912.4*    DDimer No results for input(s): DDIMER in the last 168 hours. TSH:  Lab Results  Component Value Date   TSH 3.037 04/11/2019   Lipids: Lab Results  Component Value Date   CHOL 160 11/02/2018   HDL 62 11/02/2018   LDLCALC 82 11/02/2018   TRIG 89 11/02/2018   CHOLHDL 3.1 08/26/2015   HgbA1c: Lab Results  Component Value Date   HGBA1C 5.3 04/11/2019    Radiology/Studies:  DG Chest Portable 1 View  Result Date: 04/11/2019 CLINICAL DATA:  Bilateral leg swelling and pain. EXAM: PORTABLE CHEST 1 VIEW COMPARISON:  03/20/2019 FINDINGS: Cardiomegaly and symmetric basal predominant interstitial coarsening. No visible effusion or consolidation. No pneumothorax. IMPRESSION: Cardiomegaly with vascular congestion/borderline edema. Electronically Signed   By: [name] M.D.   On: 04/11/2019 04:16   ECHOCARDIOGRAM COMPLETE  Result Date: 04/11/2019    ECHOCARDIOGRAM REPORT   Patient Name:   Cheryl HansenMICHELLE L Mclear Date of Exam:  04/11/2019 Medical Rec #:  [id number]         Height:       66.0 in Accession #:    [id number]        Weight:       228.2 lb Date of Birth:  07-25-1965         BSA:          2.115 m Patient Age:    53 years          BP:           151/64 mmHg Patient Gender: F                 HR:           90 bpm. Exam Location:  Inpatient Procedure: 2D Echo, Cardiac Doppler and Color Doppler Indications:    CHF-Acute Diastolic 428.31/I50.31  History:        Patient has prior history of Echocardiogram examinations, most                 recent January [birth year], 1965. CHF; Risk Factors:Diabetes and Hypertension.                 CKD.  Sonographer:    Cheryl Ludwig RDCS (AE) Referring Phys: [id number] [facility] A SMITH  Sonographer Comments: Image acquisition challenging due to uncooperative patient. Patient refused remainder of test after finishing PLAX and SAX images, stating that she "didn't know it was going to be all this." She also did not want to remover her bra or  get gel on her bra. IMPRESSIONS  1. Left ventricular ejection fraction, by estimation, is 65 to 70%. The left ventricle has normal function. The left ventricle has no regional wall motion abnormalities. There is moderate left ventricular hypertrophy. Left ventricular diastolic function  could not be evaluated.  2. Right ventricular systolic function was not well visualized. The right ventricular size is not well visualized.  3. The mitral valve is grossly normal. Trivial mitral valve regurgitation.  4. The aortic valve was not well visualized. Aortic valve regurgitation is not visualized. FINDINGS  Left Ventricle: Left ventricular ejection fraction, by estimation, is 65 to 70%. The left ventricle has normal function. The left ventricle has no regional wall motion abnormalities. The left ventricular internal cavity size was normal in size. There is  moderate left ventricular hypertrophy. Left ventricular diastolic function could not be evaluated. Right Ventricle: The right ventricular size is not well visualized. Right vetricular wall thickness was not assessed. Right ventricular systolic function was not well visualized. Left Atrium: Left atrial size was not well visualized. Right Atrium: Right atrial size was not well visualized. Pericardium: There is no evidence of pericardial effusion. Mitral Valve: The mitral valve is grossly normal. Trivial mitral valve regurgitation. Tricuspid Valve: The tricuspid valve is grossly normal. Tricuspid valve regurgitation is mild. Aortic Valve: The aortic valve was not well visualized. Aortic valve regurgitation is not visualized. Pulmonic Valve: The pulmonic valve was normal in structure. Pulmonic valve regurgitation is not visualized. Aorta: The aortic root is normal in size and structure. IAS/Shunts: The interatrial septum was not well visualized.  LEFT VENTRICLE PLAX 2D LVIDd:         5.60 cm LVIDs:         3.10 cm LV PW:         1.30 cm LV IVS:        1.20 cm LVOT diam:      2.20 cm LVOT Area:  3.80 cm  LEFT ATRIUM         Index LA diam:    3.00 cm 1.42 cm/m   AORTA Ao Root diam: 3.00 cm TRICUSPID VALVE TR Peak grad:   28.9 mmHg TR Vmax:        269.00 cm/s  SHUNTS Systemic Diam: 2.20 cm Cheryl Shutter MD Electronically signed by Cheryl Shutter MD Signature Date/Time: 04/11/2019/4:16:37 PM    Final    VAS [place] LOWER EXTREMITY VENOUS (DVT)  Result Date: 04/11/2019  Lower Venous DVTStudy Indications: Swelling, and Pain.  Limitations: Poor ultrasound/tissue interface, body habitus and rt bka. Comparison Study: 01/26/18 previous Performing Technologist: Blanch Media RVS  Examination Guidelines: A complete evaluation includes B-mode imaging, spectral Doppler, color Doppler, and power Doppler as needed of all accessible portions of each vessel. Bilateral testing is considered an integral part of a complete examination. Limited examinations for reoccurring indications may be performed as noted. The reflux portion of the exam is performed with the patient in reverse Trendelenburg.  +---------+---------------+---------+-----------+----------+--------------+ RIGHT    CompressibilityPhasicitySpontaneityPropertiesThrombus Aging +---------+---------------+---------+-----------+----------+--------------+ CFV      Full           Yes      Yes                                 +---------+---------------+---------+-----------+----------+--------------+ SFJ      Full                                                        +---------+---------------+---------+-----------+----------+--------------+ FV Prox  Full                                                        +---------+---------------+---------+-----------+----------+--------------+ FV Mid                  Yes      Yes                                 +---------+---------------+---------+-----------+----------+--------------+ FV Distal                                             Not visualized  +---------+---------------+---------+-----------+----------+--------------+ PFV      Full                                                        +---------+---------------+---------+-----------+----------+--------------+ POP      Full           Yes      Yes                                 +---------+---------------+---------+-----------+----------+--------------+ PTV  bka            +---------+---------------+---------+-----------+----------+--------------+ PERO                                                  bka            +---------+---------------+---------+-----------+----------+--------------+   +---------+---------------+---------+-----------+----------+--------------+ LEFT     CompressibilityPhasicitySpontaneityPropertiesThrombus Aging +---------+---------------+---------+-----------+----------+--------------+ CFV      Full           Yes      Yes                                 +---------+---------------+---------+-----------+----------+--------------+ SFJ      Full                                                        +---------+---------------+---------+-----------+----------+--------------+ FV Prox  Full                                                        +---------+---------------+---------+-----------+----------+--------------+ FV Mid                  Yes      Yes                                 +---------+---------------+---------+-----------+----------+--------------+ FV Distal               Yes      Yes                                 +---------+---------------+---------+-----------+----------+--------------+ PFV      Full                                                        +---------+---------------+---------+-----------+----------+--------------+ POP      Full           Yes      Yes                                  +---------+---------------+---------+-----------+----------+--------------+ PTV      Full                                                        +---------+---------------+---------+-----------+----------+--------------+     Summary: RIGHT: - There is no evidence of deep vein thrombosis in the lower extremity. However, portions of this examination were limited- see technologist comments above.  - No cystic  structure found in the popliteal fossa.  LEFT: - There is no evidence of deep vein thrombosis in the lower extremity.  - No cystic structure found in the popliteal fossa.  *See table(s) above for measurements and observations. Electronically signed by Cheryl Livings MD on 04/11/2019 at 5:12:48 PM.    Final     Assessment and Plan:   1.  Acute presumed diastolic CHF exacerbation: -Patient presented with a 3-week history of lower extremity edema and DOE symptoms found to be significantly fluid volume overloaded on exam with an elevated BNP at 912 and subsequent CXR with pulmonary edema -Given IV Lasix 40 mg in the ED with good response therefore continued on IV Lasix 40 mg twice daily -Recently seen in the ED on 03/20/2019 for similar symptoms at which time she was treated with 80 mg IV Lasix x1 and discharged home on p.o. Lasix 20 mg daily for short course.  Patient reports initial response with IV Lasix however subsequent symptom return with p.o. dosing. -Continue with daily weights, strict I&O -Weight, 251lb -I&O, net -2.7 L since hospital admission -Would continue with IV Lasix 40 mg twice daily given adequate response -Once fluid volume status stabilized, transition to p.o. dosing  2.  Hyperkalemia: -On ED presentation, K+ found to be >6.0.  She was treated with IV Lasix at which time potassium levels have trended down -K+ today, 4.7 -Continue to monitor with daily BMET  3.  CKD stage III: -Creatinine, 1.33 -Baseline appears to be in the 1.3-1.6 range -Trend closely with diuresis  4.   DM2: -SSI for glucose control inpatient status per primary team -On PTA Metformin>> currently on hold   5.  Essential hypertension: -Stable, 149/72, 138/71, 137/65 -PTA amlodipine currently being held   Other hospital problems per primary team include: -UTI with leukocytosis -History of ITP -Cataracts -Status post BKA   For questions or updates, please contact [facility] Please consult [email or web address] for contact info under Cardiology/STEMI.   Cheryl Ip NP-C [facility] Pager: [phone] 04/12/2019 12:17 PM  Patient seen and examined with Georgie Chard NP-C.  Agree as above, with the following exceptions and changes as noted below. Newly diagnosed heart failure with preserved ejection fraction. Patient did not let sonographer finish study on either occasion, we do not have complete echo data. She and I discussed that an assessment of valvular heart disease and diastolic filling pattern will be helpful to further manage her symptoms going forward. She understands and agrees. Gen: NAD, CV: RRR, no murmurs, Lungs: bibasilar crackles, Abd: soft, Extrem: R BKA, well healed. Healed excoriations on both legs. 2+ bilateral edema Neuro/Psych: alert and oriented x 3, normal mood and affect. All available labs, radiology testing, previous records reviewed. We discussed continued diuresis, and day to day assessment of volume status. K has improved.  - will restart amlodipine 10 mg daily for HTN, BP in 160/80 range.   Parke Poisson, MD 04/12/19 6:13 PM

## 2019-04-12 NOTE — Progress Notes (Signed)
Patient stated she wanted to restart her daily BP medicine of amlodipine; the patient BP is 165/86 taken at 1600. MD paged and notified. MD reinstated her amlodipine.

## 2019-04-13 ENCOUNTER — Inpatient Hospital Stay: Payer: Medicare Other

## 2019-04-13 ENCOUNTER — Inpatient Hospital Stay: Payer: Medicare Other | Admitting: Hematology and Oncology

## 2019-04-13 DIAGNOSIS — I509 Heart failure, unspecified: Secondary | ICD-10-CM | POA: Diagnosis not present

## 2019-04-13 DIAGNOSIS — I5033 Acute on chronic diastolic (congestive) heart failure: Secondary | ICD-10-CM | POA: Diagnosis not present

## 2019-04-13 DIAGNOSIS — N1831 Chronic kidney disease, stage 3a: Secondary | ICD-10-CM | POA: Diagnosis not present

## 2019-04-13 LAB — URINE CULTURE: Culture: >=100000 — AB

## 2019-04-13 LAB — GLUCOSE, CAPILLARY
Glucose-Capillary: 93 mg/dL (ref 70–99)
Glucose-Capillary: 91 mg/dL (ref 70–99)
Glucose-Capillary: 115 mg/dL — ABNORMAL HIGH (ref 70–99)
Glucose-Capillary: 129 mg/dL — ABNORMAL HIGH (ref 70–99)

## 2019-04-13 LAB — BASIC METABOLIC PANEL
Anion gap: 10 (ref 5–15)
BUN: 27 mg/dL — ABNORMAL HIGH (ref 6–20)
CO2: 25 mmol/L (ref 22–32)
Calcium: 8.8 mg/dL — ABNORMAL LOW (ref 8.9–10.3)
Chloride: 100 mmol/L (ref 98–111)
Creatinine, Ser: 1.45 mg/dL — ABNORMAL HIGH (ref 0.44–1.00)
GFR calc Af Amer: 48 mL/min — ABNORMAL LOW (ref >60)
GFR calc non Af Amer: 41 mL/min — ABNORMAL LOW (ref >60)
Glucose, Bld: 95 mg/dL (ref 70–99)
Potassium: 4.5 mmol/L (ref 3.5–5.1)
Sodium: 135 mmol/L (ref 135–145)

## 2019-04-13 LAB — HEMOGLOBIN A1C
Hgb A1c MFr Bld: 5.5 % (ref 4.8–5.6)
Mean Plasma Glucose: 111 mg/dL

## 2019-04-13 MED ORDER — CEPHALEXIN 250 MG PO CAPS
250.0000 mg | ORAL_CAPSULE | Freq: Three times a day (TID) | ORAL | Status: DC
  Administered 2019-04-13 – 2019-04-17 (×12): 250 mg via ORAL
  Filled 2019-04-13 (×12): qty 1

## 2019-04-13 NOTE — Evaluation (Addendum)
Occupational Therapy Evaluation Patient Details Name: [name] MRN: [id number] DOB: [birth year] Today's Date: 04/13/2019    History of Present Illness 54 yo admitted with bil LE edema and CHF. PMHx: Rt BKA, Dm, HTN, neuropathy   Clinical Impression   PTA pt living alone, reports being independent for BADL using w/c and transferring to and from without assist. At time of eval, pt completed bed mobility at min guard level of assist and sit <> stands at mod A with RW. Mod A needed to stabilize functional mobility by hopping to [facility]. Pt requiring assist for peri care due to needing UE support to maintain static standing. Cognitive deficits also noted in problem solving, safety, STM, and awareness impacting BADL safety with living alone. Given her current status with diffiuclty transferring, recommend SNF but pt states she will refuse. Recommend HHOT at d/c with aide support. Will continue to follow per POC listed below.    Follow Up Recommendations  Home health OT;Supervision/Assistance - 24 hour;Other (comment)(discussed SNF, pt stated she would refuse)    Equipment Recommendations  None recommended by OT    Recommendations for Other Services       Precautions / Restrictions Precautions Precautions: Fall Precaution Comments: pt with very low vision and reports progressive decline in vision for 2 years. Has a prosthesis but never used it Restrictions Weight Bearing Restrictions: No      Mobility Bed Mobility Overal bed mobility: Needs Assistance       Supine to sit: Modified independent (Device/Increase time);HOB elevated        Transfers Overall transfer level: Needs assistance Equipment used: Rolling walker (2 wheeled) Transfers: Sit to/from Stand Sit to Stand: Mod assist   Squat pivot transfers: Mod assist     General transfer comment: mod A to rise and steady in RW; increased time and cues to progress to hopping to [facility]. Heavy reliance on BUE support due to  overall weakness    Balance Overall balance assessment: Needs assistance   Sitting balance-Leahy Scale: Fair     Standing balance support: Bilateral upper extremity supported Standing balance-Leahy Scale: Poor Standing balance comment: reliant on external support                           ADL either performed or assessed with clinical judgement   ADL Overall ADL's : Needs assistance/impaired Eating/Feeding: Set up;Sitting   Grooming: Set up;Sitting   Upper Body Bathing: Set up;Sitting   Lower Body Bathing: Sitting/lateral leans;Minimal assistance Lower Body Bathing Details (indicate cue type and reason): needs to remain in seated position, difficulty reaching to peri area and top of legs in standing on posterior side Upper Body Dressing : Set up;Sitting   Lower Body Dressing: Sit to/from stand;Sitting/lateral leans;Moderate assistance Lower Body Dressing Details (indicate cue type and reason): pt is able to complete figure 4 method to don sock, but needs both UEs to support her in standing to pull up over her hips Toilet Transfer: Moderate assistance;Stand-pivot;RW;BSC Toilet Transfer Details (indicate cue type and reason): mod A hopping transfer with RW. Increased VC's and time to process instruction Toileting- Clothing Manipulation and Hygiene: Minimal assistance;Sit to/from Nurse, children's Details (indicate cue type and reason): spone bathes at baseline Functional mobility during ADLs: Moderate assistance;Rolling walker;Cueing for safety;Cueing for sequencing(stand pivot)       Vision Baseline Vision/History: Cataracts Patient Visual Report: No change from baseline Additional Comments: pt with baseline low vision.  Needs extreme contrast and bright light to make out objects. Otherwise colors look white     Perception     Praxis      Pertinent Vitals/Pain Pain Assessment: 0-10 Pain Score: 4  Pain Location: Bil LLEs Pain Descriptors /  Indicators: Aching;Sore Pain Intervention(s): Limited activity within patient's tolerance;Repositioned     Hand Dominance     Extremity/Trunk Assessment Upper Extremity Assessment Upper Extremity Assessment: Generalized weakness   Lower Extremity Assessment Lower Extremity Assessment: Generalized weakness       Communication     Cognition Arousal/Alertness: Awake/alert Behavior During Therapy: WFL for tasks assessed/performed Overall Cognitive Status: No family/caregiver present to determine baseline cognitive functioning Area of Impairment: Memory;Safety/judgement;Problem solving                     Memory: Decreased short-term memory   Safety/Judgement: Decreased awareness of safety;Decreased awareness of deficits   Problem Solving: Slow processing;Difficulty sequencing;Requires verbal cues;Requires tactile cues General Comments: increased time and cueing needed for basic tasks. diverts to humor when having difficulty processing. Poor awareness into deficits and how this may impact home safety   General Comments       Exercises     Shoulder Instructions      Home Living Family/patient expects to be discharged to:: Private residence Living Arrangements: Alone Available Help at Discharge: Family;Available PRN/intermittently Type of Home: House Home Access: Stairs to enter Entrance Stairs-Number of Steps: 1   Home Layout: Two level;1/2 bath on main level         Bathroom Toilet: Standard     Home Equipment: Walker - 2 wheels;Bedside commode;Wheelchair - manual;Hospital bed   Additional Comments: family or friends will bump her up the step      Prior Functioning/Environment Level of Independence: Needs assistance  Gait / Transfers Assistance Needed: reports progressive decline to be able to stand and hop with RW ADL's / Homemaking Assistance Needed: sponge bathes as no tub/shower downstairs, easy prep meals, family does the shopping             OT Problem List: Decreased strength;Decreased knowledge of use of DME or AE;Decreased activity tolerance;Cardiopulmonary status limiting activity;Impaired balance (sitting and/or standing);Decreased safety awareness;Decreased cognition      OT Treatment/Interventions: Self-care/ADL training;Therapeutic exercise;Patient/family education;Balance training;Energy conservation;Therapeutic activities;DME and/or AE instruction    OT Goals(Current goals can be found in the care plan section) Acute Rehab OT Goals Patient Stated Goal: return to my house OT Goal Formulation: With patient Time For Goal Achievement: 04/27/19 Potential to Achieve Goals: Good  OT Frequency: Min 2X/week   Barriers to D/C:            Co-evaluation              AM-PAC OT "6 Clicks" Daily Activity     Outcome Measure Help from another person eating meals?: None Help from another person taking care of personal grooming?: None Help from another person toileting, which includes using toliet, bedpan, or urinal?: A Lot Help from another person bathing (including washing, rinsing, drying)?: A Lot   Help from another person to put on and taking off regular lower body clothing?: A Lot 6 Click Score: 14   End of Session Equipment Utilized During Treatment: Rolling walker;Gait belt Nurse Communication: Mobility status  Activity Tolerance: Patient tolerated treatment well Patient left: in bed;with call bell/phone within reach  OT Visit Diagnosis: Unsteadiness on feet (R26.81);Other abnormalities of gait and mobility (R26.89);Muscle weakness (generalized) (M62.81)  Time: 1610-9604 OT Time Calculation (min): 47 min Charges:  OT General Charges $OT Visit: 1 Visit OT Evaluation $OT Eval Moderate Complexity: 1 Mod OT Treatments $Self Care/Home Management : 23-37 mins  Dalphine Handing, MSOT, OTR/L Acute Rehabilitation Services [facility] Office Number: [phone] Pager: [phone]  Dalphine Handing 04/13/2019, 2:08 PM

## 2019-04-13 NOTE — Progress Notes (Addendum)
Progress Note  Patient Name: [name] Date of Encounter: 04/13/2019  Primary Cardiologist: New   Subjective   Feeling well today.  Being seen for PT.  Denies chest pain, shortness of breath.  Inpatient Medications    Scheduled Meds: . amLODipine  10 mg Oral Daily  . enoxaparin (LOVENOX) injection  40 mg Subcutaneous Q24H  . furosemide  40 mg Intravenous BID  . gabapentin  400 mg Oral BID  . insulin aspart  0-9 Units Subcutaneous TID WC  . sodium chloride flush  3 mL Intravenous Q12H   Continuous Infusions: . sodium chloride 250 mL (04/13/19 0631)  . cefTRIAXone (ROCEPHIN)  IV 1 g (04/13/19 5784)   PRN Meds: sodium chloride, acetaminophen, aspirin EC, gabapentin, HYDROcodone-acetaminophen, ondansetron (ZOFRAN) IV, sodium chloride flush   Vital Signs    Vitals:   04/12/19 1957 04/13/19 0110 04/13/19 0403 04/13/19 0816  BP:  (!) 162/78 (!) 146/83 (!) 149/68  Pulse:  88 80 82  Resp:  19 16 17   Temp:  98.2 F (36.8 C) 98.3 F (36.8 C) 97.7 F (36.5 C)  TempSrc:  Oral Oral Oral  SpO2: 96% 96% 90% 96%  Weight:  111.9 kg    Height:        Intake/Output Summary (Last 24 hours) at 04/13/2019 0908 Last data filed at 04/13/2019 0818 Gross per 24 hour  Intake 1187.4 ml  Output 3800 ml  Net -2612.6 ml   Filed Weights   04/10/19 1608 04/12/19 0508 04/13/19 0110  Weight: 103.5 kg 114.2 kg 111.9 kg    Physical Exam   General: Obese, NAD Neck: Negative for carotid bruits. No JVD Lungs:Clear to ausculation bilaterally. No wheezes, rales, or rhonchi. Breathing is unlabored. Cardiovascular: RRR with S1 S2. No murmur Abdomen: Soft, non-tender, non-distended. No obvious abdominal masses. Extremities: 1-2+ LLE. R BKA Neuro: Alert and oriented. No focal deficits. No facial asymmetry. MAE spontaneously. Psych: Responds to questions appropriately with normal affect.    Labs    Chemistry Recent Labs  Lab 04/10/19 1610 04/10/19 1610 04/11/19 0351  04/11/19 1203 04/12/19 0517 04/13/19 0437  NA 133*  --   --   --  135 135  K 6.0*   < >  --  5.2* 4.7 4.5  CL 106  --   --   --  104 100  CO2 19*  --   --   --  24 25  GLUCOSE 100*  --   --   --  89 95  BUN 27*  --   --   --  24* 27*  CREATININE 1.33*  --   --   --  1.39* 1.45*  CALCIUM 8.4*  --   --   --  8.4* 8.8*  PROT  --   --  9.7*  --   --   --   ALBUMIN  --   --  3.2*  --   --   --   AST  --   --  52*  --   --   --   ALT  --   --  8  --   --   --   ALKPHOS  --   --  62  --   --   --   BILITOT  --   --  0.7  --   --   --   GFRNONAA 46*  --   --   --  43* 41*  GFRAA 53*  --   --   --  50* 48*  ANIONGAP 8  --   --   --  7 10   < > = values in this interval not displayed.     Hematology Recent Labs  Lab 04/10/19 1610 04/12/19 0517  WBC 3.9* 3.0*  RBC 3.49* 3.37*  HGB 9.4* 9.2*  HCT 32.7* 31.0*  MCV 93.7 92.0  MCH 26.9 27.3  MCHC 28.7* 29.7*  RDW 18.6* 18.6*  PLT 189 208    Cardiac EnzymesNo results for input(s): TROPONINI in the last 168 hours. No results for input(s): TROPIPOC in the last 168 hours.   BNP Recent Labs  Lab 04/11/19 0351  BNP 912.4*     DDimer No results for input(s): DDIMER in the last 168 hours.   Radiology    ECHOCARDIOGRAM COMPLETE  Result Date: 04/11/2019    ECHOCARDIOGRAM REPORT   Patient Name:   [name] Date of Exam: 04/11/2019 Medical Rec #:  [id number]         Height:       66.0 in Accession #:    [id number]        Weight:       228.2 lb Date of Birth:  [birth year]         BSA:          2.115 m Patient Age:    54 years          BP:           151/64 mmHg Patient Gender: F                 HR:           90 bpm. Exam Location:  Inpatient Procedure: 2D Echo, Cardiac Doppler and Color Doppler Indications:    CHF-Acute Diastolic 428.31/I50.31  History:        Patient has prior history of Echocardiogram examinations, most                 recent June 27, 1966. CHF; Risk Factors:Diabetes and Hypertension.                 CKD.  Sonographer:     Ross Ludwig RDCS (AE) Referring Phys: [id number] [facility] A SMITH  Sonographer Comments: Image acquisition challenging due to uncooperative patient. Patient refused remainder of test after finishing PLAX and SAX images, stating that she "didn't know it was going to be all this." She also did not want to remover her bra or get gel on her bra. IMPRESSIONS  1. Left ventricular ejection fraction, by estimation, is 65 to 70%. The left ventricle has normal function. The left ventricle has no regional wall motion abnormalities. There is moderate left ventricular hypertrophy. Left ventricular diastolic function  could not be evaluated.  2. Right ventricular systolic function was not well visualized. The right ventricular size is not well visualized.  3. The mitral valve is grossly normal. Trivial mitral valve regurgitation.  4. The aortic valve was not well visualized. Aortic valve regurgitation is not visualized. FINDINGS  Left Ventricle: Left ventricular ejection fraction, by estimation, is 65 to 70%. The left ventricle has normal function. The left ventricle has no regional wall motion abnormalities. The left ventricular internal cavity size was normal in size. There is  moderate left ventricular hypertrophy. Left ventricular diastolic function could not be evaluated. Right Ventricle: The right ventricular size is not well visualized. Right vetricular wall thickness was not assessed. Right ventricular systolic function was not well visualized. Left Atrium: Left atrial size was  not well visualized. Right Atrium: Right atrial size was not well visualized. Pericardium: There is no evidence of pericardial effusion. Mitral Valve: The mitral valve is grossly normal. Trivial mitral valve regurgitation. Tricuspid Valve: The tricuspid valve is grossly normal. Tricuspid valve regurgitation is mild. Aortic Valve: The aortic valve was not well visualized. Aortic valve regurgitation is not visualized. Pulmonic Valve: The pulmonic valve  was normal in structure. Pulmonic valve regurgitation is not visualized. Aorta: The aortic root is normal in size and structure. IAS/Shunts: The interatrial septum was not well visualized.  LEFT VENTRICLE PLAX 2D LVIDd:         5.60 cm LVIDs:         3.10 cm LV PW:         1.30 cm LV IVS:        1.20 cm LVOT diam:     2.20 cm LVOT Area:     3.80 cm  LEFT ATRIUM         Index LA diam:    3.00 cm 1.42 cm/m   AORTA Ao Root diam: 3.00 cm TRICUSPID VALVE TR Peak grad:   28.9 mmHg TR Vmax:        269.00 cm/s  SHUNTS Systemic Diam: 2.20 cm Lyman Bishop MD Electronically signed by Lyman Bishop MD Signature Date/Time: 04/11/2019/4:16:37 PM    Final    VAS [place] LOWER EXTREMITY VENOUS (DVT)  Result Date: 04/11/2019  Lower Venous DVTStudy Indications: Swelling, and Pain.  Limitations: Poor ultrasound/tissue interface, body habitus and rt bka. Comparison Study: 01/26/18 previous Performing Technologist: Abram Sander RVS  Examination Guidelines: A complete evaluation includes B-mode imaging, spectral Doppler, color Doppler, and power Doppler as needed of all accessible portions of each vessel. Bilateral testing is considered an integral part of a complete examination. Limited examinations for reoccurring indications may be performed as noted. The reflux portion of the exam is performed with the patient in reverse Trendelenburg.  +---------+---------------+---------+-----------+----------+--------------+ RIGHT    CompressibilityPhasicitySpontaneityPropertiesThrombus Aging +---------+---------------+---------+-----------+----------+--------------+ CFV      Full           Yes      Yes                                 +---------+---------------+---------+-----------+----------+--------------+ SFJ      Full                                                        +---------+---------------+---------+-----------+----------+--------------+ FV Prox  Full                                                         +---------+---------------+---------+-----------+----------+--------------+ FV Mid                  Yes      Yes                                 +---------+---------------+---------+-----------+----------+--------------+ FV Distal  Not visualized +---------+---------------+---------+-----------+----------+--------------+ PFV      Full                                                        +---------+---------------+---------+-----------+----------+--------------+ POP      Full           Yes      Yes                                 +---------+---------------+---------+-----------+----------+--------------+ PTV                                                   bka            +---------+---------------+---------+-----------+----------+--------------+ PERO                                                  bka            +---------+---------------+---------+-----------+----------+--------------+   +---------+---------------+---------+-----------+----------+--------------+ LEFT     CompressibilityPhasicitySpontaneityPropertiesThrombus Aging +---------+---------------+---------+-----------+----------+--------------+ CFV      Full           Yes      Yes                                 +---------+---------------+---------+-----------+----------+--------------+ SFJ      Full                                                        +---------+---------------+---------+-----------+----------+--------------+ FV Prox  Full                                                        +---------+---------------+---------+-----------+----------+--------------+ FV Mid                  Yes      Yes                                 +---------+---------------+---------+-----------+----------+--------------+ FV Distal               Yes      Yes                                  +---------+---------------+---------+-----------+----------+--------------+ PFV      Full                                                        +---------+---------------+---------+-----------+----------+--------------+  POP      Full           Yes      Yes                                 +---------+---------------+---------+-----------+----------+--------------+ PTV      Full                                                        +---------+---------------+---------+-----------+----------+--------------+     Summary: RIGHT: - There is no evidence of deep vein thrombosis in the lower extremity. However, portions of this examination were limited- see technologist comments above.  - No cystic structure found in the popliteal fossa.  LEFT: - There is no evidence of deep vein thrombosis in the lower extremity.  - No cystic structure found in the popliteal fossa.  *See table(s) above for measurements and observations. Electronically signed by Lemar Livings MD on 04/11/2019 at 5:12:48 PM.    Final    Telemetry    NSR - Personally Reviewed  ECG    No new tracing as of 04/13/2019- Personally Reviewed  Cardiac Studies   ECHO: 04/11/2019:   1. Left ventricular ejection fraction, by estimation, is 65 to 70%. The  left ventricle has normal function. The left ventricle has no regional  wall motion abnormalities. There is moderate left ventricular hypertrophy.  Left ventricular diastolic function  could not be evaluated.  2. Right ventricular systolic function was not well visualized. The right  ventricular size is not well visualized.  3. The mitral valve is grossly normal. Trivial mitral valve  regurgitation.  4. The aortic valve was not well visualized. Aortic valve regurgitation  is not visualized.   Patient Profile     54 y.o. female with a hx of hypertension, DM type II, anemia,ITP and s/pright BKA who is being seen today for the evaluation of CHF at the request of Dr.  Katrinka Blazing.  Assessment & Plan    1.  Acute presumed diastolic CHF exacerbation: -Patient presented with a 3-week history of lower extremity edema and DOE symptoms found to be significantly volume overloaded on exam with an elevated BNP at 912 and subsequent CXR with pulmonary edema -Given IV Lasix 40 mg in the ED with good response therefore continued on IV Lasix 40 mg twice daily -Recently seen in the ED on 03/20/2019 for similar symptoms at which time she was treated with 80 mg IV Lasix x1 and discharged home on p.o. Lasix 20 mg daily for short course.  Patient reports initial response with IV Lasix however subsequent symptom return with p.o. dosing. -Continue with daily weights, strict I&O -Weight, 246lb -I&O, net -5.7 L since hospital admission -Would continue with IV Lasix 40 mg twice daily given adequate response>> may reduce to IV Lasix 40 mg daily given rising creatinine -Once volume status stabilized, transition to p.o. dosing  2.  Hyperkalemia: -On ED presentation, K+ found to be >6.0.  She was treated with IV Lasix at which time potassium levels have trended down -K+ today, 4.5 -Continue to monitor with daily BMET  3.  CKD stage III: -Creatinine, 1.45 from 1.33 yesterday. May be close to baseline dry weight -Baseline appears to be in the 1.3-1.6 range -Trend closely with  diuresis  4.  DM2: -SSI for glucose control inpatient status per primary team -On PTA Metformin>> currently on hold  5.  Essential hypertension: - hypertensive, 149/68, 146/83, 162/78 -Restarted PTA amlodipine   Other hospital problems per primary team include: -UTI with leukocytosis -History of ITP -Cataracts -Status post BKA   Signed, Georgie Chard NP-C [facility] Pager: [phone] 04/13/2019, 9:08 AM     For questions or updates, please contact   Please consult [email or web address] for contact info under Cardiology/STEMI.  Patient seen and examined with JMcDaniel NPC.  Agree as above, with  the following exceptions and changes as noted below. Feels that her taut LE edema is improving, can flex R BKA better today but still has upper thigh discomfort. Gen: NAD, CV: RRR, no murmurs, Lungs: clear, Abd: soft, Extrem: Warm,R BKA, 1 +edema bl, Neuro/Psych: alert and oriented x 3, normal mood and affect. All available labs, radiology testing, previous records reviewed.  I/O:  UOP: 3.4 L Weight: 251>246lb Infusions: n/a Net negative for admission: -7.2 L  Continue IV lasix 40 mg BID. Watch rena function.  Parke Poisson, MD

## 2019-04-13 NOTE — TOC Initial Note (Signed)
Transition of Care [facility]) - Initial/Assessment Note    Patient Details  Name: [name] MRN: [id number] Date of Birth: 1965/12/14  Transition of Care [facility]) CM/SW Contact:    Zenon Mayo, RN Phone Number: 04/13/2019, 3:11 PM  Clinical Narrative:                 NCM spoke with patient , offered choice she chose [facility], NCM made referral to [facility] with [facility] for [facility] for CHF.  She was able to take referral .  Soc will begin 24 to 48 hrs post dc.  Patient states she has a walker at home and her w/chair is in the room.  , she will need a PCP. NCM made follow up apt with Patient [place] for 4/19 at 8:20 with [facility].  Expected Discharge Plan: [place] Barriers to Discharge: Continued Medical Work up   Patient Goals and CMS Choice Patient states their goals for this hospitalization and ongoing recovery are:: to walk again CMS Medicare.gov Compare Post Acute Care list provided to:: Patient Choice offered to / list presented to : Patient  Expected Discharge Plan and Services Expected Discharge Plan: [place] In-house Referral: NA Discharge Planning Services: CM Consult Post Acute Care Choice: [place] arrangements for the past 2 months: Single Family Home                   DME Agency: NA       HH Arranged: RN          Prior Living Arrangements/Services Living arrangements for the past 2 months: Single Family Home Lives with:: Self Patient language and need for interpreter reviewed:: Yes Do you feel safe going back to the place where you live?: Yes      Need for Family Participation in Patient Care: No (Comment) Care giver support system in place?: No (comment) Current home services: DME(walker at home and w/chair) Criminal Activity/Legal Involvement Pertinent to Current Situation/Hospitalization: No - Comment as needed  Activities of Daily Living Home Assistive Devices/Equipment: Environmental consultant (specify type),  Wheelchair ADL Screening (condition at time of admission) Patient's cognitive ability adequate to safely complete daily activities?: Yes Is the patient deaf or have difficulty hearing?: No Does the patient have difficulty seeing, even when wearing glasses/contacts?: No Does the patient have difficulty concentrating, remembering, or making decisions?: No Patient able to express need for assistance with ADLs?: Yes Does the patient have difficulty dressing or bathing?: No Independently performs ADLs?: Yes (appropriate for developmental age) Does the patient have difficulty walking or climbing stairs?: Yes Weakness of Legs: Left(right bka) Weakness of Arms/Hands: None  Permission Sought/Granted                  Emotional Assessment Appearance:: Appears stated age Attitude/Demeanor/Rapport: Engaged Affect (typically observed): Appropriate Orientation: : Oriented to Self, Oriented to Place, Oriented to  Time, Oriented to Situation Alcohol / Substance Use: Not Applicable Psych Involvement: No (comment)  Admission diagnosis:  CHF (congestive heart failure) ([place]) [I50.9] Peripheral edema [R60.9] Acute exacerbation of CHF (congestive heart failure) ([place]) [I50.9] Acute cystitis without hematuria [N30.00] Patient Active Problem List   Diagnosis Date Noted  . CHF (congestive heart failure) ([place]) 04/11/2019  . Acute exacerbation of CHF (congestive heart failure) ([place]) 04/11/2019  . Leukopenia 04/11/2019  . History of ITP 04/11/2019  . Acute ITP ([place]) 11/16/2018  . Onychomycosis of toenail 10/16/2018  . Cataract of both eyes 10/16/2018  .  Tinea pedis of left foot 10/16/2018  . Decreased vision in both eyes 10/16/2018  . CKD (chronic kidney disease) stage 3, GFR 30-59 ml/min   . Cellulitis of left lower extremity 01/25/2018  . Hyperkalemia 01/25/2018  . Poor compliance 03/08/2017  . Hypoxia 03/07/2017  . Thyroid nodule 03/07/2017  . Chronic indwelling Foley catheter 03/04/2017  .  Type II diabetes mellitus (HCC)   . Femur fracture (HCC) 03/02/2017  . Orthostatic dizziness   . Urinary tract infection without hematuria   . Enterococcus faecalis infection   . Near syncope 11/25/2015  . Dehydration 11/25/2015  . Orthostatic hypotension 11/25/2015  . UTI (urinary tract infection) 11/25/2015  . Acute worsening of stage 3 chronic kidney disease 09/24/2015  . Diabetes mellitus due to underlying condition with chronic kidney disease, without long-term current use of insulin (HCC)   . Fever   . Status post below knee amputation of right lower extremity (HCC)   . Diabetic peripheral neuropathy (HCC)   . Neuropathic pain   . Benign essential HTN   . Folliculitis   . Slow transit constipation   . Anemia of chronic disease   . Bilateral hydronephrosis   . Urinary retention   . CKD (chronic kidney disease)   . Uncontrolled type 2 diabetes mellitus with diabetic nephropathy, without long-term current use of insulin (HCC)   . Vasculopathy   . Debility 09/04/2015  . DM type 2 with diabetic peripheral neuropathy (HCC)   . S/P BKA (below knee amputation) unilateral (HCC)   . Medical non-compliance   . Benign skin lesion of multiple sites   . Tachypnea   . Acute blood loss anemia   . Neurogenic bladder   . Occult blood positive stool   . Rectovaginal fistula   . Controlled diabetes mellitus type 2 with complications (HCC)   . Acute on chronic renal failure (HCC)   . Hydronephrosis determined by ultrasound   . Papular rash   . Uncontrolled type 2 diabetes mellitus with complication (HCC)   . Paresthesia   . Thrombocytopenia (HCC) 08/23/2015  . Pressure ulcer 08/23/2015  . Severe anemia 08/23/2015  . Lower urinary tract infectious disease 08/23/2015  . Absolute anemia 08/23/2015  . Peripheral vascular disease, unspecified (HCC) 04/27/2012  . Unilateral complete BKA (HCC) 07/09/2011  . Gas gangrene (HCC) 07/02/2011  . Sepsis(995.91) 07/02/2011  . Acute renal failure  (HCC) 07/02/2011  . Diabetes mellitus (HCC) 11/22/2010  . Diabetic foot ulcer associated with type 2 diabetes mellitus (HCC)   . Essential hypertension   . Arthritis   . Neuropathy (HCC)   . Intracerebral hemorrhage (HCC)    PCP:  Patient, No Pcp Per Pharmacy:   [id number] - [place] - [address]  ([place]) [place] [address] Phone: [phone] Fax: [phone]     Social Determinants of Health (SDOH) Interventions    Readmission Risk Interventions Readmission Risk Prevention Plan 04/13/2019 11/13/2018  Transportation Screening Complete Complete  PCP or Specialist Appt within 5-7 Days - Complete  PCP or Specialist Appt within 3-5 Days Complete -  Home Care Screening - Complete  Medication Review (RN CM) - Complete  HRI or Home Care Consult Complete -  Social Work Consult for Recovery Care Planning/Counseling Complete -  Palliative Care Screening Not Applicable -  Medication Review Oceanographer) Complete -  Some recent data might be hidden

## 2019-04-13 NOTE — Progress Notes (Signed)
PROGRESS NOTE    [name]  JIR:[id number] DOB: [birth year] DOA: 04/10/2019 PCP: Patient, No Pcp Per  Brief Narrative:[name] is a 54 y.o. female with medical history significant of hypertension, DM type II, anemia, ITP, s/p right BKA presented with complaints of left leg pain and swelling over the last 3 weeks.  Swelling actually goes up into her abdomen.  -  Evaluated on 3/16 in the emergency department for the same.  It appears BNP was elevated at 627.6 at that time and she was given 80 mg of Lasix along with a short prescription for Lasix 20 mg daily.  - She tried to follow-up with her primary care provider but had not been able to get an appointment yet.  Due to worsening symptoms she came to the hospital for further evaluation.  -In the emergency room she was noted to have creatinine of 1.3, BNP of 912, chest x-ray noted cardiomegaly and vascular congestion, urinalysis was abnormal  Assessment & Plan:   Acute diastolic CHF -Presenting with fluid overload, echocardiogram with preserved EF, unable to assess RV and diastolic function -Clinically volume overloaded, continue IV Lasix today -She is -5.7 L -Wean O2 as tolerated -Dietitian consult -Case management consult, patient has limited social support with bilateral cataracts, BKA and multiple medical problems -We will need close follow-up and home health nursing support  Hyperkalemia:  -Resolved with Lasix, monitor  Urinary tract infection: Ecoli  -Urine culture with pansensitive E. Coli -change to Po keflex  Chronic kidney disease stage IIIa: Patient creatinine on admission noted to be 1.33.  Appears along patient's baseline which ranges from 1.3-1.6. -Stable, monitor with diuresis  Diabetes mellitus type 2: Home medications include Metformin 500 mg daily.  -CBG stable, hemoglobin A1c is 5.5  Obesity -BMI 40.6  Essential hypertension: Home blood pressure medications include amlodipine 10 mg  daily. -Held amlodipine on admission, monitor with diuresis  History of ITP: Platelet counts currently within normal limits.  -Followed by Dr. Pamelia Hoit of hematology and treated with IVIG -Continue outpatient follow-up  Cataracts: Patient reports that she is in the process of being set up in outpatient setting for removal of the cataracts. -Continue outpatient follow-up, case management assistance  Status post BKA: Patient reports difficulty ambulating due to swelling. -PT to eval  DVT prophylaxis: Lovenox Code Status: Full Family Communication:  Discussed patient in detail Disposition Plan:  Home pending improvement in volume status, adequate diuresis, likely 48 hours   Consultants:      Procedures:   Antimicrobials:   Subjective -Feels better, breathing is improving, tightness in her belly and upper thigh is also improving  Objective: Vitals:   04/12/19 1957 04/13/19 0110 04/13/19 0403 04/13/19 0816  BP:  (!) 162/78 (!) 146/83 (!) 149/68  Pulse:  88 80 82  Resp:  19 16 17   Temp:  98.2 F (36.8 C) 98.3 F (36.8 C) 97.7 F (36.5 C)  TempSrc:  Oral Oral Oral  SpO2: 96% 96% 90% 96%  Weight:  111.9 kg    Height:        Intake/Output Summary (Last 24 hours) at 04/13/2019 1212 Last data filed at 04/13/2019 0955 Gross per 24 hour  Intake 1067.4 ml  Output 4500 ml  Net -3432.6 ml   Filed Weights   04/10/19 1608 04/12/19 0508 04/13/19 0110  Weight: 103.5 kg 114.2 kg 111.9 kg    Examination:  Gen: Obese blind pleasant female, sitting up in bed, AAO x3 HEENT: + JVD Lungs: bibasilar rales  CVS: RRR Abd: soft, Non tender, non distended, BS present Extremities: 2+ edema, right BKA Skin: no new rashes Psychiatry: Judgement and insight appear normal. Mood & affect appropriate.   Data Reviewed:   CBC: Recent Labs  Lab 04/10/19 1610 04/12/19 0517  WBC 3.9* 3.0*  NEUTROABS  --  1.8  HGB 9.4* 9.2*  HCT 32.7* 31.0*  MCV 93.7 92.0  PLT 189 208   Basic  Metabolic Panel: Recent Labs  Lab 04/10/19 1610 04/11/19 1203 04/12/19 0517 04/13/19 0437  NA 133*  --  135 135  K 6.0* 5.2* 4.7 4.5  CL 106  --  104 100  CO2 19*  --  24 25  GLUCOSE 100*  --  89 95  BUN 27*  --  24* 27*  CREATININE 1.33*  --  1.39* 1.45*  CALCIUM 8.4*  --  8.4* 8.8*   GFR: Estimated Creatinine Clearance: 57.5 mL/min (A) (by C-G formula based on SCr of 1.45 mg/dL (H)). Liver Function Tests: Recent Labs  Lab 04/11/19 0351  AST 52*  ALT 8  ALKPHOS 62  BILITOT 0.7  PROT 9.7*  ALBUMIN 3.2*   No results for input(s): LIPASE, AMYLASE in the last 168 hours. No results for input(s): AMMONIA in the last 168 hours. Coagulation Profile: No results for input(s): INR, PROTIME in the last 168 hours. Cardiac Enzymes: No results for input(s): CKTOTAL, CKMB, CKMBINDEX, TROPONINI in the last 168 hours. BNP (last 3 results) No results for input(s): PROBNP in the last 8760 hours. HbA1C: Recent Labs    04/11/19 0851 04/12/19 0517  HGBA1C 5.3 5.5   CBG: Recent Labs  Lab 04/12/19 0713 04/12/19 1205 04/12/19 1657 04/12/19 2110 04/13/19 0630  GLUCAP 75 99 115* 138* 93   Lipid Profile: No results for input(s): CHOL, HDL, LDLCALC, TRIG, CHOLHDL, LDLDIRECT in the last 72 hours. Thyroid Function Tests: Recent Labs    04/11/19 1203  TSH 3.037   Anemia Panel: No results for input(s): VITAMINB12, FOLATE, FERRITIN, TIBC, IRON, RETICCTPCT in the last 72 hours. Urine analysis:    Component Value Date/Time   COLORURINE YELLOW 04/11/2019 0519   APPEARANCEUR HAZY (A) 04/11/2019 0519   LABSPEC 1.005 04/11/2019 0519   PHURINE 6.0 04/11/2019 0519   GLUCOSEU NEGATIVE 04/11/2019 0519   HGBUR SMALL (A) 04/11/2019 0519   BILIRUBINUR NEGATIVE 04/11/2019 0519   KETONESUR NEGATIVE 04/11/2019 0519   PROTEINUR 30 (A) 04/11/2019 0519   UROBILINOGEN 2.0 (H) 07/08/2011 0051   NITRITE POSITIVE (A) 04/11/2019 0519   LEUKOCYTESUR MODERATE (A) 04/11/2019 0519   Sepsis  Labs: @LABRCNTIP (procalcitonin:4,lacticidven:4)  ) Recent Results (from the past 240 hour(s))  Urine culture     Status: Abnormal   Collection Time: 04/11/19  6:08 AM   Specimen: Urine, Random  Result Value Ref Range Status   Specimen Description URINE, RANDOM  Final   Special Requests NONE  Final   Culture (A)  Final    >=100,000 COLONIES/mL ESCHERICHIA COLI SUSCEPTIBILITIES TO FOLLOW Performed at [facility], [address] [place], [place]    Report Status 04/13/2019 FINAL  Final   Organism ID, Bacteria ESCHERICHIA COLI (A)  Final      Susceptibility   Escherichia coli - MIC*    AMPICILLIN <=2 SENSITIVE Sensitive     CEFAZOLIN <=4 SENSITIVE Sensitive     CEFTRIAXONE <=0.25 SENSITIVE Sensitive     CIPROFLOXACIN <=0.25 SENSITIVE Sensitive     GENTAMICIN <=1 SENSITIVE Sensitive     IMIPENEM <=0.25 SENSITIVE  Sensitive     NITROFURANTOIN <=16 SENSITIVE Sensitive     TRIMETH/SULFA <=20 SENSITIVE Sensitive     AMPICILLIN/SULBACTAM <=2 SENSITIVE Sensitive     PIP/TAZO <=4 SENSITIVE Sensitive     * >=100,000 COLONIES/mL ESCHERICHIA COLI  SARS CORONAVIRUS 2 (TAT 6-24 HRS) Nasopharyngeal Nasopharyngeal Swab     Status: None   Collection Time: 04/11/19  6:26 AM   Specimen: Nasopharyngeal Swab  Result Value Ref Range Status   SARS Coronavirus 2 NEGATIVE NEGATIVE Final    Comment: (NOTE) SARS-CoV-2 target nucleic acids are NOT DETECTED. The SARS-CoV-2 RNA is generally detectable in upper and lower respiratory specimens during the acute phase of infection. Negative results do not preclude SARS-CoV-2 infection, do not rule out co-infections with other pathogens, and should not be used as the sole basis for treatment or other patient management decisions. Negative results must be combined with clinical observations, patient history, and epidemiological information. The expected result is Negative. Fact Sheet for  Patients: SugarRoll.be Fact Sheet for Healthcare Providers: https://www.woods-mathews.com/ This test is not yet approved or cleared by the [place] FDA and  has been authorized for detection and/or diagnosis of SARS-CoV-2 by FDA under an Emergency Use Authorization (EUA). This EUA will remain  in effect (meaning this test can be used) for the duration of the COVID-19 declaration under Section 56 4(b)(1) of the Act, 21 U.S.C. section 360bbb-3(b)(1), unless the authorization is terminated or revoked sooner. Performed at [facility], [place] [address] [place], [place] [address]          Radiology Studies: ECHOCARDIOGRAM COMPLETE  Result Date: 04/11/2019    ECHOCARDIOGRAM REPORT   Patient Name:   [name] Date of Exam: 04/11/2019 Medical Rec #:  [id number]         Height:       66.0 in Accession #:    [id number]        Weight:       228.2 lb Date of Birth:  07-16-1965         BSA:          2.115 m Patient Age:    32 years          BP:           151/64 mmHg Patient Gender: F                 HR:           90 bpm. Exam Location:  Inpatient Procedure: 2D Echo, Cardiac Doppler and Color Doppler Indications:    CHF-Acute Diastolic 785.88/F02.77  History:        Patient has prior history of Echocardiogram examinations, most                 recent 01/10/66. CHF; Risk Factors:Diabetes and Hypertension.                 CKD.  Sonographer:    Clayton Lefort RDCS (AE) Referring Phys: [id number] [facility] A SMITH  Sonographer Comments: Image acquisition challenging due to uncooperative patient. Patient refused remainder of test after finishing PLAX and SAX images, stating that she "didn't know it was going to be all this." She also did not want to remover her bra or get gel on her bra. IMPRESSIONS  1. Left ventricular ejection fraction, by estimation, is 65 to 70%. The left ventricle has normal function. The left ventricle has no regional wall motion abnormalities.  There is moderate left ventricular hypertrophy. Left  ventricular diastolic function  could not be evaluated.  2. Right ventricular systolic function was not well visualized. The right ventricular size is not well visualized.  3. The mitral valve is grossly normal. Trivial mitral valve regurgitation.  4. The aortic valve was not well visualized. Aortic valve regurgitation is not visualized. FINDINGS  Left Ventricle: Left ventricular ejection fraction, by estimation, is 65 to 70%. The left ventricle has normal function. The left ventricle has no regional wall motion abnormalities. The left ventricular internal cavity size was normal in size. There is  moderate left ventricular hypertrophy. Left ventricular diastolic function could not be evaluated. Right Ventricle: The right ventricular size is not well visualized. Right vetricular wall thickness was not assessed. Right ventricular systolic function was not well visualized. Left Atrium: Left atrial size was not well visualized. Right Atrium: Right atrial size was not well visualized. Pericardium: There is no evidence of pericardial effusion. Mitral Valve: The mitral valve is grossly normal. Trivial mitral valve regurgitation. Tricuspid Valve: The tricuspid valve is grossly normal. Tricuspid valve regurgitation is mild. Aortic Valve: The aortic valve was not well visualized. Aortic valve regurgitation is not visualized. Pulmonic Valve: The pulmonic valve was normal in structure. Pulmonic valve regurgitation is not visualized. Aorta: The aortic root is normal in size and structure. IAS/Shunts: The interatrial septum was not well visualized.  LEFT VENTRICLE PLAX 2D LVIDd:         5.60 cm LVIDs:         3.10 cm LV PW:         1.30 cm LV IVS:        1.20 cm LVOT diam:     2.20 cm LVOT Area:     3.80 cm  LEFT ATRIUM         Index LA diam:    3.00 cm 1.42 cm/m   AORTA Ao Root diam: 3.00 cm TRICUSPID VALVE TR Peak grad:   28.9 mmHg TR Vmax:        269.00 cm/s  SHUNTS  Systemic Diam: 2.20 cm Zoila Shutter MD Electronically signed by Zoila Shutter MD Signature Date/Time: 04/11/2019/4:16:37 PM    Final         Scheduled Meds: . amLODipine  10 mg Oral Daily  . enoxaparin (LOVENOX) injection  40 mg Subcutaneous Q24H  . furosemide  40 mg Intravenous BID  . gabapentin  400 mg Oral BID  . insulin aspart  0-9 Units Subcutaneous TID WC  . sodium chloride flush  3 mL Intravenous Q12H   Continuous Infusions: . sodium chloride 250 mL (04/13/19 0631)  . cefTRIAXone (ROCEPHIN)  IV 1 g (04/13/19 8110)     LOS: 2 days    Time spent:  Zannie Cove, MD [facility]  04/13/2019, 12:12 PM

## 2019-04-14 DIAGNOSIS — I5031 Acute diastolic (congestive) heart failure: Secondary | ICD-10-CM

## 2019-04-14 DIAGNOSIS — N1831 Chronic kidney disease, stage 3a: Secondary | ICD-10-CM | POA: Diagnosis not present

## 2019-04-14 DIAGNOSIS — Z862 Personal history of diseases of the blood and blood-forming organs and certain disorders involving the immune mechanism: Secondary | ICD-10-CM | POA: Diagnosis not present

## 2019-04-14 DIAGNOSIS — N3000 Acute cystitis without hematuria: Secondary | ICD-10-CM | POA: Diagnosis not present

## 2019-04-14 DIAGNOSIS — I5033 Acute on chronic diastolic (congestive) heart failure: Secondary | ICD-10-CM

## 2019-04-14 LAB — BASIC METABOLIC PANEL
Anion gap: 10 (ref 5–15)
BUN: 29 mg/dL — ABNORMAL HIGH (ref 6–20)
CO2: 27 mmol/L (ref 22–32)
Calcium: 8.8 mg/dL — ABNORMAL LOW (ref 8.9–10.3)
Chloride: 99 mmol/L (ref 98–111)
Creatinine, Ser: 1.41 mg/dL — ABNORMAL HIGH (ref 0.44–1.00)
GFR calc Af Amer: 50 mL/min — ABNORMAL LOW (ref >60)
GFR calc non Af Amer: 43 mL/min — ABNORMAL LOW (ref >60)
Glucose, Bld: 106 mg/dL — ABNORMAL HIGH (ref 70–99)
Potassium: 4.1 mmol/L (ref 3.5–5.1)
Sodium: 136 mmol/L (ref 135–145)

## 2019-04-14 LAB — GLUCOSE, CAPILLARY
Glucose-Capillary: 106 mg/dL — ABNORMAL HIGH (ref 70–99)
Glucose-Capillary: 116 mg/dL — ABNORMAL HIGH (ref 70–99)
Glucose-Capillary: 139 mg/dL — ABNORMAL HIGH (ref 70–99)
Glucose-Capillary: 155 mg/dL — ABNORMAL HIGH (ref 70–99)

## 2019-04-14 LAB — BRAIN NATRIURETIC PEPTIDE: B Natriuretic Peptide: 452.2 pg/mL — ABNORMAL HIGH (ref 0.0–100.0)

## 2019-04-14 MED ORDER — METOPROLOL SUCCINATE ER 25 MG PO TB24
25.0000 mg | ORAL_TABLET | Freq: Every day | ORAL | Status: DC
  Administered 2019-04-14 – 2019-04-15 (×2): 25 mg via ORAL
  Filled 2019-04-14 (×2): qty 1

## 2019-04-14 NOTE — Progress Notes (Signed)
PROGRESS NOTE    [name]  HQP:[id number] DOB: [birth year] DOA: 04/10/2019 PCP: Patient, No Pcp Per    Brief Narrative:  [name] a 54 y.o.femalewith medical history significant ofhypertension, DM type II, anemia,ITP, s/pright BKA presented with complaints ofleft leg pain and swelling over the last 3 weeks. Swelling actually goes up into her abdomen.  - Evaluated on 3/16 in the emergency department for the same. It appears BNP was elevated at 627.6 at that time and she was given 80 mg of Lasix along with a short prescription for Lasix 20 mg daily.  -She tried to follow-up with her primary care provider but had not been able to get an appointment yet. Due to worsening symptoms she came to the hospital for further evaluation.  -In the emergency room she was noted to have creatinine of 1.3, BNP of 912, chest x-ray noted cardiomegaly and vascular congestion, urinalysis was abnormal    Consultants:   Cardiology  Procedures:   Antimicrobials:       Subjective: Sleepy this morning, Falls asleep during exam.  But denies sob.   Objective: Vitals:   04/14/19 0111 04/14/19 0609 04/14/19 0820 04/14/19 1156  BP:  (!) 157/73 (!) 152/83 138/65  Pulse:  86 86 85  Resp:  18 19 20   Temp:  97.8 F (36.6 C) 98.3 F (36.8 C) 98.9 F (37.2 C)  TempSrc:  Oral Oral Oral  SpO2:  95% 93% (!) 87%  Weight: 109.2 kg     Height:        Intake/Output Summary (Last 24 hours) at 04/14/2019 1334 Last data filed at 04/14/2019 1054 Gross per 24 hour  Intake 728.15 ml  Output 3400 ml  Net -2671.85 ml   Filed Weights   04/12/19 0508 04/13/19 0110 04/14/19 0111  Weight: 114.2 kg 111.9 kg 109.2 kg    Examination:  General exam: Appears calm and comfortable , falls asleep on me Respiratory system: Clear to auscultation. Respiratory effort poor anteriorly Cardiovascular system: S1 & S2 heard, RRR. No JVD, murmurs, rubs, gallops or clicks. . Gastrointestinal system:  Abdomen is nondistended, soft and nontender. Normal bowel sounds heard. Central nervous system: sleep, hard to assess Extremities: Positive edema Skin: Warm dry Psychiatry: Mood & affect appropriate in current setting.     Data Reviewed: I have personally reviewed following labs and imaging studies  CBC: Recent Labs  Lab 04/10/19 1610 04/12/19 0517  WBC 3.9* 3.0*  NEUTROABS  --  1.8  HGB 9.4* 9.2*  HCT 32.7* 31.0*  MCV 93.7 92.0  PLT 189 208   Basic Metabolic Panel: Recent Labs  Lab 04/10/19 1610 04/11/19 1203 04/12/19 0517 04/13/19 0437 04/14/19 0426  NA 133*  --  135 135 136  K 6.0* 5.2* 4.7 4.5 4.1  CL 106  --  104 100 99  CO2 19*  --  24 25 27   GLUCOSE 100*  --  89 95 106*  BUN 27*  --  24* 27* 29*  CREATININE 1.33*  --  1.39* 1.45* 1.41*  CALCIUM 8.4*  --  8.4* 8.8* 8.8*   GFR: Estimated Creatinine Clearance: 58.4 mL/min (A) (by C-G formula based on SCr of 1.41 mg/dL (H)). Liver Function Tests: Recent Labs  Lab 04/11/19 0351  AST 52*  ALT 8  ALKPHOS 62  BILITOT 0.7  PROT 9.7*  ALBUMIN 3.2*   No results for input(s): LIPASE, AMYLASE in the last 168 hours. No results for input(s): AMMONIA in the last 168 hours.  Coagulation Profile: No results for input(s): INR, PROTIME in the last 168 hours. Cardiac Enzymes: No results for input(s): CKTOTAL, CKMB, CKMBINDEX, TROPONINI in the last 168 hours. BNP (last 3 results) No results for input(s): PROBNP in the last 8760 hours. HbA1C: Recent Labs    04/12/19 0517  HGBA1C 5.5   CBG: Recent Labs  Lab 04/13/19 1242 04/13/19 1710 04/13/19 2107 04/14/19 0617 04/14/19 1120  GLUCAP 91 115* 129* 106* 116*   Lipid Profile: No results for input(s): CHOL, HDL, LDLCALC, TRIG, CHOLHDL, LDLDIRECT in the last 72 hours. Thyroid Function Tests: No results for input(s): TSH, T4TOTAL, FREET4, T3FREE, THYROIDAB in the last 72 hours. Anemia Panel: No results for input(s): VITAMINB12, FOLATE, FERRITIN, TIBC, IRON,  RETICCTPCT in the last 72 hours. Sepsis Labs: No results for input(s): PROCALCITON, LATICACIDVEN in the last 168 hours.  Recent Results (from the past 240 hour(s))  Urine culture     Status: Abnormal   Collection Time: 04/11/19  6:08 AM   Specimen: Urine, Random  Result Value Ref Range Status   Specimen Description URINE, RANDOM  Final   Special Requests NONE  Final   Culture (A)  Final    >=100,000 COLONIES/mL ESCHERICHIA COLI SUSCEPTIBILITIES TO FOLLOW Performed at [facility], [address], [place], [place] [address]    Report Status 04/13/2019 FINAL  Final   Organism ID, Bacteria ESCHERICHIA COLI (A)  Final      Susceptibility   Escherichia coli - MIC*    AMPICILLIN <=2 SENSITIVE Sensitive     CEFAZOLIN <=4 SENSITIVE Sensitive     CEFTRIAXONE <=0.25 SENSITIVE Sensitive     CIPROFLOXACIN <=0.25 SENSITIVE Sensitive     GENTAMICIN <=1 SENSITIVE Sensitive     IMIPENEM <=0.25 SENSITIVE Sensitive     NITROFURANTOIN <=16 SENSITIVE Sensitive     TRIMETH/SULFA <=20 SENSITIVE Sensitive     AMPICILLIN/SULBACTAM <=2 SENSITIVE Sensitive     PIP/TAZO <=4 SENSITIVE Sensitive     * >=100,000 COLONIES/mL ESCHERICHIA COLI  SARS CORONAVIRUS 2 (TAT 6-24 HRS) Nasopharyngeal Nasopharyngeal Swab     Status: None   Collection Time: 04/11/19  6:26 AM   Specimen: Nasopharyngeal Swab  Result Value Ref Range Status   SARS Coronavirus 2 NEGATIVE NEGATIVE Final    Comment: (NOTE) SARS-CoV-2 target nucleic acids are NOT DETECTED. The SARS-CoV-2 RNA is generally detectable in upper and lower respiratory specimens during the acute phase of infection. Negative results do not preclude SARS-CoV-2 infection, do not rule out co-infections with other pathogens, and should not be used as the sole basis for treatment or other patient management decisions. Negative results must be combined with clinical observations, patient history, and epidemiological information. The expected result is  Negative. Fact Sheet for Patients: HairSlick.no Fact Sheet for Healthcare Providers: quierodirigir.com This test is not yet approved or cleared by the [place] FDA and  has been authorized for detection and/or diagnosis of SARS-CoV-2 by FDA under an Emergency Use Authorization (EUA). This EUA will remain  in effect (meaning this test can be used) for the duration of the COVID-19 declaration under Section 56 4(b)(1) of the Act, 21 U.S.C. section 360bbb-3(b)(1), unless the authorization is terminated or revoked sooner. Performed at [facility], [address], [place], [place] [address]          Radiology Studies: No results found.      Scheduled Meds: . amLODipine  10 mg Oral Daily  . cephALEXin  250 mg Oral Q8H  . enoxaparin (  LOVENOX) injection  40 mg Subcutaneous Q24H  . furosemide  40 mg Intravenous BID  . gabapentin  400 mg Oral BID  . insulin aspart  0-9 Units Subcutaneous TID WC  . metoprolol succinate  25 mg Oral Daily  . sodium chloride flush  3 mL Intravenous Q12H   Continuous Infusions: . sodium chloride 250 mL (04/13/19 0631)    Assessment & Plan:   Principal Problem:   Acute exacerbation of CHF (congestive heart failure) (HCC) Active Problems:   S/P BKA (below knee amputation) unilateral (HCC)   Type II diabetes mellitus (HCC)   Hyperkalemia   CKD (chronic kidney disease) stage 3, GFR 30-59 ml/min   Leukopenia   History of ITP  Acute diastolic CHF -Presenting with fluid overload, echocardiogram with preserved EF, unable to assess RV and diastolic function -Still volume overloaded, will continue Lasix 40 mg IV twice daily with possible transition to oral in a.m.  Cardiologist input was appreciated  -Case management consult, patient has limited social support with bilateral cataracts, BKA and multiple medical problems -We will need close follow-up and home health nursing  support  Hyperkalemia:  -Resolved with Lasix, monitor  Urinary tract infection: Ecoli -Urine culture with pansensitive E. Coli -change to Po keflex  Chronic kidney disease stage IIIa: Patient creatinine on admission noted to be 1.33. Appears along patient's baseline which ranges from 1.3-1.6. -Stable, monitor with diuresis  Diabetes mellitus type 2: Home medications include Metformin 500 mg daily.  -CBG stable, hemoglobin A1c is 5.5  Obesity -BMI 40.6  Essential hypertension: Home blood pressure medications include amlodipine 10 mg daily. -Held amlodipine on admission, monitor with diuresis  History ofITP: Platelet counts currentlywithin normal limits.  -Followed by Dr. Lindi Adie of hematology and treated with IVIG -Continue outpatient follow-up  Cataracts: Patient reports that she is in the process of being set up in outpatient setting for removal of the cataracts. -Continue outpatient follow-up, case management assistance  Status post BKA: Patient reports difficulty ambulating due to swelling. -PT to eval   DVT prophylaxis:Lovenox Code Status:Full Family Communication: Discussed patient in detail Disposition Plan: Home pending improvement in volume status, adequate diuresis, likely 48 hours Barrier: Still volume overloaded requiring IV Lasix.      LOS: 3 days   Time spent: 45 min with >50% on coc    Nolberto Hanlon, MD [facility] Pager [phone]  If 7PM-7AM, please contact night-coverage [email or web address] Password [email or web address] 04/14/2019, 1:34 PM

## 2019-04-14 NOTE — Progress Notes (Signed)
Patient awake alert and oriented during shift report, denies complaints. 

## 2019-04-14 NOTE — Progress Notes (Signed)
Occupational Therapy Treatment Patient Details Name: [name] MRN: [id number] DOB: Jul 26, 1965 Today's Date: 04/14/2019    History of present illness 54 yo admitted with bil LE edema and CHF. PMHx: Rt BKA, Dm, HTN, neuropathy   OT comments  Pt. Seen for skilled OT treatment.  Able to complete bed mobilty S to eob.  Mod a with 2 person assist for sit/stand and pivot to/from bsc.  Cues for pivot and hand placement.   Will benefit from cont. Therapy with focus on ADLS and HEP for strengthening and endurance.  Follow Up Recommendations  Home health OT;Supervision/Assistance - 24 hour;Other (comment)    Equipment Recommendations  None recommended by OT    Recommendations for Other Services      Precautions / Restrictions Precautions Precautions: Fall Precaution Comments: pt with very low vision and reports progressive decline in vision for 2 years. Has a prosthesis but never used it       Mobility Bed Mobility Overal bed mobility: Needs Assistance Bed Mobility: Supine to Sit     Supine to sit: Supervision        Transfers Overall transfer level: Needs assistance Equipment used: Rolling walker (2 wheeled) Transfers: Sit to/from Stand Sit to Stand: Mod assist;+2 safety/equipment;+2 physical assistance   Squat pivot transfers: Mod assist;+2 physical assistance;+2 safety/equipment     General transfer comment: mod A to rise and steady in RW; increased time and cues to progress to hopping to [facility]. Heavy reliance on BUE support due to overall weakness    Balance                                           ADL either performed or assessed with clinical judgement   ADL Overall ADL's : Needs assistance/impaired                         Toilet Transfer: Moderate assistance;+2 for physical assistance;+2 for safety/equipment;BSC;Stand-pivot Toilet Transfer Details (indicate cue type and reason): mod A hopping transfer with RW. Increased VC's and  time to process instruction Toileting- Clothing Manipulation and Hygiene: Max assistance;Sit to/from stand. (unable to peform peri care while standing)       Functional mobility during ADLs: Moderate assistance;Rolling walker;Cueing for safety;Cueing for sequencing       Vision       Perception     Praxis      Cognition Arousal/Alertness: Awake/alert Behavior During Therapy: WFL for tasks assessed/performed Overall Cognitive Status: No family/caregiver present to determine baseline cognitive functioning                                          Exercises     Shoulder Instructions       General Comments      Pertinent Vitals/ Pain       Pain Assessment: No/denies pain  Home Living                                          Prior Functioning/Environment              Frequency  Min 2X/week        Progress  Toward Goals  OT Goals(current goals can now be found in the care plan section)  Progress towards OT goals: Progressing toward goals     Plan      Co-evaluation                 AM-PAC OT "6 Clicks" Daily Activity     Outcome Measure   Help from another person eating meals?: None Help from another person taking care of personal grooming?: None Help from another person toileting, which includes using toliet, bedpan, or urinal?: A Lot Help from another person bathing (including washing, rinsing, drying)?: A Lot   Help from another person to put on and taking off regular lower body clothing?: A Lot 6 Click Score: 14    End of Session Equipment Utilized During Treatment: Rolling walker  OT Visit Diagnosis: Unsteadiness on feet (R26.81);Other abnormalities of gait and mobility (R26.89);Muscle weakness (generalized) (M62.81)   Activity Tolerance Patient tolerated treatment well   Patient Left in bed;with call bell/phone within reach;with bed alarm set   Nurse Communication Other (comment)(alerted urine  collection container was full, cna came and changed it during session, also assisted with bsc transfers as +2 support)        Time: 6269-4854 OT Time Calculation (min): 26 min  Charges: OT General Charges $OT Visit: 1 Visit OT Treatments $Self Care/Home Management : 23-37 mins  Sonia Baller, COTA/L Acute Rehabilitation [phone]   Janice Coffin 04/14/2019, 10:49 AM

## 2019-04-14 NOTE — Progress Notes (Signed)
Progress Note  Patient Name: [name] Date of Encounter: 04/14/2019  Primary Cardiologist: New   Subjective   Had chest pain again overnight, resolved with one 81 mg ASA  Inpatient Medications    Scheduled Meds: . amLODipine  10 mg Oral Daily  . cephALEXin  250 mg Oral Q8H  . enoxaparin (LOVENOX) injection  40 mg Subcutaneous Q24H  . furosemide  40 mg Intravenous BID  . gabapentin  400 mg Oral BID  . insulin aspart  0-9 Units Subcutaneous TID WC  . sodium chloride flush  3 mL Intravenous Q12H   Continuous Infusions: . sodium chloride 250 mL (04/13/19 0631)   PRN Meds: sodium chloride, acetaminophen, aspirin EC, gabapentin, HYDROcodone-acetaminophen, ondansetron (ZOFRAN) IV, sodium chloride flush   Vital Signs    Vitals:   04/13/19 1713 04/13/19 1954 04/14/19 0104 04/14/19 0111  BP: 138/67 (!) 152/70 139/74   Pulse: 87 89 84   Resp: 18 18 19    Temp: 98.2 F (36.8 C) 98 F (36.7 C) 98.1 F (36.7 C)   TempSrc: Oral Oral Oral   SpO2: 100% 97% 97%   Weight:    109.2 kg  Height:        Intake/Output Summary (Last 24 hours) at 04/14/2019 0527 Last data filed at 04/14/2019 0111 Gross per 24 hour  Intake 1080 ml  Output 3600 ml  Net -2520 ml   Filed Weights   04/12/19 0508 04/13/19 0110 04/14/19 0111  Weight: 114.2 kg 111.9 kg 109.2 kg    Physical Exam   GEN: No acute distress.   Neck: JVD not well appreciated Cardiac: RRR, no murmurs, rubs, or gallops.  Respiratory: Clear to auscultation bilaterally. GI: Soft, nontender, non-distended  MS: 1+ edema; R BKA Neuro:  Nonfocal  Psych: Normal affect    Labs    Chemistry Recent Labs  Lab 04/10/19 1610 04/10/19 1610 04/11/19 0351 04/11/19 1203 04/12/19 0517 04/13/19 0437  NA 133*  --   --   --  135 135  K 6.0*   < >  --  5.2* 4.7 4.5  CL 106  --   --   --  104 100  CO2 19*  --   --   --  24 25  GLUCOSE 100*  --   --   --  89 95  BUN 27*  --   --   --  24* 27*  CREATININE 1.33*  --    --   --  1.39* 1.45*  CALCIUM 8.4*  --   --   --  8.4* 8.8*  PROT  --   --  9.7*  --   --   --   ALBUMIN  --   --  3.2*  --   --   --   AST  --   --  52*  --   --   --   ALT  --   --  8  --   --   --   ALKPHOS  --   --  62  --   --   --   BILITOT  --   --  0.7  --   --   --   GFRNONAA 46*  --   --   --  43* 41*  GFRAA 53*  --   --   --  50* 48*  ANIONGAP 8  --   --   --  7 10   < > =  values in this interval not displayed.     Hematology Recent Labs  Lab 04/10/19 1610 04/12/19 0517  WBC 3.9* 3.0*  RBC 3.49* 3.37*  HGB 9.4* 9.2*  HCT 32.7* 31.0*  MCV 93.7 92.0  MCH 26.9 27.3  MCHC 28.7* 29.7*  RDW 18.6* 18.6*  PLT 189 208    Cardiac EnzymesNo results for input(s): TROPONINI in the last 168 hours. No results for input(s): TROPIPOC in the last 168 hours.   BNP Recent Labs  Lab 04/11/19 0351  BNP 912.4*     DDimer No results for input(s): DDIMER in the last 168 hours.   Radiology    No results found. Telemetry    NSR - Personally Reviewed  ECG    No new tracing as of 04/13/2019- Personally Reviewed  Cardiac Studies   ECHO: 04/11/2019:   1. Left ventricular ejection fraction, by estimation, is 65 to 70%. The  left ventricle has normal function. The left ventricle has no regional  wall motion abnormalities. There is moderate left ventricular hypertrophy.  Left ventricular diastolic function  could not be evaluated.  2. Right ventricular systolic function was not well visualized. The right  ventricular size is not well visualized.  3. The mitral valve is grossly normal. Trivial mitral valve  regurgitation.  4. The aortic valve was not well visualized. Aortic valve regurgitation  is not visualized.   Patient Profile     54 y.o. female with a hx of hypertension, DM type II, anemia,ITP and s/pright BKA who is being seen today for the evaluation of CHF at the request of Dr. Katrinka Blazing.  Assessment & Plan    1.  Acute presumed diastolic CHF exacerbation:  -Patient presented with a 3-week history of lower extremity edema and DOE symptoms found to be significantly volume overloaded on exam with an elevated BNP at 912 and subsequent CXR with pulmonary edema -Given IV Lasix 40 mg in the ED with good response therefore continued on IV Lasix 40 mg twice daily -Recently seen in the ED on 03/20/2019 for similar symptoms at which time she was treated with 80 mg IV Lasix x1 and discharged home on p.o. Lasix 20 mg daily for short course.  Patient reports initial response with IV Lasix however subsequent symptom return with p.o. dosing. Cr: 1.39>1.45>1.41 UOP yesterday: -3.6 L Weight: 251>246>240 lb Infusions: n/a Net negative for admission: -7.5L Diuresis: continue lasix 40 mg IV BID, consider oral transition tomorrow  2. Chest pain:  She has chest pain intermittently usually once a month for which she takes 1 to 2, 81 mg aspirin and it goes away.  Over the past week she had an episode of chest pain every other day and has had 1 the past 2 nights while in hospital.  She received an aspirin and her chest pain goes away.  I am concerned with her risk factors for angina and coronary disease.  We discussed coronary angiography, I have asked the patient to consider this and we can potentially pursue angiography on Monday.  This may be chest pain from heart failure, however she has enough risk factors and an ischemic evaluation is warranted, and I am concerned that with her risk factors, it may be more appropriate to proceed to heart catheterization.  3.  CKD stage III: -Creatinine, 1.45 from 1.33 yesterday. May be close to baseline dry weight -Baseline appears to be in the 1.3-1.6 range -Trend closely with diuresis  4.  DM2: -SSI for glucose control inpatient status per  primary team -On PTA Metformin>> currently on hold  5.  Essential hypertension: - hypertensive, 149/68, 146/83, 162/78 - amlodipine 10 mg - needs additional BP control, will add metoprolol  succ. 25mg  daily, she will need an additional agent like spironolactone or ACEI/ARB after diuresis has stabilized and renal function is stable.  6.  Hyperkalemia: -On ED presentation, K+ found to be >6.0.  She was treated with IV Lasix at which time potassium levels have trended down -K+ today, 4.1 -Continue to monitor with daily BMET  Other hospital problems per primary team include: -UTI with leukocytosis -History of ITP -Cataracts -Status post BKA   , MD 04/14/19 5:27 AM

## 2019-04-15 DIAGNOSIS — E875 Hyperkalemia: Secondary | ICD-10-CM | POA: Diagnosis not present

## 2019-04-15 DIAGNOSIS — N1831 Chronic kidney disease, stage 3a: Secondary | ICD-10-CM | POA: Diagnosis not present

## 2019-04-15 DIAGNOSIS — I5031 Acute diastolic (congestive) heart failure: Secondary | ICD-10-CM | POA: Diagnosis not present

## 2019-04-15 DIAGNOSIS — Z862 Personal history of diseases of the blood and blood-forming organs and certain disorders involving the immune mechanism: Secondary | ICD-10-CM | POA: Diagnosis not present

## 2019-04-15 LAB — BASIC METABOLIC PANEL
Anion gap: 12 (ref 5–15)
BUN: 36 mg/dL — ABNORMAL HIGH (ref 6–20)
CO2: 26 mmol/L (ref 22–32)
Calcium: 8.6 mg/dL — ABNORMAL LOW (ref 8.9–10.3)
Chloride: 98 mmol/L (ref 98–111)
Creatinine, Ser: 1.45 mg/dL — ABNORMAL HIGH (ref 0.44–1.00)
GFR calc Af Amer: 48 mL/min — ABNORMAL LOW (ref >60)
GFR calc non Af Amer: 41 mL/min — ABNORMAL LOW (ref >60)
Glucose, Bld: 151 mg/dL — ABNORMAL HIGH (ref 70–99)
Potassium: 4.3 mmol/L (ref 3.5–5.1)
Sodium: 136 mmol/L (ref 135–145)

## 2019-04-15 LAB — CBC
HCT: 34.6 % — ABNORMAL LOW (ref 36.0–46.0)
Hemoglobin: 10.3 g/dL — ABNORMAL LOW (ref 12.0–15.0)
MCH: 26.6 pg (ref 26.0–34.0)
MCHC: 29.8 g/dL — ABNORMAL LOW (ref 30.0–36.0)
MCV: 89.4 fL (ref 80.0–100.0)
Platelets: 191 10*3/uL (ref 150–400)
RBC: 3.87 MIL/uL (ref 3.87–5.11)
RDW: 17.9 % — ABNORMAL HIGH (ref 11.5–15.5)
WBC: 3.2 10*3/uL — ABNORMAL LOW (ref 4.0–10.5)
nRBC: 0 % (ref 0.0–0.2)

## 2019-04-15 LAB — GLUCOSE, CAPILLARY
Glucose-Capillary: 144 mg/dL — ABNORMAL HIGH (ref 70–99)
Glucose-Capillary: 124 mg/dL — ABNORMAL HIGH (ref 70–99)
Glucose-Capillary: 136 mg/dL — ABNORMAL HIGH (ref 70–99)
Glucose-Capillary: 126 mg/dL — ABNORMAL HIGH (ref 70–99)

## 2019-04-15 LAB — BRAIN NATRIURETIC PEPTIDE: B Natriuretic Peptide: 415 pg/mL — ABNORMAL HIGH (ref 0.0–100.0)

## 2019-04-15 MED ORDER — FUROSEMIDE 40 MG PO TABS
40.0000 mg | ORAL_TABLET | Freq: Every day | ORAL | Status: DC
  Administered 2019-04-15 – 2019-04-16 (×2): 40 mg via ORAL
  Filled 2019-04-15 (×2): qty 1

## 2019-04-15 MED ORDER — FUROSEMIDE 10 MG/ML IJ SOLN
40.0000 mg | Freq: Once | INTRAMUSCULAR | Status: AC
  Administered 2019-04-15: 40 mg via INTRAVENOUS
  Filled 2019-04-15: qty 4

## 2019-04-15 MED ORDER — METOPROLOL SUCCINATE ER 50 MG PO TB24
50.0000 mg | ORAL_TABLET | Freq: Every day | ORAL | Status: DC
  Administered 2019-04-16 – 2019-04-19 (×4): 50 mg via ORAL
  Filled 2019-04-15 (×4): qty 1

## 2019-04-15 MED ORDER — METOPROLOL SUCCINATE ER 25 MG PO TB24
25.0000 mg | ORAL_TABLET | Freq: Once | ORAL | Status: AC
  Administered 2019-04-15: 25 mg via ORAL
  Filled 2019-04-15: qty 1

## 2019-04-15 NOTE — Progress Notes (Addendum)
Progress Note  Patient Name: [name] Date of Encounter: 04/15/2019  Primary Cardiologist: New   Subjective   Feels that the taut skin on thighs has improved. No chest pain overnight.   Inpatient Medications    Scheduled Meds: . amLODipine  10 mg Oral Daily  . cephALEXin  250 mg Oral Q8H  . enoxaparin (LOVENOX) injection  40 mg Subcutaneous Q24H  . furosemide  40 mg Oral Daily  . gabapentin  400 mg Oral BID  . insulin aspart  0-9 Units Subcutaneous TID WC  . metoprolol succinate  25 mg Oral Daily  . sodium chloride flush  3 mL Intravenous Q12H   Continuous Infusions: . sodium chloride 250 mL (04/13/19 0631)   PRN Meds: sodium chloride, acetaminophen, aspirin EC, gabapentin, HYDROcodone-acetaminophen, ondansetron (ZOFRAN) IV, sodium chloride flush   Vital Signs    Vitals:   04/14/19 1935 04/15/19 0416 04/15/19 0419 04/15/19 0847  BP: 139/64 129/64  (!) 141/70  Pulse: 81 78 76 73  Resp: 18 20    Temp: 98.9 F (37.2 C) 98.3 F (36.8 C)    TempSrc: Oral Oral    SpO2: 94% (!) 85% 93% 93%  Weight:  109.1 kg    Height:        Intake/Output Summary (Last 24 hours) at 04/15/2019 1053 Last data filed at 04/15/2019 0850 Gross per 24 hour  Intake 1760 ml  Output 3600 ml  Net -1840 ml   Filed Weights   04/13/19 0110 04/14/19 0111 04/15/19 0416  Weight: 111.9 kg 109.2 kg 109.1 kg    Physical Exam   GEN: No acute distress.   Neck: No JVD Cardiac: RRR, no murmurs, rubs, or gallops.  Respiratory: Clear to auscultation bilaterally. GI: Soft, nontender, non-distended  MS: trace edema, R BKA Neuro:  Nonfocal  Psych: Normal affect    Labs    Chemistry Recent Labs  Lab 04/11/19 0351 04/11/19 1203 04/13/19 0437 04/14/19 0426 04/15/19 0418  NA  --    < > 135 136 136  K  --    < > 4.5 4.1 4.3  CL  --    < > 100 99 98  CO2  --    < > 25 27 26   GLUCOSE  --    < > 95 106* 151*  BUN  --    < > 27* 29* 36*  CREATININE  --    < > 1.45* 1.41* 1.45*    CALCIUM  --    < > 8.8* 8.8* 8.6*  PROT 9.7*  --   --   --   --   ALBUMIN 3.2*  --   --   --   --   AST 52*  --   --   --   --   ALT 8  --   --   --   --   ALKPHOS 62  --   --   --   --   BILITOT 0.7  --   --   --   --   GFRNONAA  --    < > 41* 43* 41*  GFRAA  --    < > 48* 50* 48*  ANIONGAP  --    < > 10 10 12    < > = values in this interval not displayed.     Hematology Recent Labs  Lab 04/10/19 1610 04/12/19 0517  WBC 3.9* 3.0*  RBC 3.49* 3.37*  HGB 9.4* 9.2*  HCT 32.7* 31.0*  MCV 93.7 92.0  MCH 26.9 27.3  MCHC 28.7* 29.7*  RDW 18.6* 18.6*  PLT 189 208    Cardiac EnzymesNo results for input(s): TROPONINI in the last 168 hours. No results for input(s): TROPIPOC in the last 168 hours.   BNP Recent Labs  Lab 04/11/19 0351 04/14/19 0842 04/15/19 0418  BNP 912.4* 452.2* 415.0*     DDimer No results for input(s): DDIMER in the last 168 hours.   Radiology    No results found. Telemetry    NSR - Personally Reviewed  ECG    No new tracing as of 04/13/2019- Personally Reviewed  Cardiac Studies   ECHO: 04/11/2019:   1. Left ventricular ejection fraction, by estimation, is 65 to 70%. The  left ventricle has normal function. The left ventricle has no regional  wall motion abnormalities. There is moderate left ventricular hypertrophy.  Left ventricular diastolic function  could not be evaluated.  2. Right ventricular systolic function was not well visualized. The right  ventricular size is not well visualized.  3. The mitral valve is grossly normal. Trivial mitral valve  regurgitation.  4. The aortic valve was not well visualized. Aortic valve regurgitation  is not visualized.   Patient Profile     54 y.o. female with a hx of hypertension, DM type II, anemia,ITP and s/pright BKA who is being seen today for the evaluation of CHF at the request of Dr. Katrinka Blazing.  Assessment & Plan    1.  Acute presumed diastolic CHF exacerbation: -Patient presented  with a 3-week history of lower extremity edema and DOE symptoms found to be significantly volume overloaded on exam with an elevated BNP at 912 and subsequent CXR with pulmonary edema -Given IV Lasix 40 mg in the ED with good response therefore continued on IV Lasix 40 mg twice daily -Recently seen in the ED on 03/20/2019 for similar symptoms at which time she was treated with 80 mg IV Lasix x1 and discharged home on p.o. Lasix 20 mg daily for short course.  Patient reports initial response with IV Lasix however subsequent symptom return with p.o. dosing. Cr: 1.39>1.45>1.41> 1.45 UOP yesterday: -3.6 L Weight: 251>246>240>240lb Net negative for admission: -9L Diuresis: already received lasix 40 mg po. Will give one additional dose of lasix 40 mg IV today, reassess tomorrow. She still feels upper leg swelling.   2. Chest pain:  She has chest pain intermittently usually once a month for which she takes 1 to 2, 81 mg aspirin and it goes away.  Over the past week she had an episode of chest pain every other day and has had 1 the past 2 nights while in hospital.  She received an aspirin and her chest pain goes away.  I am concerned with her risk factors for angina and coronary disease.  We discussed coronary angiography, I have asked the patient to consider this and we can potentially pursue angiography on Monday.  This may be chest pain from heart failure, however she has enough risk factors and an ischemic evaluation is warranted, and I am concerned that with her risk factors, it may be more appropriate to proceed to heart catheterization. Normal EF, echo limited due to patient not wanting remainder of study.  She has a history of a recent exacerbation of her ITP with a platelet count of less than 5000 on 01/22/19 requiring dexamethasone and IVIG.  Followed by Dr. Pamelia Hoit as an outpatient.  Prior to proceeding with coronary angiography  which may involve antiplatelet therapy if intervention performed, request  hematology consultation to review safety of DAPT.  3.  CKD stage III: -Creatinine, 1.45. May be close to baseline dry weight -Baseline appears to be in the 1.3-1.6 range -Trend closely with diuresis and cath  4.  DM2: -SSI for glucose control inpatient status per primary team -On PTA Metformin>> currently on hold  5.  Essential hypertension: - hypertensive but improved after starting metoprolol. - amlodipine 10 mg - needs additional BP control - metoprolol succinate tolerated well, will uptitrate to 50 mg daily. - she will need an additional agent like spironolactone or ACEI/ARB after diuresis has stabilized and renal function is stable.  6.  Hyperkalemia: -On ED presentation, K+ found to be >6.0.  She was treated with IV Lasix at which time potassium levels have trended down -K+ today, 4.3 -Continue to monitor with daily BMET  Other hospital problems per primary team include: -UTI with leukocytosis -History of ITP -Cataracts -Status post BKA  Parke Poisson, MD 04/15/19 10:53 AM

## 2019-04-15 NOTE — Progress Notes (Signed)
I was asked to comment on this [name]'s case given her history of ITP the possibility of dual antiplatelet therapy. Her most recent count is 208 and 189. I see no restriction for the use any antiplatelet agents based on her current labs.   I recommend repeating cbc prior to cath for updated count. If any further hematology input needed, please contact Dr. Pamelia Hoit who is following her for ITP.

## 2019-04-15 NOTE — Progress Notes (Signed)
RN educated patient on NPO requirement for cath after midnight. Patient states she is not sure about procedure and would like to talk to Cardiologist in the morning before procedure. Will contact Cath lab in the morning to make them aware of patient's request. Will continue to monitor patient.

## 2019-04-15 NOTE — Progress Notes (Addendum)
PROGRESS NOTE    [name]  ZOX:[id number] DOB: 1965-01-24 DOA: 04/10/2019 PCP: Patient, No Pcp Per    Brief Narrative:  [name] a 54 y.o.femalewith medical history significant ofhypertension, DM type II, anemia,ITP, s/pright BKA presented with complaints ofleft leg pain and swelling over the last 3 weeks. Swelling actually goes up into her abdomen.  - Evaluated on 3/16 in the emergency department for the same. It appears BNP was elevated at 627.6 at that time and she was given 80 mg of Lasix along with a short prescription for Lasix 20 mg daily.  -She tried to follow-up with her primary care provider but had not been able to get an appointment yet. Due to worsening symptoms she came to the hospital for further evaluation.  -In the emergency room she was noted to have creatinine of 1.3, BNP of 912, chest x-ray noted cardiomegaly and vascular congestion, urinalysis was abnormal    Consultants:   Cardiology  Procedures:   Antimicrobials:       Subjective: Her sob is better per pt. No cp today. No new complaints.   Objective: Vitals:   04/14/19 1935 04/15/19 0416 04/15/19 0419 04/15/19 0847  BP: 139/64 129/64  (!) 141/70  Pulse: 81 78 76 73  Resp: 18 20    Temp: 98.9 F (37.2 C) 98.3 F (36.8 C)    TempSrc: Oral Oral    SpO2: 94% (!) 85% 93% 93%  Weight:  109.1 kg    Height:        Intake/Output Summary (Last 24 hours) at 04/15/2019 1217 Last data filed at 04/15/2019 0850 Gross per 24 hour  Intake 1760 ml  Output 2400 ml  Net -640 ml   Filed Weights   04/13/19 0110 04/14/19 0111 04/15/19 0416  Weight: 111.9 kg 109.2 kg 109.1 kg    Examination:  General exam: Appears calm and comfortable , nad Respiratory system: Clear to auscultation. Respiratory effort poor anteriorly Cardiovascular system: S1 & S2 heard, RRR. No JVD, murmurs, rubs, gallops or clicks. . Gastrointestinal system: Abdomen is nondistended, soft and nontender. Normal  bowel sounds heard. Central nervous system: Alert oriented x3, grossly intact Extremities: mild edema, overall decreased, Rt AKA Skin: Warm dry Psychiatry: Mood & affect appropriate in current setting.     Data Reviewed: I have personally reviewed following labs and imaging studies  CBC: Recent Labs  Lab 04/10/19 1610 04/12/19 0517  WBC 3.9* 3.0*  NEUTROABS  --  1.8  HGB 9.4* 9.2*  HCT 32.7* 31.0*  MCV 93.7 92.0  PLT 189 811   Basic Metabolic Panel: Recent Labs  Lab 04/10/19 1610 04/10/19 1610 04/11/19 1203 04/12/19 0517 04/13/19 0437 04/14/19 0426 04/15/19 0418  NA 133*  --   --  135 135 136 136  K 6.0*   < > 5.2* 4.7 4.5 4.1 4.3  CL 106  --   --  104 100 99 98  CO2 19*  --   --  24 25 27 26   GLUCOSE 100*  --   --  89 95 106* 151*  BUN 27*  --   --  24* 27* 29* 36*  CREATININE 1.33*  --   --  1.39* 1.45* 1.41* 1.45*  CALCIUM 8.4*  --   --  8.4* 8.8* 8.8* 8.6*   < > = values in this interval not displayed.   GFR: Estimated Creatinine Clearance: 56.7 mL/min (A) (by C-G formula based on SCr of 1.45 mg/dL (H)). Liver Function Tests:  Recent Labs  Lab 04/11/19 0351  AST 52*  ALT 8  ALKPHOS 62  BILITOT 0.7  PROT 9.7*  ALBUMIN 3.2*   No results for input(s): LIPASE, AMYLASE in the last 168 hours. No results for input(s): AMMONIA in the last 168 hours. Coagulation Profile: No results for input(s): INR, PROTIME in the last 168 hours. Cardiac Enzymes: No results for input(s): CKTOTAL, CKMB, CKMBINDEX, TROPONINI in the last 168 hours. BNP (last 3 results) No results for input(s): PROBNP in the last 8760 hours. HbA1C: No results for input(s): HGBA1C in the last 72 hours. CBG: Recent Labs  Lab 04/14/19 1120 04/14/19 1644 04/14/19 2107 04/15/19 0602 04/15/19 1201  GLUCAP 116* 139* 155* 144* 124*   Lipid Profile: No results for input(s): CHOL, HDL, LDLCALC, TRIG, CHOLHDL, LDLDIRECT in the last 72 hours. Thyroid Function Tests: No results for input(s):  TSH, T4TOTAL, FREET4, T3FREE, THYROIDAB in the last 72 hours. Anemia Panel: No results for input(s): VITAMINB12, FOLATE, FERRITIN, TIBC, IRON, RETICCTPCT in the last 72 hours. Sepsis Labs: No results for input(s): PROCALCITON, LATICACIDVEN in the last 168 hours.  Recent Results (from the past 240 hour(s))  Urine culture     Status: Abnormal   Collection Time: 04/11/19  6:08 AM   Specimen: Urine, Random  Result Value Ref Range Status   Specimen Description URINE, RANDOM  Final   Special Requests NONE  Final   Culture (A)  Final    >=100,000 COLONIES/mL ESCHERICHIA COLI SUSCEPTIBILITIES TO FOLLOW Performed at [facility], [address] [place], [place] [address]    Report Status 04/13/2019 FINAL  Final   Organism ID, Bacteria ESCHERICHIA COLI (A)  Final      Susceptibility   Escherichia coli - MIC*    AMPICILLIN <=2 SENSITIVE Sensitive     CEFAZOLIN <=4 SENSITIVE Sensitive     CEFTRIAXONE <=0.25 SENSITIVE Sensitive     CIPROFLOXACIN <=0.25 SENSITIVE Sensitive     GENTAMICIN <=1 SENSITIVE Sensitive     IMIPENEM <=0.25 SENSITIVE Sensitive     NITROFURANTOIN <=16 SENSITIVE Sensitive     TRIMETH/SULFA <=20 SENSITIVE Sensitive     AMPICILLIN/SULBACTAM <=2 SENSITIVE Sensitive     PIP/TAZO <=4 SENSITIVE Sensitive     * >=100,000 COLONIES/mL ESCHERICHIA COLI  SARS CORONAVIRUS 2 (TAT 6-24 HRS) Nasopharyngeal Nasopharyngeal Swab     Status: None   Collection Time: 04/11/19  6:26 AM   Specimen: Nasopharyngeal Swab  Result Value Ref Range Status   SARS Coronavirus 2 NEGATIVE NEGATIVE Final    Comment: (NOTE) SARS-CoV-2 target nucleic acids are NOT DETECTED. The SARS-CoV-2 RNA is generally detectable in upper and lower respiratory specimens during the acute phase of infection. Negative results do not preclude SARS-CoV-2 infection, do not rule out co-infections with other pathogens, and should not be used as the sole basis for treatment or other patient management  decisions. Negative results must be combined with clinical observations, patient history, and epidemiological information. The expected result is Negative. Fact Sheet for Patients: HairSlick.no Fact Sheet for Healthcare Providers: quierodirigir.com This test is not yet approved or cleared by the [place] FDA and  has been authorized for detection and/or diagnosis of SARS-CoV-2 by FDA under an Emergency Use Authorization (EUA). This EUA will remain  in effect (meaning this test can be used) for the duration of the COVID-19 declaration under Section 56 4(b)(1) of the Act, 21 U.S.C. section 360bbb-3(b)(1), unless the authorization is terminated or revoked sooner. Performed at [facility], [address] [place], [place] [address]          Radiology Studies: No results found.      Scheduled Meds: . amLODipine  10 mg Oral Daily  . cephALEXin  250 mg Oral Q8H  . enoxaparin (LOVENOX) injection  40 mg Subcutaneous Q24H  . furosemide  40 mg Oral Daily  . gabapentin  400 mg Oral BID  . insulin aspart  0-9 Units Subcutaneous TID WC  . metoprolol succinate  25 mg Oral Daily  . sodium chloride flush  3 mL Intravenous Q12H   Continuous Infusions: . sodium chloride 250 mL (04/13/19 0631)    Assessment & Plan:   Principal Problem:   Acute exacerbation of CHF (congestive heart failure) (HCC) Active Problems:   S/P BKA (below knee amputation) unilateral (HCC)   Type II diabetes mellitus (HCC)   Hyperkalemia   CKD (chronic kidney disease) stage 3, GFR 30-59 ml/min   Leukopenia   History of ITP   Acute diastolic CHF (congestive heart failure) (HCC)  Acute diastolic CHF -Presenting with fluid overload, echocardiogram with preserved EF, unable to assess RV  Clinically has improved, more euvolemic on exam today Plan to transition to Lasix 40 mg p.o. daily from IV bid dosing' Pt has agreed cath in am as she had  previously complained of chest pain and concern for coronary dz as she has RF. Will follow up cardiologist input today   Hyperkalemia:  -Resolved with Lasix, monitor  Urinary tract infection: Ecoli -Urine culture with pansensitive E. Coli -on  Po keflex  Chronic kidney disease stage IIIa: Patient creatinine on admission noted to be 1.33. Appears along patient's baseline which ranges from 1.3-1.6. -Stable, monitor with diuresis  Diabetes mellitus type 2: Home medications include Metformin 500 mg daily.  -CBG stable, hemoglobin A1c is 5.5  Obesity -BMI 40.6  Essential hypertension: Home blood pressure medications include amlodipine 10 mg daily.   History ofITP: Platelet counts currentlywithin normal limits.  -Followed by Dr. Pamelia Hoit of hematology and treated with IVIG -Continue outpatient follow-up  Cataracts: Patient reports that she is in the process of being set up in outpatient setting for removal of the cataracts. -Continue outpatient follow-up, case management assistance  Status post BKA: Patient reports difficulty ambulating due to swelling. Needs home OT -Case management consult, patient has limited social support with bilateral cataracts, BKA and multiple medical problems -We will need close follow-up and home health nursing support  DVT prophylaxis:Lovenox Code Status:Full Family Communication: Discussed patient in detail Disposition Plan: Home in 1-2 days when cardiac w/u completed Barrier: plan for ischemic w/u by cards tomorrow, cath in am      LOS: 4 days   Time spent: 45 min with >50% on coc    Lynn Ito, MD [facility] Pager [phone]  If 7PM-7AM, please contact night-coverage [email or web address] Password [facility] 04/15/2019, 12:17 PM Patient ID: BERTINE SCHLOTTMAN, female   DOB: 08/20/1965, 54 y.o.   MRN: [id number]

## 2019-04-16 LAB — CBC WITH DIFFERENTIAL/PLATELET
Abs Immature Granulocytes: 0.01 10*3/uL (ref 0.00–0.07)
Basophils Absolute: 0 10*3/uL (ref 0.0–0.1)
Basophils Relative: 1 %
Eosinophils Absolute: 0 10*3/uL (ref 0.0–0.5)
Eosinophils Relative: 1 %
HCT: 33.2 % — ABNORMAL LOW (ref 36.0–46.0)
Hemoglobin: 9.8 g/dL — ABNORMAL LOW (ref 12.0–15.0)
Immature Granulocytes: 0 %
Lymphocytes Relative: 21 %
Lymphs Abs: 0.7 10*3/uL (ref 0.7–4.0)
MCH: 26.9 pg (ref 26.0–34.0)
MCHC: 29.5 g/dL — ABNORMAL LOW (ref 30.0–36.0)
MCV: 91.2 fL (ref 80.0–100.0)
Monocytes Absolute: 0.5 10*3/uL (ref 0.1–1.0)
Monocytes Relative: 16 %
Neutro Abs: 1.9 10*3/uL (ref 1.7–7.7)
Neutrophils Relative %: 61 %
Platelets: 124 10*3/uL — ABNORMAL LOW (ref 150–400)
RBC: 3.64 MIL/uL — ABNORMAL LOW (ref 3.87–5.11)
RDW: 17.9 % — ABNORMAL HIGH (ref 11.5–15.5)
WBC: 3.2 10*3/uL — ABNORMAL LOW (ref 4.0–10.5)
nRBC: 0 % (ref 0.0–0.2)

## 2019-04-16 LAB — GLUCOSE, CAPILLARY
Glucose-Capillary: 138 mg/dL — ABNORMAL HIGH (ref 70–99)
Glucose-Capillary: 101 mg/dL — ABNORMAL HIGH (ref 70–99)
Glucose-Capillary: 141 mg/dL — ABNORMAL HIGH (ref 70–99)

## 2019-04-16 LAB — BASIC METABOLIC PANEL
Anion gap: 10 (ref 5–15)
BUN: 43 mg/dL — ABNORMAL HIGH (ref 6–20)
CO2: 26 mmol/L (ref 22–32)
Calcium: 8.8 mg/dL — ABNORMAL LOW (ref 8.9–10.3)
Chloride: 99 mmol/L (ref 98–111)
Creatinine, Ser: 1.7 mg/dL — ABNORMAL HIGH (ref 0.44–1.00)
GFR calc Af Amer: 39 mL/min — ABNORMAL LOW (ref >60)
GFR calc non Af Amer: 34 mL/min — ABNORMAL LOW (ref >60)
Glucose, Bld: 144 mg/dL — ABNORMAL HIGH (ref 70–99)
Potassium: 4.1 mmol/L (ref 3.5–5.1)
Sodium: 135 mmol/L (ref 135–145)

## 2019-04-16 MED ORDER — FUROSEMIDE 10 MG/ML IJ SOLN
40.0000 mg | Freq: Two times a day (BID) | INTRAMUSCULAR | Status: DC
  Administered 2019-04-16 – 2019-04-19 (×6): 40 mg via INTRAVENOUS
  Filled 2019-04-16 (×6): qty 4

## 2019-04-16 MED ORDER — POLYETHYLENE GLYCOL 3350 17 G PO PACK
17.0000 g | PACK | Freq: Once | ORAL | Status: AC
  Administered 2019-04-16: 17 g via ORAL
  Filled 2019-04-16: qty 1

## 2019-04-16 NOTE — Progress Notes (Signed)
PROGRESS NOTE    [name]  CHE:[id number] DOB: 01-May-1965 DOA: 04/10/2019 PCP: Patient, No Pcp Per    Brief Narrative:  [name] a 54 y.o.femalewith medical history significant ofhypertension, DM type II, anemia,ITP, s/pright BKA presented with complaints ofleft leg pain and swelling over the last 3 weeks. Swelling actually goes up into her abdomen.  - Evaluated on 3/16 in the emergency department for the same. It appears BNP was elevated at 627.6 at that time and she was given 80 mg of Lasix along with a short prescription for Lasix 20 mg daily.  -She tried to follow-up with her primary care provider but had not been able to get an appointment yet. Due to worsening symptoms she came to the hospital for further evaluation.  -In the emergency room she was noted to have creatinine of 1.3, BNP of 912, chest x-ray noted cardiomegaly and vascular congestion, urinalysis was abnormal  Assessment & Plan:   Principal Problem:   Acute exacerbation of CHF (congestive heart failure) (HCC) Active Problems:   S/P BKA (below knee amputation) unilateral (HCC)   Type II diabetes mellitus (HCC)   Hyperkalemia   CKD (chronic kidney disease) stage 3, GFR 30-59 ml/min   Leukopenia   History of ITP   Acute diastolic CHF (congestive heart failure) (HCC)   Acute diastolic CHF -Presenting with fluid overload, echocardiogram with preserved EF, unable to assess RV  Clinically has improved, more euvolemic on exam today.  On Lasix IV 40 twice daily.  Cardiology on board.  There was plan for cardiac catheterization today however patient has changed her mind and she does not feel ready for that.  Cardiology is planning to do nuclear stress test in 1 to 2 days once optimized.  Appreciate cardiology help.  Hyperkalemia: Resolved.  Urinary tract infection: Ecoli -Urine culture with pansensitive E. Coli -on  Po keflex  Chronic kidney disease stage IIIa: Patient creatinine on  admission noted to be 1.33. Appears along patient's baseline which ranges from 1.3-1.6. -Stable, monitor with diuresis  Diabetes mellitus type 2: Home medications include Metformin 500 mg daily.  -CBG stable, hemoglobin A1c is 5.5  Obesity -BMI 40.6  Essential hypertension: Controlled.  Continue Home blood pressure medications include amlodipine 10 mg daily.  History ofITP: Platelet counts currentlywithin normal limits.  -Followed by Dr. Lindi Adie of hematology and treated with IVIG.  Comment from oncology appreciated.  She is cleared for cardiac cath and for antiplatelet if needed. -Continue outpatient follow-up  Cataracts: Patient reports that she is in the process of being set up in outpatient setting for removal of the cataracts. -Continue outpatient follow-up, case management assistance  Status post BKA: Patient reports difficulty ambulating due to swelling. Needs home OT -Case management consult, patient has limited social support with bilateral cataracts, BKA and multiple medical problems -We will need close follow-up and home health nursing support  DVT prophylaxis:Lovenox Code Status:Full Family Communication: Discussed patient in detail Disposition Plan: Home in 1-2 days when cardiac w/u completed Barrier: Needs more IV diuresis and nuclear stress test  Status is: Inpatient  Remains inpatient appropriate because:Ongoing diagnostic testing needed not appropriate for outpatient work up   Dispo: The patient is from: Home              Anticipated d/c is to: Home              Anticipated d/c date is: 1 day  Patient currently is not medically stable to d/c.         Consultants:   Cardiology  Procedures:   Antimicrobials:       Subjective: Seen and examined.  No shortness of breath or chest pain but just " does not feel well".  She is anxious about cardiac catheterization.  Wants to talk to cardiology before any  procedure.  Objective: Vitals:   04/15/19 1341 04/15/19 1912 04/16/19 0443 04/16/19 0828  BP: 136/89 (!) 141/69 (!) 141/72 (!) 145/70  Pulse: 77 77 78 76  Resp:  18 18   Temp:  97.9 F (36.6 C) 98.9 F (37.2 C)   TempSrc:  Oral Oral   SpO2:  95% 92% 99%  Weight:   109.5 kg   Height:        Intake/Output Summary (Last 24 hours) at 04/16/2019 1333 Last data filed at 04/16/2019 0859 Gross per 24 hour  Intake 530 ml  Output 1100 ml  Net -570 ml   Filed Weights   04/14/19 0111 04/15/19 0416 04/16/19 0443  Weight: 109.2 kg 109.1 kg 109.5 kg    Examination:  General exam: Appears calm and comfortable  Respiratory system: Clear to auscultation. Respiratory effort normal. Cardiovascular system: S1 & S2 heard, RRR. No JVD, murmurs, rubs, gallops or clicks.  +1 pitting edema left lower extremity Gastrointestinal system: Abdomen is nondistended, soft and nontender. No organomegaly or masses felt. Normal bowel sounds heard. Central nervous system: Alert and oriented. No focal neurological deficits. Extremities: Symmetric 5 x 5 power.  Right AKA Skin: No rashes, lesions or ulcers.  Psychiatry: Judgement and insight appear normal. Mood & affect appropriate.   Data Reviewed: I have personally reviewed following labs and imaging studies  CBC: Recent Labs  Lab 04/10/19 1610 04/12/19 0517 04/15/19 1406 04/16/19 0827  WBC 3.9* 3.0* 3.2* 3.2*  NEUTROABS  --  1.8  --  1.9  HGB 9.4* 9.2* 10.3* 9.8*  HCT 32.7* 31.0* 34.6* 33.2*  MCV 93.7 92.0 89.4 91.2  PLT 189 208 191 124*   Basic Metabolic Panel: Recent Labs  Lab 04/12/19 0517 04/13/19 0437 04/14/19 0426 04/15/19 0418 04/16/19 0542  NA 135 135 136 136 135  K 4.7 4.5 4.1 4.3 4.1  CL 104 100 99 98 99  CO2 24 25 27 26 26   GLUCOSE 89 95 106* 151* 144*  BUN 24* 27* 29* 36* 43*  CREATININE 1.39* 1.45* 1.41* 1.45* 1.70*  CALCIUM 8.4* 8.8* 8.8* 8.6* 8.8*   GFR: Estimated Creatinine Clearance: 48.5 mL/min (A) (by C-G formula  based on SCr of 1.7 mg/dL (H)). Liver Function Tests: Recent Labs  Lab 04/11/19 0351  AST 52*  ALT 8  ALKPHOS 62  BILITOT 0.7  PROT 9.7*  ALBUMIN 3.2*   No results for input(s): LIPASE, AMYLASE in the last 168 hours. No results for input(s): AMMONIA in the last 168 hours. Coagulation Profile: No results for input(s): INR, PROTIME in the last 168 hours. Cardiac Enzymes: No results for input(s): CKTOTAL, CKMB, CKMBINDEX, TROPONINI in the last 168 hours. BNP (last 3 results) No results for input(s): PROBNP in the last 8760 hours. HbA1C: No results for input(s): HGBA1C in the last 72 hours. CBG: Recent Labs  Lab 04/15/19 1201 04/15/19 1617 04/15/19 2118 04/16/19 0615 04/16/19 1108  GLUCAP 124* 136* 126* 138* 101*   Lipid Profile: No results for input(s): CHOL, HDL, LDLCALC, TRIG, CHOLHDL, LDLDIRECT in the last 72 hours. Thyroid Function Tests: No results for input(s): TSH, T4TOTAL,  FREET4, T3FREE, THYROIDAB in the last 72 hours. Anemia Panel: No results for input(s): VITAMINB12, FOLATE, FERRITIN, TIBC, IRON, RETICCTPCT in the last 72 hours. Sepsis Labs: No results for input(s): PROCALCITON, LATICACIDVEN in the last 168 hours.  Recent Results (from the past 240 hour(s))  Urine culture     Status: Abnormal   Collection Time: 04/11/19  6:08 AM   Specimen: Urine, Random  Result Value Ref Range Status   Specimen Description URINE, RANDOM  Final   Special Requests NONE  Final   Culture (A)  Final    >=100,000 COLONIES/mL ESCHERICHIA COLI SUSCEPTIBILITIES TO FOLLOW Performed at [facility], [address] [place], [place] [address]    Report Status 04/13/2019 FINAL  Final   Organism ID, Bacteria ESCHERICHIA COLI (A)  Final      Susceptibility   Escherichia coli - MIC*    AMPICILLIN <=2 SENSITIVE Sensitive     CEFAZOLIN <=4 SENSITIVE Sensitive     CEFTRIAXONE <=0.25 SENSITIVE Sensitive     CIPROFLOXACIN <=0.25 SENSITIVE Sensitive     GENTAMICIN <=1 SENSITIVE  Sensitive     IMIPENEM <=0.25 SENSITIVE Sensitive     NITROFURANTOIN <=16 SENSITIVE Sensitive     TRIMETH/SULFA <=20 SENSITIVE Sensitive     AMPICILLIN/SULBACTAM <=2 SENSITIVE Sensitive     PIP/TAZO <=4 SENSITIVE Sensitive     * >=100,000 COLONIES/mL ESCHERICHIA COLI  SARS CORONAVIRUS 2 (TAT 6-24 HRS) Nasopharyngeal Nasopharyngeal Swab     Status: None   Collection Time: 04/11/19  6:26 AM   Specimen: Nasopharyngeal Swab  Result Value Ref Range Status   SARS Coronavirus 2 NEGATIVE NEGATIVE Final    Comment: (NOTE) SARS-CoV-2 target nucleic acids are NOT DETECTED. The SARS-CoV-2 RNA is generally detectable in upper and lower respiratory specimens during the acute phase of infection. Negative results do not preclude SARS-CoV-2 infection, do not rule out co-infections with other pathogens, and should not be used as the sole basis for treatment or other patient management decisions. Negative results must be combined with clinical observations, patient history, and epidemiological information. The expected result is Negative. Fact Sheet for Patients: HairSlick.no Fact Sheet for Healthcare Providers: quierodirigir.com This test is not yet approved or cleared by the [place] FDA and  has been authorized for detection and/or diagnosis of SARS-CoV-2 by FDA under an Emergency Use Authorization (EUA). This EUA will remain  in effect (meaning this test can be used) for the duration of the COVID-19 declaration under Section 56 4(b)(1) of the Act, 21 U.S.C. section 360bbb-3(b)(1), unless the authorization is terminated or revoked sooner. Performed at [facility], [address] [place], [place] [address]          Radiology Studies: No results found.      Scheduled Meds: . amLODipine  10 mg Oral Daily  . cephALEXin  250 mg Oral Q8H  . enoxaparin (LOVENOX) injection  40 mg Subcutaneous Q24H  . furosemide  40 mg  Intravenous BID  . gabapentin  400 mg Oral BID  . insulin aspart  0-9 Units Subcutaneous TID WC  . metoprolol succinate  50 mg Oral Daily  . sodium chloride flush  3 mL Intravenous Q12H   Continuous Infusions: . sodium chloride 250 mL (04/13/19 0631)        LOS: 5 days   Time spent: 35 min with >50% on coc  Hughie Closs, MD [facility]  If 7PM-7AM, please contact night-coverage [email or web address] 04/16/2019, 1:33 PM Patient ID: [name], female  DOB: Jan 22, 1965, 54 y.o.   MRN: [id number]

## 2019-04-16 NOTE — Progress Notes (Signed)
Progress Note  Patient Name: [name] Date of Encounter: 04/16/2019  Primary Cardiologist: Dr Margaretann Loveless  Subjective   No chest pain; dyspnea improving  Inpatient Medications    Scheduled Meds: . amLODipine  10 mg Oral Daily  . cephALEXin  250 mg Oral Q8H  . enoxaparin (LOVENOX) injection  40 mg Subcutaneous Q24H  . furosemide  40 mg Oral Daily  . gabapentin  400 mg Oral BID  . insulin aspart  0-9 Units Subcutaneous TID WC  . metoprolol succinate  50 mg Oral Daily  . sodium chloride flush  3 mL Intravenous Q12H   Continuous Infusions: . sodium chloride 250 mL (04/13/19 0631)   PRN Meds: sodium chloride, acetaminophen, aspirin EC, gabapentin, HYDROcodone-acetaminophen, ondansetron (ZOFRAN) IV, sodium chloride flush   Vital Signs    Vitals:   04/15/19 1341 04/15/19 1912 04/16/19 0443 04/16/19 0828  BP: 136/89 (!) 141/69 (!) 141/72 (!) 145/70  Pulse: 77 77 78 76  Resp:  18 18   Temp:  97.9 F (36.6 C) 98.9 F (37.2 C)   TempSrc:  Oral Oral   SpO2:  95% 92% 99%  Weight:   109.5 kg   Height:        Intake/Output Summary (Last 24 hours) at 04/16/2019 1213 Last data filed at 04/16/2019 0859 Gross per 24 hour  Intake 770 ml  Output 1700 ml  Net -930 ml   Last 3 Weights 04/16/2019 04/15/2019 04/14/2019  Weight (lbs) 241 lb 6.5 oz 240 lb 8.4 oz 240 lb 11.9 oz  Weight (kg) 109.5 kg 109.1 kg 109.2 kg      Telemetry    Sinus- Personally Reviewed  Physical Exam   GEN: No acute distress.   Neck: No JVD Cardiac: RRR, no murmurs, rubs, or gallops.  Respiratory: Clear to auscultation bilaterally. GI: Soft, nontender, non-distended  MS: 1+ thigh edema; s/p right BKA Neuro:  Nonfocal  Psych: Normal affect   Labs     Chemistry Recent Labs  Lab 04/11/19 0351 04/11/19 1203 04/14/19 0426 04/15/19 0418 04/16/19 0542  NA  --    < > 136 136 135  K  --    < > 4.1 4.3 4.1  CL  --    < > 99 98 99  CO2  --    < > 27 26 26   GLUCOSE  --    < > 106* 151* 144*    BUN  --    < > 29* 36* 43*  CREATININE  --    < > 1.41* 1.45* 1.70*  CALCIUM  --    < > 8.8* 8.6* 8.8*  PROT 9.7*  --   --   --   --   ALBUMIN 3.2*  --   --   --   --   AST 52*  --   --   --   --   ALT 8  --   --   --   --   ALKPHOS 62  --   --   --   --   BILITOT 0.7  --   --   --   --   GFRNONAA  --    < > 43* 41* 34*  GFRAA  --    < > 50* 48* 39*  ANIONGAP  --    < > 10 12 10    < > = values in this interval not displayed.     Hematology Recent Labs  Lab 04/12/19 [phone]  04/15/19 1406 04/16/19 0827  WBC 3.0* 3.2* 3.2*  RBC 3.37* 3.87 3.64*  HGB 9.2* 10.3* 9.8*  HCT 31.0* 34.6* 33.2*  MCV 92.0 89.4 91.2  MCH 27.3 26.6 26.9  MCHC 29.7* 29.8* 29.5*  RDW 18.6* 17.9* 17.9*  PLT 208 191 124*    BNP Recent Labs  Lab 04/11/19 0351 04/14/19 0842 04/15/19 0418  BNP 912.4* 452.2* 415.0*     Patient Profile     54 y.o. female with past medical history of diabetes mellitus, hypertension, ITP, prior right BKA for evaluation of acute diastolic congestive heart failure.  Echocardiogram this admission shows normal LV function, moderate left ventricular hypertrophy.  Assessment & Plan    1 acute diastolic congestive heart failure-I/O-9944 since admission; wt 109.5 kg.  Patient remains volume overloaded.  Resume Lasix 40 mg IV twice daily and follow renal function closely.  Needs fluid restriction and low-sodium diet.  2 history of chest pain-there was discussion about cardiac catheterization.  However the patient declines today stating "I am not ready for that".  She would also be at risk for contrast nephropathy.  Best option may be stress nuclear study in the next 24 to 48 hours for risk stratification.  Hopefully she will be agreeable to this.  3 acute on chronic stage III kidney disease-follow renal function closely with diuresis.  4 hypertension-continue present medications and follow.  For questions or updates, please contact [facility] Please consult [email or web address]  for contact info under        Signed, Olga Millers, MD  04/16/2019, 12:13 PM

## 2019-04-16 NOTE — Progress Notes (Signed)
Physical Therapy Treatment Patient Details Name: [name] MRN: [id number] DOB: May 30, 1965 Today's Date: 04/16/2019    History of Present Illness 54 yo admitted with bil LE edema and CHF. PMHx: Rt BKA, Dm, HTN, neuropathy    PT Comments    Pt initially reluctant to participate in therapy as she is NPO for possible cardiology procedure. Once pt starts with therapy she is pleasantly surprised by her level of function. Pt is currently mod I for bed mobility, min guard for transfers an min guard for hopping gait 2 feet forward and backwards with RW for 2 bouts. Pt also able to stand in RW for approximately 10 minutes. D/c plans remain appropriate. PT will continue to follow acutely.      Follow Up Recommendations  Supervision/Assistance - 24 hour;SNF(pt reports she will decline SNF or ALF but due to cognition needs additional support. Is [facility] home first an option if she refuses?)     Equipment Recommendations  None recommended by PT       Precautions / Restrictions Precautions Precautions: Fall Precaution Comments: pt with very low vision and reports progressive decline in vision for 2 years. Has a prosthesis but never used it Restrictions Weight Bearing Restrictions: No    Mobility  Bed Mobility Overal bed mobility: Needs Assistance Bed Mobility: Supine to Sit     Supine to sit: Modified independent (Device/Increase time);HOB elevated     General bed mobility comments: mod I for coming to sitting EoB and for return to supine at end of session   Transfers Overall transfer level: Needs assistance Equipment used: Rolling walker (2 wheeled) Transfers: Sit to/from [employer] Sit to Stand: Min guard   Squat pivot transfers: Min guard     General transfer comment: min guard for sit<>standx 5, which caused need for BM able to take hopping steps with  RW over to [facility] on her L and back to bed afterward  Ambulation/Gait Ambulation/Gait assistance: Min  guard Gait Distance (Feet): 2 Feet(2 feet forward and back x 2 ) Assistive device: Rolling walker (2 wheeled) Gait Pattern/deviations: (hop to pattern with RW) Gait velocity: slowed Gait velocity interpretation: <1.31 ft/sec, indicative of household ambulator General Gait Details: reports she used to be able to hop to and from bathroom and would like to be strong enough to do again.          Balance Overall balance assessment: Needs assistance   Sitting balance-Leahy Scale: Fair     Standing balance support: Bilateral upper extremity supported Standing balance-Leahy Scale: Poor Standing balance comment: able to stand for 5 minutes for pericare and additonally 10 min with hopping                             Cognition Arousal/Alertness: Awake/alert Behavior During Therapy: WFL for tasks assessed/performed Overall Cognitive Status: Impaired/Different from baseline Area of Impairment: Memory;Safety/judgement;Problem solving                     Memory: Decreased short-term memory   Safety/Judgement: Decreased awareness of safety;Decreased awareness of deficits   Problem Solving: Slow processing General Comments: pt continues to have slow processing and decreased understanding of deficits and there cause          General Comments General comments (skin integrity, edema, etc.): VSS on RA       Pertinent Vitals/Pain Pain Assessment: Faces Pain Location: bil LE with palpation  Pain Descriptors /  Indicators: Aching;Sore Pain Intervention(s): Limited activity within patient's tolerance;Monitored during session;Repositioned           PT Goals (current goals can now be found in the care plan section) Acute Rehab PT Goals Patient Stated Goal: return to my house PT Goal Formulation: With patient Time For Goal Achievement: 04/26/19 Potential to Achieve Goals: Fair Progress towards PT goals: Progressing toward goals    Frequency    Min 3X/week       PT Plan Current plan remains appropriate    Co-evaluation              AM-PAC PT "6 Clicks" Mobility   Outcome Measure  Help needed turning from your back to your side while in a flat bed without using bedrails?: A Little Help needed moving from lying on your back to sitting on the side of a flat bed without using bedrails?: A Little Help needed moving to and from a bed to a chair (including a wheelchair)?: A Little Help needed standing up from a chair using your arms (e.g., wheelchair or bedside chair)?: A Little Help needed to walk in hospital room?: Total Help needed climbing 3-5 steps with a railing? : Total 6 Click Score: 14    End of Session Equipment Utilized During Treatment: Gait belt Activity Tolerance: Patient tolerated treatment well Patient left: in chair;with call bell/phone within reach;with chair alarm set Nurse Communication: Mobility status;Precautions PT Visit Diagnosis: Other abnormalities of gait and mobility (R26.89);Muscle weakness (generalized) (M62.81)     Time: 0768-0881 PT Time Calculation (min) (ACUTE ONLY): 44 min  Charges:  $Gait Training: 8-22 mins $Therapeutic Exercise: 8-22 mins $Therapeutic Activity: 8-22 mins                     Derrica Sieg B. Beverely Risen PT, DPT Acute Rehabilitation Services Pager [phone] Office [phone]    Elon Alas Fleet 04/16/2019, 1:01 PM

## 2019-04-17 ENCOUNTER — Inpatient Hospital Stay (HOSPITAL_COMMUNITY): Payer: Medicare Other

## 2019-04-17 DIAGNOSIS — I5033 Acute on chronic diastolic (congestive) heart failure: Secondary | ICD-10-CM | POA: Diagnosis not present

## 2019-04-17 DIAGNOSIS — R079 Chest pain, unspecified: Secondary | ICD-10-CM | POA: Diagnosis not present

## 2019-04-17 LAB — BASIC METABOLIC PANEL
Anion gap: 9 (ref 5–15)
BUN: 46 mg/dL — ABNORMAL HIGH (ref 6–20)
CO2: 25 mmol/L (ref 22–32)
Calcium: 8.5 mg/dL — ABNORMAL LOW (ref 8.9–10.3)
Chloride: 101 mmol/L (ref 98–111)
Creatinine, Ser: 1.5 mg/dL — ABNORMAL HIGH (ref 0.44–1.00)
GFR calc Af Amer: 46 mL/min — ABNORMAL LOW (ref >60)
GFR calc non Af Amer: 40 mL/min — ABNORMAL LOW (ref >60)
Glucose, Bld: 110 mg/dL — ABNORMAL HIGH (ref 70–99)
Potassium: 4.1 mmol/L (ref 3.5–5.1)
Sodium: 135 mmol/L (ref 135–145)

## 2019-04-17 LAB — GLUCOSE, CAPILLARY
Glucose-Capillary: 114 mg/dL — ABNORMAL HIGH (ref 70–99)
Glucose-Capillary: 141 mg/dL — ABNORMAL HIGH (ref 70–99)
Glucose-Capillary: 104 mg/dL — ABNORMAL HIGH (ref 70–99)
Glucose-Capillary: 150 mg/dL — ABNORMAL HIGH (ref 70–99)

## 2019-04-17 MED ORDER — TECHNETIUM TC 99M TETROFOSMIN IV KIT
30.2000 | PACK | Freq: Once | INTRAVENOUS | Status: AC | PRN
  Administered 2019-04-17: 13:00:00 30.2 via INTRAVENOUS

## 2019-04-17 MED ORDER — ROSUVASTATIN CALCIUM 20 MG PO TABS
20.0000 mg | ORAL_TABLET | Freq: Every day | ORAL | Status: DC
  Administered 2019-04-17 – 2019-04-19 (×3): 20 mg via ORAL
  Filled 2019-04-17 (×3): qty 1

## 2019-04-17 NOTE — Progress Notes (Signed)
PROGRESS NOTE    MAKAILEE NUDELMAN  QQI:[id number] DOB: 08/31/1965 DOA: 04/10/2019 PCP: Patient, No Pcp Per    Brief Narrative:  [name] a 54 y.o.femalewith medical history significant ofhypertension, DM type II, anemia,ITP, s/pright BKA presented with complaints ofleft leg pain and swelling over the last 3 weeks. Swelling actually goes up into her abdomen.  - Evaluated on 3/16 in the emergency department for the same. It appears BNP was elevated at 627.6 at that time and she was given 80 mg of Lasix along with a short prescription for Lasix 20 mg daily.  -She tried to follow-up with her primary care provider but had not been able to get an appointment yet. Due to worsening symptoms she came to the hospital for further evaluation.  -In the emergency room she was noted to have creatinine of 1.3, BNP of 912, chest x-ray noted cardiomegaly and vascular congestion, urinalysis was abnormal  Assessment & Plan:   Principal Problem:   Acute exacerbation of CHF (congestive heart failure) (HCC) Active Problems:   S/P BKA (below knee amputation) unilateral (HCC)   Type II diabetes mellitus (HCC)   Hyperkalemia   CKD (chronic kidney disease) stage 3, GFR 30-59 ml/min   Leukopenia   History of ITP   Acute diastolic CHF (congestive heart failure) (HCC)  Acute diastolic CHF -Presenting with fluid overload, echocardiogram with preserved EF, unable to assess RV  Clinically has improved, more euvolemic on exam today.  On Lasix IV 40 twice daily.  Cardiology on board.  There was plan for cardiac catheterization yesterday however patient had changed her mind and she does not feel ready for that.  She is ready for nuclear stress test.  Cardiology plans to do that tomorrow.  Based on that, they will have further discussion about cardiac catheterization with the patient if needed.  Hyperkalemia: Resolved.  Urinary tract infection: Ecoli -Urine culture with pansensitive E.  Coli -Has received total 7 days of antibiotics initially IV Rocephin and then p.o. Keflex.  Will discontinue that.  Chronic kidney disease stage IIIa: Patient creatinine on admission noted to be 1.33. Appears along patient's baseline which ranges from 1.3-1.6. -Stable, monitor with diuresis  Diabetes mellitus type 2: Home medications include Metformin 500 mg daily.  -CBG stable, hemoglobin A1c is 5.5.  Hold Metformin.  Continue SSI.  Obesity -BMI 40.6  Essential hypertension: Controlled.  Continue Home blood pressure medications include amlodipine 10 mg daily and Toprol-XL 50 mg.  History ofITP: Platelet counts currentlywithin normal limits.  -Followed by Dr. Lindi Adie of hematology and treated with IVIG.  Comment from oncology appreciated.  She is cleared for cardiac cath and for antiplatelet if needed. -Continue outpatient follow-up  Cataracts: Patient reports that she is in the process of being set up in outpatient setting for removal of the cataracts. -Continue outpatient follow-up, case management assistance  Status post BKA: Patient reports difficulty ambulating due to swelling. Needs home OT -Case management consult, patient has limited social support with bilateral cataracts, BKA and multiple medical problems -We will need close follow-up and home health nursing support  DVT prophylaxis:Lovenox Code Status:Full Family Communication: Discussed with patient in detail.  No family present. Disposition Plan: Home in 1-2 days when cardiac w/u completed Barrier: Needs more IV diuresis and nuclear stress test  Status is: Inpatient  Remains inpatient appropriate because:Ongoing diagnostic testing needed not appropriate for outpatient work up   Dispo: The patient is from: Home  Anticipated d/c is to: Home              Anticipated d/c date is:1 to 2 days              Patient currently is not medically stable to d/c.         Consultants:    Cardiology  Procedures:   Antimicrobials:   Rocephin for 04/11/19> 04/13/2019  Keflex 04/13/19> 04/17/2019   Subjective: Seen and examined.  Feels much better.  No complaints.  No shortness of breath or chest pain.  Objective: Vitals:   04/16/19 1306 04/16/19 1935 04/17/19 0415 04/17/19 1227  BP: (!) 144/61 139/71 (!) 144/69 (!) 146/59  Pulse: 71 73 69 70  Resp: 18 18 18 18   Temp: 98.4 F (36.9 C) 98.2 F (36.8 C) 97.9 F (36.6 C) 98.1 F (36.7 C)  TempSrc: Oral Oral Oral Oral  SpO2: 95% 94% 92% 99%  Weight:   108.3 kg   Height:        Intake/Output Summary (Last 24 hours) at 04/17/2019 1345 Last data filed at 04/17/2019 0945 Gross per 24 hour  Intake 720 ml  Output 700 ml  Net 20 ml   Filed Weights   04/15/19 0416 04/16/19 0443 04/17/19 0415  Weight: 109.1 kg 109.5 kg 108.3 kg    Examination:  General exam: Appears calm and comfortable  Respiratory system: Fine bibasilar crackles. Respiratory effort normal. Cardiovascular system: S1 & S2 heard, RRR. No JVD, murmurs, rubs, gallops or clicks.  Trace pitting edema left lower extremity. Gastrointestinal system: Abdomen is nondistended, soft and nontender. No organomegaly or masses felt. Normal bowel sounds heard. Central nervous system: Alert and oriented. No focal neurological deficits. Extremities: Right BKA Skin: No rashes, lesions or ulcers.  Psychiatry: Judgement and insight appear normal. Mood & affect appropriate.    Data Reviewed: I have personally reviewed following labs and imaging studies  CBC: Recent Labs  Lab 04/10/19 1610 04/12/19 0517 04/15/19 1406 04/16/19 0827  WBC 3.9* 3.0* 3.2* 3.2*  NEUTROABS  --  1.8  --  1.9  HGB 9.4* 9.2* 10.3* 9.8*  HCT 32.7* 31.0* 34.6* 33.2*  MCV 93.7 92.0 89.4 91.2  PLT 189 208 191 124*   Basic Metabolic Panel: Recent Labs  Lab 04/13/19 0437 04/14/19 0426 04/15/19 0418 04/16/19 0542 04/17/19 0500  NA 135 136 136 135 135  K 4.5 4.1 4.3 4.1 4.1  CL 100  99 98 99 101  CO2 25 27 26 26 25   GLUCOSE 95 106* 151* 144* 110*  BUN 27* 29* 36* 43* 46*  CREATININE 1.45* 1.41* 1.45* 1.70* 1.50*  CALCIUM 8.8* 8.8* 8.6* 8.8* 8.5*   GFR: Estimated Creatinine Clearance: 54.6 mL/min (A) (by C-G formula based on SCr of 1.5 mg/dL (H)). Liver Function Tests: Recent Labs  Lab 04/11/19 0351  AST 52*  ALT 8  ALKPHOS 62  BILITOT 0.7  PROT 9.7*  ALBUMIN 3.2*   No results for input(s): LIPASE, AMYLASE in the last 168 hours. No results for input(s): AMMONIA in the last 168 hours. Coagulation Profile: No results for input(s): INR, PROTIME in the last 168 hours. Cardiac Enzymes: No results for input(s): CKTOTAL, CKMB, CKMBINDEX, TROPONINI in the last 168 hours. BNP (last 3 results) No results for input(s): PROBNP in the last 8760 hours. HbA1C: No results for input(s): HGBA1C in the last 72 hours. CBG: Recent Labs  Lab 04/16/19 0615 04/16/19 1108 04/16/19 1631 04/17/19 0612 04/17/19 1055  GLUCAP 138* 101* 141* 114*  141*   Lipid Profile: No results for input(s): CHOL, HDL, LDLCALC, TRIG, CHOLHDL, LDLDIRECT in the last 72 hours. Thyroid Function Tests: No results for input(s): TSH, T4TOTAL, FREET4, T3FREE, THYROIDAB in the last 72 hours. Anemia Panel: No results for input(s): VITAMINB12, FOLATE, FERRITIN, TIBC, IRON, RETICCTPCT in the last 72 hours. Sepsis Labs: No results for input(s): PROCALCITON, LATICACIDVEN in the last 168 hours.  Recent Results (from the past 240 hour(s))  Urine culture     Status: Abnormal   Collection Time: 04/11/19  6:08 AM   Specimen: Urine, Random  Result Value Ref Range Status   Specimen Description URINE, RANDOM  Final   Special Requests NONE  Final   Culture (A)  Final    >=100,000 COLONIES/mL ESCHERICHIA COLI SUSCEPTIBILITIES TO FOLLOW Performed at [facility], [address] [place], [place] [address]    Report Status 04/13/2019 FINAL  Final   Organism ID, Bacteria ESCHERICHIA COLI (A)  Final       Susceptibility   Escherichia coli - MIC*    AMPICILLIN <=2 SENSITIVE Sensitive     CEFAZOLIN <=4 SENSITIVE Sensitive     CEFTRIAXONE <=0.25 SENSITIVE Sensitive     CIPROFLOXACIN <=0.25 SENSITIVE Sensitive     GENTAMICIN <=1 SENSITIVE Sensitive     IMIPENEM <=0.25 SENSITIVE Sensitive     NITROFURANTOIN <=16 SENSITIVE Sensitive     TRIMETH/SULFA <=20 SENSITIVE Sensitive     AMPICILLIN/SULBACTAM <=2 SENSITIVE Sensitive     PIP/TAZO <=4 SENSITIVE Sensitive     * >=100,000 COLONIES/mL ESCHERICHIA COLI  SARS CORONAVIRUS 2 (TAT 6-24 HRS) Nasopharyngeal Nasopharyngeal Swab     Status: None   Collection Time: 04/11/19  6:26 AM   Specimen: Nasopharyngeal Swab  Result Value Ref Range Status   SARS Coronavirus 2 NEGATIVE NEGATIVE Final    Comment: (NOTE) SARS-CoV-2 target nucleic acids are NOT DETECTED. The SARS-CoV-2 RNA is generally detectable in upper and lower respiratory specimens during the acute phase of infection. Negative results do not preclude SARS-CoV-2 infection, do not rule out co-infections with other pathogens, and should not be used as the sole basis for treatment or other patient management decisions. Negative results must be combined with clinical observations, patient history, and epidemiological information. The expected result is Negative. Fact Sheet for Patients: HairSlick.no Fact Sheet for Healthcare Providers: quierodirigir.com This test is not yet approved or cleared by the [place] FDA and  has been authorized for detection and/or diagnosis of SARS-CoV-2 by FDA under an Emergency Use Authorization (EUA). This EUA will remain  in effect (meaning this test can be used) for the duration of the COVID-19 declaration under Section 56 4(b)(1) of the Act, 21 U.S.C. section 360bbb-3(b)(1), unless the authorization is terminated or revoked sooner. Performed at [facility], [address], [place],  [place] [address]          Radiology Studies: No results found.      Scheduled Meds: . amLODipine  10 mg Oral Daily  . cephALEXin  250 mg Oral Q8H  . enoxaparin (LOVENOX) injection  40 mg Subcutaneous Q24H  . furosemide  40 mg Intravenous BID  . gabapentin  400 mg Oral BID  . insulin aspart  0-9 Units Subcutaneous TID WC  . metoprolol succinate  50 mg Oral Daily  . rosuvastatin  20 mg Oral Daily  . sodium chloride flush  3 mL Intravenous Q12H   Continuous Infusions: . sodium chloride 250 mL (04/13/19 0631)  LOS: 6 days   Time spent: 29 min with >50% on coc  Hughie Closs, MD [facility]  If 7PM-7AM, please contact night-coverage [email or web address] 04/17/2019, 1:45 PM Patient ID: JULYSSA KYER, female   DOB: 05-19-1965, 54 y.o.   MRN: [id number]

## 2019-04-17 NOTE — Progress Notes (Signed)
Progress Note  Patient Name: [name] Date of Encounter: 04/17/2019  Primary Cardiologist: Dr Jacques Navy  Subjective   Denies CP or dyspnea  Inpatient Medications    Scheduled Meds: . amLODipine  10 mg Oral Daily  . cephALEXin  250 mg Oral Q8H  . enoxaparin (LOVENOX) injection  40 mg Subcutaneous Q24H  . furosemide  40 mg Intravenous BID  . gabapentin  400 mg Oral BID  . insulin aspart  0-9 Units Subcutaneous TID WC  . metoprolol succinate  50 mg Oral Daily  . sodium chloride flush  3 mL Intravenous Q12H   Continuous Infusions: . sodium chloride 250 mL (04/13/19 0631)   PRN Meds: sodium chloride, acetaminophen, aspirin EC, gabapentin, HYDROcodone-acetaminophen, ondansetron (ZOFRAN) IV, sodium chloride flush   Vital Signs    Vitals:   04/16/19 0443 04/16/19 0828 04/16/19 1935 04/17/19 0415  BP: (!) 141/72 (!) 145/70 139/71 (!) 144/69  Pulse: 78 76 73 69  Resp: 18  18 18   Temp: 98.9 F (37.2 C)  98.2 F (36.8 C) 97.9 F (36.6 C)  TempSrc: Oral  Oral Oral  SpO2: 92% 99% 94% 92%  Weight: 109.5 kg   108.3 kg  Height:        Intake/Output Summary (Last 24 hours) at 04/17/2019 0924 Last data filed at 04/17/2019 0835 Gross per 24 hour  Intake 480 ml  Output 700 ml  Net -220 ml   Last 3 Weights 04/17/2019 04/16/2019 04/15/2019  Weight (lbs) 238 lb 12.1 oz 241 lb 6.5 oz 240 lb 8.4 oz  Weight (kg) 108.3 kg 109.5 kg 109.1 kg      Telemetry    Sinus- Personally Reviewed  Physical Exam   GEN: WD, NAD Neck: supple Cardiac: RRR Respiratory: CTA GI: Soft, NT ND MS: 1+ thigh edema; s/p right BKA Neuro:  Grossly intact Psych: Normal affect   Labs     Chemistry Recent Labs  Lab 04/11/19 0351 04/11/19 1203 04/15/19 0418 04/16/19 0542 04/17/19 0500  NA  --    < > 136 135 135  K  --    < > 4.3 4.1 4.1  CL  --    < > 98 99 101  CO2  --    < > 26 26 25   GLUCOSE  --    < > 151* 144* 110*  BUN  --    < > 36* 43* 46*  CREATININE  --    < > 1.45* 1.70*  1.50*  CALCIUM  --    < > 8.6* 8.8* 8.5*  PROT 9.7*  --   --   --   --   ALBUMIN 3.2*  --   --   --   --   AST 52*  --   --   --   --   ALT 8  --   --   --   --   ALKPHOS 62  --   --   --   --   BILITOT 0.7  --   --   --   --   GFRNONAA  --    < > 41* 34* 40*  GFRAA  --    < > 48* 39* 46*  ANIONGAP  --    < > 12 10 9    < > = values in this interval not displayed.     Hematology Recent Labs  Lab 04/12/19 0517 04/15/19 1406 04/16/19 0827  WBC 3.0* 3.2* 3.2*  RBC 3.37*  3.87 3.64*  HGB 9.2* 10.3* 9.8*  HCT 31.0* 34.6* 33.2*  MCV 92.0 89.4 91.2  MCH 27.3 26.6 26.9  MCHC 29.7* 29.8* 29.5*  RDW 18.6* 17.9* 17.9*  PLT 208 191 124*    BNP Recent Labs  Lab 04/11/19 0351 04/14/19 0842 04/15/19 0418  BNP 912.4* 452.2* 415.0*     Patient Profile     54 y.o. female with past medical history of diabetes mellitus, hypertension, ITP, prior right BKA for evaluation of acute diastolic congestive heart failure.  Echocardiogram this admission shows normal LV function, moderate left ventricular hypertrophy.  Assessment & Plan    1 acute diastolic congestive heart failure-I/O-10404 since admission; wt 108.3 kg.  Still with volume in upper thighs and hips.  We will continue Lasix at present dose.  Follow renal function closely.  Needs fluid restriction and low-sodium diet.  2 history of chest pain-I again discussed options with patient today.  She does not want to proceed with cardiac catheterization at this point.  However she is agreeable to a [place] nuclear study for risk stratification.  If normal plan medical therapy.  If abnormal she would consider cardiac catheterization although not sure.  We will arrange nuclear study tomorrow.  Continue aspirin.  Add statin.  3 acute on chronic stage III kidney disease-follow renal function closely with diuresis.  4 hypertension-continue present medications and follow.  For questions or updates, please contact [place] Please consult  [email or web address] for contact info under        Signed, Kirk Ruths, MD  04/17/2019, 9:24 AM

## 2019-04-17 NOTE — Care Management Important Message (Signed)
Important Message  Patient Details  Name: [name] MRN: [id number] Date of Birth: [birth year]   Medicare Important Message Given:  Yes     Renie Ora 04/17/2019, 9:41 AM

## 2019-04-18 ENCOUNTER — Inpatient Hospital Stay: Payer: Medicare Other

## 2019-04-18 ENCOUNTER — Telehealth: Payer: Self-pay | Admitting: Hematology and Oncology

## 2019-04-18 ENCOUNTER — Inpatient Hospital Stay: Payer: Medicare Other | Admitting: Hematology and Oncology

## 2019-04-18 DIAGNOSIS — E875 Hyperkalemia: Secondary | ICD-10-CM | POA: Diagnosis not present

## 2019-04-18 DIAGNOSIS — E119 Type 2 diabetes mellitus without complications: Secondary | ICD-10-CM

## 2019-04-18 DIAGNOSIS — Z89519 Acquired absence of unspecified leg below knee: Secondary | ICD-10-CM | POA: Diagnosis not present

## 2019-04-18 DIAGNOSIS — Z862 Personal history of diseases of the blood and blood-forming organs and certain disorders involving the immune mechanism: Secondary | ICD-10-CM | POA: Diagnosis not present

## 2019-04-18 DIAGNOSIS — I5031 Acute diastolic (congestive) heart failure: Secondary | ICD-10-CM

## 2019-04-18 DIAGNOSIS — N390 Urinary tract infection, site not specified: Secondary | ICD-10-CM

## 2019-04-18 LAB — BASIC METABOLIC PANEL
Anion gap: 9 (ref 5–15)
BUN: 51 mg/dL — ABNORMAL HIGH (ref 6–20)
CO2: 26 mmol/L (ref 22–32)
Calcium: 8.7 mg/dL — ABNORMAL LOW (ref 8.9–10.3)
Chloride: 99 mmol/L (ref 98–111)
Creatinine, Ser: 1.38 mg/dL — ABNORMAL HIGH (ref 0.44–1.00)
GFR calc Af Amer: 51 mL/min — ABNORMAL LOW (ref >60)
GFR calc non Af Amer: 44 mL/min — ABNORMAL LOW (ref >60)
Glucose, Bld: 142 mg/dL — ABNORMAL HIGH (ref 70–99)
Potassium: 4.2 mmol/L (ref 3.5–5.1)
Sodium: 134 mmol/L — ABNORMAL LOW (ref 135–145)

## 2019-04-18 LAB — NM MYOCAR MULTI W/SPECT W/WALL MOTION / EF
Estimated workload: 1 METS
Exercise duration (min): 5 min
Exercise duration (sec): 14 s
MPHR: 168 {beats}/min
Peak HR: 75 {beats}/min
Percent HR: 44 %
Rest HR: 68 {beats}/min

## 2019-04-18 LAB — GLUCOSE, CAPILLARY
Glucose-Capillary: 125 mg/dL — ABNORMAL HIGH (ref 70–99)
Glucose-Capillary: 130 mg/dL — ABNORMAL HIGH (ref 70–99)
Glucose-Capillary: 203 mg/dL — ABNORMAL HIGH (ref 70–99)

## 2019-04-18 MED ORDER — REGADENOSON 0.4 MG/5ML IV SOLN
INTRAVENOUS | Status: AC
  Administered 2019-04-18: 0.4 mg via INTRAVENOUS
  Filled 2019-04-18: qty 5

## 2019-04-18 MED ORDER — REGADENOSON 0.4 MG/5ML IV SOLN
0.4000 mg | Freq: Once | INTRAVENOUS | Status: AC
  Filled 2019-04-18: qty 5

## 2019-04-18 MED ORDER — TECHNETIUM TC 99M TETROFOSMIN IV KIT
30.0000 | PACK | Freq: Once | INTRAVENOUS | Status: AC | PRN
  Administered 2019-04-18: 30 via INTRAVENOUS

## 2019-04-18 MED ORDER — ISOSORBIDE MONONITRATE ER 30 MG PO TB24
30.0000 mg | ORAL_TABLET | Freq: Every day | ORAL | Status: DC
  Administered 2019-04-18 – 2019-04-19 (×2): 30 mg via ORAL
  Filled 2019-04-18 (×2): qty 1

## 2019-04-18 NOTE — Progress Notes (Signed)
Nuclear stress neg and pt informed

## 2019-04-18 NOTE — Progress Notes (Signed)
PROGRESS NOTE  [name] RCV:[id number] DOB: 04-18-1965 DOA: 04/10/2019 PCP: Patient, No Pcp Per  Brief Narrative:  Cheryl Obey Martinis a 54 y.o.femalewith medical history significant ofhypertension, DM type II, anemia,ITP, s/pright BKA presented with complaints ofleft leg pain and swelling over the last 3 weeks. Swelling actually goes up into her abdomen.  - Evaluated on 3/16 in the emergency department for the same. It appears BNP was elevated at 627.6 at that time and she was given 80 mg of Lasix along with a short prescription for Lasix 20 mg daily.  -She tried to follow-up with her primary care provider but had not been able to get an appointment yet. Due to worsening symptoms she came to the hospital for further evaluation.  -In the emergency room she was noted to have creatinine of 1.3, BNP of 912, chest x-ray noted cardiomegaly and vascular congestion, urinalysis was abnormal    HPI/Recap of past 24 hours: She is seen after returned from stress test, she is sitting up in bed, eating lunch, denies chest pain ( reports chest pain last Friday), no sob She reports edema has much improved , but thigh and leg remain tight   Assessment/Plan: Principal Problem:   Acute exacerbation of CHF (congestive heart failure) ([place]) Active Problems:   S/P BKA (below knee amputation) unilateral (HCC)   Type II diabetes mellitus (HCC)   Hyperkalemia   CKD (chronic kidney disease) stage 3, GFR 30-59 ml/min   Leukopenia   History of ITP   Acute diastolic CHF (congestive heart failure) (HCC)   Acute diastolic CHF -Presenting with fluid overload, echocardiogram with preserved EF, unable to assess RV , on lasix, cardiac stress test result pending Will follow cardiology recommendation   Left leg edema Negative DVt  Hyperkalemia: Resolved.  Urinary tract infection: Ecoli -Urine culture with pansensitive E. Coli -Has finished  total 7 days of antibiotics initially IV  Rocephin and then p.o. Keflex  CKDIIIa, close to baseline  noninsulin dependent dm2 with Retinopathy  cataractsBaseline poor vision  H/o right BKA  History ofITP: Platelet counts currentlywithin normal limits.  -Followed by Dr. Lindi Adie of hematology and treated with IVIG.  Comment from oncology appreciated.  She is cleared for cardiac cath and for antiplatelet if needed. -Continue outpatient follow-up  Body mass index is 38.54 kg/m.    DVT Prophylaxis:lovenox  Code Status: full  Family Communication: patient   Disposition Plan:    Patient came from:             lives along                                                                                             Anticipated d/c place: she prefer to go home with home health  Barriers to d/c OR conditions which need to be met to effect a safe d/c: home with home health if cleared by cardiology, she remains on iv lasix   Consultants:  cardiology  Procedures:  Two day stress test  Antibiotics:  As above   Objective: BP 107/83 (BP Location: Left Arm)   Pulse 63   Temp  97.9 F (36.6 C) (Oral)   Resp 17   Ht '5\' 6"'  (1.676 m)   Wt 108.3 kg   LMP  (LMP Unknown)   SpO2 98%   BMI 38.54 kg/m   Intake/Output Summary (Last 24 hours) at 04/18/2019 1325 Last data filed at 04/18/2019 1100 Gross per 24 hour  Intake 1063 ml  Output 1500 ml  Net -437 ml   Filed Weights   04/15/19 0416 04/16/19 0443 04/17/19 0415  Weight: 109.1 kg 109.5 kg 108.3 kg    Exam: Patient is examined daily including today on 04/18/2019, exams remain the same as of yesterday except that has changed    General:  NAD  Cardiovascular: RRR  Respiratory: CTABL  Abdomen: Soft/ND/NT, positive BS  Musculoskeletal: h/o right BKA, left leg edema  Neuro: alert, oriented , poor vision at baseline  Data Reviewed: Basic Metabolic Panel: Recent Labs  Lab 04/14/19 0426 04/15/19 0418 04/16/19 0542 04/17/19 0500 04/18/19 0445  NA 136  136 135 135 134*  K 4.1 4.3 4.1 4.1 4.2  CL 99 98 99 101 99  CO2 '27 26 26 25 26  ' GLUCOSE 106* 151* 144* 110* 142*  BUN 29* 36* 43* 46* 51*  CREATININE 1.41* 1.45* 1.70* 1.50* 1.38*  CALCIUM 8.8* 8.6* 8.8* 8.5* 8.7*   Liver Function Tests: No results for input(s): AST, ALT, ALKPHOS, BILITOT, PROT, ALBUMIN in the last 168 hours. No results for input(s): LIPASE, AMYLASE in the last 168 hours. No results for input(s): AMMONIA in the last 168 hours. CBC: Recent Labs  Lab 04/12/19 0517 04/15/19 1406 04/16/19 0827  WBC 3.0* 3.2* 3.2*  NEUTROABS 1.8  --  1.9  HGB 9.2* 10.3* 9.8*  HCT 31.0* 34.6* 33.2*  MCV 92.0 89.4 91.2  PLT 208 191 124*   Cardiac Enzymes:   No results for input(s): CKTOTAL, CKMB, CKMBINDEX, TROPONINI in the last 168 hours. BNP (last 3 results) Recent Labs    04/11/19 0351 04/14/19 0842 04/15/19 0418  BNP 912.4* 452.2* 415.0*    ProBNP (last 3 results) No results for input(s): PROBNP in the last 8760 hours.  CBG: Recent Labs  Lab 04/17/19 1055 04/17/19 1611 04/17/19 2119 04/18/19 0602 04/18/19 1214  GLUCAP 141* 104* 150* 125* 130*    Recent Results (from the past 240 hour(s))  Urine culture     Status: Abnormal   Collection Time: 04/11/19  6:08 AM   Specimen: Urine, Random  Result Value Ref Range Status   Specimen Description URINE, RANDOM  Final   Special Requests NONE  Final   Culture (A)  Final    >=100,000 COLONIES/mL ESCHERICHIA COLI SUSCEPTIBILITIES TO FOLLOW Performed at [facility], [place] [address], [place], [place] [address]    Report Status 04/13/2019 FINAL  Final   Organism ID, Bacteria ESCHERICHIA COLI (A)  Final      Susceptibility   Escherichia coli - MIC*    AMPICILLIN <=2 SENSITIVE Sensitive     CEFAZOLIN <=4 SENSITIVE Sensitive     CEFTRIAXONE <=0.25 SENSITIVE Sensitive     CIPROFLOXACIN <=0.25 SENSITIVE Sensitive     GENTAMICIN <=1 SENSITIVE Sensitive     IMIPENEM <=0.25 SENSITIVE Sensitive     NITROFURANTOIN  <=16 SENSITIVE Sensitive     TRIMETH/SULFA <=20 SENSITIVE Sensitive     AMPICILLIN/SULBACTAM <=2 SENSITIVE Sensitive     PIP/TAZO <=4 SENSITIVE Sensitive     * >=100,000 COLONIES/mL ESCHERICHIA COLI  SARS CORONAVIRUS 2 (TAT 6-24 HRS) Nasopharyngeal Nasopharyngeal Swab  Status: None   Collection Time: 04/11/19  6:26 AM   Specimen: Nasopharyngeal Swab  Result Value Ref Range Status   SARS Coronavirus 2 NEGATIVE NEGATIVE Final    Comment: (NOTE) SARS-CoV-2 target nucleic acids are NOT DETECTED. The SARS-CoV-2 RNA is generally detectable in upper and lower respiratory specimens during the acute phase of infection. Negative results do not preclude SARS-CoV-2 infection, do not rule out co-infections with other pathogens, and should not be used as the sole basis for treatment or other patient management decisions. Negative results must be combined with clinical observations, patient history, and epidemiological information. The expected result is Negative. Fact Sheet for Patients: SugarRoll.be Fact Sheet for Healthcare Providers: https://www.woods-mathews.com/ This test is not yet approved or cleared by the [place] FDA and  has been authorized for detection and/or diagnosis of SARS-CoV-2 by FDA under an Emergency Use Authorization (EUA). This EUA will remain  in effect (meaning this test can be used) for the duration of the COVID-19 declaration under Section 56 4(b)(1) of the Act, 21 U.S.C. section 360bbb-3(b)(1), unless the authorization is terminated or revoked sooner. Performed at [facility], [place] [address] [place], [place] [address]      Studies: No results found.  Scheduled Meds: . amLODipine  10 mg Oral Daily  . enoxaparin (LOVENOX) injection  40 mg Subcutaneous Q24H  . furosemide  40 mg Intravenous BID  . gabapentin  400 mg Oral BID  . insulin aspart  0-9 Units Subcutaneous TID WC  . isosorbide mononitrate  30 mg  Oral Daily  . metoprolol succinate  50 mg Oral Daily  . rosuvastatin  20 mg Oral Daily  . sodium chloride flush  3 mL Intravenous Q12H    Continuous Infusions: . sodium chloride 250 mL (04/13/19 0631)     Time spent: 89mns I have personally reviewed and interpreted on  04/18/2019 daily labs, tele strips, imagings as discussed above under date review session and assessment and plans.  I reviewed all nursing notes, pharmacy notes, consultant notes,  vitals, pertinent old records  I have discussed plan of care as described above with RN , patient on 04/18/2019   FFlorencia ReasonsMD, PhD, FACP  [facility]  Available via Epic secure chat 7am-7pm for nonurgent issues Please page for urgent issues, pager number available through [email or web address] .   04/18/2019, 1:25 PM  LOS: 7 days

## 2019-04-18 NOTE — Progress Notes (Signed)
   [name] presented for a nuclear stress test today.  No immediate complications.  Stress imaging is pending at this time.  Preliminary EKG findings may be listed in the chart, but the stress test result will not be finalized until perfusion imaging is complete.  1 day study, [facility] to read.  Theodore Demark, PA-C 04/18/2019, 9:38 AM

## 2019-04-18 NOTE — Progress Notes (Addendum)
Progress Note  Patient Name: [name] Date of Encounter: 04/18/2019  Primary Cardiologist: Dr Margaretann Loveless  Subjective   No SOB at rest, no chest pain  Inpatient Medications    Scheduled Meds: . amLODipine  10 mg Oral Daily  . enoxaparin (LOVENOX) injection  40 mg Subcutaneous Q24H  . furosemide  40 mg Intravenous BID  . gabapentin  400 mg Oral BID  . insulin aspart  0-9 Units Subcutaneous TID WC  . metoprolol succinate  50 mg Oral Daily  . regadenoson      . rosuvastatin  20 mg Oral Daily  . sodium chloride flush  3 mL Intravenous Q12H   Continuous Infusions: . sodium chloride 250 mL (04/13/19 0631)   PRN Meds: sodium chloride, acetaminophen, aspirin EC, gabapentin, HYDROcodone-acetaminophen, ondansetron (ZOFRAN) IV, sodium chloride flush   Vital Signs    Vitals:   04/17/19 1932 04/18/19 0523 04/18/19 0903 04/18/19 0913  BP: 138/68 130/69 (!) 146/76 (!) 147/73  Pulse: 73 65 68 70  Resp: 18 18 16 16   Temp: 98.6 F (37 C) 98.1 F (36.7 C)    TempSrc: Oral Oral    SpO2: 95% 93%    Weight:      Height:        Intake/Output Summary (Last 24 hours) at 04/18/2019 0928 Last data filed at 04/17/2019 2230 Gross per 24 hour  Intake 1183 ml  Output 1500 ml  Net -317 ml   Last 3 Weights 04/17/2019 04/16/2019 04/15/2019  Weight (lbs) 238 lb 12.1 oz 241 lb 6.5 oz 240 lb 8.4 oz  Weight (kg) 108.3 kg 109.5 kg 109.1 kg      Telemetry    SR, seen in nuc med- Personally Reviewed  Physical Exam   GEN: No acute distress.   Neck: some JVD seen, mild Cardiac: RRR, no murmurs, rubs, or gallops.  Respiratory: diminished to auscultation bilaterally with rales in the bases. GI: Soft, nontender, non-distended  MS: trace LLE edema; s/p R BKA. No deformity. Neuro:  Nonfocal  Psych: Normal affect   Labs     Chemistry Recent Labs  Lab 04/16/19 0542 04/17/19 0500 04/18/19 0445  NA 135 135 134*  K 4.1 4.1 4.2  CL 99 101 99  CO2 26 25 26   GLUCOSE 144* 110* 142*    BUN 43* 46* 51*  CREATININE 1.70* 1.50* 1.38*  CALCIUM 8.8* 8.5* 8.7*  GFRNONAA 34* 40* 44*  GFRAA 39* 46* 51*  ANIONGAP 10 9 9      Hematology Recent Labs  Lab 04/12/19 0517 04/15/19 1406 04/16/19 0827  WBC 3.0* 3.2* 3.2*  RBC 3.37* 3.87 3.64*  HGB 9.2* 10.3* 9.8*  HCT 31.0* 34.6* 33.2*  MCV 92.0 89.4 91.2  MCH 27.3 26.6 26.9  MCHC 29.7* 29.8* 29.5*  RDW 18.6* 17.9* 17.9*  PLT 208 191 124*    BNP Recent Labs  Lab 04/14/19 0842 04/15/19 0418  BNP 452.2* 415.0*     Patient Profile     54 y.o. female with past medical history of diabetes mellitus, hypertension, ITP, prior right BKA for evaluation of acute diastolic congestive heart failure.  Echocardiogram this admission shows normal LV function, moderate left ventricular hypertrophy.  Assessment & Plan    1 acute diastolic congestive heart failure- - I/O net -10.48 L since admit (only -93ml yesterday) - wt today is pending, need to get - some volume overload still, continue IV Lasix  2 history of chest pain- - pt reluctant to have cath>>lexi MV  today - on ASA, BB, statin  3 acute on chronic stage III kidney disease- - Cr trending down, BUN trending up - cont. To follow w/ diuresis  4 hypertension- - SBP 130s-140s on amlodipine 10 mg, Lasix 40 mg IV BID, Toprol XL 50 mg qd - w/ D-CHF, pt would benefit from hydralazine and/or nitrates - add Imdur 30 mg qd   For questions or updates, please contact [facility] Please consult [email or web address] for contact info under     Signed, Theodore Demark, PA-C  04/18/2019, 9:28 AM   As above, patient seen and examined.  She denies dyspnea or chest pain.  She does continue to have volume excess.  We will continue Lasix at present dose.  Continue present blood pressure medications.  She declined cardiac catheterization but was agreeable to [facility] nuclear study.  We will await results.  If high risk we will discuss catheterization further with her.  If normal will treat  medically. Olga Millers, MD

## 2019-04-18 NOTE — Telephone Encounter (Signed)
R/s appt per 4/14 sch message - unable to reach - left message with appt date and time

## 2019-04-18 NOTE — Progress Notes (Signed)
ReDS Clip Diuretic Study Pt study # [id number]  Your patient is in the Blinded arm of the ReDS Clip Diuretic study.  Your patient has had a ReDS reading and the reading has been transmitted to the cloud.   Thank You   The research team    Leota Sauers Pharm.D. CPP, BCPS Clinical Pharmacist [phone] 04/18/2019 1:33 PM    Please check [email or web address] for unit-specific pharmacist phone numbers

## 2019-04-18 NOTE — Progress Notes (Signed)
Physical Therapy Treatment Patient Details Name: [name] MRN: [id number] DOB: 1965-02-22 Today's Date: 04/18/2019    History of Present Illness 54 yo admitted with bil LE edema and CHF. PMHx: Rt BKA, Dm, HTN, neuropathy    PT Comments    Pt sitting in straight back chair at sink with OT on entry. Pt initially hesitant but ultimately agrees to perform transfer to her w/c and propel in hallway. Pt is min guard for transfers, requiring increased vc for set up due to novel conditions and low vision. Once in chair pt able to navigate in hallway with supervision and vc for navigation around obstacles in hallway. Pt is making good progress towards her goals. Pt is hopeful for discharge home with HHPT tomorrow. Given pt's performance today PT feels this d'c plan is appropriate.    Follow Up Recommendations  Home health PT;Supervision for mobility/OOB     Equipment Recommendations  None recommended by PT       Precautions / Restrictions Precautions Precautions: Fall Precaution Comments: pt with very low vision and reports progressive decline in vision for 2 years. Has a prosthesis but never used it Restrictions Weight Bearing Restrictions: No    Mobility  Bed Mobility Overal bed mobility: Needs Assistance Bed Mobility: Supine to Sit     Supine to sit: Modified independent (Device/Increase time);HOB elevated     General bed mobility comments: no physical assist required  Transfers Overall transfer level: Needs assistance Equipment used: Rolling walker (2 wheeled) Transfers: Sit to/from [facility] Sit to Stand: Min guard Stand pivot transfers: Min guard       General transfer comment: use of RW for sit to stand fom bed, BSC and chair at sink      Balance Overall balance assessment: Needs assistance Sitting-balance support: Bilateral upper extremity supported Sitting balance-Leahy Scale: Fair     Standing balance support: Bilateral upper  extremity supported Standing balance-Leahy Scale: Poor Standing balance comment: Pt able to stand for tasks, but unable to release single UE for ADL.                            Cognition Arousal/Alertness: Awake/alert Behavior During Therapy: WFL for tasks assessed/performed Overall Cognitive Status: Impaired/Different from baseline Area of Impairment: Safety/judgement;Problem solving                         Safety/Judgement: Decreased awareness of safety;Decreased awareness of deficits   Problem Solving: Slow processing General Comments: Pt requiring cues for processing through tasks         General Comments General comments (skin integrity, edema, etc.): VSS on RA. Pt hopping 10' in total, 2' then 8' with RW and minguardA      Pertinent Vitals/Pain Pain Assessment: Faces Faces Pain Scale: No hurt Pain Intervention(s): Monitored during session           PT Goals (current goals can now be found in the care plan section) Acute Rehab PT Goals Patient Stated Goal: return to my house PT Goal Formulation: With patient Time For Goal Achievement: 04/26/19 Potential to Achieve Goals: Fair Progress towards PT goals: Progressing toward goals    Frequency    Min 3X/week      PT Plan Current plan remains appropriate       AM-PAC PT "6 Clicks" Mobility   Outcome Measure  Help needed turning from your back to your side while in a  flat bed without using bedrails?: A Little Help needed moving from lying on your back to sitting on the side of a flat bed without using bedrails?: A Little Help needed moving to and from a bed to a chair (including a wheelchair)?: None Help needed standing up from a chair using your arms (e.g., wheelchair or bedside chair)?: None Help needed to walk in hospital room?: A Little Help needed climbing 3-5 steps with a railing? : Total 6 Click Score: 18    End of Session Equipment Utilized During Treatment: Gait  belt Activity Tolerance: Patient tolerated treatment well Patient left: in chair;with call bell/phone within reach;with chair alarm set Nurse Communication: Mobility status;Precautions PT Visit Diagnosis: Other abnormalities of gait and mobility (R26.89);Muscle weakness (generalized) (M62.81)     Time: 7183-6725 PT Time Calculation (min) (ACUTE ONLY): 20 min  Charges:  $Wheel Chair Management: 8-22 mins                     Charlette Hennings B. Beverely Risen PT, DPT Acute Rehabilitation Services Pager [phone] Office [phone]    Elon Alas Fleet 04/18/2019, 5:55 PM

## 2019-04-18 NOTE — Plan of Care (Signed)
?  Problem: Education: ?Goal: Ability to demonstrate management of disease process will improve ?Outcome: Progressing ?  ?Problem: Activity: ?Goal: Capacity to carry out activities will improve ?Outcome: Progressing ?  ?Problem: Cardiac: ?Goal: Ability to achieve and maintain adequate cardiopulmonary perfusion will improve ?Outcome: Progressing ?  ?

## 2019-04-18 NOTE — Progress Notes (Signed)
Occupational Therapy Treatment Patient Details Name: [name] MRN: [id number] DOB: 16-May-1965 Today's Date: 04/18/2019    History of present illness 54 yo admitted with bil LE edema and CHF. PMHx: Rt BKA, Dm, HTN, neuropathy   OT comments  Pt progressing to ADL at sink. Pt able to stand for tasks, but unable to release single UE for ADL. Set-upA for grooming at sink in sitting; Pt sat for pericare on BSC.  Pt hopping 10' in total, 2' then 8' with RW and minguardA. VSS on RA. Pt requiring minimal verbal cues to increase awareness of deficits. Pt would benefit from continued OT skilled services for ADL, mobility and safety. OT following acutely.   Follow Up Recommendations  Home health OT;Supervision/Assistance - 24 hour;Other (comment)    Equipment Recommendations  None recommended by OT    Recommendations for Other Services      Precautions / Restrictions Precautions Precautions: Fall Precaution Comments: pt with very low vision and reports progressive decline in vision for 2 years. Has a prosthesis but never used it Restrictions Weight Bearing Restrictions: No       Mobility Bed Mobility Overal bed mobility: Needs Assistance Bed Mobility: Supine to Sit     Supine to sit: Modified independent (Device/Increase time);HOB elevated     General bed mobility comments: no physical assist required  Transfers Overall transfer level: Needs assistance Equipment used: Rolling walker (2 wheeled) Transfers: Sit to/from [facility] Sit to Stand: Min guard Stand pivot transfers: Min guard       General transfer comment: use of RW for sit to stand fom bed, BSC and chair at sink    Balance Overall balance assessment: Needs assistance Sitting-balance support: Bilateral upper extremity supported Sitting balance-Leahy Scale: Fair     Standing balance support: Bilateral upper extremity supported Standing balance-Leahy Scale: Poor Standing balance comment:  Pt able to stand for tasks, but unable to release single UE for ADL.                           ADL either performed or assessed with clinical judgement   ADL Overall ADL's : Needs assistance/impaired     Grooming: Set up;Sitting Grooming Details (indicate cue type and reason): Pt attempting to stand, but unsafely unable to manage             Lower Body Dressing: Set up;Sitting/lateral leans Lower Body Dressing Details (indicate cue type and reason): donning LLE sock Toilet Transfer: Min Agricultural engineer Details (indicate cue type and reason): Hopping 2' to [place] and Hygiene: Min Product manager Details (indicate cue type and reason): Pt unable to stand to perform pericare; sat down     Functional mobility during ADLs: Min guard;Rolling walker;Cueing for safety General ADL Comments: Pt limited by decreased ability to care for self, pt in new environment with visual impairment and unable to fully feel confident in new setting     Vision   Vision Assessment?: Vision impaired- to be further tested in functional context Additional Comments: low vision; due to cataract sx   Perception     Praxis      Cognition Arousal/Alertness: Awake/alert Behavior During Therapy: WFL for tasks assessed/performed Overall Cognitive Status: Impaired/Different from baseline Area of Impairment: Safety/judgement;Problem solving                         Safety/Judgement: Decreased awareness  of safety;Decreased awareness of deficits   Problem Solving: Slow processing General Comments: Pt requiring cues for processing through tasks        Exercises     Shoulder Instructions       General Comments VSS on RA. Pt hopping 10' in total, 2' then 8' with RW and minguardA    Pertinent Vitals/ Pain       Pain Assessment: Faces Faces Pain Scale: No hurt Pain Intervention(s):  Monitored during session  Home Living                                          Prior Functioning/Environment              Frequency  Min 2X/week        Progress Toward Goals  OT Goals(current goals can now be found in the care plan section)  Progress towards OT goals: Progressing toward goals  Acute Rehab OT Goals Patient Stated Goal: return to my house OT Goal Formulation: With patient Time For Goal Achievement: 04/27/19 Potential to Achieve Goals: Good ADL Goals Pt Will Perform Lower Body Bathing: with min guard assist;sit to/from stand Pt Will Perform Lower Body Dressing: with min guard assist;sit to/from stand Pt Will Transfer to Toilet: with min assist;stand pivot transfer Pt/caregiver will Perform Home Exercise Program: Increased strength;Both right and left upper extremity;With theraband  Plan Discharge plan remains appropriate    Co-evaluation                 AM-PAC OT "6 Clicks" Daily Activity     Outcome Measure   Help from another person eating meals?: None Help from another person taking care of personal grooming?: None Help from another person toileting, which includes using toliet, bedpan, or urinal?: A Little Help from another person bathing (including washing, rinsing, drying)?: A Lot Help from another person to put on and taking off regular upper body clothing?: A Little Help from another person to put on and taking off regular lower body clothing?: A Lot 6 Click Score: 18    End of Session Equipment Utilized During Treatment: Rolling walker  OT Visit Diagnosis: Unsteadiness on feet (R26.81);Muscle weakness (generalized) (M62.81)   Activity Tolerance Patient tolerated treatment well   Patient Left Other (comment)(up with PT in room)   Nurse Communication Mobility status        Time: 0962-8366 OT Time Calculation (min): 27 min  Charges: OT General Charges $OT Visit: 1 Visit OT Treatments $Self Care/Home  Management : 23-37 mins  Flora Lipps, OTR/L Acute Rehabilitation Services Pager: [phone] Office: [phone]    [name] 04/18/2019, 5:02 PM

## 2019-04-19 DIAGNOSIS — D696 Thrombocytopenia, unspecified: Secondary | ICD-10-CM

## 2019-04-19 DIAGNOSIS — I5033 Acute on chronic diastolic (congestive) heart failure: Secondary | ICD-10-CM | POA: Diagnosis not present

## 2019-04-19 DIAGNOSIS — Z89519 Acquired absence of unspecified leg below knee: Secondary | ICD-10-CM | POA: Diagnosis not present

## 2019-04-19 DIAGNOSIS — E875 Hyperkalemia: Secondary | ICD-10-CM | POA: Diagnosis not present

## 2019-04-19 DIAGNOSIS — Z862 Personal history of diseases of the blood and blood-forming organs and certain disorders involving the immune mechanism: Secondary | ICD-10-CM | POA: Diagnosis not present

## 2019-04-19 LAB — CBC WITH DIFFERENTIAL/PLATELET
Abs Immature Granulocytes: 0 10*3/uL (ref 0.00–0.07)
Basophils Absolute: 0 10*3/uL (ref 0.0–0.1)
Basophils Relative: 0 %
Eosinophils Absolute: 0.1 10*3/uL (ref 0.0–0.5)
Eosinophils Relative: 1 %
HCT: 32.4 % — ABNORMAL LOW (ref 36.0–46.0)
Hemoglobin: 9.8 g/dL — ABNORMAL LOW (ref 12.0–15.0)
Immature Granulocytes: 0 %
Lymphocytes Relative: 26 %
Lymphs Abs: 0.9 10*3/uL (ref 0.7–4.0)
MCH: 26.8 pg (ref 26.0–34.0)
MCHC: 30.2 g/dL (ref 30.0–36.0)
MCV: 88.5 fL (ref 80.0–100.0)
Monocytes Absolute: 0.5 10*3/uL (ref 0.1–1.0)
Monocytes Relative: 13 %
Neutro Abs: 2.1 10*3/uL (ref 1.7–7.7)
Neutrophils Relative %: 60 %
Platelets: 71 10*3/uL — ABNORMAL LOW (ref 150–400)
RBC: 3.66 MIL/uL — ABNORMAL LOW (ref 3.87–5.11)
RDW: 17.4 % — ABNORMAL HIGH (ref 11.5–15.5)
WBC: 3.5 10*3/uL — ABNORMAL LOW (ref 4.0–10.5)
nRBC: 0 % (ref 0.0–0.2)

## 2019-04-19 LAB — BASIC METABOLIC PANEL
Anion gap: 11 (ref 5–15)
BUN: 52 mg/dL — ABNORMAL HIGH (ref 6–20)
CO2: 23 mmol/L (ref 22–32)
Calcium: 8.6 mg/dL — ABNORMAL LOW (ref 8.9–10.3)
Chloride: 100 mmol/L (ref 98–111)
Creatinine, Ser: 1.52 mg/dL — ABNORMAL HIGH (ref 0.44–1.00)
GFR calc Af Amer: 45 mL/min — ABNORMAL LOW (ref >60)
GFR calc non Af Amer: 39 mL/min — ABNORMAL LOW (ref >60)
Glucose, Bld: 135 mg/dL — ABNORMAL HIGH (ref 70–99)
Potassium: 4.3 mmol/L (ref 3.5–5.1)
Sodium: 134 mmol/L — ABNORMAL LOW (ref 135–145)

## 2019-04-19 LAB — GLUCOSE, CAPILLARY
Glucose-Capillary: 125 mg/dL — ABNORMAL HIGH (ref 70–99)
Glucose-Capillary: 125 mg/dL — ABNORMAL HIGH (ref 70–99)
Glucose-Capillary: 159 mg/dL — ABNORMAL HIGH (ref 70–99)

## 2019-04-19 MED ORDER — ISOSORBIDE MONONITRATE ER 30 MG PO TB24: 30.0000 mg | ORAL_TABLET | Freq: Every day | ORAL | 0 refills | Status: DC

## 2019-04-19 MED ORDER — ROSUVASTATIN CALCIUM 20 MG PO TABS: 20.0000 mg | ORAL_TABLET | Freq: Every day | ORAL | 0 refills | Status: DC

## 2019-04-19 MED ORDER — FUROSEMIDE 40 MG PO TABS: 40.0000 mg | ORAL_TABLET | Freq: Two times a day (BID) | ORAL | Status: DC

## 2019-04-19 MED ORDER — FUROSEMIDE 40 MG PO TABS: 40.0000 mg | ORAL_TABLET | Freq: Two times a day (BID) | ORAL | 0 refills | Status: DC

## 2019-04-19 MED ORDER — METOPROLOL SUCCINATE ER 50 MG PO TB24: 50.0000 mg | ORAL_TABLET | Freq: Every day | ORAL | 0 refills | Status: DC

## 2019-04-19 MED ORDER — LOPERAMIDE HCL 2 MG PO CAPS
2.0000 mg | ORAL_CAPSULE | Freq: Four times a day (QID) | ORAL | Status: DC | PRN
  Administered 2019-04-19: 2 mg via ORAL
  Filled 2019-04-19: qty 1

## 2019-04-19 MED ORDER — GABAPENTIN 400 MG PO CAPS: 400.0000 mg | ORAL_CAPSULE | Freq: Two times a day (BID) | ORAL | 0 refills | Status: DC

## 2019-04-19 MED FILL — METOPROLOL SUCCINATE ER 50: 50 | 30 days supply | Qty: 30 | Fill #0

## 2019-04-19 MED FILL — ROSUVASTATIN CALCIUM 20 MG: 20 | 30 days supply | Qty: 30 | Fill #0

## 2019-04-19 MED FILL — FUROSEMIDE 40 MG TABLET: 40 | 30 days supply | Qty: 60 | Fill #0

## 2019-04-19 MED FILL — ISOSORBIDE MN ER 30 MG TAB: 30 | 30 days supply | Qty: 30 | Fill #0

## 2019-04-19 NOTE — Progress Notes (Signed)
Patient on bedpain, [facility] representative arrives at bedside, pt requested they return later.

## 2019-04-19 NOTE — TOC Transition Note (Signed)
Transition of Care [facility]) - CM/SW Discharge Note   Patient Details  Name: [name] MRN: [id number] Date of Birth: 11/26/1965  Transition of Care [facility]) CM/SW Contact:  Leone Haven, RN Phone Number: 04/19/2019, 9:30 AM   Clinical Narrative:    Patient for possible dc today, she is set up with [facility] for HHRN , HHPT, HHOT. .  MD notified to put orders in for [facility] services.    Final next level of care: Home w Home Health Services Barriers to Discharge: No Barriers Identified   Patient Goals and CMS Choice Patient states their goals for this hospitalization and ongoing recovery are:: to walk again CMS Medicare.gov Compare Post Acute Care list provided to:: Patient Choice offered to / list presented to : Patient  Discharge Placement                       Discharge Plan and Services In-house Referral: NA Discharge Planning Services: CM Consult Post Acute Care Choice: Home Health            DME Agency: NA       HH Arranged: RN, Disease Management, PT, OT HH Agency: [facility] Date HH Agency Contacted: 04/13/19 Time HH Agency Contacted: 1300 Representative spoke with at [facility] Agency: Lupita Leash  Social Determinants of Health (SDOH) Interventions     Readmission Risk Interventions Readmission Risk Prevention Plan 04/13/2019 11/13/2018  Transportation Screening Complete Complete  PCP or Specialist Appt within 5-7 Days - Complete  PCP or Specialist Appt within 3-5 Days Complete -  Home Care Screening - Complete  Medication Review (RN CM) - Complete  HRI or Home Care Consult Complete -  Social Work Consult for Recovery Care Planning/Counseling Complete -  Palliative Care Screening Not Applicable -  Medication Review Oceanographer) Complete -  Some recent data might be hidden

## 2019-04-19 NOTE — Progress Notes (Signed)
Patients ride called while staff was at bedside, staff verbalized during call that patient is ready for pick up. Driver was available within 15 minutes at the main entrance.    Pt dressed, transferred to personal wheel chair, and escorted to main entrance by [facility] staff.  Patient belongings were provided to patient, items/location of items verified with patient upon exiting room.   2 phone chargers placed in patient's bag, white belongings bag of patients medications from TOC and AVS and left over fruit was closed and placed within her personal luggage.   Patient left with cell phone in hand, and shoe and sunglasses on.

## 2019-04-19 NOTE — Progress Notes (Addendum)
Progress Note  Patient Name: [name] Date of Encounter: 04/19/2019  Primary Cardiologist: Dr Margaretann Loveless  Subjective   Pt denies CP or dyspnea  Inpatient Medications    Scheduled Meds: . enoxaparin (LOVENOX) injection  40 mg Subcutaneous Q24H  . furosemide  40 mg Intravenous BID  . gabapentin  400 mg Oral BID  . insulin aspart  0-9 Units Subcutaneous TID WC  . isosorbide mononitrate  30 mg Oral Daily  . metoprolol succinate  50 mg Oral Daily  . rosuvastatin  20 mg Oral Daily  . sodium chloride flush  3 mL Intravenous Q12H   Continuous Infusions: . sodium chloride 250 mL (04/13/19 0631)   PRN Meds: sodium chloride, acetaminophen, aspirin EC, gabapentin, HYDROcodone-acetaminophen, ondansetron (ZOFRAN) IV, sodium chloride flush   Vital Signs    Vitals:   04/18/19 1044 04/18/19 1219 04/18/19 2021 04/19/19 0331  BP: (!) 120/58 107/83 133/69 (!) 109/54  Pulse: 76 63 71 68  Resp:  17 19 20   Temp:  97.9 F (36.6 C) 98 F (36.7 C) 98.5 F (36.9 C)  TempSrc:  Oral Oral Oral  SpO2:  98% 95% 95%  Weight:    106.6 kg  Height:        Intake/Output Summary (Last 24 hours) at 04/19/2019 0943 Last data filed at 04/19/2019 0715 Gross per 24 hour  Intake 927.5 ml  Output 1650 ml  Net -722.5 ml   Last 3 Weights 04/19/2019 04/17/2019 04/16/2019  Weight (lbs) 235 lb 0.2 oz 238 lb 12.1 oz 241 lb 6.5 oz  Weight (kg) 106.6 kg 108.3 kg 109.5 kg      Telemetry    Sinus- Personally Reviewed  Physical Exam   GEN: WD/WN, NAD Neck: supple, no JVD Cardiac: RRR, no murmur Respiratory: CTA; no wheeze GI: Soft, NT ND, no masses MS: 1+ thigh edema; s/p right BKA Neuro:  no focal findings Psych: Normal affect   Labs     Chemistry Recent Labs  Lab 04/17/19 0500 04/18/19 0445 04/19/19 0545  NA 135 134* 134*  K 4.1 4.2 4.3  CL 101 99 100  CO2 25 26 23   GLUCOSE 110* 142* 135*  BUN 46* 51* 52*  CREATININE 1.50* 1.38* 1.52*  CALCIUM 8.5* 8.7* 8.6*  GFRNONAA 40* 44*  39*  GFRAA 46* 51* 45*  ANIONGAP 9 9 11      Hematology Recent Labs  Lab 04/15/19 1406 04/16/19 0827 04/19/19 0545  WBC 3.2* 3.2* 3.5*  RBC 3.87 3.64* 3.66*  HGB 10.3* 9.8* 9.8*  HCT 34.6* 33.2* 32.4*  MCV 89.4 91.2 88.5  MCH 26.6 26.9 26.8  MCHC 29.8* 29.5* 30.2  RDW 17.9* 17.9* 17.4*  PLT 191 124* 71*    BNP Recent Labs  Lab 04/14/19 0842 04/15/19 0418  BNP 452.2* 415.0*     Patient Profile     54 y.o. female with past medical history of diabetes mellitus, hypertension, ITP, prior right BKA for evaluation of acute diastolic congestive heart failure.  Echocardiogram this admission shows normal LV function, moderate left ventricular hypertrophy.  Assessment & Plan    1 acute diastolic congestive heart failure-improved compared to admission.  Will change Lasix to 40 mg by mouth twice daily.  Continue fluid restriction and low-sodium diet.  2 history of chest pain-no recurrent symptoms.  Nuclear study yesterday showed no ischemia.  Plan medical therapy unless she has recurrent symptoms.  Continue statin.  Continue beta-blocker.    3 acute on chronic stage III kidney disease-check  potassium and renal function 1 week following discharge.  4 hypertension-continue present medications and follow as outpatient.  5 thrombocytopenia-platelet count decreasing.  Needs follow-up with oncology.  History of ITP.  Her platelet count has been as low as 5000 in the past 6 to 8 weeks.  Will discontinue aspirin.  Patient can be discharged from a cardiac standpoint.  Continue present medications as listed in MAR.  Check bmet 1 week.  Follow-up APP 1 week.  Follow-up Dr. Jacques Navy 6 to 8 weeks.  For questions or updates, please contact [facility] Please consult [email or web address] for contact info under        Signed, Olga Millers, MD  04/19/2019, 9:43 AM

## 2019-04-19 NOTE — Progress Notes (Signed)
Patient c/o BM requests medication to treat loose stool. Reports frequent use of imodium at home. One time dose order requested by MD. Provided to patient.

## 2019-04-19 NOTE — Progress Notes (Signed)
Patient encouraged to call her ride as she is discharged, and only waiting on return of TOC for medications. Call placed to [facility] to request they return with medications, as not to delay discharge further.

## 2019-04-19 NOTE — Discharge Summary (Signed)
Discharge Summary  [name] YQI:[id number] DOB: October 31, 1965  PCP: Patient, No Pcp Per  Admit date: 04/10/2019 Discharge date: 04/19/2019  Time spent: 47mns, more than 50% time spent on coordination of care.  Recommendations for Outpatient Follow-up:  1. F/u with PCP within a week  for hospital discharge follow up, repeat cbc/bmp at follow up. 2.  f/u with cardiology in one week for bmp then Dr AAlveda Reasonsin 4-6 weeks 3. F/u hematology oncology Dr. GLindi Adieon 4/21 4. Home health arranged   New medication at discharge: Lopressor, Imdur, Crestor Lasix dose increased Neurontin dose decreased Discontinued aspirin and Norvasc  Discharge Diagnoses:  Active Hospital Problems   Diagnosis Date Noted  . Acute exacerbation of CHF (congestive heart failure) ([facility] 04/11/2019  . Acute diastolic CHF (congestive heart failure) ([facility]   . Leukopenia 04/11/2019  . History of ITP 04/11/2019  . CKD (chronic kidney disease) stage 3, GFR 30-59 ml/min   . Hyperkalemia 01/25/2018  . Type II diabetes mellitus ([facility]   . S/P BKA (below knee amputation) unilateral ([facility]     Resolved Hospital Problems  No resolved problems to display.    Discharge Condition: stable  Diet recommendation: heart healthy/carb modified  Filed Weights   04/16/19 0443 04/17/19 0415 04/19/19 0331  Weight: 109.5 kg 108.3 kg 106.6 kg    History of present illness: (Per admitting MD Dr. STamala Julian [facility] Patient coming from: Home  Chief Complaint: Left leg pain  I have personally briefly reviewed patient[place]  HPI: Cheryl VERDEJOis a 54y.o. female with medical history significant of hypertension, DM type II, anemia, ITP, s/p right BKA presents with complaints of left leg pain and swelling over the last 3 weeks.  Swelling actually goes up into her abdomen.  She reports having a sharp pain that comes and goes in her left leg.  Unable to ambulate because of  the discomfort.  Associated symptoms include dyspnea on exertion and generalized malaise.  She reports having decreased vision due to cataracts.  Evaluated on 3/16 in the emergency department for the same.  It appears BNP was elevated at 627.6 at that time and she was given 80 mg of Lasix along with a short prescription for Lasix 20 mg daily.  After the 80 mg of Lasix she reported being a lot, but after getting home did not have the same effect with the 20 mg Lasix dose.  She tried to follow-up with her primary care provider but had not been able to get an appointment yet.  Due to worsening symptoms she came to the hospital for further evaluation.  She also notes that her vision is decreased due to cataracts in both eyes that she is in the process of getting removed.  Since her hospitalization in November for ITP she reports that she has had no bleeding.  She has been receiving infusions of immune globulin [facility]  ED Course: Upon admission into the emergency department patient was found to be afebrile, respirations 15-26, blood pressures 136/74-170 9/79, and O2 saturations maintained on room air.  Labs significant for WBC 3.9, hemoglobin 9.4, sodium 133, potassium 6, BUN 27, creatinine 1.33, and BNP 912.4.  Chest x-ray noting cardiomegaly with vascular congestion/edema.  UA was positive for signs of infection.  Patient was given Rocephin and 40 mg of Lasix.  TRH called to admit  Hospital Course:  Principal Problem:  Acute exacerbation of CHF (congestive heart failure) (HCC) Active Problems:   S/P BKA (below knee amputation) unilateral (HCC)   Type II diabetes mellitus (HCC)   Hyperkalemia   CKD (chronic kidney disease) stage 3, GFR 30-59 ml/min   Leukopenia   History of ITP   Acute diastolic CHF (congestive heart failure) (HCC)   Acute diastolic CHF -Presenting with fluid overload, echocardiogram with preserved EF, unable to assess RV ,  -she presents with  significant leg edema,  -volume status improved on iv lasix '40mg'$  bid - cardiac stress test showed no ischemia -she is cleared to discharge on lasix '40mg'$  bid oral and follow up with cardiology in one week for bmp and f/u with cardiology in 4-6 weeks.    Left leg edema Negative DVt Discontinue Norvasc, Neurontin dose reduced as well  Chest pain One time at home,  no recurrent symptoms.  Nuclear study yesterday showed no ischemia.  Plan medical therapy unless she has recurrent symptoms.  Continue statin.  Continue beta-blocker. Avoid aspirin due to ITP Follow-up with cardiology  Hyperkalemia: Resolved.  Urinary tract infection: Ecoli -Urine culture with pansensitive E. Coli -Has finished  total 7 days of antibiotics initially IV Rocephin and then p.o. Keflex  CKDIIIa, close to baseline  noninsulin dependent dm2 with Retinopathy  a1c 5.5 Home meds metformin renal dosing cataractsBaseline poor vision  H/o right BKA  History ofITP: with intermittent severe thrombocytopenia, avoid asa -Followed by Dr. Lindi Adie of hematology and treated with IVIG.  -Platelet number appear trending down ,Case discussed with Dr. Lindi Adie prior to discharge , Dr Lindi Adie will follow up   FTT: wheelchair bound  Body mass index is 38.54 kg/m.    DVT Prophylaxis while in the hospital :lovenox  Code Status: full  Family Communication: patient   Disposition Plan:    Patient came from:lives along  Anticipated d/c place:she prefer to go home with home health  Barriers to d/c OR conditions which need to be met to effect a safe d/c:home with home health if cleared by cardiology, she remains on iv lasix   Consultants:  cardiology  Procedures:  Two day stress test  Antibiotics:  As above   Discharge Exam: BP 139/77   Pulse 70   Temp 98.5 F (36.9 C) (Oral)    Resp 20   Ht '5\' 6"'$  (1.676 m)   Wt 106.6 kg   LMP  (LMP Unknown)   SpO2 95%   BMI 37.93 kg/m     General:  NAD  Cardiovascular: RRR  Respiratory: CTABL  Abdomen: Soft/ND/NT, positive BS  Musculoskeletal: h/o right BKA, left leg edema has improved   Neuro: alert, oriented , poor vision at baseline   Discharge Instructions You were cared for by a hospitalist during your hospital stay. If you have any questions about your discharge medications or the care you received while you were in the hospital after you are discharged, you can call the unit and asked to speak with the hospitalist on call if the hospitalist that took care of you is not available. Once you are discharged, your primary care physician will handle any further medical issues. Please note that NO REFILLS for any discharge medications will be authorized once you are discharged, as it is imperative that you return to your primary care physician (or establish a relationship with a primary care physician if you do not have one) for your aftercare needs so that they can reassess your need for medications and monitor your lab values.  Discharge Instructions    Diet - low sodium heart healthy   Complete by: As directed    Carb modified diet   Increase activity slowly   Complete by: As directed      Allergies as of 04/19/2019   No Known Allergies     Medication List    STOP taking these medications   amLODipine 10 MG tablet Commonly known as: NORVASC   aspirin EC 81 MG tablet     TAKE these medications   acetaminophen 500 MG tablet Commonly known as: TYLENOL Take 1,000 mg by mouth every 6 (six) hours as needed for mild pain.   furosemide 40 MG tablet Commonly known as: LASIX Take 1 tablet (40 mg total) by mouth 2 (two) times daily. What changed:   medication strength  how much to take  when to take this   gabapentin 400 MG capsule Commonly known as: NEURONTIN Take 1 capsule (400 mg total) by mouth 2  (two) times daily. What changed:   when to take this  additional instructions   isosorbide mononitrate 30 MG 24 hr tablet Commonly known as: IMDUR Take 1 tablet (30 mg total) by mouth daily. Start taking on: April 20, 2019   metFORMIN 500 MG tablet Commonly known as: Glucophage Take 1 tablet (500 mg total) by mouth 2 (two) times daily with a meal. What changed: when to take this   metoprolol succinate 50 MG 24 hr tablet Commonly known as: TOPROL-XL Take 1 tablet (50 mg total) by mouth daily. Take with or immediately following a meal. Start taking on: April 20, 2019   rosuvastatin 20 MG tablet Commonly known as: CRESTOR Take 1 tablet (20 mg total) by mouth daily. Start taking on: April 20, 2019      No Known Allergies Follow-up Information    [facility] Follow up.   Why: HHRN , HHPT, [place] Follow up on 04/23/2019.   Specialty: Internal Medicine Why: 8:20 with [employer] information: [place] [phone] [place] [phone]       [place] [facility] Follow up in 1 week(s).   Specialty: Cardiology Contact information: [address], [address] [place]       Elouise Munroe, MD. Daphane Shepherd on 05/03/2019.   Specialties: Cardiology, Radiology Why: '@2'$ :40pm please arrive @ 2:20pm Contact information: [address] [place] [address] [phone]            The results of significant diagnostics from this hospitalization (including imaging, microbiology, ancillary and laboratory) are listed below for reference.    Significant Diagnostic Studies: NM Myocar Multi W/Spect W/Wall Motion / EF  Result Date: 04/18/2019  There was no ST segment deviation noted during stress.  The study is normal.  This is a low risk study.  Nuclear stress EF: 64%.  The left ventricular ejection fraction is normal  (55-65%).    DG Chest Portable 1 View  Result Date: 04/11/2019 CLINICAL DATA:  Bilateral leg swelling and pain. EXAM: PORTABLE CHEST 1 VIEW COMPARISON:  03/20/2019 FINDINGS: Cardiomegaly and symmetric basal predominant interstitial coarsening. No visible effusion or consolidation. No pneumothorax. IMPRESSION: Cardiomegaly with vascular congestion/borderline edema. Electronically Signed   By: Monte Fantasia M.D.   On: 04/11/2019 04:16   DG Chest Port 1 View  Result Date: 03/20/2019 CLINICAL DATA:  Bilateral lower extremity edema for 2 weeks.  Shortness of breath. EXAM: PORTABLE CHEST 1 VIEW COMPARISON:  Chest x-rays dated 02/13/2019 and 03/06/2017 and CT angiogram of the chest dated 02/13/2019 FINDINGS: Chronic cardiomegaly. Pulmonary vascularity is normal. Minimal haziness at the lung bases is probably due to a shallow inspiration. No discrete effusions. No acute bone abnormality. Chronic indentation upon the left side of the trachea above the thoracic inlet consistent with the previously described mass of the left lobe of the thyroid gland. IMPRESSION: No acute abnormality. Chronic cardiomegaly. Electronically Signed   By: Lorriane Shire M.D.   On: 03/20/2019 12:50   ECHOCARDIOGRAM COMPLETE  Result Date: 04/11/2019    ECHOCARDIOGRAM REPORT   Patient Name:   SAMANI DEAL Date of Exam: 04/11/2019 Medical Rec #:  [id number]         Height:       66.0 in Accession #:    [id number]        Weight:       228.2 lb Date of Birth:  Jan 08, 1965         BSA:          2.115 m Patient Age:    63 years          BP:           151/64 mmHg Patient Gender: F                 HR:           90 bpm. Exam Location:  Inpatient Procedure: 2D Echo, Cardiac Doppler and Color Doppler Indications:    CHF-Acute Diastolic 308.65/H84.69  History:        Patient has prior history of Echocardiogram examinations, most                 recent 09-06-66. CHF; Risk Factors:Diabetes and Hypertension.                 CKD.  Sonographer:    Clayton Lefort RDCS (AE) Referring Phys: [id number] [facility] A SMITH  Sonographer Comments: Image acquisition challenging due to uncooperative patient. Patient refused remainder of test after finishing PLAX and SAX images, stating that she "didn't know it was going to be all this." She also did not want to remover her bra or get gel on her bra. IMPRESSIONS  1. Left ventricular ejection fraction, by estimation, is 65 to 70%. The left ventricle has normal function. The left ventricle has no regional wall motion abnormalities. There is moderate left ventricular hypertrophy. Left ventricular diastolic function  could not be evaluated.  2. Right ventricular systolic function was not well visualized. The right ventricular size is not well visualized.  3. The mitral valve is grossly normal. Trivial mitral valve regurgitation.  4. The aortic valve was not well visualized. Aortic valve regurgitation is not visualized. FINDINGS  Left Ventricle: Left ventricular ejection fraction, by estimation, is 65 to 70%. The left ventricle has normal function. The left ventricle has no regional wall motion abnormalities. The left ventricular internal cavity size was normal in size. There is  moderate left ventricular hypertrophy. Left ventricular diastolic function could not be evaluated. Right Ventricle: The right ventricular size is not well visualized. Right vetricular wall thickness was not assessed. Right ventricular systolic function was not well visualized. Left Atrium: Left atrial size was not well visualized. Right Atrium: Right atrial size was not well visualized. Pericardium: There is no evidence of pericardial effusion. Mitral Valve: The mitral valve is grossly normal. Trivial mitral valve regurgitation. Tricuspid  Valve: The tricuspid valve is grossly normal. Tricuspid valve regurgitation is mild. Aortic Valve: The aortic valve was not well visualized. Aortic valve regurgitation is not visualized. Pulmonic Valve: The pulmonic valve was  normal in structure. Pulmonic valve regurgitation is not visualized. Aorta: The aortic root is normal in size and structure. IAS/Shunts: The interatrial septum was not well visualized.  LEFT VENTRICLE PLAX 2D LVIDd:         5.60 cm LVIDs:         3.10 cm LV PW:         1.30 cm LV IVS:        1.20 cm LVOT diam:     2.20 cm LVOT Area:     3.80 cm  LEFT ATRIUM         Index LA diam:    3.00 cm 1.42 cm/m   AORTA Ao Root diam: 3.00 cm TRICUSPID VALVE TR Peak grad:   28.9 mmHg TR Vmax:        269.00 cm/s  SHUNTS Systemic Diam: 2.20 cm Lyman Bishop MD Electronically signed by Lyman Bishop MD Signature Date/Time: 04/11/2019/4:16:37 PM    Final    VAS [place] LOWER EXTREMITY VENOUS (DVT)  Result Date: 04/11/2019  Lower Venous DVTStudy Indications: Swelling, and Pain.  Limitations: Poor ultrasound/tissue interface, body habitus and rt bka. Comparison Study: 01/26/18 previous Performing Technologist: Abram Sander RVS  Examination Guidelines: A complete evaluation includes B-mode imaging, spectral Doppler, color Doppler, and power Doppler as needed of all accessible portions of each vessel. Bilateral testing is considered an integral part of a complete examination. Limited examinations for reoccurring indications may be performed as noted. The reflux portion of the exam is performed with the patient in reverse Trendelenburg.  +---------+---------------+---------+-----------+----------+--------------+ RIGHT    CompressibilityPhasicitySpontaneityPropertiesThrombus Aging +---------+---------------+---------+-----------+----------+--------------+ CFV      Full           Yes      Yes                                 +---------+---------------+---------+-----------+----------+--------------+ SFJ      Full                                                        +---------+---------------+---------+-----------+----------+--------------+ FV Prox  Full                                                         +---------+---------------+---------+-----------+----------+--------------+ FV Mid                  Yes      Yes                                 +---------+---------------+---------+-----------+----------+--------------+ FV Distal                                             Not visualized +---------+---------------+---------+-----------+----------+--------------+ PFV  Full                                                        +---------+---------------+---------+-----------+----------+--------------+ POP      Full           Yes      Yes                                 +---------+---------------+---------+-----------+----------+--------------+ PTV                                                   bka            +---------+---------------+---------+-----------+----------+--------------+ PERO                                                  bka            +---------+---------------+---------+-----------+----------+--------------+   +---------+---------------+---------+-----------+----------+--------------+ LEFT     CompressibilityPhasicitySpontaneityPropertiesThrombus Aging +---------+---------------+---------+-----------+----------+--------------+ CFV      Full           Yes      Yes                                 +---------+---------------+---------+-----------+----------+--------------+ SFJ      Full                                                        +---------+---------------+---------+-----------+----------+--------------+ FV Prox  Full                                                        +---------+---------------+---------+-----------+----------+--------------+ FV Mid                  Yes      Yes                                 +---------+---------------+---------+-----------+----------+--------------+ FV Distal               Yes      Yes                                  +---------+---------------+---------+-----------+----------+--------------+ PFV      Full                                                        +---------+---------------+---------+-----------+----------+--------------+  POP      Full           Yes      Yes                                 +---------+---------------+---------+-----------+----------+--------------+ PTV      Full                                                        +---------+---------------+---------+-----------+----------+--------------+     Summary: RIGHT: - There is no evidence of deep vein thrombosis in the lower extremity. However, portions of this examination were limited- see technologist comments above.  - No cystic structure found in the popliteal fossa.  LEFT: - There is no evidence of deep vein thrombosis in the lower extremity.  - No cystic structure found in the popliteal fossa.  *See table(s) above for measurements and observations. Electronically signed by Servando Snare MD on 04/11/2019 at 5:12:48 PM.    Final     Microbiology: Recent Results (from the past 240 hour(s))  Urine culture     Status: Abnormal   Collection Time: 04/11/19  6:08 AM   Specimen: Urine, Random  Result Value Ref Range Status   Specimen Description URINE, RANDOM  Final   Special Requests NONE  Final   Culture (A)  Final    >=100,000 COLONIES/mL ESCHERICHIA COLI SUSCEPTIBILITIES TO FOLLOW Performed at [facility], [address] [place], [place] [address]    Report Status 04/13/2019 FINAL  Final   Organism ID, Bacteria ESCHERICHIA COLI (A)  Final      Susceptibility   Escherichia coli - MIC*    AMPICILLIN <=2 SENSITIVE Sensitive     CEFAZOLIN <=4 SENSITIVE Sensitive     CEFTRIAXONE <=0.25 SENSITIVE Sensitive     CIPROFLOXACIN <=0.25 SENSITIVE Sensitive     GENTAMICIN <=1 SENSITIVE Sensitive     IMIPENEM <=0.25 SENSITIVE Sensitive     NITROFURANTOIN <=16 SENSITIVE Sensitive     TRIMETH/SULFA <=20 SENSITIVE  Sensitive     AMPICILLIN/SULBACTAM <=2 SENSITIVE Sensitive     PIP/TAZO <=4 SENSITIVE Sensitive     * >=100,000 COLONIES/mL ESCHERICHIA COLI  SARS CORONAVIRUS 2 (TAT 6-24 HRS) Nasopharyngeal Nasopharyngeal Swab     Status: None   Collection Time: 04/11/19  6:26 AM   Specimen: Nasopharyngeal Swab  Result Value Ref Range Status   SARS Coronavirus 2 NEGATIVE NEGATIVE Final    Comment: (NOTE) SARS-CoV-2 target nucleic acids are NOT DETECTED. The SARS-CoV-2 RNA is generally detectable in upper and lower respiratory specimens during the acute phase of infection. Negative results do not preclude SARS-CoV-2 infection, do not rule out co-infections with other pathogens, and should not be used as the sole basis for treatment or other patient management decisions. Negative results must be combined with clinical observations, patient history, and epidemiological information. The expected result is Negative. Fact Sheet for Patients: SugarRoll.be Fact Sheet for Healthcare Providers: https://www.woods-mathews.com/ This test is not yet approved or cleared by the [place] FDA and  has been authorized for detection and/or diagnosis of SARS-CoV-2 by FDA under an Emergency Use Authorization (EUA). This EUA will remain  in effect (meaning this test can be used) for the duration of the COVID-19 declaration under Section 56  4(b)(1) of the Act, 21 U.S.C. section 360bbb-3(b)(1), unless the authorization is terminated or revoked sooner. Performed at [facility], [place] [address] [place], [place] [address]      Labs: Basic Metabolic Panel: Recent Labs  Lab 04/15/19 0418 04/16/19 0542 04/17/19 0500 04/18/19 0445 04/19/19 0545  NA 136 135 135 134* 134*  K 4.3 4.1 4.1 4.2 4.3  CL 98 99 101 99 100  CO2 _0 GLUCOSE 151* 144* 110* 142* 135*  BUN 36* 43* 46* 51* 52*  CREATININE 1.45* 1.70* 1.50* 1.38* 1.52*  CALCIUM 8.6* 8.8* 8.5* 8.7*  8.6*   Liver Function Tests: No results for input(s): AST, ALT, ALKPHOS, BILITOT, PROT, ALBUMIN in the last 168 hours. No results for input(s): LIPASE, AMYLASE in the last 168 hours. No results for input(s): AMMONIA in the last 168 hours. CBC: Recent Labs  Lab 04/15/19 1406 04/16/19 0827 04/19/19 0545  WBC 3.2* 3.2* 3.5*  NEUTROABS  --  1.9 2.1  HGB 10.3* 9.8* 9.8*  HCT 34.6* 33.2* 32.4*  MCV 89.4 91.2 88.5  PLT 191 124* 71*   Cardiac Enzymes: No results for input(s): CKTOTAL, CKMB, CKMBINDEX, TROPONINI in the last 168 hours. BNP: BNP (last 3 results) Recent Labs    04/11/19 0351 04/14/19 0842 04/15/19 0418  BNP 912.4* 452.2* 415.0*    ProBNP (last 3 results) No results for input(s): PROBNP in the last 8760 hours.  CBG: Recent Labs  Lab 04/18/19 0602 04/18/19 1214 04/18/19 1653 04/18/19 2132 04/19/19 0633  GLUCAP 125* 130* 125* 203* 125*       Signed:  Florencia Reasons MD, PhD, FACP  [facility] 04/19/2019, 10:34 AM

## 2019-04-19 NOTE — Progress Notes (Signed)
Patient sleeping during shift report.      

## 2019-04-23 ENCOUNTER — Ambulatory Visit: Payer: Medicare Other | Admitting: Nurse Practitioner

## 2019-04-24 ENCOUNTER — Other Ambulatory Visit: Payer: Self-pay

## 2019-04-24 DIAGNOSIS — D696 Thrombocytopenia, unspecified: Secondary | ICD-10-CM

## 2019-04-24 DIAGNOSIS — D693 Immune thrombocytopenic purpura: Secondary | ICD-10-CM

## 2019-04-25 ENCOUNTER — Inpatient Hospital Stay: Payer: Medicare Other

## 2019-04-25 ENCOUNTER — Inpatient Hospital Stay: Payer: Medicare Other | Admitting: Hematology and Oncology

## 2019-04-25 ENCOUNTER — Telehealth: Payer: Self-pay

## 2019-04-25 NOTE — Telephone Encounter (Signed)
RN placed call to follow up with missed appointment from 4/21.  LM for return call.

## 2019-04-25 NOTE — Assessment & Plan Note (Deleted)
Acute exacerbations of ITP:  11/07/2018: Dexamethasone and IVIG administered: Resolved Relapsed ITP 12/18/2018; IVIG treatment 01/24/2019 and 01/25/2019  Hospitalization: 04/10/2019 to 04/19/2019: Acute exacerbation of CHF Lab review 04/25/2019: Platelets 71 (on the day of discharge) 04/25/2019: Platelets

## 2019-04-30 ENCOUNTER — Ambulatory Visit (INDEPENDENT_AMBULATORY_CARE_PROVIDER_SITE_OTHER): Payer: Medicare Other | Admitting: Nurse Practitioner

## 2019-04-30 ENCOUNTER — Other Ambulatory Visit: Payer: Self-pay

## 2019-04-30 ENCOUNTER — Encounter: Payer: Self-pay | Admitting: Nurse Practitioner

## 2019-04-30 VITALS — BP 159/62 | HR 82 | Ht 66.0 in | Wt 235.0 lb

## 2019-04-30 DIAGNOSIS — I5031 Acute diastolic (congestive) heart failure: Secondary | ICD-10-CM | POA: Diagnosis not present

## 2019-04-30 DIAGNOSIS — N170 Acute kidney failure with tubular necrosis: Secondary | ICD-10-CM | POA: Diagnosis not present

## 2019-04-30 DIAGNOSIS — R0600 Dyspnea, unspecified: Secondary | ICD-10-CM

## 2019-04-30 DIAGNOSIS — E1122 Type 2 diabetes mellitus with diabetic chronic kidney disease: Secondary | ICD-10-CM

## 2019-04-30 DIAGNOSIS — N183 Chronic kidney disease, stage 3 unspecified: Secondary | ICD-10-CM

## 2019-04-30 DIAGNOSIS — Z89511 Acquired absence of right leg below knee: Secondary | ICD-10-CM

## 2019-04-30 DIAGNOSIS — N1831 Chronic kidney disease, stage 3a: Secondary | ICD-10-CM

## 2019-04-30 DIAGNOSIS — E1121 Type 2 diabetes mellitus with diabetic nephropathy: Secondary | ICD-10-CM

## 2019-04-30 DIAGNOSIS — Z0000 Encounter for general adult medical examination without abnormal findings: Secondary | ICD-10-CM

## 2019-04-30 NOTE — Patient Instructions (Addendum)
Edema  Edema is when you have too much fluid in your body or under your skin. Edema may make your legs, feet, and ankles swell up. Swelling is also common in looser tissues, like around your eyes. This is a common condition. It gets more common as you get older. There are many possible causes of edema. Eating too much salt (sodium) and being on your feet or sitting for a long time can cause edema in your legs, feet, and ankles. Hot weather may make edema worse. Edema is usually painless. Your skin may look swollen or shiny. Follow these instructions at home:  Keep the swollen body part raised (elevated) above the level of your heart when you are sitting or lying down.  Do not sit still or stand for a long time.  Do not wear tight clothes. Do not wear garters on your upper legs.  Exercise your legs. This can help the swelling go down.  Wear elastic bandages or support stockings as told by your doctor.  Eat a low-salt (low-sodium) diet to reduce fluid as told by your doctor.  Depending on the cause of your swelling, you may need to limit how much fluid you drink (fluid restriction).  Take over-the-counter and prescription medicines only as told by your doctor. Contact a doctor if:  Treatment is not working.  You have heart, liver, or kidney disease and have symptoms of edema.  You have sudden and unexplained weight gain. Get help right away if:  You have shortness of breath or chest pain.  You cannot breathe when you lie down.  You have pain, redness, or warmth in the swollen areas.  You have heart, liver, or kidney disease and get edema all of a sudden.  You have a fever and your symptoms get worse all of a sudden. Summary  Edema is when you have too much fluid in your body or under your skin.  Edema may make your legs, feet, and ankles swell up. Swelling is also common in looser tissues, like around your eyes.  Raise (elevate) the swollen body part above the level of your  heart when you are sitting or lying down.  Follow your doctor's instructions about diet and how much fluid you can drink (fluid restriction). This information is not intended to replace advice given to you by your health care provider. Make sure you discuss any questions you have with your health care provider. Document Revised: 12/24/2016 Document Reviewed: 01/09/2016 Elsevier Patient Education  2020 [employer]   Heart Failure, Self Care Heart failure is a serious condition. This sheet explains things you need to do to take care of yourself at home. To help you stay as healthy as possible, you may be asked to change your diet, take certain medicines, and make other changes in your life. Your doctor may also give you more specific instructions. If you have problems or questions, call your doctor. What are the risks? Having heart failure makes it more likely for you to have some problems. These problems can get worse if you do not take good care of yourself. Problems may include:  Blood clotting problems. This may cause a stroke.  Damage to the kidneys, liver, or lungs.  Abnormal heart rhythms. Supplies needed:  Scale for weighing yourself.  Blood pressure monitor.  Notebook.  Medicines. How to care for yourself when you have heart failure Medicines Take over-the-counter and prescription medicines only as told by your doctor. Take your medicines every day.  Do  not stop taking your medicine unless your doctor tells you to do so.  Do not skip any medicines.  Get your prescriptions refilled before you run out of medicine. This is important. Eating and drinking   Eat heart-healthy foods. Talk with a diet specialist (dietitian) to create an eating plan.  Choose foods that: ? Have no trans fat. ? Are low in saturated fat and cholesterol.  Choose healthy foods, such as: ? Fresh or frozen fruits and vegetables. ? Fish. ? Low-fat (lean) meats. ? Legumes, such as beans,  peas, and lentils. ? Fat-free or low-fat dairy products. ? Whole-grain foods. ? High-fiber foods.  Limit salt (sodium) if told by your doctor. Ask your diet specialist to tell you which seasonings are healthy for your heart.  Cook in healthy ways instead of frying. Healthy ways of cooking include roasting, grilling, broiling, baking, poaching, steaming, and stir-frying.  Limit how much fluid you drink, if told by your doctor. Alcohol use  Do not drink alcohol if: ? Your doctor tells you not to drink. ? Your heart was damaged by alcohol, or you have very bad heart failure. ? You are pregnant, may be pregnant, or are planning to become pregnant.  If you drink alcohol: ? Limit how much you use to:  0-1 drink a day for women.  0-2 drinks a day for men. ? Be aware of how much alcohol is in your drink. In the [place] one drink equals one 12 oz bottle of beer (355 mL), one 5 oz glass of wine (148 mL), or one 1 oz glass of hard liquor (44 mL). Lifestyle   Do not use any products that contain nicotine or tobacco, such as cigarettes, e-cigarettes, and chewing tobacco. If you need help quitting, ask your doctor. ? Do not use nicotine gum or patches before talking to your doctor.  Do not use illegal drugs.  Lose weight if told by your doctor.  Do physical activity if told by your doctor. Talk to your doctor before you begin an exercise if: ? You are an older adult. ? You have very bad heart failure.  Learn to manage stress. If you need help, ask your doctor.  Get rehab (rehabilitation) to help you stay independent and to help with your quality of life.  Plan time to rest when you get tired. Check weight and blood pressure   Weigh yourself every day. This will help you to know if fluid is building up in your body. ? Weigh yourself every morning after you pee (urinate) and before you eat breakfast. ? Wear the same amount of clothing each time. ? Write down your daily weight. Give  your record to your doctor.  Check and write down your blood pressure as told by your doctor.  Check your pulse as told by your doctor. Dealing with very hot and very cold weather  If it is very hot: ? Avoid activities that take a lot of energy. ? Use air conditioning or fans, or find a cooler place. ? Avoid caffeine and alcohol. ? Wear clothing that is loose-fitting, lightweight, and light-colored.  If it is very cold: ? Avoid activities that take a lot of energy. ? Layer your clothes. ? Wear mittens or gloves, a hat, and a scarf when you go outside. ? Avoid alcohol. Follow these instructions at home:  Stay up to date with shots (vaccines). Get pneumococcal and flu (influenza) shots.  Keep all follow-up visits as told by your doctor. This is  important. Contact a doctor if:  You gain weight quickly.  You have increasing shortness of breath.  You cannot do your normal activities.  You get tired easily.  You cough a lot.  You don't feel like eating or feel like you may vomit (nauseous).  You become puffy (swell) in your hands, feet, ankles, or belly (abdomen).  You cannot sleep well because it is hard to breathe.  You feel like your heart is beating fast (palpitations).  You get dizzy when you stand up. Get help right away if:  You have trouble breathing.  You or someone else notices a change in your behavior, such as having trouble staying awake.  You have chest pain or discomfort.  You pass out (faint). These symptoms may be an emergency. Do not wait to see if the symptoms will go away. Get medical help right away. Call your local emergency services (911 in the [place] Do not drive yourself to the hospital. Summary  Heart failure is a serious condition. To care for yourself, you may have to change your diet, take medicines, and make other lifestyle changes.  Take your medicines every day. Do not stop taking them unless your doctor tells you to do so.  Eat  heart-healthy foods, such as fresh or frozen fruits and vegetables, fish, lean meats, legumes, fat-free or low-fat dairy products, and whole-grain or high-fiber foods.  Ask your doctor if you can drink alcohol. You may have to stop alcohol use if you have very bad heart failure.  Contact your doctor if you gain weight quickly or feel that your heart is beating too fast. Get help right away if you pass out, or have chest pain or trouble breathing. This information is not intended to replace advice given to you by your health care provider. Make sure you discuss any questions you have with your health care provider. Document Revised: 04/04/2018 Document Reviewed: 04/05/2018 Elsevier Patient Education  2020 Else[employer] Inc.  Low-Sodium Eating Plan Sodium, which is an element that makes up salt, helps you maintain a healthy balance of fluids in your body. Too much sodium can increase your blood pressure and cause fluid and waste to be held in your body. Your health care provider or dietitian may recommend following this plan if you have high blood pressure (hypertension), kidney disease, liver disease, or heart failure. Eating less sodium can help lower your blood pressure, reduce swelling, and protect your heart, liver, and kidneys. What are tips for following this plan? General guidelines  Most people on this plan should limit their sodium intake to 1,500-2,000 mg (milligrams) of sodium each day. Reading food labels   The Nutrition Facts label lists the amount of sodium in one serving of the food. If you eat more than one serving, you must multiply the listed amount of sodium by the number of servings.  Choose foods with less than 140 mg of sodium per serving.  Avoid foods with 300 mg of sodium or more per serving. Shopping  Look for lower-sodium products, often labeled as "low-sodium" or "no salt added."  Always check the sodium content even if foods are labeled as "unsalted" or "no salt  added".  Buy fresh foods. ? Avoid canned foods and premade or frozen meals. ? Avoid canned, cured, or processed meats  Buy breads that have less than 80 mg of sodium per slice. Cooking  Eat more home-cooked food and less restaurant, buffet, and fast food.  Avoid adding salt when cooking. Use salt-free  seasonings or herbs instead of table salt or sea salt. Check with your health care provider or pharmacist before using salt substitutes.  Cook with plant-based oils, such as canola, sunflower, or olive oil. Meal planning  When eating at a restaurant, ask that your food be prepared with less salt or no salt, if possible.  Avoid foods that contain MSG (monosodium glutamate). MSG is sometimes added to [place] food, bouillon, and some canned foods. What foods are recommended? The items listed may not be a complete list. Talk with your dietitian about what dietary choices are best for you. Grains Low-sodium cereals, including oats, puffed wheat and rice, and shredded wheat. Low-sodium crackers. Unsalted rice. Unsalted pasta. Low-sodium bread. Whole-grain breads and whole-grain pasta. Vegetables Fresh or frozen vegetables. "No salt added" canned vegetables. "No salt added" tomato sauce and paste. Low-sodium or reduced-sodium tomato and vegetable juice. Fruits Fresh, frozen, or canned fruit. Fruit juice. Meats and other protein foods Fresh or frozen (no salt added) meat, poultry, seafood, and fish. Low-sodium canned tuna and salmon. Unsalted nuts. Dried peas, beans, and lentils without added salt. Unsalted canned beans. Eggs. Unsalted nut butters. Dairy Milk. Soy milk. Cheese that is naturally low in sodium, such as ricotta cheese, fresh mozzarella, or Swiss cheese Low-sodium or reduced-sodium cheese. Cream cheese. Yogurt. Fats and oils Unsalted butter. Unsalted margarine with no trans fat. Vegetable oils such as canola or olive oils. Seasonings and other foods Fresh and dried herbs and  spices. Salt-free seasonings. Low-sodium mustard and ketchup. Sodium-free salad dressing. Sodium-free light mayonnaise. Fresh or refrigerated horseradish. Lemon juice. Vinegar. Homemade, reduced-sodium, or low-sodium soups. Unsalted popcorn and pretzels. Low-salt or salt-free chips. What foods are not recommended? The items listed may not be a complete list. Talk with your dietitian about what dietary choices are best for you. Grains Instant hot cereals. Bread stuffing, pancake, and biscuit mixes. Croutons. Seasoned rice or pasta mixes. Noodle soup cups. Boxed or frozen macaroni and cheese. Regular salted crackers. Self-rising flour. Vegetables Sauerkraut, pickled vegetables, and relishes. Olives. [place] fries. Onion rings. Regular canned vegetables (not low-sodium or reduced-sodium). Regular canned tomato sauce and paste (not low-sodium or reduced-sodium). Regular tomato and vegetable juice (not low-sodium or reduced-sodium). Frozen vegetables in sauces. Meats and other protein foods Meat or fish that is salted, canned, smoked, spiced, or pickled. Bacon, ham, sausage, hotdogs, corned beef, chipped beef, packaged lunch meats, salt pork, jerky, pickled herring, anchovies, regular canned tuna, sardines, salted nuts. Dairy Processed cheese and cheese spreads. Cheese curds. Blue cheese. Feta cheese. String cheese. Regular cottage cheese. Buttermilk. Canned milk. Fats and oils Salted butter. Regular margarine. Ghee. Bacon fat. Seasonings and other foods Onion salt, garlic salt, seasoned salt, table salt, and sea salt. Canned and packaged gravies. Worcestershire sauce. Tartar sauce. Barbecue sauce. Teriyaki sauce. Soy sauce, including reduced-sodium. Steak sauce. Fish sauce. Oyster sauce. Cocktail sauce. Horseradish that you find on the shelf. Regular ketchup and mustard. Meat flavorings and tenderizers. Bouillon cubes. Hot sauce and Tabasco sauce. Premade or packaged marinades. Premade or packaged taco  seasonings. Relishes. Regular salad dressings. Salsa. Potato and tortilla chips. Corn chips and puffs. Salted popcorn and pretzels. Canned or dried soups. Pizza. Frozen entrees and pot pies. Summary  Eating less sodium can help lower your blood pressure, reduce swelling, and protect your heart, liver, and kidneys.  Most people on this plan should limit their sodium intake to 1,500-2,000 mg (milligrams) of sodium each day.  Canned, boxed, and frozen foods are high in sodium. Restaurant foods,  fast foods, and pizza are also very high in sodium. You also get sodium by adding salt to food.  Try to cook at home, eat more fresh fruits and vegetables, and eat less fast food, canned, processed, or prepared foods. This information is not intended to replace advice given to you by your health care provider. Make sure you discuss any questions you have with your health care provider. Document Revised: 12/03/2016 Document Reviewed: 12/15/2015 Elsevier Patient Education  2020 [place].   Acute Kidney Injury, Adult  Acute kidney injury is a sudden worsening of kidney function. The kidneys are organs that have several jobs. They filter the blood to remove waste products and extra fluid. They also maintain a healthy balance of minerals and hormones in the body, which helps control blood pressure and keep bones strong. With this condition, your kidneys do not do their jobs as well as they should. This condition ranges from mild to severe. Over time it may develop into long-lasting (chronic) kidney disease. Early detection and treatment may prevent acute kidney injury from developing into a chronic condition. What are the causes? Common causes of this condition include:  A problem with blood flow to the kidneys. This may be caused by: ? Low blood pressure (hypotension) or shock. ? Blood loss. ? Heart and blood vessel (cardiovascular) disease. ? Severe burns. ? Liver disease.  Direct damage to the  kidneys. This may be caused by: ? Certain medicines. ? A kidney infection. ? Poisoning. ? Being around or in contact with toxic substances. ? A surgical wound. ? A hard, direct hit to the kidney area.  A sudden blockage of urine flow. This may be caused by: ? Cancer. ? Kidney stones. ? An enlarged prostate in males. What are the signs or symptoms? Symptoms of this condition may not be obvious until the condition becomes severe. Symptoms of this condition can include:  Tiredness (lethargy), or difficulty staying awake.  Nausea or vomiting.  Swelling (edema) of the face, legs, ankles, or feet.  Problems with urination, such as: ? Abdominal pain, or pain along the side of your stomach (flank). ? Decreased urine production. ? Decrease in the force of urine flow.  Muscle twitches and cramps, especially in the legs.  Confusion or trouble concentrating.  Loss of appetite.  Fever. How is this diagnosed? This condition may be diagnosed with tests, including:  Blood tests.  Urine tests.  Imaging tests.  A test in which a sample of tissue is removed from the kidneys to be examined under a microscope (kidney biopsy). How is this treated? Treatment for this condition depends on the cause and how severe the condition is. In mild cases, treatment may not be needed. The kidneys may heal on their own. In more severe cases, treatment will involve:  Treating the cause of the kidney injury. This may involve changing any medicines you are taking or adjusting your dosage.  Fluids. You may need specialized IV fluids to balance your body's needs.  Having a catheter placed to drain urine and prevent blockages.  Preventing problems from occurring. This may mean avoiding certain medicines or procedures that can cause further injury to the kidneys. In some cases treatment may also require:  A procedure to remove toxic wastes from the body (dialysis or continuous renal replacement therapy -  CRRT).  Surgery. This may be done to repair a torn kidney, or to remove the blockage from the urinary system. Follow these instructions at home: Medicines  Take  over-the-counter and prescription medicines only as told by your health care provider.  Do not take any new medicines without your health care provider's approval. Many medicines can worsen your kidney damage.  Do not take any vitamin and mineral supplements without your health care provider's approval. Many nutritional supplements can worsen your kidney damage. Lifestyle  If your health care provider prescribed changes to your diet, follow them. You may need to decrease the amount of protein you eat.  Achieve and maintain a healthy weight. If you need help with this, ask your health care provider.  Start or continue an exercise plan. Try to exercise at least 30 minutes a day, 5 days a week.  Do not use any tobacco products, such as cigarettes, chewing tobacco, and e-cigarettes. If you need help quitting, ask your health care provider. General instructions  Keep track of your blood pressure. Report changes in your blood pressure as told by your health care provider.  Stay up to date with immunizations. Ask your health care provider which immunizations you need.  Keep all follow-up visits as told by your health care provider. This is important. Where to find more information  American Association of Kidney Patients: ResidentialShow.is  [employer]: www.kidney.org  [employer]: FightingMatch.com.ee  [facility]: ? www.lifeoptions.org ? www.kidneyschool.org Contact a health care provider if:  Your symptoms get worse.  You develop new symptoms. Get help right away if:  You develop symptoms of worsening kidney disease, which include: ? Headaches. ? Abnormally dark or light skin. ? Easy bruising. ? Frequent hiccups. ? Chest pain. ? Shortness of breath. ? End of menstruation in  women. ? Seizures. ? Confusion or altered mental status. ? Abdominal or back pain. ? Itchiness.  You have a fever.  Your body is producing less urine.  You have pain or bleeding when you urinate. Summary  Acute kidney injury is a sudden worsening of kidney function.  Acute kidney injury can be caused by problems with blood flow to the kidneys, direct damage to the kidneys, and sudden blockage of urine flow.  Symptoms of this condition may not be obvious until it becomes severe. Symptoms may include edema, lethargy, confusion, nausea or vomiting, and problems passing urine.  This condition can usually be diagnosed with blood tests, urine tests, and imaging tests. Sometimes a kidney biopsy is done to diagnose this condition.  Treatment for this condition often involves treating the underlying cause. It is treated with fluids, medicines, dialysis, diet changes, or surgery. This information is not intended to replace advice given to you by your health care provider. Make sure you discuss any questions you have with your health care provider. Document Revised: 12/03/2016 Document Reviewed: 12/12/2015 Elsevier Patient Education  2020 [employer].

## 2019-04-30 NOTE — Progress Notes (Signed)
[place], [place]  [address] Phone:  [phone]   Fax:  [phone]   Established Patient Office Visit  Subjective:  Patient ID: [name], female    DOB: May 14, 1965  Age: 54 y.o. MRN: [id number]  CC:  Chief Complaint  Patient presents with  . New Patient (Initial Visit)    ed follow up having swelling in leg,told that she chf     HPI [name] presents to establish care. She was admitted to the hospital on 04/10/19-04/19/19 She  has a past medical history of Acute exacerbation of CHF (congestive heart failure) ([place]) (04/11/2019), Anemia, Arthritis, Back pain, Chronic indwelling Foley catheter (03/04/2017), Diabetic foot ulcer associated with type 2 diabetes mellitus ([place]), Hypertension, Intracerebral hemorrhage ([place]) (As a teenager ), Neuropathy, Stroke ([place]) (~ 1982), and Type II diabetes mellitus ([place]).    Congestive Heart Failure Patient presents for re-evaluation of congestive heart failure. Patient's current complaints are fatigue and lower extremity edema. She denies chest pain, dyspnea, exertional chest pressure/discomfort, irregular heart beat, near-syncope, orthopnea, palpitations and syncope. She states she is compliant most of the time with her medications. She states she is getting some assistance from her home health nurse with her diet. She lives alone  and has some limitations with her ADLs related to her increased lower extremity edema has limited her ability to move about.  with her diet. She is not able to monitor her daily weight at this time.   Diabetes Mellitus Patient presents for follow up of diabetes. Current symptoms include: visual disturbances. Symptoms have stabilized. Patient denies hypoglycemia , increased appetite, nausea, polydipsia, polyuria and vomiting. Evaluation to date has included: fasting blood sugar, fasting lipid panel and hemoglobin A1C.   Current treatment: Continued metformin which has  been effective. Last dilated eye exam: 2020 will follow up soon awaiting to reschedule cataract surgery..  Past Medical History:  Diagnosis Date  . Acute exacerbation of CHF (congestive heart failure) ([place]) 04/11/2019  . Anemia   . Arthritis    "spine, hands" (11/25/2015)  . Back pain    "all my back; probably 3 times/week" (11/25/2015)  . Chronic indwelling Foley catheter 03/04/2017  . Diabetic foot ulcer associated with type 2 diabetes mellitus ([place])   . Hypertension   . Intracerebral hemorrhage ([place]) As a teenager    States she had burst blood vessel as teenager, now with resultant minor visual field and hearing deficits  . Neuropathy   . Stroke [facility]) ~ 1982   "my feeling on my RLE, right lower eye vision,  & hearing out of my right ear not the same since" (11/25/2015)  . Type II diabetes mellitus ([place])     Past Surgical History:  Procedure Laterality Date  . AMPUTATION  11/24/2010   Procedure: AMPUTATION FOOT;  Surgeon: Wylene Simmer, MD;  Location: [place];  Service: Orthopedics;  Laterality: Right;  Right  FOOT TRANS METATARSAL AMPUTATION  . AMPUTATION  07/02/2011   Procedure: AMPUTATION BELOW KNEE;  Surgeon: Wylene Simmer, MD;  Location: [facility];  Service: Orthopedics;  Laterality: Right;  . APPLICATION OF WOUND VAC  07/02/2011   Procedure: APPLICATION OF WOUND VAC;  Surgeon: Wylene Simmer, MD;  Location: [place];  Service: Orthopedics;  Laterality: Right;  . BRAIN SURGERY  1980's   Previous brain surgery in [place], [place]  . CESAREAN SECTION  2004; 2006  . COLONOSCOPY N/A 09/17/2015   Procedure: COLONOSCOPY;  Surgeon: Wilford Corner, MD;  [facility];  Service: Endoscopy;  Laterality: N/A;  . ESOPHAGOGASTRODUODENOSCOPY N/A 09/17/2015   Procedure: ESOPHAGOGASTRODUODENOSCOPY (EGD);  Surgeon: Wilford Corner, MD;  Location: [facility] ENDOSCOPY;  Service: Endoscopy;  Laterality: N/A;  . I & D EXTREMITY  11/24/2010   Procedure: IRRIGATION AND DEBRIDEMENT EXTREMITY;  Surgeon: Wylene Simmer,  MD;  Location: [facility];  Service: Orthopedics;  Laterality: Left;  Debriedment of left plantar ulcer and trimming of toenails  . I & D EXTREMITY  07/02/2011   Procedure: IRRIGATION AND DEBRIDEMENT EXTREMITY;  Surgeon: Wylene Simmer, MD;  Location: [facility];  Service: Orthopedics;  Laterality: Left;  I & D of Left Foot  . I & D EXTREMITY  07/06/2011   Procedure: IRRIGATION AND DEBRIDEMENT EXTREMITY;  Surgeon: Wylene Simmer, MD;  Location: [place];  Service: Orthopedics;  Laterality: Right;  IRRIGATION/DEBRIDEMENT RIGHT BELOW KNEE AMPUTATION  . TUBAL LIGATION  2006    Family History  Problem Relation Age of Onset  . Diabetes Mother   . Hypertension Mother   . Hyperlipidemia Mother   . Diabetes Father   . Cancer Father        prostate  . Hyperlipidemia Father   . Hypertension Father   . Diabetes Brother   . Peripheral Artery Disease Brother        has had toes amputated  . Diabetes Son     Social History   Socioeconomic History  . Marital status: Widowed    Spouse name: Not on file  . Number of children: Not on file  . Years of education: Not on file  . Highest education level: Not on file  Occupational History  . Not on file  Tobacco Use  . Smoking status: Never Smoker  . Smokeless tobacco: Never Used  Substance and Sexual Activity  . Alcohol use: Yes    Alcohol/week: 2.0 standard drinks    Types: 2 Glasses of wine per week  . Drug use: No  . Sexual activity: Not Currently    Birth control/protection: Post-menopausal  Other Topics Concern  . Not on file  Social History Narrative   Widowed in 2012   Lives in a home she owns   No wheelchair ramp, though garage entry is flush to both garage and entry floor.   Unable to use full bathroom upstairs.   Has prosthesis for right LE, but never learned how to use.   Cannot see well due to cataracts.   Did not receive disability in 07/261 with application.   Possibly due to husband's SS she receives   Social Determinants of Adult nurse Strain:   . Difficulty of Paying Living Expenses:   Food Insecurity:   . Worried About Charity fundraiser in the Last Year:   . Arboriculturist in the Last Year:   Transportation Needs:   . Film/video editor (Medical):   Marland Kitchen Lack of Transportation (Non-Medical):   Physical Activity:   . Days of Exercise per Week:   . Minutes of Exercise per Session:   Stress:   . Feeling of Stress :   Social Connections:   . Frequency of Communication with Friends and Family:   . Frequency of Social Gatherings with Friends and Family:   . Attends Religious Services:   . Active Member of Clubs or Organizations:   . Attends Archivist Meetings:   Marland Kitchen Marital Status:   Intimate Partner Violence:   . Fear of Current or Ex-Partner:   .  Emotionally Abused:   Marland Kitchen Physically Abused:   . Sexually Abused:     Outpatient Medications Prior to Visit  Medication Sig Dispense Refill  . acetaminophen (TYLENOL) 500 MG tablet Take 1,000 mg by mouth every 6 (six) hours as needed for mild pain.    . furosemide (LASIX) 40 MG tablet Take 1 tablet (40 mg total) by mouth 2 (two) times daily. 30 tablet 0  . gabapentin (NEURONTIN) 400 MG capsule Take 1 capsule (400 mg total) by mouth 2 (two) times daily. 60 capsule 0  . isosorbide mononitrate (IMDUR) 30 MG 24 hr tablet Take 1 tablet (30 mg total) by mouth daily. 30 tablet 0  . metFORMIN (GLUCOPHAGE) 500 MG tablet Take 1 tablet (500 mg total) by mouth 2 (two) times daily with a meal. (Patient taking differently: Take 500 mg by mouth daily with breakfast. ) 60 tablet 1  . metoprolol succinate (TOPROL-XL) 50 MG 24 hr tablet Take 1 tablet (50 mg total) by mouth daily. Take with or immediately following a meal. 60 tablet 0  . rosuvastatin (CRESTOR) 20 MG tablet Take 1 tablet (20 mg total) by mouth daily. 30 tablet 0   No facility-administered medications prior to visit.    No Known Allergies  ROS Review of Systems  HENT: Negative.    Eyes: Positive for visual disturbance.  Respiratory: Negative.   Cardiovascular: Positive for leg swelling.  Endocrine: Negative.   Allergic/Immunologic: Negative.   Neurological: Positive for weakness.      Objective:    Physical Exam  Constitutional: She is oriented to person, place, and time. She appears well-developed.  HENT:  Head: Normocephalic and atraumatic.  Eyes:  Visual limitation R>L  Cardiovascular: Normal rate, regular rhythm and normal heart sounds.  1-2 pitting edema  Pulmonary/Chest: Effort normal and breath sounds normal.  Musculoskeletal:        General: Tenderness and edema present.     Cervical back: Normal range of motion.     Comments: Right below the knee amputation  Neurological: She is alert and oriented to person, place, and time.  Skin: Skin is warm and dry. There is erythema.  Left lower leg Multiple dark indentation in skin   Psychiatric: She has a normal mood and affect. Her behavior is normal. Judgment and thought content normal.    BP (!) 159/62 (BP Location: Left Arm, Patient Position: Sitting, Cuff Size: Large)   Pulse 82   Ht '5\' 6"'  (1.676 m)   Wt 235 lb (106.6 kg)   LMP  (LMP Unknown)   SpO2 100%   BMI 37.93 kg/m  Wt Readings from Last 3 Encounters:  04/30/19 235 lb (106.6 kg)  04/19/19 235 lb 0.2 oz (106.6 kg)  11/13/18 228 lb 2.8 oz (103.5 kg)     Health Maintenance Due  Topic Date Due  . FOOT EXAM  Never done  . OPHTHALMOLOGY EXAM  Never done  . URINE MICROALBUMIN  Never done  . COVID-19 Vaccine (1) Never done  . TETANUS/TDAP  Never done  . PAP SMEAR-Modifier  03/02/2015  . MAMMOGRAM  Never done    There are no preventive care reminders to display for this patient.  Lab Results  Component Value Date   TSH 3.037 04/11/2019   Lab Results  Component Value Date   WBC 3.5 (L) 04/19/2019   HGB 9.8 (L) 04/19/2019   HCT 32.4 (L) 04/19/2019   MCV 88.5 04/19/2019   PLT 71 (L) 04/19/2019   Lab Results  Component  Value Date   NA 134 (L) 04/19/2019   K 4.3 04/19/2019   CHLORIDE 108 09/30/2015   CO2 23 04/19/2019   GLUCOSE 135 (H) 04/19/2019   BUN 52 (H) 04/19/2019   CREATININE 1.52 (H) 04/19/2019   BILITOT 0.7 04/11/2019   ALKPHOS 62 04/11/2019   AST 52 (H) 04/11/2019   ALT 8 04/11/2019   PROT 9.7 (H) 04/11/2019   ALBUMIN 3.2 (L) 04/11/2019   CALCIUM 8.6 (L) 04/19/2019   ANIONGAP 11 04/19/2019   EGFR 54 (L) 09/30/2015   Lab Results  Component Value Date   CHOL 160 11/02/2018   Lab Results  Component Value Date   HDL 62 11/02/2018   Lab Results  Component Value Date   LDLCALC 82 11/02/2018   Lab Results  Component Value Date   TRIG 89 11/02/2018   Lab Results  Component Value Date   CHOLHDL 3.1 08/26/2015   Lab Results  Component Value Date   HGBA1C 5.5 04/12/2019      Assessment & Plan:   Problem List Items Addressed This Visit      Unprioritized   Acute diastolic CHF (congestive heart failure) ([place])   Relevant Orders   Comp. Metabolic Panel (12)   Acute on chronic renal failure (HCC) - Primary   Relevant Orders   Comp. Metabolic Panel (12)   Diabetes mellitus ([place])   Relevant Orders   CBC with Differential/Platelet   Comp. Metabolic Panel (12)    Other Visit Diagnoses    Healthcare maintenance       Nocturnal dyspnea       Relevant Orders   PSG SLEEP STUDY      No orders of the defined types were placed in this encounter.   Follow-up: Return in about 2 months (around 06/30/2019).    Vevelyn Francois, NP

## 2019-05-01 LAB — COMP. METABOLIC PANEL (12)
AST: 29 [IU]/L (ref 0–40)
Albumin/Globulin Ratio: 1 — ABNORMAL LOW (ref 1.2–2.2)
Albumin: 4.1 g/dL (ref 3.8–4.9)
Alkaline Phosphatase: 82 [IU]/L (ref 39–117)
BUN/Creatinine Ratio: 17 (ref 9–23)
BUN: 27 mg/dL — ABNORMAL HIGH (ref 6–24)
Bilirubin Total: 0.4 mg/dL (ref 0.0–1.2)
Calcium: 8.8 mg/dL (ref 8.7–10.2)
Chloride: 104 mmol/L (ref 96–106)
Creatinine, Ser: 1.62 mg/dL — ABNORMAL HIGH (ref 0.57–1.00)
GFR calc Af Amer: 42 mL/min/{1.73_m2} — ABNORMAL LOW
GFR calc non Af Amer: 36 mL/min/{1.73_m2} — ABNORMAL LOW
Globulin, Total: 4 g/dL (ref 1.5–4.5)
Glucose: 101 mg/dL — ABNORMAL HIGH (ref 65–99)
Potassium: 5.3 mmol/L — ABNORMAL HIGH (ref 3.5–5.2)
Sodium: 140 mmol/L (ref 134–144)
Total Protein: 8.1 g/dL (ref 6.0–8.5)

## 2019-05-01 LAB — CBC WITH DIFFERENTIAL/PLATELET
Basophils Absolute: 0 10*3/uL (ref 0.0–0.2)
Basos: 1 %
EOS (ABSOLUTE): 0.1 10*3/uL (ref 0.0–0.4)
Eos: 2 %
Hematocrit: 31.3 % — ABNORMAL LOW (ref 34.0–46.6)
Hemoglobin: 10 g/dL — ABNORMAL LOW (ref 11.1–15.9)
Immature Grans (Abs): 0 10*3/uL (ref 0.0–0.1)
Immature Granulocytes: 0 %
Lymphocytes Absolute: 1 10*3/uL (ref 0.7–3.1)
Lymphs: 19 %
MCH: 26.9 pg (ref 26.6–33.0)
MCHC: 31.9 g/dL (ref 31.5–35.7)
MCV: 84 fL (ref 79–97)
Monocytes Absolute: 0.4 10*3/uL (ref 0.1–0.9)
Monocytes: 8 %
Neutrophils Absolute: 3.9 10*3/uL (ref 1.4–7.0)
Neutrophils: 70 %
RBC: 3.72 x10E6/uL — ABNORMAL LOW (ref 3.77–5.28)
RDW: 15.6 % — ABNORMAL HIGH (ref 11.7–15.4)
WBC: 5.5 10*3/uL (ref 3.4–10.8)

## 2019-05-03 ENCOUNTER — Ambulatory Visit: Payer: Medicare Other | Admitting: Internal Medicine

## 2019-05-16 ENCOUNTER — Encounter: Payer: Self-pay | Admitting: Nurse Practitioner

## 2019-05-16 ENCOUNTER — Other Ambulatory Visit: Payer: Self-pay

## 2019-05-16 ENCOUNTER — Ambulatory Visit (HOSPITAL_COMMUNITY)
Admission: RE | Admit: 2019-05-16 | Discharge: 2019-05-16 | Disposition: A | Discharge status: Home / Self Care | Payer: Medicare Other | Source: Ambulatory Visit | Attending: Nurse Practitioner | Admitting: Nurse Practitioner

## 2019-05-16 ENCOUNTER — Ambulatory Visit (INDEPENDENT_AMBULATORY_CARE_PROVIDER_SITE_OTHER): Payer: Medicare Other | Admitting: Nurse Practitioner

## 2019-05-16 VITALS — BP 147/73 | HR 84 | Temp 98.4°F

## 2019-05-16 DIAGNOSIS — R0989 Other specified symptoms and signs involving the circulatory and respiratory systems: Secondary | ICD-10-CM

## 2019-05-16 DIAGNOSIS — Z139 Encounter for screening, unspecified: Secondary | ICD-10-CM | POA: Diagnosis present

## 2019-05-16 NOTE — Progress Notes (Signed)
[place], [place]  [address] Phone:  [phone]   Fax:  [phone]   Established Patient Office Visit  Subjective:  Patient ID: [name], female    DOB: 1965-11-22  Age: 54 y.o. MRN: [id number]  CC:  Chief Complaint  Patient presents with  . medical clearance    pt has form that needs to fill out sugery clearance for cartact sugery on tomorrow     HPI [name] presents for medical clearance for cataract surgery.. She  has a past medical history of Acute exacerbation of CHF (congestive heart failure) ([place]) (04/11/2019), Anemia, Arthritis, Back pain, Chronic indwelling Foley catheter (03/04/2017), Diabetic foot ulcer associated with type 2 diabetes mellitus ([place]), Hypertension, Intracerebral hemorrhage ([place]) (As a teenager ), Neuropathy, Stroke ([place]) (~ 1982), and Type II diabetes mellitus ([place]).   She admits that her left leg has improved with the swelling. She excited and nervous at the same time. She admits that she was encouaraged that the surgery will be effective. She admits that [facility] is continuing to prvide in home services.  Her blood glocuse thisam was 140   Past Medical History:  Diagnosis Date  . Acute exacerbation of CHF (congestive heart failure) ([place]) 04/11/2019  . Anemia   . Arthritis    "spine, hands" (11/25/2015)  . Back pain    "all my back; probably 3 times/week" (11/25/2015)  . Chronic indwelling Foley catheter 03/04/2017  . Diabetic foot ulcer associated with type 2 diabetes mellitus ([place])   . Hypertension   . Intracerebral hemorrhage ([place]) As a teenager    States she had burst blood vessel as teenager, now with resultant minor visual field and hearing deficits  . Neuropathy   . Stroke [facility]) ~ 1982   "my feeling on my RLE, right lower eye vision,  & hearing out of my right ear not the same since" (11/25/2015)  . Type II diabetes mellitus ([place])     Past Surgical History:    Procedure Laterality Date  . AMPUTATION  11/24/2010   Procedure: AMPUTATION FOOT;  Surgeon: Wylene Simmer, MD;  Location: [facility];  Service: Orthopedics;  Laterality: Right;  Right  FOOT TRANS METATARSAL AMPUTATION  . AMPUTATION  07/02/2011   Procedure: AMPUTATION BELOW KNEE;  Surgeon: Wylene Simmer, MD;  Location: [place];  Service: Orthopedics;  Laterality: Right;  . APPLICATION OF WOUND VAC  07/02/2011   Procedure: APPLICATION OF WOUND VAC;  Surgeon: Wylene Simmer, MD;  Location: [place] [place];  Service: Orthopedics;  Laterality: Right;  . BRAIN SURGERY  1980's   Previous brain surgery in [place], [place]  . CESAREAN SECTION  2004; 2006  . COLONOSCOPY N/A 09/17/2015   Procedure: COLONOSCOPY;  Surgeon: Wilford Corner, MD;  Location[facility] ENDOSCOPY;  Service: Endoscopy;  Laterality: N/A;  . ESOPHAGOGASTRODUODENOSCOPY N/A 09/17/2015   Procedure: ESOPHAGOGASTRODUODENOSCOPY (EGD);  Surgeon: Wilford Corner, MD;  Location: [facility] ENDOSCOPY;  Service: Endoscopy;  Laterality: N/A;  . I & D EXTREMITY  11/24/2010   Procedure: IRRIGATION AND DEBRIDEMENT EXTREMITY;  Surgeon: Wylene Simmer, MD;  Location: [place];  Service: Orthopedics;  Laterality: Left;  Debriedment of left plantar ulcer and trimming of toenails  . I & D EXTREMITY  07/02/2011   Procedure: IRRIGATION AND DEBRIDEMENT EXTREMITY;  Surgeon: Wylene Simmer, MD;  Location: [facility];  Service: Orthopedics;  Laterality: Left;  I & D of Left Foot  . I & D EXTREMITY  07/06/2011  Procedure: IRRIGATION AND DEBRIDEMENT EXTREMITY;  Surgeon: Wylene Simmer, MD;  Location: [place];  Service: Orthopedics;  Laterality: Right;  IRRIGATION/DEBRIDEMENT RIGHT BELOW KNEE AMPUTATION  . TUBAL LIGATION  2006    Family History  Problem Relation Age of Onset  . Diabetes Mother   . Hypertension Mother   . Hyperlipidemia Mother   . Diabetes Father   . Cancer Father        prostate  . Hyperlipidemia Father   . Hypertension Father   . Diabetes Brother   . Peripheral Artery Disease Brother         has had toes amputated  . Diabetes Son     Social History   Socioeconomic History  . Marital status: Widowed    Spouse name: Not on file  . Number of children: Not on file  . Years of education: Not on file  . Highest education level: Not on file  Occupational History  . Not on file  Tobacco Use  . Smoking status: Never Smoker  . Smokeless tobacco: Never Used  Substance and Sexual Activity  . Alcohol use: Yes    Alcohol/week: 2.0 standard drinks    Types: 2 Glasses of wine per week  . Drug use: No  . Sexual activity: Not Currently    Birth control/protection: Post-menopausal  Other Topics Concern  . Not on file  Social History Narrative   Widowed in 2012   Lives in a home she owns   No wheelchair ramp, though garage entry is flush to both garage and entry floor.   Unable to use full bathroom upstairs.   Has prosthesis for right LE, but never learned how to use.   Cannot see well due to cataracts.   Did not receive disability in 06/8113 with application.   Possibly due to husband's SS she receives   Social Determinants of Radio broadcast assistant Strain:   . Difficulty of Paying Living Expenses:   Food Insecurity:   . Worried About Charity fundraiser in the Last Year:   . Arboriculturist in the Last Year:   Transportation Needs:   . Film/video editor (Medical):   Marland Kitchen Lack of Transportation (Non-Medical):   Physical Activity:   . Days of Exercise per Week:   . Minutes of Exercise per Session:   Stress:   . Feeling of Stress :   Social Connections:   . Frequency of Communication with Friends and Family:   . Frequency of Social Gatherings with Friends and Family:   . Attends Religious Services:   . Active Member of Clubs or Organizations:   . Attends Archivist Meetings:   Marland Kitchen Marital Status:   Intimate Partner Violence:   . Fear of Current or Ex-Partner:   . Emotionally Abused:   Marland Kitchen Physically Abused:   . Sexually Abused:     Outpatient  Medications Prior to Visit  Medication Sig Dispense Refill  . acetaminophen (TYLENOL) 500 MG tablet Take 1,000 mg by mouth every 6 (six) hours as needed for mild pain.    . furosemide (LASIX) 40 MG tablet Take 1 tablet (40 mg total) by mouth 2 (two) times daily. 30 tablet 0  . gabapentin (NEURONTIN) 400 MG capsule Take 1 capsule (400 mg total) by mouth 2 (two) times daily. 60 capsule 0  . isosorbide mononitrate (IMDUR) 30 MG 24 hr tablet Take 1 tablet (30 mg total) by mouth daily. 30 tablet 0  . metoprolol succinate (  TOPROL-XL) 50 MG 24 hr tablet Take 1 tablet (50 mg total) by mouth daily. Take with or immediately following a meal. 60 tablet 0  . rosuvastatin (CRESTOR) 20 MG tablet Take 1 tablet (20 mg total) by mouth daily. 30 tablet 0  . metFORMIN (GLUCOPHAGE) 500 MG tablet Take 1 tablet (500 mg total) by mouth 2 (two) times daily with a meal. (Patient taking differently: Take 500 mg by mouth daily with breakfast. ) 60 tablet 1   No facility-administered medications prior to visit.    No Known Allergies  ROS Review of Systems    Objective:    Physical Exam  Constitutional: She is oriented to person, place, and time. No distress.  HENT:  Head: Normocephalic.  Cardiovascular: Normal rate, regular rhythm and normal heart sounds.  Pulmonary/Chest: Effort normal. She has rales.  Right lower lobe  Musculoskeletal:        General: Edema present.     Cervical back: Normal range of motion.     Comments: Ambulating via wheelchair 1+ pitting left lower extremity  Neurological: She is alert and oriented to person, place, and time.  Skin: Skin is warm. There is erythema.  Left lower extremity continues to be slightly erythematous along with multiple dark crater type areas on her leg   Psychiatric: She has a normal mood and affect. Her behavior is normal. Judgment and thought content normal.    BP (!) 147/73 (BP Location: Left Arm, Patient Position: Sitting)   Pulse 84   Temp 98.4 F  (36.9 C) (Oral)   LMP  (LMP Unknown)   SpO2 98%  Wt Readings from Last 3 Encounters:  04/30/19 235 lb (106.6 kg)  04/19/19 235 lb 0.2 oz (106.6 kg)  11/13/18 228 lb 2.8 oz (103.5 kg)     Health Maintenance Due  Topic Date Due  . FOOT EXAM  Never done  . OPHTHALMOLOGY EXAM  Never done  . URINE MICROALBUMIN  Never done  . COVID-19 Vaccine (1) Never done  . TETANUS/TDAP  Never done  . PAP SMEAR-Modifier  03/02/2015  . MAMMOGRAM  Never done    There are no preventive care reminders to display for this patient.  Lab Results  Component Value Date   TSH 3.037 04/11/2019   Lab Results  Component Value Date   WBC 4.6 05/16/2019   HGB 12.2 05/16/2019   HCT 38.1 05/16/2019   MCV 83 05/16/2019   PLT 16 (LL) 05/16/2019   Lab Results  Component Value Date   NA 139 05/16/2019   K 5.4 (H) 05/16/2019   CHLORIDE 108 09/30/2015   CO2 23 04/19/2019   GLUCOSE 141 (H) 05/16/2019   BUN 23 05/16/2019   CREATININE 1.21 (H) 05/16/2019   BILITOT 0.3 05/16/2019   ALKPHOS 102 05/16/2019   AST 32 05/16/2019   ALT 8 04/11/2019   PROT 8.2 05/16/2019   ALBUMIN 4.2 05/16/2019   CALCIUM 9.1 05/16/2019   ANIONGAP 11 04/19/2019   EGFR 54 (L) 09/30/2015   Lab Results  Component Value Date   CHOL 160 11/02/2018   Lab Results  Component Value Date   HDL 62 11/02/2018   Lab Results  Component Value Date   LDLCALC 82 11/02/2018   Lab Results  Component Value Date   TRIG 89 11/02/2018   Lab Results  Component Value Date   CHOLHDL 3.1 08/26/2015   Lab Results  Component Value Date   HGBA1C 5.5 04/12/2019      Assessment & Plan:  Problem List Items Addressed This Visit    None    Visit Diagnoses    Rales 1/2 way up posterior chest wall on right side    -  Primary   Relevant Orders   Comp. Metabolic Panel (12) (Completed)   CBC with Differential/Platelet (Completed)   DG Chest 2 View (Completed)   Encounter for medical screening examination       Relevant Orders    Comp. Metabolic Panel (12) (Completed)   CBC with Differential/Platelet (Completed)   DG Chest 2 View (Completed)      No orders of the defined types were placed in this encounter.   Follow-up: No follow-ups on file.    Vevelyn Francois, NP

## 2019-05-17 ENCOUNTER — Other Ambulatory Visit: Payer: Self-pay | Admitting: Nurse Practitioner

## 2019-05-17 LAB — CBC WITH DIFFERENTIAL/PLATELET
Basophils Absolute: 0 10*3/uL (ref 0.0–0.2)
Basos: 0 %
EOS (ABSOLUTE): 0.1 10*3/uL (ref 0.0–0.4)
Eos: 2 %
Hematocrit: 38.1 % (ref 34.0–46.6)
Hemoglobin: 12.2 g/dL (ref 11.1–15.9)
Immature Grans (Abs): 0 10*3/uL (ref 0.0–0.1)
Immature Granulocytes: 0 %
Lymphocytes Absolute: 1.2 10*3/uL (ref 0.7–3.1)
Lymphs: 27 %
MCH: 26.4 pg — ABNORMAL LOW (ref 26.6–33.0)
MCHC: 32 g/dL (ref 31.5–35.7)
MCV: 83 fL (ref 79–97)
Monocytes Absolute: 0.3 10*3/uL (ref 0.1–0.9)
Monocytes: 7 %
Neutrophils Absolute: 2.9 10*3/uL (ref 1.4–7.0)
Neutrophils: 64 %
Platelets: 16 10*3/uL — CL (ref 150–450)
RBC: 4.62 x10E6/uL (ref 3.77–5.28)
RDW: 16 % — ABNORMAL HIGH (ref 11.7–15.4)
WBC: 4.6 10*3/uL (ref 3.4–10.8)

## 2019-05-17 LAB — COMP. METABOLIC PANEL (12)
AST: 32 IU/L (ref 0–40)
Albumin/Globulin Ratio: 1.1 — ABNORMAL LOW (ref 1.2–2.2)
Albumin: 4.2 g/dL (ref 3.8–4.9)
Alkaline Phosphatase: 102 IU/L (ref 39–117)
BUN/Creatinine Ratio: 19 (ref 9–23)
BUN: 23 mg/dL (ref 6–24)
Bilirubin Total: 0.3 mg/dL (ref 0.0–1.2)
Calcium: 9.1 mg/dL (ref 8.7–10.2)
Chloride: 104 mmol/L (ref 96–106)
Creatinine, Ser: 1.21 mg/dL — ABNORMAL HIGH (ref 0.57–1.00)
GFR calc Af Amer: 59 mL/min/{1.73_m2} — ABNORMAL LOW (ref >59)
GFR calc non Af Amer: 52 mL/min/{1.73_m2} — ABNORMAL LOW (ref >59)
Globulin, Total: 4 g/dL (ref 1.5–4.5)
Glucose: 141 mg/dL — ABNORMAL HIGH (ref 65–99)
Potassium: 5.4 mmol/L — ABNORMAL HIGH (ref 3.5–5.2)
Sodium: 139 mmol/L (ref 134–144)
Total Protein: 8.2 g/dL (ref 6.0–8.5)

## 2019-05-20 NOTE — Progress Notes (Signed)
Patient Care Team: Nicholas Lose, MD as PCP - General (Hematology and Oncology)  DIAGNOSIS:    ICD-10-CM   1. Acute ITP (HCC)  D69.3 PHYSICIAN COMMUNICATION ORDER    Hepatitis B surface antigen    Hepatitis B core antibody, total    CHIEF COMPLIANT: Follow-up of relapsed ITP on Promacta  INTERVAL HISTORY: [name] is a 54 y.o. with above-mentioned history of severe thrombocytopeniacurrently on Promacta. She was admitted from 04/10/19-04/19/19 for bilateral lower extremity pain and swelling due to acute exacerbation of CHF. Labs on 05/16/19 showed platelets 16. She presents to the clinic todayfor follow-up.   ALLERGIES:  has No Known Allergies.  MEDICATIONS:  Current Outpatient Medications  Medication Sig Dispense Refill  . acetaminophen (TYLENOL) 500 MG tablet Take 1,000 mg by mouth every 6 (six) hours as needed for mild pain.    . furosemide (LASIX) 40 MG tablet Take 1 tablet (40 mg total) by mouth 2 (two) times daily. 30 tablet 0  . gabapentin (NEURONTIN) 400 MG capsule Take 1 capsule (400 mg total) by mouth 2 (two) times daily. 60 capsule 0  . isosorbide mononitrate (IMDUR) 30 MG 24 hr tablet Take 1 tablet (30 mg total) by mouth daily. 30 tablet 0  . metFORMIN (GLUCOPHAGE) 500 MG tablet Take 1 tablet (500 mg total) by mouth 2 (two) times daily with a meal. (Patient taking differently: Take 500 mg by mouth daily with breakfast. ) 60 tablet 1  . metoprolol succinate (TOPROL-XL) 50 MG 24 hr tablet Take 1 tablet (50 mg total) by mouth daily. Take with or immediately following a meal. 60 tablet 0  . rosuvastatin (CRESTOR) 20 MG tablet Take 1 tablet (20 mg total) by mouth daily. 30 tablet 0   No current facility-administered medications for this visit.    PHYSICAL EXAMINATION: ECOG PERFORMANCE STATUS: 2 - Symptomatic, <50% confined to bed  Vitals:   05/21/19 1554  BP: (!) 150/79  Pulse: 79  Resp: 18  Temp: 98.7 F (37.1 C)  SpO2: 98%   Filed Weights   05/21/19 1554   Weight: 235 lb 1.6 oz (106.6 kg)    LABORATORY DATA:  I have reviewed the data as listed CMP Latest Ref Rng & Units 05/16/2019 04/30/2019 04/19/2019  Glucose 65 - 99 mg/dL 141(H) 101(H) 135(H)  BUN 6 - 24 mg/dL 23 27(H) 52(H)  Creatinine 0.57 - 1.00 mg/dL 1.21(H) 1.62(H) 1.52(H)  Sodium 134 - 144 mmol/L 139 140 134(L)  Potassium 3.5 - 5.2 mmol/L 5.4(H) 5.3(H) 4.3  Chloride 96 - 106 mmol/L 104 104 100  CO2 22 - 32 mmol/L - - 23  Calcium 8.7 - 10.2 mg/dL 9.1 8.8 8.6(L)  Total Protein 6.0 - 8.5 g/dL 8.2 8.1 -  Total Bilirubin 0.0 - 1.2 mg/dL 0.3 0.4 -  Alkaline Phos 39 - 117 IU/L 102 82 -  AST 0 - 40 IU/L 32 29 -  ALT 0 - 44 U/L - - -    Lab Results  Component Value Date   WBC 4.6 05/16/2019   HGB 12.2 05/16/2019   HCT 38.1 05/16/2019   MCV 83 05/16/2019   PLT 16 (LL) 05/16/2019   NEUTROABS 2.9 05/16/2019    ASSESSMENT & PLAN:  Acute ITP (HCC) Relapsing ITP: Patient response to steroids and IVIG but because she is a diabetic we cannot use steroids.  IVIG response appeared to only last for a couple of months. Prior IVIG: March 2021, April 2021 Because of her history of heart failure  I am also worried about the volume of fluid that she gets with IVIG. Lab review: Platelet count 16 on 05/16/2019  Recommendation: Rituxan weekly x4 and then reassessment.  She might need 8 weeks of Rituxan. In order to receive Rituxan I will check for hepatitis B and C panel today. I counseled her extensively about the risks and benefits of Rituxan.  Because Rituxan is an anti-CD20 monoclonal antibody that could be long-term immunosuppression as well.  We will see her weekly with labs. Eye surgery will be delayed until the platelets are over 100    Orders Placed This Encounter  Procedures  . Hepatitis B surface antigen    Standing Status:   Standing    Number of Occurrences:   1    Standing Expiration Date:   05/20/2020  . Hepatitis B core antibody, total    Standing Status:   Standing     Number of Occurrences:   1    Standing Expiration Date:   05/20/2020  . PHYSICIAN COMMUNICATION ORDER    Hepatitis B Virus screening with HBsAg and anti-HBc recommended prior to treatment with rituximab, ofatumumab, or obinutuzumab.   The patient has a good understanding of the overall plan. she agrees with it. she will call with any problems that may develop before the next visit here.  Total time spent: 30 mins including face to face time and time spent for planning, charting and coordination of care  Serena Croissant, MD 05/21/2019  I, Kirt Boys Dorshimer, am acting as scribe for Dr. Serena Croissant.  I have reviewed the above documentation for accuracy and completeness, and I agree with the above.

## 2019-05-21 ENCOUNTER — Encounter: Payer: Self-pay | Admitting: *Deleted

## 2019-05-21 ENCOUNTER — Inpatient Hospital Stay: Payer: Medicare Other | Attending: Hematology and Oncology | Admitting: Hematology and Oncology

## 2019-05-21 ENCOUNTER — Other Ambulatory Visit: Payer: Self-pay

## 2019-05-21 VITALS — BP 150/79 | HR 79 | Temp 98.7°F | Resp 18 | Ht 66.0 in | Wt 235.1 lb

## 2019-05-21 DIAGNOSIS — Z7984 Long term (current) use of oral hypoglycemic drugs: Secondary | ICD-10-CM | POA: Diagnosis not present

## 2019-05-21 DIAGNOSIS — D693 Immune thrombocytopenic purpura: Secondary | ICD-10-CM | POA: Insufficient documentation

## 2019-05-21 DIAGNOSIS — Z79899 Other long term (current) drug therapy: Secondary | ICD-10-CM | POA: Insufficient documentation

## 2019-05-21 DIAGNOSIS — I509 Heart failure, unspecified: Secondary | ICD-10-CM | POA: Diagnosis not present

## 2019-05-21 DIAGNOSIS — E119 Type 2 diabetes mellitus without complications: Secondary | ICD-10-CM | POA: Insufficient documentation

## 2019-05-21 DIAGNOSIS — Z5112 Encounter for antineoplastic immunotherapy: Secondary | ICD-10-CM | POA: Diagnosis not present

## 2019-05-21 NOTE — Assessment & Plan Note (Signed)
Relapsing ITP: Patient response to steroids and IVIG but because she is a diabetic we cannot use steroids.  IVIG response appeared to only last for a couple of months. Prior IVIG: March 2021, April 2021 Because of her history of heart failure I am also worried about the volume of fluid that she gets with IVIG. Lab review: Platelet count 16 on 05/16/2019  Recommendation: Rituxan weekly x4 and then reassessment.  She might need 8 weeks of Rituxan. In order to receive Rituxan I will check for hepatitis B and C panel today. I counseled her extensively about the risks and benefits of Rituxan.  Because Rituxan is an anti-CD20 monoclonal antibody that could be long-term immunosuppression as well.  We will see her weekly with labs.

## 2019-05-21 NOTE — Progress Notes (Signed)
Received fax from [facility] stating pt is needing cataract surgery and will need medical clearance for the surgery.  Per MD pt is able to undergo surgery once platelets are greater than 100.  RN faxed request back to [facility] with that information included.

## 2019-05-22 ENCOUNTER — Telehealth: Payer: Self-pay | Admitting: Hematology and Oncology

## 2019-05-22 NOTE — Progress Notes (Signed)
Per Dr. Pamelia Hoit, okay to treat with pending Hep B/C labs. They just need to be drawn prior to initiating treatment.   Carylon Perches, PharmD, BCPS, BCOP Oncology Pharmacist Pharmacy Phone: [phone] 05/22/2019

## 2019-05-22 NOTE — Telephone Encounter (Signed)
Scheduled per 05/17 los, patient has been called and voicemail was left. 

## 2019-05-23 ENCOUNTER — Ambulatory Visit: Payer: Medicare Other | Admitting: Internal Medicine

## 2019-05-23 ENCOUNTER — Inpatient Hospital Stay: Payer: Medicare Other

## 2019-05-23 ENCOUNTER — Other Ambulatory Visit: Payer: Self-pay

## 2019-05-23 ENCOUNTER — Other Ambulatory Visit: Payer: Self-pay | Admitting: Hematology and Oncology

## 2019-05-23 VITALS — BP 166/75 | HR 87 | Temp 99.0°F | Resp 18

## 2019-05-23 DIAGNOSIS — D696 Thrombocytopenia, unspecified: Secondary | ICD-10-CM

## 2019-05-23 DIAGNOSIS — D693 Immune thrombocytopenic purpura: Secondary | ICD-10-CM

## 2019-05-23 DIAGNOSIS — Z5112 Encounter for antineoplastic immunotherapy: Secondary | ICD-10-CM | POA: Diagnosis not present

## 2019-05-23 LAB — CMP (CANCER CENTER ONLY)
ALT: 31 U/L (ref 0–44)
AST: 42 U/L — ABNORMAL HIGH (ref 15–41)
Albumin: 3.7 g/dL (ref 3.5–5.0)
Alkaline Phosphatase: 101 U/L (ref 38–126)
Anion gap: 11 (ref 5–15)
BUN: 24 mg/dL — ABNORMAL HIGH (ref 6–20)
CO2: 20 mmol/L — ABNORMAL LOW (ref 22–32)
Calcium: 9.2 mg/dL (ref 8.9–10.3)
Chloride: 106 mmol/L (ref 98–111)
Creatinine: 1.21 mg/dL — ABNORMAL HIGH (ref 0.44–1.00)
GFR, Est AFR Am: 60 mL/min — ABNORMAL LOW (ref >60)
GFR, Estimated: 51 mL/min — ABNORMAL LOW (ref >60)
Glucose, Bld: 179 mg/dL — ABNORMAL HIGH (ref 70–99)
Potassium: 5 mmol/L (ref 3.5–5.1)
Sodium: 137 mmol/L (ref 135–145)
Total Bilirubin: 0.4 mg/dL (ref 0.3–1.2)
Total Protein: 8.3 g/dL — ABNORMAL HIGH (ref 6.5–8.1)

## 2019-05-23 LAB — HEPATITIS B CORE ANTIBODY, TOTAL: Hep B Core Total Ab: REACTIVE — AB

## 2019-05-23 LAB — CBC WITH DIFFERENTIAL (CANCER CENTER ONLY)
Abs Immature Granulocytes: 0.01 10*3/uL (ref 0.00–0.07)
Basophils Absolute: 0 10*3/uL (ref 0.0–0.1)
Basophils Relative: 1 %
Eosinophils Absolute: 0.1 10*3/uL (ref 0.0–0.5)
Eosinophils Relative: 2 %
HCT: 39.1 % (ref 36.0–46.0)
Hemoglobin: 12 g/dL (ref 12.0–15.0)
Immature Granulocytes: 0 %
Lymphocytes Relative: 24 %
Lymphs Abs: 1.1 10*3/uL (ref 0.7–4.0)
MCH: 26.6 pg (ref 26.0–34.0)
MCHC: 30.7 g/dL (ref 30.0–36.0)
MCV: 86.7 fL (ref 80.0–100.0)
Monocytes Absolute: 0.3 10*3/uL (ref 0.1–1.0)
Monocytes Relative: 8 %
Neutro Abs: 3 10*3/uL (ref 1.7–7.7)
Neutrophils Relative %: 65 %
Platelet Count: 10 10*3/uL — ABNORMAL LOW (ref 150–400)
RBC: 4.51 MIL/uL (ref 3.87–5.11)
RDW: 18.4 % — ABNORMAL HIGH (ref 11.5–15.5)
WBC Count: 4.6 10*3/uL (ref 4.0–10.5)
nRBC: 0 % (ref 0.0–0.2)

## 2019-05-23 LAB — HEPATITIS B SURFACE ANTIGEN: Hepatitis B Surface Ag: NONREACTIVE

## 2019-05-23 MED ORDER — ACETAMINOPHEN 325 MG PO TABS
650.0000 mg | ORAL_TABLET | Freq: Once | ORAL | Status: AC
  Administered 2019-05-23: 650 mg via ORAL

## 2019-05-23 MED ORDER — DIPHENHYDRAMINE HCL 25 MG PO CAPS
ORAL_CAPSULE | ORAL | Status: AC
  Filled 2019-05-23: qty 2

## 2019-05-23 MED ORDER — ACETAMINOPHEN 325 MG PO TABS
ORAL_TABLET | ORAL | Status: AC
  Filled 2019-05-23: qty 2

## 2019-05-23 MED ORDER — DIPHENHYDRAMINE HCL 25 MG PO CAPS
50.0000 mg | ORAL_CAPSULE | Freq: Once | ORAL | Status: AC
  Administered 2019-05-23: 50 mg via ORAL

## 2019-05-23 MED ORDER — SODIUM CHLORIDE 0.9 % IV SOLN
375.0000 mg/m2 | Freq: Once | INTRAVENOUS | Status: AC
  Administered 2019-05-23: 800 mg via INTRAVENOUS
  Filled 2019-05-23: qty 30

## 2019-05-23 MED ORDER — SODIUM CHLORIDE 0.9 % IV SOLN
Freq: Once | INTRAVENOUS | Status: AC
  Filled 2019-05-23: qty 250

## 2019-05-23 NOTE — Patient Instructions (Signed)
[facility] Discharge Instructions for Patients Receiving Chemotherapy  Today you received the following chemotherapy agents: Rituximab  To help prevent nausea and vomiting after your treatment, we encourage you to take your nausea medication as directed.   If you develop nausea and vomiting that is not controlled by your nausea medication, call the clinic.   BELOW ARE SYMPTOMS THAT SHOULD BE REPORTED IMMEDIATELY:  *FEVER GREATER THAN 100.5 F  *CHILLS WITH OR WITHOUT FEVER  NAUSEA AND VOMITING THAT IS NOT CONTROLLED WITH YOUR NAUSEA MEDICATION  *UNUSUAL SHORTNESS OF BREATH  *UNUSUAL BRUISING OR BLEEDING  TENDERNESS IN MOUTH AND THROAT WITH OR WITHOUT PRESENCE OF ULCERS  *URINARY PROBLEMS  *BOWEL PROBLEMS  UNUSUAL RASH Items with * indicate a potential emergency and should be followed up as soon as possible.  Feel free to call the clinic should you have any questions or concerns. The clinic phone number is [phone].  Please show the CHEMO ALERT CARD at check-in to the Emergency Department and triage nurse.  Rituximab injection What is this medicine? RITUXIMAB (ri TUX i mab) is a monoclonal antibody. It is used to treat certain types of cancer like non-Hodgkin lymphoma and chronic lymphocytic leukemia. It is also used to treat rheumatoid arthritis, granulomatosis with polyangiitis (or Wegener's granulomatosis), microscopic polyangiitis, and pemphigus vulgaris. This medicine may be used for other purposes; ask your health care provider or pharmacist if you have questions. COMMON BRAND NAME(S): Rituxan, RUXIENCE What should I tell my health care provider before I take this medicine? They need to know if you have any of these conditions:  heart disease  infection (especially a virus infection such as hepatitis B, chickenpox, cold sores, or herpes)  immune system problems  irregular heartbeat  kidney disease  low blood counts, like low white cell,  platelet, or red cell counts  lung or breathing disease, like asthma  recently received or scheduled to receive a vaccine  an unusual or allergic reaction to rituximab, other medicines, foods, dyes, or preservatives  pregnant or trying to get pregnant  breast-feeding How should I use this medicine? This medicine is for infusion into a vein. It is administered in a hospital or clinic by a specially trained health care professional. A special MedGuide will be given to you by the pharmacist with each prescription and refill. Be sure to read this information carefully each time. Talk to your pediatrician regarding the use of this medicine in children. This medicine is not approved for use in children. Overdosage: If you think you have taken too much of this medicine contact a poison control center or emergency room at once. NOTE: This medicine is only for you. Do not share this medicine with others. What if I miss a dose? It is important not to miss a dose. Call your doctor or health care professional if you are unable to keep an appointment. What may interact with this medicine?  cisplatin  live virus vaccines This list may not describe all possible interactions. Give your health care provider a list of all the medicines, herbs, non-prescription drugs, or dietary supplements you use. Also tell them if you smoke, drink alcohol, or use illegal drugs. Some items may interact with your medicine. What should I watch for while using this medicine? Your condition will be monitored carefully while you are receiving this medicine. You may need blood work done while you are taking this medicine. This medicine can cause serious allergic reactions. To reduce your risk you may   need to take medicine before treatment with this medicine. Take your medicine as directed. In some patients, this medicine may cause a serious brain infection that may cause death. If you have any problems seeing, thinking,  speaking, walking, or standing, tell your healthcare professional right away. If you cannot reach your healthcare professional, urgently seek other source of medical care. Call your doctor or health care professional for advice if you get a fever, chills or sore throat, or other symptoms of a cold or flu. Do not treat yourself. This drug decreases your body's ability to fight infections. Try to avoid being around people who are sick. Do not become pregnant while taking this medicine or for at least 12 months after stopping it. Women should inform their doctor if they wish to become pregnant or think they might be pregnant. There is a potential for serious side effects to an unborn child. Talk to your health care professional or pharmacist for more information. Do not breast-feed an infant while taking this medicine or for at least 6 months after stopping it. What side effects may I notice from receiving this medicine? Side effects that you should report to your doctor or health care professional as soon as possible:  allergic reactions like skin rash, itching or hives; swelling of the face, lips, or tongue  breathing problems  chest pain  changes in vision  diarrhea  headache with fever, neck stiffness, sensitivity to light, nausea, or confusion  fast, irregular heartbeat  loss of memory  low blood counts - this medicine may decrease the number of white blood cells, red blood cells and platelets. You may be at increased risk for infections and bleeding.  mouth sores  problems with balance, talking, or walking  redness, blistering, peeling or loosening of the skin, including inside the mouth  signs of infection - fever or chills, cough, sore throat, pain or difficulty passing urine  signs and symptoms of kidney injury like trouble passing urine or change in the amount of urine  signs and symptoms of liver injury like dark yellow or brown urine; general ill feeling or flu-like  symptoms; light-colored stools; loss of appetite; nausea; right upper belly pain; unusually weak or tired; yellowing of the eyes or skin  signs and symptoms of low blood pressure like dizziness; feeling faint or lightheaded, falls; unusually weak or tired  stomach pain  swelling of the ankles, feet, hands  unusual bleeding or bruising  vomiting Side effects that usually do not require medical attention (report to your doctor or health care professional if they continue or are bothersome):  headache  joint pain  muscle cramps or muscle pain  nausea  tiredness This list may not describe all possible side effects. Call your doctor for medical advice about side effects. You may report side effects to FDA at 1-800-FDA-1088. Where should I keep my medicine? This drug is given in a hospital or clinic and will not be stored at home. NOTE: This sheet is a summary. It may not cover all possible information. If you have questions about this medicine, talk to your doctor, pharmacist, or health care provider.  2020 Elsevier/Gold Standard (2018-02-01 22:01:36)  

## 2019-05-25 ENCOUNTER — Telehealth: Payer: Self-pay | Admitting: Hematology and Oncology

## 2019-05-25 ENCOUNTER — Telehealth: Payer: Self-pay | Admitting: Nurse Practitioner

## 2019-05-25 ENCOUNTER — Other Ambulatory Visit: Payer: Self-pay | Admitting: *Deleted

## 2019-05-25 DIAGNOSIS — D693 Immune thrombocytopenic purpura: Secondary | ICD-10-CM

## 2019-05-25 DIAGNOSIS — D696 Thrombocytopenia, unspecified: Secondary | ICD-10-CM

## 2019-05-25 NOTE — Telephone Encounter (Signed)
Called and spoke with patient she is going have the home health to call me back to give the information for glucometer,and if this some that they can supply for or where to fax the order to for patient to get meter. Told patient to ask for me when she call back.

## 2019-05-25 NOTE — Telephone Encounter (Signed)
error 

## 2019-05-25 NOTE — Telephone Encounter (Signed)
Scheduled appt per 5/21 sch message - pt is aware of time change.

## 2019-05-25 NOTE — Telephone Encounter (Signed)
Called and [facility] for patient call us back.  

## 2019-05-25 NOTE — Telephone Encounter (Signed)
Home health called regarding talking glucometer.

## 2019-05-28 ENCOUNTER — Inpatient Hospital Stay: Payer: Medicare Other | Admitting: Hematology and Oncology

## 2019-05-28 ENCOUNTER — Inpatient Hospital Stay: Payer: Medicare Other

## 2019-05-29 NOTE — Progress Notes (Signed)
Rapid Infusion Rituximab Pharmacist Evaluation  [name] is a 54 y.o. female being treated with rituximab for ITP. This patient may be considered for RIR.   A pharmacist has verified the patient tolerated rituximab infusions per the [facility] standard infusion protocol without grade 3-4 infusion reactions. The treatment plan will be updated to reflect RIR if the patient qualifies per the checklist below:   Age > 52 years old Yes   Clinically significant cardiovascular disease No   Circulating lymphocyte count < 5000/uL prior to cycle two Yes  Lab Results  Component Value Date   LYMPHSABS 1.1 05/23/2019    Prior documented grade 3-4 infusion reaction to rituximab No   Prior documented grade 1-2 infusion reaction to rituximab (If YES, Pharmacist will confirm with Physician if patient is still a candidate for RIR) No   Previous rituximab infusion within the past 6 months Yes   Treatment Plan updated orders to reflect RIR Yes    [name] does meet the criteria for Rapid Infusion Rituximab. This patient is going to be switched to rapid infusion rituximab.   Carylon Perches, PharmD, BCPS, BCOP Oncology Pharmacist Pharmacy Phone: [phone] 05/29/2019

## 2019-05-30 ENCOUNTER — Telehealth: Payer: Self-pay

## 2019-05-30 ENCOUNTER — Inpatient Hospital Stay: Payer: Medicare Other

## 2019-05-30 ENCOUNTER — Inpatient Hospital Stay: Payer: Medicare Other | Admitting: Hematology and Oncology

## 2019-05-30 NOTE — Telephone Encounter (Signed)
Pt called to cancel chemo appts. Will consult w/Dr Pamelia Hoit and infusion about r/s tx.

## 2019-05-30 NOTE — Assessment & Plan Note (Deleted)
Relapsing ITP: Patient response to steroids and IVIG but because she is a diabetic we cannot use steroids.  IVIG response appeared to only last for a couple of months. Prior IVIG: March 2021, April 2021 Because of her history of heart failure I am also worried about the volume of fluid that she gets with IVIG. Lab review: Platelet count 10 on 05/23/2019  Current treatment: Rituxan weekly x4 started 05/23/2019, today's week 2 Rituxan toxicities: None Lab review:

## 2019-05-30 NOTE — Progress Notes (Signed)
Pharmacist Chemotherapy Monitoring - Follow Up Assessment    I verify that I have reviewed each item in the below checklist:  . Regimen for the patient is scheduled for the appropriate day and plan matches scheduled date. Marland Kitchen Appropriate non-routine labs are ordered dependent on drug ordered. . If applicable, additional medications reviewed and ordered per protocol based on lifetime cumulative doses and/or treatment regimen.   Plan for follow-up and/or issues identified: No . I-vent associated with next due treatment: No . MD and/or nursing notified: No  [name] D 05/30/2019 3:57 PM

## 2019-06-01 ENCOUNTER — Telehealth: Payer: Self-pay | Admitting: Nurse Practitioner

## 2019-06-01 NOTE — Telephone Encounter (Signed)
Sarah from [facility] called to follow up about the glucometer. Her number is [phone].

## 2019-06-05 NOTE — Progress Notes (Signed)
Patient Care Team: Nicholas Lose, MD as PCP - General (Hematology and Oncology)  DIAGNOSIS:    ICD-10-CM   1. Acute ITP (HCC)  D69.3     CHIEF COMPLIANT: Follow-up of relapsed ITP on Rituxan  INTERVAL HISTORY: [name] is a 54 y.o. with above-mentioned history of severe thrombocytopeniacurrently on Rituxan. Labs on 05/23/19 showed platelets 10.   Today her platelet count is 9.  She presents to the clinic todayfor follow-up.   She missed last week's Rituxan treatment because of transportation issues.  For this week we arrange for transportation but she decided to come with her own self transportation.  She is completely blind and requires eye surgery which unfortunately cannot happen with such low platelet counts.  ALLERGIES:  has No Known Allergies.   MEDICATIONS:  Current Outpatient Medications  Medication Sig Dispense Refill  . acetaminophen (TYLENOL) 500 MG tablet Take 1,000 mg by mouth every 6 (six) hours as needed for mild pain.    . furosemide (LASIX) 40 MG tablet Take 1 tablet (40 mg total) by mouth 2 (two) times daily. 30 tablet 0  . gabapentin (NEURONTIN) 400 MG capsule Take 1 capsule (400 mg total) by mouth 2 (two) times daily. 60 capsule 0  . isosorbide mononitrate (IMDUR) 30 MG 24 hr tablet Take 1 tablet (30 mg total) by mouth daily. 30 tablet 0  . metFORMIN (GLUCOPHAGE) 500 MG tablet Take 1 tablet (500 mg total) by mouth 2 (two) times daily with a meal. (Patient taking differently: Take 500 mg by mouth daily with breakfast. ) 60 tablet 1  . metoprolol succinate (TOPROL-XL) 50 MG 24 hr tablet Take 1 tablet (50 mg total) by mouth daily. Take with or immediately following a meal. 60 tablet 0  . rosuvastatin (CRESTOR) 20 MG tablet Take 1 tablet (20 mg total) by mouth daily. 30 tablet 0   No current facility-administered medications for this visit.    PHYSICAL EXAMINATION: ECOG PERFORMANCE STATUS: 1 - Symptomatic but completely ambulatory  Vitals:   06/06/19 0816   BP: (!) 149/77  Pulse: 84  Resp: 18  Temp: 99.1 F (37.3 C)  SpO2: 95%   Filed Weights   06/06/19 0816  Weight: 213 lb 1.6 oz (96.7 kg)    LABORATORY DATA:  I have reviewed the data as listed CMP Latest Ref Rng & Units 05/23/2019 05/16/2019 04/30/2019  Glucose 70 - 99 mg/dL 179(H) 141(H) 101(H)  BUN 6 - 20 mg/dL 24(H) 23 27(H)  Creatinine 0.44 - 1.00 mg/dL 1.21(H) 1.21(H) 1.62(H)  Sodium 135 - 145 mmol/L 137 139 140  Potassium 3.5 - 5.1 mmol/L 5.0 5.4(H) 5.3(H)  Chloride 98 - 111 mmol/L 106 104 104  CO2 22 - 32 mmol/L 20(L) - -  Calcium 8.9 - 10.3 mg/dL 9.2 9.1 8.8  Total Protein 6.5 - 8.1 g/dL 8.3(H) 8.2 8.1  Total Bilirubin 0.3 - 1.2 mg/dL 0.4 0.3 0.4  Alkaline Phos 38 - 126 U/L 101 102 82  AST 15 - 41 U/L 42(H) 32 29  ALT 0 - 44 U/L 31 - -    Lab Results  Component Value Date   WBC 5.0 06/06/2019   HGB 11.3 (L) 06/06/2019   HCT 36.3 06/06/2019   MCV 85.8 06/06/2019   PLT 9 (LL) 06/06/2019   NEUTROABS 3.0 06/06/2019    ASSESSMENT & PLAN:  Acute ITP (HCC) Relapsing ITP: Patient response to steroids and IVIG but because she is a diabetic we cannot use steroids.  IVIG response  appeared to only last for a couple of months. Prior IVIG: March 2021, April 2021 Because of her history of heart failure I am also worried about the volume of fluid that she gets with IVIG. Lab review: Platelet count 16 on 05/16/2019 ----------------------------------------------------------------------------------------------------------------------------------------------------------------- Current treatment: Rituxan weekly x4 started 05/23/2019 (patient missed her treatment on 05/30/2019) Rituxan toxicities: Headaches and body aches after Rituxan. Because she needs eye surgery done relatively soon we decided that next week we will give her IVIG instead of Rituxan.  She will need a second dose of IVIG the day after.  This has worked very well in the past.  I suspect that she could potentially  have surgery on 06/21/2019 if IVIG treatment works. I will reach out to her ophthalmologist and discussed the plan.  His number is [phone]. Today she will receive Rituxan treatment.  Return to clinic in 1 week for IVIG treatment (instead of Rituxan).  No orders of the defined types were placed in this encounter.  The patient has a good understanding of the overall plan. she agrees with it. she will call with any problems that may develop before the next visit here.  Total time spent: 30 mins including face to face time and time spent for planning, charting and coordination of care  Serena Croissant, MD 06/06/2019  I, Kirt Boys Dorshimer, am acting as scribe for Dr. Serena Croissant.  I have reviewed the above documentation for accuracy and completeness, and I agree with the above.

## 2019-06-05 NOTE — Telephone Encounter (Signed)
Called spoke with Maralyn Sago the home health nurse. Pt is needing a rx written  for Glucometer that talks, because pt is not able to see the number on her meter.

## 2019-06-06 ENCOUNTER — Inpatient Hospital Stay: Payer: Medicare Other

## 2019-06-06 ENCOUNTER — Inpatient Hospital Stay: Payer: Medicare Other | Attending: Hematology and Oncology

## 2019-06-06 ENCOUNTER — Other Ambulatory Visit: Payer: Self-pay | Admitting: Nurse Practitioner

## 2019-06-06 ENCOUNTER — Inpatient Hospital Stay (HOSPITAL_BASED_OUTPATIENT_CLINIC_OR_DEPARTMENT_OTHER): Payer: Medicare Other | Admitting: Hematology and Oncology

## 2019-06-06 ENCOUNTER — Other Ambulatory Visit: Payer: Self-pay

## 2019-06-06 VITALS — BP 144/67 | HR 79 | Temp 98.2°F | Resp 18

## 2019-06-06 DIAGNOSIS — D693 Immune thrombocytopenic purpura: Secondary | ICD-10-CM

## 2019-06-06 DIAGNOSIS — R519 Headache, unspecified: Secondary | ICD-10-CM | POA: Diagnosis not present

## 2019-06-06 DIAGNOSIS — Z79899 Other long term (current) drug therapy: Secondary | ICD-10-CM | POA: Insufficient documentation

## 2019-06-06 DIAGNOSIS — H543 Unqualified visual loss, both eyes: Secondary | ICD-10-CM | POA: Diagnosis not present

## 2019-06-06 DIAGNOSIS — D696 Thrombocytopenia, unspecified: Secondary | ICD-10-CM

## 2019-06-06 DIAGNOSIS — E119 Type 2 diabetes mellitus without complications: Secondary | ICD-10-CM | POA: Diagnosis not present

## 2019-06-06 DIAGNOSIS — Z5112 Encounter for antineoplastic immunotherapy: Secondary | ICD-10-CM | POA: Diagnosis present

## 2019-06-06 LAB — CBC WITH DIFFERENTIAL (CANCER CENTER ONLY)
Abs Immature Granulocytes: 0.01 10*3/uL (ref 0.00–0.07)
Basophils Absolute: 0 10*3/uL (ref 0.0–0.1)
Basophils Relative: 1 %
Eosinophils Absolute: 0.2 10*3/uL (ref 0.0–0.5)
Eosinophils Relative: 3 %
HCT: 36.3 % (ref 36.0–46.0)
Hemoglobin: 11.3 g/dL — ABNORMAL LOW (ref 12.0–15.0)
Immature Granulocytes: 0 %
Lymphocytes Relative: 27 %
Lymphs Abs: 1.4 10*3/uL (ref 0.7–4.0)
MCH: 26.7 pg (ref 26.0–34.0)
MCHC: 31.1 g/dL (ref 30.0–36.0)
MCV: 85.8 fL (ref 80.0–100.0)
Monocytes Absolute: 0.4 10*3/uL (ref 0.1–1.0)
Monocytes Relative: 8 %
Neutro Abs: 3 10*3/uL (ref 1.7–7.7)
Neutrophils Relative %: 61 %
Platelet Count: 9 10*3/uL — CL (ref 150–400)
RBC: 4.23 MIL/uL (ref 3.87–5.11)
RDW: 18.6 % — ABNORMAL HIGH (ref 11.5–15.5)
WBC Count: 5 10*3/uL (ref 4.0–10.5)
nRBC: 0 % (ref 0.0–0.2)

## 2019-06-06 LAB — CMP (CANCER CENTER ONLY)
ALT: 31 U/L (ref 0–44)
AST: 41 U/L (ref 15–41)
Albumin: 3.6 g/dL (ref 3.5–5.0)
Alkaline Phosphatase: 93 U/L (ref 38–126)
Anion gap: 12 (ref 5–15)
BUN: 26 mg/dL — ABNORMAL HIGH (ref 6–20)
CO2: 18 mmol/L — ABNORMAL LOW (ref 22–32)
Calcium: 8.8 mg/dL — ABNORMAL LOW (ref 8.9–10.3)
Chloride: 110 mmol/L (ref 98–111)
Creatinine: 1.45 mg/dL — ABNORMAL HIGH (ref 0.44–1.00)
GFR, Est AFR Am: 48 mL/min — ABNORMAL LOW (ref >60)
GFR, Estimated: 41 mL/min — ABNORMAL LOW (ref >60)
Glucose, Bld: 140 mg/dL — ABNORMAL HIGH (ref 70–99)
Potassium: 5.7 mmol/L — ABNORMAL HIGH (ref 3.5–5.1)
Sodium: 140 mmol/L (ref 135–145)
Total Bilirubin: 0.3 mg/dL (ref 0.3–1.2)
Total Protein: 7.7 g/dL (ref 6.5–8.1)

## 2019-06-06 MED ORDER — ACETAMINOPHEN 325 MG PO TABS
650.0000 mg | ORAL_TABLET | Freq: Once | ORAL | Status: AC
  Administered 2019-06-06: 650 mg via ORAL

## 2019-06-06 MED ORDER — PRODIGY AUTOCODE BLOOD GLUCOSE W/DEVICE KIT: 1.0000 | PACK | Freq: Four times a day (QID) | 0 refills | Status: DC

## 2019-06-06 MED ORDER — SODIUM CHLORIDE 0.9 % IV SOLN
375.0000 mg/m2 | Freq: Once | INTRAVENOUS | Status: AC
  Administered 2019-06-06: 800 mg via INTRAVENOUS
  Filled 2019-06-06: qty 50

## 2019-06-06 MED ORDER — ACETAMINOPHEN 325 MG PO TABS
ORAL_TABLET | ORAL | Status: AC
  Filled 2019-06-06: qty 2

## 2019-06-06 MED ORDER — DIPHENHYDRAMINE HCL 25 MG PO CAPS
50.0000 mg | ORAL_CAPSULE | Freq: Once | ORAL | Status: AC
  Administered 2019-06-06: 50 mg via ORAL

## 2019-06-06 MED ORDER — SODIUM CHLORIDE 0.9 % IV SOLN
Freq: Once | INTRAVENOUS | Status: AC
  Filled 2019-06-06: qty 250

## 2019-06-06 MED ORDER — DIPHENHYDRAMINE HCL 25 MG PO CAPS
ORAL_CAPSULE | ORAL | Status: AC
  Filled 2019-06-06: qty 2

## 2019-06-06 NOTE — Progress Notes (Unsigned)
   [facility] [address] [place], [place]  [address] Phone:  [phone]   Fax:  [phone] 

## 2019-06-06 NOTE — Assessment & Plan Note (Signed)
Relapsing ITP: Patient response to steroids and IVIG but because she is a diabetic we cannot use steroids.  IVIG response appeared to only last for a couple of months. Prior IVIG: March 2021, April 2021 Because of her history of heart failure I am also worried about the volume of fluid that she gets with IVIG. Lab review: Platelet count 16 on 05/16/2019 ----------------------------------------------------------------------------------------------------------------------------------------------------------------- Current treatment: Rituxan weekly x4 started 05/23/2019 (patient missed her treatment on 05/30/2019)

## 2019-06-06 NOTE — Telephone Encounter (Signed)
Called [facility] for inform that rx for meter was sent.

## 2019-06-06 NOTE — Telephone Encounter (Signed)
Blood Glucose Monitoring Suppl (PRODIGY AUTOCODE BLOOD GLUCOSE) w/Device KIT [[id number]]  Sent to [facility]

## 2019-06-06 NOTE — Patient Instructions (Signed)
[facility] °Discharge Instructions for Patients Receiving Chemotherapy ° °Today you received the following chemotherapy agents: Rituximab ° °To help prevent nausea and vomiting after your treatment, we encourage you to take your nausea medication as directed.  °  °If you develop nausea and vomiting that is not controlled by your nausea medication, call the clinic.  ° °BELOW ARE SYMPTOMS THAT SHOULD BE REPORTED IMMEDIATELY: °· *FEVER GREATER THAN 100.5 F °· *CHILLS WITH OR WITHOUT FEVER °· NAUSEA AND VOMITING THAT IS NOT CONTROLLED WITH YOUR NAUSEA MEDICATION °· *UNUSUAL SHORTNESS OF BREATH °· *UNUSUAL BRUISING OR BLEEDING °· TENDERNESS IN MOUTH AND THROAT WITH OR WITHOUT PRESENCE OF ULCERS °· *URINARY PROBLEMS °· *BOWEL PROBLEMS °· UNUSUAL RASH °Items with * indicate a potential emergency and should be followed up as soon as possible. ° °Feel free to call the clinic should you have any questions or concerns. The clinic phone number is [phone]. ° °Please show the CHEMO ALERT CARD at check-in to the Emergency Department and triage nurse. ° ° °

## 2019-06-11 ENCOUNTER — Inpatient Hospital Stay: Payer: Medicare Other

## 2019-06-11 ENCOUNTER — Other Ambulatory Visit: Payer: Self-pay | Admitting: Hematology and Oncology

## 2019-06-11 ENCOUNTER — Inpatient Hospital Stay (HOSPITAL_BASED_OUTPATIENT_CLINIC_OR_DEPARTMENT_OTHER): Payer: Medicare Other | Admitting: Medical

## 2019-06-11 ENCOUNTER — Inpatient Hospital Stay: Payer: Medicare Other | Admitting: Hematology and Oncology

## 2019-06-11 ENCOUNTER — Other Ambulatory Visit: Payer: Self-pay

## 2019-06-11 VITALS — BP 183/84 | HR 82 | Temp 97.9°F | Resp 20 | Ht 66.0 in

## 2019-06-11 DIAGNOSIS — D693 Immune thrombocytopenic purpura: Secondary | ICD-10-CM

## 2019-06-11 DIAGNOSIS — T80818A Extravasation of other vesicant agent, initial encounter: Secondary | ICD-10-CM

## 2019-06-11 DIAGNOSIS — Z5112 Encounter for antineoplastic immunotherapy: Secondary | ICD-10-CM | POA: Diagnosis not present

## 2019-06-11 MED ORDER — DEXTROSE 5 % IV SOLN
Freq: Once | INTRAVENOUS | Status: AC
  Filled 2019-06-11: qty 250

## 2019-06-11 MED ORDER — IMMUNE GLOBULIN (HUMAN) 10 GM/100ML IV SOLN
1.0000 g/kg | Freq: Once | INTRAVENOUS | Status: AC
  Administered 2019-06-11: 95 g via INTRAVENOUS
  Filled 2019-06-11: qty 800

## 2019-06-11 MED ORDER — DIPHENHYDRAMINE HCL 25 MG PO TABS
25.0000 mg | ORAL_TABLET | Freq: Once | ORAL | Status: AC
  Administered 2019-06-11: 25 mg via ORAL
  Filled 2019-06-11: qty 1

## 2019-06-11 MED ORDER — DIPHENHYDRAMINE HCL 25 MG PO CAPS
ORAL_CAPSULE | ORAL | Status: AC
  Filled 2019-06-11: qty 1

## 2019-06-11 MED ORDER — ACETAMINOPHEN 325 MG PO TABS
ORAL_TABLET | ORAL | Status: AC
  Filled 2019-06-11: qty 2

## 2019-06-11 MED ORDER — ACETAMINOPHEN 325 MG PO TABS
650.0000 mg | ORAL_TABLET | Freq: Once | ORAL | Status: AC
  Administered 2019-06-11: 650 mg via ORAL

## 2019-06-11 NOTE — Progress Notes (Signed)
1338--Patient stated her IV site "felt funny".  Upon assessment, IV site had infiltrated. Area around IV site appeared edematous. IVIG infusion stopped and Marga Hoots, PA notified. PIV was removed and ice applied to site. Marga Hoots, PA to see patient in infusion area.

## 2019-06-11 NOTE — Patient Instructions (Signed)
Immune Globulin Injection What is this medicine? IMMUNE GLOBULIN (im MUNE GLOB yoo lin) helps to prevent or reduce the severity of certain infections in patients who are at risk. This medicine is collected from the pooled blood of many donors. It is used to treat immune system problems, thrombocytopenia, and Kawasaki syndrome. This medicine may be used for other purposes; ask your health care provider or pharmacist if you have questions. COMMON BRAND NAME(S): ASCENIV, Baygam, BIVIGAM, Carimune, Carimune NF, cutaquig, Cuvitru, Flebogamma, Flebogamma DIF, GamaSTAN, GamaSTAN S/D, Gamimune N, Gammagard, Gammagard S/D, Gammaked, Gammaplex, Gammar-P IV, Gamunex, Gamunex-C, Hizentra, Iveegam, Iveegam EN, Octagam, Panglobulin, Panglobulin NF, panzyga, Polygam S/D, Privigen, Sandoglobulin, Venoglobulin-S, Vigam, Vivaglobulin, Xembify What should I tell my health care provider before I take this medicine? They need to know if you have any of these conditions:  diabetes  extremely low or no immune antibodies in the blood  heart disease  history of blood clots  hyperprolinemia  infection in the blood, sepsis  kidney disease  recently received or scheduled to receive a vaccination  an unusual or allergic reaction to human immune globulin, albumin, maltose, sucrose, other medicines, foods, dyes, or preservatives  pregnant or trying to get pregnant  breast-feeding How should I use this medicine? This medicine is for injection into a muscle or infusion into a vein or skin. It is usually given by a health care professional in a hospital or clinic setting. In rare cases, some brands of this medicine might be given at home. You will be taught how to give this medicine. Use exactly as directed. Take your medicine at regular intervals. Do not take your medicine more often than directed. Talk to your pediatrician regarding the use of this medicine in children. While this drug may be prescribed for selected  conditions, precautions do apply. Overdosage: If you think you have taken too much of this medicine contact a poison control center or emergency room at once. NOTE: This medicine is only for you. Do not share this medicine with others. What if I miss a dose? It is important not to miss your dose. Call your doctor or health care professional if you are unable to keep an appointment. If you give yourself the medicine and you miss a dose, take it as soon as you can. If it is almost time for your next dose, take only that dose. Do not take double or extra doses. What may interact with this medicine?  aspirin and aspirin-like medicines  cisplatin  cyclosporine  medicines for infection like acyclovir, adefovir, amphotericin B, bacitracin, cidofovir, foscarnet, ganciclovir, gentamicin, pentamidine, vancomycin  NSAIDS, medicines for pain and inflammation, like ibuprofen or naproxen  pamidronate  vaccines  zoledronic acid This list may not describe all possible interactions. Give your health care provider a list of all the medicines, herbs, non-prescription drugs, or dietary supplements you use. Also tell them if you smoke, drink alcohol, or use illegal drugs. Some items may interact with your medicine. What should I watch for while using this medicine? Your condition will be monitored carefully while you are receiving this medicine. This medicine is made from pooled blood donations of many different people. It may be possible to pass an infection in this medicine. However, the donors are screened for infections and all products are tested for HIV and hepatitis. The medicine is treated to kill most or all bacteria and viruses. Talk to your doctor about the risks and benefits of this medicine. Do not have vaccinations for at least   14 days before, or until at least 3 months after receiving this medicine. What side effects may I notice from receiving this medicine? Side effects that you should report  to your doctor or health care professional as soon as possible:  allergic reactions like skin rash, itching or hives, swelling of the face, lips, or tongue  blue colored lips or skin  breathing problems  chest pain or tightness  fever  signs and symptoms of aseptic meningitis such as stiff neck; sensitivity to light; headache; drowsiness; fever; nausea; vomiting; rash  signs and symptoms of a blood clot such as chest pain; shortness of breath; pain, swelling, or warmth in the leg  signs and symptoms of hemolytic anemia such as fast heartbeat; tiredness; dark yellow or brown urine; or yellowing of the eyes or skin  signs and symptoms of kidney injury like trouble passing urine or change in the amount of urine  sudden weight gain  swelling of the ankles, feet, hands Side effects that usually do not require medical attention (report to your doctor or health care professional if they continue or are bothersome):  diarrhea  flushing  headache  increased sweating  joint pain  muscle cramps  muscle pain  nausea  pain, redness, or irritation at site where injected  tiredness This list may not describe all possible side effects. Call your doctor for medical advice about side effects. You may report side effects to FDA at 1-800-FDA-1088. Where should I keep my medicine? Keep out of the reach of children. This drug is usually given in a hospital or clinic and will not be stored at home. In rare cases, some brands of this medicine may be given at home. If you are using this medicine at home, you will be instructed on how to store this medicine. Throw away any unused medicine after the expiration date on the label. NOTE: This sheet is a summary. It may not cover all possible information. If you have questions about this medicine, talk to your doctor, pharmacist, or health care provider.  2020 Elsevier/Gold Standard (2018-07-26 12:51:14)  

## 2019-06-12 ENCOUNTER — Telehealth: Payer: Self-pay | Admitting: Nurse Practitioner

## 2019-06-12 NOTE — Progress Notes (Signed)
   DATE:  06/11/2019      IV EXTRAVASATION (Neutral)  MD:  Dr. Serena Croissant  AGENT RECEIVED AT TIME OF EXTRAVASATION:   IVIG  IV SITE LOCATION: Left forearm  INTERVENTION:  1) IV stopped  2) Patient Teaching Instruction Sheet reviewed with [name].  A) Apply heat to area for 15 to 20 minutes 4 times daily for the next 48 hours B) Elevate the affected site for the next 48 hours. C) TATUM CORL was instructed to call [phone] if she has questions or notes acute changes to the area.  Review of Systems  Constitutional: Negative for chills, diaphoresis and fever.  HENT: Negative for trouble swallowing and voice change.   Respiratory: Negative for cough, chest tightness, shortness of breath and wheezing.   Cardiovascular: Negative for chest pain and palpitations.  Gastrointestinal: Negative for abdominal pain, constipation, diarrhea, nausea and vomiting.  Musculoskeletal: Negative for back pain and myalgias.  Skin: Negative for color change, pallor, rash and wound.  Neurological: Negative for dizziness, light-headedness and headaches.    Physical Exam Constitutional:      General: She is not in acute distress.    Appearance: Normal appearance. She is not ill-appearing.  HENT:     Head: Normocephalic and atraumatic.  Skin:    General: Skin is warm and dry.     Coloration: Skin is not jaundiced or pale.     Findings: No bruising, erythema or rash.     Comments: Induration proximal to an IV site in the left forearm.  Neurological:     Mental Status: She is alert.     Coordination: Coordination normal.    The patient would like to consider a Port-A-Cath.  Marga Hoots, MHS, PA-C

## 2019-06-12 NOTE — Telephone Encounter (Signed)
[facility] called needing verbal orders for pt. The call back number is [phone]

## 2019-06-13 NOTE — Telephone Encounter (Signed)
Please see what order is needed

## 2019-06-13 NOTE — Telephone Encounter (Signed)
Called and lvm detail voicemail about okay to continue home P/T as needed for patient  per Crystal King,NP.

## 2019-06-15 ENCOUNTER — Inpatient Hospital Stay: Payer: Medicare Other

## 2019-06-15 ENCOUNTER — Inpatient Hospital Stay: Payer: Medicare Other | Admitting: Hematology and Oncology

## 2019-06-15 NOTE — Assessment & Plan Note (Deleted)
Relapsing ITP: Patient response to steroids and IVIG but because she is a diabetic we cannot use steroids. IVIG response appeared to only last for a couple of months. Prior IVIG: March 2021, April 2021 Because of her history of heart failure I am also worried about the volume of fluid that she gets with IVIG. Lab review: Platelet count 16 on 05/16/2019 ----------------------------------------------------------------------------------------------------------------------------------------------------------------- Current treatment: Rituxan weekly x4 started 05/23/2019 (patient missed her treatment on 05/30/2019) Rituxan toxicities: Headaches and body aches after Rituxan. IVIG: 06/11/2019 and 06/15/2019  Lab review:  Recommended continuation of 2 more doses of Rituxan weekly for the next 2 weeks Patient needs eye surgery and we are waiting her and count improvement

## 2019-06-18 ENCOUNTER — Telehealth: Payer: Self-pay | Admitting: Nurse Practitioner

## 2019-06-18 ENCOUNTER — Inpatient Hospital Stay: Payer: Medicare Other

## 2019-06-18 ENCOUNTER — Other Ambulatory Visit: Payer: Self-pay | Admitting: Nurse Practitioner

## 2019-06-18 MED ORDER — ISOSORBIDE MONONITRATE ER 30 MG PO TB24: 30.0000 mg | ORAL_TABLET | Freq: Every day | ORAL | 1 refills | Status: DC

## 2019-06-18 MED ORDER — ROSUVASTATIN CALCIUM 20 MG PO TABS: 20.0000 mg | ORAL_TABLET | Freq: Every day | ORAL | 1 refills | Status: DC

## 2019-06-18 MED ORDER — METOPROLOL SUCCINATE ER 50 MG PO TB24: 50.0000 mg | ORAL_TABLET | Freq: Every day | ORAL | 1 refills | Status: DC

## 2019-06-18 NOTE — Telephone Encounter (Signed)
Sent!

## 2019-06-21 ENCOUNTER — Telehealth: Payer: Self-pay | Admitting: Hematology and Oncology

## 2019-06-21 NOTE — Telephone Encounter (Signed)
Scheduled appt per 6/17 staff message to reschedule 6/11 appts - pt is aware of new apt scheduled.

## 2019-06-22 ENCOUNTER — Other Ambulatory Visit: Payer: Self-pay

## 2019-06-25 ENCOUNTER — Telehealth: Payer: Self-pay | Admitting: Hematology and Oncology

## 2019-06-25 ENCOUNTER — Inpatient Hospital Stay (HOSPITAL_COMMUNITY)
Admission: EM | Admit: 2019-06-25 | Discharge: 2019-06-29 | DRG: 683 | Disposition: A | Discharge status: Home / Self Care | Payer: Medicare Other | Attending: Family Medicine | Admitting: Family Medicine

## 2019-06-25 ENCOUNTER — Other Ambulatory Visit: Payer: Medicare Other

## 2019-06-25 ENCOUNTER — Encounter (HOSPITAL_COMMUNITY): Payer: Self-pay

## 2019-06-25 ENCOUNTER — Ambulatory Visit: Payer: Medicare Other | Admitting: Hematology and Oncology

## 2019-06-25 ENCOUNTER — Ambulatory Visit: Payer: Medicare Other

## 2019-06-25 DIAGNOSIS — Z809 Family history of malignant neoplasm, unspecified: Secondary | ICD-10-CM

## 2019-06-25 DIAGNOSIS — D693 Immune thrombocytopenic purpura: Secondary | ICD-10-CM | POA: Diagnosis present

## 2019-06-25 DIAGNOSIS — E1122 Type 2 diabetes mellitus with diabetic chronic kidney disease: Secondary | ICD-10-CM | POA: Diagnosis present

## 2019-06-25 DIAGNOSIS — Z862 Personal history of diseases of the blood and blood-forming organs and certain disorders involving the immune mechanism: Secondary | ICD-10-CM

## 2019-06-25 DIAGNOSIS — E860 Dehydration: Secondary | ICD-10-CM | POA: Diagnosis present

## 2019-06-25 DIAGNOSIS — R112 Nausea with vomiting, unspecified: Secondary | ICD-10-CM

## 2019-06-25 DIAGNOSIS — Z89511 Acquired absence of right leg below knee: Secondary | ICD-10-CM

## 2019-06-25 DIAGNOSIS — I130 Hypertensive heart and chronic kidney disease with heart failure and stage 1 through stage 4 chronic kidney disease, or unspecified chronic kidney disease: Secondary | ICD-10-CM | POA: Diagnosis present

## 2019-06-25 DIAGNOSIS — Z87442 Personal history of urinary calculi: Secondary | ICD-10-CM

## 2019-06-25 DIAGNOSIS — N1832 Chronic kidney disease, stage 3b: Secondary | ICD-10-CM | POA: Diagnosis present

## 2019-06-25 DIAGNOSIS — K921 Melena: Secondary | ICD-10-CM | POA: Diagnosis present

## 2019-06-25 DIAGNOSIS — Z79899 Other long term (current) drug therapy: Secondary | ICD-10-CM

## 2019-06-25 DIAGNOSIS — I160 Hypertensive urgency: Secondary | ICD-10-CM | POA: Diagnosis present

## 2019-06-25 DIAGNOSIS — Z20822 Contact with and (suspected) exposure to covid-19: Secondary | ICD-10-CM | POA: Diagnosis present

## 2019-06-25 DIAGNOSIS — N189 Chronic kidney disease, unspecified: Secondary | ICD-10-CM | POA: Diagnosis present

## 2019-06-25 DIAGNOSIS — Z8673 Personal history of transient ischemic attack (TIA), and cerebral infarction without residual deficits: Secondary | ICD-10-CM

## 2019-06-25 DIAGNOSIS — R319 Hematuria, unspecified: Secondary | ICD-10-CM | POA: Diagnosis present

## 2019-06-25 DIAGNOSIS — Z833 Family history of diabetes mellitus: Secondary | ICD-10-CM

## 2019-06-25 DIAGNOSIS — N179 Acute kidney failure, unspecified: Secondary | ICD-10-CM | POA: Diagnosis present

## 2019-06-25 DIAGNOSIS — E119 Type 2 diabetes mellitus without complications: Secondary | ICD-10-CM

## 2019-06-25 DIAGNOSIS — R531 Weakness: Secondary | ICD-10-CM | POA: Diagnosis not present

## 2019-06-25 DIAGNOSIS — E785 Hyperlipidemia, unspecified: Secondary | ICD-10-CM | POA: Diagnosis present

## 2019-06-25 DIAGNOSIS — E1165 Type 2 diabetes mellitus with hyperglycemia: Secondary | ICD-10-CM | POA: Diagnosis present

## 2019-06-25 DIAGNOSIS — Z89519 Acquired absence of unspecified leg below knee: Secondary | ICD-10-CM

## 2019-06-25 DIAGNOSIS — Z8249 Family history of ischemic heart disease and other diseases of the circulatory system: Secondary | ICD-10-CM

## 2019-06-25 DIAGNOSIS — N200 Calculus of kidney: Secondary | ICD-10-CM | POA: Diagnosis present

## 2019-06-25 DIAGNOSIS — Z83438 Family history of other disorder of lipoprotein metabolism and other lipidemia: Secondary | ICD-10-CM

## 2019-06-25 DIAGNOSIS — I5032 Chronic diastolic (congestive) heart failure: Secondary | ICD-10-CM | POA: Diagnosis present

## 2019-06-25 DIAGNOSIS — D696 Thrombocytopenia, unspecified: Secondary | ICD-10-CM

## 2019-06-25 DIAGNOSIS — Z7984 Long term (current) use of oral hypoglycemic drugs: Secondary | ICD-10-CM

## 2019-06-25 DIAGNOSIS — G629 Polyneuropathy, unspecified: Secondary | ICD-10-CM | POA: Diagnosis present

## 2019-06-25 DIAGNOSIS — I10 Essential (primary) hypertension: Secondary | ICD-10-CM

## 2019-06-25 MED ORDER — ONDANSETRON 4 MG PO TBDP
4.0000 mg | ORAL_TABLET | Freq: Once | ORAL | Status: AC | PRN
  Administered 2019-06-25: 4 mg via ORAL
  Filled 2019-06-25: qty 1

## 2019-06-25 MED ORDER — SODIUM CHLORIDE 0.9% FLUSH
3.0000 mL | Freq: Once | INTRAVENOUS | Status: AC
  Administered 2019-06-26: 3 mL via INTRAVENOUS

## 2019-06-25 NOTE — Telephone Encounter (Signed)
Called the patient due to her not showing up for her 1000 appointment and her 1145 appointment this morning. Mother answered her phone stating that the patient is not feeling well and that she was not coming in. Patients appointments were cancelled.   Greggory Keen

## 2019-06-25 NOTE — Assessment & Plan Note (Deleted)
Relapsing ITP: Patient response to steroids and IVIG but because she is a diabetic we cannot use steroids. IVIG response appeared to only last for a couple of months. Prior IVIG: March 2021, April 2021 Because of her history of heart failure I am also worried about the volume of fluid that she gets with IVIG. ---------------------------------------------------------------------------------------------------------------------------------------------------------------- Current treatment: Rituxan weekly x4 started 05/23/2019 (patient missed her treatment on 05/30/2019); IVIG given on 06/11/2019 Rituxan toxicities: Headaches and body aches after Rituxan. She needs eye surgery done.  We are waiting for platelet count recovery.  Lab review: Platelet count 9 on 06/06/2019

## 2019-06-25 NOTE — ED Triage Notes (Signed)
Pt bib gcems w/ c/o n/v and generalized weakness. Pt has hx of low platelets and gets infusion once a week. Pt has not received infusion in 2 weeks.

## 2019-06-26 ENCOUNTER — Other Ambulatory Visit: Payer: Self-pay

## 2019-06-26 ENCOUNTER — Emergency Department (HOSPITAL_COMMUNITY): Payer: Medicare Other

## 2019-06-26 DIAGNOSIS — Z79899 Other long term (current) drug therapy: Secondary | ICD-10-CM | POA: Diagnosis not present

## 2019-06-26 DIAGNOSIS — G629 Polyneuropathy, unspecified: Secondary | ICD-10-CM | POA: Diagnosis present

## 2019-06-26 DIAGNOSIS — Z20822 Contact with and (suspected) exposure to covid-19: Secondary | ICD-10-CM | POA: Diagnosis present

## 2019-06-26 DIAGNOSIS — E785 Hyperlipidemia, unspecified: Secondary | ICD-10-CM | POA: Diagnosis present

## 2019-06-26 DIAGNOSIS — R531 Weakness: Secondary | ICD-10-CM | POA: Diagnosis present

## 2019-06-26 DIAGNOSIS — Z83438 Family history of other disorder of lipoprotein metabolism and other lipidemia: Secondary | ICD-10-CM | POA: Diagnosis not present

## 2019-06-26 DIAGNOSIS — Z833 Family history of diabetes mellitus: Secondary | ICD-10-CM | POA: Diagnosis not present

## 2019-06-26 DIAGNOSIS — Z8673 Personal history of transient ischemic attack (TIA), and cerebral infarction without residual deficits: Secondary | ICD-10-CM | POA: Diagnosis not present

## 2019-06-26 DIAGNOSIS — Z89511 Acquired absence of right leg below knee: Secondary | ICD-10-CM | POA: Diagnosis not present

## 2019-06-26 DIAGNOSIS — E860 Dehydration: Secondary | ICD-10-CM | POA: Diagnosis present

## 2019-06-26 DIAGNOSIS — K921 Melena: Secondary | ICD-10-CM | POA: Diagnosis present

## 2019-06-26 DIAGNOSIS — Z809 Family history of malignant neoplasm, unspecified: Secondary | ICD-10-CM | POA: Diagnosis not present

## 2019-06-26 DIAGNOSIS — D693 Immune thrombocytopenic purpura: Secondary | ICD-10-CM | POA: Diagnosis present

## 2019-06-26 DIAGNOSIS — N189 Chronic kidney disease, unspecified: Secondary | ICD-10-CM | POA: Diagnosis not present

## 2019-06-26 DIAGNOSIS — N179 Acute kidney failure, unspecified: Secondary | ICD-10-CM | POA: Diagnosis present

## 2019-06-26 DIAGNOSIS — N200 Calculus of kidney: Secondary | ICD-10-CM | POA: Diagnosis present

## 2019-06-26 DIAGNOSIS — R109 Unspecified abdominal pain: Secondary | ICD-10-CM

## 2019-06-26 DIAGNOSIS — E1159 Type 2 diabetes mellitus with other circulatory complications: Secondary | ICD-10-CM | POA: Diagnosis not present

## 2019-06-26 DIAGNOSIS — E1165 Type 2 diabetes mellitus with hyperglycemia: Secondary | ICD-10-CM | POA: Diagnosis present

## 2019-06-26 DIAGNOSIS — Z8249 Family history of ischemic heart disease and other diseases of the circulatory system: Secondary | ICD-10-CM | POA: Diagnosis not present

## 2019-06-26 DIAGNOSIS — I5032 Chronic diastolic (congestive) heart failure: Secondary | ICD-10-CM | POA: Diagnosis present

## 2019-06-26 DIAGNOSIS — R319 Hematuria, unspecified: Secondary | ICD-10-CM | POA: Diagnosis present

## 2019-06-26 DIAGNOSIS — R112 Nausea with vomiting, unspecified: Secondary | ICD-10-CM | POA: Diagnosis not present

## 2019-06-26 DIAGNOSIS — I160 Hypertensive urgency: Secondary | ICD-10-CM | POA: Diagnosis present

## 2019-06-26 DIAGNOSIS — I130 Hypertensive heart and chronic kidney disease with heart failure and stage 1 through stage 4 chronic kidney disease, or unspecified chronic kidney disease: Secondary | ICD-10-CM | POA: Diagnosis present

## 2019-06-26 DIAGNOSIS — N1832 Chronic kidney disease, stage 3b: Secondary | ICD-10-CM | POA: Diagnosis present

## 2019-06-26 DIAGNOSIS — Z87442 Personal history of urinary calculi: Secondary | ICD-10-CM | POA: Diagnosis not present

## 2019-06-26 DIAGNOSIS — E1122 Type 2 diabetes mellitus with diabetic chronic kidney disease: Secondary | ICD-10-CM | POA: Diagnosis present

## 2019-06-26 DIAGNOSIS — Z7984 Long term (current) use of oral hypoglycemic drugs: Secondary | ICD-10-CM | POA: Diagnosis not present

## 2019-06-26 LAB — CBC
HCT: 46.3 % — ABNORMAL HIGH (ref 36.0–46.0)
Hemoglobin: 14.6 g/dL (ref 12.0–15.0)
MCH: 26.9 pg (ref 26.0–34.0)
MCHC: 31.5 g/dL (ref 30.0–36.0)
MCV: 85.4 fL (ref 80.0–100.0)
Platelets: 46 10*3/uL — ABNORMAL LOW (ref 150–400)
RBC: 5.42 MIL/uL — ABNORMAL HIGH (ref 3.87–5.11)
RDW: 19.9 % — ABNORMAL HIGH (ref 11.5–15.5)
WBC: 7.5 10*3/uL (ref 4.0–10.5)
nRBC: 0 % (ref 0.0–0.2)

## 2019-06-26 LAB — COMPREHENSIVE METABOLIC PANEL
ALT: 21 U/L (ref 0–44)
AST: 30 U/L (ref 15–41)
Albumin: 3.8 g/dL (ref 3.5–5.0)
Alkaline Phosphatase: 92 U/L (ref 38–126)
Anion gap: 13 (ref 5–15)
BUN: 39 mg/dL — ABNORMAL HIGH (ref 6–20)
CO2: 24 mmol/L (ref 22–32)
Calcium: 9.4 mg/dL (ref 8.9–10.3)
Chloride: 98 mmol/L (ref 98–111)
Creatinine, Ser: 2.14 mg/dL — ABNORMAL HIGH (ref 0.44–1.00)
GFR calc Af Amer: 30 mL/min — ABNORMAL LOW (ref >60)
GFR calc non Af Amer: 26 mL/min — ABNORMAL LOW (ref >60)
Glucose, Bld: 381 mg/dL — ABNORMAL HIGH (ref 70–99)
Potassium: 4.3 mmol/L (ref 3.5–5.1)
Sodium: 135 mmol/L (ref 135–145)
Total Bilirubin: 1 mg/dL (ref 0.3–1.2)
Total Protein: 9 g/dL — ABNORMAL HIGH (ref 6.5–8.1)

## 2019-06-26 LAB — CBG MONITORING, ED
Glucose-Capillary: 329 mg/dL — ABNORMAL HIGH (ref 70–99)
Glucose-Capillary: 251 mg/dL — ABNORMAL HIGH (ref 70–99)
Glucose-Capillary: 192 mg/dL — ABNORMAL HIGH (ref 70–99)

## 2019-06-26 LAB — URINALYSIS, ROUTINE W REFLEX MICROSCOPIC
Bacteria, UA: NONE SEEN
Bilirubin Urine: NEGATIVE
Glucose, UA: >=500 mg/dL — AB
Ketones, ur: 5 mg/dL — AB
Leukocytes,Ua: NEGATIVE
Nitrite: NEGATIVE
Protein, ur: >=300 mg/dL — AB
RBC / HPF: >50 RBC/hpf — ABNORMAL HIGH (ref 0–5)
Specific Gravity, Urine: 1.025 (ref 1.005–1.030)
pH: 6 (ref 5.0–8.0)

## 2019-06-26 LAB — GLUCOSE, CAPILLARY: Glucose-Capillary: 198 mg/dL — ABNORMAL HIGH (ref 70–99)

## 2019-06-26 LAB — TYPE AND SCREEN
ABO/RH(D): B POS
Antibody Screen: NEGATIVE

## 2019-06-26 LAB — LIPASE, BLOOD: Lipase: 23 U/L (ref 11–51)

## 2019-06-26 LAB — SARS CORONAVIRUS 2 BY RT PCR (HOSPITAL ORDER, PERFORMED IN ~~LOC~~ HOSPITAL LAB): SARS Coronavirus 2: NEGATIVE

## 2019-06-26 MED ORDER — METOPROLOL SUCCINATE ER 50 MG PO TB24
50.0000 mg | ORAL_TABLET | Freq: Every day | ORAL | Status: DC
  Administered 2019-06-26 – 2019-06-29 (×4): 50 mg via ORAL
  Filled 2019-06-26: qty 1
  Filled 2019-06-26: qty 2
  Filled 2019-06-26 (×2): qty 1

## 2019-06-26 MED ORDER — HYDRALAZINE HCL 20 MG/ML IJ SOLN
10.0000 mg | INTRAMUSCULAR | Status: DC | PRN
  Administered 2019-06-28: 10 mg via INTRAVENOUS
  Filled 2019-06-26: qty 1

## 2019-06-26 MED ORDER — ROSUVASTATIN CALCIUM 20 MG PO TABS
20.0000 mg | ORAL_TABLET | Freq: Every day | ORAL | Status: DC
  Administered 2019-06-26 – 2019-06-29 (×4): 20 mg via ORAL
  Filled 2019-06-26 (×2): qty 1
  Filled 2019-06-26: qty 4
  Filled 2019-06-26: qty 1

## 2019-06-26 MED ORDER — ONDANSETRON HCL 4 MG/2ML IJ SOLN
4.0000 mg | Freq: Once | INTRAMUSCULAR | Status: AC
  Administered 2019-06-26: 4 mg via INTRAVENOUS
  Filled 2019-06-26: qty 2

## 2019-06-26 MED ORDER — ACETAMINOPHEN 325 MG PO TABS
650.0000 mg | ORAL_TABLET | Freq: Once | ORAL | Status: AC
  Administered 2019-06-27: 650 mg via ORAL
  Filled 2019-06-26: qty 2

## 2019-06-26 MED ORDER — ONDANSETRON HCL 4 MG/2ML IJ SOLN
4.0000 mg | Freq: Four times a day (QID) | INTRAMUSCULAR | Status: DC | PRN
  Administered 2019-06-26: 4 mg via INTRAVENOUS
  Filled 2019-06-26: qty 2

## 2019-06-26 MED ORDER — ONDANSETRON HCL 4 MG PO TABS
4.0000 mg | ORAL_TABLET | Freq: Four times a day (QID) | ORAL | Status: DC | PRN
  Administered 2019-06-26: 4 mg via ORAL
  Filled 2019-06-26: qty 1

## 2019-06-26 MED ORDER — ONDANSETRON 4 MG PO TBDP
4.0000 mg | ORAL_TABLET | Freq: Three times a day (TID) | ORAL | 0 refills | Status: DC | PRN
Start: 2019-06-26 — End: 2019-08-08

## 2019-06-26 MED ORDER — ACETAMINOPHEN 650 MG RE SUPP: 650.0000 mg | Freq: Four times a day (QID) | RECTAL | Status: DC | PRN

## 2019-06-26 MED ORDER — ACETAMINOPHEN 325 MG PO TABS
650.0000 mg | ORAL_TABLET | Freq: Four times a day (QID) | ORAL | Status: DC | PRN
  Administered 2019-06-26 – 2019-06-28 (×3): 650 mg via ORAL
  Filled 2019-06-26 (×3): qty 2

## 2019-06-26 MED ORDER — DIPHENHYDRAMINE HCL 25 MG PO CAPS
25.0000 mg | ORAL_CAPSULE | Freq: Once | ORAL | Status: AC
  Administered 2019-06-27: 25 mg via ORAL
  Filled 2019-06-26: qty 1

## 2019-06-26 MED ORDER — GABAPENTIN 300 MG PO CAPS
300.0000 mg | ORAL_CAPSULE | Freq: Two times a day (BID) | ORAL | Status: DC
  Administered 2019-06-26 – 2019-06-29 (×7): 300 mg via ORAL
  Filled 2019-06-26 (×7): qty 1

## 2019-06-26 MED ORDER — SODIUM CHLORIDE 0.9 % IV SOLN: Freq: Once | INTRAVENOUS | Status: AC

## 2019-06-26 MED ORDER — SODIUM CHLORIDE 0.9% FLUSH
3.0000 mL | Freq: Two times a day (BID) | INTRAVENOUS | Status: DC
  Administered 2019-06-27 – 2019-06-28 (×3): 3 mL via INTRAVENOUS

## 2019-06-26 MED ORDER — ISOSORBIDE MONONITRATE ER 30 MG PO TB24
30.0000 mg | ORAL_TABLET | Freq: Every day | ORAL | Status: DC
  Administered 2019-06-26 – 2019-06-29 (×4): 30 mg via ORAL
  Filled 2019-06-26 (×4): qty 1

## 2019-06-26 MED ORDER — HYDRALAZINE HCL 20 MG/ML IJ SOLN
10.0000 mg | Freq: Once | INTRAMUSCULAR | Status: AC
  Administered 2019-06-26: 10 mg via INTRAVENOUS
  Filled 2019-06-26: qty 1

## 2019-06-26 MED ORDER — INSULIN ASPART 100 UNIT/ML ~~LOC~~ SOLN
0.0000 [IU] | Freq: Three times a day (TID) | SUBCUTANEOUS | Status: DC
  Administered 2019-06-26: 5 [IU] via SUBCUTANEOUS
  Administered 2019-06-26: 7 [IU] via SUBCUTANEOUS
  Administered 2019-06-26: 2 [IU] via SUBCUTANEOUS
  Administered 2019-06-27: 1 [IU] via SUBCUTANEOUS
  Administered 2019-06-27 (×2): 2 [IU] via SUBCUTANEOUS
  Administered 2019-06-28: 3 [IU] via SUBCUTANEOUS
  Administered 2019-06-28 (×2): 2 [IU] via SUBCUTANEOUS
  Administered 2019-06-29: 5 [IU] via SUBCUTANEOUS
  Administered 2019-06-29: 3 [IU] via SUBCUTANEOUS

## 2019-06-26 MED ORDER — PANTOPRAZOLE SODIUM 40 MG IV SOLR
40.0000 mg | Freq: Two times a day (BID) | INTRAVENOUS | Status: DC
  Administered 2019-06-26 – 2019-06-27 (×4): 40 mg via INTRAVENOUS
  Filled 2019-06-26 (×4): qty 40

## 2019-06-26 MED ORDER — IMMUNE GLOBULIN (HUMAN) 20 GM/200ML IV SOLN
1.0000 g/kg | Freq: Once | INTRAVENOUS | Status: AC
  Administered 2019-06-27: 95 g via INTRAVENOUS
  Filled 2019-06-26: qty 100

## 2019-06-26 MED ORDER — ALBUTEROL SULFATE (2.5 MG/3ML) 0.083% IN NEBU: 2.5000 mg | INHALATION_SOLUTION | Freq: Four times a day (QID) | RESPIRATORY_TRACT | Status: DC | PRN

## 2019-06-26 MED ORDER — SODIUM CHLORIDE 0.9 % IV BOLUS
1000.0000 mL | Freq: Once | INTRAVENOUS | Status: AC
  Administered 2019-06-26: 1000 mL via INTRAVENOUS

## 2019-06-26 NOTE — ED Notes (Signed)
CBG Results of 192 reported to [place], Charity fundraiser.

## 2019-06-26 NOTE — Progress Notes (Signed)
Relapsed refractory acute ITP: Patient was able to receive 2 doses of Rituxan and 1 dose of IVIG.  She was unable to come in for second dose of IVIG appointments.  Therefore since she is in the hospital, we will administer her second dose of IVIG today.

## 2019-06-26 NOTE — Discharge Instructions (Addendum)
You were seen today for generalized weakness and nausea and vomiting as well as concern for low platelets.  Your platelet count is actually elevated from prior.  Your work-up is notable for some evidence of dehydration with acute kidney injury.  Make sure that you are staying hydrated at home.  Take Zofran as needed for nausea.  Your CT scan was reassuring.  Follow-up with your hematologist regarding your thrombocytopenia.

## 2019-06-26 NOTE — ED Notes (Signed)
Lunch Tray Ordered @ 1048. °

## 2019-06-26 NOTE — ED Notes (Signed)
PT transported to Xray

## 2019-06-26 NOTE — H&P (Signed)
History and Physical    [name] IDP:[id number] DOB: March 30, 1965 DOA: 06/25/2019  Referring MD/NP/PA: Jennette Kettle, MD PCP: Nicholas Lose, MD  Patient coming from: home via EMS  Chief Complaint: Nausea and vomiting  I have personally briefly reviewed patient's old medical records in [name]   HPI: [name] is a 54 y.o. female with medical history significant of hypertension, DM type II, ITP, anemia, nephrolithiasis, and s/p right BKA presents with complaints of nausea and vomiting over the last 3 days.  Emesis is noted to be nonbloody in appearance.  Unable to keep any food or liquids down.  Associated symptoms include midline abdominal pain that does not radiate, constipation with last bowel movement prior to the onset of symptoms, generalized weakness, blood in stool, and dysuria.  Notes dysuria usually occurs when platelet counts are low.  She had been receiving infusions of immune globulin due to an acute flare of her ITP since april, but she had missed her last 2 appointments due to transportation issues and yesterday as she was here in the hospital.  Denies having any fevers, cough, chest pain, shortness of breath  ED Course: Upon admission into the emergency department patient was seen to be afebrile with pulse 102-115, respiration 14-27, blood pressure elevated up to 222/105, and O2 saturations maintained on room air.  Labs significant for platelets 46, BUN 39, creatinine 2.14(baseline Cr around 1.21), and glucose 381.  LFTs and lipase are within normal limits.  Abdominal series x-ray noted stable cardiomegaly without signs of obstruction.  Urinalysis significant for large hemoglobin, greater than 50 RBCs/hpf, and 21-50 WBCs.  CT of the abdomen and pelvis did not note any acute abnormalities and a 3 mm nonobstructing right renal calculus.  Patient was given 1 L normal saline IV fluids, antiemetics, hydralazine 10 mg IV, and metoprolol 50 mg p.o. COVID-19 screening  negative.   Review of Systems  Constitutional: Positive for malaise/fatigue. Negative for fever.  HENT: Negative for congestion.   Eyes: Negative for double vision and photophobia.  Respiratory: Negative for cough and shortness of breath.   Cardiovascular: Negative for chest pain and leg swelling.  Gastrointestinal: Positive for abdominal pain, blood in stool, constipation, nausea and vomiting.  Genitourinary: Positive for dysuria. Negative for flank pain.  Musculoskeletal: Negative for joint pain and myalgias.  Neurological: Positive for weakness. Negative for focal weakness.  Psychiatric/Behavioral: Negative for hallucinations and substance abuse.    Past Medical History:  Diagnosis Date  . Acute exacerbation of CHF (congestive heart failure) ([place]) 04/11/2019  . Anemia   . Arthritis    "spine, hands" (11/25/2015)  . Back pain    "all my back; probably 3 times/week" (11/25/2015)  . Chronic indwelling Foley catheter 03/04/2017  . Diabetic foot ulcer associated with type 2 diabetes mellitus ([place])   . Hypertension   . Intracerebral hemorrhage ([place]) As a teenager    States she had burst blood vessel as teenager, now with resultant minor visual field and hearing deficits  . Neuropathy   . Stroke [facility]) ~ 1982   "my feeling on my RLE, right lower eye vision,  & hearing out of my right ear not the same since" (11/25/2015)  . Type II diabetes mellitus ([address])     Past Surgical History:  Procedure Laterality Date  . AMPUTATION  11/24/2010   Procedure: AMPUTATION FOOT;  Surgeon: Wylene Simmer, MD;  Location: [place];  Service: Orthopedics;  Laterality: Right;  Right  FOOT TRANS METATARSAL AMPUTATION  .  AMPUTATION  07/02/2011   Procedure: AMPUTATION BELOW KNEE;  Surgeon: Wylene Simmer, MD;  Location: [place];  Service: Orthopedics;  Laterality: Right;  . APPLICATION OF WOUND VAC  07/02/2011   Procedure: APPLICATION OF WOUND VAC;  Surgeon: Wylene Simmer, MD;  Location: [place];  Service: Orthopedics;   Laterality: Right;  . BRAIN SURGERY  1980's   Previous brain surgery in [place], [place]  . CESAREAN SECTION  2004; 2006  . COLONOSCOPY N/A 09/17/2015   Procedure: COLONOSCOPY;  Surgeon: Wilford Corner, MD;  Location[facility];  Service: Endoscopy;  Laterality: N/A;  . ESOPHAGOGASTRODUODENOSCOPY N/A 09/17/2015   Procedure: ESOPHAGOGASTRODUODENOSCOPY (EGD);  Surgeon: Wilford Corner, MD;  Location: [facility] ENDOSCOPY;  Service: Endoscopy;  Laterality: N/A;  . I & D EXTREMITY  11/24/2010   Procedure: IRRIGATION AND DEBRIDEMENT EXTREMITY;  Surgeon: Wylene Simmer, MD;  Location: [place];  Service: Orthopedics;  Laterality: Left;  Debriedment of left plantar ulcer and trimming of toenails  . I & D EXTREMITY  07/02/2011   Procedure: IRRIGATION AND DEBRIDEMENT EXTREMITY;  Surgeon: Wylene Simmer, MD;  Location: [place];  Service: Orthopedics;  Laterality: Left;  I & D of Left Foot  . I & D EXTREMITY  07/06/2011   Procedure: IRRIGATION AND DEBRIDEMENT EXTREMITY;  Surgeon: Wylene Simmer, MD;  Location: [place];  Service: Orthopedics;  Laterality: Right;  IRRIGATION/DEBRIDEMENT RIGHT BELOW KNEE AMPUTATION  . TUBAL LIGATION  2006     reports that she has never smoked. She has never used smokeless tobacco. She reports current alcohol use of about 2.0 standard drinks of alcohol per week. She reports that she does not use drugs.  No Known Allergies  Family History  Problem Relation Age of Onset  . Diabetes Mother   . Hypertension Mother   . Hyperlipidemia Mother   . Diabetes Father   . Cancer Father        prostate  . Hyperlipidemia Father   . Hypertension Father   . Diabetes Brother   . Peripheral Artery Disease Brother        has had toes amputated  . Diabetes Son     Prior to Admission medications   Medication Sig Start Date End Date Taking? Authorizing Provider  acetaminophen (TYLENOL) 500 MG tablet Take 1,000 mg by mouth every 6 (six) hours as needed for mild pain.   Yes [provider]    furosemide (LASIX) 40 MG tablet Take 1 tablet (40 mg total) by mouth 2 (two) times daily. 04/19/19  Yes Florencia Reasons, MD  gabapentin (NEURONTIN) 400 MG capsule Take 1 capsule (400 mg total) by mouth 2 (two) times daily. 04/19/19  Yes Florencia Reasons, MD  isosorbide mononitrate (IMDUR) 30 MG 24 hr tablet Take 1 tablet (30 mg total) by mouth daily. 06/18/19 12/15/19 Yes Vevelyn Francois, NP  metFORMIN (GLUCOPHAGE) 500 MG tablet Take 1 tablet (500 mg total) by mouth 2 (two) times daily with a meal. Patient taking differently: Take 500 mg by mouth daily with breakfast.  11/13/18 06/26/19 Yes Nita Sells, MD  metoprolol succinate (TOPROL-XL) 50 MG 24 hr tablet Take 1 tablet (50 mg total) by mouth daily. Take with or immediately following a meal. 06/18/19 12/15/19 Yes King, Diona Foley, NP  rosuvastatin (CRESTOR) 20 MG tablet Take 1 tablet (20 mg total) by mouth daily. 06/18/19 12/15/19 Yes Vevelyn Francois, NP  Blood Glucose Monitoring Suppl (PRODIGY AUTOCODE BLOOD GLUCOSE) w/Device KIT 1 kit by Does not apply route in the morning, at noon, in  the evening, and at bedtime. 06/06/19 06/05/20  Vevelyn Francois, NP  ondansetron (ZOFRAN ODT) 4 MG disintegrating tablet Take 1 tablet (4 mg total) by mouth every 8 (eight) hours as needed for nausea or vomiting. 06/26/19   Horton, Barbette Hair, MD    Physical Exam:  Constitutional: Middle-aged female who appears to be ill Vitals:   06/26/19 0417 06/26/19 0516 06/26/19 0534 06/26/19 0645  BP: (!) 197/94 (!) 204/99  (!) 185/85  Pulse: (!) 105 (!) 105  (!) 108  Resp: 18 18  (!) 21  Temp:   98.1 F (36.7 C)   TempSrc:   Oral   SpO2: 99% 100%  98%   Eyes: PERRL, lids and conjunctivae normal ENMT: Mucous membranes are moist. Posterior pharynx clear of any exudate or lesions.  Neck: normal, supple, no masses, no thyromegaly Respiratory: Clear to auscultation bilaterally.  O2 saturations maintained. Cardiovascular: Tachycardia, no murmurs / rubs / gallops. No extremity edema. 2+  pedal pulses. No carotid bruits.  Abdomen: no tenderness, no masses palpated. No hepatosplenomegaly. Bowel sounds positive.  Musculoskeletal: no clubbing / cyanosis. No joint deformity upper and lower extremities. Good ROM, no contractures. Normal muscle tone.  Skin: no rashes, lesions, ulcers. No induration Neurologic: CN 2-12 grossly intact. Sensation intact, DTR normal. Strength 5/5 in all 4.  Psychiatric: Normal judgment and insight. Alert and oriented x 3. Normal mood.     Labs on Admission: I have personally reviewed following labs and imaging studies  CBC: Recent Labs  Lab 06/26/19 0124  WBC 7.5  HGB 14.6  HCT 46.3*  MCV 85.4  PLT 46*   Basic Metabolic Panel: Recent Labs  Lab 06/26/19 0124  NA 135  K 4.3  CL 98  CO2 24  GLUCOSE 381*  BUN 39*  CREATININE 2.14*  CALCIUM 9.4   GFR: CrCl cannot be calculated (Unknown ideal weight.). Liver Function Tests: Recent Labs  Lab 06/26/19 0124  AST 30  ALT 21  ALKPHOS 92  BILITOT 1.0  PROT 9.0*  ALBUMIN 3.8   Recent Labs  Lab 06/26/19 0124  LIPASE 23   No results for input(s): AMMONIA in the last 168 hours. Coagulation Profile: No results for input(s): INR, PROTIME in the last 168 hours. Cardiac Enzymes: No results for input(s): CKTOTAL, CKMB, CKMBINDEX, TROPONINI in the last 168 hours. BNP (last 3 results) No results for input(s): PROBNP in the last 8760 hours. HbA1C: No results for input(s): HGBA1C in the last 72 hours. CBG: No results for input(s): GLUCAP in the last 168 hours. Lipid Profile: No results for input(s): CHOL, HDL, LDLCALC, TRIG, CHOLHDL, LDLDIRECT in the last 72 hours. Thyroid Function Tests: No results for input(s): TSH, T4TOTAL, FREET4, T3FREE, THYROIDAB in the last 72 hours. Anemia Panel: No results for input(s): VITAMINB12, FOLATE, FERRITIN, TIBC, IRON, RETICCTPCT in the last 72 hours. Urine analysis:    Component Value Date/Time   COLORURINE YELLOW 06/26/2019 0250   APPEARANCEUR  HAZY (A) 06/26/2019 0250   LABSPEC 1.025 06/26/2019 0250   PHURINE 6.0 06/26/2019 0250   GLUCOSEU >=500 (A) 06/26/2019 0250   HGBUR LARGE (A) 06/26/2019 0250   BILIRUBINUR NEGATIVE 06/26/2019 0250   KETONESUR 5 (A) 06/26/2019 0250   PROTEINUR >=300 (A) 06/26/2019 0250   UROBILINOGEN 2.0 (H) 07/08/2011 0051   NITRITE NEGATIVE 06/26/2019 0250   LEUKOCYTESUR NEGATIVE 06/26/2019 0250   Sepsis Labs: No results found for this or any previous visit (from the past 240 hour(s)).   Radiological Exams on Admission: CT  ABDOMEN PELVIS WO CONTRAST  Result Date: 06/26/2019 CLINICAL DATA:  Nausea and vomiting EXAM: CT ABDOMEN AND PELVIS WITHOUT CONTRAST TECHNIQUE: Multidetector CT imaging of the abdomen and pelvis was performed following the standard protocol without IV contrast. COMPARISON:  Abdominal radiograph 06/26/2019 08/29/2015 CT abdomen pelvis FINDINGS: LOWER CHEST: Normal. HEPATOBILIARY: Normal hepatic contours. No intra- or extrahepatic biliary dilatation. The gallbladder is normal. PANCREAS: Normal pancreas. No ductal dilatation or peripancreatic fluid collection. SPLEEN: Normal. ADRENALS/URINARY TRACT: The adrenal glands are normal. 3 mm right renal interpolar calculus. No hydronephrosis. The urinary bladder is normal for degree of distention STOMACH/BOWEL: There is no hiatal hernia. Normal duodenal course and caliber. No small bowel dilatation or inflammation. No focal colonic abnormality. Normal appendix. VASCULAR/LYMPHATIC: Normal course and caliber of the major abdominal vessels. No abdominal or pelvic lymphadenopathy. REPRODUCTIVE: Normal uterus.  No adnexal mass. MUSCULOSKELETAL. No bony spinal canal stenosis or focal osseous abnormality. OTHER: None. IMPRESSION: 1. No acute abnormality of the abdomen or pelvis. 2. 3 mm nonobstructive right renal interpolar calculus. Electronically Signed   By: Ulyses Jarred M.D.   On: 06/26/2019 03:51   DG Abdomen Acute W/Chest  Result Date:  06/26/2019 CLINICAL DATA:  Vomiting and abdominal pain. EXAM: DG ABDOMEN ACUTE W/ 1V CHEST COMPARISON:  Chest radiograph 05/16/2019 FINDINGS: Stable cardiomegaly. No acute airspace disease. No pleural fluid. No free intra-abdominal air. No bowel dilatation to suggest obstruction. There are no radiopaque calculi or abnormal soft tissue calcifications. Vascular calcifications. Small to moderate volume of colonic stool. Mild degenerative change of the hips. IMPRESSION: 1. Normal bowel gas pattern. No free air. 2. Stable cardiomegaly. Electronically Signed   By: Keith Rake M.D.   On: 06/26/2019 02:28    EKG: Independently reviewed.  Sinus tachycardia 108 bpm with biatrial enlargement.  Assessment/Plan Nausea and vomiting Abdominal pain: Acute.  She presents with complaints of midline abdominal pain, nausea and vomiting.  Emesis noted to be nonbloody in appearance.  CT scan of the abdomen and pelvis negative for any clear cause of symptoms.  Question possibility of gastritis as cause of symptoms. -Admit to a medical telemetry bed -Antiemetics as needed -Clear liquid diet and advance as tolerated -Continue IV fluids as seen below -Protonix IV  Hypertensive urgency: Acute.  On admission blood pressures elevated up to 222/105. Home blood pressure medications include metoprolol 50 mg daily, furosemide 40 mg twice daily, and isosorbide mononitrate 30 mg daily. -Continue metoprolol and isosorbide mononitrate -Hold furosemide due to acute kidney  Acute kidney injury superimposed on chronic kidney disease stage IIIb: Patient presents with creatinine elevated to 2.14 with BUN 39.  Patient baseline creatinine appears to be 1.2-1.4.  Suspect likely prerenal in nature given history.  Patient was given at least 1 L normal saline IV fluids in the ED. -Continue with normal saline IV fluids at 75 mL/h x 1 L of fluid -Recheck creatinine in a.m.  Hematuria: Acute.  Patient with urinalysis significant for  hematuria without signs of bacteria noted on urinalysis.  Suspect multi-factorial in the setting of thrombocytopenia with nephrolithiasis and dehydration.  Patient was found to have pansensitive E. coli during last admission in April. -Check urine culture   ITP: Acute on chronic.  Platelet counts improved from 9 on 6/2 up to 46 today.  Patient missed her last 2 infusions immune globulin with at least 1 time being due to issues with transportation.  Last infusion of immune globulin was on 6/7.  -Appreciate Dr. Lindi Adie of oncology who plans to order  immunoglobulin infusion while here at the hospital  Diabetes mellitus type 2: Patient present with elevated blood glucose of 381 without signs of elevated anion gap.  Suspect this is secondary to acute stress as last hemoglobin A1c on 4/8 noted to be 5.5.  Home medications include Metformin 500 mg daily. -Hypoglycemic protocol -Hold Metformin -CBGs before every meal with sensitive SSI  Diastolic congestive heart failure: Patient appears to be hypovolemic at this time.  Last EF noted to be 65- 70% by echocardiogram on 4/7.  She had received a total of 1-2 L while in the emergency department.  -Intake and output -Daily weights -Consider restarting diuretics when appropriate  Hyperlipidemia: Home medications include Crestor 20 mg daily. -Continue statin  s/p BKA  Nephrolithiasis: Patient incidentally found to have 3 mm nonobstructing stone on the right by CT.  DVT prophylaxis: SCDs Code Status: Full Family Communication: Patient's mother updated over the family  Disposition Plan: Possibly discharge home in 2 to 3 days due to need of IV fluids Consults called: None Admission status: Inpatient  Norval Morton MD [facility] Pager [phone]   If 7PM-7AM, please contact night-coverage [email or web address] Password [facility]  06/26/2019, 7:03 AM

## 2019-06-26 NOTE — ED Provider Notes (Addendum)
[facility] EMERGENCY DEPARTMENT Provider Note   CSN: [id number] Arrival date & time: 06/25/19  1610     History Chief Complaint  Patient presents with  . Weakness    [name] is a 54 y.o. female.  HPI     This a 54 year old female with a history of CHF with a recent echo with a normal EF, diabetes, hypertension, intracranial hemorrhage, leukopenia and thrombocytopenia who presents with generalized weakness, nausea, vomiting, abdominal pain.  Patient reports 2-day history of "I cannot eat or keep anything down."  She reports diffuse abdominal pain that is nonradiating.  She is not taking anything for symptoms.  She also states she feels generally weak and "I feel like my platelets are low."  She reports that she has noted some slight blood in her stool and irritation with urination which are common when her platelets are low.  She has had to have platelet transfusions as often as every few weeks and has not had one in several weeks.  Denies any recent fevers, cough, chest pain, shortness of breath.  Denies any bloody emesis.  Denies any significant abdominal pain at this time.  Chart reviewed.  It appears she has relapsing ITP.  She has previously been on Ribaxin, IVIG.  Missed her last infusion.  Past Medical History:  Diagnosis Date  . Acute exacerbation of CHF (congestive heart failure) ([place]) 04/11/2019  . Anemia   . Arthritis    "spine, hands" (11/25/2015)  . Back pain    "all my back; probably 3 times/week" (11/25/2015)  . Chronic indwelling Foley catheter 03/04/2017  . Diabetic foot ulcer associated with type 2 diabetes mellitus ([place])   . Hypertension   . Intracerebral hemorrhage ([place]) As a teenager    States she had burst blood vessel as teenager, now with resultant minor visual field and hearing deficits  . Neuropathy   . Stroke [facility]) ~ 1982   "my feeling on my RLE, right lower eye vision,  & hearing out of my right ear not the same since"  (11/25/2015)  . Type II diabetes mellitus [facility])     Patient Active Problem List   Diagnosis Date Noted  . Nausea and vomiting 06/26/2019  . Hematuria 06/26/2019  . Hypertensive urgency 06/26/2019  . Acute diastolic CHF (congestive heart failure) ([place])   . CHF (congestive heart failure) ([place]) 04/11/2019  . Acute exacerbation of CHF (congestive heart failure) ([place]) 04/11/2019  . Leukopenia 04/11/2019  . History of ITP 04/11/2019  . Idiopathic thrombocytopenic purpura (ITP) (HCC) 11/16/2018  . Onychomycosis of toenail 10/16/2018  . Cataract of both eyes 10/16/2018  . Tinea pedis of left foot 10/16/2018  . Decreased vision in both eyes 10/16/2018  . CKD (chronic kidney disease) stage 3, GFR 30-59 ml/min   . Cellulitis of left lower extremity 01/25/2018  . Hyperkalemia 01/25/2018  . Poor compliance 03/08/2017  . Hypoxia 03/07/2017  . Thyroid nodule 03/07/2017  . Chronic indwelling Foley catheter 03/04/2017  . Type II diabetes mellitus ([place])   . Femur fracture ([place]) 03/02/2017  . Orthostatic dizziness   . Urinary tract infection without hematuria   . Enterococcus faecalis infection   . Near syncope 11/25/2015  . Dehydration 11/25/2015  . Orthostatic hypotension 11/25/2015  . UTI (urinary tract infection) 11/25/2015  . Acute worsening of stage 3 chronic kidney disease 09/24/2015  . Diabetes mellitus due to underlying condition with chronic kidney disease, without long-term current use of insulin ([place])   . Fever   .  Status post below knee amputation of right lower extremity ([place])   . Diabetic peripheral neuropathy ([place])   . Neuropathic pain   . Benign essential HTN   . Folliculitis   . Slow transit constipation   . Anemia of chronic disease   . Bilateral hydronephrosis   . Urinary retention   . CKD (chronic kidney disease)   . Uncontrolled type 2 diabetes mellitus with diabetic nephropathy, without long-term current use of insulin ([place])   . Vasculopathy   . Debility  09/04/2015  . DM type 2 with diabetic peripheral neuropathy ([place])   . S/P BKA (below knee amputation) unilateral ([place])   . Medical non-compliance   . Benign skin lesion of multiple sites   . Tachypnea   . Acute blood loss anemia   . Neurogenic bladder   . Occult blood positive stool   . Rectovaginal fistula   . Controlled diabetes mellitus type 2 with complications ([place])   . Acute on chronic renal failure ([place])   . Hydronephrosis determined by ultrasound   . Papular rash   . Uncontrolled type 2 diabetes mellitus with complication ([place])   . Paresthesia   . Thrombocytopenia ([place]) 08/23/2015  . Pressure ulcer 08/23/2015  . Severe anemia 08/23/2015  . Lower urinary tract infectious disease 08/23/2015  . Absolute anemia 08/23/2015  . Peripheral vascular disease, unspecified ([place]) 04/27/2012  . Unilateral complete BKA ([place]) 07/09/2011  . Gas gangrene ([place]) 07/02/2011  . Sepsis(995.91) 07/02/2011  . Acute kidney injury superimposed on chronic kidney disease ([place]) 07/02/2011  . Diabetes mellitus ([place]) 11/22/2010  . Diabetic foot ulcer associated with type 2 diabetes mellitus ([place])   . Essential hypertension   . Arthritis   . Neuropathy ([place])   . Intracerebral hemorrhage [facility])     Past Surgical History:  Procedure Laterality Date  . AMPUTATION  11/24/2010   Procedure: AMPUTATION FOOT;  Surgeon: Wylene Simmer, MD;  Location: [facility];  Service: Orthopedics;  Laterality: Right;  Right  FOOT TRANS METATARSAL AMPUTATION  . AMPUTATION  07/02/2011   Procedure: AMPUTATION BELOW KNEE;  Surgeon: Wylene Simmer, MD;  Location: [facility];  Service: Orthopedics;  Laterality: Right;  . APPLICATION OF WOUND VAC  07/02/2011   Procedure: APPLICATION OF WOUND VAC;  Surgeon: Wylene Simmer, MD;  Location: [place];  Service: Orthopedics;  Laterality: Right;  . BRAIN SURGERY  1980's   Previous brain surgery in [place], [place]  . CESAREAN SECTION  2004; 2006  . COLONOSCOPY N/A 09/17/2015   Procedure: COLONOSCOPY;  Surgeon:  Wilford Corner, MD;  [facility] ENDOSCOPY;  Service: Endoscopy;  Laterality: N/A;  . ESOPHAGOGASTRODUODENOSCOPY N/A 09/17/2015   Procedure: ESOPHAGOGASTRODUODENOSCOPY (EGD);  Surgeon: Wilford Corner, MD;  Location: [facility] ENDOSCOPY;  Service: Endoscopy;  Laterality: N/A;  . I & D EXTREMITY  11/24/2010   Procedure: IRRIGATION AND DEBRIDEMENT EXTREMITY;  Surgeon: Wylene Simmer, MD;  Location: [facility];  Service: Orthopedics;  Laterality: Left;  Debriedment of left plantar ulcer and trimming of toenails  . I & D EXTREMITY  07/02/2011   Procedure: IRRIGATION AND DEBRIDEMENT EXTREMITY;  Surgeon: Wylene Simmer, MD;  Location: [facility];  Service: Orthopedics;  Laterality: Left;  I & D of Left Foot  . I & D EXTREMITY  07/06/2011   Procedure: IRRIGATION AND DEBRIDEMENT EXTREMITY;  Surgeon: Wylene Simmer, MD;  Location: [facility];  Service: Orthopedics;  Laterality: Right;  IRRIGATION/DEBRIDEMENT RIGHT BELOW KNEE AMPUTATION  . TUBAL LIGATION  2006     OB History  No obstetric history on file.     Family History  Problem Relation Age of Onset  . Diabetes Mother   . Hypertension Mother   . Hyperlipidemia Mother   . Diabetes Father   . Cancer Father        prostate  . Hyperlipidemia Father   . Hypertension Father   . Diabetes Brother   . Peripheral Artery Disease Brother        has had toes amputated  . Diabetes Son     Social History   Tobacco Use  . Smoking status: Never Smoker  . Smokeless tobacco: Never Used  Vaping Use  . Vaping Use: Never used  Substance Use Topics  . Alcohol use: Yes    Alcohol/week: 2.0 standard drinks    Types: 2 Glasses of wine per week  . Drug use: No    Home Medications Prior to Admission medications   Medication Sig Start Date End Date Taking? Authorizing Provider  acetaminophen (TYLENOL) 500 MG tablet Take 1,000 mg by mouth every 6 (six) hours as needed for mild pain.   Yes [provider]  furosemide (LASIX) 40 MG tablet Take 1 tablet (40 mg total) by  mouth 2 (two) times daily. 04/19/19  Yes Florencia Reasons, MD  gabapentin (NEURONTIN) 400 MG capsule Take 1 capsule (400 mg total) by mouth 2 (two) times daily. 04/19/19  Yes Florencia Reasons, MD  isosorbide mononitrate (IMDUR) 30 MG 24 hr tablet Take 1 tablet (30 mg total) by mouth daily. 06/18/19 12/15/19 Yes Vevelyn Francois, NP  metFORMIN (GLUCOPHAGE) 500 MG tablet Take 1 tablet (500 mg total) by mouth 2 (two) times daily with a meal. Patient taking differently: Take 500 mg by mouth daily with breakfast.  11/13/18 06/26/19 Yes Nita Sells, MD  metoprolol succinate (TOPROL-XL) 50 MG 24 hr tablet Take 1 tablet (50 mg total) by mouth daily. Take with or immediately following a meal. 06/18/19 12/15/19 Yes King, Diona Foley, NP  rosuvastatin (CRESTOR) 20 MG tablet Take 1 tablet (20 mg total) by mouth daily. 06/18/19 12/15/19 Yes Vevelyn Francois, NP  Blood Glucose Monitoring Suppl (PRODIGY AUTOCODE BLOOD GLUCOSE) w/Device KIT 1 kit by Does not apply route in the morning, at noon, in the evening, and at bedtime. 06/06/19 06/05/20  Vevelyn Francois, NP  ondansetron (ZOFRAN ODT) 4 MG disintegrating tablet Take 1 tablet (4 mg total) by mouth every 8 (eight) hours as needed for nausea or vomiting. 06/26/19   Mable Dara, Barbette Hair, MD    Allergies    Patient has no known allergies.  Review of Systems   Review of Systems  Constitutional: Negative for fever.  Respiratory: Negative for shortness of breath.   Cardiovascular: Negative for chest pain.  Gastrointestinal: Positive for abdominal pain, nausea and vomiting. Negative for constipation and diarrhea.  Genitourinary: Negative for dysuria.  All other systems reviewed and are negative.   Physical Exam Updated Vital Signs BP (!) 181/86 (BP Location: Right Arm)   Pulse 89   Temp 98.6 F (37 C) (Oral)   Resp 16   Ht 1.676 m ('5\' 6"' )   Wt 96.7 kg   LMP  (LMP Unknown)   SpO2 97%   BMI 34.41 kg/m   Physical Exam Vitals and nursing note reviewed.  Constitutional:       Appearance: She is well-developed.     Comments: Chronically ill-appearing, obese, nontoxic  HENT:     Head: Normocephalic and atraumatic.     Nose: Nose normal.  Mouth/Throat:     Mouth: Mucous membranes are dry.  Eyes:     Pupils: Pupils are equal, round, and reactive to light.  Cardiovascular:     Rate and Rhythm: Regular rhythm. Tachycardia present.     Heart sounds: Normal heart sounds.  Pulmonary:     Effort: Pulmonary effort is normal. No respiratory distress.     Breath sounds: No wheezing.  Abdominal:     General: Bowel sounds are normal.     Palpations: Abdomen is soft.     Tenderness: There is no abdominal tenderness. There is no guarding or rebound.  Musculoskeletal:     Cervical back: Neck supple.     Comments: Right BKA  Skin:    General: Skin is warm and dry.     Comments: Bruising noted mostly of the right upper extremity  Neurological:     Mental Status: She is alert and oriented to person, place, and time.  Psychiatric:        Mood and Affect: Mood normal.     ED Results / Procedures / Treatments   Labs (all labs ordered are listed, but only abnormal results are displayed) Labs Reviewed  COMPREHENSIVE METABOLIC PANEL - Abnormal; Notable for the following components:      Result Value   Glucose, Bld 381 (*)    BUN 39 (*)    Creatinine, Ser 2.14 (*)    Total Protein 9.0 (*)    GFR calc non Af Amer 26 (*)    GFR calc Af Amer 30 (*)    All other components within normal limits  CBC - Abnormal; Notable for the following components:   RBC 5.42 (*)    HCT 46.3 (*)    RDW 19.9 (*)    Platelets 46 (*)    All other components within normal limits  URINALYSIS, ROUTINE W REFLEX MICROSCOPIC - Abnormal; Notable for the following components:   APPearance HAZY (*)    Glucose, UA >=500 (*)    Hgb urine dipstick LARGE (*)    Ketones, ur 5 (*)    Protein, ur >=300 (*)    RBC / HPF >50 (*)    All other components within normal limits  GLUCOSE, CAPILLARY -  Abnormal; Notable for the following components:   Glucose-Capillary 198 (*)    All other components within normal limits  CBG MONITORING, ED - Abnormal; Notable for the following components:   Glucose-Capillary 329 (*)    All other components within normal limits  CBG MONITORING, ED - Abnormal; Notable for the following components:   Glucose-Capillary 251 (*)    All other components within normal limits  CBG MONITORING, ED - Abnormal; Notable for the following components:   Glucose-Capillary 192 (*)    All other components within normal limits  SARS CORONAVIRUS 2 BY RT PCR (HOSPITAL ORDER, [facility])  URINE CULTURE  LIPASE, BLOOD  CBC  BASIC METABOLIC PANEL  TYPE AND SCREEN    EKG EKG Interpretation  Date/Time:  Tuesday June 26 2019 00:23:15 EDT Ventricular Rate:  108 PR Interval:    QRS Duration: 71 QT Interval:  344 QTC Calculation: 462 R Axis:   19 Text Interpretation: Sinus tachycardia Biatrial enlargement Borderline repolarization abnormality Confirmed by Thayer Jew [phone]) on 06/26/2019 2:31:00 AM   Radiology CT ABDOMEN PELVIS WO CONTRAST  Result Date: 06/26/2019 CLINICAL DATA:  Nausea and vomiting EXAM: CT ABDOMEN AND PELVIS WITHOUT CONTRAST TECHNIQUE: Multidetector CT imaging of the abdomen  and pelvis was performed following the standard protocol without IV contrast. COMPARISON:  Abdominal radiograph 06/26/2019 08/29/2015 CT abdomen pelvis FINDINGS: LOWER CHEST: Normal. HEPATOBILIARY: Normal hepatic contours. No intra- or extrahepatic biliary dilatation. The gallbladder is normal. PANCREAS: Normal pancreas. No ductal dilatation or peripancreatic fluid collection. SPLEEN: Normal. ADRENALS/URINARY TRACT: The adrenal glands are normal. 3 mm right renal interpolar calculus. No hydronephrosis. The urinary bladder is normal for degree of distention STOMACH/BOWEL: There is no hiatal hernia. Normal duodenal course and caliber. No small bowel  dilatation or inflammation. No focal colonic abnormality. Normal appendix. VASCULAR/LYMPHATIC: Normal course and caliber of the major abdominal vessels. No abdominal or pelvic lymphadenopathy. REPRODUCTIVE: Normal uterus.  No adnexal mass. MUSCULOSKELETAL. No bony spinal canal stenosis or focal osseous abnormality. OTHER: None. IMPRESSION: 1. No acute abnormality of the abdomen or pelvis. 2. 3 mm nonobstructive right renal interpolar calculus. Electronically Signed   By: Ulyses Jarred M.D.   On: 06/26/2019 03:51   DG Abdomen Acute W/Chest  Result Date: 06/26/2019 CLINICAL DATA:  Vomiting and abdominal pain. EXAM: DG ABDOMEN ACUTE W/ 1V CHEST COMPARISON:  Chest radiograph 05/16/2019 FINDINGS: Stable cardiomegaly. No acute airspace disease. No pleural fluid. No free intra-abdominal air. No bowel dilatation to suggest obstruction. There are no radiopaque calculi or abnormal soft tissue calcifications. Vascular calcifications. Small to moderate volume of colonic stool. Mild degenerative change of the hips. IMPRESSION: 1. Normal bowel gas pattern. No free air. 2. Stable cardiomegaly. Electronically Signed   By: Keith Rake M.D.   On: 06/26/2019 02:28    Procedures Ultrasound ED Peripheral IV (Provider)  Date/Time: 06/26/2019 2:32 AM Performed by: Merryl Hacker, MD Authorized by: Merryl Hacker, MD   Procedure details:    Indications: multiple failed IV attempts and poor IV access     Skin Prep: chlorhexidine gluconate     Location:  Right AC   Angiocath:  20 G   Bedside Ultrasound Guided: Yes     Images: not archived     Patient tolerated procedure without complications: Yes     Dressing applied: Yes     (including critical care time)  Medications Ordered in ED Medications  metoprolol succinate (TOPROL-XL) 24 hr tablet 50 mg (50 mg Oral Given 06/26/19 1011)  rosuvastatin (CRESTOR) tablet 20 mg (20 mg Oral Given 06/26/19 1011)  isosorbide mononitrate (IMDUR) 24 hr tablet 30 mg (30  mg Oral Given 06/26/19 1011)  gabapentin (NEURONTIN) capsule 300 mg (300 mg Oral Given 06/26/19 2200)  sodium chloride flush (NS) 0.9 % injection 3 mL (3 mLs Intravenous Not Given 06/26/19 2201)  acetaminophen (TYLENOL) tablet 650 mg (650 mg Oral Given 06/26/19 2201)    Or  acetaminophen (TYLENOL) suppository 650 mg ( Rectal See Alternative 06/26/19 2201)  ondansetron (ZOFRAN) tablet 4 mg (4 mg Oral Given 06/26/19 2201)    Or  ondansetron (ZOFRAN) injection 4 mg ( Intravenous See Alternative 06/26/19 2201)  albuterol (PROVENTIL) (2.5 MG/3ML) 0.083% nebulizer solution 2.5 mg (has no administration in time range)  hydrALAZINE (APRESOLINE) injection 10 mg (has no administration in time range)  insulin aspart (novoLOG) injection 0-9 Units (2 Units Subcutaneous Given 06/26/19 1744)  pantoprazole (PROTONIX) injection 40 mg (40 mg Intravenous Given 06/26/19 2201)  Immune Globulin 10% (PRIVIGEN) IV infusion 95 g (has no administration in time range)  diphenhydrAMINE (BENADRYL) capsule 25 mg (has no administration in time range)  acetaminophen (TYLENOL) tablet 650 mg (has no administration in time range)  sodium chloride flush (NS) 0.9 % injection  3 mL (3 mLs Intravenous Given by Other 06/26/19 0100)  ondansetron (ZOFRAN-ODT) disintegrating tablet 4 mg (4 mg Oral Given 06/25/19 1617)  ondansetron (ZOFRAN) injection 4 mg (4 mg Intravenous Given 06/26/19 0121)  sodium chloride 0.9 % bolus 1,000 mL (0 mLs Intravenous Stopped 06/26/19 0252)  hydrALAZINE (APRESOLINE) injection 10 mg (10 mg Intravenous Given 06/26/19 0239)  ondansetron (ZOFRAN) injection 4 mg (4 mg Intravenous Given 06/26/19 0345)  0.9 %  sodium chloride infusion ( Intravenous New Bag/Given 06/26/19 3729)    ED Course  I have reviewed the triage vital signs and the nursing notes.  Pertinent labs & imaging results that were available during my care of the patient were reviewed by me and considered in my medical decision making (see chart for  details).  Clinical Course as of Jun 26 425  Tue Jun 26, 2019  0405 Reviewed work-up with patient.  Patient's blood pressure has improved with hydralazine.  She does clinically appear dry and has an AKI.  She was given fluids and Zofran.  CT abdomen shows no acute abnormality.  Patient feels reassured.  Platelets are much improved but not within normal range yet.  Will attempt to p.o. challenge to ensure that the patient can stay hydrated and attempt discharge home.  Patient is agreeable to plan.   [CH]  0542 Patient very tearful at discharge.  States that she does not feel well and now feels like she will not be able to take her medications at home.  She does have some evidence of dehydration.  Vomiting likely related to viral etiology.  Blood pressure now increased again to 204/99.  Will consult the hospitalist for admission.   [CH]    Clinical Course User Index [CH] Pearlie Nies, Barbette Hair, MD   MDM Rules/Calculators/A&P                          Patient presents with generalized weakness.  She is also concerned for thrombocytopenia and has had some nausea and vomiting.  She is chronically ill-appearing but nontoxic.  Vital signs notable for hypertension.  She has not been able to keep down her blood pressure medications.  Patient was given nausea medication and fluids.  Last EF based on chart review was greater than 65%.  Lab work initiated.  BMP notable for creatinine of 2.14.  This is increased from a baseline around 1.5.  She also has some hyperglycemia and appears hemoconcentrated which is likely related to dehydration.  No obvious urinary tract infection.  EKG shows no evidence of acute ischemia or arrhythmia.  Acute abdominal series does not show any evidence of obstruction.  Patient continued to endorse nausea although she did not vomit.  Given her age and evidence of dehydration, will obtain CT scan.  CT scan shows no significant intra-abdominal pathology.  Suspect gastritis versus  gastroenteritis.  Patient was given fluids.  She is able to orally hydrate.  Blood pressure did improve with hydralazine.  Suspect her blood pressure likely related to inability to tolerate her medications.  Doubt hypertensive urgency or emergency.  Discussed plan with patient.  Feel she is reasonable for trial of discharge with Zofran for nausea as long as she can stay hydrated.  She was able to tolerate fluids prior to discharge.  After history, exam, and medical workup I feel the patient has been appropriately medically screened and is safe for discharge home. Pertinent diagnoses were discussed with the patient. Patient was given return  precautions.   Final Clinical Impression(s) / ED Diagnoses Final diagnoses:  Dehydration  AKI (acute kidney injury) ([place])  Non-intractable vomiting with nausea, unspecified vomiting type  Thrombocytopenia ([place])  Essential hypertension    Rx / DC Orders ED Discharge Orders         Ordered    ondansetron (ZOFRAN ODT) 4 MG disintegrating tablet  Every 8 hours PRN     Discontinue  Reprint     06/26/19 0504           Ashleynicole Mcclees, Barbette Hair, MD 06/26/19 4142    Merryl Hacker, MD 06/27/19 [phone]

## 2019-06-26 NOTE — ED Notes (Signed)
Ordered clear liquid diet for pt.  

## 2019-06-26 NOTE — ED Notes (Signed)
MD Horton made aware of B/P 207/113

## 2019-06-26 NOTE — ED Notes (Signed)
Patient states she is still not feeling well to go home, MD notified.  Dr Wilkie Aye at bedside.

## 2019-06-27 ENCOUNTER — Encounter (HOSPITAL_COMMUNITY): Payer: Self-pay | Admitting: Internal Medicine

## 2019-06-27 ENCOUNTER — Other Ambulatory Visit: Payer: Self-pay

## 2019-06-27 LAB — BASIC METABOLIC PANEL
Anion gap: 14 (ref 5–15)
BUN: 47 mg/dL — ABNORMAL HIGH (ref 6–20)
CO2: 20 mmol/L — ABNORMAL LOW (ref 22–32)
Calcium: 9.1 mg/dL (ref 8.9–10.3)
Chloride: 106 mmol/L (ref 98–111)
Creatinine, Ser: 2.09 mg/dL — ABNORMAL HIGH (ref 0.44–1.00)
GFR calc Af Amer: 31 mL/min — ABNORMAL LOW (ref >60)
GFR calc non Af Amer: 27 mL/min — ABNORMAL LOW (ref >60)
Glucose, Bld: 183 mg/dL — ABNORMAL HIGH (ref 70–99)
Potassium: 4.1 mmol/L (ref 3.5–5.1)
Sodium: 140 mmol/L (ref 135–145)

## 2019-06-27 LAB — GLUCOSE, CAPILLARY
Glucose-Capillary: 172 mg/dL — ABNORMAL HIGH (ref 70–99)
Glucose-Capillary: 177 mg/dL — ABNORMAL HIGH (ref 70–99)
Glucose-Capillary: 175 mg/dL — ABNORMAL HIGH (ref 70–99)
Glucose-Capillary: 180 mg/dL — ABNORMAL HIGH (ref 70–99)

## 2019-06-27 LAB — CBC
HCT: 43.6 % (ref 36.0–46.0)
Hemoglobin: 13.3 g/dL (ref 12.0–15.0)
MCH: 26.9 pg (ref 26.0–34.0)
MCHC: 30.5 g/dL (ref 30.0–36.0)
MCV: 88.1 fL (ref 80.0–100.0)
Platelets: 45 K/uL — ABNORMAL LOW (ref 150–400)
RBC: 4.95 MIL/uL (ref 3.87–5.11)
RDW: 20.2 % — ABNORMAL HIGH (ref 11.5–15.5)
WBC: 6.4 K/uL (ref 4.0–10.5)
nRBC: 0 % (ref 0.0–0.2)

## 2019-06-27 LAB — URINE CULTURE: Culture: NO GROWTH

## 2019-06-27 MED ORDER — ENSURE MAX PROTEIN PO LIQD
11.0000 [oz_av] | Freq: Two times a day (BID) | ORAL | Status: DC
  Administered 2019-06-27 – 2019-06-28 (×2): 11 [oz_av] via ORAL
  Administered 2019-06-28: 237 mL via ORAL
  Administered 2019-06-29: 11 [oz_av] via ORAL
  Filled 2019-06-27 (×5): qty 330

## 2019-06-27 MED ORDER — SODIUM CHLORIDE 0.9 % IV SOLN: INTRAVENOUS | Status: DC

## 2019-06-27 NOTE — Progress Notes (Signed)
Initial Nutrition Assessment  DOCUMENTATION CODES:   Obesity unspecified  INTERVENTION:  Ensure Max po BID, each supplement provides 150 kcal and 30 grams of protein  Magic cup TID with meals, each supplement provides 290 kcal and 9 grams of protein  NUTRITION DIAGNOSIS:   Inadequate oral intake related to poor appetite as evidenced by per patient/family report.    GOAL:   Patient will meet greater than or equal to 90% of their needs    MONITOR:   PO intake, Supplement acceptance, Labs, Weight trends, I & O's, Diet advancement  REASON FOR ASSESSMENT:   Malnutrition Screening Tool    ASSESSMENT:   Pt presented with N/V and abdominal pain concerning for gastritis. PMH includes HTN, T2DM, ITP, anemia, nephrolithiasis, and s/p R BKA.  Pt states she has had a poor appetite since Saturday/Sunday. Prior to that, she endorses eating snacks throughout the day and 1 larger meal.   Pt unsure of her usual body weight and unsure if her weight has changed in the last year. Per wt readings, pt with potential 10% wt loss x1 month, though unable to verify accuracy of weights.    PO Intake: 30-50% x2 recorded meals  Labs: CBGs [id number] Medications: Novolog, Protonix  NUTRITION - FOCUSED PHYSICAL EXAM:      Most Recent Value  Orbital Region No depletion  Upper Arm Region No depletion  Thoracic and Lumbar Region No depletion  Buccal Region No depletion  Temple Region No depletion  Clavicle Bone Region No depletion  Clavicle and Acromion Bone Region No depletion  Scapular Bone Region No depletion  Dorsal Hand No depletion  Patellar Region No depletion  Anterior Thigh Region No depletion  Posterior Calf Region No depletion  Edema (RD Assessment) None  Hair Reviewed  Eyes Reviewed  Mouth Reviewed  Skin Reviewed  Nails Reviewed       Diet Order:   Diet Order            Diet full liquid Room service appropriate? Yes; Fluid consistency: Thin  Diet effective now                  EDUCATION NEEDS:   No education needs have been identified at this time  Skin:  Skin Assessment: Reviewed RN Assessment  Last BM:  unknown  Height:   Ht Readings from Last 1 Encounters:  06/27/19 5\' 6"  (1.676 m)    Weight:   Wt Readings from Last 10 Encounters:  06/26/19 95.4 kg  06/06/19 96.7 kg  05/21/19 106.6 kg  04/30/19 106.6 kg  04/19/19 106.6 kg  11/13/18 103.5 kg  10/16/18 94.3 kg  09/13/18 108 kg  03/06/18 107.4 kg  02/21/18 107.4 kg    BMI:  Body mass index is 33.95 kg/m.  Estimated Nutritional Needs:   Kcal:  1900-2100  Protein:  95-110 grams  Fluid:  >1.9L    02/23/18, MS, RD, LDN RD pager number and weekend/on-call pager number located in [facility].

## 2019-06-27 NOTE — Progress Notes (Signed)
PROGRESS NOTE    [name]  BMW:[id number] DOB: [birth year] DOA: 06/25/2019 PCP: Serena Croissant, MD   Brief Narrative:  HPI: [name] is a 54 y.o. female with medical history significant of hypertension, DM type II, ITP, anemia, nephrolithiasis, and s/p right BKA presents with complaints of nausea and vomiting over the last 3 days.  Emesis is noted to be nonbloody in appearance.  Unable to keep any food or liquids down.  Associated symptoms include midline abdominal pain that does not radiate, constipation with last bowel movement prior to the onset of symptoms, generalized weakness, blood in stool, and dysuria.  Notes dysuria usually occurs when platelet counts are low.  She had been receiving infusions of immune globulin due to an acute flare of her ITP since april, but she had missed her last 2 appointments due to transportation issues and yesterday as she was here in the hospital.  Denies having any fevers, cough, chest pain, shortness of breath  ED Course: Upon admission into the emergency department patient was seen to be afebrile with pulse 102-115, respiration 14-27, blood pressure elevated up to 222/105, and O2 saturations maintained on room air.  Labs significant for platelets 46, BUN 39, creatinine 2.14(baseline Cr around 1.21), and glucose 381.  LFTs and lipase are within normal limits.  Abdominal series x-ray noted stable cardiomegaly without signs of obstruction.  Urinalysis significant for large hemoglobin, greater than 50 RBCs/hpf, and 21-50 WBCs.  CT of the abdomen and pelvis did not note any acute abnormalities and a 3 mm nonobstructing right renal calculus.  Patient was given 1 L normal saline IV fluids, antiemetics, hydralazine 10 mg IV, and metoprolol 50 mg p.o. COVID-19 screening negative.  Assessment & Plan:   Active Problems:   Diabetes mellitus (HCC)   Acute kidney injury superimposed on chronic kidney disease (HCC)   S/P BKA (below knee amputation)  unilateral (HCC)   Dehydration   Idiopathic thrombocytopenic purpura (ITP) (HCC)   Nausea and vomiting   Hematuria   Hypertensive urgency   Nausea and vomiting, Abdominal pain: Acute.  She presents with complaints of midline abdominal pain, nausea and vomiting.  Emesis noted to be nonbloody in appearance.  CT scan of the abdomen and pelvis negative for any clear cause of symptoms.  Question possibility of gastritis as cause of symptoms.  Feels a little better.  Some nausea but no vomiting.  Tolerating clear liquid diet.  Advance to full liquid.  Continue IV fluids.  Hypertensive urgency: Acute.  On admission blood pressures elevated up to 222/105. Home blood pressure medications include metoprolol 50 mg daily, furosemide 40 mg twice daily, and isosorbide mononitrate 30 mg daily. -Continue metoprolol and isosorbide mononitrate -Continue to hold furosemide due to acute kidney. Blood pressure much better today.  Acute kidney injury superimposed on chronic kidney disease stage IIIb: Patient presents with creatinine elevated to 2.14 with BUN 39.  Patient baseline creatinine appears to be 1.4.  Suspect likely prerenal in nature given history.  Patient was given at least 1 L normal saline IV fluids in the ED. she is not on any IV fluids.  Renal function has improved slightly.  Will resume back on normal saline.  Recheck labs in the morning.  Hematuria: Acute.  Patient with urinalysis significant for hematuria without signs of bacteria noted on urinalysis.  Suspect multi-factorial in the setting of thrombocytopenia with nephrolithiasis and dehydration.  Patient was found to have pansensitive E. coli during last admission in April.  ITP:  Acute on chronic.  Platelet counts improved from 9 on 6/2 up to 46 at the time of admission. Patient missed her last 2 infusions immune globulin with at least 1 time being due to issues with transportation.  Last infusion of immune globulin was on 6/7.  -Appreciate  Dr. Pamelia Hoit of oncology who plans to order immunoglobulin infusion while here at the hospital.  Diabetes mellitus type 2: Patient present with elevated blood glucose of 381 without signs of elevated anion gap.  Suspect this is secondary to acute stress as last hemoglobin A1c on 4/8 noted to be 5.5.  Home medications include Metformin 500 mg daily. Blood sugar only slightly elevated which is acceptable.  Continue SSI.  Diastolic congestive heart failure: Patient appears to be hypovolemic at this time.  Last EF noted to be 65- 70% by echocardiogram on 4/7.  She had received a total of 1-2 L.  Will resume diuretics when appropriate.  Hyperlipidemia: Home medications include Crestor 20 mg daily. -Continue statin  s/p right BKA  Nephrolithiasis: Patient incidentally found to have 3 mm nonobstructing stone on the right by CT. expect this to pass without needing any intervention.  DVT prophylaxis: SCDs Start: 06/26/19 0731   Code Status: Full Code  Family Communication:  None present at bedside.  Plan of care discussed with patient in length and he verbalized understanding and agreed with it.  Status is: Inpatient  Remains inpatient appropriate because:IV treatments appropriate due to intensity of illness or inability to take PO   Dispo: The patient is from: Home              Anticipated d/c is to: Home              Anticipated d/c date is: 1 day              Patient currently is not medically stable to d/c.        Estimated body mass index is 33.95 kg/m as calculated from the following:   Height as of this encounter: 5\' 6"  (1.676 m).   Weight as of this encounter: 95.4 kg.      Nutritional status:               Consultants:   None  Procedures:   None  Antimicrobials:  Anti-infectives (From admission, onward)   None         Subjective: Seen and examined.  No new complaint.  Some nausea but no vomiting.  No abdominal pain.  Objective: Vitals:    06/27/19 1152 06/27/19 1207 06/27/19 1215 06/27/19 1238  BP: (!) 172/85 (!) 172/85 (!) 169/83 (!) 155/94  Pulse: 77 76 77 79  Resp: 16 16 19 16   Temp: 98.7 F (37.1 C) 98.7 F (37.1 C)  98 F (36.7 C)  TempSrc: Oral Oral  Oral  SpO2: 97% 96% 99% 97%  Weight:      Height:        Intake/Output Summary (Last 24 hours) at 06/27/2019 1334 Last data filed at 06/27/2019 0957 Gross per 24 hour  Intake 230 ml  Output 350 ml  Net -120 ml   Filed Weights   06/26/19 1900 06/26/19 2046  Weight: 96.7 kg 95.4 kg    Examination:  General exam: Appears calm and comfortable  Respiratory system: Clear to auscultation. Respiratory effort normal. Cardiovascular system: S1 & S2 heard, RRR. No JVD, murmurs, rubs, gallops or clicks. No pedal edema. Gastrointestinal system: Abdomen is nondistended, soft and nontender. No  organomegaly or masses felt. Normal bowel sounds heard. Central nervous system: Alert and oriented. No focal neurological deficits. Extremities: Symmetric 5 x 5 power. Skin: No rashes, lesions or ulcers Psychiatry: Judgement and insight appear poor. Mood & affect flat.   Data Reviewed: I have personally reviewed following labs and imaging studies  CBC: Recent Labs  Lab 06/26/19 0124 06/27/19 0440  WBC 7.5 6.4  HGB 14.6 13.3  HCT 46.3* 43.6  MCV 85.4 88.1  PLT 46* 45*   Basic Metabolic Panel: Recent Labs  Lab 06/26/19 0124 06/27/19 0440  NA 135 140  K 4.3 4.1  CL 98 106  CO2 24 20*  GLUCOSE 381* 183*  BUN 39* 47*  CREATININE 2.14* 2.09*  CALCIUM 9.4 9.1   GFR: Estimated Creatinine Clearance: 36.6 mL/min (A) (by C-G formula based on SCr of 2.09 mg/dL (H)). Liver Function Tests: Recent Labs  Lab 06/26/19 0124  AST 30  ALT 21  ALKPHOS 92  BILITOT 1.0  PROT 9.0*  ALBUMIN 3.8   Recent Labs  Lab 06/26/19 0124  LIPASE 23   No results for input(s): AMMONIA in the last 168 hours. Coagulation Profile: No results for input(s): INR, PROTIME in the last  168 hours. Cardiac Enzymes: No results for input(s): CKTOTAL, CKMB, CKMBINDEX, TROPONINI in the last 168 hours. BNP (last 3 results) No results for input(s): PROBNP in the last 8760 hours. HbA1C: No results for input(s): HGBA1C in the last 72 hours. CBG: Recent Labs  Lab 06/26/19 1116 06/26/19 1724 06/26/19 2049 06/27/19 0646 06/27/19 1103  GLUCAP 251* 192* 198* 172* 177*   Lipid Profile: No results for input(s): CHOL, HDL, LDLCALC, TRIG, CHOLHDL, LDLDIRECT in the last 72 hours. Thyroid Function Tests: No results for input(s): TSH, T4TOTAL, FREET4, T3FREE, THYROIDAB in the last 72 hours. Anemia Panel: No results for input(s): VITAMINB12, FOLATE, FERRITIN, TIBC, IRON, RETICCTPCT in the last 72 hours. Sepsis Labs: No results for input(s): PROCALCITON, LATICACIDVEN in the last 168 hours.  Recent Results (from the past 240 hour(s))  Urine culture     Status: None   Collection Time: 06/26/19  2:52 AM   Specimen: Urine, Random  Result Value Ref Range Status   Specimen Description URINE, RANDOM  Final   Special Requests NONE  Final   Culture   Final    NO GROWTH Performed at [facility], [address], [place], [place] [address]    Report Status 06/27/2019 FINAL  Final  SARS Coronavirus 2 by RT PCR (hospital order, performed in [facility] hospital lab) Nasopharyngeal Nasopharyngeal Swab     Status: None   Collection Time: 06/26/19  5:46 AM   Specimen: Nasopharyngeal Swab  Result Value Ref Range Status   SARS Coronavirus 2 NEGATIVE NEGATIVE Final    Comment: (NOTE) SARS-CoV-2 target nucleic acids are NOT DETECTED.  The SARS-CoV-2 RNA is generally detectable in upper and lower respiratory specimens during the acute phase of infection. The lowest concentration of SARS-CoV-2 viral copies this assay can detect is 250 copies / mL. A negative result does not preclude SARS-CoV-2 infection and should not be used as the sole basis for treatment or other patient management  decisions.  A negative result may occur with improper specimen collection / handling, submission of specimen other than nasopharyngeal swab, presence of viral mutation(s) within the areas targeted by this assay, and inadequate number of viral copies (<250 copies / mL). A negative result must be combined with clinical observations, patient history, and epidemiological information.  Fact Sheet for Patients:   StrictlyIdeas.no  Fact Sheet for Healthcare Providers: BankingDealers.co.za  This test is not yet approved or  cleared by the [place] FDA and has been authorized for detection and/or diagnosis of SARS-CoV-2 by FDA under an Emergency Use Authorization (EUA).  This EUA will remain in effect (meaning this test can be used) for the duration of the COVID-19 declaration under Section 564(b)(1) of the Act, 21 U.S.C. section 360bbb-3(b)(1), unless the authorization is terminated or revoked sooner.  Performed at [facility], [place] [address] [place], [place] [address]       Radiology Studies: CT ABDOMEN PELVIS WO CONTRAST  Result Date: 06/26/2019 CLINICAL DATA:  Nausea and vomiting EXAM: CT ABDOMEN AND PELVIS WITHOUT CONTRAST TECHNIQUE: Multidetector CT imaging of the abdomen and pelvis was performed following the standard protocol without IV contrast. COMPARISON:  Abdominal radiograph 06/26/2019 08/29/2015 CT abdomen pelvis FINDINGS: LOWER CHEST: Normal. HEPATOBILIARY: Normal hepatic contours. No intra- or extrahepatic biliary dilatation. The gallbladder is normal. PANCREAS: Normal pancreas. No ductal dilatation or peripancreatic fluid collection. SPLEEN: Normal. ADRENALS/URINARY TRACT: The adrenal glands are normal. 3 mm right renal interpolar calculus. No hydronephrosis. The urinary bladder is normal for degree of distention STOMACH/BOWEL: There is no hiatal hernia. Normal duodenal course and caliber. No small bowel dilatation or  inflammation. No focal colonic abnormality. Normal appendix. VASCULAR/LYMPHATIC: Normal course and caliber of the major abdominal vessels. No abdominal or pelvic lymphadenopathy. REPRODUCTIVE: Normal uterus.  No adnexal mass. MUSCULOSKELETAL. No bony spinal canal stenosis or focal osseous abnormality. OTHER: None. IMPRESSION: 1. No acute abnormality of the abdomen or pelvis. 2. 3 mm nonobstructive right renal interpolar calculus. Electronically Signed   By: Ulyses Jarred M.D.   On: 06/26/2019 03:51   DG Abdomen Acute W/Chest  Result Date: 06/26/2019 CLINICAL DATA:  Vomiting and abdominal pain. EXAM: DG ABDOMEN ACUTE W/ 1V CHEST COMPARISON:  Chest radiograph 05/16/2019 FINDINGS: Stable cardiomegaly. No acute airspace disease. No pleural fluid. No free intra-abdominal air. No bowel dilatation to suggest obstruction. There are no radiopaque calculi or abnormal soft tissue calcifications. Vascular calcifications. Small to moderate volume of colonic stool. Mild degenerative change of the hips. IMPRESSION: 1. Normal bowel gas pattern. No free air. 2. Stable cardiomegaly. Electronically Signed   By: Keith Rake M.D.   On: 06/26/2019 02:28    Scheduled Meds: . gabapentin  300 mg Oral BID  . insulin aspart  0-9 Units Subcutaneous TID WC  . isosorbide mononitrate  30 mg Oral Daily  . metoprolol succinate  50 mg Oral Daily  . pantoprazole (PROTONIX) IV  40 mg Intravenous Q12H  . rosuvastatin  20 mg Oral Daily  . sodium chloride flush  3 mL Intravenous Q12H   Continuous Infusions: . sodium chloride       LOS: 1 day   Time spent: 35 minutes   Darliss Cheney, MD [facility]  06/27/2019, 1:34 PM   To contact the attending provider between 7A-7P or the covering provider during after hours 7P-7A, please log into the web site [email or web address].

## 2019-06-28 ENCOUNTER — Ambulatory Visit: Payer: Medicare Other | Admitting: Nurse Practitioner

## 2019-06-28 LAB — BASIC METABOLIC PANEL
Anion gap: 8 (ref 5–15)
BUN: 38 mg/dL — ABNORMAL HIGH (ref 6–20)
CO2: 22 mmol/L (ref 22–32)
Calcium: 8.5 mg/dL — ABNORMAL LOW (ref 8.9–10.3)
Chloride: 103 mmol/L (ref 98–111)
Creatinine, Ser: 1.41 mg/dL — ABNORMAL HIGH (ref 0.44–1.00)
GFR calc Af Amer: 50 mL/min — ABNORMAL LOW (ref >60)
GFR calc non Af Amer: 43 mL/min — ABNORMAL LOW (ref >60)
Glucose, Bld: 179 mg/dL — ABNORMAL HIGH (ref 70–99)
Potassium: 3.6 mmol/L (ref 3.5–5.1)
Sodium: 133 mmol/L — ABNORMAL LOW (ref 135–145)

## 2019-06-28 LAB — GLUCOSE, CAPILLARY
Glucose-Capillary: 163 mg/dL — ABNORMAL HIGH (ref 70–99)
Glucose-Capillary: 203 mg/dL — ABNORMAL HIGH (ref 70–99)
Glucose-Capillary: 190 mg/dL — ABNORMAL HIGH (ref 70–99)
Glucose-Capillary: 179 mg/dL — ABNORMAL HIGH (ref 70–99)

## 2019-06-28 MED ORDER — PANTOPRAZOLE SODIUM 40 MG PO TBEC
40.0000 mg | DELAYED_RELEASE_TABLET | Freq: Two times a day (BID) | ORAL | Status: DC
  Administered 2019-06-28 – 2019-06-29 (×3): 40 mg via ORAL
  Filled 2019-06-28 (×3): qty 1

## 2019-06-28 MED ORDER — AMLODIPINE BESYLATE 5 MG PO TABS
5.0000 mg | ORAL_TABLET | Freq: Every day | ORAL | Status: DC
  Administered 2019-06-28 – 2019-06-29 (×2): 5 mg via ORAL
  Filled 2019-06-28 (×2): qty 1

## 2019-06-28 NOTE — Progress Notes (Addendum)
PROGRESS NOTE    [name]  IOE:[id number] DOB: [birth year] DOA: 06/25/2019 PCP: Serena Croissant, MD   Brief Narrative:  [name] is a 54 y.o. female with medical history significant of hypertension, DM type II, ITP, anemia, nephrolithiasis, and s/p right BKA presented with complaints of nausea and vomiting over the last 3 days.  Unable to keep any food or liquids down.  Associated symptoms include midline abdominal pain that does not radiate, constipation with last bowel movement prior to the onset of symptoms, generalized weakness, blood in stool, and dysuria.  Notes dysuria usually occurs when platelet counts are low.  She had been receiving infusions of immune globulin due to an acute flare of her ITP since april, but she had missed her last 2 appointments due to transportation issues. Denied having any fevers, cough, chest pain, shortness of breath  Upon admission into the emergency department patient was seen to be afebrile with pulse 102-115, respiration 14-27, blood pressure elevated up to 222/105, and O2 saturations maintained on room air.  Labs significant for platelets 46, BUN 39, creatinine 2.14(baseline Cr around 1.21), and glucose 381.  LFTs and lipase are within normal limits. Abdominal series x-ray noted stable cardiomegaly without signs of obstruction.  Urinalysis significant for large hemoglobin, greater than 50 RBCs/hpf, and 21-50 WBCs.  CT of the abdomen and pelvis did not note any acute abnormalities and a 3 mm nonobstructing right renal calculus.  Patient was given 1 L normal saline IV fluids, antiemetics, hydralazine 10 mg IV, and metoprolol 50 mg p.o. COVID-19 screening negative.  She was admitted under hospitalist service with nausea and vomiting of unknown etiology.  Assessment & Plan:   Active Problems:   Diabetes mellitus (HCC)   Acute kidney injury superimposed on chronic kidney disease (HCC)   S/P BKA (below knee amputation) unilateral (HCC)    Dehydration   Idiopathic thrombocytopenic purpura (ITP) (HCC)   Nausea and vomiting   Hematuria   Hypertensive urgency   Nausea and vomiting, Abdominal pain: Acute.  She presents with complaints of midline abdominal pain, nausea and vomiting.  Emesis noted to be nonbloody in appearance.  CT scan of the abdomen and pelvis negative for any clear cause of symptoms.  Question possibility of gastritis as cause of symptoms.  Feels somewhat better.  Some nausea but no vomiting.  Will advance to soft diet today.  Hypertensive urgency: Acute.  On admission blood pressures elevated up to 222/105. Home blood pressure medications include metoprolol 50 mg daily, furosemide 40 mg twice daily, and isosorbide mononitrate 30 mg daily. -Blood pressure was much better yesterday but again elevated today.  Continue metoprolol and isosorbide mononitrate and add amlodipine 5 mg. -Continue to hold furosemide due to acute kidney injury.  Acute kidney injury superimposed on chronic kidney disease stage IIIb: Patient presents with creatinine elevated to 2.14 with BUN 39.  Patient baseline creatinine appears to be 1.4.  Suspect likely prerenal in nature given history.  Patient was given at least 1 L normal saline IV fluids in the ED. I started her on IV fluids yesterday.  Renal function is now back to her baseline and creatinine today is 1.4.  She is eating as well so I will reduce her IV fluid frequency to 75 cc/h.  Hematuria: Acute.  Patient with urinalysis significant for hematuria without signs of bacteria noted on urinalysis.  Suspect multi-factorial in the setting of thrombocytopenia with nephrolithiasis and dehydration.  Patient was found to have pansensitive E. coli  during last admission in April.  ITP: Acute on chronic.  Platelet counts improved from 9 on 6/2 up to 46 at the time of admission. Patient missed her last 2 infusions immune globulin with at least 1 time being due to issues with transportation.  Last  infusion of immune globulin was on 6/7.  -Appreciate Dr. Lindi Adie of oncology who ordered 1 dose of IVIG here.  Diabetes mellitus type 2: Patient present with elevated blood glucose of 381 without signs of elevated anion gap.  Suspect this is secondary to acute stress as last hemoglobin A1c on 4/8 noted to be 5.5.  Home medications include Metformin 500 mg daily. Blood sugar only slightly elevated and labile.  Continue SSI.  Diastolic congestive heart failure: Patient appears to be hypovolemic at this time.  Last EF noted to be 65- 70% by echocardiogram on 4/7.  She had received a total of 1-2 L.  Will resume diuretics when appropriate.  Hyperlipidemia: Home medications include Crestor 20 mg daily. -Continue statin  s/p right BKA  Nephrolithiasis: Patient incidentally found to have 3 mm nonobstructing stone on the right by CT. expect this to pass without needing any intervention.  DVT prophylaxis: SCDs Start: 06/26/19 0731, avoiding heparin products due to thrombocytopenia   Code Status: Full Code  Family Communication:  None present at bedside.  Plan of care discussed with patient in length and he verbalized understanding and agreed with it.  Status is: Inpatient  Remains inpatient appropriate because:IV treatments appropriate due to intensity of illness or inability to take PO   Dispo: The patient is from: Home              Anticipated d/c is to: Home              Anticipated d/c date is: 1 day              Patient currently is not medically stable to d/c.        Estimated body mass index is 33.95 kg/m as calculated from the following:   Height as of this encounter: 5\' 6"  (1.676 m).   Weight as of this encounter: 95.4 kg.      Nutritional status:  Nutrition Problem: Inadequate oral intake Etiology: poor appetite   Signs/Symptoms: per patient/family report   Interventions: Premier Protein, Magic cup    Consultants:   None  Procedures:    None  Antimicrobials:  Anti-infectives (From admission, onward)   None         Subjective: Seen and examined.  She states that she feels almost the same like yesterday with some nausea but no vomiting.  Tolerating full liquid diet.  No abdominal pain.  Objective: Vitals:   06/27/19 2117 06/28/19 0539 06/28/19 0652 06/28/19 0920  BP: (!) 187/85 (!) 208/86 (!) 158/69 (!) 169/80  Pulse: 84 85 85 79  Resp: 18   17  Temp: 98.3 F (36.8 C) 98.7 F (37.1 C)  98.9 F (37.2 C)  TempSrc: Oral Oral  Oral  SpO2: 95% 97%  96%  Weight: 95.4 kg     Height:        Intake/Output Summary (Last 24 hours) at 06/28/2019 1346 Last data filed at 06/28/2019 1100 Gross per 24 hour  Intake 1551.63 ml  Output 2500 ml  Net -948.37 ml   Filed Weights   06/26/19 1900 06/26/19 2046 06/27/19 2117  Weight: 96.7 kg 95.4 kg 95.4 kg    Examination:   General exam: Appears calm  and comfortable  Respiratory system: Clear to auscultation. Respiratory effort normal. Cardiovascular system: S1 & S2 heard, RRR. No JVD, murmurs, rubs, gallops or clicks. No pedal edema. Gastrointestinal system: Abdomen is nondistended, soft and nontender. No organomegaly or masses felt. Normal bowel sounds heard. Central nervous system: Alert and oriented. No focal neurological deficits. Extremities: Right BKA Skin: No rashes, lesions or ulcers.  Psychiatry: Judgement and insight appear normal. Mood & affect appropriate.    Data Reviewed: I have personally reviewed following labs and imaging studies  CBC: Recent Labs  Lab 06/26/19 0124 06/27/19 0440  WBC 7.5 6.4  HGB 14.6 13.3  HCT 46.3* 43.6  MCV 85.4 88.1  PLT 46* 45*   Basic Metabolic Panel: Recent Labs  Lab 06/26/19 0124 06/27/19 0440 06/28/19 0941  NA 135 140 133*  K 4.3 4.1 3.6  CL 98 106 103  CO2 24 20* 22  GLUCOSE 381* 183* 179*  BUN 39* 47* 38*  CREATININE 2.14* 2.09* 1.41*  CALCIUM 9.4 9.1 8.5*   GFR: Estimated Creatinine Clearance:  54.3 mL/min (A) (by C-G formula based on SCr of 1.41 mg/dL (H)). Liver Function Tests: Recent Labs  Lab 06/26/19 0124  AST 30  ALT 21  ALKPHOS 92  BILITOT 1.0  PROT 9.0*  ALBUMIN 3.8   Recent Labs  Lab 06/26/19 0124  LIPASE 23   No results for input(s): AMMONIA in the last 168 hours. Coagulation Profile: No results for input(s): INR, PROTIME in the last 168 hours. Cardiac Enzymes: No results for input(s): CKTOTAL, CKMB, CKMBINDEX, TROPONINI in the last 168 hours. BNP (last 3 results) No results for input(s): PROBNP in the last 8760 hours. HbA1C: No results for input(s): HGBA1C in the last 72 hours. CBG: Recent Labs  Lab 06/27/19 1103 06/27/19 1610 06/27/19 2116 06/28/19 0635 06/28/19 1120  GLUCAP 177* 175* 180* 163* 203*   Lipid Profile: No results for input(s): CHOL, HDL, LDLCALC, TRIG, CHOLHDL, LDLDIRECT in the last 72 hours. Thyroid Function Tests: No results for input(s): TSH, T4TOTAL, FREET4, T3FREE, THYROIDAB in the last 72 hours. Anemia Panel: No results for input(s): VITAMINB12, FOLATE, FERRITIN, TIBC, IRON, RETICCTPCT in the last 72 hours. Sepsis Labs: No results for input(s): PROCALCITON, LATICACIDVEN in the last 168 hours.  Recent Results (from the past 240 hour(s))  Urine culture     Status: None   Collection Time: 06/26/19  2:52 AM   Specimen: Urine, Random  Result Value Ref Range Status   Specimen Description URINE, RANDOM  Final   Special Requests NONE  Final   Culture   Final    NO GROWTH Performed at [facility], [address] [place], [place] [address]    Report Status 06/27/2019 FINAL  Final  SARS Coronavirus 2 by RT PCR (hospital order, performed in [facility] hospital lab) Nasopharyngeal Nasopharyngeal Swab     Status: None   Collection Time: 06/26/19  5:46 AM   Specimen: Nasopharyngeal Swab  Result Value Ref Range Status   SARS Coronavirus 2 NEGATIVE NEGATIVE Final    Comment: (NOTE) SARS-CoV-2 target nucleic acids are NOT  DETECTED.  The SARS-CoV-2 RNA is generally detectable in upper and lower respiratory specimens during the acute phase of infection. The lowest concentration of SARS-CoV-2 viral copies this assay can detect is 250 copies / mL. A negative result does not preclude SARS-CoV-2 infection and should not be used as the sole basis for treatment or other patient management decisions.  A negative result may occur with improper  specimen collection / handling, submission of specimen other than nasopharyngeal swab, presence of viral mutation(s) within the areas targeted by this assay, and inadequate number of viral copies (<250 copies / mL). A negative result must be combined with clinical observations, patient history, and epidemiological information.  Fact Sheet for Patients:   BoilerBrush.com.cy  Fact Sheet for Healthcare Providers: https://pope.com/  This test is not yet approved or  cleared by the [place] FDA and has been authorized for detection and/or diagnosis of SARS-CoV-2 by FDA under an Emergency Use Authorization (EUA).  This EUA will remain in effect (meaning this test can be used) for the duration of the COVID-19 declaration under Section 564(b)(1) of the Act, 21 U.S.C. section 360bbb-3(b)(1), unless the authorization is terminated or revoked sooner.  Performed at [facility], [address], [place], [place] [address]       Radiology Studies: No results found.  Scheduled Meds: . amLODipine  5 mg Oral Daily  . gabapentin  300 mg Oral BID  . insulin aspart  0-9 Units Subcutaneous TID WC  . isosorbide mononitrate  30 mg Oral Daily  . metoprolol succinate  50 mg Oral Daily  . pantoprazole  40 mg Oral BID  . Ensure Max Protein  11 oz Oral BID  . rosuvastatin  20 mg Oral Daily  . sodium chloride flush  3 mL Intravenous Q12H   Continuous Infusions: . sodium chloride 125 mL/hr at 06/28/19 1040     LOS: 2 days    Time spent: 30 minutes   Hughie Closs, MD [facility]  06/28/2019, 1:46 PM   To contact the attending provider between 7A-7P or the covering provider during after hours 7P-7A, please log into the web site [email or web address].

## 2019-06-29 LAB — CBC WITH DIFFERENTIAL/PLATELET
Abs Immature Granulocytes: 0 10*3/uL (ref 0.00–0.07)
Basophils Absolute: 0 10*3/uL (ref 0.0–0.1)
Basophils Relative: 0 %
Eosinophils Absolute: 0 10*3/uL (ref 0.0–0.5)
Eosinophils Relative: 1 %
HCT: 39.2 % (ref 36.0–46.0)
Hemoglobin: 12.2 g/dL (ref 12.0–15.0)
Immature Granulocytes: 0 %
Lymphocytes Relative: 15 %
Lymphs Abs: 0.5 10*3/uL — ABNORMAL LOW (ref 0.7–4.0)
MCH: 27.2 pg (ref 26.0–34.0)
MCHC: 31.1 g/dL (ref 30.0–36.0)
MCV: 87.3 fL (ref 80.0–100.0)
Monocytes Absolute: 0.4 10*3/uL (ref 0.1–1.0)
Monocytes Relative: 13 %
Neutro Abs: 2.3 10*3/uL (ref 1.7–7.7)
Neutrophils Relative %: 71 %
Platelets: 73 10*3/uL — ABNORMAL LOW (ref 150–400)
RBC: 4.49 MIL/uL (ref 3.87–5.11)
RDW: 19.6 % — ABNORMAL HIGH (ref 11.5–15.5)
WBC: 3.2 10*3/uL — ABNORMAL LOW (ref 4.0–10.5)
nRBC: 0 % (ref 0.0–0.2)

## 2019-06-29 LAB — BASIC METABOLIC PANEL
Anion gap: 8 (ref 5–15)
BUN: 32 mg/dL — ABNORMAL HIGH (ref 6–20)
CO2: 22 mmol/L (ref 22–32)
Calcium: 8.4 mg/dL — ABNORMAL LOW (ref 8.9–10.3)
Chloride: 102 mmol/L (ref 98–111)
Creatinine, Ser: 1.31 mg/dL — ABNORMAL HIGH (ref 0.44–1.00)
GFR calc Af Amer: 54 mL/min — ABNORMAL LOW (ref >60)
GFR calc non Af Amer: 47 mL/min — ABNORMAL LOW (ref >60)
Glucose, Bld: 259 mg/dL — ABNORMAL HIGH (ref 70–99)
Potassium: 4.2 mmol/L (ref 3.5–5.1)
Sodium: 132 mmol/L — ABNORMAL LOW (ref 135–145)

## 2019-06-29 LAB — GLUCOSE, CAPILLARY
Glucose-Capillary: 225 mg/dL — ABNORMAL HIGH (ref 70–99)
Glucose-Capillary: 260 mg/dL — ABNORMAL HIGH (ref 70–99)

## 2019-06-29 MED ORDER — BISACODYL 10 MG RE SUPP
10.0000 mg | Freq: Once | RECTAL | Status: AC
  Administered 2019-06-29: 10 mg via RECTAL

## 2019-06-29 MED ORDER — AMLODIPINE BESYLATE 5 MG PO TABS: 5.0000 mg | ORAL_TABLET | Freq: Every day | ORAL | 0 refills | Status: DC

## 2019-06-29 MED ORDER — ONDANSETRON 4 MG PO TBDP: 4.0000 mg | ORAL_TABLET | Freq: Three times a day (TID) | ORAL | 0 refills | Status: DC | PRN

## 2019-06-29 MED FILL — AMLODIPINE BESYLATE 5 MG TA: 5 | 30 days supply | Qty: 30 | Fill #0

## 2019-06-29 MED FILL — ONDANSETRON ODT 4 MG TABLET: 4 | 6 days supply | Qty: 20 | Fill #0

## 2019-06-29 NOTE — Discharge Summary (Signed)
Physician Discharge Summary  [name] ZDG:[id number] DOB: July 05, 1965 DOA: 06/25/2019  PCP: Nicholas Lose, MD  Admit date: 06/25/2019 Discharge date: 06/29/2019  Admitted From: Home Disposition: Home  Recommendations for Outpatient Follow-up:  1. Follow up with PCP in 1-2 weeks 2. Follow with oncology for thrombocytopenia 3. Please obtain BMP/CBC in one week 4. Please follow up with your PCP on the following pending results: Unresulted Labs (From admission, onward) Comment         None       Home Health: Yes Equipment/Devices: None  Discharge Condition: Stable CODE STATUS: Full code Diet recommendation: Cardiac  Subjective: Seen and examined.  Feels much better.  No nausea.  No more vomiting since last 2 days.  No abdominal pain.  Tolerating diet.  Willing to go home.  Brief/Interim Summary: [name] a 54 y.o.femalewith medical history significant ofhypertension, DM type II, ITP, anemia, nephrolithiasis, and s/pright BKApresented with complaints of nausea and vomiting over the last 3 days. Unable to keep any food or liquids down. Associated symptoms include midline abdominal pain that does not radiate, constipation with last bowel movement prior to the onset of symptoms, generalized weakness, blood in stool, and dysuria. Notes dysuria usually occurs when platelet counts are low. She had been receiving infusions of immune globulin due to an acute flare of her ITP since april, but she had missed her last 2 appointments due to transportation issues.Denied having any fevers, cough, chest pain, shortness of breath  Upon admission into the emergency department patient was seen to be afebrile with pulse 102-115, respiration 14-27, blood pressure elevated up to 222/105, and O2 saturations maintained on room air. Labs significant for platelets 46, BUN 39, creatinine 2.14(baselineCraround 1.21), and glucose 381.LFTs and lipase are within normal limits. Abdominal  series x-ray noted stable cardiomegaly without signs of obstruction. Urinalysis significant for large hemoglobin, greater than 50 RBCs/hpf, and 21-50 WBCs.CT of theabdomen and pelvis did not note any acute abnormalities and a 3 mm nonobstructing right renal calculus. Patient was given 1 L normal saline IV fluids, antiemetics, hydralazine 10 mg IV, andmetoprolol 50 mg p.o. COVID-19 screening negative.  She was admitted under hospitalist service with nausea and vomiting of unknown etiology.  Conservative management initiated.  Initially kept n.p.o along with IV fluids.  She also came in with slightly elevated creatinine making the diagnosis of acute kidney injury superimposed on chronic kidney disease stage IIIb.  Slowly and gradually, she improved in her diet was started on clears and advanced to soft which she has been tolerating.  She has not had any vomiting since last more than 2 days.  Her renal function has improved and is back to her baseline.  She was also seen by Dr. Lindi Adie of oncology who had arranged 1 dose of IVIG for her.  Upon admission, her platelets had improved from previously.  They were 16 on 05/16/2019 and 46 at the time of admission which have now improved to 73 today.  No signs of bleeding.  Her blood pressure was slightly elevated making the diagnosis of hypertensive urgency.  Amlodipine 5 mg p.o. was added yesterday to her current regimen her blood pressure improved today.  Now that she is stable so she is going to be discharged home with home health care.  She is being prescribed amlodipine to take daily for hypertension and Zofran ODT as needed.  Discharge Diagnoses:  Active Problems:   Diabetes mellitus ([place])   Acute kidney injury superimposed on chronic kidney  disease ([place])   S/P BKA (below knee amputation) unilateral (HCC)   Dehydration   Idiopathic thrombocytopenic purpura (ITP) (HCC)   Nausea and vomiting   Hematuria   Hypertensive urgency    Discharge  Instructions   Allergies as of 06/29/2019   No Known Allergies     Medication List    TAKE these medications   acetaminophen 500 MG tablet Commonly known as: TYLENOL Take 1,000 mg by mouth every 6 (six) hours as needed for mild pain.   amLODipine 5 MG tablet Commonly known as: NORVASC Take 1 tablet (5 mg total) by mouth daily.   furosemide 40 MG tablet Commonly known as: LASIX Take 1 tablet (40 mg total) by mouth 2 (two) times daily.   gabapentin 400 MG capsule Commonly known as: NEURONTIN Take 1 capsule (400 mg total) by mouth 2 (two) times daily.   isosorbide mononitrate 30 MG 24 hr tablet Commonly known as: IMDUR Take 1 tablet (30 mg total) by mouth daily.   metFORMIN 500 MG tablet Commonly known as: Glucophage Take 1 tablet (500 mg total) by mouth 2 (two) times daily with a meal. What changed: when to take this   metoprolol succinate 50 MG 24 hr tablet Commonly known as: TOPROL-XL Take 1 tablet (50 mg total) by mouth daily. Take with or immediately following a meal.   ondansetron 4 MG disintegrating tablet Commonly known as: Zofran ODT Take 1 tablet (4 mg total) by mouth every 8 (eight) hours as needed for nausea or vomiting.   Prodigy Autocode Blood Glucose w/Device Kit 1 kit by Does not apply route in the morning, at noon, in the evening, and at bedtime.   rosuvastatin 20 MG tablet Commonly known as: CRESTOR Take 1 tablet (20 mg total) by mouth daily.       Follow-up Information    Nicholas Lose, MD.   Specialty: Hematology and Oncology Contact information: [place] [address] [phone]              No Known Allergies  Consultations: Oncology   Procedures/Studies: CT ABDOMEN PELVIS WO CONTRAST  Result Date: 06/26/2019 CLINICAL DATA:  Nausea and vomiting EXAM: CT ABDOMEN AND PELVIS WITHOUT CONTRAST TECHNIQUE: Multidetector CT imaging of the abdomen and pelvis was performed following the standard protocol  without IV contrast. COMPARISON:  Abdominal radiograph 06/26/2019 08/29/2015 CT abdomen pelvis FINDINGS: LOWER CHEST: Normal. HEPATOBILIARY: Normal hepatic contours. No intra- or extrahepatic biliary dilatation. The gallbladder is normal. PANCREAS: Normal pancreas. No ductal dilatation or peripancreatic fluid collection. SPLEEN: Normal. ADRENALS/URINARY TRACT: The adrenal glands are normal. 3 mm right renal interpolar calculus. No hydronephrosis. The urinary bladder is normal for degree of distention STOMACH/BOWEL: There is no hiatal hernia. Normal duodenal course and caliber. No small bowel dilatation or inflammation. No focal colonic abnormality. Normal appendix. VASCULAR/LYMPHATIC: Normal course and caliber of the major abdominal vessels. No abdominal or pelvic lymphadenopathy. REPRODUCTIVE: Normal uterus.  No adnexal mass. MUSCULOSKELETAL. No bony spinal canal stenosis or focal osseous abnormality. OTHER: None. IMPRESSION: 1. No acute abnormality of the abdomen or pelvis. 2. 3 mm nonobstructive right renal interpolar calculus. Electronically Signed   By: Ulyses Jarred M.D.   On: 06/26/2019 03:51   DG Abdomen Acute W/Chest  Result Date: 06/26/2019 CLINICAL DATA:  Vomiting and abdominal pain. EXAM: DG ABDOMEN ACUTE W/ 1V CHEST COMPARISON:  Chest radiograph 05/16/2019 FINDINGS: Stable cardiomegaly. No acute airspace disease. No pleural fluid. No free intra-abdominal air. No bowel dilatation to suggest obstruction.  There are no radiopaque calculi or abnormal soft tissue calcifications. Vascular calcifications. Small to moderate volume of colonic stool. Mild degenerative change of the hips. IMPRESSION: 1. Normal bowel gas pattern. No free air. 2. Stable cardiomegaly. Electronically Signed   By: Keith Rake M.D.   On: 06/26/2019 02:28      Discharge Exam: Vitals:   06/28/19 2034 06/29/19 0513  BP: (!) 143/82 (!) 152/67  Pulse: 77 82  Resp: 18 18  Temp: 98.4 F (36.9 C) 98 F (36.7 C)  SpO2: 98%  100%   Vitals:   06/28/19 0920 06/28/19 1751 06/28/19 2034 06/29/19 0513  BP: (!) 169/80 140/80 (!) 143/82 (!) 152/67  Pulse: 79 75 77 82  Resp: _0 Temp: 98.9 F (37.2 C) 98 F (36.7 C) 98.4 F (36.9 C) 98 F (36.7 C)  TempSrc: Oral  Oral   SpO2: 96% 99% 98% 100%  Weight:   95.4 kg   Height:        General: Pt is alert, awake, not in acute distress Cardiovascular: RRR, S1/S2 +, no rubs, no gallops Respiratory: CTA bilaterally, no wheezing, no rhonchi Abdominal: Soft, NT, ND, bowel sounds + Extremities: no edema, no cyanosis    The results of significant diagnostics from this hospitalization (including imaging, microbiology, ancillary and laboratory) are listed below for reference.     Microbiology: Recent Results (from the past 240 hour(s))  Urine culture     Status: None   Collection Time: 06/26/19  2:52 AM   Specimen: Urine, Random  Result Value Ref Range Status   Specimen Description URINE, RANDOM  Final   Special Requests NONE  Final   Culture   Final    NO GROWTH Performed at [facility], [address], [place], [place] [address]    Report Status 06/27/2019 FINAL  Final  SARS Coronavirus 2 by RT PCR (hospital order, performed in [facility] hospital lab) Nasopharyngeal Nasopharyngeal Swab     Status: None   Collection Time: 06/26/19  5:46 AM   Specimen: Nasopharyngeal Swab  Result Value Ref Range Status   SARS Coronavirus 2 NEGATIVE NEGATIVE Final    Comment: (NOTE) SARS-CoV-2 target nucleic acids are NOT DETECTED.  The SARS-CoV-2 RNA is generally detectable in upper and lower respiratory specimens during the acute phase of infection. The lowest concentration of SARS-CoV-2 viral copies this assay can detect is 250 copies / mL. A negative result does not preclude SARS-CoV-2 infection and should not be used as the sole basis for treatment or other patient management decisions.  A negative result may occur with improper specimen collection /  handling, submission of specimen other than nasopharyngeal swab, presence of viral mutation(s) within the areas targeted by this assay, and inadequate number of viral copies (<250 copies / mL). A negative result must be combined with clinical observations, patient history, and epidemiological information.  Fact Sheet for Patients:   StrictlyIdeas.no  Fact Sheet for Healthcare Providers: BankingDealers.co.za  This test is not yet approved or  cleared by the [place] FDA and has been authorized for detection and/or diagnosis of SARS-CoV-2 by FDA under an Emergency Use Authorization (EUA).  This EUA will remain in effect (meaning this test can be used) for the duration of the COVID-19 declaration under Section 564(b)(1) of the Act, 21 U.S.C. section 360bbb-3(b)(1), unless the authorization is terminated or revoked sooner.  Performed at [facility], [place] [address], [place], [place] [address]  Labs: BNP (last 3 results) Recent Labs    04/11/19 0351 04/14/19 0842 04/15/19 0418  BNP 912.4* 452.2* 081.4*   Basic Metabolic Panel: Recent Labs  Lab 06/26/19 0124 06/27/19 0440 06/28/19 0941 06/29/19 0612  NA 135 140 133* 132*  K 4.3 4.1 3.6 4.2  CL 98 106 103 102  CO2 24 20* 22 22  GLUCOSE 381* 183* 179* 259*  BUN 39* 47* 38* 32*  CREATININE 2.14* 2.09* 1.41* 1.31*  CALCIUM 9.4 9.1 8.5* 8.4*   Liver Function Tests: Recent Labs  Lab 06/26/19 0124  AST 30  ALT 21  ALKPHOS 92  BILITOT 1.0  PROT 9.0*  ALBUMIN 3.8   Recent Labs  Lab 06/26/19 0124  LIPASE 23   No results for input(s): AMMONIA in the last 168 hours. CBC: Recent Labs  Lab 06/26/19 0124 06/27/19 0440 06/29/19 0612  WBC 7.5 6.4 3.2*  NEUTROABS  --   --  2.3  HGB 14.6 13.3 12.2  HCT 46.3* 43.6 39.2  MCV 85.4 88.1 87.3  PLT 46* 45* 73*   Cardiac Enzymes: No results for input(s): CKTOTAL, CKMB, CKMBINDEX, TROPONINI in the last 168  hours. BNP: Invalid input(s): POCBNP CBG: Recent Labs  Lab 06/28/19 0635 06/28/19 1120 06/28/19 1623 06/28/19 2033 06/29/19 0658  GLUCAP 163* 203* 190* 179* 225*   D-Dimer No results for input(s): DDIMER in the last 72 hours. Hgb A1c No results for input(s): HGBA1C in the last 72 hours. Lipid Profile No results for input(s): CHOL, HDL, LDLCALC, TRIG, CHOLHDL, LDLDIRECT in the last 72 hours. Thyroid function studies No results for input(s): TSH, T4TOTAL, T3FREE, THYROIDAB in the last 72 hours.  Invalid input(s): FREET3 Anemia work up No results for input(s): VITAMINB12, FOLATE, FERRITIN, TIBC, IRON, RETICCTPCT in the last 72 hours. Urinalysis    Component Value Date/Time   COLORURINE YELLOW 06/26/2019 0250   APPEARANCEUR HAZY (A) 06/26/2019 0250   LABSPEC 1.025 06/26/2019 0250   PHURINE 6.0 06/26/2019 0250   GLUCOSEU >=500 (A) 06/26/2019 0250   HGBUR LARGE (A) 06/26/2019 0250   BILIRUBINUR NEGATIVE 06/26/2019 0250   KETONESUR 5 (A) 06/26/2019 0250   PROTEINUR >=300 (A) 06/26/2019 0250   UROBILINOGEN 2.0 (H) 07/08/2011 0051   NITRITE NEGATIVE 06/26/2019 0250   LEUKOCYTESUR NEGATIVE 06/26/2019 0250   Sepsis Labs Invalid input(s): PROCALCITONIN,  WBC,  LACTICIDVEN Microbiology Recent Results (from the past 240 hour(s))  Urine culture     Status: None   Collection Time: 06/26/19  2:52 AM   Specimen: Urine, Random  Result Value Ref Range Status   Specimen Description URINE, RANDOM  Final   Special Requests NONE  Final   Culture   Final    NO GROWTH Performed at [facility], [place] [address] [place], [place] [address]    Report Status 06/27/2019 FINAL  Final  SARS Coronavirus 2 by RT PCR (hospital order, performed in [facility] hospital lab) Nasopharyngeal Nasopharyngeal Swab     Status: None   Collection Time: 06/26/19  5:46 AM   Specimen: Nasopharyngeal Swab  Result Value Ref Range Status   SARS Coronavirus 2 NEGATIVE NEGATIVE Final    Comment:  (NOTE) SARS-CoV-2 target nucleic acids are NOT DETECTED.  The SARS-CoV-2 RNA is generally detectable in upper and lower respiratory specimens during the acute phase of infection. The lowest concentration of SARS-CoV-2 viral copies this assay can detect is 250 copies / mL. A negative result does not preclude SARS-CoV-2 infection and should not be used as  the sole basis for treatment or other patient management decisions.  A negative result may occur with improper specimen collection / handling, submission of specimen other than nasopharyngeal swab, presence of viral mutation(s) within the areas targeted by this assay, and inadequate number of viral copies (<250 copies / mL). A negative result must be combined with clinical observations, patient history, and epidemiological information.  Fact Sheet for Patients:   StrictlyIdeas.no  Fact Sheet for Healthcare Providers: BankingDealers.co.za  This test is not yet approved or  cleared by the [place] FDA and has been authorized for detection and/or diagnosis of SARS-CoV-2 by FDA under an Emergency Use Authorization (EUA).  This EUA will remain in effect (meaning this test can be used) for the duration of the COVID-19 declaration under Section 564(b)(1) of the Act, 21 U.S.C. section 360bbb-3(b)(1), unless the authorization is terminated or revoked sooner.  Performed at [facility], [place] [address] [place], [place] [address]      Time coordinating discharge: Over 30 minutes  SIGNED:   Darliss Cheney, MD  [facility] 06/29/2019, 9:14 AM  If 7PM-7AM, please contact night-coverage [email or web address]

## 2019-06-29 NOTE — Progress Notes (Signed)
[name] to be discharged Home per MD order. Discussed prescriptions and medication list explained in detail. Patient verbalized understanding.  TLC brought the medications.    Skin clean, dry and intact without evidence of skin break down, no evidence of skin tears noted. IV catheter discontinued intact. Site without signs and symptoms of complications. Dressing and pressure applied. Pt denies pain at the site currently. No complaints noted.  Patient free of lines, drains, and wounds.   An After Visit Summary (AVS) was printed and given to the patient. Patient escorted via wheelchair, and discharged home via cab who is known to the patient according to her.  Arvilla Meres, RN

## 2019-06-29 NOTE — Care Management Important Message (Signed)
Important Message  Patient Details  Name: [name] MRN: [id number] Date of Birth: [birth year]   Medicare Important Message Given:  Yes     Kess Mcilwain 06/29/2019, 1:02 PM

## 2019-06-29 NOTE — TOC Transition Note (Signed)
Transition of Care [facility]) - CM/SW Discharge Note   Patient Details  Name: [name] MRN: [id number] Date of Birth: August 21, 1965  [facility] CM/SW Contact:  Bess Kinds, RN Phone Number: [phone] 06/29/2019, 10:07 AM   Clinical Narrative:     Patient to transition home today. Notified by [facility] that patient is active for RN and PT. MD notified for [facility] orders for resumption of services. No further TOC needs identified at this time.  Final next level of care: Home w Home Health Services Barriers to Discharge: No Barriers Identified   Patient Goals and CMS Choice Patient states their goals for this hospitalization and ongoing recovery are:: home CMS Medicare.gov Compare Post Acute Care list provided to:: Patient Choice offered to / list presented to : Patient  Discharge Placement                       Discharge Plan and Services                DME Arranged: N/A DME Agency: NA       HH Arranged: RN, PT HH Agency: [facility] Date HH Agency Contacted: 06/29/19 Time HH Agency Contacted: 1000 Representative spoke with at [facility] Agency: Lupita Leash  Social Determinants of Health (SDOH) Interventions     Readmission Risk Interventions Readmission Risk Prevention Plan 04/13/2019 11/13/2018  Transportation Screening Complete Complete  PCP or Specialist Appt within 5-7 Days - Complete  PCP or Specialist Appt within 3-5 Days Complete -  Home Care Screening - Complete  Medication Review (RN CM) - Complete  HRI or Home Care Consult Complete -  Social Work Consult for Recovery Care Planning/Counseling Complete -  Palliative Care Screening Not Applicable -  Medication Review Oceanographer) Complete -  Some recent data might be hidden

## 2019-07-03 ENCOUNTER — Telehealth: Payer: Self-pay | Admitting: Hematology and Oncology

## 2019-07-03 NOTE — Telephone Encounter (Signed)
Scheduled appt per 6/28 sch message- unable to reach pt .left message with appt date and time

## 2019-07-04 ENCOUNTER — Telehealth: Payer: Self-pay | Admitting: Nurse Practitioner

## 2019-07-11 ENCOUNTER — Telehealth: Payer: Self-pay | Admitting: *Deleted

## 2019-07-11 NOTE — Telephone Encounter (Signed)
Received VM from pt.  Attempt x1 to return call, no answer, LVM to return call to the office.  

## 2019-07-12 NOTE — Telephone Encounter (Signed)
Sent to NP 

## 2019-07-13 ENCOUNTER — Telehealth: Payer: Self-pay | Admitting: Hematology and Oncology

## 2019-07-13 NOTE — Telephone Encounter (Signed)
R/s appt per 7/7 sch msg to add iv iron - pt is aware of appt on 7/13 moved to 7/14

## 2019-07-17 ENCOUNTER — Inpatient Hospital Stay: Payer: Medicare Other

## 2019-07-17 ENCOUNTER — Inpatient Hospital Stay: Payer: Medicare Other | Admitting: Hematology and Oncology

## 2019-07-17 NOTE — Progress Notes (Signed)
Patient Care Team: Nicholas Lose, MD as PCP - General (Hematology and Oncology)  DIAGNOSIS:    ICD-10-CM   1. Idiopathic thrombocytopenic purpura (ITP) (HCC)  D69.3     CHIEF COMPLIANT: Follow-up of relapsed ITP on Rituxan  INTERVAL HISTORY: [name] is a 54 y.o. with above-mentioned history of severe thrombocytopeniacurrently on Rituxan and IVIG. She was admitted from 06/26/19-06/29/19 after presenting to the ED for nausea and vomiting x3 days. She received one dose of IVIG. She presents to the clinic todayforfollow-up.  Today her platelet counts are only 27.  She has not had any bruising or bleeding.  She unfortunately cannot undergo cataract surgery at these levels.  She will need IVIG treatment sometime next week.  ALLERGIES:  has No Known Allergies.  MEDICATIONS:  Current Outpatient Medications  Medication Sig Dispense Refill  . acetaminophen (TYLENOL) 500 MG tablet Take 1,000 mg by mouth every 6 (six) hours as needed for mild pain.    Marland Kitchen amLODipine (NORVASC) 5 MG tablet Take 1 tablet (5 mg total) by mouth daily. 30 tablet 0  . Blood Glucose Monitoring Suppl (PRODIGY AUTOCODE BLOOD GLUCOSE) w/Device KIT 1 kit by Does not apply route in the morning, at noon, in the evening, and at bedtime. 1 kit 0  . furosemide (LASIX) 40 MG tablet Take 1 tablet (40 mg total) by mouth 2 (two) times daily. 30 tablet 0  . gabapentin (NEURONTIN) 400 MG capsule Take 1 capsule (400 mg total) by mouth 2 (two) times daily. 60 capsule 0  . isosorbide mononitrate (IMDUR) 30 MG 24 hr tablet Take 1 tablet (30 mg total) by mouth daily. 90 tablet 1  . metFORMIN (GLUCOPHAGE) 500 MG tablet Take 1 tablet (500 mg total) by mouth 2 (two) times daily with a meal. (Patient taking differently: Take 500 mg by mouth daily with breakfast. ) 60 tablet 1  . metoprolol succinate (TOPROL-XL) 50 MG 24 hr tablet Take 1 tablet (50 mg total) by mouth daily. Take with or immediately following a meal. 90 tablet 1  .  ondansetron (ZOFRAN ODT) 4 MG disintegrating tablet Take 1 tablet (4 mg total) by mouth every 8 (eight) hours as needed for nausea or vomiting. 20 tablet 0  . ondansetron (ZOFRAN-ODT) 4 MG disintegrating tablet Take 1 tablet (4 mg total) by mouth every 8 (eight) hours as needed for nausea or vomiting. 20 tablet 0  . rosuvastatin (CRESTOR) 20 MG tablet Take 1 tablet (20 mg total) by mouth daily. 90 tablet 1   No current facility-administered medications for this visit.    PHYSICAL EXAMINATION: ECOG PERFORMANCE STATUS: 1 - Symptomatic but completely ambulatory  Vitals:   07/18/19 1313  BP: (!) 129/55  Pulse: 68  Resp: 17  Temp: 98.9 F (37.2 C)  SpO2: 94%   There were no vitals filed for this visit.  LABORATORY DATA:  I have reviewed the data as listed CMP Latest Ref Rng & Units 07/18/2019 06/29/2019 06/28/2019  Glucose 70 - 99 mg/dL 136(H) 259(H) 179(H)  BUN 6 - 20 mg/dL 18 32(H) 38(H)  Creatinine 0.44 - 1.00 mg/dL 1.29(H) 1.31(H) 1.41(H)  Sodium 135 - 145 mmol/L 137 132(L) 133(L)  Potassium 3.5 - 5.1 mmol/L 5.2(H) 4.2 3.6  Chloride 98 - 111 mmol/L 105 102 103  CO2 22 - 32 mmol/L '22 22 22  ' Calcium 8.9 - 10.3 mg/dL 8.6(L) 8.4(L) 8.5(L)  Total Protein 6.5 - 8.1 g/dL 7.5 - -  Total Bilirubin 0.3 - 1.2 mg/dL 0.3 - -  Alkaline Phos 38 - 126 U/L 71 - -  AST 15 - 41 U/L 23 - -  ALT 0 - 44 U/L 16 - -    Lab Results  Component Value Date   WBC 4.4 07/18/2019   HGB 10.7 (L) 07/18/2019   HCT 34.8 (L) 07/18/2019   MCV 89.5 07/18/2019   PLT 22 (L) 07/18/2019   NEUTROABS 2.6 07/18/2019    ASSESSMENT & PLAN:  Idiopathic thrombocytopenic purpura (ITP) (HCC) Relapsing ITP: Patient response to steroids and IVIG but because she is a diabetic we cannot use steroids. IVIG response appeared to only last for a couple of months. Prior IVIG: March 2021, April 2021 Because of her history of heart failure I am also worried about the volume of fluid that she gets with IVIG. Lab review:  Platelet count 16 on 05/16/2019 ----------------------------------------------------------------------------------------------------------------------------------------------------------------- Current treatment: Rituxan weekly x4 started 05/23/2019 (patient missed her treatment on 05/30/2019) today is third treatment (patient missed multiple appointments) Repeat IVIG treatment 06/27/2019 Rituxan toxicities: Headaches and body aches after Rituxan. Hospitalization: 06/25/2019-06/29/2019: Nausea and vomiting  Lab review:  As soon as her platelets improve we will get in touch with her ophthalmologist to get her eye surgery complete. Our goal is to keep the platelets above 50,000 for her cataract surgery.  For this we will obtain IVIG treatment sometime next week. The following week we will recheck her red blood count and decide on additional treatment Today she will receive Rituxan.   No orders of the defined types were placed in this encounter.  The patient has a good understanding of the overall plan. she agrees with it. she will call with any problems that may develop before the next visit here.  Total time spent: 30 mins including face to face time and time spent for planning, charting and coordination of care  Nicholas Lose, MD 07/18/2019  I, Cloyde Reams Dorshimer, am acting as scribe for Dr. Nicholas Lose.  I have reviewed the above documentation for accuracy and completeness, and I agree with the above.

## 2019-07-18 ENCOUNTER — Inpatient Hospital Stay: Payer: Medicare Other

## 2019-07-18 ENCOUNTER — Other Ambulatory Visit: Payer: Self-pay

## 2019-07-18 ENCOUNTER — Inpatient Hospital Stay: Payer: Medicare Other | Attending: Hematology and Oncology

## 2019-07-18 ENCOUNTER — Inpatient Hospital Stay (HOSPITAL_BASED_OUTPATIENT_CLINIC_OR_DEPARTMENT_OTHER): Payer: Medicare Other | Admitting: Hematology and Oncology

## 2019-07-18 VITALS — BP 110/76 | HR 72 | Temp 97.9°F | Resp 18

## 2019-07-18 DIAGNOSIS — I110 Hypertensive heart disease with heart failure: Secondary | ICD-10-CM | POA: Insufficient documentation

## 2019-07-18 DIAGNOSIS — R509 Fever, unspecified: Secondary | ICD-10-CM | POA: Diagnosis not present

## 2019-07-18 DIAGNOSIS — I509 Heart failure, unspecified: Secondary | ICD-10-CM | POA: Diagnosis not present

## 2019-07-18 DIAGNOSIS — Z8673 Personal history of transient ischemic attack (TIA), and cerebral infarction without residual deficits: Secondary | ICD-10-CM | POA: Insufficient documentation

## 2019-07-18 DIAGNOSIS — D693 Immune thrombocytopenic purpura: Secondary | ICD-10-CM | POA: Insufficient documentation

## 2019-07-18 DIAGNOSIS — R067 Sneezing: Secondary | ICD-10-CM | POA: Insufficient documentation

## 2019-07-18 DIAGNOSIS — G9389 Other specified disorders of brain: Secondary | ICD-10-CM | POA: Diagnosis not present

## 2019-07-18 DIAGNOSIS — D696 Thrombocytopenia, unspecified: Secondary | ICD-10-CM

## 2019-07-18 DIAGNOSIS — E119 Type 2 diabetes mellitus without complications: Secondary | ICD-10-CM | POA: Diagnosis not present

## 2019-07-18 DIAGNOSIS — Z7984 Long term (current) use of oral hypoglycemic drugs: Secondary | ICD-10-CM | POA: Diagnosis not present

## 2019-07-18 DIAGNOSIS — Z1152 Encounter for screening for COVID-19: Secondary | ICD-10-CM | POA: Insufficient documentation

## 2019-07-18 DIAGNOSIS — R519 Headache, unspecified: Secondary | ICD-10-CM | POA: Diagnosis not present

## 2019-07-18 DIAGNOSIS — Z79899 Other long term (current) drug therapy: Secondary | ICD-10-CM | POA: Diagnosis not present

## 2019-07-18 DIAGNOSIS — M1990 Unspecified osteoarthritis, unspecified site: Secondary | ICD-10-CM | POA: Insufficient documentation

## 2019-07-18 DIAGNOSIS — R000 Tachycardia, unspecified: Secondary | ICD-10-CM | POA: Diagnosis not present

## 2019-07-18 LAB — CBC WITH DIFFERENTIAL (CANCER CENTER ONLY)
Abs Immature Granulocytes: 0.01 10*3/uL (ref 0.00–0.07)
Basophils Absolute: 0 10*3/uL (ref 0.0–0.1)
Basophils Relative: 0 %
Eosinophils Absolute: 0.1 10*3/uL (ref 0.0–0.5)
Eosinophils Relative: 1 %
HCT: 34.8 % — ABNORMAL LOW (ref 36.0–46.0)
Hemoglobin: 10.7 g/dL — ABNORMAL LOW (ref 12.0–15.0)
Immature Granulocytes: 0 %
Lymphocytes Relative: 31 %
Lymphs Abs: 1.3 10*3/uL (ref 0.7–4.0)
MCH: 27.5 pg (ref 26.0–34.0)
MCHC: 30.7 g/dL (ref 30.0–36.0)
MCV: 89.5 fL (ref 80.0–100.0)
Monocytes Absolute: 0.4 10*3/uL (ref 0.1–1.0)
Monocytes Relative: 10 %
Neutro Abs: 2.6 10*3/uL (ref 1.7–7.7)
Neutrophils Relative %: 58 %
Platelet Count: 22 10*3/uL — ABNORMAL LOW (ref 150–400)
RBC: 3.89 MIL/uL (ref 3.87–5.11)
RDW: 17.3 % — ABNORMAL HIGH (ref 11.5–15.5)
WBC Count: 4.4 10*3/uL (ref 4.0–10.5)
nRBC: 0 % (ref 0.0–0.2)

## 2019-07-18 LAB — CMP (CANCER CENTER ONLY)
ALT: 16 U/L (ref 0–44)
AST: 23 U/L (ref 15–41)
Albumin: 3.2 g/dL — ABNORMAL LOW (ref 3.5–5.0)
Alkaline Phosphatase: 71 U/L (ref 38–126)
Anion gap: 10 (ref 5–15)
BUN: 18 mg/dL (ref 6–20)
CO2: 22 mmol/L (ref 22–32)
Calcium: 8.6 mg/dL — ABNORMAL LOW (ref 8.9–10.3)
Chloride: 105 mmol/L (ref 98–111)
Creatinine: 1.29 mg/dL — ABNORMAL HIGH (ref 0.44–1.00)
GFR, Est AFR Am: 55 mL/min — ABNORMAL LOW (ref >60)
GFR, Estimated: 48 mL/min — ABNORMAL LOW (ref >60)
Glucose, Bld: 136 mg/dL — ABNORMAL HIGH (ref 70–99)
Potassium: 5.2 mmol/L — ABNORMAL HIGH (ref 3.5–5.1)
Sodium: 137 mmol/L (ref 135–145)
Total Bilirubin: 0.3 mg/dL (ref 0.3–1.2)
Total Protein: 7.5 g/dL (ref 6.5–8.1)

## 2019-07-18 MED ORDER — ACETAMINOPHEN 325 MG PO TABS
ORAL_TABLET | ORAL | Status: AC
  Filled 2019-07-18: qty 2

## 2019-07-18 MED ORDER — DIPHENHYDRAMINE HCL 25 MG PO CAPS
ORAL_CAPSULE | ORAL | Status: AC
  Filled 2019-07-18: qty 1

## 2019-07-18 MED ORDER — SODIUM CHLORIDE 0.9 % IV SOLN
Freq: Once | INTRAVENOUS | Status: AC
  Filled 2019-07-18: qty 250

## 2019-07-18 MED ORDER — SODIUM CHLORIDE 0.9 % IV SOLN
375.0000 mg/m2 | Freq: Once | INTRAVENOUS | Status: AC
  Administered 2019-07-18: 800 mg via INTRAVENOUS
  Filled 2019-07-18: qty 30

## 2019-07-18 MED ORDER — DIPHENHYDRAMINE HCL 25 MG PO CAPS
50.0000 mg | ORAL_CAPSULE | Freq: Once | ORAL | Status: AC
  Administered 2019-07-18: 50 mg via ORAL

## 2019-07-18 MED ORDER — ACETAMINOPHEN 325 MG PO TABS
650.0000 mg | ORAL_TABLET | Freq: Once | ORAL | Status: AC
  Administered 2019-07-18: 650 mg via ORAL

## 2019-07-18 NOTE — Patient Instructions (Signed)
[facility] Discharge Instructions for Patients Receiving Chemotherapy  Today you received the following chemotherapy agents: rituximab.  To help prevent nausea and vomiting after your treatment, we encourage you to take your nausea medication as directed.   If you develop nausea and vomiting that is not controlled by your nausea medication, call the clinic.   BELOW ARE SYMPTOMS THAT SHOULD BE REPORTED IMMEDIATELY:  *FEVER GREATER THAN 100.5 F  *CHILLS WITH OR WITHOUT FEVER  NAUSEA AND VOMITING THAT IS NOT CONTROLLED WITH YOUR NAUSEA MEDICATION  *UNUSUAL SHORTNESS OF BREATH  *UNUSUAL BRUISING OR BLEEDING  TENDERNESS IN MOUTH AND THROAT WITH OR WITHOUT PRESENCE OF ULCERS  *URINARY PROBLEMS  *BOWEL PROBLEMS  UNUSUAL RASH Items with * indicate a potential emergency and should be followed up as soon as possible.  Feel free to call the clinic should you have any questions or concerns. The clinic phone number is [phone].  Please show the CHEMO ALERT CARD at check-in to the Emergency Department and triage nurse.   

## 2019-07-18 NOTE — Assessment & Plan Note (Signed)
Relapsing ITP: Patient response to steroids and IVIG but because she is a diabetic we cannot use steroids. IVIG response appeared to only last for a couple of months. Prior IVIG: March 2021, April 2021 Because of her history of heart failure I am also worried about the volume of fluid that she gets with IVIG. Lab review: Platelet count 16 on 05/16/2019 ----------------------------------------------------------------------------------------------------------------------------------------------------------------- Current treatment: Rituxan weekly x4 started 05/23/2019 (patient missed her treatment on 05/30/2019) today is third treatment (patient missed multiple appointments) Repeat IVIG treatment 06/27/2019 Rituxan toxicities: Headaches and body aches after Rituxan. Hospitalization: 06/25/2019-06/29/2019: Nausea and vomiting  Lab review:  As soon as her platelets improve we will get in touch with her ophthalmologist to get her eye surgery complete.

## 2019-07-19 ENCOUNTER — Telehealth: Payer: Self-pay | Admitting: Hematology and Oncology

## 2019-07-19 NOTE — Telephone Encounter (Signed)
Scheduled per 7/14 los. Called and spoke with pt, confirmed 7/28 appt

## 2019-07-24 ENCOUNTER — Other Ambulatory Visit: Payer: Self-pay

## 2019-07-24 ENCOUNTER — Inpatient Hospital Stay: Payer: Medicare Other

## 2019-07-24 VITALS — BP 124/57 | HR 79 | Temp 99.6°F | Resp 18 | Wt 219.0 lb

## 2019-07-24 DIAGNOSIS — D693 Immune thrombocytopenic purpura: Secondary | ICD-10-CM | POA: Diagnosis not present

## 2019-07-24 MED ORDER — DIPHENHYDRAMINE HCL 25 MG PO CAPS
ORAL_CAPSULE | ORAL | Status: AC
  Filled 2019-07-24: qty 1

## 2019-07-24 MED ORDER — DIPHENHYDRAMINE HCL 25 MG PO TABS
25.0000 mg | ORAL_TABLET | Freq: Once | ORAL | Status: AC
  Administered 2019-07-24: 25 mg via ORAL
  Filled 2019-07-24: qty 1

## 2019-07-24 MED ORDER — IMMUNE GLOBULIN (HUMAN) 10 GM/100ML IV SOLN
1.0000 g/kg | INTRAVENOUS | Status: DC
  Administered 2019-07-24: 100 g via INTRAVENOUS
  Filled 2019-07-24: qty 1000

## 2019-07-24 MED ORDER — ACETAMINOPHEN 325 MG PO TABS
ORAL_TABLET | ORAL | Status: AC
  Filled 2019-07-24: qty 2

## 2019-07-24 MED ORDER — ACETAMINOPHEN 325 MG PO TABS
650.0000 mg | ORAL_TABLET | Freq: Once | ORAL | Status: AC
  Administered 2019-07-24: 650 mg via ORAL

## 2019-07-24 MED ORDER — DEXTROSE 5 % IV SOLN
Freq: Once | INTRAVENOUS | Status: AC
  Filled 2019-07-24: qty 250

## 2019-07-24 NOTE — Patient Instructions (Signed)
Immune Globulin Injection What is this medicine? IMMUNE GLOBULIN (im MUNE GLOB yoo lin) helps to prevent or reduce the severity of certain infections in patients who are at risk. This medicine is collected from the pooled blood of many donors. It is used to treat immune system problems, thrombocytopenia, and Kawasaki syndrome. This medicine may be used for other purposes; ask your health care provider or pharmacist if you have questions. COMMON BRAND NAME(S): ASCENIV, Baygam, BIVIGAM, Carimune, Carimune NF, cutaquig, Cuvitru, Flebogamma, Flebogamma DIF, GamaSTAN, GamaSTAN S/D, Gamimune N, Gammagard, Gammagard S/D, Gammaked, Gammaplex, Gammar-P IV, Gamunex, Gamunex-C, Hizentra, Iveegam, Iveegam EN, Octagam, Panglobulin, Panglobulin NF, panzyga, Polygam S/D, Privigen, Sandoglobulin, Venoglobulin-S, Vigam, Vivaglobulin, Xembify What should I tell my health care provider before I take this medicine? They need to know if you have any of these conditions:  diabetes  extremely low or no immune antibodies in the blood  heart disease  history of blood clots  hyperprolinemia  infection in the blood, sepsis  kidney disease  recently received or scheduled to receive a vaccination  an unusual or allergic reaction to human immune globulin, albumin, maltose, sucrose, other medicines, foods, dyes, or preservatives  pregnant or trying to get pregnant  breast-feeding How should I use this medicine? This medicine is for injection into a muscle or infusion into a vein or skin. It is usually given by a health care professional in a hospital or clinic setting. In rare cases, some brands of this medicine might be given at home. You will be taught how to give this medicine. Use exactly as directed. Take your medicine at regular intervals. Do not take your medicine more often than directed. Talk to your pediatrician regarding the use of this medicine in children. While this drug may be prescribed for selected  conditions, precautions do apply. Overdosage: If you think you have taken too much of this medicine contact a poison control center or emergency room at once. NOTE: This medicine is only for you. Do not share this medicine with others. What if I miss a dose? It is important not to miss your dose. Call your doctor or health care professional if you are unable to keep an appointment. If you give yourself the medicine and you miss a dose, take it as soon as you can. If it is almost time for your next dose, take only that dose. Do not take double or extra doses. What may interact with this medicine?  aspirin and aspirin-like medicines  cisplatin  cyclosporine  medicines for infection like acyclovir, adefovir, amphotericin B, bacitracin, cidofovir, foscarnet, ganciclovir, gentamicin, pentamidine, vancomycin  NSAIDS, medicines for pain and inflammation, like ibuprofen or naproxen  pamidronate  vaccines  zoledronic acid This list may not describe all possible interactions. Give your health care provider a list of all the medicines, herbs, non-prescription drugs, or dietary supplements you use. Also tell them if you smoke, drink alcohol, or use illegal drugs. Some items may interact with your medicine. What should I watch for while using this medicine? Your condition will be monitored carefully while you are receiving this medicine. This medicine is made from pooled blood donations of many different people. It may be possible to pass an infection in this medicine. However, the donors are screened for infections and all products are tested for HIV and hepatitis. The medicine is treated to kill most or all bacteria and viruses. Talk to your doctor about the risks and benefits of this medicine. Do not have vaccinations for at least   14 days before, or until at least 3 months after receiving this medicine. What side effects may I notice from receiving this medicine? Side effects that you should report  to your doctor or health care professional as soon as possible:  allergic reactions like skin rash, itching or hives, swelling of the face, lips, or tongue  blue colored lips or skin  breathing problems  chest pain or tightness  fever  signs and symptoms of aseptic meningitis such as stiff neck; sensitivity to light; headache; drowsiness; fever; nausea; vomiting; rash  signs and symptoms of a blood clot such as chest pain; shortness of breath; pain, swelling, or warmth in the leg  signs and symptoms of hemolytic anemia such as fast heartbeat; tiredness; dark yellow or brown urine; or yellowing of the eyes or skin  signs and symptoms of kidney injury like trouble passing urine or change in the amount of urine  sudden weight gain  swelling of the ankles, feet, hands Side effects that usually do not require medical attention (report to your doctor or health care professional if they continue or are bothersome):  diarrhea  flushing  headache  increased sweating  joint pain  muscle cramps  muscle pain  nausea  pain, redness, or irritation at site where injected  tiredness This list may not describe all possible side effects. Call your doctor for medical advice about side effects. You may report side effects to FDA at 1-800-FDA-1088. Where should I keep my medicine? Keep out of the reach of children. This drug is usually given in a hospital or clinic and will not be stored at home. In rare cases, some brands of this medicine may be given at home. If you are using this medicine at home, you will be instructed on how to store this medicine. Throw away any unused medicine after the expiration date on the label. NOTE: This sheet is a summary. It may not cover all possible information. If you have questions about this medicine, talk to your doctor, pharmacist, or health care provider.  2020 Elsevier/Gold Standard (2018-07-26 12:51:14)  

## 2019-07-25 ENCOUNTER — Other Ambulatory Visit: Payer: Self-pay

## 2019-07-25 ENCOUNTER — Inpatient Hospital Stay (HOSPITAL_BASED_OUTPATIENT_CLINIC_OR_DEPARTMENT_OTHER): Payer: Medicare Other | Admitting: Medical

## 2019-07-25 ENCOUNTER — Inpatient Hospital Stay: Payer: Medicare Other

## 2019-07-25 VITALS — BP 140/71 | HR 86 | Temp 102.2°F | Resp 18

## 2019-07-25 DIAGNOSIS — J3489 Other specified disorders of nose and nasal sinuses: Secondary | ICD-10-CM

## 2019-07-25 DIAGNOSIS — R509 Fever, unspecified: Secondary | ICD-10-CM | POA: Diagnosis not present

## 2019-07-25 DIAGNOSIS — R067 Sneezing: Secondary | ICD-10-CM | POA: Diagnosis not present

## 2019-07-25 DIAGNOSIS — Z862 Personal history of diseases of the blood and blood-forming organs and certain disorders involving the immune mechanism: Secondary | ICD-10-CM

## 2019-07-25 DIAGNOSIS — D696 Thrombocytopenia, unspecified: Secondary | ICD-10-CM

## 2019-07-25 DIAGNOSIS — D693 Immune thrombocytopenic purpura: Secondary | ICD-10-CM

## 2019-07-25 LAB — CMP (CANCER CENTER ONLY)
ALT: 13 U/L (ref 0–44)
AST: 22 U/L (ref 15–41)
Albumin: 2.8 g/dL — ABNORMAL LOW (ref 3.5–5.0)
Alkaline Phosphatase: 63 U/L (ref 38–126)
Anion gap: 9 (ref 5–15)
BUN: 26 mg/dL — ABNORMAL HIGH (ref 6–20)
CO2: 20 mmol/L — ABNORMAL LOW (ref 22–32)
Calcium: 8.5 mg/dL — ABNORMAL LOW (ref 8.9–10.3)
Chloride: 105 mmol/L (ref 98–111)
Creatinine: 1.74 mg/dL — ABNORMAL HIGH (ref 0.44–1.00)
GFR, Est AFR Am: 38 mL/min — ABNORMAL LOW (ref >60)
GFR, Estimated: 33 mL/min — ABNORMAL LOW (ref >60)
Glucose, Bld: 198 mg/dL — ABNORMAL HIGH (ref 70–99)
Potassium: 4.4 mmol/L (ref 3.5–5.1)
Sodium: 134 mmol/L — ABNORMAL LOW (ref 135–145)
Total Bilirubin: 0.8 mg/dL (ref 0.3–1.2)
Total Protein: 8.4 g/dL — ABNORMAL HIGH (ref 6.5–8.1)

## 2019-07-25 LAB — CBC WITH DIFFERENTIAL (CANCER CENTER ONLY)
Abs Immature Granulocytes: 0.07 10*3/uL (ref 0.00–0.07)
Basophils Absolute: 0 10*3/uL (ref 0.0–0.1)
Basophils Relative: 0 %
Eosinophils Absolute: 0 10*3/uL (ref 0.0–0.5)
Eosinophils Relative: 0 %
HCT: 33.6 % — ABNORMAL LOW (ref 36.0–46.0)
Hemoglobin: 10.3 g/dL — ABNORMAL LOW (ref 12.0–15.0)
Immature Granulocytes: 1 %
Lymphocytes Relative: 10 %
Lymphs Abs: 1 10*3/uL (ref 0.7–4.0)
MCH: 27.5 pg (ref 26.0–34.0)
MCHC: 30.7 g/dL (ref 30.0–36.0)
MCV: 89.8 fL (ref 80.0–100.0)
Monocytes Absolute: 1.4 10*3/uL — ABNORMAL HIGH (ref 0.1–1.0)
Monocytes Relative: 15 %
Neutro Abs: 7.3 10*3/uL (ref 1.7–7.7)
Neutrophils Relative %: 74 %
Platelet Count: 62 10*3/uL — ABNORMAL LOW (ref 150–400)
RBC: 3.74 MIL/uL — ABNORMAL LOW (ref 3.87–5.11)
RDW: 18.3 % — ABNORMAL HIGH (ref 11.5–15.5)
WBC Count: 9.8 10*3/uL (ref 4.0–10.5)
nRBC: 0 % (ref 0.0–0.2)

## 2019-07-25 LAB — URINALYSIS, COMPLETE (UACMP) WITH MICROSCOPIC
Bilirubin Urine: NEGATIVE
Glucose, UA: NEGATIVE mg/dL
Ketones, ur: NEGATIVE mg/dL
Leukocytes,Ua: NEGATIVE
Nitrite: NEGATIVE
Protein, ur: >=300 mg/dL — AB
Specific Gravity, Urine: 1.014 (ref 1.005–1.030)
pH: 7 (ref 5.0–8.0)

## 2019-07-25 LAB — RESP PANEL BY RT PCR (RSV, FLU A&B, COVID)
Influenza A by PCR: NEGATIVE
Influenza B by PCR: NEGATIVE
Respiratory Syncytial Virus by PCR: NEGATIVE
SARS Coronavirus 2 by RT PCR: NEGATIVE

## 2019-07-25 MED ORDER — ACETAMINOPHEN 325 MG PO TABS
ORAL_TABLET | ORAL | Status: AC
  Filled 2019-07-25: qty 2

## 2019-07-25 MED ORDER — IMMUNE GLOBULIN (HUMAN) 10 GM/100ML IV SOLN
1.0000 g/kg | INTRAVENOUS | Status: DC
  Administered 2019-07-25: 100 g via INTRAVENOUS
  Filled 2019-07-25: qty 1000

## 2019-07-25 MED ORDER — DIPHENHYDRAMINE HCL 25 MG PO CAPS
ORAL_CAPSULE | ORAL | Status: AC
  Filled 2019-07-25: qty 1

## 2019-07-25 MED ORDER — DIPHENHYDRAMINE HCL 25 MG PO TABS
25.0000 mg | ORAL_TABLET | Freq: Once | ORAL | Status: AC
  Administered 2019-07-25: 25 mg via ORAL
  Filled 2019-07-25: qty 1

## 2019-07-25 MED ORDER — DEXTROSE 5 % IV SOLN
Freq: Once | INTRAVENOUS | Status: AC
  Filled 2019-07-25: qty 250

## 2019-07-25 MED ORDER — ACETAMINOPHEN 325 MG PO TABS
650.0000 mg | ORAL_TABLET | Freq: Once | ORAL | Status: AC
  Administered 2019-07-25: 650 mg via ORAL

## 2019-07-25 MED ORDER — LEVOFLOXACIN 500 MG PO TABS: 500.0000 mg | ORAL_TABLET | Freq: Every day | ORAL | 0 refills | Status: DC

## 2019-07-25 NOTE — Patient Instructions (Signed)
Immune Globulin Injection What is this medicine? IMMUNE GLOBULIN (im MUNE GLOB yoo lin) helps to prevent or reduce the severity of certain infections in patients who are at risk. This medicine is collected from the pooled blood of many donors. It is used to treat immune system problems, thrombocytopenia, and Kawasaki syndrome. This medicine may be used for other purposes; ask your health care provider or pharmacist if you have questions. COMMON BRAND NAME(S): ASCENIV, Baygam, BIVIGAM, Carimune, Carimune NF, cutaquig, Cuvitru, Flebogamma, Flebogamma DIF, GamaSTAN, GamaSTAN S/D, Gamimune N, Gammagard, Gammagard S/D, Gammaked, Gammaplex, Gammar-P IV, Gamunex, Gamunex-C, Hizentra, Iveegam, Iveegam EN, Octagam, Panglobulin, Panglobulin NF, panzyga, Polygam S/D, Privigen, Sandoglobulin, Venoglobulin-S, Vigam, Vivaglobulin, Xembify What should I tell my health care provider before I take this medicine? They need to know if you have any of these conditions:  diabetes  extremely low or no immune antibodies in the blood  heart disease  history of blood clots  hyperprolinemia  infection in the blood, sepsis  kidney disease  recently received or scheduled to receive a vaccination  an unusual or allergic reaction to human immune globulin, albumin, maltose, sucrose, other medicines, foods, dyes, or preservatives  pregnant or trying to get pregnant  breast-feeding How should I use this medicine? This medicine is for injection into a muscle or infusion into a vein or skin. It is usually given by a health care professional in a hospital or clinic setting. In rare cases, some brands of this medicine might be given at home. You will be taught how to give this medicine. Use exactly as directed. Take your medicine at regular intervals. Do not take your medicine more often than directed. Talk to your pediatrician regarding the use of this medicine in children. While this drug may be prescribed for selected  conditions, precautions do apply. Overdosage: If you think you have taken too much of this medicine contact a poison control center or emergency room at once. NOTE: This medicine is only for you. Do not share this medicine with others. What if I miss a dose? It is important not to miss your dose. Call your doctor or health care professional if you are unable to keep an appointment. If you give yourself the medicine and you miss a dose, take it as soon as you can. If it is almost time for your next dose, take only that dose. Do not take double or extra doses. What may interact with this medicine?  aspirin and aspirin-like medicines  cisplatin  cyclosporine  medicines for infection like acyclovir, adefovir, amphotericin B, bacitracin, cidofovir, foscarnet, ganciclovir, gentamicin, pentamidine, vancomycin  NSAIDS, medicines for pain and inflammation, like ibuprofen or naproxen  pamidronate  vaccines  zoledronic acid This list may not describe all possible interactions. Give your health care provider a list of all the medicines, herbs, non-prescription drugs, or dietary supplements you use. Also tell them if you smoke, drink alcohol, or use illegal drugs. Some items may interact with your medicine. What should I watch for while using this medicine? Your condition will be monitored carefully while you are receiving this medicine. This medicine is made from pooled blood donations of many different people. It may be possible to pass an infection in this medicine. However, the donors are screened for infections and all products are tested for HIV and hepatitis. The medicine is treated to kill most or all bacteria and viruses. Talk to your doctor about the risks and benefits of this medicine. Do not have vaccinations for at least   14 days before, or until at least 3 months after receiving this medicine. What side effects may I notice from receiving this medicine? Side effects that you should report  to your doctor or health care professional as soon as possible:  allergic reactions like skin rash, itching or hives, swelling of the face, lips, or tongue  blue colored lips or skin  breathing problems  chest pain or tightness  fever  signs and symptoms of aseptic meningitis such as stiff neck; sensitivity to light; headache; drowsiness; fever; nausea; vomiting; rash  signs and symptoms of a blood clot such as chest pain; shortness of breath; pain, swelling, or warmth in the leg  signs and symptoms of hemolytic anemia such as fast heartbeat; tiredness; dark yellow or brown urine; or yellowing of the eyes or skin  signs and symptoms of kidney injury like trouble passing urine or change in the amount of urine  sudden weight gain  swelling of the ankles, feet, hands Side effects that usually do not require medical attention (report to your doctor or health care professional if they continue or are bothersome):  diarrhea  flushing  headache  increased sweating  joint pain  muscle cramps  muscle pain  nausea  pain, redness, or irritation at site where injected  tiredness This list may not describe all possible side effects. Call your doctor for medical advice about side effects. You may report side effects to FDA at 1-800-FDA-1088. Where should I keep my medicine? Keep out of the reach of children. This drug is usually given in a hospital or clinic and will not be stored at home. In rare cases, some brands of this medicine may be given at home. If you are using this medicine at home, you will be instructed on how to store this medicine. Throw away any unused medicine after the expiration date on the label. NOTE: This sheet is a summary. It may not cover all possible information. If you have questions about this medicine, talk to your doctor, pharmacist, or health care provider.  2020 Elsevier/Gold Standard (2018-07-26 12:51:14)  

## 2019-07-25 NOTE — Progress Notes (Signed)
Upon patient's initial assessment to infusion room, it was found that patient had a fever of 103.2. Dr. Pamelia Hoit made aware, and asked if Marga Hoots, PA could see patient in infusion room.  Marga Hoots, PA notified and assessed patient in infusion room.  Per Dr. Pamelia Hoit, patient to receive IVIG only today. Ok given to proceed with treatment if Covid test is negative.   Rapid covid screening negative. Per Marga Hoots, PA ok to proceed with IVIG treatment. Patient to receive tylenol as one of her premeds.

## 2019-07-26 NOTE — Progress Notes (Signed)
These preliminary result these preliminary results were noted.  Awaiting final report.

## 2019-07-27 LAB — URINE CULTURE: Culture: 20000 — AB

## 2019-07-27 NOTE — Progress Notes (Signed)
These preliminary result these preliminary results were noted.  Awaiting final report.

## 2019-07-27 NOTE — Progress Notes (Signed)
[facility] Progress Note   [name] [id number] 04/02/1965 54 y.o.  [name] is managed by Dr. Serena Croissant  Actively treated with chemotherapy/immunotherapy/hormonal therapy: no  Next scheduled appointment with provider: 08/01/2019  Assessment: Plan:    Fever and chills - Plan: CBC with Differential (Cancer Center Only), CMP (Cancer Center only), Culture, Blood, Culture, Blood, Resp Panel by RT PCR (RSV, Flu A&B, Covid) - Nasopharyngeal Swab, Resp Panel by RT PCR (RSV, Flu A&B, Covid) - Nasopharyngeal Swab, levofloxacin (LEVAQUIN) 500 MG tablet, CANCELED: Urine Culture, CANCELED: Urinalysis, Complete w Microscopic  Nasal discharge - Plan: Resp Panel by RT PCR (RSV, Flu A&B, Covid) - Nasopharyngeal Swab, Resp Panel by RT PCR (RSV, Flu A&B, Covid) - Nasopharyngeal Swab  Sneezing - Plan: Resp Panel by RT PCR (RSV, Flu A&B, Covid) - Nasopharyngeal Swab, Resp Panel by RT PCR (RSV, Flu A&B, Covid) - Nasopharyngeal Swab  History of ITP  Thrombocytopenia (HCC)   Fevers, chills, and sneezing.  A CBC, blood cultures x2, urine, and urine culture were collected today.  Additionally, a stat respiratory panel was collected which returned with negative results.  Relapsed ITP: The patient continues to be managed by Dr. Serena Croissant and is receiving IVIG today.  Please see After Visit Summary for patient specific instructions.  Future Appointments  Date Time Provider Department Center  08/01/2019 10:00 AM [facility] LAB 5 [facility] None  08/01/2019 10:30 AM Serena Croissant, MD [facility] None  08/01/2019 11:30 AM [facility] INFUSION [facility] None    Orders Placed This Encounter  Procedures  . Culture, Blood  . Culture, Blood  . Resp Panel by RT PCR (RSV, Flu A&B, Covid) - Nasopharyngeal Swab  . Urine Culture  . CBC with Differential (Cancer Center Only)  . CMP (Cancer Center only)  . Urinalysis, Complete w Microscopic       Subjective:    Patient ID:  [name] is a 54 y.o. (DOB 04/11/1965) female.  Chief Complaint: No chief complaint on file.   HPI [name]  is a 54 y.o. female with a diagnosis of a relapsed ITP.  She is managed by Dr. Serena Croissant was seen in the infusion room today as she was receiving IVIG.  The patient was noted to have a fever of 103.2 and was tachycardic at 105.  Her review of systems was negative except for chills and sneezing.  She denied a cough, dysuria, or cloudy urine.   Medications: I have reviewed the patient's current medications.  Allergies: No Known Allergies  Past Medical History:  Diagnosis Date  . Acute exacerbation of CHF (congestive heart failure) (HCC) 04/11/2019  . Anemia   . Arthritis    "spine, hands" (11/25/2015)  . Back pain    "all my back; probably 3 times/week" (11/25/2015)  . Chronic indwelling Foley catheter 03/04/2017  . Diabetic foot ulcer associated with type 2 diabetes mellitus (HCC)   . Hypertension   . Intracerebral hemorrhage (HCC) As a teenager    States she had burst blood vessel as teenager, now with resultant minor visual field and hearing deficits  . Neuropathy   . Stroke [facility]) ~ 1982   "my feeling on my RLE, right lower eye vision,  & hearing out of my right ear not the same since" (11/25/2015)  . Type II diabetes mellitus (HCC)     Past Surgical History:  Procedure Laterality Date  . AMPUTATION  11/24/2010   Procedure: AMPUTATION FOOT;  Surgeon: [name],  MD;  Location: [facility] OR;  Service: Orthopedics;  Laterality: Right;  Right  FOOT TRANS METATARSAL AMPUTATION  . AMPUTATION  07/02/2011   Procedure: AMPUTATION BELOW KNEE;  Surgeon: [name], MD;  Location: [facility] OR;  Service: Orthopedics;  Laterality: Right;  . APPLICATION OF WOUND VAC  07/02/2011   Procedure: APPLICATION OF WOUND VAC;  Surgeon: [name], MD;  Location: [facility] OR;  Service: Orthopedics;  Laterality: Right;  . BRAIN SURGERY  1980's   Previous brain surgery in  [place], [place]  . CESAREAN SECTION  2004; 2006  . COLONOSCOPY N/A 09/17/2015   Procedure: COLONOSCOPY;  Surgeon: Charlott Rakes, MD;  Location: [facility] ENDOSCOPY;  Service: Endoscopy;  Laterality: N/A;  . ESOPHAGOGASTRODUODENOSCOPY N/A 09/17/2015   Procedure: ESOPHAGOGASTRODUODENOSCOPY (EGD);  Surgeon: Charlott Rakes, MD;  Location: [facility] ENDOSCOPY;  Service: Endoscopy;  Laterality: N/A;  . I & D EXTREMITY  11/24/2010   Procedure: IRRIGATION AND DEBRIDEMENT EXTREMITY;  Surgeon: [name], MD;  Location: [facility] OR;  Service: Orthopedics;  Laterality: Left;  Debriedment of left plantar ulcer and trimming of toenails  . I & D EXTREMITY  07/02/2011   Procedure: IRRIGATION AND DEBRIDEMENT EXTREMITY;  Surgeon: [name], MD;  Location: [facility] OR;  Service: Orthopedics;  Laterality: Left;  I & D of Left Foot  . I & D EXTREMITY  07/06/2011   Procedure: IRRIGATION AND DEBRIDEMENT EXTREMITY;  Surgeon: [name], MD;  Location: [facility] OR;  Service: Orthopedics;  Laterality: Right;  IRRIGATION/DEBRIDEMENT RIGHT BELOW KNEE AMPUTATION  . TUBAL LIGATION  2006    Family History  Problem Relation Age of Onset  . Diabetes Mother   . Hypertension Mother   . Hyperlipidemia Mother   . Diabetes Father   . Cancer Father        prostate  . Hyperlipidemia Father   . Hypertension Father   . Diabetes Brother   . Peripheral Artery Disease Brother        has had toes amputated  . Diabetes Son     Social History   Socioeconomic History  . Marital status: Widowed    Spouse name: Not on file  . Number of children: Not on file  . Years of education: Not on file  . Highest education level: Not on file  Occupational History  . Not on file  Tobacco Use  . Smoking status: Never Smoker  . Smokeless tobacco: Never Used  Vaping Use  . Vaping Use: Never used  Substance and Sexual Activity  . Alcohol use: Yes    Alcohol/week: 2.0 standard drinks    Types: 2 Glasses of wine per week  . Drug use: No  . Sexual activity: Not  Currently    Birth control/protection: Post-menopausal  Other Topics Concern  . Not on file  Social History Narrative   Widowed in 2012   Lives in a home she owns   No wheelchair ramp, though garage entry is flush to both garage and entry floor.   Unable to use full bathroom upstairs.   Has prosthesis for right LE, but never learned how to use.   Cannot see well due to cataracts.   Did not receive disability in 09/2017 with application.   Possibly due to husband's SS she receives   Social Determinants of Corporate investment banker Strain:   . Difficulty of Paying Living Expenses:   Food Insecurity:   . Worried About Programme researcher, broadcasting/film/video in the Last Year:   . [employer]  in the Last Year:   Transportation Needs:   . Freight forwarder (Medical):   Marland Kitchen Lack of Transportation (Non-Medical):   Physical Activity:   . Days of Exercise per Week:   . Minutes of Exercise per Session:   Stress:   . Feeling of Stress :   Social Connections:   . Frequency of Communication with Friends and Family:   . Frequency of Social Gatherings with Friends and Family:   . Attends Religious Services:   . Active Member of Clubs or Organizations:   . Attends Banker Meetings:   Marland Kitchen Marital Status:   Intimate Partner Violence:   . Fear of Current or Ex-Partner:   . Emotionally Abused:   Marland Kitchen Physically Abused:   . Sexually Abused:     Past Medical History, Surgical history, Social history, and Family history were reviewed and updated as appropriate.   Please see review of systems for further details on the patient's review from today.   Review of Systems:  Review of Systems  Constitutional: Positive for chills and fever. Negative for diaphoresis and fatigue.  HENT: Positive for sneezing. Negative for congestion, postnasal drip, rhinorrhea and sore throat.   Respiratory: Negative for cough, shortness of breath and wheezing.   Cardiovascular: Negative for palpitations.    Genitourinary: Negative for dysuria, frequency and urgency.  Neurological: Negative for headaches.    Objective:   Physical Exam:  LMP  (LMP Unknown)  ECOG: 1  Physical Exam Constitutional:      General: She is not in acute distress.    Appearance: She is not diaphoretic.  HENT:     Head: Normocephalic and atraumatic.     Mouth/Throat:     Pharynx: No oropharyngeal exudate or posterior oropharyngeal erythema.  Eyes:     General: No scleral icterus.       Right eye: No discharge.        Left eye: No discharge.     Conjunctiva/sclera: Conjunctivae normal.  Cardiovascular:     Rate and Rhythm: Regular rhythm. Tachycardia present.     Heart sounds: Normal heart sounds. No murmur heard.  No friction rub. No gallop.   Pulmonary:     Effort: Pulmonary effort is normal. No respiratory distress.     Breath sounds: Normal breath sounds. No wheezing or rales.  Musculoskeletal:     Cervical back: Normal range of motion and neck supple.  Lymphadenopathy:     Cervical: No cervical adenopathy.  Skin:    General: Skin is warm and dry.     Findings: No erythema or rash.  Neurological:     Mental Status: She is alert.  Psychiatric:        Behavior: Behavior normal.        Thought Content: Thought content normal.        Judgment: Judgment normal.     Lab Review:     Component Value Date/Time   NA 134 (L) 07/25/2019 1045   NA 139 05/16/2019 1522   NA 138 09/30/2015 1118   K 4.4 07/25/2019 1045   K 4.6 09/30/2015 1118   CL 105 07/25/2019 1045   CO2 20 (L) 07/25/2019 1045   CO2 19 (L) 09/30/2015 1118   GLUCOSE 198 (H) 07/25/2019 1045   GLUCOSE 200 (H) 09/30/2015 1118   BUN 26 (H) 07/25/2019 1045   BUN 23 05/16/2019 1522   BUN 22.3 09/30/2015 1118   CREATININE 1.74 (H) 07/25/2019 1045   CREATININE 1.3 (H) 09/30/2015  1118   CALCIUM 8.5 (L) 07/25/2019 1045   CALCIUM 9.6 09/30/2015 1118   PROT 8.4 (H) 07/25/2019 1045   PROT 8.2 05/16/2019 1522   ALBUMIN 2.8 (L) 07/25/2019  1045   ALBUMIN 4.2 05/16/2019 1522   AST 22 07/25/2019 1045   ALT 13 07/25/2019 1045   ALKPHOS 63 07/25/2019 1045   BILITOT 0.8 07/25/2019 1045   GFRNONAA 33 (L) 07/25/2019 1045   GFRAA 38 (L) 07/25/2019 1045       Component Value Date/Time   WBC 9.8 07/25/2019 1045   WBC 3.2 (L) 06/29/2019 0612   RBC 3.74 (L) 07/25/2019 1045   HGB 10.3 (L) 07/25/2019 1045   HGB 12.2 05/16/2019 1522   HGB 10.3 (L) 09/30/2015 1118   HCT 33.6 (L) 07/25/2019 1045   HCT 38.1 05/16/2019 1522   HCT 31.6 (L) 09/30/2015 1118   PLT 62 (L) 07/25/2019 1045   PLT 16 (LL) 05/16/2019 1522   MCV 89.8 07/25/2019 1045   MCV 83 05/16/2019 1522   MCV 84.3 09/30/2015 1118   MCH 27.5 07/25/2019 1045   MCHC 30.7 07/25/2019 1045   RDW 18.3 (H) 07/25/2019 1045   RDW 16.0 (H) 05/16/2019 1522   RDW 15.1 (H) 09/30/2015 1118   LYMPHSABS 1.0 07/25/2019 1045   LYMPHSABS 1.2 05/16/2019 1522   LYMPHSABS 1.5 09/30/2015 1118   MONOABS 1.4 (H) 07/25/2019 1045   MONOABS 0.3 09/30/2015 1118   EOSABS 0.0 07/25/2019 1045   EOSABS 0.1 05/16/2019 1522   BASOSABS 0.0 07/25/2019 1045   BASOSABS 0.0 05/16/2019 1522   BASOSABS 0.0 09/30/2015 1118   -------------------------------  Imaging from last 24 hours (if applicable):  Radiology interpretation: No results found.      This case was discussed with Dr. Pamelia HoitGudena. He expresses agreement with my management of this patient.

## 2019-07-30 ENCOUNTER — Other Ambulatory Visit: Payer: Self-pay

## 2019-07-30 ENCOUNTER — Emergency Department (HOSPITAL_COMMUNITY): Payer: Medicare Other

## 2019-07-30 ENCOUNTER — Emergency Department (HOSPITAL_COMMUNITY)
Admission: EM | Admit: 2019-07-30 | Discharge: 2019-07-31 | Disposition: A | Discharge status: Home / Self Care | Payer: Medicare Other | Source: Home / Self Care | Attending: Emergency Medicine | Admitting: Emergency Medicine

## 2019-07-30 ENCOUNTER — Encounter (HOSPITAL_COMMUNITY): Payer: Self-pay | Admitting: Radiology

## 2019-07-30 DIAGNOSIS — N183 Chronic kidney disease, stage 3 unspecified: Secondary | ICD-10-CM | POA: Insufficient documentation

## 2019-07-30 DIAGNOSIS — R05 Cough: Secondary | ICD-10-CM | POA: Insufficient documentation

## 2019-07-30 DIAGNOSIS — R509 Fever, unspecified: Secondary | ICD-10-CM | POA: Insufficient documentation

## 2019-07-30 DIAGNOSIS — I130 Hypertensive heart and chronic kidney disease with heart failure and stage 1 through stage 4 chronic kidney disease, or unspecified chronic kidney disease: Secondary | ICD-10-CM | POA: Insufficient documentation

## 2019-07-30 DIAGNOSIS — H9209 Otalgia, unspecified ear: Secondary | ICD-10-CM | POA: Insufficient documentation

## 2019-07-30 DIAGNOSIS — I5031 Acute diastolic (congestive) heart failure: Secondary | ICD-10-CM | POA: Insufficient documentation

## 2019-07-30 DIAGNOSIS — R519 Headache, unspecified: Secondary | ICD-10-CM | POA: Insufficient documentation

## 2019-07-30 DIAGNOSIS — E1122 Type 2 diabetes mellitus with diabetic chronic kidney disease: Secondary | ICD-10-CM | POA: Insufficient documentation

## 2019-07-30 DIAGNOSIS — M79672 Pain in left foot: Secondary | ICD-10-CM | POA: Diagnosis not present

## 2019-07-30 DIAGNOSIS — Z20822 Contact with and (suspected) exposure to covid-19: Secondary | ICD-10-CM | POA: Insufficient documentation

## 2019-07-30 DIAGNOSIS — E1140 Type 2 diabetes mellitus with diabetic neuropathy, unspecified: Secondary | ICD-10-CM | POA: Insufficient documentation

## 2019-07-30 DIAGNOSIS — L97509 Non-pressure chronic ulcer of other part of unspecified foot with unspecified severity: Secondary | ICD-10-CM | POA: Insufficient documentation

## 2019-07-30 DIAGNOSIS — L03116 Cellulitis of left lower limb: Secondary | ICD-10-CM | POA: Diagnosis not present

## 2019-07-30 DIAGNOSIS — E11621 Type 2 diabetes mellitus with foot ulcer: Secondary | ICD-10-CM | POA: Insufficient documentation

## 2019-07-30 LAB — CBC WITH DIFFERENTIAL/PLATELET
Abs Immature Granulocytes: 0.08 10*3/uL — ABNORMAL HIGH (ref 0.00–0.07)
Basophils Absolute: 0 10*3/uL (ref 0.0–0.1)
Basophils Relative: 0 %
Eosinophils Absolute: 0 10*3/uL (ref 0.0–0.5)
Eosinophils Relative: 0 %
HCT: 33.3 % — ABNORMAL LOW (ref 36.0–46.0)
Hemoglobin: 10.1 g/dL — ABNORMAL LOW (ref 12.0–15.0)
Immature Granulocytes: 1 %
Lymphocytes Relative: 5 %
Lymphs Abs: 0.6 10*3/uL — ABNORMAL LOW (ref 0.7–4.0)
MCH: 27.7 pg (ref 26.0–34.0)
MCHC: 30.3 g/dL (ref 30.0–36.0)
MCV: 91.2 fL (ref 80.0–100.0)
Monocytes Absolute: 2.1 10*3/uL — ABNORMAL HIGH (ref 0.1–1.0)
Monocytes Relative: 16 %
Neutro Abs: 10 10*3/uL — ABNORMAL HIGH (ref 1.7–7.7)
Neutrophils Relative %: 78 %
Platelets: 380 10*3/uL (ref 150–400)
RBC: 3.65 MIL/uL — ABNORMAL LOW (ref 3.87–5.11)
RDW: 19.1 % — ABNORMAL HIGH (ref 11.5–15.5)
WBC: 12.7 10*3/uL — ABNORMAL HIGH (ref 4.0–10.5)
nRBC: 0 % (ref 0.0–0.2)

## 2019-07-30 LAB — COMPREHENSIVE METABOLIC PANEL
ALT: 13 U/L (ref 0–44)
AST: 31 U/L (ref 15–41)
Albumin: 2.9 g/dL — ABNORMAL LOW (ref 3.5–5.0)
Alkaline Phosphatase: 59 U/L (ref 38–126)
Anion gap: 10 (ref 5–15)
BUN: 55 mg/dL — ABNORMAL HIGH (ref 6–20)
CO2: 22 mmol/L (ref 22–32)
Calcium: 8.4 mg/dL — ABNORMAL LOW (ref 8.9–10.3)
Chloride: 101 mmol/L (ref 98–111)
Creatinine, Ser: 1.82 mg/dL — ABNORMAL HIGH (ref 0.44–1.00)
GFR calc Af Amer: 36 mL/min — ABNORMAL LOW (ref >60)
GFR calc non Af Amer: 31 mL/min — ABNORMAL LOW (ref >60)
Glucose, Bld: 128 mg/dL — ABNORMAL HIGH (ref 70–99)
Potassium: 5.2 mmol/L — ABNORMAL HIGH (ref 3.5–5.1)
Sodium: 133 mmol/L — ABNORMAL LOW (ref 135–145)
Total Bilirubin: 1.2 mg/dL (ref 0.3–1.2)
Total Protein: 8.8 g/dL — ABNORMAL HIGH (ref 6.5–8.1)

## 2019-07-30 LAB — URINALYSIS, ROUTINE W REFLEX MICROSCOPIC
Bilirubin Urine: NEGATIVE
Glucose, UA: NEGATIVE mg/dL
Hgb urine dipstick: NEGATIVE
Ketones, ur: NEGATIVE mg/dL
Leukocytes,Ua: NEGATIVE
Nitrite: NEGATIVE
Protein, ur: 100 mg/dL — AB
Specific Gravity, Urine: 1.019 (ref 1.005–1.030)
pH: 5 (ref 5.0–8.0)

## 2019-07-30 LAB — RESPIRATORY PANEL BY RT PCR (FLU A&B, COVID)
Influenza A by PCR: NEGATIVE
Influenza B by PCR: NEGATIVE
SARS Coronavirus 2 by RT PCR: NEGATIVE

## 2019-07-30 LAB — PROTIME-INR
INR: 1.3 — ABNORMAL HIGH (ref 0.8–1.2)
Prothrombin Time: 15.2 seconds (ref 11.4–15.2)

## 2019-07-30 LAB — CULTURE, BLOOD (SINGLE)
Culture: NO GROWTH
Culture: NO GROWTH

## 2019-07-30 LAB — LACTIC ACID, PLASMA: Lactic Acid, Venous: 1.7 mmol/L (ref 0.5–1.9)

## 2019-07-30 LAB — SEDIMENTATION RATE: Sed Rate: 117 mm/hr — ABNORMAL HIGH (ref 0–22)

## 2019-07-30 LAB — TYPE AND SCREEN
ABO/RH(D): B POS
Antibody Screen: NEGATIVE

## 2019-07-30 LAB — CBG MONITORING, ED: Glucose-Capillary: 145 mg/dL — ABNORMAL HIGH (ref 70–99)

## 2019-07-30 MED ORDER — ACETAMINOPHEN 325 MG PO TABS
650.0000 mg | ORAL_TABLET | Freq: Once | ORAL | Status: AC
  Administered 2019-07-30: 650 mg via ORAL
  Filled 2019-07-30: qty 2

## 2019-07-30 MED ORDER — SODIUM CHLORIDE 0.9 % IV BOLUS
500.0000 mL | Freq: Once | INTRAVENOUS | Status: AC
  Administered 2019-07-30: 500 mL via INTRAVENOUS

## 2019-07-30 MED ORDER — DEXAMETHASONE SODIUM PHOSPHATE 4 MG/ML IJ SOLN
4.0000 mg | Freq: Once | INTRAMUSCULAR | Status: AC
  Administered 2019-07-30: 4 mg via INTRAVENOUS
  Filled 2019-07-30: qty 1

## 2019-07-30 MED ORDER — FENTANYL CITRATE (PF) 100 MCG/2ML IJ SOLN
50.0000 ug | Freq: Once | INTRAMUSCULAR | Status: AC
  Administered 2019-07-30: 50 ug via INTRAVENOUS
  Filled 2019-07-30: qty 2

## 2019-07-30 NOTE — ED Triage Notes (Signed)
Fever noted when obtaining triage vital signs. Pt states had one since last Wed.  Taking tylenol for fever last dose 2pm today.

## 2019-07-30 NOTE — ED Provider Notes (Signed)
[facility] DEPT Provider Note   CSN: [id number] Arrival date & time: 07/30/19  1605     History Chief Complaint  Patient presents with  . Headache  . Fever    [name] is a 54 y.o. female.  She has a history of ITP and gets IVIG infusions.  She was in the clinic 5 days ago for her last infusion and was found to be febrile and having sinus symptoms.  She was put on Levaquin.  Continues to have runny nose and sharp stabbing right head and face pain.  She said she has had this before but it never lasted this long.  Does not know she is continue to have fever.  Minimal cough.  No chest pain abdominal pain nausea vomiting diarrhea or urinary symptoms.  The history is provided by the patient.  Headache Pain location:  R temporal and frontal Quality:  Sharp Radiates to:  Does not radiate Severity currently:  10/10 Severity at highest:  10/10 Onset quality:  Gradual Duration:  5 days Timing:  Intermittent Progression:  Worsening Chronicity:  New Relieved by:  Nothing Worsened by:  Nothing Ineffective treatments:  Acetaminophen Associated symptoms: cough, ear pain, facial pain and fever   Associated symptoms: no abdominal pain, no blurred vision, no diarrhea, no eye pain, no nausea, no neck stiffness, no photophobia, no sore throat and no vomiting   Fever Associated symptoms: cough, ear pain and headaches   Associated symptoms: no chest pain, no diarrhea, no dysuria, no nausea, no rash, no sore throat and no vomiting        Past Medical History:  Diagnosis Date  . Acute exacerbation of CHF (congestive heart failure) ([place]) 04/11/2019  . Anemia   . Arthritis    "spine, hands" (11/25/2015)  . Back pain    "all my back; probably 3 times/week" (11/25/2015)  . Chronic indwelling Foley catheter 03/04/2017  . Diabetic foot ulcer associated with type 2 diabetes mellitus ([place])   . Hypertension   . Intracerebral hemorrhage ([place]) As a teenager     States she had burst blood vessel as teenager, now with resultant minor visual field and hearing deficits  . Neuropathy   . Stroke [facility]) ~ 1982   "my feeling on my RLE, right lower eye vision,  & hearing out of my right ear not the same since" (11/25/2015)  . Type II diabetes mellitus [facility])     Patient Active Problem List   Diagnosis Date Noted  . Nausea and vomiting 06/26/2019  . Hematuria 06/26/2019  . Hypertensive urgency 06/26/2019  . Acute diastolic CHF (congestive heart failure) ([place])   . CHF (congestive heart failure) ([place]) 04/11/2019  . Acute exacerbation of CHF (congestive heart failure) ([place]) 04/11/2019  . Leukopenia 04/11/2019  . History of ITP 04/11/2019  . Idiopathic thrombocytopenic purpura (ITP) (HCC) 11/16/2018  . Onychomycosis of toenail 10/16/2018  . Cataract of both eyes 10/16/2018  . Tinea pedis of left foot 10/16/2018  . Decreased vision in both eyes 10/16/2018  . CKD (chronic kidney disease) stage 3, GFR 30-59 ml/min   . Cellulitis of left lower extremity 01/25/2018  . Hyperkalemia 01/25/2018  . Poor compliance 03/08/2017  . Hypoxia 03/07/2017  . Thyroid nodule 03/07/2017  . Chronic indwelling Foley catheter 03/04/2017  . Type II diabetes mellitus ([place])   . Femur fracture ([place]) 03/02/2017  . Orthostatic dizziness   . Urinary tract infection without hematuria   . Enterococcus faecalis infection   .  Near syncope 11/25/2015  . Dehydration 11/25/2015  . Orthostatic hypotension 11/25/2015  . UTI (urinary tract infection) 11/25/2015  . Acute worsening of stage 3 chronic kidney disease 09/24/2015  . Diabetes mellitus due to underlying condition with chronic kidney disease, without long-term current use of insulin ([place])   . Fever   . Status post below knee amputation of right lower extremity ([place])   . Diabetic peripheral neuropathy ([place])   . Neuropathic pain   . Benign essential HTN   . Folliculitis   . Slow transit constipation   . Anemia of chronic  disease   . Bilateral hydronephrosis   . Urinary retention   . CKD (chronic kidney disease)   . Uncontrolled type 2 diabetes mellitus with diabetic nephropathy, without long-term current use of insulin ([place])   . Vasculopathy   . Debility 09/04/2015  . DM type 2 with diabetic peripheral neuropathy ([place])   . S/P BKA (below knee amputation) unilateral ([place])   . Medical non-compliance   . Benign skin lesion of multiple sites   . Tachypnea   . Acute blood loss anemia   . Neurogenic bladder   . Occult blood positive stool   . Rectovaginal fistula   . Controlled diabetes mellitus type 2 with complications ([place])   . Acute on chronic renal failure ([place])   . Hydronephrosis determined by ultrasound   . Papular rash   . Uncontrolled type 2 diabetes mellitus with complication ([place])   . Paresthesia   . Thrombocytopenia ([place]) 08/23/2015  . Pressure ulcer 08/23/2015  . Severe anemia 08/23/2015  . Lower urinary tract infectious disease 08/23/2015  . Absolute anemia 08/23/2015  . Peripheral vascular disease, unspecified ([place]) 04/27/2012  . Unilateral complete BKA ([place]) 07/09/2011  . Gas gangrene ([place]) 07/02/2011  . Sepsis(995.91) 07/02/2011  . Acute kidney injury superimposed on chronic kidney disease ([place]) 07/02/2011  . Diabetes mellitus ([place]) 11/22/2010  . Diabetic foot ulcer associated with type 2 diabetes mellitus ([place])   . Essential hypertension   . Arthritis   . Neuropathy ([place])   . Intracerebral hemorrhage [facility])     Past Surgical History:  Procedure Laterality Date  . AMPUTATION  11/24/2010   Procedure: AMPUTATION FOOT;  Surgeon: Wylene Simmer, MD;  Location: [place];  Service: Orthopedics;  Laterality: Right;  Right  FOOT TRANS METATARSAL AMPUTATION  . AMPUTATION  07/02/2011   Procedure: AMPUTATION BELOW KNEE;  Surgeon: Wylene Simmer, MD;  Location: [place];  Service: Orthopedics;  Laterality: Right;  . APPLICATION OF WOUND VAC  07/02/2011   Procedure: APPLICATION OF WOUND VAC;  Surgeon:  Wylene Simmer, MD;  Location: [place];  Service: Orthopedics;  Laterality: Right;  . BRAIN SURGERY  1980's   Previous brain surgery in [place], [place]  . CESAREAN SECTION  2004; 2006  . COLONOSCOPY N/A 09/17/2015   Procedure: COLONOSCOPY;  Surgeon: Wilford Corner, MD;  [facility] END[facility]COPY;  Service: Endoscopy;  Laterality: N/A;  . ESOPHAGOGASTRODUODENOSCOPY N/A 09/17/2015   Procedure: ESOPHAGOGASTRODUODENOSCOPY (EGD);  Surgeon: Wilford Corner, MD;  Location: [facility] ENDOSCOPY;  Service: Endoscopy;  Laterality: N/A;  . I & D EXTREMITY  11/24/2010   Procedure: IRRIGATION AND DEBRIDEMENT EXTREMITY;  Surgeon: Wylene Simmer, MD;  Location: [place];  Service: Orthopedics;  Laterality: Left;  Debriedment of left plantar ulcer and trimming of toenails  . I & D EXTREMITY  07/02/2011   Procedure: IRRIGATION AND DEBRIDEMENT EXTREMITY;  Surgeon: Wylene Simmer, MD;  Location: [place];  Service: Orthopedics;  Laterality: Left;  I & D of Left Foot  . I & D EXTREMITY  07/06/2011   Procedure: IRRIGATION AND DEBRIDEMENT EXTREMITY;  Surgeon: Wylene Simmer, MD;  Location: [place];  Service: Orthopedics;  Laterality: Right;  IRRIGATION/DEBRIDEMENT RIGHT BELOW KNEE AMPUTATION  . TUBAL LIGATION  2006     OB History   No obstetric history on file.     Family History  Problem Relation Age of Onset  . Diabetes Mother   . Hypertension Mother   . Hyperlipidemia Mother   . Diabetes Father   . Cancer Father        prostate  . Hyperlipidemia Father   . Hypertension Father   . Diabetes Brother   . Peripheral Artery Disease Brother        has had toes amputated  . Diabetes Son     Social History   Tobacco Use  . Smoking status: Never Smoker  . Smokeless tobacco: Never Used  Vaping Use  . Vaping Use: Never used  Substance Use Topics  . Alcohol use: Yes    Alcohol/week: 2.0 standard drinks    Types: 2 Glasses of wine per week  . Drug use: No    Home Medications Prior to Admission medications   Medication Sig Start Date  End Date Taking? Authorizing Provider  acetaminophen (TYLENOL) 500 MG tablet Take 1,000 mg by mouth every 6 (six) hours as needed for mild pain.    [provider]  amLODipine (NORVASC) 5 MG tablet Take 1 tablet (5 mg total) by mouth daily. 06/29/19 07/29/19  Darliss Cheney, MD  Blood Glucose Monitoring Suppl (PRODIGY AUTOCODE BLOOD GLUCOSE) w/Device KIT 1 kit by Does not apply route in the morning, at noon, in the evening, and at bedtime. 06/06/19 06/05/20  Vevelyn Francois, NP  furosemide (LASIX) 40 MG tablet Take 1 tablet (40 mg total) by mouth 2 (two) times daily. 04/19/19   Florencia Reasons, MD  gabapentin (NEURONTIN) 400 MG capsule Take 1 capsule (400 mg total) by mouth 2 (two) times daily. 04/19/19   Florencia Reasons, MD  isosorbide mononitrate (IMDUR) 30 MG 24 hr tablet Take 1 tablet (30 mg total) by mouth daily. 06/18/19 12/15/19  Vevelyn Francois, NP  levofloxacin (LEVAQUIN) 500 MG tablet Take 1 tablet (500 mg total) by mouth daily. 07/25/19   Tanner, Lyndon Code., PA-C  metFORMIN (GLUCOPHAGE) 500 MG tablet Take 1 tablet (500 mg total) by mouth 2 (two) times daily with a meal. Patient taking differently: Take 500 mg by mouth daily with breakfast.  11/13/18 06/26/19  Nita Sells, MD  metoprolol succinate (TOPROL-XL) 50 MG 24 hr tablet Take 1 tablet (50 mg total) by mouth daily. Take with or immediately following a meal. 06/18/19 12/15/19  Vevelyn Francois, NP  ondansetron (ZOFRAN ODT) 4 MG disintegrating tablet Take 1 tablet (4 mg total) by mouth every 8 (eight) hours as needed for nausea or vomiting. 06/26/19   Horton, Barbette Hair, MD  ondansetron (ZOFRAN-ODT) 4 MG disintegrating tablet Take 1 tablet (4 mg total) by mouth every 8 (eight) hours as needed for nausea or vomiting. 06/29/19   Darliss Cheney, MD  rosuvastatin (CRESTOR) 20 MG tablet Take 1 tablet (20 mg total) by mouth daily. 06/18/19 12/15/19  Vevelyn Francois, NP    Allergies    Patient has no known allergies.  Review of Systems   Review of Systems    Constitutional: Positive for fever.  HENT: Positive for ear pain. Negative for sore throat.   Eyes:  Negative for blurred vision, photophobia and pain.  Respiratory: Positive for cough. Negative for shortness of breath.   Cardiovascular: Negative for chest pain.  Gastrointestinal: Negative for abdominal pain, diarrhea, nausea and vomiting.  Genitourinary: Negative for dysuria.  Musculoskeletal: Negative for neck stiffness.  Skin: Negative for rash.  Neurological: Positive for headaches.    Physical Exam Updated Vital Signs BP 121/65   Pulse 87   Temp (!) 101.1 F (38.4 C) (Oral)   Resp 18   LMP  (LMP Unknown)   SpO2 96%   Physical Exam Vitals and nursing note reviewed.  Constitutional:      General: She is not in acute distress.    Appearance: She is well-developed.  HENT:     Head: Normocephalic and atraumatic.  Eyes:     Conjunctiva/sclera: Conjunctivae normal.  Cardiovascular:     Rate and Rhythm: Normal rate and regular rhythm.     Heart sounds: No murmur heard.   Pulmonary:     Effort: Pulmonary effort is normal. No respiratory distress.     Breath sounds: Normal breath sounds.  Abdominal:     Palpations: Abdomen is soft.     Tenderness: There is no abdominal tenderness.  Musculoskeletal:        General: Normal range of motion.     Cervical back: Neck supple.     Comments: Right BKA  Skin:    General: Skin is warm and dry.  Neurological:     Mental Status: She is alert and oriented to person, place, and time.     GCS: GCS eye subscore is 4. GCS verbal subscore is 5. GCS motor subscore is 6.     Cranial Nerves: No cranial nerve deficit.     ED Results / Procedures / Treatments   Labs (all labs ordered are listed, but only abnormal results are displayed) Labs Reviewed  COMPREHENSIVE METABOLIC PANEL - Abnormal; Notable for the following components:      Result Value   Sodium 133 (*)    Potassium 5.2 (*)    Glucose, Bld 128 (*)    BUN 55 (*)     Creatinine, Ser 1.82 (*)    Calcium 8.4 (*)    Total Protein 8.8 (*)    Albumin 2.9 (*)    GFR calc non Af Amer 31 (*)    GFR calc Af Amer 36 (*)    All other components within normal limits  CBC WITH DIFFERENTIAL/PLATELET - Abnormal; Notable for the following components:   WBC 12.7 (*)    RBC 3.65 (*)    Hemoglobin 10.1 (*)    HCT 33.3 (*)    RDW 19.1 (*)    Neutro Abs 10.0 (*)    Lymphs Abs 0.6 (*)    Monocytes Absolute 2.1 (*)    Abs Immature Granulocytes 0.08 (*)    All other components within normal limits  PROTIME-INR - Abnormal; Notable for the following components:   INR 1.3 (*)    All other components within normal limits  SEDIMENTATION RATE - Abnormal; Notable for the following components:   Sed Rate 117 (*)    All other components within normal limits  URINALYSIS, ROUTINE W REFLEX MICROSCOPIC - Abnormal; Notable for the following components:   Protein, ur 100 (*)    Bacteria, UA RARE (*)    All other components within normal limits  CBG MONITORING, ED - Abnormal; Notable for the following components:   Glucose-Capillary 145 (*)    All  other components within normal limits  RESPIRATORY PANEL BY RT PCR (FLU A&B, COVID)  CULTURE, BLOOD (ROUTINE X 2)  CULTURE, BLOOD (ROUTINE X 2)  URINE CULTURE  LACTIC ACID, PLASMA  TYPE AND SCREEN    EKG EKG Interpretation  Date/Time:  Monday July 30 2019 17:29:13 EDT Ventricular Rate:  93 PR Interval:    QRS Duration: 79 QT Interval:  367 QTC Calculation: 457 R Axis:   35 Text Interpretation: Sinus rhythm Consider right atrial enlargement Borderline T abnormalities, anterior leads No significant change since prior 6.21 Confirmed by Aletta Edouard [phone]) on 07/30/2019 5:33:02 PM   Radiology CT Head Wo Contrast  Result Date: 07/30/2019 CLINICAL DATA:  Headache. Right-sided head pain since Wednesday. Fever. EXAM: CT HEAD WITHOUT CONTRAST TECHNIQUE: Contiguous axial images were obtained from the base of the skull through  the vertex without intravenous contrast. COMPARISON:  11/05/2018 FINDINGS: Brain: Encephalomalacia again identified within left parietal and temporal regions. Significantly age advanced cerebral and cerebellar atrophy. No mass lesion, hemorrhage, hydrocephalus, acute infarct, intra-axial, or extra-axial fluid collection. Vascular: No hyperdense vessel or unexpected calcification. Skull: Left frontal parietal craniotomy. Sinuses/Orbits: Normal imaged portions of the orbits and globes. Clear paranasal sinuses and mastoid air cells. Other: None. IMPRESSION: 1. No acute intracranial abnormality. 2. Left-sided encephalomalacia, as before. 3. Significantly age advanced cerebral and cerebellar atrophy. Electronically Signed   By: Abigail Miyamoto M.D.   On: 07/30/2019 18:58   DG Chest Port 1 View  Result Date: 07/30/2019 CLINICAL DATA:  Fever. EXAM: PORTABLE CHEST 1 VIEW COMPARISON:  Prior chest radiographs 05/16/2019 and earlier. FINDINGS: Unchanged cardiomegaly. Mild central pulmonary vascular congestion. No appreciable airspace consolidation or frank pulmonary edema. No evidence of pleural effusion or pneumothorax. No acute bony abnormality is identified. IMPRESSION: Unchanged cardiomegaly with mild central pulmonary vascular congestion. No appreciable airspace consolidation or frank pulmonary edema. Electronically Signed   By: Kellie Simmering DO   On: 07/30/2019 17:53    Procedures Procedures (including critical care time)  Medications Ordered in ED Medications  acetaminophen (TYLENOL) tablet 650 mg (650 mg Oral Given 07/30/19 1820)  fentaNYL (SUBLIMAZE) injection 50 mcg (50 mcg Intravenous Given 07/30/19 1819)  sodium chloride 0.9 % bolus 500 mL ( Intravenous Stopped 07/30/19 2008)  dexamethasone (DECADRON) injection 4 mg (4 mg Intravenous Given 07/30/19 2157)    ED Course  I have reviewed the triage vital signs and the nursing notes.  Pertinent labs & imaging results that were available during my care of  the patient were reviewed by me and considered in my medical decision making (see chart for details).  Clinical Course as of Jul 31 22  Mon Jul 30, 2019  2031 Reassessed patient.  She said her headache is improved.  Blood work showing mildly elevated white blood cell count, slowly trending down hemoglobin.  Chemistries with slightly worsened creatinine and elevated potassium of 5.2 slightly hemolyzed.  Lactate normal.  CT head showing no intraparenchymal bleeding.  Chest x-ray showing cardiomegaly but no infiltrate.   [MB]  2228 Sed rate is elevated but on review of prior sed rate is always above 100.   [MB]  2256 Patient states her headache is resolved. Her infectious work-up is been fairly unremarkable. We will have her return home and keep close contact with her primary care doctor and her oncologist. Return instructions discussed.   [MB]    Clinical Course User Index [MB] Hayden Rasmussen, MD   MDM Rules/Calculators/A&P  This patient complains of right-sided stabbing headache intermittent for days along with low-grade fevers; this involves an extensive number of treatment Options and is a complaint that carries with it a high risk of complications and Morbidity. The differential includes with her history of ITP concern for intraparenchymal bleed, tension headache, medication side effect, metabolic derangement, infectious work-up including pneumonia, UTI, sepsis  I ordered, reviewed and interpreted labs, which included CBC with mildly elevated white count, stable hemoglobin, improved platelets, chemistries with slightly elevated creatinine from baseline, elevated potassium but slightly hemolyzed, urinalysis without signs of infection I ordered medication IV fluids Tylenol fentanyl Decadron with improvement in patient's pain I ordered imaging studies which included chest x-ray and CT head and I independently    visualized and interpreted imaging which showed no  acute findings Previous records obtained and reviewed in epic including last 2 visits to oncology where she had a fever and they put her on Levaquin.   After the interventions stated above, I reevaluated the patient and found patient's headache to be improved.  No real source identified on her fever but she is got a normal mental status.  Reviewed her IVIG and this can be a source of headache.  Feel she can be safely discharged and follow-up with her specialist.  It sounds like she has very close follow-up.  Return instructions discussed  Final Clinical Impression(s) / ED Diagnoses Final diagnoses:  Right-sided headache  Fever in adult    Rx / DC Orders ED Discharge Orders    None       Hayden Rasmussen, MD 07/31/19 0030

## 2019-07-30 NOTE — ED Triage Notes (Signed)
Per [facility] pt c/o right sided head pains since Wed after being discharged. Has trouble seeing due to cataract.

## 2019-07-30 NOTE — Discharge Instructions (Addendum)
You were seen in the hospital for worsening right-sided headache and low-grade fevers. You had blood work, chest x-ray, head CT, urinalysis and blood cultures that so far have not identified any obvious causes. Please continue to monitor your fever and continue your regular medications. Return to the emergency department if any worsening symptoms. Follow-up with your doctors.

## 2019-07-31 ENCOUNTER — Other Ambulatory Visit: Payer: Self-pay

## 2019-07-31 ENCOUNTER — Telehealth: Payer: Self-pay | Admitting: Nurse Practitioner

## 2019-07-31 ENCOUNTER — Emergency Department (HOSPITAL_COMMUNITY)
Admission: EM | Admit: 2019-07-31 | Discharge: 2019-08-01 | Disposition: A | Discharge status: Home / Self Care | Payer: Medicare Other | Source: Home / Self Care | Attending: Emergency Medicine | Admitting: Emergency Medicine

## 2019-07-31 ENCOUNTER — Encounter (HOSPITAL_COMMUNITY): Payer: Self-pay | Admitting: *Deleted

## 2019-07-31 DIAGNOSIS — T148XXA Other injury of unspecified body region, initial encounter: Secondary | ICD-10-CM

## 2019-07-31 DIAGNOSIS — S90822A Blister (nonthermal), left foot, initial encounter: Secondary | ICD-10-CM

## 2019-07-31 DIAGNOSIS — R238 Other skin changes: Secondary | ICD-10-CM

## 2019-07-31 LAB — CBC WITH DIFFERENTIAL/PLATELET
Abs Immature Granulocytes: 0.12 10*3/uL — ABNORMAL HIGH (ref 0.00–0.07)
Basophils Absolute: 0 10*3/uL (ref 0.0–0.1)
Basophils Relative: 0 %
Eosinophils Absolute: 0 10*3/uL (ref 0.0–0.5)
Eosinophils Relative: 0 %
HCT: 32.5 % — ABNORMAL LOW (ref 36.0–46.0)
Hemoglobin: 10.2 g/dL — ABNORMAL LOW (ref 12.0–15.0)
Immature Granulocytes: 1 %
Lymphocytes Relative: 4 %
Lymphs Abs: 0.4 10*3/uL — ABNORMAL LOW (ref 0.7–4.0)
MCH: 27.8 pg (ref 26.0–34.0)
MCHC: 31.4 g/dL (ref 30.0–36.0)
MCV: 88.6 fL (ref 80.0–100.0)
Monocytes Absolute: 1.6 10*3/uL — ABNORMAL HIGH (ref 0.1–1.0)
Monocytes Relative: 13 %
Neutro Abs: 10.1 10*3/uL — ABNORMAL HIGH (ref 1.7–7.7)
Neutrophils Relative %: 82 %
Platelets: 493 10*3/uL — ABNORMAL HIGH (ref 150–400)
RBC: 3.67 MIL/uL — ABNORMAL LOW (ref 3.87–5.11)
RDW: 19 % — ABNORMAL HIGH (ref 11.5–15.5)
WBC: 12.3 10*3/uL — ABNORMAL HIGH (ref 4.0–10.5)
nRBC: 0 % (ref 0.0–0.2)

## 2019-07-31 LAB — COMPREHENSIVE METABOLIC PANEL
ALT: 12 U/L (ref 0–44)
AST: 23 U/L (ref 15–41)
Albumin: 2.5 g/dL — ABNORMAL LOW (ref 3.5–5.0)
Alkaline Phosphatase: 62 U/L (ref 38–126)
Anion gap: 10 (ref 5–15)
BUN: 58 mg/dL — ABNORMAL HIGH (ref 6–20)
CO2: 21 mmol/L — ABNORMAL LOW (ref 22–32)
Calcium: 8.5 mg/dL — ABNORMAL LOW (ref 8.9–10.3)
Chloride: 100 mmol/L (ref 98–111)
Creatinine, Ser: 1.86 mg/dL — ABNORMAL HIGH (ref 0.44–1.00)
GFR calc Af Amer: 35 mL/min — ABNORMAL LOW (ref >60)
GFR calc non Af Amer: 31 mL/min — ABNORMAL LOW (ref >60)
Glucose, Bld: 161 mg/dL — ABNORMAL HIGH (ref 70–99)
Potassium: 5 mmol/L (ref 3.5–5.1)
Sodium: 131 mmol/L — ABNORMAL LOW (ref 135–145)
Total Bilirubin: 0.6 mg/dL (ref 0.3–1.2)
Total Protein: 8.3 g/dL — ABNORMAL HIGH (ref 6.5–8.1)

## 2019-07-31 LAB — CBG MONITORING, ED: Glucose-Capillary: 154 mg/dL — ABNORMAL HIGH (ref 70–99)

## 2019-07-31 MED ORDER — SODIUM CHLORIDE 0.9% FLUSH: 3.0000 mL | Freq: Once | INTRAVENOUS | Status: DC

## 2019-07-31 NOTE — ED Triage Notes (Signed)
Per report from [facility]. Pt has a blister on her left foot since yesterday, now painful. Home health nurse came in and told her to come be evaluated. No reddness or purulent drainage.      Pt says she has had increased swelling in her left leg, she has had blister on her foot for an unknown time, she is diabetic.

## 2019-08-01 ENCOUNTER — Telehealth: Payer: Self-pay | Admitting: Hematology and Oncology

## 2019-08-01 ENCOUNTER — Inpatient Hospital Stay: Payer: Medicare Other | Admitting: Hematology and Oncology

## 2019-08-01 ENCOUNTER — Telehealth: Payer: Self-pay | Admitting: *Deleted

## 2019-08-01 ENCOUNTER — Encounter: Payer: Self-pay | Admitting: *Deleted

## 2019-08-01 ENCOUNTER — Inpatient Hospital Stay: Payer: Medicare Other

## 2019-08-01 LAB — URINE CULTURE: Culture: NO GROWTH

## 2019-08-01 MED ORDER — BACITRACIN ZINC 500 UNIT/GM EX OINT
TOPICAL_OINTMENT | Freq: Every day | CUTANEOUS | Status: DC
  Filled 2019-08-01: qty 1.8

## 2019-08-01 MED ORDER — CEPHALEXIN 250 MG PO CAPS
500.0000 mg | ORAL_CAPSULE | Freq: Once | ORAL | Status: AC
  Administered 2019-08-01: 500 mg via ORAL
  Filled 2019-08-01: qty 2

## 2019-08-01 MED ORDER — CEPHALEXIN 500 MG PO CAPS
500.0000 mg | ORAL_CAPSULE | Freq: Two times a day (BID) | ORAL | 0 refills | Status: DC
Start: 2019-08-01 — End: 2019-08-08

## 2019-08-01 NOTE — Telephone Encounter (Signed)
Called and spoke with [place]. Patient ended up going to ER and was treated. Thanks!

## 2019-08-01 NOTE — Progress Notes (Signed)
Per MD, pt plts are 493 and okay for pt to proceed with cataract surgery.  Medical clearance successfully faxed to [place], PA with [facility] [phone]) to schedule pt within the next 2 weeks for surgery.

## 2019-08-01 NOTE — Discharge Instructions (Signed)
Please read and follow all provided instructions.  Your diagnoses today include:  1. Bullae   2. Blister of foot without infection, left, initial encounter   3. Blood blister     Tests performed today include:  Vital signs. See below for your results today.   Medications prescribed:   Keflex (cephalexin) - antibiotic  You have been prescribed an antibiotic medicine: take the entire course of medicine even if you are feeling better. Stopping early can cause the antibiotic not to work.  Take any prescribed medications only as directed.   Home care instructions:  Follow any educational materials contained in this packet. Keep affected area above the level of your heart when possible. Wash area gently twice a day with warm soapy water. Do not apply alcohol or hydrogen peroxide. Cover the area if it draining or weeping.   Follow-up instructions: It is very important that you see your doctor or the podiatrist (foot doctor) listed in the next 48 to 72 hours for recheck of your wound.  Return instructions:  Return to the Emergency Department if you have:  Fever  Worsening symptoms  Worsening pain  Worsening swelling  Redness of the skin that moves away from the affected area, especially if it streaks away from the affected area   Any other emergent concerns  Your vital signs today were: BP (!) 134/66 (BP Location: Right Arm)   Pulse 79   Temp 98.6 F (37 C) (Oral)   Resp 18   LMP  (LMP Unknown)   SpO2 99%  If your blood pressure (BP) was elevated above 135/85 this visit, please have this repeated by your doctor within one month. --------------

## 2019-08-01 NOTE — ED Notes (Signed)
Patient Alert and oriented to baseline. Stable and ambulatory to baseline. Patient verbalized understanding of the discharge instructions.  Patient belongings were taken by the patient.   

## 2019-08-01 NOTE — Telephone Encounter (Signed)
Received call from pt with complaint of right sided headache.  Pt describes the pain as a sharp stabbing sensation.  Pt states pain started on Saturday 07/28/19 and is unrelieved by over the counter medication.  Per MD pt to be seen in [facility] tomorrow morning. Pt notified of apt date and time and verbalized understanding.  RN educated pt that if head pain becomes severe, she will need to go to the emergency room tonight for further evaluation and treatment.  Pt verbalized understanding.

## 2019-08-01 NOTE — Telephone Encounter (Signed)
R/s appt per 7/27 sch msg - unable to reach pt . Left message with appt date and time

## 2019-08-01 NOTE — ED Provider Notes (Signed)
[facility] EMERGENCY DEPARTMENT Provider Note   CSN: [id number] Arrival date & time: 07/31/19  2012     History Chief Complaint  Patient presents with  . Blister    [name] is a 54 y.o. female.  Patient with history of idiopathic thrombocytopenia treated with IVIG, CHF, diabetes with neuropathy, status post right below the knee amputation, chronic kidney disease --presents to the emergency department today for a blood-filled blister to the left foot.  Patient has numbness in the foot at baseline.  She first noted the swelling yesterday.  She states that a home health nurse, who comes to her house once a week, looked at the area and recommended that she come to the emergency department to be checked.  Patient was recently diagnosed with sinusitis and is taking a course of Levaquin for this.  She was seen in the emergency department 2 days ago for facial pain and headache, which she states has resolved and improved.  While waiting in the waiting room, the blister opened up and drained serosanguineous fluid which now saturates her sock.  Patient does not report any fevers or chills in the past 24 hours.  She tells me that she is not established with any wound care clinics currently.  Onset of symptoms acute.  Course is constant.  Nothing makes symptoms better or worse.        Past Medical History:  Diagnosis Date  . Acute exacerbation of CHF (congestive heart failure) ([place]) 04/11/2019  . Anemia   . Arthritis    "spine, hands" (11/25/2015)  . Back pain    "all my back; probably 3 times/week" (11/25/2015)  . Chronic indwelling Foley catheter 03/04/2017  . Diabetic foot ulcer associated with type 2 diabetes mellitus ([place])   . Hypertension   . Intracerebral hemorrhage ([place]) As a teenager    States she had burst blood vessel as teenager, now with resultant minor visual field and hearing deficits  . Neuropathy   . Stroke [facility]) ~ 1982   "my feeling on my RLE,  right lower eye vision,  & hearing out of my right ear not the same since" (11/25/2015)  . Type II diabetes mellitus [facility])     Patient Active Problem List   Diagnosis Date Noted  . Nausea and vomiting 06/26/2019  . Hematuria 06/26/2019  . Hypertensive urgency 06/26/2019  . Acute diastolic CHF (congestive heart failure) ([place])   . CHF (congestive heart failure) ([place]) 04/11/2019  . Acute exacerbation of CHF (congestive heart failure) ([place]) 04/11/2019  . Leukopenia 04/11/2019  . History of ITP 04/11/2019  . Idiopathic thrombocytopenic purpura (ITP) (HCC) 11/16/2018  . Onychomycosis of toenail 10/16/2018  . Cataract of both eyes 10/16/2018  . Tinea pedis of left foot 10/16/2018  . Decreased vision in both eyes 10/16/2018  . CKD (chronic kidney disease) stage 3, GFR 30-59 ml/min   . Cellulitis of left lower extremity 01/25/2018  . Hyperkalemia 01/25/2018  . Poor compliance 03/08/2017  . Hypoxia 03/07/2017  . Thyroid nodule 03/07/2017  . Chronic indwelling Foley catheter 03/04/2017  . Type II diabetes mellitus ([place])   . Femur fracture ([place]) 03/02/2017  . Orthostatic dizziness   . Urinary tract infection without hematuria   . Enterococcus faecalis infection   . Near syncope 11/25/2015  . Dehydration 11/25/2015  . Orthostatic hypotension 11/25/2015  . UTI (urinary tract infection) 11/25/2015  . Acute worsening of stage 3 chronic kidney disease 09/24/2015  . Diabetes mellitus due to underlying  condition with chronic kidney disease, without long-term current use of insulin ([place])   . Fever   . Status post below knee amputation of right lower extremity ([place])   . Diabetic peripheral neuropathy ([place])   . Neuropathic pain   . Benign essential HTN   . Folliculitis   . Slow transit constipation   . Anemia of chronic disease   . Bilateral hydronephrosis   . Urinary retention   . CKD (chronic kidney disease)   . Uncontrolled type 2 diabetes mellitus with diabetic nephropathy, without  long-term current use of insulin ([place])   . Vasculopathy   . Debility 09/04/2015  . DM type 2 with diabetic peripheral neuropathy ([place])   . S/P BKA (below knee amputation) unilateral ([place])   . Medical non-compliance   . Benign skin lesion of multiple sites   . Tachypnea   . Acute blood loss anemia   . Neurogenic bladder   . Occult blood positive stool   . Rectovaginal fistula   . Controlled diabetes mellitus type 2 with complications ([place])   . Acute on chronic renal failure ([place])   . Hydronephrosis determined by ultrasound   . Papular rash   . Uncontrolled type 2 diabetes mellitus with complication ([place])   . Paresthesia   . Thrombocytopenia ([place]) 08/23/2015  . Pressure ulcer 08/23/2015  . Severe anemia 08/23/2015  . Lower urinary tract infectious disease 08/23/2015  . Absolute anemia 08/23/2015  . Peripheral vascular disease, unspecified ([place]) 04/27/2012  . Unilateral complete BKA ([place]) 07/09/2011  . Gas gangrene ([place]) 07/02/2011  . Sepsis(995.91) 07/02/2011  . Acute kidney injury superimposed on chronic kidney disease ([place]) 07/02/2011  . Diabetes mellitus ([place]) 11/22/2010  . Diabetic foot ulcer associated with type 2 diabetes mellitus ([place])   . Essential hypertension   . Arthritis   . Neuropathy ([place])   . Intracerebral hemorrhage [facility])     Past Surgical History:  Procedure Laterality Date  . AMPUTATION  11/24/2010   Procedure: AMPUTATION FOOT;  Surgeon: Wylene Simmer, MD;  Location: [place];  Service: Orthopedics;  Laterality: Right;  Right  FOOT TRANS METATARSAL AMPUTATION  . AMPUTATION  07/02/2011   Procedure: AMPUTATION BELOW KNEE;  Surgeon: Wylene Simmer, MD;  Location: [place];  Service: Orthopedics;  Laterality: Right;  . APPLICATION OF WOUND VAC  07/02/2011   Procedure: APPLICATION OF WOUND VAC;  Surgeon: Wylene Simmer, MD;  Location: [place];  Service: Orthopedics;  Laterality: Right;  . BRAIN SURGERY  1980's   Previous brain surgery in [place], [place]  . CESAREAN SECTION  2004;  2006  . COLONOSCOPY N/A 09/17/2015   Procedure: COLONOSCOPY;  Surgeon: Wilford Corner, MD;  Location: [facility] ENDOSCOPY;  Service: Endoscopy;  Laterality: N/A;  . ESOPHAGOGASTRODUODENOSCOPY N/A 09/17/2015   Procedure: ESOPHAGOGASTRODUODENOSCOPY (EGD);  Surgeon: Wilford Corner, MD;  Location: [facility] ENDOSCOPY;  Service: Endoscopy;  Laterality: N/A;  . I & D EXTREMITY  11/24/2010   Procedure: IRRIGATION AND DEBRIDEMENT EXTREMITY;  Surgeon: Wylene Simmer, MD;  Location: [place];  Service: Orthopedics;  Laterality: Left;  Debriedment of left plantar ulcer and trimming of toenails  . I & D EXTREMITY  07/02/2011   Procedure: IRRIGATION AND DEBRIDEMENT EXTREMITY;  Surgeon: Wylene Simmer, MD;  Location: [place];  Service: Orthopedics;  Laterality: Left;  I & D of Left Foot  . I & D EXTREMITY  07/06/2011   Procedure: IRRIGATION AND DEBRIDEMENT EXTREMITY;  Surgeon: Wylene Simmer, MD;  Location: [place];  Service: Orthopedics;  Laterality: Right;  IRRIGATION/DEBRIDEMENT RIGHT BELOW KNEE AMPUTATION  . TUBAL LIGATION  2006     OB History   No obstetric history on file.     Family History  Problem Relation Age of Onset  . Diabetes Mother   . Hypertension Mother   . Hyperlipidemia Mother   . Diabetes Father   . Cancer Father        prostate  . Hyperlipidemia Father   . Hypertension Father   . Diabetes Brother   . Peripheral Artery Disease Brother        has had toes amputated  . Diabetes Son     Social History   Tobacco Use  . Smoking status: Never Smoker  . Smokeless tobacco: Never Used  Vaping Use  . Vaping Use: Never used  Substance Use Topics  . Alcohol use: Yes    Alcohol/week: 2.0 standard drinks    Types: 2 Glasses of wine per week  . Drug use: No    Home Medications Prior to Admission medications   Medication Sig Start Date End Date Taking? Authorizing Provider  acetaminophen (TYLENOL) 500 MG tablet Take 1,000 mg by mouth every 6 (six) hours as needed for mild pain.    [provider]   amLODipine (NORVASC) 5 MG tablet Take 1 tablet (5 mg total) by mouth daily. 06/29/19 07/29/19  Darliss Cheney, MD  amLODipine (NORVASC) 5 MG tablet Take 5 mg by mouth daily.    [provider]  aspirin EC 81 MG tablet Take 81 mg by mouth daily as needed for moderate pain. Swallow whole.    [provider]  Blood Glucose Monitoring Suppl (PRODIGY AUTOCODE BLOOD GLUCOSE) w/Device KIT 1 kit by Does not apply route in the morning, at noon, in the evening, and at bedtime. 06/06/19 06/05/20  Vevelyn Francois, NP  furosemide (LASIX) 40 MG tablet Take 1 tablet (40 mg total) by mouth 2 (two) times daily. Patient not taking: Reported on 07/30/2019 04/19/19   Florencia Reasons, MD  gabapentin (NEURONTIN) 400 MG capsule Take 1 capsule (400 mg total) by mouth 2 (two) times daily. 04/19/19   Florencia Reasons, MD  isosorbide mononitrate (IMDUR) 30 MG 24 hr tablet Take 1 tablet (30 mg total) by mouth daily. 06/18/19 12/15/19  Vevelyn Francois, NP  levofloxacin (LEVAQUIN) 500 MG tablet Take 1 tablet (500 mg total) by mouth daily. 07/25/19   Tanner, Lyndon Code., PA-C  metFORMIN (GLUCOPHAGE) 500 MG tablet Take 1 tablet (500 mg total) by mouth 2 (two) times daily with a meal. Patient taking differently: Take 500 mg by mouth daily with breakfast.  11/13/18 06/26/19  Nita Sells, MD  metFORMIN (GLUCOPHAGE) 500 MG tablet Take 500 mg by mouth 2 (two) times daily with a meal.    [provider]  metoprolol succinate (TOPROL-XL) 50 MG 24 hr tablet Take 1 tablet (50 mg total) by mouth daily. Take with or immediately following a meal. 06/18/19 12/15/19  Vevelyn Francois, NP  ondansetron (ZOFRAN ODT) 4 MG disintegrating tablet Take 1 tablet (4 mg total) by mouth every 8 (eight) hours as needed for nausea or vomiting. 06/26/19   Horton, Barbette Hair, MD  ondansetron (ZOFRAN-ODT) 4 MG disintegrating tablet Take 1 tablet (4 mg total) by mouth every 8 (eight) hours as needed for nausea or vomiting. 06/29/19   Darliss Cheney, MD    rosuvastatin (CRESTOR) 20 MG tablet Take 1 tablet (20 mg total) by mouth daily. 06/18/19 12/15/19  Vevelyn Francois, NP  Allergies    Patient has no known allergies.  Review of Systems   Review of Systems  Constitutional: Negative for chills and fever.  HENT: Negative for rhinorrhea and sore throat.   Eyes: Negative for redness.  Respiratory: Negative for cough.   Cardiovascular: Negative for chest pain.  Gastrointestinal: Negative for abdominal pain, diarrhea, nausea and vomiting.  Genitourinary: Negative for dysuria.  Musculoskeletal: Negative for myalgias.  Skin: Positive for wound. Negative for rash.  Neurological: Positive for numbness (chronic). Negative for headaches (resolved).    Physical Exam Updated Vital Signs BP (!) 134/66 (BP Location: Right Arm)   Pulse 79   Temp 98.6 F (37 C) (Oral)   Resp 18   LMP  (LMP Unknown)   SpO2 99%   Physical Exam Vitals and nursing note reviewed.  Constitutional:      General: She is not in acute distress.    Appearance: She is well-developed.  HENT:     Head: Normocephalic and atraumatic.     Right Ear: External ear normal.     Left Ear: External ear normal.     Nose: Nose normal.  Eyes:     Conjunctiva/sclera: Conjunctivae normal.  Cardiovascular:     Rate and Rhythm: Normal rate.  Pulmonary:     Effort: No respiratory distress.     Breath sounds: No wheezing, rhonchi or rales.  Abdominal:     Palpations: Abdomen is soft.     Tenderness: There is no abdominal tenderness. There is no guarding or rebound.  Musculoskeletal:     Cervical back: Normal range of motion and neck supple.     Right lower leg: No edema.     Left lower leg: No edema.     Comments: Previous R BKA.   L foot: There is a broken bullae containing blood extending from the medial aspect of the mid left foot extending to the plantar aspect, about 3/4 of the way to the lateral aspect.  I was able to express a moderate amount of dark blood.  Minimal  erythema just adjacent to the wound edges.   Patient also has an area of thickened skin just proximal to the big toe on the plantar aspect.  Skin:    General: Skin is warm.     Findings: No rash.  Neurological:     General: No focal deficit present.     Mental Status: She is alert. Mental status is at baseline.     Motor: No weakness.  Psychiatric:        Mood and Affect: Mood normal.              ED Results / Procedures / Treatments   Labs (all labs ordered are listed, but only abnormal results are displayed) Labs Reviewed  COMPREHENSIVE METABOLIC PANEL - Abnormal; Notable for the following components:      Result Value   Sodium 131 (*)    CO2 21 (*)    Glucose, Bld 161 (*)    BUN 58 (*)    Creatinine, Ser 1.86 (*)    Calcium 8.5 (*)    Total Protein 8.3 (*)    Albumin 2.5 (*)    GFR calc non Af Amer 31 (*)    GFR calc Af Amer 35 (*)    All other components within normal limits  CBC WITH DIFFERENTIAL/PLATELET - Abnormal; Notable for the following components:   WBC 12.3 (*)    RBC 3.67 (*)    Hemoglobin 10.2 (*)  HCT 32.5 (*)    RDW 19.0 (*)    Platelets 493 (*)    Neutro Abs 10.1 (*)    Lymphs Abs 0.4 (*)    Monocytes Absolute 1.6 (*)    Abs Immature Granulocytes 0.12 (*)    All other components within normal limits  CBG MONITORING, ED - Abnormal; Notable for the following components:   Glucose-Capillary 154 (*)    All other components within normal limits    EKG None  Radiology CT Head Wo Contrast  Result Date: 07/30/2019 CLINICAL DATA:  Headache. Right-sided head pain since Wednesday. Fever. EXAM: CT HEAD WITHOUT CONTRAST TECHNIQUE: Contiguous axial images were obtained from the base of the skull through the vertex without intravenous contrast. COMPARISON:  11/05/2018 FINDINGS: Brain: Encephalomalacia again identified within left parietal and temporal regions. Significantly age advanced cerebral and cerebellar atrophy. No mass lesion, hemorrhage,  hydrocephalus, acute infarct, intra-axial, or extra-axial fluid collection. Vascular: No hyperdense vessel or unexpected calcification. Skull: Left frontal parietal craniotomy. Sinuses/Orbits: Normal imaged portions of the orbits and globes. Clear paranasal sinuses and mastoid air cells. Other: None. IMPRESSION: 1. No acute intracranial abnormality. 2. Left-sided encephalomalacia, as before. 3. Significantly age advanced cerebral and cerebellar atrophy. Electronically Signed   By: Abigail Miyamoto M.D.   On: 07/30/2019 18:58   DG Chest Port 1 View  Result Date: 07/30/2019 CLINICAL DATA:  Fever. EXAM: PORTABLE CHEST 1 VIEW COMPARISON:  Prior chest radiographs 05/16/2019 and earlier. FINDINGS: Unchanged cardiomegaly. Mild central pulmonary vascular congestion. No appreciable airspace consolidation or frank pulmonary edema. No evidence of pleural effusion or pneumothorax. No acute bony abnormality is identified. IMPRESSION: Unchanged cardiomegaly with mild central pulmonary vascular congestion. No appreciable airspace consolidation or frank pulmonary edema. Electronically Signed   By: Kellie Simmering DO   On: 07/30/2019 17:53    Procedures Procedures (including critical care time)  Medications Ordered in ED Medications  sodium chloride flush (NS) 0.9 % injection 3 mL (has no administration in time range)  bacitracin ointment (has no administration in time range)  cephALEXin (KEFLEX) capsule 500 mg (500 mg Oral Given 08/01/19 6195)    ED Course  I have reviewed the triage vital signs and the nursing notes.  Pertinent labs & imaging results that were available during my care of the patient were reviewed by me and considered in my medical decision making (see chart for details).  Patient seen and examined.  She has a hemorrhagic bullae which has broken.  No significant signs of infection surrounding this area.  Per patient's report it is not been there for more than 24 to 48 hours and I have low concern for  osteomyelitis developing at this time.  Patient is high risk for infection given her diabetes and neuropathy.  She denies any acute injuries, however she could have sustained some sort of trauma without realizing it due to neuropathy.  Her platelet count is good today.  She is not on any other blood thinners.  Vital signs reviewed and are as follows: BP (!) 134/66 (BP Location: Right Arm)   Pulse 79   Temp 98.6 F (37 C) (Oral)   Resp 18   LMP  (LMP Unknown)   SpO2 99%   At this point, I do not feel that the patient requires admission.  However I stressed that the area needs to be monitored very closely and rechecked in 48 to 72 hours.  We discussed signs and symptoms of an infection, including worsening redness or swelling, fevers,  redness streaking up the leg, purulent drainage.  If these occur she needs to seek urgent medical attention for recheck.  She will be started on renal dosed Keflex, 500 mg twice daily.  She should have completed the previous Levaquin at this point.  Wound was bandaged in the emergency department and patient encouraged to continue to protect the area with a bulky bandage.  Referral for podiatry given.    MDM Rules/Calculators/A&P                          Patient with history of diabetes and neuropathy presents with hemorrhagic bullae of the left foot.  There does not appear to be a significant infection at this time.  Patient is not febrile.  White blood cell count is normal.  History of thrombocytopenia however platelet count is not low today.  She is at high risk for infection and complications from wound due to her diabetes and neuropathy.  This will require very close monitoring.  Patient was started on Keflex.  She will need to follow-up closely with her doctor.  Strict return instructions discussed.  Do not suspect osteomyelitis at this time.    Final Clinical Impression(s) / ED Diagnoses Final diagnoses:  Bullae  Blister of foot without infection, left,  initial encounter  Blood blister    Rx / DC Orders ED Discharge Orders         Ordered    cephALEXin (KEFLEX) 500 MG capsule  2 times daily     Discontinue  Reprint     08/01/19 0745           Carlisle Cater, PA-C 28/41/32 4401    Delora Fuel, MD 02/72/53 [phone]

## 2019-08-01 NOTE — ED Notes (Signed)
Bacitracin ointment, non-adherent pad, and dressing applied.

## 2019-08-01 NOTE — Telephone Encounter (Signed)
ER

## 2019-08-02 ENCOUNTER — Encounter (HOSPITAL_COMMUNITY): Payer: Self-pay

## 2019-08-02 ENCOUNTER — Inpatient Hospital Stay (HOSPITAL_COMMUNITY)
Admission: EM | Admit: 2019-08-02 | Discharge: 2019-08-08 | DRG: 571 | Disposition: A | Discharge status: Home Health | Payer: Medicare Other | Attending: Internal Medicine | Admitting: Internal Medicine

## 2019-08-02 ENCOUNTER — Inpatient Hospital Stay (HOSPITAL_BASED_OUTPATIENT_CLINIC_OR_DEPARTMENT_OTHER): Payer: Medicare Other | Admitting: Medical

## 2019-08-02 ENCOUNTER — Encounter: Payer: Self-pay | Admitting: Medical

## 2019-08-02 ENCOUNTER — Emergency Department (HOSPITAL_COMMUNITY): Payer: Medicare Other

## 2019-08-02 ENCOUNTER — Other Ambulatory Visit: Payer: Self-pay

## 2019-08-02 VITALS — BP 90/48 | HR 93 | Temp 99.5°F | Resp 17 | Ht 66.0 in

## 2019-08-02 DIAGNOSIS — E871 Hypo-osmolality and hyponatremia: Secondary | ICD-10-CM | POA: Diagnosis present

## 2019-08-02 DIAGNOSIS — M19042 Primary osteoarthritis, left hand: Secondary | ICD-10-CM | POA: Diagnosis present

## 2019-08-02 DIAGNOSIS — D693 Immune thrombocytopenic purpura: Secondary | ICD-10-CM | POA: Diagnosis present

## 2019-08-02 DIAGNOSIS — L02612 Cutaneous abscess of left foot: Secondary | ICD-10-CM | POA: Diagnosis present

## 2019-08-02 DIAGNOSIS — E1142 Type 2 diabetes mellitus with diabetic polyneuropathy: Secondary | ICD-10-CM | POA: Diagnosis present

## 2019-08-02 DIAGNOSIS — S90822A Blister (nonthermal), left foot, initial encounter: Secondary | ICD-10-CM | POA: Diagnosis present

## 2019-08-02 DIAGNOSIS — E119 Type 2 diabetes mellitus without complications: Secondary | ICD-10-CM

## 2019-08-02 DIAGNOSIS — L84 Corns and callosities: Secondary | ICD-10-CM | POA: Diagnosis present

## 2019-08-02 DIAGNOSIS — N319 Neuromuscular dysfunction of bladder, unspecified: Secondary | ICD-10-CM | POA: Diagnosis present

## 2019-08-02 DIAGNOSIS — M79672 Pain in left foot: Secondary | ICD-10-CM | POA: Diagnosis present

## 2019-08-02 DIAGNOSIS — Z833 Family history of diabetes mellitus: Secondary | ICD-10-CM | POA: Diagnosis not present

## 2019-08-02 DIAGNOSIS — Z20822 Contact with and (suspected) exposure to covid-19: Secondary | ICD-10-CM | POA: Diagnosis present

## 2019-08-02 DIAGNOSIS — E1122 Type 2 diabetes mellitus with diabetic chronic kidney disease: Secondary | ICD-10-CM | POA: Diagnosis present

## 2019-08-02 DIAGNOSIS — I130 Hypertensive heart and chronic kidney disease with heart failure and stage 1 through stage 4 chronic kidney disease, or unspecified chronic kidney disease: Secondary | ICD-10-CM | POA: Diagnosis present

## 2019-08-02 DIAGNOSIS — Z7982 Long term (current) use of aspirin: Secondary | ICD-10-CM

## 2019-08-02 DIAGNOSIS — L03116 Cellulitis of left lower limb: Secondary | ICD-10-CM | POA: Diagnosis present

## 2019-08-02 DIAGNOSIS — I10 Essential (primary) hypertension: Secondary | ICD-10-CM | POA: Diagnosis present

## 2019-08-02 DIAGNOSIS — D72829 Elevated white blood cell count, unspecified: Secondary | ICD-10-CM | POA: Diagnosis present

## 2019-08-02 DIAGNOSIS — L97529 Non-pressure chronic ulcer of other part of left foot with unspecified severity: Secondary | ICD-10-CM | POA: Diagnosis present

## 2019-08-02 DIAGNOSIS — Z8249 Family history of ischemic heart disease and other diseases of the circulatory system: Secondary | ICD-10-CM | POA: Diagnosis not present

## 2019-08-02 DIAGNOSIS — Z993 Dependence on wheelchair: Secondary | ICD-10-CM | POA: Diagnosis not present

## 2019-08-02 DIAGNOSIS — M869 Osteomyelitis, unspecified: Secondary | ICD-10-CM | POA: Diagnosis present

## 2019-08-02 DIAGNOSIS — E1169 Type 2 diabetes mellitus with other specified complication: Secondary | ICD-10-CM | POA: Diagnosis present

## 2019-08-02 DIAGNOSIS — Z89511 Acquired absence of right leg below knee: Secondary | ICD-10-CM | POA: Diagnosis not present

## 2019-08-02 DIAGNOSIS — Z79899 Other long term (current) drug therapy: Secondary | ICD-10-CM

## 2019-08-02 DIAGNOSIS — Z7984 Long term (current) use of oral hypoglycemic drugs: Secondary | ICD-10-CM

## 2019-08-02 DIAGNOSIS — E11621 Type 2 diabetes mellitus with foot ulcer: Secondary | ICD-10-CM | POA: Diagnosis present

## 2019-08-02 DIAGNOSIS — I509 Heart failure, unspecified: Secondary | ICD-10-CM

## 2019-08-02 DIAGNOSIS — M19041 Primary osteoarthritis, right hand: Secondary | ICD-10-CM | POA: Diagnosis present

## 2019-08-02 DIAGNOSIS — L089 Local infection of the skin and subcutaneous tissue, unspecified: Secondary | ICD-10-CM

## 2019-08-02 DIAGNOSIS — X58XXXA Exposure to other specified factors, initial encounter: Secondary | ICD-10-CM | POA: Diagnosis present

## 2019-08-02 DIAGNOSIS — I739 Peripheral vascular disease, unspecified: Secondary | ICD-10-CM | POA: Diagnosis present

## 2019-08-02 DIAGNOSIS — N1832 Chronic kidney disease, stage 3b: Secondary | ICD-10-CM | POA: Diagnosis present

## 2019-08-02 DIAGNOSIS — Z8679 Personal history of other diseases of the circulatory system: Secondary | ICD-10-CM

## 2019-08-02 DIAGNOSIS — N179 Acute kidney failure, unspecified: Secondary | ICD-10-CM | POA: Diagnosis present

## 2019-08-02 DIAGNOSIS — L03119 Cellulitis of unspecified part of limb: Secondary | ICD-10-CM | POA: Diagnosis not present

## 2019-08-02 DIAGNOSIS — I5032 Chronic diastolic (congestive) heart failure: Secondary | ICD-10-CM | POA: Diagnosis present

## 2019-08-02 DIAGNOSIS — M479 Spondylosis, unspecified: Secondary | ICD-10-CM | POA: Diagnosis present

## 2019-08-02 DIAGNOSIS — E1151 Type 2 diabetes mellitus with diabetic peripheral angiopathy without gangrene: Secondary | ICD-10-CM | POA: Diagnosis present

## 2019-08-02 DIAGNOSIS — Z89519 Acquired absence of unspecified leg below knee: Secondary | ICD-10-CM

## 2019-08-02 LAB — COMPREHENSIVE METABOLIC PANEL
ALT: 9 U/L (ref 0–44)
AST: 16 U/L (ref 15–41)
Albumin: 2.7 g/dL — ABNORMAL LOW (ref 3.5–5.0)
Alkaline Phosphatase: 62 U/L (ref 38–126)
Anion gap: 11 (ref 5–15)
BUN: 72 mg/dL — ABNORMAL HIGH (ref 6–20)
CO2: 22 mmol/L (ref 22–32)
Calcium: 8.5 mg/dL — ABNORMAL LOW (ref 8.9–10.3)
Chloride: 99 mmol/L (ref 98–111)
Creatinine, Ser: 2.36 mg/dL — ABNORMAL HIGH (ref 0.44–1.00)
GFR calc Af Amer: 27 mL/min — ABNORMAL LOW (ref >60)
GFR calc non Af Amer: 23 mL/min — ABNORMAL LOW (ref >60)
Glucose, Bld: 149 mg/dL — ABNORMAL HIGH (ref 70–99)
Potassium: 4.3 mmol/L (ref 3.5–5.1)
Sodium: 132 mmol/L — ABNORMAL LOW (ref 135–145)
Total Bilirubin: 0.3 mg/dL (ref 0.3–1.2)
Total Protein: 8.1 g/dL (ref 6.5–8.1)

## 2019-08-02 LAB — LACTIC ACID, PLASMA: Lactic Acid, Venous: 1.3 mmol/L (ref 0.5–1.9)

## 2019-08-02 LAB — CBC WITH DIFFERENTIAL/PLATELET
Abs Immature Granulocytes: 0.14 10*3/uL — ABNORMAL HIGH (ref 0.00–0.07)
Basophils Absolute: 0.1 10*3/uL (ref 0.0–0.1)
Basophils Relative: 0 %
Eosinophils Absolute: 0 10*3/uL (ref 0.0–0.5)
Eosinophils Relative: 0 %
HCT: 32.8 % — ABNORMAL LOW (ref 36.0–46.0)
Hemoglobin: 10.3 g/dL — ABNORMAL LOW (ref 12.0–15.0)
Immature Granulocytes: 1 %
Lymphocytes Relative: 5 %
Lymphs Abs: 0.9 10*3/uL (ref 0.7–4.0)
MCH: 28 pg (ref 26.0–34.0)
MCHC: 31.4 g/dL (ref 30.0–36.0)
MCV: 89.1 fL (ref 80.0–100.0)
Monocytes Absolute: 1.8 10*3/uL — ABNORMAL HIGH (ref 0.1–1.0)
Monocytes Relative: 10 %
Neutro Abs: 15.2 10*3/uL — ABNORMAL HIGH (ref 1.7–7.7)
Neutrophils Relative %: 84 %
Platelets: 513 10*3/uL — ABNORMAL HIGH (ref 150–400)
RBC: 3.68 MIL/uL — ABNORMAL LOW (ref 3.87–5.11)
RDW: 19.2 % — ABNORMAL HIGH (ref 11.5–15.5)
WBC: 18.2 10*3/uL — ABNORMAL HIGH (ref 4.0–10.5)
nRBC: 0 % (ref 0.0–0.2)

## 2019-08-02 LAB — HEMOGLOBIN A1C
Hgb A1c MFr Bld: 6.6 % — ABNORMAL HIGH (ref 4.8–5.6)
Mean Plasma Glucose: 142.72 mg/dL

## 2019-08-02 LAB — SARS CORONAVIRUS 2 BY RT PCR (HOSPITAL ORDER, PERFORMED IN ~~LOC~~ HOSPITAL LAB): SARS Coronavirus 2: NEGATIVE

## 2019-08-02 LAB — CBG MONITORING, ED: Glucose-Capillary: 135 mg/dL — ABNORMAL HIGH (ref 70–99)

## 2019-08-02 LAB — GLUCOSE, CAPILLARY: Glucose-Capillary: 124 mg/dL — ABNORMAL HIGH (ref 70–99)

## 2019-08-02 MED ORDER — PIPERACILLIN-TAZOBACTAM 3.375 G IVPB 30 MIN
3.3750 g | Freq: Once | INTRAVENOUS | Status: AC
  Administered 2019-08-02: 3.375 g via INTRAVENOUS
  Filled 2019-08-02: qty 50

## 2019-08-02 MED ORDER — METOPROLOL SUCCINATE ER 50 MG PO TB24
50.0000 mg | ORAL_TABLET | Freq: Every day | ORAL | Status: DC
  Administered 2019-08-03 – 2019-08-08 (×6): 50 mg via ORAL
  Filled 2019-08-02 (×6): qty 1

## 2019-08-02 MED ORDER — VANCOMYCIN HCL IN DEXTROSE 1-5 GM/200ML-% IV SOLN
1000.0000 mg | INTRAVENOUS | Status: DC
  Administered 2019-08-03 – 2019-08-04 (×2): 1000 mg via INTRAVENOUS
  Filled 2019-08-02 (×2): qty 200

## 2019-08-02 MED ORDER — ACETAMINOPHEN 325 MG PO TABS
650.0000 mg | ORAL_TABLET | Freq: Four times a day (QID) | ORAL | Status: DC | PRN
  Administered 2019-08-02 – 2019-08-03 (×2): 650 mg via ORAL
  Filled 2019-08-02 (×2): qty 2

## 2019-08-02 MED ORDER — ACETAMINOPHEN 650 MG RE SUPP: 650.0000 mg | Freq: Four times a day (QID) | RECTAL | Status: DC | PRN

## 2019-08-02 MED ORDER — VANCOMYCIN HCL 2000 MG/400ML IV SOLN
2000.0000 mg | Freq: Once | INTRAVENOUS | Status: AC
  Administered 2019-08-02: 2000 mg via INTRAVENOUS
  Filled 2019-08-02: qty 400

## 2019-08-02 MED ORDER — SENNOSIDES-DOCUSATE SODIUM 8.6-50 MG PO TABS: 1.0000 | ORAL_TABLET | Freq: Every evening | ORAL | Status: DC | PRN

## 2019-08-02 MED ORDER — ASPIRIN EC 81 MG PO TBEC: 81.0000 mg | DELAYED_RELEASE_TABLET | Freq: Every day | ORAL | Status: DC | PRN

## 2019-08-02 MED ORDER — ONDANSETRON HCL 4 MG/2ML IJ SOLN
4.0000 mg | Freq: Four times a day (QID) | INTRAMUSCULAR | Status: DC | PRN
  Administered 2019-08-06 – 2019-08-07 (×3): 4 mg via INTRAVENOUS
  Filled 2019-08-02 (×3): qty 2

## 2019-08-02 MED ORDER — HYDROCODONE-ACETAMINOPHEN 5-325 MG PO TABS
1.0000 | ORAL_TABLET | ORAL | Status: DC | PRN
  Administered 2019-08-04 – 2019-08-05 (×3): 1 via ORAL
  Administered 2019-08-05: 2 via ORAL
  Administered 2019-08-05: 1 via ORAL
  Filled 2019-08-02 (×4): qty 1
  Filled 2019-08-02: qty 2

## 2019-08-02 MED ORDER — ONDANSETRON HCL 4 MG PO TABS: 4.0000 mg | ORAL_TABLET | Freq: Four times a day (QID) | ORAL | Status: DC | PRN

## 2019-08-02 MED ORDER — GABAPENTIN 400 MG PO CAPS
400.0000 mg | ORAL_CAPSULE | Freq: Two times a day (BID) | ORAL | Status: DC
  Administered 2019-08-02 – 2019-08-08 (×13): 400 mg via ORAL
  Filled 2019-08-02 (×13): qty 1

## 2019-08-02 MED ORDER — ROSUVASTATIN CALCIUM 20 MG PO TABS
20.0000 mg | ORAL_TABLET | Freq: Every day | ORAL | Status: DC
  Administered 2019-08-03 – 2019-08-08 (×6): 20 mg via ORAL
  Filled 2019-08-02 (×6): qty 1

## 2019-08-02 MED ORDER — ENOXAPARIN SODIUM 40 MG/0.4ML ~~LOC~~ SOLN
40.0000 mg | SUBCUTANEOUS | Status: DC
  Administered 2019-08-02 – 2019-08-07 (×6): 40 mg via SUBCUTANEOUS
  Filled 2019-08-02 (×6): qty 0.4

## 2019-08-02 MED ORDER — SODIUM CHLORIDE 0.9 % IV SOLN: INTRAVENOUS | Status: DC

## 2019-08-02 MED ORDER — VANCOMYCIN HCL IN DEXTROSE 1-5 GM/200ML-% IV SOLN: 1000.0000 mg | Freq: Once | INTRAVENOUS | Status: DC

## 2019-08-02 MED ORDER — INSULIN ASPART 100 UNIT/ML ~~LOC~~ SOLN
0.0000 [IU] | Freq: Three times a day (TID) | SUBCUTANEOUS | Status: DC
  Administered 2019-08-03: 1 [IU] via SUBCUTANEOUS
  Administered 2019-08-03: 2 [IU] via SUBCUTANEOUS
  Administered 2019-08-03 – 2019-08-04 (×3): 1 [IU] via SUBCUTANEOUS
  Administered 2019-08-05: 2 [IU] via SUBCUTANEOUS
  Administered 2019-08-05 – 2019-08-08 (×5): 1 [IU] via SUBCUTANEOUS
  Filled 2019-08-02: qty 0.09

## 2019-08-02 MED ORDER — ISOSORBIDE MONONITRATE ER 30 MG PO TB24
30.0000 mg | ORAL_TABLET | Freq: Every day | ORAL | Status: DC
  Administered 2019-08-02 – 2019-08-08 (×7): 30 mg via ORAL
  Filled 2019-08-02 (×7): qty 1

## 2019-08-02 MED ORDER — PIPERACILLIN-TAZOBACTAM 3.375 G IVPB
3.3750 g | Freq: Three times a day (TID) | INTRAVENOUS | Status: DC
  Administered 2019-08-02 – 2019-08-03 (×2): 3.375 g via INTRAVENOUS
  Filled 2019-08-02 (×2): qty 50

## 2019-08-02 NOTE — Patient Instructions (Signed)

## 2019-08-02 NOTE — Plan of Care (Signed)

## 2019-08-02 NOTE — Progress Notes (Signed)
Pt's left foot dressing bloody/purulent.  Dressing removed, bruising and pus on L arch, redness/tightness and pain going up L leg.  Pt reports that pain has increased since her wound was drained during recent ER visit and that redness was not seen in leg at that time per the ER provider.  Dressing changed for transport to [facility] room 3 for further evaluation, pt's shoe placed in belongings bag on back of pt's personal wheelchair.  Referral to social work placed.  Pt transported via w/c to [facility] by PA Zenaida Niece with all belongings.

## 2019-08-02 NOTE — Progress Notes (Addendum)
Pharmacy Antibiotic Note  [name] is a 54 y.o. female admitted on 08/02/2019 with  history of idiopathic thrombocytopenia treated with IVIG, CHF, diabetes with neuropathy, status post right below the knee amputation, chronic kidney disease --presents to the ED today for a blister to the left foot, was seen yesterday at [facility] for same complaint.  Pt was given a Rx for abx but was unable to get them.  Pharmacy has been consulted for Vancomycin dosing for cellulitis.  Plan: Vancomycin 2gm IV x 1 then vancomycin 1gm IV q24h Follow renal function and clinical course  Height: 5\' 7"  (170.2 cm) Weight: (!) 95.3 kg (210 lb) IBW/kg (Calculated) : 61.6  Temp (24hrs), Avg:100.5 F (38.1 C), Min:99.5 F (37.5 C), Max:101.5 F (38.6 C)  Recent Labs  Lab 07/30/19 1716 07/30/19 1717 07/31/19 2038  WBC 12.7*  --  12.3*  CREATININE 1.82*  --  1.86*  LATICACIDVEN  --  1.7  --     Estimated Creatinine Clearance: 41.9 mL/min (A) (by C-G formula based on SCr of 1.86 mg/dL (H)).    No Known Allergies  Antimicrobials this admission: 7/29 vanc >> 7/29 zosyn x1 Dose adjustments this admission:   Microbiology results: 7/29 BCx:   Thank you for allowing pharmacy to be a part of this patient's care.  8/29 RPh 08/02/2019, 11:23 AM   Pharmacy now consulted to also dose zosyn   Plan: Zosyn 3.375mg  IV q8h extended interval, pt received dose x 1 in ED Daily Scr while on vanc and zosyn Continue to follow renal function and clinical course  08/04/2019 RPh 08/02/2019, 2:29 PM

## 2019-08-02 NOTE — Consult Note (Signed)
Reason for Consult: Left foot diabetic ulceration with large medial and plantar blister and concern for deep infection Referring Physician: [facility]  Cheryl HansenMichelle L Wolf is an 54 y.o. female.  HPI: Patient with history of idiopathic thrombocytopenia treated with IVIG, CHF, diabetes with neuropathy, status post right below the knee amputation, chronic kidney disease --presented to the emergency department today for a blood-filled blister to the left foot.  She presented yesterday with similar findings however states that today the blister has worsened.  She has had a longstanding diabetic ulceration of the plantar aspect of her first MTP joint on that side.  She has a nurse that comes out to look at her foot.  There was concern for deep infection given increasing redness, warmth and blistering.  Orthopedics was consulted due to the foot infection.  Patient states the wound has been present for a long time.  She is not sure how long.  She does not know her hemoglobin A1c.  She does have a history of amputation of the right side by Dr. Victorino Wolf in 2013.  She states that she is febrile.  Does not feel ill otherwise.  Past Medical History:  Diagnosis Date  . Acute exacerbation of CHF (congestive heart failure) (HCC) 04/11/2019  . Anemia   . Arthritis    "spine, hands" (11/25/2015)  . Back pain    "all my back; probably 3 times/week" (11/25/2015)  . Chronic indwelling Foley catheter 03/04/2017  . Diabetic foot ulcer associated with type 2 diabetes mellitus (HCC)   . Hypertension   . Intracerebral hemorrhage (HCC) As a teenager    States she had burst blood vessel as teenager, now with resultant minor visual field and hearing deficits  . Neuropathy   . Stroke [facility](HCC) ~ 1982   "my feeling on my RLE, right lower eye vision,  & hearing out of my right ear not the same since" (11/25/2015)  . Type II diabetes mellitus (HCC)     Past Surgical History:  Procedure Laterality Date  .  AMPUTATION  11/24/2010   Procedure: AMPUTATION FOOT;  Surgeon: Cheryl ArthursJohn Hewitt, MD;  [facility];  Service: Orthopedics;  Laterality: Right;  Right  FOOT TRANS METATARSAL AMPUTATION  . AMPUTATION  07/02/2011   Procedure: AMPUTATION BELOW KNEE;  Surgeon: Cheryl ArthursJohn Hewitt, MD;  Location: [facility] OR;  Service: Orthopedics;  Laterality: Right;  . APPLICATION OF WOUND VAC  07/02/2011   Procedure: APPLICATION OF WOUND VAC;  Surgeon: Cheryl ArthursJohn Hewitt, MD;  Location: [facility] OR;  Service: Orthopedics;  Laterality: Right;  . BRAIN SURGERY  1980's   [place]  . CESAREAN SECTION  2004; 2006  . COLONOSCOPY N/A 09/17/2015   Procedure: COLONOSCOPY;  Surgeon: Cheryl RakesVincent Schooler, MD;  [facility];  Service: Endoscopy;  Laterality: N/A;  . ESOPHAGOGASTRODUODENOSCOPY N/A 09/17/2015   Procedure: ESOPHAGOGASTRODUODENOSCOPY (EGD);  Surgeon: Cheryl RakesVincent Schooler, MD;  [facility];  Service: Endoscopy;  Laterality: N/A;  . I & D EXTREMITY  11/24/2010   Procedure: IRRIGATION AND DEBRIDEMENT EXTREMITY;  Surgeon: Cheryl ArthursJohn Hewitt, MD;  Location: [facility] OR;  Service: Orthopedics;  Laterality: Left;  Debriedment of left plantar ulcer and trimming of toenails  . I & D EXTREMITY  07/02/2011   Procedure: IRRIGATION AND DEBRIDEMENT EXTREMITY;  Surgeon: Cheryl ArthursJohn Hewitt, MD;  Location: [facility] OR;  Service: Orthopedics;  Laterality: Left;  I & D of Left Foot  . I & D EXTREMITY  07/06/2011   Procedure: IRRIGATION AND DEBRIDEMENT EXTREMITY;  Surgeon: Cheryl Arthurs, MD;  Location: [facility] OR;  Service: Orthopedics;  Laterality: Right;  IRRIGATION/DEBRIDEMENT RIGHT BELOW KNEE AMPUTATION  . TUBAL LIGATION  2006    Family History  Problem Relation Age of Onset  . Diabetes Mother   . Hypertension Mother   . Hyperlipidemia Mother   . Diabetes Father   . Cancer Father        prostate  . Hyperlipidemia Father   . Hypertension Father   . Diabetes Brother   . Peripheral Artery Disease Brother        has had toes amputated  .  Diabetes Son     Social History:  reports that she has never smoked. She has never used smokeless tobacco. She reports current alcohol use of about 2.0 standard drinks of alcohol per week. She reports that she does not use drugs.  Allergies: No Known Allergies  Medications: I have reviewed the patient's current medications.  Results for orders placed or performed during the hospital encounter of 08/02/19 (from the past 48 hour(s))  SARS Coronavirus 2 by RT PCR (hospital order, performed in [facility] hospital lab) Nasopharyngeal Nasopharyngeal Swab     Status: None   Collection Time: 08/02/19 11:09 AM   Specimen: Nasopharyngeal Swab  Result Value Ref Range   SARS Coronavirus 2 NEGATIVE NEGATIVE    Comment: (NOTE) SARS-CoV-2 target nucleic acids are NOT DETECTED.  The SARS-CoV-2 RNA is generally detectable in upper and lower respiratory specimens during the acute phase of infection. The lowest concentration of SARS-CoV-2 viral copies this assay can detect is 250 copies / mL. A negative result does not preclude SARS-CoV-2 infection and should not be used as the sole basis for treatment or other patient management decisions.  A negative result may occur with improper specimen collection / handling, submission of specimen other than nasopharyngeal swab, presence of viral mutation(s) within the areas targeted by this assay, and inadequate number of viral copies (<250 copies / mL). A negative result must be combined with clinical observations, patient history, and epidemiological information.  Fact Sheet for Patients:   BoilerBrush.com.cy  Fact Sheet for Healthcare Providers: https://pope.com/  This test is not yet approved or  cleared by the [place] FDA and has been authorized for detection and/or diagnosis of SARS-CoV-2 by FDA under an Emergency Use Authorization (EUA).  This EUA will remain in effect (meaning this test can be  used) for the duration of the COVID-19 declaration under Section 564(b)(1) of the Act, 21 U.S.C. section 360bbb-3(b)(1), unless the authorization is terminated or revoked sooner.  Performed at [facility], [address] [place], [place] [address]   CBC with Differential     Status: Abnormal   Collection Time: 08/02/19 11:31 AM  Result Value Ref Range   WBC 18.2 (H) 4.0 - 10.5 K/uL   RBC 3.68 (L) 3.87 - 5.11 MIL/uL   Hemoglobin 10.3 (L) 12.0 - 15.0 g/dL   HCT 36.6 (L) 36 - 46 %   MCV 89.1 80.0 - 100.0 fL   MCH 28.0 26.0 - 34.0 pg   MCHC 31.4 30.0 - 36.0 g/dL   RDW 44.0 (H) 34.7 - 42.5 %   Platelets 513 (H) 150 - 400 K/uL   nRBC 0.0 0.0 - 0.2 %   Neutrophils Relative % 84 %   Neutro Abs 15.2 (H) 1.7 - 7.7 K/uL   Lymphocytes Relative 5 %   Lymphs Abs 0.9 0.7 - 4.0 K/uL   Monocytes Relative 10 %  Monocytes Absolute 1.8 (H) 0 - 1 K/uL   Eosinophils Relative 0 %   Eosinophils Absolute 0.0 0 - 0 K/uL   Basophils Relative 0 %   Basophils Absolute 0.1 0 - 0 K/uL   WBC Morphology TOXIC GRANULATION    Immature Granulocytes 1 %   Abs Immature Granulocytes 0.14 (H) 0.00 - 0.07 K/uL    Comment: Performed at [facility], [address], [place], [place] [address]  Comprehensive metabolic panel     Status: Abnormal   Collection Time: 08/02/19 11:31 AM  Result Value Ref Range   Sodium 132 (L) 135 - 145 mmol/L   Potassium 4.3 3.5 - 5.1 mmol/L   Chloride 99 98 - 111 mmol/L   CO2 22 22 - 32 mmol/L   Glucose, Bld 149 (H) 70 - 99 mg/dL    Comment: Glucose reference range applies only to samples taken after fasting for at least 8 hours.   BUN 72 (H) 6 - 20 mg/dL   Creatinine, Ser 0.86 (H) 0.44 - 1.00 mg/dL   Calcium 8.5 (L) 8.9 - 10.3 mg/dL   Total Protein 8.1 6.5 - 8.1 g/dL   Albumin 2.7 (L) 3.5 - 5.0 g/dL   AST 16 15 - 41 U/L   ALT 9 0 - 44 U/L   Alkaline Phosphatase 62 38 - 126 U/L   Total Bilirubin 0.3 0.3 - 1.2 mg/dL   GFR calc non Af Amer  23 (L) >60 mL/min   GFR calc Af Amer 27 (L) >60 mL/min   Anion gap 11 5 - 15    Comment: Performed at [facility], [address] [place], [place] [address]  Lactic acid, plasma     Status: None   Collection Time: 08/02/19 11:31 AM  Result Value Ref Range   Lactic Acid, Venous 1.3 0.5 - 1.9 mmol/L    Comment: Performed at [facility], [address], [place], [place] [address]    DG Foot Complete Left  Result Date: 08/02/2019 CLINICAL DATA:  Plantar ulceration. EXAM: LEFT FOOT - COMPLETE 3+ VIEW COMPARISON:  None. FINDINGS: There is no evidence of fracture or dislocation. There is no evidence of arthropathy or other focal bone abnormality. There is mild to moderate severity vascular calcification. Marked severity diffuse soft tissue swelling is seen. A 1.8 cm superficial soft tissue defect is seen along the plantar aspect of the distal left foot. IMPRESSION: Marked severity diffuse soft tissue swelling and superficial plantar ulceration without evidence of an acute osseous abnormality. Electronically Signed   By: Aram Candela M.D.   On: 08/02/2019 12:06    Review of Systems  Constitutional: Negative.   HENT: Negative.   Respiratory: Negative.   Cardiovascular: Negative.   Gastrointestinal: Negative.   Genitourinary: Negative.   Musculoskeletal:       Left foot blister and diabetic ulcer Baseline dense diabetic neuropathy  Skin: Positive for wound.  Neurological: Positive for numbness.  Psychiatric/Behavioral: Negative.    Blood pressure (!) 139/74, pulse 88, temperature (!) 101.5 F (38.6 C), temperature source Oral, resp. rate 18, height 5\' 7"  (1.702 m), weight (!) 95.3 kg, SpO2 99 %. Physical Exam Vitals reviewed.  HENT:     Head: Normocephalic.     Mouth/Throat:     Mouth: Mucous membranes are moist.  Eyes:     Extraocular Movements: Extraocular movements intact.  Cardiovascular:     Rate and Rhythm: Normal rate.  Pulmonary:  Effort: Pulmonary effort is normal.  Abdominal:     Palpations: Abdomen is soft.  Musculoskeletal:     Cervical back: Neck supple.     Comments: Evidence of right below the knee amputation Left foot with ulceration on the plantar aspect of the hallux MTP joint.  There is surrounding thickened tissue and fluctuance in this area.  There is deep necrotic tissue.  Ulcer does not probe to bone.  There is also a large blister with apparent purulent material underneath sloughed epidermal tissue.  This encompasses the entire medial aspect of her foot at the arch as well as plantarly.  Approximately 15 cm x 12 cm large.  Patient is insensate to palpation.  Foot is well-perfused with brisk capillary refill.  Skin:    Comments: See above.  Large blister on the left foot.  Diabetic ulceration on the plantar first MTP.  Neurological:     Mental Status: She is alert.     Sensory: Sensory deficit present.  Psychiatric:        Mood and Affect: Mood normal.     Assessment/Plan: Patient has a plantar ulceration of the hallux MTP joint that has some necrotic tissue underlying a large callus.  We will plan for debridement of this ulceration at the bedside.  She also has a large blister with apparent underlying abscess of the medial and plantar foot.  We will plan for debridement of this as well at the bedside.  She has an insensate foot.  I discussed this with the patient and she agreed.  Hopefully she will respond to the antibiotics.  The x-rays did not reveal any obvious osteomyelitis at the level of the ulceration or where the blister is.  We did discuss that she may require formal debridement in the operating room versus more definitive treatment in the form of amputation if her symptoms do not respond well to the antibiotics.  She understands this but is adamant that she does not want to proceed with amputation.  We will continue to monitor her response to antibiotics and overall tissue health.  If she  develops severe tissue necrosis then we may need to be more aggressive.  I would recommend wound care consultation after bedside debridement was performed today.  I will continue to follow the patient while she is in the house and as an outpatient as needed.  We will proceed with further surgery if this is warranted as her hospital stay progresses.  For now no need to keep her n.p.o. from an orthopedic standpoint.  Procedure note: After consent was obtained the plantar ulceration was debrided sharply with an 11 blade.  I sharply debrided skin, subcutaneous tissue, tendinous tissue and fascial tissue below the ulcer.  This was done to remove dead tissue.  There was underlying necrotic tissue and the wound did not probe to bone.  There was some purulent material there that was expressed and the wound was fully irrigated copiously with normal saline and iodine wash.  There was bleeding skin edges after the debridement.  The size of the ulcer was approximately 3 cm in diameter.  I then turned my attention to the medial foot at the site of the blister.  An incision was made overlying the blister and there was a rush of serous type fluid.  No obvious purulence was noted.  There was no significant foul odor present.  The nonviable epidermal and dermal tissue was debrided sharply with an 11 blade and excised.  There was a  small area of full-thickness ulceration on the medial plantar foot with concern for underlying abscess.  Hemostat was used within this cavity to deloculated underlying fluid.  Multiple bursal cavities were deloculated and a small amount of fluid was expressed.  The wound was then irrigated copiously with normal saline.  Xeroform dressing was placed on the top of the area of exposed underlying tissue after the debridement.    The ulcer was packed with gauze.  Soft dressing and Ace wrap was placed loosely.  Patient was instructed to maintain nonweightbearing in order to ensure no pressure on these  wounds and to maximize healing potential.  Terance Hart 08/02/2019, 3:24 PM

## 2019-08-02 NOTE — ED Provider Notes (Signed)
[facility] DEPT Provider Note   CSN: [id number] Arrival date & time: 08/02/19  1018     History Chief Complaint  Patient presents with  . Foot Pain    [name] is a 54 y.o. female.  The history is provided by the patient and medical records. No language interpreter was used.  Foot Pain This is a new problem. The current episode started more than 2 days ago. The problem occurs constantly. The problem has been rapidly worsening. Pertinent negatives include no chest pain, no abdominal pain, no headaches and no shortness of breath. Nothing aggravates the symptoms. Nothing relieves the symptoms. She has tried nothing for the symptoms. The treatment provided no relief.       Past Medical History:  Diagnosis Date  . Acute exacerbation of CHF (congestive heart failure) ([place]) 04/11/2019  . Anemia   . Arthritis    "spine, hands" (11/25/2015)  . Back pain    "all my back; probably 3 times/week" (11/25/2015)  . Chronic indwelling Foley catheter 03/04/2017  . Diabetic foot ulcer associated with type 2 diabetes mellitus ([place])   . Hypertension   . Intracerebral hemorrhage ([place]) As a teenager    States she had burst blood vessel as teenager, now with resultant minor visual field and hearing deficits  . Neuropathy   . Stroke [facility]) ~ 1982   "my feeling on my RLE, right lower eye vision,  & hearing out of my right ear not the same since" (11/25/2015)  . Type II diabetes mellitus [facility])     Patient Active Problem List   Diagnosis Date Noted  . Nausea and vomiting 06/26/2019  . Hematuria 06/26/2019  . Hypertensive urgency 06/26/2019  . Acute diastolic CHF (congestive heart failure) ([place])   . CHF (congestive heart failure) ([place]) 04/11/2019  . Acute exacerbation of CHF (congestive heart failure) ([place]) 04/11/2019  . Leukopenia 04/11/2019  . History of ITP 04/11/2019  . Idiopathic thrombocytopenic purpura (ITP) (HCC) 11/16/2018  . Onychomycosis of  toenail 10/16/2018  . Cataract of both eyes 10/16/2018  . Tinea pedis of left foot 10/16/2018  . Decreased vision in both eyes 10/16/2018  . CKD (chronic kidney disease) stage 3, GFR 30-59 ml/min   . Cellulitis of left lower extremity 01/25/2018  . Hyperkalemia 01/25/2018  . Poor compliance 03/08/2017  . Hypoxia 03/07/2017  . Thyroid nodule 03/07/2017  . Chronic indwelling Foley catheter 03/04/2017  . Type II diabetes mellitus ([place])   . Femur fracture ([place]) 03/02/2017  . Orthostatic dizziness   . Urinary tract infection without hematuria   . Enterococcus faecalis infection   . Near syncope 11/25/2015  . Dehydration 11/25/2015  . Orthostatic hypotension 11/25/2015  . UTI (urinary tract infection) 11/25/2015  . Acute worsening of stage 3 chronic kidney disease 09/24/2015  . Diabetes mellitus due to underlying condition with chronic kidney disease, without long-term current use of insulin ([place])   . Fever   . Status post below knee amputation of right lower extremity ([place])   . Diabetic peripheral neuropathy ([place])   . Neuropathic pain   . Benign essential HTN   . Folliculitis   . Slow transit constipation   . Anemia of chronic disease   . Bilateral hydronephrosis   . Urinary retention   . CKD (chronic kidney disease)   . Uncontrolled type 2 diabetes mellitus with diabetic nephropathy, without long-term current use of insulin ([place])   . Vasculopathy   . Debility 09/04/2015  . DM type  2 with diabetic peripheral neuropathy ([place])   . S/P BKA (below knee amputation) unilateral ([place])   . Medical non-compliance   . Benign skin lesion of multiple sites   . Tachypnea   . Acute blood loss anemia   . Neurogenic bladder   . Occult blood positive stool   . Rectovaginal fistula   . Controlled diabetes mellitus type 2 with complications ([place])   . Acute on chronic renal failure ([place])   . Hydronephrosis determined by ultrasound   . Papular rash   . Uncontrolled type 2 diabetes mellitus with  complication ([place])   . Paresthesia   . Thrombocytopenia ([place]) 08/23/2015  . Pressure ulcer 08/23/2015  . Severe anemia 08/23/2015  . Lower urinary tract infectious disease 08/23/2015  . Absolute anemia 08/23/2015  . Peripheral vascular disease, unspecified ([place]) 04/27/2012  . Unilateral complete BKA ([place]) 07/09/2011  . Gas gangrene ([place]) 07/02/2011  . Sepsis(995.91) 07/02/2011  . Acute kidney injury superimposed on chronic kidney disease ([place]) 07/02/2011  . Diabetes mellitus ([place]) 11/22/2010  . Diabetic foot ulcer associated with type 2 diabetes mellitus ([place])   . Essential hypertension   . Arthritis   . Neuropathy ([place])   . Intracerebral hemorrhage [facility])     Past Surgical History:  Procedure Laterality Date  . AMPUTATION  11/24/2010   Procedure: AMPUTATION FOOT;  Surgeon: Wylene Simmer, MD;  Location: [facility];  Service: Orthopedics;  Laterality: Right;  Right  FOOT TRANS METATARSAL AMPUTATION  . AMPUTATION  07/02/2011   Procedure: AMPUTATION BELOW KNEE;  Surgeon: Wylene Simmer, MD;  Location: [place];  Service: Orthopedics;  Laterality: Right;  . APPLICATION OF WOUND VAC  07/02/2011   Procedure: APPLICATION OF WOUND VAC;  Surgeon: Wylene Simmer, MD;  Location: [place] [facility];  Service: Orthopedics;  Laterality: Right;  . BRAIN SURGERY  1980's   Previous brain surgery in [place]  . CESAREAN SECTION  2004; 2006  . COLONOSCOPY N/A 09/17/2015   Procedure: COLONOSCOPY;  Surgeon: Wilford Corner, MD;  Location[facility] ENDOSCOPY;  Service: Endoscopy;  Laterality: N/A;  . ESOPHAGOGASTRODUODENOSCOPY N/A 09/17/2015   Procedure: ESOPHAGOGASTRODUODENOSCOPY (EGD);  Surgeon: Wilford Corner, MD;  Location: [facility] ENDOSCOPY;  Service: Endoscopy;  Laterality: N/A;  . I & D EXTREMITY  11/24/2010   Procedure: IRRIGATION AND DEBRIDEMENT EXTREMITY;  Surgeon: Wylene Simmer, MD;  Location: [facility];  Service: Orthopedics;  Laterality: Left;  Debriedment of left plantar ulcer and trimming of toenails  . I & D EXTREMITY  07/02/2011    Procedure: IRRIGATION AND DEBRIDEMENT EXTREMITY;  Surgeon: Wylene Simmer, MD;  Location: [facility];  Service: Orthopedics;  Laterality: Left;  I & D of Left Foot  . I & D EXTREMITY  07/06/2011   Procedure: IRRIGATION AND DEBRIDEMENT EXTREMITY;  Surgeon: Wylene Simmer, MD;  Location: [facility];  Service: Orthopedics;  Laterality: Right;  IRRIGATION/DEBRIDEMENT RIGHT BELOW KNEE AMPUTATION  . TUBAL LIGATION  2006     OB History   No obstetric history on file.     Family History  Problem Relation Age of Onset  . Diabetes Mother   . Hypertension Mother   . Hyperlipidemia Mother   . Diabetes Father   . Cancer Father        prostate  . Hyperlipidemia Father   . Hypertension Father   . Diabetes Brother   . Peripheral Artery Disease Brother        has had toes amputated  . Diabetes Son     Social History  Tobacco Use  . Smoking status: Never Smoker  . Smokeless tobacco: Never Used  Vaping Use  . Vaping Use: Never used  Substance Use Topics  . Alcohol use: Yes    Alcohol/week: 2.0 standard drinks    Types: 2 Glasses of wine per week  . Drug use: No    Home Medications Prior to Admission medications   Medication Sig Start Date End Date Taking? Authorizing Provider  acetaminophen (TYLENOL) 500 MG tablet Take 1,000 mg by mouth every 6 (six) hours as needed for mild pain.    [provider]  amLODipine (NORVASC) 5 MG tablet Take 1 tablet (5 mg total) by mouth daily. 06/29/19 07/29/19  Darliss Cheney, MD  amLODipine (NORVASC) 5 MG tablet Take 5 mg by mouth daily.    [provider]  aspirin EC 81 MG tablet Take 81 mg by mouth daily as needed for moderate pain. Swallow whole.    [provider]  Blood Glucose Monitoring Suppl (PRODIGY AUTOCODE BLOOD GLUCOSE) w/Device KIT 1 kit by Does not apply route in the morning, at noon, in the evening, and at bedtime. 06/06/19 06/05/20  Vevelyn Francois, NP  cephALEXin (KEFLEX) 500 MG capsule Take 1 capsule (500 mg total) by mouth 2  (two) times daily. 08/01/19   Carlisle Cater, PA-C  furosemide (LASIX) 40 MG tablet Take 1 tablet (40 mg total) by mouth 2 (two) times daily. Patient not taking: Reported on 07/30/2019 04/19/19   Florencia Reasons, MD  gabapentin (NEURONTIN) 400 MG capsule Take 1 capsule (400 mg total) by mouth 2 (two) times daily. 04/19/19   Florencia Reasons, MD  isosorbide mononitrate (IMDUR) 30 MG 24 hr tablet Take 1 tablet (30 mg total) by mouth daily. 06/18/19 12/15/19  Vevelyn Francois, NP  levofloxacin (LEVAQUIN) 500 MG tablet Take 1 tablet (500 mg total) by mouth daily. 07/25/19   Tanner, Lyndon Code., PA-C  metFORMIN (GLUCOPHAGE) 500 MG tablet Take 1 tablet (500 mg total) by mouth 2 (two) times daily with a meal. Patient taking differently: Take 500 mg by mouth daily with breakfast.  11/13/18 06/26/19  Nita Sells, MD  metFORMIN (GLUCOPHAGE) 500 MG tablet Take 500 mg by mouth 2 (two) times daily with a meal.    [provider]  metoprolol succinate (TOPROL-XL) 50 MG 24 hr tablet Take 1 tablet (50 mg total) by mouth daily. Take with or immediately following a meal. 06/18/19 12/15/19  Vevelyn Francois, NP  ondansetron (ZOFRAN ODT) 4 MG disintegrating tablet Take 1 tablet (4 mg total) by mouth every 8 (eight) hours as needed for nausea or vomiting. 06/26/19   Horton, Barbette Hair, MD  ondansetron (ZOFRAN-ODT) 4 MG disintegrating tablet Take 1 tablet (4 mg total) by mouth every 8 (eight) hours as needed for nausea or vomiting. 06/29/19   Darliss Cheney, MD  rosuvastatin (CRESTOR) 20 MG tablet Take 1 tablet (20 mg total) by mouth daily. 06/18/19 12/15/19  Vevelyn Francois, NP    Allergies    Patient has no known allergies.  Review of Systems   Review of Systems  Constitutional: Positive for chills and fever. Negative for diaphoresis and fatigue.  HENT: Negative for congestion.   Eyes: Negative for visual disturbance.  Respiratory: Negative for cough, chest tightness, shortness of breath and wheezing.   Cardiovascular: Negative  for chest pain and palpitations.  Gastrointestinal: Negative for abdominal pain.  Genitourinary: Negative for dysuria and flank pain.  Musculoskeletal: Negative for back pain and neck pain.  Skin: Positive  for color change and rash.  Neurological: Negative for light-headedness, numbness and headaches.  Psychiatric/Behavioral: Negative for agitation.    Physical Exam Updated Vital Signs BP (!) 148/59 (BP Location: Left Arm)   Pulse 92   Temp (!) 101.5 F (38.6 C) (Oral)   Resp 18   LMP  (LMP Unknown)   SpO2 98%   Physical Exam Vitals and nursing note reviewed.  Constitutional:      General: She is not in acute distress.    Appearance: She is well-developed. She is not ill-appearing, toxic-appearing or diaphoretic.  HENT:     Head: Normocephalic and atraumatic.     Right Ear: External ear normal.     Left Ear: External ear normal.     Nose: Nose normal. No congestion or rhinorrhea.     Mouth/Throat:     Mouth: Mucous membranes are moist.     Pharynx: No oropharyngeal exudate or posterior oropharyngeal erythema.  Eyes:     Conjunctiva/sclera: Conjunctivae normal.     Pupils: Pupils are equal, round, and reactive to light.  Cardiovascular:     Rate and Rhythm: Normal rate.     Pulses: Normal pulses.     Heart sounds: No murmur heard.   Pulmonary:     Effort: Pulmonary effort is normal. No respiratory distress.     Breath sounds: No stridor. No wheezing, rhonchi or rales.  Chest:     Chest wall: No tenderness.  Abdominal:     General: Abdomen is flat. There is no distension.     Tenderness: There is no abdominal tenderness. There is no right CVA tenderness, left CVA tenderness or rebound.  Musculoskeletal:        General: Tenderness present.     Cervical back: Normal range of motion and neck supple. No tenderness.     Comments: Right below the knee amputation.  Left leg has erythema, purulence, fluctuance, tenderness, and red streaking going up the leg.  Palpable pulse,  baseline sensation, and normal strength in the left foot.  Skin:    General: Skin is warm.     Capillary Refill: Capillary refill takes less than 2 seconds.     Findings: Erythema and rash present.  Neurological:     Mental Status: She is alert and oriented to person, place, and time. Mental status is at baseline.     Motor: No weakness or abnormal muscle tone.     Deep Tendon Reflexes: Reflexes are normal and symmetric.  Psychiatric:        Mood and Affect: Mood normal.           ED Results / Procedures / Treatments   Labs (all labs ordered are listed, but only abnormal results are displayed) Labs Reviewed  CBC WITH DIFFERENTIAL/PLATELET - Abnormal; Notable for the following components:      Result Value   WBC 18.2 (*)    RBC 3.68 (*)    Hemoglobin 10.3 (*)    HCT 32.8 (*)    RDW 19.2 (*)    Platelets 513 (*)    Neutro Abs 15.2 (*)    Monocytes Absolute 1.8 (*)    Abs Immature Granulocytes 0.14 (*)    All other components within normal limits  COMPREHENSIVE METABOLIC PANEL - Abnormal; Notable for the following components:   Sodium 132 (*)    Glucose, Bld 149 (*)    BUN 72 (*)    Creatinine, Ser 2.36 (*)    Calcium 8.5 (*)  Albumin 2.7 (*)    GFR calc non Af Amer 23 (*)    GFR calc Af Amer 27 (*)    All other components within normal limits  SARS CORONAVIRUS 2 BY RT PCR (HOSPITAL ORDER, [facility])  CULTURE, BLOOD (ROUTINE X 2)  CULTURE, BLOOD (ROUTINE X 2)  LACTIC ACID, PLASMA  LACTIC ACID, PLASMA  CBC  CREATININE, SERUM  HEMOGLOBIN A1C    EKG None  Radiology DG Foot Complete Left  Result Date: 08/02/2019 CLINICAL DATA:  Plantar ulceration. EXAM: LEFT FOOT - COMPLETE 3+ VIEW COMPARISON:  None. FINDINGS: There is no evidence of fracture or dislocation. There is no evidence of arthropathy or other focal bone abnormality. There is mild to moderate severity vascular calcification. Marked severity diffuse soft tissue swelling is  seen. A 1.8 cm superficial soft tissue defect is seen along the plantar aspect of the distal left foot. IMPRESSION: Marked severity diffuse soft tissue swelling and superficial plantar ulceration without evidence of an acute osseous abnormality. Electronically Signed   By: Virgina Norfolk M.D.   On: 08/02/2019 12:06    Procedures Procedures (including critical care time)  CRITICAL CARE Performed by: Gwenyth Allegra Kezia Benevides Total critical care time: 35 minutes Critical care time was exclusive of separately billable procedures and treating other patients. Critical care was necessary to treat or prevent imminent or life-threatening deterioration. Critical care was time spent personally by me on the following activities: development of treatment plan with patient and/or surrogate as well as nursing, discussions with consultants, evaluation of patient's response to treatment, examination of patient, obtaining history from patient or surrogate, ordering and performing treatments and interventions, ordering and review of laboratory studies, ordering and review of radiographic studies, pulse oximetry and re-evaluation of patient's condition.   Medications Ordered in ED Medications  vancomycin (VANCOCIN) IVPB 1000 mg/200 mL premix (has no administration in time range)  piperacillin-tazobactam (ZOSYN) IVPB 3.375 g (has no administration in time range)  enoxaparin (LOVENOX) injection 40 mg (has no administration in time range)  acetaminophen (TYLENOL) tablet 650 mg (has no administration in time range)    Or  acetaminophen (TYLENOL) suppository 650 mg (has no administration in time range)  HYDROcodone-acetaminophen (NORCO/VICODIN) 5-325 MG per tablet 1-2 tablet (has no administration in time range)  senna-docusate (Senokot-S) tablet 1 tablet (has no administration in time range)  ondansetron (ZOFRAN) tablet 4 mg (has no administration in time range)    Or  ondansetron (ZOFRAN) injection 4 mg (has no  administration in time range)  insulin aspart (novoLOG) injection 0-9 Units (has no administration in time range)  0.9 %  sodium chloride infusion (has no administration in time range)  aspirin EC tablet 81 mg (has no administration in time range)  gabapentin (NEURONTIN) capsule 400 mg (has no administration in time range)  isosorbide mononitrate (IMDUR) 24 hr tablet 30 mg (has no administration in time range)  metoprolol succinate (TOPROL-XL) 24 hr tablet 50 mg (has no administration in time range)  rosuvastatin (CRESTOR) tablet 20 mg (has no administration in time range)  piperacillin-tazobactam (ZOSYN) IVPB 3.375 g (3.375 g Intravenous New Bag/Given 08/02/19 1136)  vancomycin (VANCOREADY) IVPB 2000 mg/400 mL (2,000 mg Intravenous New Bag/Given 08/02/19 1224)    ED Course  I have reviewed the triage vital signs and the nursing notes.  Pertinent labs & imaging results that were available during my care of the patient were reviewed by me and considered in my medical decision making (see chart for  details).    MDM Rules/Calculators/A&P                          [name] is a 54 y.o. female with a past medical history significant for diabetes wit, peripheral neuropathy, right h BKA due to infection and gangrene, prior intracerebral hemorrhage, hypertension, CKD, CHF, restless legs, and prior ITP status post IVIG who presents at the direction of her oncology team for worsened left foot infection.  Patient was seen yesterday for left foot pain and was found to have a likely hemorrhagic bulla on her left foot that at that time did not appear to be infected.  The previous team was extremely concerned about developing infection and started her on Keflex.  She was recently on Levaquin per the chart for sinusitis.  Patient was discharged without difficulty yesterday but then today has noticed a drastic change in the foot.  Now there is purulence, pus, and red streaking going up her right leg.  She  also is now febrile at 101.5 on arrival.  She saw her oncology team today who told her to come to the emergency department for IV antibiotics and admission.  Patient is concerned because she has already lost her right leg to infection in the past.  Patient denies any chills, congestion, cough, nausea, vomiting, urinary symptoms, or GI symptoms.  She reports her pain is a 6 out of 10 in the left foot.   On exam, patient has an impressive change compared to the pictures from yesterday in the chart.  Patient now has an area of purulence and darkness on the left foot as well as redness streaking up the right leg.  It is very warm.  It is tender.  I did not feel crepitance.  She does have similar sensation to prior with it being diminished in the left foot but does have palpable DP and PT pulse on my exam.  She is able to move the foot.  Lungs clear and chest nontender.  Abdomen nontender.  No hip tenderness.  We will get an x-ray as I am concerned about potential for osteomyelitis developing versus subcutaneous gas to signify necrotizing fasciitis or gangrene.  Patient will be started on broad-spectrum antibiotics and will have screening labs.  Anticipate admission after x-ray and antibiotics of started.  We will get a Covid test for admission.            1:26 PM Patient does have a leukocytosis.  Creatinine is elevated from prior.  Covid test is negative.  Lactic acid not elevated.  Foot x-ray showed soft tissue swelling without evidence of bony abnormality.  Doubt development of osteomyelitis at this point but MRI would be better to evaluate.  Patient started on broad-spectrum antibiotics.  Patient will be admitted to medicine service after work-up was completed.  They requested I speak with orthopedics given the concern she may need washout and incision and drainage of this area of infection.  I will call [facility] as she is seeing Dr. [name] in the past for other procedures on the right leg.  1:47  PM Just spoke again with [facility] and they would recommend that the on-call orthopedist to be involved as the patient has not seen Dr. [name] since 2013 and has not followed up with him.  He is also not available at this time.  On-call orthopedics team will be called for consultation for this patient being admitted to medicine service. On-call  ortho will see patient.    Final Clinical Impression(s) / ED Diagnoses Final diagnoses:  Left foot infection  Cellulitis of left lower extremity     Clinical Impression: 1. Left foot infection   2. Cellulitis of left lower extremity     Disposition: Admit  This note was prepared with assistance of Dragon voice recognition software. Occasional wrong-word or sound-a-like substitutions may have occurred due to the inherent limitations of voice recognition software.     Francisco Ostrovsky, Gwenyth Allegra, MD 08/02/19 1538

## 2019-08-02 NOTE — ED Notes (Signed)
RN attempted to call report, receiving RN did not answer.

## 2019-08-02 NOTE — ED Notes (Signed)
MD bedside with irrigation and debridement tray.

## 2019-08-02 NOTE — ED Notes (Signed)
Only blue tube sent down for second BC due to limited pull back from IV access.

## 2019-08-02 NOTE — ED Notes (Addendum)
Pt complaining of blister on right foot that she noticed on Tuesday. Pt was seen at [facility] yesterday for same complaint. Pt was given a prescription for antibiotics yesterday but was unable to get them. Pt states blister has been bleeding. Pt is also complaining of headache.

## 2019-08-02 NOTE — Progress Notes (Signed)
The patient presented to the clinic today after calling yesterday stating that she had to have an appointment for evaluation of an ongoing headache.  She was seen in the [facility] emergency room less than 48 hours ago for her headache which by the end of her visit she stated had resolved.  She had a head CT which returned showing:  FINDINGS: Brain: Encephalomalacia again identified within left parietal and temporal regions. Significantly age advanced cerebral and cerebellar atrophy. No mass lesion, hemorrhage, hydrocephalus, acute infarct, intra-axial, or extra-axial fluid collection.  Vascular: No hyperdense vessel or unexpected calcification.  Skull: Left frontal parietal craniotomy.  Sinuses/Orbits: Normal imaged portions of the orbits and globes. Clear paranasal sinuses and mastoid air cells.  Other: None.  IMPRESSION: 1. No acute intracranial abnormality. 2. Left-sided encephalomalacia, as before. 3. Significantly age advanced cerebral and cerebellar atrophy.  She was given a prescription for Levaquin at the [facility] emergency room for treatment of a possible sinusitis. She was next seen at the [facility] emergency room yesterday for a blister on her left foot.  The blister was drained and with found to contain purulent material.  She was given a prescription for Keflex which she has not yet picked up from her pharmacy.  She presents today after having contact her office yesterday only within several hours of being released from the emergency room.  She states today that she is being seen here for her headache and for her left foot.  As noted in the nurses note, the patient dressing on her left foot was bloody.  The dressing was changed.  At that time it was noted that there appeared to be a pocket of purulent material with in the blister on her left foot.  She also was noted to have redness in her left leg consistent with cellulitis.  Her vital signs returned  showing a blood pressure of 90/48, pulse of 93, respirations 17, oxygen saturation 98% on room air and a temperature of 99.5.  She was taken to the emergency room for evaluation and management.  On her arrival to the emergency room her temperature was noted to be 101.5.  This patient is seen by Dr. Serena Croissant only for her ITP.  Her most recent CBC completed [facility] on 07/31/2019 returned showing a platelet count of 493,000.  When questioned why she came to the cancer center today for evaluation and management she stated that she was here for her headache and her diabetic foot ulcer.  The patient was educated that she should either return to the emergency room or present to her primary care provider for these issues.  Marga Hoots, MHS, PA-C Physician Assistant.

## 2019-08-02 NOTE — ED Notes (Signed)
Coming from cancer center-has been seen at [facility] ED for other complaints this, came to cancer center for headache-had a DM blister cultured and now having cellulitis at site-needs IV antibiotics and possible admission

## 2019-08-02 NOTE — H&P (Signed)
History and Physical    [name] CWC:[id number] DOB: [birth year] DOA: 08/02/2019  PCP: Vevelyn Francois, NP   Patient coming from: Oncology Office  Chief Complaint  Patient presents with  . Foot Pain     HPI: [name] is a 54 y.o. 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 Patient was seen for blister on the left foot at [facility] ED yesterday after evaluation done and discharged on Keflex as the providers were concerned about chances of getting infection.  Her symptoms started 2 days ago may have started by hitting wheel chair but no obvious trauma. She is WC bound, lives by herself and home care comes to check on her vitals.Patient was seen in the oncology clinic today and sent to the ED for evaluation due to concern for worsening infection.  Patient was recently on Levaquin for sinusitis. Patient otherwise denies any nausea, vomiting, chest pain, shortness of breath, fever,, focal weakness, numbness tingling, speech difficulties. She gets chills every now and then, had headache yesterday and better now.  ED Course: Vitals are stable blood pressure in 140s, febrile one 1.5, heart rate in 90s, XRAY Left Foot" Marked severity diffuse soft tissue swelling and superficial plantar ulceration without evidence of an acute osseous abnormality.  Blood work showed worsening WBC count and renal function and compared to her picture from yesterday she had purulence, pus and red streaking going up on her left leg.  Left leg was tender but no crepitus, had palpable DP and PT pulse.  Blood cultures obtained, started vancomycin Zosyn and hospitalist was consulted for admission.[facility] was [facility] with [facility] and  Dr. Jhonnie Garner is being consulted in the ED.  Review of Systems: All systems were reviewed and were negative except as mentioned in HPI above. Negative for focal weakness Negative for chest pain Negative for shortness of breath  Past Medical History:  Diagnosis Date  . Acute exacerbation of CHF (congestive heart failure) ([place]) 04/11/2019  . Anemia   . Arthritis    "spine, hands" (11/25/2015)  . Back pain    "all my back; probably 3 times/week" (11/25/2015)  . Chronic indwelling Foley catheter 03/04/2017  . Diabetic foot ulcer associated with type 2 diabetes mellitus ([place])   . Hypertension   . Intracerebral hemorrhage ([place]) As a teenager    States she had burst blood vessel as teenager, now with resultant minor visual field and hearing deficits  . Neuropathy   . Stroke [facility]) ~ 1982   "my feeling on my RLE, right lower eye vision,  & hearing out of my right ear not the same since" (11/25/2015)  . Type II diabetes mellitus ([place])     Past Surgical History:  Procedure Laterality Date  . AMPUTATION  11/24/2010   Procedure: AMPUTATION FOOT;  Surgeon: Wylene Simmer, MD;  Location: [place];  Service: Orthopedics;  Laterality: Right;  Right  FOOT TRANS METATARSAL AMPUTATION  . AMPUTATION  07/02/2011   Procedure: AMPUTATION BELOW KNEE;  Surgeon: Wylene Simmer, MD;  Location: [place];  Service: Orthopedics;  Laterality: Right;  . APPLICATION OF WOUND VAC  07/02/2011   Procedure: APPLICATION OF WOUND VAC;  Surgeon: Wylene Simmer, MD;  Location: [place];  Service: Orthopedics;  Laterality: Right;  . BRAIN SURGERY  1980's   Previous brain surgery in [place], [place]  . CESAREAN SECTION  2004; 2006  . COLONOSCOPY N/A 09/17/2015   Procedure: COLONOSCOPY;  Surgeon: Wilford Corner, MD;  Location[facility] ENDOSCOPY;  Service: Endoscopy;  Laterality: N/A;  . ESOPHAGOGASTRODUODENOSCOPY N/A 09/17/2015   Procedure: ESOPHAGOGASTRODUODENOSCOPY (EGD);  Surgeon: Wilford Corner, MD;  Location: [facility] ENDOSCOPY;  Service:  Endoscopy;  Laterality: N/A;  . I & D EXTREMITY  11/24/2010   Procedure: IRRIGATION AND DEBRIDEMENT EXTREMITY;  Surgeon: Wylene Simmer, MD;  Location: [place];  Service: Orthopedics;  Laterality: Left;  Debriedment of left plantar ulcer and trimming of toenails  . I & D EXTREMITY  07/02/2011   Procedure: IRRIGATION AND DEBRIDEMENT EXTREMITY;  Surgeon: Wylene Simmer, MD;  Location: [place];  Service: Orthopedics;  Laterality: Left;  I & D of Left Foot  . I & D EXTREMITY  07/06/2011   Procedure: IRRIGATION AND DEBRIDEMENT EXTREMITY;  Surgeon: Wylene Simmer, MD;  Location: [place];  Service: Orthopedics;  Laterality: Right;  IRRIGATION/DEBRIDEMENT RIGHT BELOW KNEE AMPUTATION  . TUBAL LIGATION  2006     reports that she has never smoked. She has never used smokeless tobacco. She reports current alcohol use of about 2.0 standard drinks of alcohol per week. She reports that she does not use drugs.  No Known Allergies  Family History  Problem Relation Age of Onset  . Diabetes Mother   . Hypertension Mother   . Hyperlipidemia Mother   . Diabetes Father   . Cancer Father        prostate  . Hyperlipidemia Father   . Hypertension Father   . Diabetes Brother   . Peripheral Artery Disease Brother        has had toes amputated  . Diabetes Son      Prior to Admission medications   Medication Sig Start Date End Date Taking? Authorizing Provider  acetaminophen (TYLENOL) 500 MG tablet Take 1,000 mg by mouth every 6 (six) hours as needed for mild pain.    [provider]  amLODipine (NORVASC) 5 MG tablet Take 1 tablet (5 mg total) by mouth daily. 06/29/19 07/29/19  Darliss Cheney, MD  amLODipine (NORVASC) 5 MG tablet Take 5 mg by mouth daily.    [provider]  aspirin EC 81 MG tablet Take 81 mg by mouth daily as needed for moderate pain. Swallow whole.    [provider]  Blood Glucose Monitoring Suppl (PRODIGY AUTOCODE BLOOD GLUCOSE) w/Device KIT 1 kit by Does not apply route in the  morning, at noon, in the evening, and at bedtime. 06/06/19 06/05/20  Vevelyn Francois, NP  cephALEXin (KEFLEX) 500 MG capsule Take 1 capsule (500 mg total) by mouth 2 (two) times daily. 08/01/19   Carlisle Cater, PA-C  furosemide (LASIX) 40 MG tablet Take 1 tablet (40 mg total) by mouth 2 (two) times daily. Patient not taking: Reported on 07/30/2019 04/19/19   Florencia Reasons, MD  gabapentin (NEURONTIN) 400 MG capsule Take 1 capsule (400 mg total) by mouth 2 (two) times daily. 04/19/19   Florencia Reasons, MD  isosorbide mononitrate (IMDUR) 30 MG 24 hr tablet Take 1 tablet (30 mg total) by mouth daily. 06/18/19 12/15/19  Vevelyn Francois, NP  levofloxacin (LEVAQUIN) 500 MG tablet Take 1 tablet (500 mg total) by mouth daily. 07/25/19   Tanner, Lyndon Code., PA-C  metFORMIN (GLUCOPHAGE) 500 MG tablet Take 1 tablet (500 mg total) by mouth 2 (two) times  daily with a meal. Patient taking differently: Take 500 mg by mouth daily with breakfast.  11/13/18 06/26/19  Nita Sells, MD  metFORMIN (GLUCOPHAGE) 500 MG tablet Take 500 mg by mouth 2 (two) times daily with a meal.    [provider]  metoprolol succinate (TOPROL-XL) 50 MG 24 hr tablet Take 1 tablet (50 mg total) by mouth daily. Take with or immediately following a meal. 06/18/19 12/15/19  Vevelyn Francois, NP  ondansetron (ZOFRAN ODT) 4 MG disintegrating tablet Take 1 tablet (4 mg total) by mouth every 8 (eight) hours as needed for nausea or vomiting. 06/26/19   Horton, Barbette Hair, MD  ondansetron (ZOFRAN-ODT) 4 MG disintegrating tablet Take 1 tablet (4 mg total) by mouth every 8 (eight) hours as needed for nausea or vomiting. 06/29/19   Darliss Cheney, MD  rosuvastatin (CRESTOR) 20 MG tablet Take 1 tablet (20 mg total) by mouth daily. 06/18/19 12/15/19  Vevelyn Francois, NP    Physical Exam: Vitals:   08/02/19 1044 08/02/19 1045 08/02/19 1219 08/02/19 1342  BP:   (!) 140/78 (!) 139/74  Pulse:   90 88  Resp:   18 18  Temp:      TempSrc:      SpO2: 99%  99% 99%    Weight:  (!) 95.3 kg    Height:  '5\' 7"'  (1.702 m)      General exam: AAOx3 , NAD, weak appearing. HEENT:Oral mucosa moist, Ear/Nose WNL grossly, dentition normal. Respiratory system: bilaterally clear,no wheezing or crackles,no use of accessory muscle Cardiovascular system: S1 & S2 +, No JVD,. Gastrointestinal system: Abdomen soft, NT,ND, BS+ Nervous System:Alert, awake, moving extremities and grossly nonfocal Extremities:  edemea with erythema, tednerness warmth sensation on left foot up to below knee, area of purulent collection/ as below. callus on foot. Rt BKA distal peripheral pulses palpable on left side.  Skin: No rashes,no icterus. MSK: Normal muscle bulk,tone, power    Labs on Admission: I have personally reviewed following labs and imaging studies  CBC: Recent Labs  Lab 07/30/19 1716 07/31/19 2038 08/02/19 1131  WBC 12.7* 12.3* 18.2*  NEUTROABS 10.0* 10.1* 15.2*  HGB 10.1* 10.2* 10.3*  HCT 33.3* 32.5* 32.8*  MCV 91.2 88.6 89.1  PLT 380 493* 150*   Basic Metabolic Panel: Recent Labs  Lab 07/30/19 1716 07/31/19 2038 08/02/19 1131  NA 133* 131* 132*  K 5.2* 5.0 4.3  CL 101 100 99  CO2 22 21* 22  GLUCOSE 128* 161* 149*  BUN 55* 58* 72*  CREATININE 1.82* 1.86* 2.36*  CALCIUM 8.4* 8.5* 8.5*   GFR: Estimated Creatinine Clearance: 33.1 mL/min (A) (by C-G formula based on SCr of 2.36 mg/dL (H)). Liver Function Tests: Recent Labs  Lab 07/30/19 1716 07/31/19 2038 08/02/19 1131  AST '31 23 16  ' ALT '13 12 9  ' ALKPHOS 59 62 62  BILITOT 1.2 0.6 0.3  PROT 8.8* 8.3* 8.1  ALBUMIN 2.9* 2.5* 2.7*   No results for input(s): LIPASE, AMYLASE in the last 168 hours. No results for input(s): AMMONIA in the last 168 hours. Coagulation Profile: Recent Labs  Lab 07/30/19 1716  INR 1.3*   Cardiac Enzymes: No results for input(s): CKTOTAL, CKMB, CKMBINDEX, TROPONINI in the last 168 hours. BNP (last 3 results) No results for input(s): PROBNP in the last 8760  hours. HbA1C: No results for input(s): HGBA1C in the last 72 hours. CBG: Recent Labs  Lab 07/30/19 1627 07/31/19 2020  GLUCAP 145* 154*   Lipid Profile: No  results for input(s): CHOL, HDL, LDLCALC, TRIG, CHOLHDL, LDLDIRECT in the last 72 hours. Thyroid Function Tests: No results for input(s): TSH, T4TOTAL, FREET4, T3FREE, THYROIDAB in the last 72 hours. Anemia Panel: No results for input(s): VITAMINB12, FOLATE, FERRITIN, TIBC, IRON, RETICCTPCT in the last 72 hours. Urine analysis:    Component Value Date/Time   COLORURINE YELLOW 07/30/2019 2232   APPEARANCEUR CLEAR 07/30/2019 2232   LABSPEC 1.019 07/30/2019 2232   PHURINE 5.0 07/30/2019 2232   GLUCOSEU NEGATIVE 07/30/2019 2232   HGBUR NEGATIVE 07/30/2019 2232   [place] NEGATIVE 07/30/2019 2232   KETONESUR NEGATIVE 07/30/2019 2232   PROTEINUR 100 (A) 07/30/2019 2232   UROBILINOGEN 2.0 (H) 07/08/2011 0051   NITRITE NEGATIVE 07/30/2019 2232   LEUKOCYTESUR NEGATIVE 07/30/2019 2232    Radiological Exams on Admission: DG Foot Complete Left  Result Date: 08/02/2019 CLINICAL DATA:  Plantar ulceration. EXAM: LEFT FOOT - COMPLETE 3+ VIEW COMPARISON:  None. FINDINGS: There is no evidence of fracture or dislocation. There is no evidence of arthropathy or other focal bone abnormality. There is mild to moderate severity vascular calcification. Marked severity diffuse soft tissue swelling is seen. A 1.8 cm superficial soft tissue defect is seen along the plantar aspect of the distal left foot. IMPRESSION: Marked severity diffuse soft tissue swelling and superficial plantar ulceration without evidence of an acute osseous abnormality. Electronically Signed   By: Virgina Norfolk M.D.   On: 08/02/2019 12:06    Assessment/Plan  Cellulitis of Left foot/ suspected sepsis POA with leukocytosis, fever and heart rate more than 90 but hemodynamically stable normal lactic acid.  X-ray with diffuse soft tissue swelling and superficial plantar  ulceration no osseous abnormality.  Continue broad-spectrum antibiotics: VANCOMYCIN  & ZOSYN monitor closely in progressive unit, monitor neurovascualr in legs q shits, orthopedic ( Dr Lucia Gaskins) was consulted by EDP, for close follow-up in case she needs debridement.  follow blood culture. Addendum Dr. Lucia Gaskins had seen the patient in ED- did bedside debridement irrigation with normal saline Xeroform dressing was placed and packed with gauze.  AKI on CKD stage IIIb baseline creatinine recently 1.8.  Suspecting due to #1 in the setting of medication use at home including Lasix. Hold lasix. keep on gentle IV hydration. Monitor renal function closely.  Monitor urine output.  Essential hypertension on amlodipine, Lasix, Imdur, metoprolol.  Blood pressure in 130s to 140s, will resume metoprolol and Imdur and hold Lasix and amlodipine at this time.  Diabetes mellitus with neuropathy. , last hba1c stale  5.5 on 04/12/19 hold home Metformin due to AKI and cellulitis.  Add sliding scale insulin.   Peripheral vascular disease, unspecified /S/P Rt BKA hx-continue home aspirin.  Hx of Idiopathic thrombocytopenic purpura, on IVIG treatment. Plt on 7/14 were in 22k. Stable now.  Monitor platelet count closely.  CHF chronic, diastolic: Hold her Lasix due to AKI.  Hyponatremia continue gentle IV fluids.  history of neurogenic bladder.  Body mass index is 32.89 kg/m.   Severity of Illness:  * I certify that at the point of admission it is my clinical judgment that the patient will require inpatient hospital care spanning beyond 2 midnights from the point of admission due to high intensity of service, high risk for further deterioration and high frequency of surveillance required due to worsening left extremity cellulitis in the setting of diabetes*   DVT prophylaxis: lOVENOX- monitor platelet counts. Code Status:   Code Status: Full Code  Family Communication: Admission, patients condition and plan of care  including  tests being ordered have been discussed with the patient who indicate understanding and agree with the plan and Code Status.  Consults called:  Ortho being contacted by EDP  Antonieta Pert MD [facility]  If 7PM-7AM, please contact night-coverage [email or web address]  08/02/2019, 2:47 PM

## 2019-08-03 LAB — GLUCOSE, CAPILLARY
Glucose-Capillary: 125 mg/dL — ABNORMAL HIGH (ref 70–99)
Glucose-Capillary: 160 mg/dL — ABNORMAL HIGH (ref 70–99)
Glucose-Capillary: 144 mg/dL — ABNORMAL HIGH (ref 70–99)
Glucose-Capillary: 139 mg/dL — ABNORMAL HIGH (ref 70–99)

## 2019-08-03 LAB — CBC
HCT: 28.6 % — ABNORMAL LOW (ref 36.0–46.0)
Hemoglobin: 8.8 g/dL — ABNORMAL LOW (ref 12.0–15.0)
MCH: 27.9 pg (ref 26.0–34.0)
MCHC: 30.8 g/dL (ref 30.0–36.0)
MCV: 90.8 fL (ref 80.0–100.0)
Platelets: 408 10*3/uL — ABNORMAL HIGH (ref 150–400)
RBC: 3.15 MIL/uL — ABNORMAL LOW (ref 3.87–5.11)
RDW: 19.7 % — ABNORMAL HIGH (ref 11.5–15.5)
WBC: 17.6 10*3/uL — ABNORMAL HIGH (ref 4.0–10.5)
nRBC: 0 % (ref 0.0–0.2)

## 2019-08-03 LAB — COMPREHENSIVE METABOLIC PANEL
ALT: 7 U/L (ref 0–44)
AST: 14 U/L — ABNORMAL LOW (ref 15–41)
Albumin: 2.3 g/dL — ABNORMAL LOW (ref 3.5–5.0)
Alkaline Phosphatase: 51 U/L (ref 38–126)
Anion gap: 9 (ref 5–15)
BUN: 68 mg/dL — ABNORMAL HIGH (ref 6–20)
CO2: 19 mmol/L — ABNORMAL LOW (ref 22–32)
Calcium: 7.9 mg/dL — ABNORMAL LOW (ref 8.9–10.3)
Chloride: 105 mmol/L (ref 98–111)
Creatinine, Ser: 2.2 mg/dL — ABNORMAL HIGH (ref 0.44–1.00)
GFR calc Af Amer: 29 mL/min — ABNORMAL LOW (ref >60)
GFR calc non Af Amer: 25 mL/min — ABNORMAL LOW (ref >60)
Glucose, Bld: 118 mg/dL — ABNORMAL HIGH (ref 70–99)
Potassium: 3.8 mmol/L (ref 3.5–5.1)
Sodium: 133 mmol/L — ABNORMAL LOW (ref 135–145)
Total Bilirubin: 0.6 mg/dL (ref 0.3–1.2)
Total Protein: 6.8 g/dL (ref 6.5–8.1)

## 2019-08-03 MED ORDER — METRONIDAZOLE IN NACL 5-0.79 MG/ML-% IV SOLN
500.0000 mg | Freq: Three times a day (TID) | INTRAVENOUS | Status: DC
  Administered 2019-08-03 – 2019-08-08 (×15): 500 mg via INTRAVENOUS
  Filled 2019-08-03 (×15): qty 100

## 2019-08-03 MED ORDER — SODIUM CHLORIDE 0.9 % IV SOLN
2.0000 g | Freq: Two times a day (BID) | INTRAVENOUS | Status: DC
  Administered 2019-08-03 – 2019-08-05 (×5): 2 g via INTRAVENOUS
  Filled 2019-08-03 (×5): qty 2

## 2019-08-03 MED ORDER — SODIUM BICARBONATE 650 MG PO TABS
650.0000 mg | ORAL_TABLET | Freq: Two times a day (BID) | ORAL | Status: DC
  Administered 2019-08-03 – 2019-08-04 (×4): 650 mg via ORAL
  Filled 2019-08-03 (×4): qty 1

## 2019-08-03 NOTE — Progress Notes (Signed)
PROGRESS NOTE    [name]  ZES:[id number] DOB: 01-28-1965 DOA: 08/02/2019 PCP: Barbette Merino, NP   Brief Narrative:  [name] is a 54 y.o. 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 Patient was seen for blister on the left foot at [facility] ED yesterday after evaluation done and discharged on Keflex as the providers were concerned about chances of getting infection.  Her symptoms started 2 days ago may have started by hitting wheel chair but no obvious trauma. She is WC bound, lives by herself and home care comes to check on her vitals.Patient was seen in the oncology clinic today and sent to the ED for evaluation due to concern for worsening infection.   ED Course: Vitals are stable,  XRAY Left Foot" Marked severity diffuse soft tissue swelling and superficial plantar ulceration without evidence of an acute osseous abnormality.   Admitted for left lower extremity cellulitis and started on vancomycin and Zosyn.  Orthopedics was consulted.  She underwent minor debridement in the ED.  Assessment & Plan:   Principal Problem:   Cellulitis of foot Active Problems:   Essential hypertension   Diabetes mellitus (HCC)   Acute kidney injury superimposed on chronic kidney disease (HCC)   Peripheral vascular disease, unspecified (HCC)   S/P BKA (below knee amputation) unilateral (HCC)   Neurogenic bladder   Diabetic peripheral neuropathy (HCC)   Status post below knee amputation of right lower extremity (HCC)   Idiopathic thrombocytopenic purpura (ITP) (HCC)   CHF (congestive heart failure) (HCC)   Cellulitis of Left foot/ suspected sepsis POA with leukocytosis, fever and heart rate more than 90  but hemodynamically stable normal lactic acid.  X-ray with diffuse soft tissue swelling and superficial plantar ulceration,  no osseous abnormality.    Continue broad-spectrum antibiotics: VANCOMYCIN  & ZOSYN,  monitor closely in progressive unit,   monitor neurovascualr in legs q shits, orthopedic ( Dr Susa Simmonds) was consulted by EDP, for close follow-up in case she needs debridement.    follow blood culture.  Dr. Susa Simmonds had seen the patient in ED- did bedside debridement irrigation with normal saline Xeroform dressing was placed and packed with gauze.  AKI on CKD stage IIIb baseline creatinine recently 1.8.  Suspecting due to #1 in the setting of medication use at home including Lasix.  Hold lasix. keep on gentle IV hydration. Monitor renal function closely.  Monitor urine output.  Essential hypertension : Continue  amlodipine, Lasix, Imdur, metoprolol.    Blood pressure in 130s to 140s, will resume metoprolol and Imdur and hold Lasix and amlodipine at this time.  Diabetes mellitus with neuropathy. , last hba1c stale  5.5 on 04/12/19 hold home Metformin due to AKI and cellulitis.  Add sliding scale insulin.   Peripheral vascular disease, unspecified /S/P Rt BKA hx-continue home aspirin.  Hx of Idiopathic thrombocytopenic purpura, on IVIG treatment. Plt on 7/14 were in 22k. Stable now.  Monitor platelet count closely.  CHF chronic, diastolic: Hold her Lasix due to AKI.  Hyponatremia:  continue gentle IV fluids.  history of neurogenic bladder.  Body mass index is 32.89 kg/m.    DVT prophylaxis: Lovenox Code Status:  Full Family Communication:  Discussed with patient, No one at bedside. Disposition  Plan: Anticipated discharge to skilled nursing facility. Patient is not medically clear due to ongoing work-up /infection/duration of antibiotics   Consultants:   orthopaedics.  Procedures:    Antimicrobials:  Anti-infectives (From admission, onward)   Start     Dose/Rate Route  Frequency Ordered Stop   08/03/19 1200  vancomycin (VANCOCIN) IVPB 1000 mg/200 mL premix     Discontinue     1,000 mg 200 mL/hr over 60 Minutes Intravenous Every 24 hours 08/02/19 1124     08/03/19 1200  metroNIDAZOLE (FLAGYL) IVPB 500 mg     Discontinue     500 mg 100 mL/hr over 60 Minutes Intravenous Every 8 hours 08/03/19 1100     08/03/19 1200  ceFEPIme (MAXIPIME) 2 g in sodium chloride 0.9 % 100 mL IVPB     Discontinue     2 g 200 mL/hr over 30 Minutes Intravenous Every 12 hours 08/03/19 1100     08/02/19 2000  piperacillin-tazobactam (ZOSYN) IVPB 3.375 g  Status:  Discontinued        3.375 g 12.5 mL/hr over 240 Minutes Intravenous Every 8 hours 08/02/19 1426 08/03/19 1100   08/02/19 1130  vancomycin (VANCOCIN) IVPB 1000 mg/200 mL premix  Status:  Discontinued        1,000 mg 200 mL/hr over 60 Minutes Intravenous  Once 08/02/19 1115 08/02/19 1118   08/02/19 1130  piperacillin-tazobactam (ZOSYN) IVPB 3.375 g        3.375 g 100 mL/hr over 30 Minutes Intravenous  Once 08/02/19 1115 08/02/19 1842   08/02/19 1130  vancomycin (VANCOREADY) IVPB 2000 mg/400 mL        2,000 mg 200 mL/hr over 120 Minutes Intravenous  Once 08/02/19 1118 08/02/19 1842     Subjective: Patient was seen and examined at bedside.  No overnight event.  She reports feeling better still complains of having pain.  Affected leg appears warm erythematous tender and swollen  Objective: Vitals:   08/02/19 2311 08/03/19 0444 08/03/19 0730 08/03/19 0919  BP: (!) 130/60 (!) 137/59 (!) 136/62 (!) 135/60  Pulse: 90 90 88 88  Resp: 19 19 16    Temp: 99.6 F (37.6 C) 99.8 F (37.7 C) 99.2 F (37.3 C)   TempSrc: Oral Oral Oral   SpO2: 94% 90% 93%   Weight:      Height:        Intake/Output Summary (Last 24 hours) at 08/03/2019 1350 Last data filed at 08/03/2019 1000 Gross per 24 hour  Intake 966.01 ml  Output 1 ml  Net 965.01 ml   Filed Weights   08/02/19 1045  Weight: (!) 95.3 kg    Examination:  General  exam: Appears calm and comfortable  Respiratory system: Clear to auscultation. Respiratory effort normal. Cardiovascular system: S1 & S2 heard, RRR. No JVD, murmurs, rubs, gallops or clicks. No pedal edema. Gastrointestinal system: Abdomen is nondistended, soft and nontender. No organomegaly or masses felt. Normal bowel sounds heard. Central nervous system: Alert and oriented. No focal neurological deficits. Extremities: Right BKA, left leg erythematous,  Warm,  Swollen, tender. Skin: No rashes, lesions or ulcers Psychiatry: Judgement and insight appear normal. Mood & affect appropriate.     Data Reviewed: I have personally reviewed following labs and imaging studies  CBC: Recent Labs  Lab 07/30/19 1716 07/31/19 2038 08/02/19 1131 08/03/19 0434  WBC 12.7* 12.3* 18.2* 17.6*  NEUTROABS 10.0* 10.1* 15.2*  --   HGB 10.1* 10.2* 10.3* 8.8*  HCT 33.3* 32.5* 32.8* 28.6*  MCV  91.2 88.6 89.1 90.8  PLT 380 493* 513* 408*   Basic Metabolic Panel: Recent Labs  Lab 07/30/19 1716 07/31/19 2038 08/02/19 1131 08/03/19 0434  NA 133* 131* 132* 133*  K 5.2* 5.0 4.3 3.8  CL 101 100 99 105  CO2 22 21* 22 19*  GLUCOSE 128* 161* 149* 118*  BUN 55* 58* 72* 68*  CREATININE 1.82* 1.86* 2.36* 2.20*  CALCIUM 8.4* 8.5* 8.5* 7.9*   GFR: Estimated Creatinine Clearance: 35.5 mL/min (A) (by C-G formula based on SCr of 2.2 mg/dL (H)). Liver Function Tests: Recent Labs  Lab 07/30/19 1716 07/31/19 2038 08/02/19 1131 08/03/19 0434  AST 31 23 16  14*  ALT 13 12 9 7   ALKPHOS 59 62 62 51  BILITOT 1.2 0.6 0.3 0.6  PROT 8.8* 8.3* 8.1 6.8  ALBUMIN 2.9* 2.5* 2.7* 2.3*   No results for input(s): LIPASE, AMYLASE in the last 168 hours. No results for input(s): AMMONIA in the last 168 hours. Coagulation Profile: Recent Labs  Lab 07/30/19 1716  INR 1.3*   Cardiac Enzymes: No results for input(s): CKTOTAL, CKMB, CKMBINDEX, TROPONINI in the last 168 hours. BNP (last 3 results) No results for  input(s): PROBNP in the last 8760 hours. HbA1C: Recent Labs    08/02/19 1444  HGBA1C 6.6*   CBG: Recent Labs  Lab 07/31/19 2020 08/02/19 1749 08/02/19 2016 08/03/19 0740 08/03/19 1133  GLUCAP 154* 135* 124* 125* 160*   Lipid Profile: No results for input(s): CHOL, HDL, LDLCALC, TRIG, CHOLHDL, LDLDIRECT in the last 72 hours. Thyroid Function Tests: No results for input(s): TSH, T4TOTAL, FREET4, T3FREE, THYROIDAB in the last 72 hours. Anemia Panel: No results for input(s): VITAMINB12, FOLATE, FERRITIN, TIBC, IRON, RETICCTPCT in the last 72 hours. Sepsis Labs: Recent Labs  Lab 07/30/19 1717 08/02/19 1131  LATICACIDVEN 1.7 1.3    Recent Results (from the past 240 hour(s))  Resp Panel by RT PCR (RSV, Flu A&B, Covid) - Nasopharyngeal Swab     Status: None   Collection Time: 07/25/19 10:47 AM   Specimen: Nasopharyngeal Swab  Result Value Ref Range Status   SARS Coronavirus 2 by RT PCR NEGATIVE NEGATIVE Final    Comment: (NOTE) SARS-CoV-2 target nucleic acids are NOT DETECTED.  The SARS-CoV-2 RNA is generally detectable in upper respiratoy specimens during the acute phase of infection. The lowest concentration of SARS-CoV-2 viral copies this assay can detect is 131 copies/mL. A negative result does not preclude SARS-Cov-2 infection and should not be used as the sole basis for treatment or other patient management decisions. A negative result may occur with  improper specimen collection/handling, submission of specimen other than nasopharyngeal swab, presence of viral mutation(s) within the areas targeted by this assay, and inadequate number of viral copies (<131 copies/mL). A negative result must be combined with clinical observations, patient history, and epidemiological information. The expected result is Negative.  Fact Sheet for Patients:  https://www.moore.com/https://www.fda.gov/media/142436/download  Fact Sheet for Healthcare Providers:   https://www.young.biz/https://www.fda.gov/media/142435/download  This test is no t yet approved or cleared by the [place] FDA and  has been authorized for detection and/or diagnosis of SARS-CoV-2 by FDA under an Emergency Use Authorization (EUA). This EUA will remain  in effect (meaning this test can be used) for the duration of the COVID-19 declaration under Section 564(b)(1) of the Act, 21 U.S.C. section 360bbb-3(b)(1), unless the authorization is terminated or revoked sooner.     Influenza A by PCR NEGATIVE NEGATIVE Final   Influenza B by PCR NEGATIVE NEGATIVE  Final    Comment: (NOTE) The Xpert Xpress SARS-CoV-2/FLU/RSV assay is intended as an aid in  the diagnosis of influenza from Nasopharyngeal swab specimens and  should not be used as a sole basis for treatment. Nasal washings and  aspirates are unacceptable for Xpert Xpress SARS-CoV-2/FLU/RSV  testing.  Fact Sheet for Patients: https://www.moore.com/  Fact Sheet for Healthcare Providers: https://www.young.biz/  This test is not yet approved or cleared by the [place] FDA and  has been authorized for detection and/or diagnosis of SARS-CoV-2 by  FDA under an Emergency Use Authorization (EUA). This EUA will remain  in effect (meaning this test can be used) for the duration of the  Covid-19 declaration under Section 564(b)(1) of the Act, 21  U.S.C. section 360bbb-3(b)(1), unless the authorization is  terminated or revoked.    Respiratory Syncytial Virus by PCR NEGATIVE NEGATIVE Final    Comment: (NOTE) Fact Sheet for Patients: https://www.moore.com/  Fact Sheet for Healthcare Providers: https://www.young.biz/  This test is not yet approved or cleared by the [place] FDA and  has been authorized for detection and/or diagnosis of SARS-CoV-2 by  FDA under an Emergency Use Authorization (EUA). This EUA will remain  in effect (meaning this test can be  used) for the duration of the  COVID-19 declaration under Section 564(b)(1) of the Act, 21 U.S.C.  section 360bbb-3(b)(1), unless the authorization is terminated or  revoked. Performed at [facility], [address], [place], [place] [address]   Culture, Blood     Status: None   Collection Time: 07/25/19 10:59 AM   Specimen: BLOOD LEFT HAND  Result Value Ref Range Status   Specimen Description   Final    BLOOD LEFT HAND Performed at [facility], [address], [place], [place] [address]    Special Requests   Final    NONE Performed at [facility], [address] [place], [place] [address]    Culture   Final    NO GROWTH 5 DAYS Performed at [facility], [address], [place], [place] [address]    Report Status 07/30/2019 FINAL  Final  Culture, Blood     Status: None   Collection Time: 07/25/19 11:01 AM   Specimen: BLOOD LEFT ARM  Result Value Ref Range Status   Specimen Description   Final    BLOOD LEFT ARM Performed at [facility], [address], [place], [place] [address]    Special Requests   Final    NONE Performed at [facility], [address] [place], [place] [address]    Culture   Final    NO GROWTH 5 DAYS Performed at [facility], [address] [place], [place] [address]    Report Status 07/30/2019 FINAL  Final  Urine Culture     Status: Abnormal   Collection Time: 07/25/19  5:42 PM   Specimen: Urine, Random  Result Value Ref Range Status   Specimen Description   Final    URINE, RANDOM Performed at [facility], [address], [place], [place] [address]    Special Requests   Final    NONE Performed at [facility], [address], [place], [place] [address]    Culture 20,000 COLONIES/mL STAPHYLOCOCCUS EPIDERMIDIS (A)  Final   Report Status 07/27/2019 FINAL  Final    Organism ID, Bacteria STAPHYLOCOCCUS EPIDERMIDIS (A)  Final  Susceptibility   Staphylococcus epidermidis - MIC*    CIPROFLOXACIN <=0.5 SENSITIVE Sensitive     GENTAMICIN <=0.5 SENSITIVE Sensitive     NITROFURANTOIN <=16 SENSITIVE Sensitive     OXACILLIN <=0.25 SENSITIVE Sensitive     TETRACYCLINE >=16 RESISTANT Resistant     VANCOMYCIN 1 SENSITIVE Sensitive     TRIMETH/SULFA <=10 SENSITIVE Sensitive     CLINDAMYCIN <=0.25 SENSITIVE Sensitive     RIFAMPIN <=0.5 SENSITIVE Sensitive     Inducible Clindamycin NEGATIVE Sensitive     * 20,000 COLONIES/mL STAPHYLOCOCCUS EPIDERMIDIS  Culture, blood (routine x 2)     Status: None (Preliminary result)   Collection Time: 07/30/19  5:17 PM   Specimen: BLOOD RIGHT HAND  Result Value Ref Range Status   Specimen Description   Final    BLOOD RIGHT HAND Performed at [facility], [address], [place], [place] [address]    Special Requests   Final    BOTTLES DRAWN AEROBIC AND ANAEROBIC Blood Culture results may not be optimal due to an inadequate volume of blood received in culture bottles Performed at [facility], [address] [place], [place] [address]    Culture   Final    NO GROWTH 3 DAYS Performed at [facility], [address], [place], [place] [address]    Report Status PENDING  Incomplete  Culture, blood (routine x 2)     Status: None (Preliminary result)   Collection Time: 07/30/19  5:22 PM   Specimen: BLOOD  Result Value Ref Range Status   Specimen Description   Final    BLOOD LEFT ANTECUBITAL Performed at [facility], [address], [place], [place] [address]    Special Requests   Final    BOTTLES DRAWN AEROBIC AND ANAEROBIC Blood Culture adequate volume Performed at [facility], [address] [place], [place] [address]    Culture   Final    NO GROWTH 2 DAYS Performed at [facility], [address], [place], [place] [address]    Report  Status PENDING  Incomplete  Respiratory Panel by RT PCR (Flu A&B, Covid) - Nasopharyngeal Swab     Status: None   Collection Time: 07/30/19  6:08 PM   Specimen: Nasopharyngeal Swab  Result Value Ref Range Status   SARS Coronavirus 2 by RT PCR NEGATIVE NEGATIVE Final    Comment: (NOTE) SARS-CoV-2 target nucleic acids are NOT DETECTED.  The SARS-CoV-2 RNA is generally detectable in upper respiratoy specimens during the acute phase of infection. The lowest concentration of SARS-CoV-2 viral copies this assay can detect is 131 copies/mL. A negative result does not preclude SARS-Cov-2 infection and should not be used as the sole basis for treatment or other patient management decisions. A negative result may occur with  improper specimen collection/handling, submission of specimen other than nasopharyngeal swab, presence of viral mutation(s) within the areas targeted by this assay, and inadequate number of viral copies (<131 copies/mL). A negative result must be combined with clinical observations, patient history, and epidemiological information. The expected result is Negative.  Fact Sheet for Patients:  https://www.moore.com/  Fact Sheet for Healthcare Providers:  https://www.young.biz/  This test is no t yet approved or cleared by the [place] FDA and  has been authorized for detection and/or diagnosis of SARS-CoV-2 by FDA under an Emergency Use Authorization (EUA). This EUA will remain  in effect (meaning this test can be used) for the duration of  the COVID-19 declaration under Section 564(b)(1) of the Act, 21 U.S.C. section 360bbb-3(b)(1), unless the authorization is terminated or revoked sooner.     Influenza A by PCR NEGATIVE NEGATIVE Final   Influenza B by PCR NEGATIVE NEGATIVE Final    Comment: (NOTE) The Xpert Xpress SARS-CoV-2/FLU/RSV assay is intended as an aid in  the diagnosis of influenza from Nasopharyngeal swab specimens  and  should not be used as a sole basis for treatment. Nasal washings and  aspirates are unacceptable for Xpert Xpress SARS-CoV-2/FLU/RSV  testing.  Fact Sheet for Patients: https://www.moore.com/  Fact Sheet for Healthcare Providers: https://www.young.biz/  This test is not yet approved or cleared by the [place] FDA and  has been authorized for detection and/or diagnosis of SARS-CoV-2 by  FDA under an Emergency Use Authorization (EUA). This EUA will remain  in effect (meaning this test can be used) for the duration of the  Covid-19 declaration under Section 564(b)(1) of the Act, 21  U.S.C. section 360bbb-3(b)(1), unless the authorization is  terminated or revoked. Performed at [facility], [address], [place], [place] [address]   Urine culture     Status: None   Collection Time: 07/30/19 10:32 PM   Specimen: Urine, Clean Catch  Result Value Ref Range Status   Specimen Description   Final    URINE, CLEAN CATCH Performed at [facility], [address], [place], [place] [address]    Special Requests   Final    NONE Performed at [facility], [address], [place], [place] [address]    Culture   Final    NO GROWTH Performed at [facility], [address] [place], [place] [address]    Report Status 08/01/2019 FINAL  Final  SARS Coronavirus 2 by RT PCR (hospital order, performed in [facility] hospital lab) Nasopharyngeal Nasopharyngeal Swab     Status: None   Collection Time: 08/02/19 11:09 AM   Specimen: Nasopharyngeal Swab  Result Value Ref Range Status   SARS Coronavirus 2 NEGATIVE NEGATIVE Final    Comment: (NOTE) SARS-CoV-2 target nucleic acids are NOT DETECTED.  The SARS-CoV-2 RNA is generally detectable in upper and lower respiratory specimens during the acute phase of infection. The lowest concentration of SARS-CoV-2 viral copies this assay can  detect is 250 copies / mL. A negative result does not preclude SARS-CoV-2 infection and should not be used as the sole basis for treatment or other patient management decisions.  A negative result may occur with improper specimen collection / handling, submission of specimen other than nasopharyngeal swab, presence of viral mutation(s) within the areas targeted by this assay, and inadequate number of viral copies (<250 copies / mL). A negative result must be combined with clinical observations, patient history, and epidemiological information.  Fact Sheet for Patients:   BoilerBrush.com.cy  Fact Sheet for Healthcare Providers: https://pope.com/  This test is not yet approved or  cleared by the [place] FDA and has been authorized for detection and/or diagnosis of SARS-CoV-2 by FDA under an Emergency Use Authorization (EUA).  This EUA will remain in effect (meaning this test can be used) for the duration of the COVID-19 declaration under Section 564(b)(1) of the Act, 21 U.S.C. section 360bbb-3(b)(1), unless the authorization is terminated or revoked sooner.  Performed at [facility], [address], [place], [place] [address]      Radiology Studies: DG Foot Complete Left  Result Date: 08/02/2019 CLINICAL DATA:  Plantar ulceration. EXAM: LEFT FOOT - COMPLETE 3+ VIEW COMPARISON:  None. FINDINGS: There is no evidence of fracture or dislocation. There is no evidence of arthropathy or other focal bone abnormality. There is mild to moderate severity vascular calcification. Marked severity diffuse soft tissue swelling is seen. A 1.8 cm superficial soft tissue defect is seen along the plantar aspect of the distal left foot. IMPRESSION: Marked severity diffuse soft tissue swelling and superficial plantar ulceration without evidence of an acute osseous abnormality. Electronically Signed   By: Aram Candela M.D.   On:  08/02/2019 12:06    Scheduled Meds: . enoxaparin (LOVENOX) injection  40 mg Subcutaneous Q24H  . gabapentin  400 mg Oral BID  . insulin aspart  0-9 Units Subcutaneous TID WC  . isosorbide mononitrate  30 mg Oral Daily  . metoprolol succinate  50 mg Oral Daily  . rosuvastatin  20 mg Oral Daily  . sodium bicarbonate  650 mg Oral BID   Continuous Infusions: . sodium chloride 100 mL/hr at 08/03/19 0643  . ceFEPime (MAXIPIME) IV 2 g (08/03/19 1204)  . metronidazole 500 mg (08/03/19 1118)  . vancomycin 1,000 mg (08/03/19 1114)     LOS: 1 day    Time spent: 25 mins.   Cipriano Bunker, MD [facility]   If 7PM-7AM, please contact night-coverage

## 2019-08-04 LAB — BASIC METABOLIC PANEL
Anion gap: 11 (ref 5–15)
BUN: 69 mg/dL — ABNORMAL HIGH (ref 6–20)
CO2: 17 mmol/L — ABNORMAL LOW (ref 22–32)
Calcium: 8 mg/dL — ABNORMAL LOW (ref 8.9–10.3)
Chloride: 109 mmol/L (ref 98–111)
Creatinine, Ser: 2 mg/dL — ABNORMAL HIGH (ref 0.44–1.00)
GFR calc Af Amer: 32 mL/min — ABNORMAL LOW (ref >60)
GFR calc non Af Amer: 28 mL/min — ABNORMAL LOW (ref >60)
Glucose, Bld: 134 mg/dL — ABNORMAL HIGH (ref 70–99)
Potassium: 3.7 mmol/L (ref 3.5–5.1)
Sodium: 137 mmol/L (ref 135–145)

## 2019-08-04 LAB — CBC
HCT: 28.7 % — ABNORMAL LOW (ref 36.0–46.0)
Hemoglobin: 9 g/dL — ABNORMAL LOW (ref 12.0–15.0)
MCH: 28 pg (ref 26.0–34.0)
MCHC: 31.4 g/dL (ref 30.0–36.0)
MCV: 89.4 fL (ref 80.0–100.0)
Platelets: 402 10*3/uL — ABNORMAL HIGH (ref 150–400)
RBC: 3.21 MIL/uL — ABNORMAL LOW (ref 3.87–5.11)
RDW: 19.8 % — ABNORMAL HIGH (ref 11.5–15.5)
WBC: 17.8 10*3/uL — ABNORMAL HIGH (ref 4.0–10.5)
nRBC: 0 % (ref 0.0–0.2)

## 2019-08-04 LAB — C DIFFICILE QUICK SCREEN W PCR REFLEX
C Diff antigen: NEGATIVE
C Diff interpretation: NOT DETECTED
C Diff toxin: NEGATIVE

## 2019-08-04 LAB — CULTURE, BLOOD (ROUTINE X 2): Culture: NO GROWTH

## 2019-08-04 LAB — GLUCOSE, CAPILLARY
Glucose-Capillary: 140 mg/dL — ABNORMAL HIGH (ref 70–99)
Glucose-Capillary: 118 mg/dL — ABNORMAL HIGH (ref 70–99)
Glucose-Capillary: 143 mg/dL — ABNORMAL HIGH (ref 70–99)
Glucose-Capillary: 155 mg/dL — ABNORMAL HIGH (ref 70–99)

## 2019-08-04 LAB — MAGNESIUM: Magnesium: 1.8 mg/dL (ref 1.7–2.4)

## 2019-08-04 LAB — PHOSPHORUS: Phosphorus: 4.3 mg/dL (ref 2.5–4.6)

## 2019-08-04 MED ORDER — LOPERAMIDE HCL 2 MG PO CAPS
2.0000 mg | ORAL_CAPSULE | Freq: Once | ORAL | Status: AC
  Administered 2019-08-04: 2 mg via ORAL
  Filled 2019-08-04: qty 1

## 2019-08-04 NOTE — Progress Notes (Signed)
PROGRESS NOTE    [name]  UVO:[id number] DOB: 1965-12-18 DOA: 08/02/2019 PCP: Barbette Merino, NP   Brief Narrative:  [name] is a 54 y.o. 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 Patient was seen for blister on the left foot at [facility] ED yesterday after evaluation done and discharged on Keflex as the providers were concerned about chances of getting infection.  Her symptoms started 2 days ago may have started by hitting wheel chair but no obvious trauma. She is WC bound, lives by herself and home care comes to check on her vitals.Patient was seen in the oncology clinic today and sent to the ED for evaluation due to concern for worsening infection.   ED Course: Vitals are stable,  XRAY Left Foot" Marked severity diffuse soft tissue swelling and superficial plantar ulceration without evidence of an acute osseous abnormality.   Admitted for left lower extremity cellulitis and started on vancomycin and Zosyn.  Orthopedics was consulted.  She underwent minor debridement in the ED.  Assessment & Plan:   Principal Problem:   Cellulitis of foot Active Problems:   Essential hypertension   Diabetes mellitus (HCC)   Acute kidney injury superimposed on chronic kidney disease (HCC)   Peripheral vascular disease, unspecified (HCC)   S/P BKA (below knee amputation) unilateral (HCC)   Neurogenic bladder   Diabetic peripheral neuropathy (HCC)   Status post below knee amputation of right lower extremity (HCC)   Idiopathic thrombocytopenic purpura (ITP) (HCC)   CHF (congestive heart failure) (HCC)   - Cellulitis of Left foot/ suspected sepsis POA with leukocytosis, fever and heart rate more than  90 but hemodynamically stable normal lactic acid.  X-ray with diffuse soft tissue swelling and superficial plantar ulceration,  no osseous abnormality.    Continue broad-spectrum antibiotics: VANCOMYCIN  & ZOSYN,  monitor clinical course.  monitor neurovascualr in legs q shits, orthopedic ( Dr Susa Simmonds) was consulted by EDP, for close follow-up in case she needs debridement.    follow blood culture.  Dr. Susa Simmonds had seen the patient in ED- did bedside debridement irrigation with normal saline Xeroform dressing was placed and packed with gauze.  AKI on CKD stage IIIb baseline creatinine recently 1.8.  Suspecting due to #1 in the setting of medication use at home including Lasix.  Hold lasix. keep on gentle IV hydration. Monitor renal function closely.  Monitor urine output.  Essential hypertension : Continue  amlodipine, Lasix, Imdur, metoprolol.   Blood pressure in 130s to 140s, will resume metoprolol and Imdur and hold Lasix and amlodipine at this time.  Diabetes mellitus with neuropathy. , last hba1c stable  5.5 on 04/12/19. Hold home Metformin due to AKI and cellulitis.  Add sliding scale insulin.   Peripheral vascular disease, unspecified /S/P Rt BKA hx-continue home aspirin.  Hx of Idiopathic thrombocytopenic purpura, on IVIG treatment. Plt on 7/14 were in 22k. Stable now.  Monitor platelet count closely.  CHF chronic, diastolic: Hold her Lasix due to AKI.  Hyponatremia:  continue gentle IV fluids.  history of neurogenic bladder.  Body mass index is 32.89 kg/m.    DVT prophylaxis: Lovenox Code Status:  Full Family Communication:  Discussed with patient, No one at bedside. Disposition Plan: Anticipated discharge  to skilled nursing facility. Patient is not medically clear due to ongoing work-up /infection/duration of antibiotics   Consultants:   orthopaedics.  Procedures:    Antimicrobials:  Anti-infectives (From admission, onward)   Start     Dose/Rate Route Frequency  Ordered Stop   08/03/19 1200  vancomycin (VANCOCIN) IVPB 1000 mg/200 mL premix     Discontinue     1,000 mg 200 mL/hr over 60 Minutes Intravenous Every 24 hours 08/02/19 1124     08/03/19 1200  metroNIDAZOLE (FLAGYL) IVPB 500 mg     Discontinue     500 mg 100 mL/hr over 60 Minutes Intravenous Every 8 hours 08/03/19 1100     08/03/19 1200  ceFEPIme (MAXIPIME) 2 g in sodium chloride 0.9 % 100 mL IVPB     Discontinue     2 g 200 mL/hr over 30 Minutes Intravenous Every 12 hours 08/03/19 1100     08/02/19 2000  piperacillin-tazobactam (ZOSYN) IVPB 3.375 g  Status:  Discontinued        3.375 g 12.5 mL/hr over 240 Minutes Intravenous Every 8 hours 08/02/19 1426 08/03/19 1100   08/02/19 1130  vancomycin (VANCOCIN) IVPB 1000 mg/200 mL premix  Status:  Discontinued        1,000 mg 200 mL/hr over 60 Minutes Intravenous  Once 08/02/19 1115 08/02/19 1118   08/02/19 1130  piperacillin-tazobactam (ZOSYN) IVPB 3.375 g        3.375 g 100 mL/hr over 30 Minutes Intravenous  Once 08/02/19 1115 08/02/19 1842   08/02/19 1130  vancomycin (VANCOREADY) IVPB 2000 mg/400 mL        2,000 mg 200 mL/hr over 120 Minutes Intravenous  Once 08/02/19 1118 08/02/19 1842     Subjective: Patient was seen and examined at bedside.  No overnight events.  She reports feeling better,  still complains of having pain.  Left leg appears slightly swollen,  redness and warmth and pain has improved.  Objective: Vitals:   08/03/19 0919 08/03/19 1351 08/03/19 2037 08/04/19 0511  BP: (!) 135/60 (!) 118/57 (!) 141/57 (!) 145/61  Pulse: 88 85 85 82  Resp:  Temp:  99.5 F (37.5 C) 100 F (37.8 C) 98.5 F (36.9 C)  TempSrc:  Oral Oral   SpO2:  98% 94% 98%  Weight:      Height:        Intake/Output Summary (Last 24 hours) at 08/04/2019 1348 Last data filed at 08/04/2019 1020 Gross per 24 hour  Intake 1254.41 ml  Output 1050 ml  Net 204.41 ml   Filed Weights   08/02/19 1045  Weight: (!) 95.3 kg     Examination:  General exam: Appears calm and comfortable  Respiratory system: Clear to auscultation. Respiratory effort normal. Cardiovascular system: S1 & S2 heard, RRR. No JVD, murmurs, rubs, gallops or clicks. No pedal edema. Gastrointestinal system: Abdomen is nondistended, soft and nontender. No organomegaly or masses felt. Normal bowel sounds heard. Central nervous system: Alert and oriented. No focal neurological deficits. Extremities: Right BKA, left leg erythematous,  Warm,  Swollen, tender. Skin: No rashes, lesions or ulcers Psychiatry: Judgement and insight appear normal. Mood & affect appropriate.     Data Reviewed: I have personally reviewed following labs and imaging studies  CBC: Recent Labs  Lab 07/30/19 1716 07/31/19 2038 08/02/19 1131 08/03/19 0434 08/04/19 0500  WBC 12.7* 12.3* 18.2* 17.6* 17.8*  NEUTROABS 10.0* 10.1* 15.2*  --   --   HGB 10.1* 10.2* 10.3*  8.8* 9.0*  HCT 33.3* 32.5* 32.8* 28.6* 28.7*  MCV 91.2 88.6 89.1 90.8 89.4  PLT 380 493* 513* 408* 402*   Basic Metabolic Panel: Recent Labs  Lab 07/30/19 1716 07/31/19 2038 08/02/19 1131 08/03/19 0434 08/04/19 0500  NA 133* 131* 132* 133* 137  K 5.2* 5.0 4.3 3.8 3.7  CL 101 100 99 105 109  CO2 22 21* 22 19* 17*  GLUCOSE 128* 161* 149* 118* 134*  BUN 55* 58* 72* 68* 69*  CREATININE 1.82* 1.86* 2.36* 2.20* 2.00*  CALCIUM 8.4* 8.5* 8.5* 7.9* 8.0*  MG  --   --   --   --  1.8  PHOS  --   --   --   --  4.3   GFR: Estimated Creatinine Clearance: 39 mL/min (A) (by C-G formula based on SCr of 2 mg/dL (H)). Liver Function Tests: Recent Labs  Lab 07/30/19 1716 07/31/19 2038 08/02/19 1131 08/03/19 0434  AST 31 23 16  14*  ALT 13 12 9 7   ALKPHOS 59 62 62 51  BILITOT 1.2 0.6 0.3 0.6  PROT 8.8* 8.3* 8.1 6.8  ALBUMIN 2.9* 2.5* 2.7* 2.3*   No results for input(s): LIPASE, AMYLASE in the last 168 hours. No results for input(s): AMMONIA in the last 168 hours. Coagulation Profile: Recent Labs   Lab 07/30/19 1716  INR 1.3*   Cardiac Enzymes: No results for input(s): CKTOTAL, CKMB, CKMBINDEX, TROPONINI in the last 168 hours. BNP (last 3 results) No results for input(s): PROBNP in the last 8760 hours. HbA1C: Recent Labs    08/02/19 1444  HGBA1C 6.6*   CBG: Recent Labs  Lab 08/03/19 1133 08/03/19 1621 08/03/19 2039 08/04/19 0727 08/04/19 1130  GLUCAP 160* 144* 139* 140* 118*   Lipid Profile: No results for input(s): CHOL, HDL, LDLCALC, TRIG, CHOLHDL, LDLDIRECT in the last 72 hours. Thyroid Function Tests: No results for input(s): TSH, T4TOTAL, FREET4, T3FREE, THYROIDAB in the last 72 hours. Anemia Panel: No results for input(s): VITAMINB12, FOLATE, FERRITIN, TIBC, IRON, RETICCTPCT in the last 72 hours. Sepsis Labs: Recent Labs  Lab 07/30/19 1717 08/02/19 1131  LATICACIDVEN 1.7 1.3    Recent Results (from the past 240 hour(s))  Urine Culture     Status: Abnormal   Collection Time: 07/25/19  5:42 PM   Specimen: Urine, Random  Result Value Ref Range Status   Specimen Description   Final    URINE, RANDOM Performed at [facility], [address] [place], [place]    Special Requests   Final    NONE Performed at [facility], [address] [place], [place]    Culture 20,000 COLONIES/mL STAPHYLOCOCCUS EPIDERMIDIS (A)  Final   Report Status 07/27/2019 FINAL  Final   Organism ID, Bacteria STAPHYLOCOCCUS EPIDERMIDIS (A)  Final      Susceptibility   Staphylococcus epidermidis - MIC*    CIPROFLOXACIN <=0.5 SENSITIVE Sensitive     GENTAMICIN <=0.5 SENSITIVE Sensitive     NITROFURANTOIN <=16 SENSITIVE Sensitive     OXACILLIN <=0.25 SENSITIVE Sensitive     TETRACYCLINE >=16 RESISTANT Resistant     VANCOMYCIN 1 SENSITIVE Sensitive     TRIMETH/SULFA <=10 SENSITIVE Sensitive     CLINDAMYCIN <=0.25 SENSITIVE Sensitive     RIFAMPIN <=0.5 SENSITIVE Sensitive     Inducible Clindamycin NEGATIVE Sensitive      * 20,000 COLONIES/mL STAPHYLOCOCCUS EPIDERMIDIS  Culture, blood (routine x 2)     Status: None   Collection Time:  07/30/19  5:17 PM   Specimen: BLOOD RIGHT HAND  Result Value Ref Range Status   Specimen Description   Final    BLOOD RIGHT HAND Performed at [facility], [address], [place], [place] [address]    Special Requests   Final    BOTTLES DRAWN AEROBIC AND ANAEROBIC Blood Culture results may not be optimal due to an inadequate volume of blood received in culture bottles Performed at [facility], [address] [place], [place] [address]    Culture   Final    NO GROWTH 5 DAYS Performed at [facility], [address] [place], [place] [address]    Report Status 08/04/2019 FINAL  Final  Culture, blood (routine x 2)     Status: None (Preliminary result)   Collection Time: 07/30/19  5:22 PM   Specimen: BLOOD  Result Value Ref Range Status   Specimen Description   Final    BLOOD LEFT ANTECUBITAL Performed at [facility], [address] [place], [place] [address]    Special Requests   Final    BOTTLES DRAWN AEROBIC AND ANAEROBIC Blood Culture adequate volume Performed at [facility], [address], [place], [place] [address]    Culture   Final    NO GROWTH 4 DAYS Performed at [facility], [address], [place], [place] [address]    Report Status PENDING  Incomplete  Respiratory Panel by RT PCR (Flu A&B, Covid) - Nasopharyngeal Swab     Status: None   Collection Time: 07/30/19  6:08 PM   Specimen: Nasopharyngeal Swab  Result Value Ref Range Status   SARS Coronavirus 2 by RT PCR NEGATIVE NEGATIVE Final    Comment: (NOTE) SARS-CoV-2 target nucleic acids are NOT DETECTED.  The SARS-CoV-2 RNA is generally detectable in upper respiratoy specimens during the acute phase of infection. The lowest concentration of SARS-CoV-2 viral copies this assay can detect is 131 copies/mL. A negative  result does not preclude SARS-Cov-2 infection and should not be used as the sole basis for treatment or other patient management decisions. A negative result may occur with  improper specimen collection/handling, submission of specimen other than nasopharyngeal swab, presence of viral mutation(s) within the areas targeted by this assay, and inadequate number of viral copies (<131 copies/mL). A negative result must be combined with clinical observations, patient history, and epidemiological information. The expected result is Negative.  Fact Sheet for Patients:  https://www.moore.com/  Fact Sheet for Healthcare Providers:  https://www.young.biz/  This test is no t yet approved or cleared by the [place] FDA and  has been authorized for detection and/or diagnosis of SARS-CoV-2 by FDA under an Emergency Use Authorization (EUA). This EUA will remain  in effect (meaning this test can be used) for the duration of the COVID-19 declaration under Section 564(b)(1) of the Act, 21 U.S.C. section 360bbb-3(b)(1), unless the authorization is terminated or revoked sooner.     Influenza A by PCR NEGATIVE NEGATIVE Final   Influenza B by PCR NEGATIVE NEGATIVE Final    Comment: (NOTE) The Xpert Xpress SARS-CoV-2/FLU/RSV assay is intended as an aid in  the diagnosis of influenza from Nasopharyngeal swab specimens and  should not be used as a sole basis for treatment. Nasal washings and  aspirates are unacceptable for Xpert Xpress SARS-CoV-2/FLU/RSV  testing.  Fact Sheet for Patients: https://www.moore.com/  Fact Sheet for Healthcare Providers: https://www.young.biz/  This test is not yet approved or  cleared by the [place] FDA and  has been authorized for detection and/or diagnosis of SARS-CoV-2 by  FDA under an Emergency Use Authorization (EUA). This EUA will remain  in effect (meaning this test can be used)  for the duration of the  Covid-19 declaration under Section 564(b)(1) of the Act, 21  U.S.C. section 360bbb-3(b)(1), unless the authorization is  terminated or revoked. Performed at [facility], [address] [place], [place] [address]   Urine culture     Status: None   Collection Time: 07/30/19 10:32 PM   Specimen: Urine, Clean Catch  Result Value Ref Range Status   Specimen Description   Final    URINE, CLEAN CATCH Performed at [facility], [address] [place], [place] [address]    Special Requests   Final    NONE Performed at [facility], [address] [place], [place] [address]    Culture   Final    NO GROWTH Performed at [facility], [address] [place] [address] [place], [place] [address]    Report Status 08/01/2019 FINAL  Final  SARS Coronavirus 2 by RT PCR (hospital order, performed in [facility] hospital lab) Nasopharyngeal Nasopharyngeal Swab     Status: None   Collection Time: 08/02/19 11:09 AM   Specimen: Nasopharyngeal Swab  Result Value Ref Range Status   SARS Coronavirus 2 NEGATIVE NEGATIVE Final    Comment: (NOTE) SARS-CoV-2 target nucleic acids are NOT DETECTED.  The SARS-CoV-2 RNA is generally detectable in upper and lower respiratory specimens during the acute phase of infection. The lowest concentration of SARS-CoV-2 viral copies this assay can detect is 250 copies / mL. A negative result does not preclude SARS-CoV-2 infection and should not be used as the sole basis for treatment or other patient management decisions.  A negative result may occur with improper specimen collection / handling, submission of specimen other than nasopharyngeal swab, presence of viral mutation(s) within the areas targeted by this assay, and inadequate number of viral copies (<250 copies / mL). A negative result must be combined with clinical observations, patient history, and epidemiological information.  Fact  Sheet for Patients:   BoilerBrush.com.cyhttps://www.fda.gov/media/136312/download  Fact Sheet for Healthcare Providers: https://pope.com/https://www.fda.gov/media/136313/download  This test is not yet approved or  cleared by the [place] FDA and has been authorized for detection and/or diagnosis of SARS-CoV-2 by FDA under an Emergency Use Authorization (EUA).  This EUA will remain in effect (meaning this test can be used) for the duration of the COVID-19 declaration under Section 564(b)(1) of the Act, 21 U.S.C. section 360bbb-3(b)(1), unless the authorization is terminated or revoked sooner.  Performed at [facility], [address] [place], [place] [address]   Blood culture (routine x 2)     Status: None (Preliminary result)   Collection Time: 08/02/19 11:35 AM   Specimen: BLOOD  Result Value Ref Range Status   Specimen Description   Final    BLOOD OUTSIDE SERVICE OPLAB Performed at [facility], [address] [place], [place] [address]    Special Requests   Final    BOTTLES DRAWN AEROBIC AND ANAEROBIC Blood Culture adequate volume Performed at [facility], [address] [place], [place] [address]    Culture   Final    NO GROWTH 2 DAYS Performed at [facility], [address] [place], [place] [address]    Report Status PENDING  Incomplete  Blood culture (  routine x 2)     Status: None (Preliminary result)   Collection Time: 08/02/19 11:35 AM   Specimen: BLOOD  Result Value Ref Range Status   Specimen Description   Final    BLOOD RIGHT ANTECUBITAL Performed at [facility], [address], [place], [place] [address]    Special Requests   Final    BOTTLES DRAWN AEROBIC ONLY Blood Culture adequate volume Performed at [facility], [address] [place], [place] [address]    Culture   Final    NO GROWTH 2 DAYS Performed at [facility], [address] [place], [place] [address]     Report Status PENDING  Incomplete     Radiology Studies: No results found.  Scheduled Meds: . enoxaparin (LOVENOX) injection  40 mg Subcutaneous Q24H  . gabapentin  400 mg Oral BID  . insulin aspart  0-9 Units Subcutaneous TID WC  . isosorbide mononitrate  30 mg Oral Daily  . metoprolol succinate  50 mg Oral Daily  . rosuvastatin  20 mg Oral Daily  . sodium bicarbonate  650 mg Oral BID   Continuous Infusions: . sodium chloride 100 mL/hr at 08/03/19 2332  . ceFEPime (MAXIPIME) IV 2 g (08/04/19 1220)  . metronidazole Stopped (08/04/19 0534)  . vancomycin Stopped (08/03/19 2013)     LOS: 2 days    Time spent: 25 mins.   Cipriano Bunker, MD [facility]   If 7PM-7AM, please contact night-coverage

## 2019-08-05 LAB — CULTURE, BLOOD (ROUTINE X 2)
Culture: NO GROWTH
Special Requests: ADEQUATE

## 2019-08-05 LAB — BASIC METABOLIC PANEL
Anion gap: 11 (ref 5–15)
BUN: 51 mg/dL — ABNORMAL HIGH (ref 6–20)
CO2: 16 mmol/L — ABNORMAL LOW (ref 22–32)
Calcium: 8.1 mg/dL — ABNORMAL LOW (ref 8.9–10.3)
Chloride: 111 mmol/L (ref 98–111)
Creatinine, Ser: 1.53 mg/dL — ABNORMAL HIGH (ref 0.44–1.00)
GFR calc Af Amer: 45 mL/min — ABNORMAL LOW (ref >60)
GFR calc non Af Amer: 39 mL/min — ABNORMAL LOW (ref >60)
Glucose, Bld: 137 mg/dL — ABNORMAL HIGH (ref 70–99)
Potassium: 4.3 mmol/L (ref 3.5–5.1)
Sodium: 138 mmol/L (ref 135–145)

## 2019-08-05 LAB — GLUCOSE, CAPILLARY
Glucose-Capillary: 123 mg/dL — ABNORMAL HIGH (ref 70–99)
Glucose-Capillary: 146 mg/dL — ABNORMAL HIGH (ref 70–99)
Glucose-Capillary: 116 mg/dL — ABNORMAL HIGH (ref 70–99)
Glucose-Capillary: 112 mg/dL — ABNORMAL HIGH (ref 70–99)

## 2019-08-05 LAB — MAGNESIUM: Magnesium: 1.6 mg/dL — ABNORMAL LOW (ref 1.7–2.4)

## 2019-08-05 MED ORDER — MAGNESIUM SULFATE 2 GM/50ML IV SOLN
2.0000 g | Freq: Once | INTRAVENOUS | Status: AC
  Administered 2019-08-05: 2 g via INTRAVENOUS
  Filled 2019-08-05: qty 50

## 2019-08-05 MED ORDER — SODIUM BICARBONATE 650 MG PO TABS
650.0000 mg | ORAL_TABLET | Freq: Three times a day (TID) | ORAL | Status: AC
  Administered 2019-08-05 – 2019-08-06 (×6): 650 mg via ORAL
  Filled 2019-08-05 (×6): qty 1

## 2019-08-05 MED ORDER — SODIUM CHLORIDE 0.9 % IV SOLN
1.0000 g | INTRAVENOUS | Status: DC
  Administered 2019-08-05 – 2019-08-07 (×3): 1 g via INTRAVENOUS
  Filled 2019-08-05 (×3): qty 1

## 2019-08-05 MED ORDER — VANCOMYCIN HCL 1500 MG/300ML IV SOLN: 1500.0000 mg | INTRAVENOUS | Status: DC

## 2019-08-05 NOTE — Progress Notes (Signed)
PROGRESS NOTE    Cheryl HansenMichelle L Wolf  WJX:[id number] DOB: 12/18/1965 DOA: 08/02/2019 PCP: Barbette MerinoKing, Crystal M, NP   Brief Narrative:  Cheryl HansenMichelle L Wolf is a 54 y.o. 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 Patient was seen for blister on the left foot at [facility] yesterday after evaluation done and discharged on Keflex as the providers were concerned about chances of getting infection.  Her symptoms started 2 days ago may have started by hitting wheel chair but no obvious trauma. She is WC bound, lives by herself and home care comes to check on her vitals.Patient was seen in the oncology clinic today and sent to the ED for evaluation due to concern for worsening infection.   ED Course: Vitals are stable,  XRAY Left Foot" Marked severity diffuse soft tissue swelling and superficial plantar ulceration without evidence of an acute osseous abnormality.   Admitted for left lower extremity cellulitis and started on vancomycin and Zosyn.  Orthopedics was consulted.  She underwent minor debridement in the ED.  Assessment & Plan:   Principal Problem:   Cellulitis of foot Active Problems:   Essential hypertension   Diabetes mellitus (HCC)   Acute kidney injury superimposed on chronic kidney disease (HCC)   Peripheral vascular disease, unspecified (HCC)   S/P BKA (below knee amputation) unilateral (HCC)   Neurogenic bladder   Diabetic peripheral neuropathy (HCC)   Status post below knee amputation of right lower extremity (HCC)   Idiopathic thrombocytopenic purpura (ITP) (HCC)   CHF (congestive heart failure) (HCC)   - Cellulitis of Left foot/ suspected sepsis POA with leukocytosis, fever and heart rate more than  90 but hemodynamically stable with normal lactic acid.  X-ray with diffuse soft tissue swelling and superficial plantar ulceration,  no osseous abnormality.    Continue broad-spectrum antibiotics: VANCOMYCIN  & ZOSYN,  monitor clinical course.  monitor neurovascualr in legs q shits, orthopedic ( Dr Susa SimmondsAdair) was consulted by EDP, for close follow-up in case. she needs debridement.     Blood cultures : No growth so far.  Dr. Susa SimmondsAdair had seen the patient in ED- did bedside debridement irrigation with normal saline.  Xeroform dressing was placed and packed with gauze.  AKI on CKD stage IIIb:   Baseline creatinine recently 1.8.  Suspecting due to #1 in the setting of medication use at home including Lasix.  Hold lasix. keep on gentle IV hydration. Monitor renal function closely.  Monitor urine output.  Essential hypertension : Continue  amlodipine, Lasix, Imdur, metoprolol.   Blood pressure in 130s to 140s, Continue metoprolol and Imdur and hold Lasix and amlodipine at this time.  Diabetes mellitus with neuropathy. , last hba1c stable  5.5 on 04/12/19. Hold home Metformin due to AKI and cellulitis.  Add sliding scale insulin.   Peripheral vascular disease, unspecified /S/P Rt BKA hx-continue home aspirin.  Hx of Idiopathic thrombocytopenic purpura, on IVIG treatment. Plt on 7/14 were in 22k. Stable now.  Monitor platelet count closely.  CHF chronic, diastolic: Hold her Lasix due to AKI.  Hyponatremia:  continue gentle IV fluids.  history of neurogenic bladder.  Body mass index is 32.89 kg/m.    DVT prophylaxis: Lovenox Code Status:  Full Family Communication:  Discussed with patient,  No one at bedside. Disposition Plan: Anticipated discharge to skilled nursing facility. Patient is not medically clear due to ongoing work-up /infection/duration of antibiotics.   Consultants:   orthopaedics.  Procedures:    Antimicrobials:  Anti-infectives (From admission, onward)   Start      Dose/Rate Route Frequency Ordered Stop   08/03/19 1200  vancomycin (VANCOCIN) IVPB 1000 mg/200 mL premix     Discontinue     1,000 mg 200 mL/hr over 60 Minutes Intravenous Every 24 hours 08/02/19 1124     08/03/19 1200  metroNIDAZOLE (FLAGYL) IVPB 500 mg     Discontinue     500 mg 100 mL/hr over 60 Minutes Intravenous Every 8 hours 08/03/19 1100     08/03/19 1200  ceFEPIme (MAXIPIME) 2 g in sodium chloride 0.9 % 100 mL IVPB     Discontinue     2 g 200 mL/hr over 30 Minutes Intravenous Every 12 hours 08/03/19 1100     08/02/19 2000  piperacillin-tazobactam (ZOSYN) IVPB 3.375 g  Status:  Discontinued        3.375 g 12.5 mL/hr over 240 Minutes Intravenous Every 8 hours 08/02/19 1426 08/03/19 1100   08/02/19 1130  vancomycin (VANCOCIN) IVPB 1000 mg/200 mL premix  Status:  Discontinued        1,000 mg 200 mL/hr over 60 Minutes Intravenous  Once 08/02/19 1115 08/02/19 1118   08/02/19 1130  piperacillin-tazobactam (ZOSYN) IVPB 3.375 g        3.375 g 100 mL/hr over 30 Minutes Intravenous  Once 08/02/19 1115 08/02/19 1842   08/02/19 1130  vancomycin (VANCOREADY) IVPB 2000 mg/400 mL        2,000 mg 200 mL/hr over 120 Minutes Intravenous  Once 08/02/19 1118 08/02/19 1842     Subjective: Patient was seen and examined at bedside.  No overnight events.  She reports feeling better,  still complains of having pain.  Left leg appears slightly swollen,  redness and warmth and pain has improved. She reports pain is better controlled.  Objective: Vitals:   08/04/19 0511 08/04/19 1446 08/04/19 2003 08/05/19 0417  BP: (!) 145/61 (!) 126/56 (!) 141/67 (!) 146/71  Pulse: 82 73 79 80  Resp: Temp: 98.5 F (36.9 C) 98.5 F (36.9 C) 98.7 F (37.1 C) 98.7 F (37.1 C)  TempSrc:  Oral Oral Oral  SpO2: 98% 95% 99% 93%  Weight:      Height:        Intake/Output Summary (Last 24 hours) at 08/05/2019 1331 Last data filed at 08/05/2019 0839 Gross per 24 hour  Intake 2851.55 ml  Output 1100 ml    Net 1751.55 ml   Filed Weights   08/02/19 1045  Weight: (!) 95.3 kg    Examination:  General exam: Appears calm and comfortable  Respiratory system: Clear to auscultation. Respiratory effort normal. Cardiovascular system: S1 & S2 heard, RRR. No JVD, murmurs, rubs, gallops or clicks. No pedal edema. Gastrointestinal system: Abdomen is nondistended, soft and nontender. No organomegaly or masses felt. Normal bowel sounds heard. Central nervous system: Alert and oriented. No focal neurological deficits. Extremities: Right BKA, left leg erythematous,  Warm,  Swollen, tender. Skin: No rashes, lesions or ulcers Psychiatry: Judgement and insight appear normal. Mood & affect appropriate.     Data Reviewed: I have personally reviewed following labs and imaging studies  CBC: Recent Labs  Lab 07/30/19 1716 07/31/19 2038 08/02/19 1131 08/03/19 0434 08/04/19 0500  WBC 12.7* 12.3* 18.2*  17.6* 17.8*  NEUTROABS 10.0* 10.1* 15.2*  --   --   HGB 10.1* 10.2* 10.3* 8.8* 9.0*  HCT 33.3* 32.5* 32.8* 28.6* 28.7*  MCV 91.2 88.6 89.1 90.8 89.4  PLT 380 493* 513* 408* 402*   Basic Metabolic Panel: Recent Labs  Lab 07/31/19 2038 08/02/19 1131 08/03/19 0434 08/04/19 0500 08/05/19 0507  NA 131* 132* 133* 137 138  K 5.0 4.3 3.8 3.7 4.3  CL 100 99 105 109 111  CO2 21* 22 19* 17* 16*  GLUCOSE 161* 149* 118* 134* 137*  BUN 58* 72* 68* 69* 51*  CREATININE 1.86* 2.36* 2.20* 2.00* 1.53*  CALCIUM 8.5* 8.5* 7.9* 8.0* 8.1*  MG  --   --   --  1.8 1.6*  PHOS  --   --   --  4.3  --    GFR: Estimated Creatinine Clearance: 51 mL/min (A) (by C-G formula based on SCr of 1.53 mg/dL (H)). Liver Function Tests: Recent Labs  Lab 07/30/19 1716 07/31/19 2038 08/02/19 1131 08/03/19 0434  AST 14*  ALT ALKPHOS 59 62 62 51  BILITOT 1.2 0.6 0.3 0.6  PROT 8.8* 8.3* 8.1 6.8  ALBUMIN 2.9* 2.5* 2.7* 2.3*   No results for input(s): LIPASE, AMYLASE in the last 168 hours. No results for  input(s): AMMONIA in the last 168 hours. Coagulation Profile: Recent Labs  Lab 07/30/19 1716  INR 1.3*   Cardiac Enzymes: No results for input(s): CKTOTAL, CKMB, CKMBINDEX, TROPONINI in the last 168 hours. BNP (last 3 results) No results for input(s): PROBNP in the last 8760 hours. HbA1C: Recent Labs    08/02/19 1444  HGBA1C 6.6*   CBG: Recent Labs  Lab 08/04/19 1130 08/04/19 1733 08/04/19 2129 08/05/19 0738 08/05/19 1213  GLUCAP 118* 143* 155* 123* 146*   Lipid Profile: No results for input(s): CHOL, HDL, LDLCALC, TRIG, CHOLHDL, LDLDIRECT in the last 72 hours. Thyroid Function Tests: No results for input(s): TSH, T4TOTAL, FREET4, T3FREE, THYROIDAB in the last 72 hours. Anemia Panel: No results for input(s): VITAMINB12, FOLATE, FERRITIN, TIBC, IRON, RETICCTPCT in the last 72 hours. Sepsis Labs: Recent Labs  Lab 07/30/19 1717 08/02/19 1131  LATICACIDVEN 1.7 1.3    Recent Results (from the past 240 hour(s))  Culture, blood (routine x 2)     Status: None   Collection Time: 07/30/19  5:17 PM   Specimen: BLOOD RIGHT HAND  Result Value Ref Range Status   Specimen Description   Final    BLOOD RIGHT HAND Performed at [facility], [address], [place], [place] [address]    Special Requests   Final    BOTTLES DRAWN AEROBIC AND ANAEROBIC Blood Culture results may not be optimal due to an inadequate volume of blood received in culture bottles Performed at [facility], [address] [place], [place] [address]    Culture   Final    NO GROWTH 5 DAYS Performed at [facility], [address], [place], [place] [address]    Report Status 08/04/2019 FINAL  Final  Culture, blood (routine x 2)     Status: None   Collection Time: 07/30/19  5:22 PM   Specimen: BLOOD  Result Value Ref Range Status   Specimen Description   Final    BLOOD LEFT ANTECUBITAL Performed at [facility], [address] [place], [place]  [address]    Special Requests   Final  BOTTLES DRAWN AEROBIC AND ANAEROBIC Blood Culture adequate volume Performed at [facility], [address] [place], [place] [address]    Culture   Final    NO GROWTH 5 DAYS Performed at [facility], [address], [place], [place] [address]    Report Status 08/05/2019 FINAL  Final  Respiratory Panel by RT PCR (Flu A&B, Covid) - Nasopharyngeal Swab     Status: None   Collection Time: 07/30/19  6:08 PM   Specimen: Nasopharyngeal Swab  Result Value Ref Range Status   SARS Coronavirus 2 by RT PCR NEGATIVE NEGATIVE Final    Comment: (NOTE) SARS-CoV-2 target nucleic acids are NOT DETECTED.  The SARS-CoV-2 RNA is generally detectable in upper respiratoy specimens during the acute phase of infection. The lowest concentration of SARS-CoV-2 viral copies this assay can detect is 131 copies/mL. A negative result does not preclude SARS-Cov-2 infection and should not be used as the sole basis for treatment or other patient management decisions. A negative result may occur with  improper specimen collection/handling, submission of specimen other than nasopharyngeal swab, presence of viral mutation(s) within the areas targeted by this assay, and inadequate number of viral copies (<131 copies/mL). A negative result must be combined with clinical observations, patient history, and epidemiological information. The expected result is Negative.  Fact Sheet for Patients:  https://www.moore.com/  Fact Sheet for Healthcare Providers:  https://www.young.biz/  This test is no t yet approved or cleared by the [place] FDA and  has been authorized for detection and/or diagnosis of SARS-CoV-2 by FDA under an Emergency Use Authorization (EUA). This EUA will remain  in effect (meaning this test can be used) for the duration of the COVID-19 declaration under Section 564(b)(1) of the Act, 21  U.S.C. section 360bbb-3(b)(1), unless the authorization is terminated or revoked sooner.     Influenza A by PCR NEGATIVE NEGATIVE Final   Influenza B by PCR NEGATIVE NEGATIVE Final    Comment: (NOTE) The Xpert Xpress SARS-CoV-2/FLU/RSV assay is intended as an aid in  the diagnosis of influenza from Nasopharyngeal swab specimens and  should not be used as a sole basis for treatment. Nasal washings and  aspirates are unacceptable for Xpert Xpress SARS-CoV-2/FLU/RSV  testing.  Fact Sheet for Patients: https://www.moore.com/  Fact Sheet for Healthcare Providers: https://www.young.biz/  This test is not yet approved or cleared by the [place] FDA and  has been authorized for detection and/or diagnosis of SARS-CoV-2 by  FDA under an Emergency Use Authorization (EUA). This EUA will remain  in effect (meaning this test can be used) for the duration of the  Covid-19 declaration under Section 564(b)(1) of the Act, 21  U.S.C. section 360bbb-3(b)(1), unless the authorization is  terminated or revoked. Performed at [facility], [address], [place], [place] [address]   Urine culture     Status: None   Collection Time: 07/30/19 10:32 PM   Specimen: Urine, Clean Catch  Result Value Ref Range Status   Specimen Description   Final    URINE, CLEAN CATCH Performed at [facility], [address], [place], [place] [address]    Special Requests   Final    NONE Performed at [facility], [address] [place], [place] [address]    Culture   Final    NO GROWTH Performed at [facility], [address], [place], [place] [address]    Report Status 08/01/2019 FINAL  Final  SARS Coronavirus 2 by RT PCR (hospital order, performed in [facility] hospital lab) Nasopharyngeal Nasopharyngeal Swab     Status: None   Collection Time: 08/02/19 11:09 AM   Specimen: Nasopharyngeal Swab  Result  Value Ref Range Status   SARS Coronavirus 2 NEGATIVE NEGATIVE Final    Comment: (NOTE) SARS-CoV-2 target nucleic acids are NOT DETECTED.  The SARS-CoV-2 RNA is generally detectable in upper and lower respiratory specimens during the acute phase of infection. The lowest concentration of SARS-CoV-2 viral copies this assay can detect is 250 copies / mL. A negative result does not preclude SARS-CoV-2 infection and should not be used as the sole basis for treatment or other patient management decisions.  A negative result may occur with improper specimen collection / handling, submission of specimen other than nasopharyngeal swab, presence of viral mutation(s) within the areas targeted by this assay, and inadequate number of viral copies (<250 copies / mL). A negative result must be combined with clinical observations, patient history, and epidemiological information.  Fact Sheet for Patients:   BoilerBrush.com.cy  Fact Sheet for Healthcare Providers: https://pope.com/  This test is not yet approved or  cleared by the [place] FDA and has been authorized for detection and/or diagnosis of SARS-CoV-2 by FDA under an Emergency Use Authorization (EUA).  This EUA will remain in effect (meaning this test can be used) for the duration of the COVID-19 declaration under Section 564(b)(1) of the Act, 21 U.S.C. section 360bbb-3(b)(1), unless the authorization is terminated or revoked sooner.  Performed at [facility], [address], [place], [place] [address]   Blood culture (routine x 2)     Status: None (Preliminary result)   Collection Time: 08/02/19 11:35 AM   Specimen: BLOOD  Result Value Ref Range Status   Specimen Description   Final    BLOOD OUTSIDE SERVICE OPLAB Performed at [facility], [address] [place], [place] [address]    Special Requests   Final    BOTTLES DRAWN AEROBIC AND  ANAEROBIC Blood Culture adequate volume Performed at [facility], [address] [place], [place] [address]    Culture   Final    NO GROWTH 3 DAYS Performed at [facility], [address], [place], [place] [address]    Report Status PENDING  Incomplete  Blood culture (routine x 2)     Status: None (Preliminary result)   Collection Time: 08/02/19 11:35 AM   Specimen: BLOOD  Result Value Ref Range Status   Specimen Description   Final    BLOOD RIGHT ANTECUBITAL Performed at [facility], [address] [place], [place] [address]    Special Requests   Final    BOTTLES DRAWN AEROBIC ONLY Blood Culture adequate volume Performed at [facility], [address], [place], [place] [address]    Culture   Final    NO GROWTH 3 DAYS Performed at [facility], [address], [place], [place] [address]    Report Status PENDING  Incomplete  C Difficile Quick Screen w PCR reflex     Status: None   Collection Time: 08/04/19  1:27 PM   Specimen: STOOL  Result Value Ref Range Status   C Diff antigen NEGATIVE NEGATIVE Final   C Diff toxin NEGATIVE NEGATIVE Final   C Diff interpretation No C. difficile detected.  Final    Comment: Performed at [facility], [address]  [place], [place] [address]     Radiology Studies: No results found.  Scheduled Meds: . enoxaparin (LOVENOX) injection  40 mg Subcutaneous Q24H  . gabapentin  400 mg Oral BID  . insulin aspart  0-9 Units Subcutaneous TID WC  . isosorbide mononitrate  30 mg Oral Daily  . metoprolol succinate  50 mg Oral Daily  . rosuvastatin  20 mg Oral Daily  . sodium bicarbonate  650 mg Oral TID   Continuous Infusions: . sodium chloride 100 mL/hr at 08/05/19 0300  . ceFEPime (MAXIPIME) IV 2 g (08/05/19 1040)  . magnesium sulfate bolus IVPB    . metronidazole 500 mg (08/05/19 0410)  . vancomycin 1,000 mg (08/04/19 1736)     LOS: 3 days     Time spent: 25 mins.   Cipriano Bunker, MD [facility]   If 7PM-7AM, please contact night-coverage

## 2019-08-05 NOTE — Progress Notes (Addendum)
Pharmacy Antibiotic Note  [name] is a 54 y.o. female admitted on 08/02/2019 with  history of idiopathic thrombocytopenia treated with IVIG, CHF, diabetes with neuropathy, status post right below the knee amputation, chronic kidney disease --presents to the ED today for a blister to the left foot, was seen yesterday at [facility] for same complaint.  Pt was given a Rx for abx but was unable to get them.  Pharmacy has been consulted for Vancomycin dosing for cellulitis.  Today, 08/05/2019:  D4 abx  Afebrile since 7/30  WBC remains elevated (did receive Dexamethasone x 1 on 7/26)  SCr improving to near baseline (1.2-1.5)  Cx remain clear  Plan:  Narrow abx to Rocephin 1g IV q24 hr,  Flagyl 500 mg IV q8 hr  Height: 5\' 7"  (170.2 cm) Weight: (!) 95.3 kg (210 lb) IBW/kg (Calculated) : 61.6  Temp (24hrs), Avg:98.6 F (37 C), Min:98.5 F (36.9 C), Max:98.7 F (37.1 C)  Recent Labs  Lab 07/30/19 1716 07/30/19 1716 07/30/19 1717 07/31/19 2038 08/02/19 1131 08/03/19 0434 08/04/19 0500 08/05/19 0507  WBC 12.7*  --   --  12.3* 18.2* 17.6* 17.8*  --   CREATININE 1.82*   < >  --  1.86* 2.36* 2.20* 2.00* 1.53*  LATICACIDVEN  --   --  1.7  --  1.3  --   --   --    < > = values in this interval not displayed.    Estimated Creatinine Clearance: 51 mL/min (A) (by C-G formula based on SCr of 1.53 mg/dL (H)).    No Known Allergies    Thank you for allowing pharmacy to be a part of this patient's care.  10/05/19, PharmD, BCPS [phone] 08/05/2019, 2:03 PM

## 2019-08-06 LAB — CBC
HCT: 27.8 % — ABNORMAL LOW (ref 36.0–46.0)
Hemoglobin: 8.6 g/dL — ABNORMAL LOW (ref 12.0–15.0)
MCH: 27.7 pg (ref 26.0–34.0)
MCHC: 30.9 g/dL (ref 30.0–36.0)
MCV: 89.4 fL (ref 80.0–100.0)
Platelets: 377 10*3/uL (ref 150–400)
RBC: 3.11 MIL/uL — ABNORMAL LOW (ref 3.87–5.11)
RDW: 19.9 % — ABNORMAL HIGH (ref 11.5–15.5)
WBC: 11.1 10*3/uL — ABNORMAL HIGH (ref 4.0–10.5)
nRBC: 0 % (ref 0.0–0.2)

## 2019-08-06 LAB — COMPREHENSIVE METABOLIC PANEL
ALT: 8 U/L (ref 0–44)
AST: 13 U/L — ABNORMAL LOW (ref 15–41)
Albumin: 2.2 g/dL — ABNORMAL LOW (ref 3.5–5.0)
Alkaline Phosphatase: 53 U/L (ref 38–126)
Anion gap: 10 (ref 5–15)
BUN: 34 mg/dL — ABNORMAL HIGH (ref 6–20)
CO2: 19 mmol/L — ABNORMAL LOW (ref 22–32)
Calcium: 8.2 mg/dL — ABNORMAL LOW (ref 8.9–10.3)
Chloride: 110 mmol/L (ref 98–111)
Creatinine, Ser: 1.12 mg/dL — ABNORMAL HIGH (ref 0.44–1.00)
GFR calc Af Amer: >60 mL/min (ref >60)
GFR calc non Af Amer: 56 mL/min — ABNORMAL LOW (ref >60)
Glucose, Bld: 124 mg/dL — ABNORMAL HIGH (ref 70–99)
Potassium: 3.8 mmol/L (ref 3.5–5.1)
Sodium: 139 mmol/L (ref 135–145)
Total Bilirubin: 0.4 mg/dL (ref 0.3–1.2)
Total Protein: 7 g/dL (ref 6.5–8.1)

## 2019-08-06 LAB — GLUCOSE, CAPILLARY
Glucose-Capillary: 108 mg/dL — ABNORMAL HIGH (ref 70–99)
Glucose-Capillary: 113 mg/dL — ABNORMAL HIGH (ref 70–99)
Glucose-Capillary: 135 mg/dL — ABNORMAL HIGH (ref 70–99)
Glucose-Capillary: 106 mg/dL — ABNORMAL HIGH (ref 70–99)

## 2019-08-06 LAB — PHOSPHORUS: Phosphorus: 3.3 mg/dL (ref 2.5–4.6)

## 2019-08-06 LAB — MAGNESIUM: Magnesium: 2 mg/dL (ref 1.7–2.4)

## 2019-08-06 MED ORDER — FUROSEMIDE 10 MG/ML IJ SOLN
40.0000 mg | Freq: Every day | INTRAMUSCULAR | Status: DC
  Administered 2019-08-06: 40 mg via INTRAVENOUS
  Filled 2019-08-06: qty 4

## 2019-08-06 MED ORDER — COLLAGENASE 250 UNIT/GM EX OINT
TOPICAL_OINTMENT | Freq: Every day | CUTANEOUS | Status: DC
  Administered 2019-08-06: 1 via TOPICAL
  Filled 2019-08-06: qty 30

## 2019-08-06 MED ORDER — SODIUM CHLORIDE 0.9% FLUSH: 10.0000 mL | INTRAVENOUS | Status: DC | PRN

## 2019-08-06 NOTE — Progress Notes (Signed)
     [name] is a 54 y.o. female   Orthopaedic diagnosis: Left foot cellulitis with diabetic pressure ulceration of the first MTP and plantar foot abscess status post I&D on 08/02/2019  Subjective: Patient has no complaints today.  Denies pain due to neuropathy.  Says no one has unwrapped her foot since the debridement in the ER.  Denies fevers or chills.   Objectyive: Vitals:   08/06/19 0646 08/06/19 0807  BP: (!) 158/70 (!) 152/74  Pulse: 80 82  Resp: 20   Temp: 99 F (37.2 C)   SpO2: 94%      Exam: Awake and alert Respirations even and unlabored No acute distress  Left foot was unwrapped.  The ulceration site appears to be doing well with some fibrinous base but no foul odor or exudate.  The skin edges appear viable.  The plantar aspect where she had blistering and ulceration appear to be healing well.  No sign of gross infection.  There is some dry skin with fluid underneath which was removed today.  Swelling and erythema has improved about the foot.  Baseline sensory deficits.  Assessment: Status post debridement of left foot diabetic ulceration and I&D of plantar foot abscess on 729   Plan: Her foot seems to be responding well to the antibiotics and the debridement.  I removed some dry skin on the plantar aspect of her foot today and there is no evidence of underlying necrotic skin or further abscess.  I stressed the importance to the patient of staying off her foot due to these wounds and the possibility of them progressing and resulting in gangrene of her foot.  Currently there are no signs of this.  From an orthopedic standpoint she is suitable to discharge from the hospital with close outpatient follow-up.  I would recommend wound care consult while in the hopsital and upon discharge daily dressing changes for her foot and to assist the patient on monitoring the wounds for improvement or progression.  Patient has poor eyesight. She will follow-up with me in 1 to 2  weeks time after discharge for wound check.  I tried to help the patient understand that if she has progression of her wound or develops a acute osteomyelitis that she would likely require formal operative debridement versus amputation.   Nicki Guadalajara, MD

## 2019-08-06 NOTE — TOC Initial Note (Signed)
Transition of Care [facility]) - Initial/Assessment Note    Patient Details  Name: [name] MRN: [id number] Date of Birth: 03-26-1965  Transition of Care [facility]) CM/SW Contact:    Golda Acre, RN Phone Number: 08/06/2019, 8:14 AM  Clinical Narrative:                 Pt with hx of itp and diabetes,rt bka amputation, presents with blisters to the left foot and cellulitis. Iv rocephin, iv flagyl,wcb 11.1, bun 34 and creat is 1.12. Plan-follow for home needs vs snf placement per the md notes.  Will evaluate what pt states about snf placement.  Expected Discharge Plan: Home/Self Care Barriers to Discharge: Continued Medical Work up   Patient Goals and CMS Choice Patient states their goals for this hospitalization and ongoing recovery are:: to go home CMS Medicare.gov Compare Post Acute Care list provided to:: Patient    Expected Discharge Plan and Services Expected Discharge Plan: Home/Self Care   Discharge Planning Services: CM Consult   Living arrangements for the past 2 months: Post-Acute Facility                                      Prior Living Arrangements/Services Living arrangements for the past 2 months: Post-Acute Facility Lives with:: Self Patient language and need for interpreter reviewed:: Yes Do you feel safe going back to the place where you live?: Yes      Need for Family Participation in Patient Care: Yes (Comment) Care giver support system in place?: Yes (comment)   Criminal Activity/Legal Involvement Pertinent to Current Situation/Hospitalization: No - Comment as needed  Activities of Daily Living Home Assistive Devices/Equipment: Cane (specify quad or straight), Walker (specify type), Wheelchair, CBG Meter ADL Screening (condition at time of admission) Patient's cognitive ability adequate to safely complete daily activities?: Yes Is the patient deaf or have difficulty hearing?: No Does the patient have difficulty seeing, even when wearing  glasses/contacts?: Yes (has cataracts) Does the patient have difficulty concentrating, remembering, or making decisions?: No Patient able to express need for assistance with ADLs?: Yes Does the patient have difficulty dressing or bathing?: No Independently performs ADLs?: Yes (appropriate for developmental age) Does the patient have difficulty walking or climbing stairs?: Yes (secondary to weakness) Weakness of Legs: Both Weakness of Arms/Hands: None  Permission Sought/Granted                  Emotional Assessment Appearance:: Appears stated age Attitude/Demeanor/Rapport: Engaged Affect (typically observed): Calm Orientation: : Oriented to Self, Oriented to Place, Oriented to  Time, Oriented to Situation Alcohol / Substance Use: Not Applicable Psych Involvement: No (comment)  Admission diagnosis:  Cellulitis of foot [L03.119] Cellulitis of left lower extremity [L03.116] Left foot infection [L08.9] Patient Active Problem List   Diagnosis Date Noted  . Cellulitis of foot 08/02/2019  . Nausea and vomiting 06/26/2019  . Hematuria 06/26/2019  . Hypertensive urgency 06/26/2019  . Acute diastolic CHF (congestive heart failure) (HCC)   . CHF (congestive heart failure) (HCC) 04/11/2019  . Acute exacerbation of CHF (congestive heart failure) (HCC) 04/11/2019  . Leukopenia 04/11/2019  . History of ITP 04/11/2019  . Idiopathic thrombocytopenic purpura (ITP) (HCC) 11/16/2018  . Onychomycosis of toenail 10/16/2018  . Cataract of both eyes 10/16/2018  . Tinea pedis of left foot 10/16/2018  . Decreased vision in both eyes 10/16/2018  . CKD (chronic kidney disease) stage  3, GFR 30-59 ml/min   . Cellulitis of left lower extremity 01/25/2018  . Hyperkalemia 01/25/2018  . Poor compliance 03/08/2017  . Hypoxia 03/07/2017  . Thyroid nodule 03/07/2017  . Chronic indwelling Foley catheter 03/04/2017  . Type II diabetes mellitus (HCC)   . Femur fracture (HCC) 03/02/2017  . Orthostatic  dizziness   . Urinary tract infection without hematuria   . Enterococcus faecalis infection   . Near syncope 11/25/2015  . Dehydration 11/25/2015  . Orthostatic hypotension 11/25/2015  . UTI (urinary tract infection) 11/25/2015  . Acute worsening of stage 3 chronic kidney disease 09/24/2015  . Diabetes mellitus due to underlying condition with chronic kidney disease, without long-term current use of insulin (HCC)   . Fever   . Status post below knee amputation of right lower extremity (HCC)   . Diabetic peripheral neuropathy (HCC)   . Neuropathic pain   . Benign essential HTN   . Folliculitis   . Slow transit constipation   . Anemia of chronic disease   . Bilateral hydronephrosis   . Urinary retention   . CKD (chronic kidney disease)   . Uncontrolled type 2 diabetes mellitus with diabetic nephropathy, without long-term current use of insulin (HCC)   . Vasculopathy   . Debility 09/04/2015  . DM type 2 with diabetic peripheral neuropathy (HCC)   . S/P BKA (below knee amputation) unilateral (HCC)   . Medical non-compliance   . Benign skin lesion of multiple sites   . Tachypnea   . Acute blood loss anemia   . Neurogenic bladder   . Occult blood positive stool   . Rectovaginal fistula   . Controlled diabetes mellitus type 2 with complications (HCC)   . Acute on chronic renal failure (HCC)   . Hydronephrosis determined by ultrasound   . Papular rash   . Uncontrolled type 2 diabetes mellitus with complication (HCC)   . Paresthesia   . Thrombocytopenia (HCC) 08/23/2015  . Pressure ulcer 08/23/2015  . Severe anemia 08/23/2015  . Lower urinary tract infectious disease 08/23/2015  . Absolute anemia 08/23/2015  . Peripheral vascular disease, unspecified (HCC) 04/27/2012  . Unilateral complete BKA (HCC) 07/09/2011  . Gas gangrene (HCC) 07/02/2011  . Sepsis(995.91) 07/02/2011  . Acute kidney injury superimposed on chronic kidney disease (HCC) 07/02/2011  . Diabetes mellitus (HCC)  11/22/2010  . Diabetic foot ulcer associated with type 2 diabetes mellitus (HCC)   . Essential hypertension   . Arthritis   . Neuropathy (HCC)   . Intracerebral hemorrhage (HCC)    PCP:  Barbette Merino, NP Pharmacy:   [facility] - [place], [place] - [address] [place] [address] Phone: [phone] Fax: [phone]  [facility] [address] ([place]), [place] - [address] [place] ([place]) [place] [address] Phone: [phone] Fax: [phone]     Social Determinants of Health (SDOH) Interventions    Readmission Risk Interventions Readmission Risk Prevention Plan 04/13/2019 11/13/2018  Transportation Screening Complete Complete  PCP or Specialist Appt within 5-7 Days - Complete  PCP or Specialist Appt within 3-5 Days Complete -  Home Care Screening - Complete  Medication Review (RN CM) - Complete  HRI or Home Care Consult Complete -  Social Work Consult for Recovery Care Planning/Counseling Complete -  Palliative Care Screening Not Applicable -  Medication Review (RN Care Manager) Complete -  Some recent data might be hidden

## 2019-08-06 NOTE — Progress Notes (Signed)
PROGRESS NOTE    [name]  GMW:[id number] DOB: December 25, 1965 DOA: 08/02/2019 PCP: Barbette Merino, NP   Brief Narrative:  [name] is a 54 y.o. 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 Patient was seen for blister on the left foot at [facility] ED yesterday after evaluation done and discharged on Keflex as the providers were concerned about chances of getting infection.  Her symptoms started 2 days ago may have started by hitting wheel chair but no obvious trauma. She is WC bound, lives by herself and home care comes to check on her vitals. Patient was seen in the oncology clinic today and sent to the ED for evaluation due to concern for worsening infection.   XRAY Left Foot" Marked severity diffuse soft tissue swelling and superficial plantar ulceration without evidence of an acute osseous abnormality.   Admitted for left lower extremity cellulitis and started on vancomycin and Zosyn.  Orthopedics was consulted.  She underwent minor debridement in the ED. Cellulitis is improving responding to IV antibiotics and debridement.  Assessment & Plan:   Principal Problem:   Cellulitis of foot Active Problems:   Essential hypertension   Diabetes mellitus (HCC)   Acute kidney injury superimposed on chronic kidney disease (HCC)   Peripheral vascular disease, unspecified (HCC)   S/P BKA (below knee amputation) unilateral (HCC)   Neurogenic bladder   Diabetic peripheral neuropathy (HCC)   Status post below knee amputation of right lower extremity (HCC)   Idiopathic thrombocytopenic purpura (ITP) (HCC)   CHF (congestive heart failure) (HCC)   - Cellulitis of Left foot / Suspected sepsis: POA with  leukocytosis, fever and heart rate more than 90 but hemodynamically stable with normal lactic acid.   X-ray with diffuse soft tissue swelling and superficial plantar ulceration,  no osseous abnormality.   Initiated on broad-spectrum antibiotics: VANCOMYCIN  & ZOSYN,  monitor clinical course  ,  changed to ceftriaxone and flagyl. monitor neurovascular in legs q shits, orthopedic ( Dr Susa Simmonds) was consulted by EDP, for close follow-up in case.   Blood cultures : No growth so far.  Dr. Susa Simmonds had seen the patient in ED- did bedside debridement irrigation with normal saline.  Xeroform dressing was placed and packed with gauze. -Cellulitis seems to be responding well to antibiotics and debridement. Ortho recommended wound care consult while in hospital and follow-up. -Discussed with pharmacy, Patient can be discharged on Levaquin and Flagyl for 5 more days to complete total 10-day course.  AKI on CKD stage IIIb: >>> Improving.    Baseline creatinine recently 1.8 , 1.1 today.  Suspecting due to #1 in the setting of medication use at home including Lasix.  Resume Lasix as Renal functions improved.  Essential hypertension : Continue  amlodipine, Lasix, Imdur, metoprolol.   Blood pressure in 130s to 140s, Continue metoprolol and Imdur, Lasix and amlodipine.  Diabetes mellitus with neuropathy. , last hba1c stable  5.5 on 04/12/19. Hold home Metformin due to AKI and cellulitis.  Add sliding scale insulin.   Peripheral vascular disease, unspecified /S/P Rt BKA hx-continue home aspirin.  Hx of Idiopathic thrombocytopenic purpura, on IVIG treatment. Plt on 7/14 were in 22k. Stable now.  Monitor platelet count closely.  CHF chronic, diastolic: Resumed  Lasix .  Hyponatremia:  Resolved.  history of neurogenic bladder.  Body mass index is 32.89 kg/m.    DVT prophylaxis: Lovenox Code Status:  Full Family Communication:  Discussed with patient, No one at bedside. Disposition Plan: Anticipated  discharge to Home / Self care tomorrow. Patient is not medically clear due to ongoing work-up / infection/duration of antibiotics. Discussed with pharmacy Patient can be discharged on Flagyl and Levaquin for 5 more days to complete total 10-day treatment.   Consultants:   orthopaedics.  Procedures:    Antimicrobials:  Anti-infectives (From admission, onward)   Start     Dose/Rate Route Frequency Ordered Stop   08/06/19 1000  vancomycin (VANCOREADY) IVPB 1500 mg/300 mL  Status:  Discontinued        1,500 mg 150 mL/hr over 120 Minutes Intravenous Every 24 hours 08/05/19 1405 08/05/19 1544   08/05/19 1800  cefTRIAXone (ROCEPHIN) 1 g in sodium chloride 0.9 % 100 mL IVPB     Discontinue     1 g 200 mL/hr over 30 Minutes Intravenous Every 24 hours 08/05/19 1544     08/03/19 1200  vancomycin (VANCOCIN) IVPB 1000 mg/200 mL premix  Status:  Discontinued        1,000 mg 200 mL/hr over 60 Minutes Intravenous Every 24 hours 08/02/19 1124 08/05/19 1405   08/03/19 1200  metroNIDAZOLE (FLAGYL) IVPB 500 mg     Discontinue     500 mg 100 mL/hr over 60 Minutes Intravenous Every 8 hours 08/03/19 1100     08/03/19 1200  ceFEPIme (MAXIPIME) 2 g in sodium chloride 0.9 % 100 mL IVPB  Status:  Discontinued        2 g 200 mL/hr over 30 Minutes Intravenous Every 12 hours 08/03/19 1100 08/05/19 1544   08/02/19 2000  piperacillin-tazobactam (ZOSYN) IVPB 3.375 g  Status:  Discontinued        3.375 g 12.5 mL/hr over 240 Minutes Intravenous Every 8 hours 08/02/19 1426 08/03/19 1100   08/02/19 1130  vancomycin (VANCOCIN) IVPB 1000 mg/200 mL premix  Status:  Discontinued        1,000 mg 200 mL/hr over 60 Minutes Intravenous  Once 08/02/19 1115 08/02/19 1118   08/02/19 1130  piperacillin-tazobactam (ZOSYN) IVPB 3.375 g        3.375 g 100 mL/hr over 30 Minutes Intravenous  Once 08/02/19 1115 08/02/19 1842   08/02/19 1130  vancomycin (VANCOREADY) IVPB 2000 mg/400 mL        2,000 mg 200 mL/hr over 120 Minutes  Intravenous  Once 08/02/19 1118 08/02/19 1842     Subjective: Patient was seen and examined at bedside.  No overnight events.  She reports feeling better,  still complains of having pain.  Left leg redness , warmth and pain has improved. She reports pain is better controlled.  Objective: Vitals:   08/05/19 2212 08/06/19 0646 08/06/19 0807 08/06/19 1342  BP: (!) 152/63 (!) 158/70 (!) 152/74 (!) 153/69  Pulse: 79 80 82 75  Resp: Temp: 98.2 F (36.8 C) 99 F (37.2 C)  98.5 F (36.9 C)  TempSrc:  Oral  Oral  SpO2: 95% 94%  97%  Weight:      Height:        Intake/Output Summary (Last 24 hours) at 08/06/2019 1500 Last data filed at 08/06/2019 1330 Gross per 24 hour  Intake 2964.59 ml  Output 2800 ml  Net 164.59 ml   Filed Weights   08/02/19 1045  Weight: (!) 95.3 kg    Examination:  General exam: Appears calm and comfortable  Respiratory system: Clear to auscultation. Respiratory effort normal. Cardiovascular system: S1 & S2 heard, RRR. No JVD, murmurs, rubs, gallops or clicks. No pedal edema. Gastrointestinal system: Abdomen is nondistended, soft and nontender. No organomegaly or masses felt. Normal bowel sounds heard. Central nervous system: Alert and oriented. No focal neurological deficits. Extremities: Right BKA, left leg erythematous,  Warm,  Swollen, tender. Skin: No rashes, lesions or ulcers Psychiatry: Judgement and insight appear normal. Mood & affect appropriate.     Data Reviewed: I have personally reviewed following labs and imaging studies  CBC: Recent Labs  Lab 07/30/19 1716 07/30/19 1716 07/31/19 2038 08/02/19 1131 08/03/19 0434 08/04/19 0500 08/06/19 0504  WBC 12.7*   < > 12.3* 18.2* 17.6* 17.8* 11.1*  NEUTROABS 10.0*  --  10.1* 15.2*  --   --   --   HGB 10.1*   < > 10.2* 10.3* 8.8* 9.0* 8.6*  HCT 33.3*   < > 32.5* 32.8* 28.6* 28.7* 27.8*  MCV 91.2   < > 88.6 89.1 90.8 89.4 89.4  PLT 380   < > 493* 513* 408* 402* 377   < > = values  in this interval not displayed.   Basic Metabolic Panel: Recent Labs  Lab 08/02/19 1131 08/03/19 0434 08/04/19 0500 08/05/19 0507 08/06/19 0504  NA 132* 133* 137 138 139  K 4.3 3.8 3.7 4.3 3.8  CL 99 105 109 111 110  CO2 22 19* 17* 16* 19*  GLUCOSE 149* 118* 134* 137* 124*  BUN 72* 68* 69* 51* 34*  CREATININE 2.36* 2.20* 2.00* 1.53* 1.12*  CALCIUM 8.5* 7.9* 8.0* 8.1* 8.2*  MG  --   --  1.8 1.6* 2.0  PHOS  --   --  4.3  --  3.3   GFR: Estimated Creatinine Clearance: 69.7 mL/min (A) (by C-G formula based on SCr of 1.12 mg/dL (H)). Liver Function Tests: Recent Labs  Lab 07/30/19 1716 07/31/19 2038 08/02/19 1131 08/03/19 0434 08/06/19 0504  AST 31 23 16  14* 13*  ALT 13 12 9 7 8   ALKPHOS 59 62 62 51 53  BILITOT 1.2 0.6 0.3 0.6 0.4  PROT 8.8* 8.3* 8.1 6.8 7.0  ALBUMIN 2.9* 2.5* 2.7* 2.3* 2.2*   No results for input(s): LIPASE, AMYLASE in the last 168 hours. No results for input(s): AMMONIA in the last 168 hours. Coagulation Profile: Recent Labs  Lab 07/30/19 1716  INR 1.3*   Cardiac Enzymes: No results for input(s): CKTOTAL, CKMB, CKMBINDEX, TROPONINI in the last 168 hours. BNP (last 3 results) No results for input(s): PROBNP in the last 8760 hours. HbA1C: No results for input(s): HGBA1C in the last 72 hours. CBG: Recent Labs  Lab 08/05/19 1213 08/05/19 1655 08/05/19 2210 08/06/19 0716 08/06/19 1339  GLUCAP 146* 116* 112* 108* 113*   Lipid Profile: No results for input(s): CHOL, HDL, LDLCALC, TRIG, CHOLHDL, LDLDIRECT in the last 72 hours. Thyroid Function Tests: No results for input(s): TSH, T4TOTAL, FREET4, T3FREE, THYROIDAB in the last 72 hours. Anemia Panel: No results for input(s): VITAMINB12, FOLATE, FERRITIN, TIBC, IRON, RETICCTPCT in the last 72 hours. Sepsis Labs: Recent Labs  Lab 07/30/19 1717 08/02/19 1131  LATICACIDVEN 1.7 1.3    Recent Results (from the past 240 hour(s))  Culture, blood (routine x 2)     Status: None   Collection  Time: 07/30/19  5:17 PM   Specimen: BLOOD RIGHT HAND  Result  Value Ref Range Status   Specimen Description   Final    BLOOD RIGHT HAND Performed at [facility], [address], [place], [place] [address]    Special Requests   Final    BOTTLES DRAWN AEROBIC AND ANAEROBIC Blood Culture results may not be optimal due to an inadequate volume of blood received in culture bottles Performed at [facility], [address] [place], [place] [address]    Culture   Final    NO GROWTH 5 DAYS Performed at [facility], [address], [place], [place] [address]    Report Status 08/04/2019 FINAL  Final  Culture, blood (routine x 2)     Status: None   Collection Time: 07/30/19  5:22 PM   Specimen: BLOOD  Result Value Ref Range Status   Specimen Description   Final    BLOOD LEFT ANTECUBITAL Performed at [facility], [address], [place], [place] [address]    Special Requests   Final    BOTTLES DRAWN AEROBIC AND ANAEROBIC Blood Culture adequate volume Performed at [facility], [address], [place], [place] [address]    Culture   Final    NO GROWTH 5 DAYS Performed at [facility], [address], [place], [place] [address]    Report Status 08/05/2019 FINAL  Final  Respiratory Panel by RT PCR (Flu A&B, Covid) - Nasopharyngeal Swab     Status: None   Collection Time: 07/30/19  6:08 PM   Specimen: Nasopharyngeal Swab  Result Value Ref Range Status   SARS Coronavirus 2 by RT PCR NEGATIVE NEGATIVE Final    Comment: (NOTE) SARS-CoV-2 target nucleic acids are NOT DETECTED.  The SARS-CoV-2 RNA is generally detectable in upper respiratoy specimens during the acute phase of infection. The lowest concentration of SARS-CoV-2 viral copies this assay can detect is 131 copies/mL. A negative result does not preclude SARS-Cov-2 infection and should not be used as the sole basis for treatment or other patient management  decisions. A negative result may occur with  improper specimen collection/handling, submission of specimen other than nasopharyngeal swab, presence of viral mutation(s) within the areas targeted by this assay, and inadequate number of viral copies (<131 copies/mL). A negative result must be combined with clinical observations, patient history, and epidemiological information. The expected result is Negative.  Fact Sheet for Patients:  https://www.moore.com/  Fact Sheet for Healthcare Providers:  https://www.young.biz/  This test is no t yet approved or cleared by the [place] FDA and  has been authorized for detection and/or diagnosis of SARS-CoV-2 by FDA under an Emergency Use Authorization (EUA). This EUA will remain  in effect (meaning this test can be used) for the duration of the COVID-19 declaration under Section 564(b)(1) of the Act, 21 U.S.C. section 360bbb-3(b)(1), unless the authorization is terminated or revoked sooner.     Influenza A by PCR NEGATIVE NEGATIVE Final   Influenza B by PCR NEGATIVE NEGATIVE Final    Comment: (NOTE) The Xpert Xpress SARS-CoV-2/FLU/RSV assay is intended as an aid in  the diagnosis of influenza from Nasopharyngeal swab specimens and  should not be used as a sole basis for treatment. Nasal washings and  aspirates are unacceptable for Xpert Xpress SARS-CoV-2/FLU/RSV  testing.  Fact Sheet for Patients: https://www.moore.com/  Fact Sheet for Healthcare Providers: https://www.young.biz/  This test is not yet approved or cleared by the [place] FDA and  has been authorized for detection  and/or diagnosis of SARS-CoV-2 by  FDA under an Emergency Use Authorization (EUA). This EUA will remain  in effect (meaning this test can be used) for the duration of the  Covid-19 declaration under Section 564(b)(1) of the Act, 21  U.S.C. section 360bbb-3(b)(1), unless the  authorization is  terminated or revoked. Performed at [facility], [address], [place], [place] [address]   Urine culture     Status: None   Collection Time: 07/30/19 10:32 PM   Specimen: Urine, Clean Catch  Result Value Ref Range Status   Specimen Description   Final    URINE, CLEAN CATCH Performed at [facility], [address], [place], [place] [address]    Special Requests   Final    NONE Performed at [facility], [address], [place], [place] [address]    Culture   Final    NO GROWTH Performed at [facility], [address], [place], [place] [address]    Report Status 08/01/2019 FINAL  Final  SARS Coronavirus 2 by RT PCR (hospital order, performed in [facility] hospital lab) Nasopharyngeal Nasopharyngeal Swab     Status: None   Collection Time: 08/02/19 11:09 AM   Specimen: Nasopharyngeal Swab  Result Value Ref Range Status   SARS Coronavirus 2 NEGATIVE NEGATIVE Final    Comment: (NOTE) SARS-CoV-2 target nucleic acids are NOT DETECTED.  The SARS-CoV-2 RNA is generally detectable in upper and lower respiratory specimens during the acute phase of infection. The lowest concentration of SARS-CoV-2 viral copies this assay can detect is 250 copies / mL. A negative result does not preclude SARS-CoV-2 infection and should not be used as the sole basis for treatment or other patient management decisions.  A negative result may occur with improper specimen collection / handling, submission of specimen other than nasopharyngeal swab, presence of viral mutation(s) within the areas targeted by this assay, and inadequate number of viral copies (<250 copies / mL). A negative result must be combined with clinical observations, patient history, and epidemiological information.  Fact Sheet for Patients:   BoilerBrush.com.cy  Fact Sheet for Healthcare  Providers: https://pope.com/  This test is not yet approved or  cleared by the [place] FDA and has been authorized for detection and/or diagnosis of SARS-CoV-2 by FDA under an Emergency Use Authorization (EUA).  This EUA will remain in effect (meaning this test can be used) for the duration of the COVID-19 declaration under Section 564(b)(1) of the Act, 21 U.S.C. section 360bbb-3(b)(1), unless the authorization is terminated or revoked sooner.  Performed at [facility], [address] [place], [place] [address]   Blood culture (routine x 2)     Status: None (Preliminary result)   Collection Time: 08/02/19 11:35 AM   Specimen: BLOOD  Result Value Ref Range Status   Specimen Description   Final    BLOOD OUTSIDE SERVICE OPLAB Performed at [facility], [address] [place], [place] [address]    Special Requests   Final    BOTTLES DRAWN AEROBIC AND ANAEROBIC Blood Culture adequate volume Performed at [facility], [address] [place], [place] [address]    Culture   Final    NO GROWTH 4 DAYS Performed at [facility], [address], [place], [place] [address]    Report Status PENDING  Incomplete  Blood culture (routine x 2)     Status: None (Preliminary result)  Collection Time: 08/02/19 11:35 AM   Specimen: BLOOD  Result Value Ref Range Status   Specimen Description   Final    BLOOD RIGHT ANTECUBITAL Performed at [facility], [address], [place], [place] [address]    Special Requests   Final    BOTTLES DRAWN AEROBIC ONLY Blood Culture adequate volume Performed at [facility], [address] [place], [place] [address]    Culture   Final    NO GROWTH 4 DAYS Performed at [facility], [address], [place], [place] [address]    Report Status PENDING  Incomplete  C Difficile Quick Screen w PCR reflex     Status: None    Collection Time: 08/04/19  1:27 PM   Specimen: STOOL  Result Value Ref Range Status   C Diff antigen NEGATIVE NEGATIVE Final   C Diff toxin NEGATIVE NEGATIVE Final   C Diff interpretation No C. difficile detected.  Final    Comment: Performed at [facility], [address] [place], [place] [address]     Radiology Studies: No results found.  Scheduled Meds: . enoxaparin (LOVENOX) injection  40 mg Subcutaneous Q24H  . gabapentin  400 mg Oral BID  . insulin aspart  0-9 Units Subcutaneous TID WC  . isosorbide mononitrate  30 mg Oral Daily  . metoprolol succinate  50 mg Oral Daily  . rosuvastatin  20 mg Oral Daily  . sodium bicarbonate  650 mg Oral TID   Continuous Infusions: . sodium chloride 100 mL/hr at 08/06/19 0600  . cefTRIAXone (ROCEPHIN)  IV Stopped (08/05/19 2142)  . metronidazole 500 mg (08/06/19 1307)     LOS: 4 days    Time spent: 25 mins.   Cipriano Bunker, MD [facility]   If 7PM-7AM, please contact night-coverage

## 2019-08-06 NOTE — Consult Note (Addendum)
WOC Nurse Consult Note: Reason for Consult: Consult requested for left foot wounds.  Pt has been followed by ortho service and debridement was performed in the ED.  WOC team requested to provide topical treatment recommendations. Wound type: Left foot with 3 areas of full thickness wounds:  Anterior plantar foot: 1X.5X.5cm, 100% yellow, small amt tan drainage, no odor Middle foot plantar extending to anterior; 5X10X.2cm, red and dry, no odor or drainage Middle plantar foot: .5X1X.3cm, 50% black, 50% yellow, small amt tan drainage, no odor   Dressing procedure/placement/frequency: Topical treatment orders provided for bedside and home health nurses to perform as follows to assist with enzymatic debridement and promote healing: Apply Santyl to left foot anterior and middle yellow and black wounds Q M/W/F, then cover with moist 2X2 gauze to each location.  Apply xeroform gauze to middle foot wound.  Cover left foot wounds with kerlex and ace wrap each time. Float heel to reduce pressure.   Pt states she has poor vision and is unable to assess the wound appearance and cannot drive to an outpatient wound clinic. She currently has home health assistance but states they only come once a week.  1. Recommend follow-up at the outpatient wound center since pt may need additional sharp debridement eventually after Santyl softens remaining slough/eschar in the wound beds, and to provide ongoing wound assessment. These arrangements can be made by the case manager if desired and must be by physician referral.  2. Pt may need social work assistance to provide her with available transportation assistance options to get to the clinic appointments.  3.  Recommend home health assistance 3 times a week to perform dressing changes using the topical treatment listed above, since pt states she is unable to perform this by herself.  Please re-consult if further assistance is needed.  Thank-you,  Cammie Mcgee MSN, RN, CWOCN,  [place], CNS [phone]

## 2019-08-06 NOTE — Care Management Important Message (Signed)
Important Message  Patient Details IM Letter given to the Patient Name: [name] MRN: [id number] Date of Birth: [birth year]   Medicare Important Message Given:  Yes     Caren Macadam 08/06/2019, 10:49 AM

## 2019-08-07 ENCOUNTER — Inpatient Hospital Stay: Payer: Medicare Other | Admitting: Hematology and Oncology

## 2019-08-07 ENCOUNTER — Inpatient Hospital Stay: Payer: Medicare Other

## 2019-08-07 LAB — BASIC METABOLIC PANEL
Anion gap: 10 (ref 5–15)
BUN: 22 mg/dL — ABNORMAL HIGH (ref 6–20)
CO2: 22 mmol/L (ref 22–32)
Calcium: 8.4 mg/dL — ABNORMAL LOW (ref 8.9–10.3)
Chloride: 111 mmol/L (ref 98–111)
Creatinine, Ser: 1.05 mg/dL — ABNORMAL HIGH (ref 0.44–1.00)
GFR calc Af Amer: >60 mL/min (ref >60)
GFR calc non Af Amer: >60 mL/min (ref >60)
Glucose, Bld: 103 mg/dL — ABNORMAL HIGH (ref 70–99)
Potassium: 3.6 mmol/L (ref 3.5–5.1)
Sodium: 143 mmol/L (ref 135–145)

## 2019-08-07 LAB — GLUCOSE, CAPILLARY
Glucose-Capillary: 90 mg/dL (ref 70–99)
Glucose-Capillary: 90 mg/dL (ref 70–99)
Glucose-Capillary: 129 mg/dL — ABNORMAL HIGH (ref 70–99)
Glucose-Capillary: 126 mg/dL — ABNORMAL HIGH (ref 70–99)
Glucose-Capillary: 124 mg/dL — ABNORMAL HIGH (ref 70–99)

## 2019-08-07 LAB — CULTURE, BLOOD (ROUTINE X 2)
Culture: NO GROWTH
Culture: NO GROWTH
Special Requests: ADEQUATE
Special Requests: ADEQUATE

## 2019-08-07 LAB — CBC
HCT: 28.1 % — ABNORMAL LOW (ref 36.0–46.0)
Hemoglobin: 8.8 g/dL — ABNORMAL LOW (ref 12.0–15.0)
MCH: 27.8 pg (ref 26.0–34.0)
MCHC: 31.3 g/dL (ref 30.0–36.0)
MCV: 88.9 fL (ref 80.0–100.0)
Platelets: 332 10*3/uL (ref 150–400)
RBC: 3.16 MIL/uL — ABNORMAL LOW (ref 3.87–5.11)
RDW: 19.6 % — ABNORMAL HIGH (ref 11.5–15.5)
WBC: 11.5 10*3/uL — ABNORMAL HIGH (ref 4.0–10.5)
nRBC: 0 % (ref 0.0–0.2)

## 2019-08-07 NOTE — Progress Notes (Signed)
PROGRESS NOTE    ALIECE Wolf   EHU:[id number]  DOB: 1965-12-25  DOA: 08/02/2019     5  PCP: Barbette Merino, NP  CC: left foot pain  Hospital Course: [name] is a 54 y.o. 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 Patient was seen for blister on the left foot at [facility] ED yesterday after evaluation done and discharged on Keflex as the providers were concerned about chances of getting infection.  Her symptoms started 2 days ago may have started by hitting wheel chair but no obvious trauma. She is WC bound, lives by herself and home care comes to check on her vitals. Patient was seen in the oncology clinic today and sent to the ED for evaluation due to concern for worsening infection.   XRAY Left Foot" Marked severity diffuse soft tissue swelling and superficial plantar ulceration without evidence of an acute osseous abnormality.   Admitted for left lower extremity cellulitis and started on vancomycin and Zosyn.  Orthopedics was consulted.  She underwent minor debridement in the ED. Cellulitis is improving responding to IV antibiotics and debridement.   Interval History:  No events overnight. Resting in bed in no distress this am. She adamantly does not want to go to an SNF however states she has no help at home and lives alone. She is expecting/hoping to go to a wound care center for dressing changes on days that home health cannot come to the house.   Old records reviewed in assessment of this patient  ROS: Constitutional: negative for fatigue and fevers, Respiratory: negative for cough, Cardiovascular: negative for chest pain and Gastrointestinal: negative for abdominal pain  Assessment  & Plan: Cellulitis ofLeftfoot /Suspected sepsis: POAwith leukocytosis, fever and heart rate more than 90 but hemodynamically stable with normal lactic acid.  X-ray with diffuse soft tissue swelling and superficial plantar ulceration,  no osseousabnormality. Initiated on broad-spectrum antibiotics: VANCOMYCIN &ZOSYN, monitor clinical course  ,  changed to ceftriaxone and flagyl. monitor neurovascular in legs q shits,orthopedic( Dr Susa Simmonds) wasconsultedbyEDP,for close follow-up in case.   Blood cultures : No growth so far. Dr. Bobbye Charleston seen the patient in ED-did bedside debridement irrigation with normal saline.  Xeroform dressing was placed and packed with gauze. -Cellulitis seems to be responding well to antibiotics and debridement. Ortho recommended wound care consult while in hospital and follow-up. -Discussed with pharmacy, Patient can be discharged on Levaquin and Flagyl to complete total 10-day course.  AKI on CKD stage IIIb: >>> Improving.    Baseline creatinine recently 1.8; improved during hospitalization.Suspecting due to #1 in the setting of medication use at home including Lasix.  Resume Lasix as Renal functions improved.  Essential hypertension: Continue  amlodipine, Lasix, Imdur, metoprolol.  Blood pressure in 130s to 140s, Continue metoprolol and Imdur, Lasix and amlodipine.  Diabetes mellituswith neuropathy. , last hba1c stable 5.5 on 04/12/19.Hold home Metformin due to AKI and cellulitis. Add sliding scale insulin.  Peripheral vascular disease, unspecified/S/PRtBKAhx-continue home aspirin.  Hx ofIdiopathic thrombocytopenic purpura,onIVIG treatment. Plt on 7/14 were in 22k. Stable now.Monitor platelet count closely.  CHFchronic, diastolic:Resumed  Lasix .  Hyponatremia:  Resolved.  History of neurogenic bladder.  Body mass index is 32.89 kg/m.  Antimicrobials: Rocephin 08/05/19 >> present  Flagyl 08/03/19>> present  DVT  prophylaxis: Lovenox Code Status: Full Family Communication: none present Disposition Plan:  Status is: Inpatient  Remains inpatient appropriate because:IV treatments appropriate due to intensity of illness or inability to take PO and Inpatient level of care appropriate due to severity of illness   Dispo: The patient is from: Home              Anticipated d/c is to: Home              Anticipated d/c date is: 2 days              Patient currently is not medically stable to d/c.       Objective: Blood pressure (!) 160/73, pulse 79, temperature 98.8 F (37.1 C), temperature source Oral, resp. rate 19, height 5\' 7"  (1.702 m), weight (!) 95.3 kg, SpO2 97 %.  Examination: General appearance: alert, cooperative and no distress Head: Normocephalic, without obvious abnormality Eyes: EOMI Lungs: clear to auscultation bilaterally Heart: regular rate and rhythm and S1, S2 normal Abdomen: normal findings: bowel sounds normal and soft, non-tender Extremities: see pic below Skin: mobility and turgor normal Neurologic: Grossly normal    Consultants:   Ortho  Procedures:   n/a  Data Reviewed: I have personally reviewed following labs and imaging studies Results for orders placed or performed during the hospital encounter of 08/02/19 (from the past 24 hour(s))  Glucose, capillary     Status: Abnormal   Collection Time: 08/06/19  9:10 PM  Result Value Ref Range   Glucose-Capillary 106 (H) 70 - 99 mg/dL  CBC     Status: Abnormal   Collection Time: 08/07/19  3:58 AM  Result Value Ref Range   WBC 11.5 (H) 4.0 - 10.5 K/uL   RBC 3.16 (L) 3.87 - 5.11 MIL/uL   Hemoglobin 8.8 (L) 12.0 - 15.0 g/dL   HCT 16.128.1 (L) 36 - 46 %   MCV 88.9 80.0 - 100.0 fL   MCH 27.8 26.0 - 34.0 pg   MCHC 31.3 30.0 - 36.0 g/dL   RDW 09.619.6 (H) 04.511.5 - 40.915.5 %   Platelets 332 150 - 400 K/uL   nRBC 0.0 0.0 - 0.2 %  Basic metabolic panel     Status: Abnormal   Collection Time: 08/07/19  3:58 AM  Result Value Ref  Range   Sodium 143 135 - 145 mmol/L   Potassium 3.6 3.5 - 5.1 mmol/L   Chloride 111 98 - 111 mmol/L   CO2 22 22 - 32 mmol/L   Glucose, Bld 103 (H) 70 - 99 mg/dL   BUN 22 (H) 6 - 20 mg/dL   Creatinine, Ser 8.111.05 (H) 0.44 - 1.00 mg/dL   Calcium 8.4 (L) 8.9 - 10.3 mg/dL   GFR calc non Af Amer >60 >60 mL/min   GFR calc Af Amer >60 >60 mL/min   Anion gap 10 5 - 15  Glucose, capillary     Status: None   Collection Time: 08/07/19  8:42 AM  Result Value Ref Range   Glucose-Capillary 90 70 - 99 mg/dL  Glucose, capillary     Status: None   Collection Time: 08/07/19  8:42 AM  Result Value Ref Range   Glucose-Capillary 90 70 - 99 mg/dL  Glucose, capillary     Status: Abnormal   Collection Time: 08/07/19 11:39 AM  Result Value Ref Range   Glucose-Capillary 129 (H) 70 - 99 mg/dL  Glucose, capillary     Status: Abnormal  Collection Time: 08/07/19  5:15 PM  Result Value Ref Range   Glucose-Capillary 126 (H) 70 - 99 mg/dL    Recent Results (from the past 240 hour(s))  Culture, blood (routine x 2)     Status: None   Collection Time: 07/30/19  5:17 PM   Specimen: BLOOD RIGHT HAND  Result Value Ref Range Status   Specimen Description   Final    BLOOD RIGHT HAND Performed at [facility], [address] [place], [place] [address]    Special Requests   Final    BOTTLES DRAWN AEROBIC AND ANAEROBIC Blood Culture results may not be optimal due to an inadequate volume of blood received in culture bottles Performed at [facility], [address] [place], [place] [address]    Culture   Final    NO GROWTH 5 DAYS Performed at [facility], [address], [place], [place] [address]    Report Status 08/04/2019 FINAL  Final  Culture, blood (routine x 2)     Status: None   Collection Time: 07/30/19  5:22 PM   Specimen: BLOOD  Result Value Ref Range Status   Specimen Description   Final    BLOOD LEFT ANTECUBITAL Performed at [facility],  [address] [place], [place] [address]    Special Requests   Final    BOTTLES DRAWN AEROBIC AND ANAEROBIC Blood Culture adequate volume Performed at [facility], [address] [place], [place] [address]    Culture   Final    NO GROWTH 5 DAYS Performed at [facility], [address] [place], [place] [address]    Report Status 08/05/2019 FINAL  Final  Respiratory Panel by RT PCR (Flu A&B, Covid) - Nasopharyngeal Swab     Status: None   Collection Time: 07/30/19  6:08 PM   Specimen: Nasopharyngeal Swab  Result Value Ref Range Status   SARS Coronavirus 2 by RT PCR NEGATIVE NEGATIVE Final    Comment: (NOTE) SARS-CoV-2 target nucleic acids are NOT DETECTED.  The SARS-CoV-2 RNA is generally detectable in upper respiratoy specimens during the acute phase of infection. The lowest concentration of SARS-CoV-2 viral copies this assay can detect is 131 copies/mL. A negative result does not preclude SARS-Cov-2 infection and should not be used as the sole basis for treatment or other patient management decisions. A negative result may occur with  improper specimen collection/handling, submission of specimen other than nasopharyngeal swab, presence of viral mutation(s) within the areas targeted by this assay, and inadequate number of viral copies (<131 copies/mL). A negative result must be combined with clinical observations, patient history, and epidemiological information. The expected result is Negative.  Fact Sheet for Patients:  https://www.moore.com/  Fact Sheet for Healthcare Providers:  https://www.young.biz/  This test is no t yet approved or cleared by the [place] FDA and  has been authorized for detection and/or diagnosis of SARS-CoV-2 by FDA under an Emergency Use Authorization (EUA). This EUA will remain  in effect (meaning this test can be used) for the duration of the COVID-19 declaration under Section  564(b)(1) of the Act, 21 U.S.C. section 360bbb-3(b)(1), unless the authorization is terminated or revoked sooner.     Influenza A by PCR NEGATIVE NEGATIVE Final   Influenza B by PCR NEGATIVE NEGATIVE Final    Comment: (NOTE) The Xpert Xpress SARS-CoV-2/FLU/RSV assay is intended as an aid in  the diagnosis of influenza from Nasopharyngeal swab specimens  and  should not be used as a sole basis for treatment. Nasal washings and  aspirates are unacceptable for Xpert Xpress SARS-CoV-2/FLU/RSV  testing.  Fact Sheet for Patients: https://www.moore.com/  Fact Sheet for Healthcare Providers: https://www.young.biz/  This test is not yet approved or cleared by the [place] FDA and  has been authorized for detection and/or diagnosis of SARS-CoV-2 by  FDA under an Emergency Use Authorization (EUA). This EUA will remain  in effect (meaning this test can be used) for the duration of the  Covid-19 declaration under Section 564(b)(1) of the Act, 21  U.S.C. section 360bbb-3(b)(1), unless the authorization is  terminated or revoked. Performed at [facility], [address] [place], [place] [address]   Urine culture     Status: None   Collection Time: 07/30/19 10:32 PM   Specimen: Urine, Clean Catch  Result Value Ref Range Status   Specimen Description   Final    URINE, CLEAN CATCH Performed at [facility], [address], [place], [place] [address]    Special Requests   Final    NONE Performed at [facility], [address], [place], [place] [address]    Culture   Final    NO GROWTH Performed at [facility], [address] [place], [place] [address]    Report Status 08/01/2019 FINAL  Final  SARS Coronavirus 2 by RT PCR (hospital order, performed in [facility] hospital lab) Nasopharyngeal Nasopharyngeal Swab     Status: None   Collection Time: 08/02/19 11:09 AM   Specimen:  Nasopharyngeal Swab  Result Value Ref Range Status   SARS Coronavirus 2 NEGATIVE NEGATIVE Final    Comment: (NOTE) SARS-CoV-2 target nucleic acids are NOT DETECTED.  The SARS-CoV-2 RNA is generally detectable in upper and lower respiratory specimens during the acute phase of infection. The lowest concentration of SARS-CoV-2 viral copies this assay can detect is 250 copies / mL. A negative result does not preclude SARS-CoV-2 infection and should not be used as the sole basis for treatment or other patient management decisions.  A negative result may occur with improper specimen collection / handling, submission of specimen other than nasopharyngeal swab, presence of viral mutation(s) within the areas targeted by this assay, and inadequate number of viral copies (<250 copies / mL). A negative result must be combined with clinical observations, patient history, and epidemiological information.  Fact Sheet for Patients:   BoilerBrush.com.cy  Fact Sheet for Healthcare Providers: https://pope.com/  This test is not yet approved or  cleared by the [place] FDA and has been authorized for detection and/or diagnosis of SARS-CoV-2 by FDA under an Emergency Use Authorization (EUA).  This EUA will remain in effect (meaning this test can be used) for the duration of the COVID-19 declaration under Section 564(b)(1) of the Act, 21 U.S.C. section 360bbb-3(b)(1), unless the authorization is terminated or revoked sooner.  Performed at [facility], [address] [place], [place] [address]   Blood culture (routine x 2)     Status: None   Collection Time: 08/02/19 11:35 AM   Specimen: BLOOD  Result Value Ref Range Status   Specimen Description   Final    BLOOD OUTSIDE SERVICE OPLAB Performed at [facility], [address], [place], [place] [address]    Special Requests   Final    BOTTLES DRAWN AEROBIC  AND ANAEROBIC Blood Culture adequate volume Performed at [facility], [address] [place], [place] [address]    Culture   Final    NO GROWTH 5 DAYS Performed at [facility], [address], [place], [place] [address]    Report Status 08/07/2019 FINAL  Final  Blood culture (routine x 2)     Status: None   Collection Time: 08/02/19 11:35 AM   Specimen: BLOOD  Result Value Ref Range Status   Specimen Description   Final    BLOOD RIGHT ANTECUBITAL Performed at [facility], [address], [place], [place] [address]    Special Requests   Final    BOTTLES DRAWN AEROBIC ONLY Blood Culture adequate volume Performed at [facility], [address] [place], [place] [address]    Culture   Final    NO GROWTH 5 DAYS Performed at [facility], [address], [place], [place] [address]    Report Status 08/07/2019 FINAL  Final  C Difficile Quick Screen w PCR reflex     Status: None   Collection Time: 08/04/19  1:27 PM   Specimen: STOOL  Result Value Ref Range Status   C Diff antigen NEGATIVE NEGATIVE Final   C Diff toxin NEGATIVE NEGATIVE Final   C Diff interpretation No C. difficile detected.  Final    Comment: Performed at [facility], [address], [place], [place] [address]     Radiology Studies: No results found. DG Foot Complete Left  Final Result       Scheduled Meds: . collagenase   Topical Daily  . enoxaparin (LOVENOX) injection  40 mg Subcutaneous Q24H  . gabapentin  400 mg Oral BID  . insulin aspart  0-9 Units Subcutaneous TID WC  . isosorbide mononitrate  30 mg Oral Daily  . metoprolol succinate  50 mg Oral Daily  . rosuvastatin  20 mg Oral Daily   PRN Meds: acetaminophen **OR** acetaminophen, aspirin EC, HYDROcodone-acetaminophen, ondansetron **OR** ondansetron (ZOFRAN) IV, senna-docusate, sodium chloride flush Continuous Infusions: . cefTRIAXone (ROCEPHIN)  IV 1 g  (08/07/19 1725)  . metronidazole 500 mg (08/07/19 1137)      LOS: 5 days  Time spent: Greater than 50% of the 35 minute visit was spent in counseling/coordination of care for the patient as laid out in the A&P.   Lewie Chamber, MD [facility] 08/07/2019, 5:42 PM   Contact via secure chat.  To contact the attending provider between 7A-7P or the covering provider during after hours 7P-7A, please log into the web site [email or web address] and access using universal [place] password for that web site. If you do not have the password, please call the hospital operator.

## 2019-08-07 NOTE — TOC Initial Note (Deleted)
cTransition of Care [facility]) - Initial/Assessment Note    Patient Details  Name: [name] MRN: [id number] Date of Birth: 1965-01-30  Transition of [facility]) CM/SW Contact:    Darleene Cleaver, LCSW Phone Number: 08/07/2019, 5:36 PM  Clinical Narrative:                 Patient is a 54 year old female who is alert and oriented x4.  Patient lives alone, and has a Biomedical engineer from [facility] who checks on her weekly.  Patient is blind and lives alone, she will need Home Health RN to help with wound dressing changes.  CSW spoke to [facility] and they can accept her.  CSW to continue to follow patient throughout discharge planning.   Expected Discharge Plan: Home/Self Care Barriers to Discharge: Continued Medical Work up   Patient Goals and CMS Choice Patient states their goals for this hospitalization and ongoing recovery are:: To return back home with home health services. CMS Medicare.gov Compare Post Acute Care list provided to:: Patient Choice offered to / list presented to : Patient  Expected Discharge Plan and Services Expected Discharge Plan: Home/Self Care   Discharge Planning Services: CM Consult   Living arrangements for the past 2 months: Post-Acute Facility                           HH Arranged: RN, PT [facility] Agency: [facility] Date HH Agency Contacted: 08/07/19 Time HH Agency Contacted: 1700 Representative spoke with at [facility] Agency: Clydie Braun  Prior Living Arrangements/Services Living arrangements for the past 2 months: Post-Acute Facility Lives with:: Self Patient language and need for interpreter reviewed:: Yes Do you feel safe going back to the place where you live?: Yes      Need for Family Participation in Patient Care: Yes (Comment) Care giver support system in place?: Yes (comment)   Criminal Activity/Legal Involvement Pertinent to Current Situation/Hospitalization: No - Comment as needed  Activities of Daily Living Home Assistive  Devices/Equipment: Cane (specify quad or straight), Walker (specify type), Wheelchair, CBG Meter ADL Screening (condition at time of admission) Patient's cognitive ability adequate to safely complete daily activities?: Yes Is the patient deaf or have difficulty hearing?: No Does the patient have difficulty seeing, even when wearing glasses/contacts?: Yes (has cataracts) Does the patient have difficulty concentrating, remembering, or making decisions?: No Patient able to express need for assistance with ADLs?: Yes Does the patient have difficulty dressing or bathing?: No Independently performs ADLs?: Yes (appropriate for developmental age) Does the patient have difficulty walking or climbing stairs?: Yes (secondary to weakness) Weakness of Legs: Both Weakness of Arms/Hands: None  Permission Sought/Granted                  Emotional Assessment Appearance:: Appears stated age Attitude/Demeanor/Rapport: Engaged Affect (typically observed): Calm Orientation: : Oriented to Self, Oriented to Place, Oriented to  Time, Oriented to Situation Alcohol / Substance Use: Not Applicable Psych Involvement: No (comment)  Admission diagnosis:  Cellulitis of foot [L03.119] Cellulitis of left lower extremity [L03.116] Left foot infection [L08.9] Patient Active Problem List   Diagnosis Date Noted  . Cellulitis of foot 08/02/2019  . Nausea and vomiting 06/26/2019  . Hematuria 06/26/2019  . Hypertensive urgency 06/26/2019  . Acute diastolic CHF (congestive heart failure) (HCC)   . CHF (congestive heart failure) (HCC) 04/11/2019  . Acute exacerbation of CHF (congestive heart failure) (HCC) 04/11/2019  . Leukopenia 04/11/2019  .  History of ITP 04/11/2019  . Idiopathic thrombocytopenic purpura (ITP) (HCC) 11/16/2018  . Onychomycosis of toenail 10/16/2018  . Cataract of both eyes 10/16/2018  . Tinea pedis of left foot 10/16/2018  . Decreased vision in both eyes 10/16/2018  . CKD (chronic kidney  disease) stage 3, GFR 30-59 ml/min   . Cellulitis of left lower extremity 01/25/2018  . Hyperkalemia 01/25/2018  . Poor compliance 03/08/2017  . Hypoxia 03/07/2017  . Thyroid nodule 03/07/2017  . Chronic indwelling Foley catheter 03/04/2017  . Type II diabetes mellitus (HCC)   . Femur fracture (HCC) 03/02/2017  . Orthostatic dizziness   . Urinary tract infection without hematuria   . Enterococcus faecalis infection   . Near syncope 11/25/2015  . Dehydration 11/25/2015  . Orthostatic hypotension 11/25/2015  . UTI (urinary tract infection) 11/25/2015  . Acute worsening of stage 3 chronic kidney disease 09/24/2015  . Diabetes mellitus due to underlying condition with chronic kidney disease, without long-term current use of insulin (HCC)   . Fever   . Status post below knee amputation of right lower extremity (HCC)   . Diabetic peripheral neuropathy (HCC)   . Neuropathic pain   . Benign essential HTN   . Folliculitis   . Slow transit constipation   . Anemia of chronic disease   . Bilateral hydronephrosis   . Urinary retention   . CKD (chronic kidney disease)   . Uncontrolled type 2 diabetes mellitus with diabetic nephropathy, without long-term current use of insulin (HCC)   . Vasculopathy   . Debility 09/04/2015  . DM type 2 with diabetic peripheral neuropathy (HCC)   . S/P BKA (below knee amputation) unilateral (HCC)   . Medical non-compliance   . Benign skin lesion of multiple sites   . Tachypnea   . Acute blood loss anemia   . Neurogenic bladder   . Occult blood positive stool   . Rectovaginal fistula   . Controlled diabetes mellitus type 2 with complications (HCC)   . Acute on chronic renal failure (HCC)   . Hydronephrosis determined by ultrasound   . Papular rash   . Uncontrolled type 2 diabetes mellitus with complication (HCC)   . Paresthesia   . Thrombocytopenia (HCC) 08/23/2015  . Pressure ulcer 08/23/2015  . Severe anemia 08/23/2015  . Lower urinary tract  infectious disease 08/23/2015  . Absolute anemia 08/23/2015  . Peripheral vascular disease, unspecified (HCC) 04/27/2012  . Unilateral complete BKA (HCC) 07/09/2011  . Gas gangrene (HCC) 07/02/2011  . Sepsis(995.91) 07/02/2011  . Acute kidney injury superimposed on chronic kidney disease (HCC) 07/02/2011  . Diabetes mellitus (HCC) 11/22/2010  . Diabetic foot ulcer associated with type 2 diabetes mellitus (HCC)   . Essential hypertension   . Arthritis   . Neuropathy (HCC)   . Intracerebral hemorrhage (HCC)    PCP:  Barbette Merino, NP Pharmacy:   [facility] [place], [place] - [address] [place] [address] Phone: [phone] Fax: [phone]  [facility] [address] [place] ([place]), [place] - [address] [place]) [place] [address] Phone: [phone] Fax: [phone]     Social Determinants of Health (SDOH) Interventions    Readmission Risk Interventions Readmission Risk Prevention Plan 04/13/2019 11/13/2018  Transportation Screening Complete Complete  PCP or Specialist Appt within 5-7 Days - Complete  PCP or Specialist Appt within 3-5 Days Complete -  Home Care Screening - Complete  Medication Review (RN  CM) - Complete  HRI or Home Care Consult Complete -  Social Work Consult for Recovery Care Planning/Counseling Complete -  Palliative Care Screening Not Applicable -  Medication Review (RN Care Manager) Complete -  Some recent data might be hidden

## 2019-08-07 NOTE — Hospital Course (Addendum)
[name] is a 54 y.o. 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 Patient was seen for blister on the left foot at [facility] ED yesterday after evaluation done and discharged on Keflex as the providers were concerned about chances of getting infection.  Her symptoms started 2 days ago may have started by hitting wheel chair but no obvious trauma. She is WC bound, lives by herself and home care comes to check on her vitals. Patient was seen in the oncology clinic today and sent to the ED for evaluation due to concern for worsening infection.   XRAY Left Foot" Marked severity diffuse soft tissue swelling and superficial plantar ulceration without evidence of an acute osseous abnormality.   Admitted for left lower extremity cellulitis and started on vancomycin and Zosyn.  Orthopedics was consulted.  She underwent minor debridement in the ED. Cellulitis is improving responding to IV antibiotics and debridement. Antibiotics were deescalated and modified during hospitalization.  She was considered to have completed a sufficient course of appropriate antibiotics prior to discharge. She was arranged to have home health at time of discharge to continue assistance with ongoing wound care and dressing changes.

## 2019-08-07 NOTE — TOC Progression Note (Signed)
Transition of Care [facility]) - Progression Note    Patient Details  Name: NEELEY SEDIVY MRN: [id number] Date of Birth: [birth year]  Transition of Care [facility]) CM/SW Contact  Darleene Cleaver, [place] Phone Number: 08/07/2019, 5:41 PM  Clinical Narrative:     Patient is a 54 year old female who is alert and oriented x4.  Patient lives alone, and has a Biomedical engineer from [facility] who checks on her weekly.  Patient is blind and lives alone, she will need Home Health RN to help with wound dressing changes.  CSW spoke to [facility] and they can accept her.  CSW to continue to follow patient throughout discharge planning.  Expected Discharge Plan: Home/Self Care Barriers to Discharge: Continued Medical Work up  Expected Discharge Plan and Services Expected Discharge Plan: Home/Self Care   Discharge Planning Services: CM Consult   Living arrangements for the past 2 months: Post-Acute Facility                           HH Arranged: RN, PT [facility] Agency: [facility] ([facility] Date HH Agency Contacted: 08/07/19 Time HH Agency Contacted: 1700 Representative spoke with at [facility] Agency: Clydie Braun   Social Determinants of Health (SDOH) Interventions    Readmission Risk Interventions Readmission Risk Prevention Plan 04/13/2019 11/13/2018  Transportation Screening Complete Complete  PCP or Specialist Appt within 5-7 Days - Complete  PCP or Specialist Appt within 3-5 Days Complete -  Home Care Screening - Complete  Medication Review (RN CM) - Complete  HRI or Home Care Consult Complete -  Social Work Consult for Recovery Care Planning/Counseling Complete -  Palliative Care Screening Not Applicable -  Medication Review Oceanographer) Complete -  Some recent data might be hidden

## 2019-08-08 LAB — CBC WITH DIFFERENTIAL/PLATELET
Abs Immature Granulocytes: 0.14 10*3/uL — ABNORMAL HIGH (ref 0.00–0.07)
Basophils Absolute: 0 10*3/uL (ref 0.0–0.1)
Basophils Relative: 0 %
Eosinophils Absolute: 0.1 10*3/uL (ref 0.0–0.5)
Eosinophils Relative: 1 %
HCT: 27.5 % — ABNORMAL LOW (ref 36.0–46.0)
Hemoglobin: 8.6 g/dL — ABNORMAL LOW (ref 12.0–15.0)
Immature Granulocytes: 1 %
Lymphocytes Relative: 9 %
Lymphs Abs: 1.2 10*3/uL (ref 0.7–4.0)
MCH: 27.4 pg (ref 26.0–34.0)
MCHC: 31.3 g/dL (ref 30.0–36.0)
MCV: 87.6 fL (ref 80.0–100.0)
Monocytes Absolute: 1 10*3/uL (ref 0.1–1.0)
Monocytes Relative: 7 %
Neutro Abs: 10.7 10*3/uL — ABNORMAL HIGH (ref 1.7–7.7)
Neutrophils Relative %: 82 %
Platelets: 301 10*3/uL (ref 150–400)
RBC: 3.14 MIL/uL — ABNORMAL LOW (ref 3.87–5.11)
RDW: 19.4 % — ABNORMAL HIGH (ref 11.5–15.5)
WBC: 13.1 10*3/uL — ABNORMAL HIGH (ref 4.0–10.5)
nRBC: 0 % (ref 0.0–0.2)

## 2019-08-08 LAB — BASIC METABOLIC PANEL
Anion gap: 5 (ref 5–15)
BUN: 14 mg/dL (ref 6–20)
CO2: 24 mmol/L (ref 22–32)
Calcium: 8.3 mg/dL — ABNORMAL LOW (ref 8.9–10.3)
Chloride: 108 mmol/L (ref 98–111)
Creatinine, Ser: 0.88 mg/dL (ref 0.44–1.00)
GFR calc Af Amer: >60 mL/min (ref >60)
GFR calc non Af Amer: >60 mL/min (ref >60)
Glucose, Bld: 117 mg/dL — ABNORMAL HIGH (ref 70–99)
Potassium: 3.4 mmol/L — ABNORMAL LOW (ref 3.5–5.1)
Sodium: 137 mmol/L (ref 135–145)

## 2019-08-08 LAB — GLUCOSE, CAPILLARY
Glucose-Capillary: 105 mg/dL — ABNORMAL HIGH (ref 70–99)
Glucose-Capillary: 140 mg/dL — ABNORMAL HIGH (ref 70–99)

## 2019-08-08 LAB — MAGNESIUM: Magnesium: 1.4 mg/dL — ABNORMAL LOW (ref 1.7–2.4)

## 2019-08-08 MED ORDER — AMOXICILLIN-POT CLAVULANATE 875-125 MG PO TABS: 1.0000 | ORAL_TABLET | Freq: Two times a day (BID) | ORAL | 0 refills | Status: DC

## 2019-08-08 MED ORDER — AMOXICILLIN-POT CLAVULANATE 875-125 MG PO TABS
1.0000 | ORAL_TABLET | Freq: Two times a day (BID) | ORAL | Status: DC
  Administered 2019-08-08: 1 via ORAL
  Filled 2019-08-08: qty 1

## 2019-08-08 NOTE — TOC Transition Note (Signed)
Transition of Care [facility]) - CM/SW Discharge Note   Patient Details  Name: [name] MRN: [id number] Date of Birth: 09-26-1965  Transition [facility] Care [facility]) CM/SW Contact:  Darleene Cleaver, LCSW Phone Number: 08/08/2019, 12:26 PM   Clinical Narrative:    Patient will be going home with home health through [facility] for PT, RN and social work.  CSW signing off please reconsult with any other social work needs, home health agency has been notified of planned discharge.    Final next level of care: Home w Home Health Services Barriers to Discharge: Barriers Resolved   Patient Goals and CMS Choice Patient states their goals for this hospitalization and ongoing recovery are:: To return back home with home health services. CMS Medicare.gov Compare Post Acute Care list provided to:: Patient Choice offered to / list presented to : Patient  Discharge Placement                       Discharge Plan and Services In-house Referral: Clinical Social Work Discharge Planning Services: CM Consult Post Acute Care Choice: Home Health          DME Arranged: N/A DME Agency: NA       HH Arranged: RN, PT, Social Work [employer] Agency: [employer] ([facility]) Date HH Agency Contacted: 08/08/19 Time HH Agency Contacted: 1100 Representative spoke with at [facility] Agency: Clydie Braun  Social Determinants of Health (SDOH) Interventions     Readmission Risk Interventions Readmission Risk Prevention Plan 04/13/2019 11/13/2018  Transportation Screening Complete Complete  PCP or Specialist Appt within 5-7 Days - Complete  PCP or Specialist Appt within 3-5 Days Complete -  Home Care Screening - Complete  Medication Review (RN CM) - Complete  HRI or Home Care Consult Complete -  Social Work Consult for Recovery Care Planning/Counseling Complete -  Palliative Care Screening Not Applicable -  Medication Review Oceanographer) Complete -  Some recent data might be hidden

## 2019-08-08 NOTE — Plan of Care (Signed)
  Problem: Education: Goal: Knowledge of General Education information will improve Description Including pain rating scale, medication(s)/side effects and non-pharmacologic comfort measures Outcome: Progressing   

## 2019-08-08 NOTE — Progress Notes (Signed)
Patient discharged to home, instructions reviewed with pt and acknowledges understanding.

## 2019-08-08 NOTE — Progress Notes (Signed)
Spoke with RN Sophia and made her aware she can DC the midline

## 2019-08-09 ENCOUNTER — Other Ambulatory Visit: Payer: Self-pay | Admitting: Nurse Practitioner

## 2019-08-09 MED ORDER — DOXYCYCLINE HYCLATE 100 MG PO CAPS: 100.0000 mg | ORAL_CAPSULE | Freq: Two times a day (BID) | ORAL | 0 refills | Status: AC

## 2019-08-09 NOTE — Discharge Summary (Signed)
Physician Discharge Summary  [name] XIP:[id number] DOB: 1965-04-08 DOA: 08/02/2019  PCP: Vevelyn Francois, NP  Admit date: 08/02/2019 Discharge date: 08/09/2019  Admitted From: Home Disposition: Home Discharging physician: [name], MD  Recommendations for Outpatient Follow-up:  1. Continue care with home health 2. Follow-up with orthopedic surgery  Home Health: Arranged prior to discharge  Patient discharged to home in Discharge Condition: fair CODE STATUS: Full Diet recommendation:  Diet Orders (From admission, onward)    Start     Ordered   08/08/19 0000  Diet Carb Modified        08/08/19 1142          Hospital Course: [name] is a 54 y.o. 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 Patient was seen for blister on the left foot at [name] ED yesterday after evaluation done and discharged on Keflex as the providers were concerned about chances of getting infection.  Her symptoms started 2 days ago may have started by hitting wheel chair but no obvious trauma. She is WC bound, lives by herself and home care comes to check on her vitals. Patient was seen in the oncology clinic today and sent to the ED for evaluation due to concern for worsening infection.   XRAY Left Foot" Marked severity diffuse soft tissue swelling and superficial plantar ulceration without evidence of an acute osseous abnormality.   Admitted for left lower extremity cellulitis and started on vancomycin and Zosyn.  Orthopedics was consulted.  She underwent minor debridement in the ED. Cellulitis is improving responding to IV antibiotics and debridement. Antibiotics were deescalated and modified during  hospitalization.  She was considered to have completed a sufficient course of appropriate antibiotics prior to discharge. She was arranged to have home health at time of discharge to continue assistance with ongoing wound care and dressing changes.   The patient's chronic medical conditions were treated accordingly per the patient's home medication regimen except as noted.  On day of discharge, patient was felt deemed stable for discharge. Patient/family member advised to call PCP or come back to ER if needed.   Discharge Diagnoses:   Principal Diagnosis: Cellulitis of foot  Active Hospital Problems   Diagnosis Date Noted  . Cellulitis of foot 08/02/2019  . CHF (congestive heart failure) ([name]) 04/11/2019  . Idiopathic thrombocytopenic purpura (ITP) (H[facility]) 11/16/2018  . Status post below knee amputation of right lower extremity ([name])   . Diabetic peripheral neuropathy ([name])   . S/P BKA (below knee amputation) unilateral ([name])   . Neurogenic bladder   . Peripheral vascular disease, unspecified ([name]) 04/27/2012  . Acute kidney injury superimposed on chronic kidney disease ([name]) 07/02/2011  . Diabetes mellitus ([name]) 11/22/2010  . Essential hypertension     Resolved Hospital Problems  No resolved problems to display.    Discharge Instructions    Diet Carb Modified   Complete by: As directed    Discharge wound care:   Complete by: As directed    Apply Santyl to left foot anterior and middle yellow and black wounds (MWF or 3 days weekly), cover with moist 2x2 gauze at each location. Apply xeroform gauze to middle foot wound. Cover left foot wounds with kerlex and ace wrap each  time.   Increase activity slowly   Complete by: As directed      Allergies as of 08/08/2019   No Known Allergies     Medication List    STOP taking these medications   cephALEXin 500 MG capsule Commonly known as: KEFLEX   furosemide 40 MG tablet Commonly known as: LASIX   levofloxacin 500 MG  tablet Commonly known as: Levaquin   ondansetron 4 MG disintegrating tablet Commonly known as: ZOFRAN-ODT     TAKE these medications   acetaminophen 500 MG tablet Commonly known as: TYLENOL Take 1,000 mg by mouth every 6 (six) hours as needed for mild pain.   amLODipine 5 MG tablet Commonly known as: NORVASC Take 5 mg by mouth daily. What changed: Another medication with the same name was removed. Continue taking this medication, and follow the directions you see here.   aspirin EC 81 MG tablet Take 81 mg by mouth daily as needed for moderate pain. Swallow whole.   gabapentin 400 MG capsule Commonly known as: NEURONTIN Take 1 capsule (400 mg total) by mouth 2 (two) times daily.   isosorbide mononitrate 30 MG 24 hr tablet Commonly known as: IMDUR Take 1 tablet (30 mg total) by mouth daily.   metFORMIN 500 MG tablet Commonly known as: Glucophage Take 1 tablet (500 mg total) by mouth 2 (two) times daily with a meal. What changed:   when to take this  Another medication with the same name was removed. Continue taking this medication, and follow the directions you see here.   metoprolol succinate 50 MG 24 hr tablet Commonly known as: TOPROL-XL Take 1 tablet (50 mg total) by mouth daily. Take with or immediately following a meal.   Prodigy Autocode Blood Glucose w/Device Kit 1 kit by Does not apply route in the morning, at noon, in the evening, and at bedtime.   rosuvastatin 20 MG tablet Commonly known as: CRESTOR Take 1 tablet (20 mg total) by mouth daily.            Discharge Care Instructions  (From admission, onward)         Start     Ordered   08/08/19 0000  Discharge wound care:       Comments: Apply Santyl to left foot anterior and middle yellow and black wounds (MWF or 3 days weekly), cover with moist 2x2 gauze at each location. Apply xeroform gauze to middle foot wound. Cover left foot wounds with kerlex and ace wrap each time.   08/08/19 1142           Follow-up Information    Call Erle Crocker, MD.   Specialty: Orthopedic Surgery Why: Call for follow up appointment 1-2 weeks from discharge. Contact information: [name] [address] [phone]              No Known Allergies   Discharge Exam: BP (!) 160/72 (BP Location: Right Arm)   Pulse 79   Temp 98.6 F (37 C) (Oral)   Resp 18   Ht '5\' 7"'  (1.702 m)   Wt (!) 95.3 kg   LMP  (LMP Unknown)   SpO2 96%   BMI 32.89 kg/m  General appearance: alert, cooperative and no distress Head: Normocephalic, without obvious abnormality Eyes: EOMI Lungs: clear to auscultation bilaterally Heart: regular rate and rhythm and S1, S2 normal Abdomen: normal findings: bowel sounds normal and soft, non-tender Extremities:  Multiple healing wounds noted on left foot.  Plantar surface noted  with round 3 cm wound with epithelialized skin.  Middle plantar surface appreciated with 2 more separate wounds with adequate healing appreciated and pink granulized tissue Skin: mobility and turgor normal Neurologic: Grossly normal   The results of significant diagnostics from this hospitalization (including imaging, microbiology, ancillary and laboratory) are listed below for reference.   Microbiology: Recent Results (from the past 240 hour(s))  Respiratory Panel by RT PCR (Flu A&B, Covid) - Nasopharyngeal Swab     Status: None   Collection Time: 07/30/19  6:08 PM   Specimen: Nasopharyngeal Swab  Result Value Ref Range Status   SARS Coronavirus 2 by RT PCR NEGATIVE NEGATIVE Final    Comment: (NOTE) SARS-CoV-2 target nucleic acids are NOT DETECTED.  The SARS-CoV-2 RNA is generally detectable in upper respiratoy specimens during the acute phase of infection. The lowest concentration of SARS-CoV-2 viral copies this assay can detect is 131 copies/mL. A negative result does not preclude SARS-Cov-2 infection and should not be used as the sole basis for treatment or other  patient management decisions. A negative result may occur with  improper specimen collection/handling, submission of specimen other than nasopharyngeal swab, presence of viral mutation(s) within the areas targeted by this assay, and inadequate number of viral copies (<131 copies/mL). A negative result must be combined with clinical observations, patient history, and epidemiological information. The expected result is Negative.  Fact Sheet for Patients:  PinkCheek.be  Fact Sheet for Healthcare Providers:  GravelBags.it  This test is no t yet approved or cleared by the [place] FDA and  has been authorized for detection and/or diagnosis of SARS-CoV-2 by FDA under an Emergency Use Authorization (EUA). This EUA will remain  in effect (meaning this test can be used) for the duration of the COVID-19 declaration under Section 564(b)(1) of the Act, 21 U.S.C. section 360bbb-3(b)(1), unless the authorization is terminated or revoked sooner.     Influenza A by PCR NEGATIVE NEGATIVE Final   Influenza B by PCR NEGATIVE NEGATIVE Final    Comment: (NOTE) The Xpert Xpress SARS-CoV-2/FLU/RSV assay is intended as an aid in  the diagnosis of influenza from Nasopharyngeal swab specimens and  should not be used as a sole basis for treatment. Nasal washings and  aspirates are unacceptable for Xpert Xpress SARS-CoV-2/FLU/RSV  testing.  Fact Sheet for Patients: PinkCheek.be  Fact Sheet for Healthcare Providers: GravelBags.it  This test is not yet approved or cleared by the [place] FDA and  has been authorized for detection and/or diagnosis of SARS-CoV-2 by  FDA under an Emergency Use Authorization (EUA). This EUA will remain  in effect (meaning this test can be used) for the duration of the  Covid-19 declaration under Section 564(b)(1) of the Act, 21  U.S.C. section  360bbb-3(b)(1), unless the authorization is  terminated or revoked. Performed at [facility], [place] [address] [place], [place] [address]   Urine culture     Status: None   Collection Time: 07/30/19 10:32 PM   Specimen: Urine, Clean Catch  Result Value Ref Range Status   Specimen Description   Final    URINE, CLEAN CATCH Performed at [facility], [place] [address] [place], [place] [address]    Special Requests   Final    NONE Performed at [facility], [place] [address] [place], [place] [address]    Culture   Final    NO GROWTH Performed at [facility], [place] [address], [place], [place] [address]  Report Status 08/01/2019 FINAL  Final  SARS Coronavirus 2 by RT PCR (hospital order, performed in [facility] hospital lab) Nasopharyngeal Nasopharyngeal Swab     Status: None   Collection Time: 08/02/19 11:09 AM   Specimen: Nasopharyngeal Swab  Result Value Ref Range Status   SARS Coronavirus 2 NEGATIVE NEGATIVE Final    Comment: (NOTE) SARS-CoV-2 target nucleic acids are NOT DETECTED.  The SARS-CoV-2 RNA is generally detectable in upper and lower respiratory specimens during the acute phase of infection. The lowest concentration of SARS-CoV-2 viral copies this assay can detect is 250 copies / mL. A negative result does not preclude SARS-CoV-2 infection and should not be used as the sole basis for treatment or other patient management decisions.  A negative result may occur with improper specimen collection / handling, submission of specimen other than nasopharyngeal swab, presence of viral mutation(s) within the areas targeted by this assay, and inadequate number of viral copies (<250 copies / mL). A negative result must be combined with clinical observations, patient history, and epidemiological information.  Fact Sheet for Patients:   StrictlyIdeas.no  Fact Sheet for Healthcare  Providers: BankingDealers.co.za  This test is not yet approved or  cleared by the [place] FDA and has been authorized for detection and/or diagnosis of SARS-CoV-2 by FDA under an Emergency Use Authorization (EUA).  This EUA will remain in effect (meaning this test can be used) for the duration of the COVID-19 declaration under Section 564(b)(1) of the Act, 21 U.S.C. section 360bbb-3(b)(1), unless the authorization is terminated or revoked sooner.  Performed at [facility], [place] [address], [place], [place] [address]   Blood culture (routine x 2)     Status: None   Collection Time: 08/02/19 11:35 AM   Specimen: BLOOD  Result Value Ref Range Status   Specimen Description   Final    BLOOD OUTSIDE SERVICE OPLAB Performed at [facility] [address] [place], [place] [address]    Special Requests   Final    BOTTLES DRAWN AEROBIC AND ANAEROBIC Blood Culture adequate volume Performed at [place] [address] [place], [place] [address]    Culture   Final    NO GROWTH 5 DAYS Performed at [facility], [place] [address], [place], [place] [address]    Report Status 08/07/2019 FINAL  Final  Blood culture (routine x 2)     Status: None   Collection Time: 08/02/19 11:35 AM   Specimen: BLOOD  Result Value Ref Range Status   Specimen Description   Final    BLOOD RIGHT ANTECUBITAL Performed at [facility] [address], [place], [place] [address]    Special Requests   Final    BOTTLES DRAWN AEROBIC ONLY Blood Culture adequate volume Performed at [facility] [address] [place], [place] [address]    Culture   Final    NO GROWTH 5 DAYS Performed at [facility], [place] [address], [place],  [address]    Report Status 08/07/2019 FINAL  Final  C Difficile Quick Screen w PCR reflex     Status: None   Collection Time: 08/04/19  1:27 PM    Specimen: STOOL  Result Value Ref Range Status   C Diff antigen NEGATIVE NEGATIVE Final   C Diff toxin NEGATIVE NEGATIVE Final   C Diff interpretation No C. difficile detected.  Final    Comment: Performed at [facility], [place] [address], [place], [place] [address]     Labs: BNP (last 3 results) Recent Labs    04/11/19 0351 04/14/19 0842 04/15/19 0418  BNP 912.4* 452.2* 794.8*   Basic Metabolic Panel: Recent Labs  Lab 08/04/19 0500 08/05/19 0507 08/06/19 0504 08/07/19 0358 08/08/19 0256  NA 137 138 139 143 137  K 3.7 4.3 3.8 3.6 3.4*  CL 109 111 110 111 108  CO2 17* 16* 19* 22 24  GLUCOSE 134* 137* 124* 103* 117*  BUN 69* 51* 34* 22* 14  CREATININE 2.00* 1.53* 1.12* 1.05* 0.88  CALCIUM 8.0* 8.1* 8.2* 8.4* 8.3*  MG 1.8 1.6* 2.0  --  1.4*  PHOS 4.3  --  3.3  --   --    Liver Function Tests: Recent Labs  Lab 08/03/19 0434 08/06/19 0504  AST 14* 13*  ALT 7 8  ALKPHOS 51 53  BILITOT 0.6 0.4  PROT 6.8 7.0  ALBUMIN 2.3* 2.2*   No results for input(s): LIPASE, AMYLASE in the last 168 hours. No results for input(s): AMMONIA in the last 168 hours. CBC: Recent Labs  Lab 08/03/19 0434 08/04/19 0500 08/06/19 0504 08/07/19 0358 08/08/19 0256  WBC 17.6* 17.8* 11.1* 11.5* 13.1*  NEUTROABS  --   --   --   --  10.7*  HGB 8.8* 9.0* 8.6* 8.8* 8.6*  HCT 28.6* 28.7* 27.8* 28.1* 27.5*  MCV 90.8 89.4 89.4 88.9 87.6  PLT 408* 402* 377 332 301   Cardiac Enzymes: No results for input(s): CKTOTAL, CKMB, CKMBINDEX, TROPONINI in the last 168 hours. BNP: Invalid input(s): POCBNP CBG: Recent Labs  Lab 08/07/19 1139 08/07/19 1715 08/07/19 2103 08/08/19 0723 08/08/19 1142  GLUCAP 129* 126* 124* 105* 140*   D-Dimer No results for input(s): DDIMER in the last 72 hours. Hgb A1c No results for input(s): HGBA1C in the last 72 hours. Lipid Profile No results for input(s): CHOL, HDL, LDLCALC, TRIG, CHOLHDL, LDLDIRECT in the last 72 hours. Thyroid  function studies No results for input(s): TSH, T4TOTAL, T3FREE, THYROIDAB in the last 72 hours.  Invalid input(s): FREET3 Anemia work up No results for input(s): VITAMINB12, FOLATE, FERRITIN, TIBC, IRON, RETICCTPCT in the last 72 hours. Urinalysis    Component Value Date/Time   COLORURINE YELLOW 07/30/2019 2232   APPEARANCEUR CLEAR 07/30/2019 2232   LABSPEC 1.019 07/30/2019 2232   PHURINE 5.0 07/30/2019 2232   GLUCOSEU NEGATIVE 07/30/2019 2232   HGBUR NEGATIVE 07/30/2019 2232   BILIRUBINUR NEGATIVE 07/30/2019 2232   KETONESUR NEGATIVE 07/30/2019 2232   PROTEINUR 100 (A) 07/30/2019 2232   UROBILINOGEN 2.0 (H) 07/08/2011 0051   NITRITE NEGATIVE 07/30/2019 2232   LEUKOCYTESUR NEGATIVE 07/30/2019 2232   Sepsis Labs Invalid input(s): PROCALCITONIN,  WBC,  LACTICIDVEN Microbiology Recent Results (from the past 240 hour(s))  Respiratory Panel by RT PCR (Flu A&B, Covid) - Nasopharyngeal Swab     Status: None   Collection Time: 07/30/19  6:08 PM   Specimen: Nasopharyngeal Swab  Result Value Ref Range Status   SARS Coronavirus 2 by RT PCR NEGATIVE NEGATIVE Final    Comment: (NOTE) SARS-CoV-2 target nucleic acids are NOT DETECTED.  The SARS-CoV-2 RNA is generally detectable in upper respiratoy specimens during the acute phase of infection. The lowest concentration of SARS-CoV-2 viral copies this assay can detect is 131 copies/mL. A negative result does not preclude SARS-Cov-2 infection and should not be used as the sole basis for treatment or other patient management decisions. A negative result may occur with  improper specimen collection/handling, submission of specimen other than nasopharyngeal swab, presence of viral mutation(s) within the areas targeted by this assay, and inadequate number of viral copies (<131 copies/mL). A negative result must be combined with clinical observations, patient history, and epidemiological information. The expected result is Negative.  Fact  Sheet for Patients:  PinkCheek.be  Fact Sheet for Healthcare Providers:  GravelBags.it  This test is no t yet approved or cleared by the [place] FDA and  has been authorized for detection and/or diagnosis of SARS-CoV-2 by FDA under an Emergency Use Authorization (EUA). This EUA will remain  in effect (meaning this test can be used) for the duration of the COVID-19 declaration under Section 564(b)(1) of the Act, 21 U.S.C. section 360bbb-3(b)(1), unless the authorization is terminated or revoked sooner.     Influenza A by PCR NEGATIVE NEGATIVE Final   Influenza B by PCR NEGATIVE NEGATIVE Final    Comment: (NOTE) The Xpert Xpress SARS-CoV-2/FLU/RSV assay is intended as an aid in  the diagnosis of influenza from Nasopharyngeal swab specimens and  should not be used as a sole basis for treatment. Nasal washings and  aspirates are unacceptable for Xpert Xpress SARS-CoV-2/FLU/RSV  testing.  Fact Sheet for Patients: PinkCheek.be  Fact Sheet for Healthcare Providers: GravelBags.it  This test is not yet approved or cleared by the [place] FDA and  has been authorized for detection and/or diagnosis of SARS-CoV-2 by  FDA under an Emergency Use Authorization (EUA). This EUA will remain  in effect (meaning this test can be used) for the duration of the  Covid-19 declaration under Section 564(b)(1) of the Act, 21  U.S.C. section 360bbb-3(b)(1), unless the authorization is  terminated or revoked. Performed at [facility], [place] [address] [place], [place] [address]   Urine culture     Status: None   Collection Time: 07/30/19 10:32 PM   Specimen: Urine, Clean Catch  Result Value Ref Range Status   Specimen Description   Final    URINE, CLEAN CATCH Performed at [facility], [place] [address], [place], [place] [address]     Special Requests   Final    NONE Performed at [facility], [place] [address] [place], [place] [address]    Culture   Final    NO GROWTH Performed at [facility], [place] [address], [place], [place] [address]    Report Status 08/01/2019 FINAL  Final  SARS Coronavirus 2 by RT PCR (hospital order, performed in [facility] hospital lab) Nasopharyngeal Nasopharyngeal Swab     Status: None   Collection Time: 08/02/19 11:09 AM   Specimen: Nasopharyngeal Swab  Result Value Ref Range Status   SARS Coronavirus 2 NEGATIVE NEGATIVE Final    Comment: (NOTE) SARS-CoV-2 target nucleic acids are NOT DETECTED.  The SARS-CoV-2 RNA is generally detectable in upper and lower respiratory specimens during the acute phase of infection. The lowest concentration of SARS-CoV-2 viral copies this assay can detect is 250 copies / mL. A negative result does not preclude SARS-CoV-2 infection and should not be used as the sole basis for treatment or other patient management decisions.  A negative result may occur with improper specimen collection / handling, submission of specimen other than nasopharyngeal swab, presence of viral mutation(s) within the areas targeted by this assay, and inadequate number of viral copies (<250 copies / mL). A negative result must be combined with clinical observations, patient history, and epidemiological information.  Fact Sheet for  Patients:   StrictlyIdeas.no  Fact Sheet for Healthcare Providers: BankingDealers.co.za  This test is not yet approved or  cleared by the [place] FDA and has been authorized for detection and/or diagnosis of SARS-CoV-2 by FDA under an Emergency Use Authorization (EUA).  This EUA will remain in effect (meaning this test can be used) for the duration of the COVID-19 declaration under Section 564(b)(1) of the Act, 21 U.S.C. section 360bbb-3(b)(1), unless the authorization is  terminated or revoked sooner.  Performed at [facility], [place] [address], [place], [place] [address]   Blood culture (routine x 2)     Status: None   Collection Time: 08/02/19 11:35 AM   Specimen: BLOOD  Result Value Ref Range Status   Specimen Description   Final    BLOOD OUTSIDE SERVICE OPLAB Performed at [facility] [address], [place], [place] [address]    Special Requests   Final    BOTTLES DRAWN AEROBIC AND ANAEROBIC Blood Culture adequate volume Performed at [facility] [address], [place], [place] [address]    Culture   Final    NO GROWTH 5 DAYS Performed at [facility], [place] [address] [place], [place] [address]    Report Status 08/07/2019 FINAL  Final  Blood culture (routine x 2)     Status: None   Collection Time: 08/02/19 11:35 AM   Specimen: BLOOD  Result Value Ref Range Status   Specimen Description   Final    BLOOD RIGHT ANTECUBITAL Performed at [facility] [address] [place], [place] [address]    Special Requests   Final    BOTTLES DRAWN AEROBIC ONLY Blood Culture adequate volume Performed at [facility] [address] [place], [place] [address]    Culture   Final    NO GROWTH 5 DAYS Performed at [facility], [place] [address], [place], [place] [address]    Report Status 08/07/2019 FINAL  Final  C Difficile Quick Screen w PCR reflex     Status: None   Collection Time: 08/04/19  1:27 PM   Specimen: STOOL  Result Value Ref Range Status   C Diff antigen NEGATIVE NEGATIVE Final   C Diff toxin NEGATIVE NEGATIVE Final   C Diff interpretation No C. difficile detected.  Final    Comment: Performed at [facility], [place] [address] [place], [place] [address]    Procedures/Studies: CT Head Wo Contrast  Result Date: 07/30/2019 CLINICAL DATA:  Headache. Right-sided head pain since Wednesday. Fever. EXAM: CT HEAD  WITHOUT CONTRAST TECHNIQUE: Contiguous axial images were obtained from the base of the skull through the vertex without intravenous contrast. COMPARISON:  11/05/2018 FINDINGS: Brain: Encephalomalacia again identified within left parietal and temporal regions. Significantly age advanced cerebral and cerebellar atrophy. No mass lesion, hemorrhage, hydrocephalus, acute infarct, intra-axial, or extra-axial fluid collection. Vascular: No hyperdense vessel or unexpected calcification. Skull: Left frontal parietal craniotomy. Sinuses/Orbits: Normal imaged portions of the orbits and globes. Clear paranasal sinuses and mastoid air cells. Other: None. IMPRESSION: 1. No acute intracranial abnormality. 2. Left-sided encephalomalacia, as before. 3. Significantly age advanced cerebral and cerebellar atrophy. Electronically Signed   By: Abigail Miyamoto M.D.   On: 07/30/2019 18:58   DG Chest Port 1 View  Result Date: 07/30/2019 CLINICAL DATA:  Fever. EXAM: PORTABLE CHEST 1 VIEW COMPARISON:  Prior chest radiographs 05/16/2019 and earlier. FINDINGS: Unchanged cardiomegaly. Mild central pulmonary vascular congestion.  No appreciable airspace consolidation or frank pulmonary edema. No evidence of pleural effusion or pneumothorax. No acute bony abnormality is identified. IMPRESSION: Unchanged cardiomegaly with mild central pulmonary vascular congestion. No appreciable airspace consolidation or frank pulmonary edema. Electronically Signed   By: Kellie Simmering DO   On: 07/30/2019 17:53   DG Foot Complete Left  Result Date: 08/02/2019 CLINICAL DATA:  Plantar ulceration. EXAM: LEFT FOOT - COMPLETE 3+ VIEW COMPARISON:  None. FINDINGS: There is no evidence of fracture or dislocation. There is no evidence of arthropathy or other focal bone abnormality. There is mild to moderate severity vascular calcification. Marked severity diffuse soft tissue swelling is seen. A 1.8 cm superficial soft tissue defect is seen along the plantar aspect of the  distal left foot. IMPRESSION: Marked severity diffuse soft tissue swelling and superficial plantar ulceration without evidence of an acute osseous abnormality. Electronically Signed   By: Virgina Norfolk M.D.   On: 08/02/2019 12:06     Time coordinating discharge: Over 30 minutes    [name], MD  [facility] 08/09/2019, 5:38 PM Pager: Secure chat  If 7PM-7AM, please contact night-coverage [email or web address] Password [email or web address]

## 2019-08-09 NOTE — Progress Notes (Signed)
   [facility] Patient [facility] [address] [place], [place]  [address] Phone:  [phone]   Fax:  [phone]  Patient refused to take Augmentin states that it makes her sick. Doxycycline 100mg  #10 Bid sent

## 2019-08-10 ENCOUNTER — Telehealth: Payer: Medicare Other | Admitting: Nurse Practitioner

## 2019-08-10 ENCOUNTER — Other Ambulatory Visit: Payer: Self-pay

## 2019-08-10 ENCOUNTER — Telehealth: Payer: Self-pay | Admitting: Licensed Clinical Social Worker

## 2019-08-10 NOTE — Telephone Encounter (Signed)
[facility] Clinical Social Work  Clinical Social Work was referred by Lajuana Carry for assessment of psychosocial needs. Patient discharged from hospital on 8/4 with home health nursing, PT, and social work in place.  Clinical Social Worker attempted to contact patient by phone  to offer support and assess for needs since discharge. No answer. Left VM with direct contact information.     [name], Shanette E, LCSW  Clinical Social Worker [facility]

## 2019-08-14 ENCOUNTER — Encounter: Payer: Self-pay | Admitting: Licensed Clinical Social Worker

## 2019-08-14 NOTE — Progress Notes (Signed)
[facility] CSW Progress Note  Clinical Child psychotherapist contacted patient by phone to check on needs post-discharge. Patient reports that it is going fairly well and home health has been coming to help. Has an appt for her eye surgery Thursday at 10:30 with [facility]. She is asking about transportation as she thought she could use [facility] transport like she does for appts at [facility]. CSW explained that that is approved for rides to cancer center. Patient is also already signed up with [facility]. CSW encouraged use of that program for non-cancer center appointments.    Terrance Mass Jeffie Widdowson , LCSW

## 2019-08-17 ENCOUNTER — Other Ambulatory Visit: Payer: Self-pay | Admitting: Family Medicine

## 2019-08-17 ENCOUNTER — Encounter: Payer: Self-pay | Admitting: Family Medicine

## 2019-08-17 NOTE — Progress Notes (Signed)
   [facility]     [name] is a 54 year old female with a medical history significant for CHF, anemia of chronic disease, chronic indwelling foley catheter, and diabetic foot ulcer associated with type 2 diabetes mellitus is followed by [facility], 3 times per week for wound care. Marcelino Duster, RN called stating that patient has a worsening wound to left plantar aspect of foot. RN gave the following description:  Left plantar aspect of foot. 10 cm X 8 cm wound, minimal serous draining, no odor, edema, no erythema. surrounding skin is intact.  Left great toe, quarter sized, necrotic, no drainage, surrounding skin is intact.   Advised RN that patient will need a first available appointment with Thad Ranger, PCP. She will warrant a referral to wound care and further assessment of type 2 diabetes mellitus.     Nolon Nations  APRN, MSN, FNP-C Patient Care [facility] [address] [place], [place] [address] [phone]

## 2019-08-22 ENCOUNTER — Ambulatory Visit (INDEPENDENT_AMBULATORY_CARE_PROVIDER_SITE_OTHER): Payer: Medicare Other | Admitting: Nurse Practitioner

## 2019-08-22 ENCOUNTER — Encounter: Payer: Self-pay | Admitting: Nurse Practitioner

## 2019-08-22 ENCOUNTER — Other Ambulatory Visit: Payer: Self-pay

## 2019-08-22 VITALS — BP 135/65 | HR 67 | Temp 97.7°F

## 2019-08-22 DIAGNOSIS — D693 Immune thrombocytopenic purpura: Secondary | ICD-10-CM

## 2019-08-22 DIAGNOSIS — N189 Chronic kidney disease, unspecified: Secondary | ICD-10-CM

## 2019-08-22 DIAGNOSIS — E11621 Type 2 diabetes mellitus with foot ulcer: Secondary | ICD-10-CM

## 2019-08-22 DIAGNOSIS — D631 Anemia in chronic kidney disease: Secondary | ICD-10-CM

## 2019-08-22 DIAGNOSIS — L97422 Non-pressure chronic ulcer of left heel and midfoot with fat layer exposed: Secondary | ICD-10-CM | POA: Diagnosis not present

## 2019-08-22 LAB — POCT GLYCOSYLATED HEMOGLOBIN (HGB A1C)
HbA1c POC (<> result, manual entry): 5.5 % (ref 4.0–5.6)
HbA1c, POC (controlled diabetic range): 5.5 % (ref 0.0–7.0)
HbA1c, POC (prediabetic range): 5.5 % — AB (ref 5.7–6.4)
Hemoglobin A1C: 5.5 % (ref 4.0–5.6)

## 2019-08-22 NOTE — Progress Notes (Signed)
[place], [place]  [address] Phone:  [phone]   Fax:  [phone]   Established Patient Office Visit  Subjective:  Patient ID: [name], female    DOB: [birth year]  Age: 54 y.o. MRN: [id number]  CC:  Chief Complaint  Patient presents with  . Follow-up    left foot blister, needs a referral to wound center    HPI Liz Malady presents for follow up. She  has a past medical history of ITP,  Acute exacerbation of CHF (congestive heart failure) ([place]) (04/11/2019), Anemia, Arthritis, Back pain, Chronic indwelling Foley catheter (03/04/2017), Diabetic foot ulcer associated with type 2 diabetes mellitus ([place]), Hypertension, Intracerebral hemorrhage ([place]) (As a teenager ), Neuropathy, Stroke ([place]) (~ 1982), and Type II diabetes mellitus ([place]).   Foot Ulcer Patient complains of a foot ulcer.  She was seen at the emergency room on 07/31/2019.  She was treated and released.  She returned to the emergency room on 08/02/2019 and was admitted.  She was released on 08/09/2019 on Augmentin.  She refused to take the medication because it made her sick.  Her medication was then changed to doxycycline 100 mg twice daily for 5 days.  The patient has had an ulcer of left lateral malleolus for 3 weeks. This has been treated at home, in the hospital with wet to dry dressing changes. Past history significant for diabetes and prior amputation of right BKA. The patient reports drainage and swelling and denies fever. Patient does have a history of diabetes mellitus.  Left foot x-ray completed on 08/08/2019 Marked severity diffuse soft tissue swelling and superficial plantar ulceration without evidence of an acute osseous abnormality.  She has been followed closely by home health.   Past Medical History:  Diagnosis Date  . Acute exacerbation of CHF (congestive heart failure) ([place]) 04/11/2019  . Anemia   . Arthritis    "spine, hands" (11/25/2015)  . Back pain     "all my back; probably 3 times/week" (11/25/2015)  . Chronic indwelling Foley catheter 03/04/2017  . Diabetic foot ulcer associated with type 2 diabetes mellitus ([place])   . Hypertension   . Intracerebral hemorrhage ([place]) As a teenager    States she had burst blood vessel as teenager, now with resultant minor visual field and hearing deficits  . Neuropathy   . Stroke [facility]) ~ 1982   "my feeling on my RLE, right lower eye vision,  & hearing out of my right ear not the same since" (11/25/2015)  . Type II diabetes mellitus ([place])     Past Surgical History:  Procedure Laterality Date  . AMPUTATION  11/24/2010   Procedure: AMPUTATION FOOT;  Surgeon: Wylene Simmer, MD;  Location: [place];  Service: Orthopedics;  Laterality: Right;  Right  FOOT TRANS METATARSAL AMPUTATION  . AMPUTATION  07/02/2011   Procedure: AMPUTATION BELOW KNEE;  Surgeon: Wylene Simmer, MD;  Location: [place];  Service: Orthopedics;  Laterality: Right;  . APPLICATION OF WOUND VAC  07/02/2011   Procedure: APPLICATION OF WOUND VAC;  Surgeon: Wylene Simmer, MD;  Location: [place];  Service: Orthopedics;  Laterality: Right;  . BRAIN SURGERY  1980's   Previous brain surgery in [place], [place]  . CESAREAN SECTION  2004; 2006  . COLONOSCOPY N/A 09/17/2015   Procedure: COLONOSCOPY;  Surgeon: Wilford Corner, MD;  Location[facility] ENDOSCOPY;  Service: Endoscopy;  Laterality: N/A;  . ESOPHAGOGASTRODUODENOSCOPY N/A 09/17/2015   Procedure: ESOPHAGOGASTRODUODENOSCOPY (EGD);  Surgeon:  Wilford Corner, MD;  Location: [facility] ENDOSCOPY;  Service: Endoscopy;  Laterality: N/A;  . I & D EXTREMITY  11/24/2010   Procedure: IRRIGATION AND DEBRIDEMENT EXTREMITY;  Surgeon: Wylene Simmer, MD;  Location: [place];  Service: Orthopedics;  Laterality: Left;  Debriedment of left plantar ulcer and trimming of toenails  . I & D EXTREMITY  07/02/2011   Procedure: IRRIGATION AND DEBRIDEMENT EXTREMITY;  Surgeon: Wylene Simmer, MD;  Location: [place];  Service: Orthopedics;  Laterality: Left;  I  & D of Left Foot  . I & D EXTREMITY  07/06/2011   Procedure: IRRIGATION AND DEBRIDEMENT EXTREMITY;  Surgeon: Wylene Simmer, MD;  Location: [place];  Service: Orthopedics;  Laterality: Right;  IRRIGATION/DEBRIDEMENT RIGHT BELOW KNEE AMPUTATION  . TUBAL LIGATION  2006    Family History  Problem Relation Age of Onset  . Diabetes Mother   . Hypertension Mother   . Hyperlipidemia Mother   . Diabetes Father   . Cancer Father        prostate  . Hyperlipidemia Father   . Hypertension Father   . Diabetes Brother   . Peripheral Artery Disease Brother        has had toes amputated  . Diabetes Son     Social History   Socioeconomic History  . Marital status: Widowed    Spouse name: Not on file  . Number of children: Not on file  . Years of education: Not on file  . Highest education level: Not on file  Occupational History  . Not on file  Tobacco Use  . Smoking status: Never Smoker  . Smokeless tobacco: Never Used  Vaping Use  . Vaping Use: Never used  Substance and Sexual Activity  . Alcohol use: Yes    Alcohol/week: 2.0 standard drinks    Types: 2 Glasses of wine per week  . Drug use: No  . Sexual activity: Not Currently    Birth control/protection: Post-menopausal  Other Topics Concern  . Not on file  Social History Narrative   Widowed in 2012   Lives in a home she owns   No wheelchair ramp, though garage entry is flush to both garage and entry floor.   Unable to use full bathroom upstairs.   Has prosthesis for right LE, but never learned how to use.   Cannot see well due to cataracts.   Did not receive disability in 0/8676 with application.   Possibly due to husband's SS she receives   Social Determinants of Radio broadcast assistant Strain:   . Difficulty of Paying Living Expenses: Not on file  Food Insecurity:   . Worried About Charity fundraiser in the Last Year: Not on file  . Ran Out of Food in the Last Year: Not on file  Transportation Needs:   . Lack of  Transportation (Medical): Not on file  . Lack of Transportation (Non-Medical): Not on file  Physical Activity:   . Days of Exercise per Week: Not on file  . Minutes of Exercise per Session: Not on file  Stress:   . Feeling of Stress : Not on file  Social Connections:   . Frequency of Communication with Friends and Family: Not on file  . Frequency of Social Gatherings with Friends and Family: Not on file  . Attends Religious Services: Not on file  . Active Member of Clubs or Organizations: Not on file  . Attends Archivist Meetings: Not on file  . Marital Status:  Not on file  Intimate Partner Violence:   . Fear of Current or Ex-Partner: Not on file  . Emotionally Abused: Not on file  . Physically Abused: Not on file  . Sexually Abused: Not on file    Outpatient Medications Prior to Visit  Medication Sig Dispense Refill  . acetaminophen (TYLENOL) 500 MG tablet Take 1,000 mg by mouth every 6 (six) hours as needed for mild pain.    Marland Kitchen amLODipine (NORVASC) 5 MG tablet Take 5 mg by mouth daily.    Marland Kitchen aspirin EC 81 MG tablet Take 81 mg by mouth daily as needed for moderate pain. Swallow whole.    . Blood Glucose Monitoring Suppl (PRODIGY AUTOCODE BLOOD GLUCOSE) w/Device KIT 1 kit by Does not apply route in the morning, at noon, in the evening, and at bedtime. 1 kit 0  . gabapentin (NEURONTIN) 400 MG capsule Take 1 capsule (400 mg total) by mouth 2 (two) times daily. 60 capsule 0  . isosorbide mononitrate (IMDUR) 30 MG 24 hr tablet Take 1 tablet (30 mg total) by mouth daily. 90 tablet 1  . metoprolol succinate (TOPROL-XL) 50 MG 24 hr tablet Take 1 tablet (50 mg total) by mouth daily. Take with or immediately following a meal. 90 tablet 1  . rosuvastatin (CRESTOR) 20 MG tablet Take 1 tablet (20 mg total) by mouth daily. 90 tablet 1  . metFORMIN (GLUCOPHAGE) 500 MG tablet Take 1 tablet (500 mg total) by mouth 2 (two) times daily with a meal. (Patient taking differently: Take 500 mg by  mouth daily with breakfast. ) 60 tablet 1  . ofloxacin (OCUFLOX) 0.3 % ophthalmic solution Place into the right eye.    . prednisoLONE acetate (PRED FORTE) 1 % ophthalmic suspension Place into the right eye.     No facility-administered medications prior to visit.    No Known Allergies  ROS Review of Systems    Objective:    Physical Exam Constitutional:      General: She is not in acute distress.    Appearance: She is not ill-appearing, toxic-appearing or diaphoretic.  HENT:     Head: Normocephalic.  Musculoskeletal:     Cervical back: Normal range of motion.  Neurological:     Mental Status: She is alert and oriented to person, place, and time.  Psychiatric:        Mood and Affect: Mood normal.     Comments: Very concern about her foot and future        BP 135/65   Pulse 67   Temp 97.7 F (36.5 C) (Temporal)   LMP  (LMP Unknown)   SpO2 (!) 67%  Wt Readings from Last 3 Encounters:  08/02/19 (!) 210 lb (95.3 kg)  07/24/19 219 lb (99.3 kg)  06/28/19 210 lb 5.1 oz (95.4 kg)     Health Maintenance Due  Topic Date Due  . FOOT EXAM  Never done  . OPHTHALMOLOGY EXAM  Never done  . URINE MICROALBUMIN  Never done  . COVID-19 Vaccine (1) Never done  . TETANUS/TDAP  Never done  . PAP SMEAR-Modifier  03/02/2015  . MAMMOGRAM  Never done  . INFLUENZA VACCINE  08/05/2019    There are no preventive care reminders to display for this patient.  Lab Results  Component Value Date   TSH 3.037 04/11/2019   Lab Results  Component Value Date   WBC 13.1 (H) 08/08/2019   HGB 8.6 (L) 08/08/2019   HCT 27.5 (L) 08/08/2019  MCV 87.6 08/08/2019   PLT 301 08/08/2019   Lab Results  Component Value Date   NA 137 08/08/2019   K 3.4 (L) 08/08/2019   CHLORIDE 108 09/30/2015   CO2 24 08/08/2019   GLUCOSE 117 (H) 08/08/2019   BUN 14 08/08/2019   CREATININE 0.88 08/08/2019   BILITOT 0.4 08/06/2019   ALKPHOS 53 08/06/2019   AST 13 (L) 08/06/2019   ALT 8 08/06/2019   PROT  7.0 08/06/2019   ALBUMIN 2.2 (L) 08/06/2019   CALCIUM 8.3 (L) 08/08/2019   ANIONGAP 5 08/08/2019   EGFR 54 (L) 09/30/2015   Lab Results  Component Value Date   CHOL 160 11/02/2018   Lab Results  Component Value Date   HDL 62 11/02/2018   Lab Results  Component Value Date   LDLCALC 82 11/02/2018   Lab Results  Component Value Date   TRIG 89 11/02/2018   Lab Results  Component Value Date   CHOLHDL 3.1 08/26/2015   Lab Results  Component Value Date   HGBA1C 5.5 08/22/2019   HGBA1C 5.5 08/22/2019   HGBA1C 5.5 (A) 08/22/2019   HGBA1C 5.5 08/22/2019      Assessment & Plan:   Problem List Items Addressed This Visit      Endocrine   Diabetic foot ulcer associated with type 2 diabetes mellitus ([place]) - Primary Continue with current dressing changes with home health as scheduled   Relevant Orders   Ambulatory referral to [facility] appointment 09/24/2019 Will try to get patient another wound care appointment at a different office due to the urgency   POCT HgB A1C (Completed) Encourage compliance with current treatment regimen  Encourage regular CBG monitoring Encourage contacting office if excessive hyperglycemia and or hypoglycemia Lifestyle modification with healthy diet (fewer calories, more high fiber foods, whole grains and non-starchy vegetables, lower fat meat and fish, low-fat diary include healthy oils) regular exercise (physical activity) and weight loss      Musculoskeletal and Integument   Idiopathic thrombocytopenic purpura (ITP) ([facility]) Continue to follow-up with hematology as scheduled     Other   Absolute anemia   Relevant Orders   CBC with Differential/Platelet      No orders of the defined types were placed in this encounter.   Follow-up: Return in about 1-3 months (around 11/22/2019).    Vevelyn Francois, NP

## 2019-08-23 ENCOUNTER — Ambulatory Visit: Payer: Medicare Other | Admitting: Nurse Practitioner

## 2019-08-23 ENCOUNTER — Encounter: Payer: Self-pay | Admitting: Nurse Practitioner

## 2019-08-27 ENCOUNTER — Telehealth: Payer: Self-pay | Admitting: Hematology and Oncology

## 2019-08-27 ENCOUNTER — Encounter: Payer: Self-pay | Admitting: *Deleted

## 2019-08-27 ENCOUNTER — Other Ambulatory Visit: Payer: Self-pay | Admitting: *Deleted

## 2019-08-27 DIAGNOSIS — D693 Immune thrombocytopenic purpura: Secondary | ICD-10-CM

## 2019-08-27 NOTE — Telephone Encounter (Signed)
Scheduled appt per 8/23 sch msg - pt is aware of appt date and time  ° °

## 2019-08-27 NOTE — Progress Notes (Signed)
Received a call from [place], NP with [facility] [phone] ext [phone]).  Stating the office is needing medical clearance for pt to receive second cataract removal surgery on 09/14/19.  Per MD pt needing lab and MD apt next week to evaluate plt levels and decide if pt qualifies for surgery.  Morrie Sheldon, NP states her office will fax a medical clearance letter that will need to be completed and signed by Dr. Pamelia Hoit after pts visit. Scheduling message sent for pt to be seen next week.

## 2019-08-30 ENCOUNTER — Other Ambulatory Visit: Payer: Self-pay | Admitting: Internal Medicine

## 2019-08-30 ENCOUNTER — Encounter (HOSPITAL_BASED_OUTPATIENT_CLINIC_OR_DEPARTMENT_OTHER): Payer: Medicare Other | Attending: Internal Medicine | Admitting: Internal Medicine

## 2019-08-30 ENCOUNTER — Other Ambulatory Visit (HOSPITAL_COMMUNITY)
Admission: RE | Admit: 2019-08-30 | Discharge: 2019-08-30 | Disposition: A | Discharge status: Home / Self Care | Payer: Medicare Other | Source: Other Acute Inpatient Hospital | Attending: Internal Medicine | Admitting: Internal Medicine

## 2019-08-30 ENCOUNTER — Other Ambulatory Visit: Payer: Self-pay

## 2019-08-30 ENCOUNTER — Other Ambulatory Visit (HOSPITAL_COMMUNITY): Payer: Self-pay | Admitting: Internal Medicine

## 2019-08-30 DIAGNOSIS — Z89511 Acquired absence of right leg below knee: Secondary | ICD-10-CM | POA: Insufficient documentation

## 2019-08-30 DIAGNOSIS — L97509 Non-pressure chronic ulcer of other part of unspecified foot with unspecified severity: Secondary | ICD-10-CM

## 2019-08-30 DIAGNOSIS — E1169 Type 2 diabetes mellitus with other specified complication: Secondary | ICD-10-CM | POA: Insufficient documentation

## 2019-08-30 DIAGNOSIS — D649 Anemia, unspecified: Secondary | ICD-10-CM | POA: Insufficient documentation

## 2019-08-30 DIAGNOSIS — L97523 Non-pressure chronic ulcer of other part of left foot with necrosis of muscle: Secondary | ICD-10-CM | POA: Diagnosis not present

## 2019-08-30 DIAGNOSIS — Z8249 Family history of ischemic heart disease and other diseases of the circulatory system: Secondary | ICD-10-CM | POA: Insufficient documentation

## 2019-08-30 DIAGNOSIS — I872 Venous insufficiency (chronic) (peripheral): Secondary | ICD-10-CM | POA: Diagnosis not present

## 2019-08-30 DIAGNOSIS — Z888 Allergy status to other drugs, medicaments and biological substances status: Secondary | ICD-10-CM | POA: Insufficient documentation

## 2019-08-30 DIAGNOSIS — D693 Immune thrombocytopenic purpura: Secondary | ICD-10-CM | POA: Insufficient documentation

## 2019-08-30 DIAGNOSIS — I130 Hypertensive heart and chronic kidney disease with heart failure and stage 1 through stage 4 chronic kidney disease, or unspecified chronic kidney disease: Secondary | ICD-10-CM | POA: Insufficient documentation

## 2019-08-30 DIAGNOSIS — E1142 Type 2 diabetes mellitus with diabetic polyneuropathy: Secondary | ICD-10-CM | POA: Insufficient documentation

## 2019-08-30 DIAGNOSIS — Z833 Family history of diabetes mellitus: Secondary | ICD-10-CM | POA: Insufficient documentation

## 2019-08-30 DIAGNOSIS — E1122 Type 2 diabetes mellitus with diabetic chronic kidney disease: Secondary | ICD-10-CM | POA: Insufficient documentation

## 2019-08-30 DIAGNOSIS — Z993 Dependence on wheelchair: Secondary | ICD-10-CM | POA: Insufficient documentation

## 2019-08-30 DIAGNOSIS — I5032 Chronic diastolic (congestive) heart failure: Secondary | ICD-10-CM | POA: Insufficient documentation

## 2019-08-30 DIAGNOSIS — E11621 Type 2 diabetes mellitus with foot ulcer: Secondary | ICD-10-CM | POA: Diagnosis present

## 2019-08-30 DIAGNOSIS — E1136 Type 2 diabetes mellitus with diabetic cataract: Secondary | ICD-10-CM | POA: Insufficient documentation

## 2019-08-30 DIAGNOSIS — N183 Chronic kidney disease, stage 3 unspecified: Secondary | ICD-10-CM | POA: Insufficient documentation

## 2019-08-30 DIAGNOSIS — E1151 Type 2 diabetes mellitus with diabetic peripheral angiopathy without gangrene: Secondary | ICD-10-CM | POA: Insufficient documentation

## 2019-08-30 DIAGNOSIS — M86672 Other chronic osteomyelitis, left ankle and foot: Secondary | ICD-10-CM | POA: Insufficient documentation

## 2019-08-30 DIAGNOSIS — Z8673 Personal history of transient ischemic attack (TIA), and cerebral infarction without residual deficits: Secondary | ICD-10-CM | POA: Insufficient documentation

## 2019-09-01 NOTE — Progress Notes (Signed)
[name], [name] ([id number]) Visit Report for 08/30/2019 Allergy List Details Patient Name: Date of Service: [name], [name] 08/30/2019 10:30 A M Medical Record Number: [id number] Patient Account Number: [id number] Date of Birth/Sex: Treating RN: [birth year] (54 y.o. [name] Primary Care [name]: [name] Other Clinician: Referring [name]: Treating [name]/Extender: [name], [name]: 0 Allergies Active Allergies No Known Allergies Allergy Notes Electronic Signature(s) Signed: 08/31/2019 5:56:33 PM Wolf: [name]: [name] on 08/30/2019 10:55:59 -------------------------------------------------------------------------------- Arrival Information Details Patient Name: Date of Service: [name]. 08/30/2019 10:30 A M Medical Record Number: [id number] Patient Account Number: [id number] Date of Birth/Sex: Treating RN: 1965/06/21 (54 y.o. [name] Primary Care Electa Sterry: [name] Other Clinician: Referring Demarcus Thielke: Treating Chauntay Paszkiewicz/Extender: [name], Hope Budds Weeks in Wolf: 0 Visit Information Patient Arrived: Wheel Chair Arrival Time: 10:48 Accompanied Wolf: self Transfer Assistance: None Patient Identification Verified: Yes Secondary Verification Process Completed: Yes Electronic Signature(s) Signed: 08/31/2019 5:56:33 PM Wolf: [name]: [name] on 08/30/2019 10:54:46 -------------------------------------------------------------------------------- Clinic Level of Care Assessment Details Patient Name: Date of Service: [name] 08/30/2019 10:30 A M Medical Record Number: [id number] Patient Account Number: [id number] Date of Birth/Sex: Treating RN: 1965/11/15 (54 y.o. Cheryl Link Primary Care Nikitas Davtyan: [name] Other Clinician: Referring Ronel Rodeheaver: Treating Moet Mikulski/Extender: [name], Hope Budds Weeks in Wolf: 0 Clinic Level of Care Assessment Items TOOL 1 Quantity Score X- 1 0 Use when EandM and Procedure is performed on INITIAL visit ASSESSMENTS - Nursing Assessment / Reassessment X- 1 20 General Physical Exam (combine w/ comprehensive assessment (listed just below) when performed on new pt. evals) X- 1 25 Comprehensive Assessment (HX, ROS, Risk Assessments, Wounds Hx, etc.) ASSESSMENTS - Wound and Skin Assessment / Reassessment []  - 0 Dermatologic / Skin Assessment (not related to wound area) ASSESSMENTS - Ostomy and/or Continence Assessment and Care []  - 0 Incontinence Assessment and Management []  - 0 Ostomy Care Assessment and Management (repouching, etc.) PROCESS - Coordination of Care X - Simple Patient / Family Education for ongoing care 1 15 []  - 0 Complex (extensive) Patient / Family Education for ongoing care X- 1 10 Staff obtains , Records, T Results / Process Orders est X- 1 10 Staff telephones HHA, Nursing Homes / Clarify orders / etc []  - 0 Routine Transfer to another Facility (non-emergent condition) []  - 0 Routine Hospital Admission (non-emergent condition) X- 1 15 New Admissions / / Ordering NPWT Apligraf, etc. , []  - 0 Emergency Hospital Admission (emergent condition) PROCESS - Special Needs []  - 0 Pediatric / Minor Patient Management []  - 0 Isolation Patient Management []  - 0 Hearing / Language / Visual special needs []  - 0 Assessment of Community assistance (transportation, D/C planning, etc.) []  - 0 Additional assistance / Altered mentation []  - 0 Support Surface(s) Assessment (bed, cushion, seat, etc.) INTERVENTIONS - Miscellaneous []  - 0 External ear exam []  - 0 Patient Transfer (multiple staff / / Similar devices) []  - 0 Simple Staple / Suture removal (25 or less) []  - 0 Complex Staple / Suture removal (26 or more) []  - 0 Hypo/Hyperglycemic Management (do not check if  billed separately) X- 1 15 Ankle / Brachial Index (ABI) - do not check if billed separately Has the patient been seen at the hospital within the last three years: Yes Total Score: 110 Level Of Care: New/Established - Level 3 Electronic Signature(s)  Signed: 08/30/2019 4:47:33 PM Wolf: [name], [name] Entered Wolf: [name] on 08/30/2019 11:57:12 -------------------------------------------------------------------------------- Lower Extremity Assessment Details Patient Name: Date of Service: [name], [name]. 08/30/2019 10:30 A M Medical Record Number: [id number] Patient Account Number: [id number] Date of Birth/Sex: Treating RN: 07/24/1965 (54 y.o. [name]) [name] Primary Care Jaydyn Menon: Cheryl IlesKING, [name] Other Clinician: Referring Kris Burd: Treating Kali Ambler/Extender: Cheryl Jackobson, Cheryl KING, [name]: 0 Edema Assessment Assessed: [Left: No] [Right: No] E[Left: dema] [Right: :] Calf Left: Right: Point of Measurement: cm From Medial Instep 38 cm cm Ankle Left: Right: Point of Measurement: cm From Medial Instep 22.5 cm cm Vascular Assessment Pulses: Dorsalis Pedis Palpable: [Left:No] Blood Pressure: Brachial: [Left:151] Ankle: [Left:Dorsalis Pedis: 160 1.06] Electronic Signature(s) Signed: 08/31/2019 5:56:33 PM Wolf: [name] Entered Wolf: [name] on 08/30/2019 11:06:32 -------------------------------------------------------------------------------- Multi Wound Chart Details Patient Name: Date of Service: Cheryl Cheryl ArTIN, [name]. 08/30/2019 10:30 A M Medical Record Number: [id number] Patient Account Number: [id number] Date of Birth/Sex: Treating RN: 10/18/1965 (54 y.o. [name]) [name] Primary Care [name]: Cheryl IlesKING, [name] Other Clinician: Referring [name]: Treating [name]: Cheryl Jackobson, Cheryl KING, [name]: 0 Vital Signs Height(in): 67 Pulse(bpm): 74 Weight(lbs): 200 Blood Pressure(mmHg):  151/73 Body Mass Index(BMI): 31 Temperature([name]): 98 Respiratory Rate(breaths/min): 18 Photos: [1:No Photos Left, Proximal, Plantar Foot] [2:No Photos Left, Distal, Plantar Foot] [3:No Photos Left Metatarsal head first] Wound Location: [1:Blister] [2:Blister] [3:Blister] Wounding Event: [1:Diabetic Wound/Ulcer of the Lower] [2:Diabetic Wound/Ulcer of the Lower] [3:Diabetic Wound/Ulcer of the Lower] Primary Etiology: [1:Extremity Cataracts, Anemia, Congestive Heart] [2:Extremity Cataracts, Anemia, Congestive Heart] [3:Extremity Cataracts, Anemia, Congestive Heart] Comorbid History: [1:Failure, Hypertension, Type II Diabetes, Osteomyelitis, Neuropathy 08/02/2019] [2:Failure, Hypertension, Type II Diabetes, Osteomyelitis, Neuropathy 08/02/2019] [3:Failure, Hypertension, Type II Diabetes, Osteomyelitis, Neuropathy  08/02/2019] Date Acquired: [1:0] [2:0] [3:0] Weeks of Wolf: [1:Open] [2:Open] [3:Open] Wound Status: [1:1.6x3.5x2.4] [2:1.2x2.5x1] [3:1.2x0.8x0.7] Measurements Wolf x W x D (cm) [1:4.398] [2:2.356] [3:0.754] A (cm) : rea [1:10.556] [2:2.356] [3:0.528] Volume (cm) : [1:0.00%] [2:0.00%] [3:N/A] % Reduction in A rea: [1:0.00%] [2:0.00%] [3:N/A] % Reduction in Volume: [1:Grade 2] [2:Grade 2] [3:Grade 2] Classification: [1:Medium] [2:Medium] [3:Small] Exudate A mount: [1:Purulent] [2:Purulent] [3:N/A] Exudate Type: [1:yellow, brown, green] [2:yellow, brown, green] [3:N/A] Exudate Color: [1:Well defined, not attached] [2:Well defined, not attached] [3:Well defined, not attached] Wound Margin: [1:Small (1-33%)] [2:Small (1-33%)] [3:Medium (34-66%)] Granulation A mount: [1:Pink] [2:Pink] [3:Pink] Granulation Quality: [1:Large (67-100%)] [2:Large (67-100%)] [3:Medium (34-66%)] Necrotic A mount: [1:Eschar, Adherent Slough] [2:Adherent Slough] [3:Adherent Slough] Necrotic Tissue: [1:Fat Layer (Subcutaneous Tissue): Yes Fat Layer (Subcutaneous Tissue): Yes Fat Layer (Subcutaneous Tissue):  Yes] Exposed Structures: [1:Fascia: No Tendon: No Muscle: No Joint: No Bone: No None] [2:Fascia: No Tendon: No Muscle: No Joint: No Bone: No None] [3:Fascia: No Tendon: No Muscle: No Joint: No Bone: No None] Epithelialization: [1:Debridement - Excisional] [2:Debridement - Excisional] [3:N/A] Debridement: Pre-procedure Verification/Time Out 11:25 [2:11:25] [3:N/A] Taken: [1:Subcutaneous, Slough] [2:Subcutaneous, Slough] [3:N/A] Tissue Debrided: [1:Skin/Subcutaneous Tissue] [2:Skin/Subcutaneous Tissue] [3:N/A] Level: [1:5.6] [2:3] [3:N/A] Debridement A (sq cm): [1:rea Blade, Forceps] [2:Blade, Forceps] [3:N/A] Instrument: [1:Swab] [2:None] [3:N/A] Specimen: [1:1] [2:N/A] [3:N/A] Number of Specimens Taken: [1:Minimum] [2:Minimum] [3:N/A] Bleeding: [1:Pressure] [2:Pressure] [3:N/A] Hemostasis A chieved: [1:0] [2:0] [3:N/A] Procedural Pain: [1:0] [2:0] [3:N/A] Post Procedural Pain: [1:Procedure was tolerated well] [2:Procedure was tolerated well] [3:N/A] Debridement Wolf Response: [1:1.6x3.5x2.4] [2:1.2x2.5x1] [3:N/A] Post Debridement Measurements Wolf x W x D (cm) [1:10.556] [2:2.356] [3:N/A] Post Debridement Volume: (cm) [1:Debridement] [2:Debridement] [3:N/A] Wolf Notes Electronic Signature(s) Signed: 08/30/2019 4:47:33 PM Wolf:  Cheryl Abts RN, Wolf Signed: 08/30/2019 6:08:30 PM Wolf: Baltazar Najjar MD Entered Wolf: Baltazar Najjar on 08/30/2019 11:57:03 -------------------------------------------------------------------------------- Multi-Disciplinary Care Plan Details Patient Name: Date of Service: [name], [name] 08/30/2019 10:30 A M Medical Record Number: [id number] Patient Account Number: [id number] Date of Birth/Sex: Treating RN: 1965/01/31 (54 y.o. Cheryl Link Primary Care Khristi Schiller: [name] Other Clinician: Referring Tyronda Vizcarrondo: Treating Angelys Yetman/Extender: [name], Hope Budds Weeks in Wolf: 0 Active Inactive Abuse / Safety / Falls / Self  Care Management Nursing Diagnoses: Potential for falls Potential for injury related to falls Goals: Patient will not experience any injury related to falls Date Initiated: 08/30/2019 Target Resolution Date: 09/28/2019 Goal Status: Active Patient/caregiver will verbalize/demonstrate measures taken to prevent injury and/or falls Date Initiated: 08/30/2019 Target Resolution Date: 09/28/2019 Goal Status: Active Interventions: Assess Activities of Daily Living upon admission and as needed Assess fall risk on admission and as needed Assess: immobility, friction, shearing, incontinence upon admission and as needed Assess impairment of mobility on admission and as needed per policy Assess personal safety and home safety (as indicated) on admission and as needed Assess self care needs on admission and as needed Provide education on fall prevention Provide education on personal and home safety Notes: Nutrition Nursing Diagnoses: Impaired glucose control: actual or potential Potential for alteratiion in Nutrition/Potential for imbalanced nutrition Goals: Patient/caregiver agrees to and verbalizes understanding of need to use nutritional supplements and/or vitamins as prescribed Date Initiated: 08/30/2019 Target Resolution Date: 09/28/2019 Goal Status: Active Patient/caregiver will maintain therapeutic glucose control Date Initiated: 08/30/2019 Target Resolution Date: 09/28/2019 Goal Status: Active Interventions: Assess HgA1c results as ordered upon admission and as needed Assess patient nutrition upon admission and as needed per policy Provide education on elevated blood sugars and impact on wound healing Provide education on nutrition Notes: Wound/Skin Impairment Nursing Diagnoses: Impaired tissue integrity Knowledge deficit related to ulceration/compromised skin integrity Goals: Patient/caregiver will verbalize understanding of skin care regimen Date Initiated: 08/30/2019 Target  Resolution Date: 09/28/2019 Goal Status: Active Interventions: Assess patient/caregiver ability to obtain necessary supplies Assess patient/caregiver ability to perform ulcer/skin care regimen upon admission and as needed Assess ulceration(s) every visit Provide education on ulcer and skin care Notes: Electronic Signature(s) Signed: 08/30/2019 4:47:33 PM Wolf: Cheryl Abts RN, Wolf Entered Wolf: Cheryl Abts on 08/30/2019 11:56:16 -------------------------------------------------------------------------------- Pain Assessment Details Patient Name: Date of Service: [name]. 08/30/2019 10:30 A M Medical Record Number: [id number] Patient Account Number: [id number] Date of Birth/Sex: Treating RN: 06-Feb-1965 (54 y.o. [name] Primary Care Valma Rotenberg: [name] Other Clinician: Referring Ivori Storr: Treating Mekhi Sonn/Extender: [name], Hope Budds Weeks in Wolf: 0 Active Problems Location of Pain Severity and Description of Pain Patient Has Paino No Site Locations Pain Management and Medication Current Pain Management: Electronic Signature(s) Signed: 08/31/2019 5:56:33 PM Wolf: [name]: [name] on 08/30/2019 11:13:12 -------------------------------------------------------------------------------- Patient/Caregiver Education Details Patient Name: Date of Service: [name] 8/26/2021andnbsp10:30 A M Medical Record Number: [id number] Patient Account Number: [id number] Date of Birth/Gender: Treating RN: Nov 20, 1965 (54 y.o. Cheryl Link Primary Care Physician: [name] Other Clinician: Referring Physician: Treating Physician/Extender: Corliss Blacker in Wolf: 0 Education Assessment Education Provided To: Patient Education Topics Provided Elevated Blood Sugar/ Impact on Healing: Methods: Explain/Verbal Responses: State content correctly Nutrition: Methods:  Explain/Verbal Responses: State content correctly Safety: Methods: Explain/Verbal Responses: State content correctly Wound/Skin Impairment: Methods: Explain/Verbal Responses: State content correctly Electronic Signature(s) Signed: 08/30/2019 4:47:33 PM Wolf:  Cheryl Abts RN, Wolf Entered Wolf: Cheryl Abts on 08/30/2019 11:56:35 -------------------------------------------------------------------------------- Wound Assessment Details Patient Name: Date of Service: [name], [name] 08/30/2019 10:30 A M Medical Record Number: [id number] Patient Account Number: [id number] Date of Birth/Sex: Treating RN: 1965-04-27 (54 y.o. [name] Primary Care Jaymeson Mengel: [name] Other Clinician: Referring Broadus Costilla: Treating Saburo Luger/Extender: [name], [name]: 0 Wound Status Wound Number: 1 Primary Diabetic Wound/Ulcer of the Lower Extremity Etiology: Wound Location: Left, Proximal, Plantar Foot Wound Open Wounding Event: Blister Status: Date Acquired: 08/02/2019 Comorbid Cataracts, Anemia, Congestive Heart Failure, Hypertension, Type Weeks Of Wolf: 0 History: II Diabetes, Osteomyelitis, Neuropathy Clustered Wound: No Wound Measurements Length: (cm) 1.6 Width: (cm) 3.5 Depth: (cm) 2.4 Area: (cm) 4.398 Volume: (cm) 10.556 % Reduction in Area: 0% % Reduction in Volume: 0% Epithelialization: None Tunneling: No Undermining: No Wound Description Classification: Grade 2 Wound Margin: Well defined, not attached Exudate Amount: Medium Exudate Type: Purulent Exudate Color: yellow, brown, green Foul Odor After Cleansing: No Slough/Fibrino Yes Wound Bed Granulation Amount: Small (1-33%) Exposed Structure Granulation Quality: Pink Fascia Exposed: No Necrotic Amount: Large (67-100%) Fat Layer (Subcutaneous Tissue) Exposed: Yes Necrotic Quality: Eschar, Adherent Slough Tendon Exposed: No Muscle Exposed: No Joint Exposed: No Bone  Exposed: No Electronic Signature(s) Signed: 08/31/2019 5:56:33 PM Wolf: [name]: [name] on 08/30/2019 11:12:56 -------------------------------------------------------------------------------- Wound Assessment Details Patient Name: Date of Service: [name], [name] 08/30/2019 10:30 A M Medical Record Number: [id number] Patient Account Number: [id number] Date of Birth/Sex: Treating RN: 1965-04-25 (54 y.o. [name] Primary Care Gene Colee: [name] Other Clinician: Referring Gaylia Kassel: Treating Nilan Iddings/Extender: [name], [name]: 0 Wound Status Wound Number: 2 Primary Diabetic Wound/Ulcer of the Lower Extremity Etiology: Wound Location: Left, Distal, Plantar Foot Wound Open Wounding Event: Blister Status: Date Acquired: 08/02/2019 Comorbid Cataracts, Anemia, Congestive Heart Failure, Hypertension, Type Weeks Of Wolf: 0 History: II Diabetes, Osteomyelitis, Neuropathy Clustered Wound: No Wound Measurements Length: (cm) 1.2 Width: (cm) 2.5 Depth: (cm) 1 Area: (cm) 2.356 Volume: (cm) 2.356 % Reduction in Area: 0% % Reduction in Volume: 0% Epithelialization: None Tunneling: No Undermining: No Wound Description Classification: Grade 2 Wound Margin: Well defined, not attached Exudate Amount: Medium Exudate Type: Purulent Exudate Color: yellow, brown, green Foul Odor After Cleansing: No Slough/Fibrino Yes Wound Bed Granulation Amount: Small (1-33%) Exposed Structure Granulation Quality: Pink Fascia Exposed: No Necrotic Amount: Large (67-100%) Fat Layer (Subcutaneous Tissue) Exposed: Yes Necrotic Quality: Adherent Slough Tendon Exposed: No Muscle Exposed: No Joint Exposed: No Bone Exposed: No Electronic Signature(s) Signed: 08/31/2019 5:56:33 PM Wolf: [name]: [name] on 08/30/2019  11:12:43 -------------------------------------------------------------------------------- Wound Assessment Details Patient Name: Date of Service: [name], [name] 08/30/2019 10:30 A M Medical Record Number: [id number] Patient Account Number: [id number] Date of Birth/Sex: Treating RN: Dec 22, 1965 (54 y.o. [name] Primary Care Kaeden Depaz: [name] Other Clinician: Referring Eulalie Speights: Treating Kaetlyn Noa/Extender: [name], [name]: 0 Wound Status Wound Number: 3 Primary Diabetic Wound/Ulcer of the Lower Extremity Etiology: Wound Location: Left Metatarsal head first Wound Open Wounding Event: Blister Status: Date Acquired: 08/02/2019 Comorbid Cataracts, Anemia, Congestive Heart Failure, Hypertension, Type Weeks Of Wolf: 0 History: II Diabetes, Osteomyelitis, Neuropathy Clustered Wound: No Wound Measurements Length: (cm) 1.2 Width: (cm) 0.8 Depth: (cm) 0.7 Area: (cm) 0.754 Volume: (cm) 0.528 % Reduction in Area: % Reduction in Volume: Epithelialization: None Tunneling: No Undermining: No Wound Description Classification: Grade  2 Wound Margin: Well defined, not attached Exudate Amount: Small Wound Bed Granulation Amount: Medium (34-66%) Granulation Quality: Pink Necrotic Amount: Medium (34-66%) Necrotic Quality: Adherent Slough Foul Odor After Cleansing: No Slough/Fibrino Yes Exposed Structure Fascia Exposed: No Fat Layer (Subcutaneous Tissue) Exposed: Yes Tendon Exposed: No Muscle Exposed: No Joint Exposed: No Bone Exposed: No Electronic Signature(s) Signed: 08/31/2019 5:56:33 PM Wolf: [name]: [name] on 08/30/2019 11:10:53 -------------------------------------------------------------------------------- Vitals Details Patient Name: Date of Service: [name]. 08/30/2019 10:30 A M Medical Record Number: [id number] Patient Account Number: [id number] Date of  Birth/Sex: Treating RN: [birth year] (54 y.o. [name] Primary Care Shaquasha Gerstel: [name] Other Clinician: Referring Aniya Jolicoeur: Treating Cary Wilford/Extender: [name], [name]: 0 Vital Signs Time Taken: 10:30 Temperature ([name]): 98 Height (in): 67 Pulse (bpm): 74 Source: Stated Respiratory Rate (breaths/min): 18 Weight (lbs): 200 Blood Pressure (mmHg): 151/73 Source: Stated Reference Range: 80 - 120 mg / dl Body Mass Index (BMI): 31.3 Notes patient did not check CBG this morning Electronic Signature(s) Signed: 08/31/2019 5:56:33 PM Wolf: [name]: [name] on 08/30/2019 10:55:45

## 2019-09-01 NOTE — Progress Notes (Signed)
[name] ([id number]) Visit Report for 08/30/2019 Abuse/Suicide Risk Screen Details Patient Name: Date of Service: [place] [name], [name] 08/30/2019 10:30 A M Medical Record Number: [id number] Patient Account Number: [id number] Date of Birth/Sex: Treating RN: 1965-09-12 (54 y.o. [name] Primary Care Jamilla Galli: Heywood Iles Other Clinician: Referring Kathee Tumlin: Treating Shyniece Scripter/Extender: Grayland Jack, CRYSTA L Weeks in Treatment: 0 Abuse/Suicide Risk Screen Items Answer ABUSE RISK SCREEN: Has anyone close to you tried to hurt or harm you recentlyo No Do you feel uncomfortable with anyone in your familyo No Has anyone forced you do things that you didnt want to doo No Electronic Signature(s) Signed: 08/31/2019 5:56:33 PM By: [name] Entered By: [name] on 08/30/2019 10:59:41 -------------------------------------------------------------------------------- Activities of Daily Living Details Patient Name: Date of Service: [name] 08/30/2019 10:30 A M Medical Record Number: [id number] Patient Account Number: [id number] Date of Birth/Sex: Treating RN: [birth year] (54 y.o. [name] Primary Care Bastian Andreoli: Heywood Iles Other Clinician: Referring Ayansh Feutz: Treating Deja Pisarski/Extender: Grayland Jack, CRYSTA L Weeks in Treatment: 0 Activities of Daily Living Items Answer Activities of Daily Living (Please select one for each item) Drive Automobile Not Able T Medications ake Completely Able Use T elephone Completely Able Care for Appearance Completely Able Use T oilet Completely Able Bath / Shower Completely Able Dress Self Completely Able Feed Self Completely Able Walk Completely Able Get In / Out Bed Completely Able Housework Need Assistance Prepare Meals Need Assistance Handle Money Need Assistance Shop for Self Need Assistance Electronic Signature(s) Signed: 08/31/2019 5:56:33 PM By: [name] Entered By: [name] on 08/30/2019 11:00:33 -------------------------------------------------------------------------------- Education Screening Details Patient Name: Date of Service: Cheryl [name] 08/30/2019 10:30 A M Medical Record Number: [id number] Patient Account Number: [id number] Date of Birth/Sex: Treating RN: December 12, 1965 (54 y.o. [name] Primary Care Ruqayyah Lute: Heywood Iles Other Clinician: Referring Roczen Waymire: Treating Thorvald Orsino/Extender: Marylene Land Weeks in Treatment: 0 Primary Learner Assessed: Patient Learning Preferences/Education Level/Primary Language Learning Preference: Explanation Preferred Language: English Cognitive Barrier Language Barrier: No Translator Needed: No Memory Deficit: No Emotional Barrier: No Cultural/Religious Beliefs Affecting Medical Care: No Physical Barrier Impaired Vision: Yes Impaired Hearing: No Decreased Hand dexterity: No Knowledge/Comprehension Knowledge Level: Medium Comprehension Level: Medium Ability to understand written instructions: Medium Ability to understand verbal instructions: Medium Motivation Anxiety Level: Calm Cooperation: Cooperative Education Importance: Acknowledges Need Interest in Health Problems: Asks Questions Perception: Coherent Willingness to Engage in Self-Management High Activities: Readiness to Engage in Self-Management High Activities: Electronic Signature(s) Signed: 08/31/2019 5:56:33 PM By: [name] Entered By: [name] on 08/30/2019 11:00:53 -------------------------------------------------------------------------------- Fall Risk Assessment Details Patient Name: Date of Service: Cheryl [name] 08/30/2019 10:30 A M Medical Record Number: [id number] Patient Account Number: [id number] Date of Birth/Sex: Treating RN: April 24, 1965 (54 y.o. [name] Primary Care Shayli Altemose: Heywood Iles Other  Clinician: Referring Tiffancy Moger: Treating Cionna Collantes/Extender: Grayland Jack, Hope Budds Weeks in Treatment: 0 Fall Risk Assessment Items Have you had 2 or more falls in the last 12 monthso 0 No Have you had any fall that resulted in injury in the last 12 monthso 0 No FALLS RISK SCREEN History of falling - immediate or within 3 months 0 No Secondary diagnosis (Do you have 2 or more medical diagnoseso) 15 Yes Ambulatory aid None/bed rest/wheelchair/nurse 0 Yes Crutches/cane/walker 0 No Furniture 0 No Intravenous therapy Access/Saline/Heparin Lock 0 No Gait/Transferring Normal/ bed rest/ wheelchair 0 Yes Weak (short steps with or without  shuffle, stooped but able to lift head while walking, may seek 0 No support from furniture) Impaired (short steps with shuffle, may have difficulty arising from chair, head down, impaired 0 No balance) Mental Status Oriented to own ability 0 Yes Electronic Signature(s) Signed: 08/31/2019 5:56:33 PM By: [name] Entered By: [name] on 08/30/2019 11:01:23 -------------------------------------------------------------------------------- Foot Assessment Details Patient Name: Date of Service: Cheryl [name] 08/30/2019 10:30 A M Medical Record Number: [id number] Patient Account Number: [id number] Date of Birth/Sex: Treating RN: [birth year] (54 y.o. [name] Primary Care Jeffre Enriques: Heywood Iles Other Clinician: Referring Vila Dory: Treating Kalel Harty/Extender: Grayland Jack, CRYSTA L Weeks in Treatment: 0 Foot Assessment Items Site Locations + = Sensation present, - = Sensation absent, C = Callus, U = Ulcer R = Redness, W = Warmth, M = Maceration, PU = Pre-ulcerative lesion F = Fissure, S = Swelling, D = Dryness Assessment Right: Left: Other Deformity: No No Prior Foot Ulcer: No No Prior Amputation: Yes No Charcot Joint: No No Ambulatory Status: Non-ambulatory Assistance Device: Wheelchair Gait:  Surveyor, mining) Signed: 08/31/2019 5:56:33 PM By: [name] Entered By: [name] on 08/30/2019 11:02:33 -------------------------------------------------------------------------------- Nutrition Risk Screening Details Patient Name: Date of Service: [name] 08/30/2019 10:30 A M Medical Record Number: [id number] Patient Account Number: [id number] Date of Birth/Sex: Treating RN: 1965/08/01 (54 y.o. [name] Primary Care Lucero Ide: Heywood Iles Other Clinician: Referring Reve Crocket: Treating Yola Paradiso/Extender: Grayland Jack, CRYSTA L Weeks in Treatment: 0 Height (in): 67 Weight (lbs): 200 Body Mass Index (BMI): 31.3 Nutrition Risk Screening Items Score Screening NUTRITION RISK SCREEN: I have an illness or condition that made me change the kind and/or amount of food I eat 0 No I eat fewer than two meals per day 0 No I eat few fruits and vegetables, or milk products 0 No I have three or more drinks of beer, liquor or wine almost every day 0 No I have tooth or mouth problems that make it hard for me to eat 0 No I don't always have enough money to buy the food I need 0 No I eat alone most of the time 0 No I take three or more different prescribed or over-the-counter drugs a day 1 Yes Without wanting to, I have lost or gained 10 pounds in the last six months 0 No I am not always physically able to shop, cook and/or feed myself 0 No Nutrition Protocols Good Risk Protocol 0 No interventions needed Moderate Risk Protocol High Risk Proctocol Risk Level: Good Risk Score: 1 Electronic Signature(s) Signed: 08/31/2019 5:56:33 PM By: [name] Entered By: [name] on 08/30/2019 11:01:39

## 2019-09-01 NOTE — Progress Notes (Signed)
[name], [name] ([id number]) Visit Report for 08/30/2019 Chief Complaint Document Details Patient Name: Date of Service: MA GERIANNE, SIMONET 08/30/2019 10:30 A M Medical Record Number: [id number] Patient Account Number: [id number] Date of Birth/Sex: Treating RN: Dec 05, 1965 (54 y.o. Wynelle Link Primary Care Provider: Heywood Iles Other Clinician: Referring Provider: Treating Provider/Extender: Grayland Jack, Hope Budds Weeks in Treatment: 0 Information Obtained from: Patient Chief Complaint 08/30/2019; patient is here for review of wounds on her left plantar foot Electronic Signature(s) Signed: 08/30/2019 6:08:30 PM By: Baltazar Najjar MD Entered By: Baltazar Najjar on 08/30/2019 11:57:20 -------------------------------------------------------------------------------- Debridement Details Patient Name: Date of Service: [name] 08/30/2019 10:30 A M Medical Record Number: [id number] Patient Account Number: [id number] Date of Birth/Sex: Treating RN: 1965/01/13 (54 y.o. Wynelle Link Primary Care Provider: Heywood Iles Other Clinician: Referring Provider: Treating Provider/Extender: Grayland Jack, Hope Budds Weeks in Treatment: 0 Debridement Performed for Assessment: Wound #1 Left,Proximal,Plantar Foot Performed By: Physician Maxwell Caul., MD Debridement Type: Debridement Severity of Tissue Pre Debridement: Fat layer exposed Level of Consciousness (Pre-procedure): Awake and Alert Pre-procedure Verification/Time Out Yes - 11:25 Taken: Start Time: 11:25 T Area Debrided (L x W): otal 1.6 (cm) x 3.5 (cm) = 5.6 (cm) Tissue and other material debrided: Viable, Non-Viable, Slough, Subcutaneous, Slough Level: Skin/Subcutaneous Tissue Debridement Description: Excisional Instrument: Blade, Forceps Specimen: Swab, Number of Specimens T aken: 1 Bleeding: Minimum Hemostasis Achieved: Pressure End Time: 11:27 Procedural Pain: 0 Post Procedural  Pain: 0 Response to Treatment: Procedure was tolerated well Level of Consciousness (Post- Awake and Alert procedure): Post Debridement Measurements of Total Wound Length: (cm) 1.6 Width: (cm) 3.5 Depth: (cm) 2.4 Volume: (cm) 10.556 Character of Wound/Ulcer Post Debridement: Requires Further Debridement Severity of Tissue Post Debridement: Fat layer exposed Post Procedure Diagnosis Same as Pre-procedure Electronic Signature(s) Signed: 08/30/2019 4:47:33 PM By: Zandra Abts RN, [name] Signed: 08/30/2019 6:08:30 PM By: Baltazar Najjar MD Entered By: Zandra Abts on 08/30/2019 11:27:11 -------------------------------------------------------------------------------- Debridement Details Patient Name: Date of Service: [name] 08/30/2019 10:30 A M Medical Record Number: [id number] Patient Account Number: [id number] Date of Birth/Sex: Treating RN: [birth year] (54 y.o. Wynelle Link Primary Care Provider: Heywood Iles Other Clinician: Referring Provider: Treating Provider/Extender: Grayland Jack, Hope Budds Weeks in Treatment: 0 Debridement Performed for Assessment: Wound #2 Left,Distal,Plantar Foot Performed By: Physician Maxwell Caul., MD Debridement Type: Debridement Severity of Tissue Pre Debridement: Fat layer exposed Level of Consciousness (Pre-procedure): Awake and Alert Pre-procedure Verification/Time Out Yes - 11:25 Taken: Start Time: 11:25 T Area Debrided (L x W): otal 1.2 (cm) x 2.5 (cm) = 3 (cm) Tissue and other material debrided: Viable, Non-Viable, Slough, Subcutaneous, Slough Level: Skin/Subcutaneous Tissue Debridement Description: Excisional Instrument: Blade, Forceps Bleeding: Minimum Hemostasis Achieved: Pressure End Time: 11:27 Procedural Pain: 0 Post Procedural Pain: 0 Response to Treatment: Procedure was tolerated well Level of Consciousness (Post- Awake and Alert procedure): Post Debridement Measurements of Total  Wound Length: (cm) 1.2 Width: (cm) 2.5 Depth: (cm) 1 Volume: (cm) 2.356 Character of Wound/Ulcer Post Debridement: Requires Further Debridement Severity of Tissue Post Debridement: Fat layer exposed Post Procedure Diagnosis Same as Pre-procedure Electronic Signature(s) Signed: 08/30/2019 4:47:33 PM By: Zandra Abts RN, [name] Signed: 08/30/2019 6:08:30 PM By: Baltazar Najjar MD Entered By: Zandra Abts on 08/30/2019 11:28:24 -------------------------------------------------------------------------------- HPI Details Patient Name: Date of Service: MA Wolf, Cheryl 08/30/2019 10:30 A M Medical Record Number: [id number] Patient Account Number: [id number] Date of Birth/Sex: Treating RN: [birth year] (53  y.o. Wynelle Link Primary Care Provider: Heywood Iles Other Clinician: Referring Provider: Treating Provider/Extender: Grayland Jack, Hope Budds Weeks in Treatment: 0 History of Present Illness HPI Description: ADMISSION 08/30/2019 This is a 54 year old woman with type 2 diabetes and peripheral neuropathy. She developed a blister on the plantar left foot on 7/27. She was seen in the emergency room treated. This did not get any better and she required admission to the hospital from 08/02/2019 through 08/09/2019. She was felt to have cellulitis of the left foot. She was treated with IV medications/antibiotics discharged on Augmentin however she could not tolerate this and she was changed to doxycycline for 5 days she has completed this. She has advanced home care they are apparently putting Xeroform over the wound. When she left the hospital she was supposed to be putting Santyl over the black areas on the left plantar foot covered with Xeroform. X-rays done in the hospital did not show evidence of osteomyelitis only a superficial plantar ulceration. She had blood cultures but I do not see a wound culture. The patient has a past medical history of a right below-knee amputation by  Dr. [name] in 2012 presumably for infection in this area although I am not certain. As noted she is a type II diabetic on oral agents with a recent hemoglobin A1c of 5.5. Other past medical history includes ITP, peripheral edema, chronic kidney disease stage III, hypertension, right below-knee amputation, diastolic heart failure. She tells me she is wheelchair-bound and is nonweightbearing although she does push the wheelchair along with the left foot. She is only using hospital sock on the left foot. ABI in our clinic was 1.06 on the left Electronic Signature(s) Signed: 08/30/2019 6:08:30 PM By: Baltazar Najjar MD Entered By: Baltazar Najjar on 08/30/2019 12:01:04 -------------------------------------------------------------------------------- Physical Exam Details Patient Name: Date of Service: MA JENEEN, DOUTT 08/30/2019 10:30 A M Medical Record Number: [id number] Patient Account Number: [id number] Date of Birth/Sex: Treating RN: 03-Sep-1965 (54 y.o. Wynelle Link Primary Care Provider: Heywood Iles Other Clinician: Referring Provider: Treating Provider/Extender: Grayland Jack, Hope Budds Weeks in Treatment: 0 Constitutional Patient is hypertensive.. Pulse regular and within target range for patient.Marland Kitchen Respirations regular, non-labored and within target range.. Temperature is normal and within the target range for the patient.Marland Kitchen Appears in no distress. Eyes Conjunctivae clear. No discharge.no icterus. Cardiovascular Popliteal pulses palpable on the left. Pedal pulses are palpable on the left. Integumentary (Hair, Skin) She has discoloration in the left leg darker. This looks like venous change/hemosiderin/stasis dermatitis. This is not palpably warm or tender.Marland Kitchen Psychiatric appears at normal baseline. Notes Wound exam Plantar left foot. She has a superficial but full-thickness wound over the left first metatarsal head this does not look too bad. The major areas here are 2  wounds on the plantar midfoot. One distally and 1 more proximally although they are in close position. Completely necrotic tissue in the base of both of these areas. I used pickups and a scalpel to remove as much of this is possible. These wounds I think actually connect. There is substantial tunneling towards the lateral part of the foot from the more proximal wound. After debridement of the more distal wound I did a swab culture. There is no palpable bone but I do not think we are far off it. Electronic Signature(s) Signed: 08/30/2019 6:08:30 PM By: Baltazar Najjar MD Entered By: Baltazar Najjar on 08/30/2019 12:03:19 -------------------------------------------------------------------------------- Physician Orders Details Patient Name: Date of Service: [name], Azaryah L.  08/30/2019 10:30 A M Medical Record Number: [id number] Patient Account Number: [id number] Date of Birth/Sex: Treating RN: 10/04/1965 (54 y.o. Wynelle LinkF) Lynch, Shatara Primary Care Provider: Heywood IlesKING, CRYSTA L Other Clinician: Referring Provider: Treating Provider/Extender: Grayland Jackobson, Ikechukwu Cerny KING, Hope BuddsRYSTA L Weeks in Treatment: 0 Verbal / Phone Orders: No Diagnosis Coding Follow-up Appointments Return Appointment in 1 week. Dressing Change Frequency Wound #1 Left,Proximal,Plantar Foot Change dressing three times week. Wound #2 Left,Distal,Plantar Foot Change dressing three times week. Wound #3 Left Metatarsal head first Change dressing three times week. Wound Cleansing Clean wound with Wound Cleanser - or normal saline - all wounds Primary Wound Dressing Wound #1 Left,Proximal,Plantar Foot lginate with Silver - lightly pack into undermining/tunneling Calcium A Wound #2 Left,Distal,Plantar Foot lginate with Silver - lightly pack into undermining/tunneling Calcium A Wound #3 Left Metatarsal head first Calcium Alginate with Silver Secondary Dressing Wound #1 Left,Proximal,Plantar Foot Kerlix/Rolled Gauze Dry Gauze ABD  pad Wound #2 Left,Distal,Plantar Foot Kerlix/Rolled Gauze Dry Gauze ABD pad Wound #3 Left Metatarsal head first Kerlix/Rolled Gauze Dry Gauze Off-Loading Open toe surgical shoe to: - left foot Home Health Continue Home Health skilled nursing for wound care. - [facility] naerobe culture (MICRO) - Left proximal plantar foot Bacteria identified in Unspecified specimen by A LOINC Code: 635-3 Convenience Name: Anerobic culture Radiology MRI, left foot with and without contrast - **URGENT** - Multiple non healing, necrotic ulcers on left plantar foot Electronic Signature(s) Signed: 08/30/2019 4:47:33 PM By: Zandra AbtsLynch, Shatara RN, [name] Signed: 08/30/2019 6:08:30 PM By: Baltazar Najjarobson, Florice Hindle MD Entered By: Zandra AbtsLynch, Shatara on 08/30/2019 11:36:54 Prescription 08/30/2019 -------------------------------------------------------------------------------- Daphine DeutscherMARTIN, Atha L. Baltazar Najjarobson, Johnaton Sonneborn MD Patient Name: Provider: 02/06/1965 [phone] Date of Birth: NPI#: [id number] Sex: DEA #: [phone] Phone #: License #: [facility] Patient Address: [address] [place], [place] [address] [address] [place], [place] [address] [phone] Allergies No Known Allergies Provider's Orders MRI, left foot with and without contrast - **URGENT** - Multiple non healing, necrotic ulcers on left plantar foot Hand Signature: Date(s): Electronic Signature(s) Signed: 08/30/2019 4:47:33 PM By: Zandra AbtsLynch, Shatara RN, [name] Signed: 08/30/2019 6:08:30 PM By: Baltazar Najjarobson, Aamilah Augenstein MD Entered By: Zandra AbtsLynch, Shatara on 08/30/2019 11:36:55 -------------------------------------------------------------------------------- Problem List Details Patient Name: Date of Service: MA Cheryl ArRTIN, Cache L. 08/30/2019 10:30 A M Medical Record Number: [id number] Patient Account Number: [id number] Date of Birth/Sex: Treating RN: 03/26/1965 (54 y.o. Wynelle LinkF) Lynch, Shatara Primary Care  Provider: Heywood IlesKING, CRYSTA L Other Clinician: Referring Provider: Treating Provider/Extender: Grayland Jackobson, Timathy Newberry KING, Hope BuddsRYSTA L Weeks in Treatment: 0 Active Problems ICD-10 Encounter Code Description Active Date MDM Diagnosis E11.621 Type 2 diabetes mellitus with foot ulcer 08/30/2019 No Yes L97.523 Non-pressure chronic ulcer of other part of left foot with necrosis of muscle 08/30/2019 No Yes Z89.511 Acquired absence of right leg below knee 08/30/2019 No Yes M86.672 Other chronic osteomyelitis, left ankle and foot 08/30/2019 No Yes Inactive Problems Resolved Problems Electronic Signature(s) Signed: 08/30/2019 6:08:30 PM By: Baltazar Najjarobson, Susie Ehresman MD Entered By: Baltazar Najjarobson, Darvis Croft on 08/30/2019 11:56:54 -------------------------------------------------------------------------------- Progress Note Details Patient Name: Date of Service: MA Cheryl ArTIN, Lashaunta L. 08/30/2019 10:30 A M Medical Record Number: [id number] Patient Account Number: [id number] Date of Birth/Sex: Treating RN: 08/10/1965 (54 y.o. Wynelle LinkF) Lynch, Shatara Primary Care Provider: Heywood IlesKING, CRYSTA L Other Clinician: Referring Provider: Treating Provider/Extender: Grayland Jackobson, [place] Nordquist KING, Hope BuddsRYSTA L Weeks in Treatment: 0 Subjective Chief Complaint Information obtained from Patient 08/30/2019; patient is here for review of wounds on her left plantar foot History of Present Illness (HPI) ADMISSION  08/30/2019 This is a 54 year old woman with type 2 diabetes and peripheral neuropathy. She developed a blister on the plantar left foot on 7/27. She was seen in the emergency room treated. This did not get any better and she required admission to the hospital from 08/02/2019 through 08/09/2019. She was felt to have cellulitis of the left foot. She was treated with IV medications/antibiotics discharged on Augmentin however she could not tolerate this and she was changed to doxycycline for 5 days she has completed this. She has advanced home care they are apparently  putting Xeroform over the wound. When she left the hospital she was supposed to be putting Santyl over the black areas on the left plantar foot covered with Xeroform. X-rays done in the hospital did not show evidence of osteomyelitis only a superficial plantar ulceration. She had blood cultures but I do not see a wound culture. The patient has a past medical history of a right below-knee amputation by Dr. [name] in 2012 presumably for infection in this area although I am not certain. As noted she is a type II diabetic on oral agents with a recent hemoglobin A1c of 5.5. Other past medical history includes ITP, peripheral edema, chronic kidney disease stage III, hypertension, right below-knee amputation, diastolic heart failure. She tells me she is wheelchair-bound and is nonweightbearing although she does push the wheelchair along with the left foot. She is only using hospital sock on the left foot. ABI in our clinic was 1.06 on the left Patient History Information obtained from Patient. Allergies No Known Allergies Family History Cancer - Father, Diabetes - Mother,Siblings,Father,Child, Hypertension - Mother,Father. Social History Never smoker, Marital Status - Widowed, Alcohol Use - Rarely, Drug Use - No History, Caffeine Use - Rarely. Medical History Eyes Patient has history of Cataracts Hematologic/Lymphatic Patient has history of Anemia Cardiovascular Patient has history of Congestive Heart Failure, Hypertension Endocrine Patient has history of Type II Diabetes Integumentary (Skin) Denies history of History of Burn Musculoskeletal Patient has history of Osteomyelitis Neurologic Patient has history of Neuropathy Patient is treated with Oral Agents. Hospitalization/Surgery History - right transmet amputation. - right BKA. - brain surgery. - c-section. - IandD left plantar ulcer. - tubal ligation. Medical A Surgical History Notes nd Cardiovascular stroke Integumentary  (Skin) 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 Review of Systems (ROS) Constitutional Symptoms (General Health) Denies complaints or symptoms of Fatigue, Fever, Chills, Marked Weight Change. Eyes Complains or has symptoms of Vision Changes. Ear/Nose/Mouth/Throat Denies complaints or symptoms of Chronic sinus problems or rhinitis. Respiratory Denies complaints or symptoms of Chronic or frequent coughs, Shortness of Breath. Cardiovascular Denies complaints or symptoms of Chest pain. Gastrointestinal Denies complaints or symptoms of Frequent diarrhea, Nausea, Vomiting. Genitourinary Denies complaints or symptoms of Frequent urination. Integumentary (Skin) Complains or has symptoms of Wounds - left foot. Psychiatric Denies complaints or symptoms of Claustrophobia, Suicidal. Objective Constitutional Patient is hypertensive.. Pulse regular and within target range for patient.Marland Kitchen Respirations regular, non-labored and within target range.. Temperature is normal and within the target range for the patient.Marland Kitchen Appears in no distress. Vitals Time Taken: 10:30 AM, Height: 67 in, Source: Stated, Weight: 200 lbs, Source: Stated, BMI: 31.3, Temperature: 98 F, Pulse: 74 bpm, Respiratory Rate: 18 breaths/min, Blood Pressure: 151/73 mmHg. General Notes: patient did not check CBG this morning Eyes Conjunctivae clear. No discharge.no icterus. Cardiovascular Popliteal pulses palpable on the left. Pedal pulses are palpable on the left. Psychiatric appears at normal baseline. General Notes: Wound exam ooPlantar left foot. She has  a superficial but full-thickness wound over the left first metatarsal head this does not look too bad. ooThe major areas here are 2 wounds on the plantar midfoot. One distally and 1 more proximally although they are in close position. Completely necrotic tissue in the base of both of these areas. I used pickups and a scalpel to remove as much of this is possible. These wounds I think  actually connect. There is substantial tunneling towards the lateral part of the foot from the more proximal wound. After debridement of the more distal wound I did a swab culture. There is no palpable bone but I do not think we are far off it. Integumentary (Hair, Skin) She has discoloration in the left leg darker. This looks like venous change/hemosiderin/stasis dermatitis. This is not palpably warm or tender.. Wound #1 status is Open. Original cause of wound was Blister. The wound is located on the Left,Proximal,Plantar Foot. The wound measures 1.6cm length x 3.5cm width x 2.4cm depth; 4.398cm^2 area and 10.556cm^3 volume. There is Fat Layer (Subcutaneous Tissue) exposed. There is no tunneling or undermining noted. There is a medium amount of purulent drainage noted. The wound margin is well defined and not attached to the wound base. There is small (1-33%) pink granulation within the wound bed. There is a large (67-100%) amount of necrotic tissue within the wound bed including Eschar and Adherent Slough. Wound #2 status is Open. Original cause of wound was Blister. The wound is located on the Left,Distal,Plantar Foot. The wound measures 1.2cm length x 2.5cm width x 1cm depth; 2.356cm^2 area and 2.356cm^3 volume. There is Fat Layer (Subcutaneous Tissue) exposed. There is no tunneling or undermining noted. There is a medium amount of purulent drainage noted. The wound margin is well defined and not attached to the wound base. There is small (1-33%) pink granulation within the wound bed. There is a large (67-100%) amount of necrotic tissue within the wound bed including Adherent Slough. Wound #3 status is Open. Original cause of wound was Blister. The wound is located on the Left Metatarsal head first. The wound measures 1.2cm length x 0.8cm width x 0.7cm depth; 0.754cm^2 area and 0.528cm^3 volume. There is Fat Layer (Subcutaneous Tissue) exposed. There is no tunneling or undermining noted. There is  a small amount of drainage noted. The wound margin is well defined and not attached to the wound base. There is medium (34-66%) pink granulation within the wound bed. There is a medium (34-66%) amount of necrotic tissue within the wound bed including Adherent Slough. 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 There was a Excisional Skin/Subcutaneous Tissue Debridement with a total area of 5.6 sq cm performed by Maxwell Caul., MD. With the following instrument(s): Blade, and Forceps to remove Viable and Non-Viable tissue/material. Material removed includes Subcutaneous Tissue and Slough and. 1 specimen was taken by a Swab and sent to the lab per facility protocol. A time out was conducted at 11:25, prior to the start of the procedure. A Minimum amount of bleeding was controlled with Pressure. The procedure was tolerated well with a pain level of 0 throughout and a pain level of 0  following the procedure. Post Debridement Measurements: 1.6cm length x 3.5cm width x 2.4cm depth; 10.556cm^3 volume. Character of Wound/Ulcer Post Debridement requires further debridement. Severity of Tissue Post Debridement is: Fat layer exposed. Post procedure Diagnosis Wound #1: Same as Pre-Procedure Wound #2 Pre-procedure diagnosis of Wound #2 is a Diabetic Wound/Ulcer of the Lower Extremity located on the Left,Distal,Plantar Foot .Severity of Tissue Pre Debridement is: Fat layer exposed. There was a Excisional Skin/Subcutaneous Tissue Debridement with a total area of 3 sq cm performed by Maxwell Caul., MD.  With the following instrument(s): Blade, and Forceps to remove Viable and Non-Viable tissue/material. Material removed includes Subcutaneous Tissue and Slough and. No specimens were taken. A time out was conducted at 11:25, prior to the start of the procedure. A Minimum amount of bleeding was controlled with Pressure. The procedure was tolerated well with a pain level of 0 throughout and a pain level of 0 following the procedure. Post Debridement Measurements: 1.2cm length x 2.5cm width x 1cm depth; 2.356cm^3 volume. Character of Wound/Ulcer Post Debridement requires further debridement. Severity of Tissue Post Debridement is: Fat layer exposed. Post procedure Diagnosis Wound #2: Same as Pre-Procedure Plan Follow-up Appointments: Return Appointment in 1 week. Dressing Change Frequency: Wound #1 Left,Proximal,Plantar Foot: Change dressing three times week. Wound #2 Left,Distal,Plantar Foot: Change dressing three times week. Wound #3 Left Metatarsal head first: Change dressing three times week. Wound Cleansing: Clean wound with Wound Cleanser - or normal saline - all wounds Primary Wound Dressing: Wound #1 Left,Proximal,Plantar Foot: Calcium Alginate with Silver - lightly pack into undermining/tunneling Wound #2 Left,Distal,Plantar Foot: Calcium Alginate with Silver - lightly pack into undermining/tunneling Wound #3 Left Metatarsal head first: Calcium Alginate with Silver Secondary Dressing: Wound #1 Left,Proximal,Plantar Foot: Kerlix/Rolled Gauze Dry Gauze ABD pad Wound #2 Left,Distal,Plantar Foot: Kerlix/Rolled Gauze Dry Gauze ABD pad Wound #3 Left Metatarsal head first: Kerlix/Rolled Gauze Dry Gauze Off-Loading: Open toe surgical shoe to: - left foot Home Health: Continue Home Health skilled nursing for wound care. - [facility] ordered were: Anerobic culture - Left proximal plantar foot Radiology ordered were: MRI, left foot with and without contrast - **URGENT**  - Multiple non healing, necrotic ulcers on left plantar foot #1 this patient had more than cellulitis of the left foot I suspect she had soft tissue infection. Currently she has an extensive amount of necrotic material in the base of her wounds which I have attempted to remove I suspect there is more damage than is easily visualized. 2. There was no gross purulence post debridement although I did do a culture of the more major wound distally. 3. I have not added additional antibiotics but will wait for the culture. 4. The possibility of underlying osteomyelitis here is high therefore I will attempt to order an MRI of the foot with contrast with out doing another plain x-ray. 5. She tells me she has an appointment with Dr. Logan Bores of podiatry on Monday.I told her that I think this is actually a good appointment to keep. I cannot rule out that she will require surgical debridement in the ORon the plantar part of her foot. I will send Dr. Logan Bores a heads up note through epic. 6. I will see her next week. We gave her a surgical shoe to offload and told her to be careful when she is pushing her wheelchair with her foot. She sees Dr. Logan Bores on Monday and I think that is a proper appointment at this point given the likelihood she is going to  need foot surgery/operative debridement 7. Fortunately by clinical exam and ABIs in our clinic I do not believe there is an arterial issue I spent 45 minutes in the review of this patient's past medical history, face-to-face evaluation and creation of this record Electronic Signature(s) Signed: 08/30/2019 6:08:30 PM By: Baltazar Najjar MD Entered By: Baltazar Najjar on 08/30/2019 12:08:11 -------------------------------------------------------------------------------- HxROS Details Patient Name: Date of Service: [name] 08/30/2019 10:30 A M Medical Record Number: [id number] Patient Account Number: [id number] Date of Birth/Sex: Treating RN: 1965-02-04 (54 y.o.  [name] Primary Care Provider: Heywood Iles Other Clinician: Referring Provider: Treating Provider/Extender: Grayland Jack, Hope Budds Weeks in Treatment: 0 Information Obtained From Patient Constitutional Symptoms (General Health) Complaints and Symptoms: Negative for: Fatigue; Fever; Chills; Marked Weight Change Eyes Complaints and Symptoms: Positive for: Vision Changes Medical History: Positive for: Cataracts Ear/Nose/Mouth/Throat Complaints and Symptoms: Negative for: Chronic sinus problems or rhinitis Respiratory Complaints and Symptoms: Negative for: Chronic or frequent coughs; Shortness of Breath Cardiovascular Complaints and Symptoms: Negative for: Chest pain Medical History: Positive for: Congestive Heart Failure; Hypertension Past Medical History Notes: stroke Gastrointestinal Complaints and Symptoms: Negative for: Frequent diarrhea; Nausea; Vomiting Genitourinary Complaints and Symptoms: Negative for: Frequent urination Integumentary (Skin) Complaints and Symptoms: Positive for: Wounds - left foot Medical History: Negative for: History of Burn Past Medical History Notes: 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 Psychiatric Complaints and Symptoms: Negative for: Claustrophobia; Suicidal Hematologic/Lymphatic Medical History: Positive for: Anemia Endocrine Medical History: Positive for: Type II Diabetes Time with diabetes: since year 2000 Treated with: Oral agents Immunological Musculoskeletal Medical History: Positive for: Osteomyelitis Neurologic Medical History: Positive for: Neuropathy Oncologic HBO Extended History Items Eyes: Cataracts Immunizations Pneumococcal Vaccine: Received Pneumococcal Vaccination: No Implantable Devices No devices added Hospitalization / Surgery History Type of Hospitalization/Surgery right transmet amputation right BKA brain surgery c-section IandD left plantar ulcer tubal ligation Family and  Social History Cancer: Yes - Father; Diabetes: Yes - Mother,Siblings,Father,Child; Hypertension: Yes - Mother,Father; Never smoker; Marital Status - Widowed; Alcohol Use: Rarely; Drug Use: No History; Caffeine Use: Rarely; Financial Concerns: No; Food, Clothing or Shelter Needs: No; Support System Lacking: No; Transportation Concerns: No Psychologist, prison and probation services) Signed: 08/30/2019 6:08:30 PM By: Baltazar Najjar MD Signed: 08/31/2019 5:56:33 PM By: [name] Signed: 08/31/2019 6:00:28 PM By: [name], [name] Entered By: [name] on 08/30/2019 10:59:33 -------------------------------------------------------------------------------- SuperBill Details Patient Name: Date of Service: MA CRISTY, COLMENARES 08/30/2019 Medical Record Number: [id number] Patient Account Number: [id number] Date of Birth/Sex: Treating RN: 01-29-1965 (54 y.o. Wynelle Link Primary Care Provider: Heywood Iles Other Clinician: Referring Provider: Treating Provider/Extender: Grayland Jack, Hope Budds Weeks in Treatment: 0 Diagnosis Coding ICD-10 Codes Code Description E11.621 Type 2 diabetes mellitus with foot ulcer L97.523 Non-pressure chronic ulcer of other part of left foot with necrosis of muscle Z89.511 Acquired absence of right leg below knee M86.672 Other chronic osteomyelitis, left ankle and foot Facility Procedures CPT4 Code: 78295621 Description: 99213 - WOUND CARE VISIT-LEV 3 EST PT Modifier: 25 Quantity: 1 CPT4 Code: 30865784 Description: 11042 - DEB SUBQ TISSUE 20 SQ CM/< ICD-10 Diagnosis Description L97.523 Non-pressure chronic ulcer of other part of left foot with necrosis of muscle Modifier: Quantity: 1 Physician Procedures : CPT4 Code Description Modifier [id number] 99204 - WC PHYS LEVEL 4 - NEW PT 25 ICD-10 Diagnosis Description E11.621 Type 2 diabetes mellitus with foot ulcer L97.523 Non-pressure chronic ulcer of other part of left foot with necrosis of muscle  M86.672 Other  chronic osteomyelitis, left ankle and foot Quantity: 1 : [id number] 11042 - WC PHYS SUBQ TISS 20 SQ CM ICD-10 Diagnosis Description L97.523 Non-pressure chronic ulcer of other part of left foot with necrosis of muscle Quantity: 1 Electronic Signature(s) Signed: 08/30/2019 4:47:33 PM By: Zandra Abts RN, [name] Signed: 08/30/2019 6:08:30 PM By: Baltazar Najjar MD Entered By: Zandra Abts on 08/30/2019 12:54:22

## 2019-09-03 ENCOUNTER — Ambulatory Visit (INDEPENDENT_AMBULATORY_CARE_PROVIDER_SITE_OTHER): Payer: Medicare Other | Admitting: Podiatry

## 2019-09-03 ENCOUNTER — Other Ambulatory Visit: Payer: Self-pay

## 2019-09-03 VITALS — Temp 98.3°F

## 2019-09-03 DIAGNOSIS — L03119 Cellulitis of unspecified part of limb: Secondary | ICD-10-CM

## 2019-09-03 DIAGNOSIS — Z89511 Acquired absence of right leg below knee: Secondary | ICD-10-CM

## 2019-09-03 DIAGNOSIS — E1142 Type 2 diabetes mellitus with diabetic polyneuropathy: Secondary | ICD-10-CM | POA: Diagnosis not present

## 2019-09-03 DIAGNOSIS — L97423 Non-pressure chronic ulcer of left heel and midfoot with necrosis of muscle: Secondary | ICD-10-CM | POA: Diagnosis not present

## 2019-09-03 DIAGNOSIS — E11621 Type 2 diabetes mellitus with foot ulcer: Secondary | ICD-10-CM

## 2019-09-03 MED ORDER — SANTYL 250 UNIT/GM EX OINT: 1.0000 "application " | TOPICAL_OINTMENT | Freq: Every day | CUTANEOUS | 0 refills | Status: AC

## 2019-09-03 NOTE — Patient Instructions (Signed)
Home wound care:  Switch to santyl, apply nickel thick with overlying saline gauze, calcium alginate, dry sterile dressings. Change 3x weekly

## 2019-09-03 NOTE — Progress Notes (Signed)
Subjective:  Patient ID: [name], female    DOB: 22-Feb-1965,  MRN: [id number]  Chief Complaint  Patient presents with  . Wound Check    Left plantar foot multiple wounds, 1 month duration, history of infection. Pt denies fever/nausea/vomiting/chills.    54 y.o. female presents with the above complaint. History confirmed with patient.  Wounds have been present for a little over a month.  She has been treated at the wound care center for this.  Last week the wound culture was taken and she was referred to our office for further management.  An MRI is scheduled for this Friday 9/3.  Objective:  Physical Exam: warm, good capillary refill, normal DP and PT pulses, reduced sensation to [employer] monofilament and throughout the left lower extremity and ulceration at left midfoot and forefoot plantarly. Left Foot:   Multiple lower extremity wounds as follows:  Hallux 2.0 x 1.0 x 0.3 cm Midfoot distal: 2.5 x 1.0 x 2.5 cm Midfoot proximal: 3.5 x 1.5 x 2.5 cm with 7cm deep tunneling to the plantar lateral foot towards the peroneal tendons, probes directly to bone  All wounds with significant fibrotic base, hyperkeratosis serosanguineous drainage and malodor.  Warmth and erythema to the foot above the wounds.       Radiographs: X-ray 08/02/2019 no evidence of osteomyelitis Assessment:   1. Diabetic ulcer of left midfoot associated with type 2 diabetes mellitus, with necrosis of muscle ([place])   2. Cellulitis of foot   3. Type 2 diabetes mellitus with diabetic polyneuropathy, without long-term current use of insulin ([place])   4. History of below knee amputation, right [facility])      Plan:  Patient was evaluated and treated and all questions answered.  Ulcers left plantar forefoot and midfoot -Debridement as below. -Dressed with Iodosorb, DSD. -Rx for Santyl sent to pharmacy begin use every other day -Continue NWB for the LLE -Discussed with her the etiology and treatment  options for her wounds.  She has chronic neuropathic wounds which have worsened significantly.   - She has palpable pulses and her A1c was recently 5.5%, and she is a minimal ambulator. I am concerned that she has a deeper infection such as osteomyelitis.  The MRI believe will be very helpful on Friday.  I have ordered laboratory work occluding a CBC, ESR, and CRP.   Procedure: Excisional Debridement of Wound Rationale: Removal of non-viable soft tissue from the wound to promote healing.  Anesthesia: none Pre-Debridement Wound Measurements: Hallux 2.0 x 1.0 x 0.3 cm Midfoot distal: 2.5 x 1.0 x 2.5 cm Midfoot proximal: 3.5 x 1.5 x 2.5 cm Post-Debridement Wound Measurements:  Hallux 2.0 x 1.5 x 0.5 cm Midfoot distal: 2.5 x 1.5 x 2.5 cm Midfoot proximal: 3.5 x 2.0 x 3.0 cm  Type of Debridement: Sharp Excisional Tissue Removed: Non-viable soft tissue Depth of Debridement: subcutaneous tissue. Technique: Sharp excisional debridement to bleeding, viable wound base.  Dressing: Dry, sterile, compression dressing. Disposition: Patient tolerated procedure well. Patient to return in 1 week for follow-up.  Return in about 2 weeks (around 09/17/2019).    Lanae Crumbly, DPM 09/04/2019

## 2019-09-04 LAB — AEROBIC CULTURE W GRAM STAIN (SUPERFICIAL SPECIMEN): Gram Stain: NONE SEEN

## 2019-09-04 NOTE — Progress Notes (Signed)
Patient Care Team: Cheryl Francois, NP as PCP - General (Adult Health Nurse Practitioner)  DIAGNOSIS:    ICD-10-CM   1. Idiopathic thrombocytopenic purpura (ITP) (HCC)  D69.3 CBC with Differential (Cancer Center Only)    CHIEF COMPLIANT: Follow-up of relapsed ITP onRituxan  INTERVAL HISTORY: [name] is a 54 y.o. with above-mentioned history of severe thrombocytopeniacurrently onRituxan and IVIG. She presents to the clinic todayforfollow-up.She was recently hospitalized for cellulitis and was able to be discharged home.  Her platelet counts in the hospital were in the 300s.  Today her platelet count is 110.  She needs another cataract surgery and the it has been scheduled for 09/14/2019.  ALLERGIES:  has No Known Allergies.  MEDICATIONS:  Current Outpatient Medications  Medication Sig Dispense Refill  . acetaminophen (TYLENOL) 500 MG tablet Take 1,000 mg by mouth every 6 (six) hours as needed for mild pain.    Marland Kitchen amLODipine (NORVASC) 5 MG tablet Take 5 mg by mouth daily.    Marland Kitchen aspirin EC 81 MG tablet Take 81 mg by mouth daily as needed for moderate pain. Swallow whole.    . Blood Glucose Monitoring Suppl (PRODIGY AUTOCODE BLOOD GLUCOSE) w/Device KIT 1 kit by Does not apply route in the morning, at noon, in the evening, and at bedtime. 1 kit 0  . collagenase (SANTYL) ointment Apply 1 application topically daily. Wound #1: 2.0 x 1.0 x 0.3cm Wound #2: 2.5 x 1.0 x 2.5 cm Wound #3: 3.5 x 1.5 x 2.5 cm 30 g 0  . gabapentin (NEURONTIN) 400 MG capsule Take 1 capsule (400 mg total) by mouth 2 (two) times daily. 60 capsule 0  . isosorbide mononitrate (IMDUR) 30 MG 24 hr tablet Take 1 tablet (30 mg total) by mouth daily. 90 tablet 1  . metFORMIN (GLUCOPHAGE) 500 MG tablet Take 1 tablet (500 mg total) by mouth 2 (two) times daily with a meal. (Patient taking differently: Take 500 mg by mouth daily with breakfast. ) 60 tablet 1  . metoprolol succinate (TOPROL-XL) 50 MG 24 hr tablet Take 1  tablet (50 mg total) by mouth daily. Take with or immediately following a meal. 90 tablet 1  . ofloxacin (OCUFLOX) 0.3 % ophthalmic solution Place into the right eye.    . prednisoLONE acetate (PRED FORTE) 1 % ophthalmic suspension Place into the right eye.    . rosuvastatin (CRESTOR) 20 MG tablet Take 1 tablet (20 mg total) by mouth daily. 90 tablet 1   No current facility-administered medications for this visit.    PHYSICAL EXAMINATION: ECOG PERFORMANCE STATUS: 1 - Symptomatic but completely ambulatory  Vitals:   09/05/19 1416  BP: 133/60  Pulse: 68  Resp: 17  Temp: (!) 97.1 F (36.2 C)  SpO2: 98%   There were no vitals filed for this visit.  LABORATORY DATA:  I have reviewed the data as listed CMP Latest Ref Rng & Units 09/05/2019 08/08/2019 08/07/2019  Glucose 70 - 99 mg/dL 137(H) 117(H) 103(H)  BUN 6 - 20 mg/dL 29(H) 14 22(H)  Creatinine 0.44 - 1.00 mg/dL 1.05(H) 0.88 1.05(H)  Sodium 135 - 145 mmol/L 136 137 143  Potassium 3.5 - 5.1 mmol/L 5.3(H) 3.4(L) 3.6  Chloride 98 - 111 mmol/L 106 108 111  CO2 22 - 32 mmol/L 20(L) 24 22  Calcium 8.9 - 10.3 mg/dL 9.6 8.3(L) 8.4(L)  Total Protein 6.5 - 8.1 g/dL 7.9 - -  Total Bilirubin 0.3 - 1.2 mg/dL 0.3 - -  Alkaline Phos 38 -  126 U/L 87 - -  AST 15 - 41 U/L 17 - -  ALT 0 - 44 U/L 10 - -    Lab Results  Component Value Date   WBC 6.2 09/05/2019   HGB 11.5 (L) 09/05/2019   HCT 36.9 09/05/2019   MCV 89.6 09/05/2019   PLT 110 (L) 09/05/2019   NEUTROABS 4.8 09/05/2019    ASSESSMENT & PLAN:  Idiopathic thrombocytopenic purpura (ITP) (HCC) Relapsing ITP: Patient response to steroids and IVIG but because she is a diabetic we cannot use steroids. IVIG response appeared to only last for a couple of months. Prior IVIG: March 2021, April 2021 Because of her history of heart failure I am also worried about the volume of fluid that she gets with IVIG. Lab review: Platelet count 16 on  05/16/2019 ----------------------------------------------------------------------------------------------------------------------------------------------------------------- Current treatment: Rituxan weekly x4 started 05/23/2019 (patient missed her treatment on 05/30/2019) today is third treatment (patient missed multiple appointments) Repeat IVIG treatment 06/27/2019 Rituxan toxicities: Headaches and body aches after Rituxan. Hospitalization: 06/25/2019-06/29/2019: Nausea and vomiting Hospitalization 08/02/2019-08/09/2019 cellulitis left foot  08/08/2019: Platelets 301, hemoglobin 8.6 09/05/2019: Platelets 110, hemoglobin 11.5 I discussed with her that her platelet counts are adequate for cataract surgery. We will see her back in 3 weeks with recheck of labs and follow-up.    Orders Placed This Encounter  Procedures  . CBC with Differential (Cancer Center Only)    Standing Status:   Future    Standing Expiration Date:   09/04/2020   The patient has a good understanding of the overall plan. she agrees with it. she will call with any problems that may develop before the next visit here.  Total time spent: 20 mins including face to face time and time spent for planning, charting and coordination of care  Cheryl Lose, MD 09/05/2019  I, [name], am acting as scribe for Dr. Nicholas Wolf.  I have reviewed the above documentation for accuracy and completeness, and I agree with the above.

## 2019-09-05 ENCOUNTER — Inpatient Hospital Stay: Payer: Medicare Other

## 2019-09-05 ENCOUNTER — Other Ambulatory Visit: Payer: Self-pay

## 2019-09-05 ENCOUNTER — Encounter: Payer: Self-pay | Admitting: *Deleted

## 2019-09-05 ENCOUNTER — Inpatient Hospital Stay: Payer: Medicare Other | Attending: Hematology and Oncology | Admitting: Hematology and Oncology

## 2019-09-05 DIAGNOSIS — D693 Immune thrombocytopenic purpura: Secondary | ICD-10-CM | POA: Insufficient documentation

## 2019-09-05 DIAGNOSIS — Z79899 Other long term (current) drug therapy: Secondary | ICD-10-CM | POA: Diagnosis not present

## 2019-09-05 DIAGNOSIS — Z7982 Long term (current) use of aspirin: Secondary | ICD-10-CM | POA: Diagnosis not present

## 2019-09-05 LAB — CBC WITH DIFFERENTIAL (CANCER CENTER ONLY)
Abs Immature Granulocytes: 0.02 10*3/uL (ref 0.00–0.07)
Basophils Absolute: 0 10*3/uL (ref 0.0–0.1)
Basophils Relative: 0 %
Eosinophils Absolute: 0 10*3/uL (ref 0.0–0.5)
Eosinophils Relative: 0 %
HCT: 36.9 % (ref 36.0–46.0)
Hemoglobin: 11.5 g/dL — ABNORMAL LOW (ref 12.0–15.0)
Immature Granulocytes: 0 %
Lymphocytes Relative: 15 %
Lymphs Abs: 1 10*3/uL (ref 0.7–4.0)
MCH: 27.9 pg (ref 26.0–34.0)
MCHC: 31.2 g/dL (ref 30.0–36.0)
MCV: 89.6 fL (ref 80.0–100.0)
Monocytes Absolute: 0.4 10*3/uL (ref 0.1–1.0)
Monocytes Relative: 7 %
Neutro Abs: 4.8 10*3/uL (ref 1.7–7.7)
Neutrophils Relative %: 78 %
Platelet Count: 110 10*3/uL — ABNORMAL LOW (ref 150–400)
RBC: 4.12 MIL/uL (ref 3.87–5.11)
RDW: 17.2 % — ABNORMAL HIGH (ref 11.5–15.5)
WBC Count: 6.2 10*3/uL (ref 4.0–10.5)
nRBC: 0 % (ref 0.0–0.2)

## 2019-09-05 LAB — CMP (CANCER CENTER ONLY)
ALT: 10 U/L (ref 0–44)
AST: 17 U/L (ref 15–41)
Albumin: 3.6 g/dL (ref 3.5–5.0)
Alkaline Phosphatase: 87 U/L (ref 38–126)
Anion gap: 10 (ref 5–15)
BUN: 29 mg/dL — ABNORMAL HIGH (ref 6–20)
CO2: 20 mmol/L — ABNORMAL LOW (ref 22–32)
Calcium: 9.6 mg/dL (ref 8.9–10.3)
Chloride: 106 mmol/L (ref 98–111)
Creatinine: 1.05 mg/dL — ABNORMAL HIGH (ref 0.44–1.00)
GFR, Est AFR Am: >60 mL/min (ref >60)
GFR, Estimated: >60 mL/min (ref >60)
Glucose, Bld: 137 mg/dL — ABNORMAL HIGH (ref 70–99)
Potassium: 5.3 mmol/L — ABNORMAL HIGH (ref 3.5–5.1)
Sodium: 136 mmol/L (ref 135–145)
Total Bilirubin: 0.3 mg/dL (ref 0.3–1.2)
Total Protein: 7.9 g/dL (ref 6.5–8.1)

## 2019-09-05 NOTE — Progress Notes (Signed)
Per MD okay for pt to undergo second cataract eye surgery.  RN successfully faxed medical clearance to [facility].

## 2019-09-05 NOTE — Assessment & Plan Note (Signed)
Relapsing ITP: Patient response to steroids and IVIG but because she is a diabetic we cannot use steroids. IVIG response appeared to only last for a couple of months. Prior IVIG: March 2021, April 2021 Because of her history of heart failure I am also worried about the volume of fluid that she gets with IVIG. Lab review: Platelet count 16 on 05/16/2019 ----------------------------------------------------------------------------------------------------------------------------------------------------------------- Current treatment: Rituxan weekly x4 started 05/23/2019 (patient missed her treatment on 05/30/2019) today is third treatment (patient missed multiple appointments) Repeat IVIG treatment 06/27/2019 Rituxan toxicities: Headaches and body aches after Rituxan. Hospitalization: 06/25/2019-06/29/2019: Nausea and vomiting Hospitalization 08/02/2019-08/09/2019 cellulitis left foot  08/08/2019: Platelets 301, hemoglobin 8.6 Anemia is due to chronic disease as well as acute illness

## 2019-09-06 ENCOUNTER — Encounter (HOSPITAL_BASED_OUTPATIENT_CLINIC_OR_DEPARTMENT_OTHER): Payer: Medicare Other | Admitting: Internal Medicine

## 2019-09-07 ENCOUNTER — Other Ambulatory Visit: Payer: Self-pay

## 2019-09-07 ENCOUNTER — Other Ambulatory Visit (HOSPITAL_COMMUNITY): Payer: Self-pay | Admitting: Internal Medicine

## 2019-09-07 ENCOUNTER — Ambulatory Visit (HOSPITAL_COMMUNITY)
Admission: RE | Admit: 2019-09-07 | Discharge: 2019-09-07 | Disposition: A | Discharge status: Home / Self Care | Payer: Medicare Other | Source: Ambulatory Visit | Attending: Internal Medicine | Admitting: Internal Medicine

## 2019-09-07 DIAGNOSIS — M869 Osteomyelitis, unspecified: Secondary | ICD-10-CM | POA: Diagnosis present

## 2019-09-07 DIAGNOSIS — E1169 Type 2 diabetes mellitus with other specified complication: Secondary | ICD-10-CM | POA: Diagnosis present

## 2019-09-07 DIAGNOSIS — E11621 Type 2 diabetes mellitus with foot ulcer: Secondary | ICD-10-CM

## 2019-09-07 DIAGNOSIS — L97509 Non-pressure chronic ulcer of other part of unspecified foot with unspecified severity: Secondary | ICD-10-CM | POA: Diagnosis present

## 2019-09-07 MED ORDER — GADOBUTROL 1 MMOL/ML IV SOLN: 10.0000 mL | Freq: Once | INTRAVENOUS | Status: DC | PRN

## 2019-09-11 ENCOUNTER — Encounter (HOSPITAL_BASED_OUTPATIENT_CLINIC_OR_DEPARTMENT_OTHER): Payer: Medicare Other | Attending: Internal Medicine | Admitting: Internal Medicine

## 2019-09-11 ENCOUNTER — Other Ambulatory Visit: Payer: Self-pay

## 2019-09-11 DIAGNOSIS — N183 Chronic kidney disease, stage 3 unspecified: Secondary | ICD-10-CM | POA: Insufficient documentation

## 2019-09-11 DIAGNOSIS — L97523 Non-pressure chronic ulcer of other part of left foot with necrosis of muscle: Secondary | ICD-10-CM | POA: Insufficient documentation

## 2019-09-11 DIAGNOSIS — I5032 Chronic diastolic (congestive) heart failure: Secondary | ICD-10-CM | POA: Diagnosis not present

## 2019-09-11 DIAGNOSIS — Z993 Dependence on wheelchair: Secondary | ICD-10-CM | POA: Diagnosis not present

## 2019-09-11 DIAGNOSIS — E1142 Type 2 diabetes mellitus with diabetic polyneuropathy: Secondary | ICD-10-CM | POA: Diagnosis not present

## 2019-09-11 DIAGNOSIS — E11621 Type 2 diabetes mellitus with foot ulcer: Secondary | ICD-10-CM | POA: Insufficient documentation

## 2019-09-11 DIAGNOSIS — M86672 Other chronic osteomyelitis, left ankle and foot: Secondary | ICD-10-CM | POA: Diagnosis not present

## 2019-09-11 DIAGNOSIS — D631 Anemia in chronic kidney disease: Secondary | ICD-10-CM | POA: Diagnosis not present

## 2019-09-11 DIAGNOSIS — E1122 Type 2 diabetes mellitus with diabetic chronic kidney disease: Secondary | ICD-10-CM | POA: Diagnosis not present

## 2019-09-11 DIAGNOSIS — Z89511 Acquired absence of right leg below knee: Secondary | ICD-10-CM | POA: Insufficient documentation

## 2019-09-11 DIAGNOSIS — I130 Hypertensive heart and chronic kidney disease with heart failure and stage 1 through stage 4 chronic kidney disease, or unspecified chronic kidney disease: Secondary | ICD-10-CM | POA: Insufficient documentation

## 2019-09-12 NOTE — Progress Notes (Signed)
Cheryl, Wolf ([id number]) Visit Report for 09/11/2019 Arrival Information Details Patient Name: Date of Service: [name], [name] 09/11/2019 3:00 PM Medical Record Number: [id number] Patient Account Number: [id number] Date of Birth/Sex: Treating RN: [birth year] (54 y.o. [name] Primary Care [name]: [name] Other Clinician: Referring [name]: Treating [name]/Extender: [name], [name] Weeks in Treatment: 1 Visit Information History Since Last Visit Added or deleted any medications: No Patient Arrived: Wheel Chair Any new allergies or adverse reactions: No Arrival Time: 15:32 Had a fall or experienced change in No Accompanied By: self activities of daily living that may affect Transfer Assistance: None risk of falls: Patient Identification Verified: Yes Signs or symptoms of abuse/neglect since last visito No Secondary Verification Process Completed: Yes Hospitalized since last visit: No Implantable device outside of the clinic excluding No cellular tissue based products placed in the center since last visit: Has Dressing in Place as Prescribed: Yes Pain Present Now: No Electronic Signature(s) Signed: 09/12/2019 1:03:45 PM By: [name] Entered By: [name] on 09/11/2019 15:33:40 -------------------------------------------------------------------------------- Encounter Discharge Information Details Patient Name: Date of Service: Cheryl [name]. 09/11/2019 3:00 PM Medical Record Number: [id number] Patient Account Number: [id number] Date of Birth/Sex: Treating RN: Mar 28, 1965 (54 y.o. [name] Primary Care [name]: [name] Other Clinician: Referring [name]: Treating [name]/Extender: [name], [name] Weeks in Treatment: 1 Encounter Discharge Information Items Post Procedure Vitals Discharge Condition: Stable Temperature (F): 97.9 Ambulatory Status: Wheelchair Pulse (bpm): 72 Discharge  Destination: Home Respiratory Rate (breaths/min): 18 Transportation: Other Blood Pressure (mmHg): 126/74 Accompanied By: self Schedule Follow-up Appointment: Yes Clinical Summary of Care: Patient Declined Electronic Signature(s) Signed: 09/11/2019 5:24:19 PM By: [name] Entered By: [name] on 09/11/2019 17:22:34 -------------------------------------------------------------------------------- Lower Extremity Assessment Details Patient Name: Date of Service: Cheryl Cheryl, Wolf 09/11/2019 3:00 PM Medical Record Number: [id number] Patient Account Number: [id number] Date of Birth/Sex: Treating RN: [birth year] (54 y.o. Tommye Wolf Primary Care [name]: [name] Other Clinician: Referring [name]: Treating [name]/Extender: [name], [name] Weeks in Treatment: 1 Edema Assessment Assessed: [Left: No] [Right: No] Edema: [Left: Ye] [Right: s] Calf Left: Right: Point of Measurement: cm From Medial Instep 37.4 cm cm Ankle Left: Right: Point of Measurement: cm From Medial Instep 23.4 cm cm Vascular Assessment Pulses: Dorsalis Pedis Palpable: [Left:Yes] Electronic Signature(s) Signed: 09/11/2019 5:29:26 PM By: Cheryl Deed RN, [name] Entered By: [name] on 09/11/2019 15:59:03 -------------------------------------------------------------------------------- Multi Wound Chart Details Patient Name: Date of Service: Cheryl [name]. 09/11/2019 3:00 PM Medical Record Number: [id number] Patient Account Number: [id number] Date of Birth/Sex: Treating RN: 1965-09-09 (54 y.o. [name] Primary Care Janes Colegrove: [name] Other Clinician: Referring [name]: Treating [name]/Extender: [name], [name] Weeks in Treatment: 1 Vital Signs Height(in): 67 Pulse(bpm): 72 Weight(lbs): 200 Blood Pressure(mmHg): 126/74 Body Mass Index(BMI): 31 Temperature(F): 97.9 Respiratory Rate(breaths/min): 18 Photos: [1:No  Photos Left, Proximal, Plantar Foot] [2:No Photos Left, Distal, Plantar Foot] [3:No Photos Left Metatarsal head first] Wound Location: [1:Blister] [2:Blister] [3:Blister] Wounding Event: [1:Diabetic Wound/Ulcer of the Lower] [2:Diabetic Wound/Ulcer of the Lower] [3:Diabetic Wound/Ulcer of the Lower] Primary Etiology: [1:Extremity Cataracts, Anemia, Congestive Heart] [2:Extremity Cataracts, Anemia, Congestive Heart] [3:Extremity Cataracts, Anemia, Congestive Heart] Comorbid History: [1:Failure, Hypertension, Type II Diabetes, Osteomyelitis, Neuropathy 08/02/2019] [2:Failure, Hypertension, Type II Diabetes, Osteomyelitis, Neuropathy 08/02/2019] [3:Failure, Hypertension, Type II Diabetes, Osteomyelitis, Neuropathy  08/02/2019] Date Acquired: [1:1] [2:1] [3:1] Weeks of Treatment: [1:Open] [2:Open] [3:Open] Wound Status: [1:1.1x2.8x1.8] [2:1x1.9x0.8] [3:1.3x1x0.2]  Measurements Wolf x W x D (cm) [1:2.419] [2:1.492] [3:1.021] A (cm) : rea [1:4.354] [2:1.194] [3:0.204] Volume (cm) : [1:45.00%] [2:36.70%] [3:-35.40%] % Reduction in A rea: [1:58.80%] [2:49.30%] [3:61.40%] % Reduction in Volume: [2:9] Position 1 (o'clock): [2:1.1] Maximum Distance 1 (cm): [1:8] [2:2] Starting Position 1 (o'clock): [1:9] [2:4] Ending Position 1 (o'clock): [1:1] [2:0.6] Maximum Distance 1 (cm): [1:No] [2:Yes] [3:No] Tunneling: [1:Yes] [2:Yes] [3:No] Undermining: [1:Grade 2] [2:Grade 2] [3:Grade 3] Classification: [1:N/A] [2:N/A] [3:MRI] Wagner Verification: [1:Medium] [2:Medium] [3:Small] Exudate A mount: [1:Purulent] [2:Serosanguineous] [3:Serosanguineous] Exudate Type: [1:yellow, brown, green] [2:red, brown] [3:red, brown] Exudate Color: [1:Well defined, not attached] [2:Well defined, not attached] [3:Well defined, not attached] Wound Margin: [1:None Present (0%)] [2:Small (1-33%)] [3:Medium (34-66%)] Granulation A mount: [1:N/A] [2:Pink] [3:Pink] Granulation Quality: [1:Large (67-100%)] [2:Large (67-100%)] [3:Medium  (34-66%)] Necrotic A mount: [1:Fat Layer (Subcutaneous Tissue): Yes Fat Layer (Subcutaneous Tissue): Yes Fat Layer (Subcutaneous Tissue): Yes] Exposed Structures: [1:Muscle: Yes Fascia: No Tendon: No Joint: No Bone: No None] [2:Tendon: Yes Fascia: No Muscle: No Joint: No Bone: No None] [3:Fascia: No Tendon: No Muscle: No Joint: No Bone: No None] Epithelialization: [1:Debridement - Excisional] [2:Debridement - Excisional] [3:N/A] Debridement: Pre-procedure Verification/Time Out 16:40 [2:16:40] [3:N/A] Taken: [1:Lidocaine 5% topical ointment] [2:Lidocaine 5% topical ointment] [3:N/A] Pain Control: [1:Subcutaneous, Slough] [2:Subcutaneous, Slough] [3:N/A] Tissue Debrided: [1:Skin/Subcutaneous Tissue] [2:Skin/Subcutaneous Tissue] [3:N/A] Level: [1:3.08] [2:1.9] [3:N/A] Debridement A (sq cm): [1:rea Curette] [2:Curette] [3:N/A] Instrument: [1:Moderate] [2:Moderate] [3:N/A] Bleeding: [1:Pressure] [2:Pressure] [3:N/A] Hemostasis A chieved: [1:0] [2:0] [3:N/A] Procedural Pain: [1:0] [2:0] [3:N/A] Post Procedural Pain: [1:Procedure was tolerated well] [2:Procedure was tolerated well] [3:N/A] Debridement Treatment Response: [1:1.1x2.8x1.8] [2:1x1x0.8] [3:N/A] Post Debridement Measurements Wolf x W x D (cm) [1:4.354] [2:0.628] [3:N/A] Post Debridement Volume: (cm) [1:Debridement] [2:Debridement] [3:N/A] Treatment Notes Electronic Signature(s) Signed: 09/11/2019 5:12:55 PM By: Baltazar Najjar MD Signed: 09/12/2019 5:14:09 PM By: Yevonne Pax RN Entered By: Baltazar Najjar on 09/11/2019 16:47:47 -------------------------------------------------------------------------------- Multi-Disciplinary Care Plan Details Patient Name: Date of Service: Cheryl CARY, WILFORD 09/11/2019 3:00 PM Medical Record Number: [id number] Patient Account Number: [id number] Date of Birth/Sex: Treating RN: 08-03-1965 (53 y.o. [name] Primary Care Jackelyne Sayer: [name] Other Clinician: Referring Hemi Chacko: Treating  Hikeem Andersson/Extender: [name], [name] Weeks in Treatment: 1 Active Inactive Abuse / Safety / Falls / Self Care Management Nursing Diagnoses: Potential for falls Potential for injury related to falls Goals: Patient will not experience any injury related to falls Date Initiated: 08/30/2019 Target Resolution Date: 09/28/2019 Goal Status: Active Patient/caregiver will verbalize/demonstrate measures taken to prevent injury and/or falls Date Initiated: 08/30/2019 Target Resolution Date: 09/28/2019 Goal Status: Active Interventions: Assess Activities of Daily Living upon admission and as needed Assess fall risk on admission and as needed Assess: immobility, friction, shearing, incontinence upon admission and as needed Assess impairment of mobility on admission and as needed per policy Assess personal safety and home safety (as indicated) on admission and as needed Assess self care needs on admission and as needed Provide education on fall prevention Provide education on personal and home safety Notes: Nutrition Nursing Diagnoses: Impaired glucose control: actual or potential Potential for alteratiion in Nutrition/Potential for imbalanced nutrition Goals: Patient/caregiver agrees to and verbalizes understanding of need to use nutritional supplements and/or vitamins as prescribed Date Initiated: 08/30/2019 Target Resolution Date: 09/28/2019 Goal Status: Active Patient/caregiver will maintain therapeutic glucose control Date Initiated: 08/30/2019 Target Resolution Date: 09/28/2019 Goal Status: Active Interventions: Assess HgA1c results as ordered upon admission and as needed Assess patient nutrition upon admission and as needed per  policy Provide education on elevated blood sugars and impact on wound healing Provide education on nutrition Treatment Activities: Education provided on Nutrition : 08/30/2019 Notes: Wound/Skin Impairment Nursing Diagnoses: Impaired tissue  integrity Knowledge deficit related to ulceration/compromised skin integrity Goals: Patient/caregiver will verbalize understanding of skin care regimen Date Initiated: 08/30/2019 Target Resolution Date: 09/28/2019 Goal Status: Active Interventions: Assess patient/caregiver ability to obtain necessary supplies Assess patient/caregiver ability to perform ulcer/skin care regimen upon admission and as needed Assess ulceration(s) every visit Provide education on ulcer and skin care Notes: Electronic Signature(s) Signed: 09/12/2019 5:14:09 PM By: Yevonne Pax RN Entered By: Yevonne Pax on 09/11/2019 15:20:34 -------------------------------------------------------------------------------- Pain Assessment Details Patient Name: Date of Service: Cheryl, Wolf 09/11/2019 3:00 PM Medical Record Number: [id number] Patient Account Number: [id number] Date of Birth/Sex: Treating RN: June 06, 1965 (54 y.o. [name] Primary Care Celest Reitz: [name] Other Clinician: Referring Lunetta Marina: Treating Arlene Genova/Extender: [name], [name] Weeks in Treatment: 1 Active Problems Location of Pain Severity and Description of Pain Patient Has Paino No Site Locations Pain Management and Medication Current Pain Management: Electronic Signature(s) Signed: 09/12/2019 1:03:45 PM By: [name] Signed: 09/12/2019 5:14:09 PM By: Yevonne Pax RN Entered By: [name] on 09/11/2019 15:34:10 -------------------------------------------------------------------------------- Patient/Caregiver Education Details Patient Name: Date of Service: Cheryl [name] 9/7/2021andnbsp3:00 PM Medical Record Number: [id number] Patient Account Number: [id number] Date of Birth/Gender: Treating RN: Jul 07, 1965 (54 y.o. [name] Primary Care Physician: [name] Other Clinician: Referring Physician: Treating Physician/Extender: Corliss Blacker in Treatment:  1 Education Assessment Education Provided To: Patient Education Topics Provided Elevated Blood Sugar/ Impact on Healing: Methods: Explain/Verbal Responses: State content correctly Nutrition: Methods: Explain/Verbal Responses: State content correctly Electronic Signature(s) Signed: 09/12/2019 5:14:09 PM By: Yevonne Pax RN Entered By: Yevonne Pax on 09/11/2019 15:21:01 -------------------------------------------------------------------------------- Wound Assessment Details Patient Name: Date of Service: Cheryl GIZELL, DANSER 09/11/2019 3:00 PM Medical Record Number: [id number] Patient Account Number: [id number] Date of Birth/Sex: Treating RN: [birth year] (54 y.o. [name] Primary Care Wolfe Camarena: [name] Other Clinician: Referring Jonell Krontz: Treating Tyler Cubit/Extender: [name], [name] Weeks in Treatment: 1 Wound Status Wound Number: 1 Primary Diabetic Wound/Ulcer of the Lower Extremity Etiology: Wound Location: Left, Proximal, Plantar Foot Wound Open Wounding Event: Blister Status: Date Acquired: 08/02/2019 Comorbid Cataracts, Anemia, Congestive Heart Failure, Hypertension, Type Weeks Of Treatment: 1 History: II Diabetes, Osteomyelitis, Neuropathy Clustered Wound: No Wound Measurements Length: (cm) 1.1 Width: (cm) 2.8 Depth: (cm) 1.8 Area: (cm) 2.419 Volume: (cm) 4.354 % Reduction in Area: 45% % Reduction in Volume: 58.8% Epithelialization: None Tunneling: No Undermining: Yes Starting Position (o'clock): 8 Ending Position (o'clock): 9 Maximum Distance: (cm) 1 Wound Description Classification: Grade 2 Wound Margin: Well defined, not attached Exudate Amount: Medium Exudate Type: Purulent Exudate Color: yellow, brown, green Foul Odor After Cleansing: No Slough/Fibrino Yes Wound Bed Granulation Amount: None Present (0%) Exposed Structure Necrotic Amount: Large (67-100%) Fascia Exposed: No Necrotic Quality: Adherent Slough Fat Layer  (Subcutaneous Tissue) Exposed: Yes Tendon Exposed: No Muscle Exposed: Yes Necrosis of Muscle: Yes Joint Exposed: No Bone Exposed: No Treatment Notes Wound #1 (Left, Proximal, Plantar Foot) 1. Cleanse With Wound Cleanser Soap and water 3. Primary Dressing Applied Calcium Alginate Ag 4. Secondary Dressing ABD Pad Dry Gauze Roll Gauze 5. Secured With Secretary/administrator) Signed: 09/11/2019 5:29:26 PM By: Cheryl Deed RN, [name] Signed: 09/12/2019 5:14:09 PM By: Yevonne Pax RN Entered By: [name] on 09/11/2019 16:08:56 -------------------------------------------------------------------------------- Wound Assessment Details  Patient Name: Date of Service: Brayton ElMA Wolf, [name]. 09/11/2019 3:00 PM Medical Record Number: [id number] Patient Account Number: [id number] Date of Birth/Sex: Treating RN: 01/23/1965 (54 y.o. Cheryl FinnerF) Wolf, Cheryl Primary Care Chris Cripps: Cheryl IlesKING, [name] Other Clinician: Referring Kalya Troeger: Treating Carolyne Whitsel/Extender: Cheryl Jackobson, Michael KING, [name] Weeks in Treatment: 1 Wound Status Wound Number: 2 Primary Diabetic Wound/Ulcer of the Lower Extremity Etiology: Wound Location: Left, Distal, Plantar Foot Wound Open Wounding Event: Blister Status: Date Acquired: 08/02/2019 Comorbid Cataracts, Anemia, Congestive Heart Failure, Hypertension, Type Weeks Of Treatment: 1 History: II Diabetes, Osteomyelitis, Neuropathy Clustered Wound: No Wound Measurements Length: (cm) 1 Width: (cm) 1.9 Depth: (cm) 0.8 Area: (cm) 1.492 Volume: (cm) 1.194 % Reduction in Area: 36.7% % Reduction in Volume: 49.3% Epithelialization: None Tunneling: Yes Position (o'clock): 9 Maximum Distance: (cm) 1.1 Undermining: Yes Starting Position (o'clock): 2 Ending Position (o'clock): 4 Maximum Distance: (cm) 0.6 Wound Description Classification: Grade 2 Wound Margin: Well defined, not attached Exudate Amount: Medium Exudate Type: Serosanguineous Exudate Color: red,  brown Foul Odor After Cleansing: No Slough/Fibrino Yes Wound Bed Granulation Amount: Small (1-33%) Exposed Structure Granulation Quality: Pink Fascia Exposed: No Necrotic Amount: Large (67-100%) Fat Layer (Subcutaneous Tissue) Exposed: Yes Necrotic Quality: Adherent Slough Tendon Exposed: Yes Muscle Exposed: No Joint Exposed: No Bone Exposed: No Treatment Notes Wound #2 (Left, Distal, Plantar Foot) 1. Cleanse With Wound Cleanser Soap and water 3. Primary Dressing Applied Calcium Alginate Ag 4. Secondary Dressing ABD Pad Dry Gauze Roll Gauze 5. Secured With Secretary/administratorTape Electronic Signature(s) Signed: 09/11/2019 5:29:26 PM By: Cheryl DeedBoehlein, Linda RN, [name] Signed: 09/12/2019 5:14:09 PM By: Yevonne PaxEpps, Carrie RN Entered By: Cheryl DeedBoehlein, Linda on 09/11/2019 16:03:49 -------------------------------------------------------------------------------- Wound Assessment Details Patient Name: Date of Service: Cheryl Cheryl ArRTIN, [name]. 09/11/2019 3:00 PM Medical Record Number: [id number] Patient Account Number: [id number] Date of Birth/Sex: Treating RN: 08/01/1965 (54 y.o. Cheryl FinnerF) Wolf, Cheryl Primary Care Oliverio Cho: Cheryl IlesKING, [name] Other Clinician: Referring Tavon Magnussen: Treating Briceida Rasberry/Extender: Cheryl Jackobson, Michael KING, [name] Weeks in Treatment: 1 Wound Status Wound Number: 3 Primary Diabetic Wound/Ulcer of the Lower Extremity Etiology: Wound Location: Left Metatarsal head first Wound Open Wounding Event: Blister Status: Date Acquired: 08/02/2019 Comorbid Cataracts, Anemia, Congestive Heart Failure, Hypertension, Type Weeks Of Treatment: 1 History: II Diabetes, Osteomyelitis, Neuropathy Clustered Wound: No Wound Measurements Length: (cm) 1.3 Width: (cm) 1 Depth: (cm) 0.2 Area: (cm) 1.021 Volume: (cm) 0.204 % Reduction in Area: -35.4% % Reduction in Volume: 61.4% Epithelialization: None Tunneling: No Undermining: No Wound Description Classification: Grade 3 Wagner Verification: MRI Wound Margin:  Well defined, not attached Exudate Amount: Small Exudate Type: Serosanguineous Exudate Color: red, brown Foul Odor After Cleansing: No Slough/Fibrino Yes Wound Bed Granulation Amount: Medium (34-66%) Exposed Structure Granulation Quality: Pink Fascia Exposed: No Necrotic Amount: Medium (34-66%) Fat Layer (Subcutaneous Tissue) Exposed: Yes Necrotic Quality: Adherent Slough Tendon Exposed: No Muscle Exposed: No Joint Exposed: No Bone Exposed: No Treatment Notes Wound #3 (Left Metatarsal head first) 1. Cleanse With Wound Cleanser Soap and water 3. Primary Dressing Applied Calcium Alginate Ag 4. Secondary Dressing ABD Pad Dry Gauze Roll Gauze 5. Secured With Secretary/administratorTape Electronic Signature(s) Signed: 09/11/2019 5:29:26 PM By: Cheryl DeedBoehlein, Linda RN, [name] Signed: 09/12/2019 5:14:09 PM By: Yevonne PaxEpps, Carrie RN Entered By: Cheryl DeedBoehlein, Linda on 09/11/2019 16:09:16 -------------------------------------------------------------------------------- Vitals Details Patient Name: Date of Service: Cheryl Cheryl ArRTIN, [name]. 09/11/2019 3:00 PM Medical Record Number: [id number] Patient Account Number: [id number] Date of Birth/Sex: Treating RN: 04/26/1965 (54 y.o. Cheryl FinnerF) Wolf, Cheryl Primary Care Zyria Fiscus: Cheryl IlesKING, [name] Other Clinician: Referring Treg Diemer: Treating Keyonni Percival/Extender:  [name], [name] Weeks in Treatment: 1 Vital Signs Time Taken: 15:33 Temperature (F): 97.9 Height (in): 67 Pulse (bpm): 72 Weight (lbs): 200 Respiratory Rate (breaths/min): 18 Body Mass Index (BMI): 31.3 Blood Pressure (mmHg): 126/74 Reference Range: 80 - 120 mg / dl Electronic Signature(s) Signed: 09/12/2019 1:03:45 PM By: [name] Entered By: [name] on 09/11/2019 15:34:02

## 2019-09-12 NOTE — Progress Notes (Signed)
HADJA, HARRAL ([id number]) Visit Report for 09/11/2019 Debridement Details Patient Name: Date of Service: [name], [name] 09/11/2019 3:00 PM Medical Record Number: [id number] Patient Account Number: [id number] Date of Birth/Sex: Treating RN: [birth year] (54 y.o. [name] Primary Care Provider: Heywood Iles Other Clinician: Referring Provider: Treating Provider/Extender: Grayland Jack, [name] Debridement Performed for Assessment: Wound #1 Left,Proximal,Plantar Foot Performed By: Physician Maxwell Caul., MD Debridement Type: Debridement Severity of Tissue Pre Debridement: Fat layer exposed Level of Consciousness (Pre-procedure): Awake and Alert Pre-procedure Verification/Time Out Yes - 16:40 Taken: Start Time: 16:40 Pain Control: Lidocaine 5% topical ointment T Area Debrided (L x W): otal 1.1 (cm) x 2.8 (cm) = 3.08 (cm) Tissue and other material debrided: Viable, Non-Viable, Slough, Subcutaneous, Skin: Dermis , Skin: Epidermis, Slough Level: Skin/Subcutaneous Tissue Debridement Description: Excisional Instrument: Curette Bleeding: Moderate Hemostasis Achieved: Pressure End Time: 16:44 Procedural Pain: 0 Post Procedural Pain: 0 Response to Treatment: Procedure was tolerated well Level of Consciousness (Post- Awake and Alert procedure): Post Debridement Measurements of Total Wound Length: (cm) 1.1 Width: (cm) 2.8 Depth: (cm) 1.8 Volume: (cm) 4.354 Character of Wound/Ulcer Post Debridement: Improved Severity of Tissue Post Debridement: Fat layer exposed Post Procedure Diagnosis Same as Pre-procedure Electronic Signature(s) Signed: 09/11/2019 5:12:55 PM By: Baltazar Najjar MD Signed: 09/12/2019 5:14:09 PM By: Yevonne Pax RN Entered By: Baltazar Najjar on 09/11/2019 16:48:17 -------------------------------------------------------------------------------- Debridement Details Patient Name: Date of Service: Cheryl [name]  09/11/2019 3:00 PM Medical Record Number: [id number] Patient Account Number: [id number] Date of Birth/Sex: Treating RN: [birth year] (54 y.o. [name] Primary Care Provider: Heywood Iles Other Clinician: Referring Provider: Treating Provider/Extender: Grayland Jack, [name] Debridement Performed for Assessment: Wound #2 Left,Distal,Plantar Foot Performed By: Physician Maxwell Caul., MD Debridement Type: Debridement Severity of Tissue Pre Debridement: Fat layer exposed Level of Consciousness (Pre-procedure): Awake and Alert Pre-procedure Verification/Time Out Yes - 16:40 Taken: Start Time: 16:40 Pain Control: Lidocaine 5% topical ointment T Area Debrided (L x W): otal 1 (cm) x 1.9 (cm) = 1.9 (cm) Tissue and other material debrided: Viable, Non-Viable, Slough, Subcutaneous, Skin: Dermis , Skin: Epidermis, Slough Level: Skin/Subcutaneous Tissue Debridement Description: Excisional Instrument: Curette Bleeding: Moderate Hemostasis Achieved: Pressure End Time: 16:44 Procedural Pain: 0 Post Procedural Pain: 0 Response to Treatment: Procedure was tolerated well Level of Consciousness (Post- Awake and Alert procedure): Post Debridement Measurements of Total Wound Length: (cm) 1 Width: (cm) 1 Depth: (cm) 0.8 Volume: (cm) 0.628 Character of Wound/Ulcer Post Debridement: Improved Severity of Tissue Post Debridement: Fat layer exposed Post Procedure Diagnosis Same as Pre-procedure Electronic Signature(s) Signed: 09/11/2019 5:12:55 PM By: Baltazar Najjar MD Signed: 09/12/2019 5:14:09 PM By: Yevonne Pax RN Entered By: Baltazar Najjar on 09/11/2019 16:49:49 -------------------------------------------------------------------------------- HPI Details Patient Name: Date of Service: Cheryl [name] 09/11/2019 3:00 PM Medical Record Number: [id number] Patient Account Number: [id number] Date of Birth/Sex: Treating RN: 01-17-1965 (54 y.o. [name] Primary Care Provider: Heywood Iles Other Clinician: Referring Provider: Treating Provider/Extender: Grayland Jack, [name] History of Present Illness HPI Description: ADMISSION 08/30/2019 This is a 54 year old woman with type 2 diabetes and peripheral neuropathy. She developed a blister on the plantar left foot on 7/27. She was seen in the emergency room treated. This did not get any better and she required admission to the hospital from 08/02/2019 through 08/09/2019. She was felt to have cellulitis of the left foot.  She was treated with IV medications/antibiotics discharged on Augmentin however she could not tolerate this and she was changed to doxycycline for 5 days she has completed this. She has advanced home care they are apparently putting Xeroform over the wound. When she left the hospital she was supposed to be putting Santyl over the black areas on the left plantar foot covered with Xeroform. X-rays done in the hospital did not show evidence of osteomyelitis only a superficial plantar ulceration. She had blood cultures but I do not see a wound culture. The patient has a past medical history of a right below-knee amputation by Dr. [name] in 2012 presumably for infection in this area although I am not certain. As noted she is a type II diabetic on oral agents with a recent hemoglobin A1c of 5.5. Other past medical history includes ITP, peripheral edema, chronic kidney disease stage III, hypertension, right below-knee amputation, diastolic heart failure. She tells me she is wheelchair-bound and is nonweightbearing although she does push the wheelchair along with the left foot. She is only using hospital sock on the left foot. ABI in our clinic was 1.06 on the left 9/7; the patient cultured Pseudomonas last week out of the deeper proximal wound. MRI that was ordered showed progressive soft tissue ulceration along the plantar aspect of the foot especially  along the medial arch but also plantar to the first metatarsal phalangeal joint no drainable abscess or foreign body. There was a suspicion for early osteomyelitis in the fifth metatarsal base but she does not have a wound in this area. Also problematically is that where we put her prescription for ciprofloxacin and they did not have the antibiotic and we verified that they are out of ciprofloxacin. We did not receive the message from them to this effect The patient did see Dr. Lilian Kapur of podiatry. The wound was debrided. He prescribed Santyl which I do not think she is picked up either. Lab work including a CBC sedimentation rate and C-reactive protein were ordered. Finally she is using silver alginate she has home health changing the dressing 3 times a week she does not disturb this in between times Electronic Signature(s) Signed: 09/11/2019 5:12:55 PM By: Baltazar Najjar MD Entered By: Baltazar Najjar on 09/11/2019 16:52:33 -------------------------------------------------------------------------------- Physical Exam Details Patient Name: Date of Service: Cheryl [name] 09/11/2019 3:00 PM Medical Record Number: [id number] Patient Account Number: [id number] Date of Birth/Sex: Treating RN: February 17, 1965 (54 y.o. [name] Primary Care Provider: Heywood Iles Other Clinician: Referring Provider: Treating Provider/Extender: Grayland Jack, CRYSTA L Weeks in Treatment: 1 Constitutional Sitting or standing Blood Pressure is within target range for patient.. Pulse regular and within target range for patient.Marland Kitchen Respirations regular, non-labored and within target range.. Temperature is normal and within the target range for the patient.Marland Kitchen Appears in no distress. Respiratory work of breathing is normal. Cardiovascular Pedal pulses are palpable. Psychiatric appears at normal baseline. Notes Wound exam She has 3 open areas. A superficial area on the left first metatarsal head. The  other major wounds are on the plantar midfoot the more distal one seems to have come in a bit. The real problem problem is the proximal 1 which is depth approaching bone but it is not palpable. I think there is undermining away from the bottom part of this wound but it is hard to reproduce as much as last time. Still necrotic debris on the wound surface which I removed with a #5 curette hemostasis with direct pressure. After  debridement the wound was vigorously washed out with Anasept and gauze Electronic Signature(s) Signed: 09/11/2019 5:12:55 PM By: Baltazar Najjar MD Entered By: Baltazar Najjar on 09/11/2019 16:54:13 -------------------------------------------------------------------------------- Physician Orders Details Patient Name: Date of Service: [name], [name] 09/11/2019 3:00 PM Medical Record Number: [id number] Patient Account Number: [id number] Date of Birth/Sex: Treating RN: [birth year] (54 y.o. 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 Dressing Change Frequency Wound #1 Left,Proximal,Plantar Foot Change dressing three times week. Wound #2 Left,Distal,Plantar Foot Change dressing three times week. Wound #3 Left Metatarsal head first Change dressing three times week. Wound Cleansing Clean wound with Wound Cleanser - or normal saline - all wounds Primary Wound Dressing Wound #1 Left,Proximal,Plantar Foot lginate with Silver - lightly pack into undermining/tunneling Calcium  A Wound #2 Left,Distal,Plantar Foot lginate with Silver - lightly pack into undermining/tunneling Calcium A Wound #3 Left Metatarsal head first Calcium Alginate with Silver Secondary Dressing Wound #1 Left,Proximal,Plantar Foot Kerlix/Rolled Gauze Dry Gauze ABD pad Wound #2 Left,Distal,Plantar Foot Kerlix/Rolled Gauze Dry Gauze ABD pad Wound #3 Left Metatarsal head first Kerlix/Rolled Gauze Dry Gauze Off-Loading Open toe surgical shoe to: - left foot Home Health Continue Home Health skilled nursing for wound care. - Advanced Patient Medications llergies: No Known Allergies A Notifications Medication Indication Start End Left foot infection 09/11/2019 Cipro DOSE oral 500 mg tablet - 1 tablet oral bid for 2 weeks Electronic Signature(s) Signed: 09/11/2019 4:56:34 PM By: Baltazar Najjar MD Entered By: Baltazar Najjar on 09/11/2019 16:56:33 -------------------------------------------------------------------------------- Problem List Details Patient Name: Date of Service: Cheryl [name] 09/11/2019 3:00 PM Medical Record Number: [id number] Patient Account Number: [id number] Date of Birth/Sex: Treating RN: September 22, 1965 (54 y.o. [name] Primary Care Provider: Heywood Iles Other Clinician: Referring Provider: Treating Provider/Extender: Grayland Jack, [name] Active Problems ICD-10 Encounter Code Description Active Date MDM Diagnosis E11.621 Type 2 diabetes mellitus with foot ulcer 08/30/2019 No Yes L97.523 Non-pressure chronic ulcer of other part of left foot with necrosis of muscle 08/30/2019 No Yes Z89.511 Acquired absence of right leg below knee 08/30/2019 No Yes B44.967 Other chronic osteomyelitis, left ankle and foot 08/30/2019 No Yes Inactive Problems Resolved Problems Electronic Signature(s) Signed: 09/11/2019 5:12:55 PM By: Baltazar Najjar MD Entered By: Baltazar Najjar on 09/11/2019  16:47:29 -------------------------------------------------------------------------------- Progress Note Details Patient Name: Date of Service: Cheryl [name] 09/11/2019 3:00 PM Medical Record Number: [id number] Patient Account Number: [id number] Date of Birth/Sex: Treating RN: 1965-08-06 (54 y.o. [name] Primary Care Provider: Heywood Iles Other Clinician: Referring Provider: Treating Provider/Extender: Grayland Jack, [name] Subjective History of Present Illness (HPI) ADMISSION 08/30/2019 This is a 54 year old woman with type 2 diabetes and peripheral neuropathy. She developed a blister on the plantar left foot on 7/27. She was seen in the emergency room treated. This did not get any better and she required admission to the hospital from 08/02/2019 through 08/09/2019. She was felt to have cellulitis of the left foot. She was treated with IV medications/antibiotics discharged on Augmentin however she could  not tolerate this and she was changed to doxycycline for 5 days she has completed this. She has advanced home care they are apparently putting Xeroform over the wound. When she left the hospital she was supposed to be putting Santyl over the black areas on the left plantar foot covered with Xeroform. X-rays done in the hospital did not show evidence of osteomyelitis only a superficial plantar ulceration. She had blood cultures but I do not see a wound culture. The patient has a past medical history of a right below-knee amputation by Dr. Victorino DikeHewitt in 2012 presumably for infection in this area although I am not certain. As noted she is a type II diabetic on oral agents with a recent hemoglobin A1c of 5.5. Other past medical history includes ITP, peripheral edema, chronic kidney disease stage III, hypertension, right below-knee amputation, diastolic heart failure. She tells me she is wheelchair-bound and is nonweightbearing although she does push the  wheelchair along with the left foot. She is only using hospital sock on the left foot. ABI in our clinic was 1.06 on the left 9/7; the patient cultured Pseudomonas last week out of the deeper proximal wound. MRI that was ordered showed progressive soft tissue ulceration along the plantar aspect of the foot especially along the medial arch but also plantar to the first metatarsal phalangeal joint no drainable abscess or foreign body. There was a suspicion for early osteomyelitis in the fifth metatarsal base but she does not have a wound in this area. Also problematically is that where we put her prescription for ciprofloxacin and they did not have the antibiotic and we verified that they are out of ciprofloxacin. We did not receive the message from them to this effect The patient did see Dr. Lilian KapurMcDonald of podiatry. The wound was debrided. He prescribed Santyl which I do not think she is picked up either. Lab work including a CBC sedimentation rate and C-reactive protein were ordered. Finally she is using silver alginate she has home health changing the dressing 3 times a week she does not disturb this in between times Objective Constitutional Sitting or standing Blood Pressure is within target range for patient.. Pulse regular and within target range for patient.Marland Kitchen. Respirations regular, non-labored and within target range.. Temperature is normal and within the target range for the patient.Marland Kitchen. Appears in no distress. Vitals Time Taken: 3:33 PM, Height: 67 in, Weight: 200 lbs, BMI: 31.3, Temperature: 97.9 F, Pulse: 72 bpm, Respiratory Rate: 18 breaths/min, Blood Pressure: 126/74 mmHg. Respiratory work of breathing is normal. Cardiovascular Pedal pulses are palpable. Psychiatric appears at normal baseline. General Notes: Wound exam ooShe has 3 open areas. A superficial area on the left first metatarsal head. The other major wounds are on the plantar midfoot the more distal one seems to have come in  a bit. The real problem problem is the proximal 1 which is depth approaching bone but it is not palpable. I think there is undermining away from the bottom part of this wound but it is hard to reproduce as much as last time. Still necrotic debris on the wound surface which I removed with a #5 curette hemostasis with direct pressure. After debridement the wound was vigorously washed out with Anasept and gauze Integumentary (Hair, Skin) Wound #1 status is Open. Original cause of wound was Blister. The wound is located on the Left,Proximal,Plantar Foot. The wound measures 1.1cm length x 2.8cm width x 1.8cm depth; 2.419cm^2 area and 4.354cm^3 volume. There is muscle and Fat Layer (Subcutaneous  Tissue) exposed. There is no tunneling noted, however, there is undermining starting at 8:00 and ending at 9:00 with a maximum distance of 1cm. There is a medium amount of purulent drainage noted. The wound margin is well defined and not attached to the wound base. There is no granulation within the wound bed. There is a large (67-100%) amount of necrotic tissue within the wound bed including Adherent Slough and Necrosis of Muscle. Wound #2 status is Open. Original cause of wound was Blister. The wound is located on the Left,Distal,Plantar Foot. The wound measures 1cm length x 1.9cm width x 0.8cm depth; 1.492cm^2 area and 1.194cm^3 volume. There is tendon and Fat Layer (Subcutaneous Tissue) exposed. Tunneling has been noted at 9:00 with a maximum distance of 1.1cm. Undermining begins at 2:00 and ends at 4:00 with a maximum distance of 0.6cm. There is a medium amount of serosanguineous drainage noted. The wound margin is well defined and not attached to the wound base. There is small (1-33%) pink granulation within the wound bed. There is a large (67-100%) amount of necrotic tissue within the wound bed including Adherent Slough. Wound #3 status is Open. Original cause of wound was Blister. The wound is located on the  Left Metatarsal head first. The wound measures 1.3cm length x 1cm width x 0.2cm depth; 1.021cm^2 area and 0.204cm^3 volume. There is Fat Layer (Subcutaneous Tissue) exposed. There is no tunneling or undermining noted. There is a small amount of serosanguineous drainage noted. The wound margin is well defined and not attached to the wound base. There is medium (34- 66%) pink granulation within the wound bed. There is a medium (34-66%) amount of necrotic tissue within the wound bed including Adherent Slough. 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 There was a Excisional Skin/Subcutaneous Tissue Debridement with a total area of 3.08 sq cm performed by Maxwell Caul., MD. With the following instrument(s): Curette to remove Viable and Non-Viable tissue/material. Material removed includes Subcutaneous Tissue, Slough, Skin: Dermis, and Skin: Epidermis after achieving pain control using Lidocaine 5% topical ointment. No specimens were taken. A time out was conducted at 16:40, prior to the start of the procedure. A Moderate amount of bleeding was controlled with Pressure. The procedure was tolerated well with a pain level of 0 throughout and a pain level of 0 following the procedure. Post Debridement Measurements: 1.1cm length x 2.8cm width x 1.8cm depth; 4.354cm^3 volume. Character of Wound/Ulcer Post Debridement is improved. Severity of Tissue Post Debridement is: Fat layer exposed. Post procedure Diagnosis Wound #1: Same as Pre-Procedure Wound #2 Pre-procedure diagnosis of Wound #2 is a  Diabetic Wound/Ulcer of the Lower Extremity located on the Left,Distal,Plantar Foot .Severity of Tissue Pre Debridement is: Fat layer exposed. There was a Excisional Skin/Subcutaneous Tissue Debridement with a total area of 1.9 sq cm performed by Maxwell Caul., MD. With the following instrument(s): Curette to remove Viable and Non-Viable tissue/material. Material removed includes Subcutaneous Tissue, Slough, Skin: Dermis, and Skin: Epidermis after achieving pain control using Lidocaine 5% topical ointment. No specimens were taken. A time out was conducted at 16:40, prior to the start of the procedure. A Moderate  amount of bleeding was controlled with Pressure. The procedure was tolerated well with a pain level of 0 throughout and a pain level of 0 following the procedure. Post Debridement Measurements: 1cm length x 1cm width x 0.8cm depth; 0.628cm^3 volume. Character of Wound/Ulcer Post Debridement is improved. Severity of Tissue Post Debridement is: Fat layer exposed. Post procedure Diagnosis Wound #2: Same as Pre-Procedure Plan Follow-up Appointments: Return Appointment in 1 week. Dressing Change Frequency: Wound #1 Left,Proximal,Plantar Foot: Change dressing three times week. Wound #2 Left,Distal,Plantar Foot: Change dressing three times week. Wound #3 Left Metatarsal head first: Change dressing three times week. Wound Cleansing: Clean wound with Wound Cleanser - or normal saline - all wounds Primary Wound Dressing: Wound #1 Left,Proximal,Plantar Foot: Calcium Alginate with Silver - lightly pack into undermining/tunneling Wound #2 Left,Distal,Plantar Foot: Calcium Alginate with Silver - lightly pack into undermining/tunneling Wound #3 Left Metatarsal head first: Calcium Alginate with Silver Secondary Dressing: Wound #1 Left,Proximal,Plantar Foot: Kerlix/Rolled Gauze Dry Gauze ABD pad Wound #2 Left,Distal,Plantar Foot: Kerlix/Rolled Gauze Dry Gauze ABD pad Wound #3 Left  Metatarsal head first: Kerlix/Rolled Gauze Dry Gauze Off-Loading: Open toe surgical shoe to: - left foot Home Health: Continue Home Health skilled nursing for wound care. - Advanced The following medication(s) was prescribed: Cipro oral 500 mg tablet 1 tablet oral bid for 2 weeks for Left foot infection starting 09/11/2019 1. Perhaps not quite as bad as this looked last week. 2. I am going to continue with the silver alginate-based dressings while we get her started on antibiotics. Ciprofloxacin to cover the Pseudomonas that was cultured. I put her in for 2 weeks. 3. Based on the MRI I am more reassured that there is no osteomyelitis here. Although they did comment on early osteomyelitis at the base of the fifth metatarsal there is no open wound here. I think this patient had a deep soft tissue infection initially resulting in these wounds. There was no abscess 4. I have warned her not to put a lot of pressure on this area. Fortunately she does not have a prosthesis for the right leg she only uses the left leg to transfer 5. Home health is changing the dressing. 6. Fortunately no obvious arterial issues Electronic Signature(s) Signed: 09/11/2019 5:12:55 PM By: Baltazar Najjar MD Entered By: Baltazar Najjar on 09/11/2019 16:58:45 -------------------------------------------------------------------------------- SuperBill Details Patient Name: Date of Service: Cheryl Dorene Ar 09/11/2019 Medical Record Number: [id number] Patient Account Number: [id number] Date of Birth/Sex: Treating RN: Jan 23, 1965 (54 y.o. [name] Primary Care Provider: Heywood Iles Other Clinician: Referring Provider: Treating Provider/Extender: Grayland Jack, [name] Diagnosis Coding ICD-10 Codes Code Description E11.621 Type 2 diabetes mellitus with foot ulcer L97.523 Non-pressure chronic ulcer of other part of left foot with necrosis of muscle Z89.511 Acquired absence of right  leg below knee M86.672 Other chronic osteomyelitis, left ankle and foot Facility Procedures CPT4 Code: [id number] Description: 11042 - DEB SUBQ TISSUE 20 SQ CM/< ICD-10 Diagnosis Description L97.523 Non-pressure chronic ulcer of other part of left foot with necrosis of muscle Modifier: Quantity: 1 Physician Procedures : CPT4 Code Description Modifier [id number] 11042 - WC PHYS SUBQ TISS 20 SQ CM ICD-10 Diagnosis Description L97.523 Non-pressure chronic ulcer of other part of left foot with necrosis of muscle Quantity: 1 Electronic Signature(s) Signed: 09/11/2019 5:12:55 PM By: Baltazar Najjar MD Entered By: Baltazar Najjar on 09/11/2019 16:58:55

## 2019-09-18 ENCOUNTER — Other Ambulatory Visit: Payer: Self-pay | Admitting: *Deleted

## 2019-09-18 ENCOUNTER — Telehealth: Payer: Self-pay | Admitting: Hematology and Oncology

## 2019-09-18 DIAGNOSIS — D693 Immune thrombocytopenic purpura: Secondary | ICD-10-CM

## 2019-09-18 NOTE — Telephone Encounter (Signed)
Scheduled appt per 9/14 sch msg - pt aware of appt date and time for lab appt .

## 2019-09-18 NOTE — Progress Notes (Signed)
Pt called c/o blood in stool and vaginal bleeding. Request lab appt and injection. Advised pt labs will need to be checked and evaluated by the provider before treatment being done. Message sent for lab scheduling. Pt was not able to determine what time she could be here for lab appt. Will have scheduliing to call and verify

## 2019-09-19 ENCOUNTER — Encounter: Payer: Self-pay | Admitting: *Deleted

## 2019-09-19 ENCOUNTER — Inpatient Hospital Stay: Payer: Medicare Other

## 2019-09-19 ENCOUNTER — Other Ambulatory Visit: Payer: Self-pay

## 2019-09-19 DIAGNOSIS — D693 Immune thrombocytopenic purpura: Secondary | ICD-10-CM

## 2019-09-19 LAB — CBC WITH DIFFERENTIAL (CANCER CENTER ONLY)
Abs Immature Granulocytes: 0.01 10*3/uL (ref 0.00–0.07)
Basophils Absolute: 0 10*3/uL (ref 0.0–0.1)
Basophils Relative: 1 %
Eosinophils Absolute: 0 10*3/uL (ref 0.0–0.5)
Eosinophils Relative: 1 %
HCT: 38.3 % (ref 36.0–46.0)
Hemoglobin: 11.8 g/dL — ABNORMAL LOW (ref 12.0–15.0)
Immature Granulocytes: 0 %
Lymphocytes Relative: 14 %
Lymphs Abs: 0.8 10*3/uL (ref 0.7–4.0)
MCH: 28 pg (ref 26.0–34.0)
MCHC: 30.8 g/dL (ref 30.0–36.0)
MCV: 91 fL (ref 80.0–100.0)
Monocytes Absolute: 0.5 10*3/uL (ref 0.1–1.0)
Monocytes Relative: 9 %
Neutro Abs: 4.3 10*3/uL (ref 1.7–7.7)
Neutrophils Relative %: 75 %
Platelet Count: 92 10*3/uL — ABNORMAL LOW (ref 150–400)
RBC: 4.21 MIL/uL (ref 3.87–5.11)
RDW: 17.9 % — ABNORMAL HIGH (ref 11.5–15.5)
WBC Count: 5.7 10*3/uL (ref 4.0–10.5)
nRBC: 0 % (ref 0.0–0.2)

## 2019-09-19 LAB — CMP (CANCER CENTER ONLY)
ALT: 17 U/L (ref 0–44)
AST: 19 U/L (ref 15–41)
Albumin: 3.6 g/dL (ref 3.5–5.0)
Alkaline Phosphatase: 89 U/L (ref 38–126)
Anion gap: 8 (ref 5–15)
BUN: 32 mg/dL — ABNORMAL HIGH (ref 6–20)
CO2: 24 mmol/L (ref 22–32)
Calcium: 9 mg/dL (ref 8.9–10.3)
Chloride: 107 mmol/L (ref 98–111)
Creatinine: 1.22 mg/dL — ABNORMAL HIGH (ref 0.44–1.00)
GFR, Est AFR Am: 59 mL/min — ABNORMAL LOW (ref >60)
GFR, Estimated: 51 mL/min — ABNORMAL LOW (ref >60)
Glucose, Bld: 103 mg/dL — ABNORMAL HIGH (ref 70–99)
Potassium: 5.3 mmol/L — ABNORMAL HIGH (ref 3.5–5.1)
Sodium: 139 mmol/L (ref 135–145)
Total Bilirubin: 0.3 mg/dL (ref 0.3–1.2)
Total Protein: 7.7 g/dL (ref 6.5–8.1)

## 2019-09-19 LAB — SAMPLE TO BLOOD BANK

## 2019-09-19 NOTE — Progress Notes (Signed)
Pt complaint of rectal and vaginal bleeding.  Pt labs drawn today showing Plt 92 and Hgb 11.8.  Per MD pt does not need blood transfusion or platelet transfusion at this time.  Pt needing to follow up with PCP for further evaluation and treatment of rectal/vaginal bleeding.  Pt verbalized understanding.

## 2019-09-20 ENCOUNTER — Ambulatory Visit: Payer: Medicare Other | Admitting: Podiatry

## 2019-09-20 ENCOUNTER — Encounter (HOSPITAL_BASED_OUTPATIENT_CLINIC_OR_DEPARTMENT_OTHER): Payer: Medicare Other | Admitting: Internal Medicine

## 2019-09-24 ENCOUNTER — Encounter (HOSPITAL_BASED_OUTPATIENT_CLINIC_OR_DEPARTMENT_OTHER): Payer: Medicare Other | Admitting: Internal Medicine

## 2019-09-26 ENCOUNTER — Inpatient Hospital Stay: Payer: Medicare Other

## 2019-09-26 ENCOUNTER — Inpatient Hospital Stay: Payer: Medicare Other | Admitting: Hematology and Oncology

## 2019-09-26 NOTE — Assessment & Plan Note (Deleted)
Relapsing ITP: Patient response to steroids and IVIG but because she is a diabetic we cannot use steroids. IVIG response appeared to only last for a couple of months. Prior IVIG: March 2021, April 2021 Because of her history of heart failure I am also worried about the volume of fluid that she gets with IVIG. Lab review: Platelet count 16 on 05/16/2019 ----------------------------------------------------------------------------------------------------------------------------------------------------------------- Current treatment: Rituxan weekly x4 started 05/23/2019 (patient missed her treatment on 05/30/2019)today is third treatment (patient missed multiple appointments) Repeat IVIG treatment 06/27/2019 Rituxan toxicities: Headaches and body aches after Rituxan. Hospitalization: 06/25/2019-06/29/2019: Nausea and vomiting Hospitalization 08/02/2019-08/09/2019 cellulitis left foot  08/08/2019: Platelets 301, hemoglobin 8.6 09/05/2019: Platelets 110, hemoglobin 11.5 09/18/2020: Platelet count 92, hemoglobin 11.8

## 2019-09-27 ENCOUNTER — Encounter (HOSPITAL_BASED_OUTPATIENT_CLINIC_OR_DEPARTMENT_OTHER): Payer: Medicare Other | Admitting: Internal Medicine

## 2019-09-27 ENCOUNTER — Other Ambulatory Visit: Payer: Self-pay

## 2019-09-27 DIAGNOSIS — E11621 Type 2 diabetes mellitus with foot ulcer: Secondary | ICD-10-CM | POA: Diagnosis not present

## 2019-09-30 NOTE — Progress Notes (Signed)
[name], [name] ([id number]) Visit Report for 09/27/2019 Debridement Details Patient Name: Date of Service: MA MARVIS, BAKKEN 09/27/2019 3:00 PM Medical Record Number: [id number] Patient Account Number: [id number] Date of Birth/Sex: Treating RN: 01-26-1965 (54 y.o. [name] Primary Care Provider: Kym Groom Other Clinician: Referring Provider: Treating Provider/Extender: Tonette Bihari, Dereck Leep Weeks in Treatment: 4 Debridement Performed for Assessment: Wound #1 Left,Proximal,Plantar Foot Performed By: Physician Ricard Dillon., MD Debridement Type: Debridement Severity of Tissue Pre Debridement: Fat layer exposed Level of Consciousness (Pre-procedure): Awake and Alert Pre-procedure Verification/Time Out Yes - 15:55 Taken: Start Time: 15:56 Pain Control: Lidocaine 4% T opical Solution T Area Debrided (L x W): otal 1.5 (cm) x 3 (cm) = 4.5 (cm) Tissue and other material debrided: Viable, Non-Viable, Callus, Slough, Subcutaneous, Skin: Dermis , Fibrin/Exudate, Slough Level: Skin/Subcutaneous Tissue Debridement Description: Excisional Instrument: Blade, Curette, Forceps Bleeding: Moderate Hemostasis Achieved: Silver Nitrate End Time: 16:03 Procedural Pain: 0 Post Procedural Pain: 0 Response to Treatment: Procedure was tolerated well Level of Consciousness (Post- Awake and Alert procedure): Post Debridement Measurements of Total Wound Length: (cm) 1 Width: (cm) 2.5 Depth: (cm) 1.4 Volume: (cm) 2.749 Character of Wound/Ulcer Post Debridement: Improved Severity of Tissue Post Debridement: Fat layer exposed Post Procedure Diagnosis Same as Pre-procedure Electronic Signature(s) Signed: 09/27/2019 4:42:44 PM By: Deon Pilling Signed: 09/30/2019 6:30:39 AM By: Linton Ham MD Entered By: Linton Ham on 09/27/2019 16:40:52 -------------------------------------------------------------------------------- Debridement Details Patient Name: Date of  Service: MA [name]. 09/27/2019 3:00 PM Medical Record Number: [id number] Patient Account Number: [id number] Date of Birth/Sex: Treating RN: 09/08/1965 (54 y.o. [name] Primary Care Provider: Kym Groom Other Clinician: Referring Provider: Treating Provider/Extender: Tonette Bihari, Dereck Leep Weeks in Treatment: 4 Debridement Performed for Assessment: Wound #2 Left,Distal,Plantar Foot Performed By: Physician Ricard Dillon., MD Debridement Type: Debridement Severity of Tissue Pre Debridement: Fat layer exposed Level of Consciousness (Pre-procedure): Awake and Alert Pre-procedure Verification/Time Out Yes - 15:55 Taken: Start Time: 15:56 Pain Control: Lidocaine 4% T opical Solution T Area Debrided (L x W): otal 1.5 (cm) x 2 (cm) = 3 (cm) Tissue and other material debrided: Viable, Non-Viable, Callus, Slough, Subcutaneous, Skin: Dermis , Fibrin/Exudate, Slough Level: Skin/Subcutaneous Tissue Debridement Description: Excisional Instrument: Blade, Curette, Forceps Bleeding: Moderate Hemostasis Achieved: Silver Nitrate End Time: 16:03 Procedural Pain: 0 Post Procedural Pain: 0 Response to Treatment: Procedure was tolerated well Level of Consciousness (Post- Awake and Alert procedure): Post Debridement Measurements of Total Wound Length: (cm) 1 Width: (cm) 1.6 Depth: (cm) 0.6 Volume: (cm) 0.754 Character of Wound/Ulcer Post Debridement: Improved Severity of Tissue Post Debridement: Fat layer exposed Post Procedure Diagnosis Same as Pre-procedure Electronic Signature(s) Signed: 09/27/2019 4:42:44 PM By: Deon Pilling Signed: 09/30/2019 6:30:39 AM By: Linton Ham MD Entered By: Linton Ham on 09/27/2019 16:41:03 -------------------------------------------------------------------------------- Debridement Details Patient Name: Date of Service: MA [name]. 09/27/2019 3:00 PM Medical Record Number: [id number] Patient Account Number:  [id number] Date of Birth/Sex: Treating RN: April 15, 1965 (54 y.o. [name] Primary Care Provider: Kym Groom Other Clinician: Referring Provider: Treating Provider/Extender: Tonette Bihari, Dereck Leep Weeks in Treatment: 4 Debridement Performed for Assessment: Wound #3 Left Metatarsal head first Performed By: Physician Ricard Dillon., MD Debridement Type: Debridement Severity of Tissue Pre Debridement: Fat layer exposed Level of Consciousness (Pre-procedure): Awake and Alert Pre-procedure Verification/Time Out Yes - 15:55 Taken: Start Time: 15:56 Pain Control: Lidocaine 4% T opical Solution T Area Debrided (L x W): otal 1.5 (  cm) x 1.5 (cm) = 2.25 (cm) Tissue and other material debrided: Viable, Non-Viable, Callus, Slough, Subcutaneous, Skin: Dermis , Fibrin/Exudate, Slough Level: Skin/Subcutaneous Tissue Debridement Description: Excisional Instrument: Blade, Curette, Forceps Bleeding: Moderate Hemostasis Achieved: Silver Nitrate End Time: 16:03 Procedural Pain: 0 Post Procedural Pain: 0 Response to Treatment: Procedure was tolerated well Level of Consciousness (Post- Awake and Alert procedure): Post Debridement Measurements of Total Wound Length: (cm) 1.1 Width: (cm) 0.9 Depth: (cm) 0.3 Volume: (cm) 0.233 Character of Wound/Ulcer Post Debridement: Improved Severity of Tissue Post Debridement: Fat layer exposed Post Procedure Diagnosis Same as Pre-procedure Electronic Signature(s) Signed: 09/27/2019 4:42:44 PM By: Deon Pilling Signed: 09/30/2019 6:30:39 AM By: Linton Ham MD Entered By: Linton Ham on 09/27/2019 16:41:13 -------------------------------------------------------------------------------- HPI Details Patient Name: Date of Service: MA [name]. 09/27/2019 3:00 PM Medical Record Number: [id number] Patient Account Number: [id number] Date of Birth/Sex: Treating RN: 24-Jun-1965 (54 y.o. [name] Primary Care Provider:  Kym Groom Other Clinician: Referring Provider: Treating Provider/Extender: Tonette Bihari, Dereck Leep Weeks in Treatment: 4 History of Present Illness HPI Description: ADMISSION 08/30/2019 This is a 54 year old woman with type 2 diabetes and peripheral neuropathy. She developed a blister on the plantar left foot on 7/27. She was seen in the emergency room treated. This did not get any better and she required admission to the hospital from 08/02/2019 through 08/09/2019. She was felt to have cellulitis of the left foot. She was treated with IV medications/antibiotics discharged on Augmentin however she could not tolerate this and she was changed to doxycycline for 5 days she has completed this. She has advanced home care they are apparently putting Xeroform over the wound. When she left the hospital she was supposed to be putting Santyl over the black areas on the left plantar foot covered with Xeroform. X-rays done in the hospital did not show evidence of osteomyelitis only a superficial plantar ulceration. She had blood cultures but I do not see a wound culture. The patient has a past medical history of a right below-knee amputation by Dr. [name] in 2012 presumably for infection in this area although I am not certain. As noted she is a type II diabetic on oral agents with a recent hemoglobin A1c of 5.5. Other past medical history includes ITP, peripheral edema, chronic kidney disease stage III, hypertension, right below-knee amputation, diastolic heart failure. She tells me she is wheelchair-bound and is nonweightbearing although she does push the wheelchair along with the left foot. She is only using hospital sock on the left foot. ABI in our clinic was 1.06 on the left 9/7; the patient cultured Pseudomonas last week out of the deeper proximal wound. MRI that was ordered showed progressive soft tissue ulceration along the plantar aspect of the foot especially along the medial arch but also  plantar to the first metatarsal phalangeal joint no drainable abscess or foreign body. There was a suspicion for early osteomyelitis in the fifth metatarsal base but she does not have a wound in this area. Also problematically is that where we put her prescription for ciprofloxacin and they did not have the antibiotic and we verified that they are out of ciprofloxacin. We did not receive the message from them to this effect The patient did see Dr. [name] of podiatry. The wound was debrided. He prescribed Santyl which I do not think she is picked up either. Lab work including a CBC sedimentation rate and C-reactive protein were ordered. Finally she is using silver alginate she has  home health changing the dressing 3 times a week she does not disturb this in between times 9/23; we have not seen this lady in 2-1/2 weeks largely because of transportation issues. She still has 3 wound areas a deep area in the midfoot and area just above this and then an area on the first met head. She has been using silver alginate on the wound. They are trying to use home health however they are trying to teach her how to do this at be been very difficult for this patient to do so. In discussing things with her she uses this foot to propel her wheelchair I have asked her to stop doing this and to use her arms. She states that she is only moving around in her house. We will need to have a look at her lab work that I quoted from the last time she was here that was ordered by Dr. [name] of podiatry. By our MRI she does not have osteomyelitis in any of the wounded areas. Electronic Signature(s) Signed: 09/30/2019 6:30:39 AM By: Linton Ham MD Entered By: Linton Ham on 09/27/2019 16:43:43 -------------------------------------------------------------------------------- Physical Exam Details Patient Name: Date of Service: MA SHANDRIA, CLINCH 09/27/2019 3:00 PM Medical Record Number: [id number] Patient Account  Number: [id number] Date of Birth/Sex: Treating RN: 1965-11-12 (54 y.o. [name] Primary Care Provider: Kym Groom Other Clinician: Referring Provider: Treating Provider/Extender: Tonette Bihari, CRYSTA L Weeks in Treatment: 4 Notes Wound exam 3 open areas. The superficial area on the left first metatarsal head this was debrided with a #5 curette to remove hyper granulated tissue and callus The major wounds in the mid plantar foot including the more proximal wound is surrounded and thick callus which I removed with a #10 blade and pickups I completely necrotic wound surface although this does not probe to bone The area slightly more distal here is a smaller version of the same thing with thick surrounding callus necrotic debris on the wound surface although it is not nearly as deep as the more proximal wound. Electronic Signature(s) Signed: 09/30/2019 6:30:39 AM By: Linton Ham MD Entered By: Linton Ham on 09/27/2019 16:45:10 -------------------------------------------------------------------------------- Physician Orders Details Patient Name: Date of Service: MA LEONDRA, CULLIN 09/27/2019 3:00 PM Medical Record Number: [id number] Patient Account Number: [id number] Date of Birth/Sex: Treating RN: Jan 11, 1965 (54 y.o. [name], Meta.Reding Primary Care Provider: Kym Groom Other Clinician: Referring Provider: Treating Provider/Extender: Tonette Bihari, Dereck Leep Weeks in Treatment: 4 Verbal / Phone Orders: No Diagnosis Coding ICD-10 Coding Code Description E11.621 Type 2 diabetes mellitus with foot ulcer L97.523 Non-pressure chronic ulcer of other part of left foot with necrosis of muscle Z89.511 Acquired absence of right leg below knee M86.672 Other chronic osteomyelitis, left ankle and foot Follow-up Appointments Return Appointment in 2 weeks. Dressing Change Frequency Other: - Twice a week home health. Wound Cleansing Clean wound with Wound  Cleanser - or normal saline - all wounds Primary Wound Dressing Wound #1 Left,Proximal,Plantar Foot Silver Collagen - moisten with hydrogel or KY Jelly. Wound #2 Left,Distal,Plantar Foot Silver Collagen - moisten with hydrogel or KY Jelly. Wound #3 Left Metatarsal head first Silver Collagen - moisten with hydrogel or KY Jelly. Secondary Dressing Kerlix/Rolled Gauze Dry Gauze ABD pad Edema Control Avoid standing for long periods of time Elevate legs to the level of the heart or above for 30 minutes daily and/or when sitting, a frequency of: - throughout the day. Off-Loading Open toe surgical shoe to: -  left foot. ensure no pressure to bottom of left foot. use arms to push wheelchair around not your foot. [facility] skilled nursing for wound care. - [facility] Electronic Signature(s) Signed: 09/27/2019 4:42:44 PM By: Deon Pilling Signed: 09/30/2019 6:30:39 AM By: Linton Ham MD Entered By: Deon Pilling on 09/27/2019 16:08:54 -------------------------------------------------------------------------------- Problem List Details Patient Name: Date of Service: MA RHIANON, ZABAWA 09/27/2019 3:00 PM Medical Record Number: [id number] Patient Account Number: [id number] Date of Birth/Sex: Treating RN: 1965/06/01 (54 y.o. [name] Primary Care Provider: Kym Groom Other Clinician: Referring Provider: Treating Provider/Extender: Tonette Bihari, Dereck Leep Weeks in Treatment: 4 Active Problems ICD-10 Encounter Code Description Active Date MDM Diagnosis E11.621 Type 2 diabetes mellitus with foot ulcer 08/30/2019 No Yes L97.523 Non-pressure chronic ulcer of other part of left foot with necrosis of muscle 08/30/2019 No Yes Z89.511 Acquired absence of right leg below knee 08/30/2019 No Yes M86.672 Other chronic osteomyelitis, left ankle and foot 08/30/2019 No Yes Inactive Problems Resolved Problems Electronic Signature(s) Signed: 09/30/2019 6:30:39 AM  By: Linton Ham MD Entered By: Linton Ham on 09/27/2019 16:37:55 -------------------------------------------------------------------------------- Progress Note Details Patient Name: Date of Service: MA [name]. 09/27/2019 3:00 PM Medical Record Number: [id number] Patient Account Number: [id number] Date of Birth/Sex: Treating RN: February 03, 1965 (54 y.o. [name] Primary Care Provider: Kym Groom Other Clinician: Referring Provider: Treating Provider/Extender: Tonette Bihari, Dereck Leep Weeks in Treatment: 4 Subjective History of Present Illness (HPI) ADMISSION 08/30/2019 This is a 54 year old woman with type 2 diabetes and peripheral neuropathy. She developed a blister on the plantar left foot on 7/27. She was seen in the emergency room treated. This did not get any better and she required admission to the hospital from 08/02/2019 through 08/09/2019. She was felt to have cellulitis of the left foot. She was treated with IV medications/antibiotics discharged on Augmentin however she could not tolerate this and she was changed to doxycycline for 5 days she has completed this. She has advanced home care they are apparently putting Xeroform over the wound. When she left the hospital she was supposed to be putting Santyl over the black areas on the left plantar foot covered with Xeroform. X-rays done in the hospital did not show evidence of osteomyelitis only a superficial plantar ulceration. She had blood cultures but I do not see a wound culture. The patient has a past medical history of a right below-knee amputation by Dr. [name] in 2012 presumably for infection in this area although I am not certain. As noted she is a type II diabetic on oral agents with a recent hemoglobin A1c of 5.5. Other past medical history includes ITP, peripheral edema, chronic kidney disease stage III, hypertension, right below-knee amputation, diastolic heart failure. She tells me she is  wheelchair-bound and is nonweightbearing although she does push the wheelchair along with the left foot. She is only using hospital sock on the left foot. ABI in our clinic was 1.06 on the left 9/7; the patient cultured Pseudomonas last week out of the deeper proximal wound. MRI that was ordered showed progressive soft tissue ulceration along the plantar aspect of the foot especially along the medial arch but also plantar to the first metatarsal phalangeal joint no drainable abscess or foreign body. There was a suspicion for early osteomyelitis in the fifth metatarsal base but she does not have a wound in this area. Also problematically is that where we put her prescription for ciprofloxacin and they did not  have the antibiotic and we verified that they are out of ciprofloxacin. We did not receive the message from them to this effect The patient did see Dr. [name] of podiatry. The wound was debrided. He prescribed Santyl which I do not think she is picked up either. Lab work including a CBC sedimentation rate and C-reactive protein were ordered. Finally she is using silver alginate she has home health changing the dressing 3 times a week she does not disturb this in between times 9/23; we have not seen this lady in 2-1/2 weeks largely because of transportation issues. She still has 3 wound areas a deep area in the midfoot and area just above this and then an area on the first met head. She has been using silver alginate on the wound. They are trying to use home health however they are trying to teach her how to do this at be been very difficult for this patient to do so. In discussing things with her she uses this foot to propel her wheelchair I have asked her to stop doing this and to use her arms. She states that she is only moving around in her house. We will need to have a look at her lab work that I quoted from the last time she was here that was ordered by Dr. [name] of podiatry. By our MRI  she does not have osteomyelitis in any of the wounded areas. Objective Constitutional Vitals Time Taken: 3:11 PM, Height: 67 in, Weight: 200 lbs, BMI: 31.3, Temperature: 98.4 F, Pulse: 74 bpm, Respiratory Rate: 18 breaths/min, Blood Pressure: 119/70 mmHg. Integumentary (Hair, Skin) Wound #1 status is Open. Original cause of wound was Blister. The wound is located on the Left,Proximal,Plantar Foot. The wound measures 1cm length x 2.5cm width x 1.4cm depth; 1.963cm^2 area and 2.749cm^3 volume. There is muscle and Fat Layer (Subcutaneous Tissue) exposed. There is no tunneling or undermining noted. There is a medium amount of purulent drainage noted. The wound margin is well defined and not attached to the wound base. There is no granulation within the wound bed. There is a large (67-100%) amount of necrotic tissue within the wound bed including Adherent Slough and Necrosis of Muscle. Wound #2 status is Open. Original cause of wound was Blister. The wound is located on the [place]. The wound measures 1cm length x 1.6cm width x 0.6cm depth; 1.257cm^2 area and 0.754cm^3 volume. There is tendon and Fat Layer (Subcutaneous Tissue) exposed. There is no tunneling or undermining noted. There is a medium amount of serosanguineous drainage noted. The wound margin is well defined and not attached to the wound base. There is small (1-33%) pink granulation within the wound bed. There is a large (67-100%) amount of necrotic tissue within the wound bed including Adherent Slough. Wound #3 status is Open. Original cause of wound was Blister. The wound is located on the Left Metatarsal head first. The wound measures 1.1cm length x 0.9cm width x 0.3cm depth; 0.778cm^2 area and 0.233cm^3 volume. There is Fat Layer (Subcutaneous Tissue) exposed. There is no tunneling or undermining noted. There is a small amount of serosanguineous drainage noted. The wound margin is well defined and not attached to the  wound base. There is medium (34- 66%) pink granulation within the wound bed. There is a medium (34-66%) amount of necrotic tissue within the wound bed including Adherent Slough. Assessment Active Problems ICD-10 Type 2 diabetes mellitus with foot ulcer Non-pressure chronic ulcer of other part of left foot with necrosis  of muscle Acquired absence of right leg below knee Other chronic osteomyelitis, left ankle and foot Procedures Wound #1 Pre-procedure diagnosis of Wound #1 is a Diabetic Wound/Ulcer of the Lower Extremity located on the Left,Proximal,Plantar Foot .Severity of Tissue Pre Debridement is: Fat layer exposed. There was a Excisional Skin/Subcutaneous Tissue Debridement with a total area of 4.5 sq cm performed by Ricard Dillon., MD. With the following instrument(s): Blade, Curette, and Forceps to remove Viable and Non-Viable tissue/material. Material removed includes Callus, Subcutaneous Tissue, Slough, Skin: Dermis, and Fibrin/Exudate after achieving pain control using Lidocaine 4% T opical Solution. A time out was conducted at 15:55, prior to the start of the procedure. A Moderate amount of bleeding was controlled with Silver Nitrate. The procedure was tolerated well with a pain level of 0 throughout and a pain level of 0 following the procedure. Post Debridement Measurements: 1cm length x 2.5cm width x 1.4cm depth; 2.749cm^3 volume. Character of Wound/Ulcer Post Debridement is improved. Severity of Tissue Post Debridement is: Fat layer exposed. Post procedure Diagnosis Wound #1: Same as Pre-Procedure Wound #2 Pre-procedure diagnosis of Wound #2 is a Diabetic Wound/Ulcer of the Lower Extremity located on the Left,Distal,Plantar Foot .Severity of Tissue Pre Debridement is: Fat layer exposed. There was a Excisional Skin/Subcutaneous Tissue Debridement with a total area of 3 sq cm performed by Ricard Dillon., MD. With the following instrument(s): Blade, Curette, and Forceps to  remove Viable and Non-Viable tissue/material. Material removed includes Callus, Subcutaneous Tissue, Slough, Skin: Dermis, and Fibrin/Exudate after achieving pain control using Lidocaine 4% T opical Solution. A time out was conducted at 15:55, prior to the start of the procedure. A Moderate amount of bleeding was controlled with Silver Nitrate. The procedure was tolerated well with a pain level of 0 throughout and a pain level of 0 following the procedure. Post Debridement Measurements: 1cm length x 1.6cm width x 0.6cm depth; 0.754cm^3 volume. Character of Wound/Ulcer Post Debridement is improved. Severity of Tissue Post Debridement is: Fat layer exposed. Post procedure Diagnosis Wound #2: Same as Pre-Procedure Wound #3 Pre-procedure diagnosis of Wound #3 is a Diabetic Wound/Ulcer of the Lower Extremity located on the Left Metatarsal head first .Severity of Tissue Pre Debridement is: Fat layer exposed. There was a Excisional Skin/Subcutaneous Tissue Debridement with a total area of 2.25 sq cm performed by Ricard Dillon., MD. With the following instrument(s): Blade, Curette, and Forceps to remove Viable and Non-Viable tissue/material. Material removed includes Callus, Subcutaneous Tissue, Slough, Skin: Dermis, and Fibrin/Exudate after achieving pain control using Lidocaine 4% T opical Solution. A time out was conducted at 15:55, prior to the start of the procedure. A Moderate amount of bleeding was controlled with Silver Nitrate. The procedure was tolerated well with a pain level of 0 throughout and a pain level of 0 following the procedure. Post Debridement Measurements: 1.1cm length x 0.9cm width x 0.3cm depth; 0.233cm^3 volume. Character of Wound/Ulcer Post Debridement is improved. Severity of Tissue Post Debridement is: Fat layer exposed. Post procedure Diagnosis Wound #3: Same as Pre-Procedure Plan Follow-up Appointments: Return Appointment in 2 weeks. Dressing Change Frequency: Other: -  Twice a week home health. Wound Cleansing: Clean wound with Wound Cleanser - or normal saline - all wounds Primary Wound Dressing: Wound #1 Left,Proximal,Plantar Foot: Silver Collagen - moisten with hydrogel or KY Jelly. Wound #2 Left,Distal,Plantar Foot: Silver Collagen - moisten with hydrogel or KY Jelly. Wound #3 Left Metatarsal head first: Silver Collagen - moisten with hydrogel or KY Jelly. Secondary Dressing:  Kerlix/Rolled Gauze Dry Gauze ABD pad Edema Control: Avoid standing for long periods of time Elevate legs to the level of the heart or above for 30 minutes daily and/or when sitting, a frequency of: - throughout the day. Off-Loading: Open toe surgical shoe to: - left foot. ensure no pressure to bottom of left foot. use arms to push wheelchair around not your foot. Home Health: [facility] skilled nursing for wound care. - [facility] 1. I changed the primary dressing to silver collagen 2. The patient has difficulty with transportation getting into clinic on any regular basis. 3. She does not have osteomyelitis by her recent MRI but I would like to check lab work that was ordered by podiatry including a CBC, sedimentation rate and C-reactive protein 4. No overt infection 5. Ultimately I think this patient is going to need more aggressive offloading of these areas I have asked her to stop using her foot to propel her wheelchair. She expressed understanding. 6. The plan here would be to consider trying to get the wounds to a point where we could use an advanced treatment product to try and get these areas to granulate such as Dermagraft however they are not nearly ready for this. The callus around the wound suggest too much pressure. Electronic Signature(s) Signed: 09/30/2019 6:30:39 AM By: Linton Ham MD Entered By: Linton Ham on 09/27/2019 16:47:17 -------------------------------------------------------------------------------- SuperBill Details Patient  Name: Date of Service: MA UNITY, LUEPKE 09/27/2019 Medical Record Number: [id number] Patient Account Number: [id number] Date of Birth/Sex: Treating RN: 01-03-1966 (54 y.o. 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 M86.672 Other chronic osteomyelitis, left ankle and foot Facility Procedures CPT4 Code: 84128208 Description: 13887 - DEB SUBQ TISSUE 20 SQ CM/< ICD-10 Diagnosis Description L97.523 Non-pressure chronic ulcer of other part of left foot with necrosis of muscle E11.621 Type 2 diabetes mellitus with foot ulcer Modifier: Quantity: 1 Physician Procedures : CPT4 Code Description Modifier [id number] - WC PHYS SUBQ TISS 20 SQ CM ICD-10 Diagnosis Description L97.523 Non-pressure chronic ulcer of other part of left foot with necrosis of muscle E11.621 Type 2 diabetes mellitus with foot ulcer Quantity: 1 Electronic Signature(s) Signed: 09/30/2019 6:30:39 AM By: Linton Ham MD Previous Signature: 09/27/2019 4:42:44 PM Version By: Deon Pilling Entered By: Linton Ham on 09/27/2019 16:47:30

## 2019-09-30 NOTE — Progress Notes (Signed)
[name], [name] ([id number]) Visit Report for 09/27/2019 Arrival Information Details Patient Name: Date of Service: MA TAKIESHA, MCDEVITT 09/27/2019 3:00 PM Medical Record Number: [id number] Patient Account Number: [id number] Date of Birth/Sex: Treating RN: 19-Jun-1965 (54 y.o. [name], Meta.Reding Primary Care Danitza Schoenfeldt: Kym Groom Other Clinician: Referring Kaeden Mester: Treating Arnet Hofferber/Extender: Tonette Bihari, Dereck Leep Weeks in Treatment: 4 Visit Information History Since Last Visit Added or deleted any medications: No Patient Arrived: Wheel Chair Any new allergies or adverse reactions: No Arrival Time: 15:11 Had a fall or experienced change in No Accompanied By: self activities of daily living that may affect Transfer Assistance: None risk of falls: Patient Identification Verified: Yes Signs or symptoms of abuse/neglect since last visito No Secondary Verification Process Completed: Yes Hospitalized since last visit: No Implantable device outside of the clinic excluding No cellular tissue based products placed in the center since last visit: Has Dressing in Place as Prescribed: Yes Pain Present Now: Yes Electronic Signature(s) Signed: 09/28/2019 3:01:17 PM By: Sandre Kitty Entered By: Sandre Kitty on 09/27/2019 15:12:27 -------------------------------------------------------------------------------- Encounter Discharge Information Details Patient Name: Date of Service: MA [name]. 09/27/2019 3:00 PM Medical Record Number: [id number] Patient Account Number: [id number] Date of Birth/Sex: Treating RN: 10-Dec-1965 (54 y.o. [name] Primary Care Whitman Meinhardt: Kym Groom Other Clinician: Referring Espiridion Supinski: Treating Glynna Failla/Extender: Tonette Bihari, Dereck Leep Weeks in Treatment: 4 Encounter Discharge Information Items Post Procedure Vitals Discharge Condition: Stable Temperature (F): 98.4 Ambulatory Status: Wheelchair Pulse (bpm):  74 Discharge Destination: Home Respiratory Rate (breaths/min): 18 Transportation: Other Blood Pressure (mmHg): 119/70 Accompanied By: self Schedule Follow-up Appointment: Yes Clinical Summary of Care: Patient Declined Notes transportation service Electronic Signature(s) Signed: 09/27/2019 4:54:27 PM By: Baruch Gouty RN, [name] Entered By: Baruch Gouty on 09/27/2019 16:43:39 -------------------------------------------------------------------------------- Lower Extremity Assessment Details Patient Name: Date of Service: MA SAIRAH, KNOBLOCH 09/27/2019 3:00 PM Medical Record Number: [id number] Patient Account Number: [id number] Date of Birth/Sex: Treating RN: 06-Jul-1965 (54 y.o. Nancy Fetter Primary Care Jacole Capley: Kym Groom Other Clinician: Referring Berma Harts: Treating Analeah Brame/Extender: Tonette Bihari, CRYSTA L Weeks in Treatment: 4 Edema Assessment Assessed: [Left: No] [Right: No] Edema: [Left: Ye] [Right: s] Calf Left: Right: Point of Measurement: cm From Medial Instep 33.5 cm cm Ankle Left: Right: Point of Measurement: cm From Medial Instep 23 cm cm Vascular Assessment Pulses: Dorsalis Pedis Palpable: [Left:Yes] Electronic Signature(s) Signed: 09/27/2019 4:54:50 PM By: Levan Hurst RN, [name] Entered By: Levan Hurst on 09/27/2019 15:30:48 -------------------------------------------------------------------------------- Multi Wound Chart Details Patient Name: Date of Service: MA [name]. 09/27/2019 3:00 PM Medical Record Number: [id number] Patient Account Number: [id number] Date of Birth/Sex: Treating RN: 12/11/1965 (54 y.o. [name], Meta.Reding Primary Care Shelbie Franken: Kym Groom Other Clinician: Referring Leif Loflin: Treating Jabron Weese/Extender: Tonette Bihari, CRYSTA L Weeks in Treatment: 4 Vital Signs Height(in): 67 Pulse(bpm): 84 Weight(lbs): 200 Blood Pressure(mmHg): 119/70 Body Mass Index(BMI): 31 Temperature(F):  98.4 Respiratory Rate(breaths/min): 18 Photos: [1:No Photos Left, Proximal, Plantar Foot] [2:No Photos Left, Distal, Plantar Foot] [3:No Photos Left Metatarsal head first] Wound Location: [1:Blister] [2:Blister] [3:Blister] Wounding Event: [1:Diabetic Wound/Ulcer of the Lower] [2:Diabetic Wound/Ulcer of the Lower] [3:Diabetic Wound/Ulcer of the Lower] Primary Etiology: [1:Extremity Cataracts, Anemia, Congestive Heart] [2:Extremity Cataracts, Anemia, Congestive Heart] [3:Extremity Cataracts, Anemia, Congestive Heart] Comorbid History: [1:Failure, Hypertension, Type II Diabetes, Osteomyelitis, Neuropathy 08/02/2019] [2:Failure, Hypertension, Type II Diabetes, Osteomyelitis, Neuropathy 08/02/2019] [3:Failure, Hypertension, Type II Diabetes, Osteomyelitis, Neuropathy  08/02/2019] Date Acquired: [1:4] [2:4] [3:4] Weeks of Treatment: [1:Open] [2:Open] [3:Open]  Wound Status: [1:1x2.5x1.4] [2:1x1.6x0.6] [3:1.1x0.9x0.3] Measurements L x W x D (cm) [1:1.963] [2:1.257] [3:0.778] A (cm) : rea [1:2.749] [2:0.754] [3:0.233] Volume (cm) : [1:55.40%] [2:46.60%] [3:-3.20%] % Reduction in A rea: [1:74.00%] [2:68.00%] [3:55.90%] % Reduction in Volume: [1:Grade 2] [2:Grade 2] [3:Grade 3] Classification: [1:Medium] [2:Medium] [3:Small] Exudate A mount: [1:Purulent] [2:Serosanguineous] [3:Serosanguineous] Exudate Type: [1:yellow, brown, green] [2:red, brown] [3:red, brown] Exudate Color: [1:Well defined, not attached] [2:Well defined, not attached] [3:Well defined, not attached] Wound Margin: [1:None Present (0%)] [2:Small (1-33%)] [3:Medium (34-66%)] Granulation A mount: [1:N/A] [2:Pink] [3:Pink] Granulation Quality: [1:Large (67-100%)] [2:Large (67-100%)] [3:Medium (34-66%)] Necrotic A mount: [1:Fat Layer (Subcutaneous Tissue): Yes Fat Layer (Subcutaneous Tissue): Yes Fat Layer (Subcutaneous Tissue): Yes] Exposed Structures: [1:Muscle: Yes Fascia: No Tendon: No Joint: No Bone: No None] [2:Tendon: Yes Fascia:  No Muscle: No Joint: No Bone: No None] [3:Fascia: No Tendon: No Muscle: No Joint: No Bone: No None] Epithelialization: [1:Debridement - Excisional] [2:Debridement - Excisional] [3:Debridement - Excisional] Debridement: Pre-procedure Verification/Time Out 15:55 [2:15:55] [3:15:55] Taken: [1:Lidocaine 4% T opical Solution] [2:Lidocaine 4% T opical Solution] [3:Lidocaine 4% T opical Solution] Pain Control: [1:Callus, Subcutaneous, Slough] [2:Callus, Subcutaneous, Slough] [3:Callus, Subcutaneous, Slough] Tissue Debrided: [1:Skin/Subcutaneous Tissue] [2:Skin/Subcutaneous Tissue] [3:Skin/Subcutaneous Tissue] Level: [1:4.5] [2:3] [3:2.25] Debridement A (sq cm): [1:rea Blade, Curette, Forceps] [2:Blade, Curette, Forceps] [3:Blade, Curette, Forceps] Instrument: [1:Moderate] [2:Moderate] [3:Moderate] Bleeding: [1:Silver Nitrate] [2:Silver Nitrate] [3:Silver Nitrate] Hemostasis A chieved: [1:0] [2:0] [3:0] Procedural Pain: [1:0] [2:0] [3:0] Post Procedural Pain: [1:Procedure was tolerated well] [2:Procedure was tolerated well] [3:Procedure was tolerated well] Debridement Treatment Response: [1:1x2.5x1.4] [2:1x1.6x0.6] [3:1.1x0.9x0.3] Post Debridement Measurements L x W x D (cm) [1:2.749] [2:0.754] [3:0.233] Post Debridement Volume: (cm) [1:Debridement] [2:Debridement] [3:Debridement] Treatment Notes Electronic Signature(s) Signed: 09/27/2019 4:42:44 PM By: Deon Pilling Signed: 09/30/2019 6:30:39 AM By: Linton Ham MD Entered By: Linton Ham on 09/27/2019 16:40:38 -------------------------------------------------------------------------------- Multi-Disciplinary Care Plan Details Patient Name: Date of Service: MA [name]. 09/27/2019 3:00 PM Medical Record Number: [id number] Patient Account Number: [id number] Date of Birth/Sex: Treating RN: 1965-08-18 (54 y.o. [name], [name] Primary Care Unita Detamore: Kym Groom Other Clinician: Referring Kendalynn Wideman: Treating Mahmud Keithly/Extender:  Tonette Bihari, Dereck Leep Weeks in Treatment: 4 Active Inactive Abuse / Safety / Falls / Self Care Management Nursing Diagnoses: Potential for falls Potential for injury related to falls Goals: Patient will not experience any injury related to falls Date Initiated: 08/30/2019 Target Resolution Date: 11/02/2019 Goal Status: Active Patient/caregiver will verbalize/demonstrate measures taken to prevent injury and/or falls Date Initiated: 08/30/2019 Date Inactivated: 09/27/2019 Target Resolution Date: 09/28/2019 Goal Status: Met Interventions: Assess Activities of Daily Living upon admission and as needed Assess fall risk on admission and as needed Assess: immobility, friction, shearing, incontinence upon admission and as needed Assess impairment of mobility on admission and as needed per policy Assess personal safety and home safety (as indicated) on admission and as needed Assess self care needs on admission and as needed Provide education on fall prevention Provide education on personal and home safety Notes: Nutrition Nursing Diagnoses: Impaired glucose control: actual or potential Potential for alteratiion in Nutrition/Potential for imbalanced nutrition Goals: Patient/caregiver agrees to and verbalizes understanding of need to use nutritional supplements and/or vitamins as prescribed Date Initiated: 08/30/2019 Target Resolution Date: 10/05/2019 Goal Status: Active Patient/caregiver will maintain therapeutic glucose control Date Initiated: 08/30/2019 Target Resolution Date: 10/05/2019 Goal Status: Active Interventions: Assess HgA1c results as ordered upon admission and as needed Assess patient nutrition upon admission and as needed per policy Provide education on elevated blood sugars and  impact on wound healing Provide education on nutrition Treatment Activities: Education provided on Nutrition : 09/11/2019 Notes: Wound/Skin Impairment Nursing Diagnoses: Impaired tissue  integrity Knowledge deficit related to ulceration/compromised skin integrity Goals: Patient/caregiver will verbalize understanding of skin care regimen Date Initiated: 08/30/2019 Target Resolution Date: 10/05/2019 Goal Status: Active Interventions: Assess patient/caregiver ability to obtain necessary supplies Assess patient/caregiver ability to perform ulcer/skin care regimen upon admission and as needed Assess ulceration(s) every visit Provide education on ulcer and skin care Notes: Electronic Signature(s) Signed: 09/27/2019 4:42:44 PM By: Deon Pilling Entered By: Deon Pilling on 09/27/2019 15:36:02 -------------------------------------------------------------------------------- Pain Assessment Details Patient Name: Date of Service: AERILYNN, GOIN 09/27/2019 3:00 PM Medical Record Number: [id number] Patient Account Number: [id number] Date of Birth/Sex: Treating RN: 1965/06/08 (54 y.o. [name] Primary Care Linsey Hirota: Kym Groom Other Clinician: Referring Latoyna Hird: Treating Jhanae Jaskowiak/Extender: Tonette Bihari, Dereck Leep Weeks in Treatment: 4 Active Problems Location of Pain Severity and Description of Pain Patient Has Paino Yes Site Locations Rate the pain. Current Pain Level: 4 Pain Management and Medication Current Pain Management: Electronic Signature(s) Signed: 09/27/2019 4:42:44 PM By: Deon Pilling Signed: 09/28/2019 3:01:17 PM By: Sandre Kitty Entered By: Sandre Kitty on 09/27/2019 15:12:20 -------------------------------------------------------------------------------- Patient/Caregiver Education Details Patient Name: Date of Service: MA CARLO, LORSON 9/23/2021andnbsp3:00 PM Medical Record Number: [id number] Patient Account Number: [id number] Date of Birth/Gender: Treating RN: [birth year] (54 y.o. [name] Primary Care Physician: Kym Groom Other Clinician: Referring Physician: Treating Physician/Extender: Eppie Gibson in Treatment: 4 Education Assessment Education Provided To: Patient Education Topics Provided Elevated Blood Sugar/ Impact on Healing: Handouts: Elevated Blood Sugars: How Do They Affect Wound Healing Methods: Explain/Verbal Responses: Reinforcements needed Electronic Signature(s) Signed: 09/27/2019 4:42:44 PM By: Deon Pilling Entered By: Deon Pilling on 09/27/2019 15:36:16 -------------------------------------------------------------------------------- Wound Assessment Details Patient Name: Date of Service: DAREN, DOSWELL 09/27/2019 3:00 PM Medical Record Number: [id number] Patient Account Number: [id number] Date of Birth/Sex: Treating RN: 08/29/1965 (54 y.o. [name], Meta.Reding Primary Care Kashonda Sarkisyan: Kym Groom Other Clinician: Referring Reigna Ruperto: Treating Arbor Leer/Extender: Tonette Bihari, CRYSTA L Weeks in Treatment: 4 Wound Status Wound Number: 1 Primary Diabetic Wound/Ulcer of the Lower Extremity Etiology: Wound Location: Left, Proximal, Plantar Foot Wound Open Wounding Event: Blister Status: Date Acquired: 08/02/2019 Comorbid Cataracts, Anemia, Congestive Heart Failure, Hypertension, Type Weeks Of Treatment: 4 History: II Diabetes, Osteomyelitis, Neuropathy Clustered Wound: No Photos Photo Uploaded By: Mikeal Hawthorne on 09/28/2019 10:29:53 Wound Measurements Length: (cm) 1 Width: (cm) 2.5 Depth: (cm) 1.4 Area: (cm) 1.963 Volume: (cm) 2.749 % Reduction in Area: 55.4% % Reduction in Volume: 74% Epithelialization: None Tunneling: No Undermining: No Wound Description Classification: Grade 2 Wound Margin: Well defined, not attached Exudate Amount: Medium Exudate Type: Purulent Exudate Color: yellow, brown, green Foul Odor After Cleansing: No Slough/Fibrino Yes Wound Bed Granulation Amount: None Present (0%) Exposed Structure Necrotic Amount: Large (67-100%) Fascia Exposed: No Necrotic Quality: Adherent  Slough Fat Layer (Subcutaneous Tissue) Exposed: Yes Tendon Exposed: No Muscle Exposed: Yes Necrosis of Muscle: Yes Joint Exposed: No Bone Exposed: No Treatment Notes Wound #1 (Left, Proximal, Plantar Foot) 3. Primary Dressing Applied Collegen AG Hydrogel or K-Y Jelly 4. Secondary Dressing ABD Pad Dry Gauze Roll Gauze 7. Footwear/Offloading device applied Surgical shoe Electronic Signature(s) Signed: 09/27/2019 4:42:44 PM By: Deon Pilling Signed: 09/27/2019 4:54:50 PM By: Levan Hurst RN, [name] Entered By: Levan Hurst on 09/27/2019 15:31:32 -------------------------------------------------------------------------------- Wound Assessment Details Patient Name: Date of Service: [name], Ilee L.  09/27/2019 3:00 PM Medical Record Number: [id number] Patient Account Number: [id number] Date of Birth/Sex: Treating RN: 01-Feb-1965 (54 y.o. [name], Meta.Reding Primary Care Maecie Sevcik: Kym Groom Other Clinician: Referring Nesiah Jump: Treating Zipporah Finamore/Extender: Tonette Bihari, CRYSTA L Weeks in Treatment: 4 Wound Status Wound Number: 2 Primary Diabetic Wound/Ulcer of the Lower Extremity Etiology: Wound Location: Left, Distal, Plantar Foot Wound Open Wounding Event: Blister Status: Date Acquired: 08/02/2019 Comorbid Cataracts, Anemia, Congestive Heart Failure, Hypertension, Type Weeks Of Treatment: 4 History: II Diabetes, Osteomyelitis, Neuropathy Clustered Wound: No Photos Photo Uploaded By: Mikeal Hawthorne on 09/28/2019 10:29:53 Wound Measurements Length: (cm) 1 Width: (cm) 1.6 Depth: (cm) 0.6 Area: (cm) 1.257 Volume: (cm) 0.754 % Reduction in Area: 46.6% % Reduction in Volume: 68% Epithelialization: None Tunneling: No Undermining: No Wound Description Classification: Grade 2 Wound Margin: Well defined, not attached Exudate Amount: Medium Exudate Type: Serosanguineous Exudate Color: red, brown Foul Odor After Cleansing: No Slough/Fibrino Yes Wound  Bed Granulation Amount: Small (1-33%) Exposed Structure Granulation Quality: Pink Fascia Exposed: No Necrotic Amount: Large (67-100%) Fat Layer (Subcutaneous Tissue) Exposed: Yes Necrotic Quality: Adherent Slough Tendon Exposed: Yes Muscle Exposed: No Joint Exposed: No Bone Exposed: No Treatment Notes Wound #2 (Left, Distal, Plantar Foot) 3. Primary Dressing Applied Collegen AG Hydrogel or K-Y Jelly 4. Secondary Dressing ABD Pad Dry Gauze Roll Gauze 7. Footwear/Offloading device applied Surgical shoe Electronic Signature(s) Signed: 09/27/2019 4:42:44 PM By: Deon Pilling Signed: 09/27/2019 4:54:50 PM By: Levan Hurst RN, [name] Entered By: Levan Hurst on 09/27/2019 15:31:49 -------------------------------------------------------------------------------- Wound Assessment Details Patient Name: Date of Service: MA PASQUALINA, COLASURDO 09/27/2019 3:00 PM Medical Record Number: [id number] Patient Account Number: [id number] Date of Birth/Sex: Treating RN: Jun 02, 1965 (54 y.o. [name], Meta.Reding Primary Care Evalin Shawhan: Kym Groom Other Clinician: Referring Rosine Solecki: Treating Haris Baack/Extender: Tonette Bihari, CRYSTA L Weeks in Treatment: 4 Wound Status Wound Number: 3 Primary Diabetic Wound/Ulcer of the Lower Extremity Etiology: Wound Location: Left Metatarsal head first Wound Open Wounding Event: Blister Status: Date Acquired: 08/02/2019 Comorbid Cataracts, Anemia, Congestive Heart Failure, Hypertension, Type Weeks Of Treatment: 4 History: II Diabetes, Osteomyelitis, Neuropathy Clustered Wound: No Photos Photo Uploaded By: Mikeal Hawthorne on 09/28/2019 10:30:30 Wound Measurements Length: (cm) 1.1 Width: (cm) 0.9 Depth: (cm) 0.3 Area: (cm) 0.778 Volume: (cm) 0.233 % Reduction in Area: -3.2% % Reduction in Volume: 55.9% Epithelialization: None Tunneling: No Undermining: No Wound Description Classification: Grade 3 Wound Margin: Well defined, not  attached Exudate Amount: Small Exudate Type: Serosanguineous Exudate Color: red, brown Foul Odor After Cleansing: No Slough/Fibrino Yes Wound Bed Granulation Amount: Medium (34-66%) Exposed Structure Granulation Quality: Pink Fascia Exposed: No Necrotic Amount: Medium (34-66%) Fat Layer (Subcutaneous Tissue) Exposed: Yes Necrotic Quality: Adherent Slough Tendon Exposed: No Muscle Exposed: No Joint Exposed: No Bone Exposed: No Treatment Notes Wound #3 (Left Metatarsal head first) 3. Primary Dressing Applied Collegen AG Hydrogel or K-Y Jelly 4. Secondary Dressing ABD Pad Dry Gauze Roll Gauze 7. Footwear/Offloading device applied Surgical shoe Electronic Signature(s) Signed: 09/27/2019 4:42:44 PM By: Deon Pilling Signed: 09/27/2019 4:54:50 PM By: Levan Hurst RN, [name] Entered By: Levan Hurst on 09/27/2019 15:32:03 -------------------------------------------------------------------------------- Vitals Details Patient Name: Date of Service: MA ZEA, KOSTKA 09/27/2019 3:00 PM Medical Record Number: [id number] Patient Account Number: [id number] Date of Birth/Sex: Treating RN: 08/07/1965 (54 y.o. [name] Primary Care Dakoda Bassette: Kym Groom Other Clinician: Referring Kadar Chance: Treating Jonnathan Birman/Extender: Tonette Bihari, Dereck Leep Weeks in Treatment: 4 Vital Signs Time Taken: 15:11 Temperature (F): 98.4 Height (in): 67 Pulse (  bpm): 74 Weight (lbs): 200 Respiratory Rate (breaths/min): 18 Body Mass Index (BMI): 31.3 Blood Pressure (mmHg): 119/70 Reference Range: 80 - 120 mg / dl Electronic Signature(s) Signed: 09/28/2019 3:01:17 PM By: Sandre Kitty Entered By: Sandre Kitty on 09/27/2019 15:12:13

## 2019-10-11 ENCOUNTER — Encounter (HOSPITAL_BASED_OUTPATIENT_CLINIC_OR_DEPARTMENT_OTHER): Payer: Medicare Other | Attending: Internal Medicine | Admitting: Internal Medicine

## 2019-10-11 DIAGNOSIS — Z89511 Acquired absence of right leg below knee: Secondary | ICD-10-CM | POA: Insufficient documentation

## 2019-10-11 DIAGNOSIS — M86672 Other chronic osteomyelitis, left ankle and foot: Secondary | ICD-10-CM | POA: Diagnosis not present

## 2019-10-11 DIAGNOSIS — L97529 Non-pressure chronic ulcer of other part of left foot with unspecified severity: Secondary | ICD-10-CM | POA: Diagnosis present

## 2019-10-11 DIAGNOSIS — E11621 Type 2 diabetes mellitus with foot ulcer: Secondary | ICD-10-CM | POA: Diagnosis not present

## 2019-10-11 DIAGNOSIS — L97523 Non-pressure chronic ulcer of other part of left foot with necrosis of muscle: Secondary | ICD-10-CM | POA: Diagnosis not present

## 2019-10-12 NOTE — Progress Notes (Signed)
THANIA, WOODLIEF ([id number]) Visit Report for 10/11/2019 Arrival Information Details Patient Name: Date of Service: MA FARRIE, SANN 10/11/2019 3:00 PM Medical Record Number: [id number] Patient Account Number: [id number] Date of Birth/Sex: Treating RN: Feb 26, 1965 (54 y.o. [name], Meta.Reding Primary Care Provider: [name] Other Clinician: Referring Provider: Treating Provider/Extender: Tonette Bihari, [name] in Treatment: 6 Visit Information History Since Last Visit Added or deleted any medications: No Patient Arrived: Wheel Chair Any new allergies or adverse reactions: No Arrival Time: 16:07 Had a fall or experienced change in No Accompanied By: self activities of daily living that may affect Transfer Assistance: None risk of falls: Patient Identification Verified: Yes Signs or symptoms of abuse/neglect since last visito No Secondary Verification Process Completed: Yes Hospitalized since last visit: No Implantable device outside of the clinic excluding No cellular tissue based products placed in the center since last visit: Has Dressing in Place as Prescribed: Yes Pain Present Now: No Electronic Signature(s) Signed: 10/12/2019 11:39:24 AM By: Sandre Kitty Entered By: Sandre Kitty on 10/11/2019 16:09:15 -------------------------------------------------------------------------------- Lower Extremity Assessment Details Patient Name: Date of Service: MA BAYLEN, DEA 10/11/2019 3:00 PM Medical Record Number: [id number] Patient Account Number: [id number] Date of Birth/Sex: Treating RN: 03-13-1965 (54 y.o. Nancy Fetter Primary Care Provider: [name] Other Clinician: Referring Provider: Treating Provider/Extender: Tonette Bihari, [name] in Treatment: 6 Edema Assessment Assessed: [Left: No] [Right: No] Edema: [Left: Ye] [Right: s] Calf Left: Right: Point of Measurement: From Medial Instep 35.5 cm Ankle Left:  Right: Point of Measurement: From Medial Instep 25 cm Vascular Assessment Pulses: Dorsalis Pedis Palpable: [Left:Yes] Electronic Signature(s) Signed: 10/12/2019 5:41:32 PM By: Levan Hurst RN, [name] Entered By: Levan Hurst on 10/11/2019 16:21:52 -------------------------------------------------------------------------------- Multi Wound Chart Details Patient Name: Date of Service: MA [name]. 10/11/2019 3:00 PM Medical Record Number: [id number] Patient Account Number: [id number] Date of Birth/Sex: Treating RN: 22-Oct-1965 (54 y.o. [name], Meta.Reding Primary Care Provider: [name] Other Clinician: Referring Provider: Treating Provider/Extender: Tonette Bihari, [name] in Treatment: 6 Vital Signs Height(in): 38 Pulse(bpm): 58 Weight(lbs): 200 Blood Pressure(mmHg): 143/79 Body Mass Index(BMI): 31 Temperature(F): 98.3 Respiratory Rate(breaths/min): 18 Photos: [1:No Photos Left, Proximal, Plantar Foot] [2:No Photos Left, Distal, Plantar Foot] [3:No Photos Left Metatarsal head first] Wound Location: [1:Blister] [2:Blister] [3:Blister] Wounding Event: [1:Diabetic Wound/Ulcer of the Lower] [2:Diabetic Wound/Ulcer of the Lower] [3:Diabetic Wound/Ulcer of the Lower] Primary Etiology: [1:Extremity Cataracts, Anemia, Congestive Heart Cataracts, Anemia, Congestive Heart Cataracts, Anemia, Congestive Heart] [2:Extremity] [3:Extremity] Comorbid History: [1:Failure, Hypertension, Type II Diabetes, Osteomyelitis, Neuropathy Diabetes, Osteomyelitis, Neuropathy Diabetes, Osteomyelitis, Neuropathy 08/02/2019] [2:Failure, Hypertension, Type II 08/02/2019] [3:Failure, Hypertension, Type II  08/02/2019] Date Acquired: [1:6] [2:6] [3:6] Weeks of Treatment: [1:Open] [2:Open] [3:Open] Wound Status: [1:2.4x3x2.1] [2:1x1.9x0.6] [3:0.8x0.8x0.4] Measurements L x W x D (cm) [1:5.655] [2:1.492] [3:0.503] A (cm) : rea [1:11.875] [2:0.895] [3:0.201] Volume (cm) : [1:-28.60%]  [2:36.70%] [3:33.30%] % Reduction in A [1:rea: -12.50%] [2:62.00%] [3:61.90%] % Reduction in Volume: [1:Grade 2] [2:Grade 2] [3:Grade 3] Classification: [1:Medium] [2:Medium] [3:Small] Exudate A mount: [1:Serosanguineous] [2:Serosanguineous] [3:Serosanguineous] Exudate Type: [1:red, brown] [2:red, brown] [3:red, brown] Exudate Color: [1:Well defined, not attached] [2:Well defined, not attached] [3:Well defined, not attached] Wound Margin: [1:Small (1-33%)] [2:Medium (34-66%)] [3:Small (1-33%)] Granulation A mount: [1:Pink] [2:Pink] [3:Pink] Granulation Quality: [1:Large (67-100%)] [2:Medium (34-66%)] [3:Large (67-100%)] Necrotic A mount: [1:Fat Layer (Subcutaneous Tissue): Yes Fat Layer (Subcutaneous Tissue): Yes Fat Layer (Subcutaneous Tissue): Yes] Exposed Structures: [1:Muscle: Yes Fascia: No Tendon: No Joint: No Bone: No None] [2:Tendon: Yes  Fascia: No Muscle: No Joint: No Bone: No None] [3:Fascia: No Tendon: No Muscle: No Joint: No Bone: No None] Epithelialization: [1:Debridement - Excisional] [2:Debridement - Excisional] [3:Debridement - Excisional] Debridement: Pre-procedure Verification/Time Out 16:35 [2:16:35] [3:16:35] Taken: [1:Lidocaine 4% Topical Solution] [2:Lidocaine 4% Topical Solution] [3:Lidocaine 4% Topical Solution] Pain Control: [1:Muscle, Subcutaneous, Slough] [2:Subcutaneous, Slough] [3:Subcutaneous, Slough] Tissue Debrided: [1:Skin/Subcutaneous Tissue/Muscle] [2:Skin/Subcutaneous Tissue] [3:Skin/Subcutaneous Tissue] Level: [1:7.2] [2:1.9] [3:0.64] Debridement A (sq cm): [1:rea Curette] [2:Curette] [3:Curette] Instrument: [1:Moderate] [2:Moderate] [3:Moderate] Bleeding: [1:Pressure] [2:Pressure] [3:Pressure] Hemostasis A chieved: [1:0] [2:0] [3:0] Procedural Pain: [1:0] [2:0] [3:0] Post Procedural Pain: [1:Procedure was tolerated well] [2:Procedure was tolerated well] [3:Procedure was tolerated well] Debridement Treatment Response: [1:2.4x3x2.1] [2:1x1.9x0.6]  [3:0.8x0.8x0.4] Post Debridement Measurements L x W x D (cm) [1:11.875] [2:0.895] [3:0.201] Post Debridement Volume: (cm) [1:Debridement] [2:Debridement] [3:Debridement] Procedures Performed: Treatment Notes Electronic Signature(s) Signed: 10/12/2019 8:12:45 AM By: Linton Ham MD Signed: 10/12/2019 5:50:34 PM By: Deon Pilling Entered By: Linton Ham on 10/12/2019 08:00:58 -------------------------------------------------------------------------------- Multi-Disciplinary Care Plan Details Patient Name: Date of Service: MA [name]. 10/11/2019 3:00 PM Medical Record Number: [id number] Patient Account Number: [id number] Date of Birth/Sex: Treating RN: 22-Jul-1965 (54 y.o. [name], [name] Primary Care Provider: [name] Other Clinician: Referring Provider: Treating Provider/Extender: Tonette Bihari, [name] in Treatment: 6 Active Inactive Abuse / Safety / Falls / Self Care Management Nursing Diagnoses: Potential for falls Potential for injury related to falls Goals: Patient will not experience any injury related to falls Date Initiated: 08/30/2019 Target Resolution Date: 11/02/2019 Goal Status: Active Patient/caregiver will verbalize/demonstrate measures taken to prevent injury and/or falls Date Initiated: 08/30/2019 Date Inactivated: 09/27/2019 Target Resolution Date: 09/28/2019 Goal Status: Met Interventions: Assess Activities of Daily Living upon admission and as needed Assess fall risk on admission and as needed Assess: immobility, friction, shearing, incontinence upon admission and as needed Assess impairment of mobility on admission and as needed per policy Assess personal safety and home safety (as indicated) on admission and as needed Assess self care needs on admission and as needed Provide education on fall prevention Provide education on personal and home safety Notes: Nutrition Nursing Diagnoses: Impaired glucose control: actual or  potential Potential for alteratiion in Nutrition/Potential for imbalanced nutrition Goals: Patient/caregiver agrees to and verbalizes understanding of need to use nutritional supplements and/or vitamins as prescribed Date Initiated: 08/30/2019 Date Inactivated: 10/11/2019 Target Resolution Date: 10/05/2019 Goal Status: Met Patient/caregiver will maintain therapeutic glucose control Date Initiated: 08/30/2019 Target Resolution Date: 11/02/2019 Goal Status: Active Interventions: Assess HgA1c results as ordered upon admission and as needed Assess patient nutrition upon admission and as needed per policy Provide education on elevated blood sugars and impact on wound healing Provide education on nutrition Treatment Activities: Education provided on Nutrition : 08/30/2019 Notes: Wound/Skin Impairment Nursing Diagnoses: Impaired tissue integrity Knowledge deficit related to ulceration/compromised skin integrity Goals: Patient/caregiver will verbalize understanding of skin care regimen Date Initiated: 08/30/2019 Target Resolution Date: 11/02/2019 Goal Status: Active Interventions: Assess patient/caregiver ability to obtain necessary supplies Assess patient/caregiver ability to perform ulcer/skin care regimen upon admission and as needed Assess ulceration(s) every visit Provide education on ulcer and skin care Notes: Electronic Signature(s) Signed: 10/11/2019 6:22:31 PM By: Deon Pilling Entered By: Deon Pilling on 10/11/2019 16:30:56 -------------------------------------------------------------------------------- Pain Assessment Details Patient Name: Date of Service: BIRGITTA, UHLIR 10/11/2019 3:00 PM Medical Record Number: [id number] Patient Account Number: [id number] Date of Birth/Sex: Treating RN: Nov 18, 1965 (54 y.o. [name] Primary Care Provider: [name] Other Clinician: Referring Provider: Treating Provider/Extender: Dellia Nims  Grayland Ormond, Crystal Weeks in  Treatment: 6 Active Problems Location of Pain Severity and Description of Pain Patient Has Paino No Site Locations Pain Management and Medication Current Pain Management: Electronic Signature(s) Signed: 10/11/2019 6:22:31 PM By: Deon Pilling Signed: 10/12/2019 11:39:24 AM By: Sandre Kitty Signed: 10/12/2019 11:39:24 AM By: Sandre Kitty Entered By: Sandre Kitty on 10/11/2019 16:09:41 -------------------------------------------------------------------------------- Patient/Caregiver Education Details Patient Name: Date of Service: MA CLORA, OHMER 10/7/2021andnbsp3:00 PM Medical Record Number: [id number] Patient Account Number: [id number] Date of Birth/Gender: Treating RN: 21-Aug-1965 (54 y.o. [name] Primary Care Physician: [name] Other Clinician: Referring Physician: Treating Physician/Extender: Sonda Primes in Treatment: 6 Education Assessment Education Provided To: Patient Education Topics Provided Wound/Skin Impairment: Handouts: Skin Care Do's and Dont's Methods: Explain/Verbal Responses: Reinforcements needed Electronic Signature(s) Signed: 10/11/2019 6:22:31 PM By: Deon Pilling Entered By: Deon Pilling on 10/11/2019 16:31:19 -------------------------------------------------------------------------------- Wound Assessment Details Patient Name: Date of Service: MA DENELL, COTHERN 10/11/2019 3:00 PM Medical Record Number: [id number] Patient Account Number: [id number] Date of Birth/Sex: Treating RN: 1965-08-09 (54 y.o. [name], [name] Primary Care Hudsyn Barich: [name] Other Clinician: Referring Venesha Petraitis: Treating Haedyn Breau/Extender: Tonette Bihari, [name] in Treatment: 6 Wound Status Wound Number: 1 Primary Diabetic Wound/Ulcer of the Lower Extremity Etiology: Wound Location: Left, Proximal, Plantar Foot Wound Open Wounding Event: Blister Status: Date Acquired: 08/02/2019 Comorbid Cataracts,  Anemia, Congestive Heart Failure, Hypertension, Type Weeks Of Treatment: 6 History: II Diabetes, Osteomyelitis, Neuropathy Clustered Wound: No Wound Measurements Length: (cm) 2.4 Width: (cm) 3 Depth: (cm) 2.1 Area: (cm) 5.655 Volume: (cm) 11.875 % Reduction in Area: -28.6% % Reduction in Volume: -12.5% Epithelialization: None Tunneling: No Undermining: No Wound Description Classification: Grade 2 Wound Margin: Well defined, not attached Exudate Amount: Medium Exudate Type: Serosanguineous Exudate Color: red, brown Foul Odor After Cleansing: No Slough/Fibrino Yes Wound Bed Granulation Amount: Small (1-33%) Exposed Structure Granulation Quality: Pink Fascia Exposed: No Necrotic Amount: Large (67-100%) Fat Layer (Subcutaneous Tissue) Exposed: Yes Necrotic Quality: Adherent Slough Tendon Exposed: No Muscle Exposed: Yes Necrosis of Muscle: Yes Joint Exposed: No Bone Exposed: No Electronic Signature(s) Signed: 10/11/2019 6:22:31 PM By: Deon Pilling Signed: 10/12/2019 5:41:32 PM By: Levan Hurst RN, [name] Entered By: Levan Hurst on 10/11/2019 16:22:53 -------------------------------------------------------------------------------- Wound Assessment Details Patient Name: Date of Service: MA [name]. 10/11/2019 3:00 PM Medical Record Number: [id number] Patient Account Number: [id number] Date of Birth/Sex: Treating RN: 1965-12-15 (54 y.o. [name], Meta.Reding Primary Care Shadee Montoya: [name] Other Clinician: Referring Cythnia Osmun: Treating Zev Blue/Extender: Tonette Bihari, [name] in Treatment: 6 Wound Status Wound Number: 2 Primary Diabetic Wound/Ulcer of the Lower Extremity Etiology: Wound Location: Left, Distal, Plantar Foot Wound Open Wounding Event: Blister Status: Date Acquired: 08/02/2019 Comorbid Cataracts, Anemia, Congestive Heart Failure, Hypertension, Type Weeks Of Treatment: 6 History: II Diabetes, Osteomyelitis,  Neuropathy Clustered Wound: No Wound Measurements Length: (cm) 1 Width: (cm) 1.9 Depth: (cm) 0.6 Area: (cm) 1.492 Volume: (cm) 0.895 % Reduction in Area: 36.7% % Reduction in Volume: 62% Epithelialization: None Tunneling: No Undermining: No Wound Description Classification: Grade 2 Wound Margin: Well defined, not attached Exudate Amount: Medium Exudate Type: Serosanguineous Exudate Color: red, brown Foul Odor After Cleansing: No Slough/Fibrino Yes Wound Bed Granulation Amount: Medium (34-66%) Exposed Structure Granulation Quality: Pink Fascia Exposed: No Necrotic Amount: Medium (34-66%) Fat Layer (Subcutaneous Tissue) Exposed: Yes Necrotic Quality: Adherent Slough Tendon Exposed: Yes Muscle Exposed: No Joint Exposed: No Bone Exposed: No Electronic Signature(s) Signed: 10/11/2019 6:22:31 PM By: Deon Pilling Signed: 10/12/2019  5:41:32 PM By: Levan Hurst RN, [name] Entered By: Levan Hurst on 10/11/2019 16:23:13 -------------------------------------------------------------------------------- Wound Assessment Details Patient Name: Date of Service: MA JOLEEN, STUCKERT 10/11/2019 3:00 PM Medical Record Number: [id number] Patient Account Number: [id number] Date of Birth/Sex: Treating RN: January 29, 1965 (54 y.o. [name], [name] Primary Care Angeleigh Chiasson: [name] Other Clinician: Referring Lilton Pare: Treating Giamarie Bueche/Extender: Tonette Bihari, [name] in Treatment: 6 Wound Status Wound Number: 3 Primary Diabetic Wound/Ulcer of the Lower Extremity Etiology: Wound Location: Left Metatarsal head first Wound Open Wounding Event: Blister Status: Date Acquired: 08/02/2019 Comorbid Cataracts, Anemia, Congestive Heart Failure, Hypertension, Type Weeks Of Treatment: 6 History: II Diabetes, Osteomyelitis, Neuropathy Clustered Wound: No Wound Measurements Length: (cm) 0.8 Width: (cm) 0.8 Depth: (cm) 0.4 Area: (cm) 0.503 Volume: (cm) 0.201 % Reduction in  Area: 33.3% % Reduction in Volume: 61.9% Epithelialization: None Tunneling: No Undermining: No Wound Description Classification: Grade 3 Wound Margin: Well defined, not attached Exudate Amount: Small Exudate Type: Serosanguineous Exudate Color: red, brown Foul Odor After Cleansing: No Slough/Fibrino Yes Wound Bed Granulation Amount: Small (1-33%) Exposed Structure Granulation Quality: Pink Fascia Exposed: No Necrotic Amount: Large (67-100%) Fat Layer (Subcutaneous Tissue) Exposed: Yes Necrotic Quality: Adherent Slough Tendon Exposed: No Muscle Exposed: No Joint Exposed: No Bone Exposed: No Electronic Signature(s) Signed: 10/11/2019 6:22:31 PM By: Deon Pilling Signed: 10/12/2019 5:41:32 PM By: Levan Hurst RN, [name] Entered By: Levan Hurst on 10/11/2019 16:23:32 -------------------------------------------------------------------------------- [place] Details Patient Name: Date of Service: MA [name]. 10/11/2019 3:00 PM Medical Record Number: [id number] Patient Account Number: [id number] Date of Birth/Sex: Treating RN: 08/31/1965 (54 y.o. [name], [name] Primary Care Pairlee Sawtell: [name] Other Clinician: Referring Braylon Lemmons: Treating Diquan Kassis/Extender: Tonette Bihari, [name] in Treatment: 6 Vital Signs Time Taken: 16:09 Temperature (F): 98.3 Height (in): 67 Pulse (bpm): 82 Weight (lbs): 200 Respiratory Rate (breaths/min): 18 Body Mass Index (BMI): 31.3 Blood Pressure (mmHg): 143/79 Reference Range: 80 - 120 mg / dl Electronic Signature(s) Signed: 10/12/2019 11:39:24 AM By: Sandre Kitty Entered By: Sandre Kitty on 10/11/2019 16:09:34

## 2019-10-12 NOTE — Progress Notes (Signed)
[name], [name] ([id number]) Visit Report for 10/11/2019 Debridement Details Patient Name: Date of Service: [name], [name] 10/11/2019 3:00 PM Medical Record Number: [id number] Patient Account Number: [id number] Date of Birth/Sex: Treating RN: 03-02-1965 (54 y.o. [name], [name] Primary Care Provider: [name] Other Clinician: Referring Provider: Treating Provider/Extender: Tonette Bihari, [name] in Treatment: 6 Debridement Performed for Assessment: Wound #1 Left,Proximal,Plantar Foot Performed By: Physician Ricard Dillon., MD Debridement Type: Debridement Severity of Tissue Pre Debridement: Fat layer exposed Level of Consciousness (Pre-procedure): Awake and Alert Pre-procedure Verification/Time Out Yes - 16:35 Taken: Start Time: 16:36 Pain Control: Lidocaine 4% T opical Solution T Area Debrided (L x W): otal 2.4 (cm) x 3 (cm) = 7.2 (cm) Tissue and other material debrided: Viable, Non-Viable, Muscle, Slough, Subcutaneous, Skin: Dermis , Skin: Epidermis, Fibrin/Exudate, Slough Level: Skin/Subcutaneous Tissue/Muscle Debridement Description: Excisional Instrument: Curette Bleeding: Moderate Hemostasis Achieved: Pressure End Time: 16:40 Procedural Pain: 0 Post Procedural Pain: 0 Response to Treatment: Procedure was tolerated well Level of Consciousness (Post- Awake and Alert procedure): Post Debridement Measurements of Total Wound Length: (cm) 2.4 Width: (cm) 3 Depth: (cm) 2.1 Volume: (cm) 11.875 Character of Wound/Ulcer Post Debridement: Improved Severity of Tissue Post Debridement: Muscle involvement without necrosis Post Procedure Diagnosis Same as Pre-procedure Electronic Signature(s) Signed: 10/12/2019 8:12:45 AM By: Linton Ham MD Signed: 10/12/2019 5:50:34 PM By: Deon Pilling Previous Signature: 10/11/2019 6:22:31 PM Version By: Deon Pilling Entered By: Linton Ham on 10/12/2019  08:01:07 -------------------------------------------------------------------------------- Debridement Details Patient Name: Date of Service: [name]. 10/11/2019 3:00 PM Medical Record Number: [id number] Patient Account Number: [id number] Date of Birth/Sex: Treating RN: Sep 30, 1965 (54 y.o. [name] Primary Care Provider: [name] Other Clinician: Referring Provider: Treating Provider/Extender: Tonette Bihari, [name] in Treatment: 6 Debridement Performed for Assessment: Wound #2 Left,Distal,Plantar Foot Performed By: Physician Ricard Dillon., MD Debridement Type: Debridement Severity of Tissue Pre Debridement: Fat layer exposed Level of Consciousness (Pre-procedure): Awake and Alert Pre-procedure Verification/Time Out Yes - 16:35 Taken: Start Time: 16:36 Pain Control: Lidocaine 4% T opical Solution T Area Debrided (L x W): otal 1 (cm) x 1.9 (cm) = 1.9 (cm) Tissue and other material debrided: Viable, Non-Viable, Slough, Subcutaneous, Skin: Dermis , Skin: Epidermis, Fibrin/Exudate, Slough Level: Skin/Subcutaneous Tissue Debridement Description: Excisional Instrument: Curette Bleeding: Moderate Hemostasis Achieved: Pressure End Time: 16:40 Procedural Pain: 0 Post Procedural Pain: 0 Response to Treatment: Procedure was tolerated well Level of Consciousness (Post- Awake and Alert procedure): Post Debridement Measurements of Total Wound Length: (cm) 1 Width: (cm) 1.9 Depth: (cm) 0.6 Volume: (cm) 0.895 Character of Wound/Ulcer Post Debridement: Improved Severity of Tissue Post Debridement: Fat layer exposed Post Procedure Diagnosis Same as Pre-procedure Electronic Signature(s) Signed: 10/12/2019 8:12:45 AM By: Linton Ham MD Signed: 10/12/2019 5:50:34 PM By: Deon Pilling Previous Signature: 10/11/2019 6:22:31 PM Version By: Deon Pilling Entered By: Linton Ham on 10/12/2019  08:01:20 -------------------------------------------------------------------------------- Debridement Details Patient Name: Date of Service: [name]. 10/11/2019 3:00 PM Medical Record Number: [id number] Patient Account Number: [id number] Date of Birth/Sex: Treating RN: 1965-02-15 (54 y.o. [name] Primary Care Provider: [name] Other Clinician: Referring Provider: Treating Provider/Extender: Tonette Bihari, [name] in Treatment: 6 Debridement Performed for Assessment: Wound #3 Left Metatarsal head first Performed By: Physician Ricard Dillon., MD Debridement Type: Debridement Severity of Tissue Pre Debridement: Fat layer exposed Level of Consciousness (Pre-procedure): Awake and Alert Pre-procedure Verification/Time Out Yes - 16:35 Taken: Start Time: 16:36 Pain Control: Lidocaine 4% T  opical Solution T Area Debrided (L x W): otal 0.8 (cm) x 0.8 (cm) = 0.64 (cm) Tissue and other material debrided: Viable, Non-Viable, Slough, Subcutaneous, Skin: Dermis , Skin: Epidermis, Fibrin/Exudate, Slough Level: Skin/Subcutaneous Tissue Debridement Description: Excisional Instrument: Curette Bleeding: Moderate Hemostasis Achieved: Pressure End Time: 16:40 Procedural Pain: 0 Post Procedural Pain: 0 Response to Treatment: Procedure was tolerated well Level of Consciousness (Post- Level of Consciousness (Post- Awake and Alert procedure): Post Debridement Measurements of Total Wound Length: (cm) 0.8 Width: (cm) 0.8 Depth: (cm) 0.4 Volume: (cm) 0.201 Character of Wound/Ulcer Post Debridement: Improved Severity of Tissue Post Debridement: Fat layer exposed Post Procedure Diagnosis Same as Pre-procedure Electronic Signature(s) Signed: 10/12/2019 8:12:45 AM By: Linton Ham MD Signed: 10/12/2019 5:50:34 PM By: Deon Pilling Previous Signature: 10/11/2019 6:22:31 PM Version By: Deon Pilling Entered By: Linton Ham on 10/12/2019  08:01:34 -------------------------------------------------------------------------------- HPI Details Patient Name: Date of Service: [name]. 10/11/2019 3:00 PM Medical Record Number: [id number] Patient Account Number: [id number] Date of Birth/Sex: Treating RN: 05/29/1965 (54 y.o. [name] Primary Care Provider: [name] Other Clinician: Referring Provider: Treating Provider/Extender: Tonette Bihari, [name] in Treatment: 6 History of Present Illness HPI Description: ADMISSION 08/30/2019 This is a 54 year old woman with type 2 diabetes and peripheral neuropathy. She developed a blister on the plantar left foot on 7/27. She was seen in the emergency room treated. This did not get any better and she required admission to the hospital from 08/02/2019 through 08/09/2019. She was felt to have cellulitis of the left foot. She was treated with IV medications/antibiotics discharged on Augmentin however she could not tolerate this and she was changed to doxycycline for 5 days she has completed this. She has advanced home care they are apparently putting Xeroform over the wound. When she left the hospital she was supposed to be putting Santyl over the black areas on the left plantar foot covered with Xeroform. X-rays done in the hospital did not show evidence of osteomyelitis only a superficial plantar ulceration. She had blood cultures but I do not see a wound culture. The patient has a past medical history of a right below-knee amputation by Dr. [name] in 2012 presumably for infection in this area although I am not certain. As noted she is a type II diabetic on oral agents with a recent hemoglobin A1c of 5.5. Other past medical history includes ITP, peripheral edema, chronic kidney disease stage III, hypertension, right below-knee amputation, diastolic heart failure. She tells me she is wheelchair-bound and is nonweightbearing although she does push the wheelchair along  with the left foot. She is only using hospital sock on the left foot. ABI in our clinic was 1.06 on the left 9/7; the patient cultured Pseudomonas last week out of the deeper proximal wound. MRI that was ordered showed progressive soft tissue ulceration along the plantar aspect of the foot especially along the medial arch but also plantar to the first metatarsal phalangeal joint no drainable abscess or foreign body. There was a suspicion for early osteomyelitis in the fifth metatarsal base but she does not have a wound in this area. Also problematically is that where we put her prescription for ciprofloxacin and they did not have the antibiotic and we verified that they are out of ciprofloxacin. We did not receive the message from them to this effect The patient did see Dr. [name] of podiatry. The wound was debrided. He prescribed Santyl which I do not think she is picked up either. Lab  work including a CBC sedimentation rate and C-reactive protein were ordered. Finally she is using silver alginate she has home health changing the dressing 3 times a week she does not disturb this in between times 9/23; we have not seen this lady in 2-1/2 weeks largely because of transportation issues. She still has 3 wound areas a deep area in the midfoot and area just above this and then an area on the first met head. She has been using silver alginate on the wound. They are trying to use home health however they are trying to teach her how to do this at be been very difficult for this patient to do so. In discussing things with her she uses this foot to propel her wheelchair I have asked her to stop doing this and to use her arms. She states that she is only moving around in her house. We will need to have a look at her lab work that I quoted from the last time she was here that was ordered by Dr. [name] of podiatry. By our MRI she does not have osteomyelitis in any of the wounded areas. 10/7 2-week follow-up.  The patient has 3 wounds including superficially over the left first metatarsal head, left first submetatarsal in the lateral foot and a deep area more distally. Her MRI did not show osteomyelitis in reference to the wounds that we are treating although did suggest an early possibility in the left fifth metatarsal head however there is no wound in this area. I have reviewed her inflammatory markers. Most recently on 7/26 she had a sedimentation rate of 117 although this was previously greater than 140 and then 108. Last CRP was on 8/20 at 5.1 comparing to previously at 4.27. Noteworthy that this is when she was in the hospital in July I believe. Electronic Signature(s) Signed: 10/12/2019 8:12:45 AM By: Linton Ham MD Entered By: Linton Ham on 10/12/2019 08:06:32 -------------------------------------------------------------------------------- Physical Exam Details Patient Name: Date of Service: [name], [name] 10/11/2019 3:00 PM Medical Record Number: [id number] Patient Account Number: [id number] Date of Birth/Sex: Treating RN: 1965/07/05 (54 y.o. [name] Primary Care Provider: [name] Other Clinician: Referring Provider: Treating Provider/Extender: Tonette Bihari, [name] in Treatment: 6 Constitutional Sitting or standing Blood Pressure is within target range for patient.. Pulse regular and within target range for patient.Marland Kitchen Respirations regular, non-labored and within target range.. Temperature is normal and within the target range for the patient.Marland Kitchen Appears in no distress. Notes Wound exam 3 open areas the superficial area on the left first metatarsal head, left medial foot and the major area more distally in the foot which is deep necrotic and still requiring debridement with a #5 curette. This does not probe to bone but it cannot be far off this. There is no surrounding erythema in any of the wounds no crepitus no purulence. The patient has a right  BKA making properly offloading this foot virtually impossible Electronic Signature(s) Signed: 10/12/2019 8:12:45 AM By: Linton Ham MD Entered By: Linton Ham on 10/12/2019 08:07:50 -------------------------------------------------------------------------------- Physician Orders Details Patient Name: Date of Service: [name], [name] 10/11/2019 3:00 PM Medical Record Number: [id number] Patient Account Number: [id number] Date of Birth/Sex: Treating RN: July 28, 1965 (54 y.o. [name] Primary Care Provider: [name] Other Clinician: Referring Provider: Treating Provider/Extender: Tonette Bihari, [name] in Treatment: 6 Verbal / Phone Orders: No Diagnosis Coding ICD-10 Coding Code Description E11.621 Type 2 diabetes mellitus with foot ulcer L97.523 Non-pressure chronic ulcer  of other part of left foot with necrosis of muscle Z89.511 Acquired absence of right leg below knee M86.672 Other chronic osteomyelitis, left ankle and foot Follow-up Appointments Return Appointment in 1 week. Dressing Change Frequency Change dressing three times week. - Twice a week home health. wound center weekly. Wound Cleansing Clean wound with Wound Cleanser - or normal saline - all wounds Primary Wound Dressing Wound #1 Left,Proximal,Plantar Foot Silver Collagen - moisten with hydrogel or [place] Jelly. Wound #2 Left,Distal,Plantar Foot Silver Collagen - moisten with hydrogel or [place] Jelly. Wound #3 Left Metatarsal head first Silver Collagen - moisten with hydrogel or [place] Jelly. Secondary Dressing Kerlix/Rolled Gauze Dry Gauze ABD pad Edema Control Avoid standing for long periods of time Elevate legs to the level of the heart or above for 30 minutes daily and/or when sitting, a frequency of: - throughout the day. Off-Loading Open toe surgical shoe to: - left foot. ensure no pressure to bottom of left foot. use arms to push wheelchair around not your foot. [facility] for wound care. - [facility] twice a week home health. wound center weekly. Electronic Signature(s) Signed: 10/11/2019 6:22:31 PM By: Deon Pilling Signed: 10/12/2019 8:12:45 AM By: Linton Ham MD Entered By: Deon Pilling on 10/11/2019 16:44:32 -------------------------------------------------------------------------------- Problem List Details Patient Name: Date of Service: [name], [name] 10/11/2019 3:00 PM Medical Record Number: [id number] Patient Account Number: [id number] Date of Birth/Sex: Treating RN: Jun 24, 1965 (54 y.o. [name] Primary Care Provider: [name] Other Clinician: Referring Provider: Treating Provider/Extender: Tonette Bihari, [name] in Treatment: 6 Active Problems ICD-10 Encounter Code Description Active Date MDM Diagnosis E11.621 Type 2 diabetes mellitus with foot ulcer 08/30/2019 No Yes L97.523 Non-pressure chronic ulcer of other part of left foot with necrosis of muscle 08/30/2019 No Yes Z89.511 Acquired absence of right leg below knee 08/30/2019 No Yes M86.672 Other chronic osteomyelitis, left ankle and foot 08/30/2019 No Yes Inactive Problems Resolved Problems Electronic Signature(s) Signed: 10/11/2019 6:22:31 PM By: Deon Pilling Signed: 10/12/2019 8:12:45 AM By: Linton Ham MD Entered By: Deon Pilling on 10/11/2019 16:30:35 -------------------------------------------------------------------------------- Progress Note Details Patient Name: Date of Service: [name]. 10/11/2019 3:00 PM Medical Record Number: [id number] Patient Account Number: [id number] Date of Birth/Sex: Treating RN: 12-18-1965 (54 y.o. [name] Primary Care Provider: [name] Other Clinician: Referring Provider: Treating Provider/Extender: Tonette Bihari, [name] in Treatment: 6 Subjective History of Present Illness (HPI) ADMISSION 08/30/2019 This is a  54 year old woman with type 2 diabetes and peripheral neuropathy. She developed a blister on the plantar left foot on 7/27. She was seen in the emergency room treated. This did not get any better and she required admission to the hospital from 08/02/2019 through 08/09/2019. She was felt to have cellulitis of the left foot. She was treated with IV medications/antibiotics discharged on Augmentin however she could not tolerate this and she was changed to doxycycline for 5 days she has completed this. She has advanced home care they are apparently putting Xeroform over the wound. When she left the hospital she was supposed to be putting Santyl over the black areas on the left plantar foot covered with Xeroform. X-rays done in the hospital did not show evidence of osteomyelitis only a superficial plantar ulceration. She had blood cultures but I do not see a wound culture. The patient has a past medical history of a right below-knee amputation by Dr. [name] in 2012 presumably for infection in this  area although I am not certain. As noted she is a type II diabetic on oral agents with a recent hemoglobin A1c of 5.5. Other past medical history includes ITP, peripheral edema, chronic kidney disease stage III, hypertension, right below-knee amputation, diastolic heart failure. She tells me she is wheelchair-bound and is nonweightbearing although she does push the wheelchair along with the left foot. She is only using hospital sock on the left foot. ABI in our clinic was 1.06 on the left 9/7; the patient cultured Pseudomonas last week out of the deeper proximal wound. MRI that was ordered showed progressive soft tissue ulceration along the plantar aspect of the foot especially along the medial arch but also plantar to the first metatarsal phalangeal joint no drainable abscess or foreign body. There was a suspicion for early osteomyelitis in the fifth metatarsal base but she does not have a wound in this area. Also  problematically is that where we put her prescription for ciprofloxacin and they did not have the antibiotic and we verified that they are out of ciprofloxacin. We did not receive the message from them to this effect The patient did see Dr. [name] of podiatry. The wound was debrided. He prescribed Santyl which I do not think she is picked up either. Lab work including a CBC sedimentation rate and C-reactive protein were ordered. Finally she is using silver alginate she has home health changing the dressing 3 times a week she does not disturb this in between times 9/23; we have not seen this lady in 2-1/2 weeks largely because of transportation issues. She still has 3 wound areas a deep area in the midfoot and area just above this and then an area on the first met head. She has been using silver alginate on the wound. They are trying to use home health however they are trying to teach her how to do this at be been very difficult for this patient to do so. In discussing things with her she uses this foot to propel her wheelchair I have asked her to stop doing this and to use her arms. She states that she is only moving around in her house. We will need to have a look at her lab work that I quoted from the last time she was here that was ordered by Dr. [name] of podiatry. By our MRI she does not have osteomyelitis in any of the wounded areas. 10/7 2-week follow-up. The patient has 3 wounds including superficially over the left first metatarsal head, left first submetatarsal in the lateral foot and a deep area more distally. Her MRI did not show osteomyelitis in reference to the wounds that we are treating although did suggest an early possibility in the left fifth metatarsal head however there is no wound in this area. I have reviewed her inflammatory markers. Most recently on 7/26 she had a sedimentation rate of 117 although this was previously greater than 140 and then 108. Last CRP was on 8/20 at  5.1 comparing to previously at 4.27. Noteworthy that this is when she was in the hospital in July I believe. Objective Constitutional Sitting or standing Blood Pressure is within target range for patient.. Pulse regular and within target range for patient.Marland Kitchen Respirations regular, non-labored and within target range.. Temperature is normal and within the target range for the patient.Marland Kitchen Appears in no distress. Vitals Time Taken: 4:09 PM, Height: 67 in, Weight: 200 lbs, BMI: 31.3, Temperature: 98.3 F, Pulse: 82 bpm, Respiratory Rate: 18 breaths/min, Blood Pressure:  143/79 mmHg. General Notes: Wound exam oo3 open areas the superficial area on the left first metatarsal head, left medial foot and the major area more distally in the foot which is deep necrotic and still requiring debridement with a #5 curette. This does not probe to bone but it cannot be far off this. There is no surrounding erythema in any of the wounds no crepitus no purulence. The patient has a right BKA making properly offloading this foot virtually impossible Integumentary (Hair, Skin) Wound #1 status is Open. Original cause of wound was Blister. The wound is located on the Left,Proximal,Plantar Foot. The wound measures 2.4cm length x 3cm width x 2.1cm depth; 5.655cm^2 area and 11.875cm^3 volume. There is muscle and Fat Layer (Subcutaneous Tissue) exposed. There is no tunneling or undermining noted. There is a medium amount of serosanguineous drainage noted. The wound margin is well defined and not attached to the wound base. There is small (1-33%) pink granulation within the wound bed. There is a large (67-100%) amount of necrotic tissue within the wound bed including Adherent Slough and Necrosis of Muscle. Wound #2 status is Open. Original cause of wound was Blister. The wound is located on the [place]. The wound measures 1cm length x 1.9cm width x 0.6cm depth; 1.492cm^2 area and 0.895cm^3 volume. There is tendon  and Fat Layer (Subcutaneous Tissue) exposed. There is no tunneling or undermining noted. There is a medium amount of serosanguineous drainage noted. The wound margin is well defined and not attached to the wound base. There is medium (34-66%) pink granulation within the wound bed. There is a medium (34-66%) amount of necrotic tissue within the wound bed including Adherent Slough. Wound #3 status is Open. Original cause of wound was Blister. The wound is located on the Left Metatarsal head first. The wound measures 0.8cm length x 0.8cm width x 0.4cm depth; 0.503cm^2 area and 0.201cm^3 volume. There is Fat Layer (Subcutaneous Tissue) exposed. There is no tunneling or undermining noted. There is a small amount of serosanguineous drainage noted. The wound margin is well defined and not attached to the wound base. There is small (1- 33%) pink granulation within the wound bed. There is a large (67-100%) amount of necrotic tissue within the wound bed including Adherent Slough. 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 There was a Excisional Skin/Subcutaneous Tissue/Muscle Debridement with a total area of 7.2 sq cm performed by Ricard Dillon., MD. With the following instrument(s): Curette to remove Viable and Non-Viable tissue/material. Material removed includes Muscle, Subcutaneous Tissue, Slough, Skin: Dermis, Skin: Epidermis, and Fibrin/Exudate after achieving pain control using Lidocaine 4% T opical Solution. A time out was conducted at 16:35, prior to the start of the procedure. A Moderate  amount of bleeding was controlled with Pressure. The procedure was tolerated well with a pain level of 0 throughout and a pain level of 0 following the procedure. Post Debridement Measurements: 2.4cm length x 3cm width x 2.1cm depth; 11.875cm^3 volume. Character of Wound/Ulcer Post Debridement is improved. Severity of Tissue Post Debridement is: Muscle involvement without necrosis. Post procedure Diagnosis Wound #1: Same as Pre-Procedure Wound #2 Pre-procedure diagnosis of Wound #  2 is a Diabetic Wound/Ulcer of the Lower Extremity located on the [place] .Severity of Tissue Pre Debridement is: Fat layer exposed. There was a Excisional Skin/Subcutaneous Tissue Debridement with a total area of 1.9 sq cm performed by Ricard Dillon., MD. With the following instrument(s): Curette to remove Viable and Non-Viable tissue/material. Material removed includes Subcutaneous Tissue, Slough, Skin: Dermis, Skin: Epidermis, and Fibrin/Exudate after achieving pain control using Lidocaine 4% Topical Solution. A time out was conducted at 16:35, prior to the start of the procedure. A Moderate amount of bleeding was controlled with Pressure. The procedure was tolerated well with a pain level of 0 throughout and a pain level of 0 following the procedure. Post Debridement Measurements: 1cm length x 1.9cm width x 0.6cm depth; 0.895cm^3 volume. Character of Wound/Ulcer Post Debridement is improved. Severity of Tissue Post Debridement is: Fat layer exposed. Post procedure Diagnosis Wound #2: Same as Pre-Procedure Wound #3 Pre-procedure diagnosis of Wound #3 is a Diabetic Wound/Ulcer of the Lower Extremity located on the Left Metatarsal head first .Severity of Tissue Pre Debridement is: Fat layer exposed. There was a Excisional Skin/Subcutaneous Tissue Debridement with a total area of 0.64 sq cm performed by Ricard Dillon., MD. With the following instrument(s): Curette to remove Viable and Non-Viable  tissue/material. Material removed includes Subcutaneous Tissue, Slough, Skin: Dermis, Skin: Epidermis, and Fibrin/Exudate after achieving pain control using Lidocaine 4% Topical Solution. A time out was conducted at 16:35, prior to the start of the procedure. A Moderate amount of bleeding was controlled with Pressure. The procedure was tolerated well with a pain level of 0 throughout and a pain level of 0 following the procedure. Post Debridement Measurements: 0.8cm length x 0.8cm width x 0.4cm depth; 0.201cm^3 volume. Character of Wound/Ulcer Post Debridement is improved. Severity of Tissue Post Debridement is: Fat layer exposed. Post procedure Diagnosis Wound #3: Same as Pre-Procedure Plan Follow-up Appointments: Return Appointment in 1 week. Dressing Change Frequency: Change dressing three times week. - Twice a week home health. wound center weekly. Wound Cleansing: Clean wound with Wound Cleanser - or normal saline - all wounds Primary Wound Dressing: Wound #1 Left,Proximal,Plantar Foot: Silver Collagen - moisten with hydrogel or [place] Jelly. Wound #2 Left,Distal,Plantar Foot: Silver Collagen - moisten with hydrogel or [place] Jelly. Wound #3 Left Metatarsal head first: Silver Collagen - moisten with hydrogel or [place] Jelly. Secondary Dressing: Kerlix/Rolled Gauze Dry Gauze ABD pad Edema Control: Avoid standing for long periods of time Elevate legs to the level of the heart or above for 30 minutes daily and/or when sitting, a frequency of: - throughout the day. Off-Loading: Open toe surgical shoe to: - left foot. ensure no pressure to bottom of left foot. use arms to push wheelchair around not your foot. Home Health: [facility] for [facility] care. - [facility] twice a week home health. wound center weekly. 1. We are addressing this with silver collagen to all 3 wound areas. 2. I am not convinced that there is underlying osteomyelitis here based on the MRI and  the previous inflammatory markers although I may consider repeating the inflammatory markers at some point. 3. The wound condition especially the more distal is not conducive to any advanced treatment option. I did an aggressive debridement of the more distal wound and I am still relying on silver collagen and a autologous debridement to give me continued better surface. 4. 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 I asked her to avoid using that foot to propel her wheelchair and she claims she has been compliant with this using her upper arms. She only uses her foot for pivot transfers 5. She has Medicare but without a supplement therefore advanced treatment products co-pay would probably be prohibitively expensive Electronic Signature(s) Signed: 10/12/2019 8:12:45 AM By: Linton Ham MD Entered By: Linton Ham on 10/12/2019 08:11:49 -------------------------------------------------------------------------------- SuperBill Details Patient Name: Date of Service: [name] 10/11/2019 Medical Record Number: [id number] Patient Account Number: [id number] Date of Birth/Sex: Treating RN: [id number] (54 y.o. 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.511 Acquired absence of right leg below knee M86.672 Other chronic osteomyelitis, left ankle and foot Facility Procedures CPT4 Code: [id number] Description: 94854 - DEB SUBQ TISSUE 20 SQ CM/< ICD-10 Diagnosis Description L97.523 Non-pressure chronic ulcer of other part of left foot with necrosis of  muscle E11.621 Type 2 diabetes mellitus with foot ulcer Modifier: Quantity: 1 CPT4 Code: [id number] Description: 93818 - DEB MUSC/FASCIA 20 SQ CM/< ICD-10 Diagnosis Description E11.621 Type 2 diabetes mellitus with foot ulcer L97.523 Non-pressure chronic ulcer of other part of left foot with necrosis of muscle Modifier: Quantity: 1 Physician Procedures : CPT4 Code Description Modifier [id number] - WC PHYS SUBQ TISS 20 SQ CM ICD-10 Diagnosis Description L97.523 Non-pressure chronic ulcer of other part of left foot with necrosis of muscle E11.621 Type 2 diabetes mellitus with foot ulcer [id number]  - WC PHYS DEBR MUSCLE/FASCIA 20 SQ CM 1 ICD-10 Diagnosis Description E11.621 Type 2 diabetes mellitus with foot ulcer L97.523 Non-pressure chronic ulcer of other part of left foot with necrosis of muscle Quantity: 1 Electronic Signature(s) Signed: 10/12/2019 8:12:45 AM By: Linton Ham MD Previous Signature: 10/11/2019 6:22:31 PM Version By: Deon Pilling Entered By: Linton Ham on 10/12/2019 08:12:01

## 2019-10-16 ENCOUNTER — Telehealth: Payer: Self-pay

## 2019-10-16 NOTE — Telephone Encounter (Signed)
Nurse called wanting verbal for continue PT.   Once a week for 9 weeks, three PRN visits, for general education.  Will fax order to be signed

## 2019-10-18 ENCOUNTER — Encounter (HOSPITAL_BASED_OUTPATIENT_CLINIC_OR_DEPARTMENT_OTHER): Payer: Medicare Other | Admitting: Internal Medicine

## 2019-10-23 ENCOUNTER — Other Ambulatory Visit: Payer: Self-pay

## 2019-10-23 ENCOUNTER — Encounter (HOSPITAL_BASED_OUTPATIENT_CLINIC_OR_DEPARTMENT_OTHER): Payer: Medicare Other | Admitting: Internal Medicine

## 2019-10-23 DIAGNOSIS — E11621 Type 2 diabetes mellitus with foot ulcer: Secondary | ICD-10-CM | POA: Diagnosis not present

## 2019-10-23 NOTE — Progress Notes (Signed)
[name], [name] ([id number]) Visit Report for 10/23/2019 Arrival Information Details Patient Name: Date of Service: [place] [name], [name] 10/23/2019 2:45 PM Medical Record Number: [id number] Patient Account Number: [id number] Date of Birth/Sex: Treating RN: Sep 12, 1965 (54 y.o. [name] [name] [name]: [name] [name]: [name] [name]: [name] [name]/[name]: [name], [name] in Treatment: 7 Visit Information History Since Last Visit Added or deleted any medications: No Patient Arrived: Wheel Chair Any new allergies or adverse reactions: No Arrival Time: 15:03 Had a fall or experienced change in No Accompanied By: self activities of daily living that may affect Transfer Assistance: None risk of falls: Patient Identification Verified: Yes Signs or symptoms of abuse/neglect since No Secondary Verification Process Completed: Yes last visito Patient Requires Transmission-Based Precautions: No Hospitalized since last visit: No Patient Has Alerts: No Implantable device outside of the clinic No excluding cellular tissue based products placed in the center since last visit: Has Dressing in Place as Prescribed: Yes Has Footwear/Offloading in Place as Yes Prescribed: Left: Surgical Wolf with Pressure Relief Insole Pain Present Now: No Notes stays in wheelchair Electronic Signature(s) Signed: 10/23/2019 4:56:50 PM By: Baruch Gouty RN, [name] Entered By: Baruch Gouty on 10/23/2019 15:23:14 -------------------------------------------------------------------------------- Encounter Discharge Information Details Patient Name: Date of Service: [name]. 10/23/2019 2:45 PM Medical Record Number: [id number] Patient Account Number: [id number] Date of Birth/Sex: Treating RN: 1965/09/16 (54 y.o. [name] [name] [name]: [name] [name]: [name] Laina Guerrieri: [name] [name]/[name]:  [name], [name] in Treatment: 7 Encounter Discharge Information Items Post Procedure Vitals Discharge Condition: Stable Temperature (F): 98.5 Ambulatory Status: Wheelchair Pulse (bpm): 79 Discharge Destination: Home Respiratory Rate (breaths/min): 18 Transportation: Cheryl Blood Pressure (mmHg): 139/74 Accompanied By: self Schedule Follow-up Appointment: Yes Clinical Summary of Wolf: Patient Declined Notes transportation service Electronic Signature(s) Signed: 10/23/2019 4:56:50 PM By: Baruch Gouty RN, [name] Entered By: Baruch Gouty on 10/23/2019 16:23:18 -------------------------------------------------------------------------------- Lower Extremity Assessment Details Patient Name: Date of Service: [name], [name] 10/23/2019 2:45 PM Medical Record Number: [id number] Patient Account Number: [id number] Date of Birth/Sex: Treating RN: [birth year] (54 y.o. [name] [name] [name]: [name] [name]: [name] [name]: [name] Tahj Lindseth/[name]: [name], [name] in Treatment: 7 Edema Assessment Assessed: [Left: No] [Right: No] Edema: [Left: Ye] [Right: s] Calf Left: Right: Point of Measurement: From Medial Instep 37.8 cm Ankle Left: Right: Point of Measurement: From Medial Instep 26.4 cm Vascular Assessment Pulses: Dorsalis Pedis Palpable: [Left:Yes] Electronic Signature(s) Signed: 10/23/2019 4:56:50 PM By: Baruch Gouty RN, [name] Entered By: Baruch Gouty on 10/23/2019 15:09:25 -------------------------------------------------------------------------------- Multi Wound Chart Details Patient Name: Date of Service: [name] 10/23/2019 2:45 PM Medical Record Number: [id number] Patient Account Number: [id number] Date of Birth/Sex: Treating RN: 02-Sep-1965 (54 y.o. [name], [name] [name] [name]: [name] [name]: [name] [name]: [name]  [name]/[name]: [name], [name] in Treatment: 7 Vital Signs Height(in): 40 Pulse(bpm): 86 Weight(lbs): 200 Blood Pressure(mmHg): 139/74 Body Mass Index(BMI): 31 Temperature(F): 98.5 Respiratory Rate(breaths/min): 18 Photos: Photos: [1:No Photos Left, Proximal, Plantar Foot] [2:No Photos Left, Distal, Plantar Foot] [3:Left Metatarsal head first] Wound Location: [1:Blister] [2:Blister] [3:Blister] Wounding Event: [1:Diabetic Wound/Ulcer of the Lower] [2:Diabetic Wound/Ulcer of the Lower] [3:Diabetic Wound/Ulcer of the Lower] Cheryl Etiology: [1:Extremity Cataracts, Anemia, Congestive Heart] [2:Extremity Cataracts, Anemia, Congestive Heart] [3:Extremity Cataracts, Anemia, Congestive Heart] Comorbid History: [1:Failure, Hypertension, Type II Diabetes, Osteomyelitis, Neuropathy 08/02/2019] [2:Failure, Hypertension, Type II Diabetes, Osteomyelitis, Neuropathy 08/02/2019] [3:Failure, Hypertension, Type II  Diabetes, Osteomyelitis, Neuropathy  08/02/2019] Date Acquired: [1:7] [2:7] [3:7] Weeks of Treatment: [1:Open] [2:Open] [3:Open] Wound Status: [1:2.3x3.5x1.8] [2:1x2.3x0.4] [3:0.6x0.7x0.1] Measurements L x W x D (cm) [1:6.322] [2:1.806] [3:0.33] A (cm) : rea [1:11.38] [2:0.723] [3:0.033] Volume (cm) : [1:-43.70%] [2:23.30%] [3:56.20%] % Reduction in A rea: [1:-7.80%] [2:69.30%] [3:93.80%] % Reduction in Volume: [1:3] [3:12] Starting Position 1 (o'clock): [1:6] [3:12] Ending Position 1 (o'clock): [1:1.6] [3:0.2] Maximum Distance 1 (cm): [1:Yes] [2:No] [3:Yes] Undermining: [1:Grade 2] [2:Grade 2] [3:Grade 3] Classification: [1:Medium] [2:Medium] [3:Small] Exudate A mount: [1:Purulent] [2:Serosanguineous] [3:Serosanguineous] Exudate Type: [1:yellow, brown, green] [2:red, brown] [3:red, brown] Exudate Color: [1:Yes] [2:No] [3:No] Foul Odor A Cleansing: [1:fter No] [2:N/A] [3:N/A] Odor A nticipated Due to Product Use: [1:Well defined, not attached] [2:Epibole]  [3:Well defined, not attached] Wound Margin: [1:None Present (0%)] [2:Small (1-33%)] [3:Large (67-100%)] Granulation A mount: [1:N/A] [2:Pink, Pale] [3:Red] Granulation Quality: [1:Large (67-100%)] [2:Large (67-100%)] [3:None Present (0%)] Necrotic A mount: [1:Fat Layer (Subcutaneous Tissue): Yes Fat Layer (Subcutaneous Tissue): Yes Fat Layer (Subcutaneous Tissue): Yes] Exposed Structures: [1:Muscle: Yes Fascia: No Tendon: No Joint: No Bone: No None] [2:Fascia: No Tendon: No Muscle: No Joint: No Bone: No None] [3:Fascia: No Tendon: No Muscle: No Joint: No Bone: No None] Epithelialization: [1:Debridement - Excisional] [2:Debridement - Excisional] [3:Debridement - Excisional] Debridement: Pre-procedure Verification/Time Out 15:44 [2:15:44] [3:15:44] Taken: [1:Lidocaine 4% T opical Solution] [2:Lidocaine 4% T opical Solution] [3:Lidocaine 4% T opical Solution] Pain Control: [1:Subcutaneous, Slough] [2:Subcutaneous, Slough] [3:Subcutaneous, Slough] Tissue Debrided: [1:Skin/Subcutaneous Tissue] [2:Skin/Subcutaneous Tissue] [3:Skin/Subcutaneous Tissue] Level: [1:8.05] [2:2.3] [3:0.42] Debridement A (sq cm): [1:rea Curette] [2:Curette] [3:Curette] Instrument: [1:Swab] [2:N/A] [3:N/A] Specimen: [1:1] [2:1] [3:1] Number of Specimens Taken: [1:Moderate] [2:Moderate] [3:Moderate] Bleeding: [1:Silver Nitrate] [2:Silver Nitrate] [3:Silver Nitrate] Hemostasis A chieved: [1:0] [2:0] [3:0] Procedural Pain: [1:0] [2:0] [3:0] Post Procedural Pain: [1:Procedure was tolerated well] [2:Procedure was tolerated well] [3:Procedure was tolerated well] Debridement Treatment Response: [1:2.3x3.5x1.8] [2:1x2.3x0.4] [3:0.6x0.7x0.1] Post Debridement Measurements L x W x D (cm) [1:11.38] [5:6.812] [3:0.033] Post Debridement Volume: (cm) [1:Debridement] [2:Debridement] [3:Debridement] Treatment Notes Electronic Signature(s) Signed: 10/23/2019 4:22:08 PM By: Linton Ham MD Signed: 10/23/2019 5:04:07 PM By: Deon Pilling Entered By: Linton Ham on 10/23/2019 16:08:34 -------------------------------------------------------------------------------- Multi-Disciplinary Wolf Plan Details Patient Name: Date of Service: [name], [name] 10/23/2019 2:45 PM Medical Record Number: [id number] Patient Account Number: [id number] Date of Birth/Sex: Treating RN: 12/27/1965 (54 y.o. [name], [name] [name] Laury Huizar: [name] [name]: [name] Josph Norfleet: [name] Elody Kleinsasser/[name]: [name], [name] in Treatment: 7 Active Inactive Abuse / Safety / Falls / Self Wolf Management Nursing Diagnoses: Potential for falls Potential for injury related to falls Goals: Patient will not experience any injury related to falls Date Initiated: 08/30/2019 Target Resolution Date: 12/07/2019 Goal Status: Active Patient/caregiver will verbalize/demonstrate measures taken to prevent injury and/or falls Date Initiated: 08/30/2019 Date Inactivated: 09/27/2019 Target Resolution Date: 09/28/2019 Goal Status: Met Interventions: Assess Activities of Daily Living upon admission and as needed Assess fall risk on admission and as needed Assess: immobility, friction, shearing, incontinence upon admission and as needed Assess impairment of mobility on admission and as needed per policy Assess personal safety and home safety (as indicated) on admission and as needed Assess self Wolf needs on admission and as needed Provide education on fall prevention Provide education on personal and home safety Notes: Nutrition Nursing Diagnoses: Impaired glucose control: actual or potential Potential for alteratiion in Nutrition/Potential for imbalanced nutrition Goals: Patient/caregiver agrees to and verbalizes understanding of need to use nutritional supplements and/or vitamins as  prescribed Date Initiated: 08/30/2019 Date Inactivated: 10/11/2019 Target Resolution Date: 10/05/2019 Goal Status:  Met Patient/caregiver will maintain therapeutic glucose control Date Initiated: 08/30/2019 Target Resolution Date: 12/07/2019 Goal Status: Active Interventions: Assess HgA1c results as ordered upon admission and as needed Assess patient nutrition upon admission and as needed per policy Provide education on elevated blood sugars and impact on wound healing Provide education on nutrition Treatment Activities: Education provided on Nutrition : 09/11/2019 Notes: Wound/Skin Impairment Nursing Diagnoses: Impaired tissue integrity Knowledge deficit related to ulceration/compromised skin integrity Goals: Patient/caregiver will verbalize understanding of skin Wolf regimen Date Initiated: 08/30/2019 Target Resolution Date: 12/07/2019 Goal Status: Active Interventions: Assess patient/caregiver ability to obtain necessary supplies Assess patient/caregiver ability to perform ulcer/skin Wolf regimen upon admission and as needed Assess ulceration(s) every visit Provide education on ulcer and skin Wolf Notes: Electronic Signature(s) Signed: 10/23/2019 5:04:07 PM By: Deon Pilling Entered By: Deon Pilling on 10/23/2019 15:43:35 -------------------------------------------------------------------------------- Pain Assessment Details Patient Name: Date of Service: [name], [name] 10/23/2019 2:45 PM Medical Record Number: [id number] Patient Account Number: [id number] Date of Birth/Sex: Treating RN: 03/13/1965 (53 y.o. [name] [name] Mariel Gaudin: [name] [name]: [name] Ayelet Gruenewald: [name] Keshana Klemz/[name]: [name], [name] in Treatment: 7 Active Problems Location of Pain Severity and Description of Pain Patient Has Paino No Site Locations Rate the pain. Current Pain Level: 0 Pain Management and Medication Current Pain Management: Electronic Signature(s) Signed: 10/23/2019 4:56:50 PM By: Baruch Gouty RN, [name] Entered By: Baruch Gouty on 10/23/2019 15:06:14 -------------------------------------------------------------------------------- Patient/Caregiver Education Details Patient Name: Date of Service: [name] 10/19/2021andnbsp2:45 PM Medical Record Number: [id number] Patient Account Number: [id number] Date of Birth/Gender: Treating RN: Dec 03, 1965 (54 y.o. [name] [name] Physician: [name] [name]: [name] Physician: [name] Physician/[name]: Sonda Primes in Treatment: 7 Education Assessment Education Provided To: Patient Education Topics Provided Infection: Handouts: CDC antimicrobial patient education_English, Infection Prevention and Management Methods: Explain/Verbal Responses: Reinforcements needed Electronic Signature(s) Signed: 10/23/2019 5:04:07 PM By: Deon Pilling Entered By: Deon Pilling on 10/23/2019 16:35:20 -------------------------------------------------------------------------------- Wound Assessment Details Patient Name: Date of Service: [name], [name] 10/23/2019 2:45 PM Medical Record Number: [id number] Patient Account Number: [id number] Date of Birth/Sex: Treating RN: 1965-06-10 (54 y.o. [name] [name] Kataleia Quaranta: [name] [name]: [name] Vikkie Goeden: [name] Alveda Vanhorne/[name]: [name], [name] in Treatment: 7 Wound Status Wound Number: 1 Cheryl Diabetic Wound/Ulcer of the Lower Extremity Etiology: Wound Location: Left, Proximal, Plantar Foot Wound Open Wounding Event: Blister Status: Date Acquired: 08/02/2019 Comorbid Cataracts, Anemia, Congestive Heart Failure, Hypertension, Type Weeks Of Treatment: 7 History: II Diabetes, Osteomyelitis, Neuropathy Clustered Wound: No Wound Measurements Length: (cm) 2.3 Width: (cm) 3.5 Depth: (cm) 1.8 Area: (cm) 6.322 Volume: (cm) 11.38 % Reduction in Area: -43.7% % Reduction in Volume:  -7.8% Epithelialization: None Tunneling: No Undermining: Yes Starting Position (o'clock): 3 Ending Position (o'clock): 6 Maximum Distance: (cm) 1.6 Wound Description Classification: Grade 2 Wound Margin: Well defined, not attached Exudate Amount: Medium Exudate Type: Purulent Exudate Color: yellow, brown, green Foul Odor After Cleansing: Yes Due to Product Use: No Slough/Fibrino Yes Wound Bed Granulation Amount: None Present (0%) Exposed Structure Necrotic Amount: Large (67-100%) Fascia Exposed: No Necrotic Quality: Adherent Slough Fat Layer (Subcutaneous Tissue) Exposed: Yes Tendon Exposed: No Muscle Exposed: Yes Necrosis of Muscle: Yes Joint Exposed: No Bone Exposed: No Treatment Notes Wound #1 (Left, Proximal, Plantar Foot) 2. Periwound Wolf Moisturizing lotion 3. Cheryl Dressing Applied Calcium Alginate Ag 4. Secondary  Dressing ABD Pad Dry Gauze Roll Gauze 7. Footwear/Offloading device applied Surgical Wolf Electronic Signature(s) Signed: 10/23/2019 4:56:50 PM By: Baruch Gouty RN, [name] Entered By: Baruch Gouty on 10/23/2019 15:21:45 -------------------------------------------------------------------------------- Wound Assessment Details Patient Name: Date of Service: [name], [name] 10/23/2019 2:45 PM Medical Record Number: [id number] Patient Account Number: [id number] Date of Birth/Sex: Treating RN: [birth year] (54 y.o. [name], Linda [name] Enis Riecke: [name] [name]: [name] Ola Fawver: [name] Patriciaann Rabanal/[name]: [name], [name] in Treatment: 7 Wound Status Wound Number: 2 Cheryl Diabetic Wound/Ulcer of the Lower Extremity Etiology: Wound Location: Left, Distal, Plantar Foot Wound Open Wounding Event: Blister Status: Date Acquired: 08/02/2019 Comorbid Cataracts, Anemia, Congestive Heart Failure, Hypertension, Type Weeks Of Treatment: 7 History: II Diabetes, Osteomyelitis,  Neuropathy Clustered Wound: No Wound Measurements Length: (cm) 1 Width: (cm) 2.3 Depth: (cm) 0.4 Area: (cm) 1.806 Volume: (cm) 0.723 % Reduction in Area: 23.3% % Reduction in Volume: 69.3% Epithelialization: None Tunneling: No Undermining: No Wound Description Classification: Grade 2 Wound Margin: Epibole Exudate Amount: Medium Exudate Type: Serosanguineous Exudate Color: red, brown Foul Odor After Cleansing: No Slough/Fibrino Yes Wound Bed Granulation Amount: Small (1-33%) Exposed Structure Granulation Quality: Pink, Pale Fascia Exposed: No Necrotic Amount: Large (67-100%) Fat Layer (Subcutaneous Tissue) Exposed: Yes Necrotic Quality: Adherent Slough Tendon Exposed: No Muscle Exposed: No Joint Exposed: No Bone Exposed: No Treatment Notes Wound #2 (Left, Distal, Plantar Foot) 2. Periwound Wolf Moisturizing lotion 3. Cheryl Dressing Applied Calcium Alginate Ag 4. Secondary Dressing ABD Pad Dry Gauze Roll Gauze 7. Footwear/Offloading device applied Surgical Wolf Electronic Signature(s) Signed: 10/23/2019 4:56:50 PM By: Baruch Gouty RN, [name] Entered By: Baruch Gouty on 10/23/2019 15:22:17 -------------------------------------------------------------------------------- Wound Assessment Details Patient Name: Date of Service: [name], [name] 10/23/2019 2:45 PM Medical Record Number: [id number] Patient Account Number: [id number] Date of Birth/Sex: Treating RN: 12/17/1965 (54 y.o. [name] [name] Dotty Gonzalo: [name] [name]: [name] Faraz Ponciano: [name] Karsyn Jamie/[name]: [name], [name] in Treatment: 7 Wound Status Wound Number: 3 Cheryl Diabetic Wound/Ulcer of the Lower Extremity Etiology: Wound Location: Left Metatarsal head first Wound Open Wounding Event: Blister Status: Date Acquired: 08/02/2019 Comorbid Cataracts, Anemia, Congestive Heart Failure, Hypertension, Type Weeks Of Treatment:  7 History: II Diabetes, Osteomyelitis, Neuropathy Clustered Wound: No Wound Measurements Length: (cm) 0.6 Width: (cm) 0.7 Depth: (cm) 0.1 Area: (cm) 0.33 Volume: (cm) 0.033 % Reduction in Area: 56.2% % Reduction in Volume: 93.8% Epithelialization: None Tunneling: No Undermining: Yes Starting Position (o'clock): 12 Ending Position (o'clock): 12 Maximum Distance: (cm) 0.2 Wound Description Classification: Grade 3 Wound Margin: Well defined, not attached Exudate Amount: Small Exudate Type: Serosanguineous Exudate Color: red, brown Foul Odor After Cleansing: No Slough/Fibrino No Wound Bed Granulation Amount: Large (67-100%) Exposed Structure Granulation Quality: Red Fascia Exposed: No Necrotic Amount: None Present (0%) Fat Layer (Subcutaneous Tissue) Exposed: Yes Tendon Exposed: No Muscle Exposed: No Joint Exposed: No Bone Exposed: No Treatment Notes Wound #3 (Left Metatarsal head first) 2. Periwound Wolf Moisturizing lotion 3. Cheryl Dressing Applied Calcium Alginate Ag 4. Secondary Dressing ABD Pad Dry Gauze Roll Gauze 7. Footwear/Offloading device applied Surgical Wolf Electronic Signature(s) Signed: 10/23/2019 4:56:50 PM By: Baruch Gouty RN, [name] Entered By: Baruch Gouty on 10/23/2019 15:22:42 -------------------------------------------------------------------------------- Vitals Details Patient Name: Date of Service: [name], [name] 10/23/2019 2:45 PM Medical Record Number: [id number] Patient Account Number: [id number] Date of Birth/Sex: Treating RN: 1965-09-20 (54 y.o. [name] [name] Junelle Hashemi: [name] [name]: [name] Finnbar Cedillos: [name] Liylah Najarro/[name]: [name],  Crystal Weeks in Treatment: 7 Vital Signs Time Taken: 15:05 Temperature (F): 98.5 Height (in): 67 Pulse (bpm): 79 Source: Stated Respiratory Rate (breaths/min): 18 Weight (lbs): 200 Blood Pressure (mmHg): 139/74 Source:  Stated Reference Range: 80 - 120 mg / dl Body Mass Index (BMI): 31.3 Electronic Signature(s) Signed: 10/23/2019 4:56:50 PM By: Baruch Gouty RN, [name] Entered By: Baruch Gouty on 10/23/2019 15:06:05

## 2019-10-23 NOTE — Progress Notes (Signed)
[name], [name] ([id number]) Visit Report for 10/23/2019 Debridement Details Patient Name: Date of Service: [name], [name] 10/23/2019 2:45 PM Medical Record Number: [id number] Patient Account Number: [id number] Date of Birth/Sex: Treating RN: April 16, 1965 (54 y.o. [name], [name] Primary Care Provider: Dionisio Wolf Other Clinician: Referring Provider: Treating Provider/Extender: [name], [name] in Treatment: 7 Debridement Performed for Assessment: Wound #1 Left,Proximal,Plantar Foot Performed By: Physician [name]., MD Debridement Type: Debridement Severity of Tissue Pre Debridement: Fat layer exposed Level of Consciousness (Pre-procedure): Awake and Alert Pre-procedure Verification/Time Out Yes - 15:44 Taken: Start Time: 15:45 Pain Control: Lidocaine 4% T opical Solution T Area Debrided (L x W): otal 2.3 (cm) x 3.5 (cm) = 8.05 (cm) Tissue and other material debrided: Viable, Non-Viable, Slough, Subcutaneous, Skin: Dermis , Fibrin/Exudate, Slough Level: Skin/Subcutaneous Tissue Debridement Description: Excisional Instrument: Curette Specimen: Swab, Number of Specimens T aken: 1 Bleeding: Moderate Hemostasis Achieved: Silver Nitrate End Time: 15:52 Procedural Pain: 0 Post Procedural Pain: 0 Response to Treatment: Procedure was tolerated well Level of Consciousness (Post- Awake and Alert procedure): Post Debridement Measurements of Total Wound Length: (cm) 2.3 Width: (cm) 3.5 Depth: (cm) 1.8 Volume: (cm) 11.38 Character of Wound/Ulcer Post Debridement: Requires Further Debridement Severity of Tissue Post Debridement: Fat layer exposed Post Procedure Diagnosis Same as Pre-procedure Electronic Signature(s) Signed: 10/23/2019 4:22:08 PM By: Cheryl Ham MD Signed: 10/23/2019 5:04:07 PM By: [name] Entered By: [name] on 10/23/2019  16:08:48 -------------------------------------------------------------------------------- Debridement Details Patient Name: Date of Service: [name], [name] 10/23/2019 2:45 PM Medical Record Number: [id number] Patient Account Number: [id number] Date of Birth/Sex: Treating RN: June 16, 1965 (54 y.o. [name] Primary Care Provider: Dionisio Wolf Other Clinician: Referring Provider: Treating Provider/Extender: [name], [name] in Treatment: 7 Debridement Performed for Assessment: Wound #2 Left,Distal,Plantar Foot Performed By: Physician [name]., MD Debridement Type: Debridement Severity of Tissue Pre Debridement: Fat layer exposed Level of Consciousness (Pre-procedure): Awake and Alert Pre-procedure Verification/Time Out Yes - 15:44 Taken: Start Time: 15:45 Pain Control: Lidocaine 4% T opical Solution T Area Debrided (L x W): otal 1 (cm) x 2.3 (cm) = 2.3 (cm) Tissue and other material debrided: Viable, Non-Viable, Slough, Subcutaneous, Skin: Dermis , Fibrin/Exudate, Slough Level: Skin/Subcutaneous Tissue Debridement Description: Excisional Instrument: Curette Bleeding: Moderate Hemostasis Achieved: Silver Nitrate End Time: 15:52 Procedural Pain: 0 Post Procedural Pain: 0 Response to Treatment: Procedure was tolerated well Level of Consciousness (Post- Awake and Alert procedure): Post Debridement Measurements of Total Wound Length: (cm) 1 Width: (cm) 2.3 Depth: (cm) 0.4 Volume: (cm) 0.723 Character of Wound/Ulcer Post Debridement: Requires Further Debridement Severity of Tissue Post Debridement: Fat layer exposed Post Procedure Diagnosis Same as Pre-procedure Electronic Signature(s) Signed: 10/23/2019 4:22:08 PM By: Cheryl Ham MD Signed: 10/23/2019 5:04:07 PM By: [name] Entered By: [name] on 10/23/2019 16:08:59 -------------------------------------------------------------------------------- Debridement  Details Patient Name: Date of Service: [name], [name] 10/23/2019 2:45 PM Medical Record Number: [id number] Patient Account Number: [id number] Date of Birth/Sex: Treating RN: [birth year] (54 y.o. [name] Primary Care Provider: Dionisio Wolf Other Clinician: Referring Provider: Treating Provider/Extender: [name], [name] in Treatment: 7 Debridement Performed for Assessment: Wound #3 Left Metatarsal head first Performed By: Physician [name]., MD Debridement Type: Debridement Severity of Tissue Pre Debridement: Fat layer exposed Level of Consciousness (Pre-procedure): Awake and Alert Pre-procedure Verification/Time Out Yes - 15:44 Taken: Start Time: 15:45 Pain Control: Lidocaine 4% T opical Solution T Area Debrided (L x W): otal 0.6 (  cm) x 0.7 (cm) = 0.42 (cm) Tissue and other material debrided: Viable, Non-Viable, Slough, Subcutaneous, Skin: Dermis , Fibrin/Exudate, Slough Level: Skin/Subcutaneous Tissue Debridement Description: Excisional Instrument: Curette Bleeding: Moderate Hemostasis Achieved: Silver Nitrate End Time: 15:52 Procedural Pain: 0 Post Procedural Pain: 0 Response to Treatment: Procedure was tolerated well Level of Consciousness (Post- Awake and Alert procedure): Post Debridement Measurements of Total Wound Length: (cm) 0.6 Width: (cm) 0.7 Depth: (cm) 0.1 Volume: (cm) 0.033 Character of Wound/Ulcer Post Debridement: Requires Further Debridement Severity of Tissue Post Debridement: Fat layer exposed Post Procedure Diagnosis Same as Pre-procedure Electronic Signature(s) Signed: 10/23/2019 4:22:08 PM By: Cheryl Ham MD Signed: 10/23/2019 5:04:07 PM By: [name] Entered By: [name] on 10/23/2019 16:09:07 -------------------------------------------------------------------------------- HPI Details Patient Name: Date of Service: [name] 10/23/2019 2:45 PM Medical Record Number:  [id number] Patient Account Number: [id number] Date of Birth/Sex: Treating RN: 07-24-1965 (54 y.o. [name] Primary Care Provider: Dionisio Wolf Other Clinician: Referring Provider: Treating Provider/Extender: [name], [name] in Treatment: 7 History of Present Illness HPI Description: ADMISSION 08/30/2019 This is a 54 year old woman with type 2 diabetes and peripheral neuropathy. She developed a blister on the plantar left foot on 7/27. She was seen in the emergency room treated. This did not get any better and she required admission to the hospital from 08/02/2019 through 08/09/2019. She was felt to have cellulitis of the left foot. She was treated with IV medications/antibiotics discharged on Augmentin however she could not tolerate this and she was changed to doxycycline for 5 days she has completed this. She has advanced home care they are apparently putting Xeroform over the wound. When she left the hospital she was supposed to be putting Santyl over the black areas on the left plantar foot covered with Xeroform. X-rays done in the hospital did not show evidence of osteomyelitis only a superficial plantar ulceration. She had blood cultures but I do not see a wound culture. The patient has a past medical history of a right below-knee amputation by Dr. [name] in 2012 presumably for infection in this area although I am not certain. As noted she is a type II diabetic on oral agents with a recent hemoglobin A1c of 5.5. Other past medical history includes ITP, peripheral edema, chronic kidney disease stage III, hypertension, right below-knee amputation, diastolic heart failure. She tells me she is wheelchair-bound and is nonweightbearing although she does push the wheelchair along with the left foot. She is only using hospital sock on the left foot. ABI in our clinic was 1.06 on the left 9/7; the patient cultured Pseudomonas last week out of the deeper proximal wound. MRI  that was ordered showed progressive soft tissue ulceration along the plantar aspect of the foot especially along the medial arch but also plantar to the first metatarsal phalangeal joint no drainable abscess or foreign body. There was a suspicion for early osteomyelitis in the fifth metatarsal base but she does not have a wound in this area. Also problematically is that where we put her prescription for ciprofloxacin and they did not have the antibiotic and we verified that they are out of ciprofloxacin. We did not receive the message from them to this effect The patient did see Dr. [name] of podiatry. The wound was debrided. He prescribed Santyl which I do not think she is picked up either. Lab work including a CBC sedimentation rate and C-reactive protein were ordered. Finally she is using silver alginate she has home health changing  the dressing 3 times a week she does not disturb this in between times 9/23; we have not seen this lady in 2-1/2 weeks largely because of transportation issues. She still has 3 wound areas a deep area in the midfoot and area just above this and then an area on the first met head. She has been using silver alginate on the wound. They are trying to use home health however they are trying to teach her how to do this at be been very difficult for this patient to do so. In discussing things with her she uses this foot to propel her wheelchair I have asked her to stop doing this and to use her arms. She states that she is only moving around in her house. We will need to have a look at her lab work that I quoted from the last time she was here that was ordered by Dr. [name] of podiatry. By our MRI she does not have osteomyelitis in any of the wounded areas. 10/7 2-week follow-up. The patient has 3 wounds including superficially over the left first metatarsal head, left first submetatarsal in the lateral foot and a deep area more distally. Her MRI did not show osteomyelitis  in reference to the wounds that we are treating although did suggest an early possibility in the left fifth metatarsal head however there is no wound in this area. I have reviewed her inflammatory markers. Most recently on 7/26 she had a sedimentation rate of 117 although this was previously greater than 140 and then 108. Last CRP was on 8/20 at 5.1 comparing to previously at 4.27. Noteworthy that this is when she was in the hospital in July I believe. 10/19; patient missed her appointment last week because of transportation issues. We have been using silver collagen. She still has the area over the metatarsal head and the mid part of her foot which are superficial wounds about the same. Marked deterioration in the problematic distal wound. This now is right down into the fascial layer of her foot. Our intake nurse noted purulent drainage. She has a small satellite lesion towards the heel Electronic Signature(s) Signed: 10/23/2019 4:22:08 PM By: Cheryl Ham MD Entered By: [name] on 10/23/2019 16:10:16 -------------------------------------------------------------------------------- Physical Exam Details Patient Name: Date of Service: [name], [name] 10/23/2019 2:45 PM Medical Record Number: [id number] Patient Account Number: [id number] Date of Birth/Sex: Treating RN: Oct 25, 1965 (54 y.o. [name] Primary Care Provider: Dionisio Wolf Other Clinician: Referring Provider: Treating Provider/Extender: [name], [name] in Treatment: 7 Cardiovascular Pedal pulses are palpable. Notes Wound exam The superficial wounds look about the same. The deeper wound which was her surgical wound is not making progress. This is now down to the fascial layer of her foot there is nothing that looks viable here. It does not probe to bone but it simply does not look healthy. I used a #5 curette to remove necrotic tissue attempting to avoid tendon. Also debrided the more  superficial wounds first met head and medial foot these are more superficial wounds and not as problematic. Electronic Signature(s) Signed: 10/23/2019 4:22:08 PM By: Cheryl Ham MD Entered By: [name] on 10/23/2019 16:11:55 -------------------------------------------------------------------------------- Physician Orders Details Patient Name: Date of Service: [name], [name] 10/23/2019 2:45 PM Medical Record Number: [id number] Patient Account Number: [id number] Date of Birth/Sex: Treating RN: Mar 17, 1965 (54 y.o. [name] Primary Care Provider: Dionisio Wolf Other Clinician: Referring Provider: Treating Provider/Extender: [name], [name] in Treatment: 7  Verbal / Phone Orders: No Diagnosis Coding ICD-10 Coding Code Description E11.621 Type 2 diabetes mellitus with foot ulcer L97.523 Non-pressure chronic ulcer of other part of left foot with necrosis of muscle Z89.511 Acquired absence of right leg below knee M86.672 Other chronic osteomyelitis, left ankle and foot Follow-up Appointments ppointment in 1 week. - Thursday Return A Dressing Change Frequency Change dressing three times week. - Twice a week home health. wound center weekly. Wound Cleansing Clean wound with Wound Cleanser - or normal saline - all wounds Primary Wound Dressing Wound #1 Left,Proximal,Plantar Foot lginate with Silver - ensure to pack lightly into any depth. Calcium A Wound #2 Left,Distal,Plantar Foot Calcium Alginate with Silver Wound #3 Left Metatarsal head first Calcium Alginate with Silver Secondary Dressing Kerlix/Rolled Gauze Dry Gauze ABD pad Edema Control Avoid standing for long periods of time Elevate legs to the level of the heart or above for 30 minutes daily and/or when sitting, a frequency of: - throughout the day. Off-Loading Open toe surgical Wolf to: - left foot. ensure no pressure to bottom of left foot. use arms to push wheelchair  around not your foot. [facility] for wound care. - [facility] twice a week home health. wound center weekly. Laboratory naerobe culture (MICRO) - PCR culture of left foot proximal plantar foot wound. - (ICD10 Bacteria identified in Unspecified specimen by A E11.621 - Type 2 diabetes mellitus with foot ulcer) LOINC Code: 503-8 Convenience Name: Anerobic culture Electronic Signature(s) Signed: 10/23/2019 4:22:08 PM By: Cheryl Ham MD Signed: 10/23/2019 5:04:07 PM By: [name] Entered By: [name] on 10/23/2019 15:49:23 -------------------------------------------------------------------------------- Problem List Details Patient Name: Date of Service: Cheryl ISATU, MACINNES 10/23/2019 2:45 PM Medical Record Number: [id number] Patient Account Number: [id number] Date of Birth/Sex: Treating RN: [birth year] (55 y.o. [name] Primary Care Provider: Dionisio Wolf Other Clinician: Referring Provider: Treating Provider/Extender: [name], [name] in Treatment: 7 Active Problems ICD-10 Encounter Code Description Active Date MDM Diagnosis E11.621 Type 2 diabetes mellitus with foot ulcer 08/30/2019 No Yes L97.523 Non-pressure chronic ulcer of other part of left foot with necrosis of muscle 08/30/2019 No Yes Z89.511 Acquired absence of right leg below knee 08/30/2019 No Yes M86.672 Other chronic osteomyelitis, left ankle and foot 08/30/2019 No Yes Inactive Problems Resolved Problems Electronic Signature(s) Signed: 10/23/2019 4:22:08 PM By: Cheryl Ham MD Entered By: [name] on 10/23/2019 17:91:50 -------------------------------------------------------------------------------- Progress Note Details Patient Name: Date of Service: [name] 10/23/2019 2:45 PM Medical Record Number: [id number] Patient Account Number: [id number] Date of Birth/Sex: Treating RN: [birth year] (54 y.o. [name] Primary Care Provider: Dionisio Wolf Other Clinician: Referring Provider: Treating Provider/Extender: [name], [name] in Treatment: 7 Subjective History of Present Illness (HPI) ADMISSION 08/30/2019 This is a 54 year old woman with type 2 diabetes and peripheral neuropathy. She developed a blister on the plantar left foot on 7/27. She was seen in the emergency room treated. This did not get any better and she required admission to the hospital from 08/02/2019 through 08/09/2019. She was felt to have cellulitis of the left foot. She was treated with IV medications/antibiotics discharged on Augmentin however she could not tolerate this and she was changed to doxycycline for 5 days she has completed this. She has advanced home care they are apparently putting Xeroform over the wound. When she left the hospital she was supposed to be putting Santyl over the black areas on the left plantar foot covered  with Xeroform. X-rays done in the hospital did not show evidence of osteomyelitis only a superficial plantar ulceration. She had blood cultures but I do not see a wound culture. The patient has a past medical history of a right below-knee amputation by Dr. [name] in 2012 presumably for infection in this area although I am not certain. As noted she is a type II diabetic on oral agents with a recent hemoglobin A1c of 5.5. Other past medical history includes ITP, peripheral edema, chronic kidney disease stage III, hypertension, right below-knee amputation, diastolic heart failure. She tells me she is wheelchair-bound and is nonweightbearing although she does push the wheelchair along with the left foot. She is only using hospital sock on the left foot. ABI in our clinic was 1.06 on the left 9/7; the patient cultured Pseudomonas last week out of the deeper proximal wound. MRI that was ordered showed progressive soft tissue ulceration along the plantar aspect of the foot especially  along the medial arch but also plantar to the first metatarsal phalangeal joint no drainable abscess or foreign body. There was a suspicion for early osteomyelitis in the fifth metatarsal base but she does not have a wound in this area. Also problematically is that where we put her prescription for ciprofloxacin and they did not have the antibiotic and we verified that they are out of ciprofloxacin. We did not receive the message from them to this effect The patient did see Dr. [name] of podiatry. The wound was debrided. He prescribed Santyl which I do not think she is picked up either. Lab work including a CBC sedimentation rate and C-reactive protein were ordered. Finally she is using silver alginate she has home health changing the dressing 3 times a week she does not disturb this in between times 9/23; we have not seen this lady in 2-1/2 weeks largely because of transportation issues. She still has 3 wound areas a deep area in the midfoot and area just above this and then an area on the first met head. She has been using silver alginate on the wound. They are trying to use home health however they are trying to teach her how to do this at be been very difficult for this patient to do so. In discussing things with her she uses this foot to propel her wheelchair I have asked her to stop doing this and to use her arms. She states that she is only moving around in her house. We will need to have a look at her lab work that I quoted from the last time she was here that was ordered by Dr. [name] of podiatry. By our MRI she does not have osteomyelitis in any of the wounded areas. 10/7 2-week follow-up. The patient has 3 wounds including superficially over the left first metatarsal head, left first submetatarsal in the lateral foot and a deep area more distally. Her MRI did not show osteomyelitis in reference to the wounds that we are treating although did suggest an early possibility in the left  fifth metatarsal head however there is no wound in this area. I have reviewed her inflammatory markers. Most recently on 7/26 she had a sedimentation rate of 117 although this was previously greater than 140 and then 108. Last CRP was on 8/20 at 5.1 comparing to previously at 4.27. Noteworthy that this is when she was in the hospital in July I believe. 10/19; patient missed her appointment last week because of transportation issues. We have been using silver  collagen. She still has the area over the metatarsal head and the mid part of her foot which are superficial wounds about the same. Marked deterioration in the problematic distal wound. This now is right down into the fascial layer of her foot. Our intake nurse noted purulent drainage. She has a small satellite lesion towards the heel Objective Constitutional Vitals Time Taken: 3:05 PM, Height: 67 in, Source: Stated, Weight: 200 lbs, Source: Stated, BMI: 31.3, Temperature: 98.5 F, Pulse: 79 bpm, Respiratory Rate: 18 breaths/min, Blood Pressure: 139/74 mmHg. Cardiovascular Pedal pulses are palpable. General Notes: Wound exam ooThe superficial wounds look about the same. The deeper wound which was her surgical wound is not making progress. This is now down to the fascial layer of her foot there is nothing that looks viable here. It does not probe to bone but it simply does not look healthy. I used a #5 curette to remove necrotic tissue attempting to avoid tendon. Also debrided the more superficial wounds first met head and medial foot these are more superficial wounds and not as problematic. Integumentary (Hair, Skin) Wound #1 status is Open. Original cause of wound was Blister. The wound is located on the Left,Proximal,Plantar Foot. The wound measures 2.3cm length x 3.5cm width x 1.8cm depth; 6.322cm^2 area and 11.38cm^3 volume. There is muscle and Fat Layer (Subcutaneous Tissue) exposed. There is no tunneling noted, however, there is  undermining starting at 3:00 and ending at 6:00 with a maximum distance of 1.6cm. There is a medium amount of purulent drainage noted. Foul odor after cleansing was noted. The wound margin is well defined and not attached to the wound base. There is no granulation within the wound bed. There is a large (67-100%) amount of necrotic tissue within the wound bed including Adherent Slough and Necrosis of Muscle. Wound #2 status is Open. Original cause of wound was Blister. The wound is located on the [place]. The wound measures 1cm length x 2.3cm width x 0.4cm depth; 1.806cm^2 area and 0.723cm^3 volume. There is Fat Layer (Subcutaneous Tissue) exposed. There is no tunneling or undermining noted. There is a medium amount of serosanguineous drainage noted. The wound margin is epibole. There is small (1-33%) pink, pale granulation within the wound bed. There is a large (67-100%) amount of necrotic tissue within the wound bed including Adherent Slough. Wound #3 status is Open. Original cause of wound was Blister. The wound is located on the Left Metatarsal head first. The wound measures 0.6cm length x 0.7cm width x 0.1cm depth; 0.33cm^2 area and 0.033cm^3 volume. There is Fat Layer (Subcutaneous Tissue) exposed. There is no tunneling noted, however, there is undermining starting at 12:00 and ending at 12:00 with a maximum distance of 0.2cm. There is a small amount of serosanguineous drainage noted. The wound margin is well defined and not attached to the wound base. There is large (67-100%) red granulation within the wound bed. There is no necrotic tissue within the wound bed. Assessment Active Problems ICD-10 Type 2 diabetes mellitus with foot ulcer Non-pressure chronic ulcer of other part of left foot with necrosis of muscle Acquired absence of right leg below knee Other chronic osteomyelitis, left ankle and foot Procedures Wound #1 Pre-procedure diagnosis of Wound #1 is a Diabetic  Wound/Ulcer of the Lower Extremity located on the Left,Proximal,Plantar Foot .Severity of Tissue Pre Debridement is: Fat layer exposed. There was a Excisional Skin/Subcutaneous Tissue Debridement with a total area of 8.05 sq cm performed by [name]., MD. With the following  instrument(s): Curette to remove Viable and Non-Viable tissue/material. Material removed includes Subcutaneous Tissue, Slough, Skin: Dermis, and Fibrin/Exudate after achieving pain control using Lidocaine 4% T opical Solution. 1 specimen was taken by a Swab and sent to the lab per facility protocol. A time out was conducted at 15:44, prior to the start of the procedure. A Moderate amount of bleeding was controlled with Silver Nitrate. The procedure was tolerated well with a pain level of 0 throughout and a pain level of 0 following the procedure. Post Debridement Measurements: 2.3cm length x 3.5cm width x 1.8cm depth; 11.38cm^3 volume. Character of Wound/Ulcer Post Debridement requires further debridement. Severity of Tissue Post Debridement is: Fat layer exposed. Post procedure Diagnosis Wound #1: Same as Pre-Procedure Wound #2 Pre-procedure diagnosis of Wound #2 is a Diabetic Wound/Ulcer of the Lower Extremity located on the Left,Distal,Plantar Foot .Severity of Tissue Pre Debridement is: Fat layer exposed. There was a Excisional Skin/Subcutaneous Tissue Debridement with a total area of 2.3 sq cm performed by [name]., MD. With the following instrument(s): Curette to remove Viable and Non-Viable tissue/material. Material removed includes Subcutaneous Tissue, Slough, Skin: Dermis, and Fibrin/Exudate after achieving pain control using Lidocaine 4% T opical Solution. A time out was conducted at 15:44, prior to the start of the procedure. A Moderate amount of bleeding was controlled with Silver Nitrate. The procedure was tolerated well with a pain level of 0 throughout and a pain level of 0 following the procedure.  Post Debridement Measurements: 1cm length x 2.3cm width x 0.4cm depth; 0.723cm^3 volume. Character of Wound/Ulcer Post Debridement requires further debridement. Severity of Tissue Post Debridement is: Fat layer exposed. Post procedure Diagnosis Wound #2: Same as Pre-Procedure Wound #3 Pre-procedure diagnosis of Wound #3 is a Diabetic Wound/Ulcer of the Lower Extremity located on the Left Metatarsal head first .Severity of Tissue Pre Debridement is: Fat layer exposed. There was a Excisional Skin/Subcutaneous Tissue Debridement with a total area of 0.42 sq cm performed by [name]., MD. With the following instrument(s): Curette to remove Viable and Non-Viable tissue/material. Material removed includes Subcutaneous Tissue, Slough, Skin: Dermis, and Fibrin/Exudate after achieving pain control using Lidocaine 4% T opical Solution. A time out was conducted at 15:44, prior to the start of the procedure. A Moderate amount of bleeding was controlled with Silver Nitrate. The procedure was tolerated well with a pain level of 0 throughout and a pain level of 0 following the procedure. Post Debridement Measurements: 0.6cm length x 0.7cm width x 0.1cm depth; 0.033cm^3 volume. Character of Wound/Ulcer Post Debridement requires further debridement. Severity of Tissue Post Debridement is: Fat layer exposed. Post procedure Diagnosis Wound #3: Same as Pre-Procedure Plan Follow-up Appointments: Return Appointment in 1 week. - Thursday Dressing Change Frequency: Change dressing three times week. - Twice a week home health. wound center weekly. Wound Cleansing: Clean wound with Wound Cleanser - or normal saline - all wounds Primary Wound Dressing: Wound #1 Left,Proximal,Plantar Foot: Calcium Alginate with Silver - ensure to pack lightly into any depth. Wound #2 Left,Distal,Plantar Foot: Calcium Alginate with Silver Wound #3 Left Metatarsal head first: Calcium Alginate with Silver Secondary  Dressing: Kerlix/Rolled Gauze Dry Gauze ABD pad Edema Control: Avoid standing for long periods of time Elevate legs to the level of the heart or above for 30 minutes daily and/or when sitting, a frequency of: - throughout the day. Off-Loading: Open toe surgical Wolf to: - left foot. ensure no pressure to bottom of left foot. use arms to push wheelchair around  not your foot. Home Health: [facility] - [facility] twice a week home health. wound center weekly. Laboratory ordered were: Anerobic culture - PCR culture of left foot proximal plantar foot wound. 1. I have changed her back to silver alginate because of the drainage coming out of the deeper wound 2. I have done a PCR culture of what looks to be necrotic subcutaneous tissue but no empiric antibiotics 3. The wound on the proximal part of her foot is deteriorating. I am concerned that if the infection spreads we may be looking at a second BKA for this patient. I will consider referring her to infectious disease for IV antibiotics 4. I have warned her to keep the pressure off this. Her more superficial wounds have callus around them suggesting she is using her foot to propel her wheelchair I have asked her not to do this Electronic Signature(s) Signed: 10/23/2019 4:22:08 PM By: Cheryl Ham MD Entered By: [name] on 10/23/2019 16:14:25 -------------------------------------------------------------------------------- SuperBill Details Patient Name: Date of Service: [name] 10/23/2019 Medical Record Number: [id number] Patient Account Number: [id number] Date of Birth/Sex: Treating RN: 08/17/1965 (54 y.o. 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, left ankle and foot Facility Procedures CPT4 Code: [id number] Description: 79009 - DEB SUBQ TISSUE 20 SQ CM/< ICD-10 Diagnosis Description L97.523 Non-pressure chronic ulcer of other part of left foot with necrosis of muscle E11.621 Type 2 diabetes mellitus with foot ulcer Modifier: Quantity: 1 Physician Procedures : CPT4 Code Description Modifier 2004159 30123 - WC PHYS SUBQ TISS 20 SQ CM 1 ICD-10 Diagnosis Description L97.523 Non-pressure chronic ulcer of other part of left foot with necrosis of muscle E11.621 Type 2 diabetes mellitus with foot ulcer Quantity: Electronic Signature(s) Signed: 10/23/2019 4:22:08 PM By: Cheryl Ham MD Entered By: [name] on 10/23/2019 16:14:39

## 2019-10-26 ENCOUNTER — Encounter (HOSPITAL_BASED_OUTPATIENT_CLINIC_OR_DEPARTMENT_OTHER): Payer: Medicare Other | Admitting: Internal Medicine

## 2019-11-01 ENCOUNTER — Other Ambulatory Visit: Payer: Self-pay

## 2019-11-01 ENCOUNTER — Encounter (HOSPITAL_BASED_OUTPATIENT_CLINIC_OR_DEPARTMENT_OTHER): Payer: Medicare Other | Admitting: Internal Medicine

## 2019-11-01 DIAGNOSIS — E11621 Type 2 diabetes mellitus with foot ulcer: Secondary | ICD-10-CM | POA: Diagnosis not present

## 2019-11-01 NOTE — Progress Notes (Signed)
[name], [name] ([id number]) Visit Report for 11/01/2019 Arrival Information Details Patient Name: Date of Service: [place] [name], [name] 11/01/2019 3:15 PM Medical Record Number: [id number] Patient Account Number: [id number] Date of Birth/Sex: Treating RN: April 06, 1965 (54 y.o. [name] Primary Care Oakleigh Hesketh: [name] Other Clinician: Referring Mikinzie Maciejewski: Treating Destanee Bedonie/Extender: Tonette Bihari, [name] in Treatment: 9 Visit Information History Since Last Visit Added or deleted any medications: No Patient Arrived: Wheel Chair Any new allergies or adverse reactions: No Arrival Time: 15:32 Had a fall or experienced change in No Accompanied By: self activities of daily living that may affect Transfer Assistance: None risk of falls: Patient Identification Verified: Yes Signs or symptoms of abuse/neglect since No Secondary Verification Process Completed: Yes last visito Patient Requires Transmission-Based Precautions: No Hospitalized since last visit: No Patient Has Alerts: No Implantable device outside of the clinic No excluding cellular tissue based products placed in the center since last visit: Has Dressing in Place as Prescribed: Yes Has Footwear/Offloading in Place as Yes Prescribed: Left: Surgical Wolf with Pressure Relief Insole Pain Present Now: Yes Electronic Signature(s) Signed: 11/01/2019 5:19:05 PM By: Baruch Gouty RN, [name] Entered By: Baruch Gouty on 11/01/2019 15:36:40 -------------------------------------------------------------------------------- Clinic Level of Care Assessment Details Patient Name: Date of Service: [name] 11/01/2019 3:15 PM Medical Record Number: [id number] Patient Account Number: [id number] Date of Birth/Sex: Treating RN: [birth year] (54 y.o. [name], [name] Primary Care Myshawn Chiriboga: [name] Other Clinician: Referring Axel Frisk: Treating Larz Mark/Extender: Tonette Bihari,  [name] in Treatment: 9 Clinic Level of Care Assessment Items TOOL 4 Quantity Score X- 1 0 Use when only an EandM is performed on FOLLOW-UP visit ASSESSMENTS - Nursing Assessment / Reassessment X- 1 10 Reassessment of Co-morbidities (includes updates in patient status) X- 1 5 Reassessment of Adherence to Treatment Plan ASSESSMENTS - Wound and Skin A ssessment / Reassessment '[]'  - 0 Simple Wound Assessment / Reassessment - one wound X- 3 5 Complex Wound Assessment / Reassessment - multiple wounds X- 1 10 Dermatologic / Skin Assessment (not related to wound area) ASSESSMENTS - Focused Assessment X- 1 5 Circumferential Edema Measurements - multi extremities X- 1 10 Nutritional Assessment / Counseling / Intervention '[]'  - 0 Lower Extremity Assessment (monofilament, tuning fork, pulses) '[]'  - 0 Peripheral Arterial Disease Assessment (using hand held doppler) ASSESSMENTS - Ostomy and/or Continence Assessment and Care '[]'  - 0 Incontinence Assessment and Management '[]'  - 0 Ostomy Care Assessment and Management (repouching, etc.) PROCESS - Coordination of Care '[]'  - 0 Simple Patient / Family Education for ongoing care X- 1 20 Complex (extensive) Patient / Family Education for ongoing care X- 1 10 Staff obtains Programmer, systems, Records, T Results / Process Orders est X- 1 10 Staff telephones HHA, Nursing Homes / Clarify orders / etc '[]'  - 0 Routine Transfer to another Facility (non-emergent condition) '[]'  - 0 Routine Hospital Admission (non-emergent condition) '[]'  - 0 New Admissions / Biomedical engineer / Ordering NPWT Apligraf, etc. , '[]'  - 0 Emergency Hospital Admission (emergent condition) '[]'  - 0 Simple Discharge Coordination X- 1 15 Complex (extensive) Discharge Coordination PROCESS - Special Needs '[]'  - 0 Pediatric / Minor Patient Management '[]'  - 0 Isolation Patient Management '[]'  - 0 Hearing / Language / Visual special needs '[]'  - 0 Assessment of Community  assistance (transportation, D/C planning, etc.) '[]'  - 0 Additional assistance / Altered mentation '[]'  - 0 Support Surface(s) Assessment (bed, cushion, seat, etc.) INTERVENTIONS - Wound Cleansing / Measurement '[]'  - 0 Simple Wound Cleansing -  one wound X- 3 5 Complex Wound Cleansing - multiple wounds X- 1 5 Wound Imaging (photographs - any number of wounds) '[]'  - 0 Wound Tracing (instead of photographs) '[]'  - 0 Simple Wound Measurement - one wound X- 3 5 Complex Wound Measurement - multiple wounds INTERVENTIONS - Wound Dressings '[]'  - 0 Small Wound Dressing one or multiple wounds X- 1 15 Medium Wound Dressing one or multiple wounds '[]'  - 0 Large Wound Dressing one or multiple wounds '[]'  - 0 Application of Medications - topical '[]'  - 0 Application of Medications - injection INTERVENTIONS - Miscellaneous '[]'  - 0 External ear exam '[]'  - 0 Specimen Collection (cultures, biopsies, blood, body fluids, etc.) '[]'  - 0 Specimen(s) / Culture(s) sent or taken to Lab for analysis '[]'  - 0 Patient Transfer (multiple staff / Civil Service fast streamer / Similar devices) '[]'  - 0 Simple Staple / Suture removal (25 or less) '[]'  - 0 Complex Staple / Suture removal (26 or more) '[]'  - 0 Hypo / Hyperglycemic Management (close monitor of Blood Glucose) '[]'  - 0 Ankle / Brachial Index (ABI) - do not check if billed separately X- 1 5 Vital Signs Has the patient been seen at the hospital within the last three years: Yes Total Score: 165 Level Of Care: New/Established - Level 5 Electronic Signature(s) Signed: 11/01/2019 5:24:43 PM By: Deon Pilling Entered By: Deon Pilling on 11/01/2019 16:18:53 -------------------------------------------------------------------------------- Encounter Discharge Information Details Patient Name: Date of Service: [name] 11/01/2019 3:15 PM Medical Record Number: [id number] Patient Account Number: [id number] Date of Birth/Sex: Treating RN: 05/26/1965 (54 y.o. [name] Primary Care Keriann Rankin: [name] Other Clinician: Referring Emani Morad: Treating Jane Birkel/Extender: Tonette Bihari, [name] in Treatment: 9 Encounter Discharge Information Items Discharge Condition: Stable Ambulatory Status: Wheelchair Discharge Destination: Home Transportation: Other Accompanied By: self Schedule Follow-up Appointment: Yes Clinical Summary of Care: Patient Declined Notes transportation service Electronic Signature(s) Signed: 11/01/2019 5:19:05 PM By: Baruch Gouty RN, [name] Entered By: Baruch Gouty on 11/01/2019 16:35:17 -------------------------------------------------------------------------------- Lower Extremity Assessment Details Patient Name: Date of Service: Cheryl RAMEY, KETCHERSIDE 11/01/2019 3:15 PM Medical Record Number: [id number] Patient Account Number: [id number] Date of Birth/Sex: Treating RN: 1965-07-10 (54 y.o. [name] Primary Care Kiaya Haliburton: [name] Other Clinician: Referring Rosalina Dingwall: Treating Jenilee Franey/Extender: Tonette Bihari, [name] in Treatment: 9 Edema Assessment Assessed: [Left: No] [Right: No] Edema: [Left: Ye] [Right: s] Calf Left: Right: Point of Measurement: From Medial Instep 42 cm Ankle Left: Right: Point of Measurement: From Medial Instep 25.9 cm Vascular Assessment Pulses: Dorsalis Pedis Palpable: [Left:Yes] Electronic Signature(s) Signed: 11/01/2019 5:19:05 PM By: Baruch Gouty RN, [name] Entered By: Baruch Gouty on 11/01/2019 15:41:50 -------------------------------------------------------------------------------- Multi Wound Chart Details Patient Name: Date of Service: [name] 11/01/2019 3:15 PM Medical Record Number: [id number] Patient Account Number: [id number] Date of Birth/Sex: Treating RN: 1965/03/24 (54 y.o. [name], [name] Primary Care Alyona Romack: [name] Other Clinician: Referring Glade Strausser: Treating Khamila Bassinger/Extender: Tonette Bihari, [name] in Treatment: 9 Vital Signs Height(in): 78 Pulse(bpm): 68 Weight(lbs): 200 Blood Pressure(mmHg): 119/68 Body Mass Index(BMI): 31 Temperature(F): 98.6 Respiratory Rate(breaths/min): 18 Photos: [1:No Photos Left, Proximal, Plantar Foot] [2:No Photos Left, Distal, Plantar Foot] [3:No Photos Left Metatarsal head first] Wound Location: [1:Blister] [2:Blister] [3:Blister] Wounding Event: [1:Diabetic Wound/Ulcer of the Lower] [2:Diabetic Wound/Ulcer of the Lower] [3:Diabetic Wound/Ulcer of the Lower] Primary Etiology: [1:Extremity Cataracts, Anemia, Congestive Heart Cataracts, Anemia, Congestive Heart Cataracts, Anemia, Congestive Heart] [2:Extremity] [3:Extremity] Comorbid History: [1:Failure, Hypertension, Type II Diabetes, Osteomyelitis, Neuropathy  Diabetes, Osteomyelitis, Neuropathy Diabetes, Osteomyelitis, Neuropathy 08/02/2019] [2:Failure, Hypertension, Type II 08/02/2019] [3:Failure, Hypertension, Type II  08/02/2019] Date Acquired: [1:9] [2:9] [3:9] Weeks of Treatment: [1:Open] [2:Open] [3:Open] Wound Status: [1:1.4x3.4x1.9] [2:0.9x2.3x0.7] [3:0.4x0.4x0.1] Measurements L x W x D (cm) [1:3.738] [2:1.626] [3:0.126] A (cm) : rea [1:7.103] [2:1.138] [3:0.013] Volume (cm) : [1:15.00%] [2:31.00%] [3:83.30%] % Reduction in A rea: [1:32.70%] [2:51.70%] [3:97.50%] % Reduction in Volume: [1:8] Position 1 (o'clock): [1:1.2] Maximum Distance 1 (cm): [1:3] [2:6] Starting Position 1 (o'clock): [1:4] [2:7] Ending Position 1 (o'clock): [1:1.9] [2:0.9] Maximum Distance 1 (cm): [1:Yes] [2:No] [3:No] Tunneling: [1:Yes] [2:Yes] [3:No] Undermining: [1:Grade 2] [2:Grade 2] [3:Grade 3] Classification: [1:Medium] [2:Medium] [3:Small] Exudate A mount: [1:Serosanguineous] [2:Serosanguineous] [3:Serosanguineous] Exudate Type: [1:red, brown] [2:red, brown] [3:red, brown] Exudate Color: [1:Well defined, not attached] [2:Epibole] [3:Well defined, not attached] Wound Margin:  [1:Medium (34-66%)] [2:Large (67-100%)] [3:Large (67-100%)] Granulation A mount: [1:Red] [2:Red] [3:Red, Pink] Granulation Quality: [1:Medium (34-66%)] [2:Small (1-33%)] [3:None Present (0%)] Necrotic A mount: [1:Fat Layer (Subcutaneous Tissue): Yes Fat Layer (Subcutaneous Tissue): Yes Fat Layer (Subcutaneous Tissue): Yes] Exposed Structures: [1:Muscle: Yes Fascia: No Tendon: No Joint: No Bone: No None] [2:Fascia: No Tendon: No Muscle: No Joint: No Bone: No None] [3:Fascia: No Tendon: No Muscle: No Joint: No Bone: No Small (1-33%)] Treatment Notes Wound #1 (Left, Proximal, Plantar Foot) 2. Periwound Care Moisturizing lotion 3. Primary Dressing Applied Calcium Alginate Ag 4. Secondary Dressing ABD Pad Dry Gauze Roll Gauze 7. Footwear/Offloading device applied Surgical Wolf Notes netting Wound #2 (Left, Distal, Plantar Foot) 2. Periwound Care Moisturizing lotion 3. Primary Dressing Applied Calcium Alginate Ag 4. Secondary Dressing ABD Pad Dry Gauze Roll Gauze 7. Footwear/Offloading device applied Surgical Wolf Notes netting Wound #3 (Left Metatarsal head first) 2. Periwound Care Moisturizing lotion 3. Primary Dressing Applied Calcium Alginate Ag 4. Secondary Dressing ABD Pad Dry Gauze Roll Gauze 7. Footwear/Offloading device applied Surgical Wolf Notes netting Electronic Signature(s) Signed: 11/01/2019 5:01:15 PM By: Linton Ham MD Signed: 11/01/2019 5:24:43 PM By: Deon Pilling Entered By: Linton Ham on 11/01/2019 16:37:48 -------------------------------------------------------------------------------- Multi-Disciplinary Care Plan Details Patient Name: Date of Service: Cheryl TICIA, VIRGO 11/01/2019 3:15 PM Medical Record Number: [id number] Patient Account Number: [id number] Date of Birth/Sex: Treating RN: 12-06-1965 (54 y.o. [name], Meta.Reding Primary Care Christiana Gurevich: Other Clinician: [name] Referring Leondra Cullin: Treating Osmar Howton/Extender:  Tonette Bihari, [name] in Treatment: 9 Active Inactive Abuse / Safety / Falls / Self Care Management Nursing Diagnoses: Potential for falls Potential for injury related to falls Goals: Patient will not experience any injury related to falls Date Initiated: 08/30/2019 Target Resolution Date: 12/07/2019 Goal Status: Active Patient/caregiver will verbalize/demonstrate measures taken to prevent injury and/or falls Date Initiated: 08/30/2019 Date Inactivated: 09/27/2019 Target Resolution Date: 09/28/2019 Goal Status: Met Interventions: Assess Activities of Daily Living upon admission and as needed Assess fall risk on admission and as needed Assess: immobility, friction, shearing, incontinence upon admission and as needed Assess impairment of mobility on admission and as needed per policy Assess personal safety and home safety (as indicated) on admission and as needed Assess self care needs on admission and as needed Provide education on fall prevention Provide education on personal and home safety Notes: Nutrition Nursing Diagnoses: Impaired glucose control: actual or potential Potential for alteratiion in Nutrition/Potential for imbalanced nutrition Goals: Patient/caregiver agrees to and verbalizes understanding of need to use nutritional supplements and/or vitamins as prescribed Date Initiated: 08/30/2019 Date Inactivated: 10/11/2019 Target Resolution Date: 10/05/2019 Goal Status: Met Patient/caregiver will maintain therapeutic glucose control Date Initiated:  08/30/2019 Target Resolution Date: 12/07/2019 Goal Status: Active Interventions: Assess HgA1c results as ordered upon admission and as needed Assess patient nutrition upon admission and as needed per policy Provide education on elevated blood sugars and impact on wound healing Provide education on nutrition Treatment Activities: Education provided on Nutrition : 08/30/2019 Notes: Wound/Skin Impairment Nursing  Diagnoses: Impaired tissue integrity Knowledge deficit related to ulceration/compromised skin integrity Goals: Patient/caregiver will verbalize understanding of skin care regimen Date Initiated: 08/30/2019 Target Resolution Date: 12/07/2019 Goal Status: Active Interventions: Assess patient/caregiver ability to obtain necessary supplies Assess patient/caregiver ability to perform ulcer/skin care regimen upon admission and as needed Assess ulceration(s) every visit Provide education on ulcer and skin care Notes: Electronic Signature(s) Signed: 11/01/2019 5:24:43 PM By: Deon Pilling Entered By: Deon Pilling on 11/01/2019 16:15:09 -------------------------------------------------------------------------------- Pain Assessment Details Patient Name: Date of Service: LORILYNN, LEHR 11/01/2019 3:15 PM Medical Record Number: [id number] Patient Account Number: [id number] Date of Birth/Sex: Treating RN: June 20, 1965 (54 y.o. [name] Primary Care Brantley Naser: [name] Other Clinician: Referring Avonelle Viveros: Treating Laurren Lepkowski/Extender: Tonette Bihari, [name] in Treatment: 9 Active Problems Location of Pain Severity and Description of Pain Patient Has Paino Yes Site Locations Pain Location: Pain in Ulcers With Dressing Change: No Duration of the Pain. Constant / Intermittento Intermittent Rate the pain. Current Pain Level: 0 Worst Pain Level: 8 Least Pain Level: 0 Character of Pain Describe the Pain: Throbbing Pain Management and Medication Current Pain Management: Other: time Is the Current Pain Management Adequate: Adequate How does your wound impact your activities of daily livingo Sleep: No Bathing: No Appetite: No Relationship With Others: No Bladder Continence: No Emotions: No Bowel Continence: No Hobbies: No Toileting: No Dressing: No Electronic Signature(s) Signed: 11/01/2019 5:19:05 PM By: Baruch Gouty RN, [name] Entered By:  Baruch Gouty on 11/01/2019 15:38:02 -------------------------------------------------------------------------------- Patient/Caregiver Education Details Patient Name: Date of Service: [name] 10/28/2021andnbsp3:15 PM Medical Record Number: [id number] Patient Account Number: [id number] Date of Birth/Gender: Treating RN: Dec 05, 1965 (54 y.o. [name] Primary Care Physician: [name] Other Clinician: Referring Physician: Treating Physician/Extender: Sonda Primes in Treatment: 9 Education Assessment Education Provided To: Patient Education Topics Provided Elevated Blood Sugar/ Impact on Healing: Handouts: Elevated Blood Sugars: How Do They Affect Wound Healing Methods: Explain/Verbal Responses: Reinforcements needed Electronic Signature(s) Signed: 11/01/2019 5:24:43 PM By: Deon Pilling Entered By: Deon Pilling on 11/01/2019 16:15:26 -------------------------------------------------------------------------------- Wound Assessment Details Patient Name: Date of Service: VIA, ROSADO 11/01/2019 3:15 PM Medical Record Number: [id number] Patient Account Number: [id number] Date of Birth/Sex: Treating RN: 11/13/1965 (54 y.o. [name] Primary Care Bronislaus Verdell: [name] Other Clinician: Referring Shalimar Mcclain: Treating Emilea Goga/Extender: Tonette Bihari, [name] in Treatment: 9 Wound Status Wound Number: 1 Primary Diabetic Wound/Ulcer of the Lower Extremity Etiology: Wound Location: Left, Proximal, Plantar Foot Wound Open Wounding Event: Blister Status: Date Acquired: 08/02/2019 Comorbid Cataracts, Anemia, Congestive Heart Failure, Hypertension, Type Weeks Of Treatment: 9 History: II Diabetes, Osteomyelitis, Neuropathy Clustered Wound: No Wound Measurements Length: (cm) 1.4 Width: (cm) 3.4 Depth: (cm) 1.9 Area: (cm) 3.738 Volume: (cm) 7.103 % Reduction in Area: 15% % Reduction in Volume:  32.7% Epithelialization: None Tunneling: Yes Position (o'clock): 8 Maximum Distance: (cm) 1.2 Undermining: Yes Starting Position (o'clock): 3 Ending Position (o'clock): 4 Maximum Distance: (cm) 1.9 Wound Description Classification: Grade 2 Wound Margin: Well defined, not attached Exudate Amount: Medium Exudate Type: Serosanguineous Exudate Color: red, brown Foul Odor After Cleansing: No Slough/Fibrino Yes Wound Bed Granulation Amount:  Medium (34-66%) Exposed Structure Granulation Quality: Red Fascia Exposed: No Necrotic Amount: Medium (34-66%) Fat Layer (Subcutaneous Tissue) Exposed: Yes Necrotic Quality: Adherent Slough Tendon Exposed: No Muscle Exposed: Yes Necrosis of Muscle: Yes Joint Exposed: No Bone Exposed: No Treatment Notes Wound #1 (Left, Proximal, Plantar Foot) 2. Periwound Care Moisturizing lotion 3. Primary Dressing Applied Calcium Alginate Ag 4. Secondary Dressing ABD Pad Dry Gauze Roll Gauze 7. Footwear/Offloading device applied Surgical Wolf Notes netting Electronic Signature(s) Signed: 11/01/2019 5:19:05 PM By: Baruch Gouty RN, [name] Entered By: Baruch Gouty on 11/01/2019 15:52:09 -------------------------------------------------------------------------------- Wound Assessment Details Patient Name: Date of Service: Cheryl AUNDREA, HORACE 11/01/2019 3:15 PM Medical Record Number: [id number] Patient Account Number: [id number] Date of Birth/Sex: Treating RN: 07/01/1965 (54 y.o. [name] Primary Care Arleta Ostrum: [name] Other Clinician: Referring Jaeson Molstad: Treating Dontrel Smethers/Extender: Tonette Bihari, [name] in Treatment: 9 Wound Status Wound Number: 2 Primary Diabetic Wound/Ulcer of the Lower Extremity Etiology: Wound Location: Left, Distal, Plantar Foot Wound Open Wounding Event: Blister Status: Date Acquired: 08/02/2019 Comorbid Cataracts, Anemia, Congestive Heart Failure, Hypertension, Type Weeks Of Treatment:  9 History: II Diabetes, Osteomyelitis, Neuropathy Clustered Wound: No Wound Measurements Length: (cm) 0.9 Width: (cm) 2.3 Depth: (cm) 0.7 Area: (cm) 1.626 Volume: (cm) 1.138 Wound Description Classification: Grade 2 Wound Margin: Epibole Exudate Amount: Medium Exudate Type: Serosanguineous Exudate Color: red, brown Foul Odor After Cleansing: No Slough/Fibrino Ye % Reduction in Area: 31% % Reduction in Volume: 51.7% Epithelialization: None Tunneling: No Undermining: Yes Starting Position (o'clock): 6 Ending Position (o'clock): 7 Maximum Distance: (cm) 0.9 s Wound Bed Granulation Amount: Large (67-100%) Exposed Structure Granulation Quality: Red Fascia Exposed: No Necrotic Amount: Small (1-33%) Fat Layer (Subcutaneous Tissue) Exposed: Yes Necrotic Quality: Adherent Slough Tendon Exposed: No Muscle Exposed: No Joint Exposed: No Bone Exposed: No Treatment Notes Wound #2 (Left, Distal, Plantar Foot) 2. Periwound Care Moisturizing lotion 3. Primary Dressing Applied Calcium Alginate Ag 4. Secondary Dressing ABD Pad Dry Gauze Roll Gauze 7. Footwear/Offloading device applied Surgical Wolf Notes netting Electronic Signature(s) Signed: 11/01/2019 5:19:05 PM By: Baruch Gouty RN, [name] Entered By: Baruch Gouty on 11/01/2019 15:53:01 -------------------------------------------------------------------------------- Wound Assessment Details Patient Name: Date of Service: Cheryl NINAMARIE, KEEL 11/01/2019 3:15 PM Medical Record Number: [id number] Patient Account Number: [id number] Date of Birth/Sex: Treating RN: [birth year] (54 y.o. [name] Primary Care Emric Kowalewski: [name] Other Clinician: Referring Teliyah Royal: Treating Leanard Dimaio/Extender: Tonette Bihari, [name] in Treatment: 9 Wound Status Wound Number: 3 Primary Diabetic Wound/Ulcer of the Lower Extremity Etiology: Wound Location: Left Metatarsal head first Wound Open Wounding Event:  Blister Status: Date Acquired: 08/02/2019 Comorbid Cataracts, Anemia, Congestive Heart Failure, Hypertension, Type Weeks Of Treatment: 9 History: II Diabetes, Osteomyelitis, Neuropathy Clustered Wound: No Wound Measurements Length: (cm) 0.4 Width: (cm) 0.4 Depth: (cm) 0.1 Area: (cm) 0.126 Volume: (cm) 0.013 % Reduction in Area: 83.3% % Reduction in Volume: 97.5% Epithelialization: Small (1-33%) Tunneling: No Undermining: No Wound Description Classification: Grade 3 Wound Margin: Well defined, not attached Exudate Amount: Small Exudate Type: Serosanguineous Exudate Color: red, brown Foul Odor After Cleansing: No Slough/Fibrino No Wound Bed Granulation Amount: Large (67-100%) Exposed Structure Granulation Quality: Red, Pink Fascia Exposed: No Necrotic Amount: None Present (0%) Fat Layer (Subcutaneous Tissue) Exposed: Yes Tendon Exposed: No Muscle Exposed: No Joint Exposed: No Bone Exposed: No Treatment Notes Wound #3 (Left Metatarsal head first) 2. Periwound Care Moisturizing lotion 3. Primary Dressing Applied Calcium Alginate Ag 4. Secondary Dressing ABD Pad Dry Gauze Roll Gauze 7. Footwear/Offloading device applied  Surgical Wolf Notes netting Electronic Signature(s) Signed: 11/01/2019 5:19:05 PM By: Baruch Gouty RN, [name] Entered By: Baruch Gouty on 11/01/2019 15:53:23 -------------------------------------------------------------------------------- Vitals Details Patient Name: Date of Service: Cheryl SHAMELL, HITTLE 11/01/2019 3:15 PM Medical Record Number: [id number] Patient Account Number: [id number] Date of Birth/Sex: Treating RN: [birth year] (54 y.o. [name] Primary Care Jc Veron: [name] Other Clinician: Referring Honor Frison: Treating Dashana Guizar/Extender: Tonette Bihari, [name] in Treatment: 9 Vital Signs Time Taken: 15:36 Temperature (F): 98.6 Height (in): 67 Pulse (bpm): 73 Source: Stated Respiratory Rate  (breaths/min): 18 Weight (lbs): 200 Blood Pressure (mmHg): 119/68 Source: Stated Reference Range: 80 - 120 mg / dl Body Mass Index (BMI): 31.3 Notes pt does not check her blood sugar regularly Electronic Signature(s) Signed: 11/01/2019 5:19:05 PM By: Baruch Gouty RN, [name] Entered By: Baruch Gouty on 11/01/2019 15:37:20

## 2019-11-01 NOTE — Progress Notes (Signed)
[name], [name] ([id number]) Visit Report for 11/01/2019 HPI Details Patient Name: Date of Service: [name], [name] 11/01/2019 3:15 PM Medical Record Number: [id number] Patient Account Number: [id number] Date of Birth/Sex: Treating RN: [birth year] (54 y.o. [name] Primary Care Provider: [name] Other Clinician: Referring Provider: Treating Provider/Extender: Tonette Bihari, [name] in Treatment: 9 History of Present Illness HPI Description: 28ADMISSION 08/30/2019 This is a 54 year old woman with type 2 diabetes and peripheral neuropathy. She developed a blister on the plantar left foot on 7/27. She was seen in the emergency room treated. This did not get any better and she required admission to the hospital from 08/02/2019 through 08/09/2019. She was felt to have cellulitis of the left foot. She was treated with IV medications/antibiotics discharged on Augmentin however she could not tolerate this and she was changed to doxycycline for 5 days she has completed this. She has advanced home care they are apparently putting Xeroform over the wound. When she left the hospital she was supposed to be putting Santyl over the black areas on the left plantar foot covered with Xeroform. X-rays done in the hospital did not show evidence of osteomyelitis only a superficial plantar ulceration. She had blood cultures but I do not see a wound culture. The patient has a past medical history of a right below-knee amputation by Dr. [name] in 2012 presumably for infection in this area although I am not certain. As noted she is a type II diabetic on oral agents with a recent hemoglobin A1c of 5.5. Other past medical history includes ITP, peripheral edema, chronic kidney disease stage III, hypertension, right below-knee amputation, diastolic heart failure. She tells me she is wheelchair-bound and is nonweightbearing although she does push the wheelchair along with the left foot. She is  only using hospital sock on the left foot. ABI in our clinic was 1.06 on the left 9/7; the patient cultured Pseudomonas last week out of the deeper proximal wound. MRI that was ordered showed progressive soft tissue ulceration along the plantar aspect of the foot especially along the medial arch but also plantar to the first metatarsal phalangeal joint no drainable abscess or foreign body. There was a suspicion for early osteomyelitis in the fifth metatarsal base but she does not have a wound in this area. Also problematically is that where we put her prescription for ciprofloxacin and they did not have the antibiotic and we verified that they are out of ciprofloxacin. We did not receive the message from them to this effect The patient did see Dr. [name] of podiatry. The wound was debrided. He prescribed Santyl which I do not think she is picked up either. Lab work including a CBC sedimentation rate and C-reactive protein were ordered. Finally she is using silver alginate she has home health changing the dressing 3 times a week she does not disturb this in between times 9/23; we have not seen this lady in 2-1/2 weeks largely because of transportation issues. She still has 3 wound areas a deep area in the midfoot and area just above this and then an area on the first met head. She has been using silver alginate on the wound. They are trying to use home health however they are trying to teach her how to do this at be been very difficult for this patient to do so. In discussing things with her she uses this foot to propel her wheelchair I have asked her to stop doing this and to use her  arms. She states that she is only moving around in her house. We will need to have a look at her lab work that I quoted from the last time she was here that was ordered by Dr. Lilian Kapur of podiatry. By our MRI she does not have osteomyelitis in any of the wounded areas. 10/7 2-week follow-up. The patient has 3 wounds  including superficially over the left first metatarsal head, left first submetatarsal in the lateral foot and a deep area more distally. Her MRI did not show osteomyelitis in reference to the wounds that we are treating although did suggest an early possibility in the left fifth metatarsal head however there is no wound in this area. I have reviewed her inflammatory markers. Most recently on 7/26 she had a sedimentation rate of 117 although this was previously greater than 140 and then 108. Last CRP was on 8/20 at 5.1 comparing to previously at 4.27. Noteworthy that this is when she was in the hospital in July I believe. 10/19; patient missed her appointment last week because of transportation issues. We have been using silver collagen. She still has the area over the metatarsal head and the mid part of her foot which are superficial wounds about the same. Marked deterioration in the problematic distal wound. This now is right down into the fascial layer of her foot. Our intake nurse noted purulent drainage. She has a small satellite lesion towards the heel 10/28; PCR culture of the major wound she has on her proximal plantar foot grew both Peptostreptococcus and Corynebacterium. The Corynebacterium could easily be a skin contaminant but Peptostreptococcus deserve treatment and I started her on Augmentin she arrives in clinic today with the wound looking a lot better Electronic Signature(s) Signed: 11/01/2019 5:01:15 PM By: Baltazar Najjar MD Entered By: Baltazar Najjar on 11/01/2019 16:40:25 -------------------------------------------------------------------------------- Physical Exam Details Patient Name: Date of Service: [name], [name] 11/01/2019 3:15 PM Medical Record Number: [id number] Patient Account Number: [id number] Date of Birth/Sex: Treating RN: 04/09/1965 (54 y.o. [name] Primary Care Provider: Thad Ranger Other Clinician: Referring Provider: Treating  Provider/Extender: Grayland Jack, Donivan Scull in Treatment: 9 Constitutional Sitting or standing Blood Pressure is within target range for patient.. Pulse regular and within target range for patient.Marland Kitchen Respirations regular, non-labored and within target range.. Temperature is normal and within the target range for the patient.Marland Kitchen Appears in no distress. Cardiovascular Pedal pulses palpable. Notes Wound exam The superficial wounds are about the same size however the wound tissue looks better Remarkable improvement in the major wound which I think was a surgical wound on the proximal part of her plantar foot. She has a healthy looking surface here quite a bit better than last week. There is no probing depth no palpable bone no purulence No erythema around the wound Electronic Signature(s) Signed: 11/01/2019 5:01:15 PM By: Baltazar Najjar MD Entered By: Baltazar Najjar on 11/01/2019 16:43:12 -------------------------------------------------------------------------------- Physician Orders Details Patient Name: Date of Service: [name], [name] 11/01/2019 3:15 PM Medical Record Number: [id number] Patient Account Number: [id number] Date of Birth/Sex: Treating RN: Mar 22, 1965 (54 y.o. Debara Pickett, Millard.Loa Primary Care Provider: Thad Ranger Other Clinician: Referring Provider: Treating Provider/Extender: Grayland Jack, Donivan Scull in Treatment: 9 Verbal / Phone Orders: No Diagnosis Coding ICD-10 Coding Code Description E11.621 Type 2 diabetes mellitus with foot ulcer L97.523 Non-pressure chronic ulcer of other part of left foot with necrosis of muscle Z89.511 Acquired absence of right leg below knee M86.672 Other chronic osteomyelitis, left ankle and  foot Follow-up Appointments ppointment in 1 week. - Thursday Return A Dressing Change Frequency Change dressing three times week. - Twice a week home health. wound center weekly. Wound Cleansing Clean wound with Wound  Cleanser - or normal saline - all wounds Primary Wound Dressing Wound #1 Left,Proximal,Plantar Foot lginate with Silver - ensure to pack lightly into any depth. Calcium A Wound #2 Left,Distal,Plantar Foot Calcium Alginate with Silver Wound #3 Left Metatarsal head first Calcium Alginate with Silver Secondary Dressing Kerlix/Rolled Gauze Dry Gauze ABD pad Edema Control Avoid standing for long periods of time Elevate legs to the level of the heart or above for 30 minutes daily and/or when sitting, a frequency of: - throughout the day. Off-Loading Open toe surgical shoe to: - left foot. ensure no pressure to bottom of left foot. use arms to push wheelchair around not your foot. [facility] for wound care. - [facility] twice a week home health. wound center weekly. Electronic Signature(s) Signed: 11/01/2019 5:01:15 PM By: Linton Ham MD Signed: 11/01/2019 5:24:43 PM By: Deon Pilling Entered By: Deon Pilling on 11/01/2019 16:17:35 -------------------------------------------------------------------------------- Problem List Details Patient Name: Date of Service: [name], [name] 11/01/2019 3:15 PM Medical Record Number: [id number] Patient Account Number: [id number] Date of Birth/Sex: Treating RN: Jan 18, 1965 (54 y.o. [name] Primary Care Provider: [name] Other Clinician: Referring Provider: Treating Provider/Extender: Tonette Bihari, [name] in Treatment: 9 Active Problems ICD-10 Encounter Code Description Active Date MDM Diagnosis E11.621 Type 2 diabetes mellitus with foot ulcer 08/30/2019 No Yes L97.523 Non-pressure chronic ulcer of other part of left foot with necrosis of muscle 08/30/2019 No Yes Z89.511 Acquired absence of right leg below knee 08/30/2019 No Yes M86.672 Other chronic osteomyelitis, left ankle and foot 08/30/2019 No Yes Inactive Problems Resolved Problems Electronic  Signature(s) Signed: 11/01/2019 5:01:15 PM By: Linton Ham MD Entered By: Linton Ham on 11/01/2019 16:37:39 -------------------------------------------------------------------------------- Progress Note Details Patient Name: Date of Service: [name] 11/01/2019 3:15 PM Medical Record Number: [id number] Patient Account Number: [id number] Date of Birth/Sex: Treating RN: [birth year] (54 y.o. [name] Primary Care Provider: [name] Other Clinician: Referring Provider: Treating Provider/Extender: Tonette Bihari, [name] in Treatment: 9 Subjective History of Present Illness (HPI) 28ADMISSION 08/30/2019 This is a 54 year old woman with type 2 diabetes and peripheral neuropathy. She developed a blister on the plantar left foot on 7/27. She was seen in the emergency room treated. This did not get any better and she required admission to the hospital from 08/02/2019 through 08/09/2019. She was felt to have cellulitis of the left foot. She was treated with IV medications/antibiotics discharged on Augmentin however she could not tolerate this and she was changed to doxycycline for 5 days she has completed this. She has advanced home care they are apparently putting Xeroform over the wound. When she left the hospital she was supposed to be putting Santyl over the black areas on the left plantar foot covered with Xeroform. X-rays done in the hospital did not show evidence of osteomyelitis only a superficial plantar ulceration. She had blood cultures but I do not see a wound culture. The patient has a past medical history of a right below-knee amputation by Dr. [name] in 2012 presumably for infection in this area although I am not certain. As noted she is a type II diabetic on oral agents with a recent hemoglobin A1c of 5.5. Other past medical history includes ITP, peripheral edema, chronic kidney disease  stage III, hypertension, right below-knee amputation,  diastolic heart failure. She tells me she is wheelchair-bound and is nonweightbearing although she does push the wheelchair along with the left foot. She is only using hospital sock on the left foot. ABI in our clinic was 1.06 on the left 9/7; the patient cultured Pseudomonas last week out of the deeper proximal wound. MRI that was ordered showed progressive soft tissue ulceration along the plantar aspect of the foot especially along the medial arch but also plantar to the first metatarsal phalangeal joint no drainable abscess or foreign body. There was a suspicion for early osteomyelitis in the fifth metatarsal base but she does not have a wound in this area. Also problematically is that where we put her prescription for ciprofloxacin and they did not have the antibiotic and we verified that they are out of ciprofloxacin. We did not receive the message from them to this effect The patient did see Dr. Lilian Kapur of podiatry. The wound was debrided. He prescribed Santyl which I do not think she is picked up either. Lab work including a CBC sedimentation rate and C-reactive protein were ordered. Finally she is using silver alginate she has home health changing the dressing 3 times a week she does not disturb this in between times 9/23; we have not seen this lady in 2-1/2 weeks largely because of transportation issues. She still has 3 wound areas a deep area in the midfoot and area just above this and then an area on the first met head. She has been using silver alginate on the wound. They are trying to use home health however they are trying to teach her how to do this at be been very difficult for this patient to do so. In discussing things with her she uses this foot to propel her wheelchair I have asked her to stop doing this and to use her arms. She states that she is only moving around in her house. We will need to have a look at her lab work that I quoted from the last time she was here that was  ordered by Dr. Lilian Kapur of podiatry. By our MRI she does not have osteomyelitis in any of the wounded areas. 10/7 2-week follow-up. The patient has 3 wounds including superficially over the left first metatarsal head, left first submetatarsal in the lateral foot and a deep area more distally. Her MRI did not show osteomyelitis in reference to the wounds that we are treating although did suggest an early possibility in the left fifth metatarsal head however there is no wound in this area. I have reviewed her inflammatory markers. Most recently on 7/26 she had a sedimentation rate of 117 although this was previously greater than 140 and then 108. Last CRP was on 8/20 at 5.1 comparing to previously at 4.27. Noteworthy that this is when she was in the hospital in July I believe. 10/19; patient missed her appointment last week because of transportation issues. We have been using silver collagen. She still has the area over the metatarsal head and the mid part of her foot which are superficial wounds about the same. Marked deterioration in the problematic distal wound. This now is right down into the fascial layer of her foot. Our intake nurse noted purulent drainage. She has a small satellite lesion towards the heel 10/28; PCR culture of the major wound she has on her proximal plantar foot grew both Peptostreptococcus and Corynebacterium. The Corynebacterium could easily be a skin contaminant but Peptostreptococcus  deserve treatment and I started her on Augmentin she arrives in clinic today with the wound looking a lot better Objective Constitutional Sitting or standing Blood Pressure is within target range for patient.. Pulse regular and within target range for patient.Marland Kitchen Respirations regular, non-labored and within target range.. Temperature is normal and within the target range for the patient.Marland Kitchen Appears in no distress. Vitals Time Taken: 3:36 PM, Height: 67 in, Source: Stated, Weight: 200 lbs, Source:  Stated, BMI: 31.3, Temperature: 98.6 F, Pulse: 73 bpm, Respiratory Rate: 18 breaths/min, Blood Pressure: 119/68 mmHg. General Notes: pt does not check her blood sugar regularly Cardiovascular Pedal pulses palpable. General Notes: Wound exam ooThe superficial wounds are about the same size however the wound tissue looks better ooRemarkable improvement in the major wound which I think was a surgical wound on the proximal part of her plantar foot. She has a healthy looking surface here quite a bit better than last week. There is no probing depth no palpable bone no purulence ooNo erythema around the wound Integumentary (Hair, Skin) Wound #1 status is Open. Original cause of wound was Blister. The wound is located on the Left,Proximal,Plantar Foot. The wound measures 1.4cm length x 3.4cm width x 1.9cm depth; 3.738cm^2 area and 7.103cm^3 volume. There is muscle and Fat Layer (Subcutaneous Tissue) exposed. Tunneling has been noted at 8:00 with a maximum distance of 1.2cm. Undermining begins at 3:00 and ends at 4:00 with a maximum distance of 1.9cm. There is a medium amount of serosanguineous drainage noted. The wound margin is well defined and not attached to the wound base. There is medium (34-66%) red granulation within the wound bed. There is a medium (34-66%) amount of necrotic tissue within the wound bed including Adherent Slough and Necrosis of Muscle. Wound #2 status is Open. Original cause of wound was Blister. The wound is located on the [place]. The wound measures 0.9cm length x 2.3cm width x 0.7cm depth; 1.626cm^2 area and 1.138cm^3 volume. There is Fat Layer (Subcutaneous Tissue) exposed. There is no tunneling noted, however, there is undermining starting at 6:00 and ending at 7:00 with a maximum distance of 0.9cm. There is a medium amount of serosanguineous drainage noted. The wound margin is epibole. There is large (67-100%) red granulation within the wound bed. There  is a small (1-33%) amount of necrotic tissue within the wound bed including Adherent Slough. Wound #3 status is Open. Original cause of wound was Blister. The wound is located on the Left Metatarsal head first. The wound measures 0.4cm length x 0.4cm width x 0.1cm depth; 0.126cm^2 area and 0.013cm^3 volume. There is Fat Layer (Subcutaneous Tissue) exposed. There is no tunneling or undermining noted. There is a small amount of serosanguineous drainage noted. The wound margin is well defined and not attached to the wound base. There is large (67- 100%) red, pink granulation within the wound bed. There is no necrotic tissue within the wound bed. Assessment Active Problems ICD-10 Type 2 diabetes mellitus with foot ulcer Non-pressure chronic ulcer of other part of left foot with necrosis of muscle Acquired absence of right leg below knee Other chronic osteomyelitis, left ankle and foot Plan Follow-up Appointments: Return Appointment in 1 week. - Thursday Dressing Change Frequency: Change dressing three times week. - Twice a week home health. wound center weekly. Wound Cleansing: Clean wound with Wound Cleanser - or normal saline - all wounds Primary Wound Dressing: Wound #1 Left,Proximal,Plantar Foot: Calcium Alginate with Silver - ensure to pack lightly into any depth. Wound #  2 Left,Distal,Plantar Foot: Calcium Alginate with Silver Wound #3 Left Metatarsal head first: Calcium Alginate with Silver Secondary Dressing: Kerlix/Rolled Gauze Dry Gauze ABD pad Edema Control: Avoid standing for long periods of time Elevate legs to the level of the heart or above for 30 minutes daily and/or when sitting, a frequency of: - throughout the day. Off-Loading: Open toe surgical shoe to: - left foot. ensure no pressure to bottom of left foot. use arms to push wheelchair around not your foot. Home Health: [facility] for wound care. - [facility] twice a week home  health. wound center weekly. 1. I am going to continue silver alginate this week. 2. Consider changing the silver collagen is a packing next week 3. I have warned her to keep the pressure off the left foot as much as he can. I recognize she needs to pivot transfer etc. but she should not be using this to propel her wheelchair 4. She is taken Augmentin that I prescribed her for anaerobic coverage and she seems to be doing quite well. Not sure exactly of cause-and-effect here though. The wound is however a lot better in terms of surface this week 5. She has Medicare A but no supplement. She thinks she might be Medicaid eligible. This has implications for any advanced treatment products. I would love to be able to use Dermagraft here Electronic Signature(s) Signed: 11/01/2019 5:01:15 PM By: Linton Ham MD Entered By: Linton Ham on 11/01/2019 16:45:38 -------------------------------------------------------------------------------- SuperBill Details Patient Name: Date of Service: [name] 11/01/2019 Medical Record Number: [id number] Patient Account Number: [id number] Date of Birth/Sex: Treating RN: 27-Dec-1965 (53 y.o. [name], [name] Primary Care Provider: [name] Other Clinician: Referring Provider: Treating Provider/Extender: Tonette Bihari, [name] in Treatment: 9 Diagnosis Coding ICD-10 Codes Code Description E11.621 Type 2 diabetes mellitus with foot ulcer L97.523 Non-pressure chronic ulcer of other part of left foot with necrosis of muscle Z89.511 Acquired absence of right leg below knee M86.672 Other chronic osteomyelitis, left ankle and foot Facility Procedures CPT4 Code: [id number] Description: [id number] - WOUND CARE VISIT-LEV 5 EST PT Modifier: Quantity: 1 Physician Procedures : CPT4 Code Description Modifier [id number] - WC PHYS LEVEL 4 - EST PT ICD-10 Diagnosis Description E11.621 Type 2 diabetes mellitus with foot ulcer L97.523  Non-pressure chronic ulcer of other part of left foot with necrosis of muscle Quantity: 1 Electronic Signature(s) Signed: 11/01/2019 5:01:15 PM By: Linton Ham MD Entered By: Linton Ham on 11/01/2019 16:45:59

## 2019-11-09 ENCOUNTER — Other Ambulatory Visit: Payer: Self-pay

## 2019-11-09 ENCOUNTER — Encounter (HOSPITAL_BASED_OUTPATIENT_CLINIC_OR_DEPARTMENT_OTHER): Payer: Medicare Other | Attending: Internal Medicine | Admitting: Internal Medicine

## 2019-11-09 ENCOUNTER — Other Ambulatory Visit (HOSPITAL_COMMUNITY)
Admission: RE | Admit: 2019-11-09 | Discharge: 2019-11-09 | Disposition: A | Discharge status: Home / Self Care | Payer: Medicare Other | Source: Other Acute Inpatient Hospital | Attending: Internal Medicine | Admitting: Internal Medicine

## 2019-11-09 DIAGNOSIS — S91302A Unspecified open wound, left foot, initial encounter: Secondary | ICD-10-CM | POA: Diagnosis present

## 2019-11-09 DIAGNOSIS — E1151 Type 2 diabetes mellitus with diabetic peripheral angiopathy without gangrene: Secondary | ICD-10-CM | POA: Diagnosis not present

## 2019-11-09 DIAGNOSIS — Z89511 Acquired absence of right leg below knee: Secondary | ICD-10-CM | POA: Diagnosis not present

## 2019-11-09 DIAGNOSIS — D631 Anemia in chronic kidney disease: Secondary | ICD-10-CM | POA: Diagnosis not present

## 2019-11-09 DIAGNOSIS — E1142 Type 2 diabetes mellitus with diabetic polyneuropathy: Secondary | ICD-10-CM | POA: Insufficient documentation

## 2019-11-09 DIAGNOSIS — L97523 Non-pressure chronic ulcer of other part of left foot with necrosis of muscle: Secondary | ICD-10-CM | POA: Diagnosis not present

## 2019-11-09 DIAGNOSIS — E11621 Type 2 diabetes mellitus with foot ulcer: Secondary | ICD-10-CM | POA: Diagnosis not present

## 2019-11-09 DIAGNOSIS — Z993 Dependence on wheelchair: Secondary | ICD-10-CM | POA: Diagnosis not present

## 2019-11-09 DIAGNOSIS — E1122 Type 2 diabetes mellitus with diabetic chronic kidney disease: Secondary | ICD-10-CM | POA: Insufficient documentation

## 2019-11-09 DIAGNOSIS — M86672 Other chronic osteomyelitis, left ankle and foot: Secondary | ICD-10-CM | POA: Insufficient documentation

## 2019-11-09 DIAGNOSIS — I130 Hypertensive heart and chronic kidney disease with heart failure and stage 1 through stage 4 chronic kidney disease, or unspecified chronic kidney disease: Secondary | ICD-10-CM | POA: Diagnosis not present

## 2019-11-09 DIAGNOSIS — I5032 Chronic diastolic (congestive) heart failure: Secondary | ICD-10-CM | POA: Diagnosis not present

## 2019-11-09 DIAGNOSIS — N183 Chronic kidney disease, stage 3 unspecified: Secondary | ICD-10-CM | POA: Diagnosis not present

## 2019-11-09 DIAGNOSIS — X58XXXA Exposure to other specified factors, initial encounter: Secondary | ICD-10-CM | POA: Insufficient documentation

## 2019-11-09 NOTE — Progress Notes (Addendum)
[name], [name] ([id number]) Visit Report for 11/09/2019 Arrival Information Details Patient Name: Date of Service: Cheryl RTIN, [name] 11/09/2019 3:00 PM Medical Record Number: [id number] Patient Account Number: [id number] Date of Birth/Sex: Treating RN: 06/30/1965 (53 y.o. F) Epps, Carrie Primary Care Provider: King, Crystal Other Clinician: Referring Provider: Treating Provider/Extender: Robson, Michael King, Crystal Weeks in Treatment: 10 Visit Information History Since Last Visit All ordered tests and consults were completed: No Patient Arrived: Wheel Chair Added or deleted any medications: No Arrival Time: 15:41 Any new allergies or adverse reactions: No Accompanied By: self Had a fall or experienced change in No Transfer Assistance: None activities of daily living that may affect Patient Identification Verified: Yes risk of falls: Secondary Verification Process Completed: Yes Signs or symptoms of abuse/neglect since last visito No Patient Requires Transmission-Based Precautions: No Hospitalized since last visit: No Patient Has Alerts: No Implantable device outside of the clinic excluding No cellular tissue based products placed in the center since last visit: Has Dressing in Place as Prescribed: Yes Pain Present Now: No Electronic Signature(s) Signed: 11/09/2019 5:00:24 PM By: Epps, Carrie RN Entered By: Epps, Carrie on 11/09/2019 15:42:10 -------------------------------------------------------------------------------- Encounter Discharge Information Details Patient Name: Date of Service: Cheryl RTIN, [name] 11/09/2019 3:00 PM Medical Record Number: [id number] Patient Account Number: [id number] Date of Birth/Sex: Treating RN: 01/10/1965 (53 y.o. F) Deaton, Bobbi Primary Care Provider: King, Crystal Other Clinician: Referring Provider: Treating Provider/Extender: Robson, Michael King, Crystal Weeks in Treatment: 10 Encounter Discharge Information Items Post  Procedure Vitals Discharge Condition: Stable Temperature (F): 99 Ambulatory Status: Wheelchair Pulse (bpm): 73 Discharge Destination: Home Respiratory Rate (breaths/min): 18 Transportation: Private Auto Blood Pressure (mmHg): 146/74 Accompanied By: self Schedule Follow-up Appointment: Yes Clinical Summary of Care: Electronic Signature(s) Signed: 11/09/2019 5:41:13 PM By: Deaton, Bobbi Entered By: Deaton, Bobbi on 11/09/2019 17:37:19 -------------------------------------------------------------------------------- Lower Extremity Assessment Details Patient Name: Date of Service: Cheryl RTIN, [name] 11/09/2019 3:00 PM Medical Record Number: [id number] Patient Account Number: [id number] Date of Birth/Sex: Treating RN: 01/29/1965 (53 y.o. F) Epps, Carrie Primary Care Provider: King, Crystal Other Clinician: Referring Provider: Treating Provider/Extender: Robson, Michael King, Crystal Weeks in Treatment: 10 Edema Assessment Assessed: [Left: No] [Right: No] Edema: [Left: Ye] [Right: s] Calf Left: Right: Point of Measurement: From Medial Instep 42 cm Ankle Left: Right: Point of Measurement: From Medial Instep 25.9 cm Electronic Signature(s) Signed: 11/09/2019 5:00:24 PM By: Epps, Carrie RN Entered By: Epps, Carrie on 11/09/2019 15:53:29 -------------------------------------------------------------------------------- Multi Wound Chart Details Patient Name: Date of Service: Cheryl RTIN, [name] 11/09/2019 3:00 PM Medical Record Number: [id number] Patient Account Number: [id number] Date of Birth/Sex: Treating RN: 09/15/1965 (53 y.o. F) Boehlein, Linda Primary Care Provider: King, Crystal Other Clinician: Referring Provider: Treating Provider/Extender: Robson, Michael King, Crystal Weeks in Treatment: 10 Vital Signs Height(in): 67 Pulse(bpm): 73 Weight(lbs): 200 Blood Pressure(mmHg): 146/74 Body Mass Index(BMI): 31 Temperature(°F): 99 Respiratory Rate(breaths/min):  18 Photos: [1:No Photos Left, Proximal, Plantar Foot] [2:No Photos Left, Distal, Plantar Foot] [3:No Photos Left Metatarsal head first] Wound Location: [1:Blister] [2:Blister] [3:Blister] Wounding Event: [1:Diabetic Wound/Ulcer of the Lower] [2:Diabetic Wound/Ulcer of the Lower] [3:Diabetic Wound/Ulcer of the Lower] Primary Etiology: [1:Extremity Cataracts, Anemia, Congestive Heart] [2:Extremity Cataracts, Anemia, Congestive Heart] [3:Extremity Cataracts, Anemia, Congestive Heart] Comorbid History: [1:Failure, Hypertension, Type II Diabetes, Osteomyelitis, Neuropathy 08/02/2019] [2:Failure, Hypertension, Type II Diabetes, Osteomyelitis, Neuropathy 08/02/2019] [3:Failure, Hypertension, Type II Diabetes, Osteomyelitis, Neuropathy  08/02/2019] Date Acquired: [1:10] [2:10] [3:10] Weeks of Treatment: [1:Open] [2:Open] [3:Open] Wound Status: [1:1x2.1x0.3] [2:1.4x3x1.4] [3:0.3x0.4x0.3] Measurements L x   W x D (cm) [1:1.649] [2:3.299] [3:0.094] A (cm) : rea [1:0.495] [9:3.734] [3:0.028] Volume (cm) : [1:62.50%] [2:-40.00%] [3:87.50%] % Reduction in A rea: [1:95.30%] [2:-96.00%] [3:94.70%] % Reduction in Volume: [1:Grade 2] [2:Grade 2] [3:Grade 3] Classification: [1:Medium] [2:Medium] [3:Small] Exudate A mount: [1:Serosanguineous] [2:Serosanguineous] [3:Serosanguineous] Exudate Type: [1:red, brown] [2:red, brown] [3:red, brown] Exudate Color: [1:Well defined, not attached] [2:Epibole] [3:Well defined, not attached] Wound Margin: [1:Medium (34-66%)] [2:Medium (34-66%)] [3:Medium (34-66%)] Granulation Amount: [1:Red] [2:Red] [3:Red, Pink] Granulation Quality: [1:Medium (34-66%)] [2:Medium (34-66%)] [3:Medium (34-66%)] Necrotic Amount: [1:Fat Layer (Subcutaneous Tissue): Yes Fat Layer (Subcutaneous Tissue): Yes Fat Layer (Subcutaneous Tissue): Yes] Exposed Structures: [1:Muscle: Yes Fascia: No Tendon: No Joint: No Bone: No None] [2:Fascia: No Tendon: No Muscle: No Joint: No Bone: No None] [3:Fascia: No  Tendon: No Muscle: No Joint: No Bone: No Small (1-33%)] Epithelialization: [1:N/A] [2:Debridement - Excisional] [3:Debridement - Excisional] Debridement: Pre-procedure Verification/Time Out N/A [2:16:25] [3:16:25] Taken: [1:N/A] [2:Other] [3:Other] Pain Control: [1:N/A] [2:Callus, Subcutaneous, Slough] [3:Callus, Subcutaneous, Slough] Tissue Debrided: [1:N/A] [2:Skin/Subcutaneous Tissue] [3:Skin/Subcutaneous Tissue] Level: [1:N/A] [2:4.2] [3:0.12] Debridement A (sq cm): [1:rea N/A] [2:Curette] [3:Curette] Instrument: [1:N/A] [2:Minimum] [3:Minimum] Bleeding: [1:N/A] [2:Pressure] [3:Pressure] Hemostasis A chieved: [1:N/A] [2:0] [3:0] Procedural Pain: [1:N/A] [2:0] [3:0] Post Procedural Pain: [1:N/A] [2:Procedure was tolerated well] [3:Procedure was tolerated well] Debridement Treatment Response: [1:N/A] [2:1.4x3x1.4] [3:0.3x0.4x0.3] Post Debridement Measurements L x W x D (cm) [1:N/A] [2:4.618] [3:0.028] Post Debridement Volume: (cm) [1:N/A] [2:Debridement] [3:Debridement] Wound Number: 4 N/A N/A Photos: No Photos N/A N/A Left, Lateral Foot N/A N/A Wound Location: Gradually Appeared N/A N/A Wounding Event: Diabetic Wound/Ulcer of the Lower N/A N/A Primary Etiology: Extremity Cataracts, Anemia, Congestive Heart N/A N/A Comorbid History: Failure, Hypertension, Type II Diabetes, Osteomyelitis, Neuropathy 11/05/2019 N/A N/A Date A cquired: 0 N/A N/A Weeks of Treatment: Open N/A N/A Wound Status: 0.4x0.4x1.7 N/A N/A Measurements L x W x D (cm) 0.126 N/A N/A A (cm) : rea 0.214 N/A N/A Volume (cm) : N/A N/A N/A % Reduction in A rea: N/A N/A N/A % Reduction in Volume: Grade 2 N/A N/A Classification: Medium N/A N/A Exudate A mount: Serosanguineous N/A N/A Exudate Type: red, brown N/A N/A Exudate Color: N/A N/A N/A Wound Margin: Small (1-33%) N/A N/A Granulation A mount: Red N/A N/A Granulation Quality: Large (67-100%) N/A N/A Necrotic A mount: Fat Layer  (Subcutaneous Tissue): Yes N/A N/A Exposed Structures: Fascia: No Tendon: No Muscle: No Joint: No Bone: No None N/A N/A Epithelialization: N/A N/A N/A Debridement: N/A N/A N/A Pain Control: N/A N/A N/A Tissue Debrided: N/A N/A N/A Level: N/A N/A N/A Debridement A (sq cm): rea N/A N/A N/A Instrument: N/A N/A N/A Bleeding: N/A N/A N/A Hemostasis A chieved: N/A N/A N/A Procedural Pain: N/A N/A N/A Post Procedural Pain: Debridement Treatment Response: N/A N/A N/A Post Debridement Measurements L x N/A N/A N/A W x D (cm) N/A N/A N/A Post Debridement Volume: (cm) N/A N/A N/A Procedures Performed: Treatment Notes Wound #1 (Left, Proximal, Plantar Foot) 1. Cleanse With Wound Cleanser Soap and water 3. Primary Dressing Applied Calcium Alginate Ag 4. Secondary Dressing ABD Pad Dry Gauze Roll Gauze 5. Secured With Medipore tape 7. Footwear/Offloading device applied Surgical shoe Notes netting. explained the orders, dressings, frequency of change, and when to return to wound center. Wound #2 (Left, Distal, Plantar Foot) 1. Cleanse With Wound Cleanser Soap and water 3. Primary Dressing Applied Calcium Alginate Ag 4. Secondary Dressing ABD Pad Dry Gauze Roll Gauze 5. Secured With Medipore tape 7. Footwear/Offloading device applied Surgical shoe  Notes netting. explained the orders, dressings, frequency of change, and when to return to wound center. Wound #3 (Left Metatarsal head first) 1. Cleanse With Wound Cleanser Soap and water 3. Primary Dressing Applied Calcium Alginate Ag 4. Secondary Dressing ABD Pad Dry Gauze Roll Gauze 5. Secured With Medipore tape 7. Footwear/Offloading device applied Surgical shoe Notes netting. explained the orders, dressings, frequency of change, and when to return to wound center. Wound #4 (Left, Lateral Foot) 1. Cleanse With Wound Cleanser Soap and water 3. Primary Dressing Applied Calcium Alginate Ag 4.  Secondary Dressing ABD Pad Dry Gauze Roll Gauze 5. Secured With Medipore tape 7. Footwear/Offloading device applied Surgical shoe Notes netting. explained the orders, dressings, frequency of change, and when to return to wound center. Electronic Signature(s) Signed: 11/12/2019 1:46:42 PM By: Linton Ham MD Signed: 11/12/2019 4:55:08 PM By: Baruch Gouty RN, [name] Entered By: Linton Ham on 11/11/2019 14:38:58 -------------------------------------------------------------------------------- Multi-Disciplinary Care Plan Details Patient Name: Date of Service: [name], Cheryl 11/09/2019 3:00 PM Medical Record Number: [id number] Patient Account Number: [id number] Date of Birth/Sex: Treating RN: 1965/08/27 (54 y.o. [name] Primary Care Keshav Winegar: [name] Other Clinician: Referring Remy Dia: Treating Daven Montz/Extender: Tonette Bihari, [name] in Treatment: 10 Active Inactive Abuse / Safety / Falls / Self Care Management Nursing Diagnoses: Potential for falls Potential for injury related to falls Goals: Patient will not experience any injury related to falls Date Initiated: 08/30/2019 Target Resolution Date: 12/07/2019 Goal Status: Active Patient/caregiver will verbalize/demonstrate measures taken to prevent injury and/or falls Date Initiated: 08/30/2019 Date Inactivated: 09/27/2019 Target Resolution Date: 09/28/2019 Goal Status: Met Interventions: Assess Activities of Daily Living upon admission and as needed Assess fall risk on admission and as needed Assess: immobility, friction, shearing, incontinence upon admission and as needed Assess impairment of mobility on admission and as needed per policy Assess personal safety and home safety (as indicated) on admission and as needed Assess self care needs on admission and as needed Provide education on fall prevention Provide education on personal and home safety Notes: Nutrition Nursing  Diagnoses: Impaired glucose control: actual or potential Potential for alteratiion in Nutrition/Potential for imbalanced nutrition Goals: Patient/caregiver agrees to and verbalizes understanding of need to use nutritional supplements and/or vitamins as prescribed Date Initiated: 08/30/2019 Date Inactivated: 10/11/2019 Target Resolution Date: 10/05/2019 Goal Status: Met Patient/caregiver will maintain therapeutic glucose control Date Initiated: 08/30/2019 Target Resolution Date: 12/07/2019 Goal Status: Active Interventions: Assess HgA1c results as ordered upon admission and as needed Assess patient nutrition upon admission and as needed per policy Provide education on elevated blood sugars and impact on wound healing Provide education on nutrition Treatment Activities: Education provided on Nutrition : 09/11/2019 Notes: Wound/Skin Impairment Nursing Diagnoses: Impaired tissue integrity Knowledge deficit related to ulceration/compromised skin integrity Goals: Patient/caregiver will verbalize understanding of skin care regimen Date Initiated: 08/30/2019 Target Resolution Date: 12/07/2019 Goal Status: Active Interventions: Assess patient/caregiver ability to obtain necessary supplies Assess patient/caregiver ability to perform ulcer/skin care regimen upon admission and as needed Assess ulceration(s) every visit Provide education on ulcer and skin care Notes: Electronic Signature(s) Signed: 11/09/2019 6:06:29 PM By: Baruch Gouty RN, [name] Entered By: Baruch Gouty on 11/09/2019 16:12:24 -------------------------------------------------------------------------------- Pain Assessment Details Patient Name: Date of Service: Cheryl Bailey. 11/09/2019 3:00 PM Medical Record Number: [id number] Patient Account Number: [id number] Date of Birth/Sex: Treating RN: 10-Mar-1965 (54 y.o. Orvan Falconer Primary Care Victoire Deans: [name] Other Clinician: Referring Shamari Trostel: Treating  Aniko Finnigan/Extender: Tonette Bihari, [name] in Treatment: 10 Active  Problems Location of Pain Severity and Description of Pain Patient Has Paino No Site Locations Pain Management and Medication Current Pain Management: Electronic Signature(s) Signed: 11/09/2019 5:00:24 PM By: Epps, Carrie RN Entered By: Epps, Carrie on 11/09/2019 15:42:39 -------------------------------------------------------------------------------- Patient/Caregiver Education Details Patient Name: Date of Service: Cheryl RTIN, Junior L. 11/5/2021andnbsp3:00 PM Medical Record Number: [id number] Patient Account Number: [id number] Date of Birth/Gender: Treating RN: 12/18/1965 (53 y.o. F) Boehlein, Linda Primary Care Physician: King, Crystal Other Clinician: Referring Physician: Treating Physician/Extender: Robson, Michael King, Crystal Weeks in Treatment: 10 Education Assessment Education Provided To: Patient Education Topics Provided Infection: Methods: Explain/Verbal Responses: Reinforcements needed, State content correctly Wound/Skin Impairment: Methods: Explain/Verbal Responses: Reinforcements needed, State content correctly Electronic Signature(s) Signed: 11/09/2019 6:06:29 PM By: Boehlein, Linda RN, [name] Entered By: Boehlein, Linda on 11/09/2019 16:13:14 -------------------------------------------------------------------------------- Wound Assessment Details Patient Name: Date of Service: Cheryl RTIN, Aliza L. 11/09/2019 3:00 PM Medical Record Number: [id number] Patient Account Number: [id number] Date of Birth/Sex: Treating RN: 12/29/1965 (53 y.o. F) Epps, Carrie Primary Care Provider: King, Crystal Other Clinician: Referring Provider: Treating Provider/Extender: Robson, Michael King, Crystal Weeks in Treatment: 10 Wound Status Wound Number: 1 Primary Diabetic Wound/Ulcer of the Lower Extremity Etiology: Wound Location: Left, Proximal, Plantar Foot Wound Open Wounding Event:  Blister Status: Date Acquired: 08/02/2019 Comorbid Cataracts, Anemia, Congestive Heart Failure, Hypertension, Type Weeks Of Treatment: 10 History: II Diabetes, Osteomyelitis, Neuropathy Clustered Wound: No Wound Measurements Length: (cm) 1 Width: (cm) 2.1 Depth: (cm) 0.3 Area: (cm) 1.649 Volume: (cm) 0.495 % Reduction in Area: 62.5% % Reduction in Volume: 95.3% Epithelialization: None Tunneling: No Undermining: No Wound Description Classification: Grade 2 Wound Margin: Well defined, not attached Exudate Amount: Medium Exudate Type: Serosanguineous Exudate Color: red, brown Foul Odor After Cleansing: No Slough/Fibrino Yes Wound Bed Granulation Amount: Medium (34-66%) Exposed Structure Granulation Quality: Red Fascia Exposed: No Necrotic Amount: Medium (34-66%) Fat Layer (Subcutaneous Tissue) Exposed: Yes Necrotic Quality: Adherent Slough Tendon Exposed: No Muscle Exposed: Yes Necrosis of Muscle: Yes Joint Exposed: No Bone Exposed: No Treatment Notes Wound #1 (Left, Proximal, Plantar Foot) 1. Cleanse With Wound Cleanser Soap and water 3. Primary Dressing Applied Calcium Alginate Ag 4. Secondary Dressing ABD Pad Dry Gauze Roll Gauze 5. Secured With Medipore tape 7. Footwear/Offloading device applied Surgical shoe Notes netting. explained the orders, dressings, frequency of change, and when to return to wound center. Electronic Signature(s) Signed: 11/09/2019 5:00:24 PM By: Epps, Carrie RN Entered By: Epps, Carrie on 11/09/2019 15:54:43 -------------------------------------------------------------------------------- Wound Assessment Details Patient Name: Date of Service: Cheryl RTIN, [name] 11/09/2019 3:00 PM Medical Record Number: [id number] Patient Account Number: [id number] Date of Birth/Sex: Treating RN: 08/10/1965 (53 y.o. F) Epps, Carrie Primary Care Provider: King, Crystal Other Clinician: Referring Provider: Treating Provider/Extender: Robson,  Michael King, Crystal Weeks in Treatment: 10 Wound Status Wound Number: 2 Primary Diabetic Wound/Ulcer of the Lower Extremity Etiology: Wound Location: Left, Distal, Plantar Foot Wound Open Wounding Event: Blister Status: Date Acquired: 08/02/2019 Comorbid Cataracts, Anemia, Congestive Heart Failure, Hypertension, Type Weeks Of Treatment: 10 History: II Diabetes, Osteomyelitis, Neuropathy Clustered Wound: No Wound Measurements Length: (cm) 1.4 Width: (cm) 3 Depth: (cm) 1.4 Area: (cm) 3.299 Volume: (cm) 4.618 % Reduction in Area: -40% % Reduction in Volume: -96% Epithelialization: None Tunneling: No Undermining: No Wound Description Classification: Grade 2 Wound Margin: Epibole Exudate Amount: Medium Exudate Type: Serosanguineous Exudate Color: red, brown Foul Odor After Cleansing: No Slough/Fibrino Yes Wound Bed Granulation Amount: Medium (34-66%) Exposed Structure Granulation Quality: Red Fascia Exposed: No Necrotic Amount: Medium (  34-66%) Fat Layer (Subcutaneous Tissue) Exposed: Yes Necrotic Quality: Adherent Slough Tendon Exposed: No Muscle Exposed: No Joint Exposed: No Bone Exposed: No Treatment Notes Wound #2 (Left, Distal, Plantar Foot) 1. Cleanse With Wound Cleanser Soap and water 3. Primary Dressing Applied Calcium Alginate Ag 4. Secondary Dressing ABD Pad Dry Gauze Roll Gauze 5. Secured With Medipore tape 7. Footwear/Offloading device applied Surgical shoe Notes netting. explained the orders, dressings, frequency of change, and when to return to wound center. Electronic Signature(s) Signed: 11/09/2019 5:00:24 PM By: Epps, Carrie RN Entered By: Epps, Carrie on 11/09/2019 15:55:07 -------------------------------------------------------------------------------- Wound Assessment Details Patient Name: Date of Service: Cheryl RTIN, [name] 11/09/2019 3:00 PM Medical Record Number: [id number] Patient Account Number: [id number] Date of  Birth/Sex: Treating RN: 02/24/1965 (53 y.o. F) Epps, Carrie Primary Care Provider: King, Crystal Other Clinician: Referring Provider: Treating Provider/Extender: Robson, Michael King, Crystal Weeks in Treatment: 10 Wound Status Wound Number: 3 Primary Diabetic Wound/Ulcer of the Lower Extremity Etiology: Wound Location: Left Metatarsal head first Wound Open Wounding Event: Blister Status: Date Acquired: 08/02/2019 Comorbid Cataracts, Anemia, Congestive Heart Failure, Hypertension, Type Weeks Of Treatment: 10 History: II Diabetes, Osteomyelitis, Neuropathy Clustered Wound: No Wound Measurements Length: (cm) 0.3 Width: (cm) 0.4 Depth: (cm) 0.3 Area: (cm) 0.094 Volume: (cm) 0.028 % Reduction in Area: 87.5% % Reduction in Volume: 94.7% Epithelialization: Small (1-33%) Tunneling: No Undermining: No Wound Description Classification: Grade 3 Wound Margin: Well defined, not attached Exudate Amount: Small Exudate Type: Serosanguineous Exudate Color: red, brown Foul Odor After Cleansing: No Slough/Fibrino Yes Wound Bed Granulation Amount: Medium (34-66%) Exposed Structure Granulation Quality: Red, Pink Fascia Exposed: No Necrotic Amount: Medium (34-66%) Fat Layer (Subcutaneous Tissue) Exposed: Yes Tendon Exposed: No Muscle Exposed: No Joint Exposed: No Bone Exposed: No Treatment Notes Wound #3 (Left Metatarsal head first) 1. Cleanse With Wound Cleanser Soap and water 3. Primary Dressing Applied Calcium Alginate Ag 4. Secondary Dressing ABD Pad Dry Gauze Roll Gauze 5. Secured With Medipore tape 7. Footwear/Offloading device applied Surgical shoe Notes netting. explained the orders, dressings, frequency of change, and when to return to wound center. Electronic Signature(s) Signed: 11/09/2019 5:00:24 PM By: Epps, Carrie RN Entered By: Epps, Carrie on 11/09/2019 15:55:31 -------------------------------------------------------------------------------- Wound  Assessment Details Patient Name: Date of Service: Cheryl RTIN, [name] 11/09/2019 3:00 PM Medical Record Number: [id number] Patient Account Number: [id number] Date of Birth/Sex: Treating RN: 09/06/1965 (53 y.o. F) Epps, Carrie Primary Care Provider: King, Crystal Other Clinician: Referring Provider: Treating Provider/Extender: Robson, Michael King, Crystal Weeks in Treatment: 10 Wound Status Wound Number: 4 Primary Diabetic Wound/Ulcer of the Lower Extremity Etiology: Wound Location: Left, Lateral Foot Wound Open Wounding Event: Gradually Appeared Status: Date Acquired: 11/05/2019 Comorbid Cataracts, Anemia, Congestive Heart Failure, Hypertension, Type Weeks Of Treatment: 0 History: II Diabetes, Osteomyelitis, Neuropathy Clustered Wound: No Wound Measurements Length: (cm) 0.4 Width: (cm) 0.4 Depth: (cm) 1.7 Area: (cm) 0.126 Volume: (cm) 0.214 % Reduction in Area: % Reduction in Volume: Epithelialization: None Tunneling: No Undermining: No Wound Description Classification: Grade 2 Exudate Amount: Medium Exudate Type: Serosanguineous Exudate Color: red, brown Foul Odor After Cleansing: No Slough/Fibrino Yes Wound Bed Granulation Amount: Small (1-33%) Exposed Structure Granulation Quality: Red Fascia Exposed: No Necrotic Amount: Large (67-100%) Fat Layer (Subcutaneous Tissue) Exposed: Yes Necrotic Quality: Adherent Slough Tendon Exposed: No Muscle Exposed: No Joint Exposed: No Bone Exposed: No Treatment Notes Wound #4 (Left, Lateral Foot) 1. Cleanse With Wound Cleanser Soap and water 3. Primary Dressing Applied Calcium Alginate Ag 4. Secondary Dressing ABD Pad   Dry Gauze Roll Gauze 5. Secured With Medipore tape 7. Footwear/Offloading device applied Surgical shoe Notes netting. explained the orders, dressings, frequency of change, and when to return to wound center. Electronic Signature(s) Signed: 11/09/2019 5:00:24 PM By: Epps, Carrie RN Entered By:  Epps, Carrie on 11/09/2019 15:57:08 -------------------------------------------------------------------------------- Vitals Details Patient Name: Date of Service: Cheryl RTIN, [name] 11/09/2019 3:00 PM Medical Record Number: [id number] Patient Account Number: [id number] Date of Birth/Sex: Treating RN: 10/28/1965 (53 y.o. F) Epps, Carrie Primary Care Provider: King, Crystal Other Clinician: Referring Provider: Treating Provider/Extender: Robson, Michael King, Crystal Weeks in Treatment: 10 Vital Signs Time Taken: 15:42 Temperature (°F): 99 Height (in): 67 Pulse (bpm): 73 Weight (lbs): 200 Respiratory Rate (breaths/min): 18 Body Mass Index (BMI): 31.3 Blood Pressure (mmHg): 146/74 Reference Range: 80 - 120 mg / dl Electronic Signature(s) Signed: 11/09/2019 5:00:24 PM By: Epps, Carrie RN Entered By: Epps, Carrie on 11/09/2019 15:42:32 

## 2019-11-12 ENCOUNTER — Other Ambulatory Visit (HOSPITAL_COMMUNITY): Payer: Self-pay | Admitting: Internal Medicine

## 2019-11-12 DIAGNOSIS — E08621 Diabetes mellitus due to underlying condition with foot ulcer: Secondary | ICD-10-CM

## 2019-11-12 DIAGNOSIS — L97529 Non-pressure chronic ulcer of other part of left foot with unspecified severity: Secondary | ICD-10-CM

## 2019-11-12 NOTE — Progress Notes (Signed)
ROCKY, RISHEL ([id number]) Visit Report for 11/09/2019 Debridement Details Patient Name: Date of Service: [name], [name] 11/09/2019 3:00 PM Medical Record Number: [id number] Patient Account Number: [id number] Date of Birth/Sex: Treating RN: 1965/02/28 (54 y.o. [name] Primary Care Provider: Dionisio Wolf Other Clinician: Referring Provider: Treating Provider/Extender: [name], [name] in Treatment: 10 Debridement Performed for Assessment: Wound #2 Left,Distal,Plantar Foot Performed By: Physician [name]., MD Debridement Type: Debridement Severity of Tissue Pre Debridement: Fat layer exposed Level of Consciousness (Pre-procedure): Awake and Alert Pre-procedure Verification/Time Out Yes - 16:25 Taken: Start Time: 16:25 Pain Control: Other : benzocaine 20% spray T Area Debrided (L x W): otal 1.4 (cm) x 3 (cm) = 4.2 (cm) Tissue and other material debrided: Viable, Non-Viable, Callus, Slough, Subcutaneous, Slough Level: Skin/Subcutaneous Tissue Debridement Description: Excisional Instrument: Curette Bleeding: Minimum Hemostasis Achieved: Pressure End Time: 16:30 Procedural Pain: 0 Post Procedural Pain: 0 Response to Treatment: Procedure was tolerated well Level of Consciousness (Post- Awake and Alert procedure): Post Debridement Measurements of Total Wound Length: (cm) 1.4 Width: (cm) 3 Depth: (cm) 1.4 Volume: (cm) 4.618 Character of Wound/Ulcer Post Debridement: Requires Further Debridement Severity of Tissue Post Debridement: Fat layer exposed Post Procedure Diagnosis Same as Pre-procedure Electronic Signature(s) Signed: 11/12/2019 1:46:42 PM By: Cheryl Ham MD Signed: 11/12/2019 4:55:08 PM By: Cheryl Gouty RN, [name] Previous Signature: 11/09/2019 6:06:29 PM Version By: Cheryl Gouty RN, [name] Entered By: [name] on 11/11/2019  14:39:12 -------------------------------------------------------------------------------- Debridement Details Patient Name: Date of Service: [name], [name] 11/09/2019 3:00 PM Medical Record Number: [id number] Patient Account Number: [id number] Date of Birth/Sex: Treating RN: 06-02-1965 (54 y.o. [name] Primary Care Provider: Dionisio Wolf Other Clinician: Referring Provider: Treating Provider/Extender: [name], [name] in Treatment: 10 Debridement Performed for Assessment: Wound #3 Left Metatarsal head first Performed By: Physician [name]., MD Debridement Type: Debridement Severity of Tissue Pre Debridement: Fat layer exposed Level of Consciousness (Pre-procedure): Awake and Alert Pre-procedure Verification/Time Out Yes - 16:25 Taken: Start Time: 16:25 Pain Control: Other : benzocaine 20% spray T Area Debrided (L x W): otal 0.3 (cm) x 0.4 (cm) = 0.12 (cm) Tissue and other material debrided: Viable, Non-Viable, Callus, Slough, Subcutaneous, Slough Level: Skin/Subcutaneous Tissue Debridement Description: Excisional Instrument: Curette Bleeding: Minimum Hemostasis Achieved: Pressure End Time: 16:30 Procedural Pain: 0 Post Procedural Pain: 0 Response to Treatment: Procedure was tolerated well Level of Consciousness (Post- Awake and Alert procedure): Post Debridement Measurements of Total Wound Length: (cm) 0.3 Width: (cm) 0.4 Depth: (cm) 0.3 Volume: (cm) 0.028 Character of Wound/Ulcer Post Debridement: Improved Severity of Tissue Post Debridement: Fat layer exposed Post Procedure Diagnosis Same as Pre-procedure Electronic Signature(s) Signed: 11/12/2019 1:46:42 PM By: Cheryl Ham MD Signed: 11/12/2019 4:55:08 PM By: Cheryl Gouty RN, [name] Previous Signature: 11/09/2019 6:06:29 PM Version By: Cheryl Gouty RN, [name] Entered By: [name] on 11/11/2019  14:39:27 -------------------------------------------------------------------------------- HPI Details Patient Name: Date of Service: Cheryl [name]. 11/09/2019 3:00 PM Medical Record Number: [id number] Patient Account Number: [id number] Date of Birth/Sex: Treating RN: 1965-11-20 (54 y.o. [name] Primary Care Provider: Dionisio Wolf Other Clinician: Referring Provider: Treating Provider/Extender: [name], [name] in Treatment: 10 History of Present Illness HPI Description: 28ADMISSION 08/30/2019 This is a 54 year old woman with type 2 diabetes and peripheral neuropathy. She developed a blister on the plantar left foot on 7/27. She was seen in the emergency room treated. This did not get any better and she required admission to  the hospital from 08/02/2019 through 08/09/2019. She was felt to have cellulitis of the left foot. She was treated with IV medications/antibiotics discharged on Augmentin however she could not tolerate this and she was changed to doxycycline for 5 days she has completed this. She has advanced home care they are apparently putting Xeroform over the wound. When she left the hospital she was supposed to be putting Santyl over the black areas on the left plantar foot covered with Xeroform. X-rays done in the hospital did not show evidence of osteomyelitis only a superficial plantar ulceration. She had blood cultures but I do not see a wound culture. The patient has a past medical history of a right below-knee amputation by Dr. Doran Wolf in 2012 presumably for infection in this area although I am not certain. As noted she is a type II diabetic on oral agents with a recent hemoglobin A1c of 5.5. Other past medical history includes ITP, peripheral edema, chronic kidney disease stage III, hypertension, right below-knee amputation, diastolic heart failure. She tells me she is wheelchair-bound and is nonweightbearing although she does push the wheelchair  along with the left foot. She is only using hospital sock on the left foot. ABI in our clinic was 1.06 on the left 9/7; the patient cultured Pseudomonas last week out of the deeper proximal wound. MRI that was ordered showed progressive soft tissue ulceration along the plantar aspect of the foot especially along the medial arch but also plantar to the first metatarsal phalangeal joint no drainable abscess or foreign body. There was a suspicion for early osteomyelitis in the fifth metatarsal base but she does not have a wound in this area. Also problematically is that where we put her prescription for ciprofloxacin and they did not have the antibiotic and we verified that they are out of ciprofloxacin. We did not receive the message from them to this effect The patient did see Dr. [name] of podiatry. The wound was debrided. He prescribed Santyl which I do not think she is picked up either. Lab work including a CBC sedimentation rate and C-reactive protein were ordered. Finally she is using silver alginate she has home health changing the dressing 3 times a week she does not disturb this in between times 9/23; we have not seen this lady in 2-1/2 weeks largely because of transportation issues. She still has 3 wound areas a deep area in the midfoot and area just above this and then an area on the first met head. She has been using silver alginate on the wound. They are trying to use home health however they are trying to teach her how to do this at be been very difficult for this patient to do so. In discussing things with her she uses this foot to propel her wheelchair I have asked her to stop doing this and to use her arms. She states that she is only moving around in her house. We will need to have a look at her lab work that I quoted from the last time she was here that was ordered by Dr. [name] of podiatry. By our MRI she does not have osteomyelitis in any of the wounded areas. 10/7 2-week  follow-up. The patient has 3 wounds including superficially over the left first metatarsal head, left first submetatarsal in the lateral foot and a deep area more distally. Her MRI did not show osteomyelitis in reference to the wounds that we are treating although did suggest an early possibility in the left fifth  metatarsal head however there is no wound in this area. I have reviewed her inflammatory markers. Most recently on 7/26 she had a sedimentation rate of 117 although this was previously greater than 140 and then 108. Last CRP was on 8/20 at 5.1 comparing to previously at 4.27. Noteworthy that this is when she was in the hospital in July I believe. 10/19; patient missed her appointment last week because of transportation issues. We have been using silver collagen. She still has the area over the metatarsal head and the mid part of her foot which are superficial wounds about the same. Marked deterioration in the problematic distal wound. This now is right down into the fascial layer of her foot. Our intake nurse noted purulent drainage. She has a small satellite lesion towards the heel 10/28; PCR culture of the major wound she has on her proximal plantar foot grew both Peptostreptococcus and Corynebacterium. The Corynebacterium could easily be a skin contaminant but Peptostreptococcus deserve treatment and I started her on Augmentin she arrives in clinic today with the wound looking a lot better 11/5; Finishing her augmentin. Patient's foot is not doing well. Now with a probing area laterally of the major wound going laterally. she presents with a new wound on the lateral part of the left foot with purulent drainage. this tunnels widely and toward the plantar foot. It does not connect with the major plantar wound however I am concerned Electronic Signature(s) Signed: 11/12/2019 1:46:42 PM By: Cheryl Ham MD Entered By: [name] on 11/11/2019  14:45:58 -------------------------------------------------------------------------------- Physical Exam Details Patient Name: Date of Service: [name], [name] 11/09/2019 3:00 PM Medical Record Number: [id number] Patient Account Number: [id number] Date of Birth/Sex: Treating RN: January 12, 1965 (54 y.o. [name] Primary Care Provider: Dionisio Wolf Other Clinician: Referring Provider: Treating Provider/Extender: [name], [name] in Treatment: 10 Constitutional Patient is hypertensive.. Pulse regular and within target range for patient.Marland Kitchen Respirations regular, non-labored and within target range.. Temperature is normal and within the target range for the patient.Marland Kitchen Appears in no distress. Does not look systemically ill. Notes Wound exam; 1 New wound lateral left foot with tunneling widely most worrisome toward the plantar foot. Culture 2 more extensive breakdown in the lateral part of the deep plantar wound. 3 superficial wound met head and lateral foot require debridment Electronic Signature(s) Signed: 11/12/2019 1:46:42 PM By: Cheryl Ham MD Entered By: [name] on 11/11/2019 14:54:50 -------------------------------------------------------------------------------- Physician Orders Details Patient Name: Date of Service: [name], [name] 11/09/2019 3:00 PM Medical Record Number: [id number] Patient Account Number: [id number] Date of Birth/Sex: Treating RN: May 17, 1965 (54 y.o. 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 Dressing Change Frequency Change dressing three times week. - Twice a week home health. wound center weekly. Wound Cleansing Clean wound with Wound Cleanser - or normal saline - all wounds Primary Wound Dressing Wound #1 Left,Proximal,Plantar Foot lginate with Silver - ensure to pack lightly into any depth. Calcium A Wound #4 Left,Lateral Foot lginate with Silver - ensure to pack lightly into any depth. Calcium A Wound #2 Left,Distal,Plantar Foot Calcium  Alginate with Silver Wound #3 Left Metatarsal head first Calcium Alginate with Silver Secondary Dressing Kerlix/Rolled Gauze Dry Gauze ABD pad Edema Control Avoid standing for long periods of time Elevate legs to the level of the heart or above for 30 minutes daily and/or when sitting, a frequency of: - throughout the day. Off-Loading Open toe surgical shoe to: - left foot. ensure no pressure to bottom of left foot. use arms to push wheelchair around not your foot. [facility] for wound care. - [facility] twice a week home health. wound center weekly. Laboratory naerobe culture (MICRO) - left lateral foot Bacteria identified in Unspecified specimen by A LOINC Code: 626-9 Convenience Name: Anerobic culture Radiology MRI, lower extremity with/without contrast left foot - worsening left foot diabetic foot ulcer left lateral foot - (ICD10 L97.523 - Non-pressure chronic ulcer of other part of left foot with necrosis of muscle) Electronic Signature(s) Signed: 11/09/2019 6:06:29 PM By: Cheryl Gouty RN, [name] Signed: 11/12/2019 1:46:42 PM By: Cheryl Ham MD Entered By: [name] on 11/09/2019 16:34:54 Prescription 11/09/2019 -------------------------------------------------------------------------------- [place], [place] Cheryl Ham MD Patient Name: Provider: Jul 15, 1965 [id number] Date of  Birth: NPI#: [id number] Sex: DEA #: [phone] [id number] Phone #: License #: [place] Patient Address: [facility] [address] [place], [place] [address] [place], [place] [address] [phone] Allergies No Known Allergies Provider's Orders MRI, lower extremity with/without contrast left foot - ICD10: L97.523 - worsening left foot diabetic foot ulcer left lateral foot Hand Signature: Date(s): Electronic Signature(s) Signed: 11/09/2019 6:06:29 PM By: Cheryl Gouty RN, [name] Signed: 11/12/2019 1:46:42 PM By: Cheryl Ham MD Entered By: [name] on 11/09/2019 16:34:54 -------------------------------------------------------------------------------- Problem List Details Patient Name: Date of Service: [name], [name] 11/09/2019 3:00 PM Medical Record Number: [id number] Patient Account Number: [id number] Date of Birth/Sex: Treating RN: Mar 20, 1965 (55 y.o. [name] Primary Care Provider: Dionisio Wolf Other Clinician: Referring Provider: Treating Provider/Extender: [name], [name] in Treatment: 10 Active Problems ICD-10 Encounter Code Description Active Date MDM Diagnosis E11.621 Type 2 diabetes mellitus with foot ulcer 08/30/2019 No Yes L97.523 Non-pressure chronic ulcer of other part of left foot with necrosis of muscle 08/30/2019 No Yes Z89.511 Acquired absence of right leg below knee 08/30/2019 No Yes M86.672 Other chronic osteomyelitis, left ankle and foot 08/30/2019 No Yes Inactive Problems Resolved Problems Electronic Signature(s) Signed: 11/12/2019 1:46:42 PM By: Cheryl Ham MD Signed: 11/12/2019 1:46:42 PM By: Cheryl Ham MD Previous Signature: 11/09/2019 6:06:29 PM Version By: Cheryl Gouty RN, [name] Entered By: [name] on 11/11/2019 14:38:52 -------------------------------------------------------------------------------- Progress Note  Details Patient Name: Date of Service: [name], [name] 11/09/2019 3:00 PM Medical Record Number: [id number] Patient Account Number: [id number] Date of Birth/Sex: Treating RN: 19-Mar-1965 (54 y.o. [name] Primary Care Provider: Dionisio Wolf Other Clinician: Referring Provider: Treating Provider/Extender: [name], [name] in Treatment: 10 Subjective History of Present Illness (HPI) 28ADMISSION 08/30/2019 This is a 54 year old woman with type 2 diabetes and peripheral neuropathy. She developed a blister on the plantar left foot on 7/27. She was seen in the emergency room treated. This did not get any better and she required admission to the hospital from 08/02/2019 through 08/09/2019. She was felt to have cellulitis of the left foot. She was treated with IV medications/antibiotics discharged on Augmentin however she could not tolerate this and she was changed to doxycycline for 5 days she has completed this.  She has advanced home care they are apparently putting Xeroform over the wound. When she left the hospital she was supposed to be putting Santyl over the black areas on the left plantar foot covered with Xeroform. X-rays done in the hospital did not show evidence of osteomyelitis only a superficial plantar ulceration. She had blood cultures but I do not see a wound culture. The patient has a past medical history of a right below-knee amputation by Dr. Doran Wolf in 2012 presumably for infection in this area although I am not certain. As noted she is a type II diabetic on oral agents with a recent hemoglobin A1c of 5.5. Other past medical history includes ITP, peripheral edema, chronic kidney disease stage III, hypertension, right below-knee amputation, diastolic heart failure. She tells me she is wheelchair-bound and is nonweightbearing although she does push the wheelchair along with the left foot. She is only using hospital sock on the left foot. ABI in our clinic  was 1.06 on the left 9/7; the patient cultured Pseudomonas last week out of the deeper proximal wound. MRI that was ordered showed progressive soft tissue ulceration along the plantar aspect of the foot especially along the medial arch but also plantar to the first metatarsal phalangeal joint no drainable abscess or foreign body. There was a suspicion for early osteomyelitis in the fifth metatarsal base but she does not have a wound in this area. Also problematically is that where we put her prescription for ciprofloxacin and they did not have the antibiotic and we verified that they are out of ciprofloxacin. We did not receive the message from them to this effect The patient did see Dr. [name] of podiatry. The wound was debrided. He prescribed Santyl which I do not think she is picked up either. Lab work including a CBC sedimentation rate and C-reactive protein were ordered. Finally she is using silver alginate she has home health changing the dressing 3 times a week she does not disturb this in between times 9/23; we have not seen this lady in 2-1/2 weeks largely because of transportation issues. She still has 3 wound areas a deep area in the midfoot and area just above this and then an area on the first met head. She has been using silver alginate on the wound. They are trying to use home health however they are trying to teach her how to do this at be been very difficult for this patient to do so. In discussing things with her she uses this foot to propel her wheelchair I have asked her to stop doing this and to use her arms. She states that she is only moving around in her house. We will need to have a look at her lab work that I quoted from the last time she was here that was ordered by Dr. [name] of podiatry. By our MRI she does not have osteomyelitis in any of the wounded areas. 10/7 2-week follow-up. The patient has 3 wounds including superficially over the left first metatarsal head,  left first submetatarsal in the lateral foot and a deep area more distally. Her MRI did not show osteomyelitis in reference to the wounds that we are treating although did suggest an early possibility in the left fifth metatarsal head however there is no wound in this area. I have reviewed her inflammatory markers. Most recently on 7/26 she had a sedimentation rate of 117 although this was previously greater than 140 and then 108. Last CRP was on 8/20 at 5.1  comparing to previously at 4.27. Noteworthy that this is when she was in the hospital in July I believe. 10/19; patient missed her appointment last week because of transportation issues. We have been using silver collagen. She still has the area over the metatarsal head and the mid part of her foot which are superficial wounds about the same. Marked deterioration in the problematic distal wound. This now is right down into the fascial layer of her foot. Our intake nurse noted purulent drainage. She has a small satellite lesion towards the heel 10/28; PCR culture of the major wound she has on her proximal plantar foot grew both Peptostreptococcus and Corynebacterium. The Corynebacterium could easily be a skin contaminant but Peptostreptococcus deserve treatment and I started her on Augmentin she arrives in clinic today with the wound looking a lot better 11/5; Finishing her augmentin. Patient's foot is not doing well. Now with a probing area laterally of the major wound going laterally. she presents with a new wound on the lateral part of the left foot with purulent drainage. this tunnels widely and toward the plantar foot. It does not connect with the major plantar wound however I am concerned Objective Constitutional Patient is hypertensive.. Pulse regular and within target range for patient.Marland Kitchen Respirations regular, non-labored and within target range.. Temperature is normal and within the target range for the patient.Marland Kitchen Appears in no distress.  Does not look systemically ill. Vitals Time Taken: 3:42 PM, Height: 67 in, Weight: 200 lbs, BMI: 31.3, Temperature: 99 F, Pulse: 73 bpm, Respiratory Rate: 18 breaths/min, Blood Pressure: 146/74 mmHg. General Notes: Wound exam; 1 New wound lateral left foot with tunneling widely most worrisome toward the plantar foot. Culture 2 more extensive breakdown in the lateral part of the deep plantar wound. 3 superficial wound met head and lateral foot require debridment Integumentary (Hair, Skin) Wound #1 status is Open. Original cause of wound was Blister. The wound is located on the Left,Proximal,Plantar Foot. The wound measures 1cm length x 2.1cm width x 0.3cm depth; 1.649cm^2 area and 0.495cm^3 volume. There is muscle and Fat Layer (Subcutaneous Tissue) exposed. There is no tunneling or undermining noted. There is a medium amount of serosanguineous drainage noted. The wound margin is well defined and not attached to the wound base. There is medium (34-66%) red granulation within the wound bed. There is a medium (34-66%) amount of necrotic tissue within the wound bed including Adherent Slough and Necrosis of Muscle. Wound #2 status is Open. Original cause of wound was Blister. The wound is located on the [place]. The wound measures 1.4cm length x 3cm width x 1.4cm depth; 3.299cm^2 area and 4.618cm^3 volume. There is Fat Layer (Subcutaneous Tissue) exposed. There is no tunneling or undermining noted. There is a medium amount of serosanguineous drainage noted. The wound margin is epibole. There is medium (34-66%) red granulation within the wound bed. There is a medium (34-66%) amount of necrotic tissue within the wound bed including Adherent Slough. Wound #3 status is Open. Original cause of wound was Blister. The wound is located on the Left Metatarsal head first. The wound measures 0.3cm length x 0.4cm width x 0.3cm depth; 0.094cm^2 area and 0.028cm^3 volume. There is Fat Layer  (Subcutaneous Tissue) exposed. There is no tunneling or undermining noted. There is a small amount of serosanguineous drainage noted. The wound margin is well defined and not attached to the wound base. There is medium (34- 66%) red, pink granulation within the wound bed. There is a medium (34-66%) amount  of necrotic tissue within the wound bed. Wound #4 status is Open. Original cause of wound was Gradually Appeared. The wound is located on the Left,Lateral Foot. The wound measures 0.4cm length x 0.4cm width x 1.7cm depth; 0.126cm^2 area and 0.214cm^3 volume. There is Fat Layer (Subcutaneous Tissue) exposed. There is no tunneling or undermining noted. There is a medium amount of serosanguineous drainage noted. There is small (1-33%) red granulation within the wound bed. There is a large (67-100%) amount of necrotic tissue within the wound bed including Adherent Slough. Assessment Active Problems ICD-10 Type 2 diabetes mellitus with foot ulcer Non-pressure chronic ulcer of other part of left foot with necrosis of muscle Acquired absence of right leg below knee Other chronic osteomyelitis, left ankle and foot Procedures Wound #2 Pre-procedure diagnosis of Wound #2 is a Diabetic Wound/Ulcer of the Lower Extremity located on the Left,Distal,Plantar Foot .Severity of Tissue Pre Debridement is: Fat layer exposed. There was a Excisional Skin/Subcutaneous Tissue Debridement with a total area of 4.2 sq cm performed by [name]., MD. With the following instrument(s): Curette to remove Viable and Non-Viable tissue/material. Material removed includes Callus, Subcutaneous Tissue, and Slough after achieving pain control using Other (benzocaine 20% spray). No specimens were taken. A time out was conducted at 16:25, prior to the start of the procedure. A Minimum amount of bleeding was controlled with Pressure. The procedure was tolerated well with a pain level of 0 throughout and a pain level of 0  following the procedure. Post Debridement Measurements: 1.4cm length x 3cm width x 1.4cm depth; 4.618cm^3 volume. Character of Wound/Ulcer Post Debridement requires further debridement. Severity of Tissue Post Debridement is: Fat layer exposed. Post procedure Diagnosis Wound #2: Same as Pre-Procedure Wound #3 Pre-procedure diagnosis of Wound #3 is a Diabetic Wound/Ulcer of the Lower Extremity located on the Left Metatarsal head first .Severity of Tissue Pre Debridement is: Fat layer exposed. There was a Excisional Skin/Subcutaneous Tissue Debridement with a total area of 0.12 sq cm performed by [name]., MD. With the following instrument(s): Curette to remove Viable and Non-Viable tissue/material. Material removed includes Callus, Subcutaneous Tissue, and Slough after achieving pain control using Other (benzocaine 20% spray). No specimens were taken. A time out was conducted at 16:25, prior to the start of the procedure. A Minimum amount of bleeding was controlled with Pressure. The procedure was tolerated well with a pain level of 0 throughout and a pain level of 0 following the procedure. Post Debridement Measurements: 0.3cm length x 0.4cm width x 0.3cm depth; 0.028cm^3 volume. Character of Wound/Ulcer Post Debridement is improved. Severity of Tissue Post Debridement is: Fat layer exposed. Post procedure Diagnosis Wound #3: Same as Pre-Procedure Plan Follow-up Appointments: Return Appointment in 1 week. Dressing Change Frequency: Change dressing three times week. - Twice a week home health. wound center weekly. Wound Cleansing: Clean wound with Wound Cleanser - or normal saline - all wounds Primary Wound Dressing: Wound #1 Left,Proximal,Plantar Foot: Calcium Alginate with Silver - ensure to pack lightly into any depth. Wound #4 Left,Lateral Foot: Calcium Alginate with Silver - ensure to pack lightly into any depth. Wound #2 Left,Distal,Plantar Foot: Calcium Alginate with  Silver Wound #3 Left Metatarsal head first: Calcium Alginate with Silver Secondary Dressing: Kerlix/Rolled Gauze Dry Gauze ABD pad Edema Control: Avoid standing for long periods of time Elevate legs to the level of the heart or above for 30 minutes daily and/or when sitting, a frequency of: - throughout the day. Off-Loading: Open toe surgical shoe  to: - left foot. ensure no pressure to bottom of left foot. use arms to push wheelchair around not your foot. Home Health: [facility] for wound care. - [facility] twice a week home health. wound center weekly. Laboratory ordered were: Anerobic culture - left lateral foot Radiology ordered were: MRI, lower extremity with/without contrast left foot - worsening left foot diabetic foot ulcer left lateral foot 1 Repeat mRI of the foot oabscess. oextending osteo lateral foot 2 C+S of the purulent draninage on augmenting 3 Very worriesome deteroraton and I told the pateint this. 4 will need ID likely psooibly surgery Electronic Signature(s) Signed: 11/12/2019 1:46:42 PM By: Cheryl Ham MD Entered By: [name] on 11/11/2019 14:57:17 -------------------------------------------------------------------------------- SuperBill Details Patient Name: Date of Service: Cheryl [name] 11/09/2019 Medical Record Number: [id number] Patient Account Number: [id number] Date of Birth/Sex: Treating RN: Mar 07, 1965 (54 y.o. 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, left ankle and foot Facility Procedures CPT4 Code:  [id number] Description: 53748 - DEB SUBQ TISSUE 20 SQ CM/< ICD-10 Diagnosis Description L97.523 Non-pressure chronic ulcer of other part of left foot with necrosis of muscle E11.621 Type 2 diabetes mellitus with foot ulcer Modifier: Quantity: 1 Physician Procedures : CPT4 Code Description Modifier [id number] - WC PHYS SUBQ TISS 20 SQ CM ICD-10 Diagnosis Description L97.523 Non-pressure chronic ulcer of other part of left foot with necrosis of muscle E11.621 Type 2 diabetes mellitus with foot ulcer Quantity: 1 Electronic Signature(s) Signed: 11/12/2019 1:46:42 PM By: Cheryl Ham MD Previous Signature: 11/09/2019 6:06:29 PM Version By: Cheryl Gouty RN, [name] Entered By: [name] on 11/11/2019 14:57:35

## 2019-11-13 LAB — AEROBIC CULTURE W GRAM STAIN (SUPERFICIAL SPECIMEN)

## 2019-11-16 ENCOUNTER — Other Ambulatory Visit: Payer: Self-pay

## 2019-11-16 ENCOUNTER — Encounter (HOSPITAL_BASED_OUTPATIENT_CLINIC_OR_DEPARTMENT_OTHER): Payer: Medicare Other | Admitting: Internal Medicine

## 2019-11-16 DIAGNOSIS — E11621 Type 2 diabetes mellitus with foot ulcer: Secondary | ICD-10-CM | POA: Diagnosis not present

## 2019-11-19 ENCOUNTER — Ambulatory Visit (HOSPITAL_COMMUNITY)
Admission: RE | Admit: 2019-11-19 | Discharge: 2019-11-19 | Disposition: A | Discharge status: Home / Self Care | Payer: Medicare Other | Source: Ambulatory Visit | Attending: Internal Medicine | Admitting: Internal Medicine

## 2019-11-19 ENCOUNTER — Other Ambulatory Visit: Payer: Self-pay

## 2019-11-19 DIAGNOSIS — E08621 Diabetes mellitus due to underlying condition with foot ulcer: Secondary | ICD-10-CM | POA: Insufficient documentation

## 2019-11-19 DIAGNOSIS — L97529 Non-pressure chronic ulcer of other part of left foot with unspecified severity: Secondary | ICD-10-CM | POA: Insufficient documentation

## 2019-11-19 MED ORDER — GADOBUTROL 1 MMOL/ML IV SOLN
9.5000 mL | Freq: Once | INTRAVENOUS | Status: AC | PRN
  Administered 2019-11-19: 9.5 mL via INTRAVENOUS

## 2019-11-19 NOTE — Progress Notes (Signed)
[name], [name] ([id number]) Visit Report for 11/16/2019 HPI Details Patient Name: Date of Service: [place] [name], [name] 11/16/2019 3:45 PM Medical Record Number: [id number] Patient Account Number: [id number] Date of Birth/Sex: Treating RN: 01-17-1965 (54 y.o. [name] Primary Care Provider: [name] Other Clinician: Referring Provider: Treating Provider/Extender: Tonette Bihari, [name] in Treatment: 11 History of Present Illness HPI Description: 28ADMISSION 08/30/2019 This is a 54 year old woman with type 2 diabetes and peripheral neuropathy. She developed a blister on the plantar left foot on 7/27. She was seen in the emergency room treated. This did not get any better and she required admission to the hospital from 08/02/2019 through 08/09/2019. She was felt to have cellulitis of the left foot. She was treated with IV medications/antibiotics discharged on Augmentin however she could not tolerate this and she was changed to doxycycline for 5 days she has completed this. She has advanced home care they are apparently putting Xeroform over the wound. When she left the hospital she was supposed to be putting Santyl over the black areas on the left plantar foot covered with Xeroform. X-rays done in the hospital did not show evidence of osteomyelitis only a superficial plantar ulceration. She had blood cultures but I do not see a wound culture. The patient has a past medical history of a right below-knee amputation by Dr. [name] in 2012 presumably for infection in this area although I am not certain. As noted she is a type II diabetic on oral agents with a recent hemoglobin A1c of 5.5. Other past medical history includes ITP, peripheral edema, chronic kidney disease stage III, hypertension, right below-knee amputation, diastolic heart failure. She tells me she is wheelchair-bound and is nonweightbearing although she does push the wheelchair along with the left foot. She  is only using hospital sock on the left foot. ABI in our clinic was 1.06 on the left 53/7; the patient cultured Pseudomonas last week out of the deeper proximal wound. MRI that was ordered showed progressive soft tissue ulceration along the plantar aspect of the foot especially along the medial arch but also plantar to the first metatarsal phalangeal joint no drainable abscess or foreign body. There was a suspicion for early osteomyelitis in the fifth metatarsal base but she does not have a wound in this area. Also problematically is that where we put her prescription for ciprofloxacin and they did not have the antibiotic and we verified that they are out of ciprofloxacin. We did not receive the message from them to this effect The patient did see Dr. [name] of podiatry. The wound was debrided. He prescribed Santyl which I do not think she is picked up either. Lab work including a CBC sedimentation rate and C-reactive protein were ordered. Finally she is using silver alginate she has home health changing the dressing 3 times a week she does not disturb this in between times 9/23; we have not seen this lady in 2-1/2 weeks largely because of transportation issues. She still has 3 wound areas a deep area in the midfoot and area just above this and then an area on the first met head. She has been using silver alginate on the wound. They are trying to use home health however they are trying to teach her how to do this at be been very difficult for this patient to do so. In discussing things with her she uses this foot to propel her wheelchair I have asked her to stop doing this and to use her  arms. She states that she is only moving around in her house. We will need to have a look at her lab work that I quoted from the last time she was here that was ordered by Dr. [name] of podiatry. By our MRI she does not have osteomyelitis in any of the wounded areas. 10/7 2-week follow-up. The patient has 3 wounds  including superficially over the left first metatarsal head, left first submetatarsal in the lateral foot and a deep area more distally. Her MRI did not show osteomyelitis in reference to the wounds that we are treating although did suggest an early possibility in the left fifth metatarsal head however there is no wound in this area. I have reviewed her inflammatory markers. Most recently on 7/26 she had a sedimentation rate of 117 although this was previously greater than 140 and then 108. Last CRP was on 8/20 at 5.1 comparing to previously at 4.27. Noteworthy that this is when she was in the hospital in July I believe. 10/19; patient missed her appointment last week because of transportation issues. We have been using silver collagen. She still has the area over the metatarsal head and the mid part of her foot which are superficial wounds about the same. Marked deterioration in the problematic distal wound. This now is right down into the fascial layer of her foot. Our intake nurse noted purulent drainage. She has a small satellite lesion towards the heel 10/28; PCR culture of the major wound she has on her proximal plantar foot grew both Peptostreptococcus and Corynebacterium. The Corynebacterium could easily be a skin contaminant but Peptostreptococcus deserve treatment and I started her on Augmentin she arrives in clinic today with the wound looking a lot better 11/5; Finishing her augmentin. Patient's foot is not doing well. Now with a probing area laterally of the major wound going laterally. she presents with a new wound on the lateral part of the left foot with purulent drainage. this tunnels widely and toward the plantar foot. It does not connect with the major plantar wound however I am concerned 11/12; Wound on the left lateral foot that had some purulent drainage last week I cultured and that was negative although she was still on Augmentin at that time which may have affected the  culture. Other than that she has the same 3 areas on the plantar part of the left foot including one over the third metatarsal head, a deep area proximally which has always been her largest wound and one in between. The one in the third metatarsal head and one just distal to this of always been superficial where she has a deep wound in the proximal part of the foot. Today the lateral part of the deeper wound is not nearly ass easy to probe this had a considerable distance last week I told the patient that really the approach this is going to depend on the result of the MRI which is on Monday. If she has an underlying abscess and/or osteomyelitis she will need serious consideration of amputation in this patient who is nonambulatory plus or minus foot surgery and IV antibiotics. We really have not made a lot of progress with her original wounds. Electronic Signature(s) Signed: 11/19/2019 9:07:52 AM By: Linton Ham MD Signed: 11/19/2019 9:07:52 AM By: Linton Ham MD Entered By: Linton Ham on 11/18/2019 08:49:24 -------------------------------------------------------------------------------- Physical Exam Details Patient Name: Date of Service: [name], [name] 11/16/2019 3:45 PM Medical Record Number: [id number] Patient Account Number: [id number] Date of Birth/Sex:  Treating RN: 1965/02/04 (54 y.o. [name] Primary Care Provider: [name] Other Clinician: Referring Provider: Treating Provider/Extender: Tonette Bihari, [name] in Treatment: 11 Constitutional Patient is hypertensive.. Pulse regular and within target range for patient.Marland Kitchen Respirations regular, non-labored and within target range.. Temperature is normal and within the target range for the patient.Marland Kitchen Appears in no distress. Cardiovascular pedal pulses are palpable. Notes wound exam; essentially unchanged. She has a dark area over the third metatarsal head, just proximal to this a cleaner  looking wound in the mid foot and then her deep area most proximally towards the calcaneus. The deep area does not have the lateral probing area that she had last week and there is no purulent drainage. The new area last week on the lateral part of the foot is still open but does not probe is deeply. What Electronic Signature(s) Signed: 11/19/2019 9:07:52 AM By: Linton Ham MD Entered By: Linton Ham on 11/18/2019 08:53:31 -------------------------------------------------------------------------------- Physician Orders Details Patient Name: Date of Service: [name], [name] 11/16/2019 3:45 PM Medical Record Number: [id number] Patient Account Number: [id number] Date of Birth/Sex: Treating RN: [birth year] (54 y.o. [name] Primary Care Provider: [name] Other Clinician: Referring Provider: Treating Provider/Extender: Tonette Bihari, [name] in Treatment: 11 Verbal / Phone Orders: No Diagnosis Coding ICD-10 Coding Code Description E11.621 Type 2 diabetes mellitus with foot ulcer L97.523 Non-pressure chronic ulcer of other part of left foot with necrosis of muscle Z89.511 Acquired absence of right leg below knee M86.672 Other chronic osteomyelitis, left ankle and foot Follow-up Appointments ppointment in 1 week. - Wed or Thurs Return A Dressing Change Frequency Change dressing three times week. Wound Cleansing Clean wound with Wound Cleanser - or normal saline - all wounds Primary Wound Dressing Wound #1 Left,Proximal,Plantar Foot lginate with Silver - ensure to pack lightly into any depth. Calcium A Wound #2 Left,Distal,Plantar Foot lginate with Silver - pack into any undermining Calcium A Wound #3 Left Metatarsal head first Calcium Alginate with Silver Wound #4 Left,Lateral Foot lginate with Silver - ensure to pack lightly into any depth. Calcium A Secondary Dressing Kerlix/Rolled Gauze Dry Gauze ABD pad Edema Control Avoid standing  for long periods of time Elevate legs to the level of the heart or above for 30 minutes daily and/or when sitting, a frequency of: - throughout the day. Off-Loading Open toe surgical shoe to: - left foot. ensure no pressure to bottom of left foot. use arms to push wheelchair around not your foot. [place] [facility] for wound care. - [facility] twice a week home health. wound center weekly. Electronic Signature(s) Signed: 11/16/2019 5:58:22 PM By: Baruch Gouty RN, [name] Signed: 11/19/2019 9:07:52 AM By: Linton Ham MD Entered By: Baruch Gouty on 11/16/2019 16:23:15 -------------------------------------------------------------------------------- Problem List Details Patient Name: Date of Service: [name], [name] 11/16/2019 3:45 PM Medical Record Number: [id number] Patient Account Number: [id number] Date of Birth/Sex: Treating RN: Sep 26, 1965 (54 y.o. [name] Primary Care Provider: [name] Other Clinician: Referring Provider: Treating Provider/Extender: Tonette Bihari, [name] in Treatment: 11 Active Problems ICD-10 Encounter Code Description Active Date MDM Diagnosis E11.621 Type 2 diabetes mellitus with foot ulcer 08/30/2019 No Yes L97.523 Non-pressure chronic ulcer of other part of left foot with necrosis of muscle 08/30/2019 No Yes Z89.511 Acquired absence of right leg below knee 08/30/2019 No Yes M86.672 Other chronic osteomyelitis, left ankle and foot 08/30/2019 No Yes Inactive Problems Resolved Problems Electronic Signature(s) Signed: 11/19/2019  9:07:52 AM By: Linton Ham MD Previous Signature: 11/16/2019 5:58:22 PM Version By: Baruch Gouty RN, [name] Entered By: Linton Ham on 11/18/2019 08:42:36 -------------------------------------------------------------------------------- Progress Note Details Patient Name: Date of Service: [name], [name] 11/16/2019 3:45 PM Medical Record  Number: [id number] Patient Account Number: [id number] Date of Birth/Sex: Treating RN: 04/19/1965 (54 y.o. [name] Primary Care Provider: [name] Other Clinician: Referring Provider: Treating Provider/Extender: Tonette Bihari, [name] in Treatment: 11 Subjective History of Present Illness (HPI) 28ADMISSION 08/30/2019 This is a 54 year old woman with type 2 diabetes and peripheral neuropathy. She developed a blister on the plantar left foot on 7/27. She was seen in the emergency room treated. This did not get any better and she required admission to the hospital from 08/02/2019 through 08/09/2019. She was felt to have cellulitis of the left foot. She was treated with IV medications/antibiotics discharged on Augmentin however she could not tolerate this and she was changed to doxycycline for 5 days she has completed this. She has advanced home care they are apparently putting Xeroform over the wound. When she left the hospital she was supposed to be putting Santyl over the black areas on the left plantar foot covered with Xeroform. X-rays done in the hospital did not show evidence of osteomyelitis only a superficial plantar ulceration. She had blood cultures but I do not see a wound culture. The patient has a past medical history of a right below-knee amputation by Dr. [name] in 2012 presumably for infection in this area although I am not certain. As noted she is a type II diabetic on oral agents with a recent hemoglobin A1c of 5.5. Other past medical history includes ITP, peripheral edema, chronic kidney disease stage III, hypertension, right below-knee amputation, diastolic heart failure. She tells me she is wheelchair-bound and is nonweightbearing although she does push the wheelchair along with the left foot. She is only using hospital sock on the left foot. ABI in our clinic was 1.06 on the left 53/7; the patient cultured Pseudomonas last week out of the deeper proximal  wound. MRI that was ordered showed progressive soft tissue ulceration along the plantar aspect of the foot especially along the medial arch but also plantar to the first metatarsal phalangeal joint no drainable abscess or foreign body. There was a suspicion for early osteomyelitis in the fifth metatarsal base but she does not have a wound in this area. Also problematically is that where we put her prescription for ciprofloxacin and they did not have the antibiotic and we verified that they are out of ciprofloxacin. We did not receive the message from them to this effect The patient did see Dr. [name] of podiatry. The wound was debrided. He prescribed Santyl which I do not think she is picked up either. Lab work including a CBC sedimentation rate and C-reactive protein were ordered. Finally she is using silver alginate she has home health changing the dressing 3 times a week she does not disturb this in between times 9/23; we have not seen this lady in 2-1/2 weeks largely because of transportation issues. She still has 3 wound areas a deep area in the midfoot and area just above this and then an area on the first met head. She has been using silver alginate on the wound. They are trying to use home health however they are trying to teach her how to do this at be been very difficult for this patient to do so. In discussing things with her she  uses this foot to propel her wheelchair I have asked her to stop doing this and to use her arms. She states that she is only moving around in her house. We will need to have a look at her lab work that I quoted from the last time she was here that was ordered by Dr. [name] of podiatry. By our MRI she does not have osteomyelitis in any of the wounded areas. 10/7 2-week follow-up. The patient has 3 wounds including superficially over the left first metatarsal head, left first submetatarsal in the lateral foot and a deep area more distally. Her MRI did not show  osteomyelitis in reference to the wounds that we are treating although did suggest an early possibility in the left fifth metatarsal head however there is no wound in this area. I have reviewed her inflammatory markers. Most recently on 7/26 she had a sedimentation rate of 117 although this was previously greater than 140 and then 108. Last CRP was on 8/20 at 5.1 comparing to previously at 4.27. Noteworthy that this is when she was in the hospital in July I believe. 10/19; patient missed her appointment last week because of transportation issues. We have been using silver collagen. She still has the area over the metatarsal head and the mid part of her foot which are superficial wounds about the same. Marked deterioration in the problematic distal wound. This now is right down into the fascial layer of her foot. Our intake nurse noted purulent drainage. She has a small satellite lesion towards the heel 10/28; PCR culture of the major wound she has on her proximal plantar foot grew both Peptostreptococcus and Corynebacterium. The Corynebacterium could easily be a skin contaminant but Peptostreptococcus deserve treatment and I started her on Augmentin she arrives in clinic today with the wound looking a lot better 11/5; Finishing her augmentin. Patient's foot is not doing well. Now with a probing area laterally of the major wound going laterally. she presents with a new wound on the lateral part of the left foot with purulent drainage. this tunnels widely and toward the plantar foot. It does not connect with the major plantar wound however I am concerned 11/12; Wound on the left lateral foot that had some purulent drainage last week I cultured and that was negative although she was still on Augmentin at that time which may have affected the culture. Other than that she has the same 3 areas on the plantar part of the left foot including one over the third metatarsal head, a deep area proximally which  has always been her largest wound and one in between. The one in the third metatarsal head and one just distal to this of always been superficial where she has a deep wound in the proximal part of the foot. Today the lateral part of the deeper wound is not nearly ass easy to probe this had a considerable distance last week I told the patient that really the approach this is going to depend on the result of the MRI which is on Monday. If she has an underlying abscess and/or osteomyelitis she will need serious consideration of amputation in this patient who is nonambulatory plus or minus foot surgery and IV antibiotics. We really have not made a lot of progress with her original wounds. Objective Constitutional Patient is hypertensive.. Pulse regular and within target range for patient.Marland Kitchen Respirations regular, non-labored and within target range.. Temperature is normal and within the target range for the patient.Marland Kitchen Appears in  no distress. Vitals Time Taken: 3:58 PM, Height: 67 in, Weight: 200 lbs, BMI: 31.3, Temperature: 98.6 F, Pulse: 77 bpm, Respiratory Rate: 18 breaths/min, Blood Pressure: 144/81 mmHg. Cardiovascular pedal pulses are palpable. General Notes: wound exam; essentially unchanged. She has a dark area over the third metatarsal head, just proximal to this a cleaner looking wound in the mid foot and then her deep area most proximally towards the calcaneus. The deep area does not have the lateral probing area that she had last week and there is no purulent drainage. The new area last week on the lateral part of the foot is still open but does not probe is deeply. What Integumentary (Hair, Skin) Wound #1 status is Open. Original cause of wound was Blister. The wound is located on the Left,Proximal,Plantar Foot. The wound measures 0.9cm length x 2.6cm width x 1.1cm depth; 1.838cm^2 area and 2.022cm^3 volume. There is muscle and Fat Layer (Subcutaneous Tissue) exposed. There is  undermining starting at 3:00 and ending at 9:00 with a maximum distance of 0.8cm. There is a medium amount of serosanguineous drainage noted. The wound margin is well defined and not attached to the wound base. There is medium (34-66%) pink granulation within the wound bed. There is a medium (34-66%) amount of necrotic tissue within the wound bed including Adherent Slough. Wound #2 status is Open. Original cause of wound was Blister. The wound is located on the [place]. The wound measures 1cm length x 2cm width x 0.5cm depth; 1.571cm^2 area and 0.785cm^3 volume. There is Fat Layer (Subcutaneous Tissue) exposed. There is no tunneling noted, however, there is undermining starting at 2:00 and ending at 4:00 with a maximum distance of 0.5cm. There is a medium amount of serosanguineous drainage noted. The wound margin is epibole. There is medium (34-66%) pink granulation within the wound bed. There is a medium (34-66%) amount of necrotic tissue within the wound bed including Adherent Slough. Wound #3 status is Open. Original cause of wound was Blister. The wound is located on the Left Metatarsal head first. The wound measures 0cm length x 0cm width x 0cm depth; 0cm^2 area and 0cm^3 volume. There is no tunneling or undermining noted. There is a none present amount of drainage noted. The wound margin is well defined and not attached to the wound base. There is no granulation within the wound bed. There is no necrotic tissue within the wound bed. Wound #4 status is Open. Original cause of wound was Gradually Appeared. The wound is located on the Left,Lateral Foot. The wound measures 0.3cm length x 0.3cm width x 1cm depth; 0.071cm^2 area and 0.071cm^3 volume. There is Fat Layer (Subcutaneous Tissue) exposed. There is no tunneling or undermining noted. There is a medium amount of serosanguineous drainage noted. The wound margin is flat and intact. There is large (67-100%) red granulation within  the wound bed. There is no necrotic tissue within the wound bed. Assessment Active Problems ICD-10 Type 2 diabetes mellitus with foot ulcer Non-pressure chronic ulcer of other part of left foot with necrosis of muscle Acquired absence of right leg below knee Other chronic osteomyelitis, left ankle and foot Plan Follow-up Appointments: Return Appointment in 1 week. - Wed or Thurs Dressing Change Frequency: Change dressing three times week. Wound Cleansing: Clean wound with Wound Cleanser - or normal saline - all wounds Primary Wound Dressing: Wound #1 Left,Proximal,Plantar Foot: Calcium Alginate with Silver - ensure to pack lightly into any depth. Wound #2 Left,Distal,Plantar Foot: Calcium Alginate with Silver -  pack into any undermining Wound #3 Left Metatarsal head first: Calcium Alginate with Silver Wound #4 Left,Lateral Foot: Calcium Alginate with Silver - ensure to pack lightly into any depth. Secondary Dressing: Kerlix/Rolled Gauze Dry Gauze ABD pad Edema Control: Avoid standing for long periods of time Elevate legs to the level of the heart or above for 30 minutes daily and/or when sitting, a frequency of: - throughout the day. Off-Loading: Open toe surgical shoe to: - left foot. ensure no pressure to bottom of left foot. use arms to push wheelchair around not your foot. Home Health: [place] [facility] for wound care. - [facility] twice a week home health. wound center weekly. #1 I did not change the primary dressingswhich is silver alginate to all the wound areas #2 I told the patient that is really the MRI that will dictate the next course here. The concern of course is osteomyelitis and/or an abscess in the foot. Her original MRI did not show osteomyelitis in the area where she had wounds however it did suggest the possibility over the base of the fifth metatarsal. At that time she had no wound in this area and it was difficult to be  aggressive in this because the radiologist sounded inconclusive and the patient had no open wound. #3 I think the patient is certainly at risk of amputation. She is already had an amputation on the other side she is nonambulatory however I've only superficially brought this up with her. #4 await the MRI Electronic Signature(s) Signed: 11/19/2019 9:07:52 AM By: Linton Ham MD Entered By: Linton Ham on 11/18/2019 08:55:50 -------------------------------------------------------------------------------- SuperBill Details Patient Name: Date of Service: [name] 11/16/2019 Medical Record Number: [id number] Patient Account Number: [id number] Date of Birth/Sex: Treating RN: 12/15/1965 (55 y.o. 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 M86.672 Other chronic osteomyelitis, left ankle and foot Facility Procedures CPT4 Code: [id number] Description: [phone] - WOUND CARE VISIT-LEV 5 EST PT Modifier: Quantity: 1 Physician Procedures : CPT4 Code Description Modifier [id number] - WC PHYS LEVEL 3 - EST PT ICD-10 Diagnosis Description L97.523 Non-pressure chronic ulcer of other part of left foot with necrosis of muscle E11.621 Type 2 diabetes mellitus with foot ulcer M86.672 Other  chronic osteomyelitis, left ankle and foot Quantity: 1 Electronic Signature(s) Signed: 11/19/2019 9:07:52 AM By: Linton Ham MD Previous Signature: 11/16/2019 5:58:22 PM Version By: Baruch Gouty RN, [name] Entered By: Linton Ham on 11/18/2019 08:56:52

## 2019-11-20 NOTE — Progress Notes (Signed)
[name], [name] ([id number]) Visit Report for 11/16/2019 Arrival Information Details Patient Name: Date of Service: [place] [name], [name] 11/16/2019 3:45 PM Medical Record Number: [id number] Patient Account Number: [id number] Date of Birth/Sex: Treating RN: 1965/05/10 (54 y.o. [name], [name] Primary Care [name]: [name] Other Clinician: Referring Zhana Jeangilles: Treating Jomari Bartnik/Extender: Tonette Bihari, [name] in Treatment: 11 Visit Information History Since Last Visit Added or deleted any medications: No Patient Arrived: Wheel Chair Any new allergies or adverse reactions: No Arrival Time: 15:57 Had a fall or experienced change in No Accompanied By: alone activities of daily living that may affect Transfer Assistance: None risk of falls: Patient Identification Verified: Yes Signs or symptoms of abuse/neglect since last visito No Secondary Verification Process Completed: Yes Hospitalized since last visit: No Patient Requires Transmission-Based Precautions: No Implantable device outside of the clinic excluding No Patient Has Alerts: No cellular tissue based products placed in the center since last visit: Has Dressing in Place as Prescribed: Yes Pain Present Now: No Electronic Signature(s) Signed: 11/19/2019 5:51:53 PM By: Levan Hurst RN, [name] Entered By: Levan Hurst on 11/16/2019 15:58:02 -------------------------------------------------------------------------------- Clinic Level of Care Assessment Details Patient Name: Date of Service: [name], [name] 11/16/2019 3:45 PM Medical Record Number: [id number] Patient Account Number: [id number] Date of Birth/Sex: Treating RN: Apr 29, 1965 (54 y.o. [name] Primary Care Hulen Mandler: [name] Other Clinician: Referring Alfa Leibensperger: Treating Brantly Kalman/Extender: Tonette Bihari, [name] in Treatment: 11 Clinic Level of Care Assessment Items TOOL 4 Quantity Score '[]'  - 0 Use when  only an EandM is performed on FOLLOW-UP visit ASSESSMENTS - Nursing Assessment / Reassessment X- 1 10 Reassessment of Co-morbidities (includes updates in patient status) X- 1 5 Reassessment of Adherence to Treatment Plan ASSESSMENTS - Wound and Skin A ssessment / Reassessment '[]'  - 0 Simple Wound Assessment / Reassessment - one wound X- 4 5 Complex Wound Assessment / Reassessment - multiple wounds '[]'  - 0 Dermatologic / Skin Assessment (not related to wound area) ASSESSMENTS - Focused Assessment '[]'  - 0 Circumferential Edema Measurements - multi extremities '[]'  - 0 Nutritional Assessment / Counseling / Intervention '[]'  - 0 Lower Extremity Assessment (monofilament, tuning fork, pulses) '[]'  - 0 Peripheral Arterial Disease Assessment (using hand held doppler) ASSESSMENTS - Ostomy and/or Continence Assessment and Care '[]'  - 0 Incontinence Assessment and Management '[]'  - 0 Ostomy Care Assessment and Management (repouching, etc.) PROCESS - Coordination of Care X - Simple Patient / Family Education for ongoing care 1 15 '[]'  - 0 Complex (extensive) Patient / Family Education for ongoing care X- 1 10 Staff obtains Programmer, systems, Records, T Results / Process Orders est X- 1 10 Staff telephones HHA, Nursing Homes / Clarify orders / etc '[]'  - 0 Routine Transfer to another Facility (non-emergent condition) '[]'  - 0 Routine Hospital Admission (non-emergent condition) '[]'  - 0 New Admissions / Biomedical engineer / Ordering NPWT Apligraf, etc. , '[]'  - 0 Emergency Hospital Admission (emergent condition) X- 1 10 Simple Discharge Coordination '[]'  - 0 Complex (extensive) Discharge Coordination PROCESS - Special Needs '[]'  - 0 Pediatric / Minor Patient Management '[]'  - 0 Isolation Patient Management '[]'  - 0 Hearing / Language / Visual special needs '[]'  - 0 Assessment of Community assistance (transportation, D/C planning, etc.) '[]'  - 0 Additional assistance / Altered mentation '[]'  - 0 Support  Surface(s) Assessment (bed, cushion, seat, etc.) INTERVENTIONS - Wound Cleansing / Measurement '[]'  - 0 Simple Wound Cleansing - one wound X- 4 5 Complex Wound Cleansing - multiple wounds X-  1 5 Wound Imaging (photographs - any number of wounds) '[]'  - 0 Wound Tracing (instead of photographs) '[]'  - 0 Simple Wound Measurement - one wound X- 4 5 Complex Wound Measurement - multiple wounds INTERVENTIONS - Wound Dressings X - Small Wound Dressing one or multiple wounds 4 10 '[]'  - 0 Medium Wound Dressing one or multiple wounds '[]'  - 0 Large Wound Dressing one or multiple wounds X- 1 5 Application of Medications - topical '[]'  - 0 Application of Medications - injection INTERVENTIONS - Miscellaneous '[]'  - 0 External ear exam '[]'  - 0 Specimen Collection (cultures, biopsies, blood, body fluids, etc.) '[]'  - 0 Specimen(s) / Culture(s) sent or taken to Lab for analysis '[]'  - 0 Patient Transfer (multiple staff / Civil Service fast streamer / Similar devices) '[]'  - 0 Simple Staple / Suture removal (25 or less) '[]'  - 0 Complex Staple / Suture removal (26 or more) '[]'  - 0 Hypo / Hyperglycemic Management (close monitor of Blood Glucose) '[]'  - 0 Ankle / Brachial Index (ABI) - do not check if billed separately X- 1 5 Vital Signs Has the patient been seen at the hospital within the last three years: Yes Total Score: 175 Level Of Care: New/Established - Level 5 Electronic Signature(s) Signed: 11/16/2019 5:58:22 PM By: Baruch Gouty RN, [name] Entered By: Baruch Gouty on 11/16/2019 16:52:14 -------------------------------------------------------------------------------- Multi Wound Chart Details Patient Name: Date of Service: [name] 11/16/2019 3:45 PM Medical Record Number: [id number] Patient Account Number: [id number] Date of Birth/Sex: Treating RN: 12-14-1965 (54 y.o. [name], [name] Primary Care Juwan Vences: [name] Other Clinician: Referring Viraj Liby: Treating Saurav Crumble/Extender: Tonette Bihari, [name] in Treatment: 11 Vital Signs Height(in): 52 Pulse(bpm): 52 Weight(lbs): 200 Blood Pressure(mmHg): 144/81 Body Mass Index(BMI): 31 Temperature(F): 98.6 Respiratory Rate(breaths/min): 18 Photos: [1:No Photos Left, Proximal, Plantar Foot] [2:No Photos Left, Distal, Plantar Foot] [3:No Photos Left Metatarsal head first] Wound Location: [1:Blister] [2:Blister] [3:Blister] Wounding Event: [1:Diabetic Wound/Ulcer of the Lower] [2:Diabetic Wound/Ulcer of the Lower] [3:Diabetic Wound/Ulcer of the Lower] Primary Etiology: [1:Extremity Cataracts, Anemia, Congestive Heart Cataracts, Anemia, Congestive Heart Cataracts, Anemia, Congestive Heart] [2:Extremity] [3:Extremity] Comorbid History: [1:Failure, Hypertension, Type II Diabetes, Osteomyelitis, Neuropathy Diabetes, Osteomyelitis, Neuropathy Diabetes, Osteomyelitis, Neuropathy 08/02/2019] [2:Failure, Hypertension, Type II 08/02/2019] [3:Failure, Hypertension, Type II  08/02/2019] Date Acquired: [1:11] [2:11] [3:11] Weeks of Treatment: [1:Open] [2:Open] [3:Open] Wound Status: [1:0.9x2.6x1.1] [2:1x2x0.5] [3:0x0x0] Measurements L x W x D (cm) [1:1.838] [2:1.571] [3:0] A (cm) : rea [1:2.022] [2:0.785] [3:0] Volume (cm) : [1:58.20%] [2:33.30%] [3:100.00%] % Reduction in A rea: [1:80.80%] [2:66.70%] [3:100.00%] % Reduction in Volume: [1:3] [2:2] Starting Position 1 (o'clock): [1:9] [2:4] Ending Position 1 (o'clock): [1:0.8] [2:0.5] Maximum Distance 1 (cm): [1:Yes] [2:Yes] [3:No] Undermining: [1:Grade 2] [2:Grade 2] [3:Grade 3] Classification: [1:Medium] [2:Medium] [3:None Present] Exudate A mount: [1:Serosanguineous] [2:Serosanguineous] [3:N/A] Exudate Type: [1:red, brown] [2:red, brown] [3:N/A] Exudate Color: [1:Well defined, not attached] [2:Epibole] [3:Well defined, not attached] Wound Margin: [1:Medium (34-66%)] [2:Medium (34-66%)] [3:None Present (0%)] Granulation A mount: [1:Pink] [2:Pink] [3:N/A] Granulation  Quality: [1:Medium (34-66%)] [2:Medium (34-66%)] [3:None Present (0%)] Necrotic A mount: [1:Fat Layer (Subcutaneous Tissue): Yes Fat Layer (Subcutaneous Tissue): Yes Fascia: No] Exposed Structures: [1:Muscle: Yes Fascia: No Tendon: No Joint: No Bone: No None] [2:Fascia: No Tendon: No Muscle: No Joint: No Bone: No None] [3:Fat Layer (Subcutaneous Tissue): No Tendon: No Muscle: No Joint: No Bone: No Large (67-100%)] Wound Number: 4 N/A N/A Photos: No Photos N/A N/A Left, Lateral Foot N/A N/A Wound Location: Gradually Appeared N/A N/A Wounding Event:  Diabetic Wound/Ulcer of the Lower N/A N/A Primary Etiology: Extremity Cataracts, Anemia, Congestive Heart N/A N/A Comorbid History: Failure, Hypertension, Type II Diabetes, Osteomyelitis, Neuropathy 11/05/2019 N/A N/A Date Acquired: 1 N/A N/A Weeks of Treatment: Open N/A N/A Wound Status: 0.3x0.3x1 N/A N/A Measurements L x W x D (cm) 0.071 N/A N/A A (cm) : rea 0.071 N/A N/A Volume (cm) : 43.70% N/A N/A % Reduction in A rea: 66.80% N/A N/A % Reduction in Volume: No N/A N/A Undermining: Grade 2 N/A N/A Classification: Medium N/A N/A Exudate A mount: Serosanguineous N/A N/A Exudate Type: red, brown N/A N/A Exudate Color: Flat and Intact N/A N/A Wound Margin: Large (67-100%) N/A N/A Granulation A mount: Red N/A N/A Granulation Quality: None Present (0%) N/A N/A Necrotic A mount: Fat Layer (Subcutaneous Tissue): Yes N/A N/A Exposed Structures: Fascia: No Tendon: No Muscle: No Joint: No Bone: No None N/A N/A Epithelialization: Treatment Notes Electronic Signature(s) Signed: 11/19/2019 9:07:52 AM By: Linton Ham MD Signed: 11/20/2019 5:21:16 PM By: Baruch Gouty RN, [name] Entered By: Linton Ham on 11/18/2019 08:42:49 -------------------------------------------------------------------------------- Multi-Disciplinary Care Plan Details Patient Name: Date of Service: [name], [name] 11/16/2019 3:45  PM Medical Record Number: [id number] Patient Account Number: [id number] Date of Birth/Sex: Treating RN: 01-15-1965 (54 y.o. [name] Primary Care Shakiara Lukic: [name] Other Clinician: Referring Yuri Fana: Treating Kennie Karapetian/Extender: Tonette Bihari, [name] in Treatment: 11 Active Inactive Abuse / Safety / Falls / Self Care Management Nursing Diagnoses: Potential for falls Potential for injury related to falls Goals: Patient will not experience any injury related to falls Date Initiated: 08/30/2019 Target Resolution Date: 12/07/2019 Goal Status: Active Patient/caregiver will verbalize/demonstrate measures taken to prevent injury and/or falls Date Initiated: 08/30/2019 Date Inactivated: 09/27/2019 Target Resolution Date: 09/28/2019 Goal Status: Met Interventions: Assess Activities of Daily Living upon admission and as needed Assess fall risk on admission and as needed Assess: immobility, friction, shearing, incontinence upon admission and as needed Assess impairment of mobility on admission and as needed per policy Assess personal safety and home safety (as indicated) on admission and as needed Assess self care needs on admission and as needed Provide education on fall prevention Provide education on personal and home safety Notes: Nutrition Nursing Diagnoses: Impaired glucose control: actual or potential Potential for alteratiion in Nutrition/Potential for imbalanced nutrition Goals: Patient/caregiver agrees to and verbalizes understanding of need to use nutritional supplements and/or vitamins as prescribed Date Initiated: 08/30/2019 Date Inactivated: 10/11/2019 Target Resolution Date: 10/05/2019 Goal Status: Met Patient/caregiver will maintain therapeutic glucose control Date Initiated: 08/30/2019 Target Resolution Date: 12/07/2019 Goal Status: Active Interventions: Assess HgA1c results as ordered upon admission and as needed Assess patient nutrition  upon admission and as needed per policy Provide education on elevated blood sugars and impact on wound healing Provide education on nutrition Treatment Activities: Education provided on Nutrition : 08/30/2019 Notes: Wound/Skin Impairment Nursing Diagnoses: Impaired tissue integrity Knowledge deficit related to ulceration/compromised skin integrity Goals: Patient/caregiver will verbalize understanding of skin care regimen Date Initiated: 08/30/2019 Target Resolution Date: 12/07/2019 Goal Status: Active Interventions: Assess patient/caregiver ability to obtain necessary supplies Assess patient/caregiver ability to perform ulcer/skin care regimen upon admission and as needed Assess ulceration(s) every visit Provide education on ulcer and skin care Notes: Electronic Signature(s) Signed: 11/16/2019 5:58:22 PM By: Baruch Gouty RN, [name] Entered By: Baruch Gouty on 11/16/2019 16:20:18 -------------------------------------------------------------------------------- Pain Assessment Details Patient Name: Date of Service: [name] 11/16/2019 3:45 PM Medical Record Number: [id number] Patient Account Number: [id number] Date of Birth/Sex: Treating  RN: [birth year] (54 y.o. Nancy Fetter Primary Care Leiliana Foody: [name] Other Clinician: Referring Alyxis Grippi: Treating Shirle Provencal/Extender: Tonette Bihari, [name] in Treatment: 11 Active Problems Location of Pain Severity and Description of Pain Patient Has Paino No Site Locations Pain Management and Medication Current Pain Management: Electronic Signature(s) Signed: 11/19/2019 5:51:53 PM By: Levan Hurst RN, [name] Entered By: Levan Hurst on 11/16/2019 15:58:24 -------------------------------------------------------------------------------- Patient/Caregiver Education Details Patient Name: Date of Service: [name] 11/12/2021andnbsp3:45 PM Medical Record Number: [id number] Patient Account  Number: [id number] Date of Birth/Gender: Treating RN: 1965-04-01 (54 y.o. [name] Primary Care Physician: [name] Other Clinician: Referring Physician: Treating Physician/Extender: Sonda Primes in Treatment: 11 Education Assessment Education Provided To: Patient Education Topics Provided Infection: Methods: Explain/Verbal Responses: Reinforcements needed, State content correctly Wound/Skin Impairment: Methods: Explain/Verbal Responses: Reinforcements needed, State content correctly Electronic Signature(s) Signed: 11/16/2019 5:58:22 PM By: Baruch Gouty RN, [name] Entered By: Baruch Gouty on 11/16/2019 16:20:43 -------------------------------------------------------------------------------- Wound Assessment Details Patient Name: Date of Service: [name], [name] 11/16/2019 3:45 PM Medical Record Number: [id number] Patient Account Number: [id number] Date of Birth/Sex: Treating RN: 28-Jul-1965 (54 y.o. Nancy Fetter Primary Care Mark Hassey: [name] Other Clinician: Referring Bryceton Hantz: Treating Huzaifa Viney/Extender: Tonette Bihari, [name] in Treatment: 11 Wound Status Wound Number: 1 Primary Diabetic Wound/Ulcer of the Lower Extremity Etiology: Wound Location: Left, Proximal, Plantar Foot Wound Open Wounding Event: Blister Status: Date Acquired: 08/02/2019 Comorbid Cataracts, Anemia, Congestive Heart Failure, Hypertension, Type Weeks Of Treatment: 11 History: II Diabetes, Osteomyelitis, Neuropathy Clustered Wound: No Photos Photo Uploaded By: Mikeal Hawthorne on 11/20/2019 14:04:36 Wound Measurements Length: (cm) 0.9 Width: (cm) 2.6 Depth: (cm) 1.1 Area: (cm) 1.838 Volume: (cm) 2.022 % Reduction in Area: 58.2% % Reduction in Volume: 80.8% Epithelialization: None Undermining: Yes Starting Position (o'clock): 3 Ending Position (o'clock): 9 Maximum Distance: (cm) 0.8 Wound Description Classification:  Grade 2 Wound Margin: Well defined, not attached Exudate Amount: Medium Exudate Type: Serosanguineous Exudate Color: red, brown Foul Odor After Cleansing: No Slough/Fibrino Yes Wound Bed Granulation Amount: Medium (34-66%) Exposed Structure Granulation Quality: Pink Fascia Exposed: No Necrotic Amount: Medium (34-66%) Fat Layer (Subcutaneous Tissue) Exposed: Yes Necrotic Quality: Adherent Slough Tendon Exposed: No Muscle Exposed: Yes Necrosis of Muscle: No Joint Exposed: No Bone Exposed: No Electronic Signature(s) Signed: 11/19/2019 5:51:53 PM By: Levan Hurst RN, [name] Entered By: Levan Hurst on 11/16/2019 16:14:05 -------------------------------------------------------------------------------- Wound Assessment Details Patient Name: Date of Service: [name], [name] 11/16/2019 3:45 PM Medical Record Number: [id number] Patient Account Number: [id number] Date of Birth/Sex: Treating RN: 11/24/1965 (54 y.o. Nancy Fetter Primary Care Ellysa Parrack: [name] Other Clinician: Referring Scout Guyett: Treating Erhardt Dada/Extender: Tonette Bihari, [name] in Treatment: 11 Wound Status Wound Number: 2 Primary Diabetic Wound/Ulcer of the Lower Extremity Etiology: Wound Location: Left, Distal, Plantar Foot Wound Open Wounding Event: Blister Status: Date Acquired: 08/02/2019 Comorbid Cataracts, Anemia, Congestive Heart Failure, Hypertension, Type Weeks Of Treatment: 11 History: II Diabetes, Osteomyelitis, Neuropathy Clustered Wound: No Photos Photo Uploaded By: Mikeal Hawthorne on 11/20/2019 14:05:02 Wound Measurements Length: (cm) 1 Width: (cm) 2 Depth: (cm) 0.5 Area: (cm) 1.571 Volume: (cm) 0.785 % Reduction in Area: 33.3% % Reduction in Volume: 66.7% Epithelialization: None Tunneling: No Undermining: Yes Starting Position (o'clock): 2 Ending Position (o'clock): 4 Maximum Distance: (cm) 0.5 Wound Description Classification: Grade 2 Wound Margin:  Epibole Exudate Amount: Medium Exudate Type: Serosanguineous Exudate Color: red, brown Foul Odor After Cleansing: No Slough/Fibrino Yes Wound Bed Granulation Amount: Medium (34-66%)  Exposed Structure Granulation Quality: Pink Fascia Exposed: No Necrotic Amount: Medium (34-66%) Fat Layer (Subcutaneous Tissue) Exposed: Yes Necrotic Quality: Adherent Slough Tendon Exposed: No Muscle Exposed: No Joint Exposed: No Bone Exposed: No Electronic Signature(s) Signed: 11/19/2019 5:51:53 PM By: Levan Hurst RN, [name] Entered By: Levan Hurst on 11/16/2019 16:14:34 -------------------------------------------------------------------------------- Wound Assessment Details Patient Name: Date of Service: [name], [name] 11/16/2019 3:45 PM Medical Record Number: [id number] Patient Account Number: [id number] Date of Birth/Sex: Treating RN: 01/13/1965 (54 y.o. Nancy Fetter Primary Care Genia Perin: [name] Other Clinician: Referring Audrey Thull: Treating Deanndra Kirley/Extender: Tonette Bihari, [name] in Treatment: 11 Wound Status Wound Number: 3 Primary Diabetic Wound/Ulcer of the Lower Extremity Etiology: Wound Location: Left Metatarsal head first Wound Open Wounding Event: Blister Status: Date Acquired: 08/02/2019 Comorbid Cataracts, Anemia, Congestive Heart Failure, Hypertension, Type Weeks Of Treatment: 11 History: II Diabetes, Osteomyelitis, Neuropathy Clustered Wound: No Photos Photo Uploaded By: Mikeal Hawthorne on 11/20/2019 14:05:33 Wound Measurements Length: (cm) Width: (cm) Depth: (cm) Area: (cm) Volume: (cm) 0 % Reduction in Area: 100% 0 % Reduction in Volume: 100% 0 Epithelialization: Large (67-100%) 0 Tunneling: No 0 Undermining: No Wound Description Classification: Grade 3 Wound Margin: Well defined, not attached Exudate Amount: None Present Foul Odor After Cleansing: No Slough/Fibrino No Wound Bed Granulation Amount: None Present (0%)  Exposed Structure Necrotic Amount: None Present (0%) Fascia Exposed: No Fat Layer (Subcutaneous Tissue) Exposed: No Tendon Exposed: No Muscle Exposed: No Joint Exposed: No Bone Exposed: No Electronic Signature(s) Signed: 11/19/2019 5:51:53 PM By: Levan Hurst RN, [name] Entered By: Levan Hurst on 11/16/2019 16:14:53 -------------------------------------------------------------------------------- Wound Assessment Details Patient Name: Date of Service: [name], [name] 11/16/2019 3:45 PM Medical Record Number: [id number] Patient Account Number: [id number] Date of Birth/Sex: Treating RN: [birth year] (54 y.o. Nancy Fetter Primary Care Jareli Highland: [name] Other Clinician: Referring Bedie Dominey: Treating Terrin Meddaugh/Extender: Tonette Bihari, [name] in Treatment: 11 Wound Status Wound Number: 4 Primary Diabetic Wound/Ulcer of the Lower Extremity Etiology: Wound Location: Left, Lateral Foot Wound Open Wounding Event: Gradually Appeared Status: Date Acquired: 11/05/2019 Comorbid Cataracts, Anemia, Congestive Heart Failure, Hypertension, Type Weeks Of Treatment: 1 History: II Diabetes, Osteomyelitis, Neuropathy Clustered Wound: No Photos Photo Uploaded By: Mikeal Hawthorne on 11/20/2019 14:05:35 Wound Measurements Length: (cm) 0.3 Width: (cm) 0.3 Depth: (cm) 1 Area: (cm) 0.071 Volume: (cm) 0.071 % Reduction in Area: 43.7% % Reduction in Volume: 66.8% Epithelialization: None Tunneling: No Undermining: No Wound Description Classification: Grade 2 Wound Margin: Flat and Intact Exudate Amount: Medium Exudate Type: Serosanguineous Exudate Color: red, brown Foul Odor After Cleansing: No Slough/Fibrino Yes Wound Bed Granulation Amount: Large (67-100%) Exposed Structure Granulation Quality: Red Fascia Exposed: No Necrotic Amount: None Present (0%) Fat Layer (Subcutaneous Tissue) Exposed: Yes Tendon Exposed: No Muscle Exposed: No Joint Exposed:  No Bone Exposed: No Electronic Signature(s) Signed: 11/19/2019 5:51:53 PM By: Levan Hurst RN, [name] Entered By: Levan Hurst on 11/16/2019 16:13:29 -------------------------------------------------------------------------------- Vitals Details Patient Name: Date of Service: [name] 11/16/2019 3:45 PM Medical Record Number: [id number] Patient Account Number: [id number] Date of Birth/Sex: Treating RN: 01/13/1965 (54 y.o. Nancy Fetter Primary Care Santanna Olenik: [name] Other Clinician: Referring Aerionna Moravek: Treating Latara Micheli/Extender: Tonette Bihari, [name] in Treatment: 11 Vital Signs Time Taken: 15:58 Temperature (F): 98.6 Height (in): 67 Pulse (bpm): 77 Weight (lbs): 200 Respiratory Rate (breaths/min): 18 Body Mass Index (BMI): 31.3 Blood Pressure (mmHg): 144/81 Reference Range: 80 - 120 mg / dl Electronic Signature(s) Signed: 11/19/2019 5:51:53 PM By: Levan Hurst  RN, [name] Entered By: Levan Hurst on 11/16/2019 15:58:19

## 2019-11-22 ENCOUNTER — Encounter (HOSPITAL_BASED_OUTPATIENT_CLINIC_OR_DEPARTMENT_OTHER): Payer: Medicare Other | Admitting: Internal Medicine

## 2019-11-28 ENCOUNTER — Other Ambulatory Visit: Payer: Self-pay | Admitting: Internal Medicine

## 2019-12-03 ENCOUNTER — Other Ambulatory Visit: Payer: Self-pay | Admitting: Internal Medicine

## 2019-12-05 ENCOUNTER — Telehealth (INDEPENDENT_AMBULATORY_CARE_PROVIDER_SITE_OTHER): Payer: Medicare Other | Admitting: Nurse Practitioner

## 2019-12-05 DIAGNOSIS — E11621 Type 2 diabetes mellitus with foot ulcer: Secondary | ICD-10-CM

## 2019-12-05 DIAGNOSIS — G629 Polyneuropathy, unspecified: Secondary | ICD-10-CM

## 2019-12-05 DIAGNOSIS — L97422 Non-pressure chronic ulcer of left heel and midfoot with fat layer exposed: Secondary | ICD-10-CM

## 2019-12-05 MED ORDER — GABAPENTIN 400 MG PO CAPS: 400.0000 mg | ORAL_CAPSULE | Freq: Two times a day (BID) | ORAL | 0 refills | Status: DC

## 2019-12-05 NOTE — Progress Notes (Signed)
   [facility] Patient [facility] [address] [place], [place]  [address] Phone:  [phone]   Fax:  [phone]  Virtual Visit via Telephone Note  I connected with [name] on 12/10/19 at  3:20 PM EST by telephone and verified that I am speaking with the correct person using two identifiers.   I discussed the limitations, risks, security and privacy concerns of performing an evaluation and management service by telephone and the availability of in person appointments. I also discussed with the patient that there may be a patient responsible charge related to this service. The patient expressed understanding and agreed to proceed.  Patient home Provider Office  History of Present Illness: Subjective:    [name] is a 54 y.o. female who presents with left foot pain. Onset of the symptoms was several months ago. Precipitating event: none known. Current symptoms include: redness and swelling and open wound   Symptoms have gradually worsened. Patient has had prior foot problems. Evaluation to date: MRI: abnormal tisse ulceration . Treatment to date: treated weekly but the wound center.   She has been on gabapentin in the past however unable to currently give refills  Observations/Objective: No exam televist  Assessment and Plan: Assessment  Primary Diagnosis & Pertinent Problem List: The primary encounter diagnosis was Diabetic ulcer of left midfoot associated with type 2 diabetes mellitus, with fat layer exposed (HCC). A diagnosis of Neuropathy was also pertinent to this visit.  Visit Diagnosis: 1. Diabetic ulcer of left midfoot associated with type 2 diabetes mellitus, with fat layer exposed (HCC)  Continue with wound therapy as directed   2. Neuropathy  Gabapentin refilled    Follow Up Instructions:    I discussed the assessment and treatment plan with the patient. The patient was provided an opportunity to ask questions and all were answered. The patient  agreed with the plan and demonstrated an understanding of the instructions.   The patient was advised to call back or seek an in-person evaluation if the symptoms worsen or if the condition fails to improve as anticipated.  I provided 6 minutes of non-face-to-face time during this encounter.   Barbette Merino, NP

## 2019-12-07 ENCOUNTER — Other Ambulatory Visit: Payer: Self-pay

## 2019-12-07 ENCOUNTER — Encounter (HOSPITAL_BASED_OUTPATIENT_CLINIC_OR_DEPARTMENT_OTHER): Payer: Medicare Other | Attending: Internal Medicine | Admitting: Internal Medicine

## 2019-12-07 DIAGNOSIS — Z89511 Acquired absence of right leg below knee: Secondary | ICD-10-CM | POA: Diagnosis not present

## 2019-12-07 DIAGNOSIS — E1142 Type 2 diabetes mellitus with diabetic polyneuropathy: Secondary | ICD-10-CM | POA: Diagnosis not present

## 2019-12-07 DIAGNOSIS — Z993 Dependence on wheelchair: Secondary | ICD-10-CM | POA: Diagnosis not present

## 2019-12-07 DIAGNOSIS — D693 Immune thrombocytopenic purpura: Secondary | ICD-10-CM | POA: Diagnosis not present

## 2019-12-07 DIAGNOSIS — I130 Hypertensive heart and chronic kidney disease with heart failure and stage 1 through stage 4 chronic kidney disease, or unspecified chronic kidney disease: Secondary | ICD-10-CM | POA: Insufficient documentation

## 2019-12-07 DIAGNOSIS — M86672 Other chronic osteomyelitis, left ankle and foot: Secondary | ICD-10-CM | POA: Diagnosis not present

## 2019-12-07 DIAGNOSIS — I5032 Chronic diastolic (congestive) heart failure: Secondary | ICD-10-CM | POA: Insufficient documentation

## 2019-12-07 DIAGNOSIS — E1122 Type 2 diabetes mellitus with diabetic chronic kidney disease: Secondary | ICD-10-CM | POA: Insufficient documentation

## 2019-12-07 DIAGNOSIS — L97523 Non-pressure chronic ulcer of other part of left foot with necrosis of muscle: Secondary | ICD-10-CM | POA: Diagnosis not present

## 2019-12-07 DIAGNOSIS — E11621 Type 2 diabetes mellitus with foot ulcer: Secondary | ICD-10-CM | POA: Insufficient documentation

## 2019-12-07 DIAGNOSIS — E1169 Type 2 diabetes mellitus with other specified complication: Secondary | ICD-10-CM | POA: Diagnosis not present

## 2019-12-07 DIAGNOSIS — N183 Chronic kidney disease, stage 3 unspecified: Secondary | ICD-10-CM | POA: Insufficient documentation

## 2019-12-07 NOTE — Progress Notes (Signed)
[name], [name] ([id number]) Visit Report for 12/07/2019 Debridement Details Patient Name: Date of Service: [name], [name] 12/07/2019 3:30 PM Medical Record Number: [id number] Patient Account Number: [id number] Date of Birth/Sex: Treating RN: 06-Nov-1965 (54 y.o. [name] Primary Care Provider: [name] Other Clinician: Referring Provider: Treating Provider/Extender: Tonette Bihari, [name] in Treatment: 14 Debridement Performed for Assessment: Wound #1 Left,Proximal,Plantar Foot Performed By: Physician Ricard Dillon., MD Debridement Type: Debridement Severity of Tissue Pre Debridement: Fat layer exposed Level of Consciousness (Pre-procedure): Awake and Alert Pre-procedure Verification/Time Out Yes - 16:00 Taken: Start Time: 16:03 Pain Control: Other : benzocaine 20% spray T Area Debrided (L x W): otal 0.7 (cm) x 1.8 (cm) = 1.26 (cm) Tissue and other material debrided: Viable, Non-Viable, Callus, Slough, Subcutaneous, Skin: Epidermis, Slough Level: Skin/Subcutaneous Tissue Debridement Description: Excisional Instrument: Curette Bleeding: Minimum Hemostasis Achieved: Pressure End Time: 16:06 Procedural Pain: 0 Post Procedural Pain: 0 Response to Treatment: Procedure was tolerated well Level of Consciousness (Post- Awake and Alert procedure): Post Debridement Measurements of Total Wound Length: (cm) 0.7 Width: (cm) 1.8 Depth: (cm) 0.4 Volume: (cm) 0.396 Character of Wound/Ulcer Post Debridement: Improved Severity of Tissue Post Debridement: Fat layer exposed Post Procedure Diagnosis Same as Pre-procedure Electronic Signature(s) Signed: 12/07/2019 5:01:53 PM By: Baruch Gouty RN, [name] Signed: 12/07/2019 5:12:12 PM By: Linton Ham MD Entered By: Linton Ham on 12/07/2019 16:43:35 -------------------------------------------------------------------------------- Debridement Details Patient Name: Date of Service: [name], [name] 12/07/2019 3:30 PM Medical Record Number: [id number] Patient Account Number: [id number] Date of Birth/Sex: Treating RN: 01-08-1965 (54 y.o. [name] Primary Care Provider: [name] Other Clinician: Referring Provider: Treating Provider/Extender: Tonette Bihari, [name] in Treatment: 14 Debridement Performed for Assessment: Wound #3 Left Metatarsal head first Performed By: Physician Ricard Dillon., MD Debridement Type: Debridement Severity of Tissue Pre Debridement: Fat layer exposed Level of Consciousness (Pre-procedure): Awake and Alert Pre-procedure Verification/Time Out Yes - 16:00 Taken: Start Time: 16:03 Pain Control: Other : benzocaine 20% spray T Area Debrided (L x W): otal 1 (cm) x 1 (cm) = 1 (cm) Tissue and other material debrided: Viable, Non-Viable, Callus, Skin: Epidermis Level: Skin/Epidermis Debridement Description: Selective/Open Wound Instrument: Curette Bleeding: Minimum Hemostasis Achieved: Pressure End Time: 16:06 Procedural Pain: 0 Post Procedural Pain: 0 Response to Treatment: Procedure was tolerated well Level of Consciousness (Post- Awake and Alert procedure): Post Debridement Measurements of Total Wound Length: (cm) 0.3 Width: (cm) 0.4 Depth: (cm) 0.1 Volume: (cm) 0.009 Character of Wound/Ulcer Post Debridement: Improved Severity of Tissue Post Debridement: Fat layer exposed Post Procedure Diagnosis Same as Pre-procedure Electronic Signature(s) Signed: 12/07/2019 5:01:53 PM By: Baruch Gouty RN, [name] Signed: 12/07/2019 5:12:12 PM By: Linton Ham MD Entered By: Linton Ham on 12/07/2019 16:43:44 -------------------------------------------------------------------------------- HPI Details Patient Name: Date of Service: [name], [name] 12/07/2019 3:30 PM Medical Record Number: [id number] Patient Account Number: [id number] Date of Birth/Sex: Treating RN: Jun 07, 1965 (54 y.o. [name] Primary Care Provider: [name] Other Clinician: Referring Provider: Treating Provider/Extender: Tonette Bihari, [name] in Treatment: 14 History of Present Illness HPI Description: 28ADMISSION 08/30/2019 This is a 54 year old woman with type 2 diabetes and peripheral neuropathy. She developed a blister on the plantar left foot on 7/27. She was seen in the emergency room treated. This did not get any better and she required admission to the hospital from 08/02/2019 through 08/09/2019. She was felt to have cellulitis of the left foot. She was treated with IV medications/antibiotics discharged  on Augmentin however she could not tolerate this and she was changed to doxycycline for 5 days she has completed this. She has advanced home care they are apparently putting Xeroform over the wound. When she left the hospital she was supposed to be putting Santyl over the black areas on the left plantar foot covered with Xeroform. X-rays done in the hospital did not show evidence of osteomyelitis only a superficial plantar ulceration. She had blood cultures but I do not see a wound culture. The patient has a past medical history of a right below-knee amputation by Dr. [name] in 2012 presumably for infection in this area although I am not certain. As noted she is a type II diabetic on oral agents with a recent hemoglobin A1c of 5.5. Other past medical history includes ITP, peripheral edema, chronic kidney disease stage III, hypertension, right below-knee amputation, diastolic heart failure. She tells me she is wheelchair-bound and is nonweightbearing although she does push the wheelchair along with the left foot. She is only using hospital sock on the left foot. ABI in our clinic was 1.06 on the left 9/7; the patient cultured Pseudomonas last week out of the deeper proximal wound. MRI that was ordered showed progressive soft tissue ulceration along the plantar aspect of the foot especially  along the medial arch but also plantar to the first metatarsal phalangeal joint no drainable abscess or foreign body. There was a suspicion for early osteomyelitis in the fifth metatarsal base but she does not have a wound in this area. Also problematically is that where we put her prescription for ciprofloxacin and they did not have the antibiotic and we verified that they are out of ciprofloxacin. We did not receive the message from them to this effect The patient did see Dr. [name] of podiatry. The wound was debrided. He prescribed Santyl which I do not think she is picked up either. Lab work including a CBC sedimentation rate and C-reactive protein were ordered. Finally she is using silver alginate she has home health changing the dressing 3 times a week she does not disturb this in between times 9/23; we have not seen this lady in 2-1/2 weeks largely because of transportation issues. She still has 3 wound areas a deep area in the midfoot and area just above this and then an area on the first met head. She has been using silver alginate on the wound. They are trying to use home health however they are trying to teach her how to do this at be been very difficult for this patient to do so. In discussing things with her she uses this foot to propel her wheelchair I have asked her to stop doing this and to use her arms. She states that she is only moving around in her house. We will need to have a look at her lab work that I quoted from the last time she was here that was ordered by Dr. [name] of podiatry. By our MRI she does not have osteomyelitis in any of the wounded areas. 10/7 2-week follow-up. The patient has 3 wounds including superficially over the left first metatarsal head, left first submetatarsal in the lateral foot and a deep area more distally. Her MRI did not show osteomyelitis in reference to the wounds that we are treating although did suggest an early possibility in the left  fifth metatarsal head however there is no wound in this area. I have reviewed her inflammatory markers. Most recently on 7/26 she had a  sedimentation rate of 117 although this was previously greater than 140 and then 108. Last CRP was on 8/20 at 5.1 comparing to previously at 4.27. Noteworthy that this is when she was in the hospital in July I believe. 10/19; patient missed her appointment last week because of transportation issues. We have been using silver collagen. She still has the area over the metatarsal head and the mid part of her foot which are superficial wounds about the same. Marked deterioration in the problematic distal wound. This now is right down into the fascial layer of her foot. Our intake nurse noted purulent drainage. She has a small satellite lesion towards the heel 10/28; PCR culture of the major wound she has on her proximal plantar foot grew both Peptostreptococcus and Corynebacterium. The Corynebacterium could easily be a skin contaminant but Peptostreptococcus deserve treatment and I started her on Augmentin she arrives in clinic today with the wound looking a lot better 11/5; Finishing her augmentin. Patient's foot is not doing well. Now with a probing area laterally of the major wound going laterally. she presents with a new wound on the lateral part of the left foot with purulent drainage. this tunnels widely and toward the plantar foot. It does not connect with the major plantar wound however I am concerned 11/12; Wound on the left lateral foot that had some purulent drainage last week I cultured and that was negative although she was still on Augmentin at that time which may have affected the culture. Other than that she has the same 3 areas on the plantar part of the left foot including one over the third metatarsal head, a deep area proximally which has always been her largest wound and one in between. The one in the third metatarsal head and one just distal to this  of always been superficial where she has a deep wound in the proximal part of the foot. Today the lateral part of the deeper wound is not nearly ass easy to probe this had a considerable distance last week I told the patient that really the approach this is going to depend on the result of the MRI which is on Monday. If she has an underlying abscess and/or osteomyelitis she will need serious consideration of amputation in this patient who is nonambulatory plus or minus foot surgery and IV antibiotics. We really have not made a lot of progress with her original wounds. 12/3; surprisingly the patient's MRI did not show osteomyelitis but an extensive cellulitis soft tissue infection. With some difficulty 10 days ago we managed to get her on Levaquin and Flagyl. The patient arrives back in clinic today with her 3 original wounds. The area on the lateral foot has closed. Electronic Signature(s) Signed: 12/07/2019 5:12:12 PM By: Linton Ham MD Entered By: Linton Ham on 12/07/2019 16:44:52 -------------------------------------------------------------------------------- Physical Exam Details Patient Name: Date of Service: [name], [name] 12/07/2019 3:30 PM Medical Record Number: [id number] Patient Account Number: [id number] Date of Birth/Sex: Treating RN: 04-25-1965 (54 y.o. [name] Primary Care Provider: [name] Other Clinician: Referring Provider: Treating Provider/Extender: Tonette Bihari, [name] in Treatment: 14 Constitutional Patient is hypertensive.. Pulse regular and within target range for patient.Marland Kitchen Respirations regular, non-labored and within target range.Marland Kitchen Appears in no distress. Cardiovascular Pedal pulses are palpable. Notes Wound exam The superior area over her matted third metatarsal head had thick eschar over the top which I removed there is still a small open area here but it looks clean The wound  in the more proximal foot looked a lot  better. Callus and debris around the edges which I removed with a #5 curette and fibrinous debris over the wound surface The major wound on the proximal foot has much less tunneling laterally. No palpable bone. Necrotic debris on the surface removed with a #5 curette The area on the lateral foot and the tunneling has all close down Electronic Signature(s) Signed: 12/07/2019 5:12:12 PM By: Linton Ham MD Entered By: Linton Ham on 12/07/2019 16:46:57 -------------------------------------------------------------------------------- Physician Orders Details Patient Name: Date of Service: [name], [name] 12/07/2019 3:30 PM Medical Record Number: [id number] Patient Account Number: [id number] Date of Birth/Sex: Treating RN: 1965/04/27 (54 y.o. [name], Linda Primary Care Provider: [name] Other Clinician: Referring Provider: Treating Provider/Extender: Tonette Bihari, [name] in Treatment: [phone] Verbal / Phone Orders: No Diagnosis Coding ICD-10 Coding Code Description E11.621 Type 2 diabetes mellitus with foot ulcer L97.523 Non-pressure chronic ulcer of other part of left foot with necrosis of muscle Z89.511 Acquired absence of right leg below knee M86.672 Other chronic osteomyelitis, left ankle and foot Follow-up Appointments Return Appointment in 2 weeks. Bathing/ Shower/ Hygiene May shower with protection but do not get wound dressing(s) wet. Edema Control - Lymphedema / SCD / Other Left Lower Extremity Elevate legs to the level of the heart or above for 30 minutes daily and/or when sitting, a frequency of: Off-Loading Other: - minimal weight bearing left foot, try not to navigate wheelchair with left foot Home Health No change in wound care orders this week; continue Home Health for wound care. May utilize formulary equivalent dressing for wound treatment orders unless otherwise specified. Other Home Health Orders/Instructions: - [facility] Wound Treatment Wound #1 - Foot Wound Laterality: Plantar, Left, Proximal Peri-Wound Care: Sween Lotion (Moisturizing lotion) (Home Health) 3 x Per Week/30 Days Discharge Instructions: Apply moisturizing lotion or equivalent moisterizing lotion to foot and leg with dressing changes Prim Dressing: KerraCel Ag Gelling Fiber Dressing, 4x5 in (silver alginate) ([place]) 3 x Per Week/30 Days ary Discharge Instructions: Apply silver alginate to wound bed, pack lightly into any depth or undermining Secondary Dressing: Woven Gauze Sponge, Non-Sterile 4x4 in (Home Health) 3 x Per Week/30 Days Discharge Instructions: Apply over primary dressing as directed. Secondary Dressing: ABD Pad, 5x9 (Home Health) 3 x Per Week/30 Days Discharge Instructions: Apply over primary dressing as directed. Secured With: [employer], 4.5x3.1 (in/yd) (Home Health) 3 x Per Week/30 Days Discharge Instructions: Secure with Kerlix as directed. Wound #2 - Foot Wound Laterality: Plantar, Left, Distal Peri-Wound Care: Sween Lotion (Moisturizing lotion) (Home Health) 3 x Per Week/30 Days Discharge Instructions: Apply moisturizing lotion or equivalent moisterizing lotion to foot and leg with dressing changes Prim Dressing: KerraCel Ag Gelling Fiber Dressing, 4x5 in (silver alginate) (Home Health) 3 x Per Week/30 Days ary Discharge Instructions: Apply silver alginate to wound bed, pack lightly into any depth or undermining Secondary Dressing: Woven Gauze Sponge, Non-Sterile 4x4 in (Home Health) 3 x Per Week/30 Days Discharge Instructions: Apply over primary dressing as directed. Secondary Dressing: ABD Pad, 5x9 (Home Health) 3 x Per Week/30 Days Discharge Instructions: Apply over primary dressing as directed. Secured With: [employer], 4.5x3.1 (in/yd) (Home Health) 3 x Per Week/30 Days Discharge Instructions: Secure with Kerlix as directed. Wound #3 - Metatarsal head first Wound Laterality:  Left Peri-Wound Care: Sween Lotion (Moisturizing lotion) (Home Health) 3 x Per Week/30 Days Discharge Instructions: Apply moisturizing lotion or equivalent moisterizing lotion to  foot and leg with dressing changes Prim Dressing: KerraCel Ag Gelling Fiber Dressing, 4x5 in (silver alginate) (Home Health) 3 x Per Week/30 Days ary Discharge Instructions: Apply silver alginate to wound bed, pack lightly into any depth or undermining Secondary Dressing: Woven Gauze Sponge, Non-Sterile 4x4 in (Home Health) 3 x Per Week/30 Days Discharge Instructions: Apply over primary dressing as directed. Secondary Dressing: ABD Pad, 5x9 (Home Health) 3 x Per Week/30 Days Discharge Instructions: Apply over primary dressing as directed. Secured With: [employer], 4.5x3.1 (in/yd) (Home Health) 3 x Per Week/30 Days Discharge Instructions: Secure with Kerlix as directed. Patient Medications llergies: No Known Allergies A Notifications Medication Indication Start End prior to debridement 12/07/2019 benzocaine DOSE topical 20 % aerosol - aerosol topical diabetic wound 12/07/2019 Levaquin infection DOSE oral 500 mg tablet - tablet oral qd for 2 additional weeks Electronic Signature(s) Signed: 12/07/2019 4:53:02 PM By: Linton Ham MD Entered By: Linton Ham on 12/07/2019 16:53:01 -------------------------------------------------------------------------------- Problem List Details Patient Name: Date of Service: [name]. 12/07/2019 3:30 PM Medical Record Number: [id number] Patient Account Number: [id number] Date of Birth/Sex: Treating RN: 02/13/1965 (55 y.o. [name] Primary Care Provider: [name] Other Clinician: Referring Provider: Treating Provider/Extender: Tonette Bihari, [name] in Treatment: 14 Active Problems ICD-10 Encounter Code Description Active Date MDM Diagnosis E11.621 Type 2 diabetes mellitus with foot ulcer 08/30/2019 No Yes L97.523  Non-pressure chronic ulcer of other part of left foot with necrosis of muscle 08/30/2019 No Yes Z89.511 Acquired absence of right leg below knee 08/30/2019 No Yes M86.672 Other chronic osteomyelitis, left ankle and foot 08/30/2019 No Yes Inactive Problems Resolved Problems Electronic Signature(s) Signed: 12/07/2019 5:12:12 PM By: Linton Ham MD Entered By: Linton Ham on 12/07/2019 16:43:13 -------------------------------------------------------------------------------- Progress Note Details Patient Name: Date of Service: [name] 12/07/2019 3:30 PM Medical Record Number: [id number] Patient Account Number: [id number] Date of Birth/Sex: Treating RN: October 27, 1965 (54 y.o. [name] Primary Care Provider: [name] Other Clinician: Referring Provider: Treating Provider/Extender: Tonette Bihari, [name] in Treatment: 14 Subjective History of Present Illness (HPI) 28ADMISSION 08/30/2019 This is a 54 year old woman with type 2 diabetes and peripheral neuropathy. She developed a blister on the plantar left foot on 7/27. She was seen in the emergency room treated. This did not get any better and she required admission to the hospital from 08/02/2019 through 08/09/2019. She was felt to have cellulitis of the left foot. She was treated with IV medications/antibiotics discharged on Augmentin however she could not tolerate this and she was changed to doxycycline for 5 days she has completed this. She has advanced home care they are apparently putting Xeroform over the wound. When she left the hospital she was supposed to be putting Santyl over the black areas on the left plantar foot covered with Xeroform. X-rays done in the hospital did not show evidence of osteomyelitis only a superficial plantar ulceration. She had blood cultures but I do not see a wound culture. The patient has a past medical history of a right below-knee amputation by Dr. [name] in 2012  presumably for infection in this area although I am not certain. As noted she is a type II diabetic on oral agents with a recent hemoglobin A1c of 5.5. Other past medical history includes ITP, peripheral edema, chronic kidney disease stage III, hypertension, right below-knee amputation, diastolic heart failure. She tells me she is wheelchair-bound and is nonweightbearing although she does push the wheelchair along with the left foot.  She is only using hospital sock on the left foot. ABI in our clinic was 1.06 on the left 9/7; the patient cultured Pseudomonas last week out of the deeper proximal wound. MRI that was ordered showed progressive soft tissue ulceration along the plantar aspect of the foot especially along the medial arch but also plantar to the first metatarsal phalangeal joint no drainable abscess or foreign body. There was a suspicion for early osteomyelitis in the fifth metatarsal base but she does not have a wound in this area. Also problematically is that where we put her prescription for ciprofloxacin and they did not have the antibiotic and we verified that they are out of ciprofloxacin. We did not receive the message from them to this effect The patient did see Dr. [name] of podiatry. The wound was debrided. He prescribed Santyl which I do not think she is picked up either. Lab work including a CBC sedimentation rate and C-reactive protein were ordered. Finally she is using silver alginate she has home health changing the dressing 3 times a week she does not disturb this in between times 9/23; we have not seen this lady in 2-1/2 weeks largely because of transportation issues. She still has 3 wound areas a deep area in the midfoot and area just above this and then an area on the first met head. She has been using silver alginate on the wound. They are trying to use home health however they are trying to teach her how to do this at be been very difficult for this patient to do so.  In discussing things with her she uses this foot to propel her wheelchair I have asked her to stop doing this and to use her arms. She states that she is only moving around in her house. We will need to have a look at her lab work that I quoted from the last time she was here that was ordered by Dr. [name] of podiatry. By our MRI she does not have osteomyelitis in any of the wounded areas. 10/7 2-week follow-up. The patient has 3 wounds including superficially over the left first metatarsal head, left first submetatarsal in the lateral foot and a deep area more distally. Her MRI did not show osteomyelitis in reference to the wounds that we are treating although did suggest an early possibility in the left fifth metatarsal head however there is no wound in this area. I have reviewed her inflammatory markers. Most recently on 7/26 she had a sedimentation rate of 117 although this was previously greater than 140 and then 108. Last CRP was on 8/20 at 5.1 comparing to previously at 4.27. Noteworthy that this is when she was in the hospital in July I believe. 10/19; patient missed her appointment last week because of transportation issues. We have been using silver collagen. She still has the area over the metatarsal head and the mid part of her foot which are superficial wounds about the same. Marked deterioration in the problematic distal wound. This now is right down into the fascial layer of her foot. Our intake nurse noted purulent drainage. She has a small satellite lesion towards the heel 10/28; PCR culture of the major wound she has on her proximal plantar foot grew both Peptostreptococcus and Corynebacterium. The Corynebacterium could easily be a skin contaminant but Peptostreptococcus deserve treatment and I started her on Augmentin she arrives in clinic today with the wound looking a lot better 11/5; Finishing her augmentin. Patient's foot is not doing well.  Now with a probing area laterally of  the major wound going laterally. she presents with a new wound on the lateral part of the left foot with purulent drainage. this tunnels widely and toward the plantar foot. It does not connect with the major plantar wound however I am concerned 11/12; Wound on the left lateral foot that had some purulent drainage last week I cultured and that was negative although she was still on Augmentin at that time which may have affected the culture. Other than that she has the same 3 areas on the plantar part of the left foot including one over the third metatarsal head, a deep area proximally which has always been her largest wound and one in between. The one in the third metatarsal head and one just distal to this of always been superficial where she has a deep wound in the proximal part of the foot. Today the lateral part of the deeper wound is not nearly ass easy to probe this had a considerable distance last week I told the patient that really the approach this is going to depend on the result of the MRI which is on Monday. If she has an underlying abscess and/or osteomyelitis she will need serious consideration of amputation in this patient who is nonambulatory plus or minus foot surgery and IV antibiotics. We really have not made a lot of progress with her original wounds. 12/3; surprisingly the patient's MRI did not show osteomyelitis but an extensive cellulitis soft tissue infection. With some difficulty 10 days ago we managed to get her on Levaquin and Flagyl. The patient arrives back in clinic today with her 3 original wounds. The area on the lateral foot has closed. Objective Constitutional Patient is hypertensive.. Pulse regular and within target range for patient.Marland Kitchen Respirations regular, non-labored and within target range.Marland Kitchen Appears in no distress. Vitals Time Taken: 3:27 PM, Height: 67 in, Weight: 200 lbs, BMI: 31.3, Temperature: 97.4 F, Pulse: 92 bpm, Respiratory Rate: 18 breaths/min, Blood  Pressure: 154/84 mmHg. General Notes: pt. did not check sugar Cardiovascular Pedal pulses are palpable. General Notes: Wound exam ooThe superior area over her matted third metatarsal head had thick eschar over the top which I removed there is still a small open area here but it looks clean ooThe wound in the more proximal foot looked a lot better. Callus and debris around the edges which I removed with a #5 curette and fibrinous debris over the wound surface ooThe major wound on the proximal foot has much less tunneling laterally. No palpable bone. Necrotic debris on the surface removed with a #5 curette ooThe area on the lateral foot and the tunneling has all close down Integumentary (Hair, Skin) Wound #1 status is Open. Original cause of wound was Blister. The wound is located on the Left,Proximal,Plantar Foot. The wound measures 0.7cm length x 1.8cm width x 0.4cm depth; 0.99cm^2 area and 0.396cm^3 volume. There is muscle and Fat Layer (Subcutaneous Tissue) exposed. There is no tunneling or undermining noted. There is a medium amount of serosanguineous drainage noted. The wound margin is well defined and not attached to the wound base. There is medium (34-66%) pink granulation within the wound bed. There is a medium (34-66%) amount of necrotic tissue within the wound bed including Adherent Slough. Wound #2 status is Open. Original cause of wound was Blister. The wound is located on the [place]. The wound measures 0.9cm length x 2.8cm width x 1.2cm depth; 1.979cm^2 area and 2.375cm^3 volume. There is Fat  Layer (Subcutaneous Tissue) exposed. There is no tunneling or undermining noted. There is a medium amount of serosanguineous drainage noted. The wound margin is epibole. There is medium (34-66%) pink granulation within the wound bed. There is a medium (34-66%) amount of necrotic tissue within the wound bed including Adherent Slough. Wound #3 status is Open. Original cause of  wound was Blister. The wound is located on the Left Metatarsal head first. The wound measures 0cm length x 0cm width x 0cm depth; 0cm^2 area and 0cm^3 volume. There is no tunneling or undermining noted. There is a none present amount of drainage noted. The wound margin is well defined and not attached to the wound base. There is no granulation within the wound bed. There is no necrotic tissue within the wound bed. Wound #4 status is Open. Original cause of wound was Gradually Appeared. The wound is located on the Left,Lateral Foot. The wound measures 0cm length x 0cm width x 0cm depth; 0cm^2 area and 0cm^3 volume. There is Fat Layer (Subcutaneous Tissue) exposed. There is no tunneling or undermining noted. There is a medium amount of serosanguineous drainage noted. The wound margin is flat and intact. There is large (67-100%) red granulation within the wound bed. There is no necrotic tissue within the wound bed. 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 There was a Excisional Skin/Subcutaneous Tissue Debridement with a total area of 1.26 sq cm performed by Ricard Dillon., MD. With the following instrument(s): Curette to remove Viable and Non-Viable tissue/material. Material removed includes Callus, Subcutaneous Tissue, Slough, and Skin: Epidermis after achieving pain control using Other (benzocaine 20% spray). No specimens were taken. A time out was conducted at 16:00, prior to the start of the procedure. A Minimum amount of bleeding was controlled with Pressure. The procedure was tolerated  well with a pain level of 0 throughout and a pain level of 0 following the procedure. Post Debridement Measurements: 0.7cm length x 1.8cm width x 0.4cm depth; 0.396cm^3 volume. Character of Wound/Ulcer Post Debridement is improved. Severity of Tissue Post Debridement is: Fat layer exposed. Post procedure Diagnosis Wound #1: Same as Pre-Procedure Wound #3 Pre-procedure diagnosis of Wound #3 is a Diabetic Wound/Ulcer of the Lower Extremity located on the Left Metatarsal head first .Severity of Tissue Pre Debridement is: Fat layer exposed. There was a Selective/Open Wound Skin/Epidermis Debridement with a total area of 1 sq cm performed by Ricard Dillon., MD. With the following instrument(s): Curette to remove Viable and Non-Viable tissue/material. Material removed includes Callus and Skin: Epidermis and after achieving pain control using Other (benzocaine 20% spray). No specimens were taken. A time out was conducted at 16:00, prior to the start of the procedure. A Minimum amount of bleeding was controlled with Pressure. The procedure was tolerated well with a pain level of 0 throughout and a pain level of 0 following the procedure. Post Debridement Measurements: 0.3cm length x 0.4cm width x 0.1cm depth; 0.009cm^3 volume. Character of Wound/Ulcer Post Debridement is improved. Severity of Tissue Post Debridement is: Fat layer exposed. Post procedure Diagnosis Wound #3: Same as Pre-Procedure Plan Follow-up Appointments: Return Appointment in 2 weeks. Bathing/ Shower/  Hygiene: May shower with protection but do not get wound dressing(s) wet. Edema Control - Lymphedema / SCD / Other: Elevate legs to the level of the heart or above for 30 minutes daily and/or when sitting, a frequency of: Off-Loading: Other: - minimal weight bearing left foot, try not to navigate wheelchair with left foot Home Health: No change in wound care orders this week; continue Home Health for wound care. May utilize  formulary equivalent dressing for wound treatment orders unless otherwise specified. Other Home Health Orders/Instructions: - [facility] The following medication(s) was prescribed: benzocaine topical 20 % aerosol aerosol topical for prior to debridement was prescribed at facility WOUND #1: - Foot Wound Laterality: Plantar, Left, Proximal Peri-Wound Care: Sween Lotion (Moisturizing lotion) (Home Health) 3 x Per Week/30 Days Discharge Instructions: Apply moisturizing lotion or equivalent moisterizing lotion to foot and leg with dressing changes Prim Dressing: KerraCel Ag Gelling Fiber Dressing, 4x5 in (silver alginate) (Home Health) 3 x Per Week/30 Days ary Discharge Instructions: Apply silver alginate to wound bed, pack lightly into any depth or undermining Secondary Dressing: Woven Gauze Sponge, Non-Sterile 4x4 in (Home Health) 3 x Per Week/30 Days Discharge Instructions: Apply over primary dressing as directed. Secondary Dressing: ABD Pad, 5x9 (Home Health) 3 x Per Week/30 Days Discharge Instructions: Apply over primary dressing as directed. Secured With: [employer], 4.5x3.1 (in/yd) (Home Health) 3 x Per Week/30 Days Discharge Instructions: Secure with Kerlix as directed. WOUND #2: - Foot Wound Laterality: Plantar, Left, Distal Peri-Wound Care: Sween Lotion (Moisturizing lotion) (Home Health) 3 x Per Week/30 Days Discharge Instructions: Apply moisturizing lotion or equivalent moisterizing lotion to foot and leg with dressing changes Prim Dressing: KerraCel Ag Gelling Fiber Dressing, 4x5 in (silver alginate) ([place]) 3 x Per Week/30 Days ary Discharge Instructions: Apply silver alginate to wound bed, pack lightly into any depth or undermining Secondary Dressing: Woven Gauze Sponge, Non-Sterile 4x4 in (Home Health) 3 x Per Week/30 Days Discharge Instructions: Apply over primary dressing as directed. Secondary Dressing: ABD Pad, 5x9 (Home Health) 3 x Per Week/30  Days Discharge Instructions: Apply over primary dressing as directed. Secured With: [employer], 4.5x3.1 (in/yd) (Home Health) 3 x Per Week/30 Days Discharge Instructions: Secure with Kerlix as directed. WOUND #3: - Metatarsal head first Wound Laterality: Left Peri-Wound Care: Sween Lotion (Moisturizing lotion) (Home Health) 3 x Per Week/30 Days Discharge Instructions: Apply moisturizing lotion or equivalent moisterizing lotion to foot and leg with dressing changes Prim Dressing: KerraCel Ag Gelling Fiber Dressing, 4x5 in (silver alginate) ([place]) 3 x Per Week/30 Days ary Discharge Instructions: Apply silver alginate to wound bed, pack lightly into any depth or undermining Secondary Dressing: Woven Gauze Sponge, Non-Sterile 4x4 in (Home Health) 3 x Per Week/30 Days Discharge Instructions: Apply over primary dressing as directed. Secondary Dressing: ABD Pad, 5x9 (Home Health) 3 x Per Week/30 Days Discharge Instructions: Apply over primary dressing as directed. Secured With: [employer], 4.5x3.1 (in/yd) (Home Health) 3 x Per Week/30 Days Discharge Instructions: Secure with Kerlix as directed. WOUND #4: - Foot Wound Laterality: Left, Lateral Peri-Wound Care: Sween Lotion (Moisturizing lotion) (Home Health) 3 x Per Week/30 Days Discharge Instructions: Apply moisturizing lotion or equivalent moisterizing lotion to foot and leg with dressing changes Prim Dressing: KerraCel Ag Gelling Fiber Dressing, 4x5 in (silver alginate) ([place]) 3 x Per Week/30 Days ary Discharge Instructions: Apply silver alginate to wound bed, pack lightly into any depth or undermining Secondary Dressing: Woven Gauze Sponge, Non-Sterile 4x4  in [facility]) 3 x Per Week/30 Days Discharge Instructions: Apply over primary dressing as directed. Secondary Dressing: ABD Pad, 5x9 (Home Health) 3 x Per Week/30 Days Discharge Instructions: Apply over primary dressing as directed. Secured With: [employer], 4.5x3.1 (in/yd) (Home Health) 3 x Per Week/30 Days Discharge Instructions: Secure with Kerlix as directed. 1. We are using silver alginate to the wound areas 2. The patient can finish the Flagyl after 2 weeks I am going to give her another 2 weeks of Levaquin she had an extensive infection which I do not think was limited just to the skin. I think she probably had soft tissue involvement resulting in the tracking towards her lateral foot but fortunately no identifiable osteomyelitis by MRI. 3. I emphasized to her that it she needs to follow-up here. She was having transportation difficulties. If she cannot come here she is going to need to follow-up somewhere else. I also emphasized religious offloading as she was pushing her wheelchair with her foot Electronic Signature(s) Signed: 12/07/2019 5:12:12 PM By: Linton Ham MD Entered By: Linton Ham on 12/07/2019 16:48:37 -------------------------------------------------------------------------------- SuperBill Details Patient Name: Date of Service: [name], [name] 12/07/2019 Medical Record Number: [id number] Patient Account Number: [id number] Date of Birth/Sex: Treating RN: Dec 29, 1965 (54 y.o. [name] Primary Care Provider: [name] Other Clinician: Referring Provider: Treating Provider/Extender: Tonette Bihari, [name] in Treatment: 14 Diagnosis Coding ICD-10 Codes Code Description E11.621 Type 2 diabetes mellitus with foot ulcer L97.523 Non-pressure chronic ulcer of other part of left foot with necrosis of muscle Z89.511 Acquired absence of right leg below knee M86.672 Other chronic osteomyelitis, left ankle and foot Facility Procedures CPT4 Code: [id number] Description: 63335 - DEB SUBQ TISSUE 20 SQ CM/< ICD-10 Diagnosis Description L97.523 Non-pressure chronic ulcer of other part of left foot with necrosis of muscle Modifier: Quantity: 1 CPT4 Code: [id number] Description: 93734 -  DEBRIDE WOUND 1ST 20 SQ CM OR < ICD-10 Diagnosis Description E11.621 Type 2 diabetes mellitus with foot ulcer Modifier: Quantity: 1 Physician Procedures : CPT4 Code Description Modifier [id number] - WC PHYS SUBQ TISS 20 SQ CM ICD-10 Diagnosis Description L97.523 Non-pressure chronic ulcer of other part of left foot with necrosis of muscle Quantity: 1 : [id number] - WC PHYS DEBR WO ANESTH 20 SQ CM ICD-10 Diagnosis Description E11.621 Type 2 diabetes mellitus with foot ulcer Quantity: 1 Electronic Signature(s) Signed: 12/07/2019 5:01:53 PM By: Baruch Gouty RN, [name] Signed: 12/07/2019 5:12:12 PM By: Linton Ham MD Entered By: Baruch Gouty on 12/07/2019 16:15:57

## 2019-12-10 NOTE — Progress Notes (Signed)
[name], [name] ([id number]) Visit Report for 12/07/2019 Arrival Information Details Patient Name: Date of Service: [place] [name], [name] 12/07/2019 3:30 PM Medical Record Number: [id number] Patient Account Number: [id number] Date of Birth/Sex: Treating RN: Apr 11, 1965 (54 y.o. [name], [name] Primary Care Margarit Minshall: [name] Other Clinician: Referring Kaeleen Odom: Treating Kathya Wilz/Extender: Tonette Bihari, [name] in Treatment: 14 Visit Information History Since Last Visit Added or deleted any medications: No Patient Arrived: Wheel Chair Any new allergies or adverse reactions: No Arrival Time: 15:25 Had a fall or experienced change in No Accompanied By: self activities of daily living that may affect Transfer Assistance: None risk of falls: Patient Identification Verified: Yes Signs or symptoms of abuse/neglect since last visito No Secondary Verification Process Completed: Yes Hospitalized since last visit: No Patient Requires Transmission-Based Precautions: No Implantable device outside of the clinic excluding No Patient Has Alerts: No cellular tissue based products placed in the center since last visit: Has Dressing in Place as Prescribed: Yes Pain Present Now: Yes Electronic Signature(s) Signed: 12/10/2019 5:19:12 PM By: Rhae Hammock RN Entered By: Rhae Hammock on 12/07/2019 15:25:46 -------------------------------------------------------------------------------- Complex / Palliative Patient Assessment Details Patient Name: Date of Service: [name], [name] 12/07/2019 3:30 PM Medical Record Number: [id number] Patient Account Number: [id number] Date of Birth/Sex: Treating RN: April 05, 1965 (54 y.o. [name] Primary Care Anevay Campanella: [name] Other Clinician: Referring Georjean Toya: Treating Mitchelle Sultan/Extender: Tonette Bihari, [name] in Treatment: 14 Palliative Management Criteria Complex Wound Management Criteria Patient  has remarkable or complex co-morbidities requiring medications or treatments that extend wound healing times. Examples: Diabetes mellitus with chronic renal failure or end stage renal disease requiring dialysis Advanced or poorly controlled rheumatoid arthritis Diabetes mellitus and end stage chronic obstructive pulmonary disease Active cancer with current chemo- or radiation therapy diabetes, prior BKA, infection Care Approach Wound Care Plan: Complex Wound Management Electronic Signature(s) Signed: 12/07/2019 5:01:53 PM By: Baruch Gouty RN, [name] Signed: 12/07/2019 5:12:12 PM By: Linton Ham MD Entered By: Baruch Gouty on 12/07/2019 16:03:59 -------------------------------------------------------------------------------- Encounter Discharge Information Details Patient Name: Date of Service: [name], [name] 12/07/2019 3:30 PM Medical Record Number: [id number] Patient Account Number: [id number] Date of Birth/Sex: Treating RN: 12-04-1965 (54 y.o. [name], [name] Primary Care Gisell Buehrle: [name] Other Clinician: Referring [name] Colborn: Treating Dulse Rutan/Extender: Tonette Bihari, [name] in Treatment: 14 Encounter Discharge Information Items Post Procedure Vitals Discharge Condition: Stable Temperature (F): 97.4 Ambulatory Status: Wheelchair Pulse (bpm): 92 Discharge Destination: Home Respiratory Rate (breaths/min): 18 Transportation: Private Auto Blood Pressure (mmHg): 154/84 Accompanied By: self Schedule Follow-up Appointment: Yes Clinical Summary of Care: Patient Declined Electronic Signature(s) Signed: 12/07/2019 4:30:35 PM By: Rhae Hammock RN Previous Signature: 12/07/2019 4:29:59 PM Version By: Rhae Hammock RN Entered By: Rhae Hammock on 12/07/2019 16:30:34 -------------------------------------------------------------------------------- Lower Extremity Assessment Details Patient Name: Date of Service: [name], [name]  12/07/2019 3:30 PM Medical Record Number: [id number] Patient Account Number: [id number] Date of Birth/Sex: Treating RN: [birth year] (54 y.o. [name], [name] Primary Care Dollye Glasser: [name] Other Clinician: Referring Beni Turrell: Treating Pharrell Ledford/Extender: Tonette Bihari, [name] in Treatment: 14 Edema Assessment Assessed: [Left: No] [Right: No] Edema: [Left: Ye] [Right: s] Calf Left: Right: Point of Measurement: From Medial Instep 38 cm Ankle Left: Right: Point of Measurement: From Medial Instep 24 cm Knee To Floor Left: Right: From Medial Instep 44 cm Vascular Assessment Pulses: Dorsalis Pedis Palpable: [Left:Yes] Posterior Tibial Palpable: [Left:Yes] Electronic Signature(s) Signed: 12/10/2019 5:19:12 PM By: Rhae Hammock RN Entered By: Rhae Hammock on 12/07/2019  15:29:05 -------------------------------------------------------------------------------- Multi Wound Chart Details Patient Name: Date of Service: [name], [name] 12/07/2019 3:30 PM Medical Record Number: [id number] Patient Account Number: [id number] Date of Birth/Sex: Treating RN: 16-Nov-1965 (54 y.o. [name], [name] Primary Care Ysabelle Goodroe: [name] Other Clinician: Referring Duncan Alejandro: Treating Gentry Seeber/Extender: Tonette Bihari, [name] in Treatment: 14 Vital Signs Height(in): 7 Pulse(bpm): 60 Weight(lbs): 200 Blood Pressure(mmHg): 154/84 Body Mass Index(BMI): 31 Temperature(F): 97.4 Respiratory Rate(breaths/min): 18 Photos: [1:No Photos Left, Proximal, Plantar Foot] [2:No Photos Left, Distal, Plantar Foot] [3:No Photos Left Metatarsal head first] Wound Location: [1:Blister] [2:Blister] [3:Blister] Wounding Event: [1:Diabetic Wound/Ulcer of the Lower] [2:Diabetic Wound/Ulcer of the Lower] [3:Diabetic Wound/Ulcer of the Lower] Primary Etiology: [1:Extremity Cataracts, Anemia, Congestive Heart Cataracts, Anemia, Congestive Heart Cataracts, Anemia,  Congestive Heart] [2:Extremity] [3:Extremity] Comorbid History: [1:Failure, Hypertension, Type II Diabetes, Osteomyelitis, Neuropathy Diabetes, Osteomyelitis, Neuropathy Diabetes, Osteomyelitis, Neuropathy 08/02/2019] [2:Failure, Hypertension, Type II 08/02/2019] [3:Failure, Hypertension, Type II  08/02/2019] Date Acquired: [1:14] [2:14] [3:14] Weeks of Treatment: [1:Open] [2:Open] [3:Open] Wound Status: [1:0.7x1.8x0.4] [2:0.9x2.8x1.2] [3:0x0x0] Measurements L x W x D (cm) [1:0.99] [2:1.979] [3:0] A (cm) : rea [1:0.396] [2:2.375] [3:0] Volume (cm) : [1:77.50%] [2:16.00%] [3:100.00%] % Reduction in A [1:rea: 96.20%] [2:-0.80%] [3:100.00%] % Reduction in Volume: [1:Grade 2] [2:Grade 2] [3:Grade 3] Classification: [1:Medium] [2:Medium] [3:None Present] Exudate A mount: [1:Serosanguineous] [2:Serosanguineous] [3:N/A] Exudate Type: [1:red, brown] [2:red, brown] [3:N/A] Exudate Color: [1:Well defined, not attached] [2:Epibole] [3:Well defined, not attached] Wound Margin: [1:Medium (34-66%)] [2:Medium (34-66%)] [3:None Present (0%)] Granulation A mount: [1:Pink] [2:Pink] [3:N/A] Granulation Quality: [1:Medium (34-66%)] [2:Medium (34-66%)] [3:None Present (0%)] Necrotic A mount: [1:Fat Layer (Subcutaneous Tissue): Yes Fat Layer (Subcutaneous Tissue): Yes Fascia: No] Exposed Structures: [1:Muscle: Yes Fascia: No Tendon: No Joint: No Bone: No None] [2:Fascia: No Tendon: No Muscle: No Joint: No Bone: No None] [3:Fat Layer (Subcutaneous Tissue): No Tendon: No Muscle: No Joint: No Bone: No Large (67-100%)] Epithelialization: [1:Debridement - Excisional] [2:N/A] [3:Debridement - Selective/Open Wound] Debridement: Pre-procedure Verification/Time Out 16:00 [2:N/A] [3:16:00] Taken: [1:Other] [2:N/A] [3:Other] Pain Control: [1:Callus, Subcutaneous, Slough] [2:N/A] [3:Callus] Tissue Debrided: [1:Skin/Subcutaneous Tissue] [2:N/A] [3:Skin/Epidermis] Level: [1:1.26] [2:N/A] [3:1] Debridement A (sq cm): [1:rea  Curette] [2:N/A] [3:Curette] Instrument: [1:Minimum] [2:N/A] [3:Minimum] Bleeding: [1:Pressure] [2:N/A] [3:Pressure] Hemostasis A chieved: [1:0] [2:N/A] [3:0] Procedural Pain: [1:0] [2:N/A] [3:0] Post Procedural Pain: [1:Procedure was tolerated well] [2:N/A] [3:Procedure was tolerated well] Debridement Treatment Response: [1:0.7x1.8x0.4] [2:N/A] [3:0.3x0.4x0.1] Post Debridement Measurements L x W x D (cm) [1:0.396] [2:N/A] [3:0.009] Post Debridement Volume: (cm) [1:Debridement] [2:N/A] [3:Debridement] Wound Number: 4 N/A N/A Photos: Photos: No Photos N/A N/A Left, Lateral Foot N/A N/A Wound Location: Gradually Appeared N/A N/A Wounding Event: Diabetic Wound/Ulcer of the Lower N/A N/A Primary Etiology: Extremity Cataracts, Anemia, Congestive Heart N/A N/A Comorbid History: Failure, Hypertension, Type II Diabetes, Osteomyelitis, Neuropathy 11/05/2019 N/A N/A Date A cquired: 4 N/A N/A Weeks of Treatment: Open N/A N/A Wound Status: 0x0x0 N/A N/A Measurements L x W x D (cm) 0 N/A N/A A (cm) : rea 0 N/A N/A Volume (cm) : 100.00% N/A N/A % Reduction in A rea: 100.00% N/A N/A % Reduction in Volume: Grade 2 N/A N/A Classification: Medium N/A N/A Exudate A mount: Serosanguineous N/A N/A Exudate Type: red, brown N/A N/A Exudate Color: Flat and Intact N/A N/A Wound Margin: Large (67-100%) N/A N/A Granulation A mount: Red N/A N/A Granulation Quality: None Present (0%) N/A N/A Necrotic A mount: Fat Layer (Subcutaneous Tissue): Yes N/A N/A Exposed Structures: Fascia: No Tendon: No Muscle: No Joint: No Bone: No  Large (67-100%) N/A N/A Epithelialization: N/A N/A N/A Debridement: N/A N/A N/A Pain Control: N/A N/A N/A Tissue Debrided: N/A N/A N/A Level: N/A N/A N/A Debridement A (sq cm): rea N/A N/A N/A Instrument: N/A N/A N/A Bleeding: N/A N/A N/A Hemostasis A chieved: N/A N/A N/A Procedural Pain: N/A N/A N/A Post Procedural Pain: Debridement  Treatment Response: N/A N/A N/A Post Debridement Measurements L x N/A N/A N/A W x D (cm) N/A N/A N/A Post Debridement Volume: (cm) N/A N/A N/A Procedures Performed: Treatment Notes Wound #1 (Foot) Wound Laterality: Plantar, Left, Proximal Cleanser Peri-Wound Care Sween Lotion (Moisturizing lotion) Discharge Instruction: Apply moisturizing lotion or equivalent moisterizing lotion to foot and leg with dressing changes Topical Primary Dressing KerraCel Ag Gelling Fiber Dressing, 4x5 in (silver alginate) Discharge Instruction: Apply silver alginate to wound bed, pack lightly into any depth or undermining Secondary Dressing Woven Gauze Sponge, Non-Sterile 4x4 in Discharge Instruction: Apply over primary dressing as directed. ABD Pad, 5x9 Discharge Instruction: Apply over primary dressing as directed. Secured With [employer], 4.5x3.1 (in/yd) Discharge Instruction: Secure with Kerlix as directed. Compression Wrap Compression Stockings Add-Ons Wound #2 (Foot) Wound Laterality: Plantar, Left, Distal Cleanser Peri-Wound Care Sween Lotion (Moisturizing lotion) Discharge Instruction: Apply moisturizing lotion or equivalent moisterizing lotion to foot and leg with dressing changes Topical Primary Dressing KerraCel Ag Gelling Fiber Dressing, 4x5 in (silver alginate) Discharge Instruction: Apply silver alginate to wound bed, pack lightly into any depth or undermining Secondary Dressing Woven Gauze Sponge, Non-Sterile 4x4 in Discharge Instruction: Apply over primary dressing as directed. ABD Pad, 5x9 Discharge Instruction: Apply over primary dressing as directed. Secured With [employer], 4.5x3.1 (in/yd) Discharge Instruction: Secure with Kerlix as directed. Compression Wrap Compression Stockings Add-Ons Wound #3 (Metatarsal head first) Wound Laterality: Left Cleanser Peri-Wound Care Sween Lotion (Moisturizing lotion) Discharge Instruction: Apply moisturizing  lotion or equivalent moisterizing lotion to foot and leg with dressing changes Topical Primary Dressing KerraCel Ag Gelling Fiber Dressing, 4x5 in (silver alginate) Discharge Instruction: Apply silver alginate to wound bed, pack lightly into any depth or undermining Secondary Dressing Woven Gauze Sponge, Non-Sterile 4x4 in Discharge Instruction: Apply over primary dressing as directed. ABD Pad, 5x9 Discharge Instruction: Apply over primary dressing as directed. Secured With [employer], 4.5x3.1 (in/yd) Discharge Instruction: Secure with Kerlix as directed. Compression Wrap Compression Stockings Add-Ons Wound #4 (Foot) Wound Laterality: Left, Lateral Cleanser Peri-Wound Care Topical Primary Dressing Secondary Dressing Secured With Compression Wrap Compression Stockings Add-Ons Electronic Signature(s) Signed: 12/07/2019 5:01:53 PM By: Baruch Gouty RN, [name] Signed: 12/07/2019 5:12:12 PM By: Linton Ham MD Entered By: Linton Ham on 12/07/2019 16:43:21 -------------------------------------------------------------------------------- Multi-Disciplinary Care Plan Details Patient Name: Date of Service: [name], [name] 12/07/2019 3:30 PM Medical Record Number: [id number] Patient Account Number: [id number] Date of Birth/Sex: Treating RN: November 09, 1965 (54 y.o. [name] Primary Care Nanetta Wiegman: [name] Other Clinician: Referring Unika Nazareno: Treating Advay Volante/Extender: Tonette Bihari, [name] in Treatment: 14 Active Inactive Abuse / Safety / Falls / Self Care Management Nursing Diagnoses: Potential for falls Potential for injury related to falls Goals: Patient will not experience any injury related to falls Date Initiated: 08/30/2019 Target Resolution Date: 01/04/2020 Goal Status: Active Patient/caregiver will verbalize/demonstrate measures taken to prevent injury and/or falls Date Initiated: 08/30/2019 Date Inactivated:  09/27/2019 Target Resolution Date: 09/28/2019 Goal Status: Met Interventions: Assess Activities of Daily Living upon admission and as needed Assess fall risk on admission and as needed Assess: immobility, friction, shearing, incontinence upon admission and as needed  Assess impairment of mobility on admission and as needed per policy Assess personal safety and home safety (as indicated) on admission and as needed Assess self care needs on admission and as needed Provide education on fall prevention Provide education on personal and home safety Notes: Nutrition Nursing Diagnoses: Impaired glucose control: actual or potential Potential for alteratiion in Nutrition/Potential for imbalanced nutrition Goals: Patient/caregiver agrees to and verbalizes understanding of need to use nutritional supplements and/or vitamins as prescribed Date Initiated: 08/30/2019 Date Inactivated: 10/11/2019 Target Resolution Date: 10/05/2019 Goal Status: Met Patient/caregiver will maintain therapeutic glucose control Date Initiated: 08/30/2019 Target Resolution Date: 01/04/2020 Goal Status: Active Interventions: Assess HgA1c results as ordered upon admission and as needed Assess patient nutrition upon admission and as needed per policy Provide education on elevated blood sugars and impact on wound healing Provide education on nutrition Treatment Activities: Education provided on Nutrition : 09/11/2019 Notes: Wound/Skin Impairment Nursing Diagnoses: Impaired tissue integrity Knowledge deficit related to ulceration/compromised skin integrity Goals: Patient/caregiver will verbalize understanding of skin care regimen Date Initiated: 08/30/2019 Target Resolution Date: 01/04/2020 Goal Status: Active Interventions: Assess patient/caregiver ability to obtain necessary supplies Assess patient/caregiver ability to perform ulcer/skin care regimen upon admission and as needed Assess ulceration(s) every visit Provide  education on ulcer and skin care Notes: Electronic Signature(s) Signed: 12/07/2019 5:01:53 PM By: Baruch Gouty RN, [name] Entered By: Baruch Gouty on 12/07/2019 16:01:42 -------------------------------------------------------------------------------- Pain Assessment Details Patient Name: Date of Service: [name] 12/07/2019 3:30 PM Medical Record Number: [id number] Patient Account Number: [id number] Date of Birth/Sex: Treating RN: [birth year] (54 y.o. [name], [name] Primary Care Jadae Steinke: [name] Other Clinician: Referring Destin Vinsant: Treating Graceanne Guin/Extender: Tonette Bihari, [name] in Treatment: 14 Active Problems Location of Pain Severity and Description of Pain Patient Has Paino Yes Site Locations Pain Location: Pain in Ulcers With Dressing Change: Yes Rate the pain. Current Pain Level: 8 Worst Pain Level: 10 Least Pain Level: 0 Tolerable Pain Level: 8 Character of Pain Describe the Pain: Sharp Pain Management and Medication Current Pain Management: Medication: No Cold Application: No Rest: Yes Massage: No Activity: No T.E.N.S.: No Heat Application: No Leg drop or elevation: No Is the Current Pain Management Adequate: Adequate How does your wound impact your activities of daily livingo Sleep: No Bathing: No Appetite: No Relationship With Others: No Bladder Continence: No Emotions: No Bowel Continence: No Work: No Toileting: No Drive: No Dressing: No Hobbies: No Electronic Signature(s) Signed: 12/10/2019 5:19:12 PM By: Rhae Hammock RN Entered By: Rhae Hammock on 12/07/2019 15:27:44 -------------------------------------------------------------------------------- Patient/Caregiver Education Details Patient Name: Date of Service: [name] 12/3/2021andnbsp3:30 PM Medical Record Number: [id number] Patient Account Number: [id number] Date of Birth/Gender: Treating RN: 01-20-1965 (54 y.o. [name] Primary Care Physician: [name] Other Clinician: Referring Physician: Treating Physician/Extender: Sonda Primes in Treatment: 14 Education Assessment Education Provided To: Patient Education Topics Provided Elevated Blood Sugar/ Impact on Healing: Methods: Explain/Verbal Responses: Reinforcements needed, State content correctly Infection: Methods: Explain/Verbal Responses: Reinforcements needed, State content correctly Wound/Skin Impairment: Methods: Explain/Verbal Responses: Reinforcements needed, State content correctly Electronic Signature(s) Signed: 12/07/2019 5:01:53 PM By: Baruch Gouty RN, [name] Entered By: Baruch Gouty on 12/07/2019 16:02:14 -------------------------------------------------------------------------------- Wound Assessment Details Patient Name: Date of Service: [name], [name] 12/07/2019 3:30 PM Medical Record Number: [id number] Patient Account Number: [id number] Date of Birth/Sex: Treating RN: Jun 15, 1965 (54 y.o. [name], [name] Primary Care Luvinia Lucy: [name] Other Clinician: Referring Cheyla Duchemin: Treating Kymari Nuon/Extender: Tonette Bihari, [name] in  Treatment: 14 Wound Status Wound Number: 1 Primary Diabetic Wound/Ulcer of the Lower Extremity Etiology: Etiology: Wound Location: Left, Proximal, Plantar Foot Wound Open Wounding Event: Blister Status: Date Acquired: 08/02/2019 Comorbid Cataracts, Anemia, Congestive Heart Failure, Hypertension, Type Weeks Of Treatment: 14 History: II Diabetes, Osteomyelitis, Neuropathy Clustered Wound: No Wound Measurements Length: (cm) 0.7 Width: (cm) 1.8 Depth: (cm) 0.4 Area: (cm) 0.99 Volume: (cm) 0.396 % Reduction in Area: 77.5% % Reduction in Volume: 96.2% Epithelialization: None Tunneling: No Undermining: No Wound Description Classification: Grade 2 Wound Margin: Well defined, not attached Exudate Amount: Medium Exudate Type:  Serosanguineous Exudate Color: red, brown Foul Odor After Cleansing: No Slough/Fibrino Yes Wound Bed Granulation Amount: Medium (34-66%) Exposed Structure Granulation Quality: Pink Fascia Exposed: No Necrotic Amount: Medium (34-66%) Fat Layer (Subcutaneous Tissue) Exposed: Yes Necrotic Quality: Adherent Slough Tendon Exposed: No Muscle Exposed: Yes Necrosis of Muscle: No Joint Exposed: No Bone Exposed: No Treatment Notes Wound #1 (Foot) Wound Laterality: Plantar, Left, Proximal Cleanser Peri-Wound Care Sween Lotion (Moisturizing lotion) Discharge Instruction: Apply moisturizing lotion or equivalent moisterizing lotion to foot and leg with dressing changes Topical Primary Dressing KerraCel Ag Gelling Fiber Dressing, 4x5 in (silver alginate) Discharge Instruction: Apply silver alginate to wound bed, pack lightly into any depth or undermining Secondary Dressing Woven Gauze Sponge, Non-Sterile 4x4 in Discharge Instruction: Apply over primary dressing as directed. ABD Pad, 5x9 Discharge Instruction: Apply over primary dressing as directed. Secured With [employer], 4.5x3.1 (in/yd) Discharge Instruction: Secure with Kerlix as directed. Compression Wrap Compression Stockings Add-Ons Electronic Signature(s) Signed: 12/10/2019 5:19:12 PM By: Rhae Hammock RN Entered By: Rhae Hammock on 12/07/2019 15:32:03 -------------------------------------------------------------------------------- Wound Assessment Details Patient Name: Date of Service: [name], [name] 12/07/2019 3:30 PM Medical Record Number: [id number] Patient Account Number: [id number] Date of Birth/Sex: Treating RN: 04-19-1965 (54 y.o. [name], [place] Primary Care Currie Dennin: [name] Other Clinician: Referring Nichola Cieslinski: Treating Gwenette Wellons/Extender: Tonette Bihari, [name] in Treatment: 14 Wound Status Wound Number: 2 Primary Diabetic Wound/Ulcer of the Lower  Extremity Etiology: Wound Location: Left, Distal, Plantar Foot Wound Open Wounding Event: Blister Status: Date Acquired: 08/02/2019 Comorbid Cataracts, Anemia, Congestive Heart Failure, Hypertension, Type Weeks Of Treatment: 14 History: II Diabetes, Osteomyelitis, Neuropathy Clustered Wound: No Wound Measurements Length: (cm) 0.9 Width: (cm) 2.8 Depth: (cm) 1.2 Area: (cm) 1.979 Volume: (cm) 2.375 % Reduction in Area: 16% % Reduction in Volume: -0.8% Epithelialization: None Tunneling: No Undermining: No Wound Description Classification: Grade 2 Wound Margin: Epibole Exudate Amount: Medium Exudate Type: Serosanguineous Exudate Color: red, brown Foul Odor After Cleansing: No Slough/Fibrino Yes Wound Bed Granulation Amount: Medium (34-66%) Exposed Structure Granulation Quality: Pink Fascia Exposed: No Necrotic Amount: Medium (34-66%) Fat Layer (Subcutaneous Tissue) Exposed: Yes Necrotic Quality: Adherent Slough Tendon Exposed: No Muscle Exposed: No Joint Exposed: No Bone Exposed: No Treatment Notes Wound #2 (Foot) Wound Laterality: Plantar, Left, Distal Cleanser Peri-Wound Care Sween Lotion (Moisturizing lotion) Discharge Instruction: Apply moisturizing lotion or equivalent moisterizing lotion to foot and leg with dressing changes Topical Primary Dressing KerraCel Ag Gelling Fiber Dressing, 4x5 in (silver alginate) Discharge Instruction: Apply silver alginate to wound bed, pack lightly into any depth or undermining Secondary Dressing Woven Gauze Sponge, Non-Sterile 4x4 in Discharge Instruction: Apply over primary dressing as directed. ABD Pad, 5x9 Discharge Instruction: Apply over primary dressing as directed. Secured With [employer], 4.5x3.1 (in/yd) Discharge Instruction: Secure with Kerlix as directed. Compression Wrap Compression Stockings Add-Ons Electronic Signature(s) Signed: 12/10/2019 5:19:12 PM By: Rhae Hammock RN Signed: 12/10/2019  5:19:12 PM By: Rhae Hammock RN Entered By: Rhae Hammock on 12/07/2019 15:33:30 -------------------------------------------------------------------------------- Wound Assessment Details Patient Name: Date of Service: Cheryl DESHONNA, TRNKA 12/07/2019 3:30 PM Medical Record Number: [id number] Patient Account Number: [id number] Date of Birth/Sex: Treating RN: 1965-01-11 (54 y.o. [name], [name] Primary Care Korby Ratay: [name] Other Clinician: Referring Lailie Smead: Treating Florinda Taflinger/Extender: Tonette Bihari, [name] in Treatment: 14 Wound Status Wound Number: 3 Primary Diabetic Wound/Ulcer of the Lower Extremity Etiology: Wound Location: Left Metatarsal head first Wound Open Wounding Event: Blister Status: Date Acquired: 08/02/2019 Comorbid Cataracts, Anemia, Congestive Heart Failure, Hypertension, Type Weeks Of Treatment: 14 History: II Diabetes, Osteomyelitis, Neuropathy Clustered Wound: No Wound Measurements Length: (cm) Width: (cm) Depth: (cm) Area: (cm) Volume: (cm) 0 % Reduction in Area: 100% 0 % Reduction in Volume: 100% 0 Epithelialization: Large (67-100%) 0 Tunneling: No 0 Undermining: No Wound Description Classification: Grade 3 Wound Margin: Well defined, not attached Exudate Amount: None Present Foul Odor After Cleansing: No Slough/Fibrino No Wound Bed Granulation Amount: None Present (0%) Exposed Structure Necrotic Amount: None Present (0%) Fascia Exposed: No Fat Layer (Subcutaneous Tissue) Exposed: No Tendon Exposed: No Muscle Exposed: No Joint Exposed: No Bone Exposed: No Electronic Signature(s) Signed: 12/10/2019 5:19:12 PM By: Rhae Hammock RN Entered By: Rhae Hammock on 12/07/2019 15:30:38 -------------------------------------------------------------------------------- Wound Assessment Details Patient Name: Date of Service: [name] 12/07/2019 3:30 PM Medical Record Number: [id number] Patient  Account Number: [id number] Date of Birth/Sex: Treating RN: [birth year] (54 y.o. [name], [name] Primary Care Caretha Rumbaugh: [name] Other Clinician: Referring Katryna Tschirhart: Treating Khadejah Son/Extender: Tonette Bihari, [name] in Treatment: 14 Wound Status Wound Number: 4 Primary Diabetic Wound/Ulcer of the Lower Extremity Etiology: Wound Location: Left, Lateral Foot Wound Open Wounding Event: Gradually Appeared Status: Status: Date Acquired: 11/05/2019 Comorbid Cataracts, Anemia, Congestive Heart Failure, Hypertension, Type Weeks Of Treatment: 4 History: II Diabetes, Osteomyelitis, Neuropathy Clustered Wound: No Wound Measurements Length: (cm) Width: (cm) Depth: (cm) Area: (cm) Volume: (cm) 0 % Reduction in Area: 100% 0 % Reduction in Volume: 100% 0 Epithelialization: Large (67-100%) 0 Tunneling: No 0 Undermining: No Wound Description Classification: Grade 2 Wound Margin: Flat and Intact Exudate Amount: Medium Exudate Type: Serosanguineous Exudate Color: red, brown Foul Odor After Cleansing: No Slough/Fibrino Yes Wound Bed Granulation Amount: Large (67-100%) Exposed Structure Granulation Quality: Red Fascia Exposed: No Necrotic Amount: None Present (0%) Fat Layer (Subcutaneous Tissue) Exposed: Yes Tendon Exposed: No Muscle Exposed: No Joint Exposed: No Bone Exposed: No Electronic Signature(s) Signed: 12/10/2019 5:19:12 PM By: Rhae Hammock RN Entered By: Rhae Hammock on 12/07/2019 15:30:56 -------------------------------------------------------------------------------- Vitals Details Patient Name: Date of Service: [name]. 12/07/2019 3:30 PM Medical Record Number: [id number] Patient Account Number: [id number] Date of Birth/Sex: Treating RN: 08-16-1965 (54 y.o. [name], [name] Primary Care Jentri Aye: [name] Other Clinician: Referring Mindie Rawdon: Treating Mckade Gurka/Extender: Tonette Bihari, [name] in  Treatment: 14 Vital Signs Time Taken: 15:27 Temperature (F): 97.4 Height (in): 67 Pulse (bpm): 92 Weight (lbs): 200 Respiratory Rate (breaths/min): 18 Body Mass Index (BMI): 31.3 Blood Pressure (mmHg): 154/84 Reference Range: 80 - 120 mg / dl Notes pt. did not check sugar Electronic Signature(s) Signed: 12/10/2019 5:19:12 PM By: Rhae Hammock RN Entered By: Rhae Hammock on 12/07/2019 15:27:38

## 2019-12-21 ENCOUNTER — Encounter (HOSPITAL_BASED_OUTPATIENT_CLINIC_OR_DEPARTMENT_OTHER): Payer: Medicare Other | Admitting: Internal Medicine

## 2019-12-21 ENCOUNTER — Other Ambulatory Visit: Payer: Self-pay

## 2019-12-21 DIAGNOSIS — E11621 Type 2 diabetes mellitus with foot ulcer: Secondary | ICD-10-CM | POA: Diagnosis not present

## 2019-12-21 NOTE — Progress Notes (Signed)
NYELI, HOLTMEYER ([id number]) Visit Report for 12/21/2019 Debridement Details Patient Name: Date of Service: MA YURIDIA, COUTS 12/21/2019 3:00 PM Medical Record Number: [id number] Patient Account Number: [id number] Date of Birth/Sex: Treating RN: [birth year] (54 y.o. Nancy Fetter Primary Care Provider: [name] Other Clinician: Referring Provider: Treating Provider/Extender: Tonette Bihari, [name] in Treatment: 16 Debridement Performed for Assessment: Wound #1 Left,Proximal,Plantar Foot Performed By: Physician Ricard Dillon., MD Debridement Type: Debridement Severity of Tissue Pre Debridement: Fat layer exposed Level of Consciousness (Pre-procedure): Awake and Alert Pre-procedure Verification/Time Out Yes - 16:14 Taken: Start Time: 16:14 T Area Debrided (L x W): otal 0.8 (cm) x 3.1 (cm) = 2.48 (cm) Tissue and other material debrided: Viable, Non-Viable, Callus, Slough, Subcutaneous, Slough Level: Skin/Subcutaneous Tissue Debridement Description: Excisional Instrument: Curette Bleeding: Minimum Hemostasis Achieved: Pressure End Time: 16:15 Procedural Pain: 0 Post Procedural Pain: 0 Response to Treatment: Procedure was tolerated well Level of Consciousness (Post- Awake and Alert procedure): Post Debridement Measurements of Total Wound Length: (cm) 0.8 Width: (cm) 3.1 Depth: (cm) 0.9 Volume: (cm) 1.753 Character of Wound/Ulcer Post Debridement: Improved Severity of Tissue Post Debridement: Fat layer exposed Post Procedure Diagnosis Same as Pre-procedure Electronic Signature(s) Signed: 12/21/2019 4:58:57 PM By: Linton Ham MD Signed: 12/21/2019 5:46:53 PM By: Levan Hurst RN, [name] Entered By: Linton Ham on 12/21/2019 16:46:32 -------------------------------------------------------------------------------- HPI Details Patient Name: Date of Service: MA [name]. 12/21/2019 3:00 PM Medical Record Number: [id number] Patient  Account Number: [id number] Date of Birth/Sex: Treating RN: [birth year] (54 y.o. Nancy Fetter Primary Care Provider: [name] Other Clinician: Referring Provider: Treating Provider/Extender: Tonette Bihari, [name] in Treatment: 16 History of Present Illness HPI Description: 28ADMISSION 08/30/2019 This is a 54 year old woman with type 2 diabetes and peripheral neuropathy. She developed a blister on the plantar left foot on 7/27. She was seen in the emergency room treated. This did not get any better and she required admission to the hospital from 08/02/2019 through 08/09/2019. She was felt to have cellulitis of the left foot. She was treated with IV medications/antibiotics discharged on Augmentin however she could not tolerate this and she was changed to doxycycline for 5 days she has completed this. She has advanced home care they are apparently putting Xeroform over the wound. When she left the hospital she was supposed to be putting Santyl over the black areas on the left plantar foot covered with Xeroform. X-rays done in the hospital did not show evidence of osteomyelitis only a superficial plantar ulceration. She had blood cultures but I do not see a wound culture. The patient has a past medical history of a right below-knee amputation by Dr. [name] in 2012 presumably for infection in this area although I am not certain. As noted she is a type II diabetic on oral agents with a recent hemoglobin A1c of 5.5. Other past medical history includes ITP, peripheral edema, chronic kidney disease stage III, hypertension, right below-knee amputation, diastolic heart failure. She tells me she is wheelchair-bound and is nonweightbearing although she does push the wheelchair along with the left foot. She is only using hospital sock on the left foot. ABI in our clinic was 1.06 on the left 9/7; the patient cultured Pseudomonas last week out of the deeper proximal wound. MRI that was ordered  showed progressive soft tissue ulceration along the plantar aspect of the foot especially along the medial arch but also plantar to the first metatarsal phalangeal joint no drainable abscess or foreign body. There  was a suspicion for early osteomyelitis in the fifth metatarsal base but she does not have a wound in this area. Also problematically is that where we put her prescription for ciprofloxacin and they did not have the antibiotic and we verified that they are out of ciprofloxacin. We did not receive the message from them to this effect The patient did see Dr. [name] of podiatry. The wound was debrided. He prescribed Santyl which I do not think she is picked up either. Lab work including a CBC sedimentation rate and C-reactive protein were ordered. Finally she is using silver alginate she has home health changing the dressing 3 times a week she does not disturb this in between times 9/23; we have not seen this lady in 2-1/2 weeks largely because of transportation issues. She still has 3 wound areas a deep area in the midfoot and area just above this and then an area on the first met head. She has been using silver alginate on the wound. They are trying to use home health however they are trying to teach her how to do this at be been very difficult for this patient to do so. In discussing things with her she uses this foot to propel her wheelchair I have asked her to stop doing this and to use her arms. She states that she is only moving around in her house. We will need to have a look at her lab work that I quoted from the last time she was here that was ordered by Dr. [name] of podiatry. By our MRI she does not have osteomyelitis in any of the wounded areas. 10/7 2-week follow-up. The patient has 3 wounds including superficially over the left first metatarsal head, left first submetatarsal in the lateral foot and a deep area more distally. Her MRI did not show osteomyelitis in reference to  the wounds that we are treating although did suggest an early possibility in the left fifth metatarsal head however there is no wound in this area. I have reviewed her inflammatory markers. Most recently on 7/26 she had a sedimentation rate of 117 although this was previously greater than 140 and then 108. Last CRP was on 8/20 at 5.1 comparing to previously at 4.27. Noteworthy that this is when she was in the hospital in July I believe. 10/19; patient missed her appointment last week because of transportation issues. We have been using silver collagen. She still has the area over the metatarsal head and the mid part of her foot which are superficial wounds about the same. Marked deterioration in the problematic distal wound. This now is right down into the fascial layer of her foot. Our intake nurse noted purulent drainage. She has a small satellite lesion towards the heel 10/28; PCR culture of the major wound she has on her proximal plantar foot grew both Peptostreptococcus and Corynebacterium. The Corynebacterium could easily be a skin contaminant but Peptostreptococcus deserve treatment and I started her on Augmentin she arrives in clinic today with the wound looking a lot better 11/5; Finishing her augmentin. Patient's foot is not doing well. Now with a probing area laterally of the major wound going laterally. she presents with a new wound on the lateral part of the left foot with purulent drainage. this tunnels widely and toward the plantar foot. It does not connect with the major plantar wound however I am concerned 11/12; Wound on the left lateral foot that had some purulent drainage last week I cultured and that was  negative although she was still on Augmentin at that time which may have affected the culture. Other than that she has the same 3 areas on the plantar part of the left foot including one over the third metatarsal head, a deep area proximally which has always been her largest  wound and one in between. The one in the third metatarsal head and one just distal to this of always been superficial where she has a deep wound in the proximal part of the foot. Today the lateral part of the deeper wound is not nearly ass easy to probe this had a considerable distance last week I told the patient that really the approach this is going to depend on the result of the MRI which is on Monday. If she has an underlying abscess and/or osteomyelitis she will need serious consideration of amputation in this patient who is nonambulatory plus or minus foot surgery and IV antibiotics. We really have not made a lot of progress with her original wounds. 12/3; surprisingly the patient's MRI did not show osteomyelitis but an extensive cellulitis soft tissue infection. With some difficulty 10 days ago we managed to get her on Levaquin and Flagyl. The patient arrives back in clinic today with her 3 original wounds. The area on the lateral foot has closed. 12/17; 2-week follow-up. She is completing her Levaquin should have another 2 days. No additional antibiotics are felt to be necessary. The tunneling area to the lateral part of her foot is closed over. Of the 3 wounds we have been dealing with the area over the met head is fully epithelialized. The middle area it is still eschared and the major distal area is heavily surrounded by thick callus Electronic Signature(s) Signed: 12/21/2019 4:58:57 PM By: Linton Ham MD Entered By: Linton Ham on 12/21/2019 16:47:25 -------------------------------------------------------------------------------- Physical Exam Details Patient Name: Date of Service: MA [name]. 12/21/2019 3:00 PM Medical Record Number: [id number] Patient Account Number: [id number] Date of Birth/Sex: Treating RN: 1965-03-12 (54 y.o. Nancy Fetter Primary Care Provider: [name] Other Clinician: Referring Provider: Treating Provider/Extender: Tonette Bihari, [name] in Treatment: 16 Constitutional Sitting or standing Blood Pressure is within target range for patient.. Pulse regular and within target range for patient.Marland Kitchen Respirations regular, non-labored and within target range.. Temperature is normal and within the target range for the patient.Marland Kitchen Appears in no distress. Notes Wound exam Superior over area over her third metatarsal head is closed over. Still callus in this area. The middle wound in the midfoot looked a lot better still some surrounding callus I removed this with a #5 curette the wound surface looks quite good and the wound is superficial. The deeper area distally had thick callus around the margins. I used a #5 curette to remove this fibrinous necrotic debris on the wound surface. Fortunately there is no obvious tunnel going lateral Electronic Signature(s) Signed: 12/21/2019 4:58:57 PM By: Linton Ham MD Entered By: Linton Ham on 12/21/2019 16:48:38 -------------------------------------------------------------------------------- Physician Orders Details Patient Name: Date of Service: MA SUEELLEN, KAYES 12/21/2019 3:00 PM Medical Record Number: [id number] Patient Account Number: [id number] Date of Birth/Sex: Treating RN: 31-Aug-1965 (54 y.o. Nancy Fetter Primary Care Provider: [name] Other Clinician: Referring Provider: Treating Provider/Extender: Tonette Bihari, [name] in Treatment: [phone] Verbal / Phone Orders: No Diagnosis Coding ICD-10 Coding Code Description E11.621 Type 2 diabetes mellitus with foot ulcer L97.523 Non-pressure chronic ulcer of other part of left foot with necrosis of muscle Z89.511 Acquired absence of  right leg below knee M86.672 Other chronic osteomyelitis, left ankle and foot Follow-up Appointments Return appointment in 3 weeks. Bathing/ Shower/ Hygiene May shower with protection but do not get wound dressing(s) wet. Edema Control - Lymphedema /  SCD / Other Left Lower Extremity Elevate legs to the level of the heart or above for 30 minutes daily and/or when sitting, a frequency of: - throughout the day Off-Loading Other: - minimal weight bearing left foot, try not to navigate wheelchair with left foot Home Health No change in wound care orders this week; continue Home Health for wound care. May utilize formulary equivalent dressing for wound treatment orders unless otherwise specified. Other Home Health Orders/Instructions: - [facility] Wound Treatment Wound #1 - Foot Wound Laterality: Plantar, Left, Proximal Peri-Wound Care: Sween Lotion (Moisturizing lotion) (Home Health) 3 x Per Week/30 Days Discharge Instructions: Apply moisturizing lotion or equivalent moisterizing lotion to foot and leg with dressing changes Prim Dressing: KerraCel Ag Gelling Fiber Dressing, 4x5 in (silver alginate) ([place]) 3 x Per Week/30 Days ary Discharge Instructions: Apply silver alginate to wound bed, pack lightly into any depth or undermining Secondary Dressing: Woven Gauze Sponge, Non-Sterile 4x4 in (Home Health) 3 x Per Week/30 Days Discharge Instructions: Apply over primary dressing as directed. Secondary Dressing: ABD Pad, 5x9 (Home Health) 3 x Per Week/30 Days Discharge Instructions: Apply over primary dressing as directed. Secondary Dressing: Optifoam Non-Adhesive Dressing, 4x4 in (Home Health) 3 x Per Week/30 Days Discharge Instructions: Foam to first met head for protection Secured With: Kerlix Roll Sterile, 4.5x3.1 (in/yd) ([place]) 3 x Per Week/30 Days Discharge Instructions: Secure with Kerlix as directed. Secured With: Paper Tape, 2x10 (in/yd) (Home Health) 3 x Per Week/30 Days Discharge Instructions: Secure dressing with tape as directed. Wound #2 - Foot Wound Laterality: Plantar, Left, Distal Peri-Wound Care: Sween Lotion (Moisturizing lotion) (Home Health) 3 x Per Week/30 Days Discharge Instructions: Apply  moisturizing lotion or equivalent moisterizing lotion to foot and leg with dressing changes Prim Dressing: KerraCel Ag Gelling Fiber Dressing, 4x5 in (silver alginate) (Home Health) 3 x Per Week/30 Days ary Discharge Instructions: Apply silver alginate to wound bed, pack lightly into any depth or undermining Secondary Dressing: Woven Gauze Sponge, Non-Sterile 4x4 in (Home Health) 3 x Per Week/30 Days Discharge Instructions: Apply over primary dressing as directed. Secondary Dressing: ABD Pad, 5x9 (Home Health) 3 x Per Week/30 Days Discharge Instructions: Apply over primary dressing as directed. Secondary Dressing: Optifoam Non-Adhesive Dressing, 4x4 in (Home Health) 3 x Per Week/30 Days Discharge Instructions: Foam to first met head for protection Secured With: Kerlix Roll Sterile, 4.5x3.1 (in/yd) ([place]) 3 x Per Week/30 Days Discharge Instructions: Secure with Kerlix as directed. Secured With: Paper Tape, 2x10 (in/yd) (Home Health) 3 x Per Week/30 Days Discharge Instructions: Secure dressing with tape as directed. Electronic Signature(s) Signed: 12/21/2019 4:58:57 PM By: Linton Ham MD Signed: 12/21/2019 5:46:53 PM By: Levan Hurst RN, [name] Entered By: Levan Hurst on 12/21/2019 16:19:52 -------------------------------------------------------------------------------- Problem List Details Patient Name: Date of Service: MA KATTIA, SELLEY 12/21/2019 3:00 PM Medical Record Number: [id number] Patient Account Number: [id number] Date of Birth/Sex: Treating RN: [birth year] (54 y.o. Nancy Fetter Primary Care Provider: [name] Other Clinician: Referring Provider: Treating Provider/Extender: Tonette Bihari, [name] in Treatment: 16 Active Problems ICD-10 Encounter Code Description Active Date MDM Diagnosis E11.621 Type 2 diabetes mellitus with foot ulcer 08/30/2019 No Yes L97.523 Non-pressure chronic ulcer of other part of left foot with necrosis of  muscle  08/30/2019 No Yes Z89.511 Acquired absence of right leg below knee 08/30/2019 No Yes V20.233 Other chronic osteomyelitis, left ankle and foot 08/30/2019 No Yes Inactive Problems Resolved Problems Electronic Signature(s) Signed: 12/21/2019 4:58:57 PM By: Linton Ham MD Entered By: Linton Ham on 12/21/2019 16:46:06 -------------------------------------------------------------------------------- Progress Note Details Patient Name: Date of Service: MA [name]. 12/21/2019 3:00 PM Medical Record Number: [id number] Patient Account Number: [id number] Date of Birth/Sex: Treating RN: 12-07-1965 (54 y.o. Nancy Fetter Primary Care Provider: [name] Other Clinician: Referring Provider: Treating Provider/Extender: Tonette Bihari, [name] in Treatment: 16 Subjective History of Present Illness (HPI) 28ADMISSION 08/30/2019 This is a 54 year old woman with type 2 diabetes and peripheral neuropathy. She developed a blister on the plantar left foot on 7/27. She was seen in the emergency room treated. This did not get any better and she required admission to the hospital from 08/02/2019 through 08/09/2019. She was felt to have cellulitis of the left foot. She was treated with IV medications/antibiotics discharged on Augmentin however she could not tolerate this and she was changed to doxycycline for 5 days she has completed this. She has advanced home care they are apparently putting Xeroform over the wound. When she left the hospital she was supposed to be putting Santyl over the black areas on the left plantar foot covered with Xeroform. X-rays done in the hospital did not show evidence of osteomyelitis only a superficial plantar ulceration. She had blood cultures but I do not see a wound culture. The patient has a past medical history of a right below-knee amputation by Dr. [name] in 2012 presumably for infection in this area although I am not certain. As noted she  is a type II diabetic on oral agents with a recent hemoglobin A1c of 5.5. Other past medical history includes ITP, peripheral edema, chronic kidney disease stage III, hypertension, right below-knee amputation, diastolic heart failure. She tells me she is wheelchair-bound and is nonweightbearing although she does push the wheelchair along with the left foot. She is only using hospital sock on the left foot. ABI in our clinic was 1.06 on the left 9/7; the patient cultured Pseudomonas last week out of the deeper proximal wound. MRI that was ordered showed progressive soft tissue ulceration along the plantar aspect of the foot especially along the medial arch but also plantar to the first metatarsal phalangeal joint no drainable abscess or foreign body. There was a suspicion for early osteomyelitis in the fifth metatarsal base but she does not have a wound in this area. Also problematically is that where we put her prescription for ciprofloxacin and they did not have the antibiotic and we verified that they are out of ciprofloxacin. We did not receive the message from them to this effect The patient did see Dr. [name] of podiatry. The wound was debrided. He prescribed Santyl which I do not think she is picked up either. Lab work including a CBC sedimentation rate and C-reactive protein were ordered. Finally she is using silver alginate she has home health changing the dressing 3 times a week she does not disturb this in between times 9/23; we have not seen this lady in 2-1/2 weeks largely because of transportation issues. She still has 3 wound areas a deep area in the midfoot and area just above this and then an area on the first met head. She has been using silver alginate on the wound. They are trying to use home health however they are trying to teach her  how to do this at be been very difficult for this patient to do so. In discussing things with her she uses this foot to propel her wheelchair  I have asked her to stop doing this and to use her arms. She states that she is only moving around in her house. We will need to have a look at her lab work that I quoted from the last time she was here that was ordered by Dr. [name] of podiatry. By our MRI she does not have osteomyelitis in any of the wounded areas. 10/7 2-week follow-up. The patient has 3 wounds including superficially over the left first metatarsal head, left first submetatarsal in the lateral foot and a deep area more distally. Her MRI did not show osteomyelitis in reference to the wounds that we are treating although did suggest an early possibility in the left fifth metatarsal head however there is no wound in this area. I have reviewed her inflammatory markers. Most recently on 7/26 she had a sedimentation rate of 117 although this was previously greater than 140 and then 108. Last CRP was on 8/20 at 5.1 comparing to previously at 4.27. Noteworthy that this is when she was in the hospital in July I believe. 10/19; patient missed her appointment last week because of transportation issues. We have been using silver collagen. She still has the area over the metatarsal head and the mid part of her foot which are superficial wounds about the same. Marked deterioration in the problematic distal wound. This now is right down into the fascial layer of her foot. Our intake nurse noted purulent drainage. She has a small satellite lesion towards the heel 10/28; PCR culture of the major wound she has on her proximal plantar foot grew both Peptostreptococcus and Corynebacterium. The Corynebacterium could easily be a skin contaminant but Peptostreptococcus deserve treatment and I started her on Augmentin she arrives in clinic today with the wound looking a lot better 11/5; Finishing her augmentin. Patient's foot is not doing well. Now with a probing area laterally of the major wound going laterally. she presents with a new wound on the  lateral part of the left foot with purulent drainage. this tunnels widely and toward the plantar foot. It does not connect with the major plantar wound however I am concerned 11/12; Wound on the left lateral foot that had some purulent drainage last week I cultured and that was negative although she was still on Augmentin at that time which may have affected the culture. Other than that she has the same 3 areas on the plantar part of the left foot including one over the third metatarsal head, a deep area proximally which has always been her largest wound and one in between. The one in the third metatarsal head and one just distal to this of always been superficial where she has a deep wound in the proximal part of the foot. Today the lateral part of the deeper wound is not nearly ass easy to probe this had a considerable distance last week I told the patient that really the approach this is going to depend on the result of the MRI which is on Monday. If she has an underlying abscess and/or osteomyelitis she will need serious consideration of amputation in this patient who is nonambulatory plus or minus foot surgery and IV antibiotics. We really have not made a lot of progress with her original wounds. 12/3; surprisingly the patient's MRI did not show osteomyelitis but an extensive  cellulitis soft tissue infection. With some difficulty 10 days ago we managed to get her on Levaquin and Flagyl. The patient arrives back in clinic today with her 3 original wounds. The area on the lateral foot has closed. 12/17; 2-week follow-up. She is completing her Levaquin should have another 2 days. No additional antibiotics are felt to be necessary. The tunneling area to the lateral part of her foot is closed over. Of the 3 wounds we have been dealing with the area over the met head is fully epithelialized. The middle area it is still eschared and the major distal area is heavily surrounded by thick  callus Objective Constitutional Sitting or standing Blood Pressure is within target range for patient.. Pulse regular and within target range for patient.Marland Kitchen Respirations regular, non-labored and within target range.. Temperature is normal and within the target range for the patient.Marland Kitchen Appears in no distress. Vitals Time Taken: 3:49 PM, Height: 67 in, Weight: 200 lbs, BMI: 31.3, Temperature: 97.9 F, Pulse: 79 bpm, Respiratory Rate: 18 breaths/min, Blood Pressure: 144/78 mmHg. General Notes: Wound exam ooSuperior over area over her third metatarsal head is closed over. Still callus in this area. ooThe middle wound in the midfoot looked a lot better still some surrounding callus I removed this with a #5 curette the wound surface looks quite good and the wound is superficial. ooThe deeper area distally had thick callus around the margins. I used a #5 curette to remove this fibrinous necrotic debris on the wound surface. Fortunately there is no obvious tunnel going lateral Integumentary (Hair, Skin) Wound #1 status is Open. Original cause of wound was Blister. The wound is located on the Left,Proximal,Plantar Foot. The wound measures 0.8cm length x 3.1cm width x 0.9cm depth; 1.948cm^2 area and 1.753cm^3 volume. There is muscle and Fat Layer (Subcutaneous Tissue) exposed. There is no tunneling or undermining noted. There is a medium amount of serosanguineous drainage noted. The wound margin is well defined and not attached to the wound base. There is medium (34-66%) pink granulation within the wound bed. There is a medium (34-66%) amount of necrotic tissue within the wound bed including Adherent Slough. Wound #2 status is Open. Original cause of wound was Blister. The wound is located on the [place]. The wound measures 0.5cm length x 1.2cm width x 0.3cm depth; 0.471cm^2 area and 0.141cm^3 volume. There is Fat Layer (Subcutaneous Tissue) exposed. There is no tunneling or  undermining noted. There is a medium amount of serosanguineous drainage noted. The wound margin is epibole. There is medium (34-66%) pink granulation within the wound bed. There is a medium (34-66%) amount of necrotic tissue within the wound bed including Adherent Slough. Wound #3 status is Healed - Epithelialized. Original cause of wound was Blister. The wound is located on the Left Metatarsal head first. The wound measures 0cm length x 0cm width x 0cm depth; 0cm^2 area and 0cm^3 volume. There is no tunneling or undermining noted. There is a none present amount of drainage noted. The wound margin is well defined and not attached to the wound base. There is no granulation within the wound bed. There is no necrotic tissue within the wound bed. 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 There was a Excisional Skin/Subcutaneous Tissue Debridement with a total area of 2.48 sq cm performed by Ricard Dillon., MD. With the following instrument(s): Curette to remove Viable and Non-Viable tissue/material. Material removed includes Callus, Subcutaneous Tissue, and Slough. No specimens were taken. A time out was conducted at 16:14, prior to the start of the procedure. A Minimum amount of bleeding was controlled with Pressure. The procedure was tolerated well with a pain level of 0 throughout and a pain level of 0 following the procedure. Post Debridement Measurements: 0.8cm length x 3.1cm width x 0.9cm depth; 1.753cm^3 volume. Character of Wound/Ulcer Post Debridement is improved. Severity of Tissue  Post Debridement is: Fat layer exposed. Post procedure Diagnosis Wound #1: Same as Pre-Procedure Plan Follow-up Appointments: Return appointment in 3 weeks. Bathing/ Shower/ Hygiene: May shower with protection but do not get wound dressing(s) wet. Edema Control - Lymphedema / SCD / Other: Elevate legs to the level of the heart or above for 30 minutes daily and/or when sitting, a frequency of: - throughout the day Off-Loading: Other: - minimal weight bearing left foot, try not to navigate wheelchair with left foot Home Health: No change in wound care orders this week; continue Home Health for wound care. May utilize formulary equivalent dressing for wound treatment orders unless otherwise specified. Other Home Health Orders/Instructions: - [facility] WOUND #1: - Foot Wound Laterality: Plantar, Left, Proximal Peri-Wound Care: Sween Lotion (Moisturizing lotion) (Home Health) 3 x Per Week/30 Days Discharge Instructions: Apply moisturizing lotion or equivalent moisterizing lotion to foot and leg with dressing changes Prim Dressing: KerraCel Ag Gelling Fiber Dressing, 4x5 in (silver alginate) ([place]) 3 x Per Week/30 Days ary Discharge Instructions: Apply silver alginate to wound bed, pack lightly into any depth or undermining Secondary Dressing: Woven Gauze Sponge, Non-Sterile 4x4 in (Home Health) 3 x Per Week/30 Days Discharge Instructions: Apply over primary dressing as directed. Secondary Dressing: ABD Pad, 5x9 (Home Health) 3 x Per Week/30 Days Discharge Instructions: Apply over primary dressing as directed. Secondary Dressing: Optifoam Non-Adhesive Dressing, 4x4 in (Home Health) 3 x Per Week/30 Days Discharge Instructions: Foam to first met head for protection Secured With: Kerlix Roll Sterile, 4.5x3.1 (in/yd) ([place]) 3 x Per Week/30 Days Discharge Instructions: Secure with Kerlix as directed. Secured With: Paper T ape, 2x10 (in/yd) (Home Health) 3 x Per Week/30  Days Discharge Instructions: Secure dressing with tape as directed. WOUND #2: - Foot Wound Laterality: Plantar, Left, Distal Peri-Wound Care: Sween Lotion (Moisturizing lotion) (Home Health) 3 x Per Week/30 Days Discharge Instructions: Apply moisturizing lotion or equivalent moisterizing lotion to foot and leg with dressing changes Prim Dressing: KerraCel Ag Gelling Fiber Dressing, 4x5 in (silver alginate) ([place]) 3 x Per Week/30 Days ary Discharge Instructions: Apply silver alginate to wound bed, pack lightly into any depth or undermining Secondary Dressing: Woven Gauze Sponge, Non-Sterile 4x4 in (Home Health) 3 x Per Week/30 Days Discharge Instructions: Apply over primary dressing as directed. Secondary Dressing: ABD Pad, 5x9 (Home Health) 3 x Per Week/30 Days Discharge Instructions: Apply over primary dressing as directed. Secondary Dressing: Optifoam Non-Adhesive Dressing, 4x4 in (Home Health) 3 x Per Week/30 Days Discharge Instructions: Foam to first met head for protection Secured With: Kerlix Roll Sterile, 4.5x3.1 (in/yd) ([place]) 3 x Per Week/30 Days Discharge Instructions: Secure with Kerlix as directed. Secured With: Paper T ape, 2x10 (in/yd) (Home Health) 3 x Per Week/30 Days Discharge Instructions: Secure  dressing with tape as directed. #1 I continued with the silver alginate dressings for now. The 2 distal wounds are superficial and should close over. 2. The more proximal wound area is a larger wound. This was more extensively debrided I am going to use silver alginate here as well but will likely need to change the dressing at some point 3. She can complete her antibiotics I do not think additional antibiotics are needed. There is no evidence of a probing area laterally in her foot 4. For some reason this woman gets recurrent callus around these wounds even though she has a BKA on the other side and does not have a prosthesis. I have asked her to be vigilant about  this Electronic Signature(s) Signed: 12/21/2019 4:58:57 PM By: Linton Ham MD Entered By: Linton Ham on 12/21/2019 16:50:24 -------------------------------------------------------------------------------- SuperBill Details Patient Name: Date of Service: MA [name] 12/21/2019 Medical Record Number: [id number] Patient Account Number: [id number] Date of Birth/Sex: Treating RN: June 23, 1965 (54 y.o. Nancy Fetter Primary Care Provider: [name] Other Clinician: Referring Provider: Treating Provider/Extender: Tonette Bihari, [name] in Treatment: 16 Diagnosis Coding ICD-10 Codes Code Description E11.621 Type 2 diabetes mellitus with foot ulcer L97.523 Non-pressure chronic ulcer of other part of left foot with necrosis of muscle Z89.511 Acquired absence of right leg below knee M86.672 Other chronic osteomyelitis, left ankle and foot Facility Procedures CPT4 Code: [id number] Description: 91694 - DEB SUBQ TISSUE 20 SQ CM/< ICD-10 Diagnosis Description L97.523 Non-pressure chronic ulcer of other part of left foot with necrosis of muscle E11.621 Type 2 diabetes mellitus with foot ulcer Modifier: Quantity: 1 Physician Procedures : CPT4 Code Description Modifier [id number] - WC PHYS SUBQ TISS 20 SQ CM ICD-10 Diagnosis Description L97.523 Non-pressure chronic ulcer of other part of left foot with necrosis of muscle E11.621 Type 2 diabetes mellitus with foot ulcer Quantity: 1 Electronic Signature(s) Signed: 12/21/2019 4:58:57 PM By: Linton Ham MD Entered By: Linton Ham on 12/21/2019 16:50:45

## 2019-12-25 NOTE — Progress Notes (Signed)
[name], [name] ([id number]) Visit Report for 12/21/2019 Arrival Information Details Patient Name: Date of Service: MA ELANY, FELIX 12/21/2019 3:00 PM Medical Record Number: [id number] Patient Account Number: [id number] Date of Birth/Sex: Treating RN: Nov 26, 1965 (54 y.o. Nancy Fetter Primary Care Provider: [name] Other Clinician: Referring Provider: Treating Provider/Extender: Tonette Bihari, [name] in Treatment: 16 Visit Information History Since Last Visit Added or deleted any medications: No Patient Arrived: Wheel Chair Any new allergies or adverse reactions: No Arrival Time: 15:48 Had a fall or experienced change in No Accompanied By: self activities of daily living that may affect Transfer Assistance: None risk of falls: Patient Identification Verified: Yes Signs or symptoms of abuse/neglect since last visito No Secondary Verification Process Completed: Yes Hospitalized since last visit: No Patient Requires Transmission-Based Precautions: No Implantable device outside of the clinic excluding No Patient Has Alerts: No cellular tissue based products placed in the center since last visit: Has Dressing in Place as Prescribed: Yes Pain Present Now: No Electronic Signature(s) Signed: 12/25/2019 2:20:57 PM By: Sandre Kitty Entered By: Sandre Kitty on 12/21/2019 15:49:49 -------------------------------------------------------------------------------- Lower Extremity Assessment Details Patient Name: Date of Service: MA TIKITA, MABEE 12/21/2019 3:00 PM Medical Record Number: [id number] Patient Account Number: [id number] Date of Birth/Sex: Treating RN: 1965-07-07 (54 y.o. Nancy Fetter Primary Care Provider: [name] Other Clinician: Referring Provider: Treating Provider/Extender: Tonette Bihari, [name] in Treatment: 16 Edema Assessment Assessed: [Left: No] [Right: No] Edema: [Left: N] [Right: o] Calf Left:  Right: Point of Measurement: From Medial Instep 38 cm Ankle Left: Right: Point of Measurement: From Medial Instep 24 cm Electronic Signature(s) Signed: 12/21/2019 4:10:11 PM By: Mikeal Hawthorne EMT/HBOT/SD Signed: 12/21/2019 5:46:53 PM By: Levan Hurst RN, [name] Entered By: Mikeal Hawthorne on 12/21/2019 15:59:17 -------------------------------------------------------------------------------- Multi Wound Chart Details Patient Name: Date of Service: MA [name]. 12/21/2019 3:00 PM Medical Record Number: [id number] Patient Account Number: [id number] Date of Birth/Sex: Treating RN: [birth year] (54 y.o. Nancy Fetter Primary Care Provider: [name] Other Clinician: Referring Provider: Treating Provider/Extender: Tonette Bihari, [name] in Treatment: 16 Vital Signs Height(in): 22 Pulse(bpm): 44 Weight(lbs): 200 Blood Pressure(mmHg): 144/78 Body Mass Index(BMI): 31 Temperature(F): 97.9 Respiratory Rate(breaths/min): 18 Photos: [1:No Photos Left, Proximal, Plantar Foot] [2:No Photos Left, Distal, Plantar Foot] [3:No Photos Left Metatarsal head first] Wound Location: [1:Blister] [2:Blister] [3:Blister] Wounding Event: [1:Diabetic Wound/Ulcer of the Lower] [2:Diabetic Wound/Ulcer of the Lower] [3:Diabetic Wound/Ulcer of the Lower] Primary Etiology: [1:Extremity Cataracts, Anemia, Congestive Heart Cataracts, Anemia, Congestive Heart Cataracts, Anemia, Congestive Heart] [2:Extremity] [3:Extremity] Comorbid History: [1:Failure, Hypertension, Type II Diabetes, Osteomyelitis, Neuropathy Diabetes, Osteomyelitis, Neuropathy Diabetes, Osteomyelitis, Neuropathy 08/02/2019] [2:Failure, Hypertension, Type II 08/02/2019] [3:Failure, Hypertension, Type II  08/02/2019] Date Acquired: [1:16] [2:16] [3:16] Weeks of Treatment: [1:Open] [2:Open] [3:Healed - Epithelialized] Wound Status: [1:0.8x3.1x0.9] [2:0.5x1.2x0.3] [3:0x0x0] Measurements L x W x D (cm) [1:1.948] [2:0.471]  [3:0] A (cm) : rea [1:1.753] [2:0.141] [3:0] Volume (cm) : [1:55.70%] [2:80.00%] [3:100.00%] % Reduction in A [1:rea: 83.40%] [2:94.00%] [3:100.00%] % Reduction in Volume: [1:Grade 2] [2:Grade 2] [3:Grade 3] Classification: [1:Medium] [2:Medium] [3:None Present] Exudate A mount: [1:Serosanguineous] [2:Serosanguineous] [3:N/A] Exudate Type: [1:red, brown] [2:red, brown] [3:N/A] Exudate Color: [1:Well defined, not attached] [2:Epibole] [3:Well defined, not attached] Wound Margin: [1:Medium (34-66%)] [2:Medium (34-66%)] [3:None Present (0%)] Granulation A mount: [1:Pink] [2:Pink] [3:N/A] Granulation Quality: [1:Medium (34-66%)] [2:Medium (34-66%)] [3:None Present (0%)] Necrotic A mount: [1:Fat Layer (Subcutaneous Tissue): Yes Fat Layer (Subcutaneous Tissue): Yes Fascia: No] Exposed Structures: [1:Muscle: Yes Fascia: No Tendon:  No Joint: No Bone: No None] [2:Fascia: No Tendon: No Muscle: No Joint: No Bone: No None] [3:Fat Layer (Subcutaneous Tissue): No Tendon: No Muscle: No Joint: No Bone: No Large (67-100%)] Epithelialization: [1:Debridement - Excisional] [2:N/A] [3:N/A] Debridement: Pre-procedure Verification/Time Out 16:14 [2:N/A] [3:N/A] Taken: [1:Callus, Subcutaneous, Slough] [2:N/A] [3:N/A] Tissue Debrided: [1:Skin/Subcutaneous Tissue] [2:N/A] [3:N/A] Level: [1:2.48] [2:N/A] [3:N/A] Debridement A (sq cm): [1:rea Curette] [2:N/A] [3:N/A] Instrument: [1:Minimum] [2:N/A] [3:N/A] Bleeding: [1:Pressure] [2:N/A] [3:N/A] Hemostasis A chieved: [1:0] [2:N/A] [3:N/A] Procedural Pain: [1:0] [2:N/A] [3:N/A] Post Procedural Pain: [1:Procedure was tolerated well] [2:N/A] [3:N/A] Debridement Treatment Response: [1:0.8x3.1x0.9] [2:N/A] [3:N/A] Post Debridement Measurements L x W x D (cm) [1:1.753] [2:N/A] [3:N/A] Post Debridement Volume: (cm) [1:Debridement] [2:N/A] [3:N/A] Treatment Notes Electronic Signature(s) Signed: 12/21/2019 4:58:57 PM By: Linton Ham MD Signed: 12/21/2019  5:46:53 PM By: Levan Hurst RN, [name] Signed: 12/21/2019 5:46:53 PM By: Levan Hurst RN, [name] Entered By: Linton Ham on 12/21/2019 16:46:22 -------------------------------------------------------------------------------- Multi-Disciplinary Care Plan Details Patient Name: Date of Service: MA LEISL, SPURRIER 12/21/2019 3:00 PM Medical Record Number: [id number] Patient Account Number: [id number] Date of Birth/Sex: Treating RN: 12-Feb-1965 (54 y.o. Nancy Fetter Primary Care Provider: [name] Other Clinician: Referring Provider: Treating Provider/Extender: Tonette Bihari, [name] in Treatment: 16 Active Inactive Abuse / Safety / Falls / Self Care Management Nursing Diagnoses: Potential for falls Potential for injury related to falls Goals: Patient will not experience any injury related to falls Date Initiated: 08/30/2019 Target Resolution Date: 01/04/2020 Goal Status: Active Patient/caregiver will verbalize/demonstrate measures taken to prevent injury and/or falls Date Initiated: 08/30/2019 Date Inactivated: 09/27/2019 Target Resolution Date: 09/28/2019 Goal Status: Met Interventions: Assess Activities of Daily Living upon admission and as needed Assess fall risk on admission and as needed Assess: immobility, friction, shearing, incontinence upon admission and as needed Assess impairment of mobility on admission and as needed per policy Assess personal safety and home safety (as indicated) on admission and as needed Assess self care needs on admission and as needed Provide education on fall prevention Provide education on personal and home safety Notes: Nutrition Nursing Diagnoses: Impaired glucose control: actual or potential Potential for alteratiion in Nutrition/Potential for imbalanced nutrition Goals: Patient/caregiver agrees to and verbalizes understanding of need to use nutritional supplements and/or vitamins as prescribed Date Initiated:  08/30/2019 Date Inactivated: 10/11/2019 Target Resolution Date: 10/05/2019 Goal Status: Met Patient/caregiver will maintain therapeutic glucose control Date Initiated: 08/30/2019 Target Resolution Date: 01/04/2020 Goal Status: Active Interventions: Assess HgA1c results as ordered upon admission and as needed Assess patient nutrition upon admission and as needed per policy Provide education on elevated blood sugars and impact on wound healing Provide education on nutrition Treatment Activities: Education provided on Nutrition : 08/30/2019 Notes: Wound/Skin Impairment Nursing Diagnoses: Impaired tissue integrity Knowledge deficit related to ulceration/compromised skin integrity Goals: Patient/caregiver will verbalize understanding of skin care regimen Date Initiated: 08/30/2019 Target Resolution Date: 01/04/2020 Goal Status: Active Interventions: Assess patient/caregiver ability to obtain necessary supplies Assess patient/caregiver ability to perform ulcer/skin care regimen upon admission and as needed Assess ulceration(s) every visit Provide education on ulcer and skin care Notes: Electronic Signature(s) Signed: 12/21/2019 5:46:53 PM By: Levan Hurst RN, [name] Entered By: Levan Hurst on 12/21/2019 17:45:44 -------------------------------------------------------------------------------- Pain Assessment Details Patient Name: Date of Service: MA [name]. 12/21/2019 3:00 PM Medical Record Number: [id number] Patient Account Number: [id number] Date of Birth/Sex: Treating RN: 22-Oct-1965 (54 y.o. Nancy Fetter Primary Care Provider: [name] Other Clinician: Referring Provider: Treating Provider/Extender: Tonette Bihari, [name] in  Treatment: 16 Active Problems Location of Pain Severity and Description of Pain Patient Has Paino No Site Locations Pain Management and Medication Current Pain Management: Electronic Signature(s) Signed: 12/21/2019  5:46:53 PM By: Levan Hurst RN, [name] Signed: 12/25/2019 2:20:57 PM By: Sandre Kitty Entered By: Sandre Kitty on 12/21/2019 15:50:08 -------------------------------------------------------------------------------- Patient/Caregiver Education Details Patient Name: Date of Service: MA SHANTAE, VANTOL 12/17/2021andnbsp3:00 PM Medical Record Number: [id number] Patient Account Number: [id number] Date of Birth/Gender: Treating RN: 16-Nov-1965 (54 y.o. Nancy Fetter Primary Care Physician: [name] Other Clinician: Referring Physician: Treating Physician/Extender: Sonda Primes in Treatment: 16 Education Assessment Education Provided To: Patient Education Topics Provided Wound/Skin Impairment: Methods: Explain/Verbal Responses: State content correctly [employer]) Signed: 12/21/2019 5:46:53 PM By: Levan Hurst RN, [name] Entered By: Levan Hurst on 12/21/2019 17:45:56 -------------------------------------------------------------------------------- Wound Assessment Details Patient Name: Date of Service: MA GAYNELLE, PASTRANA 12/21/2019 3:00 PM Medical Record Number: [id number] Patient Account Number: [id number] Date of Birth/Sex: Treating RN: 1965/05/07 (54 y.o. Nancy Fetter Primary Care Tyron Manetta: [name] Other Clinician: Referring Amit Meloy: Treating Khristin Keleher/Extender: Tonette Bihari, [name] in Treatment: 16 Wound Status Wound Number: 1 Primary Diabetic Wound/Ulcer of the Lower Extremity Etiology: Wound Location: Left, Proximal, Plantar Foot Wound Open Wounding Event: Blister Status: Date Acquired: 08/02/2019 Comorbid Cataracts, Anemia, Congestive Heart Failure, Hypertension, Type Weeks Of Treatment: 16 History: II Diabetes, Osteomyelitis, Neuropathy Clustered Wound: No Photos Photo Uploaded By: Mikeal Hawthorne on 12/25/2019 10:52:47 Wound Measurements Length: (cm) 0.8 Width: (cm) 3.1 Depth: (cm)  0.9 Area: (cm) 1.948 Volume: (cm) 1.753 % Reduction in Area: 55.7% % Reduction in Volume: 83.4% Epithelialization: None Tunneling: No Undermining: No Wound Description Classification: Grade 2 Wound Margin: Well defined, not attached Exudate Amount: Medium Exudate Type: Serosanguineous Exudate Color: red, brown Foul Odor After Cleansing: No Slough/Fibrino Yes Wound Bed Granulation Amount: Medium (34-66%) Exposed Structure Granulation Quality: Pink Fascia Exposed: No Necrotic Amount: Medium (34-66%) Fat Layer (Subcutaneous Tissue) Exposed: Yes Necrotic Quality: Adherent Slough Tendon Exposed: No Muscle Exposed: Yes Necrosis of Muscle: No Joint Exposed: No Bone Exposed: No Electronic Signature(s) Signed: 12/21/2019 4:10:11 PM By: Mikeal Hawthorne EMT/HBOT/SD Signed: 12/21/2019 5:46:53 PM By: Levan Hurst RN, [name] Entered By: Mikeal Hawthorne on 12/21/2019 16:03:24 -------------------------------------------------------------------------------- Wound Assessment Details Patient Name: Date of Service: MA [name]. 12/21/2019 3:00 PM Medical Record Number: [id number] Patient Account Number: [id number] Date of Birth/Sex: Treating RN: [birth year] (54 y.o. Nancy Fetter Primary Care Refugio Vandevoorde: [name] Other Clinician: Referring Atzel Mccambridge: Treating Fady Stamps/Extender: Tonette Bihari, [name] in Treatment: 16 Wound Status Wound Number: 2 Primary Diabetic Wound/Ulcer of the Lower Extremity Etiology: Wound Location: Left, Distal, Plantar Foot Wound Open Wounding Event: Blister Status: Date Acquired: 08/02/2019 Comorbid Cataracts, Anemia, Congestive Heart Failure, Hypertension, Type Weeks Of Treatment: 16 History: II Diabetes, Osteomyelitis, Neuropathy Clustered Wound: No Photos Photo Uploaded By: Mikeal Hawthorne on 12/25/2019 10:52:48 Wound Measurements Length: (cm) 0.5 Width: (cm) 1.2 Depth: (cm) 0.3 Area: (cm) 0.471 Volume: (cm)  0.141 Wound Description Classification: Grade 2 Wound Margin: Epibole Exudate Amount: Medium Exudate Type: Serosanguineous Exudate Color: red, brown Foul Odor After Cleansing: N Slough/Fibrino Y % Reduction in Area: 80% % Reduction in Volume: 94% Epithelialization: None Tunneling: No Undermining: No o es Wound Bed Granulation Amount: Medium (34-66%) Exposed Structure Granulation Quality: Pink Fascia Exposed: No Necrotic Amount: Medium (34-66%) Fat Layer (Subcutaneous Tissue) Exposed: Yes Necrotic Quality: Adherent Slough Tendon Exposed: No Muscle Exposed: No Joint Exposed: No Bone Exposed: No Electronic Signature(s) Signed: 12/21/2019 4:10:11 PM  By: Mikeal Hawthorne EMT/HBOT/SD Signed: 12/21/2019 5:46:53 PM By: Levan Hurst RN, [name] Entered By: Mikeal Hawthorne on 12/21/2019 16:03:34 -------------------------------------------------------------------------------- Wound Assessment Details Patient Name: Date of Service: MA RAMATOULAYE, PACK 12/21/2019 3:00 PM Medical Record Number: [id number] Patient Account Number: [id number] Date of Birth/Sex: Treating RN: June 26, 1965 (54 y.o. Nancy Fetter Primary Care Taylynn Easton: [name] Other Clinician: Referring Severina Sykora: Treating Sritha Chauncey/Extender: Tonette Bihari, [name] in Treatment: 16 Wound Status Wound Number: 3 Primary Diabetic Wound/Ulcer of the Lower Extremity Etiology: Wound Location: Left Metatarsal head first Wound Healed - Epithelialized Wounding Event: Blister Status: Date Acquired: 08/02/2019 Comorbid Cataracts, Anemia, Congestive Heart Failure, Hypertension, Type Weeks Of Treatment: 16 History: II Diabetes, Osteomyelitis, Neuropathy Clustered Wound: No Photos Photo Uploaded By: Mikeal Hawthorne on 12/25/2019 10:52:25 Wound Measurements Length: (cm) Width: (cm) Depth: (cm) Area: (cm) Volume: (cm) 0 % Reduction in Area: 100% 0 % Reduction in Volume: 100% 0 Epithelialization: Large  (67-100%) 0 Tunneling: No 0 Undermining: No Wound Description Classification: Grade 3 Wound Margin: Well defined, not attached Exudate Amount: None Present Wound Bed Granulation Amount: None Present (0%) Necrotic Amount: None Present (0%) Foul Odor After Cleansing: No Slough/Fibrino No Exposed Structure Fascia Exposed: No Fat Layer (Subcutaneous Tissue) Exposed: No Tendon Exposed: No Muscle Exposed: No Joint Exposed: No Bone Exposed: No Electronic Signature(s) Signed: 12/21/2019 4:10:11 PM By: Mikeal Hawthorne EMT/HBOT/SD Signed: 12/21/2019 5:46:53 PM By: Levan Hurst RN, [name] Entered By: Mikeal Hawthorne on 12/21/2019 16:03:10 -------------------------------------------------------------------------------- Vitals Details Patient Name: Date of Service: MA [name]. 12/21/2019 3:00 PM Medical Record Number: [id number] Patient Account Number: [id number] Date of Birth/Sex: Treating RN: 1965-04-27 (54 y.o. Nancy Fetter Primary Care Nandika Stetzer: [name] Other Clinician: Referring Angelisa Winthrop: Treating Sawsan Riggio/Extender: Tonette Bihari, [name] in Treatment: 16 Vital Signs Time Taken: 15:49 Temperature (F): 97.9 Height (in): 67 Pulse (bpm): 79 Weight (lbs): 200 Respiratory Rate (breaths/min): 18 Body Mass Index (BMI): 31.3 Blood Pressure (mmHg): 144/78 Reference Range: 80 - 120 mg / dl Electronic Signature(s) Signed: 12/25/2019 2:20:57 PM By: Sandre Kitty Entered By: Sandre Kitty on 12/21/2019 15:50:03

## 2020-01-10 ENCOUNTER — Encounter (HOSPITAL_BASED_OUTPATIENT_CLINIC_OR_DEPARTMENT_OTHER): Payer: Medicare Other | Attending: Internal Medicine | Admitting: Internal Medicine

## 2020-01-10 ENCOUNTER — Other Ambulatory Visit: Payer: Self-pay

## 2020-01-10 DIAGNOSIS — Z89511 Acquired absence of right leg below knee: Secondary | ICD-10-CM | POA: Insufficient documentation

## 2020-01-10 DIAGNOSIS — E1169 Type 2 diabetes mellitus with other specified complication: Secondary | ICD-10-CM | POA: Insufficient documentation

## 2020-01-10 DIAGNOSIS — E11621 Type 2 diabetes mellitus with foot ulcer: Secondary | ICD-10-CM | POA: Diagnosis not present

## 2020-01-10 DIAGNOSIS — M86672 Other chronic osteomyelitis, left ankle and foot: Secondary | ICD-10-CM | POA: Diagnosis not present

## 2020-01-10 DIAGNOSIS — L97522 Non-pressure chronic ulcer of other part of left foot with fat layer exposed: Secondary | ICD-10-CM | POA: Diagnosis not present

## 2020-01-10 DIAGNOSIS — L97523 Non-pressure chronic ulcer of other part of left foot with necrosis of muscle: Secondary | ICD-10-CM | POA: Insufficient documentation

## 2020-01-10 NOTE — Progress Notes (Signed)
[place], GREB ([id number]) Visit Report for 01/10/2020 Debridement Details Patient Name: Date of Service: [name], [name] 01/10/2020 2:30 PM Medical Record Number: [id number] Patient Account Number: [id number] Date of Birth/Sex: Treating RN: 10-21-1965 (55 y.o. [name], [name] Primary Care Provider: Dionisio Wolf Other Clinician: Referring Provider: Treating Provider/Extender: [name], [name] in Treatment: 19 Debridement Performed for Assessment: Wound #1 Left,Proximal,Plantar Foot Performed By: Physician [name]., MD Debridement Type: Debridement Severity of Tissue Pre Debridement: Fat layer exposed Level of Consciousness (Pre-procedure): Awake and Alert Pre-procedure Verification/Time Out Yes - 15:28 Taken: Start Time: 15:29 Pain Control: Lidocaine 4% T opical Solution T Area Debrided (L x W): otal 1.5 (cm) x 3.5 (cm) = 5.25 (cm) Tissue and other material debrided: Viable, Non-Viable, Callus, Subcutaneous, Skin: Dermis , Skin: Epidermis, Fibrin/Exudate Level: Skin/Subcutaneous Tissue Debridement Description: Excisional Instrument: Curette Bleeding: Moderate Hemostasis Achieved: Pressure End Time: 15:32 Procedural Pain: 0 Post Procedural Pain: 3 Response to Treatment: Procedure was tolerated well Level of Consciousness (Post- Awake and Alert procedure): Post Debridement Measurements of Total Wound Length: (cm) 1 Width: (cm) 3.2 Depth: (cm) 0.4 Volume: (cm) 1.005 Character of Wound/Ulcer Post Debridement: Improved Severity of Tissue Post Debridement: Fat layer exposed Post Procedure Diagnosis Same as Pre-procedure Electronic Signature(s) Signed: 01/10/2020 5:18:36 PM By: Cheryl Ham MD Signed: 01/10/2020 6:28:52 PM By: [name] Entered By: [name] on 01/10/2020 15:59:29 -------------------------------------------------------------------------------- Debridement Details Patient Name: Date of Service: [name], [name] 01/10/2020 2:30 PM Medical Record Number: [id number] Patient Account Number: [id number] Date of Birth/Sex: Treating RN: 1965-04-29 (54 y.o. [name] Primary Care Provider: Dionisio Wolf Other Clinician: Referring Provider: Treating Provider/Extender: [name], [name] in Treatment: 19 Debridement Performed for Assessment: Wound #2 Left,Distal,Plantar Foot Performed By: Physician [name]., MD Debridement Type: Debridement Severity of Tissue Pre Debridement: Fat layer exposed Level of Consciousness (Pre-procedure): Awake and Alert Pre-procedure Verification/Time Out Yes - 15:28 Taken: Start Time: 15:29 Pain Control: Lidocaine 4% T opical Solution T Area Debrided (L x W): otal 1 (cm) x 1 (cm) = 1 (cm) Tissue and other material debrided: Viable, Non-Viable, Callus, Subcutaneous, Skin: Dermis , Skin: Epidermis, Fibrin/Exudate Level: Skin/Subcutaneous Tissue Debridement Description: Excisional Instrument: Curette Bleeding: Moderate Hemostasis Achieved: Pressure End Time: 15:32 Procedural Pain: 0 Post Procedural Pain: 3 Response to Treatment: Procedure was tolerated well Level of Consciousness (Post- Awake and Alert procedure): Post Debridement Measurements of Total Wound Length: (cm) 0.7 Width: (cm) 0.5 Depth: (cm) 0.3 Volume: (cm) 0.082 Character of Wound/Ulcer Post Debridement: Improved Severity of Tissue Post Debridement: Fat layer exposed Post Procedure Diagnosis Same as Pre-procedure Electronic Signature(s) Signed: 01/10/2020 5:18:36 PM By: Cheryl Ham MD Signed: 01/10/2020 6:28:52 PM By: [name] Entered By: [name] on 01/10/2020 15:59:38 -------------------------------------------------------------------------------- HPI Details Patient Name: Date of Service: [name]. 01/10/2020 2:30 PM Medical Record Number: [id number] Patient Account Number: [id number] Date of Birth/Sex: Treating RN: November 08, 1965  (55 y.o. [name] Primary Care Provider: Dionisio Wolf Other Clinician: Referring Provider: Treating Provider/Extender: [name], [name] in Treatment: 19 History of Present Illness HPI Description: 28ADMISSION 08/30/2019 This is a 55 year old woman with type 2 diabetes and peripheral neuropathy. She developed a blister on the plantar left foot on 7/27. She was seen in the emergency room treated. This did not get any better and she required admission to the hospital from 08/02/2019 through 08/09/2019. She was felt to have cellulitis of the left foot. She was treated with IV medications/antibiotics  discharged on Augmentin however she could not tolerate this and she was changed to doxycycline for 5 days she has completed this. She has advanced home care they are apparently putting Xeroform over the wound. When she left the hospital she was supposed to be putting Santyl over the black areas on the left plantar foot covered with Xeroform. X-rays done in the hospital did not show evidence of osteomyelitis only a superficial plantar ulceration. She had blood cultures but I do not see a wound culture. The patient has a past medical history of a right below-knee amputation by Dr. Doran Wolf in 2012 presumably for infection in this area although I am not certain. As noted she is a type II diabetic on oral agents with a recent hemoglobin A1c of 5.5. Other past medical history includes ITP, peripheral edema, chronic kidney disease stage III, hypertension, right below-knee amputation, diastolic heart failure. She tells me she is wheelchair-bound and is nonweightbearing although she does push the wheelchair along with the left foot. She is only using hospital sock on the left foot. ABI in our clinic was 1.06 on the left 9/7; the patient cultured Pseudomonas last week out of the deeper proximal wound. MRI that was ordered showed progressive soft tissue ulceration along the plantar aspect of  the foot especially along the medial arch but also plantar to the first metatarsal phalangeal joint no drainable abscess or foreign body. There was a suspicion for early osteomyelitis in the fifth metatarsal base but she does not have a wound in this area. Also problematically is that where we put her prescription for ciprofloxacin and they did not have the antibiotic and we verified that they are out of ciprofloxacin. We did not receive the message from them to this effect The patient did see Dr. [name] of podiatry. The wound was debrided. He prescribed Santyl which I do not think she is picked up either. Lab work including a CBC sedimentation rate and C-reactive protein were ordered. Finally she is using silver alginate she has home health changing the dressing 3 times a week she does not disturb this in between times 9/23; we have not seen this lady in 2-1/2 weeks largely because of transportation issues. She still has 3 wound areas a deep area in the midfoot and area just above this and then an area on the first met head. She has been using silver alginate on the wound. They are trying to use home health however they are trying to teach her how to do this at be been very difficult for this patient to do so. In discussing things with her she uses this foot to propel her wheelchair I have asked her to stop doing this and to use her arms. She states that she is only moving around in her house. We will need to have a look at her lab work that I quoted from the last time she was here that was ordered by Dr. [name] of podiatry. By our MRI she does not have osteomyelitis in any of the wounded areas. 10/7 2-week follow-up. The patient has 3 wounds including superficially over the left first metatarsal head, left first submetatarsal in the lateral foot and a deep area more distally. Her MRI did not show osteomyelitis in reference to the wounds that we are treating although did suggest an early  possibility in the left fifth metatarsal head however there is no wound in this area. I have reviewed her inflammatory markers. Most recently on 7/26 she had  a sedimentation rate of 117 although this was previously greater than 140 and then 108. Last CRP was on 8/20 at 5.1 comparing to previously at 4.27. Noteworthy that this is when she was in the hospital in July I believe. 10/19; patient missed her appointment last week because of transportation issues. We have been using silver collagen. She still has the area over the metatarsal head and the mid part of her foot which are superficial wounds about the same. Marked deterioration in the problematic distal wound. This now is right down into the fascial layer of her foot. Our intake nurse noted purulent drainage. She has a small satellite lesion towards the heel 10/28; PCR culture of the major wound she has on her proximal plantar foot grew both Peptostreptococcus and Corynebacterium. The Corynebacterium could easily be a skin contaminant but Peptostreptococcus deserve treatment and I started her on Augmentin she arrives in clinic today with the wound looking a lot better 11/5; Finishing her augmentin. Patient's foot is not doing well. Now with a probing area laterally of the major wound going laterally. she presents with a new wound on the lateral part of the left foot with purulent drainage. this tunnels widely and toward the plantar foot. It does not connect with the major plantar wound however I am concerned 11/12; Wound on the left lateral foot that had some purulent drainage last week I cultured and that was negative although she was still on Augmentin at that time which may have affected the culture. Other than that she has the same 3 areas on the plantar part of the left foot including one over the third metatarsal head, a deep area proximally which has always been her largest wound and one in between. The one in the third metatarsal head and  one just distal to this of always been superficial where she has a deep wound in the proximal part of the foot. Today the lateral part of the deeper wound is not nearly ass easy to probe this had a considerable distance last week I told the patient that really the approach this is going to depend on the result of the MRI which is on Monday. If she has an underlying abscess and/or osteomyelitis she will need serious consideration of amputation in this patient who is nonambulatory plus or minus foot surgery and IV antibiotics. We really have not made a lot of progress with her original wounds. 12/3; surprisingly the patient's MRI did not show osteomyelitis but an extensive cellulitis soft tissue infection. With some difficulty 10 days ago we managed to get her on Levaquin and Flagyl. The patient arrives back in clinic today with her 3 original wounds. The area on the lateral foot has closed. 12/17; 2-week follow-up. She is completing her Levaquin should have another 2 days. No additional antibiotics are felt to be necessary. The tunneling area to the lateral part of her foot is closed over. Of the 3 wounds we have been dealing with the area over the met head is fully epithelialized. The middle area it is still eschared and the major distal area is heavily surrounded by thick callus 1/6; she completed her antibiotics has not been here in 3 weeks however. We have been using silver alginate. 2 remaining wounds on the plantar foot she is a diabetic. She does not have arterial issues. MRI did not show osteomyelitis but extensive cellulitis and soft tissue infection. Electronic Signature(s) Signed: 01/10/2020 5:18:36 PM By: Cheryl Ham MD Entered By: [name] on  01/10/2020 16:01:50 -------------------------------------------------------------------------------- Physical Exam Details Patient Name: Date of Service: [name], [name] 01/10/2020 2:30 PM Medical Record Number: [id number] Patient  Account Number: [id number] Date of Birth/Sex: Treating RN: [birth year] (55 y.o. [name] Primary Care Provider: Dionisio Wolf Other Clinician: Referring Provider: Treating Provider/Extender: [name], [name] in Treatment: 19 Constitutional Patient is hypertensive.. Pulse regular and within target range for patient.Marland Kitchen Respirations regular, non-labored and within target range.. Temperature is normal and within the target range for the patient.Marland Kitchen Appears in no distress. Notes Wound exam Superior area over the third metatarsal head is still closed over still callus in this area but no obvious opening. The middle wound still callus circumferentially around the wound. I removed this with a #5 curette. The deeper wound proximally again had callus thick skin around the circumference which I removed. Hemostasis with direct pressure. Electronic Signature(s) Signed: 01/10/2020 5:18:36 PM By: Cheryl Ham MD Entered By: [name] on 01/10/2020 16:04:00 -------------------------------------------------------------------------------- Physician Orders Details Patient Name: Date of Service: [name], [name] 01/10/2020 2:30 PM Medical Record Number: [id number] Patient Account Number: [id number] Date of Birth/Sex: Treating RN: [birth year] (55 y.o. [name] Primary Care Provider: Dionisio Wolf Other Clinician: Referring Provider: Treating Provider/Extender: [name], [name] in Treatment: [phone] Verbal / Phone Orders: No Diagnosis Coding Follow-up Appointments Return Appointment in 2 weeks. Cellular or Tissue Based Products Wound #1 Left,Proximal,Plantar Foot Cellular or Tissue Based Product Type: - wound center to run insurance approval for dermagraft. Wound #2 Left,Distal,Plantar Foot Cellular or Tissue Based Product Type: - wound center to run insurance approval for dermagraft. Bathing/ Shower/ Hygiene May shower with protection but do not  get wound dressing(s) wet. Edema Control - Lymphedema / SCD / Other Left Lower Extremity Elevate legs to the level of the heart or above for 30 minutes daily and/or when sitting, a frequency of: - throughout the day Off-Loading Other: - minimal weight bearing left foot, try not to navigate wheelchair with left foot [place] wound care orders this week; continue Home Health for wound care. May utilize formulary equivalent dressing for wound treatment orders unless otherwise specified. - [facility] Wound Treatment Wound #1 - Foot Wound Laterality: Plantar, Left, Proximal Peri-Wound Care: Sween Lotion (Moisturizing lotion) (Home Health) 3 x Per Week/30 Days Discharge Instructions: Apply moisturizing lotion or equivalent moisterizing lotion to foot and leg with dressing changes Prim Dressing: Promogran Prisma Matrix, 4.34 (sq in) (silver collagen) (Home Health) 3 x Per Week/30 Days ary Discharge Instructions: Moisten collagen with saline or hydrogel Secondary Dressing: Woven Gauze Sponge, Non-Sterile 4x4 in (Home Health) 3 x Per Week/30 Days Discharge Instructions: Apply over primary dressing as directed. Secondary Dressing: ABD Pad, 5x9 (Home Health) 3 x Per Week/30 Days Discharge Instructions: Apply over primary dressing as directed. Secondary Dressing: Optifoam Non-Adhesive Dressing, 4x4 in (Home Health) 3 x Per Week/30 Days Discharge Instructions: Foam to first met head for protection Compression Wrap: Kerlix Roll 4.5x3.1 (in/yd) (Home Health) 3 x Per Week/30 Days Discharge Instructions: Apply Kerlix and Coban compression as directed. Compression Wrap: Coban Self-Adherent Wrap 4x5 (in/yd) (Home Health) 3 x Per Week/30 Days Discharge Instructions: Apply over Kerlix as directed. Wound #2 - Foot Wound Laterality: Plantar, Left, Distal Peri-Wound Care: Sween Lotion (Moisturizing lotion) (Home Health) 3 x Per Week/30 Days Discharge Instructions: Apply moisturizing lotion or  equivalent moisterizing lotion to foot and leg with dressing changes Prim Dressing: Promogran Prisma Matrix, 4.34 (sq in) (silver collagen) (Home Health) 3  x Per Week/30 Days ary Discharge Instructions: Moisten collagen with saline or hydrogel Secondary Dressing: Woven Gauze Sponge, Non-Sterile 4x4 in (Home Health) 3 x Per Week/30 Days Discharge Instructions: Apply over primary dressing as directed. Secondary Dressing: ABD Pad, 5x9 (Home Health) 3 x Per Week/30 Days Discharge Instructions: Apply over primary dressing as directed. Secondary Dressing: Optifoam Non-Adhesive Dressing, 4x4 in (Home Health) 3 x Per Week/30 Days Discharge Instructions: Foam to first met head for protection Compression Wrap: Kerlix Roll 4.5x3.1 (in/yd) (Home Health) 3 x Per Week/30 Days Discharge Instructions: Apply Kerlix and Coban compression as directed. Compression Wrap: Coban Self-Adherent Wrap 4x5 (in/yd) (Home Health) 3 x Per Week/30 Days Discharge Instructions: Apply over Kerlix as directed. Electronic Signature(s) Signed: 01/10/2020 5:18:36 PM By: Cheryl Ham MD Signed: 01/10/2020 6:28:52 PM By: [name] Entered By: [name] on 01/10/2020 15:37:00 -------------------------------------------------------------------------------- Problem List Details Patient Name: Date of Service: Cheryl SHALICE, WOODRING 01/10/2020 2:30 PM Medical Record Number: [id number] Patient Account Number: [id number] Date of Birth/Sex: Treating RN: 01-19-1965 (55 y.o. [name] Primary Care Provider: Dionisio Wolf Other Clinician: Referring Provider: Treating Provider/Extender: [name], [name] in Treatment: 19 Active Problems ICD-10 Encounter Code Description Active Date MDM Diagnosis E11.621 Type 2 diabetes mellitus with foot ulcer 08/30/2019 No Yes L97.523 Non-pressure chronic ulcer of other part of left foot with necrosis of muscle 08/30/2019 No Yes Z89.511 Acquired absence of right leg  below knee 08/30/2019 No Yes M86.672 Other chronic osteomyelitis, left ankle and foot 08/30/2019 No Yes Inactive Problems Resolved Problems Electronic Signature(s) Signed: 01/10/2020 5:18:36 PM By: Cheryl Ham MD Entered By: [name] on 01/10/2020 15:57:59 -------------------------------------------------------------------------------- Progress Note Details Patient Name: Date of Service: [name]. 01/10/2020 2:30 PM Medical Record Number: [id number] Patient Account Number: [id number] Date of Birth/Sex: Treating RN: 10-30-1965 (54 y.o. [name] Primary Care Provider: Dionisio Wolf Other Clinician: Referring Provider: Treating Provider/Extender: [name], [name] in Treatment: 19 Subjective History of Present Illness (HPI) 28ADMISSION 08/30/2019 This is a 55 year old woman with type 2 diabetes and peripheral neuropathy. She developed a blister on the plantar left foot on 7/27. She was seen in the emergency room treated. This did not get any better and she required admission to the hospital from 08/02/2019 through 08/09/2019. She was felt to have cellulitis of the left foot. She was treated with IV medications/antibiotics discharged on Augmentin however she could not tolerate this and she was changed to doxycycline for 5 days she has completed this. She has advanced home care they are apparently putting Xeroform over the wound. When she left the hospital she was supposed to be putting Santyl over the black areas on the left plantar foot covered with Xeroform. X-rays done in the hospital did not show evidence of osteomyelitis only a superficial plantar ulceration. She had blood cultures but I do not see a wound culture. The patient has a past medical history of a right below-knee amputation by Dr. Doran Wolf in 2012 presumably for infection in this area although I am not certain. As noted she is a type II diabetic on oral agents with a recent hemoglobin A1c  of 5.5. Other past medical history includes ITP, peripheral edema, chronic kidney disease stage III, hypertension, right below-knee amputation, diastolic heart failure. She tells me she is wheelchair-bound and is nonweightbearing although she does push the wheelchair along with the left foot. She is only using hospital sock on the left foot. ABI in our clinic was 1.06 on the  left 9/7; the patient cultured Pseudomonas last week out of the deeper proximal wound. MRI that was ordered showed progressive soft tissue ulceration along the plantar aspect of the foot especially along the medial arch but also plantar to the first metatarsal phalangeal joint no drainable abscess or foreign body. There was a suspicion for early osteomyelitis in the fifth metatarsal base but she does not have a wound in this area. Also problematically is that where we put her prescription for ciprofloxacin and they did not have the antibiotic and we verified that they are out of ciprofloxacin. We did not receive the message from them to this effect The patient did see Dr. [name] of podiatry. The wound was debrided. He prescribed Santyl which I do not think she is picked up either. Lab work including a CBC sedimentation rate and C-reactive protein were ordered. Finally she is using silver alginate she has home health changing the dressing 3 times a week she does not disturb this in between times 9/23; we have not seen this lady in 2-1/2 weeks largely because of transportation issues. She still has 3 wound areas a deep area in the midfoot and area just above this and then an area on the first met head. She has been using silver alginate on the wound. They are trying to use home health however they are trying to teach her how to do this at be been very difficult for this patient to do so. In discussing things with her she uses this foot to propel her wheelchair I have asked her to stop doing this and to use her arms. She states  that she is only moving around in her house. We will need to have a look at her lab work that I quoted from the last time she was here that was ordered by Dr. [name] of podiatry. By our MRI she does not have osteomyelitis in any of the wounded areas. 10/7 2-week follow-up. The patient has 3 wounds including superficially over the left first metatarsal head, left first submetatarsal in the lateral foot and a deep area more distally. Her MRI did not show osteomyelitis in reference to the wounds that we are treating although did suggest an early possibility in the left fifth metatarsal head however there is no wound in this area. I have reviewed her inflammatory markers. Most recently on 7/26 she had a sedimentation rate of 117 although this was previously greater than 140 and then 108. Last CRP was on 8/20 at 5.1 comparing to previously at 4.27. Noteworthy that this is when she was in the hospital in July I believe. 10/19; patient missed her appointment last week because of transportation issues. We have been using silver collagen. She still has the area over the metatarsal head and the mid part of her foot which are superficial wounds about the same. Marked deterioration in the problematic distal wound. This now is right down into the fascial layer of her foot. Our intake nurse noted purulent drainage. She has a small satellite lesion towards the heel 10/28; PCR culture of the major wound she has on her proximal plantar foot grew both Peptostreptococcus and Corynebacterium. The Corynebacterium could easily be a skin contaminant but Peptostreptococcus deserve treatment and I started her on Augmentin she arrives in clinic today with the wound looking a lot better 11/5; Finishing her augmentin. Patient's foot is not doing well. Now with a probing area laterally of the major wound going laterally. she presents with a new wound  on the lateral part of the left foot with purulent drainage. this tunnels  widely and toward the plantar foot. It does not connect with the major plantar wound however I am concerned 11/12; Wound on the left lateral foot that had some purulent drainage last week I cultured and that was negative although she was still on Augmentin at that time which may have affected the culture. Other than that she has the same 3 areas on the plantar part of the left foot including one over the third metatarsal head, a deep area proximally which has always been her largest wound and one in between. The one in the third metatarsal head and one just distal to this of always been superficial where she has a deep wound in the proximal part of the foot. Today the lateral part of the deeper wound is not nearly ass easy to probe this had a considerable distance last week I told the patient that really the approach this is going to depend on the result of the MRI which is on Monday. If she has an underlying abscess and/or osteomyelitis she will need serious consideration of amputation in this patient who is nonambulatory plus or minus foot surgery and IV antibiotics. We really have not made a lot of progress with her original wounds. 12/3; surprisingly the patient's MRI did not show osteomyelitis but an extensive cellulitis soft tissue infection. With some difficulty 10 days ago we managed to get her on Levaquin and Flagyl. The patient arrives back in clinic today with her 3 original wounds. The area on the lateral foot has closed. 12/17; 2-week follow-up. She is completing her Levaquin should have another 2 days. No additional antibiotics are felt to be necessary. The tunneling area to the lateral part of her foot is closed over. Of the 3 wounds we have been dealing with the area over the met head is fully epithelialized. The middle area it is still eschared and the major distal area is heavily surrounded by thick callus 1/6; she completed her antibiotics has not been here in 3 weeks however. We  have been using silver alginate. 2 remaining wounds on the plantar foot she is a diabetic. She does not have arterial issues. MRI did not show osteomyelitis but extensive cellulitis and soft tissue infection. Objective Constitutional Patient is hypertensive.. Pulse regular and within target range for patient.Marland Kitchen Respirations regular, non-labored and within target range.. Temperature is normal and within the target range for the patient.Marland Kitchen Appears in no distress. Vitals Time Taken: 2:52 PM, Height: 67 in, Weight: 200 lbs, BMI: 31.3, Temperature: 98.4 F, Pulse: 91 bpm, Respiratory Rate: 16 breaths/min, Blood Pressure: 162/91 mmHg. General Notes: Wound exam ooSuperior area over the third metatarsal head is still closed over still callus in this area but no obvious opening. The middle wound still callus circumferentially around the wound. I removed this with a #5 curette. ooThe deeper wound proximally again had callus thick skin around the circumference which I removed. Hemostasis with direct pressure. Integumentary (Hair, Skin) Wound #1 status is Open. Original cause of wound was Blister. The wound is located on the Left,Proximal,Plantar Foot. The wound measures 1cm length x 3.2cm width x 0.6cm depth; 2.513cm^2 area and 1.508cm^3 volume. There is Fat Layer (Subcutaneous Tissue) exposed. There is no tunneling noted, however, there is undermining starting at 3:00 and ending at 5:00 with a maximum distance of 0.8cm. There is a medium amount of serosanguineous drainage noted. The wound margin is well defined and not attached  to the wound base. There is medium (34-66%) pink granulation within the wound bed. There is a medium (34-66%) amount of necrotic tissue within the wound bed including Adherent Slough. Wound #2 status is Open. Original cause of wound was Blister. The wound is located on the [place]. The wound measures 0.7cm length x 1.5cm width x 0.4cm depth; 0.825cm^2 area and  0.33cm^3 volume. There is Fat Layer (Subcutaneous Tissue) exposed. There is no tunneling or undermining noted. There is a medium amount of serosanguineous drainage noted. The wound margin is thickened. There is medium (34-66%) pink granulation within the wound bed. There is a medium (34-66%) amount of necrotic tissue within the wound bed including Adherent Slough. 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 There was a Excisional Skin/Subcutaneous Tissue Debridement with a total area of 5.25 sq cm performed by [name]., MD. With the following instrument(s): Curette to remove Viable and Non-Viable tissue/material. Material removed includes Callus, Subcutaneous Tissue, Skin: Dermis, Skin: Epidermis, and Fibrin/Exudate after achieving pain control using Lidocaine 4% Topical Solution. A time out was conducted at 15:28, prior to the start of the procedure. A Moderate amount of bleeding was controlled with Pressure. The procedure was tolerated well with a pain level of 0 throughout and a pain level of 3 following the procedure. Post Debridement Measurements: 1cm length x 3.2cm width x 0.4cm depth; 1.005cm^3 volume. Character of Wound/Ulcer Post Debridement is improved. Severity of Tissue Post Debridement is: Fat layer exposed. Post procedure Diagnosis Wound #1: Same as Pre-Procedure Wound #2 Pre-procedure diagnosis of Wound #2 is a Diabetic Wound/Ulcer of the Lower Extremity located on the Left,Distal,Plantar Foot .Severity of Tissue Pre Debridement is: Fat layer exposed. There was a  Excisional Skin/Subcutaneous Tissue Debridement with a total area of 1 sq cm performed by [name]., MD. With the following instrument(s): Curette to remove Viable and Non-Viable tissue/material. Material removed includes Callus, Subcutaneous Tissue, Skin: Dermis, Skin: Epidermis, and Fibrin/Exudate after achieving pain control using Lidocaine 4% Topical Solution. A time out was conducted at 15:28, prior to the start of the procedure. A Moderate amount of bleeding was controlled with Pressure. The procedure was tolerated well with a pain level of 0 throughout and a pain level of 3 following the procedure. Post Debridement Measurements: 0.7cm length x 0.5cm width x 0.3cm depth; 0.082cm^3 volume. Character of Wound/Ulcer Post Debridement is improved. Severity of Tissue Post Debridement is: Fat layer exposed. Post procedure Diagnosis Wound #2: Same as Pre-Procedure Plan Follow-up Appointments: Return Appointment in 2 weeks. Cellular or Tissue Based Products: Wound #1 Left,Proximal,Plantar Foot: Cellular or Tissue Based Product Type: - wound center to run insurance approval for dermagraft. Wound #2 Left,Distal,Plantar Foot: Cellular or Tissue Based Product Type: - wound center to run insurance approval for dermagraft. Bathing/ Shower/ Hygiene: May shower with protection but do not get wound dressing(s) wet. Edema Control - Lymphedema / SCD / Other: Elevate legs to the level of the heart or above for 30 minutes daily and/or when sitting, a frequency of: - throughout the day Off-Loading: Other: - minimal weight bearing left foot, try not to navigate wheelchair with left foot Home  Health: New wound care orders this week; continue Home Health for wound care. May utilize formulary equivalent dressing for wound treatment orders unless otherwise specified. - [facility] WOUND #1: - Foot Wound Laterality: Plantar, Left, Proximal Peri-Wound Care: Sween Lotion (Moisturizing lotion) (Home  Health) 3 x Per Week/30 Days Discharge Instructions: Apply moisturizing lotion or equivalent moisterizing lotion to foot and leg with dressing changes Prim Dressing: Promogran Prisma Matrix, 4.34 (sq in) (silver collagen) ([facility]) 3 x Per Week/30 Days ary Discharge Instructions: Moisten collagen with saline or hydrogel Secondary Dressing: Woven Gauze Sponge, Non-Sterile 4x4 in (Home Health) 3 x Per Week/30 Days Discharge Instructions: Apply over primary dressing as directed. Secondary Dressing: ABD Pad, 5x9 (Home Health) 3 x Per Week/30 Days Discharge Instructions: Apply over primary dressing as directed. Secondary Dressing: Optifoam Non-Adhesive Dressing, 4x4 in (Home Health) 3 x Per Week/30 Days Discharge Instructions: Foam to first met head for protection Com pression Wrap: Kerlix Roll 4.5x3.1 (in/yd) (Home Health) 3 x Per Week/30 Days Discharge Instructions: Apply Kerlix and Coban compression as directed. Com pression Wrap: Coban Self-Adherent Wrap 4x5 (in/yd) (Home Health) 3 x Per Week/30 Days Discharge Instructions: Apply over Kerlix as directed. WOUND #2: - Foot Wound Laterality: Plantar, Left, Distal Peri-Wound Care: Sween Lotion (Moisturizing lotion) (Home Health) 3 x Per Week/30 Days Discharge Instructions: Apply moisturizing lotion or equivalent moisterizing lotion to foot and leg with dressing changes Prim Dressing: Promogran Prisma Matrix, 4.34 (sq in) (silver collagen) (Home Health) 3 x Per Week/30 Days ary Discharge Instructions: Moisten collagen with saline or hydrogel Secondary Dressing: Woven Gauze Sponge, Non-Sterile 4x4 in (Home Health) 3 x Per Week/30 Days Discharge Instructions: Apply over primary dressing as directed. Secondary Dressing: ABD Pad, 5x9 (Home Health) 3 x Per Week/30 Days Discharge Instructions: Apply over primary dressing as directed. Secondary Dressing: Optifoam Non-Adhesive Dressing, 4x4 in (Home Health) 3 x Per Week/30 Days Discharge  Instructions: Foam to first met head for protection Com pression Wrap: Kerlix Roll 4.5x3.1 (in/yd) (Home Health) 3 x Per Week/30 Days Discharge Instructions: Apply Kerlix and Coban compression as directed. Com pression Wrap: Coban Self-Adherent Wrap 4x5 (in/yd) (Home Health) 3 x Per Week/30 Days Discharge Instructions: Apply over Kerlix as directed. 1. I am changing the dressing to silver collagen to help moisten these wounds 2. I am going to wrap her in kerlix Coban pad the bottom of her foot. The wrap is not to control swelling its to keep the dressings in place including the secondary padding of the bottom of her foot 3. I am going to run Dermagraft through her insurance 4. No evidence of infection currently. She completed antibiotics 2 or 3 weeks ago. 5. Although she has an amputation on the right side she uses her foot to push her wheelchair I have asked her to stop this is the only other activity she does on the foot is transferring Electronic Signature(s) Signed: 01/10/2020 5:18:36 PM By: Cheryl Ham MD Entered By: [name] on 01/10/2020 16:05:39 -------------------------------------------------------------------------------- SuperBill Details Patient Name: Date of Service: [name] 01/10/2020 Medical Record Number: [id number] Patient Account Number: [id number] Date of Birth/Sex: Treating RN: 1965-09-15 (55 y.o. [name] Primary Care Provider: Dionisio Wolf Other Clinician: Referring Provider: Treating Provider/Extender: [name], [name] in Treatment: 19 Diagnosis Coding ICD-10 Codes Code Description E11.621 Type 2 diabetes mellitus with foot ulcer L97.523 Non-pressure chronic ulcer of other part of left foot with necrosis of muscle Z89.511 Acquired absence of right leg below knee  S30.746 Other chronic osteomyelitis, left ankle and foot Facility Procedures Physician Procedures : CPT4 Code Description Modifier [id number] - WC  PHYS SUBQ TISS 20 SQ CM ICD-10 Diagnosis Description L97.523 Non-pressure chronic ulcer of other part of left foot with necrosis of muscle Quantity: 1 Electronic Signature(s) Signed: 01/10/2020 5:18:36 PM By: Cheryl Ham MD Entered By: [name] on 01/10/2020 16:05:50

## 2020-01-14 DIAGNOSIS — E1151 Type 2 diabetes mellitus with diabetic peripheral angiopathy without gangrene: Secondary | ICD-10-CM | POA: Diagnosis not present

## 2020-01-14 DIAGNOSIS — I5032 Chronic diastolic (congestive) heart failure: Secondary | ICD-10-CM | POA: Diagnosis not present

## 2020-01-14 DIAGNOSIS — L97522 Non-pressure chronic ulcer of other part of left foot with fat layer exposed: Secondary | ICD-10-CM | POA: Diagnosis not present

## 2020-01-14 DIAGNOSIS — I890 Lymphedema, not elsewhere classified: Secondary | ICD-10-CM | POA: Diagnosis not present

## 2020-01-14 DIAGNOSIS — I130 Hypertensive heart and chronic kidney disease with heart failure and stage 1 through stage 4 chronic kidney disease, or unspecified chronic kidney disease: Secondary | ICD-10-CM | POA: Diagnosis not present

## 2020-01-14 DIAGNOSIS — L97422 Non-pressure chronic ulcer of left heel and midfoot with fat layer exposed: Secondary | ICD-10-CM | POA: Diagnosis not present

## 2020-01-14 DIAGNOSIS — N1832 Chronic kidney disease, stage 3b: Secondary | ICD-10-CM | POA: Diagnosis not present

## 2020-01-14 DIAGNOSIS — E1122 Type 2 diabetes mellitus with diabetic chronic kidney disease: Secondary | ICD-10-CM | POA: Diagnosis not present

## 2020-01-14 DIAGNOSIS — E11621 Type 2 diabetes mellitus with foot ulcer: Secondary | ICD-10-CM | POA: Diagnosis not present

## 2020-01-14 DIAGNOSIS — E11319 Type 2 diabetes mellitus with unspecified diabetic retinopathy without macular edema: Secondary | ICD-10-CM | POA: Diagnosis not present

## 2020-01-14 DIAGNOSIS — E1140 Type 2 diabetes mellitus with diabetic neuropathy, unspecified: Secondary | ICD-10-CM | POA: Diagnosis not present

## 2020-01-14 DIAGNOSIS — D631 Anemia in chronic kidney disease: Secondary | ICD-10-CM | POA: Diagnosis not present

## 2020-01-14 DIAGNOSIS — D693 Immune thrombocytopenic purpura: Secondary | ICD-10-CM | POA: Diagnosis not present

## 2020-01-14 DIAGNOSIS — E871 Hypo-osmolality and hyponatremia: Secondary | ICD-10-CM | POA: Diagnosis not present

## 2020-01-14 DIAGNOSIS — M86672 Other chronic osteomyelitis, left ankle and foot: Secondary | ICD-10-CM | POA: Diagnosis not present

## 2020-01-14 DIAGNOSIS — I081 Rheumatic disorders of both mitral and tricuspid valves: Secondary | ICD-10-CM | POA: Diagnosis not present

## 2020-01-14 NOTE — Progress Notes (Signed)
[name], [name] ([id number]) Visit Report for 01/10/2020 Arrival Information Details Patient Name: Date of Service: [place] [name], [name] 01/10/2020 2:30 PM Medical Record Number: [id number] Patient Account Number: [id number] Date of Birth/Sex: Treating RN: 1965/06/05 (55 y.o. Benjamine Sprague, Briant Cedar Primary Care Lyndall Windt: [name] Other Clinician: Referring Hawken Bielby: Treating Lorene Klimas/Extender: Tonette Bihari, [name] in Treatment: 103 Visit Information History Since Last Visit Added or deleted any medications: No Patient Arrived: Wheel Chair Any new allergies or adverse reactions: No Arrival Time: 14:52 Had a fall or experienced change in No Accompanied By: alone activities of daily living that may affect Transfer Assistance: None risk of falls: Patient Identification Verified: Yes Signs or symptoms of abuse/neglect since last visito No Secondary Verification Process Completed: Yes Hospitalized since last visit: No Patient Requires Transmission-Based Precautions: No Implantable device outside of the clinic excluding No Patient Has Alerts: No cellular tissue based products placed in the center since last visit: Has Dressing in Place as Prescribed: Yes Pain Present Now: No Electronic Signature(s) Signed: 01/14/2020 5:04:19 PM By: Levan Hurst RN, [name] Entered By: Levan Hurst on 01/10/2020 14:52:50 -------------------------------------------------------------------------------- Encounter Discharge Information Details Patient Name: Date of Service: [name]. 01/10/2020 2:30 PM Medical Record Number: [id number] Patient Account Number: [id number] Date of Birth/Sex: Treating RN: May 22, 1965 (55 y.o. Nancy Fetter Primary Care Ahmani Prehn: [name] Other Clinician: Referring Jackilyn Umphlett: Treating Marcell Pfeifer/Extender: Tonette Bihari, [name] in Treatment: 19 Encounter Discharge Information Items Post Procedure Vitals Discharge Condition:  Stable Temperature (F): 98.4 Ambulatory Status: Wheelchair Pulse (bpm): 91 Discharge Destination: Home Respiratory Rate (breaths/min): 16 Transportation: Private Auto Blood Pressure (mmHg): 162/91 Accompanied By: alone Schedule Follow-up Appointment: Yes Clinical Summary of Care: Patient Declined Electronic Signature(s) Signed: 01/14/2020 5:04:19 PM By: Levan Hurst RN, [name] Entered By: Levan Hurst on 01/10/2020 17:18:19 -------------------------------------------------------------------------------- Lower Extremity Assessment Details Patient Name: Date of Service: Cheryl [name], [name] 01/10/2020 2:30 PM Medical Record Number: [id number] Patient Account Number: [id number] Date of Birth/Sex: Treating RN: Jul 19, 1965 (55 y.o. Nancy Fetter Primary Care Kasyn Rolph: [name] Other Clinician: Referring Jamol Ginyard: Treating Shabnam Ladd/Extender: Tonette Bihari, [name] in Treatment: 19 Edema Assessment Assessed: [Left: No] [Right: No] Edema: [Left: N] [Right: o] Calf Left: Right: Point of Measurement: From Medial Instep 35 cm Ankle Left: Right: Point of Measurement: From Medial Instep 24.5 cm Vascular Assessment Pulses: Dorsalis Pedis Palpable: [Left:Yes] Electronic Signature(s) Signed: 01/14/2020 5:04:19 PM By: Levan Hurst RN, [name] Entered By: Levan Hurst on 01/10/2020 15:06:54 -------------------------------------------------------------------------------- Multi Wound Chart Details Patient Name: Date of Service: [name]. 01/10/2020 2:30 PM Medical Record Number: [id number] Patient Account Number: [id number] Date of Birth/Sex: Treating RN: September 18, 1965 (55 y.o. [name], Meta.Reding Primary Care Laurel Harnden: [name] Other Clinician: Referring Roslynn Holte: Treating Khristopher Kapaun/Extender: Tonette Bihari, [name] in Treatment: 19 Vital Signs Height(in): 72 Pulse(bpm): 28 Weight(lbs): 200 Blood Pressure(mmHg): 162/91 Body Mass  Index(BMI): 31 Temperature(F): 98.4 Respiratory Rate(breaths/min): 16 Photos: [1:No Photos Left, Proximal, Plantar Foot] [2:No Photos Left, Distal, Plantar Foot] [N/A:N/A N/A] Wound Location: [1:Blister] [2:Blister] [N/A:N/A] Wounding Event: [1:Diabetic Wound/Ulcer of the Lower] [2:Diabetic Wound/Ulcer of the Lower] [N/A:N/A] Primary Etiology: [1:Extremity Cataracts, Anemia, Congestive Heart] [2:Extremity Cataracts, Anemia, Congestive Heart] [N/A:N/A] Comorbid History: [1:Failure, Hypertension, Type II Diabetes, Osteomyelitis, Neuropathy 08/02/2019] [2:Failure, Hypertension, Type II Diabetes, Osteomyelitis, Neuropathy 08/02/2019] [N/A:N/A] Date Acquired: [1:19] [2:19] [N/A:N/A] Weeks of Treatment: [1:Open] [2:Open] [N/A:N/A] Wound Status: [1:1x3.2x0.6] [2:0.7x1.5x0.4] [N/A:N/A] Measurements L x W x D (cm) [1:2.513] [2:0.825] [N/A:N/A] A (cm) : rea [1:1.508] [2:0.33] [N/A:N/A] Volume (  cm) : [1:42.90%] [2:65.00%] [N/A:N/A] % Reduction in A rea: [1:85.70%] [2:86.00%] [N/A:N/A] % Reduction in Volume: [1:3] Starting Position 1 (o'clock): [1:5] Ending Position 1 (o'clock): [1:0.8] Maximum Distance 1 (cm): [1:Yes] [2:No] [N/A:N/A] Undermining: [1:Grade 2] [2:Grade 2] [N/A:N/A] Classification: [1:Medium] [2:Medium] [N/A:N/A] Exudate A mount: [1:Serosanguineous] [2:Serosanguineous] [N/A:N/A] Exudate Type: [1:red, brown] [2:red, brown] [N/A:N/A] Exudate Color: [1:Well defined, not attached] [2:Thickened] [N/A:N/A] Wound Margin: [1:Medium (34-66%)] [2:Medium (34-66%)] [N/A:N/A] Granulation A mount: [1:Pink] [2:Pink] [N/A:N/A] Granulation Quality: [1:Medium (34-66%)] [2:Medium (34-66%)] [N/A:N/A] Necrotic A mount: [1:Fat Layer (Subcutaneous Tissue): Yes Fat Layer (Subcutaneous Tissue): Yes N/A] Exposed Structures: [1:Fascia: No Tendon: No Muscle: No Joint: No Bone: No Small (1-33%)] [2:Fascia: No Tendon: No Muscle: No Joint: No Bone: No None] [N/A:N/A] Epithelialization: [1:Debridement -  Excisional] [2:Debridement - Excisional] [N/A:N/A] Debridement: Pre-procedure Verification/Time Out 15:28 [2:15:28] [N/A:N/A] Taken: [1:Lidocaine 4% Topical Solution] [2:Lidocaine 4% Topical Solution] [N/A:N/A] Pain Control: [1:Callus, Subcutaneous] [2:Callus, Subcutaneous] [N/A:N/A] Tissue Debrided: [1:Skin/Subcutaneous Tissue] [2:Skin/Subcutaneous Tissue] [N/A:N/A] Level: [1:5.25] [2:1] [N/A:N/A] Debridement A (sq cm): [1:rea Curette] [2:Curette] [N/A:N/A] Instrument: [1:Moderate] [2:Moderate] [N/A:N/A] Bleeding: [1:Pressure] [2:Pressure] [N/A:N/A] Hemostasis A chieved: [1:0] [2:0] [N/A:N/A] Procedural Pain: [1:3] [2:3] [N/A:N/A] Post Procedural Pain: [1:Procedure was tolerated well] [2:Procedure was tolerated well] [N/A:N/A] Debridement Treatment Response: [1:1x3.2x0.4] [2:0.7x0.5x0.3] [N/A:N/A] Post Debridement Measurements L x W x D (cm) [1:1.005] [2:0.082] [N/A:N/A] Post Debridement Volume: (cm) [1:Debridement] [2:Debridement] [N/A:N/A] Treatment Notes Electronic Signature(s) Signed: 01/10/2020 5:18:36 PM By: Linton Ham MD Signed: 01/10/2020 6:28:52 PM By: Deon Pilling Entered By: Linton Ham on 01/10/2020 15:59:15 -------------------------------------------------------------------------------- Multi-Disciplinary Care Plan Details Patient Name: Date of Service: Cheryl [name], [name] 01/10/2020 2:30 PM Medical Record Number: [id number] Patient Account Number: [id number] Date of Birth/Sex: Treating RN: 1965/10/21 (55 y.o. [name], [name] Primary Care Ehtan Delfavero: [name] Other Clinician: Referring Mirinda Monte: Treating Erhard Senske/Extender: Tonette Bihari, [name] in Treatment: 19 Active Inactive Abuse / Safety / Falls / Self Care Management Nursing Diagnoses: Potential for falls Potential for injury related to falls Goals: Patient will not experience any injury related to falls Date Initiated: 08/30/2019 Target Resolution Date: 02/29/2020 Goal Status:  Active Patient/caregiver will verbalize/demonstrate measures taken to prevent injury and/or falls Date Initiated: 08/30/2019 Date Inactivated: 09/27/2019 Target Resolution Date: 09/28/2019 Goal Status: Met Interventions: Assess Activities of Daily Living upon admission and as needed Assess fall risk on admission and as needed Assess: immobility, friction, shearing, incontinence upon admission and as needed Assess impairment of mobility on admission and as needed per policy Assess personal safety and home safety (as indicated) on admission and as needed Assess self care needs on admission and as needed Provide education on fall prevention Provide education on personal and home safety Notes: Nutrition Nursing Diagnoses: Impaired glucose control: actual or potential Potential for alteratiion in Nutrition/Potential for imbalanced nutrition Goals: Patient/caregiver agrees to and verbalizes understanding of need to use nutritional supplements and/or vitamins as prescribed Date Initiated: 08/30/2019 Date Inactivated: 10/11/2019 Target Resolution Date: 10/05/2019 Goal Status: Met Patient/caregiver will maintain therapeutic glucose control Date Initiated: 08/30/2019 Target Resolution Date: 02/01/2020 Goal Status: Active Interventions: Assess HgA1c results as ordered upon admission and as needed Assess patient nutrition upon admission and as needed per policy Provide education on elevated blood sugars and impact on wound healing Provide education on nutrition Treatment Activities: Education provided on Nutrition : 09/11/2019 Notes: Wound/Skin Impairment Nursing Diagnoses: Impaired tissue integrity Knowledge deficit related to ulceration/compromised skin integrity Goals: Patient/caregiver will verbalize understanding of skin care regimen Date Initiated: 08/30/2019 Target Resolution Date: 02/01/2020 Goal Status: Active Interventions: Assess patient/caregiver  ability to obtain necessary  supplies Assess patient/caregiver ability to perform ulcer/skin care regimen upon admission and as needed Assess ulceration(s) every visit Provide education on ulcer and skin care Notes: Electronic Signature(s) Signed: 01/10/2020 6:28:52 PM By: Deon Pilling Entered By: Deon Pilling on 01/10/2020 15:37:17 -------------------------------------------------------------------------------- Pain Assessment Details Patient Name: Date of Service: [name], [name] 01/10/2020 2:30 PM Medical Record Number: [id number] Patient Account Number: [id number] Date of Birth/Sex: Treating RN: 07/11/1965 (55 y.o. Nancy Fetter Primary Care Mekiyah Gladwell: [name] Other Clinician: Referring Jessika Rothery: Treating Leeloo Silverthorne/Extender: Tonette Bihari, [name] in Treatment: 19 Active Problems Location of Pain Severity and Description of Pain Patient Has Paino No Site Locations Pain Management and Medication Current Pain Management: Electronic Signature(s) Signed: 01/14/2020 5:04:19 PM By: Levan Hurst RN, [name] Entered By: Levan Hurst on 01/10/2020 14:53:10 -------------------------------------------------------------------------------- Patient/Caregiver Education Details Patient Name: Date of Service: [name] 1/6/2022andnbsp2:30 PM Medical Record Number: [id number] Patient Account Number: [id number] Date of Birth/Gender: Treating RN: May 07, 1965 (55 y.o. [name] Primary Care Physician: [name] Other Clinician: Referring Physician: Treating Physician/Extender: Sonda Primes in Treatment: 19 Education Assessment Education Provided To: Patient Education Topics Provided Elevated Blood Sugar/ Impact on Healing: Methods: Explain/Verbal Responses: Reinforcements needed Electronic Signature(s) Signed: 01/10/2020 6:28:52 PM By: Deon Pilling Entered By: Deon Pilling on 01/10/2020  15:37:36 -------------------------------------------------------------------------------- Wound Assessment Details Patient Name: Date of Service: Cheryl [name], [name] 01/10/2020 2:30 PM Medical Record Number: [id number] Patient Account Number: [id number] Date of Birth/Sex: Treating RN: Apr 01, 1965 (55 y.o. Nancy Fetter Primary Care Arabell Neria: [name] Other Clinician: Referring Jody Silas: Treating [place] Nonaka/Extender: Tonette Bihari, [name] in Treatment: 19 Wound Status Wound Number: 1 Primary Diabetic Wound/Ulcer of the Lower Extremity Etiology: Wound Location: Left, Proximal, Plantar Foot Wound Open Wounding Event: Blister Status: Date Acquired: 08/02/2019 Comorbid Cataracts, Anemia, Congestive Heart Failure, Hypertension, Type Weeks Of Treatment: 19 History: II Diabetes, Osteomyelitis, Neuropathy Clustered Wound: No Wound Measurements Length: (cm) 1 Width: (cm) 3.2 Depth: (cm) 0.6 Area: (cm) 2.513 Volume: (cm) 1.508 % Reduction in Area: 42.9% % Reduction in Volume: 85.7% Epithelialization: Small (1-33%) Tunneling: No Undermining: Yes Starting Position (o'clock): 3 Ending Position (o'clock): 5 Maximum Distance: (cm) 0.8 Wound Description Classification: Grade 2 Wound Margin: Well defined, not attached Exudate Amount: Medium Exudate Type: Serosanguineous Exudate Color: red, brown Foul Odor After Cleansing: No Slough/Fibrino Yes Wound Bed Granulation Amount: Medium (34-66%) Exposed Structure Granulation Quality: Pink Fascia Exposed: No Necrotic Amount: Medium (34-66%) Fat Layer (Subcutaneous Tissue) Exposed: Yes Necrotic Quality: Adherent Slough Tendon Exposed: No Muscle Exposed: No Joint Exposed: No Bone Exposed: No Treatment Notes Wound #1 (Foot) Wound Laterality: Plantar, Left, Proximal Cleanser Peri-Wound Care Sween Lotion (Moisturizing lotion) Discharge Instruction: Apply moisturizing lotion or equivalent moisterizing lotion to  foot and leg with dressing changes Topical Primary Dressing Promogran Prisma Matrix, 4.34 (sq in) (silver collagen) Discharge Instruction: Moisten collagen with saline or hydrogel Secondary Dressing Woven Gauze Sponge, Non-Sterile 4x4 in Discharge Instruction: Apply over primary dressing as directed. ABD Pad, 5x9 Discharge Instruction: Apply over primary dressing as directed. Optifoam Non-Adhesive Dressing, 4x4 in Discharge Instruction: Foam to first met head for protection Secured With Compression Wrap Kerlix Roll 4.5x3.1 (in/yd) Discharge Instruction: Apply Kerlix and Coban compression as directed. Coban Self-Adherent Wrap 4x5 (in/yd) Discharge Instruction: Apply over Kerlix as directed. Compression Stockings Add-Ons Electronic Signature(s) Signed: 01/14/2020 5:04:19 PM By: Levan Hurst RN, [name] Entered By: Levan Hurst on 01/10/2020 15:04:26 -------------------------------------------------------------------------------- Wound Assessment Details Patient Name: Date of  Service: Cheryl [name], [name] 01/10/2020 2:30 PM Medical Record Number: [id number] Patient Account Number: [id number] Date of Birth/Sex: Treating RN: 17-Apr-1965 (55 y.o. Nancy Fetter Primary Care Wiletta Bermingham: [name] Other Clinician: Referring Cambell Rickenbach: Treating Obera Stauch/Extender: Tonette Bihari, [name] in Treatment: 19 Wound Status Wound Number: 2 Primary Diabetic Wound/Ulcer of the Lower Extremity Etiology: Wound Location: Left, Distal, Plantar Foot Wound Open Wounding Event: Blister Status: Date Acquired: 08/02/2019 Comorbid Cataracts, Anemia, Congestive Heart Failure, Hypertension, Type Weeks Of Treatment: 19 History: II Diabetes, Osteomyelitis, Neuropathy Clustered Wound: No Wound Measurements Length: (cm) 0.7 Width: (cm) 1.5 Depth: (cm) 0.4 Area: (cm) 0.825 Volume: (cm) 0.33 % Reduction in Area: 65% % Reduction in Volume: 86% Epithelialization: None Tunneling:  No Undermining: No Wound Description Classification: Grade 2 Wound Margin: Thickened Exudate Amount: Medium Exudate Type: Serosanguineous Exudate Color: red, brown Foul Odor After Cleansing: No Slough/Fibrino Yes Wound Bed Granulation Amount: Medium (34-66%) Exposed Structure Granulation Quality: Pink Fascia Exposed: No Necrotic Amount: Medium (34-66%) Fat Layer (Subcutaneous Tissue) Exposed: Yes Necrotic Quality: Adherent Slough Tendon Exposed: No Muscle Exposed: No Joint Exposed: No Bone Exposed: No Treatment Notes Wound #2 (Foot) Wound Laterality: Plantar, Left, Distal Cleanser Peri-Wound Care Sween Lotion (Moisturizing lotion) Discharge Instruction: Apply moisturizing lotion or equivalent moisterizing lotion to foot and leg with dressing changes Topical Primary Dressing Promogran Prisma Matrix, 4.34 (sq in) (silver collagen) Discharge Instruction: Moisten collagen with saline or hydrogel Secondary Dressing Woven Gauze Sponge, Non-Sterile 4x4 in Discharge Instruction: Apply over primary dressing as directed. ABD Pad, 5x9 Discharge Instruction: Apply over primary dressing as directed. Optifoam Non-Adhesive Dressing, 4x4 in Discharge Instruction: Foam to first met head for protection Secured With Compression Wrap Kerlix Roll 4.5x3.1 (in/yd) Discharge Instruction: Apply Kerlix and Coban compression as directed. Coban Self-Adherent Wrap 4x5 (in/yd) Discharge Instruction: Apply over Kerlix as directed. Compression Stockings Add-Ons Electronic Signature(s) Signed: 01/14/2020 5:04:19 PM By: Levan Hurst RN, [name] Entered By: Levan Hurst on 01/10/2020 15:05:42 -------------------------------------------------------------------------------- Cornelia Details Patient Name: Date of Service: [name] 01/10/2020 2:30 PM Medical Record Number: [id number] Patient Account Number: [id number] Date of Birth/Sex: Treating RN: November 17, 1965 (55 y.o. Nancy Fetter Primary Care Keean Wilmeth: [name] Other Clinician: Referring Emile Kyllo: Treating Brannen Koppen/Extender: Tonette Bihari, [name] in Treatment: 19 Vital Signs Time Taken: 14:52 Temperature (F): 98.4 Height (in): 67 Pulse (bpm): 91 Weight (lbs): 200 Respiratory Rate (breaths/min): 16 Body Mass Index (BMI): 31.3 Blood Pressure (mmHg): 162/91 Reference Range: 80 - 120 mg / dl Electronic Signature(s) Signed: 01/14/2020 5:04:19 PM By: Levan Hurst RN, [name] Entered By: Levan Hurst on 01/10/2020 14:53:05

## 2020-01-24 ENCOUNTER — Encounter (HOSPITAL_BASED_OUTPATIENT_CLINIC_OR_DEPARTMENT_OTHER): Payer: Medicare Other | Admitting: Internal Medicine

## 2020-01-28 ENCOUNTER — Telehealth: Payer: Self-pay | Admitting: Nurse Practitioner

## 2020-01-28 ENCOUNTER — Telehealth: Payer: Self-pay

## 2020-01-28 ENCOUNTER — Other Ambulatory Visit: Payer: Self-pay | Admitting: Nurse Practitioner

## 2020-01-28 MED ORDER — ROSUVASTATIN CALCIUM 20 MG PO TABS: 20.0000 mg | ORAL_TABLET | Freq: Every day | ORAL | 3 refills | Status: DC

## 2020-01-28 MED ORDER — METOPROLOL SUCCINATE ER 50 MG PO TB24: 50.0000 mg | ORAL_TABLET | Freq: Every day | ORAL | 3 refills | Status: DC

## 2020-01-28 MED ORDER — AMLODIPINE BESYLATE 5 MG PO TABS: 5.0000 mg | ORAL_TABLET | Freq: Every day | ORAL | 3 refills | Status: DC

## 2020-01-28 MED ORDER — ISOSORBIDE MONONITRATE ER 30 MG PO TB24: 30.0000 mg | ORAL_TABLET | Freq: Every day | ORAL | 3 refills | Status: DC

## 2020-01-28 MED ORDER — GABAPENTIN 400 MG PO CAPS: 400.0000 mg | ORAL_CAPSULE | Freq: Two times a day (BID) | ORAL | 3 refills | Status: DC

## 2020-01-28 MED ORDER — METFORMIN HCL 500 MG PO TABS: 500.0000 mg | ORAL_TABLET | Freq: Two times a day (BID) | ORAL | 3 refills | Status: DC

## 2020-01-28 NOTE — Telephone Encounter (Signed)
Maralyn Sago, RN returned call - [facility] is able to draw labs at patient's home since she is an established patient receiving nursing care.   Verbal orders from MD for CBC with diff and CMP obtained.  Maralyn Sago, RN notified.  Labs will be drawn 1/26 and faxed once final.

## 2020-01-28 NOTE — Telephone Encounter (Signed)
Pt called to inquire if labs can be drawn by her home health nurse to check her platelet counts.    Pt is utilizing [facility] - Per pt the nurse verbalized they can draw labs and drop off at clinic.    RN left voicemail with patient's home health nurse, Maralyn Sago, at [phone].  Awaiting call back to coordinate.

## 2020-01-28 NOTE — Telephone Encounter (Signed)
sent 

## 2020-01-31 ENCOUNTER — Other Ambulatory Visit: Payer: Self-pay

## 2020-01-31 ENCOUNTER — Encounter (HOSPITAL_BASED_OUTPATIENT_CLINIC_OR_DEPARTMENT_OTHER): Payer: Medicare Other | Admitting: Internal Medicine

## 2020-01-31 DIAGNOSIS — L97523 Non-pressure chronic ulcer of other part of left foot with necrosis of muscle: Secondary | ICD-10-CM | POA: Diagnosis not present

## 2020-01-31 DIAGNOSIS — L97522 Non-pressure chronic ulcer of other part of left foot with fat layer exposed: Secondary | ICD-10-CM | POA: Diagnosis not present

## 2020-01-31 DIAGNOSIS — E1169 Type 2 diabetes mellitus with other specified complication: Secondary | ICD-10-CM | POA: Diagnosis not present

## 2020-01-31 DIAGNOSIS — M86672 Other chronic osteomyelitis, left ankle and foot: Secondary | ICD-10-CM | POA: Diagnosis not present

## 2020-01-31 DIAGNOSIS — Z89511 Acquired absence of right leg below knee: Secondary | ICD-10-CM | POA: Diagnosis not present

## 2020-01-31 DIAGNOSIS — E11621 Type 2 diabetes mellitus with foot ulcer: Secondary | ICD-10-CM | POA: Diagnosis not present

## 2020-01-31 NOTE — Progress Notes (Signed)
[name], [name] ([id number]) Visit Report for 01/31/2020 Debridement Details Patient Name: Date of Service: MA ARYKA, COONRADT 01/31/2020 3:15 PM Medical Record Number: [id number] Patient Account Number: [id number] Date of Birth/Sex: Treating RN: 05/25/1965 (55 y.o. [name], Meta.Reding Primary Care Provider: [name] Other Clinician: Referring Provider: Treating Provider/Extender: Tonette Bihari, [name] in Treatment: 22 Debridement Performed for Assessment: Wound #1 Left,Proximal,Plantar Foot Performed By: Physician Ricard Dillon., MD Debridement Type: Debridement Severity of Tissue Pre Debridement: Fat layer exposed Level of Consciousness (Pre-procedure): Awake and Alert Pre-procedure Verification/Time Out Yes - 16:20 Taken: Start Time: 16:21 Pain Control: Lidocaine 4% T opical Solution T Area Debrided (L x W): otal 1 (cm) x 3 (cm) = 3 (cm) Tissue and other material debrided: Viable, Non-Viable, Callus, Slough, Subcutaneous, Skin: Dermis , Skin: Epidermis, Fibrin/Exudate, Slough Level: Skin/Subcutaneous Tissue Debridement Description: Excisional Instrument: Curette Bleeding: Minimum Hemostasis Achieved: Pressure End Time: 16:24 Procedural Pain: 0 Post Procedural Pain: 0 Response to Treatment: Procedure was tolerated well Level of Consciousness (Post- Awake and Alert procedure): Post Debridement Measurements of Total Wound Length: (cm) 0.6 Width: (cm) 2.7 Depth: (cm) 0.3 Volume: (cm) 0.382 Character of Wound/Ulcer Post Debridement: Improved Severity of Tissue Post Debridement: Fat layer exposed Post Procedure Diagnosis Same as Pre-procedure Electronic Signature(s) Signed: 01/31/2020 4:56:03 PM By: Linton Ham MD Signed: 01/31/2020 5:42:01 PM By: Deon Pilling Entered By: Linton Ham on 01/31/2020 16:42:39 -------------------------------------------------------------------------------- Debridement Details Patient Name: Date of  Service: MA DECARLA, SIEMEN 01/31/2020 3:15 PM Medical Record Number: [id number] Patient Account Number: [id number] Date of Birth/Sex: Treating RN: 23-May-1965 (55 y.o. [name], [name] Primary Care Provider: [name] Other Clinician: Referring Provider: Treating Provider/Extender: Tonette Bihari, [name] in Treatment: 22 Debridement Performed for Assessment: Wound #2 Left,Distal,Plantar Foot Performed By: Physician Ricard Dillon., MD Debridement Type: Debridement Severity of Tissue Pre Debridement: Limited to breakdown of skin Level of Consciousness (Pre-procedure): Awake and Alert Pre-procedure Verification/Time Out Yes - 16:20 Taken: Start Time: 16:21 Pain Control: Lidocaine 4% T opical Solution T Area Debrided (L x W): otal 0.5 (cm) x 0.5 (cm) = 0.25 (cm) Tissue and other material debrided: Non-Viable, Callus Level: Non-Viable Tissue Debridement Description: Selective/Open Wound Instrument: Curette Bleeding: Minimum Hemostasis Achieved: Pressure End Time: 16:24 Procedural Pain: 0 Post Procedural Pain: 0 Response to Treatment: Procedure was tolerated well Level of Consciousness (Post- Awake and Alert procedure): Post Debridement Measurements of Total Wound Length: (cm) 0.5 Width: (cm) 0.5 Depth: (cm) 0.1 Volume: (cm) 0.02 Character of Wound/Ulcer Post Debridement: Improved Severity of Tissue Post Debridement: Limited to breakdown of skin Post Procedure Diagnosis Same as Pre-procedure Electronic Signature(s) Signed: 01/31/2020 4:56:03 PM By: Linton Ham MD Signed: 01/31/2020 5:42:01 PM By: Deon Pilling Entered By: Linton Ham on 01/31/2020 16:42:49 -------------------------------------------------------------------------------- HPI Details Patient Name: Date of Service: MA AILINE, HEFFERAN 01/31/2020 3:15 PM Medical Record Number: [id number] Patient Account Number: [id number] Date of Birth/Sex: Treating RN: [birth year] (55 y.o. [name] Primary Care Provider: [name] Other Clinician: Referring Provider: Treating Provider/Extender: Tonette Bihari, [name] in Treatment: 22 History of Present Illness HPI Description: 28ADMISSION 08/30/2019 This is a 55 year old woman with type 2 diabetes and peripheral neuropathy. She developed a blister on the plantar left foot on 7/27. She was seen in the emergency room treated. This did not get any better and she required admission to the hospital from 08/02/2019 through 08/09/2019. She was felt to have cellulitis of the left foot. She was treated with IV medications/antibiotics discharged  on Augmentin however she could not tolerate this and she was changed to doxycycline for 5 days she has completed this. She has advanced home care they are apparently putting Xeroform over the wound. When she left the hospital she was supposed to be putting Santyl over the black areas on the left plantar foot covered with Xeroform. X-rays done in the hospital did not show evidence of osteomyelitis only a superficial plantar ulceration. She had blood cultures but I do not see a wound culture. The patient has a past medical history of a right below-knee amputation by Dr. [name] in 2012 presumably for infection in this area although I am not certain. As noted she is a type II diabetic on oral agents with a recent hemoglobin A1c of 5.5. Other past medical history includes ITP, peripheral edema, chronic kidney disease stage III, hypertension, right below-knee amputation, diastolic heart failure. She tells me she is wheelchair-bound and is nonweightbearing although she does push the wheelchair along with the left foot. She is only using hospital sock on the left foot. ABI in our clinic was 1.06 on the left 9/7; the patient cultured Pseudomonas last week out of the deeper proximal wound. MRI that was ordered showed progressive soft tissue ulceration along the plantar aspect of the foot especially  along the medial arch but also plantar to the first metatarsal phalangeal joint no drainable abscess or foreign body. There was a suspicion for early osteomyelitis in the fifth metatarsal base but she does not have a wound in this area. Also problematically is that where we put her prescription for ciprofloxacin and they did not have the antibiotic and we verified that they are out of ciprofloxacin. We did not receive the message from them to this effect The patient did see Dr. [name] of podiatry. The wound was debrided. He prescribed Santyl which I do not think she is picked up either. Lab work including a CBC sedimentation rate and C-reactive protein were ordered. Finally she is using silver alginate she has home health changing the dressing 3 times a week she does not disturb this in between times 9/23; we have not seen this lady in 2-1/2 weeks largely because of transportation issues. She still has 3 wound areas a deep area in the midfoot and area just above this and then an area on the first met head. She has been using silver alginate on the wound. They are trying to use home health however they are trying to teach her how to do this at be been very difficult for this patient to do so. In discussing things with her she uses this foot to propel her wheelchair I have asked her to stop doing this and to use her arms. She states that she is only moving around in her house. We will need to have a look at her lab work that I quoted from the last time she was here that was ordered by Dr. [name] of podiatry. By our MRI she does not have osteomyelitis in any of the wounded areas. 10/7 2-week follow-up. The patient has 3 wounds including superficially over the left first metatarsal head, left first submetatarsal in the lateral foot and a deep area more distally. Her MRI did not show osteomyelitis in reference to the wounds that we are treating although did suggest an early possibility in the left  fifth metatarsal head however there is no wound in this area. I have reviewed her inflammatory markers. Most recently on 7/26 she had a  sedimentation rate of 117 although this was previously greater than 140 and then 108. Last CRP was on 8/20 at 5.1 comparing to previously at 4.27. Noteworthy that this is when she was in the hospital in July I believe. 10/19; patient missed her appointment last week because of transportation issues. We have been using silver collagen. She still has the area over the metatarsal head and the mid part of her foot which are superficial wounds about the same. Marked deterioration in the problematic distal wound. This now is right down into the fascial layer of her foot. Our intake nurse noted purulent drainage. She has a small satellite lesion towards the heel 10/28; PCR culture of the major wound she has on her proximal plantar foot grew both Peptostreptococcus and Corynebacterium. The Corynebacterium could easily be a skin contaminant but Peptostreptococcus deserve treatment and I started her on Augmentin she arrives in clinic today with the wound looking a lot better 11/5; Finishing her augmentin. Patient's foot is not doing well. Now with a probing area laterally of the major wound going laterally. she presents with a new wound on the lateral part of the left foot with purulent drainage. this tunnels widely and toward the plantar foot. It does not connect with the major plantar wound however I am concerned 11/12; Wound on the left lateral foot that had some purulent drainage last week I cultured and that was negative although she was still on Augmentin at that time which may have affected the culture. Other than that she has the same 3 areas on the plantar part of the left foot including one over the third metatarsal head, a deep area proximally which has always been her largest wound and one in between. The one in the third metatarsal head and one just distal to this  of always been superficial where she has a deep wound in the proximal part of the foot. Today the lateral part of the deeper wound is not nearly ass easy to probe this had a considerable distance last week I told the patient that really the approach this is going to depend on the result of the MRI which is on Monday. If she has an underlying abscess and/or osteomyelitis she will need serious consideration of amputation in this patient who is nonambulatory plus or minus foot surgery and IV antibiotics. We really have not made a lot of progress with her original wounds. 12/3; surprisingly the patient's MRI did not show osteomyelitis but an extensive cellulitis soft tissue infection. With some difficulty 10 days ago we managed to get her on Levaquin and Flagyl. The patient arrives back in clinic today with her 3 original wounds. The area on the lateral foot has closed. 12/17; 2-week follow-up. She is completing her Levaquin should have another 2 days. No additional antibiotics are felt to be necessary. The tunneling area to the lateral part of her foot is closed over. Of the 3 wounds we have been dealing with the area over the met head is fully epithelialized. The middle area it is still eschared and the major distal area is heavily surrounded by thick callus 1/6; she completed her antibiotics has not been here in 3 weeks however. We have been using silver alginate. 2 remaining wounds on the plantar foot she is a diabetic. She does not have arterial issues. MRI did not show osteomyelitis but extensive cellulitis and soft tissue infection. 1/27; 3-week follow-up. She has home health who called [place] to report that her medications were  mixed up she apparently has rectified the situation. We have been using silver collagen to the 2 remaining areas on her foot Electronic Signature(s) Signed: 01/31/2020 4:56:03 PM By: Linton Ham MD Entered By: Linton Ham on 01/31/2020  16:44:45 -------------------------------------------------------------------------------- Physical Exam Details Patient Name: Date of Service: MA DESIRA, ALESSANDRINI 01/31/2020 3:15 PM Medical Record Number: [id number] Patient Account Number: [id number] Date of Birth/Sex: Treating RN: February 10, 1965 (55 y.o. [name] Primary Care Provider: [name] Other Clinician: Referring Provider: Treating Provider/Extender: Tonette Bihari, [name] in Treatment: 22 Constitutional Patient is hypertensive.. Pulse regular and within target range for patient.Marland Kitchen Respirations regular, non-labored and within target range.. Temperature is normal and within the target range for the patient.Marland Kitchen Appears in no distress. Notes Wound exam; Third metatarsal head wound remains closed. The middle one in her midfoot had callus I remove this there is still a small open area The proximal wound which was her largest at 1 point has certainly contracted nicely still very senescent thick edges which I removed with a #5 curette and debris on the surface. Hemostasis with direct pressure Electronic Signature(s) Signed: 01/31/2020 4:56:03 PM By: Linton Ham MD Entered By: Linton Ham on 01/31/2020 16:46:10 -------------------------------------------------------------------------------- Physician Orders Details Patient Name: Date of Service: MA ELNA, RADOVICH 01/31/2020 3:15 PM Medical Record Number: [id number] Patient Account Number: [id number] Date of Birth/Sex: Treating RN: 1965-06-20 (55 y.o. [name], Meta.Reding Primary Care Provider: [name] Other Clinician: Referring Provider: Treating Provider/Extender: Tonette Bihari, [name] in Treatment: 22 Verbal / Phone Orders: No Diagnosis Coding ICD-10 Coding Code Description E11.621 Type 2 diabetes mellitus with foot ulcer L97.523 Non-pressure chronic ulcer of other part of left foot with necrosis of muscle Z89.511 Acquired  absence of right leg below knee M86.672 Other chronic osteomyelitis, left ankle and foot Follow-up Appointments Return Appointment in 2 weeks. Cellular or Tissue Based Products Wound #1 Left,Proximal,Plantar Foot Cellular or Tissue Based Product Type: - wound center to run insurance approval for dermagraft. Wound #2 Left,Distal,Plantar Foot Cellular or Tissue Based Product Type: - wound center to run insurance approval for dermagraft. Bathing/ Shower/ Hygiene May shower with protection but do not get wound dressing(s) wet. Edema Control - Lymphedema / SCD / Other Left Lower Extremity Elevate legs to the level of the heart or above for 30 minutes daily and/or when sitting, a frequency of: - throughout the day Off-Loading Other: - minimal weight bearing left foot, try not to navigate wheelchair with left foot [place] wound care orders this week; continue Home Health for wound care. May utilize formulary equivalent dressing for wound treatment orders unless otherwise specified. - [facility] Wound Treatment Wound #1 - Foot Wound Laterality: Plantar, Left, Proximal Peri-Wound Care: Sween Lotion (Moisturizing lotion) (Home Health) 3 x Per Week/30 Days Discharge Instructions: Apply moisturizing lotion or equivalent moisterizing lotion to foot and leg with dressing changes Prim Dressing: Promogran Prisma Matrix, 4.34 (sq in) (silver collagen) (Home Health) 3 x Per Week/30 Days ary Discharge Instructions: Moisten collagen with saline or hydrogel Secondary Dressing: Woven Gauze Sponge, Non-Sterile 4x4 in (Home Health) 3 x Per Week/30 Days Discharge Instructions: Apply over primary dressing as directed. Secondary Dressing: ABD Pad, 5x9 (Home Health) 3 x Per Week/30 Days Discharge Instructions: Apply over primary dressing as directed. Secondary Dressing: Optifoam Non-Adhesive Dressing, 4x4 in (Home Health) 3 x Per Week/30 Days Discharge Instructions: Foam to first met head for  protection Compression Wrap: Kerlix Roll 4.5x3.1 (in/yd) (Home Health) 3 x Per  Week/30 Days Discharge Instructions: Apply Kerlix and Coban compression as directed. Compression Wrap: Coban Self-Adherent Wrap 4x5 (in/yd) (Home Health) 3 x Per Week/30 Days Discharge Instructions: Apply over Kerlix as directed. Wound #2 - Foot Wound Laterality: Plantar, Left, Distal Peri-Wound Care: Sween Lotion (Moisturizing lotion) (Home Health) 3 x Per Week/30 Days Discharge Instructions: Apply moisturizing lotion or equivalent moisterizing lotion to foot and leg with dressing changes Prim Dressing: Promogran Prisma Matrix, 4.34 (sq in) (silver collagen) (Home Health) 3 x Per Week/30 Days ary Discharge Instructions: Moisten collagen with saline or hydrogel Secondary Dressing: Woven Gauze Sponge, Non-Sterile 4x4 in (Home Health) 3 x Per Week/30 Days Discharge Instructions: Apply over primary dressing as directed. Secondary Dressing: ABD Pad, 5x9 (Home Health) 3 x Per Week/30 Days Discharge Instructions: Apply over primary dressing as directed. Secondary Dressing: Optifoam Non-Adhesive Dressing, 4x4 in (Home Health) 3 x Per Week/30 Days Discharge Instructions: Foam to first met head for protection Compression Wrap: Kerlix Roll 4.5x3.1 (in/yd) (Home Health) 3 x Per Week/30 Days Discharge Instructions: Apply Kerlix and Coban compression as directed. Compression Wrap: Coban Self-Adherent Wrap 4x5 (in/yd) (Home Health) 3 x Per Week/30 Days Discharge Instructions: Apply over Kerlix as directed. Electronic Signature(s) Signed: 01/31/2020 4:56:03 PM By: Linton Ham MD Signed: 01/31/2020 5:42:01 PM By: Deon Pilling Entered By: Deon Pilling on 01/31/2020 16:26:05 -------------------------------------------------------------------------------- Problem List Details Patient Name: Date of Service: MA LEKITA, KEREKES 01/31/2020 3:15 PM Medical Record Number: [id number] Patient Account Number: [id number] Date of  Birth/Sex: Treating RN: 01-May-1965 (54 y.o. [name] Primary Care Provider: [name] Other Clinician: Referring Provider: Treating Provider/Extender: Tonette Bihari, [name] in Treatment: 22 Active Problems ICD-10 Encounter Code Description Active Date MDM Diagnosis E11.621 Type 2 diabetes mellitus with foot ulcer 08/30/2019 No Yes L97.523 Non-pressure chronic ulcer of other part of left foot with necrosis of muscle 08/30/2019 No Yes Z89.511 Acquired absence of right leg below knee 08/30/2019 No Yes M86.672 Other chronic osteomyelitis, left ankle and foot 08/30/2019 No Yes Inactive Problems Resolved Problems Electronic Signature(s) Signed: 01/31/2020 4:56:03 PM By: Linton Ham MD Signed: 01/31/2020 4:56:03 PM By: Linton Ham MD Entered By: Linton Ham on 01/31/2020 16:42:17 -------------------------------------------------------------------------------- Progress Note Details Patient Name: Date of Service: MA JENELLE, DRENNON 01/31/2020 3:15 PM Medical Record Number: [id number] Patient Account Number: [id number] Date of Birth/Sex: Treating RN: Feb 02, 1965 (55 y.o. [name] Primary Care Provider: [name] Other Clinician: Referring Provider: Treating Provider/Extender: Tonette Bihari, [name] in Treatment: 22 Subjective History of Present Illness (HPI) 28ADMISSION 08/30/2019 This is a 55 year old woman with type 2 diabetes and peripheral neuropathy. She developed a blister on the plantar left foot on 7/27. She was seen in the emergency room treated. This did not get any better and she required admission to the hospital from 08/02/2019 through 08/09/2019. She was felt to have cellulitis of the left foot. She was treated with IV medications/antibiotics discharged on Augmentin however she could not tolerate this and she was changed to doxycycline for 5 days she has completed this. She has advanced home care they are apparently  putting Xeroform over the wound. When she left the hospital she was supposed to be putting Santyl over the black areas on the left plantar foot covered with Xeroform. X-rays done in the hospital did not show evidence of osteomyelitis only a superficial plantar ulceration. She had blood cultures but I do not see a wound culture. The patient has a past medical history of a right below-knee amputation by  Dr. [name] in 2012 presumably for infection in this area although I am not certain. As noted she is a type II diabetic on oral agents with a recent hemoglobin A1c of 5.5. Other past medical history includes ITP, peripheral edema, chronic kidney disease stage III, hypertension, right below-knee amputation, diastolic heart failure. She tells me she is wheelchair-bound and is nonweightbearing although she does push the wheelchair along with the left foot. She is only using hospital sock on the left foot. ABI in our clinic was 1.06 on the left 9/7; the patient cultured Pseudomonas last week out of the deeper proximal wound. MRI that was ordered showed progressive soft tissue ulceration along the plantar aspect of the foot especially along the medial arch but also plantar to the first metatarsal phalangeal joint no drainable abscess or foreign body. There was a suspicion for early osteomyelitis in the fifth metatarsal base but she does not have a wound in this area. Also problematically is that where we put her prescription for ciprofloxacin and they did not have the antibiotic and we verified that they are out of ciprofloxacin. We did not receive the message from them to this effect The patient did see Dr. [name] of podiatry. The wound was debrided. He prescribed Santyl which I do not think she is picked up either. Lab work including a CBC sedimentation rate and C-reactive protein were ordered. Finally she is using silver alginate she has home health changing the dressing 3 times a week she does not  disturb this in between times 9/23; we have not seen this lady in 2-1/2 weeks largely because of transportation issues. She still has 3 wound areas a deep area in the midfoot and area just above this and then an area on the first met head. She has been using silver alginate on the wound. They are trying to use home health however they are trying to teach her how to do this at be been very difficult for this patient to do so. In discussing things with her she uses this foot to propel her wheelchair I have asked her to stop doing this and to use her arms. She states that she is only moving around in her house. We will need to have a look at her lab work that I quoted from the last time she was here that was ordered by Dr. [name] of podiatry. By our MRI she does not have osteomyelitis in any of the wounded areas. 10/7 2-week follow-up. The patient has 3 wounds including superficially over the left first metatarsal head, left first submetatarsal in the lateral foot and a deep area more distally. Her MRI did not show osteomyelitis in reference to the wounds that we are treating although did suggest an early possibility in the left fifth metatarsal head however there is no wound in this area. I have reviewed her inflammatory markers. Most recently on 7/26 she had a sedimentation rate of 117 although this was previously greater than 140 and then 108. Last CRP was on 8/20 at 5.1 comparing to previously at 4.27. Noteworthy that this is when she was in the hospital in July I believe. 10/19; patient missed her appointment last week because of transportation issues. We have been using silver collagen. She still has the area over the metatarsal head and the mid part of her foot which are superficial wounds about the same. Marked deterioration in the problematic distal wound. This now is right down into the fascial layer of her foot. Our  intake nurse noted purulent drainage. She has a small satellite lesion  towards the heel 10/28; PCR culture of the major wound she has on her proximal plantar foot grew both Peptostreptococcus and Corynebacterium. The Corynebacterium could easily be a skin contaminant but Peptostreptococcus deserve treatment and I started her on Augmentin she arrives in clinic today with the wound looking a lot better 11/5; Finishing her augmentin. Patient's foot is not doing well. Now with a probing area laterally of the major wound going laterally. she presents with a new wound on the lateral part of the left foot with purulent drainage. this tunnels widely and toward the plantar foot. It does not connect with the major plantar wound however I am concerned 11/12; Wound on the left lateral foot that had some purulent drainage last week I cultured and that was negative although she was still on Augmentin at that time which may have affected the culture. Other than that she has the same 3 areas on the plantar part of the left foot including one over the third metatarsal head, a deep area proximally which has always been her largest wound and one in between. The one in the third metatarsal head and one just distal to this of always been superficial where she has a deep wound in the proximal part of the foot. Today the lateral part of the deeper wound is not nearly ass easy to probe this had a considerable distance last week I told the patient that really the approach this is going to depend on the result of the MRI which is on Monday. If she has an underlying abscess and/or osteomyelitis she will need serious consideration of amputation in this patient who is nonambulatory plus or minus foot surgery and IV antibiotics. We really have not made a lot of progress with her original wounds. 12/3; surprisingly the patient's MRI did not show osteomyelitis but an extensive cellulitis soft tissue infection. With some difficulty 10 days ago we managed to get her on Levaquin and Flagyl. The patient  arrives back in clinic today with her 3 original wounds. The area on the lateral foot has closed. 12/17; 2-week follow-up. She is completing her Levaquin should have another 2 days. No additional antibiotics are felt to be necessary. The tunneling area to the lateral part of her foot is closed over. Of the 3 wounds we have been dealing with the area over the met head is fully epithelialized. The middle area it is still eschared and the major distal area is heavily surrounded by thick callus 1/6; she completed her antibiotics has not been here in 3 weeks however. We have been using silver alginate. 2 remaining wounds on the plantar foot she is a diabetic. She does not have arterial issues. MRI did not show osteomyelitis but extensive cellulitis and soft tissue infection. 1/27; 3-week follow-up. She has home health who called [place] to report that her medications were mixed up she apparently has rectified the situation. We have been using silver collagen to the 2 remaining areas on her foot Objective Constitutional Patient is hypertensive.. Pulse regular and within target range for patient.Marland Kitchen Respirations regular, non-labored and within target range.. Temperature is normal and within the target range for the patient.Marland Kitchen Appears in no distress. Vitals Time Taken: 3:59 PM, Height: 67 in, Weight: 200 lbs, BMI: 31.3, Temperature: 97.6 F, Pulse: 80 bpm, Respiratory Rate: 16 breaths/min, Blood Pressure: 166/81 mmHg. General Notes: Wound exam; ooThird metatarsal head wound remains closed. The middle one in her  midfoot had callus I remove this there is still a small open area ooThe proximal wound which was her largest at 1 point has certainly contracted nicely still very senescent thick edges which I removed with a #5 curette and debris on the surface. Hemostasis with direct pressure Integumentary (Hair, Skin) Wound #1 status is Open. Original cause of wound was Blister. The wound is located on the  Left,Proximal,Plantar Foot. The wound measures 0.6cm length x 2.7cm width x 0.3cm depth; 1.272cm^2 area and 0.382cm^3 volume. There is Fat Layer (Subcutaneous Tissue) exposed. There is no tunneling or undermining noted. There is a medium amount of serosanguineous drainage noted. The wound margin is well defined and not attached to the wound base. There is large (67- 100%) pink granulation within the wound bed. There is a small (1-33%) amount of necrotic tissue within the wound bed including Adherent Slough. Wound #2 status is Open. Original cause of wound was Blister. The wound is located on the [place]. The wound measures 0.5cm length x 0.5cm width x 0.1cm depth; 0.196cm^2 area and 0.02cm^3 volume. There is Fat Layer (Subcutaneous Tissue) exposed. There is no tunneling or undermining noted. There is a small amount of serosanguineous drainage noted. The wound margin is thickened. There is no granulation within the wound bed. There is no necrotic tissue within the wound bed. General Notes: callous noted over wound. 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 There was a Excisional Skin/Subcutaneous Tissue Debridement with a total area of 3 sq cm performed by Ricard Dillon., MD. With the following instrument(s): Curette to remove Viable and Non-Viable tissue/material. Material removed includes Callus, Subcutaneous Tissue, Slough, Skin: Dermis, Skin: Epidermis, and Fibrin/Exudate after achieving pain control using Lidocaine 4% T opical Solution. A time out was conducted at  16:20, prior to the start of the procedure. A Minimum amount of bleeding was controlled with Pressure. The procedure was tolerated well with a pain level of 0 throughout and a pain level of 0 following the procedure. Post Debridement Measurements: 0.6cm length x 2.7cm width x 0.3cm depth; 0.382cm^3 volume. Character of Wound/Ulcer Post Debridement is improved. Severity of Tissue Post Debridement is: Fat layer exposed. Post procedure Diagnosis Wound #1: Same as Pre-Procedure Wound #2 Pre-procedure diagnosis of Wound #2 is a Diabetic Wound/Ulcer of the Lower Extremity located on the Left,Distal,Plantar Foot .Severity of Tissue Pre Debridement is: Limited to breakdown of skin. There was a Selective/Open Wound Non-Viable Tissue Debridement with a total area of 0.25 sq cm performed by Ricard Dillon., MD. With the following instrument(s): Curette to remove Non-Viable tissue/material. Material removed includes Callus after achieving pain control using Lidocaine 4% Topical Solution. A time out was conducted at 16:20, prior to the start of the procedure. A Minimum amount of bleeding was controlled with Pressure. The procedure was tolerated well with a pain level of 0 throughout and a pain level of 0 following the procedure. Post Debridement Measurements: 0.5cm length x 0.5cm width x 0.1cm depth; 0.02cm^3 volume. Character of Wound/Ulcer Post Debridement is improved. Severity of Tissue Post Debridement is: Limited to breakdown of skin. Post procedure Diagnosis Wound #2: Same as Pre-Procedure Plan  Follow-up Appointments: Return Appointment in 2 weeks. Cellular or Tissue Based Products: Wound #1 Left,Proximal,Plantar Foot: Cellular or Tissue Based Product Type: - wound center to run insurance approval for dermagraft. Wound #2 Left,Distal,Plantar Foot: Cellular or Tissue Based Product Type: - wound center to run insurance approval for dermagraft. Bathing/ Shower/ Hygiene: May shower with protection but  do not get wound dressing(s) wet. Edema Control - Lymphedema / SCD / Other: Elevate legs to the level of the heart or above for 30 minutes daily and/or when sitting, a frequency of: - throughout the day Off-Loading: Other: - minimal weight bearing left foot, try not to navigate wheelchair with left foot Home Health: New wound care orders this week; continue Home Health for wound care. May utilize formulary equivalent dressing for wound treatment orders unless otherwise specified. - [facility] WOUND #1: - Foot Wound Laterality: Plantar, Left, Proximal Peri-Wound Care: Sween Lotion (Moisturizing lotion) (Home Health) 3 x Per Week/30 Days Discharge Instructions: Apply moisturizing lotion or equivalent moisterizing lotion to foot and leg with dressing changes Prim Dressing: Promogran Prisma Matrix, 4.34 (sq in) (silver collagen) ([place]) 3 x Per Week/30 Days ary Discharge Instructions: Moisten collagen with saline or hydrogel Secondary Dressing: Woven Gauze Sponge, Non-Sterile 4x4 in (Home Health) 3 x Per Week/30 Days Discharge Instructions: Apply over primary dressing as directed. Secondary Dressing: ABD Pad, 5x9 (Home Health) 3 x Per Week/30 Days Discharge Instructions: Apply over primary dressing as directed. Secondary Dressing: Optifoam Non-Adhesive Dressing, 4x4 in (Home Health) 3 x Per Week/30 Days Discharge Instructions: Foam to first met head for protection Com pression Wrap: Kerlix Roll 4.5x3.1 (in/yd) (Home Health) 3 x Per Week/30 Days Discharge Instructions: Apply Kerlix and Coban compression as directed. Com pression Wrap: Coban Self-Adherent Wrap 4x5 (in/yd) (Home Health) 3 x Per Week/30 Days Discharge Instructions: Apply over Kerlix as directed. WOUND #2: - Foot Wound Laterality: Plantar, Left, Distal Peri-Wound Care: Sween Lotion (Moisturizing lotion) (Home Health) 3 x Per Week/30 Days Discharge Instructions: Apply moisturizing lotion or equivalent moisterizing  lotion to foot and leg with dressing changes Prim Dressing: Promogran Prisma Matrix, 4.34 (sq in) (silver collagen) (Home Health) 3 x Per Week/30 Days ary Discharge Instructions: Moisten collagen with saline or hydrogel Secondary Dressing: Woven Gauze Sponge, Non-Sterile 4x4 in (Home Health) 3 x Per Week/30 Days Discharge Instructions: Apply over primary dressing as directed. Secondary Dressing: ABD Pad, 5x9 (Home Health) 3 x Per Week/30 Days Discharge Instructions: Apply over primary dressing as directed. Secondary Dressing: Optifoam Non-Adhesive Dressing, 4x4 in (Home Health) 3 x Per Week/30 Days Discharge Instructions: Foam to first met head for protection Com pression Wrap: Kerlix Roll 4.5x3.1 (in/yd) (Home Health) 3 x Per Week/30 Days Discharge Instructions: Apply Kerlix and Coban compression as directed. Com pression Wrap: Coban Self-Adherent Wrap 4x5 (in/yd) (Home Health) 3 x Per Week/30 Days Discharge Instructions: Apply over Kerlix as directed. 1. We are still using silver collagen. The patient has made substantial improvements in these wounds. 2 when I see the original middle wound in the midfoot next 2 week I hope its closed 3. She has an unacceptable co-pay for Dermagraft Electronic Signature(s) Signed: 01/31/2020 4:56:03 PM By: Linton Ham MD Entered By: Linton Ham on 01/31/2020 16:47:36 -------------------------------------------------------------------------------- SuperBill Details Patient Name: Date of Service: MA GLESSIE, EUSTICE 01/31/2020 Medical Record Number: [id number] Patient Account Number: [id number] Date of Birth/Sex: Treating RN: 23-Dec-1965 (55 y.o. [name] Primary Care Provider: [name] Other Clinician: Referring Provider: Treating Provider/Extender: Tonette Bihari, Crystal  Weeks in Treatment: 22 Diagnosis Coding ICD-10 Codes Code Description E11.621 Type 2 diabetes mellitus with foot ulcer L97.523 Non-pressure chronic ulcer  of other part of left foot with necrosis of muscle Z89.511 Acquired absence of right leg below knee M86.672 Other chronic osteomyelitis, left ankle and foot Facility Procedures CPT4 Code: [id number] Description: [place] - DEB SUBQ TISSUE 20 SQ CM/< ICD-10 Diagnosis Description L97.523 Non-pressure chronic ulcer of other part of left foot with necrosis of muscle Modifier: Quantity: 1 CPT4 Code: [id number] Description: 82417 - DEBRIDE WOUND 1ST 20 SQ CM OR < ICD-10 Diagnosis Description L97.523 Non-pressure chronic ulcer of other part of left foot with necrosis of muscle Modifier: 59 Quantity: 1 Physician Procedures : CPT4 Code Description Modifier [id number] - WC PHYS SUBQ TISS 20 SQ CM ICD-10 Diagnosis Description L97.523 Non-pressure chronic ulcer of other part of left foot with necrosis of muscle Quantity: 1 : [id number] - WC PHYS DEBR WO ANESTH 20 SQ CM 59 ICD-10 Diagnosis Description L97.523 Non-pressure chronic ulcer of other part of left foot with necrosis of muscle Quantity: 1 Electronic Signature(s) Signed: 01/31/2020 4:56:03 PM By: Linton Ham MD Entered By: Linton Ham on 01/31/2020 16:48:19

## 2020-02-01 DIAGNOSIS — D696 Thrombocytopenia, unspecified: Secondary | ICD-10-CM | POA: Diagnosis not present

## 2020-02-01 DIAGNOSIS — E1159 Type 2 diabetes mellitus with other circulatory complications: Secondary | ICD-10-CM | POA: Diagnosis not present

## 2020-02-01 DIAGNOSIS — R233 Spontaneous ecchymoses: Secondary | ICD-10-CM | POA: Diagnosis not present

## 2020-02-01 NOTE — Progress Notes (Signed)
[name], [name] ([id number]) Visit Report for 01/31/2020 Arrival Information Details Patient Name: Date of Service: [name], [name] 01/31/2020 3:15 PM Medical Record Number: [id number] Patient Account Number: [id number] Date of Birth/Sex: Treating RN: [birth year] (55 y.o. [name], Meta.Reding Primary Care Raimi Guillermo: [name] Other Clinician: Referring Kasem Mozer: Treating Cordelro Gautreau/Extender: Tonette Bihari, [name] in Treatment: 22 Visit Information History Since Last Visit Added or deleted any medications: No Patient Arrived: Wheel Chair Any new allergies or adverse reactions: No Arrival Time: 15:57 Had a fall or experienced change in No Accompanied By: self activities of daily living that may affect Transfer Assistance: None risk of falls: Patient Identification Verified: Yes Signs or symptoms of abuse/neglect since last visito No Secondary Verification Process Completed: Yes Hospitalized since last visit: No Patient Requires Transmission-Based Precautions: No Implantable device outside of the clinic excluding No Patient Has Alerts: No cellular tissue based products placed in the center since last visit: Has Dressing in Place as Prescribed: Yes Pain Present Now: No Electronic Signature(s) Signed: 02/01/2020 10:25:52 AM By: Sandre Kitty Entered By: Sandre Kitty on 01/31/2020 15:59:08 -------------------------------------------------------------------------------- Encounter Discharge Information Details Patient Name: Date of Service: [name], [name] 01/31/2020 3:15 PM Medical Record Number: [id number] Patient Account Number: [id number] Date of Birth/Sex: Treating RN: [birth year] (55 y.o. Nancy Fetter Primary Care Chlora Mcbain: [name] Other Clinician: Referring Annisten Manchester: Treating Hannia Matchett/Extender: Tonette Bihari, [name] in Treatment: 22 Encounter Discharge Information Items Post Procedure Vitals Discharge Condition:  Stable Temperature (F): 97.6 Ambulatory Status: Wheelchair Pulse (bpm): 80 Discharge Destination: Home Respiratory Rate (breaths/min): 16 Transportation: Private Auto Blood Pressure (mmHg): 166/81 Accompanied By: alone Schedule Follow-up Appointment: Yes Clinical Summary of Care: Patient Declined Electronic Signature(s) Signed: 01/31/2020 5:48:06 PM By: Levan Hurst RN, [name] Entered By: Levan Hurst on 01/31/2020 17:02:21 -------------------------------------------------------------------------------- Lower Extremity Assessment Details Patient Name: Date of Service: [name], [name] 01/31/2020 3:15 PM Medical Record Number: [id number] Patient Account Number: [id number] Date of Birth/Sex: Treating RN: 03/19/1965 (55 y.o. [name] Primary Care Sanayah Munro: [name] Other Clinician: Referring Savanah Bayles: Treating Braydn Carneiro/Extender: Tonette Bihari, [name] in Treatment: 22 Edema Assessment Assessed: [Left: No] [Right: No] Edema: [Left: N] [Right: o] Calf Left: Right: Point of Measurement: From Medial Instep 34 cm Ankle Left: Right: Point of Measurement: From Medial Instep 22 cm Vascular Assessment Pulses: Dorsalis Pedis Palpable: [Left:Yes] Electronic Signature(s) Signed: 01/31/2020 5:42:01 PM By: Deon Pilling Entered By: Deon Pilling on 01/31/2020 16:12:54 -------------------------------------------------------------------------------- Multi Wound Chart Details Patient Name: Date of Service: [name] 01/31/2020 3:15 PM Medical Record Number: [id number] Patient Account Number: [id number] Date of Birth/Sex: Treating RN: 25-Nov-1965 (55 y.o. [name], Meta.Reding Primary Care Cherry Wittwer: [name] Other Clinician: Referring Zya Finkle: Treating Riki Gehring/Extender: Tonette Bihari, [name] in Treatment: 22 Vital Signs Height(in): 57 Pulse(bpm): 30 Weight(lbs): 200 Blood Pressure(mmHg): 166/81 Body Mass Index(BMI):  31 Temperature(F): 97.6 Respiratory Rate(breaths/min): 16 Photos: [1:No Photos Left, Proximal, Plantar Foot] [2:No Photos Left, Distal, Plantar Foot] [N/A:N/A N/A] Wound Location: [1:Blister] [2:Blister] [N/A:N/A] Wounding Event: [1:Diabetic Wound/Ulcer of the Lower] [2:Diabetic Wound/Ulcer of the Lower] [N/A:N/A] Primary Etiology: [1:Extremity Cataracts, Anemia, Congestive Heart] [2:Extremity Cataracts, Anemia, Congestive Heart] [N/A:N/A] Comorbid History: [1:Failure, Hypertension, Type II Diabetes, Osteomyelitis, Neuropathy 08/02/2019] [2:Failure, Hypertension, Type II Diabetes, Osteomyelitis, Neuropathy 08/02/2019] [N/A:N/A] Date Acquired: [1:22] [2:22] [N/A:N/A] Weeks of Treatment: [1:Open] [2:Open] [N/A:N/A] Wound Status: [1:0.6x2.7x0.3] [2:0.5x0.5x0.1] [N/A:N/A] Measurements L x W x D (cm) [1:1.272] [8:0.034] [N/A:N/A] A (cm) : rea [1:0.382] [2:0.02] [N/A:N/A] Volume (cm) : [1:71.10%] [2:91.70%] [  N/A:N/A] % Reduction in A rea: [1:96.40%] [2:99.20%] [N/A:N/A] % Reduction in Volume: [1:Grade 2] [2:Grade 2] [N/A:N/A] Classification: [1:Medium] [2:Small] [N/A:N/A] Exudate A mount: [1:Serosanguineous] [2:Serosanguineous] [N/A:N/A] Exudate Type: [1:red, brown] [2:red, brown] [N/A:N/A] Exudate Color: [1:Well defined, not attached] [2:Thickened] [N/A:N/A] Wound Margin: [1:Large (67-100%)] [2:None Present (0%)] [N/A:N/A] Granulation A mount: [1:Pink] [2:N/A] [N/A:N/A] Granulation Quality: [1:Small (1-33%)] [2:None Present (0%)] [N/A:N/A] Necrotic A mount: [1:Fat Layer (Subcutaneous Tissue): Yes Fat Layer (Subcutaneous Tissue): Yes N/A] Exposed Structures: [1:Fascia: No Tendon: No Muscle: No Joint: No Bone: No Medium (34-66%)] [2:Fascia: No Tendon: No Muscle: No Joint: No Bone: No None] [N/A:N/A] Epithelialization: [1:Debridement - Excisional] [2:Debridement - Selective/Open Wound N/A] Debridement: Pre-procedure Verification/Time Out 16:20 [2:16:20] [N/A:N/A] Taken: [1:Lidocaine 4%  Topical Solution] [2:Lidocaine 4% Topical Solution] [N/A:N/A] Pain Control: [1:Callus, Subcutaneous, Slough] [2:Callus] [N/A:N/A] Tissue Debrided: [1:Skin/Subcutaneous Tissue] [2:Non-Viable Tissue] [N/A:N/A] Level: [1:3] [2:0.25] [N/A:N/A] Debridement A (sq cm): [1:rea Curette] [2:Curette] [N/A:N/A] Instrument: [1:Minimum] [2:Minimum] [N/A:N/A] Bleeding: [1:Pressure] [2:Pressure] [N/A:N/A] Hemostasis A chieved: [1:0] [2:0] [N/A:N/A] Procedural Pain: [1:0] [2:0] [N/A:N/A] Post Procedural Pain: [1:Procedure was tolerated well] [2:Procedure was tolerated well] [N/A:N/A] Debridement Treatment Response: [1:0.6x2.7x0.3] [2:0.5x0.5x0.1] [N/A:N/A] Post Debridement Measurements L x W x D (cm) [1:0.382] [2:0.02] [N/A:N/A] Post Debridement Volume: (cm) [1:N/A] [2:callous noted over wound.] [N/A:N/A] Assessment Notes: [1:Debridement] [2:Debridement] [N/A:N/A] Treatment Notes Electronic Signature(s) Signed: 01/31/2020 4:56:03 PM By: Linton Ham MD Signed: 01/31/2020 5:42:01 PM By: Deon Pilling Entered By: Linton Ham on 01/31/2020 16:42:27 -------------------------------------------------------------------------------- Multi-Disciplinary Care Plan Details Patient Name: Date of Service: Cheryl IQRA, ROTUNDO 01/31/2020 3:15 PM Medical Record Number: [id number] Patient Account Number: [id number] Date of Birth/Sex: Treating RN: Nov 08, 1965 (55 y.o. [name], [name] Primary Care Arlanda Shiplett: [name] Other Clinician: Referring Sari Cogan: Treating Rome Echavarria/Extender: Tonette Bihari, [name] in Treatment: 22 Active Inactive Abuse / Safety / Falls / Self Care Management Nursing Diagnoses: Potential for falls Potential for injury related to falls Goals: Patient will not experience any injury related to falls Date Initiated: 08/30/2019 Target Resolution Date: 02/29/2020 Goal Status: Active Patient/caregiver will verbalize/demonstrate measures taken to prevent injury and/or  falls Date Initiated: 08/30/2019 Date Inactivated: 09/27/2019 Target Resolution Date: 09/28/2019 Goal Status: Met Interventions: Assess Activities of Daily Living upon admission and as needed Assess fall risk on admission and as needed Assess: immobility, friction, shearing, incontinence upon admission and as needed Assess impairment of mobility on admission and as needed per policy Assess personal safety and home safety (as indicated) on admission and as needed Assess self care needs on admission and as needed Provide education on fall prevention Provide education on personal and home safety Notes: Nutrition Nursing Diagnoses: Impaired glucose control: actual or potential Potential for alteratiion in Nutrition/Potential for imbalanced nutrition Goals: Patient/caregiver agrees to and verbalizes understanding of need to use nutritional supplements and/or vitamins as prescribed Date Initiated: 08/30/2019 Date Inactivated: 10/11/2019 Target Resolution Date: 10/05/2019 Goal Status: Met Patient/caregiver will maintain therapeutic glucose control Date Initiated: 08/30/2019 Target Resolution Date: 02/29/2020 Goal Status: Active Interventions: Assess HgA1c results as ordered upon admission and as needed Assess patient nutrition upon admission and as needed per policy Provide education on elevated blood sugars and impact on wound healing Provide education on nutrition Treatment Activities: Education provided on Nutrition : 08/30/2019 Notes: Wound/Skin Impairment Nursing Diagnoses: Impaired tissue integrity Knowledge deficit related to ulceration/compromised skin integrity Goals: Patient/caregiver will verbalize understanding of skin care regimen Date Initiated: 08/30/2019 Target Resolution Date: 03/07/2020 Goal Status: Active Interventions: Assess patient/caregiver ability to obtain necessary supplies Assess patient/caregiver ability to perform ulcer/skin care  regimen upon admission and as  needed Assess ulceration(s) every visit Provide education on ulcer and skin care Notes: Electronic Signature(s) Signed: 01/31/2020 5:42:01 PM By: Deon Pilling Entered By: Deon Pilling on 01/31/2020 16:16:04 -------------------------------------------------------------------------------- Pain Assessment Details Patient Name: Date of Service: [name], [name] 01/31/2020 3:15 PM Medical Record Number: [id number] Patient Account Number: [id number] Date of Birth/Sex: Treating RN: 28-Apr-1965 (55 y.o. [name] Primary Care Kawana Hegel: Other Clinician: [name] Referring Jem Castro: Treating Kaidan Spengler/Extender: Tonette Bihari, [name] in Treatment: 22 Active Problems Location of Pain Severity and Description of Pain Patient Has Paino No Site Locations Pain Management and Medication Current Pain Management: Electronic Signature(s) Signed: 01/31/2020 5:42:01 PM By: Deon Pilling Signed: 02/01/2020 10:25:52 AM By: Sandre Kitty Entered By: Sandre Kitty on 01/31/2020 15:59:37 -------------------------------------------------------------------------------- Patient/Caregiver Education Details Patient Name: Date of Service: [name] 1/27/2022andnbsp3:15 PM Medical Record Number: [id number] Patient Account Number: [id number] Date of Birth/Gender: Treating RN: [birth year] (55 y.o. [name] Primary Care Physician: [name] Other Clinician: Referring Physician: Treating Physician/Extender: Sonda Primes in Treatment: 22 Education Assessment Education Provided To: Patient Education Topics Provided Elevated Blood Sugar/ Impact on Healing: Handouts: Elevated Blood Sugars: How Do They Affect Wound Healing Methods: Explain/Verbal Responses: Reinforcements needed Electronic Signature(s) Signed: 01/31/2020 5:42:01 PM By: Deon Pilling Entered By: Deon Pilling on 01/31/2020  16:16:16 -------------------------------------------------------------------------------- Wound Assessment Details Patient Name: Date of Service: [name], [name] 01/31/2020 3:15 PM Medical Record Number: [id number] Patient Account Number: [id number] Date of Birth/Sex: Treating RN: Aug 21, 1965 (55 y.o. [name], Meta.Reding Primary Care Minela Bridgewater: [name] Other Clinician: Referring Katalaya Beel: Treating Lucero Ide/Extender: Tonette Bihari, [name] in Treatment: 22 Wound Status Wound Number: 1 Primary Diabetic Wound/Ulcer of the Lower Extremity Etiology: Wound Location: Left, Proximal, Plantar Foot Wound Open Wounding Event: Blister Status: Date Acquired: 08/02/2019 Comorbid Cataracts, Anemia, Congestive Heart Failure, Hypertension, Type Weeks Of Treatment: 22 History: II Diabetes, Osteomyelitis, Neuropathy Clustered Wound: No Wound Measurements Length: (cm) 0.6 Width: (cm) 2.7 Depth: (cm) 0.3 Area: (cm) 1.272 Volume: (cm) 0.382 % Reduction in Area: 71.1% % Reduction in Volume: 96.4% Epithelialization: Medium (34-66%) Tunneling: No Undermining: No Wound Description Classification: Grade 2 Wound Margin: Well defined, not attached Exudate Amount: Medium Exudate Type: Serosanguineous Exudate Color: red, brown Foul Odor After Cleansing: No Slough/Fibrino Yes Wound Bed Granulation Amount: Large (67-100%) Exposed Structure Granulation Quality: Pink Fascia Exposed: No Necrotic Amount: Small (1-33%) Fat Layer (Subcutaneous Tissue) Exposed: Yes Necrotic Quality: Adherent Slough Tendon Exposed: No Muscle Exposed: No Joint Exposed: No Bone Exposed: No Treatment Notes Wound #1 (Foot) Wound Laterality: Plantar, Left, Proximal Cleanser Peri-Wound Care Sween Lotion (Moisturizing lotion) Discharge Instruction: Apply moisturizing lotion or equivalent moisterizing lotion to foot and leg with dressing changes Topical Primary Dressing Promogran Prisma Matrix, 4.34  (sq in) (silver collagen) Discharge Instruction: Moisten collagen with saline or hydrogel Secondary Dressing Woven Gauze Sponge, Non-Sterile 4x4 in Discharge Instruction: Apply over primary dressing as directed. ABD Pad, 5x9 Discharge Instruction: Apply over primary dressing as directed. Optifoam Non-Adhesive Dressing, 4x4 in Discharge Instruction: Foam to first met head for protection Secured With Compression Wrap Kerlix Roll 4.5x3.1 (in/yd) Discharge Instruction: Apply Kerlix and Coban compression as directed. Coban Self-Adherent Wrap 4x5 (in/yd) Discharge Instruction: Apply over Kerlix as directed. Compression Stockings Add-Ons Electronic Signature(s) Signed: 01/31/2020 5:42:01 PM By: Deon Pilling Entered By: Deon Pilling on 01/31/2020 16:14:23 -------------------------------------------------------------------------------- Wound Assessment Details Patient Name: Date of Service: [name], [name] 01/31/2020 3:15 PM Medical Record Number:  [id number] Patient Account Number: [id number] Date of Birth/Sex: Treating RN: 09-Mar-1965 (55 y.o. [name], [name] Primary Care Lavergne Hiltunen: [name] Other Clinician: Referring Amyre Segundo: Treating Shyler Hamill/Extender: Tonette Bihari, [name] in Treatment: 22 Wound Status Wound Number: 2 Primary Diabetic Wound/Ulcer of the Lower Extremity Etiology: Wound Location: Left, Distal, Plantar Foot Wound Open Wounding Event: Blister Status: Date Acquired: 08/02/2019 Comorbid Cataracts, Anemia, Congestive Heart Failure, Hypertension, Type Weeks Of Treatment: 22 History: II Diabetes, Osteomyelitis, Neuropathy Clustered Wound: No Wound Measurements Length: (cm) 0.5 Width: (cm) 0.5 Depth: (cm) 0.1 Area: (cm) 0.196 Volume: (cm) 0.02 % Reduction in Area: 91.7% % Reduction in Volume: 99.2% Epithelialization: None Tunneling: No Undermining: No Wound Description Classification: Grade 2 Wound Margin: Thickened Exudate Amount:  Small Exudate Type: Serosanguineous Exudate Color: red, brown Foul Odor After Cleansing: No Slough/Fibrino Yes Wound Bed Granulation Amount: None Present (0%) Exposed Structure Necrotic Amount: None Present (0%) Fascia Exposed: No Fat Layer (Subcutaneous Tissue) Exposed: Yes Tendon Exposed: No Muscle Exposed: No Joint Exposed: No Bone Exposed: No Assessment Notes callous noted over wound. Treatment Notes Wound #2 (Foot) Wound Laterality: Plantar, Left, Distal Cleanser Peri-Wound Care Sween Lotion (Moisturizing lotion) Discharge Instruction: Apply moisturizing lotion or equivalent moisterizing lotion to foot and leg with dressing changes Topical Primary Dressing Promogran Prisma Matrix, 4.34 (sq in) (silver collagen) Discharge Instruction: Moisten collagen with saline or hydrogel Secondary Dressing Woven Gauze Sponge, Non-Sterile 4x4 in Discharge Instruction: Apply over primary dressing as directed. ABD Pad, 5x9 Discharge Instruction: Apply over primary dressing as directed. Optifoam Non-Adhesive Dressing, 4x4 in Discharge Instruction: Foam to first met head for protection Secured With Compression Wrap Kerlix Roll 4.5x3.1 (in/yd) Discharge Instruction: Apply Kerlix and Coban compression as directed. Coban Self-Adherent Wrap 4x5 (in/yd) Discharge Instruction: Apply over Kerlix as directed. Compression Stockings Add-Ons Electronic Signature(s) Signed: 01/31/2020 5:42:01 PM By: Deon Pilling Entered By: Deon Pilling on 01/31/2020 16:23:19 -------------------------------------------------------------------------------- Vitals Details Patient Name: Date of Service: [name], [name] 01/31/2020 3:15 PM Medical Record Number: [id number] Patient Account Number: [id number] Date of Birth/Sex: Treating RN: 1965/12/08 (55 y.o. [name], [name] Primary Care Krystiana Fornes: [name] Other Clinician: Referring Nyeisha Goodall: Treating Alyria Krack/Extender: Tonette Bihari,  [name] in Treatment: 22 Vital Signs Time Taken: 15:59 Temperature (F): 97.6 Height (in): 67 Pulse (bpm): 80 Weight (lbs): 200 Respiratory Rate (breaths/min): 16 Body Mass Index (BMI): 31.3 Blood Pressure (mmHg): 166/81 Reference Range: 80 - 120 mg / dl Electronic Signature(s) Signed: 02/01/2020 10:25:52 AM By: Sandre Kitty Entered By: Sandre Kitty on 01/31/2020 15:59:30

## 2020-02-04 ENCOUNTER — Other Ambulatory Visit: Payer: Self-pay

## 2020-02-04 ENCOUNTER — Telehealth: Payer: Self-pay

## 2020-02-04 ENCOUNTER — Telehealth: Payer: Self-pay | Admitting: Hematology and Oncology

## 2020-02-04 ENCOUNTER — Other Ambulatory Visit: Payer: Self-pay | Admitting: Hematology and Oncology

## 2020-02-04 NOTE — Telephone Encounter (Signed)
RN placed call to patient's [facility] nurse, Maralyn Sago.  Voicemail left for return call, [id number].

## 2020-02-04 NOTE — Progress Notes (Signed)
RN spoke with patient and informed her of CBC results after MD reviewed.  MD recommendations for two doses of IVIG - Orders placed by MD.  High priority scheduling message sent.  Pt verbalized understanding and agreement.

## 2020-02-04 NOTE — Telephone Encounter (Signed)
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 Previously she was noncompliant with Rituxan infusions. The only thing that works university for her is IVIG. Therefore I will set her up for 2 doses of IVIG.

## 2020-02-04 NOTE — Telephone Encounter (Signed)
RN placed call to patient regarding labs and MD recommendations.  Voicemail left requesting call back.

## 2020-02-05 ENCOUNTER — Encounter: Payer: Self-pay | Admitting: *Deleted

## 2020-02-05 NOTE — Progress Notes (Signed)
Pt scheduled for IVIG tx on 2/3 and 2/4.  Per MD pt needing labs to be drawn one week after receiving IVIG to monitor CBC and CMP.  RN placed call to Maralyn Sago, RN with [facility] [phone]) and gave verbal orders for CBC and CMP to be drawn and faxed back to our office on 02/15/20 or 02/18/20 depending on when staff would be able to draw labs.  Maralyn Sago, RN verbalized understanding.

## 2020-02-06 ENCOUNTER — Encounter: Payer: Self-pay | Admitting: *Deleted

## 2020-02-06 ENCOUNTER — Telehealth: Payer: Self-pay

## 2020-02-06 NOTE — Telephone Encounter (Signed)
Called Mrs. Duggin about her appointments and I was able to fit her in on 2/7 and 2/8 at 730 am.  She is aware of her appointments and I explained why we were trying to send her to [facility] but she has special needs and only can come here. Lorayne Marek, RN

## 2020-02-06 NOTE — Telephone Encounter (Signed)
----- Message from Cheryl Pole, RN sent at 02/06/2020  1:32 PM EST ----- Regarding: RE: add on If you can call her to let her know that would be great.  She was not happy at all when she spoke with me and thinks we were not trying our best to get her scheduled.  Maybe she will be okay with everything after speaking with our AD.  Thanks Shawna Orleans!  [name]  ----- Message ----- From: Cheryl Marek, RN Sent: 02/06/2020   1:29 PM EST To: Dellis Filbert, LPN, Sabino Snipes, RN, # Subject: RE: add on                                     [name] and all, I have scheduled her at 730 both 2/7 and 2/8.  That is the only option here and it pushes some overbooks as well in infusion.  Her treatment is 7 hours long which is why we didn't have the space.  She cannot come any later than 730 or she will have to be done in [facility].  I have nothing else open and I really didn't even have that open.  If you would rather me call her, I can.  Just let me know. [name] ----- Message ----- From: Cheryl Pole, RN Sent: 02/06/2020   1:02 PM EST To: Dellis Filbert, LPN, Sabino Snipes, RN, # Subject: RE: add on                                     I just got off the phone with her and she is extremely upset.  She kept saying "we need to work harder" and "there's too many excuses".  She has straight up refused [place] and wants to be scheduled here.  Haidan Nhan if you can try to give her a call as well.  I tried to explain that the next opening may not be until 2 weeks from now for her treatment and that's when she responded that we are not working hard enough on this.  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 When are we able to fit her in next?   Cheryl Mings, RN ----- Message ----- From: Cheryl Frisk, RN Sent: 02/06/2020  11:28 AM EST To: Cheryl Pole, RN, Cheryl Frisk, RN, # Subject: RE: add on                                     Due to length of treatment, we will not have time  for this in the near future.  Please encourage her to go to [facility] per [facility].    Thanks.  ----- Message ----- From: Cheryl Frisk, RN Sent: 02/06/2020   8:20 AM EST To: Cheryl Pole, RN, [name], # Subject: RE: add on                                     [name], 1200 is going to be too late.  By the look of her last infusion, this is going to be a 7 hr treatment.  It looks  like it took about 5 hrs, plus 30 minute post obs, plus 30 minutes nursing time, plus 30 minutes premeds, 30 minute wait after premeds.  Also, she's a hard stick.   ----- Message ----- From: Cheryl Marek, RN Sent: 02/05/2020   5:00 PM EST To: Cheryl Pole, RN, [name], # Subject: RE: add on                                     1200 on 2/7 and 1200 on 2/8. Cheryl Marek, RN  ----- Message ----- From: [name] Sent: 02/05/2020   4:43 PM EST To: Cheryl Pole, RN, [name], # Subject: RE: add on                                     Pt does not want to go to [place] location , would rather wait for next available here in  .    ----- Message ----- From: [name] Sent: 02/05/2020   2:09 PM EST To: [name] Subject: RE: add on                                     Sorry girl I haven't had a chance to call her.  Thanks Archie Wolf  ----- Message ----- From: [name] Sent: 02/05/2020   1:55 PM EST To: [name] Subject: RE: add on                                     Got it . Do I need to call pt or did you already call ?  ----- Message ----- From: [name] Sent: 02/05/2020  12:01 PM EST To: [name] Subject: RE: add on                                     Thursday & Friday both days @ 10:00AM   Archie Wolf  ----- Message ----- From: [name] Sent: 02/05/2020  11:08 AM EST To: [name] Subject: FW: add on                                     Did they send you a reply on this - next available -  this wk if you have it   ----- Message ----- From: [name] Sent: 02/05/2020   9:08 AM EST To: Cheryl Pole, RN, [name], # Subject: RE: add on                                     Add in [place] to reply  ----- Message ----- From: [name] Sent: 02/05/2020   9:06 AM EST To: [name] Subject: RE: add on  OK thanks.  Does she need 2 days this week for IVIG?  T  ----- Message ----- From: [name] Sent: 02/05/2020   8:45 AM EST To: [name] Subject: FW: add on                                      ----- Message ----- From: Cheryl Castle, RN Sent: 02/05/2020   8:44 AM EST To: Cheryl Pole, RN, [name] Subject: RE: add on                                     Yes, per [name] approval is good with insurance  ----- Message ----- From: [name] Sent: 02/05/2020   8:41 AM EST To: Cheryl Castle, RN Subject: FW: add on                                     Has this IVIG been approved ?  ----- Message ----- From: [name] Sent: 02/05/2020   8:11 AM EST To: [name] Subject: RE: add on                                     Has this been approved??  Thanks Archie Wolf  ----- Message ----- From: [name] Sent: 02/04/2020   4:26 PM EST To: [name] Subject: FW: add on                                     [name] , do yall have any openings over there ?  ----- Message ----- From: Cheryl Marek, RN Sent: 02/04/2020   4:19 PM EST To: Cheryl Frisk, RN, [name], # Subject: RE: add on                                     I would second [place] as well. No room here. [name] ----- Message ----- From: Cheryl Frisk, RN Sent: 02/04/2020   4:13 PM EST To: [name], Cheryl Marek, RN, # Subject: RE: add on                                     We are overbooked just about every day this week, it  would be an 7.5 hr treatment based off looking at how long her last treatment ran.   I'm not sure how we can fit two consecutive days of a 7.5hr treatment.  Is [facility] an option?   ----- Message ----- From: [name] Sent: 02/04/2020   1:57 PM EST To: [name], [facility] R[facility] Subject: add on                                          Scheduling Message Entered by [name] on  02/04/2020 at 1:49 PM Priority: High <No visit type provided> Department: [facility] Provider: Serena Croissant, MD Scheduling Notes: Patient needs to be scheduled for 2 days of IVIG, as soon as possible due to platelet count of 13.  Please contact patient to set up time and date - Thanks!   [name]   Next available ? And how long should this be ?

## 2020-02-06 NOTE — Progress Notes (Signed)
Pt scheduled for IVIG 02/11/20 and 02/12/20.  Pt will be due for lab re check on 02/19/20.  RN placed call to Maralyn Sago [facility] RN and LVM regarding scheduling pt to have CBC and CMP drawn on 02/19/20 with results faxed to our office.

## 2020-02-07 ENCOUNTER — Ambulatory Visit: Payer: Medicare Other

## 2020-02-08 ENCOUNTER — Ambulatory Visit: Payer: Medicare Other

## 2020-02-11 ENCOUNTER — Other Ambulatory Visit: Payer: Self-pay

## 2020-02-11 ENCOUNTER — Inpatient Hospital Stay: Payer: Medicare Other | Attending: Hematology and Oncology

## 2020-02-11 VITALS — BP 149/69 | HR 86 | Temp 98.5°F | Resp 18

## 2020-02-11 DIAGNOSIS — E876 Hypokalemia: Secondary | ICD-10-CM | POA: Insufficient documentation

## 2020-02-11 DIAGNOSIS — D693 Immune thrombocytopenic purpura: Secondary | ICD-10-CM | POA: Diagnosis not present

## 2020-02-11 DIAGNOSIS — E119 Type 2 diabetes mellitus without complications: Secondary | ICD-10-CM | POA: Insufficient documentation

## 2020-02-11 DIAGNOSIS — Z23 Encounter for immunization: Secondary | ICD-10-CM | POA: Insufficient documentation

## 2020-02-11 MED ORDER — IMMUNE GLOBULIN (HUMAN) 10 GM/100ML IV SOLN
95.0000 g | Freq: Once | INTRAVENOUS | Status: AC
  Administered 2020-02-11: 95 g via INTRAVENOUS
  Filled 2020-02-11: qty 800

## 2020-02-11 MED ORDER — DIPHENHYDRAMINE HCL 25 MG PO CAPS
ORAL_CAPSULE | ORAL | Status: AC
  Filled 2020-02-11: qty 1

## 2020-02-11 MED ORDER — ACETAMINOPHEN 325 MG PO TABS
650.0000 mg | ORAL_TABLET | Freq: Once | ORAL | Status: AC
  Administered 2020-02-11: 650 mg via ORAL

## 2020-02-11 MED ORDER — DEXTROSE 5 % IV SOLN
Freq: Once | INTRAVENOUS | Status: AC
  Filled 2020-02-11: qty 250

## 2020-02-11 MED ORDER — DIPHENHYDRAMINE HCL 25 MG PO TABS
25.0000 mg | ORAL_TABLET | Freq: Once | ORAL | Status: AC
  Administered 2020-02-11: 25 mg via ORAL
  Filled 2020-02-11: qty 1

## 2020-02-11 MED ORDER — ACETAMINOPHEN 325 MG PO TABS
ORAL_TABLET | ORAL | Status: AC
  Filled 2020-02-11: qty 2

## 2020-02-11 NOTE — Patient Instructions (Signed)
Immune Globulin Injection What is this medicine? IMMUNE GLOBULIN (im MUNE GLOB yoo lin) helps to prevent or reduce the severity of certain infections in patients who are at risk. This medicine is collected from the pooled blood of many donors. It is used to treat immune system problems, thrombocytopenia, and Kawasaki syndrome. This medicine may be used for other purposes; ask your health care provider or pharmacist if you have questions. COMMON BRAND NAME(S): ASCENIV, Baygam, BIVIGAM, Carimune, Carimune NF, cutaquig, Cuvitru, Flebogamma, Flebogamma DIF, GamaSTAN, GamaSTAN S/D, Gamimune N, Gammagard, Gammagard S/D, Gammaked, Gammaplex, Gammar-P IV, Gamunex, Gamunex-C, Hizentra, Iveegam, Iveegam EN, Octagam, Panglobulin, Panglobulin NF, panzyga, Polygam S/D, Privigen, Sandoglobulin, Venoglobulin-S, Vigam, Vivaglobulin, Xembify What should I tell my health care provider before I take this medicine? They need to know if you have any of these conditions:  diabetes  extremely low or no immune antibodies in the blood  heart disease  history of blood clots  hyperprolinemia  infection in the blood, sepsis  kidney disease  recently received or scheduled to receive a vaccination  an unusual or allergic reaction to human immune globulin, albumin, maltose, sucrose, other medicines, foods, dyes, or preservatives  pregnant or trying to get pregnant  breast-feeding How should I use this medicine? This medicine is for injection into a muscle or infusion into a vein or skin. It is usually given by a health care professional in a hospital or clinic setting. In rare cases, some brands of this medicine might be given at home. You will be taught how to give this medicine. Use exactly as directed. Take your medicine at regular intervals. Do not take your medicine more often than directed. Talk to your pediatrician regarding the use of this medicine in children. While this drug may be prescribed for selected  conditions, precautions do apply. Overdosage: If you think you have taken too much of this medicine contact a poison control center or emergency room at once. NOTE: This medicine is only for you. Do not share this medicine with others. What if I miss a dose? It is important not to miss your dose. Call your doctor or health care professional if you are unable to keep an appointment. If you give yourself the medicine and you miss a dose, take it as soon as you can. If it is almost time for your next dose, take only that dose. Do not take double or extra doses. What may interact with this medicine?  aspirin and aspirin-like medicines  cisplatin  cyclosporine  medicines for infection like acyclovir, adefovir, amphotericin B, bacitracin, cidofovir, foscarnet, ganciclovir, gentamicin, pentamidine, vancomycin  NSAIDS, medicines for pain and inflammation, like ibuprofen or naproxen  pamidronate  vaccines  zoledronic acid This list may not describe all possible interactions. Give your health care provider a list of all the medicines, herbs, non-prescription drugs, or dietary supplements you use. Also tell them if you smoke, drink alcohol, or use illegal drugs. Some items may interact with your medicine. What should I watch for while using this medicine? Your condition will be monitored carefully while you are receiving this medicine. This medicine is made from pooled blood donations of many different people. It may be possible to pass an infection in this medicine. However, the donors are screened for infections and all products are tested for HIV and hepatitis. The medicine is treated to kill most or all bacteria and viruses. Talk to your doctor about the risks and benefits of this medicine. Do not have vaccinations for at least   14 days before, or until at least 3 months after receiving this medicine. What side effects may I notice from receiving this medicine? Side effects that you should report  to your doctor or health care professional as soon as possible:  allergic reactions like skin rash, itching or hives, swelling of the face, lips, or tongue  blue colored lips or skin  breathing problems  chest pain or tightness  fever  signs and symptoms of aseptic meningitis such as stiff neck; sensitivity to light; headache; drowsiness; fever; nausea; vomiting; rash  signs and symptoms of a blood clot such as chest pain; shortness of breath; pain, swelling, or warmth in the leg  signs and symptoms of hemolytic anemia such as fast heartbeat; tiredness; dark yellow or brown urine; or yellowing of the eyes or skin  signs and symptoms of kidney injury like trouble passing urine or change in the amount of urine  sudden weight gain  swelling of the ankles, feet, hands Side effects that usually do not require medical attention (report to your doctor or health care professional if they continue or are bothersome):  diarrhea  flushing  headache  increased sweating  joint pain  muscle cramps  muscle pain  nausea  pain, redness, or irritation at site where injected  tiredness This list may not describe all possible side effects. Call your doctor for medical advice about side effects. You may report side effects to FDA at 1-800-FDA-1088. Where should I keep my medicine? Keep out of the reach of children. This drug is usually given in a hospital or clinic and will not be stored at home. In rare cases, some brands of this medicine may be given at home. If you are using this medicine at home, you will be instructed on how to store this medicine. Throw away any unused medicine after the expiration date on the label. NOTE: This sheet is a summary. It may not cover all possible information. If you have questions about this medicine, talk to your doctor, pharmacist, or health care provider.  2021 [employer] Standard (2018-07-26 12:51:14)  

## 2020-02-12 ENCOUNTER — Other Ambulatory Visit: Payer: Self-pay | Admitting: Hematology and Oncology

## 2020-02-12 ENCOUNTER — Other Ambulatory Visit: Payer: Self-pay

## 2020-02-12 ENCOUNTER — Inpatient Hospital Stay: Payer: Medicare Other

## 2020-02-12 ENCOUNTER — Encounter: Payer: Self-pay | Admitting: *Deleted

## 2020-02-12 VITALS — BP 123/60 | HR 82 | Temp 98.4°F | Resp 18

## 2020-02-12 DIAGNOSIS — Z23 Encounter for immunization: Secondary | ICD-10-CM

## 2020-02-12 DIAGNOSIS — D693 Immune thrombocytopenic purpura: Secondary | ICD-10-CM | POA: Diagnosis not present

## 2020-02-12 DIAGNOSIS — E876 Hypokalemia: Secondary | ICD-10-CM | POA: Diagnosis not present

## 2020-02-12 DIAGNOSIS — E119 Type 2 diabetes mellitus without complications: Secondary | ICD-10-CM | POA: Diagnosis not present

## 2020-02-12 MED ORDER — DIPHENHYDRAMINE HCL 25 MG PO CAPS
ORAL_CAPSULE | ORAL | Status: AC
  Filled 2020-02-12: qty 1

## 2020-02-12 MED ORDER — DEXTROSE 5 % IV SOLN
Freq: Once | INTRAVENOUS | Status: AC
  Filled 2020-02-12: qty 250

## 2020-02-12 MED ORDER — ACETAMINOPHEN 325 MG PO TABS
ORAL_TABLET | ORAL | Status: AC
  Filled 2020-02-12: qty 2

## 2020-02-12 MED ORDER — IMMUNE GLOBULIN (HUMAN) 20 GM/200ML IV SOLN
95.0000 g | Freq: Once | INTRAVENOUS | Status: AC
  Administered 2020-02-12: 95 g via INTRAVENOUS
  Filled 2020-02-12: qty 800

## 2020-02-12 MED ORDER — INFLUENZA VAC SPLIT QUAD 0.5 ML IM SUSY
0.5000 mL | PREFILLED_SYRINGE | Freq: Once | INTRAMUSCULAR | Status: AC
  Administered 2020-02-12: 0.5 mL via INTRAMUSCULAR

## 2020-02-12 MED ORDER — DIPHENHYDRAMINE HCL 25 MG PO TABS
25.0000 mg | ORAL_TABLET | Freq: Once | ORAL | Status: AC
  Administered 2020-02-12: 25 mg via ORAL
  Filled 2020-02-12: qty 1

## 2020-02-12 MED ORDER — ACETAMINOPHEN 325 MG PO TABS
650.0000 mg | ORAL_TABLET | Freq: Once | ORAL | Status: AC
  Administered 2020-02-12: 650 mg via ORAL

## 2020-02-12 NOTE — Patient Instructions (Signed)
Immune Globulin Injection What is this medicine? IMMUNE GLOBULIN (im MUNE GLOB yoo lin) helps to prevent or reduce the severity of certain infections in patients who are at risk. This medicine is collected from the pooled blood of many donors. It is used to treat immune system problems, thrombocytopenia, and Kawasaki syndrome. This medicine may be used for other purposes; ask your health care provider or pharmacist if you have questions. COMMON BRAND NAME(S): ASCENIV, Baygam, BIVIGAM, Carimune, Carimune NF, cutaquig, Cuvitru, Flebogamma, Flebogamma DIF, GamaSTAN, GamaSTAN S/D, Gamimune N, Gammagard, Gammagard S/D, Gammaked, Gammaplex, Gammar-P IV, Gamunex, Gamunex-C, Hizentra, Iveegam, Iveegam EN, Octagam, Panglobulin, Panglobulin NF, panzyga, Polygam S/D, Privigen, Sandoglobulin, Venoglobulin-S, Vigam, Vivaglobulin, Xembify What should I tell my health care provider before I take this medicine? They need to know if you have any of these conditions:  diabetes  extremely low or no immune antibodies in the blood  heart disease  history of blood clots  hyperprolinemia  infection in the blood, sepsis  kidney disease  recently received or scheduled to receive a vaccination  an unusual or allergic reaction to human immune globulin, albumin, maltose, sucrose, other medicines, foods, dyes, or preservatives  pregnant or trying to get pregnant  breast-feeding How should I use this medicine? This medicine is for injection into a muscle or infusion into a vein or skin. It is usually given by a health care professional in a hospital or clinic setting. In rare cases, some brands of this medicine might be given at home. You will be taught how to give this medicine. Use exactly as directed. Take your medicine at regular intervals. Do not take your medicine more often than directed. Talk to your pediatrician regarding the use of this medicine in children. While this drug may be prescribed for selected  conditions, precautions do apply. Overdosage: If you think you have taken too much of this medicine contact a poison control center or emergency room at once. NOTE: This medicine is only for you. Do not share this medicine with others. What if I miss a dose? It is important not to miss your dose. Call your doctor or health care professional if you are unable to keep an appointment. If you give yourself the medicine and you miss a dose, take it as soon as you can. If it is almost time for your next dose, take only that dose. Do not take double or extra doses. What may interact with this medicine?  aspirin and aspirin-like medicines  cisplatin  cyclosporine  medicines for infection like acyclovir, adefovir, amphotericin B, bacitracin, cidofovir, foscarnet, ganciclovir, gentamicin, pentamidine, vancomycin  NSAIDS, medicines for pain and inflammation, like ibuprofen or naproxen  pamidronate  vaccines  zoledronic acid This list may not describe all possible interactions. Give your health care provider a list of all the medicines, herbs, non-prescription drugs, or dietary supplements you use. Also tell them if you smoke, drink alcohol, or use illegal drugs. Some items may interact with your medicine. What should I watch for while using this medicine? Your condition will be monitored carefully while you are receiving this medicine. This medicine is made from pooled blood donations of many different people. It may be possible to pass an infection in this medicine. However, the donors are screened for infections and all products are tested for HIV and hepatitis. The medicine is treated to kill most or all bacteria and viruses. Talk to your doctor about the risks and benefits of this medicine. Do not have vaccinations for at least   14 days before, or until at least 3 months after receiving this medicine. What side effects may I notice from receiving this medicine? Side effects that you should report  to your doctor or health care professional as soon as possible:  allergic reactions like skin rash, itching or hives, swelling of the face, lips, or tongue  blue colored lips or skin  breathing problems  chest pain or tightness  fever  signs and symptoms of aseptic meningitis such as stiff neck; sensitivity to light; headache; drowsiness; fever; nausea; vomiting; rash  signs and symptoms of a blood clot such as chest pain; shortness of breath; pain, swelling, or warmth in the leg  signs and symptoms of hemolytic anemia such as fast heartbeat; tiredness; dark yellow or brown urine; or yellowing of the eyes or skin  signs and symptoms of kidney injury like trouble passing urine or change in the amount of urine  sudden weight gain  swelling of the ankles, feet, hands Side effects that usually do not require medical attention (report to your doctor or health care professional if they continue or are bothersome):  diarrhea  flushing  headache  increased sweating  joint pain  muscle cramps  muscle pain  nausea  pain, redness, or irritation at site where injected  tiredness This list may not describe all possible side effects. Call your doctor for medical advice about side effects. You may report side effects to FDA at 1-800-FDA-1088. Where should I keep my medicine? Keep out of the reach of children. This drug is usually given in a hospital or clinic and will not be stored at home. In rare cases, some brands of this medicine may be given at home. If you are using this medicine at home, you will be instructed on how to store this medicine. Throw away any unused medicine after the expiration date on the label. NOTE: This sheet is a summary. It may not cover all possible information. If you have questions about this medicine, talk to your doctor, pharmacist, or health care provider.  2021 [employer] Standard (2018-07-26 12:51:14)  

## 2020-02-12 NOTE — Progress Notes (Signed)
Per MD pt needing labs drawn in 2 weeks to re assess CMET.  RN placed call to Maralyn Sago, RN with [facility] and gave verbal orders for CBC and CMP to be drawn on 02/25/20 and fax results to our office.  RN verbalized understanding of verbal orders.

## 2020-02-13 DIAGNOSIS — D693 Immune thrombocytopenic purpura: Secondary | ICD-10-CM | POA: Diagnosis not present

## 2020-02-13 DIAGNOSIS — E1169 Type 2 diabetes mellitus with other specified complication: Secondary | ICD-10-CM | POA: Diagnosis not present

## 2020-02-13 DIAGNOSIS — E875 Hyperkalemia: Secondary | ICD-10-CM | POA: Diagnosis not present

## 2020-02-13 DIAGNOSIS — I890 Lymphedema, not elsewhere classified: Secondary | ICD-10-CM | POA: Diagnosis not present

## 2020-02-13 DIAGNOSIS — M86672 Other chronic osteomyelitis, left ankle and foot: Secondary | ICD-10-CM | POA: Diagnosis not present

## 2020-02-13 DIAGNOSIS — D631 Anemia in chronic kidney disease: Secondary | ICD-10-CM | POA: Diagnosis not present

## 2020-02-13 DIAGNOSIS — E11621 Type 2 diabetes mellitus with foot ulcer: Secondary | ICD-10-CM | POA: Diagnosis not present

## 2020-02-13 DIAGNOSIS — E1122 Type 2 diabetes mellitus with diabetic chronic kidney disease: Secondary | ICD-10-CM | POA: Diagnosis not present

## 2020-02-13 DIAGNOSIS — N179 Acute kidney failure, unspecified: Secondary | ICD-10-CM | POA: Diagnosis not present

## 2020-02-13 DIAGNOSIS — I130 Hypertensive heart and chronic kidney disease with heart failure and stage 1 through stage 4 chronic kidney disease, or unspecified chronic kidney disease: Secondary | ICD-10-CM | POA: Diagnosis not present

## 2020-02-13 DIAGNOSIS — E1151 Type 2 diabetes mellitus with diabetic peripheral angiopathy without gangrene: Secondary | ICD-10-CM | POA: Diagnosis not present

## 2020-02-13 DIAGNOSIS — J9621 Acute and chronic respiratory failure with hypoxia: Secondary | ICD-10-CM | POA: Diagnosis not present

## 2020-02-13 DIAGNOSIS — L97422 Non-pressure chronic ulcer of left heel and midfoot with fat layer exposed: Secondary | ICD-10-CM | POA: Diagnosis not present

## 2020-02-13 DIAGNOSIS — I5032 Chronic diastolic (congestive) heart failure: Secondary | ICD-10-CM | POA: Diagnosis not present

## 2020-02-13 DIAGNOSIS — N1832 Chronic kidney disease, stage 3b: Secondary | ICD-10-CM | POA: Diagnosis not present

## 2020-02-13 DIAGNOSIS — E1140 Type 2 diabetes mellitus with diabetic neuropathy, unspecified: Secondary | ICD-10-CM | POA: Diagnosis not present

## 2020-02-15 ENCOUNTER — Encounter (HOSPITAL_BASED_OUTPATIENT_CLINIC_OR_DEPARTMENT_OTHER): Payer: Medicare Other | Attending: Internal Medicine | Admitting: Internal Medicine

## 2020-02-15 ENCOUNTER — Other Ambulatory Visit: Payer: Self-pay

## 2020-02-15 DIAGNOSIS — N183 Chronic kidney disease, stage 3 unspecified: Secondary | ICD-10-CM | POA: Insufficient documentation

## 2020-02-15 DIAGNOSIS — Z7984 Long term (current) use of oral hypoglycemic drugs: Secondary | ICD-10-CM | POA: Insufficient documentation

## 2020-02-15 DIAGNOSIS — E1122 Type 2 diabetes mellitus with diabetic chronic kidney disease: Secondary | ICD-10-CM | POA: Diagnosis not present

## 2020-02-15 DIAGNOSIS — Z89511 Acquired absence of right leg below knee: Secondary | ICD-10-CM | POA: Insufficient documentation

## 2020-02-15 DIAGNOSIS — L97522 Non-pressure chronic ulcer of other part of left foot with fat layer exposed: Secondary | ICD-10-CM | POA: Diagnosis not present

## 2020-02-15 DIAGNOSIS — I130 Hypertensive heart and chronic kidney disease with heart failure and stage 1 through stage 4 chronic kidney disease, or unspecified chronic kidney disease: Secondary | ICD-10-CM | POA: Diagnosis not present

## 2020-02-15 DIAGNOSIS — L97523 Non-pressure chronic ulcer of other part of left foot with necrosis of muscle: Secondary | ICD-10-CM | POA: Diagnosis not present

## 2020-02-15 DIAGNOSIS — I5032 Chronic diastolic (congestive) heart failure: Secondary | ICD-10-CM | POA: Diagnosis not present

## 2020-02-15 DIAGNOSIS — Z993 Dependence on wheelchair: Secondary | ICD-10-CM | POA: Diagnosis not present

## 2020-02-15 DIAGNOSIS — E11621 Type 2 diabetes mellitus with foot ulcer: Secondary | ICD-10-CM | POA: Insufficient documentation

## 2020-02-15 DIAGNOSIS — M86672 Other chronic osteomyelitis, left ankle and foot: Secondary | ICD-10-CM | POA: Diagnosis not present

## 2020-02-15 DIAGNOSIS — E1169 Type 2 diabetes mellitus with other specified complication: Secondary | ICD-10-CM | POA: Diagnosis not present

## 2020-02-15 NOTE — Progress Notes (Signed)
[name] ([id number]) Visit Report for 02/15/2020 Debridement Details Patient Name: Date of Service: [name], [name] 02/15/2020 3:45 PM Medical Record Number: [id number] Patient Account Number: [id number] Date of Birth/Sex: Treating RN: 05/11/1965 (55 y.o. [name], [name] Primary Care Provider: [name] Other Clinician: Referring Provider: Treating Provider/Extender: Tonette Bihari, [name] in Treatment: 24 Debridement Performed for Assessment: Wound #1 Left,Proximal,Plantar Foot Performed By: Physician Ricard Dillon., MD Debridement Type: Debridement Severity of Tissue Pre Debridement: Fat layer exposed Level of Consciousness (Pre-procedure): Awake and Alert Pre-procedure Verification/Time Out Yes - 16:55 Taken: Start Time: 16:58 T Area Debrided (L x W): otal 0.5 (cm) x 2.5 (cm) = 1.25 (cm) Tissue and other material debrided: Viable, Non-Viable, Callus, Slough, Subcutaneous, Skin: Epidermis, Slough Level: Skin/Subcutaneous Tissue Debridement Description: Excisional Instrument: Curette Bleeding: Minimum Hemostasis Achieved: Pressure End Time: 17:02 Procedural Pain: 0 Post Procedural Pain: 0 Response to Treatment: Procedure was tolerated well Level of Consciousness (Post- Awake and Alert procedure): Post Debridement Measurements of Total Wound Length: (cm) 0.5 Width: (cm) 2.5 Depth: (cm) 0.2 Volume: (cm) 0.196 Character of Wound/Ulcer Post Debridement: Improved Severity of Tissue Post Debridement: Fat layer exposed Post Procedure Diagnosis Same as Pre-procedure Electronic Signature(s) Signed: 02/15/2020 5:36:32 PM By: Linton Ham MD Signed: 02/15/2020 5:49:38 PM By: Baruch Gouty RN, [name] Entered By: Linton Ham on 02/15/2020 17:23:48 -------------------------------------------------------------------------------- HPI Details Patient Name: Date of Service: [name], [name] 02/15/2020 3:45 PM Medical Record Number:  [id number] Patient Account Number: [id number] Date of Birth/Sex: Treating RN: [birth year] (55 y.o. [name] Primary Care Provider: [name] Other Clinician: Referring Provider: Treating Provider/Extender: Tonette Bihari, [name] in Treatment: 24 History of Present Illness HPI Description: 28ADMISSION 08/30/2019 This is a 55 year old woman with type 2 diabetes and peripheral neuropathy. She developed a blister on the plantar left foot on 7/27. She was seen in the emergency room treated. This did not get any better and she required admission to the hospital from 08/02/2019 through 08/09/2019. She was felt to have cellulitis of the left foot. She was treated with IV medications/antibiotics discharged on Augmentin however she could not tolerate this and she was changed to doxycycline for 5 days she has completed this. She has advanced home care they are apparently putting Xeroform over the wound. When she left the hospital she was supposed to be putting Santyl over the black areas on the left plantar foot covered with Xeroform. X-rays done in the hospital did not show evidence of osteomyelitis only a superficial plantar ulceration. She had blood cultures but I do not see a wound culture. The patient has a past medical history of a right below-knee amputation by Dr. [name] in 2012 presumably for infection in this area although I am not certain. As noted she is a type II diabetic on oral agents with a recent hemoglobin A1c of 5.5. Other past medical history includes ITP, peripheral edema, chronic kidney disease stage III, hypertension, right below-knee amputation, diastolic heart failure. She tells me she is wheelchair-bound and is nonweightbearing although she does push the wheelchair along with the left foot. She is only using hospital sock on the left foot. ABI in our clinic was 1.06 on the left 9/7; the patient cultured Pseudomonas last week out of the deeper proximal wound.  MRI that was ordered showed progressive soft tissue ulceration along the plantar aspect of the foot especially along the medial arch but also plantar to the first metatarsal phalangeal joint no drainable abscess or foreign  body. There was a suspicion for early osteomyelitis in the fifth metatarsal base but she does not have a wound in this area. Also problematically is that where we put her prescription for ciprofloxacin and they did not have the antibiotic and we verified that they are out of ciprofloxacin. We did not receive the message from them to this effect The patient did see Dr. [name] of podiatry. The wound was debrided. He prescribed Santyl which I do not think she is picked up either. Lab work including a CBC sedimentation rate and C-reactive protein were ordered. Finally she is using silver alginate she has home health changing the dressing 3 times a week she does not disturb this in between times 9/23; we have not seen this lady in 2-1/2 weeks largely because of transportation issues. She still has 3 wound areas a deep area in the midfoot and area just above this and then an area on the first met head. She has been using silver alginate on the wound. They are trying to use home health however they are trying to teach her how to do this at be been very difficult for this patient to do so. In discussing things with her she uses this foot to propel her wheelchair I have asked her to stop doing this and to use her arms. She states that she is only moving around in her house. We will need to have a look at her lab work that I quoted from the last time she was here that was ordered by Dr. [name] of podiatry. By our MRI she does not have osteomyelitis in any of the wounded areas. 10/7 2-week follow-up. The patient has 3 wounds including superficially over the left first metatarsal head, left first submetatarsal in the lateral foot and a deep area more distally. Her MRI did not show  osteomyelitis in reference to the wounds that we are treating although did suggest an early possibility in the left fifth metatarsal head however there is no wound in this area. I have reviewed her inflammatory markers. Most recently on 7/26 she had a sedimentation rate of 117 although this was previously greater than 140 and then 108. Last CRP was on 8/20 at 5.1 comparing to previously at 4.27. Noteworthy that this is when she was in the hospital in July I believe. 10/19; patient missed her appointment last week because of transportation issues. We have been using silver collagen. She still has the area over the metatarsal head and the mid part of her foot which are superficial wounds about the same. Marked deterioration in the problematic distal wound. This now is right down into the fascial layer of her foot. Our intake nurse noted purulent drainage. She has a small satellite lesion towards the heel 10/28; PCR culture of the major wound she has on her proximal plantar foot grew both Peptostreptococcus and Corynebacterium. The Corynebacterium could easily be a skin contaminant but Peptostreptococcus deserve treatment and I started her on Augmentin she arrives in clinic today with the wound looking a lot better 11/5; Finishing her augmentin. Patient's foot is not doing well. Now with a probing area laterally of the major wound going laterally. she presents with a new wound on the lateral part of the left foot with purulent drainage. this tunnels widely and toward the plantar foot. It does not connect with the major plantar wound however I am concerned 11/12; Wound on the left lateral foot that had some purulent drainage last week I cultured and  that was negative although she was still on Augmentin at that time which may have affected the culture. Other than that she has the same 3 areas on the plantar part of the left foot including one over the third metatarsal head, a deep area proximally which  has always been her largest wound and one in between. The one in the third metatarsal head and one just distal to this of always been superficial where she has a deep wound in the proximal part of the foot. Today the lateral part of the deeper wound is not nearly ass easy to probe this had a considerable distance last week I told the patient that really the approach this is going to depend on the result of the MRI which is on Monday. If she has an underlying abscess and/or osteomyelitis she will need serious consideration of amputation in this patient who is nonambulatory plus or minus foot surgery and IV antibiotics. We really have not made a lot of progress with her original wounds. 12/3; surprisingly the patient's MRI did not show osteomyelitis but an extensive cellulitis soft tissue infection. With some difficulty 10 days ago we managed to get her on Levaquin and Flagyl. The patient arrives back in clinic today with her 3 original wounds. The area on the lateral foot has closed. 12/17; 2-week follow-up. She is completing her Levaquin should have another 2 days. No additional antibiotics are felt to be necessary. The tunneling area to the lateral part of her foot is closed over. Of the 3 wounds we have been dealing with the area over the met head is fully epithelialized. The middle area it is still eschared and the major distal area is heavily surrounded by thick callus 1/6; she completed her antibiotics has not been here in 3 weeks however. We have been using silver alginate. 2 remaining wounds on the plantar foot she is a diabetic. She does not have arterial issues. MRI did not show osteomyelitis but extensive cellulitis and soft tissue infection. 1/27; 3-week follow-up. She has home health who called [place] to report that her medications were mixed up she apparently has rectified the situation. We have been using silver collagen to the 2 remaining areas on her foot 2/11; the patient is down to the 1  wound most towards her plantar heel. The rest of this is closed. She has been using silver collagen and changing the dressing herself Electronic Signature(s) Signed: 02/15/2020 5:36:32 PM By: Linton Ham MD Entered By: Linton Ham on 02/15/2020 17:24:25 -------------------------------------------------------------------------------- Physical Exam Details Patient Name: Date of Service: [name], [name] 02/15/2020 3:45 PM Medical Record Number: [id number] Patient Account Number: [id number] Date of Birth/Sex: Treating RN: 1965-09-26 (54 y.o. [name] Primary Care Provider: [name] Other Clinician: Referring Provider: Treating Provider/Extender: Tonette Bihari, [name] in Treatment: 24 Notes Wound exam; The third metatarsal head wound is closed is also closed This substantial wound most towards her heel has filled in nicely. This still requires debridement of thick callus skin and subcutaneous tissue from the wound margins and debris on the surface this cleans up quite nicely to a healthy red granulated surface. Hemostasis with direct pressure Electronic Signature(s) Signed: 02/15/2020 5:36:32 PM By: Linton Ham MD Entered By: Linton Ham on 02/15/2020 17:25:27 -------------------------------------------------------------------------------- Physician Orders Details Patient Name: Date of Service: [name], [name] 02/15/2020 3:45 PM Medical Record Number: [id number] Patient Account Number: [id number] Date of Birth/Sex: Treating RN: Jan 16, 1965 (55 y.o. [name] Primary Care Provider: Edison Pace,  Crystal Other Clinician: Referring Provider: Treating Provider/Extender: Tonette Bihari, [name] in Treatment: 24 Verbal / Phone Orders: No Diagnosis Coding ICD-10 Coding Code Description E11.621 Type 2 diabetes mellitus with foot ulcer L97.523 Non-pressure chronic ulcer of other part of left foot with necrosis of  muscle Z89.511 Acquired absence of right leg below knee M86.672 Other chronic osteomyelitis, left ankle and foot Follow-up Appointments Return Appointment in 2 weeks. Bathing/ Shower/ Hygiene May shower with protection but do not get wound dressing(s) wet. Edema Control - Lymphedema / SCD / Other Left Lower Extremity Elevate legs to the level of the heart or above for 30 minutes daily and/or when sitting, a frequency of: - throughout the day Off-Loading Other: - minimal weight bearing left foot, try not to navigate wheelchair with left foot Home Health No change in wound care orders this week; continue Home Health for wound care. May utilize formulary equivalent dressing for wound treatment orders unless otherwise specified. Other Home Health Orders/Instructions: - Advanced Wound Treatment Wound #1 - Foot Wound Laterality: Plantar, Left, Proximal Peri-Wound Care: Sween Lotion (Moisturizing lotion) (Home Health) 3 x Per Week/30 Days Discharge Instructions: Apply moisturizing lotion or equivalent moisterizing lotion to foot and leg with dressing changes Prim Dressing: Promogran Prisma Matrix, 4.34 (sq in) (silver collagen) (Home Health) 3 x Per Week/30 Days ary Discharge Instructions: Moisten collagen with saline or hydrogel Secondary Dressing: Woven Gauze Sponge, Non-Sterile 4x4 in (Home Health) 3 x Per Week/30 Days Discharge Instructions: Apply over primary dressing as directed. Secondary Dressing: Optifoam Non-Adhesive Dressing, 4x4 in (Home Health) 3 x Per Week/30 Days Discharge Instructions: Foam to first met head for protection Compression Wrap: Kerlix Roll 4.5x3.1 (in/yd) (Home Health) 3 x Per Week/30 Days Discharge Instructions: Apply Kerlix and Coban compression as directed. Compression Wrap: Coban Self-Adherent Wrap 4x5 (in/yd) (Home Health) 3 x Per Week/30 Days Discharge Instructions: Apply over Kerlix as directed. Electronic Signature(s) Signed: 02/15/2020 5:36:32 PM By:  Linton Ham MD Signed: 02/15/2020 5:49:38 PM By: Baruch Gouty RN, [name] Entered By: Baruch Gouty on 02/15/2020 17:05:58 -------------------------------------------------------------------------------- Problem List Details Patient Name: Date of Service: [name], [name] 02/15/2020 3:45 PM Medical Record Number: [id number] Patient Account Number: [id number] Date of Birth/Sex: Treating RN: 04-20-1965 (55 y.o. [name] Primary Care Provider: [name] Other Clinician: Referring Provider: Treating Provider/Extender: Tonette Bihari, [name] in Treatment: 24 Active Problems ICD-10 Encounter Code Description Active Date MDM Diagnosis E11.621 Type 2 diabetes mellitus with foot ulcer 08/30/2019 No Yes L97.523 Non-pressure chronic ulcer of other part of left foot with necrosis of muscle 08/30/2019 No Yes Z89.511 Acquired absence of right leg below knee 08/30/2019 No Yes M86.672 Other chronic osteomyelitis, left ankle and foot 08/30/2019 No Yes Inactive Problems Resolved Problems Electronic Signature(s) Signed: 02/15/2020 5:36:32 PM By: Linton Ham MD Entered By: Linton Ham on 02/15/2020 17:23:28 -------------------------------------------------------------------------------- Progress Note Details Patient Name: Date of Service: [name] 02/15/2020 3:45 PM Medical Record Number: [id number] Patient Account Number: [id number] Date of Birth/Sex: Treating RN: 04-Dec-1965 (55 y.o. [name] Primary Care Provider: Other Clinician: [name] Referring Provider: Treating Provider/Extender: Tonette Bihari, [name] in Treatment: 24 Subjective History of Present Illness (HPI) 28ADMISSION 08/30/2019 This is a 55 year old woman with type 2 diabetes and peripheral neuropathy. She developed a blister on the plantar left foot on 7/27. She was seen in the emergency room treated. This did not get any better and she required  admission to the hospital from 08/02/2019 through 08/09/2019. She  was felt to have cellulitis of the left foot. She was treated with IV medications/antibiotics discharged on Augmentin however she could not tolerate this and she was changed to doxycycline for 5 days she has completed this. She has advanced home care they are apparently putting Xeroform over the wound. When she left the hospital she was supposed to be putting Santyl over the black areas on the left plantar foot covered with Xeroform. X-rays done in the hospital did not show evidence of osteomyelitis only a superficial plantar ulceration. She had blood cultures but I do not see a wound culture. The patient has a past medical history of a right below-knee amputation by Dr. [name] in 2012 presumably for infection in this area although I am not certain. As noted she is a type II diabetic on oral agents with a recent hemoglobin A1c of 5.5. Other past medical history includes ITP, peripheral edema, chronic kidney disease stage III, hypertension, right below-knee amputation, diastolic heart failure. She tells me she is wheelchair-bound and is nonweightbearing although she does push the wheelchair along with the left foot. She is only using hospital sock on the left foot. ABI in our clinic was 1.06 on the left 9/7; the patient cultured Pseudomonas last week out of the deeper proximal wound. MRI that was ordered showed progressive soft tissue ulceration along the plantar aspect of the foot especially along the medial arch but also plantar to the first metatarsal phalangeal joint no drainable abscess or foreign body. There was a suspicion for early osteomyelitis in the fifth metatarsal base but she does not have a wound in this area. Also problematically is that where we put her prescription for ciprofloxacin and they did not have the antibiotic and we verified that they are out of ciprofloxacin. We did not receive the message from them to this  effect The patient did see Dr. [name] of podiatry. The wound was debrided. He prescribed Santyl which I do not think she is picked up either. Lab work including a CBC sedimentation rate and C-reactive protein were ordered. Finally she is using silver alginate she has home health changing the dressing 3 times a week she does not disturb this in between times 9/23; we have not seen this lady in 2-1/2 weeks largely because of transportation issues. She still has 3 wound areas a deep area in the midfoot and area just above this and then an area on the first met head. She has been using silver alginate on the wound. They are trying to use home health however they are trying to teach her how to do this at be been very difficult for this patient to do so. In discussing things with her she uses this foot to propel her wheelchair I have asked her to stop doing this and to use her arms. She states that she is only moving around in her house. We will need to have a look at her lab work that I quoted from the last time she was here that was ordered by Dr. [name] of podiatry. By our MRI she does not have osteomyelitis in any of the wounded areas. 10/7 2-week follow-up. The patient has 3 wounds including superficially over the left first metatarsal head, left first submetatarsal in the lateral foot and a deep area more distally. Her MRI did not show osteomyelitis in reference to the wounds that we are treating although did suggest an early possibility in the left fifth metatarsal head however there is no wound in  this area. I have reviewed her inflammatory markers. Most recently on 7/26 she had a sedimentation rate of 117 although this was previously greater than 140 and then 108. Last CRP was on 8/20 at 5.1 comparing to previously at 4.27. Noteworthy that this is when she was in the hospital in July I believe. 10/19; patient missed her appointment last week because of transportation issues. We have been using  silver collagen. She still has the area over the metatarsal head and the mid part of her foot which are superficial wounds about the same. Marked deterioration in the problematic distal wound. This now is right down into the fascial layer of her foot. Our intake nurse noted purulent drainage. She has a small satellite lesion towards the heel 10/28; PCR culture of the major wound she has on her proximal plantar foot grew both Peptostreptococcus and Corynebacterium. The Corynebacterium could easily be a skin contaminant but Peptostreptococcus deserve treatment and I started her on Augmentin she arrives in clinic today with the wound looking a lot better 11/5; Finishing her augmentin. Patient's foot is not doing well. Now with a probing area laterally of the major wound going laterally. she presents with a new wound on the lateral part of the left foot with purulent drainage. this tunnels widely and toward the plantar foot. It does not connect with the major plantar wound however I am concerned 11/12; Wound on the left lateral foot that had some purulent drainage last week I cultured and that was negative although she was still on Augmentin at that time which may have affected the culture. Other than that she has the same 3 areas on the plantar part of the left foot including one over the third metatarsal head, a deep area proximally which has always been her largest wound and one in between. The one in the third metatarsal head and one just distal to this of always been superficial where she has a deep wound in the proximal part of the foot. Today the lateral part of the deeper wound is not nearly ass easy to probe this had a considerable distance last week I told the patient that really the approach this is going to depend on the result of the MRI which is on Monday. If she has an underlying abscess and/or osteomyelitis she will need serious consideration of amputation in this patient who is  nonambulatory plus or minus foot surgery and IV antibiotics. We really have not made a lot of progress with her original wounds. 12/3; surprisingly the patient's MRI did not show osteomyelitis but an extensive cellulitis soft tissue infection. With some difficulty 10 days ago we managed to get her on Levaquin and Flagyl. The patient arrives back in clinic today with her 3 original wounds. The area on the lateral foot has closed. 12/17; 2-week follow-up. She is completing her Levaquin should have another 2 days. No additional antibiotics are felt to be necessary. The tunneling area to the lateral part of her foot is closed over. Of the 3 wounds we have been dealing with the area over the met head is fully epithelialized. The middle area it is still eschared and the major distal area is heavily surrounded by thick callus 1/6; she completed her antibiotics has not been here in 3 weeks however. We have been using silver alginate. 2 remaining wounds on the plantar foot she is a diabetic. She does not have arterial issues. MRI did not show osteomyelitis but extensive cellulitis and soft tissue infection.  1/27; 3-week follow-up. She has home health who called [place] to report that her medications were mixed up she apparently has rectified the situation. We have been using silver collagen to the 2 remaining areas on her foot 2/11; the patient is down to the 1 wound most towards her plantar heel. The rest of this is closed. She has been using silver collagen and changing the dressing herself Objective Constitutional Vitals Time Taken: 3:58 PM, Height: 67 in, Weight: 200 lbs, BMI: 31.3, Temperature: 98.0 F, Pulse: 73 bpm, Respiratory Rate: 16 breaths/min, Blood Pressure: 138/74 mmHg. Integumentary (Hair, Skin) Wound #1 status is Open. Original cause of wound was Blister. The wound is located on the Left,Proximal,Plantar Foot. The wound measures 0.5cm length x 2.5cm width x 0.2cm depth; 0.982cm^2 area and  0.196cm^3 volume. There is Fat Layer (Subcutaneous Tissue) exposed. There is no tunneling or undermining noted. There is a medium amount of serosanguineous drainage noted. The wound margin is well defined and not attached to the wound base. There is large (67- 100%) pink granulation within the wound bed. There is a small (1-33%) amount of necrotic tissue within the wound bed including Adherent Slough. General Notes: callous to periwound. Wound #2 status is Healed - Epithelialized. Original cause of wound was Blister. The wound is located on the [place]. The wound measures 0cm length x 0cm width x 0cm depth; 0cm^2 area and 0cm^3 volume. There is Fat Layer (Subcutaneous Tissue) exposed. There is no tunneling or undermining noted. There is a none present amount of drainage noted. The wound margin is thickened. There is no granulation within the wound bed. There is a large (67- 100%) amount of necrotic tissue within the wound bed including Eschar. General Notes: scabbed over. 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 There was a Excisional Skin/Subcutaneous Tissue Debridement with a total area of 1.25 sq cm performed by Ricard Dillon., MD. With the following instrument(s): Curette to remove Viable and Non-Viable tissue/material. Material removed includes Callus, Subcutaneous Tissue, Slough, and Skin: Epidermis. No specimens were taken. A time out was conducted at 16:55, prior to the start of the procedure. A Minimum amount of bleeding was controlled with Pressure. The procedure  was tolerated well with a pain level of 0 throughout and a pain level of 0 following the procedure. Post Debridement Measurements: 0.5cm length x 2.5cm width x 0.2cm depth; 0.196cm^3 volume. Character of Wound/Ulcer Post Debridement is improved. Severity of Tissue Post Debridement is: Fat layer exposed. Post procedure Diagnosis Wound #1: Same as Pre-Procedure Plan Follow-up Appointments: Return Appointment in 2 weeks. Bathing/ Shower/ Hygiene: May shower with protection but do not get wound dressing(s) wet. Edema Control - Lymphedema / SCD / Other: Elevate legs to the level of the heart or above for 30 minutes daily and/or when sitting, a frequency of: - throughout the day Off-Loading: Other: - minimal weight bearing left foot, try not to navigate wheelchair with left foot Home Health: No change in wound care orders this week; continue Home Health for wound care. May utilize formulary equivalent dressing for wound treatment orders unless otherwise specified. Other Home Health Orders/Instructions: - Advanced WOUND #1: -  Foot Wound Laterality: Plantar, Left, Proximal Peri-Wound Care: Sween Lotion (Moisturizing lotion) (Home Health) 3 x Per Week/30 Days Discharge Instructions: Apply moisturizing lotion or equivalent moisterizing lotion to foot and leg with dressing changes Prim Dressing: Promogran Prisma Matrix, 4.34 (sq in) (silver collagen) (Home Health) 3 x Per Week/30 Days ary Discharge Instructions: Moisten collagen with saline or hydrogel Secondary Dressing: Woven Gauze Sponge, Non-Sterile 4x4 in (Home Health) 3 x Per Week/30 Days Discharge Instructions: Apply over primary dressing as directed. Secondary Dressing: Optifoam Non-Adhesive Dressing, 4x4 in (Home Health) 3 x Per Week/30 Days Discharge Instructions: Foam to first met head for protection Com pression Wrap: Kerlix Roll 4.5x3.1 (in/yd) (Home Health) 3 x Per Week/30 Days Discharge Instructions: Apply Kerlix and Coban compression  as directed. Com pression Wrap: Coban Self-Adherent Wrap 4x5 (in/yd) (Home Health) 3 x Per Week/30 Days Discharge Instructions: Apply over Kerlix as directed. 1. I am continue with collagen to her 1 remaining wound 2. This may be closed by the next time we see her in 2 weeks Electronic Signature(s) Signed: 02/15/2020 5:36:32 PM By: Linton Ham MD Entered By: Linton Ham on 02/15/2020 17:25:57 -------------------------------------------------------------------------------- SuperBill Details Patient Name: Date of Service: [name] 02/15/2020 Medical Record Number: [id number] Patient Account Number: [id number] Date of Birth/Sex: Treating RN: [birth year] (55 y.o. [name], [name] Primary Care Provider: [name] Other Clinician: Referring Provider: Treating Provider/Extender: Tonette Bihari, [name] in Treatment: 24 Diagnosis Coding ICD-10 Codes Code Description E11.621 Type 2 diabetes mellitus with foot ulcer L97.523 Non-pressure chronic ulcer of other part of left foot with necrosis of muscle Z89.511 Acquired absence of right leg below knee M86.672 Other chronic osteomyelitis, left ankle and foot Facility Procedures CPT4 Code: [id number] Description: 54982 - DEB SUBQ TISSUE 20 SQ CM/< ICD-10 Diagnosis Description L97.523 Non-pressure chronic ulcer of other part of left foot with necrosis of muscle Modifier: Quantity: 1 Physician Procedures : CPT4 Code Description Modifier [id number] - WC PHYS SUBQ TISS 20 SQ CM ICD-10 Diagnosis Description L97.523 Non-pressure chronic ulcer of other part of left foot with necrosis of muscle Quantity: 1 Electronic Signature(s) Signed: 02/15/2020 5:36:32 PM By: Linton Ham MD Entered By: Linton Ham on 02/15/2020 17:26:26

## 2020-02-18 NOTE — Progress Notes (Signed)
LANEY, LOUDERBACK ([id number]) Visit Report for 02/15/2020 Arrival Information Details Patient Name: Date of Service: [place] Cheryl, Wolf 02/15/2020 3:45 PM Medical Record Number: [id number] Patient Account Number: [id number] Date of Birth/Sex: Treating RN: 1965/01/26 (55 y.o. [name], Linda Primary Care Gini Caputo: [name] Other Clinician: Referring Kalynne Womac: Treating Michai Dieppa/Extender: Tonette Bihari, [name] in Treatment: 24 Visit Information History Since Last Visit Added or deleted any medications: No Patient Arrived: Wheel Chair Any new allergies or adverse reactions: No Arrival Time: 15:56 Had a fall or experienced change in No Accompanied By: self activities of daily living that may affect Transfer Assistance: None risk of falls: Patient Identification Verified: Yes Signs or symptoms of abuse/neglect since last visito No Secondary Verification Process Completed: Yes Hospitalized since last visit: No Patient Requires Transmission-Based Precautions: No Implantable device outside of the clinic excluding No Patient Has Alerts: No cellular tissue based products placed in the center since last visit: Has Dressing in Place as Prescribed: Yes Pain Present Now: No Electronic Signature(s) Signed: 02/18/2020 10:55:04 AM By: Sandre Kitty Entered By: Sandre Kitty on 02/15/2020 15:58:57 -------------------------------------------------------------------------------- Encounter Discharge Information Details Patient Name: Date of Service: [name] 02/15/2020 3:45 PM Medical Record Number: [id number] Patient Account Number: [id number] Date of Birth/Sex: Treating RN: 01-12-1965 (55 y.o. [name] Primary Care Cordarrel Stiefel: [name] Other Clinician: Referring Caylor Cerino: Treating Tateanna Bach/Extender: Tonette Bihari, [name] in Treatment: 24 Encounter Discharge Information Items Post Procedure Vitals Discharge Condition:  Stable Temperature (F): 98 Ambulatory Status: Wheelchair Pulse (bpm): 73 Discharge Destination: Home Respiratory Rate (breaths/min): 16 Transportation: Private Auto Blood Pressure (mmHg): 138/74 Accompanied By: self Schedule Follow-up Appointment: Yes Clinical Summary of Care: Electronic Signature(s) Signed: 02/15/2020 5:46:28 PM By: Deon Pilling Entered By: Deon Pilling on 02/15/2020 17:46:12 -------------------------------------------------------------------------------- Lower Extremity Assessment Details Patient Name: Date of Service: KRISHNA, HEUER 02/15/2020 3:45 PM Medical Record Number: [id number] Patient Account Number: [id number] Date of Birth/Sex: Treating RN: July 20, 1965 (55 y.o. [name] Primary Care Aleta Manternach: [name] Other Clinician: Referring Tylisa Alcivar: Treating Salena Ortlieb/Extender: Tonette Bihari, [name] in Treatment: 24 Edema Assessment Assessed: [Left: Yes] [Right: No] Edema: [Left: Ye] [Right: s] Calf Left: Right: Point of Measurement: From Medial Instep 36 cm Ankle Left: Right: Point of Measurement: From Medial Instep 23 cm Vascular Assessment Pulses: Dorsalis Pedis Palpable: [Left:Yes] Electronic Signature(s) Signed: 02/15/2020 5:46:28 PM By: Deon Pilling Entered By: Deon Pilling on 02/15/2020 16:17:45 -------------------------------------------------------------------------------- Multi Wound Chart Details Patient Name: Date of Service: [name] 02/15/2020 3:45 PM Medical Record Number: [id number] Patient Account Number: [id number] Date of Birth/Sex: Treating RN: 1965-06-05 (55 y.o. [name], [name] Primary Care Cyndia Degraff: [name] Other Clinician: Referring Arlethia Basso: Treating Adams Hinch/Extender: Tonette Bihari, [name] in Treatment: 24 Vital Signs Height(in): 2 Pulse(bpm): 19 Weight(lbs): 200 Blood Pressure(mmHg): 138/74 Body Mass Index(BMI): 31 Temperature(F):  98.0 Respiratory Rate(breaths/min): 16 Photos: [1:No Photos Left, Proximal, Plantar Foot] [2:No Photos Left, Distal, Plantar Foot] [N/A:N/A N/A] Wound Location: [1:Blister] [2:Blister] [N/A:N/A] Wounding Event: [1:Diabetic Wound/Ulcer of the Lower] [2:Diabetic Wound/Ulcer of the Lower] [N/A:N/A] Primary Etiology: [1:Extremity Cataracts, Anemia, Congestive Heart] [2:Extremity Cataracts, Anemia, Congestive Heart] [N/A:N/A] Comorbid History: [1:Failure, Hypertension, Type II Diabetes, Osteomyelitis, Neuropathy 08/02/2019] [2:Failure, Hypertension, Type II Diabetes, Osteomyelitis, Neuropathy 08/02/2019] [N/A:N/A] Date Acquired: [1:24] [2:24] [N/A:N/A] Weeks of Treatment: [1:Open] [2:Healed - Epithelialized] [N/A:N/A] Wound Status: [1:0.5x2.5x0.2] [2:0x0x0] [N/A:N/A] Measurements L x W x D (cm) [1:0.982] [2:0] [N/A:N/A] A (cm) : rea [1:0.196] [2:0] [N/A:N/A] Volume (cm) : [1:77.70%] [2:100.00%] [N/A:N/A] %  Reduction in A rea: [1:98.10%] [2:100.00%] [N/A:N/A] % Reduction in Volume: [1:Grade 2] [2:Grade 2] [N/A:N/A] Classification: [1:Medium] [2:None Present] [N/A:N/A] Exudate A mount: [1:Serosanguineous] [2:N/A] [N/A:N/A] Exudate Type: [1:red, brown] [2:N/A] [N/A:N/A] Exudate Color: [1:Well defined, not attached] [2:Thickened] [N/A:N/A] Wound Margin: [1:Large (67-100%)] [2:None Present (0%)] [N/A:N/A] Granulation A mount: [1:Pink] [2:N/A] [N/A:N/A] Granulation Quality: [1:Small (1-33%)] [2:Large (67-100%)] [N/A:N/A] Necrotic A mount: [1:Adherent Slough] [2:Eschar] [N/A:N/A] Necrotic Tissue: [1:Fat Layer (Subcutaneous Tissue): Yes Fat Layer (Subcutaneous Tissue): Yes N/A] Exposed Structures: [1:Fascia: No Tendon: No Muscle: No Joint: No Bone: No Medium (34-66%)] [2:Fascia: No Tendon: No Muscle: No Joint: No Bone: No None] [N/A:N/A] Epithelialization: [1:Debridement - Excisional] [2:N/A] [N/A:N/A] Debridement: Pre-procedure Verification/Time Out 16:55 [2:N/A] [N/A:N/A] Taken: [1:Callus,  Subcutaneous, Slough] [2:N/A] [N/A:N/A] Tissue Debrided: [1:Skin/Subcutaneous Tissue] [2:N/A] [N/A:N/A] Level: [1:1.25] [2:N/A] [N/A:N/A] Debridement A (sq cm): [1:rea Curette] [2:N/A] [N/A:N/A] Instrument: [1:Minimum] [2:N/A] [N/A:N/A] Bleeding: [1:Pressure] [2:N/A] [N/A:N/A] Hemostasis A chieved: [1:0] [2:N/A] [N/A:N/A] Procedural Pain: [1:0] [2:N/A] [N/A:N/A] Post Procedural Pain: [1:Procedure was tolerated well] [2:N/A] [N/A:N/A] Debridement Treatment Response: [1:0.5x2.5x0.2] [2:N/A] [N/A:N/A] Post Debridement Measurements L x W x D (cm) [1:0.196] [2:N/A] [N/A:N/A] Post Debridement Volume: (cm) [1:callous to periwound.] [2:scabbed over.] [N/A:N/A] Assessment Notes: [1:Debridement] [2:N/A] [N/A:N/A] Treatment Notes Electronic Signature(s) Signed: 02/15/2020 5:36:32 PM By: Linton Ham MD Signed: 02/15/2020 5:49:38 PM By: Baruch Gouty RN, [name] Entered By: Linton Ham on 02/15/2020 17:23:37 -------------------------------------------------------------------------------- Multi-Disciplinary Care Plan Details Patient Name: Date of Service: Cheryl NEZIAH, Wolf 02/15/2020 3:45 PM Medical Record Number: [id number] Patient Account Number: [id number] Date of Birth/Sex: Treating RN: 1965-08-07 (55 y.o. [name] Primary Care Jesusa Stenerson: [name] Other Clinician: Referring Darcy Cordner: Treating Matteo Banke/Extender: Tonette Bihari, [name] in Treatment: 24 Active Inactive Abuse / Safety / Falls / Self Care Management Nursing Diagnoses: Potential for falls Potential for injury related to falls Goals: Patient will not experience any injury related to falls Date Initiated: 08/30/2019 Target Resolution Date: 02/29/2020 Goal Status: Active Patient/caregiver will verbalize/demonstrate measures taken to prevent injury and/or falls Date Initiated: 08/30/2019 Date Inactivated: 09/27/2019 Target Resolution Date: 09/28/2019 Goal Status: Met Interventions: Assess  Activities of Daily Living upon admission and as needed Assess fall risk on admission and as needed Assess: immobility, friction, shearing, incontinence upon admission and as needed Assess impairment of mobility on admission and as needed per policy Assess personal safety and home safety (as indicated) on admission and as needed Assess self care needs on admission and as needed Provide education on fall prevention Provide education on personal and home safety Notes: Nutrition Nursing Diagnoses: Impaired glucose control: actual or potential Potential for alteratiion in Nutrition/Potential for imbalanced nutrition Goals: Patient/caregiver agrees to and verbalizes understanding of need to use nutritional supplements and/or vitamins as prescribed Date Initiated: 08/30/2019 Date Inactivated: 10/11/2019 Target Resolution Date: 10/05/2019 Goal Status: Met Patient/caregiver will maintain therapeutic glucose control Date Initiated: 08/30/2019 Target Resolution Date: 02/29/2020 Goal Status: Active Interventions: Assess HgA1c results as ordered upon admission and as needed Assess patient nutrition upon admission and as needed per policy Provide education on elevated blood sugars and impact on wound healing Provide education on nutrition Treatment Activities: Education provided on Nutrition : 09/11/2019 Notes: Wound/Skin Impairment Nursing Diagnoses: Impaired tissue integrity Knowledge deficit related to ulceration/compromised skin integrity Goals: Patient/caregiver will verbalize understanding of skin care regimen Date Initiated: 08/30/2019 Target Resolution Date: 03/07/2020 Goal Status: Active Interventions: Assess patient/caregiver ability to obtain necessary supplies Assess patient/caregiver ability to perform ulcer/skin care regimen upon admission and as needed Assess ulceration(s) every visit Provide education on  ulcer and skin care Notes: Electronic Signature(s) Signed: 02/15/2020  5:49:38 PM By: Baruch Gouty RN, [name] Entered By: Baruch Gouty on 02/15/2020 16:57:46 -------------------------------------------------------------------------------- Pain Assessment Details Patient Name: Date of Service: Cheryl Cheryl, Wolf 02/15/2020 3:45 PM Medical Record Number: [id number] Patient Account Number: [id number] Date of Birth/Sex: Treating RN: 07/11/1965 (55 y.o. [name] Primary Care Michelangelo Rindfleisch: Other Clinician: [name] Referring Dulcinea Kinser: Treating Hadessah Grennan/Extender: Tonette Bihari, [name] in Treatment: 24 Active Problems Location of Pain Severity and Description of Pain Patient Has Paino No Site Locations Pain Management and Medication Current Pain Management: Electronic Signature(s) Signed: 02/15/2020 5:49:38 PM By: Baruch Gouty RN, [name] Signed: 02/18/2020 10:55:04 AM By: Sandre Kitty Entered By: Sandre Kitty on 02/15/2020 15:59:23 -------------------------------------------------------------------------------- Patient/Caregiver Education Details Patient Name: Date of Service: Cheryl KARLYE, Wolf 2/11/2022andnbsp3:45 PM Medical Record Number: [id number] Patient Account Number: [id number] Date of Birth/Gender: Treating RN: 05-31-1965 (55 y.o. [name] Primary Care Physician: [name] Other Clinician: Referring Physician: Treating Physician/Extender: Sonda Primes in Treatment: 24 Education Assessment Education Provided To: Patient Education Topics Provided Offloading: Methods: Explain/Verbal Responses: Reinforcements needed Wound/Skin Impairment: Methods: Explain/Verbal Responses: Reinforcements needed, State content correctly Electronic Signature(s) Signed: 02/15/2020 5:49:38 PM By: Baruch Gouty RN, [name] Entered By: Baruch Gouty on 02/15/2020 16:58:12 -------------------------------------------------------------------------------- Wound Assessment Details Patient  Name: Date of Service: Cheryl KIASHA, BELLIN 02/15/2020 3:45 PM Medical Record Number: [id number] Patient Account Number: [id number] Date of Birth/Sex: Treating RN: [birth year] (55 y.o. [name] Primary Care Logyn Dedominicis: [name] Other Clinician: Referring Koji Niehoff: Treating Ahmira Boisselle/Extender: Tonette Bihari, [name] in Treatment: 24 Wound Status Wound Number: 1 Primary Diabetic Wound/Ulcer of the Lower Extremity Etiology: Wound Location: Left, Proximal, Plantar Foot Wound Open Wounding Event: Blister Status: Date Acquired: 08/02/2019 Comorbid Cataracts, Anemia, Congestive Heart Failure, Hypertension, Type Weeks Of Treatment: 24 History: II Diabetes, Osteomyelitis, Neuropathy Clustered Wound: No Wound Measurements Length: (cm) 0.5 Width: (cm) 2.5 Depth: (cm) 0.2 Area: (cm) 0.982 Volume: (cm) 0.196 % Reduction in Area: 77.7% % Reduction in Volume: 98.1% Epithelialization: Medium (34-66%) Tunneling: No Undermining: No Wound Description Classification: Grade 2 Wound Margin: Well defined, not attached Exudate Amount: Medium Exudate Type: Serosanguineous Exudate Color: red, brown Foul Odor After Cleansing: No Slough/Fibrino Yes Wound Bed Granulation Amount: Large (67-100%) Exposed Structure Granulation Quality: Pink Fascia Exposed: No Necrotic Amount: Small (1-33%) Fat Layer (Subcutaneous Tissue) Exposed: Yes Necrotic Quality: Adherent Slough Tendon Exposed: No Muscle Exposed: No Joint Exposed: No Bone Exposed: No Assessment Notes callous to periwound. Treatment Notes Wound #1 (Foot) Wound Laterality: Plantar, Left, Proximal Cleanser Peri-Wound Care Sween Lotion (Moisturizing lotion) Discharge Instruction: Apply moisturizing lotion or equivalent moisterizing lotion to foot and leg with dressing changes Topical Primary Dressing Promogran Prisma Matrix, 4.34 (sq in) (silver collagen) Discharge Instruction: Moisten collagen with saline or  hydrogel Secondary Dressing Woven Gauze Sponge, Non-Sterile 4x4 in Discharge Instruction: Apply over primary dressing as directed. Optifoam Non-Adhesive Dressing, 4x4 in Discharge Instruction: Foam to first met head for protection Secured With Compression Wrap Kerlix Roll 4.5x3.1 (in/yd) Discharge Instruction: Apply Kerlix and Coban compression as directed. Coban Self-Adherent Wrap 4x5 (in/yd) Discharge Instruction: Apply over Kerlix as directed. Compression Stockings Add-Ons Electronic Signature(s) Signed: 02/15/2020 5:46:28 PM By: Deon Pilling Signed: 02/15/2020 5:49:38 PM By: Baruch Gouty RN, [name] Entered By: Deon Pilling on 02/15/2020 16:18:02 -------------------------------------------------------------------------------- Wound Assessment Details Patient Name: Date of Service: Cheryl Cheryl, Wolf 02/15/2020 3:45 PM Medical Record Number: [id number] Patient Account Number: [id number] Date of Birth/Sex: Treating  RN: 04/21/1965 (55 y.o. [name] Primary Care Azlaan Isidore: [name] Other Clinician: Referring Bobette Leyh: Treating Juancarlos Crescenzo/Extender: Tonette Bihari, [name] in Treatment: 24 Wound Status Wound Number: 2 Primary Diabetic Wound/Ulcer of the Lower Extremity Etiology: Wound Location: Left, Distal, Plantar Foot Wound Healed - Epithelialized Wounding Event: Blister Status: Date Acquired: 08/02/2019 Comorbid Cataracts, Anemia, Congestive Heart Failure, Hypertension, Type Weeks Of Treatment: 24 History: II Diabetes, Osteomyelitis, Neuropathy Clustered Wound: No Wound Measurements Length: (cm) Width: (cm) Depth: (cm) Area: (cm) Volume: (cm) 0 % Reduction in Area: 100% 0 % Reduction in Volume: 100% 0 Epithelialization: None 0 Tunneling: No 0 Undermining: No Wound Description Classification: Grade 2 Wound Margin: Thickened Exudate Amount: None Present Foul Odor After Cleansing: No Slough/Fibrino Yes Wound Bed Granulation Amount:  None Present (0%) Exposed Structure Necrotic Amount: Large (67-100%) Fascia Exposed: No Necrotic Quality: Eschar Fat Layer (Subcutaneous Tissue) Exposed: Yes Tendon Exposed: No Muscle Exposed: No Joint Exposed: No Bone Exposed: No Assessment Notes scabbed over. Treatment Notes Wound #2 (Foot) Wound Laterality: Plantar, Left, Distal Cleanser Peri-Wound Care Topical Primary Dressing Secondary Dressing Secured With Compression Wrap Compression Stockings Add-Ons Electronic Signature(s) Signed: 02/15/2020 5:49:38 PM By: Baruch Gouty RN, [name] Entered By: Baruch Gouty on 02/15/2020 17:02:03 -------------------------------------------------------------------------------- Vitals Details Patient Name: Date of Service: [name] 02/15/2020 3:45 PM Medical Record Number: [id number] Patient Account Number: [id number] Date of Birth/Sex: Treating RN: March 25, 1965 (55 y.o. [name] Primary Care Tyrone Pautsch: [name] Other Clinician: Referring Tessah Patchen: Treating Jodelle Fausto/Extender: Tonette Bihari, [name] in Treatment: 24 Vital Signs Time Taken: 15:58 Temperature (F): 98.0 Height (in): 67 Pulse (bpm): 73 Weight (lbs): 200 Respiratory Rate (breaths/min): 16 Body Mass Index (BMI): 31.3 Blood Pressure (mmHg): 138/74 Reference Range: 80 - 120 mg / dl Electronic Signature(s) Signed: 02/18/2020 10:55:04 AM By: Sandre Kitty Entered By: Sandre Kitty on 02/15/2020 15:59:17

## 2020-02-28 DIAGNOSIS — E11621 Type 2 diabetes mellitus with foot ulcer: Secondary | ICD-10-CM | POA: Diagnosis not present

## 2020-02-29 ENCOUNTER — Telehealth: Payer: Self-pay | Admitting: Hematology and Oncology

## 2020-02-29 ENCOUNTER — Other Ambulatory Visit: Payer: Self-pay

## 2020-02-29 ENCOUNTER — Encounter (HOSPITAL_BASED_OUTPATIENT_CLINIC_OR_DEPARTMENT_OTHER): Payer: Medicare Other | Admitting: Internal Medicine

## 2020-02-29 DIAGNOSIS — L97522 Non-pressure chronic ulcer of other part of left foot with fat layer exposed: Secondary | ICD-10-CM | POA: Diagnosis not present

## 2020-02-29 DIAGNOSIS — I130 Hypertensive heart and chronic kidney disease with heart failure and stage 1 through stage 4 chronic kidney disease, or unspecified chronic kidney disease: Secondary | ICD-10-CM | POA: Diagnosis not present

## 2020-02-29 DIAGNOSIS — E11621 Type 2 diabetes mellitus with foot ulcer: Secondary | ICD-10-CM | POA: Diagnosis not present

## 2020-02-29 DIAGNOSIS — Z7984 Long term (current) use of oral hypoglycemic drugs: Secondary | ICD-10-CM | POA: Diagnosis not present

## 2020-02-29 DIAGNOSIS — L97523 Non-pressure chronic ulcer of other part of left foot with necrosis of muscle: Secondary | ICD-10-CM | POA: Diagnosis not present

## 2020-02-29 DIAGNOSIS — Z89511 Acquired absence of right leg below knee: Secondary | ICD-10-CM | POA: Diagnosis not present

## 2020-02-29 DIAGNOSIS — E1122 Type 2 diabetes mellitus with diabetic chronic kidney disease: Secondary | ICD-10-CM | POA: Diagnosis not present

## 2020-02-29 DIAGNOSIS — N183 Chronic kidney disease, stage 3 unspecified: Secondary | ICD-10-CM | POA: Diagnosis not present

## 2020-02-29 DIAGNOSIS — Z993 Dependence on wheelchair: Secondary | ICD-10-CM | POA: Diagnosis not present

## 2020-02-29 DIAGNOSIS — I5032 Chronic diastolic (congestive) heart failure: Secondary | ICD-10-CM | POA: Diagnosis not present

## 2020-02-29 DIAGNOSIS — M86672 Other chronic osteomyelitis, left ankle and foot: Secondary | ICD-10-CM | POA: Diagnosis not present

## 2020-02-29 DIAGNOSIS — E1169 Type 2 diabetes mellitus with other specified complication: Secondary | ICD-10-CM | POA: Diagnosis not present

## 2020-02-29 NOTE — Progress Notes (Addendum)
[name], BAUTCH ([id number]) Visit Report for 02/29/2020 Arrival Information Details Patient Name: Date of Service: [place] [name], BERQUIST 02/29/2020 3:30 PM Medical Record Number: [id number] Patient Account Number: [id number] Date of Birth/Sex: Treating RN: [birth year] (55 y.o. [name], Lauren Primary Care : [name] Other Clinician: Referring : Treating /Extender: Tonette Bihari, [name] in Treatment: 9 Visit Information History Since Last Visit Added or deleted any medications: No Patient Arrived: Wheel Chair Any new allergies or adverse reactions: No Arrival Time: 15:44 Had a fall or experienced change in No Accompanied By: self activities of daily living that may affect Transfer Assistance: None risk of falls: Patient Identification Verified: Yes Signs or symptoms of abuse/neglect since last visito No Secondary Verification Process Completed: Yes Hospitalized since last visit: No Patient Requires Transmission-Based Precautions: No Implantable device outside of the clinic excluding No Patient Has Alerts: No cellular tissue based products placed in the center since last visit: Has Dressing in Place as Prescribed: Yes Pain Present Now: No Electronic Signature(s) Signed: 02/29/2020 5:08:13 PM By: Rhae Hammock RN Entered By: Rhae Hammock on 02/29/2020 15:44:32 -------------------------------------------------------------------------------- Clinic Level of Care Assessment Details Patient Name: Date of Service: [place] Cheryl, Wolf 02/29/2020 3:30 PM Medical Record Number: [id number] Patient Account Number: [id number] Date of Birth/Sex: Treating RN: 27-Oct-1965 (55 y.o. [name], [place] Primary Care : [name] Other Clinician: Referring : Treating /Extender: Tonette Bihari, [name] in Treatment: 26 Clinic Level of Care Assessment Items TOOL 4 Quantity Score _0  - 0 Use when  only an EandM is performed on FOLLOW-UP visit ASSESSMENTS - Nursing Assessment / Reassessment X- 1 10 Reassessment of Co-morbidities (includes updates in patient status) X- 1 5 Reassessment of Adherence to Treatment Plan ASSESSMENTS - Wound and Skin A ssessment / Reassessment X - Simple Wound Assessment / Reassessment - one wound 1 5 _1  - 0 Complex Wound Assessment / Reassessment - multiple wounds _2  - 0 Dermatologic / Skin Assessment (not related to wound area) ASSESSMENTS - Focused Assessment X- 1 5 Circumferential Edema Measurements - multi extremities _3  - 0 Nutritional Assessment / Counseling / Intervention X- 1 5 Lower Extremity Assessment (monofilament, tuning fork, pulses) _4  - 0 Peripheral Arterial Disease Assessment (using hand held doppler) ASSESSMENTS - Ostomy and/or Continence Assessment and Care _5  - 0 Incontinence Assessment and Management _6  - 0 Ostomy Care Assessment and Management (repouching, etc.) PROCESS - Coordination of Care X - Simple Patient / Family Education for ongoing care 1 15 _7  - 0 Complex (extensive) Patient / Family Education for ongoing care X- 1 10 Staff obtains Programmer, systems, Records, T Results / Process Orders est X- 1 10 Staff telephones HHA, Nursing Homes / Clarify orders / etc _8  - 0 Routine Transfer to another Facility (non-emergent condition) _9  - 0 Routine Hospital Admission (non-emergent condition) _10  - 0 New Admissions / Biomedical engineer / Ordering NPWT Apligraf, etc. , _11  - 0 Emergency Hospital Admission (emergent condition) X- 1 10 Simple Discharge Coordination _12  - 0 Complex (extensive) Discharge Coordination PROCESS - Special Needs _13  - 0 Pediatric / Minor Patient Management _14  - 0 Isolation Patient Management _15  - 0 Hearing / Language / Visual special needs _16  - 0 Assessment of Community assistance (transportation, D/C planning, etc.) _17  - 0 Additional assistance / Altered mentation _18  - 0 Support  Surface(s) Assessment (bed, cushion, seat, etc.) INTERVENTIONS - Wound Cleansing / Measurement X - Simple Wound Cleansing - one wound 1 5 _19  - 0 Complex Wound Cleansing - multiple wounds  X- 1 5 Wound Imaging (photographs - any number of wounds) _0  - 0 Wound Tracing (instead of photographs) X- 1 5 Simple Wound Measurement - one wound _1  - 0 Complex Wound Measurement - multiple wounds INTERVENTIONS - Wound Dressings X - Small Wound Dressing one or multiple wounds 1 10 _2  - 0 Medium Wound Dressing one or multiple wounds _3  - 0 Large Wound Dressing one or multiple wounds X- 1 5 Application of Medications - topical <HYQMVHQIONGEXBMW>_4<\/[id number]>_4  - 0 Application of Medications - injection INTERVENTIONS - Miscellaneous _5  - 0 External ear exam _6  - 0 Specimen Collection (cultures, biopsies, blood, body fluids, etc.) _7  - 0 Specimen(s) / Culture(s) sent or taken to Lab for analysis _8  - 0 Patient Transfer (multiple staff / Civil Service fast streamer / Similar devices) _9  - 0 Simple Staple / Suture removal (25 or less) _10  - 0 Complex Staple / Suture removal (26 or more) _11  - 0 Hypo / Hyperglycemic Management (close monitor of Blood Glucose) _12  - 0 Ankle / Brachial Index (ABI) - do not check if billed separately X- 1 5 Vital Signs Has the patient been seen at the hospital within the last three years: Yes Total Score: 110 Level Of Care: New/Established - Level 3 Electronic Signature(s) Signed: 02/29/2020 5:08:13 PM By: Rhae Hammock RN Entered By: Rhae Hammock on 02/29/2020 16:18:04 -------------------------------------------------------------------------------- Encounter Discharge Information Details Patient Name: Date of Service: [place] [name] 02/29/2020 3:30 PM Medical Record Number: [id number] Patient Account Number: [id number] Date of Birth/Sex: Treating RN: 1965/02/03 (55 y.o. [name] Primary Care : [name] Other Clinician: Referring : Treating /Extender:  Tonette Bihari, [name] in Treatment: 26 Encounter Discharge Information Items Discharge Condition: Stable Ambulatory Status: Wheelchair Discharge Destination: Home Transportation: Private Auto Accompanied By: self Schedule Follow-up Appointment: Yes Clinical Summary of Care: Electronic Signature(s) Signed: 02/29/2020 5:07:55 PM By: Deon Pilling Entered By: Deon Pilling on 02/29/2020 17:07:42 -------------------------------------------------------------------------------- Lower Extremity Assessment Details Patient Name: Date of Service: [name], EDDINGER 02/29/2020 3:30 PM Medical Record Number: [id number] Patient Account Number: [id number] Date of Birth/Sex: Treating RN: [birth year] (55 y.o. [name], Lauren Primary Care : [name] Other Clinician: Referring : Treating /Extender: Tonette Bihari, [name] in Treatment: 26 Edema Assessment Assessed: [Left: Yes] [Right: No] Edema: [Left: Ye] [Right: s] Calf Left: Right: Point of Measurement: From Medial Instep 37.7 cm Ankle Left: Right: Point of Measurement: From Medial Instep 23 cm Vascular Assessment Pulses: Dorsalis Pedis Palpable: [Left:Yes] Posterior Tibial Palpable: [Left:Yes] Electronic Signature(s) Signed: 02/29/2020 5:08:13 PM By: Rhae Hammock RN Entered By: Rhae Hammock on 02/29/2020 15:54:59 -------------------------------------------------------------------------------- Multi Wound Chart Details Patient Name: Date of Service: [place] [name] 02/29/2020 3:30 PM Medical Record Number: [id number] Patient Account Number: [id number] Date of Birth/Sex: Treating RN: 1965/01/12 (56 y.o. [name], [name] Primary Care : [name] Other Clinician: Referring : Treating /Extender: Tonette Bihari, [name] in Treatment: 26 Vital Signs Height(in): 49 Pulse(bpm): 5 Weight(lbs): 200 Blood Pressure(mmHg):  149/74 Body Mass Index(BMI): 31 Temperature(F): 98.3 Respiratory Rate(breaths/min): 14 Photos: [1:No Photos Left, Proximal, Plantar Foot] [N/A:N/A N/A] Wound Location: [1:Blister] [N/A:N/A] Wounding Event: [1:Diabetic Wound/Ulcer of the Lower] [N/A:N/A] Primary Etiology: [1:Extremity Cataracts, Anemia, Congestive Heart N/A] Comorbid History: [1:Failure, Hypertension, Type II Diabetes, Osteomyelitis, Neuropathy 08/02/2019] [N/A:N/A] Date Acquired: [1:26] [N/A:N/A] Weeks of Treatment: [1:Open] [N/A:N/A] Wound Status: [1:0.4x1.7x0.2] [N/A:N/A] Measurements L x W x D (cm) [1:0.534] [N/A:N/A] A (cm) : rea [1:0.107] [N/A:N/A] Volume (cm) : [1:87.90%] [N/A:N/A] % Reduction in A rea: [1:99.00%] [  N/A:N/A] % Reduction in Volume: [1:Grade 2] [N/A:N/A] Classification: [1:Medium] [N/A:N/A] Exudate A mount: [1:Serosanguineous] [N/A:N/A] Exudate Type: [1:red, brown] [N/A:N/A] Exudate Color: [1:Well defined, not attached] [N/A:N/A] Wound Margin: [1:Large (67-100%)] [N/A:N/A] Granulation A mount: [1:Pink] [N/A:N/A] Granulation Quality: [1:Small (1-33%)] [N/A:N/A] Necrotic A mount: [1:Fat Layer (Subcutaneous Tissue): Yes N/A] Exposed Structures: [1:Fascia: No Tendon: No Muscle: No Joint: No Bone: No Medium (34-66%)] [N/A:N/A] Treatment Notes Electronic Signature(s) Signed: 02/29/2020 4:32:00 PM By: Linton Ham MD Signed: 02/29/2020 4:54:48 PM By: Baruch Gouty RN, [name] Entered By: Linton Ham on 02/29/2020 16:28:34 -------------------------------------------------------------------------------- Multi-Disciplinary Care Plan Details Patient Name: Date of Service: [place] DAWNA, [name] 02/29/2020 3:30 PM Medical Record Number: [id number] Patient Account Number: [id number] Date of Birth/Sex: Treating RN: 08-Jan-1965 (55 y.o. [name] Primary Care : [name] Other Clinician: Referring : Treating /Extender: Sonda Primes in  Treatment: 26 Multidisciplinary Care Plan reviewed with physician Active Inactive Abuse / Safety / Falls / Self Care Management Nursing Diagnoses: Potential for falls Potential for injury related to falls Goals: Patient will not experience any injury related to falls Date Initiated: 08/30/2019 Target Resolution Date: 03/28/2020 Goal Status: Active Patient/caregiver will verbalize/demonstrate measures taken to prevent injury and/or falls Date Initiated: 08/30/2019 Date Inactivated: 09/27/2019 Target Resolution Date: 09/28/2019 Goal Status: Met Interventions: Assess Activities of Daily Living upon admission and as needed Assess fall risk on admission and as needed Assess: immobility, friction, shearing, incontinence upon admission and as needed Assess impairment of mobility on admission and as needed per policy Assess personal safety and home safety (as indicated) on admission and as needed Assess self care needs on admission and as needed Provide education on fall prevention Provide education on personal and home safety Notes: Nutrition Nursing Diagnoses: Impaired glucose control: actual or potential Potential for alteratiion in Nutrition/Potential for imbalanced nutrition Goals: Patient/caregiver agrees to and verbalizes understanding of need to use nutritional supplements and/or vitamins as prescribed Date Initiated: 08/30/2019 Date Inactivated: 10/11/2019 Target Resolution Date: 10/05/2019 Goal Status: Met Patient/caregiver will maintain therapeutic glucose control Date Initiated: 08/30/2019 Target Resolution Date: 03/28/2020 Goal Status: Active Interventions: Assess HgA1c results as ordered upon admission and as needed Assess patient nutrition upon admission and as needed per policy Provide education on elevated blood sugars and impact on wound healing Provide education on nutrition Treatment Activities: Education provided on Nutrition : 08/30/2019 Notes: Wound/Skin  Impairment Nursing Diagnoses: Impaired tissue integrity Knowledge deficit related to ulceration/compromised skin integrity Goals: Patient/caregiver will verbalize understanding of skin care regimen Date Initiated: 08/30/2019 Target Resolution Date: 03/07/2020 Goal Status: Active Interventions: Assess patient/caregiver ability to obtain necessary supplies Assess patient/caregiver ability to perform ulcer/skin care regimen upon admission and as needed Assess ulceration(s) every visit Provide education on ulcer and skin care Notes: Electronic Signature(s) Signed: 02/29/2020 4:54:48 PM By: Baruch Gouty RN, [name] Entered By: Baruch Gouty on 02/29/2020 16:00:30 -------------------------------------------------------------------------------- Pain Assessment Details Patient Name: Date of Service: Sudie Bailey 02/29/2020 3:30 PM Medical Record Number: [id number] Patient Account Number: [id number] Date of Birth/Sex: Treating RN: 06-12-1965 (55 y.o. [name], [place] Primary Care : [name] Other Clinician: Referring : Treating /Extender: Tonette Bihari, [name] in Treatment: 26 Active Problems Location of Pain Severity and Description of Pain Patient Has Paino No Site Locations Pain Management and Medication Current Pain Management: Electronic Signature(s) Signed: 02/29/2020 5:08:13 PM By: Rhae Hammock RN Entered By: Rhae Hammock on 02/29/2020 15:45:25 -------------------------------------------------------------------------------- Patient/Caregiver Education Details Patient Name: Date of Service: [place] [name] 2/25/2022andnbsp3:30 PM Medical  Record Number: [id number] Patient Account Number: [id number] Date of Birth/Gender: Treating RN: [birth year] (55 y.o. [name] Primary Care Physician: [name] Other Clinician: Referring Physician: Treating Physician/Extender: Sonda Primes in Treatment: 26 Education Assessment Education Provided To: Patient Education Topics Provided Elevated Blood Sugar/ Impact on Healing: Methods: Explain/Verbal Responses: Reinforcements needed, State content correctly Wound/Skin Impairment: Methods: Explain/Verbal Responses: Reinforcements needed, State content correctly Electronic Signature(s) Signed: 02/29/2020 4:54:48 PM By: Baruch Gouty RN, [name] Entered By: Baruch Gouty on 02/29/2020 16:01:27 -------------------------------------------------------------------------------- Wound Assessment Details Patient Name: Date of Service: [place] Cheryl, Wolf 02/29/2020 3:30 PM Medical Record Number: [id number] Patient Account Number: [id number] Date of Birth/Sex: Treating RN: July 27, 1965 (55 y.o. [name], [place] Primary Care : [name] Other Clinician: Referring : Treating /Extender: Tonette Bihari, [name] in Treatment: 26 Wound Status Wound Number: 1 Primary Diabetic Wound/Ulcer of the Lower Extremity Etiology: Wound Location: Left, Proximal, Plantar Foot Wound Open Wounding Event: Blister Status: Date Acquired: 08/02/2019 Comorbid Cataracts, Anemia, Congestive Heart Failure, Hypertension, Type Weeks Of Treatment: 26 History: II Diabetes, Osteomyelitis, Neuropathy Clustered Wound: No Photos Wound Measurements Length: (cm) 0.4 Width: (cm) 1.7 Depth: (cm) 0.2 Area: (cm) 0.534 Volume: (cm) 0.107 % Reduction in Area: 87.9% % Reduction in Volume: 99% Epithelialization: Medium (34-66%) Tunneling: No Undermining: No Wound Description Classification: Grade 2 Wound Margin: Well defined, not attached Exudate Amount: Medium Exudate Type: Serosanguineous Exudate Color: red, brown Foul Odor After Cleansing: No Slough/Fibrino Yes Wound Bed Granulation Amount: Large (67-100%) Exposed Structure Granulation Quality: Pink Fascia Exposed: No Necrotic Amount: Small  (1-33%) Fat Layer (Subcutaneous Tissue) Exposed: Yes Necrotic Quality: Adherent Slough Tendon Exposed: No Muscle Exposed: No Joint Exposed: No Bone Exposed: No Treatment Notes Wound #1 (Foot) Wound Laterality: Plantar, Left, Proximal Cleanser Peri-Wound Care Sween Lotion (Moisturizing lotion) Discharge Instruction: Apply moisturizing lotion or equivalent moisterizing lotion to foot and leg with dressing changes Topical Primary Dressing Promogran Prisma Matrix, 4.34 (sq in) (silver collagen) Discharge Instruction: Moisten collagen with saline or hydrogel Secondary Dressing Woven Gauze Sponge, Non-Sterile 4x4 in Discharge Instruction: Apply over primary dressing as directed. Optifoam Non-Adhesive Dressing, 4x4 in Discharge Instruction: Foam to first met head for protection Secured With Compression Wrap Kerlix Roll 4.5x3.1 (in/yd) Discharge Instruction: Apply Kerlix and Coban compression as directed. Coban Self-Adherent Wrap 4x5 (in/yd) Discharge Instruction: Apply over Kerlix as directed. Compression Stockings Add-Ons Electronic Signature(s) Signed: 03/04/2020 8:29:17 AM By: Sandre Kitty Signed: 03/04/2020 5:58:27 PM By: Rhae Hammock RN Previous Signature: 02/29/2020 5:08:13 PM Version By: Rhae Hammock RN Entered By: Sandre Kitty on 03/03/2020 16:51:19 -------------------------------------------------------------------------------- Vitals Details Patient Name: Date of Service: [place] Cheryl, Wolf 02/29/2020 3:30 PM Medical Record Number: [id number] Patient Account Number: [id number] Date of Birth/Sex: Treating RN: 04-04-1965 (55 y.o. [name], Lauren Primary Care : Other Clinician: [name] Referring : Treating /Extender: Tonette Bihari, [name] in Treatment: 26 Vital Signs Time Taken: 15:44 Temperature (F): 98.3 Height (in): 67 Pulse (bpm): 78 Weight (lbs): 200 Respiratory Rate (breaths/min): 14 Body  Mass Index (BMI): 31.3 Blood Pressure (mmHg): 149/74 Reference Range: 80 - 120 mg / dl Electronic Signature(s) Signed: 02/29/2020 5:08:13 PM By: Rhae Hammock RN Entered By: Rhae Hammock on 02/29/2020 15:45:19

## 2020-02-29 NOTE — Progress Notes (Signed)
[name], [name] ([id number]) Visit Report for 02/29/2020 HPI Details Patient Name: Date of Service: [place] ODALIS, [name] 02/29/2020 3:30 PM Medical Record Number: [id number] Patient Account Number: [id number] Date of Birth/Sex: Treating RN: 27-Jan-1965 (55 y.o. [name] Primary Care Provider: Dionisio Wolf Other Clinician: Referring Provider: Treating Provider/Extender: Tonette Bihari, [name] in Treatment: 26 History of Present Illness HPI Description: 28ADMISSION 08/30/2019 This is a 55 year old woman with type 2 diabetes and peripheral neuropathy. She developed a blister on the plantar left foot on 7/27. She was seen in the emergency room treated. This did not get any better and she required admission to the hospital from 08/02/2019 through 08/09/2019. She was felt to have cellulitis of the left foot. She was treated with IV medications/antibiotics discharged on Augmentin however she could not tolerate this and she was changed to doxycycline for 5 days she has completed this. She has advanced home care they are apparently putting Xeroform over the wound. When she left the hospital she was supposed to be putting Santyl over the black areas on the left plantar foot covered with Xeroform. X-rays done in the hospital did not show evidence of osteomyelitis only a superficial plantar ulceration. She had blood cultures but I do not see a wound culture. The patient has a past medical history of a right below-knee amputation by Dr. Doran Wolf in 2012 presumably for infection in this area although I am not certain. As noted she is a type II diabetic on oral agents with a recent hemoglobin A1c of 5.5. Other past medical history includes ITP, peripheral edema, chronic kidney disease stage III, hypertension, right below-knee amputation, diastolic heart failure. She tells me she is wheelchair-bound and is nonweightbearing although she does push the wheelchair along with the left foot. She  is only using hospital sock on the left foot. ABI in our clinic was 1.06 on the left 9/7; the patient cultured Pseudomonas last week out of the deeper proximal wound. MRI that was ordered showed progressive soft tissue ulceration along the plantar aspect of the foot especially along the medial arch but also plantar to the first metatarsal phalangeal joint no drainable abscess or foreign body. There was a suspicion for early osteomyelitis in the fifth metatarsal base but she does not have a wound in this area. Also problematically is that where we put her prescription for ciprofloxacin and they did not have the antibiotic and we verified that they are out of ciprofloxacin. We did not receive the message from them to this effect The patient did see Dr. Sherryle Wolf of podiatry. The wound was debrided. He prescribed Santyl which I do not think she is picked up either. Lab work including a CBC sedimentation rate and C-reactive protein were ordered. Finally she is using silver alginate she has home health changing the dressing 3 times a week she does not disturb this in between times 9/23; we have not seen this lady in 2-1/2 weeks largely because of transportation issues. She still has 3 wound areas a deep area in the midfoot and area just above this and then an area on the first met head. She has been using silver alginate on the wound. They are trying to use home health however they are trying to teach her how to do this at be been very difficult for this patient to do so. In discussing things with her she uses this foot to propel her wheelchair I have asked her to stop doing this and to use her  arms. She states that she is only moving around in her house. We will need to have a look at her lab work that I quoted from the last time she was here that was ordered by Dr. Sherryle Wolf of podiatry. By our MRI she does not have osteomyelitis in any of the wounded areas. 10/7 2-week follow-up. The patient has 3 wounds  including superficially over the left first metatarsal head, left first submetatarsal in the lateral foot and a deep area more distally. Her MRI did not show osteomyelitis in reference to the wounds that we are treating although did suggest an early possibility in the left fifth metatarsal head however there is no wound in this area. I have reviewed her inflammatory markers. Most recently on 7/26 she had a sedimentation rate of 117 although this was previously greater than 140 and then 108. Last CRP was on 8/20 at 5.1 comparing to previously at 4.27. Noteworthy that this is when she was in the hospital in July I believe. 10/19; patient missed her appointment last week because of transportation issues. We have been using silver collagen. She still has the area over the metatarsal head and the mid part of her foot which are superficial wounds about the same. Marked deterioration in the problematic distal wound. This now is right down into the fascial layer of her foot. Our intake nurse noted purulent drainage. She has a small satellite lesion towards the heel 10/28; PCR culture of the major wound she has on her proximal plantar foot grew both Peptostreptococcus and Corynebacterium. The Corynebacterium could easily be a skin contaminant but Peptostreptococcus deserve treatment and I started her on Augmentin she arrives in clinic today with the wound looking a lot better 11/5; Finishing her augmentin. Patient's foot is not doing well. Now with a probing area laterally of the major wound going laterally. she presents with a new wound on the lateral part of the left foot with purulent drainage. this tunnels widely and toward the plantar foot. It does not connect with the major plantar wound however I am concerned 11/12; Wound on the left lateral foot that had some purulent drainage last week I cultured and that was negative although she was still on Augmentin at that time which may have affected the  culture. Other than that she has the same 3 areas on the plantar part of the left foot including one over the third metatarsal head, a deep area proximally which has always been her largest wound and one in between. The one in the third metatarsal head and one just distal to this of always been superficial where she has a deep wound in the proximal part of the foot. Today the lateral part of the deeper wound is not nearly ass easy to probe this had a considerable distance last week I told the patient that really the approach this is going to depend on the result of the MRI which is on Monday. If she has an underlying abscess and/or osteomyelitis she will need serious consideration of amputation in this patient who is nonambulatory plus or minus foot surgery and IV antibiotics. We really have not made a lot of progress with her original wounds. 12/3; surprisingly the patient's MRI did not show osteomyelitis but an extensive cellulitis soft tissue infection. With some difficulty 10 days ago we managed to get her on Levaquin and Flagyl. The patient arrives back in clinic today with her 3 original wounds. The area on the lateral foot has closed. 12/17;  2-week follow-up. She is completing her Levaquin should have another 2 days. No additional antibiotics are felt to be necessary. The tunneling area to the lateral part of her foot is closed over. Of the 3 wounds we have been dealing with the area over the met head is fully epithelialized. The middle area it is still eschared and the major distal area is heavily surrounded by thick callus 1/6; she completed her antibiotics has not been here in 3 weeks however. We have been using silver alginate. 2 remaining wounds on the plantar foot she is a diabetic. She does not have arterial issues. MRI did not show osteomyelitis but extensive cellulitis and soft tissue infection. 1/27; 3-week follow-up. She has home health who called [place] to report that her medications  were mixed up she apparently has rectified the situation. We have been using silver collagen to the 2 remaining areas on her foot 2/11; the patient is down to the 1 wound most towards her plantar heel. The rest of this is closed. She has been using silver collagen and changing the dressing herself 2/25; the area towards her plantar heel in the midfoot is much better much smaller. She has been using silver collagen she has home health coming up to change the dressing Monday Wednesday and she is seen here on Friday. Doing remarkably well Electronic Signature(s) Signed: 02/29/2020 4:32:00 PM By: Linton Ham MD Entered By: Linton Ham on 02/29/2020 16:29:20 -------------------------------------------------------------------------------- Physical Exam Details Patient Name: Date of Service: [place] CHANEL, MCADAMS 02/29/2020 3:30 PM Medical Record Number: [id number] Patient Account Number: [id number] Date of Birth/Sex: Treating RN: 1965/01/21 (55 y.o. [name] Primary Care Provider: Dionisio Wolf Other Clinician: Referring Provider: Treating Provider/Extender: Tonette Bihari, [name] in Treatment: 26 Constitutional Patient is hypertensive.. Pulse regular and within target range for patient.Marland Kitchen Respirations regular, non-labored and within target range.. Temperature is normal and within the target range for the patient.Marland Kitchen Appears in no distress. Notes Wound exam; her other 2 remaining wound sites remain closed. This is in the distal plantar midfoot. Much better surface area. No debridement is necessary. Everything looks clean no surrounding infection Electronic Signature(s) Signed: 02/29/2020 4:32:00 PM By: Linton Ham MD Entered By: Linton Ham on 02/29/2020 16:30:05 -------------------------------------------------------------------------------- Physician Orders Details Patient Name: Date of Service: [place] KIRSTYN, LEAN 02/29/2020 3:30 PM Medical Record Number:  [id number] Patient Account Number: [id number] Date of Birth/Sex: Treating RN: 10-May-1965 (55 y.o. 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 Bathing/ Shower/ Hygiene May shower with protection but do not get wound dressing(s) wet. Edema Control - Lymphedema / SCD / Other Left Lower Extremity Elevate legs to the level of the heart or above for 30 minutes daily and/or when sitting, a frequency of: - throughout the day Off-Loading Other: - minimal weight bearing left foot, try not to navigate wheelchair with left foot Home Health No change in wound care orders this week; continue Home Health for wound care. May utilize formulary equivalent dressing for wound treatment orders unless otherwise specified. Dressing changes to be completed by Home Health on Monday / Wednesday /  Friday except when patient has scheduled visit at [facility]. Other Home Health Orders/Instructions: - Advanced Wound Treatment Wound #1 - Foot Wound Laterality: Plantar, Left, Proximal Peri-Wound Care: Sween Lotion (Moisturizing lotion) (Home Health) 3 x Per Week/30 Days Discharge Instructions: Apply moisturizing lotion or equivalent moisterizing lotion to foot and leg with dressing changes Prim Dressing: Promogran Prisma Matrix, 4.34 (sq in) (silver collagen) (Home Health) 3 x Per Week/30 Days ary Discharge Instructions: Moisten collagen with saline or hydrogel Secondary Dressing: Woven Gauze Sponge, Non-Sterile  4x4 in (Home Health) 3 x Per Week/30 Days Discharge Instructions: Apply over primary dressing as directed. Secondary Dressing: Optifoam Non-Adhesive Dressing, 4x4 in (Home Health) 3 x Per Week/30 Days Discharge Instructions: Foam to first met head for protection Compression Wrap: Kerlix Roll 4.5x3.1 (in/yd) (Home Health) 3 x Per Week/30 Days Discharge Instructions: Apply Kerlix and Coban compression as directed. Compression Wrap: Coban Self-Adherent Wrap 4x5 (in/yd) (Home Health) 3 x Per Week/30 Days Discharge Instructions: Apply over Kerlix as directed. Electronic Signature(s) Signed: 02/29/2020 4:32:00 PM By: Linton Ham MD Signed: 02/29/2020 5:08:13 PM By: Rhae Hammock RN Entered By: Rhae Hammock on 02/29/2020 16:17:12 -------------------------------------------------------------------------------- Problem List Details Patient Name: Date of Service: [place] SOLVEIG, FANGMAN 02/29/2020 3:30 PM Medical Record Number: [id number] Patient Account Number: [id number] Date of Birth/Sex: Treating RN: 10/10/1965 (55 y.o. Tonita Phoenix, Lauren Primary Care Provider: Dionisio Wolf Other Clinician: Referring Provider: Treating Provider/Extender: Tonette Bihari, [name] in Treatment: 26 Active Problems ICD-10 Encounter Code Description Active Date MDM Diagnosis E11.621 Type 2 diabetes mellitus with foot ulcer 08/30/2019 No Yes L97.523 Non-pressure chronic ulcer of other part of left foot with necrosis of muscle 08/30/2019 No Yes Z89.511 Acquired absence of right leg below knee 08/30/2019 No Yes M86.672 Other chronic osteomyelitis, left ankle and foot 08/30/2019 No Yes Inactive Problems Resolved Problems Electronic Signature(s) Signed: 02/29/2020 4:32:00 PM By: Linton Ham MD Entered By: Linton Ham on 02/29/2020 16:28:29 -------------------------------------------------------------------------------- Progress Note Details Patient Name: Date of Service: [place] [name] 02/29/2020 3:30 PM Medical Record Number: [id number] Patient Account Number: [id number] Date of Birth/Sex: Treating RN: 08/02/1965 (55 y.o. [name] Primary Care Provider: Dionisio Wolf Other Clinician: Referring Provider: Treating Provider/Extender: Tonette Bihari, [name] in Treatment: 26 Subjective History of Present Illness (HPI) 28ADMISSION 08/30/2019 This is a 55 year old woman with type 2 diabetes and peripheral neuropathy. She developed a blister on the plantar left foot on 7/27. She was seen in the emergency room treated. This did not get any better and she required admission to the hospital from 08/02/2019 through 08/09/2019. She was felt to have cellulitis of the left foot. She was treated with IV medications/antibiotics discharged on Augmentin however she could not tolerate this and she was changed to doxycycline for 5 days she has completed this. She has advanced home care they are apparently putting Xeroform over the wound. When she left the hospital she was supposed to be putting Santyl over the black areas on the left plantar foot covered with Xeroform. X-rays done in the hospital did not show evidence of osteomyelitis only a superficial plantar ulceration. She had blood cultures but I do not see a wound culture. The patient has a past medical history of a right below-knee amputation by Dr. Doran Wolf in 2012 presumably for infection in this area although I am not certain. As noted she is a type II diabetic on oral agents with a recent hemoglobin A1c of 5.5. Other past medical history includes ITP, peripheral  edema, chronic kidney disease stage III, hypertension, right below-knee amputation, diastolic heart failure. She tells me she is wheelchair-bound and is nonweightbearing although she does push the wheelchair along with the left foot. She is only using hospital sock on the left foot. ABI in our clinic was 1.06 on the left 9/7; the patient cultured  Pseudomonas last week out of the deeper proximal wound. MRI that was ordered showed progressive soft tissue ulceration along the plantar aspect of the foot especially along the medial arch but also plantar to the first metatarsal phalangeal joint no drainable abscess or foreign body. There was a suspicion for early osteomyelitis in the fifth metatarsal base but she does not have a wound in this area. Also problematically is that where we put her prescription for ciprofloxacin and they did not have the antibiotic and we verified that they are out of ciprofloxacin. We did not receive the message from them to this effect The patient did see Dr. Sherryle Wolf of podiatry. The wound was debrided. He prescribed Santyl which I do not think she is picked up either. Lab work including a CBC sedimentation rate and C-reactive protein were ordered. Finally she is using silver alginate she has home health changing the dressing 3 times a week she does not disturb this in between times 9/23; we have not seen this lady in 2-1/2 weeks largely because of transportation issues. She still has 3 wound areas a deep area in the midfoot and area just above this and then an area on the first met head. She has been using silver alginate on the wound. They are trying to use home health however they are trying to teach her how to do this at be been very difficult for this patient to do so. In discussing things with her she uses this foot to propel her wheelchair I have asked her to stop doing this and to use her arms. She states that she is only moving around in her house. We will need to have a look at her lab work that I quoted from the last time she was here that was ordered by Dr. Sherryle Wolf of podiatry. By our MRI she does not have osteomyelitis in any of the wounded areas. 10/7 2-week follow-up. The patient has 3 wounds including superficially over the left first metatarsal head, left first submetatarsal in the lateral foot and a  deep area more distally. Her MRI did not show osteomyelitis in reference to the wounds that we are treating although did suggest an early possibility in the left fifth metatarsal head however there is no wound in this area. I have reviewed her inflammatory markers. Most recently on 7/26 she had a sedimentation rate of 117 although this was previously greater than 140 and then 108. Last CRP was on 8/20 at 5.1 comparing to previously at 4.27. Noteworthy that this is when she was in the hospital in July I believe. 10/19; patient missed her appointment last week because of transportation issues. We have been using silver collagen. She still has the area over the metatarsal head and the mid part of her foot which are superficial wounds about the same. Marked deterioration in the problematic distal wound. This now is right down into the fascial layer of her foot. Our intake nurse noted purulent drainage. She has a small satellite lesion towards the heel 10/28; PCR culture of the major wound she has on her proximal plantar foot grew both Peptostreptococcus and Corynebacterium. The Corynebacterium could easily be a  skin contaminant but Peptostreptococcus deserve treatment and I started her on Augmentin she arrives in clinic today with the wound looking a lot better 11/5; Finishing her augmentin. Patient's foot is not doing well. Now with a probing area laterally of the major wound going laterally. she presents with a new wound on the lateral part of the left foot with purulent drainage. this tunnels widely and toward the plantar foot. It does not connect with the major plantar wound however I am concerned 11/12; Wound on the left lateral foot that had some purulent drainage last week I cultured and that was negative although she was still on Augmentin at that time which may have affected the culture. Other than that she has the same 3 areas on the plantar part of the left foot including one over the third  metatarsal head, a deep area proximally which has always been her largest wound and one in between. The one in the third metatarsal head and one just distal to this of always been superficial where she has a deep wound in the proximal part of the foot. Today the lateral part of the deeper wound is not nearly ass easy to probe this had a considerable distance last week I told the patient that really the approach this is going to depend on the result of the MRI which is on Monday. If she has an underlying abscess and/or osteomyelitis she will need serious consideration of amputation in this patient who is nonambulatory plus or minus foot surgery and IV antibiotics. We really have not made a lot of progress with her original wounds. 12/3; surprisingly the patient's MRI did not show osteomyelitis but an extensive cellulitis soft tissue infection. With some difficulty 10 days ago we managed to get her on Levaquin and Flagyl. The patient arrives back in clinic today with her 3 original wounds. The area on the lateral foot has closed. 12/17; 2-week follow-up. She is completing her Levaquin should have another 2 days. No additional antibiotics are felt to be necessary. The tunneling area to the lateral part of her foot is closed over. Of the 3 wounds we have been dealing with the area over the met head is fully epithelialized. The middle area it is still eschared and the major distal area is heavily surrounded by thick callus 1/6; she completed her antibiotics has not been here in 3 weeks however. We have been using silver alginate. 2 remaining wounds on the plantar foot she is a diabetic. She does not have arterial issues. MRI did not show osteomyelitis but extensive cellulitis and soft tissue infection. 1/27; 3-week follow-up. She has home health who called [place] to report that her medications were mixed up she apparently has rectified the situation. We have been using silver collagen to the 2 remaining areas  on her foot 2/11; the patient is down to the 1 wound most towards her plantar heel. The rest of this is closed. She has been using silver collagen and changing the dressing herself 2/25; the area towards her plantar heel in the midfoot is much better much smaller. She has been using silver collagen she has home health coming up to change the dressing Monday Wednesday and she is seen here on Friday. Doing remarkably well Objective Constitutional Patient is hypertensive.. Pulse regular and within target range for patient.Marland Kitchen Respirations regular, non-labored and within target range.. Temperature is normal and within the target range for the patient.Marland Kitchen Appears in no distress. Vitals Time Taken: 3:44 PM, Height: 67  in, Weight: 200 lbs, BMI: 31.3, Temperature: 98.3 F, Pulse: 78 bpm, Respiratory Rate: 14 breaths/min, Blood Pressure: 149/74 mmHg. General Notes: Wound exam; her other 2 remaining wound sites remain closed. This is in the distal plantar midfoot. Much better surface area. No debridement is necessary. Everything looks clean no surrounding infection Integumentary (Hair, Skin) Wound #1 status is Open. Original cause of wound was Blister. The date acquired was: 08/02/2019. The wound has been in treatment 26 weeks. The wound is located on the Left,Proximal,Plantar Foot. The wound measures 0.4cm length x 1.7cm width x 0.2cm depth; 0.534cm^2 area and 0.107cm^3 volume. There is Fat Layer (Subcutaneous Tissue) exposed. There is no tunneling or undermining noted. There is a medium amount of serosanguineous drainage noted. The wound margin is well defined and not attached to the wound base. There is large (67-100%) pink granulation within the wound bed. There is a small (1-33%) amount of necrotic tissue within the wound bed including Adherent Slough. Assessment Active Problems ICD-10 Type 2 diabetes mellitus with foot ulcer Non-pressure chronic ulcer of other part of left foot with necrosis of  muscle Acquired absence of right leg below knee Other chronic osteomyelitis, left ankle and foot Plan Follow-up Appointments: Return appointment in 3 weeks. Bathing/ Shower/ Hygiene: May shower with protection but do not get wound dressing(s) wet. Edema Control - Lymphedema / SCD / Other: Elevate legs to the level of the heart or above for 30 minutes daily and/or when sitting, a frequency of: - throughout the day Off-Loading: Other: - minimal weight bearing left foot, try not to navigate wheelchair with left foot Home Health: No change in wound care orders this week; continue Home Health for wound care. May utilize formulary equivalent dressing for wound treatment orders unless otherwise specified. Dressing changes to be completed by [place] on Monday / Wednesday / Friday except when patient has scheduled visit at [facility]. Other Home Health Orders/Instructions: - Advanced WOUND #1: - Foot Wound Laterality: Plantar, Left, Proximal Peri-Wound Care: Sween Lotion (Moisturizing lotion) (Home Health) 3 x Per Week/30 Days Discharge Instructions: Apply moisturizing lotion or equivalent moisterizing lotion to foot and leg with dressing changes Prim Dressing: Promogran Prisma Matrix, 4.34 (sq in) (silver collagen) (Home Health) 3 x Per Week/30 Days ary Discharge Instructions: Moisten collagen with saline or hydrogel Secondary Dressing: Woven Gauze Sponge, Non-Sterile 4x4 in (Home Health) 3 x Per Week/30 Days Discharge Instructions: Apply over primary dressing as directed. Secondary Dressing: Optifoam Non-Adhesive Dressing, 4x4 in (Home Health) 3 x Per Week/30 Days Discharge Instructions: Foam to first met head for protection Com pression Wrap: Kerlix Roll 4.5x3.1 (in/yd) (Home Health) 3 x Per Week/30 Days Discharge Instructions: Apply Kerlix and Coban compression as directed. Com pression Wrap: Coban Self-Adherent Wrap 4x5 (in/yd) (Home Health) 3 x Per Week/30 Days Discharge  Instructions: Apply over Kerlix as directed. 1. This patient will continue with the silver collagen-based dressings 2. She has home health changing the dressing which I am not sure I knew. They can do this 3 times for the next 2 weeks and we will see her again in 3 weeks as this seems to be making progressive improvements in surface area and wound condition 3. I told her she can start working on her stump vis--vis a prosthesis. Electronic Signature(s) Signed: 02/29/2020 4:32:00 PM By: Linton Ham MD Entered By: Linton Ham on 02/29/2020 16:30:55 -------------------------------------------------------------------------------- SuperBill Details Patient Name: Date of Service: Sudie Bailey 02/29/2020 Medical Record Number: [id number] Patient Account Number: [id number]  Date of Birth/Sex: Treating RN: 1965/10/11 (55 y.o. Tonita Phoenix, Lauren Primary Care Provider: Dionisio Wolf Other Clinician: Referring Provider: Treating Provider/Extender: Tonette Bihari, [name] in Treatment: 26 Diagnosis Coding ICD-10 Codes Code Description E11.621 Type 2 diabetes mellitus with foot ulcer L97.523 Non-pressure chronic ulcer of other part of left foot with necrosis of muscle Z89.511 Acquired absence of right leg below knee M86.672 Other chronic osteomyelitis, left ankle and foot Facility Procedures CPT4 Code: 73220254 Description: 99213 - WOUND CARE VISIT-LEV 3 EST PT Modifier: Quantity: 1 Physician Procedures : CPT4 Code Description Modifier [id number] - WC PHYS LEVEL 3 - EST PT ICD-10 Diagnosis Description E11.621 Type 2 diabetes mellitus with foot ulcer L97.523 Non-pressure chronic ulcer of other part of left foot with necrosis of muscle Quantity: 1 Electronic Signature(s) Signed: 02/29/2020 4:32:00 PM By: Linton Ham MD Entered By: Linton Ham on 02/29/2020 16:31:16

## 2020-02-29 NOTE — Telephone Encounter (Signed)
Scheduled appt per 2/25 sch msg - unable to reach pt . Left message for patient with appt date and time

## 2020-03-02 NOTE — Assessment & Plan Note (Signed)
Relapsed Refractory ITP: Relapsing ITP: Patient response to steroids and IVIG but because she is a diabetic we cannot use steroids. Normally responds to IV IG Prior IVIG: March 2021, April 2021, June 2021, Feb 2022 Lab review: Platelet count 16 on 05/16/2019 ----------------------------------------------------------------------------------------------------------------------------------------------------------------- Prior treatment: Rituxan weekly x4 started 05/23/2019 (patient missed her treatment on 05/30/2019)today is third treatment (patient missed multiple appointments)  Hospitalization: 06/25/2019-06/29/2019: Nausea and vomiting Hospitalization 08/02/2019-08/09/2019 cellulitis left foot  08/08/2019: Platelets 301, hemoglobin 8.6 09/05/2019: Platelets 110, hemoglobin 11.5

## 2020-03-02 NOTE — Progress Notes (Signed)
  HEMATOLOGY-ONCOLOGY TELEPHONE VISIT PROGRESS NOTE  I connected with [name] on 03/03/2020 at 11:45 AM EST by telephone and verified that I am speaking with the correct person using two identifiers.  I discussed the limitations, risks, security and privacy concerns of performing an evaluation and management service by telephone and the availability of in person appointments.  I also discussed with the patient that there may be a patient responsible charge related to this service. The patient expressed understanding and agreed to proceed.   History of Present Illness: [name] is a 55 y.o. female with above-mentioned history of ITP who received 2 doses of IVIG on 2/7 and 2/8.She presents over the phone todayforfollow-up.  Observations/Objective:     Assessment Plan:  Idiopathic thrombocytopenic purpura (ITP) (HCC) Relapsed Refractory ITP: Relapsing ITP: Patient response to steroids and IVIG but because she is a diabetic we cannot use steroids. Normally responds to IV IG Prior IVIG: March 2021, April 2021, June 2021, Feb 2022 Lab review: Platelet count 16 on 05/16/2019 ----------------------------------------------------------------------------------------------------------------------------------------------------------------- Prior treatment: Rituxan weekly x4 started 05/23/2019 (patient missed her treatment on 05/30/2019)today is third treatment (patient missed multiple appointments)  Hospitalization: 06/25/2019-06/29/2019: Nausea and vomiting Hospitalization 08/02/2019-08/09/2019 cellulitis left foot  08/08/2019: Platelets 301, hemoglobin 8.6 09/05/2019: Platelets 110, hemoglobin 11.5 02/28/2020: Platelets 14, hemoglobin 10.1, creatinine 1.63, potassium 6.3 (this is after 2 doses of IVIG) I discussed with her that she might require 2 additional dose of IVIG or consider thrombopoietin my medics like Promacta.  Hypokalemia: I discussed with her that she might need to go to  the emergency room for further evaluation and treatment.  Alternatively we can treat her with the oral Kayexalate and monitor closely. She did not want to go to emergency room.  She wanted to be tested and labs reviewed. We contacted home health or willing to draw her labs today or tomorrow.  We will plan further treatments based upon the results of lab work.  I discussed the assessment and treatment plan with the patient. The patient was provided an opportunity to ask questions and all were answered. The patient agreed with the plan and demonstrated an understanding of the instructions. The patient was advised to call back or seek an in-person evaluation if the symptoms worsen or if the condition fails to improve as anticipated.   Total time spent: 30 mins including non-face to face time and time spent for planning, charting and coordination of care  Sabas Sous, MD 03/03/2020    I, Kirt Boys Dorshimer, am acting as scribe for Serena Croissant, MD.  I have reviewed the above documentation for accuracy and completeness, and I agree with the above.

## 2020-03-03 ENCOUNTER — Encounter: Payer: Self-pay | Admitting: *Deleted

## 2020-03-03 ENCOUNTER — Inpatient Hospital Stay (HOSPITAL_BASED_OUTPATIENT_CLINIC_OR_DEPARTMENT_OTHER): Payer: Medicare Other | Admitting: Hematology and Oncology

## 2020-03-03 DIAGNOSIS — D693 Immune thrombocytopenic purpura: Secondary | ICD-10-CM | POA: Diagnosis not present

## 2020-03-03 NOTE — Progress Notes (Signed)
Per MD pt needing repeat CBC and CMP to evaluate hyperkalemia.  RN placed call to Maralyn Sago [facility], [place] RN [phone]) and gave verbal orders for pt to have labs drawn today or tomorrow and to fax results to our office.  Sarah verbalized understanding of orders.

## 2020-03-06 ENCOUNTER — Encounter: Payer: Self-pay | Admitting: *Deleted

## 2020-03-06 DIAGNOSIS — I5022 Chronic systolic (congestive) heart failure: Secondary | ICD-10-CM | POA: Diagnosis not present

## 2020-03-06 DIAGNOSIS — I10 Essential (primary) hypertension: Secondary | ICD-10-CM | POA: Diagnosis not present

## 2020-03-06 DIAGNOSIS — E1165 Type 2 diabetes mellitus with hyperglycemia: Secondary | ICD-10-CM | POA: Diagnosis not present

## 2020-03-06 DIAGNOSIS — Z89511 Acquired absence of right leg below knee: Secondary | ICD-10-CM | POA: Diagnosis not present

## 2020-03-06 NOTE — Progress Notes (Signed)
Received call from Danita RN with [facility] [phone]) stating she was not able to draw labs today.  Requesting verbal order for stat CBC and CMP.  Per MD verbal orders received for [facility] RN to attempt to draw STAT CBC and CMP tomorrow.  Per MD if STAT labs are not able to be drawn, pt needing to go to the Emergency Department this weekend for evaluation and treatment of hyperkalemia.  Pt notified and verbalized understanding of plan.

## 2020-03-07 ENCOUNTER — Telehealth: Payer: Self-pay

## 2020-03-07 ENCOUNTER — Encounter: Payer: Self-pay | Admitting: Hematology and Oncology

## 2020-03-07 DIAGNOSIS — E875 Hyperkalemia: Secondary | ICD-10-CM | POA: Diagnosis not present

## 2020-03-07 DIAGNOSIS — D696 Thrombocytopenia, unspecified: Secondary | ICD-10-CM | POA: Diagnosis not present

## 2020-03-07 NOTE — Telephone Encounter (Signed)
RN received lab report from [facility], RN reviewed with MD.   Platelets 5; Creatinine 1.66; Potassium 6.2.   Pt with history of ITP.    MD recommendations for patient to report to ED for evaluation.    RN notified patient, patient verbalized understanding and agreement.

## 2020-03-08 ENCOUNTER — Inpatient Hospital Stay (HOSPITAL_COMMUNITY)
Admission: EM | Admit: 2020-03-08 | Discharge: 2020-03-11 | DRG: 813 | Disposition: A | Discharge status: Home Health | Payer: Medicare Other | Attending: Internal Medicine | Admitting: Internal Medicine

## 2020-03-08 ENCOUNTER — Other Ambulatory Visit: Payer: Self-pay

## 2020-03-08 ENCOUNTER — Emergency Department (HOSPITAL_COMMUNITY): Payer: Medicare Other

## 2020-03-08 ENCOUNTER — Encounter (HOSPITAL_COMMUNITY): Payer: Self-pay | Admitting: Emergency Medicine

## 2020-03-08 ENCOUNTER — Other Ambulatory Visit (HOSPITAL_COMMUNITY): Payer: Self-pay

## 2020-03-08 DIAGNOSIS — I130 Hypertensive heart and chronic kidney disease with heart failure and stage 1 through stage 4 chronic kidney disease, or unspecified chronic kidney disease: Secondary | ICD-10-CM | POA: Diagnosis present

## 2020-03-08 DIAGNOSIS — Z7984 Long term (current) use of oral hypoglycemic drugs: Secondary | ICD-10-CM

## 2020-03-08 DIAGNOSIS — E1169 Type 2 diabetes mellitus with other specified complication: Secondary | ICD-10-CM | POA: Diagnosis not present

## 2020-03-08 DIAGNOSIS — I10 Essential (primary) hypertension: Secondary | ICD-10-CM | POA: Diagnosis not present

## 2020-03-08 DIAGNOSIS — R0602 Shortness of breath: Secondary | ICD-10-CM

## 2020-03-08 DIAGNOSIS — E1121 Type 2 diabetes mellitus with diabetic nephropathy: Secondary | ICD-10-CM | POA: Diagnosis not present

## 2020-03-08 DIAGNOSIS — J9621 Acute and chronic respiratory failure with hypoxia: Secondary | ICD-10-CM | POA: Diagnosis not present

## 2020-03-08 DIAGNOSIS — Z7982 Long term (current) use of aspirin: Secondary | ICD-10-CM | POA: Diagnosis not present

## 2020-03-08 DIAGNOSIS — I5031 Acute diastolic (congestive) heart failure: Secondary | ICD-10-CM | POA: Diagnosis present

## 2020-03-08 DIAGNOSIS — Z20822 Contact with and (suspected) exposure to covid-19: Secondary | ICD-10-CM | POA: Diagnosis not present

## 2020-03-08 DIAGNOSIS — Z89511 Acquired absence of right leg below knee: Secondary | ICD-10-CM | POA: Diagnosis not present

## 2020-03-08 DIAGNOSIS — N183 Chronic kidney disease, stage 3 unspecified: Secondary | ICD-10-CM | POA: Diagnosis present

## 2020-03-08 DIAGNOSIS — Z23 Encounter for immunization: Secondary | ICD-10-CM

## 2020-03-08 DIAGNOSIS — E876 Hypokalemia: Secondary | ICD-10-CM | POA: Diagnosis not present

## 2020-03-08 DIAGNOSIS — Z9851 Tubal ligation status: Secondary | ICD-10-CM

## 2020-03-08 DIAGNOSIS — Z833 Family history of diabetes mellitus: Secondary | ICD-10-CM | POA: Diagnosis not present

## 2020-03-08 DIAGNOSIS — E1165 Type 2 diabetes mellitus with hyperglycemia: Secondary | ICD-10-CM | POA: Diagnosis present

## 2020-03-08 DIAGNOSIS — E118 Type 2 diabetes mellitus with unspecified complications: Secondary | ICD-10-CM | POA: Diagnosis not present

## 2020-03-08 DIAGNOSIS — Z8249 Family history of ischemic heart disease and other diseases of the circulatory system: Secondary | ICD-10-CM | POA: Diagnosis not present

## 2020-03-08 DIAGNOSIS — Z8673 Personal history of transient ischemic attack (TIA), and cerebral infarction without residual deficits: Secondary | ICD-10-CM

## 2020-03-08 DIAGNOSIS — E1122 Type 2 diabetes mellitus with diabetic chronic kidney disease: Secondary | ICD-10-CM | POA: Diagnosis present

## 2020-03-08 DIAGNOSIS — I517 Cardiomegaly: Secondary | ICD-10-CM | POA: Diagnosis not present

## 2020-03-08 DIAGNOSIS — N179 Acute kidney failure, unspecified: Secondary | ICD-10-CM | POA: Diagnosis present

## 2020-03-08 DIAGNOSIS — I739 Peripheral vascular disease, unspecified: Secondary | ICD-10-CM | POA: Diagnosis not present

## 2020-03-08 DIAGNOSIS — M1990 Unspecified osteoarthritis, unspecified site: Secondary | ICD-10-CM | POA: Diagnosis present

## 2020-03-08 DIAGNOSIS — Z79899 Other long term (current) drug therapy: Secondary | ICD-10-CM

## 2020-03-08 DIAGNOSIS — I5022 Chronic systolic (congestive) heart failure: Secondary | ICD-10-CM | POA: Diagnosis not present

## 2020-03-08 DIAGNOSIS — D693 Immune thrombocytopenic purpura: Secondary | ICD-10-CM | POA: Diagnosis not present

## 2020-03-08 DIAGNOSIS — IMO0002 Reserved for concepts with insufficient information to code with codable children: Secondary | ICD-10-CM | POA: Diagnosis present

## 2020-03-08 DIAGNOSIS — E1151 Type 2 diabetes mellitus with diabetic peripheral angiopathy without gangrene: Secondary | ICD-10-CM | POA: Diagnosis present

## 2020-03-08 DIAGNOSIS — E875 Hyperkalemia: Secondary | ICD-10-CM | POA: Diagnosis not present

## 2020-03-08 DIAGNOSIS — D696 Thrombocytopenia, unspecified: Secondary | ICD-10-CM | POA: Diagnosis not present

## 2020-03-08 DIAGNOSIS — R5383 Other fatigue: Secondary | ICD-10-CM | POA: Diagnosis not present

## 2020-03-08 LAB — CBC
HCT: 30.7 % — ABNORMAL LOW (ref 36.0–46.0)
Hemoglobin: 9.2 g/dL — ABNORMAL LOW (ref 12.0–15.0)
MCH: 31 pg (ref 26.0–34.0)
MCHC: 30 g/dL (ref 30.0–36.0)
MCV: 103.4 fL — ABNORMAL HIGH (ref 80.0–100.0)
Platelets: 7 10*3/uL — CL (ref 150–400)
RBC: 2.97 MIL/uL — ABNORMAL LOW (ref 3.87–5.11)
RDW: 17.6 % — ABNORMAL HIGH (ref 11.5–15.5)
WBC: 4.1 10*3/uL (ref 4.0–10.5)
nRBC: 0.5 % — ABNORMAL HIGH (ref 0.0–0.2)

## 2020-03-08 LAB — BASIC METABOLIC PANEL
Anion gap: 10 (ref 5–15)
BUN: 47 mg/dL — ABNORMAL HIGH (ref 6–20)
CO2: 21 mmol/L — ABNORMAL LOW (ref 22–32)
Calcium: 8.5 mg/dL — ABNORMAL LOW (ref 8.9–10.3)
Chloride: 105 mmol/L (ref 98–111)
Creatinine, Ser: 1.92 mg/dL — ABNORMAL HIGH (ref 0.44–1.00)
GFR, Estimated: 31 mL/min — ABNORMAL LOW (ref >60)
Glucose, Bld: 117 mg/dL — ABNORMAL HIGH (ref 70–99)
Potassium: 6.1 mmol/L — ABNORMAL HIGH (ref 3.5–5.1)
Sodium: 136 mmol/L (ref 135–145)

## 2020-03-08 LAB — LIPID PANEL
Cholesterol: 104 mg/dL (ref 0–200)
HDL: 66 mg/dL (ref >40)
LDL Cholesterol: 28 mg/dL (ref 0–99)
Total CHOL/HDL Ratio: 1.6 RATIO
Triglycerides: 51 mg/dL (ref <150)
VLDL: 10 mg/dL (ref 0–40)

## 2020-03-08 LAB — GLUCOSE, CAPILLARY
Glucose-Capillary: 121 mg/dL — ABNORMAL HIGH (ref 70–99)
Glucose-Capillary: 76 mg/dL (ref 70–99)
Glucose-Capillary: 85 mg/dL (ref 70–99)

## 2020-03-08 LAB — TYPE AND SCREEN
ABO/RH(D): B POS
Antibody Screen: NEGATIVE

## 2020-03-08 LAB — PLATELET COUNT: Platelets: 6 10*3/uL — CL (ref 150–400)

## 2020-03-08 LAB — RESP PANEL BY RT-PCR (FLU A&B, COVID) ARPGX2
Influenza A by PCR: NEGATIVE
Influenza B by PCR: NEGATIVE
SARS Coronavirus 2 by RT PCR: NEGATIVE

## 2020-03-08 LAB — HIV ANTIBODY (ROUTINE TESTING W REFLEX): HIV Screen 4th Generation wRfx: NONREACTIVE

## 2020-03-08 LAB — BRAIN NATRIURETIC PEPTIDE: B Natriuretic Peptide: 824.4 pg/mL — ABNORMAL HIGH (ref 0.0–100.0)

## 2020-03-08 LAB — TROPONIN I (HIGH SENSITIVITY)
Troponin I (High Sensitivity): 13 ng/L (ref <18)
Troponin I (High Sensitivity): 14 ng/L (ref <18)

## 2020-03-08 MED ORDER — ISOSORBIDE MONONITRATE ER 30 MG PO TB24
30.0000 mg | ORAL_TABLET | Freq: Every day | ORAL | Status: DC
  Administered 2020-03-09 – 2020-03-11 (×3): 30 mg via ORAL
  Filled 2020-03-08 (×3): qty 1

## 2020-03-08 MED ORDER — GABAPENTIN 400 MG PO CAPS
400.0000 mg | ORAL_CAPSULE | Freq: Two times a day (BID) | ORAL | Status: DC
  Administered 2020-03-08 – 2020-03-11 (×6): 400 mg via ORAL
  Filled 2020-03-08 (×6): qty 1

## 2020-03-08 MED ORDER — TRAMADOL HCL 50 MG PO TABS: 50.0000 mg | ORAL_TABLET | Freq: Two times a day (BID) | ORAL | Status: DC | PRN

## 2020-03-08 MED ORDER — SODIUM ZIRCONIUM CYCLOSILICATE 10 G PO PACK
10.0000 g | PACK | Freq: Once | ORAL | Status: AC
  Administered 2020-03-08: 10 g via ORAL
  Filled 2020-03-08: qty 1

## 2020-03-08 MED ORDER — ACETAMINOPHEN 650 MG RE SUPP: 650.0000 mg | Freq: Four times a day (QID) | RECTAL | Status: DC | PRN

## 2020-03-08 MED ORDER — COVID-19 MRNA VAC-TRIS(PFIZER) 30 MCG/0.3ML IM SUSP
0.3000 mL | Freq: Once | INTRAMUSCULAR | Status: AC
  Filled 2020-03-08: qty 0.3

## 2020-03-08 MED ORDER — INSULIN ASPART 100 UNIT/ML ~~LOC~~ SOLN
0.0000 [IU] | SUBCUTANEOUS | Status: DC
  Administered 2020-03-08 – 2020-03-10 (×5): 1 [IU] via SUBCUTANEOUS

## 2020-03-08 MED ORDER — ONDANSETRON HCL 4 MG/2ML IJ SOLN: 4.0000 mg | Freq: Four times a day (QID) | INTRAMUSCULAR | Status: DC | PRN

## 2020-03-08 MED ORDER — IMMUNE GLOBULIN (HUMAN) 20 GM/200ML IV SOLN
1.0000 g/kg | INTRAVENOUS | Status: AC
Start: 2020-03-08 — End: 2020-03-09
  Administered 2020-03-08 – 2020-03-09 (×2): 60 g via INTRAVENOUS
  Filled 2020-03-08 (×2): qty 600

## 2020-03-08 MED ORDER — ASPIRIN EC 81 MG PO TBEC: 81.0000 mg | DELAYED_RELEASE_TABLET | Freq: Every day | ORAL | Status: DC | PRN

## 2020-03-08 MED ORDER — ACETAMINOPHEN 325 MG PO TABS: 650.0000 mg | ORAL_TABLET | Freq: Four times a day (QID) | ORAL | Status: DC | PRN

## 2020-03-08 MED ORDER — AMLODIPINE BESYLATE 5 MG PO TABS
5.0000 mg | ORAL_TABLET | Freq: Every day | ORAL | Status: DC
  Administered 2020-03-09 – 2020-03-11 (×3): 5 mg via ORAL
  Filled 2020-03-08 (×3): qty 1

## 2020-03-08 MED ORDER — ONDANSETRON HCL 4 MG PO TABS: 4.0000 mg | ORAL_TABLET | Freq: Four times a day (QID) | ORAL | Status: DC | PRN

## 2020-03-08 NOTE — ED Notes (Signed)
IV team at bedside 

## 2020-03-08 NOTE — H&P (Signed)
[facility] History and Physical  [name] DTO:[id number] DOB: [birth year] DOA: 03/08/2020  Referring physician:  PCP: Patient, No Pcp Per   Chief Complaint: Refractory ITP  HPI: [name] is a 55 y.o. BF PMHx refractory ITP, anemia, DM type II uncontrolled with complication, s/p RIGHT BKA, essential HTN, intracerebral hemorrhage,  Presents today chronic Refractory ITP with prior multiple treatments including dexamethasone, rituximab x4 in May 2021 and missed few infusion appointments and the previous IVIG infusions.  Her last IVIG was given for a total of 95 g given on February 7 and February 12, 2020 after platelet count at that time of 14.  Her platelet count on February 24 was 14 and home agency draw her laboratory testing again with a platelet count of 7 and based on these findings she was referred to the emergency department.   Review of Systems:  Covid vaccination; unvaccinated request vaccination; vaccination requested  Constitutional:  No weight loss, night sweats, Fevers, chills, fatigue.  HEENT:  No headaches, Difficulty swallowing,Tooth/dental problems,Sore throat,  No sneezing, itching, ear ache, nasal congestion, post nasal drip, positive mild, epistaxis last week Cardio-vascular:  No chest pain, Orthopnea, PND, swelling in lower extremities, anasarca, dizziness, palpitations  GI:  No heartburn, indigestion, abdominal pain, nausea, vomiting, diarrhea, change in bowel habits, loss of appetite  Resp:  Positive shortness of breath with exertion or at rest. No excess mucus, no productive cough, No non-productive cough, No coughing up of blood.No change in color of mucus.No wheezing.No chest wall deformity  Skin:  no rash or lesions.  GU:  no dysuria, change in color of urine, no urgency or frequency. No flank pain.  Musculoskeletal:  No joint pain or swelling. No decreased range of motion. No back pain.  Psych:  No change in mood or affect. No  depression or anxiety. No memory loss.   Past Medical History:  Diagnosis Date   Acute exacerbation of CHF (congestive heart failure) ([place]) 04/11/2019   Anemia    Arthritis    "spine, hands" (11/25/2015)   Back pain    "all my back; probably 3 times/week" (11/25/2015)   Chronic indwelling Foley catheter 03/04/2017   Diabetic foot ulcer associated with type 2 diabetes mellitus ([place])    Hypertension    Intracerebral hemorrhage ([place]) As a teenager    States she had burst blood vessel as teenager, now with resultant minor visual field and hearing deficits   Neuropathy    Stroke [facility]) ~ 1982   "my feeling on my RLE, right lower eye vision,  & hearing out of my right ear not the same since" (11/25/2015)   Type II diabetes mellitus ([place])    Past Surgical History:  Procedure Laterality Date   AMPUTATION  11/24/2010   Procedure: AMPUTATION FOOT;  Surgeon: Wylene Simmer, MD;  Location: [place];  Service: Orthopedics;  Laterality: Right;  Right  FOOT TRANS METATARSAL AMPUTATION   AMPUTATION  07/02/2011   Procedure: AMPUTATION BELOW KNEE;  Surgeon: Wylene Simmer, MD;  Location: [place];  Service: Orthopedics;  Laterality: Right;   APPLICATION OF WOUND VAC  07/02/2011   Procedure: APPLICATION OF WOUND VAC;  Surgeon: Wylene Simmer, MD;  Location: [place];  Service: Orthopedics;  Laterality: Right;   BRAIN SURGERY  1980's   Previous brain surgery in [place], [place]  2004; 2006   COLONOSCOPY N/A 09/17/2015   Procedure: COLONOSCOPY;  Surgeon: Wilford Corner, MD;  Location: [facility] ENDOSCOPY;  Service: Endoscopy;  Laterality:  N/A;   ESOPHAGOGASTRODUODENOSCOPY N/A 09/17/2015   Procedure: ESOPHAGOGASTRODUODENOSCOPY (EGD);  Surgeon: Wilford Corner, MD;  Location: [facility] ENDOSCOPY;  Service: Endoscopy;  Laterality: N/A;   I & D EXTREMITY  11/24/2010   Procedure: IRRIGATION AND DEBRIDEMENT EXTREMITY;  Surgeon: Wylene Simmer, MD;  Location: [place];  Service: Orthopedics;  Laterality: Left;   Debriedment of left plantar ulcer and trimming of toenails   I & D EXTREMITY  07/02/2011   Procedure: IRRIGATION AND DEBRIDEMENT EXTREMITY;  Surgeon: Wylene Simmer, MD;  Location: [place];  Service: Orthopedics;  Laterality: Left;  I & D of Left Foot   I & D EXTREMITY  07/06/2011   Procedure: IRRIGATION AND DEBRIDEMENT EXTREMITY;  Surgeon: Wylene Simmer, MD;  Location: [place];  Service: Orthopedics;  Laterality: Right;  IRRIGATION/DEBRIDEMENT RIGHT BELOW KNEE AMPUTATION   TUBAL LIGATION  2006   Social History:  reports that she has never smoked. She has never used smokeless tobacco. She reports current alcohol use of about 2.0 standard drinks of alcohol per week. She reports that she does not use drugs.  No Known Allergies  Family History  Problem Relation Age of Onset   Diabetes Mother    Hypertension Mother    Hyperlipidemia Mother    Diabetes Father    Cancer Father        prostate   Hyperlipidemia Father    Hypertension Father    Diabetes Brother    Peripheral Artery Disease Brother        has had toes amputated   Diabetes Son      Prior to Admission medications   Medication Sig Start Date End Date Taking? Authorizing Provider  acetaminophen (TYLENOL) 500 MG tablet Take 1,000 mg by mouth every 6 (six) hours as needed for mild pain.    [provider]  amLODipine (NORVASC) 5 MG tablet Take 1 tablet (5 mg total) by mouth daily. 01/28/20 01/27/21  Vevelyn Francois, NP  aspirin EC 81 MG tablet Take 81 mg by mouth daily as needed for moderate pain. Swallow whole.    [provider]  Blood Glucose Monitoring Suppl (PRODIGY AUTOCODE BLOOD GLUCOSE) w/Device KIT 1 kit by Does not apply route in the morning, at noon, in the evening, and at bedtime. 06/06/19 06/05/20  Vevelyn Francois, NP  gabapentin (NEURONTIN) 400 MG capsule Take 1 capsule (400 mg total) by mouth 2 (two) times daily. 01/28/20 01/27/21  Vevelyn Francois, NP  isosorbide mononitrate (IMDUR) 30 MG 24 hr tablet Take  1 tablet (30 mg total) by mouth daily. 01/28/20 01/22/21  Vevelyn Francois, NP  levofloxacin (LEVAQUIN) 500 MG tablet Take 500 mg by mouth daily.    [provider]  metFORMIN (GLUCOPHAGE) 500 MG tablet Take 1 tablet (500 mg total) by mouth 2 (two) times daily with a meal. 01/28/20 01/27/21  Vevelyn Francois, NP  metoprolol succinate (TOPROL-XL) 50 MG 24 hr tablet Take 1 tablet (50 mg total) by mouth daily. Take with or immediately following a meal. 01/28/20 01/22/21  Vevelyn Francois, NP  metroNIDAZOLE (FLAGYL) 500 MG tablet Take 500 mg by mouth 3 (three) times daily.    [provider]  ofloxacin (OCUFLOX) 0.3 % ophthalmic solution Place into the right eye. 08/16/19   [provider]  prednisoLONE acetate (PRED FORTE) 1 % ophthalmic suspension Place into the right eye. 08/16/19   [provider]  rosuvastatin (CRESTOR) 20 MG tablet Take 1 tablet (20 mg total) by mouth daily. 01/28/20 01/22/21  Vevelyn Francois, NP     Consultants:  Oncology Dr. Alen Blew   Procedures/Significant Events:    I have personally reviewed and interpreted all radiology studies and my findings are as above.   VENTILATOR SETTINGS:    Cultures 3/5 SARS coronavirus negative 3/5 influenza A/B negative    Antimicrobials:    Devices    LINES / TUBES:      Continuous Infusions:  IMMUNE GLOBULIN 10% (HUMAN) IV - For Fluid Restriction Only      Physical Exam: Vitals:   03/08/20 1434 03/08/20 1500 03/08/20 1700 03/08/20 1811  BP: (!) 146/64 (!) 149/68  (!) 153/85  Pulse: 69 74  79  Resp: '18 20  18  ' Temp:    98.6 F (37 C)  TempSrc:    Oral  SpO2: 93% 90%  93%  Weight:   95.3 kg 108.4 kg  Height:   '5\' 6"'  (1.676 m) '5\' 6"'  (1.676 m)    Wt Readings from Last 3 Encounters:  03/08/20 108.4 kg  08/02/19 (!) 95.3 kg  07/24/19 99.3 kg    General: Positive acute respiratory distress Eyes: negative scleral hemorrhage, negative anisocoria, negative icterus ENT: Negative  Runny nose, negative gingival bleeding, Neck:  Negative scars, masses, torticollis, lymphadenopathy, JVD Lungs: Clear to auscultation bilaterally without wheezes or crackles Cardiovascular: Regular rate and rhythm without murmur gallop or rub normal S1 and S2 Abdomen: OBESE, negative abdominal pain, nondistended, positive soft, bowel sounds, no rebound, no ascites, no appreciable mass Extremities: RIGHT BKA, multiple areas of bruising acute on chronic per patient. Skin: No areas of bruising all over area patient states has been occurring for approximately 1 year increased past week Psychiatric:  Negative depression, negative anxiety, negative fatigue, negative mania  Central nervous system:  Cranial nerves II through XII intact, tongue/uvula midline, all extremities muscle strength 5/5, sensation intact throughout, negative dysarthria, negative expressive aphasia, negative receptive aphasia.        Labs on Admission:  Basic Metabolic Panel: Recent Labs  Lab 03/08/20 1109  NA 136  K 6.1*  CL 105  CO2 21*  GLUCOSE 117*  BUN 47*  CREATININE 1.92*  CALCIUM 8.5*   Liver Function Tests: No results for input(s): AST, ALT, ALKPHOS, BILITOT, PROT, ALBUMIN in the last 168 hours. No results for input(s): LIPASE, AMYLASE in the last 168 hours. No results for input(s): AMMONIA in the last 168 hours. CBC: Recent Labs  Lab 03/08/20 1109 03/08/20 1751  WBC 4.1  --   HGB 9.2*  --   HCT 30.7*  --   MCV 103.4*  --   PLT 7* 6*   Cardiac Enzymes: No results for input(s): CKTOTAL, CKMB, CKMBINDEX, TROPONINI in the last 168 hours.  BNP (last 3 results) Recent Labs    04/14/19 0842 04/15/19 0418 03/08/20 1309  BNP 452.2* 415.0* 824.4*    ProBNP (last 3 results) No results for input(s): PROBNP in the last 8760 hours.  CBG: Recent Labs  Lab 03/08/20 1804  GLUCAP 76    Radiological Exams on Admission: DG Chest Portable 1 View  Result Date: 03/08/2020 CLINICAL DATA:  Fatigue and  shortness of breath. EXAM: PORTABLE CHEST 1 VIEW COMPARISON:  Single-view of the chest 07/30/2019. PA and lateral chest 05/16/2019. FINDINGS: There is cardiomegaly. Hazy bibasilar airspace disease is worse on the left. No pulmonary edema, pneumothorax or pleural effusion. No acute or focal bony abnormality. IMPRESSION: Hazy bibasilar airspace disease is worse on the left and could be due to atelectasis  or pneumonia. Cardiomegaly without edema. Electronically Signed   By: Inge Rise M.D.   On: 03/08/2020 13:02    EKG: Independently reviewed.   Assessment/Plan Active Problems:   Essential hypertension   Peripheral vascular disease, unspecified ([place])   Uncontrolled type 2 diabetes mellitus with complication ([place])   Uncontrolled type 2 diabetes mellitus with diabetic nephropathy, without long-term current use of insulin (HCC)   Benign essential HTN   Chronic ITP (idiopathic thrombocytopenia) (HCC)   Acute on chronic respiratory failure with hypoxia (HCC)  Chronic refractory ITP -IVIG per oncology -Spoke at length with Dr. Alen Blew oncology who will discuss with Dr. Lindi Adie oncology on what platelet stimulation agent patient should be discharged on Promacta vs  Nplate  Uncontrolled DM type II with complication/DM nephropathy -Hemoglobin A1c pending -Sensitive SSI  Essential HTN -Amlodipine 5 mg daily -Imdur 80 mg daily -Metoprolol (hold); HR 60s to 70s.  PVD -see HTN  Acute respiratory failure with hypoxia -Whenever patient sleeps SPO2<mid 80s.  One question concerning her hypoxia.  Patient admits that she has been informed in the past that most likely has sleep apnea and should have official sleep study, but has not bothered to obtain 1. -Titrate O2 to maintain SPO2> 88%. -Upon discharge will need referral for sleep study..   Code Status: Full (DVT Prophylaxis: SCD Family Communication:   Status is: Inpatient    Dispo: The patient is from: Home              Anticipated d/c  is to: Home              Anticipated d/c date is: Per oncology              Patient currently unstable     Data Reviewed: Care during the described time interval was provided by me .  I have reviewed this patient's available data, including medical history, events of note, physical examination, and all test results as part of my evaluation.   The patient is critically ill with multiple organ systems failure and requires high complexity decision making for assessment and support, frequent evaluation and titration of therapies, application of advanced monitoring technologies and extensive interpretation of multiple databases. Critical Care Time devoted to patient care services described in this note  Time spent: 70 minutes   Anastassia Noack, [facility]

## 2020-03-08 NOTE — Consult Note (Signed)
Reason for the request:    Thrombocytopenia  HPI: I was asked by Dr. Sherry Ruffing to evaluate [name] for thrombocytopenia.  She is a 55 year old woman with chronic Refractory ITP with prior multiple treatments including dexamethasone, rituximab x4 in May 2021 and missed few infusion appointments and the previous IVIG infusions.  Her last IVIG was given for a total of 95 g given on February 7 and February 12, 2020 after platelet count at that time of 14.  Her platelet count on February 24 was 14th and home agency draw her laboratory testing again with a platelet count of 7 and based on these findings she was referred to the emergency department.  Her CBC on March 5 showed a white cell count of 4.1 hemoglobin of 9.2 with a platelet count of 7. Potassium was 6.1 with a creatinine of 1.92.  Clinically, she feels reasonably fair without any complaints.  She denies any hematochezia, melena or hemoptysis.  She denies any epistaxis although she has reported blood tinged nasal discharge.  She has reported more bruising as of late.  She does not report any headaches, blurry vision, syncope or seizures. Does not report any fevers, chills or sweats.  Does not report any cough, wheezing or hemoptysis.  Does not report any chest pain, palpitation, orthopnea or leg edema.  Does not report any nausea, vomiting or abdominal pain.  Does not report any constipation or diarrhea.  Does not report any skeletal complaints.    Does not report frequency, urgency or hematuria.  Does not report any skin rashes or lesions. Does not report any heat or cold intolerance.  Does not report any lymphadenopathy or petechiae.  Does not report any anxiety or depression.  Remaining review of systems is negative.    Past Medical History:  Diagnosis Date  . Acute exacerbation of CHF (congestive heart failure) ([place]) 04/11/2019  . Anemia   . Arthritis    "spine, hands" (11/25/2015)  . Back pain    "all my back; probably 3 times/week"  (11/25/2015)  . Chronic indwelling Foley catheter 03/04/2017  . Diabetic foot ulcer associated with type 2 diabetes mellitus ([place])   . Hypertension   . Intracerebral hemorrhage ([place]) As a teenager    States she had burst blood vessel as teenager, now with resultant minor visual field and hearing deficits  . Neuropathy   . Stroke [facility]) ~ 1982   "my feeling on my RLE, right lower eye vision,  & hearing out of my right ear not the same since" (11/25/2015)  . Type II diabetes mellitus ([place])   :  Past Surgical History:  Procedure Laterality Date  . AMPUTATION  11/24/2010   Procedure: AMPUTATION FOOT;  Surgeon: Wylene Simmer, MD;  Location: [place];  Service: Orthopedics;  Laterality: Right;  Right  FOOT TRANS METATARSAL AMPUTATION  . AMPUTATION  07/02/2011   Procedure: AMPUTATION BELOW KNEE;  Surgeon: Wylene Simmer, MD;  Location: [place];  Service: Orthopedics;  Laterality: Right;  . APPLICATION OF WOUND VAC  07/02/2011   Procedure: APPLICATION OF WOUND VAC;  Surgeon: Wylene Simmer, MD;  Location: [place];  Service: Orthopedics;  Laterality: Right;  . BRAIN SURGERY  1980's   Previous brain surgery in [place], [place]  . CESAREAN SECTION  2004; 2006  . COLONOSCOPY N/A 09/17/2015   Procedure: COLONOSCOPY;  Surgeon: Wilford Corner, MD;  Location: [facility] ENDOSCOPY;  Service: Endoscopy;  Laterality: N/A;  . ESOPHAGOGASTRODUODENOSCOPY N/A 09/17/2015   Procedure: ESOPHAGOGASTRODUODENOSCOPY (EGD);  Surgeon: Wilford Corner,  MD;  Location: [facility] ENDOSCOPY;  Service: Endoscopy;  Laterality: N/A;  . I & D EXTREMITY  11/24/2010   Procedure: IRRIGATION AND DEBRIDEMENT EXTREMITY;  Surgeon: Wylene Simmer, MD;  Location: [place];  Service: Orthopedics;  Laterality: Left;  Debriedment of left plantar ulcer and trimming of toenails  . I & D EXTREMITY  07/02/2011   Procedure: IRRIGATION AND DEBRIDEMENT EXTREMITY;  Surgeon: Wylene Simmer, MD;  Location: [place];  Service: Orthopedics;  Laterality: Left;  I & D of Left Foot  . I & D EXTREMITY   07/06/2011   Procedure: IRRIGATION AND DEBRIDEMENT EXTREMITY;  Surgeon: Wylene Simmer, MD;  Location: [place];  Service: Orthopedics;  Laterality: Right;  IRRIGATION/DEBRIDEMENT RIGHT BELOW KNEE AMPUTATION  . TUBAL LIGATION  2006  :   Current Facility-Administered Medications:  .  Immune Globulin 10% (PRIVIGEN) IV infusion 1 g/kg, 1 g/kg, Intravenous, Q24H, Shadad, Mathis Dad, MD  Current Outpatient Medications:  .  acetaminophen (TYLENOL) 500 MG tablet, Take 1,000 mg by mouth every 6 (six) hours as needed for mild pain., Disp: , Rfl:  .  amLODipine (NORVASC) 5 MG tablet, Take 1 tablet (5 mg total) by mouth daily., Disp: 90 tablet, Rfl: 3 .  aspirin EC 81 MG tablet, Take 81 mg by mouth daily as needed for moderate pain. Swallow whole., Disp: , Rfl:  .  Blood Glucose Monitoring Suppl (PRODIGY AUTOCODE BLOOD GLUCOSE) w/Device KIT, 1 kit by Does not apply route in the morning, at noon, in the evening, and at bedtime., Disp: 1 kit, Rfl: 0 .  gabapentin (NEURONTIN) 400 MG capsule, Take 1 capsule (400 mg total) by mouth 2 (two) times daily., Disp: 180 capsule, Rfl: 3 .  isosorbide mononitrate (IMDUR) 30 MG 24 hr tablet, Take 1 tablet (30 mg total) by mouth daily., Disp: 90 tablet, Rfl: 3 .  levofloxacin (LEVAQUIN) 500 MG tablet, Take 500 mg by mouth daily., Disp: , Rfl:  .  metFORMIN (GLUCOPHAGE) 500 MG tablet, Take 1 tablet (500 mg total) by mouth 2 (two) times daily with a meal., Disp: 180 tablet, Rfl: 3 .  metoprolol succinate (TOPROL-XL) 50 MG 24 hr tablet, Take 1 tablet (50 mg total) by mouth daily. Take with or immediately following a meal., Disp: 90 tablet, Rfl: 3 .  metroNIDAZOLE (FLAGYL) 500 MG tablet, Take 500 mg by mouth 3 (three) times daily., Disp: , Rfl:  .  ofloxacin (OCUFLOX) 0.3 % ophthalmic solution, Place into the right eye., Disp: , Rfl:  .  prednisoLONE acetate (PRED FORTE) 1 % ophthalmic suspension, Place into the right eye., Disp: , Rfl:  .  rosuvastatin (CRESTOR) 20 MG tablet, Take 1  tablet (20 mg total) by mouth daily., Disp: 90 tablet, Rfl: 3:  No Known Allergies:  Family History  Problem Relation Age of Onset  . Diabetes Mother   . Hypertension Mother   . Hyperlipidemia Mother   . Diabetes Father   . Cancer Father        prostate  . Hyperlipidemia Father   . Hypertension Father   . Diabetes Brother   . Peripheral Artery Disease Brother        has had toes amputated  . Diabetes Son   :  Social History   Socioeconomic History  . Marital status: Widowed    Spouse name: Not on file  . Number of children: Not on file  . Years of education: Not on file  . Highest education level: Not on file  Occupational History  .  Not on file  Tobacco Use  . Smoking status: Never Smoker  . Smokeless tobacco: Never Used  Vaping Use  . Vaping Use: Never used  Substance and Sexual Activity  . Alcohol use: Yes    Alcohol/week: 2.0 standard drinks    Types: 2 Glasses of wine per week  . Drug use: No  . Sexual activity: Not Currently    Birth control/protection: Post-menopausal  Other Topics Concern  . Not on file  Social History Narrative   Widowed in 2012   Lives in a home she owns   No wheelchair ramp, though garage entry is flush to both garage and entry floor.   Unable to use full bathroom upstairs.   Has prosthesis for right LE, but never learned how to use.   Cannot see well due to cataracts.   Did not receive disability in 03/9765 with application.   Possibly due to husband's SS she receives   Social Determinants of Radio broadcast assistant Strain: Not on file  Food Insecurity: Not on file  Transportation Needs: Not on file  Physical Activity: Not on file  Stress: Not on file  Social Connections: Not on file  Intimate Partner Violence: Not on file  :  Pertinent items are noted in HPI.  Exam: Blood pressure 135/64, pulse 63, temperature 98.4 F (36.9 C), temperature source Oral, resp. rate 17, SpO2 94 %.  ECOG 1 General appearance: alert  and cooperative appeared without distress. Head: atraumatic without any abnormalities. Eyes: conjunctivae/corneas clear. PERRL.  Sclera anicteric. Throat: lips, mucosa, and tongue normal; without oral thrush or ulcers. Resp: clear to auscultation bilaterally without rhonchi, wheezes or dullness to percussion. Cardio: regular rate and rhythm, S1, S2 normal, no murmur, click, rub or gallop GI: soft, non-tender; bowel sounds normal; no masses,  no organomegaly Skin: Dark pigmentation and bruising noted. Lymph nodes: Cervical, supraclavicular, and axillary nodes normal. Neurologic: Grossly normal without any motor, sensory or deep tendon reflexes. Musculoskeletal: No joint deformity or effusion.  Recent Labs    03/08/20 1109  WBC 4.1  HGB 9.2*  HCT 30.7*  PLT 7*   Recent Labs    03/08/20 1109  NA 136  K 6.1*  CL 105  CO2 21*  GLUCOSE 117*  BUN 47*  CREATININE 1.92*  CALCIUM 8.5*      DG Chest Portable 1 View  Result Date: 03/08/2020 CLINICAL DATA:  Fatigue and shortness of breath. EXAM: PORTABLE CHEST 1 VIEW COMPARISON:  Single-view of the chest 07/30/2019. PA and lateral chest 05/16/2019. FINDINGS: There is cardiomegaly. Hazy bibasilar airspace disease is worse on the left. No pulmonary edema, pneumothorax or pleural effusion. No acute or focal bony abnormality. IMPRESSION: Hazy bibasilar airspace disease is worse on the left and could be due to atelectasis or pneumonia. Cardiomegaly without edema. Electronically Signed   By: Inge Rise M.D.   On: 03/08/2020 13:02    Assessment and Plan:    55 year old woman with:  1.  Relapsed refractory ITP with the multiple treatments noted in the past occluding rituximab, steroids and IVIG.  Steroids have been avoided because of diabetes.  Laboratory data from today reviewed and discussed with her and showed cell count of 7000 although no active bleeding.  Treatment options moving forward were discussed.  I feel that she would  benefit from receiving another IVIG course given her low platelets and increased risk of bleeding at this time.  She might require additional treatment upon discharge including Promacta or Nplate.  I do not see any evidence of other hematological disorder at this time.  Risks and benefits of IVIG infusion discussed today.  This includes volume overload could be a concern for congestive heart failure as well as infusion related complications.  The plan is to proceed with 1 g/kg for 2 consecutive days starting on March 08, 2020.  She is agreeable to proceed.   2.  Acute renal failure with hyperkalemia: Management as per the primary team.  Do not think this is related to her ITP or other hematological disorder.   We will continue to follow during her hospitalization.  I will alert Dr. Lindi Adie to her admission.   80  minutes were dedicated to this visit.  50% of time was spent face-to-face.  The time was spent on reviewing laboratory data, discussing treatment options, discussing differential diagnosis and answering questions regarding future plan.     A copy of this consult has been forwarded to the requesting physician.

## 2020-03-08 NOTE — ED Triage Notes (Addendum)
Pt states her PCP office called her and told her to come to ED for high potassium and low platelets.  Denies any complaints.

## 2020-03-08 NOTE — ED Notes (Signed)
This Rn attempted IV access twice without success

## 2020-03-08 NOTE — ED Notes (Signed)
Phlebotomy unable to obtain labs 

## 2020-03-08 NOTE — ED Notes (Signed)
Pt placed on 2L [place] due to oxygen saturation dropping to 85% while sleeping

## 2020-03-08 NOTE — ED Provider Notes (Signed)
[facility] EMERGENCY DEPARTMENT Provider Note   CSN: [id number] Arrival date & time: 03/08/20  1041     History Chief Complaint  Patient presents with  . high potassium/ low platelets    [name] is a 55 y.o. female.  The history is provided by the patient and medical records. No language interpreter was used.  Illness Location:  Fatigue Quality:  All over Severity:  Moderate Onset quality:  Gradual Timing:  Constant Progression:  Unchanged Chronicity:  Recurrent Associated symptoms: fatigue and shortness of breath   Associated symptoms: no abdominal pain, no chest pain, no congestion, no cough, no diarrhea, no fever, no headaches, no nausea, no vomiting and no wheezing        Past Medical History:  Diagnosis Date  . Acute exacerbation of CHF (congestive heart failure) ([place]) 04/11/2019  . Anemia   . Arthritis    "spine, hands" (11/25/2015)  . Back pain    "all my back; probably 3 times/week" (11/25/2015)  . Chronic indwelling Foley catheter 03/04/2017  . Diabetic foot ulcer associated with type 2 diabetes mellitus ([place])   . Hypertension   . Intracerebral hemorrhage ([place]) As a teenager    States she had burst blood vessel as teenager, now with resultant minor visual field and hearing deficits  . Neuropathy   . Stroke [facility]) ~ 1982   "my feeling on my RLE, right lower eye vision,  & hearing out of my right ear not the same since" (11/25/2015)  . Type II diabetes mellitus [facility])     Patient Active Problem List   Diagnosis Date Noted  . Cellulitis of foot 08/02/2019  . Nausea and vomiting 06/26/2019  . Hematuria 06/26/2019  . Hypertensive urgency 06/26/2019  . Acute diastolic CHF (congestive heart failure) ([place])   . CHF (congestive heart failure) ([place]) 04/11/2019  . Acute exacerbation of CHF (congestive heart failure) ([place]) 04/11/2019  . Leukopenia 04/11/2019  . History of ITP 04/11/2019  . Idiopathic thrombocytopenic purpura (ITP) (HCC)  11/16/2018  . Onychomycosis of toenail 10/16/2018  . Cataract of both eyes 10/16/2018  . Tinea pedis of left foot 10/16/2018  . Decreased vision in both eyes 10/16/2018  . CKD (chronic kidney disease) stage 3, GFR 30-59 ml/min   . Cellulitis of left lower extremity 01/25/2018  . Hyperkalemia 01/25/2018  . Poor compliance 03/08/2017  . Hypoxia 03/07/2017  . Thyroid nodule 03/07/2017  . Chronic indwelling Foley catheter 03/04/2017  . Type II diabetes mellitus ([place])   . Femur fracture ([place]) 03/02/2017  . Orthostatic dizziness   . Urinary tract infection without hematuria   . Enterococcus faecalis infection   . Near syncope 11/25/2015  . Dehydration 11/25/2015  . Orthostatic hypotension 11/25/2015  . UTI (urinary tract infection) 11/25/2015  . Acute worsening of stage 3 chronic kidney disease ([place]) 09/24/2015  . Diabetes mellitus due to underlying condition with chronic kidney disease, without long-term current use of insulin ([place])   . Fever   . Status post below knee amputation of right lower extremity ([place])   . Diabetic peripheral neuropathy ([place])   . Neuropathic pain   . Benign essential HTN   . Folliculitis   . Slow transit constipation   . Anemia of chronic disease   . Bilateral hydronephrosis   . Urinary retention   . CKD (chronic kidney disease)   . Uncontrolled type 2 diabetes mellitus with diabetic nephropathy, without long-term current use of insulin ([place])   . Vasculopathy   .  Debility 09/04/2015  . DM type 2 with diabetic peripheral neuropathy ([place])   . S/P BKA (below knee amputation) unilateral ([place])   . Medical non-compliance   . Benign skin lesion of multiple sites   . Tachypnea   . Acute blood loss anemia   . Neurogenic bladder   . Occult blood positive stool   . Rectovaginal fistula   . Controlled diabetes mellitus type 2 with complications ([place])   . Acute on chronic renal failure ([place])   . Hydronephrosis determined by ultrasound   . Papular rash   .  Uncontrolled type 2 diabetes mellitus with complication ([place])   . Paresthesia   . Thrombocytopenia ([place]) 08/23/2015  . Pressure ulcer 08/23/2015  . Severe anemia 08/23/2015  . Lower urinary tract infectious disease 08/23/2015  . Absolute anemia 08/23/2015  . Peripheral vascular disease, unspecified ([place]) 04/27/2012  . Unilateral complete BKA ([place]) 07/09/2011  . Gas gangrene ([place]) 07/02/2011  . Sepsis(995.91) 07/02/2011  . Acute kidney injury superimposed on chronic kidney disease ([place]) 07/02/2011  . Diabetes mellitus ([place]) 11/22/2010  . Diabetic foot ulcer associated with type 2 diabetes mellitus ([place])   . Essential hypertension   . Arthritis   . Neuropathy ([place])   . Intracerebral hemorrhage [facility])     Past Surgical History:  Procedure Laterality Date  . AMPUTATION  11/24/2010   Procedure: AMPUTATION FOOT;  Surgeon: Wylene Simmer, MD;  Location: [facility];  Service: Orthopedics;  Laterality: Right;  Right  FOOT TRANS METATARSAL AMPUTATION  . AMPUTATION  07/02/2011   Procedure: AMPUTATION BELOW KNEE;  Surgeon: Wylene Simmer, MD;  Location: [place];  Service: Orthopedics;  Laterality: Right;  . APPLICATION OF WOUND VAC  07/02/2011   Procedure: APPLICATION OF WOUND VAC;  Surgeon: Wylene Simmer, MD;  Location: [facility];  Service: Orthopedics;  Laterality: Right;  . BRAIN SURGERY  1980's   Previous brain surgery in [place]  . CESAREAN SECTION  2004; 2006  . COLONOSCOPY N/A 09/17/2015   Procedure: COLONOSCOPY;  Surgeon: Wilford Corner, MD;  Location[facility] ENDOSCOPY;  Service: Endoscopy;  Laterality: N/A;  . ESOPHAGOGASTRODUODENOSCOPY N/A 09/17/2015   Procedure: ESOPHAGOGASTRODUODENOSCOPY (EGD);  Surgeon: Wilford Corner, MD;  Location: [facility] ENDOSCOPY;  Service: Endoscopy;  Laterality: N/A;  . I & D EXTREMITY  11/24/2010   Procedure: IRRIGATION AND DEBRIDEMENT EXTREMITY;  Surgeon: Wylene Simmer, MD;  Location: [facility];  Service: Orthopedics;  Laterality: Left;  Debriedment of left plantar ulcer and trimming  of toenails  . I & D EXTREMITY  07/02/2011   Procedure: IRRIGATION AND DEBRIDEMENT EXTREMITY;  Surgeon: Wylene Simmer, MD;  Location: [facility];  Service: Orthopedics;  Laterality: Left;  I & D of Left Foot  . I & D EXTREMITY  07/06/2011   Procedure: IRRIGATION AND DEBRIDEMENT EXTREMITY;  Surgeon: Wylene Simmer, MD;  Location: [place] [place];  Service: Orthopedics;  Laterality: Right;  IRRIGATION/DEBRIDEMENT RIGHT BELOW KNEE AMPUTATION  . TUBAL LIGATION  2006     OB History   No obstetric history on file.     Family History  Problem Relation Age of Onset  . Diabetes Mother   . Hypertension Mother   . Hyperlipidemia Mother   . Diabetes Father   . Cancer Father        prostate  . Hyperlipidemia Father   . Hypertension Father   . Diabetes Brother   . Peripheral Artery Disease Brother        has had toes amputated  . Diabetes Son  Social History   Tobacco Use  . Smoking status: Never Smoker  . Smokeless tobacco: Never Used  Vaping Use  . Vaping Use: Never used  Substance Use Topics  . Alcohol use: Yes    Alcohol/week: 2.0 standard drinks    Types: 2 Glasses of wine per week  . Drug use: No    Home Medications Prior to Admission medications   Medication Sig Start Date End Date Taking? Authorizing Provider  acetaminophen (TYLENOL) 500 MG tablet Take 1,000 mg by mouth every 6 (six) hours as needed for mild pain.    [provider]  amLODipine (NORVASC) 5 MG tablet Take 1 tablet (5 mg total) by mouth daily. 01/28/20 01/27/21  Vevelyn Francois, NP  aspirin EC 81 MG tablet Take 81 mg by mouth daily as needed for moderate pain. Swallow whole.    [provider]  Blood Glucose Monitoring Suppl (PRODIGY AUTOCODE BLOOD GLUCOSE) w/Device KIT 1 kit by Does not apply route in the morning, at noon, in the evening, and at bedtime. 06/06/19 06/05/20  Vevelyn Francois, NP  gabapentin (NEURONTIN) 400 MG capsule Take 1 capsule (400 mg total) by mouth 2 (two) times daily. 01/28/20 01/27/21  Vevelyn Francois, NP  isosorbide mononitrate (IMDUR) 30 MG 24 hr tablet Take 1 tablet (30 mg total) by mouth daily. 01/28/20 01/22/21  Vevelyn Francois, NP  levofloxacin (LEVAQUIN) 500 MG tablet Take 500 mg by mouth daily.    [provider]  metFORMIN (GLUCOPHAGE) 500 MG tablet Take 1 tablet (500 mg total) by mouth 2 (two) times daily with a meal. 01/28/20 01/27/21  Vevelyn Francois, NP  metoprolol succinate (TOPROL-XL) 50 MG 24 hr tablet Take 1 tablet (50 mg total) by mouth daily. Take with or immediately following a meal. 01/28/20 01/22/21  Vevelyn Francois, NP  metroNIDAZOLE (FLAGYL) 500 MG tablet Take 500 mg by mouth 3 (three) times daily.    [provider]  ofloxacin (OCUFLOX) 0.3 % ophthalmic solution Place into the right eye. 08/16/19   [provider]  prednisoLONE acetate (PRED FORTE) 1 % ophthalmic suspension Place into the right eye. 08/16/19   [provider]  rosuvastatin (CRESTOR) 20 MG tablet Take 1 tablet (20 mg total) by mouth daily. 01/28/20 01/22/21  Vevelyn Francois, NP    Allergies    Patient has no known allergies.  Review of Systems   Review of Systems  Constitutional: Positive for fatigue. Negative for chills, diaphoresis and fever.  HENT: Negative for congestion.   Eyes: Negative for visual disturbance.  Respiratory: Positive for shortness of breath. Negative for cough, chest tightness and wheezing.   Cardiovascular: Positive for leg swelling. Negative for chest pain and palpitations.  Gastrointestinal: Negative for abdominal pain, constipation, diarrhea, nausea and vomiting.  Genitourinary: Negative for dysuria and frequency.  Musculoskeletal: Negative for back pain, neck pain and neck stiffness.  Skin: Positive for color change (bruises easily).  Neurological: Negative for dizziness, weakness, light-headedness and headaches.  Psychiatric/Behavioral: Negative for agitation and confusion.  All other systems reviewed and are negative.   Physical  Exam Updated Vital Signs BP 135/64 (BP Location: Left Arm)   Pulse 63   Temp 98.4 F (36.9 C) (Oral)   Resp 17   LMP  (LMP Unknown)   SpO2 94%   Physical Exam Vitals and nursing note reviewed.  Constitutional:      General: She is not in acute distress.    Appearance: She is well-developed and  well-nourished. She is not ill-appearing, toxic-appearing or diaphoretic.  HENT:     Head: Normocephalic and atraumatic.     Nose: No congestion or rhinorrhea.     Mouth/Throat:     Mouth: Mucous membranes are moist.     Pharynx: No oropharyngeal exudate or posterior oropharyngeal erythema.  Eyes:     Extraocular Movements: Extraocular movements intact.     Conjunctiva/sclera: Conjunctivae normal.     Pupils: Pupils are equal, round, and reactive to light.  Cardiovascular:     Rate and Rhythm: Normal rate and regular rhythm.     Heart sounds: No murmur heard.   Pulmonary:     Effort: Pulmonary effort is normal. No respiratory distress.     Breath sounds: Normal breath sounds. No wheezing, rhonchi or rales.  Chest:     Chest wall: No tenderness.  Abdominal:     Palpations: Abdomen is soft.     Tenderness: There is no abdominal tenderness. There is no right CVA tenderness, guarding or rebound.  Musculoskeletal:        General: No tenderness or edema.     Cervical back: Neck supple. No tenderness.  Skin:    General: Skin is warm and dry.     Capillary Refill: Capillary refill takes less than 2 seconds.     Findings: Bruising present.  Neurological:     General: No focal deficit present.     Mental Status: She is alert.     Sensory: No sensory deficit.     Motor: No weakness.  Psychiatric:        Mood and Affect: Mood and affect and mood normal.     ED Results / Procedures / Treatments   Labs (all labs ordered are listed, but only abnormal results are displayed) Labs Reviewed  BASIC METABOLIC PANEL - Abnormal; Notable for the following components:      Result Value    Potassium 6.1 (*)    CO2 21 (*)    Glucose, Bld 117 (*)    BUN 47 (*)    Creatinine, Ser 1.92 (*)    Calcium 8.5 (*)    GFR, Estimated 31 (*)    All other components within normal limits  CBC - Abnormal; Notable for the following components:   RBC 2.97 (*)    Hemoglobin 9.2 (*)    HCT 30.7 (*)    MCV 103.4 (*)    RDW 17.6 (*)    Platelets 7 (*)    nRBC 0.5 (*)    All other components within normal limits  BRAIN NATRIURETIC PEPTIDE - Abnormal; Notable for the following components:   B Natriuretic Peptide 824.4 (*)    All other components within normal limits  URINALYSIS, ROUTINE W REFLEX MICROSCOPIC  HEMOGLOBIN A1C  LIPID PANEL  HIV ANTIBODY (ROUTINE TESTING W REFLEX)  PLATELET COUNT  TYPE AND SCREEN  TROPONIN I (HIGH SENSITIVITY)  TROPONIN I (HIGH SENSITIVITY)    EKG EKG Interpretation  Date/Time:  Saturday March 08 2020 10:46:53 EST Ventricular Rate:  68 PR Interval:  176 QRS Duration: 68 QT Interval:  432 QTC Calculation: 459 R Axis:   48 Text Interpretation: Normal sinus rhythm Normal ECG When compared to prior, similar apperance to prior. No STEMI Confirmed by [name] [phone]) on 03/08/2020 12:22:00 PM   Radiology DG Chest Portable 1 View  Result Date: 03/08/2020 CLINICAL DATA:  Fatigue and shortness of breath. EXAM: PORTABLE CHEST 1 VIEW COMPARISON:  Single-view of the chest 07/30/2019. PA  and lateral chest 05/16/2019. FINDINGS: There is cardiomegaly. Hazy bibasilar airspace disease is worse on the left. No pulmonary edema, pneumothorax or pleural effusion. No acute or focal bony abnormality. IMPRESSION: Hazy bibasilar airspace disease is worse on the left and could be due to atelectasis or pneumonia. Cardiomegaly without edema. Electronically Signed   By: Inge Rise M.D.   On: 03/08/2020 13:02    Procedures Procedures   CRITICAL CARE Performed by: Gwenyth Allegra Burrell Hodapp Total critical care time: 35  minutes Critical care time was exclusive of  separately billable procedures and treating other patients. Critical care was necessary to treat or prevent imminent or life-threatening deterioration. Critical care was time spent personally by me on the following activities: development of treatment plan with patient and/or surrogate as well as nursing, discussions with consultants, evaluation of patient's response to treatment, examination of patient, obtaining history from patient or surrogate, ordering and performing treatments and interventions, ordering and review of laboratory studies, ordering and review of radiographic studies, pulse oximetry and re-evaluation of patient's condition.   Medications Ordered in ED Medications  Immune Globulin 10% (PRIVIGEN) IV infusion 1 g/kg (has no administration in time range)    ED Course  I have reviewed the triage vital signs and the nursing notes.  Pertinent labs & imaging results that were available during my care of the patient were reviewed by me and considered in my medical decision making (see chart for details).    MDM Rules/Calculators/A&P                          [name] is a 55 y.o. female with a past medical history significant for diabetes, hypertension, prior and cerebral hemorrhage, right BKA, CKD, CHF, prior stroke, chronic anemia, and ITP who was sent by her oncology team for evaluation management and admission for lab abnormalities including hyperkalemia and thrombocytopenia recurrence.  Patient says that she has been having some fatigue, increase in bruising, slightly worsened peripheral edema in the left leg and some mild shortness of breath for last few days.  She reports she saw her oncologist 2 days ago and did some blood work showing thrombocytopenia with platelet of 5.  Today it was repeated and was found to be 7.  Patient also has hyperkalemia and elevated creatinine compared to prior.  Patient is denying any chest pain at this time.  Denies any new nausea or  vomiting and denies any abdominal pain.  She denies any constipation or diarrhea and denies any acute urine changes.  She reports her left leg is slightly more swollen than baseline and she has a right BKA so unable to compare.  Vital signs are reassuring on arrival and she is not hypoxic or tachycardic.  She is not hypotensive or febrile.  On exam, lungs are clear and chest is nontender.  Abdomen is nontender.  Patient moving all extremities.  She has intact pulse in her left lower extremity and right leg is the BKA.  Abdomen is nontender.  No focal neurologic deficits on muscle exam.  Clinically I am concerned about the patient's lab abnormalities that sent her here.  On the repeat lab work, patient does have AKI and her potassium is elevated.  Her platelets are also low at 7.  I called the oncology team who recommends she be admitted and they will follow.  They report she will likely need IVIG treatments again and they agreed with some fluids for her kidneys  and for the hyperkalemia.  She did not appear to have EKG changes acutely.  No STEMI seen.  Medicine team was called who will admit patient and help arrange the IVIG and further inpatient management.  Patient will be mated.  Final Clinical Impression(s) / ED Diagnoses Final diagnoses:  Thrombocytopenia (HCC)  Hyperkalemia  AKI (acute kidney injury) ([place])     Clinical Impression: 1. Chronic ITP (idiopathic thrombocytopenia) (HCC)   2. Thrombocytopenia ([place])   3. Hyperkalemia   4. AKI (acute kidney injury) ([place])     Disposition: Admit  This note was prepared with assistance of Dragon voice recognition software. Occasional wrong-word or sound-a-like substitutions may have occurred due to the inherent limitations of voice recognition software.     Guillermo Difrancesco, Gwenyth Allegra, MD 03/08/20 [phone]

## 2020-03-09 ENCOUNTER — Observation Stay (HOSPITAL_COMMUNITY): Payer: Medicare Other

## 2020-03-09 DIAGNOSIS — R0602 Shortness of breath: Secondary | ICD-10-CM | POA: Diagnosis not present

## 2020-03-09 DIAGNOSIS — Z9851 Tubal ligation status: Secondary | ICD-10-CM | POA: Diagnosis not present

## 2020-03-09 DIAGNOSIS — I739 Peripheral vascular disease, unspecified: Secondary | ICD-10-CM

## 2020-03-09 DIAGNOSIS — D693 Immune thrombocytopenic purpura: Principal | ICD-10-CM

## 2020-03-09 DIAGNOSIS — Z23 Encounter for immunization: Secondary | ICD-10-CM | POA: Diagnosis not present

## 2020-03-09 DIAGNOSIS — J9621 Acute and chronic respiratory failure with hypoxia: Secondary | ICD-10-CM

## 2020-03-09 DIAGNOSIS — E876 Hypokalemia: Secondary | ICD-10-CM | POA: Diagnosis not present

## 2020-03-09 DIAGNOSIS — E1169 Type 2 diabetes mellitus with other specified complication: Secondary | ICD-10-CM

## 2020-03-09 DIAGNOSIS — Z79899 Other long term (current) drug therapy: Secondary | ICD-10-CM | POA: Diagnosis not present

## 2020-03-09 DIAGNOSIS — Z8249 Family history of ischemic heart disease and other diseases of the circulatory system: Secondary | ICD-10-CM | POA: Diagnosis not present

## 2020-03-09 DIAGNOSIS — Z89511 Acquired absence of right leg below knee: Secondary | ICD-10-CM | POA: Diagnosis not present

## 2020-03-09 DIAGNOSIS — E1151 Type 2 diabetes mellitus with diabetic peripheral angiopathy without gangrene: Secondary | ICD-10-CM | POA: Diagnosis present

## 2020-03-09 DIAGNOSIS — I10 Essential (primary) hypertension: Secondary | ICD-10-CM

## 2020-03-09 DIAGNOSIS — I5031 Acute diastolic (congestive) heart failure: Secondary | ICD-10-CM | POA: Diagnosis present

## 2020-03-09 DIAGNOSIS — Z833 Family history of diabetes mellitus: Secondary | ICD-10-CM | POA: Diagnosis not present

## 2020-03-09 DIAGNOSIS — E1165 Type 2 diabetes mellitus with hyperglycemia: Secondary | ICD-10-CM | POA: Diagnosis present

## 2020-03-09 DIAGNOSIS — M1990 Unspecified osteoarthritis, unspecified site: Secondary | ICD-10-CM | POA: Diagnosis present

## 2020-03-09 DIAGNOSIS — Z7984 Long term (current) use of oral hypoglycemic drugs: Secondary | ICD-10-CM | POA: Diagnosis not present

## 2020-03-09 DIAGNOSIS — I517 Cardiomegaly: Secondary | ICD-10-CM | POA: Diagnosis not present

## 2020-03-09 DIAGNOSIS — Z8673 Personal history of transient ischemic attack (TIA), and cerebral infarction without residual deficits: Secondary | ICD-10-CM | POA: Diagnosis not present

## 2020-03-09 DIAGNOSIS — I130 Hypertensive heart and chronic kidney disease with heart failure and stage 1 through stage 4 chronic kidney disease, or unspecified chronic kidney disease: Secondary | ICD-10-CM | POA: Diagnosis present

## 2020-03-09 DIAGNOSIS — N183 Chronic kidney disease, stage 3 unspecified: Secondary | ICD-10-CM | POA: Diagnosis present

## 2020-03-09 DIAGNOSIS — Z7982 Long term (current) use of aspirin: Secondary | ICD-10-CM | POA: Diagnosis not present

## 2020-03-09 DIAGNOSIS — E875 Hyperkalemia: Secondary | ICD-10-CM | POA: Diagnosis present

## 2020-03-09 DIAGNOSIS — E1122 Type 2 diabetes mellitus with diabetic chronic kidney disease: Secondary | ICD-10-CM | POA: Diagnosis present

## 2020-03-09 DIAGNOSIS — N179 Acute kidney failure, unspecified: Secondary | ICD-10-CM | POA: Diagnosis present

## 2020-03-09 DIAGNOSIS — Z20822 Contact with and (suspected) exposure to covid-19: Secondary | ICD-10-CM | POA: Diagnosis present

## 2020-03-09 LAB — HEMOGLOBIN A1C
Hgb A1c MFr Bld: 5.1 % (ref 4.8–5.6)
Mean Plasma Glucose: 99.67 mg/dL

## 2020-03-09 LAB — COMPREHENSIVE METABOLIC PANEL
ALT: 12 U/L (ref 0–44)
AST: 17 U/L (ref 15–41)
Albumin: 3 g/dL — ABNORMAL LOW (ref 3.5–5.0)
Alkaline Phosphatase: 62 U/L (ref 38–126)
Anion gap: 9 (ref 5–15)
BUN: 45 mg/dL — ABNORMAL HIGH (ref 6–20)
CO2: 19 mmol/L — ABNORMAL LOW (ref 22–32)
Calcium: 8.3 mg/dL — ABNORMAL LOW (ref 8.9–10.3)
Chloride: 106 mmol/L (ref 98–111)
Creatinine, Ser: 1.44 mg/dL — ABNORMAL HIGH (ref 0.44–1.00)
GFR, Estimated: 43 mL/min — ABNORMAL LOW (ref >60)
Glucose, Bld: 86 mg/dL (ref 70–99)
Potassium: 5 mmol/L (ref 3.5–5.1)
Sodium: 134 mmol/L — ABNORMAL LOW (ref 135–145)
Total Bilirubin: 0.6 mg/dL (ref 0.3–1.2)
Total Protein: 7.2 g/dL (ref 6.5–8.1)

## 2020-03-09 LAB — URINALYSIS, ROUTINE W REFLEX MICROSCOPIC
Bacteria, UA: NONE SEEN
Bilirubin Urine: NEGATIVE
Glucose, UA: NEGATIVE mg/dL
Ketones, ur: NEGATIVE mg/dL
Nitrite: NEGATIVE
Protein, ur: 30 mg/dL — AB
Specific Gravity, Urine: 1.01 (ref 1.005–1.030)
pH: 7 (ref 5.0–8.0)

## 2020-03-09 LAB — CBC
HCT: 27.7 % — ABNORMAL LOW (ref 36.0–46.0)
Hemoglobin: 8.2 g/dL — ABNORMAL LOW (ref 12.0–15.0)
MCH: 30.4 pg (ref 26.0–34.0)
MCHC: 29.6 g/dL — ABNORMAL LOW (ref 30.0–36.0)
MCV: 102.6 fL — ABNORMAL HIGH (ref 80.0–100.0)
Platelets: 10 10*3/uL — CL (ref 150–400)
RBC: 2.7 MIL/uL — ABNORMAL LOW (ref 3.87–5.11)
RDW: 17.1 % — ABNORMAL HIGH (ref 11.5–15.5)
WBC: 2.6 10*3/uL — ABNORMAL LOW (ref 4.0–10.5)
nRBC: 0 % (ref 0.0–0.2)

## 2020-03-09 LAB — GLUCOSE, CAPILLARY
Glucose-Capillary: 88 mg/dL (ref 70–99)
Glucose-Capillary: 111 mg/dL — ABNORMAL HIGH (ref 70–99)
Glucose-Capillary: 131 mg/dL — ABNORMAL HIGH (ref 70–99)
Glucose-Capillary: 145 mg/dL — ABNORMAL HIGH (ref 70–99)
Glucose-Capillary: 115 mg/dL — ABNORMAL HIGH (ref 70–99)

## 2020-03-09 LAB — MAGNESIUM: Magnesium: 2.3 mg/dL (ref 1.7–2.4)

## 2020-03-09 LAB — PHOSPHORUS: Phosphorus: 5.2 mg/dL — ABNORMAL HIGH (ref 2.5–4.6)

## 2020-03-09 NOTE — Progress Notes (Signed)
IP PROGRESS NOTE  Subjective:   [name] reports no complaints at this time. She feels well overall and has tolerated IVIG infusion without any complaints. She denies any nausea, shortness of breath or edema. She denies any bleeding complications including hemoptysis, hematemesis, hematochezia or epistaxis.  Objective:  Vital signs in last 24 hours: Temp:  [97.6 F (36.4 C)-99.2 F (37.3 C)] 97.6 F (36.4 C) (03/06 0836) Pulse Rate:  [63-80] 77 (03/06 0836) Resp:  [16-20] 18 (03/06 0836) BP: (129-153)/(59-95) 147/95 (03/06 0836) SpO2:  [88 %-97 %] 92 % (03/06 0836) Weight:  [210 lb (95.3 kg)-240 lb 4.8 oz (109 kg)] 240 lb 4.8 oz (109 kg) (03/06 0146) Weight change:  Last BM Date: 03/07/20  Intake/Output from previous day: 03/05 0701 - 03/06 0700 In: 360 [P.O.:360] Out: -  General: Alert, awake without distress. Head: Normocephalic atraumatic. Mouth: mucous membranes moist, pharynx normal without lesions Eyes: No scleral icterus.  Pupils are equal and round reactive to light. Resp: clear to auscultation bilaterally without rhonchi or wheezes or dullness to percussion. Cardio: regular rate and rhythm, S1, S2 normal, no murmur, click, rub or gallop GI: soft, non-tender; bowel sounds normal; no masses,  no organomegaly Musculoskeletal: No joint deformity or effusion. Neurological: No motor, sensory deficits.  Intact deep tendon reflexes. Skin: No rashes or lesions.   Lab Results: Recent Labs    03/08/20 1109 03/08/20 1751 03/09/20 0505  WBC 4.1  --  2.6*  HGB 9.2*  --  8.2*  HCT 30.7*  --  27.7*  PLT 7* 6* 10*    BMET Recent Labs    03/08/20 1109 03/09/20 0505  NA 136 134*  K 6.1* 5.0  CL 105 106  CO2 21* 19*  GLUCOSE 117* 86  BUN 47* 45*  CREATININE 1.92* 1.44*  CALCIUM 8.5* 8.3*    Studies/Results: Portable chest 1 View  Result Date: 03/09/2020 CLINICAL DATA:  Shortness of breath.  Hazy bibasilar opacities. EXAM: PORTABLE CHEST 1 VIEW COMPARISON:   March 08, 2020 FINDINGS: Cardiomegaly. No pneumothorax. No nodules or masses. No focal infiltrate seen on today's study. IMPRESSION: Cardiomegaly. No focal infiltrate identified. No acute abnormality noted. Electronically Signed   By: Gerome Sam III M.D   On: 03/09/2020 08:43   DG Chest Portable 1 View  Result Date: 03/08/2020 CLINICAL DATA:  Fatigue and shortness of breath. EXAM: PORTABLE CHEST 1 VIEW COMPARISON:  Single-view of the chest 07/30/2019. PA and lateral chest 05/16/2019. FINDINGS: There is cardiomegaly. Hazy bibasilar airspace disease is worse on the left. No pulmonary edema, pneumothorax or pleural effusion. No acute or focal bony abnormality. IMPRESSION: Hazy bibasilar airspace disease is worse on the left and could be due to atelectasis or pneumonia. Cardiomegaly without edema. Electronically Signed   By: Drusilla Kanner M.D.   On: 03/08/2020 13:02    Medications: I have reviewed the patient's current medications.  Assessment/Plan:  55 year old woman with:  1.   ITP presented with relapsed disease despite previous treatment with steroids and IVIG. Rituximab treatment previously interrupted.  Laboratory data from today reviewed and discussed with the patient. She received one dose of IVIG with slight improvement in her platelet count. I anticipate further increase in her platelet count after she completes 2 consecutive days of IVIG. If no response is noted, Nplate would be recommended.  No need for transfusion at this time unless active bleeding is noted.   2.  Acute renal failure with hyperkalemia: Improved at this time. Potassium is down to  5.0 creatinine is closer to baseline. No signs or symptoms of volume overload at this time after IVIG.  3. Follow-up and disposition: We will continue to follow during her hospitalization. Her platelet count increased dramatically in the next 24 hours I have no objections to discharge at that time.   Dr. Pamelia Hoit will follow with you  starting on March 7.   25  minutes were spent on this encounter. More than 50% of the time was dedicated to face-to-face discussion with the patient. The time was spent on reviewing laboratory data, treatment options and future plan of care review.    LOS: 0 days   [name] 03/09/2020, 10:09 AM

## 2020-03-09 NOTE — Progress Notes (Signed)
PROGRESS NOTE    [name]  RDE:[id number] DOB: 29-Nov-1965 DOA: 03/08/2020 PCP: Patient, No Pcp Per   Brief Narrative:  HPI On 03/08/2020 by Dr. Carolyne Littles [name] is a 55 y.o. BF PMHx refractory ITP, anemia, DM type II uncontrolled with complication, s/p RIGHT BKA, essential HTN, intracerebral hemorrhage,  Presents today chronic Refractory ITP with prior multiple treatments including dexamethasone, rituximab x4 in May 2021 and missed few infusion appointments and the previous IVIG infusions. Her last IVIG was given for a total of 95 g given on February 7 and February 12, 2020 after platelet count at that time of 14. Her platelet count on February 24 was 14 and home agency draw her laboratory testing again with a platelet count of 7 and based on these findings she was referred to the emergency department.  Interim history Patient admitted with ITP, and currently being treated with IVIG which we will continue today.  Oncology currently following.  Patient also noted to have acute kidney injury as well as respiratory failure and hyperkalemia on admission. Assessment & Plan   Chronic refractory ITP -Platelets on admission were 7 however then dropped to 6 -Oncology consulted and appreciated -Patient was started on IVIG and will get her second dose today -Platelets currently 10 -Continue to monitor CBC  Acute hypoxic respiratory failure -Patient noted to have SPO2 in the 80s when sleeping -Question if she has undiagnosed sleep apnea -Patient will need outpatient sleep study -Continue oxygen to maintain saturations above 88%  Acute kidney injury -Creatinine on admission was 1.92 (baseline approximately 1-1.2) -Creatinine down to 1.44 today -Continue to monitor BMP  Hyperkalemia -Potassium 6.1 on admission, down to 5 -Continue to monitor BMP  Diabetes mellitus, type II -Hemoglobin A1c 5.1 -Continue insulin sliding scale and CBG monitoring  Essential  hypertension -BP currently stable, continue amlodipine, Imdur, Toprol  PVD -Stable  DVT Prophylaxis SCDs  Code Status: Full  Family Communication: None at bedside  Disposition Plan:  Status is: Observation  The patient will require care spanning > 2 midnights and should be moved to inpatient because: Inpatient level of care appropriate due to severity of illness  Dispo: The patient is from: Home              Anticipated d/c is to: Home              Patient currently is not medically stable to d/c.   Difficult to place patient No  Consultants Oncology hematology  Procedures  None  Antibiotics   Anti-infectives (From admission, onward)   None      Subjective:   [name] seen and examined today.  Patient with no complaints of chest pain, or shortness of breath at this time.  She denies abdominal pain, nausea or vomiting, diarrhea or constipation, dizziness or headache. Objective:   Vitals:   03/09/20 0001 03/09/20 0146 03/09/20 0404 03/09/20 0836  BP: (!) 144/71  (!) 144/59 (!) 147/95  Pulse: 70  68 77  Resp: 16  16 18   Temp: 98.6 F (37 C)  98.7 F (37.1 C) 97.6 F (36.4 C)  TempSrc: Oral  Axillary Oral  SpO2: 97%  93% 92%  Weight:  109 kg    Height:        Intake/Output Summary (Last 24 hours) at 03/09/2020 1105 Last data filed at 03/08/2020 2008 Gross per 24 hour  Intake 360 ml  Output -  Net 360 ml   Filed Weights   03/08/20  1700 03/08/20 1811 03/09/20 0146  Weight: 95.3 kg 108.4 kg 109 kg    Exam  General: Well developed, chronically ill-appearing, NAD  HEENT: NCAT,  mucous membranes moist.   Cardiovascular: S1 S2 auscultated, no rubs, murmurs or gallops. Regular rate and rhythm.  Respiratory: Diminished however clear  Abdomen: Soft, obese, nontender, nondistended, + bowel sounds  Extremities: warm dry without cyanosis clubbing or edema of the LLE.  Right BKA  Neuro: AAOx3, nonfocal  Psych: appropriate mood and affect,  pleasant   Data Reviewed: I have personally reviewed following labs and imaging studies  CBC: Recent Labs  Lab 03/08/20 1109 03/08/20 1751 03/09/20 0505  WBC 4.1  --  2.6*  HGB 9.2*  --  8.2*  HCT 30.7*  --  27.7*  MCV 103.4*  --  102.6*  PLT 7* 6* 10*   Basic Metabolic Panel: Recent Labs  Lab 03/08/20 1109 03/09/20 0505  NA 136 134*  K 6.1* 5.0  CL 105 106  CO2 21* 19*  GLUCOSE 117* 86  BUN 47* 45*  CREATININE 1.92* 1.44*  CALCIUM 8.5* 8.3*  MG  --  2.3  PHOS  --  5.2*   GFR: Estimated Creatinine Clearance: 56.5 mL/min (A) (by C-G formula based on SCr of 1.44 mg/dL (H)). Liver Function Tests: Recent Labs  Lab 03/09/20 0505  AST 17  ALT 12  ALKPHOS 62  BILITOT 0.6  PROT 7.2  ALBUMIN 3.0*   No results for input(s): LIPASE, AMYLASE in the last 168 hours. No results for input(s): AMMONIA in the last 168 hours. Coagulation Profile: No results for input(s): INR, PROTIME in the last 168 hours. Cardiac Enzymes: No results for input(s): CKTOTAL, CKMB, CKMBINDEX, TROPONINI in the last 168 hours. BNP (last 3 results) No results for input(s): PROBNP in the last 8760 hours. HbA1C: Recent Labs    03/09/20 0505  HGBA1C 5.1   CBG: Recent Labs  Lab 03/08/20 1804 03/08/20 2117 03/08/20 2358 03/09/20 0401  GLUCAP 76 121* 85 88   Lipid Profile: Recent Labs    03/08/20 1751  CHOL 104  HDL 66  LDLCALC 28  TRIG 51  CHOLHDL 1.6   Thyroid Function Tests: No results for input(s): TSH, T4TOTAL, FREET4, T3FREE, THYROIDAB in the last 72 hours. Anemia Panel: No results for input(s): VITAMINB12, FOLATE, FERRITIN, TIBC, IRON, RETICCTPCT in the last 72 hours. Urine analysis:    Component Value Date/Time   COLORURINE YELLOW 07/30/2019 2232   APPEARANCEUR CLEAR 07/30/2019 2232   LABSPEC 1.019 07/30/2019 2232   PHURINE 5.0 07/30/2019 2232   GLUCOSEU NEGATIVE 07/30/2019 2232   HGBUR NEGATIVE 07/30/2019 2232   BILIRUBINUR NEGATIVE 07/30/2019 2232   KETONESUR  NEGATIVE 07/30/2019 2232   PROTEINUR 100 (A) 07/30/2019 2232   UROBILINOGEN 2.0 (H) 07/08/2011 0051   NITRITE NEGATIVE 07/30/2019 2232   LEUKOCYTESUR NEGATIVE 07/30/2019 2232   Sepsis Labs: @LABRCNTIP (procalcitonin:4,lacticidven:4)  ) Recent Results (from the past 240 hour(s))  Resp Panel by RT-PCR (Flu A&B, Covid) Nasopharyngeal Swab     Status: None   Collection Time: 03/08/20  6:13 PM   Specimen: Nasopharyngeal Swab; Nasopharyngeal(NP) swabs in vial transport medium  Result Value Ref Range Status   SARS Coronavirus 2 by RT PCR NEGATIVE NEGATIVE Final    Comment: (NOTE) SARS-CoV-2 target nucleic acids are NOT DETECTED.  The SARS-CoV-2 RNA is generally detectable in upper respiratory specimens during the acute phase of infection. The lowest concentration of SARS-CoV-2 viral copies this assay can detect is 138 copies/mL. A  negative result does not preclude SARS-Cov-2 infection and should not be used as the sole basis for treatment or other patient management decisions. A negative result may occur with  improper specimen collection/handling, submission of specimen other than nasopharyngeal swab, presence of viral mutation(s) within the areas targeted by this assay, and inadequate number of viral copies(<138 copies/mL). A negative result must be combined with clinical observations, patient history, and epidemiological information. The expected result is Negative.  Fact Sheet for Patients:  BloggerCourse.com  Fact Sheet for Healthcare Providers:  SeriousBroker.it  This test is no t yet approved or cleared by the [place] FDA and  has been authorized for detection and/or diagnosis of SARS-CoV-2 by FDA under an Emergency Use Authorization (EUA). This EUA will remain  in effect (meaning this test can be used) for the duration of the COVID-19 declaration under Section 564(b)(1) of the Act, 21 U.S.C.section 360bbb-3(b)(1),  unless the authorization is terminated  or revoked sooner.       Influenza A by PCR NEGATIVE NEGATIVE Final   Influenza B by PCR NEGATIVE NEGATIVE Final    Comment: (NOTE) The Xpert Xpress SARS-CoV-2/FLU/RSV plus assay is intended as an aid in the diagnosis of influenza from Nasopharyngeal swab specimens and should not be used as a sole basis for treatment. Nasal washings and aspirates are unacceptable for Xpert Xpress SARS-CoV-2/FLU/RSV testing.  Fact Sheet for Patients: BloggerCourse.com  Fact Sheet for Healthcare Providers: SeriousBroker.it  This test is not yet approved or cleared by the [place] FDA and has been authorized for detection and/or diagnosis of SARS-CoV-2 by FDA under an Emergency Use Authorization (EUA). This EUA will remain in effect (meaning this test can be used) for the duration of the COVID-19 declaration under Section 564(b)(1) of the Act, 21 U.S.C. section 360bbb-3(b)(1), unless the authorization is terminated or revoked.  Performed at [facility], [address], [place], [place] [address]       Radiology Studies: Portable chest 1 View  Result Date: 03/09/2020 CLINICAL DATA:  Shortness of breath.  Hazy bibasilar opacities. EXAM: PORTABLE CHEST 1 VIEW COMPARISON:  March 08, 2020 FINDINGS: Cardiomegaly. No pneumothorax. No nodules or masses. No focal infiltrate seen on today's study. IMPRESSION: Cardiomegaly. No focal infiltrate identified. No acute abnormality noted. Electronically Signed   By: Gerome Sam III M.D   On: 03/09/2020 08:43   DG Chest Portable 1 View  Result Date: 03/08/2020 CLINICAL DATA:  Fatigue and shortness of breath. EXAM: PORTABLE CHEST 1 VIEW COMPARISON:  Single-view of the chest 07/30/2019. PA and lateral chest 05/16/2019. FINDINGS: There is cardiomegaly. Hazy bibasilar airspace disease is worse on the left. No pulmonary edema, pneumothorax or pleural effusion. No  acute or focal bony abnormality. IMPRESSION: Hazy bibasilar airspace disease is worse on the left and could be due to atelectasis or pneumonia. Cardiomegaly without edema. Electronically Signed   By: Drusilla Kanner M.D.   On: 03/08/2020 13:02     Scheduled Meds: . amLODipine  5 mg Oral Daily  . COVID-19 mRNA Vac-TriS (Pfizer)  0.3 mL Intramuscular ONCE-1600  . gabapentin  400 mg Oral BID  . insulin aspart  0-9 Units Subcutaneous Q4H  . isosorbide mononitrate  30 mg Oral Daily   Continuous Infusions: . IMMUNE GLOBULIN 10% (HUMAN) IV - For Fluid Restriction Only 235 mL/hr at 03/08/20 2134     LOS: 0 days   Time Spent in minutes   45 minutes  Maryann Mikhail D.O. on 03/09/2020 at 11:05 AM  Between 7am to 7pm - Please see pager noted on [email or web address]  After 7pm go to [email or web address]  And look for the night coverage person covering for me after hours  [facility] Office  [phone]

## 2020-03-10 ENCOUNTER — Telehealth: Payer: Self-pay

## 2020-03-10 DIAGNOSIS — E1165 Type 2 diabetes mellitus with hyperglycemia: Secondary | ICD-10-CM | POA: Diagnosis not present

## 2020-03-10 DIAGNOSIS — I5022 Chronic systolic (congestive) heart failure: Secondary | ICD-10-CM | POA: Diagnosis not present

## 2020-03-10 DIAGNOSIS — E118 Type 2 diabetes mellitus with unspecified complications: Secondary | ICD-10-CM

## 2020-03-10 LAB — COMPREHENSIVE METABOLIC PANEL
ALT: 10 U/L (ref 0–44)
AST: 18 U/L (ref 15–41)
Albumin: 2.9 g/dL — ABNORMAL LOW (ref 3.5–5.0)
Alkaline Phosphatase: 55 U/L (ref 38–126)
Anion gap: 9 (ref 5–15)
BUN: 48 mg/dL — ABNORMAL HIGH (ref 6–20)
CO2: 17 mmol/L — ABNORMAL LOW (ref 22–32)
Calcium: 8.5 mg/dL — ABNORMAL LOW (ref 8.9–10.3)
Chloride: 107 mmol/L (ref 98–111)
Creatinine, Ser: 1.33 mg/dL — ABNORMAL HIGH (ref 0.44–1.00)
GFR, Estimated: 48 mL/min — ABNORMAL LOW (ref >60)
Glucose, Bld: 91 mg/dL (ref 70–99)
Potassium: 4.9 mmol/L (ref 3.5–5.1)
Sodium: 133 mmol/L — ABNORMAL LOW (ref 135–145)
Total Bilirubin: 0.6 mg/dL (ref 0.3–1.2)
Total Protein: 8 g/dL (ref 6.5–8.1)

## 2020-03-10 LAB — GLUCOSE, CAPILLARY
Glucose-Capillary: 94 mg/dL (ref 70–99)
Glucose-Capillary: 100 mg/dL — ABNORMAL HIGH (ref 70–99)
Glucose-Capillary: 146 mg/dL — ABNORMAL HIGH (ref 70–99)
Glucose-Capillary: 141 mg/dL — ABNORMAL HIGH (ref 70–99)
Glucose-Capillary: 146 mg/dL — ABNORMAL HIGH (ref 70–99)

## 2020-03-10 LAB — CBC
HCT: 26.3 % — ABNORMAL LOW (ref 36.0–46.0)
Hemoglobin: 8.3 g/dL — ABNORMAL LOW (ref 12.0–15.0)
MCH: 31.4 pg (ref 26.0–34.0)
MCHC: 31.6 g/dL (ref 30.0–36.0)
MCV: 99.6 fL (ref 80.0–100.0)
Platelets: 13 10*3/uL — CL (ref 150–400)
RBC: 2.64 MIL/uL — ABNORMAL LOW (ref 3.87–5.11)
RDW: 16.9 % — ABNORMAL HIGH (ref 11.5–15.5)
WBC: 2.5 10*3/uL — ABNORMAL LOW (ref 4.0–10.5)
nRBC: 0 % (ref 0.0–0.2)

## 2020-03-10 LAB — MAGNESIUM: Magnesium: 2.3 mg/dL (ref 1.7–2.4)

## 2020-03-10 LAB — PHOSPHORUS: Phosphorus: 4.3 mg/dL (ref 2.5–4.6)

## 2020-03-10 MED ORDER — ROMIPLOSTIM 125 MCG ~~LOC~~ SOLR
1.0000 ug/kg | SUBCUTANEOUS | Status: DC
  Administered 2020-03-10: 110 ug via SUBCUTANEOUS
  Filled 2020-03-10 (×2): qty 0.22

## 2020-03-10 NOTE — Progress Notes (Signed)
PROGRESS NOTE    [name]  WVP:[id number] DOB: 12/14/1965 DOA: 03/08/2020 PCP: Patient, No Pcp Per   Brief Narrative:  HPI On 03/08/2020 by Dr. Carolyne Littles [name] is a 55 y.o. BF PMHx refractory ITP, anemia, DM type II uncontrolled with complication, s/p RIGHT BKA, essential HTN, intracerebral hemorrhage,  Presents today chronic Refractory ITP with prior multiple treatments including dexamethasone, rituximab x4 in May 2021 and missed few infusion appointments and the previous IVIG infusions. Her last IVIG was given for a total of 95 g given on February 7 and February 12, 2020 after platelet count at that time of 14. Her platelet count on February 24 was 14 and home agency draw her laboratory testing again with a platelet count of 7 and based on these findings she was referred to the emergency department.  Interim history Patient admitted with ITP, and currently being treated with IVIG which we will continue today.  Oncology currently following.  Patient also noted to have acute kidney injury as well as respiratory failure and hyperkalemia on admission.  Despite 2 treatments of IVIG, platelets have minimally improved.  Discussed with Dr. Georgiann Mohs, recommended surgical consult for possible splenectomy. Assessment & Plan   Chronic refractory ITP -Platelets on admission were 7 however then dropped to 6 -Oncology consulted and appreciated -Despite receiving 2 treatments of IVIG, platelets have minimally improved -Platelets currently 13 -Discussed with Dr. Pamelia Hoit, recommended possibly Nplate versus splenectomy. -General surgery consulted and appreciated for possible splenectomy -Continue to monitor CBC  Acute hypoxic respiratory failure -Patient noted to have SPO2 in the 80s when sleeping -Question if she has undiagnosed sleep apnea -Patient will need outpatient sleep study -Continue oxygen to maintain saturations above 88%  Acute kidney injury -Creatinine on admission was  1.92 (baseline approximately 1-1.2) -Creatinine down to 1.33 today -Continue to monitor BMP  Hyperkalemia -Potassium 6.1 on admission, down to 4.9 -Continue to monitor BMP  Diabetes mellitus, type II -Hemoglobin A1c 5.1 -Continue insulin sliding scale and CBG monitoring  Essential hypertension -BP currently stable, continue amlodipine, Imdur, Toprol  PVD -Stable  Left foot wounds  -present on admission -Wound care consulted and appreciated -Patient states that she also follows up with the outpatient wound center  DVT Prophylaxis SCDs  Code Status: Full  Family Communication: None at bedside  Disposition Plan:  Status is: Inpatient  Remains inpatient appropriate because:IV treatments appropriate due to intensity of illness or inability to take PO and Inpatient level of care appropriate due to severity of illness   Dispo: The patient is from: Home              Anticipated d/c is to: Home              Patient currently is not medically stable to d/c.   Difficult to place patient No  Consultants Oncology hematology General surgery  Procedures  None  Antibiotics   Anti-infectives (From admission, onward)   None      Subjective:   [name] seen and examined today.  Patient with no complaints of chest pain, shortness of breath, abdominal pain, nausea vomiting, diarrhea constipation, dizziness or headache at this time.  Concerned that her platelets have not rebounded as they normally do. Objective:   Vitals:   03/09/20 1940 03/10/20 0112 03/10/20 0743 03/10/20 1151  BP: (!) 146/68  (!) 156/81 (!) 157/73  Pulse: 82  83 83  Resp: 18  18 18   Temp: 98.6 F (37 C)  98.1  F (36.7 C) 98.3 F (36.8 C)  TempSrc: Oral  Oral Oral  SpO2: 98%  94% 92%  Weight:  110.8 kg    Height:        Intake/Output Summary (Last 24 hours) at 03/10/2020 1327 Last data filed at 03/10/2020 0747 Gross per 24 hour  Intake 597 ml  Output 2400 ml  Net -1803 ml   Filed Weights    03/08/20 1811 03/09/20 0146 03/10/20 0112  Weight: 108.4 kg 109 kg 110.8 kg   Exam  General: Well developed, chronically ill-appearing, NAD  HEENT: NCAT, mucous membranes moist.   Cardiovascular: S1 S2 auscultated, RRR  Respiratory: Clear to auscultation bilaterally   Abdomen: Soft, nontender, nondistended, + bowel sounds  Extremities: warm dry without cyanosis clubbing or edema of LLE.  Right BKA  Neuro: AAOx3, nonfocal  Psych: Appropriate mood and affect, pleasant   Data Reviewed: I have personally reviewed following labs and imaging studies  CBC: Recent Labs  Lab 03/08/20 1109 03/08/20 1751 03/09/20 0505 03/10/20 0442  WBC 4.1  --  2.6* 2.5*  HGB 9.2*  --  8.2* 8.3*  HCT 30.7*  --  27.7* 26.3*  MCV 103.4*  --  102.6* 99.6  PLT 7* 6* 10* 13*   Basic Metabolic Panel: Recent Labs  Lab 03/08/20 1109 03/09/20 0505 03/10/20 0442  NA 136 134* 133*  K 6.1* 5.0 4.9  CL 105 106 107  CO2 21* 19* 17*  GLUCOSE 117* 86 91  BUN 47* 45* 48*  CREATININE 1.92* 1.44* 1.33*  CALCIUM 8.5* 8.3* 8.5*  MG  --  2.3 2.3  PHOS  --  5.2* 4.3   GFR: Estimated Creatinine Clearance: 61.7 mL/min (A) (by C-G formula based on SCr of 1.33 mg/dL (H)). Liver Function Tests: Recent Labs  Lab 03/09/20 0505 03/10/20 0442  AST 17 18  ALT 12 10  ALKPHOS 62 55  BILITOT 0.6 0.6  PROT 7.2 8.0  ALBUMIN 3.0* 2.9*   No results for input(s): LIPASE, AMYLASE in the last 168 hours. No results for input(s): AMMONIA in the last 168 hours. Coagulation Profile: No results for input(s): INR, PROTIME in the last 168 hours. Cardiac Enzymes: No results for input(s): CKTOTAL, CKMB, CKMBINDEX, TROPONINI in the last 168 hours. BNP (last 3 results) No results for input(s): PROBNP in the last 8760 hours. HbA1C: Recent Labs    03/09/20 0505  HGBA1C 5.1   CBG: Recent Labs  Lab 03/09/20 2004 03/09/20 2310 03/10/20 0500 03/10/20 0740 03/10/20 1149  GLUCAP 145* 115* 94 100* 146*   Lipid  Profile: Recent Labs    03/08/20 1751  CHOL 104  HDL 66  LDLCALC 28  TRIG 51  CHOLHDL 1.6   Thyroid Function Tests: No results for input(s): TSH, T4TOTAL, FREET4, T3FREE, THYROIDAB in the last 72 hours. Anemia Panel: No results for input(s): VITAMINB12, FOLATE, FERRITIN, TIBC, IRON, RETICCTPCT in the last 72 hours. Urine analysis:    Component Value Date/Time   COLORURINE YELLOW 03/09/2020 1624   APPEARANCEUR HAZY (A) 03/09/2020 1624   LABSPEC 1.010 03/09/2020 1624   PHURINE 7.0 03/09/2020 1624   GLUCOSEU NEGATIVE 03/09/2020 1624   HGBUR SMALL (A) 03/09/2020 1624   BILIRUBINUR NEGATIVE 03/09/2020 1624   KETONESUR NEGATIVE 03/09/2020 1624   PROTEINUR 30 (A) 03/09/2020 1624   UROBILINOGEN 2.0 (H) 07/08/2011 0051   NITRITE NEGATIVE 03/09/2020 1624   LEUKOCYTESUR LARGE (A) 03/09/2020 1624   Sepsis Labs: @LABRCNTIP (procalcitonin:4,lacticidven:4)  ) Recent Results (from the past 240 hour(s))  Resp Panel by RT-PCR (Flu A&B, Covid) Nasopharyngeal Swab     Status: None   Collection Time: 03/08/20  6:13 PM   Specimen: Nasopharyngeal Swab; Nasopharyngeal(NP) swabs in vial transport medium  Result Value Ref Range Status   SARS Coronavirus 2 by RT PCR NEGATIVE NEGATIVE Final    Comment: (NOTE) SARS-CoV-2 target nucleic acids are NOT DETECTED.  The SARS-CoV-2 RNA is generally detectable in upper respiratory specimens during the acute phase of infection. The lowest concentration of SARS-CoV-2 viral copies this assay can detect is 138 copies/mL. A negative result does not preclude SARS-Cov-2 infection and should not be used as the sole basis for treatment or other patient management decisions. A negative result may occur with  improper specimen collection/handling, submission of specimen other than nasopharyngeal swab, presence of viral mutation(s) within the areas targeted by this assay, and inadequate number of viral copies(<138 copies/mL). A negative result must be combined  with clinical observations, patient history, and epidemiological information. The expected result is Negative.  Fact Sheet for Patients:  BloggerCourse.com  Fact Sheet for Healthcare Providers:  SeriousBroker.it  This test is no t yet approved or cleared by the [place] FDA and  has been authorized for detection and/or diagnosis of SARS-CoV-2 by FDA under an Emergency Use Authorization (EUA). This EUA will remain  in effect (meaning this test can be used) for the duration of the COVID-19 declaration under Section 564(b)(1) of the Act, 21 U.S.C.section 360bbb-3(b)(1), unless the authorization is terminated  or revoked sooner.       Influenza A by PCR NEGATIVE NEGATIVE Final   Influenza B by PCR NEGATIVE NEGATIVE Final    Comment: (NOTE) The Xpert Xpress SARS-CoV-2/FLU/RSV plus assay is intended as an aid in the diagnosis of influenza from Nasopharyngeal swab specimens and should not be used as a sole basis for treatment. Nasal washings and aspirates are unacceptable for Xpert Xpress SARS-CoV-2/FLU/RSV testing.  Fact Sheet for Patients: BloggerCourse.com  Fact Sheet for Healthcare Providers: SeriousBroker.it  This test is not yet approved or cleared by the [place] FDA and has been authorized for detection and/or diagnosis of SARS-CoV-2 by FDA under an Emergency Use Authorization (EUA). This EUA will remain in effect (meaning this test can be used) for the duration of the COVID-19 declaration under Section 564(b)(1) of the Act, 21 U.S.C. section 360bbb-3(b)(1), unless the authorization is terminated or revoked.  Performed at [facility], [address], [place], [place] [address]       Radiology Studies: Portable chest 1 View  Result Date: 03/09/2020 CLINICAL DATA:  Shortness of breath.  Hazy bibasilar opacities. EXAM: PORTABLE CHEST 1 VIEW COMPARISON:   March 08, 2020 FINDINGS: Cardiomegaly. No pneumothorax. No nodules or masses. No focal infiltrate seen on today's study. IMPRESSION: Cardiomegaly. No focal infiltrate identified. No acute abnormality noted. Electronically Signed   By: Gerome Sam III M.D   On: 03/09/2020 08:43     Scheduled Meds: . amLODipine  5 mg Oral Daily  . COVID-19 mRNA Vac-TriS (Pfizer)  0.3 mL Intramuscular ONCE-1600  . gabapentin  400 mg Oral BID  . insulin aspart  0-9 Units Subcutaneous Q4H  . isosorbide mononitrate  30 mg Oral Daily  . romiPLOStim  1 mcg/kg Subcutaneous Q Mon   Continuous Infusions:    LOS: 1 day   Time Spent in minutes   45 minutes  Danette Weinfeld D.O. on 03/10/2020 at 1:27 PM  Between 7am to 7pm - Please see pager noted on  [email or web address]  After 7pm go to [email or web address]  And look for the night coverage person covering for me after hours  [facility] Office  [phone]

## 2020-03-10 NOTE — Telephone Encounter (Signed)
Received VM from Deena at [facility] with critical lab values -- platelets 5 and K+ 6.2. Returned call to confirm receipt. Made MD aware.

## 2020-03-10 NOTE — Plan of Care (Signed)
  Problem: Clinical Measurements: Goal: Cardiovascular complication will be avoided Outcome: Progressing   Problem: Coping: Goal: Level of anxiety will decrease Outcome: Progressing   Problem: Pain Managment: Goal: General experience of comfort will improve Outcome: Progressing   

## 2020-03-10 NOTE — H&P (Deleted)
Consult Note  [name] 1965/05/21  [id number].    Requesting MD: Dr. Cristal Ford Chief Complaint/Reason for Consult: Refractory ITP  HPI:  Patient is a 55 year old female who presented to [facility] 03/08/20 for severe thrombocytopenia noted on home lab draw. Patient has known chronic refractory ITP and platelets were 14K on 2/24 and repeat check showed they had decreased to [place]. She reports she started going to the cancer center for management of ITP about 2 years ago. Patient reports occasional nosebleeds although recently she has not had any major bleeding episodes. She does report increased bruising recently. She denies hemoptysis, GI bleeding or bleeding from gums. She denies abdominal pain. She has been given 2 doses of IVIG while admitted and platelets are 13 K from 6K. No platelet infusions. General surgery asked to see for possible splenectomy.  PMH otherwise significant for T2DM, Hx of osteomyelitis s/p R BKA, HTN, Hx of CVA. Past abdominal surgery includes cesarean section x2 with tubal ligation. NKDA. She reports occasional glass of wine, denies tobacco or illicit drug use. She does not work.   ROS: Review of Systems  Respiratory: Negative for shortness of breath and wheezing.   Cardiovascular: Negative for chest pain and palpitations.  Gastrointestinal: Negative for abdominal pain, nausea and vomiting.  Genitourinary: Negative for dysuria, frequency and urgency.  Endo/Heme/Allergies: Bruises/bleeds easily.  All other systems reviewed and are negative.   Family History  Problem Relation Age of Onset  . Diabetes Mother   . Hypertension Mother   . Hyperlipidemia Mother   . Diabetes Father   . Cancer Father        prostate  . Hyperlipidemia Father   . Hypertension Father   . Diabetes Brother   . Peripheral Artery Disease Brother        has had toes amputated  . Diabetes Son     Past Medical History:  Diagnosis Date  . Acute exacerbation of CHF  (congestive heart failure) ([place]) 04/11/2019  . Anemia   . Arthritis    "spine, hands" (11/25/2015)  . Back pain    "all my back; probably 3 times/week" (11/25/2015)  . Chronic indwelling Foley catheter 03/04/2017  . Diabetic foot ulcer associated with type 2 diabetes mellitus ([place])   . Hypertension   . Intracerebral hemorrhage ([place]) As a teenager    States she had burst blood vessel as teenager, now with resultant minor visual field and hearing deficits  . Neuropathy   . Stroke [facility]) ~ 1982   "my feeling on my RLE, right lower eye vision,  & hearing out of my right ear not the same since" (11/25/2015)  . Type II diabetes mellitus ([place])     Past Surgical History:  Procedure Laterality Date  . AMPUTATION  11/24/2010   Procedure: AMPUTATION FOOT;  Surgeon: Wylene Simmer, MD;  Location: [place];  Service: Orthopedics;  Laterality: Right;  Right  FOOT TRANS METATARSAL AMPUTATION  . AMPUTATION  07/02/2011   Procedure: AMPUTATION BELOW KNEE;  Surgeon: Wylene Simmer, MD;  Location: [facility];  Service: Orthopedics;  Laterality: Right;  . APPLICATION OF WOUND VAC  07/02/2011   Procedure: APPLICATION OF WOUND VAC;  Surgeon: Wylene Simmer, MD;  Location: [place];  Service: Orthopedics;  Laterality: Right;  . BRAIN SURGERY  1980's   Previous brain surgery in [facility], [place]  . CESAREAN SECTION  2004; 2006  . COLONOSCOPY N/A 09/17/2015   Procedure: COLONOSCOPY;  Surgeon: Wilford Corner, MD;  Location[facility];  Service: Endoscopy;  Laterality: N/A;  . ESOPHAGOGASTRODUODENOSCOPY N/A 09/17/2015   Procedure: ESOPHAGOGASTRODUODENOSCOPY (EGD);  Surgeon: Wilford Corner, MD;  Location[facility] ENDOSCOPY;  Service: Endoscopy;  Laterality: N/A;  . I & D EXTREMITY  11/24/2010   Procedure: IRRIGATION AND DEBRIDEMENT EXTREMITY;  Surgeon: Wylene Simmer, MD;  Location: [place];  Service: Orthopedics;  Laterality: Left;  Debriedment of left plantar ulcer and trimming of toenails  . I & D EXTREMITY  07/02/2011   Procedure: IRRIGATION  AND DEBRIDEMENT EXTREMITY;  Surgeon: Wylene Simmer, MD;  Location: [facility];  Service: Orthopedics;  Laterality: Left;  I & D of Left Foot  . I & D EXTREMITY  07/06/2011   Procedure: IRRIGATION AND DEBRIDEMENT EXTREMITY;  Surgeon: Wylene Simmer, MD;  Location: [facility];  Service: Orthopedics;  Laterality: Right;  IRRIGATION/DEBRIDEMENT RIGHT BELOW KNEE AMPUTATION  . TUBAL LIGATION  2006    Social History:  reports that she has never smoked. She has never used smokeless tobacco. She reports current alcohol use of about 2.0 standard drinks of alcohol per week. She reports that she does not use drugs.  Allergies: No Known Allergies  Medications Prior to Admission  Medication Sig Dispense Refill  . acetaminophen (TYLENOL) 500 MG tablet Take 1,000 mg by mouth as needed (pain).    Marland Kitchen amLODipine (NORVASC) 5 MG tablet Take 1 tablet (5 mg total) by mouth daily. 90 tablet 3  . aspirin EC 81 MG tablet Take 81 mg by mouth daily as needed for moderate pain. Swallow whole.    . Blood Glucose Monitoring Suppl (PRODIGY AUTOCODE BLOOD GLUCOSE) w/Device KIT 1 kit by Does not apply route in the morning, at noon, in the evening, and at bedtime. 1 kit 0  . gabapentin (NEURONTIN) 400 MG capsule Take 1 capsule (400 mg total) by mouth 2 (two) times daily. 180 capsule 3  . isosorbide mononitrate (IMDUR) 30 MG 24 hr tablet Take 1 tablet (30 mg total) by mouth daily. 90 tablet 3  . metFORMIN (GLUCOPHAGE) 500 MG tablet Take 1 tablet (500 mg total) by mouth 2 (two) times daily with a meal. 180 tablet 3  . metoprolol succinate (TOPROL-XL) 50 MG 24 hr tablet Take 1 tablet (50 mg total) by mouth daily. Take with or immediately following a meal. 90 tablet 3  . rosuvastatin (CRESTOR) 20 MG tablet Take 1 tablet (20 mg total) by mouth daily. 90 tablet 3    Blood pressure (!) 157/73, pulse 83, temperature 98.3 F (36.8 C), temperature source Oral, resp. rate 18, height '5\' 6"'  (1.676 m), weight 110.8 kg, SpO2 92 %. Physical Exam:  General:  pleasant, WD, obese female who is laying in bed in NAD HEENT: head is normocephalic, atraumatic.  Sclera are noninjected.  PERRL.  Ears and nose without any masses or lesions.  Mouth is pink and moist Heart: regular, rate, and rhythm.  Normal s1,s2. No obvious murmurs, gallops, or rubs noted.  Palpable radial and pedal pulses bilaterally Lungs: CTAB, no wheezes, rhonchi, or rales noted.  Respiratory effort nonlabored Abd: soft, NT, ND, +BS, no masses, hernias, or organomegaly MS: R BKA, dressing to LLE for diabetic foot wound Skin: warm and dry, various bruises scattered over extremities Neuro: Cranial nerves 2-12 grossly intact, sensation is normal throughout Psych: A&Ox3 with an appropriate affect.   Results for orders placed or performed during the hospital encounter of 03/08/20 (from the past 48 hour(s))  Troponin I (High Sensitivity)     Status: None  Collection Time: 03/08/20  1:05 PM  Result Value Ref Range   Troponin I (High Sensitivity) 13 <18 ng/L    Comment: (NOTE) Elevated high sensitivity troponin I (hsTnI) values and significant  changes across serial measurements may suggest ACS but many other  chronic and acute conditions are known to elevate hsTnI results.  Refer to the "Links" section for chest pain algorithms and additional  guidance. Performed at [facility], [place] [address], [place], [place] [address]   Brain natriuretic peptide     Status: Abnormal   Collection Time: 03/08/20  1:09 PM  Result Value Ref Range   B Natriuretic Peptide 824.4 (H) 0.0 - 100.0 pg/mL    Comment: Performed at [facility] [address], [place], [place] [address]  Type and screen [place]     Status: None   Collection Time: 03/08/20  2:26 PM  Result Value Ref Range   ABO/RH(D) B POS    Antibody Screen NEG    Sample Expiration      03/11/2020,2359 Performed at [facility], [place] [address] [place], [place] [address]   Troponin I (High  Sensitivity)     Status: None   Collection Time: 03/08/20  5:51 PM  Result Value Ref Range   Troponin I (High Sensitivity) 14 <18 ng/L    Comment: (NOTE) Elevated high sensitivity troponin I (hsTnI) values and significant  changes across serial measurements may suggest ACS but many other  chronic and acute conditions are known to elevate hsTnI results.  Refer to the "Links" section for chest pain algorithms and additional  guidance. Performed at [facility], [place] [address], [place], [place] [address]   HIV Antibody (routine testing w rflx)     Status: None   Collection Time: 03/08/20  5:51 PM  Result Value Ref Range   HIV Screen 4th Generation wRfx Non Reactive Non Reactive    Comment: Performed at [facility], [place] [address] [place], [place] [address]  Platelet count     Status: Abnormal   Collection Time: 03/08/20  5:51 PM  Result Value Ref Range   Platelets 6 (LL) 150 - 400 K/uL    Comment: CRITICAL VALUE NOTED.  VALUE IS CONSISTENT WITH PREVIOUSLY REPORTED AND CALLED VALUE. REPEATED TO VERIFY Performed at [facility], [place] [address] [place],  [address]   Lipid panel     Status: None   Collection Time: 03/08/20  5:51 PM  Result Value Ref Range   Cholesterol 104 0 - 200 mg/dL   Triglycerides 51 <150 mg/dL   HDL 66 >40 mg/dL   Total CHOL/HDL Ratio 1.6 RATIO   VLDL 10 0 - 40 mg/dL   LDL Cholesterol 28 0 - 99 mg/dL    Comment:        Total Cholesterol/HDL:CHD Risk Coronary Heart Disease Risk Table                     Men   Women  1/2 Average Risk   3.4   3.3  Average Risk       5.0   4.4  2 X Average Risk   9.6   7.1  3 X Average Risk  23.4   11.0        Use the calculated Patient Ratio above and the CHD Risk Table to determine the patient's CHD Risk.        ATP  III CLASSIFICATION (LDL):  <100     mg/dL   Optimal  100-129  mg/dL   Near or Above                    Optimal  130-159  mg/dL   Borderline  160-189  mg/dL   High  >190      mg/dL   Very High Performed at [facility] [address], [place], [place] [address]   Glucose, capillary     Status: None   Collection Time: 03/08/20  6:04 PM  Result Value Ref Range   Glucose-Capillary 76 70 - 99 mg/dL    Comment: Glucose reference range applies only to samples taken after fasting for at least 8 hours.  Resp Panel by RT-PCR (Flu A&B, Covid) Nasopharyngeal Swab     Status: None   Collection Time: 03/08/20  6:13 PM   Specimen: Nasopharyngeal Swab; Nasopharyngeal(NP) swabs in vial transport medium  Result Value Ref Range   SARS Coronavirus 2 by RT PCR NEGATIVE NEGATIVE    Comment: (NOTE) SARS-CoV-2 target nucleic acids are NOT DETECTED.  The SARS-CoV-2 RNA is generally detectable in upper respiratory specimens during the acute phase of infection. The lowest concentration of SARS-CoV-2 viral copies this assay can detect is 138 copies/mL. A negative result does not preclude SARS-Cov-2 infection and should not be used as the sole basis for treatment or other patient management decisions. A negative result may occur with  improper specimen collection/handling, submission of specimen other than nasopharyngeal swab, presence of viral mutation(s) within the areas targeted by this assay, and inadequate number of viral copies(<138 copies/mL). A negative result must be combined with clinical observations, patient history, and epidemiological information. The expected result is Negative.  Fact Sheet for Patients:  EntrepreneurPulse.com.au  Fact Sheet for Healthcare Providers:  IncredibleEmployment.be  This test is no t yet approved or cleared by the [place] FDA and  has been authorized for detection and/or diagnosis of SARS-CoV-2 by FDA under an Emergency Use Authorization (EUA). This EUA will remain  in effect (meaning this test can be used) for the duration of the COVID-19 declaration under Section 564(b)(1) of the Act,  21 U.S.C.section 360bbb-3(b)(1), unless the authorization is terminated  or revoked sooner.       Influenza A by PCR NEGATIVE NEGATIVE   Influenza B by PCR NEGATIVE NEGATIVE    Comment: (NOTE) The Xpert Xpress SARS-CoV-2/FLU/RSV plus assay is intended as an aid in the diagnosis of influenza from Nasopharyngeal swab specimens and should not be used as a sole basis for treatment. Nasal washings and aspirates are unacceptable for Xpert Xpress SARS-CoV-2/FLU/RSV testing.  Fact Sheet for Patients: EntrepreneurPulse.com.au  Fact Sheet for Healthcare Providers: IncredibleEmployment.be  This test is not yet approved or cleared by the [place] FDA and has been authorized for detection and/or diagnosis of SARS-CoV-2 by FDA under an Emergency Use Authorization (EUA). This EUA will remain in effect (meaning this test can be used) for the duration of the COVID-19 declaration under Section 564(b)(1) of the Act, 21 U.S.C. section 360bbb-3(b)(1), unless the authorization is terminated or revoked.  Performed at [facility], [place] [address] [place], [place] [address]   Glucose, capillary     Status: Abnormal   Collection Time: 03/08/20  9:17 PM  Result Value Ref Range   Glucose-Capillary 121 (H) 70 - 99 mg/dL    Comment: Glucose reference range applies only to samples taken after fasting for at  least 8 hours.   Comment 1 Notify RN    Comment 2 Document in Chart   Glucose, capillary     Status: None   Collection Time: 03/08/20 11:58 PM  Result Value Ref Range   Glucose-Capillary 85 70 - 99 mg/dL    Comment: Glucose reference range applies only to samples taken after fasting for at least 8 hours.  Glucose, capillary     Status: None   Collection Time: 03/09/20  4:01 AM  Result Value Ref Range   Glucose-Capillary 88 70 - 99 mg/dL    Comment: Glucose reference range applies only to samples taken after fasting for at least 8 hours.   Comprehensive metabolic panel     Status: Abnormal   Collection Time: 03/09/20  5:05 AM  Result Value Ref Range   Sodium 134 (L) 135 - 145 mmol/L   Potassium 5.0 3.5 - 5.1 mmol/L   Chloride 106 98 - 111 mmol/L   CO2 19 (L) 22 - 32 mmol/L   Glucose, Bld 86 70 - 99 mg/dL    Comment: Glucose reference range applies only to samples taken after fasting for at least 8 hours.   BUN 45 (H) 6 - 20 mg/dL   Creatinine, Ser 1.44 (H) 0.44 - 1.00 mg/dL   Calcium 8.3 (L) 8.9 - 10.3 mg/dL   Total Protein 7.2 6.5 - 8.1 g/dL   Albumin 3.0 (L) 3.5 - 5.0 g/dL   AST 17 15 - 41 U/L   ALT 12 0 - 44 U/L   Alkaline Phosphatase 62 38 - 126 U/L   Total Bilirubin 0.6 0.3 - 1.2 mg/dL   GFR, Estimated 43 (L) >60 mL/min    Comment: (NOTE) Calculated using the CKD-EPI Creatinine Equation (2021)    Anion gap 9 5 - 15    Comment: Performed at [facility], [place] [address], [place], [place] [address]  Magnesium     Status: None   Collection Time: 03/09/20  5:05 AM  Result Value Ref Range   Magnesium 2.3 1.7 - 2.4 mg/dL    Comment: Performed at [facility] [address], [place], [place] [address]  Phosphorus     Status: Abnormal   Collection Time: 03/09/20  5:05 AM  Result Value Ref Range   Phosphorus 5.2 (H) 2.5 - 4.6 mg/dL    Comment: Performed at [facility] [address] [place], [place] [address]  CBC     Status: Abnormal   Collection Time: 03/09/20  5:05 AM  Result Value Ref Range   WBC 2.6 (L) 4.0 - 10.5 K/uL   RBC 2.70 (L) 3.87 - 5.11 MIL/uL   Hemoglobin 8.2 (L) 12.0 - 15.0 g/dL   HCT 27.7 (L) 36.0 - 46.0 %   MCV 102.6 (H) 80.0 - 100.0 fL   MCH 30.4 26.0 - 34.0 pg   MCHC 29.6 (L) 30.0 - 36.0 g/dL   RDW 17.1 (H) 11.5 - 15.5 %   Platelets 10 (LL) 150 - 400 K/uL    Comment: Immature Platelet Fraction may be clinically indicated, consider ordering this additional test [id number] CRITICAL VALUE NOTED.  VALUE IS CONSISTENT WITH PREVIOUSLY REPORTED AND CALLED VALUE. REPEATED  TO VERIFY    nRBC 0.0 0.0 - 0.2 %    Comment: Performed at [facility], [place] [address] [place], [place] [address]  Hemoglobin A1c     Status: None   Collection Time: 03/09/20  5:05 AM  Result  Value Ref Range   Hgb A1c MFr Bld 5.1 4.8 - 5.6 %    Comment: (NOTE) Pre diabetes:          5.7%-6.4%  Diabetes:              >6.4%  Glycemic control for   <7.0% adults with diabetes    Mean Plasma Glucose 99.67 mg/dL    Comment: Performed at [facility] [address] [place], [place] [address]  Glucose, capillary     Status: Abnormal   Collection Time: 03/09/20 11:43 AM  Result Value Ref Range   Glucose-Capillary 111 (H) 70 - 99 mg/dL    Comment: Glucose reference range applies only to samples taken after fasting for at least 8 hours.  Urinalysis, Routine w reflex microscopic Urine, Clean Catch     Status: Abnormal   Collection Time: 03/09/20  4:24 PM  Result Value Ref Range   Color, Urine YELLOW YELLOW   APPearance HAZY (A) CLEAR   Specific Gravity, Urine 1.010 1.005 - 1.030   pH 7.0 5.0 - 8.0   Glucose, UA NEGATIVE NEGATIVE mg/dL   Hgb urine dipstick SMALL (A) NEGATIVE   Bilirubin Urine NEGATIVE NEGATIVE   Ketones, ur NEGATIVE NEGATIVE mg/dL   Protein, ur 30 (A) NEGATIVE mg/dL   Nitrite NEGATIVE NEGATIVE   Leukocytes,Ua LARGE (A) NEGATIVE   RBC / HPF 0-5 0 - 5 RBC/hpf   WBC, UA 21-50 0 - 5 WBC/hpf   Bacteria, UA NONE SEEN NONE SEEN   Squamous Epithelial / LPF 0-5 0 - 5    Comment: Performed at [facility], [address], [place], [place] [address]  Glucose, capillary     Status: Abnormal   Collection Time: 03/09/20  4:30 PM  Result Value Ref Range   Glucose-Capillary 131 (H) 70 - 99 mg/dL    Comment: Glucose reference range applies only to samples taken after fasting for at least 8 hours.  Glucose, capillary     Status: Abnormal   Collection Time: 03/09/20  8:04 PM  Result Value Ref Range   Glucose-Capillary 145 (H) 70 - 99 mg/dL    Comment: Glucose  reference range applies only to samples taken after fasting for at least 8 hours.  Glucose, capillary     Status: Abnormal   Collection Time: 03/09/20 11:10 PM  Result Value Ref Range   Glucose-Capillary 115 (H) 70 - 99 mg/dL    Comment: Glucose reference range applies only to samples taken after fasting for at least 8 hours.  Comprehensive metabolic panel     Status: Abnormal   Collection Time: 03/10/20  4:42 AM  Result Value Ref Range   Sodium 133 (L) 135 - 145 mmol/L   Potassium 4.9 3.5 - 5.1 mmol/L   Chloride 107 98 - 111 mmol/L   CO2 17 (L) 22 - 32 mmol/L   Glucose, Bld 91 70 - 99 mg/dL    Comment: Glucose reference range applies only to samples taken after fasting for at least 8 hours.   BUN 48 (H) 6 - 20 mg/dL   Creatinine, Ser 1.33 (H) 0.44 - 1.00 mg/dL   Calcium 8.5 (L) 8.9 - 10.3 mg/dL   Total Protein 8.0 6.5 - 8.1 g/dL   Albumin 2.9 (L) 3.5 - 5.0 g/dL   AST 18 15 - 41 U/L   ALT 10 0 - 44 U/L   Alkaline Phosphatase 55 38 - 126 U/L   Total Bilirubin 0.6 0.3 -  1.2 mg/dL   GFR, Estimated 48 (L) >60 mL/min    Comment: (NOTE) Calculated using the CKD-EPI Creatinine Equation (2021)    Anion gap 9 5 - 15    Comment: Performed at [facility], [place] [address] [place], [place] [address]  Magnesium     Status: None   Collection Time: 03/10/20  4:42 AM  Result Value Ref Range   Magnesium 2.3 1.7 - 2.4 mg/dL    Comment: Performed at [facility] [address] [place], [place] [address]  Phosphorus     Status: None   Collection Time: 03/10/20  4:42 AM  Result Value Ref Range   Phosphorus 4.3 2.5 - 4.6 mg/dL    Comment: Performed at [facility] [address], [place], [place] [address]  CBC     Status: Abnormal   Collection Time: 03/10/20  4:42 AM  Result Value Ref Range   WBC 2.5 (L) 4.0 - 10.5 K/uL   RBC 2.64 (L) 3.87 - 5.11 MIL/uL   Hemoglobin 8.3 (L) 12.0 - 15.0 g/dL   HCT 26.3 (L) 36.0 - 46.0 %   MCV 99.6 80.0 - 100.0 fL   MCH 31.4 26.0 - 34.0  pg   MCHC 31.6 30.0 - 36.0 g/dL   RDW 16.9 (H) 11.5 - 15.5 %   Platelets 13 (LL) 150 - 400 K/uL    Comment: Immature Platelet Fraction may be clinically indicated, consider ordering this additional test [id number] CONSISTENT WITH PREVIOUS RESULT CRITICAL VALUE NOTED.  VALUE IS CONSISTENT WITH PREVIOUSLY REPORTED AND CALLED VALUE. REPEATED TO VERIFY    nRBC 0.0 0.0 - 0.2 %    Comment: Performed at [facility], [place] [address] [place], [place] [address]  Glucose, capillary     Status: None   Collection Time: 03/10/20  5:00 AM  Result Value Ref Range   Glucose-Capillary 94 70 - 99 mg/dL    Comment: Glucose reference range applies only to samples taken after fasting for at least 8 hours.  Glucose, capillary     Status: Abnormal   Collection Time: 03/10/20  7:40 AM  Result Value Ref Range   Glucose-Capillary 100 (H) 70 - 99 mg/dL    Comment: Glucose reference range applies only to samples taken after fasting for at least 8 hours.  Glucose, capillary     Status: Abnormal   Collection Time: 03/10/20 11:49 AM  Result Value Ref Range   Glucose-Capillary 146 (H) 70 - 99 mg/dL    Comment: Glucose reference range applies only to samples taken after fasting for at least 8 hours.   Portable chest 1 View  Result Date: 03/09/2020 CLINICAL DATA:  Shortness of breath.  Hazy bibasilar opacities. EXAM: PORTABLE CHEST 1 VIEW COMPARISON:  March 08, 2020 FINDINGS: Cardiomegaly. No pneumothorax. No nodules or masses. No focal infiltrate seen on today's study. IMPRESSION: Cardiomegaly. No focal infiltrate identified. No acute abnormality noted. Electronically Signed   By: Dorise Bullion III M.D   On: 03/09/2020 08:43   DG Chest Portable 1 View  Result Date: 03/08/2020 CLINICAL DATA:  Fatigue and shortness of breath. EXAM: PORTABLE CHEST 1 VIEW COMPARISON:  Single-view of the chest 07/30/2019. PA and lateral chest 05/16/2019. FINDINGS: There is cardiomegaly. Hazy bibasilar airspace disease is worse on  the left. No pulmonary edema, pneumothorax or pleural effusion. No acute or focal bony abnormality. IMPRESSION: Hazy bibasilar airspace disease is worse on the left and could be due to atelectasis  or pneumonia. Cardiomegaly without edema. Electronically Signed   By: Inge Rise M.D.   On: 03/08/2020 13:02      Assessment/Plan T2DM Hx of osteomyelitis s/p R BKA HTN Hx of CVA  Chronic Refractory ITP - heme/onc following as well - plts were 6K on admission now up to 13K s/p 2 doses IVIG - patient at very high risk of bleeding for surgery, ideally would want plts closer to 50K - not sure whether there is any role for IR embolization of spleen  - will await further recs from heme/onc and discuss further with attending MD - no indication for emergent surgical intervention but we will follow closely    Norm Parcel, [facility] Surgery 03/10/2020, 12:06 PM Please see Amion for pager number during day hours 7:00am-4:30pm

## 2020-03-10 NOTE — Progress Notes (Addendum)
HEMATOLOGY-ONCOLOGY PROGRESS NOTE  SUBJECTIVE: The patient has completed 2000 IVIG and tolerated well.  Platelet count up to 13,000 today.  She denies any active bleeding.  She reports that she bruises easily.  She has spoken with general surgery earlier today about the possibility of splenectomy and she is not terribly interested in this idea.  REVIEW OF SYSTEMS:   Constitutional: Denies fevers, chills Eyes: Denies blurriness of vision Ears, nose, mouth, throat, and face: Denies mucositis or sore throat Respiratory: Denies cough, dyspnea or wheezes Cardiovascular: Denies palpitation, chest discomfort Gastrointestinal:  Denies nausea, heartburn or change in bowel habits Skin: Denies petechiae or rashes Lymphatics: Bruises easily, denies new lymphadenopathy Neurological:Denies numbness, tingling or new weaknesses Behavioral/Psych: Mood is stable, no new changes  Extremities: No lower extremity edema All other systems were reviewed with the patient and are negative.  I have reviewed the past medical history, past surgical history, social history and family history with the patient and they are unchanged from previous note.   PHYSICAL EXAMINATION:  Vitals:   03/10/20 0743 03/10/20 1151  BP: (!) 156/81 (!) 157/73  Pulse: 83 83  Resp: 18 18  Temp: 98.1 F (36.7 C) 98.3 F (36.8 C)  SpO2: 94% 92%   Filed Weights   03/08/20 1811 03/09/20 0146 03/10/20 0112  Weight: 108.4 kg 109 kg 110.8 kg    Intake/Output from previous day: 03/06 0701 - 03/07 0700 In: 954 [P.O.:954] Out: 1800 [Urine:1800]  GENERAL:alert, no distress and comfortable SKIN: Multiple ecchymoses over her arms EYES: normal, Conjunctiva are pink and non-injected, sclera clear OROPHARYNX:no exudate, no erythema and lips, buccal mucosa, and tongue normal  LUNGS: clear to auscultation and percussion with normal breathing effort HEART: regular rate & rhythm and no murmurs  ABDOMEN:abdomen soft, non-tender and normal  bowel sounds NEURO: alert & oriented x 3 with fluent speech, no focal motor/sensory deficits  LABORATORY DATA:  I have reviewed the data as listed CMP Latest Ref Rng & Units 03/10/2020 03/09/2020 03/08/2020  Glucose 70 - 99 mg/dL 91 86 619(J)  BUN 6 - 20 mg/dL 09(T) 26(Z) 12(W)  Creatinine 0.44 - 1.00 mg/dL 5.80(D) 9.83(J) 8.25(K)  Sodium 135 - 145 mmol/L 133(L) 134(L) 136  Potassium 3.5 - 5.1 mmol/L 4.9 5.0 6.1(H)  Chloride 98 - 111 mmol/L 107 106 105  CO2 22 - 32 mmol/L 17(L) 19(L) 21(L)  Calcium 8.9 - 10.3 mg/dL 5.3(Z) 8.3(L) 8.5(L)  Total Protein 6.5 - 8.1 g/dL 8.0 7.2 -  Total Bilirubin 0.3 - 1.2 mg/dL 0.6 0.6 -  Alkaline Phos 38 - 126 U/L 55 62 -  AST 15 - 41 U/L 18 17 -  ALT 0 - 44 U/L 10 12 -    Lab Results  Component Value Date   WBC 2.5 (L) 03/10/2020   HGB 8.3 (L) 03/10/2020   HCT 26.3 (L) 03/10/2020   MCV 99.6 03/10/2020   PLT 13 (LL) 03/10/2020   NEUTROABS 4.3 09/19/2019    Portable chest 1 View  Result Date: 03/09/2020 CLINICAL DATA:  Shortness of breath.  Hazy bibasilar opacities. EXAM: PORTABLE CHEST 1 VIEW COMPARISON:  March 08, 2020 FINDINGS: Cardiomegaly. No pneumothorax. No nodules or masses. No focal infiltrate seen on today's study. IMPRESSION: Cardiomegaly. No focal infiltrate identified. No acute abnormality noted. Electronically Signed   By: Gerome Sam III M.D   On: 03/09/2020 08:43   DG Chest Portable 1 View  Result Date: 03/08/2020 CLINICAL DATA:  Fatigue and shortness of breath. EXAM: PORTABLE CHEST 1 VIEW  COMPARISON:  Single-view of the chest 07/30/2019. PA and lateral chest 05/16/2019. FINDINGS: There is cardiomegaly. Hazy bibasilar airspace disease is worse on the left. No pulmonary edema, pneumothorax or pleural effusion. No acute or focal bony abnormality. IMPRESSION: Hazy bibasilar airspace disease is worse on the left and could be due to atelectasis or pneumonia. Cardiomegaly without edema. Electronically Signed   By: Drusilla Kanner M.D.   On:  03/08/2020 13:02    ASSESSMENT AND PLAN: 1.  Relapsed refractory ITP 2.  Acute renal failure with hyperkalemia, improved 3.  Diabetes mellitus 4.  Hypertension 5.  PVD  -The patient has relapsed refractory ITP.  The patient usually responds well to IVIG.  Cannot use steroids due to diabetes.  She also previously received Rituxan but missed appointment that did not complete the planned course. -She is now status post 2 doses of IVIG with slight improvement of her platelets.  I discussed addition of Nplate 1 mcg/kg with the patient.  We discussed the risks/benefits of this medication and she agrees to proceed. -The patient does not actively bleeding.  There is no role for platelet transfusion unless she develops active bleeding. -General surgery has been consulted for consideration of splenectomy.  Due to low platelets, she is currently very high risk of bleeding for surgery and ideally general surgery would like her platelet count to be at least 50,000.  The patient is not very interested in splenectomy.  Will discuss further.   LOS: 1 day   Clenton Pare, DNP, AGPCNP-BC, AOCNP 03/10/20  Attending Note  I personally saw the patient, reviewed the chart and examined the patient. The plan of care was discussed with the patient and the admitting team. I agree with the assessment and plan as documented above.  I performed the bulk of the evaluation and determine the plan of care.  Thank you very much for the consultation.  1.  Relapsed refractory ITP: Received IVIG in the hospital and as outpatient.  After first dose of Nplate, her platelet count has improved this morning to 70. We will see her back next week for lab check and another dose of Nplate if needed. Goal is to keep platelet count greater than 50. She understands that she will need to come weekly for lab checks. Splenectomy was considered but she did not want to go through it at this time and surgery was also reluctant given her  platelet counts were so low.  2. hypokalemia, diabetes, chronic kidney disease I will see her in clinic in 1 week with labs and follow-up.

## 2020-03-10 NOTE — Consult Note (Signed)
WOC Nurse Consult Note: Patient receiving care in [facility] 3E5. Reason for Consult: Left foot wounds; followed by Dr. Leanord Hawking in the outpatient wound center Wound type: healed over areas on left plantar foot. Pressure Injury POA: Yes/No/NA Measurement: Wound bed: Drainage (amount, consistency, odor)  Periwound: Dressing procedure/placement/frequency:  Wash LLE with soap and water. Pat dry. Apply Sween Moisturizing Ointment (pink and white tube in clean utility). DO NOT APPLY BETWEEN TOES OR ON PLANTAR SURFACE OF FOOT. Place a foam dressing over the wounds on the plantar surface of the foot. Beginning behind the toes and going to just below the knees, spiral wrap kerlex, then 4 inch ace wrap. Perform daily.  Monitor the wound area(s) for worsening of condition such as: Signs/symptoms of infection,  Increase in size,  Development of or worsening of odor, Development of pain, or increased pain at the affected locations.  Notify the medical team if any of these develop.  Thank you for the consult.  Discussed plan of care with the patient and bedside nurse.  WOC nurse will not follow at this time.  Please re-consult the WOC team if needed.  Helmut Muster, RN, MSN, CWOCN, CNS-BC, pager [phone]

## 2020-03-10 NOTE — Plan of Care (Signed)
  Problem: Clinical Measurements: Goal: Will remain free from infection Outcome: Progressing   Problem: Clinical Measurements: Goal: Ability to maintain clinical measurements within normal limits will improve Outcome: Progressing   Problem: Clinical Measurements: Goal: Respiratory complications will improve Outcome: Progressing   Problem: Activity: Goal: Risk for activity intolerance will decrease Outcome: Progressing

## 2020-03-10 NOTE — Consult Note (Signed)
Consult Note  [name] May 10, 1965  [id number].    [name] [birth year]  [id number].    Requesting MD: Dr. Cristal Ford Chief Complaint/Reason for Consult: Refractory ITP  HPI:  Patient is a 55 year old female who presented to [facility] 03/08/20 for severe thrombocytopenia noted on home lab draw. Patient has known chronic refractory ITP and platelets were 14K on 2/24 and repeat check showed they had decreased to [place]. She reports she started going to the cancer center for management of ITP about 2 years ago. Patient reports occasional nosebleeds although recently she has not had any major bleeding episodes. She does report increased bruising recently. She denies hemoptysis, GI bleeding or bleeding from gums. She denies abdominal pain. She has been given 2 doses of IVIG while admitted and platelets are 13 K from 6K. No platelet infusions. General surgery asked to see for possible splenectomy.  PMH otherwise significant for T2DM, Hx of osteomyelitis s/p R BKA, HTN, Hx of CVA. Past abdominal surgery includes cesarean section x2 with tubal ligation. NKDA. She reports occasional glass of wine, denies tobacco or illicit drug use. She does not work.   ROS: Review of Systems  Respiratory: Negative for shortness of breath and wheezing.   Cardiovascular: Negative for chest pain and palpitations.  Gastrointestinal: Negative for abdominal pain, nausea and vomiting.  Genitourinary: Negative for dysuria, frequency and urgency.  Endo/Heme/Allergies: Bruises/bleeds easily.  All other systems reviewed and are negative.  Family History  Problem Relation Age of Onset  . Diabetes Mother   . Hypertension Mother   . Hyperlipidemia Mother   . Diabetes Father   . Cancer Father        prostate  . Hyperlipidemia Father   . Hypertension Father   . Diabetes Brother   . Peripheral Artery Disease Brother        has had toes amputated  . Diabetes Son     Past Medical History:   Diagnosis Date  . Acute exacerbation of CHF (congestive heart failure) ([place]) 04/11/2019  . Anemia   . Arthritis    "spine, hands" (11/25/2015)  . Back pain    "all my back; probably 3 times/week" (11/25/2015)  . Chronic indwelling Foley catheter 03/04/2017  . Diabetic foot ulcer associated with type 2 diabetes mellitus ([place])   . Hypertension   . Intracerebral hemorrhage ([place]) As a teenager    States she had burst blood vessel as teenager, now with resultant minor visual field and hearing deficits  . Neuropathy   . Stroke [facility]) ~ 1982   "my feeling on my RLE, right lower eye vision,  & hearing out of my right ear not the same since" (11/25/2015)  . Type II diabetes mellitus ([place])     Past Surgical History:  Procedure Laterality Date  . AMPUTATION  11/24/2010   Procedure: AMPUTATION FOOT;  Surgeon: Wylene Simmer, MD;  Location: [place];  Service: Orthopedics;  Laterality: Right;  Right  FOOT TRANS METATARSAL AMPUTATION  . AMPUTATION  07/02/2011   Procedure: AMPUTATION BELOW KNEE;  Surgeon: Wylene Simmer, MD;  Location: [place];  Service: Orthopedics;  Laterality: Right;  . APPLICATION OF WOUND VAC  07/02/2011   Procedure: APPLICATION OF WOUND VAC;  Surgeon: Wylene Simmer, MD;  Location: [place];  Service: Orthopedics;  Laterality: Right;  . BRAIN SURGERY  1980's   Previous brain surgery in [place], [place]  . CESAREAN SECTION  2004; 2006  . COLONOSCOPY N/A 09/17/2015  Procedure: COLONOSCOPY;  Surgeon: Wilford Corner, MD;  Location[facility] ENDOSCOPY;  Service: Endoscopy;  Laterality: N/A;  . ESOPHAGOGASTRODUODENOSCOPY N/A 09/17/2015   Procedure: ESOPHAGOGASTRODUODENOSCOPY (EGD);  Surgeon: Wilford Corner, MD;  Location: [facility] ENDOSCOPY;  Service: Endoscopy;  Laterality: N/A;  . I & D EXTREMITY  11/24/2010   Procedure: IRRIGATION AND DEBRIDEMENT EXTREMITY;  Surgeon: Wylene Simmer, MD;  Location: [place];  Service: Orthopedics;  Laterality: Left;  Debriedment of left plantar ulcer and trimming of toenails  . I &  D EXTREMITY  07/02/2011   Procedure: IRRIGATION AND DEBRIDEMENT EXTREMITY;  Surgeon: Wylene Simmer, MD;  Location: [place];  Service: Orthopedics;  Laterality: Left;  I & D of Left Foot  . I & D EXTREMITY  07/06/2011   Procedure: IRRIGATION AND DEBRIDEMENT EXTREMITY;  Surgeon: Wylene Simmer, MD;  Location: [place];  Service: Orthopedics;  Laterality: Right;  IRRIGATION/DEBRIDEMENT RIGHT BELOW KNEE AMPUTATION  . TUBAL LIGATION  2006    Social History:  reports that she has never smoked. She has never used smokeless tobacco. She reports current alcohol use of about 2.0 standard drinks of alcohol per week. She reports that she does not use drugs.  Allergies: No Known Allergies  Medications Prior to Admission  Medication Sig Dispense Refill  . acetaminophen (TYLENOL) 500 MG tablet Take 1,000 mg by mouth as needed (pain).    Marland Kitchen amLODipine (NORVASC) 5 MG tablet Take 1 tablet (5 mg total) by mouth daily. 90 tablet 3  . aspirin EC 81 MG tablet Take 81 mg by mouth daily as needed for moderate pain. Swallow whole.    . Blood Glucose Monitoring Suppl (PRODIGY AUTOCODE BLOOD GLUCOSE) w/Device KIT 1 kit by Does not apply route in the morning, at noon, in the evening, and at bedtime. 1 kit 0  . gabapentin (NEURONTIN) 400 MG capsule Take 1 capsule (400 mg total) by mouth 2 (two) times daily. 180 capsule 3  . isosorbide mononitrate (IMDUR) 30 MG 24 hr tablet Take 1 tablet (30 mg total) by mouth daily. 90 tablet 3  . metFORMIN (GLUCOPHAGE) 500 MG tablet Take 1 tablet (500 mg total) by mouth 2 (two) times daily with a meal. 180 tablet 3  . metoprolol succinate (TOPROL-XL) 50 MG 24 hr tablet Take 1 tablet (50 mg total) by mouth daily. Take with or immediately following a meal. 90 tablet 3  . rosuvastatin (CRESTOR) 20 MG tablet Take 1 tablet (20 mg total) by mouth daily. 90 tablet 3    Blood pressure (!) 157/73, pulse 83, temperature 98.3 F (36.8 C), temperature source Oral, resp. rate 18, height _0  (1.676 m), weight  110.8 kg, SpO2 92 %. Physical Exam:  General: pleasant, WD, obese female who is laying in bed in NAD HEENT: head is normocephalic, atraumatic.  Sclera are noninjected.  PERRL.  Ears and nose without any masses or lesions.  Mouth is pink and moist Heart: regular, rate, and rhythm.  Normal s1,s2. No obvious murmurs, gallops, or rubs noted.  Palpable radial and pedal pulses bilaterally Lungs: CTAB, no wheezes, rhonchi, or rales noted.  Respiratory effort nonlabored Abd: soft, NT, ND, +BS, no masses, hernias, or organomegaly MS: R BKA, dressing to LLE for diabetic foot wound Skin: warm and dry, various bruises scattered over extremities Neuro: Cranial nerves 2-12 grossly intact, sensation is normal throughout Psych: A&Ox3 with an appropriate affect.   Results for orders placed or performed during the hospital encounter of 03/08/20 (from the past 48 hour(s))  Troponin I (High  Sensitivity)     Status: None   Collection Time: 03/08/20  1:05 PM  Result Value Ref Range   Troponin I (High Sensitivity) 13 <18 ng/L    Comment: (NOTE) Elevated high sensitivity troponin I (hsTnI) values and significant  changes across serial measurements may suggest ACS but many other  chronic and acute conditions are known to elevate hsTnI results.  Refer to the "Links" section for chest pain algorithms and additional  guidance. Performed at [facility], [place] [address], [place], [place] [address]   Brain natriuretic peptide     Status: Abnormal   Collection Time: 03/08/20  1:09 PM  Result Value Ref Range   B Natriuretic Peptide 824.4 (H) 0.0 - 100.0 pg/mL    Comment: Performed at [facility] [address], [place], [place] [address]  Type and screen [place]     Status: None   Collection Time: 03/08/20  2:26 PM  Result Value Ref Range   ABO/RH(D) B POS    Antibody Screen NEG    Sample Expiration      03/11/2020,2359 Performed at [facility], [place] [address]  [place], [place] [address]   Troponin I (High Sensitivity)     Status: None   Collection Time: 03/08/20  5:51 PM  Result Value Ref Range   Troponin I (High Sensitivity) 14 <18 ng/L    Comment: (NOTE) Elevated high sensitivity troponin I (hsTnI) values and significant  changes across serial measurements may suggest ACS but many other  chronic and acute conditions are known to elevate hsTnI results.  Refer to the "Links" section for chest pain algorithms and additional  guidance. Performed at [facility], [place] [address] [place], [place] [address]   HIV Antibody (routine testing w rflx)     Status: None   Collection Time: 03/08/20  5:51 PM  Result Value Ref Range   HIV Screen 4th Generation wRfx Non Reactive Non Reactive    Comment: Performed at [facility], [place] [address] [place], [place] [address]  Platelet count     Status: Abnormal   Collection Time: 03/08/20  5:51 PM  Result Value Ref Range   Platelets 6 (LL) 150 - 400 K/uL    Comment: CRITICAL VALUE NOTED.  VALUE IS CONSISTENT WITH PREVIOUSLY REPORTED AND CALLED VALUE. REPEATED TO VERIFY Performed at [facility], [place] [address] [place], [place] [address]   Lipid panel     Status: None   Collection Time: 03/08/20  5:51 PM  Result Value Ref Range   Cholesterol 104 0 - 200 mg/dL   Triglycerides 51 <150 mg/dL   HDL 66 >40 mg/dL   Total CHOL/HDL Ratio 1.6 RATIO   VLDL 10 0 - 40 mg/dL   LDL Cholesterol 28 0 - 99 mg/dL    Comment:        Total Cholesterol/HDL:CHD Risk Coronary Heart Disease Risk Table                     Men   Women  1/2 Average Risk   3.4   3.3  Average Risk       5.0   4.4  2 X Average Risk   9.6   7.1  3 X Average Risk  23.4   11.0        Use the calculated Patient Ratio above and the CHD Risk Table to determine the patient's CHD  Risk.        ATP III CLASSIFICATION (LDL):  <100     mg/dL   Optimal  100-129  mg/dL   Near or Above                    Optimal  130-159  mg/dL    Borderline  160-189  mg/dL   High  >190     mg/dL   Very High Performed at [facility] [address] [place], [place] [address]   Glucose, capillary     Status: None   Collection Time: 03/08/20  6:04 PM  Result Value Ref Range   Glucose-Capillary 76 70 - 99 mg/dL    Comment: Glucose reference range applies only to samples taken after fasting for at least 8 hours.  Resp Panel by RT-PCR (Flu A&B, Covid) Nasopharyngeal Swab     Status: None   Collection Time: 03/08/20  6:13 PM   Specimen: Nasopharyngeal Swab; Nasopharyngeal(NP) swabs in vial transport medium  Result Value Ref Range   SARS Coronavirus 2 by RT PCR NEGATIVE NEGATIVE    Comment: (NOTE) SARS-CoV-2 target nucleic acids are NOT DETECTED.  The SARS-CoV-2 RNA is generally detectable in upper respiratory specimens during the acute phase of infection. The lowest concentration of SARS-CoV-2 viral copies this assay can detect is 138 copies/mL. A negative result does not preclude SARS-Cov-2 infection and should not be used as the sole basis for treatment or other patient management decisions. A negative result may occur with  improper specimen collection/handling, submission of specimen other than nasopharyngeal swab, presence of viral mutation(s) within the areas targeted by this assay, and inadequate number of viral copies(<138 copies/mL). A negative result must be combined with clinical observations, patient history, and epidemiological information. The expected result is Negative.  Fact Sheet for Patients:  EntrepreneurPulse.com.au  Fact Sheet for Healthcare Providers:  IncredibleEmployment.be  This test is no t yet approved or cleared by the [place] FDA and  has been authorized for detection and/or diagnosis of SARS-CoV-2 by FDA under an Emergency Use Authorization (EUA). This EUA will remain  in effect (meaning this test can be used) for the duration of the COVID-19  declaration under Section 564(b)(1) of the Act, 21 U.S.C.section 360bbb-3(b)(1), unless the authorization is terminated  or revoked sooner.       Influenza A by PCR NEGATIVE NEGATIVE   Influenza B by PCR NEGATIVE NEGATIVE    Comment: (NOTE) The Xpert Xpress SARS-CoV-2/FLU/RSV plus assay is intended as an aid in the diagnosis of influenza from Nasopharyngeal swab specimens and should not be used as a sole basis for treatment. Nasal washings and aspirates are unacceptable for Xpert Xpress SARS-CoV-2/FLU/RSV testing.  Fact Sheet for Patients: EntrepreneurPulse.com.au  Fact Sheet for Healthcare Providers: IncredibleEmployment.be  This test is not yet approved or cleared by the [place] FDA and has been authorized for detection and/or diagnosis of SARS-CoV-2 by FDA under an Emergency Use Authorization (EUA). This EUA will remain in effect (meaning this test can be used) for the duration of the COVID-19 declaration under Section 564(b)(1) of the Act, 21 U.S.C. section 360bbb-3(b)(1), unless the authorization is terminated or revoked.  Performed at [facility], [place] [address] [place], [place] [address]   Glucose, capillary     Status: Abnormal   Collection Time: 03/08/20  9:17 PM  Result Value Ref Range   Glucose-Capillary 121 (H) 70 - 99 mg/dL    Comment: Glucose reference range  applies only to samples taken after fasting for at least 8 hours.   Comment 1 Notify RN    Comment 2 Document in Chart   Glucose, capillary     Status: None   Collection Time: 03/08/20 11:58 PM  Result Value Ref Range   Glucose-Capillary 85 70 - 99 mg/dL    Comment: Glucose reference range applies only to samples taken after fasting for at least 8 hours.  Glucose, capillary     Status: None   Collection Time: 03/09/20  4:01 AM  Result Value Ref Range   Glucose-Capillary 88 70 - 99 mg/dL    Comment: Glucose reference range applies only to samples taken  after fasting for at least 8 hours.  Comprehensive metabolic panel     Status: Abnormal   Collection Time: 03/09/20  5:05 AM  Result Value Ref Range   Sodium 134 (L) 135 - 145 mmol/L   Potassium 5.0 3.5 - 5.1 mmol/L   Chloride 106 98 - 111 mmol/L   CO2 19 (L) 22 - 32 mmol/L   Glucose, Bld 86 70 - 99 mg/dL    Comment: Glucose reference range applies only to samples taken after fasting for at least 8 hours.   BUN 45 (H) 6 - 20 mg/dL   Creatinine, Ser 1.44 (H) 0.44 - 1.00 mg/dL   Calcium 8.3 (L) 8.9 - 10.3 mg/dL   Total Protein 7.2 6.5 - 8.1 g/dL   Albumin 3.0 (L) 3.5 - 5.0 g/dL   AST 17 15 - 41 U/L   ALT 12 0 - 44 U/L   Alkaline Phosphatase 62 38 - 126 U/L   Total Bilirubin 0.6 0.3 - 1.2 mg/dL   GFR, Estimated 43 (L) >60 mL/min    Comment: (NOTE) Calculated using the CKD-EPI Creatinine Equation (2021)    Anion gap 9 5 - 15    Comment: Performed at [facility], [place] [address] [place], [place] [address]  Magnesium     Status: None   Collection Time: 03/09/20  5:05 AM  Result Value Ref Range   Magnesium 2.3 1.7 - 2.4 mg/dL    Comment: Performed at [facility] [address], [place], [place] [address]  Phosphorus     Status: Abnormal   Collection Time: 03/09/20  5:05 AM  Result Value Ref Range   Phosphorus 5.2 (H) 2.5 - 4.6 mg/dL    Comment: Performed at [facility] [address] [place], [place] [address]  CBC     Status: Abnormal   Collection Time: 03/09/20  5:05 AM  Result Value Ref Range   WBC 2.6 (L) 4.0 - 10.5 K/uL   RBC 2.70 (L) 3.87 - 5.11 MIL/uL   Hemoglobin 8.2 (L) 12.0 - 15.0 g/dL   HCT 27.7 (L) 36.0 - 46.0 %   MCV 102.6 (H) 80.0 - 100.0 fL   MCH 30.4 26.0 - 34.0 pg   MCHC 29.6 (L) 30.0 - 36.0 g/dL   RDW 17.1 (H) 11.5 - 15.5 %   Platelets 10 (LL) 150 - 400 K/uL    Comment: Immature Platelet Fraction may be clinically indicated, consider ordering this additional test [id number] CRITICAL VALUE NOTED.  VALUE IS CONSISTENT WITH PREVIOUSLY  REPORTED AND CALLED VALUE. REPEATED TO VERIFY    nRBC 0.0 0.0 - 0.2 %    Comment: Performed at [facility], [place] [address], [place], [place] [address]  Hemoglobin A1c     Status: None  Collection Time: 03/09/20  5:05 AM  Result Value Ref Range   Hgb A1c MFr Bld 5.1 4.8 - 5.6 %    Comment: (NOTE) Pre diabetes:          5.7%-6.4%  Diabetes:              >6.4%  Glycemic control for   <7.0% adults with diabetes    Mean Plasma Glucose 99.67 mg/dL    Comment: Performed at [facility] [address], [place], [place] [address]  Glucose, capillary     Status: Abnormal   Collection Time: 03/09/20 11:43 AM  Result Value Ref Range   Glucose-Capillary 111 (H) 70 - 99 mg/dL    Comment: Glucose reference range applies only to samples taken after fasting for at least 8 hours.  Urinalysis, Routine w reflex microscopic Urine, Clean Catch     Status: Abnormal   Collection Time: 03/09/20  4:24 PM  Result Value Ref Range   Color, Urine YELLOW YELLOW   APPearance HAZY (A) CLEAR   Specific Gravity, Urine 1.010 1.005 - 1.030   pH 7.0 5.0 - 8.0   Glucose, UA NEGATIVE NEGATIVE mg/dL   Hgb urine dipstick SMALL (A) NEGATIVE   Bilirubin Urine NEGATIVE NEGATIVE   Ketones, ur NEGATIVE NEGATIVE mg/dL   Protein, ur 30 (A) NEGATIVE mg/dL   Nitrite NEGATIVE NEGATIVE   Leukocytes,Ua LARGE (A) NEGATIVE   RBC / HPF 0-5 0 - 5 RBC/hpf   WBC, UA 21-50 0 - 5 WBC/hpf   Bacteria, UA NONE SEEN NONE SEEN   Squamous Epithelial / LPF 0-5 0 - 5    Comment: Performed at [facility], [address], [place], [place] [address]  Glucose, capillary     Status: Abnormal   Collection Time: 03/09/20  4:30 PM  Result Value Ref Range   Glucose-Capillary 131 (H) 70 - 99 mg/dL    Comment: Glucose reference range applies only to samples taken after fasting for at least 8 hours.  Glucose, capillary     Status: Abnormal   Collection Time: 03/09/20  8:04 PM  Result Value Ref Range   Glucose-Capillary 145  (H) 70 - 99 mg/dL    Comment: Glucose reference range applies only to samples taken after fasting for at least 8 hours.  Glucose, capillary     Status: Abnormal   Collection Time: 03/09/20 11:10 PM  Result Value Ref Range   Glucose-Capillary 115 (H) 70 - 99 mg/dL    Comment: Glucose reference range applies only to samples taken after fasting for at least 8 hours.  Comprehensive metabolic panel     Status: Abnormal   Collection Time: 03/10/20  4:42 AM  Result Value Ref Range   Sodium 133 (L) 135 - 145 mmol/L   Potassium 4.9 3.5 - 5.1 mmol/L   Chloride 107 98 - 111 mmol/L   CO2 17 (L) 22 - 32 mmol/L   Glucose, Bld 91 70 - 99 mg/dL    Comment: Glucose reference range applies only to samples taken after fasting for at least 8 hours.   BUN 48 (H) 6 - 20 mg/dL   Creatinine, Ser 1.33 (H) 0.44 - 1.00 mg/dL   Calcium 8.5 (L) 8.9 - 10.3 mg/dL   Total Protein 8.0 6.5 - 8.1 g/dL   Albumin 2.9 (L) 3.5 - 5.0 g/dL   AST 18 15 - 41 U/L   ALT 10 0 - 44 U/L   Alkaline Phosphatase 55 38 -  126 U/L   Total Bilirubin 0.6 0.3 - 1.2 mg/dL   GFR, Estimated 48 (L) >60 mL/min    Comment: (NOTE) Calculated using the CKD-EPI Creatinine Equation (2021)    Anion gap 9 5 - 15    Comment: Performed at [facility] [address] [place], [place] [address]  Magnesium     Status: None   Collection Time: 03/10/20  4:42 AM  Result Value Ref Range   Magnesium 2.3 1.7 - 2.4 mg/dL    Comment: Performed at [facility] [address], [place], [place] [address]  Phosphorus     Status: None   Collection Time: 03/10/20  4:42 AM  Result Value Ref Range   Phosphorus 4.3 2.5 - 4.6 mg/dL    Comment: Performed at [facility] [address] [place], [place] [address]  CBC     Status: Abnormal   Collection Time: 03/10/20  4:42 AM  Result Value Ref Range   WBC 2.5 (L) 4.0 - 10.5 K/uL   RBC 2.64 (L) 3.87 - 5.11 MIL/uL   Hemoglobin 8.3 (L) 12.0 - 15.0 g/dL   HCT 26.3 (L) 36.0 - 46.0 %   MCV 99.6  80.0 - 100.0 fL   MCH 31.4 26.0 - 34.0 pg   MCHC 31.6 30.0 - 36.0 g/dL   RDW 16.9 (H) 11.5 - 15.5 %   Platelets 13 (LL) 150 - 400 K/uL    Comment: Immature Platelet Fraction may be clinically indicated, consider ordering this additional test [id number] CONSISTENT WITH PREVIOUS RESULT CRITICAL VALUE NOTED.  VALUE IS CONSISTENT WITH PREVIOUSLY REPORTED AND CALLED VALUE. REPEATED TO VERIFY    nRBC 0.0 0.0 - 0.2 %    Comment: Performed at [facility], [place] [address], [place], [place] [address]  Glucose, capillary     Status: None   Collection Time: 03/10/20  5:00 AM  Result Value Ref Range   Glucose-Capillary 94 70 - 99 mg/dL    Comment: Glucose reference range applies only to samples taken after fasting for at least 8 hours.  Glucose, capillary     Status: Abnormal   Collection Time: 03/10/20  7:40 AM  Result Value Ref Range   Glucose-Capillary 100 (H) 70 - 99 mg/dL    Comment: Glucose reference range applies only to samples taken after fasting for at least 8 hours.  Glucose, capillary     Status: Abnormal   Collection Time: 03/10/20 11:49 AM  Result Value Ref Range   Glucose-Capillary 146 (H) 70 - 99 mg/dL    Comment: Glucose reference range applies only to samples taken after fasting for at least 8 hours.   Portable chest 1 View  Result Date: 03/09/2020 CLINICAL DATA:  Shortness of breath.  Hazy bibasilar opacities. EXAM: PORTABLE CHEST 1 VIEW COMPARISON:  March 08, 2020 FINDINGS: Cardiomegaly. No pneumothorax. No nodules or masses. No focal infiltrate seen on today's study. IMPRESSION: Cardiomegaly. No focal infiltrate identified. No acute abnormality noted. Electronically Signed   By: Dorise Bullion III M.D   On: 03/09/2020 08:43   DG Chest Portable 1 View  Result Date: 03/08/2020 CLINICAL DATA:  Fatigue and shortness of breath. EXAM: PORTABLE CHEST 1 VIEW COMPARISON:  Single-view of the chest 07/30/2019. PA and lateral chest 05/16/2019. FINDINGS: There is cardiomegaly. Hazy  bibasilar airspace disease is worse on the left. No pulmonary edema, pneumothorax or pleural effusion. No acute or focal bony abnormality. IMPRESSION: Hazy bibasilar airspace disease is worse  on the left and could be due to atelectasis or pneumonia. Cardiomegaly without edema. Electronically Signed   By: Inge Rise M.D.   On: 03/08/2020 13:02      Assessment/Plan T2DM Hx of osteomyelitis s/p R BKA HTN Hx of CVA  Chronic Refractory ITP - heme/onc following as well - plts were 6K on admission now up to 13K s/p 2 doses IVIG - patient at very high risk of bleeding for surgery, ideally would want plts closer to 50K - not sure whether there is any role for IR embolization of spleen  - will await further recs from heme/onc and discuss further with attending MD - no indication for emergent surgical intervention but we will follow closely   Norm Parcel, [facility] Surgery 03/10/2020, 12:21 PM Please see Amion for pager number during day hours 7:00am-4:30pm

## 2020-03-11 ENCOUNTER — Telehealth: Payer: Self-pay | Admitting: Hematology and Oncology

## 2020-03-11 ENCOUNTER — Other Ambulatory Visit: Payer: Self-pay | Admitting: Hematology and Oncology

## 2020-03-11 DIAGNOSIS — E1121 Type 2 diabetes mellitus with diabetic nephropathy: Secondary | ICD-10-CM

## 2020-03-11 LAB — PHOSPHORUS: Phosphorus: 4.1 mg/dL (ref 2.5–4.6)

## 2020-03-11 LAB — CBC
HCT: 27.1 % — ABNORMAL LOW (ref 36.0–46.0)
Hemoglobin: 8.4 g/dL — ABNORMAL LOW (ref 12.0–15.0)
MCH: 31.1 pg (ref 26.0–34.0)
MCHC: 31 g/dL (ref 30.0–36.0)
MCV: 100.4 fL — ABNORMAL HIGH (ref 80.0–100.0)
Platelets: 73 10*3/uL — ABNORMAL LOW (ref 150–400)
RBC: 2.7 MIL/uL — ABNORMAL LOW (ref 3.87–5.11)
RDW: 17 % — ABNORMAL HIGH (ref 11.5–15.5)
WBC: 2.1 10*3/uL — ABNORMAL LOW (ref 4.0–10.5)
nRBC: 0 % (ref 0.0–0.2)

## 2020-03-11 LAB — COMPREHENSIVE METABOLIC PANEL
ALT: 9 U/L (ref 0–44)
AST: 18 U/L (ref 15–41)
Albumin: 2.7 g/dL — ABNORMAL LOW (ref 3.5–5.0)
Alkaline Phosphatase: 56 U/L (ref 38–126)
Anion gap: 7 (ref 5–15)
BUN: 44 mg/dL — ABNORMAL HIGH (ref 6–20)
CO2: 22 mmol/L (ref 22–32)
Calcium: 8.7 mg/dL — ABNORMAL LOW (ref 8.9–10.3)
Chloride: 107 mmol/L (ref 98–111)
Creatinine, Ser: 1.23 mg/dL — ABNORMAL HIGH (ref 0.44–1.00)
GFR, Estimated: 53 mL/min — ABNORMAL LOW (ref >60)
Glucose, Bld: 111 mg/dL — ABNORMAL HIGH (ref 70–99)
Potassium: 4.6 mmol/L (ref 3.5–5.1)
Sodium: 136 mmol/L (ref 135–145)
Total Bilirubin: 0.6 mg/dL (ref 0.3–1.2)
Total Protein: 7.3 g/dL (ref 6.5–8.1)

## 2020-03-11 LAB — GLUCOSE, CAPILLARY
Glucose-Capillary: 103 mg/dL — ABNORMAL HIGH (ref 70–99)
Glucose-Capillary: 114 mg/dL — ABNORMAL HIGH (ref 70–99)
Glucose-Capillary: 97 mg/dL (ref 70–99)
Glucose-Capillary: 99 mg/dL (ref 70–99)

## 2020-03-11 LAB — MAGNESIUM: Magnesium: 2.3 mg/dL (ref 1.7–2.4)

## 2020-03-11 MED ORDER — COVID-19 MRNA VAC-TRIS(PFIZER) 30 MCG/0.3ML IM SUSP
0.3000 mL | Freq: Once | INTRAMUSCULAR | Status: AC
  Administered 2020-03-11: 0.3 mL via INTRAMUSCULAR
  Filled 2020-03-11: qty 0.3

## 2020-03-11 NOTE — Progress Notes (Signed)
Subjective: CC: Received Nplate yesterday and plt up to 73,000 this am. Pateint denies any abdominal pain, nausea or vomiting.  She is tolerating a diet  Objective: Vital signs in last 24 hours: Temp:  [98.3 F (36.8 C)-98.9 F (37.2 C)] 98.4 F (36.9 C) (03/08 0736) Pulse Rate:  [79-85] 83 (03/08 0736) Resp:  [18-20] 20 (03/08 0736) BP: (137-165)/(63-77) 165/77 (03/08 0736) SpO2:  [89 %-96 %] 92 % (03/08 0736) Weight:  [112.7 kg] 112.7 kg (03/08 0004) Last BM Date: 03/07/20  Intake/Output from previous day: 03/07 0701 - 03/08 0700 In: 480 [P.O.:480] Out: 4450 [Urine:4450] Intake/Output this shift: No intake/output data recorded.  PE: Gen:  Alert, NAD, pleasant Card:  RRR Pulm:  Normal rate and effort  Abd: Soft, NT/ND, +BS Psych: A&Ox3   Lab Results:  Recent Labs    03/10/20 0442 03/11/20 0348  WBC 2.5* 2.1*  HGB 8.3* 8.4*  HCT 26.3* 27.1*  PLT 13* 73*   BMET Recent Labs    03/10/20 0442 03/11/20 0348  NA 133* 136  K 4.9 4.6  CL 107 107  CO2 17* 22  GLUCOSE 91 111*  BUN 48* 44*  CREATININE 1.33* 1.23*  CALCIUM 8.5* 8.7*   PT/INR No results for input(s): LABPROT, INR in the last 72 hours. CMP     Component Value Date/Time   NA 136 03/11/2020 0348   NA 139 05/16/2019 1522   NA 138 09/30/2015 1118   K 4.6 03/11/2020 0348   K 4.6 09/30/2015 1118   CL 107 03/11/2020 0348   CO2 22 03/11/2020 0348   CO2 19 (L) 09/30/2015 1118   GLUCOSE 111 (H) 03/11/2020 0348   GLUCOSE 200 (H) 09/30/2015 1118   BUN 44 (H) 03/11/2020 0348   BUN 23 05/16/2019 1522   BUN 22.3 09/30/2015 1118   CREATININE 1.23 (H) 03/11/2020 0348   CREATININE 1.22 (H) 09/19/2019 1449   CREATININE 1.3 (H) 09/30/2015 1118   CALCIUM 8.7 (L) 03/11/2020 0348   CALCIUM 9.6 09/30/2015 1118   PROT 7.3 03/11/2020 0348   PROT 8.2 05/16/2019 1522   ALBUMIN 2.7 (L) 03/11/2020 0348   ALBUMIN 4.2 05/16/2019 1522   AST 18 03/11/2020 0348   AST 19 09/19/2019 1449   ALT 9 03/11/2020  0348   ALT 17 09/19/2019 1449   ALKPHOS 56 03/11/2020 0348   BILITOT 0.6 03/11/2020 0348   BILITOT 0.3 09/19/2019 1449   GFRNONAA 53 (L) 03/11/2020 0348   GFRNONAA 51 (L) 09/19/2019 1449   GFRAA 59 (L) 09/19/2019 1449   Lipase     Component Value Date/Time   LIPASE 23 06/26/2019 0124       Studies/Results: No results found.  Anti-infectives: Anti-infectives (From admission, onward)   None       Assessment/Plan T2DM Hx of osteomyelitis s/p R BKA HTN Hx of CVA  Chronic Refractory ITP  - heme/onc following as well - pltswere 6Kon admission --> up to 13Ks/p 2 doses IVIG  --> up to 73,000 now after Nplate - patient would like to hold off on surgery during admission if possible.  - If patient required surgery, ideally would want pltscloser to 50K. Will have her follow up as outpatient to discuss possible elective splenectomy  - please call us back if we can be of assistance   LOS: 2 days    Jacinto Halim , [facility] 03/11/2020, 8:22 AM Please see Amion for pager number during day hours 7:00am-4:30pm

## 2020-03-11 NOTE — Plan of Care (Signed)
  Problem: Clinical Measurements: Goal: Ability to maintain clinical measurements within normal limits will improve Outcome: Adequate for Discharge Goal: Will remain free from infection Outcome: Adequate for Discharge Goal: Diagnostic test results will improve Outcome: Adequate for Discharge Goal: Respiratory complications will improve Outcome: Adequate for Discharge Goal: Cardiovascular complication will be avoided Outcome: Adequate for Discharge   

## 2020-03-11 NOTE — Progress Notes (Signed)
D/C instructions given and reviewed. No questions asked but encouraged to call with any concerns. Tele and IV removed, tolerated well. Awaiting family to transport home. 

## 2020-03-11 NOTE — Discharge Summary (Signed)
Physician Discharge Summary  LAURALEI CLOUSE WJX:[id number] DOB: December 19, 1965 DOA: 03/08/2020  PCP: Patient, No Pcp Per  Admit date: 03/08/2020 Discharge date: 03/11/2020  Time spent: 45 minutes  Recommendations for Outpatient Follow-up:  Patient will be discharged to home.  Patient will need to follow up with primary care provider within one week of discharge, repeat CBC and BMP. Follow up with oncology, Dr. Lindi Adie on 03/17/20.  Patient should continue medications as prescribed.  Patient should follow a heart healthy/carb modified diet.   Discharge Diagnoses:  Relapsed refractory ITP Acute hypoxic respiratory failure Acute kidney injury Hyperkalemia Diabetes mellitus, type II Essential hypertension PVD Left foot wounds   Discharge Condition: Stable  Diet recommendation: heart healthy/carb modified  Filed Weights   03/09/20 0146 03/10/20 0112 03/11/20 0004  Weight: 109 kg 110.8 kg 112.7 kg    History of present illness:  On 03/08/2020 by Dr. Dia Crawford [name] a 55 y.o.BF PMHxrefractory ITP, anemia, DM type II uncontrolled with complication, s/p RIGHT BKA, essential HTN, intracerebral hemorrhage,  Presents todaychronic Refractory ITP with prior multiple treatments including dexamethasone, rituximab x4 in May 2021 and missed few infusion appointments and the previous IVIG infusions. Her last IVIG was given for a total of 95 g given on February 7 and February 12, 2020 after platelet count at that time of 14. Her platelet count on February 24 was 14 and home agency draw her laboratory testing again with a platelet count of 7 and based on these findings she was referred to the emergency department.  Hospital Course:  Relapsed refractory ITP -Platelets on admission were 7 however then dropped to 6 -Oncology consulted and appreciated -Despite receiving 2 treatments of IVIG, platelets have minimally improved -Discussed with Dr. Lindi Adie, recommended possibly Nplate versus  splenectomy. -General surgery consulted and appreciated for possible splenectomy- platelets would need to >50. Patient would like to hold off on surgery -Patient given Nplate. Platelets 73 today -Follow up with oncology 03/17/2020 -Repeat CBC  Acute hypoxic respiratory failure -Patient noted to have SPO2 in the 80s when sleeping -Question if she has undiagnosed sleep apnea -Patient will need outpatient sleep study -Continue oxygen to maintain saturations above 88%  Acute kidney injury -Creatinine on admission was 1.92 (baseline approximately 1-1.2) -Creatinine down to 1.23 today -Repeat BMP in one week  Hyperkalemia -Resolved, Potassium 6.1 on admission, down to 4.6  Diabetes mellitus, type II -Hemoglobin A1c 5.1 -Metformin initially held, but may restart on discharge -Continue insulin sliding scale and CBG monitoring  Essential hypertension -BP currently stable, continue amlodipine, Imdur, Toprol  PVD -Stable  Left foot wounds  -present on admission -Wound care consulted and appreciated -Patient states that she also follows up with the outpatient wound center  Consultants Oncology hematology General surgery  Procedures  None  Discharge Exam: Vitals:   03/11/20 0736 03/11/20 0847  BP: (!) 165/77 (!) 163/73  Pulse: 83   Resp: 20   Temp: 98.4 F (36.9 C)   SpO2: 92%     Exam  General: Well developed, chronically ill-appearing, NAD  HEENT: NCAT, mucous membranes moist.   Cardiovascular: S1 S2 auscultated, RRR  Respiratory: Clear to auscultation bilaterally   Abdomen: Soft, nontender, nondistended, + bowel sounds  Extremities: warm dry without cyanosis clubbing or edema of LLE.  Right BKA  Neuro: AAOx3, nonfocal  Psych: Appropriate mood and affect, pleasant   Discharge Instructions Discharge Instructions    Discharge instructions   Complete by: As directed    Patient will be  discharged to home.  Patient will need to follow up with  primary care provider within one week of discharge, repeat CBC and BMP. Follow up with oncology, Dr. Lindi Adie on 03/17/20.  Patient should continue medications as prescribed.  Patient should follow a heart healthy/carb modified diet.   Discharge wound care:   Complete by: As directed    Wash LLE with soap and water. Pat dry. Apply Sween Moisturizing Ointment (pink and white tube in clean utility). DO NOT APPLY BETWEEN TOES OR ON PLANTAR SURFACE OF FOOT. Place a foam dressing over the wounds on the plantar surface of the foot. Beginning behind the toes and going to just below the knees, spiral wrap kerlex, then 4 inch ace wrap. Perform daily.   Increase activity slowly   Complete by: As directed      Allergies as of 03/11/2020   No Known Allergies     Medication List    TAKE these medications   acetaminophen 500 MG tablet Commonly known as: TYLENOL Take 1,000 mg by mouth as needed (pain).   amLODipine 5 MG tablet Commonly known as: NORVASC Take 1 tablet (5 mg total) by mouth daily.   aspirin EC 81 MG tablet Take 81 mg by mouth daily as needed for moderate pain. Swallow whole.   gabapentin 400 MG capsule Commonly known as: NEURONTIN Take 1 capsule (400 mg total) by mouth 2 (two) times daily.   isosorbide mononitrate 30 MG 24 hr tablet Commonly known as: IMDUR Take 1 tablet (30 mg total) by mouth daily.   metFORMIN 500 MG tablet Commonly known as: Glucophage Take 1 tablet (500 mg total) by mouth 2 (two) times daily with a meal.   metoprolol succinate 50 MG 24 hr tablet Commonly known as: TOPROL-XL Take 1 tablet (50 mg total) by mouth daily. Take with or immediately following a meal.   Prodigy Autocode Blood Glucose w/Device Kit 1 kit by Does not apply route in the morning, at noon, in the evening, and at bedtime.   rosuvastatin 20 MG tablet Commonly known as: CRESTOR Take 1 tablet (20 mg total) by mouth daily.            Discharge Care Instructions  (From admission,  onward)         Start     Ordered   03/11/20 0000  Discharge wound care:       Comments: Wash LLE with soap and water. Pat dry. Apply Sween Moisturizing Ointment (pink and white tube in clean utility). DO NOT APPLY BETWEEN TOES OR ON PLANTAR SURFACE OF FOOT. Place a foam dressing over the wounds on the plantar surface of the foot. Beginning behind the toes and going to just below the knees, spiral wrap kerlex, then 4 inch ace wrap. Perform daily.   03/11/20 0854         No Known Allergies    The results of significant diagnostics from this hospitalization (including imaging, microbiology, ancillary and laboratory) are listed below for reference.    Significant Diagnostic Studies: Portable chest 1 View  Result Date: 03/09/2020 CLINICAL DATA:  Shortness of breath.  Hazy bibasilar opacities. EXAM: PORTABLE CHEST 1 VIEW COMPARISON:  March 08, 2020 FINDINGS: Cardiomegaly. No pneumothorax. No nodules or masses. No focal infiltrate seen on today's study. IMPRESSION: Cardiomegaly. No focal infiltrate identified. No acute abnormality noted. Electronically Signed   By: Dorise Bullion III M.D   On: 03/09/2020 08:43   DG Chest Portable 1 View  Result Date: 03/08/2020 CLINICAL  DATA:  Fatigue and shortness of breath. EXAM: PORTABLE CHEST 1 VIEW COMPARISON:  Single-view of the chest 07/30/2019. PA and lateral chest 05/16/2019. FINDINGS: There is cardiomegaly. Hazy bibasilar airspace disease is worse on the left. No pulmonary edema, pneumothorax or pleural effusion. No acute or focal bony abnormality. IMPRESSION: Hazy bibasilar airspace disease is worse on the left and could be due to atelectasis or pneumonia. Cardiomegaly without edema. Electronically Signed   By: Inge Rise M.D.   On: 03/08/2020 13:02    Microbiology: Recent Results (from the past 240 hour(s))  Resp Panel by RT-PCR (Flu A&B, Covid) Nasopharyngeal Swab     Status: None   Collection Time: 03/08/20  6:13 PM   Specimen:  Nasopharyngeal Swab; Nasopharyngeal(NP) swabs in vial transport medium  Result Value Ref Range Status   SARS Coronavirus 2 by RT PCR NEGATIVE NEGATIVE Final    Comment: (NOTE) SARS-CoV-2 target nucleic acids are NOT DETECTED.  The SARS-CoV-2 RNA is generally detectable in upper respiratory specimens during the acute phase of infection. The lowest concentration of SARS-CoV-2 viral copies this assay can detect is 138 copies/mL. A negative result does not preclude SARS-Cov-2 infection and should not be used as the sole basis for treatment or other patient management decisions. A negative result may occur with  improper specimen collection/handling, submission of specimen other than nasopharyngeal swab, presence of viral mutation(s) within the areas targeted by this assay, and inadequate number of viral copies(<138 copies/mL). A negative result must be combined with clinical observations, patient history, and epidemiological information. The expected result is Negative.  Fact Sheet for Patients:  EntrepreneurPulse.com.au  Fact Sheet for Healthcare Providers:  IncredibleEmployment.be  This test is no t yet approved or cleared by the [place] FDA and  has been authorized for detection and/or diagnosis of SARS-CoV-2 by FDA under an Emergency Use Authorization (EUA). This EUA will remain  in effect (meaning this test can be used) for the duration of the COVID-19 declaration under Section 564(b)(1) of the Act, 21 U.S.C.section 360bbb-3(b)(1), unless the authorization is terminated  or revoked sooner.       Influenza A by PCR NEGATIVE NEGATIVE Final   Influenza B by PCR NEGATIVE NEGATIVE Final    Comment: (NOTE) The Xpert Xpress SARS-CoV-2/FLU/RSV plus assay is intended as an aid in the diagnosis of influenza from Nasopharyngeal swab specimens and should not be used as a sole basis for treatment. Nasal washings and aspirates are unacceptable for  Xpert Xpress SARS-CoV-2/FLU/RSV testing.  Fact Sheet for Patients: EntrepreneurPulse.com.au  Fact Sheet for Healthcare Providers: IncredibleEmployment.be  This test is not yet approved or cleared by the [place] FDA and has been authorized for detection and/or diagnosis of SARS-CoV-2 by FDA under an Emergency Use Authorization (EUA). This EUA will remain in effect (meaning this test can be used) for the duration of the COVID-19 declaration under Section 564(b)(1) of the Act, 21 U.S.C. section 360bbb-3(b)(1), unless the authorization is terminated or revoked.  Performed at [facility], [place] [address], [place], [place] [address]      Labs: Basic Metabolic Panel: Recent Labs  Lab 03/08/20 1109 03/09/20 0505 03/10/20 0442 03/11/20 0348  NA 136 134* 133* 136  K 6.1* 5.0 4.9 4.6  CL 105 106 107 107  CO2 21* 19* 17* 22  GLUCOSE 117* 86 91 111*  BUN 47* 45* 48* 44*  CREATININE 1.92* 1.44* 1.33* 1.23*  CALCIUM 8.5* 8.3* 8.5* 8.7*  MG  --  2.3 2.3 2.3  PHOS  --  5.2* 4.3 4.1   Liver Function Tests: Recent Labs  Lab 03/09/20 0505 03/10/20 0442 03/11/20 0348  AST '17 18 18  ' ALT '12 10 9  ' ALKPHOS 62 55 56  BILITOT 0.6 0.6 0.6  PROT 7.2 8.0 7.3  ALBUMIN 3.0* 2.9* 2.7*   No results for input(s): LIPASE, AMYLASE in the last 168 hours. No results for input(s): AMMONIA in the last 168 hours. CBC: Recent Labs  Lab 03/08/20 1109 03/08/20 1751 03/09/20 0505 03/10/20 0442 03/11/20 0348  WBC 4.1  --  2.6* 2.5* 2.1*  HGB 9.2*  --  8.2* 8.3* 8.4*  HCT 30.7*  --  27.7* 26.3* 27.1*  MCV 103.4*  --  102.6* 99.6 100.4*  PLT 7* 6* 10* 13* 73*   Cardiac Enzymes: No results for input(s): CKTOTAL, CKMB, CKMBINDEX, TROPONINI in the last 168 hours. BNP: BNP (last 3 results) Recent Labs    04/14/19 0842 04/15/19 0418 03/08/20 1309  BNP 452.2* 415.0* 824.4*    ProBNP (last 3 results) No results for input(s): PROBNP in the last  8760 hours.  CBG: Recent Labs  Lab 03/10/20 2019 03/11/20 0021 03/11/20 0341 03/11/20 0611 03/11/20 0737  GLUCAP 146* 103* 114* 99 97       Signed:  Jere Bostrom  [facility] 03/11/2020, 8:54 AM

## 2020-03-11 NOTE — Telephone Encounter (Signed)
Left message with follow-up appointment per 3/8 schedule message. Gave option to call back to reschedule if needed. 

## 2020-03-11 NOTE — TOC Transition Note (Signed)
Transition of Care [facility]) - CM/SW Discharge Note   Patient Details  Name: [name] MRN: [id number] Date of Birth: [birth year]  Transition of Care [facility] CM/SW Contact:  Leone Haven, RN Phone Number: 03/11/2020, 9:30 AM   Clinical Narrative:    Patient from home alone, she is active with [facility] for [facility] per Pearson Grippe,  NCM offered choice to patient ,she would like to continue with [facility] for [facility].  NCM informed MD to put order in for [facility].  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  Patient has transportation. She is for dc today, NCM notified Kenzie with [facility].    Final next level of care: Home w Home Health Services Barriers to Discharge: No Barriers Identified   Patient Goals and CMS Choice Patient states their goals for this hospitalization and ongoing recovery are:: get better CMS Medicare.gov Compare Post Acute Care list provided to:: Patient Choice offered to / list presented to : Patient  Discharge Placement                       Discharge Plan and Services In-house Referral: NA Discharge Planning Services: CM Consult Post Acute Care Choice: Home Health            DME Agency: NA       HH Arranged: RN HH Agency: [facility] Date HH Agency Contacted: 03/10/20 Time HH Agency Contacted: 0919 Representative spoke with at [facility] Agency: Pearson Grippe  Social Determinants of Health (SDOH) Interventions     Readmission Risk Interventions Readmission Risk Prevention Plan 03/11/2020 04/13/2019 11/13/2018  Transportation Screening Complete Complete Complete  PCP or Specialist Appt within 5-7 Days - - Complete  PCP or Specialist Appt within 3-5 Days Complete Complete -  Home Care Screening - - Complete  Medication Review (RN CM) - - Complete  HRI or Home Care Consult Complete Complete -  Social Work Consult for Recovery Care Planning/Counseling Complete Complete -  Palliative  Care Screening Not Applicable Not Applicable -  Medication Review Oceanographer) Complete Complete -  Some recent data might be hidden

## 2020-03-11 NOTE — Discharge Instructions (Signed)
Idiopathic Thrombocytopenic Purpura Idiopathic thrombocytopenic purpura (ITP) is a disease in which the body's disease-fighting system (immune system) attacks platelets in the body. Platelets are blood cells that clump together to form clots. Blood clots help stop bleeding in the body. A person with ITP has too few platelets. As a result, it is harder for the blood to clot. A person may bruise and bleed easily, such as bleeding a lot from minor cuts and scrapes. ITP can affect both children and adults. It is usually a short-term (acute) condition in children and a long-term (chronic) condition in adults. What are the causes? The cause of ITP is not known. In some cases, this condition may develop:  After a viral infection.  During pregnancy.  After developing an immune system disorder. What increases the risk? You may be more likely to develop this condition if you:  Are female.  Are 62-78 years old. What are the signs or symptoms? If you have a mild case, you may not have any symptoms. In more serious cases, symptoms may include:  Bruising easily.  Minor injuries, like cuts and scrapes, that bleed for a long time.  Small red or purple dots under your skin (petechiae), especially on your shins.  Blood in the urine or stool (feces).  Nosebleeds.  Bleeding gums.  Heavy menstrual periods in women. How is this diagnosed? This condition may be diagnosed based on:  Your symptoms and medical history.  A physical exam.  Blood tests.  Tests of the spongy tissue inside your bones (bone marrow).   How is this treated? Treatment depends on how severe your condition is. Treatment may include:  Monitoring your symptoms and your platelet count over time. You may need to see your health care provider for blood tests on a regular basis.  Receiving donated blood products (transfusions), such as platelets.  Medicines to: ? Reduce inflammation (steroids). ? Increase how many platelets  your body makes. ? Reduce the activity of your immune system.  Surgery to remove your spleen, if other treatments are not effective. The spleen is an organ in your upper left abdomen. It stores blood cells and is involved in some immune system functions. In ITP, the spleen releases proteins (antibodies) that mistakenly attack platelets. Follow these instructions at home: Medicines  Take over-the-counter and prescription medicines only as told by your health care provider. Do not take the following unless your health care provider approves: ? Over-the-counter medicines that contain aspirin. ? NSAIDs such as ibuprofen and naproxen.  Talk with your health care provider before you take any new medicines. Certain medicines may increase your risk for dangerous bleeding. Preventing falls  Follow instructions from your health care provider about ways that you can help prevent falls and injuries at home. These may include: ? Removing loose rugs, cords, and other tripping hazards from walkways. ? Installing grab bars in bathrooms. ? Using night-lights.   General instructions  Tell all your health care providers, including your dentist, that you have a bleeding disorder. Make sure to tell providers before you have any procedure done, including dental cleanings.  Do not play contact sports or do activities that have a high risk for injury or bruising. Ask your health care provider what activities are safe for you.  Brush your teeth using a soft toothbrush.  When shaving, use an electric razor instead of a blade.  Wear a medical alert bracelet that says that you have a bleeding disorder. This can help you get the treatment  you need in case of emergency.  Keep all follow-up visits as told by your health care provider. This is important. You may need regular blood tests. Contact a health care provider if you have:  New symptoms.  Symptoms that get worse.  A fever. Get help right away if you  have:  A sudden, severe headache.  Sudden, severe nausea.  Severe bleeding.  Vomiting. Summary  Idiopathic thrombocytopenic purpura (ITP) is a disease in which the body's disease-fighting system (immune system) attacks blood cells that form clots to help stop bleeding in the body (platelets).  ITP can lead to bruising and bleeding easily, including frequent nosebleeds, blood in the urine or stool, and bleeding gums. In women, ITP can lead to heavy menstrual periods.  Treatment depends on how severe your condition is. It may include steroid therapy and medicines that reduce the activity of the immune system. In some cases, surgery is needed to remove the spleen.  Follow your health care provider's instructions about taking medicines, preventing falls, restricting some activities, and when to get help. This information is not intended to replace advice given to you by your health care provider. Make sure you discuss any questions you have with your health care provider. Document Revised: 06/01/2019 Document Reviewed: 06/01/2019 Elsevier Patient Education  2021 [employer].  

## 2020-03-11 NOTE — TOC Initial Note (Signed)
Transition of Care [facility]) - Initial/Assessment Note    Patient Details  Name: [name] MRN: [id number] Date of Birth: 06/10/1965  Transition of Care [facility]) CM/SW Contact:    Leone Haven, RN Phone Number: 03/11/2020, 9:24 AM  Clinical Narrative:                 Patient from home alone, she is active with [facility] for [facility] per Pearson Grippe,  NCM offered choice to patient ,she would like to continue with [facility] for [facility].  NCM informed MD to put order in for [facility].  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  Patient has transportation. She is for dc today, NCM notified Kenzie with [facility]   Expected Discharge Plan: Home w Home Health Services Barriers to Discharge: No Barriers Identified   Patient Goals and CMS Choice Patient states their goals for this hospitalization and ongoing recovery are:: get better CMS Medicare.gov Compare Post Acute Care list provided to:: Patient Choice offered to / list presented to : Patient  Expected Discharge Plan and Services Expected Discharge Plan: Home w Home Health Services In-house Referral: NA Discharge Planning Services: CM Consult Post Acute Care Choice: Home Health   Expected Discharge Date: 03/11/20                 DME Agency: NA       HH Arranged: RN HH Agency: [facility] Date HH Agency Contacted: 03/10/20 Time HH Agency Contacted: 0919 Representative spoke with at [facility] Agency: Pearson Grippe  Prior Living Arrangements/Services   Lives with:: Self Patient language and need for interpreter reviewed:: Yes Do you feel safe going back to the place where you live?: Yes      Need for Family Participation in Patient Care: No (Comment)   Current home services: Home RN Criminal Activity/Legal Involvement Pertinent to Current Situation/Hospitalization: No - Comment as needed  Activities of Daily Living Home Assistive Devices/Equipment: Wheelchair ADL  Screening (condition at time of admission) Patient's cognitive ability adequate to safely complete daily activities?: No Is the patient deaf or have difficulty hearing?: No Does the patient have difficulty seeing, even when wearing glasses/contacts?: No Does the patient have difficulty concentrating, remembering, or making decisions?: No Patient able to express need for assistance with ADLs?: Yes Does the patient have difficulty dressing or bathing?: Yes Independently performs ADLs?: Yes (appropriate for developmental age) Does the patient have difficulty walking or climbing stairs?: Yes Weakness of Legs: Left (right BKA) Weakness of Arms/Hands: None  Permission Sought/Granted                  Emotional Assessment   Attitude/Demeanor/Rapport: Engaged Affect (typically observed): Appropriate Orientation: : Oriented to Self,Oriented to Place,Oriented to  Time,Oriented to Situation Alcohol / Substance Use: Not Applicable Psych Involvement: No (comment)  Admission diagnosis:  Hyperkalemia [E87.5] SOB (shortness of breath) [R06.02] Thrombocytopenia (HCC) [D69.6] Chronic ITP (idiopathic thrombocytopenia) (HCC) [D69.3] AKI (acute kidney injury) (HCC) [N17.9] Patient Active Problem List   Diagnosis Date Noted  . Chronic ITP (idiopathic thrombocytopenia) (HCC) 03/08/2020  . Acute on chronic respiratory failure with hypoxia (HCC) 03/08/2020  . Cellulitis of foot 08/02/2019  . Nausea and vomiting 06/26/2019  . Hematuria 06/26/2019  . Hypertensive urgency 06/26/2019  . Acute diastolic CHF (congestive heart failure) (HCC)   . CHF (congestive heart failure) (HCC) 04/11/2019  . Acute exacerbation of CHF (congestive heart failure) (HCC)  04/11/2019  . Leukopenia 04/11/2019  . History of ITP 04/11/2019  . Idiopathic thrombocytopenic purpura (ITP) (HCC) 11/16/2018  . Onychomycosis of toenail 10/16/2018  . Cataract of both eyes 10/16/2018  . Tinea pedis of left foot 10/16/2018  .  Decreased vision in both eyes 10/16/2018  . CKD (chronic kidney disease) stage 3, GFR 30-59 ml/min   . Cellulitis of left lower extremity 01/25/2018  . Hyperkalemia 01/25/2018  . Poor compliance 03/08/2017  . Hypoxia 03/07/2017  . Thyroid nodule 03/07/2017  . Chronic indwelling Foley catheter 03/04/2017  . Type II diabetes mellitus (HCC)   . Femur fracture (HCC) 03/02/2017  . Orthostatic dizziness   . Urinary tract infection without hematuria   . Enterococcus faecalis infection   . Near syncope 11/25/2015  . Dehydration 11/25/2015  . Orthostatic hypotension 11/25/2015  . UTI (urinary tract infection) 11/25/2015  . Acute worsening of stage 3 chronic kidney disease (HCC) 09/24/2015  . Diabetes mellitus due to underlying condition with chronic kidney disease, without long-term current use of insulin (HCC)   . Fever   . Status post below knee amputation of right lower extremity (HCC)   . Diabetic peripheral neuropathy (HCC)   . Neuropathic pain   . Benign essential HTN   . Folliculitis   . Slow transit constipation   . Anemia of chronic disease   . Bilateral hydronephrosis   . Urinary retention   . CKD (chronic kidney disease)   . Uncontrolled type 2 diabetes mellitus with diabetic nephropathy, without long-term current use of insulin (HCC)   . Vasculopathy   . Debility 09/04/2015  . DM type 2 with diabetic peripheral neuropathy (HCC)   . S/P BKA (below knee amputation) unilateral (HCC)   . Medical non-compliance   . Benign skin lesion of multiple sites   . Tachypnea   . Acute blood loss anemia   . Neurogenic bladder   . Occult blood positive stool   . Rectovaginal fistula   . Controlled diabetes mellitus type 2 with complications (HCC)   . Acute on chronic renal failure (HCC)   . Hydronephrosis determined by ultrasound   . Papular rash   . Uncontrolled type 2 diabetes mellitus with complication (HCC)   . Paresthesia   . Thrombocytopenia (HCC) 08/23/2015  . Pressure ulcer  08/23/2015  . Severe anemia 08/23/2015  . Lower urinary tract infectious disease 08/23/2015  . Absolute anemia 08/23/2015  . Peripheral vascular disease, unspecified (HCC) 04/27/2012  . Unilateral complete BKA (HCC) 07/09/2011  . Gas gangrene (HCC) 07/02/2011  . Sepsis(995.91) 07/02/2011  . Acute kidney injury superimposed on chronic kidney disease (HCC) 07/02/2011  . Diabetes mellitus (HCC) 11/22/2010  . Diabetic foot ulcer associated with type 2 diabetes mellitus (HCC)   . Essential hypertension   . Arthritis   . Neuropathy (HCC)   . Intracerebral hemorrhage (HCC)    PCP:  Patient, No Pcp Per Pharmacy:   [facility] - [place], [place] - [address] [place] [address] Phone: [phone] Fax: [phone]     Social Determinants of Health (SDOH) Interventions    Readmission Risk Interventions Readmission Risk Prevention Plan 03/11/2020 04/13/2019 11/13/2018  Transportation Screening Complete Complete Complete  PCP or Specialist Appt within 5-7 Days - - Complete  PCP or Specialist Appt within 3-5 Days Complete Complete -  Home Care Screening - - Complete  Medication Review (RN CM) - - Complete  HRI or Home Care Consult Complete Complete -  Social  Work Librarian, academic for Recovery Care Planning/Counseling Complete Complete -  Palliative Care Screening Not Applicable Not Applicable -  Medication Review (RN Care Manager) Complete Complete -  Some recent data might be hidden

## 2020-03-11 NOTE — Progress Notes (Signed)
Adminstered 1x dose of Pfizer vaccine in the patient's L deltoid. Patient stated she feels fine after the 15 mins. CDC fact sheet at bedside. Will continue to monitor.

## 2020-03-14 DIAGNOSIS — I890 Lymphedema, not elsewhere classified: Secondary | ICD-10-CM | POA: Diagnosis not present

## 2020-03-14 DIAGNOSIS — D631 Anemia in chronic kidney disease: Secondary | ICD-10-CM | POA: Diagnosis not present

## 2020-03-14 DIAGNOSIS — E1151 Type 2 diabetes mellitus with diabetic peripheral angiopathy without gangrene: Secondary | ICD-10-CM | POA: Diagnosis not present

## 2020-03-14 DIAGNOSIS — M86672 Other chronic osteomyelitis, left ankle and foot: Secondary | ICD-10-CM | POA: Diagnosis not present

## 2020-03-14 DIAGNOSIS — D693 Immune thrombocytopenic purpura: Secondary | ICD-10-CM | POA: Diagnosis not present

## 2020-03-14 DIAGNOSIS — I130 Hypertensive heart and chronic kidney disease with heart failure and stage 1 through stage 4 chronic kidney disease, or unspecified chronic kidney disease: Secondary | ICD-10-CM | POA: Diagnosis not present

## 2020-03-14 DIAGNOSIS — N179 Acute kidney failure, unspecified: Secondary | ICD-10-CM | POA: Diagnosis not present

## 2020-03-14 DIAGNOSIS — N1832 Chronic kidney disease, stage 3b: Secondary | ICD-10-CM | POA: Diagnosis not present

## 2020-03-14 DIAGNOSIS — L97422 Non-pressure chronic ulcer of left heel and midfoot with fat layer exposed: Secondary | ICD-10-CM | POA: Diagnosis not present

## 2020-03-14 DIAGNOSIS — E875 Hyperkalemia: Secondary | ICD-10-CM | POA: Diagnosis not present

## 2020-03-14 DIAGNOSIS — E1169 Type 2 diabetes mellitus with other specified complication: Secondary | ICD-10-CM | POA: Diagnosis not present

## 2020-03-14 DIAGNOSIS — I5032 Chronic diastolic (congestive) heart failure: Secondary | ICD-10-CM | POA: Diagnosis not present

## 2020-03-14 DIAGNOSIS — J9621 Acute and chronic respiratory failure with hypoxia: Secondary | ICD-10-CM | POA: Diagnosis not present

## 2020-03-14 DIAGNOSIS — E1140 Type 2 diabetes mellitus with diabetic neuropathy, unspecified: Secondary | ICD-10-CM | POA: Diagnosis not present

## 2020-03-14 DIAGNOSIS — E11621 Type 2 diabetes mellitus with foot ulcer: Secondary | ICD-10-CM | POA: Diagnosis not present

## 2020-03-14 DIAGNOSIS — E1122 Type 2 diabetes mellitus with diabetic chronic kidney disease: Secondary | ICD-10-CM | POA: Diagnosis not present

## 2020-03-16 NOTE — Progress Notes (Incomplete)
Patient Care Team: Patient, No Pcp Per as PCP - General (General Practice)  DIAGNOSIS: No diagnosis found.  CHIEF COMPLIANT: Follow-up of ITP, recent hospitalization  INTERVAL HISTORY: [name] is a 55 y.o. with above-mentioned history of ITP previously treated with IVIG.She was sent to the ED on 03/08/20 after labs showed platelets 5, creatinine 1.66, and potassium 6.2 and was admitted until 03/11/20. She received Nplate and platelets improved to 73. She presents to the clinic todayforfollow-up.  ALLERGIES:  has No Known Allergies.  MEDICATIONS:  Current Outpatient Medications  Medication Sig Dispense Refill  . acetaminophen (TYLENOL) 500 MG tablet Take 1,000 mg by mouth as needed (pain).    Marland Kitchen amLODipine (NORVASC) 5 MG tablet Take 1 tablet (5 mg total) by mouth daily. 90 tablet 3  . aspirin EC 81 MG tablet Take 81 mg by mouth daily as needed for moderate pain. Swallow whole.    . Blood Glucose Monitoring Suppl (PRODIGY AUTOCODE BLOOD GLUCOSE) w/Device KIT 1 kit by Does not apply route in the morning, at noon, in the evening, and at bedtime. 1 kit 0  . gabapentin (NEURONTIN) 400 MG capsule Take 1 capsule (400 mg total) by mouth 2 (two) times daily. 180 capsule 3  . isosorbide mononitrate (IMDUR) 30 MG 24 hr tablet Take 1 tablet (30 mg total) by mouth daily. 90 tablet 3  . metFORMIN (GLUCOPHAGE) 500 MG tablet Take 1 tablet (500 mg total) by mouth 2 (two) times daily with a meal. 180 tablet 3  . metoprolol succinate (TOPROL-XL) 50 MG 24 hr tablet Take 1 tablet (50 mg total) by mouth daily. Take with or immediately following a meal. 90 tablet 3  . rosuvastatin (CRESTOR) 20 MG tablet Take 1 tablet (20 mg total) by mouth daily. 90 tablet 3   No current facility-administered medications for this visit.    PHYSICAL EXAMINATION: ECOG PERFORMANCE STATUS: {CHL ONC ECOG PS:[id number]}  There were no vitals filed for this visit. There were no vitals filed for this  visit.  LABORATORY DATA:  I have reviewed the data as listed CMP Latest Ref Rng & Units 03/11/2020 03/10/2020 03/09/2020  Glucose 70 - 99 mg/dL 111(H) 91 86  BUN 6 - 20 mg/dL 44(H) 48(H) 45(H)  Creatinine 0.44 - 1.00 mg/dL 1.23(H) 1.33(H) 1.44(H)  Sodium 135 - 145 mmol/L 136 133(L) 134(L)  Potassium 3.5 - 5.1 mmol/L 4.6 4.9 5.0  Chloride 98 - 111 mmol/L 107 107 106  CO2 22 - 32 mmol/L 22 17(L) 19(L)  Calcium 8.9 - 10.3 mg/dL 8.7(L) 8.5(L) 8.3(L)  Total Protein 6.5 - 8.1 g/dL 7.3 8.0 7.2  Total Bilirubin 0.3 - 1.2 mg/dL 0.6 0.6 0.6  Alkaline Phos 38 - 126 U/L 56 55 62  AST 15 - 41 U/L '18 18 17  ' ALT 0 - 44 U/L '9 10 12    ' Lab Results  Component Value Date   WBC 2.1 (L) 03/11/2020   HGB 8.4 (L) 03/11/2020   HCT 27.1 (L) 03/11/2020   MCV 100.4 (H) 03/11/2020   PLT 73 (L) 03/11/2020   NEUTROABS 4.3 09/19/2019    ASSESSMENT & PLAN:  No problem-specific Assessment & Plan notes found for this encounter.    No orders of the defined types were placed in this encounter.  The patient has a good understanding of the overall plan. she agrees with it. she will call with any problems that may develop before the next visit here.  Total time spent: *** mins including face to  face time and time spent for planning, charting and coordination of care  Rulon Eisenmenger, MD, MPH 03/16/2020  I, Molly Dorshimer, am acting as scribe for Dr. Nicholas Lose.  {insert scribe attestation}

## 2020-03-17 ENCOUNTER — Inpatient Hospital Stay: Payer: Medicare Other | Admitting: Hematology and Oncology

## 2020-03-17 ENCOUNTER — Other Ambulatory Visit: Payer: Medicare Other

## 2020-03-17 ENCOUNTER — Inpatient Hospital Stay: Payer: Medicare Other

## 2020-03-17 ENCOUNTER — Ambulatory Visit: Payer: Medicare Other | Admitting: Hematology and Oncology

## 2020-03-17 NOTE — Assessment & Plan Note (Deleted)
Relapsed Refractory ITP: Relapsing ITP: Patient response to steroids and IVIG but because she is a diabetic we cannot use steroids. Normally responds to IV IG Prior IVIG: March 2021, April 2021, June 2021, Feb 2022 Lab review: Platelet count 16 on 05/16/2019 ----------------------------------------------------------------------------------------------------------------------------------------------------------------- Prior treatment: Rituxan weekly x4 started 05/23/2019 (patient missed her treatment on 05/30/2019)today is third treatment (patient missed multiple appointments)  Hospitalization: 06/25/2019-06/29/2019: Nausea and vomiting Hospitalization 08/02/2019-08/09/2019 cellulitis left foot Hospitalization 03/08/2020-03/11/2020: Relapsed ITP and renal failure: Improved with Nplate and IVIG  08/08/2019: Platelets 301, hemoglobin 8.6 09/05/2019: Platelets 110, hemoglobin 11.5 02/28/2020: Platelets 14, hemoglobin 10.1, creatinine 1.63, potassium 6.3 (this is after 2 doses of IVIG) 03/17/2020:  Return to clinic weekly for Nplate treatments.  

## 2020-03-17 NOTE — Progress Notes (Signed)
Patient Care Team: Patient, No Pcp Per as PCP - General (General Practice)  DIAGNOSIS:    ICD-10-CM   1. Idiopathic thrombocytopenic purpura (ITP) (HCC)  D69.3     CHIEF COMPLIANT: Follow-up of ITP, recent hospitalization  INTERVAL HISTORY: [name] is a 55 y.o. with above-mentioned history of ITP previously treated withIVIG.She was sent to the ED on 03/08/20 after labs showed platelets 5, creatinine 1.66, and potassium 6.2 and was admitted until 03/11/20. She received Nplate and platelets improved to 73. She presentsto the clinictodayforfollow-up. She complains of easy bruising on her upper extremities.  ALLERGIES:  has No Known Allergies.  MEDICATIONS:  Current Outpatient Medications  Medication Sig Dispense Refill  . acetaminophen (TYLENOL) 500 MG tablet Take 1,000 mg by mouth as needed (pain).    Marland Kitchen amLODipine (NORVASC) 5 MG tablet Take 1 tablet (5 mg total) by mouth daily. 90 tablet 3  . aspirin EC 81 MG tablet Take 81 mg by mouth daily as needed for moderate pain. Swallow whole.    . Blood Glucose Monitoring Suppl (PRODIGY AUTOCODE BLOOD GLUCOSE) w/Device KIT 1 kit by Does not apply route in the morning, at noon, in the evening, and at bedtime. 1 kit 0  . gabapentin (NEURONTIN) 400 MG capsule Take 1 capsule (400 mg total) by mouth 2 (two) times daily. 180 capsule 3  . isosorbide mononitrate (IMDUR) 30 MG 24 hr tablet Take 1 tablet (30 mg total) by mouth daily. 90 tablet 3  . metFORMIN (GLUCOPHAGE) 500 MG tablet Take 1 tablet (500 mg total) by mouth 2 (two) times daily with a meal. 180 tablet 3  . metoprolol succinate (TOPROL-XL) 50 MG 24 hr tablet Take 1 tablet (50 mg total) by mouth daily. Take with or immediately following a meal. 90 tablet 3  . rosuvastatin (CRESTOR) 20 MG tablet Take 1 tablet (20 mg total) by mouth daily. 90 tablet 3   No current facility-administered medications for this visit.    PHYSICAL EXAMINATION: ECOG PERFORMANCE STATUS: 1 -  Symptomatic but completely ambulatory  Vitals:   03/18/20 0832  BP: (!) 139/55  Pulse: 77  Resp: 18  Temp: 97.9 F (36.6 C)  SpO2: 93%   Filed Weights   03/18/20 0832  Weight: 219 lb 12.8 oz (99.7 kg)    LABORATORY DATA:  I have reviewed the data as listed CMP Latest Ref Rng & Units 03/18/2020 03/11/2020 03/10/2020  Glucose 70 - 99 mg/dL 99 111(H) 91  BUN 6 - 20 mg/dL 30(H) 44(H) 48(H)  Creatinine 0.44 - 1.00 mg/dL 0.93 1.23(H) 1.33(H)  Sodium 135 - 145 mmol/L 136 136 133(L)  Potassium 3.5 - 5.1 mmol/L 4.9 4.6 4.9  Chloride 98 - 111 mmol/L 108 107 107  CO2 22 - 32 mmol/L 22 22 17(L)  Calcium 8.9 - 10.3 mg/dL 8.5(L) 8.7(L) 8.5(L)  Total Protein 6.5 - 8.1 g/dL 8.2(H) 7.3 8.0  Total Bilirubin 0.3 - 1.2 mg/dL 0.5 0.6 0.6  Alkaline Phos 38 - 126 U/L 77 56 55  AST 15 - 41 U/L _0 ALT 0 - 44 U/L _1 Lab Results  Component Value Date   WBC 3.0 (L) 03/18/2020   HGB 9.0 (L) 03/18/2020   HCT 29.4 (L) 03/18/2020   MCV 96.1 03/18/2020   PLT 74 (L) 03/18/2020   NEUTROABS 2.0 03/18/2020    ASSESSMENT & PLAN:  Idiopathic thrombocytopenic purpura (ITP) (HCC) Relapsed Refractory ITP: Relapsing ITP: Patient response to steroids and  IVIG but because she is a diabetic we cannot use steroids. Normally responds to IV IG Prior IVIG: March 2021, April 2021, June 2021, Feb 2022 Lab review: Platelet count 16 on 05/16/2019 ----------------------------------------------------------------------------------------------------------------------------------------------------------------- Prior treatment: Rituxan weekly x4 started 05/23/2019 (patient missed her treatment on 05/30/2019)today is third treatment (patient missed multiple appointments)  Hospitalization: 06/25/2019-06/29/2019: Nausea and vomiting Hospitalization 08/02/2019-08/09/2019 cellulitis left foot Hospitalization 03/08/2020-03/11/2020: Relapsed ITP and renal failure: Improved with Nplate and IVIG  08/04/173: Platelets 301,  hemoglobin 8.6 09/05/2019: Platelets 110, hemoglobin 11.5 02/28/2020: Platelets 14, hemoglobin 10.1, creatinine 1.63, potassium 6.3 (this is after 2 doses of IVIG) 03/17/2020: Platelets 74, hemoglobin 9  Return to clinic weekly for Nplate treatments. I will see the patient in 2 weeks for follow-up.   No orders of the defined types were placed in this encounter.  The patient has a good understanding of the overall plan. she agrees with it. she will call with any problems that may develop before the next visit here.  Total time spent: 30 mins including face to face time and time spent for planning, charting and coordination of care  Rulon Eisenmenger, MD, MPH 03/18/2020  I, Molly Dorshimer, am acting as scribe for Dr. Nicholas Lose.  I have reviewed the above documentation for accuracy and completeness, and I agree with the above.    1

## 2020-03-17 NOTE — Assessment & Plan Note (Signed)
Relapsed Refractory ITP: Relapsing ITP: Patient response to steroids and IVIG but because she is a diabetic we cannot use steroids. Normally responds to IV IG Prior IVIG: March 2021, April 2021, June 2021, Feb 2022 Lab review: Platelet count 16 on 05/16/2019 ----------------------------------------------------------------------------------------------------------------------------------------------------------------- Prior treatment: Rituxan weekly x4 started 05/23/2019 (patient missed her treatment on 05/30/2019)today is third treatment (patient missed multiple appointments)  Hospitalization: 06/25/2019-06/29/2019: Nausea and vomiting Hospitalization 08/02/2019-08/09/2019 cellulitis left foot Hospitalization 03/08/2020-03/11/2020: Relapsed ITP and renal failure: Improved with Nplate and IVIG  08/08/2019: Platelets 301, hemoglobin 8.6 09/05/2019: Platelets 110, hemoglobin 11.5 02/28/2020: Platelets 14, hemoglobin 10.1, creatinine 1.63, potassium 6.3 (this is after 2 doses of IVIG) 03/17/2020:  Return to clinic weekly for Nplate treatments.

## 2020-03-18 ENCOUNTER — Inpatient Hospital Stay: Payer: Medicare Other

## 2020-03-18 ENCOUNTER — Telehealth: Payer: Self-pay | Admitting: Hematology and Oncology

## 2020-03-18 ENCOUNTER — Other Ambulatory Visit: Payer: Self-pay

## 2020-03-18 ENCOUNTER — Inpatient Hospital Stay: Payer: Medicare Other | Attending: Hematology and Oncology | Admitting: Hematology and Oncology

## 2020-03-18 DIAGNOSIS — D693 Immune thrombocytopenic purpura: Secondary | ICD-10-CM

## 2020-03-18 DIAGNOSIS — Z79899 Other long term (current) drug therapy: Secondary | ICD-10-CM | POA: Diagnosis not present

## 2020-03-18 DIAGNOSIS — Z7982 Long term (current) use of aspirin: Secondary | ICD-10-CM | POA: Diagnosis not present

## 2020-03-18 LAB — CMP (CANCER CENTER ONLY)
ALT: 24 U/L (ref 0–44)
AST: 26 U/L (ref 15–41)
Albumin: 3.5 g/dL (ref 3.5–5.0)
Alkaline Phosphatase: 77 U/L (ref 38–126)
Anion gap: 6 (ref 5–15)
BUN: 30 mg/dL — ABNORMAL HIGH (ref 6–20)
CO2: 22 mmol/L (ref 22–32)
Calcium: 8.5 mg/dL — ABNORMAL LOW (ref 8.9–10.3)
Chloride: 108 mmol/L (ref 98–111)
Creatinine: 0.93 mg/dL (ref 0.44–1.00)
GFR, Estimated: >60 mL/min (ref >60)
Glucose, Bld: 99 mg/dL (ref 70–99)
Potassium: 4.9 mmol/L (ref 3.5–5.1)
Sodium: 136 mmol/L (ref 135–145)
Total Bilirubin: 0.5 mg/dL (ref 0.3–1.2)
Total Protein: 8.2 g/dL — ABNORMAL HIGH (ref 6.5–8.1)

## 2020-03-18 LAB — CBC WITH DIFFERENTIAL (CANCER CENTER ONLY)
Abs Immature Granulocytes: 0 10*3/uL (ref 0.00–0.07)
Basophils Absolute: 0 10*3/uL (ref 0.0–0.1)
Basophils Relative: 1 %
Eosinophils Absolute: 0.1 10*3/uL (ref 0.0–0.5)
Eosinophils Relative: 2 %
HCT: 29.4 % — ABNORMAL LOW (ref 36.0–46.0)
Hemoglobin: 9 g/dL — ABNORMAL LOW (ref 12.0–15.0)
Immature Granulocytes: 0 %
Lymphocytes Relative: 19 %
Lymphs Abs: 0.6 10*3/uL — ABNORMAL LOW (ref 0.7–4.0)
MCH: 29.4 pg (ref 26.0–34.0)
MCHC: 30.6 g/dL (ref 30.0–36.0)
MCV: 96.1 fL (ref 80.0–100.0)
Monocytes Absolute: 0.4 10*3/uL (ref 0.1–1.0)
Monocytes Relative: 13 %
Neutro Abs: 2 10*3/uL (ref 1.7–7.7)
Neutrophils Relative %: 65 %
Platelet Count: 74 10*3/uL — ABNORMAL LOW (ref 150–400)
RBC: 3.06 MIL/uL — ABNORMAL LOW (ref 3.87–5.11)
RDW: 16 % — ABNORMAL HIGH (ref 11.5–15.5)
WBC Count: 3 10*3/uL — ABNORMAL LOW (ref 4.0–10.5)
nRBC: 0 % (ref 0.0–0.2)

## 2020-03-18 MED ORDER — ROMIPLOSTIM 125 MCG ~~LOC~~ SOLR
1.0000 ug/kg | Freq: Once | SUBCUTANEOUS | Status: AC
  Administered 2020-03-18: 100 ug via SUBCUTANEOUS
  Filled 2020-03-18: qty 0.2

## 2020-03-18 NOTE — Patient Instructions (Signed)
Romiplostim injection What is this medicine? ROMIPLOSTIM (roe mi PLOE stim) helps your body make more platelets. This medicine is used to treat low platelets caused by chronic idiopathic thrombocytopenic purpura (ITP) or a bone marrow syndrome caused by radiation sickness. This medicine may be used for other purposes; ask your health care provider or pharmacist if you have questions. COMMON BRAND NAME(S): Nplate What should I tell my health care provider before I take this medicine? They need to know if you have any of these conditions:  blood clots  myelodysplastic syndrome  an unusual or allergic reaction to romiplostim, mannitol, other medicines, foods, dyes, or preservatives  pregnant or trying to get pregnant  breast-feeding How should I use this medicine? This medicine is injected under the skin. It is given by a health care provider in a hospital or clinic setting. A special MedGuide will be given to you before each treatment. Be sure to read this information carefully each time. Talk to your health care provider about the use of this medicine in children. While it may be prescribed for children as young as newborns for selected conditions, precautions do apply. Overdosage: If you think you have taken too much of this medicine contact a poison control center or emergency room at once. NOTE: This medicine is only for you. Do not share this medicine with others. What if I miss a dose? Keep appointments for follow-up doses. It is important not to miss your dose. Call your health care provider if you are unable to keep an appointment. What may interact with this medicine? Interactions are not expected. This list may not describe all possible interactions. Give your health care provider a list of all the medicines, herbs, non-prescription drugs, or dietary supplements you use. Also tell them if you smoke, drink alcohol, or use illegal drugs. Some items may interact with your  medicine. What should I watch for while using this medicine? Visit your health care provider for regular checks on your progress. You may need blood work done while you are taking this medicine. Your condition will be monitored carefully while you are receiving this medicine. It is important not to miss any appointments. What side effects may I notice from receiving this medicine? Side effects that you should report to your doctor or health care professional as soon as possible:  allergic reactions (skin rash, itching or hives; swelling of the face, lips, or tongue)  bleeding (bloody or black, tarry stools; red or dark brown urine; spitting up blood or brown material that looks like coffee grounds; red spots on the skin; unusual bruising or bleeding from the eyes, gums, or nose)  blood clot (chest pain; shortness of breath; pain, swelling, or warmth in the leg)  stroke (changes in vision; confusion; trouble speaking or understanding; severe headaches; sudden numbness or weakness of the face, arm or leg; trouble walking; dizziness; loss of balance or coordination) Side effects that usually do not require medical attention (report to your doctor or health care professional if they continue or are bothersome):  diarrhea  dizziness  headache  joint pain  muscle pain  stomach pain  trouble sleeping This list may not describe all possible side effects. Call your doctor for medical advice about side effects. You may report side effects to FDA at 1-800-FDA-1088. Where should I keep my medicine? This medicine is given in a hospital or clinic. It will not be stored at home. NOTE: This sheet is a summary. It may not cover all possible   information. If you have questions about this medicine, talk to your doctor, pharmacist, or health care provider.  2021 Elsevier/Gold Standard (2019-02-05 10:28:13)  

## 2020-03-18 NOTE — Telephone Encounter (Signed)
Scheduled appointments per 3/15 los. Spoke to patient who is aware of appointments dates and times. Gave patient calendar print out.

## 2020-03-21 ENCOUNTER — Encounter (HOSPITAL_BASED_OUTPATIENT_CLINIC_OR_DEPARTMENT_OTHER): Payer: Medicare Other | Attending: Internal Medicine | Admitting: Internal Medicine

## 2020-03-21 ENCOUNTER — Other Ambulatory Visit: Payer: Self-pay

## 2020-03-21 DIAGNOSIS — Z89511 Acquired absence of right leg below knee: Secondary | ICD-10-CM | POA: Diagnosis not present

## 2020-03-21 DIAGNOSIS — I5032 Chronic diastolic (congestive) heart failure: Secondary | ICD-10-CM | POA: Diagnosis not present

## 2020-03-21 DIAGNOSIS — E11621 Type 2 diabetes mellitus with foot ulcer: Secondary | ICD-10-CM | POA: Diagnosis not present

## 2020-03-21 DIAGNOSIS — E1140 Type 2 diabetes mellitus with diabetic neuropathy, unspecified: Secondary | ICD-10-CM | POA: Diagnosis not present

## 2020-03-21 DIAGNOSIS — Z7984 Long term (current) use of oral hypoglycemic drugs: Secondary | ICD-10-CM | POA: Diagnosis not present

## 2020-03-21 DIAGNOSIS — D693 Immune thrombocytopenic purpura: Secondary | ICD-10-CM | POA: Diagnosis not present

## 2020-03-21 DIAGNOSIS — L97523 Non-pressure chronic ulcer of other part of left foot with necrosis of muscle: Secondary | ICD-10-CM | POA: Insufficient documentation

## 2020-03-21 DIAGNOSIS — N183 Chronic kidney disease, stage 3 unspecified: Secondary | ICD-10-CM | POA: Diagnosis not present

## 2020-03-21 DIAGNOSIS — Z8673 Personal history of transient ischemic attack (TIA), and cerebral infarction without residual deficits: Secondary | ICD-10-CM | POA: Insufficient documentation

## 2020-03-21 DIAGNOSIS — I130 Hypertensive heart and chronic kidney disease with heart failure and stage 1 through stage 4 chronic kidney disease, or unspecified chronic kidney disease: Secondary | ICD-10-CM | POA: Diagnosis not present

## 2020-03-21 DIAGNOSIS — E1122 Type 2 diabetes mellitus with diabetic chronic kidney disease: Secondary | ICD-10-CM | POA: Diagnosis not present

## 2020-03-21 DIAGNOSIS — Z993 Dependence on wheelchair: Secondary | ICD-10-CM | POA: Insufficient documentation

## 2020-03-21 DIAGNOSIS — L97522 Non-pressure chronic ulcer of other part of left foot with fat layer exposed: Secondary | ICD-10-CM | POA: Diagnosis not present

## 2020-03-24 ENCOUNTER — Inpatient Hospital Stay: Payer: Medicare Other | Admitting: Nurse Practitioner

## 2020-03-25 NOTE — Progress Notes (Signed)
Cheryl, Wolf ([id number]) Visit Report for 03/21/2020 Arrival Information Details Patient Name: Date of Service: [name], [name] 03/21/2020 3:30 PM Medical Record Number: [id number] Patient Account Number: [id number] Date of Birth/Sex: Treating RN: Dec 10, 1965 (55 y.o. Tonita Phoenix, Lauren Primary Care Lyly Canizales: PA Haig Prophet, NO Other Clinician: Referring Sade Mehlhoff: Treating Shardea Cwynar/Extender: Tonette Bihari, [name] in Treatment: 29 Visit Information History Since Last Visit All ordered tests and consults were completed: No Patient Arrived: Wheel Chair Added or deleted any medications: No Arrival Time: 16:09 Any new allergies or adverse reactions: No Accompanied Wolf: self Had a fall or experienced change in No Transfer Assistance: None activities of daily living that may affect Patient Identification Verified: Yes risk of falls: Secondary Verification Process Completed: Yes Signs or symptoms of abuse/neglect since last visito No Patient Requires Transmission-Based Precautions: No Hospitalized since last visit: Yes Patient Has Alerts: No Implantable device outside of the clinic excluding No cellular tissue based products placed in the center since last visit: Has Dressing in Place as Prescribed: Yes Has Compression in Place as Prescribed: Yes Pain Present Now: No Electronic Signature(s) Signed: 03/25/2020 5:23:06 PM Wolf: Rhae Hammock RN Entered Wolf: Rhae Hammock on 03/21/2020 16:09:55 -------------------------------------------------------------------------------- Encounter Discharge Information Details Patient Name: Date of Service: MA [name]. 03/21/2020 3:30 PM Medical Record Number: [id number] Patient Account Number: [id number] Date of Birth/Sex: Treating RN: 1965/05/25 (55 y.o. [name] Primary Care Tequan Redmon: PA Haig Prophet, [place] Other Clinician: Referring Irfan Veal: Treating Mehgan Santmyer/Extender: Tonette Bihari, [name] in  Treatment: 29 Encounter Discharge Information Items Post Procedure Vitals Discharge Condition: Stable Temperature (F): 98.4 Ambulatory Status: Wheelchair Pulse (bpm): 74 Discharge Destination: Home Respiratory Rate (breaths/min): 17 Transportation: Private Auto Blood Pressure (mmHg): 133/74 Accompanied Wolf: self Schedule Follow-up Appointment: Yes Clinical Summary of Care: Electronic Signature(s) Signed: 03/21/2020 5:28:19 PM Wolf: Deon Pilling Entered Wolf: Deon Pilling on 03/21/2020 17:20:04 -------------------------------------------------------------------------------- Lower Extremity Assessment Details Patient Name: Date of Service: Cheryl, Wolf 03/21/2020 3:30 PM Medical Record Number: [id number] Patient Account Number: [id number] Date of Birth/Sex: Treating RN: 10-29-1965 (56 y.o. Tonita Phoenix, Lauren Primary Care Nieves Barberi: PA Haig Prophet, [place] Other Clinician: Referring Thayne Cindric: Treating Makar Slatter/Extender: Tonette Bihari, [name] in Treatment: 29 Edema Assessment Assessed: [Left: No] [Right: No] Edema: [Left: Ye] [Right: s] Calf Left: Right: Point of Measurement: From Medial Instep 42 cm Ankle Left: Right: Point of Measurement: From Medial Instep 24 cm Vascular Assessment Pulses: Dorsalis Pedis Palpable: [Left:Yes] Posterior Tibial Palpable: [Left:Yes] Electronic Signature(s) Signed: 03/25/2020 5:23:06 PM Wolf: Rhae Hammock RN Entered Wolf: Rhae Hammock on 03/21/2020 16:14:59 -------------------------------------------------------------------------------- Multi Wound Chart Details Patient Name: Date of Service: MA [name]. 03/21/2020 3:30 PM Medical Record Number: [id number] Patient Account Number: [id number] Date of Birth/Sex: Treating RN: 1965/08/27 (55 y.o. [name] Primary Care Raylie Maddison: PA Haig Prophet, [place] Other Clinician: Referring Lancelot Alyea: Treating Kynley Metzger/Extender: Tonette Bihari, [name] in Treatment:  29 Vital Signs Height(in): 59 Pulse(bpm): 37 Weight(lbs): 200 Blood Pressure(mmHg): 133/74 Body Mass Index(BMI): 31 Temperature(F): 98.4 Respiratory Rate(breaths/min): 17 Photos: [1:No Photos Left, Proximal, Plantar Foot] [N/A:N/A N/A] Wound Location: [1:Blister] [N/A:N/A] Wounding Event: [1:Diabetic Wound/Ulcer of the Lower] [N/A:N/A] Primary Etiology: [1:Extremity Cataracts, Anemia, Congestive Heart] [N/A:N/A] Comorbid History: [1:Failure, Hypertension, Type II Diabetes, Osteomyelitis, Neuropathy 08/02/2019] [N/A:N/A] Date Acquired: [1:29] [N/A:N/A] Weeks of Treatment: [1:Open] [N/A:N/A] Wound Status: [1:0.3x0.6x0.1] [N/A:N/A] Measurements L x W x D (cm) [1:0.141] [N/A:N/A] A (cm) : rea [1:0.014] [N/A:N/A] Volume (cm) : [1:96.80%] [N/A:N/A] % Reduction in A rea: [1:99.90%] [N/A:N/A] % Reduction  in Volume: [1:Grade 2] [N/A:N/A] Classification: [1:Medium] [N/A:N/A] Exudate A mount: [1:Serosanguineous] [N/A:N/A] Exudate Type: [1:red, brown] [N/A:N/A] Exudate Color: [1:Well defined, not attached] [N/A:N/A] Wound Margin: [1:Large (67-100%)] [N/A:N/A] Granulation A mount: [1:Pink] [N/A:N/A] Granulation Quality: [1:None Present (0%)] [N/A:N/A] Necrotic A mount: [1:Fat Layer (Subcutaneous Tissue): Yes N/A] Exposed Structures: [1:Fascia: No Tendon: No Muscle: No Joint: No Bone: No Medium (34-66%)] [N/A:N/A] Epithelialization: [1:Debridement - Excisional] [N/A:N/A] Debridement: Pre-procedure Verification/Time Out 16:40 [N/A:N/A] Taken: [1:Other] [N/A:N/A] Pain Control: [1:Callus, Subcutaneous] [N/A:N/A] Tissue Debrided: [1:Skin/Subcutaneous Tissue] [N/A:N/A] Level: [1:0.4] [N/A:N/A] Debridement A (sq cm): [1:rea Curette] [N/A:N/A] Instrument: [1:Minimum] [N/A:N/A] Bleeding: [1:Pressure] [N/A:N/A] Hemostasis A chieved: [1:Procedure was tolerated well] [N/A:N/A] Debridement Treatment Response: [1:0.3x0.6x0.1] [N/A:N/A] Post Debridement Measurements L x W x D (cm) [1:0.014]  [N/A:N/A] Post Debridement Volume: (cm) [1:Debridement] [N/A:N/A] Treatment Notes Electronic Signature(s) Signed: 03/21/2020 5:12:39 PM Wolf: Baruch Gouty RN, [name] Signed: 03/21/2020 5:26:06 PM Wolf: Linton Ham MD Entered Wolf: Linton Ham on 03/21/2020 16:54:06 -------------------------------------------------------------------------------- Multi-Disciplinary Care Plan Details Patient Name: Date of Service: MA GERMAINE, RIPP 03/21/2020 3:30 PM Medical Record Number: [id number] Patient Account Number: [id number] Date of Birth/Sex: Treating RN: 05/04/1965 (55 y.o. [name] Primary Care Wane Mollett: PA Haig Prophet, [place] Other Clinician: Referring Ulyssa Walthour: Treating Lynton Crescenzo/Extender: Sonda Primes in Treatment: 29 Multidisciplinary Care Plan reviewed with physician Active Inactive Abuse / Safety / Falls / Self Care Management Nursing Diagnoses: Potential for falls Potential for injury related to falls Goals: Patient will not experience any injury related to falls Date Initiated: 08/30/2019 Target Resolution Date: 03/28/2020 Goal Status: Active Patient/caregiver will verbalize/demonstrate measures taken to prevent injury and/or falls Date Initiated: 08/30/2019 Date Inactivated: 09/27/2019 Target Resolution Date: 09/28/2019 Goal Status: Met Interventions: Assess Activities of Daily Living upon admission and as needed Assess fall risk on admission and as needed Assess: immobility, friction, shearing, incontinence upon admission and as needed Assess impairment of mobility on admission and as needed per policy Assess personal safety and home safety (as indicated) on admission and as needed Assess self care needs on admission and as needed Provide education on fall prevention Provide education on personal and home safety Notes: Nutrition Nursing Diagnoses: Impaired glucose control: actual or potential Potential for alteratiion in Nutrition/Potential for  imbalanced nutrition Goals: Patient/caregiver agrees to and verbalizes understanding of need to use nutritional supplements and/or vitamins as prescribed Date Initiated: 08/30/2019 Date Inactivated: 10/11/2019 Target Resolution Date: 10/05/2019 Goal Status: Met Patient/caregiver will maintain therapeutic glucose control Date Initiated: 08/30/2019 Target Resolution Date: 03/28/2020 Goal Status: Active Interventions: Assess HgA1c results as ordered upon admission and as needed Assess patient nutrition upon admission and as needed per policy Provide education on elevated blood sugars and impact on wound healing Provide education on nutrition Treatment Activities: Education provided on Nutrition : 09/11/2019 Notes: Wound/Skin Impairment Nursing Diagnoses: Impaired tissue integrity Knowledge deficit related to ulceration/compromised skin integrity Goals: Patient/caregiver will verbalize understanding of skin care regimen Date Initiated: 08/30/2019 Target Resolution Date: 04/18/2020 Goal Status: Active Interventions: Assess patient/caregiver ability to obtain necessary supplies Assess patient/caregiver ability to perform ulcer/skin care regimen upon admission and as needed Assess ulceration(s) every visit Provide education on ulcer and skin care Notes: Electronic Signature(s) Signed: 03/21/2020 4:30:54 PM Wolf: Lorrin Jackson Entered Wolf: Lorrin Jackson on 03/21/2020 16:30:53 -------------------------------------------------------------------------------- Pain Assessment Details Patient Name: Date of Service: MA LEYLI, KEVORKIAN 03/21/2020 3:30 PM Medical Record Number: [id number] Patient Account Number: [id number] Date of Birth/Sex: Treating RN: 1965-08-03 (55 y.o. Tonita Phoenix, Lauren Primary Care Aibhlinn Kalmar: PA Darnelle Spangle Other Clinician:  Referring Briceson Broadwater: Treating Kaoru Rezendes/Extender: Tonette Bihari, [name] in Treatment: 29 Active Problems Location of Pain Severity and  Description of Pain Patient Has Paino No Site Locations Rate the pain. Current Pain Level: 0 Pain Management and Medication Current Pain Management: Electronic Signature(s) Signed: 03/25/2020 5:23:06 PM Wolf: Rhae Hammock RN Entered Wolf: Rhae Hammock on 03/21/2020 16:09:33 -------------------------------------------------------------------------------- Patient/Caregiver Education Details Patient Name: Date of Service: MA [name] 3/18/2022andnbsp3:30 PM Medical Record Number: [id number] Patient Account Number: [id number] Date of Birth/Gender: Treating RN: 08-07-1965 (55 y.o. [name] Primary Care Physician: PA Haig Prophet, [place] Other Clinician: Referring Physician: Treating Physician/Extender: Sonda Primes in Treatment: 29 Education Assessment Education Provided To: Patient Education Topics Provided Elevated Blood Sugar/ Impact on Healing: Methods: Explain/Verbal Responses: State content correctly Venous: Methods: Explain/Verbal, Printed Responses: State content correctly Wound/Skin Impairment: Methods: Demonstration, Explain/Verbal, Printed Responses: State content correctly Electronic Signature(s) Signed: 03/21/2020 5:48:33 PM Wolf: Lorrin Jackson Entered Wolf: Lorrin Jackson on 03/21/2020 16:31:19 -------------------------------------------------------------------------------- Wound Assessment Details Patient Name: Date of Service: MA NOELE, ICENHOUR 03/21/2020 3:30 PM Medical Record Number: [id number] Patient Account Number: [id number] Date of Birth/Sex: Treating RN: [birth year] (55 y.o. Tonita Phoenix, Lauren Primary Care Aime Carreras: PA TIENT, NO Other Clinician: Referring Valerya Maxton: Treating Bracen Schum/Extender: Tonette Bihari, [name] in Treatment: 29 Wound Status Wound Number: 1 Primary Diabetic Wound/Ulcer of the Lower Extremity Etiology: Wound Location: Left, Proximal, Plantar Foot Wound Open Wounding Event:  Blister Status: Date Acquired: 08/02/2019 Comorbid Cataracts, Anemia, Congestive Heart Failure, Hypertension, Type Weeks Of Treatment: 29 History: II Diabetes, Osteomyelitis, Neuropathy Clustered Wound: No Photos Wound Measurements Length: (cm) 0.3 Width: (cm) 0.6 Depth: (cm) 0.1 Area: (cm) 0.141 Volume: (cm) 0.014 % Reduction in Area: 96.8% % Reduction in Volume: 99.9% Epithelialization: Medium (34-66%) Tunneling: No Undermining: No Wound Description Classification: Grade 2 Wound Margin: Well defined, not attached Exudate Amount: Medium Exudate Type: Serosanguineous Exudate Color: red, brown Foul Odor After Cleansing: No Slough/Fibrino No Wound Bed Granulation Amount: Large (67-100%) Exposed Structure Granulation Quality: Pink Fascia Exposed: No Necrotic Amount: None Present (0%) Fat Layer (Subcutaneous Tissue) Exposed: Yes Tendon Exposed: No Muscle Exposed: No Joint Exposed: No Bone Exposed: No Treatment Notes Wound #1 (Foot) Wound Laterality: Plantar, Left, Proximal Cleanser Peri-Wound Care Sween Lotion (Moisturizing lotion) Discharge Instruction: Apply moisturizing lotion or equivalent moisterizing lotion to foot and leg with dressing changes Topical Primary Dressing KerraCel Ag Gelling Fiber Dressing, 2x2 in (silver alginate) Discharge Instruction: Apply silver alginate to wound bed as instructed Secondary Dressing Woven Gauze Sponge, Non-Sterile 4x4 in Discharge Instruction: Apply over primary dressing as directed. Optifoam Non-Adhesive Dressing, 4x4 in Discharge Instruction: Foam to first met head for protection Secured With Compression Wrap Kerlix Roll 4.5x3.1 (in/yd) Discharge Instruction: Apply Kerlix and Coban compression as directed. Coban Self-Adherent Wrap 4x5 (in/yd) Discharge Instruction: Apply over Kerlix as directed. Compression Stockings Add-Ons Electronic Signature(s) Signed: 03/24/2020 7:47:40 AM Wolf: Sandre Kitty Signed: 03/25/2020  5:23:06 PM Wolf: Rhae Hammock RN Entered Wolf: Sandre Kitty on 03/23/2020 13:06:10 -------------------------------------------------------------------------------- Vitals Details Patient Name: Date of Service: MA LEE-ANNE, FLICKER 03/21/2020 3:30 PM Medical Record Number: [id number] Patient Account Number: [id number] Date of Birth/Sex: Treating RN: 1965-06-16 (55 y.o. Tonita Phoenix, Lauren Primary Care Hurbert Duran: PA TIENT, NO Other Clinician: Referring Jorel Gravlin: Treating Kimley Apsey/Extender: Tonette Bihari, [name] in Treatment: 29 Vital Signs Time Taken: 16:11 Temperature (F): 98.4 Height (in): 67 Pulse (bpm): 74 Weight (lbs): 200 Respiratory Rate (breaths/min): 17 Body Mass Index (BMI): 31.3 Blood Pressure (mmHg): 133/74 Reference  Range: 80 - 120 mg / dl Electronic Signature(s) Signed: 03/25/2020 5:23:06 PM Wolf: Rhae Hammock RN Entered Wolf: Rhae Hammock on 03/21/2020 16:09:27

## 2020-03-25 NOTE — Progress Notes (Signed)
CAITLEN, WORTH ([id number]) Visit Report for 03/21/2020 Debridement Details Patient Name: Date of Service: [name], [name] 03/21/2020 3:30 PM Medical Record Number: [id number] Patient Account Number: [id number] Date of Birth/Sex: Treating RN: 1965/09/09 (55 y.o. [name] Primary Care Provider: PA Haig Prophet, [place] Other Clinician: Referring Provider: Treating Provider/Extender: Tonette Bihari, [name] in Treatment: 29 Debridement Performed for Assessment: Wound #1 Left,Proximal,Plantar Foot Performed By: Physician Ricard Dillon., MD Debridement Type: Debridement Severity of Tissue Pre Debridement: Fat layer exposed Level of Consciousness (Pre-procedure): Awake and Alert Pre-procedure Verification/Time Out Yes - 16:40 Taken: Start Time: 16:41 Pain Control: Other : Benzocaine T Area Debrided (L x W): otal 0.4 (cm) x 1 (cm) = 0.4 (cm) Tissue and other material debrided: Non-Viable, Callus, Subcutaneous Level: Skin/Subcutaneous Tissue Debridement Description: Excisional Instrument: Curette Bleeding: Minimum Hemostasis Achieved: Pressure End Time: 16:46 Response to Treatment: Procedure was tolerated well Level of Consciousness (Post- Awake and Alert procedure): Post Debridement Measurements of Total Wound Length: (cm) 0.3 Width: (cm) 0.6 Depth: (cm) 0.1 Volume: (cm) 0.014 Character of Wound/Ulcer Post Debridement: Stable Severity of Tissue Post Debridement: Fat layer exposed Post Procedure Diagnosis Same as Pre-procedure Electronic Signature(s) Signed: 03/21/2020 5:12:39 PM By: Baruch Gouty RN, [name] Signed: 03/21/2020 5:26:06 PM By: Linton Ham MD Entered By: Linton Ham on 03/21/2020 16:54:28 -------------------------------------------------------------------------------- HPI Details Patient Name: Date of Service: [name]. 03/21/2020 3:30 PM Medical Record Number: [id number] Patient Account Number: [id number] Date of  Birth/Sex: Treating RN: 11-13-1965 (55 y.o. [name] Primary Care Provider: PA Haig Prophet, [place] Other Clinician: Referring Provider: Treating Provider/Extender: Tonette Bihari, [name] in Treatment: 29 History of Present Illness HPI Description: 55ADMISSION 08/30/2019 This is a 55 year old woman with type 2 diabetes and peripheral neuropathy. She developed a blister on the plantar left foot on 7/27. She was seen in the emergency room treated. This did not get any better and she required admission to the hospital from 08/02/2019 through 08/09/2019. She was felt to have cellulitis of the left foot. She was treated with IV medications/antibiotics discharged on Augmentin however she could not tolerate this and she was changed to doxycycline for 5 days she has completed this. She has advanced home care they are apparently putting Xeroform over the wound. When she left the hospital she was supposed to be putting Santyl over the black areas on the left plantar foot covered with Xeroform. X-rays done in the hospital did not show evidence of osteomyelitis only a superficial plantar ulceration. She had blood cultures but I do not see a wound culture. The patient has a past medical history of a right below-knee amputation by Dr. [name] in 2012 presumably for infection in this area although I am not certain. As noted she is a type II diabetic on oral agents with a recent hemoglobin A1c of 5.5. Other past medical history includes ITP, peripheral edema, chronic kidney disease stage III, hypertension, right below-knee amputation, diastolic heart failure. She tells me she is wheelchair-bound and is nonweightbearing although she does push the wheelchair along with the left foot. She is only using hospital sock on the left foot. ABI in our clinic was 1.06 on the left 9/7; the patient cultured Pseudomonas last week out of the deeper proximal wound. MRI that was ordered showed progressive soft tissue  ulceration along the plantar aspect of the foot especially along the medial arch but also plantar to the first metatarsal phalangeal joint no drainable abscess or foreign body. There was a suspicion  for early osteomyelitis in the fifth metatarsal base but she does not have a wound in this area. Also problematically is that where we put her prescription for ciprofloxacin and they did not have the antibiotic and we verified that they are out of ciprofloxacin. We did not receive the message from them to this effect The patient did see Dr. [name] of podiatry. The wound was debrided. He prescribed Santyl which I do not think she is picked up either. Lab work including a CBC sedimentation rate and C-reactive protein were ordered. Finally she is using silver alginate she has home health changing the dressing 3 times a week she does not disturb this in between times 9/23; we have not seen this lady in 2-1/2 weeks largely because of transportation issues. She still has 3 wound areas a deep area in the midfoot and area just above this and then an area on the first met head. She has been using silver alginate on the wound. They are trying to use home health however they are trying to teach her how to do this at be been very difficult for this patient to do so. In discussing things with her she uses this foot to propel her wheelchair I have asked her to stop doing this and to use her arms. She states that she is only moving around in her house. We will need to have a look at her lab work that I quoted from the last time she was here that was ordered by Dr. [name] of podiatry. By our MRI she does not have osteomyelitis in any of the wounded areas. 10/7 2-week follow-up. The patient has 3 wounds including superficially over the left first metatarsal head, left first submetatarsal in the lateral foot and a deep area more distally. Her MRI did not show osteomyelitis in reference to the wounds that we are treating  although did suggest an early possibility in the left fifth metatarsal head however there is no wound in this area. I have reviewed her inflammatory markers. Most recently on 7/26 she had a sedimentation rate of 117 although this was previously greater than 140 and then 108. Last CRP was on 8/20 at 5.1 comparing to previously at 4.27. Noteworthy that this is when she was in the hospital in July I believe. 10/19; patient missed her appointment last week because of transportation issues. We have been using silver collagen. She still has the area over the metatarsal head and the mid part of her foot which are superficial wounds about the same. Marked deterioration in the problematic distal wound. This now is right down into the fascial layer of her foot. Our intake nurse noted purulent drainage. She has a small satellite lesion towards the heel 10/28; PCR culture of the major wound she has on her proximal plantar foot grew both Peptostreptococcus and Corynebacterium. The Corynebacterium could easily be a skin contaminant but Peptostreptococcus deserve treatment and I started her on Augmentin she arrives in clinic today with the wound looking a lot better 11/5; Finishing her augmentin. Patient's foot is not doing well. Now with a probing area laterally of the major wound going laterally. she presents with a new wound on the lateral part of the left foot with purulent drainage. this tunnels widely and toward the plantar foot. It does not connect with the major plantar wound however I am concerned 11/12; Wound on the left lateral foot that had some purulent drainage last week I cultured and that was negative although she  was still on Augmentin at that time which may have affected the culture. Other than that she has the same 3 areas on the plantar part of the left foot including one over the third metatarsal head, a deep area proximally which has always been her largest wound and one in between. The one in  the third metatarsal head and one just distal to this of always been superficial where she has a deep wound in the proximal part of the foot. Today the lateral part of the deeper wound is not nearly ass easy to probe this had a considerable distance last week I told the patient that really the approach this is going to depend on the result of the MRI which is on Monday. If she has an underlying abscess and/or osteomyelitis she will need serious consideration of amputation in this patient who is nonambulatory plus or minus foot surgery and IV antibiotics. We really have not made a lot of progress with her original wounds. 12/3; surprisingly the patient's MRI did not show osteomyelitis but an extensive cellulitis soft tissue infection. With some difficulty 10 days ago we managed to get her on Levaquin and Flagyl. The patient arrives back in clinic today with her 3 original wounds. The area on the lateral foot has closed. 12/17; 2-week follow-up. She is completing her Levaquin should have another 2 days. No additional antibiotics are felt to be necessary. The tunneling area to the lateral part of her foot is closed over. Of the 3 wounds we have been dealing with the area over the met head is fully epithelialized. The middle area it is still eschared and the major distal area is heavily surrounded by thick callus 1/6; she completed her antibiotics has not been here in 3 weeks however. We have been using silver alginate. 2 remaining wounds on the plantar foot she is a diabetic. She does not have arterial issues. MRI did not show osteomyelitis but extensive cellulitis and soft tissue infection. 1/27; 3-week follow-up. She has home health who called [place] to report that her medications were mixed up she apparently has rectified the situation. We have been using silver collagen to the 2 remaining areas on her foot 2/11; the patient is down to the 1 wound most towards her plantar heel. The rest of this is closed.  She has been using silver collagen and changing the dressing herself 2/25; the area towards her plantar heel in the midfoot is much better much smaller. She has been using silver collagen she has home health coming up to change the dressing Monday Wednesday and she is seen here on Friday. Doing remarkably well 3/18; midfoot plantar wound. She has been using silver collagen. Came in with the area eschared and I thought she actually might be healed. She also tells [place] since she was last here she was hospitalized for low platelet count. She has I think chronic ITP Electronic Signature(s) Signed: 03/21/2020 5:26:06 PM By: Linton Ham MD Entered By: Linton Ham on 03/21/2020 16:55:34 -------------------------------------------------------------------------------- Physical Exam Details Patient Name: Date of Service: [name], [name] 03/21/2020 3:30 PM Medical Record Number: [id number] Patient Account Number: [id number] Date of Birth/Sex: Treating RN: 07-07-1965 (55 y.o. [name] Primary Care Provider: PA Haig Prophet, [place] Other Clinician: Referring Provider: Treating Provider/Extender: Tonette Bihari, [name] in Treatment: 29 Constitutional Sitting or standing Blood Pressure is within target range for patient.. Pulse regular and within target range for patient.Marland Kitchen Respirations regular, non-labored and within target range.. Temperature is normal  and within the target range for the patient.Marland Kitchen Appears in no distress. Notes Wound exam; patient's areas on the plantar midfoot. She had eschar and I thought this actually might be closed I used a #5 curette to explore this removing a lot of eschar and then medially I was finally able to find the open area. This looks healthy. Ultimately removed some subcutaneous debris to fully expose this. The wound is not large looks healthy. Hemostasis with direct pressure Electronic Signature(s) Signed: 03/21/2020 5:26:06 PM By: Linton Ham  MD Entered By: Linton Ham on 03/21/2020 16:57:00 -------------------------------------------------------------------------------- Physician Orders Details Patient Name: Date of Service: [name], [name] 03/21/2020 3:30 PM Medical Record Number: [id number] Patient Account Number: [id number] Date of Birth/Sex: Treating RN: 1965/09/03 (55 y.o. [name] Primary Care Provider: PA Haig Prophet, [place] Other Clinician: Referring Provider: Treating Provider/Extender: Tonette Bihari, [name] in Treatment: 29 Verbal / Phone Orders: No Diagnosis Coding ICD-10 Coding Code Description E11.621 Type 2 diabetes mellitus with foot ulcer L97.523 Non-pressure chronic ulcer of other part of left foot with necrosis of muscle Z89.511 Acquired absence of right leg below knee M86.672 Other chronic osteomyelitis, left ankle and foot Follow-up Appointments Return Appointment in 2 weeks. Bathing/ Shower/ Hygiene May shower with protection but do not get wound dressing(s) wet. Edema Control - Lymphedema / SCD / Other Left Lower Extremity Elevate legs to the level of the heart or above for 30 minutes daily and/or when sitting, a frequency of: - throughout the day Off-Loading Other: - minimal weight bearing left foot, try not to navigate wheelchair with left foot [place] wound care orders this week; continue Home Health for wound care. May utilize formulary equivalent dressing for wound treatment orders unless otherwise specified. - DC Prisma, start AG Alginate Dressing changes to be completed by [place] on Monday / Wednesday / Friday except when patient has scheduled visit at [facility]. Other Home Health Orders/Instructions: - Advanced Wound Treatment Wound #1 - Foot Wound Laterality: Plantar, Left, Proximal Peri-Wound Care: Sween Lotion (Moisturizing lotion) (Home Health) 3 x Per Week/30 Days Discharge Instructions: Apply moisturizing lotion or equivalent moisterizing  lotion to foot and leg with dressing changes Prim Dressing: KerraCel Ag Gelling Fiber Dressing, 2x2 in (silver alginate) ([place]) 3 x Per Week/30 Days ary Discharge Instructions: Apply silver alginate to wound bed as instructed Secondary Dressing: Woven Gauze Sponge, Non-Sterile 4x4 in (Home Health) 3 x Per Week/30 Days Discharge Instructions: Apply over primary dressing as directed. Secondary Dressing: Optifoam Non-Adhesive Dressing, 4x4 in (Home Health) 3 x Per Week/30 Days Discharge Instructions: Foam to first met head for protection Compression Wrap: Kerlix Roll 4.5x3.1 (in/yd) (Home Health) 3 x Per Week/30 Days Discharge Instructions: Apply Kerlix and Coban compression as directed. Compression Wrap: Coban Self-Adherent Wrap 4x5 (in/yd) (Home Health) 3 x Per Week/30 Days Discharge Instructions: Apply over Kerlix as directed. Electronic Signature(s) Signed: 03/21/2020 5:26:06 PM By: Linton Ham MD Signed: 03/21/2020 5:48:33 PM By: Lorrin Jackson Previous Signature: 03/21/2020 4:30:08 PM Version By: Lorrin Jackson Entered By: Lorrin Jackson on 03/21/2020 16:53:29 -------------------------------------------------------------------------------- Problem List Details Patient Name: Date of Service: [name], [name] 03/21/2020 3:30 PM Medical Record Number: [id number] Patient Account Number: [id number] Date of Birth/Sex: Treating RN: April 02, 1965 (55 y.o. [name] Primary Care Provider: PA Haig Prophet, [place] Other Clinician: Referring Provider: Treating Provider/Extender: Tonette Bihari, [name] in Treatment: 29 Active Problems ICD-10 Encounter Code Description Active Date MDM Diagnosis E11.621 Type 2 diabetes mellitus with foot  ulcer 08/30/2019 No Yes L97.523 Non-pressure chronic ulcer of other part of left foot with necrosis of muscle 08/30/2019 No Yes Z89.511 Acquired absence of right leg below knee 08/30/2019 No Yes M86.672 Other chronic osteomyelitis, left ankle  and foot 08/30/2019 No Yes Inactive Problems Resolved Problems Electronic Signature(s) Signed: 03/21/2020 5:26:06 PM By: Linton Ham MD Previous Signature: 03/21/2020 4:29:46 PM Version By: Lorrin Jackson Entered By: Linton Ham on 03/21/2020 16:53:56 -------------------------------------------------------------------------------- Progress Note Details Patient Name: Date of Service: [name] 03/21/2020 3:30 PM Medical Record Number: [id number] Patient Account Number: [id number] Date of Birth/Sex: Treating RN: 10-07-1965 (55 y.o. [name] Primary Care Provider: PA Haig Prophet, [place] Other Clinician: Referring Provider: Treating Provider/Extender: Tonette Bihari, [name] in Treatment: 29 Subjective History of Present Illness (HPI) 55ADMISSION 08/30/2019 This is a 55 year old woman with type 2 diabetes and peripheral neuropathy. She developed a blister on the plantar left foot on 7/27. She was seen in the emergency room treated. This did not get any better and she required admission to the hospital from 08/02/2019 through 08/09/2019. She was felt to have cellulitis of the left foot. She was treated with IV medications/antibiotics discharged on Augmentin however she could not tolerate this and she was changed to doxycycline for 5 days she has completed this. She has advanced home care they are apparently putting Xeroform over the wound. When she left the hospital she was supposed to be putting Santyl over the black areas on the left plantar foot covered with Xeroform. X-rays done in the hospital did not show evidence of osteomyelitis only a superficial plantar ulceration. She had blood cultures but I do not see a wound culture. The patient has a past medical history of a right below-knee amputation by Dr. [name] in 2012 presumably for infection in this area although I am not certain. As noted she is a type II diabetic on oral agents with a recent hemoglobin A1c of  5.5. Other past medical history includes ITP, peripheral edema, chronic kidney disease stage III, hypertension, right below-knee amputation, diastolic heart failure. She tells me she is wheelchair-bound and is nonweightbearing although she does push the wheelchair along with the left foot. She is only using hospital sock on the left foot. ABI in our clinic was 1.06 on the left 9/7; the patient cultured Pseudomonas last week out of the deeper proximal wound. MRI that was ordered showed progressive soft tissue ulceration along the plantar aspect of the foot especially along the medial arch but also plantar to the first metatarsal phalangeal joint no drainable abscess or foreign body. There was a suspicion for early osteomyelitis in the fifth metatarsal base but she does not have a wound in this area. Also problematically is that where we put her prescription for ciprofloxacin and they did not have the antibiotic and we verified that they are out of ciprofloxacin. We did not receive the message from them to this effect The patient did see Dr. [name] of podiatry. The wound was debrided. He prescribed Santyl which I do not think she is picked up either. Lab work including a CBC sedimentation rate and C-reactive protein were ordered. Finally she is using silver alginate she has home health changing the dressing 3 times a week she does not disturb this in between times 9/23; we have not seen this lady in 2-1/2 weeks largely because of transportation issues. She still has 3 wound areas a deep area in the midfoot and area just above this and then an  area on the first met head. She has been using silver alginate on the wound. They are trying to use home health however they are trying to teach her how to do this at be been very difficult for this patient to do so. In discussing things with her she uses this foot to propel her wheelchair I have asked her to stop doing this and to use her arms. She states that  she is only moving around in her house. We will need to have a look at her lab work that I quoted from the last time she was here that was ordered by Dr. [name] of podiatry. By our MRI she does not have osteomyelitis in any of the wounded areas. 10/7 2-week follow-up. The patient has 3 wounds including superficially over the left first metatarsal head, left first submetatarsal in the lateral foot and a deep area more distally. Her MRI did not show osteomyelitis in reference to the wounds that we are treating although did suggest an early possibility in the left fifth metatarsal head however there is no wound in this area. I have reviewed her inflammatory markers. Most recently on 7/26 she had a sedimentation rate of 117 although this was previously greater than 140 and then 108. Last CRP was on 8/20 at 5.1 comparing to previously at 4.27. Noteworthy that this is when she was in the hospital in July I believe. 10/19; patient missed her appointment last week because of transportation issues. We have been using silver collagen. She still has the area over the metatarsal head and the mid part of her foot which are superficial wounds about the same. Marked deterioration in the problematic distal wound. This now is right down into the fascial layer of her foot. Our intake nurse noted purulent drainage. She has a small satellite lesion towards the heel 10/28; PCR culture of the major wound she has on her proximal plantar foot grew both Peptostreptococcus and Corynebacterium. The Corynebacterium could easily be a skin contaminant but Peptostreptococcus deserve treatment and I started her on Augmentin she arrives in clinic today with the wound looking a lot better 11/5; Finishing her augmentin. Patient's foot is not doing well. Now with a probing area laterally of the major wound going laterally. she presents with a new wound on the lateral part of the left foot with purulent drainage. this tunnels widely  and toward the plantar foot. It does not connect with the major plantar wound however I am concerned 11/12; Wound on the left lateral foot that had some purulent drainage last week I cultured and that was negative although she was still on Augmentin at that time which may have affected the culture. Other than that she has the same 3 areas on the plantar part of the left foot including one over the third metatarsal head, a deep area proximally which has always been her largest wound and one in between. The one in the third metatarsal head and one just distal to this of always been superficial where she has a deep wound in the proximal part of the foot. Today the lateral part of the deeper wound is not nearly ass easy to probe this had a considerable distance last week I told the patient that really the approach this is going to depend on the result of the MRI which is on Monday. If she has an underlying abscess and/or osteomyelitis she will need serious consideration of amputation in this patient who is nonambulatory plus or minus foot  surgery and IV antibiotics. We really have not made a lot of progress with her original wounds. 12/3; surprisingly the patient's MRI did not show osteomyelitis but an extensive cellulitis soft tissue infection. With some difficulty 10 days ago we managed to get her on Levaquin and Flagyl. The patient arrives back in clinic today with her 3 original wounds. The area on the lateral foot has closed. 12/17; 2-week follow-up. She is completing her Levaquin should have another 2 days. No additional antibiotics are felt to be necessary. The tunneling area to the lateral part of her foot is closed over. Of the 3 wounds we have been dealing with the area over the met head is fully epithelialized. The middle area it is still eschared and the major distal area is heavily surrounded by thick callus 1/6; she completed her antibiotics has not been here in 3 weeks however. We have  been using silver alginate. 2 remaining wounds on the plantar foot she is a diabetic. She does not have arterial issues. MRI did not show osteomyelitis but extensive cellulitis and soft tissue infection. 1/27; 3-week follow-up. She has home health who called [place] to report that her medications were mixed up she apparently has rectified the situation. We have been using silver collagen to the 2 remaining areas on her foot 2/11; the patient is down to the 1 wound most towards her plantar heel. The rest of this is closed. She has been using silver collagen and changing the dressing herself 2/25; the area towards her plantar heel in the midfoot is much better much smaller. She has been using silver collagen she has home health coming up to change the dressing Monday Wednesday and she is seen here on Friday. Doing remarkably well 3/18; midfoot plantar wound. She has been using silver collagen. Came in with the area eschared and I thought she actually might be healed. She also tells [place] since she was last here she was hospitalized for low platelet count. She has I think chronic ITP Patient History Information obtained from Patient. Family History Cancer - Father, Diabetes - Mother,Siblings,Father,Child, Hypertension - Mother,Father. Social History Never smoker, Marital Status - Widowed, Alcohol Use - Rarely, Drug Use - No History, Caffeine Use - Rarely. Medical History Eyes Patient has history of Cataracts Hematologic/Lymphatic Patient has history of Anemia Cardiovascular Patient has history of Congestive Heart Failure, Hypertension Endocrine Patient has history of Type II Diabetes Integumentary (Skin) Denies history of History of Burn Musculoskeletal Patient has history of Osteomyelitis Neurologic Patient has history of Neuropathy Hospitalization/Surgery History - right transmet amputation. - right BKA. - brain surgery. - c-section. - IandD left plantar ulcer. - tubal ligation. - low platelets  March 17 20222. Medical A Surgical History Notes nd Cardiovascular stroke Integumentary (Skin) 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 Objective Constitutional Sitting or standing Blood Pressure is within target range for patient.. Pulse regular and within target range for patient.Marland Kitchen Respirations regular, non-labored and within target range.. Temperature is normal and within the target range for the patient.Marland Kitchen Appears in no distress. Vitals Time Taken: 4:11 PM, Height: 67 in, Weight: 200 lbs, BMI: 31.3, Temperature: 98.4 F, Pulse: 74 bpm, Respiratory Rate: 17 breaths/min, Blood Pressure: 133/74 mmHg. General Notes: Wound exam; patient's areas on the plantar midfoot. She had eschar and I thought this actually might be closed I used a #5 curette to explore this removing a lot of eschar and then medially I was finally able to find the open area. This looks healthy. Ultimately removed some  subcutaneous debris to fully expose this. The wound is not large looks healthy. Hemostasis with direct pressure Integumentary (Hair, Skin) Wound #1 status is Open. Original cause of wound was Blister. The date acquired was: 08/02/2019. The wound has been in treatment 29 weeks. The wound is located on the Left,Proximal,Plantar Foot. The wound measures 0.3cm length x 0.6cm width x 0.1cm depth; 0.141cm^2 area and 0.014cm^3 volume. There is Fat Layer (Subcutaneous Tissue) exposed. There is no tunneling or undermining noted. There is a medium amount of serosanguineous drainage noted. The wound margin is well defined and not attached to the wound base. There is large (67-100%) pink granulation within the wound bed. There is no necrotic tissue within the wound bed. 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 There was a Excisional Skin/Subcutaneous Tissue Debridement with a total area of 0.4 sq cm performed by Ricard Dillon., MD. With the following instrument(s): Curette to remove Non-Viable tissue/material. Material removed includes Callus and Subcutaneous Tissue and after achieving pain control using Other (Benzocaine). No specimens were taken. A time out was conducted at 16:40, prior to the start of the procedure. A Minimum amount of bleeding was controlled with Pressure. The procedure was tolerated well. Post Debridement Measurements: 0.3cm length x 0.6cm width x 0.1cm depth; 0.014cm^3 volume. Character of Wound/Ulcer Post Debridement is stable. Severity of Tissue Post Debridement is: Fat layer exposed. Post procedure Diagnosis Wound #1: Same as Pre-Procedure Plan Follow-up Appointments: Return Appointment in 2 weeks. Bathing/ Shower/ Hygiene: May shower with protection but do not get wound dressing(s) wet. Edema Control - Lymphedema / SCD / Other: Elevate legs to the level of the heart or above for 30 minutes daily and/or when sitting, a frequency of: - throughout the day Off-Loading: Other: - minimal weight bearing left foot, try not to navigate wheelchair with left foot Home Health: New wound care orders this week; continue Home Health for wound care. May utilize formulary equivalent dressing for wound treatment orders unless otherwise specified. - DC Prisma, start AG Alginate Dressing changes to be completed by [facility] on Monday / Wednesday / Friday except when patient has scheduled visit at [facility]. Other Home Health Orders/Instructions: - Advanced WOUND #1: - Foot Wound Laterality: Plantar, Left, Proximal Peri-Wound Care: Sween Lotion (Moisturizing lotion) (Home Health) 3 x Per  Week/30 Days Discharge Instructions: Apply moisturizing lotion or equivalent moisterizing lotion to foot and leg with dressing changes Prim Dressing: KerraCel Ag Gelling Fiber Dressing, 2x2 in (silver alginate) ([place]) 3 x Per Week/30 Days ary Discharge Instructions: Apply silver alginate to wound bed as instructed Secondary Dressing: Woven Gauze Sponge, Non-Sterile 4x4 in (Home Health) 3 x Per Week/30 Days Discharge Instructions: Apply over primary dressing as directed. Secondary Dressing: Optifoam Non-Adhesive Dressing, 4x4 in (Home Health) 3 x Per Week/30 Days Discharge Instructions: Foam to first met head for protection Com pression Wrap: Kerlix Roll 4.5x3.1 (in/yd) (Home Health) 3 x Per Week/30 Days Discharge Instructions: Apply Kerlix and Coban compression as directed. Com pression Wrap: Coban Self-Adherent Wrap 4x5 (in/yd) (Home Health) 3 x Per Week/30 Days Discharge Instructions: Apply over Kerlix as directed. 1. I change  the primary dressing to [facility]. 2. Caution pressure relief I think predominantly pushing her wheelchair with this remaining foot on the left Electronic Signature(s) Signed: 03/21/2020 5:26:06 PM By: Linton Ham MD Entered By: Linton Ham on 03/21/2020 16:58:28 -------------------------------------------------------------------------------- HxROS Details Patient Name: Date of Service: [name]. 03/21/2020 3:30 PM Medical Record Number: [id number] Patient Account Number: [id number] Date of Birth/Sex: Treating RN: 04/12/1965 (55 y.o. Benjaman Lobe Primary Care Provider: PA Haig Prophet, [place] Other Clinician: Referring Provider: Treating Provider/Extender: Tonette Bihari, [name] in Treatment: 29 Information Obtained From Patient Eyes Medical History: Positive for: Cataracts Hematologic/Lymphatic Medical History: Positive for: Anemia Cardiovascular Medical History: Positive for: Congestive Heart Failure;  Hypertension Past Medical History Notes: stroke Endocrine Medical History: Positive for: Type II Diabetes Time with diabetes: since year 2000 Treated with: Oral agents Integumentary (Skin) Medical History: Negative for: History of Burn Past Medical History Notes: 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 Musculoskeletal Medical History: Positive for: Osteomyelitis Neurologic Medical History: Positive for: Neuropathy HBO Extended History Items Eyes: Cataracts Immunizations Pneumococcal Vaccine: Received Pneumococcal Vaccination: No Implantable Devices No devices added Hospitalization / Surgery History Type of Hospitalization/Surgery right transmet amputation right BKA brain surgery c-section IandD left plantar ulcer tubal ligation low platelets March 17 20222 Family and Social History Cancer: Yes - Father; Diabetes: Yes - Mother,Siblings,Father,Child; Hypertension: Yes - Mother,Father; Never smoker; Marital Status - Widowed; Alcohol Use: Rarely; Drug Use: No History; Caffeine Use: Rarely; Financial Concerns: No; Food, Clothing or Shelter Needs: No; Support System Lacking: No; Transportation Concerns: No Electronic Signature(s) Signed: 03/21/2020 5:26:06 PM By: Linton Ham MD Signed: 03/25/2020 5:23:06 PM By: Rhae Hammock RN Entered By: Rhae Hammock on 03/21/2020 16:10:18 -------------------------------------------------------------------------------- SuperBill Details Patient Name: Date of Service: [name], [name] 03/21/2020 Medical Record Number: [id number] Patient Account Number: [id number] Date of Birth/Sex: Treating RN: 06/02/1965 (55 y.o. [name] Primary Care Provider: PA Haig Prophet, [place] Other Clinician: Referring Provider: Treating Provider/Extender: Tonette Bihari, [name] in Treatment: 29 Diagnosis Coding ICD-10 Codes Code Description E11.621 Type 2 diabetes mellitus with foot ulcer L97.523 Non-pressure chronic ulcer of other part  of left foot with necrosis of muscle Z89.511 Acquired absence of right leg below knee M86.672 Other chronic osteomyelitis, left ankle and foot Facility Procedures CPT4 Code: 51884166 Description: 06301 - DEB SUBQ TISSUE 20 SQ CM/< ICD-10 Diagnosis Description L97.523 Non-pressure chronic ulcer of other part of left foot with necrosis of muscle Modifier: Quantity: 1 Physician Procedures : CPT4 Code Description Modifier [id number] - WC PHYS SUBQ TISS 20 SQ CM ICD-10 Diagnosis Description L97.523 Non-pressure chronic ulcer of other part of left foot with necrosis of muscle Quantity: 1 Electronic Signature(s) Signed: 03/21/2020 5:26:06 PM By: Linton Ham MD Entered By: Linton Ham on 03/21/2020 16:58:44

## 2020-03-26 ENCOUNTER — Inpatient Hospital Stay: Payer: Medicare Other

## 2020-03-26 ENCOUNTER — Other Ambulatory Visit: Payer: Self-pay | Admitting: Pharmacist

## 2020-03-26 ENCOUNTER — Other Ambulatory Visit: Payer: Self-pay

## 2020-03-26 VITALS — BP 145/63 | HR 77 | Temp 98.2°F | Resp 18

## 2020-03-26 DIAGNOSIS — D693 Immune thrombocytopenic purpura: Secondary | ICD-10-CM

## 2020-03-26 DIAGNOSIS — Z7982 Long term (current) use of aspirin: Secondary | ICD-10-CM | POA: Diagnosis not present

## 2020-03-26 DIAGNOSIS — D696 Thrombocytopenia, unspecified: Secondary | ICD-10-CM

## 2020-03-26 DIAGNOSIS — Z79899 Other long term (current) drug therapy: Secondary | ICD-10-CM | POA: Diagnosis not present

## 2020-03-26 LAB — CMP (CANCER CENTER ONLY)
ALT: 21 U/L (ref 0–44)
AST: 38 U/L (ref 15–41)
Albumin: 3.9 g/dL (ref 3.5–5.0)
Alkaline Phosphatase: 84 U/L (ref 38–126)
Anion gap: 12 (ref 5–15)
BUN: 24 mg/dL — ABNORMAL HIGH (ref 6–20)
CO2: 22 mmol/L (ref 22–32)
Calcium: 8.9 mg/dL (ref 8.9–10.3)
Chloride: 105 mmol/L (ref 98–111)
Creatinine: 1.33 mg/dL — ABNORMAL HIGH (ref 0.44–1.00)
GFR, Estimated: 48 mL/min — ABNORMAL LOW (ref >60)
Glucose, Bld: 103 mg/dL — ABNORMAL HIGH (ref 70–99)
Potassium: 5.8 mmol/L — ABNORMAL HIGH (ref 3.5–5.1)
Sodium: 139 mmol/L (ref 135–145)
Total Bilirubin: 0.4 mg/dL (ref 0.3–1.2)
Total Protein: 8.6 g/dL — ABNORMAL HIGH (ref 6.5–8.1)

## 2020-03-26 LAB — CBC WITH DIFFERENTIAL (CANCER CENTER ONLY)
Abs Immature Granulocytes: 0.01 10*3/uL (ref 0.00–0.07)
Basophils Absolute: 0 10*3/uL (ref 0.0–0.1)
Basophils Relative: 1 %
Eosinophils Absolute: 0.1 10*3/uL (ref 0.0–0.5)
Eosinophils Relative: 1 %
HCT: 35.8 % — ABNORMAL LOW (ref 36.0–46.0)
Hemoglobin: 10.7 g/dL — ABNORMAL LOW (ref 12.0–15.0)
Immature Granulocytes: 0 %
Lymphocytes Relative: 21 %
Lymphs Abs: 0.8 10*3/uL (ref 0.7–4.0)
MCH: 28.3 pg (ref 26.0–34.0)
MCHC: 29.9 g/dL — ABNORMAL LOW (ref 30.0–36.0)
MCV: 94.7 fL (ref 80.0–100.0)
Monocytes Absolute: 0.4 10*3/uL (ref 0.1–1.0)
Monocytes Relative: 9 %
Neutro Abs: 2.6 10*3/uL (ref 1.7–7.7)
Neutrophils Relative %: 68 %
Platelet Count: 26 10*3/uL — ABNORMAL LOW (ref 150–400)
RBC: 3.78 MIL/uL — ABNORMAL LOW (ref 3.87–5.11)
RDW: 15.8 % — ABNORMAL HIGH (ref 11.5–15.5)
WBC Count: 3.9 10*3/uL — ABNORMAL LOW (ref 4.0–10.5)
nRBC: 0 % (ref 0.0–0.2)

## 2020-03-26 MED ORDER — ROMIPLOSTIM 250 MCG ~~LOC~~ SOLR
2.0000 ug/kg | Freq: Once | SUBCUTANEOUS | Status: AC
  Administered 2020-03-26: 200 ug via SUBCUTANEOUS
  Filled 2020-03-26: qty 0.4

## 2020-03-26 NOTE — Patient Instructions (Signed)
Romiplostim injection What is this medicine? ROMIPLOSTIM (roe mi PLOE stim) helps your body make more platelets. This medicine is used to treat low platelets caused by chronic idiopathic thrombocytopenic purpura (ITP) or a bone marrow syndrome caused by radiation sickness. This medicine may be used for other purposes; ask your health care provider or pharmacist if you have questions. COMMON BRAND NAME(S): Nplate What should I tell my health care provider before I take this medicine? They need to know if you have any of these conditions:  blood clots  myelodysplastic syndrome  an unusual or allergic reaction to romiplostim, mannitol, other medicines, foods, dyes, or preservatives  pregnant or trying to get pregnant  breast-feeding How should I use this medicine? This medicine is injected under the skin. It is given by a health care provider in a hospital or clinic setting. A special MedGuide will be given to you before each treatment. Be sure to read this information carefully each time. Talk to your health care provider about the use of this medicine in children. While it may be prescribed for children as young as newborns for selected conditions, precautions do apply. Overdosage: If you think you have taken too much of this medicine contact a poison control center or emergency room at once. NOTE: This medicine is only for you. Do not share this medicine with others. What if I miss a dose? Keep appointments for follow-up doses. It is important not to miss your dose. Call your health care provider if you are unable to keep an appointment. What may interact with this medicine? Interactions are not expected. This list may not describe all possible interactions. Give your health care provider a list of all the medicines, herbs, non-prescription drugs, or dietary supplements you use. Also tell them if you smoke, drink alcohol, or use illegal drugs. Some items may interact with your  medicine. What should I watch for while using this medicine? Visit your health care provider for regular checks on your progress. You may need blood work done while you are taking this medicine. Your condition will be monitored carefully while you are receiving this medicine. It is important not to miss any appointments. What side effects may I notice from receiving this medicine? Side effects that you should report to your doctor or health care professional as soon as possible:  allergic reactions (skin rash, itching or hives; swelling of the face, lips, or tongue)  bleeding (bloody or black, tarry stools; red or dark brown urine; spitting up blood or brown material that looks like coffee grounds; red spots on the skin; unusual bruising or bleeding from the eyes, gums, or nose)  blood clot (chest pain; shortness of breath; pain, swelling, or warmth in the leg)  stroke (changes in vision; confusion; trouble speaking or understanding; severe headaches; sudden numbness or weakness of the face, arm or leg; trouble walking; dizziness; loss of balance or coordination) Side effects that usually do not require medical attention (report to your doctor or health care professional if they continue or are bothersome):  diarrhea  dizziness  headache  joint pain  muscle pain  stomach pain  trouble sleeping This list may not describe all possible side effects. Call your doctor for medical advice about side effects. You may report side effects to FDA at 1-800-FDA-1088. Where should I keep my medicine? This medicine is given in a hospital or clinic. It will not be stored at home. NOTE: This sheet is a summary. It may not cover all possible   information. If you have questions about this medicine, talk to your doctor, pharmacist, or health care provider.  2021 Elsevier/Gold Standard (2019-02-05 10:28:13)  

## 2020-03-31 NOTE — Progress Notes (Incomplete)
Patient Care Team: Patient, No Pcp Per as PCP - General (General Practice)  DIAGNOSIS: No diagnosis found.  CHIEF COMPLIANT: Follow-up of ITP  INTERVAL HISTORY: [name] is a 55 y.o. with above-mentioned history of ITPpreviously treated withIVIG and currently on weekly Nplate treatment.Labs on 03/26/20 showed Hg 10.7, platelets 26. She presentsto the clinictodayforfollow-up.  ALLERGIES:  has No Known Allergies.  MEDICATIONS:  Current Outpatient Medications  Medication Sig Dispense Refill  . acetaminophen (TYLENOL) 500 MG tablet Take 1,000 mg by mouth as needed (pain).    Marland Kitchen amLODipine (NORVASC) 5 MG tablet Take 1 tablet (5 mg total) by mouth daily. 90 tablet 3  . aspirin EC 81 MG tablet Take 81 mg by mouth daily as needed for moderate pain. Swallow whole.    . Blood Glucose Monitoring Suppl (PRODIGY AUTOCODE BLOOD GLUCOSE) w/Device KIT 1 kit by Does not apply route in the morning, at noon, in the evening, and at bedtime. 1 kit 0  . gabapentin (NEURONTIN) 400 MG capsule Take 1 capsule (400 mg total) by mouth 2 (two) times daily. 180 capsule 3  . isosorbide mononitrate (IMDUR) 30 MG 24 hr tablet Take 1 tablet (30 mg total) by mouth daily. 90 tablet 3  . metFORMIN (GLUCOPHAGE) 500 MG tablet Take 1 tablet (500 mg total) by mouth 2 (two) times daily with a meal. 180 tablet 3  . metoprolol succinate (TOPROL-XL) 50 MG 24 hr tablet Take 1 tablet (50 mg total) by mouth daily. Take with or immediately following a meal. 90 tablet 3  . rosuvastatin (CRESTOR) 20 MG tablet Take 1 tablet (20 mg total) by mouth daily. 90 tablet 3   No current facility-administered medications for this visit.    PHYSICAL EXAMINATION: ECOG PERFORMANCE STATUS: {CHL ONC ECOG [phone]}  There were no vitals filed for this visit. There were no vitals filed for this visit.   LABORATORY DATA:  I have reviewed the data as listed CMP Latest Ref Rng & Units 03/26/2020 03/18/2020 03/11/2020  Glucose 70 -  99 mg/dL 103(H) 99 111(H)  BUN 6 - 20 mg/dL 24(H) 30(H) 44(H)  Creatinine 0.44 - 1.00 mg/dL 1.33(H) 0.93 1.23(H)  Sodium 135 - 145 mmol/L 139 136 136  Potassium 3.5 - 5.1 mmol/L 5.8(H) 4.9 4.6  Chloride 98 - 111 mmol/L 105 108 107  CO2 22 - 32 mmol/L '22 22 22  ' Calcium 8.9 - 10.3 mg/dL 8.9 8.5(L) 8.7(L)  Total Protein 6.5 - 8.1 g/dL 8.6(H) 8.2(H) 7.3  Total Bilirubin 0.3 - 1.2 mg/dL 0.4 0.5 0.6  Alkaline Phos 38 - 126 U/L 84 77 56  AST 15 - 41 U/L 38 26 18  ALT 0 - 44 U/L '21 24 9    ' Lab Results  Component Value Date   WBC 3.9 (L) 03/26/2020   HGB 10.7 (L) 03/26/2020   HCT 35.8 (L) 03/26/2020   MCV 94.7 03/26/2020   PLT 26 (L) 03/26/2020   NEUTROABS 2.6 03/26/2020    ASSESSMENT & PLAN:  No problem-specific Assessment & Plan notes found for this encounter.    No orders of the defined types were placed in this encounter.  The patient has a good understanding of the overall plan. she agrees with it. she will call with any problems that may develop before the next visit here.  Total time spent: *** mins including face to face time and time spent for planning, charting and coordination of care  Rulon Eisenmenger, MD, MPH 03/31/2020  I, Cloyde Reams Dorshimer,  am acting as scribe for Dr. Nicholas Lose.  {insert scribe attestation}

## 2020-03-31 NOTE — Assessment & Plan Note (Deleted)
Relapsed Refractory ITP: Relapsing ITP: Patient response to steroids and IVIG but because she is a diabetic we cannot use steroids.Normally responds to IV IG Prior IVIG: March 2021, April 2021, June 2021, Feb 2022 Lab review: Platelet count 16 on 05/16/2019 ----------------------------------------------------------------------------------------------------------------------------------------------------------------- Prior treatment:Rituxan weekly x4 started 05/23/2019 (patient missed her treatment on 05/30/2019)today is third treatment (patient missed multiple appointments)  Hospitalization: 06/25/2019-06/29/2019: Nausea and vomiting Hospitalization 08/02/2019-08/09/2019 cellulitis left foot Hospitalization 03/08/2020-03/11/2020: Relapsed ITP and renal failure: Improved with Nplate and IVIG  08/08/2019: Platelets 301, hemoglobin 8.6 09/05/2019: Platelets 110, hemoglobin 11.5 02/28/2020: Platelets 14, hemoglobin 10.1, creatinine 1.63, potassium 6.3 (this is after 2 doses of IVIG) 03/17/2020: Platelets 74, hemoglobin 9 03/26/20: Platelets 26 (Nplate inc to 2 mcg) 04/01/20: Platelets  Return to clinic weekly for Nplate treatments. I will see the patient in 2 weeks for follow-up.

## 2020-04-01 ENCOUNTER — Inpatient Hospital Stay: Payer: Medicare Other

## 2020-04-01 ENCOUNTER — Inpatient Hospital Stay: Payer: Medicare Other | Admitting: Hematology and Oncology

## 2020-04-01 DIAGNOSIS — D693 Immune thrombocytopenic purpura: Secondary | ICD-10-CM

## 2020-04-03 ENCOUNTER — Telehealth: Payer: Self-pay | Admitting: Hematology and Oncology

## 2020-04-03 NOTE — Telephone Encounter (Signed)
R/s appts per 3/30 inbasket msg. Called pt, no answer. Left msg with updated appts date and times.

## 2020-04-04 ENCOUNTER — Encounter (HOSPITAL_BASED_OUTPATIENT_CLINIC_OR_DEPARTMENT_OTHER): Payer: Medicare Other | Attending: Internal Medicine | Admitting: Internal Medicine

## 2020-04-04 ENCOUNTER — Other Ambulatory Visit: Payer: Self-pay

## 2020-04-04 DIAGNOSIS — N183 Chronic kidney disease, stage 3 unspecified: Secondary | ICD-10-CM | POA: Insufficient documentation

## 2020-04-04 DIAGNOSIS — I5032 Chronic diastolic (congestive) heart failure: Secondary | ICD-10-CM | POA: Diagnosis not present

## 2020-04-04 DIAGNOSIS — E1142 Type 2 diabetes mellitus with diabetic polyneuropathy: Secondary | ICD-10-CM | POA: Insufficient documentation

## 2020-04-04 DIAGNOSIS — L97523 Non-pressure chronic ulcer of other part of left foot with necrosis of muscle: Secondary | ICD-10-CM | POA: Insufficient documentation

## 2020-04-04 DIAGNOSIS — Z993 Dependence on wheelchair: Secondary | ICD-10-CM | POA: Diagnosis not present

## 2020-04-04 DIAGNOSIS — L97522 Non-pressure chronic ulcer of other part of left foot with fat layer exposed: Secondary | ICD-10-CM | POA: Diagnosis not present

## 2020-04-04 DIAGNOSIS — E1169 Type 2 diabetes mellitus with other specified complication: Secondary | ICD-10-CM | POA: Diagnosis not present

## 2020-04-04 DIAGNOSIS — E1122 Type 2 diabetes mellitus with diabetic chronic kidney disease: Secondary | ICD-10-CM | POA: Insufficient documentation

## 2020-04-04 DIAGNOSIS — E11621 Type 2 diabetes mellitus with foot ulcer: Secondary | ICD-10-CM | POA: Diagnosis not present

## 2020-04-04 DIAGNOSIS — M86672 Other chronic osteomyelitis, left ankle and foot: Secondary | ICD-10-CM | POA: Diagnosis not present

## 2020-04-04 DIAGNOSIS — Z89511 Acquired absence of right leg below knee: Secondary | ICD-10-CM | POA: Diagnosis not present

## 2020-04-04 DIAGNOSIS — I130 Hypertensive heart and chronic kidney disease with heart failure and stage 1 through stage 4 chronic kidney disease, or unspecified chronic kidney disease: Secondary | ICD-10-CM | POA: Insufficient documentation

## 2020-04-04 NOTE — Progress Notes (Signed)
[name], [name] ([id number]) Visit Report for 04/04/2020 Debridement Details Patient Name: Date of Service: [name], [name] 04/04/2020 3:00 PM Medical Record Number: [id number] Patient Account Number: [id number] Date of Birth/Sex: Treating RN: 1965-07-17 (55 y.o. [name] Primary Care Provider: PA Haig Prophet, [place] Other Clinician: Referring Provider: Treating Provider/Extender: Tonette Bihari, [name] in Treatment: 31 Debridement Performed for Assessment: Wound #1 Left,Proximal,Plantar Foot Performed By: Physician Ricard Dillon., MD Debridement Type: Debridement Severity of Tissue Pre Debridement: Fat layer exposed Level of Consciousness (Pre-procedure): Awake and Alert Pre-procedure Verification/Time Out Yes - 16:14 Taken: Start Time: 16:15 T Area Debrided (L x W): otal 0.2 (cm) x 0.2 (cm) = 0.04 (cm) Tissue and other material debrided: Non-Viable, Eschar Level: Non-Viable Tissue Debridement Description: Selective/Open Wound Instrument: Curette Bleeding: Minimum Hemostasis Achieved: Pressure End Time: 16:18 Response to Treatment: Procedure was tolerated well Level of Consciousness (Post- Awake and Alert procedure): Post Debridement Measurements of Total Wound Length: (cm) 0.2 Width: (cm) 0.2 Depth: (cm) 0.1 Volume: (cm) 0.003 Character of Wound/Ulcer Post Debridement: Improved Severity of Tissue Post Debridement: Fat layer exposed Post Procedure Diagnosis Same as Pre-procedure Electronic Signature(s) Signed: 04/04/2020 5:01:47 PM By: Linton Ham MD Signed: 04/04/2020 5:18:29 PM By: Lorrin Jackson Entered By: Linton Ham on 04/04/2020 16:53:49 -------------------------------------------------------------------------------- HPI Details Patient Name: Date of Service: Cheryl [name]. 04/04/2020 3:00 PM Medical Record Number: [id number] Patient Account Number: [id number] Date of Birth/Sex: Treating RN: 1965/01/11 (55 y.o. [name] Primary Care Provider: PA Haig Prophet, [place] Other Clinician: Referring Provider: Treating Provider/Extender: Tonette Bihari, [name] in Treatment: 31 History of Present Illness HPI Description: 28ADMISSION 08/30/2019 This is a 55 year old woman with type 2 diabetes and peripheral neuropathy. She developed a blister on the plantar left foot on 7/27. She was seen in the emergency room treated. This did not get any better and she required admission to the hospital from 08/02/2019 through 08/09/2019. She was felt to have cellulitis of the left foot. She was treated with IV medications/antibiotics discharged on Augmentin however she could not tolerate this and she was changed to doxycycline for 5 days she has completed this. She has advanced home care they are apparently putting Xeroform over the wound. When she left the hospital she was supposed to be putting Santyl over the black areas on the left plantar foot covered with Xeroform. X-rays done in the hospital did not show evidence of osteomyelitis only a superficial plantar ulceration. She had blood cultures but I do not see a wound culture. The patient has a past medical history of a right below-knee amputation by Dr. [name] in 2012 presumably for infection in this area although I am not certain. As noted she is a type II diabetic on oral agents with a recent hemoglobin A1c of 5.5. Other past medical history includes ITP, peripheral edema, chronic kidney disease stage III, hypertension, right below-knee amputation, diastolic heart failure. She tells me she is wheelchair-bound and is nonweightbearing although she does push the wheelchair along with the left foot. She is only using hospital sock on the left foot. ABI in our clinic was 1.06 on the left 9/7; the patient cultured Pseudomonas last week out of the deeper proximal wound. MRI that was ordered showed progressive soft tissue ulceration along the plantar aspect of the foot especially  along the medial arch but also plantar to the first metatarsal phalangeal joint no drainable abscess or foreign body. There was a suspicion for early osteomyelitis in the fifth metatarsal  base but she does not have a wound in this area. Also problematically is that where we put her prescription for ciprofloxacin and they did not have the antibiotic and we verified that they are out of ciprofloxacin. We did not receive the message from them to this effect The patient did see Dr. [name] of podiatry. The wound was debrided. He prescribed Santyl which I do not think she is picked up either. Lab work including a CBC sedimentation rate and C-reactive protein were ordered. Finally she is using silver alginate she has home health changing the dressing 3 times a week she does not disturb this in between times 9/23; we have not seen this lady in 2-1/2 weeks largely because of transportation issues. She still has 3 wound areas a deep area in the midfoot and area just above this and then an area on the first met head. She has been using silver alginate on the wound. They are trying to use home health however they are trying to teach her how to do this at be been very difficult for this patient to do so. In discussing things with her she uses this foot to propel her wheelchair I have asked her to stop doing this and to use her arms. She states that she is only moving around in her house. We will need to have a look at her lab work that I quoted from the last time she was here that was ordered by Dr. [name] of podiatry. By our MRI she does not have osteomyelitis in any of the wounded areas. 10/7 2-week follow-up. The patient has 3 wounds including superficially over the left first metatarsal head, left first submetatarsal in the lateral foot and a deep area more distally. Her MRI did not show osteomyelitis in reference to the wounds that we are treating although did suggest an early possibility in the left  fifth metatarsal head however there is no wound in this area. I have reviewed her inflammatory markers. Most recently on 7/26 she had a sedimentation rate of 117 although this was previously greater than 140 and then 108. Last CRP was on 8/20 at 5.1 comparing to previously at 4.27. Noteworthy that this is when she was in the hospital in July I believe. 10/19; patient missed her appointment last week because of transportation issues. We have been using silver collagen. She still has the area over the metatarsal head and the mid part of her foot which are superficial wounds about the same. Marked deterioration in the problematic distal wound. This now is right down into the fascial layer of her foot. Our intake nurse noted purulent drainage. She has a small satellite lesion towards the heel 10/28; PCR culture of the major wound she has on her proximal plantar foot grew both Peptostreptococcus and Corynebacterium. The Corynebacterium could easily be a skin contaminant but Peptostreptococcus deserve treatment and I started her on Augmentin she arrives in clinic today with the wound looking a lot better 11/5; Finishing her augmentin. Patient's foot is not doing well. Now with a probing area laterally of the major wound going laterally. she presents with a new wound on the lateral part of the left foot with purulent drainage. this tunnels widely and toward the plantar foot. It does not connect with the major plantar wound however I am concerned 11/12; Wound on the left lateral foot that had some purulent drainage last week I cultured and that was negative although she was still on Augmentin at that time  which may have affected the culture. Other than that she has the same 3 areas on the plantar part of the left foot including one over the third metatarsal head, a deep area proximally which has always been her largest wound and one in between. The one in the third metatarsal head and one just distal to this  of always been superficial where she has a deep wound in the proximal part of the foot. Today the lateral part of the deeper wound is not nearly ass easy to probe this had a considerable distance last week I told the patient that really the approach this is going to depend on the result of the MRI which is on Monday. If she has an underlying abscess and/or osteomyelitis she will need serious consideration of amputation in this patient who is nonambulatory plus or minus foot surgery and IV antibiotics. We really have not made a lot of progress with her original wounds. 12/3; surprisingly the patient's MRI did not show osteomyelitis but an extensive cellulitis soft tissue infection. With some difficulty 10 days ago we managed to get her on Levaquin and Flagyl. The patient arrives back in clinic today with her 3 original wounds. The area on the lateral foot has closed. 12/17; 2-week follow-up. She is completing her Levaquin should have another 2 days. No additional antibiotics are felt to be necessary. The tunneling area to the lateral part of her foot is closed over. Of the 3 wounds we have been dealing with the area over the met head is fully epithelialized. The middle area it is still eschared and the major distal area is heavily surrounded by thick callus 1/6; she completed her antibiotics has not been here in 3 weeks however. We have been using silver alginate. 2 remaining wounds on the plantar foot she is a diabetic. She does not have arterial issues. MRI did not show osteomyelitis but extensive cellulitis and soft tissue infection. 1/27; 3-week follow-up. She has home health who called [place] to report that her medications were mixed up she apparently has rectified the situation. We have been using silver collagen to the 2 remaining areas on her foot 2/11; the patient is down to the 1 wound most towards her plantar heel. The rest of this is closed. She has been using silver collagen and changing the  dressing herself 2/25; the area towards her plantar heel in the midfoot is much better much smaller. She has been using silver collagen she has home health coming up to change the dressing Monday Wednesday and she is seen here on Friday. Doing remarkably well 3/18; midfoot plantar wound. She has been using silver collagen. Came in with the area eschared and I thought she actually might be healed. She also tells [place] since she was last here she was hospitalized for low platelet count. 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 All that is remaining of the 3 large areas she had. She has been using silver collagen. Electronic Signature(s) Signed: 04/04/2020 5:01:47 PM By: Linton Ham MD Entered By: Linton Ham on 04/04/2020 16:54:16 -------------------------------------------------------------------------------- Physical Exam Details Patient Name: Date of Service: [name], [name] 04/04/2020 3:00 PM Medical Record Number: [id number] Patient Account Number: [id number] Date of Birth/Sex: Treating RN: 08-May-1965 (55 y.o. [name] Primary Care Provider: PA Haig Prophet, [place] Other Clinician: Referring Provider: Treating Provider/Extender: Tonette Bihari, [name] in Treatment: 31 Constitutional Sitting or standing Blood Pressure is within target range for patient.. Pulse regular and within  target range for patient.Marland Kitchen Respirations regular, non-labored and within target range.. Temperature is normal and within the target range for the patient.Marland Kitchen Appears in no distress. Notes Wound exam; plantar midfoot. Again eschar around a very tiny wound area. I remove this with a #3 curette I can at this point see any reason why this will not be epithelialized fully when she is here next week Electronic Signature(s) Signed: 04/04/2020 5:01:47 PM By: Linton Ham MD Entered By: Linton Ham on 04/04/2020  16:55:29 -------------------------------------------------------------------------------- Physician Orders Details Patient Name: Date of Service: Cheryl [name]. 04/04/2020 3:00 PM Medical Record Number: [id number] Patient Account Number: [id number] Date of Birth/Sex: Treating RN: 1965/06/20 (55 y.o. [name] Primary Care Provider: PA Haig Prophet, [place] Other Clinician: Referring Provider: Treating Provider/Extender: Tonette Bihari, [name] in Treatment: 31 Verbal / Phone Orders: No Diagnosis Coding ICD-10 Coding Code Description E11.621 Type 2 diabetes mellitus with foot ulcer L97.523 Non-pressure chronic ulcer of other part of left foot with necrosis of muscle Z89.511 Acquired absence of right leg below knee M86.672 Other chronic osteomyelitis, left ankle and foot Follow-up Appointments Return Appointment in 2 weeks. Bathing/ Shower/ Hygiene May shower with protection but do not get wound dressing(s) wet. Edema Control - Lymphedema / SCD / Other Left Lower Extremity Elevate legs to the level of the heart or above for 30 minutes daily and/or when sitting, a frequency of: - throughout the day Off-Loading Other: - minimal weight bearing left foot, try not to navigate wheelchair with left foot Home Health No change in wound care orders this week; continue Home Health for wound care. May utilize formulary equivalent dressing for wound treatment orders unless otherwise specified. Dressing changes to be completed by [place] on Monday / Wednesday / Friday except when patient has scheduled visit at [facility]. Other Home Health Orders/Instructions: - Advanced Wound Treatment Wound #1 - Foot Wound Laterality: Plantar, Left, Proximal Peri-Wound Care: Sween Lotion (Moisturizing lotion) (Home Health) 3 x Per Week/30 Days Discharge Instructions: Apply moisturizing lotion or equivalent moisterizing lotion to foot and leg with dressing changes Prim Dressing: KerraCel  Ag Gelling Fiber Dressing, 2x2 in (silver alginate) ([place]) 3 x Per Week/30 Days ary Discharge Instructions: Apply silver alginate to wound bed as instructed Secondary Dressing: Woven Gauze Sponge, Non-Sterile 4x4 in (Home Health) 3 x Per Week/30 Days Discharge Instructions: Apply over primary dressing as directed. Secondary Dressing: Optifoam Non-Adhesive Dressing, 4x4 in (Home Health) 3 x Per Week/30 Days Discharge Instructions: Foam to first met head for protection Compression Wrap: Kerlix Roll 4.5x3.1 (in/yd) (Home Health) 3 x Per Week/30 Days Discharge Instructions: Apply Kerlix and Coban compression as directed. Compression Wrap: Coban Self-Adherent Wrap 4x5 (in/yd) (Home Health) 3 x Per Week/30 Days Discharge Instructions: Apply over Kerlix as directed. Electronic Signature(s) Signed: 04/04/2020 5:01:47 PM By: Linton Ham MD Signed: 04/04/2020 5:18:29 PM By: Lorrin Jackson Previous Signature: 04/04/2020 3:58:58 PM Version By: Lorrin Jackson Entered By: Lorrin Jackson on 04/04/2020 16:20:14 -------------------------------------------------------------------------------- Problem List Details Patient Name: Date of Service: [name], [name] 04/04/2020 3:00 PM Medical Record Number: [id number] Patient Account Number: [id number] Date of Birth/Sex: Treating RN: [birth year] (55 y.o. [name] Primary Care Provider: PA Haig Prophet, [place] Other Clinician: Referring Provider: Treating Provider/Extender: Tonette Bihari, [name] in Treatment: 31 Active Problems ICD-10 Encounter Code Description Active Date MDM Diagnosis E11.621 Type 2 diabetes mellitus with foot ulcer 08/30/2019 No Yes L97.523 Non-pressure chronic ulcer of other part of left foot with necrosis of  muscle 08/30/2019 No Yes Z89.511 Acquired absence of right leg below knee 08/30/2019 No Yes M86.672 Other chronic osteomyelitis, left ankle and foot 08/30/2019 No Yes Inactive Problems Resolved  Problems Electronic Signature(s) Signed: 04/04/2020 5:01:47 PM By: Linton Ham MD Previous Signature: 04/04/2020 3:58:34 PM Version By: Lorrin Jackson Entered By: Linton Ham on 04/04/2020 16:53:31 -------------------------------------------------------------------------------- Progress Note Details Patient Name: Date of Service: Cheryl [name]. 04/04/2020 3:00 PM Medical Record Number: [id number] Patient Account Number: [id number] Date of Birth/Sex: Treating RN: 05-15-1965 (55 y.o. [name] Primary Care Provider: PA Haig Prophet, [place] Other Clinician: Referring Provider: Treating Provider/Extender: Tonette Bihari, [name] in Treatment: 31 Subjective History of Present Illness (HPI) 28ADMISSION 08/30/2019 This is a 55 year old woman with type 2 diabetes and peripheral neuropathy. She developed a blister on the plantar left foot on 7/27. She was seen in the emergency room treated. This did not get any better and she required admission to the hospital from 08/02/2019 through 08/09/2019. She was felt to have cellulitis of the left foot. She was treated with IV medications/antibiotics discharged on Augmentin however she could not tolerate this and she was changed to doxycycline for 5 days she has completed this. She has advanced home care they are apparently putting Xeroform over the wound. When she left the hospital she was supposed to be putting Santyl over the black areas on the left plantar foot covered with Xeroform. X-rays done in the hospital did not show evidence of osteomyelitis only a superficial plantar ulceration. She had blood cultures but I do not see a wound culture. The patient has a past medical history of a right below-knee amputation by Dr. [name] in 2012 presumably for infection in this area although I am not certain. As noted she is a type II diabetic on oral agents with a recent hemoglobin A1c of 5.5. Other past medical history includes ITP, peripheral  edema, chronic kidney disease stage III, hypertension, right below-knee amputation, diastolic heart failure. She tells me she is wheelchair-bound and is nonweightbearing although she does push the wheelchair along with the left foot. She is only using hospital sock on the left foot. ABI in our clinic was 1.06 on the left 9/7; the patient cultured Pseudomonas last week out of the deeper proximal wound. MRI that was ordered showed progressive soft tissue ulceration along the plantar aspect of the foot especially along the medial arch but also plantar to the first metatarsal phalangeal joint no drainable abscess or foreign body. There was a suspicion for early osteomyelitis in the fifth metatarsal base but she does not have a wound in this area. Also problematically is that where we put her prescription for ciprofloxacin and they did not have the antibiotic and we verified that they are out of ciprofloxacin. We did not receive the message from them to this effect The patient did see Dr. [name] of podiatry. The wound was debrided. He prescribed Santyl which I do not think she is picked up either. Lab work including a CBC sedimentation rate and C-reactive protein were ordered. Finally she is using silver alginate she has home health changing the dressing 3 times a week she does not disturb this in between times 9/23; we have not seen this lady in 2-1/2 weeks largely because of transportation issues. She still has 3 wound areas a deep area in the midfoot and area just above this and then an area on the first met head. She has been using silver alginate on the wound. They are  trying to use home health however they are trying to teach her how to do this at be been very difficult for this patient to do so. In discussing things with her she uses this foot to propel her wheelchair I have asked her to stop doing this and to use her arms. She states that she is only moving around in her house. We will need to  have a look at her lab work that I quoted from the last time she was here that was ordered by Dr. [name] of podiatry. By our MRI she does not have osteomyelitis in any of the wounded areas. 10/7 2-week follow-up. The patient has 3 wounds including superficially over the left first metatarsal head, left first submetatarsal in the lateral foot and a deep area more distally. Her MRI did not show osteomyelitis in reference to the wounds that we are treating although did suggest an early possibility in the left fifth metatarsal head however there is no wound in this area. I have reviewed her inflammatory markers. Most recently on 7/26 she had a sedimentation rate of 117 although this was previously greater than 140 and then 108. Last CRP was on 8/20 at 5.1 comparing to previously at 4.27. Noteworthy that this is when she was in the hospital in July I believe. 10/19; patient missed her appointment last week because of transportation issues. We have been using silver collagen. She still has the area over the metatarsal head and the mid part of her foot which are superficial wounds about the same. Marked deterioration in the problematic distal wound. This now is right down into the fascial layer of her foot. Our intake nurse noted purulent drainage. She has a small satellite lesion towards the heel 10/28; PCR culture of the major wound she has on her proximal plantar foot grew both Peptostreptococcus and Corynebacterium. The Corynebacterium could easily be a skin contaminant but Peptostreptococcus deserve treatment and I started her on Augmentin she arrives in clinic today with the wound looking a lot better 11/5; Finishing her augmentin. Patient's foot is not doing well. Now with a probing area laterally of the major wound going laterally. she presents with a new wound on the lateral part of the left foot with purulent drainage. this tunnels widely and toward the plantar foot. It does not connect with the  major plantar wound however I am concerned 11/12; Wound on the left lateral foot that had some purulent drainage last week I cultured and that was negative although she was still on Augmentin at that time which may have affected the culture. Other than that she has the same 3 areas on the plantar part of the left foot including one over the third metatarsal head, a deep area proximally which has always been her largest wound and one in between. The one in the third metatarsal head and one just distal to this of always been superficial where she has a deep wound in the proximal part of the foot. Today the lateral part of the deeper wound is not nearly ass easy to probe this had a considerable distance last week I told the patient that really the approach this is going to depend on the result of the MRI which is on Monday. If she has an underlying abscess and/or osteomyelitis she will need serious consideration of amputation in this patient who is nonambulatory plus or minus foot surgery and IV antibiotics. We really have not made a lot of progress with her original wounds.  12/3; surprisingly the patient's MRI did not show osteomyelitis but an extensive cellulitis soft tissue infection. With some difficulty 10 days ago we managed to get her on Levaquin and Flagyl. The patient arrives back in clinic today with her 3 original wounds. The area on the lateral foot has closed. 12/17; 2-week follow-up. She is completing her Levaquin should have another 2 days. No additional antibiotics are felt to be necessary. The tunneling area to the lateral part of her foot is closed over. Of the 3 wounds we have been dealing with the area over the met head is fully epithelialized. The middle area it is still eschared and the major distal area is heavily surrounded by thick callus 1/6; she completed her antibiotics has not been here in 3 weeks however. We have been using silver alginate. 2 remaining wounds on the plantar  foot she is a diabetic. She does not have arterial issues. MRI did not show osteomyelitis but extensive cellulitis and soft tissue infection. 1/27; 3-week follow-up. She has home health who called [place] to report that her medications were mixed up she apparently has rectified the situation. We have been using silver collagen to the 2 remaining areas on her foot 2/11; the patient is down to the 1 wound most towards her plantar heel. The rest of this is closed. She has been using silver collagen and changing the dressing herself 2/25; the area towards her plantar heel in the midfoot is much better much smaller. She has been using silver collagen she has home health coming up to change the dressing Monday Wednesday and she is seen here on Friday. Doing remarkably well 3/18; midfoot plantar wound. She has been using silver collagen. Came in with the area eschared and I thought she actually might be healed. She also tells [place] since she was last here she was hospitalized for low platelet count. 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 All that is remaining of the 3 large areas she had. She has been using silver collagen. Objective Constitutional Sitting or standing Blood Pressure is within target range for patient.. Pulse regular and within target range for patient.Marland Kitchen Respirations regular, non-labored and within target range.. Temperature is normal and within the target range for the patient.Marland Kitchen Appears in no distress. Vitals Time Taken: 3:46 PM, Height: 67 in, Weight: 200 lbs, BMI: 31.3, Temperature: 98.1 F, Pulse: 90 bpm, Respiratory Rate: 17 breaths/min, Blood Pressure: 138/75 mmHg. General Notes: Wound exam; plantar midfoot. Again eschar around a very tiny wound area. I remove this with a #3 curette I can at this point see any reason why this will not be epithelialized fully when she is here next week Integumentary (Hair, Skin) Wound #1 status is Open. Original cause of wound was Blister.  The date acquired was: 08/02/2019. The wound has been in treatment 31 weeks. The wound is located on the Left,Proximal,Plantar Foot. The wound measures 0.2cm length x 0.2cm width x 0.1cm depth; 0.031cm^2 area and 0.003cm^3 volume. There is Fat Layer (Subcutaneous Tissue) exposed. There is a medium amount of serosanguineous drainage noted. The wound margin is well defined and not attached to the wound base. There is large (67-100%) pink granulation within the wound bed. There is no necrotic tissue within the wound bed. 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 There was a Selective/Open Wound Non-Viable Tissue Debridement with a total area of 0.04 sq cm performed by Ricard Dillon., MD. With the following instrument(s): Curette to remove Non-Viable tissue/material. Material removed includes Eschar. No specimens were taken. A time out was conducted at 16:14, prior to the start of the procedure. A Minimum amount of bleeding was controlled with Pressure. The procedure was tolerated well. Post Debridement Measurements: 0.2cm length x 0.2cm width x 0.1cm depth; 0.003cm^3 volume. Character of Wound/Ulcer Post Debridement is improved. Severity of Tissue Post Debridement is: Fat layer exposed. Post procedure Diagnosis Wound #1: Same as Pre-Procedure Plan Follow-up Appointments: Return Appointment in 2 weeks. Bathing/ Shower/ Hygiene: May shower with protection but do not get wound dressing(s) wet. Edema Control - Lymphedema / SCD / Other: Elevate legs to the level of the heart or above for 30  minutes daily and/or when sitting, a frequency of: - throughout the day Off-Loading: Other: - minimal weight bearing left foot, try not to navigate wheelchair with left foot Home Health: No change in wound care orders this week; continue Home Health for wound care. May utilize formulary equivalent dressing for wound treatment orders unless otherwise specified. Dressing changes to be completed by [place] on Monday / Wednesday / Friday except when patient has scheduled visit at [facility]. Other Home Health Orders/Instructions: - Advanced WOUND #1: - Foot Wound Laterality: Plantar, Left, Proximal Peri-Wound Care: Sween Lotion (Moisturizing lotion) (Home Health) 3 x Per Week/30 Days Discharge Instructions: Apply moisturizing lotion or equivalent moisterizing lotion to foot and leg with dressing changes Prim Dressing: KerraCel Ag Gelling Fiber Dressing, 2x2 in (silver alginate) ([place]) 3 x Per Week/30 Days ary Discharge Instructions: Apply silver alginate to wound bed as instructed Secondary Dressing: Woven Gauze Sponge, Non-Sterile 4x4 in (Home Health) 3 x Per Week/30 Days Discharge Instructions: Apply over primary dressing as directed. Secondary Dressing: Optifoam Non-Adhesive Dressing, 4x4 in (Home Health) 3 x Per Week/30 Days Discharge Instructions: Foam to first met head for protection Compression Wrap: Kerlix Roll 4.5x3.1 (in/yd) (Home Health) 3 x Per Week/30 Days Discharge Instructions: Apply Kerlix and Coban compression as directed. Compression Wrap: Coban Self-Adherent Wrap 4x5 (in/yd) (Home Health) 3 x Per Week/30 Days Discharge Instructions: Apply over Kerlix as directed. 1. We are going to use silver alginate 2. OPTi foam nonadhesive dressing Electronic Signature(s) Signed: 04/04/2020 5:01:47 PM By: Linton Ham MD Entered By: Linton Ham on 04/04/2020 16:56:14 -------------------------------------------------------------------------------- SuperBill  Details Patient Name: Date of Service: Cheryl [name] 04/04/2020 Medical Record Number: [id number] Patient Account Number: [id number] Date of Birth/Sex: Treating RN: 29-Apr-1965 (55 y.o. [name] Primary Care Provider: PA Haig Prophet, [place] Other Clinician: Referring Provider: Treating Provider/Extender: Tonette Bihari, [name] in Treatment: 31 Diagnosis Coding ICD-10 Codes Code Description E11.621 Type 2 diabetes mellitus with foot ulcer L97.523 Non-pressure chronic ulcer of other part of left foot with necrosis of muscle Z89.511 Acquired absence of right leg below knee M86.672 Other chronic osteomyelitis, left ankle and foot Facility Procedures CPT4 Code: [id number] Description: 06269 - DEBRIDE WOUND 1ST 20 SQ CM OR < ICD-10 Diagnosis Description L97.523 Non-pressure chronic ulcer of other part of left foot with necrosis of muscle E11.621 Type 2 diabetes mellitus with foot ulcer Modifier: Quantity: 1 Physician Procedures : CPT4 Code Description Modifier [id number] - WC  PHYS DEBR WO ANESTH 20 SQ CM ICD-10 Diagnosis Description L97.523 Non-pressure chronic ulcer of other part of left foot with necrosis of muscle E11.621 Type 2 diabetes mellitus with foot ulcer Quantity: 1 Electronic Signature(s) Signed: 04/04/2020 5:01:47 PM By: Linton Ham MD Entered By: Linton Ham on 04/04/2020 16:56:24

## 2020-04-07 DIAGNOSIS — E1165 Type 2 diabetes mellitus with hyperglycemia: Secondary | ICD-10-CM | POA: Diagnosis not present

## 2020-04-07 DIAGNOSIS — I10 Essential (primary) hypertension: Secondary | ICD-10-CM | POA: Diagnosis not present

## 2020-04-07 DIAGNOSIS — D6959 Other secondary thrombocytopenia: Secondary | ICD-10-CM | POA: Diagnosis not present

## 2020-04-07 DIAGNOSIS — I509 Heart failure, unspecified: Secondary | ICD-10-CM | POA: Diagnosis not present

## 2020-04-07 NOTE — Progress Notes (Signed)
THULA, STEWART ([id number]) Visit Report for 04/04/2020 Arrival Information Details Patient Name: Date of Service: MA MCKENLEY, BIRENBAUM 04/04/2020 3:00 PM Medical Record Number: [id number] Patient Account Number: [id number] Date of Birth/Sex: Treating RN: Dec 09, 1965 (55 y.o. [name] Primary Care Rana Adorno: PA Haig Prophet, [place] Other Clinician: Referring Reuel Lamadrid: Treating Fredick Schlosser/Extender: Tonette Bihari, [name] in Treatment: 60 Visit Information History Since Last Visit Added or deleted any medications: No Patient Arrived: Wheel Chair Any new allergies or adverse reactions: No Arrival Time: 15:46 Had a fall or experienced change in No Accompanied By: self activities of daily living that may affect Transfer Assistance: None risk of falls: Patient Identification Verified: Yes Signs or symptoms of abuse/neglect since last visito No Secondary Verification Process Completed: Yes Hospitalized since last visit: No Patient Requires Transmission-Based Precautions: No Implantable device outside of the clinic excluding No Patient Has Alerts: No cellular tissue based products placed in the center since last visit: Has Dressing in Place as Prescribed: Yes Pain Present Now: No Electronic Signature(s) Signed: 04/07/2020 10:23:55 AM By: Sandre Kitty Entered By: Sandre Kitty on 04/04/2020 15:46:27 -------------------------------------------------------------------------------- Encounter Discharge Information Details Patient Name: Date of Service: MA [name]. 04/04/2020 3:00 PM Medical Record Number: [id number] Patient Account Number: [id number] Date of Birth/Sex: Treating RN: [birth year] (55 y.o. Tonita Phoenix, Lauren Primary Care Brentley Horrell: PA Haig Prophet, [place] Other Clinician: Referring Zuha Dejonge: Treating Jeanann Balinski/Extender: Tonette Bihari, [name] in Treatment: 31 Encounter Discharge Information Items Post Procedure Vitals Discharge Condition:  Stable Temperature (F): 98.1 Ambulatory Status: Wheelchair Pulse (bpm): 90 Discharge Destination: Home Respiratory Rate (breaths/min): 17 Transportation: Private Auto Blood Pressure (mmHg): 138/75 Accompanied By: self Schedule Follow-up Appointment: Yes Clinical Summary of Care: Patient Declined Electronic Signature(s) Signed: 04/04/2020 5:01:26 PM By: Rhae Hammock RN Entered By: Rhae Hammock on 04/04/2020 17:01:06 -------------------------------------------------------------------------------- Multi Wound Chart Details Patient Name: Date of Service: MA [name]. 04/04/2020 3:00 PM Medical Record Number: [id number] Patient Account Number: [id number] Date of Birth/Sex: Treating RN: 1965-09-04 (55 y.o. [name] Primary Care Samanthia Howland: PA Haig Prophet, [place] Other Clinician: Referring Jaeger Trueheart: Treating Sholom Dulude/Extender: Tonette Bihari, [name] in Treatment: 31 Vital Signs Height(in): 67 Pulse(bpm): 90 Weight(lbs): 200 Blood Pressure(mmHg): 138/75 Body Mass Index(BMI): 31 Temperature(F): 98.1 Respiratory Rate(breaths/min): 17 Photos: [N/A:N/A] Left, Proximal, Plantar Foot N/A N/A Wound Location: Blister N/A N/A Wounding Event: Diabetic Wound/Ulcer of the Lower N/A N/A Primary Etiology: Extremity Cataracts, Anemia, Congestive Heart N/A N/A Comorbid History: Failure, Hypertension, Type II Diabetes, Osteomyelitis, Neuropathy 08/02/2019 N/A N/A Date Acquired: 31 N/A N/A Weeks of Treatment: Open N/A N/A Wound Status: 0.2x0.2x0.1 N/A N/A Measurements L x W x D (cm) 0.031 N/A N/A A (cm) : rea 0.003 N/A N/A Volume (cm) : 99.30% N/A N/A % Reduction in A rea: 100.00% N/A N/A % Reduction in Volume: Grade 2 N/A N/A Classification: Medium N/A N/A Exudate A mount: Serosanguineous N/A N/A Exudate Type: red, brown N/A N/A Exudate Color: Well defined, not attached N/A N/A Wound Margin: Large (67-100%) N/A N/A Granulation A  mount: Pink N/A N/A Granulation Quality: None Present (0%) N/A N/A Necrotic A mount: Fat Layer (Subcutaneous Tissue): Yes N/A N/A Exposed Structures: Fascia: No Tendon: No Muscle: No Joint: No Bone: No Medium (34-66%) N/A N/A Epithelialization: Debridement - Selective/Open Wound N/A N/A Debridement: Pre-procedure Verification/Time Out 16:14 N/A N/A Taken: Necrotic/Eschar N/A N/A Tissue Debrided: Non-Viable Tissue N/A N/A Level: 0.04 N/A N/A Debridement A (sq cm): rea Curette N/A N/A Instrument: Minimum N/A N/A Bleeding: Pressure N/A N/A Hemostasis A  chieved: Procedure was tolerated well N/A N/A Debridement Treatment Response: 0.2x0.2x0.1 N/A N/A Post Debridement Measurements L x W x D (cm) 0.003 N/A N/A Post Debridement Volume: (cm) Debridement N/A N/A Procedures Performed: Treatment Notes Electronic Signature(s) Signed: 04/04/2020 5:01:47 PM By: Linton Ham MD Signed: 04/04/2020 5:18:29 PM By: Lorrin Jackson Entered By: Linton Ham on 04/04/2020 16:53:38 -------------------------------------------------------------------------------- Multi-Disciplinary Care Plan Details Patient Name: Date of Service: MA [name]. 04/04/2020 3:00 PM Medical Record Number: [id number] Patient Account Number: [id number] Date of Birth/Sex: Treating RN: [birth year] (55 y.o. [name] Primary Care Sister Carbone: PA Haig Prophet, [place] Other Clinician: Referring Anamari Galeas: Treating Abelina Ketron/Extender: Sonda Primes in Treatment: 31 Multidisciplinary Care Plan reviewed with physician Active Inactive Abuse / Safety / Falls / Self Care Management Nursing Diagnoses: Potential for falls Potential for injury related to falls Goals: Patient will not experience any injury related to falls Date Initiated: 08/30/2019 Target Resolution Date: 05/02/2020 Goal Status: Active Patient/caregiver will verbalize/demonstrate measures taken to prevent injury and/or  falls Date Initiated: 08/30/2019 Date Inactivated: 09/27/2019 Target Resolution Date: 09/28/2019 Goal Status: Met Interventions: Assess Activities of Daily Living upon admission and as needed Assess fall risk on admission and as needed Assess: immobility, friction, shearing, incontinence upon admission and as needed Assess impairment of mobility on admission and as needed per policy Assess personal safety and home safety (as indicated) on admission and as needed Assess self care needs on admission and as needed Provide education on fall prevention Provide education on personal and home safety Notes: Nutrition Nursing Diagnoses: Impaired glucose control: actual or potential Potential for alteratiion in Nutrition/Potential for imbalanced nutrition Goals: Patient/caregiver agrees to and verbalizes understanding of need to use nutritional supplements and/or vitamins as prescribed Date Initiated: 08/30/2019 Date Inactivated: 10/11/2019 Target Resolution Date: 10/05/2019 Goal Status: Met Patient/caregiver will maintain therapeutic glucose control Date Initiated: 08/30/2019 Target Resolution Date: 05/02/2020 Goal Status: Active Interventions: Assess HgA1c results as ordered upon admission and as needed Assess patient nutrition upon admission and as needed per policy Provide education on elevated blood sugars and impact on wound healing Provide education on nutrition Treatment Activities: Education provided on Nutrition : 08/30/2019 Notes: Wound/Skin Impairment Nursing Diagnoses: Impaired tissue integrity Knowledge deficit related to ulceration/compromised skin integrity Goals: Patient/caregiver will verbalize understanding of skin care regimen Date Initiated: 08/30/2019 Target Resolution Date: 04/18/2020 Goal Status: Active Interventions: Assess patient/caregiver ability to obtain necessary supplies Assess patient/caregiver ability to perform ulcer/skin care regimen upon admission and  as needed Assess ulceration(s) every visit Provide education on ulcer and skin care Notes: Electronic Signature(s) Signed: 04/04/2020 3:59:19 PM By: Lorrin Jackson Entered By: Lorrin Jackson on 04/04/2020 15:59:19 -------------------------------------------------------------------------------- Pain Assessment Details Patient Name: Date of Service: MA IRVING, LUBBERS 04/04/2020 3:00 PM Medical Record Number: [id number] Patient Account Number: [id number] Date of Birth/Sex: Treating RN: Oct 14, 1965 (55 y.o. [name] Primary Care Mireille Lacombe: PA Haig Prophet, [place] Other Clinician: Referring Kataleyah Carducci: Treating Anastasia Tompson/Extender: Tonette Bihari, [name] in Treatment: 31 Active Problems Location of Pain Severity and Description of Pain Patient Has Paino No Site Locations Pain Management and Medication Current Pain Management: Electronic Signature(s) Signed: 04/04/2020 5:18:29 PM By: Lorrin Jackson Signed: 04/07/2020 10:23:55 AM By: Sandre Kitty Entered By: Sandre Kitty on 04/04/2020 15:46:51 -------------------------------------------------------------------------------- Patient/Caregiver Education Details Patient Name: Date of Service: MA [name] 4/1/2022andnbsp3:00 PM Medical Record Number: [id number] Patient Account Number: [id number] Date of Birth/Gender: Treating RN: 11/10/1965 (55 y.o. [name] Primary Care Physician: PA Haig Prophet, [place] Other Clinician: Referring Physician: Treating  Physician/Extender: Sonda Primes in Treatment: 31 Education Assessment Education Provided To: Patient Education Topics Provided Elevated Blood Sugar/ Impact on Healing: Methods: Explain/Verbal, Printed Responses: State content correctly Venous: Methods: Explain/Verbal, Printed Responses: State content correctly Wound/Skin Impairment: Methods: Demonstration, Explain/Verbal, Printed Responses: State content correctly Electronic  Signature(s) Signed: 04/04/2020 5:18:29 PM By: Lorrin Jackson Entered By: Lorrin Jackson on 04/04/2020 16:00:07 -------------------------------------------------------------------------------- Wound Assessment Details Patient Name: Date of Service: MA ASYAH, CANDLER 04/04/2020 3:00 PM Medical Record Number: [id number] Patient Account Number: [id number] Date of Birth/Sex: Treating RN: 09-29-1965 (55 y.o. [name] Primary Care Ifeanyi Mickelson: PA Haig Prophet, [place] Other Clinician: Referring Deyra Perdomo: Treating Mailee Klaas/Extender: Tonette Bihari, [name] in Treatment: 31 Wound Status Wound Number: 1 Primary Diabetic Wound/Ulcer of the Lower Extremity Etiology: Wound Location: Left, Proximal, Plantar Foot Wound Open Wounding Event: Blister Status: Date Acquired: 08/02/2019 Comorbid Cataracts, Anemia, Congestive Heart Failure, Hypertension, Type Weeks Of Treatment: 31 History: II Diabetes, Osteomyelitis, Neuropathy Clustered Wound: No Photos Wound Measurements Length: (cm) 0.2 Width: (cm) 0.2 Depth: (cm) 0.1 Area: (cm) 0.031 Volume: (cm) 0.003 % Reduction in Area: 99.3% % Reduction in Volume: 100% Epithelialization: Medium (34-66%) Wound Description Classification: Grade 2 Wound Margin: Well defined, not attached Exudate Amount: Medium Exudate Type: Serosanguineous Exudate Color: red, brown Foul Odor After Cleansing: No Slough/Fibrino No Wound Bed Granulation Amount: Large (67-100%) Exposed Structure Granulation Quality: Pink Fascia Exposed: No Necrotic Amount: None Present (0%) Fat Layer (Subcutaneous Tissue) Exposed: Yes Tendon Exposed: No Muscle Exposed: No Joint Exposed: No Bone Exposed: No Treatment Notes Wound #1 (Foot) Wound Laterality: Plantar, Left, Proximal Cleanser Peri-Wound Care Sween Lotion (Moisturizing lotion) Discharge Instruction: Apply moisturizing lotion or equivalent moisterizing lotion to foot and leg with dressing  changes Topical Primary Dressing KerraCel Ag Gelling Fiber Dressing, 2x2 in (silver alginate) Discharge Instruction: Apply silver alginate to wound bed as instructed Secondary Dressing Woven Gauze Sponge, Non-Sterile 4x4 in Discharge Instruction: Apply over primary dressing as directed. Optifoam Non-Adhesive Dressing, 4x4 in Discharge Instruction: Foam to first met head for protection Secured With Compression Wrap Kerlix Roll 4.5x3.1 (in/yd) Discharge Instruction: Apply Kerlix and Coban compression as directed. Coban Self-Adherent Wrap 4x5 (in/yd) Discharge Instruction: Apply over Kerlix as directed. Compression Stockings Add-Ons Electronic Signature(s) Signed: 04/04/2020 5:18:29 PM By: Lorrin Jackson Signed: 04/07/2020 10:23:55 AM By: Sandre Kitty Entered By: Sandre Kitty on 04/04/2020 16:40:34 -------------------------------------------------------------------------------- [facility]a Details Patient Name: Date of Service: MA [name]. 04/04/2020 3:00 PM Medical Record Number: [id number] Patient Account Number: [id number] Date of Birth/Sex: Treating RN: August 21, 1965 (55 y.o. [name] Primary Care Samanvitha Germany: PA Haig Prophet, [place] Other Clinician: Referring Fatisha Rabalais: Treating Chandell Attridge/Extender: Tonette Bihari, [name] in Treatment: 31 Vital Signs Time Taken: 15:46 Temperature (F): 98.1 Height (in): 67 Pulse (bpm): 90 Weight (lbs): 200 Respiratory Rate (breaths/min): 17 Body Mass Index (BMI): 31.3 Blood Pressure (mmHg): 138/75 Reference Range: 80 - 120 mg / dl Electronic Signature(s) Signed: 04/07/2020 10:23:55 AM By: Sandre Kitty Entered By: Sandre Kitty on 04/04/2020 15:46:44

## 2020-04-08 ENCOUNTER — Inpatient Hospital Stay: Payer: Medicare Other

## 2020-04-09 ENCOUNTER — Inpatient Hospital Stay: Payer: Medicare Other | Attending: Hematology and Oncology

## 2020-04-09 ENCOUNTER — Inpatient Hospital Stay: Payer: Medicare Other

## 2020-04-09 ENCOUNTER — Encounter: Payer: Self-pay | Admitting: *Deleted

## 2020-04-09 ENCOUNTER — Other Ambulatory Visit: Payer: Self-pay | Admitting: Hematology and Oncology

## 2020-04-09 ENCOUNTER — Other Ambulatory Visit: Payer: Self-pay

## 2020-04-09 VITALS — BP 124/62 | HR 71 | Temp 98.2°F | Resp 18

## 2020-04-09 DIAGNOSIS — D693 Immune thrombocytopenic purpura: Secondary | ICD-10-CM | POA: Diagnosis not present

## 2020-04-09 DIAGNOSIS — Z79899 Other long term (current) drug therapy: Secondary | ICD-10-CM | POA: Insufficient documentation

## 2020-04-09 DIAGNOSIS — D696 Thrombocytopenia, unspecified: Secondary | ICD-10-CM

## 2020-04-09 LAB — CMP (CANCER CENTER ONLY)
ALT: 22 U/L (ref 0–44)
AST: 39 U/L (ref 15–41)
Albumin: 3.9 g/dL (ref 3.5–5.0)
Alkaline Phosphatase: 84 U/L (ref 38–126)
Anion gap: 16 — ABNORMAL HIGH (ref 5–15)
BUN: 27 mg/dL — ABNORMAL HIGH (ref 6–20)
CO2: 20 mmol/L — ABNORMAL LOW (ref 22–32)
Calcium: 8.5 mg/dL — ABNORMAL LOW (ref 8.9–10.3)
Chloride: 100 mmol/L (ref 98–111)
Creatinine: 1.31 mg/dL — ABNORMAL HIGH (ref 0.44–1.00)
GFR, Estimated: 49 mL/min — ABNORMAL LOW (ref >60)
Glucose, Bld: 138 mg/dL — ABNORMAL HIGH (ref 70–99)
Potassium: 4.9 mmol/L (ref 3.5–5.1)
Sodium: 136 mmol/L (ref 135–145)
Total Bilirubin: 0.4 mg/dL (ref 0.3–1.2)
Total Protein: 8.1 g/dL (ref 6.5–8.1)

## 2020-04-09 LAB — CBC WITH DIFFERENTIAL (CANCER CENTER ONLY)
Abs Immature Granulocytes: 0.01 10*3/uL (ref 0.00–0.07)
Basophils Absolute: 0 10*3/uL (ref 0.0–0.1)
Basophils Relative: 1 %
Eosinophils Absolute: 0.1 10*3/uL (ref 0.0–0.5)
Eosinophils Relative: 3 %
HCT: 39.3 % (ref 36.0–46.0)
Hemoglobin: 12.1 g/dL (ref 12.0–15.0)
Immature Granulocytes: 0 %
Lymphocytes Relative: 23 %
Lymphs Abs: 0.9 10*3/uL (ref 0.7–4.0)
MCH: 28.3 pg (ref 26.0–34.0)
MCHC: 30.8 g/dL (ref 30.0–36.0)
MCV: 92 fL (ref 80.0–100.0)
Monocytes Absolute: 0.5 10*3/uL (ref 0.1–1.0)
Monocytes Relative: 13 %
Neutro Abs: 2.4 10*3/uL (ref 1.7–7.7)
Neutrophils Relative %: 60 %
Platelet Count: <5 10*3/uL — CL (ref 150–400)
RBC: 4.27 MIL/uL (ref 3.87–5.11)
RDW: 15.3 % (ref 11.5–15.5)
WBC Count: 3.9 10*3/uL — ABNORMAL LOW (ref 4.0–10.5)
nRBC: 0 % (ref 0.0–0.2)

## 2020-04-09 MED ORDER — ROMIPLOSTIM INJECTION 500 MCG
300.0000 ug | Freq: Once | SUBCUTANEOUS | Status: AC
  Administered 2020-04-09: 300 ug via SUBCUTANEOUS
  Filled 2020-04-09: qty 0.6

## 2020-04-09 NOTE — Progress Notes (Signed)
Pltc < 5 today. Last Nplate given 3/23 at 2 mcg/kg. Dr. Pamelia Hoit would like to increase Nplate dose to 3 mcg/kg.  Ebony Hail, Pharm.D., CPP 04/09/2020@4 :15 PM

## 2020-04-09 NOTE — Progress Notes (Signed)
CRITICAL VALUE STICKER  CRITICAL VALUE: plt <5  Donia Ast, RN  DATE & TIME NOTIFIED: 04/09/20   MD NOTIFIED: Serena Croissant, MD  TIME OF NOTIFICATION: 04/09/20 at 1600  RESPONSE: Per MD pt needing IVIG tx.  Pt notified and high priority message sent to scheduling.

## 2020-04-09 NOTE — Progress Notes (Signed)
Severe thrombocytopenia: I recommended IVIG treatments. I also recommended increasing the dosage of Nplate to 3 mcg/kg

## 2020-04-10 ENCOUNTER — Telehealth: Payer: Self-pay | Admitting: Hematology and Oncology

## 2020-04-10 ENCOUNTER — Ambulatory Visit: Payer: Medicare Other

## 2020-04-10 ENCOUNTER — Telehealth: Payer: Self-pay | Admitting: *Deleted

## 2020-04-10 NOTE — Telephone Encounter (Signed)
Appt scheduled per 4/6 sch msg. Called pt, no answer. Left vm with appt date and time. Also informed pt in msg that appt will be at the [facility] location.

## 2020-04-10 NOTE — Telephone Encounter (Signed)
Received call from pt stating she does not want to receive IVIG tx at [facility].  RN explained to pt that the soonest she would be able to receive TX at [facility] would be 4/18.  Pt states she would prefer to wait until 4/18.  Pt educated on risks of waiting for tx, verbalizes understanding and states she would prefer to wait.  Message sent to scheduling to change apt date and time.

## 2020-04-10 NOTE — Telephone Encounter (Signed)
Rescheduled per 4/7 secure chat. Called and spoke with pt, confirmed 4/18 appt

## 2020-04-13 DIAGNOSIS — E1140 Type 2 diabetes mellitus with diabetic neuropathy, unspecified: Secondary | ICD-10-CM | POA: Diagnosis not present

## 2020-04-13 DIAGNOSIS — D693 Immune thrombocytopenic purpura: Secondary | ICD-10-CM | POA: Diagnosis not present

## 2020-04-13 DIAGNOSIS — E1151 Type 2 diabetes mellitus with diabetic peripheral angiopathy without gangrene: Secondary | ICD-10-CM | POA: Diagnosis not present

## 2020-04-13 DIAGNOSIS — D631 Anemia in chronic kidney disease: Secondary | ICD-10-CM | POA: Diagnosis not present

## 2020-04-13 DIAGNOSIS — I5032 Chronic diastolic (congestive) heart failure: Secondary | ICD-10-CM | POA: Diagnosis not present

## 2020-04-13 DIAGNOSIS — M86672 Other chronic osteomyelitis, left ankle and foot: Secondary | ICD-10-CM | POA: Diagnosis not present

## 2020-04-13 DIAGNOSIS — I130 Hypertensive heart and chronic kidney disease with heart failure and stage 1 through stage 4 chronic kidney disease, or unspecified chronic kidney disease: Secondary | ICD-10-CM | POA: Diagnosis not present

## 2020-04-13 DIAGNOSIS — E1169 Type 2 diabetes mellitus with other specified complication: Secondary | ICD-10-CM | POA: Diagnosis not present

## 2020-04-13 DIAGNOSIS — E875 Hyperkalemia: Secondary | ICD-10-CM | POA: Diagnosis not present

## 2020-04-13 DIAGNOSIS — J9621 Acute and chronic respiratory failure with hypoxia: Secondary | ICD-10-CM | POA: Diagnosis not present

## 2020-04-13 DIAGNOSIS — N1832 Chronic kidney disease, stage 3b: Secondary | ICD-10-CM | POA: Diagnosis not present

## 2020-04-13 DIAGNOSIS — E11621 Type 2 diabetes mellitus with foot ulcer: Secondary | ICD-10-CM | POA: Diagnosis not present

## 2020-04-13 DIAGNOSIS — N179 Acute kidney failure, unspecified: Secondary | ICD-10-CM | POA: Diagnosis not present

## 2020-04-13 DIAGNOSIS — I890 Lymphedema, not elsewhere classified: Secondary | ICD-10-CM | POA: Diagnosis not present

## 2020-04-13 DIAGNOSIS — E1122 Type 2 diabetes mellitus with diabetic chronic kidney disease: Secondary | ICD-10-CM | POA: Diagnosis not present

## 2020-04-13 DIAGNOSIS — L97422 Non-pressure chronic ulcer of left heel and midfoot with fat layer exposed: Secondary | ICD-10-CM | POA: Diagnosis not present

## 2020-04-14 ENCOUNTER — Ambulatory Visit: Payer: Medicare Other

## 2020-04-15 ENCOUNTER — Inpatient Hospital Stay: Payer: Medicare Other | Admitting: Hematology and Oncology

## 2020-04-15 ENCOUNTER — Inpatient Hospital Stay: Payer: Medicare Other

## 2020-04-16 NOTE — Progress Notes (Signed)
Patient Care Team: Patient, No Pcp Per (Inactive) as PCP - General (General Practice)  DIAGNOSIS:    ICD-10-CM   1. Idiopathic thrombocytopenic purpura (ITP) (HCC)  D69.3     CHIEF COMPLIANT: Follow-up of ITP  INTERVAL HISTORY: Cheryl BUCKALEW is a 55 y.o. with above-mentioned history of  ITPpreviously treated withIVIG and currently on weekly Nplate treatment.Labs on 04/09/20 showed Hg 12.1, platelets <5. She presentsto the clinictodayforfollow-up.  She reports no increased bruising or bleeding.  Today her platelets are 10.   ALLERGIES:  has No Known Allergies.  MEDICATIONS:  Current Outpatient Medications  Medication Sig Dispense Refill  . acetaminophen (TYLENOL) 500 MG tablet Take 1,000 mg by mouth as needed (pain).    Marland Kitchen amLODipine (NORVASC) 5 MG tablet Take 1 tablet (5 mg total) by mouth daily. 90 tablet 3  . aspirin EC 81 MG tablet Take 81 mg by mouth daily as needed for moderate pain. Swallow whole.    . Blood Glucose Monitoring Suppl (PRODIGY AUTOCODE BLOOD GLUCOSE) w/Device KIT 1 kit by Does not apply route in the morning, at noon, in the evening, and at bedtime. 1 kit 0  . gabapentin (NEURONTIN) 400 MG capsule Take 1 capsule (400 mg total) by mouth 2 (two) times daily. 180 capsule 3  . isosorbide mononitrate (IMDUR) 30 MG 24 hr tablet Take 1 tablet (30 mg total) by mouth daily. 90 tablet 3  . metFORMIN (GLUCOPHAGE) 500 MG tablet Take 1 tablet (500 mg total) by mouth 2 (two) times daily with a meal. 180 tablet 3  . metoprolol succinate (TOPROL-XL) 50 MG 24 hr tablet Take 1 tablet (50 mg total) by mouth daily. Take with or immediately following a meal. 90 tablet 3  . rosuvastatin (CRESTOR) 20 MG tablet Take 1 tablet (20 mg total) by mouth daily. 90 tablet 3   No current facility-administered medications for this visit.    PHYSICAL EXAMINATION: ECOG PERFORMANCE STATUS: 1 - Symptomatic but completely ambulatory  Vitals:   04/17/20 1103  BP: 131/61  Pulse: 75  Resp:  18  Temp: (!) 97.5 F (36.4 C)  SpO2: 96%   Filed Weights     LABORATORY DATA:  I have reviewed the data as listed CMP Latest Ref Rng & Units 04/09/2020 03/26/2020 03/18/2020  Glucose 70 - 99 mg/dL 138(H) 103(H) 99  BUN 6 - 20 mg/dL 27(H) 24(H) 30(H)  Creatinine 0.44 - 1.00 mg/dL 1.31(H) 1.33(H) 0.93  Sodium 135 - 145 mmol/L 136 139 136  Potassium 3.5 - 5.1 mmol/L 4.9 5.8(H) 4.9  Chloride 98 - 111 mmol/L 100 105 108  CO2 22 - 32 mmol/L 20(L) 22 22  Calcium 8.9 - 10.3 mg/dL 8.5(L) 8.9 8.5(L)  Total Protein 6.5 - 8.1 g/dL 8.1 8.6(H) 8.2(H)  Total Bilirubin 0.3 - 1.2 mg/dL 0.4 0.4 0.5  Alkaline Phos 38 - 126 U/L 84 84 77  AST 15 - 41 U/L 39 38 26  ALT 0 - 44 U/L _0 Lab Results  Component Value Date   WBC 3.6 (L) 04/17/2020   HGB 11.1 (L) 04/17/2020   HCT 35.1 (L) 04/17/2020   MCV 91.6 04/17/2020   PLT 10 (L) 04/17/2020   NEUTROABS 2.3 04/17/2020    ASSESSMENT & PLAN:  Idiopathic thrombocytopenic purpura (ITP) (HCC) Relapsed Refractory ITP: Relapsing ITP: Patient response to steroids and IVIG but because she is a diabetic we cannot use steroids.Normally responds to IV IG Prior IVIG: March 2021, April 2021, June 2021,  Feb 2022 ----------------------------------------------------------------------------------------------------------------------------------------------------------------- Prior treatment:Rituxan weekly x4 started 05/23/2019 (patient missed her treatment on 05/30/2019)  (patient missed multiple appointments)  Hospitalization: 06/25/2019-06/29/2019: Nausea and vomiting Hospitalization 08/02/2019-08/09/2019 cellulitis left foot Hospitalization 03/08/2020-03/11/2020: Relapsed ITP and renal failure: Improved with Nplate and IVIG  08/05/599: Platelets 301, hemoglobin 8.6 09/05/2019: Platelets 110, hemoglobin 11.5 02/28/2020: Platelets 14, hemoglobin 10.1, creatinine 1.63, potassium 6.3 (this is after 2 doses of IVIG) 03/17/2020: Platelets 74, hemoglobin 9 04/09/2020:  Platelets less than 5 (increasing the dosage of Nplate ) 5/61/5379: Platelets 10 (Nplate dosing is being managed by pharmacy)  We discussed extensively about current issues with ITP and the role for IVIG.  Since her platelets have started to increase, she wants to see if it can improve on its own without IVIG.  I discussed with her that she needs to be compliant on her appointments and to come weekly if not her platelets will go down again.  She agreed to this plan and therefore I will cancel the IVIG treatment on 04/21/2020.  Return to clinic weekly for lab check and Nplate treatments.    No orders of the defined types were placed in this encounter.  The patient has a good understanding of the overall plan. she agrees with it. she will call with any problems that may develop before the next visit here.  Total time spent: 30 mins including face to face time and time spent for planning, charting and coordination of care  Rulon Eisenmenger, MD, MPH 04/17/2020  I, Molly Dorshimer, am acting as scribe for Dr. Nicholas Lose.  I have reviewed the above documentation for accuracy and completeness, and I agree with the above.

## 2020-04-16 NOTE — Assessment & Plan Note (Signed)
Relapsed Refractory ITP: Relapsing ITP: Patient response to steroids and IVIG but because she is a diabetic we cannot use steroids.Normally responds to IV IG Prior IVIG: March 2021, April 2021, June 2021, Feb 2022 ----------------------------------------------------------------------------------------------------------------------------------------------------------------- Prior treatment:Rituxan weekly x4 started 05/23/2019 (patient missed her treatment on 05/30/2019)  (patient missed multiple appointments)  Hospitalization: 06/25/2019-06/29/2019: Nausea and vomiting Hospitalization 08/02/2019-08/09/2019 cellulitis left foot Hospitalization 03/08/2020-03/11/2020: Relapsed ITP and renal failure: Improved with Nplate and IVIG  08/08/2019: Platelets 301, hemoglobin 8.6 09/05/2019: Platelets 110, hemoglobin 11.5 02/28/2020: Platelets 14, hemoglobin 10.1, creatinine 1.63, potassium 6.3 (this is after 2 doses of IVIG) 03/17/2020: Platelets 74, hemoglobin 9 04/09/2020: Platelets less than 5 (increasing the dosage of Nplate and adding IVIG on 04/21/2020)  Return to clinic weekly for lab check and Nplate treatments.

## 2020-04-17 ENCOUNTER — Other Ambulatory Visit: Payer: Self-pay

## 2020-04-17 ENCOUNTER — Inpatient Hospital Stay (HOSPITAL_BASED_OUTPATIENT_CLINIC_OR_DEPARTMENT_OTHER): Payer: Medicare Other | Admitting: Hematology and Oncology

## 2020-04-17 ENCOUNTER — Inpatient Hospital Stay: Payer: Medicare Other

## 2020-04-17 DIAGNOSIS — D693 Immune thrombocytopenic purpura: Secondary | ICD-10-CM | POA: Diagnosis not present

## 2020-04-17 DIAGNOSIS — D696 Thrombocytopenia, unspecified: Secondary | ICD-10-CM

## 2020-04-17 DIAGNOSIS — Z79899 Other long term (current) drug therapy: Secondary | ICD-10-CM | POA: Diagnosis not present

## 2020-04-17 LAB — CBC WITH DIFFERENTIAL (CANCER CENTER ONLY)
Abs Immature Granulocytes: 0.01 10*3/uL (ref 0.00–0.07)
Basophils Absolute: 0 10*3/uL (ref 0.0–0.1)
Basophils Relative: 1 %
Eosinophils Absolute: 0.1 10*3/uL (ref 0.0–0.5)
Eosinophils Relative: 2 %
HCT: 35.1 % — ABNORMAL LOW (ref 36.0–46.0)
Hemoglobin: 11.1 g/dL — ABNORMAL LOW (ref 12.0–15.0)
Immature Granulocytes: 0 %
Lymphocytes Relative: 22 %
Lymphs Abs: 0.8 10*3/uL (ref 0.7–4.0)
MCH: 29 pg (ref 26.0–34.0)
MCHC: 31.6 g/dL (ref 30.0–36.0)
MCV: 91.6 fL (ref 80.0–100.0)
Monocytes Absolute: 0.4 10*3/uL (ref 0.1–1.0)
Monocytes Relative: 12 %
Neutro Abs: 2.3 10*3/uL (ref 1.7–7.7)
Neutrophils Relative %: 63 %
Platelet Count: 10 10*3/uL — ABNORMAL LOW (ref 150–400)
RBC: 3.83 MIL/uL — ABNORMAL LOW (ref 3.87–5.11)
RDW: 14.7 % (ref 11.5–15.5)
WBC Count: 3.6 10*3/uL — ABNORMAL LOW (ref 4.0–10.5)
nRBC: 0 % (ref 0.0–0.2)

## 2020-04-17 LAB — CMP (CANCER CENTER ONLY)
ALT: 18 U/L (ref 0–44)
AST: 25 U/L (ref 15–41)
Albumin: 3.9 g/dL (ref 3.5–5.0)
Alkaline Phosphatase: 95 U/L (ref 38–126)
Anion gap: 14 (ref 5–15)
BUN: 41 mg/dL — ABNORMAL HIGH (ref 6–20)
CO2: 24 mmol/L (ref 22–32)
Calcium: 8.6 mg/dL — ABNORMAL LOW (ref 8.9–10.3)
Chloride: 98 mmol/L (ref 98–111)
Creatinine: 1.52 mg/dL — ABNORMAL HIGH (ref 0.44–1.00)
GFR, Estimated: 41 mL/min — ABNORMAL LOW (ref >60)
Glucose, Bld: 184 mg/dL — ABNORMAL HIGH (ref 70–99)
Potassium: 4.8 mmol/L (ref 3.5–5.1)
Sodium: 136 mmol/L (ref 135–145)
Total Bilirubin: 0.4 mg/dL (ref 0.3–1.2)
Total Protein: 7.8 g/dL (ref 6.5–8.1)

## 2020-04-17 MED ORDER — ROMIPLOSTIM INJECTION 500 MCG
400.0000 ug | Freq: Once | SUBCUTANEOUS | Status: AC
  Administered 2020-04-17: 400 ug via SUBCUTANEOUS
  Filled 2020-04-17: qty 0.8

## 2020-04-17 NOTE — Patient Instructions (Signed)
Romiplostim injection What is this medicine? ROMIPLOSTIM (roe mi PLOE stim) helps your body make more platelets. This medicine is used to treat low platelets caused by chronic idiopathic thrombocytopenic purpura (ITP) or a bone marrow syndrome caused by radiation sickness. This medicine may be used for other purposes; ask your health care provider or pharmacist if you have questions. COMMON BRAND NAME(S): Nplate What should I tell my health care provider before I take this medicine? They need to know if you have any of these conditions:  blood clots  myelodysplastic syndrome  an unusual or allergic reaction to romiplostim, mannitol, other medicines, foods, dyes, or preservatives  pregnant or trying to get pregnant  breast-feeding How should I use this medicine? This medicine is injected under the skin. It is given by a health care provider in a hospital or clinic setting. A special MedGuide will be given to you before each treatment. Be sure to read this information carefully each time. Talk to your health care provider about the use of this medicine in children. While it may be prescribed for children as young as newborns for selected conditions, precautions do apply. Overdosage: If you think you have taken too much of this medicine contact a poison control center or emergency room at once. NOTE: This medicine is only for you. Do not share this medicine with others. What if I miss a dose? Keep appointments for follow-up doses. It is important not to miss your dose. Call your health care provider if you are unable to keep an appointment. What may interact with this medicine? Interactions are not expected. This list may not describe all possible interactions. Give your health care provider a list of all the medicines, herbs, non-prescription drugs, or dietary supplements you use. Also tell them if you smoke, drink alcohol, or use illegal drugs. Some items may interact with your  medicine. What should I watch for while using this medicine? Visit your health care provider for regular checks on your progress. You may need blood work done while you are taking this medicine. Your condition will be monitored carefully while you are receiving this medicine. It is important not to miss any appointments. What side effects may I notice from receiving this medicine? Side effects that you should report to your doctor or health care professional as soon as possible:  allergic reactions (skin rash, itching or hives; swelling of the face, lips, or tongue)  bleeding (bloody or black, tarry stools; red or dark brown urine; spitting up blood or brown material that looks like coffee grounds; red spots on the skin; unusual bruising or bleeding from the eyes, gums, or nose)  blood clot (chest pain; shortness of breath; pain, swelling, or warmth in the leg)  stroke (changes in vision; confusion; trouble speaking or understanding; severe headaches; sudden numbness or weakness of the face, arm or leg; trouble walking; dizziness; loss of balance or coordination) Side effects that usually do not require medical attention (report to your doctor or health care professional if they continue or are bothersome):  diarrhea  dizziness  headache  joint pain  muscle pain  stomach pain  trouble sleeping This list may not describe all possible side effects. Call your doctor for medical advice about side effects. You may report side effects to FDA at 1-800-FDA-1088. Where should I keep my medicine? This medicine is given in a hospital or clinic. It will not be stored at home. NOTE: This sheet is a summary. It may not cover all possible   information. If you have questions about this medicine, talk to your doctor, pharmacist, or health care provider.  2021 Elsevier/Gold Standard (2019-02-05 10:28:13)  

## 2020-04-18 ENCOUNTER — Telehealth: Payer: Self-pay | Admitting: Hematology and Oncology

## 2020-04-18 ENCOUNTER — Encounter (HOSPITAL_BASED_OUTPATIENT_CLINIC_OR_DEPARTMENT_OTHER): Payer: Medicare Other | Admitting: Internal Medicine

## 2020-04-18 DIAGNOSIS — Z993 Dependence on wheelchair: Secondary | ICD-10-CM | POA: Diagnosis not present

## 2020-04-18 DIAGNOSIS — N183 Chronic kidney disease, stage 3 unspecified: Secondary | ICD-10-CM | POA: Diagnosis not present

## 2020-04-18 DIAGNOSIS — E1169 Type 2 diabetes mellitus with other specified complication: Secondary | ICD-10-CM | POA: Diagnosis not present

## 2020-04-18 DIAGNOSIS — M86672 Other chronic osteomyelitis, left ankle and foot: Secondary | ICD-10-CM | POA: Diagnosis not present

## 2020-04-18 DIAGNOSIS — Z89511 Acquired absence of right leg below knee: Secondary | ICD-10-CM | POA: Diagnosis not present

## 2020-04-18 DIAGNOSIS — L97523 Non-pressure chronic ulcer of other part of left foot with necrosis of muscle: Secondary | ICD-10-CM | POA: Diagnosis not present

## 2020-04-18 DIAGNOSIS — E1142 Type 2 diabetes mellitus with diabetic polyneuropathy: Secondary | ICD-10-CM | POA: Diagnosis not present

## 2020-04-18 DIAGNOSIS — I130 Hypertensive heart and chronic kidney disease with heart failure and stage 1 through stage 4 chronic kidney disease, or unspecified chronic kidney disease: Secondary | ICD-10-CM | POA: Diagnosis not present

## 2020-04-18 DIAGNOSIS — I5032 Chronic diastolic (congestive) heart failure: Secondary | ICD-10-CM | POA: Diagnosis not present

## 2020-04-18 DIAGNOSIS — E11621 Type 2 diabetes mellitus with foot ulcer: Secondary | ICD-10-CM | POA: Diagnosis not present

## 2020-04-18 DIAGNOSIS — E1122 Type 2 diabetes mellitus with diabetic chronic kidney disease: Secondary | ICD-10-CM | POA: Diagnosis not present

## 2020-04-18 NOTE — Telephone Encounter (Signed)
R/s appts per 4/14 sch msg. Called pt, no answer. Left msg with updated appts date and times.

## 2020-04-18 NOTE — Progress Notes (Addendum)
[name], [name] ([id number]) Visit Report for 04/18/2020 Arrival Information Details Patient Name: Date of Service: MA Cheryl ArRTIN, [name] 04/18/2020 3:00 PM Medical Record Number: [id number] Patient Account Number: [id number] Date of Birth/Sex: Treating RN: 02/03/1965 (55 y.o. [name]) Deaton, Bobbi Primary Care Breyon Blass: PA [place], [place] Other Clinician: Referring Abdalla Naramore: Treating Joanette Silveria/Extender: Grayland Jackobson, Michael King, Donivan Scullrystal Weeks in Treatment: 33 Visit Information History Since Last Visit Added or deleted any medications: No Patient Arrived: Wheel Chair Any new allergies or adverse reactions: No Arrival Time: 15:30 Had a fall or experienced change in No Accompanied By: self activities of daily living that may affect Transfer Assistance: None risk of falls: Patient Identification Verified: Yes Signs or symptoms of abuse/neglect since No Secondary Verification Process Completed: Yes last visito Patient Requires Transmission-Based Precautions: No Hospitalized since last visit: No Patient Has Alerts: No Implantable device outside of the clinic No excluding cellular tissue based products placed in the center since last visit: Has Dressing in Place as Prescribed: Yes Has Compression in Place as Prescribed: Yes Has Footwear/Offloading in Place as Yes Prescribed: Left: Surgical Shoe with Pressure Relief Insole Pain Present Now: No Electronic Signature(s) Signed: 04/18/2020 5:54:45 PM By: Shawn Stalleaton, Bobbi Entered By: Shawn Stalleaton, Bobbi on 04/18/2020 15:35:54 -------------------------------------------------------------------------------- Clinic Level of Care Assessment Details Patient Name: Date of Service: Cheryl ElMA RTIN, [name] 04/18/2020 3:00 PM Medical Record Number: [id number] Patient Account Number: [id number] Date of Birth/Sex: Treating RN: 09/13/1965 (55 y.o. [name]) Barnhart, Jodi Primary Care Tully Mcinturff: PA Zenovia JordanIENT, [place] Other Clinician: Referring Ernesto Lashway: Treating Fareedah Mahler/Extender: Grayland Jackobson,  Michael King, Donivan Scullrystal Weeks in Treatment: 33 Clinic Level of Care Assessment Items TOOL 4 Quantity Score X- 1 0 Use when only an EandM is performed on FOLLOW-UP visit ASSESSMENTS - Nursing Assessment / Reassessment X- 1 10 Reassessment of Co-morbidities (includes updates in patient status) X- 1 5 Reassessment of Adherence to Treatment Plan ASSESSMENTS - Wound and Skin A ssessment / Reassessment X - Simple Wound Assessment / Reassessment - one wound 1 5 []  - 0 Complex Wound Assessment / Reassessment - multiple wounds []  - 0 Dermatologic / Skin Assessment (not related to wound area) ASSESSMENTS - Focused Assessment []  - 0 Circumferential Edema Measurements - multi extremities []  - 0 Nutritional Assessment / Counseling / Intervention []  - 0 Lower Extremity Assessment (monofilament, tuning fork, pulses) []  - 0 Peripheral Arterial Disease Assessment (using hand held doppler) ASSESSMENTS - Ostomy and/or Continence Assessment and Care []  - 0 Incontinence Assessment and Management []  - 0 Ostomy Care Assessment and Management (repouching, etc.) PROCESS - Coordination of Care []  - 0 Simple Patient / Family Education for ongoing care X- 1 20 Complex (extensive) Patient / Family Education for ongoing care []  - 0 Staff obtains ChiropractorConsents, Records, T Results / Process Orders est []  - 0 Staff telephones HHA, Nursing Homes / Clarify orders / etc []  - 0 Routine Transfer to another Facility (non-emergent condition) []  - 0 Routine Hospital Admission (non-emergent condition) []  - 0 New Admissions / Manufacturing engineernsurance Authorizations / Ordering NPWT Apligraf, etc. , []  - 0 Emergency Hospital Admission (emergent condition) X- 1 10 Simple Discharge Coordination []  - 0 Complex (extensive) Discharge Coordination PROCESS - Special Needs []  - 0 Pediatric / Minor Patient Management []  - 0 Isolation Patient Management []  - 0 Hearing / Language / Visual special needs []  - 0 Assessment of  Community assistance (transportation, D/C planning, etc.) []  - 0 Additional assistance / Altered mentation []  - 0 Support Surface(s) Assessment (bed, cushion, seat, etc.) INTERVENTIONS - Wound Cleansing /  Measurement []  - 0 Simple Wound Cleansing - one wound []  - 0 Complex Wound Cleansing - multiple wounds []  - 0 Wound Imaging (photographs - any number of wounds) []  - 0 Wound Tracing (instead of photographs) []  - 0 Simple Wound Measurement - one wound []  - 0 Complex Wound Measurement - multiple wounds INTERVENTIONS - Wound Dressings X - Small Wound Dressing one or multiple wounds 1 10 []  - 0 Medium Wound Dressing one or multiple wounds []  - 0 Large Wound Dressing one or multiple wounds []  - 0 Application of Medications - topical []  - 0 Application of Medications - injection INTERVENTIONS - Miscellaneous []  - 0 External ear exam []  - 0 Specimen Collection (cultures, biopsies, blood, body fluids, etc.) []  - 0 Specimen(s) / Culture(s) sent or taken to Lab for analysis []  - 0 Patient Transfer (multiple staff / / Similar devices) []  - 0 Simple Staple / Suture removal (25 or less) []  - 0 Complex Staple / Suture removal (26 or more) []  - 0 Hypo / Hyperglycemic Management (close monitor of Blood Glucose) []  - 0 Ankle / Brachial Index (ABI) - do not check if billed separately X- 1 5 Vital Signs Has the patient been seen at the hospital within the last three years: Yes Total Score: 65 Level Of Care: New/Established - Level 2 Electronic Signature(s) Signed: 04/18/2020 5:21:53 PM By: Entered By: on 04/18/2020 15:47:24 -------------------------------------------------------------------------------- Encounter Discharge Information Details Patient Name: Date of Service: MA . 04/18/2020 3:00 PM Medical Record Number: Patient Account Number: Date of Birth/Sex: Treating RN: [birth year] (55 y.o. Primary Care Brynlei Klausner: PA , Other Clinician: Referring Beniah Magnan: Treating Aamari West/Extender: Nurse, adult, in Treatment: 33 Encounter Discharge Information Items Discharge Condition: Stable Ambulatory Status: Wheelchair Discharge Destination: Home Transportation: Private Auto Accompanied By: self Schedule Follow-up Appointment: No Clinical Summary of Care: Notes foam bordered applied to closed area for protection. Electronic Signature(s) Signed: 04/18/2020 5:54:45 PM By: Entered By: 04/20/2020 on 04/18/2020 16:41:47 -------------------------------------------------------------------------------- Lower Extremity Assessment Details Patient Name: Date of Service: MAEGHAN, CANNY 04/18/2020 3:00 PM Medical Record Number: Cheryl Ar Patient Account Number: 04/20/2020 Date of Birth/Sex: Treating RN: 12/27/1965 (55 y.o. 13/07/1965 Primary Care Jazir Newey: PA 40, [name] Other Clinician: Referring Shuayb Schepers: Treating Nadja Lina/Extender: Zenovia Jordan, [place] in Treatment: 33 Edema Assessment Assessed: [Left: Yes] [Right: No] Edema: [Left: N] [Right: o] Calf Left: Right: Point of Measurement: From Medial Instep 35 cm Ankle Left: Right: Point of Measurement: From Medial Instep 23 cm Vascular Assessment Pulses: Dorsalis Pedis Palpable: [Left:Yes] Electronic Signature(s) Signed: 04/18/2020 5:54:45 PM By: 34 Entered By: 04/20/2020 on 04/18/2020 15:36:34 -------------------------------------------------------------------------------- Multi Wound Chart Details Patient Name: Date of Service: MA Shawn Stall. 04/18/2020 3:00 PM Medical Record Number: Cheryl El Patient Account Number: 04/20/2020 Date of Birth/Sex: Treating RN: 22-Jun-1965 (56 y.o. 13/07/1965 Primary Care Avyn Coate: PA 40, [name] Other Clinician: Referring Oviya Ammar: Treating Arti Trang/Extender: Zenovia Jordan,  [place] in Treatment: 33 Vital Signs Height(in): 67 Pulse(bpm): 82 Weight(lbs): 200 Blood Pressure(mmHg): 130/82 Body Mass Index(BMI): 31 Temperature(F): 98.2 Respiratory Rate(breaths/min): 16 Photos: [1:No Photos Left, Proximal, Plantar Foot] [N/A:N/A N/A] Wound Location: [1:Blister] [N/A:N/A] Wounding Event: [1:Diabetic Wound/Ulcer of the Lower] [N/A:N/A] Primary Etiology: [1:Extremity Cataracts, Anemia, Congestive Heart] [N/A:N/A] Comorbid History: [1:Failure, Hypertension, Type II Diabetes, Osteomyelitis, Neuropathy 08/02/2019] [N/A:N/A] Date Acquired: [1:33] [N/A:N/A] Weeks of Treatment: [1:Healed - Epithelialized] [N/A:N/A] Wound Status: [1:0x0x0] [  N/A:N/A] Measurements L x W x D (cm) [1:0] [N/A:N/A] A (cm) : rea [1:0] [N/A:N/A] Volume (cm) : [1:100.00%] [N/A:N/A] % Reduction in Area: [1:100.00%] [N/A:N/A] % Reduction in Volume: [1:Grade 2] [N/A:N/A] Classification: [1:callous over wound bed.] [N/A:N/A] Treatment Notes Electronic Signature(s) Signed: 04/18/2020 4:57:27 PM By: Baltazar Najjar MD Signed: 04/18/2020 5:21:53 PM By: Antonieta Iba Entered By: Baltazar Najjar on 04/18/2020 15:50:59 -------------------------------------------------------------------------------- Multi-Disciplinary Care Plan Details Patient Name: Date of Service: MA Cheryl Ar. 04/18/2020 3:00 PM Medical Record Number: [id number] Patient Account Number: [id number] Date of Birth/Sex: Treating RN: [birth year] (55 y.o. Cheryl Cluck Primary Care Shayli Altemose: PA Zenovia Jordan, [place] Other Clinician: Referring Jahlon Baines: Treating Mitali Shenefield/Extender: Erven Colla in Treatment: 33 Multidisciplinary Care Plan reviewed with physician Active Inactive Electronic Signature(s) Signed: 04/18/2020 3:50:23 PM By: Antonieta Iba Previous Signature: 04/18/2020 3:17:44 PM Version By: Antonieta Iba Entered By: Antonieta Iba on 04/18/2020  15:50:23 -------------------------------------------------------------------------------- Pain Assessment Details Patient Name: Date of Service: MA KEEVA, REISEN 04/18/2020 3:00 PM Medical Record Number: [id number] Patient Account Number: [id number] Date of Birth/Sex: Treating RN: 14-Mar-1965 (55 y.o. [name] Primary Care Kamarii Buren: PA Zenovia Jordan, [place] Other Clinician: Referring Alia Parsley: Treating Traci Plemons/Extender: Grayland Jack, Donivan Scull in Treatment: 33 Active Problems Location of Pain Severity and Description of Pain Patient Has Paino No Site Locations Rate the pain. Current Pain Level: 0 Pain Management and Medication Current Pain Management: Medication: No Cold Application: No Rest: No Massage: No Activity: No T.E.N.S.: No Heat Application: No Leg drop or elevation: No Is the Current Pain Management Adequate: Adequate How does your wound impact your activities of daily livingo Sleep: No Bathing: No Appetite: No Relationship With Others: No Bladder Continence: No Emotions: No Bladder Continence: No Emotions: No Bowel Continence: No Work: No Toileting: No Drive: No Dressing: No Hobbies: No Electronic Signature(s) Signed: 04/18/2020 5:54:45 PM By: Shawn Stall Entered By: Shawn Stall on 04/18/2020 15:36:23 -------------------------------------------------------------------------------- Patient/Caregiver Education Details Patient Name: Date of Service: MA Cheryl Ar 4/15/2022andnbsp3:00 PM Medical Record Number: [id number] Patient Account Number: [id number] Date of Birth/Gender: Treating RN: 12/14/1965 (55 y.o. Cheryl Cluck Primary Care Physician: PA Zenovia Jordan, [place] Other Clinician: Referring Physician: Treating Physician/Extender: Erven Colla in Treatment: 33 Education Assessment Education Provided To: Patient Education Topics Provided Elevated Blood Sugar/ Impact on Healing: Methods:  Explain/Verbal Responses: State content correctly Wound/Skin Impairment: Methods: Explain/Verbal, Printed Responses: State content correctly Electronic Signature(s) Signed: 04/18/2020 5:21:53 PM By: Antonieta Iba Entered By: Antonieta Iba on 04/18/2020 15:18:13 -------------------------------------------------------------------------------- Wound Assessment Details Patient Name: Date of Service: MA SHANDIIN, EISENBEIS 04/18/2020 3:00 PM Medical Record Number: [id number] Patient Account Number: [id number] Date of Birth/Sex: Treating RN: [birth year] (55 y.o. Debara Pickett, Millard.Loa Primary Care Dezaray Shibuya: PA TIENT, [place] Other Clinician: Referring Adedamola Seto: Treating Thecla Forgione/Extender: Grayland Jack, Donivan Scull in Treatment: 33 Wound Status Wound Number: 1 Primary Diabetic Wound/Ulcer of the Lower Extremity Etiology: Wound Location: Left, Proximal, Plantar Foot Wound Healed - Epithelialized Wounding Event: Blister Status: Date Acquired: 08/02/2019 Comorbid Cataracts, Anemia, Congestive Heart Failure, Hypertension, Type Weeks Of Treatment: 33 History: II Diabetes, Osteomyelitis, Neuropathy Clustered Wound: No Photos Wound Measurements Length: (cm) Width: (cm) Depth: (cm) Area: (cm) Volume: (cm) 0 % Reduction in Area: 100% 0 % Reduction in Volume: 100% 0 0 0 Wound Description Classification: Grade 2 Assessment Notes callous over wound bed. Electronic Signature(s) Signed: 04/18/2020 5:54:45 PM By: Shawn Stall Signed: 04/22/2020 8:07:32 AM By: [name] Entered By: [name] on 04/18/2020 17:03:03 -------------------------------------------------------------------------------- Vitals Details Patient  Name: Date of Service: WAUNITA, SANDSTROM 04/18/2020 3:00 PM Medical Record Number: [id number] Patient Account Number: [id number] Date of Birth/Sex: Treating RN: [birth year] (55 y.o. [name] Primary Care Rayshon Albaugh: PA TIENT, [place] Other  Clinician: Referring Cherrelle Plante: Treating Siaosi Alter/Extender: Grayland Jack, Donivan Scull in Treatment: 33 Vital Signs Time Taken: 15:30 Temperature (F): 98.2 Height (in): 67 Pulse (bpm): 82 Weight (lbs): 200 Respiratory Rate (breaths/min): 16 Body Mass Index (BMI): 31.3 Blood Pressure (mmHg): 130/82 Reference Range: 80 - 120 mg / dl Electronic Signature(s) Signed: 04/18/2020 5:54:45 PM By: Shawn Stall Entered By: Shawn Stall on 04/18/2020 15:36:15

## 2020-04-18 NOTE — Progress Notes (Signed)
[name], [name] ([id number]) Visit Report for 04/18/2020 HPI Details Patient Name: Date of Service: [name] 04/18/2020 3:00 PM Medical Record Number: [id number] Patient Account Number: [id number] Date of Birth/Sex: Treating RN: [birth year] (55 y.o. [name] Primary Care Provider: PA Haig Prophet, [place] Other Clinician: Referring Provider: Treating Provider/Extender: Tonette Bihari, [name] in Treatment: 33 History of Present Illness HPI Description: 28ADMISSION 08/30/2019 This is a 55 year old woman with type 2 diabetes and peripheral neuropathy. She developed a blister on the plantar left foot on 7/27. She was seen in the emergency room treated. This did not get any better and she required admission to the hospital from 08/02/2019 through 08/09/2019. She was felt to have cellulitis of the left foot. She was treated with IV medications/antibiotics discharged on Augmentin however she could not tolerate this and she was changed to doxycycline for 5 days she has completed this. She has advanced home care they are apparently putting Xeroform over the wound. When she left the hospital she was supposed to be putting Santyl over the black areas on the left plantar foot covered with Xeroform. X-rays done in the hospital did not show evidence of osteomyelitis only a superficial plantar ulceration. She had blood cultures but I do not see a wound culture. The patient has a past medical history of a right below-knee amputation by Dr. [name] in 2012 presumably for infection in this area although I am not certain. As noted she is a type II diabetic on oral agents with a recent hemoglobin A1c of 5.5. Other past medical history includes ITP, peripheral edema, chronic kidney disease stage III, hypertension, right below-knee amputation, diastolic heart failure. She tells me she is wheelchair-bound and is nonweightbearing although she does push the wheelchair along with the left foot. She is  only using hospital sock on the left foot. ABI in our clinic was 1.06 on the left 9/7; the patient cultured Pseudomonas last week out of the deeper proximal wound. MRI that was ordered showed progressive soft tissue ulceration along the plantar aspect of the foot especially along the medial arch but also plantar to the first metatarsal phalangeal joint no drainable abscess or foreign body. There was a suspicion for early osteomyelitis in the fifth metatarsal base but she does not have a wound in this area. Also problematically is that where we put her prescription for ciprofloxacin and they did not have the antibiotic and we verified that they are out of ciprofloxacin. We did not receive the message from them to this effect The patient did see Dr. [name] of podiatry. The wound was debrided. He prescribed Santyl which I do not think she is picked up either. Lab work including a CBC sedimentation rate and C-reactive protein were ordered. Finally she is using silver alginate she has home health changing the dressing 3 times a week she does not disturb this in between times 9/23; we have not seen this lady in 2-1/2 weeks largely because of transportation issues. She still has 3 wound areas a deep area in the midfoot and area just above this and then an area on the first met head. She has been using silver alginate on the wound. They are trying to use home health however they are trying to teach her how to do this at be been very difficult for this patient to do so. In discussing things with her she uses this foot to propel her wheelchair I have asked her to stop doing this and to use  her arms. She states that she is only moving around in her house. We will need to have a look at her lab work that I quoted from the last time she was here that was ordered by Dr. [name] of podiatry. By our MRI she does not have osteomyelitis in any of the wounded areas. 10/7 2-week follow-up. The patient has 3 wounds  including superficially over the left first metatarsal head, left first submetatarsal in the lateral foot and a deep area more distally. Her MRI did not show osteomyelitis in reference to the wounds that we are treating although did suggest an early possibility in the left fifth metatarsal head however there is no wound in this area. I have reviewed her inflammatory markers. Most recently on 7/26 she had a sedimentation rate of 117 although this was previously greater than 140 and then 108. Last CRP was on 8/20 at 5.1 comparing to previously at 4.27. Noteworthy that this is when she was in the hospital in July I believe. 10/19; patient missed her appointment last week because of transportation issues. We have been using silver collagen. She still has the area over the metatarsal head and the mid part of her foot which are superficial wounds about the same. Marked deterioration in the problematic distal wound. This now is right down into the fascial layer of her foot. Our intake nurse noted purulent drainage. She has a small satellite lesion towards the heel 10/28; PCR culture of the major wound she has on her proximal plantar foot grew both Peptostreptococcus and Corynebacterium. The Corynebacterium could easily be a skin contaminant but Peptostreptococcus deserve treatment and I started her on Augmentin she arrives in clinic today with the wound looking a lot better 11/5; Finishing her augmentin. Patient's foot is not doing well. Now with a probing area laterally of the major wound going laterally. she presents with a new wound on the lateral part of the left foot with purulent drainage. this tunnels widely and toward the plantar foot. It does not connect with the major plantar wound however I am concerned 11/12; Wound on the left lateral foot that had some purulent drainage last week I cultured and that was negative although she was still on Augmentin at that time which may have affected the  culture. Other than that she has the same 3 areas on the plantar part of the left foot including one over the third metatarsal head, a deep area proximally which has always been her largest wound and one in between. The one in the third metatarsal head and one just distal to this of always been superficial where she has a deep wound in the proximal part of the foot. Today the lateral part of the deeper wound is not nearly ass easy to probe this had a considerable distance last week I told the patient that really the approach this is going to depend on the result of the MRI which is on Monday. If she has an underlying abscess and/or osteomyelitis she will need serious consideration of amputation in this patient who is nonambulatory plus or minus foot surgery and IV antibiotics. We really have not made a lot of progress with her original wounds. 12/3; surprisingly the patient's MRI did not show osteomyelitis but an extensive cellulitis soft tissue infection. With some difficulty 10 days ago we managed to get her on Levaquin and Flagyl. The patient arrives back in clinic today with her 3 original wounds. The area on the lateral foot has closed.  12/17; 2-week follow-up. She is completing her Levaquin should have another 2 days. No additional antibiotics are felt to be necessary. The tunneling area to the lateral part of her foot is closed over. Of the 3 wounds we have been dealing with the area over the met head is fully epithelialized. The middle area it is still eschared and the major distal area is heavily surrounded by thick callus 1/6; she completed her antibiotics has not been here in 3 weeks however. We have been using silver alginate. 2 remaining wounds on the plantar foot she is a diabetic. She does not have arterial issues. MRI did not show osteomyelitis but extensive cellulitis and soft tissue infection. 1/27; 3-week follow-up. She has home health who called [place] to report that her medications  were mixed up she apparently has rectified the situation. We have been using silver collagen to the 2 remaining areas on her foot 2/11; the patient is down to the 1 wound most towards her plantar heel. The rest of this is closed. She has been using silver collagen and changing the dressing herself 2/25; the area towards her plantar heel in the midfoot is much better much smaller. She has been using silver collagen she has home health coming up to change the dressing Monday Wednesday and she is seen here on Friday. Doing remarkably well 3/18; midfoot plantar wound. She has been using silver collagen. Came in with the area eschared and I thought she actually might be healed. She also tells [place] since she was last here she was hospitalized for low platelet count. 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 All that is remaining of the 3 large areas she had. She has been using silver collagen. 4/15; midfoot plantar wound on the left foot. All of this is totally closed she has had some callus on this wound that I gently explored but there is nothing open here. She has a remote right BKA and unfortunately the left plantar foot wounds which were initially I think related to deep tissue infection in her foot have prevented her from getting a prosthesis. We had problems with foot infections here it took a long time to get the 3 wounds to close the last one was the more proximal one had considerable depth at 1 point Electronic Signature(s) Signed: 04/18/2020 4:57:27 PM By: Linton Ham MD Entered By: Linton Ham on 04/18/2020 15:52:55 -------------------------------------------------------------------------------- Physical Exam Details Patient Name: Date of Service: [name]. 04/18/2020 3:00 PM Medical Record Number: [id number] Patient Account Number: [id number] Date of Birth/Sex: Treating RN: 06-24-1965 (55 y.o. [name] Primary Care Provider: PA Haig Prophet, [place] Other  Clinician: Referring Provider: Treating Provider/Extender: Tonette Bihari, [name] in Treatment: 33 Constitutional Sitting or standing Blood Pressure is within target range for patient.. Pulse regular and within target range for patient.Marland Kitchen Respirations regular, non-labored and within target range.. Temperature is normal and within the target range for the patient.Marland Kitchen Appears in no distress. Notes Wound exam; plantar midfoot. She has some callus around the wound I explored this there is nothing open here. Everything is closed Electronic Signature(s) Signed: 04/18/2020 4:57:27 PM By: Linton Ham MD Entered By: Linton Ham on 04/18/2020 15:53:37 -------------------------------------------------------------------------------- Physician Orders Details Patient Name: Date of Service: Cheryl FLORIS, Wolf 04/18/2020 3:00 PM Medical Record Number: [id number] Patient Account Number: [id number] Date of Birth/Sex: Treating RN: August 19, 1965 (55 y.o. [name] Primary Care Provider: PA Haig Prophet, [place] Other Clinician: Referring Provider: Treating Provider/Extender: Linton Ham  Edison Pace, Crystal Weeks in Treatment: 33 Verbal / Phone Orders: No Diagnosis Coding Discharge From [facility] Services Discharge from [place] Edema Control - Lymphedema / SCD / Other Left Lower Extremity Elevate legs to the level of the heart or above for 30 minutes daily and/or when sitting, a frequency of: - throughout the day Moisturize legs daily. - Lotion to feet and legs Off-Loading Other: - Pad bottom of foot with foam border (change every few days) and use shoe inserts for protection. [facility] for wound care. Electronic Signature(s) Signed: 04/18/2020 4:57:27 PM By: Linton Ham MD Signed: 04/18/2020 5:21:53 PM By: Lorrin Jackson Previous Signature: 04/18/2020 3:16:37 PM Version By: Lorrin Jackson Entered By: Lorrin Jackson on 04/18/2020  15:46:04 -------------------------------------------------------------------------------- Problem List Details Patient Name: Date of Service: Cheryl DANYLE, BOENING 04/18/2020 3:00 PM Medical Record Number: [id number] Patient Account Number: [id number] Date of Birth/Sex: Treating RN: 1965/11/14 (55 y.o. [name] Primary Care Provider: PA TIENT, [place] Other Clinician: Referring Provider: Treating Provider/Extender: Tonette Bihari, [name] in Treatment: 33 Active Problems ICD-10 Encounter Code Description Active Date MDM Diagnosis E11.621 Type 2 diabetes mellitus with foot ulcer 08/30/2019 No Yes L97.523 Non-pressure chronic ulcer of other part of left foot with necrosis of muscle 08/30/2019 No Yes Z89.511 Acquired absence of right leg below knee 08/30/2019 No Yes M86.672 Other chronic osteomyelitis, left ankle and foot 08/30/2019 No Yes Inactive Problems Resolved Problems Electronic Signature(s) Signed: 04/18/2020 4:57:27 PM By: Linton Ham MD Entered By: Linton Ham on 04/18/2020 15:50:51 -------------------------------------------------------------------------------- Progress Note Details Patient Name: Date of Service: [name]. 04/18/2020 3:00 PM Medical Record Number: [id number] Patient Account Number: [id number] Date of Birth/Sex: Treating RN: Aug 15, 1965 (55 y.o. [name] Primary Care Provider: PA Haig Prophet, [place] Other Clinician: Referring Provider: Treating Provider/Extender: Tonette Bihari, [name] in Treatment: 33 Subjective History of Present Illness (HPI) 28ADMISSION 08/30/2019 This is a 55 year old woman with type 2 diabetes and peripheral neuropathy. She developed a blister on the plantar left foot on 7/27. She was seen in the emergency room treated. This did not get any better and she required admission to the hospital from 08/02/2019 through 08/09/2019. She was felt to have cellulitis of the left foot. She was treated  with IV medications/antibiotics discharged on Augmentin however she could not tolerate this and she was changed to doxycycline for 5 days she has completed this. She has advanced home care they are apparently putting Xeroform over the wound. When she left the hospital she was supposed to be putting Santyl over the black areas on the left plantar foot covered with Xeroform. X-rays done in the hospital did not show evidence of osteomyelitis only a superficial plantar ulceration. She had blood cultures but I do not see a wound culture. The patient has a past medical history of a right below-knee amputation by Dr. [name] in 2012 presumably for infection in this area although I am not certain. As noted she is a type II diabetic on oral agents with a recent hemoglobin A1c of 5.5. Other past medical history includes ITP, peripheral edema, chronic kidney disease stage III, hypertension, right below-knee amputation, diastolic heart failure. She tells me she is wheelchair-bound and is nonweightbearing although she does push the wheelchair along with the left foot. She is only using hospital sock on the left foot. ABI in our clinic was 1.06 on the left 9/7; the patient cultured Pseudomonas last week out of the deeper proximal wound. MRI that was  ordered showed progressive soft tissue ulceration along the plantar aspect of the foot especially along the medial arch but also plantar to the first metatarsal phalangeal joint no drainable abscess or foreign body. There was a suspicion for early osteomyelitis in the fifth metatarsal base but she does not have a wound in this area. Also problematically is that where we put her prescription for ciprofloxacin and they did not have the antibiotic and we verified that they are out of ciprofloxacin. We did not receive the message from them to this effect The patient did see Dr. [name] of podiatry. The wound was debrided. He prescribed Santyl which I do not think she is  picked up either. Lab work including a CBC sedimentation rate and C-reactive protein were ordered. Finally she is using silver alginate she has home health changing the dressing 3 times a week she does not disturb this in between times 9/23; we have not seen this lady in 2-1/2 weeks largely because of transportation issues. She still has 3 wound areas a deep area in the midfoot and area just above this and then an area on the first met head. She has been using silver alginate on the wound. They are trying to use home health however they are trying to teach her how to do this at be been very difficult for this patient to do so. In discussing things with her she uses this foot to propel her wheelchair I have asked her to stop doing this and to use her arms. She states that she is only moving around in her house. We will need to have a look at her lab work that I quoted from the last time she was here that was ordered by Dr. [name] of podiatry. By our MRI she does not have osteomyelitis in any of the wounded areas. 10/7 2-week follow-up. The patient has 3 wounds including superficially over the left first metatarsal head, left first submetatarsal in the lateral foot and a deep area more distally. Her MRI did not show osteomyelitis in reference to the wounds that we are treating although did suggest an early possibility in the left fifth metatarsal head however there is no wound in this area. I have reviewed her inflammatory markers. Most recently on 7/26 she had a sedimentation rate of 117 although this was previously greater than 140 and then 108. Last CRP was on 8/20 at 5.1 comparing to previously at 4.27. Noteworthy that this is when she was in the hospital in July I believe. 10/19; patient missed her appointment last week because of transportation issues. We have been using silver collagen. She still has the area over the metatarsal head and the mid part of her foot which are superficial wounds  about the same. Marked deterioration in the problematic distal wound. This now is right down into the fascial layer of her foot. Our intake nurse noted purulent drainage. She has a small satellite lesion towards the heel 10/28; PCR culture of the major wound she has on her proximal plantar foot grew both Peptostreptococcus and Corynebacterium. The Corynebacterium could easily be a skin contaminant but Peptostreptococcus deserve treatment and I started her on Augmentin she arrives in clinic today with the wound looking a lot better 11/5; Finishing her augmentin. Patient's foot is not doing well. Now with a probing area laterally of the major wound going laterally. she presents with a new wound on the lateral part of the left foot with purulent drainage. this tunnels widely and toward  the plantar foot. It does not connect with the major plantar wound however I am concerned 11/12; Wound on the left lateral foot that had some purulent drainage last week I cultured and that was negative although she was still on Augmentin at that time which may have affected the culture. Other than that she has the same 3 areas on the plantar part of the left foot including one over the third metatarsal head, a deep area proximally which has always been her largest wound and one in between. The one in the third metatarsal head and one just distal to this of always been superficial where she has a deep wound in the proximal part of the foot. Today the lateral part of the deeper wound is not nearly ass easy to probe this had a considerable distance last week I told the patient that really the approach this is going to depend on the result of the MRI which is on Monday. If she has an underlying abscess and/or osteomyelitis she will need serious consideration of amputation in this patient who is nonambulatory plus or minus foot surgery and IV antibiotics. We really have not made a lot of progress with her original  wounds. 12/3; surprisingly the patient's MRI did not show osteomyelitis but an extensive cellulitis soft tissue infection. With some difficulty 10 days ago we managed to get her on Levaquin and Flagyl. The patient arrives back in clinic today with her 3 original wounds. The area on the lateral foot has closed. 12/17; 2-week follow-up. She is completing her Levaquin should have another 2 days. No additional antibiotics are felt to be necessary. The tunneling area to the lateral part of her foot is closed over. Of the 3 wounds we have been dealing with the area over the met head is fully epithelialized. The middle area it is still eschared and the major distal area is heavily surrounded by thick callus 1/6; she completed her antibiotics has not been here in 3 weeks however. We have been using silver alginate. 2 remaining wounds on the plantar foot she is a diabetic. She does not have arterial issues. MRI did not show osteomyelitis but extensive cellulitis and soft tissue infection. 1/27; 3-week follow-up. She has home health who called [place] to report that her medications were mixed up she apparently has rectified the situation. We have been using silver collagen to the 2 remaining areas on her foot 2/11; the patient is down to the 1 wound most towards her plantar heel. The rest of this is closed. She has been using silver collagen and changing the dressing herself 2/25; the area towards her plantar heel in the midfoot is much better much smaller. She has been using silver collagen she has home health coming up to change the dressing Monday Wednesday and she is seen here on Friday. Doing remarkably well 3/18; midfoot plantar wound. She has been using silver collagen. Came in with the area eschared and I thought she actually might be healed. She also tells [place] since she was last here she was hospitalized for low platelet count. 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 All that is remaining of  the 3 large areas she had. She has been using silver collagen. 4/15; midfoot plantar wound on the left foot. All of this is totally closed she has had some callus on this wound that I gently explored but there is nothing open here. She has a remote right BKA and unfortunately the left plantar  foot wounds which were initially I think related to deep tissue infection in her foot have prevented her from getting a prosthesis. We had problems with foot infections here it took a long time to get the 3 wounds to close the last one was the more proximal one had considerable depth at 1 point Objective Constitutional Sitting or standing Blood Pressure is within target range for patient.. Pulse regular and within target range for patient.Marland Kitchen Respirations regular, non-labored and within target range.. Temperature is normal and within the target range for the patient.Marland Kitchen Appears in no distress. Vitals Time Taken: 3:30 PM, Height: 67 in, Weight: 200 lbs, BMI: 31.3, Temperature: 98.2 F, Pulse: 82 bpm, Respiratory Rate: 16 breaths/min, Blood Pressure: 130/82 mmHg. General Notes: Wound exam; plantar midfoot. She has some callus around the wound I explored this there is nothing open here. Everything is closed Integumentary (Hair, Skin) Wound #1 status is Healed - Epithelialized. Original cause of wound was Blister. The date acquired was: 08/02/2019. The wound has been in treatment 33 weeks. The wound is located on the Left,Proximal,Plantar Foot. The wound measures 0cm length x 0cm width x 0cm depth; 0cm^2 area and 0cm^3 volume. General Notes: callous over wound bed. Assessment Active Problems ICD-10 Type 2 diabetes mellitus with foot ulcer Non-pressure chronic ulcer of other part of left foot with necrosis of muscle Acquired absence of right leg below knee Other chronic osteomyelitis, left ankle and foot Plan Discharge From [facility] Services: Discharge from [place] Edema Control - Lymphedema / SCD /  Other: Elevate legs to the level of the heart or above for 30 minutes daily and/or when sitting, a frequency of: - throughout the day Moisturize legs daily. - Lotion to feet and legs Off-Loading: Other: - Pad bottom of foot with foam border (change every few days) and use shoe inserts for protection. Home Health: [facility] for wound care. 1. The patient's wounds are closed on the left foot. This took many months 2. Home health can be discharged 3. I recommended keeping a border foam on the most difficult proximal wound on the plantar foot but other than that no restrictions 4. She has been talking about her right below-knee prosthesis although I am not exactly sure why she did not get this before 5. She is discharged from the clinic Electronic Signature(s) Signed: 04/18/2020 4:57:27 PM By: Linton Ham MD Entered By: Linton Ham on 04/18/2020 15:54:34 -------------------------------------------------------------------------------- SuperBill Details Patient Name: Date of Service: [name] 04/18/2020 Medical Record Number: [id number] Patient Account Number: [id number] Date of Birth/Sex: Treating RN: 1965-11-24 (55 y.o. 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

## 2020-04-21 ENCOUNTER — Inpatient Hospital Stay: Payer: Medicare Other

## 2020-04-22 ENCOUNTER — Inpatient Hospital Stay: Payer: Medicare Other

## 2020-04-22 ENCOUNTER — Other Ambulatory Visit: Payer: Self-pay

## 2020-04-22 VITALS — BP 128/62 | HR 72 | Temp 98.2°F | Resp 18

## 2020-04-22 DIAGNOSIS — Z79899 Other long term (current) drug therapy: Secondary | ICD-10-CM | POA: Diagnosis not present

## 2020-04-22 DIAGNOSIS — D693 Immune thrombocytopenic purpura: Secondary | ICD-10-CM

## 2020-04-22 DIAGNOSIS — D696 Thrombocytopenia, unspecified: Secondary | ICD-10-CM

## 2020-04-22 LAB — CBC WITH DIFFERENTIAL (CANCER CENTER ONLY)
Abs Immature Granulocytes: 0.02 10*3/uL (ref 0.00–0.07)
Basophils Absolute: 0 10*3/uL (ref 0.0–0.1)
Basophils Relative: 0 %
Eosinophils Absolute: 0.1 10*3/uL (ref 0.0–0.5)
Eosinophils Relative: 2 %
HCT: 34.9 % — ABNORMAL LOW (ref 36.0–46.0)
Hemoglobin: 10.6 g/dL — ABNORMAL LOW (ref 12.0–15.0)
Immature Granulocytes: 0 %
Lymphocytes Relative: 16 %
Lymphs Abs: 0.7 10*3/uL (ref 0.7–4.0)
MCH: 28.5 pg (ref 26.0–34.0)
MCHC: 30.4 g/dL (ref 30.0–36.0)
MCV: 93.8 fL (ref 80.0–100.0)
Monocytes Absolute: 0.4 10*3/uL (ref 0.1–1.0)
Monocytes Relative: 9 %
Neutro Abs: 3.3 10*3/uL (ref 1.7–7.7)
Neutrophils Relative %: 73 %
Platelet Count: 6 10*3/uL — CL (ref 150–400)
RBC: 3.72 MIL/uL — ABNORMAL LOW (ref 3.87–5.11)
RDW: 14.9 % (ref 11.5–15.5)
WBC Count: 4.5 10*3/uL (ref 4.0–10.5)
nRBC: 0 % (ref 0.0–0.2)

## 2020-04-22 LAB — CMP (CANCER CENTER ONLY)
ALT: 17 U/L (ref 0–44)
AST: 24 U/L (ref 15–41)
Albumin: 3.9 g/dL (ref 3.5–5.0)
Alkaline Phosphatase: 88 U/L (ref 38–126)
Anion gap: 11 (ref 5–15)
BUN: 39 mg/dL — ABNORMAL HIGH (ref 6–20)
CO2: 23 mmol/L (ref 22–32)
Calcium: 8.6 mg/dL — ABNORMAL LOW (ref 8.9–10.3)
Chloride: 101 mmol/L (ref 98–111)
Creatinine: 1.7 mg/dL — ABNORMAL HIGH (ref 0.44–1.00)
GFR, Estimated: 36 mL/min — ABNORMAL LOW (ref >60)
Glucose, Bld: 156 mg/dL — ABNORMAL HIGH (ref 70–99)
Potassium: 5.8 mmol/L — ABNORMAL HIGH (ref 3.5–5.1)
Sodium: 135 mmol/L (ref 135–145)
Total Bilirubin: 0.4 mg/dL (ref 0.3–1.2)
Total Protein: 7.8 g/dL (ref 6.5–8.1)

## 2020-04-22 MED ORDER — ROMIPLOSTIM INJECTION 500 MCG
500.0000 ug | Freq: Once | SUBCUTANEOUS | Status: AC
  Administered 2020-04-22: 500 ug via SUBCUTANEOUS
  Filled 2020-04-22: qty 1

## 2020-04-22 NOTE — Progress Notes (Unsigned)
Patient was offered appointment options to receive IVIG from our Assistant Director, per MD recommendations.  Patient declined due to prior appointments/transportation.    MD notified, MD recommendations to have patient scheduled for as soon as patient can come in.   High Priority scheduling message sent.

## 2020-04-22 NOTE — Progress Notes (Signed)
CRITICAL VALUE STICKER  CRITICAL VALUE: Platelets 6  MD NOTIFIED: Dr. Darnelle Catalan   RESPONSE: Pt to receive IVIG 1g/kg.  MD will assess patient in treatment area while she is receiving NPlate today. Urgent request placed to have patient scheduled for IVIG.  Will update patient once scheduled.

## 2020-04-23 ENCOUNTER — Ambulatory Visit: Payer: Medicare Other

## 2020-04-23 ENCOUNTER — Other Ambulatory Visit: Payer: Medicare Other

## 2020-04-25 ENCOUNTER — Telehealth: Payer: Self-pay | Admitting: Hematology and Oncology

## 2020-04-25 NOTE — Telephone Encounter (Signed)
Scheduled appt per 4/19 sch msg. Called pt, no answer. Left msg with appt date and time.  

## 2020-04-30 ENCOUNTER — Other Ambulatory Visit: Payer: Self-pay | Admitting: *Deleted

## 2020-04-30 DIAGNOSIS — D693 Immune thrombocytopenic purpura: Secondary | ICD-10-CM

## 2020-05-01 ENCOUNTER — Inpatient Hospital Stay: Payer: Medicare Other

## 2020-05-01 ENCOUNTER — Encounter: Payer: Self-pay | Admitting: *Deleted

## 2020-05-01 ENCOUNTER — Other Ambulatory Visit: Payer: Self-pay

## 2020-05-01 VITALS — BP 131/64 | HR 73 | Temp 98.1°F | Resp 17

## 2020-05-01 DIAGNOSIS — Z79899 Other long term (current) drug therapy: Secondary | ICD-10-CM | POA: Diagnosis not present

## 2020-05-01 DIAGNOSIS — D693 Immune thrombocytopenic purpura: Secondary | ICD-10-CM

## 2020-05-01 LAB — CBC WITH DIFFERENTIAL (CANCER CENTER ONLY)
Abs Immature Granulocytes: 0.02 10*3/uL (ref 0.00–0.07)
Basophils Absolute: 0 10*3/uL (ref 0.0–0.1)
Basophils Relative: 1 %
Eosinophils Absolute: 0.1 10*3/uL (ref 0.0–0.5)
Eosinophils Relative: 3 %
HCT: 33.3 % — ABNORMAL LOW (ref 36.0–46.0)
Hemoglobin: 10.6 g/dL — ABNORMAL LOW (ref 12.0–15.0)
Immature Granulocytes: 1 %
Lymphocytes Relative: 20 %
Lymphs Abs: 0.8 10*3/uL (ref 0.7–4.0)
MCH: 29.1 pg (ref 26.0–34.0)
MCHC: 31.8 g/dL (ref 30.0–36.0)
MCV: 91.5 fL (ref 80.0–100.0)
Monocytes Absolute: 0.4 10*3/uL (ref 0.1–1.0)
Monocytes Relative: 11 %
Neutro Abs: 2.6 10*3/uL (ref 1.7–7.7)
Neutrophils Relative %: 64 %
Platelet Count: 8 10*3/uL — CL (ref 150–400)
RBC: 3.64 MIL/uL — ABNORMAL LOW (ref 3.87–5.11)
RDW: 16 % — ABNORMAL HIGH (ref 11.5–15.5)
WBC Count: 3.9 10*3/uL — ABNORMAL LOW (ref 4.0–10.5)
nRBC: 0 % (ref 0.0–0.2)

## 2020-05-01 LAB — CMP (CANCER CENTER ONLY)
ALT: 18 U/L (ref 0–44)
AST: 31 U/L (ref 15–41)
Albumin: 3.9 g/dL (ref 3.5–5.0)
Alkaline Phosphatase: 87 U/L (ref 38–126)
Anion gap: 13 (ref 5–15)
BUN: 28 mg/dL — ABNORMAL HIGH (ref 6–20)
CO2: 23 mmol/L (ref 22–32)
Calcium: 8.6 mg/dL — ABNORMAL LOW (ref 8.9–10.3)
Chloride: 99 mmol/L (ref 98–111)
Creatinine: 1.39 mg/dL — ABNORMAL HIGH (ref 0.44–1.00)
GFR, Estimated: 45 mL/min — ABNORMAL LOW (ref >60)
Glucose, Bld: 164 mg/dL — ABNORMAL HIGH (ref 70–99)
Potassium: 5.1 mmol/L (ref 3.5–5.1)
Sodium: 135 mmol/L (ref 135–145)
Total Bilirubin: 0.5 mg/dL (ref 0.3–1.2)
Total Protein: 7.7 g/dL (ref 6.5–8.1)

## 2020-05-01 MED ORDER — ROMIPLOSTIM INJECTION 500 MCG
600.0000 ug | Freq: Once | SUBCUTANEOUS | Status: AC
  Administered 2020-05-01: 600 ug via SUBCUTANEOUS
  Filled 2020-05-01: qty 1

## 2020-05-01 NOTE — Progress Notes (Signed)
CRITICAL VALUE STICKER  CRITICAL VALUE: plt <8  Donia Ast, RN  DATE & TIME NOTIFIED: 05/01/20 at 1620   MD NOTIFIED: Serena Croissant, MD  TIME OF NOTIFICATION: 05/01/20 1625  RESPONSE: MD notified and verbalized understanding.  No orders received.

## 2020-05-05 ENCOUNTER — Inpatient Hospital Stay: Payer: Medicare Other | Attending: Hematology and Oncology

## 2020-05-05 ENCOUNTER — Other Ambulatory Visit: Payer: Self-pay

## 2020-05-05 ENCOUNTER — Telehealth: Payer: Self-pay | Admitting: Hematology and Oncology

## 2020-05-05 VITALS — BP 159/78 | HR 88 | Temp 98.2°F | Resp 16 | Wt 224.0 lb

## 2020-05-05 DIAGNOSIS — Z7984 Long term (current) use of oral hypoglycemic drugs: Secondary | ICD-10-CM | POA: Diagnosis not present

## 2020-05-05 DIAGNOSIS — Z79899 Other long term (current) drug therapy: Secondary | ICD-10-CM | POA: Insufficient documentation

## 2020-05-05 DIAGNOSIS — E119 Type 2 diabetes mellitus without complications: Secondary | ICD-10-CM | POA: Insufficient documentation

## 2020-05-05 DIAGNOSIS — D693 Immune thrombocytopenic purpura: Secondary | ICD-10-CM | POA: Diagnosis not present

## 2020-05-05 MED ORDER — IMMUNE GLOBULIN (HUMAN) 10 GM/100ML IV SOLN
100.0000 g | Freq: Once | INTRAVENOUS | Status: AC
Start: 2020-05-05 — End: 2020-05-05
  Administered 2020-05-05: 100 g via INTRAVENOUS
  Filled 2020-05-05: qty 800

## 2020-05-05 MED ORDER — ROMIPLOSTIM 125 MCG ~~LOC~~ SOLR: 1.0000 ug/kg | Freq: Once | SUBCUTANEOUS | Status: DC

## 2020-05-05 MED ORDER — ACETAMINOPHEN 325 MG PO TABS
650.0000 mg | ORAL_TABLET | Freq: Once | ORAL | Status: AC
  Administered 2020-05-05: 650 mg via ORAL

## 2020-05-05 MED ORDER — DEXTROSE 5 % IV SOLN
Freq: Once | INTRAVENOUS | Status: AC
  Filled 2020-05-05: qty 250

## 2020-05-05 MED ORDER — DIPHENHYDRAMINE HCL 25 MG PO TABS
25.0000 mg | ORAL_TABLET | Freq: Once | ORAL | Status: AC
  Administered 2020-05-05: 25 mg via ORAL
  Filled 2020-05-05: qty 1

## 2020-05-05 MED ORDER — ACETAMINOPHEN 325 MG PO TABS
ORAL_TABLET | ORAL | Status: AC
  Filled 2020-05-05: qty 2

## 2020-05-05 MED ORDER — DIPHENHYDRAMINE HCL 25 MG PO CAPS
ORAL_CAPSULE | ORAL | Status: AC
  Filled 2020-05-05: qty 1

## 2020-05-05 NOTE — Patient Instructions (Signed)
Immune Globulin Injection What is this medicine? IMMUNE GLOBULIN (im MUNE GLOB yoo lin) helps to prevent or reduce the severity of certain infections in patients who are at risk. This medicine is collected from the pooled blood of many donors. It is used to treat immune system problems, thrombocytopenia, and Kawasaki syndrome. This medicine may be used for other purposes; ask your health care provider or pharmacist if you have questions. COMMON BRAND NAME(S): ASCENIV, Baygam, BIVIGAM, Carimune, Carimune NF, cutaquig, Cuvitru, Flebogamma, Flebogamma DIF, GamaSTAN, GamaSTAN S/D, Gamimune N, Gammagard, Gammagard S/D, Gammaked, Gammaplex, Gammar-P IV, Gamunex, Gamunex-C, Hizentra, Iveegam, Iveegam EN, Octagam, Panglobulin, Panglobulin NF, panzyga, Polygam S/D, Privigen, Sandoglobulin, Venoglobulin-S, Vigam, Vivaglobulin, Xembify What should I tell my health care provider before I take this medicine? They need to know if you have any of these conditions:  diabetes  extremely low or no immune antibodies in the blood  heart disease  history of blood clots  hyperprolinemia  infection in the blood, sepsis  kidney disease  recently received or scheduled to receive a vaccination  an unusual or allergic reaction to human immune globulin, albumin, maltose, sucrose, other medicines, foods, dyes, or preservatives  pregnant or trying to get pregnant  breast-feeding How should I use this medicine? This medicine is for injection into a muscle or infusion into a vein or skin. It is usually given by a health care professional in a hospital or clinic setting. In rare cases, some brands of this medicine might be given at home. You will be taught how to give this medicine. Use exactly as directed. Take your medicine at regular intervals. Do not take your medicine more often than directed. Talk to your pediatrician regarding the use of this medicine in children. While this drug may be prescribed for selected  conditions, precautions do apply. Overdosage: If you think you have taken too much of this medicine contact a poison control center or emergency room at once. NOTE: This medicine is only for you. Do not share this medicine with others. What if I miss a dose? It is important not to miss your dose. Call your doctor or health care professional if you are unable to keep an appointment. If you give yourself the medicine and you miss a dose, take it as soon as you can. If it is almost time for your next dose, take only that dose. Do not take double or extra doses. What may interact with this medicine?  aspirin and aspirin-like medicines  cisplatin  cyclosporine  medicines for infection like acyclovir, adefovir, amphotericin B, bacitracin, cidofovir, foscarnet, ganciclovir, gentamicin, pentamidine, vancomycin  NSAIDS, medicines for pain and inflammation, like ibuprofen or naproxen  pamidronate  vaccines  zoledronic acid This list may not describe all possible interactions. Give your health care provider a list of all the medicines, herbs, non-prescription drugs, or dietary supplements you use. Also tell them if you smoke, drink alcohol, or use illegal drugs. Some items may interact with your medicine. What should I watch for while using this medicine? Your condition will be monitored carefully while you are receiving this medicine. This medicine is made from pooled blood donations of many different people. It may be possible to pass an infection in this medicine. However, the donors are screened for infections and all products are tested for HIV and hepatitis. The medicine is treated to kill most or all bacteria and viruses. Talk to your doctor about the risks and benefits of this medicine. Do not have vaccinations for at least   14 days before, or until at least 3 months after receiving this medicine. What side effects may I notice from receiving this medicine? Side effects that you should report  to your doctor or health care professional as soon as possible:  allergic reactions like skin rash, itching or hives, swelling of the face, lips, or tongue  blue colored lips or skin  breathing problems  chest pain or tightness  fever  signs and symptoms of aseptic meningitis such as stiff neck; sensitivity to light; headache; drowsiness; fever; nausea; vomiting; rash  signs and symptoms of a blood clot such as chest pain; shortness of breath; pain, swelling, or warmth in the leg  signs and symptoms of hemolytic anemia such as fast heartbeat; tiredness; dark yellow or brown urine; or yellowing of the eyes or skin  signs and symptoms of kidney injury like trouble passing urine or change in the amount of urine  sudden weight gain  swelling of the ankles, feet, hands Side effects that usually do not require medical attention (report to your doctor or health care professional if they continue or are bothersome):  diarrhea  flushing  headache  increased sweating  joint pain  muscle cramps  muscle pain  nausea  pain, redness, or irritation at site where injected  tiredness This list may not describe all possible side effects. Call your doctor for medical advice about side effects. You may report side effects to FDA at 1-800-FDA-1088. Where should I keep my medicine? Keep out of the reach of children. This drug is usually given in a hospital or clinic and will not be stored at home. In rare cases, some brands of this medicine may be given at home. If you are using this medicine at home, you will be instructed on how to store this medicine. Throw away any unused medicine after the expiration date on the label. NOTE: This sheet is a summary. It may not cover all possible information. If you have questions about this medicine, talk to your doctor, pharmacist, or health care provider.  2021 [employer] Standard (2018-07-26 12:51:14)  

## 2020-05-05 NOTE — Telephone Encounter (Signed)
Scheduled appt per 5/2 sch msg. Called pt, no answer. Left msg with appt date and time.  

## 2020-05-07 ENCOUNTER — Inpatient Hospital Stay: Payer: Medicare Other

## 2020-05-07 ENCOUNTER — Other Ambulatory Visit: Payer: Self-pay | Admitting: *Deleted

## 2020-05-07 DIAGNOSIS — D693 Immune thrombocytopenic purpura: Secondary | ICD-10-CM

## 2020-05-08 ENCOUNTER — Other Ambulatory Visit: Payer: Self-pay

## 2020-05-08 ENCOUNTER — Inpatient Hospital Stay: Payer: Medicare Other

## 2020-05-08 ENCOUNTER — Other Ambulatory Visit: Payer: Self-pay | Admitting: *Deleted

## 2020-05-08 VITALS — BP 143/71 | HR 73 | Temp 98.2°F | Resp 17

## 2020-05-08 DIAGNOSIS — Z79899 Other long term (current) drug therapy: Secondary | ICD-10-CM | POA: Diagnosis not present

## 2020-05-08 DIAGNOSIS — D693 Immune thrombocytopenic purpura: Secondary | ICD-10-CM | POA: Diagnosis not present

## 2020-05-08 DIAGNOSIS — E119 Type 2 diabetes mellitus without complications: Secondary | ICD-10-CM | POA: Diagnosis not present

## 2020-05-08 DIAGNOSIS — Z7984 Long term (current) use of oral hypoglycemic drugs: Secondary | ICD-10-CM | POA: Diagnosis not present

## 2020-05-08 LAB — CBC WITH DIFFERENTIAL (CANCER CENTER ONLY)
Abs Immature Granulocytes: 0.01 10*3/uL (ref 0.00–0.07)
Basophils Absolute: 0 10*3/uL (ref 0.0–0.1)
Basophils Relative: 1 %
Eosinophils Absolute: 0 10*3/uL (ref 0.0–0.5)
Eosinophils Relative: 1 %
HCT: 31 % — ABNORMAL LOW (ref 36.0–46.0)
Hemoglobin: 9.6 g/dL — ABNORMAL LOW (ref 12.0–15.0)
Immature Granulocytes: 0 %
Lymphocytes Relative: 20 %
Lymphs Abs: 0.6 10*3/uL — ABNORMAL LOW (ref 0.7–4.0)
MCH: 28.9 pg (ref 26.0–34.0)
MCHC: 31 g/dL (ref 30.0–36.0)
MCV: 93.4 fL (ref 80.0–100.0)
Monocytes Absolute: 0.5 10*3/uL (ref 0.1–1.0)
Monocytes Relative: 15 %
Neutro Abs: 1.9 10*3/uL (ref 1.7–7.7)
Neutrophils Relative %: 63 %
Platelet Count: 141 10*3/uL — ABNORMAL LOW (ref 150–400)
RBC: 3.32 MIL/uL — ABNORMAL LOW (ref 3.87–5.11)
RDW: 16.1 % — ABNORMAL HIGH (ref 11.5–15.5)
WBC Count: 3.1 10*3/uL — ABNORMAL LOW (ref 4.0–10.5)
nRBC: 0 % (ref 0.0–0.2)

## 2020-05-08 LAB — CMP (CANCER CENTER ONLY)
ALT: 11 U/L (ref 0–44)
AST: 22 U/L (ref 15–41)
Albumin: 3.5 g/dL (ref 3.5–5.0)
Alkaline Phosphatase: 81 U/L (ref 38–126)
Anion gap: 9 (ref 5–15)
BUN: 34 mg/dL — ABNORMAL HIGH (ref 6–20)
CO2: 20 mmol/L — ABNORMAL LOW (ref 22–32)
Calcium: 8.6 mg/dL — ABNORMAL LOW (ref 8.9–10.3)
Chloride: 103 mmol/L (ref 98–111)
Creatinine: 1.51 mg/dL — ABNORMAL HIGH (ref 0.44–1.00)
GFR, Estimated: 41 mL/min — ABNORMAL LOW (ref >60)
Glucose, Bld: 133 mg/dL — ABNORMAL HIGH (ref 70–99)
Potassium: 5.6 mmol/L — ABNORMAL HIGH (ref 3.5–5.1)
Sodium: 132 mmol/L — ABNORMAL LOW (ref 135–145)
Total Bilirubin: 0.4 mg/dL (ref 0.3–1.2)
Total Protein: 8.4 g/dL — ABNORMAL HIGH (ref 6.5–8.1)

## 2020-05-08 MED ORDER — FAMOTIDINE 20 MG IN NS 100 ML IVPB: 20.0000 mg | Freq: Once | INTRAVENOUS | Status: DC | PRN

## 2020-05-08 MED ORDER — DIPHENHYDRAMINE HCL 25 MG PO CAPS
25.0000 mg | ORAL_CAPSULE | Freq: Once | ORAL | Status: AC
Start: 2020-05-08 — End: 2020-05-08
  Administered 2020-05-08: 25 mg via ORAL

## 2020-05-08 MED ORDER — ROMIPLOSTIM INJECTION 500 MCG
600.0000 ug | Freq: Once | SUBCUTANEOUS | Status: DC
  Filled 2020-05-08: qty 1.2

## 2020-05-08 MED ORDER — DIPHENHYDRAMINE HCL 50 MG/ML IJ SOLN: 50.0000 mg | Freq: Once | INTRAMUSCULAR | Status: DC | PRN

## 2020-05-08 MED ORDER — SODIUM CHLORIDE 0.9% FLUSH
3.0000 mL | Freq: Once | INTRAVENOUS | Status: DC | PRN
  Filled 2020-05-08: qty 10

## 2020-05-08 MED ORDER — HEPARIN SOD (PORK) LOCK FLUSH 100 UNIT/ML IV SOLN
250.0000 [IU] | Freq: Once | INTRAVENOUS | Status: DC | PRN
  Filled 2020-05-08: qty 5

## 2020-05-08 MED ORDER — DIPHENHYDRAMINE HCL 25 MG PO CAPS
ORAL_CAPSULE | ORAL | Status: AC
  Filled 2020-05-08: qty 1

## 2020-05-08 MED ORDER — ALBUTEROL SULFATE (2.5 MG/3ML) 0.083% IN NEBU
2.5000 mg | INHALATION_SOLUTION | Freq: Once | RESPIRATORY_TRACT | Status: DC | PRN
  Filled 2020-05-08: qty 3

## 2020-05-08 MED ORDER — EPINEPHRINE 0.3 MG/0.3ML IJ SOAJ: 0.3000 mg | Freq: Once | INTRAMUSCULAR | Status: DC | PRN

## 2020-05-08 MED ORDER — IMMUNE GLOBULIN (HUMAN) 10 GM/100ML IV SOLN
1.0000 g/kg | INTRAVENOUS | Status: DC
  Administered 2020-05-08: 100 g via INTRAVENOUS
  Filled 2020-05-08: qty 800

## 2020-05-08 MED ORDER — ALTEPLASE 2 MG IJ SOLR
2.0000 mg | Freq: Once | INTRAMUSCULAR | Status: DC | PRN
  Filled 2020-05-08: qty 2

## 2020-05-08 MED ORDER — ACETAMINOPHEN 325 MG PO TABS
ORAL_TABLET | ORAL | Status: AC
  Filled 2020-05-08: qty 2

## 2020-05-08 MED ORDER — SODIUM CHLORIDE 0.9% FLUSH
10.0000 mL | Freq: Once | INTRAVENOUS | Status: DC | PRN
  Filled 2020-05-08: qty 10

## 2020-05-08 MED ORDER — DIPHENHYDRAMINE HCL 25 MG PO CAPS: 25.0000 mg | ORAL_CAPSULE | Freq: Once | ORAL | Status: DC

## 2020-05-08 MED ORDER — METHYLPREDNISOLONE SODIUM SUCC 125 MG IJ SOLR: 125.0000 mg | Freq: Once | INTRAMUSCULAR | Status: DC | PRN

## 2020-05-08 MED ORDER — HEPARIN SOD (PORK) LOCK FLUSH 100 UNIT/ML IV SOLN
500.0000 [IU] | Freq: Once | INTRAVENOUS | Status: DC | PRN
  Filled 2020-05-08: qty 5

## 2020-05-08 MED ORDER — ACETAMINOPHEN 325 MG PO TABS
650.0000 mg | ORAL_TABLET | Freq: Once | ORAL | Status: AC
  Administered 2020-05-08: 650 mg via ORAL

## 2020-05-08 MED ORDER — ACETAMINOPHEN 325 MG PO TABS: 650.0000 mg | ORAL_TABLET | Freq: Once | ORAL | Status: DC

## 2020-05-08 MED ORDER — SODIUM CHLORIDE 0.9 % IV SOLN
Freq: Once | INTRAVENOUS | Status: DC | PRN
  Filled 2020-05-08: qty 250

## 2020-05-08 NOTE — Patient Instructions (Signed)
Immune Globulin Injection What is this medicine? IMMUNE GLOBULIN (im MUNE GLOB yoo lin) helps to prevent or reduce the severity of certain infections in patients who are at risk. This medicine is collected from the pooled blood of many donors. It is used to treat immune system problems, thrombocytopenia, and Kawasaki syndrome. This medicine may be used for other purposes; ask your health care provider or pharmacist if you have questions. COMMON BRAND NAME(S): ASCENIV, Baygam, BIVIGAM, Carimune, Carimune NF, cutaquig, Cuvitru, Flebogamma, Flebogamma DIF, GamaSTAN, GamaSTAN S/D, Gamimune N, Gammagard, Gammagard S/D, Gammaked, Gammaplex, Gammar-P IV, Gamunex, Gamunex-C, Hizentra, Iveegam, Iveegam EN, Octagam, Panglobulin, Panglobulin NF, panzyga, Polygam S/D, Privigen, Sandoglobulin, Venoglobulin-S, Vigam, Vivaglobulin, Xembify What should I tell my health care provider before I take this medicine? They need to know if you have any of these conditions:  diabetes  extremely low or no immune antibodies in the blood  heart disease  history of blood clots  hyperprolinemia  infection in the blood, sepsis  kidney disease  recently received or scheduled to receive a vaccination  an unusual or allergic reaction to human immune globulin, albumin, maltose, sucrose, other medicines, foods, dyes, or preservatives  pregnant or trying to get pregnant  breast-feeding How should I use this medicine? This medicine is for injection into a muscle or infusion into a vein or skin. It is usually given by a health care professional in a hospital or clinic setting. In rare cases, some brands of this medicine might be given at home. You will be taught how to give this medicine. Use exactly as directed. Take your medicine at regular intervals. Do not take your medicine more often than directed. Talk to your pediatrician regarding the use of this medicine in children. While this drug may be prescribed for selected  conditions, precautions do apply. Overdosage: If you think you have taken too much of this medicine contact a poison control center or emergency room at once. NOTE: This medicine is only for you. Do not share this medicine with others. What if I miss a dose? It is important not to miss your dose. Call your doctor or health care professional if you are unable to keep an appointment. If you give yourself the medicine and you miss a dose, take it as soon as you can. If it is almost time for your next dose, take only that dose. Do not take double or extra doses. What may interact with this medicine?  aspirin and aspirin-like medicines  cisplatin  cyclosporine  medicines for infection like acyclovir, adefovir, amphotericin B, bacitracin, cidofovir, foscarnet, ganciclovir, gentamicin, pentamidine, vancomycin  NSAIDS, medicines for pain and inflammation, like ibuprofen or naproxen  pamidronate  vaccines  zoledronic acid This list may not describe all possible interactions. Give your health care provider a list of all the medicines, herbs, non-prescription drugs, or dietary supplements you use. Also tell them if you smoke, drink alcohol, or use illegal drugs. Some items may interact with your medicine. What should I watch for while using this medicine? Your condition will be monitored carefully while you are receiving this medicine. This medicine is made from pooled blood donations of many different people. It may be possible to pass an infection in this medicine. However, the donors are screened for infections and all products are tested for HIV and hepatitis. The medicine is treated to kill most or all bacteria and viruses. Talk to your doctor about the risks and benefits of this medicine. Do not have vaccinations for at least   14 days before, or until at least 3 months after receiving this medicine. What side effects may I notice from receiving this medicine? Side effects that you should report  to your doctor or health care professional as soon as possible:  allergic reactions like skin rash, itching or hives, swelling of the face, lips, or tongue  blue colored lips or skin  breathing problems  chest pain or tightness  fever  signs and symptoms of aseptic meningitis such as stiff neck; sensitivity to light; headache; drowsiness; fever; nausea; vomiting; rash  signs and symptoms of a blood clot such as chest pain; shortness of breath; pain, swelling, or warmth in the leg  signs and symptoms of hemolytic anemia such as fast heartbeat; tiredness; dark yellow or brown urine; or yellowing of the eyes or skin  signs and symptoms of kidney injury like trouble passing urine or change in the amount of urine  sudden weight gain  swelling of the ankles, feet, hands Side effects that usually do not require medical attention (report to your doctor or health care professional if they continue or are bothersome):  diarrhea  flushing  headache  increased sweating  joint pain  muscle cramps  muscle pain  nausea  pain, redness, or irritation at site where injected  tiredness This list may not describe all possible side effects. Call your doctor for medical advice about side effects. You may report side effects to FDA at 1-800-FDA-1088. Where should I keep my medicine? Keep out of the reach of children. This drug is usually given in a hospital or clinic and will not be stored at home. In rare cases, some brands of this medicine may be given at home. If you are using this medicine at home, you will be instructed on how to store this medicine. Throw away any unused medicine after the expiration date on the label. NOTE: This sheet is a summary. It may not cover all possible information. If you have questions about this medicine, talk to your doctor, pharmacist, or health care provider.  2021 [employer] Standard (2018-07-26 12:51:14)  

## 2020-05-13 DIAGNOSIS — E1151 Type 2 diabetes mellitus with diabetic peripheral angiopathy without gangrene: Secondary | ICD-10-CM | POA: Diagnosis not present

## 2020-05-13 DIAGNOSIS — E1169 Type 2 diabetes mellitus with other specified complication: Secondary | ICD-10-CM | POA: Diagnosis not present

## 2020-05-13 DIAGNOSIS — I081 Rheumatic disorders of both mitral and tricuspid valves: Secondary | ICD-10-CM | POA: Diagnosis not present

## 2020-05-13 DIAGNOSIS — E871 Hypo-osmolality and hyponatremia: Secondary | ICD-10-CM | POA: Diagnosis not present

## 2020-05-13 DIAGNOSIS — I890 Lymphedema, not elsewhere classified: Secondary | ICD-10-CM | POA: Diagnosis not present

## 2020-05-13 DIAGNOSIS — E1122 Type 2 diabetes mellitus with diabetic chronic kidney disease: Secondary | ICD-10-CM | POA: Diagnosis not present

## 2020-05-13 DIAGNOSIS — E785 Hyperlipidemia, unspecified: Secondary | ICD-10-CM | POA: Diagnosis not present

## 2020-05-13 DIAGNOSIS — M86672 Other chronic osteomyelitis, left ankle and foot: Secondary | ICD-10-CM | POA: Diagnosis not present

## 2020-05-13 DIAGNOSIS — J9611 Chronic respiratory failure with hypoxia: Secondary | ICD-10-CM | POA: Diagnosis not present

## 2020-05-13 DIAGNOSIS — N1832 Chronic kidney disease, stage 3b: Secondary | ICD-10-CM | POA: Diagnosis not present

## 2020-05-13 DIAGNOSIS — E11319 Type 2 diabetes mellitus with unspecified diabetic retinopathy without macular edema: Secondary | ICD-10-CM | POA: Diagnosis not present

## 2020-05-13 DIAGNOSIS — E1140 Type 2 diabetes mellitus with diabetic neuropathy, unspecified: Secondary | ICD-10-CM | POA: Diagnosis not present

## 2020-05-13 DIAGNOSIS — I5032 Chronic diastolic (congestive) heart failure: Secondary | ICD-10-CM | POA: Diagnosis not present

## 2020-05-13 DIAGNOSIS — D693 Immune thrombocytopenic purpura: Secondary | ICD-10-CM | POA: Diagnosis not present

## 2020-05-13 DIAGNOSIS — D631 Anemia in chronic kidney disease: Secondary | ICD-10-CM | POA: Diagnosis not present

## 2020-05-13 DIAGNOSIS — I130 Hypertensive heart and chronic kidney disease with heart failure and stage 1 through stage 4 chronic kidney disease, or unspecified chronic kidney disease: Secondary | ICD-10-CM | POA: Diagnosis not present

## 2020-05-14 NOTE — Progress Notes (Incomplete)
Patient Care Team: Patient, No Pcp Per (Inactive) as PCP - General (General Practice)  DIAGNOSIS: No diagnosis found.  CHIEF COMPLIANT: Follow-up of ITP  INTERVAL HISTORY: [name] is a 55 y.o. with above-mentioned history of ITPpreviously treated withIVIGand currently on weekly Nplate treatment.Labs on 05/08/20 showed Hg 9.6, platelets 141.She presentsto the clinictodayforfollow-up.  ALLERGIES:  has No Known Allergies.  MEDICATIONS:  Current Outpatient Medications  Medication Sig Dispense Refill  . acetaminophen (TYLENOL) 500 MG tablet Take 1,000 mg by mouth as needed (pain).    Marland Kitchen amLODipine (NORVASC) 5 MG tablet Take 1 tablet (5 mg total) by mouth daily. 90 tablet 3  . aspirin EC 81 MG tablet Take 81 mg by mouth daily as needed for moderate pain. Swallow whole.    . Blood Glucose Monitoring Suppl (PRODIGY AUTOCODE BLOOD GLUCOSE) w/Device KIT 1 kit by Does not apply route in the morning, at noon, in the evening, and at bedtime. 1 kit 0  . gabapentin (NEURONTIN) 400 MG capsule Take 1 capsule (400 mg total) by mouth 2 (two) times daily. 180 capsule 3  . isosorbide mononitrate (IMDUR) 30 MG 24 hr tablet Take 1 tablet (30 mg total) by mouth daily. 90 tablet 3  . metFORMIN (GLUCOPHAGE) 500 MG tablet Take 1 tablet (500 mg total) by mouth 2 (two) times daily with a meal. 180 tablet 3  . metoprolol succinate (TOPROL-XL) 50 MG 24 hr tablet Take 1 tablet (50 mg total) by mouth daily. Take with or immediately following a meal. 90 tablet 3  . rosuvastatin (CRESTOR) 20 MG tablet Take 1 tablet (20 mg total) by mouth daily. 90 tablet 3   No current facility-administered medications for this visit.    PHYSICAL EXAMINATION: ECOG PERFORMANCE STATUS: {CHL ONC ECOG [phone]}  There were no vitals filed for this visit. There were no vitals filed for this visit.  LABORATORY DATA:  I have reviewed the data as listed CMP Latest Ref Rng & Units 05/08/2020 05/01/2020 04/22/2020   Glucose 70 - 99 mg/dL 133(H) 164(H) 156(H)  BUN 6 - 20 mg/dL 34(H) 28(H) 39(H)  Creatinine 0.44 - 1.00 mg/dL 1.51(H) 1.39(H) 1.70(H)  Sodium 135 - 145 mmol/L 132(L) 135 135  Potassium 3.5 - 5.1 mmol/L 5.6(H) 5.1 5.8(H)  Chloride 98 - 111 mmol/L 103 99 101  CO2 22 - 32 mmol/L 20(L) 23 23  Calcium 8.9 - 10.3 mg/dL 8.6(L) 8.6(L) 8.6(L)  Total Protein 6.5 - 8.1 g/dL 8.4(H) 7.7 7.8  Total Bilirubin 0.3 - 1.2 mg/dL 0.4 0.5 0.4  Alkaline Phos 38 - 126 U/L 81 87 88  AST 15 - 41 U/L '22 31 24  ' ALT 0 - 44 U/L '11 18 17    ' Lab Results  Component Value Date   WBC 3.1 (L) 05/08/2020   HGB 9.6 (L) 05/08/2020   HCT 31.0 (L) 05/08/2020   MCV 93.4 05/08/2020   PLT 141 (L) 05/08/2020   NEUTROABS 1.9 05/08/2020    ASSESSMENT & PLAN:  No problem-specific Assessment & Plan notes found for this encounter.    No orders of the defined types were placed in this encounter.  The patient has a good understanding of the overall plan. she agrees with it. she will call with any problems that may develop before the next visit here.  Total time spent: *** mins including face to face time and time spent for planning, charting and coordination of care  Rulon Eisenmenger, MD, MPH 05/14/2020  I, Molly Dorshimer, am acting  as scribe for Dr. Nicholas Lose.  {insert scribe attestation}

## 2020-05-15 ENCOUNTER — Inpatient Hospital Stay: Payer: Medicare Other

## 2020-05-15 ENCOUNTER — Inpatient Hospital Stay: Payer: Medicare Other | Admitting: Hematology and Oncology

## 2020-05-15 NOTE — Assessment & Plan Note (Deleted)
Relapsed Refractory ITP: Relapsing ITP: Patient response to steroids and IVIG but because she is a diabetic we cannot use steroids.Normally responds to IV IG Prior IVIG: March 2021, April 2021, June 2021, Feb 2022 ----------------------------------------------------------------------------------------------------------------------------------------------------------------- Prior treatment:Rituxan weekly x4 started 05/23/2019 (patient missed her treatment on 05/30/2019)  (patient missed multiple appointments)  Hospitalization: 06/25/2019-06/29/2019: Nausea and vomiting Hospitalization 08/02/2019-08/09/2019 cellulitis left foot Hospitalization 03/08/2020-03/11/2020: Relapsed ITP and renal failure: Improved with Nplate and IVIG  08/08/2019: Platelets 301, hemoglobin 8.6 09/05/2019: Platelets 110, hemoglobin 11.5 02/28/2020: Platelets 14, hemoglobin 10.1, creatinine 1.63, potassium 6.3 (this is after 2 doses of IVIG) 03/17/2020:Platelets 74, hemoglobin 9 04/09/2020: Platelets less than 5 (increasing the dosage of Nplate ) 04/17/2020: Platelets 10 (Nplate dosing is being managed by pharmacy) IVIG given May 2022 05/08/2020: Platelets 141  Return to clinic weekly for Nplate injections and labs

## 2020-05-21 ENCOUNTER — Other Ambulatory Visit: Payer: Self-pay

## 2020-05-21 ENCOUNTER — Telehealth: Payer: Self-pay | Admitting: Hematology and Oncology

## 2020-05-21 ENCOUNTER — Inpatient Hospital Stay: Payer: Medicare Other

## 2020-05-21 ENCOUNTER — Inpatient Hospital Stay (HOSPITAL_BASED_OUTPATIENT_CLINIC_OR_DEPARTMENT_OTHER): Payer: Medicare Other | Admitting: Hematology and Oncology

## 2020-05-21 DIAGNOSIS — E119 Type 2 diabetes mellitus without complications: Secondary | ICD-10-CM | POA: Diagnosis not present

## 2020-05-21 DIAGNOSIS — D693 Immune thrombocytopenic purpura: Secondary | ICD-10-CM | POA: Diagnosis not present

## 2020-05-21 DIAGNOSIS — Z79899 Other long term (current) drug therapy: Secondary | ICD-10-CM | POA: Diagnosis not present

## 2020-05-21 DIAGNOSIS — Z7984 Long term (current) use of oral hypoglycemic drugs: Secondary | ICD-10-CM | POA: Diagnosis not present

## 2020-05-21 LAB — CMP (CANCER CENTER ONLY)
ALT: 8 U/L (ref 0–44)
AST: 21 U/L (ref 15–41)
Albumin: 3.5 g/dL (ref 3.5–5.0)
Alkaline Phosphatase: 82 U/L (ref 38–126)
Anion gap: 11 (ref 5–15)
BUN: 31 mg/dL — ABNORMAL HIGH (ref 6–20)
CO2: 23 mmol/L (ref 22–32)
Calcium: 8.6 mg/dL — ABNORMAL LOW (ref 8.9–10.3)
Chloride: 103 mmol/L (ref 98–111)
Creatinine: 1.44 mg/dL — ABNORMAL HIGH (ref 0.44–1.00)
GFR, Estimated: 43 mL/min — ABNORMAL LOW (ref >60)
Glucose, Bld: 130 mg/dL — ABNORMAL HIGH (ref 70–99)
Potassium: 5.6 mmol/L — ABNORMAL HIGH (ref 3.5–5.1)
Sodium: 137 mmol/L (ref 135–145)
Total Bilirubin: 0.4 mg/dL (ref 0.3–1.2)
Total Protein: 8.4 g/dL — ABNORMAL HIGH (ref 6.5–8.1)

## 2020-05-21 LAB — CBC WITH DIFFERENTIAL (CANCER CENTER ONLY)
Abs Immature Granulocytes: 0.01 10*3/uL (ref 0.00–0.07)
Basophils Absolute: 0 10*3/uL (ref 0.0–0.1)
Basophils Relative: 0 %
Eosinophils Absolute: 0 10*3/uL (ref 0.0–0.5)
Eosinophils Relative: 1 %
HCT: 32.9 % — ABNORMAL LOW (ref 36.0–46.0)
Hemoglobin: 10 g/dL — ABNORMAL LOW (ref 12.0–15.0)
Immature Granulocytes: 0 %
Lymphocytes Relative: 21 %
Lymphs Abs: 0.7 10*3/uL (ref 0.7–4.0)
MCH: 28 pg (ref 26.0–34.0)
MCHC: 30.4 g/dL (ref 30.0–36.0)
MCV: 92.2 fL (ref 80.0–100.0)
Monocytes Absolute: 0.4 10*3/uL (ref 0.1–1.0)
Monocytes Relative: 12 %
Neutro Abs: 2.2 10*3/uL (ref 1.7–7.7)
Neutrophils Relative %: 66 %
Platelet Count: 18 10*3/uL — ABNORMAL LOW (ref 150–400)
RBC: 3.57 MIL/uL — ABNORMAL LOW (ref 3.87–5.11)
RDW: 16.4 % — ABNORMAL HIGH (ref 11.5–15.5)
WBC Count: 3.4 10*3/uL — ABNORMAL LOW (ref 4.0–10.5)
nRBC: 0 % (ref 0.0–0.2)

## 2020-05-21 MED ORDER — ROMIPLOSTIM INJECTION 500 MCG
700.0000 ug | Freq: Once | SUBCUTANEOUS | Status: AC
  Administered 2020-05-21: 700 ug via SUBCUTANEOUS
  Filled 2020-05-21: qty 0.5

## 2020-05-21 NOTE — Patient Instructions (Signed)
Romiplostim injection What is this medicine? ROMIPLOSTIM (roe mi PLOE stim) helps your body make more platelets. This medicine is used to treat low platelets caused by chronic idiopathic thrombocytopenic purpura (ITP) or a bone marrow syndrome caused by radiation sickness. This medicine may be used for other purposes; ask your health care provider or pharmacist if you have questions. COMMON BRAND NAME(S): Nplate What should I tell my health care provider before I take this medicine? They need to know if you have any of these conditions:  blood clots  myelodysplastic syndrome  an unusual or allergic reaction to romiplostim, mannitol, other medicines, foods, dyes, or preservatives  pregnant or trying to get pregnant  breast-feeding How should I use this medicine? This medicine is injected under the skin. It is given by a health care provider in a hospital or clinic setting. A special MedGuide will be given to you before each treatment. Be sure to read this information carefully each time. Talk to your health care provider about the use of this medicine in children. While it may be prescribed for children as young as newborns for selected conditions, precautions do apply. Overdosage: If you think you have taken too much of this medicine contact a poison control center or emergency room at once. NOTE: This medicine is only for you. Do not share this medicine with others. What if I miss a dose? Keep appointments for follow-up doses. It is important not to miss your dose. Call your health care provider if you are unable to keep an appointment. What may interact with this medicine? Interactions are not expected. This list may not describe all possible interactions. Give your health care provider a list of all the medicines, herbs, non-prescription drugs, or dietary supplements you use. Also tell them if you smoke, drink alcohol, or use illegal drugs. Some items may interact with your  medicine. What should I watch for while using this medicine? Visit your health care provider for regular checks on your progress. You may need blood work done while you are taking this medicine. Your condition will be monitored carefully while you are receiving this medicine. It is important not to miss any appointments. What side effects may I notice from receiving this medicine? Side effects that you should report to your doctor or health care professional as soon as possible:  allergic reactions (skin rash, itching or hives; swelling of the face, lips, or tongue)  bleeding (bloody or black, tarry stools; red or dark brown urine; spitting up blood or brown material that looks like coffee grounds; red spots on the skin; unusual bruising or bleeding from the eyes, gums, or nose)  blood clot (chest pain; shortness of breath; pain, swelling, or warmth in the leg)  stroke (changes in vision; confusion; trouble speaking or understanding; severe headaches; sudden numbness or weakness of the face, arm or leg; trouble walking; dizziness; loss of balance or coordination) Side effects that usually do not require medical attention (report to your doctor or health care professional if they continue or are bothersome):  diarrhea  dizziness  headache  joint pain  muscle pain  stomach pain  trouble sleeping This list may not describe all possible side effects. Call your doctor for medical advice about side effects. You may report side effects to FDA at 1-800-FDA-1088. Where should I keep my medicine? This medicine is given in a hospital or clinic. It will not be stored at home. NOTE: This sheet is a summary. It may not cover all possible   information. If you have questions about this medicine, talk to your doctor, pharmacist, or health care provider.  2021 Elsevier/Gold Standard (2019-02-05 10:28:13)  

## 2020-05-21 NOTE — Progress Notes (Signed)
Patient Care Team: Patient, No Pcp Per (Inactive) as PCP - General (General Practice)  DIAGNOSIS:  Encounter Diagnosis  Name Primary?  . Idiopathic thrombocytopenic purpura (ITP) (HCC)      CHIEF COMPLIANT: Follow-up of recurrent acute ITP  INTERVAL HISTORY: LABERTA WILBON is a 55 year old above-mentioned extremity current acute ITP who received IVIG treatment along with Nplate.  With IVIG her platelets improved to 141 and today they are back down to 14.  Unfortunately she missed her injection for last week.  So it is not clear whether the drop is because of failure of Nplate or because of her noncompliance.  She does not have any active bleeding symptoms.   ALLERGIES:  has No Known Allergies.  MEDICATIONS:  Current Outpatient Medications  Medication Sig Dispense Refill  . acetaminophen (TYLENOL) 500 MG tablet Take 1,000 mg by mouth as needed (pain).    Marland Kitchen amLODipine (NORVASC) 5 MG tablet Take 1 tablet (5 mg total) by mouth daily. 90 tablet 3  . aspirin EC 81 MG tablet Take 81 mg by mouth daily as needed for moderate pain. Swallow whole.    . Blood Glucose Monitoring Suppl (PRODIGY AUTOCODE BLOOD GLUCOSE) w/Device KIT 1 kit by Does not apply route in the morning, at noon, in the evening, and at bedtime. 1 kit 0  . gabapentin (NEURONTIN) 400 MG capsule Take 1 capsule (400 mg total) by mouth 2 (two) times daily. 180 capsule 3  . isosorbide mononitrate (IMDUR) 30 MG 24 hr tablet Take 1 tablet (30 mg total) by mouth daily. 90 tablet 3  . metFORMIN (GLUCOPHAGE) 500 MG tablet Take 1 tablet (500 mg total) by mouth 2 (two) times daily with a meal. 180 tablet 3  . metoprolol succinate (TOPROL-XL) 50 MG 24 hr tablet Take 1 tablet (50 mg total) by mouth daily. Take with or immediately following a meal. 90 tablet 3  . rosuvastatin (CRESTOR) 20 MG tablet Take 1 tablet (20 mg total) by mouth daily. 90 tablet 3   No current facility-administered medications for this visit.    PHYSICAL  EXAMINATION: ECOG PERFORMANCE STATUS: 1 - Symptomatic but completely ambulatory  Vitals:   05/21/20 1038  BP: (!) 135/58  Pulse: 68  Resp: 15  Temp: 97.9 F (36.6 C)  SpO2: 95%   Filed Weights      LABORATORY DATA:  I have reviewed the data as listed CMP Latest Ref Rng & Units 05/21/2020 05/08/2020 05/01/2020  Glucose 70 - 99 mg/dL 130(H) 133(H) 164(H)  BUN 6 - 20 mg/dL 31(H) 34(H) 28(H)  Creatinine 0.44 - 1.00 mg/dL 1.44(H) 1.51(H) 1.39(H)  Sodium 135 - 145 mmol/L 137 132(L) 135  Potassium 3.5 - 5.1 mmol/L 5.6(H) 5.6(H) 5.1  Chloride 98 - 111 mmol/L 103 103 99  CO2 22 - 32 mmol/L 23 20(L) 23  Calcium 8.9 - 10.3 mg/dL 8.6(L) 8.6(L) 8.6(L)  Total Protein 6.5 - 8.1 g/dL 8.4(H) 8.4(H) 7.7  Total Bilirubin 0.3 - 1.2 mg/dL 0.4 0.4 0.5  Alkaline Phos 38 - 126 U/L 82 81 87  AST 15 - 41 U/L _0 ALT 0 - 44 U/L _1 Lab Results  Component Value Date   WBC 3.4 (L) 05/21/2020   HGB 10.0 (L) 05/21/2020   HCT 32.9 (L) 05/21/2020   MCV 92.2 05/21/2020   PLT 18 (L) 05/21/2020   NEUTROABS 2.2 05/21/2020    ASSESSMENT & PLAN:  Idiopathic thrombocytopenic purpura (ITP) (HCC) Relapsed Refractory ITP: Relapsing  ITP: Patient response to steroids and IVIG but because she is a diabetic we cannot use steroids.Normally responds to IV IG Prior IVIG: March 2021, April 2021, June 2021, Feb 2022 ----------------------------------------------------------------------------------------------------------------------------------------------------------------- Prior treatment:Rituxan weekly x4 started 05/23/2019 (patient missed her treatment on 05/30/2019)  (patient missed multiple appointments)  Hospitalization: 06/25/2019-06/29/2019: Nausea and vomiting Hospitalization 08/02/2019-08/09/2019 cellulitis left foot Hospitalization 03/08/2020-03/11/2020: Relapsed ITP and renal failure: Improved with Nplate and IVIG  01/07/6436: Platelets 301, hemoglobin 8.6 09/05/2019: Platelets 110, hemoglobin  11.5 02/28/2020: Platelets 14, hemoglobin 10.1, creatinine 1.63, potassium 6.3 (this is after 2 doses of IVIG) 03/17/2020:Platelets 74, hemoglobin 9 04/09/2020: Platelets less than 5 (increasing the dosage of Nplate ) 3/77/9396: Platelets 10 (Nplate dosing is being managed by pharmacy): IVIG May 2022 05/08/20: Platelets: 141 05/21/2020: Platelet count 14  I discussed with the patient that it is unclear if the drop in the platelet count is because of missing her Nplate injection for last week or visit because of failure of Nplate.  We will give Nplate today and next week and then reassess in 2 weeks.  If it continues to go down then we will have to give her IVIG and if that brings her platelet count up we will have to consider splenectomy for more long-term control.   Weekly for Nplate injections and in 2 weeks to follow-up with me.   No orders of the defined types were placed in this encounter.  The patient has a good understanding of the overall plan. she agrees with it. she will call with any problems that may develop before the next visit here. Total time spent: 30 mins including face to face time and time spent for planning, charting and co-ordination of care   Harriette Ohara, MD 05/21/20

## 2020-05-21 NOTE — Assessment & Plan Note (Signed)
Relapsed Refractory ITP: Relapsing ITP: Patient response to steroids and IVIG but because she is a diabetic we cannot use steroids.Normally responds to IV IG Prior IVIG: March 2021, April 2021, June 2021, Feb 2022 ----------------------------------------------------------------------------------------------------------------------------------------------------------------- Prior treatment:Rituxan weekly x4 started 05/23/2019 (patient missed her treatment on 05/30/2019)  (patient missed multiple appointments)  Hospitalization: 06/25/2019-06/29/2019: Nausea and vomiting Hospitalization 08/02/2019-08/09/2019 cellulitis left foot Hospitalization 03/08/2020-03/11/2020: Relapsed ITP and renal failure: Improved with Nplate and IVIG  08/08/2019: Platelets 301, hemoglobin 8.6 09/05/2019: Platelets 110, hemoglobin 11.5 02/28/2020: Platelets 14, hemoglobin 10.1, creatinine 1.63, potassium 6.3 (this is after 2 doses of IVIG) 03/17/2020:Platelets 74, hemoglobin 9 04/09/2020: Platelets less than 5 (increasing the dosage of Nplate ) 04/17/2020: Platelets 10 (Nplate dosing is being managed by pharmacy): IVIG May 2022 05/08/20: Platelets: 141  RTC monthly

## 2020-05-21 NOTE — Telephone Encounter (Signed)
Scheduled appointment per 05/18 los. Patient is aware. 

## 2020-05-28 ENCOUNTER — Inpatient Hospital Stay: Payer: Medicare Other

## 2020-05-28 DIAGNOSIS — I509 Heart failure, unspecified: Secondary | ICD-10-CM | POA: Diagnosis not present

## 2020-05-28 DIAGNOSIS — E1165 Type 2 diabetes mellitus with hyperglycemia: Secondary | ICD-10-CM | POA: Diagnosis not present

## 2020-05-28 DIAGNOSIS — I10 Essential (primary) hypertension: Secondary | ICD-10-CM | POA: Diagnosis not present

## 2020-05-28 DIAGNOSIS — L97519 Non-pressure chronic ulcer of other part of right foot with unspecified severity: Secondary | ICD-10-CM | POA: Diagnosis not present

## 2020-05-28 DIAGNOSIS — Z7189 Other specified counseling: Secondary | ICD-10-CM | POA: Diagnosis not present

## 2020-06-04 ENCOUNTER — Inpatient Hospital Stay: Payer: Medicare Other | Attending: Hematology and Oncology

## 2020-06-04 ENCOUNTER — Inpatient Hospital Stay: Payer: Medicare Other

## 2020-06-04 DIAGNOSIS — Z7984 Long term (current) use of oral hypoglycemic drugs: Secondary | ICD-10-CM | POA: Insufficient documentation

## 2020-06-04 DIAGNOSIS — D693 Immune thrombocytopenic purpura: Secondary | ICD-10-CM | POA: Insufficient documentation

## 2020-06-04 DIAGNOSIS — E119 Type 2 diabetes mellitus without complications: Secondary | ICD-10-CM | POA: Insufficient documentation

## 2020-06-04 DIAGNOSIS — Z79899 Other long term (current) drug therapy: Secondary | ICD-10-CM | POA: Insufficient documentation

## 2020-06-04 DIAGNOSIS — Z9081 Acquired absence of spleen: Secondary | ICD-10-CM | POA: Insufficient documentation

## 2020-06-05 ENCOUNTER — Emergency Department (HOSPITAL_COMMUNITY)
Admission: EM | Admit: 2020-06-05 | Discharge: 2020-06-05 | Disposition: A | Discharge status: Home / Self Care | Payer: Medicare Other | Attending: Emergency Medicine | Admitting: Emergency Medicine

## 2020-06-05 ENCOUNTER — Encounter: Payer: Self-pay | Admitting: Hematology and Oncology

## 2020-06-05 ENCOUNTER — Other Ambulatory Visit: Payer: Self-pay

## 2020-06-05 DIAGNOSIS — N183 Chronic kidney disease, stage 3 unspecified: Secondary | ICD-10-CM | POA: Diagnosis not present

## 2020-06-05 DIAGNOSIS — Z7982 Long term (current) use of aspirin: Secondary | ICD-10-CM | POA: Diagnosis not present

## 2020-06-05 DIAGNOSIS — R0902 Hypoxemia: Secondary | ICD-10-CM | POA: Diagnosis not present

## 2020-06-05 DIAGNOSIS — I5031 Acute diastolic (congestive) heart failure: Secondary | ICD-10-CM | POA: Insufficient documentation

## 2020-06-05 DIAGNOSIS — I10 Essential (primary) hypertension: Secondary | ICD-10-CM | POA: Diagnosis not present

## 2020-06-05 DIAGNOSIS — R040 Epistaxis: Secondary | ICD-10-CM | POA: Diagnosis not present

## 2020-06-05 DIAGNOSIS — I130 Hypertensive heart and chronic kidney disease with heart failure and stage 1 through stage 4 chronic kidney disease, or unspecified chronic kidney disease: Secondary | ICD-10-CM | POA: Diagnosis not present

## 2020-06-05 DIAGNOSIS — E1122 Type 2 diabetes mellitus with diabetic chronic kidney disease: Secondary | ICD-10-CM | POA: Diagnosis not present

## 2020-06-05 DIAGNOSIS — R42 Dizziness and giddiness: Secondary | ICD-10-CM | POA: Diagnosis not present

## 2020-06-05 DIAGNOSIS — R58 Hemorrhage, not elsewhere classified: Secondary | ICD-10-CM | POA: Diagnosis not present

## 2020-06-05 MED ORDER — OXYMETAZOLINE HCL 0.05 % NA SOLN
1.0000 | Freq: Once | NASAL | Status: AC
  Administered 2020-06-05: 1 via NASAL
  Filled 2020-06-05: qty 30

## 2020-06-05 MED ORDER — SILVER NITRATE-POT NITRATE 75-25 % EX MISC
1.0000 | Freq: Once | CUTANEOUS | Status: AC
  Administered 2020-06-05: 1 via TOPICAL
  Filled 2020-06-05: qty 1

## 2020-06-05 MED ORDER — LIDOCAINE VISCOUS HCL 2 % MT SOLN
15.0000 mL | Freq: Once | OROMUCOSAL | Status: AC
  Administered 2020-06-05: 15 mL via OROMUCOSAL
  Filled 2020-06-05: qty 15

## 2020-06-05 NOTE — ED Provider Notes (Signed)
[facility] EMERGENCY DEPARTMENT Provider Note   CSN: [id number] Arrival date & time: 06/05/20  1421     History Chief Complaint  Patient presents with  . Epistaxis    [name] is a 55 y.o. female.  The history is provided by the patient.  Epistaxis Location:  L nare Severity:  Moderate Duration:  3 hours Timing:  Constant Progression:  Unchanged Chronicity:  Recurrent Context: aspirin use and nose picking   Context: not anticoagulants, not BiPAP, not bleeding disorder, not drug use, not elevation change, not foreign body, not home oxygen, not hypertension, not recent infection, not thrombocytopenia, not trauma and not weather change   Relieved by:  None tried Worsened by:  Nothing Ineffective treatments:  None tried Associated symptoms: blood in oropharynx   Associated symptoms: no congestion, no cough, no dizziness, no facial pain, no fever, no headaches, no sinus pain, no sneezing, no sore throat and no syncope   Risk factors: frequent nosebleeds   Risk factors: no alcohol use, no allergies, no change in medication, no intranasal steroids and no recent nasal surgery        Past Medical History:  Diagnosis Date  . Acute exacerbation of CHF (congestive heart failure) ([place]) 04/11/2019  . Anemia   . Arthritis    "spine, hands" (11/25/2015)  . Back pain    "all my back; probably 3 times/week" (11/25/2015)  . Chronic indwelling Foley catheter 03/04/2017  . Diabetic foot ulcer associated with type 2 diabetes mellitus ([place])   . Hypertension   . Intracerebral hemorrhage ([place]) As a teenager    States she had burst blood vessel as teenager, now with resultant minor visual field and hearing deficits  . Neuropathy   . Stroke [facility]) ~ 1982   "my feeling on my RLE, right lower eye vision,  & hearing out of my right ear not the same since" (11/25/2015)  . Type II diabetes mellitus [facility])     Patient Active Problem List   Diagnosis Date Noted  .  Chronic ITP (idiopathic thrombocytopenia) (HCC) 03/08/2020  . Acute on chronic respiratory failure with hypoxia ([place]) 03/08/2020  . Cellulitis of foot 08/02/2019  . Nausea and vomiting 06/26/2019  . Hematuria 06/26/2019  . Hypertensive urgency 06/26/2019  . Acute diastolic CHF (congestive heart failure) ([place])   . CHF (congestive heart failure) ([place]) 04/11/2019  . Acute exacerbation of CHF (congestive heart failure) ([place]) 04/11/2019  . Leukopenia 04/11/2019  . History of ITP 04/11/2019  . Idiopathic thrombocytopenic purpura (ITP) (HCC) 11/16/2018  . Onychomycosis of toenail 10/16/2018  . Cataract of both eyes 10/16/2018  . Tinea pedis of left foot 10/16/2018  . Decreased vision in both eyes 10/16/2018  . CKD (chronic kidney disease) stage 3, GFR 30-59 ml/min   . Cellulitis of left lower extremity 01/25/2018  . Hyperkalemia 01/25/2018  . Poor compliance 03/08/2017  . Hypoxia 03/07/2017  . Thyroid nodule 03/07/2017  . Chronic indwelling Foley catheter 03/04/2017  . Type II diabetes mellitus ([place])   . Femur fracture ([place]) 03/02/2017  . Orthostatic dizziness   . Urinary tract infection without hematuria   . Enterococcus faecalis infection   . Near syncope 11/25/2015  . Dehydration 11/25/2015  . Orthostatic hypotension 11/25/2015  . UTI (urinary tract infection) 11/25/2015  . Acute worsening of stage 3 chronic kidney disease ([place]) 09/24/2015  . Diabetes mellitus due to underlying condition with chronic kidney disease, without long-term current use of insulin ([place])   . Fever   .  Status post below knee amputation of right lower extremity ([place])   . Diabetic peripheral neuropathy ([place])   . Neuropathic pain   . Benign essential HTN   . Folliculitis   . Slow transit constipation   . Anemia of chronic disease   . Bilateral hydronephrosis   . Urinary retention   . CKD (chronic kidney disease)   . Uncontrolled type 2 diabetes mellitus with diabetic nephropathy, without long-term current  use of insulin ([place])   . Vasculopathy   . Debility 09/04/2015  . DM type 2 with diabetic peripheral neuropathy ([place])   . S/P BKA (below knee amputation) unilateral ([place])   . Medical non-compliance   . Benign skin lesion of multiple sites   . Tachypnea   . Acute blood loss anemia   . Neurogenic bladder   . Occult blood positive stool   . Rectovaginal fistula   . Controlled diabetes mellitus type 2 with complications ([place])   . Acute on chronic renal failure ([place])   . Hydronephrosis determined by ultrasound   . Papular rash   . Uncontrolled type 2 diabetes mellitus with complication ([place])   . Paresthesia   . Thrombocytopenia ([place]) 08/23/2015  . Pressure ulcer 08/23/2015  . Severe anemia 08/23/2015  . Lower urinary tract infectious disease 08/23/2015  . Absolute anemia 08/23/2015  . Peripheral vascular disease, unspecified ([place]) 04/27/2012  . Unilateral complete BKA ([place]) 07/09/2011  . Gas gangrene ([place]) 07/02/2011  . Sepsis(995.91) 07/02/2011  . Acute kidney injury superimposed on chronic kidney disease ([place]) 07/02/2011  . Diabetes mellitus ([place]) 11/22/2010  . Diabetic foot ulcer associated with type 2 diabetes mellitus ([place])   . Essential hypertension   . Arthritis   . Neuropathy ([place])   . Intracerebral hemorrhage [facility])     Past Surgical History:  Procedure Laterality Date  . AMPUTATION  11/24/2010   Procedure: AMPUTATION FOOT;  Surgeon: Wylene Simmer, MD;  Location: [facility];  Service: Orthopedics;  Laterality: Right;  Right  FOOT TRANS METATARSAL AMPUTATION  . AMPUTATION  07/02/2011   Procedure: AMPUTATION BELOW KNEE;  Surgeon: Wylene Simmer, MD;  Location: [facility];  Service: Orthopedics;  Laterality: Right;  . APPLICATION OF WOUND VAC  07/02/2011   Procedure: APPLICATION OF WOUND VAC;  Surgeon: Wylene Simmer, MD;  Location: [facility];  Service: Orthopedics;  Laterality: Right;  . BRAIN SURGERY  1980's   Previous brain surgery in [place], [place]  . CESAREAN SECTION  2004; 2006  .  COLONOSCOPY N/A 09/17/2015   Procedure: COLONOSCOPY;  Surgeon: Wilford Corner, MD;  Location[facility] ENDOSCOPY;  Service: Endoscopy;  Laterality: N/A;  . ESOPHAGOGASTRODUODENOSCOPY N/A 09/17/2015   Procedure: ESOPHAGOGASTRODUODENOSCOPY (EGD);  Surgeon: Wilford Corner, MD;  Location: [facility] ENDOSCOPY;  Service: Endoscopy;  Laterality: N/A;  . I & D EXTREMITY  11/24/2010   Procedure: IRRIGATION AND DEBRIDEMENT EXTREMITY;  Surgeon: Wylene Simmer, MD;  Location: [facility];  Service: Orthopedics;  Laterality: Left;  Debriedment of left plantar ulcer and trimming of toenails  . I & D EXTREMITY  07/02/2011   Procedure: IRRIGATION AND DEBRIDEMENT EXTREMITY;  Surgeon: Wylene Simmer, MD;  Location: [facility];  Service: Orthopedics;  Laterality: Left;  I & D of Left Foot  . I & D EXTREMITY  07/06/2011   Procedure: IRRIGATION AND DEBRIDEMENT EXTREMITY;  Surgeon: Wylene Simmer, MD;  Location: [place];  Service: Orthopedics;  Laterality: Right;  IRRIGATION/DEBRIDEMENT RIGHT BELOW KNEE AMPUTATION  . TUBAL LIGATION  2006     OB History  No obstetric history on file.     Family History  Problem Relation Age of Onset  . Diabetes Mother   . Hypertension Mother   . Hyperlipidemia Mother   . Diabetes Father   . Cancer Father        prostate  . Hyperlipidemia Father   . Hypertension Father   . Diabetes Brother   . Peripheral Artery Disease Brother        has had toes amputated  . Diabetes Son     Social History   Tobacco Use  . Smoking status: Never Smoker  . Smokeless tobacco: Never Used  Vaping Use  . Vaping Use: Never used  Substance Use Topics  . Alcohol use: Yes    Alcohol/week: 2.0 standard drinks    Types: 2 Glasses of wine per week  . Drug use: No    Home Medications Prior to Admission medications   Medication Sig Start Date End Date Taking? Authorizing Provider  acetaminophen (TYLENOL) 500 MG tablet Take 1,000 mg by mouth as needed (pain).    [provider]  amLODipine (NORVASC) 5 MG tablet  Take 1 tablet (5 mg total) by mouth daily. 01/28/20 01/27/21  Vevelyn Francois, NP  aspirin EC 81 MG tablet Take 81 mg by mouth daily as needed for moderate pain. Swallow whole.    [provider]  Blood Glucose Monitoring Suppl (PRODIGY AUTOCODE BLOOD GLUCOSE) w/Device KIT 1 kit by Does not apply route in the morning, at noon, in the evening, and at bedtime. 06/06/19 06/05/20  Vevelyn Francois, NP  gabapentin (NEURONTIN) 400 MG capsule Take 1 capsule (400 mg total) by mouth 2 (two) times daily. 01/28/20 01/27/21  Vevelyn Francois, NP  isosorbide mononitrate (IMDUR) 30 MG 24 hr tablet Take 1 tablet (30 mg total) by mouth daily. 01/28/20 01/22/21  Vevelyn Francois, NP  metFORMIN (GLUCOPHAGE) 500 MG tablet Take 1 tablet (500 mg total) by mouth 2 (two) times daily with a meal. 01/28/20 01/27/21  Vevelyn Francois, NP  metoprolol succinate (TOPROL-XL) 50 MG 24 hr tablet Take 1 tablet (50 mg total) by mouth daily. Take with or immediately following a meal. 01/28/20 01/22/21  Vevelyn Francois, NP  rosuvastatin (CRESTOR) 20 MG tablet Take 1 tablet (20 mg total) by mouth daily. 01/28/20 01/22/21  Vevelyn Francois, NP    Allergies    Patient has no known allergies.  Review of Systems   Review of Systems  Constitutional: Negative for chills and fever.  HENT: Positive for nosebleeds. Negative for congestion, ear pain, sinus pain, sneezing and sore throat.   Eyes: Negative for pain and visual disturbance.  Respiratory: Negative for cough and shortness of breath.   Cardiovascular: Negative for chest pain, palpitations and syncope.  Gastrointestinal: Negative for abdominal pain and vomiting.  Genitourinary: Negative for dysuria and hematuria.  Musculoskeletal: Negative for arthralgias and back pain.  Skin: Negative for color change and rash.  Neurological: Negative for dizziness, seizures, syncope and headaches.  All other systems reviewed and are negative.   Physical Exam Updated Vital Signs BP (!) 141/67 (BP  Location: Left Arm)   Pulse 94   Temp 98.6 F (37 C) (Oral)   Resp 15   LMP  (LMP Unknown)   SpO2 92%   Physical Exam Vitals and nursing note reviewed.  Constitutional:      General: She is not in acute distress.    Appearance: She is well-developed.  HENT:  Head: Normocephalic and atraumatic.     Nose: No nasal deformity or laceration.     Right Nostril: No epistaxis.     Left Nostril: Epistaxis present. No foreign body or septal hematoma.     Comments:  Visualized area of oozing blood in the left nare on the medial aspect near the septum.    Mouth/Throat:     Mouth: Mucous membranes are moist. No injury or oral lesions.     Tongue: No lesions.     Pharynx: Oropharynx is clear.  Eyes:     Conjunctiva/sclera: Conjunctivae normal.  Cardiovascular:     Rate and Rhythm: Normal rate and regular rhythm.     Pulses: Normal pulses.     Heart sounds: No murmur heard.   Pulmonary:     Effort: Pulmonary effort is normal. No respiratory distress.     Breath sounds: Normal breath sounds.  Abdominal:     Palpations: Abdomen is soft.     Tenderness: There is no abdominal tenderness.  Musculoskeletal:     Cervical back: Neck supple.  Skin:    General: Skin is warm and dry.  Neurological:     General: No focal deficit present.     Mental Status: She is alert.     GCS: GCS eye subscore is 4. GCS verbal subscore is 5. GCS motor subscore is 6.     Cranial Nerves: Cranial nerves are intact.     ED Results / Procedures / Treatments   Labs (all labs ordered are listed, but only abnormal results are displayed) Labs Reviewed - No data to display  EKG None  Radiology No results found.  Procedures .Epistaxis Management  Date/Time: 06/05/2020 4:11 PM Performed by: Pearson Grippe, DO Authorized by: Davonna Belling, MD   Consent:    Consent obtained:  Verbal   Consent given by:  Patient   Risks, benefits, and alternatives were discussed: yes     Risks discussed:   Infection, nasal injury and pain   Alternatives discussed:  Delayed treatment and alternative treatment Universal protocol:    Patient identity confirmed:  Verbally with patient and arm band Anesthesia:    Anesthesia method:  Topical application   Topical anesthetic:  Lidocaine gel Procedure details:    Treatment site:  L anterior   Treatment method:  Silver nitrate   Treatment complexity:  Limited   Treatment episode: initial   Post-procedure details:    Assessment:  Bleeding stopped   Procedure completion:  Tolerated well, no immediate complications     Medications Ordered in ED Medications  oxymetazoline (AFRIN) 0.05 % nasal spray 1 spray (1 spray Each Nare Given 06/05/20 1446)  lidocaine (XYLOCAINE) 2 % viscous mouth solution 15 mL (15 mLs Mouth/Throat Given 06/05/20 1640)  silver nitrate applicators applicator 1 Stick (1 Stick Topical Given 06/05/20 1659)    ED Course  I have reviewed the triage vital signs and the nursing notes.  Pertinent labs & imaging results that were available during my care of the patient were reviewed by me and considered in my medical decision making (see chart for details).  Clinical Course as of 06/06/20 0138  Thu Jun 05, 2020  1622 Epistaxis has now stopped. Cotton ball still in place. Will allow to sit to tamponade for a few more mins and reassess nare after removal. [ZB]    Clinical Course User Index [ZB] Pearson Grippe, DO   MDM Rules/Calculators/A&P  This is a 55 year old female with a complicated past medical history including poorly controlled diabetes, CKD, right BKA, recurrent UTIs and thrombocytopenia not currently on any anticoagulants but does take a baby aspirin daily who presented to the emergency department with nosebleed from the left nare, atraumatic other than picking her nose. Says that it occurred last night, lasted for about 2 hours and resolved on its own. In the emergency department she was  hemodynamically stable, afebrile. Was able to visualize an anterior location of oozing blood in the left nare.  Initially treated with topical Afrin followed by direct pressure.  However, she did not apply direct pressure for very long. Subsequently placed lidocaine/Afrin soaked cottonball into the nare.  Was able to visualize area that was bleeding. No active hemorrhage on reassessment. Silver nitrate was applied to the area.   No indication for anterior packing at this time. Safe and stable to follow up with her PCP.    Final Clinical Impression(s) / ED Diagnoses Final diagnoses:  Epistaxis  Anterior epistaxis    Rx / DC Orders ED Discharge Orders    None       Pearson Grippe, DO 06/06/20 2197    Davonna Belling, MD 06/06/20 2351

## 2020-06-05 NOTE — Progress Notes (Signed)
Patient Care Team: Patient, No Pcp Per (Inactive) as PCP - General (General Practice)  DIAGNOSIS:    ICD-10-CM   1. Idiopathic thrombocytopenic purpura (ITP) (HCC)  D69.3 CBC with Differential ([place])    CMP ([facility] only)    CHIEF COMPLIANT: Follow-up of recurrent acute ITP  INTERVAL HISTORY: [name] is a 55 y.o. with above-mentioned history of acute ITP who received IVIG treatment along with Nplate. She presents to the clinic today for follow-up.  I wanted her to get labs today and see her back.  Unfortunately after the labs are done she left and went home.  She did not receive Nplate either.  Although I do not think the Nplate is doing much for her.  Therefore we are not trying to bring her back.  She definitely needs IVIG treatments.  She was in the urgent care yesterday with a nosebleed.  ALLERGIES:  has No Known Allergies.  MEDICATIONS:  Current Outpatient Medications  Medication Sig Dispense Refill  . acetaminophen (TYLENOL) 500 MG tablet Take 1,000 mg by mouth as needed (pain).    Marland Kitchen amLODipine (NORVASC) 5 MG tablet Take 1 tablet (5 mg total) by mouth daily. 90 tablet 3  . aspirin EC 81 MG tablet Take 81 mg by mouth daily as needed for moderate pain. Swallow whole.    . Blood Glucose Monitoring Suppl (PRODIGY AUTOCODE BLOOD GLUCOSE) w/Device KIT 1 kit by Does not apply route in the morning, at noon, in the evening, and at bedtime. 1 kit 0  . gabapentin (NEURONTIN) 400 MG capsule Take 1 capsule (400 mg total) by mouth 2 (two) times daily. 180 capsule 3  . isosorbide mononitrate (IMDUR) 30 MG 24 hr tablet Take 1 tablet (30 mg total) by mouth daily. 90 tablet 3  . metFORMIN (GLUCOPHAGE) 500 MG tablet Take 1 tablet (500 mg total) by mouth 2 (two) times daily with a meal. 180 tablet 3  . metoprolol succinate (TOPROL-XL) 50 MG 24 hr tablet Take 1 tablet (50 mg total) by mouth daily. Take with or immediately following a meal. 90 tablet 3  . rosuvastatin  (CRESTOR) 20 MG tablet Take 1 tablet (20 mg total) by mouth daily. 90 tablet 3   No current facility-administered medications for this visit.    PHYSICAL EXAMINATION: ECOG PERFORMANCE STATUS: 1 - Symptomatic but completely ambulatory  Vitals:   06/06/20 1033  BP: 132/76  Pulse: 82  Resp: 17  Temp: 97.6 F (36.4 C)  SpO2: 97%   Filed Weights     LABORATORY DATA:  I have reviewed the data as listed CMP Latest Ref Rng & Units 06/06/2020 05/21/2020 05/08/2020  Glucose 70 - 99 mg/dL 157(H) 130(H) 133(H)  BUN 6 - 20 mg/dL 49(H) 31(H) 34(H)  Creatinine 0.44 - 1.00 mg/dL 1.42(H) 1.44(H) 1.51(H)  Sodium 135 - 145 mmol/L 138 137 132(L)  Potassium 3.5 - 5.1 mmol/L 4.6 5.6(H) 5.6(H)  Chloride 98 - 111 mmol/L 104 103 103  CO2 22 - 32 mmol/L 23 23 20(L)  Calcium 8.9 - 10.3 mg/dL 8.8(L) 8.6(L) 8.6(L)  Total Protein 6.5 - 8.1 g/dL 7.0 8.4(H) 8.4(H)  Total Bilirubin 0.3 - 1.2 mg/dL 0.4 0.4 0.4  Alkaline Phos 38 - 126 U/L 66 82 81  AST 15 - 41 U/L '19 21 22  ' ALT 0 - 44 U/L '8 8 11    ' Lab Results  Component Value Date   WBC 3.9 (L) 06/06/2020   HGB 7.7 (L) 06/06/2020  HCT 25.4 (L) 06/06/2020   MCV 91.7 06/06/2020   PLT 7 (LL) 06/06/2020   NEUTROABS 2.7 06/06/2020    ASSESSMENT & PLAN:  Idiopathic thrombocytopenic purpura (ITP) (HCC) Relapsed Refractory ITP: Relapsing ITP: Patient response to steroids and IVIG but because she is a diabetic we cannot use steroids.Normally responds to IV IG Prior IVIG: March 2021, April 2021, June 2021, Feb 2022 ----------------------------------------------------------------------------------------------------------------------------------------------------------------- Prior treatment:Rituxan weekly x4 started 05/23/2019 (patient missed her treatment on 05/30/2019) (patient missed multiple appointments)  Hospitalization: 06/25/2019-06/29/2019: Nausea and vomiting Hospitalization 08/02/2019-08/09/2019 cellulitis left foot Hospitalization  03/08/2020-03/11/2020: Relapsed ITP and renal failure: Improved with Nplate and IVIG  08/06/7540: Platelets 301, hemoglobin 8.6 09/05/2019: Platelets 110, hemoglobin 11.5 02/28/2020: Platelets 14, hemoglobin 10.1, creatinine 1.63, potassium 6.3 (this is after 2 doses of IVIG) 03/17/2020:Platelets 74, hemoglobin 9 04/09/2020: Platelets less than 5 (increasing the dosage of Nplate ) 3/70/2301: Platelets 10 (Nplate dosing is being managed by pharmacy): IVIG May 2022 05/08/20: Platelets: 141 05/21/2020: Platelet count 14 06/06/2020: Platelets 7  Based on very short-term improvement in the platelet counts with IVIG, I recommended that we treat her with IVIG get the platelets up and then send her for splenectomy.  Plan: IVIG x 2 Surgery consult Patient left before I could provide her with counseling and advise regarding the need for splenectomy. I called her and left a voicemail. She is at risk for bleeding with platelet counts below 10,000.   Orders Placed This Encounter  Procedures  . CBC with Differential (Cancer Center Only)    Standing Status:   Future    Number of Occurrences:   1    Standing Expiration Date:   06/06/2021  . CMP ([facility] only)    Standing Status:   Future    Number of Occurrences:   1    Standing Expiration Date:   06/06/2021   The patient has a good understanding of the overall plan. she agrees with it. she will call with any problems that may develop before the next visit here.  Total time spent: 30 mins including face to face time and time spent for planning, charting and coordination of care  Rulon Eisenmenger, MD, MPH 06/06/2020  I, Cloyde Reams Dorshimer, am acting as scribe for Dr. Nicholas Lose.  I have reviewed the above documentation for accuracy and completeness, and I agree with the above.

## 2020-06-05 NOTE — Discharge Instructions (Signed)
No nose blowing for 3 days. Use Afrin as needed for nasal congestion.  Please schedule an appointment with your primary care doctor to be re-evaluated in 2-3 days.

## 2020-06-05 NOTE — ED Triage Notes (Signed)
Pt from home via EMS for second nosebleed of today. Hasn't taken hypertension medication because her nose has been bleeding. Given afrin by EMS with some improvement.

## 2020-06-06 ENCOUNTER — Inpatient Hospital Stay (HOSPITAL_BASED_OUTPATIENT_CLINIC_OR_DEPARTMENT_OTHER): Payer: Medicare Other | Admitting: Hematology and Oncology

## 2020-06-06 ENCOUNTER — Inpatient Hospital Stay: Payer: Medicare Other

## 2020-06-06 DIAGNOSIS — D693 Immune thrombocytopenic purpura: Secondary | ICD-10-CM

## 2020-06-06 DIAGNOSIS — E119 Type 2 diabetes mellitus without complications: Secondary | ICD-10-CM | POA: Diagnosis not present

## 2020-06-06 DIAGNOSIS — Z9081 Acquired absence of spleen: Secondary | ICD-10-CM | POA: Diagnosis not present

## 2020-06-06 DIAGNOSIS — Z7984 Long term (current) use of oral hypoglycemic drugs: Secondary | ICD-10-CM | POA: Diagnosis not present

## 2020-06-06 DIAGNOSIS — Z79899 Other long term (current) drug therapy: Secondary | ICD-10-CM | POA: Diagnosis not present

## 2020-06-06 LAB — CMP (CANCER CENTER ONLY)
ALT: 8 U/L (ref 0–44)
AST: 19 U/L (ref 15–41)
Albumin: 3.3 g/dL — ABNORMAL LOW (ref 3.5–5.0)
Alkaline Phosphatase: 66 U/L (ref 38–126)
Anion gap: 11 (ref 5–15)
BUN: 49 mg/dL — ABNORMAL HIGH (ref 6–20)
CO2: 23 mmol/L (ref 22–32)
Calcium: 8.8 mg/dL — ABNORMAL LOW (ref 8.9–10.3)
Chloride: 104 mmol/L (ref 98–111)
Creatinine: 1.42 mg/dL — ABNORMAL HIGH (ref 0.44–1.00)
GFR, Estimated: 44 mL/min — ABNORMAL LOW (ref >60)
Glucose, Bld: 157 mg/dL — ABNORMAL HIGH (ref 70–99)
Potassium: 4.6 mmol/L (ref 3.5–5.1)
Sodium: 138 mmol/L (ref 135–145)
Total Bilirubin: 0.4 mg/dL (ref 0.3–1.2)
Total Protein: 7 g/dL (ref 6.5–8.1)

## 2020-06-06 LAB — CBC WITH DIFFERENTIAL (CANCER CENTER ONLY)
Abs Immature Granulocytes: 0 10*3/uL (ref 0.00–0.07)
Basophils Absolute: 0 10*3/uL (ref 0.0–0.1)
Basophils Relative: 0 %
Eosinophils Absolute: 0 10*3/uL (ref 0.0–0.5)
Eosinophils Relative: 1 %
HCT: 25.4 % — ABNORMAL LOW (ref 36.0–46.0)
Hemoglobin: 7.7 g/dL — ABNORMAL LOW (ref 12.0–15.0)
Immature Granulocytes: 0 %
Lymphocytes Relative: 19 %
Lymphs Abs: 0.7 10*3/uL (ref 0.7–4.0)
MCH: 27.8 pg (ref 26.0–34.0)
MCHC: 30.3 g/dL (ref 30.0–36.0)
MCV: 91.7 fL (ref 80.0–100.0)
Monocytes Absolute: 0.4 10*3/uL (ref 0.1–1.0)
Monocytes Relative: 10 %
Neutro Abs: 2.7 10*3/uL (ref 1.7–7.7)
Neutrophils Relative %: 70 %
Platelet Count: 7 10*3/uL — CL (ref 150–400)
RBC: 2.77 MIL/uL — ABNORMAL LOW (ref 3.87–5.11)
RDW: 17.6 % — ABNORMAL HIGH (ref 11.5–15.5)
WBC Count: 3.9 10*3/uL — ABNORMAL LOW (ref 4.0–10.5)
nRBC: 0 % (ref 0.0–0.2)

## 2020-06-06 NOTE — Assessment & Plan Note (Signed)
Relapsed Refractory ITP: Relapsing ITP: Patient response to steroids and IVIG but because she is a diabetic we cannot use steroids.Normally responds to IV IG Prior IVIG: March 2021, April 2021, June 2021, Feb 2022 ----------------------------------------------------------------------------------------------------------------------------------------------------------------- Prior treatment:Rituxan weekly x4 started 05/23/2019 (patient missed her treatment on 05/30/2019) (patient missed multiple appointments)  Hospitalization: 06/25/2019-06/29/2019: Nausea and vomiting Hospitalization 08/02/2019-08/09/2019 cellulitis left foot Hospitalization 03/08/2020-03/11/2020: Relapsed ITP and renal failure: Improved with Nplate and IVIG  08/08/2019: Platelets 301, hemoglobin 8.6 09/05/2019: Platelets 110, hemoglobin 11.5 02/28/2020: Platelets 14, hemoglobin 10.1, creatinine 1.63, potassium 6.3 (this is after 2 doses of IVIG) 03/17/2020:Platelets 74, hemoglobin 9 04/09/2020: Platelets less than 5 (increasing the dosage of Nplate ) 04/17/2020: Platelets 10 (Nplate dosing is being managed by pharmacy): IVIG May 2022 05/08/20: Platelets: 141 05/21/2020: Platelet count 14 06/06/2020:  Based on very short-term improvement in the platelet counts with IVIG, I recommended that we treat her with IVIG get the platelets up and then send her for splenectomy.

## 2020-06-09 ENCOUNTER — Telehealth: Payer: Self-pay | Admitting: *Deleted

## 2020-06-09 ENCOUNTER — Encounter: Payer: Self-pay | Admitting: Hematology and Oncology

## 2020-06-09 ENCOUNTER — Telehealth: Payer: Self-pay | Admitting: Hematology and Oncology

## 2020-06-09 ENCOUNTER — Other Ambulatory Visit: Payer: Medicare Other

## 2020-06-09 NOTE — Telephone Encounter (Signed)
Sch per message 6/6 los, left message

## 2020-06-09 NOTE — Telephone Encounter (Signed)
Received call from pt with complaint of profused nose bleeding x3 days.  Pt states she is unable to come to the cancer center tomorrow for her IVIG infusion and Nplate injection.  Pt states she is going to the [facility] ED to seek treatment for nose bleed and is wanting to receive IVIG and Nplate while in the ED.  RN placed call to [facility] ED to alert charge RN of pt situation.

## 2020-06-10 ENCOUNTER — Telehealth: Payer: Self-pay | Admitting: *Deleted

## 2020-06-10 ENCOUNTER — Inpatient Hospital Stay: Payer: Medicare Other

## 2020-06-10 ENCOUNTER — Ambulatory Visit: Payer: Medicare Other

## 2020-06-10 NOTE — Telephone Encounter (Signed)
RN placed call to pt to see if she will be coming in for infusion apt today.  No answer, LVM to return call to the office.

## 2020-06-11 ENCOUNTER — Inpatient Hospital Stay: Payer: Medicare Other

## 2020-06-11 ENCOUNTER — Telehealth: Payer: Self-pay | Admitting: Hematology and Oncology

## 2020-06-11 NOTE — Telephone Encounter (Signed)
Sch per 6/7 sch msg, left message

## 2020-06-12 ENCOUNTER — Emergency Department (HOSPITAL_COMMUNITY): Payer: Medicare Other

## 2020-06-12 ENCOUNTER — Telehealth: Payer: Self-pay

## 2020-06-12 ENCOUNTER — Inpatient Hospital Stay: Payer: Medicare Other

## 2020-06-12 ENCOUNTER — Inpatient Hospital Stay (HOSPITAL_COMMUNITY)
Admission: EM | Admit: 2020-06-12 | Discharge: 2020-06-15 | DRG: 813 | Disposition: A | Discharge status: Home / Self Care | Payer: Medicare Other | Source: Ambulatory Visit | Attending: Family Medicine | Admitting: Family Medicine

## 2020-06-12 ENCOUNTER — Encounter: Payer: Self-pay | Admitting: *Deleted

## 2020-06-12 ENCOUNTER — Encounter (HOSPITAL_COMMUNITY): Payer: Self-pay

## 2020-06-12 ENCOUNTER — Other Ambulatory Visit: Payer: Self-pay

## 2020-06-12 VITALS — BP 100/43 | HR 63 | Temp 98.6°F | Resp 16

## 2020-06-12 DIAGNOSIS — R0902 Hypoxemia: Secondary | ICD-10-CM | POA: Diagnosis present

## 2020-06-12 DIAGNOSIS — Z7984 Long term (current) use of oral hypoglycemic drugs: Secondary | ICD-10-CM | POA: Diagnosis not present

## 2020-06-12 DIAGNOSIS — E1140 Type 2 diabetes mellitus with diabetic neuropathy, unspecified: Secondary | ICD-10-CM | POA: Diagnosis not present

## 2020-06-12 DIAGNOSIS — Z833 Family history of diabetes mellitus: Secondary | ICD-10-CM | POA: Diagnosis not present

## 2020-06-12 DIAGNOSIS — Z23 Encounter for immunization: Secondary | ICD-10-CM

## 2020-06-12 DIAGNOSIS — I5033 Acute on chronic diastolic (congestive) heart failure: Secondary | ICD-10-CM | POA: Diagnosis present

## 2020-06-12 DIAGNOSIS — E785 Hyperlipidemia, unspecified: Secondary | ICD-10-CM | POA: Diagnosis not present

## 2020-06-12 DIAGNOSIS — N179 Acute kidney failure, unspecified: Secondary | ICD-10-CM | POA: Diagnosis present

## 2020-06-12 DIAGNOSIS — N1832 Chronic kidney disease, stage 3b: Secondary | ICD-10-CM | POA: Diagnosis present

## 2020-06-12 DIAGNOSIS — E875 Hyperkalemia: Secondary | ICD-10-CM | POA: Diagnosis not present

## 2020-06-12 DIAGNOSIS — D693 Immune thrombocytopenic purpura: Principal | ICD-10-CM | POA: Diagnosis present

## 2020-06-12 DIAGNOSIS — D61818 Other pancytopenia: Secondary | ICD-10-CM | POA: Diagnosis present

## 2020-06-12 DIAGNOSIS — E1151 Type 2 diabetes mellitus with diabetic peripheral angiopathy without gangrene: Secondary | ICD-10-CM | POA: Diagnosis not present

## 2020-06-12 DIAGNOSIS — Z8042 Family history of malignant neoplasm of prostate: Secondary | ICD-10-CM

## 2020-06-12 DIAGNOSIS — Z89511 Acquired absence of right leg below knee: Secondary | ICD-10-CM | POA: Diagnosis not present

## 2020-06-12 DIAGNOSIS — Z83438 Family history of other disorder of lipoprotein metabolism and other lipidemia: Secondary | ICD-10-CM

## 2020-06-12 DIAGNOSIS — D649 Anemia, unspecified: Secondary | ICD-10-CM | POA: Diagnosis not present

## 2020-06-12 DIAGNOSIS — E871 Hypo-osmolality and hyponatremia: Secondary | ICD-10-CM | POA: Diagnosis not present

## 2020-06-12 DIAGNOSIS — I10 Essential (primary) hypertension: Secondary | ICD-10-CM | POA: Diagnosis not present

## 2020-06-12 DIAGNOSIS — R040 Epistaxis: Secondary | ICD-10-CM | POA: Diagnosis not present

## 2020-06-12 DIAGNOSIS — I959 Hypotension, unspecified: Secondary | ICD-10-CM | POA: Diagnosis not present

## 2020-06-12 DIAGNOSIS — I890 Lymphedema, not elsewhere classified: Secondary | ICD-10-CM | POA: Diagnosis not present

## 2020-06-12 DIAGNOSIS — Z79899 Other long term (current) drug therapy: Secondary | ICD-10-CM

## 2020-06-12 DIAGNOSIS — D500 Iron deficiency anemia secondary to blood loss (chronic): Secondary | ICD-10-CM | POA: Diagnosis not present

## 2020-06-12 DIAGNOSIS — Z8673 Personal history of transient ischemic attack (TIA), and cerebral infarction without residual deficits: Secondary | ICD-10-CM | POA: Diagnosis not present

## 2020-06-12 DIAGNOSIS — Z20822 Contact with and (suspected) exposure to covid-19: Secondary | ICD-10-CM | POA: Diagnosis present

## 2020-06-12 DIAGNOSIS — Z8249 Family history of ischemic heart disease and other diseases of the circulatory system: Secondary | ICD-10-CM | POA: Diagnosis not present

## 2020-06-12 DIAGNOSIS — E1122 Type 2 diabetes mellitus with diabetic chronic kidney disease: Secondary | ICD-10-CM | POA: Diagnosis present

## 2020-06-12 DIAGNOSIS — Z7982 Long term (current) use of aspirin: Secondary | ICD-10-CM

## 2020-06-12 DIAGNOSIS — I130 Hypertensive heart and chronic kidney disease with heart failure and stage 1 through stage 4 chronic kidney disease, or unspecified chronic kidney disease: Secondary | ICD-10-CM | POA: Diagnosis present

## 2020-06-12 DIAGNOSIS — N183 Chronic kidney disease, stage 3 unspecified: Secondary | ICD-10-CM | POA: Diagnosis not present

## 2020-06-12 DIAGNOSIS — I517 Cardiomegaly: Secondary | ICD-10-CM | POA: Diagnosis not present

## 2020-06-12 DIAGNOSIS — E11319 Type 2 diabetes mellitus with unspecified diabetic retinopathy without macular edema: Secondary | ICD-10-CM | POA: Diagnosis not present

## 2020-06-12 DIAGNOSIS — J9611 Chronic respiratory failure with hypoxia: Secondary | ICD-10-CM | POA: Diagnosis not present

## 2020-06-12 DIAGNOSIS — I081 Rheumatic disorders of both mitral and tricuspid valves: Secondary | ICD-10-CM | POA: Diagnosis not present

## 2020-06-12 DIAGNOSIS — N390 Urinary tract infection, site not specified: Secondary | ICD-10-CM | POA: Diagnosis present

## 2020-06-12 DIAGNOSIS — D631 Anemia in chronic kidney disease: Secondary | ICD-10-CM | POA: Diagnosis not present

## 2020-06-12 DIAGNOSIS — R002 Palpitations: Secondary | ICD-10-CM | POA: Diagnosis not present

## 2020-06-12 LAB — CBC WITH DIFFERENTIAL/PLATELET
Abs Immature Granulocytes: 0.01 10*3/uL (ref 0.00–0.07)
Basophils Absolute: 0 10*3/uL (ref 0.0–0.1)
Basophils Relative: 1 %
Eosinophils Absolute: 0 10*3/uL (ref 0.0–0.5)
Eosinophils Relative: 1 %
HCT: 21.4 % — ABNORMAL LOW (ref 36.0–46.0)
Hemoglobin: 6.3 g/dL — CL (ref 12.0–15.0)
Immature Granulocytes: 0 %
Lymphocytes Relative: 19 %
Lymphs Abs: 0.9 10*3/uL (ref 0.7–4.0)
MCH: 27.9 pg (ref 26.0–34.0)
MCHC: 29.4 g/dL — ABNORMAL LOW (ref 30.0–36.0)
MCV: 94.7 fL (ref 80.0–100.0)
Monocytes Absolute: 0.6 10*3/uL (ref 0.1–1.0)
Monocytes Relative: 13 %
Neutro Abs: 2.9 10*3/uL (ref 1.7–7.7)
Neutrophils Relative %: 66 %
Platelets: <5 10*3/uL — CL (ref 150–400)
RBC: 2.26 MIL/uL — ABNORMAL LOW (ref 3.87–5.11)
RDW: 17.8 % — ABNORMAL HIGH (ref 11.5–15.5)
WBC: 4.4 10*3/uL (ref 4.0–10.5)
nRBC: 0.5 % — ABNORMAL HIGH (ref 0.0–0.2)

## 2020-06-12 LAB — CMP (CANCER CENTER ONLY)
ALT: 9 U/L (ref 0–44)
AST: 15 U/L (ref 15–41)
Albumin: 3.3 g/dL — ABNORMAL LOW (ref 3.5–5.0)
Alkaline Phosphatase: 66 U/L (ref 38–126)
Anion gap: 15 (ref 5–15)
BUN: 41 mg/dL — ABNORMAL HIGH (ref 6–20)
CO2: 18 mmol/L — ABNORMAL LOW (ref 22–32)
Calcium: 8.1 mg/dL — ABNORMAL LOW (ref 8.9–10.3)
Chloride: 100 mmol/L (ref 98–111)
Creatinine: 2.63 mg/dL — ABNORMAL HIGH (ref 0.44–1.00)
GFR, Estimated: 21 mL/min — ABNORMAL LOW (ref >60)
Glucose, Bld: 215 mg/dL — ABNORMAL HIGH (ref 70–99)
Potassium: 6.6 mmol/L (ref 3.5–5.1)
Sodium: 133 mmol/L — ABNORMAL LOW (ref 135–145)
Total Bilirubin: 0.3 mg/dL (ref 0.3–1.2)
Total Protein: 6.7 g/dL (ref 6.5–8.1)

## 2020-06-12 LAB — COMPREHENSIVE METABOLIC PANEL
ALT: 12 U/L (ref 0–44)
AST: 18 U/L (ref 15–41)
Albumin: 3.7 g/dL (ref 3.5–5.0)
Alkaline Phosphatase: 64 U/L (ref 38–126)
Anion gap: 10 (ref 5–15)
BUN: 42 mg/dL — ABNORMAL HIGH (ref 6–20)
CO2: 20 mmol/L — ABNORMAL LOW (ref 22–32)
Calcium: 8.1 mg/dL — ABNORMAL LOW (ref 8.9–10.3)
Chloride: 100 mmol/L (ref 98–111)
Creatinine, Ser: 2.57 mg/dL — ABNORMAL HIGH (ref 0.44–1.00)
GFR, Estimated: 22 mL/min — ABNORMAL LOW (ref >60)
Glucose, Bld: 186 mg/dL — ABNORMAL HIGH (ref 70–99)
Potassium: 6.6 mmol/L (ref 3.5–5.1)
Sodium: 130 mmol/L — ABNORMAL LOW (ref 135–145)
Total Bilirubin: 0.4 mg/dL (ref 0.3–1.2)
Total Protein: 7.2 g/dL (ref 6.5–8.1)

## 2020-06-12 LAB — CBC WITH DIFFERENTIAL (CANCER CENTER ONLY)
Abs Immature Granulocytes: 0.01 10*3/uL (ref 0.00–0.07)
Basophils Absolute: 0 10*3/uL (ref 0.0–0.1)
Basophils Relative: 0 %
Eosinophils Absolute: 0 10*3/uL (ref 0.0–0.5)
Eosinophils Relative: 0 %
HCT: 18.8 % — ABNORMAL LOW (ref 36.0–46.0)
Hemoglobin: 5.8 g/dL — CL (ref 12.0–15.0)
Immature Granulocytes: 0 %
Lymphocytes Relative: 18 %
Lymphs Abs: 0.8 10*3/uL (ref 0.7–4.0)
MCH: 28 pg (ref 26.0–34.0)
MCHC: 30.9 g/dL (ref 30.0–36.0)
MCV: 90.8 fL (ref 80.0–100.0)
Monocytes Absolute: 0.5 10*3/uL (ref 0.1–1.0)
Monocytes Relative: 11 %
Neutro Abs: 3 10*3/uL (ref 1.7–7.7)
Neutrophils Relative %: 71 %
Platelet Count: <5 10*3/uL — CL (ref 150–400)
RBC: 2.07 MIL/uL — ABNORMAL LOW (ref 3.87–5.11)
RDW: 17.2 % — ABNORMAL HIGH (ref 11.5–15.5)
WBC Count: 4.1 10*3/uL (ref 4.0–10.5)
nRBC: 0.5 % — ABNORMAL HIGH (ref 0.0–0.2)

## 2020-06-12 LAB — IRON AND TIBC
Iron: 14 ug/dL — ABNORMAL LOW (ref 28–170)
Saturation Ratios: 4 % — ABNORMAL LOW (ref 10.4–31.8)
TIBC: 324 ug/dL (ref 250–450)
UIBC: 310 ug/dL

## 2020-06-12 LAB — RESP PANEL BY RT-PCR (FLU A&B, COVID) ARPGX2
Influenza A by PCR: NEGATIVE
Influenza B by PCR: NEGATIVE
SARS Coronavirus 2 by RT PCR: NEGATIVE

## 2020-06-12 LAB — RETICULOCYTES
Immature Retic Fract: 24.7 % — ABNORMAL HIGH (ref 2.3–15.9)
RBC.: 2.15 MIL/uL — ABNORMAL LOW (ref 3.87–5.11)
Retic Count, Absolute: 101.5 10*3/uL (ref 19.0–186.0)
Retic Ct Pct: 4.7 % — ABNORMAL HIGH (ref 0.4–3.1)

## 2020-06-12 LAB — BASIC METABOLIC PANEL
Anion gap: 9 (ref 5–15)
BUN: 45 mg/dL — ABNORMAL HIGH (ref 6–20)
CO2: 24 mmol/L (ref 22–32)
Calcium: 8.1 mg/dL — ABNORMAL LOW (ref 8.9–10.3)
Chloride: 99 mmol/L (ref 98–111)
Creatinine, Ser: 2.49 mg/dL — ABNORMAL HIGH (ref 0.44–1.00)
GFR, Estimated: 23 mL/min — ABNORMAL LOW (ref >60)
Glucose, Bld: 121 mg/dL — ABNORMAL HIGH (ref 70–99)
Potassium: 5.4 mmol/L — ABNORMAL HIGH (ref 3.5–5.1)
Sodium: 132 mmol/L — ABNORMAL LOW (ref 135–145)

## 2020-06-12 LAB — CBG MONITORING, ED
Glucose-Capillary: 108 mg/dL — ABNORMAL HIGH (ref 70–99)
Glucose-Capillary: 117 mg/dL — ABNORMAL HIGH (ref 70–99)

## 2020-06-12 LAB — FERRITIN: Ferritin: 43 ng/mL (ref 11–307)

## 2020-06-12 LAB — GLUCOSE, CAPILLARY: Glucose-Capillary: 154 mg/dL — ABNORMAL HIGH (ref 70–99)

## 2020-06-12 LAB — POC OCCULT BLOOD, ED: Fecal Occult Bld: POSITIVE — AB

## 2020-06-12 LAB — FOLATE: Folate: 5.5 ng/mL — ABNORMAL LOW (ref >5.9)

## 2020-06-12 LAB — PREPARE RBC (CROSSMATCH)

## 2020-06-12 LAB — TROPONIN I (HIGH SENSITIVITY): Troponin I (High Sensitivity): 8 ng/L (ref <18)

## 2020-06-12 LAB — VITAMIN B12: Vitamin B-12: 103 pg/mL — ABNORMAL LOW (ref 180–914)

## 2020-06-12 LAB — BRAIN NATRIURETIC PEPTIDE: B Natriuretic Peptide: 774.5 pg/mL — ABNORMAL HIGH (ref 0.0–100.0)

## 2020-06-12 LAB — TSH: TSH: 2.758 u[IU]/mL (ref 0.350–4.500)

## 2020-06-12 MED ORDER — ISOSORBIDE MONONITRATE ER 30 MG PO TB24: 30.0000 mg | ORAL_TABLET | Freq: Every day | ORAL | Status: DC

## 2020-06-12 MED ORDER — FERROUS GLUCONATE 324 (38 FE) MG PO TABS
324.0000 mg | ORAL_TABLET | Freq: Every day | ORAL | Status: DC
  Administered 2020-06-13 – 2020-06-15 (×3): 324 mg via ORAL
  Filled 2020-06-12 (×3): qty 1

## 2020-06-12 MED ORDER — ROSUVASTATIN CALCIUM 20 MG PO TABS
20.0000 mg | ORAL_TABLET | Freq: Every day | ORAL | Status: DC
  Administered 2020-06-12 – 2020-06-14 (×3): 20 mg via ORAL
  Filled 2020-06-12 (×3): qty 1

## 2020-06-12 MED ORDER — INSULIN ASPART 100 UNIT/ML IJ SOLN
0.0000 [IU] | Freq: Three times a day (TID) | INTRAMUSCULAR | Status: DC
  Administered 2020-06-13: 2 [IU] via SUBCUTANEOUS
  Administered 2020-06-13: 1 [IU] via SUBCUTANEOUS
  Administered 2020-06-14: 2 [IU] via SUBCUTANEOUS
  Administered 2020-06-15: 1 [IU] via SUBCUTANEOUS
  Filled 2020-06-12: qty 0.09

## 2020-06-12 MED ORDER — ACETAMINOPHEN 650 MG RE SUPP: 650.0000 mg | Freq: Four times a day (QID) | RECTAL | Status: DC | PRN

## 2020-06-12 MED ORDER — SODIUM BICARBONATE 650 MG PO TABS
1300.0000 mg | ORAL_TABLET | Freq: Two times a day (BID) | ORAL | Status: DC
  Administered 2020-06-12 – 2020-06-15 (×6): 1300 mg via ORAL
  Filled 2020-06-12 (×6): qty 2

## 2020-06-12 MED ORDER — ACETAMINOPHEN 325 MG PO TABS
ORAL_TABLET | ORAL | Status: AC
  Filled 2020-06-12: qty 2

## 2020-06-12 MED ORDER — POLYVINYL ALCOHOL 1.4 % OP SOLN
1.0000 [drp] | OPHTHALMIC | Status: DC | PRN
  Filled 2020-06-12: qty 15

## 2020-06-12 MED ORDER — DIPHENHYDRAMINE HCL 25 MG PO CAPS
ORAL_CAPSULE | ORAL | Status: AC
  Filled 2020-06-12: qty 1

## 2020-06-12 MED ORDER — GABAPENTIN 100 MG PO CAPS
100.0000 mg | ORAL_CAPSULE | Freq: Two times a day (BID) | ORAL | Status: DC
  Administered 2020-06-12 – 2020-06-15 (×6): 100 mg via ORAL
  Filled 2020-06-12 (×6): qty 1

## 2020-06-12 MED ORDER — INSULIN ASPART 100 UNIT/ML IV SOLN
5.0000 [IU] | Freq: Once | INTRAVENOUS | Status: AC
  Administered 2020-06-12: 5 [IU] via INTRAVENOUS
  Filled 2020-06-12: qty 0.05

## 2020-06-12 MED ORDER — ACETAMINOPHEN 325 MG PO TABS
650.0000 mg | ORAL_TABLET | Freq: Four times a day (QID) | ORAL | Status: DC | PRN
  Administered 2020-06-12 – 2020-06-14 (×3): 650 mg via ORAL
  Filled 2020-06-12 (×3): qty 2

## 2020-06-12 MED ORDER — DEXTROSE 5 % IV SOLN
Freq: Once | INTRAVENOUS | Status: DC
  Filled 2020-06-12: qty 250

## 2020-06-12 MED ORDER — DEXTROSE 50 % IV SOLN
1.0000 | Freq: Once | INTRAVENOUS | Status: AC
  Administered 2020-06-12: 50 mL via INTRAVENOUS
  Filled 2020-06-12: qty 50

## 2020-06-12 MED ORDER — ACETAMINOPHEN 325 MG PO TABS: 650.0000 mg | ORAL_TABLET | Freq: Once | ORAL | Status: DC

## 2020-06-12 MED ORDER — FUROSEMIDE 10 MG/ML IJ SOLN
40.0000 mg | Freq: Two times a day (BID) | INTRAMUSCULAR | Status: DC
  Administered 2020-06-12: 40 mg via INTRAVENOUS
  Filled 2020-06-12: qty 4

## 2020-06-12 MED ORDER — AMLODIPINE BESYLATE 5 MG PO TABS: 5.0000 mg | ORAL_TABLET | Freq: Every day | ORAL | Status: DC

## 2020-06-12 MED ORDER — METOPROLOL SUCCINATE ER 50 MG PO TB24: 50.0000 mg | ORAL_TABLET | Freq: Every day | ORAL | Status: DC

## 2020-06-12 MED ORDER — SODIUM CHLORIDE 0.9% IV SOLUTION: Freq: Once | INTRAVENOUS | Status: AC

## 2020-06-12 MED ORDER — IMMUNE GLOBULIN (HUMAN) 10 GM/100ML IV SOLN: 1.0000 g/kg | INTRAVENOUS | Status: DC

## 2020-06-12 MED ORDER — SODIUM CHLORIDE 0.9 % IV SOLN: 10.0000 mL/h | Freq: Once | INTRAVENOUS | Status: DC

## 2020-06-12 MED ORDER — ASPIRIN EC 81 MG PO TBEC: 81.0000 mg | DELAYED_RELEASE_TABLET | Freq: Every day | ORAL | Status: DC | PRN

## 2020-06-12 MED ORDER — SODIUM CHLORIDE 0.9 % IV SOLN
500.0000 mg | Freq: Once | INTRAVENOUS | Status: AC
  Administered 2020-06-13: 500 mg via INTRAVENOUS
  Filled 2020-06-12: qty 25

## 2020-06-12 MED ORDER — SODIUM ZIRCONIUM CYCLOSILICATE 10 G PO PACK
10.0000 g | PACK | Freq: Once | ORAL | Status: AC
  Administered 2020-06-12: 10 g via ORAL
  Filled 2020-06-12: qty 1

## 2020-06-12 MED ORDER — HYDRALAZINE HCL 25 MG PO TABS: 25.0000 mg | ORAL_TABLET | Freq: Four times a day (QID) | ORAL | Status: DC | PRN

## 2020-06-12 MED ORDER — DIPHENHYDRAMINE HCL 25 MG PO TABS
25.0000 mg | ORAL_TABLET | Freq: Once | ORAL | Status: DC
  Filled 2020-06-12: qty 1

## 2020-06-12 MED ORDER — ROMIPLOSTIM 125 MCG ~~LOC~~ SOLR: 1.0000 ug/kg | Freq: Once | SUBCUTANEOUS | Status: DC

## 2020-06-12 NOTE — Consult Note (Signed)
Reason for Consult: Renal failure Referring Physician:  Dr. Wynetta Fines  Chief Complaint: Abonormal labs   Assessment/Plan: Acute renal failure on CKD3b with a baseline cr of 1.4-1.5 and has not seen a nephrologist since 2017 by Dr. Hollie Salk. H/o neurogenic bladder with bilateral hydronephrosis which resolved leading to removal of the foley. She was followed by urology at that time and also Dr. Hollie Salk but has not been seen since 10/29/2015. - Will send urine studies with lytes, UPC; will send serologies as well. - Renal ultrasound to rule out obstruction. - Renal dose medications for a GFR <20 ml/min. - Strict I&O's and avoid nephrotoxic agents such as NSAID's and contrast if possible. - IVIG can cause a rare  osmotic damage to the tubules and interstitium; this usually resolves in 1-2 weeks. - No absolute indications for RRT and will follow closely. - Fluid restrict to 1.2 L / day. Hyperkalemia - treated with D50 and insulin; repeat blood work and if high use Lokelma 10gm. Thrombocytopenia with a h/o ITP currently being transfused with platelets prior to blood transfusion per heme recs. Anemia - she will be transfused later today; certainly symptomatic. Hypertension - hold antihypertensives for now as BP is on the lower side. PAD s/p Rt BKA HFpEF   HPI: [name] is an 55 y.o. female HTN HLD DM neuropathy, PAD,  HFpEF, CKD3 with a history of ITP and receives IVIG at the cancer center but was unable to receive it today due to critical lab values. She states that she has had intermittent nose bleeds this past weekend as well as worsening fatigue and appetite with associated palpitations. She denies melena, hematochezia or hematemesis; she also denies obstructive symptoms, hematuria, dysuria or nephrolithiasis. She has not been eating or drinking as much over the past week but denies nausea and vomiting. In the dto be bradycardic with lower extremity edema, platelet count of 5 and severely  anemic as well with hyponatremia and hyperkalemia. Her renal function was also noted to have worsened from baseline of 1.4 to 2.63. No peaked t-waves were noted but pt occult blood stool specimen was positive.  Patient also received D50 and insulin for the hyperkalemia. Pt is on Lasix at home but denies any NSAID use, fever, chills, orthopnea, rashes, joint pain but has been diagnosied previously with bilateral hydronephrosis thought to be secondary to a neurogenic bladder which subsequently resolved and the foley was removed.  ROS Pertinent items are noted in HPI.  Chemistry and CBC: Creatinine  Date/Time Value Ref Range Status  06/12/2020 07:55 AM 2.63 (H) 0.44 - 1.00 mg/dL Final  06/06/2020 10:52 AM 1.42 (H) 0.44 - 1.00 mg/dL Final  05/21/2020 10:09 AM 1.44 (H) 0.44 - 1.00 mg/dL Final  05/08/2020 08:26 AM 1.51 (H) 0.44 - 1.00 mg/dL Final  05/01/2020 03:15 PM 1.39 (H) 0.44 - 1.00 mg/dL Final  04/22/2020 11:52 AM 1.70 (H) 0.44 - 1.00 mg/dL Final  04/17/2020 10:45 AM 1.52 (H) 0.44 - 1.00 mg/dL Final  04/09/2020 03:19 PM 1.31 (H) 0.44 - 1.00 mg/dL Final  03/26/2020 02:41 PM 1.33 (H) 0.44 - 1.00 mg/dL Final  03/18/2020 09:24 AM 0.93 0.44 - 1.00 mg/dL Final  09/19/2019 02:49 PM 1.22 (H) 0.44 - 1.00 mg/dL Final  09/05/2019 02:01 PM 1.05 (H) 0.44 - 1.00 mg/dL Final  07/25/2019 10:45 AM 1.74 (H) 0.44 - 1.00 mg/dL Final  07/18/2019 01:00 PM 1.29 (H) 0.44 - 1.00 mg/dL Final  06/06/2019 07:57 AM 1.45 (H) 0.44 - 1.00 mg/dL Final  05/23/2019 11:36 AM 1.21 (H) 0.44 - 1.00 mg/dL Final  04/03/2019 02:19 PM 1.69 (H) 0.44 - 1.00 mg/dL Final  02/05/2019 01:44 PM 1.74 (H) 0.44 - 1.00 mg/dL Final  01/22/2019 01:02 PM 1.26 (H) 0.44 - 1.00 mg/dL Final  09/30/2015 11:18 AM 1.3 (H) 0.6 - 1.1 mg/dL Final   Creatinine, Ser  Date/Time Value Ref Range Status  06/12/2020 10:09 AM 2.57 (H) 0.44 - 1.00 mg/dL Final  03/11/2020 03:48 AM 1.23 (H) 0.44 - 1.00 mg/dL Final  03/10/2020 04:42 AM 1.33 (H) 0.44 - 1.00  mg/dL Final  03/09/2020 05:05 AM 1.44 (H) 0.44 - 1.00 mg/dL Final  03/08/2020 11:09 AM 1.92 (H) 0.44 - 1.00 mg/dL Final  08/08/2019 02:56 AM 0.88 0.44 - 1.00 mg/dL Final  08/07/2019 03:58 AM 1.05 (H) 0.44 - 1.00 mg/dL Final  08/06/2019 05:04 AM 1.12 (H) 0.44 - 1.00 mg/dL Final  08/05/2019 05:07 AM 1.53 (H) 0.44 - 1.00 mg/dL Final  08/04/2019 05:00 AM 2.00 (H) 0.44 - 1.00 mg/dL Final  08/03/2019 04:34 AM 2.20 (H) 0.44 - 1.00 mg/dL Final  08/02/2019 11:31 AM 2.36 (H) 0.44 - 1.00 mg/dL Final  07/31/2019 08:38 PM 1.86 (H) 0.44 - 1.00 mg/dL Final  07/30/2019 05:16 PM 1.82 (H) 0.44 - 1.00 mg/dL Final  06/29/2019 06:12 AM 1.31 (H) 0.44 - 1.00 mg/dL Final  06/28/2019 09:41 AM 1.41 (H) 0.44 - 1.00 mg/dL Final  06/27/2019 04:40 AM 2.09 (H) 0.44 - 1.00 mg/dL Final  06/26/2019 01:24 AM 2.14 (H) 0.44 - 1.00 mg/dL Final  05/16/2019 03:22 PM 1.21 (H) 0.57 - 1.00 mg/dL Final  04/30/2019 10:17 AM 1.62 (H) 0.57 - 1.00 mg/dL Final  04/19/2019 05:45 AM 1.52 (H) 0.44 - 1.00 mg/dL Final  04/18/2019 04:45 AM 1.38 (H) 0.44 - 1.00 mg/dL Final  04/17/2019 05:00 AM 1.50 (H) 0.44 - 1.00 mg/dL Final  04/16/2019 05:42 AM 1.70 (H) 0.44 - 1.00 mg/dL Final  04/15/2019 04:18 AM 1.45 (H) 0.44 - 1.00 mg/dL Final  04/14/2019 04:26 AM 1.41 (H) 0.44 - 1.00 mg/dL Final  04/13/2019 04:37 AM 1.45 (H) 0.44 - 1.00 mg/dL Final  04/12/2019 05:17 AM 1.39 (H) 0.44 - 1.00 mg/dL Final  04/10/2019 04:10 PM 1.33 (H) 0.44 - 1.00 mg/dL Final  03/20/2019 11:29 AM 1.81 (H) 0.44 - 1.00 mg/dL Final  02/13/2019 06:57 PM 1.50 (H) 0.44 - 1.00 mg/dL Final  12/15/2018 02:05 PM 1.21 (H) 0.57 - 1.00 mg/dL Final  11/13/2018 09:18 AM 1.30 (H) 0.44 - 1.00 mg/dL Final  11/12/2018 11:00 AM 1.59 (H) 0.44 - 1.00 mg/dL Final  11/11/2018 03:50 AM 1.76 (H) 0.44 - 1.00 mg/dL Final  11/10/2018 04:00 AM 1.38 (H) 0.44 - 1.00 mg/dL Final  11/09/2018 04:50 AM 1.61 (H) 0.44 - 1.00 mg/dL Final  11/08/2018 03:59 AM 1.64 (H) 0.44 - 1.00 mg/dL Final  11/07/2018  04:12 AM 1.63 (H) 0.44 - 1.00 mg/dL Final  11/06/2018 06:33 AM 1.83 (H) 0.44 - 1.00 mg/dL Final  11/05/2018 06:24 AM 1.71 (H) 0.44 - 1.00 mg/dL Final  11/04/2018 08:38 PM 1.53 (H) 0.44 - 1.00 mg/dL Final  01/30/2018 08:31 AM 1.42 (H) 0.44 - 1.00 mg/dL Final  01/29/2018 05:25 AM 1.71 (H) 0.44 - 1.00 mg/dL Final  01/28/2018 08:39 AM 1.82 (H) 0.44 - 1.00 mg/dL Final  01/28/2018 03:49 AM 2.01 (H) 0.44 - 1.00 mg/dL Final  01/26/2018 03:30 AM 1.37 (H) 0.44 - 1.00 mg/dL Final  01/25/2018 05:40 PM 1.37 (H) 0.44 - 1.00 mg/dL Final  03/31/2017 02:50 PM 1.25 (H) 0.57 -  1.00 mg/dL Final  03/08/2017 05:13 AM 1.25 (H) 0.44 - 1.00 mg/dL Final  03/07/2017 02:43 PM 1.60 (H) 0.44 - 1.00 mg/dL Final  03/04/2017 06:03 AM 1.43 (H) 0.44 - 1.00 mg/dL Final   Recent Labs  Lab 06/06/20 1052 06/12/20 0755 06/12/20 1009  NA 138 133* 130*  K 4.6 6.6* 6.6*  CL 104 100 100  CO2 23 18* 20*  GLUCOSE 157* 215* 186*  BUN 49* 41* 42*  CREATININE 1.42* 2.63* 2.57*  CALCIUM 8.8* 8.1* 8.1*   Recent Labs  Lab 06/06/20 1052 06/12/20 0755 06/12/20 1009  WBC 3.9* 4.1 4.4  NEUTROABS 2.7 3.0 2.9  HGB 7.7* 5.8* 6.3*  HCT 25.4* 18.8* 21.4*  MCV 91.7 90.8 94.7  PLT 7* <5* <5*   Liver Function Tests: Recent Labs  Lab 06/06/20 1052 06/12/20 0755 06/12/20 1009  AST '19 15 18  ' ALT '8 9 12  ' ALKPHOS 66 66 64  BILITOT 0.4 0.3 0.4  PROT 7.0 6.7 7.2  ALBUMIN 3.3* 3.3* 3.7   No results for input(s): LIPASE, AMYLASE in the last 168 hours. No results for input(s): AMMONIA in the last 168 hours. Cardiac Enzymes: No results for input(s): CKTOTAL, CKMB, CKMBINDEX, TROPONINI in the last 168 hours. Iron Studies:  Recent Labs    06/12/20 1350  IRON 14*  TIBC 324  FERRITIN 43   PT/INR: '@LABRCNTIP' (inr:5)  Xrays/Other Studies: ) Results for orders placed or performed during the hospital encounter of 06/12/20 (from the past 48 hour(s))  Type and screen [place]     Status: None   Collection  Time: 06/12/20  9:47 AM  Result Value Ref Range   ABO/RH(D) B POS    Antibody Screen NEG    Sample Expiration      06/15/2020,2359 Performed at [facility], [place] [address], [place], [place] [address]   Brain natriuretic peptide     Status: Abnormal   Collection Time: 06/12/20  9:54 AM  Result Value Ref Range   B Natriuretic Peptide 774.5 (H) 0.0 - 100.0 pg/mL    Comment: Performed at [facility], [place] [address], [place], [place] [address]  Troponin I (High Sensitivity)     Status: None   Collection Time: 06/12/20  9:54 AM  Result Value Ref Range   Troponin I (High Sensitivity) 8 <18 ng/L    Comment: (NOTE) Elevated high sensitivity troponin I (hsTnI) values and significant  changes across serial measurements may suggest ACS but many other  chronic and acute conditions are known to elevate hsTnI results.  Refer to the "Links" section for chest pain algorithms and additional  guidance. Performed at [facility], [place] [address], [place], [place] [address]   CBC with Differential     Status: Abnormal   Collection Time: 06/12/20 10:09 AM  Result Value Ref Range   WBC 4.4 4.0 - 10.5 K/uL   RBC 2.26 (L) 3.87 - 5.11 MIL/uL   Hemoglobin 6.3 (LL) 12.0 - 15.0 g/dL    Comment: This critical result has verified and been called to [place]. by [facility] on 06 09 2022 at 1036, and has been read back.    HCT 21.4 (L) 36.0 - 46.0 %   MCV 94.7 80.0 - 100.0 fL   MCH 27.9 26.0 - 34.0 pg   MCHC 29.4 (L) 30.0 - 36.0 g/dL   RDW 17.8 (H) 11.5 - 15.5 %   Platelets <5 (LL) 150 - 400 K/uL  Comment: This critical result has verified and been called to [facility]. by [facility] on 06 09 2022 at 1036, and has been read back.    nRBC 0.5 (H) 0.0 - 0.2 %   Neutrophils Relative % 66 %   Neutro Abs 2.9 1.7 - 7.7 K/uL   Lymphocytes Relative 19 %   Lymphs Abs 0.9 0.7 - 4.0 K/uL   Monocytes Relative 13 %   Monocytes Absolute 0.6 0.1 -  1.0 K/uL   Eosinophils Relative 1 %   Eosinophils Absolute 0.0 0.0 - 0.5 K/uL   Basophils Relative 1 %   Basophils Absolute 0.0 0.0 - 0.1 K/uL   Immature Granulocytes 0 %   Abs Immature Granulocytes 0.01 0.00 - 0.07 K/uL    Comment: Performed at [facility], [place] [address] [place], [place] [address]  Comprehensive metabolic panel     Status: Abnormal   Collection Time: 06/12/20 10:09 AM  Result Value Ref Range   Sodium 130 (L) 135 - 145 mmol/L   Potassium 6.6 (HH) 3.5 - 5.1 mmol/L    Comment: NO VISIBLE HEMOLYSIS REPEATED TO VERIFY CRITICAL RESULT CALLED TO, READ BACK BY AND VERIFIED WITH: JACKMAN,C. RN AT 1107 06/12/20 MULLINS,T    Chloride 100 98 - 111 mmol/L   CO2 20 (L) 22 - 32 mmol/L   Glucose, Bld 186 (H) 70 - 99 mg/dL    Comment: Glucose reference range applies only to samples taken after fasting for at least 8 hours.   BUN 42 (H) 6 - 20 mg/dL   Creatinine, Ser 2.57 (H) 0.44 - 1.00 mg/dL   Calcium 8.1 (L) 8.9 - 10.3 mg/dL   Total Protein 7.2 6.5 - 8.1 g/dL   Albumin 3.7 3.5 - 5.0 g/dL   AST 18 15 - 41 U/L   ALT 12 0 - 44 U/L   Alkaline Phosphatase 64 38 - 126 U/L   Total Bilirubin 0.4 0.3 - 1.2 mg/dL   GFR, Estimated 22 (L) >60 mL/min    Comment: (NOTE) Calculated using the CKD-EPI Creatinine Equation (2021)    Anion gap 10 5 - 15    Comment: Performed at [facility], [place] [address], [place], [place] [address]  Resp Panel by RT-PCR (Flu A&B, Covid) Nasopharyngeal Swab     Status: None   Collection Time: 06/12/20 10:27 AM   Specimen: Nasopharyngeal Swab; Nasopharyngeal(NP) swabs in vial transport medium  Result Value Ref Range   SARS Coronavirus 2 by RT PCR NEGATIVE NEGATIVE    Comment: (NOTE) SARS-CoV-2 target nucleic acids are NOT DETECTED.  The SARS-CoV-2 RNA is generally detectable in upper respiratory specimens during the acute phase of infection. The lowest concentration of SARS-CoV-2 viral copies this assay can  detect is 138 copies/mL. A negative result does not preclude SARS-Cov-2 infection and should not be used as the sole basis for treatment or other patient management decisions. A negative result may occur with  improper specimen collection/handling, submission of specimen other than nasopharyngeal swab, presence of viral mutation(s) within the areas targeted by this assay, and inadequate number of viral copies(<138 copies/mL). A negative result must be combined with clinical observations, patient history, and epidemiological information. The expected result is Negative.  Fact Sheet for Patients:  EntrepreneurPulse.com.au  Fact Sheet for Healthcare Providers:  IncredibleEmployment.be  This test is no t yet approved or cleared by the [place] FDA and  has been authorized for detection and/or diagnosis of SARS-CoV-2 by FDA under an  Emergency Use Authorization (EUA). This EUA will remain  in effect (meaning this test can be used) for the duration of the COVID-19 declaration under Section 564(b)(1) of the Act, 21 U.S.C.section 360bbb-3(b)(1), unless the authorization is terminated  or revoked sooner.       Influenza A by PCR NEGATIVE NEGATIVE   Influenza B by PCR NEGATIVE NEGATIVE    Comment: (NOTE) The Xpert Xpress SARS-CoV-2/FLU/RSV plus assay is intended as an aid in the diagnosis of influenza from Nasopharyngeal swab specimens and should not be used as a sole basis for treatment. Nasal washings and aspirates are unacceptable for Xpert Xpress SARS-CoV-2/FLU/RSV testing.  Fact Sheet for Patients: EntrepreneurPulse.com.au  Fact Sheet for Healthcare Providers: IncredibleEmployment.be  This test is not yet approved or cleared by the [place] FDA and has been authorized for detection and/or diagnosis of SARS-CoV-2 by FDA under an Emergency Use Authorization (EUA). This EUA will remain in effect  (meaning this test can be used) for the duration of the COVID-19 declaration under Section 564(b)(1) of the Act, 21 U.S.C. section 360bbb-3(b)(1), unless the authorization is terminated or revoked.  Performed at [facility], [place] [address] [place], [place] [address]   Prepare platelet pheresis     Status: None (Preliminary result)   Collection Time: 06/12/20 11:57 AM  Result Value Ref Range   Unit Number [id number]    Blood Component Type PSORALEN TREATED    Unit division 00    Status of Unit ISSUED    Transfusion Status      OK TO TRANSFUSE Performed at [place] [address] [place], [place] [address]   POC CBG, ED     Status: Abnormal   Collection Time: 06/12/20 12:34 PM  Result Value Ref Range   Glucose-Capillary 108 (H) 70 - 99 mg/dL    Comment: Glucose reference range applies only to samples taken after fasting for at least 8 hours.  Vitamin B12     Status: Abnormal   Collection Time: 06/12/20  1:50 PM  Result Value Ref Range   Vitamin B-12 103 (L) 180 - 914 pg/mL    Comment: (NOTE) This assay is not validated for testing neonatal or myeloproliferative syndrome specimens for Vitamin B12 levels. Performed at [facility], [place] [address] [place], [place] [address]   Folate     Status: Abnormal   Collection Time: 06/12/20  1:50 PM  Result Value Ref Range   Folate 5.5 (L) >5.9 ng/mL    Comment: Performed at [facility], [place] [address], [place], [place] [address]  Iron and TIBC     Status: Abnormal   Collection Time: 06/12/20  1:50 PM  Result Value Ref Range   Iron 14 (L) 28 - 170 ug/dL   TIBC 324 250 - 450 ug/dL   Saturation Ratios 4 (L) 10.4 - 31.8 %   UIBC 310 ug/dL    Comment: Performed at [facility], [place] [address] [place], [place] [address]  Ferritin     Status: None   Collection Time: 06/12/20  1:50 PM  Result Value Ref Range   Ferritin 43 11 - 307  ng/mL    Comment: Performed at [facility], [place] [address] [place],  [address]  Reticulocytes     Status: Abnormal   Collection Time: 06/12/20  1:50 PM  Result Value Ref Range   Retic Ct Pct 4.7 (H) 0.4 - 3.1 %   RBC. 2.15 (  L) 3.87 - 5.11 MIL/uL   Retic Count, Absolute 101.5 19.0 - 186.0 K/uL   Immature Retic Fract 24.7 (H) 2.3 - 15.9 %    Comment: Performed at [facility], [place] [address], [place], [place] [address]  POC occult blood, ED Provider will collect     Status: Abnormal   Collection Time: 06/12/20  3:19 PM  Result Value Ref Range   Fecal Occult Bld POSITIVE (A) NEGATIVE   DG Chest Portable 1 View  Result Date: 06/12/2020 CLINICAL DATA:  palpitations EXAM: PORTABLE CHEST 1 VIEW COMPARISON:  Chest radiograph 03/09/2020 FINDINGS: Unchanged, enlarged cardiac silhouette. There is no focal airspace consolidation. There is no large pleural effusion or visible pneumothorax. No acute osseous abnormality. IMPRESSION: Unchanged cardiomegaly.  No focal airspace disease. Electronically Signed   By: Maurine Simmering   On: 06/12/2020 10:33    PMH:   Past Medical History:  Diagnosis Date   Acute exacerbation of CHF (congestive heart failure) ([place]) 04/11/2019   Anemia    Arthritis    "spine, hands" (11/25/2015)   Back pain    "all my back; probably 3 times/week" (11/25/2015)   Chronic indwelling Foley catheter 03/04/2017   Diabetic foot ulcer associated with type 2 diabetes mellitus ([place])    Hypertension    Intracerebral hemorrhage ([place]) As a teenager    States she had burst blood vessel as teenager, now with resultant minor visual field and hearing deficits   Neuropathy    Stroke [facility]) ~ 1982   "my feeling on my RLE, right lower eye vision,  & hearing out of my right ear not the same since" (11/25/2015)   Type II diabetes mellitus ([facility])     PSH:   Past Surgical History:  Procedure Laterality Date   AMPUTATION  11/24/2010   Procedure:  AMPUTATION FOOT;  Surgeon: Wylene Simmer, MD;  Location: [place];  Service: Orthopedics;  Laterality: Right;  Right  FOOT TRANS METATARSAL AMPUTATION   AMPUTATION  07/02/2011   Procedure: AMPUTATION BELOW KNEE;  Surgeon: Wylene Simmer, MD;  Location: [place];  Service: Orthopedics;  Laterality: Right;   APPLICATION OF WOUND VAC  07/02/2011   Procedure: APPLICATION OF WOUND VAC;  Surgeon: Wylene Simmer, MD;  Location: [place];  Service: Orthopedics;  Laterality: Right;   BRAIN SURGERY  1980's   Previous brain surgery in [place], [place]  2004; 2006   COLONOSCOPY N/A 09/17/2015   Procedure: COLONOSCOPY;  Surgeon: Wilford Corner, MD;  Location: [facility] ENDOSCOPY;  Service: Endoscopy;  Laterality: N/A;   ESOPHAGOGASTRODUODENOSCOPY N/A 09/17/2015   Procedure: ESOPHAGOGASTRODUODENOSCOPY (EGD);  Surgeon: Wilford Corner, MD;  Location: [facility] ENDOSCOPY;  Service: Endoscopy;  Laterality: N/A;   I & D EXTREMITY  11/24/2010   Procedure: IRRIGATION AND DEBRIDEMENT EXTREMITY;  Surgeon: Wylene Simmer, MD;  Location: [place];  Service: Orthopedics;  Laterality: Left;  Debriedment of left plantar ulcer and trimming of toenails   I & D EXTREMITY  07/02/2011   Procedure: IRRIGATION AND DEBRIDEMENT EXTREMITY;  Surgeon: Wylene Simmer, MD;  Location: [place];  Service: Orthopedics;  Laterality: Left;  I & D of Left Foot   I & D EXTREMITY  07/06/2011   Procedure: IRRIGATION AND DEBRIDEMENT EXTREMITY;  Surgeon: Wylene Simmer, MD;  Location: [place];  Service: Orthopedics;  Laterality: Right;  IRRIGATION/DEBRIDEMENT RIGHT BELOW KNEE AMPUTATION   TUBAL LIGATION  2006    Allergies: No Known Allergies  Medications:   Prior to Admission medications  Medication Sig Start Date End Date Taking? Authorizing Provider  amLODipine (NORVASC) 5 MG tablet Take 1 tablet (5 mg total) by mouth daily. 01/28/20 01/27/21 Yes Vevelyn Francois, NP  aspirin EC 81 MG tablet Take 81 mg by mouth daily as needed for moderate pain. Swallow whole.   Yes [provider]  furosemide (LASIX) 40 MG tablet Take 160 mg by mouth daily. 04/02/20  Yes [provider]  gabapentin (NEURONTIN) 400 MG capsule Take 1 capsule (400 mg total) by mouth 2 (two) times daily. 01/28/20 01/27/21 Yes Vevelyn Francois, NP  isosorbide mononitrate (IMDUR) 30 MG 24 hr tablet Take 1 tablet (30 mg total) by mouth daily. 01/28/20 01/22/21 Yes Vevelyn Francois, NP  metFORMIN (GLUCOPHAGE) 500 MG tablet Take 1 tablet (500 mg total) by mouth 2 (two) times daily with a meal. Patient taking differently: Take 500 mg by mouth every other day. 01/28/20 01/27/21 Yes Vevelyn Francois, NP  metoprolol succinate (TOPROL-XL) 50 MG 24 hr tablet Take 1 tablet (50 mg total) by mouth daily. Take with or immediately following a meal. 01/28/20 01/22/21 Yes King, Diona Foley, NP  polyvinyl alcohol (LIQUIFILM TEARS) 1.4 % ophthalmic solution Place 1 drop into both eyes as needed for dry eyes.   Yes [provider]  rosuvastatin (CRESTOR) 20 MG tablet Take 1 tablet (20 mg total) by mouth daily. 01/28/20 01/22/21 Yes Vevelyn Francois, NP  Blood Glucose Monitoring Suppl (PRODIGY AUTOCODE BLOOD GLUCOSE) w/Device KIT 1 kit by Does not apply route in the morning, at noon, in the evening, and at bedtime. 06/06/19 06/05/20  Vevelyn Francois, NP    Discontinued Meds:   Medications Discontinued During This Encounter  Medication Reason   acetaminophen (TYLENOL) 500 MG tablet     Social History:  reports that she has never smoked. She has never used smokeless tobacco. She reports current alcohol use of about 2.0 standard drinks of alcohol per week. She reports that she does not use drugs.  Family History:   Family History  Problem Relation Age of Onset   Diabetes Mother    Hypertension Mother    Hyperlipidemia Mother    Diabetes Father    Cancer Father        prostate   Hyperlipidemia Father    Hypertension Father    Diabetes Brother    Peripheral Artery Disease Brother        has had toes amputated    Diabetes Son     Blood pressure (!) 116/49, pulse 65, temperature (!) 97.5 F (36.4 C), temperature source Oral, resp. rate 18, height '5\' 7"'  (1.702 m), weight 90.7 kg, SpO2 92 %. General appearance: alert, cooperative, and appears stated age Head: Normocephalic, without obvious abnormality, atraumatic Eyes: conj pallor Neck: no adenopathy, no carotid bruit, supple, symmetrical, trachea midline, and thyroid not enlarged, symmetric, no tenderness/mass/nodules Back: symmetric, no curvature. ROM normal. No CVA tenderness. Resp: clear to auscultation bilaterally Chest wall: no tenderness Cardio: regular rate and rhythm GI: obese Extremities: edema 2+, right BKA Pulses: 2+ and symmetric Skin: Petechiae       Dwana Melena, MD 06/12/2020, 3:24 PM

## 2020-06-12 NOTE — ED Notes (Signed)
Verbal report given to RN Windell Moulding who is ready for pt.

## 2020-06-12 NOTE — ED Triage Notes (Signed)
Pt was at cancer center to get her routine platelet infusion - their blood work showed high potassium and low hemoglobin. Pt reports she had a nosebleed x 3 days this weekend.

## 2020-06-12 NOTE — Progress Notes (Signed)
CRITICAL VALUE STICKER  CRITICAL VALUE: K 6.6, Hgb 5.8, Plt <5  Donia Ast, RN  DATE & TIME NOTIFIED: 06/12/20 at 0830  MD NOTIFIED: Serena Croissant, MD  TIME OF NOTIFICATION: 0930  RESPONSE: Per MD pt needing to be admitted to the ED for further evaluation.  RN placed call to charge RN Misty Stanley who states pt can be placed in room 16.

## 2020-06-12 NOTE — ED Notes (Signed)
[facility] 6.3, platlet count under 5, PA Caroline called results.

## 2020-06-12 NOTE — H&P (Addendum)
History and Physical    [name] ZDG:[id number] DOB: [birth year] DOA: 06/12/2020  PCP: Patient, No Pcp Per (Inactive) (Confirm with patient/family/NH records and if not entered, this has to be entered at [facility] point of entry) Patient coming from: Home  I have personally briefly reviewed patient's old medical records in [place]  Chief Complaint: Feeling tired, nosebleed  HPI: [name] is a 55 y.o. female with medical history significant of ITP on IVIG, HTN, chronic diastolic CHF, CKD stage II, IIDM, sent from oncologist office for evaluation of worsening of pancytopenia and worsening of kidney function.  Patient has history of ITP, used to be on the and Nplate treatment however recently patient has a surge of her platelet count when her platelet dropped to under 5 in March and April.  Patient reported that she has had severe nosebleed since Thursday last week, she came to ED last Friday was treated and nosebleed was stopped and patient sent back home.  Saturday patient had nosebleeding whole day but feeling too weak to come to ED.  She denied any black tarry stool no abdominal pain.  Today, patient went to see her hematology and blood work showed worsening of thrombocytopenia and AKI with anemia and patient sent to ED.  Patient also complaining about continued to have frequent episodes of nosebleed Monday and Tuesday but no more bleed yesterday.   ED Course: Blood pressure on the lower side, hemoglobin 5.8, creatinine 2.5 compared to baseline 1.12-monthago, platelet less than 5.  Review of Systems: As per HPI otherwise 14 point review of systems negative.    Past Medical History:  Diagnosis Date   Acute exacerbation of CHF (congestive heart failure) ([place] 04/11/2019   Anemia    Arthritis    "spine, hands" (11/25/2015)   Back pain    "all my back; probably 3 times/week" (11/25/2015)   Chronic indwelling Foley catheter 03/04/2017   Diabetic foot ulcer associated with  type 2 diabetes mellitus ([place]    Hypertension    [place]tracerebral [place]    States she had burst blood vessel as teenager, now with resultant minor visual field and hearing deficits   Neuropathy    Stroke ([facility] ~ 1982   "my feeling on my RLE, right lower eye vision,  & hearing out of my right ear not the same since" (11/25/2015)   Type II diabetes mellitus ([place]     Past Surgical History:  Procedure Laterality Date   AMPUTATION  11/24/2010   Procedure: AMPUTATION FOOT;  Surgeon: JWylene Simmer MD;  [place] [facility] [facility]  Service: Orthopedics;  Laterality: Right;  Right  FOOT TRANS METATARSAL AMPUTATION   AMPUTATION  07/02/2011   Procedure: AMPUTATION BELOW KNEE;  Surgeon: JWylene Simmer MD;  [place]  Service: Orthopedics;  Laterality: Right;   APPLICATION OF WOUND VAC  07/02/2011   Procedure: APPLICATION OF WOUND VAC;  Surgeon: JWylene Simmer MD;  [place]  Service: Orthopedics;  Laterality: Right;   BRAIN SURGERY  1980's   [place]   COLONOSCOPY N/A 09/17/2015   Procedure: COLONOSCOPY;  Surgeon: VWilford Corner MD;  [facility]  Service: Endoscopy;  Laterality: N/A;   ESOPHAGOGASTRODUODENOSCOPY N/A 09/17/2015   Procedure: ESOPHAGOGASTRODUODENOSCOPY (EGD);  Surgeon: VWilford Corner MD;  [facility];  Service: Endoscopy;  Laterality: N/A;   I & D EXTREMITY  11/24/2010   Procedure: IRRIGATION AND DEBRIDEMENT EXTREMITY;  Surgeon: Wylene Simmer, MD;  Location: [place];  Service: Orthopedics;  Laterality: Left;  Debriedment of left plantar ulcer and trimming of toenails   I & D EXTREMITY  07/02/2011   Procedure: IRRIGATION AND DEBRIDEMENT EXTREMITY;  Surgeon: Wylene Simmer, MD;  Location: [place];  Service: Orthopedics;  Laterality: Left;  I & D of Left Foot   I & D EXTREMITY  07/06/2011   Procedure: IRRIGATION AND DEBRIDEMENT EXTREMITY;  Surgeon: Wylene Simmer, MD;  Location: [place];  Service:  Orthopedics;  Laterality: Right;  IRRIGATION/DEBRIDEMENT RIGHT BELOW KNEE AMPUTATION   TUBAL LIGATION  2006     reports that she has never smoked. She has never used smokeless tobacco. She reports current alcohol use of about 2.0 standard drinks of alcohol per week. She reports that she does not use drugs.  No Known Allergies  Family History  Problem Relation Age of Onset   Diabetes Mother    Hypertension Mother    Hyperlipidemia Mother    Diabetes Father    Cancer Father        prostate   Hyperlipidemia Father    Hypertension Father    Diabetes Brother    Peripheral Artery Disease Brother        has had toes amputated   Diabetes Son      Prior to Admission medications   Medication Sig Start Date End Date Taking? Authorizing Provider  amLODipine (NORVASC) 5 MG tablet Take 1 tablet (5 mg total) by mouth daily. 01/28/20 01/27/21 Yes Vevelyn Francois, NP  aspirin EC 81 MG tablet Take 81 mg by mouth daily as needed for moderate pain. Swallow whole.   Yes [provider]  furosemide (LASIX) 40 MG tablet Take 160 mg by mouth daily. 04/02/20  Yes [provider]  gabapentin (NEURONTIN) 400 MG capsule Take 1 capsule (400 mg total) by mouth 2 (two) times daily. 01/28/20 01/27/21 Yes Vevelyn Francois, NP  isosorbide mononitrate (IMDUR) 30 MG 24 hr tablet Take 1 tablet (30 mg total) by mouth daily. 01/28/20 01/22/21 Yes Vevelyn Francois, NP  metFORMIN (GLUCOPHAGE) 500 MG tablet Take 1 tablet (500 mg total) by mouth 2 (two) times daily with a meal. Patient taking differently: Take 500 mg by mouth every other day. 01/28/20 01/27/21 Yes Vevelyn Francois, NP  metoprolol succinate (TOPROL-XL) 50 MG 24 hr tablet Take 1 tablet (50 mg total) by mouth daily. Take with or immediately following a meal. 01/28/20 01/22/21 Yes King, Diona Foley, NP  polyvinyl alcohol (LIQUIFILM TEARS) 1.4 % ophthalmic solution Place 1 drop into both eyes as needed for dry eyes.   Yes [provider]   rosuvastatin (CRESTOR) 20 MG tablet Take 1 tablet (20 mg total) by mouth daily. 01/28/20 01/22/21 Yes Vevelyn Francois, NP  Blood Glucose Monitoring Suppl (PRODIGY AUTOCODE BLOOD GLUCOSE) w/Device KIT 1 kit by Does not apply route in the morning, at noon, in the evening, and at bedtime. 06/06/19 06/05/20  Vevelyn Francois, NP    Physical Exam: Vitals:   06/12/20 1428 06/12/20 1440 06/12/20 1441 06/12/20 1443  BP:  (!) 116/49 (!) 116/49   Pulse: 63 66 65   Resp: (!) _0 Temp:      TempSrc:      SpO2: 94% (!) 86% 92% 92%  Weight:      Height:        Constitutional: NAD, calm, comfortable Vitals:   06/12/20 1428 06/12/20 1440 06/12/20 1441 06/12/20 1443  BP:  (!) 116/49 (!) 116/49   Pulse: 63 66 65   Resp: (!) _0 Temp:      TempSrc:      SpO2: 94% (!) 86% 92% 92%  Weight:      Height:       Eyes: PERRL, lids and conjunctivae pale ENMT: Mucous membranes are moist. Posterior pharynx clear of any exudate or lesions.Normal dentition.  Neck: normal, supple, no masses, no thyromegaly Respiratory: clear to auscultation bilaterally, no wheezing, no crackles. Normal respiratory effort. No accessory muscle use.  Cardiovascular: Regular rate and rhythm, no murmurs / rubs / gallops. 2+ extremity edema. 2+ pedal pulses. No carotid bruits.  Abdomen: no tenderness, no masses palpated. No hepatosplenomegaly. Bowel sounds positive.  Musculoskeletal: no clubbing / cyanosis. No joint deformity upper and lower extremities. Good ROM, no contractures. Normal muscle tone.  Skin: no rashes, lesions, ulcers. No induration Neurologic: CN 2-12 grossly intact. Sensation intact, DTR normal. Strength 5/5 in all 4.  Psychiatric: Normal judgment and insight. Alert and oriented x 3. Normal mood.     Labs on Admission: I have personally reviewed following labs and imaging studies  CBC: Recent Labs  Lab 06/06/20 1052 06/12/20 0755 06/12/20 1009  WBC 3.9* 4.1 4.4  NEUTROABS 2.7 3.0 2.9  HGB  7.7* 5.8* 6.3*  HCT 25.4* 18.8* 21.4*  MCV 91.7 90.8 94.7  PLT 7* <5* <5*   Basic Metabolic Panel: Recent Labs  Lab 06/06/20 1052 06/12/20 0755 06/12/20 1009  NA 138 133* 130*  K 4.6 6.6* 6.6*  CL 104 100 100  CO2 23 18* 20*  GLUCOSE 157* 215* 186*  BUN 49* 41* 42*  CREATININE 1.42* 2.63* 2.57*  CALCIUM 8.8* 8.1* 8.1*   GFR: Estimated Creatinine Clearance: 29.3 mL/min (A) (by C-G formula based on SCr of 2.57 mg/dL (H)). Liver Function Tests: Recent Labs  Lab 06/06/20 1052 06/12/20 0755 06/12/20 1009  AST _1 ALT _2 ALKPHOS 66 66 64  BILITOT 0.4 0.3 0.4  PROT 7.0 6.7 7.2  ALBUMIN 3.3* 3.3* 3.7   No results for input(s): LIPASE, AMYLASE in the last 168 hours. No results for input(s): AMMONIA in the last 168 hours. Coagulation Profile: No results for input(s): INR, PROTIME in the last 168 hours. Cardiac Enzymes: No results for input(s): CKTOTAL, CKMB, CKMBINDEX, TROPONINI in the last 168 hours. BNP (last 3 results) No results for input(s): PROBNP in the last 8760 hours. HbA1C: No results for input(s): HGBA1C in the last 72 hours. CBG: Recent Labs  Lab 06/12/20 1234  GLUCAP 108*   Lipid Profile: No results for input(s): CHOL, HDL, LDLCALC, TRIG, CHOLHDL, LDLDIRECT in the last 72 hours. Thyroid Function Tests: No results for input(s): TSH, T4TOTAL, FREET4, T3FREE, THYROIDAB in the last 72 hours. Anemia Panel: Recent Labs    06/12/20 1350  VITAMINB12 103*  FOLATE 5.5*  FERRITIN 43  TIBC 324  IRON 14*  RETICCTPCT 4.7*   Urine analysis:    Component Value Date/Time   COLORURINE YELLOW 03/09/2020 1624   APPEARANCEUR HAZY (A) 03/09/2020 1624   LABSPEC 1.010 03/09/2020 1624   PHURINE 7.0 03/09/2020 1624   GLUCOSEU NEGATIVE 03/09/2020 1624   HGBUR SMALL (A) 03/09/2020 1624   BILIRUBINUR NEGATIVE 03/09/2020 1624   KETONESUR NEGATIVE 03/09/2020 1624   PROTEINUR 30 (A) 03/09/2020 1624   UROBILINOGEN 2.0 (H) 07/08/2011 0051   NITRITE NEGATIVE  03/09/2020 1624   LEUKOCYTESUR LARGE (A) 03/09/2020 1624  Radiological Exams on Admission: DG Chest Portable 1 View  Result Date: 06/12/2020 CLINICAL DATA:  palpitations EXAM: PORTABLE CHEST 1 VIEW COMPARISON:  Chest radiograph 03/09/2020 FINDINGS: Unchanged, enlarged cardiac silhouette. There is no focal airspace consolidation. There is no large pleural effusion or visible pneumothorax. No acute osseous abnormality. IMPRESSION: Unchanged cardiomegaly.  No focal airspace disease. Electronically Signed   By: Maurine Simmering   On: 06/12/2020 10:33    EKG: Independently reviewed.  Sinus, nonspecific ST changes on multiple leads.  Assessment/Plan Active Problems:   Severe anemia  (please populate well all problems here in Problem List. (For example, if patient is on BP meds at home and you resume or decide to hold them, it is a problem that needs to be her. Same for CAD, COPD, HLD and so on)  Severe thrombocytopenia -Getting 1 unit of single donor platelet -Repeat CBC tomorrow. -Hematology follows.  Acute on chronic iron deficiency anemia -Secondary to severe nosebleed.  Likely secondary to severe thrombocytopenia. -We will give PRBC after PLT transfusion  AKI on CKD stage II -Appears to be hypotensive, hold all her BP meds for now. -Check renal ultrasound  Acute on chronic diastolic CHF -Has some signs of fluid overload, probably multiple factorial from worsening of anemia, over stringent control of BP. -Hold BP meds for today, change Lasix to IV 40 mg twice daily  Hyponatremia -2/2 CHF decompensation likely, hypervolemic. -IV Lasix.  HTN with hypotension -As above  IIDM -Discontinue metformin given worsening of kidney function  Diabetic neuropathy -Cut down gabapentin dosage   DVT prophylaxis: SCD Code Status: Full code Family Communication: None at bedside Disposition Plan: Expect more than 2 midnight hospital stay, to treat ITP, thrombocytopenia and CHF and  AKI. Consults called: Hematology and nephrology Admission status: Telemetry admit   Lequita Halt MD [facility] Pager [phone]  06/12/2020, 4:14 PM

## 2020-06-12 NOTE — Progress Notes (Signed)
RN transported pt to ED via personal wheelchair.  SBAR report given to Aram Beecham, Charity fundraiser.

## 2020-06-12 NOTE — ED Provider Notes (Signed)
[facility] DEPT Provider Note   CSN: [id number] Arrival date & time: 06/12/20  0488     History Chief Complaint  Patient presents with   abnormal lab results    [name] is a 56 y.o. female with a past medical history significant for congestive heart failure, chronic indwelling Foley catheter, hypertension, history of CVA, type 2 diabetes, and chronic idiopathic thrombocytopenia presents to the ED from the cancer center due to low hemoglobin and hyperkalemia.  Per chart review, patient's hemoglobin 5.8 with platelet count less than 5 with potassium at 6.6 with worsening renal function. She was at the Cancer center today for IVIG to improve her platelet count; however, did not received infusion due to critical lab values. Patient admits to interrmittent, heavy nosebleeds over the weekend. Last nosebleed on Monday. None since. Denies bleeding gums. She admits to worsening fatigue for the past few days. She also endorses intermittent palpitations. Denies chest pain and shortness of breath. Denies melena, hematochezia, and hematemesis. No previous blood transfusions. She is on ASA 69m, but no other blood thinners.   History obtained from patient and past medical records. No interpreter used during encounter.       Past Medical History:  Diagnosis Date   Acute exacerbation of CHF (congestive heart failure) ([place] 04/11/2019   Anemia    Arthritis    "spine, hands" (11/25/2015)   Back pain    "all my back; probably 3 times/week" (11/25/2015)   Chronic indwelling Foley catheter 03/04/2017   Diabetic foot ulcer associated with type 2 diabetes mellitus ([facility]    Hypertension    Intracerebral hemorrhage ([place] As a teenager    States she had burst blood vessel as teenager, now with resultant minor visual field and hearing deficits   Neuropathy    Stroke ([facility] ~ 1982   "my feeling on my RLE, right lower eye vision,  & hearing out of my right ear not the same  since" (11/25/2015)   Type II diabetes mellitus ([facility]     Patient Active Problem List   Diagnosis Date Noted   Chronic ITP (idiopathic thrombocytopenia) ([place] 03/08/2020   Acute on chronic respiratory failure with hypoxia ([facility] 03/08/2020   Cellulitis of foot 08/02/2019   Nausea and vomiting 06/26/2019   Hematuria 06/26/2019   Hypertensive urgency 089/16/9450  Acute diastolic CHF (congestive heart failure) (HCC)    CHF (congestive heart failure) ([facility] 04/11/2019   Acute exacerbation of CHF (congestive heart failure) ([facility] 04/11/2019   Leukopenia 04/11/2019   History of ITP 04/11/2019   Idiopathic thrombocytopenic purpura (ITP) ([facility] 11/16/2018   Onychomycosis of toenail 10/16/2018   Cataract of both eyes 10/16/2018   Tinea pedis of left foot 10/16/2018   Decreased vision in both eyes 10/16/2018   CKD (chronic kidney disease) stage 3, GFR 30-59 ml/min    Cellulitis of left lower extremity 01/25/2018   Hyperkalemia 01/25/2018   Poor compliance 03/08/2017   Hypoxia 03/07/2017   Thyroid nodule 03/07/2017   Chronic indwelling Foley catheter 03/04/2017   Type II diabetes mellitus ([facility]    Femur fracture ([facility] 03/02/2017   Orthostatic dizziness    Urinary tract infection without hematuria    Enterococcus faecalis infection    Near syncope 11/25/2015   Dehydration 11/25/2015   Orthostatic hypotension 11/25/2015   UTI (urinary tract infection) 11/25/2015   Acute worsening of stage 3 chronic kidney disease ([facility] 09/24/2015   Diabetes mellitus due to underlying condition with chronic kidney disease, without  long-term current use of insulin (HCC)    Fever    Status post below knee amputation of right lower extremity (HCC)    Diabetic peripheral neuropathy (HCC)    Neuropathic pain    Benign essential HTN    Folliculitis    Slow transit constipation    Anemia of chronic disease    Bilateral hydronephrosis    Urinary retention    CKD (chronic kidney disease)    Uncontrolled type  2 diabetes mellitus with diabetic nephropathy, without long-term current use of insulin ([place])    Vasculopathy    Debility 09/04/2015   DM type 2 with diabetic peripheral neuropathy (HCC)    S/P BKA (below knee amputation) unilateral (HCC)    Medical non-compliance    Benign skin lesion of multiple sites    Tachypnea    Acute blood loss anemia    Neurogenic bladder    Occult blood positive stool    Rectovaginal fistula    Controlled diabetes mellitus type 2 with complications ([place])    Acute on chronic renal failure (HCC)    Hydronephrosis determined by ultrasound    Papular rash    Uncontrolled type 2 diabetes mellitus with complication (HCC)    Paresthesia    Thrombocytopenia ([place]) 08/23/2015   Pressure ulcer 08/23/2015   Severe anemia 08/23/2015   Lower urinary tract infectious disease 08/23/2015   Absolute anemia 08/23/2015   Peripheral vascular disease, unspecified ([place]) 04/27/2012   Unilateral complete BKA ([place]) 07/09/2011   Gas gangrene ([place]) 07/02/2011   Sepsis(995.91) 07/02/2011   Acute kidney injury superimposed on chronic kidney disease ([place]) 07/02/2011   Diabetes mellitus ([place]) 11/22/2010   Diabetic foot ulcer associated with type 2 diabetes mellitus ([place])    Essential hypertension    Arthritis    Neuropathy ([place])    Intracerebral hemorrhage ([place])     Past Surgical History:  Procedure Laterality Date   AMPUTATION  11/24/2010   Procedure: AMPUTATION FOOT;  Surgeon: Wylene Simmer, MD;  Location: [facility];  Service: Orthopedics;  Laterality: Right;  Right  FOOT TRANS METATARSAL AMPUTATION   AMPUTATION  07/02/2011   Procedure: AMPUTATION BELOW KNEE;  Surgeon: Wylene Simmer, MD;  Location: [facility];  Service: Orthopedics;  Laterality: Right;   APPLICATION OF WOUND VAC  07/02/2011   Procedure: APPLICATION OF WOUND VAC;  Surgeon: Wylene Simmer, MD;  Location: [facility];  Service: Orthopedics;  Laterality: Right;   BRAIN SURGERY  1980's   Previous brain surgery in [place], [place]  2004; 2006   COLONOSCOPY N/A 09/17/2015   Procedure: COLONOSCOPY;  Surgeon: Wilford Corner, MD;  Location[facility] ENDOSCOPY;  Service: Endoscopy;  Laterality: N/A;   ESOPHAGOGASTRODUODENOSCOPY N/A 09/17/2015   Procedure: ESOPHAGOGASTRODUODENOSCOPY (EGD);  Surgeon: Wilford Corner, MD;  Location: [facility] ENDOSCOPY;  Service: Endoscopy;  Laterality: N/A;   I & D EXTREMITY  11/24/2010   Procedure: IRRIGATION AND DEBRIDEMENT EXTREMITY;  Surgeon: Wylene Simmer, MD;  Location: [facility] [place];  Service: Orthopedics;  Laterality: Left;  Debriedment of left plantar ulcer and trimming of toenails   I & D EXTREMITY  07/02/2011   Procedure: IRRIGATION AND DEBRIDEMENT EXTREMITY;  Surgeon: Wylene Simmer, MD;  Location: [place];  Service: Orthopedics;  Laterality: Left;  I & D of Left Foot   I & D EXTREMITY  07/06/2011   Procedure: IRRIGATION AND DEBRIDEMENT EXTREMITY;  Surgeon: Wylene Simmer, MD;  Location: [facility];  Service: Orthopedics;  Laterality: Right;  IRRIGATION/DEBRIDEMENT RIGHT BELOW KNEE AMPUTATION  TUBAL LIGATION  2006     OB History   No obstetric history on file.     Family History  Problem Relation Age of Onset   Diabetes Mother    Hypertension Mother    Hyperlipidemia Mother    Diabetes Father    Cancer Father        prostate   Hyperlipidemia Father    Hypertension Father    Diabetes Brother    Peripheral Artery Disease Brother        has had toes amputated   Diabetes Son     Social History   Tobacco Use   Smoking status: Never   Smokeless tobacco: Never  Vaping Use   Vaping Use: Never used  Substance Use Topics   Alcohol use: Yes    Alcohol/week: 2.0 standard drinks    Types: 2 Glasses of wine per week   Drug use: No    Home Medications Prior to Admission medications   Medication Sig Start Date End Date Taking? Authorizing Provider  amLODipine (NORVASC) 5 MG tablet Take 1 tablet (5 mg total) by mouth daily. 01/28/20 01/27/21 Yes Vevelyn Francois, NP  aspirin EC 81 MG tablet  Take 81 mg by mouth daily as needed for moderate pain. Swallow whole.   Yes [provider]  furosemide (LASIX) 40 MG tablet Take 160 mg by mouth daily. 04/02/20  Yes [provider]  gabapentin (NEURONTIN) 400 MG capsule Take 1 capsule (400 mg total) by mouth 2 (two) times daily. 01/28/20 01/27/21 Yes Vevelyn Francois, NP  isosorbide mononitrate (IMDUR) 30 MG 24 hr tablet Take 1 tablet (30 mg total) by mouth daily. 01/28/20 01/22/21 Yes Vevelyn Francois, NP  metFORMIN (GLUCOPHAGE) 500 MG tablet Take 1 tablet (500 mg total) by mouth 2 (two) times daily with a meal. Patient taking differently: Take 500 mg by mouth every other day. 01/28/20 01/27/21 Yes Vevelyn Francois, NP  metoprolol succinate (TOPROL-XL) 50 MG 24 hr tablet Take 1 tablet (50 mg total) by mouth daily. Take with or immediately following a meal. 01/28/20 01/22/21 Yes King, Diona Foley, NP  polyvinyl alcohol (LIQUIFILM TEARS) 1.4 % ophthalmic solution Place 1 drop into both eyes as needed for dry eyes.   Yes [provider]  rosuvastatin (CRESTOR) 20 MG tablet Take 1 tablet (20 mg total) by mouth daily. 01/28/20 01/22/21 Yes Vevelyn Francois, NP  Blood Glucose Monitoring Suppl (PRODIGY AUTOCODE BLOOD GLUCOSE) w/Device KIT 1 kit by Does not apply route in the morning, at noon, in the evening, and at bedtime. 06/06/19 06/05/20  Vevelyn Francois, NP    Allergies    Patient has no known allergies.  Review of Systems   Review of Systems  Constitutional:  Positive for fatigue. Negative for chills and fever.  HENT:  Positive for nosebleeds.   Respiratory:  Negative for shortness of breath.   Cardiovascular:  Positive for palpitations. Negative for chest pain.  Gastrointestinal:  Negative for abdominal pain, blood in stool, diarrhea, nausea and vomiting.  All other systems reviewed and are negative.  Physical Exam Updated Vital Signs BP (!) 116/49 (BP Location: Left Arm)   Pulse 65   Temp (!) 97.5 F (36.4 C) (Oral)   Resp 18    Ht '5\' 7"'  (1.702 m)   Wt 90.7 kg   LMP  (LMP Unknown)   SpO2 92%   BMI 31.32 kg/m   Physical Exam Vitals and nursing note reviewed.  Constitutional:  General: She is not in acute distress.    Appearance: She is not ill-appearing.  HENT:     Head: Normocephalic.     Nose:     Comments: Dried blood in left nostril. No active bleeding Eyes:     Pupils: Pupils are equal, round, and reactive to light.  Cardiovascular:     Rate and Rhythm: Regular rhythm. Bradycardia present.     Pulses: Normal pulses.     Heart sounds: Normal heart sounds. No murmur heard.   No friction rub. No gallop.  Pulmonary:     Effort: Pulmonary effort is normal.     Breath sounds: Normal breath sounds.  Abdominal:     General: Abdomen is flat. There is no distension.     Palpations: Abdomen is soft.     Tenderness: There is no abdominal tenderness. There is no guarding or rebound.  Genitourinary:    Comments: Rectal exam performed with chaperone in room. Light brown stool.  Musculoskeletal:        General: Normal range of motion.     Cervical back: Neck supple.     Comments: Right BKA. 2+ pitting edema in LLE  Skin:    General: Skin is warm and dry.  Neurological:     General: No focal deficit present.     Mental Status: She is alert.  Psychiatric:        Mood and Affect: Mood normal.        Behavior: Behavior normal.    ED Results / Procedures / Treatments   Labs (all labs ordered are listed, but only abnormal results are displayed) Labs Reviewed  BRAIN NATRIURETIC PEPTIDE - Abnormal; Notable for the following components:      Result Value   B Natriuretic Peptide 774.5 (*)    All other components within normal limits  CBC WITH DIFFERENTIAL/PLATELET - Abnormal; Notable for the following components:   RBC 2.26 (*)    Hemoglobin 6.3 (*)    HCT 21.4 (*)    MCHC 29.4 (*)    RDW 17.8 (*)    Platelets <5 (*)    nRBC 0.5 (*)    All other components within normal limits  COMPREHENSIVE  METABOLIC PANEL - Abnormal; Notable for the following components:   Sodium 130 (*)    Potassium 6.6 (*)    CO2 20 (*)    Glucose, Bld 186 (*)    BUN 42 (*)    Creatinine, Ser 2.57 (*)    Calcium 8.1 (*)    GFR, Estimated 22 (*)    All other components within normal limits  VITAMIN B12 - Abnormal; Notable for the following components:   Vitamin B-12 103 (*)    All other components within normal limits  FOLATE - Abnormal; Notable for the following components:   Folate 5.5 (*)    All other components within normal limits  IRON AND TIBC - Abnormal; Notable for the following components:   Iron 14 (*)    Saturation Ratios 4 (*)    All other components within normal limits  RETICULOCYTES - Abnormal; Notable for the following components:   Retic Ct Pct 4.7 (*)    RBC. 2.15 (*)    Immature Retic Fract 24.7 (*)    All other components within normal limits  POC OCCULT BLOOD, ED - Abnormal; Notable for the following components:   Fecal Occult Bld POSITIVE (*)    All other components within normal limits  CBG MONITORING, ED -  Abnormal; Notable for the following components:   Glucose-Capillary 108 (*)    All other components within normal limits  RESP PANEL BY RT-PCR (FLU A&B, COVID) ARPGX2  FERRITIN  BASIC METABOLIC PANEL  HEMOGLOBIN A1C  URINALYSIS, ROUTINE W REFLEX MICROSCOPIC  TYPE AND SCREEN  PREPARE PLATELET PHERESIS  PREPARE RBC (CROSSMATCH)  TROPONIN I (HIGH SENSITIVITY)    EKG EKG Interpretation  Date/Time:  Thursday June 12 2020 09:15:05 EDT Ventricular Rate:  56 PR Interval:  195 QRS Duration: 86 QT Interval:  481 QTC Calculation: 465 R Axis:   40 Text Interpretation: Sinus rhythm Consider left atrial enlargement Low voltage, precordial leads Nonspecific T abnormalities, anterior leads No significant change since last tracing Confirmed by Fredia Sorrow [phone]) on 06/12/2020 9:29:47 AM  Radiology DG Chest Portable 1 View  Result Date: 06/12/2020 CLINICAL DATA:   palpitations EXAM: PORTABLE CHEST 1 VIEW COMPARISON:  Chest radiograph 03/09/2020 FINDINGS: Unchanged, enlarged cardiac silhouette. There is no focal airspace consolidation. There is no large pleural effusion or visible pneumothorax. No acute osseous abnormality. IMPRESSION: Unchanged cardiomegaly.  No focal airspace disease. Electronically Signed   By: Maurine Simmering   On: 06/12/2020 10:33    Procedures .Critical Care  Date/Time: 06/12/2020 3:19 PM Performed by: Suzy Bouchard, PA-C Authorized by: Suzy Bouchard, PA-C   Critical care provider statement:    Critical care time (minutes):  36   Critical care was necessary to treat or prevent imminent or life-threatening deterioration of the following conditions:  Circulatory failure and endocrine crisis   Critical care was time spent personally by me on the following activities:  Discussions with consultants, evaluation of patient's response to treatment, examination of patient, ordering and performing treatments and interventions, ordering and review of laboratory studies, ordering and review of radiographic studies, pulse oximetry, re-evaluation of patient's condition, obtaining history from patient or surrogate and review of old charts   I assumed direction of critical care for this patient from another provider in my specialty: no     Care discussed with: admitting provider     Medications Ordered in ED Medications  0.9 %  sodium chloride infusion (0 mL/hr Intravenous Hold 06/12/20 1200)  aspirin EC tablet 81 mg (has no administration in time range)  amLODipine (NORVASC) tablet 5 mg (has no administration in time range)  isosorbide mononitrate (IMDUR) 24 hr tablet 30 mg (has no administration in time range)  metoprolol succinate (TOPROL-XL) 24 hr tablet 50 mg (has no administration in time range)  rosuvastatin (CRESTOR) tablet 20 mg (has no administration in time range)  gabapentin (NEURONTIN) capsule 100 mg (has no administration in  time range)  polyvinyl alcohol (LIQUIFILM TEARS) 1.4 % ophthalmic solution 1 drop (has no administration in time range)  insulin aspart (novoLOG) injection 0-9 Units (has no administration in time range)  acetaminophen (TYLENOL) tablet 650 mg (has no administration in time range)    Or  acetaminophen (TYLENOL) suppository 650 mg (has no administration in time range)  sodium bicarbonate tablet 1,300 mg (has no administration in time range)  furosemide (LASIX) injection 40 mg (has no administration in time range)  0.9 %  sodium chloride infusion (Manually program via Guardrails IV Fluids) (has no administration in time range)  insulin aspart (novoLOG) injection 5 Units (5 Units Intravenous Given 06/12/20 1132)    And  dextrose 50 % solution 50 mL (50 mLs Intravenous Given 06/12/20 1132)  sodium zirconium cyclosilicate (LOKELMA) packet 10 g (10 g Oral Given 06/12/20 1334)  ED Course  I have reviewed the triage vital signs and the nursing notes.  Pertinent labs & imaging results that were available during my care of the patient were reviewed by me and considered in my medical decision making (see chart for details).  Clinical Course as of 06/12/20 1524  Thu Jun 12, 2020  1051 Hemoglobin(!!): 6.3 [CA]  1051 Platelets(!!): <5 [CA]  1051 Creatinine(!): 2.57 [CA]  1051 Glucose(!): 186 [CA]  1051 BUN(!): 42 [CA]  1051 GFR, Estimated(!): 22 [CA]  1522 Fecal Occult Blood, POC(!): POSITIVE [CA]    Clinical Course User Index [CA] Suzy Bouchard, PA-C   MDM Rules/Calculators/A&P                         55 year old female presents to the ED from cancer center due to low hemoglobin and hyperkalemia.  Patient admits to intermittent nosebleeds over the weekend with increased fatigue and intermittent palpitations.  She has a history of idiopathic thrombocytopenia and receives IVIG at the cancer center however, she was unable to receive infusion today due to critical lab values.  She denies melena,  hematochezia, and hematemesis.  Upon arrival, patient bradycardic with otherwise normal vitals.  Patient nontoxic-appearing.  Physical exam reassuring.  Dried blood in left nostril with no active bleeding.  2+ pitting edema in lower extremities.  CBC and CMP ordered earlier today at cancer center which demonstrates thrombocytopenia with platelet count less than 5, anemia with hemoglobin at 5.8.  CMP significant for hyponatremia 133, hyperkalemia at 6.6, hyperglycemia 215.  No anion gap.  Doubt DKA.  Worsening renal function with creatinine at 2.63 and BUN at 41 baseline around 1.4. BNP ordered due to lower extremity edema. Type and screen. EKG, troponin, and CXR due to palpitation which are likely due to symptomatic anemia; however, will rule out cardiac etiology.   10:04 AM RN concerned about possible hemolyzed earlier labs due to blown vein. Will repeat CBC and CMP to confirm values.  Repeat CBC significant for anemia with hemoglobin at 6.3.  Platelets less than 5.  CMP significant for hyperkalemia at 6.6, hyponatremia 130, hyperglycemia 186.  Worsening renal function with creatinine of 2.57 and BUN at 42.  Insulin given for hyperkalemia due to hyperglycemia as well.  EKG personally reviewed which demonstrates normal sinus rhythm with no signs of acute ischemia.  Nonspecific T wave abnormalities.  No peaked T waves.  Chest x-ray personally reviewed which demonstrates stable cardiomegaly with no signs of pneumonia, pneumothorax, or widened mediastinum.  BNP elevated at 774.  Will discuss with hospitalist in regards to Lasix given poor renal function. Fecal occult positive. Discussed case with Dr. Rogene Houston who agrees with assessment and plan.  Discussed case with Dr. Lorenso Courier with oncology who recommends platelet transfusion prior to PRBC. Heme/oncology will follow during patient's admission. Platelet transfusion started in the ED. Will hold PRBC until platelet transfusion is finished.   Discussed case with  Dr. Roosevelt Locks with [facility] who agrees to admit patient.  Final Clinical Impression(s) / ED Diagnoses Final diagnoses:  Symptomatic anemia    Rx / DC Orders ED Discharge Orders     None        Suzy Bouchard, PA-C 06/12/20 1524    Fredia Sorrow, MD 06/18/20 1609

## 2020-06-12 NOTE — ED Notes (Signed)
Awaiting medications to be verified by pharmacy 

## 2020-06-12 NOTE — Telephone Encounter (Signed)
CRITICAL VALUE STICKER  CRITICAL VALUE: hemoglobin 5.8; platelet <5  RECEIVER (on-site recipient of call): Juanita Laster, RN  DATE & TIME NOTIFIED: 06/12/20 @ 0815  MESSENGER (representative from lab): Vonna Kotyk  MD NOTIFIED: Raquel Sarna (Gudena's RN)  TIME OF NOTIFICATION: 8381  RESPONSE: MD notified

## 2020-06-12 NOTE — ED Notes (Signed)
Bed ready upstairs awaiting RN Windell Moulding to accept pt. Pt aware of delay.

## 2020-06-12 NOTE — ED Notes (Addendum)
Spoke to charge [address] [employer] regarding when pt can come upstairs to clean bed. Awaiting call back.

## 2020-06-12 NOTE — Progress Notes (Signed)
Patient presented to infusion room for IVIG infusion. In review of lab results, Dr. Pamelia Hoit advised to transfer patient to ED for further evaluation of neutropenia, anemia, and hyperkalemia. Patient escorted to ED via wheelchair and taken to room 16. Report given to ED RN by Raquel Sarna, RN.

## 2020-06-13 ENCOUNTER — Inpatient Hospital Stay (HOSPITAL_COMMUNITY): Payer: Medicare Other

## 2020-06-13 DIAGNOSIS — R040 Epistaxis: Secondary | ICD-10-CM | POA: Diagnosis not present

## 2020-06-13 DIAGNOSIS — D500 Iron deficiency anemia secondary to blood loss (chronic): Secondary | ICD-10-CM | POA: Diagnosis not present

## 2020-06-13 DIAGNOSIS — E875 Hyperkalemia: Secondary | ICD-10-CM | POA: Diagnosis not present

## 2020-06-13 DIAGNOSIS — D61818 Other pancytopenia: Secondary | ICD-10-CM | POA: Diagnosis not present

## 2020-06-13 DIAGNOSIS — D649 Anemia, unspecified: Secondary | ICD-10-CM | POA: Diagnosis not present

## 2020-06-13 DIAGNOSIS — D693 Immune thrombocytopenic purpura: Secondary | ICD-10-CM | POA: Diagnosis not present

## 2020-06-13 DIAGNOSIS — I130 Hypertensive heart and chronic kidney disease with heart failure and stage 1 through stage 4 chronic kidney disease, or unspecified chronic kidney disease: Secondary | ICD-10-CM | POA: Diagnosis not present

## 2020-06-13 DIAGNOSIS — Z20822 Contact with and (suspected) exposure to covid-19: Secondary | ICD-10-CM | POA: Diagnosis not present

## 2020-06-13 DIAGNOSIS — N1832 Chronic kidney disease, stage 3b: Secondary | ICD-10-CM | POA: Diagnosis not present

## 2020-06-13 DIAGNOSIS — I959 Hypotension, unspecified: Secondary | ICD-10-CM | POA: Diagnosis not present

## 2020-06-13 DIAGNOSIS — I5033 Acute on chronic diastolic (congestive) heart failure: Secondary | ICD-10-CM | POA: Diagnosis not present

## 2020-06-13 DIAGNOSIS — E785 Hyperlipidemia, unspecified: Secondary | ICD-10-CM | POA: Diagnosis not present

## 2020-06-13 DIAGNOSIS — E1122 Type 2 diabetes mellitus with diabetic chronic kidney disease: Secondary | ICD-10-CM | POA: Diagnosis not present

## 2020-06-13 DIAGNOSIS — N390 Urinary tract infection, site not specified: Secondary | ICD-10-CM | POA: Diagnosis not present

## 2020-06-13 DIAGNOSIS — E871 Hypo-osmolality and hyponatremia: Secondary | ICD-10-CM | POA: Diagnosis not present

## 2020-06-13 DIAGNOSIS — N179 Acute kidney failure, unspecified: Secondary | ICD-10-CM | POA: Diagnosis not present

## 2020-06-13 DIAGNOSIS — Z23 Encounter for immunization: Secondary | ICD-10-CM | POA: Diagnosis not present

## 2020-06-13 LAB — HEMOGLOBIN AND HEMATOCRIT, BLOOD
HCT: 25 % — ABNORMAL LOW (ref 36.0–46.0)
Hemoglobin: 7.8 g/dL — ABNORMAL LOW (ref 12.0–15.0)

## 2020-06-13 LAB — SODIUM, URINE, RANDOM: Sodium, Ur: <10 mmol/L

## 2020-06-13 LAB — HEPATITIS B SURFACE ANTIGEN: Hepatitis B Surface Ag: NONREACTIVE

## 2020-06-13 LAB — URINALYSIS, ROUTINE W REFLEX MICROSCOPIC
Bilirubin Urine: NEGATIVE
Glucose, UA: NEGATIVE mg/dL
Ketones, ur: NEGATIVE mg/dL
Nitrite: NEGATIVE
Protein, ur: NEGATIVE mg/dL
Specific Gravity, Urine: 1.006 (ref 1.005–1.030)
pH: 5 (ref 5.0–8.0)

## 2020-06-13 LAB — BASIC METABOLIC PANEL
Anion gap: 9 (ref 5–15)
BUN: 48 mg/dL — ABNORMAL HIGH (ref 6–20)
CO2: 24 mmol/L (ref 22–32)
Calcium: 7.8 mg/dL — ABNORMAL LOW (ref 8.9–10.3)
Chloride: 99 mmol/L (ref 98–111)
Creatinine, Ser: 2.37 mg/dL — ABNORMAL HIGH (ref 0.44–1.00)
GFR, Estimated: 24 mL/min — ABNORMAL LOW (ref >60)
Glucose, Bld: 115 mg/dL — ABNORMAL HIGH (ref 70–99)
Potassium: 4.7 mmol/L (ref 3.5–5.1)
Sodium: 132 mmol/L — ABNORMAL LOW (ref 135–145)

## 2020-06-13 LAB — PROTEIN / CREATININE RATIO, URINE
Creatinine, Urine: 73.46 mg/dL
Protein Creatinine Ratio: 0.11 mg/mg{Cre} (ref 0.00–0.15)
Total Protein, Urine: 8 mg/dL

## 2020-06-13 LAB — CBC
HCT: 22.5 % — ABNORMAL LOW (ref 36.0–46.0)
Hemoglobin: 6.8 g/dL — CL (ref 12.0–15.0)
MCH: 27.9 pg (ref 26.0–34.0)
MCHC: 30.2 g/dL (ref 30.0–36.0)
MCV: 92.2 fL (ref 80.0–100.0)
Platelets: <5 10*3/uL — CL (ref 150–400)
RBC: 2.44 MIL/uL — ABNORMAL LOW (ref 3.87–5.11)
RDW: 17.3 % — ABNORMAL HIGH (ref 11.5–15.5)
WBC: 3.5 10*3/uL — ABNORMAL LOW (ref 4.0–10.5)
nRBC: 0 % (ref 0.0–0.2)

## 2020-06-13 LAB — ECHOCARDIOGRAM COMPLETE
Area-P 1/2: 3.6 cm2
Height: 67 in
S' Lateral: 2.8 cm
Weight: 3200 oz

## 2020-06-13 LAB — PREPARE PLATELET PHERESIS: Unit division: 0

## 2020-06-13 LAB — HEPATITIS C ANTIBODY: HCV Ab: NONREACTIVE

## 2020-06-13 LAB — BPAM PLATELET PHERESIS
Blood Product Expiration Date: 202206122359
ISSUE DATE / TIME: 202206091314
Unit Type and Rh: 6200

## 2020-06-13 LAB — ANTI-DNA ANTIBODY, DOUBLE-STRANDED: ds DNA Ab: <1 IU/mL (ref 0–9)

## 2020-06-13 LAB — PREPARE RBC (CROSSMATCH)

## 2020-06-13 LAB — C4 COMPLEMENT: Complement C4, Body Fluid: 27 mg/dL (ref 12–38)

## 2020-06-13 LAB — CORTISOL: Cortisol, Plasma: 9.1 ug/dL

## 2020-06-13 LAB — OSMOLALITY: Osmolality: 298 mOsm/kg — ABNORMAL HIGH (ref 275–295)

## 2020-06-13 LAB — GLUCOSE, CAPILLARY
Glucose-Capillary: 102 mg/dL — ABNORMAL HIGH (ref 70–99)
Glucose-Capillary: 179 mg/dL — ABNORMAL HIGH (ref 70–99)
Glucose-Capillary: 146 mg/dL — ABNORMAL HIGH (ref 70–99)
Glucose-Capillary: 133 mg/dL — ABNORMAL HIGH (ref 70–99)

## 2020-06-13 LAB — ANCA TITERS
Atypical P-ANCA titer: <1:20 {titer}
C-ANCA: <1:20 {titer}
P-ANCA: <1:20 {titer}

## 2020-06-13 LAB — OSMOLALITY, URINE: Osmolality, Ur: 193 mOsm/kg — ABNORMAL LOW (ref 300–900)

## 2020-06-13 LAB — C3 COMPLEMENT: C3 Complement: 117 mg/dL (ref 82–167)

## 2020-06-13 LAB — GLOMERULAR BASEMENT MEMBRANE ANTIBODIES: GBM Ab: 3 units (ref 0–20)

## 2020-06-13 LAB — CREATININE, URINE, RANDOM: Creatinine, Urine: 76.16 mg/dL

## 2020-06-13 MED ORDER — IMMUNE GLOBULIN (HUMAN) 10 GM/100ML IV SOLN
1.0000 g/kg | INTRAVENOUS | Status: DC
  Filled 2020-06-13: qty 900

## 2020-06-13 MED ORDER — IMMUNE GLOBULIN (HUMAN) 10 GM/100ML IV SOLN
1.0000 g/kg | INTRAVENOUS | Status: AC
  Administered 2020-06-14 – 2020-06-15 (×2): 90 g via INTRAVENOUS
  Filled 2020-06-13: qty 900
  Filled 2020-06-13: qty 100

## 2020-06-13 MED ORDER — DIPHENHYDRAMINE HCL 25 MG PO CAPS
25.0000 mg | ORAL_CAPSULE | ORAL | Status: DC
  Administered 2020-06-14: 25 mg via ORAL
  Filled 2020-06-13: qty 1

## 2020-06-13 MED ORDER — ACETAMINOPHEN 325 MG PO TABS
650.0000 mg | ORAL_TABLET | ORAL | Status: DC
  Administered 2020-06-14: 650 mg via ORAL
  Filled 2020-06-13: qty 2

## 2020-06-13 MED ORDER — SODIUM CHLORIDE 0.9% IV SOLUTION: Freq: Once | INTRAVENOUS | Status: DC

## 2020-06-13 NOTE — Progress Notes (Signed)
  Echocardiogram 2D Echocardiogram has been performed.  Seydina Holliman G Marvelle Span 06/13/2020, 2:56 PM

## 2020-06-13 NOTE — Progress Notes (Signed)
Dr. Arville Care notified of patient's Hgb of 6.8 after 1 unit of PRBC, waiting on call back.

## 2020-06-13 NOTE — Progress Notes (Addendum)
[facility] NEPHROLOGY PROGRESS NOTE  Assessment/ Plan: Pt is a 55 y.o. yo female  HTN HLD DM neuropathy, PAD,  HFpEF, CKD3 with a history of ITP and receives IVIG at the cancer center, sent to ER for abnormal lab including platelet less than 5 and hemoglobin 5.8.  We are consulted for AKI on CKD.  #Acute kidney injury on CKD 3 (b/l cr 1.4-1.5), reportedly saw Dr. Signe Colt back in 2017 and then lost follow-up.  The AKI likely hemodynamically mediated in the setting of severe anemia and may have osmotic diuresis related with IVIG.  Pending urinalysis, I will order bladder scan as she has external Foley catheter.  Kidney ultrasound unremarkable.  No exact urine output recorded. Noted creatinine level is downtrending with improvement of potassium level.  She does not look grossly volume overload and plan for IVIG therefore I will discontinue IV Lasix.  Expect continued renal recovery.  Pending extensive serologies which were sent during initial consult yesterday.  Complements are negative.  #Hyperkalemia: Improved after medical management.  #Thrombocytopenia with history of ITP:  IVIG/platelet infusion etc as per hematology/primary care.  She had epistaxis recently.  # Anemia: Transfused PRBC, IV iron.  Management per hematologist.  #Hypertension: Blood pressure acceptable.  On hydralazine.  Subjective: Seen and examined.  Chart reviewed.  Patient reports feeling good without any complaint.  Denies nausea, vomiting, chest pain, shortness of breath.  No active bleeding.  She denies urinary complaint. Objective Vital signs in last 24 hours: Vitals:   06/12/20 2051 06/13/20 0040 06/13/20 0210 06/13/20 0522  BP: (!) 114/54 (!) 128/58 (!) 110/52 (!) 120/57  Pulse: 69 72 67 66  Resp: 18 16    Temp: 98.1 F (36.7 C) 98.1 F (36.7 C) 98.7 F (37.1 C) 97.9 F (36.6 C)  TempSrc: Oral Oral Oral Oral  SpO2: 96% 99% 98% 96%  Weight:      Height:       Weight change:   Intake/Output  Summary (Last 24 hours) at 06/13/2020 1219 Last data filed at 06/13/2020 0011 Gross per 24 hour  Intake 711.25 ml  Output --  Net 711.25 ml       Labs: Basic Metabolic Panel: Recent Labs  Lab 06/12/20 1009 06/12/20 1744 06/13/20 0500  NA 130* 132* 132*  K 6.6* 5.4* 4.7  CL 100 99 99  CO2 20* 24 24  GLUCOSE 186* 121* 115*  BUN 42* 45* 48*  CREATININE 2.57* 2.49* 2.37*  CALCIUM 8.1* 8.1* 7.8*   Liver Function Tests: Recent Labs  Lab 06/12/20 0755 06/12/20 1009  AST 15 18  ALT 9 12  ALKPHOS 66 64  BILITOT 0.3 0.4  PROT 6.7 7.2  ALBUMIN 3.3* 3.7   No results for input(s): LIPASE, AMYLASE in the last 168 hours. No results for input(s): AMMONIA in the last 168 hours. CBC: Recent Labs  Lab 06/12/20 0755 06/12/20 1009 06/13/20 0500  WBC 4.1 4.4 3.5*  NEUTROABS 3.0 2.9  --   HGB 5.8* 6.3* 6.8*  HCT 18.8* 21.4* 22.5*  MCV 90.8 94.7 92.2  PLT <5* <5* <5*   Cardiac Enzymes: No results for input(s): CKTOTAL, CKMB, CKMBINDEX, TROPONINI in the last 168 hours. CBG: Recent Labs  Lab 06/12/20 1234 06/12/20 1703 06/12/20 2308 06/13/20 0743  GLUCAP 108* 117* 154* 102*    Iron Studies:  Recent Labs    06/12/20 1350  IRON 14*  TIBC 324  FERRITIN 43   Studies/Results: US RENAL  Result Date: 06/12/2020 CLINICAL DATA:  Acute kidney injury EXAM: RENAL / URINARY TRACT ULTRASOUND COMPLETE COMPARISON:  CT abdomen and pelvis June 26, 2019 FINDINGS: Right Kidney: Renal measurements: 9.7 x 4.2 x 5.2 cm = volume: 111.7 mL. Echogenicity and renal cortical thickness are within normal limits. No mass, perinephric fluid, or hydronephrosis visualized. No sonographically demonstrable calculus or ureterectasis. Left Kidney: Renal measurements: 10.8 x 5.5 x 5.1 cm = volume: 157.9 mL. Echogenicity and renal cortical thickness are within normal limits. No mass, perinephric fluid, or hydronephrosis visualized. There is a prominent column of Bertin, an anatomic variant. No  sonographically demonstrable calculus or ureterectasis Bladder: Appears normal for degree of bladder distention. Other: None. IMPRESSION: Study within normal limits. Electronically Signed   By: Bretta Bang III M.D.   On: 06/12/2020 16:14   DG Chest Portable 1 View  Result Date: 06/12/2020 CLINICAL DATA:  palpitations EXAM: PORTABLE CHEST 1 VIEW COMPARISON:  Chest radiograph 03/09/2020 FINDINGS: Unchanged, enlarged cardiac silhouette. There is no focal airspace consolidation. There is no large pleural effusion or visible pneumothorax. No acute osseous abnormality. IMPRESSION: Unchanged cardiomegaly.  No focal airspace disease. Electronically Signed   By: Caprice Renshaw   On: 06/12/2020 10:33    Medications: Infusions:  sodium chloride Stopped (06/12/20 1200)   IMMUNE GLOBULIN 10% (HUMAN) IV - For Fluid Restriction Only     iron sucrose      Scheduled Medications:  sodium chloride   Intravenous Once   acetaminophen  650 mg Oral Q24 Hr x 2   And   diphenhydrAMINE  25 mg Oral Q24 Hr x 2   ferrous gluconate  324 mg Oral Q breakfast   furosemide  40 mg Intravenous BID   gabapentin  100 mg Oral BID   insulin aspart  0-9 Units Subcutaneous TID WC   rosuvastatin  20 mg Oral QHS   sodium bicarbonate  1,300 mg Oral BID    have reviewed scheduled and prn medications.  Physical Exam: General:NAD, comfortable Heart:RRR, s1s2 nl Lungs:clear b/l, no crackle Abdomen:soft, Non-tender, non-distended Extremities:No LE edema, right BKA Neurology: Alert, awake, nonfocal  [name] 06/13/2020,12:19 PM  LOS: 1 day

## 2020-06-13 NOTE — Progress Notes (Signed)
PROGRESS NOTE    [name]  HQI:[id number] DOB: 1965-03-25 DOA: 06/12/2020 PCP: Patient, No Pcp Per (Inactive)   Brief Narrative:  This 55 years old female with PMH significant for ITP on IVIG, hypertension, chronic diastolic CHF, CKD stage III, type 2 diabetes was sent from oncologist office for the evaluation of pancytopenia and worsening of kidney functions. Patient also reports having recurrent nosebleeds which has been stopped lately.  She is found to have a hemoglobin 5.8, creatinine 2.5 as compared to the baseline 1.41 last month and platelet count less than 5. Patient has received a unit of PRBC and platelets.  She is also found to have acute kidney injury on CKD. Nephrology and hematology is consulted.  Hematology recommended IVIG followed by platelet transfusion if she has active bleeding.  Assessment & Plan:   Active Problems:   Severe anemia   Symptomatic anemia   AKI (acute kidney injury) (HCC)  Severe thrombocytopenia: Patient presented with platelet count of less than 5. Patient has received 1 unit of platelets transfusion. Hematologist consulted, recommended IVIG 2 treatments. No need for platelet transfusion until she has active bleeding. Patient's long-term plan is a splenectomy once platelet count improved.  Acute on chronic iron deficiency anemia. Likely secondary to severe thrombocytopenia. She is found to have a hemoglobin of 5.8. S/p 1 PRBC hemoglobin improved to 6.8. Transfuse 1 PRBC to keep hemoglobin above 7.0  AKI on CKD stage II: Baseline serum creatinine 1.4.  Patient presented with serum creatinine 2.5. Continue IV hydration.  Nephrology is following.  Acute on chronic diastolic CHF.: Patient does shows signs of fluid overload , probably multifactorial from worsening anemia. Continue Lasix 40 IV twice daily.  Monitor daily weight and intake and output charting.   Hypertension:  hold blood pressure medications , blood pressure on the lower  side.   Diabetes mellitus:  discontinue metformin. Regular insulin sliding scale.  Diabetic neuropathy: Reduce the dose of gabapentin.  DVT prophylaxis: SCDs Code Status: Full code Family Communication: No family at bed side. Disposition Plan:   Status is: Inpatient  Remains inpatient appropriate because:Inpatient level of care appropriate due to severity of illness  Dispo: The patient is from: Home              Anticipated d/c is to: Home              Patient currently is not medically stable to d/c.   Difficult to place patient No   Consultants:  Hematology Nephrology  Procedures:   Antimicrobials:   Anti-infectives (From admission, onward)    None        Subjective: Patient was seen and examined at bedside.  Overnight events noted.  She denies any chest pain.  She reports feeling tired but denies any nosebleed , vaginal bleeding.  Otherwise she is doing much better.  Objective: Vitals:   06/13/20 0210 06/13/20 0522 06/13/20 1333 06/13/20 1407  BP: (!) 110/52 (!) 120/57 (!) 136/54 (!) 141/64  Pulse: 67 66 79 80  Resp:   20 16  Temp: 98.7 F (37.1 C) 97.9 F (36.6 C) 98.5 F (36.9 C) 97.9 F (36.6 C)  TempSrc: Oral Oral Oral Oral  SpO2: 98% 96% 93% 93%  Weight:      Height:        Intake/Output Summary (Last 24 hours) at 06/13/2020 1415 Last data filed at 06/13/2020 1315 Gross per 24 hour  Intake 711.25 ml  Output 450 ml  Net 261.25 ml  Filed Weights   06/12/20 0909  Weight: 90.7 kg    Examination:  General exam: Appears calm and comfortable, not in any acute distress. Respiratory system: Clear to auscultation. Respiratory effort normal. Cardiovascular system: S1 & S2 heard, RRR. No JVD, murmurs, rubs, gallops or clicks. No pedal edema. Gastrointestinal system: Abdomen is nondistended, soft and nontender. No organomegaly or masses felt. Normal bowel sounds heard. Central nervous system: Alert and oriented. No focal neurological  deficits. Extremities: Pedal edema 1+, no cyanosis, no clubbing. Skin: No rashes, lesions or ulcers Psychiatry: Judgement and insight appear normal. Mood & affect appropriate.     Data Reviewed: I have personally reviewed following labs and imaging studies  CBC: Recent Labs  Lab 06/12/20 0755 06/12/20 1009 06/13/20 0500  WBC 4.1 4.4 3.5*  NEUTROABS 3.0 2.9  --   HGB 5.8* 6.3* 6.8*  HCT 18.8* 21.4* 22.5*  MCV 90.8 94.7 92.2  PLT <5* <5* <5*   Basic Metabolic Panel: Recent Labs  Lab 06/12/20 0755 06/12/20 1009 06/12/20 1744 06/13/20 0500  NA 133* 130* 132* 132*  K 6.6* 6.6* 5.4* 4.7  CL 100 100 99 99  CO2 18* 20* 24 24  GLUCOSE 215* 186* 121* 115*  BUN 41* 42* 45* 48*  CREATININE 2.63* 2.57* 2.49* 2.37*  CALCIUM 8.1* 8.1* 8.1* 7.8*   GFR: Estimated Creatinine Clearance: 31.7 mL/min (A) (by C-G formula based on SCr of 2.37 mg/dL (H)). Liver Function Tests: Recent Labs  Lab 06/12/20 0755 06/12/20 1009  AST 15 18  ALT 9 12  ALKPHOS 66 64  BILITOT 0.3 0.4  PROT 6.7 7.2  ALBUMIN 3.3* 3.7   No results for input(s): LIPASE, AMYLASE in the last 168 hours. No results for input(s): AMMONIA in the last 168 hours. Coagulation Profile: No results for input(s): INR, PROTIME in the last 168 hours. Cardiac Enzymes: No results for input(s): CKTOTAL, CKMB, CKMBINDEX, TROPONINI in the last 168 hours. BNP (last 3 results) No results for input(s): PROBNP in the last 8760 hours. HbA1C: No results for input(s): HGBA1C in the last 72 hours. CBG: Recent Labs  Lab 06/12/20 1234 06/12/20 1703 06/12/20 2308 06/13/20 0743 06/13/20 1249  GLUCAP 108* 117* 154* 102* 179*   Lipid Profile: No results for input(s): CHOL, HDL, LDLCALC, TRIG, CHOLHDL, LDLDIRECT in the last 72 hours. Thyroid Function Tests: Recent Labs    06/12/20 1744  TSH 2.758   Anemia Panel: Recent Labs    06/12/20 1350  VITAMINB12 103*  FOLATE 5.5*  FERRITIN 43  TIBC 324  IRON 14*  RETICCTPCT  4.7*   Sepsis Labs: No results for input(s): PROCALCITON, LATICACIDVEN in the last 168 hours.  Recent Results (from the past 240 hour(s))  Resp Panel by RT-PCR (Flu A&B, Covid) Nasopharyngeal Swab     Status: None   Collection Time: 06/12/20 10:27 AM   Specimen: Nasopharyngeal Swab; Nasopharyngeal(NP) swabs in vial transport medium  Result Value Ref Range Status   SARS Coronavirus 2 by RT PCR NEGATIVE NEGATIVE Final    Comment: (NOTE) SARS-CoV-2 target nucleic acids are NOT DETECTED.  The SARS-CoV-2 RNA is generally detectable in upper respiratory specimens during the acute phase of infection. The lowest concentration of SARS-CoV-2 viral copies this assay can detect is 138 copies/mL. A negative result does not preclude SARS-Cov-2 infection and should not be used as the sole basis for treatment or other patient management decisions. A negative result may occur with  improper specimen collection/handling, submission of specimen other than nasopharyngeal swab, presence  of viral mutation(s) within the areas targeted by this assay, and inadequate number of viral copies(<138 copies/mL). A negative result must be combined with clinical observations, patient history, and epidemiological information. The expected result is Negative.  Fact Sheet for Patients:  BloggerCourse.comhttps://www.fda.gov/media/152166/download  Fact Sheet for Healthcare Providers:  SeriousBroker.ithttps://www.fda.gov/media/152162/download  This test is no t yet approved or cleared by the [place] FDA and  has been authorized for detection and/or diagnosis of SARS-CoV-2 by FDA under an Emergency Use Authorization (EUA). This EUA will remain  in effect (meaning this test can be used) for the duration of the COVID-19 declaration under Section 564(b)(1) of the Act, 21 U.S.C.section 360bbb-3(b)(1), unless the authorization is terminated  or revoked sooner.       Influenza A by PCR NEGATIVE NEGATIVE Final   Influenza B by PCR NEGATIVE  NEGATIVE Final    Comment: (NOTE) The Xpert Xpress SARS-CoV-2/FLU/RSV plus assay is intended as an aid in the diagnosis of influenza from Nasopharyngeal swab specimens and should not be used as a sole basis for treatment. Nasal washings and aspirates are unacceptable for Xpert Xpress SARS-CoV-2/FLU/RSV testing.  Fact Sheet for Patients: BloggerCourse.comhttps://www.fda.gov/media/152166/download  Fact Sheet for Healthcare Providers: SeriousBroker.ithttps://www.fda.gov/media/152162/download  This test is not yet approved or cleared by the [place] FDA and has been authorized for detection and/or diagnosis of SARS-CoV-2 by FDA under an Emergency Use Authorization (EUA). This EUA will remain in effect (meaning this test can be used) for the duration of the COVID-19 declaration under Section 564(b)(1) of the Act, 21 U.S.C. section 360bbb-3(b)(1), unless the authorization is terminated or revoked.  Performed at [facility], [address] [place], [place] [address]      Radiology Studies: US RENAL  Result Date: 06/12/2020 CLINICAL DATA:  Acute kidney injury EXAM: RENAL / URINARY TRACT ULTRASOUND COMPLETE COMPARISON:  CT abdomen and pelvis June 26, 2019 FINDINGS: Right Kidney: Renal measurements: 9.7 x 4.2 x 5.2 cm = volume: 111.7 mL. Echogenicity and renal cortical thickness are within normal limits. No mass, perinephric fluid, or hydronephrosis visualized. No sonographically demonstrable calculus or ureterectasis. Left Kidney: Renal measurements: 10.8 x 5.5 x 5.1 cm = volume: 157.9 mL. Echogenicity and renal cortical thickness are within normal limits. No mass, perinephric fluid, or hydronephrosis visualized. There is a prominent column of Bertin, an anatomic variant. No sonographically demonstrable calculus or ureterectasis Bladder: Appears normal for degree of bladder distention. Other: None. IMPRESSION: Study within normal limits. Electronically Signed   By: Bretta BangWilliam  Woodruff III M.D.   On:  06/12/2020 16:14   DG Chest Portable 1 View  Result Date: 06/12/2020 CLINICAL DATA:  palpitations EXAM: PORTABLE CHEST 1 VIEW COMPARISON:  Chest radiograph 03/09/2020 FINDINGS: Unchanged, enlarged cardiac silhouette. There is no focal airspace consolidation. There is no large pleural effusion or visible pneumothorax. No acute osseous abnormality. IMPRESSION: Unchanged cardiomegaly.  No focal airspace disease. Electronically Signed   By: Caprice RenshawJacob  Kahn   On: 06/12/2020 10:33    Scheduled Meds:  sodium chloride   Intravenous Once   acetaminophen  650 mg Oral Q24 Hr x 2   And   diphenhydrAMINE  25 mg Oral Q24 Hr x 2   ferrous gluconate  324 mg Oral Q breakfast   gabapentin  100 mg Oral BID   insulin aspart  0-9 Units Subcutaneous TID WC   rosuvastatin  20 mg Oral QHS   sodium bicarbonate  1,300 mg Oral BID   Continuous Infusions:  sodium chloride Stopped (06/12/20 1200)  IMMUNE GLOBULIN 10% (HUMAN) IV - For Fluid Restriction Only     iron sucrose 500 mg (06/13/20 1350)     LOS: 1 day    Time spent: 35 mins    Kiev Labrosse, MD [facility]   If 7PM-7AM, please contact night-coverage

## 2020-06-13 NOTE — Progress Notes (Signed)
IV team was ordered for a PIV restart. Patient is receive a blood transfusion.

## 2020-06-13 NOTE — Evaluation (Signed)
Physical Therapy Evaluation Patient Details Name: [name] MRN: [id number] DOB: November 15, 1965 Today's Date: 06/13/2020   History of Present Illness  55 yo female presents to ED on 6/9 with low Hgb, repeated nosebleeds on 6/9. PMH includes ITP on IVIG, hypertension, chronic diastolic CHF, CKD stage III, type 2 diabetes, R BKA 2013(getting fitted for prosthesis).  Clinical Impression  Pt presents with generalized weakness, fatigue suspect secondary to anemia, impaired balance, min-mod difficulty transferring OOB. Pt to benefit from acute PT to address deficits. Pt requiring min-mod assist for stand pivot to/from [facility], per pt is normally independent with this in/out of w/c. PT recommending continuing with HHPT and receiving assist for son at d/c as needed, as he lives with her. PT to progress mobility as tolerated, and will continue to follow acutely.      Follow Up Recommendations Home health PT;Supervision for mobility/OOB    Equipment Recommendations  None recommended by PT    Recommendations for Other Services       Precautions / Restrictions Precautions Precautions: Fall Precaution Comments: R BKA, low platelets and Hgb Restrictions Weight Bearing Restrictions: No      Mobility  Bed Mobility Overal bed mobility: Needs Assistance Bed Mobility: Supine to Sit;Sit to Supine     Supine to sit: Supervision Sit to supine: Supervision   General bed mobility comments: supervision for safety    Transfers Overall transfer level: Needs assistance Equipment used: Rolling walker (2 wheeled);1 person hand held assist Transfers: Sit to/from [employer] Sit to Stand: Min assist Stand pivot transfers: Min assist;Mod assist       General transfer comment: Min assist for rise, steady, and safe pivot to/from [facility]. RW utilized first time with combination of hop and pivot on LLE, second time with HHA +1 and pt reaching for bed (more of a squat).  Ambulation/Gait                 Stairs            Wheelchair Mobility    Modified Rankin (Stroke Patients Only)       Balance Overall balance assessment: Needs assistance Sitting-balance support: No upper extremity supported;Feet supported Sitting balance-Leahy Scale: Good     Standing balance support: Bilateral upper extremity supported;During functional activity Standing balance-Leahy Scale: Poor Standing balance comment: relaint on external steadying                             Pertinent Vitals/Pain Pain Assessment: Faces Faces Pain Scale: Hurts a little bit Pain Location: head Pain Descriptors / Indicators: Headache Pain Intervention(s): Limited activity within patient's tolerance;Monitored during session;Repositioned    Home Living Family/patient expects to be discharged to:: Private residence Living Arrangements: Children (son lives with pt) Available Help at Discharge: Family;Available PRN/intermittently Type of Home: House Home Access: Stairs to enter Entrance Stairs-Rails: None Entrance Stairs-Number of Steps: 1 Home Layout: 1/2 bath on main level Home Equipment: Bedside commode;Walker - 2 wheels;Wheelchair - manual;Hospital bed      Prior Function Level of Independence: Needs assistance   Gait / Transfers Assistance Needed: pt reports using w/c more than RW, pt reports no falls in the past year  ADL's / Homemaking Assistance Needed: family helps with groceries, pt does not drive. Pt reports independence with ADLs        Hand Dominance   Dominant Hand: Right    Extremity/Trunk Assessment   Upper Extremity Assessment Upper Extremity Assessment:  Defer to OT evaluation    Lower Extremity Assessment Lower Extremity Assessment: RLE deficits/detail;Generalized weakness RLE Deficits / Details: chronic R BKA    Cervical / Trunk Assessment Cervical / Trunk Assessment: Normal  Communication   Communication: No difficulties  Cognition  Arousal/Alertness: Awake/alert Behavior During Therapy: WFL for tasks assessed/performed Overall Cognitive Status: No family/caregiver present to determine baseline cognitive functioning                                 General Comments: increased processing time, requires repeated cuing intermittently      General Comments      Exercises     Assessment/Plan    PT Assessment Patient needs continued PT services  PT Problem List Decreased strength;Decreased mobility;Decreased safety awareness;Decreased activity tolerance;Decreased balance;Decreased knowledge of use of DME;Pain;Cardiopulmonary status limiting activity       PT Treatment Interventions Gait training;Therapeutic exercise;Patient/family education;Therapeutic activities;DME instruction;Balance training;Functional mobility training;Stair training    PT Goals (Current goals can be found in the Care Plan section)  Acute Rehab PT Goals Patient Stated Goal: go home, be as independent as possible PT Goal Formulation: With patient Time For Goal Achievement: 06/27/20 Potential to Achieve Goals: Good    Frequency Min 3X/week   Barriers to discharge        Co-evaluation               AM-PAC PT "6 Clicks" Mobility  Outcome Measure Help needed turning from your back to your side while in a flat bed without using bedrails?: A Little Help needed moving from lying on your back to sitting on the side of a flat bed without using bedrails?: A Little Help needed moving to and from a bed to a chair (including a wheelchair)?: A Lot Help needed standing up from a chair using your arms (e.g., wheelchair or bedside chair)?: A Little Help needed to walk in hospital room?: A Lot Help needed climbing 3-5 steps with a railing? : A Lot 6 Click Score: 15    End of Session Equipment Utilized During Treatment: Gait belt Activity Tolerance: Patient tolerated treatment well;Patient limited by fatigue Patient left: in  bed;with call bell/phone within reach;with bed alarm set;with SCD's reapplied Nurse Communication: Mobility status (NT notified, stand pivot with RW to/from [facility]) PT Visit Diagnosis: Other abnormalities of gait and mobility (R26.89);Unsteadiness on feet (R26.81)    Time: 2585-2778 PT Time Calculation (min) (ACUTE ONLY): 31 min   Charges:   PT Evaluation $PT Eval Low Complexity: 1 Low PT Treatments $Therapeutic Activity: 8-22 mins        Marye Round, PT DPT Acute Rehabilitation Services Pager [phone]  Office [phone]   Tyrone Apple E Christain [place] 06/13/2020, 4:19 PM

## 2020-06-13 NOTE — Progress Notes (Signed)
Patient up to [facility] to void 450 ml amber urine. Bladder scanned with 362 ml residual. Pt experiencing no signs of retention.Melton Alar

## 2020-06-13 NOTE — Progress Notes (Signed)
Relapsed refractory ITP Patient response to IVIG but the responses are very short lasting. I had discussed with Dr. Derrell Lolling with surgery that if her platelet counts improved then she would benefit from splenectomy. It appears that she is not very compliant with weekly injections of Nplate.  Although I am not certain that the Nplate is actually giving her much benefit at this time. Her relapses are extremely frequent and making it difficult to manage her ITP.  Plan: 2 doses of IVIG while she is in the hospital Please do not administer platelets unless there is active/uncontrolled bleeding. If you do need to administer platelets, administer them after IVIG has been given.

## 2020-06-13 NOTE — Progress Notes (Signed)
Reported off to day shift RN to F/U with plan of care and MD responding back.

## 2020-06-14 ENCOUNTER — Other Ambulatory Visit: Payer: Self-pay | Admitting: Hematology and Oncology

## 2020-06-14 DIAGNOSIS — D693 Immune thrombocytopenic purpura: Secondary | ICD-10-CM | POA: Diagnosis not present

## 2020-06-14 DIAGNOSIS — E785 Hyperlipidemia, unspecified: Secondary | ICD-10-CM | POA: Diagnosis not present

## 2020-06-14 DIAGNOSIS — D61818 Other pancytopenia: Secondary | ICD-10-CM | POA: Diagnosis not present

## 2020-06-14 DIAGNOSIS — E1122 Type 2 diabetes mellitus with diabetic chronic kidney disease: Secondary | ICD-10-CM | POA: Diagnosis not present

## 2020-06-14 DIAGNOSIS — I5033 Acute on chronic diastolic (congestive) heart failure: Secondary | ICD-10-CM | POA: Diagnosis not present

## 2020-06-14 DIAGNOSIS — D500 Iron deficiency anemia secondary to blood loss (chronic): Secondary | ICD-10-CM

## 2020-06-14 DIAGNOSIS — N183 Chronic kidney disease, stage 3 unspecified: Secondary | ICD-10-CM

## 2020-06-14 DIAGNOSIS — E875 Hyperkalemia: Secondary | ICD-10-CM | POA: Diagnosis not present

## 2020-06-14 DIAGNOSIS — Z20822 Contact with and (suspected) exposure to covid-19: Secondary | ICD-10-CM | POA: Diagnosis not present

## 2020-06-14 DIAGNOSIS — D649 Anemia, unspecified: Secondary | ICD-10-CM | POA: Diagnosis not present

## 2020-06-14 DIAGNOSIS — I130 Hypertensive heart and chronic kidney disease with heart failure and stage 1 through stage 4 chronic kidney disease, or unspecified chronic kidney disease: Secondary | ICD-10-CM | POA: Diagnosis not present

## 2020-06-14 DIAGNOSIS — E871 Hypo-osmolality and hyponatremia: Secondary | ICD-10-CM | POA: Diagnosis not present

## 2020-06-14 DIAGNOSIS — N390 Urinary tract infection, site not specified: Secondary | ICD-10-CM | POA: Diagnosis not present

## 2020-06-14 DIAGNOSIS — R040 Epistaxis: Secondary | ICD-10-CM | POA: Diagnosis not present

## 2020-06-14 DIAGNOSIS — Z23 Encounter for immunization: Secondary | ICD-10-CM | POA: Diagnosis not present

## 2020-06-14 DIAGNOSIS — I959 Hypotension, unspecified: Secondary | ICD-10-CM | POA: Diagnosis not present

## 2020-06-14 DIAGNOSIS — N1832 Chronic kidney disease, stage 3b: Secondary | ICD-10-CM | POA: Diagnosis not present

## 2020-06-14 DIAGNOSIS — N179 Acute kidney failure, unspecified: Secondary | ICD-10-CM | POA: Diagnosis not present

## 2020-06-14 LAB — RENAL FUNCTION PANEL
Albumin: 3.4 g/dL — ABNORMAL LOW (ref 3.5–5.0)
Anion gap: 9 (ref 5–15)
BUN: 42 mg/dL — ABNORMAL HIGH (ref 6–20)
CO2: 23 mmol/L (ref 22–32)
Calcium: 8.1 mg/dL — ABNORMAL LOW (ref 8.9–10.3)
Chloride: 107 mmol/L (ref 98–111)
Creatinine, Ser: 1.53 mg/dL — ABNORMAL HIGH (ref 0.44–1.00)
GFR, Estimated: 40 mL/min — ABNORMAL LOW (ref >60)
Glucose, Bld: 122 mg/dL — ABNORMAL HIGH (ref 70–99)
Phosphorus: 4.7 mg/dL — ABNORMAL HIGH (ref 2.5–4.6)
Potassium: 4.1 mmol/L (ref 3.5–5.1)
Sodium: 139 mmol/L (ref 135–145)

## 2020-06-14 LAB — TYPE AND SCREEN
ABO/RH(D): B POS
Antibody Screen: NEGATIVE
Unit division: 0
Unit division: 0

## 2020-06-14 LAB — GLUCOSE, CAPILLARY
Glucose-Capillary: 108 mg/dL — ABNORMAL HIGH (ref 70–99)
Glucose-Capillary: 160 mg/dL — ABNORMAL HIGH (ref 70–99)
Glucose-Capillary: 108 mg/dL — ABNORMAL HIGH (ref 70–99)
Glucose-Capillary: 132 mg/dL — ABNORMAL HIGH (ref 70–99)

## 2020-06-14 LAB — MAGNESIUM: Magnesium: 2.2 mg/dL (ref 1.7–2.4)

## 2020-06-14 LAB — CBC
HCT: 25.8 % — ABNORMAL LOW (ref 36.0–46.0)
Hemoglobin: 7.9 g/dL — ABNORMAL LOW (ref 12.0–15.0)
MCH: 28.6 pg (ref 26.0–34.0)
MCHC: 30.6 g/dL (ref 30.0–36.0)
MCV: 93.5 fL (ref 80.0–100.0)
Platelets: <5 10*3/uL — CL (ref 150–400)
RBC: 2.76 MIL/uL — ABNORMAL LOW (ref 3.87–5.11)
RDW: 17.3 % — ABNORMAL HIGH (ref 11.5–15.5)
WBC: 3.2 10*3/uL — ABNORMAL LOW (ref 4.0–10.5)
nRBC: 0 % (ref 0.0–0.2)

## 2020-06-14 LAB — PHOSPHORUS: Phosphorus: 4.8 mg/dL — ABNORMAL HIGH (ref 2.5–4.6)

## 2020-06-14 LAB — BPAM RBC
Blood Product Expiration Date: 202207022359
Blood Product Expiration Date: 202207022359
ISSUE DATE / TIME: 202206092031
ISSUE DATE / TIME: 202206102336
Unit Type and Rh: 7300
Unit Type and Rh: 7300

## 2020-06-14 LAB — BASIC METABOLIC PANEL
Anion gap: 9 (ref 5–15)
BUN: 43 mg/dL — ABNORMAL HIGH (ref 6–20)
CO2: 23 mmol/L (ref 22–32)
Calcium: 8.1 mg/dL — ABNORMAL LOW (ref 8.9–10.3)
Chloride: 107 mmol/L (ref 98–111)
Creatinine, Ser: 1.6 mg/dL — ABNORMAL HIGH (ref 0.44–1.00)
GFR, Estimated: 38 mL/min — ABNORMAL LOW (ref >60)
Glucose, Bld: 123 mg/dL — ABNORMAL HIGH (ref 70–99)
Potassium: 4.1 mmol/L (ref 3.5–5.1)
Sodium: 139 mmol/L (ref 135–145)

## 2020-06-14 MED ORDER — ISOSORBIDE MONONITRATE ER 30 MG PO TB24
30.0000 mg | ORAL_TABLET | Freq: Every day | ORAL | Status: DC
  Administered 2020-06-14 – 2020-06-15 (×2): 30 mg via ORAL
  Filled 2020-06-14 (×2): qty 1

## 2020-06-14 MED ORDER — DIPHENHYDRAMINE HCL 25 MG PO CAPS
25.0000 mg | ORAL_CAPSULE | Freq: Once | ORAL | Status: AC
  Administered 2020-06-15: 25 mg via ORAL
  Filled 2020-06-14: qty 1

## 2020-06-14 MED ORDER — METOPROLOL SUCCINATE ER 50 MG PO TB24
50.0000 mg | ORAL_TABLET | Freq: Every day | ORAL | Status: DC
  Administered 2020-06-14 – 2020-06-15 (×2): 50 mg via ORAL
  Filled 2020-06-14 (×3): qty 1

## 2020-06-14 MED ORDER — ACETAMINOPHEN 325 MG PO TABS
650.0000 mg | ORAL_TABLET | Freq: Once | ORAL | Status: AC
  Administered 2020-06-15: 650 mg via ORAL
  Filled 2020-06-14: qty 2

## 2020-06-14 MED ORDER — SODIUM CHLORIDE 0.9 % IV SOLN
1.0000 g | INTRAVENOUS | Status: DC
  Administered 2020-06-14 – 2020-06-15 (×2): 1 g via INTRAVENOUS
  Filled 2020-06-14 (×2): qty 1

## 2020-06-14 MED ORDER — AMLODIPINE BESYLATE 5 MG PO TABS
5.0000 mg | ORAL_TABLET | Freq: Every day | ORAL | Status: DC
  Administered 2020-06-14 – 2020-06-15 (×2): 5 mg via ORAL
  Filled 2020-06-14 (×2): qty 1

## 2020-06-14 NOTE — Progress Notes (Signed)
[facility] NEPHROLOGY PROGRESS NOTE  Assessment/ Plan: Pt is a 55 y.o. yo female  HTN HLD DM neuropathy, PAD,  HFpEF, CKD3 with a history of ITP and receives IVIG at the [facility], sent to ER for abnormal lab including platelet less than 5 and hemoglobin 5.8.  We are consulted for AKI on CKD.  Problems: Acute kidney injury on CKD 3 (b/l cr 1.4-1.5) - reportedly saw Dr. Signe Colt back in 2017 and then lost to follow-up.  The AKI likely hemodynamically mediated in the setting of severe anemia + possibly osmotic diuresis related with IVIG.  Kidney ultrasound unremarkable. UA w/ wbc's only, no protein. Complements are negative. Other serologies pending. Pt was not felt to vol overloaded and IV lasix was dc'd and IVF"s given. Peak creat was 2.49 on admission yesterday, down to 1.53 today which is her baseline. Clinically stable. We will arrange for f/u in the office in 1-2 weeks. Would hold off on po lasix at dc. No further suggestions, will sign off.    Hyperkalemia: Improved after medical management. Thrombocytopenia with history of ITP:  IVIG/platelet infusion etc as per hematology/primary care.  She had epistaxis recently. Anemia: Transfused PRBC, IV iron.  Management per hematologist. Hypertension: Blood pressure acceptable.  On hydralazine.  Vinson Moselle, MD 06/14/2020, 9:03 PM      Subjective: Seen and examined.  Creat down to 1.53 from 2.37 yesterday.    Objective Vital signs in last 24 hours: Vitals:   06/14/20 0538 06/14/20 1122 06/14/20 1346 06/14/20 2031  BP: 137/65 (!) 179/77 (!) 164/68 (!) 120/52  Pulse: 78 91 88 79  Resp: 17 20 18 20   Temp: 98.4 F (36.9 C) 98 F (36.7 C) 98.5 F (36.9 C) 98.8 F (37.1 C)  TempSrc:  Oral Oral Oral  SpO2: 95% (!) 88% 92% 93%  Weight:      Height:       Weight change:   Intake/Output Summary (Last 24 hours) at 06/14/2020 2103 Last data filed at 06/14/2020 1955 Gross per 24 hour  Intake 1630.65 ml  Output --  Net  1630.65 ml        Labs: Basic Metabolic Panel: Recent Labs  Lab 06/12/20 1744 06/13/20 0500 06/14/20 0532  NA 132* 132* 139  139  K 5.4* 4.7 4.1  4.1  CL 99 99 107  107  CO2 24 24 23  23   GLUCOSE 121* 115* 123*  122*  BUN 45* 48* 43*  42*  CREATININE 2.49* 2.37* 1.60*  1.53*  CALCIUM 8.1* 7.8* 8.1*  8.1*  PHOS  --   --  4.8*  4.7*    Liver Function Tests: Recent Labs  Lab 06/12/20 0755 06/12/20 1009 06/14/20 0532  AST 15 18  --   ALT 9 12  --   ALKPHOS 66 64  --   BILITOT 0.3 0.4  --   PROT 6.7 7.2  --   ALBUMIN 3.3* 3.7 3.4*    No results for input(s): LIPASE, AMYLASE in the last 168 hours. No results for input(s): AMMONIA in the last 168 hours. CBC: Recent Labs  Lab 06/12/20 0755 06/12/20 0755 06/12/20 1009 06/13/20 0500 06/13/20 1506 06/14/20 0532  WBC 4.1  --  4.4 3.5*  --  3.2*  NEUTROABS 3.0  --  2.9  --   --   --   HGB 5.8*   < > 6.3* 6.8* 7.8* 7.9*  HCT 18.8*  --  21.4* 22.5* 25.0* 25.8*  MCV 90.8  --  94.7 92.2  --  93.5  PLT <5*  --  <5* <5*  --  <5*   < > = values in this interval not displayed.    Cardiac Enzymes: No results for input(s): CKTOTAL, CKMB, CKMBINDEX, TROPONINI in the last 168 hours. CBG: Recent Labs  Lab 06/13/20 2047 06/14/20 0749 06/14/20 1204 06/14/20 1714 06/14/20 2057  GLUCAP 133* 108* 160* 108* 132*     Iron Studies:  Recent Labs    06/12/20 1350  IRON 14*  TIBC 324  FERRITIN 43    Studies/Results: ECHOCARDIOGRAM COMPLETE  Result Date: 06/13/2020    ECHOCARDIOGRAM REPORT   Patient Name:   Earvin HansenMICHELLE L Asbridge Date of Exam: 06/13/2020 Medical Rec #:  [id number]         Height:       67.0 in Accession #:    [id number]        Weight:       200.0 lb Date of Birth:  06/11/1965         BSA:          2.022 m Patient Age:    54 years          BP:           120/57 mmHg Patient Gender: F                 HR:           80 bpm. Exam Location:  Inpatient Procedure: 2D Echo, Cardiac Doppler and Color Doppler  Indications:    I50.33 Acute on chronic diastolic (congestive) heart failure  History:        Patient has prior history of Echocardiogram examinations, most                 recent 04/11/2019. Stroke; Risk Factors:Diabetes and Hypertension.  Sonographer:    Elmarie Shileyiffany Dance Referring Phys: [id number] Emeline GeneralPING T ZHANG IMPRESSIONS  1. Left ventricular ejection fraction, by estimation, is 60 to 65%. The left ventricle has normal function. The left ventricle has no regional wall motion abnormalities. There is mild concentric left ventricular hypertrophy. Left ventricular diastolic parameters are indeterminate. Elevated left ventricular end-diastolic pressure.  2. Right ventricular systolic function is normal. The right ventricular size is mildly enlarged.  3. Right atrial size was moderately dilated.  4. The mitral valve is normal in structure. Mild mitral valve regurgitation. No evidence of mitral stenosis.  5. The aortic valve is normal in structure. Aortic valve regurgitation is not visualized. No aortic stenosis is present.  6. The inferior vena cava is normal in size with greater than 50% respiratory variability, suggesting right atrial pressure of 3 mmHg. FINDINGS  Left Ventricle: Left ventricular ejection fraction, by estimation, is 60 to 65%. The left ventricle has normal function. The left ventricle has no regional wall motion abnormalities. The left ventricular internal cavity size was normal in size. There is  mild concentric left ventricular hypertrophy. Left ventricular diastolic parameters are indeterminate. Elevated left ventricular end-diastolic pressure. Right Ventricle: The right ventricular size is mildly enlarged. No increase in right ventricular wall thickness. Right ventricular systolic function is normal. Left Atrium: Left atrial size was normal in size. Right Atrium: Right atrial size was moderately dilated. Pericardium: There is no evidence of pericardial effusion. Mitral Valve: The mitral valve is normal in  structure. Mild mitral valve regurgitation. No evidence of mitral valve stenosis. Tricuspid Valve: The tricuspid valve is normal in structure. Tricuspid valve regurgitation is mild . No evidence  of tricuspid stenosis. Aortic Valve: The aortic valve is normal in structure. Aortic valve regurgitation is not visualized. No aortic stenosis is present. Pulmonic Valve: The pulmonic valve was normal in structure. Pulmonic valve regurgitation is not visualized. No evidence of pulmonic stenosis. Aorta: The aortic root is normal in size and structure. Venous: The inferior vena cava was not well visualized. The inferior vena cava is normal in size with greater than 50% respiratory variability, suggesting right atrial pressure of 3 mmHg. IAS/Shunts: No atrial level shunt detected by color flow Doppler.  LEFT VENTRICLE PLAX 2D LVIDd:         5.10 cm  Diastology LVIDs:         2.80 cm  LV e' medial:    6.09 cm/s LV PW:         1.27 cm  LV E/e' medial:  21.3 LV IVS:        1.10 cm  LV e' lateral:   9.90 cm/s LVOT diam:     1.80 cm  LV E/e' lateral: 13.1 LV SV:         47 LV SV Index:   23 LVOT Area:     2.54 cm  RIGHT VENTRICLE             IVC RV Basal diam:  4.10 cm     IVC diam: 2.20 cm RV Mid diam:    2.70 cm RV S prime:     12.00 cm/s TAPSE (M-mode): 2.3 cm LEFT ATRIUM             Index       RIGHT ATRIUM           Index LA diam:        4.90 cm 2.42 cm/m  RA Area:     29.10 cm LA Vol (A2C):   68.7 ml 33.97 ml/m RA Volume:   98.60 ml  48.76 ml/m LA Vol (A4C):   57.7 ml 28.53 ml/m LA Biplane Vol: 65.3 ml 32.29 ml/m  AORTIC VALVE LVOT Vmax:   95.95 cm/s LVOT Vmean:  62.700 cm/s LVOT VTI:    0.186 m  AORTA Ao Root diam: 3.20 cm Ao Asc diam:  2.70 cm MITRAL VALVE                TRICUSPID VALVE MV Area (PHT): 3.60 cm     TR Peak grad:   29.2 mmHg MV Decel Time: 211 msec     TR Vmax:        270.00 cm/s MV E velocity: 130.00 cm/s MV A velocity: 102.00 cm/s  SHUNTS MV E/A ratio:  1.27         Systemic VTI:  0.19 m                              Systemic Diam: 1.80 cm Armanda Magic MD Electronically signed by Armanda Magic MD Signature Date/Time: 06/13/2020/3:20:37 PM    Final     Medications: Infusions:  sodium chloride Stopped (06/12/20 1200)   cefTRIAXone (ROCEPHIN)  IV 1 g (06/14/20 1314)   IMMUNE GLOBULIN 10% (HUMAN) IV - For Fluid Restriction Only 90 g (06/14/20 0440)    Scheduled Medications:  sodium chloride   Intravenous Once   [START ON 06/15/2020] acetaminophen  650 mg Oral Once   And   [START ON 06/15/2020] diphenhydrAMINE  25 mg Oral Once   amLODipine  5 mg Oral Daily  ferrous gluconate  324 mg Oral Q breakfast   gabapentin  100 mg Oral BID   insulin aspart  0-9 Units Subcutaneous TID WC   isosorbide mononitrate  30 mg Oral Daily   metoprolol succinate  50 mg Oral Daily   rosuvastatin  20 mg Oral QHS   sodium bicarbonate  1,300 mg Oral BID    have reviewed scheduled and prn medications.  Physical Exam: General:NAD, comfortable Heart:RRR, s1s2 nl Lungs:clear b/l, no crackle Abdomen:soft, Non-tender, non-distended Extremities:No LE edema, right BKA Neurology: Alert, awake, nonfocal  Barbette Hair Glendora Clouatre 06/14/2020,9:03 PM  LOS: 2 days

## 2020-06-14 NOTE — Progress Notes (Addendum)
PROGRESS NOTE    [name]  HBZ:[id number] DOB: 02-10-1965 DOA: 06/12/2020 PCP: Patient, No Pcp Per (Inactive)   Brief Narrative:  This 55 years old female with PMH significant for ITP on IVIG, hypertension, chronic diastolic CHF, CKD stage III, type 2 diabetes was sent from oncologist office for the evaluation of pancytopenia and worsening of kidney functions. Patient also reports having recurrent nosebleeds which has been stopped lately.  She is found to have a hemoglobin 5.8, creatinine 2.5 as compared to the baseline 1.41 last month and platelet count less than 5. Patient has received a unit of PRBC and platelets.  She is also found to have acute kidney injury on CKD. Nephrology and hematology is consulted.  Hematology recommended IVIG followed by platelet transfusion if she has active bleeding.  Assessment & Plan:   Active Problems:   Severe anemia   Symptomatic anemia   AKI (acute kidney injury) (HCC)  Severe thrombocytopenia: Patient presented with platelet count of less than 5. Patient has received 1 unit of platelets transfusion in ED. Hematologist consulted, recommended IVIG 2 treatments. No need for platelet transfusion unless she has active bleeding. Patient's long-term plan is  splenectomy once platelet count improved. Make sure platelet count  be > 20000 before discharge  Acute on chronic iron deficiency anemia. Likely secondary to severe thrombocytopenia. She is found to have a hemoglobin of 5.8. S/p 2 PRBC hemoglobin improved to 6.8 > 7.9 She is getting IV iron therapy  AKI on CKD stage III: > Improving. Baseline serum creatinine 1.4.  Patient presented with serum creatinine 2.5. Continue IV hydration.  Nephrology is following. Creatinine improved to 1.60  Acute on chronic diastolic CHF.: Patient does shows signs of fluid overload , probably multifactorial from worsening anemia. Continue Lasix 40 IV twice daily.  Monitor daily weight and intake and output  charting.  UTI: She reports burning urination, UA:  Nitrite+ Continue ceftriaxone x 3 days. Follow up Urine culture.  Hypertension: Resume home BP medications.  Diabetes mellitus:  discontinue metformin. Regular insulin sliding scale.  Diabetic neuropathy: Reduce the dose of gabapentin.  DVT prophylaxis: SCDs Code Status: Full code Family Communication: No family at bed side. Disposition Plan:   Status is: Inpatient  Remains inpatient appropriate because:Inpatient level of care appropriate due to severity of illness  Dispo: The patient is from: Home              Anticipated d/c is to: Home              Patient currently is not medically stable to d/c.   Difficult to place patient No   Consultants:  Hematology Nephrology  Procedures:   Antimicrobials:   Anti-infectives (From admission, onward)    Start     Dose/Rate Route Frequency Ordered Stop   06/14/20 1000  cefTRIAXone (ROCEPHIN) 1 g in sodium chloride 0.9 % 100 mL IVPB        1 g 200 mL/hr over 30 Minutes Intravenous Every 24 hours 06/14/20 0826          Subjective: Patient was seen and examined at bedside.  Overnight events noted.  She denies any chest pain.   She reports feeling tired but denies any nosebleed , vaginal bleeding.   Objective: Vitals:   06/14/20 0501 06/14/20 0521 06/14/20 0538 06/14/20 1122  BP: (!) 151/60 (!) 147/67 137/65 (!) 179/77  Pulse: 79 82 78 91  Resp: 18 18 17 20   Temp: 98.6 F (37 C) 98.3 F (  36.8 C) 98.4 F (36.9 C) 98 F (36.7 C)  TempSrc:    Oral  SpO2: 90% 99% 95% (!) 88%  Weight:      Height:        Intake/Output Summary (Last 24 hours) at 06/14/2020 1313 Last data filed at 06/14/2020 0600 Gross per 24 hour  Intake 2751 ml  Output 700 ml  Net 2051 ml   Filed Weights   06/12/20 0909  Weight: 90.7 kg    Examination:  General exam: Appears calm and comfortable, not in any acute distress. Respiratory system: Clear to auscultation. Respiratory effort  normal. Cardiovascular system: S1 & S2 heard, RRR. No JVD, murmurs, rubs, gallops or clicks. No pedal edema. Gastrointestinal system: Abdomen is nondistended, soft and nontender. No organomegaly or masses felt. Normal bowel sounds heard. Central nervous system: Alert and oriented x 3. No focal neurological deficits. Extremities: Pedal edema 1+, no cyanosis, no clubbing. Skin: No rashes, lesions or ulcers Psychiatry: Judgement and insight appear normal. Mood & affect appropriate.     Data Reviewed: I have personally reviewed following labs and imaging studies  CBC: Recent Labs  Lab 06/12/20 0755 06/12/20 1009 06/13/20 0500 06/13/20 1506 06/14/20 0532  WBC 4.1 4.4 3.5*  --  3.2*  NEUTROABS 3.0 2.9  --   --   --   HGB 5.8* 6.3* 6.8* 7.8* 7.9*  HCT 18.8* 21.4* 22.5* 25.0* 25.8*  MCV 90.8 94.7 92.2  --  93.5  PLT <5* <5* <5*  --  <5*   Basic Metabolic Panel: Recent Labs  Lab 06/12/20 0755 06/12/20 1009 06/12/20 1744 06/13/20 0500 06/14/20 0532  NA 133* 130* 132* 132* 139  139  K 6.6* 6.6* 5.4* 4.7 4.1  4.1  CL 100 100 99 99 107  107  CO2 18* 20* 24 24 23  23   GLUCOSE 215* 186* 121* 115* 123*  122*  BUN 41* 42* 45* 48* 43*  42*  CREATININE 2.63* 2.57* 2.49* 2.37* 1.60*  1.53*  CALCIUM 8.1* 8.1* 8.1* 7.8* 8.1*  8.1*  MG  --   --   --   --  2.2  PHOS  --   --   --   --  4.8*  4.7*   GFR: Estimated Creatinine Clearance: 49.1 mL/min (A) (by C-G formula based on SCr of 1.53 mg/dL (H)). Liver Function Tests: Recent Labs  Lab 06/12/20 0755 06/12/20 1009 06/14/20 0532  AST 15 18  --   ALT 9 12  --   ALKPHOS 66 64  --   BILITOT 0.3 0.4  --   PROT 6.7 7.2  --   ALBUMIN 3.3* 3.7 3.4*   No results for input(s): LIPASE, AMYLASE in the last 168 hours. No results for input(s): AMMONIA in the last 168 hours. Coagulation Profile: No results for input(s): INR, PROTIME in the last 168 hours. Cardiac Enzymes: No results for input(s): CKTOTAL, CKMB, CKMBINDEX,  TROPONINI in the last 168 hours. BNP (last 3 results) No results for input(s): PROBNP in the last 8760 hours. HbA1C: No results for input(s): HGBA1C in the last 72 hours. CBG: Recent Labs  Lab 06/13/20 1249 06/13/20 1752 06/13/20 2047 06/14/20 0749 06/14/20 1204  GLUCAP 179* 146* 133* 108* 160*   Lipid Profile: No results for input(s): CHOL, HDL, LDLCALC, TRIG, CHOLHDL, LDLDIRECT in the last 72 hours. Thyroid Function Tests: Recent Labs    06/12/20 1744  TSH 2.758   Anemia Panel: Recent Labs    06/12/20 1350  VITAMINB12 103*  FOLATE 5.5*  FERRITIN 43  TIBC 324  IRON 14*  RETICCTPCT 4.7*   Sepsis Labs: No results for input(s): PROCALCITON, LATICACIDVEN in the last 168 hours.  Recent Results (from the past 240 hour(s))  Resp Panel by RT-PCR (Flu A&B, Covid) Nasopharyngeal Swab     Status: None   Collection Time: 06/12/20 10:27 AM   Specimen: Nasopharyngeal Swab; Nasopharyngeal(NP) swabs in vial transport medium  Result Value Ref Range Status   SARS Coronavirus 2 by RT PCR NEGATIVE NEGATIVE Final    Comment: (NOTE) SARS-CoV-2 target nucleic acids are NOT DETECTED.  The SARS-CoV-2 RNA is generally detectable in upper respiratory specimens during the acute phase of infection. The lowest concentration of SARS-CoV-2 viral copies this assay can detect is 138 copies/mL. A negative result does not preclude SARS-Cov-2 infection and should not be used as the sole basis for treatment or other patient management decisions. A negative result may occur with  improper specimen collection/handling, submission of specimen other than nasopharyngeal swab, presence of viral mutation(s) within the areas targeted by this assay, and inadequate number of viral copies(<138 copies/mL). A negative result must be combined with clinical observations, patient history, and epidemiological information. The expected result is Negative.  Fact Sheet for Patients:   BloggerCourse.com  Fact Sheet for Healthcare Providers:  SeriousBroker.it  This test is no t yet approved or cleared by the [place] FDA and  has been authorized for detection and/or diagnosis of SARS-CoV-2 by FDA under an Emergency Use Authorization (EUA). This EUA will remain  in effect (meaning this test can be used) for the duration of the COVID-19 declaration under Section 564(b)(1) of the Act, 21 U.S.C.section 360bbb-3(b)(1), unless the authorization is terminated  or revoked sooner.       Influenza A by PCR NEGATIVE NEGATIVE Final   Influenza B by PCR NEGATIVE NEGATIVE Final    Comment: (NOTE) The Xpert Xpress SARS-CoV-2/FLU/RSV plus assay is intended as an aid in the diagnosis of influenza from Nasopharyngeal swab specimens and should not be used as a sole basis for treatment. Nasal washings and aspirates are unacceptable for Xpert Xpress SARS-CoV-2/FLU/RSV testing.  Fact Sheet for Patients: BloggerCourse.com  Fact Sheet for Healthcare Providers: SeriousBroker.it  This test is not yet approved or cleared by the [place] FDA and has been authorized for detection and/or diagnosis of SARS-CoV-2 by FDA under an Emergency Use Authorization (EUA). This EUA will remain in effect (meaning this test can be used) for the duration of the COVID-19 declaration under Section 564(b)(1) of the Act, 21 U.S.C. section 360bbb-3(b)(1), unless the authorization is terminated or revoked.  Performed at [facility], [address], [place], [place] [address]      Radiology Studies: US RENAL  Result Date: 06/12/2020 CLINICAL DATA:  Acute kidney injury EXAM: RENAL / URINARY TRACT ULTRASOUND COMPLETE COMPARISON:  CT abdomen and pelvis June 26, 2019 FINDINGS: Right Kidney: Renal measurements: 9.7 x 4.2 x 5.2 cm = volume: 111.7 mL. Echogenicity and renal cortical  thickness are within normal limits. No mass, perinephric fluid, or hydronephrosis visualized. No sonographically demonstrable calculus or ureterectasis. Left Kidney: Renal measurements: 10.8 x 5.5 x 5.1 cm = volume: 157.9 mL. Echogenicity and renal cortical thickness are within normal limits. No mass, perinephric fluid, or hydronephrosis visualized. There is a prominent column of Bertin, an anatomic variant. No sonographically demonstrable calculus or ureterectasis Bladder: Appears normal for degree of bladder distention. Other: None. IMPRESSION: Study within normal limits. Electronically Signed   By:  Bretta Bang III M.D.   On: 06/12/2020 16:14   ECHOCARDIOGRAM COMPLETE  Result Date: 06/13/2020    ECHOCARDIOGRAM REPORT   Patient Name:   [name] Date of Exam: 06/13/2020 Medical Rec #:  [id number]         Height:       67.0 in Accession #:    [id number]        Weight:       200.0 lb Date of Birth:  05-17-1965         BSA:          2.022 m Patient Age:    54 years          BP:           120/57 mmHg Patient Gender: F                 HR:           80 bpm. Exam Location:  Inpatient Procedure: 2D Echo, Cardiac Doppler and Color Doppler Indications:    I50.33 Acute on chronic diastolic (congestive) heart failure  History:        Patient has prior history of Echocardiogram examinations, most                 recent 04/11/2019. Stroke; Risk Factors:Diabetes and Hypertension.  Sonographer:    Elmarie Shiley Dance Referring Phys: [id number] Emeline General IMPRESSIONS  1. Left ventricular ejection fraction, by estimation, is 60 to 65%. The left ventricle has normal function. The left ventricle has no regional wall motion abnormalities. There is mild concentric left ventricular hypertrophy. Left ventricular diastolic parameters are indeterminate. Elevated left ventricular end-diastolic pressure.  2. Right ventricular systolic function is normal. The right ventricular size is mildly enlarged.  3. Right atrial size was  moderately dilated.  4. The mitral valve is normal in structure. Mild mitral valve regurgitation. No evidence of mitral stenosis.  5. The aortic valve is normal in structure. Aortic valve regurgitation is not visualized. No aortic stenosis is present.  6. The inferior vena cava is normal in size with greater than 50% respiratory variability, suggesting right atrial pressure of 3 mmHg. FINDINGS  Left Ventricle: Left ventricular ejection fraction, by estimation, is 60 to 65%. The left ventricle has normal function. The left ventricle has no regional wall motion abnormalities. The left ventricular internal cavity size was normal in size. There is  mild concentric left ventricular hypertrophy. Left ventricular diastolic parameters are indeterminate. Elevated left ventricular end-diastolic pressure. Right Ventricle: The right ventricular size is mildly enlarged. No increase in right ventricular wall thickness. Right ventricular systolic function is normal. Left Atrium: Left atrial size was normal in size. Right Atrium: Right atrial size was moderately dilated. Pericardium: There is no evidence of pericardial effusion. Mitral Valve: The mitral valve is normal in structure. Mild mitral valve regurgitation. No evidence of mitral valve stenosis. Tricuspid Valve: The tricuspid valve is normal in structure. Tricuspid valve regurgitation is mild . No evidence of tricuspid stenosis. Aortic Valve: The aortic valve is normal in structure. Aortic valve regurgitation is not visualized. No aortic stenosis is present. Pulmonic Valve: The pulmonic valve was normal in structure. Pulmonic valve regurgitation is not visualized. No evidence of pulmonic stenosis. Aorta: The aortic root is normal in size and structure. Venous: The inferior vena cava was not well visualized. The inferior vena cava is normal in size with greater than 50% respiratory variability, suggesting right atrial pressure of 3 mmHg. IAS/Shunts: No  atrial level shunt  detected by color flow Doppler.  LEFT VENTRICLE PLAX 2D LVIDd:         5.10 cm  Diastology LVIDs:         2.80 cm  LV e' medial:    6.09 cm/s LV PW:         1.27 cm  LV E/e' medial:  21.3 LV IVS:        1.10 cm  LV e' lateral:   9.90 cm/s LVOT diam:     1.80 cm  LV E/e' lateral: 13.1 LV SV:         47 LV SV Index:   23 LVOT Area:     2.54 cm  RIGHT VENTRICLE             IVC RV Basal diam:  4.10 cm     IVC diam: 2.20 cm RV Mid diam:    2.70 cm RV S prime:     12.00 cm/s TAPSE (M-mode): 2.3 cm LEFT ATRIUM             Index       RIGHT ATRIUM           Index LA diam:        4.90 cm 2.42 cm/m  RA Area:     29.10 cm LA Vol (A2C):   68.7 ml 33.97 ml/m RA Volume:   98.60 ml  48.76 ml/m LA Vol (A4C):   57.7 ml 28.53 ml/m LA Biplane Vol: 65.3 ml 32.29 ml/m  AORTIC VALVE LVOT Vmax:   95.95 cm/s LVOT Vmean:  62.700 cm/s LVOT VTI:    0.186 m  AORTA Ao Root diam: 3.20 cm Ao Asc diam:  2.70 cm MITRAL VALVE                TRICUSPID VALVE MV Area (PHT): 3.60 cm     TR Peak grad:   29.2 mmHg MV Decel Time: 211 msec     TR Vmax:        270.00 cm/s MV E velocity: 130.00 cm/s MV A velocity: 102.00 cm/s  SHUNTS MV E/A ratio:  1.27         Systemic VTI:  0.19 m                             Systemic Diam: 1.80 cm Armanda Magic MD Electronically signed by Armanda Magic MD Signature Date/Time: 06/13/2020/3:20:37 PM    Final     Scheduled Meds:  sodium chloride   Intravenous Once   acetaminophen  650 mg Oral Q24 Hr x 2   And   diphenhydrAMINE  25 mg Oral Q24 Hr x 2   ferrous gluconate  324 mg Oral Q breakfast   gabapentin  100 mg Oral BID   insulin aspart  0-9 Units Subcutaneous TID WC   rosuvastatin  20 mg Oral QHS   sodium bicarbonate  1,300 mg Oral BID   Continuous Infusions:  sodium chloride Stopped (06/12/20 1200)   cefTRIAXone (ROCEPHIN)  IV     IMMUNE GLOBULIN 10% (HUMAN) IV - For Fluid Restriction Only 90 g (06/14/20 0440)     LOS: 2 days    Time spent: 25 mins    Sunjai Levandoski, MD [facility]   If 7PM-7AM, please contact night-coverage

## 2020-06-14 NOTE — Progress Notes (Signed)
[name]   DOB:1965/03/02   HW#:[id number]    ASSESSMENT & PLAN:  Severe ITP I have reviewed documentation from Dr. Pamelia Wolf She is receiving IVIG today So far, she has no signs of bleeding Per recommendation from Dr. Pamelia Wolf, we will hold off platelet transfusion unless she has clinical signs of bleeding  Chronic kidney disease stage Wolf Clinically stable Observe closely  Severe anemia due to bleeding Does not need transfusion support today Monitor closely She is receiving IV iron  Goals of care Stable platelet count at least greater than 20,000 consistently before discharge  Discharge planning She will likely be here for the next few days  All questions were answered. The patient knows to call the clinic with any problems, questions or concerns.   The total time spent in the appointment was 15 minutes encounter with patients including review of chart and various tests results, discussions about plan of care and coordination of care plan  Cheryl Delay, MD 06/14/2020 10:34 AM  Subjective:  She is seen this morning to follow-up on severe ITP She denies bleeding for the last 24 hours  Objective:  Vitals:   06/14/20 0521 06/14/20 0538  BP: (!) 147/67 137/65  Pulse: 82 78  Resp: 18 17  Temp: 98.3 F (36.8 C) 98.4 F (36.9 C)  SpO2: 99% 95%     Intake/Output Summary (Last 24 hours) at 06/14/2020 1034 Last data filed at 06/14/2020 0600 Gross per 24 hour  Intake 2751 ml  Output 700 ml  Net 2051 ml    GENERAL:alert, no distress and comfortable NEURO: alert & oriented x 3 with fluent speech, no focal motor/sensory deficits   Labs:  Recent Labs    08/08/19 0256 09/05/19 1401 09/19/19 1449 03/08/20 1109 06/06/20 1052 06/12/20 0755 06/12/20 1009 06/12/20 1744 06/13/20 0500 06/14/20 0532  NA 137 136 139   < > 138 133* 130* 132* 132* 139  139  K 3.4* 5.3* 5.3*   < > 4.6 6.6* 6.6* 5.4* 4.7 4.1  4.1  CL 108 106 107   < > 104 100 100 99 99 107  107  CO2 24  20* 24   < > 23 18* 20* 24 24 23  23   GLUCOSE 117* 137* 103*   < > 157* 215* 186* 121* 115* 123*  122*  BUN 14 29* 32*   < > 49* 41* 42* 45* 48* 43*  42*  CREATININE 0.88 1.05* 1.22*   < > 1.42* 2.63* 2.57* 2.49* 2.37* 1.60*  1.53*  CALCIUM 8.3* 9.6 9.0   < > 8.8* 8.1* 8.1* 8.1* 7.8* 8.1*  8.1*  GFRNONAA >60 >60 51*   < > 44* 21* 22* 23* 24* 38*  40*  GFRAA >60 >60 59*  --   --   --   --   --   --   --   PROT  --  7.9 7.7   < > 7.0 6.7 7.2  --   --   --   ALBUMIN  --  3.6 3.6   < > 3.3* 3.3* 3.7  --   --  3.4*  AST  --  17 19   < > 19 15 18   --   --   --   ALT  --  10 17   < > 8 9 12   --   --   --   ALKPHOS  --  87 89   < > 66 66 64  --   --   --  BILITOT  --  0.3 0.3   < > 0.4 0.3 0.4  --   --   --    < > = values in this interval not displayed.    Studies:  US RENAL  Result Date: 06/12/2020 CLINICAL DATA:  Acute kidney injury EXAM: RENAL / URINARY TRACT ULTRASOUND COMPLETE COMPARISON:  CT abdomen and pelvis June 26, 2019 FINDINGS: Right Kidney: Renal measurements: 9.7 x 4.2 x 5.2 cm = volume: 111.7 mL. Echogenicity and renal cortical thickness are within normal limits. No mass, perinephric fluid, or hydronephrosis visualized. No sonographically demonstrable calculus or ureterectasis. Left Kidney: Renal measurements: 10.8 x 5.5 x 5.1 cm = volume: 157.9 mL. Echogenicity and renal cortical thickness are within normal limits. No mass, perinephric fluid, or hydronephrosis visualized. There is a prominent column of Bertin, an anatomic variant. No sonographically demonstrable calculus or ureterectasis Bladder: Appears normal for degree of bladder distention. Other: None. IMPRESSION: Study within normal limits. Electronically Signed   By: Cheryl BangWilliam  Woodruff Wolf M.D.   On: 06/12/2020 16:14   DG Chest Portable 1 View  Result Date: 06/12/2020 CLINICAL DATA:  palpitations EXAM: PORTABLE CHEST 1 VIEW COMPARISON:  Chest radiograph 03/09/2020 FINDINGS: Unchanged, enlarged cardiac silhouette. There is  no focal airspace consolidation. There is no large pleural effusion or visible pneumothorax. No acute osseous abnormality. IMPRESSION: Unchanged cardiomegaly.  No focal airspace disease. Electronically Signed   By: [name]   On: 06/12/2020 10:33   ECHOCARDIOGRAM COMPLETE  Result Date: 06/13/2020    ECHOCARDIOGRAM REPORT   Patient Name:   [name] Date of Exam: 06/13/2020 Medical Rec #:  [id number]         Height:       67.0 in Accession #:    [id number]        Weight:       200.0 lb Date of Birth:  12/14/1965         BSA:          2.022 m Patient Age:    54 years          BP:           120/57 mmHg Patient Gender: F                 HR:           80 bpm. Exam Location:  Inpatient Procedure: 2D Echo, Cardiac Doppler and Color Doppler Indications:    I50.33 Acute on chronic diastolic (congestive) heart failure  History:        Patient has prior history of Echocardiogram examinations, most                 recent 04/11/2019. Stroke; Risk Factors:Diabetes and Hypertension.  Sonographer:    Cheryl Shileyiffany Wolf Referring Phys: [id number] [name] IMPRESSIONS  1. Left ventricular ejection fraction, by estimation, is 60 to 65%. The left ventricle has normal function. The left ventricle has no regional wall motion abnormalities. There is mild concentric left ventricular hypertrophy. Left ventricular diastolic parameters are indeterminate. Elevated left ventricular end-diastolic pressure.  2. Right ventricular systolic function is normal. The right ventricular size is mildly enlarged.  3. Right atrial size was moderately dilated.  4. The mitral valve is normal in structure. Mild mitral valve regurgitation. No evidence of mitral stenosis.  5. The aortic valve is normal in structure. Aortic valve regurgitation is not visualized. No aortic stenosis is present.  6. The inferior vena cava is normal in size  with greater than 50% respiratory variability, suggesting right atrial pressure of 3 mmHg. FINDINGS  Left Ventricle: Left  ventricular ejection fraction, by estimation, is 60 to 65%. The left ventricle has normal function. The left ventricle has no regional wall motion abnormalities. The left ventricular internal cavity size was normal in size. There is  mild concentric left ventricular hypertrophy. Left ventricular diastolic parameters are indeterminate. Elevated left ventricular end-diastolic pressure. Right Ventricle: The right ventricular size is mildly enlarged. No increase in right ventricular wall thickness. Right ventricular systolic function is normal. Left Atrium: Left atrial size was normal in size. Right Atrium: Right atrial size was moderately dilated. Pericardium: There is no evidence of pericardial effusion. Mitral Valve: The mitral valve is normal in structure. Mild mitral valve regurgitation. No evidence of mitral valve stenosis. Tricuspid Valve: The tricuspid valve is normal in structure. Tricuspid valve regurgitation is mild . No evidence of tricuspid stenosis. Aortic Valve: The aortic valve is normal in structure. Aortic valve regurgitation is not visualized. No aortic stenosis is present. Pulmonic Valve: The pulmonic valve was normal in structure. Pulmonic valve regurgitation is not visualized. No evidence of pulmonic stenosis. Aorta: The aortic root is normal in size and structure. Venous: The inferior vena cava was not well visualized. The inferior vena cava is normal in size with greater than 50% respiratory variability, suggesting right atrial pressure of 3 mmHg. IAS/Shunts: No atrial level shunt detected by color flow Doppler.  LEFT VENTRICLE PLAX 2D LVIDd:         5.10 cm  Diastology LVIDs:         2.80 cm  LV e' medial:    6.09 cm/s LV PW:         1.27 cm  LV E/e' medial:  21.3 LV IVS:        1.10 cm  LV e' lateral:   9.90 cm/s LVOT diam:     1.80 cm  LV E/e' lateral: 13.1 LV SV:         47 LV SV Index:   23 LVOT Area:     2.54 cm  RIGHT VENTRICLE             IVC RV Basal diam:  4.10 cm     IVC diam: 2.20 cm  RV Mid diam:    2.70 cm RV S prime:     12.00 cm/s TAPSE (M-mode): 2.3 cm LEFT ATRIUM             Index       RIGHT ATRIUM           Index LA diam:        4.90 cm 2.42 cm/m  RA Area:     29.10 cm LA Vol (A2C):   68.7 ml 33.97 ml/m RA Volume:   98.60 ml  48.76 ml/m LA Vol (A4C):   57.7 ml 28.53 ml/m LA Biplane Vol: 65.3 ml 32.29 ml/m  AORTIC VALVE LVOT Vmax:   95.95 cm/s LVOT Vmean:  62.700 cm/s LVOT VTI:    0.186 m  AORTA Ao Root diam: 3.20 cm Ao Asc diam:  2.70 cm MITRAL VALVE                TRICUSPID VALVE MV Area (PHT): 3.60 cm     TR Peak grad:   29.2 mmHg MV Decel Time: 211 msec     TR Vmax:        270.00 cm/s MV E velocity: 130.00 cm/s MV A velocity:  102.00 cm/s  SHUNTS MV E/A ratio:  1.27         Systemic VTI:  0.19 m                             Systemic Diam: 1.80 cm Armanda Magic MD Electronically signed by Armanda Magic MD Signature Date/Time: 06/13/2020/3:20:37 PM    Final

## 2020-06-14 NOTE — Progress Notes (Signed)
Patient accidentally pulled out PIV access in the morning and stop IVIG infusion by primary care nurse. New access put in on RFA. Restarted IVIG. Educated patient if she has sign of symptoms of allergy reaction, notify to RN. Patient understood it well. Talked to pharmacist regarding reschedule of 2nd dose of IVIG. Move to 06/15/20 6am. Informed pt's nurse this matter. HS [employer]

## 2020-06-15 DIAGNOSIS — E785 Hyperlipidemia, unspecified: Secondary | ICD-10-CM | POA: Diagnosis not present

## 2020-06-15 DIAGNOSIS — Z20822 Contact with and (suspected) exposure to covid-19: Secondary | ICD-10-CM | POA: Diagnosis not present

## 2020-06-15 DIAGNOSIS — N179 Acute kidney failure, unspecified: Secondary | ICD-10-CM | POA: Diagnosis not present

## 2020-06-15 DIAGNOSIS — D693 Immune thrombocytopenic purpura: Secondary | ICD-10-CM | POA: Diagnosis not present

## 2020-06-15 DIAGNOSIS — N1832 Chronic kidney disease, stage 3b: Secondary | ICD-10-CM | POA: Diagnosis not present

## 2020-06-15 DIAGNOSIS — N183 Chronic kidney disease, stage 3 unspecified: Secondary | ICD-10-CM | POA: Diagnosis not present

## 2020-06-15 DIAGNOSIS — N390 Urinary tract infection, site not specified: Secondary | ICD-10-CM | POA: Diagnosis not present

## 2020-06-15 DIAGNOSIS — Z23 Encounter for immunization: Secondary | ICD-10-CM | POA: Diagnosis not present

## 2020-06-15 DIAGNOSIS — E871 Hypo-osmolality and hyponatremia: Secondary | ICD-10-CM | POA: Diagnosis not present

## 2020-06-15 DIAGNOSIS — D649 Anemia, unspecified: Secondary | ICD-10-CM | POA: Diagnosis not present

## 2020-06-15 DIAGNOSIS — D61818 Other pancytopenia: Secondary | ICD-10-CM | POA: Diagnosis not present

## 2020-06-15 DIAGNOSIS — E875 Hyperkalemia: Secondary | ICD-10-CM | POA: Diagnosis not present

## 2020-06-15 DIAGNOSIS — I5033 Acute on chronic diastolic (congestive) heart failure: Secondary | ICD-10-CM | POA: Diagnosis not present

## 2020-06-15 DIAGNOSIS — E1122 Type 2 diabetes mellitus with diabetic chronic kidney disease: Secondary | ICD-10-CM | POA: Diagnosis not present

## 2020-06-15 DIAGNOSIS — R040 Epistaxis: Secondary | ICD-10-CM | POA: Diagnosis not present

## 2020-06-15 DIAGNOSIS — D500 Iron deficiency anemia secondary to blood loss (chronic): Secondary | ICD-10-CM | POA: Diagnosis not present

## 2020-06-15 DIAGNOSIS — I959 Hypotension, unspecified: Secondary | ICD-10-CM | POA: Diagnosis not present

## 2020-06-15 DIAGNOSIS — I130 Hypertensive heart and chronic kidney disease with heart failure and stage 1 through stage 4 chronic kidney disease, or unspecified chronic kidney disease: Secondary | ICD-10-CM | POA: Diagnosis not present

## 2020-06-15 LAB — BASIC METABOLIC PANEL
Anion gap: 6 (ref 5–15)
BUN: 41 mg/dL — ABNORMAL HIGH (ref 6–20)
CO2: 23 mmol/L (ref 22–32)
Calcium: 8.2 mg/dL — ABNORMAL LOW (ref 8.9–10.3)
Chloride: 108 mmol/L (ref 98–111)
Creatinine, Ser: 1.51 mg/dL — ABNORMAL HIGH (ref 0.44–1.00)
GFR, Estimated: 41 mL/min — ABNORMAL LOW (ref >60)
Glucose, Bld: 102 mg/dL — ABNORMAL HIGH (ref 70–99)
Potassium: 4.3 mmol/L (ref 3.5–5.1)
Sodium: 137 mmol/L (ref 135–145)

## 2020-06-15 LAB — CBC
HCT: 25.5 % — ABNORMAL LOW (ref 36.0–46.0)
Hemoglobin: 7.7 g/dL — ABNORMAL LOW (ref 12.0–15.0)
MCH: 28.5 pg (ref 26.0–34.0)
MCHC: 30.2 g/dL (ref 30.0–36.0)
MCV: 94.4 fL (ref 80.0–100.0)
Platelets: 40 10*3/uL — ABNORMAL LOW (ref 150–400)
RBC: 2.7 MIL/uL — ABNORMAL LOW (ref 3.87–5.11)
RDW: 17.6 % — ABNORMAL HIGH (ref 11.5–15.5)
WBC: 3.2 10*3/uL — ABNORMAL LOW (ref 4.0–10.5)
nRBC: 0 % (ref 0.0–0.2)

## 2020-06-15 LAB — GLUCOSE, CAPILLARY
Glucose-Capillary: 106 mg/dL — ABNORMAL HIGH (ref 70–99)
Glucose-Capillary: 149 mg/dL — ABNORMAL HIGH (ref 70–99)

## 2020-06-15 MED ORDER — CEPHALEXIN 500 MG PO CAPS: 500.0000 mg | ORAL_CAPSULE | Freq: Two times a day (BID) | ORAL | 0 refills | Status: AC

## 2020-06-15 MED ORDER — COVID-19 MRNA VAC-TRIS(PFIZER) 30 MCG/0.3ML IM SUSP
0.3000 mL | Freq: Once | INTRAMUSCULAR | Status: AC
  Administered 2020-06-15: 0.3 mL via INTRAMUSCULAR
  Filled 2020-06-15: qty 0.3

## 2020-06-15 MED ORDER — FERROUS GLUCONATE 324 (38 FE) MG PO TABS: 324.0000 mg | ORAL_TABLET | Freq: Every day | ORAL | 3 refills | Status: DC

## 2020-06-15 MED ORDER — CEPHALEXIN 500 MG PO CAPS: 500.0000 mg | ORAL_CAPSULE | Freq: Two times a day (BID) | ORAL | 0 refills | Status: DC

## 2020-06-15 NOTE — Discharge Summary (Addendum)
Physician Discharge Summary  [name] DOB: November 13, 1965 DOA: 06/12/2020  PCP: Patient, No Pcp Per (Inactive)  Admit date: 06/12/2020  Discharge date: 06/15/2020  Admitted From: Home.  Disposition:  HOME WITH HOME SERVICES.  Recommendations for Outpatient Follow-up:  Follow up with PCP in 1-2 weeks. Please obtain BMP/CBC in one week. Advised to follow-up with Dr. Payton Mccallum as scheduled. Advised to take Keflex 500 mg twice daily for 5 more days to complete treatment for UTI. Patient has been discharged home on home oxygen 2 L/min.  Home Health: Home PT Equipment/Devices:Home Oxygen  Discharge Condition: Good CODE STATUS:Full code Diet recommendation: Heart Healthy   Brief Summary: This 55 years old female with PMH significant for ITP on IVIG, hypertension, chronic diastolic CHF, CKD stage IIIa, type 2 diabetes was sent from oncologist office for the evaluation of pancytopenia and worsening of kidney functions. Patient also reports having recurrent nosebleeds which has stopped lately. She is found to have  hemoglobin 5.8, creatinine 2.5 as compared to the baseline of 1.41 last month and platelet count less than 5.  Patient was admitted for severe thrombocytopenia, anemia and AKI.  Patient has received 2 units of PRBCs and platelet transfusion in the ER.  She was also started on gentle IV hydration. Nephrology and hematology was consulted. Hb has improved to 7.9.  Patient has also received IV iron therapy.  Hematology recommended IVIG followed by platelet transfusion if she has active bleeding.  Hematology recommended no platelet transfusion unless she is actively bleeding.  Patient has received 2 units of IVIG, and her platelet count has improved to 40 K.  Patient's nose bleeding has stopped.  She has developed Acute on chronic hypoxia.  She reported using oxygen during nights intermittently.  Patient remained on 2 L of supplemental oxygen to keep saturation above 94%.  Renal  functions has improved. Hemoglobin has improved. Platelet count has improved.  Nephrology signed off.  Renal functions back to baseline.  Hematology has recommended outpatient follow-up.  Patient feels better and she wants to be discharged. Home health services and oxygen has been arranged.  Patient is being discharged home.  She was managed for below problems during hospitalization.  Discharge Diagnoses:  Active Problems:   Severe anemia   Symptomatic anemia   AKI (acute kidney injury) ([place])  Severe thrombocytopenia: Patient presented with platelet count of less than 5. Patient has received 1 unit of platelets transfusion in ED. Hematologist consulted, recommended IVIG 2 treatments. No need for platelet transfusion unless she has active bleeding. Patient's long-term plan is  splenectomy once platelet count improved. Make sure platelet count  be > 20000 before discharge. Platelet count improved to 40K at discharge.   Acute on chronic iron deficiency anemia. Likely secondary to severe thrombocytopenia. She is found to have a hemoglobin of 5.8. S/p 2 PRBC hemoglobin improved to 6.8 > 7.9 She is getting IV iron therapy   AKI on CKD stage III: > Improving. Baseline serum creatinine 1.4.  Patient presented with serum creatinine 2.5. Continue IV hydration.  Nephrology is following. Creatinine improved to 1.60 Nephrology signed off.   Acute on chronic diastolic CHF.: Patient does shows signs of fluid overload , probably multifactorial from worsening anemia. Continue Lasix 40 IV twice daily.  Monitor daily weight and intake and output charting.   UTI: She reports burning urination, UA:  Nitrite+ Continue ceftriaxone x 3 days. Keflex twice a day for 5 more days.  Hypertension: Resume home BP medications.   Diabetes  mellitus:  discontinue metformin. Regular insulin sliding scale.   Diabetic neuropathy: Reduce the dose of gabapentin.  Discharge Instructions  Discharge  Instructions     Call MD for:  difficulty breathing, headache or visual disturbances   Complete by: As directed    Call MD for:  persistant dizziness or light-headedness   Complete by: As directed    Call MD for:  persistant nausea and vomiting   Complete by: As directed    Diet - low sodium heart healthy   Complete by: As directed    Diet Carb Modified   Complete by: As directed    Discharge instructions   Complete by: As directed    Advised to follow-up with primary care physician in 1 week. Advised to follow-up with Dr. Payton Mccallum as scheduled. Advised to take Keflex 500 mg twice daily for 5 more days to complete treatment for UTI Patient has been discharged home on home oxygen 2 L/min.   Increase activity slowly   Complete by: As directed       Allergies as of 06/15/2020   No Known Allergies      Medication List     STOP taking these medications    metFORMIN 500 MG tablet Commonly known as: Glucophage       TAKE these medications    amLODipine 5 MG tablet Commonly known as: NORVASC Take 1 tablet (5 mg total) by mouth daily.   aspirin EC 81 MG tablet Take 81 mg by mouth daily as needed for moderate pain. Swallow whole.   cephALEXin 500 MG capsule Commonly known as: KEFLEX Take 1 capsule (500 mg total) by mouth 2 (two) times daily for 5 days.   ferrous gluconate 324 MG tablet Commonly known as: FERGON Take 1 tablet (324 mg total) by mouth daily with breakfast. Start taking on: June 16, 2020   furosemide 40 MG tablet Commonly known as: LASIX Take 160 mg by mouth daily.   gabapentin 400 MG capsule Commonly known as: NEURONTIN Take 1 capsule (400 mg total) by mouth 2 (two) times daily.   isosorbide mononitrate 30 MG 24 hr tablet Commonly known as: IMDUR Take 1 tablet (30 mg total) by mouth daily.   metoprolol succinate 50 MG 24 hr tablet Commonly known as: TOPROL-XL Take 1 tablet (50 mg total) by mouth daily. Take with or immediately following a  meal.   polyvinyl alcohol 1.4 % ophthalmic solution Commonly known as: LIQUIFILM TEARS Place 1 drop into both eyes as needed for dry eyes.   Prodigy Autocode Blood Glucose w/Device Kit 1 kit by Does not apply route in the morning, at noon, in the evening, and at bedtime.   rosuvastatin 20 MG tablet Commonly known as: CRESTOR Take 1 tablet (20 mg total) by mouth daily.               Durable Medical Equipment  (From admission, onward)           Start     Ordered   06/15/20 1242  DME Oxygen  Once       Question Answer Comment  Length of Need Lifetime   Mode or (Route) Nasal cannula   Liters per Minute 2   Frequency Continuous (stationary and portable oxygen unit needed)   Oxygen conserving device Yes   Oxygen delivery system Gas      06/15/20 1242            Follow-up Information     Nicholas Lose, MD Follow  up in 1 week(s).   Specialty: Hematology and Oncology Contact information: [place] [phone]                No Known Allergies  Consultations: Hematology    Procedures/Studies: US RENAL  Result Date: 06/12/2020 CLINICAL DATA:  Acute kidney injury EXAM: RENAL / URINARY TRACT ULTRASOUND COMPLETE COMPARISON:  CT abdomen and pelvis June 26, 2019 FINDINGS: Right Kidney: Renal measurements: 9.7 x 4.2 x 5.2 cm = volume: 111.7 mL. Echogenicity and renal cortical thickness are within normal limits. No mass, perinephric fluid, or hydronephrosis visualized. No sonographically demonstrable calculus or ureterectasis. Left Kidney: Renal measurements: 10.8 x 5.5 x 5.1 cm = volume: 157.9 mL. Echogenicity and renal cortical thickness are within normal limits. No mass, perinephric fluid, or hydronephrosis visualized. There is a prominent column of Bertin, an anatomic variant. No sonographically demonstrable calculus or ureterectasis Bladder: Appears normal for degree of bladder distention. Other: None. IMPRESSION:  Study within normal limits. Electronically Signed   By: Lowella Grip III M.D.   On: 06/12/2020 16:14   DG Chest Portable 1 View  Result Date: 06/12/2020 CLINICAL DATA:  palpitations EXAM: PORTABLE CHEST 1 VIEW COMPARISON:  Chest radiograph 03/09/2020 FINDINGS: Unchanged, enlarged cardiac silhouette. There is no focal airspace consolidation. There is no large pleural effusion or visible pneumothorax. No acute osseous abnormality. IMPRESSION: Unchanged cardiomegaly.  No focal airspace disease. Electronically Signed   By: Maurine Simmering   On: 06/12/2020 10:33   ECHOCARDIOGRAM COMPLETE  Result Date: 06/13/2020    ECHOCARDIOGRAM REPORT   Patient Name:   [name] Date of Exam: 06/13/2020 Medical Rec #:  [id number]         Height:       67.0 in Accession #:    [id number]        Weight:       200.0 lb Date of Birth:  [birth year]         BSA:          2.022 m Patient Age:    42 years          BP:           120/57 mmHg Patient Gender: F                 HR:           80 bpm. Exam Location:  Inpatient Procedure: 2D Echo, Cardiac Doppler and Color Doppler Indications:    I50.33 Acute on chronic diastolic (congestive) heart failure  History:        Patient has prior history of Echocardiogram examinations, most                 recent 04/11/2019. Stroke; Risk Factors:Diabetes and Hypertension.  Sonographer:    Jonelle Sidle Dance Referring Phys: [id number] Bryce  1. Left ventricular ejection fraction, by estimation, is 60 to 65%. The left ventricle has normal function. The left ventricle has no regional wall motion abnormalities. There is mild concentric left ventricular hypertrophy. Left ventricular diastolic parameters are indeterminate. Elevated left ventricular end-diastolic pressure.  2. Right ventricular systolic function is normal. The right ventricular size is mildly enlarged.  3. Right atrial size was moderately dilated.  4. The mitral valve is normal in structure. Mild mitral valve regurgitation. No  evidence of mitral stenosis.  5. The aortic valve is normal in structure. Aortic valve regurgitation is not visualized. No aortic stenosis is present.  6.  The inferior vena cava is normal in size with greater than 50% respiratory variability, suggesting right atrial pressure of 3 mmHg. FINDINGS  Left Ventricle: Left ventricular ejection fraction, by estimation, is 60 to 65%. The left ventricle has normal function. The left ventricle has no regional wall motion abnormalities. The left ventricular internal cavity size was normal in size. There is  mild concentric left ventricular hypertrophy. Left ventricular diastolic parameters are indeterminate. Elevated left ventricular end-diastolic pressure. Right Ventricle: The right ventricular size is mildly enlarged. No increase in right ventricular wall thickness. Right ventricular systolic function is normal. Left Atrium: Left atrial size was normal in size. Right Atrium: Right atrial size was moderately dilated. Pericardium: There is no evidence of pericardial effusion. Mitral Valve: The mitral valve is normal in structure. Mild mitral valve regurgitation. No evidence of mitral valve stenosis. Tricuspid Valve: The tricuspid valve is normal in structure. Tricuspid valve regurgitation is mild . No evidence of tricuspid stenosis. Aortic Valve: The aortic valve is normal in structure. Aortic valve regurgitation is not visualized. No aortic stenosis is present. Pulmonic Valve: The pulmonic valve was normal in structure. Pulmonic valve regurgitation is not visualized. No evidence of pulmonic stenosis. Aorta: The aortic root is normal in size and structure. Venous: The inferior vena cava was not well visualized. The inferior vena cava is normal in size with greater than 50% respiratory variability, suggesting right atrial pressure of 3 mmHg. IAS/Shunts: No atrial level shunt detected by color flow Doppler.  LEFT VENTRICLE PLAX 2D LVIDd:         5.10 cm  Diastology LVIDs:          2.80 cm  LV e' medial:    6.09 cm/s LV PW:         1.27 cm  LV E/e' medial:  21.3 LV IVS:        1.10 cm  LV e' lateral:   9.90 cm/s LVOT diam:     1.80 cm  LV E/e' lateral: 13.1 LV SV:         47 LV SV Index:   23 LVOT Area:     2.54 cm  RIGHT VENTRICLE             IVC RV Basal diam:  4.10 cm     IVC diam: 2.20 cm RV Mid diam:    2.70 cm RV S prime:     12.00 cm/s TAPSE (M-mode): 2.3 cm LEFT ATRIUM             Index       RIGHT ATRIUM           Index LA diam:        4.90 cm 2.42 cm/m  RA Area:     29.10 cm LA Vol (A2C):   68.7 ml 33.97 ml/m RA Volume:   98.60 ml  48.76 ml/m LA Vol (A4C):   57.7 ml 28.53 ml/m LA Biplane Vol: 65.3 ml 32.29 ml/m  AORTIC VALVE LVOT Vmax:   95.95 cm/s LVOT Vmean:  62.700 cm/s LVOT VTI:    0.186 m  AORTA Ao Root diam: 3.20 cm Ao Asc diam:  2.70 cm MITRAL VALVE                TRICUSPID VALVE MV Area (PHT): 3.60 cm     TR Peak grad:   29.2 mmHg MV Decel Time: 211 msec     TR Vmax:        270.00 cm/s MV  E velocity: 130.00 cm/s MV A velocity: 102.00 cm/s  SHUNTS MV E/A ratio:  1.27         Systemic VTI:  0.19 m                             Systemic Diam: 1.80 cm Fransico Him MD Electronically signed by Fransico Him MD Signature Date/Time: 06/13/2020/3:20:37 PM    Final        Subjective: Patient was seen and examined at bedside.  Overnight events noted.  Patient reports feeling much improved. She is requiring oxygen 2 L/min,  patient want to be discharged. Patient is cleared from hematology and nephrology to be discharged. Patient is being discharged home.  Discharge Exam: Vitals:   06/15/20 1029 06/15/20 1140  BP: 138/61 124/60  Pulse: 74 72  Resp: 19 18  Temp: 98.5 F (36.9 C) 98.6 F (37 C)  SpO2: 99% 98%   Vitals:   06/15/20 0753 06/15/20 0915 06/15/20 1029 06/15/20 1140  BP: (!) 146/60 (!) 150/72 138/61 124/60  Pulse: 72 72 74 72  Resp: '19 18 19 18  ' Temp: 98.8 F (37.1 C) 98.4 F (36.9 C) 98.5 F (36.9 C) 98.6 F (37 C)  TempSrc: Oral Oral Oral Oral   SpO2: 98% 98% 99% 98%  Weight:      Height:        General: Pt is alert, awake, not in acute distress Cardiovascular: RRR, S1/S2 +, no rubs, no gallops Respiratory: CTA bilaterally, no wheezing, no rhonchi Abdominal: Soft, NT, ND, bowel sounds + Extremities: no edema, no cyanosis, right BKA    The results of significant diagnostics from this hospitalization (including imaging, microbiology, ancillary and laboratory) are listed below for reference.     Microbiology: Recent Results (from the past 240 hour(s))  Resp Panel by RT-PCR (Flu A&B, Covid) Nasopharyngeal Swab     Status: None   Collection Time: 06/12/20 10:27 AM   Specimen: Nasopharyngeal Swab; Nasopharyngeal(NP) swabs in vial transport medium  Result Value Ref Range Status   SARS Coronavirus 2 by RT PCR NEGATIVE NEGATIVE Final    Comment: (NOTE) SARS-CoV-2 target nucleic acids are NOT DETECTED.  The SARS-CoV-2 RNA is generally detectable in upper respiratory specimens during the acute phase of infection. The lowest concentration of SARS-CoV-2 viral copies this assay can detect is 138 copies/mL. A negative result does not preclude SARS-Cov-2 infection and should not be used as the sole basis for treatment or other patient management decisions. A negative result may occur with  improper specimen collection/handling, submission of specimen other than nasopharyngeal swab, presence of viral mutation(s) within the areas targeted by this assay, and inadequate number of viral copies(<138 copies/mL). A negative result must be combined with clinical observations, patient history, and epidemiological information. The expected result is Negative.  Fact Sheet for Patients:  EntrepreneurPulse.com.au  Fact Sheet for Healthcare Providers:  IncredibleEmployment.be  This test is no t yet approved or cleared by the [place] FDA and  has been authorized for detection and/or diagnosis of  SARS-CoV-2 by FDA under an Emergency Use Authorization (EUA). This EUA will remain  in effect (meaning this test can be used) for the duration of the COVID-19 declaration under Section 564(b)(1) of the Act, 21 U.S.C.section 360bbb-3(b)(1), unless the authorization is terminated  or revoked sooner.       Influenza A by PCR NEGATIVE NEGATIVE Final   Influenza B by PCR NEGATIVE NEGATIVE Final  Comment: (NOTE) The Xpert Xpress SARS-CoV-2/FLU/RSV plus assay is intended as an aid in the diagnosis of influenza from Nasopharyngeal swab specimens and should not be used as a sole basis for treatment. Nasal washings and aspirates are unacceptable for Xpert Xpress SARS-CoV-2/FLU/RSV testing.  Fact Sheet for Patients: EntrepreneurPulse.com.au  Fact Sheet for Healthcare Providers: IncredibleEmployment.be  This test is not yet approved or cleared by the [place] FDA and has been authorized for detection and/or diagnosis of SARS-CoV-2 by FDA under an Emergency Use Authorization (EUA). This EUA will remain in effect (meaning this test can be used) for the duration of the COVID-19 declaration under Section 564(b)(1) of the Act, 21 U.S.C. section 360bbb-3(b)(1), unless the authorization is terminated or revoked.  Performed at [facility], [place] [address] [place], [place] [address]      Labs: BNP (last 3 results) Recent Labs    03/08/20 1309 06/12/20 0954  BNP 824.4* 989.2*   Basic Metabolic Panel: Recent Labs  Lab 06/12/20 1009 06/12/20 1744 06/13/20 0500 06/14/20 0532 06/15/20 0526  NA 130* 132* 132* 139  139 137  K 6.6* 5.4* 4.7 4.1  4.1 4.3  CL 100 99 99 107  107 108  CO2 20* '24 24 23  23 23  ' GLUCOSE 186* 121* 115* 123*  122* 102*  BUN 42* 45* 48* 43*  42* 41*  CREATININE 2.57* 2.49* 2.37* 1.60*  1.53* 1.51*  CALCIUM 8.1* 8.1* 7.8* 8.1*  8.1* 8.2*  MG  --   --   --  2.2  --   PHOS  --   --   --  4.8*   4.7*  --    Liver Function Tests: Recent Labs  Lab 06/12/20 0755 06/12/20 1009 06/14/20 0532  AST 15 18  --   ALT 9 12  --   ALKPHOS 66 64  --   BILITOT 0.3 0.4  --   PROT 6.7 7.2  --   ALBUMIN 3.3* 3.7 3.4*   No results for input(s): LIPASE, AMYLASE in the last 168 hours. No results for input(s): AMMONIA in the last 168 hours. CBC: Recent Labs  Lab 06/12/20 0755 06/12/20 1009 06/13/20 0500 06/13/20 1506 06/14/20 0532 06/15/20 0526  WBC 4.1 4.4 3.5*  --  3.2* 3.2*  NEUTROABS 3.0 2.9  --   --   --   --   HGB 5.8* 6.3* 6.8* 7.8* 7.9* 7.7*  HCT 18.8* 21.4* 22.5* 25.0* 25.8* 25.5*  MCV 90.8 94.7 92.2  --  93.5 94.4  PLT <5* <5* <5*  --  <5* 40*   Cardiac Enzymes: No results for input(s): CKTOTAL, CKMB, CKMBINDEX, TROPONINI in the last 168 hours. BNP: Invalid input(s): POCBNP CBG: Recent Labs  Lab 06/14/20 1204 06/14/20 1714 06/14/20 2057 06/15/20 0750 06/15/20 1142  GLUCAP 160* 108* 132* 106* 149*   D-Dimer No results for input(s): DDIMER in the last 72 hours. Hgb A1c No results for input(s): HGBA1C in the last 72 hours. Lipid Profile No results for input(s): CHOL, HDL, LDLCALC, TRIG, CHOLHDL, LDLDIRECT in the last 72 hours. Thyroid function studies Recent Labs    06/12/20 1744  TSH 2.758   Anemia work up Recent Labs    06/12/20 1350  VITAMINB12 103*  FOLATE 5.5*  FERRITIN 43  TIBC 324  IRON 14*  RETICCTPCT 4.7*   Urinalysis    Component Value Date/Time   COLORURINE YELLOW 06/13/2020 1318   APPEARANCEUR HAZY (A) 06/13/2020 1318   LABSPEC 1.006 06/13/2020 1318  PHURINE 5.0 06/13/2020 1318   GLUCOSEU NEGATIVE 06/13/2020 1318   HGBUR SMALL (A) 06/13/2020 1318   BILIRUBINUR NEGATIVE 06/13/2020 1318   KETONESUR NEGATIVE 06/13/2020 1318   PROTEINUR NEGATIVE 06/13/2020 1318   UROBILINOGEN 2.0 (H) 07/08/2011 0051   NITRITE NEGATIVE 06/13/2020 1318   LEUKOCYTESUR LARGE (A) 06/13/2020 1318   Sepsis Labs Invalid input(s): PROCALCITONIN,  WBC,   LACTICIDVEN Microbiology Recent Results (from the past 240 hour(s))  Resp Panel by RT-PCR (Flu A&B, Covid) Nasopharyngeal Swab     Status: None   Collection Time: 06/12/20 10:27 AM   Specimen: Nasopharyngeal Swab; Nasopharyngeal(NP) swabs in vial transport medium  Result Value Ref Range Status   SARS Coronavirus 2 by RT PCR NEGATIVE NEGATIVE Final    Comment: (NOTE) SARS-CoV-2 target nucleic acids are NOT DETECTED.  The SARS-CoV-2 RNA is generally detectable in upper respiratory specimens during the acute phase of infection. The lowest concentration of SARS-CoV-2 viral copies this assay can detect is 138 copies/mL. A negative result does not preclude SARS-Cov-2 infection and should not be used as the sole basis for treatment or other patient management decisions. A negative result may occur with  improper specimen collection/handling, submission of specimen other than nasopharyngeal swab, presence of viral mutation(s) within the areas targeted by this assay, and inadequate number of viral copies(<138 copies/mL). A negative result must be combined with clinical observations, patient history, and epidemiological information. The expected result is Negative.  Fact Sheet for Patients:  EntrepreneurPulse.com.au  Fact Sheet for Healthcare Providers:  IncredibleEmployment.be  This test is no t yet approved or cleared by the [place] FDA and  has been authorized for detection and/or diagnosis of SARS-CoV-2 by FDA under an Emergency Use Authorization (EUA). This EUA will remain  in effect (meaning this test can be used) for the duration of the COVID-19 declaration under Section 564(b)(1) of the Act, 21 U.S.C.section 360bbb-3(b)(1), unless the authorization is terminated  or revoked sooner.       Influenza A by PCR NEGATIVE NEGATIVE Final   Influenza B by PCR NEGATIVE NEGATIVE Final    Comment: (NOTE) The Xpert Xpress SARS-CoV-2/FLU/RSV plus  assay is intended as an aid in the diagnosis of influenza from Nasopharyngeal swab specimens and should not be used as a sole basis for treatment. Nasal washings and aspirates are unacceptable for Xpert Xpress SARS-CoV-2/FLU/RSV testing.  Fact Sheet for Patients: EntrepreneurPulse.com.au  Fact Sheet for Healthcare Providers: IncredibleEmployment.be  This test is not yet approved or cleared by the [place] FDA and has been authorized for detection and/or diagnosis of SARS-CoV-2 by FDA under an Emergency Use Authorization (EUA). This EUA will remain in effect (meaning this test can be used) for the duration of the COVID-19 declaration under Section 564(b)(1) of the Act, 21 U.S.C. section 360bbb-3(b)(1), unless the authorization is terminated or revoked.  Performed at [facility], [place] [address] [place], [place] [address]      Time coordinating discharge: Over 30 minutes  SIGNED:   Shawna Clamp, MD  [facility] 06/15/2020, 1:10 PM Pager   If 7PM-7AM, please contact night-coverage [email or web address]

## 2020-06-15 NOTE — Progress Notes (Signed)
Patient states she will not be going home with oxygen because of being asked to make a payment over the phone and she was not comfortable with doing so, MD made aware

## 2020-06-15 NOTE — Progress Notes (Signed)
SATURATION QUALIFICATIONS: (This note is used to comply with regulatory documentation for home oxygen)  Patient Saturations on Room Air at Rest = 83%  Patient Saturations on 2 Liters of oxygen at rest= 95%  Please briefly explain why patient needs home oxygen: to maintain O2 sats >88% Patient is wheelchair bound

## 2020-06-15 NOTE — Progress Notes (Signed)
Patient given discharge, medication, and follow up instructions, verbalized understanding, IV and telemetry monitor removed, personal belongings with patient, family to transport home  °

## 2020-06-15 NOTE — TOC Transition Note (Signed)
Transition of Care [facility]) - CM/SW Discharge Note   Patient Details  Name: [name] MRN: [id number] Date of Birth: [birth year]  Transition of Care [facility]) CM/SW Contact:  Coralyn Helling, LCSW Phone Number: 06/15/2020, 11:21 AM   Clinical Narrative:   o2 arranged with [employer].     Final next level of care: Home/Self Care Barriers to Discharge: No Barriers Identified   Patient Goals and CMS Choice     Choice offered to / list presented to : NA  Discharge Placement                       Discharge Plan and Services                DME Arranged: Oxygen DME Agency: [employer] Date DME Agency Contacted: 06/15/20 Time DME Agency Contacted: 1121 Representative spoke with at DME Agency: Jasmine HH Arranged: NA HH Agency: NA        Social Determinants of Health (SDOH) Interventions     Readmission Risk Interventions Readmission Risk Prevention Plan 03/11/2020 04/13/2019 11/13/2018  Transportation Screening Complete Complete Complete  PCP or Specialist Appt within 5-7 Days - - Complete  PCP or Specialist Appt within 3-5 Days Complete Complete -  Home Care Screening - - Complete  Medication Review (RN CM) - - Complete  HRI or Home Care Consult Complete Complete -  Social Work Consult for Recovery Care Planning/Counseling Complete Complete -  Palliative Care Screening Not Applicable Not Applicable -  Medication Review Oceanographer) Complete Complete -  Some recent data might be hidden

## 2020-06-15 NOTE — Plan of Care (Signed)

## 2020-06-15 NOTE — Progress Notes (Signed)
[name]   DOB:06-24-1965   WN#:[id number]    ASSESSMENT & PLAN:   Severe ITP, resolving I have reviewed documentation from Dr. Pamelia Hoit She is receiving day 2 of high-dose IVIG Her platelet count has improved She has no further bleeding No infusion reaction She wants to go home I will send message to Dr. Pamelia Hoit for outpatient follow-up   Chronic kidney disease stage III, improving Clinically stable Appreciate nephrology consult   Severe anemia due to bleeding Does not need transfusion support today Monitor closely She is receiving IV iron   Goals of care Goals of care achieved with improvement of platelet count   Discharge planning I think she can be discharged today I will get Dr. Pamelia Hoit to set up outpatient follow-up I will sign off  All questions were answered. The patient knows to call the clinic with any problems, questions or concerns.   The total time spent in the appointment was 15 minutes encounter with patients including review of chart and various tests results, discussions about plan of care and coordination of care plan  Artis Delay, MD 06/15/2020 9:25 AM  Subjective:  She is seen this morning She tolerated IVIG well No infusion reaction No bleeding  Objective:  Vitals:   06/15/20 0753 06/15/20 0915  BP: (!) 146/60 (!) 150/72  Pulse: 72 72  Resp: 19 18  Temp: 98.8 F (37.1 C) 98.4 F (36.9 C)  SpO2: 98% 98%     Intake/Output Summary (Last 24 hours) at 06/15/2020 0925 Last data filed at 06/15/2020 0329 Gross per 24 hour  Intake 1289.65 ml  Output --  Net 1289.65 ml    GENERAL:alert, no distress and comfortable NEURO: alert & oriented x 3 with fluent speech, no focal motor/sensory deficits   Labs:  Recent Labs    08/08/19 0256 09/05/19 1401 09/19/19 1449 03/08/20 1109 06/06/20 1052 06/12/20 0755 06/12/20 1009 06/12/20 1744 06/13/20 0500 06/14/20 0532 06/15/20 0526  NA 137 136 139   < > 138 133* 130*   < > 132* 139  139 137   K 3.4* 5.3* 5.3*   < > 4.6 6.6* 6.6*   < > 4.7 4.1  4.1 4.3  CL 108 106 107   < > 104 100 100   < > 99 107  107 108  CO2 24 20* 24   < > 23 18* 20*   < > 24 23  23 23   GLUCOSE 117* 137* 103*   < > 157* 215* 186*   < > 115* 123*  122* 102*  BUN 14 29* 32*   < > 49* 41* 42*   < > 48* 43*  42* 41*  CREATININE 0.88 1.05* 1.22*   < > 1.42* 2.63* 2.57*   < > 2.37* 1.60*  1.53* 1.51*  CALCIUM 8.3* 9.6 9.0   < > 8.8* 8.1* 8.1*   < > 7.8* 8.1*  8.1* 8.2*  GFRNONAA >60 >60 51*   < > 44* 21* 22*   < > 24* 38*  40* 41*  GFRAA >60 >60 59*  --   --   --   --   --   --   --   --   PROT  --  7.9 7.7   < > 7.0 6.7 7.2  --   --   --   --   ALBUMIN  --  3.6 3.6   < > 3.3* 3.3* 3.7  --   --  3.4*  --  AST  --  17 19   < > 19 15 18   --   --   --   --   ALT  --  10 17   < > 8 9 12   --   --   --   --   ALKPHOS  --  87 89   < > 66 66 64  --   --   --   --   BILITOT  --  0.3 0.3   < > 0.4 0.3 0.4  --   --   --   --    < > = values in this interval not displayed.    Studies:  US RENAL  Result Date: 06/12/2020 CLINICAL DATA:  Acute kidney injury EXAM: RENAL / URINARY TRACT ULTRASOUND COMPLETE COMPARISON:  CT abdomen and pelvis June 26, 2019 FINDINGS: Right Kidney: Renal measurements: 9.7 x 4.2 x 5.2 cm = volume: 111.7 mL. Echogenicity and renal cortical thickness are within normal limits. No mass, perinephric fluid, or hydronephrosis visualized. No sonographically demonstrable calculus or ureterectasis. Left Kidney: Renal measurements: 10.8 x 5.5 x 5.1 cm = volume: 157.9 mL. Echogenicity and renal cortical thickness are within normal limits. No mass, perinephric fluid, or hydronephrosis visualized. There is a prominent column of Bertin, an anatomic variant. No sonographically demonstrable calculus or ureterectasis Bladder: Appears normal for degree of bladder distention. Other: None. IMPRESSION: Study within normal limits. Electronically Signed   By: Bretta BangWilliam  Woodruff III M.D.   On: 06/12/2020 16:14   DG Chest  Portable 1 View  Result Date: 06/12/2020 CLINICAL DATA:  palpitations EXAM: PORTABLE CHEST 1 VIEW COMPARISON:  Chest radiograph 03/09/2020 FINDINGS: Unchanged, enlarged cardiac silhouette. There is no focal airspace consolidation. There is no large pleural effusion or visible pneumothorax. No acute osseous abnormality. IMPRESSION: Unchanged cardiomegaly.  No focal airspace disease. Electronically Signed   By: Caprice RenshawJacob  Kahn   On: 06/12/2020 10:33   ECHOCARDIOGRAM COMPLETE  Result Date: 06/13/2020    ECHOCARDIOGRAM REPORT   Patient Name:   Earvin HansenMICHELLE L Asbill Date of Exam: 06/13/2020 Medical Rec #:  [id number]         Height:       67.0 in Accession #:    [id number]        Weight:       200.0 lb Date of Birth:  05/29/1965         BSA:          2.022 m Patient Age:    55 years          BP:           120/57 mmHg Patient Gender: F                 HR:           80 bpm. Exam Location:  Inpatient Procedure: 2D Echo, Cardiac Doppler and Color Doppler Indications:    I50.33 Acute on chronic diastolic (congestive) heart failure  History:        Patient has prior history of Echocardiogram examinations, most                 recent 04/11/2019. Stroke; Risk Factors:Diabetes and Hypertension.  Sonographer:    Elmarie Shileyiffany Dance Referring Phys: [id number] Emeline GeneralPING T ZHANG IMPRESSIONS  1. Left ventricular ejection fraction, by estimation, is 60 to 65%. The left ventricle has normal function. The left ventricle has no regional wall motion abnormalities. There is mild concentric left ventricular hypertrophy. Left  ventricular diastolic parameters are indeterminate. Elevated left ventricular end-diastolic pressure.  2. Right ventricular systolic function is normal. The right ventricular size is mildly enlarged.  3. Right atrial size was moderately dilated.  4. The mitral valve is normal in structure. Mild mitral valve regurgitation. No evidence of mitral stenosis.  5. The aortic valve is normal in structure. Aortic valve regurgitation is not  visualized. No aortic stenosis is present.  6. The inferior vena cava is normal in size with greater than 50% respiratory variability, suggesting right atrial pressure of 3 mmHg. FINDINGS  Left Ventricle: Left ventricular ejection fraction, by estimation, is 60 to 65%. The left ventricle has normal function. The left ventricle has no regional wall motion abnormalities. The left ventricular internal cavity size was normal in size. There is  mild concentric left ventricular hypertrophy. Left ventricular diastolic parameters are indeterminate. Elevated left ventricular end-diastolic pressure. Right Ventricle: The right ventricular size is mildly enlarged. No increase in right ventricular wall thickness. Right ventricular systolic function is normal. Left Atrium: Left atrial size was normal in size. Right Atrium: Right atrial size was moderately dilated. Pericardium: There is no evidence of pericardial effusion. Mitral Valve: The mitral valve is normal in structure. Mild mitral valve regurgitation. No evidence of mitral valve stenosis. Tricuspid Valve: The tricuspid valve is normal in structure. Tricuspid valve regurgitation is mild . No evidence of tricuspid stenosis. Aortic Valve: The aortic valve is normal in structure. Aortic valve regurgitation is not visualized. No aortic stenosis is present. Pulmonic Valve: The pulmonic valve was normal in structure. Pulmonic valve regurgitation is not visualized. No evidence of pulmonic stenosis. Aorta: The aortic root is normal in size and structure. Venous: The inferior vena cava was not well visualized. The inferior vena cava is normal in size with greater than 50% respiratory variability, suggesting right atrial pressure of 3 mmHg. IAS/Shunts: No atrial level shunt detected by color flow Doppler.  LEFT VENTRICLE PLAX 2D LVIDd:         5.10 cm  Diastology LVIDs:         2.80 cm  LV e' medial:    6.09 cm/s LV PW:         1.27 cm  LV E/e' medial:  21.3 LV IVS:        1.10 cm   LV e' lateral:   9.90 cm/s LVOT diam:     1.80 cm  LV E/e' lateral: 13.1 LV SV:         47 LV SV Index:   23 LVOT Area:     2.54 cm  RIGHT VENTRICLE             IVC RV Basal diam:  4.10 cm     IVC diam: 2.20 cm RV Mid diam:    2.70 cm RV S prime:     12.00 cm/s TAPSE (M-mode): 2.3 cm LEFT ATRIUM             Index       RIGHT ATRIUM           Index LA diam:        4.90 cm 2.42 cm/m  RA Area:     29.10 cm LA Vol (A2C):   68.7 ml 33.97 ml/m RA Volume:   98.60 ml  48.76 ml/m LA Vol (A4C):   57.7 ml 28.53 ml/m LA Biplane Vol: 65.3 ml 32.29 ml/m  AORTIC VALVE LVOT Vmax:   95.95 cm/s LVOT Vmean:  62.700 cm/s LVOT VTI:  0.186 m  AORTA Ao Root diam: 3.20 cm Ao Asc diam:  2.70 cm MITRAL VALVE                TRICUSPID VALVE MV Area (PHT): 3.60 cm     TR Peak grad:   29.2 mmHg MV Decel Time: 211 msec     TR Vmax:        270.00 cm/s MV E velocity: 130.00 cm/s MV A velocity: 102.00 cm/s  SHUNTS MV E/A ratio:  1.27         Systemic VTI:  0.19 m                             Systemic Diam: 1.80 cm Armanda Magic MD Electronically signed by Armanda Magic MD Signature Date/Time: 06/13/2020/3:20:37 PM    Final

## 2020-06-15 NOTE — Discharge Instructions (Signed)
Advised to follow-up with primary care physician in 1 week. Advised to follow-up with Dr. Ave Filter as scheduled. Advised to take Keflex 500 mg twice daily for 5 more days to complete treatment for UTI Patient has been discharged home on home oxygen 2 L/min.

## 2020-06-15 NOTE — Progress Notes (Signed)
Patient O2 sats rechecked at rest, 94-96% RA, O2 tank was delivered to room and Rep made patient aware that she can call and arrange a payment plan, MD made aware

## 2020-06-15 NOTE — TOC Transition Note (Signed)
Transition of Care [facility]) - CM/SW Discharge Note   Patient Details  Name: [name] MRN: [id number] Date of Birth: 08/31/1965  [facility] Care [facility]) CM/SW Contact:  Coralyn Helling, LCSW Phone Number: 06/15/2020, 1:32 PM   Clinical Narrative:   Patient awaiting o2 from [employer]    Final next level of care: Home/Self Care Barriers to Discharge: No Barriers Identified   Patient Goals and CMS Choice     Choice offered to / list presented to : NA  Discharge Placement                       Discharge Plan and Services                DME Arranged: Oxygen DME Agency: Other - Comment [facility]) Date DME Agency Contacted: 06/15/20 Time DME Agency Contacted: [phone] Representative spoke with at DME Agency: [name] with [employer] HH Arranged: NA HH Agency: NA        Social Determinants of Health (SDOH) Interventions     Readmission Risk Interventions Readmission Risk Prevention Plan 03/11/2020 04/13/2019 11/13/2018  Transportation Screening Complete Complete Complete  PCP or Specialist Appt within 5-7 Days - - Complete  PCP or Specialist Appt within 3-5 Days Complete Complete -  Home Care Screening - - Complete  Medication Review (RN CM) - - Complete  HRI or Home Care Consult Complete Complete -  Social Work Consult for Recovery Care Planning/Counseling Complete Complete -  Palliative Care Screening Not Applicable Not Applicable -  Medication Review Oceanographer) Complete Complete -  Some recent data might be hidden

## 2020-06-16 DIAGNOSIS — J9621 Acute and chronic respiratory failure with hypoxia: Secondary | ICD-10-CM | POA: Diagnosis not present

## 2020-06-16 DIAGNOSIS — I5032 Chronic diastolic (congestive) heart failure: Secondary | ICD-10-CM | POA: Diagnosis not present

## 2020-06-16 LAB — BPAM PLATELET PHERESIS
Blood Product Expiration Date: 202206122359
Unit Type and Rh: 5100

## 2020-06-16 LAB — PREPARE PLATELET PHERESIS: Unit division: 0

## 2020-06-16 LAB — HEMOGLOBIN A1C
Hgb A1c MFr Bld: 6.2 % — ABNORMAL HIGH (ref 4.8–5.6)
Mean Plasma Glucose: 131 mg/dL

## 2020-06-18 LAB — ANTINUCLEAR ANTIBODIES, IFA: ANA Ab, IFA: NEGATIVE

## 2020-06-20 ENCOUNTER — Ambulatory Visit: Payer: Medicare Other

## 2020-06-25 DIAGNOSIS — E1165 Type 2 diabetes mellitus with hyperglycemia: Secondary | ICD-10-CM | POA: Diagnosis not present

## 2020-06-25 DIAGNOSIS — I10 Essential (primary) hypertension: Secondary | ICD-10-CM | POA: Diagnosis not present

## 2020-06-25 DIAGNOSIS — D6959 Other secondary thrombocytopenia: Secondary | ICD-10-CM | POA: Diagnosis not present

## 2020-06-25 DIAGNOSIS — I509 Heart failure, unspecified: Secondary | ICD-10-CM | POA: Diagnosis not present

## 2020-07-16 DIAGNOSIS — J9621 Acute and chronic respiratory failure with hypoxia: Secondary | ICD-10-CM | POA: Diagnosis not present

## 2020-07-16 DIAGNOSIS — I5032 Chronic diastolic (congestive) heart failure: Secondary | ICD-10-CM | POA: Diagnosis not present

## 2020-07-28 ENCOUNTER — Telehealth: Payer: Self-pay

## 2020-07-28 NOTE — Telephone Encounter (Signed)
Pt called and states she wants to see MD because, "I am starting to get bruises all over my arms and when that happens my platelets are low." This LPN offered pt appt with APP at our practice since MD is on PAL. Pt refused and states she prefers to wait until Dr Pamelia Hoit is back. Informed pt I would consult with Lillard Anes, NP and call her back to see if she needs to come in for labs. I attempted to call pt back to offer appt for labs. Pt did not answer. LVM for pt to return call.

## 2020-08-05 DIAGNOSIS — L84 Corns and callosities: Secondary | ICD-10-CM | POA: Diagnosis not present

## 2020-08-05 DIAGNOSIS — M2041 Other hammer toe(s) (acquired), right foot: Secondary | ICD-10-CM | POA: Diagnosis not present

## 2020-08-05 DIAGNOSIS — E1151 Type 2 diabetes mellitus with diabetic peripheral angiopathy without gangrene: Secondary | ICD-10-CM | POA: Diagnosis not present

## 2020-08-05 DIAGNOSIS — B351 Tinea unguium: Secondary | ICD-10-CM | POA: Diagnosis not present

## 2020-08-06 ENCOUNTER — Encounter: Payer: Self-pay | Admitting: Hematology and Oncology

## 2020-08-08 DIAGNOSIS — Z89511 Acquired absence of right leg below knee: Secondary | ICD-10-CM | POA: Diagnosis not present

## 2020-08-16 DIAGNOSIS — I5032 Chronic diastolic (congestive) heart failure: Secondary | ICD-10-CM | POA: Diagnosis not present

## 2020-08-16 DIAGNOSIS — J9621 Acute and chronic respiratory failure with hypoxia: Secondary | ICD-10-CM | POA: Diagnosis not present

## 2020-08-18 DIAGNOSIS — D6959 Other secondary thrombocytopenia: Secondary | ICD-10-CM | POA: Diagnosis not present

## 2020-08-18 DIAGNOSIS — I509 Heart failure, unspecified: Secondary | ICD-10-CM | POA: Diagnosis not present

## 2020-08-18 DIAGNOSIS — I10 Essential (primary) hypertension: Secondary | ICD-10-CM | POA: Diagnosis not present

## 2020-08-18 DIAGNOSIS — E1165 Type 2 diabetes mellitus with hyperglycemia: Secondary | ICD-10-CM | POA: Diagnosis not present

## 2020-08-25 ENCOUNTER — Encounter: Payer: Self-pay | Admitting: Hematology and Oncology

## 2020-09-05 DIAGNOSIS — E113511 Type 2 diabetes mellitus with proliferative diabetic retinopathy with macular edema, right eye: Secondary | ICD-10-CM | POA: Diagnosis not present

## 2020-09-05 DIAGNOSIS — E113492 Type 2 diabetes mellitus with severe nonproliferative diabetic retinopathy without macular edema, left eye: Secondary | ICD-10-CM | POA: Diagnosis not present

## 2020-09-05 DIAGNOSIS — H4051X3 Glaucoma secondary to other eye disorders, right eye, severe stage: Secondary | ICD-10-CM | POA: Diagnosis not present

## 2020-09-05 DIAGNOSIS — Z961 Presence of intraocular lens: Secondary | ICD-10-CM | POA: Diagnosis not present

## 2020-09-12 ENCOUNTER — Inpatient Hospital Stay (HOSPITAL_COMMUNITY)
Admission: EM | Admit: 2020-09-12 | Discharge: 2020-10-02 | DRG: 799 | Disposition: A | Discharge status: Rehabilitation Facility | Payer: Medicare Other | Attending: Student | Admitting: Student

## 2020-09-12 ENCOUNTER — Encounter: Payer: Self-pay | Admitting: Hematology and Oncology

## 2020-09-12 ENCOUNTER — Encounter (HOSPITAL_COMMUNITY): Payer: Self-pay | Admitting: Family Medicine

## 2020-09-12 ENCOUNTER — Emergency Department (HOSPITAL_COMMUNITY): Payer: Medicare Other

## 2020-09-12 DIAGNOSIS — N179 Acute kidney failure, unspecified: Secondary | ICD-10-CM | POA: Diagnosis not present

## 2020-09-12 DIAGNOSIS — E1151 Type 2 diabetes mellitus with diabetic peripheral angiopathy without gangrene: Secondary | ICD-10-CM | POA: Diagnosis present

## 2020-09-12 DIAGNOSIS — E1142 Type 2 diabetes mellitus with diabetic polyneuropathy: Secondary | ICD-10-CM | POA: Diagnosis not present

## 2020-09-12 DIAGNOSIS — I509 Heart failure, unspecified: Secondary | ICD-10-CM | POA: Diagnosis not present

## 2020-09-12 DIAGNOSIS — D75839 Thrombocytosis, unspecified: Secondary | ICD-10-CM | POA: Diagnosis not present

## 2020-09-12 DIAGNOSIS — R7881 Bacteremia: Secondary | ICD-10-CM | POA: Diagnosis not present

## 2020-09-12 DIAGNOSIS — Z9889 Other specified postprocedural states: Secondary | ICD-10-CM | POA: Diagnosis not present

## 2020-09-12 DIAGNOSIS — X58XXXA Exposure to other specified factors, initial encounter: Secondary | ICD-10-CM | POA: Diagnosis present

## 2020-09-12 DIAGNOSIS — J181 Lobar pneumonia, unspecified organism: Secondary | ICD-10-CM | POA: Diagnosis not present

## 2020-09-12 DIAGNOSIS — T8141XA Infection following a procedure, superficial incisional surgical site, initial encounter: Secondary | ICD-10-CM | POA: Diagnosis not present

## 2020-09-12 DIAGNOSIS — Z9114 Patient's other noncompliance with medication regimen: Secondary | ICD-10-CM

## 2020-09-12 DIAGNOSIS — G546 Phantom limb syndrome with pain: Secondary | ICD-10-CM | POA: Diagnosis not present

## 2020-09-12 DIAGNOSIS — J189 Pneumonia, unspecified organism: Secondary | ICD-10-CM | POA: Diagnosis not present

## 2020-09-12 DIAGNOSIS — Z20822 Contact with and (suspected) exposure to covid-19: Secondary | ICD-10-CM | POA: Diagnosis not present

## 2020-09-12 DIAGNOSIS — Z833 Family history of diabetes mellitus: Secondary | ICD-10-CM

## 2020-09-12 DIAGNOSIS — E611 Iron deficiency: Secondary | ICD-10-CM | POA: Diagnosis not present

## 2020-09-12 DIAGNOSIS — R0602 Shortness of breath: Secondary | ICD-10-CM

## 2020-09-12 DIAGNOSIS — Z89511 Acquired absence of right leg below knee: Secondary | ICD-10-CM

## 2020-09-12 DIAGNOSIS — E1165 Type 2 diabetes mellitus with hyperglycemia: Secondary | ICD-10-CM | POA: Diagnosis not present

## 2020-09-12 DIAGNOSIS — D649 Anemia, unspecified: Secondary | ICD-10-CM | POA: Diagnosis not present

## 2020-09-12 DIAGNOSIS — R0902 Hypoxemia: Secondary | ICD-10-CM

## 2020-09-12 DIAGNOSIS — J9621 Acute and chronic respiratory failure with hypoxia: Secondary | ICD-10-CM | POA: Diagnosis not present

## 2020-09-12 DIAGNOSIS — N1831 Chronic kidney disease, stage 3a: Secondary | ICD-10-CM | POA: Diagnosis not present

## 2020-09-12 DIAGNOSIS — N183 Chronic kidney disease, stage 3 unspecified: Secondary | ICD-10-CM | POA: Diagnosis not present

## 2020-09-12 DIAGNOSIS — I5032 Chronic diastolic (congestive) heart failure: Secondary | ICD-10-CM | POA: Diagnosis not present

## 2020-09-12 DIAGNOSIS — K3189 Other diseases of stomach and duodenum: Secondary | ICD-10-CM | POA: Diagnosis not present

## 2020-09-12 DIAGNOSIS — J869 Pyothorax without fistula: Secondary | ICD-10-CM | POA: Diagnosis not present

## 2020-09-12 DIAGNOSIS — J90 Pleural effusion, not elsewhere classified: Secondary | ICD-10-CM | POA: Diagnosis not present

## 2020-09-12 DIAGNOSIS — R4182 Altered mental status, unspecified: Secondary | ICD-10-CM | POA: Diagnosis not present

## 2020-09-12 DIAGNOSIS — J9611 Chronic respiratory failure with hypoxia: Secondary | ICD-10-CM | POA: Diagnosis not present

## 2020-09-12 DIAGNOSIS — R509 Fever, unspecified: Secondary | ICD-10-CM

## 2020-09-12 DIAGNOSIS — R5381 Other malaise: Secondary | ICD-10-CM | POA: Diagnosis not present

## 2020-09-12 DIAGNOSIS — T8144XA Sepsis following a procedure, initial encounter: Secondary | ICD-10-CM | POA: Diagnosis not present

## 2020-09-12 DIAGNOSIS — M7989 Other specified soft tissue disorders: Secondary | ICD-10-CM | POA: Diagnosis not present

## 2020-09-12 DIAGNOSIS — Z6836 Body mass index (BMI) 36.0-36.9, adult: Secondary | ICD-10-CM | POA: Diagnosis not present

## 2020-09-12 DIAGNOSIS — K6389 Other specified diseases of intestine: Secondary | ICD-10-CM | POA: Diagnosis not present

## 2020-09-12 DIAGNOSIS — I700 Atherosclerosis of aorta: Secondary | ICD-10-CM | POA: Diagnosis not present

## 2020-09-12 DIAGNOSIS — R161 Splenomegaly, not elsewhere classified: Secondary | ICD-10-CM | POA: Diagnosis present

## 2020-09-12 DIAGNOSIS — D696 Thrombocytopenia, unspecified: Secondary | ICD-10-CM | POA: Diagnosis not present

## 2020-09-12 DIAGNOSIS — Z9081 Acquired absence of spleen: Secondary | ICD-10-CM | POA: Diagnosis not present

## 2020-09-12 DIAGNOSIS — B3749 Other urogenital candidiasis: Secondary | ICD-10-CM | POA: Diagnosis not present

## 2020-09-12 DIAGNOSIS — Z79899 Other long term (current) drug therapy: Secondary | ICD-10-CM

## 2020-09-12 DIAGNOSIS — I5043 Acute on chronic combined systolic (congestive) and diastolic (congestive) heart failure: Secondary | ICD-10-CM | POA: Diagnosis not present

## 2020-09-12 DIAGNOSIS — I10 Essential (primary) hypertension: Secondary | ICD-10-CM | POA: Diagnosis not present

## 2020-09-12 DIAGNOSIS — E876 Hypokalemia: Secondary | ICD-10-CM | POA: Diagnosis not present

## 2020-09-12 DIAGNOSIS — N1832 Chronic kidney disease, stage 3b: Secondary | ICD-10-CM | POA: Diagnosis not present

## 2020-09-12 DIAGNOSIS — J9811 Atelectasis: Secondary | ICD-10-CM | POA: Diagnosis not present

## 2020-09-12 DIAGNOSIS — E079 Disorder of thyroid, unspecified: Secondary | ICD-10-CM

## 2020-09-12 DIAGNOSIS — N020 Recurrent and persistent hematuria with minor glomerular abnormality: Secondary | ICD-10-CM

## 2020-09-12 DIAGNOSIS — D62 Acute posthemorrhagic anemia: Secondary | ICD-10-CM | POA: Diagnosis not present

## 2020-09-12 DIAGNOSIS — D693 Immune thrombocytopenic purpura: Secondary | ICD-10-CM | POA: Diagnosis not present

## 2020-09-12 DIAGNOSIS — E042 Nontoxic multinodular goiter: Secondary | ICD-10-CM | POA: Diagnosis not present

## 2020-09-12 DIAGNOSIS — R600 Localized edema: Secondary | ICD-10-CM

## 2020-09-12 DIAGNOSIS — E669 Obesity, unspecified: Secondary | ICD-10-CM | POA: Diagnosis not present

## 2020-09-12 DIAGNOSIS — Z23 Encounter for immunization: Secondary | ICD-10-CM | POA: Diagnosis not present

## 2020-09-12 DIAGNOSIS — E1122 Type 2 diabetes mellitus with diabetic chronic kidney disease: Secondary | ICD-10-CM | POA: Diagnosis present

## 2020-09-12 DIAGNOSIS — D509 Iron deficiency anemia, unspecified: Secondary | ICD-10-CM

## 2020-09-12 DIAGNOSIS — E875 Hyperkalemia: Secondary | ICD-10-CM | POA: Diagnosis not present

## 2020-09-12 DIAGNOSIS — T8140XA Infection following a procedure, unspecified, initial encounter: Secondary | ICD-10-CM | POA: Diagnosis not present

## 2020-09-12 DIAGNOSIS — E119 Type 2 diabetes mellitus without complications: Secondary | ICD-10-CM

## 2020-09-12 DIAGNOSIS — E1169 Type 2 diabetes mellitus with other specified complication: Secondary | ICD-10-CM | POA: Diagnosis not present

## 2020-09-12 DIAGNOSIS — I130 Hypertensive heart and chronic kidney disease with heart failure and stage 1 through stage 4 chronic kidney disease, or unspecified chronic kidney disease: Secondary | ICD-10-CM | POA: Diagnosis present

## 2020-09-12 DIAGNOSIS — D61818 Other pancytopenia: Secondary | ICD-10-CM | POA: Diagnosis not present

## 2020-09-12 DIAGNOSIS — Z8249 Family history of ischemic heart disease and other diseases of the circulatory system: Secondary | ICD-10-CM

## 2020-09-12 DIAGNOSIS — H1131 Conjunctival hemorrhage, right eye: Secondary | ICD-10-CM | POA: Diagnosis present

## 2020-09-12 DIAGNOSIS — I5033 Acute on chronic diastolic (congestive) heart failure: Secondary | ICD-10-CM | POA: Diagnosis present

## 2020-09-12 DIAGNOSIS — J9601 Acute respiratory failure with hypoxia: Secondary | ICD-10-CM | POA: Diagnosis not present

## 2020-09-12 DIAGNOSIS — I517 Cardiomegaly: Secondary | ICD-10-CM | POA: Diagnosis not present

## 2020-09-12 DIAGNOSIS — E871 Hypo-osmolality and hyponatremia: Secondary | ICD-10-CM | POA: Diagnosis not present

## 2020-09-12 DIAGNOSIS — R091 Pleurisy: Secondary | ICD-10-CM | POA: Diagnosis not present

## 2020-09-12 DIAGNOSIS — S40022A Contusion of left upper arm, initial encounter: Secondary | ICD-10-CM | POA: Diagnosis present

## 2020-09-12 DIAGNOSIS — Z83438 Family history of other disorder of lipoprotein metabolism and other lipidemia: Secondary | ICD-10-CM

## 2020-09-12 DIAGNOSIS — Z8673 Personal history of transient ischemic attack (TIA), and cerebral infarction without residual deficits: Secondary | ICD-10-CM

## 2020-09-12 DIAGNOSIS — Z993 Dependence on wheelchair: Secondary | ICD-10-CM

## 2020-09-12 DIAGNOSIS — N319 Neuromuscular dysfunction of bladder, unspecified: Secondary | ICD-10-CM | POA: Diagnosis present

## 2020-09-12 DIAGNOSIS — Z0389 Encounter for observation for other suspected diseases and conditions ruled out: Secondary | ICD-10-CM | POA: Diagnosis not present

## 2020-09-12 DIAGNOSIS — Z862 Personal history of diseases of the blood and blood-forming organs and certain disorders involving the immune mechanism: Secondary | ICD-10-CM

## 2020-09-12 DIAGNOSIS — I110 Hypertensive heart disease with heart failure: Secondary | ICD-10-CM | POA: Diagnosis not present

## 2020-09-12 DIAGNOSIS — E041 Nontoxic single thyroid nodule: Secondary | ICD-10-CM | POA: Diagnosis not present

## 2020-09-12 DIAGNOSIS — R933 Abnormal findings on diagnostic imaging of other parts of digestive tract: Secondary | ICD-10-CM | POA: Diagnosis not present

## 2020-09-12 DIAGNOSIS — A411 Sepsis due to other specified staphylococcus: Secondary | ICD-10-CM | POA: Diagnosis not present

## 2020-09-12 DIAGNOSIS — N139 Obstructive and reflux uropathy, unspecified: Secondary | ICD-10-CM | POA: Diagnosis not present

## 2020-09-12 DIAGNOSIS — N189 Chronic kidney disease, unspecified: Secondary | ICD-10-CM | POA: Diagnosis not present

## 2020-09-12 DIAGNOSIS — Z7982 Long term (current) use of aspirin: Secondary | ICD-10-CM

## 2020-09-12 DIAGNOSIS — Y95 Nosocomial condition: Secondary | ICD-10-CM | POA: Diagnosis not present

## 2020-09-12 LAB — BASIC METABOLIC PANEL
Anion gap: 7 (ref 5–15)
BUN: 40 mg/dL — ABNORMAL HIGH (ref 6–20)
CO2: 19 mmol/L — ABNORMAL LOW (ref 22–32)
Calcium: 7.9 mg/dL — ABNORMAL LOW (ref 8.9–10.3)
Chloride: 108 mmol/L (ref 98–111)
Creatinine, Ser: 1.83 mg/dL — ABNORMAL HIGH (ref 0.44–1.00)
GFR, Estimated: 33 mL/min — ABNORMAL LOW (ref >60)
Glucose, Bld: 86 mg/dL (ref 70–99)
Potassium: 5.7 mmol/L — ABNORMAL HIGH (ref 3.5–5.1)
Sodium: 134 mmol/L — ABNORMAL LOW (ref 135–145)

## 2020-09-12 LAB — I-STAT VENOUS BLOOD GAS, ED
Acid-base deficit: 5 mmol/L — ABNORMAL HIGH (ref 0.0–2.0)
Bicarbonate: 20.9 mmol/L (ref 20.0–28.0)
Calcium, Ion: 1.12 mmol/L — ABNORMAL LOW (ref 1.15–1.40)
HCT: 16 % — ABNORMAL LOW (ref 36.0–46.0)
Hemoglobin: 5.4 g/dL — CL (ref 12.0–15.0)
O2 Saturation: 99 %
Potassium: 5.8 mmol/L — ABNORMAL HIGH (ref 3.5–5.1)
Sodium: 138 mmol/L (ref 135–145)
TCO2: 22 mmol/L (ref 22–32)
pCO2, Ven: 43.5 mmHg — ABNORMAL LOW (ref 44.0–60.0)
pH, Ven: 7.289 (ref 7.250–7.430)
pO2, Ven: 150 mmHg — ABNORMAL HIGH (ref 32.0–45.0)

## 2020-09-12 LAB — CBC
HCT: 17.8 % — ABNORMAL LOW (ref 36.0–46.0)
Hemoglobin: 4.5 g/dL — CL (ref 12.0–15.0)
MCH: 21.3 pg — ABNORMAL LOW (ref 26.0–34.0)
MCHC: 25.3 g/dL — ABNORMAL LOW (ref 30.0–36.0)
MCV: 84.4 fL (ref 80.0–100.0)
Platelets: <5 10*3/uL — CL (ref 150–400)
RBC: 2.11 MIL/uL — ABNORMAL LOW (ref 3.87–5.11)
RDW: 21.4 % — ABNORMAL HIGH (ref 11.5–15.5)
WBC: 2.8 10*3/uL — ABNORMAL LOW (ref 4.0–10.5)
nRBC: 0.7 % — ABNORMAL HIGH (ref 0.0–0.2)

## 2020-09-12 LAB — TROPONIN I (HIGH SENSITIVITY): Troponin I (High Sensitivity): 8 ng/L (ref <18)

## 2020-09-12 LAB — PREPARE RBC (CROSSMATCH)

## 2020-09-12 LAB — RESP PANEL BY RT-PCR (FLU A&B, COVID) ARPGX2
Influenza A by PCR: NEGATIVE
Influenza B by PCR: NEGATIVE
SARS Coronavirus 2 by RT PCR: NEGATIVE

## 2020-09-12 LAB — IRON AND TIBC
Iron: 12 ug/dL — ABNORMAL LOW (ref 28–170)
Saturation Ratios: 3 % — ABNORMAL LOW (ref 10.4–31.8)
TIBC: 400 ug/dL (ref 250–450)
UIBC: 388 ug/dL

## 2020-09-12 LAB — BRAIN NATRIURETIC PEPTIDE: B Natriuretic Peptide: 1139.8 pg/mL — ABNORMAL HIGH (ref 0.0–100.0)

## 2020-09-12 LAB — FERRITIN: Ferritin: 27 ng/mL (ref 11–307)

## 2020-09-12 MED ORDER — FOLIC ACID 1 MG PO TABS
2.0000 mg | ORAL_TABLET | Freq: Every day | ORAL | Status: DC
  Administered 2020-09-12 – 2020-10-02 (×21): 2 mg via ORAL
  Filled 2020-09-12 (×20): qty 2

## 2020-09-12 MED ORDER — ACETAMINOPHEN 325 MG PO TABS
650.0000 mg | ORAL_TABLET | ORAL | Status: DC | PRN
  Administered 2020-09-13 – 2020-09-16 (×3): 650 mg via ORAL
  Filled 2020-09-12 (×3): qty 2

## 2020-09-12 MED ORDER — SODIUM CHLORIDE 0.9 % IV SOLN: 10.0000 mL/h | Freq: Once | INTRAVENOUS | Status: DC

## 2020-09-12 MED ORDER — FUROSEMIDE 10 MG/ML IJ SOLN
20.0000 mg | Freq: Once | INTRAMUSCULAR | Status: AC
  Administered 2020-09-12: 20 mg via INTRAVENOUS
  Filled 2020-09-12: qty 2

## 2020-09-12 MED ORDER — SODIUM ZIRCONIUM CYCLOSILICATE 10 G PO PACK
10.0000 g | PACK | Freq: Once | ORAL | Status: AC
  Administered 2020-09-12: 10 g via ORAL
  Filled 2020-09-12: qty 1

## 2020-09-12 MED ORDER — DEXAMETHASONE SODIUM PHOSPHATE 10 MG/ML IJ SOLN: 40.0000 mg | INTRAMUSCULAR | Status: DC

## 2020-09-12 MED ORDER — ONDANSETRON HCL 4 MG/2ML IJ SOLN
4.0000 mg | Freq: Four times a day (QID) | INTRAMUSCULAR | Status: DC | PRN
  Administered 2020-09-28: 4 mg via INTRAVENOUS
  Filled 2020-09-12: qty 2

## 2020-09-12 MED ORDER — SODIUM CHLORIDE 0.9 % IV SOLN
10.0000 mL/h | Freq: Once | INTRAVENOUS | Status: AC
  Administered 2020-09-12: 10 mL/h via INTRAVENOUS

## 2020-09-12 MED ORDER — INSULIN ASPART 100 UNIT/ML IJ SOLN: 0.0000 [IU] | INTRAMUSCULAR | Status: DC

## 2020-09-12 MED ORDER — FUROSEMIDE 10 MG/ML IJ SOLN
40.0000 mg | Freq: Once | INTRAMUSCULAR | Status: AC
  Administered 2020-09-12: 40 mg via INTRAVENOUS
  Filled 2020-09-12: qty 4

## 2020-09-12 MED ORDER — DEXAMETHASONE SODIUM PHOSPHATE 10 MG/ML IJ SOLN
40.0000 mg | Freq: Once | INTRAMUSCULAR | Status: AC
  Administered 2020-09-12: 40 mg via INTRAVENOUS
  Filled 2020-09-12: qty 4

## 2020-09-12 MED ORDER — FUROSEMIDE 10 MG/ML IJ SOLN
40.0000 mg | Freq: Two times a day (BID) | INTRAMUSCULAR | Status: DC
  Administered 2020-09-13 – 2020-09-16 (×7): 40 mg via INTRAVENOUS
  Filled 2020-09-12 (×8): qty 4

## 2020-09-12 MED ORDER — SODIUM CHLORIDE 0.9 % IV SOLN: 250.0000 mL | INTRAVENOUS | Status: DC | PRN

## 2020-09-12 MED ORDER — DIPHENHYDRAMINE HCL 25 MG PO CAPS
25.0000 mg | ORAL_CAPSULE | Freq: Once | ORAL | Status: AC
  Administered 2020-09-12: 25 mg via ORAL
  Filled 2020-09-12: qty 1

## 2020-09-12 MED ORDER — INSULIN ASPART 100 UNIT/ML IJ SOLN
0.0000 [IU] | Freq: Three times a day (TID) | INTRAMUSCULAR | Status: DC
  Administered 2020-09-13: 3 [IU] via SUBCUTANEOUS
  Administered 2020-09-13: 2 [IU] via SUBCUTANEOUS
  Administered 2020-09-14 (×2): 3 [IU] via SUBCUTANEOUS
  Administered 2020-09-14: 4 [IU] via SUBCUTANEOUS
  Administered 2020-09-15 (×2): 5 [IU] via SUBCUTANEOUS
  Administered 2020-09-15: 4 [IU] via SUBCUTANEOUS
  Administered 2020-09-16: 5 [IU] via SUBCUTANEOUS

## 2020-09-12 MED ORDER — IMMUNE GLOBULIN (HUMAN) 10 GM/100ML IV SOLN
100.0000 g | Freq: Every day | INTRAVENOUS | Status: AC
  Administered 2020-09-13 – 2020-09-14 (×2): 100 g via INTRAVENOUS
  Filled 2020-09-12: qty 800
  Filled 2020-09-12 (×2): qty 1000

## 2020-09-12 MED ORDER — PANTOPRAZOLE SODIUM 40 MG IV SOLR
40.0000 mg | Freq: Once | INTRAVENOUS | Status: AC
  Administered 2020-09-12: 40 mg via INTRAVENOUS
  Filled 2020-09-12: qty 40

## 2020-09-12 MED ORDER — ACETAMINOPHEN 325 MG PO TABS
650.0000 mg | ORAL_TABLET | Freq: Once | ORAL | Status: AC
  Administered 2020-09-12: 650 mg via ORAL
  Filled 2020-09-12: qty 2

## 2020-09-12 MED ORDER — PANTOPRAZOLE SODIUM 40 MG IV SOLR
40.0000 mg | INTRAVENOUS | Status: DC
  Administered 2020-09-13 – 2020-09-15 (×3): 40 mg via INTRAVENOUS
  Filled 2020-09-12 (×3): qty 40

## 2020-09-12 NOTE — H&P (Signed)
History and Physical    [name] GXQ:[id number] DOB: 1965/03/16 DOA: 09/12/2020  PCP: Clovia Cuff, MD   Patient coming from: home   Chief Complaint: SOB, DOE, fatigue, bruising   HPI: [name] is a 55 y.o. female with medical history significant for ITP, type 2 diabetes mellitus, chronic kidney disease, chronic diastolic CHF, chronic hypoxic respiratory failure, history, presenting with shortness of breath, fatigue, and bruising. Symptoms have been progressing over approximately one month and she is now unable to get up without assistance. She has been out of Lasix for ~1 month. She has not followed up with hematology recently because she had been busy and was not feeling bad. She sees a little red blood on the paper when she wipes but denies melena, hematochezia, or other bleeding. She has atraumatic bruising to left arm and leg. No headache or focal numbness or weakness.   ED Course: Upon arrival to the ED, patient is found to be afebrile, saturating well on 2 Lpm, and with stable BP. CBC concerning for WBC 2.8, Hgb 4.5, and platelets <5. Chemistries notable for potassium 5.7, bicarbonate 19, and creatinine 1.83. CXR with cardiomegaly, small pleural effusions, and mild pulmonary edema. Hematology was consulted by the ED physician and patient was treated with IV Lasix, Protonix, dexamethasone, and Lokelma. 2 units RBC were ordered.   Review of Systems:  All other systems reviewed and apart from HPI, are negative.  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 teenager    States she had burst blood vessel as teenager, now  with resultant minor visual field and hearing deficits   Neuropathy    Stroke [facility]) ~ 1982   "my feeling on my RLE, right lower eye vision,  & hearing out of my right ear not the same since" (11/25/2015)   Type II diabetes mellitus ([place])     Past Surgical History:  Procedure Laterality Date   AMPUTATION  11/24/2010   Procedure: AMPUTATION FOOT;  Surgeon: Wylene Simmer, MD;  Location: [place];  Service: Orthopedics;  Laterality: Right;  Right  FOOT TRANS METATARSAL AMPUTATION   AMPUTATION  07/02/2011   Procedure: AMPUTATION BELOW KNEE;  Surgeon: Wylene Simmer, MD;  Location: [place];  Service: Orthopedics;  Laterality: Right;   APPLICATION OF WOUND VAC  07/02/2011   Procedure: APPLICATION OF WOUND VAC;  Surgeon: Wylene Simmer, MD;  Location: [place];  Service: Orthopedics;  Laterality: Right;   BRAIN SURGERY  1980's   Previous brain surgery in [place], [place]  2004; 2006   COLONOSCOPY N/A 09/17/2015   Procedure: COLONOSCOPY;  Surgeon: Wilford Corner, MD;  Location: [facility] ENDOSCOPY;  Service: Endoscopy;  Laterality: N/A;   ESOPHAGOGASTRODUODENOSCOPY N/A 09/17/2015   Procedure: ESOPHAGOGASTRODUODENOSCOPY (EGD);  Surgeon: Wilford Corner, MD;  Location: [facility] ENDOSCOPY;  Service: Endoscopy;  Laterality: N/A;   I & D EXTREMITY  11/24/2010   Procedure: IRRIGATION AND DEBRIDEMENT EXTREMITY;  Surgeon: Wylene Simmer, MD;  Location: [place];  Service: Orthopedics;  Laterality: Left;  Debriedment of left plantar ulcer and trimming of toenails   I & D EXTREMITY  07/02/2011   Procedure: IRRIGATION AND DEBRIDEMENT EXTREMITY;  Surgeon: Wylene Simmer, MD;  Location: [place];  Service: Orthopedics;  Laterality: Left;  I & D of Left Foot   I & D EXTREMITY  07/06/2011   Procedure: IRRIGATION AND DEBRIDEMENT EXTREMITY;  Surgeon: Wylene Simmer, MD;  Location: [place];  Service: Orthopedics;  Laterality: Right;  IRRIGATION/DEBRIDEMENT RIGHT BELOW KNEE AMPUTATION   TUBAL LIGATION  2006    Social History:   reports that she  has never smoked. She has never used smokeless tobacco. She reports current alcohol use of about 2.0 standard drinks per week. She reports that she does not use drugs.  No Known Allergies  Family History  Problem Relation Age of Onset   Diabetes Mother    Hypertension Mother    Hyperlipidemia Mother    Diabetes Father    Cancer Father        prostate   Hyperlipidemia Father    Hypertension Father    Diabetes Brother    Peripheral Artery Disease Brother        has had toes amputated   Diabetes Son      Prior to Admission medications   Medication Sig Start Date End Date Taking? Authorizing Provider  amLODipine (NORVASC) 5 MG tablet Take 1 tablet (5 mg total) by mouth daily. 01/28/20 01/27/21  Vevelyn Francois, NP  aspirin EC 81 MG tablet Take 81 mg by mouth daily as needed for moderate pain. Swallow whole.    [provider]  Blood Glucose Monitoring Suppl (PRODIGY AUTOCODE BLOOD GLUCOSE) w/Device KIT 1 kit by Does not apply route in the morning, at noon, in the evening, and at bedtime. 06/06/19 06/05/20  Vevelyn Francois, NP  ferrous gluconate (FERGON) 324 MG tablet Take 1 tablet (324 mg total) by mouth daily with breakfast. 06/16/20   Shawna Clamp, MD  furosemide (LASIX) 40 MG tablet Take 160 mg by mouth daily. 04/02/20   [provider]  gabapentin (NEURONTIN) 400 MG capsule Take 1 capsule (400 mg total) by mouth 2 (two) times daily. 01/28/20 01/27/21  Vevelyn Francois, NP  isosorbide mononitrate (IMDUR) 30 MG 24 hr tablet Take 1 tablet (30 mg total) by mouth daily. 01/28/20 01/22/21  Vevelyn Francois, NP  metoprolol succinate (TOPROL-XL) 50 MG 24 hr tablet Take 1 tablet (50 mg total) by mouth daily. Take with or immediately following a meal. 01/28/20 01/22/21  Vevelyn Francois, NP  polyvinyl alcohol (LIQUIFILM TEARS) 1.4 % ophthalmic solution Place 1 drop into both eyes as needed for dry eyes.    [provider]  rosuvastatin (CRESTOR) 20 MG tablet Take 1 tablet (20 mg  total) by mouth daily. 01/28/20 01/22/21  Vevelyn Francois, NP    Physical Exam: Vitals:   09/12/20 1523 09/12/20 1803 09/12/20 1830  BP: (!) 133/56 131/66 125/78  Pulse: 67 66 66  Resp: '17 18 20  ' Temp: 97.8 F (36.6 C) 98 F (36.7 C)   TempSrc: Oral Oral   SpO2: 99% 100% 100%    Constitutional: NAD, calm  Eyes: PERTLA, lids normal, conjunctival hemorrhage  ENMT: Mucous membranes are moist. Posterior pharynx clear of any exudate or lesions.   Neck: supple, no masses  Respiratory:  Fine rales bilaterally, no wheezing. No accessory muscle use.  Cardiovascular: S1 & S2 heard, regular rate and rhythm. LE pitting edema into thighs.  Abdomen: No distension, no tenderness, soft. Bowel sounds active.  Musculoskeletal: no clubbing / cyanosis. S/p right BKA.  Skin: Scattered ecchymoses. Warm, dry, well-perfused. Neurologic: CN 2-12 grossly intact. Sensation intact. Moving all extremities.  Psychiatric: Alert and oriented to person, place, and situation. Very pleasant and cooperative.    Labs and Imaging on Admission: I have personally reviewed following labs and imaging studies  CBC: Recent Labs  Lab 09/12/20 1552 09/12/20 1910  WBC 2.8*  --   HGB 4.5* 5.4*  HCT 17.8* 16.0*  MCV 84.4  --   PLT <5*  --    Basic Metabolic Panel: Recent Labs  Lab 09/12/20 1552 09/12/20 1910  NA 134* 138  K 5.7* 5.8*  CL 108  --   CO2 19*  --   GLUCOSE 86  --   BUN 40*  --   CREATININE 1.83*  --   CALCIUM 7.9*  --    GFR: CrCl cannot be calculated (Unknown ideal weight.). Liver Function Tests: No results for input(s): AST, ALT, ALKPHOS, BILITOT, PROT, ALBUMIN in the last 168 hours. No results for input(s): LIPASE, AMYLASE in the last 168 hours. No results for input(s): AMMONIA in the last 168 hours. Coagulation Profile: No results for input(s): INR, PROTIME in the last 168 hours. Cardiac Enzymes: No results for input(s): CKTOTAL, CKMB, CKMBINDEX, TROPONINI in the last 168 hours. BNP  (last 3 results) No results for input(s): PROBNP in the last 8760 hours. HbA1C: No results for input(s): HGBA1C in the last 72 hours. CBG: No results for input(s): GLUCAP in the last 168 hours. Lipid Profile: No results for input(s): CHOL, HDL, LDLCALC, TRIG, CHOLHDL, LDLDIRECT in the last 72 hours. Thyroid Function Tests: No results for input(s): TSH, T4TOTAL, FREET4, T3FREE, THYROIDAB in the last 72 hours. Anemia Panel: Recent Labs    09/12/20 1838  FERRITIN 27  TIBC 400  IRON 12*   Urine analysis:    Component Value Date/Time   COLORURINE YELLOW 06/13/2020 1318   APPEARANCEUR HAZY (A) 06/13/2020 1318   LABSPEC 1.006 06/13/2020 1318   PHURINE 5.0 06/13/2020 1318   GLUCOSEU NEGATIVE 06/13/2020 1318   HGBUR SMALL (A) 06/13/2020 1318   BILIRUBINUR NEGATIVE 06/13/2020 1318   KETONESUR NEGATIVE 06/13/2020 1318   PROTEINUR NEGATIVE 06/13/2020 1318   UROBILINOGEN 2.0 (H) 07/08/2011 0051   NITRITE NEGATIVE 06/13/2020 1318   LEUKOCYTESUR LARGE (A) 06/13/2020 1318   Sepsis Labs: '@LABRCNTIP' (procalcitonin:4,lacticidven:4) )No results found for this or any previous visit (from the past 240 hour(s)).   Radiological Exams on Admission: DG Chest 2 View  Result Date: 09/12/2020 CLINICAL DATA:  CHF, leg swelling EXAM: CHEST - 2 VIEW COMPARISON:  06/12/2020 FINDINGS: Cardiomegaly. Mild, diffuse bilateral interstitial pulmonary opacity and small, layering bilateral pleural effusions. The visualized skeletal structures are unremarkable. IMPRESSION: Cardiomegaly with mild, diffuse bilateral interstitial pulmonary opacity and small, layering bilateral pleural effusions, findings most consistent with edema. Electronically Signed   By: Eddie Candle M.D.   On: 09/12/2020 16:47     Assessment/Plan   1. ITP; pancytopenia; symptomatic anemia  - Patient with ITP who has not followed up with hematology due to being busy and feeling okay now presents with fatigue, DOE (some of which d/t CHF), and  ecchymoses and is found to have WBC 2800 with Hgb 4.5 and platelets ,<5000  - She has some blood on wiping but denies melena, hematochezia, or other bleeding  - Hematology consult much appreciated, planning for steroids, IVIG, and RBC transfusion tonight; may need splenectomy   2. Acute on chronic diastolic CHF; chronic hypoxic respiratory failure  - Presents with leg swelling  and SOB after running out of Lasix and is found to have pulmonary edema and small pleural effusions on CXR  - EF was preserved on TTE in June 2022  - She was given 40 mg IV Lasix in ED  - Continue diuresis with IV Lasix, monitor I/Os and weight, monitor renal function and electrolytes    3. CKD IIIb - SCr is 1.83 on admission, up from 1.51 in June 2022  - Renally-dose medications, monitor closely while diuresing    4. Type II DM  - A1c was 6.2% in June 2022  - She will be on systemic steroids, hold metformin and use SSI for now     DVT prophylaxis: SCD  Code Status: Full  Level of Care: Level of care: Progressive Family Communication: None present  Disposition Plan:  Patient is from: home  Anticipated d/c is to: TBD Anticipated d/c date is: 09/15/20 Patient currently: Pending improvement/stability in blood counts and oxygenation  Consults called: Hematology  Admission status: Inpatient     Vianne Bulls, MD [facility]  09/12/2020, 8:19 PM

## 2020-09-12 NOTE — Consult Note (Signed)
Referral MD  Reason for Referral: Profound thrombocytopenia and anemia-multiply relapsed ITP  Chief Complaint  Patient presents with   Leg Swelling  : I just had a hard time breathing and was bruising.  HPI: [name] is well-known to the Hematology service.  She is a 55 year old African-American female.  She has a history of relapsing ITP.  She has not compliant with her follow-up at the [facility].  Her last visit was back in June.  She said that she was just too busy to go back and she was feeling well.  She has been on multiple courses of IVIG and Nplate..  She does have diabetes.  I think this is probably why she is not being given steroids chronically.  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  She became short of breath at home.  She had a hard time getting up.  As such, she ultimately ended up in the emergency room.  She was found to have a white cell count of 2.8.  Hemoglobin 4.5.  Platelet count less than 5000. Her chemistry studies show sodium of 134.  Potassium 5.7.  BUN 40 creatinine 1.83.  Her iron studies show an iron saturation of only 3%.  I am sure she probably is bleeding.  She does not have any monthly cycles.  She does have chronic renal insufficiency.  Again, she probably is not compliant with medications or with her follow-up.  She has been dealing with relapsed ITP for a couple years at least.  She is seen at multiple doctors at the [facility].  It has been recommended that she have a splenectomy.  She has declined this.  She now comes back in with another relapse of her ITP.  She has had no fever.  There is no obvious COVID exposure.  She denies any obvious melena or bright red blood per rectum.  There is no hematuria.  She had a colonoscopy and upper endoscopy back in 2017.  She had acute gastritis.  She had some external and internal hemorrhoids.  She did have heme positive stools in June 2022.  I know this is incredibly frustrating.  Again, she  has these relapses.  Is certainly possible and likely that a splenectomy would help her.  Currently, she is not the best shape.  She has multiple health issues.  She did have a echocardiogram done back in June 2022.  Showed left ventricular ejection fraction of 60-65%.  She had relatively normal valves.  She had moderately dilated right atrium.  We are now asked to see her again because of her profound thrombocytopenia and anemia.  Past Medical History:  Diagnosis Date   Acute exacerbation of CHF (congestive heart failure) ([place]) 04/11/2019   Anemia    Arthritis    "spine, hands" (11/25/2015)   Back pain    "all my back; probably 3 times/week" (11/25/2015)   Chronic indwelling Foley catheter 03/04/2017   Diabetic foot ulcer associated with type 2 diabetes mellitus ([place])    Hypertension    Intracerebral hemorrhage ([place]) As a teenager    States she had burst blood vessel as teenager, now with resultant minor visual field and hearing deficits   Neuropathy    Stroke [facility]) ~ 1982   "my feeling on my RLE, right lower eye vision,  & hearing out of my right ear not the same since" (11/25/2015)   Type II diabetes mellitus ([place])   :   Past Surgical History:  Procedure Laterality  Date   AMPUTATION  11/24/2010   Procedure: AMPUTATION FOOT;  Surgeon: Wylene Simmer, MD;  Location: [place];  Service: Orthopedics;  Laterality: Right;  Right  FOOT TRANS METATARSAL AMPUTATION   AMPUTATION  07/02/2011   Procedure: AMPUTATION BELOW KNEE;  Surgeon: Wylene Simmer, MD;  Location: [place];  Service: Orthopedics;  Laterality: Right;   APPLICATION OF WOUND VAC  07/02/2011   Procedure: APPLICATION OF WOUND VAC;  Surgeon: Wylene Simmer, MD;  Location: [place];  Service: Orthopedics;  Laterality: Right;   BRAIN SURGERY  1980's   Previous brain surgery in [place], [place]  2004; 2006   COLONOSCOPY N/A 09/17/2015   Procedure: COLONOSCOPY;  Surgeon: Wilford Corner, MD;  Location: [facility] ENDOSCOPY;  Service:  Endoscopy;  Laterality: N/A;   ESOPHAGOGASTRODUODENOSCOPY N/A 09/17/2015   Procedure: ESOPHAGOGASTRODUODENOSCOPY (EGD);  Surgeon: Wilford Corner, MD;  Location: [facility] ENDOSCOPY;  Service: Endoscopy;  Laterality: N/A;   I & D EXTREMITY  11/24/2010   Procedure: IRRIGATION AND DEBRIDEMENT EXTREMITY;  Surgeon: Wylene Simmer, MD;  Location: [place];  Service: Orthopedics;  Laterality: Left;  Debriedment of left plantar ulcer and trimming of toenails   I & D EXTREMITY  07/02/2011   Procedure: IRRIGATION AND DEBRIDEMENT EXTREMITY;  Surgeon: Wylene Simmer, MD;  Location: [place];  Service: Orthopedics;  Laterality: Left;  I & D of Left Foot   I & D EXTREMITY  07/06/2011   Procedure: IRRIGATION AND DEBRIDEMENT EXTREMITY;  Surgeon: Wylene Simmer, MD;  Location: [place];  Service: Orthopedics;  Laterality: Right;  IRRIGATION/DEBRIDEMENT RIGHT BELOW KNEE AMPUTATION   TUBAL LIGATION  2006  :   Current Facility-Administered Medications:    0.9 %  sodium chloride infusion, 10 mL/hr, Intravenous, Once, Opyd, Timothy S, MD   0.9 %  sodium chloride infusion, 250 mL, Intravenous, PRN, Opyd, Ilene Qua, MD   acetaminophen (TYLENOL) tablet 650 mg, 650 mg, Oral, Q4H PRN, Opyd, Ilene Qua, MD   [START ON 09/13/2020] dexamethasone (DECADRON) injection 40 mg, 40 mg, Intravenous, Q24H, Opyd, Ilene Qua, MD   folic acid (FOLVITE) tablet 2 mg, 2 mg, Oral, Daily, Anniemae Haberkorn, Rudell Cobb, MD   furosemide (LASIX) injection 20 mg, 20 mg, Intravenous, Once, Opyd, Ilene Qua, MD   [START ON 09/13/2020] furosemide (LASIX) injection 40 mg, 40 mg, Intravenous, Q12H, Opyd, Ilene Qua, MD   [START ON 09/13/2020] Immune Globulin 10% (PRIVIGEN) IV infusion 1 g/kg, 1 g/kg, Intravenous, Q24 Hr x 2, Morrill Bomkamp, Rudell Cobb, MD   [START ON 09/13/2020] insulin aspart (novoLOG) injection 0-6 Units, 0-6 Units, Subcutaneous, TID WC, Opyd, Ilene Qua, MD   ondansetron (ZOFRAN) injection 4 mg, 4 mg, Intravenous, Q6H PRN, Opyd, Ilene Qua, MD   [START ON 09/13/2020] pantoprazole  (PROTONIX) injection 40 mg, 40 mg, Intravenous, Q24H, Opyd, Ilene Qua, MD  Current Outpatient Medications:    amLODipine (NORVASC) 5 MG tablet, Take 1 tablet (5 mg total) by mouth daily., Disp: 90 tablet, Rfl: 3   aspirin EC 81 MG tablet, Take 81 mg by mouth daily as needed for moderate pain. Swallow whole., Disp: , Rfl:    Blood Glucose Monitoring Suppl (PRODIGY AUTOCODE BLOOD GLUCOSE) w/Device KIT, 1 kit by Does not apply route in the morning, at noon, in the evening, and at bedtime., Disp: 1 kit, Rfl: 0   ferrous gluconate (FERGON) 324 MG tablet, Take 1 tablet (324 mg total) by mouth daily with breakfast., Disp: 30 tablet, Rfl: 3   furosemide (LASIX) 40 MG tablet, Take 160 mg  by mouth daily., Disp: , Rfl:    gabapentin (NEURONTIN) 400 MG capsule, Take 1 capsule (400 mg total) by mouth 2 (two) times daily., Disp: 180 capsule, Rfl: 3   isosorbide mononitrate (IMDUR) 30 MG 24 hr tablet, Take 1 tablet (30 mg total) by mouth daily., Disp: 90 tablet, Rfl: 3   metoprolol succinate (TOPROL-XL) 50 MG 24 hr tablet, Take 1 tablet (50 mg total) by mouth daily. Take with or immediately following a meal., Disp: 90 tablet, Rfl: 3   polyvinyl alcohol (LIQUIFILM TEARS) 1.4 % ophthalmic solution, Place 1 drop into both eyes as needed for dry eyes., Disp: , Rfl:    rosuvastatin (CRESTOR) 20 MG tablet, Take 1 tablet (20 mg total) by mouth daily., Disp: 90 tablet, Rfl: 3:   [START ON 09/13/2020] dexamethasone (DECADRON) injection  40 mg Intravenous T73U   folic acid  2 mg Oral Daily   furosemide  20 mg Intravenous Once   [START ON 09/13/2020] furosemide  40 mg Intravenous Q12H   [START ON 09/13/2020] insulin aspart  0-6 Units Subcutaneous TID WC   [START ON 09/13/2020] pantoprazole (PROTONIX) IV  40 mg Intravenous Q24H  :  No Known Allergies:   Family History  Problem Relation Age of Onset   Diabetes Mother    Hypertension Mother    Hyperlipidemia Mother    Diabetes Father    Cancer Father        prostate    Hyperlipidemia Father    Hypertension Father    Diabetes Brother    Peripheral Artery Disease Brother        has had toes amputated   Diabetes Son   :   Social History   Socioeconomic History   Marital status: Widowed    Spouse name: Not on file   Number of children: Not on file   Years of education: Not on file   Highest education level: Not on file  Occupational History   Not on file  Tobacco Use   Smoking status: Never   Smokeless tobacco: Never  Vaping Use   Vaping Use: Never used  Substance and Sexual Activity   Alcohol use: Yes    Alcohol/week: 2.0 standard drinks    Types: 2 Glasses of wine per week   Drug use: No   Sexual activity: Not Currently    Birth control/protection: Post-menopausal  Other Topics Concern   Not on file  Social History Narrative   Widowed in 2012   Lives in a home she owns   No wheelchair ramp, though garage entry is flush to both garage and entry floor.   Unable to use full bathroom upstairs.   Has prosthesis for right LE, but never learned how to use.   Cannot see well due to cataracts.   Did not receive disability in 02/252 with application.   Possibly due to husband's SS she receives   Social Determinants of Radio broadcast assistant Strain: Not on file  Food Insecurity: Not on file  Transportation Needs: Not on file  Physical Activity: Not on file  Stress: Not on file  Social Connections: Not on file  Intimate Partner Violence: Not on file  :  Review of Systems  Constitutional:  Positive for malaise/fatigue.  HENT: Negative.    Eyes: Negative.   Respiratory:  Positive for shortness of breath.   Cardiovascular:  Positive for orthopnea and leg swelling.  Gastrointestinal:  Positive for blood in stool.  Genitourinary: Negative.   Musculoskeletal:  Positive for joint pain.  Skin: Negative.   Neurological:  Positive for weakness.  Endo/Heme/Allergies:  Bruises/bleeds easily.  Psychiatric/Behavioral: Negative.       Exam: Patient Vitals for the past 24 hrs:  BP Temp Temp src Pulse Resp SpO2  09/12/20 2025 131/66 97.8 F (36.6 C) Oral 66 15 --  09/12/20 1830 125/78 -- -- 66 20 100 %  09/12/20 1803 131/66 98 F (36.7 C) Oral 66 18 100 %  09/12/20 1523 (!) 133/56 97.8 F (36.6 C) Oral 67 17 99 %   This is an elderly appearing African-American female.  She is alert and oriented.  Her vital signs are temperature 97.8.  Pulse 66.  Blood pressure 131/66.  Her head neck exam shows no scleral icterus.  She has no oral lesions.  There is no adenopathy in the neck.  Lungs are decreased bilaterally.  She has wheezes bilaterally.  Cardiac exam tachycardic but regular.  Abdomen is soft but obese.  There is no obvious fluid wave.  There is no obvious splenomegaly.  Extremity shows the right BKA.  She has some edema and chronic skin changes in the left leg.  Neurological exam is nonfocal.  Skin exam shows numerous ecchymoses.   Recent Labs    09/12/20 1552 09/12/20 1910  WBC 2.8*  --   HGB 4.5* 5.4*  HCT 17.8* 16.0*  PLT <5*  --     Recent Labs    09/12/20 1552 09/12/20 1910  NA 134* 138  K 5.7* 5.8*  CL 108  --   CO2 19*  --   GLUCOSE 86  --   BUN 40*  --   CREATININE 1.83*  --   CALCIUM 7.9*  --     Blood smear review: Hypochromic and microcytic red blood cells.  There are couple schistocytes.  There is some polychromasia.  I see no nucleated red blood cells.  She has a couple teardrop cells.  There are no red cell inclusion bodies.  There is no rouleaux formation.  White blood cells are decreased in number.  She has no hypersegmented polys.  I do not see any immature myeloid or lymphoid cells.  Platelets are markedly decreased in number.  In fact, platelets appear to be absent.  Pathology: None    Assessment and Plan: [name] is a very nice 55 year old Afro-American female with another episode of relapsed ITP.  Again, this has to be secondary to her not having any medication for 3  months.  I see nothing on the blood smear that would suggest an underlying hematologic malignancy or any type of infiltrating bone marrow disorder.  Certainly, a splenectomy would not be a bad idea.  This does have a decent chance of getting into remission.  I know she is diabetic.  I do think that we are going to have to try her on some steroids.  We will just have to watch her blood sugars.  We will go ahead with IVIG.  I will give her 2 doses of IVIG.  We will also put her on Nplate.  We will make sure that there is no other coagulopathy.  We will check a PT and PTT.  I think folic acid would not be a bad idea for her.  I am sure she is iron deficient.  I would hold off on platelets right now since she is not obviously bleeding.  I would go ahead and give her 2 units of blood.  At some point,  we will have to get surgery involved and see if they would agree to a splenectomy with her.  I do not know if she has been vaccinated for pneumococcus/haemophilus/meningococcus.  She has never had Rituxan.  This also would be a possibility for her.  I just hate that she keeps having these relapses.  This just is not good quality of life for her.  She does seem very nice.  We will follow her along and help out any way that we can.   Lattie Haw,  MD  Darlyn Chamber 17:14

## 2020-09-12 NOTE — ED Triage Notes (Signed)
Pt c/o leg swelling x51mo, states hasn't been able to take furosemide for same time. Endorses hx CHF approx 53yrs ago, compliant w meds until recently ran out. Pt endorses SHOB on exertion, ordinarily able to perform ADLs (wheelchair- bed transfer, bathroom) independently, now requires help. States she has home O2 but hasn't had to use it recently.   10/10 tightness, pressure, sore in legs

## 2020-09-12 NOTE — ED Notes (Signed)
Verbal consent for blood obtained. Cord in room too short to reach pt in bed

## 2020-09-12 NOTE — ED Provider Notes (Signed)
Emergency Medicine Provider Triage Evaluation Note  [name] , a 55 y.o. female  was evaluated in triage.  Pt complains of leg pain and swelling.  She has had a unilateral BKA before however still feels like both of her legs are swelling and are now painful.  She states that this is to the point where she is unable to perform the ADLs that she used to be able to.  This has been worsening over a month.  Review of Systems  Positive: Leg pain and swelling Negative: fever  Physical Exam  BP (!) 133/56 (BP Location: Left Arm)   Pulse 67   Temp 97.8 F (36.6 C) (Oral)   Resp 17   LMP  (LMP Unknown)   SpO2 99%  Gen:   Awake, no distress   Resp:  Normal effort  MSK:   Moves extremities without difficulty, status post right BKA.  Left leg does appear edematous Other:  Speech is not slurred  Medical Decision Making  Medically screening exam initiated at 4:03 PM.  Appropriate orders placed.  [name] was informed that the remainder of the evaluation will be completed by another provider, this initial triage assessment does not replace that evaluation, and the importance of remaining in the ED until their evaluation is complete.  Note: Portions of this report may have been transcribed using voice recognition software. Every effort was made to ensure accuracy; however, inadvertent computerized transcription errors may be present    Norman Clay 09/12/20 1605    Cathren Laine, MD 09/12/20 [phone]

## 2020-09-12 NOTE — ED Provider Notes (Signed)
[facility] EMERGENCY DEPARTMENT Provider Note   CSN: [id number] Arrival date & time: 09/12/20  1516     History Chief Complaint  Patient presents with   Leg Swelling    [name] is a 55 y.o. female.  55 year old female history of ITP on IVIG, anemia, status post right-sided BKA, CHF on Lasix, presents to the ER secondary to lower extremity swelling, dyspnea.  Patient reports over the past week she has experienced worsening swelling to her lower extremities. Worsening difficulty breathing with minimal exertion.  Unable to get up from wheel chair 2/2 leg pain and severe dyspnea. She does report increased bruising to her arm. She has conjunctival hemorrhage to right eye without vision abnormalities. Nonproductive cough.  No chest pain.  Denies fevers or chills.  Patient has oxygen that she uses at home intermittently PRN. No recent travel or sick contacts.   Hx ITP, Heme/onc Dr Lindi Adie as o/p.  Patient with recent hospitalization 3 months ago with severe anemia, thrombocytopenia prior multiple transfusions, IVIG.  Unclear if she has been following with hematology as o/p  The history is provided by the patient. No language interpreter was used.  Shortness of Breath Severity:  Moderate Onset quality:  Gradual Timing:  Intermittent Progression:  Worsening Chronicity:  Chronic Context: activity   Associated symptoms: cough   Associated symptoms: no abdominal pain, no chest pain, no fever, no headaches, no rash and no vomiting       Past Medical History:  Diagnosis Date   Acute exacerbation of CHF (congestive heart failure) ([place]) 04/11/2019   Anemia    Arthritis    "spine, hands" (11/25/2015)   Back pain    "all my back; probably 3 times/week" (11/25/2015)   Chronic indwelling Foley catheter 03/04/2017   Diabetic foot ulcer associated with type 2 diabetes mellitus ([place])    Hypertension    Intracerebral hemorrhage ([place]) As a teenager    States she had  burst blood vessel as teenager, now with resultant minor visual field and hearing deficits   Neuropathy    Stroke ([place]) ~ 1982   "my feeling on my RLE, right lower eye vision,  & hearing out of my right ear not the same since" (11/25/2015)   Type II diabetes mellitus ([place])     Patient Active Problem List   Diagnosis Date Noted   Pancytopenia ([place]) 09/12/2020   Chronic ITP (idiopathic thrombocytopenia) ([place]) 03/08/2020   Acute on chronic respiratory failure with hypoxia ([place]) 03/08/2020   Cellulitis of foot 08/02/2019   Nausea and vomiting 06/26/2019   Hematuria 06/26/2019   Hypertensive urgency 06/26/2019   Acute on chronic diastolic CHF (congestive heart failure) (HCC)    CHF (congestive heart failure) ([place]) 04/11/2019   Acute exacerbation of CHF (congestive heart failure) ([place]) 04/11/2019   Leukopenia 04/11/2019   History of ITP 04/11/2019   Idiopathic thrombocytopenic purpura (ITP) ([place]) 11/16/2018   Onychomycosis of toenail 10/16/2018   Cataract of both eyes 10/16/2018   Tinea pedis of left foot 10/16/2018   Decreased vision in both eyes 10/16/2018   CKD (chronic kidney disease) stage 3, GFR 30-59 ml/min (HCC)    Cellulitis of left lower extremity 01/25/2018   Hyperkalemia 01/25/2018   Poor compliance 03/08/2017   Hypoxia 03/07/2017   Thyroid nodule 03/07/2017   Chronic indwelling Foley catheter 03/04/2017   Type II diabetes mellitus ([place])    Femur fracture ([place]) 03/02/2017   Orthostatic dizziness    Urinary tract infection without hematuria  Enterococcus faecalis infection    Near syncope 11/25/2015   Dehydration 11/25/2015   Orthostatic hypotension 11/25/2015   UTI (urinary tract infection) 11/25/2015   Acute worsening of stage 3 chronic kidney disease ([place]) 09/24/2015   Diabetes mellitus due to underlying condition with chronic kidney disease, without long-term current use of insulin (HCC)    Fever    Status post below knee amputation of right lower extremity  (HCC)    Diabetic peripheral neuropathy (HCC)    Neuropathic pain    Benign essential HTN    Folliculitis    Slow transit constipation    Anemia of chronic disease    Bilateral hydronephrosis    Urinary retention    CKD (chronic kidney disease)    Uncontrolled type 2 diabetes mellitus with diabetic nephropathy, without long-term current use of insulin ([place])    Vasculopathy    Debility 09/04/2015   DM type 2 with diabetic peripheral neuropathy (HCC)    S/P BKA (below knee amputation) unilateral (HCC)    Medical non-compliance    Benign skin lesion of multiple sites    Tachypnea    Acute blood loss anemia    Neurogenic bladder    Occult blood positive stool    Rectovaginal fistula    Controlled diabetes mellitus type 2 with complications ([place])    AKI (acute kidney injury) ([place])    Hydronephrosis determined by ultrasound    Papular rash    Uncontrolled type 2 diabetes mellitus with complication (HCC)    Paresthesia    Thrombocytopenia ([place]) 08/23/2015   Pressure ulcer 08/23/2015   Severe anemia 08/23/2015   Lower urinary tract infectious disease 08/23/2015   Symptomatic anemia 08/23/2015   Peripheral vascular disease, unspecified ([place]) 04/27/2012   Unilateral complete BKA ([place]) 07/09/2011   Gas gangrene ([place]) 07/02/2011   Sepsis(995.91) 07/02/2011   Acute kidney injury superimposed on chronic kidney disease ([place]) 07/02/2011   Diabetes mellitus ([place]) 11/22/2010   Diabetic foot ulcer associated with type 2 diabetes mellitus ([place])    Essential hypertension    Arthritis    Neuropathy ([place])    Intracerebral hemorrhage ([place])     Past Surgical History:  Procedure Laterality Date   AMPUTATION  11/24/2010   Procedure: AMPUTATION FOOT;  Surgeon: Wylene Simmer, MD;  Location: [facility];  Service: Orthopedics;  Laterality: Right;  Right  FOOT TRANS METATARSAL AMPUTATION   AMPUTATION  07/02/2011   Procedure: AMPUTATION BELOW KNEE;  Surgeon: Wylene Simmer, MD;  Location: [facility];  Service:  Orthopedics;  Laterality: Right;   APPLICATION OF WOUND VAC  07/02/2011   Procedure: APPLICATION OF WOUND VAC;  Surgeon: Wylene Simmer, MD;  Location: [facility];  Service: Orthopedics;  Laterality: Right;   BRAIN SURGERY  1980's   Previous brain surgery in [place], [place]  2004; 2006   COLONOSCOPY N/A 09/17/2015   Procedure: COLONOSCOPY;  Surgeon: Wilford Corner, MD;  Location: [facility] ENDOSCOPY;  Service: Endoscopy;  Laterality: N/A;   ESOPHAGOGASTRODUODENOSCOPY N/A 09/17/2015   Procedure: ESOPHAGOGASTRODUODENOSCOPY (EGD);  Surgeon: Wilford Corner, MD;  Location: [facility] ENDOSCOPY;  Service: Endoscopy;  Laterality: N/A;   I & D EXTREMITY  11/24/2010   Procedure: IRRIGATION AND DEBRIDEMENT EXTREMITY;  Surgeon: Wylene Simmer, MD;  Location: [facility];  Service: Orthopedics;  Laterality: Left;  Debriedment of left plantar ulcer and trimming of toenails   I & D EXTREMITY  07/02/2011   Procedure: IRRIGATION AND DEBRIDEMENT EXTREMITY;  Surgeon: Wylene Simmer, MD;  Location: [facility];  Service: Orthopedics;  Laterality: Left;  I & D of Left Foot   I & D EXTREMITY  07/06/2011   Procedure: IRRIGATION AND DEBRIDEMENT EXTREMITY;  Surgeon: Wylene Simmer, MD;  Location: [facility];  Service: Orthopedics;  Laterality: Right;  IRRIGATION/DEBRIDEMENT RIGHT BELOW KNEE AMPUTATION   TUBAL LIGATION  2006     OB History   No obstetric history on file.     Family History  Problem Relation Age of Onset   Diabetes Mother    Hypertension Mother    Hyperlipidemia Mother    Diabetes Father    Cancer Father        prostate   Hyperlipidemia Father    Hypertension Father    Diabetes Brother    Peripheral Artery Disease Brother        has had toes amputated   Diabetes Son     Social History   Tobacco Use   Smoking status: Never   Smokeless tobacco: Never  Vaping Use   Vaping Use: Never used  Substance Use Topics   Alcohol use: Yes    Alcohol/week: 2.0 standard drinks    Types: 2 Glasses of wine per week   Drug use:  No    Home Medications Prior to Admission medications   Medication Sig Start Date End Date Taking? Authorizing Provider  amLODipine (NORVASC) 5 MG tablet Take 1 tablet (5 mg total) by mouth daily. 01/28/20 01/27/21  Vevelyn Francois, NP  aspirin EC 81 MG tablet Take 81 mg by mouth daily as needed for moderate pain. Swallow whole.    [provider]  Blood Glucose Monitoring Suppl (PRODIGY AUTOCODE BLOOD GLUCOSE) w/Device KIT 1 kit by Does not apply route in the morning, at noon, in the evening, and at bedtime. 06/06/19 06/05/20  Vevelyn Francois, NP  ferrous gluconate (FERGON) 324 MG tablet Take 1 tablet (324 mg total) by mouth daily with breakfast. 06/16/20   Shawna Clamp, MD  furosemide (LASIX) 40 MG tablet Take 160 mg by mouth daily. 04/02/20   [provider]  gabapentin (NEURONTIN) 400 MG capsule Take 1 capsule (400 mg total) by mouth 2 (two) times daily. 01/28/20 01/27/21  Vevelyn Francois, NP  isosorbide mononitrate (IMDUR) 30 MG 24 hr tablet Take 1 tablet (30 mg total) by mouth daily. 01/28/20 01/22/21  Vevelyn Francois, NP  metoprolol succinate (TOPROL-XL) 50 MG 24 hr tablet Take 1 tablet (50 mg total) by mouth daily. Take with or immediately following a meal. 01/28/20 01/22/21  Vevelyn Francois, NP  polyvinyl alcohol (LIQUIFILM TEARS) 1.4 % ophthalmic solution Place 1 drop into both eyes as needed for dry eyes.    [provider]  rosuvastatin (CRESTOR) 20 MG tablet Take 1 tablet (20 mg total) by mouth daily. 01/28/20 01/22/21  Vevelyn Francois, NP    Allergies    Patient has no known allergies.  Review of Systems   Review of Systems  Constitutional:  Positive for fatigue. Negative for chills and fever.  HENT:  Negative for facial swelling and trouble swallowing.   Eyes:  Negative for photophobia and visual disturbance.  Respiratory:  Positive for cough and shortness of breath.   Cardiovascular:  Positive for leg swelling. Negative for chest pain and palpitations.   Gastrointestinal:  Negative for abdominal pain, nausea and vomiting.  Endocrine: Negative for polydipsia and polyuria.  Genitourinary:  Negative for difficulty urinating and hematuria.  Musculoskeletal:  Positive for arthralgias and gait problem. Negative for joint swelling.  Skin:  Negative for pallor and rash.  Neurological:  Negative for syncope and headaches.  Psychiatric/Behavioral:  Negative for agitation and confusion.    Physical Exam Updated Vital Signs BP 125/78   Pulse 66   Temp 98 F (36.7 C) (Oral)   Resp 20   LMP  (LMP Unknown)   SpO2 100%   Physical Exam Vitals and nursing note reviewed.  Constitutional:      General: She is in acute distress.     Appearance: Normal appearance. She is obese. She is ill-appearing.  HENT:     Head: Normocephalic and atraumatic.     Right Ear: External ear normal.     Left Ear: External ear normal.     Nose: Nose normal.     Mouth/Throat:     Mouth: Mucous membranes are moist.  Eyes:     General: Vision grossly intact. Gaze aligned appropriately. No scleral icterus.       Right eye: No discharge.        Left eye: No discharge.     Comments: Right-sided subconjunctival hemorrhage.   Cardiovascular:     Rate and Rhythm: Normal rate and regular rhythm.     Pulses: Normal pulses.     Heart sounds: Normal heart sounds.  Pulmonary:     Effort: Respiratory distress present. No tachypnea.     Breath sounds: Decreased breath sounds and rales present.  Abdominal:     General: Abdomen is flat.     Tenderness: There is no abdominal tenderness.  Musculoskeletal:        General: Normal range of motion.     Cervical back: Normal range of motion.     Right lower leg: No edema.     Left lower leg: No edema.  Skin:    General: Skin is warm and dry.     Capillary Refill: Capillary refill takes less than 2 seconds.       Neurological:     Mental Status: She is alert.  Psychiatric:        Mood and Affect: Mood normal.         Behavior: Behavior normal.    ED Results / Procedures / Treatments   Labs (all labs ordered are listed, but only abnormal results are displayed) Labs Reviewed  BASIC METABOLIC PANEL - Abnormal; Notable for the following components:      Result Value   Sodium 134 (*)    Potassium 5.7 (*)    CO2 19 (*)    BUN 40 (*)    Creatinine, Ser 1.83 (*)    Calcium 7.9 (*)    GFR, Estimated 33 (*)    All other components within normal limits  CBC - Abnormal; Notable for the following components:   WBC 2.8 (*)    RBC 2.11 (*)    Hemoglobin 4.5 (*)    HCT 17.8 (*)    MCH 21.3 (*)    MCHC 25.3 (*)    RDW 21.4 (*)    Platelets <5 (*)    nRBC 0.7 (*)    All other components within normal limits  I-STAT VENOUS BLOOD GAS, ED - Abnormal; Notable for the following components:   pCO2, Ven 43.5 (*)    pO2, Ven 150.0 (*)    Acid-base deficit 5.0 (*)    Potassium 5.8 (*)    Calcium, Ion 1.12 (*)    HCT 16.0 (*)    Hemoglobin 5.4 (*)    All other components  within normal limits  RESP PANEL BY RT-PCR (FLU A&B, COVID) ARPGX2  BRAIN NATRIURETIC PEPTIDE  FERRITIN  IRON AND TIBC  LACTATE DEHYDROGENASE  PATHOLOGIST SMEAR REVIEW  RETICULOCYTES  BASIC METABOLIC PANEL  TYPE AND SCREEN  PREPARE RBC (CROSSMATCH)  PREPARE PLATELET PHERESIS  TROPONIN I (HIGH SENSITIVITY)  TROPONIN I (HIGH SENSITIVITY)    EKG None  Radiology DG Chest 2 View  Result Date: 09/12/2020 CLINICAL DATA:  CHF, leg swelling EXAM: CHEST - 2 VIEW COMPARISON:  06/12/2020 FINDINGS: Cardiomegaly. Mild, diffuse bilateral interstitial pulmonary opacity and small, layering bilateral pleural effusions. The visualized skeletal structures are unremarkable. IMPRESSION: Cardiomegaly with mild, diffuse bilateral interstitial pulmonary opacity and small, layering bilateral pleural effusions, findings most consistent with edema. Electronically Signed   By: Eddie Candle M.D.   On: 09/12/2020 16:47    Procedures .Critical Care Performed  by: Jeanell Sparrow, DO Authorized by: Jeanell Sparrow, DO   Critical care provider statement:    Critical care time (minutes):  45   Critical care time was exclusive of:  Separately billable procedures and treating other patients   Critical care was necessary to treat or prevent imminent or life-threatening deterioration of the following conditions:  Respiratory failure and circulatory failure   Critical care was time spent personally by me on the following activities:  Discussions with consultants, evaluation of patient's response to treatment, examination of patient, ordering and performing treatments and interventions, ordering and review of laboratory studies, ordering and review of radiographic studies, pulse oximetry, re-evaluation of patient's condition, obtaining history from patient or surrogate and review of old charts   Care discussed with: admitting provider     Medications Ordered in ED Medications  0.9 %  sodium chloride infusion (has no administration in time range)  dexamethasone (DECADRON) injection 40 mg (has no administration in time range)  0.9 %  sodium chloride infusion (has no administration in time range)  acetaminophen (TYLENOL) tablet 650 mg (has no administration in time range)  ondansetron (ZOFRAN) injection 4 mg (has no administration in time range)  insulin aspart (novoLOG) injection 0-6 Units (has no administration in time range)  furosemide (LASIX) injection 40 mg (has no administration in time range)  furosemide (LASIX) injection 20 mg (has no administration in time range)  acetaminophen (TYLENOL) tablet 650 mg (has no administration in time range)  diphenhydrAMINE (BENADRYL) capsule 25 mg (has no administration in time range)  furosemide (LASIX) injection 40 mg (40 mg Intravenous Given 09/12/20 1954)  sodium zirconium cyclosilicate (LOKELMA) packet 10 g (10 g Oral Given 09/12/20 1955)  pantoprazole (PROTONIX) injection 40 mg (40 mg Intravenous Given 09/12/20 1954)     ED Course  I have reviewed the triage vital signs and the nursing notes.  Pertinent labs & imaging results that were available during my care of the patient were reviewed by me and considered in my medical decision making (see chart for details).    MDM Rules/Calculators/A&P                          This patient complains of fatigue, dyspnea, leg swelling; this involves an extensive number of treatment Options and is a complaint that carries with it a high risk of complications and Morbidity.  Upon my initial assessment patient hypoxic pulse ox 75 to 80%.  Placed on 4 L nasal cannula with improvement to respiratory status.  Vital signs otherwise stable.  Serious etiologies considered.    Labs reviewed,  patient with critical anemia at 4.5.  Thrombocytopenia, she is pancytopenic.  No recent head trauma.  Neurologic exam is nonfocal.  Patient denies any overt bleeding.  Patient with critical anemia.  Consented for 2 units PRBCs.  1 unit platelets given severe thrombocytopenia.    Discussed with hematology.  Agree with blood transfusion.  Begin Decadron, Protonix.  Peripheral smear.   Patient with CKD.  Creatinine mildly worsened from baseline.  Potassium is also elevated.  Patient given Lokelma.  No acute EKG changes.  Patient also with acute CHF exacerbation.  Started on IV diuresis.  She is requiring supple oxygen this time, not requiring BiPAP.  Low threshold for initiating BiPAP if respiratory decompensation.  Chest x-ray does show pulmonary edema. Continue with diuresis.    D/w admitting team who accepts the patient for admission.   Final Clinical Impression(s) / ED Diagnoses Final diagnoses:  Leg edema  Acute on chronic congestive heart failure, unspecified heart failure type (HCC)  Thrombocytopenia (HCC)  Symptomatic anemia  Hypoxia  Hyperkalemia    Rx / DC Orders ED Discharge Orders     None        Jeanell Sparrow, DO 09/12/20 1955

## 2020-09-13 ENCOUNTER — Other Ambulatory Visit: Payer: Self-pay

## 2020-09-13 DIAGNOSIS — N189 Chronic kidney disease, unspecified: Secondary | ICD-10-CM

## 2020-09-13 DIAGNOSIS — N1832 Chronic kidney disease, stage 3b: Secondary | ICD-10-CM | POA: Diagnosis not present

## 2020-09-13 DIAGNOSIS — E875 Hyperkalemia: Secondary | ICD-10-CM | POA: Diagnosis not present

## 2020-09-13 DIAGNOSIS — E611 Iron deficiency: Secondary | ICD-10-CM

## 2020-09-13 DIAGNOSIS — I5033 Acute on chronic diastolic (congestive) heart failure: Secondary | ICD-10-CM | POA: Diagnosis not present

## 2020-09-13 DIAGNOSIS — D693 Immune thrombocytopenic purpura: Secondary | ICD-10-CM | POA: Diagnosis not present

## 2020-09-13 DIAGNOSIS — E1122 Type 2 diabetes mellitus with diabetic chronic kidney disease: Secondary | ICD-10-CM | POA: Diagnosis not present

## 2020-09-13 DIAGNOSIS — D649 Anemia, unspecified: Secondary | ICD-10-CM | POA: Diagnosis not present

## 2020-09-13 LAB — BASIC METABOLIC PANEL
Anion gap: 9 (ref 5–15)
BUN: 43 mg/dL — ABNORMAL HIGH (ref 6–20)
CO2: 17 mmol/L — ABNORMAL LOW (ref 22–32)
Calcium: 8.1 mg/dL — ABNORMAL LOW (ref 8.9–10.3)
Chloride: 109 mmol/L (ref 98–111)
Creatinine, Ser: 1.74 mg/dL — ABNORMAL HIGH (ref 0.44–1.00)
GFR, Estimated: 35 mL/min — ABNORMAL LOW (ref >60)
Glucose, Bld: 125 mg/dL — ABNORMAL HIGH (ref 70–99)
Potassium: 6 mmol/L — ABNORMAL HIGH (ref 3.5–5.1)
Sodium: 135 mmol/L (ref 135–145)

## 2020-09-13 LAB — BPAM PLATELET PHERESIS
Blood Product Expiration Date: 202209102359
Unit Type and Rh: 600

## 2020-09-13 LAB — PREPARE PLATELET PHERESIS: Unit division: 0

## 2020-09-13 LAB — PREPARE RBC (CROSSMATCH)

## 2020-09-13 LAB — CBC
HCT: 21 % — ABNORMAL LOW (ref 36.0–46.0)
Hemoglobin: 5.8 g/dL — CL (ref 12.0–15.0)
MCH: 22.7 pg — ABNORMAL LOW (ref 26.0–34.0)
MCHC: 27.6 g/dL — ABNORMAL LOW (ref 30.0–36.0)
MCV: 82 fL (ref 80.0–100.0)
Platelets: 6 10*3/uL — CL (ref 150–400)
RBC: 2.56 MIL/uL — ABNORMAL LOW (ref 3.87–5.11)
RDW: 20.1 % — ABNORMAL HIGH (ref 11.5–15.5)
WBC: 3.5 10*3/uL — ABNORMAL LOW (ref 4.0–10.5)
nRBC: 1.1 % — ABNORMAL HIGH (ref 0.0–0.2)

## 2020-09-13 LAB — GLUCOSE, CAPILLARY
Glucose-Capillary: 141 mg/dL — ABNORMAL HIGH (ref 70–99)
Glucose-Capillary: 228 mg/dL — ABNORMAL HIGH (ref 70–99)
Glucose-Capillary: 261 mg/dL — ABNORMAL HIGH (ref 70–99)
Glucose-Capillary: 301 mg/dL — ABNORMAL HIGH (ref 70–99)

## 2020-09-13 LAB — RETICULOCYTES
Immature Retic Fract: 42.2 % — ABNORMAL HIGH (ref 2.3–15.9)
RBC.: 2.64 MIL/uL — ABNORMAL LOW (ref 3.87–5.11)
Retic Count, Absolute: 87.9 10*3/uL (ref 19.0–186.0)
Retic Ct Pct: 3.3 % — ABNORMAL HIGH (ref 0.4–3.1)

## 2020-09-13 LAB — TROPONIN I (HIGH SENSITIVITY): Troponin I (High Sensitivity): 7 ng/L (ref <18)

## 2020-09-13 LAB — LACTATE DEHYDROGENASE: LDH: 185 U/L (ref 98–192)

## 2020-09-13 LAB — PROTIME-INR
INR: 1.3 — ABNORMAL HIGH (ref 0.8–1.2)
Prothrombin Time: 16.6 seconds — ABNORMAL HIGH (ref 11.4–15.2)

## 2020-09-13 LAB — APTT: aPTT: 35 seconds (ref 24–36)

## 2020-09-13 MED ORDER — AMLODIPINE BESYLATE 5 MG PO TABS
5.0000 mg | ORAL_TABLET | Freq: Every day | ORAL | Status: DC
  Administered 2020-09-13 – 2020-09-16 (×4): 5 mg via ORAL
  Filled 2020-09-13 (×4): qty 1

## 2020-09-13 MED ORDER — INSULIN ASPART 100 UNIT/ML IJ SOLN
6.0000 [IU] | Freq: Once | INTRAMUSCULAR | Status: AC
  Administered 2020-09-13: 6 [IU] via SUBCUTANEOUS

## 2020-09-13 MED ORDER — ISOSORBIDE MONONITRATE ER 30 MG PO TB24
30.0000 mg | ORAL_TABLET | Freq: Every day | ORAL | Status: DC
  Administered 2020-09-13 – 2020-09-21 (×9): 30 mg via ORAL
  Filled 2020-09-13 (×9): qty 1

## 2020-09-13 MED ORDER — SODIUM CHLORIDE 0.9% IV SOLUTION: Freq: Once | INTRAVENOUS | Status: AC

## 2020-09-13 MED ORDER — SODIUM CHLORIDE 0.9 % IV SOLN
40.0000 mg | Freq: Every day | INTRAVENOUS | Status: DC
  Administered 2020-09-13: 40 mg via INTRAVENOUS
  Filled 2020-09-13 (×2): qty 4

## 2020-09-13 MED ORDER — GABAPENTIN 400 MG PO CAPS
400.0000 mg | ORAL_CAPSULE | Freq: Two times a day (BID) | ORAL | Status: DC
  Administered 2020-09-13 – 2020-10-02 (×38): 400 mg via ORAL
  Filled 2020-09-13 (×38): qty 1

## 2020-09-13 MED ORDER — ROMIPLOSTIM 250 MCG ~~LOC~~ SOLR
2.0000 ug/kg | Freq: Once | SUBCUTANEOUS | Status: AC
  Administered 2020-09-13: 225 ug via SUBCUTANEOUS
  Filled 2020-09-13: qty 0.45

## 2020-09-13 MED ORDER — SODIUM ZIRCONIUM CYCLOSILICATE 10 G PO PACK
10.0000 g | PACK | Freq: Two times a day (BID) | ORAL | Status: AC
  Administered 2020-09-13 – 2020-09-14 (×3): 10 g via ORAL
  Filled 2020-09-13 (×3): qty 1

## 2020-09-13 MED ORDER — FUROSEMIDE 10 MG/ML IJ SOLN
40.0000 mg | Freq: Once | INTRAMUSCULAR | Status: AC
  Administered 2020-09-13: 40 mg via INTRAVENOUS

## 2020-09-13 MED ORDER — SODIUM CHLORIDE 0.9 % IV SOLN
510.0000 mg | Freq: Once | INTRAVENOUS | Status: AC
  Administered 2020-09-13: 510 mg via INTRAVENOUS
  Filled 2020-09-13: qty 17

## 2020-09-13 MED ORDER — METOPROLOL SUCCINATE ER 50 MG PO TB24
50.0000 mg | ORAL_TABLET | Freq: Every day | ORAL | Status: DC
  Administered 2020-09-13 – 2020-09-20 (×8): 50 mg via ORAL
  Filled 2020-09-13 (×8): qty 1

## 2020-09-13 MED ORDER — DEXAMETHASONE SODIUM PHOSPHATE 4 MG/ML IJ SOLN
40.0000 mg | INTRAMUSCULAR | Status: DC
  Filled 2020-09-13: qty 10

## 2020-09-13 MED ORDER — LOPERAMIDE HCL 2 MG PO CAPS
2.0000 mg | ORAL_CAPSULE | ORAL | Status: DC | PRN
  Administered 2020-09-13 – 2020-09-17 (×5): 2 mg via ORAL
  Filled 2020-09-13 (×5): qty 1

## 2020-09-13 MED ORDER — ROSUVASTATIN CALCIUM 20 MG PO TABS
20.0000 mg | ORAL_TABLET | Freq: Every day | ORAL | Status: DC
  Administered 2020-09-13 – 2020-10-02 (×20): 20 mg via ORAL
  Filled 2020-09-13 (×15): qty 1
  Filled 2020-09-13: qty 4
  Filled 2020-09-13 (×4): qty 1

## 2020-09-13 NOTE — Progress Notes (Signed)
Dr Myna Hidalgo notified of electropoietin is a send- out test and will not be able to get collected until Monday. Dr Tyson Babinski notified of 3rd brown watery stool, patient takes Immodium at home for chronic diarrhea.

## 2020-09-13 NOTE — Progress Notes (Signed)
PROGRESS NOTE  [name] [id number]:[id number] DOB: [birth year] DOA: 09/12/2020 PCP: Annita Brod, MD   LOS: 1 day   Brief narrative:  [name] is a 55 y.o. female with medical history significant for ITP, type 2 diabetes mellitus, chronic kidney disease, chronic diastolic CHF, chronic hypoxic respiratory failure, presented to the hospital with shortness of breath, fatigue, and bruising for one month and she is now unable to get up without assistance.  Patient stated that she was out of Lasix for 1 month.  Patient was supposed to be following up with hem-oncology but had been busy and was not able to follow-up with him.  In the ED, patient was hemodynamically stable but hemoglobin was 4.5, platelet less than 5.  WBC 2.8.  Patient had mild hyperkalemia with elevated creatinine of 1.8.  Chest x-ray showed cardiomegaly with small pleural effusion and pulmonary edema.  In the ED, patient was treated with IV Lasix, Protonix, dexamethasone, and Lokelma. 2 units PRBC were ordered and was admitted to the hospital.   Assessment/Plan:  Principal Problem:   Symptomatic anemia Active Problems:   Type II diabetes mellitus (HCC)   CKD (chronic kidney disease) stage 3, GFR 30-59 ml/min (HCC)   Idiopathic thrombocytopenic purpura (ITP) (HCC)   Acute on chronic diastolic CHF (congestive heart failure) (HCC)   Pancytopenia (HCC)  ITP; pancytopenia; symptomatic anemia  Patient is currently on high-dose IV steroids, Nplate and IVIG.  Oncology on board.  Received 2 units of packed RBC.  Patient might benefit from a splenectomy rituximab.  Management as per hematology .  Platelet count of less than 6 today.  Hemoglobin of 5.8 today.  We will transfuse 2 units of packed RBC with IV diuresis.  Hyperkalemia.  Received Lokelma yesterday.  We will repeat Lokelma today and tonight.  Check BMP in a.m.   Acute on chronic diastolic CHF with chronic hypoxic respiratory failure  Patient was out of her Lasix.  We  will continue 40 mg IV twice daily.  Continue strict intake and output charting, monitor electrolytes.  Monitor renal function.   CKD IIIb - SCr is 1.83 on admission, up from 1.51 in June 2022.  Creatinine of 1.7 today.   Type II DM  Hemoglobin A1c was 6.2% in June 2022.  Hold oral hypoglycemic agents, continue sliding scale insulin on steroids.  Debility, deconditioning, ambulatory dysfunction. States that she has DME wheelchair at home for ambulation we will get physical therapy evaluation prior to discharge.  DVT prophylaxis: SCDs Start: 09/12/20 1951  Code Status: Full code  Family Communication: None  Status is: Inpatient  Remains inpatient appropriate because:IV treatments appropriate due to intensity of illness or inability to take PO and Inpatient level of care appropriate due to severity of illness  Dispo: The patient is from: Home              Anticipated d/c is to: Home              Patient currently is not medically stable to d/c.   Difficult to place patient No  Consultants: Oncology  Procedures: PRBC transfusion  Anti-infectives:  None  Anti-infectives (From admission, onward)    None      Subjective: Today, patient was seen and examined at bedside. Couldn't sleep well, has wheelchair at home. No nausea, vomiting.  Denies bleeding today.   Objective: Vitals:   09/13/20 0602 09/13/20 0820  BP: (!) 151/67 (!) 150/61  Pulse: 69 73  Resp: 16 18  Temp: (!) 97.3 F (36.3 C) 98.6 F (37 C)  SpO2: 99% 100%    Intake/Output Summary (Last 24 hours) at 09/13/2020 0908 Last data filed at 09/13/2020 0856 Gross per 24 hour  Intake 1662.3 ml  Output 300 ml  Net 1362.3 ml   Filed Weights   09/12/20 2308 09/13/20 0153  Weight: 105.5 kg 112.4 kg   Body mass index is 38.81 kg/m.   Physical Exam: GENERAL: Patient is alert awake and oriented. Not in obvious distress.  Obese. HENT: No scleral pallor or icterus. Pupils equally reactive to light. Oral  mucosa is moist NECK: is supple, no gross swelling noted. CHEST: Clear to auscultation.   Diminished breath sounds bilaterally. CVS: S1 and S2 heard, no murmur. Regular rate and rhythm.  ABDOMEN: Soft, non-tender, bowel sounds are present. EXTREMITIES: Left lower extremity with gross edema.  Right below-knee amputation. CNS: Cranial nerves are intact. No focal motor deficits. SKIN: warm and dry without rashes.  Scattered ecchymosis.  Data Review: I have personally reviewed the following laboratory data and studies,  CBC: Recent Labs  Lab 09/12/20 1552 09/12/20 1910  WBC 2.8*  --   HGB 4.5* 5.4*  HCT 17.8* 16.0*  MCV 84.4  --   PLT <5*  --    Basic Metabolic Panel: Recent Labs  Lab 09/12/20 1552 09/12/20 1910 09/13/20 0355  NA 134* 138 135  K 5.7* 5.8* 6.0*  CL 108  --  109  CO2 19*  --  17*  GLUCOSE 86  --  125*  BUN 40*  --  43*  CREATININE 1.83*  --  1.74*  CALCIUM 7.9*  --  8.1*   Liver Function Tests: No results for input(s): AST, ALT, ALKPHOS, BILITOT, PROT, ALBUMIN in the last 168 hours. No results for input(s): LIPASE, AMYLASE in the last 168 hours. No results for input(s): AMMONIA in the last 168 hours. Cardiac Enzymes: No results for input(s): CKTOTAL, CKMB, CKMBINDEX, TROPONINI in the last 168 hours. BNP (last 3 results) Recent Labs    03/08/20 1309 06/12/20 0954 09/12/20 1830  BNP 824.4* 774.5* 1,139.8*    ProBNP (last 3 results) No results for input(s): PROBNP in the last 8760 hours.  CBG: Recent Labs  Lab 09/13/20 0546  GLUCAP 141*   Recent Results (from the past 240 hour(s))  Resp Panel by RT-PCR (Flu A&B, Covid) Nasopharyngeal Swab     Status: None   Collection Time: 09/12/20  6:30 PM   Specimen: Nasopharyngeal Swab; Nasopharyngeal(NP) swabs in vial transport medium  Result Value Ref Range Status   SARS Coronavirus 2 by RT PCR NEGATIVE NEGATIVE Final    Comment: (NOTE) SARS-CoV-2 target nucleic acids are NOT DETECTED.  The  SARS-CoV-2 RNA is generally detectable in upper respiratory specimens during the acute phase of infection. The lowest concentration of SARS-CoV-2 viral copies this assay can detect is 138 copies/mL. A negative result does not preclude SARS-Cov-2 infection and should not be used as the sole basis for treatment or other patient management decisions. A negative result may occur with  improper specimen collection/handling, submission of specimen other than nasopharyngeal swab, presence of viral mutation(s) within the areas targeted by this assay, and inadequate number of viral copies(<138 copies/mL). A negative result must be combined with clinical observations, patient history, and epidemiological information. The expected result is Negative.  Fact Sheet for Patients:  BloggerCourse.com  Fact Sheet for Healthcare Providers:  SeriousBroker.it  This test is no t yet approved or cleared by the [place]  States FDA and  has been authorized for detection and/or diagnosis of SARS-CoV-2 by FDA under an Emergency Use Authorization (EUA). This EUA will remain  in effect (meaning this test can be used) for the duration of the COVID-19 declaration under Section 564(b)(1) of the Act, 21 U.S.C.section 360bbb-3(b)(1), unless the authorization is terminated  or revoked sooner.       Influenza A by PCR NEGATIVE NEGATIVE Final   Influenza B by PCR NEGATIVE NEGATIVE Final    Comment: (NOTE) The Xpert Xpress SARS-CoV-2/FLU/RSV plus assay is intended as an aid in the diagnosis of influenza from Nasopharyngeal swab specimens and should not be used as a sole basis for treatment. Nasal washings and aspirates are unacceptable for Xpert Xpress SARS-CoV-2/FLU/RSV testing.  Fact Sheet for Patients: BloggerCourse.com  Fact Sheet for Healthcare Providers: SeriousBroker.it  This test is not yet approved or  cleared by the [place] FDA and has been authorized for detection and/or diagnosis of SARS-CoV-2 by FDA under an Emergency Use Authorization (EUA). This EUA will remain in effect (meaning this test can be used) for the duration of the COVID-19 declaration under Section 564(b)(1) of the Act, 21 U.S.C. section 360bbb-3(b)(1), unless the authorization is terminated or revoked.  Performed at [facility], [address] [place], [place] [address]      Studies: DG Chest 2 View  Result Date: 09/12/2020 CLINICAL DATA:  CHF, leg swelling EXAM: CHEST - 2 VIEW COMPARISON:  06/12/2020 FINDINGS: Cardiomegaly. Mild, diffuse bilateral interstitial pulmonary opacity and small, layering bilateral pleural effusions. The visualized skeletal structures are unremarkable. IMPRESSION: Cardiomegaly with mild, diffuse bilateral interstitial pulmonary opacity and small, layering bilateral pleural effusions, findings most consistent with edema. Electronically Signed   By: Lauralyn Primes M.D.   On: 09/12/2020 16:47      Joycelyn Das, MD  [facility] 09/13/2020  If 7PM-7AM, please contact night-coverage

## 2020-09-13 NOTE — Progress Notes (Signed)
[name] is doing a little bit better.  She is getting blood this morning.  Her hemoglobin is 5.8.  Her platelet count is up to 6000.  She got IVIG this morning.  She will another dose  tomorrow.  I ordered Nplate for her.  Her blood sugar is doing great.  As such, I will give her another dose of Decadron.  I will give her 3 more days of Decadron.  Again, I do think she is going need to have her spleen taken out.  I know this would not be easy given all of her health issues.  She is not bleeding.  She is clearly iron deficient.  I will order IV iron for her.  I will also order an erythropoietin level on her.  Given that she has renal insufficiency, we will have to see what her erythropoietin level is.  She may be candidate for ESA.  Her white cell count came up.  I am happy about this.  Her INR is slightly elevated.  I would just watch this for right now.  Her BUN is 43 creatinine 1.74.  She does have chronic renal insufficiency.  I suspect that she probably has renal tubular acidosis that is causing the hyperkalemia.  Hopefully, we will see a continued rise in her platelet count.  I will have to see if she has had vaccinations for her splenectomy.  Christin Bach, MD  Romans 8:28

## 2020-09-14 DIAGNOSIS — E611 Iron deficiency: Secondary | ICD-10-CM | POA: Diagnosis not present

## 2020-09-14 DIAGNOSIS — N1832 Chronic kidney disease, stage 3b: Secondary | ICD-10-CM | POA: Diagnosis not present

## 2020-09-14 DIAGNOSIS — D649 Anemia, unspecified: Secondary | ICD-10-CM | POA: Diagnosis not present

## 2020-09-14 DIAGNOSIS — I5033 Acute on chronic diastolic (congestive) heart failure: Secondary | ICD-10-CM | POA: Diagnosis not present

## 2020-09-14 DIAGNOSIS — D693 Immune thrombocytopenic purpura: Secondary | ICD-10-CM | POA: Diagnosis not present

## 2020-09-14 LAB — GLUCOSE, CAPILLARY
Glucose-Capillary: 273 mg/dL — ABNORMAL HIGH (ref 70–99)
Glucose-Capillary: 341 mg/dL — ABNORMAL HIGH (ref 70–99)
Glucose-Capillary: 300 mg/dL — ABNORMAL HIGH (ref 70–99)
Glucose-Capillary: 418 mg/dL — ABNORMAL HIGH (ref 70–99)

## 2020-09-14 LAB — BASIC METABOLIC PANEL
Anion gap: 11 (ref 5–15)
BUN: 47 mg/dL — ABNORMAL HIGH (ref 6–20)
CO2: 19 mmol/L — ABNORMAL LOW (ref 22–32)
Calcium: 7.8 mg/dL — ABNORMAL LOW (ref 8.9–10.3)
Chloride: 101 mmol/L (ref 98–111)
Creatinine, Ser: 1.6 mg/dL — ABNORMAL HIGH (ref 0.44–1.00)
GFR, Estimated: 38 mL/min — ABNORMAL LOW (ref >60)
Glucose, Bld: 307 mg/dL — ABNORMAL HIGH (ref 70–99)
Potassium: 4 mmol/L (ref 3.5–5.1)
Sodium: 131 mmol/L — ABNORMAL LOW (ref 135–145)

## 2020-09-14 LAB — CBC
HCT: 24 % — ABNORMAL LOW (ref 36.0–46.0)
Hemoglobin: 7.2 g/dL — ABNORMAL LOW (ref 12.0–15.0)
MCH: 24 pg — ABNORMAL LOW (ref 26.0–34.0)
MCHC: 30 g/dL (ref 30.0–36.0)
MCV: 80 fL (ref 80.0–100.0)
Platelets: 40 10*3/uL — ABNORMAL LOW (ref 150–400)
RBC: 3 MIL/uL — ABNORMAL LOW (ref 3.87–5.11)
RDW: 19.1 % — ABNORMAL HIGH (ref 11.5–15.5)
WBC: 2.8 10*3/uL — ABNORMAL LOW (ref 4.0–10.5)
nRBC: 1.1 % — ABNORMAL HIGH (ref 0.0–0.2)

## 2020-09-14 LAB — MAGNESIUM: Magnesium: 2 mg/dL (ref 1.7–2.4)

## 2020-09-14 LAB — PHOSPHORUS: Phosphorus: 4.4 mg/dL (ref 2.5–4.6)

## 2020-09-14 MED ORDER — SODIUM CHLORIDE 0.9 % IV SOLN
20.0000 mg | Freq: Every day | INTRAVENOUS | Status: AC
  Administered 2020-09-14 – 2020-09-15 (×2): 20 mg via INTRAVENOUS
  Filled 2020-09-14 (×2): qty 2

## 2020-09-14 MED ORDER — INSULIN ASPART 100 UNIT/ML IJ SOLN
10.0000 [IU] | Freq: Once | INTRAMUSCULAR | Status: AC
  Administered 2020-09-14: 10 [IU] via SUBCUTANEOUS

## 2020-09-14 MED ORDER — POLYVINYL ALCOHOL 1.4 % OP SOLN
1.0000 [drp] | OPHTHALMIC | Status: DC | PRN
  Filled 2020-09-14: qty 15

## 2020-09-14 NOTE — Progress Notes (Addendum)
PROGRESS NOTE  [name] IZT:[id number] DOB: 1965-10-31 DOA: 09/12/2020 PCP: Annita Brod, MD   LOS: 2 days   Brief narrative:  [name] is a 55 y.o. female with medical history significant for ITP, type 2 diabetes mellitus, chronic kidney disease, chronic diastolic CHF, chronic hypoxic respiratory failure, presented to the hospital with shortness of breath, fatigue, and bruising for one month and she is now unable to get up without assistance.  Patient stated that she was out of Lasix for 1 month.  Patient was supposed to be following up with hem-oncology but had been busy and was not able to follow-up with him.  In the ED, patient was hemodynamically stable but hemoglobin was 4.5, platelet less than 5.  WBC 2.8.  Patient had mild hyperkalemia with elevated creatinine of 1.8.  Chest x-ray showed cardiomegaly with small pleural effusion and pulmonary edema.  In the ED, patient was treated with IV Lasix, Protonix, dexamethasone, and Lokelma. 2 units PRBC were ordered and was admitted to the hospital.   Assessment/Plan:  Principal Problem:   Symptomatic anemia Active Problems:   Type II diabetes mellitus (HCC)   CKD (chronic kidney disease) stage 3, GFR 30-59 ml/min (HCC)   Idiopathic thrombocytopenic purpura (ITP) (HCC)   Acute on chronic diastolic CHF (congestive heart failure) (HCC)   Pancytopenia (HCC)  ITP; pancytopenia; symptomatic anemia  Patient is currently on high-dose IV steroids, received Nplate and IVIG.  Oncology on board.  Received 2 units of packed RBC during hospitalization..  Patient might benefit from a splenectomy as per oncology..  Platelet count of 40,000 today.  Oncology recommends DC home when platelet around 70,000.  Hemoglobin of 7.2 today after 2 units of packed RBC transfusion.    Hyperkalemia.  Potassium improved after Lokelma.  Latest potassium of 4.0   Acute on chronic diastolic CHF with chronic hypoxic respiratory failure  Patient was out of  her Lasix.  Currently on 40 mg IV twice daily.  Continue strict intake and output charting, monitor electrolytes.  Monitor renal function.   CKD IIIb - SCr is 1.83 on admission, up from 1.51 in June 2022.  Creatinine of 1.6 today.   Type II DM  Hemoglobin A1c was 6.2% in June 2022.  Continue to hold oral hypoglycemic agents, continue sliding scale insulin on steroids.  Debility, deconditioning, ambulatory dysfunction. States that she has DME wheelchair at home for ambulation, physical therapy evaluation prior to discharge. Lives with son at home.   DVT prophylaxis: SCDs Start: 09/12/20 1951  Code Status: Full code  Family Communication: None  Status is: Inpatient  Remains inpatient appropriate because:IV treatments appropriate due to intensity of illness or inability to take PO and Inpatient level of care appropriate due to severity of illness  Dispo: The patient is from: Home              Anticipated d/c is to: Home              Patient currently is not medically stable to d/c.   Difficult to place patient No  Consultants: Oncology  Procedures: PRBC transfusion III units.  Anti-infectives:  None  Anti-infectives (From admission, onward)    None      Subjective: Today, patient was seen and examined at bedside.  Patient feels better, no chest pain, shortness of breath. Uses prn oxygen at home. Denies pain. No nausea or vomiting. Had bowel moment. No bleeding externally.  Objective: Vitals:   09/14/20 0615 09/14/20 0827  BP: (!) 168/77 (!) 160/84  Pulse:  71  Resp: 15 17  Temp: (!) 97.5 F (36.4 C) 97.6 F (36.4 C)  SpO2: 99% 100%    Intake/Output Summary (Last 24 hours) at 09/14/2020 0900 Last data filed at 09/14/2020 0000 Gross per 24 hour  Intake 1156.67 ml  Output 1953 ml  Net -796.33 ml    Filed Weights   09/12/20 2308 09/13/20 0153 09/14/20 0615  Weight: 105.5 kg 112.4 kg 114 kg   Body mass index is 39.36 kg/m.   Physical Exam:  GENERAL:  Patient is alert awake and oriented. Not in obvious distress.  Obese. HENT: No scleral pallor or icterus. Pupils equally reactive to light. Oral mucosa is moist. Right eye redness.  NECK: is supple, no gross swelling noted. CHEST: Clear to auscultation.   Diminished breath sounds bilaterally. CVS: S1 and S2 heard, no murmur. Regular rate and rhythm.  ABDOMEN: Soft, non-tender, bowel sounds are present. EXTREMITIES: Left lower extremity with edema.  Right below-knee amputation. CNS: Cranial nerves are intact. No focal motor deficits. SKIN: warm and dry without rashes.  Scattered ecchymosis.  Data Review: I have personally reviewed the following laboratory data and studies,  CBC: Recent Labs  Lab 09/12/20 1552 09/12/20 1910 09/13/20 0833 09/14/20 0144  WBC 2.8*  --  3.5* 2.8*  HGB 4.5* 5.4* 5.8* 7.2*  HCT 17.8* 16.0* 21.0* 24.0*  MCV 84.4  --  82.0 80.0  PLT <5*  --  6* 40*    Basic Metabolic Panel: Recent Labs  Lab 09/12/20 1552 09/12/20 1910 09/13/20 0355 09/14/20 0144  NA 134* 138 135 131*  K 5.7* 5.8* 6.0* 4.0  CL 108  --  109 101  CO2 19*  --  17* 19*  GLUCOSE 86  --  125* 307*  BUN 40*  --  43* 47*  CREATININE 1.83*  --  1.74* 1.60*  CALCIUM 7.9*  --  8.1* 7.8*  MG  --   --   --  2.0  PHOS  --   --   --  4.4    Liver Function Tests: No results for input(s): AST, ALT, ALKPHOS, BILITOT, PROT, ALBUMIN in the last 168 hours. No results for input(s): LIPASE, AMYLASE in the last 168 hours. No results for input(s): AMMONIA in the last 168 hours. Cardiac Enzymes: No results for input(s): CKTOTAL, CKMB, CKMBINDEX, TROPONINI in the last 168 hours. BNP (last 3 results) Recent Labs    03/08/20 1309 06/12/20 0954 09/12/20 1830  BNP 824.4* 774.5* 1,139.8*     ProBNP (last 3 results) No results for input(s): PROBNP in the last 8760 hours.  CBG: Recent Labs  Lab 09/13/20 0546 09/13/20 1142 09/13/20 1615 09/13/20 2052 09/14/20 0555  GLUCAP 141* 228* 261*  301* 273*    Recent Results (from the past 240 hour(s))  Resp Panel by RT-PCR (Flu A&B, Covid) Nasopharyngeal Swab     Status: None   Collection Time: 09/12/20  6:30 PM   Specimen: Nasopharyngeal Swab; Nasopharyngeal(NP) swabs in vial transport medium  Result Value Ref Range Status   SARS Coronavirus 2 by RT PCR NEGATIVE NEGATIVE Final    Comment: (NOTE) SARS-CoV-2 target nucleic acids are NOT DETECTED.  The SARS-CoV-2 RNA is generally detectable in upper respiratory specimens during the acute phase of infection. The lowest concentration of SARS-CoV-2 viral copies this assay can detect is 138 copies/mL. A negative result does not preclude SARS-Cov-2 infection and should not be used as the sole basis for treatment  or other patient management decisions. A negative result may occur with  improper specimen collection/handling, submission of specimen other than nasopharyngeal swab, presence of viral mutation(s) within the areas targeted by this assay, and inadequate number of viral copies(<138 copies/mL). A negative result must be combined with clinical observations, patient history, and epidemiological information. The expected result is Negative.  Fact Sheet for Patients:  BloggerCourse.com  Fact Sheet for Healthcare Providers:  SeriousBroker.it  This test is no t yet approved or cleared by the [place] FDA and  has been authorized for detection and/or diagnosis of SARS-CoV-2 by FDA under an Emergency Use Authorization (EUA). This EUA will remain  in effect (meaning this test can be used) for the duration of the COVID-19 declaration under Section 564(b)(1) of the Act, 21 U.S.C.section 360bbb-3(b)(1), unless the authorization is terminated  or revoked sooner.       Influenza A by PCR NEGATIVE NEGATIVE Final   Influenza B by PCR NEGATIVE NEGATIVE Final    Comment: (NOTE) The Xpert Xpress SARS-CoV-2/FLU/RSV plus assay is  intended as an aid in the diagnosis of influenza from Nasopharyngeal swab specimens and should not be used as a sole basis for treatment. Nasal washings and aspirates are unacceptable for Xpert Xpress SARS-CoV-2/FLU/RSV testing.  Fact Sheet for Patients: BloggerCourse.com  Fact Sheet for Healthcare Providers: SeriousBroker.it  This test is not yet approved or cleared by the [place] FDA and has been authorized for detection and/or diagnosis of SARS-CoV-2 by FDA under an Emergency Use Authorization (EUA). This EUA will remain in effect (meaning this test can be used) for the duration of the COVID-19 declaration under Section 564(b)(1) of the Act, 21 U.S.C. section 360bbb-3(b)(1), unless the authorization is terminated or revoked.  Performed at [facility], [address], [place], [place] [address]       Studies: DG Chest 2 View  Result Date: 09/12/2020 CLINICAL DATA:  CHF, leg swelling EXAM: CHEST - 2 VIEW COMPARISON:  06/12/2020 FINDINGS: Cardiomegaly. Mild, diffuse bilateral interstitial pulmonary opacity and small, layering bilateral pleural effusions. The visualized skeletal structures are unremarkable. IMPRESSION: Cardiomegaly with mild, diffuse bilateral interstitial pulmonary opacity and small, layering bilateral pleural effusions, findings most consistent with edema. Electronically Signed   By: Lauralyn Primes M.D.   On: 09/12/2020 16:47      Joycelyn Das, MD  [facility] 09/14/2020  If 7PM-7AM, please contact night-coverage

## 2020-09-14 NOTE — Progress Notes (Signed)
I am not surprised that the platelet count is coming up nicely.  It is now 40,000.  I think the steroids clearly are helping.  Her blood sugars are on the high side now.  We may need to cut back on the Decadron a little bit.  I think she got Nplate yesterday.  She got her first dose of IVIG.  Her hemoglobin is 7.2.  I think she will get some iron today.  Her white cell count is on the low side at 2.8.  There is no bleeding.  That the real issue now is whether or not she will agree to a splenectomy.  Given the fact that she is responding to steroids should be a good indicator that a splenectomy would work for her.  She will need to have vaccinations prior to any splenectomy.  She will need to have the triple vaccine for pneumococcus/haemophilus/meningococcus.  I still would not let her go home yet.  I think that complaint can is get a lot higher.  I think was her platelet count gets above 70-80,000, then she can probably go home.  Then the problem might be whether or not she will follow-up.  I realize her blood sugars are on the high side.  She will need insulin.  Again I will cut back on the dose of Decadron.  I know that she is getting wonderful care from all the staff on [address].  Christin Bach, MD  Psalm 147:3

## 2020-09-14 NOTE — Plan of Care (Signed)
Patient progressing with encouragement

## 2020-09-15 ENCOUNTER — Inpatient Hospital Stay (HOSPITAL_COMMUNITY): Payer: Medicare Other

## 2020-09-15 ENCOUNTER — Encounter (HOSPITAL_COMMUNITY): Payer: Self-pay | Admitting: Family Medicine

## 2020-09-15 DIAGNOSIS — D693 Immune thrombocytopenic purpura: Secondary | ICD-10-CM | POA: Diagnosis not present

## 2020-09-15 DIAGNOSIS — D649 Anemia, unspecified: Secondary | ICD-10-CM | POA: Diagnosis not present

## 2020-09-15 DIAGNOSIS — I5033 Acute on chronic diastolic (congestive) heart failure: Secondary | ICD-10-CM | POA: Diagnosis not present

## 2020-09-15 DIAGNOSIS — N1832 Chronic kidney disease, stage 3b: Secondary | ICD-10-CM | POA: Diagnosis not present

## 2020-09-15 LAB — GLUCOSE, CAPILLARY
Glucose-Capillary: 337 mg/dL — ABNORMAL HIGH (ref 70–99)
Glucose-Capillary: 373 mg/dL — ABNORMAL HIGH (ref 70–99)
Glucose-Capillary: 376 mg/dL — ABNORMAL HIGH (ref 70–99)
Glucose-Capillary: 379 mg/dL — ABNORMAL HIGH (ref 70–99)
Glucose-Capillary: 433 mg/dL — ABNORMAL HIGH (ref 70–99)

## 2020-09-15 LAB — CBC
HCT: 24.8 % — ABNORMAL LOW (ref 36.0–46.0)
Hemoglobin: 7.5 g/dL — ABNORMAL LOW (ref 12.0–15.0)
MCH: 24.1 pg — ABNORMAL LOW (ref 26.0–34.0)
MCHC: 30.2 g/dL (ref 30.0–36.0)
MCV: 79.7 fL — ABNORMAL LOW (ref 80.0–100.0)
Platelets: 87 10*3/uL — ABNORMAL LOW (ref 150–400)
RBC: 3.11 MIL/uL — ABNORMAL LOW (ref 3.87–5.11)
RDW: 19.8 % — ABNORMAL HIGH (ref 11.5–15.5)
WBC: 3.4 10*3/uL — ABNORMAL LOW (ref 4.0–10.5)
nRBC: 0.9 % — ABNORMAL HIGH (ref 0.0–0.2)

## 2020-09-15 LAB — BASIC METABOLIC PANEL
Anion gap: 10 (ref 5–15)
BUN: 51 mg/dL — ABNORMAL HIGH (ref 6–20)
CO2: 22 mmol/L (ref 22–32)
Calcium: 8 mg/dL — ABNORMAL LOW (ref 8.9–10.3)
Chloride: 99 mmol/L (ref 98–111)
Creatinine, Ser: 1.44 mg/dL — ABNORMAL HIGH (ref 0.44–1.00)
GFR, Estimated: 43 mL/min — ABNORMAL LOW (ref >60)
Glucose, Bld: 381 mg/dL — ABNORMAL HIGH (ref 70–99)
Potassium: 3.2 mmol/L — ABNORMAL LOW (ref 3.5–5.1)
Sodium: 131 mmol/L — ABNORMAL LOW (ref 135–145)

## 2020-09-15 MED ORDER — PNEUMOCOCCAL 13-VAL CONJ VACC IM SUSP
0.5000 mL | Freq: Once | INTRAMUSCULAR | Status: AC
  Administered 2020-09-15: 0.5 mL via INTRAMUSCULAR
  Filled 2020-09-15: qty 0.5

## 2020-09-15 MED ORDER — PANTOPRAZOLE SODIUM 40 MG PO TBEC
40.0000 mg | DELAYED_RELEASE_TABLET | Freq: Every day | ORAL | Status: DC
  Administered 2020-09-16 – 2020-10-02 (×17): 40 mg via ORAL
  Filled 2020-09-15 (×17): qty 1

## 2020-09-15 MED ORDER — INSULIN ASPART 100 UNIT/ML IJ SOLN
8.0000 [IU] | Freq: Once | INTRAMUSCULAR | Status: AC
  Administered 2020-09-15: 8 [IU] via SUBCUTANEOUS

## 2020-09-15 MED ORDER — HAEMOPHILUS B POLYSAC CONJ VAC IM SOLR
0.5000 mL | Freq: Once | INTRAMUSCULAR | Status: AC
  Administered 2020-09-15: 0.5 mL via INTRAMUSCULAR
  Filled 2020-09-15: qty 0.5

## 2020-09-15 MED ORDER — SODIUM CHLORIDE 0.9 % IV SOLN
510.0000 mg | Freq: Once | INTRAVENOUS | Status: AC
  Administered 2020-09-16: 510 mg via INTRAVENOUS
  Filled 2020-09-15 (×2): qty 17

## 2020-09-15 MED ORDER — DARBEPOETIN ALFA 300 MCG/0.6ML IJ SOSY
300.0000 ug | PREFILLED_SYRINGE | Freq: Once | INTRAMUSCULAR | Status: AC
  Administered 2020-09-15: 300 ug via SUBCUTANEOUS
  Filled 2020-09-15: qty 0.6

## 2020-09-15 MED ORDER — POTASSIUM CHLORIDE CRYS ER 20 MEQ PO TBCR
20.0000 meq | EXTENDED_RELEASE_TABLET | Freq: Once | ORAL | Status: AC
  Administered 2020-09-15: 20 meq via ORAL
  Filled 2020-09-15: qty 1

## 2020-09-15 MED ORDER — MENINGOCOCCAL A C Y&W-135 OLIG IM SOLR
0.5000 mL | Freq: Once | INTRAMUSCULAR | Status: AC
  Administered 2020-09-15: 0.5 mL via INTRAMUSCULAR
  Filled 2020-09-15 (×2): qty 0.5

## 2020-09-15 NOTE — Progress Notes (Signed)
Inpatient Diabetes Program Recommendations  AACE/ADA: New Consensus Statement on Inpatient Glycemic Control (2015)  Target Ranges:  Prepandial:   less than 140 mg/dL      Peak postprandial:   less than 180 mg/dL (1-2 hours)      Critically ill patients:  140 - 180 mg/dL   Lab Results  Component Value Date   GLUCAP 379 (H) 09/15/2020   HGBA1C 6.2 (H) 06/13/2020    Review of Glycemic Control Results for ASIANA, BENNINGER (MRN [id number]) as of 09/15/2020 09:34  Ref. Range 09/14/2020 05:55 09/14/2020 11:38 09/14/2020 15:55 09/14/2020 21:49 09/15/2020 00:16 09/15/2020 06:08  Glucose-Capillary Latest Ref Range: 70 - 99 mg/dL 124 (H) 580 (H) 998 (H) 418 (H) 433 (H) 379 (H)   Diabetes history: DM2 Outpatient Diabetes medications: Metformin 500 mg bid Current orders for Inpatient glycemic control: Decadron 20 mg daily, Novolog 0-6 units tid  Inpatient Diabetes Program Recommendations:   While steroids continue @ current dosage: Consider: -Levemir 8 bid (0.15 units/kg x 110.9 kg = 16.6 kg) -Add Novolog 4-5 units tid meal coverage if eats 50% -Increase Novolog correction to 0-9 units tid + hs 0-5 units Secure chat sent to Dr. Tyson Babinski.  Thank you, Billy Fischer. Helga Asbury, RN, MSN, CDE  Diabetes Coordinator Inpatient Glycemic Control Team Team Pager [phone] (8am-5pm) 09/15/2020 9:49 AM

## 2020-09-15 NOTE — Progress Notes (Signed)
The platelet count is coming up quite nicely.  Today, the platelet count is 87,000.  Her white cell count is 3.4.  Hemoglobin is 7.5.  We will give her a dose of IV iron.  I am sure that her erythropoietin level is low.  She probably would benefit from a dose of Aranesp.  I talked to her again about having a splenectomy.  Splenectomy would work because she is responding well to steroids.  As such, I will see if surgery could see her.  A splenectomy can be done as an outpatient.  She is going to have to to have the triple vaccine.  We will see if pharmacy can administer this for her.  Her blood sugars are quite high.  I am sure this is all from the Decadron.  Hopefully, she will agree to a splenectomy.  I talked to her at length about this.  I told her that the chance of a splenectomy working and keeping her in remission would be about 70-80%.  I told her that its not a total guarantee but least it definitely improves her odds of the ITP not coming back and she not being back in the hospital.  I hope that she will agree to this.  She would like to talk to surgery and get some more information.  She does have little bit of leg swelling.  I am sure this is all from her anemia.  Her vital signs are all stable.  Blood pressure 156/81.  There really is no obvious change in her exam.  I do not see any obvious bleeding.  Bruises seem to be improving.  Again, she is responded very nicely to interventions.  I am sure that the steroids have helped quite a bit.  Unfortunately, they have brought her blood sugars up quite high.  As such, her diabetes again had to be under better control.  Again, see if surgery can talk to her about a splenectomy.  She will and MUST have the triple vaccine before she has a splenectomy.  I will think that if she undergoes a splenectomy, recovery certainly might be a little bit more tenuous because of the diabetes.  I know she is getting wonderful care from the compassionate  staff on [facility] [address].  Christin Bach, MD  [place]

## 2020-09-15 NOTE — Progress Notes (Signed)
Heart Failure Navigation Team Progress Note  PCP: Annita Brod, MD Primary Cardiologist: pending Admitted from: home  Past Medical History:  Diagnosis Date   Acute exacerbation of CHF (congestive heart failure) (HCC) 04/11/2019   Anemia    Arthritis    "spine, hands" (11/25/2015)   Back pain    "all my back; probably 3 times/week" (11/25/2015)   Chronic indwelling Foley catheter 03/04/2017   Diabetic foot ulcer associated with type 2 diabetes mellitus (HCC)    Hypertension    Intracerebral hemorrhage (HCC) As a teenager    States she had burst blood vessel as teenager, now with resultant minor visual field and hearing deficits   Neuropathy    Stroke (HCC) ~ 1982   "my feeling on my RLE, right lower eye vision,  & hearing out of my right ear not the same since" (11/25/2015)   Type II diabetes mellitus (HCC)     Social History   Socioeconomic History   Marital status: Widowed    Spouse name: Not on file   Number of children: 2   Years of education: Not on file   Highest education level: Some college, no degree  Occupational History   Occupation: disability  Tobacco Use   Smoking status: Never   Smokeless tobacco: Never  Vaping Use   Vaping Use: Never used  Substance and Sexual Activity   Alcohol use: Yes    Alcohol/week: 2.0 standard drinks    Types: 2 Glasses of wine per week   Drug use: No   Sexual activity: Not Currently    Birth control/protection: Post-menopausal  Other Topics Concern   Not on file  Social History Narrative   Widowed in 2012   Lives in a home she owns   No wheelchair ramp, though garage entry is flush to both garage and entry floor.   Unable to use full bathroom upstairs.   Has prosthesis for right LE, but never learned how to use.   Cannot see well due to cataracts.   Did not receive disability in 09/2017 with application.   Possibly due to husband's SS she receives   Social Determinants of Corporate investment banker Strain: Low Risk     Difficulty of Paying Living Expenses: Not very hard  Food Insecurity: No Food Insecurity   Worried About Programme researcher, broadcasting/film/video in the Last Year: Never true   Barista in the Last Year: Never true  Transportation Needs: Unmet Transportation Needs   Lack of Transportation (Medical): No   Lack of Transportation (Non-Medical): Yes  Physical Activity: Not on file  Stress: Not on file  Social Connections: Not on file     Heart & Vascular Transition of Care Clinic follow-up: Scheduled for pending.  Confirmed patient enrolled in [facility] transportation.  Immediate social needs: Transportation  CSW enrolled the patient in [employer].  Avion Kutzer, MSW, LCSWA [phone] Heart Failure Social Worker

## 2020-09-15 NOTE — Consult Note (Signed)
[name] 1965/01/20  [id number].    Requesting MD: Dr. Burney Gauze Chief Complaint/Reason for Consult: ITP, request for splenectomy  HPI:  This is a 55 yo black female with multiple medical problems including, CHF (EF 60-65%), DM, chronic wheelchair usage secondary to RLE BKA, HTN, H/O CVA, and DM, who was diagnosed with ITP apparently a couple of years ago and is followed by Dr. Lindi Adie.  She is somewhat of a poor historian in regards to treatment for this.  According to her chart, she was treated with IVIG as well as week Nplate tx.  She has had plts between <5 -10K for a while with easy bruising.  She has continued to have some bleeding intermittently as well as bruising.  She has recently noted an increase in LEs edema despite being on 67m of lasix daily.  She presented to the ED for evaluation of her legs actually.  She has been noted to still have thrombocytopenia.  Medical oncology has started the patient on steroids and her platelets have significantly improved on this up to 87K today.  The recommendation has been made for consideration of splenectomy and we have been consulted.  ROS: ROS: Please see HPI, otherwise all other systems have been reviewed and are currently negative.   Family History  Problem Relation Age of Onset   Diabetes Mother    Hypertension Mother    Hyperlipidemia Mother    Diabetes Father    Cancer Father        prostate   Hyperlipidemia Father    Hypertension Father    Diabetes Brother    Peripheral Artery Disease Brother        has had toes amputated   Diabetes Son     Past Medical History:  Diagnosis Date   Acute exacerbation of CHF (congestive heart failure) ([place] 04/11[place]   Anemia    Arthritis    "spine, hands" (11/25/2015)   Back pain    "all my back; probably 3 times/week" (11/25/2015)   Chronic indwelling Foley catheter 03/04/2017   Diabetic foot ulcer associated with type 2 diabetes mellitus ([facility]    Hypertension     [place]tracerebral hemorrhage ([place] a teenager    States she had burst blood vessel as teenager, now with resultant minor visual field and hearing deficits   Neuropathy    Stroke ([place] ~ 1982   "my feeling on my RLE, right lower eye vision,  & hearing out of my right ear not the same since" (11/25/2015)   Type II diabetes mellitus ([facility]     Past Surgical History:  Procedure Laterality Date   AMPUTATION  11/24/2010   Procedure: AMPUTATION FOOT;  Surgeon: JWylene Simmer MD;  [place] [facility]  Service: Orthopedics;  Laterality: Right;  Right  FOOT TRANS METATARSAL AMPUTATION   AMPUTATION  07/02/2011   Procedure: AMPUTATION BELOW KNEE;  Surgeon: JWylene Simmer MD;  [place] [facility]  Service: Orthopedics;  Laterality: Right;   APPLICATION OF WOUND VAC  07/02/2011   Procedure: APPLICATION OF WOUND VAC;  Surgeon: JWylene Simmer MD;  [place] [facility]  Service: Orthopedics;  Laterality: Right;   BRAIN SURGERY  1980's   [place]   COLONOSCOPY N/A 09/17/2015   Procedure: COLONOSCOPY;  Surgeon: VWilford Corner MD;  [facility]  Service: Endoscopy;  Laterality: N/A;   ESOPHAGOGASTRODUODENOSCOPY N/A 09/17/2015   Procedure: ESOPHAGOGASTRODUODENOSCOPY (EGD);  Surgeon: VWilford Corner MD;  [facility];  Service: Endoscopy;  Laterality: N/A;   I & D EXTREMITY  11/24/2010   Procedure: IRRIGATION AND DEBRIDEMENT EXTREMITY;  Surgeon: Wylene Simmer, MD;  Location: [place];  Service: Orthopedics;  Laterality: Left;  Debriedment of left plantar ulcer and trimming of toenails   I & D EXTREMITY  07/02/2011   Procedure: IRRIGATION AND DEBRIDEMENT EXTREMITY;  Surgeon: Wylene Simmer, MD;  Location: [place];  Service: Orthopedics;  Laterality: Left;  I & D of Left Foot   I & D EXTREMITY  07/06/2011   Procedure: IRRIGATION AND DEBRIDEMENT EXTREMITY;  Surgeon: Wylene Simmer, MD;  Location: [place];  Service: Orthopedics;  Laterality: Right;   IRRIGATION/DEBRIDEMENT RIGHT BELOW KNEE AMPUTATION   TUBAL LIGATION  2006    Social History:  reports that she has never smoked. She has never used smokeless tobacco. She reports current alcohol use of about 2.0 standard drinks per week. She reports that she does not use drugs.  Allergies: No Known Allergies  Medications Prior to Admission  Medication Sig Dispense Refill   amLODipine (NORVASC) 5 MG tablet Take 1 tablet (5 mg total) by mouth daily. 90 tablet 3   aspirin EC 81 MG tablet Take 81 mg by mouth daily as needed for moderate pain. Swallow whole.     furosemide (LASIX) 40 MG tablet Take 160 mg by mouth daily.     gabapentin (NEURONTIN) 400 MG capsule Take 1 capsule (400 mg total) by mouth 2 (two) times daily. 180 capsule 3   isosorbide mononitrate (IMDUR) 30 MG 24 hr tablet Take 1 tablet (30 mg total) by mouth daily. 90 tablet 3   metFORMIN (GLUCOPHAGE) 500 MG tablet Take 500 mg by mouth 2 (two) times daily.     metoprolol succinate (TOPROL-XL) 50 MG 24 hr tablet Take 1 tablet (50 mg total) by mouth daily. Take with or immediately following a meal. 90 tablet 3   rosuvastatin (CRESTOR) 20 MG tablet Take 1 tablet (20 mg total) by mouth daily. 90 tablet 3   Blood Glucose Monitoring Suppl (PRODIGY AUTOCODE BLOOD GLUCOSE) w/Device KIT 1 kit by Does not apply route in the morning, at noon, in the evening, and at bedtime. 1 kit 0   ferrous gluconate (FERGON) 324 MG tablet Take 1 tablet (324 mg total) by mouth daily with breakfast. (Patient not taking: No sig reported) 30 tablet 3   polyvinyl alcohol (LIQUIFILM TEARS) 1.4 % ophthalmic solution Place 1 drop into both eyes as needed for dry eyes. (Patient not taking: No sig reported)       Physical Exam: Blood pressure (!) 156/81, pulse 68, temperature 97.6 F (36.4 C), temperature source Oral, resp. rate 19, height $RemoveBe'5\' 7"'qWpclhrek$  (1.702 m), weight 110.9 kg, SpO2 95 %. General: pleasant, obese black female who is laying in bed in NAD HEENT: head is  normocephalic, atraumatic.  Sclera are noninjected.  PERRL.  Ears and nose without any masses or lesions.  Mouth is pink and moist Heart: regular, rate, and rhythm.  Normal s1,s2. No obvious murmurs, gallops, or rubs noted.  Palpable radial pulses bilaterally. Palpable left pedal pulse. Lungs: CTAB, no wheezes, rhonchi, or rales noted.  Respiratory effort nonlabored Abd: soft, NT, ND, +BS, no masses, hernias, or organomegaly, but difficult due to body habitus MS: all 4 extremities are symmetrical with no cyanosis, clubbing, or edema.  S/p RLE BKA with shrink wrap in place.  Some edema of her posterior R thigh noted Skin: warm and dry with no masses,  lesions, or rashes Neuro: Cranial nerves 2-12 grossly intact, sensation is normal throughout Psych: A&Ox3 with an appropriate affect.   Results for orders placed or performed during the hospital encounter of 09/12/20 (from the past 48 hour(s))  Glucose, capillary     Status: Abnormal   Collection Time: 09/13/20  4:15 PM  Result Value Ref Range   Glucose-Capillary 261 (H) 70 - 99 mg/dL    Comment: Glucose reference range applies only to samples taken after fasting for at least 8 hours.  Glucose, capillary     Status: Abnormal   Collection Time: 09/13/20  8:52 PM  Result Value Ref Range   Glucose-Capillary 301 (H) 70 - 99 mg/dL    Comment: Glucose reference range applies only to samples taken after fasting for at least 8 hours.   Comment 1 Notify RN    Comment 2 Document in Chart   Basic metabolic panel     Status: Abnormal   Collection Time: 09/14/20  1:44 AM  Result Value Ref Range   Sodium 131 (L) 135 - 145 mmol/L   Potassium 4.0 3.5 - 5.1 mmol/L   Chloride 101 98 - 111 mmol/L   CO2 19 (L) 22 - 32 mmol/L   Glucose, Bld 307 (H) 70 - 99 mg/dL    Comment: Glucose reference range applies only to samples taken after fasting for at least 8 hours.   BUN 47 (H) 6 - 20 mg/dL   Creatinine, Ser 1.60 (H) 0.44 - 1.00 mg/dL   Calcium 7.8 (L) 8.9 -  10.3 mg/dL   GFR, Estimated 38 (L) >60 mL/min    Comment: (NOTE) Calculated using the CKD-EPI Creatinine Equation (2021)    Anion gap 11 5 - 15    Comment: Performed at [facility] [address], [place], [place] [address]  Magnesium     Status: None   Collection Time: 09/14/20  1:44 AM  Result Value Ref Range   Magnesium 2.0 1.7 - 2.4 mg/dL    Comment: Performed at [facility] [address], [place], [place] [address]  Phosphorus     Status: None   Collection Time: 09/14/20  1:44 AM  Result Value Ref Range   Phosphorus 4.4 2.5 - 4.6 mg/dL    Comment: Performed at [facility] [address] [place], [place] [address]  CBC     Status: Abnormal   Collection Time: 09/14/20  1:44 AM  Result Value Ref Range   WBC 2.8 (L) 4.0 - 10.5 K/uL   RBC 3.00 (L) 3.87 - 5.11 MIL/uL   Hemoglobin 7.2 (L) 12.0 - 15.0 g/dL   HCT 24.0 (L) 36.0 - 46.0 %   MCV 80.0 80.0 - 100.0 fL   MCH 24.0 (L) 26.0 - 34.0 pg   MCHC 30.0 30.0 - 36.0 g/dL   RDW 19.1 (H) 11.5 - 15.5 %   Platelets 40 (L) 150 - 400 K/uL    Comment: Immature Platelet Fraction may be clinically indicated, consider ordering this additional test [id number] REPEATED TO VERIFY PLATELET COUNT CONFIRMED BY SMEAR    nRBC 1.1 (H) 0.0 - 0.2 %    Comment: Performed at [facility], [place] [address], [place], [place] [address]  Glucose, capillary     Status: Abnormal   Collection Time: 09/14/20  5:55 AM  Result Value Ref Range   Glucose-Capillary 273 (H) 70 - 99 mg/dL    Comment: Glucose reference range applies only to samples  taken after fasting for at least 8 hours.   Comment 1 Notify RN    Comment 2 Document in Chart   Glucose, capillary     Status: Abnormal   Collection Time: 09/14/20 11:38 AM  Result Value Ref Range   Glucose-Capillary 341 (H) 70 - 99 mg/dL    Comment: Glucose reference range applies only to samples taken after fasting for at least 8 hours.  Glucose, capillary     Status: Abnormal    Collection Time: 09/14/20  3:55 PM  Result Value Ref Range   Glucose-Capillary 300 (H) 70 - 99 mg/dL    Comment: Glucose reference range applies only to samples taken after fasting for at least 8 hours.  Glucose, capillary     Status: Abnormal   Collection Time: 09/14/20  9:49 PM  Result Value Ref Range   Glucose-Capillary 418 (H) 70 - 99 mg/dL    Comment: Glucose reference range applies only to samples taken after fasting for at least 8 hours.   Comment 1 Notify RN    Comment 2 Document in Chart   Glucose, capillary     Status: Abnormal   Collection Time: 09/15/20 12:16 AM  Result Value Ref Range   Glucose-Capillary 433 (H) 70 - 99 mg/dL    Comment: Glucose reference range applies only to samples taken after fasting for at least 8 hours.   Comment 1 Notify RN    Comment 2 Document in Chart   Basic metabolic panel     Status: Abnormal   Collection Time: 09/15/20  4:41 AM  Result Value Ref Range   Sodium 131 (L) 135 - 145 mmol/L   Potassium 3.2 (L) 3.5 - 5.1 mmol/L   Chloride 99 98 - 111 mmol/L   CO2 22 22 - 32 mmol/L   Glucose, Bld 381 (H) 70 - 99 mg/dL    Comment: Glucose reference range applies only to samples taken after fasting for at least 8 hours.   BUN 51 (H) 6 - 20 mg/dL   Creatinine, Ser 1.44 (H) 0.44 - 1.00 mg/dL   Calcium 8.0 (L) 8.9 - 10.3 mg/dL   GFR, Estimated 43 (L) >60 mL/min    Comment: (NOTE) Calculated using the CKD-EPI Creatinine Equation (2021)    Anion gap 10 5 - 15    Comment: Performed at [facility] [address] [place], [place] [address]  CBC     Status: Abnormal   Collection Time: 09/15/20  4:41 AM  Result Value Ref Range   WBC 3.4 (L) 4.0 - 10.5 K/uL   RBC 3.11 (L) 3.87 - 5.11 MIL/uL   Hemoglobin 7.5 (L) 12.0 - 15.0 g/dL   HCT 24.8 (L) 36.0 - 46.0 %   MCV 79.7 (L) 80.0 - 100.0 fL   MCH 24.1 (L) 26.0 - 34.0 pg   MCHC 30.2 30.0 - 36.0 g/dL   RDW 19.8 (H) 11.5 - 15.5 %   Platelets 87 (L) 150 - 400 K/uL    Comment: Immature Platelet  Fraction may be clinically indicated, consider ordering this additional test [id number] CONSISTENT WITH PREVIOUS RESULT REPEATED TO VERIFY    nRBC 0.9 (H) 0.0 - 0.2 %    Comment: Performed at [facility], [place] [address], [place], [place] [address]  Glucose, capillary     Status: Abnormal   Collection Time: 09/15/20  6:08 AM  Result Value Ref Range   Glucose-Capillary 379 (H) 70 - 99 mg/dL    Comment:  Glucose reference range applies only to samples taken after fasting for at least 8 hours.   Comment 1 Notify RN    Comment 2 Document in Chart   Glucose, capillary     Status: Abnormal   Collection Time: 09/15/20 10:33 AM  Result Value Ref Range   Glucose-Capillary 376 (H) 70 - 99 mg/dL    Comment: Glucose reference range applies only to samples taken after fasting for at least 8 hours.   No results found.    Assessment/Plan ITP/thrombocytopenia The patient has been treated for ITP with IVIG and Nplate for the last year at least by Dr. Lindi Adie.  She has continued to have significant thrombocytopenia with platelets around 5K.  She has been started on IV steroids here with a dramatic response up to 87K.  I will discuss this patient with Dr. Barry Dienes for further recommendations.  We discussed the procedure including risks and complications.  The patient is still unsure about how she would like to proceed.  I told her to think about it and to further discuss it with Dr. Barry Dienes when she comes by to see her.   FEN - HH/carb mod VTE - on hold due to thrombocytopenia ID - none currently  WC bound due to RLE BKA DM CHF HTN H/O CVA  Henreitta Cea, [facility] 09/15/2020, 11:55 AM Please see Amion for pager number during day hours 7:00am-4:30pm or 7:00am -11:30am on weekends

## 2020-09-15 NOTE — Progress Notes (Addendum)
PROGRESS NOTE  [name] VQM:[id number] DOB: June 14, 1965 DOA: 09/12/2020 PCP: Annita Brod, MD   LOS: 3 days   Brief narrative:  [name] is a 55 y.o. female with medical history significant for ITP, type 2 diabetes mellitus, chronic kidney disease, chronic diastolic CHF, chronic hypoxic respiratory failure, presented to the hospital with shortness of breath, fatigue, and bruising for one month and she is now unable to get up without assistance.  Patient stated that she was out of Lasix for 1 month.  Patient was supposed to be following up with hem-oncology but had been busy and was not able to follow-up with him.  In the ED, patient was hemodynamically stable but hemoglobin was 4.5, platelet less than 5.  WBC 2.8.  Patient had mild hyperkalemia with elevated creatinine of 1.8.  Chest x-ray showed cardiomegaly with small pleural effusion and pulmonary edema.  In the ED, patient was treated with IV Lasix, Protonix, dexamethasone, and Lokelma. 2 units PRBC were ordered and was admitted to the hospital.   Assessment/Plan:  Principal Problem:   Symptomatic anemia Active Problems:   Type II diabetes mellitus (HCC)   CKD (chronic kidney disease) stage 3, GFR 30-59 ml/min (HCC)   Idiopathic thrombocytopenic purpura (ITP) (HCC)   Acute on chronic diastolic CHF (congestive heart failure) (HCC)   Pancytopenia (HCC)  ITP; pancytopenia; symptomatic anemia  Patient is currently on high-dose IV steroids, received Nplate and IVIG.  Oncology on board.  Received 2 units of packed RBC during hospitalization..  Patient might benefit from a splenectomy as per oncology.  Platelet count of 87,000 today.  Spoke with general surgery regarding splenectomy.  Surgery will be following the patient today.  Hemoglobin of 7.5 today, patient will receive Aranesp and Feraheme today.  Continue folic acid.  Patient has been ordered for pneumococcal meningococcal and haemophilus vaccination.  Hyperkalemia  followed by hypokalemia mildly hypokalemic today.  Received Lokelma.  Give 20 mEq of KCl.   Acute on chronic diastolic CHF with chronic hypoxic respiratory failure  Patient was out of her Lasix at home.  Currently on 40 mg IV twice daily.  Continue strict intake and output charting, monitor electrolytes.  Monitor renal function.   CKD IIIb - SCr is 1.83 on admission, up from 1.51 in June 2022.  Creatinine of 1.4 today.  Continue IV Lasix for today consider switching in a.m.   Type II DM with hyperglycemia Hemoglobin A1c was 6.2% in June 2022.  Continue to hold oral hypoglycemic agents, continue sliding scale insulin on steroids.  Debility, deconditioning, ambulatory dysfunction. States that she has DME wheelchair at home for ambulation, physical therapy evaluation prior to discharge. Lives with son at home.   DVT prophylaxis: SCDs Start: 09/12/20 1951  Code Status: Full code  Family Communication: None  Status is: Inpatient  Remains inpatient appropriate because:IV treatments appropriate due to intensity of illness or inability to take PO and Inpatient level of care appropriate due to severity of illness  Dispo: The patient is from: Home              Anticipated d/c is to: Home follow surgical recommendations on splenectomy              Patient currently is not medically stable to d/c.   Difficult to place patient No  Consultants: Oncology General surgery  Procedures: PRBC transfusion III units.  Anti-infectives:  None  Anti-infectives (From admission, onward)    None      Subjective: Today,  patient was seen and examined at bedside.  Denies any chest pain, dizziness, increasing bleeding.   Uses prn oxygen at home.  Objective: Vitals:   09/15/20 0550 09/15/20 0833  BP:    Pulse:    Resp:  19  Temp: 97.6 F (36.4 C) 97.6 F (36.4 C)  SpO2:      Intake/Output Summary (Last 24 hours) at 09/15/2020 0933 Last data filed at 09/15/2020 0837 Gross per 24 hour   Intake 1040 ml  Output 4225 ml  Net -3185 ml    Filed Weights   09/13/20 0153 09/14/20 0615 09/15/20 0600  Weight: 112.4 kg 114 kg 110.9 kg   Body mass index is 38.29 kg/m.   Physical Exam:  General: Obese built, not in obvious distress HENT:   No scleral pallor or icterus noted. Oral mucosa is moist.  Mild right eye redness. Chest:  Clear breath sounds.  Diminished breath sounds bilaterally. No crackles or wheezes.  CVS: S1 &S2 heard. No murmur.  Regular rate and rhythm. Abdomen: Soft, nontender, nondistended.  Bowel sounds are heard.   Extremities: No cyanosis, clubbing left lower extremity edema, right below-knee amputation Psych: Alert, awake and oriented, normal mood CNS:  No cranial nerve deficits.  Power equal in all extremities.   Skin: Warm and dry.  Scattered ecchymosis.   Data Review: I have personally reviewed the following laboratory data and studies,  CBC: Recent Labs  Lab 09/12/20 1552 09/12/20 1910 09/13/20 0833 09/14/20 0144 09/15/20 0441  WBC 2.8*  --  3.5* 2.8* 3.4*  HGB 4.5* 5.4* 5.8* 7.2* 7.5*  HCT 17.8* 16.0* 21.0* 24.0* 24.8*  MCV 84.4  --  82.0 80.0 79.7*  PLT <5*  --  6* 40* 87*    Basic Metabolic Panel: Recent Labs  Lab 09/12/20 1552 09/12/20 1910 09/13/20 0355 09/14/20 0144 09/15/20 0441  NA 134* 138 135 131* 131*  K 5.7* 5.8* 6.0* 4.0 3.2*  CL 108  --  109 101 99  CO2 19*  --  17* 19* 22  GLUCOSE 86  --  125* 307* 381*  BUN 40*  --  43* 47* 51*  CREATININE 1.83*  --  1.74* 1.60* 1.44*  CALCIUM 7.9*  --  8.1* 7.8* 8.0*  MG  --   --   --  2.0  --   PHOS  --   --   --  4.4  --     Liver Function Tests: No results for input(s): AST, ALT, ALKPHOS, BILITOT, PROT, ALBUMIN in the last 168 hours. No results for input(s): LIPASE, AMYLASE in the last 168 hours. No results for input(s): AMMONIA in the last 168 hours. Cardiac Enzymes: No results for input(s): CKTOTAL, CKMB, CKMBINDEX, TROPONINI in the last 168 hours. BNP (last 3  results) Recent Labs    03/08/20 1309 06/12/20 0954 09/12/20 1830  BNP 824.4* 774.5* 1,139.8*     ProBNP (last 3 results) No results for input(s): PROBNP in the last 8760 hours.  CBG: Recent Labs  Lab 09/14/20 1138 09/14/20 1555 09/14/20 2149 09/15/20 0016 09/15/20 0608  GLUCAP 341* 300* 418* 433* 379*    Recent Results (from the past 240 hour(s))  Resp Panel by RT-PCR (Flu A&B, Covid) Nasopharyngeal Swab     Status: None   Collection Time: 09/12/20  6:30 PM   Specimen: Nasopharyngeal Swab; Nasopharyngeal(NP) swabs in vial transport medium  Result Value Ref Range Status   SARS Coronavirus 2 by RT PCR NEGATIVE NEGATIVE Final    Comment: (  NOTE) SARS-CoV-2 target nucleic acids are NOT DETECTED.  The SARS-CoV-2 RNA is generally detectable in upper respiratory specimens during the acute phase of infection. The lowest concentration of SARS-CoV-2 viral copies this assay can detect is 138 copies/mL. A negative result does not preclude SARS-Cov-2 infection and should not be used as the sole basis for treatment or other patient management decisions. A negative result may occur with  improper specimen collection/handling, submission of specimen other than nasopharyngeal swab, presence of viral mutation(s) within the areas targeted by this assay, and inadequate number of viral copies(<138 copies/mL). A negative result must be combined with clinical observations, patient history, and epidemiological information. The expected result is Negative.  Fact Sheet for Patients:  BloggerCourse.com  Fact Sheet for Healthcare Providers:  SeriousBroker.it  This test is no t yet approved or cleared by the [place] FDA and  has been authorized for detection and/or diagnosis of SARS-CoV-2 by FDA under an Emergency Use Authorization (EUA). This EUA will remain  in effect (meaning this test can be used) for the duration of the COVID-19  declaration under Section 564(b)(1) of the Act, 21 U.S.C.section 360bbb-3(b)(1), unless the authorization is terminated  or revoked sooner.       Influenza A by PCR NEGATIVE NEGATIVE Final   Influenza B by PCR NEGATIVE NEGATIVE Final    Comment: (NOTE) The Xpert Xpress SARS-CoV-2/FLU/RSV plus assay is intended as an aid in the diagnosis of influenza from Nasopharyngeal swab specimens and should not be used as a sole basis for treatment. Nasal washings and aspirates are unacceptable for Xpert Xpress SARS-CoV-2/FLU/RSV testing.  Fact Sheet for Patients: BloggerCourse.com  Fact Sheet for Healthcare Providers: SeriousBroker.it  This test is not yet approved or cleared by the [place] FDA and has been authorized for detection and/or diagnosis of SARS-CoV-2 by FDA under an Emergency Use Authorization (EUA). This EUA will remain in effect (meaning this test can be used) for the duration of the COVID-19 declaration under Section 564(b)(1) of the Act, 21 U.S.C. section 360bbb-3(b)(1), unless the authorization is terminated or revoked.  Performed at [facility], [address] [place], [place] [address]       Studies: No results found.   Joycelyn Das, MD  [facility] 09/15/2020  If 7PM-7AM, please contact night-coverage

## 2020-09-15 NOTE — Plan of Care (Signed)
  Problem: Skin Integrity: Goal: Risk for impaired skin integrity will decrease Outcome: Completed/Met   Problem: Safety: Goal: Ability to remain free from injury will improve Outcome: Completed/Met   Problem: Pain Managment: Goal: General experience of comfort will improve Outcome: Completed/Met   Problem: Elimination: Goal: Will not experience complications related to urinary retention Outcome: Completed/Met

## 2020-09-15 NOTE — Evaluation (Signed)
Physical Therapy Evaluation Patient Details Name: [name] MRN: [id number] DOB: [birth year] Today's Date: 09/15/2020  History of Present Illness  [name] is a 55 y.o. female with medical history significant for ITP, type 2 diabetes mellitus, chronic kidney disease, chronic diastolic CHF, chronic hypoxic respiratory failure, history, presenting with shortness of breath, fatigue, and bruising.  Clinical Impression  Pt admitted with above. Pt strongly desired to stand up and get to chair using the walker however despite multiple attempts using assistx2 and stedy but unable to stand. Pt frustrated and very particular on how she does things resisting PT recommendations.Pt kept attempting to complete std pvt and would reach and pull on PT and RN. Pt educated on how unsafe that was. Spoke to patient on doing exercises and going to rehab to build up her strength to then be able to complete std pvt transfer. Pt stated that her son was having to help stand as of late. Unsure how much pt was truly doing on her own PTA. Acute PT to cont to follow.       Recommendations for follow up therapy are one component of a multi-disciplinary discharge planning process, led by the attending physician.  Recommendations may be updated based on patient status, additional functional criteria and insurance authorization.  Follow Up Recommendations SNF;Supervision/Assistance - 24 hour    Equipment Recommendations       Recommendations for Other Services       Precautions / Restrictions Precautions Precautions: Fall Precaution Comments: edema in LEs, unable to don prosthesis Restrictions Weight Bearing Restrictions: No      Mobility  Bed Mobility Overal bed mobility: Needs Assistance Bed Mobility: Rolling;Supine to Sit Rolling: Min assist   Supine to sit: Min assist     General bed mobility comments: definite use of bed rail, unable to stay in sidelying to allow PT to perform hygiene, used  HOB elevation button to elevate the HOB to assist her with getting to EOB, minA at hips    Transfers                 General transfer comment: attempted 2 person assist up to RW, attempted std pvt via face to face lift and use of stedy to transfer to recliner however pt very weak and anxious relying 100% on PT and RN to complete transfer. Unable to get pt up in stedy due to pt's resistance and minimal effort in attempting to pull herself up  Ambulation/Gait             General Gait Details: depends on w/c  Stairs            Wheelchair Mobility    Modified Rankin (Stroke Patients Only)       Balance Overall balance assessment: Needs assistance Sitting-balance support: Feet supported;No upper extremity supported Sitting balance-Leahy Scale: Good         Standing balance comment: unable                             Pertinent Vitals/Pain Pain Assessment: No/denies pain    Home Living Family/patient expects to be discharged to:: Private residence Living Arrangements:  (son) Available Help at Discharge: Family;Available PRN/intermittently (son, who goes to school) Type of Home: House Home Access: Stairs to enter Entrance Stairs-Rails: None Entrance Stairs-Number of Steps: 1 (small step, son assists pt over it in w/c) Home Layout: One level Home Equipment: Bedside commode;Walker - 2 wheels;Wheelchair -  manual;Hospital bed      Prior Function Level of Independence: Needs assistance   Gait / Transfers Assistance Needed: uses w/c primarily because she can't get prosthesis on due to swelling  ADL's / Homemaking Assistance Needed: son helps with transfers as of recent onto commode, bath and dresses self, son drives and grocery shops        Hand Dominance   Dominant Hand: Right    Extremity/Trunk Assessment   Upper Extremity Assessment Upper Extremity Assessment: Generalized weakness    Lower Extremity Assessment Lower Extremity  Assessment: RLE deficits/detail;LLE deficits/detail RLE Deficits / Details: BKA LLE Deficits / Details: generalized weakness       Communication   Communication: No difficulties  Cognition Arousal/Alertness: Awake/alert Behavior During Therapy: Anxious (very particular about how to transfer) Overall Cognitive Status: No family/caregiver present to determine baseline cognitive functioning Area of Impairment: Safety/judgement;Awareness                         Safety/Judgement: Decreased awareness of safety;Decreased awareness of deficits Awareness: Emergent   General Comments: pt with decreased insight to capabilities in regard to transfers, pt resistant to PT recommendations and trying to use the stedy help her OOB and demo'd fear of trying it but couldn't figure out how to "pivot over there" pt reaching for PT and RN to pull on however had to educate that we couldn't have her pull on our necks and arm to lift her due to potential injury      General Comments General comments (skin integrity, edema, etc.): VSS, pt obese, unable to perform pericare properly requiring assist    Exercises     Assessment/Plan    PT Assessment Patient needs continued PT services  PT Problem List Decreased strength;Decreased activity tolerance;Decreased balance;Decreased mobility;Decreased coordination;Decreased cognition;Decreased knowledge of use of DME;Decreased safety awareness       PT Treatment Interventions DME instruction;Gait training;Functional mobility training;Therapeutic activities;Therapeutic exercise;Balance training    PT Goals (Current goals can be found in the Care Plan section)  Acute Rehab PT Goals Patient Stated Goal: stand up all the way PT Goal Formulation: With patient Time For Goal Achievement: 09/29/20 Potential to Achieve Goals: Good    Frequency Min 2X/week   Barriers to discharge Decreased caregiver support son lives with her but she states he goes to  school    Co-evaluation               AM-PAC PT "6 Clicks" Mobility  Outcome Measure Help needed turning from your back to your side while in a flat bed without using bedrails?: A Lot Help needed moving from lying on your back to sitting on the side of a flat bed without using bedrails?: A Lot Help needed moving to and from a bed to a chair (including a wheelchair)?: A Lot Help needed standing up from a chair using your arms (e.g., wheelchair or bedside chair)?: Total Help needed to walk in hospital room?: Total Help needed climbing 3-5 steps with a railing? : Total 6 Click Score: 9    End of Session Equipment Utilized During Treatment: Gait belt Activity Tolerance:  (limited by anxiety) Patient left: in bed;with call bell/phone within reach;with bed alarm set;with nursing/sitter in room (with HOB elevated) Nurse Communication: Mobility status (RN present during session) PT Visit Diagnosis: Unsteadiness on feet (R26.81);Muscle weakness (generalized) (M62.81);Difficulty in walking, not elsewhere classified (R26.2)    Time: 0109-3235 PT Time Calculation (min) (ACUTE ONLY): 53 min  Charges:   PT Evaluation $PT Eval Moderate Complexity: 1 Mod PT Treatments $Therapeutic Activity: 38-52 mins        Lewis Shock, PT, DPT Acute Rehabilitation Services Pager #: [phone] Office #: [phone]   Iona Hansen 09/15/2020, 1:54 PM

## 2020-09-16 DIAGNOSIS — I5033 Acute on chronic diastolic (congestive) heart failure: Secondary | ICD-10-CM | POA: Diagnosis not present

## 2020-09-16 DIAGNOSIS — D693 Immune thrombocytopenic purpura: Secondary | ICD-10-CM | POA: Diagnosis not present

## 2020-09-16 DIAGNOSIS — D649 Anemia, unspecified: Secondary | ICD-10-CM | POA: Diagnosis not present

## 2020-09-16 DIAGNOSIS — N1832 Chronic kidney disease, stage 3b: Secondary | ICD-10-CM | POA: Diagnosis not present

## 2020-09-16 LAB — CBC
HCT: 27.5 % — ABNORMAL LOW (ref 36.0–46.0)
Hemoglobin: 8.3 g/dL — ABNORMAL LOW (ref 12.0–15.0)
MCH: 24.3 pg — ABNORMAL LOW (ref 26.0–34.0)
MCHC: 30.2 g/dL (ref 30.0–36.0)
MCV: 80.4 fL (ref 80.0–100.0)
Platelets: 122 10*3/uL — ABNORMAL LOW (ref 150–400)
RBC: 3.42 MIL/uL — ABNORMAL LOW (ref 3.87–5.11)
RDW: 19.9 % — ABNORMAL HIGH (ref 11.5–15.5)
WBC: 5 10*3/uL (ref 4.0–10.5)
nRBC: 0.8 % — ABNORMAL HIGH (ref 0.0–0.2)

## 2020-09-16 LAB — BPAM RBC
Blood Product Expiration Date: 202209142359
Blood Product Expiration Date: 202209302359
Blood Product Expiration Date: 202210022359
Blood Product Expiration Date: 202210032359
ISSUE DATE / TIME: 202209092004
ISSUE DATE / TIME: 202209101035
ISSUE DATE / TIME: 202209101556
Unit Type and Rh: 7300
Unit Type and Rh: 7300
Unit Type and Rh: 7300
Unit Type and Rh: 7300

## 2020-09-16 LAB — TYPE AND SCREEN
ABO/RH(D): B POS
Antibody Screen: NEGATIVE
Unit division: 0
Unit division: 0
Unit division: 0
Unit division: 0

## 2020-09-16 LAB — GLUCOSE, CAPILLARY
Glucose-Capillary: 377 mg/dL — ABNORMAL HIGH (ref 70–99)
Glucose-Capillary: 339 mg/dL — ABNORMAL HIGH (ref 70–99)
Glucose-Capillary: 405 mg/dL — ABNORMAL HIGH (ref 70–99)
Glucose-Capillary: 255 mg/dL — ABNORMAL HIGH (ref 70–99)
Glucose-Capillary: 309 mg/dL — ABNORMAL HIGH (ref 70–99)

## 2020-09-16 LAB — BASIC METABOLIC PANEL
Anion gap: 8 (ref 5–15)
BUN: 47 mg/dL — ABNORMAL HIGH (ref 6–20)
CO2: 25 mmol/L (ref 22–32)
Calcium: 7.8 mg/dL — ABNORMAL LOW (ref 8.9–10.3)
Chloride: 99 mmol/L (ref 98–111)
Creatinine, Ser: 1.29 mg/dL — ABNORMAL HIGH (ref 0.44–1.00)
GFR, Estimated: 50 mL/min — ABNORMAL LOW (ref >60)
Glucose, Bld: 355 mg/dL — ABNORMAL HIGH (ref 70–99)
Potassium: 3.3 mmol/L — ABNORMAL LOW (ref 3.5–5.1)
Sodium: 132 mmol/L — ABNORMAL LOW (ref 135–145)

## 2020-09-16 LAB — ERYTHROPOIETIN: Erythropoietin: 10.9 m[IU]/mL (ref 2.6–18.5)

## 2020-09-16 LAB — PATHOLOGIST SMEAR REVIEW

## 2020-09-16 MED ORDER — INSULIN ASPART 100 UNIT/ML IJ SOLN
20.0000 [IU] | Freq: Once | INTRAMUSCULAR | Status: AC
  Administered 2020-09-16: 20 [IU] via SUBCUTANEOUS

## 2020-09-16 MED ORDER — FUROSEMIDE 40 MG PO TABS
40.0000 mg | ORAL_TABLET | Freq: Two times a day (BID) | ORAL | Status: DC
  Administered 2020-09-16 – 2020-09-20 (×7): 40 mg via ORAL
  Filled 2020-09-16 (×9): qty 1

## 2020-09-16 MED ORDER — INSULIN ASPART 100 UNIT/ML IJ SOLN
0.0000 [IU] | INTRAMUSCULAR | Status: DC
  Administered 2020-09-16: 11 [IU] via SUBCUTANEOUS
  Administered 2020-09-16: 8 [IU] via SUBCUTANEOUS
  Administered 2020-09-17: 15 [IU] via SUBCUTANEOUS
  Administered 2020-09-17: 8 [IU] via SUBCUTANEOUS

## 2020-09-16 MED ORDER — INSULIN ASPART 100 UNIT/ML IJ SOLN: 0.0000 [IU] | Freq: Three times a day (TID) | INTRAMUSCULAR | Status: DC

## 2020-09-16 MED ORDER — INSULIN ASPART 100 UNIT/ML IJ SOLN
15.0000 [IU] | Freq: Once | INTRAMUSCULAR | Status: AC
  Administered 2020-09-16: 15 [IU] via SUBCUTANEOUS

## 2020-09-16 MED ORDER — POTASSIUM CHLORIDE CRYS ER 20 MEQ PO TBCR
40.0000 meq | EXTENDED_RELEASE_TABLET | Freq: Once | ORAL | Status: AC
  Administered 2020-09-16: 40 meq via ORAL
  Filled 2020-09-16: qty 2

## 2020-09-16 MED ORDER — INSULIN ASPART 100 UNIT/ML IJ SOLN: 0.0000 [IU] | Freq: Every day | INTRAMUSCULAR | Status: DC

## 2020-09-16 MED ORDER — INSULIN GLARGINE-YFGN 100 UNIT/ML ~~LOC~~ SOLN
10.0000 [IU] | Freq: Once | SUBCUTANEOUS | Status: AC
  Administered 2020-09-16: 10 [IU] via SUBCUTANEOUS
  Filled 2020-09-16: qty 0.1

## 2020-09-16 NOTE — Progress Notes (Addendum)
PROGRESS NOTE  MINDI AKERSON FWY:[id number] DOB: 07-23-1965 DOA: 09/12/2020 PCP: Annita Brod, MD   LOS: 4 days   Brief narrative:  [name] is a 55 y.o. female with medical history significant for ITP, type 2 diabetes mellitus, chronic kidney disease, chronic diastolic CHF, chronic hypoxic respiratory failure, presented to the hospital with shortness of breath, fatigue, and bruising for one month and she is now unable to get up without assistance.  Patient stated that she was out of Lasix for 1 month.  Patient was supposed to be following up with hem-oncology but had been busy and was not able to follow-up with him.  In the ED, patient was hemodynamically stable but hemoglobin was 4.5, platelet less than 5.  WBC 2.8.  Patient had mild hyperkalemia with elevated creatinine of 1.8.  Chest x-ray showed cardiomegaly with small pleural effusion and pulmonary edema.  In the ED, patient was treated with IV Lasix, Protonix, dexamethasone, and Lokelma. 2 units PRBC were ordered and was admitted to the hospital.   Assessment/Plan:  Principal Problem:   Symptomatic anemia Active Problems:   Type II diabetes mellitus (HCC)   CKD (chronic kidney disease) stage 3, GFR 30-59 ml/min (HCC)   Idiopathic thrombocytopenic purpura (ITP) (HCC)   Acute on chronic diastolic CHF (congestive heart failure) (HCC)   Pancytopenia (HCC)  Idiopathic thrombocytopenic purpura; pancytopenia; symptomatic anemia  Patient received IV steroids, received Nplate and IVIG.  Oncology on board.  Received 2 units of packed RBC during hospitalization..  As per oncology, platelet count of 122.   Hemoglobin of 8.3 oday, receives Aranesp and Feraheme on 09/15/2020. continue folic acid.  Patient has received pneumococcal meningococcal and haemophilus vaccination.  Plan for splenectomy in AM.  Patient is medically optimized for splenectomy  Hyperkalemia followed by hypokalemia mildly hypokalemic at 3.3.  Continue potassium  supplements   Acute on chronic diastolic CHF with chronic hypoxic respiratory failure  Patient was out of her Lasix at home.  Currently on 40 mg IV twice daily.  Continue Lasix 40 mg p.o. twice daily for now.  Could change back to 40 daily home dose on discharge continue strict intake and output charting, monitor electrolytes.  Monitor renal function.  Improving creatinine.   CKD IIIb - SCr is 1.83 on admission, up from 1.51 in June 2022.  Creatinine of 1.2 today.  We will switch Lasix to oral from today.   Type II DM with hyperglycemia Hemoglobin A1c was 6.2% in June 2022.  Continue to hold oral hypoglycemic agents, continue sliding scale insulin on steroids.  Debility, deconditioning, ambulatory dysfunction. States that she has DME wheelchair at home for ambulation, physical therapy evaluation prior to discharge. Lives with son at home.   DVT prophylaxis: SCDs Start: 09/12/20 1951  Code Status: Full code  Family Communication: None  Status is: Inpatient  Remains inpatient appropriate because:IV treatments appropriate due to intensity of illness or inability to take PO and Inpatient level of care appropriate due to severity of illness  Dispo: The patient is from: Home              Anticipated d/c is to: Home , plan for splenectomy tomorrow or Thursday.              Patient currently is not medically stable to d/c.   Difficult to place patient No  Consultants: Medical oncology General surgery  Procedures: PRBC transfusion III units.  Anti-infectives:  None  Anti-infectives (From admission, onward)    None  Subjective: Today, patient was seen and examined at bedside.  Feels okay with breathing.  She wishes to discuss about splenectomy.  No bleeding or bruises.   Objective: Vitals:   09/16/20 0418 09/16/20 0804  BP:  (!) 177/86  Pulse:  74  Resp:    Temp:  98.4 F (36.9 C)  SpO2: 94% 98%    Intake/Output Summary (Last 24 hours) at 09/16/2020 1010 Last  data filed at 09/16/2020 0854 Gross per 24 hour  Intake 600 ml  Output 3750 ml  Net -3150 ml    Filed Weights   09/14/20 0615 09/15/20 0600 09/16/20 0417  Weight: 114 kg 110.9 kg 108.7 kg   Body mass index is 37.53 kg/m.   Physical Exam:  General: Obese built, not in obvious distress HENT:   No scleral pallor or icterus noted. Oral mucosa is moist.  Mild right eye redness. Chest:  Diminished breath sounds bilaterally. No crackles or wheezes.  CVS: S1 &S2 heard. No murmur.  Regular rate and rhythm. Abdomen: Soft, nontender, nondistended.  Bowel sounds are heard.   Extremities: No cyanosis, clubbing,  left lower extremity edema, right below-knee amputation Psych: Alert, awake and oriented, normal mood CNS:  No cranial nerve deficits.  Power equal in all extremities.   Skin: Warm and dry.  Scattered ecchymosis.   Data Review: I have personally reviewed the following laboratory data and studies,  CBC: Recent Labs  Lab 09/12/20 1552 09/12/20 1910 09/13/20 0833 09/14/20 0144 09/15/20 0441 09/16/20 0350  WBC 2.8*  --  3.5* 2.8* 3.4* 5.0  HGB 4.5* 5.4* 5.8* 7.2* 7.5* 8.3*  HCT 17.8* 16.0* 21.0* 24.0* 24.8* 27.5*  MCV 84.4  --  82.0 80.0 79.7* 80.4  PLT <5*  --  6* 40* 87* 122*    Basic Metabolic Panel: Recent Labs  Lab 09/12/20 1552 09/12/20 1910 09/13/20 0355 09/14/20 0144 09/15/20 0441 09/16/20 0350  NA 134* 138 135 131* 131* 132*  K 5.7* 5.8* 6.0* 4.0 3.2* 3.3*  CL 108  --  109 101 99 99  CO2 19*  --  17* 19* 22 25  GLUCOSE 86  --  125* 307* 381* 355*  BUN 40*  --  43* 47* 51* 47*  CREATININE 1.83*  --  1.74* 1.60* 1.44* 1.29*  CALCIUM 7.9*  --  8.1* 7.8* 8.0* 7.8*  MG  --   --   --  2.0  --   --   PHOS  --   --   --  4.4  --   --     Liver Function Tests: No results for input(s): AST, ALT, ALKPHOS, BILITOT, PROT, ALBUMIN in the last 168 hours. No results for input(s): LIPASE, AMYLASE in the last 168 hours. No results for input(s): AMMONIA in the last  168 hours. Cardiac Enzymes: No results for input(s): CKTOTAL, CKMB, CKMBINDEX, TROPONINI in the last 168 hours. BNP (last 3 results) Recent Labs    03/08/20 1309 06/12/20 0954 09/12/20 1830  BNP 824.4* 774.5* 1,139.8*     ProBNP (last 3 results) No results for input(s): PROBNP in the last 8760 hours.  CBG: Recent Labs  Lab 09/15/20 1033 09/15/20 1623 09/15/20 2119 09/16/20 0603 09/16/20 0818  GLUCAP 376* 337* 373* 377* 339*    Recent Results (from the past 240 hour(s))  Resp Panel by RT-PCR (Flu A&B, Covid) Nasopharyngeal Swab     Status: None   Collection Time: 09/12/20  6:30 PM   Specimen: Nasopharyngeal Swab; Nasopharyngeal(NP) swabs in vial  transport medium  Result Value Ref Range Status   SARS Coronavirus 2 by RT PCR NEGATIVE NEGATIVE Final    Comment: (NOTE) SARS-CoV-2 target nucleic acids are NOT DETECTED.  The SARS-CoV-2 RNA is generally detectable in upper respiratory specimens during the acute phase of infection. The lowest concentration of SARS-CoV-2 viral copies this assay can detect is 138 copies/mL. A negative result does not preclude SARS-Cov-2 infection and should not be used as the sole basis for treatment or other patient management decisions. A negative result may occur with  improper specimen collection/handling, submission of specimen other than nasopharyngeal swab, presence of viral mutation(s) within the areas targeted by this assay, and inadequate number of viral copies(<138 copies/mL). A negative result must be combined with clinical observations, patient history, and epidemiological information. The expected result is Negative.  Fact Sheet for Patients:  BloggerCourse.com  Fact Sheet for Healthcare Providers:  SeriousBroker.it  This test is no t yet approved or cleared by the [place] FDA and  has been authorized for detection and/or diagnosis of SARS-CoV-2 by FDA under an  Emergency Use Authorization (EUA). This EUA will remain  in effect (meaning this test can be used) for the duration of the COVID-19 declaration under Section 564(b)(1) of the Act, 21 U.S.C.section 360bbb-3(b)(1), unless the authorization is terminated  or revoked sooner.       Influenza A by PCR NEGATIVE NEGATIVE Final   Influenza B by PCR NEGATIVE NEGATIVE Final    Comment: (NOTE) The Xpert Xpress SARS-CoV-2/FLU/RSV plus assay is intended as an aid in the diagnosis of influenza from Nasopharyngeal swab specimens and should not be used as a sole basis for treatment. Nasal washings and aspirates are unacceptable for Xpert Xpress SARS-CoV-2/FLU/RSV testing.  Fact Sheet for Patients: BloggerCourse.com  Fact Sheet for Healthcare Providers: SeriousBroker.it  This test is not yet approved or cleared by the [place] FDA and has been authorized for detection and/or diagnosis of SARS-CoV-2 by FDA under an Emergency Use Authorization (EUA). This EUA will remain in effect (meaning this test can be used) for the duration of the COVID-19 declaration under Section 564(b)(1) of the Act, 21 U.S.C. section 360bbb-3(b)(1), unless the authorization is terminated or revoked.  Performed at [facility], [address], [place], [place] [address]       Studies: US Abdomen Limited  Result Date: 09/15/2020 CLINICAL DATA:  History of ITP, assess spleen size EXAM: ULTRASOUND ABDOMEN LIMITED COMPARISON:  CT abdomen/pelvis 06/26/2019 FINDINGS: The spleen size is normal measuring up to 9.8 cm x 10.7 cm x 6.1 cm. There is no focal lesion. IMPRESSION: No evidence of splenomegaly. Electronically Signed   By: Lesia Hausen M.D.   On: 09/15/2020 17:53     Joycelyn Das, MD  [facility] 09/16/2020  If 7PM-7AM, please contact night-coverage

## 2020-09-16 NOTE — Progress Notes (Signed)
Subjective: CC: No abdominal pain, n/v. Hgb and plt improved.   Objective: Vital signs in last 24 hours: Temp:  [97.6 F (36.4 C)-98.4 F (36.9 C)] 98.4 F (36.9 C) (09/13 0804) Pulse Rate:  [70-74] 74 (09/13 0804) Resp:  [18-20] 19 (09/13 0417) BP: (128-177)/(69-93) 177/86 (09/13 0804) SpO2:  [90 %-98 %] 98 % (09/13 0804) Weight:  [108.7 kg] 108.7 kg (09/13 0417) Last BM Date: 09/15/20  Intake/Output from previous day: 09/12 0701 - 09/13 0700 In: 940 [P.O.:940] Out: 3400 [Urine:3400] Intake/Output this shift: Total I/O In: 240 [P.O.:240] Out: 800 [Urine:800]  PE: Gen:  Alert, NAD, pleasant Pulm:  Rate and effort normal Abd: Soft, ND, NT +BS Psych: A&Ox3  Skin: no rashes noted, warm and dry  Lab Results:  Recent Labs    09/15/20 0441 09/16/20 0350  WBC 3.4* 5.0  HGB 7.5* 8.3*  HCT 24.8* 27.5*  PLT 87* 122*   BMET Recent Labs    09/15/20 0441 09/16/20 0350  NA 131* 132*  K 3.2* 3.3*  CL 99 99  CO2 22 25  GLUCOSE 381* 355*  BUN 51* 47*  CREATININE 1.44* 1.29*  CALCIUM 8.0* 7.8*   PT/INR No results for input(s): LABPROT, INR in the last 72 hours. CMP     Component Value Date/Time   NA 132 (L) 09/16/2020 0350   NA 139 05/16/2019 1522   NA 138 09/30/2015 1118   K 3.3 (L) 09/16/2020 0350   K 4.6 09/30/2015 1118   CL 99 09/16/2020 0350   CO2 25 09/16/2020 0350   CO2 19 (L) 09/30/2015 1118   GLUCOSE 355 (H) 09/16/2020 0350   GLUCOSE 200 (H) 09/30/2015 1118   BUN 47 (H) 09/16/2020 0350   BUN 23 05/16/2019 1522   BUN 22.3 09/30/2015 1118   CREATININE 1.29 (H) 09/16/2020 0350   CREATININE 2.63 (H) 06/12/2020 0755   CREATININE 1.3 (H) 09/30/2015 1118   CALCIUM 7.8 (L) 09/16/2020 0350   CALCIUM 9.6 09/30/2015 1118   PROT 7.2 06/12/2020 1009   PROT 8.2 05/16/2019 1522   ALBUMIN 3.4 (L) 06/14/2020 0532   ALBUMIN 4.2 05/16/2019 1522   AST 18 06/12/2020 1009   AST 15 06/12/2020 0755   ALT 12 06/12/2020 1009   ALT 9 06/12/2020 0755    ALKPHOS 64 06/12/2020 1009   BILITOT 0.4 06/12/2020 1009   BILITOT 0.3 06/12/2020 0755   GFRNONAA 50 (L) 09/16/2020 0350   GFRNONAA 21 (L) 06/12/2020 0755   GFRAA 59 (L) 09/19/2019 1449   Lipase     Component Value Date/Time   LIPASE 23 06/26/2019 0124    Studies/Results: US Abdomen Limited  Result Date: 09/15/2020 CLINICAL DATA:  History of ITP, assess spleen size EXAM: ULTRASOUND ABDOMEN LIMITED COMPARISON:  CT abdomen/pelvis 06/26/2019 FINDINGS: The spleen size is normal measuring up to 9.8 cm x 10.7 cm x 6.1 cm. There is no focal lesion. IMPRESSION: No evidence of splenomegaly. Electronically Signed   By: Lesia Hausen M.D.   On: 09/15/2020 17:53    Anti-infectives: Anti-infectives (From admission, onward)    None        Assessment/Plan ITP/thrombocytopenia The patient has been treated for ITP with IVIG and Nplate for the last year at least by Dr. Pamelia Hoit.  She has continued to have significant thrombocytopenia with platelets around 5K.  She has been started on IV steroids here with a dramatic response up to 122k today. Dr. Myna Hidalgo recommending splenectomy. She has already received her  vaccines. [place] w/o splenomegaly. Discussed indications and risks with the patient for splenectomy. Risk include but are not limited to general anesthesia (MI, CVA, death), pain, bleeding, infection, scarring, hernia, and injury to surrounding structures. We also discussed typical post operative care. She asked what percentage likelihood this will induce long term remission. I will reach out to Dr. Myna Hidalgo to see if he can comment on this for her. She states understands risks and would like to proceed. Will plan for laparoscopic assisted splenectomy w/ Dr. Donell Beers tomorrow or Thursday. Will ask TR[facility] to optimize for surgery.    FEN - HH/carb mod. NPO at midnight.  VTE - on hold due to thrombocytopenia ID - none currently   WC bound due to RLE BKA DM CHF HTN H/O CVA   LOS: 4 days    Jacinto Halim , [facility] 09/16/2020, 9:58 AM Please see Amion for pager number during day hours 7:00am-4:30pm

## 2020-09-16 NOTE — Progress Notes (Addendum)
I guess I really should not be surprised that she is hesitant about surgery.  She has done this before.  She has gotten her triple vaccine.  I appreciate pharmacy getting this for her.  Her platelet count is 122,000.  Her hemoglobin is 8.3.  Her  blood sugars are quite high because the steroids she was on.  However, the steroids worked very well for her.  Hopefully, she will have some type of meaningful response.  From my point of view, she can be discharged.  She is followed in our office by Dr. Pamelia Hoit.  I would make sure that she has a follow-up with him in a week or so.  It would be nice if she would decide about splenectomy.  Again I think a splenectomy could induce a long-term remission for her.  Cheryl Bach,  MD  Proverbs 19:17

## 2020-09-16 NOTE — Progress Notes (Signed)
Patient's blood sugar checks were elevated throughout the morning 300+. Highest was 405 before lunch. MD made aware. Received an additional 15 units of  novoLOG.   1600- Recheck CBG 255.

## 2020-09-17 DIAGNOSIS — D649 Anemia, unspecified: Secondary | ICD-10-CM | POA: Diagnosis not present

## 2020-09-17 LAB — CBC WITH DIFFERENTIAL/PLATELET
Abs Immature Granulocytes: 0.1 10*3/uL — ABNORMAL HIGH (ref 0.00–0.07)
Basophils Absolute: 0 10*3/uL (ref 0.0–0.1)
Basophils Relative: 0 %
Eosinophils Absolute: 0 10*3/uL (ref 0.0–0.5)
Eosinophils Relative: 0 %
HCT: 31.5 % — ABNORMAL LOW (ref 36.0–46.0)
Hemoglobin: 9.4 g/dL — ABNORMAL LOW (ref 12.0–15.0)
Immature Granulocytes: 2 %
Lymphocytes Relative: 5 %
Lymphs Abs: 0.3 10*3/uL — ABNORMAL LOW (ref 0.7–4.0)
MCH: 24.3 pg — ABNORMAL LOW (ref 26.0–34.0)
MCHC: 29.8 g/dL — ABNORMAL LOW (ref 30.0–36.0)
MCV: 81.4 fL (ref 80.0–100.0)
Monocytes Absolute: 0.5 10*3/uL (ref 0.1–1.0)
Monocytes Relative: 8 %
Neutro Abs: 5.4 10*3/uL (ref 1.7–7.7)
Neutrophils Relative %: 85 %
Platelets: 171 10*3/uL (ref 150–400)
RBC: 3.87 MIL/uL (ref 3.87–5.11)
RDW: 20.6 % — ABNORMAL HIGH (ref 11.5–15.5)
WBC: 6.3 10*3/uL (ref 4.0–10.5)
nRBC: 0.3 % — ABNORMAL HIGH (ref 0.0–0.2)

## 2020-09-17 LAB — GLUCOSE, CAPILLARY
Glucose-Capillary: 117 mg/dL — ABNORMAL HIGH (ref 70–99)
Glucose-Capillary: 170 mg/dL — ABNORMAL HIGH (ref 70–99)
Glucose-Capillary: 173 mg/dL — ABNORMAL HIGH (ref 70–99)
Glucose-Capillary: 192 mg/dL — ABNORMAL HIGH (ref 70–99)
Glucose-Capillary: 195 mg/dL — ABNORMAL HIGH (ref 70–99)
Glucose-Capillary: 261 mg/dL — ABNORMAL HIGH (ref 70–99)
Glucose-Capillary: 359 mg/dL — ABNORMAL HIGH (ref 70–99)

## 2020-09-17 LAB — COMPREHENSIVE METABOLIC PANEL
ALT: 19 U/L (ref 0–44)
AST: 26 U/L (ref 15–41)
Albumin: 2.8 g/dL — ABNORMAL LOW (ref 3.5–5.0)
Alkaline Phosphatase: 48 U/L (ref 38–126)
Anion gap: 6 (ref 5–15)
BUN: 40 mg/dL — ABNORMAL HIGH (ref 6–20)
CO2: 29 mmol/L (ref 22–32)
Calcium: 7.8 mg/dL — ABNORMAL LOW (ref 8.9–10.3)
Chloride: 101 mmol/L (ref 98–111)
Creatinine, Ser: 1.12 mg/dL — ABNORMAL HIGH (ref 0.44–1.00)
GFR, Estimated: 59 mL/min — ABNORMAL LOW (ref >60)
Glucose, Bld: 184 mg/dL — ABNORMAL HIGH (ref 70–99)
Potassium: 3.2 mmol/L — ABNORMAL LOW (ref 3.5–5.1)
Sodium: 136 mmol/L (ref 135–145)
Total Bilirubin: 0.9 mg/dL (ref 0.3–1.2)
Total Protein: 7.5 g/dL (ref 6.5–8.1)

## 2020-09-17 MED ORDER — HYDRALAZINE HCL 10 MG PO TABS
10.0000 mg | ORAL_TABLET | Freq: Once | ORAL | Status: AC
  Administered 2020-09-17: 10 mg via ORAL
  Filled 2020-09-17: qty 1

## 2020-09-17 MED ORDER — INSULIN GLARGINE-YFGN 100 UNIT/ML ~~LOC~~ SOLN
10.0000 [IU] | Freq: Once | SUBCUTANEOUS | Status: AC
  Administered 2020-09-17: 10 [IU] via SUBCUTANEOUS
  Filled 2020-09-17 (×2): qty 0.1

## 2020-09-17 MED ORDER — POTASSIUM CHLORIDE CRYS ER 20 MEQ PO TBCR
40.0000 meq | EXTENDED_RELEASE_TABLET | ORAL | Status: AC
  Administered 2020-09-17 (×2): 40 meq via ORAL
  Filled 2020-09-17 (×2): qty 2

## 2020-09-17 MED ORDER — POTASSIUM CHLORIDE 10 MEQ/100ML IV SOLN
10.0000 meq | INTRAVENOUS | Status: DC
  Filled 2020-09-17: qty 100

## 2020-09-17 MED ORDER — INSULIN ASPART 100 UNIT/ML IJ SOLN
0.0000 [IU] | INTRAMUSCULAR | Status: DC
  Administered 2020-09-17: 6 [IU] via SUBCUTANEOUS
  Administered 2020-09-17 (×2): 3 [IU] via SUBCUTANEOUS
  Administered 2020-09-18: 2 [IU] via SUBCUTANEOUS
  Administered 2020-09-18 – 2020-09-21 (×9): 3 [IU] via SUBCUTANEOUS
  Administered 2020-09-21 – 2020-09-22 (×3): 2 [IU] via SUBCUTANEOUS
  Administered 2020-09-22 – 2020-09-23 (×2): 3 [IU] via SUBCUTANEOUS
  Administered 2020-09-23: 2 [IU] via SUBCUTANEOUS
  Administered 2020-09-24: 3 [IU] via SUBCUTANEOUS
  Administered 2020-09-24 – 2020-09-27 (×10): 2 [IU] via SUBCUTANEOUS
  Administered 2020-09-28: 3 [IU] via SUBCUTANEOUS
  Administered 2020-09-28 (×2): 2 [IU] via SUBCUTANEOUS
  Administered 2020-09-29 (×2): 3 [IU] via SUBCUTANEOUS
  Administered 2020-09-29 – 2020-09-30 (×2): 2 [IU] via SUBCUTANEOUS
  Administered 2020-09-30 – 2020-10-01 (×5): 3 [IU] via SUBCUTANEOUS
  Administered 2020-10-02: 2 [IU] via SUBCUTANEOUS

## 2020-09-17 MED ORDER — AMLODIPINE BESYLATE 10 MG PO TABS
10.0000 mg | ORAL_TABLET | Freq: Every day | ORAL | Status: DC
  Administered 2020-09-17 – 2020-09-20 (×4): 10 mg via ORAL
  Filled 2020-09-17 (×4): qty 1

## 2020-09-17 NOTE — Progress Notes (Addendum)
PROGRESS NOTE  [name]:[id number] DOB: 1965/03/25 DOA: 09/12/2020 PCP: Annita Brod, MD   LOS: 5 days   Brief narrative:  [name] is a 55 y.o. female with medical history significant for ITP, type 2 diabetes mellitus, chronic kidney disease, chronic diastolic CHF, chronic hypoxic respiratory failure, presented to the hospital with shortness of breath, fatigue, and bruising for one month and she is now unable to get up without assistance.  Patient stated that she was out of Lasix for 1 month.  Patient was supposed to be following up with hem-oncology but had been busy and was not able to follow-up with him.  In the ED, patient was hemodynamically stable but hemoglobin was 4.5, platelet less than 5.  WBC 2.8.  Patient had mild hyperkalemia with elevated creatinine of 1.8.  Chest x-ray showed cardiomegaly with small pleural effusion and pulmonary edema.  In the ED, patient was treated with IV Lasix, Protonix, dexamethasone, and Lokelma. 2 units PRBC were ordered and was admitted to the hospital.   Assessment/Plan:  Principal Problem:   Symptomatic anemia Active Problems:   Type II diabetes mellitus (HCC)   CKD (chronic kidney disease) stage 3, GFR 30-59 ml/min (HCC)   Idiopathic thrombocytopenic purpura (ITP) (HCC)   Acute on chronic diastolic CHF (congestive heart failure) (HCC)   Pancytopenia (HCC)  Idiopathic thrombocytopenic purpura; pancytopenia; symptomatic anemia  Presented with platelet of < 5. patient received IV steroids, received Nplate and IVIG.  Oncology on board.  Received 2 units of packed RBC during hospitalization..received Aranesp and Feraheme on 09/15/2020. continue folic acid.  Patient has received pneumococcal meningococcal and haemophilus vaccination.  Platelets improved over 100 now.  Oncology recommended splenectomy.  General surgery consulted.  Plan for splenectomy either today or tomorrow depending on OR availability.   Hypokalemia: Low again.   Will replace  Essential hypertension: Slightly elevated.  Will increase amlodipine to 10 mg and continue current dose of Imdur and Toprol-XL.  Acute on chronic diastolic CHF with chronic hypoxic respiratory failure  Patient was out of her Lasix at home.  Currently on 40 mg IV twice daily.  Continue Lasix 40 mg p.o. twice daily for now.  Could change back to 40 daily home dose on discharge continue strict intake and output charting, monitor electrolytes.  Monitor renal function.  Improving creatinine.   CKD IIIb - SCr is 1.83 on admission, up from 1.51 in June 2022.  Creatinine 1.12 today.  Monitor closely.   Type II DM with hyperglycemia Hemoglobin A1c was 6.2% in June 2022.  Although better than yesterday but still hyperglycemic here despite of receiving Lantus 10 units yesterday.  We will repeat 10 units today.  Continue SSI.  Debility, deconditioning, ambulatory dysfunction. States that she has DME wheelchair at home for ambulation, physical therapy evaluation prior to discharge. Lives with son at home.   DVT prophylaxis: SCDs Start: 09/12/20 1951  Code Status: Full code  Family Communication: None  Status is: Inpatient  Remains inpatient appropriate because:IV treatments appropriate due to intensity of illness or inability to take PO and Inpatient level of care appropriate due to severity of illness  Dispo: The patient is from: Home              Anticipated d/c is to: Home , plan for splenectomy today or tomorrow.              Patient currently is not medically stable to d/c.   Difficult to place patient No  Consultants: Medical oncology General surgery  Procedures: PRBC transfusion III units.  Anti-infectives:  None  Anti-infectives (From admission, onward)    None      Subjective: Seen and examined.  She has no complaints.   Objective: Vitals:   09/17/20 0500 09/17/20 0716  BP: (!) 172/77 (!) 169/72  Pulse:  64  Resp:  15  Temp:  97.7 F (36.5 C)   SpO2:  99%    Intake/Output Summary (Last 24 hours) at 09/17/2020 1237 Last data filed at 09/17/2020 0858 Gross per 24 hour  Intake 1050 ml  Output 4050 ml  Net -3000 ml    Filed Weights   09/15/20 0600 09/16/20 0417 09/17/20 0415  Weight: 110.9 kg 108.7 kg 107.6 kg   Body mass index is 37.15 kg/m.   Physical Exam:  General exam: Appears calm and comfortable  Respiratory system: Clear to auscultation. Respiratory effort normal. Cardiovascular system: S1 & S2 heard, RRR. No JVD, murmurs, rubs, gallops or clicks. No pedal edema. Gastrointestinal system: Abdomen is nondistended, soft and nontender. No organomegaly or masses felt. Normal bowel sounds heard. Central nervous system: Alert and oriented. No focal neurological deficits. Extremities: Symmetric 5 x 5 power. Skin: No rashes, lesions or ulcers.  Psychiatry: Judgement and insight appear normal. Mood & affect appropriate.    Data Review: I have personally reviewed the following laboratory data and studies,  CBC: Recent Labs  Lab 09/13/20 0833 09/14/20 0144 09/15/20 0441 09/16/20 0350 09/17/20 0651  WBC 3.5* 2.8* 3.4* 5.0 6.3  NEUTROABS  --   --   --   --  5.4  HGB 5.8* 7.2* 7.5* 8.3* 9.4*  HCT 21.0* 24.0* 24.8* 27.5* 31.5*  MCV 82.0 80.0 79.7* 80.4 81.4  PLT 6* 40* 87* 122* 171    Basic Metabolic Panel: Recent Labs  Lab 09/13/20 0355 09/14/20 0144 09/15/20 0441 09/16/20 0350 09/17/20 0651  NA 135 131* 131* 132* 136  K 6.0* 4.0 3.2* 3.3* 3.2*  CL 109 101 99 99 101  CO2 17* 19* 22 25 29   GLUCOSE 125* 307* 381* 355* 184*  BUN 43* 47* 51* 47* 40*  CREATININE 1.74* 1.60* 1.44* 1.29* 1.12*  CALCIUM 8.1* 7.8* 8.0* 7.8* 7.8*  MG  --  2.0  --   --   --   PHOS  --  4.4  --   --   --     Liver Function Tests: Recent Labs  Lab 09/17/20 0651  AST 26  ALT 19  ALKPHOS 48  BILITOT 0.9  PROT 7.5  ALBUMIN 2.8*   No results for input(s): LIPASE, AMYLASE in the last 168 hours. No results for input(s):  AMMONIA in the last 168 hours. Cardiac Enzymes: No results for input(s): CKTOTAL, CKMB, CKMBINDEX, TROPONINI in the last 168 hours. BNP (last 3 results) Recent Labs    03/08/20 1309 06/12/20 0954 09/12/20 1830  BNP 824.4* 774.5* 1,139.8*     ProBNP (last 3 results) No results for input(s): PROBNP in the last 8760 hours.  CBG: Recent Labs  Lab 09/16/20 2024 09/17/20 0016 09/17/20 0419 09/17/20 0715 09/17/20 1156  GLUCAP 309* 359* 261* 170* 117*    Recent Results (from the past 240 hour(s))  Resp Panel by RT-PCR (Flu A&B, Covid) Nasopharyngeal Swab     Status: None   Collection Time: 09/12/20  6:30 PM   Specimen: Nasopharyngeal Swab; Nasopharyngeal(NP) swabs in vial transport medium  Result Value Ref Range Status   SARS Coronavirus 2 by RT PCR NEGATIVE  NEGATIVE Final    Comment: (NOTE) SARS-CoV-2 target nucleic acids are NOT DETECTED.  The SARS-CoV-2 RNA is generally detectable in upper respiratory specimens during the acute phase of infection. The lowest concentration of SARS-CoV-2 viral copies this assay can detect is 138 copies/mL. A negative result does not preclude SARS-Cov-2 infection and should not be used as the sole basis for treatment or other patient management decisions. A negative result may occur with  improper specimen collection/handling, submission of specimen other than nasopharyngeal swab, presence of viral mutation(s) within the areas targeted by this assay, and inadequate number of viral copies(<138 copies/mL). A negative result must be combined with clinical observations, patient history, and epidemiological information. The expected result is Negative.  Fact Sheet for Patients:  BloggerCourse.com  Fact Sheet for Healthcare Providers:  SeriousBroker.it  This test is no t yet approved or cleared by the [place] FDA and  has been authorized for detection and/or diagnosis of SARS-CoV-2  by FDA under an Emergency Use Authorization (EUA). This EUA will remain  in effect (meaning this test can be used) for the duration of the COVID-19 declaration under Section 564(b)(1) of the Act, 21 U.S.C.section 360bbb-3(b)(1), unless the authorization is terminated  or revoked sooner.       Influenza A by PCR NEGATIVE NEGATIVE Final   Influenza B by PCR NEGATIVE NEGATIVE Final    Comment: (NOTE) The Xpert Xpress SARS-CoV-2/FLU/RSV plus assay is intended as an aid in the diagnosis of influenza from Nasopharyngeal swab specimens and should not be used as a sole basis for treatment. Nasal washings and aspirates are unacceptable for Xpert Xpress SARS-CoV-2/FLU/RSV testing.  Fact Sheet for Patients: BloggerCourse.com  Fact Sheet for Healthcare Providers: SeriousBroker.it  This test is not yet approved or cleared by the [place] FDA and has been authorized for detection and/or diagnosis of SARS-CoV-2 by FDA under an Emergency Use Authorization (EUA). This EUA will remain in effect (meaning this test can be used) for the duration of the COVID-19 declaration under Section 564(b)(1) of the Act, 21 U.S.C. section 360bbb-3(b)(1), unless the authorization is terminated or revoked.  Performed at [facility], [address] [place], [place] [address]       Studies: US Abdomen Limited  Result Date: 09/15/2020 CLINICAL DATA:  History of ITP, assess spleen size EXAM: ULTRASOUND ABDOMEN LIMITED COMPARISON:  CT abdomen/pelvis 06/26/2019 FINDINGS: The spleen size is normal measuring up to 9.8 cm x 10.7 cm x 6.1 cm. There is no focal lesion. IMPRESSION: No evidence of splenomegaly. Electronically Signed   By: Lesia Hausen M.D.   On: 09/15/2020 17:53     Hughie Closs, MD  [facility] 09/17/2020  If 7PM-7AM, please contact night-coverage

## 2020-09-17 NOTE — Progress Notes (Signed)
Surprisingly enough, [name] is going to have her spleen taken out.  She will have this taken out tomorrow from what I understand.  I think this is a great idea.  I realize her blood sugars still need to be under better control.  Hopefully that will happen once she is off her steroids.  There is no labs back yet today.  We will have to see what the CBC shows.  She has had no bleeding.  There is no fever.  Her appetite is doing okay.  She has had no issues with bowels or bladder.  Her vital signs are temperature 97.7.  Pulse 66.  Blood pressure 172/77.  Her lungs are clear bilaterally.  Cardiac exam regular rate and rhythm.  Abdomen is soft.  She has somewhat obese.  There is no fluid wave.  Extremity shows the right BKA.  Again, I think is a great idea for her to have the spleen taken out.  She has had her vaccines already.  It sounds like surgery we will try to do this laparoscopically.  This would really help with healing.  Believe that the chance of her going to remission for a significant time should be 70-80%.  She clearly responded to steroids so I believe that she should respond to have her spleen removed.  I do appreciate her brace great care.  Christin Bach, MD  John 10:11

## 2020-09-17 NOTE — Care Management Important Message (Signed)
Important Message  Patient Details  Name: [name] MRN: [id number] Date of Birth: 04-24-1965   Medicare Important Message Given:  Yes     Eliah Ozawa Stefan Church 09/17/2020, 3:40 PM

## 2020-09-17 NOTE — Plan of Care (Signed)
  Problem: Clinical Measurements: Goal: Ability to maintain clinical measurements within normal limits will improve Outcome: Progressing Goal: Will remain free from infection Outcome: Progressing Goal: Diagnostic test results will improve Outcome: Progressing Goal: Respiratory complications will improve Outcome: Progressing Goal: Cardiovascular complication will be avoided Outcome: Progressing   Problem: Activity: Goal: Risk for activity intolerance will decrease Outcome: Progressing   Problem: Nutrition: Goal: Adequate nutrition will be maintained Outcome: Progressing   Problem: Coping: Goal: Level of anxiety will decrease Outcome: Progressing   

## 2020-09-17 NOTE — Care Management Important Message (Signed)
Important Message  Patient Details  Name: [name] MRN: [id number] Date of Birth: Oct 12, 1965   Medicare Important Message Given:  Yes     Samanta Gal Stefan Church 09/17/2020, 9:01 AM

## 2020-09-17 NOTE — H&P (View-Only) (Signed)
Day of Surgery  Subjective: CC: Patient denies any abdominal pain  Objective: Vital signs in last 24 hours: Temp:  [97.4 F (36.3 C)-97.8 F (36.6 C)] 97.7 F (36.5 C) (09/14 0415) Pulse Rate:  [64-67] 64 (09/14 0716) Resp:  [14-20] 15 (09/14 0716) BP: (140-176)/(56-83) 169/72 (09/14 0716) SpO2:  [94 %-99 %] 99 % (09/14 0716) Weight:  [107.6 kg] 107.6 kg (09/14 0415) Last BM Date: 09/16/20  Intake/Output from previous day: 09/13 0701 - 09/14 0700 In: 1290 [P.O.:1290] Out: 4150 [Urine:4150] Intake/Output this shift: Total I/O In: -  Out: 700 [Urine:700]  PE: Gen:  Alert, NAD, pleasant Card:  Reg Pulm:  CTAB, no W/R/R, effort normal Abd: Soft, ND, NT +BS Ext:  R BKA. No LLE edema Psych: A&Ox3  Skin: no rashes noted, warm and dry  Lab Results:  Recent Labs    09/16/20 0350 09/17/20 0651  WBC 5.0 6.3  HGB 8.3* 9.4*  HCT 27.5* 31.5*  PLT 122* 171   BMET Recent Labs    09/16/20 0350 09/17/20 0651  NA 132* 136  K 3.3* 3.2*  CL 99 101  CO2 25 29  GLUCOSE 355* 184*  BUN 47* 40*  CREATININE 1.29* 1.12*  CALCIUM 7.8* 7.8*   PT/INR No results for input(s): LABPROT, INR in the last 72 hours. CMP     Component Value Date/Time   NA 136 09/17/2020 0651   NA 139 05/16/2019 1522   NA 138 09/30/2015 1118   K 3.2 (L) 09/17/2020 0651   K 4.6 09/30/2015 1118   CL 101 09/17/2020 0651   CO2 29 09/17/2020 0651   CO2 19 (L) 09/30/2015 1118   GLUCOSE 184 (H) 09/17/2020 0651   GLUCOSE 200 (H) 09/30/2015 1118   BUN 40 (H) 09/17/2020 0651   BUN 23 05/16/2019 1522   BUN 22.3 09/30/2015 1118   CREATININE 1.12 (H) 09/17/2020 0651   CREATININE 2.63 (H) 06/12/2020 0755   CREATININE 1.3 (H) 09/30/2015 1118   CALCIUM 7.8 (L) 09/17/2020 0651   CALCIUM 9.6 09/30/2015 1118   PROT 7.5 09/17/2020 0651   PROT 8.2 05/16/2019 1522   ALBUMIN 2.8 (L) 09/17/2020 0651   ALBUMIN 4.2 05/16/2019 1522   AST 26 09/17/2020 0651   AST 15 06/12/2020 0755   ALT 19 09/17/2020 0651    ALT 9 06/12/2020 0755   ALKPHOS 48 09/17/2020 0651   BILITOT 0.9 09/17/2020 0651   BILITOT 0.3 06/12/2020 0755   GFRNONAA 59 (L) 09/17/2020 0651   GFRNONAA 21 (L) 06/12/2020 0755   GFRAA 59 (L) 09/19/2019 1449   Lipase     Component Value Date/Time   LIPASE 23 06/26/2019 0124    Studies/Results: US Abdomen Limited  Result Date: 09/15/2020 CLINICAL DATA:  History of ITP, assess spleen size EXAM: ULTRASOUND ABDOMEN LIMITED COMPARISON:  CT abdomen/pelvis 06/26/2019 FINDINGS: The spleen size is normal measuring up to 9.8 cm x 10.7 cm x 6.1 cm. There is no focal lesion. IMPRESSION: No evidence of splenomegaly. Electronically Signed   By: Lesia Hausen M.D.   On: 09/15/2020 17:53    Anti-infectives: Anti-infectives (From admission, onward)    None        Assessment/Plan ITP/Thrombocytopenia - The patient has been treated for ITP with IVIG and Nplate for the last year at least by Dr. Pamelia Hoit.  She has continued to have significant thrombocytopenia with platelets around 5K.  She has been started on IV steroids here with a dramatic response up to 117k today. Dr.  Ennever recommending splenectomy. She has already received her vaccines. [place] w/o splenomegaly.  - Plan for hand assisted lap splenectomy w/ Dr. Donell Beers today. We discussed the possibility of this being delayed to tomorrow pending OR availability.    FEN - NPO VTE - on hold due to thrombocytopenia ID - none currently   WC bound due to RLE BKA DM CHF HTN H/O CVA   LOS: 5 days    Jacinto Halim , [facility] 09/17/2020, 9:35 AM Please see Amion for pager number during day hours 7:00am-4:30pm

## 2020-09-17 NOTE — Progress Notes (Signed)
Day of Surgery  Subjective: CC: Patient denies any abdominal pain  Objective: Vital signs in last 24 hours: Temp:  [97.4 F (36.3 C)-97.8 F (36.6 C)] 97.7 F (36.5 C) (09/14 0415) Pulse Rate:  [64-67] 64 (09/14 0716) Resp:  [14-20] 15 (09/14 0716) BP: (140-176)/(56-83) 169/72 (09/14 0716) SpO2:  [94 %-99 %] 99 % (09/14 0716) Weight:  [107.6 kg] 107.6 kg (09/14 0415) Last BM Date: 09/16/20  Intake/Output from previous day: 09/13 0701 - 09/14 0700 In: 1290 [P.O.:1290] Out: 4150 [Urine:4150] Intake/Output this shift: Total I/O In: -  Out: 700 [Urine:700]  PE: Gen:  Alert, NAD, pleasant Card:  Reg Pulm:  CTAB, no W/R/R, effort normal Abd: Soft, ND, NT +BS Ext:  R BKA. No LLE edema Psych: A&Ox3  Skin: no rashes noted, warm and dry  Lab Results:  Recent Labs    09/16/20 0350 09/17/20 0651  WBC 5.0 6.3  HGB 8.3* 9.4*  HCT 27.5* 31.5*  PLT 122* 171   BMET Recent Labs    09/16/20 0350 09/17/20 0651  NA 132* 136  K 3.3* 3.2*  CL 99 101  CO2 25 29  GLUCOSE 355* 184*  BUN 47* 40*  CREATININE 1.29* 1.12*  CALCIUM 7.8* 7.8*   PT/INR No results for input(s): LABPROT, INR in the last 72 hours. CMP     Component Value Date/Time   NA 136 09/17/2020 0651   NA 139 05/16/2019 1522   NA 138 09/30/2015 1118   K 3.2 (L) 09/17/2020 0651   K 4.6 09/30/2015 1118   CL 101 09/17/2020 0651   CO2 29 09/17/2020 0651   CO2 19 (L) 09/30/2015 1118   GLUCOSE 184 (H) 09/17/2020 0651   GLUCOSE 200 (H) 09/30/2015 1118   BUN 40 (H) 09/17/2020 0651   BUN 23 05/16/2019 1522   BUN 22.3 09/30/2015 1118   CREATININE 1.12 (H) 09/17/2020 0651   CREATININE 2.63 (H) 06/12/2020 0755   CREATININE 1.3 (H) 09/30/2015 1118   CALCIUM 7.8 (L) 09/17/2020 0651   CALCIUM 9.6 09/30/2015 1118   PROT 7.5 09/17/2020 0651   PROT 8.2 05/16/2019 1522   ALBUMIN 2.8 (L) 09/17/2020 0651   ALBUMIN 4.2 05/16/2019 1522   AST 26 09/17/2020 0651   AST 15 06/12/2020 0755   ALT 19 09/17/2020 0651    ALT 9 06/12/2020 0755   ALKPHOS 48 09/17/2020 0651   BILITOT 0.9 09/17/2020 0651   BILITOT 0.3 06/12/2020 0755   GFRNONAA 59 (L) 09/17/2020 0651   GFRNONAA 21 (L) 06/12/2020 0755   GFRAA 59 (L) 09/19/2019 1449   Lipase     Component Value Date/Time   LIPASE 23 06/26/2019 0124    Studies/Results: US Abdomen Limited  Result Date: 09/15/2020 CLINICAL DATA:  History of ITP, assess spleen size EXAM: ULTRASOUND ABDOMEN LIMITED COMPARISON:  CT abdomen/pelvis 06/26/2019 FINDINGS: The spleen size is normal measuring up to 9.8 cm x 10.7 cm x 6.1 cm. There is no focal lesion. IMPRESSION: No evidence of splenomegaly. Electronically Signed   By: Lesia Hausen M.D.   On: 09/15/2020 17:53    Anti-infectives: Anti-infectives (From admission, onward)    None        Assessment/Plan ITP/Thrombocytopenia - The patient has been treated for ITP with IVIG and Nplate for the last year at least by Dr. Pamelia Hoit.  She has continued to have significant thrombocytopenia with platelets around 5K.  She has been started on IV steroids here with a dramatic response up to 117k today. Dr.  Ennever recommending splenectomy. She has already received her vaccines. [place] w/o splenomegaly.  - Plan for hand assisted lap splenectomy w/ Dr. Donell Beers today. We discussed the possibility of this being delayed to tomorrow pending OR availability.    FEN - NPO VTE - on hold due to thrombocytopenia ID - none currently   WC bound due to RLE BKA DM CHF HTN H/O CVA   LOS: 5 days    Jacinto Halim , [facility] 09/17/2020, 9:35 AM Please see Amion for pager number during day hours 7:00am-4:30pm

## 2020-09-18 ENCOUNTER — Encounter (HOSPITAL_COMMUNITY): Payer: Self-pay | Admitting: Family Medicine

## 2020-09-18 ENCOUNTER — Encounter (HOSPITAL_COMMUNITY)
Admission: EM | Disposition: A | Discharge status: Rehabilitation Facility | Payer: Self-pay | Source: Home / Self Care | Attending: Family Medicine

## 2020-09-18 ENCOUNTER — Inpatient Hospital Stay (HOSPITAL_COMMUNITY): Payer: Medicare Other | Admitting: Anesthesiology

## 2020-09-18 DIAGNOSIS — D649 Anemia, unspecified: Secondary | ICD-10-CM | POA: Diagnosis not present

## 2020-09-18 HISTORY — PX: LAPAROSCOPIC SPLENECTOMY: SHX409

## 2020-09-18 LAB — CBC WITH DIFFERENTIAL/PLATELET
Abs Immature Granulocytes: 0 10*3/uL (ref 0.00–0.07)
Basophils Absolute: 0 10*3/uL (ref 0.0–0.1)
Basophils Relative: 0 %
Eosinophils Absolute: 0 10*3/uL (ref 0.0–0.5)
Eosinophils Relative: 0 %
HCT: 31.4 % — ABNORMAL LOW (ref 36.0–46.0)
Hemoglobin: 9.1 g/dL — ABNORMAL LOW (ref 12.0–15.0)
Lymphocytes Relative: 6 %
Lymphs Abs: 0.4 10*3/uL — ABNORMAL LOW (ref 0.7–4.0)
MCH: 24.3 pg — ABNORMAL LOW (ref 26.0–34.0)
MCHC: 29 g/dL — ABNORMAL LOW (ref 30.0–36.0)
MCV: 84 fL (ref 80.0–100.0)
Monocytes Absolute: 0.5 10*3/uL (ref 0.1–1.0)
Monocytes Relative: 7 %
Neutro Abs: 6 10*3/uL (ref 1.7–7.7)
Neutrophils Relative %: 87 %
Platelets: 174 10*3/uL (ref 150–400)
RBC: 3.74 MIL/uL — ABNORMAL LOW (ref 3.87–5.11)
RDW: 21.6 % — ABNORMAL HIGH (ref 11.5–15.5)
WBC: 6.9 10*3/uL (ref 4.0–10.5)
nRBC: 0 % (ref 0.0–0.2)
nRBC: 1 /100 WBC — ABNORMAL HIGH

## 2020-09-18 LAB — COMPREHENSIVE METABOLIC PANEL
ALT: 33 U/L (ref 0–44)
AST: 45 U/L — ABNORMAL HIGH (ref 15–41)
Albumin: 2.6 g/dL — ABNORMAL LOW (ref 3.5–5.0)
Alkaline Phosphatase: 47 U/L (ref 38–126)
Anion gap: 5 (ref 5–15)
BUN: 36 mg/dL — ABNORMAL HIGH (ref 6–20)
CO2: 29 mmol/L (ref 22–32)
Calcium: 7.5 mg/dL — ABNORMAL LOW (ref 8.9–10.3)
Chloride: 102 mmol/L (ref 98–111)
Creatinine, Ser: 1.09 mg/dL — ABNORMAL HIGH (ref 0.44–1.00)
GFR, Estimated: >60 mL/min (ref >60)
Glucose, Bld: 124 mg/dL — ABNORMAL HIGH (ref 70–99)
Potassium: 4.1 mmol/L (ref 3.5–5.1)
Sodium: 136 mmol/L (ref 135–145)
Total Bilirubin: 0.9 mg/dL (ref 0.3–1.2)
Total Protein: 6.6 g/dL (ref 6.5–8.1)

## 2020-09-18 LAB — POCT I-STAT 7, (LYTES, BLD GAS, ICA,H+H)
Acid-Base Excess: 4 mmol/L — ABNORMAL HIGH (ref 0.0–2.0)
Bicarbonate: 29.4 mmol/L — ABNORMAL HIGH (ref 20.0–28.0)
Calcium, Ion: 1.08 mmol/L — ABNORMAL LOW (ref 1.15–1.40)
HCT: 29 % — ABNORMAL LOW (ref 36.0–46.0)
Hemoglobin: 9.9 g/dL — ABNORMAL LOW (ref 12.0–15.0)
O2 Saturation: 96 %
Patient temperature: 35.5
Potassium: 4 mmol/L (ref 3.5–5.1)
Sodium: 139 mmol/L (ref 135–145)
TCO2: 31 mmol/L (ref 22–32)
pCO2 arterial: 45 mmHg (ref 32.0–48.0)
pH, Arterial: 7.417 (ref 7.350–7.450)
pO2, Arterial: 74 mmHg — ABNORMAL LOW (ref 83.0–108.0)

## 2020-09-18 LAB — GLUCOSE, CAPILLARY
Glucose-Capillary: 120 mg/dL — ABNORMAL HIGH (ref 70–99)
Glucose-Capillary: 138 mg/dL — ABNORMAL HIGH (ref 70–99)
Glucose-Capillary: 117 mg/dL — ABNORMAL HIGH (ref 70–99)
Glucose-Capillary: 150 mg/dL — ABNORMAL HIGH (ref 70–99)
Glucose-Capillary: 157 mg/dL — ABNORMAL HIGH (ref 70–99)
Glucose-Capillary: 178 mg/dL — ABNORMAL HIGH (ref 70–99)
Glucose-Capillary: 188 mg/dL — ABNORMAL HIGH (ref 70–99)

## 2020-09-18 LAB — PREPARE RBC (CROSSMATCH)

## 2020-09-18 SURGERY — SPLENECTOMY, LAPAROSCOPIC
Anesthesia: General

## 2020-09-18 MED ORDER — ONDANSETRON HCL 4 MG/2ML IJ SOLN
INTRAMUSCULAR | Status: AC
  Filled 2020-09-18: qty 2

## 2020-09-18 MED ORDER — ACETAMINOPHEN 500 MG PO TABS
1000.0000 mg | ORAL_TABLET | Freq: Three times a day (TID) | ORAL | Status: DC
  Administered 2020-09-18 – 2020-09-21 (×8): 1000 mg via ORAL
  Filled 2020-09-18 (×8): qty 2

## 2020-09-18 MED ORDER — GLYCOPYRROLATE PF 0.2 MG/ML IJ SOSY
PREFILLED_SYRINGE | INTRAMUSCULAR | Status: AC
  Filled 2020-09-18: qty 1

## 2020-09-18 MED ORDER — MIDAZOLAM HCL 2 MG/2ML IJ SOLN
INTRAMUSCULAR | Status: AC
  Filled 2020-09-18: qty 2

## 2020-09-18 MED ORDER — OXYCODONE HCL 5 MG/5ML PO SOLN: 5.0000 mg | Freq: Once | ORAL | Status: DC | PRN

## 2020-09-18 MED ORDER — OXYCODONE HCL 5 MG PO TABS
5.0000 mg | ORAL_TABLET | ORAL | Status: DC | PRN
  Administered 2020-09-18: 10 mg via ORAL
  Administered 2020-09-19 – 2020-09-23 (×6): 5 mg via ORAL
  Administered 2020-09-25 – 2020-09-26 (×3): 10 mg via ORAL
  Filled 2020-09-18: qty 2
  Filled 2020-09-18 (×2): qty 1
  Filled 2020-09-18 (×2): qty 2
  Filled 2020-09-18 (×4): qty 1
  Filled 2020-09-18: qty 2

## 2020-09-18 MED ORDER — DEXAMETHASONE SODIUM PHOSPHATE 10 MG/ML IJ SOLN
INTRAMUSCULAR | Status: AC
  Filled 2020-09-18: qty 1

## 2020-09-18 MED ORDER — OXYCODONE HCL 5 MG PO TABS
5.0000 mg | ORAL_TABLET | Freq: Once | ORAL | Status: DC | PRN
Start: 2020-09-18 — End: 2020-09-18

## 2020-09-18 MED ORDER — GLYCOPYRROLATE 0.2 MG/ML IJ SOLN
INTRAMUSCULAR | Status: DC | PRN
  Administered 2020-09-18: .2 mg via INTRAVENOUS

## 2020-09-18 MED ORDER — MEPERIDINE HCL 25 MG/ML IJ SOLN: 6.2500 mg | INTRAMUSCULAR | Status: DC | PRN

## 2020-09-18 MED ORDER — BUPIVACAINE LIPOSOME 1.3 % IJ SUSP
INTRAMUSCULAR | Status: AC
  Filled 2020-09-18: qty 20

## 2020-09-18 MED ORDER — SODIUM CHLORIDE 0.9 % IV SOLN: 10.0000 mL/h | Freq: Once | INTRAVENOUS | Status: AC

## 2020-09-18 MED ORDER — ACETAMINOPHEN 500 MG PO TABS
1000.0000 mg | ORAL_TABLET | Freq: Once | ORAL | Status: AC
  Administered 2020-09-18: 1000 mg via ORAL
  Filled 2020-09-18: qty 2

## 2020-09-18 MED ORDER — LACTATED RINGERS IV SOLN: INTRAVENOUS | Status: DC

## 2020-09-18 MED ORDER — BUPIVACAINE LIPOSOME 1.3 % IJ SUSP
INTRAMUSCULAR | Status: DC | PRN
  Administered 2020-09-18: 20 mL

## 2020-09-18 MED ORDER — LIDOCAINE 2% (20 MG/ML) 5 ML SYRINGE
INTRAMUSCULAR | Status: AC
  Filled 2020-09-18: qty 5

## 2020-09-18 MED ORDER — PROPOFOL 10 MG/ML IV BOLUS
INTRAVENOUS | Status: AC
  Filled 2020-09-18: qty 20

## 2020-09-18 MED ORDER — PHENYLEPHRINE 40 MCG/ML (10ML) SYRINGE FOR IV PUSH (FOR BLOOD PRESSURE SUPPORT)
PREFILLED_SYRINGE | INTRAVENOUS | Status: DC | PRN
Start: 2020-09-18 — End: 2020-09-18
  Administered 2020-09-18 (×2): 80 ug via INTRAVENOUS
  Administered 2020-09-18: 40 ug via INTRAVENOUS

## 2020-09-18 MED ORDER — ACETAMINOPHEN 325 MG PO TABS: 325.0000 mg | ORAL_TABLET | ORAL | Status: DC | PRN

## 2020-09-18 MED ORDER — ACETAMINOPHEN 160 MG/5ML PO SOLN: 325.0000 mg | ORAL | Status: DC | PRN

## 2020-09-18 MED ORDER — PHENYLEPHRINE HCL-NACL 20-0.9 MG/250ML-% IV SOLN
INTRAVENOUS | Status: DC | PRN
  Administered 2020-09-18: 50 ug/min via INTRAVENOUS

## 2020-09-18 MED ORDER — ONDANSETRON HCL 4 MG/2ML IJ SOLN
INTRAMUSCULAR | Status: DC | PRN
  Administered 2020-09-18: 4 mg via INTRAVENOUS

## 2020-09-18 MED ORDER — SODIUM CHLORIDE 0.9 % IV SOLN
2.0000 g | Freq: Once | INTRAVENOUS | Status: AC
  Administered 2020-09-18: 2 g via INTRAVENOUS

## 2020-09-18 MED ORDER — ALBUMIN HUMAN 5 % IV SOLN
12.5000 g | Freq: Once | INTRAVENOUS | Status: AC
  Administered 2020-09-18: 12.5 g via INTRAVENOUS

## 2020-09-18 MED ORDER — ALBUMIN HUMAN 5 % IV SOLN
INTRAVENOUS | Status: AC
  Filled 2020-09-18: qty 250

## 2020-09-18 MED ORDER — SUGAMMADEX SODIUM 200 MG/2ML IV SOLN
INTRAVENOUS | Status: DC | PRN
  Administered 2020-09-18: 50 mg via INTRAVENOUS
  Administered 2020-09-18: 150 mg via INTRAVENOUS

## 2020-09-18 MED ORDER — FENTANYL CITRATE (PF) 100 MCG/2ML IJ SOLN
INTRAMUSCULAR | Status: DC | PRN
  Administered 2020-09-18 (×5): 50 ug via INTRAVENOUS

## 2020-09-18 MED ORDER — FENTANYL CITRATE (PF) 100 MCG/2ML IJ SOLN
INTRAMUSCULAR | Status: AC
  Filled 2020-09-18: qty 2

## 2020-09-18 MED ORDER — ROCURONIUM BROMIDE 10 MG/ML (PF) SYRINGE
PREFILLED_SYRINGE | INTRAVENOUS | Status: AC
  Filled 2020-09-18: qty 10

## 2020-09-18 MED ORDER — DEXAMETHASONE SODIUM PHOSPHATE 10 MG/ML IJ SOLN
INTRAMUSCULAR | Status: DC | PRN
  Administered 2020-09-18: 5 mg via INTRAVENOUS

## 2020-09-18 MED ORDER — FENTANYL CITRATE (PF) 250 MCG/5ML IJ SOLN
INTRAMUSCULAR | Status: AC
  Filled 2020-09-18: qty 5

## 2020-09-18 MED ORDER — LIDOCAINE 2% (20 MG/ML) 5 ML SYRINGE
INTRAMUSCULAR | Status: DC | PRN
  Administered 2020-09-18: 80 mg via INTRAVENOUS

## 2020-09-18 MED ORDER — PHENYLEPHRINE HCL (PRESSORS) 10 MG/ML IV SOLN
INTRAVENOUS | Status: AC
  Filled 2020-09-18: qty 2

## 2020-09-18 MED ORDER — ROCURONIUM BROMIDE 100 MG/10ML IV SOLN
INTRAVENOUS | Status: DC | PRN
  Administered 2020-09-18: 10 mg via INTRAVENOUS
  Administered 2020-09-18: 80 mg via INTRAVENOUS
  Administered 2020-09-18: 10 mg via INTRAVENOUS
  Administered 2020-09-18: 20 mg via INTRAVENOUS

## 2020-09-18 MED ORDER — ORAL CARE MOUTH RINSE: 15.0000 mL | Freq: Once | OROMUCOSAL | Status: AC

## 2020-09-18 MED ORDER — PROPOFOL 10 MG/ML IV BOLUS
INTRAVENOUS | Status: DC | PRN
  Administered 2020-09-18: 10 mg via INTRAVENOUS
  Administered 2020-09-18: 150 mg via INTRAVENOUS
  Administered 2020-09-18: 10 mg via INTRAVENOUS

## 2020-09-18 MED ORDER — HYDROMORPHONE HCL 1 MG/ML IJ SOLN
0.5000 mg | INTRAMUSCULAR | Status: DC | PRN
  Administered 2020-09-18 – 2020-09-19 (×2): 0.5 mg via INTRAVENOUS
  Filled 2020-09-18 (×2): qty 0.5

## 2020-09-18 MED ORDER — FENTANYL CITRATE (PF) 100 MCG/2ML IJ SOLN
25.0000 ug | INTRAMUSCULAR | Status: DC | PRN
  Administered 2020-09-18: 25 ug via INTRAVENOUS

## 2020-09-18 MED ORDER — 0.9 % SODIUM CHLORIDE (POUR BTL) OPTIME
TOPICAL | Status: DC | PRN
  Administered 2020-09-18: 1000 mL

## 2020-09-18 MED ORDER — EPHEDRINE SULFATE-NACL 50-0.9 MG/10ML-% IV SOSY
PREFILLED_SYRINGE | INTRAVENOUS | Status: DC | PRN
  Administered 2020-09-18: 5 mg via INTRAVENOUS

## 2020-09-18 MED ORDER — BUPIVACAINE-EPINEPHRINE (PF) 0.25% -1:200000 IJ SOLN
INTRAMUSCULAR | Status: AC
  Filled 2020-09-18: qty 30

## 2020-09-18 MED ORDER — LIDOCAINE HCL 1 % IJ SOLN
INTRAMUSCULAR | Status: DC | PRN
  Administered 2020-09-18: 5 mL via INTRAMUSCULAR

## 2020-09-18 MED ORDER — ONDANSETRON HCL 4 MG/2ML IJ SOLN: 4.0000 mg | Freq: Once | INTRAMUSCULAR | Status: DC | PRN

## 2020-09-18 MED ORDER — SODIUM CHLORIDE 0.9 % IV SOLN
INTRAVENOUS | Status: AC
  Filled 2020-09-18: qty 2

## 2020-09-18 MED ORDER — CHLORHEXIDINE GLUCONATE 0.12 % MT SOLN
OROMUCOSAL | Status: AC
  Administered 2020-09-18: 15 mL via OROMUCOSAL
  Filled 2020-09-18: qty 15

## 2020-09-18 MED ORDER — LIDOCAINE HCL (PF) 1 % IJ SOLN
INTRAMUSCULAR | Status: AC
  Filled 2020-09-18: qty 30

## 2020-09-18 MED ORDER — CHLORHEXIDINE GLUCONATE 0.12 % MT SOLN: 15.0000 mL | Freq: Once | OROMUCOSAL | Status: AC

## 2020-09-18 SURGICAL SUPPLY — 64 items
ADH SKN CLS APL DERMABOND .7 (GAUZE/BANDAGES/DRESSINGS) ×1
APL LAPSCP 35 DL APL RGD (MISCELLANEOUS) ×1
APL PRP STRL LF DISP 70% ISPRP (MISCELLANEOUS) ×1
APPLICATOR VISTASEAL 35 (MISCELLANEOUS) ×2 IMPLANT
BAG COUNTER SPONGE SURGICOUNT (BAG) ×2 IMPLANT
BAG SPNG CNTER NS LX DISP (BAG) ×1
BIOPATCH RED 1 DISK 7.0 (GAUZE/BANDAGES/DRESSINGS) ×1 IMPLANT
BLADE CLIPPER SURG (BLADE) IMPLANT
CANISTER SUCT 3000ML PPV (MISCELLANEOUS) IMPLANT
CHLORAPREP W/TINT 26 (MISCELLANEOUS) ×2 IMPLANT
COVER SURGICAL LIGHT HANDLE (MISCELLANEOUS) ×2 IMPLANT
DERMABOND ADVANCED (GAUZE/BANDAGES/DRESSINGS) ×1
DERMABOND ADVANCED .7 DNX12 (GAUZE/BANDAGES/DRESSINGS) IMPLANT
DRAIN CHANNEL 19F RND (DRAIN) ×1 IMPLANT
DRAPE LAPAROSCOPIC ABDOMINAL (DRAPES) ×2 IMPLANT
DRAPE WARM FLUID 44X44 (DRAPES) ×2 IMPLANT
DRSG PAD ABDOMINAL 8X10 ST (GAUZE/BANDAGES/DRESSINGS) IMPLANT
DRSG TEGADERM 2-3/8X2-3/4 SM (GAUZE/BANDAGES/DRESSINGS) ×1 IMPLANT
ELECT REM PT RETURN 9FT ADLT (ELECTROSURGICAL) ×2
ELECTRODE REM PT RTRN 9FT ADLT (ELECTROSURGICAL) ×1 IMPLANT
EVACUATOR SILICONE 100CC (DRAIN) ×1 IMPLANT
GAUZE SPONGE 4X4 12PLY STRL (GAUZE/BANDAGES/DRESSINGS) IMPLANT
GLOVE SURG ENC MOIS LTX SZ6 (GLOVE) ×2 IMPLANT
GLOVE SURG UNDER LTX SZ6.5 (GLOVE) ×2 IMPLANT
GOWN STRL REUS W/ TWL LRG LVL3 (GOWN DISPOSABLE) ×3 IMPLANT
GOWN STRL REUS W/TWL 2XL LVL3 (GOWN DISPOSABLE) ×2 IMPLANT
GOWN STRL REUS W/TWL LRG LVL3 (GOWN DISPOSABLE) ×6
KIT BASIN OR (CUSTOM PROCEDURE TRAY) ×2 IMPLANT
KIT TURNOVER KIT B (KITS) ×2 IMPLANT
L-HOOK LAP DISP 36CM (ELECTROSURGICAL) ×2
LHOOK LAP DISP 36CM (ELECTROSURGICAL) ×1 IMPLANT
NS IRRIG 1000ML POUR BTL (IV SOLUTION) ×2 IMPLANT
PAD ARMBOARD 7.5X6 YLW CONV (MISCELLANEOUS) ×4 IMPLANT
PENCIL BUTTON HOLSTER BLD 10FT (ELECTRODE) ×2 IMPLANT
POUCH ENDO CATCH II 15MM (MISCELLANEOUS) IMPLANT
RELOAD STAPLE 60 2.6 WHT THN (STAPLE) ×4 IMPLANT
RELOAD STAPLER WHITE 60MM (STAPLE) ×3 IMPLANT
SCISSORS LAP 5X35 DISP (ENDOMECHANICALS) ×1 IMPLANT
SET IRRIG TUBING LAPAROSCOPIC (IRRIGATION / IRRIGATOR) ×2 IMPLANT
SET TUBE SMOKE EVAC HIGH FLOW (TUBING) ×1 IMPLANT
SHEARS HARMONIC ACE PLUS 36CM (ENDOMECHANICALS) ×1 IMPLANT
SHEARS HARMONIC HDI 36CM (ELECTROSURGICAL) ×2 IMPLANT
SLEEVE ENDOPATH XCEL 5M (ENDOMECHANICALS) ×6 IMPLANT
SLEEVE SURGEON STRL (DRAPES) ×2 IMPLANT
STAPLE ECHEON FLEX 60 POW ENDO (STAPLE) ×2 IMPLANT
STAPLER RELOAD WHITE 60MM (STAPLE) ×6
SUT ETHILON 2 0 FS 18 (SUTURE) ×1 IMPLANT
SUT MNCRL AB 4-0 PS2 18 (SUTURE) ×3 IMPLANT
SUT NOVA 1 T20/GS 25DT (SUTURE) ×2 IMPLANT
SUT VIC AB 3-0 SH 27 (SUTURE) ×2
SUT VIC AB 3-0 SH 27XBRD (SUTURE) IMPLANT
SYS LAPSCP GELPORT 120MM (MISCELLANEOUS) ×2
SYSTEM LAPSCP GELPORT 120MM (MISCELLANEOUS) IMPLANT
TOWEL GREEN STERILE (TOWEL DISPOSABLE) ×2 IMPLANT
TOWEL GREEN STERILE FF (TOWEL DISPOSABLE) ×2 IMPLANT
TRAY FOLEY MTR SLVR 14FR STAT (SET/KITS/TRAYS/PACK) ×2 IMPLANT
TRAY LAPAROSCOPIC MC (CUSTOM PROCEDURE TRAY) ×2 IMPLANT
TROCAR BLADELESS 15MM (ENDOMECHANICALS) ×2 IMPLANT
TROCAR XCEL 12X100 BLDLESS (ENDOMECHANICALS) IMPLANT
TROCAR XCEL NON-BLD 11X100MML (ENDOMECHANICALS) IMPLANT
TROCAR XCEL NON-BLD 5MMX100MML (ENDOMECHANICALS) ×2 IMPLANT
TUBING EVAC SMOKE HEATED PNEUM (TUBING) ×2 IMPLANT
WARMER LAPAROSCOPE (MISCELLANEOUS) ×2 IMPLANT
WATER STERILE IRR 1000ML POUR (IV SOLUTION) ×2 IMPLANT

## 2020-09-18 NOTE — Anesthesia Preprocedure Evaluation (Addendum)
Anesthesia Evaluation  Patient identified by MRN, date of birth, ID band Patient awake    Reviewed: Allergy & Precautions, H&P , NPO status , Patient's Chart, lab work & pertinent test results  History of Anesthesia Complications Negative for: history of anesthetic complications  Airway Mallampati: II  TM Distance: >3 FB Neck ROM: Full    Dental  (+) Teeth Intact, Dental Advisory Given, Missing   Pulmonary neg pulmonary ROS, Patient abstained from smoking.,    Pulmonary exam normal breath sounds clear to auscultation       Cardiovascular hypertension, Pt. on medications and Pt. on home beta blockers Normal cardiovascular exam Rhythm:Regular Rate:Normal     Neuro/Psych Intracerebral hemorrhage  As a teenager  States she had burst blood vessel as teenager, now with resultant minor visual field and hearing deficits   Neuromuscular disease CVA, Residual Symptoms    GI/Hepatic negative GI ROS, Neg liver ROS,   Endo/Other  diabetes, Type 2, Insulin Dependent  Renal/GU CRFRenal diseasenegative Renal ROS     Musculoskeletal  (+) Arthritis , Osteoarthritis,    Abdominal (+) + obese,   Peds  Hematology  (+) Blood dyscrasia, anemia ,   Anesthesia Other Findings   Reproductive/Obstetrics                            Anesthesia Physical  Anesthesia Plan  ASA: 3  Anesthesia Plan: General   Post-op Pain Management:    Induction: Intravenous  PONV Risk Score and Plan: 3 and Ondansetron  Airway Management Planned: Oral ETT  Additional Equipment: None  Intra-op Plan:   Post-operative Plan: Extubation in OR  Informed Consent: I have reviewed the patients History and Physical, chart, labs and discussed the procedure including the risks, benefits and alternatives for the proposed anesthesia with the patient or authorized representative who has indicated his/her understanding and acceptance.      Dental advisory given  Plan Discussed with: CRNA, Anesthesiologist and Surgeon  Anesthesia Plan Comments:         Anesthesia Quick Evaluation

## 2020-09-18 NOTE — Transfer of Care (Signed)
Immediate Anesthesia Transfer of Care Note  Patient: [name]  Procedure(s) Performed: HAND ASSISTED LAPAROSCOPIC SPLENECTOMY  Patient Location: PACU  Anesthesia Type:General  Level of Consciousness: awake, alert  and oriented  Airway & Oxygen Therapy: Patient Spontanous Breathing and Patient connected to face mask oxygen  Post-op Assessment: Report given to RN, Post -op Vital signs reviewed and stable and Patient moving all extremities X 4  Post vital signs: Reviewed and stable  Last Vitals:  Vitals Value Taken Time  BP 97/53 09/18/20 1625  Temp 36.3 C 09/18/20 1625  Pulse 57 09/18/20 1628  Resp 17 09/18/20 1628  SpO2 100 % 09/18/20 1628  Vitals shown include unvalidated device data.  Last Pain:  Vitals:   09/18/20 1052  TempSrc:   PainSc: 0-No pain      Patients Stated Pain Goal: 0 (09/15/20 0910)  Complications: No notable events documented.

## 2020-09-18 NOTE — Anesthesia Procedure Notes (Signed)
Procedure Name: Intubation Date/Time: 09/18/2020 11:58 AM Performed by: Marny Lowenstein, CRNA Pre-anesthesia Checklist: Patient identified, Emergency Drugs available, Suction available and Patient being monitored Patient Re-evaluated:Patient Re-evaluated prior to induction Oxygen Delivery Method: Circle system utilized Preoxygenation: Pre-oxygenation with 100% oxygen Induction Type: IV induction Ventilation: Mask ventilation without difficulty and Oral airway inserted - appropriate to patient size Laryngoscope Size: Hyacinth Meeker and 2 Grade View: Grade II Tube type: Oral Tube size: 7.0 mm Number of attempts: 1 Airway Equipment and Method: Patient positioned with wedge pillow and Stylet Placement Confirmation: ETT inserted through vocal cords under direct vision, positive ETCO2 and breath sounds checked- equal and bilateral Secured at: 22 cm Tube secured with: Tape Dental Injury: Teeth and Oropharynx as per pre-operative assessment  Comments: Intubated by [facility]

## 2020-09-18 NOTE — Progress Notes (Signed)
Spoke on the phone with Dr. Donell Beers regarding blood transfusion order. No need to give blood at this time. Order was left over from the OR and blood was not needed.   Jacobo Forest, RN

## 2020-09-18 NOTE — Progress Notes (Signed)
Physical Therapy Treatment Patient Details Name: [name] MRN: [id number] DOB: 1965-08-01 Today's Date: 09/18/2020   History of Present Illness [name] is a 55 y.o. female with medical history significant for ITP, type 2 diabetes mellitus, chronic kidney disease, chronic diastolic CHF, chronic hypoxic respiratory failure, history, presenting 09/12/20 with shortness of breath, fatigue, and bruising.    PT Comments    Pt reporting that her mind was focused on her planned surgery today and requested to not sit EOB or get OOB this date. Pt agreeable to bed level exercises with green theraband and education on HEP handout provided this session, see Exercises and General Comments below. Will continue to follow acutely. Current recommendations remain appropriate.   Recommendations for follow up therapy are one component of a multi-disciplinary discharge planning process, led by the attending physician.  Recommendations may be updated based on patient status, additional functional criteria and insurance authorization.  Follow Up Recommendations  SNF;Supervision/Assistance - 24 hour     Equipment Recommendations  Other (comment) (TBD next venue of care)    Recommendations for Other Services       Precautions / Restrictions Precautions Precautions: Fall Precaution Comments: edema in LEs, unable to don prosthesis, hx of R BKA Restrictions Weight Bearing Restrictions: No     Mobility  Bed Mobility               General bed mobility comments: Pt requested to not get OOB due to wanting to focus on her planned surgery today. Focused on bed level exercises today.    Transfers                 General transfer comment: Pt requested to not get OOB due to wanting to focus on her planned surgery today. Focused on bed level exercises today.  Ambulation/Gait             General Gait Details: Pt requested to not get OOB due to wanting to focus on her planned  surgery today. Focused on bed level exercises today.   Stairs             Wheelchair Mobility    Modified Rankin (Stroke Patients Only)       Balance                                            Cognition Arousal/Alertness: Awake/alert Behavior During Therapy: Anxious (focused on surgery today) Overall Cognitive Status: No family/caregiver present to determine baseline cognitive functioning Area of Impairment: Safety/judgement;Awareness;Following commands;Memory;Attention                   Current Attention Level: Sustained Memory: Decreased short-term memory Following Commands: Follows one step commands inconsistently;Follows one step commands with increased time Safety/Judgement: Decreased awareness of safety;Decreased awareness of deficits Awareness: Emergent   General Comments: Pt self-distracted, reporting her mind is focused on planned surgery today. Pt needed repeated simple cues to perform each exercise correctly, forgetting often and needing directing again.      Exercises General Exercises - Upper Extremity Shoulder Flexion: Strengthening;Both;5 reps;Supine;Theraband Theraband Level (Shoulder Flexion): Level 3 (Green) Shoulder Horizontal ABduction: Strengthening;Both;5 reps;Supine;Theraband Theraband Level (Shoulder Horizontal Abduction): Level 3 (Green) Elbow Flexion: Strengthening;Both;5 reps;Supine;Theraband Theraband Level (Elbow Flexion): Level 3 (Green) Elbow Extension: Strengthening;Both;5 reps;Supine;Theraband Theraband Level (Elbow Extension): Level 3 (Green) Other Exercises Other Exercises: Green theraband resistance with supine  L knee and hip extension, 5x    General Comments General comments (skin integrity, edema, etc.): MedBridge Theraband HEP Access Code: [email or web address]; provided pt with green theraband      Pertinent Vitals/Pain Pain Assessment: No/denies pain    Home Living                      Prior  Function            PT Goals (current goals can now be found in the care plan section) Acute Rehab PT Goals Patient Stated Goal: to get stronger PT Goal Formulation: With patient Time For Goal Achievement: 09/29/20 Potential to Achieve Goals: Good Progress towards PT goals: Not progressing toward goals - comment (limited by anxiety)    Frequency    Min 2X/week      PT Plan Current plan remains appropriate    Co-evaluation              AM-PAC PT "6 Clicks" Mobility   Outcome Measure  Help needed turning from your back to your side while in a flat bed without using bedrails?: A Lot Help needed moving from lying on your back to sitting on the side of a flat bed without using bedrails?: A Lot Help needed moving to and from a bed to a chair (including a wheelchair)?: A Lot Help needed standing up from a chair using your arms (e.g., wheelchair or bedside chair)?: Total Help needed to walk in hospital room?: Total Help needed climbing 3-5 steps with a railing? : Total 6 Click Score: 9    End of Session   Activity Tolerance:  (limited by anxiety) Patient left: in bed;with call bell/phone within reach;with bed alarm set;with nursing/sitter in room Nurse Communication: Mobility status (RN present during session) PT Visit Diagnosis: Unsteadiness on feet (R26.81);Muscle weakness (generalized) (M62.81);Difficulty in walking, not elsewhere classified (R26.2)     Time: 7793-9030 PT Time Calculation (min) (ACUTE ONLY): 21 min  Charges:  $Therapeutic Exercise: 8-22 mins                     Raymond Gurney, PT, DPT Acute Rehabilitation Services  Pager: [phone] Office: [phone]    Jewel Baize 09/18/2020, 11:28 AM

## 2020-09-18 NOTE — Progress Notes (Signed)
PROGRESS NOTE  [name] BTD:[id number] DOB: 04-07-1965 DOA: 09/12/2020 PCP: Annita Brod, MD   LOS: 6 days   Brief narrative:  [name] is a 55 y.o. female with medical history significant for ITP, type 2 diabetes mellitus, chronic kidney disease, chronic diastolic CHF, chronic hypoxic respiratory failure, presented to the hospital with shortness of breath, fatigue, and bruising for one month and she is now unable to get up without assistance.  Patient stated that she was out of Lasix for 1 month.  Patient was supposed to be following up with hem-oncology but had been busy and was not able to follow-up with him.  In the ED, patient was hemodynamically stable but hemoglobin was 4.5, platelet less than 5.  WBC 2.8.  Patient had mild hyperkalemia with elevated creatinine of 1.8.  Chest x-ray showed cardiomegaly with small pleural effusion and pulmonary edema.  In the ED, patient was treated with IV Lasix, Protonix, dexamethasone, and Lokelma. 2 units PRBC were ordered and was admitted to the hospital.   Assessment/Plan:  Principal Problem:   Symptomatic anemia Active Problems:   Type II diabetes mellitus (HCC)   CKD (chronic kidney disease) stage 3, GFR 30-59 ml/min (HCC)   Idiopathic thrombocytopenic purpura (ITP) (HCC)   Acute on chronic diastolic CHF (congestive heart failure) (HCC)   Pancytopenia (HCC)  Idiopathic thrombocytopenic purpura; pancytopenia; symptomatic anemia  Presented with platelet of < 5. patient received IV steroids, received Nplate and IVIG.  Oncology on board.  Received 2 units of packed RBC during hospitalization..received Aranesp and Feraheme on 09/15/2020. continue folic acid.  Patient has received pneumococcal meningococcal and haemophilus vaccination.  Platelets improved and 174 today with oncology recommended splenectomy.  General surgery consulted.  Plan for splenectomy today.  Hypokalemia: Resolved.  Essential hypertension: Controlled.  Continue  amlodipine to 10 mg and continue current dose of Imdur and Toprol-XL.  Acute on chronic diastolic CHF with chronic hypoxic respiratory failure  Patient was out of her Lasix at home.  Continue Lasix 40 mg p.o. twice daily for now.  Could change back to 40 daily home dose on discharge continue strict intake and output charting, monitor electrolytes.  Monitor renal function.  Improving creatinine.   CKD IIIb - SCr is 1.83 on admission, up from 1.51 in June 2022.  Creatinine 1.12 today.  Monitor closely.   Type II DM with hyperglycemia Hemoglobin A1c was 6.2% in June 2022.  Received 10 units of Lantus yesterday, blood sugar was a stable this morning but due to her n.p.o. status, no further Lantus was given.  Continue SSI.  Debility, deconditioning, ambulatory dysfunction. States that she has DME wheelchair at home for ambulation, physical therapy evaluation prior to discharge. Lives with son at home.   DVT prophylaxis: SCDs Start: 09/12/20 1951  Code Status: Full code  Family Communication: None  Status is: Inpatient  Remains inpatient appropriate because:IV treatments appropriate due to intensity of illness or inability to take PO and Inpatient level of care appropriate due to severity of illness  Dispo: The patient is from: Home              Anticipated d/c is to: Home               Patient currently is not medically stable to d/c.   Difficult to place patient No  Consultants: Medical oncology General surgery  Procedures: PRBC transfusion III units.  Anti-infectives:  None  Anti-infectives (From admission, onward)    Start  Dose/Rate Route Frequency Ordered Stop   09/18/20 1115  cefoTEtan (CEFOTAN) 2 g in sodium chloride 0.9 % 100 mL IVPB        2 g 200 mL/hr over 30 Minutes Intravenous  Once 09/18/20 1112 09/18/20 1250   09/18/20 1114  sodium chloride 0.9 % with cefoTEtan (CEFOTAN) ADS Med       Note to Pharmacy: Janene Harvey   : cabinet override      09/18/20 1114  09/18/20 1253      Subjective: Patient seen and examined.  She has no complaints.   Objective: Vitals:   09/18/20 0818 09/18/20 1047  BP: (!) 152/76 (!) 144/66  Pulse: 68 62  Resp: 14 15  Temp: 97.9 F (36.6 C) 98 F (36.7 C)  SpO2: 96% 97%    Intake/Output Summary (Last 24 hours) at 09/18/2020 1305 Last data filed at 09/18/2020 1259 Gross per 24 hour  Intake 1270 ml  Output 800 ml  Net 470 ml    Filed Weights   09/16/20 0417 09/17/20 0415 09/18/20 0425  Weight: 108.7 kg 107.6 kg 106.8 kg   Body mass index is 36.88 kg/m.   Physical Exam:  General exam: Appears calm and comfortable  Respiratory system: Clear to auscultation. Respiratory effort normal. Cardiovascular system: S1 & S2 heard, RRR. No JVD, murmurs, rubs, gallops or clicks. No pedal edema. Gastrointestinal system: Abdomen is nondistended, soft and nontender. No organomegaly or masses felt. Normal bowel sounds heard. Central nervous system: Alert and oriented. No focal neurological deficits. Extremities: Symmetric 5 x 5 power. Skin: No rashes, lesions or ulcers.  Psychiatry: Judgement and insight appear normal. Mood & affect appropriate.   Data Review: I have personally reviewed the following laboratory data and studies,  CBC: Recent Labs  Lab 09/14/20 0144 09/15/20 0441 09/16/20 0350 09/17/20 0651 09/18/20 0420  WBC 2.8* 3.4* 5.0 6.3 6.9  NEUTROABS  --   --   --  5.4 6.0  HGB 7.2* 7.5* 8.3* 9.4* 9.1*  HCT 24.0* 24.8* 27.5* 31.5* 31.4*  MCV 80.0 79.7* 80.4 81.4 84.0  PLT 40* 87* 122* 171 174    Basic Metabolic Panel: Recent Labs  Lab 09/14/20 0144 09/15/20 0441 09/16/20 0350 09/17/20 0651 09/18/20 0420  NA 131* 131* 132* 136 136  K 4.0 3.2* 3.3* 3.2* 4.1  CL 101 99 99 101 102  CO2 19* 22 25 29 29   GLUCOSE 307* 381* 355* 184* 124*  BUN 47* 51* 47* 40* 36*  CREATININE 1.60* 1.44* 1.29* 1.12* 1.09*  CALCIUM 7.8* 8.0* 7.8* 7.8* 7.5*  MG 2.0  --   --   --   --   PHOS 4.4  --   --   --    --     Liver Function Tests: Recent Labs  Lab 09/17/20 0651 09/18/20 0420  AST 26 45*  ALT 19 33  ALKPHOS 48 47  BILITOT 0.9 0.9  PROT 7.5 6.6  ALBUMIN 2.8* 2.6*    No results for input(s): LIPASE, AMYLASE in the last 168 hours. No results for input(s): AMMONIA in the last 168 hours. Cardiac Enzymes: No results for input(s): CKTOTAL, CKMB, CKMBINDEX, TROPONINI in the last 168 hours. BNP (last 3 results) Recent Labs    03/08/20 1309 06/12/20 0954 09/12/20 1830  BNP 824.4* 774.5* 1,139.8*     ProBNP (last 3 results) No results for input(s): PROBNP in the last 8760 hours.  CBG: Recent Labs  Lab 09/17/20 2035 09/17/20 2337 09/18/20 0416 09/18/20 0804 09/18/20  0959  GLUCAP 195* 192* 120* 138* 117*    Recent Results (from the past 240 hour(s))  Resp Panel by RT-PCR (Flu A&B, Covid) Nasopharyngeal Swab     Status: None   Collection Time: 09/12/20  6:30 PM   Specimen: Nasopharyngeal Swab; Nasopharyngeal(NP) swabs in vial transport medium  Result Value Ref Range Status   SARS Coronavirus 2 by RT PCR NEGATIVE NEGATIVE Final    Comment: (NOTE) SARS-CoV-2 target nucleic acids are NOT DETECTED.  The SARS-CoV-2 RNA is generally detectable in upper respiratory specimens during the acute phase of infection. The lowest concentration of SARS-CoV-2 viral copies this assay can detect is 138 copies/mL. A negative result does not preclude SARS-Cov-2 infection and should not be used as the sole basis for treatment or other patient management decisions. A negative result may occur with  improper specimen collection/handling, submission of specimen other than nasopharyngeal swab, presence of viral mutation(s) within the areas targeted by this assay, and inadequate number of viral copies(<138 copies/mL). A negative result must be combined with clinical observations, patient history, and epidemiological information. The expected result is Negative.  Fact Sheet for Patients:   BloggerCourse.com  Fact Sheet for Healthcare Providers:  SeriousBroker.it  This test is no t yet approved or cleared by the [place] FDA and  has been authorized for detection and/or diagnosis of SARS-CoV-2 by FDA under an Emergency Use Authorization (EUA). This EUA will remain  in effect (meaning this test can be used) for the duration of the COVID-19 declaration under Section 564(b)(1) of the Act, 21 U.S.C.section 360bbb-3(b)(1), unless the authorization is terminated  or revoked sooner.       Influenza A by PCR NEGATIVE NEGATIVE Final   Influenza B by PCR NEGATIVE NEGATIVE Final    Comment: (NOTE) The Xpert Xpress SARS-CoV-2/FLU/RSV plus assay is intended as an aid in the diagnosis of influenza from Nasopharyngeal swab specimens and should not be used as a sole basis for treatment. Nasal washings and aspirates are unacceptable for Xpert Xpress SARS-CoV-2/FLU/RSV testing.  Fact Sheet for Patients: BloggerCourse.com  Fact Sheet for Healthcare Providers: SeriousBroker.it  This test is not yet approved or cleared by the [place] FDA and has been authorized for detection and/or diagnosis of SARS-CoV-2 by FDA under an Emergency Use Authorization (EUA). This EUA will remain in effect (meaning this test can be used) for the duration of the COVID-19 declaration under Section 564(b)(1) of the Act, 21 U.S.C. section 360bbb-3(b)(1), unless the authorization is terminated or revoked.  Performed at [facility], [address], [place], [place] [address]       Studies: No results found.   Hughie Closs, MD  [facility] 09/18/2020  If 7PM-7AM, please contact night-coverage

## 2020-09-18 NOTE — Op Note (Addendum)
PRE-OPERATIVE DIAGNOSIS: ITP  POST-OPERATIVE DIAGNOSIS:  Same  PROCEDURE:  Procedure(s): Hand-assisted laparoscopic splenectomy  SURGEON:  Surgeon(s): Almond Lint, MD  ASSISTANT: Aquilla Solian, MD  ANESTHESIA:   local and general  DRAINS: 24F blake drain in the subdiaphragmatic space, exiting the LLQ  LOCAL MEDICATIONS USED:  BUPIVICAINE and EXPAREL  SPECIMEN:  Source of Specimen:  spleen  DISPOSITION OF SPECIMEN:  PATHOLOGY  COUNTS:  YES  PLAN OF CARE: Admit to inpatient   PATIENT DISPOSITION:  PACU - hemodynamically stable.  FINDINGS:  moderately enlarged spleen  EBL: 150cc  PROCEDURE:   Patient was identified in the holding area and taken to the operating room where she was placed supine on operating room table. General anesthesia was induced. Foley catheter was placed. Patient was then turned to the right lateral position with appropriate padding in the axilla and on bony surfaces. Patient's abdomen was prepped and draped in sterile fashion.  A timeout was performed according to the surgical safety checklist. When all was correct, we continued.  A subxiphoid 10cm midline incision was made with a #15 blade for placement of the hand port. The subcutaneous tissues were divided with electrocautery. The fascia was entered sharply and extended to the length of the incision. A gel port was then placed with a 12mm trocar. Pneumoperitoneum was achieved to a pressure of 15 mmHg.  Three 5 mm trocars were placed in the supraumbilical midline region, and two in the left lower quadrant after administration of local anesthetic. The omentum was stuck down to the spleen and this was taken down with the harmonic. The short gastrics were taken down with the harmonic. The lienocolic ligament was taken down with the harmonic. The hilum was identified and gently the posterior attachments were taken down.   The articulating echelon stapler was used to divide the hilar vessels. The posterior  attachments of the spleen were then taken down. The proximal attachments were quite resistant because of the weight of the spleen. The spleen was extracted through the gel port site. The spleen was then passed off the table. The left upper quadrant was then irrigated copiously.  The area was hemostatic.  A 24Fr blake drain was placed in the left upper quadrant in the splenic fossa. The drain was tunneled out through the left lower quadrant port site and secured to the skin.   The subxiphoid midline incision was then closed in 2 layers with 0-0 Vicryl for the peritoneum and the fascia closed with figure-of-eight #1 Novofil sutures. The skin of this incision was closed with interrupted 3-0 Vicryl deep dermal sutures and 4-0 Monocryl running subcuticular suture. The skin of the remaining trocar sites was closed with 4-0 Monocryl and Dermabond as well.  The patient was allowed to emerge from anesthesia and was taken to the PACU in stable condition. Needle, sponge, and instrument counts were correct 2.   I was present for the entire surgery and performed the key portions of the surgery. I have reviewed and agree with the operative note as documented by the resident.

## 2020-09-18 NOTE — Progress Notes (Signed)
Report given that the blood order was place by mistake told RN  to   disregard order. Will continue to monitor.

## 2020-09-18 NOTE — Interval H&P Note (Signed)
History and Physical Interval Note:  09/18/2020 11:13 AM  [name]  has presented today for surgery, with the diagnosis of IMMUNE THROMBOCYTOPENIC PURPURA.  The various methods of treatment have been discussed with the patient and family. After consideration of risks, benefits and other options for treatment, the patient has consented to  Procedure(s) with comments: HAND ASSISTED LAPAROSCOPIC SPLENECTOMY (N/A) - 120 MINUTES as a surgical intervention.  The patient's history has been reviewed, patient examined, no change in status, stable for surgery.  I have reviewed the patient's chart and labs.  Questions were answered to the patient's satisfaction.     Almond Lint

## 2020-09-18 NOTE — Anesthesia Procedure Notes (Signed)
Arterial Line Insertion Start/End9/15/2022 11:55 AM, 09/18/2020 12:00 PM Performed by: Dorris Singh, MD, Marny Lowenstein, CRNA  Patient location: OR. Preanesthetic checklist: patient identified, IV checked and monitors and equipment checked Left, radial was placed Catheter size: 20 G Hand hygiene performed   Attempts: 1 Procedure performed without using ultrasound guided technique. Following insertion, dressing applied and Biopatch. Post procedure assessment: normal

## 2020-09-19 ENCOUNTER — Encounter (HOSPITAL_COMMUNITY): Payer: Self-pay | Admitting: General Surgery

## 2020-09-19 ENCOUNTER — Other Ambulatory Visit (HOSPITAL_COMMUNITY): Payer: Self-pay

## 2020-09-19 DIAGNOSIS — D649 Anemia, unspecified: Secondary | ICD-10-CM | POA: Diagnosis not present

## 2020-09-19 LAB — CBC WITH DIFFERENTIAL/PLATELET
Abs Immature Granulocytes: 0 10*3/uL (ref 0.00–0.07)
Basophils Absolute: 0 10*3/uL (ref 0.0–0.1)
Basophils Relative: 0 %
Eosinophils Absolute: 0 10*3/uL (ref 0.0–0.5)
Eosinophils Relative: 0 %
HCT: 26.1 % — ABNORMAL LOW (ref 36.0–46.0)
Hemoglobin: 7.4 g/dL — ABNORMAL LOW (ref 12.0–15.0)
Lymphocytes Relative: 0 %
Lymphs Abs: 0 10*3/uL — ABNORMAL LOW (ref 0.7–4.0)
MCH: 24.5 pg — ABNORMAL LOW (ref 26.0–34.0)
MCHC: 28.4 g/dL — ABNORMAL LOW (ref 30.0–36.0)
MCV: 86.4 fL (ref 80.0–100.0)
Monocytes Absolute: 0.7 10*3/uL (ref 0.1–1.0)
Monocytes Relative: 4 %
Neutro Abs: 17.1 10*3/uL — ABNORMAL HIGH (ref 1.7–7.7)
Neutrophils Relative %: 96 %
Platelets: 265 10*3/uL (ref 150–400)
RBC: 3.02 MIL/uL — ABNORMAL LOW (ref 3.87–5.11)
RDW: 22.8 % — ABNORMAL HIGH (ref 11.5–15.5)
WBC: 17.8 10*3/uL — ABNORMAL HIGH (ref 4.0–10.5)
nRBC: 0.4 % — ABNORMAL HIGH (ref 0.0–0.2)
nRBC: 1 /100 WBC — ABNORMAL HIGH

## 2020-09-19 LAB — GLUCOSE, CAPILLARY
Glucose-Capillary: 162 mg/dL — ABNORMAL HIGH (ref 70–99)
Glucose-Capillary: 167 mg/dL — ABNORMAL HIGH (ref 70–99)
Glucose-Capillary: 174 mg/dL — ABNORMAL HIGH (ref 70–99)
Glucose-Capillary: 177 mg/dL — ABNORMAL HIGH (ref 70–99)
Glucose-Capillary: 181 mg/dL — ABNORMAL HIGH (ref 70–99)

## 2020-09-19 LAB — COMPREHENSIVE METABOLIC PANEL
ALT: 39 U/L (ref 0–44)
AST: 54 U/L — ABNORMAL HIGH (ref 15–41)
Albumin: 2.5 g/dL — ABNORMAL LOW (ref 3.5–5.0)
Alkaline Phosphatase: 34 U/L — ABNORMAL LOW (ref 38–126)
Anion gap: 11 (ref 5–15)
BUN: 36 mg/dL — ABNORMAL HIGH (ref 6–20)
CO2: 26 mmol/L (ref 22–32)
Calcium: 7.6 mg/dL — ABNORMAL LOW (ref 8.9–10.3)
Chloride: 99 mmol/L (ref 98–111)
Creatinine, Ser: 1.28 mg/dL — ABNORMAL HIGH (ref 0.44–1.00)
GFR, Estimated: 50 mL/min — ABNORMAL LOW (ref >60)
Glucose, Bld: 206 mg/dL — ABNORMAL HIGH (ref 70–99)
Potassium: 4.6 mmol/L (ref 3.5–5.1)
Sodium: 136 mmol/L (ref 135–145)
Total Bilirubin: 1 mg/dL (ref 0.3–1.2)
Total Protein: 5.8 g/dL — ABNORMAL LOW (ref 6.5–8.1)

## 2020-09-19 MED ORDER — CHLORHEXIDINE GLUCONATE CLOTH 2 % EX PADS
6.0000 | MEDICATED_PAD | Freq: Every day | CUTANEOUS | Status: DC
  Administered 2020-09-19 – 2020-09-27 (×10): 6 via TOPICAL

## 2020-09-19 NOTE — Consult Note (Signed)
   [facility] CM Inpatient Consult   09/19/2020  SHERE EISENHART Apr 13, 1965 [id number]    Valinda Hoar [facility] Medicare  Patient was discussed in unit progression meeting for high risk score  Primary Care Provider:  Annita Brod, MD, not a [facility] provider   Reason:  Not a beneficiary currently attributed to one of the [facility] ACO Registry populations with a primary care provider is not an in network provider at this time for [facility] care management. Membership roster was used to verify patient status with a different provider.  However, patient endorses Dr. Darleene Cleaver as her PCP. Spoke with patient at the bedside, "he's my primary care and makes a visit once a month."  For questions, please contact:  Charlesetta Shanks, RN [name] CCM [facility]  [phone] business mobile phone Toll free office [phone]  Fax number: [phone] [email or web address]

## 2020-09-19 NOTE — Anesthesia Postprocedure Evaluation (Signed)
Anesthesia Post Note  Patient: [name]  Procedure(s) Performed: HAND ASSISTED LAPAROSCOPIC SPLENECTOMY     Patient location during evaluation: PACU Anesthesia Type: General Level of consciousness: awake and alert Pain management: pain level controlled Vital Signs Assessment: post-procedure vital signs reviewed and stable Respiratory status: spontaneous breathing, nonlabored ventilation, respiratory function stable and patient connected to nasal cannula oxygen Cardiovascular status: blood pressure returned to baseline and stable Postop Assessment: no apparent nausea or vomiting Anesthetic complications: no   No notable events documented.  Last Vitals:  Vitals:   09/18/20 1921 09/19/20 0400  BP: 135/70 118/65  Pulse: 62 65  Resp: 16 14  Temp: (!) 36.3 C 37 C  SpO2: 98% 98%    Last Pain:  Vitals:   09/19/20 0400  TempSrc: Oral  PainSc:                  [name]

## 2020-09-19 NOTE — TOC Initial Note (Signed)
Transition of Care [facility]) - Initial/Assessment Note    Patient Details  Name: [name] MRN: [id number] Date of Birth: July 24, 1965  [facility] CM/SW Contact:    Tresa Endo Phone Number: 09/19/2020, 5:26 PM  Clinical Narrative:                 CSW received SNF consult. CSW met with pt, CSW introduced self and explained role at the hospital. Pt reports that PTA the pt was living at home with son. PT reports py is min assist but 2 person up to RW.   CSW reviewed PT/OT recommendations for SNF. Pt reports not wanting to go to an inpatient facility and would like to [facility] with [facility]. [facility] the [facility] and would like to [facility] with [facility]. Pt also shared that she has DME at home.  CSW will sign off and update NCM.          Patient Goals and CMS Choice        Expected Discharge Plan and Services                                                Prior Living Arrangements/Services                       Activities of Daily Living Home Assistive Devices/Equipment: Wheelchair, Oxygen, CBG Meter, Bedside commode/3-in-1 ADL Screening (condition at time of admission) Patient's cognitive ability adequate to safely complete daily activities?: Yes Is the patient deaf or have difficulty hearing?: No Does the patient have difficulty seeing, even when wearing glasses/contacts?: No Does the patient have difficulty concentrating, remembering, or making decisions?: No Patient able to express need for assistance with ADLs?: Yes Does the patient have difficulty dressing or bathing?: Yes Independently performs ADLs?: No Does the patient have difficulty walking or climbing stairs?: Yes Weakness of Legs: Both Weakness of Arms/Hands: Both  Permission Sought/Granted                  Emotional Assessment              Admission diagnosis:  Hyperkalemia [E87.5] Leg edema  [R60.0] Thrombocytopenia (HCC) [D69.6] Idiopathic thrombocytopenic purpura (ITP) (HCC) [D69.3] Hypoxia [R09.02] Symptomatic anemia [D64.9] Acute on chronic congestive heart failure, unspecified heart failure type ([place]) [I50.9] Patient Active Problem List   Diagnosis Date Noted   Pancytopenia ([place]) 09/12/2020   Chronic respiratory failure with hypoxia ([place])    Chronic ITP (idiopathic thrombocytopenia) ([place]) 03/08/2020   Acute on chronic respiratory failure with hypoxia ([place]) 03/08/2020   Cellulitis of foot 08/02/2019   Nausea and vomiting 06/26/2019   Hematuria 06/26/2019   Hypertensive urgency 06/26/2019   Acute on chronic diastolic CHF (congestive heart failure) ([place])    CHF (congestive heart failure) ([place]) 04/11/2019   Acute exacerbation of CHF (congestive heart failure) ([place]) 04/11/2019   Leukopenia 04/11/2019   History of ITP 04/11/2019   Idiopathic thrombocytopenic purpura (ITP) ([place]) 11/16/2018   Onychomycosis of toenail 10/16/2018   Cataract of both eyes 10/16/2018   Tinea pedis of left foot 10/16/2018   Decreased vision in both eyes 10/16/2018   CKD (chronic kidney disease) stage 3, GFR 30-59 ml/min ([place])    Cellulitis of left lower extremity 01/25/2018  Hyperkalemia 01/25/2018   Poor compliance 03/08/2017   Hypoxia 03/07/2017   Thyroid nodule 03/07/2017   Chronic indwelling Foley catheter 03/04/2017   Type II diabetes mellitus ([place])    Femur fracture ([place]) 03/02/2017   Orthostatic dizziness    Urinary tract infection without hematuria    Enterococcus faecalis infection    Near syncope 11/25/2015   Dehydration 11/25/2015   Orthostatic hypotension 11/25/2015   UTI (urinary tract infection) 11/25/2015   Acute worsening of stage 3 chronic kidney disease ([place]) 09/24/2015   Diabetes mellitus due to underlying condition with chronic kidney disease, without long-term current use of insulin (HCC)    Fever    Status post below knee amputation of right lower extremity  (HCC)    Diabetic peripheral neuropathy (HCC)    Neuropathic pain    Benign essential HTN    Folliculitis    Slow transit constipation    Anemia of chronic disease    Bilateral hydronephrosis    Urinary retention    CKD (chronic kidney disease)    Uncontrolled type 2 diabetes mellitus with diabetic nephropathy, without long-term current use of insulin ([place])    Vasculopathy    Debility 09/04/2015   DM type 2 with diabetic peripheral neuropathy (HCC)    S/P BKA (below knee amputation) unilateral (HCC)    Medical non-compliance    Benign skin lesion of multiple sites    Tachypnea    Acute blood loss anemia    Neurogenic bladder    Occult blood positive stool    Rectovaginal fistula    Controlled diabetes mellitus type 2 with complications ([place])    AKI (acute kidney injury) ([place])    Hydronephrosis determined by ultrasound    Papular rash    Uncontrolled type 2 diabetes mellitus with complication (HCC)    Paresthesia    Thrombocytopenia ([place]) 08/23/2015   Pressure ulcer 08/23/2015   Severe anemia 08/23/2015   Lower urinary tract infectious disease 08/23/2015   Symptomatic anemia 08/23/2015   Peripheral vascular disease, unspecified ([place]) 04/27/2012   Unilateral complete BKA ([place]) 07/09/2011   Gas gangrene ([place]) 07/02/2011   Sepsis(995.91) 07/02/2011   Acute kidney injury superimposed on chronic kidney disease ([place]) 07/02/2011   Diabetes mellitus ([place]) 11/22/2010   Diabetic foot ulcer associated with type 2 diabetes mellitus ([place])    Essential hypertension    Arthritis    Neuropathy ([place])    Intracerebral hemorrhage ([place])    PCP:  Clovia Cuff, MD Pharmacy:   [place], [place] - [address] [place] [address] Phone: [phone] Fax: [phone]     Social Determinants of Health (SDOH) Interventions Food Insecurity Interventions: Intervention Not Indicated Financial Strain Interventions: Intervention Not  Indicated Housing Interventions: Intervention Not Indicated Transportation Interventions: [facility]  Readmission Risk Interventions Readmission Risk Prevention Plan 03/11/2020 04/13/2019 11/13/2018  Transportation Screening Complete Complete Complete  PCP or Specialist Appt within 5-7 Days - - Complete  PCP or Specialist Appt within 3-5 Days Complete Complete -  Home Care Screening - - Complete  Medication Review (RN CM) - - Complete  HRI or Home Care Consult Complete Complete -  Social Work Consult for Recovery Care Planning/Counseling Complete Complete -  Palliative Care Screening Not Applicable Not Applicable -  Medication Review Press photographer) Complete Complete -  Some recent data might be hidden

## 2020-09-19 NOTE — Evaluation (Signed)
Occupational Therapy Evaluation Patient Details Name: [name] MRN: [id number] DOB: 1965/06/15 Today's Date: 09/19/2020   History of Present Illness [name] is a 55 y.o. female with medical history significant for ITP, type 2 diabetes mellitus, chronic kidney disease, chronic diastolic CHF, chronic hypoxic respiratory failure, history, presenting 09/12/20 with shortness of breath, fatigue, and bruising.   Clinical Impression   Patient admitted for the diagnosis above.  Now s/p Hand-assisted laparoscopic splenectomy with a drain in place.  PTA she  was in the process of receiving her prosthetic leg, and beginning PT for mobility training.  Patient states she was able to transfer to her wheelchair, more help from her son recently for transfers.  Performed sink baths at wheelchair level.  Deficits impacting independence are listed below.  Currently she is needing up to Mod A for lower body ADL bed level, and up to Mod A for simple squat pivot transfers.  Patient needs post acute rehab prior to returning home, SNF is recommended to ensure a safe transition home. Patient only has occasional assist, but needs 24 hour moderate assist.  OT to follow in the acute setting to maximize her functional status.       Recommendations for follow up therapy are one component of a multi-disciplinary discharge planning process, led by the attending physician.  Recommendations may be updated based on patient status, additional functional criteria and insurance authorization.   Follow Up Recommendations  SNF    Equipment Recommendations  3 in 1 bedside commode;Tub/shower bench    Recommendations for Other Services       Precautions / Restrictions Precautions Precautions: Fall Precaution Comments: edema in LEs, unable to don prosthesis, hx of R BKA Restrictions Weight Bearing Restrictions: No      Mobility Bed Mobility Overal bed mobility: Needs Assistance Bed Mobility: Supine to Sit      Supine to sit: Min assist       Patient Response: Anxious  Transfers Overall transfer level: Needs assistance   Transfers: Sit to/from [employer] Transfers Sit to Stand: Mod assist;From elevated surface   Squat pivot transfers: Mod assist;From elevated surface          Balance Overall balance assessment: Needs assistance Sitting-balance support: Feet supported;No upper extremity supported Sitting balance-Leahy Scale: Fair     Standing balance support: Bilateral upper extremity supported Standing balance-Leahy Scale: Poor Standing balance comment: reliant on external support                           ADL either performed or assessed with clinical judgement   ADL Overall ADL's : Needs assistance/impaired Eating/Feeding: Independent;Sitting   Grooming: Wash/dry hands;Wash/dry face;Set up;Sitting   Upper Body Bathing: Supervision/ safety;Sitting   Lower Body Bathing: Minimal assistance;Bed level   Upper Body Dressing : Set up;Sitting   Lower Body Dressing: Minimal assistance;Bed level   Toilet Transfer: Moderate assistance;Squat-pivot;BSC           Functional mobility during ADLs: Moderate assistance;+2 for physical assistance General ADL Comments: +2 from lower surface     Vision Baseline Vision/History: 1 Wears glasses Patient Visual Report: No change from baseline Additional Comments: no reading glasses     Perception     Praxis      Pertinent Vitals/Pain Pain Assessment: No/denies pain     Hand Dominance Right   Extremity/Trunk Assessment Upper Extremity Assessment Upper Extremity Assessment: Generalized weakness   Lower Extremity Assessment Lower Extremity Assessment: Defer  to PT evaluation   Cervical / Trunk Assessment Cervical / Trunk Assessment: Kyphotic   Communication Communication Communication: No difficulties   Cognition Arousal/Alertness: Awake/alert Behavior During Therapy: Anxious Overall Cognitive  Status: Impaired/Different from baseline Area of Impairment: Safety/judgement;Awareness;Following commands;Memory;Attention                   Current Attention Level: Sustained Memory: Decreased short-term memory Following Commands: Follows one step commands inconsistently;Follows one step commands with increased time Safety/Judgement: Decreased awareness of safety;Decreased awareness of deficits Awareness: Emergent       General Comments       Exercises     Shoulder Instructions      Home Living Family/patient expects to be discharged to:: Private residence Living Arrangements: Children Available Help at Discharge: Family;Available PRN/intermittently Type of Home: House Home Access: Stairs to enter [employer] of Steps: 1 Entrance Stairs-Rails: None Home Layout: One level     Bathroom Shower/Tub: Other (comment) (patient bathes w/c level in front of the sink)   Bathroom Toilet: Handicapped height     Home Equipment: Bedside commode;Walker - 2 wheels;Wheelchair - manual;Hospital bed          Prior Functioning/Environment Level of Independence: Needs assistance  Gait / Transfers Assistance Needed: uses w/c primarily because she can't get prosthesis on due to swelling ADL's / Homemaking Assistance Needed: son helps with transfers as of recent onto commode, bath and dresses self, son drives and grocery shops            OT Problem List: Decreased strength;Decreased activity tolerance;Impaired balance (sitting and/or standing);Decreased knowledge of use of DME or AE;Decreased safety awareness      OT Treatment/Interventions: Self-care/ADL training;DME and/or AE instruction;Balance training;Therapeutic activities;Cognitive remediation/compensation    OT Goals(Current goals can be found in the care plan section) Acute Rehab OT Goals Patient Stated Goal: I want to be independent OT Goal Formulation: With patient Time For Goal Achievement:  10/03/20 Potential to Achieve Goals: Good ADL Goals Pt Will Perform Grooming: with set-up;sitting Pt Will Perform Lower Body Bathing: with supervision;sit to/from stand Pt Will Perform Lower Body Dressing: with supervision;sit to/from stand Pt Will Transfer to Toilet: with supervision;squat pivot transfer;stand pivot transfer;bedside commode  OT Frequency: Min 2X/week   Barriers to D/C:    none noted       Co-evaluation              AM-PAC OT "6 Clicks" Daily Activity     Outcome Measure Help from another person eating meals?: None Help from another person taking care of personal grooming?: None Help from another person toileting, which includes using toliet, bedpan, or urinal?: A Lot Help from another person bathing (including washing, rinsing, drying)?: A Lot Help from another person to put on and taking off regular upper body clothing?: A Little Help from another person to put on and taking off regular lower body clothing?: A Lot 6 Click Score: 17   End of Session Equipment Utilized During Treatment: Oxygen Nurse Communication: Mobility status  Activity Tolerance: Patient tolerated treatment well Patient left: in chair;with call bell/phone within reach;with chair alarm set  OT Visit Diagnosis: Unsteadiness on feet (R26.81);Muscle weakness (generalized) (M62.81);Other symptoms and signs involving cognitive function                Time: 1407-1440 OT Time Calculation (min): 33 min Charges:  OT General Charges $OT Visit: 1 Visit OT Evaluation $OT Eval Moderate Complexity: 1 Mod OT Treatments $Self Care/Home Management : 8-22  mins  09/19/2020  RP, OTR/L  Acute Rehabilitation Services  Office:  [phone]   Suzanna Obey 09/19/2020, 2:50 PM

## 2020-09-19 NOTE — Evaluation (Signed)
Physical Therapy Re-Evaluation Patient Details Name: [name] MRN: [id number] DOB: July 04, 1965 Today's Date: 09/19/2020  History of Present Illness  [name] is a 55 y.o. female with medical history significant for ITP, type 2 diabetes mellitus, chronic kidney disease, chronic diastolic CHF, chronic hypoxic respiratory failure, history, presenting 09/12/20 with shortness of breath, fatigue, and bruising. S/p hand-assisted laparoscopic splenectomy 6/15.   Clinical Impression  Pt now presents s/p hand-assisted laparoscopic splenectomy. She continues to display deficits in cognition that impact her safety awareness and sequencing of tasks. In addition, she continues to display deficits in balance, in activity tolerance, and lower extremity weakness with functional mobility. She is at high risk for falls. Currently, she is requiring modA to come to a partial stand and squat pivot between surfaces. She could benefit from a sliding board along with transfer training with it when she goes home provided her w/c and bedside commode have removable arm rests. She would greatly benefit from a short-term rehab stay at a SNF to maximize her independence and safety and thus decrease burden of care as her son is in school and is only available to assist her intermittently. Will continue to follow acutely.     Recommendations for follow up therapy are one component of a multi-disciplinary discharge planning process, led by the attending physician.  Recommendations may be updated based on patient status, additional functional criteria and insurance authorization.  Follow Up Recommendations SNF;Supervision/Assistance - 24 hour    Equipment Recommendations  Other (comment) (sliding board)    Recommendations for Other Services       Precautions / Restrictions Precautions Precautions: Fall Precaution Comments: L JP drain, edema in LEs, unable to don prosthesis, hx of R BKA Restrictions Weight  Bearing Restrictions: No      Mobility  Bed Mobility Overal bed mobility: Needs Assistance Bed Mobility: Sit to Supine     Supine to sit: Min assist Sit to supine: Min guard;HOB elevated   General bed mobility comments: Extra time and cues to scoot hips laterally to transition sit > supine to elevated HOB.    Transfers Overall transfer level: Needs assistance Equipment used: 1 person hand held assist Transfers: Sit to/from [employer] Sit to Stand: Mod assist   Squat pivot transfers: Mod assist     General transfer comment: Sit to stand from recliner with pt initiating scoot to edge without needing cues, modA to boost and L knee block for safety with transfer to stand, came to ~75% stand position. ModA to steady and direct pt's towards the R with squat pivot to R recliner > bed.  Ambulation/Gait             General Gait Details: Deferred this date  Stairs            Wheelchair Mobility    Modified Rankin (Stroke Patients Only)       Balance Overall balance assessment: Needs assistance Sitting-balance support: Feet supported;No upper extremity supported Sitting balance-Leahy Scale: Fair Sitting balance - Comments: Min guard for safety sitting statically EOB.   Standing balance support: Bilateral upper extremity supported Standing balance-Leahy Scale: Poor Standing balance comment: reliant on UE and external physcial support                             Pertinent Vitals/Pain Pain Assessment: Faces Faces Pain Scale: Hurts little more Pain Location: abdomen Pain Descriptors / Indicators: Discomfort Pain Intervention(s): Limited activity within  patient's tolerance;Monitored during session;Repositioned    Home Living Family/patient expects to be discharged to:: Private residence Living Arrangements: Children (son) Available Help at Discharge: Family;Available PRN/intermittently (son, who goes to school) Type of Home:  House Home Access: Stairs to enter Entrance Stairs-Rails: None Entrance Stairs-Number of Steps: 1 Home Layout: One level Home Equipment: Bedside commode;Walker - 2 wheels;Wheelchair - manual;Hospital bed      Prior Function Level of Independence: Needs assistance   Gait / Transfers Assistance Needed: uses w/c primarily because she can't get prosthesis on due to swelling  ADL's / Homemaking Assistance Needed: son helps with transfers as of recent onto commode, bath and dresses self, son drives and grocery shops        Hand Dominance   Dominant Hand: Right    Extremity/Trunk Assessment   Upper Extremity Assessment Upper Extremity Assessment: Defer to OT evaluation    Lower Extremity Assessment Lower Extremity Assessment: Generalized weakness;RLE deficits/detail;LLE deficits/detail RLE Deficits / Details: R BKA hx, reports sensation deficits distally RLE Sensation: decreased light touch LLE Deficits / Details: Weakness noted with functional mobility LLE Sensation: history of peripheral neuropathy LLE Coordination: decreased gross motor    Cervical / Trunk Assessment Cervical / Trunk Assessment: Kyphotic  Communication   Communication: No difficulties  Cognition Arousal/Alertness: Awake/alert Behavior During Therapy: Anxious Overall Cognitive Status: Impaired/Different from baseline Area of Impairment: Safety/judgement;Awareness;Following commands;Memory;Attention                   Current Attention Level: Sustained Memory: Decreased short-term memory Following Commands: Follows one step commands inconsistently;Follows one step commands with increased time Safety/Judgement: Decreased awareness of safety;Decreased awareness of deficits Awareness: Emergent   General Comments: Pt easily distracted and needing redirection often. needs repeated reminders for sequencing and maintaining safety with tasks.      General Comments      Exercises      Assessment/Plan    PT Assessment Patient needs continued PT services  PT Problem List Decreased strength;Decreased activity tolerance;Decreased balance;Decreased mobility;Decreased coordination;Decreased cognition;Decreased knowledge of use of DME;Decreased safety awareness;Impaired sensation;Decreased skin integrity;Pain       PT Treatment Interventions DME instruction;Gait training;Functional mobility training;Therapeutic activities;Therapeutic exercise;Balance training;Neuromuscular re-education;Cognitive remediation;Patient/family education;Wheelchair mobility training    PT Goals (Current goals can be found in the Care Plan section)  Acute Rehab PT Goals Patient Stated Goal: to get better PT Goal Formulation: With patient Time For Goal Achievement: 09/29/20 Potential to Achieve Goals: Good    Frequency Min 2X/week   Barriers to discharge Decreased caregiver support son lives with her but she states he goes to school    Co-evaluation               AM-PAC PT "6 Clicks" Mobility  Outcome Measure Help needed turning from your back to your side while in a flat bed without using bedrails?: A Little Help needed moving from lying on your back to sitting on the side of a flat bed without using bedrails?: A Little Help needed moving to and from a bed to a chair (including a wheelchair)?: A Lot Help needed standing up from a chair using your arms (e.g., wheelchair or bedside chair)?: A Lot Help needed to walk in hospital room?: Total Help needed climbing 3-5 steps with a railing? : Total 6 Click Score: 12    End of Session Equipment Utilized During Treatment: Gait belt Activity Tolerance: Patient tolerated treatment well Patient left: in bed;with call bell/phone within reach;with bed alarm set;with SCD's reapplied  PT Visit Diagnosis: Unsteadiness on feet (R26.81);Muscle weakness (generalized) (M62.81);Difficulty in walking, not elsewhere classified (R26.2)    Time:  3762-8315 PT Time Calculation (min) (ACUTE ONLY): 19 min   Charges:   PT Evaluation $PT Re-evaluation: 1 Re-eval          Raymond Gurney, PT, DPT Acute Rehabilitation Services  Pager: [phone] Office: [phone]   Jewel Baize 09/19/2020, 5:40 PM

## 2020-09-19 NOTE — Progress Notes (Signed)
PROGRESS NOTE  [name] PFX:[id number] DOB: 1965-10-22 DOA: 09/12/2020 PCP: Annita Brod, MD   LOS: 7 days   Brief narrative:  [name] is a 55 y.o. female with medical history significant for ITP, type 2 diabetes mellitus, chronic kidney disease, chronic diastolic CHF, chronic hypoxic respiratory failure, presented to the hospital with shortness of breath, fatigue, and bruising for one month and she is now unable to get up without assistance.  Patient stated that she was out of Lasix for 1 month.  Patient was supposed to be following up with hem-oncology but had been busy and was not able to follow-up with him.  In the ED, patient was hemodynamically stable but hemoglobin was 4.5, platelet less than 5.  WBC 2.8.  Patient had mild hyperkalemia with elevated creatinine of 1.8.  Chest x-ray showed cardiomegaly with small pleural effusion and pulmonary edema.  In the ED, patient was treated with IV Lasix, Protonix, dexamethasone, and Lokelma. 2 units PRBC were ordered and was admitted to the hospital.   Assessment/Plan:  Principal Problem:   Symptomatic anemia Active Problems:   Type II diabetes mellitus (HCC)   CKD (chronic kidney disease) stage 3, GFR 30-59 ml/min (HCC)   Idiopathic thrombocytopenic purpura (ITP) (HCC)   Acute on chronic diastolic CHF (congestive heart failure) (HCC)   Pancytopenia (HCC)  Idiopathic thrombocytopenic purpura; pancytopenia; symptomatic anemia  Presented with platelet of < 5. patient received IV steroids, received Nplate and IVIG.  Oncology on board.  Received 2 units of packed RBC during hospitalization..received Aranesp and Feraheme on 09/15/2020. continue folic acid.  Patient has received pneumococcal meningococcal and haemophilus vaccination.  Platelets improved and 174 today with oncology recommended splenectomy.  General surgery consulted.  Patient underwent a splenectomy on 09/18/2020.  Doing well postoperatively.  General surgery on board  and we appreciate their help.  Hypokalemia: Resolved.  Essential hypertension: Controlled.  Continue amlodipine to 10 mg and continue current dose of Imdur and Toprol-XL.  Acute on chronic diastolic CHF with chronic hypoxic respiratory failure  Patient was out of her Lasix at home.  Continue Lasix 40 mg p.o. twice daily for now.  Could change back to 40 daily home dose on discharge continue strict intake and output charting, monitor electrolytes.  Monitor renal function.  Improving creatinine.   CKD IIIb - SCr is 1.83 on admission, up from 1.51 in June 2022.  Creatinine 1.12 today.  Monitor closely.   Type II DM with hyperglycemia Hemoglobin A1c was 6.2% in June 2022.  Blood sugar controlled on SSI.  Continue this.  Debility, deconditioning, ambulatory dysfunction. States that she has DME wheelchair at home for ambulation, physical therapy evaluation prior to discharge. Lives with son at home.   DVT prophylaxis: SCDs Start: 09/12/20 1951  Code Status: Full code  Family Communication: None  Status is: Inpatient  Remains inpatient appropriate because:IV treatments appropriate due to intensity of illness or inability to take PO and Inpatient level of care appropriate due to severity of illness  Dispo: The patient is from: Home              Anticipated d/c is to: Home               Patient currently is not medically stable to d/c.   Difficult to place patient No  Consultants: Medical oncology General surgery  Procedures: PRBC transfusion III units.  Anti-infectives:  None  Anti-infectives (From admission, onward)    Start     Dose/Rate  Route Frequency Ordered Stop   09/18/20 1115  cefoTEtan (CEFOTAN) 2 g in sodium chloride 0.9 % 100 mL IVPB        2 g 200 mL/hr over 30 Minutes Intravenous  Once 09/18/20 1112 09/18/20 1250   09/18/20 1114  sodium chloride 0.9 % with cefoTEtan (CEFOTAN) ADS Med       Note to Pharmacy: Janene Harvey   : cabinet override      09/18/20 1114  09/18/20 1253      Subjective: Seen and examined.  Doing well.  Minimal postoperative pain as expected.   Objective: Vitals:   09/19/20 0905 09/19/20 1219  BP: 129/75 (!) 110/53  Pulse: 70 66  Resp: 18 13  Temp: 97.9 F (36.6 C) (!) 97.5 F (36.4 C)  SpO2: 93% 98%    Intake/Output Summary (Last 24 hours) at 09/19/2020 1302 Last data filed at 09/19/2020 0403 Gross per 24 hour  Intake 1100 ml  Output 2530 ml  Net -1430 ml    Filed Weights   09/17/20 0415 09/18/20 0425 09/19/20 0013  Weight: 107.6 kg 106.8 kg 105.7 kg   Body mass index is 36.5 kg/m.   Physical Exam:  General exam: Appears calm and comfortable  Respiratory system: Clear to auscultation. Respiratory effort normal. Cardiovascular system: S1 & S2 heard, RRR. No JVD, murmurs, rubs, gallops or clicks. No pedal edema. Gastrointestinal system: Abdomen is nondistended, soft and mild generalized abdominal tenderness.  JP drain in place. No organomegaly or masses felt. Normal bowel sounds heard. Central nervous system: Alert and oriented. No focal neurological deficits. Extremities: Symmetric 5 x 5 power. Skin: No rashes, lesions or ulcers.  Psychiatry: Judgement and insight appear normal. Mood & affect appropriate.    Data Review: I have personally reviewed the following laboratory data and studies,  CBC: Recent Labs  Lab 09/15/20 0441 09/16/20 0350 09/17/20 0651 09/18/20 0420 09/18/20 1341 09/19/20 0258  WBC 3.4* 5.0 6.3 6.9  --  17.8*  NEUTROABS  --   --  5.4 6.0  --  17.1*  HGB 7.5* 8.3* 9.4* 9.1* 9.9* 7.4*  HCT 24.8* 27.5* 31.5* 31.4* 29.0* 26.1*  MCV 79.7* 80.4 81.4 84.0  --  86.4  PLT 87* 122* 171 174  --  265    Basic Metabolic Panel: Recent Labs  Lab 09/14/20 0144 09/15/20 0441 09/16/20 0350 09/17/20 0651 09/18/20 0420 09/18/20 1341 09/19/20 0258  NA 131* 131* 132* 136 136 139 136  K 4.0 3.2* 3.3* 3.2* 4.1 4.0 4.6  CL 101 99 99 101 102  --  99  CO2 19* 22 25 29 29   --  26   GLUCOSE 307* 381* 355* 184* 124*  --  206*  BUN 47* 51* 47* 40* 36*  --  36*  CREATININE 1.60* 1.44* 1.29* 1.12* 1.09*  --  1.28*  CALCIUM 7.8* 8.0* 7.8* 7.8* 7.5*  --  7.6*  MG 2.0  --   --   --   --   --   --   PHOS 4.4  --   --   --   --   --   --     Liver Function Tests: Recent Labs  Lab 09/17/20 0651 09/18/20 0420 09/19/20 0258  AST 26 45* 54*  ALT 19 33 39  ALKPHOS 48 47 34*  BILITOT 0.9 0.9 1.0  PROT 7.5 6.6 5.8*  ALBUMIN 2.8* 2.6* 2.5*    No results for input(s): LIPASE, AMYLASE in the last 168 hours. No  results for input(s): AMMONIA in the last 168 hours. Cardiac Enzymes: No results for input(s): CKTOTAL, CKMB, CKMBINDEX, TROPONINI in the last 168 hours. BNP (last 3 results) Recent Labs    03/08/20 1309 06/12/20 0954 09/12/20 1830  BNP 824.4* 774.5* 1,139.8*     ProBNP (last 3 results) No results for input(s): PROBNP in the last 8760 hours.  CBG: Recent Labs  Lab 09/18/20 2012 09/19/20 0006 09/19/20 0359 09/19/20 0727 09/19/20 1218  GLUCAP 188* 177* 181* 167* 162*    Recent Results (from the past 240 hour(s))  Resp Panel by RT-PCR (Flu A&B, Covid) Nasopharyngeal Swab     Status: None   Collection Time: 09/12/20  6:30 PM   Specimen: Nasopharyngeal Swab; Nasopharyngeal(NP) swabs in vial transport medium  Result Value Ref Range Status   SARS Coronavirus 2 by RT PCR NEGATIVE NEGATIVE Final    Comment: (NOTE) SARS-CoV-2 target nucleic acids are NOT DETECTED.  The SARS-CoV-2 RNA is generally detectable in upper respiratory specimens during the acute phase of infection. The lowest concentration of SARS-CoV-2 viral copies this assay can detect is 138 copies/mL. A negative result does not preclude SARS-Cov-2 infection and should not be used as the sole basis for treatment or other patient management decisions. A negative result may occur with  improper specimen collection/handling, submission of specimen other than nasopharyngeal swab, presence of  viral mutation(s) within the areas targeted by this assay, and inadequate number of viral copies(<138 copies/mL). A negative result must be combined with clinical observations, patient history, and epidemiological information. The expected result is Negative.  Fact Sheet for Patients:  BloggerCourse.com  Fact Sheet for Healthcare Providers:  SeriousBroker.it  This test is no t yet approved or cleared by the [place] FDA and  has been authorized for detection and/or diagnosis of SARS-CoV-2 by FDA under an Emergency Use Authorization (EUA). This EUA will remain  in effect (meaning this test can be used) for the duration of the COVID-19 declaration under Section 564(b)(1) of the Act, 21 U.S.C.section 360bbb-3(b)(1), unless the authorization is terminated  or revoked sooner.       Influenza A by PCR NEGATIVE NEGATIVE Final   Influenza B by PCR NEGATIVE NEGATIVE Final    Comment: (NOTE) The Xpert Xpress SARS-CoV-2/FLU/RSV plus assay is intended as an aid in the diagnosis of influenza from Nasopharyngeal swab specimens and should not be used as a sole basis for treatment. Nasal washings and aspirates are unacceptable for Xpert Xpress SARS-CoV-2/FLU/RSV testing.  Fact Sheet for Patients: BloggerCourse.com  Fact Sheet for Healthcare Providers: SeriousBroker.it  This test is not yet approved or cleared by the [place] FDA and has been authorized for detection and/or diagnosis of SARS-CoV-2 by FDA under an Emergency Use Authorization (EUA). This EUA will remain in effect (meaning this test can be used) for the duration of the COVID-19 declaration under Section 564(b)(1) of the Act, 21 U.S.C. section 360bbb-3(b)(1), unless the authorization is terminated or revoked.  Performed at [facility], [address], [place], [place] [address]       Studies: No results  found.   Hughie Closs, MD  [facility] 09/19/2020  If 7PM-7AM, please contact night-coverage

## 2020-09-19 NOTE — NC FL2 (Addendum)
[place] MEDICAID [id number] LEVEL OF CARE SCREENING TOOL     IDENTIFICATION  Patient Name: [name] Birthdate: 04/06/1965 Sex: female Admission Date (Current Location): 09/12/2020  [facility] and [place] Number:  Producer, television/film/video and Address:  [facility], [address] [place], [place] [address]      Provider Number: [id number]  Attending Physician Name and Address:  Hughie Closs, MD  Relative Name and Phone Number:  Dedra Skeens, [phone]    Current Level of Care: Hospital Recommended Level of Care: Skilled Nursing Facility Prior Approval Number:    Date Approved/Denied:   PASRR Number:   [id number] Discharge Plan: SNF    Current Diagnoses: Patient Active Problem List   Diagnosis Date Noted   Pancytopenia (HCC) 09/12/2020   Chronic respiratory failure with hypoxia (HCC)    Chronic ITP (idiopathic thrombocytopenia) (HCC) 03/08/2020   Acute on chronic respiratory failure with hypoxia (HCC) 03/08/2020   Cellulitis of foot 08/02/2019   Nausea and vomiting 06/26/2019   Hematuria 06/26/2019   Hypertensive urgency 06/26/2019   Acute on chronic diastolic CHF (congestive heart failure) (HCC)    CHF (congestive heart failure) (HCC) 04/11/2019   Acute exacerbation of CHF (congestive heart failure) (HCC) 04/11/2019   Leukopenia 04/11/2019   History of ITP 04/11/2019   Idiopathic thrombocytopenic purpura (ITP) (HCC) 11/16/2018   Onychomycosis of toenail 10/16/2018   Cataract of both eyes 10/16/2018   Tinea pedis of left foot 10/16/2018   Decreased vision in both eyes 10/16/2018   CKD (chronic kidney disease) stage 3, GFR 30-59 ml/min (HCC)    Cellulitis of left lower extremity 01/25/2018   Hyperkalemia 01/25/2018   Poor compliance 03/08/2017   Hypoxia 03/07/2017   Thyroid nodule 03/07/2017   Chronic indwelling Foley catheter 03/04/2017   Type II diabetes mellitus (HCC)    Femur fracture (HCC) 03/02/2017   Orthostatic dizziness     Urinary tract infection without hematuria    Enterococcus faecalis infection    Near syncope 11/25/2015   Dehydration 11/25/2015   Orthostatic hypotension 11/25/2015   UTI (urinary tract infection) 11/25/2015   Acute worsening of stage 3 chronic kidney disease (HCC) 09/24/2015   Diabetes mellitus due to underlying condition with chronic kidney disease, without long-term current use of insulin (HCC)    Fever    Status post below knee amputation of right lower extremity (HCC)    Diabetic peripheral neuropathy (HCC)    Neuropathic pain    Benign essential HTN    Folliculitis    Slow transit constipation    Anemia of chronic disease    Bilateral hydronephrosis    Urinary retention    CKD (chronic kidney disease)    Uncontrolled type 2 diabetes mellitus with diabetic nephropathy, without long-term current use of insulin (HCC)    Vasculopathy    Debility 09/04/2015   DM type 2 with diabetic peripheral neuropathy (HCC)    S/P BKA (below knee amputation) unilateral (HCC)    Medical non-compliance    Benign skin lesion of multiple sites    Tachypnea    Acute blood loss anemia    Neurogenic bladder    Occult blood positive stool    Rectovaginal fistula    Controlled diabetes mellitus type 2 with complications (HCC)    AKI (acute kidney injury) (HCC)    Hydronephrosis determined by ultrasound    Papular rash    Uncontrolled type 2 diabetes mellitus with complication (HCC)    Paresthesia  Thrombocytopenia (HCC) 08/23/2015   Pressure ulcer 08/23/2015   Severe anemia 08/23/2015   Lower urinary tract infectious disease 08/23/2015   Symptomatic anemia 08/23/2015   Peripheral vascular disease, unspecified (HCC) 04/27/2012   Unilateral complete BKA (HCC) 07/09/2011   Gas gangrene (HCC) 07/02/2011   Sepsis(995.91) 07/02/2011   Acute kidney injury superimposed on chronic kidney disease (HCC) 07/02/2011   Diabetes mellitus (HCC) 11/22/2010   Diabetic foot ulcer associated with type 2  diabetes mellitus (HCC)    Essential hypertension    Arthritis    Neuropathy (HCC)    Intracerebral hemorrhage (HCC)     Orientation RESPIRATION BLADDER Height & Weight     Self, Time, Situation, Place  Normal Incontinent, External catheter Weight: 233 lb 0.4 oz (105.7 kg) Height:  5\' 7"  (170.2 cm)  BEHAVIORAL SYMPTOMS/MOOD NEUROLOGICAL BOWEL NUTRITION STATUS      Continent Diet (See DC summary)  AMBULATORY STATUS COMMUNICATION OF NEEDS Skin   Total Care Verbally Surgical wounds (R leg amputation, Abdomen surgical incision)                       Personal Care Assistance Level of Assistance  Bathing, Feeding, Dressing Bathing Assistance: Maximum assistance Feeding assistance: Independent Dressing Assistance: Maximum assistance     Functional Limitations Info  Sight, Hearing, Speech Sight Info: Adequate Hearing Info: Adequate Speech Info: Adequate    SPECIAL CARE FACTORS FREQUENCY  PT (By licensed PT), OT (By licensed OT)     PT Frequency: 5x week OT Frequency: 5x week            Contractures Contractures Info: Not present    Additional Factors Info  Code Status, Allergies, Insulin Sliding Scale Code Status Info: Full Allergies Info: NKA   Insulin Sliding Scale Info: Insulin Aspart (Novolog) 0-15U every 4 hours       Current Medications (09/19/2020):  This is the current hospital active medication list Current Facility-Administered Medications  Medication Dose Route Frequency Provider Last Rate Last Admin   0.9 %  sodium chloride infusion  250 mL Intravenous PRN Opyd, 09/21/2020, MD       acetaminophen (TYLENOL) tablet 1,000 mg  1,000 mg Oral Q8H Meuth, Brooke A, PA-C   1,000 mg at 09/19/20 0509   acetaminophen (TYLENOL) tablet 650 mg  650 mg Oral Q4H PRN Opyd, 09/21/20, MD   650 mg at 09/16/20 2047   amLODipine (NORVASC) tablet 10 mg  10 mg Oral Daily 2048, MD   10 mg at 09/19/20 0934   Chlorhexidine Gluconate Cloth 2 % PADS 6 each  6 each  Topical Q0600 09/21/20, MD   6 each at 09/19/20 1227   folic acid (FOLVITE) tablet 2 mg  2 mg Oral Daily 09/21/20, MD   2 mg at 09/19/20 0933   furosemide (LASIX) tablet 40 mg  40 mg Oral BID Pokhrel, Laxman, MD   40 mg at 09/19/20 0933   gabapentin (NEURONTIN) capsule 400 mg  400 mg Oral BID Pokhrel, Laxman, MD   400 mg at 09/19/20 0934   HYDROmorphone (DILAUDID) injection 0.5-1 mg  0.5-1 mg Intravenous Q3H PRN Meuth, Brooke A, PA-C   0.5 mg at 09/19/20 0931   insulin aspart (novoLOG) injection 0-15 Units  0-15 Units Subcutaneous Q4H 03-05-1984, MD   3 Units at 09/19/20 1227   isosorbide mononitrate (IMDUR) 24 hr tablet 30 mg  30 mg Oral Daily Pokhrel, Laxman, MD   30 mg at  09/19/20 0933   loperamide (IMODIUM) capsule 2 mg  2 mg Oral PRN Pokhrel, Laxman, MD   2 mg at 09/17/20 2241   metoprolol succinate (TOPROL-XL) 24 hr tablet 50 mg  50 mg Oral Daily Pokhrel, Laxman, MD   50 mg at 09/19/20 0934   ondansetron (ZOFRAN) injection 4 mg  4 mg Intravenous Q6H PRN Opyd, Lavone Neri, MD       oxyCODONE (Oxy IR/ROXICODONE) immediate release tablet 5-10 mg  5-10 mg Oral Q4H PRN Meuth, Brooke A, PA-C   10 mg at 09/18/20 2139   pantoprazole (PROTONIX) EC tablet 40 mg  40 mg Oral Daily Pokhrel, Laxman, MD   40 mg at 09/19/20 0934   polyvinyl alcohol (LIQUIFILM TEARS) 1.4 % ophthalmic solution 1 drop  1 drop Both Eyes PRN Pokhrel, Laxman, MD       rosuvastatin (CRESTOR) tablet 20 mg  20 mg Oral Daily Pokhrel, Laxman, MD   20 mg at 09/19/20 5397     Discharge Medications: Please see discharge summary for a list of discharge medications.  Relevant Imaging Results:  Relevant Lab Results:   Additional Information ssn: [id number]  Carley Hammed, LCSWA

## 2020-09-19 NOTE — Progress Notes (Signed)
[facility] Progress Note  1 Day Post-Op  Subjective: CC-  Abdomen sore from surgery yesterday but pain well controlled on current regimen. Tolerating clear liquids. Denies n/v.  Objective: Vital signs in last 24 hours: Temp:  [97.3 F (36.3 C)-98.6 F (37 C)] 97.9 F (36.6 C) (09/16 0905) Pulse Rate:  [55-70] 70 (09/16 0905) Resp:  [11-18] 18 (09/16 0905) BP: (96-144)/(51-75) 129/75 (09/16 0905) SpO2:  [91 %-100 %] 93 % (09/16 0905) Arterial Line BP: (90-129)/(33-47) 129/46 (09/15 1725) Weight:  [105.7 kg] 105.7 kg (09/16 0013) Last BM Date: 09/17/20  Intake/Output from previous day: 09/15 0701 - 09/16 0700 In: 2100 [I.V.:2000; IV Piggyback:100] Out: 2530 [Urine:2250; Drains:130; Blood:150] Intake/Output this shift: No intake/output data recorded.  PE: Gen:  Alert, NAD, pleasant Pulm: rate and effort normal Abd: Soft, ND, appropriately tender over incision, +BS, incisions C/D/I without erythema or drainage, drain with serosanguinous fluid in bulb Ext:  R BKA  Lab Results:  Recent Labs    09/18/20 0420 09/18/20 1341 09/19/20 0258  WBC 6.9  --  17.8*  HGB 9.1* 9.9* 7.4*  HCT 31.4* 29.0* 26.1*  PLT 174  --  265   BMET Recent Labs    09/18/20 0420 09/18/20 1341 09/19/20 0258  NA 136 139 136  K 4.1 4.0 4.6  CL 102  --  99  CO2 29  --  26  GLUCOSE 124*  --  206*  BUN 36*  --  36*  CREATININE 1.09*  --  1.28*  CALCIUM 7.5*  --  7.6*   PT/INR No results for input(s): LABPROT, INR in the last 72 hours. CMP     Component Value Date/Time   NA 136 09/19/2020 0258   NA 139 05/16/2019 1522   NA 138 09/30/2015 1118   K 4.6 09/19/2020 0258   K 4.6 09/30/2015 1118   CL 99 09/19/2020 0258   CO2 26 09/19/2020 0258   CO2 19 (L) 09/30/2015 1118   GLUCOSE 206 (H) 09/19/2020 0258   GLUCOSE 200 (H) 09/30/2015 1118   BUN 36 (H) 09/19/2020 0258   BUN 23 05/16/2019 1522   BUN 22.3 09/30/2015 1118   CREATININE 1.28 (H) 09/19/2020 0258   CREATININE  2.63 (H) 06/12/2020 0755   CREATININE 1.3 (H) 09/30/2015 1118   CALCIUM 7.6 (L) 09/19/2020 0258   CALCIUM 9.6 09/30/2015 1118   PROT 5.8 (L) 09/19/2020 0258   PROT 8.2 05/16/2019 1522   ALBUMIN 2.5 (L) 09/19/2020 0258   ALBUMIN 4.2 05/16/2019 1522   AST 54 (H) 09/19/2020 0258   AST 15 06/12/2020 0755   ALT 39 09/19/2020 0258   ALT 9 06/12/2020 0755   ALKPHOS 34 (L) 09/19/2020 0258   BILITOT 1.0 09/19/2020 0258   BILITOT 0.3 06/12/2020 0755   GFRNONAA 50 (L) 09/19/2020 0258   GFRNONAA 21 (L) 06/12/2020 0755   GFRAA 59 (L) 09/19/2019 1449   Lipase     Component Value Date/Time   LIPASE 23 06/26/2019 0124       Studies/Results: No results found.  Anti-infectives: Anti-infectives (From admission, onward)    Start     Dose/Rate Route Frequency Ordered Stop   09/18/20 1115  cefoTEtan (CEFOTAN) 2 g in sodium chloride 0.9 % 100 mL IVPB        2 g 200 mL/hr over 30 Minutes Intravenous  Once 09/18/20 1112 09/18/20 1250   09/18/20 1114  sodium chloride 0.9 % with cefoTEtan (CEFOTAN) ADS Med  Note to Pharmacy: Janene Harvey   : cabinet override      09/18/20 1114 09/18/20 1253        Assessment/Plan POD#1 ITP/Thrombocytopenia s/p Hand-assisted laparoscopic splenectomy 6/15 Dr. Donell Beers - Doing well for POD#1. Pain controlled on current regimen. Will advance to FLD and as tolerated to carb mod diet. Mobilize, PT to see. Continue JP drain and monitor output. Labs in AM.   FEN - CLD ADAT to CM VTE - SCD ID - cefotetan periop. WBC up today 17.8 likely from surgery yesterday, afebrile. Repeat CBC in AM Foley - d/c foley today when mobilizing   WC bound due to RLE BKA DM CHF HTN H/O CVA   LOS: 7 days    Franne Forts, [facility] 09/19/2020, 9:58 AM Please see Amion for pager number during day hours 7:00am-4:30pm

## 2020-09-20 DIAGNOSIS — D649 Anemia, unspecified: Secondary | ICD-10-CM | POA: Diagnosis not present

## 2020-09-20 LAB — CBC WITH DIFFERENTIAL/PLATELET
Abs Immature Granulocytes: 0 10*3/uL (ref 0.00–0.07)
Basophils Absolute: 0 10*3/uL (ref 0.0–0.1)
Basophils Relative: 0 %
Eosinophils Absolute: 0.2 10*3/uL (ref 0.0–0.5)
Eosinophils Relative: 1 %
HCT: 28.2 % — ABNORMAL LOW (ref 36.0–46.0)
Hemoglobin: 8.1 g/dL — ABNORMAL LOW (ref 12.0–15.0)
Lymphocytes Relative: 4 %
Lymphs Abs: 0.9 10*3/uL (ref 0.7–4.0)
MCH: 25 pg — ABNORMAL LOW (ref 26.0–34.0)
MCHC: 28.7 g/dL — ABNORMAL LOW (ref 30.0–36.0)
MCV: 87 fL (ref 80.0–100.0)
Monocytes Absolute: 0.9 10*3/uL (ref 0.1–1.0)
Monocytes Relative: 4 %
Neutro Abs: 19.4 10*3/uL — ABNORMAL HIGH (ref 1.7–7.7)
Neutrophils Relative %: 91 %
Platelets: 302 10*3/uL (ref 150–400)
RBC: 3.24 MIL/uL — ABNORMAL LOW (ref 3.87–5.11)
RDW: 24.2 % — ABNORMAL HIGH (ref 11.5–15.5)
WBC: 21.3 10*3/uL — ABNORMAL HIGH (ref 4.0–10.5)
nRBC: 0.5 % — ABNORMAL HIGH (ref 0.0–0.2)
nRBC: 3 /100 WBC — ABNORMAL HIGH

## 2020-09-20 LAB — GLUCOSE, CAPILLARY
Glucose-Capillary: 107 mg/dL — ABNORMAL HIGH (ref 70–99)
Glucose-Capillary: 115 mg/dL — ABNORMAL HIGH (ref 70–99)
Glucose-Capillary: 161 mg/dL — ABNORMAL HIGH (ref 70–99)
Glucose-Capillary: 180 mg/dL — ABNORMAL HIGH (ref 70–99)
Glucose-Capillary: 76 mg/dL (ref 70–99)
Glucose-Capillary: 94 mg/dL (ref 70–99)
Glucose-Capillary: 94 mg/dL (ref 70–99)

## 2020-09-20 LAB — COMPREHENSIVE METABOLIC PANEL
ALT: 30 U/L (ref 0–44)
AST: 38 U/L (ref 15–41)
Albumin: 2.6 g/dL — ABNORMAL LOW (ref 3.5–5.0)
Alkaline Phosphatase: 43 U/L (ref 38–126)
Anion gap: 10 (ref 5–15)
BUN: 38 mg/dL — ABNORMAL HIGH (ref 6–20)
CO2: 26 mmol/L (ref 22–32)
Calcium: 7.3 mg/dL — ABNORMAL LOW (ref 8.9–10.3)
Chloride: 98 mmol/L (ref 98–111)
Creatinine, Ser: 1.57 mg/dL — ABNORMAL HIGH (ref 0.44–1.00)
GFR, Estimated: 39 mL/min — ABNORMAL LOW (ref >60)
Glucose, Bld: 77 mg/dL (ref 70–99)
Potassium: 4.5 mmol/L (ref 3.5–5.1)
Sodium: 134 mmol/L — ABNORMAL LOW (ref 135–145)
Total Bilirubin: 0.6 mg/dL (ref 0.3–1.2)
Total Protein: 6.3 g/dL — ABNORMAL LOW (ref 6.5–8.1)

## 2020-09-20 MED ORDER — FUROSEMIDE 40 MG PO TABS
40.0000 mg | ORAL_TABLET | Freq: Every day | ORAL | Status: DC
  Administered 2020-09-21: 40 mg via ORAL
  Filled 2020-09-20: qty 1

## 2020-09-20 MED ORDER — METOPROLOL SUCCINATE ER 25 MG PO TB24
25.0000 mg | ORAL_TABLET | Freq: Every day | ORAL | Status: DC
  Administered 2020-09-21 – 2020-10-01 (×11): 25 mg via ORAL
  Filled 2020-09-20 (×11): qty 1

## 2020-09-20 NOTE — Progress Notes (Signed)
PROGRESS NOTE  [name] EXH:[id number] DOB: February 21, 1965 DOA: 09/12/2020 PCP: Annita Brod, MD   LOS: 8 days   Brief narrative:  [name] is a 55 y.o. female with medical history significant for ITP, type 2 diabetes mellitus, chronic kidney disease, chronic diastolic CHF, chronic hypoxic respiratory failure, presented to the hospital with shortness of breath, fatigue, and bruising for one month and she is now unable to get up without assistance.  Patient stated that she was out of Lasix for 1 month.  Patient was supposed to be following up with hem-oncology but had been busy and was not able to follow-up with him.  In the ED, patient was hemodynamically stable but hemoglobin was 4.5, platelet less than 5.  WBC 2.8.  Patient had mild hyperkalemia with elevated creatinine of 1.8.  Chest x-ray showed cardiomegaly with small pleural effusion and pulmonary edema.  In the ED, patient was treated with IV Lasix, Protonix, dexamethasone, and Lokelma. 2 units PRBC were ordered and was admitted to the hospital.   Assessment/Plan:  Principal Problem:   Symptomatic anemia Active Problems:   Type II diabetes mellitus (HCC)   CKD (chronic kidney disease) stage 3, GFR 30-59 ml/min (HCC)   Idiopathic thrombocytopenic purpura (ITP) (HCC)   Acute on chronic diastolic CHF (congestive heart failure) (HCC)   Pancytopenia (HCC)  Idiopathic thrombocytopenic purpura; pancytopenia; symptomatic anemia  Presented with platelet of < 5. patient received IV steroids, received Nplate and IVIG.  Oncology on board.  Received 2 units of packed RBC during hospitalization..received Aranesp and Feraheme on 09/15/2020. continue folic acid.  Patient has received pneumococcal meningococcal and haemophilus vaccination.  Platelets improved and 174 today with oncology recommended splenectomy.  General surgery consulted.  Patient underwent a splenectomy on 09/18/2020.  Doing well postoperatively.  Platelets rising as  anticipated.  Advancing diet to soft diet today.  Will discharge when cleared by general surgery.  Hypokalemia: Resolved.  Essential hypertension: On the low normal side.  Discontinue amlodipine and reduce Toprol-XL to 25 mg but continue Imdur.  Acute on chronic diastolic CHF with chronic hypoxic respiratory failure  Patient was out of her Lasix at home.  Continue Lasix 40 mg p.o. twice daily for now.  Some rise in creatinine.  She appears euvolemic.  We will reduce Lasix back to once daily 40 mg p.o.   CKD IIIb - SCr is 1.83 on admission, up from 1.51 in June 2022.  Creatinine with some rise but still within baseline range.   Type II DM with hyperglycemia Hemoglobin A1c was 6.2% in June 2022.  Blood sugar controlled on SSI.  Continue this.  Debility, deconditioning, ambulatory dysfunction. States that she has DME wheelchair at home for ambulation, PT recommends SNF.  Patient declines that and wants to go home.  DVT prophylaxis: SCDs Start: 09/12/20 1951  Code Status: Full code  Family Communication: None  Status is: Inpatient  Remains inpatient appropriate because:IV treatments appropriate due to intensity of illness or inability to take PO and Inpatient level of care appropriate due to severity of illness  Dispo: The patient is from: Home              Anticipated d/c is to: Home               Patient currently is not medically stable to d/c.   Difficult to place patient No  Consultants: Medical oncology General surgery  Procedures: PRBC transfusion III units.  Anti-infectives:  None  Anti-infectives (From admission,  onward)    Start     Dose/Rate Route Frequency Ordered Stop   09/18/20 1115  cefoTEtan (CEFOTAN) 2 g in sodium chloride 0.9 % 100 mL IVPB        2 g 200 mL/hr over 30 Minutes Intravenous  Once 09/18/20 1112 09/18/20 1250   09/18/20 1114  sodium chloride 0.9 % with cefoTEtan (CEFOTAN) ADS Med       Note to Pharmacy: Janene Harvey   : cabinet override       09/18/20 1114 09/18/20 1253      Subjective: Seen and examined.  No complaints.   Objective: Vitals:   09/20/20 0955 09/20/20 1120  BP: (!) 107/47 (!) 99/54  Pulse: 68 65  Resp:    Temp:  100.1 F (37.8 C)  SpO2:  97%    Intake/Output Summary (Last 24 hours) at 09/20/2020 1427 Last data filed at 09/20/2020 0500 Gross per 24 hour  Intake --  Output 1240 ml  Net -1240 ml    Filed Weights   09/17/20 0415 09/18/20 0425 09/19/20 0013  Weight: 107.6 kg 106.8 kg 105.7 kg   Body mass index is 36.5 kg/m.   Physical Exam:  General exam: Appears calm and comfortable  Respiratory system: Clear to auscultation. Respiratory effort normal. Cardiovascular system: S1 & S2 heard, RRR. No JVD, murmurs, rubs, gallops or clicks. No pedal edema. Gastrointestinal system: Abdomen is nondistended, soft and very minimal periumbilical and right lower quadrant tenderness, has JP drain in place.. No organomegaly or masses felt. Normal bowel sounds heard. Central nervous system: Alert and oriented. No focal neurological deficits. Extremities: Symmetric 5 x 5 power. Skin: No rashes, lesions or ulcers.  Psychiatry: Judgement and insight appear normal. Mood & affect appropriate.    Data Review: I have personally reviewed the following laboratory data and studies,  CBC: Recent Labs  Lab 09/16/20 0350 09/17/20 0651 09/18/20 0420 09/18/20 1341 09/19/20 0258 09/20/20 0313  WBC 5.0 6.3 6.9  --  17.8* 21.3*  NEUTROABS  --  5.4 6.0  --  17.1* 19.4*  HGB 8.3* 9.4* 9.1* 9.9* 7.4* 8.1*  HCT 27.5* 31.5* 31.4* 29.0* 26.1* 28.2*  MCV 80.4 81.4 84.0  --  86.4 87.0  PLT 122* 171 174  --  265 302    Basic Metabolic Panel: Recent Labs  Lab 09/14/20 0144 09/15/20 0441 09/16/20 0350 09/17/20 0651 09/18/20 0420 09/18/20 1341 09/19/20 0258 09/20/20 0313  NA 131*   < > 132* 136 136 139 136 134*  K 4.0   < > 3.3* 3.2* 4.1 4.0 4.6 4.5  CL 101   < > 99 101 102  --  99 98  CO2 19*   < > 25 29 29    --  26 26  GLUCOSE 307*   < > 355* 184* 124*  --  206* 77  BUN 47*   < > 47* 40* 36*  --  36* 38*  CREATININE 1.60*   < > 1.29* 1.12* 1.09*  --  1.28* 1.57*  CALCIUM 7.8*   < > 7.8* 7.8* 7.5*  --  7.6* 7.3*  MG 2.0  --   --   --   --   --   --   --   PHOS 4.4  --   --   --   --   --   --   --    < > = values in this interval not displayed.    Liver Function Tests: Recent Labs  Lab 09/17/20 0651 09/18/20 0420 09/19/20 0258 09/20/20 0313  AST 26 45* 54* 38  ALT 19 33 39 30  ALKPHOS 48 47 34* 43  BILITOT 0.9 0.9 1.0 0.6  PROT 7.5 6.6 5.8* 6.3*  ALBUMIN 2.8* 2.6* 2.5* 2.6*    No results for input(s): LIPASE, AMYLASE in the last 168 hours. No results for input(s): AMMONIA in the last 168 hours. Cardiac Enzymes: No results for input(s): CKTOTAL, CKMB, CKMBINDEX, TROPONINI in the last 168 hours. BNP (last 3 results) Recent Labs    03/08/20 1309 06/12/20 0954 09/12/20 1830  BNP 824.4* 774.5* 1,139.8*     ProBNP (last 3 results) No results for input(s): PROBNP in the last 8760 hours.  CBG: Recent Labs  Lab 09/19/20 1549 09/20/20 0036 09/20/20 0439 09/20/20 0722 09/20/20 1118  GLUCAP 174* 94 76 94 161*    Recent Results (from the past 240 hour(s))  Resp Panel by RT-PCR (Flu A&B, Covid) Nasopharyngeal Swab     Status: None   Collection Time: 09/12/20  6:30 PM   Specimen: Nasopharyngeal Swab; Nasopharyngeal(NP) swabs in vial transport medium  Result Value Ref Range Status   SARS Coronavirus 2 by RT PCR NEGATIVE NEGATIVE Final    Comment: (NOTE) SARS-CoV-2 target nucleic acids are NOT DETECTED.  The SARS-CoV-2 RNA is generally detectable in upper respiratory specimens during the acute phase of infection. The lowest concentration of SARS-CoV-2 viral copies this assay can detect is 138 copies/mL. A negative result does not preclude SARS-Cov-2 infection and should not be used as the sole basis for treatment or other patient management decisions. A negative result  may occur with  improper specimen collection/handling, submission of specimen other than nasopharyngeal swab, presence of viral mutation(s) within the areas targeted by this assay, and inadequate number of viral copies(<138 copies/mL). A negative result must be combined with clinical observations, patient history, and epidemiological information. The expected result is Negative.  Fact Sheet for Patients:  BloggerCourse.com  Fact Sheet for Healthcare Providers:  SeriousBroker.it  This test is no t yet approved or cleared by the [place] FDA and  has been authorized for detection and/or diagnosis of SARS-CoV-2 by FDA under an Emergency Use Authorization (EUA). This EUA will remain  in effect (meaning this test can be used) for the duration of the COVID-19 declaration under Section 564(b)(1) of the Act, 21 U.S.C.section 360bbb-3(b)(1), unless the authorization is terminated  or revoked sooner.       Influenza A by PCR NEGATIVE NEGATIVE Final   Influenza B by PCR NEGATIVE NEGATIVE Final    Comment: (NOTE) The Xpert Xpress SARS-CoV-2/FLU/RSV plus assay is intended as an aid in the diagnosis of influenza from Nasopharyngeal swab specimens and should not be used as a sole basis for treatment. Nasal washings and aspirates are unacceptable for Xpert Xpress SARS-CoV-2/FLU/RSV testing.  Fact Sheet for Patients: BloggerCourse.com  Fact Sheet for Healthcare Providers: SeriousBroker.it  This test is not yet approved or cleared by the [place] FDA and has been authorized for detection and/or diagnosis of SARS-CoV-2 by FDA under an Emergency Use Authorization (EUA). This EUA will remain in effect (meaning this test can be used) for the duration of the COVID-19 declaration under Section 564(b)(1) of the Act, 21 U.S.C. section 360bbb-3(b)(1), unless the authorization is terminated  or revoked.  Performed at [facility], [address], [place], [place] [address]       Studies: No results found.   Hughie Closs, MD  [facility] 09/20/2020  If 7PM-7AM, please contact night-coverage

## 2020-09-20 NOTE — Progress Notes (Signed)
[facility] Progress Note  2 Days Post-Op  Subjective: CC-  No acute events overnight, no complaints this morning.  She feels hungry.  Objective: Vital signs in last 24 hours: Temp:  [97.5 F (36.4 C)-98.7 F (37.1 C)] 98.7 F (37.1 C) (09/17 0811) Pulse Rate:  [58-70] 66 (09/17 0811) Resp:  [11-18] 15 (09/17 0811) BP: (107-131)/(53-75) 122/60 (09/17 0811) SpO2:  [93 %-99 %] 99 % (09/17 0811) Last BM Date: 09/17/20  Intake/Output from previous day: 09/16 0701 - 09/17 0700 In: 960 [P.O.:960] Out: 1240 [Urine:1100; Drains:80] Intake/Output this shift: No intake/output data recorded.  PE: Gen:  Alert, NAD, pleasant Pulm: rate and effort normal Abd: Soft, ND, appropriately tender over incision, +BS, incisions C/D/I without erythema or drainage, drain with serosanguinous fluid in bulb Ext:  R BKA  Lab Results:  Recent Labs    09/19/20 0258 09/20/20 0313  WBC 17.8* 21.3*  HGB 7.4* 8.1*  HCT 26.1* 28.2*  PLT 265 302    BMET Recent Labs    09/19/20 0258 09/20/20 0313  NA 136 134*  K 4.6 4.5  CL 99 98  CO2 26 26  GLUCOSE 206* 77  BUN 36* 38*  CREATININE 1.28* 1.57*  CALCIUM 7.6* 7.3*    PT/INR No results for input(s): LABPROT, INR in the last 72 hours. CMP     Component Value Date/Time   NA 134 (L) 09/20/2020 0313   NA 139 05/16/2019 1522   NA 138 09/30/2015 1118   K 4.5 09/20/2020 0313   K 4.6 09/30/2015 1118   CL 98 09/20/2020 0313   CO2 26 09/20/2020 0313   CO2 19 (L) 09/30/2015 1118   GLUCOSE 77 09/20/2020 0313   GLUCOSE 200 (H) 09/30/2015 1118   BUN 38 (H) 09/20/2020 0313   BUN 23 05/16/2019 1522   BUN 22.3 09/30/2015 1118   CREATININE 1.57 (H) 09/20/2020 0313   CREATININE 2.63 (H) 06/12/2020 0755   CREATININE 1.3 (H) 09/30/2015 1118   CALCIUM 7.3 (L) 09/20/2020 0313   CALCIUM 9.6 09/30/2015 1118   PROT 6.3 (L) 09/20/2020 0313   PROT 8.2 05/16/2019 1522   ALBUMIN 2.6 (L) 09/20/2020 0313   ALBUMIN 4.2 05/16/2019 1522   AST  38 09/20/2020 0313   AST 15 06/12/2020 0755   ALT 30 09/20/2020 0313   ALT 9 06/12/2020 0755   ALKPHOS 43 09/20/2020 0313   BILITOT 0.6 09/20/2020 0313   BILITOT 0.3 06/12/2020 0755   GFRNONAA 39 (L) 09/20/2020 0313   GFRNONAA 21 (L) 06/12/2020 0755   GFRAA 59 (L) 09/19/2019 1449   Lipase     Component Value Date/Time   LIPASE 23 06/26/2019 0124       Studies/Results: No results found.  Anti-infectives: Anti-infectives (From admission, onward)    Start     Dose/Rate Route Frequency Ordered Stop   09/18/20 1115  cefoTEtan (CEFOTAN) 2 g in sodium chloride 0.9 % 100 mL IVPB        2 g 200 mL/hr over 30 Minutes Intravenous  Once 09/18/20 1112 09/18/20 1250   09/18/20 1114  sodium chloride 0.9 % with cefoTEtan (CEFOTAN) ADS Med       Note to Pharmacy: Janene Harvey   : cabinet override      09/18/20 1114 09/18/20 1253        Assessment/Plan POD#2 ITP/Thrombocytopenia s/p Hand-assisted laparoscopic splenectomy 6/15 Dr. Donell Beers - Doing well for POD#2. Pain controlled on current regimen. Will advance to soft diet and as tolerated to  carb mod diet. Mobilize, PT to see. Continue JP drain and monitor output.  Creatinine up slightly this morning, urine output adequate.  Labs in AM.   FEN - soft ADAT to CM VTE - SCD ID - cefotetan periop. WBC uptrending likely secondary to splenectomy, afebrile. Repeat CBC in AM Foley - d/c foley today when mobilizing   WC bound due to RLE BKA DM CHF HTN H/O CVA   LOS: 8 days    Berna Bue, MD [facility] 09/20/2020, 8:39 AM Please see Amion for pager number during day hours 7:00am-4:30pm

## 2020-09-20 NOTE — Progress Notes (Signed)
[name] got through surgery very nicely.  She had a splenectomy a couple days ago.  I am very appreciative of the incredible skill of the surgeons.  Her platelet count has gone up as expected.  Her blood sugars are much better.  Her platelet count is 302,000.  Her hemoglobin came up a little bit at 8.1.  Her blood sugar is 77.  Her BUN is 38 creatinine 1.57.  I would hope to have her in remission after her splenectomy.  I told her that if her blood sugars are better controlled, her platelets should also be better controlled.  Her vital signs are all stable.  Temperature 97.6.  Pulse 66.  Blood pressure 131/63.  Her abdomen shows a healing laparoscopy scar in the midline.  There is decreased bowel sounds.  There is no fluid wave.  Lungs are clear bilaterally.  Cardiac exam regular rate and rhythm.  Again, I would expect that she should be in remission after her splenectomy.  It will be interesting to see what the pathology on the spleen shows.  Hopefully, should be able to go home soon.  Again, I am so grateful for the incredible help that she has had from everybody up on [address].  Christin Bach, MD  Psalms 73:26

## 2020-09-21 ENCOUNTER — Inpatient Hospital Stay (HOSPITAL_COMMUNITY): Payer: Medicare Other

## 2020-09-21 DIAGNOSIS — D649 Anemia, unspecified: Secondary | ICD-10-CM | POA: Diagnosis not present

## 2020-09-21 LAB — BASIC METABOLIC PANEL
Anion gap: 8 (ref 5–15)
BUN: 43 mg/dL — ABNORMAL HIGH (ref 6–20)
CO2: 25 mmol/L (ref 22–32)
Calcium: 6.5 mg/dL — ABNORMAL LOW (ref 8.9–10.3)
Chloride: 97 mmol/L — ABNORMAL LOW (ref 98–111)
Creatinine, Ser: 2.05 mg/dL — ABNORMAL HIGH (ref 0.44–1.00)
GFR, Estimated: 28 mL/min — ABNORMAL LOW (ref >60)
Glucose, Bld: 84 mg/dL (ref 70–99)
Potassium: 4.4 mmol/L (ref 3.5–5.1)
Sodium: 130 mmol/L — ABNORMAL LOW (ref 135–145)

## 2020-09-21 LAB — CBC WITH DIFFERENTIAL/PLATELET
Abs Immature Granulocytes: 0.3 10*3/uL — ABNORMAL HIGH (ref 0.00–0.07)
Basophils Absolute: 0 10*3/uL (ref 0.0–0.1)
Basophils Relative: 0 %
Eosinophils Absolute: 0.6 10*3/uL — ABNORMAL HIGH (ref 0.0–0.5)
Eosinophils Relative: 2 %
HCT: 25.4 % — ABNORMAL LOW (ref 36.0–46.0)
Hemoglobin: 7.5 g/dL — ABNORMAL LOW (ref 12.0–15.0)
Lymphocytes Relative: 4 %
Lymphs Abs: 1.2 10*3/uL (ref 0.7–4.0)
MCH: 25.4 pg — ABNORMAL LOW (ref 26.0–34.0)
MCHC: 29.5 g/dL — ABNORMAL LOW (ref 30.0–36.0)
MCV: 86.1 fL (ref 80.0–100.0)
Metamyelocytes Relative: 1 %
Monocytes Absolute: 2.4 10*3/uL — ABNORMAL HIGH (ref 0.1–1.0)
Monocytes Relative: 8 %
Neutro Abs: 25.3 10*3/uL — ABNORMAL HIGH (ref 1.7–7.7)
Neutrophils Relative %: 85 %
Platelets: 381 10*3/uL (ref 150–400)
RBC: 2.95 MIL/uL — ABNORMAL LOW (ref 3.87–5.11)
RDW: 24.8 % — ABNORMAL HIGH (ref 11.5–15.5)
WBC: 29.8 10*3/uL — ABNORMAL HIGH (ref 4.0–10.5)
nRBC: 0 /100 WBC
nRBC: 0.2 % (ref 0.0–0.2)

## 2020-09-21 LAB — GLUCOSE, CAPILLARY
Glucose-Capillary: 91 mg/dL (ref 70–99)
Glucose-Capillary: 108 mg/dL — ABNORMAL HIGH (ref 70–99)
Glucose-Capillary: 182 mg/dL — ABNORMAL HIGH (ref 70–99)
Glucose-Capillary: 138 mg/dL — ABNORMAL HIGH (ref 70–99)
Glucose-Capillary: 126 mg/dL — ABNORMAL HIGH (ref 70–99)

## 2020-09-21 LAB — TYPE AND SCREEN
ABO/RH(D): B POS
Antibody Screen: NEGATIVE
Unit division: 0
Unit division: 0
Unit division: 0
Unit division: 0

## 2020-09-21 LAB — BPAM RBC
Blood Product Expiration Date: 202210062359
Blood Product Expiration Date: 202210062359
Blood Product Expiration Date: 202210062359
Blood Product Expiration Date: 202210062359
ISSUE DATE / TIME: 202209151318
ISSUE DATE / TIME: 202209151318
Unit Type and Rh: 7300
Unit Type and Rh: 7300
Unit Type and Rh: 7300
Unit Type and Rh: 7300

## 2020-09-21 LAB — URINALYSIS, MICROSCOPIC (REFLEX)

## 2020-09-21 LAB — URINALYSIS, ROUTINE W REFLEX MICROSCOPIC
Bilirubin Urine: NEGATIVE
Glucose, UA: NEGATIVE mg/dL
Hgb urine dipstick: NEGATIVE
Ketones, ur: NEGATIVE mg/dL
Nitrite: NEGATIVE
Protein, ur: NEGATIVE mg/dL
Specific Gravity, Urine: 1.01 (ref 1.005–1.030)
pH: 5.5 (ref 5.0–8.0)

## 2020-09-21 MED ORDER — PIPERACILLIN-TAZOBACTAM 3.375 G IVPB
3.3750 g | Freq: Three times a day (TID) | INTRAVENOUS | Status: DC
  Administered 2020-09-21 – 2020-09-23 (×6): 3.375 g via INTRAVENOUS
  Filled 2020-09-21 (×2): qty 50

## 2020-09-21 MED ORDER — ENOXAPARIN SODIUM 40 MG/0.4ML IJ SOSY
40.0000 mg | PREFILLED_SYRINGE | INTRAMUSCULAR | Status: DC
  Administered 2020-09-21: 40 mg via SUBCUTANEOUS
  Filled 2020-09-21: qty 0.4

## 2020-09-21 MED ORDER — SODIUM CHLORIDE 0.9 % IV SOLN: INTRAVENOUS | Status: DC

## 2020-09-21 MED ORDER — ACETAMINOPHEN 325 MG PO TABS
650.0000 mg | ORAL_TABLET | ORAL | Status: DC | PRN
  Administered 2020-09-21 – 2020-09-24 (×8): 650 mg via ORAL
  Filled 2020-09-21 (×8): qty 2

## 2020-09-21 NOTE — Progress Notes (Addendum)
[facility] Progress Note  3 Days Post-Op  Subjective: CC-  No acute events, having a little bit more pain this morning.  Objective: Vital signs in last 24 hours: Temp:  [97.8 F (36.6 C)-100.1 F (37.8 C)] 100 F (37.8 C) (09/18 0310) Pulse Rate:  [63-72] 72 (09/18 0310) Resp:  [15-32] 20 (09/18 0310) BP: (75-124)/(43-60) 124/53 (09/18 0310) SpO2:  [91 %-98 %] 96 % (09/18 0310) Weight:  [110.2 kg] 110.2 kg (09/18 0310) Last BM Date: 09/17/20  Intake/Output from previous day: 09/17 0701 - 09/18 0700 In: 240 [P.O.:240] Out: 120 [Urine:100; Drains:20] Intake/Output this shift: No intake/output data recorded.  PE: Gen:  Alert, NAD, pleasant Pulm: rate and effort normal Abd: Soft, mildly distended, diffusely tender along the upper abdomen, +BS, incisions C/D/I without erythema or drainage, drain with serous fluid in bulb Ext:  R BKA  Lab Results:  Recent Labs    09/20/20 0313 09/21/20 0226  WBC 21.3* 29.8*  HGB 8.1* 7.5*  HCT 28.2* 25.4*  PLT 302 381    BMET Recent Labs    09/20/20 0313 09/21/20 0226  NA 134* 130*  K 4.5 4.4  CL 98 97*  CO2 26 25  GLUCOSE 77 84  BUN 38* 43*  CREATININE 1.57* 2.05*  CALCIUM 7.3* 6.5*    PT/INR No results for input(s): LABPROT, INR in the last 72 hours. CMP     Component Value Date/Time   NA 130 (L) 09/21/2020 0226   NA 139 05/16/2019 1522   NA 138 09/30/2015 1118   K 4.4 09/21/2020 0226   K 4.6 09/30/2015 1118   CL 97 (L) 09/21/2020 0226   CO2 25 09/21/2020 0226   CO2 19 (L) 09/30/2015 1118   GLUCOSE 84 09/21/2020 0226   GLUCOSE 200 (H) 09/30/2015 1118   BUN 43 (H) 09/21/2020 0226   BUN 23 05/16/2019 1522   BUN 22.3 09/30/2015 1118   CREATININE 2.05 (H) 09/21/2020 0226   CREATININE 2.63 (H) 06/12/2020 0755   CREATININE 1.3 (H) 09/30/2015 1118   CALCIUM 6.5 (L) 09/21/2020 0226   CALCIUM 9.6 09/30/2015 1118   PROT 6.3 (L) 09/20/2020 0313   PROT 8.2 05/16/2019 1522   ALBUMIN 2.6 (L) 09/20/2020  0313   ALBUMIN 4.2 05/16/2019 1522   AST 38 09/20/2020 0313   AST 15 06/12/2020 0755   ALT 30 09/20/2020 0313   ALT 9 06/12/2020 0755   ALKPHOS 43 09/20/2020 0313   BILITOT 0.6 09/20/2020 0313   BILITOT 0.3 06/12/2020 0755   GFRNONAA 28 (L) 09/21/2020 0226   GFRNONAA 21 (L) 06/12/2020 0755   GFRAA 59 (L) 09/19/2019 1449   Lipase     Component Value Date/Time   LIPASE 23 06/26/2019 0124       Studies/Results: No results found.  Anti-infectives: Anti-infectives (From admission, onward)    Start     Dose/Rate Route Frequency Ordered Stop   09/18/20 1115  cefoTEtan (CEFOTAN) 2 g in sodium chloride 0.9 % 100 mL IVPB        2 g 200 mL/hr over 30 Minutes Intravenous  Once 09/18/20 1112 09/18/20 1250   09/18/20 1114  sodium chloride 0.9 % with cefoTEtan (CEFOTAN) ADS Med       Note to Pharmacy: Janene Harvey   : cabinet override      09/18/20 1114 09/18/20 1253        Assessment/Plan POD#3 ITP/Thrombocytopenia s/p Hand-assisted laparoscopic splenectomy 6/15 Dr. Donell Beers -Advance to carb modified diet -Mobilize as much  as able -Defer AKI to primary   FEN - soft ADAT to CM VTE - SCD ID - cefotetan periop. WBC uptrending likely secondary to splenectomy, afebrile. Repeat CBC in AM Foley - d/c foley today when mobilizing   WC bound due to RLE BKA DM CHF HTN H/O CVA   Addendum: Notified by Dr. Jacqulyn Bath that the patient had had a fever of 102.2 at some point after I saw her.  I reexamined her at about 1130 this morning.  She is alert, abdomen is soft, tenderness is appropriate with no peritoneal signs (actually less tender than when I initially examined her this morning).  An infectious work-up and empiric antibiotics have been initiated, so far her chest x-ray has resulted and she does have a large left effusion.  Could consider thoracentesis.  Continue aggressive pulmonary toilet.  Okay to begin DVT prophylaxis and recommend doing so.   LOS: 9 days    Berna Bue,  MD [facility] 09/21/2020, 9:13 AM Please see Amion for pager number during day hours 7:00am-4:30pm

## 2020-09-21 NOTE — Progress Notes (Signed)
Physical Therapy Treatment Patient Details Name: [name] MRN: [id number] DOB: 1965-03-13 Today's Date: 09/21/2020   History of Present Illness [name] is a 55 y.o. female with medical history significant for ITP, type 2 diabetes mellitus, chronic kidney disease, chronic diastolic CHF, chronic hypoxic respiratory failure, history, presenting 09/12/20 with shortness of breath, fatigue, and bruising. S/p hand-assisted laparoscopic splenectomy 6/15.    PT Comments    Focused session on transfer training, attempting various techniques, including anterior +1 person approach, RW, and sliding board. Pt was ultimately unsuccessful with all options, even with maxA this date. Pt was limited by a decline in her cognitive status and strength as she was feeling unwell this date. She had extreme difficulty comprehending the simple cues provided to sequence use of the sliding board. Discussed in length the benefits of rehab prior to discharging home with pt and her mother, but they continue to desire for pt to discharge home with HHPT instead even though she lives with her teenage son who goes to school during the day. Recommending a CIR Consult to see if pt may be a good candidate. I still am recommending follow-up therapy at a facility prior to discharging home at this time to maximize pt safety and independence with all functional mobility as pt was mod I with transfers PTA and is currently unable to transfer with maxA. Will continue to follow acutely.    Recommendations for follow up therapy are one component of a multi-disciplinary discharge planning process, led by the attending physician.  Recommendations may be updated based on patient status, additional functional criteria and insurance authorization.  Follow Up Recommendations  SNF;Supervision/Assistance - 24 hour;CIR (if not CIR then SNF, if pt declines SNF then HHPT)     Equipment Recommendations  Other (comment) (sliding board)     Recommendations for Other Services Rehab consult     Precautions / Restrictions Precautions Precautions: Fall Precaution Comments: L JP drain, edema in LEs, unable to don prosthesis, hx of R BKA Restrictions Weight Bearing Restrictions: No     Mobility  Bed Mobility Overal bed mobility: Needs Assistance Bed Mobility: Sit to Supine;Supine to Sit     Supine to sit: Min guard;HOB elevated Sit to supine: Min guard;HOB elevated   General bed mobility comments: Extra time and cues for bed mobility, min guard with use of bed controls and rails.    Transfers Overall transfer level: Needs assistance Equipment used: Rolling walker (2 wheeled);1 person hand held assist;Sliding board Transfers: Sit to/from Stand;Lateral/Scoot Transfers Sit to Stand: Max assist;From elevated surface        Lateral/Scoot Transfers: Max assist;With slide board;From elevated surface General transfer comment: Demonstrated and educated pt on use of sliding board, maxA for placement and preparation of board to transfer from elevated EOB to her w/c with the arm rest removed. Pt was unable to comprehend cues to scoot laterally despite re-wording and multiple attempts and maxA, thus unsuccessful with sliding board. Attempted sit to stand from elevated EOB 3x with anterior approach and pt supporting self on therapist with L knee blocked, only got about half-way up to stand with maxA but pt would pull and lean laterally strongly, thus returned to sit. Attempted additional 3x with RW and L knee block intermittently and maxA, unable to clear buttocks. Pt still insisting she will go home instead of go to a SNF to improve her mobility.  Ambulation/Gait             General Gait Details:  Deferred this date   Stairs             Wheelchair Mobility    Modified Rankin (Stroke Patients Only)       Balance Overall balance assessment: Needs assistance Sitting-balance support: Feet supported;No upper  extremity supported;Bilateral upper extremity supported;Single extremity supported Sitting balance-Leahy Scale: Fair Sitting balance - Comments: Min guard for safety sitting statically EOB.   Standing balance support: Bilateral upper extremity supported Standing balance-Leahy Scale: Poor Standing balance comment: reliant on UE and external physcial support, unable to come to full stand this date even with maxA                            Cognition Arousal/Alertness: Awake/alert Behavior During Therapy: WFL for tasks assessed/performed Overall Cognitive Status: Impaired/Different from baseline Area of Impairment: Safety/judgement;Awareness;Following commands;Memory;Attention;Problem solving                   Current Attention Level: Sustained Memory: Decreased short-term memory Following Commands: Follows one step commands inconsistently;Follows one step commands with increased time Safety/Judgement: Decreased awareness of safety;Decreased awareness of deficits Awareness: Emergent Problem Solving: Slow processing;Decreased initiation;Difficulty sequencing;Requires verbal cues;Requires tactile cues General Comments: Pt with very little awareness into extent of her deficits and how they would impact her safety at home. Pt continues to report she will figure out how to do things at home with HHPT even though she was incapable of doing them this date. Pt with poor memory, needing repeated multi-modal simple cues to try to attempt and sequence tasks, but pt unable to follow majority of time.      Exercises      General Comments General comments (skin integrity, edema, etc.): Discussed rehab follow-up options with pt and her mother and benefits of getting therapy prior to going home to maximize pt safety and independence with functional mobility as she lives with her son who goes to school during the day. Pt and her mother continued to desire to discharge home with HHPT instead  of go to SNF. Educated them that pt will need to be compliant with her HEP while in hospital to improve her strength.      Pertinent Vitals/Pain Pain Assessment: Faces Faces Pain Scale: Hurts a little bit Pain Location: generalized grimacing with mobility Pain Descriptors / Indicators: Discomfort;Grimacing Pain Intervention(s): Monitored during session;Limited activity within patient's tolerance;Repositioned    Home Living                      Prior Function            PT Goals (current goals can now be found in the care plan section) Acute Rehab PT Goals Patient Stated Goal: to go home with HHPT PT Goal Formulation: With patient/family Time For Goal Achievement: 09/29/20 Potential to Achieve Goals: Good Progress towards PT goals: Not progressing toward goals - comment (limited by decreased cognitive status and strength)    Frequency    Min 3X/week      PT Plan Frequency needs to be updated;Discharge plan needs to be updated    Co-evaluation              AM-PAC PT "6 Clicks" Mobility   Outcome Measure  Help needed turning from your back to your side while in a flat bed without using bedrails?: A Little Help needed moving from lying on your back to sitting on the side of a flat bed without  using bedrails?: A Little Help needed moving to and from a bed to a chair (including a wheelchair)?: Total Help needed standing up from a chair using your arms (e.g., wheelchair or bedside chair)?: Total Help needed to walk in hospital room?: Total Help needed climbing 3-5 steps with a railing? : Total 6 Click Score: 10    End of Session Equipment Utilized During Treatment: Gait belt Activity Tolerance: Patient limited by fatigue;Patient tolerated treatment well Patient left: in bed;with call bell/phone within reach;with bed alarm set;with SCD's reapplied   PT Visit Diagnosis: Unsteadiness on feet (R26.81);Muscle weakness (generalized) (M62.81);Difficulty in  walking, not elsewhere classified (R26.2)     Time: 5916-3846 PT Time Calculation (min) (ACUTE ONLY): 43 min  Charges:  $Therapeutic Activity: 38-52 mins                     Raymond Gurney, PT, DPT Acute Rehabilitation Services  Pager: [phone] Office: [phone]    Jewel Baize 09/21/2020, 5:14 PM

## 2020-09-21 NOTE — Progress Notes (Signed)
Pharmacy Antibiotic Note  [name] is a 55 y.o. female admitted on 09/12/2020 with sepsis.  Pharmacy has been consulted for Zosyn dosing.  Patient is s/p splenectomy with elevated WBC elevated over the past several days. Recently has worsening fever of 102.2. Pharmacy to start empiric antibiotics while awaiting culture results. Renal function trending down, will continue to follow and adjust as necessary.  Plan: Zosyn 3.375g IV q8h (4 hour infusion). Follow cultures and s/s of infection  Height: 5\' 7"  (170.2 cm) Weight: 110.2 kg (242 lb 15.2 oz) IBW/kg (Calculated) : 61.6  Temp (24hrs), Avg:100 F (37.8 C), Min:97.8 F (36.6 C), Max:102.2 F (39 C)  Recent Labs  Lab 09/17/20 0651 09/18/20 0420 09/19/20 0258 09/20/20 0313 09/21/20 0226  WBC 6.3 6.9 17.8* 21.3* 29.8*  CREATININE 1.12* 1.09* 1.28* 1.57* 2.05*    Estimated Creatinine Clearance: 40.6 mL/min (A) (by C-G formula based on SCr of 2.05 mg/dL (H)).    No Known Allergies  Antimicrobials this admission: Zosyn 9/18 >>   Dose adjustments this admission: none  Microbiology results: 9/18 BCx: pending  Thank you for allowing pharmacy to participate in this patient's care.  10/18, PharmD PGY1 Pharmacy Resident 09/21/2020 11:25 AM Check [email or web address] for unit specific pharmacy number

## 2020-09-21 NOTE — Progress Notes (Signed)
PROGRESS NOTE  [name] HMC:[id number] DOB: Mar 13, 1965 DOA: 09/12/2020 PCP: Annita Brod, MD   LOS: 9 days   Brief narrative:  [name] is a 55 y.o. female with medical history significant for ITP, type 2 diabetes mellitus, chronic kidney disease, chronic diastolic CHF, chronic hypoxic respiratory failure, presented to the hospital with shortness of breath, fatigue, and bruising for one month and she is now unable to get up without assistance.  Patient stated that she was out of Lasix for 1 month.  Patient was supposed to be following up with hem-oncology but had been busy and was not able to follow-up with him.  In the ED, patient was hemodynamically stable but hemoglobin was 4.5, platelet less than 5.  WBC 2.8.  Patient had mild hyperkalemia with elevated creatinine of 1.8.  Chest x-ray showed cardiomegaly with small pleural effusion and pulmonary edema.  In the ED, patient was treated with IV Lasix, Protonix, dexamethasone, and Lokelma. 2 units PRBC were ordered and was admitted to the hospital.   Assessment/Plan:  Principal Problem:   Symptomatic anemia Active Problems:   Type II diabetes mellitus (HCC)   CKD (chronic kidney disease) stage 3, GFR 30-59 ml/min (HCC)   Idiopathic thrombocytopenic purpura (ITP) (HCC)   Acute on chronic diastolic CHF (congestive heart failure) (HCC)   Pancytopenia (HCC)  Idiopathic thrombocytopenic purpura; pancytopenia; symptomatic anemia  Presented with platelet of < 5. patient received IV steroids, received Nplate and IVIG.  Oncology on board.  Received 2 units of packed RBC during hospitalization..received Aranesp and Feraheme on 09/15/2020. continue folic acid.  Patient has received pneumococcal meningococcal and haemophilus vaccination.  Platelets improved and 174 today with oncology recommended splenectomy.  General surgery consulted.  Patient underwent a splenectomy on 09/18/2020.  Doing well postoperatively.  Platelets rising as  anticipated.  General surgery advance diet to regular diet today.  Patient continues to have worsening leukocytosis since she had a splenectomy.  Now she had fever of 102.2 and also her abdomen is more tender compared to yesterday.  Discussed with Dr. Eustaquio Boyden of general surgery personally.  She had mentioned that it is not uncommon for leukocytosis to get worse after splenectomy however fever is worrisome.  She recommended sepsis work-up, UA, chest x-ray and blood culture and initiating antibiotics.  She is aware that patient has slightly more tender abdomen but no plans for repeat CT scan at this point in time.  I will start on Zosyn.  We will avoid vancomycin for now in the setting of rising creatinine.  Hypokalemia: Resolved.  Essential hypertension: On the low normal side yet again.  Will discontinue Imdur.  Continue Toprol-XL.  Acute on chronic diastolic CHF with chronic hypoxic respiratory failure  Patient was out of her Lasix at home.  She appears euvolemic or perhaps a little dehydrated.  Creatinine is rising as well.  We will discontinue Lasix and instead start on gentle hydration since her p.o. intake is also poor.   AKI on CKD IIIb - SCr is 1.83 on admission, up from 1.51 in June 2022.  Creatinine remained stable around 1.2 but has started to rise since yesterday and currently 2.05.  We will discontinue Lasix and avoid any nephrotoxic agents.  Start on gentle hydration of normal saline at 100 cc/h.   Type II DM with hyperglycemia Hemoglobin A1c was 6.2% in June 2022.  Blood sugar controlled on SSI.  Continue this.  Debility, deconditioning, ambulatory dysfunction. States that she has DME wheelchair at home  for ambulation, PT recommends SNF.  Patient declines that and wants to go home.  Acute hyponatremia: Down to 130.  May be hypovolemic hyponatremia.  Start on normal saline.  Repeat tomorrow.  DVT prophylaxis: SCDs Start: 09/12/20 1951  Code Status: Full code  Family  Communication: None  Status is: Inpatient  Remains inpatient appropriate because:IV treatments appropriate due to intensity of illness or inability to take PO and Inpatient level of care appropriate due to severity of illness  Dispo: The patient is from: Home              Anticipated d/c is to: Home               Patient currently is not medically stable to d/c.   Difficult to place patient No  Consultants: Medical oncology General surgery  Procedures: PRBC transfusion III units.  Anti-infectives:  None  Anti-infectives (From admission, onward)    Start     Dose/Rate Route Frequency Ordered Stop   09/18/20 1115  cefoTEtan (CEFOTAN) 2 g in sodium chloride 0.9 % 100 mL IVPB        2 g 200 mL/hr over 30 Minutes Intravenous  Once 09/18/20 1112 09/18/20 1250   09/18/20 1114  sodium chloride 0.9 % with cefoTEtan (CEFOTAN) ADS Med       Note to Pharmacy: Janene Harvey   : cabinet override      09/18/20 1114 09/18/20 1253      Subjective: Patient seen and examined.  She complains of mild abdominal pain.  Looks hemodynamically and clinically stable.   Objective: Vitals:   09/21/20 0922 09/21/20 1000  BP:  (!) 109/54  Pulse:  75  Resp:  20  Temp:  (!) 102.2 F (39 C)  SpO2: 92%     Intake/Output Summary (Last 24 hours) at 09/21/2020 1058 Last data filed at 09/20/2020 1806 Gross per 24 hour  Intake 240 ml  Output 120 ml  Net 120 ml    Filed Weights   09/18/20 0425 09/19/20 0013 09/21/20 0310  Weight: 106.8 kg 105.7 kg 110.2 kg   Body mass index is 38.05 kg/m.   Physical Exam:  General exam: Appears calm and comfortable  Respiratory system: Clear to auscultation. Respiratory effort normal. Cardiovascular system: S1 & S2 heard, RRR. No JVD, murmurs, rubs, gallops or clicks. No pedal edema. Gastrointestinal system: Abdomen is nondistended, soft and moderate generalized tenderness.  JP drain in place.  No organomegaly or masses felt. Normal bowel sounds  heard. Central nervous system: Alert and oriented. No focal neurological deficits. Extremities: Symmetric 5 x 5 power. Skin: No rashes, lesions or ulcers.  Psychiatry: Judgement and insight appear normal. Mood & affect appropriate.   Data Review: I have personally reviewed the following laboratory data and studies,  CBC: Recent Labs  Lab 09/17/20 0651 09/18/20 0420 09/18/20 1341 09/19/20 0258 09/20/20 0313 09/21/20 0226  WBC 6.3 6.9  --  17.8* 21.3* 29.8*  NEUTROABS 5.4 6.0  --  17.1* 19.4* 25.3*  HGB 9.4* 9.1* 9.9* 7.4* 8.1* 7.5*  HCT 31.5* 31.4* 29.0* 26.1* 28.2* 25.4*  MCV 81.4 84.0  --  86.4 87.0 86.1  PLT 171 174  --  265 302 381    Basic Metabolic Panel: Recent Labs  Lab 09/17/20 0651 09/18/20 0420 09/18/20 1341 09/19/20 0258 09/20/20 0313 09/21/20 0226  NA 136 136 139 136 134* 130*  K 3.2* 4.1 4.0 4.6 4.5 4.4  CL 101 102  --  99 98 97*  CO2 29 29  --  26 26 25   GLUCOSE 184* 124*  --  206* 77 84  BUN 40* 36*  --  36* 38* 43*  CREATININE 1.12* 1.09*  --  1.28* 1.57* 2.05*  CALCIUM 7.8* 7.5*  --  7.6* 7.3* 6.5*    Liver Function Tests: Recent Labs  Lab 09/17/20 0651 09/18/20 0420 09/19/20 0258 09/20/20 0313  AST 26 45* 54* 38  ALT 19 33 39 30  ALKPHOS 48 47 34* 43  BILITOT 0.9 0.9 1.0 0.6  PROT 7.5 6.6 5.8* 6.3*  ALBUMIN 2.8* 2.6* 2.5* 2.6*    No results for input(s): LIPASE, AMYLASE in the last 168 hours. No results for input(s): AMMONIA in the last 168 hours. Cardiac Enzymes: No results for input(s): CKTOTAL, CKMB, CKMBINDEX, TROPONINI in the last 168 hours. BNP (last 3 results) Recent Labs    03/08/20 1309 06/12/20 0954 09/12/20 1830  BNP 824.4* 774.5* 1,139.8*     ProBNP (last 3 results) No results for input(s): PROBNP in the last 8760 hours.  CBG: Recent Labs  Lab 09/20/20 1617 09/20/20 1959 09/20/20 2332 09/21/20 0410 09/21/20 0757  GLUCAP 115* 180* 107* 91 108*    Recent Results (from the past 240 hour(s))  Resp Panel  by RT-PCR (Flu A&B, Covid) Nasopharyngeal Swab     Status: None   Collection Time: 09/12/20  6:30 PM   Specimen: Nasopharyngeal Swab; Nasopharyngeal(NP) swabs in vial transport medium  Result Value Ref Range Status   SARS Coronavirus 2 by RT PCR NEGATIVE NEGATIVE Final    Comment: (NOTE) SARS-CoV-2 target nucleic acids are NOT DETECTED.  The SARS-CoV-2 RNA is generally detectable in upper respiratory specimens during the acute phase of infection. The lowest concentration of SARS-CoV-2 viral copies this assay can detect is 138 copies/mL. A negative result does not preclude SARS-Cov-2 infection and should not be used as the sole basis for treatment or other patient management decisions. A negative result may occur with  improper specimen collection/handling, submission of specimen other than nasopharyngeal swab, presence of viral mutation(s) within the areas targeted by this assay, and inadequate number of viral copies(<138 copies/mL). A negative result must be combined with clinical observations, patient history, and epidemiological information. The expected result is Negative.  Fact Sheet for Patients:  11/12/20  Fact Sheet for Healthcare Providers:  BloggerCourse.com  This test is no t yet approved or cleared by the [employer] FDA and  has been authorized for detection and/or diagnosis of SARS-CoV-2 by FDA under an Emergency Use Authorization (EUA). This EUA will remain  in effect (meaning this test can be used) for the duration of the COVID-19 declaration under Section 564(b)(1) of the Act, 21 U.S.C.section 360bbb-3(b)(1), unless the authorization is terminated  or revoked sooner.       Influenza A by PCR NEGATIVE NEGATIVE Final   Influenza B by PCR NEGATIVE NEGATIVE Final    Comment: (NOTE) The Xpert Xpress SARS-CoV-2/FLU/RSV plus assay is intended as an aid in the diagnosis of influenza from Nasopharyngeal swab  specimens and should not be used as a sole basis for treatment. Nasal washings and aspirates are unacceptable for Xpert Xpress SARS-CoV-2/FLU/RSV testing.  Fact Sheet for Patients: [place]  Fact Sheet for Healthcare Providers: BloggerCourse.com  This test is not yet approved or cleared by the [employer] FDA and has been authorized for detection and/or diagnosis of SARS-CoV-2 by FDA under an Emergency Use Authorization (EUA). This EUA will remain in effect (meaning this test can be  used) for the duration of the COVID-19 declaration under Section 564(b)(1) of the Act, 21 U.S.C. section 360bbb-3(b)(1), unless the authorization is terminated or revoked.  Performed at [facility], [address] [place], [place] [address]       Studies: No results found.   Hughie Closs, MD  [facility] 09/21/2020  If 7PM-7AM, please contact night-coverage

## 2020-09-21 NOTE — Plan of Care (Signed)
°  Problem: Coping: °Goal: Level of anxiety will decrease °Outcome: Progressing °  °

## 2020-09-22 ENCOUNTER — Encounter: Payer: Self-pay | Admitting: Physician Assistant

## 2020-09-22 ENCOUNTER — Inpatient Hospital Stay (HOSPITAL_COMMUNITY): Payer: Medicare Other

## 2020-09-22 DIAGNOSIS — D649 Anemia, unspecified: Secondary | ICD-10-CM | POA: Diagnosis not present

## 2020-09-22 HISTORY — PX: IR THORACENTESIS ASP PLEURAL SPACE W/IMG GUIDE: IMG5380

## 2020-09-22 LAB — BODY FLUID CELL COUNT WITH DIFFERENTIAL
Eos, Fluid: 0 %
Lymphs, Fluid: 2 %
Monocyte-Macrophage-Serous Fluid: 8 % — ABNORMAL LOW (ref 50–90)
Neutrophil Count, Fluid: 90 % — ABNORMAL HIGH (ref 0–25)
Total Nucleated Cell Count, Fluid: 13950 cu mm — ABNORMAL HIGH (ref 0–1000)

## 2020-09-22 LAB — COMPREHENSIVE METABOLIC PANEL
ALT: 18 U/L (ref 0–44)
AST: 23 U/L (ref 15–41)
Albumin: 1.9 g/dL — ABNORMAL LOW (ref 3.5–5.0)
Alkaline Phosphatase: 62 U/L (ref 38–126)
Anion gap: 10 (ref 5–15)
BUN: 46 mg/dL — ABNORMAL HIGH (ref 6–20)
CO2: 23 mmol/L (ref 22–32)
Calcium: 6.3 mg/dL — CL (ref 8.9–10.3)
Chloride: 96 mmol/L — ABNORMAL LOW (ref 98–111)
Creatinine, Ser: 2.2 mg/dL — ABNORMAL HIGH (ref 0.44–1.00)
GFR, Estimated: 26 mL/min — ABNORMAL LOW (ref >60)
Glucose, Bld: 93 mg/dL (ref 70–99)
Potassium: 4.1 mmol/L (ref 3.5–5.1)
Sodium: 129 mmol/L — ABNORMAL LOW (ref 135–145)
Total Bilirubin: 0.7 mg/dL (ref 0.3–1.2)
Total Protein: 5.7 g/dL — ABNORMAL LOW (ref 6.5–8.1)

## 2020-09-22 LAB — BLOOD CULTURE ID PANEL (REFLEXED) - BCID2

## 2020-09-22 LAB — LACTATE DEHYDROGENASE, PLEURAL OR PERITONEAL FLUID: LD, Fluid: 244 U/L — ABNORMAL HIGH (ref 3–23)

## 2020-09-22 LAB — URINALYSIS, ROUTINE W REFLEX MICROSCOPIC
Bilirubin Urine: NEGATIVE
Glucose, UA: NEGATIVE mg/dL
Hgb urine dipstick: NEGATIVE
Ketones, ur: NEGATIVE mg/dL
Nitrite: NEGATIVE
Protein, ur: NEGATIVE mg/dL
Specific Gravity, Urine: <1.005 — ABNORMAL LOW (ref 1.005–1.030)
pH: 5.5 (ref 5.0–8.0)

## 2020-09-22 LAB — CBC WITH DIFFERENTIAL/PLATELET
Abs Immature Granulocytes: 0.38 10*3/uL — ABNORMAL HIGH (ref 0.00–0.07)
Basophils Absolute: 0 10*3/uL (ref 0.0–0.1)
Basophils Relative: 0 %
Eosinophils Absolute: 0.1 10*3/uL (ref 0.0–0.5)
Eosinophils Relative: 0 %
HCT: 24.9 % — ABNORMAL LOW (ref 36.0–46.0)
Hemoglobin: 7 g/dL — ABNORMAL LOW (ref 12.0–15.0)
Immature Granulocytes: 2 %
Lymphocytes Relative: 6 %
Lymphs Abs: 1.4 10*3/uL (ref 0.7–4.0)
MCH: 24.5 pg — ABNORMAL LOW (ref 26.0–34.0)
MCHC: 28.1 g/dL — ABNORMAL LOW (ref 30.0–36.0)
MCV: 87.1 fL (ref 80.0–100.0)
Monocytes Absolute: 4.5 10*3/uL — ABNORMAL HIGH (ref 0.1–1.0)
Monocytes Relative: 18 %
Neutro Abs: 18.8 10*3/uL — ABNORMAL HIGH (ref 1.7–7.7)
Neutrophils Relative %: 74 %
Platelets: 607 10*3/uL — ABNORMAL HIGH (ref 150–400)
RBC: 2.86 MIL/uL — ABNORMAL LOW (ref 3.87–5.11)
RDW: 24.1 % — ABNORMAL HIGH (ref 11.5–15.5)
WBC: 25.1 10*3/uL — ABNORMAL HIGH (ref 4.0–10.5)
nRBC: 0.1 % (ref 0.0–0.2)

## 2020-09-22 LAB — URINALYSIS, MICROSCOPIC (REFLEX)

## 2020-09-22 LAB — GLUCOSE, CAPILLARY
Glucose-Capillary: 108 mg/dL — ABNORMAL HIGH (ref 70–99)
Glucose-Capillary: 110 mg/dL — ABNORMAL HIGH (ref 70–99)
Glucose-Capillary: 133 mg/dL — ABNORMAL HIGH (ref 70–99)
Glucose-Capillary: 169 mg/dL — ABNORMAL HIGH (ref 70–99)
Glucose-Capillary: 76 mg/dL (ref 70–99)
Glucose-Capillary: 80 mg/dL (ref 70–99)
Glucose-Capillary: 89 mg/dL (ref 70–99)

## 2020-09-22 LAB — PROTEIN, PLEURAL OR PERITONEAL FLUID: Total protein, fluid: <3 g/dL

## 2020-09-22 LAB — LACTIC ACID, PLASMA
Lactic Acid, Venous: 0.6 mmol/L (ref 0.5–1.9)
Lactic Acid, Venous: 0.9 mmol/L (ref 0.5–1.9)

## 2020-09-22 LAB — PROCALCITONIN: Procalcitonin: 1.62 ng/mL

## 2020-09-22 LAB — ALBUMIN, PLEURAL OR PERITONEAL FLUID: Albumin, Fluid: <1 g/dL

## 2020-09-22 LAB — GRAM STAIN

## 2020-09-22 LAB — AMYLASE: Amylase: 75 U/L (ref 28–100)

## 2020-09-22 LAB — MAGNESIUM: Magnesium: 1.3 mg/dL — ABNORMAL LOW (ref 1.7–2.4)

## 2020-09-22 LAB — LACTATE DEHYDROGENASE: LDH: 204 U/L — ABNORMAL HIGH (ref 98–192)

## 2020-09-22 MED ORDER — LIDOCAINE HCL 1 % IJ SOLN
INTRAMUSCULAR | Status: AC
  Administered 2020-09-22: 10 mL via INTRADERMAL
  Filled 2020-09-22: qty 20

## 2020-09-22 MED ORDER — CALCIUM GLUCONATE-NACL 2-0.675 GM/100ML-% IV SOLN
2.0000 g | Freq: Once | INTRAVENOUS | Status: AC
  Administered 2020-09-22: 2000 mg via INTRAVENOUS
  Filled 2020-09-22: qty 100

## 2020-09-22 MED ORDER — SODIUM CHLORIDE 0.9% IV SOLUTION: Freq: Once | INTRAVENOUS | Status: AC

## 2020-09-22 MED ORDER — SODIUM CHLORIDE 0.9 % IV SOLN
INTRAVENOUS | Status: DC | PRN
  Administered 2020-09-22: 250 mL via INTRAVENOUS

## 2020-09-22 MED ORDER — MAGNESIUM SULFATE 2 GM/50ML IV SOLN
2.0000 g | Freq: Once | INTRAVENOUS | Status: AC
  Administered 2020-09-22: 2 g via INTRAVENOUS
  Filled 2020-09-22: qty 50

## 2020-09-22 MED ORDER — SODIUM CHLORIDE 0.9 % IV SOLN: 4.0000 g | Freq: Once | INTRAVENOUS | Status: DC

## 2020-09-22 MED ORDER — IOHEXOL 9 MG/ML PO SOLN
500.0000 mL | ORAL | Status: AC
Start: 2020-09-22 — End: 2020-09-22

## 2020-09-22 NOTE — Progress Notes (Signed)
PT Cancellation Note  Patient Details Name: [name] MRN: [id number] DOB: 01-Nov-1965   Cancelled Treatment:    Reason Eval/Treat Not Completed: Patient declined, reports, "I need to get get well before I can work with therapy." Increased time educating pt on importance of mobility and MD order for PT treatment. Also asked pt if she is still declining SNF, pt reports, "I'm going home, but I'm not leaving here until I'm well." Again, attempted to explain that once pt is medically clear she will d/c, but she will not stay admitted just for rehab reasons. Pt still declining therapy today; suspect decreased awareness of situation and functional mobility deficits. Will continue to follow acutely.  [name], PT, DPT Acute Rehabilitation Services  Pager [phone] Office [phone]  [name] 09/22/2020, 2:59 PM

## 2020-09-22 NOTE — Progress Notes (Signed)
Date and time results received: 09/22/20 8:55 AM   Test: Calcium Critical Value: 6.3  Name of Provider Notified: Pahwani,Ravi MD

## 2020-09-22 NOTE — Procedures (Signed)
PROCEDURE SUMMARY:  Successful US guided diagnostic and therapeutic thoracentesis. Yielded 500 of amber fluid. Pt tolerated procedure well. No immediate complications.  Specimen was sent for labs. CXR ordered, results pending  EBL negligible  Iyahna Obriant PA-C 09/22/2020 4:29 PM

## 2020-09-22 NOTE — Progress Notes (Signed)
Pt refused to go to CT scan. Pt is currently anxious and stating that is ridiculous that she has to go to so many procedures. Pt stated that she just wants to eat.   Emotional support given to pt.

## 2020-09-22 NOTE — Progress Notes (Addendum)
Pt refused to drink contrast for her CT scan today. Pt stated that she can not drink it. Education and emotional support given.  Pahwani, MD paged to make aware.Maczis PA paged to make aware.

## 2020-09-22 NOTE — Progress Notes (Signed)
   09/22/20 1612  Mobility  Activity Off unit (Patient at procedure)

## 2020-09-22 NOTE — Progress Notes (Signed)
PROGRESS NOTE  [name] ZOX:[id number] DOB: 06-Dec-1965 DOA: 09/12/2020 PCP: Annita Brod, MD   LOS: 10 days   Brief narrative:  [name] is a 55 y.o. female with medical history significant for ITP, type 2 diabetes mellitus, chronic kidney disease, chronic diastolic CHF, chronic hypoxic respiratory failure, presented to the hospital with shortness of breath, fatigue, and bruising for one month and she is now unable to get up without assistance.  Patient stated that she was out of Lasix for 1 month.  Patient was supposed to be following up with hem-oncology but had been busy and was not able to follow-up with him.  In the ED, patient was hemodynamically stable but hemoglobin was 4.5, platelet less than 5.  WBC 2.8.  Patient had mild hyperkalemia with elevated creatinine of 1.8.  Chest x-ray showed cardiomegaly with small pleural effusion and pulmonary edema.  In the ED, patient was treated with IV Lasix, Protonix, dexamethasone, and Lokelma. 2 units PRBC were ordered and was admitted to the hospital.   Assessment/Plan:  Principal Problem:   Symptomatic anemia Active Problems:   Type II diabetes mellitus (HCC)   CKD (chronic kidney disease) stage 3, GFR 30-59 ml/min (HCC)   Idiopathic thrombocytopenic purpura (ITP) (HCC)   Acute on chronic diastolic CHF (congestive heart failure) (HCC)   Pancytopenia (HCC)  Idiopathic thrombocytopenic purpura; pancytopenia; symptomatic anemia  Presented with platelet of < 5. patient received IV steroids, received Nplate and IVIG.  Oncology on board.  Received 2 units of packed RBC during hospitalization..received Aranesp and Feraheme on 09/15/2020. continue folic acid.  Patient has received pneumococcal meningococcal and haemophilus vaccination.  oncology recommended splenectomy.  General surgery consulted.  Patient underwent a splenectomy on 09/18/2020.  Platelets rising postoperatively.  Patient tolerating regular diet.  However she is having  fever so general surgery is repeating CT abdomen and pelvis.  Continues to have drains.   Sepsis secondary to hospital-acquired pneumonia/large left pleural effusion, not POA: Patient continues to have worsening leukocytosis since splenectomy and started having fever on 09/21/2020 with a T-max of 102.2, last temperature 101.6 at 3 AM today.  Also has some tachypnea.  Chest x-ray 09/21/2020 shows some concern of bilateral pneumonia with large left pleural effusion and atelectasis.  Despite of this, patient is requiring only 2 to 3 L of oxygen and denies any shortness of breath however she has nonproductive cough.  I think she probably has pneumonia.  We have already obtained blood culture yesterday which are negative and she has been started on Zosyn.  Doubt MRSA so we will hold off to vancomycin.  I have consulted IR for left-sided thoracentesis and labs are ordered as well.  I have discontinued today's dose of Lovenox in anticipation of thoracentesis.  We will resume Lovenox after thoracentesis.  Acute on chronic anemia: Likely due to acute illness.  Hemoglobin dropped to 7.0.  Will transfuse 1 unit of PRBC, irradiated and CMV negative due to recent splenectomy.  Hypokalemia: Resolved.  Essential hypertension: Blood pressure now fairly within normal range.  Continue Toprol-XL.  Continue to hold rest of the antihypertensives which included Imdur, amlodipine.  Acute on chronic diastolic CHF with chronic hypoxic respiratory failure  Patient was out of her Lasix at home.  She appears euvolemic.  Holding Lasix due to rising creatinine.   AKI on CKD IIIb/obstructive uropathy - SCr is 1.83 on admission, up from 1.51 in June 2022.  Creatinine remained stable around 1.2 but has started to rise since  09/21/2020 and currently 2.2.  Lasix was discontinued 09/21/2020.  Patient also having some obstructive uropathy.  In-N-Out was done yesterday.  Foley catheter ordered by general surgery today.  Hopefully this will  help improve renal function.  Hypocalcemia: 6.3.  Will replace calcium gluconate.   Type II DM with hyperglycemia Hemoglobin A1c was 6.2% in June 2022.  Blood sugar controlled on SSI.  Continue this.  Debility, deconditioning, ambulatory dysfunction. States that she has DME wheelchair at home for ambulation, PT recommends SNF.  Patient declines that and wants to go home.  Acute hyponatremia: Down to 129.  Wondering if this is secondary to hemodilution.  Patient has large left pleural effusion.  We will stop IV fluids.  DVT prophylaxis: SCDs Start: 09/12/20 1951  Code Status: Full code  Family Communication: None  Status is: Inpatient  Remains inpatient appropriate because:IV treatments appropriate due to intensity of illness or inability to take PO and Inpatient level of care appropriate due to severity of illness  Dispo: The patient is from: Home              Anticipated d/c is to: Home               Patient currently is not medically stable to d/c.   Difficult to place patient No  Consultants: Medical oncology General surgery  Procedures: PRBC transfusion III units.  Anti-infectives:  None  Anti-infectives (From admission, onward)    Start     Dose/Rate Route Frequency Ordered Stop   09/21/20 1400  piperacillin-tazobactam (ZOSYN) IVPB 3.375 g        3.375 g 12.5 mL/hr over 240 Minutes Intravenous Every 8 hours 09/21/20 1122     09/18/20 1115  cefoTEtan (CEFOTAN) 2 g in sodium chloride 0.9 % 100 mL IVPB        2 g 200 mL/hr over 30 Minutes Intravenous  Once 09/18/20 1112 09/18/20 1250   09/18/20 1114  sodium chloride 0.9 % with cefoTEtan (CEFOTAN) ADS Med       Note to Pharmacy: Janene Harvey   : cabinet override      09/18/20 1114 09/18/20 1253      Subjective: Patient seen and examined.  Looks stable.  Denies any shortness of breath.  Has some cough.  Abdominal pain is improved as well.  Objective: Vitals:   09/22/20 0313 09/22/20 0910  BP: (!) 126/52 (!)  128/55  Pulse:  76  Resp: 20 20  Temp: (!) 101.6 F (38.7 C) 100.2 F (37.9 C)  SpO2: 91%     Intake/Output Summary (Last 24 hours) at 09/22/2020 1026 Last data filed at 09/22/2020 0605 Gross per 24 hour  Intake 1873.63 ml  Output 725 ml  Net 1148.63 ml    Filed Weights   09/19/20 0013 09/21/20 0310 09/22/20 0313  Weight: 105.7 kg 110.2 kg 111.2 kg   Body mass index is 38.4 kg/m.   Physical Exam:  General exam: Appears calm and comfortable  Respiratory system: Rhonchi in the right base, diminished breath sounds at the left base and left middle lobe.Marland Kitchen Respiratory effort normal. Cardiovascular system: S1 & S2 heard, RRR. No JVD, murmurs, rubs, gallops or clicks. No pedal edema. Gastrointestinal system: Abdomen is nondistended, soft and nontender. No organomegaly or masses felt. Normal bowel sounds heard. Central nervous system: Alert and oriented. No focal neurological deficits. Extremities: Right BKA. Skin: No rashes, lesions or ulcers.  Psychiatry: Judgement and insight appear normal. Mood & affect appropriate.   Data Review:  I have personally reviewed the following laboratory data and studies,  CBC: Recent Labs  Lab 09/18/20 0420 09/18/20 1341 09/19/20 0258 09/20/20 0313 09/21/20 0226 09/22/20 0752  WBC 6.9  --  17.8* 21.3* 29.8* 25.1*  NEUTROABS 6.0  --  17.1* 19.4* 25.3* 18.8*  HGB 9.1* 9.9* 7.4* 8.1* 7.5* 7.0*  HCT 31.4* 29.0* 26.1* 28.2* 25.4* 24.9*  MCV 84.0  --  86.4 87.0 86.1 87.1  PLT 174  --  265 302 381 607*    Basic Metabolic Panel: Recent Labs  Lab 09/18/20 0420 09/18/20 1341 09/19/20 0258 09/20/20 0313 09/21/20 0226 09/22/20 0752  NA 136 139 136 134* 130* 129*  K 4.1 4.0 4.6 4.5 4.4 4.1  CL 102  --  99 98 97* 96*  CO2 29  --  26 26 25 23   GLUCOSE 124*  --  206* 77 84 93  BUN 36*  --  36* 38* 43* 46*  CREATININE 1.09*  --  1.28* 1.57* 2.05* 2.20*  CALCIUM 7.5*  --  7.6* 7.3* 6.5* 6.3*  MG  --   --   --   --   --  1.3*    Liver  Function Tests: Recent Labs  Lab 09/17/20 0651 09/18/20 0420 09/19/20 0258 09/20/20 0313 09/22/20 0752  AST 26 45* 54* 38 23  ALT 19 33 39 30 18  ALKPHOS 48 47 34* 43 62  BILITOT 0.9 0.9 1.0 0.6 0.7  PROT 7.5 6.6 5.8* 6.3* 5.7*  ALBUMIN 2.8* 2.6* 2.5* 2.6* 1.9*    No results for input(s): LIPASE, AMYLASE in the last 168 hours. No results for input(s): AMMONIA in the last 168 hours. Cardiac Enzymes: No results for input(s): CKTOTAL, CKMB, CKMBINDEX, TROPONINI in the last 168 hours. BNP (last 3 results) Recent Labs    03/08/20 1309 06/12/20 0954 09/12/20 1830  BNP 824.4* 774.5* 1,139.8*     ProBNP (last 3 results) No results for input(s): PROBNP in the last 8760 hours.  CBG: Recent Labs  Lab 09/21/20 1605 09/21/20 2025 09/22/20 0009 09/22/20 0513 09/22/20 0827  GLUCAP 138* 126* 80 76 89    Recent Results (from the past 240 hour(s))  Resp Panel by RT-PCR (Flu A&B, Covid) Nasopharyngeal Swab     Status: None   Collection Time: 09/12/20  6:30 PM   Specimen: Nasopharyngeal Swab; Nasopharyngeal(NP) swabs in vial transport medium  Result Value Ref Range Status   SARS Coronavirus 2 by RT PCR NEGATIVE NEGATIVE Final    Comment: (NOTE) SARS-CoV-2 target nucleic acids are NOT DETECTED.  The SARS-CoV-2 RNA is generally detectable in upper respiratory specimens during the acute phase of infection. The lowest concentration of SARS-CoV-2 viral copies this assay can detect is 138 copies/mL. A negative result does not preclude SARS-Cov-2 infection and should not be used as the sole basis for treatment or other patient management decisions. A negative result may occur with  improper specimen collection/handling, submission of specimen other than nasopharyngeal swab, presence of viral mutation(s) within the areas targeted by this assay, and inadequate number of viral copies(<138 copies/mL). A negative result must be combined with clinical observations, patient history, and  epidemiological information. The expected result is Negative.  Fact Sheet for Patients:  11/12/20  Fact Sheet for Healthcare Providers:  BloggerCourse.com  This test is no t yet approved or cleared by the [place] FDA and  has been authorized for detection and/or diagnosis of SARS-CoV-2 by FDA under an Emergency Use Authorization (EUA). This EUA will remain  in effect (meaning this test can be used) for the duration of the COVID-19 declaration under Section 564(b)(1) of the Act, 21 U.S.C.section 360bbb-3(b)(1), unless the authorization is terminated  or revoked sooner.       Influenza A by PCR NEGATIVE NEGATIVE Final   Influenza B by PCR NEGATIVE NEGATIVE Final    Comment: (NOTE) The Xpert Xpress SARS-CoV-2/FLU/RSV plus assay is intended as an aid in the diagnosis of influenza from Nasopharyngeal swab specimens and should not be used as a sole basis for treatment. Nasal washings and aspirates are unacceptable for Xpert Xpress SARS-CoV-2/FLU/RSV testing.  Fact Sheet for Patients: BloggerCourse.com  Fact Sheet for Healthcare Providers: SeriousBroker.it  This test is not yet approved or cleared by the [place] FDA and has been authorized for detection and/or diagnosis of SARS-CoV-2 by FDA under an Emergency Use Authorization (EUA). This EUA will remain in effect (meaning this test can be used) for the duration of the COVID-19 declaration under Section 564(b)(1) of the Act, 21 U.S.C. section 360bbb-3(b)(1), unless the authorization is terminated or revoked.  Performed at [facility], [address], [place], [place] [address]   Culture, blood (routine x 2)     Status: None (Preliminary result)   Collection Time: 09/21/20 10:57 AM   Specimen: BLOOD  Result Value Ref Range Status   Specimen Description BLOOD SITE NOT SPECIFIED  Final   Special Requests    Final    BOTTLES DRAWN AEROBIC AND ANAEROBIC Blood Culture adequate volume   Culture   Final    NO GROWTH < 24 HOURS Performed at [facility], [address] [place], [place] [address]    Report Status PENDING  Incomplete  Culture, blood (routine x 2)     Status: None (Preliminary result)   Collection Time: 09/21/20 11:20 AM   Specimen: BLOOD RIGHT HAND  Result Value Ref Range Status   Specimen Description BLOOD RIGHT HAND  Final   Special Requests   Final    BOTTLES DRAWN AEROBIC ONLY Blood Culture adequate volume   Culture   Final    NO GROWTH < 24 HOURS Performed at [facility], [address], [place], [place] [address]    Report Status PENDING  Incomplete      Studies: DG CHEST PORT 1 VIEW  Result Date: 09/21/2020 CLINICAL DATA:  Chronic respiratory failure. Shortness of breath and fatigue. EXAM: PORTABLE CHEST 1 VIEW COMPARISON:  September 12, 2020 FINDINGS: Stable cardiomegaly. The hila and mediastinum are unchanged. No pneumothorax. There is opacity in the right base. Significant opacity seen on the left. Associated left effusion suspected. Mild increased interstitial markings in the right lung. IMPRESSION: 1. Significant opacification of the left chest. I suspect a moderate to large left pleural effusion with underlying atelectasis. Infiltrate not excluded. The findings are new since September 12, 2020. 2. Opacity in the right base could represent developing infiltrate or atelectasis. Recommend clinical correlation. 3. Probable mild pulmonary venous congestion. Electronically Signed   By: Gerome Sam III M.D.   On: 09/21/2020 13:46     Total time spent in minutes: 37 Hughie Closs, MD  [facility] 09/22/2020  If 7PM-7AM, please contact night-coverage

## 2020-09-22 NOTE — TOC Progression Note (Signed)
Transition of Care [facility]) - Progression Note    Patient Details  Name: [name] MRN: [id number] Date of Birth: 1965/02/17  Transition of Care [facility]) CM/SW Contact  Leone Haven, RN Phone Number: 09/22/2020, 12:11 PM  Clinical Narrative:    Patient would like [facility] services per CSW note, NCM made referral to [facility] with [facility]  for HHPT, HHOT.  She is able to take referral.  Soc will begin 24 to 48 hrs post dc.         Expected Discharge Plan and Services                                                 Social Determinants of Health (SDOH) Interventions Food Insecurity Interventions: Intervention Not Indicated Financial Strain Interventions: Intervention Not Indicated Housing Interventions: Intervention Not Indicated Transportation Interventions: [facility]  Readmission Risk Interventions Readmission Risk Prevention Plan 03/11/2020 04/13/2019 11/13/2018  Transportation Screening Complete Complete Complete  PCP or Specialist Appt within 5-7 Days - - Complete  PCP or Specialist Appt within 3-5 Days Complete Complete -  Home Care Screening - - Complete  Medication Review (RN CM) - - Complete  HRI or Home Care Consult Complete Complete -  Social Work Consult for Recovery Care Planning/Counseling Complete Complete -  Palliative Care Screening Not Applicable Not Applicable -  Medication Review Oceanographer) Complete Complete -  Some recent data might be hidden

## 2020-09-22 NOTE — Plan of Care (Signed)
°  Problem: Coping: °Goal: Level of anxiety will decrease °Outcome: Progressing °  °

## 2020-09-22 NOTE — Progress Notes (Addendum)
4 Days Post-Op  Subjective: CC: Febrile yesterday into this am. Tmax 102.2. Last temp 101.6. Complains of cough that is non-productive. On o2, 3.5L. She denies current cp or sob. She reports stable upper abdominal pain. She reports she ate all of her meal last night without n/v. She has not eaten breakfast. Passing flatus. No BM. RN reports foley came out yesterday and had to get I/O after. Bladder scan > this am. PT note reviewed from yesterday. Appears they are recommending CIR vs SNF.   Objective: Vital signs in last 24 hours: Temp:  [100 F (37.8 C)-102.2 F (39 C)] 101.6 F (38.7 C) (09/19 0313) Pulse Rate:  [72-79] 76 (09/19 0312) Resp:  [17-24] 20 (09/19 0313) BP: (104-126)/(46-85) 126/52 (09/19 0313) SpO2:  [89 %-93 %] 91 % (09/19 0313) Weight:  [111.2 kg] 111.2 kg (09/19 0313) Last BM Date: 09/17/20  Intake/Output from previous day: 09/18 0701 - 09/19 0700 In: 1873.6 [P.O.:855; I.V.:918.5; IV Piggyback:100.1] Out: 725 [Urine:725] Intake/Output this shift: No intake/output data recorded.  PE: Gen:  Alert, NAD, pleasant Card:  Reg Pulm:  Decreased breath sounds on the L. CTA on the R. On o2. Normal rate and effort  Abd: Soft, mildly distended, diffusely tender along the upper abdomen, +BS, incisions C/D/I without erythema or drainage, drain with dark brown/almost black output as noted below Ext:  R BKA. L SCD in place.  Psych: A&Ox3  Skin: no rashes noted, warm and dry    Lab Results:  Recent Labs    09/20/20 0313 09/21/20 0226  WBC 21.3* 29.8*  HGB 8.1* 7.5*  HCT 28.2* 25.4*  PLT 302 381   BMET Recent Labs    09/20/20 0313 09/21/20 0226  NA 134* 130*  K 4.5 4.4  CL 98 97*  CO2 26 25  GLUCOSE 77 84  BUN 38* 43*  CREATININE 1.57* 2.05*  CALCIUM 7.3* 6.5*   PT/INR No results for input(s): LABPROT, INR in the last 72 hours. CMP     Component Value Date/Time   NA 130 (L) 09/21/2020 0226   NA 139 05/16/2019 1522   NA 138 09/30/2015 1118    K 4.4 09/21/2020 0226   K 4.6 09/30/2015 1118   CL 97 (L) 09/21/2020 0226   CO2 25 09/21/2020 0226   CO2 19 (L) 09/30/2015 1118   GLUCOSE 84 09/21/2020 0226   GLUCOSE 200 (H) 09/30/2015 1118   BUN 43 (H) 09/21/2020 0226   BUN 23 05/16/2019 1522   BUN 22.3 09/30/2015 1118   CREATININE 2.05 (H) 09/21/2020 0226   CREATININE 2.63 (H) 06/12/2020 0755   CREATININE 1.3 (H) 09/30/2015 1118   CALCIUM 6.5 (L) 09/21/2020 0226   CALCIUM 9.6 09/30/2015 1118   PROT 6.3 (L) 09/20/2020 0313   PROT 8.2 05/16/2019 1522   ALBUMIN 2.6 (L) 09/20/2020 0313   ALBUMIN 4.2 05/16/2019 1522   AST 38 09/20/2020 0313   AST 15 06/12/2020 0755   ALT 30 09/20/2020 0313   ALT 9 06/12/2020 0755   ALKPHOS 43 09/20/2020 0313   BILITOT 0.6 09/20/2020 0313   BILITOT 0.3 06/12/2020 0755   GFRNONAA 28 (L) 09/21/2020 0226   GFRNONAA 21 (L) 06/12/2020 0755   GFRAA 59 (L) 09/19/2019 1449   Lipase     Component Value Date/Time   LIPASE 23 06/26/2019 0124    Studies/Results: DG CHEST PORT 1 VIEW  Result Date: 09/21/2020 CLINICAL DATA:  Chronic respiratory failure. Shortness of breath and fatigue. EXAM: PORTABLE CHEST  1 VIEW COMPARISON:  September 12, 2020 FINDINGS: Stable cardiomegaly. The hila and mediastinum are unchanged. No pneumothorax. There is opacity in the right base. Significant opacity seen on the left. Associated left effusion suspected. Mild increased interstitial markings in the right lung. IMPRESSION: 1. Significant opacification of the left chest. I suspect a moderate to large left pleural effusion with underlying atelectasis. Infiltrate not excluded. The findings are new since September 12, 2020. 2. Opacity in the right base could represent developing infiltrate or atelectasis. Recommend clinical correlation. 3. Probable mild pulmonary venous congestion. Electronically Signed   By: Gerome Sam III M.D.   On: 09/21/2020 13:46    Anti-infectives: Anti-infectives (From admission, onward)    Start      Dose/Rate Route Frequency Ordered Stop   09/21/20 1400  piperacillin-tazobactam (ZOSYN) IVPB 3.375 g        3.375 g 12.5 mL/hr over 240 Minutes Intravenous Every 8 hours 09/21/20 1122     09/18/20 1115  cefoTEtan (CEFOTAN) 2 g in sodium chloride 0.9 % 100 mL IVPB        2 g 200 mL/hr over 30 Minutes Intravenous  Once 09/18/20 1112 09/18/20 1250   09/18/20 1114  sodium chloride 0.9 % with cefoTEtan (CEFOTAN) ADS Med       Note to Pharmacy: Janene Harvey   : cabinet override      09/18/20 1114 09/18/20 1253        Assessment/Plan POD#4 ITP/Thrombocytopenia s/p Hand-assisted laparoscopic splenectomy 6/15 Dr. Donell Beers - Received vaccines pre-op - Cont JP drain. Drain output more dark today. CT A/P today. Can't have IV contrast w/ current GFR but will give PO contrast.  - Fever workup undergoing. CT A/P as above ordered. Appears she has R lung opacity and L pleural effusion. Consider thora but will defer to primary. BC NGTD. UA w/ small leukocytes, rare bacteria and 11-20 wbc. On abx - Place foley for urinary retention/failed TOV - Labs pending.  - Mobilize. PT rec CIR vs SNF   FEN - Currently on CM VTE - SCD, per [facility] ID - cefotetan periop. Zosyn started 9/18 after developed fever. Foley - replace   - Per TR[facility] -  WC bound due to RLE BKA DM CHF HTN H/O CVA AKI - Cr 2.2   LOS: 10 days    Jacinto Halim , [facility] 09/22/2020, 8:50 AM Please see Amion for pager number during day hours 7:00am-4:30pm

## 2020-09-22 NOTE — Progress Notes (Signed)
PHARMACY - PHYSICIAN COMMUNICATION CRITICAL VALUE ALERT - BLOOD CULTURE IDENTIFICATION (BCID)  [name] is an 55 y.o. female who presented to [facility] on 09/12/2020 with a chief complaint of SOA, fatgiue, and bruising x1 month. Concern for ITP and sepsis 2/2 HAP.   Assessment:   Concern for hospital acquired PNA 1 out of 3 bottles + staph epi (mecA +) Culture seems to be a contaminant Thoracentesis today with of fluid removed.   Name of physician (or Provider) Contacted: Dr. Daleen Bo  Current antibiotics: Zosyn 3.375mg  q8hr  Changes to prescribed antibiotics recommended:  Patient is on recommended antibiotics - No changes needed Continue to monitor cultures and patient clinical status for escalation of therapy  Results for orders placed or performed during the hospital encounter of 09/12/20  Blood Culture ID Panel (Reflexed) (Collected: 09/21/2020 11:20 AM)  Result Value Ref Range   Enterococcus faecalis NOT DETECTED NOT DETECTED   Enterococcus Faecium NOT DETECTED NOT DETECTED   Listeria monocytogenes NOT DETECTED NOT DETECTED   Staphylococcus species DETECTED (A) NOT DETECTED   Staphylococcus aureus (BCID) NOT DETECTED NOT DETECTED   Staphylococcus epidermidis DETECTED (A) NOT DETECTED   Staphylococcus lugdunensis NOT DETECTED NOT DETECTED   Streptococcus species NOT DETECTED NOT DETECTED   Streptococcus agalactiae NOT DETECTED NOT DETECTED   Streptococcus pneumoniae NOT DETECTED NOT DETECTED   Streptococcus pyogenes NOT DETECTED NOT DETECTED   A.calcoaceticus-baumannii NOT DETECTED NOT DETECTED   Bacteroides fragilis NOT DETECTED NOT DETECTED   Enterobacterales NOT DETECTED NOT DETECTED   Enterobacter cloacae complex NOT DETECTED NOT DETECTED   Escherichia coli NOT DETECTED NOT DETECTED   Klebsiella aerogenes NOT DETECTED NOT DETECTED   Klebsiella oxytoca NOT DETECTED NOT DETECTED   Klebsiella pneumoniae NOT DETECTED NOT DETECTED   Proteus species NOT DETECTED  NOT DETECTED   Salmonella species NOT DETECTED NOT DETECTED   Serratia marcescens NOT DETECTED NOT DETECTED   Haemophilus influenzae NOT DETECTED NOT DETECTED   Neisseria meningitidis NOT DETECTED NOT DETECTED   Pseudomonas aeruginosa NOT DETECTED NOT DETECTED   Stenotrophomonas maltophilia NOT DETECTED NOT DETECTED   Candida albicans NOT DETECTED NOT DETECTED   Candida auris NOT DETECTED NOT DETECTED   Candida glabrata NOT DETECTED NOT DETECTED   Candida krusei NOT DETECTED NOT DETECTED   Candida parapsilosis NOT DETECTED NOT DETECTED   Candida tropicalis NOT DETECTED NOT DETECTED   Cryptococcus neoformans/gattii NOT DETECTED NOT DETECTED   Methicillin resistance mecA/C DETECTED (A) NOT DETECTED    Tomie China, PharmD Clinical Pharmacist  Please check AMION for all [facility] Pharmacy numbers After 10:00 PM, call Main Pharmacy [phone]

## 2020-09-23 ENCOUNTER — Inpatient Hospital Stay (HOSPITAL_COMMUNITY): Payer: Medicare Other

## 2020-09-23 DIAGNOSIS — D649 Anemia, unspecified: Secondary | ICD-10-CM | POA: Diagnosis not present

## 2020-09-23 LAB — CBC WITH DIFFERENTIAL/PLATELET
Abs Immature Granulocytes: 0 10*3/uL (ref 0.00–0.07)
Abs Immature Granulocytes: 0.18 10*3/uL — ABNORMAL HIGH (ref 0.00–0.07)
Basophils Absolute: 0 10*3/uL (ref 0.0–0.1)
Basophils Absolute: 0.2 10*3/uL — ABNORMAL HIGH (ref 0.0–0.1)
Basophils Relative: 0 %
Basophils Relative: 1 %
Eosinophils Absolute: 0.2 10*3/uL (ref 0.0–0.5)
Eosinophils Absolute: 0.5 10*3/uL (ref 0.0–0.5)
Eosinophils Relative: 1 %
Eosinophils Relative: 3 %
HCT: 27 % — ABNORMAL LOW (ref 36.0–46.0)
HCT: 28.7 % — ABNORMAL LOW (ref 36.0–46.0)
Hemoglobin: 8.2 g/dL — ABNORMAL LOW (ref 12.0–15.0)
Hemoglobin: 8.4 g/dL — ABNORMAL LOW (ref 12.0–15.0)
Immature Granulocytes: 1 %
Lymphocytes Relative: 3 %
Lymphocytes Relative: 3 %
Lymphs Abs: 0.5 10*3/uL — ABNORMAL LOW (ref 0.7–4.0)
Lymphs Abs: 0.6 10*3/uL — ABNORMAL LOW (ref 0.7–4.0)
MCH: 25.5 pg — ABNORMAL LOW (ref 26.0–34.0)
MCH: 26 pg (ref 26.0–34.0)
MCHC: 29.3 g/dL — ABNORMAL LOW (ref 30.0–36.0)
MCHC: 30.4 g/dL (ref 30.0–36.0)
MCV: 85.7 fL (ref 80.0–100.0)
MCV: 87.2 fL (ref 80.0–100.0)
Monocytes Absolute: 1.8 10*3/uL — ABNORMAL HIGH (ref 0.1–1.0)
Monocytes Absolute: 4.4 10*3/uL — ABNORMAL HIGH (ref 0.1–1.0)
Monocytes Relative: 10 %
Monocytes Relative: 21 %
Neutro Abs: 14.9 10*3/uL — ABNORMAL HIGH (ref 1.7–7.7)
Neutro Abs: 16.1 10*3/uL — ABNORMAL HIGH (ref 1.7–7.7)
Neutrophils Relative %: 74 %
Neutrophils Relative %: 83 %
Platelets: 638 10*3/uL — ABNORMAL HIGH (ref 150–400)
Platelets: 723 10*3/uL — ABNORMAL HIGH (ref 150–400)
RBC: 3.15 MIL/uL — ABNORMAL LOW (ref 3.87–5.11)
RBC: 3.29 MIL/uL — ABNORMAL LOW (ref 3.87–5.11)
RDW: 22.5 % — ABNORMAL HIGH (ref 11.5–15.5)
RDW: 23.2 % — ABNORMAL HIGH (ref 11.5–15.5)
WBC: 17.9 10*3/uL — ABNORMAL HIGH (ref 4.0–10.5)
WBC: 21.5 10*3/uL — ABNORMAL HIGH (ref 4.0–10.5)
nRBC: 0 % (ref 0.0–0.2)
nRBC: 0 /100 WBC
nRBC: 0.1 % (ref 0.0–0.2)

## 2020-09-23 LAB — SURGICAL PATHOLOGY

## 2020-09-23 LAB — COMPREHENSIVE METABOLIC PANEL
ALT: 15 U/L (ref 0–44)
ALT: 16 U/L (ref 0–44)
ALT: 16 U/L (ref 0–44)
AST: 23 U/L (ref 15–41)
AST: 28 U/L (ref 15–41)
AST: 33 U/L (ref 15–41)
Albumin: 2 g/dL — ABNORMAL LOW (ref 3.5–5.0)
Albumin: 2 g/dL — ABNORMAL LOW (ref 3.5–5.0)
Albumin: 2 g/dL — ABNORMAL LOW (ref 3.5–5.0)
Alkaline Phosphatase: 67 U/L (ref 38–126)
Alkaline Phosphatase: 69 U/L (ref 38–126)
Alkaline Phosphatase: 75 U/L (ref 38–126)
Anion gap: 11 (ref 5–15)
Anion gap: 11 (ref 5–15)
Anion gap: 9 (ref 5–15)
BUN: 44 mg/dL — ABNORMAL HIGH (ref 6–20)
BUN: 45 mg/dL — ABNORMAL HIGH (ref 6–20)
BUN: 46 mg/dL — ABNORMAL HIGH (ref 6–20)
CO2: 21 mmol/L — ABNORMAL LOW (ref 22–32)
CO2: 22 mmol/L (ref 22–32)
CO2: 24 mmol/L (ref 22–32)
Calcium: 6.8 mg/dL — ABNORMAL LOW (ref 8.9–10.3)
Calcium: 7.1 mg/dL — ABNORMAL LOW (ref 8.9–10.3)
Calcium: 7.2 mg/dL — ABNORMAL LOW (ref 8.9–10.3)
Chloride: 102 mmol/L (ref 98–111)
Chloride: 102 mmol/L (ref 98–111)
Chloride: 98 mmol/L (ref 98–111)
Creatinine, Ser: 1.99 mg/dL — ABNORMAL HIGH (ref 0.44–1.00)
Creatinine, Ser: 2.1 mg/dL — ABNORMAL HIGH (ref 0.44–1.00)
Creatinine, Ser: 2.14 mg/dL — ABNORMAL HIGH (ref 0.44–1.00)
GFR, Estimated: 27 mL/min — ABNORMAL LOW (ref >60)
GFR, Estimated: 28 mL/min — ABNORMAL LOW (ref >60)
GFR, Estimated: 29 mL/min — ABNORMAL LOW (ref >60)
Glucose, Bld: 118 mg/dL — ABNORMAL HIGH (ref 70–99)
Glucose, Bld: 124 mg/dL — ABNORMAL HIGH (ref 70–99)
Glucose, Bld: 168 mg/dL — ABNORMAL HIGH (ref 70–99)
Potassium: 3.7 mmol/L (ref 3.5–5.1)
Potassium: 3.9 mmol/L (ref 3.5–5.1)
Potassium: 4.4 mmol/L (ref 3.5–5.1)
Sodium: 131 mmol/L — ABNORMAL LOW (ref 135–145)
Sodium: 134 mmol/L — ABNORMAL LOW (ref 135–145)
Sodium: 135 mmol/L (ref 135–145)
Total Bilirubin: 0.4 mg/dL (ref 0.3–1.2)
Total Bilirubin: 0.5 mg/dL (ref 0.3–1.2)
Total Bilirubin: 0.6 mg/dL (ref 0.3–1.2)
Total Protein: 5.8 g/dL — ABNORMAL LOW (ref 6.5–8.1)
Total Protein: 5.9 g/dL — ABNORMAL LOW (ref 6.5–8.1)
Total Protein: 6.2 g/dL — ABNORMAL LOW (ref 6.5–8.1)

## 2020-09-23 LAB — BLOOD GAS, ARTERIAL
Acid-base deficit: 5.5 mmol/L — ABNORMAL HIGH (ref 0.0–2.0)
Bicarbonate: 20.2 mmol/L (ref 20.0–28.0)
Drawn by: 34560
FIO2: 100
O2 Saturation: 99.6 %
Patient temperature: 37.5
pCO2 arterial: 46.5 mmHg (ref 32.0–48.0)
pH, Arterial: 7.265 — ABNORMAL LOW (ref 7.350–7.450)
pO2, Arterial: 171 mmHg — ABNORMAL HIGH (ref 83.0–108.0)

## 2020-09-23 LAB — BPAM RBC
Blood Product Expiration Date: 202210032359
ISSUE DATE / TIME: 202209191835
Unit Type and Rh: 7300

## 2020-09-23 LAB — GLUCOSE, CAPILLARY
Glucose-Capillary: 105 mg/dL — ABNORMAL HIGH (ref 70–99)
Glucose-Capillary: 118 mg/dL — ABNORMAL HIGH (ref 70–99)
Glucose-Capillary: 135 mg/dL — ABNORMAL HIGH (ref 70–99)
Glucose-Capillary: 166 mg/dL — ABNORMAL HIGH (ref 70–99)
Glucose-Capillary: 169 mg/dL — ABNORMAL HIGH (ref 70–99)
Glucose-Capillary: 80 mg/dL (ref 70–99)
Glucose-Capillary: 87 mg/dL (ref 70–99)

## 2020-09-23 LAB — PREPARE RBC (CROSSMATCH)

## 2020-09-23 LAB — CBC
HCT: 29 % — ABNORMAL LOW (ref 36.0–46.0)
Hemoglobin: 8.6 g/dL — ABNORMAL LOW (ref 12.0–15.0)
MCH: 25.7 pg — ABNORMAL LOW (ref 26.0–34.0)
MCHC: 29.7 g/dL — ABNORMAL LOW (ref 30.0–36.0)
MCV: 86.6 fL (ref 80.0–100.0)
Platelets: 637 10*3/uL — ABNORMAL HIGH (ref 150–400)
RBC: 3.35 MIL/uL — ABNORMAL LOW (ref 3.87–5.11)
RDW: 23 % — ABNORMAL HIGH (ref 11.5–15.5)
WBC: 19 10*3/uL — ABNORMAL HIGH (ref 4.0–10.5)
nRBC: 0.1 % (ref 0.0–0.2)

## 2020-09-23 LAB — PATHOLOGIST SMEAR REVIEW

## 2020-09-23 LAB — BRAIN NATRIURETIC PEPTIDE: B Natriuretic Peptide: 1080.6 pg/mL — ABNORMAL HIGH (ref 0.0–100.0)

## 2020-09-23 LAB — LACTIC ACID, PLASMA: Lactic Acid, Venous: 1 mmol/L (ref 0.5–1.9)

## 2020-09-23 LAB — TYPE AND SCREEN
ABO/RH(D): B POS
Antibody Screen: NEGATIVE
Unit division: 0

## 2020-09-23 LAB — TROPONIN I (HIGH SENSITIVITY): Troponin I (High Sensitivity): 24 ng/L — ABNORMAL HIGH (ref <18)

## 2020-09-23 MED ORDER — VANCOMYCIN HCL 1750 MG/350ML IV SOLN
1750.0000 mg | INTRAVENOUS | Status: DC
  Administered 2020-09-25: 1750 mg via INTRAVENOUS
  Filled 2020-09-23 (×2): qty 350

## 2020-09-23 MED ORDER — ENOXAPARIN SODIUM 40 MG/0.4ML IJ SOSY
40.0000 mg | PREFILLED_SYRINGE | INTRAMUSCULAR | Status: DC
  Administered 2020-09-23: 40 mg via SUBCUTANEOUS

## 2020-09-23 MED ORDER — METRONIDAZOLE 500 MG PO TABS
500.0000 mg | ORAL_TABLET | Freq: Two times a day (BID) | ORAL | Status: DC
  Administered 2020-09-23 – 2020-09-24 (×2): 500 mg via ORAL
  Filled 2020-09-23 (×2): qty 1

## 2020-09-23 MED ORDER — VANCOMYCIN HCL 2000 MG/400ML IV SOLN
2000.0000 mg | Freq: Once | INTRAVENOUS | Status: AC
  Administered 2020-09-23: 2000 mg via INTRAVENOUS
  Filled 2020-09-23: qty 400

## 2020-09-23 MED ORDER — IOHEXOL 9 MG/ML PO SOLN
ORAL | Status: AC
  Administered 2020-09-23: 500 mL
  Filled 2020-09-23: qty 1000

## 2020-09-23 MED ORDER — FUROSEMIDE 10 MG/ML IJ SOLN
INTRAMUSCULAR | Status: AC
  Filled 2020-09-23: qty 2

## 2020-09-23 MED ORDER — CEFEPIME HCL 2 G IJ SOLR
2.0000 g | Freq: Two times a day (BID) | INTRAMUSCULAR | Status: AC
  Administered 2020-09-23 – 2020-09-29 (×13): 2 g via INTRAVENOUS
  Filled 2020-09-23 (×13): qty 2

## 2020-09-23 MED ORDER — FUROSEMIDE 10 MG/ML IJ SOLN
20.0000 mg | Freq: Once | INTRAMUSCULAR | Status: AC
  Administered 2020-09-23: 20 mg via INTRAVENOUS

## 2020-09-23 NOTE — Progress Notes (Signed)
Physical Therapy Treatment Patient Details Name: [name] MRN: [id number] DOB: 09-Oct-1965 Today's Date: 09/23/2020   History of Present Illness Pt is a 55 y.o. female admitted 09/12/20 with SOB, fatigue, bruising. Workup for symptomatic anemia, idiopathic thrombocytopenic purpura. 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 CXR 9/18 with concern for bilateral PNA, large L pleural effusion. S/p thoracentesis 9/19. PMH includes R BKA (2013), CHF, DM2, HTN, stroke, ICH, arthritis, neuropathy, chronic indwelling catheter.   PT Comments    Pt slowly progressing with mobility. Today's session focused on attempts at transfer training. Pt with very poor awareness of current physical capability and functional mobility status; pt declined slide board training, but unable to achieve partial standing with maxA; pt requires frequent cues for safety and sequencing. Pt remains limited by generalized weakness, decreased activity tolerance, and impaired cognition.  Pt continues to state, "I'll stay here until I'm strong enough to go home." Continued educ on d/c when medically stable, regardless of strength/functional mobility status. Continue to recommend post-acute rehab to maximize functional mobility and independence prior to return home. Pt would benefit from intensive CIR-level therapies pending activity tolerance and continued participation.    Recommendations for follow up therapy are one component of a multi-disciplinary discharge planning process, led by the attending physician.  Recommendations may be updated based on patient status, additional functional criteria and insurance authorization.  Follow Up Recommendations  CIR;SNF;Supervision for mobility/OOB     Equipment Recommendations   (lift equipment)    Recommendations for Other Services       Precautions / Restrictions Precautions Precautions: Fall;Other (comment) Precaution Comments: L abdominal JP drain; R BKA (unable to don prosthesis  due to swelling) Restrictions Weight Bearing Restrictions: No     Mobility  Bed Mobility Overal bed mobility: Needs Assistance Bed Mobility: Supine to Sit;Sit to Supine     Supine to sit: Supervision;HOB elevated Sit to supine: Supervision   General bed mobility comments: Increased time and effort, use of bed rails    Transfers Overall transfer level: Needs assistance Equipment used: 1 person hand held assist Transfers: Sit to/from Stand           General transfer comment: Pt declined slide board this session, wanting to try standing; educ on recommendation for lateral scoot or squat pivot transfer to recliner or w/c; attempted multiple stands from EOB (increasing bed height with each trial), pt ultimately unable to offload buttocks in order to initiate standing despite maxA+1, increased fatigue with each trial. Pt requiring multimodal cues for sequencing and safety, poor insight into inability to pivot to recliner if she is unable to offload buttocks with maxA, pt continues to want to try by herself to stand  Ambulation/Gait                 Stairs             Wheelchair Mobility    Modified Rankin (Stroke Patients Only)       Balance Overall balance assessment: Needs assistance Sitting-balance support: No upper extremity supported Sitting balance-Leahy Scale: Fair Sitting balance - Comments: prolonged static sitting EOB with and without UE support       Standing balance comment: unable to achieve standing despite UE support and maxA this date                            Cognition Arousal/Alertness: Awake/alert Behavior During Therapy: WFL for tasks assessed/performed Overall Cognitive Status: No family/caregiver present to  determine baseline cognitive functioning Area of Impairment: Safety/judgement;Awareness;Following commands;Memory;Attention;Problem solving                   Current Attention Level: Sustained Memory:  Decreased short-term memory Following Commands: Follows one step commands inconsistently;Follows one step commands with increased time Safety/Judgement: Decreased awareness of safety;Decreased awareness of deficits Awareness: Intellectual;Emergent Problem Solving: Slow processing;Decreased initiation;Difficulty sequencing;Requires verbal cues;Requires tactile cues General Comments: Pt with very little awareness into extent of her deficits and how they would impact her safety at home. Pt continues to report she will figure out how to do things at home with HHPT even though she was incapable of doing them this date. Pt with poor memory, needing repeated multi-modal simple cues to try to attempt and sequence tasks, but pt unable to follow majority of time.      Exercises      General Comments General comments (skin integrity, edema, etc.): Increased time discussing current functional mobility status and recommendation for post-acute rehab; pt continues to state she will return home with son's help; educ pt that she will require heavy assist +2 from strong family, and/or lifting equipment with assist for all aspects of OOB activity. Pt continues to state, "I'm not leaving here until I'm stronger" - decreased insight into fact that pt will d/c hospital once medically stable, regardless of strength/functional mobility level; continued education on this      Pertinent Vitals/Pain Pain Assessment: Faces Faces Pain Scale: Hurts a little bit Pain Location: L-side abdomen (throacentesis site) Pain Descriptors / Indicators: Sore Pain Intervention(s): Monitored during session    Home Living                      Prior Function            PT Goals (current goals can now be found in the care plan section) Progress towards PT goals: Not progressing toward goals - comment    Frequency    Min 3X/week      PT Plan Current plan remains appropriate    Co-evaluation               AM-PAC PT "6 Clicks" Mobility   Outcome Measure  Help needed turning from your back to your side while in a flat bed without using bedrails?: A Little Help needed moving from lying on your back to sitting on the side of a flat bed without using bedrails?: A Little Help needed moving to and from a bed to a chair (including a wheelchair)?: Total Help needed standing up from a chair using your arms (e.g., wheelchair or bedside chair)?: Total Help needed to walk in hospital room?: Total Help needed climbing 3-5 steps with a railing? : Total 6 Click Score: 10    End of Session Equipment Utilized During Treatment: Gait belt Activity Tolerance: Patient limited by fatigue Patient left: in bed;with call bell/phone within reach;with nursing/sitter in room;Other (comment) (with transport) Nurse Communication: Mobility status;Need for lift equipment PT Visit Diagnosis: Unsteadiness on feet (R26.81);Muscle weakness (generalized) (M62.81);Difficulty in walking, not elsewhere classified (R26.2)     Time: 1610-9604 PT Time Calculation (min) (ACUTE ONLY): 30 min  Charges:  $Therapeutic Activity: 8-22 mins $Self Care/Home Management: 8-22            [name], PT, DPT Acute Rehabilitation Services  Pager [phone] Office [phone]  [name] 09/23/2020, 3:13 PM

## 2020-09-23 NOTE — Progress Notes (Signed)
Inpatient Rehab Admissions Coordinator Note:   Per therapy recommendations patient was screened for CIR candidacy by Stephania Fragmin, PT. At this time, pt appears to be a potential candidate for CIR. I will place an order for rehab consult for full assessment, per our protocol.  Please contact me any with questions.Estill Dooms, PT, DPT [phone] 09/23/20 3:27 PM

## 2020-09-23 NOTE — Significant Event (Signed)
I was called by the nurse that patient had become acutely lethargic at shift change and was hypoxic.  Was placed on nonrebreather after which patient became more alert but still lethargic.  On exam at bedside patient is following commands and answering questions slowly moving all extremities pupils equal and reacting to light.  Patient is on a nonrebreather at the time of my exam.  Blood pressure is 150/70 pulse around 70/min temperature 99 F respirations around 18 bpm.  Reviewed patient's labs and medications.  Patient got last dose of oxycodone this morning.  No further sedatives or narcotics after that.  We will be following ABG and I have also ordered a stat chest x-ray along with a metabolic panel lactic acid CBC troponin and BNP.  I have ordered 1 dose of Lasix 20 mg IV for now.  Further recommendation based on the labs ordered.  We will closely monitor.  Midge Minium

## 2020-09-23 NOTE — Progress Notes (Signed)
Pharmacy Antibiotic Note  [name] is a 55 y.o. female admitted on 09/12/2020 with ITP.  Now s/p splenectomy on 9/15. Noted that patient has been having fevers up to 102 since 9/18. Now with blood cultures positive for gram positive cocci in 2/2 and MRSE on the [employer]. Pharmacy has been consulted for vancomycin dosing. WBC 21.5. Scr- 1.99 today with CrCl ~ 42 ml/min.   Plan: Vancomycin 2000 mg X 1 today Then 1750 mg every 48 hours Predicted AUC 513 with trough around 10 Change Zosyn to Cefepime/flagyl to minimize AKI risk  Monitor blood cultures, fever curve, renal function   Height: 5\' 7"  (170.2 cm) Weight: 110.7 kg (244 lb 0.8 oz) IBW/kg (Calculated) : 61.6  Temp (24hrs), Avg:100.7 F (38.2 C), Min:99.9 F (37.7 C), Max:102.3 F (39.1 C)  Recent Labs  Lab 09/19/20 0258 09/20/20 0313 09/21/20 0226 09/22/20 0752 09/22/20 0854 09/22/20 1216 09/22/20 2346 09/23/20 0939  WBC 17.8* 21.3* 29.8* 25.1*  --   --  21.5*  --   CREATININE 1.28* 1.57* 2.05* 2.20*  --   --  2.10* 1.99*  LATICACIDVEN  --   --   --   --  0.6 0.9  --   --     Estimated Creatinine Clearance: 41.9 mL/min (A) (by C-G formula based on SCr of 1.99 mg/dL (H)).    No Known Allergies  Antimicrobials this admission: 9/18 Zosyn>> 9/20 9/20 Cefepime> 9/20 Metronidazole> 9/20 Vanc>    Microbiology results: 9/18 BCx: 2/2 gram positive cocci (MRSE) 9/19 pleural fluid: NGTD  Thank you for allowing pharmacy to be a part of this patient's care.  10/19, PharmD, BCPS, BCIDP Infectious Diseases Clinical Pharmacist Phone: [phone] 09/23/2020 10:51 AM

## 2020-09-23 NOTE — Progress Notes (Addendum)
ANTICOAGULATION CONSULT NOTE  Pharmacy Consult for Lovenox Indication: VTE prophylaxis  Labs: Recent Labs    09/21/20 0226 09/22/20 0752 09/22/20 2346 09/23/20 0939  HGB 7.5* 7.0* 8.2*  --   HCT 25.4* 24.9* 27.0*  --   PLT 381 607* 638*  --   CREATININE 2.05* 2.20* 2.10* 1.99*    Assessment: 28 yof presenting with ITP/pancytopenia/symptomatic anemia s/p splenectomy 9/15. Pharmacy consulted to start Lovenox prophylaxis (previously received dose on 9/18 but held ?for thoracentesis). Patient is not on anticoagulation PTA. Hg down to 7 yesterday, now up to 8.2 s/p PRBC transfusion, plt up to 638. SCr stable 1.99, CrCl >30. Noted, weight up ~5kg since admit (MD currently holding diuresis).  Goal of Therapy:  VTE prevention Monitor platelets by anticoagulation protocol: Yes   Plan:  Resume Lovenox 40mg  [place] q24h - consider increase to 0.5 mg/kg [place] q24h dosing when CBC stabilized and no longer needing transfusions Monitor CBC, SCr, s/sx bleeding, weight changes Pharmacy will sign off consult and monitor peripherally   , PharmD, BCPS Clinical Pharmacist 09/23/2020 11:04 AM

## 2020-09-23 NOTE — Significant Event (Addendum)
Rapid Response Event Note  Reason for Call : Lethargy, acute hypoxia  Initial Focused Assessment:  I was notified by Julious Oka RN regarding Ms. Solinger being difficult to arouse and low pulse oximetry reading in the 50s after returning from a recent trip to [place]. Pt was previously alert, oriented while on 3.5L [place]. CBG 169. Pt was placed on 15L NRB mask prior to my arrival. Pulse oximetry was 100% on NRB mask. Upon arrival, Ms. Monsivais was very somulent, arousable to light touch and could answer simple questions and follow commands. ABG obtained by Alecia Lemming RRT. BBS coarse rhonchi and diminished bases. Productive cough. Skin is warm, pink and diaphoretic. Within a few minutes, Ms. Spaeth was more awake and interactive. Dr. Toniann Fail came to bedside. ABG shows metabolic acidosis (7.26/46.5/171/20.2).PCXR, labs and lasix orders received. Pt was weaned to [place] HFNC at 6L while I was at the bedside. Pulse oximetry 96% on 6L.  2000-99.66F, HR 83 SR, 135/49 (74), RR 20 with sats 95% on 6L [place] HFNC.    Interventions:  -No interventions per RRRN  Plan of Care:  -Continue to wean O2 per previous order as tolerated.    MD Notified: Dr. Toniann Fail at bedside.  Call Time: 1918 Arrival Time: 1922 End Time: 2015  Rose Fillers, RN

## 2020-09-23 NOTE — Progress Notes (Signed)
Pt had CT ABD today.upon arrival pt was alert and asking for her dinner. On 3.5L [place].   @1721  This RN gave pt Tylenol for a temperature of 102.1. patient was alert and eating french fries. Nurse educated patient on antibiotic therapy. Pt stated understanding.  Pt's  on 3.5L [place] with oxygen saturation of 95% at this time.  @1840  pt was seen by NT who emptied foley.    During shift change, this RN and night shift RN entered to give a bedside report and this RN noticed pt was asleep. Nurse attempted to wake pt up to meet her night shift nurse and pt did not opened her eyes.  This nurse sternum rub pt and noticed oxygen saturation on 50%with her nasal canula slightly off nose. Nurse place a non-rebreather mask with oxygen saturation up to 100%. Rapid Response was called, vitals as follows and CBG:169. MD paged and arrived at bedside.  Pt had a incontinent bowel episode.  ABG lab orders placed and completed.   Pt became alert to person and place, however still lethargic.   Exit site of JP site draining all day. ABD pad was changed again and saturated with clear-yellow drainage.   Pt was cleaned, currently sitting down in bed with non-rebreather mask.saline lock. RR at bedside and bedside RN as well.        09/23/20 1915  Vitals  Temp 99.6 F (37.6 C)  Temp Source Oral  BP (!) 154/56  MAP (mmHg) 83  BP Location Left Arm  BP Method Automatic  Patient Position (if appropriate) Lying  Pulse Rate 97  Pulse Rate Source Monitor  MEWS COLOR  MEWS Score Color Green  Oxygen Therapy  SpO2 100 %  O2 Device Non-rebreather Mask  O2 Flow Rate (L/min) 15 L/min

## 2020-09-23 NOTE — Progress Notes (Signed)
5 Days Post-Op  Subjective: CC: Patient with some epigastric and luq pain around her incision and drain that she reports is better than yesterday. Had some nausea this am but no emesis. Tolerating breakfast. Passing flatus. Had an episode of diarrhea yesterday. Foley in place. Underwent thoracentesis yesterday w/ amber fluid yielded. Feels her sob has improved since the procedure. Per RN notes, patient declined CT yesterday. We discussed indication for CT scan and patient is now agreeable. I have let her RN know and they will bring back up the contrast for her to drink. Continues to run fevers w/ temp of 101.2 this am.   Objective: Vital signs in last 24 hours: Temp:  [99.9 F (37.7 C)-102.3 F (39.1 C)] 101.2 F (38.4 C) (09/20 0726) Pulse Rate:  [76-87] 87 (09/20 0726) Resp:  [18-23] 18 (09/20 0726) BP: (128-155)/(51-64) 142/56 (09/20 0847) SpO2:  [91 %-100 %] 95 % (09/20 0726) Weight:  [110.7 kg] 110.7 kg (09/20 0605) Last BM Date: 09/22/20  Intake/Output from previous day: 09/19 0701 - 09/20 0700 In: 1120.4 [P.O.:560; I.V.:36.9; Blood:382; IV Piggyback:141.6] Out: 2310 [Urine:2300; Drains:10] Intake/Output this shift: Total I/O In: 240 [P.O.:240] Out: -   PE: Gen:  Alert, NAD, pleasant Card:  Reg Pulm:  Decreased breath sounds on the L. CTA on the R. On o2. Normal rate and effort  Abd: Soft, some upper abdominal distension, diffusely tender along the upper abdomen greatest in the epigastrium, +BS, incisions C/D/I without erythema or drainage, drain with dark brown/almost black output similar to picture yesterday.  Ext:  R BKA. L SCD in place.  Psych: A&Ox3  Skin: no rashes noted, warm and dry  Lab Results:  Recent Labs    09/22/20 0752 09/22/20 2346  WBC 25.1* 21.5*  HGB 7.0* 8.2*  HCT 24.9* 27.0*  PLT 607* 638*   BMET Recent Labs    09/22/20 0752 09/22/20 2346  NA 129* 131*  K 4.1 3.7  CL 96* 98  CO2 23 24  GLUCOSE 93 118*  BUN 46* 46*   CREATININE 2.20* 2.10*  CALCIUM 6.3* 6.8*   PT/INR No results for input(s): LABPROT, INR in the last 72 hours. CMP     Component Value Date/Time   NA 131 (L) 09/22/2020 2346   NA 139 05/16/2019 1522   NA 138 09/30/2015 1118   K 3.7 09/22/2020 2346   K 4.6 09/30/2015 1118   CL 98 09/22/2020 2346   CO2 24 09/22/2020 2346   CO2 19 (L) 09/30/2015 1118   GLUCOSE 118 (H) 09/22/2020 2346   GLUCOSE 200 (H) 09/30/2015 1118   BUN 46 (H) 09/22/2020 2346   BUN 23 05/16/2019 1522   BUN 22.3 09/30/2015 1118   CREATININE 2.10 (H) 09/22/2020 2346   CREATININE 2.63 (H) 06/12/2020 0755   CREATININE 1.3 (H) 09/30/2015 1118   CALCIUM 6.8 (L) 09/22/2020 2346   CALCIUM 9.6 09/30/2015 1118   PROT 5.8 (L) 09/22/2020 2346   PROT 8.2 05/16/2019 1522   ALBUMIN 2.0 (L) 09/22/2020 2346   ALBUMIN 4.2 05/16/2019 1522   AST 23 09/22/2020 2346   AST 15 06/12/2020 0755   ALT 16 09/22/2020 2346   ALT 9 06/12/2020 0755   ALKPHOS 67 09/22/2020 2346   BILITOT 0.4 09/22/2020 2346   BILITOT 0.3 06/12/2020 0755   GFRNONAA 28 (L) 09/22/2020 2346   GFRNONAA 21 (L) 06/12/2020 0755   GFRAA 59 (L) 09/19/2019 1449   Lipase     Component Value Date/Time  LIPASE 23 06/26/2019 0124    Studies/Results: DG Chest 1 View  Result Date: 09/22/2020 CLINICAL DATA:  Status post left thoracentesis. EXAM: CHEST  1 VIEW COMPARISON:  September 21, 2020. FINDINGS: Stable cardiomegaly. Left pleural effusion is significantly smaller status post thoracentesis. No pneumothorax is noted. IMPRESSION: No pneumothorax status post left thoracentesis. Electronically Signed   By: Lupita Raider M.D.   On: 09/22/2020 17:02   DG CHEST PORT 1 VIEW  Result Date: 09/21/2020 CLINICAL DATA:  Chronic respiratory failure. Shortness of breath and fatigue. EXAM: PORTABLE CHEST 1 VIEW COMPARISON:  September 12, 2020 FINDINGS: Stable cardiomegaly. The hila and mediastinum are unchanged. No pneumothorax. There is opacity in the right base.  Significant opacity seen on the left. Associated left effusion suspected. Mild increased interstitial markings in the right lung. IMPRESSION: 1. Significant opacification of the left chest. I suspect a moderate to large left pleural effusion with underlying atelectasis. Infiltrate not excluded. The findings are new since September 12, 2020. 2. Opacity in the right base could represent developing infiltrate or atelectasis. Recommend clinical correlation. 3. Probable mild pulmonary venous congestion. Electronically Signed   By: Gerome Sam III M.D.   On: 09/21/2020 13:46   IR THORACENTESIS ASP PLEURAL SPACE W/IMG GUIDE  Result Date: 09/22/2020 Sheliah Plane, PA     09/22/2020  4:30 PM PROCEDURE SUMMARY: Successful US guided diagnostic and therapeutic thoracentesis. Yielded 500 of amber fluid. Pt tolerated procedure well. No immediate complications. Specimen was sent for labs. CXR ordered, results pending EBL negligible Hayley Boisseau PA-C 09/22/2020 4:29 PM     Anti-infectives: Anti-infectives (From admission, onward)    Start     Dose/Rate Route Frequency Ordered Stop   09/21/20 1400  piperacillin-tazobactam (ZOSYN) IVPB 3.375 g        3.375 g 12.5 mL/hr over 240 Minutes Intravenous Every 8 hours 09/21/20 1122     09/18/20 1115  cefoTEtan (CEFOTAN) 2 g in sodium chloride 0.9 % 100 mL IVPB        2 g 200 mL/hr over 30 Minutes Intravenous  Once 09/18/20 1112 09/18/20 1250   09/18/20 1114  sodium chloride 0.9 % with cefoTEtan (CEFOTAN) ADS Med       Note to Pharmacy: Janene Harvey   : cabinet override      09/18/20 1114 09/18/20 1253        Assessment/Plan POD#5 ITP/Thrombocytopenia s/p Hand-assisted laparoscopic splenectomy 6/15 Dr. Donell Beers - Received vaccines pre-op - Cont JP drain. Drain output more dark today. CT A/P today. Can't have IV contrast w/ current GFR but will give PO contrast.  - Fever workup undergoing. CT A/P as above ordered. Thora done yesterday w/ cx's pending. BC w/  MRSA but unclear if this is a contaminant? UA w/ small leukocytes, rare bacteria and 11-20 wbc. On abx - Foley for rentention - Labs pending.  - Mobilize. PT rec CIR vs SNF   FEN - Currently on CM VTE - SCD, per [facility] ID - cefotetan periop. Zosyn started 9/18 after developed fever. Foley - replace   - Per [facility] -  WC bound due to RLE BKA DM CHF HTN H/O CVA AKI - Cr 2.2 L pleural effusion s/p thora 9/19    LOS: 11 days    Jacinto Halim , [facility] Surgery 09/23/2020, 9:05 AM Please see Amion for pager number during day hours 7:00am-4:30pm

## 2020-09-23 NOTE — Progress Notes (Signed)
PROGRESS NOTE  [name] CLE:[id number] DOB: Feb 09, 1965 DOA: 09/12/2020 PCP: Annita Brod, MD   LOS: 11 days   Brief narrative:  [name] is a 55 y.o. female with medical history significant for ITP, type 2 diabetes mellitus, chronic kidney disease, chronic diastolic CHF, chronic hypoxic respiratory failure, presented to the hospital with shortness of breath, fatigue, and bruising for one month and she is now unable to get up without assistance.  Patient stated that she was out of Lasix for 1 month.  Patient was supposed to be following up with hem-oncology but had been busy and was not able to follow-up with him.  In the ED, patient was hemodynamically stable but hemoglobin was 4.5, platelet less than 5.  WBC 2.8.  Patient had mild hyperkalemia with elevated creatinine of 1.8.  Chest x-ray showed cardiomegaly with small pleural effusion and pulmonary edema.  In the ED, patient was treated with IV Lasix, Protonix, dexamethasone, and Lokelma. 2 units PRBC were ordered and was admitted to the hospital.   Assessment/Plan:  Principal Problem:   Symptomatic anemia Active Problems:   Type II diabetes mellitus (HCC)   CKD (chronic kidney disease) stage 3, GFR 30-59 ml/min (HCC)   Idiopathic thrombocytopenic purpura (ITP) (HCC)   Acute on chronic diastolic CHF (congestive heart failure) (HCC)   Pancytopenia (HCC)  Idiopathic thrombocytopenic purpura; pancytopenia; symptomatic anemia  Presented with platelet of < 5. patient received IV steroids, received Nplate and IVIG.  Oncology on board.  Received 2 units of packed RBC during hospitalization..received Aranesp and Feraheme on 09/15/2020. continue folic acid.  Patient has received pneumococcal meningococcal and haemophilus vaccination.  oncology recommended splenectomy.  General surgery consulted.  Patient underwent a splenectomy on 09/18/2020.  Platelets rising postoperatively.  Patient tolerating regular diet.  However she is having  fever so general surgery is repeating CT abdomen and pelvis for which she was not agreeable yesterday but today she is agreeable that she will drink contrast.  Continues to have drains.   Sepsis secondary to hospital-acquired pneumonia/large left pleural effusion/gram-positive bacteremia, not POA: Patient continues to have worsening leukocytosis since splenectomy and started having fever on 09/21/2020 with a T-max of 102.2, continues to have fever and last known temperature was this morning which was 101.2.  S/p left-sided thoracentesis 09/22/2020 which seems to be exudative.  Patient's breathing is better since then.  Patient's blood culture is also growing MRSE.  Seems to be real.  We will transition to cefepime from Zosyn and add vancomycin.  Await cultures from pleural fluid.  Will now resume Lovenox.  Acute on chronic anemia: Likely due to acute illness.  Hemoglobin dropped to 7.0 yesterday.  Transfuse 1 unit of PRBC.  CBC pending this morning.  Hypokalemia: Resolved.  But CMP pending this morning.  Essential hypertension: Blood pressure now fairly within normal range.  Continue Toprol-XL.  Continue to hold rest of the antihypertensives which included Imdur, amlodipine.  Acute on chronic diastolic CHF with chronic hypoxic respiratory failure  Patient was out of her Lasix at home.  She appears euvolemic.  Holding Lasix due to rising creatinine.   AKI on CKD IIIb/obstructive uropathy - SCr is 1.83 on admission, up from 1.51 in June 2022.  Creatinine remained stable around 1.2 but has started to rise since 09/21/2020 and currently 2.2.  Lasix was discontinued 09/21/2020.  Patient also having some obstructive uropathy.  Foley placed yesterday.  Hypocalcemia: Received calcium gluconate yesterday.  CMP pending today.   Type II DM  with hyperglycemia Hemoglobin A1c was 6.2% in June 2022.  Blood sugar controlled on SSI.  Continue this.  Debility, deconditioning, ambulatory dysfunction. States that she  has DME wheelchair at home for ambulation, PT recommends SNF.  Patient declines that and wants to go home.  Acute hyponatremia: CMP pending today.  DVT prophylaxis: SCDs Start: 09/12/20 1951  Code Status: Full code  Family Communication: None  Status is: Inpatient  Remains inpatient appropriate because:IV treatments appropriate due to intensity of illness or inability to take PO and Inpatient level of care appropriate due to severity of illness  Dispo: The patient is from: Home              Anticipated d/c is to: Home               Patient currently is not medically stable to d/c.   Difficult to place patient No  Consultants: Medical oncology General surgery  Procedures: PRBC transfusion III units.  Anti-infectives:  None  Anti-infectives (From admission, onward)    Start     Dose/Rate Route Frequency Ordered Stop   09/21/20 1400  piperacillin-tazobactam (ZOSYN) IVPB 3.375 g        3.375 g 12.5 mL/hr over 240 Minutes Intravenous Every 8 hours 09/21/20 1122     09/18/20 1115  cefoTEtan (CEFOTAN) 2 g in sodium chloride 0.9 % 100 mL IVPB        2 g 200 mL/hr over 30 Minutes Intravenous  Once 09/18/20 1112 09/18/20 1250   09/18/20 1114  sodium chloride 0.9 % with cefoTEtan (CEFOTAN) ADS Med       Note to Pharmacy: Janene Harvey   : cabinet override      09/18/20 1114 09/18/20 1253      Subjective: Patient seen and examined.  She states that she feels better than yesterday as far as her breathing as well as abdominal pain goes but she is concerned about continuous fever.  No other complaint.  Objective: Vitals:   09/23/20 0726 09/23/20 0847  BP: (!) 155/64 (!) 142/56  Pulse: 87   Resp: 18   Temp: (!) 101.2 F (38.4 C)   SpO2: 95%     Intake/Output Summary (Last 24 hours) at 09/23/2020 1039 Last data filed at 09/23/2020 0853 Gross per 24 hour  Intake 1240.43 ml  Output 2310 ml  Net -1069.57 ml    Filed Weights   09/21/20 0310 09/22/20 0313 09/23/20 0605   Weight: 110.2 kg 111.2 kg 110.7 kg   Body mass index is 38.22 kg/m.   Physical Exam:  General exam: Appears calm and comfortable  Respiratory system: Rhonchi at right base, diminished breath sounds at the left middle and lower lobe but slightly improved aeration compared to yesterday respiratory effort normal. Cardiovascular system: S1 & S2 heard, RRR. No JVD, murmurs, rubs, gallops or clicks. No pedal edema. Gastrointestinal system: Abdomen is nondistended, soft and generalized tenderness with JP drain in place.  No organomegaly or masses felt. Normal bowel sounds heard. Central nervous system: Alert and oriented. No focal neurological deficits. Extremities: Symmetric 5 x 5 power. Skin: No rashes, lesions or ulcers.  Psychiatry: Judgement and insight appear normal. Mood & affect appropriate.   Data Review: I have personally reviewed the following laboratory data and studies,  CBC: Recent Labs  Lab 09/19/20 0258 09/20/20 0313 09/21/20 0226 09/22/20 0752 09/22/20 2346  WBC 17.8* 21.3* 29.8* 25.1* 21.5*  NEUTROABS 17.1* 19.4* 25.3* 18.8* 16.1*  HGB 7.4* 8.1* 7.5* 7.0* 8.2*  HCT 26.1* 28.2* 25.4* 24.9* 27.0*  MCV 86.4 87.0 86.1 87.1 85.7  PLT 265 302 381 607* 638*    Basic Metabolic Panel: Recent Labs  Lab 09/19/20 0258 09/20/20 0313 09/21/20 0226 09/22/20 0752 09/22/20 2346  NA 136 134* 130* 129* 131*  K 4.6 4.5 4.4 4.1 3.7  CL 99 98 97* 96* 98  CO2 26 26 25 23 24   GLUCOSE 206* 77 84 93 118*  BUN 36* 38* 43* 46* 46*  CREATININE 1.28* 1.57* 2.05* 2.20* 2.10*  CALCIUM 7.6* 7.3* 6.5* 6.3* 6.8*  MG  --   --   --  1.3*  --     Liver Function Tests: Recent Labs  Lab 09/18/20 0420 09/19/20 0258 09/20/20 0313 09/22/20 0752 09/22/20 2346  AST 45* 54* 38 23 23  ALT 33 39 30 18 16   ALKPHOS 47 34* 43 62 67  BILITOT 0.9 1.0 0.6 0.7 0.4  PROT 6.6 5.8* 6.3* 5.7* 5.8*  ALBUMIN 2.6* 2.5* 2.6* 1.9* 2.0*    Recent Labs  Lab 09/22/20 1825  AMYLASE 75   No results  for input(s): AMMONIA in the last 168 hours. Cardiac Enzymes: No results for input(s): CKTOTAL, CKMB, CKMBINDEX, TROPONINI in the last 168 hours. BNP (last 3 results) Recent Labs    03/08/20 1309 06/12/20 0954 09/12/20 1830  BNP 824.4* 774.5* 1,139.8*     ProBNP (last 3 results) No results for input(s): PROBNP in the last 8760 hours.  CBG: Recent Labs  Lab 09/22/20 1735 09/22/20 2101 09/22/20 2355 09/23/20 0429 09/23/20 0727  GLUCAP 133* 169* 110* 80 87    Recent Results (from the past 240 hour(s))  Culture, blood (routine x 2)     Status: None (Preliminary result)   Collection Time: 09/21/20 10:57 AM   Specimen: BLOOD  Result Value Ref Range Status   Specimen Description BLOOD SITE NOT SPECIFIED  Final   Special Requests   Final    BOTTLES DRAWN AEROBIC AND ANAEROBIC Blood Culture adequate volume   Culture  Setup Time   Final    AEROBIC BOTTLE ONLY GRAM POSITIVE COCCI IN CLUSTERS CRITICAL VALUE NOTED.  VALUE IS CONSISTENT WITH PREVIOUSLY REPORTED AND CALLED VALUE.    Culture   Final    GRAM POSITIVE COCCI CULTURE REINCUBATED FOR BETTER GROWTH Performed at [facility], [address] [place], [place] [address]    Report Status PENDING  Incomplete  Culture, blood (routine x 2)     Status: Abnormal (Preliminary result)   Collection Time: 09/21/20 11:20 AM   Specimen: BLOOD RIGHT HAND  Result Value Ref Range Status   Specimen Description BLOOD RIGHT HAND  Final   Special Requests   Final    BOTTLES DRAWN AEROBIC ONLY Blood Culture adequate volume   Culture  Setup Time   Final    GRAM POSITIVE COCCI IN CLUSTERS AEROBIC BOTTLE ONLY CRITICAL RESULT CALLED TO, READ BACK BY AND VERIFIED WITHCeledonio Miyamoto PHARMD 1627 09/22/20 A BROWNING    Culture (A)  Final    STAPHYLOCOCCUS EPIDERMIDIS SUSCEPTIBILITIES TO FOLLOW Performed at [facility], [address], [place], [place] [address]    Report Status PENDING  Incomplete  Blood Culture ID Panel  (Reflexed)     Status: Abnormal   Collection Time: 09/21/20 11:20 AM  Result Value Ref Range Status   Enterococcus faecalis NOT DETECTED NOT DETECTED Final   Enterococcus Faecium NOT DETECTED NOT DETECTED Final   Listeria monocytogenes NOT DETECTED NOT  DETECTED Final   Staphylococcus species DETECTED (A) NOT DETECTED Final    Comment: CRITICAL RESULT CALLED TO, READ BACK BY AND VERIFIED WITH: Celedonio Miyamoto PHARMD 1627 09/22/20 A BROWNING    Staphylococcus aureus (BCID) NOT DETECTED NOT DETECTED Final   Staphylococcus epidermidis DETECTED (A) NOT DETECTED Final    Comment: Methicillin (oxacillin) resistant coagulase negative staphylococcus. Possible blood culture contaminant (unless isolated from more than one blood culture draw or clinical case suggests pathogenicity). No antibiotic treatment is indicated for blood  culture contaminants. CRITICAL RESULT CALLED TO, READ BACK BY AND VERIFIED WITH: Celedonio Miyamoto PHARMD 1627 09/22/20 A BROWNING    Staphylococcus lugdunensis NOT DETECTED NOT DETECTED Final   Streptococcus species NOT DETECTED NOT DETECTED Final   Streptococcus agalactiae NOT DETECTED NOT DETECTED Final   Streptococcus pneumoniae NOT DETECTED NOT DETECTED Final   Streptococcus pyogenes NOT DETECTED NOT DETECTED Final   A.calcoaceticus-baumannii NOT DETECTED NOT DETECTED Final   Bacteroides fragilis NOT DETECTED NOT DETECTED Final   Enterobacterales NOT DETECTED NOT DETECTED Final   Enterobacter cloacae complex NOT DETECTED NOT DETECTED Final   Escherichia coli NOT DETECTED NOT DETECTED Final   Klebsiella aerogenes NOT DETECTED NOT DETECTED Final   Klebsiella oxytoca NOT DETECTED NOT DETECTED Final   Klebsiella pneumoniae NOT DETECTED NOT DETECTED Final   Proteus species NOT DETECTED NOT DETECTED Final   Salmonella species NOT DETECTED NOT DETECTED Final   Serratia marcescens NOT DETECTED NOT DETECTED Final   Haemophilus influenzae NOT DETECTED NOT DETECTED Final   Neisseria  meningitidis NOT DETECTED NOT DETECTED Final   Pseudomonas aeruginosa NOT DETECTED NOT DETECTED Final   Stenotrophomonas maltophilia NOT DETECTED NOT DETECTED Final   Candida albicans NOT DETECTED NOT DETECTED Final   Candida auris NOT DETECTED NOT DETECTED Final   Candida glabrata NOT DETECTED NOT DETECTED Final   Candida krusei NOT DETECTED NOT DETECTED Final   Candida parapsilosis NOT DETECTED NOT DETECTED Final   Candida tropicalis NOT DETECTED NOT DETECTED Final   Cryptococcus neoformans/gattii NOT DETECTED NOT DETECTED Final   Methicillin resistance mecA/C DETECTED (A) NOT DETECTED Final    Comment: CRITICAL RESULT CALLED TO, READ BACK BY AND VERIFIED WITHCeledonio Miyamoto [facility] 8101 09/22/20 A BROWNING Performed at [facility], [address], [place], [place] [address]   Culture, body fluid w Gram Stain-bottle     Status: None (Preliminary result)   Collection Time: 09/22/20  4:51 PM   Specimen: Pleura  Result Value Ref Range Status   Specimen Description PLEURAL  Final   Special Requests NONE  Final   Culture   Final    NO GROWTH < 24 HOURS Performed at [facility], [address] [place], [place] [address]    Report Status PENDING  Incomplete  Gram stain     Status: None   Collection Time: 09/22/20  4:51 PM   Specimen: Pleura  Result Value Ref Range Status   Specimen Description PLEURAL  Final   Special Requests NONE  Final   Gram Stain   Final    ABUNDANT WBC PRESENT, PREDOMINANTLY PMN NO ORGANISMS SEEN Performed at [facility], [address] [place], [place] [address]    Report Status 09/22/2020 FINAL  Final      Studies: DG Chest 1 View  Result Date: 09/22/2020 CLINICAL DATA:  Status post left thoracentesis. EXAM: CHEST  1 VIEW COMPARISON:  September 21, 2020. FINDINGS: Stable cardiomegaly. Left pleural effusion is significantly smaller status  post thoracentesis. No pneumothorax is noted. IMPRESSION: No pneumothorax status post left  thoracentesis. Electronically Signed   By: Lupita Raider M.D.   On: 09/22/2020 17:02   DG CHEST PORT 1 VIEW  Result Date: 09/21/2020 CLINICAL DATA:  Chronic respiratory failure. Shortness of breath and fatigue. EXAM: PORTABLE CHEST 1 VIEW COMPARISON:  September 12, 2020 FINDINGS: Stable cardiomegaly. The hila and mediastinum are unchanged. No pneumothorax. There is opacity in the right base. Significant opacity seen on the left. Associated left effusion suspected. Mild increased interstitial markings in the right lung. IMPRESSION: 1. Significant opacification of the left chest. I suspect a moderate to large left pleural effusion with underlying atelectasis. Infiltrate not excluded. The findings are new since September 12, 2020. 2. Opacity in the right base could represent developing infiltrate or atelectasis. Recommend clinical correlation. 3. Probable mild pulmonary venous congestion. Electronically Signed   By: Gerome Sam III M.D.   On: 09/21/2020 13:46   IR THORACENTESIS ASP PLEURAL SPACE W/IMG GUIDE  Result Date: 09/22/2020 INDICATION: Pleural Effusion EXAM: ULTRASOUND GUIDED DIAGNOSTIC AND THERAPEUTIC THORACENTESIS MEDICATIONS: None. COMPLICATIONS: None immediate. PROCEDURE: An ultrasound guided thoracentesis was thoroughly discussed with the patient and questions answered. The benefits, risks, alternatives and complications were also discussed. The patient understands and wishes to proceed with the procedure. Written consent was obtained. Ultrasound was performed to localize and mark an adequate pocket of fluid in the left chest. The area was then prepped and draped in the normal sterile fashion. 1% Lidocaine was used for local anesthesia. Under ultrasound guidance a 8 Fr Safe-T-Centesis catheter was introduced. Thoracentesis was performed. The catheter was removed and a dressing applied. FINDINGS: A total of approximately 500cc of dark yellow fluid was removed. Samples were sent to the laboratory  as requested by the clinical team. IMPRESSION: Successful ultrasound guided diagnostic and therapeutic thoracentesis yielding 500cc of pleural fluid. Electronically Signed   By: Marliss Coots M.D.   On: 09/22/2020 16:37     Total time spent in minutes: 30 Hughie Closs, MD  [facility] 09/23/2020  If 7PM-7AM, please contact night-coverage

## 2020-09-24 ENCOUNTER — Inpatient Hospital Stay (HOSPITAL_COMMUNITY): Payer: Medicare Other

## 2020-09-24 DIAGNOSIS — D693 Immune thrombocytopenic purpura: Secondary | ICD-10-CM | POA: Diagnosis not present

## 2020-09-24 DIAGNOSIS — N183 Chronic kidney disease, stage 3 unspecified: Secondary | ICD-10-CM | POA: Diagnosis not present

## 2020-09-24 DIAGNOSIS — J181 Lobar pneumonia, unspecified organism: Secondary | ICD-10-CM | POA: Diagnosis not present

## 2020-09-24 DIAGNOSIS — R933 Abnormal findings on diagnostic imaging of other parts of digestive tract: Secondary | ICD-10-CM | POA: Diagnosis not present

## 2020-09-24 DIAGNOSIS — D649 Anemia, unspecified: Secondary | ICD-10-CM | POA: Diagnosis not present

## 2020-09-24 DIAGNOSIS — D509 Iron deficiency anemia, unspecified: Secondary | ICD-10-CM

## 2020-09-24 DIAGNOSIS — R7881 Bacteremia: Secondary | ICD-10-CM

## 2020-09-24 DIAGNOSIS — R509 Fever, unspecified: Secondary | ICD-10-CM | POA: Diagnosis not present

## 2020-09-24 LAB — CBC WITH DIFFERENTIAL/PLATELET
Abs Immature Granulocytes: 0.14 10*3/uL — ABNORMAL HIGH (ref 0.00–0.07)
Basophils Absolute: 0.1 10*3/uL (ref 0.0–0.1)
Basophils Relative: 0 %
Eosinophils Absolute: 0.2 10*3/uL (ref 0.0–0.5)
Eosinophils Relative: 1 %
HCT: 31.9 % — ABNORMAL LOW (ref 36.0–46.0)
Hemoglobin: 9.2 g/dL — ABNORMAL LOW (ref 12.0–15.0)
Immature Granulocytes: 1 %
Lymphocytes Relative: 4 %
Lymphs Abs: 0.6 10*3/uL — ABNORMAL LOW (ref 0.7–4.0)
MCH: 25.2 pg — ABNORMAL LOW (ref 26.0–34.0)
MCHC: 28.8 g/dL — ABNORMAL LOW (ref 30.0–36.0)
MCV: 87.4 fL (ref 80.0–100.0)
Monocytes Absolute: 3.4 10*3/uL — ABNORMAL HIGH (ref 0.1–1.0)
Monocytes Relative: 20 %
Neutro Abs: 12.6 10*3/uL — ABNORMAL HIGH (ref 1.7–7.7)
Neutrophils Relative %: 74 %
Platelets: 757 10*3/uL — ABNORMAL HIGH (ref 150–400)
RBC: 3.65 MIL/uL — ABNORMAL LOW (ref 3.87–5.11)
RDW: 23.2 % — ABNORMAL HIGH (ref 11.5–15.5)
WBC: 17 10*3/uL — ABNORMAL HIGH (ref 4.0–10.5)
nRBC: 0.2 % (ref 0.0–0.2)

## 2020-09-24 LAB — CULTURE, BLOOD (ROUTINE X 2): Special Requests: ADEQUATE

## 2020-09-24 LAB — GLUCOSE, CAPILLARY
Glucose-Capillary: 100 mg/dL — ABNORMAL HIGH (ref 70–99)
Glucose-Capillary: 101 mg/dL — ABNORMAL HIGH (ref 70–99)
Glucose-Capillary: 110 mg/dL — ABNORMAL HIGH (ref 70–99)
Glucose-Capillary: 134 mg/dL — ABNORMAL HIGH (ref 70–99)
Glucose-Capillary: 165 mg/dL — ABNORMAL HIGH (ref 70–99)
Glucose-Capillary: 96 mg/dL (ref 70–99)

## 2020-09-24 LAB — COMPREHENSIVE METABOLIC PANEL
ALT: 16 U/L (ref 0–44)
AST: 24 U/L (ref 15–41)
Albumin: 2.2 g/dL — ABNORMAL LOW (ref 3.5–5.0)
Alkaline Phosphatase: 83 U/L (ref 38–126)
Anion gap: 12 (ref 5–15)
BUN: 41 mg/dL — ABNORMAL HIGH (ref 6–20)
CO2: 22 mmol/L (ref 22–32)
Calcium: 7.5 mg/dL — ABNORMAL LOW (ref 8.9–10.3)
Chloride: 101 mmol/L (ref 98–111)
Creatinine, Ser: 2.06 mg/dL — ABNORMAL HIGH (ref 0.44–1.00)
GFR, Estimated: 28 mL/min — ABNORMAL LOW (ref >60)
Glucose, Bld: 122 mg/dL — ABNORMAL HIGH (ref 70–99)
Potassium: 3.9 mmol/L (ref 3.5–5.1)
Sodium: 135 mmol/L (ref 135–145)
Total Bilirubin: 0.6 mg/dL (ref 0.3–1.2)
Total Protein: 6.4 g/dL — ABNORMAL LOW (ref 6.5–8.1)

## 2020-09-24 LAB — CYTOLOGY - NON PAP

## 2020-09-24 LAB — LACTIC ACID, PLASMA: Lactic Acid, Venous: 1.1 mmol/L (ref 0.5–1.9)

## 2020-09-24 LAB — PROTIME-INR
INR: 1.3 — ABNORMAL HIGH (ref 0.8–1.2)
Prothrombin Time: 15.8 seconds — ABNORMAL HIGH (ref 11.4–15.2)

## 2020-09-24 MED ORDER — ENOXAPARIN SODIUM 40 MG/0.4ML IJ SOSY: 40.0000 mg | PREFILLED_SYRINGE | INTRAMUSCULAR | Status: DC

## 2020-09-24 MED ORDER — FUROSEMIDE 40 MG PO TABS
40.0000 mg | ORAL_TABLET | Freq: Two times a day (BID) | ORAL | Status: DC
  Administered 2020-09-24 – 2020-10-02 (×17): 40 mg via ORAL
  Filled 2020-09-24 (×17): qty 1

## 2020-09-24 NOTE — Progress Notes (Signed)
6 Days Post-Op   Subjective/Chief Complaint: Events of night noted Patient currently eating breakfast, denies abdominal pain   Objective: Vital signs in last 24 hours: Temp:  [98.3 F (36.8 C)-102.1 F (38.9 C)] 98.8 F (37.1 C) (09/21 0727) Pulse Rate:  [76-97] 76 (09/21 0727) Resp:  [14-20] 15 (09/21 0727) BP: (126-154)/(49-60) 131/60 (09/21 0727) SpO2:  [93 %-100 %] 97 % (09/21 0727) Weight:  [112.1 kg] 112.1 kg (09/21 0414) Last BM Date: 09/23/20  Intake/Output from previous day: 09/20 0701 - 09/21 0700 In: 480 [P.O.:480] Out: 1400 [Urine:1400] Intake/Output this shift: No intake/output data recorded.  Exam: Awake and alert Abdomen soft, drain serous, leaking around exit site  Lab Results:  Recent Labs    09/23/20 2008 09/24/20 0342  WBC 17.9* 17.0*  HGB 8.4* 9.2*  HCT 28.7* 31.9*  PLT 723* 757*   BMET Recent Labs    09/23/20 2008 09/24/20 0342  NA 135 135  K 3.9 3.9  CL 102 101  CO2 22 22  GLUCOSE 168* 122*  BUN 44* 41*  CREATININE 2.14* 2.06*  CALCIUM 7.1* 7.5*   PT/INR No results for input(s): LABPROT, INR in the last 72 hours. ABG Recent Labs    09/23/20 1928  PHART 7.265*  HCO3 20.2    Studies/Results: CT ABDOMEN PELVIS WO CONTRAST  Result Date: 09/23/2020 CLINICAL DATA:  Abdominal pain. Fever. Postop. Recent laparoscopic splenectomy. EXAM: CT ABDOMEN AND PELVIS WITHOUT CONTRAST TECHNIQUE: Multidetector CT imaging of the abdomen and pelvis was performed following the standard protocol without IV contrast. COMPARISON:  Renal ultrasound 06/12/2020. Abdominopelvic CT 06/26/2019 FINDINGS: Lower chest: Consolidation throughout the included left lower lobe with small pleural effusion. There is enteric contrast in the distal esophagus. Trace right pleural effusion. Hepatobiliary: Prominent size liver spans 19.3 cm cranial caudal. Mild subjective hepatic steatosis. Focal hepatic abnormality on this unenhanced exam. The gallbladder is decompressed.  No visualized calcified gallstone. No biliary dilatation Pancreas: Atrophic.  No ductal dilatation or inflammation. Spleen: Splenectomy. Surgical drain in the left upper quadrant. Small amount of ill-defined fluid and soft tissue density adjacent to the drain without organized collection. Adrenals/Urinary Tract: No adrenal nodule. No hydronephrosis. Mild bilateral perinephric edema. No visualized renal stone or focal lesion. Calcifications at the renal hila are felt to be vascular. Urinary bladder is decompressed by Foley catheter. Stomach/Bowel: Enteric contrast is seen in the distal esophagus. The stomach is physiologically distended. There is no gastric wall thickening. No obstruction, enteric contrast is seen in the colon. Majority of the small bowel is decompressed. No small bowel inflammation. Normal appendix. Irregular wall thickening of the cecum, series 3, image 73, series 6, image 48. This measures up to 13 mm. No colonic inflammation. Vascular/Lymphatic: Advanced aortic and branch atherosclerosis. No aortic aneurysm. No portal venous or mesenteric gas. No enlarged lymph nodes in the abdomen or pelvis. Reproductive: Unremarkable appearance of the uterus. The ovaries are not well-defined, quiescent appearance of the right ovary. The left ovary is not well seen. No suspicious adnexal mass. Other: Generalized body wall edema, confluent in the left flank. No organized subcutaneous collection. No free air. Minimal ill-defined fluid and soft tissue density in the left upper quadrant with organized abscess or collection. Musculoskeletal: There are no acute or suspicious osseous abnormalities. IMPRESSION: 1. Recent splenectomy. Surgical drain in the left upper quadrant with small amount of ill-defined fluid and soft tissue density adjacent to the drain without organized collection or abscess. 2. Irregular wall thickening of the cecum. If not performed  recently, recommend direct visualization with colonoscopy to  exclude colonic neoplasm. 3. Consolidation throughout the included left lower lobe with small pleural effusion. Trace right pleural effusion. 4. Anasarca with generalized body wall edema, confluent in the left abdomen and flank. 5. Enteric contrast in the distal esophagus, can be seen with reflux or slow transit. Aortic Atherosclerosis (ICD10-I70.0). Electronically Signed   By: Narda Rutherford M.D.   On: 09/23/2020 17:20   DG Chest 1 View  Result Date: 09/22/2020 CLINICAL DATA:  Status post left thoracentesis. EXAM: CHEST  1 VIEW COMPARISON:  September 21, 2020. FINDINGS: Stable cardiomegaly. Left pleural effusion is significantly smaller status post thoracentesis. No pneumothorax is noted. IMPRESSION: No pneumothorax status post left thoracentesis. Electronically Signed   By: Lupita Raider M.D.   On: 09/22/2020 17:02   CT HEAD WO CONTRAST ( )  Result Date: 09/23/2020 CLINICAL DATA:  Altered mental status. EXAM: CT HEAD WITHOUT CONTRAST TECHNIQUE: Contiguous axial images were obtained from the base of the skull through the vertex without intravenous contrast. COMPARISON:  July 30, 2019 FINDINGS: Brain: No evidence of acute hemorrhage, hydrocephalus, extra-axial collection or mass lesion/mass effect. A large area of encephalomalacia is again seen within the left temporal and left parietal regions. Associated ex vacuo dilatation of the posterior aspect of the left lateral ventricle is noted. Vascular: No hyperdense vessel or unexpected calcification. Skull: A left frontal parietal craniotomy defect is seen. Sinuses/Orbits: No acute finding. Other: None. IMPRESSION: 1. No acute intracranial abnormality. 2. Evidence of prior left frontoparietal craniotomy with stable left temporal and left parietal lobe encephalomalacia. Electronically Signed   By: Aram Candela M.D.   On: 09/23/2020 22:32   DG CHEST PORT 1 VIEW  Result Date: 09/23/2020 CLINICAL DATA:  Shortness of breath. EXAM: PORTABLE CHEST 1  VIEW COMPARISON:  Radiograph yesterday. Lung bases from abdominopelvic CT earlier today. FINDINGS: Left basilar consolidation. Slight increased opacity in the left mid lung zone from radiograph yesterday, suggesting layering effusion. Cardiomegaly is stable. Trace right pleural effusion on CT is not well seen. No pneumothorax. No pulmonary edema. IMPRESSION: Left basilar consolidation. Slight increased opacity in the left mid lung zone from radiograph yesterday, suggesting layering effusion. Electronically Signed   By: Narda Rutherford M.D.   On: 09/23/2020 20:02   IR THORACENTESIS ASP PLEURAL SPACE W/IMG GUIDE  Result Date: 09/22/2020 INDICATION: Pleural Effusion EXAM: ULTRASOUND GUIDED DIAGNOSTIC AND THERAPEUTIC THORACENTESIS MEDICATIONS: None. COMPLICATIONS: None immediate. PROCEDURE: An ultrasound guided thoracentesis was thoroughly discussed with the patient and questions answered. The benefits, risks, alternatives and complications were also discussed. The patient understands and wishes to proceed with the procedure. Written consent was obtained. Ultrasound was performed to localize and mark an adequate pocket of fluid in the left chest. The area was then prepped and draped in the normal sterile fashion. 1% Lidocaine was used for local anesthesia. Under ultrasound guidance a 8 Fr Safe-T-Centesis catheter was introduced. Thoracentesis was performed. The catheter was removed and a dressing applied. FINDINGS: A total of approximately 500cc of dark yellow fluid was removed. Samples were sent to the laboratory as requested by the clinical team. IMPRESSION: Successful ultrasound guided diagnostic and therapeutic thoracentesis yielding 500cc of pleural fluid. Electronically Signed   By: Marliss Coots M.D.   On: 09/22/2020 16:37    Anti-infectives: Anti-infectives (From admission, onward)    Start     Dose/Rate Route Frequency Ordered Stop   09/25/20 1200  vancomycin (VANCOREADY) IVPB 1750 mg/350 mL  1,750 mg 175 mL/hr over 120 Minutes Intravenous Every 48 hours 09/23/20 1056     09/23/20 1400  ceFEPIme (MAXIPIME) 2 g in sodium chloride 0.9 % 100 mL IVPB        2 g 200 mL/hr over 30 Minutes Intravenous Every 12 hours 09/23/20 1047     09/23/20 1400  metroNIDAZOLE (FLAGYL) tablet 500 mg        500 mg Oral Every 12 hours 09/23/20 1047     09/23/20 1200  vancomycin (VANCOREADY) IVPB 2000 mg/400 mL        2,000 mg 200 mL/hr over 120 Minutes Intravenous  Once 09/23/20 1047 09/23/20 1326   09/21/20 1400  piperacillin-tazobactam (ZOSYN) IVPB 3.375 g  Status:  Discontinued        3.375 g 12.5 mL/hr over 240 Minutes Intravenous Every 8 hours 09/21/20 1122 09/23/20 1047   09/18/20 1115  cefoTEtan (CEFOTAN) 2 g in sodium chloride 0.9 % 100 mL IVPB        2 g 200 mL/hr over 30 Minutes Intravenous  Once 09/18/20 1112 09/18/20 1250   09/18/20 1114  sodium chloride 0.9 % with cefoTEtan (CEFOTAN) ADS Med       Note to Pharmacy: Janene Harvey   : cabinet override      09/18/20 1114 09/18/20 1253       Assessment/Plan: s/p Procedure(s) with comments: HAND ASSISTED LAPAROSCOPIC SPLENECTOMY (N/A) - 120 MINUTES  CT scan of abdomen shows only post op changes.  Nothing worrisome for abscess or bowel injury Will continue current care from a surgery standpoint  LOS: 12 days    [name] 09/24/2020

## 2020-09-24 NOTE — TOC CM/SW Note (Signed)
Patient possible CIR placement. IP rehab received referral and following for placement.      Isidoro Donning RN3 CCM, Heart Failure TOC CM [phone]

## 2020-09-24 NOTE — Progress Notes (Signed)
Inpatient Rehabilitation Admissions Coordinator   Inpatient rehab consult received. I met with patient , her Mom and 54 year old son at bedside. 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 She is hopeful to progress for direct discharge home. She is currently not at  level to pursue CIR admit with [facility], nor does patient feel she can do the intensity required of CIR. I will follow up on Friday with her progress to further discuss her progress and plans for dispo.  Danne Baxter, RN, MSN Rehab Admissions Coordinator [phone] 09/24/2020 3:24 PM

## 2020-09-24 NOTE — Progress Notes (Signed)
PROGRESS NOTE  [name] OFB:[id number] DOB: 1965-03-05 DOA: 09/12/2020 PCP: Annita Brod, MD   LOS: 12 days   Brief narrative:  [name] is a 55 y.o. female with medical history significant for ITP, type 2 diabetes mellitus, chronic kidney disease, chronic diastolic CHF, chronic hypoxic respiratory failure, presented to the hospital with shortness of breath, fatigue, and bruising for one month and she is now unable to get up without assistance.  Patient stated that she was out of Lasix for 1 month.  Patient was supposed to be following up with hem-oncology but had been busy and was not able to follow-up with him.  In the ED, patient was hemodynamically stable but hemoglobin was 4.5, platelet less than 5.  WBC 2.8.  Patient had mild hyperkalemia with elevated creatinine of 1.8.  Chest x-ray showed cardiomegaly with small pleural effusion and pulmonary edema.  In the ED, patient was treated with IV Lasix, Protonix, dexamethasone, and Lokelma. 2 units PRBC were ordered and was admitted to the hospital.   Assessment/Plan:  Principal Problem:   Symptomatic anemia Active Problems:   Type II diabetes mellitus (HCC)   CKD (chronic kidney disease) stage 3, GFR 30-59 ml/min (HCC)   Idiopathic thrombocytopenic purpura (ITP) (HCC)   Acute on chronic diastolic CHF (congestive heart failure) (HCC)   Pancytopenia (HCC)  Idiopathic thrombocytopenic purpura; pancytopenia; symptomatic anemia  Presented with platelet of < 5. patient received IV steroids, received Nplate and IVIG.  Oncology on board.  Received 2 units of packed RBC during hospitalization..received Aranesp and Feraheme on 09/15/2020. continue folic acid.  Patient has received pneumococcal meningococcal and haemophilus vaccination.  oncology recommended splenectomy.  General surgery consulted.  Patient underwent a splenectomy on 09/18/2020.  Platelets rising postoperatively as expected.  Due to persistent fever, CT abdomen and  pelvis was repeated yesterday which showed regular expected postop changes however also showed irregular cecal wall thickening.    Sepsis secondary to hospital-acquired pneumonia/large left pleural effusion/gram-positive bacteremia, not POA: Patient continues to have fever since 09/21/2020 with latest temperature at 4 PM yesterday of 102.1.  Leukocytosis is improving.  S/p left-sided thoracentesis 09/22/2020 which seems to be exudative.  Chest x-ray improved after that but repeat chest x-ray shows once again accumulation of the pleural effusion on the left side.  She was requiring up to 6 L of oxygen earlier today.  Now only on 3 to 4 L.  Has diminished breath sounds.  I wonder if this is empyema.  I have consulted IR to take a look at the imaging studies 1 more time, if this is empyema, to place chest tube, if simple pleural effusion then to do another thoracentesis.  Patient's blood culture is also growing MRSE.  Antibiotics were transitioned from cefepime from Zosyn and vancomycin added on 09/23/2020.  Pleural fluid culture negative.  I have consulted ID to help as well.  Acute on chronic anemia: Likely due to acute illness.  Hemoglobin dropped to 7.0 on 09/22/2020, s/p 1 unit WBC.  Hemoglobin improved posttransfusion as expected and has remained stable.  Cecal wall thickening: CT abdomen shows cecal wall thickening.  I have consulted GI for consideration of colonoscopy.  Hypokalemia: Resolved.   Essential hypertension: Blood pressure now fairly within normal range.  Continue Toprol-XL.  Continue to hold rest of the antihypertensives which included Imdur, amlodipine.  Acute on chronic diastolic CHF with chronic hypoxic respiratory failure  Patient was out of her Lasix at home.  Appears volume overloaded.  Now  that her creatinine has stabilized, will resume Lasix.  It appears that she was taking 160 mg of Lasix daily.  I will initiate at 40 mg p.o. twice daily.  Monitor renal function closely.   AKI on  CKD IIIb/obstructive uropathy - SCr is 1.83 on admission, up from 1.51 in June 2022.  Creatinine remained stable around 1.2 but has started to rise since 09/21/2020 and currently 2.2.  Lasix was discontinued 09/21/2020.  Patient also having some obstructive uropathy.  Foley placed yesterday.  Will resume Lasix now.  Hypocalcemia: Corrected calcium within normal range.   Type II DM with hyperglycemia Hemoglobin A1c was 6.2% in June 2022.  Blood sugar controlled on SSI.  Continue this.  Debility, deconditioning, ambulatory dysfunction. States that she has DME wheelchair at home for ambulation, PT recommends SNF.  Patient declines that and wants to go home.  Acute hyponatremia: Resolved.  DVT prophylaxis: enoxaparin (LOVENOX) injection 40 mg Start: 09/25/20 1200 SCDs Start: 09/12/20 1951  Code Status: Full code  Family Communication: None  Status is: Inpatient  Remains inpatient appropriate because:IV treatments appropriate due to intensity of illness or inability to take PO and Inpatient level of care appropriate due to severity of illness  Dispo: The patient is from: Home              Anticipated d/c is to: Home               Patient currently is not medically stable to d/c.   Difficult to place patient No  Consultants: Medical oncology General surgery ID GI  Procedures: As above  Anti-infectives:    Anti-infectives (From admission, onward)    Start     Dose/Rate Route Frequency Ordered Stop   09/25/20 1200  vancomycin (VANCOREADY) IVPB 1750 mg/350 mL        1,750 mg 175 mL/hr over 120 Minutes Intravenous Every 48 hours 09/23/20 1056     09/23/20 1400  ceFEPIme (MAXIPIME) 2 g in sodium chloride 0.9 % 100 mL IVPB        2 g 200 mL/hr over 30 Minutes Intravenous Every 12 hours 09/23/20 1047     09/23/20 1400  metroNIDAZOLE (FLAGYL) tablet 500 mg  Status:  Discontinued        500 mg Oral Every 12 hours 09/23/20 1047 09/24/20 1028   09/23/20 1200  vancomycin (VANCOREADY)  IVPB 2000 mg/400 mL        2,000 mg 200 mL/hr over 120 Minutes Intravenous  Once 09/23/20 1047 09/23/20 1326   09/21/20 1400  piperacillin-tazobactam (ZOSYN) IVPB 3.375 g  Status:  Discontinued        3.375 g 12.5 mL/hr over 240 Minutes Intravenous Every 8 hours 09/21/20 1122 09/23/20 1047   09/18/20 1115  cefoTEtan (CEFOTAN) 2 g in sodium chloride 0.9 % 100 mL IVPB        2 g 200 mL/hr over 30 Minutes Intravenous  Once 09/18/20 1112 09/18/20 1250   09/18/20 1114  sodium chloride 0.9 % with cefoTEtan (CEFOTAN) ADS Med       Note to Pharmacy: Janene Harvey   : cabinet override      09/18/20 1114 09/18/20 1253      Subjective: Patient seen and examined.  She is fully alert and oriented.  She is herself surprised about the event happened yesterday when she was not responding.  She has no complaints at this point in time.  Objective: Vitals:   09/24/20 0727 09/24/20 1109  BP: 131/60 Marland Kitchen)  134/50  Pulse: 76 79  Resp: 15 20  Temp: 98.8 F (37.1 C) 99.1 F (37.3 C)  SpO2: 97% 99%    Intake/Output Summary (Last 24 hours) at 09/24/2020 1116 Last data filed at 09/24/2020 0500 Gross per 24 hour  Intake 240 ml  Output 1400 ml  Net -1160 ml    Filed Weights   09/22/20 0313 09/23/20 0605 09/24/20 0414  Weight: 111.2 kg 110.7 kg 112.1 kg   Body mass index is 38.71 kg/m.   Physical Exam:  General exam: Appears calm and comfortable  Respiratory system: Diminished breath sounds at the left base and middle lobe with some rhonchi of the middle lobe. Respiratory effort normal. Cardiovascular system: S1 & S2 heard, RRR. No JVD, murmurs, rubs, gallops or clicks.  +1 pitting edema left lower extremity. Gastrointestinal system: Abdomen is nondistended, soft and nontender. No organomegaly or masses felt. Normal bowel sounds heard. Central nervous system: Alert and oriented. No focal neurological deficits. Extremities: Right BKA.  Skin: No rashes, lesions or ulcers.  Psychiatry: Judgement and  insight appear normal. Mood & affect appropriate.   Data Review: I have personally reviewed the following laboratory data and studies,  CBC: Recent Labs  Lab 09/21/20 0226 09/22/20 0752 09/22/20 2346 09/23/20 0939 09/23/20 2008 09/24/20 0342  WBC 29.8* 25.1* 21.5* 19.0* 17.9* 17.0*  NEUTROABS 25.3* 18.8* 16.1*  --  14.9* 12.6*  HGB 7.5* 7.0* 8.2* 8.6* 8.4* 9.2*  HCT 25.4* 24.9* 27.0* 29.0* 28.7* 31.9*  MCV 86.1 87.1 85.7 86.6 87.2 87.4  PLT 381 607* 638* 637* 723* 757*    Basic Metabolic Panel: Recent Labs  Lab 09/22/20 0752 09/22/20 2346 09/23/20 0939 09/23/20 2008 09/24/20 0342  NA 129* 131* 134* 135 135  K 4.1 3.7 4.4 3.9 3.9  CL 96* 98 102 102 101  CO2 23 24 21* 22 22  GLUCOSE 93 118* 124* 168* 122*  BUN 46* 46* 45* 44* 41*  CREATININE 2.20* 2.10* 1.99* 2.14* 2.06*  CALCIUM 6.3* 6.8* 7.2* 7.1* 7.5*  MG 1.3*  --   --   --   --     Liver Function Tests: Recent Labs  Lab 09/22/20 0752 09/22/20 2346 09/23/20 0939 09/23/20 2008 09/24/20 0342  AST 23 23 33 28 24  ALT ALKPHOS 62 67 69 75 83  BILITOT 0.7 0.4 0.6 0.5 0.6  PROT 5.7* 5.8* 5.9* 6.2* 6.4*  ALBUMIN 1.9* 2.0* 2.0* 2.0* 2.2*    Recent Labs  Lab 09/22/20 1825  AMYLASE 75    No results for input(s): AMMONIA in the last 168 hours. Cardiac Enzymes: No results for input(s): CKTOTAL, CKMB, CKMBINDEX, TROPONINI in the last 168 hours. BNP (last 3 results) Recent Labs    06/12/20 0954 09/12/20 1830 09/23/20 2008  BNP 774.5* 1,139.8* 1,080.6*     ProBNP (last 3 results) No results for input(s): PROBNP in the last 8760 hours.  CBG: Recent Labs  Lab 09/23/20 2040 09/23/20 2340 09/24/20 0351 09/24/20 0726 09/24/20 1110  GLUCAP 166* 105* 165* 96 110*    Recent Results (from the past 240 hour(s))  Culture, blood (routine x 2)     Status: Abnormal (Preliminary result)   Collection Time: 09/21/20 10:57 AM   Specimen: BLOOD  Result Value Ref Range Status   Specimen  Description BLOOD SITE NOT SPECIFIED  Final   Special Requests   Final    BOTTLES DRAWN AEROBIC AND ANAEROBIC Blood Culture adequate volume   Culture  Setup Time   Final    AEROBIC BOTTLE ONLY GRAM POSITIVE COCCI IN CLUSTERS CRITICAL VALUE NOTED.  VALUE IS CONSISTENT WITH PREVIOUSLY REPORTED AND CALLED VALUE.    Culture (A)  Final    STAPHYLOCOCCUS EPIDERMIDIS CULTURE REINCUBATED FOR BETTER GROWTH Performed at [facility], [address] [place], [place] [address]    Report Status PENDING  Incomplete  Culture, blood (routine x 2)     Status: Abnormal   Collection Time: 09/21/20 11:20 AM   Specimen: BLOOD RIGHT HAND  Result Value Ref Range Status   Specimen Description BLOOD RIGHT HAND  Final   Special Requests   Final    BOTTLES DRAWN AEROBIC ONLY Blood Culture adequate volume   Culture  Setup Time   Final    GRAM POSITIVE COCCI IN CLUSTERS AEROBIC BOTTLE ONLY CRITICAL RESULT CALLED TO, READ BACK BY AND VERIFIED WITHCeledonio Miyamoto [facility] 8469 09/22/20 A BROWNING Performed at [facility], [address], [place], [place] [address]    Culture STAPHYLOCOCCUS EPIDERMIDIS (A)  Final   Report Status 09/24/2020 FINAL  Final   Organism ID, Bacteria STAPHYLOCOCCUS EPIDERMIDIS  Final      Susceptibility   Staphylococcus epidermidis - MIC*    CIPROFLOXACIN <=0.5 SENSITIVE Sensitive     ERYTHROMYCIN >=8 RESISTANT Resistant     GENTAMICIN <=0.5 SENSITIVE Sensitive     OXACILLIN >=4 RESISTANT Resistant     TETRACYCLINE >=16 RESISTANT Resistant     VANCOMYCIN 2 SENSITIVE Sensitive     TRIMETH/SULFA <=10 SENSITIVE Sensitive     CLINDAMYCIN <=0.25 SENSITIVE Sensitive     RIFAMPIN <=0.5 SENSITIVE Sensitive     Inducible Clindamycin NEGATIVE Sensitive     * STAPHYLOCOCCUS EPIDERMIDIS  Blood Culture ID Panel (Reflexed)     Status: Abnormal   Collection Time: 09/21/20 11:20 AM  Result Value Ref Range Status   Enterococcus faecalis NOT DETECTED NOT DETECTED Final   Enterococcus  Faecium NOT DETECTED NOT DETECTED Final   Listeria monocytogenes NOT DETECTED NOT DETECTED Final   Staphylococcus species DETECTED (A) NOT DETECTED Final    Comment: CRITICAL RESULT CALLED TO, READ BACK BY AND VERIFIED WITH: Celedonio Miyamoto PHARMD 1627 09/22/20 A BROWNING    Staphylococcus aureus (BCID) NOT DETECTED NOT DETECTED Final   Staphylococcus epidermidis DETECTED (A) NOT DETECTED Final    Comment: Methicillin (oxacillin) resistant coagulase negative staphylococcus. Possible blood culture contaminant (unless isolated from more than one blood culture draw or clinical case suggests pathogenicity). No antibiotic treatment is indicated for blood  culture contaminants. CRITICAL RESULT CALLED TO, READ BACK BY AND VERIFIED WITH: Celedonio Miyamoto PHARMD 1627 09/22/20 A BROWNING    Staphylococcus lugdunensis NOT DETECTED NOT DETECTED Final   Streptococcus species NOT DETECTED NOT DETECTED Final   Streptococcus agalactiae NOT DETECTED NOT DETECTED Final   Streptococcus pneumoniae NOT DETECTED NOT DETECTED Final   Streptococcus pyogenes NOT DETECTED NOT DETECTED Final   A.calcoaceticus-baumannii NOT DETECTED NOT DETECTED Final   Bacteroides fragilis NOT DETECTED NOT DETECTED Final   Enterobacterales NOT DETECTED NOT DETECTED Final   Enterobacter cloacae complex NOT DETECTED NOT DETECTED Final   Escherichia coli NOT DETECTED NOT DETECTED Final   Klebsiella aerogenes NOT DETECTED NOT DETECTED Final   Klebsiella oxytoca NOT DETECTED NOT DETECTED Final   Klebsiella pneumoniae NOT DETECTED NOT DETECTED Final   Proteus species NOT DETECTED NOT DETECTED Final   Salmonella species NOT DETECTED NOT DETECTED Final   Serratia marcescens NOT DETECTED NOT DETECTED  Final   Haemophilus influenzae NOT DETECTED NOT DETECTED Final   Neisseria meningitidis NOT DETECTED NOT DETECTED Final   Pseudomonas aeruginosa NOT DETECTED NOT DETECTED Final   Stenotrophomonas maltophilia NOT DETECTED NOT DETECTED Final    Candida albicans NOT DETECTED NOT DETECTED Final   Candida auris NOT DETECTED NOT DETECTED Final   Candida glabrata NOT DETECTED NOT DETECTED Final   Candida krusei NOT DETECTED NOT DETECTED Final   Candida parapsilosis NOT DETECTED NOT DETECTED Final   Candida tropicalis NOT DETECTED NOT DETECTED Final   Cryptococcus neoformans/gattii NOT DETECTED NOT DETECTED Final   Methicillin resistance mecA/C DETECTED (A) NOT DETECTED Final    Comment: CRITICAL RESULT CALLED TO, READ BACK BY AND VERIFIED WITHCeledonio Miyamoto [facility] 4259 09/22/20 A BROWNING Performed at [facility], [address], [place], [place] [address]   Culture, body fluid w Gram Stain-bottle     Status: None (Preliminary result)   Collection Time: 09/22/20  4:51 PM   Specimen: Pleura  Result Value Ref Range Status   Specimen Description PLEURAL  Final   Special Requests NONE  Final   Culture   Final    NO GROWTH 2 DAYS Performed at [facility], [address] [place], [place] [address]    Report Status PENDING  Incomplete  Gram stain     Status: None   Collection Time: 09/22/20  4:51 PM   Specimen: Pleura  Result Value Ref Range Status   Specimen Description PLEURAL  Final   Special Requests NONE  Final   Gram Stain   Final    ABUNDANT WBC PRESENT, PREDOMINANTLY PMN NO ORGANISMS SEEN Performed at [name], [address] [place], [place] [address]    Report Status 09/22/2020 FINAL  Final      Studies: CT ABDOMEN PELVIS WO CONTRAST  Result Date: 09/23/2020 CLINICAL DATA:  Abdominal pain. Fever. Postop. Recent laparoscopic splenectomy. EXAM: CT ABDOMEN AND PELVIS WITHOUT CONTRAST TECHNIQUE: Multidetector CT imaging of the abdomen and pelvis was performed following the standard protocol without IV contrast. COMPARISON:  Renal ultrasound 06/12/2020. Abdominopelvic CT 06/26/2019 FINDINGS: Lower chest: Consolidation throughout the included left lower lobe with small pleural effusion. There is  enteric contrast in the distal esophagus. Trace right pleural effusion. Hepatobiliary: Prominent size liver spans 19.3 cm cranial caudal. Mild subjective hepatic steatosis. Focal hepatic abnormality on this unenhanced exam. The gallbladder is decompressed. No visualized calcified gallstone. No biliary dilatation Pancreas: Atrophic.  No ductal dilatation or inflammation. Spleen: Splenectomy. Surgical drain in the left upper quadrant. Small amount of ill-defined fluid and soft tissue density adjacent to the drain without organized collection. Adrenals/Urinary Tract: No adrenal nodule. No hydronephrosis. Mild bilateral perinephric edema. No visualized renal stone or focal lesion. Calcifications at the renal hila are felt to be vascular. Urinary bladder is decompressed by Foley catheter. Stomach/Bowel: Enteric contrast is seen in the distal esophagus. The stomach is physiologically distended. There is no gastric wall thickening. No obstruction, enteric contrast is seen in the colon. Majority of the small bowel is decompressed. No small bowel inflammation. Normal appendix. Irregular wall thickening of the cecum, series 3, image 73, series 6, image 48. This measures up to 13 mm. No colonic inflammation. Vascular/Lymphatic: Advanced aortic and branch atherosclerosis. No aortic aneurysm. No portal venous or mesenteric gas. No enlarged lymph nodes in the abdomen or pelvis. Reproductive: Unremarkable appearance of the uterus. The ovaries are not well-defined, quiescent appearance of the right ovary. The left  ovary is not well seen. No suspicious adnexal mass. Other: Generalized body wall edema, confluent in the left flank. No organized subcutaneous collection. No free air. Minimal ill-defined fluid and soft tissue density in the left upper quadrant with organized abscess or collection. Musculoskeletal: There are no acute or suspicious osseous abnormalities. IMPRESSION: 1. Recent splenectomy. Surgical drain in the left upper  quadrant with small amount of ill-defined fluid and soft tissue density adjacent to the drain without organized collection or abscess. 2. Irregular wall thickening of the cecum. If not performed recently, recommend direct visualization with colonoscopy to exclude colonic neoplasm. 3. Consolidation throughout the included left lower lobe with small pleural effusion. Trace right pleural effusion. 4. Anasarca with generalized body wall edema, confluent in the left abdomen and flank. 5. Enteric contrast in the distal esophagus, can be seen with reflux or slow transit. Aortic Atherosclerosis (ICD10-I70.0). Electronically Signed   By: Narda Rutherford M.D.   On: 09/23/2020 17:20   DG Chest 1 View  Result Date: 09/22/2020 CLINICAL DATA:  Status post left thoracentesis. EXAM: CHEST  1 VIEW COMPARISON:  September 21, 2020. FINDINGS: Stable cardiomegaly. Left pleural effusion is significantly smaller status post thoracentesis. No pneumothorax is noted. IMPRESSION: No pneumothorax status post left thoracentesis. Electronically Signed   By: Lupita Raider M.D.   On: 09/22/2020 17:02   CT HEAD WO CONTRAST ( )  Result Date: 09/23/2020 CLINICAL DATA:  Altered mental status. EXAM: CT HEAD WITHOUT CONTRAST TECHNIQUE: Contiguous axial images were obtained from the base of the skull through the vertex without intravenous contrast. COMPARISON:  July 30, 2019 FINDINGS: Brain: No evidence of acute hemorrhage, hydrocephalus, extra-axial collection or mass lesion/mass effect. A large area of encephalomalacia is again seen within the left temporal and left parietal regions. Associated ex vacuo dilatation of the posterior aspect of the left lateral ventricle is noted. Vascular: No hyperdense vessel or unexpected calcification. Skull: A left frontal parietal craniotomy defect is seen. Sinuses/Orbits: No acute finding. Other: None. IMPRESSION: 1. No acute intracranial abnormality. 2. Evidence of prior left frontoparietal craniotomy  with stable left temporal and left parietal lobe encephalomalacia. Electronically Signed   By: Aram Candela M.D.   On: 09/23/2020 22:32   DG CHEST PORT 1 VIEW  Result Date: 09/23/2020 CLINICAL DATA:  Shortness of breath. EXAM: PORTABLE CHEST 1 VIEW COMPARISON:  Radiograph yesterday. Lung bases from abdominopelvic CT earlier today. FINDINGS: Left basilar consolidation. Slight increased opacity in the left mid lung zone from radiograph yesterday, suggesting layering effusion. Cardiomegaly is stable. Trace right pleural effusion on CT is not well seen. No pneumothorax. No pulmonary edema. IMPRESSION: Left basilar consolidation. Slight increased opacity in the left mid lung zone from radiograph yesterday, suggesting layering effusion. Electronically Signed   By: Narda Rutherford M.D.   On: 09/23/2020 20:02   IR THORACENTESIS ASP PLEURAL SPACE W/IMG GUIDE  Result Date: 09/22/2020 INDICATION: Pleural Effusion EXAM: ULTRASOUND GUIDED DIAGNOSTIC AND THERAPEUTIC THORACENTESIS MEDICATIONS: None. COMPLICATIONS: None immediate. PROCEDURE: An ultrasound guided thoracentesis was thoroughly discussed with the patient and questions answered. The benefits, risks, alternatives and complications were also discussed. The patient understands and wishes to proceed with the procedure. Written consent was obtained. Ultrasound was performed to localize and mark an adequate pocket of fluid in the left chest. The area was then prepped and draped in the normal sterile fashion. 1% Lidocaine was used for local anesthesia. Under ultrasound guidance a 8 Fr Safe-T-Centesis catheter was introduced. Thoracentesis was performed. The catheter was removed and  a dressing applied. FINDINGS: A total of approximately 500cc of dark yellow fluid was removed. Samples were sent to the laboratory as requested by the clinical team. IMPRESSION: Successful ultrasound guided diagnostic and therapeutic thoracentesis yielding 500cc of pleural fluid.  Electronically Signed   By: Marliss Coots M.D.   On: 09/22/2020 16:37     Total time spent in minutes: 32 Hughie Closs, MD  [facility] 09/24/2020  If 7PM-7AM, please contact night-coverage

## 2020-09-24 NOTE — Plan of Care (Signed)
Patient progressing 

## 2020-09-24 NOTE — Therapy (Signed)
OT Cancellation Note  Patient Details Name: [name] MRN: [id number] DOB: [birth year]   Cancelled Treatment:    Reason Eval/Treat Not Completed: Other (comment) (Pt declined OT treatment this date stating that she would "try maybe later today, or tomorrow, not now." despite moderate verbal encouragement to participate and explaination of role of OT in rehab. Pt states that she understands the importance of therapy but "these doctors didn't have the surgery I had". Will check back as schedule/availability allows.)  Alm Bustard, OTR/L 09/24/2020, 10:21 AM

## 2020-09-24 NOTE — Consult Note (Signed)
[facility]    Date of Admission:  09/12/2020     Reason for Consult: sepsis    Referring Provider: Hughie Closs     Lines:  Peripheral iv Surgical site jp drain  Abx: 9/20-c vanc/cefepime/flagyl  9/18-20 piptazo        Assessment: Postop fever Pleural effusion Left lower lung opacity Itp s/p splenectomy Prior rituxan 4 monthly doses starting 05/2019  Aki on ckd3 Dm2 Obesity  55 y.o. female his chronic refractory ITP, ckd3, dm2, admitted for splenectomy, but postop course complicated by sepsis with left pleural effusion, and ID asked to co-manage  Surgical site looks great. Postop fever this early ddx is limited -- blood stream infection vs bladder/lung. Surgical site infection and surgical bed abscess is a later phenomenom unless clostridium perfringen or group a strep which doesn't appear to be the case  Mrse bsi potentially real  She does have imaging suggestive of pneumonia/parapneumonic effusion vs irritation from the splenectomy. Also has mild hypoxic respiratory failure. Empiric treatment for pneumonia is reasonable; this should cover mrse bsi if it is real (asking labs to speciate the earlier positive cx to compare susceptibility profile)  Her fever curve appear to be improving with empiric abx  Plan: Finish 7 days vanc/cefepime until 9/26; ok to stop metronidazole I do not feel strongly about need to repeat thoracentesis Can repeat bcx to document clearance of mrse (as that can cause endocarditis of native valve but rare) If persistent fever or persistent mrse bacteremia or worsening clinical status please reengage ID Will f/u on bcx result Disucssed with primary team  I spent 60 minute reviewing data/chart, and coordinating care and >50% direct face to face time providing counseling/discussing diagnostics/treatment plan with patient      ------------------------------------------------ Principal Problem:    Symptomatic anemia Active Problems:   Type II diabetes mellitus (HCC)   CKD (chronic kidney disease) stage 3, GFR 30-59 ml/min (HCC)   Idiopathic thrombocytopenic purpura (ITP) (HCC)   Acute on chronic diastolic CHF (congestive heart failure) (HCC)   Pancytopenia (HCC)    HPI: [name] is a 55 y.o. female his chronic refractory ITP, ckd3, dm2, admitted for splenectomy, but postop course complicated by sepsis with left pleural effusion, and ID asked to comanage   From hematology's previous note: Relapsed Refractory ITP: Relapsing ITP: Patient response to steroids and IVIG but because she is a diabetic we cannot use steroids. Normally responds to IV IG Prior IVIG: March 2021, April 2021, June 2021, Feb 2022 Prior treatment: Rituxan weekly x4 started 05/23/2019 (patient missed her treatment on 05/30/2019)  (patient missed multiple appointments)   Hospitalization: 06/25/2019-06/29/2019: Nausea and vomiting Hospitalization 08/02/2019-08/09/2019 cellulitis left foot Hospitalization 03/08/2020-03/11/2020: Relapsed ITP and renal failure: Improved with Nplate and IVIG   08/08/2019: Platelets 301, hemoglobin 8.6 09/05/2019: Platelets 110, hemoglobin 11.5 02/28/2020: Platelets 14, hemoglobin 10.1, creatinine 1.63, potassium 6.3 (this is after 2 doses of IVIG) 03/17/2020: Platelets 74, hemoglobin 9 04/09/2020: Platelets less than 5 (increasing the dosage of Nplate ) 04/17/2020: Platelets 10 (Nplate dosing is being managed by pharmacy): IVIG May 2022 05/08/20: Platelets: 141 05/21/2020: Platelet count 14 06/06/2020:   Based on very short-term improvement in the platelet counts with IVIG, I recommended that we treat her with IVIG get the platelets up and then send her for splenectomy.   Patient was without sepsis until day 1 post-op (surgery on 9/15); lap splenectomy Bcx w/u has been negative Patient seems  to have fever through bsAbx piptazo-->vanc/cefepime/flagyl (started 9/18) although today it has  resolved and wbc trending down An IR diagnostic thoracentesis was also done on 9/19 and cx ngtd. Ldh high, 13k wbc mostly neutrophilic Her 9/18 bcx did grow persistent mrse. She doesn't have any cardiac device/prosthetic joints   An abd pelv ct was done on 9/20 that shows left pleural effusion/basilar opacity   She required oxygen around that time and it has been improving to 6 liters via [place] today 9/21  She felt well now and reallyl doesn't recally feeling anything abnormal around onset of sepsis (denies cough, chest pain, sob, rash, n/v/diarrhea, joint pain, headache, back pain, dysuria)    Family History  Problem Relation Age of Onset   Diabetes Mother    Hypertension Mother    Hyperlipidemia Mother    Diabetes Father    Cancer Father        prostate   Hyperlipidemia Father    Hypertension Father    Diabetes Brother    Peripheral Artery Disease Brother        has had toes amputated   Diabetes Son     Social History   Tobacco Use   Smoking status: Never   Smokeless tobacco: Never  Vaping Use   Vaping Use: Never used  Substance Use Topics   Alcohol use: Yes    Alcohol/week: 2.0 standard drinks    Types: 2 Glasses of wine per week   Drug use: No    No Known Allergies  Review of Systems: ROS All Other ROS was negative, except mentioned above   Past Medical History:  Diagnosis Date   Acute exacerbation of CHF (congestive heart failure) (HCC) 04/11/2019   Anemia    Arthritis    "spine, hands" (11/25/2015)   Back pain    "all my back; probably 3 times/week" (11/25/2015)   Chronic indwelling Foley catheter 03/04/2017   Diabetic foot ulcer associated with type 2 diabetes mellitus (HCC)    Hypertension    Intracerebral hemorrhage (HCC) As a teenager    States she had burst blood vessel as teenager, now with resultant minor visual field and hearing deficits   Neuropathy    Stroke (HCC) ~ 1982   "my feeling on my RLE, right lower eye vision,  & hearing out of my  right ear not the same since" (11/25/2015)   Type II diabetes mellitus (HCC)        Scheduled Meds:  Chlorhexidine Gluconate Cloth  6 each Topical Q0600   [START ON 09/25/2020] enoxaparin (LOVENOX) injection  40 mg Subcutaneous Q24H   folic acid  2 mg Oral Daily   furosemide  40 mg Oral BID   gabapentin  400 mg Oral BID   insulin aspart  0-15 Units Subcutaneous Q4H   metoprolol succinate  25 mg Oral Daily   pantoprazole  40 mg Oral Daily   rosuvastatin  20 mg Oral Daily   Continuous Infusions:  ceFEPime (MAXIPIME) IV 2 g (09/24/20 0153)   [START ON 09/25/2020] vancomycin     PRN Meds:.acetaminophen, HYDROmorphone (DILAUDID) injection, loperamide, ondansetron (ZOFRAN) IV, oxyCODONE, polyvinyl alcohol   OBJECTIVE: Blood pressure (!) 134/50, pulse 79, temperature 99.1 F (37.3 C), temperature source Oral, resp. rate 20, height 5\' 7"  (1.702 m), weight 112.1 kg, SpO2 99 %.  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; trace bilateral LE edema Skin: No Rash Neuro: nonfocal MSK: no peripheral joint swelling/tenderness/warmth; back spines nontender Psych: alert/oriented  Central line presence: no   Lab Results Lab Results  Component Value Date   WBC 17.0 (H) 09/24/2020   HGB 9.2 (L) 09/24/2020   HCT 31.9 (L) 09/24/2020   MCV 87.4 09/24/2020   PLT 757 (H) 09/24/2020    Lab Results  Component Value Date   CREATININE 2.06 (H) 09/24/2020   BUN 41 (H) 09/24/2020   NA 135 09/24/2020   K 3.9 09/24/2020   CL 101 09/24/2020   CO2 22 09/24/2020    Lab Results  Component Value Date   ALT 16 09/24/2020   AST 24 09/24/2020   ALKPHOS 83  09/24/2020   BILITOT 0.6 09/24/2020      Microbiology: Recent Results (from the past 240 hour(s))  Culture, blood (routine x 2)     Status: Abnormal (Preliminary result)   Collection Time: 09/21/20 10:57 AM   Specimen: BLOOD  Result Value Ref Range Status   Specimen Description BLOOD SITE NOT SPECIFIED  Final   Special Requests   Final    BOTTLES DRAWN AEROBIC AND ANAEROBIC Blood Culture adequate volume   Culture  Setup Time   Final    AEROBIC BOTTLE ONLY GRAM POSITIVE COCCI IN CLUSTERS CRITICAL VALUE NOTED.  VALUE IS CONSISTENT WITH PREVIOUSLY REPORTED AND CALLED VALUE.    Culture (A)  Final    STAPHYLOCOCCUS EPIDERMIDIS CULTURE REINCUBATED FOR BETTER GROWTH Performed at [facility], [address], [place], [place] [address]    Report Status PENDING  Incomplete  Culture, blood (routine x 2)     Status: Abnormal   Collection Time: 09/21/20 11:20 AM   Specimen: BLOOD RIGHT HAND  Result Value Ref Range Status   Specimen Description BLOOD RIGHT HAND  Final   Special Requests   Final    BOTTLES DRAWN AEROBIC ONLY Blood Culture adequate volume   Culture  Setup Time   Final    GRAM POSITIVE COCCI IN CLUSTERS AEROBIC BOTTLE ONLY CRITICAL RESULT CALLED TO, READ BACK BY AND VERIFIED WITHCeledonio Miyamoto [facility] 7858 09/22/20 A BROWNING Performed at [facility], [address], [place], [place] [address]    Culture STAPHYLOCOCCUS EPIDERMIDIS (A)  Final   Report Status 09/24/2020 FINAL  Final   Organism ID, Bacteria STAPHYLOCOCCUS EPIDERMIDIS  Final      Susceptibility   Staphylococcus epidermidis - MIC*    CIPROFLOXACIN <=0.5 SENSITIVE Sensitive     ERYTHROMYCIN >=8 RESISTANT Resistant     GENTAMICIN <=0.5 SENSITIVE Sensitive     OXACILLIN >=4 RESISTANT Resistant     TETRACYCLINE >=16 RESISTANT Resistant     VANCOMYCIN 2 SENSITIVE Sensitive     TRIMETH/SULFA <=10 SENSITIVE Sensitive     CLINDAMYCIN <=0.25 SENSITIVE Sensitive     RIFAMPIN <=0.5 SENSITIVE Sensitive      Inducible Clindamycin NEGATIVE Sensitive     * STAPHYLOCOCCUS EPIDERMIDIS  Blood Culture ID Panel (Reflexed)     Status: Abnormal   Collection Time: 09/21/20 11:20 AM  Result Value Ref Range Status   Enterococcus faecalis NOT DETECTED NOT DETECTED Final   Enterococcus Faecium NOT DETECTED NOT DETECTED Final   Listeria monocytogenes NOT DETECTED NOT DETECTED Final   Staphylococcus species DETECTED (A) NOT DETECTED Final  Comment: CRITICAL RESULT CALLED TO, READ BACK BY AND VERIFIED WITH: Celedonio Miyamoto [facility] 1627 09/22/20 A BROWNING    Staphylococcus aureus (BCID) NOT DETECTED NOT DETECTED Final   Staphylococcus epidermidis DETECTED (A) NOT DETECTED Final    Comment: Methicillin (oxacillin) resistant coagulase negative staphylococcus. Possible blood culture contaminant (unless isolated from more than one blood culture draw or clinical case suggests pathogenicity). No antibiotic treatment is indicated for blood  culture contaminants. CRITICAL RESULT CALLED TO, READ BACK BY AND VERIFIED WITH: Celedonio Miyamoto PHAR[facility] 1627 09/22/20 A BROWNING    Staphylococcus lugdunensis NOT DETECTED NOT DETECTED Final   Streptococcus species NOT DETECTED NOT DETECTED Final   Streptococcus agalactiae NOT DETECTED NOT DETECTED Final   Streptococcus pneumoniae NOT DETECTED NOT DETECTED Final   Streptococcus pyogenes NOT DETECTED NOT DETECTED Final   A.calcoaceticus-baumannii NOT DETECTED NOT DETECTED Final   Bacteroides fragilis NOT DETECTED NOT DETECTED Final   Enterobacterales NOT DETECTED NOT DETECTED Final   Enterobacter cloacae complex NOT DETECTED NOT DETECTED Final   Escherichia coli NOT DETECTED NOT DETECTED Final   Klebsiella aerogenes NOT DETECTED NOT DETECTED Final   Klebsiella oxytoca NOT DETECTED NOT DETECTED Final   Klebsiella pneumoniae NOT DETECTED NOT DETECTED Final   Proteus species NOT DETECTED NOT DETECTED Final   Salmonella species NOT DETECTED NOT DETECTED Final   Serratia marcescens  NOT DETECTED NOT DETECTED Final   Haemophilus influenzae NOT DETECTED NOT DETECTED Final   Neisseria meningitidis NOT DETECTED NOT DETECTED Final   Pseudomonas aeruginosa NOT DETECTED NOT DETECTED Final   Stenotrophomonas maltophilia NOT DETECTED NOT DETECTED Final   Candida albicans NOT DETECTED NOT DETECTED Final   Candida auris NOT DETECTED NOT DETECTED Final   Candida glabrata NOT DETECTED NOT DETECTED Final   Candida krusei NOT DETECTED NOT DETECTED Final   Candida parapsilosis NOT DETECTED NOT DETECTED Final   Candida tropicalis NOT DETECTED NOT DETECTED Final   Cryptococcus neoformans/gattii NOT DETECTED NOT DETECTED Final   Methicillin resistance mecA/C DETECTED (A) NOT DETECTED Final    Comment: CRITICAL RESULT CALLED TO, READ BACK BY AND VERIFIED WITHCeledonio Miyamoto [facility] 8372 09/22/20 A BROWNING Performed at [facility], [address], [place], [place] [address]   Culture, body fluid w Gram Stain-bottle     Status: None (Preliminary result)   Collection Time: 09/22/20  4:51 PM   Specimen: Pleura  Result Value Ref Range Status   Specimen Description PLEURAL  Final   Special Requests NONE  Final   Culture   Final    NO GROWTH 2 DAYS Performed at [facility], [address], [place], [place] [address]    Report Status PENDING  Incomplete  Gram stain     Status: None   Collection Time: 09/22/20  4:51 PM   Specimen: Pleura  Result Value Ref Range Status   Specimen Description PLEURAL  Final   Special Requests NONE  Final   Gram Stain   Final    ABUNDANT WBC PRESENT, PREDOMINANTLY PMN NO ORGANISMS SEEN Performed at [facility], [address] [place], [place] [address]    Report Status 09/22/2020 FINAL  Final     Serology:    Imaging: If present, new imagings (plain films, ct scans, and mri) have been personally visualized and interpreted; radiology reports have been reviewed. Decision making incorporated into the Impression /  Recommendations.  9/20 abd pelv ct 1. Recent splenectomy. Surgical drain in the left upper quadrant with small  amount of ill-defined fluid and soft tissue density adjacent to the drain without organized collection or abscess. 2. Irregular wall thickening of the cecum. If not performed recently, recommend direct visualization with colonoscopy to exclude colonic neoplasm. 3. Consolidation throughout the included left lower lobe with small pleural effusion. Trace right pleural effusion. 4. Anasarca with generalized body wall edema, confluent in the left abdomen and flank. 5. Enteric contrast in the distal esophagus, can be seen with reflux or slow transit.  Raymondo Band, MD [facility] [phone] pager    09/24/2020, 12:37 PM

## 2020-09-24 NOTE — Consult Note (Addendum)
[facility] Consult: 11:34 AM 09/24/2020  LOS: 12 days    Referring Provider: Dr Doristine Bosworth  Primary Care Physician:  Cheryl Cuff, MD Primary Gastroenterologist:  unassigned, scoped by Dr Michail Sermon inpt in 2017.      Reason for Consultation:  cecal wall thickening   HPI: [name] is a 55 y.o. female.  Hx ITP.  Diastolic CHF.  IDDM.  Status post R transmetatarsal and then R BKA 2012, 2013.  09/2015 EGD for IDA.  Acute gastritis, otherwise normal study.  Pathology showed reactive gastropathy/chemical gastritis, no H. pylori.  GE junction biopsies with acute inflammation, no metaplasia, dysplasia.  Duodenal biopsies with benign SB mucosa, no villous atrophy or inflammation. 09/2015 colonoscopy.  Small, nonbleeding internal hemorrhoids.  Fair prep.  Incidental notation of decubitus ulcers.  Study completed to IC valve  Patient is Wolf day 12.  Admission with symptomatic anemia, pancytopenia.  Platelets less than 5, treated with steroids, Nplate.  Underwent laparoscopic splenectomy 9/15 So far received 4 PRBCs, last was on 9/19.   09/23/20 CTAP sans contrast, for eval fever, abdominal pain.  Changes of recent splenectomy with LUQ drain.  Small volume, poorly defined fluid and soft tissue density adjacent to drain but no organized collection or abscess.  Cecal wall thickened.  LLL consolidation, small pleural effusion and trace right pleural effusion.  Anasarca, generalized body wall edema can fluid in left abdomen and flank.  Contrast at distal esophagus, ? reflux, slow transit?  Hgb 9.2.  MCV 87 WBC 17 K.  Platelets 757K.  INR 1.3. Low iron 12, low iron sats 3%.  Ferritin 27.  Low folate 5.5 BUN/creatinine 41/2.0.  GFR 28.  Other than albumin of 2.2, LFTs normal. BNP 1080.    09/22/2020 underwent  diagnostic, 500 mL L thoracentesis. Yesterday evening patient noted lethargic, hypoxic, fever to 102.1.  More alert after application nonrebreather mask.  CXR confirmed left pleural effusion.  Also having acute on chronic diastolic CHF and had run out of Lasix PTA.  Lasix held in setting of AKI but now restarted. For her deconditioning, debility, PT recommends SNF but patient wants to go home.  During discussion today pt endorses nonsevere pain across upper, mid abdomen she is eating the bulk of her solid food and has been tolerating this for 2 to 3 days.  Normal bowel habits are brown, formed stools every day or 2.  No melena, no blood PR.  2 days ago patient had a single episode of loose stool, thought she was just passing gas but ended up passing liquid brown stool. Does not suffer from reflux disease, dysphagia, anorexia.  Lives in [name].  Son staying w pt.  No ETOH, tobacco.    Past Medical History:  Diagnosis Date   Acute exacerbation of CHF (congestive heart failure) ([name]) 04/11/2019   Anemia    Arthritis    "spine, hands" (11/25/2015)   Back pain    "all my back; probably 3 times/week" (11/25/2015)   Chronic indwelling Foley catheter 03/04/2017   Diabetic foot ulcer associated with type 2  diabetes mellitus ([place])    Hypertension    Intracerebral hemorrhage ([name]) As a teenager    States she had burst blood vessel as teenager, now with resultant minor visual field and hearing deficits   Neuropathy    Stroke [name]) ~ 1982   "my feeling on my RLE, right lower eye vision,  & hearing out of my right ear not the same since" (11/25/2015)   Type II diabetes mellitus ([name])     Past Surgical History:  Procedure Laterality Date   AMPUTATION  11/24/2010   Procedure: AMPUTATION FOOT;  Surgeon: Cheryl Simmer, MD;  Location: [facility];  Service: Orthopedics;  Laterality: Right;  Right  FOOT TRANS METATARSAL AMPUTATION   AMPUTATION  07/02/2011   Procedure: AMPUTATION BELOW KNEE;  Surgeon: Cheryl Simmer, MD;   Location: [facility];  Service: Orthopedics;  Laterality: Right;   APPLICATION OF WOUND VAC  07/02/2011   Procedure: APPLICATION OF WOUND VAC;  Surgeon: Cheryl Simmer, MD;  Location: [facility];  Service: Orthopedics;  Laterality: Right;   BRAIN SURGERY  1980's   Previous brain surgery in [name]  2004; 2006   COLONOSCOPY N/A 09/17/2015   Procedure: COLONOSCOPY;  Surgeon: Cheryl Corner, MD;  Location: [name];  Service: Endoscopy;  Laterality: N/A;   ESOPHAGOGASTRODUODENOSCOPY N/A 09/17/2015   Procedure: ESOPHAGOGASTRODUODENOSCOPY (EGD);  Surgeon: Cheryl Corner, MD;  Location: [name];  Service: Endoscopy;  Laterality: N/A;   I & D EXTREMITY  11/24/2010   Procedure: IRRIGATION AND DEBRIDEMENT EXTREMITY;  Surgeon: Cheryl Simmer, MD;  Location: [name];  Service: Orthopedics;  Laterality: Left;  Debriedment of left plantar ulcer and trimming of toenails   I & D EXTREMITY  07/02/2011   Procedure: IRRIGATION AND DEBRIDEMENT EXTREMITY;  Surgeon: Cheryl Simmer, MD;  Location: [name];  Service: Orthopedics;  Laterality: Left;  I & D of Left Foot   I & D EXTREMITY  07/06/2011   Procedure: IRRIGATION AND DEBRIDEMENT EXTREMITY;  Surgeon: Cheryl Simmer, MD;  Location: [name];  Service: Orthopedics;  Laterality: Right;  IRRIGATION/DEBRIDEMENT RIGHT BELOW KNEE AMPUTATION   IR THORACENTESIS ASP PLEURAL SPACE W/IMG GUIDE  09/22/2020   LAPAROSCOPIC SPLENECTOMY N/A 09/18/2020   Procedure: HAND ASSISTED LAPAROSCOPIC SPLENECTOMY;  Surgeon: Stark Klein, MD;  Location: [name];  Service: General;  Laterality: N/A;  [place]  2006    Prior to Admission medications   Medication Sig Start Date End Date Taking? Authorizing Provider  amLODipine (NORVASC) 5 MG tablet Take 1 tablet (5 mg total) by mouth daily. 01/28/20 01/27/21 Yes Vevelyn Francois, NP  aspirin EC 81 MG tablet Take 81 mg by mouth daily as needed for moderate pain. Swallow whole.   Yes [provider]  furosemide  (LASIX) 40 MG tablet Take 160 mg by mouth daily. 04/02/20  Yes [provider]  gabapentin (NEURONTIN) 400 MG capsule Take 1 capsule (400 mg total) by mouth 2 (two) times daily. 01/28/20 01/27/21 Yes Vevelyn Francois, NP  isosorbide mononitrate (IMDUR) 30 MG 24 hr tablet Take 1 tablet (30 mg total) by mouth daily. 01/28/20 01/22/21 Yes Vevelyn Francois, NP  metFORMIN (GLUCOPHAGE) 500 MG tablet Take 500 mg by mouth 2 (two) times daily. 06/16/20  Yes [provider]  metoprolol succinate (TOPROL-XL) 50 MG 24 hr tablet Take 1 tablet (50 mg total) by mouth daily. Take with or immediately following a meal. 01/28/20 01/22/21 Yes King, Diona Foley, NP  rosuvastatin (CRESTOR) 20 MG  tablet Take 1 tablet (20 mg total) by mouth daily. 01/28/20 01/22/21 Yes Vevelyn Francois, NP  Blood Glucose Monitoring Suppl (PRODIGY AUTOCODE BLOOD GLUCOSE) w/Device KIT 1 kit by Does not apply route in the morning, at noon, in the evening, and at bedtime. 06/06/19 06/05/20  Vevelyn Francois, NP  ferrous gluconate (FERGON) 324 MG tablet Take 1 tablet (324 mg total) by mouth daily with breakfast. Patient not taking: No sig reported 06/16/20   Shawna Clamp, MD  polyvinyl alcohol (LIQUIFILM TEARS) 1.4 % ophthalmic solution Place 1 drop into both eyes as needed for dry eyes. Patient not taking: No sig reported    [provider]    Scheduled Meds:  Chlorhexidine Gluconate Cloth  6 each Topical Q0600   [START ON 09/25/2020] enoxaparin (LOVENOX) injection  40 mg Subcutaneous M84X   folic acid  2 mg Oral Daily   furosemide  40 mg Oral BID   gabapentin  400 mg Oral BID   insulin aspart  0-15 Units Subcutaneous Q4H   metoprolol succinate  25 mg Oral Daily   pantoprazole  40 mg Oral Daily   rosuvastatin  20 mg Oral Daily   Infusions:  ceFEPime (MAXIPIME) IV 2 g (09/24/20 0153)   [START ON 09/25/2020] vancomycin     PRN Meds: acetaminophen, HYDROmorphone (DILAUDID) injection, loperamide, ondansetron (ZOFRAN) IV,  oxyCODONE, polyvinyl alcohol   Allergies as of 09/12/2020   (No Known Allergies)    Family History  Problem Relation Age of Onset   Diabetes Mother    Hypertension Mother    Hyperlipidemia Mother    Diabetes Father    Cancer Father        prostate   Hyperlipidemia Father    Hypertension Father    Diabetes Brother    Peripheral Artery Disease Brother        has had toes amputated   Diabetes Son     Social History   Socioeconomic History   Marital status: Widowed        Number of children: 2   Years of education: Not on file   Highest education level: Some college, no degree  Occupational History   Occupation: disability  Tobacco Use   Smoking status: Never   Smokeless tobacco: Never  Vaping Use   Vaping Use: Never used  Substance and Sexual Activity   Alcohol use: Yes    Alcohol/week: 2.0 standard drinks    Types: 2 Glasses of wine per week   Drug use: No   Sexual activity: Not Currently    Birth control/protection: Post-menopausal  Other Topics Concern   Not on file  Social History Narrative   Widowed in 2012   Lives in a home she owns   No wheelchair ramp, though garage entry is flush to both garage and entry floor.   Unable to use full bathroom upstairs.   Has prosthesis for right LE, but never learned how to use.   Cannot see well due to cataracts.   Did not receive disability in 03/2438 with application.   Possibly due to husband's SS she receives   Social Determinants of Radio broadcast assistant Strain: Low Risk    Difficulty of Paying Living Expenses: Not very hard  Food Insecurity: No Food Insecurity   Worried About Charity fundraiser in the Last Year: Never true   Arboriculturist in the Last Year: Never true  Transportation Needs: Public librarian (Medical):  No   Lack of Transportation (Non-Medical): Yes            REVIEW OF SYSTEMS: Constitutional: Some fatigue, not profound.  At home she is in a  wheelchair but she is able to do all her own transfers and ADLs as well as prepare meals. ENT:  No nose bleeds Pulm: Breathing better following thoracentesis CV:  No palpitations, no LE edema.  GU:  No hematuria, no frequency GI: See HPI. Heme: Denies issues with excessive bleeding or bruising. Transfusions: Recalls transfusion with blood products at the time of her AKA some years ago. Neuro:  No headaches, no peripheral tingling or numbness.  No seizures, no syncope. Derm:  No itching, no rash or sores.  Endocrine:  No sweats or chills.  No polyuria or dysuria Immunization: Reviewed vaccine history and she is received [employer] vaccine as well as multiple other vaccines. Travel:  None beyond local counties in last few months.    PHYSICAL EXAM: Vital signs in last 24 hours: Vitals:   09/24/20 0727 09/24/20 1109  BP: 131/60 (!) 134/50  Pulse: 76 79  Resp: 15 20  Temp: 98.8 F (37.1 C) 99.1 F (37.3 C)  SpO2: 97% 99%   Wt Readings from Last 3 Encounters:  09/24/20 112.1 kg  06/15/20 105.5 kg  05/05/20 101.6 kg    General: Obese, very pleasant, looks moderately ill/unwell.  Sitting up in bed alert and comfortable. Head: No facial asymmetry or swelling.  No signs of head trauma. Eyes: No conjunctival pallor.  EOMI Ears: Hearing intact without deficit Nose: No congestion or discharge Mouth: Fair dentition, a few sites of caries.  Mucosa is moist, pink, clear.  Torus palatinus. Neck: No masses, no thyromegaly. Lungs: Diminished breath sounds on the left but clear.  No labored breathing or cough. Heart: RRR.  No MRG.  S1, S2 present. Abdomen: Soft, obese.  Surgical incisions intact.  No tenderness to moderate pressure palpation.  No organomegaly, masses, bruits, hernias. Musc/Skeltl: No gross joint deformities, swelling or redness. Extremities: Left BKA.  1+ edema and swelling in lower legs, foot on right. Neurologic: Oriented x3.  Able to move all 4 limbs, strength not  tested but no gross deficits and no tremor. Skin: No rash, no sores, no suspicious lesions Nodes: No cervical adenopathy Psych: Very pleasant, calm, cooperative.  Fluid speech.    LAB RESULTS: Recent Labs    09/23/20 0939 09/23/20 2008 09/24/20 0342  WBC 19.0* 17.9* 17.0*  HGB 8.6* 8.4* 9.2*  HCT 29.0* 28.7* 31.9*  PLT 637* 723* 757*   BMET Lab Results  Component Value Date   NA 135 09/24/2020   NA 135 09/23/2020   NA 134 (L) 09/23/2020   K 3.9 09/24/2020   K 3.9 09/23/2020   K 4.4 09/23/2020   CL 101 09/24/2020   CL 102 09/23/2020   CL 102 09/23/2020   CO2 22 09/24/2020   CO2 22 09/23/2020   CO2 21 (L) 09/23/2020   GLUCOSE 122 (H) 09/24/2020   GLUCOSE 168 (H) 09/23/2020   GLUCOSE 124 (H) 09/23/2020   BUN 41 (H) 09/24/2020   BUN 44 (H) 09/23/2020   BUN 45 (H) 09/23/2020   CREATININE 2.06 (H) 09/24/2020   CREATININE 2.14 (H) 09/23/2020   CREATININE 1.99 (H) 09/23/2020   CALCIUM 7.5 (L) 09/24/2020   CALCIUM 7.1 (L) 09/23/2020   CALCIUM 7.2 (L) 09/23/2020   LFT Recent Labs    09/23/20 0939 09/23/20 2008 09/24/20 0342  PROT 5.9*  6.2* 6.4*  ALBUMIN 2.0* 2.0* 2.2*  AST 33 28 24  ALT '15 16 16  ' ALKPHOS 69 75 83  BILITOT 0.6 0.5 0.6   PT/INR Lab Results  Component Value Date   INR 1.3 (H) 09/24/2020   INR 1.3 (H) 09/13/2020   INR 1.3 (H) 07/30/2019   Hepatitis Panel No results for input(s): HEPBSAG, HCVAB, HEPAIGM, HEPBIGM in the last 72 hours. C-Diff No components found for: CDIFF Lipase     Component Value Date/Time   LIPASE 23 06/26/2019 0124      RADIOLOGY STUDIES: images viewed Gatha Mayer, MD, [facility]  CT ABDOMEN PELVIS WO CONTRAST  Result Date: 09/23/2020 CLINICAL DATA:  Abdominal pain. Fever. Postop. Recent laparoscopic splenectomy. EXAM: CT ABDOMEN AND PELVIS WITHOUT CONTRAST TECHNIQUE: Multidetector CT imaging of the abdomen and pelvis was performed following the standard protocol without IV contrast. COMPARISON:  Renal ultrasound  06/12/2020. Abdominopelvic CT 06/26/2019 FINDINGS: Lower chest: Consolidation throughout the included left lower lobe with small pleural effusion. There is enteric contrast in the distal esophagus. Trace right pleural effusion. Hepatobiliary: Prominent size liver spans 19.3 cm cranial caudal. Mild subjective hepatic steatosis. Focal hepatic abnormality on this unenhanced exam. The gallbladder is decompressed. No visualized calcified gallstone. No biliary dilatation Pancreas: Atrophic.  No ductal dilatation or inflammation. Spleen: Splenectomy. Surgical drain in the left upper quadrant. Small amount of ill-defined fluid and soft tissue density adjacent to the drain without organized collection. Adrenals/Urinary Tract: No adrenal nodule. No hydronephrosis. Mild bilateral perinephric edema. No visualized renal stone or focal lesion. Calcifications at the renal hila are felt to be vascular. Urinary bladder is decompressed by Foley catheter. Stomach/Bowel: Enteric contrast is seen in the distal esophagus. The stomach is physiologically distended. There is no gastric wall thickening. No obstruction, enteric contrast is seen in the colon. Majority of the small bowel is decompressed. No small bowel inflammation. Normal appendix. Irregular wall thickening of the cecum, series 3, image 73, series 6, image 48. This measures up to 13 mm. No colonic inflammation. Vascular/Lymphatic: Advanced aortic and branch atherosclerosis. No aortic aneurysm. No portal venous or mesenteric gas. No enlarged lymph nodes in the abdomen or pelvis. Reproductive: Unremarkable appearance of the uterus. The ovaries are not well-defined, quiescent appearance of the right ovary. The left ovary is not well seen. No suspicious adnexal mass. Other: Generalized body wall edema, confluent in the left flank. No organized subcutaneous collection. No free air. Minimal ill-defined fluid and soft tissue density in the left upper quadrant with organized abscess  or collection. Musculoskeletal: There are no acute or suspicious osseous abnormalities. IMPRESSION: 1. Recent splenectomy. Surgical drain in the left upper quadrant with small amount of ill-defined fluid and soft tissue density adjacent to the drain without organized collection or abscess. 2. Irregular wall thickening of the cecum. If not performed recently, recommend direct visualization with colonoscopy to exclude colonic neoplasm. 3. Consolidation throughout the included left lower lobe with small pleural effusion. Trace right pleural effusion. 4. Anasarca with generalized body wall edema, confluent in the left abdomen and flank. 5. Enteric contrast in the distal esophagus, can be seen with reflux or slow transit. Aortic Atherosclerosis (ICD10-I70.0). Electronically Signed   By: Keith Rake M.D.   On: 09/23/2020 17:20   DG Chest 1 View  Result Date: 09/22/2020 CLINICAL DATA:  Status post left thoracentesis. EXAM: CHEST  1 VIEW COMPARISON:  September 21, 2020. FINDINGS: Stable cardiomegaly. Left pleural effusion is significantly smaller status post thoracentesis. No pneumothorax is noted.  IMPRESSION: No pneumothorax status post left thoracentesis. Electronically Signed   By: Marijo Conception M.D.   On: 09/22/2020 17:02   CT HEAD WO CONTRAST (5MM)  Result Date: 09/23/2020 CLINICAL DATA:  Altered mental status. EXAM: CT HEAD WITHOUT CONTRAST TECHNIQUE: Contiguous axial images were obtained from the base of the skull through the vertex without intravenous contrast. COMPARISON:  July 30, 2019 FINDINGS: Brain: No evidence of acute hemorrhage, hydrocephalus, extra-axial collection or mass lesion/mass effect. A large area of encephalomalacia is again seen within the left temporal and left parietal regions. Associated ex vacuo dilatation of the posterior aspect of the left lateral ventricle is noted. Vascular: No hyperdense vessel or unexpected calcification. Skull: A left frontal parietal craniotomy defect is  seen. Sinuses/Orbits: No acute finding. Other: None. IMPRESSION: 1. No acute intracranial abnormality. 2. Evidence of prior left frontoparietal craniotomy with stable left temporal and left parietal lobe encephalomalacia. Electronically Signed   By: Virgina Norfolk M.D.   On: 09/23/2020 22:32   DG CHEST PORT 1 VIEW  Result Date: 09/23/2020 CLINICAL DATA:  Shortness of breath. EXAM: PORTABLE CHEST 1 VIEW COMPARISON:  Radiograph yesterday. Lung bases from abdominopelvic CT earlier today. FINDINGS: Left basilar consolidation. Slight increased opacity in the left mid lung zone from radiograph yesterday, suggesting layering effusion. Cardiomegaly is stable. Trace right pleural effusion on CT is not well seen. No pneumothorax. No pulmonary edema. IMPRESSION: Left basilar consolidation. Slight increased opacity in the left mid lung zone from radiograph yesterday, suggesting layering effusion. Electronically Signed   By: Keith Rake M.D.   On: 09/23/2020 20:02   IR THORACENTESIS ASP PLEURAL SPACE W/IMG GUIDE  Result Date: 09/22/2020 INDICATION: Pleural Effusion EXAM: ULTRASOUND GUIDED DIAGNOSTIC AND THERAPEUTIC THORACENTESIS MEDICATIONS: None. COMPLICATIONS: None immediate. PROCEDURE: An ultrasound guided thoracentesis was thoroughly discussed with the patient and questions answered. The benefits, risks, alternatives and complications were also discussed. The patient understands and wishes to proceed with the procedure. Written consent was obtained. Ultrasound was performed to localize and mark an adequate pocket of fluid in the left chest. The area was then prepped and draped in the normal sterile fashion. 1% Lidocaine was used for local anesthesia. Under ultrasound guidance a 8 Fr Safe-T-Centesis catheter was introduced. Thoracentesis was performed. The catheter was removed and a dressing applied. FINDINGS: A total of approximately 500cc of dark yellow fluid was removed. Samples were sent to the laboratory  as requested by the clinical team. IMPRESSION: Successful ultrasound guided diagnostic and therapeutic thoracentesis yielding 500cc of pleural fluid. Electronically Signed   By: Ruthann Cancer M.D.   On: 09/22/2020 16:37     CHEST CT TODAY IMPRESSION: 1. Complex left pleural effusion which is partially loculated, as above, with associated areas of atelectasis in the lungs, and probable airspace consolidation the basal segments of the left lower lobe concerning for pneumonia. 2. Mild cardiomegaly. 3. Asymmetric masslike enlargement of the left lobe of the thyroid gland which is increased compared to the prior study. Recommend thyroid US (ref: J Am Coll Radiol. 2015 Feb;12(2): 143-50). 4. Additional incidental findings, as above. 5. Aortic atherosclerosis.   Aortic Atherosclerosis (ICD10-I70.0).     Electronically Signed   By: Vinnie Langton M.D.   On: 09/24/2020 14:23  IMPRESSION:     Cecal wall thickening per CT, single episode diarrhea 3 days ago and no bowel movement since so low suspicion for infectious colitis.  Postoperative abdominal pain is improving.     ITP.  9/15 lap splenectomy.  Pancytopenia.  S/p 4 PRBCs    Anemia.  Hx IDDA.  PTA patient had not been taking previously Rx'd ferrous gluconate  Gastritis 2017.  No PPI, H2 blocker at home.  Currently receiving Protonix 40 mg po daily.    Bacteremia.  Growing staph epidermidis from blood culture 3 days ago.     Pleural effusion.  S/p L thoracentesis.    PLAN:        Pursue colonoscopy? Vs observation.   Azucena Freed  09/24/2020, 11:34 AM Phone Gonzales Attending   I have taken a history, reviewed the chart and examined the patient. I agree with the Advanced Practitioner's note, impression and recommendations.  Majority the medical decision-making in the formulation of the assessment and plan were performed by me.   The cecum looks abnormal - wall is thick. Cancer is definitely  possible. She is iron-deficient also.  She needs a colonoscopy but not until she is in better shape. She has a massive complicated pleural effusion that needs different management (as you are pursuing).  We will follow along and determine best time to proceed with elective colonoscopy  Gatha Mayer, MD, [facility] 09/24/2020 2:51 PM

## 2020-09-24 NOTE — Progress Notes (Signed)
Interventional Brief Note  IR asked to evaluate imaging for recurrent left pleural effusion.  Patient has CXRs, Abdominal CT, however no CT Chest. Reviewed case with Dr. Loreta Ave who recommends CT Chest (non-con ok given elevated SCr).  Ordering service aware of recommendation.   Loyce Dys, MS RD PA-C

## 2020-09-25 ENCOUNTER — Inpatient Hospital Stay (HOSPITAL_COMMUNITY): Payer: Medicare Other

## 2020-09-25 DIAGNOSIS — D649 Anemia, unspecified: Secondary | ICD-10-CM | POA: Diagnosis not present

## 2020-09-25 LAB — CBC WITH DIFFERENTIAL/PLATELET
Abs Immature Granulocytes: 0.06 10*3/uL (ref 0.00–0.07)
Basophils Absolute: 0.1 10*3/uL (ref 0.0–0.1)
Basophils Relative: 0 %
Eosinophils Absolute: 0.5 10*3/uL (ref 0.0–0.5)
Eosinophils Relative: 4 %
HCT: 29.7 % — ABNORMAL LOW (ref 36.0–46.0)
Hemoglobin: 9 g/dL — ABNORMAL LOW (ref 12.0–15.0)
Immature Granulocytes: 0 %
Lymphocytes Relative: 6 %
Lymphs Abs: 0.8 10*3/uL (ref 0.7–4.0)
MCH: 26 pg (ref 26.0–34.0)
MCHC: 30.3 g/dL (ref 30.0–36.0)
MCV: 85.8 fL (ref 80.0–100.0)
Monocytes Absolute: 3.2 10*3/uL — ABNORMAL HIGH (ref 0.1–1.0)
Monocytes Relative: 22 %
Neutro Abs: 9.6 10*3/uL — ABNORMAL HIGH (ref 1.7–7.7)
Neutrophils Relative %: 68 %
Platelets: 790 10*3/uL — ABNORMAL HIGH (ref 150–400)
RBC: 3.46 MIL/uL — ABNORMAL LOW (ref 3.87–5.11)
RDW: 24.2 % — ABNORMAL HIGH (ref 11.5–15.5)
WBC: 14.2 10*3/uL — ABNORMAL HIGH (ref 4.0–10.5)
nRBC: 0.1 % (ref 0.0–0.2)

## 2020-09-25 LAB — GLUCOSE, CAPILLARY
Glucose-Capillary: 103 mg/dL — ABNORMAL HIGH (ref 70–99)
Glucose-Capillary: 120 mg/dL — ABNORMAL HIGH (ref 70–99)
Glucose-Capillary: 132 mg/dL — ABNORMAL HIGH (ref 70–99)
Glucose-Capillary: 143 mg/dL — ABNORMAL HIGH (ref 70–99)
Glucose-Capillary: 85 mg/dL (ref 70–99)

## 2020-09-25 LAB — COMPREHENSIVE METABOLIC PANEL
ALT: 13 U/L (ref 0–44)
AST: 17 U/L (ref 15–41)
Albumin: 2.1 g/dL — ABNORMAL LOW (ref 3.5–5.0)
Alkaline Phosphatase: 74 U/L (ref 38–126)
Anion gap: 10 (ref 5–15)
BUN: 36 mg/dL — ABNORMAL HIGH (ref 6–20)
CO2: 24 mmol/L (ref 22–32)
Calcium: 7.9 mg/dL — ABNORMAL LOW (ref 8.9–10.3)
Chloride: 103 mmol/L (ref 98–111)
Creatinine, Ser: 1.71 mg/dL — ABNORMAL HIGH (ref 0.44–1.00)
GFR, Estimated: 35 mL/min — ABNORMAL LOW (ref >60)
Glucose, Bld: 80 mg/dL (ref 70–99)
Potassium: 3.6 mmol/L (ref 3.5–5.1)
Sodium: 137 mmol/L (ref 135–145)
Total Bilirubin: 0.7 mg/dL (ref 0.3–1.2)
Total Protein: 6 g/dL — ABNORMAL LOW (ref 6.5–8.1)

## 2020-09-25 MED ORDER — ENOXAPARIN SODIUM 60 MG/0.6ML IJ SOSY
50.0000 mg | PREFILLED_SYRINGE | INTRAMUSCULAR | Status: DC
  Administered 2020-09-25 – 2020-10-02 (×8): 50 mg via SUBCUTANEOUS
  Filled 2020-09-25 (×8): qty 0.6

## 2020-09-25 MED ORDER — BETHANECHOL CHLORIDE 10 MG PO TABS
10.0000 mg | ORAL_TABLET | Freq: Three times a day (TID) | ORAL | Status: DC
  Administered 2020-09-25 – 2020-10-02 (×22): 10 mg via ORAL
  Filled 2020-09-25 (×24): qty 1

## 2020-09-25 NOTE — Progress Notes (Signed)
Physical Therapy Treatment Patient Details Name: [name] MRN: [id number] DOB: [birth year] Today's Date: 09/25/2020   History of Present Illness Pt is a 55 y.o. female admitted 09/12/20 with SOB, fatigue, bruising. Workup for symptomatic anemia, idiopathic thrombocytopenic purpura. 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 CXR 9/18 with concern for bilateral PNA, large L pleural effusion. S/p thoracentesis 9/19. PMH includes R BKA (2013), CHF, DM2, HTN, stroke, ICH, arthritis, neuropathy, chronic indwelling catheter.    PT Comments    Improved cognitive status this date. Focused session on further progressing pt's independence with transfers. Pt eventually agreeable to attempting sliding board transfer bed > w/c 1x after extensive education. Pt required max-TA for sliding board set-up but only minAx2 to complete the transfer. Pt also was successful in clearing her buttocks from the w/c to stand 2x and then pivot 1x to the recliner with maxAx2 and bil UE support. Pt continues to be at high risk for falls and is motivated to improve, agreeing that she may need some rehab prior to going home at this time. Continuing to recommend CIR. Will continue to follow acutely.   Recommendations for follow up therapy are one component of a multi-disciplinary discharge planning process, led by the attending physician.  Recommendations may be updated based on patient status, additional functional criteria and insurance authorization.  Follow Up Recommendations  CIR;SNF;Supervision for mobility/OOB     Equipment Recommendations  Other (comment) (lift equipment)    Recommendations for Other Services       Precautions / Restrictions Precautions Precautions: Fall;Other (comment) Precaution Comments: L abdominal JP drain; R BKA (unable to don prosthesis due to swelling) Restrictions Weight Bearing Restrictions: No     Mobility  Bed Mobility Overal bed mobility: Needs Assistance Bed Mobility: Supine to  Sit     Supine to sit: Supervision;HOB elevated     General bed mobility comments: Increased time and effort, use of bed rails    Transfers Overall transfer level: Needs assistance Equipment used: 2 person hand held assist;Sliding board Transfers: Sit to/from Stand;Lateral/Scoot Transfers;Stand Pivot Transfers Sit to Stand: Max assist;+2 physical assistance;+2 safety/equipment Stand pivot transfers: Max assist;+2 physical assistance;+2 safety/equipment      Lateral/Scoot Transfers: Min assist;+2 physical assistance;+2 safety/equipment;With slide board General transfer comment: Pt initially declining but eventually agreeable to attempt sliding board. Pt needed max-TA for set-up and break-down but minAx2 for steadying and directing pt with sliding board transfer from elevated EOB towards L to w/c with arm dropped. Sit to stand 2x from w/c with maxAx2 and bil UE support with pivot to L on 2nd rep with maxAx2 to get pt to recliner.  Ambulation/Gait             General Gait Details: Unable   Stairs             Wheelchair Mobility    Modified Rankin (Stroke Patients Only)       Balance Overall balance assessment: Needs assistance Sitting-balance support: No upper extremity supported Sitting balance-Leahy Scale: Fair Sitting balance - Comments: prolonged static sitting EOB with and without UE support   Standing balance support: Bilateral upper extremity supported Standing balance-Leahy Scale: Zero Standing balance comment: Bil UE support and maxAx2 to stand statically briefly 2x.                            Cognition Arousal/Alertness: Awake/alert Behavior During Therapy: WFL for tasks assessed/performed Overall Cognitive Status: No family/caregiver present to determine baseline  cognitive functioning Area of Impairment: Safety/judgement;Awareness;Following commands;Memory;Attention;Problem solving                   Current Attention Level:  Sustained Memory: Decreased short-term memory Following Commands: Follows one step commands inconsistently;Follows one step commands with increased time Safety/Judgement: Decreased awareness of safety;Decreased awareness of deficits Awareness: Emergent Problem Solving: Slow processing;Decreased initiation;Difficulty sequencing;Requires verbal cues;Requires tactile cues General Comments: Pt with improved cognitive status as she was able to maintain attention for increased periods of time and appeared to comprehend the extent of her deficits impacting her safety. Pt agreeable to getting rehab before going home. Needed encouragement to attempt sliding board and repeated cues and education on how to perform, but was successful with increased time.      Exercises      General Comments General comments (skin integrity, edema, etc.): educated pt to perform quad sets, glut sets, and LAQ to increase leg extensor strength      Pertinent Vitals/Pain Pain Assessment: Faces Faces Pain Scale: Hurts a little bit Pain Location: generalized grimacing with mobility Pain Descriptors / Indicators: Grimacing Pain Intervention(s): Monitored during session;Limited activity within patient's tolerance;Repositioned    Home Living                      Prior Function            PT Goals (current goals can now be found in the care plan section) Acute Rehab PT Goals Patient Stated Goal: to go to CIR to get better and go home PT Goal Formulation: With patient Time For Goal Achievement: 09/29/20 Potential to Achieve Goals: Good Progress towards PT goals: Progressing toward goals    Frequency    Min 3X/week      PT Plan Current plan remains appropriate    Co-evaluation              AM-PAC PT "6 Clicks" Mobility   Outcome Measure  Help needed turning from your back to your side while in a flat bed without using bedrails?: A Little Help needed moving from lying on your back to  sitting on the side of a flat bed without using bedrails?: A Little Help needed moving to and from a bed to a chair (including a wheelchair)?: Total Help needed standing up from a chair using your arms (e.g., wheelchair or bedside chair)?: Total Help needed to walk in hospital room?: Total Help needed climbing 3-5 steps with a railing? : Total 6 Click Score: 10    End of Session Equipment Utilized During Treatment: Gait belt Activity Tolerance: Patient tolerated treatment well Patient left: in chair;with call bell/phone within reach;with chair alarm set   PT Visit Diagnosis: Unsteadiness on feet (R26.81);Muscle weakness (generalized) (M62.81);Difficulty in walking, not elsewhere classified (R26.2)     Time: 4166-0630 PT Time Calculation (min) (ACUTE ONLY): 23 min  Charges:  $Therapeutic Activity: 23-37 mins                     Raymond Gurney, PT, DPT Acute Rehabilitation Services  Pager: [phone] Office: [phone]    Jewel Baize 09/25/2020, 5:34 PM

## 2020-09-25 NOTE — Progress Notes (Signed)
7 Days Post-Op  Subjective: CC: Patient reports some soreness of the LUQ that is stable. Tolerating diet without n/v. Passing flatus and had a bm this am. Reports sob. On o2. Foley still in place.   Objective: Vital signs in last 24 hours: Temp:  [98.2 F (36.8 C)-100.9 F (38.3 C)] 98.2 F (36.8 C) (09/22 0400) Pulse Rate:  [78-79] 78 (09/22 0400) Resp:  [16-23] 23 (09/22 0400) BP: (134-151)/(50-68) 151/66 (09/22 0400) SpO2:  [95 %-100 %] 100 % (09/22 0400) Weight:  [112.9 kg] 112.9 kg (09/22 0500) Last BM Date: 09/23/20  Intake/Output from previous day: 09/21 0701 - 09/22 0700 In: 2240 [P.O.:2240] Out: 1300 [Urine:1300] Intake/Output this shift: Total I/O In: -  Out: 1500 [Urine:1500]  PE: Gen:  Alert, NAD, pleasant Card:  Reg Pulm:  Decreased breath sounds on the L. CTA on the R. On o2. Normal rate and effort  Abd: Soft, minimal upper abdominal distension, mild tenderness of the epigastrium over incision - otherwise NT, +BS, incisions C/D/I without erythema or drainage, drain with scant, thin brown output  Ext:  R BKA. Psych: A&Ox3  Skin: no rashes noted, warm and dry  Lab Results:  Recent Labs    09/24/20 0342 09/25/20 0358  WBC 17.0* 14.2*  HGB 9.2* 9.0*  HCT 31.9* 29.7*  PLT 757* 790*   BMET Recent Labs    09/24/20 0342 09/25/20 0358  NA 135 137  K 3.9 3.6  CL 101 103  CO2 22 24  GLUCOSE 122* 80  BUN 41* 36*  CREATININE 2.06* 1.71*  CALCIUM 7.5* 7.9*   PT/INR Recent Labs    09/24/20 1035  LABPROT 15.8*  INR 1.3*   CMP     Component Value Date/Time   NA 137 09/25/2020 0358   NA 139 05/16/2019 1522   NA 138 09/30/2015 1118   K 3.6 09/25/2020 0358   K 4.6 09/30/2015 1118   CL 103 09/25/2020 0358   CO2 24 09/25/2020 0358   CO2 19 (L) 09/30/2015 1118   GLUCOSE 80 09/25/2020 0358   GLUCOSE 200 (H) 09/30/2015 1118   BUN 36 (H) 09/25/2020 0358   BUN 23 05/16/2019 1522   BUN 22.3 09/30/2015 1118   CREATININE 1.71 (H) 09/25/2020  0358   CREATININE 2.63 (H) 06/12/2020 0755   CREATININE 1.3 (H) 09/30/2015 1118   CALCIUM 7.9 (L) 09/25/2020 0358   CALCIUM 9.6 09/30/2015 1118   PROT 6.0 (L) 09/25/2020 0358   PROT 8.2 05/16/2019 1522   ALBUMIN 2.1 (L) 09/25/2020 0358   ALBUMIN 4.2 05/16/2019 1522   AST 17 09/25/2020 0358   AST 15 06/12/2020 0755   ALT 13 09/25/2020 0358   ALT 9 06/12/2020 0755   ALKPHOS 74 09/25/2020 0358   BILITOT 0.7 09/25/2020 0358   BILITOT 0.3 06/12/2020 0755   GFRNONAA 35 (L) 09/25/2020 0358   GFRNONAA 21 (L) 06/12/2020 0755   GFRAA 59 (L) 09/19/2019 1449   Lipase     Component Value Date/Time   LIPASE 23 06/26/2019 0124    Studies/Results: CT ABDOMEN PELVIS WO CONTRAST  Result Date: 09/23/2020 CLINICAL DATA:  Abdominal pain. Fever. Postop. Recent laparoscopic splenectomy. EXAM: CT ABDOMEN AND PELVIS WITHOUT CONTRAST TECHNIQUE: Multidetector CT imaging of the abdomen and pelvis was performed following the standard protocol without IV contrast. COMPARISON:  Renal ultrasound 06/12/2020. Abdominopelvic CT 06/26/2019 FINDINGS: Lower chest: Consolidation throughout the included left lower lobe with small pleural effusion. There is enteric contrast in the distal esophagus. Trace  right pleural effusion. Hepatobiliary: Prominent size liver spans 19.3 cm cranial caudal. Mild subjective hepatic steatosis. Focal hepatic abnormality on this unenhanced exam. The gallbladder is decompressed. No visualized calcified gallstone. No biliary dilatation Pancreas: Atrophic.  No ductal dilatation or inflammation. Spleen: Splenectomy. Surgical drain in the left upper quadrant. Small amount of ill-defined fluid and soft tissue density adjacent to the drain without organized collection. Adrenals/Urinary Tract: No adrenal nodule. No hydronephrosis. Mild bilateral perinephric edema. No visualized renal stone or focal lesion. Calcifications at the renal hila are felt to be vascular. Urinary bladder is decompressed by Foley  catheter. Stomach/Bowel: Enteric contrast is seen in the distal esophagus. The stomach is physiologically distended. There is no gastric wall thickening. No obstruction, enteric contrast is seen in the colon. Majority of the small bowel is decompressed. No small bowel inflammation. Normal appendix. Irregular wall thickening of the cecum, series 3, image 73, series 6, image 48. This measures up to 13 mm. No colonic inflammation. Vascular/Lymphatic: Advanced aortic and branch atherosclerosis. No aortic aneurysm. No portal venous or mesenteric gas. No enlarged lymph nodes in the abdomen or pelvis. Reproductive: Unremarkable appearance of the uterus. The ovaries are not well-defined, quiescent appearance of the right ovary. The left ovary is not well seen. No suspicious adnexal mass. Other: Generalized body wall edema, confluent in the left flank. No organized subcutaneous collection. No free air. Minimal ill-defined fluid and soft tissue density in the left upper quadrant with organized abscess or collection. Musculoskeletal: There are no acute or suspicious osseous abnormalities. IMPRESSION: 1. Recent splenectomy. Surgical drain in the left upper quadrant with small amount of ill-defined fluid and soft tissue density adjacent to the drain without organized collection or abscess. 2. Irregular wall thickening of the cecum. If not performed recently, recommend direct visualization with colonoscopy to exclude colonic neoplasm. 3. Consolidation throughout the included left lower lobe with small pleural effusion. Trace right pleural effusion. 4. Anasarca with generalized body wall edema, confluent in the left abdomen and flank. 5. Enteric contrast in the distal esophagus, can be seen with reflux or slow transit. Aortic Atherosclerosis (ICD10-I70.0). Electronically Signed   By: Narda Rutherford M.D.   On: 09/23/2020 17:20   CT HEAD WO CONTRAST ( )  Result Date: 09/23/2020 CLINICAL DATA:  Altered mental status. EXAM: CT  HEAD WITHOUT CONTRAST TECHNIQUE: Contiguous axial images were obtained from the base of the skull through the vertex without intravenous contrast. COMPARISON:  July 30, 2019 FINDINGS: Brain: No evidence of acute hemorrhage, hydrocephalus, extra-axial collection or mass lesion/mass effect. A large area of encephalomalacia is again seen within the left temporal and left parietal regions. Associated ex vacuo dilatation of the posterior aspect of the left lateral ventricle is noted. Vascular: No hyperdense vessel or unexpected calcification. Skull: A left frontal parietal craniotomy defect is seen. Sinuses/Orbits: No acute finding. Other: None. IMPRESSION: 1. No acute intracranial abnormality. 2. Evidence of prior left frontoparietal craniotomy with stable left temporal and left parietal lobe encephalomalacia. Electronically Signed   By: Aram Candela M.D.   On: 09/23/2020 22:32   CT CHEST WO CONTRAST  Result Date: 09/24/2020 CLINICAL DATA:  55 year old female with history of empyema. EXAM: CT CHEST WITHOUT CONTRAST TECHNIQUE: Multidetector CT imaging of the chest was performed following the standard protocol without IV contrast. COMPARISON:  Chest CT 02/13/2019. FINDINGS: Cardiovascular: Heart size is mildly enlarged. There is no significant pericardial fluid, thickening or pericardial calcification. Aortic atherosclerosis. No definite coronary artery calcifications. Mediastinum/Nodes: No pathologically enlarged mediastinal or hilar  lymph nodes. Please note that accurate exclusion of hilar adenopathy is limited on noncontrast CT scans. Esophagus is unremarkable in appearance. No axillary lymphadenopathy. Increasing asymmetric mass-like enlargement of the left lobe of the thyroid gland which is heterogeneous in appearance measuring 5.4 x 4.9 cm (axial image 4 of series 3) on today's examination as compared with 4.6 x 4.4 cm on prior study 02/13/2019. Lungs/Pleura: Trace right pleural effusion lying dependently.  Moderate left pleural effusion which appears partially loculated, predominantly in the region of the major fissure. Areas of passive atelectasis are noted in the adjacent lung, including the left lower lobe which is nearly completely collapsed. Some degree of airspace consolidation is also suspected in the basal segments of the left lower lobe, poorly evaluated on today's noncontrast CT examination. Upper Abdomen: Postoperative changes of recent splenectomy with surgical drain in the upper left retroperitoneum and a small amount of residual postoperative fluid in the surrounding tissues. Musculoskeletal: There are no aggressive appearing lytic or blastic lesions noted in the visualized portions of the skeleton. IMPRESSION: 1. Complex left pleural effusion which is partially loculated, as above, with associated areas of atelectasis in the lungs, and probable airspace consolidation the basal segments of the left lower lobe concerning for pneumonia. 2. Mild cardiomegaly. 3. Asymmetric masslike enlargement of the left lobe of the thyroid gland which is increased compared to the prior study. Recommend thyroid US (ref: J Am Coll Radiol. 2015 Feb;12(2): 143-50). 4. Additional incidental findings, as above. 5. Aortic atherosclerosis. Aortic Atherosclerosis (ICD10-I70.0). Electronically Signed   By: Trudie Reed M.D.   On: 09/24/2020 14:23   DG CHEST PORT 1 VIEW  Result Date: 09/23/2020 CLINICAL DATA:  Shortness of breath. EXAM: PORTABLE CHEST 1 VIEW COMPARISON:  Radiograph yesterday. Lung bases from abdominopelvic CT earlier today. FINDINGS: Left basilar consolidation. Slight increased opacity in the left mid lung zone from radiograph yesterday, suggesting layering effusion. Cardiomegaly is stable. Trace right pleural effusion on CT is not well seen. No pneumothorax. No pulmonary edema. IMPRESSION: Left basilar consolidation. Slight increased opacity in the left mid lung zone from radiograph yesterday, suggesting  layering effusion. Electronically Signed   By: Narda Rutherford M.D.   On: 09/23/2020 20:02    Anti-infectives: Anti-infectives (From admission, onward)    Start     Dose/Rate Route Frequency Ordered Stop   09/25/20 1200  vancomycin (VANCOREADY) IVPB 1750 mg/350 mL        1,750 mg 175 mL/hr over 120 Minutes Intravenous Every 48 hours 09/23/20 1056 09/29/20 2359   09/23/20 1400  ceFEPIme (MAXIPIME) 2 g in sodium chloride 0.9 % 100 mL IVPB        2 g 200 mL/hr over 30 Minutes Intravenous Every 12 hours 09/23/20 1047 09/29/20 2359   09/23/20 1400  metroNIDAZOLE (FLAGYL) tablet 500 mg  Status:  Discontinued        500 mg Oral Every 12 hours 09/23/20 1047 09/24/20 1028   09/23/20 1200  vancomycin (VANCOREADY) IVPB 2000 mg/400 mL        2,000 mg 200 mL/hr over 120 Minutes Intravenous  Once 09/23/20 1047 09/23/20 1326   09/21/20 1400  piperacillin-tazobactam (ZOSYN) IVPB 3.375 g  Status:  Discontinued        3.375 g 12.5 mL/hr over 240 Minutes Intravenous Every 8 hours 09/21/20 1122 09/23/20 1047   09/18/20 1115  cefoTEtan (CEFOTAN) 2 g in sodium chloride 0.9 % 100 mL IVPB        2 g 200 mL/hr over 30  Minutes Intravenous  Once 09/18/20 1112 09/18/20 1250   09/18/20 1114  sodium chloride 0.9 % with cefoTEtan (CEFOTAN) ADS Med       Note to Pharmacy: Janene Harvey   : cabinet override      09/18/20 1114 09/18/20 1253        Assessment/Plan POD#7 ITP/Thrombocytopenia s/p Hand-assisted laparoscopic splenectomy 6/15 Dr. Donell Beers - Received vaccines pre-op - CT 9/20 reassuring and without organized abscess - Cont JP drain - Mobilize. PT rec CIR vs SNF (patient wishes to go home per notes)   FEN - CM VTE - SCD, Lovenox  ID - Vanc/Cefepime until 9/26 per ID. T max 100.9 Foley - in place. Urecholine. TOV again soon?   - Per TR[facility] -  Staph epi bacteremia - ID following. Repeat blood cx's pending.  L pleural effusion s/p thora 9/19. Cx pending. Loculated pleural effusion on CT 9/21 Cecal wall  thickening - noted on CT, GI following and determining timing of colonoscopy  ABL anemia - hgb stable at 9.0 WC bound due to RLE BKA DM CHF HTN H/O CVA AKI - Improving, Cr 2.06 > 1.71    LOS: 13 days    Jacinto Halim , [facility] Surgery 09/25/2020, 8:43 AM Please see Amion for pager number during day hours 7:00am-4:30pm

## 2020-09-25 NOTE — Plan of Care (Signed)
Patient progressing 

## 2020-09-25 NOTE — Progress Notes (Signed)
PROGRESS NOTE  [name] JOI:[id number] DOB: 08-08-1965 DOA: 09/12/2020 PCP: Annita Brod, MD   LOS: 13 days   Brief narrative:  [name] is a 55 y.o. female with medical history significant for ITP, type 2 diabetes mellitus, chronic kidney disease, chronic diastolic CHF, chronic hypoxic respiratory failure, presented to the hospital with shortness of breath, fatigue, and bruising for one month and she is now unable to get up without assistance.  Patient stated that she was out of Lasix for 1 month.  Patient was supposed to be following up with hem-oncology but had been busy and was not able to follow-up with him.  In the ED, patient was hemodynamically stable but hemoglobin was 4.5, platelet less than 5.  WBC 2.8.  Patient had mild hyperkalemia with elevated creatinine of 1.8.  Chest x-ray showed cardiomegaly with small pleural effusion and pulmonary edema.  In the ED, patient was treated with IV Lasix, Protonix, dexamethasone, and Lokelma. 2 units PRBC were ordered and was admitted to the hospital.   Assessment/Plan:  Principal Problem:   Symptomatic anemia Active Problems:   Type II diabetes mellitus (HCC)   CKD (chronic kidney disease) stage 3, GFR 30-59 ml/min (HCC)   Idiopathic thrombocytopenic purpura (ITP) (HCC)   Acute on chronic diastolic CHF (congestive heart failure) (HCC)   Pancytopenia (HCC)   Lobar pneumonia, unspecified organism (HCC)   Bacteremia   Abnormal CT scan, colon   Iron deficiency anemia  Idiopathic thrombocytopenic purpura; pancytopenia; symptomatic anemia  Presented with platelet of < 5. patient received IV steroids, received Nplate and IVIG.  Oncology on board.  Received 2 units of packed RBC during hospitalization..received Aranesp and Feraheme on 09/15/2020. continue folic acid.  Patient has received pneumococcal meningococcal and haemophilus vaccination.  oncology recommended splenectomy.  General surgery consulted.  Patient underwent a  splenectomy on 09/18/2020.  Platelets rising postoperatively as expected.  Due to persistent fever, CT abdomen and pelvis was repeated 09/23/2020 which showed regular expected postop changes, no abscess however also showed irregular cecal wall thickening.    Sepsis secondary to hospital-acquired pneumonia/large left pleural effusion/gram-positive bacteremia, not POA: Patient continues to have fever since 09/21/2020 with latest temperature at 4 PM yesterday of 102.1.  Leukocytosis is improving.  S/p left-sided thoracentesis 09/22/2020 which seems to be exudative.  Chest x-ray improved after that but repeat chest x-ray on the night of 09/23/2020 shows once again accumulation of the pleural effusion on the left side.  She is requiring 6 L oxygen.  CT chest obtained per IR recommendations which show some mild empyema on the left side.  Per IR, it is too small to need a thoracentesis or chest tube.  Patient's blood culture is also growing MRSE.  Antibiotics were transitioned from cefepime from Zosyn and vancomycin added on 09/23/2020.  Pleural fluid culture negative.  ID consulted 09/24/2020.  Antibiotics transition to cefepime and continue vancomycin.  Per ID, no need for repeat thoracentesis.  Recommend continuing 7 days of antibiotics with end date of 09/29/2020.  Flagyl stopped.  Blood culture repeated  Acute on chronic anemia: Likely due to acute illness.  Hemoglobin dropped to 7.0 on 09/22/2020, s/p 1 unit WBC.  Hemoglobin improved posttransfusion as expected and has remained stable.  Cecal wall thickening: CT abdomen shows cecal wall thickening.  GI consulted.  Needs colonoscopy but once she is more stable according to GI.  Hypokalemia: Resolved.   Essential hypertension: Blood pressure now fairly within normal range.  Continue Toprol-XL.  Continue  to hold rest of the antihypertensives which included Imdur, amlodipine.  Acute on chronic diastolic CHF with chronic hypoxic respiratory failure  Patient was out of  her Lasix at home.  Appears volume overloaded.  Patient was taking 160 mg of Lasix daily.  Lasix was on hold due to rising creatinine until 09/24/2020.  Resumed at low-dose of 40 mg p.o. twice daily.  Renal function improved.     AKI on CKD IIIb/obstructive uropathy - SCr is 1.83 on admission, up from 1.51 in June 2022.  Creatinine remained stable around 1.2 but has started to rise since 09/21/2020. Lasix was discontinued 09/21/2020.  Patient also having some obstructive uropathy.  Foley placed 09/22/2020.  Will continue Lasix.  Renal function improving.  Hypocalcemia: Corrected calcium within normal range.   Type II DM with hyperglycemia Hemoglobin A1c was 6.2% in June 2022.  Blood sugar controlled on SSI.  Continue this.  Debility, deconditioning, ambulatory dysfunction. States that she has DME wheelchair at home for ambulation, PT recommended SNF.  Patient declines that and wants to go home.  PT now updated and recommends CIR.  Acute hyponatremia: Resolved.  DVT prophylaxis: SCDs Start: 09/12/20 1951  Code Status: Full code  Family Communication: None  Status is: Inpatient  Remains inpatient appropriate because:IV treatments appropriate due to intensity of illness or inability to take PO and Inpatient level of care appropriate due to severity of illness  Dispo: The patient is from: Home              Anticipated d/c is to: CIR              Patient currently is not medically stable to d/c.   Difficult to place patient No  Consultants: Medical oncology General surgery ID GI  Procedures: As above  Anti-infectives:    Anti-infectives (From admission, onward)    Start     Dose/Rate Route Frequency Ordered Stop   09/25/20 1200  vancomycin (VANCOREADY) IVPB 1750 mg/350 mL        1,750 mg 175 mL/hr over 120 Minutes Intravenous Every 48 hours 09/23/20 1056 09/29/20 2359   09/23/20 1400  ceFEPIme (MAXIPIME) 2 g in sodium chloride 0.9 % 100 mL IVPB        2 g 200 mL/hr over 30  Minutes Intravenous Every 12 hours 09/23/20 1047 09/29/20 2359   09/23/20 1400  metroNIDAZOLE (FLAGYL) tablet 500 mg  Status:  Discontinued        500 mg Oral Every 12 hours 09/23/20 1047 09/24/20 1028   09/23/20 1200  vancomycin (VANCOREADY) IVPB 2000 mg/400 mL        2,000 mg 200 mL/hr over 120 Minutes Intravenous  Once 09/23/20 1047 09/23/20 1326   09/21/20 1400  piperacillin-tazobactam (ZOSYN) IVPB 3.375 g  Status:  Discontinued        3.375 g 12.5 mL/hr over 240 Minutes Intravenous Every 8 hours 09/21/20 1122 09/23/20 1047   09/18/20 1115  cefoTEtan (CEFOTAN) 2 g in sodium chloride 0.9 % 100 mL IVPB        2 g 200 mL/hr over 30 Minutes Intravenous  Once 09/18/20 1112 09/18/20 1250   09/18/20 1114  sodium chloride 0.9 % with cefoTEtan (CEFOTAN) ADS Med       Note to Pharmacy: Janene Harvey   : cabinet override      09/18/20 1114 09/18/20 1253      Subjective: Seen and examined.  Feels better.  Minimal shortness of breath.  No other complaint.  Objective: Vitals:   09/25/20 0013 09/25/20 0400  BP: (!) 138/56 (!) 151/66  Pulse: 78 78  Resp: (!) 21 (!) 23  Temp: 99.5 F (37.5 C) 98.2 F (36.8 C)  SpO2: 95% 100%    Intake/Output Summary (Last 24 hours) at 09/25/2020 1111 Last data filed at 09/25/2020 0800 Gross per 24 hour  Intake --  Output 2400 ml  Net -2400 ml    Filed Weights   09/23/20 0605 09/24/20 0414 09/25/20 0500  Weight: 110.7 kg 112.1 kg 112.9 kg   Body mass index is 38.98 kg/m.   Physical Exam:  General exam: Appears calm and comfortable  Respiratory system: Diminished breath sounds at the left middle and lower lobe.  Some rhonchi at the left middle lobe.  Respiratory effort normal. Cardiovascular system: S1 & S2 heard, RRR. No JVD, murmurs, rubs, gallops or clicks. No pedal edema. Gastrointestinal system: Abdomen is nondistended, soft and nontender. No organomegaly or masses felt. Normal bowel sounds heard.  JP drain in place. Central nervous system:  Alert and oriented. No focal neurological deficits. Extremities: Right BKA. Skin: No rashes, lesions or ulcers.  Psychiatry: Judgement and insight appear normal. Mood & affect appropriate.    Data Review: I have personally reviewed the following laboratory data and studies,  CBC: Recent Labs  Lab 09/22/20 0752 09/22/20 2346 09/23/20 0939 09/23/20 2008 09/24/20 0342 09/25/20 0358  WBC 25.1* 21.5* 19.0* 17.9* 17.0* 14.2*  NEUTROABS 18.8* 16.1*  --  14.9* 12.6* 9.6*  HGB 7.0* 8.2* 8.6* 8.4* 9.2* 9.0*  HCT 24.9* 27.0* 29.0* 28.7* 31.9* 29.7*  MCV 87.1 85.7 86.6 87.2 87.4 85.8  PLT 607* 638* 637* 723* 757* 790*    Basic Metabolic Panel: Recent Labs  Lab 09/22/20 0752 09/22/20 2346 09/23/20 0939 09/23/20 2008 09/24/20 0342 09/25/20 0358  NA 129* 131* 134* 135 135 137  K 4.1 3.7 4.4 3.9 3.9 3.6  CL 96* 98 102 102 101 103  CO2 23 24 21* 22 22 24   GLUCOSE 93 118* 124* 168* 122* 80  BUN 46* 46* 45* 44* 41* 36*  CREATININE 2.20* 2.10* 1.99* 2.14* 2.06* 1.71*  CALCIUM 6.3* 6.8* 7.2* 7.1* 7.5* 7.9*  MG 1.3*  --   --   --   --   --     Liver Function Tests: Recent Labs  Lab 09/22/20 2346 09/23/20 0939 09/23/20 2008 09/24/20 0342 09/25/20 0358  AST 23 33 28 24 17   ALT 16 15 16 16 13   ALKPHOS 67 69 75 83 74  BILITOT 0.4 0.6 0.5 0.6 0.7  PROT 5.8* 5.9* 6.2* 6.4* 6.0*  ALBUMIN 2.0* 2.0* 2.0* 2.2* 2.1*    Recent Labs  Lab 09/22/20 1825  AMYLASE 75    No results for input(s): AMMONIA in the last 168 hours. Cardiac Enzymes: No results for input(s): CKTOTAL, CKMB, CKMBINDEX, TROPONINI in the last 168 hours. BNP (last 3 results) Recent Labs    06/12/20 0954 09/12/20 1830 09/23/20 2008  BNP 774.5* 1,139.8* 1,080.6*     ProBNP (last 3 results) No results for input(s): PROBNP in the last 8760 hours.  CBG: Recent Labs  Lab 09/24/20 1110 09/24/20 1648 09/24/20 2041 09/24/20 2353 09/25/20 0414  GLUCAP 110* 100* 134* 101* 85    Recent Results (from the  past 240 hour(s))  Culture, blood (routine x 2)     Status: Abnormal (Preliminary result)   Collection Time: 09/21/20 10:57 AM   Specimen: BLOOD  Result Value Ref Range Status   Specimen  Description BLOOD SITE NOT SPECIFIED  Final   Special Requests   Final    BOTTLES DRAWN AEROBIC AND ANAEROBIC Blood Culture adequate volume   Culture  Setup Time   Final    AEROBIC BOTTLE ONLY GRAM POSITIVE COCCI IN CLUSTERS CRITICAL VALUE NOTED.  VALUE IS CONSISTENT WITH PREVIOUSLY REPORTED AND CALLED VALUE.    Culture (A)  Final    STAPHYLOCOCCUS EPIDERMIDIS SUSCEPTIBILITIES TO FOLLOW Performed at [facility], [address], [place], [place] [address]    Report Status PENDING  Incomplete  Culture, blood (routine x 2)     Status: Abnormal   Collection Time: 09/21/20 11:20 AM   Specimen: BLOOD RIGHT HAND  Result Value Ref Range Status   Specimen Description BLOOD RIGHT HAND  Final   Special Requests   Final    BOTTLES DRAWN AEROBIC ONLY Blood Culture adequate volume   Culture  Setup Time   Final    GRAM POSITIVE COCCI IN CLUSTERS AEROBIC BOTTLE ONLY CRITICAL RESULT CALLED TO, READ BACK BY AND VERIFIED WITHCeledonio Miyamoto [facility] 6045 09/22/20 A BROWNING Performed at [facility], [address], [place], [place] [address]    Culture STAPHYLOCOCCUS EPIDERMIDIS (A)  Final   Report Status 09/24/2020 FINAL  Final   Organism ID, Bacteria STAPHYLOCOCCUS EPIDERMIDIS  Final      Susceptibility   Staphylococcus epidermidis - MIC*    CIPROFLOXACIN <=0.5 SENSITIVE Sensitive     ERYTHROMYCIN >=8 RESISTANT Resistant     GENTAMICIN <=0.5 SENSITIVE Sensitive     OXACILLIN >=4 RESISTANT Resistant     TETRACYCLINE >=16 RESISTANT Resistant     VANCOMYCIN 2 SENSITIVE Sensitive     TRIMETH/SULFA <=10 SENSITIVE Sensitive     CLINDAMYCIN <=0.25 SENSITIVE Sensitive     RIFAMPIN <=0.5 SENSITIVE Sensitive     Inducible Clindamycin NEGATIVE Sensitive     * STAPHYLOCOCCUS EPIDERMIDIS  Blood Culture ID  Panel (Reflexed)     Status: Abnormal   Collection Time: 09/21/20 11:20 AM  Result Value Ref Range Status   Enterococcus faecalis NOT DETECTED NOT DETECTED Final   Enterococcus Faecium NOT DETECTED NOT DETECTED Final   Listeria monocytogenes NOT DETECTED NOT DETECTED Final   Staphylococcus species DETECTED (A) NOT DETECTED Final    Comment: CRITICAL RESULT CALLED TO, READ BACK BY AND VERIFIED WITH: Celedonio Miyamoto PHARMD 1627 09/22/20 A BROWNING    Staphylococcus aureus (BCID) NOT DETECTED NOT DETECTED Final   Staphylococcus epidermidis DETECTED (A) NOT DETECTED Final    Comment: Methicillin (oxacillin) resistant coagulase negative staphylococcus. Possible blood culture contaminant (unless isolated from more than one blood culture draw or clinical case suggests pathogenicity). No antibiotic treatment is indicated for blood  culture contaminants. CRITICAL RESULT CALLED TO, READ BACK BY AND VERIFIED WITH: Celedonio Miyamoto PHARMD 1627 09/22/20 A BROWNING    Staphylococcus lugdunensis NOT DETECTED NOT DETECTED Final   Streptococcus species NOT DETECTED NOT DETECTED Final   Streptococcus agalactiae NOT DETECTED NOT DETECTED Final   Streptococcus pneumoniae NOT DETECTED NOT DETECTED Final   Streptococcus pyogenes NOT DETECTED NOT DETECTED Final   A.calcoaceticus-baumannii NOT DETECTED NOT DETECTED Final   Bacteroides fragilis NOT DETECTED NOT DETECTED Final   Enterobacterales NOT DETECTED NOT DETECTED Final   Enterobacter cloacae complex NOT DETECTED NOT DETECTED Final   Escherichia coli NOT DETECTED NOT DETECTED Final   Klebsiella aerogenes NOT DETECTED NOT DETECTED Final   Klebsiella oxytoca NOT DETECTED NOT DETECTED Final   Klebsiella pneumoniae NOT DETECTED NOT  DETECTED Final   Proteus species NOT DETECTED NOT DETECTED Final   Salmonella species NOT DETECTED NOT DETECTED Final   Serratia marcescens NOT DETECTED NOT DETECTED Final   Haemophilus influenzae NOT DETECTED NOT DETECTED Final    Neisseria meningitidis NOT DETECTED NOT DETECTED Final   Pseudomonas aeruginosa NOT DETECTED NOT DETECTED Final   Stenotrophomonas maltophilia NOT DETECTED NOT DETECTED Final   Candida albicans NOT DETECTED NOT DETECTED Final   Candida auris NOT DETECTED NOT DETECTED Final   Candida glabrata NOT DETECTED NOT DETECTED Final   Candida krusei NOT DETECTED NOT DETECTED Final   Candida parapsilosis NOT DETECTED NOT DETECTED Final   Candida tropicalis NOT DETECTED NOT DETECTED Final   Cryptococcus neoformans/gattii NOT DETECTED NOT DETECTED Final   Methicillin resistance mecA/C DETECTED (A) NOT DETECTED Final    Comment: CRITICAL RESULT CALLED TO, READ BACK BY AND VERIFIED WITHCeledonio Miyamoto [facility] 4098 09/22/20 A BROWNING Performed at [facility], [address], [place], [place] [address]   Culture, body fluid w Gram Stain-bottle     Status: None (Preliminary result)   Collection Time: 09/22/20  4:51 PM   Specimen: Pleura  Result Value Ref Range Status   Specimen Description PLEURAL  Final   Special Requests NONE  Final   Culture   Final    NO GROWTH 2 DAYS Performed at [facility], [address], [place], [place] [address]    Report Status PENDING  Incomplete  Gram stain     Status: None   Collection Time: 09/22/20  4:51 PM   Specimen: Pleura  Result Value Ref Range Status   Specimen Description PLEURAL  Final   Special Requests NONE  Final   Gram Stain   Final    ABUNDANT WBC PRESENT, PREDOMINANTLY PMN NO ORGANISMS SEEN Performed at [facility], [address] [place], [place] [address]    Report Status 09/22/2020 FINAL  Final      Studies: CT ABDOMEN PELVIS WO CONTRAST  Result Date: 09/23/2020 CLINICAL DATA:  Abdominal pain. Fever. Postop. Recent laparoscopic splenectomy. EXAM: CT ABDOMEN AND PELVIS WITHOUT CONTRAST TECHNIQUE: Multidetector CT imaging of the abdomen and pelvis was performed following the standard protocol without IV contrast.  COMPARISON:  Renal ultrasound 06/12/2020. Abdominopelvic CT 06/26/2019 FINDINGS: Lower chest: Consolidation throughout the included left lower lobe with small pleural effusion. There is enteric contrast in the distal esophagus. Trace right pleural effusion. Hepatobiliary: Prominent size liver spans 19.3 cm cranial caudal. Mild subjective hepatic steatosis. Focal hepatic abnormality on this unenhanced exam. The gallbladder is decompressed. No visualized calcified gallstone. No biliary dilatation Pancreas: Atrophic.  No ductal dilatation or inflammation. Spleen: Splenectomy. Surgical drain in the left upper quadrant. Small amount of ill-defined fluid and soft tissue density adjacent to the drain without organized collection. Adrenals/Urinary Tract: No adrenal nodule. No hydronephrosis. Mild bilateral perinephric edema. No visualized renal stone or focal lesion. Calcifications at the renal hila are felt to be vascular. Urinary bladder is decompressed by Foley catheter. Stomach/Bowel: Enteric contrast is seen in the distal esophagus. The stomach is physiologically distended. There is no gastric wall thickening. No obstruction, enteric contrast is seen in the colon. Majority of the small bowel is decompressed. No small bowel inflammation. Normal appendix. Irregular wall thickening of the cecum, series 3, image 73, series 6, image 48. This measures up to 13 mm. No colonic inflammation. Vascular/Lymphatic: Advanced aortic and branch atherosclerosis. No aortic aneurysm. No portal venous or mesenteric gas.  No enlarged lymph nodes in the abdomen or pelvis. Reproductive: Unremarkable appearance of the uterus. The ovaries are not well-defined, quiescent appearance of the right ovary. The left ovary is not well seen. No suspicious adnexal mass. Other: Generalized body wall edema, confluent in the left flank. No organized subcutaneous collection. No free air. Minimal ill-defined fluid and soft tissue density in the left upper  quadrant with organized abscess or collection. Musculoskeletal: There are no acute or suspicious osseous abnormalities. IMPRESSION: 1. Recent splenectomy. Surgical drain in the left upper quadrant with small amount of ill-defined fluid and soft tissue density adjacent to the drain without organized collection or abscess. 2. Irregular wall thickening of the cecum. If not performed recently, recommend direct visualization with colonoscopy to exclude colonic neoplasm. 3. Consolidation throughout the included left lower lobe with small pleural effusion. Trace right pleural effusion. 4. Anasarca with generalized body wall edema, confluent in the left abdomen and flank. 5. Enteric contrast in the distal esophagus, can be seen with reflux or slow transit. Aortic Atherosclerosis (ICD10-I70.0). Electronically Signed   By: Narda Rutherford M.D.   On: 09/23/2020 17:20   CT HEAD WO CONTRAST ( )  Result Date: 09/23/2020 CLINICAL DATA:  Altered mental status. EXAM: CT HEAD WITHOUT CONTRAST TECHNIQUE: Contiguous axial images were obtained from the base of the skull through the vertex without intravenous contrast. COMPARISON:  July 30, 2019 FINDINGS: Brain: No evidence of acute hemorrhage, hydrocephalus, extra-axial collection or mass lesion/mass effect. A large area of encephalomalacia is again seen within the left temporal and left parietal regions. Associated ex vacuo dilatation of the posterior aspect of the left lateral ventricle is noted. Vascular: No hyperdense vessel or unexpected calcification. Skull: A left frontal parietal craniotomy defect is seen. Sinuses/Orbits: No acute finding. Other: None. IMPRESSION: 1. No acute intracranial abnormality. 2. Evidence of prior left frontoparietal craniotomy with stable left temporal and left parietal lobe encephalomalacia. Electronically Signed   By: Aram Candela M.D.   On: 09/23/2020 22:32   CT CHEST WO CONTRAST  Result Date: 09/24/2020 CLINICAL DATA:  55 year old  female with history of empyema. EXAM: CT CHEST WITHOUT CONTRAST TECHNIQUE: Multidetector CT imaging of the chest was performed following the standard protocol without IV contrast. COMPARISON:  Chest CT 02/13/2019. FINDINGS: Cardiovascular: Heart size is mildly enlarged. There is no significant pericardial fluid, thickening or pericardial calcification. Aortic atherosclerosis. No definite coronary artery calcifications. Mediastinum/Nodes: No pathologically enlarged mediastinal or hilar lymph nodes. Please note that accurate exclusion of hilar adenopathy is limited on noncontrast CT scans. Esophagus is unremarkable in appearance. No axillary lymphadenopathy. Increasing asymmetric mass-like enlargement of the left lobe of the thyroid gland which is heterogeneous in appearance measuring 5.4 x 4.9 cm (axial image 4 of series 3) on today's examination as compared with 4.6 x 4.4 cm on prior study 02/13/2019. Lungs/Pleura: Trace right pleural effusion lying dependently. Moderate left pleural effusion which appears partially loculated, predominantly in the region of the major fissure. Areas of passive atelectasis are noted in the adjacent lung, including the left lower lobe which is nearly completely collapsed. Some degree of airspace consolidation is also suspected in the basal segments of the left lower lobe, poorly evaluated on today's noncontrast CT examination. Upper Abdomen: Postoperative changes of recent splenectomy with surgical drain in the upper left retroperitoneum and a small amount of residual postoperative fluid in the surrounding tissues. Musculoskeletal: There are no aggressive appearing lytic or blastic lesions noted in the visualized portions of the skeleton. IMPRESSION: 1. Complex left  pleural effusion which is partially loculated, as above, with associated areas of atelectasis in the lungs, and probable airspace consolidation the basal segments of the left lower lobe concerning for pneumonia. 2. Mild  cardiomegaly. 3. Asymmetric masslike enlargement of the left lobe of the thyroid gland which is increased compared to the prior study. Recommend thyroid US (ref: J Am Coll Radiol. 2015 Feb;12(2): 143-50). 4. Additional incidental findings, as above. 5. Aortic atherosclerosis. Aortic Atherosclerosis (ICD10-I70.0). Electronically Signed   By: Trudie Reed M.D.   On: 09/24/2020 14:23   DG CHEST PORT 1 VIEW  Result Date: 09/23/2020 CLINICAL DATA:  Shortness of breath. EXAM: PORTABLE CHEST 1 VIEW COMPARISON:  Radiograph yesterday. Lung bases from abdominopelvic CT earlier today. FINDINGS: Left basilar consolidation. Slight increased opacity in the left mid lung zone from radiograph yesterday, suggesting layering effusion. Cardiomegaly is stable. Trace right pleural effusion on CT is not well seen. No pneumothorax. No pulmonary edema. IMPRESSION: Left basilar consolidation. Slight increased opacity in the left mid lung zone from radiograph yesterday, suggesting layering effusion. Electronically Signed   By: Narda Rutherford M.D.   On: 09/23/2020 20:02     Total time spent in minutes: 30 Hughie Closs, MD  [facility] 09/25/2020  If 7PM-7AM, please contact night-coverage

## 2020-09-25 NOTE — Progress Notes (Signed)
Heart Failure Navigator Progress Note  Assessed for Heart & Vascular TOC clinic readiness.  Patient does not meet criteria due to stay related to medication noncompliance and complicated by splenectomy. Pt functional status very weak upon admission, continued throughout stay. Evaluation by CIR, pt states she does not feel able to participate in intensive therapy required.   Navigator available for reassessment of patient.   Ozella Rocks, MSN, RN Heart Failure Nurse Navigator [phone]

## 2020-09-25 NOTE — Progress Notes (Signed)
Interventional Radiology Brief Note:  CT Chest obtained due to suspicion for recurrent left pleural effusion vs. Empyema in the setting of hypoxia.  CT reviewed by Dr. Grace Isaac who notes a small loculated left sided pleural effusion, not likely amenable to repeat thoracentesis or chest tube placement at this time.  The majority of the opacification on CXR appears to be due to lung consolidation/PNA. No plans in IR at this time.  Ordering MD aware.   Loyce Dys, MS RD PA-C

## 2020-09-26 DIAGNOSIS — D649 Anemia, unspecified: Secondary | ICD-10-CM | POA: Diagnosis not present

## 2020-09-26 LAB — CBC WITH DIFFERENTIAL/PLATELET
Abs Immature Granulocytes: 0.07 10*3/uL (ref 0.00–0.07)
Basophils Absolute: 0.1 10*3/uL (ref 0.0–0.1)
Basophils Relative: 1 %
Eosinophils Absolute: 0.6 10*3/uL — ABNORMAL HIGH (ref 0.0–0.5)
Eosinophils Relative: 3 %
HCT: 27.5 % — ABNORMAL LOW (ref 36.0–46.0)
Hemoglobin: 8.1 g/dL — ABNORMAL LOW (ref 12.0–15.0)
Immature Granulocytes: 0 %
Lymphocytes Relative: 7 %
Lymphs Abs: 1.1 10*3/uL (ref 0.7–4.0)
MCH: 25.5 pg — ABNORMAL LOW (ref 26.0–34.0)
MCHC: 29.5 g/dL — ABNORMAL LOW (ref 30.0–36.0)
MCV: 86.5 fL (ref 80.0–100.0)
Monocytes Absolute: 3.1 10*3/uL — ABNORMAL HIGH (ref 0.1–1.0)
Monocytes Relative: 19 %
Neutro Abs: 11.8 10*3/uL — ABNORMAL HIGH (ref 1.7–7.7)
Neutrophils Relative %: 70 %
Platelets: 746 10*3/uL — ABNORMAL HIGH (ref 150–400)
RBC: 3.18 MIL/uL — ABNORMAL LOW (ref 3.87–5.11)
RDW: 23.2 % — ABNORMAL HIGH (ref 11.5–15.5)
WBC: 16.8 10*3/uL — ABNORMAL HIGH (ref 4.0–10.5)
nRBC: 0 % (ref 0.0–0.2)

## 2020-09-26 LAB — GLUCOSE, CAPILLARY
Glucose-Capillary: 139 mg/dL — ABNORMAL HIGH (ref 70–99)
Glucose-Capillary: 97 mg/dL (ref 70–99)
Glucose-Capillary: 142 mg/dL — ABNORMAL HIGH (ref 70–99)
Glucose-Capillary: 150 mg/dL — ABNORMAL HIGH (ref 70–99)
Glucose-Capillary: 106 mg/dL — ABNORMAL HIGH (ref 70–99)
Glucose-Capillary: 149 mg/dL — ABNORMAL HIGH (ref 70–99)

## 2020-09-26 LAB — CULTURE, BLOOD (ROUTINE X 2): Special Requests: ADEQUATE

## 2020-09-26 LAB — BASIC METABOLIC PANEL
Anion gap: 8 (ref 5–15)
BUN: 32 mg/dL — ABNORMAL HIGH (ref 6–20)
CO2: 25 mmol/L (ref 22–32)
Calcium: 7.7 mg/dL — ABNORMAL LOW (ref 8.9–10.3)
Chloride: 103 mmol/L (ref 98–111)
Creatinine, Ser: 1.47 mg/dL — ABNORMAL HIGH (ref 0.44–1.00)
GFR, Estimated: 42 mL/min — ABNORMAL LOW (ref >60)
Glucose, Bld: 109 mg/dL — ABNORMAL HIGH (ref 70–99)
Potassium: 3.5 mmol/L (ref 3.5–5.1)
Sodium: 136 mmol/L (ref 135–145)

## 2020-09-26 LAB — AMYLASE, BODY FLUID (OTHER)

## 2020-09-26 LAB — MAGNESIUM: Magnesium: 1.7 mg/dL (ref 1.7–2.4)

## 2020-09-26 LAB — TSH: TSH: 6.884 u[IU]/mL — ABNORMAL HIGH (ref 0.350–4.500)

## 2020-09-26 NOTE — Progress Notes (Signed)
Pharmacy Antibiotic Note  [name] is a 55 y.o. female admitted on 09/12/2020 with ITP.  Now s/p splenectomy on 9/15. Noted that patient has been having fevers up to 102 since 9/18. Now with blood cultures positive for gram positive cocci in 2/2 and MRSE on the [employer]. Pharmacy has been consulted for vancomycin dosing. Patient currently on vancomycin 1750mg  q48h.   Plan: Continue vancomycin 1750 mg every 48 hours  Follow up scr tomorrow and if continues to decline will need to adjust vancomycin dose  Monitor blood cultures, fever curve, renal function  Continue antibiotics through 9/26 per ID  Height: 5\' 7"  (170.2 cm) Weight: 109.6 kg (241 lb 10 oz) IBW/kg (Calculated) : 61.6  Temp (24hrs), Avg:99 F (37.2 C), Min:98.5 F (36.9 C), Max:99.3 F (37.4 C)  Recent Labs  Lab 09/22/20 0854 09/22/20 1216 09/22/20 2346 09/23/20 0939 09/23/20 2008 09/23/20 2033 09/23/20 2246 09/24/20 0342 09/25/20 0358 09/26/20 0227  WBC  --   --    < > 19.0* 17.9*  --   --  17.0* 14.2* 16.8*  CREATININE  --   --    < > 1.99* 2.14*  --   --  2.06* 1.71* 1.47*  LATICACIDVEN 0.6 0.9  --   --   --  1.0 1.1  --   --   --    < > = values in this interval not displayed.     Estimated Creatinine Clearance: 56.5 mL/min (A) (by C-G formula based on SCr of 1.47 mg/dL (H)).    No Known Allergies  Antimicrobials this admission: 9/18 Zosyn >> 9/20 9/20 Metronidazole >> 9/21 9/20 Cefepime >> 9/20 Vancomycin >>  Microbiology results: 9/18 BCx: 2/2 gram positive cocci (MRSE) 9/19 pleural fluid: ngtd 9/21 Bcx: ngtd   Thank you for allowing pharmacy to be a part of this patient's care.  10/19, PharmD, BCPS Clinical Pharmacist  09/26/2020 9:22 AM

## 2020-09-26 NOTE — Progress Notes (Signed)
PROGRESS NOTE  [name]:[id number] DOB: 09/14/1965 DOA: 09/12/2020 PCP: Annita Brod, MD   LOS: 14 days   Brief narrative:  [name] is a 55 y.o. female with medical history significant for ITP, type 2 diabetes mellitus, chronic kidney disease, chronic diastolic CHF, chronic hypoxic respiratory failure, presented to the hospital with shortness of breath, fatigue, and bruising for one month and she is now unable to get up without assistance.  Patient stated that she was out of Lasix for 1 month.  Patient was supposed to be following up with hem-oncology but had been busy and was not able to follow-up with him.  In the ED, patient was hemodynamically stable but hemoglobin was 4.5, platelet less than 5.  WBC 2.8.  Patient had mild hyperkalemia with elevated creatinine of 1.8.  Chest x-ray showed cardiomegaly with small pleural effusion and pulmonary edema.  In the ED, patient was treated with IV Lasix, Protonix, dexamethasone, and Lokelma. 2 units PRBC were ordered and was admitted to the hospital.   Assessment/Plan:  Principal Problem:   Symptomatic anemia Active Problems:   Type II diabetes mellitus (HCC)   CKD (chronic kidney disease) stage 3, GFR 30-59 ml/min (HCC)   Idiopathic thrombocytopenic purpura (ITP) (HCC)   Acute on chronic diastolic CHF (congestive heart failure) (HCC)   Pancytopenia (HCC)   Lobar pneumonia, unspecified organism (HCC)   Bacteremia   Abnormal CT scan, colon   Iron deficiency anemia  Idiopathic thrombocytopenic purpura; pancytopenia; symptomatic anemia  Presented with platelet of < 5. patient received IV steroids, received Nplate and IVIG.  Oncology on board.  Received 2 units of packed RBC during hospitalization..received Aranesp and Feraheme on 09/15/2020. continue folic acid.  Patient has received pneumococcal meningococcal and haemophilus vaccination.  oncology recommended splenectomy. Patient underwent splenectomy on 09/18/2020.  Platelets  rising postoperatively as expected.  Due to persistent fever, CT abdomen and pelvis was repeated 09/23/2020 which showed regular expected postop changes however also showed irregular cecal wall thickening.  Abdominal pain and tenderness improving.  Management per general surgery.  Sepsis secondary to hospital-acquired pneumonia/large left pleural effusion/gram-positive bacteremia, not POA: Patient continues to have fever since 09/21/2020 with latest temperature spike of 100.9 at around 9 PM on 09/24/2020.  Finally, patient's fever has subsided now.  Leukocytosis stable..  S/p left-sided thoracentesis 09/22/2020 which seems to be exudative. Chest x-ray improved after that but repeat chest x-ray on the night of 09/23/2020 shows once again accumulation of the pleural effusion on the left side. CT chest obtained per IR recommendations which show some mild empyema on the left side.  Per IR, it is too small to need a thoracentesis or chest tube.  Patient's blood culture is also growing MRSE.  For this reason, ID consulted.  Antibiotics were transitioned from  Zosyn to cefepime and vancomycin added on 09/23/2020 and Flagyl discontinued..  Pleural fluid culture negative.  Per ID, no need for repeat thoracentesis.  Recommend continuing 7 days of antibiotics with end date of 09/29/2020.  Blood culture repeated which are negative thus far.  Patient has now started to improve.  Currently she was on 6 L of oxygen when I saw her but she was saturating 100%.  Patient's primary RN advised to wean her oxygen down.  Acute on chronic anemia: Likely due to acute illness.  Hemoglobin dropped to 7.0 on 09/22/2020, s/p 1 unit WBC.  Hemoglobin improved posttransfusion as expected and has remained stable.  Cecal wall thickening: CT abdomen shows cecal  wall thickening.  GI consulted.  Plan for colonoscopy once she is more stable.  Defer to GI for timing.  Hypokalemia: Resolved.   Essential hypertension: Blood pressure now fairly within  normal range.  Continue Toprol-XL.  Continue to hold rest of the antihypertensives which included Imdur, amlodipine.  Acute on chronic diastolic CHF with chronic hypoxic respiratory failure  Patient was out of her Lasix at home. It appears that she was taking 160 mg of Lasix daily.  We resumed her on Lasix 40 mg p.o. twice daily 2 days ago and I will continue that.  Her renal function is improving.   AKI on CKD IIIb/obstructive uropathy - SCr is 1.83 on admission, up from 1.51 in June 2022.  Creatinine remained stable around 1.2 but has started to rise since 09/21/2020 and currently 2.2.  Lasix was discontinued 09/21/2020.  Patient also having some obstructive uropathy.  Foley placed 3 days ago.  Will continue Lasix.  Hypocalcemia: Corrected calcium within normal range.   Type II DM with hyperglycemia Hemoglobin A1c was 6.2% in June 2022.  Blood sugar controlled on SSI.  Continue this.  Debility, deconditioning, ambulatory dysfunction. States that she has DME wheelchair at home for ambulation, PT initially recommended SNF.  Now she has improved and they are recommending CIR.  Patient declined SNF but now she is willing to consider CIR.  She wants some time to think about it.  I did inform her that we will discuss this issue tomorrow again.  Acute hyponatremia: Resolved.  Left thyroid nodule: 5.7 cm.  Check TSH.  Will need biopsy once she is more stable.  DVT prophylaxis: SCDs Start: 09/12/20 1951  Code Status: Full code  Family Communication: None  Status is: Inpatient  Remains inpatient appropriate because:IV treatments appropriate due to intensity of illness or inability to take PO and Inpatient level of care appropriate due to severity of illness  Dispo: The patient is from: Home              Anticipated d/c is to: Home               Patient currently is not medically stable to d/c.   Difficult to place patient No  Consultants: Medical oncology General  surgery ID GI  Procedures: As above  Anti-infectives:    Anti-infectives (From admission, onward)    Start     Dose/Rate Route Frequency Ordered Stop   09/25/20 1200  vancomycin (VANCOREADY) IVPB 1750 mg/350 mL        1,750 mg 175 mL/hr over 120 Minutes Intravenous Every 48 hours 09/23/20 1056 09/29/20 2359   09/23/20 1400  ceFEPIme (MAXIPIME) 2 g in sodium chloride 0.9 % 100 mL IVPB        2 g 200 mL/hr over 30 Minutes Intravenous Every 12 hours 09/23/20 1047 09/29/20 2359   09/23/20 1400  metroNIDAZOLE (FLAGYL) tablet 500 mg  Status:  Discontinued        500 mg Oral Every 12 hours 09/23/20 1047 09/24/20 1028   09/23/20 1200  vancomycin (VANCOREADY) IVPB 2000 mg/400 mL        2,000 mg 200 mL/hr over 120 Minutes Intravenous  Once 09/23/20 1047 09/23/20 1326   09/21/20 1400  piperacillin-tazobactam (ZOSYN) IVPB 3.375 g  Status:  Discontinued        3.375 g 12.5 mL/hr over 240 Minutes Intravenous Every 8 hours 09/21/20 1122 09/23/20 1047   09/18/20 1115  cefoTEtan (CEFOTAN) 2 g in sodium chloride 0.9 %  100 mL IVPB        2 g 200 mL/hr over 30 Minutes Intravenous  Once 09/18/20 1112 09/18/20 1250   09/18/20 1114  sodium chloride 0.9 % with cefoTEtan (CEFOTAN) ADS Med       Note to Pharmacy: Janene Harvey   : cabinet override      09/18/20 1114 09/18/20 1253      Subjective: Seen and examined.  She states that she is improving and feeling better today.  No new complaint.  No shortness of breath or abdominal pain. Objective: Vitals:   09/26/20 0400 09/26/20 0753  BP:    Pulse:    Resp:    Temp: 98.5 F (36.9 C) 98.9 F (37.2 C)  SpO2:      Intake/Output Summary (Last 24 hours) at 09/26/2020 0817 Last data filed at 09/25/2020 1410 Gross per 24 hour  Intake 480 ml  Output 900 ml  Net -420 ml    Filed Weights   09/24/20 0414 09/25/20 0500 09/26/20 0100  Weight: 112.1 kg 112.9 kg 109.6 kg   Body mass index is 37.84 kg/m.   Physical Exam:  General exam: Appears  calm and comfortable  Respiratory system: Diminished breath sounds at the bases bilaterally, also on the left middle lobe with some rhonchi at the left middle lobe respiratory effort normal. Cardiovascular system: S1 & S2 heard, RRR. No JVD, murmurs, rubs, gallops or clicks.  Trace pitting edema left lower extremity. Gastrointestinal system: Abdomen is nondistended, soft and nontender. No organomegaly or masses felt. Normal bowel sounds heard. Central nervous system: Alert and oriented. No focal neurological deficits. Extremities: Right BKA. Skin: No rashes, lesions or ulcers.  Psychiatry: Judgement and insight appear normal. Mood & affect appropriate.    Data Review: I have personally reviewed the following laboratory data and studies,  CBC: Recent Labs  Lab 09/22/20 2346 09/23/20 0939 09/23/20 2008 09/24/20 0342 09/25/20 0358 09/26/20 0227  WBC 21.5* 19.0* 17.9* 17.0* 14.2* 16.8*  NEUTROABS 16.1*  --  14.9* 12.6* 9.6* 11.8*  HGB 8.2* 8.6* 8.4* 9.2* 9.0* 8.1*  HCT 27.0* 29.0* 28.7* 31.9* 29.7* 27.5*  MCV 85.7 86.6 87.2 87.4 85.8 86.5  PLT 638* 637* 723* 757* 790* 746*    Basic Metabolic Panel: Recent Labs  Lab 09/22/20 0752 09/22/20 2346 09/23/20 0939 09/23/20 2008 09/24/20 0342 09/25/20 0358 09/26/20 0227  NA 129*   < > 134* 135 135 137 136  K 4.1   < > 4.4 3.9 3.9 3.6 3.5  CL 96*   < > 102 102 101 103 103  CO2 23   < > 21* 22 22 24 25   GLUCOSE 93   < > 124* 168* 122* 80 109*  BUN 46*   < > 45* 44* 41* 36* 32*  CREATININE 2.20*   < > 1.99* 2.14* 2.06* 1.71* 1.47*  CALCIUM 6.3*   < > 7.2* 7.1* 7.5* 7.9* 7.7*  MG 1.3*  --   --   --   --   --  1.7   < > = values in this interval not displayed.    Liver Function Tests: Recent Labs  Lab 09/22/20 2346 09/23/20 0939 09/23/20 2008 09/24/20 0342 09/25/20 0358  AST 23 33 28 24 17   ALT 16 15 16 16 13   ALKPHOS 67 69 75 83 74  BILITOT 0.4 0.6 0.5 0.6 0.7  PROT 5.8* 5.9* 6.2* 6.4* 6.0*  ALBUMIN 2.0* 2.0* 2.0* 2.2*  2.1*    Recent Labs  Lab 09/22/20 1825  AMYLASE 75    No results for input(s): AMMONIA in the last 168 hours. Cardiac Enzymes: No results for input(s): CKTOTAL, CKMB, CKMBINDEX, TROPONINI in the last 168 hours. BNP (last 3 results) Recent Labs    06/12/20 0954 09/12/20 1830 09/23/20 2008  BNP 774.5* 1,139.8* 1,080.6*     ProBNP (last 3 results) No results for input(s): PROBNP in the last 8760 hours.  CBG: Recent Labs  Lab 09/25/20 1624 09/25/20 1956 09/25/20 2347 09/26/20 0410 09/26/20 0752  GLUCAP 132* 143* 120* 139* 97    Recent Results (from the past 240 hour(s))  Culture, blood (routine x 2)     Status: Abnormal (Preliminary result)   Collection Time: 09/21/20 10:57 AM   Specimen: BLOOD  Result Value Ref Range Status   Specimen Description BLOOD SITE NOT SPECIFIED  Final   Special Requests   Final    BOTTLES DRAWN AEROBIC AND ANAEROBIC Blood Culture adequate volume   Culture  Setup Time   Final    AEROBIC BOTTLE ONLY GRAM POSITIVE COCCI IN CLUSTERS CRITICAL VALUE NOTED.  VALUE IS CONSISTENT WITH PREVIOUSLY REPORTED AND CALLED VALUE.    Culture (A)  Final    STAPHYLOCOCCUS EPIDERMIDIS SUSCEPTIBILITIES TO FOLLOW Performed at [facility], [address], [place], [place] [address]    Report Status PENDING  Incomplete  Culture, blood (routine x 2)     Status: Abnormal   Collection Time: 09/21/20 11:20 AM   Specimen: BLOOD RIGHT HAND  Result Value Ref Range Status   Specimen Description BLOOD RIGHT HAND  Final   Special Requests   Final    BOTTLES DRAWN AEROBIC ONLY Blood Culture adequate volume   Culture  Setup Time   Final    GRAM POSITIVE COCCI IN CLUSTERS AEROBIC BOTTLE ONLY CRITICAL RESULT CALLED TO, READ BACK BY AND VERIFIED WITHCeledonio Miyamoto [facility] 0932 09/22/20 A BROWNING Performed at [facility], [address] [place], [place] [address]    Culture STAPHYLOCOCCUS EPIDERMIDIS (A)  Final   Report Status 09/24/2020 FINAL  Final    Organism ID, Bacteria STAPHYLOCOCCUS EPIDERMIDIS  Final      Susceptibility   Staphylococcus epidermidis - MIC*    CIPROFLOXACIN <=0.5 SENSITIVE Sensitive     ERYTHROMYCIN >=8 RESISTANT Resistant     GENTAMICIN <=0.5 SENSITIVE Sensitive     OXACILLIN >=4 RESISTANT Resistant     TETRACYCLINE >=16 RESISTANT Resistant     VANCOMYCIN 2 SENSITIVE Sensitive     TRIMETH/SULFA <=10 SENSITIVE Sensitive     CLINDAMYCIN <=0.25 SENSITIVE Sensitive     RIFAMPIN <=0.5 SENSITIVE Sensitive     Inducible Clindamycin NEGATIVE Sensitive     * STAPHYLOCOCCUS EPIDERMIDIS  Blood Culture ID Panel (Reflexed)     Status: Abnormal   Collection Time: 09/21/20 11:20 AM  Result Value Ref Range Status   Enterococcus faecalis NOT DETECTED NOT DETECTED Final   Enterococcus Faecium NOT DETECTED NOT DETECTED Final   Listeria monocytogenes NOT DETECTED NOT DETECTED Final   Staphylococcus species DETECTED (A) NOT DETECTED Final    Comment: CRITICAL RESULT CALLED TO, READ BACK BY AND VERIFIED WITH: Celedonio Miyamoto PHARMD 1627 09/22/20 A BROWNING    Staphylococcus aureus (BCID) NOT DETECTED NOT DETECTED Final   Staphylococcus epidermidis DETECTED (A) NOT DETECTED Final    Comment: Methicillin (oxacillin) resistant coagulase negative staphylococcus. Possible blood culture contaminant (unless isolated from more than one blood culture draw or clinical case suggests pathogenicity). No antibiotic treatment  is indicated for blood  culture contaminants. CRITICAL RESULT CALLED TO, READ BACK BY AND VERIFIED WITH: Celedonio Miyamoto PHARMD 1627 09/22/20 A BROWNING    Staphylococcus lugdunensis NOT DETECTED NOT DETECTED Final   Streptococcus species NOT DETECTED NOT DETECTED Final   Streptococcus agalactiae NOT DETECTED NOT DETECTED Final   Streptococcus pneumoniae NOT DETECTED NOT DETECTED Final   Streptococcus pyogenes NOT DETECTED NOT DETECTED Final   A.calcoaceticus-baumannii NOT DETECTED NOT DETECTED Final   Bacteroides fragilis  NOT DETECTED NOT DETECTED Final   Enterobacterales NOT DETECTED NOT DETECTED Final   Enterobacter cloacae complex NOT DETECTED NOT DETECTED Final   Escherichia coli NOT DETECTED NOT DETECTED Final   Klebsiella aerogenes NOT DETECTED NOT DETECTED Final   Klebsiella oxytoca NOT DETECTED NOT DETECTED Final   Klebsiella pneumoniae NOT DETECTED NOT DETECTED Final   Proteus species NOT DETECTED NOT DETECTED Final   Salmonella species NOT DETECTED NOT DETECTED Final   Serratia marcescens NOT DETECTED NOT DETECTED Final   Haemophilus influenzae NOT DETECTED NOT DETECTED Final   Neisseria meningitidis NOT DETECTED NOT DETECTED Final   Pseudomonas aeruginosa NOT DETECTED NOT DETECTED Final   Stenotrophomonas maltophilia NOT DETECTED NOT DETECTED Final   Candida albicans NOT DETECTED NOT DETECTED Final   Candida auris NOT DETECTED NOT DETECTED Final   Candida glabrata NOT DETECTED NOT DETECTED Final   Candida krusei NOT DETECTED NOT DETECTED Final   Candida parapsilosis NOT DETECTED NOT DETECTED Final   Candida tropicalis NOT DETECTED NOT DETECTED Final   Cryptococcus neoformans/gattii NOT DETECTED NOT DETECTED Final   Methicillin resistance mecA/C DETECTED (A) NOT DETECTED Final    Comment: CRITICAL RESULT CALLED TO, READ BACK BY AND VERIFIED WITHCeledonio Miyamoto [facility] 1751 09/22/20 A BROWNING Performed at [facility], [address] [place], [place] [address]   Culture, body fluid w Gram Stain-bottle     Status: None (Preliminary result)   Collection Time: 09/22/20  4:51 PM   Specimen: Pleura  Result Value Ref Range Status   Specimen Description PLEURAL  Final   Special Requests NONE  Final   Culture   Final    NO GROWTH 3 DAYS Performed at [facility], [address], [place], [place] [address]    Report Status PENDING  Incomplete  Gram stain     Status: None   Collection Time: 09/22/20  4:51 PM   Specimen: Pleura  Result Value Ref Range Status   Specimen Description  PLEURAL  Final   Special Requests NONE  Final   Gram Stain   Final    ABUNDANT WBC PRESENT, PREDOMINANTLY PMN NO ORGANISMS SEEN Performed at [facility], [address], [place], [place] [address]    Report Status 09/22/2020 FINAL  Final  Culture, blood (routine x 2)     Status: None (Preliminary result)   Collection Time: 09/24/20  9:55 AM   Specimen: BLOOD LEFT HAND  Result Value Ref Range Status   Specimen Description BLOOD LEFT HAND  Final   Special Requests   Final    BOTTLES DRAWN AEROBIC AND ANAEROBIC Blood Culture adequate volume   Culture   Final    NO GROWTH 1 DAY Performed at [facility], [address], [place], [place] [address]    Report Status PENDING  Incomplete  Culture, blood (routine x 2)     Status: None (Preliminary result)   Collection Time: 09/24/20 10:05 AM   Specimen: BLOOD RIGHT HAND  Result Value Ref Range Status   Specimen Description BLOOD RIGHT HAND  Final   Special Requests   Final    BOTTLES DRAWN AEROBIC AND ANAEROBIC Blood Culture adequate volume   Culture   Final    NO GROWTH 1 DAY Performed at [facility], [address], [place], [place] [address]    Report Status PENDING  Incomplete      Studies: CT CHEST WO CONTRAST  Result Date: 09/24/2020 CLINICAL DATA:  55 year old female with history of empyema. EXAM: CT CHEST WITHOUT CONTRAST TECHNIQUE: Multidetector CT imaging of the chest was performed following the standard protocol without IV contrast. COMPARISON:  Chest CT 02/13/2019. FINDINGS: Cardiovascular: Heart size is mildly enlarged. There is no significant pericardial fluid, thickening or pericardial calcification. Aortic atherosclerosis. No definite coronary artery calcifications. Mediastinum/Nodes: No pathologically enlarged mediastinal or hilar lymph nodes. Please note that accurate exclusion of hilar adenopathy is limited on noncontrast CT scans. Esophagus is unremarkable in appearance. No axillary lymphadenopathy.  Increasing asymmetric mass-like enlargement of the left lobe of the thyroid gland which is heterogeneous in appearance measuring 5.4 x 4.9 cm (axial image 4 of series 3) on today's examination as compared with 4.6 x 4.4 cm on prior study 02/13/2019. Lungs/Pleura: Trace right pleural effusion lying dependently. Moderate left pleural effusion which appears partially loculated, predominantly in the region of the major fissure. Areas of passive atelectasis are noted in the adjacent lung, including the left lower lobe which is nearly completely collapsed. Some degree of airspace consolidation is also suspected in the basal segments of the left lower lobe, poorly evaluated on today's noncontrast CT examination. Upper Abdomen: Postoperative changes of recent splenectomy with surgical drain in the upper left retroperitoneum and a small amount of residual postoperative fluid in the surrounding tissues. Musculoskeletal: There are no aggressive appearing lytic or blastic lesions noted in the visualized portions of the skeleton. IMPRESSION: 1. Complex left pleural effusion which is partially loculated, as above, with associated areas of atelectasis in the lungs, and probable airspace consolidation the basal segments of the left lower lobe concerning for pneumonia. 2. Mild cardiomegaly. 3. Asymmetric masslike enlargement of the left lobe of the thyroid gland which is increased compared to the prior study. Recommend thyroid US (ref: J Am Coll Radiol. 2015 Feb;12(2): 143-50). 4. Additional incidental findings, as above. 5. Aortic atherosclerosis. Aortic Atherosclerosis (ICD10-I70.0). Electronically Signed   By: Trudie Reed M.D.   On: 09/24/2020 14:23   US THYROID  Result Date: 09/25/2020 CLINICAL DATA:  Left thyroid mass by chest CT EXAM: THYROID ULTRASOUND TECHNIQUE: Ultrasound examination of the thyroid gland and adjacent soft tissues was performed. COMPARISON:  09/24/2020, 03/08/2017 FINDINGS: Parenchymal Echotexture:  Normal Isthmus: 8 mm Right lobe: 5.3 x 1.9 x 1.8 cm Left lobe: 6.9 x 5.5 x 4.7 cm _________________________________________________________ Estimated total number of nodules >/= 1 cm: 1 Number of spongiform nodules >/=  2 cm not described below (TR1): 0 Number of mixed cystic and solid nodules >/= 1.5 cm not described below (TR2): 0 _________________________________________________________ Nodule # 1: Location: Left; Mid Maximum size: 5.7, previously 4.3 cm; Other 2 dimensions: 5.3 x 4.0 cm Composition: solid/almost completely solid (2) Echogenicity: isoechoic (1) Shape: not taller-than-wide (0) Margins: ill-defined (0) Echogenic foci: none (0) ACR TI-RADS total points: 3. ACR TI-RADS risk category: TR3 (3 points). ACR TI-RADS recommendations: **Given size (>/= 2.5 cm) and appearance, fine needle aspiration of this mildly suspicious nodule should be considered based on TI-RADS criteria. _________________________________________________________ Additional benign stable  subcentimeter hypoechoic nodules in the right thyroid all measuring 7 mm or less. No hypervascularity.  No regional adenopathy. IMPRESSION: Slightly larger 5.7 cm left mid thyroid TR 3 nodule continues to meet criteria for biopsy as above. The above is in keeping with the ACR TI-RADS recommendations - J Am Coll Radiol 2017;14:587-595. Electronically Signed   By: Judie Petit.  Shick M.D.   On: 09/25/2020 11:20     Total time spent in minutes: 30 Hughie Closs, MD  [facility] 09/26/2020  If 7PM-7AM, please contact night-coverage

## 2020-09-26 NOTE — PMR Pre-admission (Signed)
PMR Admission Coordinator Pre-Admission Assessment  Patient: [name] is an 55 y.o., female MRN: [id number] DOB: 07/26/1965 Height: '5\' 7"'  (170.2 cm) Weight: 107 kg  Insurance Information HMO:     PPO:      PCP:      IPA:      80/20:      OTHER:  PRIMARY: Blue Medicare      Policy#: [id number]      Subscriber: pt CM Name: Larene Beach      Phone#: [phone]     Fax#: [phone] Pre-Cert#: TBD approved for 7 days       Employer:  Benefits:  Phone #: [phone]     Name: 9/23 Eff. Date: 01/05/2020     Deduct: none      Out of Pocket Max: $4200      Life Max: none CIR: $335 co pay per day days 1 until 6      SNF: no  copay days 1 until 20; $188 co apy per day days 21 until 60; no copay days 61 until 100 Outpatient: $40 per visit     Co-Pay: visits per medcial neccesity Home Health: 100%      Co-Pay: visits eper medcial neccesity DME: 80%     Co-Pay: 20% Providers: in network  SECONDARY:       Policy#:      Phone#:   Development worker, community:       Phone#:   The Engineer, [employer]" for patients in Inpatient Rehabilitation Facilities with attached "Privacy Act [place] Records" was provided and verbally reviewed with: Patient  Emergency Contact Information Contact Information     Name Relation Home Work Mobile   Glass,Patsy Mother [phone]  [phone]       Current Medical History  Patient Admitting Diagnosis: Debility  History of Present Illness: 55 year old female with medical history significant for ITP, type 2 DM, CKD, CHF, chronic hypoxic respiratory failure who presents on 09/12/2020 with SOB and bruising. Progressive over past month and had run out of lasix for one month. Hgb 4.5, platelets less than 5, WBC 2.8. CXR showed cardiomegaly with small pleural effusion and pulmonary edema. Treated in ED with IV lasix, protonix, dexamethasone and Lokelma. Transfused  PRBCs.  Presented with platelet of < 5. Patient received IV steroids,  received Nplate and IVIG.    Received PRBC during hospitalization.Received Aranesp and Feraheme . Continue folic acid.  Patient has received pneumococcal meningococcal and haemophilus vaccination.  Oncology recommended splenectomy. Patient underwent splenectomy on 09/18/2020.  Platelets rising postoperatively as expected.  Due to persistent fever, CT abdomen and pelvis was repeated 09/23/2020 which showed regular expected postop changes however also showed irregular cecal wall thickening.  Abdominal pain and tenderness improving.  Management per general surgery.  Patient's fever has subsided now.  Leukocytosis stable..  S/p left-sided thoracentesis 09/22/2020 which seems to be exudative. Chest x-ray improved after that but repeat chest x-ray on the night of 09/23/2020 shows once again accumulation of the pleural effusion on the left side. CT chest obtained per IR recommendations which show some mild empyema on the left side.  Per IR, it is too small to need a thoracentesis or chest tube.  Patient's blood culture is also growing MRSE.  For this reason, ID consulted.  Antibiotics were transitioned from  Zosyn to cefepime and vancomycin added on 09/23/2020 and Flagyl discontinued..  Pleural fluid culture negative.  Per ID, no need for repeat thoracentesis.  Completed 7 days of IV  Vanc and Cefepime and started 3 weeks of Augmentin which will end 10/17. .CT abdomen shows cecal wall thickening.  GI consulted.  Plan for colonoscopy as outpatient.  Continue Toprol-XL.  Continue to hold rest of the antihypertensives which included Imdur, amlodipine.Patient was out of her Lasix at home. It appears that she was taking 160 mg of Lasix daily.  We resumed her on Lasix 40 mg p.o. twice daily .  Her renal function is improving.SCr is 1.83 on admission, up from 1.51 in June 2022.    Patient's medical record from [facility] has been reviewed by the rehabilitation admission coordinator and physician.  Past Medical History   Past Medical History:  Diagnosis Date   Acute exacerbation of CHF (congestive heart failure) ([place]) 04/11/2019   Anemia    Arthritis    "spine, hands" (11/25/2015)   Back pain    "all my back; probably 3 times/week" (11/25/2015)   Chronic indwelling Foley catheter 03/04/2017   Diabetic foot ulcer associated with type 2 diabetes mellitus ([place])    Hypertension    Intracerebral hemorrhage ([place]) As a teenager    States she had burst blood vessel as teenager, now with resultant minor visual field and hearing deficits   Neuropathy    Stroke ([place]) ~ 1982   "my feeling on my RLE, right lower eye vision,  & hearing out of my right ear not the same since" (11/25/2015)   Type II diabetes mellitus ([place])    Has the patient had major surgery during 100 days prior to admission? Yes  Family History   family history includes Cancer in her father; Diabetes in her brother, father, mother, and son; Hyperlipidemia in her father and mother; Hypertension in her father and mother; Peripheral Artery Disease in her brother.  Current Medications  Current Facility-Administered Medications:    acetaminophen (TYLENOL) tablet 650 mg, 650 mg, Oral, Q4H PRN, Darliss Cheney, MD, 650 mg at 09/24/20 2111   amoxicillin-clavulanate (AUGMENTIN) 875-125 MG per tablet 1 tablet, 1 tablet, Oral, Q12H, Gonfa, Taye T, MD, 1 tablet at 10/02/20 0858   bethanechol (URECHOLINE) tablet 10 mg, 10 mg, Oral, TID, Maczis, Barth Kirks, PA-C, 10 mg at 10/02/20 0901   enoxaparin (LOVENOX) injection 50 mg, 50 mg, Subcutaneous, Q24H, Bertis Ruddy, RPH, 50 mg at 10/01/20 1304   fentaNYL (SUBLIMAZE) injection 25 mcg, 25 mcg, Intravenous, Q5 min PRN, Maryjane Hurter, MD   folic acid (FOLVITE) tablet 2 mg, 2 mg, Oral, Daily, Ennever, Rudell Cobb, MD, 2 mg at 10/02/20 0857   furosemide (LASIX) tablet 40 mg, 40 mg, Oral, BID, Pahwani, Ravi, MD, 40 mg at 10/02/20 0857   gabapentin (NEURONTIN) capsule 400 mg, 400 mg, Oral, BID, Pokhrel, Laxman, MD,  400 mg at 10/02/20 0857   HYDROmorphone (DILAUDID) injection 0.5-1 mg, 0.5-1 mg, Intravenous, Q3H PRN, Meuth, Brooke A, PA-C, 0.5 mg at 09/19/20 0931   insulin aspart (novoLOG) injection 0-15 Units, 0-15 Units, Subcutaneous, Q4H, Pahwani, Ravi, MD, 3 Units at 10/01/20 2111   isosorbide mononitrate (IMDUR) 24 hr tablet 30 mg, 30 mg, Oral, Daily, Gonfa, Taye T, MD, 30 mg at 10/02/20 0859   loperamide (IMODIUM) capsule 2 mg, 2 mg, Oral, PRN, Pokhrel, Laxman, MD, 2 mg at 09/17/20 2241   metoprolol succinate (TOPROL-XL) 24 hr tablet 50 mg, 50 mg, Oral, Daily, Gonfa, Taye T, MD, 50 mg at 10/02/20 0857   ondansetron (ZOFRAN) injection 4 mg, 4 mg, Intravenous, Q6H PRN, Opyd, Ilene Qua, MD, 4 mg at 09/28/20 [phone]  oxyCODONE (Oxy IR/ROXICODONE) immediate release tablet 5-10 mg, 5-10 mg, Oral, Q4H PRN, Meuth, Brooke A, PA-C, 10 mg at 09/26/20 2210   pantoprazole (PROTONIX) EC tablet 40 mg, 40 mg, Oral, Daily, Pokhrel, Laxman, MD, 40 mg at 10/02/20 0858   polyvinyl alcohol (LIQUIFILM TEARS) 1.4 % ophthalmic solution 1 drop, 1 drop, Both Eyes, PRN, Pokhrel, Laxman, MD   rosuvastatin (CRESTOR) tablet 20 mg, 20 mg, Oral, Daily, Pokhrel, Laxman, MD, 20 mg at 10/02/20 0858  Patients Current Diet:  Diet Order             Diet - low sodium heart healthy           Diet Carb Modified           Diet Carb Modified Fluid consistency: Thin; Room service appropriate? Yes  Diet effective now                  Precautions / Restrictions Precautions Precautions: Fall, Other (comment) Precaution Comments: L abdominal JP drain; R BKA (unable to don prosthesis due to swelling) Restrictions Weight Bearing Restrictions: No   Has the patient had 2 or more falls or a fall with injury in the past year? No  Prior Activity Level Limited Community (1-2x/wk): Mod I at wheelchair level  Prior Functional Level Self Care: Did the patient need help bathing, dressing, using the toilet or eating? Independent  Indoor  Mobility: Did the patient need assistance with walking from room to room (with or without device)? Needed some help  Stairs: Did the patient need assistance with internal or external stairs (with or without device)? Needed some help  Functional Cognition: Did the patient need help planning regular tasks such as shopping or remembering to take medications? Independent  Patient Information Are you of Hispanic, Latino/a,or Spanish origin?: A. No, not of Hispanic, Latino/a, or Spanish origin What is your race?: B. Black or African American Do you need or want an interpreter to communicate with a doctor or health care staff?: 0. No  Patient's Response To:  Health Literacy and Transportation Is the patient able to respond to health literacy and transportation needs?: Yes Health Literacy - How often do you need to have someone help you when you read instructions, pamphlets, or other written material from your doctor or pharmacy?: Never In the past 12 months, has lack of transportation kept you from medical appointments or from getting medications?: No In the past 12 months, has lack of transportation kept you from meetings, work, or from getting things needed for daily living?: No  Development worker, international aid / [place] Devices/Equipment: Wheelchair, Oxygen, CBG Meter, Bedside commode/3-in-1 Home Equipment: Bedside commode, Environmental consultant - 2 wheels, Wheelchair - manual, Hospital bed  Prior Device Use: Indicate devices/aids used by the patient prior to current illness, exacerbation or injury? Manual wheelchair and Orthotics/Prosthetics  Current Functional Level Cognition  Overall Cognitive Status: Impaired/Different from baseline Current Attention Level: Sustained Orientation Level: Oriented X4 Following Commands: Follows one step commands with increased time, Follows one step commands consistently, Follows multi-step commands inconsistently Safety/Judgement: Decreased awareness of safety,  Decreased awareness of deficits General Comments: Pt able to maintain focus well, but needs repeated instructions and cues to perform tasks, even after performing them several minutes prior. Pt benefits from simple one-step commands, having difficulty following multi-step commands.    Extremity Assessment (includes Sensation/Coordination)  Upper Extremity Assessment: Defer to OT evaluation  Lower Extremity Assessment: Generalized weakness, RLE deficits/detail, LLE deficits/detail RLE Deficits / Details: R  BKA hx, reports sensation deficits distally RLE Sensation: decreased light touch LLE Deficits / Details: Weakness noted with functional mobility LLE Sensation: history of peripheral neuropathy LLE Coordination: decreased gross motor    ADLs  Overall ADL's : Needs assistance/impaired Eating/Feeding: Independent, Sitting Grooming: Wash/dry hands, Wash/dry face, Set up, Sitting, Oral care Upper Body Bathing: Supervision/ safety, Sitting Lower Body Bathing: Minimal assistance, Bed level Upper Body Dressing : Set up, Sitting Lower Body Dressing: Moderate assistance, Sitting/lateral leans Toilet Transfer: Moderate assistance, Transfer board, +2 for safety/equipment, Requires wide/bariatric, Requires drop arm Functional mobility during ADLs: Moderate assistance, +2 for physical assistance General ADL Comments: +2 from lower surface    Mobility  Overal bed mobility: Needs Assistance Bed Mobility: Supine to Sit, Sit to Supine Rolling: Min assist Supine to sit: Supervision, HOB elevated Sit to supine: Supervision, HOB elevated General bed mobility comments: Increased time and effort, use of bed rails    Transfers  Overall transfer level: Needs assistance Equipment used:  (posterior aspect of recliner) Transfers: Sit to/from Stand, Lateral/Scoot Transfers Sit to Stand: From elevated surface, Mod assist Stand pivot transfers: Max assist, +2 physical assistance, +2 safety/equipment Squat  pivot transfers: Mod assist Anterior-Posterior transfers: Min assist, From elevated surface  Lateral/Scoot Transfers: Min guard General transfer comment: Practiced sit <> stand from elevated EOB pulling up on posterior aspect of recliner, cuing pt to scoot anteriorly, rock anteriorly to gain momentum, and activate quads, encouraging increased buttocks clearance as reps progressed, x12 reps with modA to clear buttocks a couple inches but not obtaining full stand position each rep. Extra time and occasional blocking of L foot with lateral scoot to L towards HOB, x5 reps.    Ambulation / Gait / Stairs / Wheelchair Mobility  Ambulation/Gait General Gait Details: Unable    Posture / Balance Dynamic Sitting Balance Sitting balance - Comments: Dynamic sitting EOB without LOB, supervision for safety. Balance Overall balance assessment: Needs assistance Sitting-balance support: No upper extremity supported, Feet supported Sitting balance-Leahy Scale: Good Sitting balance - Comments: Dynamic sitting EOB without LOB, supervision for safety. Standing balance support: Bilateral upper extremity supported Standing balance-Leahy Scale: Zero Standing balance comment: Unable to come to full upright standing from EOB today, focused on clearing buttocks only with modA.    Special needs/care consideration O 2 at 2 liters Simpson Prosthetic to be brought in by family JP drain to be removed per patient on 9/29   Previous Home Environment  Living Arrangements:  (64 year old son, Mom in [place] and visits frequently)  Lives With: Son Available Help at Discharge: Family, Available PRN/intermittently Type of Home: House Home Layout: One level Home Access: Stairs to enter Entrance Stairs-Rails: None Entrance Stairs-Number of Steps: 1 Bathroom Shower/Tub: Other (comment) Bathroom Toilet: Handicapped height Bathroom Accessibility: Yes Home Care Services: No  Discharge Living Setting Plans for Discharge Living  Setting: Patient's home, Lives with (comment) (55 year old son) Type of Home at Discharge: House Discharge Home Layout: One level Discharge Home Access: Stairs to enter Entrance Stairs-Rails: None Entrance Stairs-Number of Steps: 1 Discharge Bathroom Shower/Tub: Other (comment) (bathes w/c level at sink) Discharge Bathroom Toilet: Handicapped height Discharge Bathroom Accessibility: Yes How Accessible: Accessible via wheelchair Does the patient have any problems obtaining your medications?: No  Social/Family/Support Systems Patient Roles: Parent Contact Information: Mom Anticipated Caregiver: son Anticipated Caregiver's Contact Information: see above Ability/Limitations of Caregiver: Mom lives in [place] Caregiver Availability: Intermittent Discharge Plan Discussed with Primary Caregiver: Yes Is Caregiver In Agreement with Plan?:  Yes Does Caregiver/Family have Issues with Lodging/Transportation while Pt is in Rehab?: No  Goals Patient/Family Goal for Rehab: Mod I to intermittent supervision at wheelchair level for PT and OT Expected length of stay: ELOS 10 to 14 days Pt/Family Agrees to Admission and willing to participate: Yes Program Orientation Provided & Reviewed with Pt/Caregiver Including Roles  & Responsibilities: Yes  Decrease burden of Care through IP rehab admission: n/a  Possible need for SNF placement upon discharge: not anticipated  Patient Condition: I have reviewed medical records from [facility], spoken with CM, and patient, son, and family member. I met with patient at the bedside for inpatient rehabilitation assessment.  Patient will benefit from ongoing PT and OT, can actively participate in 3 hours of therapy a day 5 days of the week, and can make measurable gains during the admission.  Patient will also benefit from the coordinated team approach during an Inpatient Acute Rehabilitation admission.  The patient will receive intensive therapy as well as  Rehabilitation physician, nursing, social worker, and care management interventions.  Due to bladder management, bowel management, safety, skin/wound care, disease management, medication administration, pain management, and patient education the patient requires 24 hour a day rehabilitation nursing.  The patient is currently min to mod assist overall with mobility and basic ADLs.  Discharge setting and therapy post discharge at home with home health is anticipated.  Patient has agreed to participate in the Acute Inpatient Rehabilitation Program and will admit today.  Preadmission Screen Completed By:  Cleatrice Burke, 10/02/2020 11:34 AM ______________________________________________________________________   Discussed status with Dr. Dagoberto Ligas on  10/02/2020 at  1132 and received approval for admission today.  Admission Coordinator:  Cleatrice Burke, RN, time  0768 Date  10/02/2020   Assessment/Plan: Diagnosis: Does the need for close, 24 hr/day Medical supervision in concert with the patient's rehab needs make it unreasonable for this patient to be served in a less intensive setting? Yes Co-Morbidities requiring supervision/potential complications: ITP s/p splenectomy, old BKA, DM, CHF, CKD  Due to bladder management, bowel management, safety, skin/wound care, disease management, medication administration, pain management, and patient education, does the patient require 24 hr/day rehab nursing? Yes Does the patient require coordinated care of a physician, rehab nurse, PT, OT, and SLP to address physical and functional deficits in the context of the above medical diagnosis(es)? Yes Addressing deficits in the following areas: balance, endurance, locomotion, strength, transferring, bowel/bladder control, bathing, dressing, feeding, grooming, and toileting Can the patient actively participate in an intensive therapy program of at least 3 hrs of therapy 5 days a week? Yes The potential for  patient to make measurable gains while on inpatient rehab is good Anticipated functional outcomes upon discharge from inpatient rehab: modified independent and supervision PT, modified independent and supervision OT, n/a SLP Estimated rehab length of stay to reach the above functional goals is: 10-14 days Anticipated discharge destination: Home 10. Overall Rehab/Functional Prognosis: good   MD Signature:

## 2020-09-26 NOTE — Progress Notes (Signed)
Inpatient Rehabilitation Admissions Coordinator   I met at bedside with patient and she is in agreement to pursue Cir admit. I will begin Auth with [facility] Medicare for possible admit next week pending their approval.  Danne Baxter, RN, MSN Rehab Admissions Coordinator [phone] 09/26/2020 12:58 PM

## 2020-09-26 NOTE — Progress Notes (Signed)
Occupational Therapy Treatment Patient Details Name: [name] MRN: [id number] DOB: 1965-10-14 Today's Date: 09/26/2020   History of present illness Pt is a 55 y.o. female admitted 09/12/20 with SOB, fatigue, bruising. Workup for symptomatic anemia, idiopathic thrombocytopenic purpura. 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 CXR 9/18 with concern for bilateral PNA, large L pleural effusion. S/p thoracentesis 9/19. PMH includes R BKA (2013), CHF, DM2, HTN, stroke, ICH, arthritis, neuropathy, chronic indwelling catheter.   OT comments  Patient continues to participate and make fair progress toward patient focused goals.  Her bed mobility has improved to a supervised level, but she is needing increased help for squat pivot transfers, needing up to Max A of 2.  Her ADL status remains largely at a bed level, but she is able to groom seated with supervision.  The patient believes she is getting weaker while in the hospital, and is motivated to get to rehab.  She has a strong desire to regain her independence, and would benefit from a higher intensity, multi disciplined post acute rehab approach to maximize her functional status, reduce her caregiver burden, and return home.  OT will continue to follow in the acute setting.  Encouraged patient to advocate for herself, and request out of bed for meals.     Recommendations for follow up therapy are one component of a multi-disciplinary discharge planning process, led by the attending physician.  Recommendations may be updated based on patient status, additional functional criteria and insurance authorization.    Follow Up Recommendations  CIR    Equipment Recommendations  3 in 1 bedside commode;Tub/shower bench    Recommendations for Other Services      Precautions / Restrictions Precautions Precautions: Fall;Other (comment) Restrictions Weight Bearing Restrictions: No       Mobility Bed Mobility Overal bed mobility: Needs Assistance Bed  Mobility: Supine to Sit;Sit to Supine     Supine to sit: Supervision;HOB elevated Sit to supine: Supervision        Transfers Overall transfer level: Needs assistance   Transfers: Lateral/Scoot Transfers          Lateral/Scoot Transfers: +2 safety/equipment;With slide board;Mod assist      Balance Overall balance assessment: Needs assistance Sitting-balance support: No upper extremity supported Sitting balance-Leahy Scale: Fair                                     ADL either performed or assessed with clinical judgement   ADL       Grooming: Wash/dry hands;Wash/dry face;Set up;Sitting;Oral care               Lower Body Dressing: Moderate assistance;Sitting/lateral leans   Toilet Transfer: Moderate assistance;Transfer board;+2 for safety/equipment;Requires wide/bariatric;Requires drop arm           Functional mobility during ADLs: Moderate assistance;+2 for physical assistance                         Cognition Arousal/Alertness: Awake/alert Behavior During Therapy: WFL for tasks assessed/performed                         Memory: Decreased short-term memory Following Commands: Follows one step commands inconsistently;Follows one step commands with increased time Safety/Judgement: Decreased awareness of safety;Decreased awareness of deficits  Pertinent Vitals/ Pain       Pain Assessment: No/denies pain Pain Intervention(s): Monitored during session                                                          Frequency  Min 2X/week        Progress Toward Goals  OT Goals(current goals can now be found in the care plan section)  Progress towards OT goals: Progressing toward goals  Acute Rehab OT Goals Patient Stated Goal: to go to CIR to get better and go home OT Goal Formulation: With patient Time For Goal Achievement: 10/03/20 Potential to  Achieve Goals: Good  Plan Discharge plan needs to be updated    Co-evaluation                 AM-PAC OT "6 Clicks" Daily Activity     Outcome Measure   Help from another person eating meals?: None Help from another person taking care of personal grooming?: None Help from another person toileting, which includes using toliet, bedpan, or urinal?: A Lot Help from another person bathing (including washing, rinsing, drying)?: A Lot Help from another person to put on and taking off regular upper body clothing?: A Little Help from another person to put on and taking off regular lower body clothing?: A Lot 6 Click Score: 17    End of Session Equipment Utilized During Treatment: Oxygen  OT Visit Diagnosis: Unsteadiness on feet (R26.81);Muscle weakness (generalized) (M62.81);Other symptoms and signs involving cognitive function   Activity Tolerance Patient limited by fatigue   Patient Left with call bell/phone within reach;in bed   Nurse Communication Mobility status        Time: 1540-1553 OT Time Calculation (min): 13 min  Charges: OT General Charges $OT Visit: 1 Visit OT Treatments $Self Care/Home Management : 8-22 mins  09/26/2020  RP, OTR/L  Acute Rehabilitation Services  Office:  [phone]   Suzanna Obey 09/26/2020, 4:08 PM

## 2020-09-26 NOTE — Progress Notes (Signed)
8 Days Post-Op  Subjective: CC: Patient with some epigastric soreness and pain around her "belt line". She reports she ate a burger for dinner yesterday without n/v. Feel congested this am and can't taste anything along with nausea so did not want to eat this am. She continues to pass flatus and had a bm yesterday. Working with therapies. Foley still in place.   Objective: Vital signs in last 24 hours: Temp:  [98.5 F (36.9 C)-99.3 F (37.4 C)] 98.9 F (37.2 C) (09/23 0753) Pulse Rate:  [75-81] 78 (09/23 0829) Resp:  [17-22] 17 (09/22 2347) BP: (144-149)/(57-65) 144/60 (09/23 0829) SpO2:  [95 %-98 %] 95 % (09/22 2347) Weight:  [109.6 kg] 109.6 kg (09/23 0100) Last BM Date: 09/25/20  Intake/Output from previous day: 09/22 0701 - 09/23 0700 In: 480 [P.O.:480] Out: 2400 [Urine:2400] Intake/Output this shift: No intake/output data recorded.  PE: Gen:  Alert, NAD, pleasant Pulm:  On o2. Normal rate and effort  Abd: Soft, mild upper abdominal distension, mild tenderness of the epigastrium over incision with some firmness right lateral upper incision (suspect small hematoma) without signs of cellulitis or infection. Otherwise NT, +BS, incisions C/D/I without erythema or drainage, drain with scant, thin bubbly tan output  Ext:  R BKA. Psych: A&Ox3  Skin: no rashes noted, warm and dry  Lab Results:  Recent Labs    09/25/20 0358 09/26/20 0227  WBC 14.2* 16.8*  HGB 9.0* 8.1*  HCT 29.7* 27.5*  PLT 790* 746*   BMET Recent Labs    09/25/20 0358 09/26/20 0227  NA 137 136  K 3.6 3.5  CL 103 103  CO2 24 25  GLUCOSE 80 109*  BUN 36* 32*  CREATININE 1.71* 1.47*  CALCIUM 7.9* 7.7*   PT/INR Recent Labs    09/24/20 1035  LABPROT 15.8*  INR 1.3*   CMP     Component Value Date/Time   NA 136 09/26/2020 0227   NA 139 05/16/2019 1522   NA 138 09/30/2015 1118   K 3.5 09/26/2020 0227   K 4.6 09/30/2015 1118   CL 103 09/26/2020 0227   CO2 25 09/26/2020 0227   CO2 19  (L) 09/30/2015 1118   GLUCOSE 109 (H) 09/26/2020 0227   GLUCOSE 200 (H) 09/30/2015 1118   BUN 32 (H) 09/26/2020 0227   BUN 23 05/16/2019 1522   BUN 22.3 09/30/2015 1118   CREATININE 1.47 (H) 09/26/2020 0227   CREATININE 2.63 (H) 06/12/2020 0755   CREATININE 1.3 (H) 09/30/2015 1118   CALCIUM 7.7 (L) 09/26/2020 0227   CALCIUM 9.6 09/30/2015 1118   PROT 6.0 (L) 09/25/2020 0358   PROT 8.2 05/16/2019 1522   ALBUMIN 2.1 (L) 09/25/2020 0358   ALBUMIN 4.2 05/16/2019 1522   AST 17 09/25/2020 0358   AST 15 06/12/2020 0755   ALT 13 09/25/2020 0358   ALT 9 06/12/2020 0755   ALKPHOS 74 09/25/2020 0358   BILITOT 0.7 09/25/2020 0358   BILITOT 0.3 06/12/2020 0755   GFRNONAA 42 (L) 09/26/2020 0227   GFRNONAA 21 (L) 06/12/2020 0755   GFRAA 59 (L) 09/19/2019 1449   Lipase     Component Value Date/Time   LIPASE 23 06/26/2019 0124    Studies/Results: CT CHEST WO CONTRAST  Result Date: 09/24/2020 CLINICAL DATA:  55 year old female with history of empyema. EXAM: CT CHEST WITHOUT CONTRAST TECHNIQUE: Multidetector CT imaging of the chest was performed following the standard protocol without IV contrast. COMPARISON:  Chest CT 02/13/2019. FINDINGS: Cardiovascular: Heart size  is mildly enlarged. There is no significant pericardial fluid, thickening or pericardial calcification. Aortic atherosclerosis. No definite coronary artery calcifications. Mediastinum/Nodes: No pathologically enlarged mediastinal or hilar lymph nodes. Please note that accurate exclusion of hilar adenopathy is limited on noncontrast CT scans. Esophagus is unremarkable in appearance. No axillary lymphadenopathy. Increasing asymmetric mass-like enlargement of the left lobe of the thyroid gland which is heterogeneous in appearance measuring 5.4 x 4.9 cm (axial image 4 of series 3) on today's examination as compared with 4.6 x 4.4 cm on prior study 02/13/2019. Lungs/Pleura: Trace right pleural effusion lying dependently. Moderate left pleural  effusion which appears partially loculated, predominantly in the region of the major fissure. Areas of passive atelectasis are noted in the adjacent lung, including the left lower lobe which is nearly completely collapsed. Some degree of airspace consolidation is also suspected in the basal segments of the left lower lobe, poorly evaluated on today's noncontrast CT examination. Upper Abdomen: Postoperative changes of recent splenectomy with surgical drain in the upper left retroperitoneum and a small amount of residual postoperative fluid in the surrounding tissues. Musculoskeletal: There are no aggressive appearing lytic or blastic lesions noted in the visualized portions of the skeleton. IMPRESSION: 1. Complex left pleural effusion which is partially loculated, as above, with associated areas of atelectasis in the lungs, and probable airspace consolidation the basal segments of the left lower lobe concerning for pneumonia. 2. Mild cardiomegaly. 3. Asymmetric masslike enlargement of the left lobe of the thyroid gland which is increased compared to the prior study. Recommend thyroid US (ref: J Am Coll Radiol. 2015 Feb;12(2): 143-50). 4. Additional incidental findings, as above. 5. Aortic atherosclerosis. Aortic Atherosclerosis (ICD10-I70.0). Electronically Signed   By: Trudie Reed M.D.   On: 09/24/2020 14:23   US THYROID  Result Date: 09/25/2020 CLINICAL DATA:  Left thyroid mass by chest CT EXAM: THYROID ULTRASOUND TECHNIQUE: Ultrasound examination of the thyroid gland and adjacent soft tissues was performed. COMPARISON:  09/24/2020, 03/08/2017 FINDINGS: Parenchymal Echotexture: Normal Isthmus: 8 mm Right lobe: 5.3 x 1.9 x 1.8 cm Left lobe: 6.9 x 5.5 x 4.7 cm _________________________________________________________ Estimated total number of nodules >/= 1 cm: 1 Number of spongiform nodules >/=  2 cm not described below (TR1): 0 Number of mixed cystic and solid nodules >/= 1.5 cm not described below (TR2): 0  _________________________________________________________ Nodule # 1: Location: Left; Mid Maximum size: 5.7, previously 4.3 cm; Other 2 dimensions: 5.3 x 4.0 cm Composition: solid/almost completely solid (2) Echogenicity: isoechoic (1) Shape: not taller-than-wide (0) Margins: ill-defined (0) Echogenic foci: none (0) ACR TI-RADS total points: 3. ACR TI-RADS risk category: TR3 (3 points). ACR TI-RADS recommendations: **Given size (>/= 2.5 cm) and appearance, fine needle aspiration of this mildly suspicious nodule should be considered based on TI-RADS criteria. _________________________________________________________ Additional benign stable subcentimeter hypoechoic nodules in the right thyroid all measuring 7 mm or less. No hypervascularity.  No regional adenopathy. IMPRESSION: Slightly larger 5.7 cm left mid thyroid TR 3 nodule continues to meet criteria for biopsy as above. The above is in keeping with the ACR TI-RADS recommendations - J Am Coll Radiol 2017;14:587-595. Electronically Signed   By: Judie Petit.  Shick M.D.   On: 09/25/2020 11:20    Anti-infectives: Anti-infectives (From admission, onward)    Start     Dose/Rate Route Frequency Ordered Stop   09/25/20 1200  vancomycin (VANCOREADY) IVPB 1750 mg/350 mL        1,750 mg 175 mL/hr over 120 Minutes Intravenous Every 48 hours 09/23/20 1056  09/29/20 2359   09/23/20 1400  ceFEPIme (MAXIPIME) 2 g in sodium chloride 0.9 % 100 mL IVPB        2 g 200 mL/hr over 30 Minutes Intravenous Every 12 hours 09/23/20 1047 09/29/20 2359   09/23/20 1400  metroNIDAZOLE (FLAGYL) tablet 500 mg  Status:  Discontinued        500 mg Oral Every 12 hours 09/23/20 1047 09/24/20 1028   09/23/20 1200  vancomycin (VANCOREADY) IVPB 2000 mg/400 mL        2,000 mg 200 mL/hr over 120 Minutes Intravenous  Once 09/23/20 1047 09/23/20 1326   09/21/20 1400  piperacillin-tazobactam (ZOSYN) IVPB 3.375 g  Status:  Discontinued        3.375 g 12.5 mL/hr over 240 Minutes Intravenous Every 8  hours 09/21/20 1122 09/23/20 1047   09/18/20 1115  cefoTEtan (CEFOTAN) 2 g in sodium chloride 0.9 % 100 mL IVPB        2 g 200 mL/hr over 30 Minutes Intravenous  Once 09/18/20 1112 09/18/20 1250   09/18/20 1114  sodium chloride 0.9 % with cefoTEtan (CEFOTAN) ADS Med       Note to Pharmacy: Janene Harvey   : cabinet override      09/18/20 1114 09/18/20 1253        Assessment/Plan POD#8 ITP/Thrombocytopenia s/p Hand-assisted laparoscopic splenectomy 6/15 Dr. Donell Beers - Received vaccines pre-op - CT 9/20 reassuring and without organized abscess - Cont JP drain. Checking drain amylase - Suspect small hematoma adjacent to upper incision. No evidence of cellulitis  - Mobilize. PT rec CIR vs SNF (patient wishes to go home per notes)   FEN - CM VTE - SCD, Lovenox  ID - Vanc/Cefepime until 9/26 per ID. Afebrile. WBC down.  Foley - in place. Urecholine. TOV today vs tomorrow   - Per TRH -  Staph epi bacteremia - ID following. Repeat blood cx's pending.  L pleural effusion s/p thora 9/19. Cx neg. Loculated pleural effusion on CT 9/21 Cecal wall thickening - noted on CT, GI following and determining timing of colonoscopy  Thyroid nodule - TRH working up ABL anemia - hgb 8.1 from 9 WC bound due to RLE BKA DM CHF HTN H/O CVA AKI - Improving, Cr 2.06 > 1.71 > 1.47   LOS: 14 days    Jacinto Halim , [facility] 09/26/2020, 8:39 AM Please see Amion for pager number during day hours 7:00am-4:30pm

## 2020-09-27 DIAGNOSIS — D649 Anemia, unspecified: Secondary | ICD-10-CM | POA: Diagnosis not present

## 2020-09-27 DIAGNOSIS — D509 Iron deficiency anemia, unspecified: Secondary | ICD-10-CM | POA: Diagnosis not present

## 2020-09-27 DIAGNOSIS — D693 Immune thrombocytopenic purpura: Secondary | ICD-10-CM | POA: Diagnosis not present

## 2020-09-27 DIAGNOSIS — R7881 Bacteremia: Secondary | ICD-10-CM | POA: Diagnosis not present

## 2020-09-27 DIAGNOSIS — R5381 Other malaise: Secondary | ICD-10-CM

## 2020-09-27 DIAGNOSIS — Z89511 Acquired absence of right leg below knee: Secondary | ICD-10-CM

## 2020-09-27 LAB — BASIC METABOLIC PANEL
Anion gap: 7 (ref 5–15)
BUN: 30 mg/dL — ABNORMAL HIGH (ref 6–20)
CO2: 26 mmol/L (ref 22–32)
Calcium: 7.9 mg/dL — ABNORMAL LOW (ref 8.9–10.3)
Chloride: 104 mmol/L (ref 98–111)
Creatinine, Ser: 1.42 mg/dL — ABNORMAL HIGH (ref 0.44–1.00)
GFR, Estimated: 44 mL/min — ABNORMAL LOW (ref >60)
Glucose, Bld: 103 mg/dL — ABNORMAL HIGH (ref 70–99)
Potassium: 3.5 mmol/L (ref 3.5–5.1)
Sodium: 137 mmol/L (ref 135–145)

## 2020-09-27 LAB — CBC WITH DIFFERENTIAL/PLATELET
Abs Immature Granulocytes: 0.2 10*3/uL — ABNORMAL HIGH (ref 0.00–0.07)
Basophils Absolute: 0.6 10*3/uL — ABNORMAL HIGH (ref 0.0–0.1)
Basophils Relative: 4 %
Eosinophils Absolute: 0.6 10*3/uL — ABNORMAL HIGH (ref 0.0–0.5)
Eosinophils Relative: 4 %
HCT: 27.7 % — ABNORMAL LOW (ref 36.0–46.0)
Hemoglobin: 8 g/dL — ABNORMAL LOW (ref 12.0–15.0)
Lymphocytes Relative: 7 %
Lymphs Abs: 1.1 10*3/uL (ref 0.7–4.0)
MCH: 25.1 pg — ABNORMAL LOW (ref 26.0–34.0)
MCHC: 28.9 g/dL — ABNORMAL LOW (ref 30.0–36.0)
MCV: 86.8 fL (ref 80.0–100.0)
Monocytes Absolute: 2.2 10*3/uL — ABNORMAL HIGH (ref 0.1–1.0)
Monocytes Relative: 14 %
Myelocytes: 1 %
Neutro Abs: 10.9 10*3/uL — ABNORMAL HIGH (ref 1.7–7.7)
Neutrophils Relative %: 70 %
Platelets: 755 10*3/uL — ABNORMAL HIGH (ref 150–400)
RBC: 3.19 MIL/uL — ABNORMAL LOW (ref 3.87–5.11)
RDW: 24.1 % — ABNORMAL HIGH (ref 11.5–15.5)
WBC: 15.6 10*3/uL — ABNORMAL HIGH (ref 4.0–10.5)
nRBC: 0 % (ref 0.0–0.2)
nRBC: 0 /100 WBC

## 2020-09-27 LAB — GLUCOSE, CAPILLARY
Glucose-Capillary: 110 mg/dL — ABNORMAL HIGH (ref 70–99)
Glucose-Capillary: 115 mg/dL — ABNORMAL HIGH (ref 70–99)
Glucose-Capillary: 147 mg/dL — ABNORMAL HIGH (ref 70–99)
Glucose-Capillary: 144 mg/dL — ABNORMAL HIGH (ref 70–99)
Glucose-Capillary: 162 mg/dL — ABNORMAL HIGH (ref 70–99)

## 2020-09-27 MED ORDER — POTASSIUM CHLORIDE CRYS ER 20 MEQ PO TBCR
40.0000 meq | EXTENDED_RELEASE_TABLET | Freq: Once | ORAL | Status: AC
  Administered 2020-09-27: 40 meq via ORAL

## 2020-09-27 MED ORDER — VANCOMYCIN HCL 1750 MG/350ML IV SOLN
1750.0000 mg | INTRAVENOUS | Status: DC
  Filled 2020-09-27: qty 350

## 2020-09-27 MED ORDER — VANCOMYCIN HCL 1250 MG/250ML IV SOLN
1250.0000 mg | INTRAVENOUS | Status: AC
Start: 2020-09-27 — End: 2020-09-29
  Administered 2020-09-27 – 2020-09-29 (×3): 1250 mg via INTRAVENOUS
  Filled 2020-09-27 (×3): qty 250

## 2020-09-27 NOTE — Plan of Care (Signed)

## 2020-09-27 NOTE — Progress Notes (Signed)
9 Days Post-Op  Subjective: CC: Some soreness of her epigastrium that is improved from yesterday. Feels she is eating the normal amount she would at home without n/v. Passing flatus. BM yesterday. Foley in place. TOV today. Working with therapies. CIR following.   Objective: Vital signs in last 24 hours: Temp:  [98.5 F (36.9 C)-98.6 F (37 C)] 98.6 F (37 C) (09/24 0357) Pulse Rate:  [78-79] 79 (09/24 0357) Resp:  [16-20] 16 (09/24 0357) BP: (139-163)/(52-58) 139/58 (09/24 0357) SpO2:  [92 %-96 %] 94 % (09/24 0357) Weight:  [109.6 kg] 109.6 kg (09/24 0357) Last BM Date: 09/26/20  Intake/Output from previous day: 09/23 0701 - 09/24 0700 In: 1270 [P.O.:820; IV Piggyback:450] Out: 2628 [Urine:2628] Intake/Output this shift: No intake/output data recorded.  PE: Gen:  Alert, NAD, pleasant Pulm:  On o2. Normal rate and effort  Abd: Soft, mild upper abdominal distension, mild tenderness of the epigastrium over incision with some firmness right lateral upper incision (suspect small hematoma) without signs of cellulitis or infection. Otherwise NT, +BS, incisions C/D/I without erythema or drainage, drain with scant, thin bubbly tan output  Ext:  R BKA. Psych: A&Ox3  Skin: no rashes noted, warm and dry  Lab Results:  Recent Labs    09/26/20 0227 09/27/20 0336  WBC 16.8* 15.6*  HGB 8.1* 8.0*  HCT 27.5* 27.7*  PLT 746* 755*   BMET Recent Labs    09/26/20 0227 09/27/20 0336  NA 136 137  K 3.5 3.5  CL 103 104  CO2 25 26  GLUCOSE 109* 103*  BUN 32* 30*  CREATININE 1.47* 1.42*  CALCIUM 7.7* 7.9*   PT/INR Recent Labs    09/24/20 1035  LABPROT 15.8*  INR 1.3*   CMP     Component Value Date/Time   NA 137 09/27/2020 0336   NA 139 05/16/2019 1522   NA 138 09/30/2015 1118   K 3.5 09/27/2020 0336   K 4.6 09/30/2015 1118   CL 104 09/27/2020 0336   CO2 26 09/27/2020 0336   CO2 19 (L) 09/30/2015 1118   GLUCOSE 103 (H) 09/27/2020 0336   GLUCOSE 200 (H)  09/30/2015 1118   BUN 30 (H) 09/27/2020 0336   BUN 23 05/16/2019 1522   BUN 22.3 09/30/2015 1118   CREATININE 1.42 (H) 09/27/2020 0336   CREATININE 2.63 (H) 06/12/2020 0755   CREATININE 1.3 (H) 09/30/2015 1118   CALCIUM 7.9 (L) 09/27/2020 0336   CALCIUM 9.6 09/30/2015 1118   PROT 6.0 (L) 09/25/2020 0358   PROT 8.2 05/16/2019 1522   ALBUMIN 2.1 (L) 09/25/2020 0358   ALBUMIN 4.2 05/16/2019 1522   AST 17 09/25/2020 0358   AST 15 06/12/2020 0755   ALT 13 09/25/2020 0358   ALT 9 06/12/2020 0755   ALKPHOS 74 09/25/2020 0358   BILITOT 0.7 09/25/2020 0358   BILITOT 0.3 06/12/2020 0755   GFRNONAA 44 (L) 09/27/2020 0336   GFRNONAA 21 (L) 06/12/2020 0755   GFRAA 59 (L) 09/19/2019 1449   Lipase     Component Value Date/Time   LIPASE 23 06/26/2019 0124    Studies/Results: US THYROID  Result Date: 09/25/2020 CLINICAL DATA:  Left thyroid mass by chest CT EXAM: THYROID ULTRASOUND TECHNIQUE: Ultrasound examination of the thyroid gland and adjacent soft tissues was performed. COMPARISON:  09/24/2020, 03/08/2017 FINDINGS: Parenchymal Echotexture: Normal Isthmus: 8 mm Right lobe: 5.3 x 1.9 x 1.8 cm Left lobe: 6.9 x 5.5 x 4.7 cm _________________________________________________________ Estimated total number of nodules >/= 1  cm: 1 Number of spongiform nodules >/=  2 cm not described below (TR1): 0 Number of mixed cystic and solid nodules >/= 1.5 cm not described below (TR2): 0 _________________________________________________________ Nodule # 1: Location: Left; Mid Maximum size: 5.7, previously 4.3 cm; Other 2 dimensions: 5.3 x 4.0 cm Composition: solid/almost completely solid (2) Echogenicity: isoechoic (1) Shape: not taller-than-wide (0) Margins: ill-defined (0) Echogenic foci: none (0) ACR TI-RADS total points: 3. ACR TI-RADS risk category: TR3 (3 points). ACR TI-RADS recommendations: **Given size (>/= 2.5 cm) and appearance, fine needle aspiration of this mildly suspicious nodule should be considered  based on TI-RADS criteria. _________________________________________________________ Additional benign stable subcentimeter hypoechoic nodules in the right thyroid all measuring 7 mm or less. No hypervascularity.  No regional adenopathy. IMPRESSION: Slightly larger 5.7 cm left mid thyroid TR 3 nodule continues to meet criteria for biopsy as above. The above is in keeping with the ACR TI-RADS recommendations - J Am Coll Radiol 2017;14:587-595. Electronically Signed   By: Judie Petit.  Shick M.D.   On: 09/25/2020 11:20    Anti-infectives: Anti-infectives (From admission, onward)    Start     Dose/Rate Route Frequency Ordered Stop   09/25/20 1200  vancomycin (VANCOREADY) IVPB 1750 mg/350 mL        1,750 mg 175 mL/hr over 120 Minutes Intravenous Every 48 hours 09/23/20 1056 09/29/20 2359   09/23/20 1400  ceFEPIme (MAXIPIME) 2 g in sodium chloride 0.9 % 100 mL IVPB        2 g 200 mL/hr over 30 Minutes Intravenous Every 12 hours 09/23/20 1047 09/29/20 2359   09/23/20 1400  metroNIDAZOLE (FLAGYL) tablet 500 mg  Status:  Discontinued        500 mg Oral Every 12 hours 09/23/20 1047 09/24/20 1028   09/23/20 1200  vancomycin (VANCOREADY) IVPB 2000 mg/400 mL        2,000 mg 200 mL/hr over 120 Minutes Intravenous  Once 09/23/20 1047 09/23/20 1326   09/21/20 1400  piperacillin-tazobactam (ZOSYN) IVPB 3.375 g  Status:  Discontinued        3.375 g 12.5 mL/hr over 240 Minutes Intravenous Every 8 hours 09/21/20 1122 09/23/20 1047   09/18/20 1115  cefoTEtan (CEFOTAN) 2 g in sodium chloride 0.9 % 100 mL IVPB        2 g 200 mL/hr over 30 Minutes Intravenous  Once 09/18/20 1112 09/18/20 1250   09/18/20 1114  sodium chloride 0.9 % with cefoTEtan (CEFOTAN) ADS Med       Note to Pharmacy: Janene Harvey   : cabinet override      09/18/20 1114 09/18/20 1253        Assessment/Plan POD#9 ITP/Thrombocytopenia s/p Hand-assisted laparoscopic splenectomy 6/15 Dr. Donell Beers - Received vaccines pre-op - CT 9/20 reassuring and  without organized abscess - Cont JP drain. Drain amylase sent but unable to be performed because specimen was to viscous. Will ask MD if this needs to be repeated.  - Suspect small hematoma adjacent to upper incision. No evidence of cellulitis  - Mobilize. Therapies following. CIR following.    FEN - CM VTE - SCD, Lovenox  ID - Vanc/Cefepime until 9/26 per ID. Afebrile. WBC down.  Foley - in place. Urecholine. TOV today    - Per TRH -  Staph epi bacteremia - ID following. Repeat blood cx's pending.  L pleural effusion s/p thora 9/19. Cx neg. Loculated pleural effusion on CT 9/21 Cecal wall thickening - noted on CT, GI following and determining timing of colonoscopy  Thyroid nodule - TRH working up ABL anemia - hgb stable WC bound due to RLE BKA DM CHF HTN H/O CVA AKI - Improving, Cr 2.06 > 1.71 > 1.47 > 1.42   LOS: 15 days    Jacinto Halim , [facility] 09/27/2020, 8:30 AM Please see Amion for pager number during day hours 7:00am-4:30pm

## 2020-09-27 NOTE — Progress Notes (Addendum)
Pharmacy Antibiotic Note  [name] is a 55 y.o. female admitted on 09/12/2020 with ITP.  Now s/p splenectomy on 9/15. Noted that patient has been having fevers up to 102 since 9/18. Now with blood cultures positive for gram positive cocci in 2/2 and MRSE on the [employer]. Pharmacy has been consulted for vancomycin dosing. Patient currently on vancomycin 1750mg  q48h.   Plan: Change to vancomycin 1250 mg every 24 hours with improved renal function  > eAUC 551.2, SCr used 1.42, Cmax 36.2, Vd 0.5 Monitor blood cultures, fever curve, renal function  Continue antibiotics through 9/26 per ID  Height: 5\' 7"  (170.2 cm) Weight: 109.6 kg (241 lb 10 oz) IBW/kg (Calculated) : 61.6  Temp (24hrs), Avg:98.6 F (37 C), Min:98.5 F (36.9 C), Max:98.6 F (37 C)  Recent Labs  Lab 09/22/20 0854 09/22/20 1216 09/22/20 2346 09/23/20 2008 09/23/20 2033 09/23/20 2246 09/24/20 0342 09/25/20 0358 09/26/20 0227 09/27/20 0336  WBC  --   --    < > 17.9*  --   --  17.0* 14.2* 16.8* 15.6*  CREATININE  --   --    < > 2.14*  --   --  2.06* 1.71* 1.47* 1.42*  LATICACIDVEN 0.6 0.9  --   --  1.0 1.1  --   --   --   --    < > = values in this interval not displayed.     Estimated Creatinine Clearance: 58.4 mL/min (A) (by C-G formula based on SCr of 1.42 mg/dL (H)).    No Known Allergies  Antimicrobials this admission: 9/18 Zosyn >> 9/20 9/20 Metronidazole >> 9/21 9/20 Cefepime >> 9/26 9/20 Vancomycin >> 9/26  Microbiology results: 9/18 BCx: 2/2 gram positive cocci (MRSE) 9/19 pleural fluid: ngtd 9/21 Bcx: ngtd   Thank you for allowing pharmacy to be a part of this patient's care. 10/19, PharmD PGY1 Pharmacy Resident 09/27/2020  10:40 AM  Please check [email or web address] for unit-specific pharmacy phone numbers.

## 2020-09-27 NOTE — Progress Notes (Signed)
Foley out at 1500 dtv 2000

## 2020-09-27 NOTE — Progress Notes (Signed)
PROGRESS NOTE  OLUWADARA GORMAN ZOX:[id number] DOB: 03-22-1965   PCP: Annita Brod, MD  Patient is from: Home.  DOA: 09/12/2020 LOS: 15  Chief complaints:  Chief Complaint  Patient presents with   Leg Swelling     Brief Narrative / Interim history: 55 year old F with PMH of ITP, DM-2, CKD-3, diastolic CHF, chronic hypoxic RF, ICH, PVD, right BKA, neurogenic bladder/chronic Foley presenting with shortness of breath, fatigue, physical deconditioning and bruising for about a month, and admitted for ITP/pancytopenia with Hgb of 4.5, platelet <5 and WBC 2.8, and acute on chronic diastolic CHF.  Heme-onc consulted and she was treated with blood transfusion, IVIG, EPO, IV iron, Nplate, steroids and splenectomy with improvement in her pancytopenia.  She was also diuresed with IV Lasix for CHF exacerbation.  Hospital course noteworthy of sepsis/acute hypoxic RF in the setting of HAP/exudative left pleural effusion/MRSE bacteremia.  ID consulted and recommended penicillin G through 9/26.  Therapy recommended CIR.  Subjective: Seen and examined earlier this morning.  No major events overnight of this morning.  She says she feels better today.  Reports some intermittent left upper chest pain.  Not sure about provoking factor.  Currently not in pain.  Breathing has improved.  She denies GI or UTI symptoms.  Objective: Vitals:   09/26/20 1951 09/26/20 2331 09/27/20 0357 09/27/20 1111  BP: (!) 163/55 (!) 141/52 (!) 139/58 (!) 128/56  Pulse: 78 78 79 73  Resp: 16 20 16    Temp: 98.5 F (36.9 C) 98.6 F (37 C) 98.6 F (37 C) 98.9 F (37.2 C)  TempSrc: Oral Oral Oral Oral  SpO2: 96% 92% 94% 96%  Weight:   109.6 kg   Height:        Intake/Output Summary (Last 24 hours) at 09/27/2020 1526 Last data filed at 09/27/2020 1006 Gross per 24 hour  Intake 810 ml  Output 1378 ml  Net -568 ml   Filed Weights   09/25/20 0500 09/26/20 0100 09/27/20 0357  Weight: 112.9 kg 109.6 kg 109.6 kg     Examination:  GENERAL: No apparent distress.  Nontoxic. HEENT: MMM.  Vision and hearing grossly intact.  NECK: Supple.  No apparent JVD.  RESP: 98 to 100% on 4 L.  No IWOB.  Diminished aeration over LLL. CVS:  RRR. Heart sounds normal.  ABD/GI/GU: BS+. Abd soft, NTND.  Indwelling Foley in place. MSK/EXT:  Moves extremities.  Right BKA. SKIN: no apparent skin lesion or wound NEURO: Awake, alert and oriented appropriately.  No apparent focal neuro deficit. PSYCH: Calm. Normal affect.   Procedures:  9/15-splenectomy  Microbiology summarized: 9/9-COVID-19 and influenza PCR nonreactive. 9/18-blood culture with MRSE 9/21-repeat blood cultures NGTD.  Assessment & Plan: ITP/pancytopenia/symptomatic anemia-now with leukocytosis and thrombocytosis.  Hgb 8.0. -Hgb of 4.5, platelet <5 and WBC 2.8 -S/p  IVIG, Nplate, steroids, 1 unit of PRBC and splenectomy with appropriate vaccines on 9/15 -General surgery following. -Needs outpatient follow-up with hematology after discharge?   Sepsis due to HAP/large exudative left pleural effusion/MRSE bacteremia -s/p left-Thora on 9/19.  Subsequent CT with mild left empyema- too small for Thora or chest tube per IR. -Zosyn>> cefepime, Flagyl and vancomycin>>> 09/29/2020 per ID.  Acute respiratory failure with hypoxia-carries history of chronic hypoxic RF.  Currently saturating at 98-100% on 4 L.  Diminished air movement over LLE on exam. -Wean oxygen as able -Encourage IS/OOB/PT/OT -Antibiotics as above    Cecal wall thickening: Noted on CT abdomen and pelvis.  GI consulted.   -Colonoscopy  once she is more stable.  Time was not specified   Essential hypertension: Normotensive.   -Continue Toprol-XL.   -Continue holding home Imdur, amlodipine.   Acute on chronic diastolic CHF: Reportedly out of her Lasix for 1 months prior to arrival. It appears that she was taking 160 mg of Lasix daily.  Restarted on p.o. Lasix 40 mg twice daily.  Had about  2.6 L UOP/24 hours. -Continue Lasix 40 mg twice daily -Monitor fluid status, renal functions and electrolytes   AKI on CKD IIIB/obstructive uropathy-Baseline about 1.51. Recent Labs    09/20/20 0313 09/21/20 0226 09/22/20 0752 09/22/20 2346 09/23/20 0939 09/23/20 2008 09/24/20 0342 09/25/20 0358 09/26/20 0227 09/27/20 0336  BUN 38* 43* 46* 46* 45* 44* 41* 36* 32* 30*  CREATININE 1.57* 2.05* 2.20* 2.10* 1.99* 2.14* 2.06* 1.71* 1.47* 1.42*  -Continue monitoring     Controlled DM-2 with hyperglycemia: A1c 6.2% in 06/2020. Recent Labs  Lab 09/26/20 2329 09/27/20 0355 09/27/20 0739 09/27/20 1109 09/27/20 1556  GLUCAP 149* 110* 115* 147* 144*  -Repeat hemoglobin A1c -Continue SSI-moderate.   Debility/physical deconditioning/right BKA: Has a wheelchair at home. -Therapy recommended CIR   Hyponatremia/hypokalemia: Resolved.  Leukocytosis/thrombocytosis: Likely reactive and demargination from recent steroid.  Last fever was on 9/21. -Continue monitoring -Consider repeat infectious work-up if she spikes fever again.   Left thyroid nodule: 5.7 cm.  TSH 6.8.  -May need FNA outpatient.  Body mass index is 37.84 kg/m.         DVT prophylaxis:  SCDs Start: 09/12/20 1951    On subcu Lovenox  Code Status: Full code Family Communication: Patient and/or RN. Available if any question.  Level of care: Med-Surg Status is: Inpatient  Remains inpatient appropriate because:Unsafe d/c plan  Dispo: The patient is from: Home              Anticipated d/c is to: CIR              Patient currently is medically stable to d/c.   Difficult to place patient No       Consultants:  General surgery Infectious disease Hematology/oncology   Sch Meds:  Scheduled Meds:  bethanechol  10 mg Oral TID   Chlorhexidine Gluconate Cloth  6 each Topical Q0600   enoxaparin (LOVENOX) injection  50 mg Subcutaneous Q24H   folic acid  2 mg Oral Daily   furosemide  40 mg Oral BID    gabapentin  400 mg Oral BID   insulin aspart  0-15 Units Subcutaneous Q4H   metoprolol succinate  25 mg Oral Daily   pantoprazole  40 mg Oral Daily   rosuvastatin  20 mg Oral Daily   Continuous Infusions:  ceFEPime (MAXIPIME) IV 2 g (09/27/20 1447)   vancomycin 1,250 mg (09/27/20 1145)   PRN Meds:.acetaminophen, HYDROmorphone (DILAUDID) injection, loperamide, ondansetron (ZOFRAN) IV, oxyCODONE, polyvinyl alcohol  Antimicrobials: Anti-infectives (From admission, onward)    Start     Dose/Rate Route Frequency Ordered Stop   09/27/20 1045  vancomycin (VANCOREADY) IVPB 1750 mg/350 mL  Status:  Discontinued        1,750 mg 175 mL/hr over 120 Minutes Intravenous Every 24 hours 09/27/20 1040 09/27/20 1043   09/27/20 1045  vancomycin (VANCOREADY) IVPB 1250 mg/250 mL        1,250 mg 166.7 mL/hr over 90 Minutes Intravenous Every 24 hours 09/27/20 1043     09/25/20 1200  vancomycin (VANCOREADY) IVPB 1750 mg/350 mL  Status:  Discontinued  1,750 mg 175 mL/hr over 120 Minutes Intravenous Every 48 hours 09/23/20 1056 09/27/20 1040   09/23/20 1400  ceFEPIme (MAXIPIME) 2 g in sodium chloride 0.9 % 100 mL IVPB        2 g 200 mL/hr over 30 Minutes Intravenous Every 12 hours 09/23/20 1047 09/29/20 2359   09/23/20 1400  metroNIDAZOLE (FLAGYL) tablet 500 mg  Status:  Discontinued        500 mg Oral Every 12 hours 09/23/20 1047 09/24/20 1028   09/23/20 1200  vancomycin (VANCOREADY) IVPB 2000 mg/400 mL        2,000 mg 200 mL/hr over 120 Minutes Intravenous  Once 09/23/20 1047 09/23/20 1326   09/21/20 1400  piperacillin-tazobactam (ZOSYN) IVPB 3.375 g  Status:  Discontinued        3.375 g 12.5 mL/hr over 240 Minutes Intravenous Every 8 hours 09/21/20 1122 09/23/20 1047   09/18/20 1115  cefoTEtan (CEFOTAN) 2 g in sodium chloride 0.9 % 100 mL IVPB        2 g 200 mL/hr over 30 Minutes Intravenous  Once 09/18/20 1112 09/18/20 1250   09/18/20 1114  sodium chloride 0.9 % with cefoTEtan (CEFOTAN) ADS  Med       Note to Pharmacy: Janene Harvey   : cabinet override      09/18/20 1114 09/18/20 1253        I have personally reviewed the following labs and images: CBC: Recent Labs  Lab 09/23/20 2008 09/24/20 0342 09/25/20 0358 09/26/20 0227 09/27/20 0336  WBC 17.9* 17.0* 14.2* 16.8* 15.6*  NEUTROABS 14.9* 12.6* 9.6* 11.8* 10.9*  HGB 8.4* 9.2* 9.0* 8.1* 8.0*  HCT 28.7* 31.9* 29.7* 27.5* 27.7*  MCV 87.2 87.4 85.8 86.5 86.8  PLT 723* 757* 790* 746* 755*   BMP &GFR Recent Labs  Lab 09/22/20 0752 09/22/20 2346 09/23/20 2008 09/24/20 0342 09/25/20 0358 09/26/20 0227 09/27/20 0336  NA 129*   < > 135 135 137 136 137  K 4.1   < > 3.9 3.9 3.6 3.5 3.5  CL 96*   < > 102 101 103 103 104  CO2 23   < > GLUCOSE 93   < > 168* 122* 80 109* 103*  BUN 46*   < > 44* 41* 36* 32* 30*  CREATININE 2.20*   < > 2.14* 2.06* 1.71* 1.47* 1.42*  CALCIUM 6.3*   < > 7.1* 7.5* 7.9* 7.7* 7.9*  MG 1.3*  --   --   --   --  1.7  --    < > = values in this interval not displayed.   Estimated Creatinine Clearance: 58.4 mL/min (A) (by C-G formula based on SCr of 1.42 mg/dL (H)). Liver & Pancreas: Recent Labs  Lab 09/22/20 2346 09/23/20 0939 09/23/20 2008 09/24/20 0342 09/25/20 0358  AST 23 33 ALT ALKPHOS 67 69 75 83 74  BILITOT 0.4 0.6 0.5 0.6 0.7  PROT 5.8* 5.9* 6.2* 6.4* 6.0*  ALBUMIN 2.0* 2.0* 2.0* 2.2* 2.1*   Recent Labs  Lab 09/22/20 1825  AMYLASE 75   No results for input(s): AMMONIA in the last 168 hours. Diabetic: No results for input(s): HGBA1C in the last 72 hours. Recent Labs  Lab 09/26/20 1949 09/26/20 2329 09/27/20 0355 09/27/20 0739 09/27/20 1109  GLUCAP 106* 149* 110* 115* 147*   Cardiac Enzymes: No results for input(s): CKTOTAL, CKMB, CKMBINDEX, TROPONINI in the last 168  hours. No results for input(s): PROBNP in the last 8760 hours. Coagulation Profile: Recent Labs  Lab 09/24/20 1035  INR 1.3*   Thyroid Function  Tests: Recent Labs    09/26/20 0915  TSH 6.884*   Lipid Profile: No results for input(s): CHOL, HDL, LDLCALC, TRIG, CHOLHDL, LDLDIRECT in the last 72 hours. Anemia Panel: No results for input(s): VITAMINB12, FOLATE, FERRITIN, TIBC, IRON, RETICCTPCT in the last 72 hours. Urine analysis:    Component Value Date/Time   COLORURINE YELLOW 09/22/2020 1212   APPEARANCEUR CLEAR 09/22/2020 1212   LABSPEC <1.005 (L) 09/22/2020 1212   PHURINE 5.5 09/22/2020 1212   GLUCOSEU NEGATIVE 09/22/2020 1212   HGBUR NEGATIVE 09/22/2020 1212   BILIRUBINUR NEGATIVE 09/22/2020 1212   KETONESUR NEGATIVE 09/22/2020 1212   PROTEINUR NEGATIVE 09/22/2020 1212   UROBILINOGEN 2.0 (H) 07/08/2011 0051   NITRITE NEGATIVE 09/22/2020 1212   LEUKOCYTESUR SMALL (A) 09/22/2020 1212   Sepsis Labs: Invalid input(s): PROCALCITONIN, LACTICIDVEN  Microbiology: Recent Results (from the past 240 hour(s))  Culture, blood (routine x 2)     Status: Abnormal   Collection Time: 09/21/20 10:57 AM   Specimen: BLOOD  Result Value Ref Range Status   Specimen Description BLOOD SITE NOT SPECIFIED  Final   Special Requests   Final    BOTTLES DRAWN AEROBIC AND ANAEROBIC Blood Culture adequate volume   Culture  Setup Time   Final    AEROBIC BOTTLE ONLY GRAM POSITIVE COCCI IN CLUSTERS CRITICAL VALUE NOTED.  VALUE IS CONSISTENT WITH PREVIOUSLY REPORTED AND CALLED VALUE. Performed at [facility], [address], [place], [place] [address]    Culture STAPHYLOCOCCUS EPIDERMIDIS (A)  Final   Report Status 09/26/2020 FINAL  Final   Organism ID, Bacteria STAPHYLOCOCCUS EPIDERMIDIS  Final      Susceptibility   Staphylococcus epidermidis - MIC*    CIPROFLOXACIN <=0.5 SENSITIVE Sensitive     ERYTHROMYCIN >=8 RESISTANT Resistant     GENTAMICIN <=0.5 SENSITIVE Sensitive     OXACILLIN >=4 RESISTANT Resistant     TETRACYCLINE >=16 RESISTANT Resistant     VANCOMYCIN 2 SENSITIVE Sensitive     TRIMETH/SULFA <=10 SENSITIVE Sensitive      CLINDAMYCIN <=0.25 SENSITIVE Sensitive     RIFAMPIN <=0.5 SENSITIVE Sensitive     Inducible Clindamycin NEGATIVE Sensitive     * STAPHYLOCOCCUS EPIDERMIDIS  Culture, blood (routine x 2)     Status: Abnormal   Collection Time: 09/21/20 11:20 AM   Specimen: BLOOD RIGHT HAND  Result Value Ref Range Status   Specimen Description BLOOD RIGHT HAND  Final   Special Requests   Final    BOTTLES DRAWN AEROBIC ONLY Blood Culture adequate volume   Culture  Setup Time   Final    GRAM POSITIVE COCCI IN CLUSTERS AEROBIC BOTTLE ONLY CRITICAL RESULT CALLED TO, READ BACK BY AND VERIFIED WITHCeledonio Miyamoto [facility] 5188 09/22/20 A BROWNING Performed at [facility], [address], [place], [place] [address]    Culture STAPHYLOCOCCUS EPIDERMIDIS (A)  Final   Report Status 09/24/2020 FINAL  Final   Organism ID, Bacteria STAPHYLOCOCCUS EPIDERMIDIS  Final      Susceptibility   Staphylococcus epidermidis - MIC*    CIPROFLOXACIN <=0.5 SENSITIVE Sensitive     ERYTHROMYCIN >=8 RESISTANT Resistant     GENTAMICIN <=0.5 SENSITIVE Sensitive     OXACILLIN >=4 RESISTANT Resistant     TETRACYCLINE >=16 RESISTANT Resistant     VANCOMYCIN 2 SENSITIVE Sensitive  TRIMETH/SULFA <=10 SENSITIVE Sensitive     CLINDAMYCIN <=0.25 SENSITIVE Sensitive     RIFAMPIN <=0.5 SENSITIVE Sensitive     Inducible Clindamycin NEGATIVE Sensitive     * STAPHYLOCOCCUS EPIDERMIDIS  Blood Culture ID Panel (Reflexed)     Status: Abnormal   Collection Time: 09/21/20 11:20 AM  Result Value Ref Range Status   Enterococcus faecalis NOT DETECTED NOT DETECTED Final   Enterococcus Faecium NOT DETECTED NOT DETECTED Final   Listeria monocytogenes NOT DETECTED NOT DETECTED Final   Staphylococcus species DETECTED (A) NOT DETECTED Final    Comment: CRITICAL RESULT CALLED TO, READ BACK BY AND VERIFIED WITH: Celedonio Miyamoto PHARMD 1627 09/22/20 A BROWNING    Staphylococcus aureus (BCID) NOT DETECTED NOT DETECTED Final   Staphylococcus  epidermidis DETECTED (A) NOT DETECTED Final    Comment: Methicillin (oxacillin) resistant coagulase negative staphylococcus. Possible blood culture contaminant (unless isolated from more than one blood culture draw or clinical case suggests pathogenicity). No antibiotic treatment is indicated for blood  culture contaminants. CRITICAL RESULT CALLED TO, READ BACK BY AND VERIFIED WITH: Celedonio Miyamoto PHARMD 1627 09/22/20 A BROWNING    Staphylococcus lugdunensis NOT DETECTED NOT DETECTED Final   Streptococcus species NOT DETECTED NOT DETECTED Final   Streptococcus agalactiae NOT DETECTED NOT DETECTED Final   Streptococcus pneumoniae NOT DETECTED NOT DETECTED Final   Streptococcus pyogenes NOT DETECTED NOT DETECTED Final   A.calcoaceticus-baumannii NOT DETECTED NOT DETECTED Final   Bacteroides fragilis NOT DETECTED NOT DETECTED Final   Enterobacterales NOT DETECTED NOT DETECTED Final   Enterobacter cloacae complex NOT DETECTED NOT DETECTED Final   Escherichia coli NOT DETECTED NOT DETECTED Final   Klebsiella aerogenes NOT DETECTED NOT DETECTED Final   Klebsiella oxytoca NOT DETECTED NOT DETECTED Final   Klebsiella pneumoniae NOT DETECTED NOT DETECTED Final   Proteus species NOT DETECTED NOT DETECTED Final   Salmonella species NOT DETECTED NOT DETECTED Final   Serratia marcescens NOT DETECTED NOT DETECTED Final   Haemophilus influenzae NOT DETECTED NOT DETECTED Final   Neisseria meningitidis NOT DETECTED NOT DETECTED Final   Pseudomonas aeruginosa NOT DETECTED NOT DETECTED Final   Stenotrophomonas maltophilia NOT DETECTED NOT DETECTED Final   Candida albicans NOT DETECTED NOT DETECTED Final   Candida auris NOT DETECTED NOT DETECTED Final   Candida glabrata NOT DETECTED NOT DETECTED Final   Candida krusei NOT DETECTED NOT DETECTED Final   Candida parapsilosis NOT DETECTED NOT DETECTED Final   Candida tropicalis NOT DETECTED NOT DETECTED Final   Cryptococcus neoformans/gattii NOT DETECTED NOT  DETECTED Final   Methicillin resistance mecA/C DETECTED (A) NOT DETECTED Final    Comment: CRITICAL RESULT CALLED TO, READ BACK BY AND VERIFIED WITHCeledonio Miyamoto [facility] 0240 09/22/20 A BROWNING Performed at [facility], [address] [place], [place] [address]   Culture, body fluid w Gram Stain-bottle     Status: None (Preliminary result)   Collection Time: 09/22/20  4:51 PM   Specimen: Pleura  Result Value Ref Range Status   Specimen Description PLEURAL  Final   Special Requests NONE  Final   Culture   Final    NO GROWTH 3 DAYS Performed at [facility], [address] [place], [place] [address]    Report Status PENDING  Incomplete  Gram stain     Status: None   Collection Time: 09/22/20  4:51 PM   Specimen: Pleura  Result Value Ref Range Status   Specimen Description PLEURAL  Final   Special  Requests NONE  Final   Gram Stain   Final    ABUNDANT WBC PRESENT, PREDOMINANTLY PMN NO ORGANISMS SEEN Performed at [facility], [address] [place], [place] [address]    Report Status 09/22/2020 FINAL  Final  Culture, blood (routine x 2)     Status: None (Preliminary result)   Collection Time: 09/24/20  9:55 AM   Specimen: BLOOD LEFT HAND  Result Value Ref Range Status   Specimen Description BLOOD LEFT HAND  Final   Special Requests   Final    BOTTLES DRAWN AEROBIC AND ANAEROBIC Blood Culture adequate volume   Culture   Final    NO GROWTH 3 DAYS Performed at [facility], [address] [place], [place] [address]    Report Status PENDING  Incomplete  Culture, blood (routine x 2)     Status: None (Preliminary result)   Collection Time: 09/24/20 10:05 AM   Specimen: BLOOD RIGHT HAND  Result Value Ref Range Status   Specimen Description BLOOD RIGHT HAND  Final   Special Requests   Final    BOTTLES DRAWN AEROBIC AND ANAEROBIC Blood Culture adequate volume   Culture   Final    NO GROWTH 3 DAYS Performed at [facility], [address]  [place], [place] [address]    Report Status PENDING  Incomplete    Radiology Studies: No results found.    Sophia Cubero T. Aylen Stradford [facility]  If 7PM-7AM, please contact night-coverage [email or web address] 09/27/2020, 3:26 PM

## 2020-09-27 NOTE — Progress Notes (Signed)
HOSPITAL MEDICINE OVERNIGHT EVENT NOTE    Notified by nursing that patient has not voided since her Foley was removed earlier this evening.  Bladder scan performed revealing 90 to 150cc's in the bladder.  Abdomen is nontender and patient has not complaining of any pain.  Advised nursing to wait another 2 to 4 hours and perform another bladder scan.  Nursing is to notify me if patient has greater than 300 cc in the bladder and is unable to urinate because at that point patient will likely need intermittent urinary catheterization.   Marinda Elk  MD [facility]

## 2020-09-27 NOTE — Progress Notes (Signed)
Pt has not voided since foley removed. Stated she doesn't feel the need to void. Obtained bladder scan showing approx 90-177ml.

## 2020-09-28 ENCOUNTER — Inpatient Hospital Stay (HOSPITAL_COMMUNITY): Payer: Medicare Other

## 2020-09-28 DIAGNOSIS — D693 Immune thrombocytopenic purpura: Secondary | ICD-10-CM | POA: Diagnosis not present

## 2020-09-28 DIAGNOSIS — D509 Iron deficiency anemia, unspecified: Secondary | ICD-10-CM | POA: Diagnosis not present

## 2020-09-28 DIAGNOSIS — D649 Anemia, unspecified: Secondary | ICD-10-CM | POA: Diagnosis not present

## 2020-09-28 DIAGNOSIS — R7881 Bacteremia: Secondary | ICD-10-CM | POA: Diagnosis not present

## 2020-09-28 LAB — RETICULOCYTES
Immature Retic Fract: 12.3 % (ref 2.3–15.9)
RBC.: 3.27 MIL/uL — ABNORMAL LOW (ref 3.87–5.11)
Retic Count, Absolute: 54.9 10*3/uL (ref 19.0–186.0)
Retic Ct Pct: 1.7 % (ref 0.4–3.1)

## 2020-09-28 LAB — CBC
HCT: 28 % — ABNORMAL LOW (ref 36.0–46.0)
Hemoglobin: 8 g/dL — ABNORMAL LOW (ref 12.0–15.0)
MCH: 24.8 pg — ABNORMAL LOW (ref 26.0–34.0)
MCHC: 28.6 g/dL — ABNORMAL LOW (ref 30.0–36.0)
MCV: 86.7 fL (ref 80.0–100.0)
Platelets: 810 10*3/uL — ABNORMAL HIGH (ref 150–400)
RBC: 3.23 MIL/uL — ABNORMAL LOW (ref 3.87–5.11)
RDW: 23.5 % — ABNORMAL HIGH (ref 11.5–15.5)
WBC: 19.2 10*3/uL — ABNORMAL HIGH (ref 4.0–10.5)
nRBC: 0 % (ref 0.0–0.2)

## 2020-09-28 LAB — IRON AND TIBC
Iron: 31 ug/dL (ref 28–170)
Saturation Ratios: 14 % (ref 10.4–31.8)
TIBC: 214 ug/dL — ABNORMAL LOW (ref 250–450)
UIBC: 183 ug/dL

## 2020-09-28 LAB — HEMOGLOBIN A1C
Hgb A1c MFr Bld: 5.5 % (ref 4.8–5.6)
Mean Plasma Glucose: 111.15 mg/dL

## 2020-09-28 LAB — BRAIN NATRIURETIC PEPTIDE: B Natriuretic Peptide: 1051.2 pg/mL — ABNORMAL HIGH (ref 0.0–100.0)

## 2020-09-28 LAB — RENAL FUNCTION PANEL
Albumin: 2.1 g/dL — ABNORMAL LOW (ref 3.5–5.0)
Anion gap: 8 (ref 5–15)
BUN: 29 mg/dL — ABNORMAL HIGH (ref 6–20)
CO2: 25 mmol/L (ref 22–32)
Calcium: 8.1 mg/dL — ABNORMAL LOW (ref 8.9–10.3)
Chloride: 105 mmol/L (ref 98–111)
Creatinine, Ser: 1.31 mg/dL — ABNORMAL HIGH (ref 0.44–1.00)
GFR, Estimated: 49 mL/min — ABNORMAL LOW (ref >60)
Glucose, Bld: 103 mg/dL — ABNORMAL HIGH (ref 70–99)
Phosphorus: 2.4 mg/dL — ABNORMAL LOW (ref 2.5–4.6)
Potassium: 4.1 mmol/L (ref 3.5–5.1)
Sodium: 138 mmol/L (ref 135–145)

## 2020-09-28 LAB — FOLATE: Folate: 26.3 ng/mL (ref >5.9)

## 2020-09-28 LAB — MAGNESIUM: Magnesium: 1.7 mg/dL (ref 1.7–2.4)

## 2020-09-28 LAB — GLUCOSE, CAPILLARY
Glucose-Capillary: 106 mg/dL — ABNORMAL HIGH (ref 70–99)
Glucose-Capillary: 111 mg/dL — ABNORMAL HIGH (ref 70–99)
Glucose-Capillary: 138 mg/dL — ABNORMAL HIGH (ref 70–99)
Glucose-Capillary: 140 mg/dL — ABNORMAL HIGH (ref 70–99)
Glucose-Capillary: 187 mg/dL — ABNORMAL HIGH (ref 70–99)
Glucose-Capillary: 99 mg/dL (ref 70–99)

## 2020-09-28 LAB — VITAMIN B12: Vitamin B-12: 177 pg/mL — ABNORMAL LOW (ref 180–914)

## 2020-09-28 LAB — FERRITIN: Ferritin: 416 ng/mL — ABNORMAL HIGH (ref 11–307)

## 2020-09-28 MED ORDER — FENTANYL CITRATE PF 50 MCG/ML IJ SOSY
25.0000 ug | PREFILLED_SYRINGE | INTRAMUSCULAR | Status: DC | PRN
  Filled 2020-09-28: qty 1

## 2020-09-28 NOTE — Plan of Care (Signed)

## 2020-09-28 NOTE — Progress Notes (Signed)
10 Days Post-Op  Subjective: CC: Seen with [facility]. Patient reports that she had an episode of emesis this am that was not related to eating. Has not had any food since. Denies nausea presently. Had some right sided and epigastric pain yesterday but is NT on exam this morning. Drain remains cloudy/tan. Has voided since foley removal.   Objective: Vital signs in last 24 hours: Temp:  [98.7 F (37.1 C)-99.7 F (37.6 C)] 98.7 F (37.1 C) (09/25 0848) Pulse Rate:  [73-83] 83 (09/25 0848) Resp:  [17-19] 18 (09/25 0848) BP: (128-168)/(49-60) 168/57 (09/25 0848) SpO2:  [82 %-98 %] 98 % (09/25 0848) Last BM Date: 09/26/20  Intake/Output from previous day: 09/24 0701 - 09/25 0700 In: 2360 [P.O.:960; IV Piggyback:1400] Out: 700 [Urine:700] Intake/Output this shift: No intake/output data recorded.  PE: Gen:  Alert, NAD, pleasant Pulm:  On o2. Normal rate and effort  Abd: Soft, mild upper abdominal distension, mild tenderness of the epigastrium over incision with some firmness right lateral upper incision (suspect small hematoma) without signs of cellulitis or infection. Otherwise NT, +BS, incisions C/D/I without erythema or drainage, drain with scant, cloudy tan output  Ext:  R BKA. Psych: A&Ox3  Skin: no rashes noted, warm and dry  Lab Results:  Recent Labs    09/27/20 0336 09/28/20 0334  WBC 15.6* 19.2*  HGB 8.0* 8.0*  HCT 27.7* 28.0*  PLT 755* 810*   BMET Recent Labs    09/27/20 0336 09/28/20 0334  NA 137 138  K 3.5 4.1  CL 104 105  CO2 26 25  GLUCOSE 103* 103*  BUN 30* 29*  CREATININE 1.42* 1.31*  CALCIUM 7.9* 8.1*   PT/INR No results for input(s): LABPROT, INR in the last 72 hours. CMP     Component Value Date/Time   NA 138 09/28/2020 0334   NA 139 05/16/2019 1522   NA 138 09/30/2015 1118   K 4.1 09/28/2020 0334   K 4.6 09/30/2015 1118   CL 105 09/28/2020 0334   CO2 25 09/28/2020 0334   CO2 19 (L) 09/30/2015 1118   GLUCOSE 103 (H) 09/28/2020 0334    GLUCOSE 200 (H) 09/30/2015 1118   BUN 29 (H) 09/28/2020 0334   BUN 23 05/16/2019 1522   BUN 22.3 09/30/2015 1118   CREATININE 1.31 (H) 09/28/2020 0334   CREATININE 2.63 (H) 06/12/2020 0755   CREATININE 1.3 (H) 09/30/2015 1118   CALCIUM 8.1 (L) 09/28/2020 0334   CALCIUM 9.6 09/30/2015 1118   PROT 6.0 (L) 09/25/2020 0358   PROT 8.2 05/16/2019 1522   ALBUMIN 2.1 (L) 09/28/2020 0334   ALBUMIN 4.2 05/16/2019 1522   AST 17 09/25/2020 0358   AST 15 06/12/2020 0755   ALT 13 09/25/2020 0358   ALT 9 06/12/2020 0755   ALKPHOS 74 09/25/2020 0358   BILITOT 0.7 09/25/2020 0358   BILITOT 0.3 06/12/2020 0755   GFRNONAA 49 (L) 09/28/2020 0334   GFRNONAA 21 (L) 06/12/2020 0755   GFRAA 59 (L) 09/19/2019 1449   Lipase     Component Value Date/Time   LIPASE 23 06/26/2019 0124    Studies/Results: No results found.  Anti-infectives: Anti-infectives (From admission, onward)    Start     Dose/Rate Route Frequency Ordered Stop   09/27/20 1045  vancomycin (VANCOREADY) IVPB 1750 mg/350 mL  Status:  Discontinued        1,750 mg 175 mL/hr over 120 Minutes Intravenous Every 24 hours 09/27/20 1040 09/27/20 1043   09/27/20 1045  vancomycin (VANCOREADY) IVPB 1250 mg/250 mL        1,250 mg 166.7 mL/hr over 90 Minutes Intravenous Every 24 hours 09/27/20 1043     09/25/20 1200  vancomycin (VANCOREADY) IVPB 1750 mg/350 mL  Status:  Discontinued        1,750 mg 175 mL/hr over 120 Minutes Intravenous Every 48 hours 09/23/20 1056 09/27/20 1040   09/23/20 1400  ceFEPIme (MAXIPIME) 2 g in sodium chloride 0.9 % 100 mL IVPB        2 g 200 mL/hr over 30 Minutes Intravenous Every 12 hours 09/23/20 1047 09/29/20 2359   09/23/20 1400  metroNIDAZOLE (FLAGYL) tablet 500 mg  Status:  Discontinued        500 mg Oral Every 12 hours 09/23/20 1047 09/24/20 1028   09/23/20 1200  vancomycin (VANCOREADY) IVPB 2000 mg/400 mL        2,000 mg 200 mL/hr over 120 Minutes Intravenous  Once 09/23/20 1047 09/23/20 1326    09/21/20 1400  piperacillin-tazobactam (ZOSYN) IVPB 3.375 g  Status:  Discontinued        3.375 g 12.5 mL/hr over 240 Minutes Intravenous Every 8 hours 09/21/20 1122 09/23/20 1047   09/18/20 1115  cefoTEtan (CEFOTAN) 2 g in sodium chloride 0.9 % 100 mL IVPB        2 g 200 mL/hr over 30 Minutes Intravenous  Once 09/18/20 1112 09/18/20 1250   09/18/20 1114  sodium chloride 0.9 % with cefoTEtan (CEFOTAN) ADS Med       Note to Pharmacy: Janene Harvey   : cabinet override      09/18/20 1114 09/18/20 1253        Assessment/Plan POD#10 ITP/Thrombocytopenia s/p Hand-assisted laparoscopic splenectomy 6/15 Dr. Donell Beers - Received vaccines pre-op - CT 9/20 reassuring and without organized abscess. WBC up today but difficult to determine if from recent splenectomy. Awaiting drain amylase - Cont JP drain. Resend drain amylase  - Suspect small hematoma adjacent to upper incision. No evidence of cellulitis  - Mobilize. Therapies following. CIR following.    FEN - CM VTE - SCD, Lovenox  ID - Vanc/Cefepime until 9/26 per ID. Afebrile. WBC up today  Foley -out, voiding.    - Per [facility] -  Staph epi bacteremia - ID following. Repeat blood cx's pending.  L pleural effusion s/p thora 9/19. Cx neg. Loculated pleural effusion on CT 9/21 Cecal wall thickening - noted on CT, GI following and determining timing of colonoscopy  Thyroid nodule - [facility] working up ABL anemia - hgb stable WC bound due to RLE BKA DM CHF HTN H/O CVA AKI - Improving, Cr 2.06 > 1.71 > 1.47 > 1.42 > 1.31   LOS: 16 days    Jacinto Halim , [facility] Surgery 09/28/2020, 9:33 AM Please see Amion for pager number during day hours 7:00am-4:30pm

## 2020-09-28 NOTE — Progress Notes (Signed)
PROGRESS NOTE  [name] TDS:[id number] DOB: [birth year]   PCP: Annita Brod, MD  Patient is from: Home.  DOA: 09/12/2020 LOS: 16  Chief complaints:  Chief Complaint  Patient presents with   Leg Swelling     Brief Narrative / Interim history: 55 year old F with PMH of ITP, DM-2, CKD-3, diastolic CHF, ICH, PVD, right BKA, neurogenic bladder/chronic Foley presenting with shortness of breath, fatigue, physical deconditioning and bruising for about a month, and admitted for ITP/pancytopenia with Hgb of 4.5, platelet <5 and WBC 2.8, and acute on chronic diastolic CHF.  Heme-onc consulted and she was treated with blood transfusion, IVIG, EPO, IV iron, Nplate, steroids and splenectomy with improvement in her pancytopenia.  She was also diuresed with IV Lasix for CHF exacerbation.  Hospital course noteworthy of sepsis/acute hypoxic RF in the setting of HAP/exudative left pleural effusion/MRSE bacteremia.  ID consulted and recommended penicillin G through 9/26.  Therapy recommended CIR.  Subjective: Seen and examined earlier this morning.  No major events overnight.  She says she had an episode of emesis earlier this morning before breakfast.  She did not look at the emesis.  Reports congestion in her nose.   Objective: Vitals:   09/27/20 2012 09/28/20 0341 09/28/20 0342 09/28/20 0848  BP: (!) 138/49  (!) 158/60 (!) 168/57  Pulse: 80 83  83  Resp: 19  17 18   Temp: 99.7 F (37.6 C)  98.8 F (37.1 C) 98.7 F (37.1 C)  TempSrc: Oral  Oral Oral  SpO2: (!) 82% 96%  98%  Weight:      Height:        Intake/Output Summary (Last 24 hours) at 09/28/2020 1201 Last data filed at 09/28/2020 1155 Gross per 24 hour  Intake 1880 ml  Output 1200 ml  Net 680 ml   Filed Weights   09/25/20 0500 09/26/20 0100 09/27/20 0357  Weight: 112.9 kg 109.6 kg 109.6 kg    Examination:  GENERAL: No apparent distress.  Nontoxic. HEENT: MMM.  Vision and hearing grossly intact.  NECK: Supple.  No  apparent JVD.  RESP: 96% on 4 L.  No IWOB.  Diminished air sounds over left lower lung.  Crackles.  Very poor inspiratory effort on incentive spirometry. CVS:  RRR. Heart sounds normal.  ABD/GI/GU: BS+. Abd soft.  No significant tenderness.  Surgical wounds clean.  JP drain with small purulence material. MSK/EXT:  Moves extremities.  Right BKA. SKIN: Surgical wound appears clean. NEURO: Awake and alert. Oriented appropriately.  No apparent focal neuro deficit. PSYCH: Calm. Normal affect.     Procedures:  9/15-splenectomy  Microbiology summarized: 9/9-COVID-19 and influenza PCR nonreactive. 9/18-blood culture with MRSE 9/21-repeat blood cultures NGTD.  Assessment & Plan: ITP/pancytopenia/symptomatic anemia-now with leukocytosis and thrombocytosis.  Hgb 8.0. -Hgb of 4.5, platelet <5 and WBC 2.8 -S/p  IVIG, Nplate, steroids, 1 unit of PRBC and splenectomy with appropriate vaccines on 9/15 -General surgery following -She may need hematology follow-up outpatient   Sepsis due to HAP/large exudative left pleural effusion/MRSE bacteremia -Zosyn>> cefepime, Flagyl and vancomycin>>> 09/29/2020 per ID. -Patient may need extended course of antibiotic for her loculated pleural effusion -See below for pleural effusion.  Exudative left pleural effusion-s/p left-Thora on 9/19 with removal of 500 cc.  No culture obtained.  Subsequent CT on 9/21 with partially loculated left pleural effusion with associated areas of atelectasis in the lungs.  Reportedly, "too small for Thora or chest tube per IR".  On exam, very diminished aeration with crackles over  left lower lung.  About 250 cc on incentive spirometry.  Continues to require up to 4 L.  Rising leukocytosis as well.  Antibiotics as above.  Repeat CXR without significant change but improved aeration of the right. -Discussed with CVTS, Dr. Cliffton Asters who recommended pulmonary consult -Pulmonology consulted.  Acute respiratory failure with  hypoxia-multifactorial as above.  Carries history of chronic hypoxic RF but she says she never used oxygen.  Currently saturating at 98-100% on 4 L.  -Wean oxygen as able -Encourage IS/OOB/PT/OT -Antibiotics as above -Pulmonology consulted  Acute on chronic diastolic CHF: reportedly out of her Lasix for 1 month POA.  It appears that she was taking 160 mg of Lasix daily.  Restarted on p.o. Lasix 40 mg twice daily.  Net -12 L.  Weight downtrending.  Creatinine improved.  BNP persistently high at 1000. -Continue Lasix 40 mg twice daily -Monitor fluid status, renal functions and electrolytes  Cecal wall thickening: Noted on CT abdomen and pelvis.  GI consulted.   -Colonoscopy once she is more stable.  Time was not specified   Essential hypertension: Normotensive.   -Continue Toprol-XL.   -Continue holding home Imdur, amlodipine.   AKI on CKD IIIB/obstructive uropathy-Baseline about 1.51.  AKI resolved. Recent Labs    09/21/20 0226 09/22/20 0752 09/22/20 2346 09/23/20 0939 09/23/20 2008 09/24/20 0342 09/25/20 0358 09/26/20 0227 09/27/20 0336 09/28/20 0334  BUN 43* 46* 46* 45* 44* 41* 36* 32* 30* 29*  CREATININE 2.05* 2.20* 2.10* 1.99* 2.14* 2.06* 1.71* 1.47* 1.42* 1.31*  -Continue monitoring   Controlled DM-2 with hyperglycemia: A1c 5.5% but after PRBC.  Was 6.2% in 06/2020. Recent Labs  Lab 09/27/20 1556 09/27/20 2010 09/28/20 0003 09/28/20 0410 09/28/20 0831  GLUCAP 144* 162* 138* 99 106*  -Continue SSI-moderate.   Debility/physical deconditioning/right BKA: Has a wheelchair at home. -Therapy recommended CIR   Hyponatremia/hypokalemia: Resolved.  Leukocytosis/thrombocytosis: Continues to rise.  Multiple potential causes including splenectomy and infectious process -Management as above.   Left thyroid nodule: 5.7 cm.  TSH 6.8.  -May need FNA outpatient.  Body mass index is 37.84 kg/m.         DVT prophylaxis:  SCDs Start: 09/12/20 1951    On subcu  Lovenox  Code Status: Full code Family Communication: Patient and/or RN. Available if any question.  Level of care: Med-Surg Status is: Inpatient  Remains inpatient appropriate because:Ongoing diagnostic testing needed not appropriate for outpatient work up, Unsafe d/c plan, IV treatments appropriate due to intensity of illness or inability to take PO, and Inpatient level of care appropriate due to severity of illness  Dispo: The patient is from: Home              Anticipated d/c is to: CIR              Patient currently is not medically stable to d/c.   Difficult to place patient No       Consultants:  General surgery Infectious disease Hematology/oncology CVTS over the phone Pulmonology   Sch Meds:  Scheduled Meds:  bethanechol  10 mg Oral TID   enoxaparin (LOVENOX) injection  50 mg Subcutaneous Q24H   folic acid  2 mg Oral Daily   furosemide  40 mg Oral BID   gabapentin  400 mg Oral BID   insulin aspart  0-15 Units Subcutaneous Q4H   metoprolol succinate  25 mg Oral Daily   pantoprazole  40 mg Oral Daily   rosuvastatin  20 mg Oral Daily  Continuous Infusions:  ceFEPime (MAXIPIME) IV 2 g (09/28/20 0239)   vancomycin 1,250 mg (09/27/20 1145)   PRN Meds:.acetaminophen, HYDROmorphone (DILAUDID) injection, loperamide, ondansetron (ZOFRAN) IV, oxyCODONE, polyvinyl alcohol  Antimicrobials: Anti-infectives (From admission, onward)    Start     Dose/Rate Route Frequency Ordered Stop   09/27/20 1045  vancomycin (VANCOREADY) IVPB 1750 mg/350 mL  Status:  Discontinued        1,750 mg 175 mL/hr over 120 Minutes Intravenous Every 24 hours 09/27/20 1040 09/27/20 1043   09/27/20 1045  vancomycin (VANCOREADY) IVPB 1250 mg/250 mL        1,250 mg 166.7 mL/hr over 90 Minutes Intravenous Every 24 hours 09/27/20 1043     09/25/20 1200  vancomycin (VANCOREADY) IVPB 1750 mg/350 mL  Status:  Discontinued        1,750 mg 175 mL/hr over 120 Minutes Intravenous Every 48 hours  09/23/20 1056 09/27/20 1040   09/23/20 1400  ceFEPIme (MAXIPIME) 2 g in sodium chloride 0.9 % 100 mL IVPB        2 g 200 mL/hr over 30 Minutes Intravenous Every 12 hours 09/23/20 1047 09/29/20 2359   09/23/20 1400  metroNIDAZOLE (FLAGYL) tablet 500 mg  Status:  Discontinued        500 mg Oral Every 12 hours 09/23/20 1047 09/24/20 1028   09/23/20 1200  vancomycin (VANCOREADY) IVPB 2000 mg/400 mL        2,000 mg 200 mL/hr over 120 Minutes Intravenous  Once 09/23/20 1047 09/23/20 1326   09/21/20 1400  piperacillin-tazobactam (ZOSYN) IVPB 3.375 g  Status:  Discontinued        3.375 g 12.5 mL/hr over 240 Minutes Intravenous Every 8 hours 09/21/20 1122 09/23/20 1047   09/18/20 1115  cefoTEtan (CEFOTAN) 2 g in sodium chloride 0.9 % 100 mL IVPB        2 g 200 mL/hr over 30 Minutes Intravenous  Once 09/18/20 1112 09/18/20 1250   09/18/20 1114  sodium chloride 0.9 % with cefoTEtan (CEFOTAN) ADS Med       Note to Pharmacy: Janene Harvey   : cabinet override      09/18/20 1114 09/18/20 1253        I have personally reviewed the following labs and images: CBC: Recent Labs  Lab 09/23/20 2008 09/24/20 0342 09/25/20 0358 09/26/20 0227 09/27/20 0336 09/28/20 0334  WBC 17.9* 17.0* 14.2* 16.8* 15.6* 19.2*  NEUTROABS 14.9* 12.6* 9.6* 11.8* 10.9*  --   HGB 8.4* 9.2* 9.0* 8.1* 8.0* 8.0*  HCT 28.7* 31.9* 29.7* 27.5* 27.7* 28.0*  MCV 87.2 87.4 85.8 86.5 86.8 86.7  PLT 723* 757* 790* 746* 755* 810*   BMP &GFR Recent Labs  Lab 09/22/20 0752 09/22/20 2346 09/24/20 0342 09/25/20 0358 09/26/20 0227 09/27/20 0336 09/28/20 0334  NA 129*   < > 135 137 136 137 138  K 4.1   < > 3.9 3.6 3.5 3.5 4.1  CL 96*   < > 101 103 103 104 105  CO2 23   < > GLUCOSE 93   < > 122* 80 109* 103* 103*  BUN 46*   < > 41* 36* 32* 30* 29*  CREATININE 2.20*   < > 2.06* 1.71* 1.47* 1.42* 1.31*  CALCIUM 6.3*   < > 7.5* 7.9* 7.7* 7.9* 8.1*  MG 1.3*  --   --   --  1.7  --  1.7  PHOS  --   --   --   --    --   --  2.4*   < > = values in this interval not displayed.   Estimated Creatinine Clearance: 63.3 mL/min (A) (by C-G formula based on SCr of 1.31 mg/dL (H)). Liver & Pancreas: Recent Labs  Lab 09/22/20 2346 09/23/20 0939 09/23/20 2008 09/24/20 0342 09/25/20 0358 09/28/20 0334  AST 23 33 28 24 17   --   ALT 16 15 16 16 13   --   ALKPHOS 67 69 75 83 74  --   BILITOT 0.4 0.6 0.5 0.6 0.7  --   PROT 5.8* 5.9* 6.2* 6.4* 6.0*  --   ALBUMIN 2.0* 2.0* 2.0* 2.2* 2.1* 2.1*   Recent Labs  Lab 09/22/20 1825  AMYLASE 75   No results for input(s): AMMONIA in the last 168 hours. Diabetic: Recent Labs    09/28/20 0334  HGBA1C 5.5   Recent Labs  Lab 09/27/20 1556 09/27/20 2010 09/28/20 0003 09/28/20 0410 09/28/20 0831  GLUCAP 144* 162* 138* 99 106*   Cardiac Enzymes: No results for input(s): CKTOTAL, CKMB, CKMBINDEX, TROPONINI in the last 168 hours. No results for input(s): PROBNP in the last 8760 hours. Coagulation Profile: Recent Labs  Lab 09/24/20 1035  INR 1.3*   Thyroid Function Tests: Recent Labs    09/26/20 0915  TSH 6.884*   Lipid Profile: No results for input(s): CHOL, HDL, LDLCALC, TRIG, CHOLHDL, LDLDIRECT in the last 72 hours. Anemia Panel: Recent Labs    09/28/20 0334  VITAMINB12 177*  FOLATE 26.3  FERRITIN 416*  TIBC 214*  IRON 31  RETICCTPCT 1.7   Urine analysis:    Component Value Date/Time   COLORURINE YELLOW 09/22/2020 1212   APPEARANCEUR CLEAR 09/22/2020 1212   LABSPEC <1.005 (L) 09/22/2020 1212   PHURINE 5.5 09/22/2020 1212   GLUCOSEU NEGATIVE 09/22/2020 1212   HGBUR NEGATIVE 09/22/2020 1212   BILIRUBINUR NEGATIVE 09/22/2020 1212   KETONESUR NEGATIVE 09/22/2020 1212   PROTEINUR NEGATIVE 09/22/2020 1212   UROBILINOGEN 2.0 (H) 07/08/2011 0051   NITRITE NEGATIVE 09/22/2020 1212   LEUKOCYTESUR SMALL (A) 09/22/2020 1212   Sepsis Labs: Invalid input(s): PROCALCITONIN, LACTICIDVEN  Microbiology: Recent Results (from the past 240  hour(s))  Culture, blood (routine x 2)     Status: Abnormal   Collection Time: 09/21/20 10:57 AM   Specimen: BLOOD  Result Value Ref Range Status   Specimen Description BLOOD SITE NOT SPECIFIED  Final   Special Requests   Final    BOTTLES DRAWN AEROBIC AND ANAEROBIC Blood Culture adequate volume   Culture  Setup Time   Final    AEROBIC BOTTLE ONLY GRAM POSITIVE COCCI IN CLUSTERS CRITICAL VALUE NOTED.  VALUE IS CONSISTENT WITH PREVIOUSLY REPORTED AND CALLED VALUE. Performed at [facility], [address], [place], [address] [place]    Culture STAPHYLOCOCCUS EPIDERMIDIS (A)  Final   Report Status 09/26/2020 FINAL  Final   Organism ID, Bacteria STAPHYLOCOCCUS EPIDERMIDIS  Final      Susceptibility   Staphylococcus epidermidis - MIC*    CIPROFLOXACIN <=0.5 SENSITIVE Sensitive     ERYTHROMYCIN >=8 RESISTANT Resistant     GENTAMICIN <=0.5 SENSITIVE Sensitive     OXACILLIN >=4 RESISTANT Resistant     TETRACYCLINE >=16 RESISTANT Resistant     VANCOMYCIN 2 SENSITIVE Sensitive     TRIMETH/SULFA <=10 SENSITIVE Sensitive     CLINDAMYCIN <=0.25 SENSITIVE Sensitive     RIFAMPIN <=0.5 SENSITIVE Sensitive     Inducible Clindamycin NEGATIVE Sensitive     * STAPHYLOCOCCUS EPIDERMIDIS  Culture, blood (routine x 2)  Status: Abnormal   Collection Time: 09/21/20 11:20 AM   Specimen: BLOOD RIGHT HAND  Result Value Ref Range Status   Specimen Description BLOOD RIGHT HAND  Final   Special Requests   Final    BOTTLES DRAWN AEROBIC ONLY Blood Culture adequate volume   Culture  Setup Time   Final    GRAM POSITIVE COCCI IN CLUSTERS AEROBIC BOTTLE ONLY CRITICAL RESULT CALLED TO, READ BACK BY AND VERIFIED WITHCeledonio Miyamoto [facility] 4403 09/22/20 A BROWNING Performed at [facility], [address], [place], [place] [address]    Culture STAPHYLOCOCCUS EPIDERMIDIS (A)  Final   Report Status 09/24/2020 FINAL  Final   Organism ID, Bacteria STAPHYLOCOCCUS EPIDERMIDIS  Final      Susceptibility    Staphylococcus epidermidis - MIC*    CIPROFLOXACIN <=0.5 SENSITIVE Sensitive     ERYTHROMYCIN >=8 RESISTANT Resistant     GENTAMICIN <=0.5 SENSITIVE Sensitive     OXACILLIN >=4 RESISTANT Resistant     TETRACYCLINE >=16 RESISTANT Resistant     VANCOMYCIN 2 SENSITIVE Sensitive     TRIMETH/SULFA <=10 SENSITIVE Sensitive     CLINDAMYCIN <=0.25 SENSITIVE Sensitive     RIFAMPIN <=0.5 SENSITIVE Sensitive     Inducible Clindamycin NEGATIVE Sensitive     * STAPHYLOCOCCUS EPIDERMIDIS  Blood Culture ID Panel (Reflexed)     Status: Abnormal   Collection Time: 09/21/20 11:20 AM  Result Value Ref Range Status   Enterococcus faecalis NOT DETECTED NOT DETECTED Final   Enterococcus Faecium NOT DETECTED NOT DETECTED Final   Listeria monocytogenes NOT DETECTED NOT DETECTED Final   Staphylococcus species DETECTED (A) NOT DETECTED Final    Comment: CRITICAL RESULT CALLED TO, READ BACK BY AND VERIFIED WITH: Celedonio Miyamoto PHARMD 1627 09/22/20 A BROWNING    Staphylococcus aureus (BCID) NOT DETECTED NOT DETECTED Final   Staphylococcus epidermidis DETECTED (A) NOT DETECTED Final    Comment: Methicillin (oxacillin) resistant coagulase negative staphylococcus. Possible blood culture contaminant (unless isolated from more than one blood culture draw or clinical case suggests pathogenicity). No antibiotic treatment is indicated for blood  culture contaminants. CRITICAL RESULT CALLED TO, READ BACK BY AND VERIFIED WITH: Celedonio Miyamoto PHARMD 1627 09/22/20 A BROWNING    Staphylococcus lugdunensis NOT DETECTED NOT DETECTED Final   Streptococcus species NOT DETECTED NOT DETECTED Final   Streptococcus agalactiae NOT DETECTED NOT DETECTED Final   Streptococcus pneumoniae NOT DETECTED NOT DETECTED Final   Streptococcus pyogenes NOT DETECTED NOT DETECTED Final   A.calcoaceticus-baumannii NOT DETECTED NOT DETECTED Final   Bacteroides fragilis NOT DETECTED NOT DETECTED Final   Enterobacterales NOT DETECTED NOT DETECTED  Final   Enterobacter cloacae complex NOT DETECTED NOT DETECTED Final   Escherichia coli NOT DETECTED NOT DETECTED Final   Klebsiella aerogenes NOT DETECTED NOT DETECTED Final   Klebsiella oxytoca NOT DETECTED NOT DETECTED Final   Klebsiella pneumoniae NOT DETECTED NOT DETECTED Final   Proteus species NOT DETECTED NOT DETECTED Final   Salmonella species NOT DETECTED NOT DETECTED Final   Serratia marcescens NOT DETECTED NOT DETECTED Final   Haemophilus influenzae NOT DETECTED NOT DETECTED Final   Neisseria meningitidis NOT DETECTED NOT DETECTED Final   Pseudomonas aeruginosa NOT DETECTED NOT DETECTED Final   Stenotrophomonas maltophilia NOT DETECTED NOT DETECTED Final   Candida albicans NOT DETECTED NOT DETECTED Final   Candida auris NOT DETECTED NOT DETECTED Final   Candida glabrata NOT DETECTED NOT DETECTED Final   Candida krusei NOT DETECTED NOT DETECTED Final   Candida  parapsilosis NOT DETECTED NOT DETECTED Final   Candida tropicalis NOT DETECTED NOT DETECTED Final   Cryptococcus neoformans/gattii NOT DETECTED NOT DETECTED Final   Methicillin resistance mecA/C DETECTED (A) NOT DETECTED Final    Comment: CRITICAL RESULT CALLED TO, READ BACK BY AND VERIFIED WITHCeledonio Miyamoto [facility] 3875 09/22/20 A BROWNING Performed at [facility], [address], [place], [place] [address]   Culture, body fluid w Gram Stain-bottle     Status: None (Preliminary result)   Collection Time: 09/22/20  4:51 PM   Specimen: Pleura  Result Value Ref Range Status   Specimen Description PLEURAL  Final   Special Requests NONE  Final   Culture   Final    NO GROWTH 3 DAYS Performed at [facility], [address] [place], [place] [address]    Report Status PENDING  Incomplete  Gram stain     Status: None   Collection Time: 09/22/20  4:51 PM   Specimen: Pleura  Result Value Ref Range Status   Specimen Description PLEURAL  Final   Special Requests NONE  Final   Gram Stain   Final    ABUNDANT  WBC PRESENT, PREDOMINANTLY PMN NO ORGANISMS SEEN Performed at [facility], [address] [place], [place] [address]    Report Status 09/22/2020 FINAL  Final  Culture, blood (routine x 2)     Status: None (Preliminary result)   Collection Time: 09/24/20  9:55 AM   Specimen: BLOOD LEFT HAND  Result Value Ref Range Status   Specimen Description BLOOD LEFT HAND  Final   Special Requests   Final    BOTTLES DRAWN AEROBIC AND ANAEROBIC Blood Culture adequate volume   Culture   Final    NO GROWTH 3 DAYS Performed at [facility], [address] [place], [place] [address]    Report Status PENDING  Incomplete  Culture, blood (routine x 2)     Status: None (Preliminary result)   Collection Time: 09/24/20 10:05 AM   Specimen: BLOOD RIGHT HAND  Result Value Ref Range Status   Specimen Description BLOOD RIGHT HAND  Final   Special Requests   Final    BOTTLES DRAWN AEROBIC AND ANAEROBIC Blood Culture adequate volume   Culture   Final    NO GROWTH 3 DAYS Performed at [facility], [address], [place], [place] [address]    Report Status PENDING  Incomplete    Radiology Studies: DG Chest 2 View  Result Date: 09/28/2020 CLINICAL DATA:  Hypoxemia. EXAM: CHEST - 2 VIEW COMPARISON:  09/23/2020.  CT, 09/24/2020. FINDINGS: Stable mild enlargement of the cardiopericardial silhouette. Persistent opacity at the left lung base obscures portions of the left heart border and left hemidiaphragm. Left perihilar patchy airspace opacity noted on the prior radiograph has improved. There is mild opacity at the medial right lung base consistent with atelectasis. Remainder of the lungs is clear. No pneumothorax. Left inferior hemithorax chest tube is stable from the prior exams. IMPRESSION: 1. Mild interval improvement. Opacity noted in the left perihilar lung has decreased from the prior chest radiograph and chest CT. Opacity at the left lung base is without significant change. No change in  the left chest tube. 2. No pneumothorax. Electronically Signed   By: Amie Portland M.D.   On: 09/28/2020 10:57      Raygen Dahm T. Nakyia Dau [facility]  If 7PM-7AM, please contact night-coverage [email or web address] 09/28/2020, 12:01 PM

## 2020-09-28 NOTE — Progress Notes (Signed)
Id brief note   Doing well from pna/pleural effusion standpoint   -agree with continue oral abx given effusion -on 9/26 when finished with 7 day iv abx can transition to another 3 weeks amox-clav -ok to f/u pulm outpatient -id to sign off

## 2020-09-28 NOTE — Plan of Care (Signed)
  Problem: Education: Goal: Knowledge of General Education information will improve Description: Including pain rating scale, medication(s)/side effects and non-pharmacologic comfort measures Outcome: Progressing   Problem: Clinical Measurements: Goal: Respiratory complications will improve Outcome: Progressing   Problem: Nutrition: Goal: Adequate nutrition will be maintained Outcome: Progressing   

## 2020-09-28 NOTE — Progress Notes (Signed)
Pt attempted to void using bedpan but missed and soiled bed. Pt cleaned and linens changed.

## 2020-09-28 NOTE — Consult Note (Signed)
NAME:  [name], MRN:  [id number], DOB:  1965-04-16, LOS: 78 ADMISSION DATE:  09/12/2020, CONSULTATION DATE:  09/28/20 REFERRING MD:  Cyndia Skeeters - [facility], CHIEF COMPLAINT:  Hypoxic respiratory failure    History of Present Illness:  55 yo F PMH ITP, DM2, CKD III,  dCHF, ICH, PVD s/p R BKA,neurogenic bladder, physical deconditioning presented to ED 9/9 with SOB, progressive fatigue. Labs revealed pancytopenia. Admitted to [facility] for ITP / pancytopenia -- treated with transfusion, IVIG, IV Fe, EPO, Nplate, steroids and ultimately underwent splenectomy 9/15.   Post op course has been complicated by CHF exacerbation, sepsis due to HAP, complicated/likely parapneumonic effusion (s/p thora 9/19 with IR -- exudative, gram stain and cx negative), MRSE bacteremia for which pt is receiving Penicillin G through 9/26   On 9/25 PCCM consulted due to progressive SOB and concerns for persistent L sided effusion.  Some emesis earlier today but no frank aspiration or productive cough.  Pertinent  Medical History  CKD III  DM2 dCHF ICH PVD R BKA  Significant Hospital Events: Including procedures, antibiotic start and stop dates in addition to other pertinent events   9/9 admit to [facility] with ITP / pancytopenia. Heme consulted 9/12 CCS consulted  9/15 splenectomy 9/19 thora for L pleural effusion  9/21 ID consult for MRSE bacteremia 9/25 CCM consult for persistent pleural effusion, SOB hypoxia   Interim History / Subjective:   PCCM consulted for SOB, concern for persistent L pleural effusion   Objective   Blood pressure (!) 168/57, pulse 83, temperature 98.7 F (37.1 C), temperature source Oral, resp. rate 18, height '5\' 7"'  (1.702 m), weight 109.6 kg, SpO2 98 %.        Intake/Output Summary (Last 24 hours) at 09/28/2020 1251 Last data filed at 09/28/2020 1155 Gross per 24 hour  Intake 1880 ml  Output 1200 ml  Net 680 ml   Filed Weights   09/25/20 0500 09/26/20 0100 09/27/20 0357  Weight: 112.9 kg  109.6 kg 109.6 kg    Examination: General appearance: 55 y.o., female, NAD, chronically ill appearing Eyes: anicteric sclerae, moist conjunctivae; no lid-lag; PERRL, tracking appropriately HENT: NCAT; oropharynx, dry MM Lungs: diminished L>R  no crackles, no wheeze, with normal respiratory effort CV: RRR, no MRGs  Abdomen: Soft, non-tender; non-distended, BS present  Extremities: Anasarcic, warm Skin: Normal temperature, turgor and texture; no rash Psych: Appropriate affect Neuro: Alert and oriented to person and place, no focal deficit    Resolved Hospital Problem list     Assessment & Plan:   # Acute hypoxic respiratory failure  # HAP with dense LLL consolidation and distal endobronchial occlusion # Exudative, likely complicated parapneumonic left pleural effusion (s/p thora 9/19)  # Anasarca # Asplenic patient Effusion is too small to safely place pigtail/thora at bedside.  P -would increase diuretic and shoot for 1L net negative fluid balance -would position left lung up for postural drainage and to improve V/Q matching -bronchodilators and CPT four times daily while awake -ABX per primary/ID -if failing to clinically improve/persistently febrile then would repeat CT Chest, procalcitonin. If there is still significant effusion, procal still elevated then IR for pigtail. If instead it's mostly LLL endobronchial obstruction and LLL consolidation then we could consider bronch but yield for infection with BAL would be low with 3+ days of broad ABX and she is low risk for lung CA and so I don't think airway inspection would be that helpful for her.  -if instead stable/improving agree with ID  for long course of augmentin when ready to be discharged with pulmonary follow up which we can arrange -supplemental O2 -PT/OT   ITP s/p splenectomy (POD 10)  -post op per CCS  Acute on chronic diastolic HF -BNP persistently elevated P -cont diuresis   AKI on CKD III  -obstructive  uropathy  P -trend renal indices    Best Practice (right click and "Reselect all SmartList Selections" daily)   Per [facility]  Labs   CBC: Recent Labs  Lab 09/23/20 2008 09/24/20 0342 09/25/20 0358 09/26/20 0227 09/27/20 0336 09/28/20 0334  WBC 17.9* 17.0* 14.2* 16.8* 15.6* 19.2*  NEUTROABS 14.9* 12.6* 9.6* 11.8* 10.9*  --   HGB 8.4* 9.2* 9.0* 8.1* 8.0* 8.0*  HCT 28.7* 31.9* 29.7* 27.5* 27.7* 28.0*  MCV 87.2 87.4 85.8 86.5 86.8 86.7  PLT 723* 757* 790* 746* 755* 810*    Basic Metabolic Panel: Recent Labs  Lab 09/22/20 0752 09/22/20 2346 09/24/20 0342 09/25/20 0358 09/26/20 0227 09/27/20 0336 09/28/20 0334  NA 129*   < > 135 137 136 137 138  K 4.1   < > 3.9 3.6 3.5 3.5 4.1  CL 96*   < > 101 103 103 104 105  CO2 23   < > '22 24 25 26 25  ' GLUCOSE 93   < > 122* 80 109* 103* 103*  BUN 46*   < > 41* 36* 32* 30* 29*  CREATININE 2.20*   < > 2.06* 1.71* 1.47* 1.42* 1.31*  CALCIUM 6.3*   < > 7.5* 7.9* 7.7* 7.9* 8.1*  MG 1.3*  --   --   --  1.7  --  1.7  PHOS  --   --   --   --   --   --  2.4*   < > = values in this interval not displayed.   GFR: Estimated Creatinine Clearance: 63.3 mL/min (A) (by C-G formula based on SCr of 1.31 mg/dL (H)). Recent Labs  Lab 09/22/20 0854 09/22/20 1216 09/22/20 2346 09/23/20 2033 09/23/20 2246 09/24/20 0342 09/25/20 0358 09/26/20 0227 09/27/20 0336 09/28/20 0334  PROCALCITON  --  1.62  --   --   --   --   --   --   --   --   WBC  --   --    < >  --   --    < > 14.2* 16.8* 15.6* 19.2*  LATICACIDVEN 0.6 0.9  --  1.0 1.1  --   --   --   --   --    < > = values in this interval not displayed.    Liver Function Tests: Recent Labs  Lab 09/22/20 2346 09/23/20 0939 09/23/20 2008 09/24/20 0342 09/25/20 0358 09/28/20 0334  AST 23 33 '28 24 17  ' --   ALT '16 15 16 16 13  ' --   ALKPHOS 67 69 75 83 74  --   BILITOT 0.4 0.6 0.5 0.6 0.7  --   PROT 5.8* 5.9* 6.2* 6.4* 6.0*  --   ALBUMIN 2.0* 2.0* 2.0* 2.2* 2.1* 2.1*   Recent Labs  Lab  09/22/20 1825  AMYLASE 75   No results for input(s): AMMONIA in the last 168 hours.  ABG    Component Value Date/Time   PHART 7.265 (L) 09/23/2020 1928   PCO2ART 46.5 09/23/2020 1928   PO2ART 171 (H) 09/23/2020 1928   HCO3 20.2 09/23/2020 1928   TCO2 31 09/18/2020 1341   ACIDBASEDEF 5.5 (H)  09/23/2020 1928   O2SAT 99.6 09/23/2020 1928     Coagulation Profile: Recent Labs  Lab 09/24/20 1035  INR 1.3*    Cardiac Enzymes: No results for input(s): CKTOTAL, CKMB, CKMBINDEX, TROPONINI in the last 168 hours.  HbA1C: HbA1c, POC (prediabetic range)  Date/Time Value Ref Range Status  08/22/2019 04:39 PM 5.5 (A) 5.7 - 6.4 % Final   HbA1c, POC (controlled diabetic range)  Date/Time Value Ref Range Status  08/22/2019 04:39 PM 5.5 0.0 - 7.0 % Final   HbA1c POC (<> result, manual entry)  Date/Time Value Ref Range Status  08/22/2019 04:39 PM 5.5 4.0 - 5.6 % Final   Hgb A1c MFr Bld  Date/Time Value Ref Range Status  09/28/2020 03:34 AM 5.5 4.8 - 5.6 % Final    Comment:    (NOTE) Pre diabetes:          5.7%-6.4%  Diabetes:              >6.4%  Glycemic control for   <7.0% adults with diabetes   06/13/2020 05:00 AM 6.2 (H) 4.8 - 5.6 % Final    Comment:    (NOTE)         Prediabetes: 5.7 - 6.4         Diabetes: >6.4         Glycemic control for adults with diabetes: <7.0     CBG: Recent Labs  Lab 09/27/20 2010 09/28/20 0003 09/28/20 0410 09/28/20 0831 09/28/20 1208  GLUCAP 162* 138* 99 106* 140*    Review of Systems:   12 point ROS negative except as in HPI  Past Medical History:  She,  has a past medical history of Acute exacerbation of CHF (congestive heart failure) ([place]) (04/11/2019), Anemia, Arthritis, Back pain, Chronic indwelling Foley catheter (03/04/2017), Diabetic foot ulcer associated with type 2 diabetes mellitus ([place]), Hypertension, Intracerebral hemorrhage ([place]) (As a teenager ), Neuropathy, Stroke ([place]) (~ 1982), and Type II diabetes mellitus ([place]).    Surgical History:   Past Surgical History:  Procedure Laterality Date   AMPUTATION  11/24/2010   Procedure: AMPUTATION FOOT;  Surgeon: Wylene Simmer, MD;  Location: [place];  Service: Orthopedics;  Laterality: Right;  Right  FOOT TRANS METATARSAL AMPUTATION   AMPUTATION  07/02/2011   Procedure: AMPUTATION BELOW KNEE;  Surgeon: Wylene Simmer, MD;  Location: [place];  Service: Orthopedics;  Laterality: Right;   APPLICATION OF WOUND VAC  07/02/2011   Procedure: APPLICATION OF WOUND VAC;  Surgeon: Wylene Simmer, MD;  Location: [place];  Service: Orthopedics;  Laterality: Right;   BRAIN SURGERY  1980's   Previous brain surgery in [place], [place]  2004; 2006   COLONOSCOPY N/A 09/17/2015   Procedure: COLONOSCOPY;  Surgeon: Wilford Corner, MD;  Location: [facility] ENDOSCOPY;  Service: Endoscopy;  Laterality: N/A;   ESOPHAGOGASTRODUODENOSCOPY N/A 09/17/2015   Procedure: ESOPHAGOGASTRODUODENOSCOPY (EGD);  Surgeon: Wilford Corner, MD;  Location[facility] ENDOSCOPY;  Service: Endoscopy;  Laterality: N/A;   I & D EXTREMITY  11/24/2010   Procedure: IRRIGATION AND DEBRIDEMENT EXTREMITY;  Surgeon: Wylene Simmer, MD;  Location: [place];  Service: Orthopedics;  Laterality: Left;  Debriedment of left plantar ulcer and trimming of toenails   I & D EXTREMITY  07/02/2011   Procedure: IRRIGATION AND DEBRIDEMENT EXTREMITY;  Surgeon: Wylene Simmer, MD;  Location: [place];  Service: Orthopedics;  Laterality: Left;  I & D of Left Foot   I & D EXTREMITY  07/06/2011   Procedure: IRRIGATION AND  DEBRIDEMENT EXTREMITY;  Surgeon: Wylene Simmer, MD;  Location: [place];  Service: Orthopedics;  Laterality: Right;  IRRIGATION/DEBRIDEMENT RIGHT BELOW KNEE AMPUTATION   IR THORACENTESIS ASP PLEURAL SPACE W/IMG GUIDE  09/22/2020   LAPAROSCOPIC SPLENECTOMY N/A 09/18/2020   Procedure: HAND ASSISTED LAPAROSCOPIC SPLENECTOMY;  Surgeon: Stark Klein, MD;  Location: [place];  Service: General;  Laterality: N/A;  [place]  2006      Social History:   reports that she has never smoked. She has never used smokeless tobacco. She reports current alcohol use of about 2.0 standard drinks per week. She reports that she does not use drugs.   Family History:  Her family history includes Cancer in her father; Diabetes in her brother, father, mother, and son; Hyperlipidemia in her father and mother; Hypertension in her father and mother; Peripheral Artery Disease in her brother.   Allergies No Known Allergies   Home Medications  Prior to Admission medications   Medication Sig Start Date End Date Taking? Authorizing Provider  amLODipine (NORVASC) 5 MG tablet Take 1 tablet (5 mg total) by mouth daily. 01/28/20 01/27/21 Yes Vevelyn Francois, NP  aspirin EC 81 MG tablet Take 81 mg by mouth daily as needed for moderate pain. Swallow whole.   Yes [provider]  furosemide (LASIX) 40 MG tablet Take 160 mg by mouth daily. 04/02/20  Yes [provider]  gabapentin (NEURONTIN) 400 MG capsule Take 1 capsule (400 mg total) by mouth 2 (two) times daily. 01/28/20 01/27/21 Yes Vevelyn Francois, NP  isosorbide mononitrate (IMDUR) 30 MG 24 hr tablet Take 1 tablet (30 mg total) by mouth daily. 01/28/20 01/22/21 Yes Vevelyn Francois, NP  metFORMIN (GLUCOPHAGE) 500 MG tablet Take 500 mg by mouth 2 (two) times daily. 06/16/20  Yes [provider]  metoprolol succinate (TOPROL-XL) 50 MG 24 hr tablet Take 1 tablet (50 mg total) by mouth daily. Take with or immediately following a meal. 01/28/20 01/22/21 Yes King, Diona Foley, NP  rosuvastatin (CRESTOR) 20 MG tablet Take 1 tablet (20 mg total) by mouth daily. 01/28/20 01/22/21 Yes Vevelyn Francois, NP  Blood Glucose Monitoring Suppl (PRODIGY AUTOCODE BLOOD GLUCOSE) w/Device KIT 1 kit by Does not apply route in the morning, at noon, in the evening, and at bedtime. 06/06/19 06/05/20  Vevelyn Francois, NP  ferrous gluconate (FERGON) 324 MG tablet Take 1 tablet (324 mg total) by mouth daily with  breakfast. Patient not taking: No sig reported 06/16/20   Shawna Clamp, MD  polyvinyl alcohol (LIQUIFILM TEARS) 1.4 % ophthalmic solution Place 1 drop into both eyes as needed for dry eyes. Patient not taking: No sig reported    [provider]

## 2020-09-29 DIAGNOSIS — D509 Iron deficiency anemia, unspecified: Secondary | ICD-10-CM | POA: Diagnosis not present

## 2020-09-29 DIAGNOSIS — D693 Immune thrombocytopenic purpura: Secondary | ICD-10-CM | POA: Diagnosis not present

## 2020-09-29 DIAGNOSIS — R7881 Bacteremia: Secondary | ICD-10-CM | POA: Diagnosis not present

## 2020-09-29 DIAGNOSIS — D649 Anemia, unspecified: Secondary | ICD-10-CM | POA: Diagnosis not present

## 2020-09-29 LAB — GLUCOSE, CAPILLARY
Glucose-Capillary: 116 mg/dL — ABNORMAL HIGH (ref 70–99)
Glucose-Capillary: 118 mg/dL — ABNORMAL HIGH (ref 70–99)
Glucose-Capillary: 127 mg/dL — ABNORMAL HIGH (ref 70–99)
Glucose-Capillary: 139 mg/dL — ABNORMAL HIGH (ref 70–99)
Glucose-Capillary: 148 mg/dL — ABNORMAL HIGH (ref 70–99)
Glucose-Capillary: 151 mg/dL — ABNORMAL HIGH (ref 70–99)
Glucose-Capillary: 151 mg/dL — ABNORMAL HIGH (ref 70–99)

## 2020-09-29 LAB — RENAL FUNCTION PANEL
Albumin: 2.2 g/dL — ABNORMAL LOW (ref 3.5–5.0)
Anion gap: 9 (ref 5–15)
BUN: 25 mg/dL — ABNORMAL HIGH (ref 6–20)
CO2: 24 mmol/L (ref 22–32)
Calcium: 8.3 mg/dL — ABNORMAL LOW (ref 8.9–10.3)
Chloride: 106 mmol/L (ref 98–111)
Creatinine, Ser: 1.21 mg/dL — ABNORMAL HIGH (ref 0.44–1.00)
GFR, Estimated: 54 mL/min — ABNORMAL LOW (ref >60)
Glucose, Bld: 166 mg/dL — ABNORMAL HIGH (ref 70–99)
Phosphorus: 2.2 mg/dL — ABNORMAL LOW (ref 2.5–4.6)
Potassium: 3.8 mmol/L (ref 3.5–5.1)
Sodium: 139 mmol/L (ref 135–145)

## 2020-09-29 LAB — CBC
HCT: 28.3 % — ABNORMAL LOW (ref 36.0–46.0)
Hemoglobin: 8.3 g/dL — ABNORMAL LOW (ref 12.0–15.0)
MCH: 25.3 pg — ABNORMAL LOW (ref 26.0–34.0)
MCHC: 29.3 g/dL — ABNORMAL LOW (ref 30.0–36.0)
MCV: 86.3 fL (ref 80.0–100.0)
Platelets: 812 10*3/uL — ABNORMAL HIGH (ref 150–400)
RBC: 3.28 MIL/uL — ABNORMAL LOW (ref 3.87–5.11)
RDW: 23.6 % — ABNORMAL HIGH (ref 11.5–15.5)
WBC: 20.3 10*3/uL — ABNORMAL HIGH (ref 4.0–10.5)
nRBC: 0 % (ref 0.0–0.2)

## 2020-09-29 LAB — CULTURE, BODY FLUID W GRAM STAIN -BOTTLE: Culture: NO GROWTH

## 2020-09-29 LAB — CULTURE, BLOOD (ROUTINE X 2)
Culture: NO GROWTH
Culture: NO GROWTH
Special Requests: ADEQUATE
Special Requests: ADEQUATE

## 2020-09-29 LAB — MAGNESIUM: Magnesium: 1.5 mg/dL — ABNORMAL LOW (ref 1.7–2.4)

## 2020-09-29 MED ORDER — MAGNESIUM SULFATE 2 GM/50ML IV SOLN
2.0000 g | Freq: Once | INTRAVENOUS | Status: AC
  Administered 2020-09-29: 2 g via INTRAVENOUS
  Filled 2020-09-29: qty 50

## 2020-09-29 MED ORDER — AMOXICILLIN-POT CLAVULANATE 875-125 MG PO TABS
1.0000 | ORAL_TABLET | Freq: Two times a day (BID) | ORAL | Status: DC
  Administered 2020-09-29 – 2020-10-02 (×6): 1 via ORAL
  Filled 2020-09-29 (×6): qty 1

## 2020-09-29 NOTE — Progress Notes (Signed)
PROGRESS NOTE  [name] VQQ:[id number] DOB: Dec 16, 1965   PCP: Annita Brod, MD  Patient is from: Home.  DOA: 09/12/2020 LOS: 17  Chief complaints:  Chief Complaint  Patient presents with   Leg Swelling     Brief Narrative / Interim history: 55 year old F with PMH of ITP, DM-2, CKD-3, diastolic CHF, ICH, PVD, right BKA, neurogenic bladder/chronic Foley presenting with shortness of breath, fatigue, physical deconditioning and bruising for about a month, and admitted for ITP/pancytopenia with Hgb of 4.5, platelet <5 and WBC 2.8, and acute on chronic diastolic CHF.  Heme-onc consulted and she was treated with blood transfusion, IVIG, EPO, IV iron, Nplate, steroids and splenectomy with improvement in her pancytopenia.  She was also diuresed with IV Lasix for CHF exacerbation.  Hospital course noteworthy of sepsis/acute hypoxic RF in the setting of HAP/exudative loculated left pleural effusion/MRSE bacteremia.  ID 7 days of IV antibiotics followed by 3 weeks of Augmentin ending on 10/17.   PCCM and general surgery following.  Therapy recommended CIR.  Subjective: Seen and examined earlier this morning.  No major events overnight of this morning.  She says her breathing is better.  Still with dry cough.  Denies chest pain, nausea, vomiting or abdominal pain.  Objective: Vitals:   09/28/20 1600 09/28/20 1622 09/28/20 1926 09/29/20 0326  BP: (!) 175/62 (!) 155/51    Pulse: 87     Resp: 16  16   Temp: 98.4 F (36.9 C)  99.5 F (37.5 C)   TempSrc: Oral  Oral   SpO2: 100%     Weight:    108.8 kg  Height:        Intake/Output Summary (Last 24 hours) at 09/29/2020 1213 Last data filed at 09/29/2020 0400 Gross per 24 hour  Intake 1078.5 ml  Output 1300 ml  Net -221.5 ml   Filed Weights   09/26/20 0100 09/27/20 0357 09/29/20 0326  Weight: 109.6 kg 109.6 kg 108.8 kg    Examination:  GENERAL: No apparent distress.  Nontoxic. HEENT: MMM.  Vision and hearing grossly  intact.  NECK: Supple.  No apparent JVD.  RESP: 98 200% on 4 L.  No IWOB.  Improved aeration over LLL.  Crackles.  About 500 cc on IS. CVS:  RRR. Heart sounds normal.  ABD/GI/GU: BS+. Abd soft.  No significant tenderness.  Surgical wound clean.  JP drain with small purulent material MSK/EXT:  Moves extremities.  Right BKA.  Trace edema in LLE SKIN: Abdominal surgical wound appears clean NEURO: Awake and alert. Oriented appropriately.  No apparent focal neuro deficit. PSYCH: Calm. Normal affect.     Procedures:  9/15-splenectomy  Microbiology summarized: 9/9-COVID-19 and influenza PCR nonreactive. 9/18-blood culture with MRSE 9/21-repeat blood cultures NGTD.  Assessment & Plan: ITP/pancytopenia/symptomatic anemia-now with leukocytosis and thrombocytosis.  Hgb stable at 8.3. -Hgb of 4.5, platelet <5 and WBC 2.8 -S/p  IVIG, Nplate, steroids, 1 unit of PRBC and splenectomy with appropriate vaccines on 9/15 -General surgery following-following fluid amylase levels to exclude pancreatic leak -She may need hematology follow-up outpatient   Sepsis due to HAP/large exudative left pleural effusion/MRSE bacteremia -Zosyn>> cefepime, Flagyl and vancomycin> 9/26> p.o. Augmentin >>> 10/17 -See below for pleural effusion.  Exudative left pleural effusion-s/p left-Thora on 9/19 with removal of 500 cc.  Cultures negative.  Subsequent CT on 9/21 with partially loculated left pleural effusion with associated areas of atelectasis in the lungs.  She has crackles and diminished air over LLL but improving.  However, rising  leukocytosis.  Antibiotics as above.  Repeat CXR without significant change but improved aeration of the right. -Discussed with CVTS, Dr. Cliffton Asters who recommended pulmonary consult -Appreciate help by pulm-see their recommendations. -Continue antibiotics as above  Acute respiratory failure with hypoxia-multifactorial as above.  Carries history of chronic hypoxic RF but she says she  never used oxygen.  Currently saturating at 98-100% on 4 L.  -Wean oxygen as able -Encourage IS/OOB/PT/OT -Antibiotics as above -Appreciate help by pulm  Acute on chronic diastolic CHF: reportedly out of her Lasix for 1 month POA.  It appears that she was taking 160 mg of Lasix daily.  Restarted on p.o. Lasix 40 mg twice daily.  About 1.3 L UOP/24 hours.  Net -12 L.  Weight downtrending.  Creatinine improving.  BNP persistently high at 1000. -Continue Lasix 40 mg twice daily -Monitor fluid status, renal functions and electrolytes  Cecal wall thickening: Noted on CT abdomen and pelvis.  GI consulted.   -Colonoscopy once she is more stable.  GI to arrange, likely outpatient  Essential hypertension: Normotensive.   -Continue Toprol-XL.   -Continue holding home Imdur, amlodipine.   AKI on CKD IIIB/obstructive uropathy-Baseline about 1.51.  AKI resolved. Recent Labs    09/22/20 0752 09/22/20 2346 09/23/20 0939 09/23/20 2008 09/24/20 0342 09/25/20 0358 09/26/20 0227 09/27/20 0336 09/28/20 0334 09/29/20 0217  BUN 46* 46* 45* 44* 41* 36* 32* 30* 29* 25*  CREATININE 2.20* 2.10* 1.99* 2.14* 2.06* 1.71* 1.47* 1.42* 1.31* 1.21*  -Continue monitoring   Controlled DM-2 with hyperglycemia: A1c 5.5% but after PRBC.  Was 6.2% in 06/2020. Recent Labs  Lab 09/28/20 2015 09/29/20 0010 09/29/20 0446 09/29/20 0915 09/29/20 1210  GLUCAP 111* 139* 118* 127* 151*  -Continue SSI-moderate.   Debility/physical deconditioning/right BKA: Has a wheelchair at home. -Therapy recommended CIR   Hyponatremia: Resolved.  Hypomagnesemia: Mg 1.5. -IV magnesium sulfate 2 g x 1  Leukocytosis/thrombocytosis: Continues to rise.  Multiple potential causes including splenectomy and infectious process -Management as above.   Left thyroid nodule: 5.7 cm.  TSH 6.8.  -May need FNA outpatient.  Body mass index is 37.57 kg/m.         DVT prophylaxis:  SCDs Start: 09/12/20 1951    On subcu  Lovenox  Code Status: Full code Family Communication: Patient and/or RN. Available if any question.  Level of care: Med-Surg Status is: Inpatient  Remains inpatient appropriate because:Ongoing diagnostic testing needed not appropriate for outpatient work up, Unsafe d/c plan, IV treatments appropriate due to intensity of illness or inability to take PO, and Inpatient level of care appropriate due to severity of illness  Dispo: The patient is from: Home              Anticipated d/c is to: CIR              Patient currently is not medically stable to d/c.   Difficult to place patient No       Consultants:  General surgery Infectious disease Hematology/oncology CVTS over the phone Pulmonology   Sch Meds:  Scheduled Meds:  amoxicillin-clavulanate  1 tablet Oral Q12H   bethanechol  10 mg Oral TID   enoxaparin (LOVENOX) injection  50 mg Subcutaneous Q24H   folic acid  2 mg Oral Daily   furosemide  40 mg Oral BID   gabapentin  400 mg Oral BID   insulin aspart  0-15 Units Subcutaneous Q4H   metoprolol succinate  25 mg Oral Daily   pantoprazole  40 mg Oral Daily   rosuvastatin  20 mg Oral Daily   Continuous Infusions:  ceFEPime (MAXIPIME) IV 2 g (09/29/20 0311)   vancomycin 1,250 mg (09/28/20 1257)   PRN Meds:.acetaminophen, fentaNYL (SUBLIMAZE) injection, HYDROmorphone (DILAUDID) injection, loperamide, ondansetron (ZOFRAN) IV, oxyCODONE, polyvinyl alcohol  Antimicrobials: Anti-infectives (From admission, onward)    Start     Dose/Rate Route Frequency Ordered Stop   09/29/20 1800  amoxicillin-clavulanate (AUGMENTIN) 875-125 MG per tablet 1 tablet        1 tablet Oral Every 12 hours 09/29/20 0728 10/20/20 2159   09/27/20 1045  vancomycin (VANCOREADY) IVPB 1750 mg/350 mL  Status:  Discontinued        1,750 mg 175 mL/hr over 120 Minutes Intravenous Every 24 hours 09/27/20 1040 09/27/20 1043   09/27/20 1045  vancomycin (VANCOREADY) IVPB 1250 mg/250 mL        1,250 mg 166.7  mL/hr over 90 Minutes Intravenous Every 24 hours 09/27/20 1043 09/29/20 2359   09/25/20 1200  vancomycin (VANCOREADY) IVPB 1750 mg/350 mL  Status:  Discontinued        1,750 mg 175 mL/hr over 120 Minutes Intravenous Every 48 hours 09/23/20 1056 09/27/20 1040   09/23/20 1400  ceFEPIme (MAXIPIME) 2 g in sodium chloride 0.9 % 100 mL IVPB        2 g 200 mL/hr over 30 Minutes Intravenous Every 12 hours 09/23/20 1047 09/29/20 2359   09/23/20 1400  metroNIDAZOLE (FLAGYL) tablet 500 mg  Status:  Discontinued        500 mg Oral Every 12 hours 09/23/20 1047 09/24/20 1028   09/23/20 1200  vancomycin (VANCOREADY) IVPB 2000 mg/400 mL        2,000 mg 200 mL/hr over 120 Minutes Intravenous  Once 09/23/20 1047 09/23/20 1326   09/21/20 1400  piperacillin-tazobactam (ZOSYN) IVPB 3.375 g  Status:  Discontinued        3.375 g 12.5 mL/hr over 240 Minutes Intravenous Every 8 hours 09/21/20 1122 09/23/20 1047   09/18/20 1115  cefoTEtan (CEFOTAN) 2 g in sodium chloride 0.9 % 100 mL IVPB        2 g 200 mL/hr over 30 Minutes Intravenous  Once 09/18/20 1112 09/18/20 1250   09/18/20 1114  sodium chloride 0.9 % with cefoTEtan (CEFOTAN) ADS Med       Note to Pharmacy: Janene Harvey   : cabinet override      09/18/20 1114 09/18/20 1253        I have personally reviewed the following labs and images: CBC: Recent Labs  Lab 09/23/20 2008 09/24/20 0342 09/25/20 0358 09/26/20 0227 09/27/20 0336 09/28/20 0334 09/29/20 0217  WBC 17.9* 17.0* 14.2* 16.8* 15.6* 19.2* 20.3*  NEUTROABS 14.9* 12.6* 9.6* 11.8* 10.9*  --   --   HGB 8.4* 9.2* 9.0* 8.1* 8.0* 8.0* 8.3*  HCT 28.7* 31.9* 29.7* 27.5* 27.7* 28.0* 28.3*  MCV 87.2 87.4 85.8 86.5 86.8 86.7 86.3  PLT 723* 757* 790* 746* 755* 810* 812*   BMP &GFR Recent Labs  Lab 09/25/20 0358 09/26/20 0227 09/27/20 0336 09/28/20 0334 09/29/20 0217  NA 137 136 137 138 139  K 3.6 3.5 3.5 4.1 3.8  CL 103 103 104 105 106  CO2 24 25 26 25 24   GLUCOSE 80 109* 103* 103*  166*  BUN 36* 32* 30* 29* 25*  CREATININE 1.71* 1.47* 1.42* 1.31* 1.21*  CALCIUM 7.9* 7.7* 7.9* 8.1* 8.3*  MG  --  1.7  --  1.7 1.5*  PHOS  --   --   --  2.4* 2.2*   Estimated Creatinine Clearance: 68.3 mL/min (A) (by C-G formula based on SCr of 1.21 mg/dL (H)). Liver & Pancreas: Recent Labs  Lab 09/22/20 2346 09/23/20 0939 09/23/20 2008 09/24/20 0342 09/25/20 0358 09/28/20 0334 09/29/20 0217  AST 23 33 28 24 17   --   --   ALT 16 15 16 16 13   --   --   ALKPHOS 67 69 75 83 74  --   --   BILITOT 0.4 0.6 0.5 0.6 0.7  --   --   PROT 5.8* 5.9* 6.2* 6.4* 6.0*  --   --   ALBUMIN 2.0* 2.0* 2.0* 2.2* 2.1* 2.1* 2.2*   Recent Labs  Lab 09/22/20 1825  AMYLASE 75   No results for input(s): AMMONIA in the last 168 hours. Diabetic: Recent Labs    09/28/20 0334  HGBA1C 5.5   Recent Labs  Lab 09/28/20 2015 09/29/20 0010 09/29/20 0446 09/29/20 0915 09/29/20 1210  GLUCAP 111* 139* 118* 127* 151*   Cardiac Enzymes: No results for input(s): CKTOTAL, CKMB, CKMBINDEX, TROPONINI in the last 168 hours. No results for input(s): PROBNP in the last 8760 hours. Coagulation Profile: Recent Labs  Lab 09/24/20 1035  INR 1.3*   Thyroid Function Tests: No results for input(s): TSH, T4TOTAL, FREET4, T3FREE, THYROIDAB in the last 72 hours.  Lipid Profile: No results for input(s): CHOL, HDL, LDLCALC, TRIG, CHOLHDL, LDLDIRECT in the last 72 hours. Anemia Panel: Recent Labs    09/28/20 0334  VITAMINB12 177*  FOLATE 26.3  FERRITIN 416*  TIBC 214*  IRON 31  RETICCTPCT 1.7   Urine analysis:    Component Value Date/Time   COLORURINE YELLOW 09/22/2020 1212   APPEARANCEUR CLEAR 09/22/2020 1212   LABSPEC <1.005 (L) 09/22/2020 1212   PHURINE 5.5 09/22/2020 1212   GLUCOSEU NEGATIVE 09/22/2020 1212   HGBUR NEGATIVE 09/22/2020 1212   BILIRUBINUR NEGATIVE 09/22/2020 1212   KETONESUR NEGATIVE 09/22/2020 1212   PROTEINUR NEGATIVE 09/22/2020 1212   UROBILINOGEN 2.0 (H) 07/08/2011 0051    NITRITE NEGATIVE 09/22/2020 1212   LEUKOCYTESUR SMALL (A) 09/22/2020 1212   Sepsis Labs: Invalid input(s): PROCALCITONIN, LACTICIDVEN  Microbiology: Recent Results (from the past 240 hour(s))  Culture, blood (routine x 2)     Status: Abnormal   Collection Time: 09/21/20 10:57 AM   Specimen: BLOOD  Result Value Ref Range Status   Specimen Description BLOOD SITE NOT SPECIFIED  Final   Special Requests   Final    BOTTLES DRAWN AEROBIC AND ANAEROBIC Blood Culture adequate volume   Culture  Setup Time   Final    AEROBIC BOTTLE ONLY GRAM POSITIVE COCCI IN CLUSTERS CRITICAL VALUE NOTED.  VALUE IS CONSISTENT WITH PREVIOUSLY REPORTED AND CALLED VALUE. Performed at [facility], [address], [place], [address] [place]    Culture STAPHYLOCOCCUS EPIDERMIDIS (A)  Final   Report Status 09/26/2020 FINAL  Final   Organism ID, Bacteria STAPHYLOCOCCUS EPIDERMIDIS  Final      Susceptibility   Staphylococcus epidermidis - MIC*    CIPROFLOXACIN <=0.5 SENSITIVE Sensitive     ERYTHROMYCIN >=8 RESISTANT Resistant     GENTAMICIN <=0.5 SENSITIVE Sensitive     OXACILLIN >=4 RESISTANT Resistant     TETRACYCLINE >=16 RESISTANT Resistant     VANCOMYCIN 2 SENSITIVE Sensitive     TRIMETH/SULFA <=10 SENSITIVE Sensitive     CLINDAMYCIN <=0.25 SENSITIVE Sensitive     RIFAMPIN <=0.5 SENSITIVE Sensitive  Inducible Clindamycin NEGATIVE Sensitive     * STAPHYLOCOCCUS EPIDERMIDIS  Culture, blood (routine x 2)     Status: Abnormal   Collection Time: 09/21/20 11:20 AM   Specimen: BLOOD RIGHT HAND  Result Value Ref Range Status   Specimen Description BLOOD RIGHT HAND  Final   Special Requests   Final    BOTTLES DRAWN AEROBIC ONLY Blood Culture adequate volume   Culture  Setup Time   Final    GRAM POSITIVE COCCI IN CLUSTERS AEROBIC BOTTLE ONLY CRITICAL RESULT CALLED TO, READ BACK BY AND VERIFIED WITHCeledonio Miyamoto [facility] 1157 09/22/20 A BROWNING Performed at [facility], [address],  [place], [place] [address]    Culture STAPHYLOCOCCUS EPIDERMIDIS (A)  Final   Report Status 09/24/2020 FINAL  Final   Organism ID, Bacteria STAPHYLOCOCCUS EPIDERMIDIS  Final      Susceptibility   Staphylococcus epidermidis - MIC*    CIPROFLOXACIN <=0.5 SENSITIVE Sensitive     ERYTHROMYCIN >=8 RESISTANT Resistant     GENTAMICIN <=0.5 SENSITIVE Sensitive     OXACILLIN >=4 RESISTANT Resistant     TETRACYCLINE >=16 RESISTANT Resistant     VANCOMYCIN 2 SENSITIVE Sensitive     TRIMETH/SULFA <=10 SENSITIVE Sensitive     CLINDAMYCIN <=0.25 SENSITIVE Sensitive     RIFAMPIN <=0.5 SENSITIVE Sensitive     Inducible Clindamycin NEGATIVE Sensitive     * STAPHYLOCOCCUS EPIDERMIDIS  Blood Culture ID Panel (Reflexed)     Status: Abnormal   Collection Time: 09/21/20 11:20 AM  Result Value Ref Range Status   Enterococcus faecalis NOT DETECTED NOT DETECTED Final   Enterococcus Faecium NOT DETECTED NOT DETECTED Final   Listeria monocytogenes NOT DETECTED NOT DETECTED Final   Staphylococcus species DETECTED (A) NOT DETECTED Final    Comment: CRITICAL RESULT CALLED TO, READ BACK BY AND VERIFIED WITH: Celedonio Miyamoto PHARMD 1627 09/22/20 A BROWNING    Staphylococcus aureus (BCID) NOT DETECTED NOT DETECTED Final   Staphylococcus epidermidis DETECTED (A) NOT DETECTED Final    Comment: Methicillin (oxacillin) resistant coagulase negative staphylococcus. Possible blood culture contaminant (unless isolated from more than one blood culture draw or clinical case suggests pathogenicity). No antibiotic treatment is indicated for blood  culture contaminants. CRITICAL RESULT CALLED TO, READ BACK BY AND VERIFIED WITH: Celedonio Miyamoto PHARMD 1627 09/22/20 A BROWNING    Staphylococcus lugdunensis NOT DETECTED NOT DETECTED Final   Streptococcus species NOT DETECTED NOT DETECTED Final   Streptococcus agalactiae NOT DETECTED NOT DETECTED Final   Streptococcus pneumoniae NOT DETECTED NOT DETECTED Final   Streptococcus pyogenes NOT  DETECTED NOT DETECTED Final   A.calcoaceticus-baumannii NOT DETECTED NOT DETECTED Final   Bacteroides fragilis NOT DETECTED NOT DETECTED Final   Enterobacterales NOT DETECTED NOT DETECTED Final   Enterobacter cloacae complex NOT DETECTED NOT DETECTED Final   Escherichia coli NOT DETECTED NOT DETECTED Final   Klebsiella aerogenes NOT DETECTED NOT DETECTED Final   Klebsiella oxytoca NOT DETECTED NOT DETECTED Final   Klebsiella pneumoniae NOT DETECTED NOT DETECTED Final   Proteus species NOT DETECTED NOT DETECTED Final   Salmonella species NOT DETECTED NOT DETECTED Final   Serratia marcescens NOT DETECTED NOT DETECTED Final   Haemophilus influenzae NOT DETECTED NOT DETECTED Final   Neisseria meningitidis NOT DETECTED NOT DETECTED Final   Pseudomonas aeruginosa NOT DETECTED NOT DETECTED Final   Stenotrophomonas maltophilia NOT DETECTED NOT DETECTED Final   Candida albicans NOT DETECTED NOT DETECTED Final   Candida auris NOT DETECTED NOT DETECTED Final  Candida glabrata NOT DETECTED NOT DETECTED Final   Candida krusei NOT DETECTED NOT DETECTED Final   Candida parapsilosis NOT DETECTED NOT DETECTED Final   Candida tropicalis NOT DETECTED NOT DETECTED Final   Cryptococcus neoformans/gattii NOT DETECTED NOT DETECTED Final   Methicillin resistance mecA/C DETECTED (A) NOT DETECTED Final    Comment: CRITICAL RESULT CALLED TO, READ BACK BY AND VERIFIED WITHCeledonio Miyamoto [facility] 4098 09/22/20 A BROWNING Performed at [facility], [address], [place], [place] [address]   Culture, body fluid w Gram Stain-bottle     Status: None (Preliminary result)   Collection Time: 09/22/20  4:51 PM   Specimen: Pleura  Result Value Ref Range Status   Specimen Description PLEURAL  Final   Special Requests NONE  Final   Culture   Final    NO GROWTH 3 DAYS Performed at [facility], [address], [place], [place] [address]    Report Status PENDING  Incomplete  Gram stain     Status: None    Collection Time: 09/22/20  4:51 PM   Specimen: Pleura  Result Value Ref Range Status   Specimen Description PLEURAL  Final   Special Requests NONE  Final   Gram Stain   Final    ABUNDANT WBC PRESENT, PREDOMINANTLY PMN NO ORGANISMS SEEN Performed at [facility], [address] [place], [place] [address]    Report Status 09/22/2020 FINAL  Final  Culture, blood (routine x 2)     Status: None   Collection Time: 09/24/20  9:55 AM   Specimen: BLOOD LEFT HAND  Result Value Ref Range Status   Specimen Description BLOOD LEFT HAND  Final   Special Requests   Final    BOTTLES DRAWN AEROBIC AND ANAEROBIC Blood Culture adequate volume   Culture   Final    NO GROWTH 5 DAYS Performed at [facility], [address], [place], [place] [address]    Report Status 09/29/2020 FINAL  Final  Culture, blood (routine x 2)     Status: None   Collection Time: 09/24/20 10:05 AM   Specimen: BLOOD RIGHT HAND  Result Value Ref Range Status   Specimen Description BLOOD RIGHT HAND  Final   Special Requests   Final    BOTTLES DRAWN AEROBIC AND ANAEROBIC Blood Culture adequate volume   Culture   Final    NO GROWTH 5 DAYS Performed at [facility], [address] [place], [place] [address]    Report Status 09/29/2020 FINAL  Final    Radiology Studies: No results found.    Daisa Stennis T. Merci Walthers [facility]  If 7PM-7AM, please contact night-coverage [email or web address] 09/29/2020, 12:13 PM

## 2020-09-29 NOTE — Progress Notes (Signed)
Physical Therapy Treatment Patient Details Name: [name] MRN: [id number] DOB: [birth year] Today's Date: 09/29/2020   History of Present Illness Pt is a 55 y.o. female admitted 09/12/20 with SOB, fatigue, bruising. Workup for symptomatic anemia, idiopathic thrombocytopenic purpura. 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 CXR 9/18 with concern for bilateral PNA, large L pleural effusion. S/p thoracentesis 9/19. PMH includes R BKA (2013), CHF, DM2, HTN, stroke, ICH, arthritis, neuropathy, chronic indwelling catheter.    PT Comments    Pt requests OOB to chair upon PT arrival, but declines stand attempt stating "I've tried, it doesn't work". PT unable to get drop arms down on recliner, so pt learned AP transfer technique to recliner, requiring light assist only to steady and progress buttocks into recliner. Pt eager to d/c to CIR.    Recommendations for follow up therapy are one component of a multi-disciplinary discharge planning process, led by the attending physician.  Recommendations may be updated based on patient status, additional functional criteria and insurance authorization.  Follow Up Recommendations  CIR     Equipment Recommendations  None recommended by PT    Recommendations for Other Services       Precautions / Restrictions Precautions Precautions: Fall;Other (comment) Precaution Comments: L abdominal JP drain; R BKA (unable to don prosthesis due to swelling) Restrictions Weight Bearing Restrictions: No     Mobility  Bed Mobility Overal bed mobility: Needs Assistance Bed Mobility: Supine to Sit     Supine to sit: Supervision;HOB elevated     General bed mobility comments: use of bedrails and HOB elevation.    Transfers Overall transfer level: Needs assistance Equipment used: None Transfers: Licensed conveyancer transfers: Min assist;From elevated surface   General transfer comment: Min assist for AP transfer into  recliner for steadying pt and recliner, cues for sequencing task and hand placement.  Ambulation/Gait                 Stairs             Wheelchair Mobility    Modified Rankin (Stroke Patients Only)       Balance Overall balance assessment: Needs assistance Sitting-balance support: No upper extremity supported Sitting balance-Leahy Scale: Fair                                      Cognition Arousal/Alertness: Awake/alert Behavior During Therapy: WFL for tasks assessed/performed Overall Cognitive Status: No family/caregiver present to determine baseline cognitive functioning Area of Impairment: Safety/judgement;Awareness;Following commands;Attention;Problem solving                   Current Attention Level: Sustained   Following Commands: Follows one step commands inconsistently;Follows one step commands with increased time Safety/Judgement: Decreased awareness of safety;Decreased awareness of deficits Awareness: Emergent Problem Solving: Slow processing;Decreased initiation;Difficulty sequencing;Requires verbal cues;Requires tactile cues General Comments: Pt frequently distracted and requests more time before initating mobility. Pt requires step-by-step cues for mobility tasks, especially novel tasks.      Exercises      General Comments General comments (skin integrity, edema, etc.): Pt with O2 and sensor doffed upon PT arrival to room, PT applied new O2 sensor with sats 81% on RA. PT placed pt back on 2LO2 for recovery of sats to 90s.      Pertinent Vitals/Pain Pain Assessment: Faces Faces Pain Scale: Hurts a little bit Pain Location: generalized  with mobility Pain Descriptors / Indicators: Discomfort;Grimacing Pain Intervention(s): Limited activity within patient's tolerance;Monitored during session;Repositioned    Home Living                      Prior Function            PT Goals (current goals can now be found  in the care plan section) Acute Rehab PT Goals Patient Stated Goal: to go to CIR to get better and go home PT Goal Formulation: With patient Time For Goal Achievement: 10/06/20 Potential to Achieve Goals: Good Progress towards PT goals: Progressing toward goals    Frequency    Min 3X/week      PT Plan Current plan remains appropriate    Co-evaluation              AM-PAC PT "6 Clicks" Mobility   Outcome Measure  Help needed turning from your back to your side while in a flat bed without using bedrails?: A Little Help needed moving from lying on your back to sitting on the side of a flat bed without using bedrails?: A Little Help needed moving to and from a bed to a chair (including a wheelchair)?: A Little Help needed standing up from a chair using your arms (e.g., wheelchair or bedside chair)?: A Lot Help needed to walk in hospital room?: Total Help needed climbing 3-5 steps with a railing? : Total 6 Click Score: 13    End of Session   Activity Tolerance: Patient tolerated treatment well;Patient limited by fatigue Patient left: in chair;with call bell/phone within reach;with chair alarm set;with nursing/sitter in room (RN in room fixing IV) Nurse Communication: Mobility status PT Visit Diagnosis: Unsteadiness on feet (R26.81);Muscle weakness (generalized) (M62.81);Difficulty in walking, not elsewhere classified (R26.2)     Time: 1950-9326 PT Time Calculation (min) (ACUTE ONLY): 27 min  Charges:  $Therapeutic Activity: 8-22 mins                     Marye Round, PT DPT Acute Rehabilitation Services Pager [phone]  Office [phone]    Truddie Coco 09/29/2020, 4:39 PM

## 2020-09-29 NOTE — Progress Notes (Signed)
  Mobility Specialist Criteria Algorithm Info.  Mobility Team: [facility] elevated:Self regulated Activity: Ambulated in room; Transferred to/from [facility] Range of motion: Passive; Right leg Level of assistance: Moderate assist, patient does 50-74% Assistive device: BSC; Other (Comment) (Arms Held) Minutes sitting in chair:  Minutes stood:  Minutes ambulated:  Distance ambulated (ft):  Mobility response: Tolerated well Bed Position: Semi-fowlers  Patient received on [facility] requesting assistance back to bed.Pt was fearful initially but felt better about getting assitance from mobility with reassurance of safety  Required moderate HHA + cues to stand and pivot to bed.Tolerated well without incident and was left dangling EOB with RN present.   09/29/2020 2:58 PM

## 2020-09-29 NOTE — Progress Notes (Signed)
RT NOTES: Pt refuses to do flutter at this time. Says she will do it later.

## 2020-09-29 NOTE — Progress Notes (Signed)
[facility]  Checked patient's chart this morning, it appears that the surgical team is still working up her unknown source of leukocytosis.  Continues with complication status post splenectomy.  We will continue to check on patient to see when she may be ready for colonoscopy in the future given abnormal imaging.  No plans for one at this moment.  Hyacinth Meeker, PA-C

## 2020-09-29 NOTE — Progress Notes (Addendum)
11 Days Post-Op  Subjective: CC: Reports left sided abdominal pain around her drain that she is unable to describe but is the same as yesterday, no better and no worse. Denies further nausea/vomiting. Tolerating PO. Reports multiple loose stools yesterday and today. Denies shortness of breath, fever, chills.   Objective: Vital signs in last 24 hours: Temp:  [98.4 F (36.9 C)-99.5 F (37.5 C)] 99.5 F (37.5 C) (09/25 1926) Pulse Rate:  [79-87] 87 (09/25 1600) Resp:  [16-18] 16 (09/25 1926) BP: (150-175)/(51-73) 155/51 (09/25 1622) SpO2:  [99 %-100 %] 100 % (09/25 1600) Weight:  [108.8 kg] 108.8 kg (09/26 0326) Last BM Date: 09/28/20  Intake/Output from previous day: 09/25 0701 - 09/26 0700 In: 1198.5 [P.O.:840; IV Piggyback:358.5] Out: 1800 [Urine:1800] Intake/Output this shift: No intake/output data recorded.  PE: Gen:  Alert, NAD, pleasant, sitting on bedside commode Pulm:  On o2 via Rote Normal rate and effort, decreased lung sounds left base Abd: Soft, obese, mild tenderness of the epigastrium over incision with some firmness right lateral upper incision (suspect small hematoma) without signs of cellulitis or infection, flanks are firm and edematous bilaterally, mild tenderness around LUQ drain without rebound or guarding. Drain with cloudy, tan output.  Ext:  R BKA. Psych: A&Ox3  Skin: no rashes noted, warm and dry  Lab Results:  Recent Labs    09/28/20 0334 09/29/20 0217  WBC 19.2* 20.3*  HGB 8.0* 8.3*  HCT 28.0* 28.3*  PLT 810* 812*   BMET Recent Labs    09/28/20 0334 09/29/20 0217  NA 138 139  K 4.1 3.8  CL 105 106  CO2 25 24  GLUCOSE 103* 166*  BUN 29* 25*  CREATININE 1.31* 1.21*  CALCIUM 8.1* 8.3*   PT/INR No results for input(s): LABPROT, INR in the last 72 hours. CMP     Component Value Date/Time   NA 139 09/29/2020 0217   NA 139 05/16/2019 1522   NA 138 09/30/2015 1118   K 3.8 09/29/2020 0217   K 4.6 09/30/2015 1118   CL 106  09/29/2020 0217   CO2 24 09/29/2020 0217   CO2 19 (L) 09/30/2015 1118   GLUCOSE 166 (H) 09/29/2020 0217   GLUCOSE 200 (H) 09/30/2015 1118   BUN 25 (H) 09/29/2020 0217   BUN 23 05/16/2019 1522   BUN 22.3 09/30/2015 1118   CREATININE 1.21 (H) 09/29/2020 0217   CREATININE 2.63 (H) 06/12/2020 0755   CREATININE 1.3 (H) 09/30/2015 1118   CALCIUM 8.3 (L) 09/29/2020 0217   CALCIUM 9.6 09/30/2015 1118   PROT 6.0 (L) 09/25/2020 0358   PROT 8.2 05/16/2019 1522   ALBUMIN 2.2 (L) 09/29/2020 0217   ALBUMIN 4.2 05/16/2019 1522   AST 17 09/25/2020 0358   AST 15 06/12/2020 0755   ALT 13 09/25/2020 0358   ALT 9 06/12/2020 0755   ALKPHOS 74 09/25/2020 0358   BILITOT 0.7 09/25/2020 0358   BILITOT 0.3 06/12/2020 0755   GFRNONAA 54 (L) 09/29/2020 0217   GFRNONAA 21 (L) 06/12/2020 0755   GFRAA 59 (L) 09/19/2019 1449   Lipase     Component Value Date/Time   LIPASE 23 06/26/2019 0124    Studies/Results: DG Chest 2 View  Result Date: 09/28/2020 CLINICAL DATA:  Hypoxemia. EXAM: CHEST - 2 VIEW COMPARISON:  09/23/2020.  CT, 09/24/2020. FINDINGS: Stable mild enlargement of the cardiopericardial silhouette. Persistent opacity at the left lung base obscures portions of the left heart border and left hemidiaphragm. Left perihilar patchy airspace opacity  noted on the prior radiograph has improved. There is mild opacity at the medial right lung base consistent with atelectasis. Remainder of the lungs is clear. No pneumothorax. Left inferior hemithorax chest tube is stable from the prior exams. IMPRESSION: 1. Mild interval improvement. Opacity noted in the left perihilar lung has decreased from the prior chest radiograph and chest CT. Opacity at the left lung base is without significant change. No change in the left chest tube. 2. No pneumothorax. Electronically Signed   By: Amie Portland M.D.   On: 09/28/2020 10:57    Anti-infectives: Anti-infectives (From admission, onward)    Start     Dose/Rate Route  Frequency Ordered Stop   09/29/20 1800  amoxicillin-clavulanate (AUGMENTIN) 875-125 MG per tablet 1 tablet        1 tablet Oral Every 12 hours 09/29/20 0728 10/20/20 2159   09/27/20 1045  vancomycin (VANCOREADY) IVPB 1750 mg/350 mL  Status:  Discontinued        1,750 mg 175 mL/hr over 120 Minutes Intravenous Every 24 hours 09/27/20 1040 09/27/20 1043   09/27/20 1045  vancomycin (VANCOREADY) IVPB 1250 mg/250 mL        1,250 mg 166.7 mL/hr over 90 Minutes Intravenous Every 24 hours 09/27/20 1043 09/29/20 2359   09/25/20 1200  vancomycin (VANCOREADY) IVPB 1750 mg/350 mL  Status:  Discontinued        1,750 mg 175 mL/hr over 120 Minutes Intravenous Every 48 hours 09/23/20 1056 09/27/20 1040   09/23/20 1400  ceFEPIme (MAXIPIME) 2 g in sodium chloride 0.9 % 100 mL IVPB        2 g 200 mL/hr over 30 Minutes Intravenous Every 12 hours 09/23/20 1047 09/29/20 2359   09/23/20 1400  metroNIDAZOLE (FLAGYL) tablet 500 mg  Status:  Discontinued        500 mg Oral Every 12 hours 09/23/20 1047 09/24/20 1028   09/23/20 1200  vancomycin (VANCOREADY) IVPB 2000 mg/400 mL        2,000 mg 200 mL/hr over 120 Minutes Intravenous  Once 09/23/20 1047 09/23/20 1326   09/21/20 1400  piperacillin-tazobactam (ZOSYN) IVPB 3.375 g  Status:  Discontinued        3.375 g 12.5 mL/hr over 240 Minutes Intravenous Every 8 hours 09/21/20 1122 09/23/20 1047   09/18/20 1115  cefoTEtan (CEFOTAN) 2 g in sodium chloride 0.9 % 100 mL IVPB        2 g 200 mL/hr over 30 Minutes Intravenous  Once 09/18/20 1112 09/18/20 1250   09/18/20 1114  sodium chloride 0.9 % with cefoTEtan (CEFOTAN) ADS Med       Note to Pharmacy: Janene Harvey   : cabinet override      09/18/20 1114 09/18/20 1253        Assessment/Plan POD#11 ITP/Thrombocytopenia s/p Hand-assisted laparoscopic splenectomy 6/15 Dr. Donell Beers - Received vaccines pre-op - WBC  15 > 19 >20 . CT 9/20 reassuring and without organized abscess. Other etiologies of leukocytosis could be  reactive s/p splenectomy, or pulmonary as patient has been treated for HAP and loculated L pleural effusion as well. Repeat blood Cx 9/21 NGTD so bacteremia seems resolved. Abdominal exam overall benign with mild, appropriate tenderness. Would hold off on repeat CT abdomen for now but she may need repeat CT A/P this week if her WBC continues to rise without an obvious source or if her exam/abdominal sxs worsen. - Awaiting drain amylase 9/25 to r/o pancreatic leak - Cont JP drain - Suspect small hematoma adjacent  to upper incision. No evidence of cellulitis  - Mobilize. Therapies following. CIR following.  -   FEN - CM VTE - SCD, Lovenox  ID - Vanc/Cefepime until 9/26 per ID, then PO augmentin, Afebrile. WBC rising (20 today) Foley -out, voiding.    - Per [facility] -  Staph epi bacteremia - ID following. Repeat blood cx's pending.  HAP Loculated left pleural effusion s/p thora 9/19. Cx neg. Loculated pleural effusion on CT 9/21 Cecal wall thickening - noted on CT, GI following and determining timing of colonoscopy  Thyroid nodule - TR[facility] working up ABL anemia - hgb stable WC bound due to RLE BKA DM CHF HTN H/O CVA AKI - Improving, Cr 2.06 > 1.71 > 1.47 > 1.42 > 1.31 > 1.21   LOS: 17 days    Adam Phenix , [facility] 09/29/2020, 11:47 AM Please see Amion for pager number during day hours 7:00am-4:30pm

## 2020-09-29 NOTE — Progress Notes (Signed)
Inpatient Rehabilitation Admissions Coordinator   I have insurance approval for CIR, but no bed available today to admit her today. I am hopeful for Bed this week. I spoke with patient by phone and she is aware. I will follow up tomorrow.  Ottie Glazier, RN, MSN Rehab Admissions Coordinator [phone] 09/29/2020 10:31 AM

## 2020-09-30 ENCOUNTER — Telehealth: Payer: Self-pay | Admitting: Internal Medicine

## 2020-09-30 DIAGNOSIS — D649 Anemia, unspecified: Secondary | ICD-10-CM | POA: Diagnosis not present

## 2020-09-30 DIAGNOSIS — J189 Pneumonia, unspecified organism: Secondary | ICD-10-CM

## 2020-09-30 DIAGNOSIS — D509 Iron deficiency anemia, unspecified: Secondary | ICD-10-CM | POA: Diagnosis not present

## 2020-09-30 DIAGNOSIS — J9601 Acute respiratory failure with hypoxia: Secondary | ICD-10-CM

## 2020-09-30 DIAGNOSIS — D693 Immune thrombocytopenic purpura: Secondary | ICD-10-CM | POA: Diagnosis not present

## 2020-09-30 DIAGNOSIS — Y95 Nosocomial condition: Secondary | ICD-10-CM

## 2020-09-30 DIAGNOSIS — J90 Pleural effusion, not elsewhere classified: Secondary | ICD-10-CM

## 2020-09-30 DIAGNOSIS — R7881 Bacteremia: Secondary | ICD-10-CM | POA: Diagnosis not present

## 2020-09-30 LAB — RENAL FUNCTION PANEL
Albumin: 2.2 g/dL — ABNORMAL LOW (ref 3.5–5.0)
Anion gap: 8 (ref 5–15)
BUN: 19 mg/dL (ref 6–20)
CO2: 25 mmol/L (ref 22–32)
Calcium: 8.5 mg/dL — ABNORMAL LOW (ref 8.9–10.3)
Chloride: 104 mmol/L (ref 98–111)
Creatinine, Ser: 0.98 mg/dL (ref 0.44–1.00)
GFR, Estimated: >60 mL/min (ref >60)
Glucose, Bld: 100 mg/dL — ABNORMAL HIGH (ref 70–99)
Phosphorus: 2.3 mg/dL — ABNORMAL LOW (ref 2.5–4.6)
Potassium: 3.8 mmol/L (ref 3.5–5.1)
Sodium: 137 mmol/L (ref 135–145)

## 2020-09-30 LAB — GLUCOSE, CAPILLARY
Glucose-Capillary: 106 mg/dL — ABNORMAL HIGH (ref 70–99)
Glucose-Capillary: 104 mg/dL — ABNORMAL HIGH (ref 70–99)
Glucose-Capillary: 155 mg/dL — ABNORMAL HIGH (ref 70–99)
Glucose-Capillary: 169 mg/dL — ABNORMAL HIGH (ref 70–99)
Glucose-Capillary: 140 mg/dL — ABNORMAL HIGH (ref 70–99)

## 2020-09-30 LAB — CBC
HCT: 28.7 % — ABNORMAL LOW (ref 36.0–46.0)
Hemoglobin: 8.4 g/dL — ABNORMAL LOW (ref 12.0–15.0)
MCH: 25 pg — ABNORMAL LOW (ref 26.0–34.0)
MCHC: 29.3 g/dL — ABNORMAL LOW (ref 30.0–36.0)
MCV: 85.4 fL (ref 80.0–100.0)
Platelets: 864 10*3/uL — ABNORMAL HIGH (ref 150–400)
RBC: 3.36 MIL/uL — ABNORMAL LOW (ref 3.87–5.11)
RDW: 24.6 % — ABNORMAL HIGH (ref 11.5–15.5)
WBC: 17.4 10*3/uL — ABNORMAL HIGH (ref 4.0–10.5)
nRBC: 0 % (ref 0.0–0.2)

## 2020-09-30 LAB — MAGNESIUM: Magnesium: 1.6 mg/dL — ABNORMAL LOW (ref 1.7–2.4)

## 2020-09-30 LAB — BRAIN NATRIURETIC PEPTIDE: B Natriuretic Peptide: 834.2 pg/mL — ABNORMAL HIGH (ref 0.0–100.0)

## 2020-09-30 MED ORDER — ISOSORBIDE MONONITRATE ER 30 MG PO TB24
30.0000 mg | ORAL_TABLET | Freq: Every day | ORAL | Status: DC
  Administered 2020-09-30 – 2020-10-02 (×3): 30 mg via ORAL
  Filled 2020-09-30 (×3): qty 1

## 2020-09-30 MED ORDER — MAGNESIUM SULFATE 2 GM/50ML IV SOLN
2.0000 g | Freq: Once | INTRAVENOUS | Status: AC
  Administered 2020-09-30: 2 g via INTRAVENOUS
  Filled 2020-09-30: qty 50

## 2020-09-30 NOTE — Plan of Care (Signed)
  Problem: Coping: Goal: Level of anxiety will decrease Outcome: Progressing   Problem: Elimination: Goal: Will not experience complications related to bowel motility Outcome: Progressing   

## 2020-09-30 NOTE — Progress Notes (Signed)
Attempted to wean patient from O2 per MD. Patient O2 dropped to 80s while on room air. Placed patient back on nasal cannula 2L O2 to maintain O2 sats in 90s.

## 2020-09-30 NOTE — Care Management Important Message (Signed)
Important Message  Patient Details  Name: [name] MRN: [id number] Date of Birth: [birth year]   Medicare Important Message Given:  Yes     Renie Ora 09/30/2020, 9:44 AM

## 2020-09-30 NOTE — Progress Notes (Signed)
HEMATOLOGY-ONCOLOGY PROGRESS NOTE  SUBJECTIVE: [name] tells me that she continues to feel better.  She reports mild discomfort in her abdomen.  She had some mild nausea and vomiting earlier today but has been able to eat without any difficulty.  She is not having any fevers or chills.  She is now on room air.  She has been out of bed and able to ambulate.  She tells me that she is going to go to CIR once the bed is available later this week.   REVIEW OF SYSTEMS:   Constitutional: Denies fevers, chills Eyes: Denies blurriness of vision Ears, nose, mouth, throat, and face: Denies mucositis or sore throat Respiratory: Denies cough, dyspnea or wheezes Cardiovascular: Denies palpitation, chest discomfort Gastrointestinal: She had some nausea and vomiting early today but able to eat, reports mild abdominal discomfort Skin: Denies abnormal skin rashes Lymphatics: Denies new lymphadenopathy or easy bruising Neurological:Denies numbness, tingling or new weaknesses Behavioral/Psych: Mood is stable, no new changes  Extremities: No lower extremity edema All other systems were reviewed with the patient and are negative.  I have reviewed the past medical history, past surgical history, social history and family history with the patient and they are unchanged from previous note.   PHYSICAL EXAMINATION:  Vitals:   09/30/20 0410 09/30/20 0753  BP: (!) 151/62 (!) 152/64  Pulse: 86 79  Resp: (!) 21 (!) 21  Temp: 98.8 F (37.1 C) 99 F (37.2 C)  SpO2: 91% 94%   Filed Weights   09/27/20 0357 09/29/20 0326 09/30/20 0410  Weight: 109.6 kg 108.8 kg 107.5 kg    Intake/Output from previous day: 09/26 0701 - 09/27 0700 In: 880 [P.O.:880] Out: 1610 [Urine:1600; Drains:10]  GENERAL:alert, no distress and comfortable SKIN: skin color, texture, turgor are normal, no rashes or significant lesions EYES: normal, Conjunctiva are pink and non-injected, sclera clear OROPHARYNX:no exudate, no erythema  and lips, buccal mucosa, and tongue normal  LUNGS: clear to auscultation and percussion with normal breathing effort HEART: regular rate & rhythm and no murmurs and no lower extremity edema ABDOMEN: Positive bowel sounds, soft, nontender NEURO: alert & oriented x 3 with fluent speech, no focal motor/sensory deficits  LABORATORY DATA:  I have reviewed the data as listed CMP Latest Ref Rng & Units 09/30/2020 09/29/2020 09/28/2020  Glucose 70 - 99 mg/dL 098(J) 191(Y) 782(N)  BUN 6 - 20 mg/dL 19 56(O) 13(Y)  Creatinine 0.44 - 1.00 mg/dL 8.65 7.84(O) 9.62(X)  Sodium 135 - 145 mmol/L 137 139 138  Potassium 3.5 - 5.1 mmol/L 3.8 3.8 4.1  Chloride 98 - 111 mmol/L 104 106 105  CO2 22 - 32 mmol/L Calcium 8.9 - 10.3 mg/dL 5.2(W) 8.3(L) 8.1(L)  Total Protein 6.5 - 8.1 g/dL - - -  Total Bilirubin 0.3 - 1.2 mg/dL - - -  Alkaline Phos 38 - 126 U/L - - -  AST 15 - 41 U/L - - -  ALT 0 - 44 U/L - - -    Lab Results  Component Value Date   WBC 17.4 (H) 09/30/2020   HGB 8.4 (L) 09/30/2020   HCT 28.7 (L) 09/30/2020   MCV 85.4 09/30/2020   PLT 864 (H) 09/30/2020   NEUTROABS 10.9 (H) 09/27/2020    CT ABDOMEN PELVIS WO CONTRAST  Result Date: 09/23/2020 CLINICAL DATA:  Abdominal pain. Fever. Postop. Recent laparoscopic splenectomy. EXAM: CT ABDOMEN AND PELVIS WITHOUT CONTRAST TECHNIQUE: Multidetector CT imaging of the abdomen and pelvis was performed following  the standard protocol without IV contrast. COMPARISON:  Renal ultrasound 06/12/2020. Abdominopelvic CT 06/26/2019 FINDINGS: Lower chest: Consolidation throughout the included left lower lobe with small pleural effusion. There is enteric contrast in the distal esophagus. Trace right pleural effusion. Hepatobiliary: Prominent size liver spans 19.3 cm cranial caudal. Mild subjective hepatic steatosis. Focal hepatic abnormality on this unenhanced exam. The gallbladder is decompressed. No visualized calcified gallstone. No biliary dilatation  Pancreas: Atrophic.  No ductal dilatation or inflammation. Spleen: Splenectomy. Surgical drain in the left upper quadrant. Small amount of ill-defined fluid and soft tissue density adjacent to the drain without organized collection. Adrenals/Urinary Tract: No adrenal nodule. No hydronephrosis. Mild bilateral perinephric edema. No visualized renal stone or focal lesion. Calcifications at the renal hila are felt to be vascular. Urinary bladder is decompressed by Foley catheter. Stomach/Bowel: Enteric contrast is seen in the distal esophagus. The stomach is physiologically distended. There is no gastric wall thickening. No obstruction, enteric contrast is seen in the colon. Majority of the small bowel is decompressed. No small bowel inflammation. Normal appendix. Irregular wall thickening of the cecum, series 3, image 73, series 6, image 48. This measures up to 13 mm. No colonic inflammation. Vascular/Lymphatic: Advanced aortic and branch atherosclerosis. No aortic aneurysm. No portal venous or mesenteric gas. No enlarged lymph nodes in the abdomen or pelvis. Reproductive: Unremarkable appearance of the uterus. The ovaries are not well-defined, quiescent appearance of the right ovary. The left ovary is not well seen. No suspicious adnexal mass. Other: Generalized body wall edema, confluent in the left flank. No organized subcutaneous collection. No free air. Minimal ill-defined fluid and soft tissue density in the left upper quadrant with organized abscess or collection. Musculoskeletal: There are no acute or suspicious osseous abnormalities. IMPRESSION: 1. Recent splenectomy. Surgical drain in the left upper quadrant with small amount of ill-defined fluid and soft tissue density adjacent to the drain without organized collection or abscess. 2. Irregular wall thickening of the cecum. If not performed recently, recommend direct visualization with colonoscopy to exclude colonic neoplasm. 3. Consolidation throughout the  included left lower lobe with small pleural effusion. Trace right pleural effusion. 4. Anasarca with generalized body wall edema, confluent in the left abdomen and flank. 5. Enteric contrast in the distal esophagus, can be seen with reflux or slow transit. Aortic Atherosclerosis (ICD10-I70.0). Electronically Signed   By: Narda Rutherford M.D.   On: 09/23/2020 17:20   DG Chest 1 View  Result Date: 09/22/2020 CLINICAL DATA:  Status post left thoracentesis. EXAM: CHEST  1 VIEW COMPARISON:  September 21, 2020. FINDINGS: Stable cardiomegaly. Left pleural effusion is significantly smaller status post thoracentesis. No pneumothorax is noted. IMPRESSION: No pneumothorax status post left thoracentesis. Electronically Signed   By: Lupita Raider M.D.   On: 09/22/2020 17:02   DG Chest 2 View  Result Date: 09/28/2020 CLINICAL DATA:  Hypoxemia. EXAM: CHEST - 2 VIEW COMPARISON:  09/23/2020.  CT, 09/24/2020. FINDINGS: Stable mild enlargement of the cardiopericardial silhouette. Persistent opacity at the left lung base obscures portions of the left heart border and left hemidiaphragm. Left perihilar patchy airspace opacity noted on the prior radiograph has improved. There is mild opacity at the medial right lung base consistent with atelectasis. Remainder of the lungs is clear. No pneumothorax. Left inferior hemithorax chest tube is stable from the prior exams. IMPRESSION: 1. Mild interval improvement. Opacity noted in the left perihilar lung has decreased from the prior chest radiograph and chest CT. Opacity at the left lung base  is without significant change. No change in the left chest tube. 2. No pneumothorax. Electronically Signed   By: Amie Portland M.D.   On: 09/28/2020 10:57   DG Chest 2 View  Result Date: 09/12/2020 CLINICAL DATA:  CHF, leg swelling EXAM: CHEST - 2 VIEW COMPARISON:  06/12/2020 FINDINGS: Cardiomegaly. Mild, diffuse bilateral interstitial pulmonary opacity and small, layering bilateral pleural  effusions. The visualized skeletal structures are unremarkable. IMPRESSION: Cardiomegaly with mild, diffuse bilateral interstitial pulmonary opacity and small, layering bilateral pleural effusions, findings most consistent with edema. Electronically Signed   By: Lauralyn Primes M.D.   On: 09/12/2020 16:47   CT HEAD WO CONTRAST ( )  Result Date: 09/23/2020 CLINICAL DATA:  Altered mental status. EXAM: CT HEAD WITHOUT CONTRAST TECHNIQUE: Contiguous axial images were obtained from the base of the skull through the vertex without intravenous contrast. COMPARISON:  July 30, 2019 FINDINGS: Brain: No evidence of acute hemorrhage, hydrocephalus, extra-axial collection or mass lesion/mass effect. A large area of encephalomalacia is again seen within the left temporal and left parietal regions. Associated ex vacuo dilatation of the posterior aspect of the left lateral ventricle is noted. Vascular: No hyperdense vessel or unexpected calcification. Skull: A left frontal parietal craniotomy defect is seen. Sinuses/Orbits: No acute finding. Other: None. IMPRESSION: 1. No acute intracranial abnormality. 2. Evidence of prior left frontoparietal craniotomy with stable left temporal and left parietal lobe encephalomalacia. Electronically Signed   By: Aram Candela M.D.   On: 09/23/2020 22:32   CT CHEST WO CONTRAST  Result Date: 09/24/2020 CLINICAL DATA:  55 year old female with history of empyema. EXAM: CT CHEST WITHOUT CONTRAST TECHNIQUE: Multidetector CT imaging of the chest was performed following the standard protocol without IV contrast. COMPARISON:  Chest CT 02/13/2019. FINDINGS: Cardiovascular: Heart size is mildly enlarged. There is no significant pericardial fluid, thickening or pericardial calcification. Aortic atherosclerosis. No definite coronary artery calcifications. Mediastinum/Nodes: No pathologically enlarged mediastinal or hilar lymph nodes. Please note that accurate exclusion of hilar adenopathy is  limited on noncontrast CT scans. Esophagus is unremarkable in appearance. No axillary lymphadenopathy. Increasing asymmetric mass-like enlargement of the left lobe of the thyroid gland which is heterogeneous in appearance measuring 5.4 x 4.9 cm (axial image 4 of series 3) on today's examination as compared with 4.6 x 4.4 cm on prior study 02/13/2019. Lungs/Pleura: Trace right pleural effusion lying dependently. Moderate left pleural effusion which appears partially loculated, predominantly in the region of the major fissure. Areas of passive atelectasis are noted in the adjacent lung, including the left lower lobe which is nearly completely collapsed. Some degree of airspace consolidation is also suspected in the basal segments of the left lower lobe, poorly evaluated on today's noncontrast CT examination. Upper Abdomen: Postoperative changes of recent splenectomy with surgical drain in the upper left retroperitoneum and a small amount of residual postoperative fluid in the surrounding tissues. Musculoskeletal: There are no aggressive appearing lytic or blastic lesions noted in the visualized portions of the skeleton. IMPRESSION: 1. Complex left pleural effusion which is partially loculated, as above, with associated areas of atelectasis in the lungs, and probable airspace consolidation the basal segments of the left lower lobe concerning for pneumonia. 2. Mild cardiomegaly. 3. Asymmetric masslike enlargement of the left lobe of the thyroid gland which is increased compared to the prior study. Recommend thyroid US (ref: J Am Coll Radiol. 2015 Feb;12(2): 143-50). 4. Additional incidental findings, as above. 5. Aortic atherosclerosis. Aortic Atherosclerosis (ICD10-I70.0). Electronically Signed   By: Brayton Mars.D.  On: 09/24/2020 14:23   US Abdomen Limited  Result Date: 09/15/2020 CLINICAL DATA:  History of ITP, assess spleen size EXAM: ULTRASOUND ABDOMEN LIMITED COMPARISON:  CT abdomen/pelvis 06/26/2019  FINDINGS: The spleen size is normal measuring up to 9.8 cm x 10.7 cm x 6.1 cm. There is no focal lesion. IMPRESSION: No evidence of splenomegaly. Electronically Signed   By: Lesia Hausen M.D.   On: 09/15/2020 17:53   DG CHEST PORT 1 VIEW  Result Date: 09/23/2020 CLINICAL DATA:  Shortness of breath. EXAM: PORTABLE CHEST 1 VIEW COMPARISON:  Radiograph yesterday. Lung bases from abdominopelvic CT earlier today. FINDINGS: Left basilar consolidation. Slight increased opacity in the left mid lung zone from radiograph yesterday, suggesting layering effusion. Cardiomegaly is stable. Trace right pleural effusion on CT is not well seen. No pneumothorax. No pulmonary edema. IMPRESSION: Left basilar consolidation. Slight increased opacity in the left mid lung zone from radiograph yesterday, suggesting layering effusion. Electronically Signed   By: Narda Rutherford M.D.   On: 09/23/2020 20:02   DG CHEST PORT 1 VIEW  Result Date: 09/21/2020 CLINICAL DATA:  Chronic respiratory failure. Shortness of breath and fatigue. EXAM: PORTABLE CHEST 1 VIEW COMPARISON:  September 12, 2020 FINDINGS: Stable cardiomegaly. The hila and mediastinum are unchanged. No pneumothorax. There is opacity in the right base. Significant opacity seen on the left. Associated left effusion suspected. Mild increased interstitial markings in the right lung. IMPRESSION: 1. Significant opacification of the left chest. I suspect a moderate to large left pleural effusion with underlying atelectasis. Infiltrate not excluded. The findings are new since September 12, 2020. 2. Opacity in the right base could represent developing infiltrate or atelectasis. Recommend clinical correlation. 3. Probable mild pulmonary venous congestion. Electronically Signed   By: Gerome Sam III M.D.   On: 09/21/2020 13:46   US THYROID  Result Date: 09/25/2020 CLINICAL DATA:  Left thyroid mass by chest CT EXAM: THYROID ULTRASOUND TECHNIQUE: Ultrasound examination of the  thyroid gland and adjacent soft tissues was performed. COMPARISON:  09/24/2020, 03/08/2017 FINDINGS: Parenchymal Echotexture: Normal Isthmus: 8 mm Right lobe: 5.3 x 1.9 x 1.8 cm Left lobe: 6.9 x 5.5 x 4.7 cm _________________________________________________________ Estimated total number of nodules >/= 1 cm: 1 Number of spongiform nodules >/=  2 cm not described below (TR1): 0 Number of mixed cystic and solid nodules >/= 1.5 cm not described below (TR2): 0 _________________________________________________________ Nodule # 1: Location: Left; Mid Maximum size: 5.7, previously 4.3 cm; Other 2 dimensions: 5.3 x 4.0 cm Composition: solid/almost completely solid (2) Echogenicity: isoechoic (1) Shape: not taller-than-wide (0) Margins: ill-defined (0) Echogenic foci: none (0) ACR TI-RADS total points: 3. ACR TI-RADS risk category: TR3 (3 points). ACR TI-RADS recommendations: **Given size (>/= 2.5 cm) and appearance, fine needle aspiration of this mildly suspicious nodule should be considered based on TI-RADS criteria. _________________________________________________________ Additional benign stable subcentimeter hypoechoic nodules in the right thyroid all measuring 7 mm or less. No hypervascularity.  No regional adenopathy. IMPRESSION: Slightly larger 5.7 cm left mid thyroid TR 3 nodule continues to meet criteria for biopsy as above. The above is in keeping with the ACR TI-RADS recommendations - J Am Coll Radiol 2017;14:587-595. Electronically Signed   By: Judie Petit.  Shick M.D.   On: 09/25/2020 11:20   IR THORACENTESIS ASP PLEURAL SPACE W/IMG GUIDE  Result Date: 09/22/2020 INDICATION: Pleural Effusion EXAM: ULTRASOUND GUIDED DIAGNOSTIC AND THERAPEUTIC THORACENTESIS MEDICATIONS: None. COMPLICATIONS: None immediate. PROCEDURE: An ultrasound guided thoracentesis was thoroughly discussed with the patient and questions answered. The  benefits, risks, alternatives and complications were also discussed. The patient understands and  wishes to proceed with the procedure. Written consent was obtained. Ultrasound was performed to localize and mark an adequate pocket of fluid in the left chest. The area was then prepped and draped in the normal sterile fashion. 1% Lidocaine was used for local anesthesia. Under ultrasound guidance a 8 Fr Safe-T-Centesis catheter was introduced. Thoracentesis was performed. The catheter was removed and a dressing applied. FINDINGS: A total of approximately 500cc of dark yellow fluid was removed. Samples were sent to the laboratory as requested by the clinical team. IMPRESSION: Successful ultrasound guided diagnostic and therapeutic thoracentesis yielding 500cc of pleural fluid. Electronically Signed   By: Marliss Coots M.D.   On: 09/22/2020 16:37    ASSESSMENT AND PLAN: 1.  History of ITP now status post splenectomy 2.  Leukocytosis, improving 3.  Anemia 4.  Thrombocytosis, likely reactive 5.  Sepsis due to pneumonia, left pleural effusion, MRSE bacteremia, improving 6.  Acute respiratory failure with hypoxia, improved 7.  Cecal wall thickening 8.  Hypertension 9.  CKD 10.  Diabetes mellitus  -[name] has a history of ITP and has received multiple therapies in the past for this.  She is now status post splenectomy with significant improvement in her platelet count.  She continues to slowly recover from her surgery.  She is planning to go to [facility] for rehab upon discharge.  We will plan for outpatient follow-up to monitor her labs closely.  She currently has an appointment scheduled for 10/3 and I have sent a scheduling message to delay her appointment until mid-to-late October because she will still be at rehab.  She will receive an updated appointment once it is rescheduled. -She has leukocytosis which may be related to infection/reactive.  Slowly improving.  Continue to monitor closely. -Her hemoglobin is stable.  No need for transfusion. -The patient will continue on Augmentin until 10/20/2020  for treatment of her bacteremia. -She has been seen by pulmonology this admission with plans for outpatient follow-up for ongoing monitoring of her pleural effusion. -She was noted to have a cecal wall thickening on CT scan.  GI has been consulted and will plan for colonoscopy when she is more medically stable-possibly as outpatient. -Management of chronic medical conditions per hospitalist.  Future Appointments  Date Time Provider Department Center  10/06/2020 11:15 AM [facility] None  10/06/2020 11:45 AM Serena Croissant, MD [facility] None      LOS: 18 days   Clenton Pare, DNP, AGPCNP-BC, AOCNP 09/30/20

## 2020-09-30 NOTE — TOC Progression Note (Signed)
Transition of Care [facility]) - Progression Note    Patient Details  Name: [name] MRN: [id number] Date of Birth: 1965/06/15  Transition of Care [facility]) CM/SW Contact  Leone Haven, RN Phone Number: 09/30/2020, 3:23 PM  Clinical Narrative:    [facility] has an auth for patient to go to CIR but they just do not have a bed today, hopeful for tomorrow.         Expected Discharge Plan and Services                                                 Social Determinants of Health (SDOH) Interventions Food Insecurity Interventions: Intervention Not Indicated Financial Strain Interventions: Intervention Not Indicated Housing Interventions: Intervention Not Indicated Transportation Interventions: [facility]  Readmission Risk Interventions Readmission Risk Prevention Plan 03/11/2020 04/13/2019 11/13/2018  Transportation Screening Complete Complete Complete  PCP or Specialist Appt within 5-7 Days - - Complete  PCP or Specialist Appt within 3-5 Days Complete Complete -  Home Care Screening - - Complete  Medication Review (RN CM) - - Complete  HRI or Home Care Consult Complete Complete -  Social Work Consult for Recovery Care Planning/Counseling Complete Complete -  Palliative Care Screening Not Applicable Not Applicable -  Medication Review Oceanographer) Complete Complete -  Some recent data might be hidden

## 2020-09-30 NOTE — Telephone Encounter (Signed)
Needs hospital follow up in 4 weeks for pleural effusion. Going to inpatient rehab first, then will be discharged home.

## 2020-09-30 NOTE — Telephone Encounter (Signed)
Appt has been scheduled for pt to have HFU appt with Dr. Thora Lance. Since pt is still in the hospital, once pt is discharged from hospital, appt should show up on AVS with office address.  If pt needs to cancel/reschedule appt, pt can call office. Nothing further needed.

## 2020-09-30 NOTE — Progress Notes (Signed)
Inpatient Rehabilitation Admissions Coordinator   I met at bedside with patient . I await [facility] bed to admit her . She is aware and in agreement.  Danne Baxter, RN, MSN Rehab Admissions Coordinator [phone] 09/30/2020 1:42 PM

## 2020-09-30 NOTE — Progress Notes (Signed)
Progress Note  12 Days Post-Op  Subjective: Patient tolerating diet and denies nausea. Reports BM on Sunday. She reports she has stable LUQ pain around drain site.   Objective: Vital signs in last 24 hours: Temp:  [98.7 F (37.1 C)-99 F (37.2 C)] 99 F (37.2 C) (09/27 0753) Pulse Rate:  [79-86] 79 (09/27 0753) Resp:  [20-21] 21 (09/27 0753) BP: (150-155)/(59-64) 152/64 (09/27 0753) SpO2:  [91 %-94 %] 94 % (09/27 0753) Weight:  [107.5 kg] 107.5 kg (09/27 0410) Last BM Date: 09/29/20  Intake/Output from previous day: 09/26 0701 - 09/27 0700 In: 880 [P.O.:880] Out: 1610 [Urine:1600; Drains:10] Intake/Output this shift: No intake/output data recorded.  PE: Gen: Alert, NAD, pleasant, sitting up in bed having breakfast Pulm: O2 via [place] Normal rate and effort, decreased lung sounds left base Abd: Soft, obese, mild tenderness of the epigastrium over incision with some firmness right lateral upper incision (suspect small hematoma) without signs of cellulitis or infection, flanks are firm and edematous bilaterally, mild tenderness around LUQ drain without rebound or guarding. Drain with snat cloudy, tan output.  Ext:  R BKA. Psych: A&Ox3  Skin: no rashes noted, warm and dry   Lab Results:  Recent Labs    09/29/20 0217 09/30/20 0458  WBC 20.3* 17.4*  HGB 8.3* 8.4*  HCT 28.3* 28.7*  PLT 812* 864*   BMET Recent Labs    09/29/20 0217 09/30/20 0458  NA 139 137  K 3.8 3.8  CL 106 104  CO2 24 25  GLUCOSE 166* 100*  BUN 25* 19  CREATININE 1.21* 0.98  CALCIUM 8.3* 8.5*   PT/INR No results for input(s): LABPROT, INR in the last 72 hours. CMP     Component Value Date/Time   NA 137 09/30/2020 0458   NA 139 05/16/2019 1522   NA 138 09/30/2015 1118   K 3.8 09/30/2020 0458   K 4.6 09/30/2015 1118   CL 104 09/30/2020 0458   CO2 25 09/30/2020 0458   CO2 19 (L) 09/30/2015 1118   GLUCOSE 100 (H) 09/30/2020 0458   GLUCOSE 200 (H) 09/30/2015 1118   BUN 19 09/30/2020  0458   BUN 23 05/16/2019 1522   BUN 22.3 09/30/2015 1118   CREATININE 0.98 09/30/2020 0458   CREATININE 2.63 (H) 06/12/2020 0755   CREATININE 1.3 (H) 09/30/2015 1118   CALCIUM 8.5 (L) 09/30/2020 0458   CALCIUM 9.6 09/30/2015 1118   PROT 6.0 (L) 09/25/2020 0358   PROT 8.2 05/16/2019 1522   ALBUMIN 2.2 (L) 09/30/2020 0458   ALBUMIN 4.2 05/16/2019 1522   AST 17 09/25/2020 0358   AST 15 06/12/2020 0755   ALT 13 09/25/2020 0358   ALT 9 06/12/2020 0755   ALKPHOS 74 09/25/2020 0358   BILITOT 0.7 09/25/2020 0358   BILITOT 0.3 06/12/2020 0755   GFRNONAA >60 09/30/2020 0458   GFRNONAA 21 (L) 06/12/2020 0755   GFRAA 59 (L) 09/19/2019 1449   Lipase     Component Value Date/Time   LIPASE 23 06/26/2019 0124       Studies/Results: DG Chest 2 View  Result Date: 09/28/2020 CLINICAL DATA:  Hypoxemia. EXAM: CHEST - 2 VIEW COMPARISON:  09/23/2020.  CT, 09/24/2020. FINDINGS: Stable mild enlargement of the cardiopericardial silhouette. Persistent opacity at the left lung base obscures portions of the left heart border and left hemidiaphragm. Left perihilar patchy airspace opacity noted on the prior radiograph has improved. There is mild opacity at the medial right lung base consistent with atelectasis. Remainder of  the lungs is clear. No pneumothorax. Left inferior hemithorax chest tube is stable from the prior exams. IMPRESSION: 1. Mild interval improvement. Opacity noted in the left perihilar lung has decreased from the prior chest radiograph and chest CT. Opacity at the left lung base is without significant change. No change in the left chest tube. 2. No pneumothorax. Electronically Signed   By: Amie Portland M.D.   On: 09/28/2020 10:57    Anti-infectives: Anti-infectives (From admission, onward)    Start     Dose/Rate Route Frequency Ordered Stop   09/29/20 1800  amoxicillin-clavulanate (AUGMENTIN) 875-125 MG per tablet 1 tablet        1 tablet Oral Every 12 hours 09/29/20 0728 10/20/20 2159    09/27/20 1045  vancomycin (VANCOREADY) IVPB 1750 mg/350 mL  Status:  Discontinued        1,750 mg 175 mL/hr over 120 Minutes Intravenous Every 24 hours 09/27/20 1040 09/27/20 1043   09/27/20 1045  vancomycin (VANCOREADY) IVPB 1250 mg/250 mL        1,250 mg 166.7 mL/hr over 90 Minutes Intravenous Every 24 hours 09/27/20 1043 09/29/20 2031   09/25/20 1200  vancomycin (VANCOREADY) IVPB 1750 mg/350 mL  Status:  Discontinued        1,750 mg 175 mL/hr over 120 Minutes Intravenous Every 48 hours 09/23/20 1056 09/27/20 1040   09/23/20 1400  ceFEPIme (MAXIPIME) 2 g in sodium chloride 0.9 % 100 mL IVPB        2 g 200 mL/hr over 30 Minutes Intravenous Every 12 hours 09/23/20 1047 09/29/20 1431   09/23/20 1400  metroNIDAZOLE (FLAGYL) tablet 500 mg  Status:  Discontinued        500 mg Oral Every 12 hours 09/23/20 1047 09/24/20 1028   09/23/20 1200  vancomycin (VANCOREADY) IVPB 2000 mg/400 mL        2,000 mg 200 mL/hr over 120 Minutes Intravenous  Once 09/23/20 1047 09/23/20 1326   09/21/20 1400  piperacillin-tazobactam (ZOSYN) IVPB 3.375 g  Status:  Discontinued        3.375 g 12.5 mL/hr over 240 Minutes Intravenous Every 8 hours 09/21/20 1122 09/23/20 1047   09/18/20 1115  cefoTEtan (CEFOTAN) 2 g in sodium chloride 0.9 % 100 mL IVPB        2 g 200 mL/hr over 30 Minutes Intravenous  Once 09/18/20 1112 09/18/20 1250   09/18/20 1114  sodium chloride 0.9 % with cefoTEtan (CEFOTAN) ADS Med       Note to Pharmacy: Janene Harvey   : cabinet override      09/18/20 1114 09/18/20 1253        Assessment/Plan POD#12 ITP/Thrombocytopenia s/p Hand-assisted laparoscopic splenectomy 6/15 Dr. Donell Beers - Received vaccines pre-op - WBC 17 from 20 today. CT 9/20 reassuring and without organized abscess. Other etiologies of leukocytosis could be reactive s/p splenectomy, or pulmonary as patient has been treated for HAP and loculated L pleural effusion as well. Repeat blood Cx 9/21 NGTD so bacteremia seems  resolved. Abdominal exam overall benign with mild, appropriate tenderness. Will discuss timing of repeat abdominal CT with MD.  - Awaiting drain amylase 9/25 to r/o pancreatic leak - Cont JP drain - Suspect small hematoma adjacent to upper incision. No evidence of cellulitis  - Mobilize. Therapies following. CIR following.   FEN - CM diet; Mg replacement per primary  VTE - SCD, Lovenox  ID - Vanc/Cefepime until 9/26 per ID, then PO augmentin Foley - out, voiding   - Per  TRH -  Staph epi bacteremia - ID following. Repeat blood cx's without growth HAP Loculated left pleural effusion s/p thora 9/19. Cx neg. Loculated pleural effusion on CT 9/21 Cecal wall thickening - noted on CT, GI following and determining timing of colonoscopy  Thyroid nodule - 5.7 cm.  TSH 6.8. May need FNA outpatient ABL anemia - hgb stable at 8.4 this AM WC bound due to RLE BKA DM CHF HTN H/O CVA AKI - Improving, Cr 0.98 today   LOS: 18 days    Juliet Rude, [facility] 09/30/2020, 9:28 AM Please see Amion for pager number during day hours 7:00am-4:30pm

## 2020-09-30 NOTE — Progress Notes (Signed)
We were consulted on 09/24/2020 to evaluate cecal wall thickening by CT scan in addition to iron deficiency anemia.  Hospitalization complex initially admitted for ITP/pancytopenia and eventually underwent splenectomy 09/18/2020.  Postoperative course has been complex with CHF exacerbation, sepsis due to HAP, parapneumonic effusion, MRSE bacteremia.  Recent consult to PCCM.  At this point patient remains too ill for colonoscopy.  Colonoscopy is remains definitely recommended to evaluate the cecal abnormality as well as the iron deficiency.  She also had a colonoscopy with a "fair" prep in 2017 to the cecum.  GI will sign off for now but please notify GI when she becomes medically appropriate for colonoscopy or when she is nearing discharge so that appropriate outpatient colonoscopy can be arranged.  We remain available, call if questions

## 2020-09-30 NOTE — Progress Notes (Signed)
PROGRESS NOTE  HALLA CHOPP UMP:[id number] DOB: [birth year]   PCP: Annita Brod, MD  Patient is from: Home.  DOA: 09/12/2020 LOS: 18  Chief complaints:  Chief Complaint  Patient presents with   Leg Swelling     Brief Narrative / Interim history: 55 year old F with PMH of ITP, DM-2, CKD-3, diastolic CHF, ICH, PVD, right BKA, neurogenic bladder/chronic Foley presenting with shortness of breath, fatigue, physical deconditioning and bruising for about a month, and admitted for ITP/pancytopenia with Hgb of 4.5, platelet <5 and WBC 2.8, and acute on chronic diastolic CHF.  Heme-onc consulted and she was treated with blood transfusion, IVIG, EPO, IV iron, Nplate, steroids and splenectomy with improvement in her pancytopenia.  She was also diuresed with IV Lasix for CHF exacerbation.  Hospital course noteworthy of sepsis/acute hypoxic RF in the setting of HAP/exudative loculated left pleural effusion/MRSE bacteremia.  Completed 7 days of IV Vanc and Cefepime and started  3 weeks of Augmentin which will 10/17.   PCCM and general surgery following.  Therapy recommended CIR.  Subjective: Seen and examined earlier this morning.  No major events overnight of this morning.  No complaints.  Breathing seems to have improved.  She is now on 1 L saturating in mid 90s.  Denies chest pain, dizziness, abdominal pain or UTI symptoms.   Objective: Vitals:   09/29/20 2338 09/29/20 2339 09/30/20 0410 09/30/20 0753  BP: (!) 155/64  (!) 151/62 (!) 152/64  Pulse: 80  86 79  Resp:  20 (!) 21 (!) 21  Temp: 99 F (37.2 C)  98.8 F (37.1 C) 99 F (37.2 C)  TempSrc: Oral  Oral Oral  SpO2: 94%  91% 94%  Weight:   107.5 kg   Height:        Intake/Output Summary (Last 24 hours) at 09/30/2020 1226 Last data filed at 09/30/2020 0418 Gross per 24 hour  Intake 240 ml  Output 1610 ml  Net -1370 ml   Filed Weights   09/27/20 0357 09/29/20 0326 09/30/20 0410  Weight: 109.6 kg 108.8 kg 107.5 kg     Examination:  GENERAL: No apparent distress.  Nontoxic. HEENT: MMM.  Vision and hearing grossly intact.  NECK: Supple.  No apparent JVD.  RESP: 94 to 97% on 1 L.  No IWOB.  Improved LLL aeration.  About 500 cc on IS CVS:  RRR. Heart sounds normal.  ABD/GI/GU: BS+. Abd soft, NTND.  No significant output in JP drain. MSK/EXT:  Moves extremities. No apparent deformity.  Trace edema in LLE SKIN: no apparent skin lesion or wound NEURO: Awake and alert. Oriented appropriately.  No apparent focal neuro deficit. PSYCH: Calm. Normal affect.     Procedures:  9/15-splenectomy  Microbiology summarized: 9/9-COVID-19 and influenza PCR nonreactive. 9/18-blood culture with MRSE 9/21-repeat blood cultures NGTD.  Assessment & Plan: ITP/pancytopenia/symptomatic anemia-now with leukocytosis and thrombocytosis.  Hgb stable at 8.3. -Hgb of 4.5, platelet <5 and WBC 2.8 -S/p  IVIG, Nplate, steroids, 1 unit of PRBC and splenectomy with appropriate vaccines on 9/15 -General surgery following-following fluid amylase levels to exclude pancreatic leak -She may need hematology follow-up outpatient   Sepsis due to HAP/large exudative left pleural effusion/MRSE bacteremia -Zosyn>> cefepime, Flagyl and vancomycin> 9/26> p.o. Augmentin >>> 10/17 -See below for pleural effusion.  Exudative left pleural effusion-s/p left-Thora on 9/19 with removal of 500 cc.  Cultures negative.  Subsequent CT on 9/21 with partially loculated left pleural effusion with associated areas of atelectasis in the lungs.  She has  crackles and diminished air over LLL but improving.  Leukocytosis started to improve as well.  Antibiotics as above.  -9/25-CVTS, Dr. Cliffton Asters recommended pulmonary consult -Appreciate help by pulm-see their recommendations. -Continue antibiotics as above -Pulmonology to arrange outpatient follow-up  Acute respiratory failure with hypoxia-multifactorial as above.  Carries history of chronic hypoxic RF  but she says she never used oxygen.  Saturating in mid 90s on 1 L by [place]. -Wean oxygen as able -Encourage IS/OOB/PT/OT -Antibiotics as above -Appreciate help by pulm  Acute on chronic diastolic CHF: reportedly out of her Lasix for 1 month POA.  It appears that she was taking 160 mg of Lasix daily.  Restarted on p.o. Lasix 40 mg twice daily.  About 1.6 L UOP/24 hours.  Net 13 L so far..  Weight down 10 pounds.  Creatinine and BNP improved.  Still with trace edema. -Continue Lasix 40 mg twice daily -Monitor fluid status, renal functions and electrolytes  Cecal wall thickening: Noted on CT abdomen and pelvis.  GI consulted.   -Colonoscopy once she is more stable.  GI to arrange, likely outpatient  Essential hypertension: SBP in 150s. -Continue Toprol-XL and Lasix.   -Resume home Imdur. -Continue holding amlodipine   AKI on CKD IIIB/obstructive uropathy-resolved. Recent Labs    09/22/20 2346 09/23/20 0939 09/23/20 2008 09/24/20 0342 09/25/20 0358 09/26/20 0227 09/27/20 0336 09/28/20 0334 09/29/20 0217 09/30/20 0458  BUN 46* 45* 44* 41* 36* 32* 30* 29* 25* 19  CREATININE 2.10* 1.99* 2.14* 2.06* 1.71* 1.47* 1.42* 1.31* 1.21* 0.98  -Continue monitoring   Controlled DM-2 with hyperglycemia: A1c 5.5% but after PRBC.  Was 6.2% in 06/2020. Recent Labs  Lab 09/29/20 2007 09/29/20 2334 09/30/20 0409 09/30/20 0756 09/30/20 1106  GLUCAP 148* 116* 106* 104* 155*  -Continue SSI-moderate.   Debility/physical deconditioning/right BKA: Has a wheelchair at home. -Therapy recommended CIR   Hyponatremia: Resolved.  Hypomagnesemia: Mg 1.6. -IV magnesium sulfate 2 g x 1  Leukocytosis/thrombocytosis: Leukocytosis started to improve.  Thrombocytosis remains elevated -Management as above.   Left thyroid nodule: 5.7 cm.  TSH 6.8.  -May need FNA outpatient.  Body mass index is 37.12 kg/m.         DVT prophylaxis:  SCDs Start: 09/12/20 1951    On subcu Lovenox  Code Status: Full  code Family Communication: Patient and/or RN. Available if any question.  Level of care: Med-Surg Status is: Inpatient  Remains inpatient appropriate because:Unsafe d/c plan  Dispo: The patient is from: Home              Anticipated d/c is to: CIR              Patient currently is medically stable to d/c.   Difficult to place patient No       Consultants:  General surgery Infectious disease Hematology/oncology CVTS over the phone Pulmonology   Sch Meds:  Scheduled Meds:  amoxicillin-clavulanate  1 tablet Oral Q12H   bethanechol  10 mg Oral TID   enoxaparin (LOVENOX) injection  50 mg Subcutaneous Q24H   folic acid  2 mg Oral Daily   furosemide  40 mg Oral BID   gabapentin  400 mg Oral BID   insulin aspart  0-15 Units Subcutaneous Q4H   metoprolol succinate  25 mg Oral Daily   pantoprazole  40 mg Oral Daily   rosuvastatin  20 mg Oral Daily   Continuous Infusions:   PRN Meds:.acetaminophen, fentaNYL (SUBLIMAZE) injection, HYDROmorphone (DILAUDID) injection, loperamide, ondansetron (ZOFRAN) IV,  oxyCODONE, polyvinyl alcohol  Antimicrobials: Anti-infectives (From admission, onward)    Start     Dose/Rate Route Frequency Ordered Stop   09/29/20 1800  amoxicillin-clavulanate (AUGMENTIN) 875-125 MG per tablet 1 tablet        1 tablet Oral Every 12 hours 09/29/20 0728 10/20/20 2159   09/27/20 1045  vancomycin (VANCOREADY) IVPB 1750 mg/350 mL  Status:  Discontinued        1,750 mg 175 mL/hr over 120 Minutes Intravenous Every 24 hours 09/27/20 1040 09/27/20 1043   09/27/20 1045  vancomycin (VANCOREADY) IVPB 1250 mg/250 mL        1,250 mg 166.7 mL/hr over 90 Minutes Intravenous Every 24 hours 09/27/20 1043 09/29/20 2031   09/25/20 1200  vancomycin (VANCOREADY) IVPB 1750 mg/350 mL  Status:  Discontinued        1,750 mg 175 mL/hr over 120 Minutes Intravenous Every 48 hours 09/23/20 1056 09/27/20 1040   09/23/20 1400  ceFEPIme (MAXIPIME) 2 g in sodium chloride 0.9 % 100 mL  IVPB        2 g 200 mL/hr over 30 Minutes Intravenous Every 12 hours 09/23/20 1047 09/29/20 1431   09/23/20 1400  metroNIDAZOLE (FLAGYL) tablet 500 mg  Status:  Discontinued        500 mg Oral Every 12 hours 09/23/20 1047 09/24/20 1028   09/23/20 1200  vancomycin (VANCOREADY) IVPB 2000 mg/400 mL        2,000 mg 200 mL/hr over 120 Minutes Intravenous  Once 09/23/20 1047 09/23/20 1326   09/21/20 1400  piperacillin-tazobactam (ZOSYN) IVPB 3.375 g  Status:  Discontinued        3.375 g 12.5 mL/hr over 240 Minutes Intravenous Every 8 hours 09/21/20 1122 09/23/20 1047   09/18/20 1115  cefoTEtan (CEFOTAN) 2 g in sodium chloride 0.9 % 100 mL IVPB        2 g 200 mL/hr over 30 Minutes Intravenous  Once 09/18/20 1112 09/18/20 1250   09/18/20 1114  sodium chloride 0.9 % with cefoTEtan (CEFOTAN) ADS Med       Note to Pharmacy: Janene Harvey   : cabinet override      09/18/20 1114 09/18/20 1253        I have personally reviewed the following labs and images: CBC: Recent Labs  Lab 09/23/20 2008 09/24/20 0342 09/25/20 0358 09/26/20 0227 09/27/20 0336 09/28/20 0334 09/29/20 0217 09/30/20 0458  WBC 17.9* 17.0* 14.2* 16.8* 15.6* 19.2* 20.3* 17.4*  NEUTROABS 14.9* 12.6* 9.6* 11.8* 10.9*  --   --   --   HGB 8.4* 9.2* 9.0* 8.1* 8.0* 8.0* 8.3* 8.4*  HCT 28.7* 31.9* 29.7* 27.5* 27.7* 28.0* 28.3* 28.7*  MCV 87.2 87.4 85.8 86.5 86.8 86.7 86.3 85.4  PLT 723* 757* 790* 746* 755* 810* 812* 864*   BMP &GFR Recent Labs  Lab 09/26/20 0227 09/27/20 0336 09/28/20 0334 09/29/20 0217 09/30/20 0458  NA 136 137 138 139 137  K 3.5 3.5 4.1 3.8 3.8  CL 103 104 105 106 104  CO2 25 26 25 24 25   GLUCOSE 109* 103* 103* 166* 100*  BUN 32* 30* 29* 25* 19  CREATININE 1.47* 1.42* 1.31* 1.21* 0.98  CALCIUM 7.7* 7.9* 8.1* 8.3* 8.5*  MG 1.7  --  1.7 1.5* 1.6*  PHOS  --   --  2.4* 2.2* 2.3*   Estimated Creatinine Clearance: 83.8 mL/min (by C-G formula based on SCr of 0.98 mg/dL). Liver & Pancreas: Recent  Labs  Lab 09/23/20 2008 09/24/20 8144  09/25/20 0358 09/28/20 0334 09/29/20 0217 09/30/20 0458  AST 28 24 17   --   --   --   ALT 16 16 13   --   --   --   ALKPHOS 75 83 74  --   --   --   BILITOT 0.5 0.6 0.7  --   --   --   PROT 6.2* 6.4* 6.0*  --   --   --   ALBUMIN 2.0* 2.2* 2.1* 2.1* 2.2* 2.2*   No results for input(s): LIPASE, AMYLASE in the last 168 hours.  No results for input(s): AMMONIA in the last 168 hours. Diabetic: Recent Labs    09/28/20 0334  HGBA1C 5.5   Recent Labs  Lab 09/29/20 2007 09/29/20 2334 09/30/20 0409 09/30/20 0756 09/30/20 1106  GLUCAP 148* 116* 106* 104* 155*   Cardiac Enzymes: No results for input(s): CKTOTAL, CKMB, CKMBINDEX, TROPONINI in the last 168 hours. No results for input(s): PROBNP in the last 8760 hours. Coagulation Profile: Recent Labs  Lab 09/24/20 1035  INR 1.3*   Thyroid Function Tests: No results for input(s): TSH, T4TOTAL, FREET4, T3FREE, THYROIDAB in the last 72 hours.  Lipid Profile: No results for input(s): CHOL, HDL, LDLCALC, TRIG, CHOLHDL, LDLDIRECT in the last 72 hours. Anemia Panel: Recent Labs    09/28/20 0334  VITAMINB12 177*  FOLATE 26.3  FERRITIN 416*  TIBC 214*  IRON 31  RETICCTPCT 1.7   Urine analysis:    Component Value Date/Time   COLORURINE YELLOW 09/22/2020 1212   APPEARANCEUR CLEAR 09/22/2020 1212   LABSPEC <1.005 (L) 09/22/2020 1212   PHURINE 5.5 09/22/2020 1212   GLUCOSEU NEGATIVE 09/22/2020 1212   HGBUR NEGATIVE 09/22/2020 1212   BILIRUBINUR NEGATIVE 09/22/2020 1212   KETONESUR NEGATIVE 09/22/2020 1212   PROTEINUR NEGATIVE 09/22/2020 1212   UROBILINOGEN 2.0 (H) 07/08/2011 0051   NITRITE NEGATIVE 09/22/2020 1212   LEUKOCYTESUR SMALL (A) 09/22/2020 1212   Sepsis Labs: Invalid input(s): PROCALCITONIN, LACTICIDVEN  Microbiology: Recent Results (from the past 240 hour(s))  Culture, blood (routine x 2)     Status: Abnormal   Collection Time: 09/21/20 10:57 AM   Specimen: BLOOD   Result Value Ref Range Status   Specimen Description BLOOD SITE NOT SPECIFIED  Final   Special Requests   Final    BOTTLES DRAWN AEROBIC AND ANAEROBIC Blood Culture adequate volume   Culture  Setup Time   Final    AEROBIC BOTTLE ONLY GRAM POSITIVE COCCI IN CLUSTERS CRITICAL VALUE NOTED.  VALUE IS CONSISTENT WITH PREVIOUSLY REPORTED AND CALLED VALUE. Performed at [facility], [address], [place], [address] [place]    Culture STAPHYLOCOCCUS EPIDERMIDIS (A)  Final   Report Status 09/26/2020 FINAL  Final   Organism ID, Bacteria STAPHYLOCOCCUS EPIDERMIDIS  Final      Susceptibility   Staphylococcus epidermidis - MIC*    CIPROFLOXACIN <=0.5 SENSITIVE Sensitive     ERYTHROMYCIN >=8 RESISTANT Resistant     GENTAMICIN <=0.5 SENSITIVE Sensitive     OXACILLIN >=4 RESISTANT Resistant     TETRACYCLINE >=16 RESISTANT Resistant     VANCOMYCIN 2 SENSITIVE Sensitive     TRIMETH/SULFA <=10 SENSITIVE Sensitive     CLINDAMYCIN <=0.25 SENSITIVE Sensitive     RIFAMPIN <=0.5 SENSITIVE Sensitive     Inducible Clindamycin NEGATIVE Sensitive     * STAPHYLOCOCCUS EPIDERMIDIS  Culture, blood (routine x 2)     Status: Abnormal   Collection Time: 09/21/20 11:20 AM   Specimen: BLOOD  RIGHT HAND  Result Value Ref Range Status   Specimen Description BLOOD RIGHT HAND  Final   Special Requests   Final    BOTTLES DRAWN AEROBIC ONLY Blood Culture adequate volume   Culture  Setup Time   Final    GRAM POSITIVE COCCI IN CLUSTERS AEROBIC BOTTLE ONLY CRITICAL RESULT CALLED TO, READ BACK BY AND VERIFIED WITHCeledonio Miyamoto [facility] 1610 09/22/20 A BROWNING Performed at [facility], [address], [place], [place] [address]    Culture STAPHYLOCOCCUS EPIDERMIDIS (A)  Final   Report Status 09/24/2020 FINAL  Final   Organism ID, Bacteria STAPHYLOCOCCUS EPIDERMIDIS  Final      Susceptibility   Staphylococcus epidermidis - MIC*    CIPROFLOXACIN <=0.5 SENSITIVE Sensitive     ERYTHROMYCIN >=8 RESISTANT  Resistant     GENTAMICIN <=0.5 SENSITIVE Sensitive     OXACILLIN >=4 RESISTANT Resistant     TETRACYCLINE >=16 RESISTANT Resistant     VANCOMYCIN 2 SENSITIVE Sensitive     TRIMETH/SULFA <=10 SENSITIVE Sensitive     CLINDAMYCIN <=0.25 SENSITIVE Sensitive     RIFAMPIN <=0.5 SENSITIVE Sensitive     Inducible Clindamycin NEGATIVE Sensitive     * STAPHYLOCOCCUS EPIDERMIDIS  Blood Culture ID Panel (Reflexed)     Status: Abnormal   Collection Time: 09/21/20 11:20 AM  Result Value Ref Range Status   Enterococcus faecalis NOT DETECTED NOT DETECTED Final   Enterococcus Faecium NOT DETECTED NOT DETECTED Final   Listeria monocytogenes NOT DETECTED NOT DETECTED Final   Staphylococcus species DETECTED (A) NOT DETECTED Final    Comment: CRITICAL RESULT CALLED TO, READ BACK BY AND VERIFIED WITH: Celedonio Miyamoto PHARMD 1627 09/22/20 A BROWNING    Staphylococcus aureus (BCID) NOT DETECTED NOT DETECTED Final   Staphylococcus epidermidis DETECTED (A) NOT DETECTED Final    Comment: Methicillin (oxacillin) resistant coagulase negative staphylococcus. Possible blood culture contaminant (unless isolated from more than one blood culture draw or clinical case suggests pathogenicity). No antibiotic treatment is indicated for blood  culture contaminants. CRITICAL RESULT CALLED TO, READ BACK BY AND VERIFIED WITH: Celedonio Miyamoto PHARMD 1627 09/22/20 A BROWNING    Staphylococcus lugdunensis NOT DETECTED NOT DETECTED Final   Streptococcus species NOT DETECTED NOT DETECTED Final   Streptococcus agalactiae NOT DETECTED NOT DETECTED Final   Streptococcus pneumoniae NOT DETECTED NOT DETECTED Final   Streptococcus pyogenes NOT DETECTED NOT DETECTED Final   A.calcoaceticus-baumannii NOT DETECTED NOT DETECTED Final   Bacteroides fragilis NOT DETECTED NOT DETECTED Final   Enterobacterales NOT DETECTED NOT DETECTED Final   Enterobacter cloacae complex NOT DETECTED NOT DETECTED Final   Escherichia coli NOT DETECTED NOT DETECTED  Final   Klebsiella aerogenes NOT DETECTED NOT DETECTED Final   Klebsiella oxytoca NOT DETECTED NOT DETECTED Final   Klebsiella pneumoniae NOT DETECTED NOT DETECTED Final   Proteus species NOT DETECTED NOT DETECTED Final   Salmonella species NOT DETECTED NOT DETECTED Final   Serratia marcescens NOT DETECTED NOT DETECTED Final   Haemophilus influenzae NOT DETECTED NOT DETECTED Final   Neisseria meningitidis NOT DETECTED NOT DETECTED Final   Pseudomonas aeruginosa NOT DETECTED NOT DETECTED Final   Stenotrophomonas maltophilia NOT DETECTED NOT DETECTED Final   Candida albicans NOT DETECTED NOT DETECTED Final   Candida auris NOT DETECTED NOT DETECTED Final   Candida glabrata NOT DETECTED NOT DETECTED Final   Candida krusei NOT DETECTED NOT DETECTED Final   Candida parapsilosis NOT DETECTED NOT DETECTED Final   Candida tropicalis NOT DETECTED NOT  DETECTED Final   Cryptococcus neoformans/gattii NOT DETECTED NOT DETECTED Final   Methicillin resistance mecA/C DETECTED (A) NOT DETECTED Final    Comment: CRITICAL RESULT CALLED TO, READ BACK BY AND VERIFIED WITHCeledonio Miyamoto [facility] 7124 09/22/20 A BROWNING Performed at [facility], [address], [place], [place] [address]   Culture, body fluid w Gram Stain-bottle     Status: None   Collection Time: 09/22/20  4:51 PM   Specimen: Pleura  Result Value Ref Range Status   Specimen Description PLEURAL  Final   Special Requests NONE  Final   Culture   Final    NO GROWTH 7 DAYS Performed at [facility], [address], [place], [place] [address]    Report Status 09/29/2020 FINAL  Final  Gram stain     Status: None   Collection Time: 09/22/20  4:51 PM   Specimen: Pleura  Result Value Ref Range Status   Specimen Description PLEURAL  Final   Special Requests NONE  Final   Gram Stain   Final    ABUNDANT WBC PRESENT, PREDOMINANTLY PMN NO ORGANISMS SEEN Performed at [facility], [address] [place], [place] [address]     Report Status 09/22/2020 FINAL  Final  Culture, blood (routine x 2)     Status: None   Collection Time: 09/24/20  9:55 AM   Specimen: BLOOD LEFT HAND  Result Value Ref Range Status   Specimen Description BLOOD LEFT HAND  Final   Special Requests   Final    BOTTLES DRAWN AEROBIC AND ANAEROBIC Blood Culture adequate volume   Culture   Final    NO GROWTH 5 DAYS Performed at [facility], [address], [place], [place] [address]    Report Status 09/29/2020 FINAL  Final  Culture, blood (routine x 2)     Status: None   Collection Time: 09/24/20 10:05 AM   Specimen: BLOOD RIGHT HAND  Result Value Ref Range Status   Specimen Description BLOOD RIGHT HAND  Final   Special Requests   Final    BOTTLES DRAWN AEROBIC AND ANAEROBIC Blood Culture adequate volume   Culture   Final    NO GROWTH 5 DAYS Performed at [facility], [address], [place], [place] [address]    Report Status 09/29/2020 FINAL  Final    Radiology Studies: No results found.    Maurion Walkowiak T. Arya Boxley [facility]  If 7PM-7AM, please contact night-coverage [email or web address] 09/30/2020, 12:26 PM

## 2020-09-30 NOTE — Consult Note (Signed)
NAME:  [name], MRN:  [id number], DOB:  Jun 30, 1965, LOS: 18 ADMISSION DATE:  09/12/2020, CONSULTATION DATE:  09/28/20 REFERRING MD:  Alanda Slim - [facility], CHIEF COMPLAINT:  Hypoxic respiratory failure    History of Present Illness:  55 yo F PMH ITP, DM2, CKD III,  dCHF, ICH, PVD s/p R BKA,neurogenic bladder, physical deconditioning presented to ED 9/9 with SOB, progressive fatigue. Labs revealed pancytopenia. Admitted to [facility] for ITP / pancytopenia -- treated with transfusion, IVIG, IV Fe, EPO, Nplate, steroids and ultimately underwent splenectomy 9/15.   Post op course has been complicated by CHF exacerbation, sepsis due to HAP, complicated/likely parapneumonic effusion (s/p thora 9/19 with IR -- exudative, gram stain and cx negative), MRSE bacteremia for which pt is receiving Penicillin G through 9/26   On 9/25 PCCM consulted due to progressive SOB and concerns for persistent L sided effusion.  Some emesis earlier today but no frank aspiration or productive cough.  Pertinent  Medical History  CKD III  DM2 dCHF ICH PVD R BKA  Significant Hospital Events: Including procedures, antibiotic start and stop dates in addition to other pertinent events   9/9 admit to [facility] with ITP / pancytopenia. Heme consulted 9/12 CCS consulted  9/15 splenectomy 9/19 thora for L pleural effusion  9/21 ID consult for MRSE bacteremia 9/25 CCM consult for persistent pleural effusion, SOB hypoxia  09/30/2020 negative INO appears to be stable  Interim History / Subjective:   PCCM consulted for SOB, concern for persistent L pleural effusion   Objective   Blood pressure (!) 152/64, pulse 79, temperature 99 F (37.2 C), temperature source Oral, resp. rate (!) 21, height 5\' 7"  (1.702 m), weight 107.5 kg, SpO2 94 %.        Intake/Output Summary (Last 24 hours) at 09/30/2020 0954 Last data filed at 09/30/2020 0418 Gross per 24 hour  Intake 560 ml  Output 1610 ml  Net -1050 ml   Filed Weights   09/27/20 0357  09/29/20 0326 09/30/20 0410  Weight: 109.6 kg 108.8 kg 107.5 kg    Examination: General: Frail appearing 55 year old female who looks older than stated age HEENT: No JVD or lymphadenopathy is appreciated Neuro: Grossly intact without focal CV: Heart sounds are regular PULM: Minich bases left greater than right GI: soft, bsx4 active surgical site unremarkable  Extremities: warm/dry, 1 edema right BKA Skin: no rashes or lesions    Resolved Hospital Problem list     Assessment & Plan:   Acute hypoxic respiratory failure  HAP with dense LLL consolidation and distal endobronchial occlusion Exudative, likely complicated parapneumonic left pleural effusion (s/p thora 9/19)  Anasarca Asplenic patient Effusion is too small to safely place pigtail/thora at bedside.  P  Being diuresed per primary -3 L over 72 Diuresis as tolerated Antibiotics per ID Appears to be improving She will be discharged with pulmonary follow-up due to the long-term course of Augmentin ordered by ID   Intake/Output Summary (Last 24 hours) at 09/30/2020 1001 Last data filed at 09/30/2020 0418 Gross per 24 hour  Intake 560 ml  Output 1610 ml  Net -1050 ml    ITP s/p splenectomy (POD 10)  Per surgery May need hematology follow-up  Acute on chronic diastolic HF -BNP persistently elevated P Continue diuresis  AKI on CKD III  Lab Results  Component Value Date   CREATININE 0.98 09/30/2020   CREATININE 1.21 (H) 09/29/2020   CREATININE 1.31 (H) 09/28/2020   CREATININE 2.63 (H) 06/12/2020   CREATININE 1.42 (  H) 06/06/2020   CREATININE 1.44 (H) 05/21/2020   CREATININE 1.3 (H) 09/30/2015     P monitor   Best Practice (right click and "Reselect all SmartList Selections" daily)   Per [facility]  Labs   CBC: Recent Labs  Lab 09/23/20 2008 09/24/20 0342 09/25/20 0358 09/26/20 0227 09/27/20 0336 09/28/20 0334 09/29/20 0217 09/30/20 0458  WBC 17.9* 17.0* 14.2* 16.8* 15.6* 19.2* 20.3* 17.4*   NEUTROABS 14.9* 12.6* 9.6* 11.8* 10.9*  --   --   --   HGB 8.4* 9.2* 9.0* 8.1* 8.0* 8.0* 8.3* 8.4*  HCT 28.7* 31.9* 29.7* 27.5* 27.7* 28.0* 28.3* 28.7*  MCV 87.2 87.4 85.8 86.5 86.8 86.7 86.3 85.4  PLT 723* 757* 790* 746* 755* 810* 812* 864*    Basic Metabolic Panel: Recent Labs  Lab 09/26/20 0227 09/27/20 0336 09/28/20 0334 09/29/20 0217 09/30/20 0458  NA 136 137 138 139 137  K 3.5 3.5 4.1 3.8 3.8  CL 103 104 105 106 104  CO2 25 26 25 24 25   GLUCOSE 109* 103* 103* 166* 100*  BUN 32* 30* 29* 25* 19  CREATININE 1.47* 1.42* 1.31* 1.21* 0.98  CALCIUM 7.7* 7.9* 8.1* 8.3* 8.5*  MG 1.7  --  1.7 1.5* 1.6*  PHOS  --   --  2.4* 2.2* 2.3*   GFR: Estimated Creatinine Clearance: 83.8 mL/min (by C-G formula based on SCr of 0.98 mg/dL). Recent Labs  Lab 09/23/20 2033 09/23/20 2246 09/24/20 0342 09/27/20 0336 09/28/20 0334 09/29/20 0217 09/30/20 0458  WBC  --   --    < > 15.6* 19.2* 20.3* 17.4*  LATICACIDVEN 1.0 1.1  --   --   --   --   --    < > = values in this interval not displayed.    Liver Function Tests: Recent Labs  Lab 09/23/20 2008 09/24/20 0342 09/25/20 0358 09/28/20 0334 09/29/20 0217 09/30/20 0458  AST 28 24 17   --   --   --   ALT 16 16 13   --   --   --   ALKPHOS 75 83 74  --   --   --   BILITOT 0.5 0.6 0.7  --   --   --   PROT 6.2* 6.4* 6.0*  --   --   --   ALBUMIN 2.0* 2.2* 2.1* 2.1* 2.2* 2.2*   No results for input(s): LIPASE, AMYLASE in the last 168 hours.  No results for input(s): AMMONIA in the last 168 hours.  ABG    Component Value Date/Time   PHART 7.265 (L) 09/23/2020 1928   PCO2ART 46.5 09/23/2020 1928   PO2ART 171 (H) 09/23/2020 1928   HCO3 20.2 09/23/2020 1928   TCO2 31 09/18/2020 1341   ACIDBASEDEF 5.5 (H) 09/23/2020 1928   O2SAT 99.6 09/23/2020 1928     Coagulation Profile: Recent Labs  Lab 09/24/20 1035  INR 1.3*    Cardiac Enzymes: No results for input(s): CKTOTAL, CKMB, CKMBINDEX, TROPONINI in the last 168  hours.  HbA1C: HbA1c, POC (prediabetic range)  Date/Time Value Ref Range Status  08/22/2019 04:39 PM 5.5 (A) 5.7 - 6.4 % Final   HbA1c, POC (controlled diabetic range)  Date/Time Value Ref Range Status  08/22/2019 04:39 PM 5.5 0.0 - 7.0 % Final   HbA1c POC (<> result, manual entry)  Date/Time Value Ref Range Status  08/22/2019 04:39 PM 5.5 4.0 - 5.6 % Final   Hgb A1c MFr Bld  Date/Time Value Ref Range Status  09/28/2020  03:34 AM 5.5 4.8 - 5.6 % Final    Comment:    (NOTE) Pre diabetes:          5.7%-6.4%  Diabetes:              >6.4%  Glycemic control for   <7.0% adults with diabetes   06/13/2020 05:00 AM 6.2 (H) 4.8 - 5.6 % Final    Comment:    (NOTE)         Prediabetes: 5.7 - 6.4         Diabetes: >6.4         Glycemic control for adults with diabetes: <7.0     CBG: Recent Labs  Lab 09/29/20 1638 09/29/20 2007 09/29/20 2334 09/30/20 0409 09/30/20 0756  GLUCAP 151* 148* 116* 106* 104*         Steve Kamarii Carton ACNP Acute Care Nurse Practitioner [facility] Please consult Amion 09/30/2020, 9:57 AM

## 2020-10-01 DIAGNOSIS — D509 Iron deficiency anemia, unspecified: Secondary | ICD-10-CM | POA: Diagnosis not present

## 2020-10-01 DIAGNOSIS — D649 Anemia, unspecified: Secondary | ICD-10-CM | POA: Diagnosis not present

## 2020-10-01 DIAGNOSIS — D693 Immune thrombocytopenic purpura: Secondary | ICD-10-CM | POA: Diagnosis not present

## 2020-10-01 DIAGNOSIS — R7881 Bacteremia: Secondary | ICD-10-CM | POA: Diagnosis not present

## 2020-10-01 LAB — RENAL FUNCTION PANEL
Albumin: 2.2 g/dL — ABNORMAL LOW (ref 3.5–5.0)
Anion gap: 6 (ref 5–15)
BUN: 18 mg/dL (ref 6–20)
CO2: 27 mmol/L (ref 22–32)
Calcium: 8.3 mg/dL — ABNORMAL LOW (ref 8.9–10.3)
Chloride: 103 mmol/L (ref 98–111)
Creatinine, Ser: 1 mg/dL (ref 0.44–1.00)
GFR, Estimated: >60 mL/min (ref >60)
Glucose, Bld: 92 mg/dL (ref 70–99)
Phosphorus: 2.9 mg/dL (ref 2.5–4.6)
Potassium: 3.8 mmol/L (ref 3.5–5.1)
Sodium: 136 mmol/L (ref 135–145)

## 2020-10-01 LAB — GLUCOSE, CAPILLARY
Glucose-Capillary: 126 mg/dL — ABNORMAL HIGH (ref 70–99)
Glucose-Capillary: 109 mg/dL — ABNORMAL HIGH (ref 70–99)
Glucose-Capillary: 110 mg/dL — ABNORMAL HIGH (ref 70–99)
Glucose-Capillary: 151 mg/dL — ABNORMAL HIGH (ref 70–99)
Glucose-Capillary: 153 mg/dL — ABNORMAL HIGH (ref 70–99)
Glucose-Capillary: 151 mg/dL — ABNORMAL HIGH (ref 70–99)

## 2020-10-01 LAB — CBC
HCT: 27.7 % — ABNORMAL LOW (ref 36.0–46.0)
Hemoglobin: 8 g/dL — ABNORMAL LOW (ref 12.0–15.0)
MCH: 24.6 pg — ABNORMAL LOW (ref 26.0–34.0)
MCHC: 28.9 g/dL — ABNORMAL LOW (ref 30.0–36.0)
MCV: 85.2 fL (ref 80.0–100.0)
Platelets: 870 10*3/uL — ABNORMAL HIGH (ref 150–400)
RBC: 3.25 MIL/uL — ABNORMAL LOW (ref 3.87–5.11)
RDW: 24.7 % — ABNORMAL HIGH (ref 11.5–15.5)
WBC: 13.9 10*3/uL — ABNORMAL HIGH (ref 4.0–10.5)
nRBC: 0.1 % (ref 0.0–0.2)

## 2020-10-01 LAB — MAGNESIUM: Magnesium: 1.9 mg/dL (ref 1.7–2.4)

## 2020-10-01 LAB — BRAIN NATRIURETIC PEPTIDE: B Natriuretic Peptide: 619.5 pg/mL — ABNORMAL HIGH (ref 0.0–100.0)

## 2020-10-01 NOTE — Progress Notes (Signed)
Occupational Therapy Treatment Patient Details Name: [name] MRN: [id number] DOB: 1965-03-09 Today's Date: 10/01/2020   History of present illness Pt is a 55 y.o. female admitted 09/12/20 with SOB, fatigue, bruising. Workup for symptomatic anemia, idiopathic thrombocytopenic purpura. 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 CXR 9/18 with concern for bilateral PNA, large L pleural effusion. S/p thoracentesis 9/19. PMH includes R BKA (2013), CHF, DM2, HTN, stroke, ICH, arthritis, neuropathy, chronic indwelling catheter.   OT comments  Treatment focused on functional mobility as needed to advance ADLs to wc level. Patient supervision to transfer to side of bed and demonstrated good balance. Worked on partial sit to stand (lifting butt off bed) using back of recliner to pull up on. Patient performed x 3 with verbal cues - patient able to power up with pulling but only a couple of inches. Attempted to perform with chair holding on to chair arms to promote anterior lean needed for transition into standing but patient uncomfortable. Patient performed scooting at side of bed to work on upper body strength, activity tolerance. Increased time to perform actual squat pivot to recliner - patient needing repeated instruction - with multiple attempts. Second person needed to stabilize recliner. Patient making progress - continue POC. Continue to recommend aggressive short term rehab at discharge.   Recommendations for follow up therapy are one component of a multi-disciplinary discharge planning process, led by the attending physician.  Recommendations may be updated based on patient status, additional functional criteria and insurance authorization.    Follow Up Recommendations  CIR    Equipment Recommendations  3 in 1 bedside commode;Tub/shower bench    Recommendations for Other Services      Precautions / Restrictions Precautions Precautions: Fall;Other (comment) Precaution Comments: L abdominal JP  drain; R BKA (unable to don prosthesis due to swelling) Restrictions Weight Bearing Restrictions: No       Mobility Bed Mobility Overal bed mobility: Needs Assistance Bed Mobility: Supine to Sit     Supine to sit: Supervision;HOB elevated     General bed mobility comments: supervision to transfer to side of bed    Transfers Overall transfer level: Needs assistance Equipment used: None Transfers: Lateral/Scoot Transfers     Squat pivot transfers: Mod assist     General transfer comment: Performed scooting at side of bed to work on overall strength and endurance - to the left and right. Worked on partial stand as needed for squat pivot. WIth transfer to recliner mod assist to pivot - second person stabilizing recliner.    Balance Overall balance assessment: Needs assistance Sitting-balance support: No upper extremity supported Sitting balance-Leahy Scale: Good                                     ADL either performed or assessed with clinical judgement   ADL                                               Vision Patient Visual Report: No change from baseline     Perception     Praxis      Cognition Arousal/Alertness: Awake/alert Behavior During Therapy: WFL for tasks assessed/performed Overall Cognitive Status: Within Functional Limits for tasks assessed  Exercises     Shoulder Instructions       General Comments      Pertinent Vitals/ Pain       Pain Assessment: No/denies pain  Home Living                                          Prior Functioning/Environment              Frequency  Min 2X/week        Progress Toward Goals  OT Goals(current goals can now be found in the care plan section)  Progress towards OT goals: Progressing toward goals  Acute Rehab OT Goals Patient Stated Goal: to go to CIR to get better and go home OT  Goal Formulation: With patient Time For Goal Achievement: 10/03/20 Potential to Achieve Goals: Good  Plan Discharge plan remains appropriate    Co-evaluation                 AM-PAC OT "6 Clicks" Daily Activity     Outcome Measure   Help from another person eating meals?: None Help from another person taking care of personal grooming?: A Little Help from another person toileting, which includes using toliet, bedpan, or urinal?: A Lot Help from another person bathing (including washing, rinsing, drying)?: A Lot Help from another person to put on and taking off regular upper body clothing?: A Little Help from another person to put on and taking off regular lower body clothing?: A Lot 6 Click Score: 16    End of Session    OT Visit Diagnosis: Unsteadiness on feet (R26.81);Muscle weakness (generalized) (M62.81);Other symptoms and signs involving cognitive function   Activity Tolerance Patient tolerated treatment well   Patient Left in chair;with call bell/phone within reach;with chair alarm set;with nursing/sitter in room   Nurse Communication Mobility status        Time: 1700-1749 OT Time Calculation (min): 28 min  Charges: OT General Charges $OT Visit: 1 Visit OT Treatments $Therapeutic Activity: 23-37 mins  Abdiel Blackerby, OTR/L Acute Care Rehab Services  Office [phone] Pager: [phone]   Kelli Churn 10/01/2020, 10:07 AM

## 2020-10-01 NOTE — Plan of Care (Signed)
  Problem: Clinical Measurements: Goal: Ability to maintain clinical measurements within normal limits will improve Outcome: Progressing Goal: Will remain free from infection Outcome: Progressing Goal: Diagnostic test results will improve Outcome: Progressing Goal: Respiratory complications will improve Outcome: Progressing Goal: Cardiovascular complication will be avoided Outcome: Progressing   Problem: Activity: Goal: Risk for activity intolerance will decrease Outcome: Progressing   Problem: Nutrition: Goal: Adequate nutrition will be maintained Outcome: Progressing   Problem: Coping: Goal: Level of anxiety will decrease Outcome: Progressing   Problem: Coping: Goal: Level of anxiety will decrease Outcome: Progressing   Problem: Elimination: Goal: Will not experience complications related to bowel motility Outcome: Progressing

## 2020-10-01 NOTE — Progress Notes (Signed)
Inpatient Rehabilitation Admissions Coordinator   CIR bed is not available to admit her today. Acute team made aware.  Ottie Glazier, RN, MSN Rehab Admissions Coordinator [phone] 10/01/2020 11:55 AM

## 2020-10-01 NOTE — Progress Notes (Signed)
PROGRESS NOTE  [name] HWE:[id number] DOB: 1965-07-23   PCP: Annita Brod, MD  Patient is from: Home.  DOA: 09/12/2020 LOS: 19  Chief complaints:  Chief Complaint  Patient presents with   Leg Swelling     Brief Narrative / Interim history: 55 year old F with PMH of ITP, DM-2, CKD-3, diastolic CHF, ICH, PVD, right BKA, neurogenic bladder/chronic Foley presenting with shortness of breath, fatigue, physical deconditioning and bruising for about a month, and admitted for ITP/pancytopenia with Hgb of 4.5, platelet <5 and WBC 2.8, and acute on chronic diastolic CHF.  Heme-onc consulted and she was treated with blood transfusion, IVIG, EPO, IV iron, Nplate, steroids and splenectomy with improvement in her pancytopenia.  She was also diuresed with IV Lasix for CHF exacerbation.  Hospital course noteworthy of sepsis/acute hypoxic RF in the setting of HAP/exudative loculated left pleural effusion/MRSE bacteremia.  Completed 7 days of IV Vanc and Cefepime and started  3 weeks of Augmentin which will 10/17.  Respiratory failure resolved.  Liberated off oxygen.  Stable to discharge to CIR once bed available.  PCCM and general surgery to arrange outpatient follow-up.  Subjective: Seen and examined earlier this morning.  No major events overnight of this morning.  Sitting on bedside chair.  No longer on oxygen.  No complaints.  She denies shortness of breath, chest pain, GI or UTI symptoms.  Objective: Vitals:   09/30/20 0753 09/30/20 1339 09/30/20 1957 10/01/20 0400  BP: (!) 152/64 (!) 146/64 (!) 145/63 (!) 157/64  Pulse: 79 75 74 75  Resp: (!) 21 20 20 20   Temp: 99 F (37.2 C)  98.5 F (36.9 C) 98.3 F (36.8 C)  TempSrc: Oral  Oral Oral  SpO2: 94% 96% 98% 95%  Weight:    107.6 kg  Height:        Intake/Output Summary (Last 24 hours) at 10/01/2020 1453 Last data filed at 10/01/2020 0815 Gross per 24 hour  Intake 480 ml  Output 605 ml  Net -125 ml   Filed Weights   09/29/20  0326 09/30/20 0410 10/01/20 0400  Weight: 108.8 kg 107.5 kg 107.6 kg    Examination:  GENERAL: No apparent distress.  Nontoxic. HEENT: MMM.  Vision and hearing grossly intact.  NECK: Supple.  No apparent JVD.  RESP: 94 to 96% on RA.  No IWOB.  Improved LLL aeration.  About 1000 cc on IS. CVS:  RRR. Heart sounds normal.  ABD/GI/GU: BS+. Abd soft, NTND.  JP drain without significant output. MSK/EXT:  Moves extremities.  Right BKA.  Trace LLE edema. SKIN: no apparent skin lesion or wound NEURO: Awake and alert. Oriented appropriately.  No apparent focal neuro deficit. PSYCH: Calm. Normal affect.     Procedures:  9/15-splenectomy  Microbiology summarized: 9/9-COVID-19 and influenza PCR nonreactive. 9/18-blood culture with MRSE 9/21-repeat blood cultures NGTD.  Assessment & Plan: ITP/pancytopenia/symptomatic anemia-resolved except for anemia.  Hgb stable at 8.0. -Hgb of 4.5, platelet <5 and WBC 2.8 -S/p  IVIG, Nplate, steroids, 1 unit of PRBC and splenectomy with appropriate vaccines on 9/15 -General surgery following-JP drain in place.  Waiting on fluid amylase levels to exclude pancreatic leak.  -Hematology to arrange outpatient follow-up.   Sepsis due to HAP/large exudative left pleural effusion/MRSE bacteremia-sepsis physiology resolved except for leukocytosis which is improving. -Zosyn>> cefepime, Flagyl and vancomycin> 9/26> p.o. Augmentin >>> 10/17 -See below for pleural effusion.  Exudative left pleural effusion-s/p left-Thora on 9/19 with removal of 500 cc.  Cultures negative.  Subsequent CT  on 9/21 with partially loculated left pleural effusion with associated areas of atelectasis in the lungs.  Now with improved aeration and LLL.  Hypoxemia resolved.   -Antibiotics as above.  -9/25-CVTS, Dr. Cliffton Asters recommended pulmonary consult -Appreciate help by pulm-see their recommendations. -Pulmonology to arrange outpatient follow-up  Acute respiratory failure with  hypoxia-multifactorial as above.  Carries history of chronic hypoxic RF but she says she never used oxygen.  Respiratory failure resolved. -Encourage IS/OOB/PT/OT -Antibiotics as above -Appreciate help by pulm  Acute on chronic diastolic CHF: reportedly out of her Lasix for 1 month POA.  It appears that she was taking 160 mg of Lasix daily at home.  On p.o. Lasix 40 mg twice daily here.  About 1.3 L UOP/24 hours.  Net 13.5 L so far..  Weight down 10 pounds.  Creatinine and BNP improved.  Still with trace edema. -Continue Lasix 40 mg twice daily-stable on this. -Monitor fluid status, renal functions and electrolytes  Cecal wall thickening: Noted on CT abdomen and pelvis.  GI consulted.   -Colonoscopy once she is more stable.  GI to arrange, likely outpatient  Essential hypertension: SBP in 150s. -Continue Toprol-XL, Imdur and Lasix -Continue holding amlodipine   AKI on CKD IIIB/obstructive uropathy-resolved. Recent Labs    09/23/20 0939 09/23/20 2008 09/24/20 0342 09/25/20 0358 09/26/20 0227 09/27/20 0336 09/28/20 0334 09/29/20 0217 09/30/20 0458 10/01/20 0344  BUN 45* 44* 41* 36* 32* 30* 29* 25* 19 18  CREATININE 1.99* 2.14* 2.06* 1.71* 1.47* 1.42* 1.31* 1.21* 0.98 1.00  -Continue monitoring   Controlled DM-2 with hyperglycemia: A1c 5.5% but after PRBC.  Was 6.2% in 06/2020. Recent Labs  Lab 09/30/20 1958 10/01/20 0006 10/01/20 0400 10/01/20 0653 10/01/20 1137  GLUCAP 140* 126* 110* 109* 151*  -Continue SSI-moderate.   Debility/physical deconditioning/right BKA: Has a wheelchair at home. -Therapy recommended CIR   Hyponatremia: Resolved.  Hypomagnesemia: Resolved.  Leukocytosis/thrombocytosis: Leukocytosis started to improve.  Thrombocytosis remains elevated -Management as above.   Left thyroid nodule: 5.7 cm.  TSH 6.8.  -May need FNA outpatient.  Morbid obesity-somewhat underestimated in the setting of BKA Body mass index is 37.15 kg/m.         DVT  prophylaxis:  SCDs Start: 09/12/20 1951    On subcu Lovenox  Code Status: Full code Family Communication: Patient and/or RN. Available if any question.  Level of care: Med-Surg Status is: Inpatient  Remains inpatient appropriate because:Unsafe d/c plan  Dispo: The patient is from: Home              Anticipated d/c is to: CIR              Patient currently is medically stable to d/c.   Difficult to place patient No       Consultants:  General surgery Infectious disease Hematology/oncology CVTS over the phone Pulmonology   Sch Meds:  Scheduled Meds:  amoxicillin-clavulanate  1 tablet Oral Q12H   bethanechol  10 mg Oral TID   enoxaparin (LOVENOX) injection  50 mg Subcutaneous Q24H   folic acid  2 mg Oral Daily   furosemide  40 mg Oral BID   gabapentin  400 mg Oral BID   insulin aspart  0-15 Units Subcutaneous Q4H   isosorbide mononitrate  30 mg Oral Daily   metoprolol succinate  25 mg Oral Daily   pantoprazole  40 mg Oral Daily   rosuvastatin  20 mg Oral Daily   Continuous Infusions:   PRN Meds:.acetaminophen, fentaNYL (SUBLIMAZE) injection,  HYDROmorphone (DILAUDID) injection, loperamide, ondansetron (ZOFRAN) IV, oxyCODONE, polyvinyl alcohol  Antimicrobials: Anti-infectives (From admission, onward)    Start     Dose/Rate Route Frequency Ordered Stop   09/29/20 1800  amoxicillin-clavulanate (AUGMENTIN) 875-125 MG per tablet 1 tablet        1 tablet Oral Every 12 hours 09/29/20 0728 10/20/20 2159   09/27/20 1045  vancomycin (VANCOREADY) IVPB 1750 mg/350 mL  Status:  Discontinued        1,750 mg 175 mL/hr over 120 Minutes Intravenous Every 24 hours 09/27/20 1040 09/27/20 1043   09/27/20 1045  vancomycin (VANCOREADY) IVPB 1250 mg/250 mL        1,250 mg 166.7 mL/hr over 90 Minutes Intravenous Every 24 hours 09/27/20 1043 09/29/20 2031   09/25/20 1200  vancomycin (VANCOREADY) IVPB 1750 mg/350 mL  Status:  Discontinued        1,750 mg 175 mL/hr over 120 Minutes  Intravenous Every 48 hours 09/23/20 1056 09/27/20 1040   09/23/20 1400  ceFEPIme (MAXIPIME) 2 g in sodium chloride 0.9 % 100 mL IVPB        2 g 200 mL/hr over 30 Minutes Intravenous Every 12 hours 09/23/20 1047 09/29/20 1431   09/23/20 1400  metroNIDAZOLE (FLAGYL) tablet 500 mg  Status:  Discontinued        500 mg Oral Every 12 hours 09/23/20 1047 09/24/20 1028   09/23/20 1200  vancomycin (VANCOREADY) IVPB 2000 mg/400 mL        2,000 mg 200 mL/hr over 120 Minutes Intravenous  Once 09/23/20 1047 09/23/20 1326   09/21/20 1400  piperacillin-tazobactam (ZOSYN) IVPB 3.375 g  Status:  Discontinued        3.375 g 12.5 mL/hr over 240 Minutes Intravenous Every 8 hours 09/21/20 1122 09/23/20 1047   09/18/20 1115  cefoTEtan (CEFOTAN) 2 g in sodium chloride 0.9 % 100 mL IVPB        2 g 200 mL/hr over 30 Minutes Intravenous  Once 09/18/20 1112 09/18/20 1250   09/18/20 1114  sodium chloride 0.9 % with cefoTEtan (CEFOTAN) ADS Med       Note to Pharmacy: Janene Harvey   : cabinet override      09/18/20 1114 09/18/20 1253        I have personally reviewed the following labs and images: CBC: Recent Labs  Lab 09/25/20 0358 09/26/20 0227 09/27/20 0336 09/28/20 0334 09/29/20 0217 09/30/20 0458 10/01/20 0344  WBC 14.2* 16.8* 15.6* 19.2* 20.3* 17.4* 13.9*  NEUTROABS 9.6* 11.8* 10.9*  --   --   --   --   HGB 9.0* 8.1* 8.0* 8.0* 8.3* 8.4* 8.0*  HCT 29.7* 27.5* 27.7* 28.0* 28.3* 28.7* 27.7*  MCV 85.8 86.5 86.8 86.7 86.3 85.4 85.2  PLT 790* 746* 755* 810* 812* 864* 870*   BMP &GFR Recent Labs  Lab 09/26/20 0227 09/27/20 0336 09/28/20 0334 09/29/20 0217 09/30/20 0458 10/01/20 0344  NA 136 137 138 139 137 136  K 3.5 3.5 4.1 3.8 3.8 3.8  CL 103 104 105 106 104 103  CO2 25 26 25 24 25 27   GLUCOSE 109* 103* 103* 166* 100* 92  BUN 32* 30* 29* 25* 19 18  CREATININE 1.47* 1.42* 1.31* 1.21* 0.98 1.00  CALCIUM 7.7* 7.9* 8.1* 8.3* 8.5* 8.3*  MG 1.7  --  1.7 1.5* 1.6* 1.9  PHOS  --   --  2.4*  2.2* 2.3* 2.9   Estimated Creatinine Clearance: 82.2 mL/min (by C-G formula based on SCr of  1 mg/dL). Liver & Pancreas: Recent Labs  Lab 09/25/20 0358 09/28/20 0334 09/29/20 0217 09/30/20 0458 10/01/20 0344  AST 17  --   --   --   --   ALT 13  --   --   --   --   ALKPHOS 74  --   --   --   --   BILITOT 0.7  --   --   --   --   PROT 6.0*  --   --   --   --   ALBUMIN 2.1* 2.1* 2.2* 2.2* 2.2*   No results for input(s): LIPASE, AMYLASE in the last 168 hours.  No results for input(s): AMMONIA in the last 168 hours. Diabetic: No results for input(s): HGBA1C in the last 72 hours.  Recent Labs  Lab 09/30/20 1958 10/01/20 0006 10/01/20 0400 10/01/20 0653 10/01/20 1137  GLUCAP 140* 126* 110* 109* 151*   Cardiac Enzymes: No results for input(s): CKTOTAL, CKMB, CKMBINDEX, TROPONINI in the last 168 hours. No results for input(s): PROBNP in the last 8760 hours. Coagulation Profile: No results for input(s): INR, PROTIME in the last 168 hours.  Thyroid Function Tests: No results for input(s): TSH, T4TOTAL, FREET4, T3FREE, THYROIDAB in the last 72 hours.  Lipid Profile: No results for input(s): CHOL, HDL, LDLCALC, TRIG, CHOLHDL, LDLDIRECT in the last 72 hours. Anemia Panel: No results for input(s): VITAMINB12, FOLATE, FERRITIN, TIBC, IRON, RETICCTPCT in the last 72 hours.  Urine analysis:    Component Value Date/Time   COLORURINE YELLOW 09/22/2020 1212   APPEARANCEUR CLEAR 09/22/2020 1212   LABSPEC <1.005 (L) 09/22/2020 1212   PHURINE 5.5 09/22/2020 1212   GLUCOSEU NEGATIVE 09/22/2020 1212   HGBUR NEGATIVE 09/22/2020 1212   BILIRUBINUR NEGATIVE 09/22/2020 1212   KETONESUR NEGATIVE 09/22/2020 1212   PROTEINUR NEGATIVE 09/22/2020 1212   UROBILINOGEN 2.0 (H) 07/08/2011 0051   NITRITE NEGATIVE 09/22/2020 1212   LEUKOCYTESUR SMALL (A) 09/22/2020 1212   Sepsis Labs: Invalid input(s): PROCALCITONIN, LACTICIDVEN  Microbiology: Recent Results (from the past 240 hour(s))   Culture, body fluid w Gram Stain-bottle     Status: None   Collection Time: 09/22/20  4:51 PM   Specimen: Pleura  Result Value Ref Range Status   Specimen Description PLEURAL  Final   Special Requests NONE  Final   Culture   Final    NO GROWTH 7 DAYS Performed at [facility], [address] [place], [place] [address]    Report Status 09/29/2020 FINAL  Final  Gram stain     Status: None   Collection Time: 09/22/20  4:51 PM   Specimen: Pleura  Result Value Ref Range Status   Specimen Description PLEURAL  Final   Special Requests NONE  Final   Gram Stain   Final    ABUNDANT WBC PRESENT, PREDOMINANTLY PMN NO ORGANISMS SEEN Performed at [facility], [address], [place], [place] [address]    Report Status 09/22/2020 FINAL  Final  Culture, blood (routine x 2)     Status: None   Collection Time: 09/24/20  9:55 AM   Specimen: BLOOD LEFT HAND  Result Value Ref Range Status   Specimen Description BLOOD LEFT HAND  Final   Special Requests   Final    BOTTLES DRAWN AEROBIC AND ANAEROBIC Blood Culture adequate volume   Culture   Final    NO GROWTH 5 DAYS Performed at [facility], [address], [place], [place] [address]  Report Status 09/29/2020 FINAL  Final  Culture, blood (routine x 2)     Status: None   Collection Time: 09/24/20 10:05 AM   Specimen: BLOOD RIGHT HAND  Result Value Ref Range Status   Specimen Description BLOOD RIGHT HAND  Final   Special Requests   Final    BOTTLES DRAWN AEROBIC AND ANAEROBIC Blood Culture adequate volume   Culture   Final    NO GROWTH 5 DAYS Performed at [facility], [address] [place], [place] [address]    Report Status 09/29/2020 FINAL  Final    Radiology Studies: No results found.    Shahla Betsill T. Joye Wesenberg [facility]  If 7PM-7AM, please contact night-coverage [email or web address] 10/01/2020, 2:53 PM

## 2020-10-01 NOTE — TOC Progression Note (Signed)
Transition of Care [facility]) - Progression Note    Patient Details  Name: [name] MRN: [id number] Date of Birth: 12-02-1965  [facility] of [facility]) CM/SW Contact  Leone Haven, RN Phone Number: 10/01/2020, 4:07 PM  Clinical Narrative:    Per Britta Mccreedy with CIR she does have auth and has had it for two days, she expects to have a bed for patient this week.  Since she has Berkley Harvey will wait for bed to open.        Expected Discharge Plan and Services                                                 Social Determinants of Health (SDOH) Interventions Food Insecurity Interventions: Intervention Not Indicated Financial Strain Interventions: Intervention Not Indicated Housing Interventions: Intervention Not Indicated Transportation Interventions: [facility]  Readmission Risk Interventions Readmission Risk Prevention Plan 03/11/2020 04/13/2019 11/13/2018  Transportation Screening Complete Complete Complete  PCP or Specialist Appt within 5-7 Days - - Complete  PCP or Specialist Appt within 3-5 Days Complete Complete -  Home Care Screening - - Complete  Medication Review (RN CM) - - Complete  HRI or Home Care Consult Complete Complete -  Social Work Consult for Recovery Care Planning/Counseling Complete Complete -  Palliative Care Screening Not Applicable Not Applicable -  Medication Review Oceanographer) Complete Complete -  Some recent data might be hidden

## 2020-10-01 NOTE — Plan of Care (Signed)
  Problem: Clinical Measurements: Goal: Respiratory complications will improve Outcome: Progressing   Problem: Coping: Goal: Level of anxiety will decrease Outcome: Progressing   

## 2020-10-01 NOTE — Progress Notes (Signed)
13 Days Post-Op   Chief Complaint/Subjective: Pain controlled, tolerating diet  Review of Systems See above, otherwise other systems negative   PMH -  has a past medical history of Acute exacerbation of CHF (congestive heart failure) (HCC) (04/11/2019), Anemia, Arthritis, Back pain, Chronic indwelling Foley catheter (03/04/2017), Diabetic foot ulcer associated with type 2 diabetes mellitus (HCC), Hypertension, Intracerebral hemorrhage (HCC) (As a teenager ), Neuropathy, Stroke (HCC) (~ 1982), and Type II diabetes mellitus (HCC). PSH -  has a past surgical history that includes Cesarean section (2004; 2006); Amputation (11/24/2010); I & D extremity (11/24/2010); Amputation (07/02/2011); Application if wound vac (07/02/2011); I & D extremity (07/02/2011); I & D extremity (07/06/2011); Brain surgery (1980's); Esophagogastroduodenoscopy (N/A, 09/17/2015); Colonoscopy (N/A, 09/17/2015); Tubal ligation (2006); laparoscopic splenectomy (N/A, 09/18/2020); and IR THORACENTESIS ASP PLEURAL SPACE W/IMG GUIDE (09/22/2020).  [facility] - family history includes Cancer in her father; Diabetes in her brother, father, mother, and son; Hyperlipidemia in her father and mother; Hypertension in her father and mother; Peripheral Artery Disease in her brother.   Objective: Vital signs in last 24 hours: Temp:  [98.3 F (36.8 C)-98.5 F (36.9 C)] 98.3 F (36.8 C) (09/28 0400) Pulse Rate:  [74-75] 75 (09/28 0400) Resp:  [20] 20 (09/28 0400) BP: (145-157)/(63-64) 157/64 (09/28 0400) SpO2:  [95 %-98 %] 95 % (09/28 0400) Weight:  [107.6 kg] 107.6 kg (09/28 0400) Last BM Date: 09/29/20 Intake/Output from previous day: 09/27 0701 - 09/28 0700 In: 770 [P.O.:720; IV Piggyback:50] Out: 1300 [Urine:1300] Intake/Output this shift: No intake/output data recorded.  PE: Gen: NAd Resp: nonlabored Card: RRR Abd: soft, drain with scant thin fluid  Lab Results:  Recent Labs    09/30/20 0458 10/01/20 0344  WBC 17.4* 13.9*  HGB  8.4* 8.0*  HCT 28.7* 27.7*  PLT 864* 870*   BMET Recent Labs    09/30/20 0458 10/01/20 0344  NA 137 136  K 3.8 3.8  CL 104 103  CO2 25 27  GLUCOSE 100* 92  BUN 19 18  CREATININE 0.98 1.00  CALCIUM 8.5* 8.3*   PT/INR No results for input(s): LABPROT, INR in the last 72 hours. CMP     Component Value Date/Time   NA 136 10/01/2020 0344   NA 139 05/16/2019 1522   NA 138 09/30/2015 1118   K 3.8 10/01/2020 0344   K 4.6 09/30/2015 1118   CL 103 10/01/2020 0344   CO2 27 10/01/2020 0344   CO2 19 (L) 09/30/2015 1118   GLUCOSE 92 10/01/2020 0344   GLUCOSE 200 (H) 09/30/2015 1118   BUN 18 10/01/2020 0344   BUN 23 05/16/2019 1522   BUN 22.3 09/30/2015 1118   CREATININE 1.00 10/01/2020 0344   CREATININE 2.63 (H) 06/12/2020 0755   CREATININE 1.3 (H) 09/30/2015 1118   CALCIUM 8.3 (L) 10/01/2020 0344   CALCIUM 9.6 09/30/2015 1118   PROT 6.0 (L) 09/25/2020 0358   PROT 8.2 05/16/2019 1522   ALBUMIN 2.2 (L) 10/01/2020 0344   ALBUMIN 4.2 05/16/2019 1522   AST 17 09/25/2020 0358   AST 15 06/12/2020 0755   ALT 13 09/25/2020 0358   ALT 9 06/12/2020 0755   ALKPHOS 74 09/25/2020 0358   BILITOT 0.7 09/25/2020 0358   BILITOT 0.3 06/12/2020 0755   GFRNONAA >60 10/01/2020 0344   GFRNONAA 21 (L) 06/12/2020 0755   GFRAA 59 (L) 09/19/2019 1449   Lipase     Component Value Date/Time   LIPASE 23 06/26/2019 0124    Studies/Results: No results found.  Anti-infectives: Anti-infectives (From admission, onward)    Start     Dose/Rate Route Frequency Ordered Stop   09/29/20 1800  amoxicillin-clavulanate (AUGMENTIN) 875-125 MG per tablet 1 tablet        1 tablet Oral Every 12 hours 09/29/20 0728 10/20/20 2159   09/27/20 1045  vancomycin (VANCOREADY) IVPB 1750 mg/350 mL  Status:  Discontinued        1,750 mg 175 mL/hr over 120 Minutes Intravenous Every 24 hours 09/27/20 1040 09/27/20 1043   09/27/20 1045  vancomycin (VANCOREADY) IVPB 1250 mg/250 mL        1,250 mg 166.7 mL/hr over 90  Minutes Intravenous Every 24 hours 09/27/20 1043 09/29/20 2031   09/25/20 1200  vancomycin (VANCOREADY) IVPB 1750 mg/350 mL  Status:  Discontinued        1,750 mg 175 mL/hr over 120 Minutes Intravenous Every 48 hours 09/23/20 1056 09/27/20 1040   09/23/20 1400  ceFEPIme (MAXIPIME) 2 g in sodium chloride 0.9 % 100 mL IVPB        2 g 200 mL/hr over 30 Minutes Intravenous Every 12 hours 09/23/20 1047 09/29/20 1431   09/23/20 1400  metroNIDAZOLE (FLAGYL) tablet 500 mg  Status:  Discontinued        500 mg Oral Every 12 hours 09/23/20 1047 09/24/20 1028   09/23/20 1200  vancomycin (VANCOREADY) IVPB 2000 mg/400 mL        2,000 mg 200 mL/hr over 120 Minutes Intravenous  Once 09/23/20 1047 09/23/20 1326   09/21/20 1400  piperacillin-tazobactam (ZOSYN) IVPB 3.375 g  Status:  Discontinued        3.375 g 12.5 mL/hr over 240 Minutes Intravenous Every 8 hours 09/21/20 1122 09/23/20 1047   09/18/20 1115  cefoTEtan (CEFOTAN) 2 g in sodium chloride 0.9 % 100 mL IVPB        2 g 200 mL/hr over 30 Minutes Intravenous  Once 09/18/20 1112 09/18/20 1250   09/18/20 1114  sodium chloride 0.9 % with cefoTEtan (CEFOTAN) ADS Med       Note to Pharmacy: Janene Harvey   : cabinet override      09/18/20 1114 09/18/20 1253       Assessment/Plan POD#12 ITP/Thrombocytopenia s/p Hand-assisted laparoscopic splenectomy 6/15 Dr. Donell Beers - Received vaccines pre-op - WBC 17 from 20 today. CT 9/20 reassuring and without organized abscess. Other etiologies of leukocytosis could be reactive s/p splenectomy, or pulmonary as patient has been treated for HAP and loculated L pleural effusion as well. Repeat blood Cx 9/21 NGTD so bacteremia seems resolved. Abdominal exam overall benign with mild, appropriate tenderness. Will discuss timing of repeat abdominal CT with MD.  - Awaiting drain amylase 9/25 to r/o pancreatic leak - Cont JP drain - Suspect small hematoma adjacent to upper incision. No evidence of cellulitis  - Mobilize.  Therapies following. CIR following.    FEN - CM diet VTE - SCD, Lovenox  ID - Vanc/Cefepime until 9/26 per ID, then PO augmentin   - Per TRH -  Staph epi bacteremia - ID following. Repeat blood cx's without growth HAP Loculated left pleural effusion s/p thora 9/19. Cx neg. Loculated pleural effusion on CT 9/21 Cecal wall thickening - noted on CT, GI following and determining timing of colonoscopy  Thyroid nodule - 5.7 cm.  TSH 6.8. May need FNA outpatient ABL anemia - hgb stable at 8.4 this AM WC bound due to RLE BKA DM CHF HTN H/O CVA AKI - Improving   LOS: 19 days  [facility] 10/01/2020, 10:13 AM Please see Amion for pager number during day hours 7:00am-4:30pm or 7:00am -11:30am on weekends

## 2020-10-01 NOTE — Progress Notes (Signed)
Physical Therapy Treatment Patient Details Name: [name] MRN: [id number] DOB: Feb 09, 1965 Today's Date: 10/01/2020   History of Present Illness Pt is a 55 y.o. female admitted 09/12/20 with SOB, fatigue, bruising. Workup for symptomatic anemia, idiopathic thrombocytopenic purpura. 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 CXR 9/18 with concern for bilateral PNA, large L pleural effusion. S/p thoracentesis 9/19. PMH includes R BKA (2013), CHF, DM2, HTN, stroke, ICH, arthritis, neuropathy, chronic indwelling catheter.    PT Comments    Focused session on UE and lower extremity exercises and practicing initiation of buttocks clearance for sit <> stand transfers with pt pulling up on posterior aspect of recliner. Pt displayed improved clearance of buttocks with subsequent reps, but was unable to come to full stand with only modA today. Pt remains very motivated to participate and improve. Will continue to follow acutely. Current recommendations remain appropriate.   Recommendations for follow up therapy are one component of a multi-disciplinary discharge planning process, led by the attending physician.  Recommendations may be updated based on patient status, additional functional criteria and insurance authorization.  Follow Up Recommendations  [facility]     Equipment Recommendations  Other (comment) (lift equipment)    Recommendations for Other Services       Precautions / Restrictions Precautions Precautions: Fall;Other (comment) Precaution Comments: L abdominal JP drain; R BKA (unable to don prosthesis due to swelling) Restrictions Weight Bearing Restrictions: No     Mobility  Bed Mobility Overal bed mobility: Needs Assistance Bed Mobility: Supine to Sit;Sit to Supine     Supine to sit: Supervision;HOB elevated Sit to supine: Supervision;HOB elevated   General bed mobility comments: Increased time and effort, use of bed rails    Transfers Overall transfer level: Needs  assistance Equipment used:  (posterior aspect of recliner) Transfers: Sit to/from Stand;Lateral/Scoot Transfers Sit to Stand: From elevated surface;Mod assist        Lateral/Scoot Transfers: Min guard General transfer comment: Practiced sit <> stand from elevated EOB pulling up on posterior aspect of recliner, cuing pt to scoot anteriorly, rock anteriorly to gain momentum, and activate quads, encouraging increased buttocks clearance as reps progressed, x12 reps with modA to clear buttocks a couple inches but not obtaining full stand position each rep. Extra time and occasional blocking of L foot with lateral scoot to L towards HOB, x5 reps.  Ambulation/Gait             General Gait Details: Unable   Stairs             Wheelchair Mobility    Modified Rankin (Stroke Patients Only)       Balance Overall balance assessment: Needs assistance Sitting-balance support: No upper extremity supported;Feet supported Sitting balance-Leahy Scale: Good Sitting balance - Comments: Dynamic sitting EOB without LOB, supervision for safety.   Standing balance support: Bilateral upper extremity supported Standing balance-Leahy Scale: Zero Standing balance comment: Unable to come to full upright standing from EOB today, focused on clearing buttocks only with modA.                            Cognition Arousal/Alertness: Awake/alert Behavior During Therapy: WFL for tasks assessed/performed Overall Cognitive Status: Impaired/Different from baseline Area of Impairment: Safety/judgement;Awareness;Following commands;Memory;Problem solving                     Memory: Decreased short-term memory Following Commands: Follows one step commands with increased time;Follows one step commands consistently;Follows multi-step  commands inconsistently Safety/Judgement: Decreased awareness of safety;Decreased awareness of deficits Awareness: Emergent Problem Solving: Slow  processing;Decreased initiation;Difficulty sequencing;Requires verbal cues;Requires tactile cues General Comments: Pt able to maintain focus well, but needs repeated instructions and cues to perform tasks, even after performing them several minutes prior. Pt benefits from simple one-step commands, having difficulty following multi-step commands.      Exercises General Exercises - Upper Extremity Shoulder Flexion: Strengthening;Both;10 reps;Theraband;Seated Theraband Level (Shoulder Flexion): Level 3 (Green) Shoulder Extension: Strengthening;Both;10 reps;Seated;Theraband Theraband Level (Shoulder Extension): Level 3 (Green) Shoulder Horizontal ABduction: Strengthening;Both;10 reps;Seated;Theraband Theraband Level (Shoulder Horizontal Abduction): Level 3 (Green) Elbow Flexion: Strengthening;Both;10 reps;Seated;Theraband Theraband Level (Elbow Flexion): Level 3 (Green) General Exercises - Lower Extremity Long Arc Quad: Strengthening;Left;10 reps;Seated;Other (comment) (green theraband; x2 sets of 10 reps each) Hip ABduction/ADduction: Strengthening;Both;10 reps;Seated;Other (comment) (green theraband) Hip Flexion/Marching: Strengthening;Both;10 reps;Seated Other Exercises Other Exercises: L leg bridges supine, x5 reps, no full buttocks clearance noted    General Comments General comments (skin integrity, edema, etc.): SpO2 >/= 88% on RA      Pertinent Vitals/Pain Pain Assessment: Faces Faces Pain Scale: Hurts a little bit Pain Location: L abdomen Pain Descriptors / Indicators: Grimacing Pain Intervention(s): Limited activity within patient's tolerance;Monitored during session;Repositioned    Home Living                      Prior Function            PT Goals (current goals can now be found in the care plan section) Acute Rehab PT Goals Patient Stated Goal: to go to CIR to get better and go home PT Goal Formulation: With patient/family Time For Goal Achievement:  10/06/20 Potential to Achieve Goals: Good Progress towards PT goals: Progressing toward goals    Frequency    Min 3X/week      PT Plan Current plan remains appropriate    Co-evaluation              AM-PAC PT "6 Clicks" Mobility   Outcome Measure  Help needed turning from your back to your side while in a flat bed without using bedrails?: A Little Help needed moving from lying on your back to sitting on the side of a flat bed without using bedrails?: A Little Help needed moving to and from a bed to a chair (including a wheelchair)?: Total Help needed standing up from a chair using your arms (e.g., wheelchair or bedside chair)?: Total Help needed to walk in hospital room?: Total Help needed climbing 3-5 steps with a railing? : Total 6 Click Score: 10    End of Session Equipment Utilized During Treatment: Gait belt Activity Tolerance: Patient tolerated treatment well Patient left: with call bell/phone within reach;in bed;with bed alarm set;with family/visitor present;with nursing/sitter in room Nurse Communication: Mobility status PT Visit Diagnosis: Unsteadiness on feet (R26.81);Muscle weakness (generalized) (M62.81);Difficulty in walking, not elsewhere classified (R26.2)     Time: 6387-5643 PT Time Calculation (min) (ACUTE ONLY): 31 min  Charges:  $Therapeutic Exercise: 8-22 mins $Therapeutic Activity: 8-22 mins                     Raymond Gurney, PT, DPT Acute Rehabilitation Services  Pager: [phone] Office: [phone]    Jewel Baize 10/01/2020, 5:03 PM

## 2020-10-02 ENCOUNTER — Inpatient Hospital Stay (HOSPITAL_COMMUNITY)
Admission: RE | Admit: 2020-10-02 | Discharge: 2020-10-24 | DRG: 945 | Disposition: A | Discharge status: Home Health | Payer: Medicare Other | Source: Intra-hospital | Attending: Physical Medicine and Rehabilitation | Admitting: Physical Medicine and Rehabilitation

## 2020-10-02 ENCOUNTER — Encounter (HOSPITAL_COMMUNITY): Payer: Self-pay | Admitting: Physical Medicine and Rehabilitation

## 2020-10-02 ENCOUNTER — Other Ambulatory Visit: Payer: Self-pay

## 2020-10-02 DIAGNOSIS — K639 Disease of intestine, unspecified: Secondary | ICD-10-CM

## 2020-10-02 DIAGNOSIS — D61818 Other pancytopenia: Secondary | ICD-10-CM | POA: Diagnosis present

## 2020-10-02 DIAGNOSIS — R338 Other retention of urine: Secondary | ICD-10-CM | POA: Diagnosis present

## 2020-10-02 DIAGNOSIS — I5033 Acute on chronic diastolic (congestive) heart failure: Secondary | ICD-10-CM | POA: Diagnosis not present

## 2020-10-02 DIAGNOSIS — D693 Immune thrombocytopenic purpura: Secondary | ICD-10-CM | POA: Diagnosis present

## 2020-10-02 DIAGNOSIS — D62 Acute posthemorrhagic anemia: Secondary | ICD-10-CM | POA: Diagnosis present

## 2020-10-02 DIAGNOSIS — Z6836 Body mass index (BMI) 36.0-36.9, adult: Secondary | ICD-10-CM

## 2020-10-02 DIAGNOSIS — Z89511 Acquired absence of right leg below knee: Secondary | ICD-10-CM

## 2020-10-02 DIAGNOSIS — J9621 Acute and chronic respiratory failure with hypoxia: Secondary | ICD-10-CM | POA: Diagnosis not present

## 2020-10-02 DIAGNOSIS — Z8673 Personal history of transient ischemic attack (TIA), and cerebral infarction without residual deficits: Secondary | ICD-10-CM

## 2020-10-02 DIAGNOSIS — E119 Type 2 diabetes mellitus without complications: Secondary | ICD-10-CM

## 2020-10-02 DIAGNOSIS — E1122 Type 2 diabetes mellitus with diabetic chronic kidney disease: Secondary | ICD-10-CM | POA: Diagnosis present

## 2020-10-02 DIAGNOSIS — E1142 Type 2 diabetes mellitus with diabetic polyneuropathy: Secondary | ICD-10-CM | POA: Diagnosis not present

## 2020-10-02 DIAGNOSIS — N189 Chronic kidney disease, unspecified: Secondary | ICD-10-CM | POA: Diagnosis present

## 2020-10-02 DIAGNOSIS — E669 Obesity, unspecified: Secondary | ICD-10-CM | POA: Diagnosis not present

## 2020-10-02 DIAGNOSIS — K219 Gastro-esophageal reflux disease without esophagitis: Secondary | ICD-10-CM | POA: Diagnosis present

## 2020-10-02 DIAGNOSIS — Z8249 Family history of ischemic heart disease and other diseases of the circulatory system: Secondary | ICD-10-CM

## 2020-10-02 DIAGNOSIS — I5032 Chronic diastolic (congestive) heart failure: Secondary | ICD-10-CM | POA: Diagnosis not present

## 2020-10-02 DIAGNOSIS — Z9081 Acquired absence of spleen: Secondary | ICD-10-CM

## 2020-10-02 DIAGNOSIS — Z83438 Family history of other disorder of lipoprotein metabolism and other lipidemia: Secondary | ICD-10-CM

## 2020-10-02 DIAGNOSIS — Z7982 Long term (current) use of aspirin: Secondary | ICD-10-CM

## 2020-10-02 DIAGNOSIS — E871 Hypo-osmolality and hyponatremia: Secondary | ICD-10-CM

## 2020-10-02 DIAGNOSIS — N183 Chronic kidney disease, stage 3 unspecified: Secondary | ICD-10-CM | POA: Diagnosis not present

## 2020-10-02 DIAGNOSIS — R7881 Bacteremia: Secondary | ICD-10-CM | POA: Diagnosis present

## 2020-10-02 DIAGNOSIS — R5381 Other malaise: Secondary | ICD-10-CM | POA: Diagnosis not present

## 2020-10-02 DIAGNOSIS — I130 Hypertensive heart and chronic kidney disease with heart failure and stage 1 through stage 4 chronic kidney disease, or unspecified chronic kidney disease: Secondary | ICD-10-CM | POA: Diagnosis present

## 2020-10-02 DIAGNOSIS — D75839 Thrombocytosis, unspecified: Secondary | ICD-10-CM

## 2020-10-02 DIAGNOSIS — D649 Anemia, unspecified: Secondary | ICD-10-CM | POA: Diagnosis not present

## 2020-10-02 DIAGNOSIS — B3749 Other urogenital candidiasis: Secondary | ICD-10-CM

## 2020-10-02 DIAGNOSIS — G546 Phantom limb syndrome with pain: Secondary | ICD-10-CM | POA: Diagnosis present

## 2020-10-02 DIAGNOSIS — E042 Nontoxic multinodular goiter: Secondary | ICD-10-CM | POA: Diagnosis not present

## 2020-10-02 DIAGNOSIS — N1831 Chronic kidney disease, stage 3a: Secondary | ICD-10-CM

## 2020-10-02 DIAGNOSIS — R269 Unspecified abnormalities of gait and mobility: Secondary | ICD-10-CM | POA: Diagnosis present

## 2020-10-02 DIAGNOSIS — Z7984 Long term (current) use of oral hypoglycemic drugs: Secondary | ICD-10-CM

## 2020-10-02 DIAGNOSIS — J9611 Chronic respiratory failure with hypoxia: Secondary | ICD-10-CM | POA: Diagnosis present

## 2020-10-02 DIAGNOSIS — M545 Low back pain, unspecified: Secondary | ICD-10-CM

## 2020-10-02 DIAGNOSIS — E1169 Type 2 diabetes mellitus with other specified complication: Secondary | ICD-10-CM | POA: Diagnosis not present

## 2020-10-02 DIAGNOSIS — Z89519 Acquired absence of unspecified leg below knee: Secondary | ICD-10-CM

## 2020-10-02 DIAGNOSIS — R933 Abnormal findings on diagnostic imaging of other parts of digestive tract: Secondary | ICD-10-CM | POA: Diagnosis not present

## 2020-10-02 DIAGNOSIS — Z833 Family history of diabetes mellitus: Secondary | ICD-10-CM

## 2020-10-02 DIAGNOSIS — I10 Essential (primary) hypertension: Secondary | ICD-10-CM | POA: Diagnosis not present

## 2020-10-02 LAB — CBC
HCT: 29.7 % — ABNORMAL LOW (ref 36.0–46.0)
Hemoglobin: 8.5 g/dL — ABNORMAL LOW (ref 12.0–15.0)
MCH: 25.1 pg — ABNORMAL LOW (ref 26.0–34.0)
MCHC: 28.6 g/dL — ABNORMAL LOW (ref 30.0–36.0)
MCV: 87.9 fL (ref 80.0–100.0)
Platelets: 892 10*3/uL — ABNORMAL HIGH (ref 150–400)
RBC: 3.38 MIL/uL — ABNORMAL LOW (ref 3.87–5.11)
RDW: 24.3 % — ABNORMAL HIGH (ref 11.5–15.5)
WBC: 13.3 10*3/uL — ABNORMAL HIGH (ref 4.0–10.5)
nRBC: 0 % (ref 0.0–0.2)

## 2020-10-02 LAB — GLUCOSE, CAPILLARY
Glucose-Capillary: 102 mg/dL — ABNORMAL HIGH (ref 70–99)
Glucose-Capillary: 107 mg/dL — ABNORMAL HIGH (ref 70–99)
Glucose-Capillary: 110 mg/dL — ABNORMAL HIGH (ref 70–99)
Glucose-Capillary: 131 mg/dL — ABNORMAL HIGH (ref 70–99)
Glucose-Capillary: 144 mg/dL — ABNORMAL HIGH (ref 70–99)
Glucose-Capillary: 176 mg/dL — ABNORMAL HIGH (ref 70–99)

## 2020-10-02 LAB — BRAIN NATRIURETIC PEPTIDE: B Natriuretic Peptide: 569 pg/mL — ABNORMAL HIGH (ref 0.0–100.0)

## 2020-10-02 MED ORDER — FERROUS GLUCONATE 324 (38 FE) MG PO TABS: 324.0000 mg | ORAL_TABLET | Freq: Two times a day (BID) | ORAL | 3 refills | Status: DC

## 2020-10-02 MED ORDER — GABAPENTIN 400 MG PO CAPS
400.0000 mg | ORAL_CAPSULE | Freq: Two times a day (BID) | ORAL | Status: DC
  Administered 2020-10-02 – 2020-10-23 (×43): 400 mg via ORAL
  Filled 2020-10-02 (×44): qty 1

## 2020-10-02 MED ORDER — GUAIFENESIN-DM 100-10 MG/5ML PO SYRP
5.0000 mL | ORAL_SOLUTION | Freq: Four times a day (QID) | ORAL | Status: DC | PRN
  Filled 2020-10-02: qty 10

## 2020-10-02 MED ORDER — BISACODYL 10 MG RE SUPP: 10.0000 mg | Freq: Every day | RECTAL | Status: DC | PRN

## 2020-10-02 MED ORDER — PROCHLORPERAZINE MALEATE 5 MG PO TABS
5.0000 mg | ORAL_TABLET | Freq: Four times a day (QID) | ORAL | Status: DC | PRN
  Administered 2020-10-19: 5 mg via ORAL
  Filled 2020-10-02: qty 1

## 2020-10-02 MED ORDER — BETHANECHOL CHLORIDE 10 MG PO TABS
10.0000 mg | ORAL_TABLET | Freq: Three times a day (TID) | ORAL | Status: DC
  Administered 2020-10-02 – 2020-10-20 (×55): 10 mg via ORAL
  Filled 2020-10-02 (×60): qty 1

## 2020-10-02 MED ORDER — ASPIRIN EC 81 MG PO TBEC: 81.0000 mg | DELAYED_RELEASE_TABLET | Freq: Every day | ORAL | 11 refills | Status: DC

## 2020-10-02 MED ORDER — OXYCODONE HCL 5 MG PO TABS
5.0000 mg | ORAL_TABLET | ORAL | Status: DC | PRN
  Administered 2020-10-02: 10 mg via ORAL
  Administered 2020-10-03: 5 mg via ORAL
  Filled 2020-10-02: qty 2
  Filled 2020-10-02: qty 1

## 2020-10-02 MED ORDER — ENOXAPARIN SODIUM 60 MG/0.6ML IJ SOSY
50.0000 mg | PREFILLED_SYRINGE | INTRAMUSCULAR | Status: DC
  Administered 2020-10-03 – 2020-10-23 (×21): 50 mg via SUBCUTANEOUS
  Filled 2020-10-02 (×21): qty 0.6

## 2020-10-02 MED ORDER — PANTOPRAZOLE SODIUM 40 MG PO TBEC
40.0000 mg | DELAYED_RELEASE_TABLET | Freq: Every day | ORAL | Status: DC
  Administered 2020-10-03 – 2020-10-23 (×21): 40 mg via ORAL
  Filled 2020-10-02 (×22): qty 1

## 2020-10-02 MED ORDER — AMOXICILLIN-POT CLAVULANATE 875-125 MG PO TABS
1.0000 | ORAL_TABLET | Freq: Two times a day (BID) | ORAL | Status: AC
  Administered 2020-10-02 – 2020-10-20 (×36): 1 via ORAL
  Filled 2020-10-02 (×37): qty 1

## 2020-10-02 MED ORDER — ACETAMINOPHEN 325 MG PO TABS: 650.0000 mg | ORAL_TABLET | ORAL | Status: DC | PRN

## 2020-10-02 MED ORDER — ENOXAPARIN SODIUM 60 MG/0.6ML IJ SOSY: 50.0000 mg | PREFILLED_SYRINGE | INTRAMUSCULAR | Status: DC

## 2020-10-02 MED ORDER — AMOXICILLIN-POT CLAVULANATE 875-125 MG PO TABS: 1.0000 | ORAL_TABLET | Freq: Two times a day (BID) | ORAL | 0 refills | Status: DC

## 2020-10-02 MED ORDER — METOPROLOL SUCCINATE ER 50 MG PO TB24
50.0000 mg | ORAL_TABLET | Freq: Every day | ORAL | Status: DC
  Administered 2020-10-03 – 2020-10-04 (×2): 50 mg via ORAL
  Filled 2020-10-02 (×2): qty 1

## 2020-10-02 MED ORDER — ROSUVASTATIN CALCIUM 20 MG PO TABS
20.0000 mg | ORAL_TABLET | Freq: Every day | ORAL | Status: DC
  Administered 2020-10-03 – 2020-10-23 (×21): 20 mg via ORAL
  Filled 2020-10-02 (×22): qty 1

## 2020-10-02 MED ORDER — PROCHLORPERAZINE EDISYLATE 10 MG/2ML IJ SOLN: 5.0000 mg | Freq: Four times a day (QID) | INTRAMUSCULAR | Status: DC | PRN

## 2020-10-02 MED ORDER — ACETAMINOPHEN 325 MG PO TABS
325.0000 mg | ORAL_TABLET | ORAL | Status: DC | PRN
  Administered 2020-10-14: 650 mg via ORAL
  Filled 2020-10-02 (×2): qty 2

## 2020-10-02 MED ORDER — LOPERAMIDE HCL 2 MG PO CAPS
2.0000 mg | ORAL_CAPSULE | ORAL | 0 refills | Status: DC | PRN
Start: 2020-10-02 — End: 2023-02-19

## 2020-10-02 MED ORDER — FOLIC ACID 1 MG PO TABS: 2.0000 mg | ORAL_TABLET | Freq: Every day | ORAL | Status: DC

## 2020-10-02 MED ORDER — POLYETHYLENE GLYCOL 3350 17 G PO PACK: 17.0000 g | PACK | Freq: Every day | ORAL | Status: DC | PRN

## 2020-10-02 MED ORDER — PANTOPRAZOLE SODIUM 40 MG PO TBEC: 40.0000 mg | DELAYED_RELEASE_TABLET | Freq: Every day | ORAL | Status: DC

## 2020-10-02 MED ORDER — INSULIN ASPART 100 UNIT/ML IJ SOLN
0.0000 [IU] | Freq: Three times a day (TID) | INTRAMUSCULAR | Status: DC
  Administered 2020-10-02 – 2020-10-04 (×3): 2 [IU] via SUBCUTANEOUS
  Administered 2020-10-04: 3 [IU] via SUBCUTANEOUS
  Administered 2020-10-04 – 2020-10-07 (×8): 2 [IU] via SUBCUTANEOUS
  Administered 2020-10-07 – 2020-10-08 (×3): 3 [IU] via SUBCUTANEOUS
  Administered 2020-10-09 – 2020-10-12 (×5): 2 [IU] via SUBCUTANEOUS
  Administered 2020-10-12: 3 [IU] via SUBCUTANEOUS
  Administered 2020-10-12 – 2020-10-17 (×8): 2 [IU] via SUBCUTANEOUS
  Administered 2020-10-18: 3 [IU] via SUBCUTANEOUS
  Administered 2020-10-18 – 2020-10-19 (×2): 2 [IU] via SUBCUTANEOUS
  Administered 2020-10-20: 1 [IU] via SUBCUTANEOUS
  Administered 2020-10-20 – 2020-10-22 (×6): 2 [IU] via SUBCUTANEOUS
  Administered 2020-10-23: 3 [IU] via SUBCUTANEOUS
  Administered 2020-10-23: 2 [IU] via SUBCUTANEOUS

## 2020-10-02 MED ORDER — FOLIC ACID 1 MG PO TABS
2.0000 mg | ORAL_TABLET | Freq: Every day | ORAL | Status: DC
  Administered 2020-10-03 – 2020-10-23 (×21): 2 mg via ORAL
  Filled 2020-10-02 (×22): qty 2

## 2020-10-02 MED ORDER — PROCHLORPERAZINE 25 MG RE SUPP: 12.5000 mg | Freq: Four times a day (QID) | RECTAL | Status: DC | PRN

## 2020-10-02 MED ORDER — ISOSORBIDE MONONITRATE ER 30 MG PO TB24
30.0000 mg | ORAL_TABLET | Freq: Every day | ORAL | Status: DC
  Administered 2020-10-03 – 2020-10-11 (×9): 30 mg via ORAL
  Filled 2020-10-02 (×9): qty 1

## 2020-10-02 MED ORDER — FUROSEMIDE 40 MG PO TABS: 40.0000 mg | ORAL_TABLET | Freq: Two times a day (BID) | ORAL | Status: DC

## 2020-10-02 MED ORDER — DIPHENHYDRAMINE HCL 12.5 MG/5ML PO ELIX: 12.5000 mg | ORAL_SOLUTION | Freq: Four times a day (QID) | ORAL | Status: DC | PRN

## 2020-10-02 MED ORDER — FUROSEMIDE 40 MG PO TABS
40.0000 mg | ORAL_TABLET | Freq: Two times a day (BID) | ORAL | Status: DC
  Administered 2020-10-02 – 2020-10-06 (×8): 40 mg via ORAL
  Filled 2020-10-02 (×8): qty 1

## 2020-10-02 MED ORDER — METOPROLOL SUCCINATE ER 50 MG PO TB24
50.0000 mg | ORAL_TABLET | Freq: Every day | ORAL | Status: DC
  Administered 2020-10-02: 50 mg via ORAL
  Filled 2020-10-02: qty 1

## 2020-10-02 MED ORDER — POLYVINYL ALCOHOL 1.4 % OP SOLN
1.0000 [drp] | OPHTHALMIC | Status: DC | PRN
  Filled 2020-10-02: qty 15

## 2020-10-02 MED ORDER — LOPERAMIDE HCL 2 MG PO CAPS
2.0000 mg | ORAL_CAPSULE | ORAL | Status: DC | PRN
  Administered 2020-10-02 – 2020-10-20 (×3): 2 mg via ORAL
  Filled 2020-10-02 (×3): qty 1

## 2020-10-02 MED ORDER — BETHANECHOL CHLORIDE 10 MG PO TABS: 10.0000 mg | ORAL_TABLET | Freq: Three times a day (TID) | ORAL | 0 refills | Status: DC

## 2020-10-02 MED ORDER — INSULIN ASPART 100 UNIT/ML IJ SOLN
0.0000 [IU] | INTRAMUSCULAR | Status: DC
Start: 2020-10-02 — End: 2020-10-02

## 2020-10-02 MED ORDER — ALUM & MAG HYDROXIDE-SIMETH 200-200-20 MG/5ML PO SUSP: 30.0000 mL | ORAL | Status: DC | PRN

## 2020-10-02 MED ORDER — AMMONIUM LACTATE 12 % EX LOTN
TOPICAL_LOTION | Freq: Two times a day (BID) | CUTANEOUS | Status: DC
  Filled 2020-10-02 (×2): qty 225

## 2020-10-02 MED ORDER — TRAZODONE HCL 50 MG PO TABS
25.0000 mg | ORAL_TABLET | Freq: Every evening | ORAL | Status: DC | PRN
  Filled 2020-10-02: qty 1

## 2020-10-02 MED ORDER — FLEET ENEMA 7-19 GM/118ML RE ENEM: 1.0000 | ENEMA | Freq: Once | RECTAL | Status: DC | PRN

## 2020-10-02 NOTE — TOC Transition Note (Signed)
Transition of Care [facility]) - CM/SW Discharge Note   Patient Details  Name: ESTI DEMELLO MRN: [id number] Date of Birth: 02/08/1965  [facility]) CM/SW Contact:  Leone Haven, RN Phone Number: 10/02/2020, 10:12 AM   Clinical Narrative:    CIR has a bed for patient today.   Final next level of care: IP Rehab Facility Barriers to Discharge: No Barriers Identified   Patient Goals and CMS Choice Patient states their goals for this hospitalization and ongoing recovery are:: dc to CIR      Discharge Placement                       Discharge Plan and Services                  DME Agency: NA                  Social Determinants of Health (SDOH) Interventions Food Insecurity Interventions: Intervention Not Indicated Financial Strain Interventions: Intervention Not Indicated Housing Interventions: Intervention Not Indicated Transportation Interventions: [facility]   Readmission Risk Interventions Readmission Risk Prevention Plan 03/11/2020 04/13/2019 11/13/2018  Transportation Screening Complete Complete Complete  PCP or Specialist Appt within 5-7 Days - - Complete  PCP or Specialist Appt within 3-5 Days Complete Complete -  Home Care Screening - - Complete  Medication Review (RN CM) - - Complete  HRI or Home Care Consult Complete Complete -  Social Work Consult for Recovery Care Planning/Counseling Complete Complete -  Palliative Care Screening Not Applicable Not Applicable -  Medication Review [employer]) Complete Complete -  Some recent data might be hidden

## 2020-10-02 NOTE — Plan of Care (Signed)

## 2020-10-02 NOTE — Progress Notes (Signed)
Patient arrived at approximately 1515 via bed. Patient is A&O x 4 and able to make her need known. Patient denies pain or discomfort at this time. IV  RUA 20 G, flushed, dressing CDI. Patient is incontinent of bowel and bladder. Lung sounds clear bilateral,  ABD soft non tender bowel sounds x4. Left side of ABD JP drain removed prior to arrival covered with boarder gauze. Boarder gauze removed, skin CDI no drainage noted. Old right BKA, shrinker in place. LLE edema  +2 skin dry positive petal pulses and cap refill less than 3. Old calluses on bottom of patients feet. Patient acknowledges belongings policy, has her purse with credit cards in money at bedside. Patient refused to have her belongings locked up with security.  Call light and personal items within  reach. Bed in low position, safety measures in place.

## 2020-10-02 NOTE — H&P (Signed)
Physical Medicine and Rehabilitation Admission H&P    Chief Complaint  Patient presents with   Functional deficits due to debility    HPI:  [name] is a 55 year old female with history of  HTN, T2DM, stroke, ICH, urinary retention-foley dependent, chronic hypoxic respiratory failure, R-BKA (CIR 2013), recurrent acute ITP (Dr. Lindi Adie) treated with IVIG/Nplate who was admitted on 09/12/20 with fatigue, Hgb 4.5, WBC 2.8, platelets <50,000 and AKI. Patient had not followed up with cancer center and run out of lasix X one months. Fluid overload treated with IV diuresis, 2 units PRBC and hyperkalemia treated with lokelma. She was treated with decadron, Nplate and IVIG. Elevated BS treated with insulin and decadron taper.  She was received aranesp and feraheme for supplement and splenectomy recommended by Dr. Marin Olp. She received triple vaccine-PNA/Haemophilus/meningococcus and was agreeable to undergo splenectomy on 09/15 by Dr. Barry Dienes.   Post op developed leucocytosis with fever up to 102.2 and was started ton Vanc/Cefepime/Flagyl for sepsis due to staph epi bacteremia with left pleural effusion. Pleural effusion tapped 09/19 for 500 cc fluid and cultures negative for infection.  She developed hypoxia with MS changes and CXR showed slight increase in left mid zone opacity. CT chest done 09/21 showing complex partially loculated pleural effusion with airspace consolidation in LLL concerning for PNA and assymetric enlargement of left thyroid lobe. CT abdomen was negative for infection but showed cecal wall thickening and Dr. Carlean Purl recommended colonoscopy for evaluation once medically appropriate or nearing discharge. Thyroid ultrasound showed two left thyroid masses with recommendations for FNA/surgical evaluation after discharge and to follow up with Dr. Verlee Monte for left effusion.    Dr. Gale Journey recommends IV antibiotics thorough 09/26 followed by 3 weeks of Augmentin given effusion.  JP drain  remains in place and small hematoma suspected adjacent to incision --JP pulled today. Leucocytosis resolving and felt to be steroid effect. She continues to be limited by weakness and has difficulty with standing. CIR recommended due to functional decline.   Pt reports her LBM was today- not SOB- not wearing O2 although sats running 86-92% when I was in room.  Using purewick-   Review of Systems  Constitutional:  Negative for chills and fever.  HENT:  Negative for hearing loss.   Eyes:  Negative for blurred vision.  Respiratory:  Negative for cough, shortness of breath and stridor.   Cardiovascular:  Positive for leg swelling. Negative for chest pain.  Gastrointestinal:  Negative for constipation, nausea and vomiting.  Genitourinary:  Negative for dysuria.  Musculoskeletal:  Negative for myalgias.  Neurological:  Positive for weakness. Negative for speech change and headaches.  Psychiatric/Behavioral:  The patient is not nervous/anxious.   All other systems reviewed and are negative.   Past Medical History:  Diagnosis Date   Acute exacerbation of CHF (congestive heart failure) ([place]) 04/11/2019   Anemia    Arthritis    "spine, hands" (11/25/2015)   Back pain    "all my back; probably 3 times/week" (11/25/2015)   Chronic indwelling Foley catheter 03/04/2017   Diabetic foot ulcer associated with type 2 diabetes mellitus ([place])    Hypertension    Intracerebral hemorrhage ([place]) As a teenager    States she had burst blood vessel as teenager, now with resultant minor visual field and hearing deficits   Neuropathy    Stroke ([place]) ~ 1982   "my feeling on my RLE, right lower eye vision,  & hearing out of my right ear not  the same since" (11/25/2015)   Type II diabetes mellitus [facility])     Past Surgical History:  Procedure Laterality Date   AMPUTATION  11/24/2010   Procedure: AMPUTATION FOOT;  Surgeon: Wylene Simmer, MD;  Location: [place];  Service: Orthopedics;  Laterality: Right;  Right  FOOT  TRANS METATARSAL AMPUTATION   AMPUTATION  07/02/2011   Procedure: AMPUTATION BELOW KNEE;  Surgeon: Wylene Simmer, MD;  Location: [place];  Service: Orthopedics;  Laterality: Right;   APPLICATION OF WOUND VAC  07/02/2011   Procedure: APPLICATION OF WOUND VAC;  Surgeon: Wylene Simmer, MD;  Location: [place];  Service: Orthopedics;  Laterality: Right;   BRAIN SURGERY  1980's   Previous brain surgery in [place], [place]  2004; 2006   COLONOSCOPY N/A 09/17/2015   Procedure: COLONOSCOPY;  Surgeon: Wilford Corner, MD;  Location: [facility] ENDOSCOPY;  Service: Endoscopy;  Laterality: N/A;   ESOPHAGOGASTRODUODENOSCOPY N/A 09/17/2015   Procedure: ESOPHAGOGASTRODUODENOSCOPY (EGD);  Surgeon: Wilford Corner, MD;  Location[facility] ENDOSCOPY;  Service: Endoscopy;  Laterality: N/A;   I & D EXTREMITY  11/24/2010   Procedure: IRRIGATION AND DEBRIDEMENT EXTREMITY;  Surgeon: Wylene Simmer, MD;  Location: [place];  Service: Orthopedics;  Laterality: Left;  Debriedment of left plantar ulcer and trimming of toenails   I & D EXTREMITY  07/02/2011   Procedure: IRRIGATION AND DEBRIDEMENT EXTREMITY;  Surgeon: Wylene Simmer, MD;  Location: [place];  Service: Orthopedics;  Laterality: Left;  I & D of Left Foot   I & D EXTREMITY  07/06/2011   Procedure: IRRIGATION AND DEBRIDEMENT EXTREMITY;  Surgeon: Wylene Simmer, MD;  Location: [place];  Service: Orthopedics;  Laterality: Right;  IRRIGATION/DEBRIDEMENT RIGHT BELOW KNEE AMPUTATION   IR THORACENTESIS ASP PLEURAL SPACE W/IMG GUIDE  09/22/2020   LAPAROSCOPIC SPLENECTOMY N/A 09/18/2020   Procedure: HAND ASSISTED LAPAROSCOPIC SPLENECTOMY;  Surgeon: Stark Klein, MD;  Location: [place];  Service: General;  Laterality: N/A;  [place]  2006    Family History  Problem Relation Age of Onset   Diabetes Mother    Hypertension Mother    Hyperlipidemia Mother    Diabetes Father    Cancer Father        prostate   Hyperlipidemia Father    Hypertension Father    Diabetes Brother     Peripheral Artery Disease Brother        has had toes amputated   Diabetes Son     Social History: Lives wit 2 teenage children. She reports that she has never smoked. She has never used smokeless tobacco. She reports current alcohol use of about 2.0 standard drinks per week. She reports that she does not use drugs.   Allergies: No Known Allergies   Medications Prior to Admission  Medication Sig Dispense Refill   acetaminophen (TYLENOL) 325 MG tablet Take 2 tablets (650 mg total) by mouth every 4 (four) hours as needed for headache, mild pain or fever.     amoxicillin-clavulanate (AUGMENTIN) 875-125 MG tablet Take 1 tablet by mouth every 12 (twelve) hours for 18 days. 36 tablet 0   aspirin EC 81 MG tablet Take 1 tablet (81 mg total) by mouth daily. 30 tablet 11   bethanechol (URECHOLINE) 10 MG tablet Take 1 tablet (10 mg total) by mouth 3 (three) times daily for 10 days. 30 tablet 0   Blood Glucose Monitoring Suppl (PRODIGY AUTOCODE BLOOD GLUCOSE) w/Device KIT 1 kit by Does not apply route in the morning,  at noon, in the evening, and at bedtime. 1 kit 0   ferrous gluconate (FERGON) 324 MG tablet Take 1 tablet (324 mg total) by mouth 2 (two) times daily with a meal. 30 tablet 3   [START ON 0/96/2836] folic acid (FOLVITE) 1 MG tablet Take 2 tablets (2 mg total) by mouth daily.     furosemide (LASIX) 40 MG tablet Take 1 tablet (40 mg total) by mouth 2 (two) times daily. 30 tablet    gabapentin (NEURONTIN) 400 MG capsule Take 1 capsule (400 mg total) by mouth 2 (two) times daily. 180 capsule 3   isosorbide mononitrate (IMDUR) 30 MG 24 hr tablet Take 1 tablet (30 mg total) by mouth daily. 90 tablet 3   loperamide (IMODIUM) 2 MG capsule Take 1 capsule (2 mg total) by mouth as needed for diarrhea or loose stools. 30 capsule 0   metFORMIN (GLUCOPHAGE) 500 MG tablet Take 500 mg by mouth 2 (two) times daily.     metoprolol succinate (TOPROL-XL) 50 MG 24 hr tablet Take 1 tablet (50 mg total) by  mouth daily. Take with or immediately following a meal. 90 tablet 3   [START ON 10/03/2020] pantoprazole (PROTONIX) 40 MG tablet Take 1 tablet (40 mg total) by mouth daily.     polyvinyl alcohol (LIQUIFILM TEARS) 1.4 % ophthalmic solution Place 1 drop into both eyes as needed for dry eyes. (Patient not taking: No sig reported)     rosuvastatin (CRESTOR) 20 MG tablet Take 1 tablet (20 mg total) by mouth daily. 90 tablet 3    Drug Regimen Review  Drug regimen was reviewed and remains appropriate with no significant issues identified  Home: Home Living Family/patient expects to be discharged to:: Private residence Living Arrangements:  (60 year old son, Mom in [place] and visits frequently) Available Help at Discharge: Family, Available PRN/intermittently Type of Home: House Home Access: Stairs to enter Technical brewer of Steps: 1 Entrance Stairs-Rails: None Home Layout: One level Bathroom Shower/Tub: Other (comment) Bathroom Toilet: Handicapped height Bathroom Accessibility: Yes Home Equipment: Bedside commode, [place] - 2 wheels, Wheelchair - manual, Hospital bed  Lives With: Son   Functional History: Prior Function Level of Independence: Needs assistance Gait / Transfers Assistance Needed: uses w/c primarily because she can't get prosthesis on due to swelling ADL's / Homemaking Assistance Needed: son helps with transfers as of recent onto commode, bath and dresses self, son drives and grocery shops  Functional Status:  Mobility: Bed Mobility Overal bed mobility: Needs Assistance Bed Mobility: Supine to Sit, Sit to Supine Rolling: Min assist Supine to sit: Supervision, HOB elevated Sit to supine: Supervision, HOB elevated General bed mobility comments: Increased time and effort, use of bed rails Transfers Overall transfer level: Needs assistance Equipment used:  (posterior aspect of recliner) Transfers: Sit to/from Stand, Lateral/Scoot Transfers Sit to Stand: From elevated  surface, Mod assist Stand pivot transfers: Max assist, +2 physical assistance, +2 safety/equipment Squat pivot transfers: Mod assist Anterior-Posterior transfers: Min assist, From elevated surface  Lateral/Scoot Transfers: Min guard General transfer comment: Practiced sit <> stand from elevated EOB pulling up on posterior aspect of recliner, cuing pt to scoot anteriorly, rock anteriorly to gain momentum, and activate quads, encouraging increased buttocks clearance as reps progressed, x12 reps with modA to clear buttocks a couple inches but not obtaining full stand position each rep. Extra time and occasional blocking of L foot with lateral scoot to L towards HOB, x5 reps. Ambulation/Gait General Gait Details: Unable  ADL: ADL Overall ADL's : Needs assistance/impaired Eating/Feeding: Independent, Sitting Grooming: Wash/dry hands, Wash/dry face, Set up, Sitting, Oral care Upper Body Bathing: Supervision/ safety, Sitting Lower Body Bathing: Minimal assistance, Bed level Upper Body Dressing : Set up, Sitting Lower Body Dressing: Moderate assistance, Sitting/lateral leans Toilet Transfer: Moderate assistance, Transfer board, +2 for safety/equipment, Requires wide/bariatric, Requires drop arm Functional mobility during ADLs: Moderate assistance, +2 for physical assistance General ADL Comments: +2 from lower surface  Cognition: Cognition Overall Cognitive Status: Impaired/Different from baseline Orientation Level: Oriented X4 Cognition Arousal/Alertness: Awake/alert Behavior During Therapy: WFL for tasks assessed/performed Overall Cognitive Status: Impaired/Different from baseline Area of Impairment: Safety/judgement, Awareness, Following commands, Memory, Problem solving Current Attention Level: Sustained Memory: Decreased short-term memory Following Commands: Follows one step commands with increased time, Follows one step commands consistently, Follows multi-step commands  inconsistently Safety/Judgement: Decreased awareness of safety, Decreased awareness of deficits Awareness: Emergent Problem Solving: Slow processing, Decreased initiation, Difficulty sequencing, Requires verbal cues, Requires tactile cues General Comments: Pt able to maintain focus well, but needs repeated instructions and cues to perform tasks, even after performing them several minutes prior. Pt benefits from simple one-step commands, having difficulty following multi-step commands.   Blood pressure (!) 170/73, pulse 81, temperature 98.4 F (36.9 C), temperature source Oral, resp. rate 18, height 5' 7" (1.702 m), weight 107 kg, SpO2 97 %. Physical Exam Vitals and nursing note reviewed.  Constitutional:      Appearance: Normal appearance.     Comments: Sitting up in bed- O2 on lap; eating lunch; NAD  HENT:     Head: Normocephalic and atraumatic.     Right Ear: External ear normal.     Left Ear: External ear normal.     Nose: Nose normal. No congestion.     Mouth/Throat:     Mouth: Mucous membranes are moist.     Pharynx: Oropharynx is clear. No oropharyngeal exudate.  Eyes:     General:        Right eye: No discharge.        Left eye: No discharge.     Extraocular Movements: Extraocular movements intact.  Cardiovascular:     Rate and Rhythm: Normal rate and regular rhythm.     Heart sounds: Normal heart sounds. No murmur heard.   No gallop.  Pulmonary:     Effort: Pulmonary effort is normal.     Breath sounds: Normal breath sounds.     Comments: CTA B/L- good air movement- O2 sats 86% to 92% on RA Abdominal:     General: There is distension (LLQ with edema extending to left upper thigh).     Comments: LUQ wound with dry dressing. - serous drainage- not through dressing Soft, NT, ND, (+)BS  Genitourinary:    Comments: Purewick in place Musculoskeletal:     Cervical back: Normal range of motion and neck supple.     Comments: Ues- 4+/5 except 4/5 in FA B/L Les; RLE- 4+/5 in  HF/KE/KF- old R BKA LLE- HF 4-/5, distally 4+/5    Skin:    Comments: Multiple healing lesions on left shin?  And healing wound on R popliteal fossa- very superficial- no drainage.  Mild hand swelling L>R  Neurological:     Mental Status: She is alert and oriented to person, place, and time.     Comments: Speech clear. Able to follow commands without difficulty.  Intact to light touch in Ue's B/L and decreased below knee on LLE  Psychiatric:  Mood and Affect: Mood normal.        Behavior: Behavior normal.    Results for orders placed or performed during the hospital encounter of 09/12/20 (from the past 48 hour(s))  Glucose, capillary     Status: Abnormal   Collection Time: 09/30/20  7:58 PM  Result Value Ref Range   Glucose-Capillary 140 (H) 70 - 99 mg/dL    Comment: Glucose reference range applies only to samples taken after fasting for at least 8 hours.   Comment 1 Notify RN    Comment 2 Document in Chart   Glucose, capillary     Status: Abnormal   Collection Time: 10/01/20 12:06 AM  Result Value Ref Range   Glucose-Capillary 126 (H) 70 - 99 mg/dL    Comment: Glucose reference range applies only to samples taken after fasting for at least 8 hours.   Comment 1 Notify RN    Comment 2 Document in Chart   Renal function panel     Status: Abnormal   Collection Time: 10/01/20  3:44 AM  Result Value Ref Range   Sodium 136 135 - 145 mmol/L   Potassium 3.8 3.5 - 5.1 mmol/L   Chloride 103 98 - 111 mmol/L   CO2 27 22 - 32 mmol/L   Glucose, Bld 92 70 - 99 mg/dL    Comment: Glucose reference range applies only to samples taken after fasting for at least 8 hours.   BUN 18 6 - 20 mg/dL   Creatinine, Ser 1.00 0.44 - 1.00 mg/dL   Calcium 8.3 (L) 8.9 - 10.3 mg/dL   Phosphorus 2.9 2.5 - 4.6 mg/dL   Albumin 2.2 (L) 3.5 - 5.0 g/dL   GFR, Estimated >60 >60 mL/min    Comment: (NOTE) Calculated using the CKD-EPI Creatinine Equation (2021)    Anion gap 6 5 - 15    Comment: Performed  at [facility] [address] [place], [place] [address]  Magnesium     Status: None   Collection Time: 10/01/20  3:44 AM  Result Value Ref Range   Magnesium 1.9 1.7 - 2.4 mg/dL    Comment: Performed at [facility] [address] [place], [place] [address]  CBC     Status: Abnormal   Collection Time: 10/01/20  3:44 AM  Result Value Ref Range   WBC 13.9 (H) 4.0 - 10.5 K/uL   RBC 3.25 (L) 3.87 - 5.11 MIL/uL   Hemoglobin 8.0 (L) 12.0 - 15.0 g/dL   HCT 27.7 (L) 36.0 - 46.0 %   MCV 85.2 80.0 - 100.0 fL   MCH 24.6 (L) 26.0 - 34.0 pg   MCHC 28.9 (L) 30.0 - 36.0 g/dL   RDW 24.7 (H) 11.5 - 15.5 %   Platelets 870 (H) 150 - 400 K/uL   nRBC 0.1 0.0 - 0.2 %    Comment: Performed at [facility], [place] [address] [place], [place] [address]  Brain natriuretic peptide     Status: Abnormal   Collection Time: 10/01/20  3:44 AM  Result Value Ref Range   B Natriuretic Peptide 619.5 (H) 0.0 - 100.0 pg/mL    Comment: Performed at [facility] [address], [place], [place] [address]  Glucose, capillary     Status: Abnormal   Collection Time: 10/01/20  4:00 AM  Result Value Ref Range   Glucose-Capillary 110 (H) 70 - 99 mg/dL    Comment: Glucose reference range applies only to  samples taken after fasting for at least 8 hours.   Comment 1 Notify RN    Comment 2 Document in Chart   Glucose, capillary     Status: Abnormal   Collection Time: 10/01/20  6:53 AM  Result Value Ref Range   Glucose-Capillary 109 (H) 70 - 99 mg/dL    Comment: Glucose reference range applies only to samples taken after fasting for at least 8 hours.  Glucose, capillary     Status: Abnormal   Collection Time: 10/01/20 11:37 AM  Result Value Ref Range   Glucose-Capillary 151 (H) 70 - 99 mg/dL    Comment: Glucose reference range applies only to samples taken after fasting for at least 8 hours.  Glucose, capillary     Status: Abnormal   Collection Time: 10/01/20  4:23 PM  Result Value Ref Range    Glucose-Capillary 153 (H) 70 - 99 mg/dL    Comment: Glucose reference range applies only to samples taken after fasting for at least 8 hours.  Glucose, capillary     Status: Abnormal   Collection Time: 10/01/20  8:07 PM  Result Value Ref Range   Glucose-Capillary 151 (H) 70 - 99 mg/dL    Comment: Glucose reference range applies only to samples taken after fasting for at least 8 hours.   Comment 1 Notify RN    Comment 2 Document in Chart   Glucose, capillary     Status: Abnormal   Collection Time: 10/02/20 12:05 AM  Result Value Ref Range   Glucose-Capillary 110 (H) 70 - 99 mg/dL    Comment: Glucose reference range applies only to samples taken after fasting for at least 8 hours.   Comment 1 Notify RN    Comment 2 Document in Chart   Glucose, capillary     Status: Abnormal   Collection Time: 10/02/20  4:19 AM  Result Value Ref Range   Glucose-Capillary 107 (H) 70 - 99 mg/dL    Comment: Glucose reference range applies only to samples taken after fasting for at least 8 hours.   Comment 1 Notify RN    Comment 2 Document in Chart   CBC     Status: Abnormal   Collection Time: 10/02/20  4:57 AM  Result Value Ref Range   WBC 13.3 (H) 4.0 - 10.5 K/uL   RBC 3.38 (L) 3.87 - 5.11 MIL/uL   Hemoglobin 8.5 (L) 12.0 - 15.0 g/dL   HCT 29.7 (L) 36.0 - 46.0 %   MCV 87.9 80.0 - 100.0 fL   MCH 25.1 (L) 26.0 - 34.0 pg   MCHC 28.6 (L) 30.0 - 36.0 g/dL   RDW 24.3 (H) 11.5 - 15.5 %   Platelets 892 (H) 150 - 400 K/uL    Comment: REPEATED TO VERIFY   nRBC 0.0 0.0 - 0.2 %    Comment: Performed at [facility], [place] [address], [place], [place] [address]  Brain natriuretic peptide     Status: Abnormal   Collection Time: 10/02/20  4:57 AM  Result Value Ref Range   B Natriuretic Peptide 569.0 (H) 0.0 - 100.0 pg/mL    Comment: Performed at [facility] [address] [place], [place] [address]  Glucose, capillary     Status: Abnormal   Collection Time: 10/02/20  8:24 AM  Result Value Ref  Range   Glucose-Capillary 102 (H) 70 - 99 mg/dL    Comment: Glucose reference range applies only to samples taken after fasting for at  least 8 hours.  Glucose, capillary     Status: Abnormal   Collection Time: 10/02/20 11:54 AM  Result Value Ref Range   Glucose-Capillary 131 (H) 70 - 99 mg/dL    Comment: Glucose reference range applies only to samples taken after fasting for at least 8 hours.   No results found.     Medical Problem List and Plan: 1.  Debility secondary to ITP/s/p splenectomy and old R BKA- hasn't worn her new prosthesis due to fitting issues  -patient may  shower if cover dressing  -ELOS/Goals: 10-14 days- mod I to supervision 2.  Antithrombotics: -DVT/anticoagulation:  Pharmaceutical: Lovenox  -antiplatelet therapy: N/A 3. Pain Management: Oxycodone prn.  4. Mood: LCSW to follow for evaluation and support.   -antipsychotic agents: N/A 5. Neuropsych: This patient is capable of making decisions on her own behalf. 6. Skin/Wound Care: Monitor wound for healing.   7. Fluids/Electrolytes/Nutrition: Monitor I/O. Check CMET in am.  8. ITP/pancytopenia/anemia:Improving with Hgb-8.5, WBC 13.3 and platelets -892  --recheck CBC in am.  9. MRSE bacteremia/HAP: IV antibiotics transitioned to Augmentin thru 10/20/20 10. Left pleural effusion s/p thoracotomy 9/19: To follow up with pulmonary after discharge.    --encourage pulmonary hygiene with flutter valve.  11. Acute on chronic CHF: Lasix decreased to 40 mg bid. Amlodipine d/c.   --monitor I/O. Check weights daily. Monitor for signs of overload.  --continue Toprol XL, Lasix,  12. HTN: Monitor BP TID--on Toprol, Lasix, Imdur.  13. T2DM: Hgb A1C- 5.5 and well controlled on metformin BID PTA  --on hold due to AKI. Will continue to monitor BS ac/hs and use SSI for now 14. Acute on chronic CKD III: BUN/SCr has normalized to 18/1.0  15. Thyroid nodules: Follow up with [facility] after discharge.  16.  Cecal wall thickening: Will need  to contact GI when nearing discharge to determine timing of colonoscopy.     I have personally performed a face to face diagnostic evaluation of this patient and formulated the key components of the plan.  Additionally, I have personally reviewed laboratory data, imaging studies, as well as relevant notes and concur with the physician assistant's documentation above.   The patient's status has not changed from the original H&P.  Any changes in documentation from the acute care chart have been noted above.     Bary Leriche, PA-C 10/02/2020

## 2020-10-02 NOTE — Progress Notes (Signed)
Courtney Heys, MD   Physician  Physical Medicine and Rehabilitation  PMR Pre-admission      Signed  Date of Service:  09/26/2020  1:45 PM       Related encounter: ED to Hosp-Admission (Discharged) from 09/12/2020 in [facility]       Signed      Show:Clear all _0 Written_1 Templated_2 Copied  Added by: _3 Cristina Gong, RN_4 Courtney Heys, MD  _5 Hover for details                                                                                                                                                                                                                                                                                                                                                                                                                                                    PMR Admission Coordinator Pre-Admission Assessment   Patient: [name] is an 55 y.o., female MRN: [id number] DOB: [birth year] Height: _6  (170.2 cm) Weight: 107 kg   Insurance Information HMO:     PPO:      PCP:      IPA:      80/20:      OTHER:  PRIMARY: Blue Medicare      Policy#: [id number]      Subscriber: pt CM Name: Larene Beach  Phone#: [phone]     Fax#: [phone] Pre-Cert#: TBD approved for 7 days       Employer:  Benefits:  Phone #: [phone]     Name: 9/23 Eff. Date: 01/05/2020     Deduct: none      Out of Pocket Max: $4200      Life Max: none CIR: $335 co pay per day days 1 until 6      SNF: no  copay days 1 until 20; $188 co apy per day days 21 until 60; no copay days 61 until 100 Outpatient: $40 per visit     Co-Pay: visits per medcial neccesity Home Health: 100%      Co-Pay: visits eper medcial neccesity DME: 80%     Co-Pay: 20% Providers: in network  SECONDARY:       Policy#:      Phone#:     Development worker, community:       Phone#:    The Engineer, petroleum" for patients in Inpatient Rehabilitation Facilities with attached "Privacy Act [place] Records" was provided and verbally reviewed with: Patient   Emergency Contact Information Contact Information       Name Relation Home Work Mobile    Glass,Patsy Mother [phone]   [phone]           Current Medical History  Patient Admitting Diagnosis: Debility   History of Present Illness: 55 year old female with medical history significant for ITP, type 2 DM, CKD, CHF, chronic hypoxic respiratory failure who presents on 09/12/2020 with SOB and bruising. Progressive over past month and had run out of lasix for one month. Hgb 4.5, platelets less than 5, WBC 2.8. CXR showed cardiomegaly with small pleural effusion and pulmonary edema. Treated in ED with IV lasix, protonix, dexamethasone and Lokelma. Transfused  PRBCs.   Presented with platelet of < 5. Patient received IV steroids, received Nplate and IVIG.    Received PRBC during hospitalization.Received Aranesp and Feraheme . Continue folic acid.  Patient has received pneumococcal meningococcal and haemophilus vaccination.  Oncology recommended splenectomy. Patient underwent splenectomy on 09/18/2020.  Platelets rising postoperatively as expected.  Due to persistent fever, CT abdomen and pelvis was repeated 09/23/2020 which showed regular expected postop changes however also showed irregular cecal wall thickening.  Abdominal pain and tenderness improving.  Management per general surgery.   Patient's fever has subsided now.  Leukocytosis stable..  S/p left-sided thoracentesis 09/22/2020 which seems to be exudative. Chest x-ray improved after that but repeat chest x-ray on the night of 09/23/2020 shows once again accumulation of the pleural effusion on the left side. CT chest obtained per IR recommendations which show some mild empyema on the left side.  Per IR,  it is too small to need a thoracentesis or chest tube.  Patient's blood culture is also growing MRSE.  For this reason, ID consulted.  Antibiotics were transitioned from  Zosyn to cefepime and vancomycin added on 09/23/2020 and Flagyl discontinued..  Pleural fluid culture negative.  Per ID, no need for repeat thoracentesis.  Completed 7 days of IV Vanc and Cefepime and started 3 weeks of Augmentin which will end 10/17. .CT abdomen shows cecal wall thickening.  GI consulted.  Plan for colonoscopy as outpatient.  Continue Toprol-XL.  Continue to hold rest of the antihypertensives which included Imdur, amlodipine.Patient was out of her Lasix at home. It appears that she was taking 160 mg of Lasix daily.  We resumed her on Lasix 40 mg p.o. twice  daily .  Her renal function is improving.SCr is 1.83 on admission, up from 1.51 in June 2022.     Patient's medical [facility] from [facility] has been reviewed by the rehabilitation admission coordinator and physician.   Past Medical History      Past Medical History:  Diagnosis Date   Acute exacerbation of CHF (congestive heart failure) ([place]) 04/11/2019   Anemia     Arthritis      "spine, hands" (11/25/2015)   Back pain      "all my back; probably 3 times/week" (11/25/2015)   Chronic indwelling Foley catheter 03/04/2017   Diabetic foot ulcer associated with type 2 diabetes mellitus ([place])     Hypertension     Intracerebral hemorrhage ([place]) As a teenager     States she had burst blood vessel as teenager, now with resultant minor visual field and hearing deficits   Neuropathy     Stroke ([place]) ~ 1982    "my feeling on my RLE, right lower eye vision,  & hearing out of my right ear not the same since" (11/25/2015)   Type II diabetes mellitus ([place])      Has the patient had major surgery during 100 days prior to admission? Yes   Family History   family history includes Cancer in her father; Diabetes in her brother, father, mother, and son; Hyperlipidemia  in her father and mother; Hypertension in her father and mother; Peripheral Artery Disease in her brother.   Current Medications   Current Facility-Administered Medications:    acetaminophen (TYLENOL) tablet 650 mg, 650 mg, Oral, Q4H PRN, Darliss Cheney, MD, 650 mg at 09/24/20 2111   amoxicillin-clavulanate (AUGMENTIN) 875-125 MG per tablet 1 tablet, 1 tablet, Oral, Q12H, Gonfa, Taye T, MD, 1 tablet at 10/02/20 0858   bethanechol (URECHOLINE) tablet 10 mg, 10 mg, Oral, TID, Maczis, Barth Kirks, PA-C, 10 mg at 10/02/20 0901   enoxaparin (LOVENOX) injection 50 mg, 50 mg, Subcutaneous, Q24H, Bertis Ruddy, RPH, 50 mg at 10/01/20 1304   fentaNYL (SUBLIMAZE) injection 25 mcg, 25 mcg, Intravenous, Q5 min PRN, Maryjane Hurter, MD   folic acid (FOLVITE) tablet 2 mg, 2 mg, Oral, Daily, Ennever, Rudell Cobb, MD, 2 mg at 10/02/20 0857   furosemide (LASIX) tablet 40 mg, 40 mg, Oral, BID, Pahwani, Ravi, MD, 40 mg at 10/02/20 0857   gabapentin (NEURONTIN) capsule 400 mg, 400 mg, Oral, BID, Pokhrel, Laxman, MD, 400 mg at 10/02/20 0857   HYDROmorphone (DILAUDID) injection 0.5-1 mg, 0.5-1 mg, Intravenous, Q3H PRN, Meuth, Brooke A, PA-C, 0.5 mg at 09/19/20 0931   insulin aspart (novoLOG) injection 0-15 Units, 0-15 Units, Subcutaneous, Q4H, Pahwani, Ravi, MD, 3 Units at 10/01/20 2111   isosorbide mononitrate (IMDUR) 24 hr tablet 30 mg, 30 mg, Oral, Daily, Gonfa, Taye T, MD, 30 mg at 10/02/20 0859   loperamide (IMODIUM) capsule 2 mg, 2 mg, Oral, PRN, Pokhrel, Laxman, MD, 2 mg at 09/17/20 2241   metoprolol succinate (TOPROL-XL) 24 hr tablet 50 mg, 50 mg, Oral, Daily, Gonfa, Taye T, MD, 50 mg at 10/02/20 0857   ondansetron (ZOFRAN) injection 4 mg, 4 mg, Intravenous, Q6H PRN, Opyd, Ilene Qua, MD, 4 mg at 09/28/20 0844   oxyCODONE (Oxy IR/ROXICODONE) immediate release tablet 5-10 mg, 5-10 mg, Oral, Q4H PRN, Meuth, Brooke A, PA-C, 10 mg at 09/26/20 2210   pantoprazole (PROTONIX) EC tablet 40 mg, 40 mg, Oral, Daily,  Pokhrel, Laxman, MD, 40 mg at 10/02/20 0858   polyvinyl alcohol (LIQUIFILM TEARS)  1.4 % ophthalmic solution 1 drop, 1 drop, Both Eyes, PRN, Pokhrel, Laxman, MD   rosuvastatin (CRESTOR) tablet 20 mg, 20 mg, Oral, Daily, Pokhrel, Laxman, MD, 20 mg at 10/02/20 0858   Patients Current Diet:  Diet Order                  Diet - low sodium heart healthy             Diet Carb Modified             Diet Carb Modified Fluid consistency: Thin; Room service appropriate? Yes  Diet effective now                       Precautions / Restrictions Precautions Precautions: Fall, Other (comment) Precaution Comments: L abdominal JP drain; R BKA (unable to don prosthesis due to swelling) Restrictions Weight Bearing Restrictions: No    Has the patient had 2 or more falls or a fall with injury in the past year? No   Prior Activity Level Limited Community (1-2x/wk): Mod I at wheelchair level   Prior Functional Level Self Care: Did the patient need help bathing, dressing, using the toilet or eating? Independent   Indoor Mobility: Did the patient need assistance with walking from room to room (with or without device)? Needed some help   Stairs: Did the patient need assistance with internal or external stairs (with or without device)? Needed some help   Functional Cognition: Did the patient need help planning regular tasks such as shopping or remembering to take medications? Independent   Patient Information Are you of Hispanic, Latino/a,or Spanish origin?: A. No, not of Hispanic, Latino/a, or Spanish origin What is your race?: B. Black or African American Do you need or want an interpreter to communicate with a doctor or health care staff?: 0. No   Patient's Response To:  Health Literacy and Transportation Is the patient able to respond to health literacy and transportation needs?: Yes Health Literacy - How often do you need to have someone help you when you read instructions, pamphlets, or  other written material from your doctor or pharmacy?: Never In the past 12 months, has lack of transportation kept you from medical appointments or from getting medications?: No In the past 12 months, has lack of transportation kept you from meetings, work, or from getting things needed for daily living?: No   Development worker, international aid / [place] Devices/Equipment: Wheelchair, Oxygen, CBG Meter, Bedside commode/3-in-1 Home Equipment: Bedside commode, Environmental consultant - 2 wheels, Wheelchair - manual, Hospital bed   Prior Device Use: Indicate devices/aids used by the patient prior to current illness, exacerbation or injury? Manual wheelchair and Orthotics/Prosthetics   Current Functional Level Cognition   Overall Cognitive Status: Impaired/Different from baseline Current Attention Level: Sustained Orientation Level: Oriented X4 Following Commands: Follows one step commands with increased time, Follows one step commands consistently, Follows multi-step commands inconsistently Safety/Judgement: Decreased awareness of safety, Decreased awareness of deficits General Comments: Pt able to maintain focus well, but needs repeated instructions and cues to perform tasks, even after performing them several minutes prior. Pt benefits from simple one-step commands, having difficulty following multi-step commands.    Extremity Assessment (includes Sensation/Coordination)   Upper Extremity Assessment: Defer to OT evaluation  Lower Extremity Assessment: Generalized weakness, RLE deficits/detail, LLE deficits/detail RLE Deficits / Details: R BKA hx, reports sensation deficits distally RLE Sensation: decreased light touch LLE Deficits / Details: Weakness noted with functional mobility LLE  Sensation: history of peripheral neuropathy LLE Coordination: decreased gross motor     ADLs   Overall ADL's : Needs assistance/impaired Eating/Feeding: Independent, Sitting Grooming: Wash/dry hands, Wash/dry face,  Set up, Sitting, Oral care Upper Body Bathing: Supervision/ safety, Sitting Lower Body Bathing: Minimal assistance, Bed level Upper Body Dressing : Set up, Sitting Lower Body Dressing: Moderate assistance, Sitting/lateral leans Toilet Transfer: Moderate assistance, Transfer board, +2 for safety/equipment, Requires wide/bariatric, Requires drop arm Functional mobility during ADLs: Moderate assistance, +2 for physical assistance General ADL Comments: +2 from lower surface     Mobility   Overal bed mobility: Needs Assistance Bed Mobility: Supine to Sit, Sit to Supine Rolling: Min assist Supine to sit: Supervision, HOB elevated Sit to supine: Supervision, HOB elevated General bed mobility comments: Increased time and effort, use of bed rails     Transfers   Overall transfer level: Needs assistance Equipment used:  (posterior aspect of recliner) Transfers: Sit to/from Stand, Lateral/Scoot Transfers Sit to Stand: From elevated surface, Mod assist Stand pivot transfers: Max assist, +2 physical assistance, +2 safety/equipment Squat pivot transfers: Mod assist Anterior-Posterior transfers: Min assist, From elevated surface  Lateral/Scoot Transfers: Min guard General transfer comment: Practiced sit <> stand from elevated EOB pulling up on posterior aspect of recliner, cuing pt to scoot anteriorly, rock anteriorly to gain momentum, and activate quads, encouraging increased buttocks clearance as reps progressed, x12 reps with modA to clear buttocks a couple inches but not obtaining full stand position each rep. Extra time and occasional blocking of L foot with lateral scoot to L towards HOB, x5 reps.     Ambulation / Gait / Stairs / Wheelchair Mobility   Ambulation/Gait General Gait Details: Unable     Posture / Balance Dynamic Sitting Balance Sitting balance - Comments: Dynamic sitting EOB without LOB, supervision for safety. Balance Overall balance assessment: Needs  assistance Sitting-balance support: No upper extremity supported, Feet supported Sitting balance-Leahy Scale: Good Sitting balance - Comments: Dynamic sitting EOB without LOB, supervision for safety. Standing balance support: Bilateral upper extremity supported Standing balance-Leahy Scale: Zero Standing balance comment: Unable to come to full upright standing from EOB today, focused on clearing buttocks only with modA.     Special needs/care consideration O 2 at 2 liters [place] Prosthetic to be brought in by family JP drain to be removed per patient on 9/29    Previous Home Environment  Living Arrangements:  (68 year old son, Mom in [place] and visits frequently)  Lives With: Son Available Help at Discharge: Family, Available PRN/intermittently Type of Home: House Home Layout: One level Home Access: Stairs to enter Entrance Stairs-Rails: None Entrance Stairs-Number of Steps: 1 Bathroom Shower/Tub: Other (comment) Bathroom Toilet: Handicapped height Bathroom Accessibility: Yes Home Care Services: No   Discharge Living Setting Plans for Discharge Living Setting: Patient's home, Lives with (comment) (31 year old son) Type of Home at Discharge: House Discharge Home Layout: One level Discharge Home Access: Stairs to enter Entrance Stairs-Rails: None Entrance Stairs-Number of Steps: 1 Discharge Bathroom Shower/Tub: Other (comment) (bathes w/c level at sink) Discharge Bathroom Toilet: Handicapped height Discharge Bathroom Accessibility: Yes How Accessible: Accessible via wheelchair Does the patient have any problems obtaining your medications?: No   Social/Family/Support Systems Patient Roles: Parent Contact Information: Mom Anticipated Caregiver: son Anticipated Caregiver's Contact Information: see above Ability/Limitations of Caregiver: Mom lives in [place] Caregiver Availability: Intermittent Discharge Plan Discussed with Primary Caregiver: Yes Is Caregiver In Agreement with Plan?:  Yes Does Caregiver/Family have Issues with Lodging/Transportation  while Pt is in Rehab?: No   Goals Patient/Family Goal for Rehab: Mod I to intermittent supervision at wheelchair level for PT and OT Expected length of stay: ELOS 10 to 14 days Pt/Family Agrees to Admission and willing to participate: Yes Program Orientation Provided & Reviewed with Pt/Caregiver Including Roles  & Responsibilities: Yes   Decrease burden of Care through IP rehab admission: n/a   Possible need for SNF placement upon discharge: not anticipated   Patient Condition: I have reviewed medical records from [facility], spoken with CM, and patient, son, and family member. I met with patient at the bedside for inpatient rehabilitation assessment.  Patient will benefit from ongoing PT and OT, can actively participate in 3 hours of therapy a day 5 days of the week, and can make measurable gains during the admission.  Patient will also benefit from the coordinated team approach during an Inpatient Acute Rehabilitation admission.  The patient will receive intensive therapy as well as Rehabilitation physician, nursing, social worker, and care management interventions.  Due to bladder management, bowel management, safety, skin/wound care, disease management, medication administration, pain management, and patient education the patient requires 24 hour a day rehabilitation nursing.  The patient is currently min to mod assist overall with mobility and basic ADLs.  Discharge setting and therapy post discharge at home with home health is anticipated.  Patient has agreed to participate in the Acute Inpatient Rehabilitation Program and will admit today.   Preadmission Screen Completed By:  Cleatrice Burke, 10/02/2020 11:34 AM ______________________________________________________________________   Discussed status with Dr. Dagoberto Ligas on  10/02/2020 at  1132 and received approval for admission today.   Admission Coordinator:   Cleatrice Burke, RN, time  [id number] Date  10/02/2020    Assessment/Plan: Diagnosis: Does the need for close, 24 hr/day Medical supervision in concert with the patient's rehab needs make it unreasonable for this patient to be served in a less intensive setting? Yes Co-Morbidities requiring supervision/potential complications: ITP s/p splenectomy, old BKA, DM, CHF, CKD  Due to bladder management, bowel management, safety, skin/wound care, disease management, medication administration, pain management, and patient education, does the patient require 24 hr/day rehab nursing? Yes Does the patient require coordinated care of a physician, rehab nurse, PT, OT, and SLP to address physical and functional deficits in the context of the above medical diagnosis(es)? Yes Addressing deficits in the following areas: balance, endurance, locomotion, strength, transferring, bowel/bladder control, bathing, dressing, feeding, grooming, and toileting Can the patient actively participate in an intensive therapy program of at least 3 hrs of therapy 5 days a week? Yes The potential for patient to make measurable gains while on inpatient rehab is good Anticipated functional outcomes upon discharge from inpatient rehab: modified independent and supervision PT, modified independent and supervision OT, n/a SLP Estimated rehab length of stay to reach the above functional goals is: 10-14 days Anticipated discharge destination: Home 10. Overall Rehab/Functional Prognosis: good     MD Signature:           Revision History                                    Note Details  Jan Fireman, MD File Time 10/02/2020 12:05 PM  Author Type Physician Status Signed  Last Editor Courtney Heys, MD Service Physical Medicine and [place] # [id number] Admit Date 10/02/2020

## 2020-10-02 NOTE — H&P (Signed)
Physical Medicine and Rehabilitation Admission H&P        Chief Complaint  Patient presents with   Functional deficits due to debility      HPI:  [name] is a 55 year old female with history of  HTN, T2DM, stroke, ICH, urinary retention-foley dependent, chronic hypoxic respiratory failure, R-BKA (CIR 2013), recurrent acute ITP (Dr. Lindi Adie) treated with IVIG/Nplate who was admitted on 09/12/20 with fatigue, Hgb 4.5, WBC 2.8, platelets <50,000 and AKI. Patient had not followed up with cancer center and run out of lasix X one months. Fluid overload treated with IV diuresis, 2 units PRBC and hyperkalemia treated with lokelma. She was treated with decadron, Nplate and IVIG. Elevated BS treated with insulin and decadron taper.  She was received aranesp and feraheme for supplement and splenectomy recommended by Dr. Marin Olp. She received triple vaccine-PNA/Haemophilus/meningococcus and was agreeable to undergo splenectomy on 09/15 by Dr. Barry Dienes.    Post op developed leucocytosis with fever up to 102.2 and was started ton Vanc/Cefepime/Flagyl for sepsis due to staph epi bacteremia with left pleural effusion. Pleural effusion tapped 09/19 for 500 cc fluid and cultures negative for infection.  She developed hypoxia with MS changes and CXR showed slight increase in left mid zone opacity. CT chest done 09/21 showing complex partially loculated pleural effusion with airspace consolidation in LLL concerning for PNA and assymetric enlargement of left thyroid lobe. CT abdomen was negative for infection but showed cecal wall thickening and Dr. Carlean Purl recommended colonoscopy for evaluation once medically appropriate or nearing discharge. Thyroid ultrasound showed two left thyroid masses with recommendations for FNA/surgical evaluation after discharge and to follow up with Dr. Verlee Monte for left effusion.     Dr. Gale Journey recommends IV antibiotics thorough 09/26 followed by 3 weeks of Augmentin given effusion.  JP  drain remains in place and small hematoma suspected adjacent to incision --JP pulled today. Leucocytosis resolving and felt to be steroid effect. She continues to be limited by weakness and has difficulty with standing. CIR recommended due to functional decline.    Pt reports her LBM was today- not SOB- not wearing O2 although sats running 86-92% when I was in room.  Using purewick-    Review of Systems  Constitutional:  Negative for chills and fever.  HENT:  Negative for hearing loss.   Eyes:  Negative for blurred vision.  Respiratory:  Negative for cough, shortness of breath and stridor.   Cardiovascular:  Positive for leg swelling. Negative for chest pain.  Gastrointestinal:  Negative for constipation, nausea and vomiting.  Genitourinary:  Negative for dysuria.  Musculoskeletal:  Negative for myalgias.  Neurological:  Positive for weakness. Negative for speech change and headaches.  Psychiatric/Behavioral:  The patient is not nervous/anxious.   All other systems reviewed and are negative.         Past Medical History:  Diagnosis Date   Acute exacerbation of CHF (congestive heart failure) ([place]) 04/11/2019   Anemia     Arthritis      "spine, hands" (11/25/2015)   Back pain      "all my back; probably 3 times/week" (11/25/2015)   Chronic indwelling Foley catheter 03/04/2017   Diabetic foot ulcer associated with type 2 diabetes mellitus ([place])     Hypertension     Intracerebral hemorrhage ([place]) As a teenager     States she had burst blood vessel as teenager, now with resultant minor visual field and hearing deficits   Neuropathy  Stroke [facility]) ~ 1982    "my feeling on my RLE, right lower eye vision,  & hearing out of my right ear not the same since" (11/25/2015)   Type II diabetes mellitus ([place])             Past Surgical History:  Procedure Laterality Date   AMPUTATION   11/24/2010    Procedure: AMPUTATION FOOT;  Surgeon: Wylene Simmer, MD;  Location: [place];  Service:  Orthopedics;  Laterality: Right;  Right  FOOT TRANS METATARSAL AMPUTATION   AMPUTATION   07/02/2011    Procedure: AMPUTATION BELOW KNEE;  Surgeon: Wylene Simmer, MD;  Location: [place];  Service: Orthopedics;  Laterality: Right;   APPLICATION OF WOUND VAC   07/02/2011    Procedure: APPLICATION OF WOUND VAC;  Surgeon: Wylene Simmer, MD;  Location: [place];  Service: Orthopedics;  Laterality: Right;   BRAIN SURGERY   1980's    Previous brain surgery in [place], [place]   2004; 2006   COLONOSCOPY N/A 09/17/2015    Procedure: COLONOSCOPY;  Surgeon: Wilford Corner, MD;  Location: [facility] ENDOSCOPY;  Service: Endoscopy;  Laterality: N/A;   ESOPHAGOGASTRODUODENOSCOPY N/A 09/17/2015    Procedure: ESOPHAGOGASTRODUODENOSCOPY (EGD);  Surgeon: Wilford Corner, MD;  Location: [facility] ENDOSCOPY;  Service: Endoscopy;  Laterality: N/A;   I & D EXTREMITY   11/24/2010    Procedure: IRRIGATION AND DEBRIDEMENT EXTREMITY;  Surgeon: Wylene Simmer, MD;  Location: [place];  Service: Orthopedics;  Laterality: Left;  Debriedment of left plantar ulcer and trimming of toenails   I & D EXTREMITY   07/02/2011    Procedure: IRRIGATION AND DEBRIDEMENT EXTREMITY;  Surgeon: Wylene Simmer, MD;  Location[facility] [place];  Service: Orthopedics;  Laterality: Left;  I & D of Left Foot   I & D EXTREMITY   07/06/2011    Procedure: IRRIGATION AND DEBRIDEMENT EXTREMITY;  Surgeon: Wylene Simmer, MD;  Location: [place];  Service: Orthopedics;  Laterality: Right;  IRRIGATION/DEBRIDEMENT RIGHT BELOW KNEE AMPUTATION   IR THORACENTESIS ASP PLEURAL SPACE W/IMG GUIDE   09/22/2020   LAPAROSCOPIC SPLENECTOMY N/A 09/18/2020    Procedure: HAND ASSISTED LAPAROSCOPIC SPLENECTOMY;  Surgeon: Stark Klein, MD;  Location: [place];  Service: General;  Laterality: N/A;  [place]   2006           Family History  Problem Relation Age of Onset   Diabetes Mother     Hypertension Mother     Hyperlipidemia Mother     Diabetes Father     Cancer Father           prostate   Hyperlipidemia Father     Hypertension Father     Diabetes Brother     Peripheral Artery Disease Brother          has had toes amputated   Diabetes Son        Social History: Lives wit 2 teenage children. She reports that she has never smoked. She has never used smokeless tobacco. She reports current alcohol use of about 2.0 standard drinks per week. She reports that she does not use drugs.     Allergies: No Known Allergies           Medications Prior to Admission  Medication Sig Dispense Refill   acetaminophen (TYLENOL) 325 MG tablet Take 2 tablets (650 mg total) by mouth every 4 (four) hours as needed for headache, mild pain or fever.  amoxicillin-clavulanate (AUGMENTIN) 875-125 MG tablet Take 1 tablet by mouth every 12 (twelve) hours for 18 days. 36 tablet 0   aspirin EC 81 MG tablet Take 1 tablet (81 mg total) by mouth daily. 30 tablet 11   bethanechol (URECHOLINE) 10 MG tablet Take 1 tablet (10 mg total) by mouth 3 (three) times daily for 10 days. 30 tablet 0   Blood Glucose Monitoring Suppl (PRODIGY AUTOCODE BLOOD GLUCOSE) w/Device KIT 1 kit by Does not apply route in the morning, at noon, in the evening, and at bedtime. 1 kit 0   ferrous gluconate (FERGON) 324 MG tablet Take 1 tablet (324 mg total) by mouth 2 (two) times daily with a meal. 30 tablet 3   [START ON 8/93/8101] folic acid (FOLVITE) 1 MG tablet Take 2 tablets (2 mg total) by mouth daily.       furosemide (LASIX) 40 MG tablet Take 1 tablet (40 mg total) by mouth 2 (two) times daily. 30 tablet     gabapentin (NEURONTIN) 400 MG capsule Take 1 capsule (400 mg total) by mouth 2 (two) times daily. 180 capsule 3   isosorbide mononitrate (IMDUR) 30 MG 24 hr tablet Take 1 tablet (30 mg total) by mouth daily. 90 tablet 3   loperamide (IMODIUM) 2 MG capsule Take 1 capsule (2 mg total) by mouth as needed for diarrhea or loose stools. 30 capsule 0   metFORMIN (GLUCOPHAGE) 500 MG tablet Take 500 mg by mouth 2 (two)  times daily.       metoprolol succinate (TOPROL-XL) 50 MG 24 hr tablet Take 1 tablet (50 mg total) by mouth daily. Take with or immediately following a meal. 90 tablet 3   [START ON 10/03/2020] pantoprazole (PROTONIX) 40 MG tablet Take 1 tablet (40 mg total) by mouth daily.       polyvinyl alcohol (LIQUIFILM TEARS) 1.4 % ophthalmic solution Place 1 drop into both eyes as needed for dry eyes. (Patient not taking: No sig reported)       rosuvastatin (CRESTOR) 20 MG tablet Take 1 tablet (20 mg total) by mouth daily. 90 tablet 3      Drug Regimen Review  Drug regimen was reviewed and remains appropriate with no significant issues identified   Home: Home Living Family/patient expects to be discharged to:: Private residence Living Arrangements:  (41 year old son, Mom in [place] and visits frequently) Available Help at Discharge: Family, Available PRN/intermittently Type of Home: House Home Access: Stairs to enter Technical brewer of Steps: 1 Entrance Stairs-Rails: None Home Layout: One level Bathroom Shower/Tub: Other (comment) Bathroom Toilet: Handicapped height Bathroom Accessibility: Yes Home Equipment: Bedside commode, [place] - 2 wheels, Wheelchair - manual, Hospital bed  Lives With: Son   Functional History: Prior Function Level of Independence: Needs assistance Gait / Transfers Assistance Needed: uses w/c primarily because she can't get prosthesis on due to swelling ADL's / Homemaking Assistance Needed: son helps with transfers as of recent onto commode, bath and dresses self, son drives and grocery shops   Functional Status:  Mobility: Bed Mobility Overal bed mobility: Needs Assistance Bed Mobility: Supine to Sit, Sit to Supine Rolling: Min assist Supine to sit: Supervision, HOB elevated Sit to supine: Supervision, HOB elevated General bed mobility comments: Increased time and effort, use of bed rails Transfers Overall transfer level: Needs assistance Equipment used:   (posterior aspect of recliner) Transfers: Sit to/from Stand, Lateral/Scoot Transfers Sit to Stand: From elevated surface, Mod assist Stand pivot transfers: Max assist, +2 physical assistance, +  2 safety/equipment Squat pivot transfers: Mod assist Anterior-Posterior transfers: Min assist, From elevated surface  Lateral/Scoot Transfers: Min guard General transfer comment: Practiced sit <> stand from elevated EOB pulling up on posterior aspect of recliner, cuing pt to scoot anteriorly, rock anteriorly to gain momentum, and activate quads, encouraging increased buttocks clearance as reps progressed, x12 reps with modA to clear buttocks a couple inches but not obtaining full stand position each rep. Extra time and occasional blocking of L foot with lateral scoot to L towards HOB, x5 reps. Ambulation/Gait General Gait Details: Unable   ADL: ADL Overall ADL's : Needs assistance/impaired Eating/Feeding: Independent, Sitting Grooming: Wash/dry hands, Wash/dry face, Set up, Sitting, Oral care Upper Body Bathing: Supervision/ safety, Sitting Lower Body Bathing: Minimal assistance, Bed level Upper Body Dressing : Set up, Sitting Lower Body Dressing: Moderate assistance, Sitting/lateral leans Toilet Transfer: Moderate assistance, Transfer board, +2 for safety/equipment, Requires wide/bariatric, Requires drop arm Functional mobility during ADLs: Moderate assistance, +2 for physical assistance General ADL Comments: +2 from lower surface   Cognition: Cognition Overall Cognitive Status: Impaired/Different from baseline Orientation Level: Oriented X4 Cognition Arousal/Alertness: Awake/alert Behavior During Therapy: WFL for tasks assessed/performed Overall Cognitive Status: Impaired/Different from baseline Area of Impairment: Safety/judgement, Awareness, Following commands, Memory, Problem solving Current Attention Level: Sustained Memory: Decreased short-term memory Following Commands: Follows one  step commands with increased time, Follows one step commands consistently, Follows multi-step commands inconsistently Safety/Judgement: Decreased awareness of safety, Decreased awareness of deficits Awareness: Emergent Problem Solving: Slow processing, Decreased initiation, Difficulty sequencing, Requires verbal cues, Requires tactile cues General Comments: Pt able to maintain focus well, but needs repeated instructions and cues to perform tasks, even after performing them several minutes prior. Pt benefits from simple one-step commands, having difficulty following multi-step commands.     Blood pressure (!) 170/73, pulse 81, temperature 98.4 F (36.9 C), temperature source Oral, resp. rate 18, height _0  (1.702 m), weight 107 kg, SpO2 97 %. Physical Exam Vitals and nursing note reviewed.  Constitutional:      Appearance: Normal appearance.     Comments: Sitting up in bed- O2 on lap; eating lunch; NAD  HENT:     Head: Normocephalic and atraumatic.     Right Ear: External ear normal.     Left Ear: External ear normal.     Nose: Nose normal. No congestion.     Mouth/Throat:     Mouth: Mucous membranes are moist.     Pharynx: Oropharynx is clear. No oropharyngeal exudate.  Eyes:     General:        Right eye: No discharge.        Left eye: No discharge.     Extraocular Movements: Extraocular movements intact.  Cardiovascular:     Rate and Rhythm: Normal rate and regular rhythm.     Heart sounds: Normal heart sounds. No murmur heard.   No gallop.  Pulmonary:     Effort: Pulmonary effort is normal.     Breath sounds: Normal breath sounds.     Comments: CTA B/L- good air movement- O2 sats 86% to 92% on RA Abdominal:     General: There is distension (LLQ with edema extending to left upper thigh).     Comments: LUQ wound with dry dressing. - serous drainage- not through dressing Soft, NT, ND, (+)BS  Genitourinary:    Comments: Purewick in place Musculoskeletal:     Cervical back:  Normal range of motion and neck supple.     Comments:  Ues- 4+/5 except 4/5 in FA B/L Les; RLE- 4+/5 in HF/KE/KF- old R BKA LLE- HF 4-/5, distally 4+/5     Skin:    Comments: Multiple healing lesions on left shin?  And healing wound on R popliteal fossa- very superficial- no drainage.  Mild hand swelling L>R  Neurological:     Mental Status: She is alert and oriented to person, place, and time.     Comments: Speech clear. Able to follow commands without difficulty.  Intact to light touch in Ue's B/L and decreased below knee on LLE  Psychiatric:        Mood and Affect: Mood normal.        Behavior: Behavior normal.      Lab Results Last 48 Hours        Results for orders placed or performed during the hospital encounter of 09/12/20 (from the past 48 hour(s))  Glucose, capillary     Status: Abnormal    Collection Time: 09/30/20  7:58 PM  Result Value Ref Range    Glucose-Capillary 140 (H) 70 - 99 mg/dL      Comment: Glucose reference range applies only to samples taken after fasting for at least 8 hours.    Comment 1 Notify RN      Comment 2 Document in Chart    Glucose, capillary     Status: Abnormal    Collection Time: 10/01/20 12:06 AM  Result Value Ref Range    Glucose-Capillary 126 (H) 70 - 99 mg/dL      Comment: Glucose reference range applies only to samples taken after fasting for at least 8 hours.    Comment 1 Notify RN      Comment 2 Document in Chart    Renal function panel     Status: Abnormal    Collection Time: 10/01/20  3:44 AM  Result Value Ref Range    Sodium 136 135 - 145 mmol/L    Potassium 3.8 3.5 - 5.1 mmol/L    Chloride 103 98 - 111 mmol/L    CO2 27 22 - 32 mmol/L    Glucose, Bld 92 70 - 99 mg/dL      Comment: Glucose reference range applies only to samples taken after fasting for at least 8 hours.    BUN 18 6 - 20 mg/dL    Creatinine, Ser 1.00 0.44 - 1.00 mg/dL    Calcium 8.3 (L) 8.9 - 10.3 mg/dL    Phosphorus 2.9 2.5 - 4.6 mg/dL    Albumin 2.2 (L)  3.5 - 5.0 g/dL    GFR, Estimated >60 >60 mL/min      Comment: (NOTE) Calculated using the CKD-EPI Creatinine Equation (2021)      Anion gap 6 5 - 15      Comment: Performed at [facility] [address], [place], [place] [address]  Magnesium     Status: None    Collection Time: 10/01/20  3:44 AM  Result Value Ref Range    Magnesium 1.9 1.7 - 2.4 mg/dL      Comment: Performed at [facility] [address], [place] [address]  CBC     Status: Abnormal    Collection Time: 10/01/20  3:44 AM  Result Value Ref Range    WBC 13.9 (H) 4.0 - 10.5 K/uL    RBC 3.25 (L) 3.87 - 5.11 MIL/uL    Hemoglobin 8.0 (L) 12.0 - 15.0 g/dL    HCT 27.7 (L)  36.0 - 46.0 %    MCV 85.2 80.0 - 100.0 fL    MCH 24.6 (L) 26.0 - 34.0 pg    MCHC 28.9 (L) 30.0 - 36.0 g/dL    RDW 24.7 (H) 11.5 - 15.5 %    Platelets 870 (H) 150 - 400 K/uL    nRBC 0.1 0.0 - 0.2 %      Comment: Performed at [facility] [address] [place], [place] [address]  Brain natriuretic peptide     Status: Abnormal    Collection Time: 10/01/20  3:44 AM  Result Value Ref Range    B Natriuretic Peptide 619.5 (H) 0.0 - 100.0 pg/mL      Comment: Performed at [facility] [address] [place], [place] [address]  Glucose, capillary     Status: Abnormal    Collection Time: 10/01/20  4:00 AM  Result Value Ref Range    Glucose-Capillary 110 (H) 70 - 99 mg/dL      Comment: Glucose reference range applies only to samples taken after fasting for at least 8 hours.    Comment 1 Notify RN      Comment 2 Document in Chart    Glucose, capillary     Status: Abnormal    Collection Time: 10/01/20  6:53 AM  Result Value Ref Range    Glucose-Capillary 109 (H) 70 - 99 mg/dL      Comment: Glucose reference range applies only to samples taken after fasting for at least 8 hours.  Glucose, capillary     Status: Abnormal    Collection Time: 10/01/20 11:37 AM  Result Value Ref Range    Glucose-Capillary 151 (H) 70 - 99  mg/dL      Comment: Glucose reference range applies only to samples taken after fasting for at least 8 hours.  Glucose, capillary     Status: Abnormal    Collection Time: 10/01/20  4:23 PM  Result Value Ref Range    Glucose-Capillary 153 (H) 70 - 99 mg/dL      Comment: Glucose reference range applies only to samples taken after fasting for at least 8 hours.  Glucose, capillary     Status: Abnormal    Collection Time: 10/01/20  8:07 PM  Result Value Ref Range    Glucose-Capillary 151 (H) 70 - 99 mg/dL      Comment: Glucose reference range applies only to samples taken after fasting for at least 8 hours.    Comment 1 Notify RN      Comment 2 Document in Chart    Glucose, capillary     Status: Abnormal    Collection Time: 10/02/20 12:05 AM  Result Value Ref Range    Glucose-Capillary 110 (H) 70 - 99 mg/dL      Comment: Glucose reference range applies only to samples taken after fasting for at least 8 hours.    Comment 1 Notify RN      Comment 2 Document in Chart    Glucose, capillary     Status: Abnormal    Collection Time: 10/02/20  4:19 AM  Result Value Ref Range    Glucose-Capillary 107 (H) 70 - 99 mg/dL      Comment: Glucose reference range applies only to samples taken after fasting for at least 8 hours.    Comment 1 Notify RN      Comment 2 Document in Chart    CBC     Status: Abnormal  Collection Time: 10/02/20  4:57 AM  Result Value Ref Range    WBC 13.3 (H) 4.0 - 10.5 K/uL    RBC 3.38 (L) 3.87 - 5.11 MIL/uL    Hemoglobin 8.5 (L) 12.0 - 15.0 g/dL    HCT 29.7 (L) 36.0 - 46.0 %    MCV 87.9 80.0 - 100.0 fL    MCH 25.1 (L) 26.0 - 34.0 pg    MCHC 28.6 (L) 30.0 - 36.0 g/dL    RDW 24.3 (H) 11.5 - 15.5 %    Platelets 892 (H) 150 - 400 K/uL      Comment: REPEATED TO VERIFY    nRBC 0.0 0.0 - 0.2 %      Comment: Performed at [facility], [place] [address] [place], [place] [address]  Brain natriuretic peptide     Status: Abnormal    Collection Time: 10/02/20  4:57 AM   Result Value Ref Range    B Natriuretic Peptide 569.0 (H) 0.0 - 100.0 pg/mL      Comment: Performed at [facility] [address], [place], [place] [address]  Glucose, capillary     Status: Abnormal    Collection Time: 10/02/20  8:24 AM  Result Value Ref Range    Glucose-Capillary 102 (H) 70 - 99 mg/dL      Comment: Glucose reference range applies only to samples taken after fasting for at least 8 hours.  Glucose, capillary     Status: Abnormal    Collection Time: 10/02/20 11:54 AM  Result Value Ref Range    Glucose-Capillary 131 (H) 70 - 99 mg/dL      Comment: Glucose reference range applies only to samples taken after fasting for at least 8 hours.      Imaging Results (Last 48 hours)  No results found.           Medical Problem List and Plan: 1.  Debility secondary to ITP/s/p splenectomy and old R BKA- hasn't worn her new prosthesis due to fitting issues             -patient may  shower if cover dressing             -ELOS/Goals: 10-14 days- mod I to supervision 2.  Antithrombotics: -DVT/anticoagulation:  Pharmaceutical: Lovenox             -antiplatelet therapy: N/A 3. Pain Management: Oxycodone prn.  4. Mood: LCSW to follow for evaluation and support.              -antipsychotic agents: N/A 5. Neuropsych: This patient is capable of making decisions on her own behalf. 6. Skin/Wound Care: Monitor wound for healing.   7. Fluids/Electrolytes/Nutrition: Monitor I/O. Check CMET in am.  8. ITP/pancytopenia/anemia:Improving with Hgb-8.5, WBC 13.3 and platelets -892             --recheck CBC in am.  9. MRSE bacteremia/HAP: IV antibiotics transitioned to Augmentin thru 10/20/20 10. Left pleural effusion s/p thoracotomy 9/19: To follow up with pulmonary after discharge.                   --encourage pulmonary hygiene with flutter valve.  11. Acute on chronic CHF: Lasix decreased to 40 mg bid. Amlodipine d/c.              --monitor I/O. Check weights daily. Monitor for signs  of overload.             --continue Toprol XL, Lasix,  12. HTN: Monitor BP TID--on Toprol, Lasix, Imdur.  13. T2DM: Hgb A1C- 5.5 and well controlled on metformin BID PTA             --on hold due to AKI. Will continue to monitor BS ac/hs and use SSI for now 14. Acute on chronic CKD III: BUN/SCr has normalized to 18/1.0  15. Thyroid nodules: Follow up with [facility] after discharge.  16.  Cecal wall thickening: Will need to contact GI when nearing discharge to determine timing of colonoscopy.        I have personally performed a face to face diagnostic evaluation of this patient and formulated the key components of the plan.  Additionally, I have personally reviewed laboratory data, imaging studies, as well as relevant notes and concur with the physician assistant's documentation above.   The patient's status has not changed from the original H&P.  Any changes in documentation from the acute care chart have been noted above.       Bary Leriche, PA-C 10/02/2020

## 2020-10-02 NOTE — Progress Notes (Signed)
14 Days Post-Op   Chief Complaint/Subjective: Eating well, no abdominal pain  Review of Systems See above, otherwise other systems negative   PMH -  has a past medical history of Acute exacerbation of CHF (congestive heart failure) (HCC) (04/11/2019), Anemia, Arthritis, Back pain, Chronic indwelling Foley catheter (03/04/2017), Diabetic foot ulcer associated with type 2 diabetes mellitus (HCC), Hypertension, Intracerebral hemorrhage (HCC) (As a teenager ), Neuropathy, Stroke (HCC) (~ 1982), and Type II diabetes mellitus (HCC). PSH -  has a past surgical history that includes Cesarean section (2004; 2006); Amputation (11/24/2010); I & D extremity (11/24/2010); Amputation (07/02/2011); Application if wound vac (07/02/2011); I & D extremity (07/02/2011); I & D extremity (07/06/2011); Brain surgery (1980's); Esophagogastroduodenoscopy (N/A, 09/17/2015); Colonoscopy (N/A, 09/17/2015); Tubal ligation (2006); laparoscopic splenectomy (N/A, 09/18/2020); and IR THORACENTESIS ASP PLEURAL SPACE W/IMG GUIDE (09/22/2020).  [facility] - family history includes Cancer in her father; Diabetes in her brother, father, mother, and son; Hyperlipidemia in her father and mother; Hypertension in her father and mother; Peripheral Artery Disease in her brother.   Objective: Vital signs in last 24 hours: Temp:  [98 F (36.7 C)-98.8 F (37.1 C)] 98.4 F (36.9 C) (09/29 0826) Pulse Rate:  [80-85] 81 (09/29 0420) Resp:  [18-20] 18 (09/29 0420) BP: (147-170)/(68-90) 170/73 (09/29 0420) SpO2:  [96 %-97 %] 97 % (09/29 0420) Weight:  [696 kg] 107 kg (09/29 0420) Last BM Date: 10/01/20 Intake/Output from previous day: 09/28 0701 - 09/29 0700 In: 720 [P.O.:720] Out: 307.5 [Urine:300; Drains:7.5] Intake/Output this shift: No intake/output data recorded.  PE: Gen: NAD Resp: nonlabored Card: RRR Abd: soft, incisions c/d/I, minimal output from drain  Lab Results:  Recent Labs    10/01/20 0344 10/02/20 0457  WBC 13.9* 13.3*   HGB 8.0* 8.5*  HCT 27.7* 29.7*  PLT 870* 892*   BMET Recent Labs    09/30/20 0458 10/01/20 0344  NA 137 136  K 3.8 3.8  CL 104 103  CO2 25 27  GLUCOSE 100* 92  BUN 19 18  CREATININE 0.98 1.00  CALCIUM 8.5* 8.3*   PT/INR No results for input(s): LABPROT, INR in the last 72 hours. CMP     Component Value Date/Time   NA 136 10/01/2020 0344   NA 139 05/16/2019 1522   NA 138 09/30/2015 1118   K 3.8 10/01/2020 0344   K 4.6 09/30/2015 1118   CL 103 10/01/2020 0344   CO2 27 10/01/2020 0344   CO2 19 (L) 09/30/2015 1118   GLUCOSE 92 10/01/2020 0344   GLUCOSE 200 (H) 09/30/2015 1118   BUN 18 10/01/2020 0344   BUN 23 05/16/2019 1522   BUN 22.3 09/30/2015 1118   CREATININE 1.00 10/01/2020 0344   CREATININE 2.63 (H) 06/12/2020 0755   CREATININE 1.3 (H) 09/30/2015 1118   CALCIUM 8.3 (L) 10/01/2020 0344   CALCIUM 9.6 09/30/2015 1118   PROT 6.0 (L) 09/25/2020 0358   PROT 8.2 05/16/2019 1522   ALBUMIN 2.2 (L) 10/01/2020 0344   ALBUMIN 4.2 05/16/2019 1522   AST 17 09/25/2020 0358   AST 15 06/12/2020 0755   ALT 13 09/25/2020 0358   ALT 9 06/12/2020 0755   ALKPHOS 74 09/25/2020 0358   BILITOT 0.7 09/25/2020 0358   BILITOT 0.3 06/12/2020 0755   GFRNONAA >60 10/01/2020 0344   GFRNONAA 21 (L) 06/12/2020 0755   GFRAA 59 (L) 09/19/2019 1449   Lipase     Component Value Date/Time   LIPASE 23 06/26/2019 0124    Studies/Results: No results  found.  Anti-infectives: Anti-infectives (From admission, onward)    Start     Dose/Rate Route Frequency Ordered Stop   09/29/20 1800  amoxicillin-clavulanate (AUGMENTIN) 875-125 MG per tablet 1 tablet        1 tablet Oral Every 12 hours 09/29/20 0728 10/20/20 2159   09/27/20 1045  vancomycin (VANCOREADY) IVPB 1750 mg/350 mL  Status:  Discontinued        1,750 mg 175 mL/hr over 120 Minutes Intravenous Every 24 hours 09/27/20 1040 09/27/20 1043   09/27/20 1045  vancomycin (VANCOREADY) IVPB 1250 mg/250 mL        1,250 mg 166.7 mL/hr  over 90 Minutes Intravenous Every 24 hours 09/27/20 1043 09/29/20 2031   09/25/20 1200  vancomycin (VANCOREADY) IVPB 1750 mg/350 mL  Status:  Discontinued        1,750 mg 175 mL/hr over 120 Minutes Intravenous Every 48 hours 09/23/20 1056 09/27/20 1040   09/23/20 1400  ceFEPIme (MAXIPIME) 2 g in sodium chloride 0.9 % 100 mL IVPB        2 g 200 mL/hr over 30 Minutes Intravenous Every 12 hours 09/23/20 1047 09/29/20 1431   09/23/20 1400  metroNIDAZOLE (FLAGYL) tablet 500 mg  Status:  Discontinued        500 mg Oral Every 12 hours 09/23/20 1047 09/24/20 1028   09/23/20 1200  vancomycin (VANCOREADY) IVPB 2000 mg/400 mL        2,000 mg 200 mL/hr over 120 Minutes Intravenous  Once 09/23/20 1047 09/23/20 1326   09/21/20 1400  piperacillin-tazobactam (ZOSYN) IVPB 3.375 g  Status:  Discontinued        3.375 g 12.5 mL/hr over 240 Minutes Intravenous Every 8 hours 09/21/20 1122 09/23/20 1047   09/18/20 1115  cefoTEtan (CEFOTAN) 2 g in sodium chloride 0.9 % 100 mL IVPB        2 g 200 mL/hr over 30 Minutes Intravenous  Once 09/18/20 1112 09/18/20 1250   09/18/20 1114  sodium chloride 0.9 % with cefoTEtan (CEFOTAN) ADS Med       Note to Pharmacy: Janene Harvey   : cabinet override      09/18/20 1114 09/18/20 1253       Assessment/Plan  s/p Procedure(s): HAND ASSISTED LAPAROSCOPIC SPLENECTOMY 09/18/2020  -remove drain    FEN - CM diet VTE - SCD, Lovenox  ID - Vanc/Cefepime until 9/26 per ID, then PO augmentin   LOS: 20 days   [facility] 10/02/2020, 8:59 AM Please see Amion for pager number during day hours 7:00am-4:30pm or 7:00am -11:30am on weekends

## 2020-10-02 NOTE — Progress Notes (Signed)
Inpatient Rehabilitation Admissions Coordinator   CIR bed is available to admit patient today. I have alerted acute team, patient and TOC. I will make the arrangements to admit today.  Ottie Glazier, RN, MSN Rehab Admissions Coordinator [phone] 10/02/2020 10:27 AM

## 2020-10-02 NOTE — Progress Notes (Signed)
Patient had nasal cannula resting on lap, she stated she only uses as needed and the DR. Is wanting to wean her off since she does not use it at home. Patient oxygen saturation 79%  on room air. Patient stated she was feeling a little dizzy. Placed Oxygen on via nasal cannula at 2L /min. Sat 93% with oxygen. There was no order for oxygen at 2/L, contacted PA Dan verbal order for 2 L/min keeping sats above 90%. Patient stated she was feeling better no other concerns to report at this time.

## 2020-10-02 NOTE — Discharge Summary (Signed)
Physician Discharge Summary  [name] KGM:[id number] DOB: 07-19-1965 DOA: 09/12/2020  PCP: Clovia Cuff, MD  Admit date: 09/12/2020 Discharge date: 10/02/2020 Admitted From: Home Disposition: CIR Recommendations for Outpatient Follow-up:  Follow up with general surgery, pulmonology, hematology, gastroenterology Repeat TSH and free T4 in 4 to 6 weeks.  She may need FNA for thyroid nodule Please obtain CBC/CMP/Mag at follow up Consider Farxiga for diabetes once she completes treatment for infection Please follow up on the following pending results: Fluid amylase level  Discharge Condition: Stable CODE STATUS: Full code  Follow-up Information     Nicholas Lose, MD. Call in 1 week(s).   Specialty: Hematology and Oncology Why: ITP followup Contact information: [place] [address] [phone]         Stark Klein, MD. Schedule an appointment as soon as possible for a visit in 2 week(s).   Specialty: General Surgery Why: Call and schedule a post-operative follow up appointment for about 2 weeks after you are discharged from inpatient rehab Contact information: [place] [address] [phone]                Hospital Course: 55 year old F with PMH of ITP, DM-2, CKD-3, diastolic CHF, ICH, PVD, right BKA, neurogenic bladder/chronic Foley presenting with shortness of breath, fatigue, physical deconditioning and bruising for about a month, and admitted for ITP/pancytopenia with Hgb of 4.5, platelet <5 and WBC 2.8, and acute on chronic diastolic CHF.  Heme-onc consulted and she was treated with blood transfusion, IVIG, EPO, IV iron, Nplate, steroids and splenectomy with improvement in her pancytopenia.  General surgery and hematology to arrange outpatient follow-up.  Hospital course noteworthy of sepsis/acute hypoxic RF in the setting of HAP/exudative loculated left pleural effusion/MRSE bacteremia.  Completed  7 days of IV Vanc and Cefepime and started  3 weeks of Augmentin which will 10/17.  Respiratory failure resolved.  Liberated off oxygen.  Pulmonology to arrange outpatient follow-up.  Patient was also diuresed with IV Lasix for CHF exacerbation and transitioned to oral Lasix and continue to diurese well.  Gastroenterology was consulted for CT finding concerning for cecal wall thickening.  They will arrange outpatient follow-up for colonoscopy.   Stable to discharge to CIR once bed available.    See individual problem list below for more on hospital course.  Discharge Diagnoses:  ITP/pancytopenia/symptomatic anemia-resolved except for anemia.  Hgb stable between 8.0 and 8.5. -Hgb of 4.5, platelet <5 and WBC 2.8 -S/p  IVIG, Nplate, steroids, 1 unit of PRBC and splenectomy with appropriate vaccines on 9/15 -Follow on fluid amylase levels to exclude pancreatic leak.  -Outpatient follow-up with general surgery and hematology as above -Recheck CBC in about 1 week   Sepsis due to HAP/large exudative left pleural effusion/MRSE bacteremia-sepsis physiology resolved except for leukocytosis which is improving. -Zosyn>> cefepime, Flagyl and vancomycin> 9/26> p.o. Augmentin >>> 10/20/20 -Outpatient follow-up with pulmonology -See below for pleural effusion.   Exudative left pleural effusion-s/p left-Thora on 9/19 with removal of 500 cc.  Cultures negative.  Subsequent CT on 9/21 with partially loculated left pleural effusion with associated areas of atelectasis in the lungs.  Now with improved aeration and LLL.  Hypoxemia resolved.   -Antibiotics as above.  -9/25-CVTS, Dr. Kipp Brood recommended pulmonary consult -Pulmonology to arrange outpatient follow-up   Acute respiratory failure with hypoxia-multifactorial as above.  Carries history of chronic hypoxic RF but she says she never used oxygen.  Respiratory failure resolved. -Encourage IS/OOB/PT/OT -  Antibiotics as above   Acute on chronic  diastolic CHF: reportedly out of her Lasix for 1 month POA.  It appears that she was taking 160 mg of Lasix daily at home.  On p.o. Lasix 40 mg twice daily here.  About 1.3 L UOP/24 hours.  Net 13.5 L so far..  Weight down 10 pounds.  Creatinine and BNP improved.  Still with trace edema. -Continue Lasix 40 mg twice daily-stable on this. -Discontinued amlodipine given edema and CHF. -Monitor fluid status, renal functions and electrolytes   Cecal wall thickening: Noted on CT abdomen and pelvis.  GI consulted.   -Colonoscopy once she is more stable.  GI to arrange, likely outpatient   Essential hypertension: SBP in 150s. -Increase Toprol-XL to 50 mg daily (home dose). -Continue home Imdur. -Lasix as above. -Discontinued amlodipine   AKI on CKD IIIB/obstructive uropathy-resolved. Recent Labs (within last 365 days)              Recent Labs    09/23/20 0939 09/23/20 2008 09/24/20 0342 09/25/20 0358 09/26/20 0227 09/27/20 0336 09/28/20 0334 09/29/20 0217 09/30/20 0458 10/01/20 0344  BUN 45* 44* 41* 36* 32* 30* 29* 25* 19 18  CREATININE 1.99* 2.14* 2.06* 1.71* 1.47* 1.42* 1.31* 1.21* 0.98 1.00    -Continue monitoring   Controlled DM-2 with hyperglycemia: A1c 5.5% but after PRBC.  Was 6.2% in 06/2020. Recent Labs  Lab 10/01/20 1623 10/01/20 2007 10/02/20 0005 10/02/20 0419 10/02/20 0824  GLUCAP 153* 151* 110* 107* 102*  -Continue home metformin and a statin   Debility/physical deconditioning/right BKA: Has a wheelchair at home. -Continue therapy at CIR.   Hyponatremia: Resolved.   Hypomagnesemia: Resolved.   Leukocytosis/thrombocytosis: Leukocytosis started to improve.  Thrombocytosis remains elevated likely from splenectomy. -Management as above.   Left thyroid nodule: 5.7 cm.  TSH 6.8.  -Recheck TSH and free T4 in 4 to 6 weeks -May need FNA outpatient.   Morbid obesity-somewhat underestimated in the setting of BKA Body mass index is 36.95 kg/m.            Discharge Exam: Vitals:   10/01/20 1622 10/01/20 2008 10/02/20 0420 10/02/20 0826  BP: (!) 154/68 (!) 147/90 (!) 170/73   Pulse: 80 85 81   Temp: 98 F (36.7 C) 98.8 F (37.1 C) 98.5 F (36.9 C) 98.4 F (36.9 C)  Resp: _0 Height:      Weight:   107 kg   SpO2: 96% 96% 97%   TempSrc: Oral Oral Oral Oral  BMI (Calculated):   36.94      GENERAL: No apparent distress.  Nontoxic. HEENT: MMM.  Vision and hearing grossly intact.  NECK: Supple.  No apparent JVD.  RESP: 94 to 97% on RA.  No IWOB.  Improved aeration over LLE. CVS:  RRR. Heart sounds normal.  ABD/GI/GU: Bowel sounds present. Soft. Non tender.  MSK/EXT:  Moves extremities.  Right BKA.  Trace edema in LLE. SKIN: no apparent skin lesion or wound NEURO: Awake and alert.  Oriented appropriately.  No apparent focal neuro deficit. PSYCH: Calm. Normal affect.   Discharge Instructions  Discharge Instructions     Diet - low sodium heart healthy   Complete by: As directed    With <  1500 cc fluid a day and < 2 g sodium a day   Diet Carb Modified   Complete by: As directed    Increase activity slowly   Complete by: As directed    No  wound care   Complete by: As directed       Allergies as of 10/02/2020   No Known Allergies      Medication List     STOP taking these medications    amLODipine 5 MG tablet Commonly known as: NORVASC       TAKE these medications    acetaminophen 325 MG tablet Commonly known as: TYLENOL Take 2 tablets (650 mg total) by mouth every 4 (four) hours as needed for headache, mild pain or fever.   amoxicillin-clavulanate 875-125 MG tablet Commonly known as: AUGMENTIN Take 1 tablet by mouth every 12 (twelve) hours for 18 days.   aspirin EC 81 MG tablet Take 1 tablet (81 mg total) by mouth daily. What changed:  when to take this reasons to take this additional instructions   bethanechol 10 MG tablet Commonly known as: URECHOLINE Take 1 tablet (10 mg total) by mouth  3 (three) times daily for 10 days.   ferrous gluconate 324 MG tablet Commonly known as: FERGON Take 1 tablet (324 mg total) by mouth 2 (two) times daily with a meal. What changed: when to take this   folic acid 1 MG tablet Commonly known as: FOLVITE Take 2 tablets (2 mg total) by mouth daily. Start taking on: October 03, 2020   furosemide 40 MG tablet Commonly known as: LASIX Take 1 tablet (40 mg total) by mouth 2 (two) times daily. What changed:  how much to take when to take this   gabapentin 400 MG capsule Commonly known as: NEURONTIN Take 1 capsule (400 mg total) by mouth 2 (two) times daily.   isosorbide mononitrate 30 MG 24 hr tablet Commonly known as: IMDUR Take 1 tablet (30 mg total) by mouth daily.   loperamide 2 MG capsule Commonly known as: IMODIUM Take 1 capsule (2 mg total) by mouth as needed for diarrhea or loose stools.   metFORMIN 500 MG tablet Commonly known as: GLUCOPHAGE Take 500 mg by mouth 2 (two) times daily.   metoprolol succinate 50 MG 24 hr tablet Commonly known as: TOPROL-XL Take 1 tablet (50 mg total) by mouth daily. Take with or immediately following a meal.   pantoprazole 40 MG tablet Commonly known as: PROTONIX Take 1 tablet (40 mg total) by mouth daily. Start taking on: October 03, 2020   polyvinyl alcohol 1.4 % ophthalmic solution Commonly known as: LIQUIFILM TEARS Place 1 drop into both eyes as needed for dry eyes.   Prodigy Autocode Blood Glucose w/Device Kit 1 kit by Does not apply route in the morning, at noon, in the evening, and at bedtime.   rosuvastatin 20 MG tablet Commonly known as: CRESTOR Take 1 tablet (20 mg total) by mouth daily.        Consultations: Hematology General surgery Gastroenterology Infectious disease Pulmonology  Procedures/Studies: 9/15-splenectomy 9/19-left-Thora with removal of 500 cc.  Cultures negative.    CT ABDOMEN PELVIS WO CONTRAST  Result Date: 09/23/2020 CLINICAL DATA:   Abdominal pain. Fever. Postop. Recent laparoscopic splenectomy. EXAM: CT ABDOMEN AND PELVIS WITHOUT CONTRAST TECHNIQUE: Multidetector CT imaging of the abdomen and pelvis was performed following the standard protocol without IV contrast. COMPARISON:  Renal ultrasound 06/12/2020. Abdominopelvic CT 06/26/2019 FINDINGS: Lower chest: Consolidation throughout the included left lower lobe with small pleural effusion. There is enteric contrast in the distal esophagus. Trace right pleural effusion. Hepatobiliary: Prominent size liver spans 19.3 cm cranial caudal. Mild subjective hepatic steatosis. Focal hepatic abnormality on this unenhanced exam. The gallbladder is  decompressed. No visualized calcified gallstone. No biliary dilatation Pancreas: Atrophic.  No ductal dilatation or inflammation. Spleen: Splenectomy. Surgical drain in the left upper quadrant. Small amount of ill-defined fluid and soft tissue density adjacent to the drain without organized collection. Adrenals/Urinary Tract: No adrenal nodule. No hydronephrosis. Mild bilateral perinephric edema. No visualized renal stone or focal lesion. Calcifications at the renal hila are felt to be vascular. Urinary bladder is decompressed by Foley catheter. Stomach/Bowel: Enteric contrast is seen in the distal esophagus. The stomach is physiologically distended. There is no gastric wall thickening. No obstruction, enteric contrast is seen in the colon. Majority of the small bowel is decompressed. No small bowel inflammation. Normal appendix. Irregular wall thickening of the cecum, series 3, image 73, series 6, image 48. This measures up to 13 mm. No colonic inflammation. Vascular/Lymphatic: Advanced aortic and branch atherosclerosis. No aortic aneurysm. No portal venous or mesenteric gas. No enlarged lymph nodes in the abdomen or pelvis. Reproductive: Unremarkable appearance of the uterus. The ovaries are not well-defined, quiescent appearance of the right ovary. The left  ovary is not well seen. No suspicious adnexal mass. Other: Generalized body wall edema, confluent in the left flank. No organized subcutaneous collection. No free air. Minimal ill-defined fluid and soft tissue density in the left upper quadrant with organized abscess or collection. Musculoskeletal: There are no acute or suspicious osseous abnormalities. IMPRESSION: 1. Recent splenectomy. Surgical drain in the left upper quadrant with small amount of ill-defined fluid and soft tissue density adjacent to the drain without organized collection or abscess. 2. Irregular wall thickening of the cecum. If not performed recently, recommend direct visualization with colonoscopy to exclude colonic neoplasm. 3. Consolidation throughout the included left lower lobe with small pleural effusion. Trace right pleural effusion. 4. Anasarca with generalized body wall edema, confluent in the left abdomen and flank. 5. Enteric contrast in the distal esophagus, can be seen with reflux or slow transit. Aortic Atherosclerosis (ICD10-I70.0). Electronically Signed   By: Keith Rake M.D.   On: 09/23/2020 17:20   DG Chest 1 View  Result Date: 09/22/2020 CLINICAL DATA:  Status post left thoracentesis. EXAM: CHEST  1 VIEW COMPARISON:  September 21, 2020. FINDINGS: Stable cardiomegaly. Left pleural effusion is significantly smaller status post thoracentesis. No pneumothorax is noted. IMPRESSION: No pneumothorax status post left thoracentesis. Electronically Signed   By: Marijo Conception M.D.   On: 09/22/2020 17:02   DG Chest 2 View  Result Date: 09/28/2020 CLINICAL DATA:  Hypoxemia. EXAM: CHEST - 2 VIEW COMPARISON:  09/23/2020.  CT, 09/24/2020. FINDINGS: Stable mild enlargement of the cardiopericardial silhouette. Persistent opacity at the left lung base obscures portions of the left heart border and left hemidiaphragm. Left perihilar patchy airspace opacity noted on the prior radiograph has improved. There is mild opacity at the  medial right lung base consistent with atelectasis. Remainder of the lungs is clear. No pneumothorax. Left inferior hemithorax chest tube is stable from the prior exams. IMPRESSION: 1. Mild interval improvement. Opacity noted in the left perihilar lung has decreased from the prior chest radiograph and chest CT. Opacity at the left lung base is without significant change. No change in the left chest tube. 2. No pneumothorax. Electronically Signed   By: Lajean Manes M.D.   On: 09/28/2020 10:57   DG Chest 2 View  Result Date: 09/12/2020 CLINICAL DATA:  CHF, leg swelling EXAM: CHEST - 2 VIEW COMPARISON:  06/12/2020 FINDINGS: Cardiomegaly. Mild, diffuse bilateral interstitial pulmonary opacity and small, layering  bilateral pleural effusions. The visualized skeletal structures are unremarkable. IMPRESSION: Cardiomegaly with mild, diffuse bilateral interstitial pulmonary opacity and small, layering bilateral pleural effusions, findings most consistent with edema. Electronically Signed   By: Eddie Candle M.D.   On: 09/12/2020 16:47   CT HEAD WO CONTRAST (5MM)  Result Date: 09/23/2020 CLINICAL DATA:  Altered mental status. EXAM: CT HEAD WITHOUT CONTRAST TECHNIQUE: Contiguous axial images were obtained from the base of the skull through the vertex without intravenous contrast. COMPARISON:  July 30, 2019 FINDINGS: Brain: No evidence of acute hemorrhage, hydrocephalus, extra-axial collection or mass lesion/mass effect. A large area of encephalomalacia is again seen within the left temporal and left parietal regions. Associated ex vacuo dilatation of the posterior aspect of the left lateral ventricle is noted. Vascular: No hyperdense vessel or unexpected calcification. Skull: A left frontal parietal craniotomy defect is seen. Sinuses/Orbits: No acute finding. Other: None. IMPRESSION: 1. No acute intracranial abnormality. 2. Evidence of prior left frontoparietal craniotomy with stable left temporal and left parietal lobe  encephalomalacia. Electronically Signed   By: Virgina Norfolk M.D.   On: 09/23/2020 22:32   CT CHEST WO CONTRAST  Result Date: 09/24/2020 CLINICAL DATA:  55 year old female with history of empyema. EXAM: CT CHEST WITHOUT CONTRAST TECHNIQUE: Multidetector CT imaging of the chest was performed following the standard protocol without IV contrast. COMPARISON:  Chest CT 02/13/2019. FINDINGS: Cardiovascular: Heart size is mildly enlarged. There is no significant pericardial fluid, thickening or pericardial calcification. Aortic atherosclerosis. No definite coronary artery calcifications. Mediastinum/Nodes: No pathologically enlarged mediastinal or hilar lymph nodes. Please note that accurate exclusion of hilar adenopathy is limited on noncontrast CT scans. Esophagus is unremarkable in appearance. No axillary lymphadenopathy. Increasing asymmetric mass-like enlargement of the left lobe of the thyroid gland which is heterogeneous in appearance measuring 5.4 x 4.9 cm (axial image 4 of series 3) on today's examination as compared with 4.6 x 4.4 cm on prior study 02/13/2019. Lungs/Pleura: Trace right pleural effusion lying dependently. Moderate left pleural effusion which appears partially loculated, predominantly in the region of the major fissure. Areas of passive atelectasis are noted in the adjacent lung, including the left lower lobe which is nearly completely collapsed. Some degree of airspace consolidation is also suspected in the basal segments of the left lower lobe, poorly evaluated on today's noncontrast CT examination. Upper Abdomen: Postoperative changes of recent splenectomy with surgical drain in the upper left retroperitoneum and a small amount of residual postoperative fluid in the surrounding tissues. Musculoskeletal: There are no aggressive appearing lytic or blastic lesions noted in the visualized portions of the skeleton. IMPRESSION: 1. Complex left pleural effusion which is partially loculated, as  above, with associated areas of atelectasis in the lungs, and probable airspace consolidation the basal segments of the left lower lobe concerning for pneumonia. 2. Mild cardiomegaly. 3. Asymmetric masslike enlargement of the left lobe of the thyroid gland which is increased compared to the prior study. Recommend thyroid US (ref: J Am Coll Radiol. 2015 Feb;12(2): 143-50). 4. Additional incidental findings, as above. 5. Aortic atherosclerosis. Aortic Atherosclerosis (ICD10-I70.0). Electronically Signed   By: Vinnie Langton M.D.   On: 09/24/2020 14:23   US Abdomen Limited  Result Date: 09/15/2020 CLINICAL DATA:  History of ITP, assess spleen size EXAM: ULTRASOUND ABDOMEN LIMITED COMPARISON:  CT abdomen/pelvis 06/26/2019 FINDINGS: The spleen size is normal measuring up to 9.8 cm x 10.7 cm x 6.1 cm. There is no focal lesion. IMPRESSION: No evidence of splenomegaly. Electronically Signed  By: Valetta Mole M.D.   On: 09/15/2020 17:53   DG CHEST PORT 1 VIEW  Result Date: 09/23/2020 CLINICAL DATA:  Shortness of breath. EXAM: PORTABLE CHEST 1 VIEW COMPARISON:  Radiograph yesterday. Lung bases from abdominopelvic CT earlier today. FINDINGS: Left basilar consolidation. Slight increased opacity in the left mid lung zone from radiograph yesterday, suggesting layering effusion. Cardiomegaly is stable. Trace right pleural effusion on CT is not well seen. No pneumothorax. No pulmonary edema. IMPRESSION: Left basilar consolidation. Slight increased opacity in the left mid lung zone from radiograph yesterday, suggesting layering effusion. Electronically Signed   By: Keith Rake M.D.   On: 09/23/2020 20:02   DG CHEST PORT 1 VIEW  Result Date: 09/21/2020 CLINICAL DATA:  Chronic respiratory failure. Shortness of breath and fatigue. EXAM: PORTABLE CHEST 1 VIEW COMPARISON:  September 12, 2020 FINDINGS: Stable cardiomegaly. The hila and mediastinum are unchanged. No pneumothorax. There is opacity in the right base.  Significant opacity seen on the left. Associated left effusion suspected. Mild increased interstitial markings in the right lung. IMPRESSION: 1. Significant opacification of the left chest. I suspect a moderate to large left pleural effusion with underlying atelectasis. Infiltrate not excluded. The findings are new since September 12, 2020. 2. Opacity in the right base could represent developing infiltrate or atelectasis. Recommend clinical correlation. 3. Probable mild pulmonary venous congestion. Electronically Signed   By: Dorise Bullion III M.D.   On: 09/21/2020 13:46   US THYROID  Result Date: 09/25/2020 CLINICAL DATA:  Left thyroid mass by chest CT EXAM: THYROID ULTRASOUND TECHNIQUE: Ultrasound examination of the thyroid gland and adjacent soft tissues was performed. COMPARISON:  09/24/2020, 03/08/2017 FINDINGS: Parenchymal Echotexture: Normal Isthmus: 8 mm Right lobe: 5.3 x 1.9 x 1.8 cm Left lobe: 6.9 x 5.5 x 4.7 cm _________________________________________________________ Estimated total number of nodules >/= 1 cm: 1 Number of spongiform nodules >/=  2 cm not described below (TR1): 0 Number of mixed cystic and solid nodules >/= 1.5 cm not described below ([place]verse): 0 _________________________________________________________ Nodule # 1: Location: Left; Mid Maximum size: 5.7, previously 4.3 cm; Other 2 dimensions: 5.3 x 4.0 cm Composition: solid/almost completely solid (2) Echogenicity: isoechoic (1) Shape: not taller-than-wide (0) Margins: ill-defined (0) Echogenic foci: none (0) ACR TI-RADS total points: 3. ACR TI-RADS risk category: TR3 (3 points). ACR TI-RADS recommendations: **Given size (>/= 2.5 cm) and appearance, fine needle aspiration of this mildly suspicious nodule should be considered based on TI-RADS criteria. _________________________________________________________ Additional benign stable subcentimeter hypoechoic nodules in the right thyroid all measuring 7 mm or less. No hypervascularity.  No  regional adenopathy. IMPRESSION: Slightly larger 5.7 cm left mid thyroid TR 3 nodule continues to meet criteria for biopsy as above. The above is in keeping with the ACR TI-RADS recommendations - J Am Coll Radiol 2017;14:587-595. Electronically Signed   By: Jerilynn Mages.  Shick M.D.   On: 09/25/2020 11:20   IR THORACENTESIS ASP PLEURAL SPACE W/IMG GUIDE  Result Date: 09/22/2020 INDICATION: Pleural Effusion EXAM: ULTRASOUND GUIDED DIAGNOSTIC AND THERAPEUTIC THORACENTESIS MEDICATIONS: None. COMPLICATIONS: None immediate. PROCEDURE: An ultrasound guided thoracentesis was thoroughly discussed with the patient and questions answered. The benefits, risks, alternatives and complications were also discussed. The patient understands and wishes to proceed with the procedure. Written consent was obtained. Ultrasound was performed to localize and mark an adequate pocket of fluid in the left chest. The area was then prepped and draped in the normal sterile fashion. 1% Lidocaine was used for local anesthesia. Under ultrasound guidance a  8 Fr Safe-T-Centesis catheter was introduced. Thoracentesis was performed. The catheter was removed and a dressing applied. FINDINGS: A total of approximately 500cc of dark yellow fluid was removed. Samples were sent to the laboratory as requested by the clinical team. IMPRESSION: Successful ultrasound guided diagnostic and therapeutic thoracentesis yielding 500cc of pleural fluid. Electronically Signed   By: Ruthann Cancer M.D.   On: 09/22/2020 16:37       The results of significant diagnostics from this hospitalization (including imaging, microbiology, ancillary and laboratory) are listed below for reference.     Microbiology: Recent Results (from the past 240 hour(s))  Culture, body fluid w Gram Stain-bottle     Status: None   Collection Time: 09/22/20  4:51 PM   Specimen: Pleura  Result Value Ref Range Status   Specimen Description PLEURAL  Final   Special Requests NONE  Final    Culture   Final    NO GROWTH 7 DAYS Performed at [facility], [address], [place], [place] [address]    Report Status 09/29/2020 FINAL  Final  Gram stain     Status: None   Collection Time: 09/22/20  4:51 PM   Specimen: Pleura  Result Value Ref Range Status   Specimen Description PLEURAL  Final   Special Requests NONE  Final   Gram Stain   Final    ABUNDANT WBC PRESENT, PREDOMINANTLY PMN NO ORGANISMS SEEN Performed at [facility], [place] [address], [place], [place] [address]    Report Status 09/22/2020 FINAL  Final  Culture, blood (routine x 2)     Status: None   Collection Time: 09/24/20  9:55 AM   Specimen: BLOOD LEFT HAND  Result Value Ref Range Status   Specimen Description BLOOD LEFT HAND  Final   Special Requests   Final    BOTTLES DRAWN AEROBIC AND ANAEROBIC Blood Culture adequate volume   Culture   Final    NO GROWTH 5 DAYS Performed at [facility], [place] [address], [place], [place] [address]    Report Status 09/29/2020 FINAL  Final  Culture, blood (routine x 2)     Status: None   Collection Time: 09/24/20 10:05 AM   Specimen: BLOOD RIGHT HAND  Result Value Ref Range Status   Specimen Description BLOOD RIGHT HAND  Final   Special Requests   Final    BOTTLES DRAWN AEROBIC AND ANAEROBIC Blood Culture adequate volume   Culture   Final    NO GROWTH 5 DAYS Performed at [facility], [place] [address] [place], [place] [address]    Report Status 09/29/2020 FINAL  Final     Labs:  CBC: Recent Labs  Lab 09/26/20 0227 09/27/20 0336 09/28/20 0334 09/29/20 0217 09/30/20 0458 10/01/20 0344 10/02/20 0457  WBC 16.8* 15.6* 19.2* 20.3* 17.4* 13.9* 13.3*  NEUTROABS 11.8* 10.9*  --   --   --   --   --   HGB 8.1* 8.0* 8.0* 8.3* 8.4* 8.0* 8.5*  HCT 27.5* 27.7* 28.0* 28.3* 28.7* 27.7* 29.7*  MCV 86.5 86.8 86.7 86.3 85.4 85.2 87.9  PLT 746* 755* 810* 812* 864* 870* 892*   BMP &GFR Recent Labs  Lab 09/26/20 0227 09/27/20 0336 09/28/20 0334  09/29/20 0217 09/30/20 0458 10/01/20 0344  NA 136 137 138 139 137 136  K 3.5 3.5 4.1 3.8 3.8 3.8  CL 103 104 105 106 104 103  CO2 _0 GLUCOSE  109* 103* 103* 166* 100* 92  BUN 32* 30* 29* 25* 19 18  CREATININE 1.47* 1.42* 1.31* 1.21* 0.98 1.00  CALCIUM 7.7* 7.9* 8.1* 8.3* 8.5* 8.3*  MG 1.7  --  1.7 1.5* 1.6* 1.9  PHOS  --   --  2.4* 2.2* 2.3* 2.9   Estimated Creatinine Clearance: 82 mL/min (by C-G formula based on SCr of 1 mg/dL). Liver & Pancreas: Recent Labs  Lab 09/28/20 0334 09/29/20 0217 09/30/20 0458 10/01/20 0344  ALBUMIN 2.1* 2.2* 2.2* 2.2*   No results for input(s): LIPASE, AMYLASE in the last 168 hours. No results for input(s): AMMONIA in the last 168 hours. Diabetic: No results for input(s): HGBA1C in the last 72 hours. Recent Labs  Lab 10/01/20 1623 10/01/20 2007 10/02/20 0005 10/02/20 0419 10/02/20 0824  GLUCAP 153* 151* 110* 107* 102*   Cardiac Enzymes: No results for input(s): CKTOTAL, CKMB, CKMBINDEX, TROPONINI in the last 168 hours. No results for input(s): PROBNP in the last 8760 hours. Coagulation Profile: No results for input(s): INR, PROTIME in the last 168 hours. Thyroid Function Tests: No results for input(s): TSH, T4TOTAL, FREET4, T3FREE, THYROIDAB in the last 72 hours. Lipid Profile: No results for input(s): CHOL, HDL, LDLCALC, TRIG, CHOLHDL, LDLDIRECT in the last 72 hours. Anemia Panel: No results for input(s): VITAMINB12, FOLATE, FERRITIN, TIBC, IRON, RETICCTPCT in the last 72 hours. Urine analysis:    Component Value Date/Time   COLORURINE YELLOW 09/22/2020 [place] 09/22/2020 1212   LABSPEC <1.005 (L) 09/22/2020 1212   PHURINE 5.5 09/22/2020 1212   GLUCOSEU NEGATIVE 09/22/2020 1212   HGBUR NEGATIVE 09/22/2020 1212   BILIRUBINUR NEGATIVE 09/22/2020 1212   KETONESUR NEGATIVE 09/22/2020 1212   PROTEINUR NEGATIVE 09/22/2020 1212   UROBILINOGEN 2.0 (H) 07/08/2011 0051   NITRITE NEGATIVE 09/22/2020  1212   LEUKOCYTESUR SMALL (A) 09/22/2020 1212   Sepsis Labs: Invalid input(s): PROCALCITONIN, LACTICIDVEN   Time coordinating discharge: 60 minutes  SIGNED:  Mercy Riding, MD  [facility] 10/02/2020, 11:31 AM

## 2020-10-02 NOTE — Progress Notes (Signed)
Inpatient Rehabilitation Medication Review by a Pharmacist  A complete drug regimen review was completed for this patient to identify any potential clinically significant medication issues.  High Risk Drug Classes Is patient taking? Indication by Medication  Antipsychotic Yes Compazine prn nausea  Anticoagulant Yes LMWH for DVT prophx.  Antibiotic Yes Augmentin: MRSE bacteremia and PNA  Opioid Yes Oxy prn pain. Source unspecified.  Antiplatelet No   Hypoglycemics/insulin Yes SSI for DM  Vasoactive Medication Yes Imdur, Toprol for CHF, HTN  Chemotherapy No   Other No      Type of Medication Issue Identified Description of Issue Recommendation(s)  Drug Interaction(s) (clinically significant)     Duplicate Therapy     Allergy     No Medication Administration End Date     Incorrect Dose     Additional Drug Therapy Needed  ASA81mg /d Ferrous Gluconate 324mg  BID Metformin 500mg  BID Order these medications  Significant med changes from prior encounter (inform family/care partners about these prior to discharge).    Other       Clinically significant medication issues were identified that warrant physician communication and completion of prescribed/recommended actions by midnight of the next day:  Yes  Name of provider notified for urgent issues identified: , PA  Provider Method of Notification: chat  Pharmacist comments:   Time spent performing this drug regimen review (minutes):  15-34min  [place] S. Marissa Nestle, PharmD, BCPS Clinical Staff Pharmacist [email or web address] 30m 10/02/2020 3:07 PM

## 2020-10-03 DIAGNOSIS — R5381 Other malaise: Secondary | ICD-10-CM | POA: Diagnosis not present

## 2020-10-03 LAB — COMPREHENSIVE METABOLIC PANEL
ALT: 14 U/L (ref 0–44)
AST: 25 U/L (ref 15–41)
Albumin: 2.5 g/dL — ABNORMAL LOW (ref 3.5–5.0)
Alkaline Phosphatase: 91 U/L (ref 38–126)
Anion gap: 10 (ref 5–15)
BUN: 24 mg/dL — ABNORMAL HIGH (ref 6–20)
CO2: 23 mmol/L (ref 22–32)
Calcium: 8.7 mg/dL — ABNORMAL LOW (ref 8.9–10.3)
Chloride: 103 mmol/L (ref 98–111)
Creatinine, Ser: 1.26 mg/dL — ABNORMAL HIGH (ref 0.44–1.00)
GFR, Estimated: 51 mL/min — ABNORMAL LOW (ref >60)
Glucose, Bld: 113 mg/dL — ABNORMAL HIGH (ref 70–99)
Potassium: 4.5 mmol/L (ref 3.5–5.1)
Sodium: 136 mmol/L (ref 135–145)
Total Bilirubin: <0.1 mg/dL — ABNORMAL LOW (ref 0.3–1.2)
Total Protein: 6.7 g/dL (ref 6.5–8.1)

## 2020-10-03 LAB — CBC WITH DIFFERENTIAL/PLATELET
Abs Immature Granulocytes: 0 10*3/uL (ref 0.00–0.07)
Basophils Absolute: 0.1 10*3/uL (ref 0.0–0.1)
Basophils Relative: 1 %
Eosinophils Absolute: 0.4 10*3/uL (ref 0.0–0.5)
Eosinophils Relative: 3 %
HCT: 29.6 % — ABNORMAL LOW (ref 36.0–46.0)
Hemoglobin: 8.4 g/dL — ABNORMAL LOW (ref 12.0–15.0)
Lymphocytes Relative: 8 %
Lymphs Abs: 1 10*3/uL (ref 0.7–4.0)
MCH: 24.9 pg — ABNORMAL LOW (ref 26.0–34.0)
MCHC: 28.4 g/dL — ABNORMAL LOW (ref 30.0–36.0)
MCV: 87.8 fL (ref 80.0–100.0)
Monocytes Absolute: 0.6 10*3/uL (ref 0.1–1.0)
Monocytes Relative: 5 %
Neutro Abs: 10.3 10*3/uL — ABNORMAL HIGH (ref 1.7–7.7)
Neutrophils Relative %: 83 %
Platelets: 799 10*3/uL — ABNORMAL HIGH (ref 150–400)
RBC: 3.37 MIL/uL — ABNORMAL LOW (ref 3.87–5.11)
RDW: 24.1 % — ABNORMAL HIGH (ref 11.5–15.5)
WBC: 12.4 10*3/uL — ABNORMAL HIGH (ref 4.0–10.5)
nRBC: 0.2 % (ref 0.0–0.2)
nRBC: 1 /100 WBC — ABNORMAL HIGH

## 2020-10-03 LAB — GLUCOSE, CAPILLARY
Glucose-Capillary: 110 mg/dL — ABNORMAL HIGH (ref 70–99)
Glucose-Capillary: 104 mg/dL — ABNORMAL HIGH (ref 70–99)
Glucose-Capillary: 134 mg/dL — ABNORMAL HIGH (ref 70–99)
Glucose-Capillary: 213 mg/dL — ABNORMAL HIGH (ref 70–99)

## 2020-10-03 MED ORDER — FERROUS GLUCONATE 324 (38 FE) MG PO TABS
324.0000 mg | ORAL_TABLET | Freq: Two times a day (BID) | ORAL | Status: DC
  Administered 2020-10-03 – 2020-10-23 (×42): 324 mg via ORAL
  Filled 2020-10-03 (×45): qty 1

## 2020-10-03 MED ORDER — OXYCODONE HCL 5 MG PO TABS
5.0000 mg | ORAL_TABLET | ORAL | Status: DC | PRN
  Administered 2020-10-03 – 2020-10-06 (×5): 5 mg via ORAL
  Filled 2020-10-03 (×5): qty 1

## 2020-10-03 NOTE — Progress Notes (Signed)
Inpatient Rehabilitation Center Individual Statement of Services  Patient Name:  [name]  Date:  10/03/2020  Welcome to the Inpatient Rehabilitation Center.  Our goal is to provide you with an individualized program based on your diagnosis and situation, designed to meet your specific needs.  With this comprehensive rehabilitation program, you will be expected to participate in at least 3 hours of rehabilitation therapies Monday-Friday, with modified therapy programming on the weekends.  Your rehabilitation program will include the following services:  Physical Therapy (PT), Occupational Therapy (OT), 24 hour per day rehabilitation nursing, Therapeutic Recreaction (TR), Neuropsychology, Care Coordinator, Rehabilitation Medicine, Nutrition Services, and Pharmacy Services  Weekly team conferences will be held on Tuesday to discuss your progress.  Your Inpatient Rehabilitation Care Coordinator will talk with you frequently to get your input and to update you on team discussions.  Team conferences with you and your family in attendance may also be held.  Expected length of stay: 2 weeks  Overall anticipated outcome: min-mod level  Depending on your progress and recovery, your program may change. Your Inpatient Rehabilitation Care Coordinator will coordinate services and will keep you informed of any changes. Your Inpatient Rehabilitation Care Coordinator's name and contact numbers are listed  below.  The following services may also be recommended but are not provided by the Inpatient Rehabilitation Center:   Home Health Rehabiltiation Services Outpatient Rehabilitation Services    Arrangements will be made to provide these services after discharge if needed.  Arrangements include referral to agencies that provide these services.  Your insurance has been verified to be:  [employer] Your primary doctor is:  Annita Brod  Pertinent information will be shared with your doctor and your  insurance company.  Inpatient Rehabilitation Care Coordinator:  Dossie Der, [place] [phone] or Luna Glasgow  Information discussed with and copy given to patient by: Lucy Chris, 10/03/2020, 10:58 AM

## 2020-10-03 NOTE — Progress Notes (Signed)
Pt encouraged to try and void, she stated she didn't have to. This nurse advised it has been over 6 hours of last void and she has a history of retention, if she cannot void an in and out cath is ordered by the provider. She stated she will drink more water and try and void later but is trying to get rest at this time. Patient was educated on the need for in and out. No other concerns to report at this time.

## 2020-10-03 NOTE — Evaluation (Addendum)
Occupational Therapy Assessment and Plan  Patient Details  Name: [name] MRN: [id number] Date of Birth: [birth year]  OT Diagnosis: acute pain, muscle weakness (generalized), and swelling of limb Rehab Potential: Rehab Potential (ACUTE ONLY): Fair ELOS: 2 weeks   Today's Date: 10/03/2020 OT Individual Time: 3664-4034 OT Individual Time Calculation (min): 57 min     Hospital Problem: Principal Problem:   Debility   Past Medical History:  Past Medical History:  Diagnosis Date   Acute exacerbation of CHF (congestive heart failure) ([place]) 04/11/2019   Anemia    Arthritis    "spine, hands" (11/25/2015)   Back pain    "all my back; probably 3 times/week" (11/25/2015)   Chronic indwelling Foley catheter 03/04/2017   Diabetic foot ulcer associated with type 2 diabetes mellitus ([place])    Hypertension    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 a teenager    States she had burst blood vessel as teenager, now with resultant minor visual field and hearing deficits   Neuropathy    Stroke ([place]) ~ 1982   "my feeling on my RLE, right lower eye vision,  & hearing out of my right ear not the same since" (11/25/2015)   Type II diabetes mellitus ([place])    Past Surgical History:  Past Surgical History:  Procedure Laterality Date   AMPUTATION  11/24/2010   Procedure: AMPUTATION FOOT;  Surgeon: Wylene Simmer, MD;  Location: [place];  Service: Orthopedics;  Laterality: Right;  Right  FOOT TRANS METATARSAL AMPUTATION   AMPUTATION  07/02/2011   Procedure: AMPUTATION BELOW KNEE;  Surgeon: Wylene Simmer, MD;  Location: [place];  Service: Orthopedics;  Laterality: Right;   APPLICATION OF WOUND VAC  07/02/2011   Procedure: APPLICATION OF WOUND VAC;  Surgeon: Wylene Simmer, MD;  Location: [place];  Service: Orthopedics;  Laterality: Right;   BRAIN SURGERY  1980's   Previous brain surgery in [place], [place]  2004; 2006   COLONOSCOPY N/A 09/17/2015   Procedure: COLONOSCOPY;  Surgeon: Wilford Corner,  MD;  Location: [facility] ENDOSCOPY;  Service: Endoscopy;  Laterality: N/A;   ESOPHAGOGASTRODUODENOSCOPY N/A 09/17/2015   Procedure: ESOPHAGOGASTRODUODENOSCOPY (EGD);  Surgeon: Wilford Corner, MD;  Location: [facility] ENDOSCOPY;  Service: Endoscopy;  Laterality: N/A;   I & D EXTREMITY  11/24/2010   Procedure: IRRIGATION AND DEBRIDEMENT EXTREMITY;  Surgeon: Wylene Simmer, MD;  Location: [place];  Service: Orthopedics;  Laterality: Left;  Debriedment of left plantar ulcer and trimming of toenails   I & D EXTREMITY  07/02/2011   Procedure: IRRIGATION AND DEBRIDEMENT EXTREMITY;  Surgeon: Wylene Simmer, MD;  Location: [place];  Service: Orthopedics;  Laterality: Left;  I & D of Left Foot   I & D EXTREMITY  07/06/2011   Procedure: IRRIGATION AND DEBRIDEMENT EXTREMITY;  Surgeon: Wylene Simmer, MD;  Location: [place];  Service: Orthopedics;  Laterality: Right;  IRRIGATION/DEBRIDEMENT RIGHT BELOW KNEE AMPUTATION   IR THORACENTESIS ASP PLEURAL SPACE W/IMG GUIDE  09/22/2020   LAPAROSCOPIC SPLENECTOMY N/A 09/18/2020   Procedure: HAND ASSISTED LAPAROSCOPIC SPLENECTOMY;  Surgeon: Stark Klein, MD;  Location: [place];  Service: General;  Laterality: N/A;  [place] LIGATION  2006    Assessment & Plan Clinical Impression:  [name] is a 55 year old female with history of  HTN, T2DM, stroke, ICH, urinary retention-foley dependent, chronic hypoxic respiratory failure, R-BKA (CIR 2013), recurrent acute ITP (Dr. Lindi Adie) treated with IVIG/Nplate who was admitted on 09/12/20 with fatigue, Hgb 4.5, WBC 2.8,  platelets <50,000 and AKI. Patient had not followed up with cancer center and run out of lasix X one months. Fluid overload treated with IV diuresis, 2 units PRBC and hyperkalemia treated with lokelma. She was treated with decadron, Nplate and IVIG. Elevated BS treated with insulin and decadron taper.  She was received aranesp and feraheme for supplement and splenectomy recommended by Dr. Marin Olp. She received triple  vaccine-PNA/Haemophilus/meningococcus and was agreeable to undergo splenectomy on 09/15 by Dr. Barry Dienes.    Post op developed leucocytosis with fever up to 102.2 and was started ton Vanc/Cefepime/Flagyl for sepsis due to staph epi bacteremia with left pleural effusion. Pleural effusion tapped 09/19 for 500 cc fluid and cultures negative for infection.  She developed hypoxia with MS changes and CXR showed slight increase in left mid zone opacity. CT chest done 09/21 showing complex partially loculated pleural effusion with airspace consolidation in LLL concerning for PNA and assymetric enlargement of left thyroid lobe. CT abdomen was negative for infection but showed cecal wall thickening and Dr. Carlean Purl recommended colonoscopy for evaluation once medically appropriate or nearing discharge. Thyroid ultrasound showed two left thyroid masses with recommendations for FNA/surgical evaluation after discharge and to follow up with Dr. Verlee Monte for left effusion.     Dr. Gale Journey recommends IV antibiotics thorough 09/26 followed by 3 weeks of Augmentin given effusion.  JP drain remains in place and small hematoma suspected adjacent to incision --JP pulled today. Leucocytosis resolving and felt to be steroid effect. She continues to be limited by weakness and has difficulty with standing. CIR recommended due to functional decline. Patient transferred to CIR on 10/02/2020 .    Patient currently requires max to total A with basic self-care skills secondary to muscle weakness and decreased initiation, decreased awareness, and delayed processing, decreased balance.  Prior to hospitalization, patient could complete ADLs with set up A, and transfers with min A with help of son, but amount of assist from son is not very clear as pt is inconsistent with her responses.  Patient will benefit from skilled intervention to decrease level of assist with basic self-care skills, increase independence with basic self-care skills, and increase  level of independence with iADL prior to discharge home.  Anticipate patient will require intermittent supervision, minimal physical assistance, and moderate physical assestance and follow up home health.  OT - End of Session Activity Tolerance: Tolerates < 10 min activity with changes in vital signs Endurance Deficit: Yes Endurance Deficit Description: Pt with decreased endurance, especially without supplemental oxyge OT Assessment Rehab Potential (ACUTE ONLY): Fair OT Barriers to Discharge: Inaccessible home environment;Decreased caregiver support;Home environment access/layout;Incontinence;Wound Care;Lack of/limited family support;Weight OT Patient demonstrates impairments in the following area(s): Balance;Behavior;Endurance;Motor;Pain;Safety;Sensory OT Basic ADL's Functional Problem(s): Grooming;Bathing;Dressing;Toileting OT Advanced ADL's Functional Problem(s): Simple Meal Preparation OT Transfers Functional Problem(s): Toilet;Tub/Shower OT Plan OT Intensity: Minimum of 1-2 x/day, 45 to 90 minutes OT Frequency: 5 out of 7 days OT Duration/Estimated Length of Stay: 2 weeks OT Treatment/Interventions: Balance/vestibular training;Discharge planning;Pain management;Self Care/advanced ADL retraining;Therapeutic Activities;Functional mobility training;Patient/family education;Skin care/wound managment;Therapeutic Exercise;Community reintegration;DME/adaptive equipment instruction;Psychosocial support;UE/LE Strength taining/ROM;Wheelchair propulsion/positioning OT Self Feeding Anticipated Outcome(s): Set up A OT Basic Self-Care Anticipated Outcome(s): Min-Mod A OT Toileting Anticipated Outcome(s): Mod A OT Bathroom Transfers Anticipated Outcome(s): Min A OT Recommendation Patient destination: Home Follow Up Recommendations: Home health OT Equipment Recommended: To be determined Equipment Details: Pt reports having a w/c, RW, and BSC at home. However unclear on if pt has drop arm BSC, and if  it'll be  needed depending on how transfers progress during CIR   OT Evaluation Precautions/Restrictions  Precautions Precautions: Fall Precaution Comments: R BKA prosthesis not able to don 2/2 edema. Restrictions Weight Bearing Restrictions: No Vital Signs Pt with initial oxygen stat of 72 with RA at beginning of session, continued session with supplemental oxygen for all activities, with stats 88-92 range. Pain Pain Assessment Pain Scale: 0-10 Pain Score: 0-No pain (occasional incisional pai, but not at this time.) Home Living/Prior [place] expects to be discharged to:: Private residence Living Arrangements: Children Available Help at Discharge: Family, Available PRN/intermittently Type of Home: House Home Access: Level entry Entrance Stairs-Number of Steps: pt states only threshold to enter. Entrance Stairs-Rails: None Home Layout: Two level, Able to live on main level with bedroom/bathroom Bathroom Shower/Tub: Other (comment) (Pt reports having full bath upstairs with tub/shower but reports not bathing upstairs since about 2013 after old BKA and bathes at sink at w/c level) Biochemist, clinical:  (Standard height with BSC over toilet) Bathroom Accessibility: Yes  Lives With: Son IADL History Homemaking Responsibilities: Yes Meal Prep Responsibility: Primary Laundry Responsibility: Secondary Cleaning Responsibility: Secondary Current License: No (Pt reports not driving since before 2013 after BKA, unsure on if holding a current license but pt does not drive) Prior Function Level of Independence: Requires assistive device for independence (pt states Prior to recent illness/weakness, able to care for self, son had been helping recently.)  Able to Take Stairs?: No Driving: No Comments: w/c mobility at home, pt states not using prosthesis since 2014? Vision Baseline Vision/History: 1 Wears glasses Ability to See in Adequate Light: 0 Adequate Patient  Visual Report: No change from baseline Additional Comments: Need to be further assessed for more concrete detail Perception  Perception: Within Functional Limits Praxis Praxis: Intact Cognition Overall Cognitive Status: Within Functional Limits for tasks assessed Arousal/Alertness: Awake/alert Orientation Level: Person;Place;Situation Person: Oriented Place: Oriented Situation: Oriented Year: 2022 Month: September Day of Week: Correct Immediate Memory Recall: Blue;Bed Memory Recall Sock: Not able to recall Memory Recall Blue: Without Cue Memory Recall Bed: Without Cue Attention: Divided Divided Attention: Impaired Awareness: Impaired Problem Solving: Impaired Executive Function: Initiating;Sequencing Sequencing: Impaired Initiating: Impaired Safety/Judgment: Impaired Sensation Sensation Light Touch: Appears Intact Proprioception: Appears Intact Additional Comments: Pt reports light touch feeling the same in both arms but with her last 3 digits on her L hand they feel numb Coordination Gross Motor Movements are Fluid and Coordinated: Yes Fine Motor Movements are Fluid and Coordinated: Yes Finger Nose Finger Test: To be further assessed Heel Shin Test: R BKA Motor  Motor Motor: Within Functional Limits  Trunk/Postural Assessment  Postural Control Postural Control: Deficits on evaluation Trunk Control: uses UEs for support.  Balance Balance Balance Assessed: Yes Dynamic Sitting Balance Dynamic Sitting - Balance Support: Feet supported;During functional activity Dynamic Sitting - Level of Assistance: 5: Stand by assistance Sitting balance - Comments: Dynamic sitting EOB without LOB, supervision for safety. Extremity/Trunk Assessment RUE Assessment RUE Assessment: Within Functional Limits Active Range of Motion (AROM) Comments: WFL General Strength Comments: WFL LUE Assessment LUE Assessment: Within Functional Limits Active Range of Motion (AROM) Comments:  WFL General Strength Comments: [facility]  Care Tool Care Tool Self Care Eating   Eating Assist Level: Set up assist    Oral Care         Bathing   Body parts bathed by patient: Left arm;Right arm;Abdomen;Chest;Right upper leg;Left upper leg;Face Body parts bathed by helper: Front perineal area;Buttocks;Left lower leg Body parts n/a:  Right lower leg Assist Level: Maximal Assistance - Patient 24 - 49%    Upper Body Dressing(including orthotics)   What is the patient wearing?: Pull over shirt   Assist Level: Set up assist    Lower Body Dressing (excluding footwear)   What is the patient wearing?: Pants Assist for lower body dressing: Total Assistance - Patient < 25%    Putting on/Taking off footwear   What is the patient wearing?: Non-skid slipper socks Assist for footwear: Total Assistance - Patient < 25%       Care Tool Toileting Toileting activity   Assist for toileting: Total Assistance - Patient < 25%     Care Tool Bed Mobility Roll left and right activity   Roll left and right assist level: Minimal Assistance - Patient > 75%    Sit to lying activity   Sit to lying assist level: Minimal Assistance - Patient > 75%    Lying to sitting on side of bed activity   Lying to sitting on side of bed assist level: the ability to move from lying on the back to sitting on the side of the bed with no back support.: Supervision/Verbal cueing     Care Tool Transfers Sit to stand transfer   Sit to stand assist level: Dependent - Patient 0%    Chair/bed transfer   Chair/bed transfer assist level: Total Assistance - Patient < 25%     Toilet transfer Toilet transfer activity did not occur: Safety/medical concerns (pt incontinent, unable to attempt transfer due to poor weight shifting and endurance)       Care Tool Cognition  Expression of Ideas and Wants Expression of Ideas and Wants: 3. Some difficulty - exhibits some difficulty with expressing needs and ideas (e.g, some words or  finishing thoughts) or speech is not clear  Understanding Verbal and Non-Verbal Content Understanding Verbal and Non-Verbal Content: 3. Usually understands - understands most conversations, but misses some part/intent of message. Requires cues at times to understand   Memory/Recall Ability Memory/Recall Ability : Current season;That he or she is in a hospital/hospital unit   Refer to Care Plan for [place] 1 OT Short Term Goal 1 (Week 1): Pt will bathe lower body with mod A OT Short Term Goal 2 (Week 1): Pt will dress lower body with max A OT Short Term Goal 3 (Week 1): Pt will complete lateral weight shifting with mod A in preparation for LB dressing OT Short Term Goal 4 (Week 1): Pt will complete toileting tasks with Max A OT Short Term Goal 5 (Week 1): Pt will complete toileting transfer with Max A  Recommendations for other services: None    Skilled Therapeutic Intervention OT evaluation completed with explanation of purpose of OT, process of rehab, and POC. Session with focus on ADL retraining, sitting dynamic balance and bed mobility. Pt received semi-supine in bed, alert and agreeable to therapy services. Completed toileting with total assist at bed level due to incontinence, with pt able to roll L<>R with supervision. Transitioned semi-supine > sitting EOB with CGA, then requiring max A for bathing while seated EOB, and set up A for upper body dressing. Pt required bed level, Max A for lower body dressing as pt attempted to stand but unable to clear buttocks for clohting management. Pt required min A for sit to supine. Pt left semi-supine in bed, within the care of PT at therapist departure.  ADL ADL Upper Body Bathing: Supervision/safety  Where Assessed-Upper Body Bathing: Edge of bed Lower Body Bathing: Other (comment) (Total A) Where Assessed-Lower Body Bathing: Edge of bed;Bed level Upper Body Dressing: Minimal assistance (min A to assist with  manuevering oxygen tube whil donning shirt) Where Assessed-Upper Body Dressing: Edge of bed Lower Body Dressing: Maximal assistance Where Assessed-Lower Body Dressing: Edge of bed;Bed level (pt able to thread pants EOB, but needed Max A at bed level to pull up pants using rolling technique) Toileting: Other (Comment) (Total A, incontinent) Where Assessed-Toileting: Bed level ADL Comments: Pt attempted to stand with RW at baseline, however pt unable to clear hips in order for therapist to donn pants over hips, LB dressing completed at bed level after threading Mobility  Bed Mobility Bed Mobility: Rolling Right;Supine to Sit Rolling Right: Supervision/verbal cueing Rolling Left: Supervision/Verbal cueing Supine to Sit: Contact Guard/Touching assist Sitting - Scoot to Edge of Bed: Contact Guard/Touching assist Sit to Supine: (P) Contact Guard/Touching assist Transfers Sit to Stand: Dependent - Patient 0% (attempted to stand from elevated bed, but fear prevents.)   Discharge Criteria: Patient will be discharged from OT if patient refuses treatment 3 consecutive times without medical reason, if treatment goals not met, if there is a change in medical status, if patient makes no progress towards goals or if patient is discharged from hospital.  The above assessment, treatment plan, treatment alternatives and goals were discussed and mutually agreed upon: by patient  Hope E Poindexter 10/03/2020, 2:43 PM    Evaluation and documentation completed by two OTR/L with this therapist taking charges. [place], [facility]

## 2020-10-03 NOTE — Progress Notes (Signed)
Patient information reviewed and entered into [facility] System by Becky Gretel Cantu, PPS coordinator. Information including medical coding, function ability, and quality indicators will be reviewed and updated through discharge.   

## 2020-10-03 NOTE — Progress Notes (Signed)
PROGRESS NOTE   Subjective/Complaints: Mrs. Roselli has no new complaints this morning. Trey Paula mentioned she was put on O2 earlier during therapy- denies shortness of breath now  ROS: denies shortness of breath   Objective:   No results found. Recent Labs    10/02/20 0457 10/03/20 0606  WBC 13.3* 12.4*  HGB 8.5* 8.4*  HCT 29.7* 29.6*  PLT 892* 799*   Recent Labs    10/01/20 0344 10/03/20 0606  NA 136 136  K 3.8 4.5  CL 103 103  CO2 27 23  GLUCOSE 92 113*  BUN 18 24*  CREATININE 1.00 1.26*  CALCIUM 8.3* 8.7*    Intake/Output Summary (Last 24 hours) at 10/03/2020 1035 Last data filed at 10/02/2020 2020 Gross per 24 hour  Intake 120 ml  Output --  Net 120 ml        Physical Exam: Vital Signs Blood pressure (!) 149/69, pulse 72, temperature 97.7 F (36.5 C), temperature source Oral, resp. rate 16, height 5\' 7"  (1.702 m), weight 107 kg, SpO2 92 %. Gen: no distress, normal appearing HEENT: oral mucosa pink and moist, NCAT Cardio: Reg rate Pulmonary:     Effort: Pulmonary effort is normal.     Breath sounds: Normal breath sounds.     Comments: CTA B/L- good air movement- O2 sats 86% to 92% on RA Abdominal:     General: There is distension (LLQ with edema extending to left upper thigh).     Comments: LUQ wound with dry dressing. - serous drainage- not through dressing Soft, NT, ND, (+)BS  Genitourinary:    Comments: Purewick in place Musculoskeletal:     Cervical back: Normal range of motion and neck supple.     Comments: Ues- 4+/5 except 4/5 in FA B/L Les; RLE- 4+/5 in HF/KE/KF- old R BKA LLE- HF 4-/5, distally 4+/5   Assessment/Plan: 1. Functional deficits which require 3+ hours per day of interdisciplinary therapy in a comprehensive inpatient rehab setting. Physiatrist is providing close team supervision and 24 hour management of active medical problems listed below. Physiatrist and rehab team  continue to assess barriers to discharge/monitor patient progress toward functional and medical goals  Care Tool:  Bathing              Bathing assist       Upper Body Dressing/Undressing Upper body dressing   What is the patient wearing?: Hospital gown only    Upper body assist Assist Level: Set up assist    Lower Body Dressing/Undressing Lower body dressing      What is the patient wearing?: Ace wrap/stump shrinker, Orthosis     Lower body assist Assist for lower body dressing: Moderate Assistance - Patient 50 - 74% Assistive Device Comment: Right prothesis   Toileting Toileting    Toileting assist Assist for toileting: 2 Helpers     Transfers Chair/bed transfer  Transfers assist     Chair/bed transfer assist level: 2 Helpers     Locomotion Ambulation   Ambulation assist              Walk 10 feet activity   Assist  Walk 50 feet activity   Assist           Walk 150 feet activity   Assist           Walk 10 feet on uneven surface  activity   Assist           Wheelchair     Assist               Wheelchair 50 feet with 2 turns activity    Assist            Wheelchair 150 feet activity     Assist          Blood pressure (!) 149/69, pulse 72, temperature 97.7 F (36.5 C), temperature source Oral, resp. rate 16, height 5\' 7"  (1.702 m), weight 107 kg, SpO2 92 %.  Medical Problem List and Plan: 1.  Debility secondary to ITP/s/p splenectomy and old R BKA- hasn't worn her new prosthesis due to fitting issues             -patient may  shower if cover dressing             -ELOS/Goals: 10-14 days- mod I to supervision  -Initial CIR evaluations today 2.  Antithrombotics: -DVT/anticoagulation:  Pharmaceutical: Lovenox             -antiplatelet therapy: N/A 3. Pain: Decrease Oxycodone to 5mg  q4H prn.  4. Mood: LCSW to follow for evaluation and support.              -antipsychotic  agents: N/A 5. Neuropsych: This patient is capable of making decisions on her own behalf. 6. Skin/Wound Care: Monitor wound for healing.   7. Fluids/Electrolytes/Nutrition: Monitor I/O. Check CMET in am.  8. ITP/pancytopenia/anemia:Improving with Hgb-8.4, WBC 13.3 and platelets -892. Will hold off on aspirin.  9. MRSE bacteremia/HAP: IV antibiotics transitioned to Augmentin thru 10/20/20 10. Left pleural effusion s/p thoracotomy 9/19: To follow up with pulmonary after discharge.                   --encourage pulmonary hygiene with flutter valve.  11. Acute on chronic CHF: Lasix decreased to 40 mg bid. Amlodipine d/c.              --monitor I/O. Check weights daily. Monitor for signs of overload.             --continue Toprol XL, Lasix,  12. HTN: Monitor BP TID--continue Toprol, Lasix, Imdur.  13. T2DM: Hgb A1C- 5.5 and well controlled on metformin BID PTA             --on hold due to AKI. Will continue to monitor BS ac/hs and use SSI for now 14. Acute on chronic CKD III: BUN/SCr has normalized to 18/1.0  15. Thyroid nodules: Follow up with [facility] after discharge.  16.  Cecal wall thickening: Will need to contact GI when nearing discharge to determine timing of colonoscopy.  17. Severe obesity BMI 36.95: provide dietary education,     LOS: 1 days A FACE TO FACE EVALUATION WAS PERFORMED  10/22/20 P Wilmetta Speiser 10/03/2020, 10:35 AM

## 2020-10-03 NOTE — Progress Notes (Signed)
Inpatient Rehabilitation Care Coordinator Assessment and Plan Patient Details  Name: [name] MRN: [id number] Date of Birth: [birth year]  Today's Date: 10/03/2020  Hospital Problems: Principal Problem:   Debility  Past Medical History:  Past Medical History:  Diagnosis Date   Acute exacerbation of CHF (congestive heart failure) (HCC) 04/11/2019   Anemia    Arthritis    "spine, hands" (11/25/2015)   Back pain    "all my back; probably 3 times/week" (11/25/2015)   Chronic indwelling Foley catheter 03/04/2017   Diabetic foot ulcer associated with type 2 diabetes mellitus (HCC)    Hypertension    Intracerebral hemorrhage (HCC) As a teenager    States she had burst blood vessel as teenager, now with resultant minor visual field and hearing deficits   Neuropathy    Stroke (HCC) ~ 1982   "my feeling on my RLE, right lower eye vision,  & hearing out of my right ear not the same since" (11/25/2015)   Type II diabetes mellitus (HCC)    Past Surgical History:  Past Surgical History:  Procedure Laterality Date   AMPUTATION  11/24/2010   Procedure: AMPUTATION FOOT;  Surgeon: [name], MD;  Location: [facility] OR;  Service: Orthopedics;  Laterality: Right;  Right  FOOT TRANS METATARSAL AMPUTATION   AMPUTATION  07/02/2011   Procedure: AMPUTATION BELOW KNEE;  Surgeon: [name], MD;  Location: [facility] OR;  Service: Orthopedics;  Laterality: Right;   APPLICATION OF WOUND VAC  07/02/2011   Procedure: APPLICATION OF WOUND VAC;  Surgeon: [name], MD;  Location: [facility] OR;  Service: Orthopedics;  Laterality: Right;   BRAIN SURGERY  1980's   Previous brain surgery in [place], [place]   CESAREAN SECTION  2004; 2006   COLONOSCOPY N/A 09/17/2015   Procedure: COLONOSCOPY;  Surgeon: Charlott Rakes, MD;  Location: [facility] ENDOSCOPY;  Service: Endoscopy;  Laterality: N/A;   ESOPHAGOGASTRODUODENOSCOPY N/A 09/17/2015   Procedure: ESOPHAGOGASTRODUODENOSCOPY (EGD);  Surgeon: Charlott Rakes, MD;  Location: [facility]  ENDOSCOPY;  Service: Endoscopy;  Laterality: N/A;   I & D EXTREMITY  11/24/2010   Procedure: IRRIGATION AND DEBRIDEMENT EXTREMITY;  Surgeon: [name], MD;  Location: [facility] OR;  Service: Orthopedics;  Laterality: Left;  Debriedment of left plantar ulcer and trimming of toenails   I & D EXTREMITY  07/02/2011   Procedure: IRRIGATION AND DEBRIDEMENT EXTREMITY;  Surgeon: [name], MD;  Location: [facility] OR;  Service: Orthopedics;  Laterality: Left;  I & D of Left Foot   I & D EXTREMITY  07/06/2011   Procedure: IRRIGATION AND DEBRIDEMENT EXTREMITY;  Surgeon: [name], MD;  Location: [facility] OR;  Service: Orthopedics;  Laterality: Right;  IRRIGATION/DEBRIDEMENT RIGHT BELOW KNEE AMPUTATION   IR THORACENTESIS ASP PLEURAL SPACE W/IMG GUIDE  09/22/2020   LAPAROSCOPIC SPLENECTOMY N/A 09/18/2020   Procedure: HAND ASSISTED LAPAROSCOPIC SPLENECTOMY;  Surgeon: Almond Lint, MD;  Location: [facility] OR;  Service: General;  Laterality: N/A;  120 MINUTES   TUBAL LIGATION  2006   Social History:  reports that she has never smoked. She has never used smokeless tobacco. She reports current alcohol use of about 2.0 standard drinks per week. She reports that she does not use drugs.  Family / Support Systems Marital Status: Widow/Widower How Long?: 2012 Patient Roles: Parent, Other (Comment) (daughter) Children: 50 & 11 yo Other Supports: Patsy-Mom [phone] Anticipated Caregiver: Self and son along with Mom Ability/Limitations of Caregiver: Mom lives in [place], son is 72 yo and in school Caregiver Availability: Evenings only Family Dynamics: Close  with children, Mom and extended family who reside in [place]. She wants to be mod/i before going home and feels can reach this. 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 She is hopeful she will regain her stength and get to mod/i level. She relises upon her children to pull her through Recent Psychosocial Issues: other health issues Psychiatric History: No history do feel she would benefit from seeing neuro-psych while here. She has a lot going on and multiple health issues and is a single parent Substance Abuse History: No issues  Patient / Family Perceptions, Expectations & Goals Pt/Family understanding of illness & functional limitations: Pt is able to explain her platlets were low and then had some complications. She is ready to work and get back to her mod/i wheelchair level and hopeful to be able to use prothesis since could not due to leg swollen and did not fit well. Premorbid pt/family roles/activities: Mom, daughter, cousin, home owner, etc Anticipated changes in roles/activities/participation: resume Pt/family expectations/goals: Pt states: " I am ready to get better and do for myself, at least I hope too."  [employer]: None Premorbid Home Care/DME Agencies: Other (Comment) (has had HH in the past, has wc, rw, drop-arm bsc) Transportation available at discharge: Mom Is the patient able to respond to  transportation needs?: Yes In the past 12 months, has lack of transportation kept you from medical appointments or from getting medications?: No In the past 12 months, has lack of transportation kept you from meetings, work, or from getting things needed for daily living?: No Resource referrals recommended: Neuropsychology  Discharge Planning Living Arrangements: Children Support Systems: Children, Parent, Friends/neighbors Type of Residence: Private residence Insurance Resources: Media planner (specify) Passenger transport manager) Financial Resources: SSD, Family Support ([place] social security from deceased father) Financial Screen Referred: No Living Expenses: Own Money Management: Patient Does the patient have any problems obtaining your medications?: No Home Management: Pt and mom Patient/Family Preliminary Plans: Return home with her children and Mom assisting. She has always been able to manage and feels she can again. She needs to get stronger and mobile and is hopeful this can be achieved while here Care Coordinator Barriers to Discharge: Decreased caregiver support, Insurance for SNF coverage Care Coordinator Anticipated Follow Up Needs: HH/OP  Clinical Impression Pleasant female who is motivated to do well here and regain her independence. She was mod/I wheelchair level and did use prothesis until her leg swelled and it did not fit. She has two minor children she lives with and her Mom comes frequently from [place] to assist. Aware of rehab process since has been here in 2013. Will await therapy evaluations and work on discharge needs.  Haidynn Almendarez,  Lemar Livings 10/03/2020, 10:56 AM

## 2020-10-03 NOTE — Progress Notes (Signed)
Physical Therapy Session Note  Patient Details  Name: [name] MRN: [id number] Date of Birth: 05-01-1965  Today's Date: 10/03/2020 PT Individual Time: 1300-1410 PT Individual Time Calculation (min): 70 min   Short Term Goals: Week 1:  PT Short Term Goal 1 (Week 1): Pt will transfer sup <> sit w/ mod I. PT Short Term Goal 2 (Week 1): Pt will transfer bed <> w/c w/ max A using slide board. PT Short Term Goal 3 (Week 1): Pt will transfer sit to stand w/ max A  Skilled Therapeutic Interventions/Progress Updates:   Received pt sitting upright bed, pt agreeable to PT treatment, and denied any pain during session. Session with emphasis on functional mobility/transfers, generalized strengthening, and improved activity tolerance. O2 sat 90% on 2L O2 - pt asymptomatic but reports her O2 levels fluctuate. Pt transferred long sitting<>sitting EOB with HOB elevated and supervision. Attempted to transfer bed<>WC via slideboard to R however pt unable to 2/2 fear and difficulty sequencing movements. Instead transferred bed<>WC via slideboard to L with mod A of 1 and multimodal cues for hand placement, anterior weight shifting, and head/hips relationship. O2 sat 93% after transfer. Pt performed WC mobility 143ft using BUE and supervision to [facility] elevators and transported to 4W dayroom in [facility] total A remainder of way for energy conservation purposes. Pt then transferred WC<>mat via slideboard with min A to L x 2 additional trials with same cues mentioned above. Pt performed the following exercises sitting EOM with supervision, increased time, and verbal cues for technique: -LAQ 2x10 with 1lb ankle weight on LLE and 2x10 unweighted on RLE -hip flexion 2x10 with 1lb ankle weight on LLE and 2x10 unweighted on RLE -tricep extensions on yoga blocks 2x8 - limited ability to lift buttocks  -hip adduction isometric squeezes 2x10 Pt transported back to room in [facility] total A and requested to return to bed. WC<>bed via  slideboard to L with min A. Concluded session with pt sitting upright in bed, needs within reach, and bed alarm on. RN present at bedside packing up pt's belongings to be transported to 4W.  Therapy Documentation Precautions:  Restrictions Weight Bearing Restrictions: No  Therapy/Group: Individual Therapy Dowdle Majestic PT, DPT   10/03/2020, 7:45 AM

## 2020-10-03 NOTE — Evaluation (Signed)
Physical Therapy Assessment and Plan  Patient Details  Name: [name] MRN: [id number] Date of Birth: 06-Sep-1965  PT Diagnosis: Abnormal posture, Edema, and Muscle weakness Rehab Potential: Good ELOS: 2 weeks   Today's Date: 10/03/2020 PT Individual Time: 0932-1030 PT Individual Time Calculation (min): 53 min    Hospital Problem: Principal Problem:   Debility   Past Medical History:  Past Medical History:  Diagnosis Date   Acute exacerbation of CHF (congestive heart failure) ([place]) 04/11/2019   Anemia    Arthritis    "spine, hands" (11/25/2015)   Back pain    "all my back; probably 3 times/week" (11/25/2015)   Chronic indwelling Foley catheter 03/04/2017   Diabetic foot ulcer associated with type 2 diabetes mellitus ([place])    Hypertension    Intracerebral hemorrhage ([place]) As a teenager    States she had burst blood vessel as teenager, now with resultant minor visual field and hearing deficits   Neuropathy    Stroke ([place]) ~ 1982   "my feeling on my RLE, right lower eye vision,  & hearing out of my right ear not the same since" (11/25/2015)   Type II diabetes mellitus ([place])    Past Surgical History:  Past Surgical History:  Procedure Laterality Date   AMPUTATION  11/24/2010   Procedure: AMPUTATION FOOT;  Surgeon: Wylene Simmer, MD;  Location: [place];  Service: Orthopedics;  Laterality: Right;  Right  FOOT TRANS METATARSAL AMPUTATION   AMPUTATION  07/02/2011   Procedure: AMPUTATION BELOW KNEE;  Surgeon: Wylene Simmer, MD;  Location: [place];  Service: Orthopedics;  Laterality: Right;   APPLICATION OF WOUND VAC  07/02/2011   Procedure: APPLICATION OF WOUND VAC;  Surgeon: Wylene Simmer, MD;  Location: [place];  Service: Orthopedics;  Laterality: Right;   BRAIN SURGERY  1980's   Previous brain surgery in [place], [place]  2004; 2006   COLONOSCOPY N/A 09/17/2015   Procedure: COLONOSCOPY;  Surgeon: Wilford Corner, MD;  Location: [facility] ENDOSCOPY;  Service: Endoscopy;   Laterality: N/A;   ESOPHAGOGASTRODUODENOSCOPY N/A 09/17/2015   Procedure: ESOPHAGOGASTRODUODENOSCOPY (EGD);  Surgeon: Wilford Corner, MD;  Location: [facility] ENDOSCOPY;  Service: Endoscopy;  Laterality: N/A;   I & D EXTREMITY  11/24/2010   Procedure: IRRIGATION AND DEBRIDEMENT EXTREMITY;  Surgeon: Wylene Simmer, MD;  Location: [place];  Service: Orthopedics;  Laterality: Left;  Debriedment of left plantar ulcer and trimming of toenails   I & D EXTREMITY  07/02/2011   Procedure: IRRIGATION AND DEBRIDEMENT EXTREMITY;  Surgeon: Wylene Simmer, MD;  Location: [place];  Service: Orthopedics;  Laterality: Left;  I & D of Left Foot   I & D EXTREMITY  07/06/2011   Procedure: IRRIGATION AND DEBRIDEMENT EXTREMITY;  Surgeon: Wylene Simmer, MD;  Location: [place];  Service: Orthopedics;  Laterality: Right;  IRRIGATION/DEBRIDEMENT RIGHT BELOW KNEE AMPUTATION   IR THORACENTESIS ASP PLEURAL SPACE W/IMG GUIDE  09/22/2020   LAPAROSCOPIC SPLENECTOMY N/A 09/18/2020   Procedure: HAND ASSISTED LAPAROSCOPIC SPLENECTOMY;  Surgeon: Stark Klein, MD;  Location: [place];  Service: General;  Laterality: N/A;  [place] LIGATION  2006    Assessment & Plan Clinical Impression: [name] is a 55 year old female with history of  HTN, T2DM, stroke, ICH, urinary retention-foley dependent, chronic hypoxic respiratory failure, R-BKA (CIR 2013), recurrent acute ITP (Dr. Lindi Adie) treated with IVIG/Nplate who was admitted on 09/12/20 with fatigue, Hgb 4.5, WBC 2.8, platelets <50,000 and AKI. Patient had not followed up  with cancer center and run out of lasix X one months. Fluid overload treated with IV diuresis, 2 units PRBC and hyperkalemia treated with lokelma. She was treated with decadron, Nplate and IVIG. Elevated BS treated with insulin and decadron taper.  She was received aranesp and feraheme for supplement and splenectomy recommended by Dr. Marin Olp. She received triple vaccine-PNA/Haemophilus/meningococcus and was agreeable to undergo  splenectomy on 09/15 by Dr. Barry Dienes.    Post op developed leucocytosis with fever up to 102.2 and was started ton Vanc/Cefepime/Flagyl for sepsis due to staph epi bacteremia with left pleural effusion. Pleural effusion tapped 09/19 for 500 cc fluid and cultures negative for infection.  She developed hypoxia with MS changes and CXR showed slight increase in left mid zone opacity. CT chest done 09/21 showing complex partially loculated pleural effusion with airspace consolidation in LLL concerning for PNA and assymetric enlargement of left thyroid lobe. CT abdomen was negative for infection but showed cecal wall thickening and Dr. Carlean Purl recommended colonoscopy for evaluation once medically appropriate or nearing discharge. Thyroid ultrasound showed two left thyroid masses with recommendations for FNA/surgical evaluation after discharge and to follow up with Dr. Verlee Monte for left effusion.     Dr. Gale Journey recommends IV antibiotics thorough 09/26 followed by 3 weeks of Augmentin given effusion.  JP drain remains in place and small hematoma suspected adjacent to incision --JP pulled today. Leucocytosis resolving and felt to be steroid effect. She continues to be limited by weakness and has difficulty with standing. CIR recommended due to functional decline.   Patient currently requires total with mobility secondary to muscle weakness and decreased cardiorespiratoy endurance and decreased oxygen support.  Prior to hospitalization, patient was independent  with mobility and lived with Son in a House home.  Home access is pt states only threshold to enter.Level entry.  Patient will benefit from skilled PT intervention to maximize safe functional mobility, minimize fall risk, and decrease caregiver burden for planned discharge home with intermittent assist.  Anticipate patient will benefit from follow up [facility] at discharge.  PT - End of Session Activity Tolerance: Tolerates 30+ min activity with multiple rests Endurance  Deficit: Yes Endurance Deficit Description: Pt with decreased endurance, especially without supplemental oxyge PT Assessment Rehab Potential (ACUTE/IP ONLY): Good PT Barriers to Discharge: Lack of/limited family support;New oxygen PT Barriers to Discharge Comments: decreased endurance PT Patient demonstrates impairments in the following area(s): Balance;Edema;Safety;Endurance;Motor PT Transfers Functional Problem(s): Bed Mobility;Bed to Chair;Car PT Locomotion Functional Problem(s): Wheelchair Mobility PT Plan PT Intensity: Minimum of 1-2 x/day ,45 to 90 minutes PT Frequency: 5 out of 7 days PT Duration Estimated Length of Stay: 2 weeks PT Treatment/Interventions: Discharge planning;DME/adaptive equipment instruction;Functional mobility training;Therapeutic Activities;UE/LE Strength taining/ROM;Neuromuscular re-education;Patient/family education;Therapeutic Exercise;UE/LE Coordination activities;Wheelchair propulsion/positioning PT Transfers Anticipated Outcome(s): min A PT Locomotion Anticipated Outcome(s): w/c mobility Mod I PT Recommendation Follow Up Recommendations: Home health PT Patient destination: Home Equipment Recommended: To be determined Equipment Details: Pt has commode, w/c.   PT Evaluation Precautions/Restrictions Precautions Precautions: Fall Precaution Comments: R BKA prosthesis not able to don 2/2 edema. Restrictions Weight Bearing Restrictions: No General Chart Reviewed: Yes Family/Caregiver Present: No Vital Signs Pain Pain Assessment Pain Scale: 0-10 Pain Score: 0-No pain (occasional incisional pai, but not at this time.) Pain Interference Pain Interference Pain Effect on Sleep: 0. Does not apply - I have not had any pain or hurting in the past 5 days Pain Interference with Therapy Activities: 0. Does not apply - I have not received rehabilitationtherapy in  the past 5 days Pain Interference with Day-to-Day Activities: 1. Rarely or not at all Home  Living/Prior [place] Living Arrangements: Children Available Help at Discharge: Family;Available PRN/intermittently Type of Home: House Home Access: Level entry Entrance Stairs-Number of Steps: pt states only threshold to enter. Entrance Stairs-Rails: None Home Layout: Two level;Able to live on main level with bedroom/bathroom Bathroom Shower/Tub: Other (comment) (Pt reports having full bath upstairs with tub/shower but reports not bathing upstairs since about 2013 after old BKA and bathes at sink at w/c level) Biochemist, clinical:  (Standard height with BSC over toilet) Bathroom Accessibility: Yes  Lives With: Son Prior Function Level of Independence: Requires assistive device for independence (pt states Prior to recent illness/weakness, able to care for self, son had been helping recently.)  Able to Take Stairs?: No Driving: No Comments: w/c mobility at home, pt states not using prosthesis since 2014? Vision/Perception  Vision - History Ability to See in Adequate Light: 0 Adequate Vision - Assessment Additional Comments: Need to be further assessed for more concrete detail Perception Perception: Within Functional Limits Praxis Praxis: Intact  Cognition Overall Cognitive Status: Within Functional Limits for tasks assessed Arousal/Alertness: Awake/alert Orientation Level: Oriented X4 Year: 2022 Month: September Day of Week: Correct Attention: Divided Divided Attention: Impaired Immediate Memory Recall: Blue;Bed Memory Recall Sock: Not able to recall Memory Recall Blue: Without Cue Memory Recall Bed: Without Cue Awareness: Impaired Problem Solving: Impaired Executive Function: Initiating;Sequencing Sequencing: Impaired Initiating: Impaired Safety/Judgment: Impaired Sensation Sensation Light Touch: Appears Intact Proprioception: Appears Intact Additional Comments: Pt reports light touch feeling the same in both arms but with her last 3 digits on her L hand  they feel numb Coordination Gross Motor Movements are Fluid and Coordinated: Yes Fine Motor Movements are Fluid and Coordinated: Yes Finger Nose Finger Test: To be further assessed Heel Shin Test: R BKA Motor  Motor Motor: Within Functional Limits   Trunk/Postural Assessment  Postural Control Postural Control: Deficits on evaluation Trunk Control: uses UEs for support.  Balance Balance Balance Assessed: Yes Dynamic Sitting Balance Dynamic Sitting - Balance Support: Feet supported;During functional activity Dynamic Sitting - Level of Assistance: 5: Stand by assistance Sitting balance - Comments: Dynamic sitting EOB without LOB, supervision for safety. Extremity Assessment  RUE Assessment RUE Assessment: Within Functional Limits Active Range of Motion (AROM) Comments: WFL General Strength Comments: WFL LUE Assessment LUE Assessment: Within Functional Limits Active Range of Motion (AROM) Comments: WFL General Strength Comments: WFL RLE Assessment RLE Assessment: Within Functional Limits General Strength Comments: for hip and kneeWFL LLE Assessment LLE Assessment: Within Functional Limits General Strength Comments: grossly 4+/5  Care Tool Care Tool Bed Mobility Roll left and right activity        Sit to lying activity        Lying to sitting on side of bed activity   Lying to sitting on side of bed assist level: the ability to move from lying on the back to sitting on the side of the bed with no back support.: Supervision/Verbal cueing     Care Tool Transfers Sit to stand transfer   Sit to stand assist level: Dependent - Patient 0%    Chair/bed transfer   Chair/bed transfer assist level: Total Assistance - Patient < 25%     Psychologist, counselling transfer activity did not occur: Safety/medical concerns        Care Tool Locomotion Ambulation Ambulation activity did not occur: Safety/medical  concerns        Walk 10 feet activity Walk 10  feet activity did not occur: Safety/medical concerns       Walk 50 feet with 2 turns activity Walk 50 feet with 2 turns activity did not occur: Safety/medical concerns      Walk 150 feet activity Walk 150 feet activity did not occur: Safety/medical concerns      Walk 10 feet on uneven surfaces activity Walk 10 feet on uneven surfaces activity did not occur: Safety/medical concerns      Stairs Stair activity did not occur: Safety/medical concerns        Walk up/down 1 step activity Walk up/down 1 step or curb (drop down) activity did not occur: Safety/medical concerns     Walk up/down 4 steps activity did not occuR: Safety/medical concerns  Walk up/down 4 steps activity      Walk up/down 12 steps activity Walk up/down 12 steps activity did not occur: Safety/medical concerns      Pick up small objects from floor Pick up small object from the floor (from standing position) activity did not occur: Safety/medical concerns      Wheelchair Is the patient using a wheelchair?: Yes Type of Wheelchair: Manual   Wheelchair assist level: Supervision/Verbal cueing Max wheelchair distance: 150  Wheel 50 feet with 2 turns activity   Assist Level: Supervision/Verbal cueing  Wheel 150 feet activity   Assist Level: Supervision/Verbal cueing    Refer to Care Plan for Long Term Goals  SHORT TERM GOAL WEEK 1 PT Short Term Goal 1 (Week 1): Pt will transfer sup <> sit w/ mod I. PT Short Term Goal 2 (Week 1): Pt will transfer bed <> w/c w/ max A using slide board. PT Short Term Goal 3 (Week 1): Pt will transfer sit to stand w/ max A  Recommendations for other services: None   Skilled Therapeutic Intervention Evaluation completed (see details above and below) with education on PT POC and goals and individual treatment initiated with focus on endurance, strengthening, transfers. Pt presents semi-reclined in bed and agreeable to therapy although fatigued.  Pt able to transfer sup <> sit w/ CGA.   Pt scooted to EOB w/ CGA.  Pt attempted sit to stand from elevated bed, but unable to clear self from bed.  Pt able to scoot to [facility] w/ min A and attempted lateral scoot bed > w/c to left but unable 2/2 fear? Weakness.  Pt performed squat pivot w/ total A, although pt able to forward lean and uses arm rests of w/c to complete transfer.  Pt negotiated w/c in hallways up to 150' w/ supervision including turn to return to room.  Pt does tend to veer to right.  Pt remained sitting in w/c w/ chair alarm on and all needs in reach.   Mobility Bed Mobility Bed Mobility: Rolling Right;Supine to Sit Rolling Right: Supervision/verbal cueing Rolling Left: Supervision/Verbal cueing Supine to Sit: Contact Guard/Touching assist Sitting - Scoot to Edge of Bed: Contact Guard/Touching assist Sit to Supine: (P) Contact Guard/Touching assist Transfers Transfers: Sit to Stand;Lateral/Scoot Transfers;Squat Pivot Transfers Sit to Stand: Dependent - Patient 0% (attempted to stand from elevated bed, but fear prevents.) Squat Pivot Transfers: Total Assistance - Patient < 25% Lateral/Scoot Transfers: Other/comment (attempted, unable to complete, although able to scoot to [facility].) Transfer (Assistive device): None Locomotion  Gait Ambulation: No Stairs / Additional Locomotion Stairs: No Wheelchair Mobility Wheelchair Mobility: Yes Wheelchair Assistance: Chartered loss adjuster: Both upper extremities  Wheelchair Parts Management: Needs assistance Distance: 150   Discharge Criteria: Patient will be discharged from PT if patient refuses treatment 3 consecutive times without medical reason, if treatment goals not met, if there is a change in medical status, if patient makes no progress towards goals or if patient is discharged from hospital.  The above assessment, treatment plan, treatment alternatives and goals were discussed and mutually agreed upon: by patient  Ladoris Gene 10/03/2020,  12:35 PM

## 2020-10-03 NOTE — Progress Notes (Signed)
F/u- Inpatient Rehabilitation Medication Review by a Pharmacist  A complete drug regimen review was completed for this patient to identify any potential clinically significant medication issues.  High Risk Drug Classes Is patient taking? Indication by Medication  Antipsychotic Yes Compazine prn nausea  Anticoagulant Yes LMWH for DVT prophx.  Antibiotic Yes Augmentin: MRSE bacteremia and PNA  Opioid Yes Oxy prn pain. Source unspecified.  Antiplatelet No   Hypoglycemics/insulin Yes SSI for DM  Vasoactive Medication Yes Imdur, Toprol for CHF, HTN  Chemotherapy No   Other No      Type of Medication Issue Identified Description of Issue Recommendation(s)  Drug Interaction(s) (clinically significant)     Duplicate Therapy     Allergy     No Medication Administration End Date     Incorrect Dose     Additional Drug Therapy Needed  ASA81mg /d Ferrous Gluconate 324mg  BID Metformin 500mg  BID Order these medications  Significant med changes from prior encounter (inform family/care partners about these prior to discharge).    Other       Clinically significant medication issues were identified that warrant physician communication and completion of prescribed/recommended actions by midnight of the next day:  Yes  Name of provider notified for urgent issues identified: , PA  Provider Method of Notification: chat  Pharmacist comments: 9/30 f/u: Dr. Marissa Nestle ok to start Ferrous Gluconate. Con't to hold ASA and metformin due to anemia and AKI.   Time spent performing this drug regimen review (minutes):  15-66min  Neetu Carrozza S. Carlis Abbott, PharmD, BCPS Clinical Staff Pharmacist [email or web address] 30m 10/03/2020 8:11 AM

## 2020-10-04 DIAGNOSIS — R5381 Other malaise: Secondary | ICD-10-CM | POA: Diagnosis not present

## 2020-10-04 LAB — GLUCOSE, CAPILLARY
Glucose-Capillary: 147 mg/dL — ABNORMAL HIGH (ref 70–99)
Glucose-Capillary: 129 mg/dL — ABNORMAL HIGH (ref 70–99)
Glucose-Capillary: 151 mg/dL — ABNORMAL HIGH (ref 70–99)
Glucose-Capillary: 163 mg/dL — ABNORMAL HIGH (ref 70–99)

## 2020-10-04 MED ORDER — WHITE PETROLATUM EX OINT
TOPICAL_OINTMENT | CUTANEOUS | Status: AC
  Filled 2020-10-04: qty 28.35

## 2020-10-04 MED ORDER — METOPROLOL SUCCINATE ER 50 MG PO TB24
75.0000 mg | ORAL_TABLET | Freq: Every day | ORAL | Status: DC
  Administered 2020-10-05 – 2020-10-24 (×20): 75 mg via ORAL
  Filled 2020-10-04 (×21): qty 1

## 2020-10-04 NOTE — Plan of Care (Signed)
  Problem: RH BLADDER ELIMINATION Goal: RH STG MANAGE BLADDER WITH ASSISTANCE Description: STG Manage Bladder With Supervision Assistance Outcome: Not Progressing; I and o cath

## 2020-10-04 NOTE — Progress Notes (Signed)
No void, bladder scan=148cc's. Patient wanted to wait until after breakfast to attempt to void. [name] A

## 2020-10-04 NOTE — Progress Notes (Signed)
Patient had a nose bleed moderate in amount and resolved. Vaseline given for dry nose and humidifier placed in 02

## 2020-10-04 NOTE — Progress Notes (Signed)
PROGRESS NOTE   Subjective/Complaints:  Patient feels well today.  She states that her breathing is good.  She did not wear oxygen at home.  Her goal is to get off oxygen before she goes home  ROS: denies shortness of breath   Objective:   No results found. Recent Labs    10/02/20 0457 10/03/20 0606  WBC 13.3* 12.4*  HGB 8.5* 8.4*  HCT 29.7* 29.6*  PLT 892* 799*    Recent Labs    10/03/20 0606  NA 136  K 4.5  CL 103  CO2 23  GLUCOSE 113*  BUN 24*  CREATININE 1.26*  CALCIUM 8.7*     Intake/Output Summary (Last 24 hours) at 10/04/2020 1153 Last data filed at 10/04/2020 0930 Gross per 24 hour  Intake 940 ml  Output 950 ml  Net -10 ml         Physical Exam: Vital Signs Blood pressure (!) 155/67, pulse 74, temperature 98.8 F (37.1 C), temperature source Oral, resp. rate 18, height 5\' 7"  (1.702 m), weight 108.6 kg, SpO2 93 %.  General: No acute distress Mood and affect are appropriate Heart: Regular rate and rhythm no rubs murmurs or extra sounds Lungs: Clear to auscultation, breathing unlabored, no rales or wheezes Abdomen: Positive bowel sounds, soft nontender to palpation, nondistended Extremities: No clubbing, cyanosis, or edema   Genitourinary:    Comments: Purewick in place Musculoskeletal:     Cervical back: Normal range of motion and neck supple.     Comments: Ues- 4+/5 except 4/5 in FA B/L Les; RLE- 4+/5 in HF/KE/KF- old R BKA LLE- HF 4-/5, distally 4+/5   Assessment/Plan: 1. Functional deficits which require 3+ hours per day of interdisciplinary therapy in a comprehensive inpatient rehab setting. Physiatrist is providing close team supervision and 24 hour management of active medical problems listed below. Physiatrist and rehab team continue to assess barriers to discharge/monitor patient progress toward functional and medical goals  Care Tool:  Bathing    Body parts bathed by  patient: Left arm, Right arm, Abdomen, Chest, Right upper leg, Left upper leg, Face   Body parts bathed by helper: Front perineal area, Buttocks, Left lower leg Body parts n/a: Right lower leg   Bathing assist Assist Level: Maximal Assistance - Patient 24 - 49%     Upper Body Dressing/Undressing Upper body dressing   What is the patient wearing?: Hospital gown only    Upper body assist Assist Level: Set up assist    Lower Body Dressing/Undressing Lower body dressing      What is the patient wearing?: Incontinence brief     Lower body assist Assist for lower body dressing: Moderate Assistance - Patient 50 - 74% Assistive Device Comment: Right prothesis   Toileting Toileting    Toileting assist Assist for toileting: Total Assistance - Patient < 25%     Transfers Chair/bed transfer  Transfers assist     Chair/bed transfer assist level: Maximal Assistance - Patient 25 - 49%     Locomotion Ambulation   Ambulation assist   Ambulation activity did not occur: Safety/medical concerns          Walk 10 feet activity  Assist  Walk 10 feet activity did not occur: Safety/medical concerns        Walk 50 feet activity   Assist Walk 50 feet with 2 turns activity did not occur: Safety/medical concerns         Walk 150 feet activity   Assist Walk 150 feet activity did not occur: Safety/medical concerns         Walk 10 feet on uneven surface  activity   Assist Walk 10 feet on uneven surfaces activity did not occur: Safety/medical concerns         Wheelchair     Assist Is the patient using a wheelchair?: Yes Type of Wheelchair: Manual    Wheelchair assist level: Supervision/Verbal cueing Max wheelchair distance: 150    Wheelchair 50 feet with 2 turns activity    Assist        Assist Level: Supervision/Verbal cueing   Wheelchair 150 feet activity     Assist      Assist Level: Supervision/Verbal cueing   Blood  pressure (!) 155/67, pulse 74, temperature 98.8 F (37.1 C), temperature source Oral, resp. rate 18, height 5\' 7"  (1.702 m), weight 108.6 kg, SpO2 93 %.  Medical Problem List and Plan: 1.  Debility secondary to ITP/s/p splenectomy and old R BKA- hasn't worn her new prosthesis due to fitting issues             -patient may  shower if cover dressing             -ELOS/Goals: 10-14 days- mod I to supervision  CIR PT OT 2.  Antithrombotics: -DVT/anticoagulation:  Pharmaceutical: Lovenox             -antiplatelet therapy: N/A 3. Pain: Decrease Oxycodone to 5mg  q4H prn.  4. Mood: LCSW to follow for evaluation and support.              -antipsychotic agents: N/A 5. Neuropsych: This patient is capable of making decisions on her own behalf. 6. Skin/Wound Care: Monitor wound for healing.   7. Fluids/Electrolytes/Nutrition: Monitor I/O.  Metabolic package shows mild elevation of BUN and creatinine over baseline.,  Albumin remains low but slightly higher than prior now up to 2.5 8. ITP/pancytopenia/anemia:Improving with Hgb-8.4, WBC 13.3 and platelets -892. Will hold off on aspirin.  9. MRSE bacteremia/HAP: IV antibiotics transitioned to Augmentin thru 10/20/20 10. Left pleural effusion s/p thoracotomy 9/19: To follow up with pulmonary after discharge.                   --encourage pulmonary hygiene with flutter valve.  11. Acute on chronic CHF: Lasix decreased to 40 mg bid. Amlodipine d/c.              --monitor I/O. Check weights daily. Monitor for signs of overload.             --continue Toprol XL, Lasix,  12. HTN: Monitor BP TID--continue Toprol, Lasix, Imdur.  Vitals:   10/03/20 1948 10/04/20 0325  BP: (!) 170/135 (!) 155/67  Pulse: 82 74  Resp: 18 18  Temp: 98.5 F (36.9 C) 98.8 F (37.1 C)  SpO2: 95% 93%  Elevated yesterday evening particularly diastolic this morning appears improved although still has some systolic elevation.  Will increase Toprol dose and monitor heart rate 13. T2DM:  Hgb A1C- 5.5 and well controlled on metformin BID PTA             --on hold due to AKI. Will continue to monitor  BS ac/hs and use SSI for now 14. Acute on chronic CKD III: BUN/SCr has normalized to 18/1.0  15. Thyroid nodules: Follow up with [facility] after discharge.  16.  Cecal wall thickening: Will need to contact GI when nearing discharge to determine timing of colonoscopy.  17. Severe obesity BMI 36.95: provide dietary education,  18.  Thrombocytosis will monitor platelets, last value 799 which is slightly lower than the prior value of 892, expect this to remain elevated status post splenectomy  LOS: 2 days A FACE TO FACE EVALUATION WAS PERFORMED  Erick Colace 10/04/2020, 11:53 AM

## 2020-10-04 NOTE — Progress Notes (Signed)
Occupational Therapy Session Note  Patient Details  Name: [name] MRN: [id number] Date of Birth: 05/12/1965  Today's Date: 10/04/2020 OT Individual Time: 1100-1157 OT Individual Time Calculation (min): 57 min    Short Term Goals: Week 1:  OT Short Term Goal 1 (Week 1): Pt will bathe lower body with mod A OT Short Term Goal 2 (Week 1): Pt will dress lower body with max A OT Short Term Goal 3 (Week 1): Pt will complete lateral weight shifting with mod A in preparation for LB dressing OT Short Term Goal 4 (Week 1): Pt will complete toileting tasks with Max A OT Short Term Goal 5 (Week 1): Pt will complete toileting transfer with Max A  Skilled Therapeutic Interventions/Progress Updates:  Pt greeted  supine in bed agreeable to OT intervention. Session focus on  BADL reeducation and functional tranfser training.  Pt completed bed mobility with use of bed features with supervision. Pt completed UB dressing with supervision and  LB dressing with CGA using lateral leans to pull pants up to waist line. MAX A to don L shoe from EOB. Pt reports she has prosthetic but it is new and hasn't been fitting d/t swelling in RLE. Pt completed SB transfer to L side with MOD A. Pt completed w/c propulsion to sink for oral care and face washing with supervision- set- up assist. Pt able to complete w/c propulsion to gym with supervision. Remainder of session to focus on functional SB transfer from w/c<>EOM with pt initially needing MIN A but progressing to CGA with SB placed. Pt completed w/c propulsion back to room with supervision. pt left seated in w/c with safety belt activated and all needs within reach.                    Therapy Documentation Precautions:  Precautions Precautions: Fall Precaution Comments: R BKA prosthesis not able to don 2/2 edema. Restrictions Weight Bearing Restrictions: No   Pain: pt reports unrated pain in her L side. Rest breaks and distraction provided during session as  pain mgmt strategy.      Therapy/Group: Individual Therapy  Barron Schmid 10/04/2020, 12:18 PM

## 2020-10-04 NOTE — Progress Notes (Signed)
Occupational Therapy Session Note  Patient Details  Name: [name] MRN: [id number] Date of Birth: 1965-04-16  Today's Date: 10/04/2020 OT Individual Time: 1300-1331 OT Individual Time Calculation (min): 31 min    Short Term Goals: Week 1:  OT Short Term Goal 1 (Week 1): Pt will bathe lower body with mod A OT Short Term Goal 2 (Week 1): Pt will dress lower body with max A OT Short Term Goal 3 (Week 1): Pt will complete lateral weight shifting with mod A in preparation for LB dressing OT Short Term Goal 4 (Week 1): Pt will complete toileting tasks with Max A OT Short Term Goal 5 (Week 1): Pt will complete toileting transfer with Max A  Skilled Therapeutic Interventions/Progress Updates:  Pt greeted sitting in long sitting in bed sitting on bed pan. Pt able to void bladder, left bed pan in BR to be measured and alerted RN. Pt required MAX A for posterior pericare via rolling R<>L but able to complete anterior pericare with set-up assist from long sitting. Pt able to pull pants up waist line via rolling R<>L with supervision assist. Remainder of session to focus implementing BUE HEP to increase strength and endurance for higher level functional mobility, pt completed therex as indicated below with level 2 theraband. X10 shoulder flexion X10 bicep curls X10 elbow flexio/ extension Pt left supine in bed with bed alarm activated and all needs within reach.   Of note pt on 2L [place] during session, SpO2 drop to 72% needing ~ 1 min of pursed lip breathing to increased SpO2 >90%.   Therapy Documentation Precautions:  Precautions Precautions: Fall Precaution Comments: R BKA prosthesis not able to don 2/2 edema. Restrictions Weight Bearing Restrictions: No  Therapy Vitals Temp: 99.1 F (37.3 C) Pulse Rate: 80 Resp: 20 BP: (!) 166/72 Patient Position (if appropriate): Lying Oxygen Therapy SpO2: 97 % O2 Device: Nasal Cannula O2 Flow Rate (L/min): 2 L/min Pain: pt reports no pain  during session      Therapy/Group: Individual Therapy  Barron Schmid 10/04/2020, 3:32 PM

## 2020-10-04 NOTE — Progress Notes (Signed)
Physical Therapy Session Note  Patient Details  Name: [name] MRN: [id number] Date of Birth: 05-18-1965  Today's Date: 10/04/2020 PT Individual Time: 2130-8657; 8469-6295 PT Individual Time Calculation (min): 57 min and 39 min  Short Term Goals: Week 1:  PT Short Term Goal 1 (Week 1): Pt will transfer sup <> sit w/ mod I. PT Short Term Goal 2 (Week 1): Pt will transfer bed <> w/c w/ max A using slide board. PT Short Term Goal 3 (Week 1): Pt will transfer sit to stand w/ max A  Skilled Therapeutic Interventions/Progress Updates:  Session 1 Pt received supine in bed, head elevated eating breakfast and not wearing O2. Pt denied pain and was agreeable to PT. Noted no safety plan in room. Emphasis of session on bed mobility and transfers. Pt's SpO2 at 72% without nasal cannula donned, nursing notified and therapist donned O2. After 2 minutes, SpO2 at 94% on 2L O2. While performing lower body dressing, noted incontinent episode of bowel and pt rolled to L & R w/min A to remove brief and perform peri care w/total A. Pt continued to have incontinent episode, placed her on bedpan and she sat cross-legged on bedpan w/BUE support and no back support for 10 minutes w/S*. Pt unable to void urine, nursing notified to perform bladder scan. Pt was left seated on bedpan, all needs in reach, nursing present in room. Safety plan obtained and updated.   Session 2  Pt received supine in bed, on 2L of O2 and denied pain. Emphasis of session on anterior weight shift to prep for sit <>stand transfers. Supine <>sit EOB w/CGA and lateral board transfer to R side w/min A. Pt unable to recall how to set up transfer, max cues throughout for hand-hip relationship, hand placement and sequencing. Pt self-propelled 150' from room to main gym w/supervision. Educated pt on anterior weight shift using NDT principles, pt practiced anterior shift 5 times but unable to fully comprehend what she was doing. Pt requires lots of  repetition and time to process information. In // bars, attempted 6 sit <>stands w/max A and BUE support on rails, unable to achieve full stand but pt did clear hips off WC 3 times. Pt self-propelled back to room w/S* and was left seated in [facility] w/all needs in reach.   Therapy Documentation Precautions:  Precautions Precautions: Fall Precaution Comments: R BKA prosthesis not able to don 2/2 edema. Restrictions Weight Bearing Restrictions: No   Therapy/Group: Individual Therapy Jill Alexanders Davione Lenker, PT, DPT  10/04/2020, 7:43 AM

## 2020-10-04 NOTE — Progress Notes (Signed)
+/-   sleep. PRN oxy IR 5mg 's given at 2051 for complaint of left flank pain. At 2330, incontinent void, bladder scan=429. I & O cath =650cc's. Wearing stump shrinker to old right BKA. Callous area to ball of left foot. LLE edema. 2331 A

## 2020-10-05 LAB — GLUCOSE, CAPILLARY
Glucose-Capillary: 143 mg/dL — ABNORMAL HIGH (ref 70–99)
Glucose-Capillary: 125 mg/dL — ABNORMAL HIGH (ref 70–99)
Glucose-Capillary: 142 mg/dL — ABNORMAL HIGH (ref 70–99)
Glucose-Capillary: 157 mg/dL — ABNORMAL HIGH (ref 70–99)

## 2020-10-05 NOTE — IPOC Note (Signed)
Overall Plan of Care [facility]) Patient Details Name: [name] MRN: [id number] DOB: [birth year]  Admitting Diagnosis: Debility  Hospital Problems: Principal Problem:   Debility     Functional Problem List: Nursing Bladder, Bowel, Edema, Endurance, Medication Management, Safety, Sensory, Skin Integrity  PT Balance, Edema, Safety, Endurance, Motor  OT Balance, Behavior, Endurance, Motor, Pain, Safety, Sensory  SLP    TR         Basic ADL's: OT Grooming, Bathing, Dressing, Toileting     Advanced  ADL's: OT Simple Meal Preparation     Transfers: PT Bed Mobility, Bed to Chair, Car  OT Toilet, Tub/Shower     Locomotion: PT Wheelchair Mobility     Additional Impairments: OT    SLP        TR      Anticipated Outcomes Item Anticipated Outcome  Self Feeding Set up A  Swallowing      Basic self-care  Min-Mod A  Toileting  Mod A   Bathroom Transfers Min A  Bowel/Bladder  supervision  Transfers  min A  Locomotion  w/c mobility Mod I  Communication     Cognition     Pain  n/a  Safety/Judgment  supervision and no falls   Therapy Plan: PT Intensity: Minimum of 1-2 x/day ,45 to 90 minutes PT Frequency: 5 out of 7 days PT Duration Estimated Length of Stay: 2 weeks OT Intensity: Minimum of 1-2 x/day, 45 to 90 minutes OT Frequency: 5 out of 7 days OT Duration/Estimated Length of Stay: 2 weeks     Due to the current state of emergency, patients may not be receiving their 3-hours of Medicare-mandated therapy.   Team Interventions: Nursing Interventions Patient/Family Education, Bladder Management, Bowel Management, Disease Management/Prevention, Pain Management, Medication Management, Skin Care/Wound Management, Discharge Planning  PT interventions Discharge planning, DME/adaptive equipment instruction, Functional mobility training, Therapeutic Activities, UE/LE Strength taining/ROM, Neuromuscular re-education, Patient/family education, Therapeutic  Exercise, UE/LE Coordination activities, Wheelchair propulsion/positioning  OT Interventions Balance/vestibular training, Discharge planning, Pain management, Self Care/advanced ADL retraining, Therapeutic Activities, Functional mobility training, Patient/family education, Skin care/wound managment, Therapeutic Exercise, Community reintegration, DME/adaptive equipment instruction, Psychosocial support, UE/LE Strength taining/ROM, Wheelchair propulsion/positioning  SLP Interventions    TR Interventions    SW/CM Interventions Discharge Planning, Psychosocial Support, Patient/Family Education   Barriers to Discharge MD  Medical stability, Home enviroment access/loayout, Wound care, Lack of/limited family support, Weight, and Weight bearing restrictions  Nursing Decreased caregiver support, Home environment access/layout, Incontinence, Wound Care, Lack of/limited family support, Weight, Medication compliance Lives in a 1 level home with 1 step to enter and no rail. Lives with 18 yr son and mom [place] frequently from [place]. Son able to provide intermittent supervision at discharge.  PT Lack of/limited family support, New oxygen decreased endurance  OT Inaccessible home environment, Decreased caregiver support, Home environment access/layout, Incontinence, Wound Care, Lack of/limited family support, Weight    SLP      SW Decreased caregiver support, Insurance for SNF coverage     Team Discharge Planning: Destination: PT-Home ,OT- Home , SLP-  Projected Follow-up: PT-Home health PT, OT-  Home health OT, SLP-  Projected Equipment Needs: PT-To be determined, OT- To be determined, SLP-  Equipment Details: PT-Pt has commode, w/c., OT-Pt reports having a w/c, RW, and BSC at home. However unclear on if pt has drop arm BSC, and if it'll be needed depending on how transfers progress during CIR Patient/family involved in discharge planning: PT- Patient,  OT-Patient, SLP-  MD ELOS: 12-14 days Medical Rehab  Prognosis:  Fair Assessment: Pt is a 55 yr old female with old R BKA with new prosthesis0= hasn't worn- and ITB s/p splnectomy and resulting debility- acute on chronic CHF, MRSE bacteremia /HAP; DM; and thrombocytosis.   Goals min-mod A    See Team Conference Notes for weekly updates to the plan of care

## 2020-10-05 NOTE — Plan of Care (Signed)
  Problem: RH BLADDER ELIMINATION Goal: RH STG MANAGE BLADDER WITH ASSISTANCE Description: STG Manage Bladder With Supervision Assistance Outcome: Not Progressing; bladder scan ; I and o cath

## 2020-10-05 NOTE — Plan of Care (Signed)
  Problem: RH BOWEL ELIMINATION Goal: RH STG MANAGE BOWEL WITH ASSISTANCE Description: STG Manage Bowel with Supervision Assistance. Flowsheets (Taken 10/05/2020 0423) STG: Pt will manage bowels with assistance: 4-Minimum assistance   Problem: RH BLADDER ELIMINATION Goal: RH STG MANAGE BLADDER WITH ASSISTANCE Description: STG Manage Bladder With Supervision Assistance Flowsheets (Taken 10/05/2020 0424) STG: Pt will manage bladder with assistance: 3-Moderate assistance   Problem: RH SKIN INTEGRITY Goal: RH STG MAINTAIN SKIN INTEGRITY WITH ASSISTANCE Description: STG Maintain Skin Integrity With Supervision Assistance. Flowsheets (Taken 10/05/2020 0424) STG: Maintain skin integrity with assistance: 4-Minimal assistance Goal: RH STG ABLE TO PERFORM INCISION/WOUND CARE W/ASSISTANCE Description: STG Able To Perform Incision/Wound Care With Supervision Assistance. Flowsheets (Taken 10/05/2020 0424) STG: Pt will be able to perform incision/wound care with assistance: 4-Minimal assistance   Problem: RH SAFETY Goal: RH STG ADHERE TO SAFETY PRECAUTIONS W/ASSISTANCE/DEVICE Description: STG Adhere to Safety Precautions With Supervision Assistance/Device. Flowsheets (Taken 10/05/2020 0424) STG:Pt will adhere to safety precautions with assistance/device: 5-Supervision/set up

## 2020-10-05 NOTE — Progress Notes (Signed)
Slept good. PRN oxy IR 5 mg's given at 2032. Complained of left flank pain. Low grade temps. This shift. @ 0010, bladder scan=214, no void. 0300 patient incont of B&B. Patient asleep, unaware of incontinence. Bladder scan=112cc's. Dry cough noted. Patient reports cough is new since admission to hospital. Encouraged I.S. Wears 02 intermittently. LLE (+) edema. Callous to ball of left foot. Keeps stump shrinker in place to Right BKA. Foam dressing to left flank. [name]

## 2020-10-06 ENCOUNTER — Inpatient Hospital Stay: Payer: Medicare Other | Admitting: Hematology and Oncology

## 2020-10-06 ENCOUNTER — Inpatient Hospital Stay: Payer: Medicare Other

## 2020-10-06 DIAGNOSIS — R5381 Other malaise: Secondary | ICD-10-CM | POA: Diagnosis not present

## 2020-10-06 LAB — GLUCOSE, CAPILLARY
Glucose-Capillary: 128 mg/dL — ABNORMAL HIGH (ref 70–99)
Glucose-Capillary: 143 mg/dL — ABNORMAL HIGH (ref 70–99)
Glucose-Capillary: 158 mg/dL — ABNORMAL HIGH (ref 70–99)
Glucose-Capillary: 154 mg/dL — ABNORMAL HIGH (ref 70–99)

## 2020-10-06 LAB — CBC
HCT: 30.5 % — ABNORMAL LOW (ref 36.0–46.0)
Hemoglobin: 8.9 g/dL — ABNORMAL LOW (ref 12.0–15.0)
MCH: 25.4 pg — ABNORMAL LOW (ref 26.0–34.0)
MCHC: 29.2 g/dL — ABNORMAL LOW (ref 30.0–36.0)
MCV: 86.9 fL (ref 80.0–100.0)
Platelets: 635 10*3/uL — ABNORMAL HIGH (ref 150–400)
RBC: 3.51 MIL/uL — ABNORMAL LOW (ref 3.87–5.11)
RDW: 24.4 % — ABNORMAL HIGH (ref 11.5–15.5)
WBC: 10.5 10*3/uL (ref 4.0–10.5)
nRBC: 0.3 % — ABNORMAL HIGH (ref 0.0–0.2)

## 2020-10-06 LAB — BASIC METABOLIC PANEL
Anion gap: 13 (ref 5–15)
BUN: 34 mg/dL — ABNORMAL HIGH (ref 6–20)
CO2: 20 mmol/L — ABNORMAL LOW (ref 22–32)
Calcium: 8.7 mg/dL — ABNORMAL LOW (ref 8.9–10.3)
Chloride: 99 mmol/L (ref 98–111)
Creatinine, Ser: 1.97 mg/dL — ABNORMAL HIGH (ref 0.44–1.00)
GFR, Estimated: 30 mL/min — ABNORMAL LOW (ref >60)
Glucose, Bld: 128 mg/dL — ABNORMAL HIGH (ref 70–99)
Potassium: 4.4 mmol/L (ref 3.5–5.1)
Sodium: 132 mmol/L — ABNORMAL LOW (ref 135–145)

## 2020-10-06 MED ORDER — FUROSEMIDE 20 MG PO TABS
20.0000 mg | ORAL_TABLET | Freq: Two times a day (BID) | ORAL | Status: DC
  Administered 2020-10-06 – 2020-10-09 (×6): 20 mg via ORAL
  Filled 2020-10-06 (×6): qty 1

## 2020-10-06 MED ORDER — OXYCODONE HCL 5 MG PO TABS
5.0000 mg | ORAL_TABLET | Freq: Four times a day (QID) | ORAL | Status: DC | PRN
Start: 2020-10-06 — End: 2020-10-10
  Administered 2020-10-06 – 2020-10-09 (×2): 5 mg via ORAL
  Filled 2020-10-06 (×2): qty 1

## 2020-10-06 NOTE — Progress Notes (Signed)
Physical Therapy Session Note  Patient Details  Name: [name] MRN: [id number] Date of Birth: Jun 03, 1965  Today's Date: 10/06/2020 PT Individual Time: 1015-1126 PT Individual Time Calculation (min): 71 min   Short Term Goals: Week 1:  PT Short Term Goal 1 (Week 1): Pt will transfer sup <> sit w/ mod I. PT Short Term Goal 2 (Week 1): Pt will transfer bed <> w/c w/ max A using slide board. PT Short Term Goal 3 (Week 1): Pt will transfer sit to stand w/ max A  Skilled Therapeutic Interventions/Progress Updates:   Received pt semi-reclined in bed, pt agreeable to PT treatment, and reported pain 6/10 in L hip. RN notified and present to administer pain medication. Session with emphasis on functional mobility/transfers, generalized strengthening, standing tolerance, and improved endurance with activity. Pt on 2L O2 with O2 sat 94% at rest and remained >94% throughout session. Pt transferred semi-reclined<>sitting EOB with supervision and donned shoe with max A. Pt transferred bed<>WC via slideboard to R with min/mod A and multimodal cues for sequencing. Pt performed WC mobility 133ft x 1 using BUE and supervision to main therapy gym and transferred WC<>mat via slideboard to R with min A. Provided pt with appropriate height RW and attempted sit<>stands from extremely elevated mat with RW x 4 attempts with max/total A, however pt unable to come into standing position and barely able to clear buttocks. Tried placing LUE on mat and RUE on RW rather than pushing up with both UEs on RW, but pt still unable to stand despite cues to get LLE underneath her, for anterior weight shifting, and to push up tall through LLE. Pt was able to perform sit<>stand x 1 with +2 assist using 3 musketeer assist with second person guarding L knee. Pt able to stand ~15 seconds prior to sitting due to reports of L knee "giving out". Pt then performed the following exercises sitting on mat with supervision and verbal cues for  technique with emphasis on strength and ROM: -bicep curls with 3lb dowel 2x15 -overhead chest press with 3lb dowel 2x10 -horizontal chest press at 90 degrees with 3lb dowel 2x10 -LAQ 2x12 bilaterally with 2lb ankle weight on LLE -hip flexion 2x10 bilaterally with 2lb ankle weight on LLE Pt transferred mat<>WC via slideboard to L with CGA with total A to place board and transported back to room in [facility] total A. Concluded session with pt sitting in WC, needs within reach, and seatbelt alarm on. Provided pt with fresh drink.   Therapy Documentation Precautions:  Precautions Precautions: Fall Precaution Comments: R BKA prosthesis not able to don 2/2 edema. Restrictions Weight Bearing Restrictions: No  Therapy/Group: Individual Therapy Blackwelder Majestic PT, DPT   10/06/2020, 7:28 AM

## 2020-10-06 NOTE — Progress Notes (Signed)
+/-   sleep per patient. PRN oxy IR 5mg 's given at 1955 for c/o left flank/lower abd discomfort. Erythema noted to left flank abd. Foam dressing in place. Continues with dry cough. Patient removed O2, spot check on RA=82%. O2 reapplied and reminded patient to keep O2 on. Generalized pitting edema all over. Weight trending up. At 2345, no void, I & O cath=400cc's. At 0620, Incontinent of B & B. Stool mushy, bladder scan=226cc. No Cath needed at this time. 0621 A

## 2020-10-06 NOTE — Progress Notes (Signed)
Pt said she felt the urge to go and attempted to urinate. Pt unable to urinate. Patient still feeling strong urge to go so bladder scan was performed. Pt scanned for 443. Nurse advised patient that it was better to go ahead and cath due to high volume. Nurse educated patient and strongly urged patient to allow [place] to cath her. Patient ultimately refused and asked to be cathed later because she wanted to eat prior to her 1pm therapy session. Will attempt again after therapy session.

## 2020-10-06 NOTE — Progress Notes (Signed)
PROGRESS NOTE   Subjective/Complaints: No complaints this morning Had a good weekend  ROS: denies shortness of breath   Objective:   No results found. Recent Labs    10/06/20 0458  WBC 10.5  HGB 8.9*  HCT 30.5*  PLT 635*   Recent Labs    10/06/20 0458  NA 132*  K 4.4  CL 99  CO2 20*  GLUCOSE 128*  BUN 34*  CREATININE 1.97*  CALCIUM 8.7*    Intake/Output Summary (Last 24 hours) at 10/06/2020 1221 Last data filed at 10/06/2020 0937 Gross per 24 hour  Intake 840 ml  Output 400 ml  Net 440 ml        Physical Exam: Vital Signs Blood pressure (!) 153/75, pulse 70, temperature 98.2 F (36.8 C), temperature source Oral, resp. rate 18, height 5\' 7"  (1.702 m), weight 110.6 kg, SpO2 97 %.  General: No acute distress Mood and affect are appropriate Heart: Regular rate and rhythm no rubs murmurs or extra sounds Lungs: Clear to auscultation, breathing unlabored, no rales or wheezes Abdomen: Positive bowel sounds, soft nontender to palpation, nondistended Extremities: No clubbing, cyanosis, or edema   Genitourinary:    Comments: Purewick in place Musculoskeletal:     Cervical back: Normal range of motion and neck supple.     Comments: Ues- 4+/5 except 4/5 in FA B/L Les; RLE- 4+/5 in HF/KE/KF- old R BKA LLE- HF 4-/5, distally 4+/5   Assessment/Plan: 1. Functional deficits which require 3+ hours per day of interdisciplinary therapy in a comprehensive inpatient rehab setting. Physiatrist is providing close team supervision and 24 hour management of active medical problems listed below. Physiatrist and rehab team continue to assess barriers to discharge/monitor patient progress toward functional and medical goals  Care Tool:  Bathing    Body parts bathed by patient: Face, Right arm, Left arm, Chest, Abdomen   Body parts bathed by helper: Front perineal area, Buttocks Body parts n/a: Right lower leg    Bathing assist Assist Level: Maximal Assistance - Patient 24 - 49%     Upper Body Dressing/Undressing Upper body dressing   What is the patient wearing?: Pull over shirt    Upper body assist Assist Level: Supervision/Verbal cueing    Lower Body Dressing/Undressing Lower body dressing      What is the patient wearing?: Pants     Lower body assist Assist for lower body dressing: Moderate Assistance - Patient 50 - 74% Assistive Device Comment: Rolling R<>L supine at bed level   Toileting Toileting    Toileting assist Assist for toileting: Total Assistance - Patient < 25%     Transfers Chair/bed transfer  Transfers assist     Chair/bed transfer assist level: Minimal Assistance - Patient > 75% (slideboard)     Locomotion Ambulation   Ambulation assist   Ambulation activity did not occur: Safety/medical concerns          Walk 10 feet activity   Assist  Walk 10 feet activity did not occur: Safety/medical concerns        Walk 50 feet activity   Assist Walk 50 feet with 2 turns activity did not occur: Safety/medical concerns  Walk 150 feet activity   Assist Walk 150 feet activity did not occur: Safety/medical concerns         Walk 10 feet on uneven surface  activity   Assist Walk 10 feet on uneven surfaces activity did not occur: Safety/medical concerns         Wheelchair     Assist Is the patient using a wheelchair?: Yes Type of Wheelchair: Manual    Wheelchair assist level: Supervision/Verbal cueing Max wheelchair distance: 150    Wheelchair 50 feet with 2 turns activity    Assist        Assist Level: Supervision/Verbal cueing   Wheelchair 150 feet activity     Assist      Assist Level: Supervision/Verbal cueing   Blood pressure (!) 153/75, pulse 70, temperature 98.2 F (36.8 C), temperature source Oral, resp. rate 18, height 5\' 7"  (1.702 m), weight 110.6 kg, SpO2 97 %.  Medical Problem List  and Plan: 1.  Debility secondary to ITP/s/p splenectomy and old R BKA- hasn't worn her new prosthesis due to fitting issues             -patient may  shower if cover dressing             -ELOS/Goals: 10-14 days- mod I to supervision  Continue CIR PT OT 2.  Antithrombotics: -DVT/anticoagulation:  Pharmaceutical: Lovenox             -antiplatelet therapy: N/A 3. Pain: Decrease Oxycodone to 5mg  q6H prn.  4. Mood: LCSW to follow for evaluation and support.              -antipsychotic agents: N/A 5. Neuropsych: This patient is capable of making decisions on her own behalf. 6. Skin/Wound Care: Monitor wound for healing.   7. Fluids/Electrolytes/Nutrition: Monitor I/O.  Metabolic package shows mild elevation of BUN and creatinine over baseline.,  Albumin remains low but slightly higher than prior now up to 2.5 8. ITP/pancytopenia/anemia:Improving with Hgb-8.4, WBC 13.3 and platelets -892. Will hold off on aspirin.  9. MRSE bacteremia/HAP: IV antibiotics transitioned to Augmentin thru 10/20/20 10. Left pleural effusion s/p thoracotomy 9/19: To follow up with pulmonary after discharge.                   --encourage pulmonary hygiene with flutter valve.  11. Acute on chronic CHF: Lasix decreased to 40 mg bid. Amlodipine d/c.              --monitor I/O. Check weights daily. Monitor for signs of overload.             --continue Toprol XL, Lasix,  12. HTN: Monitor BP TID--continue Toprol, Lasix, Imdur. BP labile: continue to monitor.  Vitals:   10/05/20 1931 10/06/20 0358  BP: (!) 113/53 (!) 153/75  Pulse: 73 70  Resp: 18 18  Temp: 98.7 F (37.1 C) 98.2 F (36.8 C)  SpO2: 91% 97%   13. T2DM: Hgb A1C- 5.5 and well controlled on metformin BID PTA             --on hold due to AKI. Will continue to monitor BS ac/hs and use SSI for now  CBGs 128-157: provide dietary education.  14. Acute on chronic CKD III: BUN/SCr had normalized to 18/1.0 , now Cr up to 1.97. Decrease Lasix to 20mg  BID 15. Thyroid  nodules: Follow up with [facility] after discharge.  16.  Cecal wall thickening: Will need to contact GI when nearing discharge to determine timing  of colonoscopy.  17. Severe obesity BMI 36.95: provide dietary education,  18.  Thrombocytosis will monitor platelets, last value 799 which is slightly lower than the prior value of 892, expect this to remain elevated status post splenectomy 19. Disposition: Messaged April to schedule HFU.  LOS: 4 days A FACE TO FACE EVALUATION WAS PERFORMED  Clint Bolder P [place] 10/06/2020, 12:21 PM

## 2020-10-06 NOTE — Progress Notes (Signed)
Occupational Therapy Session Note  Patient Details  Name: [name] MRN: [id number] Date of Birth: February 27, 1965  Today's Date: 10/06/2020 OT Individual Time: 5638-7564 and 13:02-14:05 OT Individual Time Calculation (min): 62 min and 63 min     Short Term Goals: Week 1:  OT Short Term Goal 1 (Week 1): Pt will bathe lower body with mod A OT Short Term Goal 2 (Week 1): Pt will dress lower body with max A OT Short Term Goal 3 (Week 1): Pt will complete lateral weight shifting with mod A in preparation for LB dressing OT Short Term Goal 4 (Week 1): Pt will complete toileting tasks with Max A OT Short Term Goal 5 (Week 1): Pt will complete toileting transfer with Max A  Skilled Therapeutic Interventions/Progress Updates:   Treatment session 1: Skilled OT intervention completed with focus on ADL retraining and dynamic sitting balance. Pt received in long sitting in bed finishing breakfast without [place] donned. Therapist assessed O2 with pt at 94 with [place] donned, and pt encouraged to wear oxygen and pt reporting "I don't need it all the time, the point is to come off of it so I need to start working towards that." Pt educated on the need for wearing [place], especially during activities that require more energy exertion. Pt agreeable to therapy session. Pt transitioned from in bed > seated EOB with supervision, then pt completed UB bathing and dressing with supervision. Pt required intermittent cues to sequence task as pt demonstrated need for increased time to process. Pt transitioned back to long sitting in bed with supervision, where pt was able to thread pants over BLEs. Pt expressed the need to use bathroom, so pt rolled > R for therapist to position bed pan with pt requesting to sit upright on bedpan versus lying down. Bed pan removed, with delayed BM, so therapist provided perineal care at bed level with total assist, as well as donning brief with pt able to roll L<>R, then required mod A for  donning pants over hips while supine in bed. Pt returned to long sitting in bed, with bed alarm activated, and all needs within reach at end of session.  Treatment session 2: Skilled OT intervention completed with focus on functional transfers, including bed <> w/c with slide board, and sit <> stands facing parallel bars to improve independence with lower body ADL management. Pt received in long sitting in bed, and agreeable to session. Pt transferred from bed > w/c using slide board and min-mod A. Transported to therapy gym, with pt set up seated in w/c in front of parallel bars, where pt completed 5 sit > stands with mod assist x2,and one therapist providing blocking of L knee due to pt's report of feeling like the knee buckles in standing. Pt demonstrated improved ability to stand with cues needed for bringing hips anteriorly towards parallel bar as well as cues to utilize/look at herself in wall mirror in front of her to promote a more upright standing posture. Transported back to pt's room, with pt request to use bed pan, so pt transferred slide board transferred w/c > bed with mod A. Set pt up on bed pan, with bed alarm activated with call bell in reach and notified pt to call for NT if finished toileting before NT arrived. Therapist alerted RN about pt's need for assistance, at end of session.  Therapy Documentation Precautions:  Precautions Precautions: Fall Precaution Comments: R BKA prosthesis not able to don 2/2 edema. Restrictions Weight Bearing  Restrictions: No  Pain: Pt with no c/o pain during session.   Therapy/Group: Individual Therapy  Hope E Poindexter 10/06/2020, 11:59 AM

## 2020-10-07 DIAGNOSIS — R5381 Other malaise: Secondary | ICD-10-CM | POA: Diagnosis not present

## 2020-10-07 LAB — URINALYSIS, ROUTINE W REFLEX MICROSCOPIC
Bilirubin Urine: NEGATIVE
Glucose, UA: NEGATIVE mg/dL
Hgb urine dipstick: NEGATIVE
Ketones, ur: NEGATIVE mg/dL
Nitrite: NEGATIVE
Protein, ur: 30 mg/dL — AB
Specific Gravity, Urine: 1.009 (ref 1.005–1.030)
pH: 6 (ref 5.0–8.0)

## 2020-10-07 LAB — CBC WITH DIFFERENTIAL/PLATELET
Abs Immature Granulocytes: 0.05 10*3/uL (ref 0.00–0.07)
Basophils Absolute: 0.1 10*3/uL (ref 0.0–0.1)
Basophils Relative: 1 %
Eosinophils Absolute: 0.4 10*3/uL (ref 0.0–0.5)
Eosinophils Relative: 3 %
HCT: 29.5 % — ABNORMAL LOW (ref 36.0–46.0)
Hemoglobin: 8.4 g/dL — ABNORMAL LOW (ref 12.0–15.0)
Immature Granulocytes: 0 %
Lymphocytes Relative: 12 %
Lymphs Abs: 1.5 10*3/uL (ref 0.7–4.0)
MCH: 24.9 pg — ABNORMAL LOW (ref 26.0–34.0)
MCHC: 28.5 g/dL — ABNORMAL LOW (ref 30.0–36.0)
MCV: 87.5 fL (ref 80.0–100.0)
Monocytes Absolute: 1.7 10*3/uL — ABNORMAL HIGH (ref 0.1–1.0)
Monocytes Relative: 14 %
Neutro Abs: 8.1 10*3/uL — ABNORMAL HIGH (ref 1.7–7.7)
Neutrophils Relative %: 70 %
Platelets: 712 10*3/uL — ABNORMAL HIGH (ref 150–400)
RBC: 3.37 MIL/uL — ABNORMAL LOW (ref 3.87–5.11)
RDW: 24.4 % — ABNORMAL HIGH (ref 11.5–15.5)
WBC: 11.8 10*3/uL — ABNORMAL HIGH (ref 4.0–10.5)
nRBC: 0.2 % (ref 0.0–0.2)

## 2020-10-07 LAB — BASIC METABOLIC PANEL
Anion gap: 9 (ref 5–15)
BUN: 35 mg/dL — ABNORMAL HIGH (ref 6–20)
CO2: 22 mmol/L (ref 22–32)
Calcium: 8.7 mg/dL — ABNORMAL LOW (ref 8.9–10.3)
Chloride: 100 mmol/L (ref 98–111)
Creatinine, Ser: 1.61 mg/dL — ABNORMAL HIGH (ref 0.44–1.00)
GFR, Estimated: 38 mL/min — ABNORMAL LOW (ref >60)
Glucose, Bld: 103 mg/dL — ABNORMAL HIGH (ref 70–99)
Potassium: 4.6 mmol/L (ref 3.5–5.1)
Sodium: 131 mmol/L — ABNORMAL LOW (ref 135–145)

## 2020-10-07 LAB — GLUCOSE, CAPILLARY
Glucose-Capillary: 155 mg/dL — ABNORMAL HIGH (ref 70–99)
Glucose-Capillary: 105 mg/dL — ABNORMAL HIGH (ref 70–99)
Glucose-Capillary: 138 mg/dL — ABNORMAL HIGH (ref 70–99)
Glucose-Capillary: 159 mg/dL — ABNORMAL HIGH (ref 70–99)

## 2020-10-07 MED ORDER — NITROFURANTOIN MONOHYD MACRO 100 MG PO CAPS
100.0000 mg | ORAL_CAPSULE | Freq: Two times a day (BID) | ORAL | Status: DC
  Administered 2020-10-07 – 2020-10-08 (×2): 100 mg via ORAL
  Filled 2020-10-07 (×2): qty 1

## 2020-10-07 MED ORDER — RISAQUAD PO CAPS
2.0000 | ORAL_CAPSULE | Freq: Three times a day (TID) | ORAL | Status: DC
  Administered 2020-10-07 – 2020-10-23 (×50): 2 via ORAL
  Filled 2020-10-07 (×51): qty 2

## 2020-10-07 NOTE — Progress Notes (Signed)
Occupational Therapy Session Note  Patient Details  Name: [name] MRN: [id number] Date of Birth: [birth year]  Today's Date: 10/07/2020 OT Individual Time: 7342-8768 OT Individual Time Calculation (min): 57 min    Short Term Goals: Week 1:  OT Short Term Goal 1 (Week 1): Pt will bathe lower body with mod A OT Short Term Goal 2 (Week 1): Pt will dress lower body with max A OT Short Term Goal 3 (Week 1): Pt will complete lateral weight shifting with mod A in preparation for LB dressing OT Short Term Goal 4 (Week 1): Pt will complete toileting tasks with Max A OT Short Term Goal 5 (Week 1): Pt will complete toileting transfer with Max A  Skilled Therapeutic Interventions/Progress Updates:  Skilled OT intervention completed with focus on ADL retraining, including bathing and dressing skills at EOB. Pt received in bed finishing morning meds and breakfast with RN present. Pt motivated to participate in self-care, transitioning from long sitting in bed > seated EOB with supervision. Pt completed UB bathing and dressing tasks with supervision, and with min verbal cues needed to doff [place] prior to threading bra/shirt over head and then to donn [place] back on. Pt opted to donn pants while seated at EOB vs at bed level, demonstrating improved ability to bring L foot into figure four position for threading pant legs over BLE, with CGA needed for safety and balance during weight shifts. Pt transitioned EOB > long sitting in bed with min A for positioning of the legs, due to decreased space to abduct legs during transfer with pants waistline around thighs. Pt educated on why there was an increased challenge with transferring with pants partially on when pt responded "this is difficult for some reason." Pt using rolling L<>R and bridge technique through LLE was able to donn waist line of pants over hips with min-mod A. Verbal cues needed to help pt initiate rolling, and tactile cues beneficial for placement of  LLE during bridging. Pt left in long sitting, repositioned, with bed alarm activated and all needs in reach at end of session.  Therapy Documentation Precautions:  Precautions Precautions: Fall Precaution Comments: R BKA prosthesis not able to don 2/2 edema. Restrictions Weight Bearing Restrictions: No  Pain: Pt with no complaints of pain during today's session.   Therapy/Group: Individual Therapy  Hope E Poindexter 10/07/2020, 10:54 AM

## 2020-10-07 NOTE — Progress Notes (Signed)
Physical Therapy Session Note  Patient Details  Name: [name] MRN: [id number] Date of Birth: 1965/07/05  Today's Date: 10/07/2020 PT Individual Time: 1050-1200 and 1400-1410 and 1500-1530 PT Individual Time Calculation (min): 70 min and 10 min and 30 min   Short Term Goals: Week 1:  PT Short Term Goal 1 (Week 1): Pt will transfer sup <> sit w/ mod I. PT Short Term Goal 2 (Week 1): Pt will transfer bed <> w/c w/ max A using slide board. PT Short Term Goal 3 (Week 1): Pt will transfer sit to stand w/ max A Week 2:    Week 3:     Skilled Therapeutic Interventions/Progress Updates:    AM SESSION Pt initially supine.  When asked about pain pt state "I am all right"  Supine to sit w/supervision, additional time, use of rails.  Scoots w/encouragement/supervision.  Total assist to don shoe. sliding board transfer bed to wc w/set up and cga. Wc propulsion 130ft w/cues for encouragement only. Wc to mat sliding board transfer w/set up and assist oonly to stabilize wc/sliding board.  Worked on [employer] wt shift/boosting using [place] from elevated [employer] used for pt sense of security w/ant lean. Pt gradually able to partially unweight bottom from mat w/multimodal cues to promote mechanics w/transition, unable to fully unweight or progress to stand due to weakness and mechanics.  Seated trunk rotation w/red weighted ball 2x 20 Seated overhead lift w/red weighted ball 2 x 15 Boositng from mat in sitting using 5in step on each side of pt, cues for sequencing, 10 reps, 2 sets, improved mechanics w/repetition. Mat to wc sliding board transfer w/assist to place board, assist to stabilize wc, no other assist required.  Wc propulsion 128ft w/supervision, slow but steady progression.   Pt left oob in wc w/alarm belt set and needs in reach  PM SESSION 1 Pt reported need for use of BR.  Declined transfer to [facility], annoyed w/therapist for suggesting, states "you know I already went in my pants".  Pt  agreed to therapist assisting her w/transfer to bed.  sliding board transfer wc to bed w/set up, instruction for board placement, therapist stabilizing wc, cues for sequencing.  Sit to supine w/supervision.  Pt refused to allow therapist to assist w/brief change.  Repeated "get [name], she knows what she is doing."  Insists on NT and not PT to assist.  NT called an in to assist pt.  PM session 2 Pt supine, states "It's 3:00, you want me to do therapy now?"  Pt suggested 30 min session and pt agreeable.   Dons pants w/set up, encouragement to perform herself, min assist in bed via rolling and bridging.  Supine to sit w/supervision.   Bed to wc sliding board transfer w/cues for safe board placement, pt unable to place board despite attempt, therapist places board and discusses proper alignment.  Transferes bed to wc w/cues for sequencing, therapist stabilizing wc and board, cga.  02 sats on 2L 97%, HR 70-72  placed on 1L 02 for activity/assess tolerance. Pt propelled wc >173ft w/bilat Ues and LLE. HR 73, 02 sats 94% on 1L.    Pt returned to room, Pt left oob in wc w/alarm belt set and needs in reach  Therapy Documentation Precautions:  Precautions Precautions: Fall Precaution Comments: R BKA prosthesis not able to don 2/2 edema. Restrictions Weight Bearing Restrictions: No     Therapy/Group: Individual Therapy Rada Hay, PT   Shearon Balo 10/07/2020, 12:12 PM

## 2020-10-07 NOTE — Patient Care Conference (Signed)
Inpatient RehabilitationTeam Conference and Plan of Care Update Date: 10/07/2020   Time: 1:34 PM    Patient Name: [name]      Medical Record Number: [id number]  Date of Birth: 1965/05/19 Sex: Female         Room/Bed: 4W24C/4W24C-01 Payor Info: Payor: [employer] MEDICARE / Plan: BCBS MEDICARE / Product Type: *No Product type* /    Admit Date/Time:  10/02/2020  2:35 PM  Primary Diagnosis:  Debility  Hospital Problems: Principal Problem:   Debility    Expected Discharge Date: Expected Discharge Date: 10/25/20  Team Members Present: Physician leading conference: Dr. Sula Soda Social Worker Present: Dossie Der, LCSW Nurse Present: Kennyth Arnold, RN PT Present: Rada Hay, PT OT Present: Other (comment) [facility] [place] Poindexter, OT)     Current Status/Progress Goal Weekly Team Focus  Bowel/Bladder   Pt continent of urine. Voiding with PVRs less than 200. Incontinent of stool.  Pt to be continent of stool  Assess B/B every shift and PRN   Swallow/Nutrition/ Hydration             ADL's   UB bathing/dressing supervision seated EOB, LB bathing/dressing Max assist at bed level, toileting total assist with bed pan at bed level (pt can sometimes feel when she has to void/BM but doesn't always know when she has gone) and wears briefs, functional transfer- slide board transfer min-mod A and sit > stand with Mod x 2  Mod assist overall, min assist bathing  ADL retraining, activity tolerance, functional transfers, BUE strength, toileting management   Mobility   bed mobility CGA/supervision, slideboard transfers with min A but max multimodal cues 2/2 poor sequencing, WC mobility 128ft supervision, sit<>stand +2 assist  min A overall, Mod I WC mobility, mod A sit<>stand  functional mobility/transfers, generalized strengthening, dynamic standing balance/coordination, amputee education, endurance, and D/C planning.   Communication             Safety/Cognition/  Behavioral Observations            Pain   Pt with intermittent abdominal pain. Using oxycodone PRN  Pain scale <3/10  Assess pain every shift and PRN   Skin   Healing abdoinal incision. Old JP site wound and old chest tube site wound. MASD to buttocks  No new breakdown  Assess skin every shift and PRN     Discharge Planning:  Home with minor children who are in school, will need to see if her Mom will come from [place] to assist her at discharge   Team Discussion: Complains of left side back pain, K-pad ordered. Having urinary retention, continue to push fluids. Cr 1.97. Nursing having to I&O cath for inability to void. Incontinent of bowel, LBM 10/06/20. Reports 7/10 pain. Has Tylenol and Oxy IR available. Trazodone available for sleep. Wound care on going and healing. May need probiotic. Incontinence is a barrier. Supervision for bed mobility, supervision with slide board. Can't come to standing position. Max assist for lower body, toileting is total assist. Has mod assist goals. Relies heavily on son for transfers.  Patient on target to meet rehab goals: yes  *See Care Plan and progress notes for long and short-term goals.   Revisions to Treatment Plan:  MD added K-pad, decreased Lasix, encouraged hydration.  Teaching Needs: Family education, medication management, pain management, dietary management, hydration management, skin/wound care, transfer training, slide board training, balance training, endurance training, safety awareness.  Current Barriers to Discharge: Decreased caregiver support, Medical stability, Home  enviroment access/layout, Incontinence, Neurogenic bowel and bladder, Wound care, Lack of/limited family support, Insurance for SNF coverage, Weight, Weight bearing restrictions, Medication compliance, and Nutritional means  Possible Resolutions to Barriers: Continue current pain medications, and K-pad for pain. Will continue to encourage fluids and better nutrition, educate on  I&O caths if needed with family and patient, provide emotional support.     Medical Summary Current Status: obesity BMI 38.19, residual limb and phantom limb pain, left sided lower back pain, insomnia, urinary retention, AKI on CKD  Barriers to Discharge: Medical stability  Barriers to Discharge Comments: obesity BMI 38.19, residual limb and phantom limb pain, left sided lower back pain, insomnia, urinary retention, AKI on CKD Possible Resolutions to [employer] Focus: provide dietary education, continue pain regimen/kpad added, check UA/UC today, decrease Lasix dose, encouraged hydration   Continued Need for Acute Rehabilitation Level of Care: The patient requires daily medical management by a physician with specialized training in physical medicine and rehabilitation for the following reasons: Direction of a multidisciplinary physical rehabilitation program to maximize functional independence : Yes Medical management of patient stability for increased activity during participation in an intensive rehabilitation regime.: Yes Analysis of laboratory values and/or radiology reports with any subsequent need for medication adjustment and/or medical intervention. : Yes   I attest that I was present, lead the team conference, and concur with the assessment and plan of the team.   Tennis Must 10/07/2020, 2:39 PM

## 2020-10-07 NOTE — Progress Notes (Signed)
Physical Therapy Session Note  Patient Details  Name: [name] MRN: [id number] Date of Birth: May 18, 1965  Today's Date: 10/07/2020 PT Individual Time: 0915-1010 PT Individual Time Calculation (min): 55 min   Short Term Goals: Week 1:  PT Short Term Goal 1 (Week 1): Pt will transfer sup <> sit w/ mod I. PT Short Term Goal 2 (Week 1): Pt will transfer bed <> w/c w/ max A using slide board. PT Short Term Goal 3 (Week 1): Pt will transfer sit to stand w/ max A  Skilled Therapeutic Interventions/Progress Updates:  Patient supine in bed on entrance to room. Patient alert and agreeable to PT session. Requests check of brief and pants as she is not sure if she has "had an accident" and does not want to soil her last pair of clean pants.   Patient with no pain complaint throughout session.  Therapeutic Activity: Bed Mobility: Patient performed bed rolling from side to side requiring minA and cueing to complete turn for effective pericare. MaxA for pericare and brief change. Pt assists with re-donning pants in supine with modA. Supine <> sit completed with supervision. VC/ tc required for effort/ technique.  Transfers: Pt able to demonstrate lateral scoot along EOB to R starting with Min A and completing with supervision. Good forward lean and lift of bottom for effective scoot. Slide board transfer initiated with request to pt to relate to PT steps involved. Requires general cues to initiate recall of each step. Pt able to perform bed --> w/c slide with CGA. Same for return to bed with good balance maintained on arrival to bed surface.   Wheelchair Mobility:  Patient propelled wheelchair 75-125 ft/ bout for 4 bouts with supervision. Pt on 2L O2 and SpO2 checked throughout. Pt states feeling fine, but but with increased resp rate d/t increased exertion. First SpO2 checked at reading of 93%. O2 doffed for remainder of efforts. SpO2 drops to 85%  following each bout. Pt guided in PLB technique  and within 20 seconds is able to reach 90% or higher. No vc required for technique.   Patient supine  in bed at end of session with brakes locked, bed alarm set, and all needs within reach. NT present for bladder scan.      Therapy Documentation Precautions:  Precautions Precautions: Fall Precaution Comments: R BKA prosthesis not able to don 2/2 edema. Restrictions Weight Bearing Restrictions: No General:   Vital Signs: Therapy Vitals Pulse Rate: 73 BP: 137/73 Pain:  No pain complaint throughout session.   Therapy/Group: Individual Therapy  Loel Dubonnet PT, DPT 10/07/2020, 9:16 AM

## 2020-10-07 NOTE — Progress Notes (Signed)
PROGRESS NOTE   Subjective/Complaints: C/o urinary retention- able to void sometimes, but not consistently.  Sleeping on-off Continues to have some left sided low back pain  ROS: denies shortness of breath, +left sided low back pain   Objective:   No results found. Recent Labs    10/06/20 0458  WBC 10.5  HGB 8.9*  HCT 30.5*  PLT 635*   Recent Labs    10/06/20 0458  NA 132*  K 4.4  CL 99  CO2 20*  GLUCOSE 128*  BUN 34*  CREATININE 1.97*  CALCIUM 8.7*    Intake/Output Summary (Last 24 hours) at 10/07/2020 0832 Last data filed at 10/06/2020 1637 Gross per 24 hour  Intake 480 ml  Output 800 ml  Net -320 ml        Physical Exam: Vital Signs Blood pressure 137/73, pulse 73, temperature 98 F (36.7 C), resp. rate 14, height 5\' 7"  (1.702 m), weight 110.6 kg, SpO2 90 %. Gen: no distress, normal appearing HEENT: oral mucosa pink and moist, NCAT Cardio: Reg rate Chest: normal effort, normal rate of breathing Abd: soft, non-distended Ext: no edema Psych: pleasant, normal affect Skin: intact Genitourinary:    Comments: Purewick in place Musculoskeletal:     Cervical back: Normal range of motion and neck supple.     Comments: Ues- 4+/5 except 4/5 in FA B/L Les; RLE- 4+/5 in HF/KE/KF- old R BKA LLE- HF 4-/5, distally 4+/5   Assessment/Plan: 1. Functional deficits which require 3+ hours per day of interdisciplinary therapy in a comprehensive inpatient rehab setting. Physiatrist is providing close team supervision and 24 hour management of active medical problems listed below. Physiatrist and rehab team continue to assess barriers to discharge/monitor patient progress toward functional and medical goals  Care Tool:  Bathing    Body parts bathed by patient: Face, Right arm, Left arm, Chest, Abdomen   Body parts bathed by helper: Front perineal area, Buttocks Body parts n/a: Right lower leg   Bathing  assist Assist Level: Maximal Assistance - Patient 24 - 49%     Upper Body Dressing/Undressing Upper body dressing   What is the patient wearing?: Pull over shirt    Upper body assist Assist Level: Supervision/Verbal cueing    Lower Body Dressing/Undressing Lower body dressing      What is the patient wearing?: Pants     Lower body assist Assist for lower body dressing: Moderate Assistance - Patient 50 - 74% Assistive Device Comment: Rolling R<>L supine at bed level   Toileting Toileting    Toileting assist Assist for toileting: Total Assistance - Patient < 25%     Transfers Chair/bed transfer  Transfers assist     Chair/bed transfer assist level: Minimal Assistance - Patient > 75% (slideboard)     Locomotion Ambulation   Ambulation assist   Ambulation activity did not occur: Safety/medical concerns          Walk 10 feet activity   Assist  Walk 10 feet activity did not occur: Safety/medical concerns        Walk 50 feet activity   Assist Walk 50 feet with 2 turns activity did not occur: Safety/medical concerns  Walk 150 feet activity   Assist Walk 150 feet activity did not occur: Safety/medical concerns         Walk 10 feet on uneven surface  activity   Assist Walk 10 feet on uneven surfaces activity did not occur: Safety/medical concerns         Wheelchair     Assist Is the patient using a wheelchair?: Yes Type of Wheelchair: Manual    Wheelchair assist level: Supervision/Verbal cueing Max wheelchair distance: 150    Wheelchair 50 feet with 2 turns activity    Assist        Assist Level: Supervision/Verbal cueing   Wheelchair 150 feet activity     Assist      Assist Level: Supervision/Verbal cueing   Blood pressure 137/73, pulse 73, temperature 98 F (36.7 C), resp. rate 14, height 5\' 7"  (1.702 m), weight 110.6 kg, SpO2 90 %.  Medical Problem List and Plan: 1.  Debility secondary to  ITP/s/p splenectomy and old R BKA- hasn't worn her new prosthesis due to fitting issues             -patient may  shower if cover dressing             -ELOS/Goals: 10-14 days- mod I to supervision  Continue CIR PT OT  -Interdisciplinary Team Conference today   2.  Antithrombotics: -DVT/anticoagulation:  Pharmaceutical: Lovenox             -antiplatelet therapy: N/A 3. Pain: Decrease Oxycodone to 5mg  q6H prn.  4. Mood: LCSW to follow for evaluation and support.              -antipsychotic agents: N/A 5. Neuropsych: This patient is capable of making decisions on her own behalf. 6. Skin/Wound Care: Monitor wound for healing.   7. Fluids/Electrolytes/Nutrition: Monitor I/O.  Metabolic package shows mild elevation of BUN and creatinine over baseline.,  Albumin remains low but slightly higher than prior now up to 2.5 8. ITP/pancytopenia/anemia:Improving with Hgb-8.4, WBC 13.3 and platelets -892. Will hold off on aspirin.  9. MRSE bacteremia/HAP: IV antibiotics transitioned to Augmentin thru 10/20/20 10. Left pleural effusion s/p thoracotomy 9/19: To follow up with pulmonary after discharge.                   --encourage pulmonary hygiene with flutter valve.  11. Acute on chronic CHF: Lasix decreased to 40 mg bid. Amlodipine d/c.              --monitor I/O. Check weights daily. Monitor for signs of overload.             --continue Toprol XL, Lasix,  12. HTN: Monitor BP TID--continue Toprol, Lasix, Imdur. BP labile: continue to monitor.  Vitals:   10/07/20 0406 10/07/20 0800  BP: 134/60 137/73  Pulse: 71 73  Resp: 14   Temp: 98 F (36.7 C)   SpO2: 90%    13. T2DM: Hgb A1C- 5.5 and well controlled on metformin BID PTA             --on hold due to AKI. Will continue to monitor BS ac/hs and use SSI for now  CBGs 128-157: provide dietary education.  14. Acute on chronic CKD III: BUN/SCr had normalized to 18/1.0 , now Cr up to 1.97. Decrease Lasix to 20mg  BID 15. Thyroid nodules: Follow up with  [facility] after discharge.  16.  Cecal wall thickening: Will need to contact GI when nearing discharge to determine timing of colonoscopy.  17. Severe obesity BMI 36.95: provide dietary education,  18.  Thrombocytosis will monitor platelets, expect this to remain elevated status post splenectomy, trending down to 635 on 10/3. Recheck today.  19. Urinary retention: check UA/UC.  20. Left sided low back pain: add kpad. 21. Disposition: HFU scheduled  LOS: 5 days A FACE TO FACE EVALUATION WAS PERFORMED  Drema Pry Bennie Chirico 10/07/2020, 8:32 AM

## 2020-10-07 NOTE — Progress Notes (Signed)
Patient ID: [name], female   DOB: 02/13/1965, 55 y.o.   MRN: [id number] Met with pt to discus team conference min assist level goals and discharge 10/22. Discussed the plan for when her children are at school and she is home alone. She feels she can manage like she did before. Asked if Mom can come down from [place] she voiced she does not want this, plus her Mom works and is busy. Pt feels her insurance will pay for her to stay here longer, discussed that is not how it works. Pt does not have an alternate plan and wants to wait and see how she does. Will continue to work on plans with pt she is somewhat stubborn and does what she wants she did this when here before, this worker had her then.

## 2020-10-08 DIAGNOSIS — R5381 Other malaise: Secondary | ICD-10-CM | POA: Diagnosis not present

## 2020-10-08 LAB — URINE CULTURE: Culture: >=100000 — AB

## 2020-10-08 LAB — GLUCOSE, CAPILLARY
Glucose-Capillary: 113 mg/dL — ABNORMAL HIGH (ref 70–99)
Glucose-Capillary: 152 mg/dL — ABNORMAL HIGH (ref 70–99)
Glucose-Capillary: 160 mg/dL — ABNORMAL HIGH (ref 70–99)
Glucose-Capillary: 147 mg/dL — ABNORMAL HIGH (ref 70–99)

## 2020-10-08 MED ORDER — FLUCONAZOLE 100 MG PO TABS
100.0000 mg | ORAL_TABLET | Freq: Every day | ORAL | Status: AC
  Administered 2020-10-08 – 2020-10-14 (×7): 100 mg via ORAL
  Filled 2020-10-08 (×7): qty 1

## 2020-10-08 NOTE — Progress Notes (Signed)
Physical Therapy Session Note  Patient Details  Name: [name] MRN: [id number] Date of Birth: 07-27-1965  Today's Date: 10/08/2020 PT Individual Time: 1000-1100 PT Individual Time Calculation (min): 60 min   Short Term Goals: Week 1:  PT Short Term Goal 1 (Week 1): Pt will transfer sup <> sit w/ mod I. PT Short Term Goal 2 (Week 1): Pt will transfer bed <> w/c w/ max A using slide board. PT Short Term Goal 3 (Week 1): Pt will transfer sit to stand w/ max A  Skilled Therapeutic Interventions/Progress Updates: Pt presents semi-reclined in bed and agreeable to therapy.  Pt transfers sup to sit w/ supervision and scoots to EOB.  Pt required min A for complete positioning of SB to transfer to Left.  Pt required CGA bed > w/c and occasional verbal cues for forward lean.  Pt wheeled to main gym x 150' w/ supervision.  Pt performed SB transfer w/c > mat table w/ min A and mod A for SB placement.  Pt required min A for transfer w/ increased verbal cues for forward lean for hip clearance from seat.  Pt performed seated bicep curl w/ 3# dowel rod 3 x 15, "w/c pushups" using supports and able to clear buttocks from mat.  Visual cueing for leaning over PT shoulder to improve forward lean.  Pt transferred mat table > w/c w/ min A.  Pt wheeled back to room and remained sitting in w/c w/ chair alarm on and all needs in reach.     Therapy Documentation Precautions:  Precautions Precautions: Fall Precaution Comments: R BKA prosthesis not able to don 2/2 edema. Restrictions Weight Bearing Restrictions: No General:   Vital Signs:  Pain: 0/10 w/ pain meds.     Therapy/Group: Individual Therapy  Lucio Edward 10/08/2020, 12:48 PM

## 2020-10-08 NOTE — Progress Notes (Signed)
PROGRESS NOTE   Subjective/Complaints: Voided this morning. Discussed her weakly positive UA and starting antibiotics, f/u UC She has not received kpad- ordered for left sided lower back pain  ROS: denies shortness of breath, +left sided low back pain, urinary retention improved   Objective:   No results found. Recent Labs    10/06/20 0458 10/07/20 0858  WBC 10.5 11.8*  HGB 8.9* 8.4*  HCT 30.5* 29.5*  PLT 635* 712*   Recent Labs    10/06/20 0458 10/07/20 0858  NA 132* 131*  K 4.4 4.6  CL 99 100  CO2 20* 22  GLUCOSE 128* 103*  BUN 34* 35*  CREATININE 1.97* 1.61*  CALCIUM 8.7* 8.7*    Intake/Output Summary (Last 24 hours) at 10/08/2020 0959 Last data filed at 10/07/2020 1838 Gross per 24 hour  Intake 420 ml  Output 600 ml  Net -180 ml        Physical Exam: Vital Signs Blood pressure (!) 158/76, pulse 73, temperature 98.8 F (37.1 C), temperature source Oral, resp. rate 18, height 5\' 7"  (1.702 m), weight 108.4 kg, SpO2 97 %. Gen: no distress, normal appearing HEENT: oral mucosa pink and moist, NCAT Cardio: Reg rate Chest: normal effort, normal rate of breathing Abd: soft, non-distended Ext: no edema Psych: pleasant, normal affect Skin: intact Genitourinary:    Comments: Purewick in place Musculoskeletal:     Cervical back: Normal range of motion and neck supple.     Comments: Ues- 4+/5 except 4/5 in FA B/L Les; RLE- 4+/5 in HF/KE/KF- old R BKA LLE- HF 4-/5, distally 4+/5   Assessment/Plan: 1. Functional deficits which require 3+ hours per day of interdisciplinary therapy in a comprehensive inpatient rehab setting. Physiatrist is providing close team supervision and 24 hour management of active medical problems listed below. Physiatrist and rehab team continue to assess barriers to discharge/monitor patient progress toward functional and medical goals  Care Tool:  Bathing    Body parts  bathed by patient: Face, Right arm, Left arm, Chest, Abdomen   Body parts bathed by helper: Front perineal area, Buttocks Body parts n/a: Right lower leg   Bathing assist Assist Level: Maximal Assistance - Patient 24 - 49%     Upper Body Dressing/Undressing Upper body dressing   What is the patient wearing?: Pull over shirt    Upper body assist Assist Level: Supervision/Verbal cueing    Lower Body Dressing/Undressing Lower body dressing      What is the patient wearing?: Pants     Lower body assist Assist for lower body dressing: Moderate Assistance - Patient 50 - 74% Assistive Device Comment: Rolling R<>L supine at bed level   Toileting Toileting    Toileting assist Assist for toileting: Total Assistance - Patient < 25%     Transfers Chair/bed transfer  Transfers assist     Chair/bed transfer assist level: Contact Guard/Touching assist (slideboard transfer)     Locomotion Ambulation   Ambulation assist   Ambulation activity did not occur: Safety/medical concerns          Walk 10 feet activity   Assist  Walk 10 feet activity did not occur: Safety/medical concerns  Walk 50 feet activity   Assist Walk 50 feet with 2 turns activity did not occur: Safety/medical concerns         Walk 150 feet activity   Assist Walk 150 feet activity did not occur: Safety/medical concerns         Walk 10 feet on uneven surface  activity   Assist Walk 10 feet on uneven surfaces activity did not occur: Safety/medical concerns         Wheelchair     Assist Is the patient using a wheelchair?: Yes Type of Wheelchair: Manual    Wheelchair assist level: Supervision/Verbal cueing Max wheelchair distance: 150    Wheelchair 50 feet with 2 turns activity    Assist        Assist Level: Supervision/Verbal cueing   Wheelchair 150 feet activity     Assist      Assist Level: Supervision/Verbal cueing   Blood pressure (!)  158/76, pulse 73, temperature 98.8 F (37.1 C), temperature source Oral, resp. rate 18, height 5\' 7"  (1.702 m), weight 108.4 kg, SpO2 97 %.  Medical Problem List and Plan: 1.  Debility secondary to ITP/s/p splenectomy and old R BKA- hasn't worn her new prosthesis due to fitting issues             -patient may  shower if cover dressing             -ELOS/Goals: 10-14 days- mod I to supervision  Continue CIR PT OT 2.  Antithrombotics: -DVT/anticoagulation:  Pharmaceutical: Lovenox             -antiplatelet therapy: N/A 3. Pain: Decrease Oxycodone to 5mg  q6H prn.  4. Mood: LCSW to follow for evaluation and support.              -antipsychotic agents: N/A 5. Neuropsych: This patient is capable of making decisions on her own behalf. 6. Skin/Wound Care: Monitor wound for healing.   7. Fluids/Electrolytes/Nutrition: Monitor I/O.  Metabolic package shows mild elevation of BUN and creatinine over baseline.,  Albumin remains low but slightly higher than prior now up to 2.5 8. ITP/pancytopenia/anemia:Improving with Hgb-8.4, WBC 13.3 and platelets -892. Will hold off on aspirin.  9. MRSE bacteremia/HAP: IV antibiotics transitioned to Augmentin thru 10/20/20 10. Left pleural effusion s/p thoracotomy 9/19: To follow up with pulmonary after discharge.                   --encourage pulmonary hygiene with flutter valve.  11. Acute on chronic CHF: Lasix decreased to 40 mg bid. Amlodipine d/c.              --monitor I/O. Check weights daily. Monitor for signs of overload.             --continue Toprol XL, Lasix,  12. HTN: Monitor BP TID--continue Toprol, Lasix, Imdur. BP labile: continue to monitor.  Vitals:   10/07/20 1946 10/08/20 0604  BP: 139/74 (!) 158/76  Pulse: 70 73  Resp: 18 18  Temp: 98.5 F (36.9 C) 98.8 F (37.1 C)  SpO2: 95% 97%   13. T2DM: Hgb A1C- 5.5 and well controlled on metformin BID PTA             --on hold due to AKI. Will continue to monitor BS ac/hs and use SSI for now  CBGs  128-157: provide dietary education.  14. Acute on chronic CKD III: BUN/SCr had normalized to 18/1.0 , went up to 1.97. Decrease Lasix to 20mg  BID and  Cr improved to 1.61. Will consult pharm to assist with diflucan dosing given kidney function.  15. Thyroid nodules: Follow up with [facility] after discharge.  16.  Cecal wall thickening: Will need to contact GI when nearing discharge to determine timing of colonoscopy.  17. Severe obesity BMI 36.95: provide dietary education,  18.  Thrombocytosis will monitor platelets, expect this to remain elevated status post splenectomy, trending down to 635 on 10/3. 712 on 10/4  19. Urinary retention: UC+ for >100,000 colonies yeast- switch macrobid to diflucan 20. Left sided low back pain: continue kpad. 21. Disposition: HFU scheduled  LOS: 6 days A FACE TO FACE EVALUATION WAS PERFORMED  Elzina Devera P Vesta [place] 10/08/2020, 9:59 AM

## 2020-10-08 NOTE — Progress Notes (Signed)
Occupational Therapy Session Note  Patient Details  Name: [name] MRN: [id number] Date of Birth: 07-14-1965  Today's Date: 10/08/2020 OT Individual Time: 1300-1359 OT Individual Time Calculation (min): 59 min    Short Term Goals: Week 1:  OT Short Term Goal 1 (Week 1): Pt will bathe lower body with mod A OT Short Term Goal 2 (Week 1): Pt will dress lower body with max A OT Short Term Goal 3 (Week 1): Pt will complete lateral weight shifting with mod A in preparation for LB dressing OT Short Term Goal 4 (Week 1): Pt will complete toileting tasks with Max A OT Short Term Goal 5 (Week 1): Pt will complete toileting transfer with Max A  Skilled Therapeutic Interventions/Progress Updates:  Skilled OT intervention completed with focus on ADL retraining and functional transfers including to [facility] and regular toilet. Pt received upright in bed without [place] donned, and agreeable to therapy session. Checked O2, with stats at 70, then donned [place] with 1 L O2 to 90. Pt educated on importance of wearing O2, even with trying to wean during exercise. Pt donned pants up to buttocks while in long sitting in bed, with set up A, then required min A to donn waistline over hips while supine in bed. Pt transitioned to EOB with supervision, then donned tennis shoe while seated EOB with mod A. Pt completed squat pivot transfer seated EOB > w/c with mod x2, for stabilization of w/c as pt's w/c scoots during heavy transfers. Pt required heavy encouragement and mod verbal cues to sequence positioning throughout transfer, however pt demonstrated improvement with the transfer as well as her willingness to try. Pt transferred w/c <> regular toilet in pt's room, while using grab bars to squat pivot with heavy mod x2. Pt demonstrates the need for increased time, and repetitive cues to process/initiate higher level transfers. Pt completed 10 x2 partial sit > stands, first with chair push ups with focus to clear bottom, then  seated at sink to simulate pt's PLOF at home in her bathroom during ADL management. Requiring verbal and tactile cues to activate intact leg and encourage weight shift. Therapist utilized the [employer], to promote 3 full sit <> stands, with tactile input provided to pt's buttocks for lifting and intact leg to promote weight bearing and activation through the standing process. Pt tolerated the [place] lift well, with verbal encouragement provided per her progress and would benefit from continued use to increase pt's confidence and promote hip extension needed for full standing vs forward flexion of the trunk. O2 rechecked at end of session with stat of 96, [place] left on. Pt left seated in w/c at bedside, with safety belt donned, and all needs in reach at end of session.  Therapy Documentation Precautions:  Precautions Precautions: Fall Precaution Comments: R BKA prosthesis not able to don 2/2 edema. Restrictions Weight Bearing Restrictions: No  Pain: No complaints of pain during today's session.   Therapy/Group: Individual Therapy  Krisanne Lich E Willim Turnage 10/08/2020, 2:49 PM

## 2020-10-08 NOTE — Progress Notes (Signed)
Pharmacy Antibiotic Note  [name] is a 55 y.o. female admitted on 10/02/2020 with UTI.  Pharmacy has been consulted for fluconazole dosing.  Fluconazole was ordered for yeast in the urine. Pt c/o of urinary retention per MD. MD ok for 7d  Scr 1.61>>CrCl 51 ml/hr  Plan: Fluconazole 100mg  PO qday x7 Rx signs off  Height: 5\' 7"  (170.2 cm) Weight: 108.4 kg (238 lb 15.7 oz) IBW/kg (Calculated) : 61.6  Temp (24hrs), Avg:98.7 F (37.1 C), Min:98.5 F (36.9 C), Max:98.8 F (37.1 C)  Recent Labs  Lab 10/02/20 0457 10/03/20 0606 10/06/20 0458 10/07/20 0858  WBC 13.3* 12.4* 10.5 11.8*  CREATININE  --  1.26* 1.97* 1.61*    Estimated Creatinine Clearance: 51.2 mL/min (A) (by C-G formula based on SCr of 1.61 mg/dL (H)).    No Known Allergies  Antimicrobials this admission: 9/18 Zosyn>>> 9/20 9/20 cefepime> 9/26 9/20 flagyl >9/21 9/20 Vanc> 9/26 Augmentin 9/26>>(10/17) Fluconazole 10/5>> 10/11  Dose adjustments this admission:   Microbiology results:  9/18 blood cx: 2/2 GPC (MRSE)  9/19 pleural fluid: NG 9/21 bcxX2: ng (FINAL) 10/4 urine>>100k yeast  10/19, PharmD, BCIDP, AAHIVP, CPP Infectious Disease Pharmacist 10/08/2020 11:06 AM

## 2020-10-08 NOTE — Progress Notes (Signed)
Occupational Therapy Session Note  Patient Details  Name: MELA PERHAM MRN: [id number] Date of Birth: 12-21-1965  Today's Date: 10/08/2020 OT Individual Time: 4193-7902 OT Individual Time Calculation (min): 54 min    Short Term Goals: Week 1:  OT Short Term Goal 1 (Week 1): Pt will bathe lower body with mod A OT Short Term Goal 2 (Week 1): Pt will dress lower body with max A OT Short Term Goal 3 (Week 1): Pt will complete lateral weight shifting with mod A in preparation for LB dressing OT Short Term Goal 4 (Week 1): Pt will complete toileting tasks with Max A OT Short Term Goal 5 (Week 1): Pt will complete toileting transfer with Max A  Skilled Therapeutic Interventions/Progress Updates:  Pt greeted supine in bed finishing up breakfast agreeable to OT intervention. Session focus on self care retraining and functional transfers.  Pt reports feeling need to void bladder. Pt able completed bed mobility with supervision, pt agreeable to attempt SB transfer from EOB>drop arm 3n1. Pillow case donned over SB to protect skin integrity during transfer. Pt completed SB transfer to drop arm to pts L side with CGA and MOD cues for hand placement and attention to task as pt noted to become distracted by environment such as dropping tissue etc. Pt reports "this seems like a lot of work." Pt also wanting to doff O2 during transfers, education provided on importance of keeping O2 on especially during functional activities where pt is exerting herself. Pt able to void b/b with RN aware. Pt able to complete lateral leans to complete anterior/posterior pericare from drop arm 3n1 however pt reports she wishes she could just stand and do it like she does at home however provided education on lateral leans being the safest method at this point d/t decreased ability to stand. Pt completed SB transfer back to bed with more of MIN- MOD A to pts R side. RN enter with pts meds, left pt with RN and alerted NT to assist  with dressing tasks.                    Therapy Documentation Precautions:  Precautions Precautions: Fall Precaution Comments: R BKA prosthesis not able to don 2/2 edema. Restrictions Weight Bearing Restrictions: No  Pain: pt reports no pain during session     Therapy/Group: Individual Therapy  Pollyann Glen [facility] 10/08/2020, 8:58 AM

## 2020-10-08 NOTE — Plan of Care (Signed)
  Problem: RH BLADDER ELIMINATION Goal: RH STG MANAGE BLADDER WITH ASSISTANCE Description: STG Manage Bladder With Supervision Assistance Outcome: Not Progressing; UTI ; bladder scan

## 2020-10-08 NOTE — Progress Notes (Signed)
Physical Therapy Session Note  Patient Details  Name: [name] MRN: [id number] Date of Birth: 1965/03/12  Today's Date: 10/08/2020 PT Individual Time: 1415-1441 PT Individual Time Calculation (min): 26 min   Short Term Goals: Week 1:  PT Short Term Goal 1 (Week 1): Pt will transfer sup <> sit w/ mod I. PT Short Term Goal 2 (Week 1): Pt will transfer bed <> w/c w/ max A using slide board. PT Short Term Goal 3 (Week 1): Pt will transfer sit to stand w/ max A  Skilled Therapeutic Interventions/Progress Updates:   Received pt sitting in WC, pt agreeable to PT treatment, and denied any pain during session. Session with emphasis on WC mobility, generalized strengthening, dynamic sitting balance/coordination, and improved activity tolerance. Pt on 1L O2 with O2 sat 91% at rest and remained >90% during session. RN present to administer medication and pt performed WC mobility 141ft using BUE and supervision to dayroom with emphasis on UE strength and endurance. Pt performed seated LLE strengthening on Kinetron at 20 cm/sec for 1 minute x 3 trials with UE support and therapist providing manual counter resistance with emphasis on quad/glute strengthening. Pt transported back to room in [facility] total A and encouraged pt to sit up so that she will sleep better at night and for respiratory benefits; pt agreed. Required cues to put O2 back on at end of session as pt unaware that she had taken it off and set it in her lap. Concluded session with pt sitting in WC, needs within reach, and seatbelt alarm on. Provided pt with fresh drink.   Therapy Documentation Precautions:  Precautions Precautions: Fall Precaution Comments: R BKA prosthesis not able to don 2/2 edema. Restrictions Weight Bearing Restrictions: No  Therapy/Group: Individual Therapy Kloster Majestic PT, DPT   10/08/2020, 7:47 AM

## 2020-10-09 DIAGNOSIS — R5381 Other malaise: Secondary | ICD-10-CM | POA: Diagnosis not present

## 2020-10-09 LAB — GLUCOSE, CAPILLARY
Glucose-Capillary: 114 mg/dL — ABNORMAL HIGH (ref 70–99)
Glucose-Capillary: 124 mg/dL — ABNORMAL HIGH (ref 70–99)
Glucose-Capillary: 131 mg/dL — ABNORMAL HIGH (ref 70–99)
Glucose-Capillary: 129 mg/dL — ABNORMAL HIGH (ref 70–99)

## 2020-10-09 MED ORDER — FUROSEMIDE 40 MG PO TABS
40.0000 mg | ORAL_TABLET | Freq: Two times a day (BID) | ORAL | Status: DC
  Administered 2020-10-09 – 2020-10-13 (×8): 40 mg via ORAL
  Filled 2020-10-09 (×8): qty 1

## 2020-10-09 NOTE — Progress Notes (Signed)
Physical Therapy Session Note  Patient Details  Name: CHASTELYN ATHENS MRN: [id number] Date of Birth: [birth year]  Today's Date: 10/09/2020 PT Individual Time: 8182-9937 PT Individual Time Calculation (min): 71 min   Short Term Goals: Week 1:  PT Short Term Goal 1 (Week 1): Pt will transfer sup <> sit w/ mod I. PT Short Term Goal 2 (Week 1): Pt will transfer bed <> w/c w/ max A using slide board. PT Short Term Goal 3 (Week 1): Pt will transfer sit to stand w/ max A  Skilled Therapeutic Interventions/Progress Updates:   Received pt semi-reclined in bed reporting having a "gurgling" stomach and urge to have BM and unsure if she had already gone in her brief. Pt rolled R with supervision and use of bedrail and therapist removed soiled brief and placed bedpan for pt to continue BM. Pt prefers to get in long sitting position on bedpan, resulting in soiled chuck pad. Pt required increased time on bedpan and with loose, green stool; notified NT. Removed bedpan, performed peri-care dependently, donned clean brief, and placed clean chuck pad. Pt agreeable to PT treatment, and denied any pain during session. Session with emphasis on toileting, dressing, functional mobility/transfers, generalized strengthening, NMR, blocked practice transfers, and improved activity tolerance. Pt doffed dirty gown and donned sports bra and pull over shirt with set up assist. Pt able to pull pants up to thighs but required max A from therapist to pull completley over hips via rolling L/R. O2 dropped to 88% on RA while dressing and reminded pt to put [place] back on after getting on shirt, pt stating "y'all are crazy about that". Pt transferred supine<>sitting EOB from flat bed with supervision and donned L shoe with set up assist. Pt transferred bed<>WC via slideboard to R with CGA. O2 sat 91% on 2L O2 via [place]. Pt transported to/from room in [facility] total A for time management purposes. Decreased to 1L and O2 sat remained above >90%  throughout remainder of session. Worked on blocked practice squat<>pivot/lateral scoot transfers x 6 reps. Pt required max cues for hand placement, anterior weight shifting, and head/hips relationship but ultimately unable to perform true squat<>pivot resulting in multiple, inefficient lateral scoots to/from WC. Pt with increased difficulty transferring to R>L and required mod A overall for transfer while stabilizing WC. Upon returning to [facility] for final time pt with increased fatigue and WC sliding away from pt, therefore required max A +2 to stabilize WC to get hips scooted back on mat for safety. Concluded session with pt sitting in WC, needs within reach, and seatbelt alarm on. Pt left on 1L O2 via [place] with O2 sat 95%.   Therapy Documentation Precautions:  Precautions Precautions: Fall Precaution Comments: R BKA prosthesis not able to don 2/2 edema. Restrictions Weight Bearing Restrictions: No  Therapy/Group: Individual Therapy Buell Majestic PT, DPT   10/09/2020, 7:23 AM

## 2020-10-09 NOTE — Progress Notes (Addendum)
Physical Therapy Session Note  Patient Details  Name: [name] MRN: [id number] Date of Birth: [birth year]  Today's Date: 10/09/2020 PT Individual Time: 1445-1530 PT Individual Time Calculation (min): 45 min   Short Term Goals: Week 1:  PT Short Term Goal 1 (Week 1): Pt will transfer sup <> sit w/ mod I. PT Short Term Goal 2 (Week 1): Pt will transfer bed <> w/c w/ max A using slide board. PT Short Term Goal 3 (Week 1): Pt will transfer sit to stand w/ max A  Skilled Therapeutic Interventions/Progress Updates:     Pt misses 15 minutes of skilled PT due to RN care as pt on bedside commode when PT enters initially. Upon return pt still on [place] with multiple RN staff in room, unable to transfer pt. PT educated Engineer, manufacturing on transferring pt to sound leg side. WC positioned to L of pt and pt performs slideboard transfer to [facility] with modA +2 and cues for body mechanics and sequencing. WC transport to gym for time management. Pt performs transfer training in parallel bars. Initially PT discusses head hips relationship and importance of anterior weight transition, and performs demonstration for pt. Pt then practices flexing forward at hips with shoulders coming toward PT providing exterior cue for feedback. Pt attempts sit to stand several times and is unable to complete, despite PT providing heavy maxA, due to inadequate anterior weight shift. Pt re-educated on importance of body mechanics. Pt then performs sit to stand and achieves full standing with maxA at hips to facilitate extension as well as anterior weight shift. Pt remains standing ~1 minute with cues for posture to improve balance and light blocking of L knee for stability. PT performs x4 additional reps with improving efficiency, eventually requiring modA. In standing, pt progresses to alternating upper extremity support for balance challenge. Extended seated rest breaks between each bout. Pt self propels WC x75' with bilateral upper extremities  and L lower extremity, for endurance training. Pt left seated in WC with alarm intact and all needs within reach.  Therapy Documentation Precautions:  Precautions Precautions: Fall Precaution Comments: R BKA prosthesis not able to don 2/2 edema. Restrictions Weight Bearing Restrictions: No   Therapy/Group: Individual Therapy  Beau Fanny, PT, DPT 10/09/2020, 3:49 PM

## 2020-10-09 NOTE — Progress Notes (Signed)
PROGRESS NOTE   Subjective/Complaints: Voiding much better Some residual limb pain and left sided low back pain Gurgling stomach Therapy going well  ROS: denies shortness of breath, +left sided low back pain, urinary retention improved, +mild phantom limb pain   Objective:   No results found. Recent Labs    10/07/20 0858  WBC 11.8*  HGB 8.4*  HCT 29.5*  PLT 712*   Recent Labs    10/07/20 0858  NA 131*  K 4.6  CL 100  CO2 22  GLUCOSE 103*  BUN 35*  CREATININE 1.61*  CALCIUM 8.7*    Intake/Output Summary (Last 24 hours) at 10/09/2020 1321 Last data filed at 10/08/2020 1838 Gross per 24 hour  Intake 180 ml  Output --  Net 180 ml        Physical Exam: Vital Signs Blood pressure (!) 152/72, pulse 70, temperature 98.6 F (37 C), temperature source Oral, resp. rate 18, height 5\' 7"  (1.702 m), weight 110.2 kg, SpO2 95 %. Gen: no distress, normal appearing HEENT: oral mucosa pink and moist, NCAT Cardio: Reg rate Chest: normal effort, normal rate of breathing Abd: soft, non-distended Ext: no edema Psych: pleasant, normal affect Skin: intact Genitourinary:    Comments: Purewick in place Musculoskeletal:     Cervical back: Normal range of motion and neck supple.     Comments: Ues- 4+/5 except 4/5 in FA B/L Les; RLE- 4+/5 in HF/KE/KF- old R BKA LLE- HF 4-/5, distally 4+/5   Assessment/Plan: 1. Functional deficits which require 3+ hours per day of interdisciplinary therapy in a comprehensive inpatient rehab setting. Physiatrist is providing close team supervision and 24 hour management of active medical problems listed below. Physiatrist and rehab team continue to assess barriers to discharge/monitor patient progress toward functional and medical goals  Care Tool:  Bathing    Body parts bathed by patient: Face, Right arm, Left arm, Chest, Abdomen   Body parts bathed by helper: Front perineal area,  Buttocks Body parts n/a: Right lower leg   Bathing assist Assist Level: Maximal Assistance - Patient 24 - 49%     Upper Body Dressing/Undressing Upper body dressing   What is the patient wearing?: Pull over shirt    Upper body assist Assist Level: Supervision/Verbal cueing    Lower Body Dressing/Undressing Lower body dressing      What is the patient wearing?: Pants     Lower body assist Assist for lower body dressing: Moderate Assistance - Patient 50 - 74% Assistive Device Comment: Rolling R<>L supine at bed level   Toileting Toileting    Toileting assist Assist for toileting: Total Assistance - Patient < 25%     Transfers Chair/bed transfer  Transfers assist     Chair/bed transfer assist level: Contact Guard/Touching assist (slideboard)     Locomotion Ambulation   Ambulation assist   Ambulation activity did not occur: Safety/medical concerns          Walk 10 feet activity   Assist  Walk 10 feet activity did not occur: Safety/medical concerns        Walk 50 feet activity   Assist Walk 50 feet with 2 turns activity did not occur:  Safety/medical concerns         Walk 150 feet activity   Assist Walk 150 feet activity did not occur: Safety/medical concerns         Walk 10 feet on uneven surface  activity   Assist Walk 10 feet on uneven surfaces activity did not occur: Safety/medical concerns         Wheelchair     Assist Is the patient using a wheelchair?: Yes Type of Wheelchair: Manual    Wheelchair assist level: Supervision/Verbal cueing Max wheelchair distance: 150    Wheelchair 50 feet with 2 turns activity    Assist        Assist Level: Supervision/Verbal cueing   Wheelchair 150 feet activity     Assist      Assist Level: Supervision/Verbal cueing   Blood pressure (!) 152/72, pulse 70, temperature 98.6 F (37 C), temperature source Oral, resp. rate 18, height 5\' 7"  (1.702 m), weight 110.2 kg,  SpO2 95 %.  Medical Problem List and Plan: 1.  Debility secondary to ITP/s/p splenectomy and old R BKA- hasn't worn her new prosthesis due to fitting issues             -patient may  shower if cover dressing             -ELOS/Goals: 10-14 days- mod I to supervision  Continue CIR PT OT 2.  Antithrombotics: -DVT/anticoagulation:  Pharmaceutical: Lovenox             -antiplatelet therapy: N/A 3. Intermittent RLE pain: Decrease Oxycodone to 5mg  q6H prn. Provided with a pain relief journal 4. Mood: LCSW to follow for evaluation and support.              -antipsychotic agents: N/A 5. Neuropsych: This patient is capable of making decisions on her own behalf. 6. Skin/Wound Care: Monitor wound for healing.   7. Fluids/Electrolytes/Nutrition: Monitor I/O.  Metabolic package shows mild elevation of BUN and creatinine over baseline.,  Albumin remains low but slightly higher than prior now up to 2.5 8. ITP/pancytopenia/anemia:Improving with Hgb-8.4, WBC 13.3 and platelets -892. Will hold off on aspirin.  9. MRSE bacteremia/HAP: IV antibiotics transitioned to Augmentin thru 10/20/20 10. Left pleural effusion s/p thoracotomy 9/19: To follow up with pulmonary after discharge.                   --encourage pulmonary hygiene with flutter valve.  11. Acute on chronic CHF: Lasix decreased to 40 mg bid. Amlodipine d/c.              --monitor I/O. Check weights daily. Monitor for signs of overload.             --continue Toprol XL, Lasix,  12. HTN: Monitor BP TID--continue Toprol, Lasix, Imdur. Increase Lasix back to 40mg  BID. BP labile: continue to monitor.  Vitals:   10/09/20 0839 10/09/20 1246  BP: (!) 145/69 (!) 152/72  Pulse: 69 70  Resp:  18  Temp:  98.6 F (37 C)  SpO2: 92% 95%   13. T2DM: Hgb A1C- 5.5 and well controlled on metformin BID PTA             --on hold due to AKI. Will continue to monitor BS ac/hs and use SSI for now  CBGs 128-157: provide dietary education.  14. Acute on chronic CKD  III: BUN/SCr had normalized to 18/1.0 , went up to 1.97. Decrease Lasix to 20mg  BID and Cr improved to 1.61. Will consult  pharm to assist with diflucan dosing given kidney function.  15. Thyroid nodules: Follow up with [facility] after discharge.  16.  Cecal wall thickening: Will need to contact GI when nearing discharge to determine timing of colonoscopy.  17. Severe obesity BMI 36.95: provide dietary education,  18.  Thrombocytosis will monitor platelets, expect this to remain elevated status post splenectomy, trending down to 635 on 10/3. 712 on 10/4  19. Urinary retention: UC+ for >100,000 colonies yeast- switch macrobid to diflucan, discussed with pharmacy and patient 20. Left sided low back pain: continue kpad. 21. Disposition: HFU scheduled  LOS: 7 days A FACE TO FACE EVALUATION WAS PERFORMED  [name] P Parry Po 10/09/2020, 1:21 PM

## 2020-10-09 NOTE — Progress Notes (Signed)
Occupational Therapy Session Note  Patient Details  Name: [name] MRN: [id number] Date of Birth: 02-08-1965  Today's Date: 10/09/2020 OT Individual Time: 9532-0233 OT Individual Time Calculation (min): 50 min    Short Term Goals: Week 1:  OT Short Term Goal 1 (Week 1): Pt will bathe lower body with mod A OT Short Term Goal 2 (Week 1): Pt will dress lower body with max A OT Short Term Goal 3 (Week 1): Pt will complete lateral weight shifting with mod A in preparation for LB dressing OT Short Term Goal 4 (Week 1): Pt will complete toileting tasks with Max A OT Short Term Goal 5 (Week 1): Pt will complete toileting transfer with Max A  Skilled Therapeutic Interventions/Progress Updates:    Pt received in wc with no c/o pain and agreeable to OT session focusing on strengthening for functional independence in BADL tasks & transfers. Pt declined ADLs and requests to work on her mobility for self-care. Additional time required during tx for skilled monitoring of vitals. Pt on RA upon OTS arrival with SPO2 at 94%. SPO2 dropped to 84% with activity on RA, but back at 94% on 1L O2 within 30 secs. Pt self-propelled wc to all therapeutic destinations to work on activity tolerance for functional transfers.    Therapeutic exercise Pt completes 2 x 10 dowel rod therex for BUE shoulder strengthening required for BADLs/functional transfers as follows with demo cuing and 3 # dowel rod. Pt completes two sets of reps with 2 ~30 sec rest breaks.  Shoulder flex/ext, shoulder press, horizonal ab/adduct Elbow flex/ext, chest press Tricep ext/flex with weighted ball overhead   Pt left at end of session in wc with exit alarm on, NT present, call light in reach and all needs met   Therapy Documentation Precautions:  Precautions Precautions: Fall Precaution Comments: R BKA prosthesis not able to don 2/2 edema. Restrictions Weight Bearing Restrictions: No    Therapy/Group: Individual  Therapy  Sirius Woodford 10/09/2020, 6:58 AM

## 2020-10-10 DIAGNOSIS — R5381 Other malaise: Secondary | ICD-10-CM | POA: Diagnosis not present

## 2020-10-10 LAB — GLUCOSE, CAPILLARY
Glucose-Capillary: 101 mg/dL — ABNORMAL HIGH (ref 70–99)
Glucose-Capillary: 110 mg/dL — ABNORMAL HIGH (ref 70–99)
Glucose-Capillary: 120 mg/dL — ABNORMAL HIGH (ref 70–99)
Glucose-Capillary: 145 mg/dL — ABNORMAL HIGH (ref 70–99)

## 2020-10-10 MED ORDER — OXYCODONE HCL 5 MG PO TABS
5.0000 mg | ORAL_TABLET | Freq: Three times a day (TID) | ORAL | Status: DC | PRN
  Administered 2020-10-10 – 2020-10-15 (×7): 5 mg via ORAL
  Filled 2020-10-10 (×7): qty 1

## 2020-10-10 NOTE — Progress Notes (Signed)
Physical Therapy Weekly Progress Note  Patient Details  Name: [name] MRN: [id number] Date of Birth: [birth year]  Beginning of progress report period: October 03, 2020 End of progress report period: October 10, 2020  Today's Date: 10/10/2020 PT Individual Time: 1300-1410 PT Individual Time Calculation (min): 70 min   Patient has met 2 of 3 short term goals. Pt demonstrates gradual progress towards long term goals. Pt is currently able to perform bed mobility mod I, slideboard transfers to L/R with CGA/min A with total A to place board, but heavy mod to mod +2 for squat<>pivot/lateral scoot transfers, and multiple trials with heavy max A to stand in // bars. Pt continues to be limited by difficulty with motor planning/sequencing, decreased problem solving, generalized weakness/deconditioning, and decreased balance/postural control. Pt also requires cues/education on purpose of wearing O2.   Patient continues to demonstrate the following deficits muscle weakness and muscle joint tightness, decreased cardiorespiratoy endurance, impaired timing and sequencing, decreased coordination, and decreased motor planning, decreased problem solving, decreased memory, and delayed processing, and decreased sitting balance, decreased standing balance, decreased postural control, decreased balance strategies, and difficulty maintaining precautions and therefore will continue to benefit from skilled PT intervention to increase functional independence with mobility.  Patient progressing toward long term goals..  Continue plan of care.  PT Short Term Goals Week 1:  PT Short Term Goal 1 (Week 1): Pt will transfer sup <> sit w/ mod I. PT Short Term Goal 1 - Progress (Week 1): Met PT Short Term Goal 2 (Week 1): Pt will transfer bed <> w/c w/ max A using slide board. PT Short Term Goal 2 - Progress (Week 1): Met PT Short Term Goal 3 (Week 1): Pt will transfer sit to stand w/ max A PT Short Term Goal 3 -  Progress (Week 1): Met Week 2:  PT Short Term Goal 1 (Week 2): pt will transfer bed<>WC with LRAD and CGA consistantly PT Short Term Goal 2 (Week 2): pt will transfer sit<>stand with LRAD and mod A PT Short Term Goal 3 (Week 2): pt will perform simulated car transfer with LRAD and min A  Skilled Therapeutic Interventions/Progress Updates:  Discharge planning;DME/adaptive equipment instruction;Functional mobility training;Therapeutic Activities;UE/LE Strength taining/ROM;Neuromuscular re-education;Patient/family education;Therapeutic Exercise;UE/LE Coordination activities;Wheelchair propulsion/positioning   Today's Interventions:  Received pt semi-reclined in bed, pt agreeable to PT treatment, and denied any pain during session. Pt did not have on O2 upon therapist's entry, sats on RA 75%; donned O2 and O2 sat increased to 93% on 1L and remained >93% throughout remainder of session. Educated pt on purpose of wearing O2, however pt demonstrates poor compliance. Session with emphasis on functional mobility/transfers, generalized strengthening, dynamic sitting balance/coordination, and improved activity tolerance. Pt transferred semi-reclined<>sitting EOB mod I and donned L shoe with supervision. Pt transferred bed<>WC via slideboard with CGA and total A to place board and stabilize WC. Pt performed WC mobility 145f using BUE and supervision to main therapy gym with emphasis on UE strengthening. Pt transferred WC<>mat with slideboard and CGA and required max encouragement to attempt to stand with stedy. Provided max A but pt unable to unweight buttocks and with difficulty fitting hips through space. Attempted to locate bariatric stedy, but unable to find one. Pt performed the following exercises with supervision and verbal cues for technique with emphasis on UE/LE/core strengthening: -trunk rotations 2x10 bilaterally with 4.4lb medicine ball  -hamstring curls with grn TB 2x12 on LLE -hip abduction with grn  TB 2x10 Pt transferred sit<>semi-relined on  wedge mod I and performed the following exercises with supervision and verbal cues for technique: -SLR 2x10 bilaterally -hip abduction 2x10 bilaterally -single leg bridge with bolster under R residual limb 2x8 Transferred semi-reclined<>sitting EOM mod I. Encouraged pt to place slideboard to improve independence with transfers but pt required min cues and ultimately total A to place. Pt transferred mat<>WC via slideboard with CGA and educated on WC parts management including donning/doffing amputee support pad, removing armrests, and locking/unlocking brakes - continues to require min cues overall. Pt transported back to room in [facility] total A. Concluded session with pt sitting in WC, needs within reach, and seatbelt alarm on.   Therapy Documentation Precautions:  Precautions Precautions: Fall Precaution Comments: R BKA prosthesis not able to don 2/2 edema. Restrictions Weight Bearing Restrictions: No  Therapy/Group: Individual Therapy Alfonse Alpers PT, DPT  10/10/2020, 7:38 AM

## 2020-10-10 NOTE — Progress Notes (Signed)
PROGRESS NOTE   Subjective/Complaints: No complaints this morning Voiding better Doing well with therapy  ROS: denies shortness of breath, +left sided low back pain, urinary retention improved, +mild phantom limb pain   Objective:   No results found. No results for input(s): WBC, HGB, HCT, PLT in the last 72 hours.  No results for input(s): NA, K, CL, CO2, GLUCOSE, BUN, CREATININE, CALCIUM in the last 72 hours.   Intake/Output Summary (Last 24 hours) at 10/10/2020 1214 Last data filed at 10/10/2020 0851 Gross per 24 hour  Intake 420 ml  Output 400 ml  Net 20 ml        Physical Exam: Vital Signs Blood pressure (!) 158/72, pulse 70, temperature 98.5 F (36.9 C), temperature source Oral, resp. rate 14, height 5\' 7"  (1.702 m), weight 110.5 kg, SpO2 96 %. Gen: no distress, normal appearing HEENT: oral mucosa pink and moist, NCAT Cardio: Reg rate Chest: normal effort, normal rate of breathing Abd: soft, non-distended Ext: no edema Psych: pleasant, normal affect Genitourinary:    Comments: Purewick in place Musculoskeletal:     Cervical back: Normal range of motion and neck supple.     Comments: Ues- 4+/5 except 4/5 in FA B/L Les; RLE- 4+/5 in HF/KE/KF- old R BKA LLE- HF 4-/5, distally 4+/5   Assessment/Plan: 1. Functional deficits which require 3+ hours per day of interdisciplinary therapy in a comprehensive inpatient rehab setting. Physiatrist is providing close team supervision and 24 hour management of active medical problems listed below. Physiatrist and rehab team continue to assess barriers to discharge/monitor patient progress toward functional and medical goals  Care Tool:  Bathing    Body parts bathed by patient: Face, Right arm, Left arm, Chest, Abdomen   Body parts bathed by helper: Front perineal area, Buttocks Body parts n/a: Right lower leg   Bathing assist Assist Level: Maximal Assistance -  Patient 24 - 49%     Upper Body Dressing/Undressing Upper body dressing   What is the patient wearing?: Pull over shirt    Upper body assist Assist Level: Supervision/Verbal cueing    Lower Body Dressing/Undressing Lower body dressing      What is the patient wearing?: Pants     Lower body assist Assist for lower body dressing: Moderate Assistance - Patient 50 - 74% Assistive Device Comment: Rolling R<>L supine at bed level   Toileting Toileting    Toileting assist Assist for toileting: Total Assistance - Patient < 25%     Transfers Chair/bed transfer  Transfers assist     Chair/bed transfer assist level: Contact Guard/Touching assist (slideboard)     Locomotion Ambulation   Ambulation assist   Ambulation activity did not occur: Safety/medical concerns          Walk 10 feet activity   Assist  Walk 10 feet activity did not occur: Safety/medical concerns        Walk 50 feet activity   Assist Walk 50 feet with 2 turns activity did not occur: Safety/medical concerns         Walk 150 feet activity   Assist Walk 150 feet activity did not occur: Safety/medical concerns  Walk 10 feet on uneven surface  activity   Assist Walk 10 feet on uneven surfaces activity did not occur: Safety/medical concerns         Wheelchair     Assist Is the patient using a wheelchair?: Yes Type of Wheelchair: Manual    Wheelchair assist level: Supervision/Verbal cueing Max wheelchair distance: 150    Wheelchair 50 feet with 2 turns activity    Assist        Assist Level: Supervision/Verbal cueing   Wheelchair 150 feet activity     Assist      Assist Level: Supervision/Verbal cueing   Blood pressure (!) 158/72, pulse 70, temperature 98.5 F (36.9 C), temperature source Oral, resp. rate 14, height 5\' 7"  (1.702 m), weight 110.5 kg, SpO2 96 %.  Medical Problem List and Plan: 1.  Debility secondary to ITP/s/p splenectomy  and old R BKA- hasn't worn her new prosthesis due to fitting issues             -patient may  shower if cover dressing             -ELOS/Goals: 10-14 days- mod I to supervision  Continue CIR PT OT 2.  Impaired mobility: continue Lovenox             -antiplatelet therapy: N/A 3. Intermittent RLE pain: Decrease Oxycodone to 5mg  q8H prn. Provided with a pain relief journal 4. Mood: LCSW to follow for evaluation and support.              -antipsychotic agents: N/A 5. Neuropsych: This patient is capable of making decisions on her own behalf. 6. Skin/Wound Care: Monitor wound for healing.   7. Fluids/Electrolytes/Nutrition: Monitor I/O.  Metabolic package shows mild elevation of BUN and creatinine over baseline.,  Albumin remains low but slightly higher than prior now up to 2.5 8. ITP/pancytopenia/anemia:Improving with Hgb-8.4, WBC 13.3 and platelets -892. Will hold off on aspirin.  9. MRSE bacteremia/HAP: IV antibiotics transitioned to Augmentin thru 10/20/20 10. Left pleural effusion s/p thoracotomy 9/19: To follow up with pulmonary after discharge.                   --encourage pulmonary hygiene with flutter valve.  11. Acute on chronic CHF: Lasix decreased to 40 mg bid. Amlodipine d/c.              --monitor I/O. Check weights daily. Monitor for signs of overload.             --continue Toprol XL, Lasix,  12. HTN: Monitor BP TID--continue Toprol, Lasix, Imdur. Increase Lasix back to 40mg  BID. BP labile: continue to monitor.  Vitals:   10/10/20 0400 10/10/20 0757  BP: 107/77 (!) 158/72  Pulse: 84 70  Resp: 14   Temp: 98.5 F (36.9 C)   SpO2: 96%    13. T2DM: Hgb A1C- 5.5 and well controlled on metformin BID PTA             --on hold due to AKI. Will continue to monitor BS ac/hs and use SSI for now  CBGs 128-157: provide dietary education.  14. Acute on chronic CKD III: BUN/SCr had normalized to 18/1.0 , went up to 1.97. Decrease Lasix to 20mg  BID and Cr improved to 1.61. Will consult  pharm to assist with diflucan dosing given kidney function.  15. Thyroid nodules: Follow up with [facility] after discharge.  16.  Cecal wall thickening: Will need to contact GI when nearing discharge to determine timing  of colonoscopy.  17. Severe obesity BMI 36.95: provide dietary education,  18.  Thrombocytosis will monitor platelets, expect this to remain elevated status post splenectomy, trending down to 635 on 10/3. 712 on 10/4  19. Urinary retention: UC+ for >100,000 colonies yeast- switch macrobid to diflucan, discussed with pharmacy and patient 20. Left sided low back pain: continue kpad. 21. Disposition: HFU scheduled  LOS: 8 days A FACE TO FACE EVALUATION WAS PERFORMED  Clint Bolder P Deepak Bless 10/10/2020, 12:14 PM

## 2020-10-10 NOTE — Plan of Care (Signed)
  Problem: Sit to Stand Goal: LTG:  Patient will perform sit to stand with assistance level (PT) Description: LTG:  Patient will perform sit to stand with assistance level (PT) Flowsheets (Taken 10/10/2020 0742) LTG: PT will perform sit to stand in preparation for functional mobility with assistance level: (upgraded due to improved strength/balance) Minimal Assistance - Patient > 75% Note: upgraded due to improved strength/balance   Problem: RH Bed to Chair Transfers Goal: LTG Patient will perform bed/chair transfers w/assist (PT) Description: LTG: Patient will perform bed to chair transfers with assistance (PT). Flowsheets (Taken 10/10/2020 0742) LTG: Pt will perform Bed to Chair Transfers with assistance level: (upgraded due to improved strength/balance) Supervision/Verbal cueing Note: upgraded due to improved strength/balance   Problem: RH Car Transfers Goal: LTG Patient will perform car transfers with assist (PT) Description: LTG: Patient will perform car transfers with assistance (PT). Flowsheets (Taken 10/10/2020 0742) LTG: Pt will perform car transfers with assist:: (upgraded due to improved strength/balance) Contact Guard/Touching assist Note: upgraded due to improved strength/balance   Problem: RH Furniture Transfers Goal: LTG Patient will perform furniture transfers w/assist (OT/PT) Description: LTG: Patient will perform furniture transfers  with assistance (OT/PT). Flowsheets (Taken 10/10/2020 0742) LTG: Pt will perform furniture transfers with assist:: (upgraded due to improved strength/balance) Supervision/Verbal cueing Note: upgraded due to improved strength/balance

## 2020-10-10 NOTE — Progress Notes (Signed)
Occupational Therapy Session Note  Patient Details  Name: [name] MRN: [id number] Date of Birth: 03-Mar-1965  Today's Date: 10/10/2020 OT Individual Time: 7915-0569 OT Individual Time Calculation (min): 72 min    Short Term Goals: Week 1:  OT Short Term Goal 1 (Week 1): Pt will bathe lower body with mod A OT Short Term Goal 2 (Week 1): Pt will dress lower body with max A OT Short Term Goal 3 (Week 1): Pt will complete lateral weight shifting with mod A in preparation for LB dressing OT Short Term Goal 4 (Week 1): Pt will complete toileting tasks with Max A OT Short Term Goal 5 (Week 1): Pt will complete toileting transfer with Max A  Skilled Therapeutic Interventions/Progress Updates:  Skilled OT intervention completed with focus on ADL retraining to increase functional independence. Pt received upright in bed, agreeable to therapy session, not wearing [place]. SPO2 checked on RA at 90%, had pt donn [place] prior to activity. Transferred in bed > EOB with supervision, then pt completed UB bathing with set up. Pt verbalizing need to use toilet, with therapist encouraging transfer to [facility], however pt refused stating that "you cleaning me in the bed is better." Pt educated on the importance of practicing transfers that pt expects/will need to do at home prior to discharge, with pt stating that "when I am home, I can just do it as normal, but here in the hospital you need to do it for me." Pt positioned on bed pan while in long sitting, with pt needing extra time to have a BM. Completed UB dressing of bra with set up while on bed pan, then required total assist for bed level hygiene management using rolling L<>R, with pt refusing to assist wiping buttocks but did attempt wiping front perineal while on back. Donned brief with total assist, and pt requiring mod A to donn pants with pt using bridging technique through LLE to pull pants over waist. Pt left seated, upright in bed, with bed alarm activated and  all needs in reach at end of session.  Therapy Documentation Precautions:  Precautions Precautions: Fall Precaution Comments: R BKA prosthesis not able to don 2/2 edema. Restrictions Weight Bearing Restrictions: No Pain: Pt with no complaints of pain during session.  Therapy/Group: Individual Therapy  Laiklyn Pilkenton E Hansini Clodfelter 10/10/2020, 12:42 PM

## 2020-10-10 NOTE — Progress Notes (Signed)
Occupational Therapy Weekly Progress Note  Patient Details  Name: [name] MRN: [id number] Date of Birth: Jan 30, 1965  Beginning of progress report period: October 03, 2020 End of progress report period: October 10, 2020  Today's Date: 10/10/2020 OT Individual Time: 8527-7824 OT Individual Time Calculation (min): 43 min    Patient has met 3 of 5 short term goals. Pt has made progress towards goals, demonstrating improvement in level of assist needed for LB bathing and dressing. She progressed from SB transfer bed to w/c with min-mod to CGA, completed bed > drop arm BSC transfer with CGA-min assist. Pt has had several incontinent episodes, with request of utilizing bed pan which has limited pt's willingness with participation in other therapeutic activities, and education and encouragement has been provided to pt about working towards toileting on [facility] as pt is able to feel when she needs to go, but has refused on several accounts to use BSC due to conformability with staff cleaning her at bed level at this time. Plan to progress functional transfers, with pt agreeable after most recent session to practicing bed > BSC transfer without the hygiene aspect first.  Patient continues to demonstrate the following deficits: muscle weakness and decreased initiation, decreased awareness, and delayed processing, decreased balance and therefore will continue to benefit from skilled OT intervention to enhance overall performance with BADLs and functional mobility.  Patient progressing toward long term goals..  Continue plan of care.  OT Short Term Goals Week 1:  OT Short Term Goal 1 (Week 1): Pt will bathe lower body with mod A OT Short Term Goal 1 - Progress (Week 1): Progressing toward goal OT Short Term Goal 2 (Week 1): Pt will dress lower body with max A OT Short Term Goal 2 - Progress (Week 1): Met OT Short Term Goal 3 (Week 1): Pt will complete lateral weight shifting with mod A in  preparation for LB dressing OT Short Term Goal 3 - Progress (Week 1): Met OT Short Term Goal 4 (Week 1): Pt will complete toileting tasks with Max A OT Short Term Goal 4 - Progress (Week 1): Progressing toward goal OT Short Term Goal 5 (Week 1): Pt will complete toileting transfer with Max A OT Short Term Goal 5 - Progress (Week 1): Met Week 2:  OT Short Term Goal 1 (Week 2): Pt will bathe lower body with mod A OT Short Term Goal 2 (Week 2): Pt will dress lower body with mod A OT Short Term Goal 3 (Week 2): Pt will complete lateral weight shifting with min A in preparation for LB dressing OT Short Term Goal 4 (Week 2): Pt will complete toileting tasks with Max A  Skilled Therapeutic Interventions/Progress Updates:  Skilled OT intervention completed with focus on ADL retraining and discussion about rehab goals, and purpose of OT. Pt received seated in w/c with NT in room, requesting to use bed pan to void. Therapist encouraged pt to use [facility] for toileting as pt has completed CGA-min A transfer to [facility] in therapy prior. Pt refused, so SB transferred with CGA w/c > bed. Provided mod A to doff pants/brief, positioned pt on bed pan. Provided education regarding goals for therapy and preparing for discharge by practicing transfers that will be used and required at home for toileting. Pt resistant at first, but then agreeable to trying bed > BSC transfer with therapist next session, with focus on transfer only as pt is overwhelmed with the hygiene aspect of toileting on BSC. While  supine, pt participated in cleaning of front perineal, with donning of brief with total assist. Pt able to roll L<>R with supervision and use bridging through LLE to assist in LB dressing with mod assist provided. Pt with noticeable cough by end of session, checked SPO2, 88% on 1 L, increased O2 to 2 L, with increase to 94%, NT in room and notified of cough. In long sitting, pt completed BUE strengthening HEP with green theraband  including horizontal abduction x10, bicep flexion x10 each arm, shoulder flexion x10 each arm, requiring demonstration, and tactile cues for form. Pt left seated upright in bed, with bed alarm activated, and all needs in reach at end of session.  Therapy Documentation Precautions:  Precautions Precautions: Fall Precaution Comments: R BKA prosthesis not able to don 2/2 edema. Restrictions Weight Bearing Restrictions: No  Pain: Pt with no complaints of pain during session.   Therapy/Group: Individual Therapy  Jaquasia Doscher E Tashona Calk 10/10/2020, 3:37 PM

## 2020-10-11 DIAGNOSIS — E1169 Type 2 diabetes mellitus with other specified complication: Secondary | ICD-10-CM

## 2020-10-11 DIAGNOSIS — E669 Obesity, unspecified: Secondary | ICD-10-CM

## 2020-10-11 DIAGNOSIS — I10 Essential (primary) hypertension: Secondary | ICD-10-CM

## 2020-10-11 LAB — GLUCOSE, CAPILLARY
Glucose-Capillary: 101 mg/dL — ABNORMAL HIGH (ref 70–99)
Glucose-Capillary: 142 mg/dL — ABNORMAL HIGH (ref 70–99)
Glucose-Capillary: 143 mg/dL — ABNORMAL HIGH (ref 70–99)
Glucose-Capillary: 173 mg/dL — ABNORMAL HIGH (ref 70–99)

## 2020-10-11 MED ORDER — ISOSORBIDE MONONITRATE ER 30 MG PO TB24
45.0000 mg | ORAL_TABLET | Freq: Every day | ORAL | Status: DC
  Administered 2020-10-12 – 2020-10-23 (×12): 45 mg via ORAL
  Filled 2020-10-11 (×14): qty 2

## 2020-10-11 NOTE — Progress Notes (Signed)
PROGRESS NOTE   Subjective/Complaints: Pt was asleep when I came in. Awakened easily. Denies any pain or problems this morning.   ROS: Patient denies fever, rash, sore throat, blurred vision, nausea, vomiting, diarrhea, cough, shortness of breath or chest pain, joint or back pain, headache, or mood change.    Objective:   No results found. No results for input(s): WBC, HGB, HCT, PLT in the last 72 hours.  No results for input(s): NA, K, CL, CO2, GLUCOSE, BUN, CREATININE, CALCIUM in the last 72 hours.   Intake/Output Summary (Last 24 hours) at 10/11/2020 1422 Last data filed at 10/10/2020 1654 Gross per 24 hour  Intake 240 ml  Output --  Net 240 ml        Physical Exam: Vital Signs Blood pressure (!) 150/68, pulse 66, temperature 98.4 F (36.9 C), resp. rate 17, height 5\' 7"  (1.702 m), weight 108.3 kg, SpO2 95 %. Constitutional: No distress . Vital signs reviewed. HEENT: NCAT, EOMI, oral membranes moist Neck: supple Cardiovascular: RRR without murmur. No JVD    Respiratory/Chest: CTA Bilaterally without wheezes or rales. Normal effort    GI/Abdomen: BS +, non-tender, non-distended Ext: no clubbing, cyanosis, or edema Psych: pleasant and cooperative  Genitourinary:    Comments: Purewick in place Musculoskeletal:     Cervical back: Normal range of motion and neck supple.     Comments: Ues- 4+/5 except 4/5 in FA B/L Les; RLE- 4+/5 in HF/KE/KF- old R BKA well shaped LLE- HF 4-/5, distally 4+/5   Assessment/Plan: 1. Functional deficits which require 3+ hours per day of interdisciplinary therapy in a comprehensive inpatient rehab setting. Physiatrist is providing close team supervision and 24 hour management of active medical problems listed below. Physiatrist and rehab team continue to assess barriers to discharge/monitor patient progress toward functional and medical goals  Care Tool:  Bathing    Body parts  bathed by patient: Right arm, Left arm, Chest, Abdomen, Front perineal area, Face   Body parts bathed by helper: Buttocks Body parts n/a: Right lower leg   Bathing assist Assist Level: Maximal Assistance - Patient 24 - 49%     Upper Body Dressing/Undressing Upper body dressing   What is the patient wearing?: Pull over shirt, Bra    Upper body assist Assist Level: Supervision/Verbal cueing    Lower Body Dressing/Undressing Lower body dressing      What is the patient wearing?: Pants, Incontinence brief     Lower body assist Assist for lower body dressing: Moderate Assistance - Patient 50 - 74% Assistive Device Comment: Rolling R<>L supine at bed level   Toileting Toileting    Toileting assist Assist for toileting: Total Assistance - Patient < 25%     Transfers Chair/bed transfer  Transfers assist     Chair/bed transfer assist level: Contact Guard/Touching assist (slideboard)     Locomotion Ambulation   Ambulation assist   Ambulation activity did not occur: Safety/medical concerns          Walk 10 feet activity   Assist  Walk 10 feet activity did not occur: Safety/medical concerns        Walk 50 feet activity   Assist Walk 50  feet with 2 turns activity did not occur: Safety/medical concerns         Walk 150 feet activity   Assist Walk 150 feet activity did not occur: Safety/medical concerns         Walk 10 feet on uneven surface  activity   Assist Walk 10 feet on uneven surfaces activity did not occur: Safety/medical concerns         Wheelchair     Assist Is the patient using a wheelchair?: Yes Type of Wheelchair: Manual    Wheelchair assist level: Supervision/Verbal cueing Max wheelchair distance: 150    Wheelchair 50 feet with 2 turns activity    Assist        Assist Level: Supervision/Verbal cueing   Wheelchair 150 feet activity     Assist      Assist Level: Supervision/Verbal cueing   Blood  pressure (!) 150/68, pulse 66, temperature 98.4 F (36.9 C), resp. rate 17, height 5\' 7"  (1.702 m), weight 108.3 kg, SpO2 95 %.  Medical Problem List and Plan: 1.  Debility secondary to ITP/s/p splenectomy and old R BKA- hasn't worn her new prosthesis due to fitting issues             -patient may  shower if cover dressing             -ELOS/Goals: 10-14 days- mod I to supervision  -Continue CIR therapies including PT, OT  2.  Impaired mobility: continue Lovenox             -antiplatelet therapy: N/A 3. Intermittent RLE pain: Decrease Oxycodone to 5mg  q8H prn. Provided with a pain relief journal 4. Mood: LCSW to follow for evaluation and support.              -antipsychotic agents: N/A 5. Neuropsych: This patient is capable of making decisions on her own behalf. 6. Skin/Wound Care: Monitor wound for healing.   7. Fluids/Electrolytes/Nutrition: Monitor I/O.  Metabolic package shows mild elevation of BUN and creatinine over baseline.,  Albumin remains low but slightly higher than prior now up to 2.5 8. ITP/pancytopenia/anemia:Improving with Hgb-8.4, WBC 13.3 and platelets -892. Will hold off on aspirin.  9. MRSE bacteremia/HAP: IV antibiotics transitioned to Augmentin thru 10/20/20 10. Left pleural effusion s/p thoracotomy 9/19: To follow up with pulmonary after discharge.                   --encourage pulmonary hygiene with flutter valve.  11. Acute on chronic CHF: Lasix decreased to 40 mg bid. Amlodipine d/c.              --monitor I/O. Check weights daily. Monitor for signs of overload.             --continue Toprol XL, Lasix,  12. HTN: Monitor BP TID--continue Toprol, Lasix, Imdur. Increase Lasix back to 40mg  BID.   -bp's still elevated. Increase imdur to 45mg  starting 10/9 Vitals:   10/11/20 0404 10/11/20 1320  BP: (!) 171/81 (!) 150/68  Pulse: 66 66  Resp: 14 17  Temp: 98.2 F (36.8 C) 98.4 F (36.9 C)  SpO2: 98% 95%   13. T2DM: Hgb A1C- 5.5 and well controlled on metformin BID  PTA             --on hold due to AKI. Will continue to monitor BS ac/hs and use SSI for now  CBG (last 3)  Recent Labs    10/10/20 2143 10/11/20 0628 10/11/20 1226  GLUCAP 145* 101*  142*    -fair control 10/8 14. Acute on chronic CKD III: BUN/SCr had normalized to 18/1.0 , went up to 1.97. Decrease Lasix to 20mg  BID and Cr improved to 1.61. Will consult pharm to assist with diflucan dosing given kidney function.  15. Thyroid nodules: Follow up with [facility] after discharge.  16.  Cecal wall thickening: Will need to contact GI when nearing discharge to determine timing of colonoscopy.  17. Severe obesity BMI 36.95: provide dietary education,  18.  Thrombocytosis will monitor platelets, expect this to remain elevated status post splenectomy, trending down to 635 on 10/3. 712 on 10/4  19. Urinary retention: UC+ for >100,000 colonies yeast- switch macrobid to diflucan, discussed with pharmacy and patient 20. Left sided low back pain: continue kpad. 21. Disposition: HFU scheduled  LOS: 9 days A FACE TO FACE EVALUATION WAS PERFORMED  12/4 10/11/2020, 2:22 PM

## 2020-10-12 LAB — GLUCOSE, CAPILLARY
Glucose-Capillary: 129 mg/dL — ABNORMAL HIGH (ref 70–99)
Glucose-Capillary: 137 mg/dL — ABNORMAL HIGH (ref 70–99)
Glucose-Capillary: 160 mg/dL — ABNORMAL HIGH (ref 70–99)
Glucose-Capillary: 135 mg/dL — ABNORMAL HIGH (ref 70–99)

## 2020-10-12 NOTE — Progress Notes (Signed)
Physical Therapy Session Note  Patient Details  Name: [name] MRN: [id number] Date of Birth: February 13, 1965  Today's Date: 10/12/2020 PT Individual Time: 9485-4627 PT Individual Time Calculation (min): 15 min   Short Term Goals: Week 2:  PT Short Term Goal 1 (Week 2): pt will transfer bed<>WC with LRAD and CGA consistantly PT Short Term Goal 2 (Week 2): pt will transfer sit<>stand with LRAD and mod A PT Short Term Goal 3 (Week 2): pt will perform simulated car transfer with LRAD and min A  Skilled Therapeutic Interventions/Progress Updates: Pt presented in bed having just received breakfast tray. Request to have time to eat. PTA returned 20 then 30 min later with pt agreeable. Pt denies pain at present. Performed bed mobility with supervision and use of bed features. Pt noted to be in gown, agreeable to don clothes to facilitate transfer to w/c. Pt donned UB clothing with set up and minA for threading pants for time management. As pt threading pants expressing that thinks she may need a BM soon and doesn't want to mess up clothes. Pt then performed scooting to [facility] with CGA as pt was low on bed. Pt returned to long sit in bed and PTA explained that this therapists will attempt to come back later in day. Pt left in bed with call bell within reach and NT [name] notified of pt's disposition.      Therapy Documentation Precautions:  Precautions Precautions: Fall Precaution Comments: R BKA prosthesis not able to don 2/2 edema. Restrictions Weight Bearing Restrictions: No General: PT Amount of Missed Time (min): 30 Minutes PT Missed Treatment Reason: Other (Comment) (meal)   Therapy/Group: Individual Therapy  Jayna Mulnix Cecily [place], PTA  10/12/2020, 12:09 PM

## 2020-10-12 NOTE — Progress Notes (Signed)
Incontinent and continent of B & B this morning. Bladder scan = 46ml's. [name] A

## 2020-10-12 NOTE — Progress Notes (Signed)
Slept good. Pain managed with scheduled meds. Mostly complains of pain to left flank area. Foam dressing in place. LLE with pitting edema. Calluses to left foot. Foot and LLE with dry skin, patient declined need for lotion. Incontinent 2232, bladder scan=0. At 0200 incontinent of urine. [name] A

## 2020-10-13 DIAGNOSIS — D693 Immune thrombocytopenic purpura: Secondary | ICD-10-CM

## 2020-10-13 LAB — BASIC METABOLIC PANEL
Anion gap: 7 (ref 5–15)
BUN: 24 mg/dL — ABNORMAL HIGH (ref 6–20)
CO2: 27 mmol/L (ref 22–32)
Calcium: 9 mg/dL (ref 8.9–10.3)
Chloride: 103 mmol/L (ref 98–111)
Creatinine, Ser: 1.15 mg/dL — ABNORMAL HIGH (ref 0.44–1.00)
GFR, Estimated: 57 mL/min — ABNORMAL LOW (ref >60)
Glucose, Bld: 119 mg/dL — ABNORMAL HIGH (ref 70–99)
Potassium: 4.8 mmol/L (ref 3.5–5.1)
Sodium: 137 mmol/L (ref 135–145)

## 2020-10-13 LAB — GLUCOSE, CAPILLARY
Glucose-Capillary: 121 mg/dL — ABNORMAL HIGH (ref 70–99)
Glucose-Capillary: 92 mg/dL (ref 70–99)
Glucose-Capillary: 95 mg/dL (ref 70–99)
Glucose-Capillary: 148 mg/dL — ABNORMAL HIGH (ref 70–99)

## 2020-10-13 LAB — CBC
HCT: 31.6 % — ABNORMAL LOW (ref 36.0–46.0)
Hemoglobin: 9.2 g/dL — ABNORMAL LOW (ref 12.0–15.0)
MCH: 25.4 pg — ABNORMAL LOW (ref 26.0–34.0)
MCHC: 29.1 g/dL — ABNORMAL LOW (ref 30.0–36.0)
MCV: 87.3 fL (ref 80.0–100.0)
Platelets: 486 10*3/uL — ABNORMAL HIGH (ref 150–400)
RBC: 3.62 MIL/uL — ABNORMAL LOW (ref 3.87–5.11)
RDW: 23.9 % — ABNORMAL HIGH (ref 11.5–15.5)
WBC: 8.3 10*3/uL (ref 4.0–10.5)
nRBC: 0 % (ref 0.0–0.2)

## 2020-10-13 MED ORDER — SALINE SPRAY 0.65 % NA SOLN
1.0000 | Freq: Three times a day (TID) | NASAL | Status: DC
  Administered 2020-10-16 – 2020-10-19 (×6): 1 via NASAL
  Filled 2020-10-13: qty 44

## 2020-10-13 MED ORDER — OXYMETAZOLINE HCL 0.05 % NA SOLN
1.0000 | Freq: Two times a day (BID) | NASAL | Status: AC
  Administered 2020-10-13 – 2020-10-15 (×4): 1 via NASAL
  Filled 2020-10-13: qty 30

## 2020-10-13 MED ORDER — FUROSEMIDE 40 MG PO TABS
50.0000 mg | ORAL_TABLET | Freq: Two times a day (BID) | ORAL | Status: DC
  Administered 2020-10-13 – 2020-10-16 (×6): 50 mg via ORAL
  Filled 2020-10-13 (×6): qty 1

## 2020-10-13 NOTE — Progress Notes (Signed)
Physical Therapy Session Note  Patient Details  Name: [name] MRN: [id number] Date of Birth: 08/19/1965  Today's Date: 10/13/2020 PT Individual Time: 8588-5027 and 7412-8786  PT Individual Time Calculation (min): 53 min and 68 min  Short Term Goals: Week 1:  PT Short Term Goal 1 (Week 1): Pt will transfer sup <> sit w/ mod I. PT Short Term Goal 1 - Progress (Week 1): Met PT Short Term Goal 2 (Week 1): Pt will transfer bed <> w/c w/ max A using slide board. PT Short Term Goal 2 - Progress (Week 1): Met PT Short Term Goal 3 (Week 1): Pt will transfer sit to stand w/ max A PT Short Term Goal 3 - Progress (Week 1): Met Week 2:  PT Short Term Goal 1 (Week 2): pt will transfer bed<>WC with LRAD and CGA consistantly PT Short Term Goal 2 (Week 2): pt will transfer sit<>stand with LRAD and mod A PT Short Term Goal 3 (Week 2): pt will perform simulated car transfer with LRAD and min A  Skilled Therapeutic Interventions/Progress Updates:   Treatment Session 1 Received pt sitting upright in bed just finishing using the bathroom with NT present. Pt agreeable to PT treatment and denied any pain during session. Session with emphasis on functional mobility/transfers, generalized strengthening, dynamic standing balance/coordination, and improved activity tolerance. Pt on 1L O2 via [place] with O2 sat 94%. Pt transferred long sitting<>sitting EOB Mod I and donned pants; returned to supine and rolled L/R to pull pants over hips with cues for technique as pt believed she could stand from EOB and pull pants up demonstrating decreased insight into deficits. Returned to sitting and donned L shoe with supervision and transferred bed<>WC via slideboard to R with CGA and total A to place board and stabilize WC. Pt transported to/from room in [facility] total A for time management purposes. Worked on standing tolerance inside // bars - cues to scoot to edge of chair, for and placement on bars, and anterior weight shifting.  However, pt only able to lift buttocks ~1in from [facility] despite max/total A and numerous attempts due to weakness in hip extensors. However, with +2 assist, pt able to come into upright standing position x 2 trials and maintain static standing balance with min A of 1 and light guarding of L knee and no buckling noted. Pt fixated on being able to stand upon D/C, educated pt on CLOF and impairments and emphasis on maximizing independence, ultimately suggesting pt transfer from [facility] level until prosthetic gets refitted. Concluded session with pt sitting in WC, needs within reach, and seatbelt alarm on.   Treatment Session 2 Received pt sitting in WC, pt agreeable to PT treatment, and denied any pain during session. Session with emphasis on functional mobility/transfers, generalized strengthening, simulated car transfers, dynamic sitting balance/coordination, and improved activity tolerance. Pt on 1L O2 via [place] with O2 sat 89% increasing to 94% with cues for pursed lip breathing. Pt performed WC mobility >330f using BUE and supervision to ortho gym. Pt performed simulated car transfer via slideboard with CGA with total A to place board and cues for technique/problem solving. [facility] and transferred on/off Nustep via slideboard with CGA and total A to place board. Pt performed BUE and LLE strengthening on Nustep at workload 2 for 10 minutes for a total of 225 steps with emphasis on cardiovascular endurance and LLE strength and ROM with 1 rest break - O2 sat >90% on 1L  O2 with activity. Provided pt with HEP and educated on frequency/duration/technique for the following exercises: -Seated Knee Extension (BKA) - 1 x daily - 7 x weekly - 2 sets - 10 reps -Seated Long Arc Quad - 1 x daily - 7 x weekly - 2 sets - 10 reps -Seated March - 1 x daily - 7 x weekly - 2 sets - 10 reps -Supine Active Straight Leg Raise - 1 x daily - 7 x weekly - 2 sets - 10 reps -Supine Hip Abduction AROM - 1 x daily  - 7 x weekly - 2 sets - 10 reps -Supine Gluteal Sets - 1 x daily - 7 x weekly - 3 sets - 10 reps Pt then performed the following exercises sitting in [facility] with supervision and verbal cues for technique: -overhead chest press with 2lb dowel 1x12 and 1x20 -horizontal chest press at 90 degrees with 2lb dowel 1x12 and 1x20 -bicep curls with 9lb dowel 2x10 Pt performed WC mobility 129f using BUE and supervision back to room. Concluded session with pt sitting in WC, needs within reach, and seatbelt alarm on.   Therapy Documentation Precautions:  Precautions Precautions: Fall Precaution Comments: R BKA prosthesis not able to don 2/2 edema. Restrictions Weight Bearing Restrictions: No  Therapy/Group: Individual Therapy AAlfonse AlpersPT, DPT   10/13/2020, 7:27 AM

## 2020-10-13 NOTE — Consult Note (Signed)
Neuropsychological Consultation   Patient:   [name]   DOB:   [birth year]  MR Number:  [id number]  Location:  [name] [address] CENTER A [id number] [place] [address] Dept: [phone] Loc: [phone]           Date of Service:   10/13/2020  Start Time:   8 AM End Time:   9 AM  Provider/Observer:  Arley Phenix, Psy.D.       Clinical Neuropsychologist       Billing Code/Service: [phone]  Chief Complaint:    [name] is a 55 year old female with a past medical history including hypertension, type 2 diabetes, stroke, intracranial hemorrhage, urinary retention, chronic hypoxic respiratory failure, right BKA in 2013, recurrent acute idiopathic thrombocytopenia.  Patient was admitted on 09/12/2020 with fatigue, Hgb 4.5, white blood cell count 2.8, platelets less than 50,000 and AKI.  Fluid overload treated with IV diuresis, 2 units packed red blood cells and hyperkalemia treated with lokelma.  Patient did undergo splenectomy on 9/15.  Postoperatively, the patient developed leukocytosis with fever up to 102.2 and was started on IV antibiotics for sepsis due to staph bacteremia with left pleural effusion.  Patient developed hypoxia with mental status changes and CXR showed slight increase in left mid zone opacity.  Patient placed on antibiotics and continue to be limited by weakness with difficulty standing.  CIR recommended due to functional decline.  Reason for Service:  Patient referred for neuropsychological consultation due to extended hospital stay with coping and adjustment.  Patient had past altered mental status and assessment of current cognition and mental status were also conducted.  Below is the HPI for the current admission.  HPI:  [name]. Dedic is a 55 year old female with history of  HTN, T2DM, stroke, ICH, urinary retention-foley dependent, chronic hypoxic respiratory failure,  R-BKA (CIR 2013), recurrent acute ITP (Dr. Pamelia Hoit) treated with IVIG/Nplate who was admitted on 09/12/20 with fatigue, Hgb 4.5, WBC 2.8, platelets <50,000 and AKI. Patient had not followed up with cancer center and run out of lasix X one months. Fluid overload treated with IV diuresis, 2 units PRBC and hyperkalemia treated with lokelma. She was treated with decadron, Nplate and IVIG. Elevated BS treated with insulin and decadron taper.  She was received aranesp and feraheme for supplement and splenectomy recommended by Dr. Myna Hidalgo. She received triple vaccine-PNA/Haemophilus/meningococcus and was agreeable to undergo splenectomy on 09/15 by Dr. Donell Beers.    Post op developed leucocytosis with fever up to 102.2 and was started ton Vanc/Cefepime/Flagyl for sepsis due to staph epi bacteremia with left pleural effusion. Pleural effusion tapped 09/19 for 500 cc fluid and cultures negative for infection.  She developed hypoxia with MS changes and CXR showed slight increase in left mid zone opacity. CT chest done 09/21 showing complex partially loculated pleural effusion with airspace consolidation in LLL concerning for PNA and assymetric enlargement of left thyroid lobe. CT abdomen was negative for infection but showed cecal wall thickening and Dr. Leone Payor recommended colonoscopy for evaluation once medically appropriate or nearing discharge. Thyroid ultrasound showed two left thyroid masses with recommendations for FNA/surgical evaluation after discharge and to follow up with Dr. Thora Lance for left effusion.     Dr. Renold Don recommends IV antibiotics thorough 09/26 followed by 3 weeks of Augmentin given effusion.  JP drain remains in place and small hematoma suspected adjacent to incision --JP pulled today. Leucocytosis resolving and felt to  be steroid effect. She continues to be limited by weakness and has difficulty with standing. CIR recommended due to functional decline.   Current Status:  Patient was received lying in the  bed and a slightly elevated position.  She was awake and alert with positive mood state.  Patient with some limits and understanding of everything that has been happening to her medically but generally aware of the process this been going on.  Patient acknowledges frustration with her progressive decline she has been having in the past and need for her son to allow her through assistance to get out of her bed at home.  Patient reports that she is feeling better but still struggling with very basic motor functions and strength.  She reports that she has been improving during therapies.  Patient reports that she is motivated to get back to a level of independence where she does not need her son for everything.  Patient denies severe depressive symptoms currently.  Behavioral Observation: [name]  presents as a 55 y.o.-year-old Right African American Female who appeared her stated age. her dress was Appropriate and she was Well Groomed and her manners were Appropriate to the situation.  her participation was indicative of Appropriate behaviors.  There were physical disabilities noted.  she displayed an appropriate level of cooperation and motivation.     Interactions:    Active Appropriate  Attention:   abnormal and attention span appeared shorter than expected for age  Memory:   within normal limits; recent and remote memory intact  Visuo-spatial:  not examined  Speech (Volume):  low  Speech:   normal; normal  Thought Process:  Coherent and Relevant  Though Content:  WNL; not suicidal and not homicidal  Orientation:   person, place, time/date, and situation  Judgment:   Fair  Planning:   Poor  Affect:    Appropriate  Mood:    Euthymic  Insight:   Fair  Intelligence:   normal  M Medical History:   Past Medical History:  Diagnosis Date   Acute exacerbation of CHF (congestive heart failure) (HCC) 04/11/2019   Anemia    Arthritis    "spine, hands" (11/25/2015)   Back  pain    "all my back; probably 3 times/week" (11/25/2015)   Chronic indwelling Foley catheter 03/04/2017   Diabetic foot ulcer associated with type 2 diabetes mellitus (HCC)    Hypertension    Intracerebral hemorrhage (HCC) As a teenager    States she had burst blood vessel as teenager, now with resultant minor visual field and hearing deficits   Neuropathy    Stroke (HCC) ~ 1982   "my feeling on my RLE, right lower eye vision,  & hearing out of my right ear not the same since" (11/25/2015)   Type II diabetes mellitus (HCC)          Patient Active Problem List   Diagnosis Date Noted   Lobar pneumonia, unspecified organism (HCC)    Bacteremia    Abnormal CT scan, colon    Iron deficiency anemia    Pancytopenia (HCC) 09/12/2020   Chronic respiratory failure with hypoxia (HCC)    Chronic ITP (idiopathic thrombocytopenia) (HCC) 03/08/2020   Acute on chronic respiratory failure with hypoxia (HCC) 03/08/2020   Cellulitis of foot 08/02/2019   Nausea and vomiting 06/26/2019   Hematuria 06/26/2019   Hypertensive urgency 06/26/2019   Acute on chronic diastolic CHF (congestive heart failure) (HCC)    CHF (congestive heart failure) (HCC) 04/11/2019  Acute exacerbation of CHF (congestive heart failure) (HCC) 04/11/2019   Leukopenia 04/11/2019   History of ITP 04/11/2019   Idiopathic thrombocytopenic purpura (ITP) (HCC) 11/16/2018   Onychomycosis of toenail 10/16/2018   Cataract of both eyes 10/16/2018   Tinea pedis of left foot 10/16/2018   Decreased vision in both eyes 10/16/2018   CKD (chronic kidney disease) stage 3, GFR 30-59 ml/min (HCC)    Cellulitis of left lower extremity 01/25/2018   Hyperkalemia 01/25/2018   Poor compliance 03/08/2017   Hypoxia 03/07/2017   Thyroid nodule 03/07/2017   Chronic indwelling Foley catheter 03/04/2017   Type II diabetes mellitus (HCC)    Femur fracture (HCC) 03/02/2017   Orthostatic dizziness    Urinary tract infection without hematuria     Enterococcus faecalis infection    Near syncope 11/25/2015   Dehydration 11/25/2015   Orthostatic hypotension 11/25/2015   UTI (urinary tract infection) 11/25/2015   Acute worsening of stage 3 chronic kidney disease (HCC) 09/24/2015   Diabetes mellitus due to underlying condition with chronic kidney disease, without long-term current use of insulin (HCC)    Fever    Status post below knee amputation of right lower extremity (HCC)    Diabetic peripheral neuropathy (HCC)    Neuropathic pain    Benign essential HTN    Folliculitis    Slow transit constipation    Anemia of chronic disease    Bilateral hydronephrosis    Urinary retention    CKD (chronic kidney disease)    Uncontrolled type 2 diabetes mellitus with diabetic nephropathy, without long-term current use of insulin    Vasculopathy    Debility 09/04/2015   DM type 2 with diabetic peripheral neuropathy (HCC)    S/P BKA (below knee amputation) unilateral (HCC)    Medical non-compliance    Benign skin lesion of multiple sites    Tachypnea    Acute blood loss anemia    Neurogenic bladder    Occult blood positive stool    Rectovaginal fistula    Controlled diabetes mellitus type 2 with complications (HCC)    AKI (acute kidney injury) (HCC)    Hydronephrosis determined by ultrasound    Papular rash    Uncontrolled type 2 diabetes mellitus with complication    Paresthesia    Thrombocytopenia (HCC) 08/23/2015   Pressure ulcer 08/23/2015   Severe anemia 08/23/2015   Lower urinary tract infectious disease 08/23/2015   Symptomatic anemia 08/23/2015   Peripheral vascular disease, unspecified (HCC) 04/27/2012   Unilateral complete BKA (HCC) 07/09/2011   Gas gangrene (HCC) 07/02/2011   Sepsis(995.91) 07/02/2011   Acute kidney injury superimposed on chronic kidney disease (HCC) 07/02/2011   Diabetes mellitus (HCC) 11/22/2010   Diabetic foot ulcer associated with type 2 diabetes mellitus (HCC)    Essential hypertension     Arthritis    Neuropathy (HCC)    Intracerebral hemorrhage (HCC)     Psychiatric History:  No prior psychiatric history  Family Med/Psych History:  Family History  Problem Relation Age of Onset   Diabetes Mother    Hypertension Mother    Hyperlipidemia Mother    Diabetes Father    Cancer Father        prostate   Hyperlipidemia Father    Hypertension Father    Diabetes Brother    Peripheral Artery Disease Brother        has had toes amputated   Diabetes Son     Impression/DX:  [name] is a 55 year old female  with a past medical history including hypertension, type 2 diabetes, stroke, intracranial hemorrhage, urinary retention, chronic hypoxic respiratory failure, right BKA in 2013, recurrent acute idiopathic thrombocytopenia.  Patient was admitted on 09/12/2020 with fatigue, Hgb 4.5, white blood cell count 2.8, platelets less than 50,000 and AKI.  Fluid overload treated with IV diuresis, 2 units packed red blood cells and hyperkalemia treated with lokelma.  Patient did undergo splenectomy on 9/15.  Postoperatively, the patient developed leukocytosis with fever up to 102.2 and was started on IV antibiotics for sepsis due to staph bacteremia with left pleural effusion.  Patient developed hypoxia with mental status changes and CXR showed slight increase in left mid zone opacity.  Patient placed on antibiotics and continue to be limited by weakness with difficulty standing.  CIR recommended due to functional decline.  Patient was received lying in the bed and a slightly elevated position.  She was awake and alert with positive mood state.  Patient with some limits and understanding of everything that has been happening to her medically but generally aware of the process this been going on.  Patient acknowledges frustration with her progressive decline she has been having in the past and need for her son to allow her through assistance to get out of her bed at home.  Patient reports that  she is feeling better but still struggling with very basic motor functions and strength.  She reports that she has been improving during therapies.  Patient reports that she is motivated to get back to a level of independence where she does not need her son for everything.  Patient denies severe depressive symptoms currently.  Disposition/Plan:  Today we worked on coping and adjustment issues and assessed cognition.  Patient has improving her orientation and mental status.  Diagnosis:    Idiopathic thrombocytopenic purpura (ITP) (HCC) - Plan: folic acid (FOLVITE) tablet 2 mg         Electronically Signed   _______________________ Arley Phenix, Psy.D. Clinical Neuropsychologist

## 2020-10-13 NOTE — Progress Notes (Signed)
PROGRESS NOTE   Subjective/Complaints: Worked with neuropsych this morning Slept well last night and did not require PRN pain medications. Continues to have some pain to left flank.  Incontinent twice overnight.  Asks about edema   ROS: Patient denies fever, rash, sore throat, blurred vision, nausea, vomiting, diarrhea, cough, shortness of breath or chest pain, joint or back pain, headache, or mood change.    Objective:   No results found. No results for input(s): WBC, HGB, HCT, PLT in the last 72 hours.  No results for input(s): NA, K, CL, CO2, GLUCOSE, BUN, CREATININE, CALCIUM in the last 72 hours.   Intake/Output Summary (Last 24 hours) at 10/13/2020 0857 Last data filed at 10/12/2020 1419 Gross per 24 hour  Intake 472 ml  Output 350 ml  Net 122 ml        Physical Exam: Vital Signs Blood pressure (!) 177/81, pulse 72, temperature 98.1 F (36.7 C), resp. rate 14, height 5\' 7"  (1.702 m), weight 107.9 kg, SpO2 95 %. Gen: no distress, normal appearing HEENT: oral mucosa pink and moist, NCAT Cardio: Reg rate Chest: normal effort, normal rate of breathing Abd: soft, non-distended Ext: no edema Psych: pleasant, normal affect  Genitourinary:    Comments: On bedpan Musculoskeletal:     Cervical back: Normal range of motion and neck supple.     Comments: Ues- 4+/5 except 4/5 in FA B/L Les; RLE- 4+/5 in HF/KE/KF- old R BKA well shaped LLE- HF 4-/5, distally 4+/5   Assessment/Plan: 1. Functional deficits which require 3+ hours per day of interdisciplinary therapy in a comprehensive inpatient rehab setting. Physiatrist is providing close team supervision and 24 hour management of active medical problems listed below. Physiatrist and rehab team continue to assess barriers to discharge/monitor patient progress toward functional and medical goals  Care Tool:  Bathing    Body parts bathed by patient: Right arm,  Left arm, Chest, Abdomen, Front perineal area, Face   Body parts bathed by helper: Buttocks Body parts n/a: Right lower leg   Bathing assist Assist Level: Maximal Assistance - Patient 24 - 49%     Upper Body Dressing/Undressing Upper body dressing   What is the patient wearing?: Pull over shirt, Bra    Upper body assist Assist Level: Supervision/Verbal cueing    Lower Body Dressing/Undressing Lower body dressing      What is the patient wearing?: Pants, Incontinence brief     Lower body assist Assist for lower body dressing: Moderate Assistance - Patient 50 - 74% Assistive Device Comment: Rolling R<>L supine at bed level   Toileting Toileting    Toileting assist Assist for toileting: Total Assistance - Patient < 25%     Transfers Chair/bed transfer  Transfers assist     Chair/bed transfer assist level: Contact Guard/Touching assist (slideboard)     Locomotion Ambulation   Ambulation assist   Ambulation activity did not occur: Safety/medical concerns          Walk 10 feet activity   Assist  Walk 10 feet activity did not occur: Safety/medical concerns        Walk 50 feet activity   Assist Walk 50 feet with 2  turns activity did not occur: Safety/medical concerns         Walk 150 feet activity   Assist Walk 150 feet activity did not occur: Safety/medical concerns         Walk 10 feet on uneven surface  activity   Assist Walk 10 feet on uneven surfaces activity did not occur: Safety/medical concerns         Wheelchair     Assist Is the patient using a wheelchair?: Yes Type of Wheelchair: Manual    Wheelchair assist level: Supervision/Verbal cueing Max wheelchair distance: 150    Wheelchair 50 feet with 2 turns activity    Assist        Assist Level: Supervision/Verbal cueing   Wheelchair 150 feet activity     Assist      Assist Level: Supervision/Verbal cueing   Blood pressure (!) 177/81, pulse 72,  temperature 98.1 F (36.7 C), resp. rate 14, height 5\' 7"  (1.702 m), weight 107.9 kg, SpO2 95 %.  Medical Problem List and Plan: 1.  Debility secondary to ITP/s/p splenectomy and old R BKA- hasn't worn her new prosthesis due to fitting issues             -patient may  shower if cover dressing             -ELOS/Goals: 10-14 days- mod I to supervision  -Continue CIR therapies including PT, OT  2.  Impaired mobility: continue Lovenox             -antiplatelet therapy: N/A 3. Intermittent RLE pain: Decrease Oxycodone to 5mg  q8H prn. Provided with a pain relief journal 4. Mood: LCSW to follow for evaluation and support.              -antipsychotic agents: N/A 5. Neuropsych: This patient is capable of making decisions on her own behalf. 6. Skin/Wound Care: Monitor wound for healing.   7. Fluids/Electrolytes/Nutrition: Monitor I/O.  Metabolic package shows mild elevation of BUN and creatinine over baseline.,  Albumin remains low but slightly higher than prior now up to 2.5 8. ITP/pancytopenia/anemia:Improving with Hgb-8.4, WBC 13.3 and platelets -892. Will hold off on aspirin.  9. MRSE bacteremia/HAP: IV antibiotics transitioned to Augmentin thru 10/20/20 10. Left pleural effusion s/p thoracotomy 9/19: To follow up with pulmonary after discharge.                   --encourage pulmonary hygiene with flutter valve.  11. Acute on chronic CHF: Increase Lasix to 50mg  BID. Amlodipine d/c.              --monitor I/O. Check weights daily. Monitor for signs of overload.             --continue Toprol XL, Lasix,  12. HTN: Monitor BP TID--continue Toprol, Lasix, Imdur. Increase Lasix to 50mg  BID.   -bp's still elevated. Increase imdur to 45mg  starting 10/9 Vitals:   10/12/20 1309 10/13/20 0452  BP: (!) 151/71 (!) 177/81  Pulse: 73 72  Resp: 18 14  Temp: 99.1 F (37.3 C) 98.1 F (36.7 C)  SpO2: 99% 95%   13. T2DM: Hgb A1C- 5.5 and well controlled on metformin BID PTA             --on hold due to AKI.  Will continue to monitor BS ac/hs and use SSI for now  CBG (last 3)  Recent Labs    10/12/20 1641 10/12/20 2151 10/13/20 0632  GLUCAP 160* 135* 121*    -fair  control 10/9 14. Acute on chronic CKD III: BUN/SCr had normalized to 18/1.0 , went up to 1.97. Decrease Lasix to 20mg  BID and Cr improved to 1.61. Will consult pharm to assist with diflucan dosing given kidney function. Check creatinine today 15. Thyroid nodules: Follow up with [facility] after discharge.  16.  Cecal wall thickening: Will need to contact GI when nearing discharge to determine timing of colonoscopy.  17. Severe obesity BMI 36.95: provide dietary education,  18.  Thrombocytosis will monitor platelets, expect this to remain elevated status post splenectomy, trending down to 635 on 10/3. 712 on 10/4  19. Urinary retention: UC+ for >100,000 colonies yeast- switch macrobid to diflucan, discussed with pharmacy and patient 20. Left sided low back pain: continue kpad. 21. Disposition: HFU scheduled  LOS: 11 days A FACE TO FACE EVALUATION WAS PERFORMED  12/4 [name] 10/13/2020, 8:57 AM

## 2020-10-13 NOTE — Progress Notes (Signed)
Slept good. Denied need for PRN pain meds. Pain mostly managed with scheduled meds. Pain mostly to left flank area. Foam dressing in place-C, D, & I. O2 at 2L/M, desats at times. Patient removes nasal cannula at times. At 0200 incontinent of urine, bladder scan=119cc's. At 0530, large incontinent void. Alfredo Martinez A

## 2020-10-13 NOTE — Progress Notes (Signed)
Pt c/o bloody nose. Says that happens about once a week. Nose not bleeding when nurse entered room but reports that had no injury or trauma to area and just started running down her face onto shirt. Notify PA. New orders rec'd.

## 2020-10-13 NOTE — Progress Notes (Signed)
Occupational Therapy Session Note  Patient Details  Name: [name] MRN: [id number] Date of Birth: 07-22-1965  Today's Date: 10/13/2020 OT Individual Time: 6378-5885 OT Individual Time Calculation (min): 58 min    Short Term Goals: Week 2:  OT Short Term Goal 1 (Week 2): Pt will bathe lower body with mod A OT Short Term Goal 2 (Week 2): Pt will dress lower body with mod A OT Short Term Goal 3 (Week 2): Pt will complete lateral weight shifting with min A in preparation for LB dressing OT Short Term Goal 4 (Week 2): Pt will complete toileting tasks with Max A  Skilled Therapeutic Interventions/Progress Updates:  Skilled OT intervention completed with focus on self-care tasks and ADL retraining at bed level and EOB level. Pt received with MD and laboratory services present with pt on bed pan. Pt agreeable to therapy session. Completed hygiene management at bed level using rolling L<>R with therapist providing max assist. Pt agreeable/willing to participate in wiping front perineal, and assisted in positioning of brief. Supine > seated EOB with supervision, then supervision for UB bathing/dressing. Pt was able to thread BLEs in pants with supervision, using figure 4 position, however pt reported feeling the need to have BM. Pt requested bed pan, with therapist encouraging transfer to [facility], however pt stated "there's no time, it's coming." Transitioned EOB > supine in bed with supervision, with pt assisting to doff brief, positioned bed pan and pt left seated on bed pan in long sitting (pt's preference). Discussed goals for therapy and pt expressing frustration with incontinent episodes interrupting therapy session. Informed pt that therapist would address the concern in upcoming team conference. Pt left seated on bed pan, with bed alarm activated and all needs in reach. Notified NT of pt status at departure.  Therapy Documentation Precautions:  Precautions Precautions: Fall Precaution  Comments: R BKA prosthesis not able to don 2/2 edema. Restrictions Weight Bearing Restrictions: No  Pain: Pt with no c/o pain during session.  Therapy/Group: Individual Therapy  Ahni Bradwell E Rashad Obeid 10/13/2020, 12:09 PM

## 2020-10-14 LAB — GLUCOSE, CAPILLARY
Glucose-Capillary: 102 mg/dL — ABNORMAL HIGH (ref 70–99)
Glucose-Capillary: 132 mg/dL — ABNORMAL HIGH (ref 70–99)
Glucose-Capillary: 142 mg/dL — ABNORMAL HIGH (ref 70–99)
Glucose-Capillary: 141 mg/dL — ABNORMAL HIGH (ref 70–99)

## 2020-10-14 NOTE — Progress Notes (Signed)
Occupational Therapy Session Note  Patient Details  Name: [name] MRN: [id number] Date of Birth: 08/02/1965  Today's Date: 10/14/2020 OT Individual Time: 2458-0998 OT Individual Time Calculation (min): 60 min    Short Term Goals: Week 2:  OT Short Term Goal 1 (Week 2): Pt will bathe lower body with mod A OT Short Term Goal 2 (Week 2): Pt will dress lower body with mod A OT Short Term Goal 3 (Week 2): Pt will complete lateral weight shifting with min A in preparation for LB dressing OT Short Term Goal 4 (Week 2): Pt will complete toileting tasks with Max A  Skilled Therapeutic Interventions/Progress Updates:  Skilled OT intervention completed with focus on functional transfers and LB clothing management during toileting. Pt received seated in w/c and agreeable to therapy session. Completed w/c level grooming tasks at sink with set up A. Completed w/c <> BSC slide board transfer with min A, 1 person for w/c hold for safety due to loose break. Pt able to complete lateral/frontal leans for doffing pants/brief with mod-max A. Pt void/BM. Therapist cleaned buttocks at total assist, with pt able to complete front hygiene with residual limb resting on w/c for ease of front access. Donned brief with max assist using leaning techniques, pt assisting to attach brief in the front, with therapist threading pts at total assist for time purposes. Lateral L<>R leans used to donn pants over hips with pt participating and max assist from therapist. Completed SB transfer from w/c > bed with CGA and 1 person for w/c hold due to safety. Pt mod I for bed mobility. Pt left seated in bed, with bed alarm activated and all needs in reach. CSW in room at therapist departure.   Therapy Documentation Precautions:  Precautions Precautions: Fall Precaution Comments: R BKA prosthesis not able to don 2/2 edema. Restrictions Weight Bearing Restrictions: No  Pain: Pt with no c/o pain today.  Therapy/Group:  Individual Therapy  Joeleen Wortley E Dee Paden 10/14/2020, 10:45 AM

## 2020-10-14 NOTE — Patient Care Conference (Signed)
Inpatient RehabilitationTeam Conference and Plan of Care Update Date: 10/14/2020   Time: 1:41 PM    Patient Name: [name]      Medical Record Number: [id number]  Date of Birth: Dec 09, 1965 Sex: Female         Room/Bed: 4W24C/4W24C-01 Payor Info: Payor: [employer] MEDICARE / Plan: BCBS MEDICARE / Product Type: *No Product type* /    Admit Date/Time:  10/02/2020  2:35 PM  Primary Diagnosis:  Debility  Hospital Problems: Principal Problem:   Debility    Expected Discharge Date: Expected Discharge Date: 10/25/20  Team Members Present: Physician leading conference: Dr. Sula Soda Social Worker Present: Dossie Der, LCSW Nurse Present: Kennyth Arnold, RN PT Present: Raechel Chute, PT OT Present: Other (comment) [facility], OT) PPS Coordinator present : Fae Pippin, SLP     Current Status/Progress Goal Weekly Team Focus  Bowel/Bladder   Patient is continent and incontinent  Pt to be continent of stooland blass  Assess B/B   Swallow/Nutrition/ Hydration             ADL's   UB bath/dress seated EOB set up, LB bath/dress Mod assist EOB, toileting total assist (pt willing to wipe front perineal and able at bed level), CGA SB transfer  Mod assist overall, min assist bathing  Transfers specifically bed > BSC, toileting/hygiene management, BUE strengthening   Mobility   bed mobility mod I, slideboard transfers CGA, squat<>pivot/lateral scoots heavy mod A, sit<>stand in // bars max A, WC mobility 166ft supervision  min A sit<>stand, supervision SB transfers, CGA car transfer, mod I WC mobility  functional mobility/transfers, generalized strengthening, dynamic standing balance/coordination, amputee education, endurance, motor planning/sequencing, and D/C planning.   Communication             Safety/Cognition/ Behavioral Observations            Pain   intermittent pain  Pain scale <3/10  assess pain q 4hr and prn   Skin   Abdominal area  heaing well  MASD  improving  No new breakdown  Continue regime and assessment QS     Discharge Planning:  Discussing with pt having MOm come from [place] for a 1-2 weeks to assist her, since will not be mod/i with transfers at discharge. She reports MOm works now. Pt feels can manage at home alone while kids are at school-somewhat unrealistic   Team Discussion: Concerned about swelling in upper legs. Left flank pain. Incontinent B/B. Skin very dry, applying lotion. Still sating in low 70's on room air. Patient doesn't have good insight/awareness of deficits. Can't stand on her own and will likely not be able to at discharge. Can't sequence or motor plan well. Will need O2 at discharge. Patient on target to meet rehab goals: yes, STS in parallel bars is a heavy +2 assist. Transferred to [facility] using slideboard contact guard. Bed mobility is Mod I. Will need HHPT, HHOT, and left elevated leg rest.  *See Care Plan and progress notes for long and short-term goals.   Revisions to Treatment Plan:  MD adjusting medications  Teaching Needs: Family education, medication management, skin/wound care, bowel/bladder management, transfer training, slide board training, balance training, endurance training, safety awareness.  Current Barriers to Discharge: Decreased caregiver support, Medical stability, Home enviroment access/layout, Incontinence, Wound care, Lack of/limited family support, Weight, Weight bearing restrictions, Medication compliance, and New oxygen  Possible Resolutions to Barriers: Education with family, timed toileting, continue current medications, continue education on need for home  oxygen.       Medical Summary Current Status: swelling in bilateral lower extremities, CKD, left sided flank pain, hypoxia, hypertension  Barriers to Discharge: Weight;Medical stability  Barriers to Discharge Comments: swelling in bilateral lower extremities, CKD, left sided flank pain, hypoxia, hypertension Possible  Resolutions to [employer] Focus: increase lasix to 60mg  BID, continue to monitor her creatinine weekly, discussed with patient that she will need oxygen at home, provided dietary education   Continued Need for Acute Rehabilitation Level of Care: The patient requires daily medical management by a physician with specialized training in physical medicine and rehabilitation for the following reasons: Direction of a multidisciplinary physical rehabilitation program to maximize functional independence : Yes Medical management of patient stability for increased activity during participation in an intensive rehabilitation regime.: Yes Analysis of laboratory values and/or radiology reports with any subsequent need for medication adjustment and/or medical intervention. : Yes   I attest that I was present, lead the team conference, and concur with the assessment and plan of the team.   10/14/2020, 1:52 PM

## 2020-10-14 NOTE — Progress Notes (Signed)
Occupational Therapy Session Note  Patient Details  Name: [name] MRN: [id number] Date of Birth: March 12, 1965  Today's Date: 10/14/2020 OT Individual Time: 1100-1157 OT Individual Time Calculation (min): 57 min    Short Term Goals: Week 2:  OT Short Term Goal 1 (Week 2): Pt will bathe lower body with mod A OT Short Term Goal 2 (Week 2): Pt will dress lower body with mod A OT Short Term Goal 3 (Week 2): Pt will complete lateral weight shifting with min A in preparation for LB dressing OT Short Term Goal 4 (Week 2): Pt will complete toileting tasks with Max A  Skilled Therapeutic Interventions/Progress Updates:  Skilled OT intervention completed with focus on functional transfers for toileting. Pt received seated in bed with [place] on, reporting episode of vomiting/nausea but agreeable to therapy session. Pt requested that therapist check brief for incontinence, with small BM present. Pt agreeable to transfer bed > BSC due to the urge to void and finish BM. Therapist donned grip sock with total assist due to time, then positioned slide board with total assist. Pt required min A (additional friction due to having only a brief on buttocks) to SB transfer from bed <> BSC, with verbal cues required for placing LLE on the ground for stability as pt had a tendency to only use BUE to pull/push herself on SB. Pt able to complete lateral weight shifts for therapist to doff/donn brief. Completed hygiene management on BSC with Max A utilizing a frontal lean, and CGA, to clean buttocks, and pt cleaning front perineal while resting back on back rest and propping residual limb on bed for ease of access. Mod A for LB, with pt able to lateral lean L<>R to donn pants up to waist, complete SB transfer then finish donning pants over bottom while rolling L<>R while semi-supine in bed. Education provided about importance of practicing [facility] transfers as well as clothing/hygiene management on the [facility] during therapy, with  or without the need to void/BM to promote safety and readiness for pt's d/c with CLOF and specific home environment. Pt appeared receptive, stating "that was a little less crazy, and less messy than the other time. I might can try that again" Pt left seated in bed, with UB clothing to be completed at set up level. Pt left with bed alarm activated, and all needs in reach at end of session.  Therapy Documentation Precautions:  Precautions Precautions: Fall Precaution Comments: R BKA prosthesis not able to don 2/2 edema. Restrictions Weight Bearing Restrictions: No  Pain: No c/o pain, however pt reported feeling nauseous, which subsided with ginger ale provided intermittently during session.  Therapy/Group: Individual Therapy  [name] E [name] 10/14/2020, 10:46 AM

## 2020-10-14 NOTE — Progress Notes (Signed)
PROGRESS NOTE   Subjective/Complaints: Discussed yesterday's labs with her- improvements in kidney function, WBC, Na Swelling stable- discussed increasing Lasix dose yesterday Discussed team meeting today   ROS: Patient denies fever, rash, sore throat, blurred vision, nausea, vomiting, diarrhea, cough, shortness of breath or chest pain, joint or back pain, headache, or mood change.    Objective:   No results found. Recent Labs    10/13/20 0912  WBC 8.3  HGB 9.2*  HCT 31.6*  PLT 486*    Recent Labs    10/13/20 0912  NA 137  K 4.8  CL 103  CO2 27  GLUCOSE 119*  BUN 24*  CREATININE 1.15*  CALCIUM 9.0     Intake/Output Summary (Last 24 hours) at 10/14/2020 0943 Last data filed at 10/14/2020 0800 Gross per 24 hour  Intake 566 ml  Output --  Net 566 ml        Physical Exam: Vital Signs Blood pressure (!) 166/79, pulse 66, temperature 99.7 F (37.6 C), temperature source Oral, resp. rate 18, height 5\' 7"  (1.702 m), weight 108.9 kg, SpO2 91 %. Gen: no distress, normal appearing HEENT: oral mucosa pink and moist, NCAT Cardio: Reg rate Chest: normal effort, normal rate of breathing Abd: soft, non-distended Ext: no edema Psych: pleasant, normal affect Genitourinary:    Comments: On bedpan Musculoskeletal:     Cervical back: Normal range of motion and neck supple.     Comments: Ues- 4+/5 except 4/5 in FA B/L Les; RLE- 4+/5 in HF/KE/KF- old R BKA well shaped LLE- HF 4-/5, distally 4+/5   Assessment/Plan: 1. Functional deficits which require 3+ hours per day of interdisciplinary therapy in a comprehensive inpatient rehab setting. Physiatrist is providing close team supervision and 24 hour management of active medical problems listed below. Physiatrist and rehab team continue to assess barriers to discharge/monitor patient progress toward functional and medical goals  Care Tool:  Bathing    Body  parts bathed by patient: Right arm, Left arm, Chest, Abdomen, Front perineal area, Face   Body parts bathed by helper: Buttocks Body parts n/a: Right lower leg   Bathing assist Assist Level: Moderate Assistance - Patient 50 - 74%     Upper Body Dressing/Undressing Upper body dressing   What is the patient wearing?: Pull over shirt, Bra    Upper body assist Assist Level: Supervision/Verbal cueing    Lower Body Dressing/Undressing Lower body dressing      What is the patient wearing?: Pants, Incontinence brief     Lower body assist Assist for lower body dressing: Moderate Assistance - Patient 50 - 74% Assistive Device Comment: threaded pants at EOB with supervision, rolling R<>L needed for donning past waist   Toileting Toileting    Toileting assist Assist for toileting: Total Assistance - Patient < 25%     Transfers Chair/bed transfer  Transfers assist     Chair/bed transfer assist level: Contact Guard/Touching assist (slideboard)     Locomotion Ambulation   Ambulation assist   Ambulation activity did not occur: Safety/medical concerns          Walk 10 feet activity   Assist  Walk 10 feet activity did not  occur: Safety/medical concerns        Walk 50 feet activity   Assist Walk 50 feet with 2 turns activity did not occur: Safety/medical concerns         Walk 150 feet activity   Assist Walk 150 feet activity did not occur: Safety/medical concerns         Walk 10 feet on uneven surface  activity   Assist Walk 10 feet on uneven surfaces activity did not occur: Safety/medical concerns         Wheelchair     Assist Is the patient using a wheelchair?: Yes Type of Wheelchair: Manual    Wheelchair assist level: Supervision/Verbal cueing Max wheelchair distance: 150    Wheelchair 50 feet with 2 turns activity    Assist        Assist Level: Supervision/Verbal cueing   Wheelchair 150 feet activity     Assist       Assist Level: Supervision/Verbal cueing   Blood pressure (!) 166/79, pulse 66, temperature 99.7 F (37.6 C), temperature source Oral, resp. rate 18, height 5\' 7"  (1.702 m), weight 108.9 kg, SpO2 91 %.  Medical Problem List and Plan: 1.  Debility secondary to ITP/s/p splenectomy and old R BKA- hasn't worn her new prosthesis due to fitting issues             -patient may  shower if cover dressing             -ELOS/Goals: 10-14 days- mod I to supervision  -Continue CIR therapies including PT, OT  2.  Impaired mobility: continue Lovenox             -antiplatelet therapy: N/A 3. Intermittent RLE pain: Decrease Oxycodone to 5mg  q8H prn. Provided with a pain relief journal 4. Mood: LCSW to follow for evaluation and support.              -antipsychotic agents: N/A 5. Neuropsych: This patient is capable of making decisions on her own behalf. 6. Skin/Wound Care: Monitor wound for healing.   7. Fluids/Electrolytes/Nutrition: Monitor I/O.  Metabolic package shows mild elevation of BUN and creatinine over baseline.,  Albumin remains low but slightly higher than prior now up to 2.5 8. ITP/pancytopenia/anemia:Improving with Hgb-8.4, WBC 13.3 and platelets -892. Will hold off on aspirin.  9. MRSE bacteremia/HAP: IV antibiotics transitioned to Augmentin thru 10/20/20 10. Left pleural effusion s/p thoracotomy 9/19: To follow up with pulmonary after discharge.                   --encourage pulmonary hygiene with flutter valve.  11. Acute on chronic CHF: Increase Lasix to 50mg  BID. Amlodipine d/c.              --monitor I/O. Check weights daily. Monitor for signs of overload.             --continue Toprol XL, Lasix,  12. HTN: Monitor BP TID--continue Toprol, Lasix, Imdur. Increase Lasix to 50mg  BID.   -bp's still elevated. Increase imdur to 45mg  starting 10/9 Vitals:   10/13/20 2028 10/14/20 0605  BP: (!) 153/67 (!) 166/79  Pulse: 70 66  Resp: 19 18  Temp: 98.6 F (37 C) 99.7 F (37.6 C)  SpO2:  97% 91%   13. T2DM: Hgb A1C- 5.5 and well controlled on metformin BID PTA             --on hold due to AKI. Will continue to monitor BS ac/hs and use SSI for now  CBG (last 3)  Recent Labs    10/13/20 1645 10/13/20 2111 10/14/20 0605  GLUCAP 95 148* 102*    -fair control 10/9 14. Acute on chronic CKD III: BUN/SCr had normalized to 18/1.0 , went up to 1.97. Decrease Lasix to 20mg  BID and Cr improved to 1.61, then 1.16- discussed with patient. Will consult pharm to assist with diflucan dosing given kidney function.  15. Thyroid nodules: Follow up with [facility] after discharge.  16.  Cecal wall thickening: Will need to contact GI when nearing discharge to determine timing of colonoscopy.  17. Severe obesity BMI 36.95: provide dietary education,  18.  Thrombocytosis will monitor platelets, expect this to remain elevated status post splenectomy, trending down to 635 on 10/3. 712 on 10/4  19. Urinary retention: UC+ for >100,000 colonies yeast- switch macrobid to diflucan, discussed with pharmacy and patient 20. Left sided low back pain: continue kpad. 21. Leukocytosis: resolved, monitor weekly 21. Disposition: HFU scheduled  LOS: 12 days A FACE TO FACE EVALUATION WAS PERFORMED  12/4 [name] 10/14/2020, 9:43 AM

## 2020-10-14 NOTE — Progress Notes (Signed)
Patient ID: [name], female   DOB: 1965-05-28, 55 y.o.   MRN: [id number]  Met with pt to discuss team conference progress toward her goals and discharge still 10/22. She just had a good OT therapy session. She did tolieting and did her won self care. Discussed with her this is an issue with her children being at school during the day and she will be alone at home. She will need to be able to toliet herself and transfer herself. Again asked if she wanted worker to call her Mom and see if she can come to assist at discharge. She declined this again. She feels she can get to the level she needs to be at for discharge. She is agreeable to home O2 and will look into getting her another wheelchair her current one may be over 13 years old and she may be eligible for one. Will reach out to [employer] to check on this. Continue to work on safe plan for pt.

## 2020-10-14 NOTE — Progress Notes (Signed)
Physical Therapy Session Note  Patient Details  Name: [name] MRN: [id number] Date of Birth: 10/02/1965  Today's Date: 10/14/2020 PT Individual Time: 2725-3664 and 4034-7425  PT Individual Time Calculation (min): 56 min and 24 min  Short Term Goals: Week 1:  PT Short Term Goal 1 (Week 1): Pt will transfer sup <> sit w/ mod I. PT Short Term Goal 1 - Progress (Week 1): Met PT Short Term Goal 2 (Week 1): Pt will transfer bed <> w/c w/ max A using slide board. PT Short Term Goal 2 - Progress (Week 1): Met PT Short Term Goal 3 (Week 1): Pt will transfer sit to stand w/ max A PT Short Term Goal 3 - Progress (Week 1): Met Week 2:  PT Short Term Goal 1 (Week 2): pt will transfer bed<>WC with LRAD and CGA consistantly PT Short Term Goal 2 (Week 2): pt will transfer sit<>stand with LRAD and mod A PT Short Term Goal 3 (Week 2): pt will perform simulated car transfer with LRAD and min A  Skilled Therapeutic Interventions/Progress Updates:   Treatment Session 1 Received pt semi-reclined in bed eating breakfast with RN present administering medications. Pt agreeable to PT treatment and denied any pain during session but requested to finish breakfast prior to starting therapy. Pt found with no O2 on - sat 81% on RA. Educated pt on purpose and importance of wearing O2 and pt donned nasal cannula - O2 sat increased to 94% on 1L. Noted swelling in LLE; therefore located L elevating legrest for WC while pt ate breakfast and requested CSW order one for discharge. Pt reported urge to void and requested therapist check to ensure brief was clean. Encouraged transferring onto bedside commode to simulate home setup, however pt refused stating "you can't clean me as good that way". Rolled L/R mod I with use of bedrails and pt found to be incontinent of bowel and bladder. Removed soiled brief and placed bedpan per pt request. Pt sat upright on bedpan and with increased time, had small loose BM. Performed  peri-care dependently and donned clean brief with max A. Concluded session with pt sitting upright in bed, needs within reach, and bed alarm on.   Treatment Session 2  Received pt semi-reclined in bed with NT present checking vitals. Pt agreeable to PT treatment and denied any pain during session but reported getting sick earlier (but unsure why) and currently feeling weak. Pt not wearing nasal cannula upon therapist arrival - O2 sat 83% on RA. Encouraged pt to wear O2 and sat increased to 91% on 1L. Session with emphasis on dressing,  functional mobility/transfers, generalized strengthening, and improved activity tolerance. Pt requested to finish brushing hair; did so mod I. Donned pants in long sitting/supine with supervision but required max A to completely pull pants over hips via rolling L/R with mod I using bedrails. Supine<>sitting EOB from flat bed mod I and donned L shoe with set up assist. Pt transferred bed<>WC via slideboard with CGA and total A to place board. Concluded session with pt sitting in WC, needs within reach, and seatbelt alarm on. LLE elevated for edema management.   Therapy Documentation Precautions:  Precautions Precautions: Fall Precaution Comments: R BKA prosthesis not able to don 2/2 edema. Restrictions Weight Bearing Restrictions: No  Therapy/Group: Individual Therapy Alfonse Alpers PT, DPT   10/14/2020, 7:24 AM

## 2020-10-15 LAB — GLUCOSE, CAPILLARY
Glucose-Capillary: 113 mg/dL — ABNORMAL HIGH (ref 70–99)
Glucose-Capillary: 127 mg/dL — ABNORMAL HIGH (ref 70–99)
Glucose-Capillary: 136 mg/dL — ABNORMAL HIGH (ref 70–99)
Glucose-Capillary: 165 mg/dL — ABNORMAL HIGH (ref 70–99)

## 2020-10-15 MED ORDER — OXYCODONE HCL 5 MG PO TABS
5.0000 mg | ORAL_TABLET | Freq: Two times a day (BID) | ORAL | Status: DC | PRN
  Administered 2020-10-15 – 2020-10-17 (×3): 5 mg via ORAL
  Filled 2020-10-15 (×3): qty 1

## 2020-10-15 NOTE — Progress Notes (Addendum)
PROGRESS NOTE   Subjective/Complaints: Did well with therapy today- did standing in the parallel bars Continues to have edema and tightness Her left sided flank pain has improved Discussed repeating her creatinine level tomorrow  ROS: Patient denies fever, rash, sore throat, blurred vision, nausea, vomiting, diarrhea, cough, shortness of breath or chest pain, joint or back pain, headache, or mood change.    Objective:   No results found. Recent Labs    10/13/20 0912  WBC 8.3  HGB 9.2*  HCT 31.6*  PLT 486*    Recent Labs    10/13/20 0912  NA 137  K 4.8  CL 103  CO2 27  GLUCOSE 119*  BUN 24*  CREATININE 1.15*  CALCIUM 9.0     Intake/Output Summary (Last 24 hours) at 10/15/2020 1306 Last data filed at 10/15/2020 0901 Gross per 24 hour  Intake 240 ml  Output --  Net 240 ml        Physical Exam: Vital Signs Blood pressure (!) 166/63, pulse 70, temperature 98.3 F (36.8 C), resp. rate 20, height 5\' 7"  (1.702 m), weight 108.9 kg, SpO2 92 %. Gen: no distress, normal appearing HEENT: oral mucosa pink and moist, NCAT Cardio: Reg rate Chest: normal effort, normal rate of breathing Abd: soft, non-distended Ext: no edema Psych: pleasant, normal affect Skin: intact Genitourinary:    Comments: On bedpan Musculoskeletal:     Cervical back: Normal range of motion and neck supple.     Comments: Ues- 4+/5 except 4/5 in FA B/L Les; RLE- 4+/5 in HF/KE/KF- old R BKA well shaped LLE- HF 4-/5, distally 4+/5   Assessment/Plan: 1. Functional deficits which require 3+ hours per day of interdisciplinary therapy in a comprehensive inpatient rehab setting. Physiatrist is providing close team supervision and 24 hour management of active medical problems listed below. Physiatrist and rehab team continue to assess barriers to discharge/monitor patient progress toward functional and medical goals  Care Tool:  Bathing     Body parts bathed by patient: Right arm, Left arm, Chest, Abdomen, Front perineal area, Face   Body parts bathed by helper: Buttocks Body parts n/a: Right lower leg   Bathing assist Assist Level: Moderate Assistance - Patient 50 - 74%     Upper Body Dressing/Undressing Upper body dressing   What is the patient wearing?: Pull over shirt, Bra    Upper body assist Assist Level: Supervision/Verbal cueing    Lower Body Dressing/Undressing Lower body dressing      What is the patient wearing?: Pants, Incontinence brief     Lower body assist Assist for lower body dressing: Moderate Assistance - Patient 50 - 74% Assistive Device Comment: threaded pants at EOB with supervision, rolling R<>L needed for donning past waist   Toileting Toileting    Toileting assist Assist for toileting: Total Assistance - Patient < 25%     Transfers Chair/bed transfer  Transfers assist     Chair/bed transfer assist level: Contact Guard/Touching assist (slideboard)     Locomotion Ambulation   Ambulation assist   Ambulation activity did not occur: Safety/medical concerns          Walk 10 feet activity   Assist  Walk 10 feet activity did not occur: Safety/medical concerns        Walk 50 feet activity   Assist Walk 50 feet with 2 turns activity did not occur: Safety/medical concerns         Walk 150 feet activity   Assist Walk 150 feet activity did not occur: Safety/medical concerns         Walk 10 feet on uneven surface  activity   Assist Walk 10 feet on uneven surfaces activity did not occur: Safety/medical concerns         Wheelchair     Assist Is the patient using a wheelchair?: Yes Type of Wheelchair: Manual    Wheelchair assist level: Supervision/Verbal cueing Max wheelchair distance: 150    Wheelchair 50 feet with 2 turns activity    Assist        Assist Level: Supervision/Verbal cueing   Wheelchair 150 feet activity      Assist      Assist Level: Supervision/Verbal cueing   Blood pressure (!) 166/63, pulse 70, temperature 98.3 F (36.8 C), resp. rate 20, height 5\' 7"  (1.702 m), weight 108.9 kg, SpO2 92 %.  Medical Problem List and Plan: 1.  Debility secondary to ITP/s/p splenectomy and old R BKA- hasn't worn her new prosthesis due to fitting issues             -patient may  shower if cover dressing             -ELOS/Goals: 10-14 days- mod I to supervision  -Continue CIR therapies including PT, OT  2.  Impaired mobility: continue Lovenox             -antiplatelet therapy: N/A 3. Intermittent RLE pain: Decrease Oxycodone to 5mg  q12H prn. Provided with a pain relief journal 4. Mood: LCSW to follow for evaluation and support.              -antipsychotic agents: N/A 5. Neuropsych: This patient is capable of making decisions on her own behalf. 6. Skin/Wound Care: Monitor wound for healing.   7. Fluids/Electrolytes/Nutrition: Monitor I/O.  Metabolic package shows mild elevation of BUN and creatinine over baseline.,  Albumin remains low but slightly higher than prior now up to 2.5 8. ITP/pancytopenia/anemia:Improving with Hgb-8.4, WBC 13.3 and platelets -892. Will hold off on aspirin.  9. MRSE bacteremia/HAP: IV antibiotics transitioned to Augmentin thru 10/20/20 10. Left pleural effusion s/p thoracotomy 9/19: To follow up with pulmonary after discharge.                   --encourage pulmonary hygiene with flutter valve.  11. Acute on chronic CHF: Increase Lasix to 50mg  BID. Amlodipine d/c.              --monitor I/O. Check weights daily. Monitor for signs of overload.             --continue Toprol XL, Lasix,  12. HTN: Monitor BP TID--continue Toprol, Lasix, Imdur. Increase Lasix to 50mg  BID.   -bp's still elevated. Increase imdur to 45mg  starting 10/9 Vitals:   10/15/20 0617 10/15/20 0846  BP: (!) 169/76 (!) 166/63  Pulse: 66 70  Resp: 20   Temp: 98.3 F (36.8 C)   SpO2: 92%    13. T2DM: Hgb  A1C- 5.5 and well controlled on metformin BID PTA             --on hold due to AKI. Will continue to monitor BS ac/hs and use SSI for now  CBG (last 3)  Recent Labs    10/14/20 2127 10/15/20 0618 10/15/20 1201  GLUCAP 141* 113* 127*    -fair control 10/9 14. Acute on chronic CKD III: BUN/SCr had normalized to 18/1.0 , went up to 1.97. Decrease Lasix to 20mg  BID and Cr improved to 1.61, then 1.16- discussed with patient. Will consult pharm to assist with diflucan dosing given kidney function. Repeat creatinine tomorrow 15. Thyroid nodules: Follow up with [facility] after discharge.  16.  Cecal wall thickening: Will need to contact GI when nearing discharge to determine timing of colonoscopy.  17. Severe obesity BMI 36.95: provide dietary education,  18.  Thrombocytosis will monitor platelets, expect this to remain elevated status post splenectomy, trending down to 635 on 10/3. 712 on 10/4  19. Urinary retention: UC+ for >100,000 colonies yeast- switch macrobid to diflucan, discussed with pharmacy and patient 20. Left sided low back pain: continue kpad. 21. Leukocytosis: resolved, monitor weekly 22. Hypoxia: continue oxygen via nasal cannula with goal of 96%. Discussed with patient that she will need oxygen at home.  23. Disposition: HFU scheduled. D/c 10/22  LOS: 13 days A FACE TO FACE EVALUATION WAS PERFORMED  [name] P Jerni Selmer 10/15/2020, 1:06 PM

## 2020-10-15 NOTE — Progress Notes (Signed)
Occupational Therapy Session Note  Patient Details  Name: [name] MRN: [id number] Date of Birth: Jun 04, 1965  Today's Date: 10/15/2020 OT Individual Time: 1303-1400 OT Individual Time Calculation (min): 57 min    Short Term Goals: Week 2:  OT Short Term Goal 1 (Week 2): Pt will bathe lower body with mod A OT Short Term Goal 2 (Week 2): Pt will dress lower body with mod A OT Short Term Goal 3 (Week 2): Pt will complete lateral weight shifting with min A in preparation for LB dressing OT Short Term Goal 4 (Week 2): Pt will complete toileting tasks with Max A  Skilled Therapeutic Interventions/Progress Updates:  Skilled OT intervention completed with focus on functional transfers and toileting management. Pt received seated in w/c at start of session, agreeable to therapy. Completed 2x5 (with rest break needed) w/c push ups to clear buttocks from seat, with pt demonstrating improvement of clearing full buttocks from seat cushion this session. Pt education on purpose of the activity and discussed bathroom set up in pt's home. Educated on safety with transfers and CLOF versus how pt reported doing things normally, with pt demonstrating decreased insight to deficits. Pt completed grooming tasks at sink with set up assist. Completed SB transferred w/c <> BSC with pt attempting to place SB under buttocks however with difficulty of leaning/placing at same time but with improvement during BSC > w/c transfer. Demonstration provided about proper position of SB, and technique of leaning/placing simultaneously. While on [facility], pt reached for washcloths behind her back/under her bottom to simulate wiping after a BM to promote independence with hygiene management during toileting, as pt denied the need to actually use the toilet. Education provided about purpose of activity. Pt left seated in w/c with all needs in reach with PT's arrival for care transition.  Therapy Documentation Precautions:   Precautions Precautions: Fall Precaution Comments: R BKA prosthesis not able to don 2/2 edema. Restrictions Weight Bearing Restrictions: No  Pain: Pt with no c/o of pain during session.   Therapy/Group: Individual Therapy  Alla Sloma E Khai Torbert 10/15/2020, 4:10 PM

## 2020-10-15 NOTE — Progress Notes (Signed)
Physical Therapy Session Note  Patient Details  Name: [name] MRN: [id number] Date of Birth: 07/24/1965  Today's Date: 10/15/2020 PT Individual Time: 0915-1025 and 1400-1424 PT Individual Time Calculation (min): 70 min and 24 min  Short Term Goals: Week 1:  PT Short Term Goal 1 (Week 1): Pt will transfer sup <> sit w/ mod I. PT Short Term Goal 1 - Progress (Week 1): Met PT Short Term Goal 2 (Week 1): Pt will transfer bed <> w/c w/ max A using slide board. PT Short Term Goal 2 - Progress (Week 1): Met PT Short Term Goal 3 (Week 1): Pt will transfer sit to stand w/ max A PT Short Term Goal 3 - Progress (Week 1): Met Week 2:  PT Short Term Goal 1 (Week 2): pt will transfer bed<>WC with LRAD and CGA consistantly PT Short Term Goal 2 (Week 2): pt will transfer sit<>stand with LRAD and mod A PT Short Term Goal 3 (Week 2): pt will perform simulated car transfer with LRAD and min A  Skilled Therapeutic Interventions/Progress Updates:   Treatment Session 1 Received pt semi-reclined in bed with nasal cannula resting on top of nose rather than in it - O2 sat 85% on RA. Cued pt to place inside nose and O2 sat increased to >90% on 1L. Pt agreeable to PT treatment and reported 8/10 pain in low back. RN notified and present to administer pain medication. Session with emphasis on dressing, functional mobility/transfers, generalized strengthening, dynamic standing balance/coordination, and improved activity tolerance. Doffed gown and donned sports bra and pull over shirt and combed hair mod I. Donned pants up to thighs in long sitting/supine mod I, laid down, and rolled L/R mod I using bedrails and required max A to pull pants over hips. Supine<>sitting EOB from flat bed mod I and donned L shoe with supervision. Discussed pt's goals of being able to stand independently. Pt fixated on being able to stand up like she was prior to admission but with significantly decreased insight into current deficits  and level of function. Explained muscle physiology and time periods to regain strength with emphasis on making pt independent from a WC level and working more on standing once prosthetic is properly fitted; pt somewhat receptive to education. Pt transferred bed<>WC via slideboard with CGA and mod A to place board and with therapist stabilizing WC. Pt transported to/from room in WC total A for time management purposes. Pt transferred sit<>stand inside // bars with max A x 4 trials with heavy reliance on pulling up on // bars. Pt required cues to scoot to edge of chair, LLE foot placement underneath her, anterior weight shifting, and hip extension when standing. Pt able to stand for 1 minute intervals with min A fading to CGA and no buckling of LLE. Pt very pleased with progress made during session and MD arrived for morning rounds. Concluded session with pt sitting in WC, needs within reach, and seatbelt alarm on. Pt left on 1 L O2 via [place] with O2 sat 95%.   Treatment Session 2 Received pt sitting in WC finishing OT session, pt agreeable to PT treatment, and denied any pain during session. Session with emphasis on WC mobility, generalized strengthening, and improved activity tolerance. O2 sat 91% on 1L O2 via [place]. Pt performed WC mobility 150ft using BUE and supervision to dayroom. Worked on LLE strengthening on Kinetron at 20 cm/sec for 1 minute x 4 trials with therapist providing manual counter resistance with emphasis on quad/glute   strengthening - cues to lean forward to further engage core. Pt required rest breaks in between due to fatigue. Pt transported back to room in WC total A. Concluded session with pt sitting in WC, needs within reach, and seatbelt alarm on. Provided pt with fresh drink.   Therapy Documentation Precautions:  Precautions Precautions: Fall Precaution Comments: R BKA prosthesis not able to don 2/2 edema. Restrictions Weight Bearing Restrictions: No  Therapy/Group: Individual  Therapy Anna M Johnson Anna Johnson PT, DPT   10/15/2020, 7:39 AM  

## 2020-10-15 NOTE — Progress Notes (Signed)
Occupational Therapy Session Note  Patient Details  Name: RASHON REZEK MRN: [id number] Date of Birth: [birth year]  Today's Date: 10/15/2020 OT Individual Time: 1131-1200 OT Individual Time Calculation (min): 29 min    Short Term Goals: Week 2:  OT Short Term Goal 1 (Week 2): Pt will bathe lower body with mod A OT Short Term Goal 2 (Week 2): Pt will dress lower body with mod A OT Short Term Goal 3 (Week 2): Pt will complete lateral weight shifting with min A in preparation for LB dressing OT Short Term Goal 4 (Week 2): Pt will complete toileting tasks with Max A  Skilled Therapeutic Interventions/Progress Updates:    Pt received seated in w/c brushing hair at sink, no c/o pain, agreeable to therapy. Session focus on activity tolerance, BUE strengthening,  in prep for improved ADL/IADL/func mobility performance + decreased caregiver burden. Self-propelled w/c to and from gym with S. Completed 1x15 of the following with 2 lb dowel rod: chest press, forward/backward circles, shoulder flexion, and ball toss. Able to complete 10 w/c push-ups, but with minimal clearance from seat.  SatO2 at 97+ on 1L via [place] throughout session and no SOB noted.  Pt left seated in w/c with NT present and safety belt  alarm engaged, call bell in reach, and all immediate needs met.    Therapy Documentation Precautions:  Precautions Precautions: Fall Precaution Comments: R BKA prosthesis not able to don 2/2 edema. Restrictions Weight Bearing Restrictions: No  Pain:  No c/o ADL: See Care Tool for more details.  Therapy/Group: Individual Therapy  Volanda Napoleon MS, OTR/L  10/15/2020, 6:45 AM

## 2020-10-16 LAB — BASIC METABOLIC PANEL
Anion gap: 10 (ref 5–15)
BUN: 25 mg/dL — ABNORMAL HIGH (ref 6–20)
CO2: 24 mmol/L (ref 22–32)
Calcium: 8.7 mg/dL — ABNORMAL LOW (ref 8.9–10.3)
Chloride: 100 mmol/L (ref 98–111)
Creatinine, Ser: 1.2 mg/dL — ABNORMAL HIGH (ref 0.44–1.00)
GFR, Estimated: 54 mL/min — ABNORMAL LOW (ref >60)
Glucose, Bld: 103 mg/dL — ABNORMAL HIGH (ref 70–99)
Potassium: 4.8 mmol/L (ref 3.5–5.1)
Sodium: 134 mmol/L — ABNORMAL LOW (ref 135–145)

## 2020-10-16 LAB — GLUCOSE, CAPILLARY
Glucose-Capillary: 99 mg/dL (ref 70–99)
Glucose-Capillary: 123 mg/dL — ABNORMAL HIGH (ref 70–99)
Glucose-Capillary: 119 mg/dL — ABNORMAL HIGH (ref 70–99)

## 2020-10-16 MED ORDER — FUROSEMIDE 40 MG PO TABS
40.0000 mg | ORAL_TABLET | Freq: Two times a day (BID) | ORAL | Status: DC
  Administered 2020-10-16 – 2020-10-17 (×2): 40 mg via ORAL
  Filled 2020-10-16 (×2): qty 1

## 2020-10-16 NOTE — Progress Notes (Signed)
PROGRESS NOTE   Subjective/Complaints: Feels she is getting stronger every day As per Skeet Simmer she is looking happier this week We discussed her labs- creatinine only slightly elevated with increased Lasix dose  ROS: Patient denies fever, rash, sore throat, blurred vision, nausea, vomiting, diarrhea, cough, shortness of breath or chest pain, joint or back pain, headache, or mood change.    Objective:   No results found. No results for input(s): WBC, HGB, HCT, PLT in the last 72 hours.   Recent Labs    10/16/20 0507  NA 134*  K 4.8  CL 100  CO2 24  GLUCOSE 103*  BUN 25*  CREATININE 1.20*  CALCIUM 8.7*     Intake/Output Summary (Last 24 hours) at 10/16/2020 1238 Last data filed at 10/16/2020 0827 Gross per 24 hour  Intake 480 ml  Output --  Net 480 ml        Physical Exam: Vital Signs Blood pressure (!) 153/70, pulse 66, temperature 98.1 F (36.7 C), resp. rate 18, height 5\' 7"  (1.702 m), weight 108.9 kg, SpO2 90 %. Gen: no distress, normal appearing HEENT: oral mucosa pink and moist, NCAT Cardio: Reg rate Chest: normal effort, normal rate of breathing Abd: soft, non-distended Ext: no edema Psych: pleasant, normal affect Skin: intact  Genitourinary:    Comments: On bedpan Musculoskeletal:     Cervical back: Normal range of motion and neck supple.     Comments: Ues- 4+/5 except 4/5 in FA B/L Les; RLE- 4+/5 in HF/KE/KF- old R BKA well shaped LLE- HF 4-/5, distally 4+/5   Assessment/Plan: 1. Functional deficits which require 3+ hours per day of interdisciplinary therapy in a comprehensive inpatient rehab setting. Physiatrist is providing close team supervision and 24 hour management of active medical problems listed below. Physiatrist and rehab team continue to assess barriers to discharge/monitor patient progress toward functional and medical goals  Care Tool:  Bathing    Body parts bathed by  patient: Right arm, Left arm, Chest, Abdomen, Front perineal area, Face   Body parts bathed by helper: Buttocks Body parts n/a: Right lower leg   Bathing assist Assist Level: Moderate Assistance - Patient 50 - 74%     Upper Body Dressing/Undressing Upper body dressing   What is the patient wearing?: Pull over shirt, Bra    Upper body assist Assist Level: Set up assist    Lower Body Dressing/Undressing Lower body dressing      What is the patient wearing?: Underwear/pull up, Pants     Lower body assist Assist for lower body dressing: Moderate Assistance - Patient 50 - 74% Assistive Device Comment: threaded pants at EOB with supervision, rolling R<>L needed for donning past waist   Toileting Toileting    Toileting assist Assist for toileting: Total Assistance - Patient < 25%     Transfers Chair/bed transfer  Transfers assist     Chair/bed transfer assist level: Contact Guard/Touching assist (slideboard)     Locomotion Ambulation   Ambulation assist   Ambulation activity did not occur: Safety/medical concerns          Walk 10 feet activity   Assist  Walk 10 feet activity did not occur:  Safety/medical concerns        Walk 50 feet activity   Assist Walk 50 feet with 2 turns activity did not occur: Safety/medical concerns         Walk 150 feet activity   Assist Walk 150 feet activity did not occur: Safety/medical concerns         Walk 10 feet on uneven surface  activity   Assist Walk 10 feet on uneven surfaces activity did not occur: Safety/medical concerns         Wheelchair     Assist Is the patient using a wheelchair?: Yes Type of Wheelchair: Manual    Wheelchair assist level: Supervision/Verbal cueing Max wheelchair distance: 150    Wheelchair 50 feet with 2 turns activity    Assist        Assist Level: Supervision/Verbal cueing   Wheelchair 150 feet activity     Assist      Assist Level:  Supervision/Verbal cueing   Blood pressure (!) 153/70, pulse 66, temperature 98.1 F (36.7 C), resp. rate 18, height 5\' 7"  (1.702 m), weight 108.9 kg, SpO2 90 %.  Medical Problem List and Plan: 1.  Debility secondary to ITP/s/p splenectomy and old R BKA- hasn't worn her new prosthesis due to fitting issues             -patient may  shower if cover dressing             -ELOS/Goals: 10-14 days- mod I to supervision  -Continue CIR therapies including PT, OT  2.  Impaired mobility: continue Lovenox             -antiplatelet therapy: N/A 3. Intermittent RLE pain: Decrease Oxycodone to 5mg  q12H prn. Provided with a pain relief journal 4. Mood: LCSW to follow for evaluation and support.              -antipsychotic agents: N/A 5. Neuropsych: This patient is capable of making decisions on her own behalf. 6. Skin/Wound Care: Monitor wound for healing.   7. Fluids/Electrolytes/Nutrition: Monitor I/O.  Metabolic package shows mild elevation of BUN and creatinine over baseline.,  Albumin remains low but slightly higher than prior now up to 2.5 8. ITP/pancytopenia/anemia:Improving with Hgb-8.4, WBC 13.3 and platelets -892. Will hold off on aspirin.  9. MRSE bacteremia/HAP: IV antibiotics transitioned to Augmentin thru 10/20/20 10. Left pleural effusion s/p thoracotomy 9/19: To follow up with pulmonary after discharge.                   --encourage pulmonary hygiene with flutter valve.  11. Acute on chronic CHF: Decrease Lasix to 40mg  BID. Amlodipine d/c.              --monitor I/O. Check weights daily. Monitor for signs of overload.             --continue Toprol XL, Lasix,  12. HTN: Monitor BP TID--continue Toprol, Lasix, Imdur. Increase Lasix to 50mg  BID.   -bp's still elevated. Increase imdur to 45mg  starting 10/9 Vitals:   10/15/20 1913 10/16/20 0437  BP: (!) 147/64 (!) 153/70  Pulse: 69 66  Resp: 18 18  Temp: 98.4 F (36.9 C) 98.1 F (36.7 C)  SpO2: 95% 90%   13. T2DM: Hgb A1C- 5.5 and  well controlled on metformin BID PTA             --on hold due to AKI. Will continue to monitor BS ac/hs and use SSI for now  CBG (last 3)  Recent Labs    10/15/20 2138 10/16/20 0600 10/16/20 1113  GLUCAP 165* 99 123*    -fair control 10/9 14. Acute on chronic CKD III: BUN/SCr had normalized to 18/1.0 , went up to 1.97. Decrease Lasix to 20mg  BID and Cr improved to 1.61, then 1.16, then 1.20- discussed with patient. Will consult pharm to assist with diflucan dosing given kidney function. Will decrease Lasix to 40mg  BID 15. Thyroid nodules: Follow up with [facility] after discharge.  16.  Cecal wall thickening: Will need to contact GI when nearing discharge to determine timing of colonoscopy.  17. Severe obesity BMI 36.95: provide dietary education,  18.  Thrombocytosis will monitor platelets, expect this to remain elevated status post splenectomy, trending down to 635 on 10/3. 712 on 10/4  19. Urinary retention: UC+ for >100,000 colonies yeast- switch macrobid to diflucan, discussed with pharmacy and patient 20. Left sided low back pain: continue kpad. 21. Leukocytosis: resolved, monitor weekly 22. Hypoxia: continue oxygen via nasal cannula with goal of 96%. Discussed with patient that she will need oxygen at home.  23. Disposition: HFU scheduled. D/c 10/22  LOS: 14 days A FACE TO FACE EVALUATION WAS PERFORMED  12/4 Smith Mcnicholas 10/16/2020, 12:38 PM

## 2020-10-16 NOTE — Progress Notes (Signed)
Physical Therapy Session Note  Patient Details  Name: WILBA MUTZ MRN: [id number] Date of Birth: 1965-02-26  Today's Date: 10/16/2020 PT Individual Time: 4098-1191 and 1301-1409 PT Individual Time Calculation (min): 38 min and 68 min  Short Term Goals: Week 2:  PT Short Term Goal 1 (Week 2): pt will transfer bed<>WC with LRAD and CGA consistantly PT Short Term Goal 1 - Progress (Week 2): Met PT Short Term Goal 2 (Week 2): pt will transfer sit<>stand with LRAD and mod A PT Short Term Goal 2 - Progress (Week 2): Progressing toward goal PT Short Term Goal 3 (Week 2): pt will perform simulated car transfer with LRAD and min A PT Short Term Goal 3 - Progress (Week 2): Met Week 3:  PT Short Term Goal 1 (Week 3): STG=LTG due to LOS  Skilled Therapeutic Interventions/Progress Updates:  Treatment Session 1 Received pt semi-reclined in bed, pt agreeable to PT treatment, and denied any pain during session. Session with emphasis on functional mobility/transfers, toileting, generalized strengthening, dynamic standing balance/coordination, and improved activity tolerance. O2 sat 91% on 1L O2 via [place]. Pt reported urge to void and therapist encouraged using bedside commode; pt reluctantly agreed. Pt transferred semi-reclined<>sitting EOB with mod I using bedrails and donned L shoe with set up assist. Pt transferred bed<>bedside commode via slideboard to R with light min A with cues for technique and performed tricep push ups x 2 reps while therapist managed clothing dependently. Encouraged pt to be more independent and try lateral leans but pt refused stating "I push up and they pull my pants up and down for me". Pt able to void and performed peri-care with set up assist. Pt then transferred bedside commode<>bed via slideboard to L with CGA. Worked on standing tolerance using RW from extremely elevated EOB. Pt able to stand with heavy max A after x 6 attempts with max cues for hand placement and safety with  technique. Returned to sitting upright in bed with all needs within reach, and bed alarm on. RN present at bedside administering medications.   Treatment Session 2  Received pt sitting upright in bed. Pt annoyed with therapist's timing of session stating "I haven't digested my food yet" and ultimately requesting to have 12-2pm blocked off every day for "Tris time"; informed pt that this is not how rehab works and encouraged participation. Pt insisted that therapist return in 30 minutes for her to "digest". NT approached therapist in hallway reporting pt called to toilet and requested to use bedpan as soon as therapist left room. Therapist returned with NT and encouraged transferring onto bedside commode to simulate home environment and promote independence. Pt initially declined but with maximal encouragement from PT and NT, pt finally agreed. Session with emphasis on functional mobility/transfers, toileting, generalized strengthening, WC mobility, and improved activity tolerance. Pt transferred sitting upight<>sitting EOB mod I and donned L shoe with set up assist. Pt transferred bed<>bedside commode via slideboard to R with min A with NT guarding pt. Performed tricep pushup and NT doffed pants dependently with therapist guarding pt in front. Pt able to void and with BM smears. Pt able to perform peri-care with supervision but required increased time due to having continuous smears while trying to wipe. Performed another x 2 tricep pushups for therapist to pull pants over hips and transferred bedside commode<>bed and bed<>WC via slideboard with CGA and total A to place board. O2 sat 76% on 1L O2 after activity increasing to >90% with cues for pursed lip  breathing. Pt performed WC mobility 156f using BUE and supervision to dayroom and performed the following exercises with supervision and verbal/visual cues for technique: -overhead chest press with 2.75lb dowel 2x12 -horizontal chest press at 90 degrees  with 2.75lb dowel 2x12 -bicep curls with 4lb dumbbell 2x10 bilaterally -WC pushups x10 [facility] Concluded session with pt sitting in WC, needs within reach, and seatbelt alarm on.   Therapy Documentation Precautions:  Precautions Precautions: Fall Precaution Comments: R BKA prosthesis not able to don 2/2 edema. Restrictions Weight Bearing Restrictions: No  Therapy/Group: Individual Therapy AAlfonse AlpersPT, DPT   10/16/2020, 7:37 AM

## 2020-10-16 NOTE — Progress Notes (Signed)
Occupational Therapy Session Note  Patient Details  Name: [name] MRN: [id number] Date of Birth: August 28, 1965  Today's Date: 10/16/2020 OT Individual Time: 1155-2080 OT Individual Time Calculation (min): 53 min    Short Term Goals: Week 2:  OT Short Term Goal 1 (Week 2): Pt will bathe lower body with mod A OT Short Term Goal 2 (Week 2): Pt will dress lower body with mod A OT Short Term Goal 3 (Week 2): Pt will complete lateral weight shifting with min A in preparation for LB dressing OT Short Term Goal 4 (Week 2): Pt will complete toileting tasks with Max A  Skilled Therapeutic Interventions/Progress Updates:  Pt greeted in long sitting in bed sitting on bed pan, pt agreeable to OT intervention. Pt requested assistance for posterior pericare via rolling as pt states "that's what I am used to" MAX A for posterior pericare. Encouraged to attempt pericare independently as much as possible to facilitate independence and work towards upcoming DC. Pt was able to complete anterior pericare with set- up assist. Session focus on BADL reeducation and DC planning. Pt required MAXA to don new brief from bed level, pt is very good at directing level of care. Pt completed dressing from long sitting in bed with set- up assist for UB dressing and LB Dressing. Pt using rolling to pull pants up to waist line with set- up assist. Pt completed UB grooming tasks with set- up assist from bed level. Discussed recommendation to complete SB transfers at home and ADLs from EOB as pt still working on standing however pt continues to reiterate that when she is home it will be different.    pt left supine in bed with bed alarm activated and all needs within reach.                   Therapy Documentation Precautions:  Precautions Precautions: Fall Precaution Comments: R BKA prosthesis not able to don 2/2 edema. Restrictions Weight Bearing Restrictions: Yes  Pain: pt reports no pain during session      Therapy/Group: Individual Therapy  Pollyann Glen [facility] 10/16/2020, 10:12 AM

## 2020-10-16 NOTE — Progress Notes (Signed)
Physical Therapy Weekly Progress Note  Patient Details  Name: [name] MRN: [id number] Date of Birth: September 22, 1965  Beginning of progress report period: October 03, 2020 End of progress report period: October 16, 2020  Patient has met 2 of 3 short term goals. Pt demonstrates slow progress towards long term goals. Pt is currently able to perform bed mobility mod I using bedrails, slideboard transfers with CGA and mod A to place board, lateral scoot/squat<>pivot transfers with mod A/mod +2, sit<>stands in // bars with max A, and WC mobility >14f with supervision. Pt demonstrates decreased awareness and insight into deficits and is limited by weakness, deconditioning, decreased balance/postural control, impaired sequencing, memory deficits, and poor problem solving skills. Pt also requires frequent cues for purpose of wearing O2. On RA, pt's O2 sats have dropped below 80% but increase into the high 90's on 1L via nasal cannula.   Patient continues to demonstrate the following deficits muscle weakness and muscle joint tightness, decreased cardiorespiratoy endurance, impaired timing and sequencing, decreased coordination, and decreased motor planning, decreased awareness, decreased problem solving, decreased safety awareness, and decreased memory, and decreased standing balance, decreased postural control, and decreased balance strategies and therefore will continue to benefit from skilled PT intervention to increase functional independence with mobility.  Patient progressing toward long term goals..  Continue plan of care.  PT Short Term Goals Week 2:  PT Short Term Goal 1 (Week 2): pt will transfer bed<>WC with LRAD and CGA consistantly PT Short Term Goal 1 - Progress (Week 2): Met PT Short Term Goal 2 (Week 2): pt will transfer sit<>stand with LRAD and mod A PT Short Term Goal 2 - Progress (Week 2): Progressing toward goal PT Short Term Goal 3 (Week 2): pt will perform simulated car  transfer with LRAD and min A PT Short Term Goal 3 - Progress (Week 2): Met Week 3:  PT Short Term Goal 1 (Week 3): STG=LTG due to LOS  Skilled Therapeutic Interventions/Progress Updates:  Discharge planning;DME/adaptive equipment instruction;Functional mobility training;Therapeutic Activities;UE/LE Strength taining/ROM;Neuromuscular re-education;Patient/family education;Therapeutic Exercise;UE/LE Coordination activities;Wheelchair propulsion/positioning   Therapy Documentation Precautions:  Precautions Precautions: Fall Precaution Comments: R BKA prosthesis not able to don 2/2 edema. Restrictions Weight Bearing Restrictions: No  Therapy/Group: Individual Therapy AAlfonse AlpersPT, DPT  10/16/2020, 7:30 AM

## 2020-10-17 LAB — GLUCOSE, CAPILLARY
Glucose-Capillary: 105 mg/dL — ABNORMAL HIGH (ref 70–99)
Glucose-Capillary: 97 mg/dL (ref 70–99)
Glucose-Capillary: 127 mg/dL — ABNORMAL HIGH (ref 70–99)
Glucose-Capillary: 118 mg/dL — ABNORMAL HIGH (ref 70–99)

## 2020-10-17 MED ORDER — FUROSEMIDE 40 MG PO TABS
50.0000 mg | ORAL_TABLET | Freq: Two times a day (BID) | ORAL | Status: DC
  Administered 2020-10-17 – 2020-10-21 (×8): 50 mg via ORAL
  Filled 2020-10-17 (×8): qty 1

## 2020-10-17 MED ORDER — OXYCODONE HCL 5 MG PO TABS
5.0000 mg | ORAL_TABLET | Freq: Every day | ORAL | Status: DC | PRN
  Administered 2020-10-19: 5 mg via ORAL
  Filled 2020-10-17: qty 1

## 2020-10-17 NOTE — Progress Notes (Signed)
Occupational Therapy Session Note  Patient Details  Name: [name] MRN: [id number] Date of Birth: [birth year]  Today's Date: 10/17/2020 OT Individual Time: 0903-1001 OT Individual Time Calculation (min): 58 min    Short Term Goals: Week 2:  OT Short Term Goal 1 (Week 2): Pt will bathe lower body with mod A OT Short Term Goal 2 (Week 2): Pt will dress lower body with mod A OT Short Term Goal 3 (Week 2): Pt will complete lateral weight shifting with min A in preparation for LB dressing OT Short Term Goal 4 (Week 2): Pt will complete toileting tasks with Max A  Skilled Therapeutic Interventions/Progress Updates:  Skilled OT intervention completed with focus on functional transfers and ADL retraining. Pt received in long sitting in bed and agreeable to session. Pt with request to void/BM, with pt initially requesting bed pan but was able to encourage pt to use BSC, and pt agreeable. Mod I for bed mobility, EOB > BSC using SB (therapist setting up board) with CGA. Pt BM/void. With heavy encouragement/education provided, pt able to wipe buttocks and front perineal with CGA during L<>R leans. Therapist donned brief with pt able to use both hands to push up on [facility] handles to clear bottom from [facility] for therapist to slide brief under, demonstrating improvement versus lateral leans. Pt was set up for UB dress, and required min A for donning pants this session, with pt demanding therapist to give privacy. Therapist provided privacy, however present for safety, with pt demonstrating increased ability to thread both sides of pants and lateral lean for pants at hips, requiring assist only to get pants over hips with pt completing BSC push up. Encouraged pt to get set up in the w/c for next session, however pt refused. Demanding to get back in bed. Completed SB transfer BSC > bed, doffed shoe with total assist, and bed mobility mod I. Pt left with bed alarm activated and all needs in reach at end of  session.  Therapy Documentation Precautions:  Precautions Precautions: Fall Precaution Comments: R BKA prosthesis not able to don 2/2 edema. Restrictions Weight Bearing Restrictions: Yes  Pain: Pt with c/o back pain from sleeping in bed, with improvement reported with mobility.   Therapy/Group: Individual Therapy  [name] 10/17/2020, 7:41 AM

## 2020-10-17 NOTE — Progress Notes (Signed)
PROGRESS NOTE   Subjective/Complaints: Asks about GOLO- discussed that this can be a helpful supplement with natural ingredients. Discussed importance of choosing whole foods Still with swelling that can be painful especially on left side  ROS: Patient denies fever, rash, sore throat, blurred vision, nausea, vomiting, diarrhea, cough, shortness of breath or chest pain, joint or back pain, headache, or mood change. +left flank pain   Objective:   No results found. No results for input(s): WBC, HGB, HCT, PLT in the last 72 hours.   Recent Labs    10/16/20 0507  NA 134*  K 4.8  CL 100  CO2 24  GLUCOSE 103*  BUN 25*  CREATININE 1.20*  CALCIUM 8.7*     Intake/Output Summary (Last 24 hours) at 10/17/2020 1023 Last data filed at 10/16/2020 2100 Gross per 24 hour  Intake 480 ml  Output --  Net 480 ml        Physical Exam: Vital Signs Blood pressure (!) 148/61, pulse 71, temperature 97.9 F (36.6 C), resp. rate 19, height 5\' 7"  (1.702 m), weight 103.8 kg, SpO2 93 %. Gen: no distress, normal appearing HEENT: oral mucosa pink and moist, NCAT Cardio: Reg rate Chest: normal effort, normal rate of breathing Abd: soft, non-distended Ext: no edema Psych: pleasant, normal affect Skin: intact  Genitourinary:    Comments: On bedpan Musculoskeletal:     Cervical back: Normal range of motion and neck supple.     Comments: Ues- 4+/5 except 4/5 in FA B/L Les; RLE- 4+/5 in HF/KE/KF- old R BKA well shaped LLE- HF 4-/5, distally 4+/5   Assessment/Plan: 1. Functional deficits which require 3+ hours per day of interdisciplinary therapy in a comprehensive inpatient rehab setting. Physiatrist is providing close team supervision and 24 hour management of active medical problems listed below. Physiatrist and rehab team continue to assess barriers to discharge/monitor patient progress toward functional and medical goals  Care  Tool:  Bathing    Body parts bathed by patient: Right arm, Left arm, Chest, Abdomen, Front perineal area, Face   Body parts bathed by helper: Buttocks Body parts n/a: Right lower leg   Bathing assist Assist Level: Moderate Assistance - Patient 50 - 74%     Upper Body Dressing/Undressing Upper body dressing   What is the patient wearing?: Pull over shirt, Bra    Upper body assist Assist Level: Set up assist    Lower Body Dressing/Undressing Lower body dressing      What is the patient wearing?: Underwear/pull up, Pants     Lower body assist Assist for lower body dressing: Moderate Assistance - Patient 50 - 74% Assistive Device Comment: threaded pants at EOB with supervision, rolling R<>L needed for donning past waist   Toileting Toileting    Toileting assist Assist for toileting: Total Assistance - Patient < 25%     Transfers Chair/bed transfer  Transfers assist     Chair/bed transfer assist level: Contact Guard/Touching assist (slideboard)     Locomotion Ambulation   Ambulation assist   Ambulation activity did not occur: Safety/medical concerns          Walk 10 feet activity   Assist  Walk 10  feet activity did not occur: Safety/medical concerns        Walk 50 feet activity   Assist Walk 50 feet with 2 turns activity did not occur: Safety/medical concerns         Walk 150 feet activity   Assist Walk 150 feet activity did not occur: Safety/medical concerns         Walk 10 feet on uneven surface  activity   Assist Walk 10 feet on uneven surfaces activity did not occur: Safety/medical concerns         Wheelchair     Assist Is the patient using a wheelchair?: Yes Type of Wheelchair: Manual    Wheelchair assist level: Supervision/Verbal cueing Max wheelchair distance: 150    Wheelchair 50 feet with 2 turns activity    Assist        Assist Level: Supervision/Verbal cueing   Wheelchair 150 feet activity      Assist      Assist Level: Supervision/Verbal cueing   Blood pressure (!) 148/61, pulse 71, temperature 97.9 F (36.6 C), resp. rate 19, height 5\' 7"  (1.702 m), weight 103.8 kg, SpO2 93 %.  Medical Problem List and Plan: 1.  Debility secondary to ITP/s/p splenectomy and old R BKA- hasn't worn her new prosthesis due to fitting issues             -patient may  shower if cover dressing             -ELOS/Goals: 10-14 days- mod I to supervision  -Continue CIR therapies including PT, OT  2.  Impaired mobility: continue Lovenox             -antiplatelet therapy: N/A 3. Intermittent RLE pain: Decrease Oxycodone to 5mg  daily prn. Provided with a pain relief journal 4. Mood: LCSW to follow for evaluation and support.              -antipsychotic agents: N/A 5. Neuropsych: This patient is capable of making decisions on her own behalf. 6. Skin/Wound Care: Monitor wound for healing.   7. Fluids/Electrolytes/Nutrition: Monitor I/O.  Metabolic package shows mild elevation of BUN and creatinine over baseline.,  Albumin remains low but slightly higher than prior now up to 2.5 8. ITP/pancytopenia/anemia:Improving with Hgb-8.4, WBC 13.3 and platelets -892. Will hold off on aspirin.  9. MRSE bacteremia/HAP: IV antibiotics transitioned to Augmentin thru 10/20/20 10. Left pleural effusion s/p thoracotomy 9/19: To follow up with pulmonary after discharge.                   --encourage pulmonary hygiene with flutter valve.  11. Acute on chronic CHF: Decrease Lasix to 40mg  BID. Amlodipine d/c.              --monitor I/O. Check weights daily. Monitor for signs of overload.             --continue Toprol XL, Lasix,  12. HTN: Monitor BP TID--continue Toprol, Lasix, Imdur. Increase Lasix to 50mg  BID  -bp's still elevated. Increase imdur to 45mg  starting 10/9 Vitals:   10/16/20 2006 10/17/20 0507  BP: (!) 170/77 (!) 148/61  Pulse: 70 71  Resp: 20 19  Temp: 97.7 F (36.5 C) 97.9 F (36.6 C)  SpO2: 100%  93%   13. T2DM: Hgb A1C- 5.5 and well controlled on metformin BID PTA             --on hold due to AKI. Will continue to monitor BS ac/hs and use SSI for now  CBG (last 3)  Recent Labs    10/16/20 1113 10/16/20 1637 10/17/20 0619  GLUCAP 123* 119* 105*    -fair control 10/9 14. Acute on chronic CKD III: BUN/SCr had normalized to 18/1.0 , went up to 1.97. Decreased Lasix to 20mg  BID and Cr improved to 1.61, then 1.16, then 1.20- discussed with patient. Lasix has been uptitrated to 50mg  BID given painful swelling at times, repeat creatinine monday 15. Thyroid nodules: Follow up with [facility] after discharge.  16.  Cecal wall thickening: Will need to contact GI when nearing discharge to determine timing of colonoscopy.  17. Severe obesity BMI 36.95: provide dietary education,  18.  Thrombocytosis will monitor platelets, expect this to remain elevated status post splenectomy, trending down to 635 on 10/3. 712 on 10/4  19. Urinary retention: UC+ for >100,000 colonies yeast- switch macrobid to diflucan, discussed with pharmacy and patient 20. Left sided low back pain: continue kpad. 21. Leukocytosis: resolved, monitor weekly 22. Hypoxia: continue oxygen via nasal cannula with goal of 96%. Discussed with patient that she will need oxygen at home.  23. Disposition: HFU scheduled. D/c 10/22  LOS: 15 days A FACE TO FACE EVALUATION WAS PERFORMED  [name] P Dorsey Charette 10/17/2020, 10:23 AM

## 2020-10-17 NOTE — Progress Notes (Signed)
Occupational Therapy Session Note  Patient Details  Name: [name] MRN: [id number] Date of Birth: 1965/01/29  Today's Date: 10/17/2020 OT Individual Time: 1502-1530 OT Individual Time Calculation (min): 28 min    Short Term Goals: Week 2:  OT Short Term Goal 1 (Week 2): Pt will bathe lower body with mod A OT Short Term Goal 2 (Week 2): Pt will dress lower body with mod A OT Short Term Goal 3 (Week 2): Pt will complete lateral weight shifting with min A in preparation for LB dressing OT Short Term Goal 4 (Week 2): Pt will complete toileting tasks with Max A  Skilled Therapeutic Interventions/Progress Updates:  Skilled OT intervention completed with focus on BUE strengthening. Pt received seated in w/c, agreeable to session, however with agitated mood this session. Discussed therapy progress, and goals attained this date. Pt completed the following BUE strengthening with level 3 theraband:  - Bicep flexion x10 each arm - Shoulder flexion x10 each arm - Horizontal abduction x10 - Chest presses x20  Pt with decreased recall of how to complete exercises from previously assigned HEP, with required demonstrations and verbal/tactile cues needed throughout due to pt's decreased sequencing and motor planning. Pt left seated in w/c, with alarm belt activated, all needs in reach, awaiting arrival of assistance from nursing that was called to toilet at therapist departure.  Therapy Documentation Precautions:  Precautions Precautions: Fall Precaution Comments: R BKA prosthesis not able to don 2/2 edema. Restrictions Weight Bearing Restrictions: Yes  Pain: Pt with no c/o pain during session.   Therapy/Group: Individual Therapy  [name] 10/17/2020, 7:42 AM

## 2020-10-17 NOTE — Progress Notes (Signed)
Occupational Therapy Session Note  Patient Details  Name: [name] MRN: [id number] Date of Birth: [birth year]  Today's Date: 10/17/2020 OT Group Time: 1115-1200 OT Group Time Calculation (min): 45 min  and Today's Date: 10/17/2020 OT Missed Time: 15 Minutes Missed Time Reason: Patient unwilling/refused to participate without medical reason;Other (comment) (pt initially declined group transport but agreeable with encouragement upon second attempt)   Short Term Goals: Week 2:  OT Short Term Goal 1 (Week 2): Pt will bathe lower body with mod A OT Short Term Goal 2 (Week 2): Pt will dress lower body with mod A OT Short Term Goal 3 (Week 2): Pt will complete lateral weight shifting with min A in preparation for LB dressing OT Short Term Goal 4 (Week 2): Pt will complete toileting tasks with Max A  Skilled Therapeutic Interventions/Progress Updates:  Pt participated in group session with a focus on group interaction, BUE strength and endurance.   Pt engaged in seated UB therapeutic activity where pt was instructed to roll large dice. Each number rolled correlated with UB therex/ number of repeptitions listed on board. UB therex included flys, chest presses, punches, bicep curls, upward rows, and over heard presses. Pt completed all therex at least once using 3lb hand weights to promote improved UB strength and endurance for higher level functional mobility. Pt modified therex by alternating BUEs or takign rest breaks as needed. Pt required verbal and visual cues to achieve proper body mechanics during reach exercise.  Session concluded with light UB stretching with 3 min cool down with a focus on deep breathing. Pt actively participating in group noted to encourage other members and partake in group discussions. Pt transported back to room by this OTA where pt left seated in w/c with all needs within reach.   Therapy Documentation Precautions:  Precautions Precautions: Fall Precaution  Comments: R BKA prosthesis not able to don 2/2 edema. Restrictions Weight Bearing Restrictions: Yes General: General OT Amount of Missed Time: 15 Minutes Vital Signs:  Pain: pt reports no pain during group session     Therapy/Group: Group Therapy  Barron Schmid 10/17/2020, 1:23 PM

## 2020-10-17 NOTE — Progress Notes (Signed)
Physical Therapy Session Note  Patient Details  Name: [name] MRN: [id number] Date of Birth: September 19, 1965  Today's Date: 10/17/2020 PT Individual Time: 5670-1410 PT Individual Time Calculation (min): 53 min   Short Term Goals: Week 2:  PT Short Term Goal 1 (Week 2): pt will transfer bed<>WC with LRAD and CGA consistantly PT Short Term Goal 1 - Progress (Week 2): Met PT Short Term Goal 2 (Week 2): pt will transfer sit<>stand with LRAD and mod A PT Short Term Goal 2 - Progress (Week 2): Progressing toward goal PT Short Term Goal 3 (Week 2): pt will perform simulated car transfer with LRAD and min A PT Short Term Goal 3 - Progress (Week 2): Met Week 3:  PT Short Term Goal 1 (Week 3): STG=LTG due to LOS  Skilled Therapeutic Interventions/Progress Updates:   Received pt sitting in WC on phone. Pt requested therapist return in a few minutes to allow her to finish her phone conversation. Upon returning, pt agreeable to PT treatment, and reported mild back pain (unrated). Session with emphasis on functional mobility/transfers, generalized strengthening, dynamic standing balance/coordination, and improved endurance with activity. O2 sat 94% on 1L O2 via [place]. Pt performed WC mobility 148f using BUE and supervision to dayroom. Pt performed x 2 transfers to/from mat using slideboard and CGA with total A to place board with therapist stabilizing WC. Worked on standing tolerance/balance using RW. Pt required x 2 attempts and max A to stand with RW x 6 reps from elevated mat using mirror for visual feedback - cues for upright posture/gaze and to relax shoulders. Pt requested to stand without assist from therapist, but ultimately unable despite 2 attempts. Of note, pt able to stand easier pushing with BUE support on RW rather than 1 UE on RW and 1 UE on mat. On trial #6 standing, pt performed x 8 heel raises on LLE in preparation for stand<>pivot transfers - decreased ROM with activity and difficulty  "feeling foot"; did not attempt stand<>pivot due to safety concerns. Pt performed the following exercises sitting EOM with supervision and verbal/visual cues for technique: -hip flexion 2x10 bilaterally with 1lb ankle weight on LLE -LAQ 2x10 bilaterally with 1lb ankle weight on LLE -hip adduction ball squeezes 2x10 [facility] Concluded session with pt sitting in WC, needs within reach, and seatbelt alarm on.   Therapy Documentation Precautions:  Precautions Precautions: Fall Precaution Comments: R BKA prosthesis not able to don 2/2 edema. Restrictions Weight Bearing Restrictions: Yes  Therapy/Group: Individual Therapy AAlfonse AlpersPT, DPT   10/17/2020, 7:23 AM

## 2020-10-17 NOTE — Progress Notes (Signed)
Occupational Therapy Weekly Progress Note  Patient Details  Name: [name] MRN: [id number] Date of Birth: 07/08/1965  Beginning of progress report period: October 10, 2020 End of progress report period: October 17, 2020   Patient has met 3 of 4 short term goals. Pt is making gradual progress towards long term goals. Pt has improved her willingness, and completion with toileting transfers to BSC vs bedpan, however still requires heavy encouragement. During session this date, pt was CGA for SB transfer bed>BSC, was able to push up from BSC using both arm rests and BUE to clear bottom for clothing management vs lateral weight shifts. Improved assist level needed for overall bathing from max A to CGA during session this date, with heavy encouragement provided for pt to engage in task as much as possible for increased independence. Pt completed all perineal and buttock hygiene management during toileting this date. Pt continues to require max assist for sit >stands. Pt continues to demonstrate decreased awareness of her abilities and especially regarding her expectations for how she will function/assist level needed upon returning home. Pt would benefit from skilled OT to increase independence in ADLs and functional transfers needed for pt's specific home environment/care support.  Patient continues to demonstrate the following deficits: muscle weakness and muscle joint tightness, decreased cardiorespiratoy endurance and decreased oxygen support, impaired timing and sequencing and decreased motor planning, decreased initiation, decreased awareness, decreased problem solving, decreased safety awareness, and decreased memory, and decreased balance and therefore will continue to benefit from skilled OT intervention to enhance overall performance with BADL and iADL and reduce care burden on her high school aged son and pt's mother.  Patient progressing toward long term goals..  Continue plan of  care.  OT Short Term Goals Week 2:  OT Short Term Goal 1 (Week 2): Pt will bathe lower body with mod A OT Short Term Goal 1 - Progress (Week 2): Met OT Short Term Goal 2 (Week 2): Pt will dress lower body with mod A OT Short Term Goal 2 - Progress (Week 2): Met OT Short Term Goal 3 (Week 2): Pt will complete lateral weight shifting with min A in preparation for LB dressing OT Short Term Goal 3 - Progress (Week 2): Progressing toward goal OT Short Term Goal 4 (Week 2): Pt will complete toileting tasks with Max A OT Short Term Goal 4 - Progress (Week 2): Met Week 3:  OT Short Term Goal 1 (Week 3): STG=LTG due to LOS   Hope E Poindexter 10/17/2020, 7:42 AM  

## 2020-10-18 DIAGNOSIS — D75839 Thrombocytosis, unspecified: Secondary | ICD-10-CM

## 2020-10-18 DIAGNOSIS — E871 Hypo-osmolality and hyponatremia: Secondary | ICD-10-CM

## 2020-10-18 DIAGNOSIS — I5043 Acute on chronic combined systolic (congestive) and diastolic (congestive) heart failure: Secondary | ICD-10-CM

## 2020-10-18 LAB — GLUCOSE, CAPILLARY
Glucose-Capillary: 108 mg/dL — ABNORMAL HIGH (ref 70–99)
Glucose-Capillary: 147 mg/dL — ABNORMAL HIGH (ref 70–99)
Glucose-Capillary: 135 mg/dL — ABNORMAL HIGH (ref 70–99)
Glucose-Capillary: 149 mg/dL — ABNORMAL HIGH (ref 70–99)

## 2020-10-18 MED ORDER — AMLODIPINE BESYLATE 2.5 MG PO TABS
2.5000 mg | ORAL_TABLET | Freq: Every day | ORAL | Status: DC
  Administered 2020-10-18 – 2020-10-21 (×4): 2.5 mg via ORAL
  Filled 2020-10-18 (×4): qty 1

## 2020-10-18 NOTE — Progress Notes (Signed)
PROGRESS NOTE   Subjective/Complaints: Patient seen sitting up in bed this morning.  She states she slept well overnight.  ROS: Denies CP, SOB, N/V/D  Objective:   No results found. No results for input(s): WBC, HGB, HCT, PLT in the last 72 hours.   Recent Labs    10/16/20 0507  NA 134*  K 4.8  CL 100  CO2 24  GLUCOSE 103*  BUN 25*  CREATININE 1.20*  CALCIUM 8.7*      Intake/Output Summary (Last 24 hours) at 10/18/2020 1024 Last data filed at 10/18/2020 0954 Gross per 24 hour  Intake 960 ml  Output --  Net 960 ml         Physical Exam: Vital Signs Blood pressure (!) 161/82, pulse 72, temperature 99 F (37.2 C), temperature source Oral, resp. rate 16, height 5\' 7"  (1.702 m), weight 104.1 kg, SpO2 95 %. Constitutional: No distress . Vital signs reviewed. HENT: Normocephalic.  Atraumatic. Eyes: EOMI. No discharge. Cardiovascular: No JVD.  RRR. Respiratory: Normal effort.  No stridor.  Bilateral clear to auscultation.  + [place]. GI: Non-distended.  BS +. Skin: Warm and dry.  Intact. Psych: Normal mood.  Normal behavior. Musc: No edema in extremities.  No tenderness in extremities. Neuro: Alert Motor: Grossly 4/5 throughout  Assessment/Plan: 1. Functional deficits which require 3+ hours per day of interdisciplinary therapy in a comprehensive inpatient rehab setting. Physiatrist is providing close team supervision and 24 hour management of active medical problems listed below. Physiatrist and rehab team continue to assess barriers to discharge/monitor patient progress toward functional and medical goals  Care Tool:  Bathing    Body parts bathed by patient: Right arm, Left arm, Chest, Abdomen, Front perineal area, Right upper leg, Left upper leg, Buttocks, Face   Body parts bathed by helper: Buttocks Body parts n/a: Right lower leg   Bathing assist Assist Level: Contact Guard/Touching assist      Upper Body Dressing/Undressing Upper body dressing   What is the patient wearing?: Pull over shirt, Bra    Upper body assist Assist Level: Set up assist    Lower Body Dressing/Undressing Lower body dressing      What is the patient wearing?: Underwear/pull up, Pants     Lower body assist Assist for lower body dressing: Moderate Assistance - Patient 50 - 74% Assistive Device Comment: threaded pants at EOB with supervision, rolling R<>L needed for donning past waist   Toileting Toileting    Toileting assist Assist for toileting: Moderate Assistance - Patient 50 - 74%     Transfers Chair/bed transfer  Transfers assist     Chair/bed transfer assist level: Contact Guard/Touching assist     Locomotion Ambulation   Ambulation assist   Ambulation activity did not occur: Safety/medical concerns          Walk 10 feet activity   Assist  Walk 10 feet activity did not occur: Safety/medical concerns        Walk 50 feet activity   Assist Walk 50 feet with 2 turns activity did not occur: Safety/medical concerns         Walk 150 feet activity   Assist Walk 150 feet  activity did not occur: Safety/medical concerns         Walk 10 feet on uneven surface  activity   Assist Walk 10 feet on uneven surfaces activity did not occur: Safety/medical concerns         Wheelchair     Assist Is the patient using a wheelchair?: Yes Type of Wheelchair: Manual    Wheelchair assist level: Supervision/Verbal cueing Max wheelchair distance: 150    Wheelchair 50 feet with 2 turns activity    Assist        Assist Level: Supervision/Verbal cueing   Wheelchair 150 feet activity     Assist      Assist Level: Supervision/Verbal cueing   Blood pressure (!) 161/82, pulse 72, temperature 99 F (37.2 C), temperature source Oral, resp. rate 16, height 5\' 7"  (1.702 m), weight 104.1 kg, SpO2 95 %.  Medical Problem List and Plan: 1.  Debility  secondary to ITP/s/p splenectomy and old R BKA- hasn't worn her new prosthesis due to fitting issues  Continue CIR 2.  Impaired mobility: continue Lovenox             -antiplatelet therapy: N/A 3. Intermittent RLE pain: Decrease Oxycodone to 5mg  daily prn. Provided with a pain relief journal  Controlled with meds on 10/15 4. Mood: LCSW to follow for evaluation and support.              -antipsychotic agents: N/A 5. Neuropsych: This patient is capable of making decisions on her own behalf. 6. Skin/Wound Care: Monitor wound for healing.   7. Fluids/Electrolytes/Nutrition: Monitor I/Os.   Albumin remains low but slightly higher than prior now up to 2.5 8. ITP/pancytopenia/anemia:Improving hold off on aspirin.   Hemoglobin 9.2 on 10/10, labs ordered for tomorrow 9. MRSE bacteremia/HAP: IV antibiotics transitioned to Augmentin thru 10/20/20 10. Left pleural effusion s/p thoracotomy 9/19: To follow up with pulmonary after discharge.                   --encourage pulmonary hygiene with flutter valve.  11. Acute on chronic CHF: Decrease Lasix to 40mg  BID.               --monitor I/O. Monitor for signs of overload.             --continue Toprol XL, Lasix  Filed Weights   10/14/20 0605 10/17/20 0507 10/18/20 0506  Weight: 108.9 kg 103.8 kg 104.1 kg   12. HTN: Monitor BP TID--continue Toprol, Lasix, Imdur. Increase Lasix to 50mg  BID  Increased imdur to 45mg  starting 10/9 Vitals:   10/17/20 2010 10/18/20 0506  BP: (!) 160/73 (!) 161/82  Pulse: 71 72  Resp: 16 16  Temp: 98.4 F (36.9 C) 99 F (37.2 C)  SpO2: 93% 95%   Norvasc 2.5 started on 10/15 13. T2DM: Hgb A1C- 5.5 and well controlled on metformin BID PTA             --on hold due to AKI. Will continue to monitor BS ac/hs and use SSI for now  CBG (last 3)  Recent Labs    10/17/20 1659 10/17/20 2125 10/18/20 0601  GLUCAP 127* 118* 108*     Controlled on 10/15 14. Acute on chronic CKD III:  Lasix has been uptitrated to 50mg  BID  given painful swelling at times Creatinine 1.20 on 10/13, labs ordered for Monday 15. Thyroid nodules: Follow up with [facility] after discharge.  16.  Cecal wall thickening: Will need to contact GI when nearing  discharge to determine timing of colonoscopy.  17. Severe obesity BMI 36.95: provide dietary education,  18.  Thrombocytosis will monitor platelets, expect this to remain elevated status post splenectomy  Platelets 46 on 10/10, labs ordered for Monday 19. Urinary retention: UC+ for >100,000 colonies yeast- switched macrobid to diflucan 20. Left sided low back pain: continue kpad. 21. Leukocytosis: resolved, monitor weekly 22. Hypoxia: continue oxygen via nasal cannula with goal of 96%.  Patient continues to require on 10/15 23.  Mild hyponatremia  Sodium 134 on 10/13, continue to monitor  LOS: 16 days A FACE TO FACE EVALUATION WAS PERFORMED  [name] 10/18/2020, 10:24 AM

## 2020-10-19 LAB — GLUCOSE, CAPILLARY
Glucose-Capillary: 102 mg/dL — ABNORMAL HIGH (ref 70–99)
Glucose-Capillary: 131 mg/dL — ABNORMAL HIGH (ref 70–99)
Glucose-Capillary: 120 mg/dL — ABNORMAL HIGH (ref 70–99)
Glucose-Capillary: 144 mg/dL — ABNORMAL HIGH (ref 70–99)

## 2020-10-19 NOTE — Progress Notes (Signed)
Physical Therapy Session Note  Patient Details  Name: [name] MRN: [id number] Date of Birth: 06-14-1965  Today's Date: 10/19/2020 PT Individual Time: 8466-5993 PT Individual Time Calculation (min): 25 min   Short Term Goals: Week 2:  PT Short Term Goal 1 (Week 2): pt will transfer bed<>WC with LRAD and CGA consistantly PT Short Term Goal 1 - Progress (Week 2): Met PT Short Term Goal 2 (Week 2): pt will transfer sit<>stand with LRAD and mod A PT Short Term Goal 2 - Progress (Week 2): Progressing toward goal PT Short Term Goal 3 (Week 2): pt will perform simulated car transfer with LRAD and min A PT Short Term Goal 3 - Progress (Week 2): Met Week 3:  PT Short Term Goal 1 (Week 3): STG=LTG due to LOS  Skilled Therapeutic Interventions/Progress Updates:   Received pt semi-reclined in bed in preparation to eat breakfast. Pt frustrated at timing of therapist's entry reporting not getting the chance to eat breakfast yet. Therapist offered to assist pt into WC to eat breakfast for better positioning to improve digestion, but pt refused. Pt did report urge to void and therapist suggested getting onto bedside commode to void; pt agreed. Pt transferred semi-reclined<>sitting EOB mod I and donned L shoe with max A for time management purposes. Pt transferred to/from bedside commode with slideboard and light min A due to difficulty scooting across slideboard in gown. Pt performed tricep pushups x 2 for therapist to manage brief and able to void and perform peri-care with supervision. Sit<>semi-reclined mod I. Pt then became agitated due to not being able to practice standing. Explained time restrictions of a 30 minute session and importance of using time efficiently, but reassured pt that standing tolerance will continue to be a part of therapy sessions. Concluded session with pt semi-reclined in bed, needs within reach, and bed alarm on. Pt on 1L O2 via [place] throughout session, did not check SPO2 but  pt did not complain of any signs/symptoms of SOB.   Therapy Documentation Precautions:  Precautions Precautions: Fall Precaution Comments: R BKA prosthesis not able to don 2/2 edema. Restrictions Weight Bearing Restrictions: No  Therapy/Group: Individual Therapy Alfonse Alpers PT, DPT   10/19/2020, 7:29 AM

## 2020-10-20 LAB — CBC
HCT: 33.2 % — ABNORMAL LOW (ref 36.0–46.0)
Hemoglobin: 9.6 g/dL — ABNORMAL LOW (ref 12.0–15.0)
MCH: 25.5 pg — ABNORMAL LOW (ref 26.0–34.0)
MCHC: 28.9 g/dL — ABNORMAL LOW (ref 30.0–36.0)
MCV: 88.3 fL (ref 80.0–100.0)
Platelets: 333 10*3/uL (ref 150–400)
RBC: 3.76 MIL/uL — ABNORMAL LOW (ref 3.87–5.11)
RDW: 22.4 % — ABNORMAL HIGH (ref 11.5–15.5)
WBC: 8.1 10*3/uL (ref 4.0–10.5)
nRBC: 0 % (ref 0.0–0.2)

## 2020-10-20 LAB — BASIC METABOLIC PANEL
Anion gap: 8 (ref 5–15)
BUN: 26 mg/dL — ABNORMAL HIGH (ref 6–20)
CO2: 25 mmol/L (ref 22–32)
Calcium: 9 mg/dL (ref 8.9–10.3)
Chloride: 104 mmol/L (ref 98–111)
Creatinine, Ser: 1.28 mg/dL — ABNORMAL HIGH (ref 0.44–1.00)
GFR, Estimated: 50 mL/min — ABNORMAL LOW (ref >60)
Glucose, Bld: 106 mg/dL — ABNORMAL HIGH (ref 70–99)
Potassium: 4.2 mmol/L (ref 3.5–5.1)
Sodium: 137 mmol/L (ref 135–145)

## 2020-10-20 LAB — GLUCOSE, CAPILLARY
Glucose-Capillary: 102 mg/dL — ABNORMAL HIGH (ref 70–99)
Glucose-Capillary: 122 mg/dL — ABNORMAL HIGH (ref 70–99)
Glucose-Capillary: 124 mg/dL — ABNORMAL HIGH (ref 70–99)
Glucose-Capillary: 134 mg/dL — ABNORMAL HIGH (ref 70–99)

## 2020-10-20 MED ORDER — OXYCODONE HCL 5 MG PO TABS: 2.5000 mg | ORAL_TABLET | Freq: Every day | ORAL | Status: DC | PRN

## 2020-10-20 MED ORDER — SALINE SPRAY 0.65 % NA SOLN
1.0000 | Freq: Three times a day (TID) | NASAL | Status: DC
  Administered 2020-10-20 – 2020-10-23 (×11): 1 via NASAL

## 2020-10-20 NOTE — Progress Notes (Signed)
Occupational Therapy Session Note  Patient Details  Name: [name] MRN: [id number] Date of Birth: 1965-09-04  Today's Date: 10/20/2020 OT Individual Time: 4098-1191 OT Individual Time Calculation (min): 67 min    Short Term Goals: Week 2:  OT Short Term Goal 1 (Week 2): Pt will bathe lower body with mod A OT Short Term Goal 1 - Progress (Week 2): Met OT Short Term Goal 2 (Week 2): Pt will dress lower body with mod A OT Short Term Goal 2 - Progress (Week 2): Met OT Short Term Goal 3 (Week 2): Pt will complete lateral weight shifting with min A in preparation for LB dressing OT Short Term Goal 3 - Progress (Week 2): Progressing toward goal OT Short Term Goal 4 (Week 2): Pt will complete toileting tasks with Max A OT Short Term Goal 4 - Progress (Week 2): Met  Skilled Therapeutic Interventions/Progress Updates:  Skilled OT intervention completed with focus on meal prep tasks and toilet transfers. Pt received seated in w/c and agreeable to therapy on RA. Discussed rehab goals with OT and their purpose, as pt prepares for d/c, as well as home kitchen environment to address meal prep goal. Pt self-propelled w/c <> room to ADL kitchen to engage in simulated meal prep tasks at pt's baseline at w/c level. Pt demonstrated ability to retrieve kitchen items, place a baking sheet in oven/simulate cooking on stovetop. Educated pt on safety precautions when navigating putting items in the oven/utilizing the stove (however reports rarely using stove top). Pt advised to put most used items at reachable level from w/c to prevent the need to stand. Pt denied the need to practice changing bed sheets once home saying she plans to make her son complete when needed. Completed SB transfer <> BSC with close supervision/stabilization of w/c for safety, with pt able to push up from [facility] for therapist to doff pants/brief. Pt able to void, then complete perineal care with set up. Completed push up for therapist to  donn brief as well as scooping pants under bottom and over hips. Pt left seated in w/c, with alarm belt activated/donned, and all needs in reach at end of session.  Therapy Documentation Precautions:  Precautions Precautions: Fall Precaution Comments: R BKA prosthesis not able to don 2/2 edema. Restrictions Weight Bearing Restrictions: No  Pain: Pt with no c/o pain during session.   Therapy/Group: Individual Therapy  Runell Kovich E Amarius Toto 10/20/2020, 7:47 AM

## 2020-10-20 NOTE — Progress Notes (Signed)
Occupational Therapy Session Note  Patient Details  Name: [name] MRN: [id number] Date of Birth: [birth year]  Today's Date: 10/20/2020 OT Individual Time: 0903-1000 OT Individual Time Calculation (min): 57 min    Short Term Goals: Week 2:  OT Short Term Goal 1 (Week 2): Pt will bathe lower body with mod A OT Short Term Goal 1 - Progress (Week 2): Met OT Short Term Goal 2 (Week 2): Pt will dress lower body with mod A OT Short Term Goal 2 - Progress (Week 2): Met OT Short Term Goal 3 (Week 2): Pt will complete lateral weight shifting with min A in preparation for LB dressing OT Short Term Goal 3 - Progress (Week 2): Progressing toward goal OT Short Term Goal 4 (Week 2): Pt will complete toileting tasks with Max A OT Short Term Goal 4 - Progress (Week 2): Met  Skilled Therapeutic Interventions/Progress Updates:  Skilled OT intervention completed with focus on ADL retraining and functional transfer. Pt received with NT in room finishing toileting at bed pan level and donning brief. Pt very frustrated and short-tempered with therapist upon arrival with complaints that she doesn't need therapy and would prefer nurse complete all tasks for her. Pt educated about importance of completing self-care tasks as independently as possible, for d/c readiness as well as per pt's expectations of how she will complete tasks at home vs having people complete them for her. Bed mobility mod I, then seated EOB pt completed UB/LB bathing tasks with set up, excluding perineal area. Pt with request to have a BM, slideboard transferred EOB > BSC with CGA. Pt refused to assist doffing brief, with therapist doffing at total assist. Pt set up with wash cloths/hygiene spray and was able to complete cleaning of buttocks/perineal with supervision. Pt used BUE to push up on BSC to lift bottom for therapist to donn brief. Pt refusing to assist in attaching brief. Pt needs extra time to complete ADL tasks due to decreased  motor planning, as well as time needed for therapist to explain reasoning behind certain tasks as pt has recently been very irritable with suggestions provided throughout session to make tasks easier. NT and PT in room with pt seated on BSC during care transition.  Therapy Documentation Precautions:  Precautions Precautions: Fall Precaution Comments: R BKA prosthesis not able to don 2/2 edema. Restrictions Weight Bearing Restrictions: No  Pain: Pt with no c/o pain this session, however with c/o of diarrhea and nurse made aware with pt request of meds for anti diarrhea.   Therapy/Group: Individual Therapy  Jamel Holzmann E Calie Buttrey 10/20/2020, 7:46 AM

## 2020-10-20 NOTE — Progress Notes (Signed)
Physical Therapy Session Note  Patient Details  Name: [name] MRN: [id number] Date of Birth: Jan 06, 1965  Today's Date: 10/20/2020 PT Individual Time: 1000-1054 PT Individual Time Calculation (min): 54 min   Short Term Goals: Week 2:  PT Short Term Goal 1 (Week 2): pt will transfer bed<>WC with LRAD and CGA consistantly PT Short Term Goal 1 - Progress (Week 2): Met PT Short Term Goal 2 (Week 2): pt will transfer sit<>stand with LRAD and mod A PT Short Term Goal 2 - Progress (Week 2): Progressing toward goal PT Short Term Goal 3 (Week 2): pt will perform simulated car transfer with LRAD and min A PT Short Term Goal 3 - Progress (Week 2): Met Week 3:  PT Short Term Goal 1 (Week 3): STG=LTG due to LOS  Skilled Therapeutic Interventions/Progress Updates:   Received pt sitting on commode with OT; handoff to PT. Pt agreeable to PT treatment and denied any pain during session. Donned pants sitting on commode with supervision and performed tricep pushup and therapist pulled pants over hips with total A - pt very particular about getting pants lining exactly in the middle. Session with emphasis on functional mobility/transfers, generalized strengthening, dynamic standing balance/coordination, and improved activity tolerance. Pt transferred bedside commode<>bed<>WC via slideboard with CGA and total A to place board for time management. Pt combed and managed hair independently. O2 sat 91% on 1L O2. Pt transported to/from room in [facility] total A for time management purposes. Pt performed squat<>pivot WC<>mat to R with min A but demonstrated anterior LOB requiring blocking from therapist. Pt required multiple attempts to transfer sit<>stand x 5 trials from 16in mat with mod/heavy mod A fading to max A with both UE's placed on RW. While standing pt performed the following exercises with BUE support and CGA for balance: -R hip flexion 2x10 -LLE heel raises 2x8  -R hip abduction 2x10 Transitioned to  dynamic standing balance tossing beanbag using LUE and min A for balance x 1 trial, compensating by relying on mat for support at posterior L knee - pt with 1 LOB requiring min A to correct. Concluded session with pt sitting in WC, needs within reach, and seatbelt alarm on. RN present at bedside administering medications.   Therapy Documentation Precautions:  Precautions Precautions: Fall Precaution Comments: R BKA prosthesis not able to don 2/2 edema. Restrictions Weight Bearing Restrictions: No  Therapy/Group: Individual Therapy Alfonse Alpers PT, DPT   10/20/2020, 7:27 AM

## 2020-10-20 NOTE — Progress Notes (Addendum)
Patient ID: [name], female   DOB: 1965/12/18, 55 y.o.   MRN: [id number] Met with pt to discuss she is in collections with [employer] and she can not get more equipment from them until she pays this. She feels she does not woe them. Have asked them to contact her regarding this. She did not realize she had O2 at home and maybe this is the co-pays she owes them. She was not using this but feels needs to see if needs this time and then may call to return. Encouraged her to discuss with MD regarding this. Pt feels she is doing better and can stand turn for her transfers since she is not wanting a sliding board at discharge. Will continue to monitor her progress this week since discharge is set for Sat 10/22.  1:10 Pm Pt is requesting to discharge home on Friday instead of Sat due to transportation. Team and MD on board will change discharge to Friday.

## 2020-10-20 NOTE — Progress Notes (Signed)
PROGRESS NOTE   Subjective/Complaints: She has no new complaints this morning Had a good weekend and enjoyed visit from her son Discussed her labs and that creatinine is stable- will repeat again later this week.   ROS: Denies CP, SOB, N/V/D, +lower extremity swelling  Objective:   No results found. Recent Labs    10/20/20 0535  WBC 8.1  HGB 9.6*  HCT 33.2*  PLT 333     Recent Labs    10/20/20 0535  NA 137  K 4.2  CL 104  CO2 25  GLUCOSE 106*  BUN 26*  CREATININE 1.28*  CALCIUM 9.0     Intake/Output Summary (Last 24 hours) at 10/20/2020 1159 Last data filed at 10/20/2020 0914 Gross per 24 hour  Intake 600 ml  Output --  Net 600 ml        Physical Exam: Vital Signs Blood pressure (!) 157/73, pulse 71, temperature 98.7 F (37.1 C), temperature source Oral, resp. rate 18, height 5\' 7"  (1.702 m), weight 103.9 kg, SpO2 94 %. Constitutional: No distress . Vital signs reviewed. HENT: Normocephalic.  Atraumatic. Eyes: EOMI. No discharge. Cardiovascular: No JVD.  RRR. Respiratory: Normal effort.  No stridor.  Bilateral clear to auscultation.  + [place]. GI: Non-distended.  BS +. Skin: Warm and dry.  Intact. Psych: Normal mood.  Normal behavior. Musc: +bilateral lower extremity edema. No tenderness in extremities. Neuro: Alert Motor: Grossly 4/5 throughout  Assessment/Plan: 1. Functional deficits which require 3+ hours per day of interdisciplinary therapy in a comprehensive inpatient rehab setting. Physiatrist is providing close team supervision and 24 hour management of active medical problems listed below. Physiatrist and rehab team continue to assess barriers to discharge/monitor patient progress toward functional and medical goals  Care Tool:  Bathing    Body parts bathed by patient: Right arm, Left arm, Chest, Abdomen, Front perineal area, Right upper leg, Left upper leg, Buttocks, Face   Body  parts bathed by helper: Buttocks Body parts n/a: Right lower leg   Bathing assist Assist Level: Contact Guard/Touching assist     Upper Body Dressing/Undressing Upper body dressing   What is the patient wearing?: Pull over shirt, Bra    Upper body assist Assist Level: Set up assist    Lower Body Dressing/Undressing Lower body dressing      What is the patient wearing?: Underwear/pull up, Pants     Lower body assist Assist for lower body dressing: Moderate Assistance - Patient 50 - 74% Assistive Device Comment: threaded pants at EOB with supervision, rolling R<>L needed for donning past waist   Toileting Toileting    Toileting assist Assist for toileting: Moderate Assistance - Patient 50 - 74%     Transfers Chair/bed transfer  Transfers assist     Chair/bed transfer assist level: Minimal Assistance - Patient > 75% (squat<>pivot)     Locomotion Ambulation   Ambulation assist   Ambulation activity did not occur: Safety/medical concerns          Walk 10 feet activity   Assist  Walk 10 feet activity did not occur: Safety/medical concerns        Walk 50 feet activity   Assist Walk  50 feet with 2 turns activity did not occur: Safety/medical concerns         Walk 150 feet activity   Assist Walk 150 feet activity did not occur: Safety/medical concerns         Walk 10 feet on uneven surface  activity   Assist Walk 10 feet on uneven surfaces activity did not occur: Safety/medical concerns         Wheelchair     Assist Is the patient using a wheelchair?: Yes Type of Wheelchair: Manual    Wheelchair assist level: Supervision/Verbal cueing Max wheelchair distance: 150    Wheelchair 50 feet with 2 turns activity    Assist        Assist Level: Supervision/Verbal cueing   Wheelchair 150 feet activity     Assist      Assist Level: Supervision/Verbal cueing   Blood pressure (!) 157/73, pulse 71, temperature 98.7 F  (37.1 C), temperature source Oral, resp. rate 18, height 5\' 7"  (1.702 m), weight 103.9 kg, SpO2 94 %.  Medical Problem List and Plan: 1.  Debility secondary to ITP/s/p splenectomy and old R BKA- hasn't worn her new prosthesis due to fitting issues  Continue CIR 2.  Impaired mobility: continue Lovenox             -antiplatelet therapy: N/A 3. Intermittent RLE pain: Decrease Oxycodone to 2.5mg  daily prn. Provided with a pain relief journal  Controlled with meds on 10/154. Mood: LCSW to follow for evaluation and support.              -antipsychotic agents: N/A 5. Neuropsych: This patient is capable of making decisions on her own behalf. 6. Skin/Wound Care: Monitor wound for healing.   7. Fluids/Electrolytes/Nutrition: Monitor I/Os.   Albumin remains low but slightly higher than prior now up to 2.5 8. ITP/pancytopenia/anemia:Improving hold off on aspirin.   Hemoglobin 9.2 on 10/10, labs ordered for tomorrow 9. MRSE bacteremia/HAP: IV antibiotics transitioned to Augmentin thru 10/20/20 10. Left pleural effusion s/p thoracotomy 9/19: To follow up with pulmonary after discharge.                   --encourage pulmonary hygiene with flutter valve.  11. Acute on chronic CHF: Decrease Lasix to 40mg  BID.               --monitor I/O. Monitor for signs of overload.             --continue Toprol XL, Lasix  Filed Weights   10/18/20 0506 10/19/20 0423 10/20/20 0418  Weight: 104.1 kg 105.2 kg 103.9 kg   12. HTN: Monitor BP TID--continue Toprol, Lasix, Imdur. Increase Lasix to 50mg  BID  Increased imdur to 45mg  starting 10/9 Vitals:   10/20/20 0347 10/20/20 0810  BP: (!) 144/74 (!) 157/73  Pulse: 71   Resp: 18 18  Temp: 98.4 F (36.9 C) 98.7 F (37.1 C)  SpO2: 94% 94%   Norvasc 2.5 started on 10/15 13. T2DM: Hgb A1C- 5.5 and well controlled on metformin BID PTA             --on hold due to AKI. Will continue to monitor BS ac/hs and use SSI for now  CBG (last 3)  Recent Labs    10/19/20 2123  10/20/20 0601 10/20/20 1123  GLUCAP 144* 102* 122*    Controlled on 10/15 14. Acute on chronic CKD III:  Lasix has been uptitrated to 50mg  BID given painful swelling at times Creatinine 1.28, repeat  Wednesday 15. Thyroid nodules: Follow up with [facility] after discharge.  16.  Cecal wall thickening: Will need to contact GI when nearing discharge to determine timing of colonoscopy.  17. Severe obesity BMI 36.95: provide dietary education,  18.  Thrombocytosis will monitor platelets, expect this to remain elevated status post splenectomy  Platelets 46 on 10/10, labs ordered for Monday 19. Urinary retention: UC+ for >100,000 colonies yeast- switched macrobid to diflucan 20. Left sided low back pain: continue kpad. 21. Leukocytosis: resolved, monitor weekly 22. Hypoxia: continue oxygen via nasal cannula with goal of 96%.  Patient continues to require on 10/15 23.  Mild hyponatremia  Sodium 134 on 10/13, continue to monitor  LOS: 18 days A FACE TO FACE EVALUATION WAS PERFORMED  Estiben Mizuno P Jadien Lehigh 10/20/2020, 11:59 AM

## 2020-10-21 LAB — GLUCOSE, CAPILLARY
Glucose-Capillary: 118 mg/dL — ABNORMAL HIGH (ref 70–99)
Glucose-Capillary: 130 mg/dL — ABNORMAL HIGH (ref 70–99)
Glucose-Capillary: 144 mg/dL — ABNORMAL HIGH (ref 70–99)
Glucose-Capillary: 143 mg/dL — ABNORMAL HIGH (ref 70–99)

## 2020-10-21 MED ORDER — FUROSEMIDE 40 MG PO TABS
40.0000 mg | ORAL_TABLET | Freq: Two times a day (BID) | ORAL | Status: DC
  Administered 2020-10-21 – 2020-10-23 (×3): 40 mg via ORAL
  Filled 2020-10-21 (×5): qty 1

## 2020-10-21 MED ORDER — CLONIDINE HCL 0.1 MG/24HR TD PTWK
0.1000 mg | MEDICATED_PATCH | TRANSDERMAL | Status: DC
  Administered 2020-10-21: 0.1 mg via TRANSDERMAL
  Filled 2020-10-21: qty 1

## 2020-10-21 MED ORDER — LIDOCAINE 5 % EX PTCH
1.0000 | MEDICATED_PATCH | Freq: Every day | CUTANEOUS | Status: DC
  Administered 2020-10-21 – 2020-10-23 (×3): 1 via TRANSDERMAL
  Filled 2020-10-21 (×3): qty 1

## 2020-10-21 MED ORDER — TRAMADOL HCL 50 MG PO TABS
50.0000 mg | ORAL_TABLET | Freq: Four times a day (QID) | ORAL | Status: DC | PRN
Start: 2020-10-21 — End: 2020-10-24
  Administered 2020-10-21: 50 mg via ORAL
  Filled 2020-10-21: qty 1

## 2020-10-21 NOTE — Patient Care Conference (Signed)
Inpatient RehabilitationTeam Conference and Plan of Care Update Date: 10/21/2020   Time: 1:38 PM    Patient Name: [name]      Medical Record Number: [id number]  Date of Birth: 14-Dec-1965 Sex: Female         Room/Bed: 4W24C/4W24C-01 Payor Info: Payor: [employer] MEDICARE / Plan: BCBS MEDICARE / Product Type: *No Product type* /    Admit Date/Time:  10/02/2020  2:35 PM  Primary Diagnosis:  Debility  Hospital Problems: Principal Problem:   Debility Active Problems:   Hyponatremia   Thrombocytosis    Expected Discharge Date: Expected Discharge Date: 10/24/20  Team Members Present: Physician leading conference: Dr. Sula Soda Social Worker Present: Dossie Der, LCSW Nurse Present: Kennyth Arnold, RN PT Present: Wynelle Link, PT OT Present: Dolphus Jenny, OT PPS Coordinator present : Fae Pippin, SLP     Current Status/Progress Goal Weekly Team Focus  Bowel/Bladder   Pt continent with episodes of incontinence of B/B  less incontinent episodes  Assess skin every shift and PRN   Swallow/Nutrition/ Hydration             ADL's   UB bath/dress set up EOB, LB bath/dress min A EOB, toilet transfer > BSC using SB and CGA-supervision, toileting min A  Mod assist overall, min assist bathing  Squat pivot/stand pivot transfers to [facility], BUE/core strengthening, safety awareness in home, ADL retraining   Mobility   bed mobility mod I, slideboard transfers CGA/supervision, sit<>stands from elevated surfaces with RW heavy mod/max A, WC mobility 128ft supervision  min A sit<>stand, supervision SB transfers, CGA car transfer, mod I WC mobility  functional mobility/transfers, generalized strengthening, dynamic standing balance/coordination, amputee education, endurance, motor planning/sequencing, and D/C planning. - would benefit from family education   Communication             Safety/Cognition/ Behavioral Observations            Pain   Pt rating pain  currently a 0/10  continued pain rating 0/10  Assess pain every shift and PRN   Skin   skin intact  no new skin breakdown  assess skin every shift and PRN     Discharge Planning:  Pt going home on Friday due to her preference and transportation. Aware she is not mod/i and feels can mange at home somewhat unrealistic regarding her levels. Has all DME and HH active with [facility]. has home O2 she was not using but will see if needs once at home. She does not worker to call her Mom and will respect her wishes   Team Discussion: Loose stools have resolved. Discussed dietary modifications. Weaning Oxy IR. Continent B/B, LBM 10/17. Reports 7/10 pain. Abdominal incision looks good. No equipment provided until patient pays [employer]. Family training and education needed. Will not meet Mod I goals. Patient on target to meet rehab goals: Will not meet Mod I goals.  *See Care Plan and progress notes for long and short-term goals.   Revisions to Treatment Plan:  Adjusting and finalizing discharge medications.  Teaching Needs: Family education, medication management, pain management, skin/wound care, safety awareness.  Current Barriers to Discharge: Decreased caregiver support, Home enviroment access/layout, Wound care, Lack of/limited family support, Weight, Weight bearing restrictions, Medication compliance, and Behavior  Possible Resolutions to Barriers: Family education, HH active with [facility]. .     Medical Summary Current Status: loose stools, swelling in bilateral lower extremities, left flank pain, ocasional residual limb pain  Barriers to Discharge: Medical stability;Behavior  Barriers to Discharge Comments: loose stools, swelling in bilateral lower extremities, left flank pain, ocasional residual limb pain Possible Resolutions to [employer]: continue imodium prn, taper Lasix to 40mg  BID, continue to weakn oxycodone, continue heating pad, d/c this  Friday   Continued Need for Acute Rehabilitation Level of Care: The patient requires daily medical management by a physician with specialized training in physical medicine and rehabilitation for the following reasons: Direction of a multidisciplinary physical rehabilitation program to maximize functional independence : Yes Medical management of patient stability for increased activity during participation in an intensive rehabilitation regime.: Yes Analysis of laboratory values and/or radiology reports with any subsequent need for medication adjustment and/or medical intervention. : Yes   I attest that I was present, lead the team conference, and concur with the assessment and plan of the team.   Saturday 10/21/2020, 5:10 PM

## 2020-10-21 NOTE — Discharge Instructions (Addendum)
Inpatient Rehab Discharge Instructions  [name] Discharge date and time: 10/24/20   Activities/Precautions/ Functional Status: Activity: no lifting, driving, or strenuous exercise till cleared by MD Diet: regular diet Wound Care: keep wound clean and dry   Functional status:  ___ No restrictions     ___ Walk up steps independently _X__ 24/7 supervision/assistance   ___ Walk up steps with assistance ___ Intermittent supervision/assistance  ___ Bathe/dress independently ___ Walk with walker     __X_ Bathe/dress with assistance ___ Walk Independently    ___ Shower independently _X__ Walk with assistance    ___ Shower with assistance ___ No alcohol     ___ Return to work/school ________  Special Instructions:  COMMUNITY REFERRALS UPON DISCHARGE:    Home Health:   PT OT RN AIDE SW                 Agency:[facility] [phone]  Medical Equipment/Items Ordered: HAS ALL NEEDED EQUIPMENT-NEEDS TO CONTACT [employer] TO HAVE FIX HER LEFT BRAKE ON WHEELCHAIR              My questions have been answered and I understand these instructions. I will adhere to these goals and the provided educational materials after my discharge from the hospital.  Patient/Caregiver Signature _______________________________ Date __________  Clinician Signature _______________________________________ Date __________  Please bring this form and your medication list with you to all your follow-up doctor's appointments.

## 2020-10-21 NOTE — Progress Notes (Signed)
Physical Therapy Session Note  Patient Details  Name: [name] MRN: [id number] Date of Birth: 02-02-1965  Today's Date: 10/21/2020 PT Individual Time: 3833-3832 PT Individual Time Calculation (min): 42 min   Short Term Goals: Week 1:  PT Short Term Goal 1 (Week 1): Pt will transfer sup <> sit w/ mod I. PT Short Term Goal 1 - Progress (Week 1): Met PT Short Term Goal 2 (Week 1): Pt will transfer bed <> w/c w/ max A using slide board. PT Short Term Goal 2 - Progress (Week 1): Met PT Short Term Goal 3 (Week 1): Pt will transfer sit to stand w/ max A PT Short Term Goal 3 - Progress (Week 1): Met Week 2:  PT Short Term Goal 1 (Week 2): pt will transfer bed<>WC with LRAD and CGA consistantly PT Short Term Goal 1 - Progress (Week 2): Met PT Short Term Goal 2 (Week 2): pt will transfer sit<>stand with LRAD and mod A PT Short Term Goal 2 - Progress (Week 2): Progressing toward goal PT Short Term Goal 3 (Week 2): pt will perform simulated car transfer with LRAD and min A PT Short Term Goal 3 - Progress (Week 2): Met Week 3:  PT Short Term Goal 1 (Week 3): STG=LTG due to LOS  Skilled Therapeutic Interventions/Progress Updates:  Pt received seated in WC in room, very rude to therapist upon entry to room and stated "she would do what she wants" for session. Pt reported needing to void, set up [facility] for pt who continuously berated therapist for "doing it poorly". Encouraged pt to use sliding board for safe transfer, pt replied "she would not be using it at home despite what the 'little girl' says" and "she thought therapists were supposed to be smart". Educated pt on increased safety w/sliding board and pt unable to perform stand pivots by herself, pt replied that therapist "knew nothing and should stop talking". Pt performed sliding board transfer to [facility] on L side w/min A, pt unable to set up transfer by herself and required verbal cues for sequencing. Pt attempted to stand from [facility] without AD to  doff pants but unable to stand. Pt able to push hips from [facility] but unable to doff pants by herself, despite her telling therapist that is how she will get them off at home. Educated pt on lateral weight shift while seated to doff pants, pt very rude and continued to talk down to therapist but was able to doff pants w/S* while seated on BSC using aforementioned method and seemingly toss brief towards therapist's face. Attempted to address pt's attitude towards therapist, inquiring as to what therapist could do to make session better, which pt replied "leave". After several minutes attempting to void on BSC, pt was able to void continently and performed peri care w/set-up assist. Pt refused to try to donn pants by herself and told therapist she should "do her job for once" and do it for pt. To reduce further conflict, therapist obliged and doffed pants w/total A. Pt performed lateral sliding board transfer to [facility] from [facility] on R side w/min A. Pt donned 1L O2 and self-propelled >200' w/S* around unit for improved cardiovascular endurance and functional mobility. Pt returned to room, aggressively removed her O2 and was left seated in WC in room, all needs in reach. Primary PT notified of session.   Therapy Documentation Precautions:  Precautions Precautions: Fall Precaution Comments: R BKA prosthesis not able to don 2/2 edema. Restrictions Weight Bearing Restrictions:  No   Therapy/Group: Individual Therapy Cruzita Lederer Fareeha Evon, PT, DPT  10/21/2020, 7:52 AM

## 2020-10-21 NOTE — Progress Notes (Signed)
PROGRESS NOTE   Subjective/Complaints: [name] is feeling ready for d/c Friday She has had no more loose stools since yesterday when she received imodium Discussed 30 day supply of medications will be given upon discharge.   ROS: denies CP, SOB, N/V/D, +lower extremity swelling  Objective:   No results found. Recent Labs    10/20/20 0535  WBC 8.1  HGB 9.6*  HCT 33.2*  PLT 333     Recent Labs    10/20/20 0535  NA 137  K 4.2  CL 104  CO2 25  GLUCOSE 106*  BUN 26*  CREATININE 1.28*  CALCIUM 9.0     Intake/Output Summary (Last 24 hours) at 10/21/2020 1000 Last data filed at 10/20/2020 1424 Gross per 24 hour  Intake 240 ml  Output --  Net 240 ml        Physical Exam: Vital Signs Blood pressure (!) 146/63, pulse 71, temperature 98.1 F (36.7 C), resp. rate 18, height 5\' 7"  (1.702 m), weight 103.7 kg, SpO2 96 %. Gen: no distress, normal appearing HEENT: oral mucosa pink and moist, NCAT Cardio: Reg rate  Respiratory: Normal effort.  No stridor.  Bilateral clear to auscultation.  + [place]. GI: Non-distended.  BS +. Skin: Warm and dry.  Intact. Psych: Normal mood.  Normal behavior. Musc: +bilateral lower extremity edema. No tenderness in extremities. Neuro: Alert Motor: Grossly 4/5 throughout  Assessment/Plan: 1. Functional deficits which require 3+ hours per day of interdisciplinary therapy in a comprehensive inpatient rehab setting. Physiatrist is providing close team supervision and 24 hour management of active medical problems listed below. Physiatrist and rehab team continue to assess barriers to discharge/monitor patient progress toward functional and medical goals  Care Tool:  Bathing    Body parts bathed by patient: Right arm, Left arm, Chest, Abdomen, Front perineal area, Right upper leg, Left upper leg, Buttocks, Face   Body parts bathed by helper: Buttocks Body parts n/a: Right lower  leg   Bathing assist Assist Level: Contact Guard/Touching assist     Upper Body Dressing/Undressing Upper body dressing   What is the patient wearing?: Pull over shirt, Bra    Upper body assist Assist Level: Set up assist    Lower Body Dressing/Undressing Lower body dressing      What is the patient wearing?: Underwear/pull up, Pants     Lower body assist Assist for lower body dressing: Moderate Assistance - Patient 50 - 74% Assistive Device Comment: threaded pants at EOB with supervision, rolling R<>L needed for donning past waist   Toileting Toileting    Toileting assist Assist for toileting: Moderate Assistance - Patient 50 - 74%     Transfers Chair/bed transfer  Transfers assist     Chair/bed transfer assist level: Minimal Assistance - Patient > 75% (squat<>pivot)     Locomotion Ambulation   Ambulation assist   Ambulation activity did not occur: Safety/medical concerns          Walk 10 feet activity   Assist  Walk 10 feet activity did not occur: Safety/medical concerns        Walk 50 feet activity   Assist Walk 50 feet with 2 turns activity  did not occur: Safety/medical concerns         Walk 150 feet activity   Assist Walk 150 feet activity did not occur: Safety/medical concerns         Walk 10 feet on uneven surface  activity   Assist Walk 10 feet on uneven surfaces activity did not occur: Safety/medical concerns         Wheelchair     Assist Is the patient using a wheelchair?: Yes Type of Wheelchair: Manual    Wheelchair assist level: Supervision/Verbal cueing Max wheelchair distance: 150    Wheelchair 50 feet with 2 turns activity    Assist        Assist Level: Supervision/Verbal cueing   Wheelchair 150 feet activity     Assist      Assist Level: Supervision/Verbal cueing   Blood pressure (!) 146/63, pulse 71, temperature 98.1 F (36.7 C), resp. rate 18, height 5\' 7"  (1.702 m), weight  103.7 kg, SpO2 96 %.  Medical Problem List and Plan: 1.  Debility secondary to ITP/s/p splenectomy and old R BKA- hasn't worn her new prosthesis due to fitting issues  Continue CIR 2.  Impaired mobility: continue Lovenox             -antiplatelet therapy: N/A 3. Intermittent RLE pain: d/c oxycodone. Provided with a pain relief journal  Controlled with meds on 10/154. Mood: LCSW to follow for evaluation and support.              -antipsychotic agents: N/A 5. Neuropsych: This patient is capable of making decisions on her own behalf. 6. Skin/Wound Care: Monitor wound for healing.   7. Fluids/Electrolytes/Nutrition: Monitor I/Os.   Albumin remains low but slightly higher than prior now up to 2.5 8. ITP/pancytopenia/anemia:Improving hold off on aspirin.   Hemoglobin 9.2 on 10/10, labs ordered for tomorrow 9. MRSE bacteremia/HAP: IV antibiotics transitioned to Augmentin thru 10/20/20 10. Left pleural effusion s/p thoracotomy 9/19: To follow up with pulmonary after discharge.                   --encourage pulmonary hygiene with flutter valve.  11. Acute on chronic CHF: Decrease Lasix to 40mg  BID.               --monitor I/O. Monitor for signs of overload.             --continue Toprol XL, Lasix  Filed Weights   10/19/20 0423 10/20/20 0418 10/21/20 0541  Weight: 105.2 kg 103.9 kg 103.7 kg   12. HTN: Monitor BP TID--continue Toprol, Lasix, Imdur. Increase Lasix to 50mg  BID  Increased imdur to 45mg  starting 10/9  D/c amlodipine given leg swelling. Add clonodine patch for BP and pain.  Vitals:   10/20/20 2037 10/21/20 0541  BP: (!) 132/57 (!) 146/63  Pulse: 73 71  Resp: 20 18  Temp: 98.9 F (37.2 C) 98.1 F (36.7 C)  SpO2: 96% 96%   Norvasc 2.5 started on 10/15 13. T2DM: Hgb A1C- 5.5 and well controlled on metformin BID PTA             --on hold due to AKI. Will continue to monitor BS ac/hs and use SSI for now  CBG (last 3)  Recent Labs    10/20/20 1620 10/20/20 2115 10/21/20 0640   GLUCAP 124* 134* 118*    Controlled on 10/15 14. Acute on chronic CKD III:  Lasix has been uptitrated to 50mg  BID given painful swelling at times Creatinine 1.28,  repeat Wednesday 15. Thyroid nodules: Follow up with [facility] after discharge.  16.  Cecal wall thickening: Will need to contact GI when nearing discharge to determine timing of colonoscopy.  17. Severe obesity BMI 36.95: provide dietary education,  18.  Thrombocytosis will monitor platelets, expect this to remain elevated status post splenectomy  Platelets 46 on 10/10, labs ordered for Monday 19. Urinary retention: UC+ for >100,000 colonies yeast- switched macrobid to diflucan 20. Left sided low back pain: continue kpad. 21. Leukocytosis: resolved, monitor weekly 22. Hypoxia: continue oxygen via nasal cannula with goal of 96%.  Patient continues to require on 10/15 23.  Mild hyponatremia  Sodium 134 on 10/13, continue to monitor  LOS: 19 days A FACE TO FACE EVALUATION WAS PERFORMED  [name] P Giulliana Mcroberts 10/21/2020, 10:00 AM

## 2020-10-21 NOTE — Progress Notes (Signed)
Occupational Therapy Session Note  Patient Details  Name: [name] MRN: [id number] Date of Birth: 25-Mar-1965  Today's Date: 10/21/2020 OT Individual Time: 3406-8403 OT Individual Time Calculation (min): 55 min    Short Term Goals: Week 2:  OT Short Term Goal 1 (Week 2): Pt will bathe lower body with mod A OT Short Term Goal 1 - Progress (Week 2): Met OT Short Term Goal 2 (Week 2): Pt will dress lower body with mod A OT Short Term Goal 2 - Progress (Week 2): Met OT Short Term Goal 3 (Week 2): Pt will complete lateral weight shifting with min A in preparation for LB dressing OT Short Term Goal 3 - Progress (Week 2): Progressing toward goal OT Short Term Goal 4 (Week 2): Pt will complete toileting tasks with Max A OT Short Term Goal 4 - Progress (Week 2): Met  Skilled Therapeutic Interventions/Progress Updates:  Skilled OT intervention completed with focus on ADL retraining and functional transfers. Pt received seated in long sitting in bed, agreeable to therapy. Pt opted to not bathe this session, however was able to complete all dressing tasks while seated at EOB with set up assist including pants using lateral L<>R weight shifts. Pt required extra time to complete ADLs, as pt became easily distractible this session, asking multiple questions about her need for equipment with request for therapist to contact CSW to confirm what she is able to receive and why she is not eligible for others. Recommended pt to complete dressing tasks in similar manner at home at EOB vs w/c with pt's specific home set up due to safety, however pt mentioned she doesn't want therapist's recommendations and will complete things as she wishes at home like she did PTA. Pt able to donn shoe with set up. RN in room to administer meds. Encouraged pt to complete squat pivot transfer vs SB, with pt actually completing stand pivot transfer from EOB > w/c with CGA, demonstrating progress and initiation with transfers,  however with poor safety awareness. Educated pt on the need to use RW if completing standing/pivots due to balance. Pt left seated in w/c, set up for breakfast, with alarm belt on and all needs in reach at end of session.  Therapy Documentation Precautions:  Precautions Precautions: Fall Precaution Comments: R BKA prosthesis not able to don 2/2 edema. Restrictions Weight Bearing Restrictions: No  Pain: Pt with no c/o pain during session.   Therapy/Group: Individual Therapy  Addley Ballinger E Kimothy Kishimoto 10/21/2020, 7:45 AM

## 2020-10-21 NOTE — Progress Notes (Signed)
Patient ID: [name], female   DOB: 18-Jun-1965, 55 y.o.   MRN: [id number]  Met with pt to give her [employer] number to call regarding bill she owes them they will not provide any equipment until this is paid. Have reached out to other equipment companies and can not find a wheelchair for her and she would need to pay the co-pays again and she does not want to do this. Discussed the concerns the team has regarding her not being mod/I level and children in school during the day. Pt states: " I will be fine there I can manage." Pt is at high risk to be readmitted back to the hospital. Will continue to work on discharge she is happy it is Friday. Have asked PT to see if they can fix her left brake on her wheelchair while here, she owns the chair and will need to pay to have it fixed when home.

## 2020-10-21 NOTE — Progress Notes (Signed)
Occupational Therapy Session Note  Patient Details  Name: [name] MRN: [id number] Date of Birth: 03-28-1965  Today's Date: 10/21/2020 OT Individual Time: 1502-1530 OT Individual Time Calculation (min): 28 min    Short Term Goals: Week 2:  OT Short Term Goal 1 (Week 2): Pt will bathe lower body with mod A OT Short Term Goal 1 - Progress (Week 2): Met OT Short Term Goal 2 (Week 2): Pt will dress lower body with mod A OT Short Term Goal 2 - Progress (Week 2): Met OT Short Term Goal 3 (Week 2): Pt will complete lateral weight shifting with min A in preparation for LB dressing OT Short Term Goal 3 - Progress (Week 2): Progressing toward goal OT Short Term Goal 4 (Week 2): Pt will complete toileting tasks with Max A OT Short Term Goal 4 - Progress (Week 2): Met  Skilled Therapeutic Interventions/Progress Updates:  Skilled OT intervention completed with focus on sit <> stands from w/c. Pt received seated in w/c and agreeable to session. With w/c propped up against wall for safety due to loose break, pt completed 3 sit <> stands using RW, steadily improving the level of assist from min A on first attempt, to [place] on last two attempts. Pt presents very forward lean during the process, however demonstrated safety once up. Pt able to remain standing for about a min per stand. Required rest breaks in between stands due to fatigue. Asked pt if able to remove one hand from RW to simulate wiping/pants management however pt refused saying she was unable due to heavy reliance on both hands. Provided education on completing LB clothing and toileting tasks at the seated level with lateral leans/push ups due to pt's inability to let go of RW. Pt with decreased insight to CLOF, as pt is not able to link the concept of how she will pull pants down at home in standing like she says she will, if she can't remove a hand from level support for balance. Pt strongly encouraged to complete at seated level for  safety, and to decrease the level of burden on family members. Asked pt if family plans to attend education, with pt stating "they don't need to come, and absolutely won't be needing help from them at home anyways." Pt re-advised that HHOT will help during the transition with figuring out the level of assist needed in the home. Pt left seated in w/c with nurse in room to assist pt to [facility] at end of session.  Therapy Documentation Precautions:  Precautions Precautions: Fall Precaution Comments: R BKA prosthesis not able to don 2/2 edema. Restrictions Weight Bearing Restrictions: No  Pain: Pt with no expressed pain during session.   Therapy/Group: Individual Therapy  Chantay Whitelock E [place] 10/21/2020, 7:46 AM

## 2020-10-21 NOTE — Progress Notes (Signed)
Physical Therapy Session Note  Patient Details  Name: [name] MRN: [id number] Date of Birth: 13-Jun-1965  Today's Date: 10/21/2020 PT Individual Time: 1000-1110 PT Individual Time Calculation (min): 70 min   Short Term Goals: Week 2:  PT Short Term Goal 1 (Week 2): pt will transfer bed<>WC with LRAD and CGA consistantly PT Short Term Goal 1 - Progress (Week 2): Met PT Short Term Goal 2 (Week 2): pt will transfer sit<>stand with LRAD and mod A PT Short Term Goal 2 - Progress (Week 2): Progressing toward goal PT Short Term Goal 3 (Week 2): pt will perform simulated car transfer with LRAD and min A PT Short Term Goal 3 - Progress (Week 2): Met Week 3:  PT Short Term Goal 1 (Week 3): STG=LTG due to LOS  Skilled Therapeutic Interventions/Progress Updates:   Received pt sitting in WC without O2 on - sat 86%. Encouraged pt to put on O2 for activity. Pt agreeable to PT treatment and denied any pain during session. Session with emphasis on functional mobility/transfers, toileting, generalized strengthening, dynamic standing balance/coordination, simulated car transfers, and improved activity tolerance. Pt reported urge to have BM and transferred WC<>bedside commode via slideboard with CGA and total A to place board - educated pt on proper positioning/set up for transfers (90 degrees angle). Pt performed tricep pushups x 2 reps and therapist managed clothing dependently. Removed soiled brief and cleaned excess stool off pt's leg. Pt required significantly increased time, but continued with BM (loose and green) and performed hygiene management with supervision. Bedside commode<>WC via slideboard in same manner with CGA. Pt transported to ortho gym in [facility] total A for time management purposes. Pt performed simulated car transfer via squat<>pivot method with min A overall and cues for technique and safety. Pt reports having a "driver" that she's used for years and feels comfortable getting in/out of his  vehicle but unable to describe to therapist what kind of car he has. Pt then performed WC mobility >373f using BUE and supervision back to room. CSW arrived at end of session to discuss equipment for D/C. Concluded session with pt sitting in WC, needs within reach, and seatbelt alarm on. Provided pt with fresh drink.   Therapy Documentation Precautions:  Precautions Precautions: Fall Precaution Comments: R BKA prosthesis not able to don 2/2 edema. Restrictions Weight Bearing Restrictions: No  Therapy/Group: Individual Therapy AAlfonse AlpersPT, DPT   10/21/2020, 7:30 AM

## 2020-10-22 LAB — BASIC METABOLIC PANEL
Anion gap: 8 (ref 5–15)
BUN: 24 mg/dL — ABNORMAL HIGH (ref 6–20)
CO2: 24 mmol/L (ref 22–32)
Calcium: 8.3 mg/dL — ABNORMAL LOW (ref 8.9–10.3)
Chloride: 98 mmol/L (ref 98–111)
Creatinine, Ser: 1.2 mg/dL — ABNORMAL HIGH (ref 0.44–1.00)
GFR, Estimated: 54 mL/min — ABNORMAL LOW (ref >60)
Glucose, Bld: 124 mg/dL — ABNORMAL HIGH (ref 70–99)
Potassium: 3.8 mmol/L (ref 3.5–5.1)
Sodium: 130 mmol/L — ABNORMAL LOW (ref 135–145)

## 2020-10-22 LAB — GLUCOSE, CAPILLARY
Glucose-Capillary: 125 mg/dL — ABNORMAL HIGH (ref 70–99)
Glucose-Capillary: 148 mg/dL — ABNORMAL HIGH (ref 70–99)
Glucose-Capillary: 130 mg/dL — ABNORMAL HIGH (ref 70–99)
Glucose-Capillary: 157 mg/dL — ABNORMAL HIGH (ref 70–99)

## 2020-10-22 NOTE — Progress Notes (Addendum)
Pt had 7/10 pain in back. Requested PRN oxy, but order was discontinued due to her discharge on Friday 10/21. Offered prn tylenol but pt refused and stated she would like me to contact on call to resume prn oxy.  Contacted on call PA Love order for tramadol 50 mg q 6 prn and lidocaine patch placed and administered. No other concerns to report.

## 2020-10-22 NOTE — Progress Notes (Signed)
Occupational Therapy Session Note  Patient Details  Name: [name] MRN: [id number] Date of Birth: 10-15-1965  Today's Date: 10/22/2020 OT Individual Time: 5901-7241 OT Individual Time Calculation (min): 23 min    Short Term Goals: Week 2:  OT Short Term Goal 1 (Week 2): Pt will bathe lower body with mod A OT Short Term Goal 1 - Progress (Week 2): Met OT Short Term Goal 2 (Week 2): Pt will dress lower body with mod A OT Short Term Goal 2 - Progress (Week 2): Met OT Short Term Goal 3 (Week 2): Pt will complete lateral weight shifting with min A in preparation for LB dressing OT Short Term Goal 3 - Progress (Week 2): Progressing toward goal OT Short Term Goal 4 (Week 2): Pt will complete toileting tasks with Max A OT Short Term Goal 4 - Progress (Week 2): Met  Skilled Therapeutic Interventions/Progress Updates:  Skilled OT intervention completed with focus on BUE/core strengthening. Pt received seated in w/c agreeable to session. Pt opted to complete strengthening exercises. Education provided about continuing the HEP given for BUE strengthening at home to increase overall strength and endurance once home. Completed the following exercises:  Shoulder flexion 2x15 reps with 2 pound dowel  Chest presses 2x15 reps with 2 pound dowel  Bicep flexion 2x15 each arm with 3 pound DB [place] twist with 3 pound ball  Pt required cues for form and positioning due to decreased motor planning. Required rest breaks intermittently. Pt left seated in w/c, with alarm belt activated, and all needs in reach at end of session.    Therapy Documentation Precautions:  Precautions Precautions: Fall Precaution Comments: R BKA prosthesis not able to don 2/2 edema. Restrictions Weight Bearing Restrictions: No  Pain: Pt with no c/o pain during session.   Therapy/Group: Individual Therapy  Joon Pohle E Olimpia Tinch 10/22/2020, 7:45 AM

## 2020-10-22 NOTE — Progress Notes (Signed)
Physical Therapy Discharge Summary  Patient Details  Name: [name] MRN: [id number] Date of Birth: 11/14/1965  Today's Date: 10/23/2020 PT Individual Time: 5465-0354 PT Individual Time Calculation (min): 54 min   Patient has met 8 of 8 long term goals due to improved activity tolerance, improved balance, improved postural control, increased strength, and improved coordination. Patient to discharge at a wheelchair level Supervision. Pt's family did not attend family education training despite recommendation. Explained to pt recommendation for 24/7 supervision assist at home, however pt disagrees and feels like she will be able to manage everything independently like she did prior to admission.   All goals met  Recommendation:  Patient will benefit from ongoing skilled PT services in home health setting to continue to advance safe functional mobility, address ongoing impairments in transfers, safety awareness, generalized strengthening, dynamic standing balance/coordination, amputee education, gait training, endurance, and to minimize fall risk.  Equipment: No equipment provided; pt already has WC and declined a slideboard  Reasons for discharge: treatment goals met and discharge from hospital  Patient/family agrees with progress made and goals achieved: Yes  Today's Interventions: Received pt sitting in WC, pt agreeable to PT treatment, and denied any pain during session. Session with emphasis on discharge planning, functional mobility/transfers, generalized strengthening, dynamic standing balance/coordination, and improved activity tolerance. Pt performed WC mobility 243f using BUE and LLE mod I. Pt transferred on/off Nustep via squat<>pivot with close supervision and performed BUE and LLE strengthening on Nustep at workload 3 for 10 minutes for a total of 262 steps with emphasis on cardiovascular endurance and L hip strengthening/ROM with 1 rest break. Worked on blocked practice  sit<>stands from [facility] and min A x 5 reps pushing up from WPhoe[facility] Centerarmrests - cues for technique and to scoot to edge of chair. Pt [facility]. Concluded session with pt sitting in WC, needs within reach, and seatbelt alarm on. Provided pt with fresh drinks.  PT Discharge Precautions/Restrictions Precautions Precautions: Fall Precaution Comments: old R BKA but unable to use prosthetic due to edema Restrictions Weight Bearing Restrictions: No Pain Interference Pain Interference Pain Effect on Sleep: 0. Does not apply - I have not had any pain or hurting in the past 5 days Pain Interference with Therapy Activities: 0. Does not apply - I have not received rehabilitationtherapy in the past 5 days Pain Interference with Day-to-Day Activities: 1. Rarely or not at all Cognition Overall Cognitive Status: Within Functional Limits for tasks assessed Arousal/Alertness: Awake/alert Orientation Level: Oriented X4 Memory: Impaired Awareness: Impaired Problem Solving: Impaired Sequencing: Impaired Safety/Judgment: Impaired Comments: decreased insight into deficits Sensation Sensation Light Touch: Impaired by gross assessment Proprioception: Appears Intact Additional Comments: absent sensation along L lateral malleoli and great toe and along R residual limb Coordination Gross Motor Movements are Fluid and Coordinated: No Fine Motor Movements are Fluid and Coordinated: No (decreased precision and increased time) Coordination and Movement Description: generalized weakness/deconditioning, decreased balance/postural control Finger Nose Finger Test: decreased precision and increased time Heel Shin Test: WFL on LLE, unable to perform on RLE due to BKA Motor  Motor Motor: Within Functional Limits Motor - Skilled Clinical Observations: generalized weakness/deconditioning, decreased balance/postural control  Mobility Bed Mobility Bed Mobility: Rolling Right;Rolling Left;Sit  to Supine;Supine to Sit Rolling Right: Independent Rolling Left: Independent Supine to Sit: Independent Sit to Supine: Independent Transfers Transfers: Sit to Stand;Stand to Sit;Squat Pivot Transfers;Lateral/Scoot Transfers Sit to Stand: Minimal Assistance - Patient > 75% Stand to  Sit: Contact Guard/Touching assist Squat Pivot Transfers: Contact Guard/Touching assist Lateral/Scoot Transfers: Supervision/Verbal cueing Lateral Transfer Comment: slideboard Transfer (Assistive device): Rolling walker Locomotion  Gait Ambulation: No Gait Gait: No Stairs / Additional Locomotion Stairs: No Architect: Yes Wheelchair Assistance: Independent with Camera operator: Both upper extremities Wheelchair Parts Management: Needs assistance Distance: 222f  Trunk/Postural Assessment  Cervical Assessment Cervical Assessment: Within Functional Limits Thoracic Assessment Thoracic Assessment: Exceptions to WFL (mild kyphosis) Lumbar Assessment Lumbar Assessment: Exceptions to WMaine [facility](posterior pelvic tilt)  Balance Balance Balance Assessed: Yes Static Sitting Balance Static Sitting - Balance Support: Feet supported;Bilateral upper extremity supported Static Sitting - Level of Assistance: 6: Modified independent (Device/Increase time) Dynamic Sitting Balance Dynamic Sitting - Balance Support: Feet supported;No upper extremity supported Dynamic Sitting - Level of Assistance: 6: Modified independent (Device/Increase time) Static Standing Balance Static Standing - Balance Support: Bilateral upper extremity supported (RW) Static Standing - Level of Assistance: 5: Stand by assistance (CGA) Dynamic Standing Balance Dynamic Standing - Balance Support: Right upper extremity supported (RW) Dynamic Standing - Level of Assistance: 4: Min assist Extremity Assessment  RLE Assessment RLE Assessment: Exceptions to WPioneer Memorial HospitalRLE Strength Right Hip Flexion:  4/5 Right Hip ABduction: 4+/5 Right Hip ADduction: 4+/5 Right Knee Flexion: 4/5 Right Knee Extension: 4/5 LLE Assessment LLE Assessment: Exceptions to WThe [facility] Medical GroupGeneral Strength Comments: grossly generalized to 4+/5  AAlfonse AlpersPT, DPT  10/22/2020, 9:30 AM

## 2020-10-22 NOTE — Progress Notes (Signed)
Physical Therapy Session Note  Patient Details  Name: [name] MRN: [id number] Date of Birth: March 01, 1965  Today's Date: 10/22/2020 PT Individual Time: 0901-0956 PT Individual Time Calculation (min): 55 min   Short Term Goals: Week 2:  PT Short Term Goal 1 (Week 2): pt will transfer bed<>WC with LRAD and CGA consistantly PT Short Term Goal 1 - Progress (Week 2): Met PT Short Term Goal 2 (Week 2): pt will transfer sit<>stand with LRAD and mod A PT Short Term Goal 2 - Progress (Week 2): Progressing toward goal PT Short Term Goal 3 (Week 2): pt will perform simulated car transfer with LRAD and min A PT Short Term Goal 3 - Progress (Week 2): Met Week 3:  PT Short Term Goal 1 (Week 3): STG=LTG due to LOS  Skilled Therapeutic Interventions/Progress Updates:   Received pt sitting upright in bed on the bedpan, reporting it was "faster than getting on the commode". Pt agreeable to PT treatment and denied any pain during session. Session with emphasis on toileting, dressing, functional mobility/transfers, generalized strengthening, dynamic standing balance/coordination. Pt required increased time on bedpan but while sitting on bedpan requested to put on shirt - doffed gown and donned bra and pull over shirt with set up assist. Discussed recommendation for 24/7 supervision at home, however pt reported confidence in managing everything independently stating "I was doing it before" - pt demonstrates decreased insight into deficits and poor safety awareness overall. Pt with loose, green, foul smelling stool. Rolled to R mod I to remove bedpan and requested therapist clean her - dependent for peri-care and donned new brief with max A. Pt transferred supine<>sitting EOB mod I and donned pants up to thighs with supervision. Sit<>stand from elevated EOB with RW and mod A and required total A to pull pants over hips. Squat<>pivot bed<>WC with CGA and cues for technique and concluded session with pt sitting  in WC, needs within reach, and seatbelt alarm on. Provided pt with fresh ice water.   Therapy Documentation Precautions:  Precautions Precautions: Fall Precaution Comments: R BKA prosthesis not able to don 2/2 edema. Restrictions Weight Bearing Restrictions: No  Therapy/Group: Individual Therapy Alfonse Alpers PT, DPT   10/22/2020, 7:21 AM

## 2020-10-22 NOTE — Progress Notes (Signed)
PROGRESS NOTE   Subjective/Complaints: She is feeling very well today Ready for d/c on Sunday Asks about her medications upon discharge Asks about swelling in her lower extremities.   ROS: Denies CP, SOB, N/V/D, +lower extremity swelling  Objective:   No results found. Recent Labs    10/20/20 0535  WBC 8.1  HGB 9.6*  HCT 33.2*  PLT 333     Recent Labs    10/20/20 0535 10/22/20 0532  NA 137 130*  K 4.2 3.8  CL 104 98  CO2 25 24  GLUCOSE 106* 124*  BUN 26* 24*  CREATININE 1.28* 1.20*  CALCIUM 9.0 8.3*     Intake/Output Summary (Last 24 hours) at 10/22/2020 1219 Last data filed at 10/21/2020 2134 Gross per 24 hour  Intake 600 ml  Output --  Net 600 ml        Physical Exam: Vital Signs Blood pressure (!) 158/66, pulse 73, temperature 98.4 F (36.9 C), resp. rate 18, height 5\' 7"  (1.702 m), weight 103 kg, SpO2 92 %. Gen: no distress, normal appearing HEENT: oral mucosa pink and moist, NCAT Cardio: Reg rate Respiratory: Normal effort.  No stridor.  Bilateral clear to auscultation.  + [place]. GI: Non-distended.  BS +. Skin: Warm and dry.  Intact. Psych: Normal mood.  Normal behavior. Musc: +bilateral lower extremity edema. No tenderness in extremities. Neuro: Alert Motor: Grossly 4/5 throughout  Assessment/Plan: 1. Functional deficits which require 3+ hours per day of interdisciplinary therapy in a comprehensive inpatient rehab setting. Physiatrist is providing close team supervision and 24 hour management of active medical problems listed below. Physiatrist and rehab team continue to assess barriers to discharge/monitor patient progress toward functional and medical goals  Care Tool:  Bathing    Body parts bathed by patient: Right arm, Left arm, Chest, Abdomen, Front perineal area, Right upper leg, Left upper leg, Buttocks, Face, Right lower leg, Left lower leg   Body parts bathed by helper:  Buttocks Body parts n/a: Right lower leg   Bathing assist Assist Level: Set up assist     Upper Body Dressing/Undressing Upper body dressing   What is the patient wearing?: Pull over shirt, Bra    Upper body assist Assist Level: Set up assist    Lower Body Dressing/Undressing Lower body dressing      What is the patient wearing?: Underwear/pull up, Pants     Lower body assist Assist for lower body dressing: Supervision/Verbal cueing Assistive Device Comment: threaded pants at EOB with supervision, rolling R<>L needed for donning past waist   Toileting Toileting    Toileting assist Assist for toileting: Minimal Assistance - Patient > 75%     Transfers Chair/bed transfer  Transfers assist     Chair/bed transfer assist level: Contact Guard/Touching assist     Locomotion Ambulation   Ambulation assist   Ambulation activity did not occur: Safety/medical concerns          Walk 10 feet activity   Assist  Walk 10 feet activity did not occur: Safety/medical concerns        Walk 50 feet activity   Assist Walk 50 feet with 2 turns activity did not occur:  Safety/medical concerns         Walk 150 feet activity   Assist Walk 150 feet activity did not occur: Safety/medical concerns         Walk 10 feet on uneven surface  activity   Assist Walk 10 feet on uneven surfaces activity did not occur: Safety/medical concerns         Wheelchair     Assist Is the patient using a wheelchair?: Yes Type of Wheelchair: Manual    Wheelchair assist level: Supervision/Verbal cueing Max wheelchair distance: >343ft    Wheelchair 50 feet with 2 turns activity    Assist        Assist Level: Supervision/Verbal cueing   Wheelchair 150 feet activity     Assist      Assist Level: Supervision/Verbal cueing   Blood pressure (!) 158/66, pulse 73, temperature 98.4 F (36.9 C), resp. rate 18, height 5\' 7"  (1.702 m), weight 103 kg, SpO2 92  %.  Medical Problem List and Plan: 1.  Debility secondary to ITP/s/p splenectomy and old R BKA- hasn't worn her new prosthesis due to fitting issues  Continue CIR 2.  Impaired mobility: continue Lovenox             -antiplatelet therapy: N/A 3. Intermittent RLE pain: d/c oxycodone. Provided with a pain relief journal  Controlled with meds on 10/154. Mood: LCSW to follow for evaluation and support.              -antipsychotic agents: N/A 5. Neuropsych: This patient is capable of making decisions on her own behalf. 6. Skin/Wound Care: Monitor wound for healing.   7. Fluids/Electrolytes/Nutrition: Monitor I/Os.   Albumin remains low but slightly higher than prior now up to 2.5 8. ITP/pancytopenia/anemia:Improving hold off on aspirin.   Hemoglobin 9.2 on 10/10, labs ordered for tomorrow 9. MRSE bacteremia/HAP: IV antibiotics transitioned to Augmentin thru 10/20/20 10. Left pleural effusion s/p thoracotomy 9/19: To follow up with pulmonary after discharge.                   --encourage pulmonary hygiene with flutter valve.  11. Acute on chronic CHF: Decrease Lasix to 40mg  BID.               --monitor I/O. Monitor for signs of overload.             --continue Toprol XL, Lasix  Filed Weights   10/20/20 0418 10/21/20 0541 10/22/20 0357  Weight: 103.9 kg 103.7 kg 103 kg   12. HTN: Monitor BP TID--continue Toprol, Lasix, Imdur. Increase Lasix to 50mg  BID  Increased imdur to 45mg  starting 10/9  D/c amlodipine given leg swelling. Add clonodine patch for BP and pain.  Vitals:   10/21/20 1958 10/22/20 0357  BP: (!) 153/64 (!) 158/66  Pulse: 74 73  Resp: 18 18  Temp: 98.8 F (37.1 C) 98.4 F (36.9 C)  SpO2: 98% 92%   Norvasc 2.5 started on 10/15 13. T2DM: Hgb A1C- 5.5 and well controlled on metformin BID PTA             --on hold due to AKI. Will continue to monitor BS ac/hs and use SSI for now  CBG (last 3)  Recent Labs    10/21/20 2119 10/22/20 0620 10/22/20 1122  GLUCAP 143* 125*  148*    Controlled on 10/15 14. Acute on chronic CKD III:  Lasix has been uptitrated to 50mg  BID given painful swelling at times Creatinine 1.28, repeat Wednesday 15.  Thyroid nodules: Follow up with [facility] after discharge.  16.  Cecal wall thickening: Will need to contact GI when nearing discharge to determine timing of colonoscopy.  17. Severe obesity BMI 36.95: provide dietary education,  18.  Thrombocytosis will monitor platelets, expect this to remain elevated status post splenectomy  Platelets 46 on 10/10, labs ordered for Monday 19. Urinary retention: UC+ for >100,000 colonies yeast- switched macrobid to diflucan 20. Left sided low back pain: continue kpad. 21. Leukocytosis: resolved, monitor outpatient.  22. Hypoxia: continue oxygen via nasal cannula with goal of 96%.  Patient continues to require on 10/19- wean as tolerated, discussed with her and team that she will likely continue to need at home. 23.  Mild hyponatremia: 130 on 10/19, repeat tomorrow to trend.   LOS: 20 days A FACE TO FACE EVALUATION WAS PERFORMED  Drema Pry Esias Mory 10/22/2020, 12:19 PM

## 2020-10-22 NOTE — Progress Notes (Signed)
Physical Therapy Session Note  Patient Details  Name: [name] MRN: [id number] Date of Birth: [birth year]  Today's Date: 10/22/2020 PT Individual Time: 1300-1345 PT Individual Time Calculation (min): 45 min   Short Term Goals: Week 3:  PT Short Term Goal 1 (Week 3): STG=LTG due to LOS  Skilled Therapeutic Interventions/Progress Updates: Pt presents sitting in w/c and agreeable to therapy.  Pt states just putting O2 on @ 1 LPM, w/O2 at 99%.  O2 removed and sats remained at 99% 2 minutes post. Pt wheeled self to Dayroom w/ supervision w/ O2 remaining at 98% on RA.  Pt performed multiple sit to stand transfers w/ min A and occasional verbal cues for forward lean, pt stood > 2 minutes before requesting to sit.  O2 decreased to 89% on RA butincreased to97% w/in 20 seconds.  Pt performed multiple squat pivots w/c<> Nu-step seat w/ CGA.  Pt requires seated rest breaks between trials.  Pt returned to room and remained sitting in w/c w/ chair alarm on and all needs in reach.      Therapy Documentation Precautions:  Precautions Precautions: Fall Precaution Comments: old R BKA but unable to use prosthetic due to edema Restrictions Weight Bearing Restrictions: No General:   Vital Signs: Therapy Vitals Temp: (!) 97.5 F (36.4 C) Temp Source: Oral Pulse Rate: 67 Resp: 16 BP: (!) 132/53 Patient Position (if appropriate): Sitting Oxygen Therapy SpO2: 93 % O2 Device: Nasal Cannula Pain:0/10   Mobility: Bed Mobility Bed Mobility: Rolling Right;Supine to Sit Rolling Right: Independent with assistive device Rolling Left: Independent with assistive device Supine to Sit: Independent with assistive device Sitting - Scoot to Edge of Bed: Independent with assistive device Transfers Transfers: Sit to Stand;Lateral/Scoot Transfers;Squat Pivot Transfers Sit to Stand: Contact Guard/Touching assist Stand to Sit: Minimal Assistance - Patient > 75% Squat Pivot Transfers: Moderate Assistance  - Patient 50-74% Lateral/Scoot Transfers: Contact Guard/Touching assist Transfer (Assistive device): Rolling walker Locomotion :    Trunk/Postural Assessment : Cervical Assessment Cervical Assessment: Within Functional Limits Thoracic Assessment Thoracic Assessment: Exceptions to [facility] (mild kyphosis) Lumbar Assessment Lumbar Assessment: Exceptions to [facility] (posterior pelvic tilt) Postural Control Postural Control: Deficits on evaluation Trunk Control: uses UEs for support.  Balance: Balance Balance Assessed: Yes Dynamic Sitting Balance Dynamic Sitting - Balance Support: Feet supported;During functional activity Dynamic Sitting - Level of Assistance: 6: Modified independent (Device/Increase time) Static Standing Balance Static Standing - Balance Support: Bilateral upper extremity supported Static Standing - Level of Assistance: Other (comment) (CGA) Exercises:   Other Treatments:      Therapy/Group: Individual Therapy  Lucio Edward 10/22/2020, 2:24 PM

## 2020-10-22 NOTE — Progress Notes (Signed)
Occupational Therapy Discharge Summary  Patient Details  Name: [name] MRN: [id number] Date of Birth: December 30, 1965   Patient has met 7 of 8 long term goals due to improved activity tolerance, improved balance, postural control, ability to compensate for deficits, improved attention, and improved coordination.  Patient to discharge at overall set up BADLs, CGA for sit> stand with RW and min A for stand pivot transfers with RW .  Patient's care partner highschool aged son lives at home with pt and can be around intermittently to provide supervision/min A, however pt has been recommended for [employer] to provide additional assistance and environmental set ups for best navigation and safety.    Reasons goals not met: Pt unable to meet one LTG due to refusal to participate in dynamic standing balance tasks during ADLs, however pt demonstrated ability to release one hand from walker in standing to simulate doffing pants.   Recommendation:  Patient will benefit from ongoing skilled OT services in home health setting to continue to advance functional skills in the area of BADL, iADL, and Reduce care partner burden.  Equipment: Recommended a drop arm BSC  Reasons for discharge: treatment goals met  Patient/family agrees with progress made and goals achieved: Yes  OT Discharge Precautions/Restrictions  Precautions Precautions: Fall Precaution Comments: old R BKA but unable to use prosthetic due to edema Restrictions Weight Bearing Restrictions: No Pain Pain Assessment Pain Scale: 0-10 Pain Score: 0-No pain ADL ADL Upper Body Bathing: Supervision/safety Where Assessed-Upper Body Bathing: Edge of bed Lower Body Bathing: Other (comment) (Total A) Where Assessed-Lower Body Bathing: Edge of bed, Bed level Upper Body Dressing: Minimal assistance (min A to assist with manuevering oxygen tube whil donning shirt) Where Assessed-Upper Body Dressing: Edge of bed Lower Body Dressing: Maximal  assistance Where Assessed-Lower Body Dressing: Edge of bed, Bed level (pt able to thread pants EOB, but needed Max A at bed level to pull up pants using rolling technique) Toileting: Other (Comment) (Total A, incontinent) Where Assessed-Toileting: Bed level ADL Comments: Pt attempted to stand with RW at baseline, however pt unable to clear hips in order for therapist to donn pants over hips, LB dressing completed at bed level after threading Vision Baseline Vision/History: 1 Wears glasses Patient Visual Report: No change from baseline Vision Assessment?: Yes Eye Alignment: Within Functional Limits Tracking/Visual Pursuits: Decreased smoothness of eye movement to LEFT inferior field Saccades: Within functional limits Perception  Perception: Within Functional Limits Praxis Praxis: Intact Cognition Overall Cognitive Status: Within Functional Limits for tasks assessed Arousal/Alertness: Awake/alert Orientation Level: Oriented X4 Year: 2022 Month: October Day of Week: Correct Divided Attention: Impaired Memory: Impaired Immediate Memory Recall: Sock;Blue;Bed Memory Recall Sock: Without Cue Memory Recall Blue: Without Cue Memory Recall Bed: Without Cue Awareness: Impaired Problem Solving: Impaired Executive Function: Initiating;Sequencing Sequencing: Impaired Initiating: Impaired Safety/Judgment: Impaired Comments: decreased insight into deficits Sensation Sensation Light Touch: Impaired by gross assessment Proprioception: Appears Intact Additional Comments: Pt reports light touch feeling the same in both arms but with her last 3 digits on her L hand they feel numb and has been PTA Coordination Gross Motor Movements are Fluid and Coordinated: Yes Fine Motor Movements are Fluid and Coordinated: No (decreased precision and increased time) Finger Nose Finger Test: [facility], however decreased precision Motor  Motor Motor: Within Functional Limits Motor - Skilled Clinical  Observations: generalized weakness/deconditioning, decreased balance/postural control Mobility  Bed Mobility Bed Mobility: Rolling Right;Supine to Sit Rolling Right: Independent with assistive device Rolling Left: Independent with assistive device  Supine to Sit: Independent with assistive device Sitting - Scoot to Edge of Bed: Independent with assistive device Transfers Sit to Stand: Contact Guard/Touching assist Stand to Sit: Minimal Assistance - Patient > 75%  Trunk/Postural Assessment  Cervical Assessment Cervical Assessment: Within Functional Limits Thoracic Assessment Thoracic Assessment: Exceptions to [facility] (mild kyphosis) Lumbar Assessment Lumbar Assessment: Exceptions to [facility] (posterior pelvic tilt) Postural Control Postural Control: Deficits on evaluation Trunk Control: uses UEs for support.  Balance Balance Balance Assessed: Yes Dynamic Sitting Balance Dynamic Sitting - Balance Support: Feet supported;During functional activity Dynamic Sitting - Level of Assistance: 6: Modified independent (Device/Increase time) Static Standing Balance Static Standing - Balance Support: Bilateral upper extremity supported Static Standing - Level of Assistance: Other (comment) (CGA) Extremity/Trunk Assessment RUE Assessment RUE Assessment: Within Functional Limits Active Range of Motion (AROM) Comments: WFL LUE Assessment LUE Assessment: Within Functional Limits Active Range of Motion (AROM) Comments: [facility]   Carles Florea E Lucero Ide 10/22/2020, 10:55 AM

## 2020-10-22 NOTE — Progress Notes (Signed)
Occupational Therapy Session Note  Patient Details  Name: [name] MRN: [id number] Date of Birth: 1965/10/03  Today's Date: 10/22/2020 OT Individual Time: 0539-7673 OT Individual Time Calculation (min): 54 min    Short Term Goals: Week 2:  OT Short Term Goal 1 (Week 2): Pt will bathe lower body with mod A OT Short Term Goal 1 - Progress (Week 2): Met OT Short Term Goal 2 (Week 2): Pt will dress lower body with mod A OT Short Term Goal 2 - Progress (Week 2): Met OT Short Term Goal 3 (Week 2): Pt will complete lateral weight shifting with min A in preparation for LB dressing OT Short Term Goal 3 - Progress (Week 2): Progressing toward goal OT Short Term Goal 4 (Week 2): Pt will complete toileting tasks with Max A OT Short Term Goal 4 - Progress (Week 2): Met  Skilled Therapeutic Interventions/Progress Updates:  Skilled OT intervention completed with focus on discharge planning, discussion of rehab goals and ADL tasks and functional transfers. Pt received seated in w/c, agreeable to session. Completed grooming tasks seated at sink, with pt requiring increased time due to distractions with NT in room changing sheets/EVS cleaning room. Discussed progress with rehab goals, as well as discharge recommendations including to complete ADLs seated for safety vs her method of leaning on her sink/partially leaning on w/c to balance. Pt is determined to complete tasks the way she wants to when home, despite OT recommendations for safe/independent level of task completion. Pt completed 5 sit <> from w/c using RW, with CGA. Pt demonstrated increased challenge with processing how to stand, sequence of steps and when to initiate, being unable to stand upon first trial, however improved upon cues given to help motor plan. Pt able to remain standing 1 min each trial with CGA-supervision. Increased challenge by having pt release one hand from walker to reach/tap different parts of pt's body to simulate  releasing one hand for clothing/toileting. Pt presented no LOB, requiring CGA throughout, demonstrating an improved ability in dynamic balance, however pt reporting she didn't like the feeling. Required intermittent seated rest breaks between trials. Pt left seated in w/c, positioned for comfort, with alarm belt activated and all needs in reach at end of session.   Therapy Documentation Precautions:  Precautions Precautions: Fall Precaution Comments: R BKA prosthesis not able to don 2/2 edema. Restrictions Weight Bearing Restrictions: No  Pain: Pt with no c/o pain during session.   Therapy/Group: Individual Therapy  Cortavius Montesinos E Santhiago Collingsworth 10/22/2020, 7:42 AM

## 2020-10-23 LAB — GLUCOSE, CAPILLARY
Glucose-Capillary: 116 mg/dL — ABNORMAL HIGH (ref 70–99)
Glucose-Capillary: 137 mg/dL — ABNORMAL HIGH (ref 70–99)
Glucose-Capillary: 163 mg/dL — ABNORMAL HIGH (ref 70–99)
Glucose-Capillary: 125 mg/dL — ABNORMAL HIGH (ref 70–99)

## 2020-10-23 LAB — BASIC METABOLIC PANEL
Anion gap: 10 (ref 5–15)
BUN: 28 mg/dL — ABNORMAL HIGH (ref 6–20)
CO2: 24 mmol/L (ref 22–32)
Calcium: 8.8 mg/dL — ABNORMAL LOW (ref 8.9–10.3)
Chloride: 102 mmol/L (ref 98–111)
Creatinine, Ser: 1.36 mg/dL — ABNORMAL HIGH (ref 0.44–1.00)
GFR, Estimated: 47 mL/min — ABNORMAL LOW (ref >60)
Glucose, Bld: 117 mg/dL — ABNORMAL HIGH (ref 70–99)
Potassium: 4.2 mmol/L (ref 3.5–5.1)
Sodium: 136 mmol/L (ref 135–145)

## 2020-10-23 NOTE — Progress Notes (Signed)
Inpatient Rehabilitation Care Coordinator Discharge Note   Patient Details  Name: [name] MRN: [id number] Date of Birth: [birth year]   Discharge location: HOME WITH CHILDREN  Length of Stay:  22 DAYS  Discharge activity level: CGA WHEELCHAIR LEVEL  Home/community participation: ACTIVE  Patient response KF:EXMDYJ Literacy - How often do you need to have someone help you when you read instructions, pamphlets, or other written material from your doctor or pharmacy?: Never  Patient response WL:KHVFMB Isolation - How often do you feel lonely or isolated from those around you?: Never  Services provided included: MD, RD, PT, OT, RN, CM, Pharmacy, Neuropsych, SW  Financial Services:  Field seismologist Utilized: Marine scientist MEDICARE  Choices offered to/list presented to: PT  Follow-up services arranged:  Home Health, Patient/Family request agency HH/DME Home Health Agency: [facility]-PT,OT,AIDE,SW PT HAS ALL NEEDED EQUIPMENT BUT DOES NEED TO GET HER LEFT BRAKE ON HER WHEELCHAIR FIXED-SHE IS AWARE [employer] WILL COME OUT AND FIX IT SHE IS RESPONSIBLE FOR BILL DUE TO IT IS HER CHAIR NOW. OWES [employer] THEREFORE COULD NOT GET ANY OTHER EQUIPMENT FORM THEM UNTIL SHE PAYS HER BILL. AWARE AND HAS PHONE NUMBER TO CALL. SHE HAD WANTED A NEW WHEELCHAIR. HAS HOME O2 BOTH CONCENTRATOR AND PORTABLE BUT DID NOT USE IT. PLANS TO SEE IF NEEDS THEN SEND IT BACK TO [employer].      HH/DME Requested Agency: [facility]  Patient response to transportation need: Is the patient able to respond to transportation needs?: Yes In the past 12 months, has lack of transportation kept you from medical appointments or from getting medications?: No In the past 12 months, has lack of transportation kept you from meetings, work, or from getting things needed for daily living?: No    Comments (or additional information): TEAM RECOMMENDS 24/7 CARE IN WHICH PT DOES NOT HAVE AT HOME. HER CHILDREN  ARE IN SCHOOL OLDEST 16 YO SON DOES HELP WHEN HE IS HOME. ASKED TO CONTACT HER MOM WHICH PT DECLINED THIS. FEELS CAN MANAGE AT HOME WHILE CHILDREN ARE IN SCHOOL. PT IN DENIAL REGARDING HER CARE NEEDS, BUT COMPETENT TO MAKE HER OWN DECISIONS. HAS TRANSPORT VIA [employer] FRIEND. CONCERNED WILL BE HIGH RISK TO RETURN TO THE HOSPITAL  Patient/Family verbalized understanding of follow-up arrangements:  Yes  Individual responsible for coordination of the follow-up plan: SELF [id number]  Confirmed correct DME delivered: Lucy Chris 10/23/2020    Lucy Chris

## 2020-10-23 NOTE — Progress Notes (Signed)
F/u- Inpatient Rehabilitation Medication Discharge Review by a Pharmacist  A complete drug regimen review was completed for this patient to identify any potential clinically significant medication issues.  High Risk Drug Classes Is patient taking? Indication by Medication  Antipsychotic Yes Compazine prn nausea while here only  Anticoagulant Yes LMWH for DVT prophx while here only  Antibiotic No Completed  Opioid No Off  Antiplatelet No   Hypoglycemics/insulin Yes SSI for DM  Vasoactive Medication Yes Imdur, Toprol for CHF, HTN  Chemotherapy No   Other yes Crestor lipids     Type of Medication Issue Identified Description of Issue Recommendation(s)  Drug Interaction(s) (clinically significant)     Duplicate Therapy     Allergy     No Medication Administration End Date     Incorrect Dose     Additional Drug Therapy Needed     Significant med changes from prior encounter (inform family/care partners about these prior to discharge).    Other       Clinically significant medication issues were identified that warrant physician communication and completion of prescribed/recommended actions by midnight of the next day:  No  Name of provider notified for urgent issues identified:  Provider Method of Notification:   Pharmacist comments:   Ulyses Southward, PharmD, BCIDP, AAHIVP, CPP Infectious Disease Pharmacist 10/23/2020 3:33 PM

## 2020-10-23 NOTE — Progress Notes (Signed)
Occupational Therapy Session Note  Patient Details  Name: [name] MRN: [id number] Date of Birth: 08/13/1965  Today's Date: 10/23/2020 OT Individual Time: 1000-1056 OT Individual Time Calculation (min): 56 min    Today's Date: 10/23/2020 OT Individual Time: 1435-1530 OT Individual Time Calculation (min): 55 min   Short Term Goals: Week 1:  OT Short Term Goal 1 (Week 1): Pt will bathe lower body with mod A OT Short Term Goal 1 - Progress (Week 1): Progressing toward goal OT Short Term Goal 2 (Week 1): Pt will dress lower body with max A OT Short Term Goal 2 - Progress (Week 1): Met OT Short Term Goal 3 (Week 1): Pt will complete lateral weight shifting with mod A in preparation for LB dressing OT Short Term Goal 3 - Progress (Week 1): Met OT Short Term Goal 4 (Week 1): Pt will complete toileting tasks with Max A OT Short Term Goal 4 - Progress (Week 1): Progressing toward goal OT Short Term Goal 5 (Week 1): Pt will complete toileting transfer with Max A OT Short Term Goal 5 - Progress (Week 1): Met  Skilled Therapeutic Interventions/Progress Updates:    Session 1:  Pt received in bed with RN and NT finishing up peri care. Pt reporting and OK morning and no pain.  ADL: Pt completes ADL at overall min-MOD A Level. Skilled interventions include: VC for body mechanics, education on w/c parts management and providing options for self care adaptations. Pt declines single leg bridging as well as lateral leans to manage pants. Pt dons pants at sit to stand level at EOB with A to advance pants past hips and set up for footwear. Pt using bed to stabilize when attempting to adjust pants to pt liking after declining to attempt to advance herself. Pt completes grooming with set up.   Therapeutic activity Pt requesting to work on standing. Pt completes 2 separate standing balance tasks (clothes pins on basketball hoop and matching cards on vertical mirror) to isolate unilateral UE  support on RW reaching in min ranges outside BOS/shifting CoG. Pt very anxious with all activities, but completes with CGA. Pt unwilling to step slighly away from mat to continue to challenge balance as pt rests bak of legs on mat edge.   Pt left at end of session in w/c with exit alarm on, call light in reach and all needs met  Session 2:  Pt received in w/c with no pain. Squat pivot to/from w/c with Supervision and VC for w/c parts management.  Therapeutic exercise Circuit style session for BUE/tricep focus in prep for strengthening for transitional movement/sit to stand:  Pt completes 3x30 ball toss (chest, bounce, overhead pass) in unsupported sitting position with 1.5 # wrist weights to improve BUE coordination/strengthening required for BADLs/functional transfers.  W/c push ups 3x5  Sit ups to foam wedge with beach ball 3x10 .  Pt completes 2x10 dowel rod therex for BUE shoulder strengthening required for BADLs/functional transfers as follows with demo cuing and 8 # dowel rod  Shoulder flex/ext, shoulder press Elbow flex/ext, chest press  Pt left at end of session in w/c with exit alarm on, call light in reach and all needs met   Therapy Documentation Precautions:  Precautions Precautions: Fall Precaution Comments: old R BKA but unable to use prosthetic due to edema Restrictions Weight Bearing Restrictions: No General:    Therapy/Group: Individual Therapy  Stephanie M Schlosser 10/23/2020, 6:54 AM 

## 2020-10-23 NOTE — Plan of Care (Signed)
Care plan completed/met 

## 2020-10-23 NOTE — Progress Notes (Signed)
PROGRESS NOTE   Subjective/Complaints: Patient's chart reviewed- No issues reported overnight Vitals signs stable  Appreciate SW discussing discharge with patient today Appreciate pharmacy review of medications  ROS: Denies CP, SOB, N/V/D, +lower extremity swelling  Objective:   No results found. No results for input(s): WBC, HGB, HCT, PLT in the last 72 hours.    Recent Labs    10/22/20 0532 10/23/20 0520  NA 130* 136  K 3.8 4.2  CL 98 102  CO2 24 24  GLUCOSE 124* 117*  BUN 24* 28*  CREATININE 1.20* 1.36*  CALCIUM 8.3* 8.8*     Intake/Output Summary (Last 24 hours) at 10/23/2020 1915 Last data filed at 10/23/2020 1300 Gross per 24 hour  Intake 440 ml  Output --  Net 440 ml        Physical Exam: Vital Signs Blood pressure (!) 150/66, pulse 66, temperature 98.1 F (36.7 C), resp. rate 17, height 5\' 7"  (1.702 m), weight 103 kg, SpO2 97 %. Gen: no distress, normal appearing HEENT: oral mucosa pink and moist, NCAT Cardio: Reg rate Respiratory: Normal effort.  No stridor.  Bilateral clear to auscultation.  + [place]. GI: Non-distended.  BS +. Skin: Warm and dry.  Intact. Psych: Normal mood.  Normal behavior. Musc: +bilateral lower extremity edema. No tenderness in extremities. Neuro: Alert Motor: Grossly 4/5 throughout GU: on bedpan  Assessment/Plan: 1. Functional deficits which require 3+ hours per day of interdisciplinary therapy in a comprehensive inpatient rehab setting. Physiatrist is providing close team supervision and 24 hour management of active medical problems listed below. Physiatrist and rehab team continue to assess barriers to discharge/monitor patient progress toward functional and medical goals  Care Tool:  Bathing    Body parts bathed by patient: Right arm, Left arm, Chest, Abdomen, Front perineal area, Right upper leg, Left upper leg, Buttocks, Face, Left lower leg   Body parts  bathed by helper: Buttocks Body parts n/a: Right lower leg   Bathing assist Assist Level: Set up assist     Upper Body Dressing/Undressing Upper body dressing   What is the patient wearing?: Pull over shirt, Bra    Upper body assist Assist Level: Set up assist    Lower Body Dressing/Undressing Lower body dressing      What is the patient wearing?: Underwear/pull up, Pants     Lower body assist Assist for lower body dressing: Set up assist Assistive Device Comment: threaded pants at EOB with supervision, rolling R<>L needed for donning past waist   Toileting Toileting    Toileting assist Assist for toileting: Minimal Assistance - Patient > 75%     Transfers Chair/bed transfer  Transfers assist     Chair/bed transfer assist level: Supervision/Verbal cueing     Locomotion Ambulation   Ambulation assist   Ambulation activity did not occur: Safety/medical concerns (generalized weakness/deconditioning, decreased balance/postural control)          Walk 10 feet activity   Assist  Walk 10 feet activity did not occur: Safety/medical concerns (generalized weakness/deconditioning, decreased balance/postural control)        Walk 50 feet activity   Assist Walk 50 feet with 2 turns activity did not  occur: Safety/medical concerns (generalized weakness/deconditioning, decreased balance/postural control)         Walk 150 feet activity   Assist Walk 150 feet activity did not occur: Safety/medical concerns (generalized weakness/deconditioning, decreased balance/postural control)         Walk 10 feet on uneven surface  activity   Assist Walk 10 feet on uneven surfaces activity did not occur: Safety/medical concerns (generalized weakness/deconditioning, decreased balance/postural control)         Wheelchair     Assist Is the patient using a wheelchair?: Yes Type of Wheelchair: Manual    Wheelchair assist level: Independent Max wheelchair  distance: 200    Wheelchair 50 feet with 2 turns activity    Assist        Assist Level: Independent   Wheelchair 150 feet activity     Assist      Assist Level: Independent   Blood pressure (!) 150/66, pulse 66, temperature 98.1 F (36.7 C), resp. rate 17, height 5\' 7"  (1.702 m), weight 103 kg, SpO2 97 %.  Medical Problem List and Plan: 1.  Debility secondary to ITP/s/p splenectomy and old R BKA- hasn't worn her new prosthesis due to fitting issues  Continue CIR 2.  Impaired mobility: continue Lovenox             -antiplatelet therapy: N/A 3. Intermittent RLE pain: d/c oxycodone. Provided with a pain relief journal 4. Mood: LCSW to follow for evaluation and support.              -antipsychotic agents: N/A 5. Neuropsych: This patient is capable of making decisions on her own behalf. 6. Skin/Wound Care: Monitor wound for healing.   7. Fluids/Electrolytes/Nutrition: Monitor I/Os.   Albumin remains low but slightly higher than prior now up to 2.5 8. ITP/pancytopenia/anemia:Improving hold off on aspirin.   Hemoglobin 9.6 on 10/17 9. MRSE bacteremia/HAP: IV antibiotics transitioned to Augmentin thru 10/20/20- completed course.  10. Left pleural effusion s/p thoracotomy 9/19: To follow up with pulmonary after discharge.                   --continue to encourage pulmonary hygiene with flutter valve.  11. Acute on chronic CHF: Decrease Lasix to 40mg  BID.               --monitor I/O. Monitor for signs of overload.             --continue Toprol XL, Lasix  Filed Weights   10/20/20 0418 10/21/20 0541 10/22/20 0357  Weight: 103.9 kg 103.7 kg 103 kg   12. HTN: Monitor BP TID--continue Toprol, Lasix, Imdur. Increase Lasix to 50mg  BID  Increased imdur to 45mg  starting 10/9  D/c amlodipine given leg swelling. Add clonodine patch for BP and pain. Give 1 week before further titration. Vitals:   10/23/20 0543 10/23/20 1545  BP: (!) 154/73 (!) 150/66  Pulse: 65 66  Resp:  17   Temp: 98.3 F (36.8 C) 98.1 F (36.7 C)  SpO2: 97% 97%   13. T2DM: Hgb A1C- 5.5 and well controlled on metformin BID PTA             --on hold due to AKI. Will continue to monitor BS ac/hs and use SSI for now  CBG (last 3)  Recent Labs    10/23/20 0603 10/23/20 1143 10/23/20 1641  GLUCAP 116* 137* 163*    Controlled on 10/20 14. Acute on chronic CKD III:  Lasix titrated back down to 40mg  BID Creatinine  1.36 on 10/20, monitor outpatient 15. Thyroid nodules: Follow up with [facility] after discharge.  16.  Cecal wall thickening: Will need to contact GI when nearing discharge to determine timing of colonoscopy.  17. Severe obesity BMI 36.95: provide dietary education,  18.  Thrombocytosis will monitor platelets, expect this to remain elevated status post splenectomy  Platelets 46 on 10/10, labs ordered for Monday 19. Urinary retention: UC+ for >100,000 colonies yeast- switched macrobid to diflucan 20. Left sided low back pain: continue kpad. 21. Leukocytosis: resolved, monitor outpatient.  22. Hypoxia: continue oxygen via nasal cannula with goal of 96%.  Patient continues to require on 10/19- wean as tolerated, discussed with her and team that she will likely continue to need at home. 23.  Mild hyponatremia: 130 on 10/19, repeat tomorrow to trend.   LOS: 21 days A FACE TO FACE EVALUATION WAS PERFORMED  Drema Pry Tyrell Seifer 10/23/2020, 7:15 PM

## 2020-10-23 NOTE — Progress Notes (Signed)
Occupational Therapy Session Note  Patient Details  Name: [name] MRN: [id number] Date of Birth: 1965-10-30  Today's Date: 10/23/2020 OT Individual Time: 8:20-8:45 OT Individual Time Calculation (min): 25 mins    Short Term Goals: Week 3:  OT Short Term Goal 1 (Week 3): STG=LTG due to LOS  Skilled Therapeutic Interventions/Progress Updates:    Pt received in bed, on bedpan with no c/o pain. Pt overall with low frustration tolerance, demanding and rude behavior. Frequently scoffed and rolled eyes in response to tx interventions. Pt provided education on skilled OT purpose & goals for pt independence.   ADL: Skilled interventions include: Pt on bedpan, stating she was waiting for nursing to come clean her up. Pt agitated and demanding of care. Pt refused to attempt to wash buttocks and complained while OTS performed pericare. Pt total A for pericare at bed level. Pt demanded a dry towel and dried buttocks. Brief donned with pt rolling side to side. Pt completed dressing at bed level with setup. Stood with RW MIN A after multiple attempts to power up. Pt requests curtain to be closed halfway. Pt left at end of session in bed with exit alarm on, call light in reach and all needs met     Therapy/Group: Individual Therapy  Koran Seabrook 10/23/2020, 7:16 AM

## 2020-10-23 NOTE — Progress Notes (Signed)
Patient ID: [name], female   DOB: 05-24-1965, 55 y.o.   MRN: [id number] Met with pt to discuss discharge tomorrow. She feels she is doing well and will be able to manage at home. Even though the therapy team recommends min-CGA assist. No education completed due to pt declined this. Has home O2 since July and has not used plans to see if needs once home now and if not send it back to [facility]. Aware will need to contact [employer] to have fix her wheelchair brake since the chair her her's and she is responsible for it when it breaks. Pt is somewhat unrealistic regarding her level and ability to care for herself. Have added a HHSW to work with on any community resources she may be eligible for. Pt aware have made [facility] of discharge and asked for a visit Sat. Pt is capable of making her own decisions and has heard therapy team's concerns.

## 2020-10-24 ENCOUNTER — Other Ambulatory Visit (HOSPITAL_COMMUNITY): Payer: Self-pay

## 2020-10-24 ENCOUNTER — Encounter: Payer: Self-pay | Admitting: Hematology and Oncology

## 2020-10-24 DIAGNOSIS — K639 Disease of intestine, unspecified: Secondary | ICD-10-CM

## 2020-10-24 DIAGNOSIS — M545 Low back pain, unspecified: Secondary | ICD-10-CM

## 2020-10-24 LAB — GLUCOSE, CAPILLARY
Glucose-Capillary: 140 mg/dL — ABNORMAL HIGH (ref 70–99)
Glucose-Capillary: 166 mg/dL — ABNORMAL HIGH (ref 70–99)

## 2020-10-24 MED ORDER — AMMONIUM LACTATE 12 % EX LOTN
TOPICAL_LOTION | Freq: Two times a day (BID) | CUTANEOUS | 0 refills | Status: DC
  Filled 2020-10-24: qty 400, fill #0

## 2020-10-24 MED ORDER — SALINE NASAL SPRAY 0.65 % NA SOLN
1.0000 | Freq: Three times a day (TID) | NASAL | 0 refills | Status: DC
  Filled 2020-10-24: qty 44, 30d supply, fill #0

## 2020-10-24 MED ORDER — FOLIC ACID 1 MG PO TABS
2.0000 mg | ORAL_TABLET | Freq: Every day | ORAL | 0 refills | Status: DC
  Filled 2020-10-24: qty 60, 30d supply, fill #0

## 2020-10-24 MED ORDER — ACETAMINOPHEN 325 MG PO TABS
650.0000 mg | ORAL_TABLET | ORAL | Status: DC | PRN
Start: 2020-10-24 — End: 2023-02-19

## 2020-10-24 MED ORDER — FUROSEMIDE 40 MG PO TABS
40.0000 mg | ORAL_TABLET | Freq: Two times a day (BID) | ORAL | 0 refills | Status: DC
  Filled 2020-10-24: qty 60, 30d supply, fill #0

## 2020-10-24 MED ORDER — CLONIDINE 0.1 MG/24HR TD PTWK
0.1000 mg | MEDICATED_PATCH | TRANSDERMAL | 0 refills | Status: DC
Start: 2020-10-28 — End: 2023-02-05
  Filled 2020-10-24: qty 4, 28d supply, fill #0

## 2020-10-24 MED ORDER — LIDOCAINE 5 % EX PTCH
1.0000 | MEDICATED_PATCH | Freq: Every day | CUTANEOUS | 0 refills | Status: DC
Start: 2020-10-24 — End: 2023-02-05
  Filled 2020-10-24: qty 30, 30d supply, fill #0

## 2020-10-24 MED ORDER — PANTOPRAZOLE SODIUM 40 MG PO TBEC
40.0000 mg | DELAYED_RELEASE_TABLET | Freq: Every day | ORAL | 0 refills | Status: DC
  Filled 2020-10-24: qty 30, 30d supply, fill #0

## 2020-10-24 MED ORDER — FERROUS GLUCONATE 324 (38 FE) MG PO TABS
324.0000 mg | ORAL_TABLET | Freq: Two times a day (BID) | ORAL | 0 refills | Status: DC
  Filled 2020-10-24: qty 60, 30d supply, fill #0

## 2020-10-24 MED ORDER — ISOSORBIDE MONONITRATE ER 30 MG PO TB24
45.0000 mg | ORAL_TABLET | Freq: Every day | ORAL | 0 refills | Status: DC
  Filled 2020-10-24: qty 45, 30d supply, fill #0

## 2020-10-24 NOTE — Discharge Summary (Signed)
Physician Discharge Summary  Patient ID: [name] MRN: [id number] DOB/AGE: 1965/10/25 55 y.o.  Admit date: 10/02/2020 Discharge date: 10/24/2020  Discharge Diagnoses:  Principal Problem:   Debility Active Problems:   S/P BKA (below knee amputation) unilateral (HCC)   Acute blood loss anemia   CKD (chronic kidney disease)   Candida UTI   Type II diabetes mellitus (HCC)   Hyponatremia   Thrombocytosis   Low back pain   Colon abnormality--cecal wall thickening   Discharged Condition: good  Significant Diagnostic Studies: DG Chest 2 View  Result Date: 09/28/2020 CLINICAL DATA:  Hypoxemia. EXAM: CHEST - 2 VIEW COMPARISON:  09/23/2020.  CT, 09/24/2020. FINDINGS: Stable mild enlargement of the cardiopericardial silhouette. Persistent opacity at the left lung base obscures portions of the left heart border and left hemidiaphragm. Left perihilar patchy airspace opacity noted on the prior radiograph has improved. There is mild opacity at the medial right lung base consistent with atelectasis. Remainder of the lungs is clear. No pneumothorax. Left inferior hemithorax chest tube is stable from the prior exams. IMPRESSION: 1. Mild interval improvement. Opacity noted in the left perihilar lung has decreased from the prior chest radiograph and chest CT. Opacity at the left lung base is without significant change. No change in the left chest tube. 2. No pneumothorax. Electronically Signed   By: Lajean Manes M.D.   On: 09/28/2020 10:57   US THYROID  Result Date: 09/25/2020 CLINICAL DATA:  Left thyroid mass by chest CT EXAM: THYROID ULTRASOUND TECHNIQUE: Ultrasound examination of the thyroid gland and adjacent soft tissues was performed. COMPARISON:  09/24/2020, 03/08/2017 FINDINGS: Parenchymal Echotexture: Normal Isthmus: 8 mm Right lobe: 5.3 x 1.9 x 1.8 cm Left lobe: 6.9 x 5.5 x 4.7 cm _________________________________________________________ Estimated total number of nodules >/= 1 cm: 1  Number of spongiform nodules >/=  2 cm not described below (TR1): 0 Number of mixed cystic and solid nodules >/= 1.5 cm not described below ([place]): 0 _________________________________________________________ Nodule # 1: Location: Left; Mid Maximum size: 5.7, previously 4.3 cm; Other 2 dimensions: 5.3 x 4.0 cm Composition: solid/almost completely solid (2) Echogenicity: isoechoic (1) Shape: not taller-than-wide (0) Margins: ill-defined (0) Echogenic foci: none (0) ACR TI-RADS total points: 3. ACR TI-RADS risk category: TR3 (3 points). ACR TI-RADS recommendations: **Given size (>/= 2.5 cm) and appearance, fine needle aspiration of this mildly suspicious nodule should be considered based on TI-RADS criteria. _________________________________________________________ Additional benign stable subcentimeter hypoechoic nodules in the right thyroid all measuring 7 mm or less. No hypervascularity.  No regional adenopathy. IMPRESSION: Slightly larger 5.7 cm left mid thyroid TR 3 nodule continues to meet criteria for biopsy as above. The above is in keeping with the ACR TI-RADS recommendations - J Am Coll Radiol 2017;14:587-595. Electronically Signed   By: Jerilynn Mages.  Shick M.D.   On: 09/25/2020 11:20    Labs:  Basic Metabolic Panel: Recent Labs  Lab 10/20/20 0535 10/22/20 0532 10/23/20 0520  NA 137 130* 136  K 4.2 3.8 4.2  CL 104 98 102  CO2 '25 24 24  ' GLUCOSE 106* 124* 117*  BUN 26* 24* 28*  CREATININE 1.28* 1.20* 1.36*  CALCIUM 9.0 8.3* 8.8*    CBC: CBC Latest Ref Rng & Units 10/20/2020 10/13/2020 10/07/2020  WBC 4.0 - 10.5 K/uL 8.1 8.3 11.8(H)  Hemoglobin 12.0 - 15.0 g/dL 9.6(L) 9.2(L) 8.4(L)  Hematocrit 36.0 - 46.0 % 33.2(L) 31.6(L) 29.5(L)  Platelets 150 - 400 K/uL 333 486(H) 712(H)     CBG: Recent Labs  Lab 10/23/20 1143 10/23/20 1641 10/23/20 2106 10/24/20 0541 10/24/20 1128  GLUCAP 137* 163* 125* 140* 166*    Brief HPI:   [name] is a 55 y.o. female with history of HTN, T2DM,  stroke, ICH, chronic hypoxic respiratory failure, right-BKA, recurrent ITP treated with IVIG/Nplate who was admitted on 09/12/2020 with fatigue, AKI, anemia- Hgb 4.5, neutropenia with white count 2.8 and platelets < 50,000. She had not followed up with cancer center and had run out of Lasix for a month due to transportation issues. She was treated with IV diuresis, transfused with 2 units PRBCs and hyperkalemia treated with Lokelma and insulin.  She was treated with Decadron, Nplate and IVIG and underwent splenectomy on 09/15 by Dr. Barry Dienes. Postop, she developed fevers - T- 102.2 and was treated with broad-spectrum antibiotic for sepsis due to epi bacteremia with left pleural effusion.  Pleural effusion was tapped on 09/19 for 500cc fluid and cultures were negative.     She did develop hypoxia with MS changes and CT chest showed complex partially loculated pleural effusion with airspace consolidation LLL concerning for PNA as well as incidental finding of enlarged left thyroid node.  CT abdomen/pelvis was negative for infection but showed cecal wall thickening.  Dr. Arelia Longest recommended colonoscopy for evaluation once medically appropriate.  Thyroid ultrasound showed 2 thyroid masses with recommendations for FNA/surgical evaluation after discharge.  Dr. Gearldine Shown recommended IV antibiotics through 09/26 followed by 3 weeks of Augmentin given pleural effusion.  Her JP drain was pulled prior to discharge and leukocytosis was resolving.  She continued to be limited by weakness affecting mobility and ADLs.  CIR was recommended due to functional decline.   Hospital Course: [name] was admitted to rehab 10/02/2020 for inpatient therapies to consist of PT and OT at least three hours five days a week. Past admission physiatrist, therapy team and rehab RN have worked together to provide customized collaborative inpatient rehab.  Her blood pressures were monitored on TID basis and have been stable.  No signs of  overload noted.  Follow-up CBC now showed transient thrombocytosis in the setting of splenectomy and is to follow up with Dr. Lindi Adie next week.   Her diabetes has been monitored with ac/hs CBG checks and SSI was use prn for tighter BS control.  She was transitioned to Augmentin at admission and completed antibiotic course on 10/17.  Pulmonary hygiene was encouraged with use of full flutter valve.  Weights were monitored daily and showed downward trend to 227 pounds.  Her respiratory status is stable and no signs of overload noted.  Lasix was titrated down to 40 mg twice daily however patient has been refused second doses multiple times during her stay. Serial check of lytes showed that acute on chronic renal failure is stable with SCr trending to baseline.   She was found to have episode of urinary retention due to Candida UTI and was treated with course of Diflucan. She is currently voiding without difficulty.  Left-sided low back pain has been managed with use of K pad.    Her blood sugars were monitored on ac/hs basis and have been controlled on diet alone. Hypoxia as well as activity tolerance have improved and she currently requires oxygen intermittently with activity. She was advised to check pulse ox frequently and use oxygen prn.     She has been educated on importance of medical follow-up as well as medication compliance.  Contact-guard assist was recommended for safety and family education attempted  but patient declined this.  She will continue to receive home health PT, OT, social worker as well as Therapist, sports by [place] after discharge.     Rehab course: During patient's stay in rehab weekly team conferences were held to monitor patient's progress, set goals and discuss barriers to discharge. At admission, patient required max to total assist with ADL tasks and total assist with mobility. She  has had improvement in activity tolerance, balance, postural control as well as ability to compensate  for deficits. She requires supervision to min assist for ADL tasks, contact-guard assist with sit to stand transfers and min assist for stand pivot transfers with use of rolling walker.  She refused to participate in dynamic standing balance with ADL tasks.  She requires supervision with verbal cues for transfers.  She is able to propel her wheelchair with supervision.  Family education not completed per patient's refusal.   Discharge disposition: 01-Home or Self Care  Diet: Carb Modified.    Special Instructions: Will need work up for left thyroid nodule Monitor blood sugars 1-2 times a day. 3.  Will need to follow up with GI for work up of cecal wall thickening.    Discharge Instructions     Ambulatory referral to Gastroenterology   Complete by: As directed    Needs follow up/has seen Dr. Carlean Purl   What is the reason for referral?: Colonoscopy   Ambulatory referral to Hematology / Oncology   Complete by: As directed    Ambulatory referral to Pulmonology   Complete by: As directed    Needs follow up/has seen Dr. Shearon Stalls   Reason for referral: Pleural Effusion      Allergies as of 10/24/2020   No Known Allergies      Medication List     STOP taking these medications    amoxicillin-clavulanate 875-125 MG tablet Commonly known as: AUGMENTIN   aspirin EC 81 MG tablet   bethanechol 10 MG tablet Commonly known as: URECHOLINE   metFORMIN 500 MG tablet Commonly known as: GLUCOPHAGE       TAKE these medications    acetaminophen 325 MG tablet Commonly known as: TYLENOL Take 2 tablets (650 mg total) by mouth every 4 (four) hours as needed for headache or mild pain. What changed: reasons to take this   ammonium lactate 12 % lotion Commonly known as: LAC-HYDRIN Apply topically 2 (two) times daily. To dry skin on left ankle   cloNIDine 0.1 mg/24hr patch Commonly known as: CATAPRES - Dosed in mg/24 hr Place 1 patch (0.1 mg total) onto the skin once a week on  Tuesdays. Start taking on: October 28, 2020   Deep Sea Nasal Spray 0.65 % nasal spray Generic drug: sodium chloride Pace 1 spray in each nostril 3 (three) times daily with meals.   ferrous gluconate 324 MG tablet Commonly known as: FERGON Take 1 tablet (324 mg total) by mouth 2 (two) times daily with a meal.   folic acid 1 MG tablet Commonly known as: FOLVITE Take 2 tablets (2 mg total) by mouth daily.   furosemide 40 MG tablet Commonly known as: LASIX Take 1 tablet (40 mg total) by mouth 2 (two) times daily.   gabapentin 400 MG capsule Commonly known as: NEURONTIN Take 1 capsule (400 mg total) by mouth 2 (two) times daily.   isosorbide mononitrate 30 MG 24 hr tablet Commonly known as: IMDUR Take 1.5 tablets (45 mg total) by mouth daily. What changed: how much to take  lidocaine 5 % Commonly known as: LIDODERM Place 1 patch onto the skin at bedtime. Remove & Discard patch within 12 hours or as directed by MD   loperamide 2 MG capsule Commonly known as: IMODIUM Take 1 capsule (2 mg total) by mouth as needed for diarrhea or loose stools.   metoprolol succinate 50 MG 24 hr tablet Commonly known as: TOPROL-XL Take 1 tablet (50 mg total) by mouth daily. Take with or immediately following a meal.   pantoprazole 40 MG tablet Commonly known as: PROTONIX Take 1 tablet (40 mg total) by mouth daily.   polyvinyl alcohol 1.4 % ophthalmic solution Commonly known as: LIQUIFILM TEARS Place 1 drop into both eyes as needed for dry eyes.   Prodigy Autocode Blood Glucose w/Device Kit 1 kit by Does not apply route in the morning, at noon, in the evening, and at bedtime.   rosuvastatin 20 MG tablet Commonly known as: CRESTOR Take 1 tablet (20 mg total) by mouth daily.        Follow-up Information     Raulkar, Clide Deutscher, MD Follow up.   Specialty: Physical Medicine and Rehabilitation Why: 12/08/20 please arrive at 11:00am for 11:20am appointment, thank you! Contact  information: [address] [place] [address] [phone]         Clovia Cuff, MD. Call.   Specialty: Internal Medicine Why: for post hsopital follow up Contact information: [address] [place] [address] [phone]         Gatha Mayer, MD. Call.   Specialty: Gastroenterology Why: for appointment/to schedule colonoscopy for work up of cystic mass Contact information: [place]. [place] [address] [phone]         Stark Klein, MD. Call.   Specialty: General Surgery Why: for post op check/splenectomy Contact information: [address] [place] [address] [phone]         Nicholas Lose, MD. Call on 10/31/2020.   Specialty: Hematology and Oncology Why: be there at 10:50 for labs and the follow up appt. Contact information: [place] [phone]                 Signed: Bary Leriche 10/24/2020, 6:16 PM

## 2020-10-24 NOTE — Progress Notes (Signed)
PROGRESS NOTE   Subjective/Complaints: Asks about how many medications she is taking and if she really needs all these medications. Went through her medications together and discussed rationale for each. Stopped probiotic and changed protonix to PRN  ROS: Denies CP, SOB, N/V/D, +lower extremity swelling  Objective:   No results found. No results for input(s): WBC, HGB, HCT, PLT in the last 72 hours.    Recent Labs    10/22/20 0532 10/23/20 0520  NA 130* 136  K 3.8 4.2  CL 98 102  CO2 24 24  GLUCOSE 124* 117*  BUN 24* 28*  CREATININE 1.20* 1.36*  CALCIUM 8.3* 8.8*     Intake/Output Summary (Last 24 hours) at 10/24/2020 0958 Last data filed at 10/23/2020 1300 Gross per 24 hour  Intake 200 ml  Output --  Net 200 ml        Physical Exam: Vital Signs Blood pressure (!) 145/72, pulse 65, temperature 98.3 F (36.8 C), resp. rate 17, height 5\' 7"  (1.702 m), weight 103 kg, SpO2 98 %. Gen: no distress, normal appearing HEENT: oral mucosa pink and moist, NCAT Cardio: Reg rate Respiratory: Normal effort.  No stridor.  Bilateral clear to auscultation.  + Snyder. GI: Non-distended.  BS +. Skin: Small lesion at splenectomy incision site Psych: Normal mood.  Normal behavior. Musc: +bilateral lower extremity edema. No tenderness in extremities. Neuro: Alert Motor: Grossly 4/5 throughout GU: on bedpan  Assessment/Plan: 1. Functional deficits which require 3+ hours per day of interdisciplinary therapy in a comprehensive inpatient rehab setting. Physiatrist is providing close team supervision and 24 hour management of active medical problems listed below. Physiatrist and rehab team continue to assess barriers to discharge/monitor patient progress toward functional and medical goals  Care Tool:  Bathing    Body parts bathed by patient: Right arm, Left arm, Chest, Abdomen, Front perineal area, Right upper leg, Left upper  leg, Buttocks, Face, Left lower leg   Body parts bathed by helper: Buttocks Body parts n/a: Right lower leg   Bathing assist Assist Level: Set up assist     Upper Body Dressing/Undressing Upper body dressing   What is the patient wearing?: Pull over shirt, Bra    Upper body assist Assist Level: Set up assist    Lower Body Dressing/Undressing Lower body dressing      What is the patient wearing?: Underwear/pull up, Pants     Lower body assist Assist for lower body dressing: Set up assist Assistive Device Comment: threaded pants at EOB with supervision, rolling R<>L needed for donning past waist   Toileting Toileting    Toileting assist Assist for toileting: Minimal Assistance - Patient > 75%     Transfers Chair/bed transfer  Transfers assist     Chair/bed transfer assist level: Supervision/Verbal cueing     Locomotion Ambulation   Ambulation assist   Ambulation activity did not occur: Safety/medical concerns (generalized weakness/deconditioning, decreased balance/postural control)          Walk 10 feet activity   Assist  Walk 10 feet activity did not occur: Safety/medical concerns (generalized weakness/deconditioning, decreased balance/postural control)        Walk 50 feet activity  Assist Walk 50 feet with 2 turns activity did not occur: Safety/medical concerns (generalized weakness/deconditioning, decreased balance/postural control)         Walk 150 feet activity   Assist Walk 150 feet activity did not occur: Safety/medical concerns (generalized weakness/deconditioning, decreased balance/postural control)         Walk 10 feet on uneven surface  activity   Assist Walk 10 feet on uneven surfaces activity did not occur: Safety/medical concerns (generalized weakness/deconditioning, decreased balance/postural control)         Wheelchair     Assist Is the patient using a wheelchair?: Yes Type of Wheelchair: Manual     Wheelchair assist level: Independent Max wheelchair distance: 200    Wheelchair 50 feet with 2 turns activity    Assist        Assist Level: Independent   Wheelchair 150 feet activity     Assist      Assist Level: Independent   Blood pressure (!) 145/72, pulse 65, temperature 98.3 F (36.8 C), resp. rate 17, height 5\' 7"  (1.702 m), weight 103 kg, SpO2 98 %.  Medical Problem List and Plan: 1.  Debility secondary to ITP/s/p splenectomy and old R BKA- hasn't worn her new prosthesis due to fitting issues  D/c home 2.  Impaired mobility: may d/c Lovenox             -antiplatelet therapy: N/A 3. Intermittent RLE pain: d/c oxycodone. Provided with a pain relief journal 4. Mood: LCSW to follow for evaluation and support.              -antipsychotic agents: N/A 5. Neuropsych: This patient is capable of making decisions on her own behalf. 6. Skin/Wound Care: Monitor wound for healing.   7. Fluids/Electrolytes/Nutrition: Monitor I/Os.   Albumin remains low but slightly higher than prior now up to 2.5 8. ITP/pancytopenia/anemia:Improving hold off on aspirin.   Hemoglobin 9.6 on 10/17 9. MRSE bacteremia/HAP: IV antibiotics transitioned to Augmentin thru 10/20/20- completed course.  10. Left pleural effusion s/p thoracotomy 9/19: To follow up with pulmonary after discharge.                   --continue to encourage pulmonary hygiene with flutter valve.  11. Acute on chronic CHF: Decrease Lasix to 40mg  BID.               --monitor I/O. Monitor for signs of overload.             --continue Toprol XL, Lasix  Filed Weights   10/20/20 0418 10/21/20 0541 10/22/20 0357  Weight: 103.9 kg 103.7 kg 103 kg   12. HTN: Monitor BP TID--continue Toprol, Lasix, Imdur. Increase Lasix to 50mg  BID  Increased imdur to 45mg  starting 10/9  D/c amlodipine given leg swelling. Add clonodine patch for BP and pain. Give 1 week before further titration. Vitals:   10/23/20 1952 10/24/20 0515  BP:  137/62 (!) 145/72  Pulse: 65 65  Resp: 18 17  Temp: 98.6 F (37 C) 98.3 F (36.8 C)  SpO2: 99% 98%   13. T2DM: Hgb A1C- 5.5 and well controlled on metformin BID PTA             --on hold due to AKI. Will continue to monitor BS ac/hs and use SSI for now  CBG (last 3)  Recent Labs    10/23/20 1641 10/23/20 2106 10/24/20 0541  GLUCAP 163* 125* 140*    Controlled on 10/20 14. Acute on chronic CKD  III:  Lasix titrated back down to 40mg  BID Creatinine 1.36 on 10/20, monitor outpatient 15. Thyroid nodules: Follow up with [facility] after discharge.  16.  Cecal wall thickening: Will need to contact GI when nearing discharge to determine timing of colonoscopy.  17. Severe obesity BMI 36.95: provide dietary education,  18.  Thrombocytosis will monitor platelets, expect this to remain elevated status post splenectomy  Platelets 46 on 10/10, labs ordered for Monday 19. Urinary retention: UC+ for >100,000 colonies yeast- switched macrobid to diflucan 20. Left sided low back pain: continue kpad. 21. Leukocytosis: resolved, monitor outpatient.  22. Hypoxia: continue oxygen via nasal cannula with goal of 96%.  Patient continues to require on 10/19- wean as tolerated, discussed with her and team that she will likely continue to need at home. 23.  Mild hyponatremia: 130 on 10/19, monitor outpatient 24. GERD; change protonix to PRN   >30 minutes spent in discharge of patient including review of medications and follow-up appointments, physical examination, and in answering all patient's questions   LOS: 22 days A FACE TO FACE EVALUATION WAS PERFORMED  11/19 P Paeton Latouche 10/24/2020, 9:58 AM

## 2020-10-27 DIAGNOSIS — E079 Disorder of thyroid, unspecified: Secondary | ICD-10-CM | POA: Diagnosis not present

## 2020-10-27 DIAGNOSIS — D693 Immune thrombocytopenic purpura: Secondary | ICD-10-CM | POA: Diagnosis not present

## 2020-10-27 DIAGNOSIS — I5033 Acute on chronic diastolic (congestive) heart failure: Secondary | ICD-10-CM | POA: Diagnosis not present

## 2020-10-27 DIAGNOSIS — M19042 Primary osteoarthritis, left hand: Secondary | ICD-10-CM | POA: Diagnosis not present

## 2020-10-27 DIAGNOSIS — L97521 Non-pressure chronic ulcer of other part of left foot limited to breakdown of skin: Secondary | ICD-10-CM | POA: Diagnosis not present

## 2020-10-27 DIAGNOSIS — E1151 Type 2 diabetes mellitus with diabetic peripheral angiopathy without gangrene: Secondary | ICD-10-CM | POA: Diagnosis not present

## 2020-10-27 DIAGNOSIS — J9611 Chronic respiratory failure with hypoxia: Secondary | ICD-10-CM | POA: Diagnosis not present

## 2020-10-27 DIAGNOSIS — M19041 Primary osteoarthritis, right hand: Secondary | ICD-10-CM | POA: Diagnosis not present

## 2020-10-27 DIAGNOSIS — N1832 Chronic kidney disease, stage 3b: Secondary | ICD-10-CM | POA: Diagnosis not present

## 2020-10-27 DIAGNOSIS — D509 Iron deficiency anemia, unspecified: Secondary | ICD-10-CM | POA: Diagnosis not present

## 2020-10-27 DIAGNOSIS — E11621 Type 2 diabetes mellitus with foot ulcer: Secondary | ICD-10-CM | POA: Diagnosis not present

## 2020-10-27 DIAGNOSIS — E1142 Type 2 diabetes mellitus with diabetic polyneuropathy: Secondary | ICD-10-CM | POA: Diagnosis not present

## 2020-10-27 DIAGNOSIS — E1122 Type 2 diabetes mellitus with diabetic chronic kidney disease: Secondary | ICD-10-CM | POA: Diagnosis not present

## 2020-10-27 DIAGNOSIS — I130 Hypertensive heart and chronic kidney disease with heart failure and stage 1 through stage 4 chronic kidney disease, or unspecified chronic kidney disease: Secondary | ICD-10-CM | POA: Diagnosis not present

## 2020-10-27 DIAGNOSIS — D696 Thrombocytopenia, unspecified: Secondary | ICD-10-CM | POA: Diagnosis not present

## 2020-10-27 DIAGNOSIS — D631 Anemia in chronic kidney disease: Secondary | ICD-10-CM | POA: Diagnosis not present

## 2020-10-29 ENCOUNTER — Telehealth: Payer: Self-pay

## 2020-10-29 NOTE — Telephone Encounter (Signed)
Jerilyn from [facility] called to get PT orders for patient. PT 2x/week for 2 weeks and 1x/week for 6 weeks. Verbal orders given

## 2020-10-29 NOTE — Progress Notes (Incomplete)
Patient Care Team: Clovia Cuff, MD as PCP - General (Internal Medicine)  DIAGNOSIS: No diagnosis found.  CHIEF COMPLIANT: Follow-up of recurrent acute ITP  INTERVAL HISTORY: [name] is a 55 y.o. with above-mentioned history of acute ITP who received IVIG treatment along with Nplate. She presents to the clinic today for follow-up.   ALLERGIES:  has No Known Allergies.  MEDICATIONS:  Current Outpatient Medications  Medication Sig Dispense Refill   acetaminophen (TYLENOL) 325 MG tablet Take 2 tablets (650 mg total) by mouth every 4 (four) hours as needed for headache or mild pain.     ammonium lactate (LAC-HYDRIN) 12 % lotion Apply topically 2 (two) times daily. To dry skin on left ankle 400 g 0   Blood Glucose Monitoring Suppl (PRODIGY AUTOCODE BLOOD GLUCOSE) w/Device KIT 1 kit by Does not apply route in the morning, at noon, in the evening, and at bedtime. 1 kit 0   cloNIDine (CATAPRES - DOSED IN MG/24 HR) 0.1 mg/24hr patch Place 1 patch (0.1 mg total) onto the skin once a week on Tuesdays. 4 patch 0   ferrous gluconate (FERGON) 324 MG tablet Take 1 tablet (324 mg total) by mouth 2 (two) times daily with a meal. 60 tablet 0   folic acid (FOLVITE) 1 MG tablet Take 2 tablets (2 mg total) by mouth daily. 60 tablet 0   furosemide (LASIX) 40 MG tablet Take 1 tablet (40 mg total) by mouth 2 (two) times daily. 60 tablet 0   gabapentin (NEURONTIN) 400 MG capsule Take 1 capsule (400 mg total) by mouth 2 (two) times daily. 180 capsule 3   isosorbide mononitrate (IMDUR) 30 MG 24 hr tablet Take 1.5 tablets (45 mg total) by mouth daily. 45 tablet 0   lidocaine (LIDODERM) 5 % Place 1 patch onto the skin at bedtime. Remove & Discard patch within 12 hours or as directed by MD 30 patch 0   loperamide (IMODIUM) 2 MG capsule Take 1 capsule (2 mg total) by mouth as needed for diarrhea or loose stools. 30 capsule 0   metoprolol succinate (TOPROL-XL) 50 MG 24 hr tablet Take 1 tablet (50 mg total)  by mouth daily. Take with or immediately following a meal. 90 tablet 3   pantoprazole (PROTONIX) 40 MG tablet Take 1 tablet (40 mg total) by mouth daily. 30 tablet 0   polyvinyl alcohol (LIQUIFILM TEARS) 1.4 % ophthalmic solution Place 1 drop into both eyes as needed for dry eyes. (Patient not taking: No sig reported)     rosuvastatin (CRESTOR) 20 MG tablet Take 1 tablet (20 mg total) by mouth daily. 90 tablet 3   sodium chloride (OCEAN) 0.65 % nasal spray Pace 1 spray in each nostril 3 (three) times daily with meals. 44 mL 0   No current facility-administered medications for this visit.    PHYSICAL EXAMINATION: ECOG PERFORMANCE STATUS: {CHL ONC ECOG PS:[id number]}  There were no vitals filed for this visit. There were no vitals filed for this visit.  LABORATORY DATA:  I have reviewed the data as listed CMP Latest Ref Rng & Units 10/23/2020 10/22/2020 10/20/2020  Glucose 70 - 99 mg/dL 117(H) 124(H) 106(H)  BUN 6 - 20 mg/dL 28(H) 24(H) 26(H)  Creatinine 0.44 - 1.00 mg/dL 1.36(H) 1.20(H) 1.28(H)  Sodium 135 - 145 mmol/L 136 130(L) 137  Potassium 3.5 - 5.1 mmol/L 4.2 3.8 4.2  Chloride 98 - 111 mmol/L 102 98 104  CO2 22 - 32 mmol/L '24 24 25  ' Calcium 8.9 -  10.3 mg/dL 8.8(L) 8.3(L) 9.0  Total Protein 6.5 - 8.1 g/dL - - -  Total Bilirubin 0.3 - 1.2 mg/dL - - -  Alkaline Phos 38 - 126 U/L - - -  AST 15 - 41 U/L - - -  ALT 0 - 44 U/L - - -    Lab Results  Component Value Date   WBC 8.1 10/20/2020   HGB 9.6 (L) 10/20/2020   HCT 33.2 (L) 10/20/2020   MCV 88.3 10/20/2020   PLT 333 10/20/2020   NEUTROABS 8.1 (H) 10/07/2020    ASSESSMENT & PLAN:  No problem-specific Assessment & Plan notes found for this encounter.    No orders of the defined types were placed in this encounter.  The patient has a good understanding of the overall plan. she agrees with it. she will call with any problems that may develop before the next visit here.  Total time spent: *** mins including face to  face time and time spent for planning, charting and coordination of care  Rulon Eisenmenger, MD, MPH 10/29/2020  I, Thana Ates, am acting as scribe for Dr. Nicholas Lose.  {insert scribe attestation}

## 2020-10-29 NOTE — Telephone Encounter (Signed)
error 

## 2020-10-30 DIAGNOSIS — R059 Cough, unspecified: Secondary | ICD-10-CM | POA: Diagnosis not present

## 2020-10-30 DIAGNOSIS — I509 Heart failure, unspecified: Secondary | ICD-10-CM | POA: Diagnosis not present

## 2020-10-30 DIAGNOSIS — D6959 Other secondary thrombocytopenia: Secondary | ICD-10-CM | POA: Diagnosis not present

## 2020-10-30 DIAGNOSIS — I10 Essential (primary) hypertension: Secondary | ICD-10-CM | POA: Diagnosis not present

## 2020-10-31 ENCOUNTER — Inpatient Hospital Stay: Payer: Medicare Other | Admitting: Hematology and Oncology

## 2020-10-31 ENCOUNTER — Inpatient Hospital Stay: Payer: Medicare Other

## 2020-10-31 NOTE — Assessment & Plan Note (Deleted)
Relapsed Refractory ITP: Relapsing ITP: Patient response to steroids and IVIG but because she is a diabetic we cannot use steroids.Normally responds to IV IG Prior IVIG: March 2021, April 2021, June 2021, Feb 2022 ----------------------------------------------------------------------------------------------------------------------------------------------------------------- Prior treatment:Rituxan weekly x4 started 05/23/2019 (patient missed her treatment on 05/30/2019) (patient missed multiple appointments)  Hospitalization: 06/25/2019-06/29/2019: Nausea and vomiting Hospitalization 08/02/2019-08/09/2019 cellulitis left foot Hospitalization 03/08/2020-03/11/2020: Relapsed ITP and renal failure: Improved with Nplate and IVIG Hospitalization 10/02/2020-10/24/2020: Relapsed ITP in spite of Nplate and IVIG underwent splenectomy 09/18/2020 by Dr. Donell Beers (postoperative fevers treated with antibiotics, pleural effusion tapped 09/22/2020: No malignancy)  08/08/2019: Platelets 301, hemoglobin 8.6 09/05/2019: Platelets 110, hemoglobin 11.5 02/28/2020: Platelets 14, hemoglobin 10.1, creatinine 1.63, potassium 6.3 (this is after 2 doses of IVIG) 03/17/2020:Platelets 74, hemoglobin 9 04/09/2020: Platelets less than 5 (increasing the dosage of Nplate ) 04/17/2020: Platelets 10 (Nplate dosing is being managed by pharmacy): IVIG May 2022 05/08/20: Platelets: 141 05/21/2020: Platelet count 14 10/20/2020: Platelets 333 10/31/2020: Platelets

## 2020-11-05 NOTE — Progress Notes (Deleted)
Synopsis: Referred for follow up of pneumonia and pleural effusions by Clovia Cuff, MD  Subjective:   PATIENT ID: [name] GENDER: female DOB: 11/09/1965, MRN: [id number]  No chief complaint on file.  55yF with history of ITP, DM2, CKD3, diastolic heart failure, PVD s/p R BKA, recent admission for ITP/pancytopenia s/p transfusion, IVIG, IV Fe, EPO, Nplate, steroids and splenectomy 9/15 with course complicated by acute on chronic diastolic heart failure, HAP, left parapneumonic vs sympathetic effusion (s/p thora 9/19 with IR with 500cc drained -- exudative, gram stain and cx negativerelated to splenectomy. Diuresed and finished extended course of augmentin and then discharaged home 10/21after rehab.  Otherwise pertinent review of systems is negative.  Past Medical History:  Diagnosis Date   Acute exacerbation of CHF (congestive heart failure) ([place]) 04/11/2019   Anemia    Arthritis    "spine, hands" (11/25/2015)   Back pain    "all my back; probably 3 times/week" (11/25/2015)   Chronic indwelling Foley catheter 03/04/2017   Diabetic foot ulcer associated with type 2 diabetes mellitus ([place])    Hypertension    Intracerebral hemorrhage ([place]) As a teenager    States she had burst blood vessel as teenager, now with resultant minor visual field and hearing deficits   Neuropathy    Stroke ([place]) ~ 1982   "my feeling on my RLE, right lower eye vision,  & hearing out of my right ear not the same since" (11/25/2015)   Type II diabetes mellitus ([place])      Family History  Problem Relation Age of Onset   Diabetes Mother    Hypertension Mother    Hyperlipidemia Mother    Diabetes Father    Cancer Father        prostate   Hyperlipidemia Father    Hypertension Father    Diabetes Brother    Peripheral Artery Disease Brother        has had toes amputated   Diabetes Son      Past Surgical History:  Procedure Laterality Date   AMPUTATION  11/24/2010   Procedure: AMPUTATION  FOOT;  Surgeon: Wylene Simmer, MD;  Location: [facility];  Service: Orthopedics;  Laterality: Right;  Right  FOOT TRANS METATARSAL AMPUTATION   AMPUTATION  07/02/2011   Procedure: AMPUTATION BELOW KNEE;  Surgeon: Wylene Simmer, MD;  Location: [place];  Service: Orthopedics;  Laterality: Right;   APPLICATION OF WOUND VAC  07/02/2011   Procedure: APPLICATION OF WOUND VAC;  Surgeon: Wylene Simmer, MD;  Location: [place];  Service: Orthopedics;  Laterality: Right;   BRAIN SURGERY  1980's   Previous brain surgery in [place], [place]  2004; 2006   COLONOSCOPY N/A 09/17/2015   Procedure: COLONOSCOPY;  Surgeon: Wilford Corner, MD;  Location: [facility] ENDOSCOPY;  Service: Endoscopy;  Laterality: N/A;   ESOPHAGOGASTRODUODENOSCOPY N/A 09/17/2015   Procedure: ESOPHAGOGASTRODUODENOSCOPY (EGD);  Surgeon: Wilford Corner, MD;  Location[facility] ENDOSCOPY;  Service: Endoscopy;  Laterality: N/A;   I & D EXTREMITY  11/24/2010   Procedure: IRRIGATION AND DEBRIDEMENT EXTREMITY;  Surgeon: Wylene Simmer, MD;  Location: [place];  Service: Orthopedics;  Laterality: Left;  Debriedment of left plantar ulcer and trimming of toenails   I & D EXTREMITY  07/02/2011   Procedure: IRRIGATION AND DEBRIDEMENT EXTREMITY;  Surgeon: Wylene Simmer, MD;  Location: [place];  Service: Orthopedics;  Laterality: Left;  I & D of Left Foot   I & D EXTREMITY  07/06/2011   Procedure: IRRIGATION AND  DEBRIDEMENT EXTREMITY;  Surgeon: Wylene Simmer, MD;  Location: [place];  Service: Orthopedics;  Laterality: Right;  IRRIGATION/DEBRIDEMENT RIGHT BELOW KNEE AMPUTATION   IR THORACENTESIS ASP PLEURAL SPACE W/IMG GUIDE  09/22/2020   LAPAROSCOPIC SPLENECTOMY N/A 09/18/2020   Procedure: HAND ASSISTED LAPAROSCOPIC SPLENECTOMY;  Surgeon: Stark Klein, MD;  Location: [place];  Service: General;  Laterality: N/A;  [place]  2006    Social History   Socioeconomic History   Marital status: Widowed    Spouse name: Not on file   Number of children: 2   Years of  education: Not on file   Highest education level: Some college, no degree  Occupational History   Occupation: disability  Tobacco Use   Smoking status: Never   Smokeless tobacco: Never  Vaping Use   Vaping Use: Never used  Substance and Sexual Activity   Alcohol use: Yes    Alcohol/week: 2.0 standard drinks    Types: 2 Glasses of wine per week   Drug use: No   Sexual activity: Not Currently    Birth control/protection: Post-menopausal  Other Topics Concern   Not on file  Social History Narrative   Widowed in 2012   Lives in a home she owns   No wheelchair ramp, though garage entry is flush to both garage and entry floor.   Unable to use full bathroom upstairs.   Has prosthesis for right LE, but never learned how to use.   Cannot see well due to cataracts.   Did not receive disability in 07/175 with application.   Possibly due to husband's SS she receives   Social Determinants of Radio broadcast assistant Strain: Low Risk    Difficulty of Paying Living Expenses: Not very hard  Food Insecurity: No Food Insecurity   Worried About Charity fundraiser in the Last Year: Never true   Arboriculturist in the Last Year: Never true  Transportation Needs: Public librarian (Medical): No   Lack of Transportation (Non-Medical): Yes  Physical Activity: Not on file  Stress: Not on file  Social Connections: Not on file  Intimate Partner Violence: Not on file     No Known Allergies   Outpatient Medications Prior to Visit  Medication Sig Dispense Refill   acetaminophen (TYLENOL) 325 MG tablet Take 2 tablets (650 mg total) by mouth every 4 (four) hours as needed for headache or mild pain.     ammonium lactate (LAC-HYDRIN) 12 % lotion Apply topically 2 (two) times daily. To dry skin on left ankle 400 g 0   Blood Glucose Monitoring Suppl (PRODIGY AUTOCODE BLOOD GLUCOSE) w/Device KIT 1 kit by Does not apply route in the morning, at noon, in the evening,  and at bedtime. 1 kit 0   cloNIDine (CATAPRES - DOSED IN MG/24 HR) 0.1 mg/24hr patch Place 1 patch (0.1 mg total) onto the skin once a week on Tuesdays. 4 patch 0   ferrous gluconate (FERGON) 324 MG tablet Take 1 tablet (324 mg total) by mouth 2 (two) times daily with a meal. 60 tablet 0   folic acid (FOLVITE) 1 MG tablet Take 2 tablets (2 mg total) by mouth daily. 60 tablet 0   furosemide (LASIX) 40 MG tablet Take 1 tablet (40 mg total) by mouth 2 (two) times daily. 60 tablet 0   gabapentin (NEURONTIN) 400 MG capsule Take 1 capsule (400 mg total) by mouth 2 (two) times daily.  180 capsule 3   isosorbide mononitrate (IMDUR) 30 MG 24 hr tablet Take 1.5 tablets (45 mg total) by mouth daily. 45 tablet 0   lidocaine (LIDODERM) 5 % Place 1 patch onto the skin at bedtime. Remove & Discard patch within 12 hours or as directed by MD 30 patch 0   loperamide (IMODIUM) 2 MG capsule Take 1 capsule (2 mg total) by mouth as needed for diarrhea or loose stools. 30 capsule 0   metoprolol succinate (TOPROL-XL) 50 MG 24 hr tablet Take 1 tablet (50 mg total) by mouth daily. Take with or immediately following a meal. 90 tablet 3   pantoprazole (PROTONIX) 40 MG tablet Take 1 tablet (40 mg total) by mouth daily. 30 tablet 0   polyvinyl alcohol (LIQUIFILM TEARS) 1.4 % ophthalmic solution Place 1 drop into both eyes as needed for dry eyes. (Patient not taking: No sig reported)     rosuvastatin (CRESTOR) 20 MG tablet Take 1 tablet (20 mg total) by mouth daily. 90 tablet 3   sodium chloride (OCEAN) 0.65 % nasal spray Pace 1 spray in each nostril 3 (three) times daily with meals. 44 mL 0   No facility-administered medications prior to visit.       Objective:   Physical Exam:  General appearance: 55 y.o., female, NAD, conversant  Eyes: anicteric sclerae, moist conjunctivae; no lid-lag; PERRL, tracking appropriately HENT: NCAT; oropharynx, MMM, no mucosal ulcerations; normal hard and soft palate Neck: Trachea midline;  no lymphadenopathy, no JVD Lungs: CTAB, no crackles, no wheeze, with normal respiratory effort CV: RRR, no MRGs  Abdomen: Soft, non-tender; non-distended, BS present  Extremities: No peripheral edema, radial and DP pulses present bilaterally  Skin: Normal temperature, turgor and texture; no rash Psych: Appropriate affect Neuro: Alert and oriented to person and place, no focal deficit    There were no vitals filed for this visit.   on *** LPM *** RA BMI Readings from Last 3 Encounters:  10/22/20 35.56 kg/m  10/02/20 36.95 kg/m  06/15/20 36.43 kg/m   Wt Readings from Last 3 Encounters:  10/22/20 227 lb 1.2 oz (103 kg)  10/02/20 235 lb 14.3 oz (107 kg)  06/15/20 232 lb 9.4 oz (105.5 kg)     CBC    Component Value Date/Time   WBC 8.1 10/20/2020 0535   RBC 3.76 (L) 10/20/2020 0535   HGB 9.6 (L) 10/20/2020 0535   HGB 5.8 (LL) 06/12/2020 0755   HGB 12.2 05/16/2019 1522   HGB 10.3 (L) 09/30/2015 1118   HCT 33.2 (L) 10/20/2020 0535   HCT 38.1 05/16/2019 1522   HCT 31.6 (L) 09/30/2015 1118   PLT 333 10/20/2020 0535   PLT <5 (LL) 06/12/2020 0755   PLT 16 (LL) 05/16/2019 1522   MCV 88.3 10/20/2020 0535   MCV 83 05/16/2019 1522   MCV 84.3 09/30/2015 1118   MCH 25.5 (L) 10/20/2020 0535   MCHC 28.9 (L) 10/20/2020 0535   RDW 22.4 (H) 10/20/2020 0535   RDW 16.0 (H) 05/16/2019 1522   RDW 15.1 (H) 09/30/2015 1118   LYMPHSABS 1.5 10/07/2020 0858   LYMPHSABS 1.2 05/16/2019 1522   LYMPHSABS 1.5 09/30/2015 1118   MONOABS 1.7 (H) 10/07/2020 0858   MONOABS 0.3 09/30/2015 1118   EOSABS 0.4 10/07/2020 0858   EOSABS 0.1 05/16/2019 1522   BASOSABS 0.1 10/07/2020 0858   BASOSABS 0.0 05/16/2019 1522   BASOSABS 0.0 09/30/2015 1118    ***  Chest Imaging:  CXR 9/25 reviewed by me with loss  of left diaphragmatic border with LLL atelectasis/consolidation and effusion  Pulmonary Functions Testing Results: No flowsheet data found.   Echocardiogram:   TTE 06/2020:  1. Left  ventricular ejection fraction, by estimation, is 60 to 65%. The  left ventricle has normal function. The left ventricle has no regional  wall motion abnormalities. There is mild concentric left ventricular  hypertrophy. Left ventricular diastolic  parameters are indeterminate. Elevated left ventricular end-diastolic  pressure.   2. Right ventricular systolic function is normal. The right ventricular  size is mildly enlarged.   3. Right atrial size was moderately dilated.   4. The mitral valve is normal in structure. Mild mitral valve  regurgitation. No evidence of mitral stenosis.   5. The aortic valve is normal in structure. Aortic valve regurgitation is  not visualized. No aortic stenosis is present.   6. The inferior vena cava is normal in size with greater than 50%  respiratory variability, suggesting right atrial pressure of 3 mmHg.     Assessment & Plan:   # Exudative effusion # Pneumonia  Plan:      Maryjane Hurter, MD [facility] 11/05/2020 11:26 AM

## 2020-11-06 ENCOUNTER — Inpatient Hospital Stay: Payer: Medicare Other | Admitting: Student

## 2020-11-15 NOTE — Progress Notes (Signed)
Patient Care Team: Clovia Cuff, MD as PCP - General (Internal Medicine)  DIAGNOSIS:    ICD-10-CM   1. Idiopathic thrombocytopenic purpura (ITP) (HCC)  D69.3       CHIEF COMPLIANT: Follow-up of recurrent acute ITP  INTERVAL HISTORY: [name] is a 55 y.o. with above-mentioned history of acute ITP who received IVIG treatment along with Nplate. She presents to the clinic today for follow-up.  She has recovered very well from a hospitalization and month and half in the rehab.  Abdominal wound has healed.  ALLERGIES:  has No Known Allergies.  MEDICATIONS:  Current Outpatient Medications  Medication Sig Dispense Refill   acetaminophen (TYLENOL) 325 MG tablet Take 2 tablets (650 mg total) by mouth every 4 (four) hours as needed for headache or mild pain.     ammonium lactate (LAC-HYDRIN) 12 % lotion Apply topically 2 (two) times daily. To dry skin on left ankle 400 g 0   Blood Glucose Monitoring Suppl (PRODIGY AUTOCODE BLOOD GLUCOSE) w/Device KIT 1 kit by Does not apply route in the morning, at noon, in the evening, and at bedtime. 1 kit 0   cloNIDine (CATAPRES - DOSED IN MG/24 HR) 0.1 mg/24hr patch Place 1 patch (0.1 mg total) onto the skin once a week on Tuesdays. 4 patch 0   ferrous gluconate (FERGON) 324 MG tablet Take 1 tablet (324 mg total) by mouth 2 (two) times daily with a meal. 60 tablet 0   folic acid (FOLVITE) 1 MG tablet Take 2 tablets (2 mg total) by mouth daily. 60 tablet 0   furosemide (LASIX) 40 MG tablet Take 1 tablet (40 mg total) by mouth 2 (two) times daily. 60 tablet 0   gabapentin (NEURONTIN) 400 MG capsule Take 1 capsule (400 mg total) by mouth 2 (two) times daily. 180 capsule 3   isosorbide mononitrate (IMDUR) 30 MG 24 hr tablet Take 1.5 tablets (45 mg total) by mouth daily. 45 tablet 0   lidocaine (LIDODERM) 5 % Place 1 patch onto the skin at bedtime. Remove & Discard patch within 12 hours or as directed by MD 30 patch 0   loperamide (IMODIUM) 2 MG  capsule Take 1 capsule (2 mg total) by mouth as needed for diarrhea or loose stools. 30 capsule 0   metoprolol succinate (TOPROL-XL) 50 MG 24 hr tablet Take 1 tablet (50 mg total) by mouth daily. Take with or immediately following a meal. 90 tablet 3   pantoprazole (PROTONIX) 40 MG tablet Take 1 tablet (40 mg total) by mouth daily. 30 tablet 0   polyvinyl alcohol (LIQUIFILM TEARS) 1.4 % ophthalmic solution Place 1 drop into both eyes as needed for dry eyes. (Patient not taking: No sig reported)     rosuvastatin (CRESTOR) 20 MG tablet Take 1 tablet (20 mg total) by mouth daily. 90 tablet 3   sodium chloride (OCEAN) 0.65 % nasal spray Pace 1 spray in each nostril 3 (three) times daily with meals. 44 mL 0   No current facility-administered medications for this visit.    PHYSICAL EXAMINATION: ECOG PERFORMANCE STATUS: 2 - Symptomatic, <50% confined to bed  Vitals:   11/17/20 1437  BP: (!) 169/77  Pulse: 82  Resp: 17  Temp: 97.7 F (36.5 C)  SpO2: 93%   Filed Weights     LABORATORY DATA:  I have reviewed the data as listed CMP Latest Ref Rng & Units 10/23/2020 10/22/2020 10/20/2020  Glucose 70 - 99 mg/dL 117(H) 124(H) 106(H)  BUN 6 -  20 mg/dL 28(H) 24(H) 26(H)  Creatinine 0.44 - 1.00 mg/dL 1.36(H) 1.20(H) 1.28(H)  Sodium 135 - 145 mmol/L 136 130(L) 137  Potassium 3.5 - 5.1 mmol/L 4.2 3.8 4.2  Chloride 98 - 111 mmol/L 102 98 104  CO2 22 - 32 mmol/L _0 Calcium 8.9 - 10.3 mg/dL 8.8(L) 8.3(L) 9.0  Total Protein 6.5 - 8.1 g/dL - - -  Total Bilirubin 0.3 - 1.2 mg/dL - - -  Alkaline Phos 38 - 126 U/L - - -  AST 15 - 41 U/L - - -  ALT 0 - 44 U/L - - -    Lab Results  Component Value Date   WBC 6.7 11/17/2020   HGB 10.7 (L) 11/17/2020   HCT 35.5 (L) 11/17/2020   MCV 83.7 11/17/2020   PLT 285 11/17/2020   NEUTROABS 4.3 11/17/2020    ASSESSMENT & PLAN:  Idiopathic thrombocytopenic purpura (ITP) (HCC) Relapsed Refractory ITP: Relapsing ITP: Patient response to steroids  and IVIG but because she is a diabetic we cannot use steroids. Normally responds to IV IG Prior IVIG: March 2021, April 2021, June 2021, Feb 2022 ----------------------------------------------------------------------------------------------------------------------------------------------------------------- Prior treatment: Rituxan weekly x4 started 05/23/2019 (patient missed her treatment on 05/30/2019)  (patient missed multiple appointments)   Hospitalization: 06/25/2019-06/29/2019: Nausea and vomiting Hospitalization 08/02/2019-08/09/2019 cellulitis left foot Hospitalization 03/08/2020-03/11/2020: Relapsed ITP and renal failure: Improved with Nplate and IVIG Hospitalization: 09/12/20-10/02/20: Relapsed ITP S/P Splenectomy (sepsis, HCAP) Rehab: 10/02/20-10/24/20   08/08/2019: Platelets 301, hemoglobin 8.6 09/05/2019: Platelets 110, hemoglobin 11.5 02/28/2020: Platelets 14, hemoglobin 10.1, creatinine 1.63, potassium 6.3 (this is after 2 doses of IVIG) 03/17/2020: Platelets 74, hemoglobin 9 04/09/2020: Platelets less than 5 (increasing the dosage of Nplate ) 0/09/2328: Platelets 10 (Nplate dosing is being managed by pharmacy): IVIG May 2022 05/08/20: Platelets: 141 05/21/2020: Platelet count 14 06/06/2020: Platelets 7 10/20/20: Platelets 333 11/17/2020: Platelets 285  Patient has no further issues with ITP. Platelets are settling down into the normal range.  RTC on an as needed basis    No orders of the defined types were placed in this encounter.  The patient has a good understanding of the overall plan. she agrees with it. she will call with any problems that may develop before the next visit here.  Total time spent: 20 mins including face to face time and time spent for planning, charting and coordination of care  Rulon Eisenmenger, MD, MPH 11/17/2020  I, Thana Ates, am acting as scribe for Dr. Nicholas Lose.  I have reviewed the above documentation for accuracy and completeness, and I agree with the  above.

## 2020-11-16 DIAGNOSIS — J9621 Acute and chronic respiratory failure with hypoxia: Secondary | ICD-10-CM | POA: Diagnosis not present

## 2020-11-16 DIAGNOSIS — I5032 Chronic diastolic (congestive) heart failure: Secondary | ICD-10-CM | POA: Diagnosis not present

## 2020-11-17 ENCOUNTER — Other Ambulatory Visit: Payer: Self-pay

## 2020-11-17 ENCOUNTER — Inpatient Hospital Stay: Payer: Medicare Other

## 2020-11-17 ENCOUNTER — Inpatient Hospital Stay: Payer: Medicare Other | Attending: Hematology and Oncology | Admitting: Hematology and Oncology

## 2020-11-17 DIAGNOSIS — D693 Immune thrombocytopenic purpura: Secondary | ICD-10-CM | POA: Insufficient documentation

## 2020-11-17 DIAGNOSIS — G629 Polyneuropathy, unspecified: Secondary | ICD-10-CM | POA: Diagnosis not present

## 2020-11-17 DIAGNOSIS — S88119A Complete traumatic amputation at level between knee and ankle, unspecified lower leg, initial encounter: Secondary | ICD-10-CM | POA: Diagnosis not present

## 2020-11-17 DIAGNOSIS — R5381 Other malaise: Secondary | ICD-10-CM | POA: Diagnosis not present

## 2020-11-17 DIAGNOSIS — E11620 Type 2 diabetes mellitus with diabetic dermatitis: Secondary | ICD-10-CM | POA: Diagnosis not present

## 2020-11-17 LAB — CBC WITH DIFFERENTIAL (CANCER CENTER ONLY)
Abs Immature Granulocytes: 0.04 10*3/uL (ref 0.00–0.07)
Basophils Absolute: 0.1 10*3/uL (ref 0.0–0.1)
Basophils Relative: 1 %
Eosinophils Absolute: 0.1 10*3/uL (ref 0.0–0.5)
Eosinophils Relative: 2 %
HCT: 35.5 % — ABNORMAL LOW (ref 36.0–46.0)
Hemoglobin: 10.7 g/dL — ABNORMAL LOW (ref 12.0–15.0)
Immature Granulocytes: 1 %
Lymphocytes Relative: 20 %
Lymphs Abs: 1.3 10*3/uL (ref 0.7–4.0)
MCH: 25.2 pg — ABNORMAL LOW (ref 26.0–34.0)
MCHC: 30.1 g/dL (ref 30.0–36.0)
MCV: 83.7 fL (ref 80.0–100.0)
Monocytes Absolute: 0.8 10*3/uL (ref 0.1–1.0)
Monocytes Relative: 12 %
Neutro Abs: 4.3 10*3/uL (ref 1.7–7.7)
Neutrophils Relative %: 64 %
Platelet Count: 285 10*3/uL (ref 150–400)
RBC: 4.24 MIL/uL (ref 3.87–5.11)
RDW: 21.7 % — ABNORMAL HIGH (ref 11.5–15.5)
WBC Count: 6.7 10*3/uL (ref 4.0–10.5)
nRBC: 0 % (ref 0.0–0.2)

## 2020-11-17 LAB — CMP (CANCER CENTER ONLY)
ALT: 7 U/L (ref 0–44)
AST: 15 U/L (ref 15–41)
Albumin: 3.9 g/dL (ref 3.5–5.0)
Alkaline Phosphatase: 136 U/L — ABNORMAL HIGH (ref 38–126)
Anion gap: 11 (ref 5–15)
BUN: 24 mg/dL — ABNORMAL HIGH (ref 6–20)
CO2: 21 mmol/L — ABNORMAL LOW (ref 22–32)
Calcium: 8.6 mg/dL — ABNORMAL LOW (ref 8.9–10.3)
Chloride: 110 mmol/L (ref 98–111)
Creatinine: 1.38 mg/dL — ABNORMAL HIGH (ref 0.44–1.00)
GFR, Estimated: 45 mL/min — ABNORMAL LOW (ref >60)
Glucose, Bld: 95 mg/dL (ref 70–99)
Potassium: 5.8 mmol/L — ABNORMAL HIGH (ref 3.5–5.1)
Sodium: 142 mmol/L (ref 135–145)
Total Bilirubin: 0.3 mg/dL (ref 0.3–1.2)
Total Protein: 7.7 g/dL (ref 6.5–8.1)

## 2020-11-17 NOTE — Assessment & Plan Note (Signed)
Relapsed Refractory ITP: Relapsing ITP: Patient response to steroids and IVIG but because she is a diabetic we cannot use steroids.Normally responds to IV IG Prior IVIG: March 2021, April 2021, June 2021, Feb 2022 ----------------------------------------------------------------------------------------------------------------------------------------------------------------- Prior treatment:Rituxan weekly x4 started 05/23/2019 (patient missed her treatment on 05/30/2019) (patient missed multiple appointments)  Hospitalization: 06/25/2019-06/29/2019: Nausea and vomiting Hospitalization 08/02/2019-08/09/2019 cellulitis left foot Hospitalization 03/08/2020-03/11/2020: Relapsed ITP and renal failure: Improved with Nplate and IVIG Hospitalization: 09/12/20-10/02/20: Relapsed ITP S/P Splenectomy (sepsis, HCAP) Rehab: 10/02/20-10/24/20  08/08/2019: Platelets 301, hemoglobin 8.6 09/05/2019: Platelets 110, hemoglobin 11.5 02/28/2020: Platelets 14, hemoglobin 10.1, creatinine 1.63, potassium 6.3 (this is after 2 doses of IVIG) 03/17/2020:Platelets 74, hemoglobin 9 04/09/2020: Platelets less than 5 (increasing the dosage of Nplate ) 04/17/2020: Platelets 10 (Nplate dosing is being managed by pharmacy): IVIG May 2022 05/08/20: Platelets: 141 05/21/2020: Platelet count 14 06/06/2020: Platelets 7 10/20/20: Platelets 333  Patient has no further issues with ITP. RTC on an as needed basis

## 2020-11-26 DIAGNOSIS — D509 Iron deficiency anemia, unspecified: Secondary | ICD-10-CM | POA: Diagnosis not present

## 2020-11-26 DIAGNOSIS — M19041 Primary osteoarthritis, right hand: Secondary | ICD-10-CM | POA: Diagnosis not present

## 2020-11-26 DIAGNOSIS — I130 Hypertensive heart and chronic kidney disease with heart failure and stage 1 through stage 4 chronic kidney disease, or unspecified chronic kidney disease: Secondary | ICD-10-CM | POA: Diagnosis not present

## 2020-11-26 DIAGNOSIS — I5033 Acute on chronic diastolic (congestive) heart failure: Secondary | ICD-10-CM | POA: Diagnosis not present

## 2020-11-26 DIAGNOSIS — E1142 Type 2 diabetes mellitus with diabetic polyneuropathy: Secondary | ICD-10-CM | POA: Diagnosis not present

## 2020-11-26 DIAGNOSIS — N1832 Chronic kidney disease, stage 3b: Secondary | ICD-10-CM | POA: Diagnosis not present

## 2020-11-26 DIAGNOSIS — E079 Disorder of thyroid, unspecified: Secondary | ICD-10-CM | POA: Diagnosis not present

## 2020-11-26 DIAGNOSIS — E1151 Type 2 diabetes mellitus with diabetic peripheral angiopathy without gangrene: Secondary | ICD-10-CM | POA: Diagnosis not present

## 2020-11-26 DIAGNOSIS — D693 Immune thrombocytopenic purpura: Secondary | ICD-10-CM | POA: Diagnosis not present

## 2020-11-26 DIAGNOSIS — E1122 Type 2 diabetes mellitus with diabetic chronic kidney disease: Secondary | ICD-10-CM | POA: Diagnosis not present

## 2020-11-26 DIAGNOSIS — D631 Anemia in chronic kidney disease: Secondary | ICD-10-CM | POA: Diagnosis not present

## 2020-11-26 DIAGNOSIS — D696 Thrombocytopenia, unspecified: Secondary | ICD-10-CM | POA: Diagnosis not present

## 2020-11-26 DIAGNOSIS — M19042 Primary osteoarthritis, left hand: Secondary | ICD-10-CM | POA: Diagnosis not present

## 2020-11-26 DIAGNOSIS — L97521 Non-pressure chronic ulcer of other part of left foot limited to breakdown of skin: Secondary | ICD-10-CM | POA: Diagnosis not present

## 2020-11-26 DIAGNOSIS — E11621 Type 2 diabetes mellitus with foot ulcer: Secondary | ICD-10-CM | POA: Diagnosis not present

## 2020-11-26 DIAGNOSIS — J9611 Chronic respiratory failure with hypoxia: Secondary | ICD-10-CM | POA: Diagnosis not present

## 2020-12-08 ENCOUNTER — Encounter: Payer: Medicare Other | Admitting: Physical Medicine and Rehabilitation

## 2020-12-16 ENCOUNTER — Encounter
Payer: Medicare Other | Attending: Physical Medicine and Rehabilitation | Admitting: Physical Medicine and Rehabilitation

## 2020-12-16 ENCOUNTER — Other Ambulatory Visit: Payer: Self-pay | Admitting: Nurse Practitioner

## 2020-12-16 DIAGNOSIS — I5032 Chronic diastolic (congestive) heart failure: Secondary | ICD-10-CM | POA: Diagnosis not present

## 2020-12-16 DIAGNOSIS — J9621 Acute and chronic respiratory failure with hypoxia: Secondary | ICD-10-CM | POA: Diagnosis not present

## 2020-12-17 DIAGNOSIS — E11620 Type 2 diabetes mellitus with diabetic dermatitis: Secondary | ICD-10-CM | POA: Diagnosis not present

## 2020-12-17 DIAGNOSIS — R5381 Other malaise: Secondary | ICD-10-CM | POA: Diagnosis not present

## 2020-12-17 DIAGNOSIS — G629 Polyneuropathy, unspecified: Secondary | ICD-10-CM | POA: Diagnosis not present

## 2020-12-17 DIAGNOSIS — S88119A Complete traumatic amputation at level between knee and ankle, unspecified lower leg, initial encounter: Secondary | ICD-10-CM | POA: Diagnosis not present

## 2020-12-23 ENCOUNTER — Telehealth: Payer: Self-pay

## 2020-12-23 NOTE — Telephone Encounter (Signed)
[name] called with [facility] for patient. She stated that the patient had an elevated blood pressure of 144/100 today. She stated that she is unable to get the Metoprolol because she cannot afford it. She has not been checking her blood glucose because she does not have strips. She also asked to be discharged from the nursing, but continue PT.

## 2020-12-26 DIAGNOSIS — D693 Immune thrombocytopenic purpura: Secondary | ICD-10-CM | POA: Diagnosis not present

## 2020-12-26 DIAGNOSIS — D509 Iron deficiency anemia, unspecified: Secondary | ICD-10-CM | POA: Diagnosis not present

## 2020-12-26 DIAGNOSIS — N1832 Chronic kidney disease, stage 3b: Secondary | ICD-10-CM | POA: Diagnosis not present

## 2020-12-26 DIAGNOSIS — M19042 Primary osteoarthritis, left hand: Secondary | ICD-10-CM | POA: Diagnosis not present

## 2020-12-26 DIAGNOSIS — J9611 Chronic respiratory failure with hypoxia: Secondary | ICD-10-CM | POA: Diagnosis not present

## 2020-12-26 DIAGNOSIS — M545 Low back pain, unspecified: Secondary | ICD-10-CM | POA: Diagnosis not present

## 2020-12-26 DIAGNOSIS — D631 Anemia in chronic kidney disease: Secondary | ICD-10-CM | POA: Diagnosis not present

## 2020-12-26 DIAGNOSIS — E1142 Type 2 diabetes mellitus with diabetic polyneuropathy: Secondary | ICD-10-CM | POA: Diagnosis not present

## 2020-12-26 DIAGNOSIS — I130 Hypertensive heart and chronic kidney disease with heart failure and stage 1 through stage 4 chronic kidney disease, or unspecified chronic kidney disease: Secondary | ICD-10-CM | POA: Diagnosis not present

## 2020-12-26 DIAGNOSIS — R339 Retention of urine, unspecified: Secondary | ICD-10-CM | POA: Diagnosis not present

## 2020-12-26 DIAGNOSIS — E1122 Type 2 diabetes mellitus with diabetic chronic kidney disease: Secondary | ICD-10-CM | POA: Diagnosis not present

## 2020-12-26 DIAGNOSIS — E079 Disorder of thyroid, unspecified: Secondary | ICD-10-CM | POA: Diagnosis not present

## 2020-12-26 DIAGNOSIS — E1151 Type 2 diabetes mellitus with diabetic peripheral angiopathy without gangrene: Secondary | ICD-10-CM | POA: Diagnosis not present

## 2020-12-26 DIAGNOSIS — M19041 Primary osteoarthritis, right hand: Secondary | ICD-10-CM | POA: Diagnosis not present

## 2020-12-26 DIAGNOSIS — I5032 Chronic diastolic (congestive) heart failure: Secondary | ICD-10-CM | POA: Diagnosis not present

## 2020-12-26 DIAGNOSIS — M479 Spondylosis, unspecified: Secondary | ICD-10-CM | POA: Diagnosis not present

## 2021-01-16 DIAGNOSIS — I5032 Chronic diastolic (congestive) heart failure: Secondary | ICD-10-CM | POA: Diagnosis not present

## 2021-01-16 DIAGNOSIS — J9621 Acute and chronic respiratory failure with hypoxia: Secondary | ICD-10-CM | POA: Diagnosis not present

## 2021-01-25 DIAGNOSIS — J9611 Chronic respiratory failure with hypoxia: Secondary | ICD-10-CM | POA: Diagnosis not present

## 2021-01-25 DIAGNOSIS — I130 Hypertensive heart and chronic kidney disease with heart failure and stage 1 through stage 4 chronic kidney disease, or unspecified chronic kidney disease: Secondary | ICD-10-CM | POA: Diagnosis not present

## 2021-01-25 DIAGNOSIS — N1832 Chronic kidney disease, stage 3b: Secondary | ICD-10-CM | POA: Diagnosis not present

## 2021-01-25 DIAGNOSIS — E1142 Type 2 diabetes mellitus with diabetic polyneuropathy: Secondary | ICD-10-CM | POA: Diagnosis not present

## 2021-01-25 DIAGNOSIS — R339 Retention of urine, unspecified: Secondary | ICD-10-CM | POA: Diagnosis not present

## 2021-01-25 DIAGNOSIS — M545 Low back pain, unspecified: Secondary | ICD-10-CM | POA: Diagnosis not present

## 2021-01-25 DIAGNOSIS — D631 Anemia in chronic kidney disease: Secondary | ICD-10-CM | POA: Diagnosis not present

## 2021-01-25 DIAGNOSIS — E1151 Type 2 diabetes mellitus with diabetic peripheral angiopathy without gangrene: Secondary | ICD-10-CM | POA: Diagnosis not present

## 2021-01-25 DIAGNOSIS — E1122 Type 2 diabetes mellitus with diabetic chronic kidney disease: Secondary | ICD-10-CM | POA: Diagnosis not present

## 2021-01-25 DIAGNOSIS — M19041 Primary osteoarthritis, right hand: Secondary | ICD-10-CM | POA: Diagnosis not present

## 2021-01-25 DIAGNOSIS — D693 Immune thrombocytopenic purpura: Secondary | ICD-10-CM | POA: Diagnosis not present

## 2021-01-25 DIAGNOSIS — M19042 Primary osteoarthritis, left hand: Secondary | ICD-10-CM | POA: Diagnosis not present

## 2021-01-25 DIAGNOSIS — D509 Iron deficiency anemia, unspecified: Secondary | ICD-10-CM | POA: Diagnosis not present

## 2021-01-25 DIAGNOSIS — E079 Disorder of thyroid, unspecified: Secondary | ICD-10-CM | POA: Diagnosis not present

## 2021-01-25 DIAGNOSIS — I5032 Chronic diastolic (congestive) heart failure: Secondary | ICD-10-CM | POA: Diagnosis not present

## 2021-01-25 DIAGNOSIS — M479 Spondylosis, unspecified: Secondary | ICD-10-CM | POA: Diagnosis not present

## 2021-01-30 DIAGNOSIS — F4320 Adjustment disorder, unspecified: Secondary | ICD-10-CM | POA: Diagnosis not present

## 2021-02-10 ENCOUNTER — Encounter: Payer: Self-pay | Admitting: Physical Medicine and Rehabilitation

## 2021-02-16 DIAGNOSIS — I5032 Chronic diastolic (congestive) heart failure: Secondary | ICD-10-CM | POA: Diagnosis not present

## 2021-02-16 DIAGNOSIS — J9621 Acute and chronic respiratory failure with hypoxia: Secondary | ICD-10-CM | POA: Diagnosis not present

## 2021-02-20 DIAGNOSIS — D473 Essential (hemorrhagic) thrombocythemia: Secondary | ICD-10-CM | POA: Diagnosis not present

## 2021-02-20 DIAGNOSIS — I10 Essential (primary) hypertension: Secondary | ICD-10-CM | POA: Diagnosis not present

## 2021-02-20 DIAGNOSIS — E119 Type 2 diabetes mellitus without complications: Secondary | ICD-10-CM | POA: Diagnosis not present

## 2021-02-20 DIAGNOSIS — I509 Heart failure, unspecified: Secondary | ICD-10-CM | POA: Diagnosis not present

## 2021-02-24 DIAGNOSIS — M545 Low back pain, unspecified: Secondary | ICD-10-CM | POA: Diagnosis not present

## 2021-02-24 DIAGNOSIS — I5032 Chronic diastolic (congestive) heart failure: Secondary | ICD-10-CM | POA: Diagnosis not present

## 2021-02-24 DIAGNOSIS — E1122 Type 2 diabetes mellitus with diabetic chronic kidney disease: Secondary | ICD-10-CM | POA: Diagnosis not present

## 2021-02-24 DIAGNOSIS — J9611 Chronic respiratory failure with hypoxia: Secondary | ICD-10-CM | POA: Diagnosis not present

## 2021-02-24 DIAGNOSIS — D509 Iron deficiency anemia, unspecified: Secondary | ICD-10-CM | POA: Diagnosis not present

## 2021-02-24 DIAGNOSIS — M479 Spondylosis, unspecified: Secondary | ICD-10-CM | POA: Diagnosis not present

## 2021-02-24 DIAGNOSIS — D693 Immune thrombocytopenic purpura: Secondary | ICD-10-CM | POA: Diagnosis not present

## 2021-02-24 DIAGNOSIS — I130 Hypertensive heart and chronic kidney disease with heart failure and stage 1 through stage 4 chronic kidney disease, or unspecified chronic kidney disease: Secondary | ICD-10-CM | POA: Diagnosis not present

## 2021-02-24 DIAGNOSIS — E1151 Type 2 diabetes mellitus with diabetic peripheral angiopathy without gangrene: Secondary | ICD-10-CM | POA: Diagnosis not present

## 2021-02-24 DIAGNOSIS — D631 Anemia in chronic kidney disease: Secondary | ICD-10-CM | POA: Diagnosis not present

## 2021-02-24 DIAGNOSIS — N1832 Chronic kidney disease, stage 3b: Secondary | ICD-10-CM | POA: Diagnosis not present

## 2021-02-24 DIAGNOSIS — M19041 Primary osteoarthritis, right hand: Secondary | ICD-10-CM | POA: Diagnosis not present

## 2021-02-24 DIAGNOSIS — E079 Disorder of thyroid, unspecified: Secondary | ICD-10-CM | POA: Diagnosis not present

## 2021-02-24 DIAGNOSIS — E1142 Type 2 diabetes mellitus with diabetic polyneuropathy: Secondary | ICD-10-CM | POA: Diagnosis not present

## 2021-02-24 DIAGNOSIS — R339 Retention of urine, unspecified: Secondary | ICD-10-CM | POA: Diagnosis not present

## 2021-02-24 DIAGNOSIS — M19042 Primary osteoarthritis, left hand: Secondary | ICD-10-CM | POA: Diagnosis not present

## 2021-03-06 DIAGNOSIS — F4320 Adjustment disorder, unspecified: Secondary | ICD-10-CM | POA: Diagnosis not present

## 2021-03-17 DIAGNOSIS — E11620 Type 2 diabetes mellitus with diabetic dermatitis: Secondary | ICD-10-CM | POA: Diagnosis not present

## 2021-03-17 DIAGNOSIS — R5381 Other malaise: Secondary | ICD-10-CM | POA: Diagnosis not present

## 2021-03-17 DIAGNOSIS — S88119A Complete traumatic amputation at level between knee and ankle, unspecified lower leg, initial encounter: Secondary | ICD-10-CM | POA: Diagnosis not present

## 2021-03-17 DIAGNOSIS — G629 Polyneuropathy, unspecified: Secondary | ICD-10-CM | POA: Diagnosis not present

## 2021-03-20 DIAGNOSIS — F4320 Adjustment disorder, unspecified: Secondary | ICD-10-CM | POA: Diagnosis not present

## 2021-03-24 DIAGNOSIS — I509 Heart failure, unspecified: Secondary | ICD-10-CM | POA: Diagnosis not present

## 2021-03-24 DIAGNOSIS — I10 Essential (primary) hypertension: Secondary | ICD-10-CM | POA: Diagnosis not present

## 2021-03-24 DIAGNOSIS — E1165 Type 2 diabetes mellitus with hyperglycemia: Secondary | ICD-10-CM | POA: Diagnosis not present

## 2021-03-24 DIAGNOSIS — Z89511 Acquired absence of right leg below knee: Secondary | ICD-10-CM | POA: Diagnosis not present

## 2021-03-26 DIAGNOSIS — I5032 Chronic diastolic (congestive) heart failure: Secondary | ICD-10-CM | POA: Diagnosis not present

## 2021-03-26 DIAGNOSIS — M545 Low back pain, unspecified: Secondary | ICD-10-CM | POA: Diagnosis not present

## 2021-03-26 DIAGNOSIS — N1832 Chronic kidney disease, stage 3b: Secondary | ICD-10-CM | POA: Diagnosis not present

## 2021-03-26 DIAGNOSIS — E1142 Type 2 diabetes mellitus with diabetic polyneuropathy: Secondary | ICD-10-CM | POA: Diagnosis not present

## 2021-03-26 DIAGNOSIS — J9611 Chronic respiratory failure with hypoxia: Secondary | ICD-10-CM | POA: Diagnosis not present

## 2021-03-26 DIAGNOSIS — I130 Hypertensive heart and chronic kidney disease with heart failure and stage 1 through stage 4 chronic kidney disease, or unspecified chronic kidney disease: Secondary | ICD-10-CM | POA: Diagnosis not present

## 2021-03-26 DIAGNOSIS — M19042 Primary osteoarthritis, left hand: Secondary | ICD-10-CM | POA: Diagnosis not present

## 2021-03-26 DIAGNOSIS — M19041 Primary osteoarthritis, right hand: Secondary | ICD-10-CM | POA: Diagnosis not present

## 2021-03-26 DIAGNOSIS — E1151 Type 2 diabetes mellitus with diabetic peripheral angiopathy without gangrene: Secondary | ICD-10-CM | POA: Diagnosis not present

## 2021-03-26 DIAGNOSIS — E079 Disorder of thyroid, unspecified: Secondary | ICD-10-CM | POA: Diagnosis not present

## 2021-03-26 DIAGNOSIS — R339 Retention of urine, unspecified: Secondary | ICD-10-CM | POA: Diagnosis not present

## 2021-03-26 DIAGNOSIS — D509 Iron deficiency anemia, unspecified: Secondary | ICD-10-CM | POA: Diagnosis not present

## 2021-03-26 DIAGNOSIS — D693 Immune thrombocytopenic purpura: Secondary | ICD-10-CM | POA: Diagnosis not present

## 2021-03-26 DIAGNOSIS — D631 Anemia in chronic kidney disease: Secondary | ICD-10-CM | POA: Diagnosis not present

## 2021-03-26 DIAGNOSIS — E1122 Type 2 diabetes mellitus with diabetic chronic kidney disease: Secondary | ICD-10-CM | POA: Diagnosis not present

## 2021-03-26 DIAGNOSIS — M479 Spondylosis, unspecified: Secondary | ICD-10-CM | POA: Diagnosis not present

## 2021-03-30 ENCOUNTER — Encounter: Payer: Self-pay | Admitting: Hematology and Oncology

## 2021-04-02 DIAGNOSIS — I10 Essential (primary) hypertension: Secondary | ICD-10-CM | POA: Diagnosis not present

## 2021-04-02 DIAGNOSIS — D6959 Other secondary thrombocytopenia: Secondary | ICD-10-CM | POA: Diagnosis not present

## 2021-04-02 DIAGNOSIS — E785 Hyperlipidemia, unspecified: Secondary | ICD-10-CM | POA: Diagnosis not present

## 2021-04-02 DIAGNOSIS — E1165 Type 2 diabetes mellitus with hyperglycemia: Secondary | ICD-10-CM | POA: Diagnosis not present

## 2021-04-17 DIAGNOSIS — S88119A Complete traumatic amputation at level between knee and ankle, unspecified lower leg, initial encounter: Secondary | ICD-10-CM | POA: Diagnosis not present

## 2021-04-17 DIAGNOSIS — G629 Polyneuropathy, unspecified: Secondary | ICD-10-CM | POA: Diagnosis not present

## 2021-04-17 DIAGNOSIS — R5381 Other malaise: Secondary | ICD-10-CM | POA: Diagnosis not present

## 2021-04-17 DIAGNOSIS — E11620 Type 2 diabetes mellitus with diabetic dermatitis: Secondary | ICD-10-CM | POA: Diagnosis not present

## 2021-04-24 ENCOUNTER — Other Ambulatory Visit: Payer: Self-pay | Admitting: Nurse Practitioner

## 2021-04-30 DIAGNOSIS — E1165 Type 2 diabetes mellitus with hyperglycemia: Secondary | ICD-10-CM | POA: Diagnosis not present

## 2021-04-30 DIAGNOSIS — I509 Heart failure, unspecified: Secondary | ICD-10-CM | POA: Diagnosis not present

## 2021-04-30 DIAGNOSIS — I10 Essential (primary) hypertension: Secondary | ICD-10-CM | POA: Diagnosis not present

## 2021-04-30 DIAGNOSIS — Z741 Need for assistance with personal care: Secondary | ICD-10-CM | POA: Diagnosis not present

## 2021-05-17 DIAGNOSIS — E11620 Type 2 diabetes mellitus with diabetic dermatitis: Secondary | ICD-10-CM | POA: Diagnosis not present

## 2021-05-17 DIAGNOSIS — S88119A Complete traumatic amputation at level between knee and ankle, unspecified lower leg, initial encounter: Secondary | ICD-10-CM | POA: Diagnosis not present

## 2021-05-17 DIAGNOSIS — G629 Polyneuropathy, unspecified: Secondary | ICD-10-CM | POA: Diagnosis not present

## 2021-05-17 DIAGNOSIS — R5381 Other malaise: Secondary | ICD-10-CM | POA: Diagnosis not present

## 2021-05-29 DIAGNOSIS — I10 Essential (primary) hypertension: Secondary | ICD-10-CM | POA: Diagnosis not present

## 2021-05-29 DIAGNOSIS — G629 Polyneuropathy, unspecified: Secondary | ICD-10-CM | POA: Diagnosis not present

## 2021-05-29 DIAGNOSIS — I509 Heart failure, unspecified: Secondary | ICD-10-CM | POA: Diagnosis not present

## 2021-05-29 DIAGNOSIS — E1165 Type 2 diabetes mellitus with hyperglycemia: Secondary | ICD-10-CM | POA: Diagnosis not present

## 2021-06-17 DIAGNOSIS — E11620 Type 2 diabetes mellitus with diabetic dermatitis: Secondary | ICD-10-CM | POA: Diagnosis not present

## 2021-06-17 DIAGNOSIS — G629 Polyneuropathy, unspecified: Secondary | ICD-10-CM | POA: Diagnosis not present

## 2021-06-17 DIAGNOSIS — S88119A Complete traumatic amputation at level between knee and ankle, unspecified lower leg, initial encounter: Secondary | ICD-10-CM | POA: Diagnosis not present

## 2021-06-17 DIAGNOSIS — R5381 Other malaise: Secondary | ICD-10-CM | POA: Diagnosis not present

## 2021-07-02 DIAGNOSIS — E1165 Type 2 diabetes mellitus with hyperglycemia: Secondary | ICD-10-CM | POA: Diagnosis not present

## 2021-07-02 DIAGNOSIS — B351 Tinea unguium: Secondary | ICD-10-CM | POA: Diagnosis not present

## 2021-07-02 DIAGNOSIS — I10 Essential (primary) hypertension: Secondary | ICD-10-CM | POA: Diagnosis not present

## 2021-07-02 DIAGNOSIS — R2240 Localized swelling, mass and lump, unspecified lower limb: Secondary | ICD-10-CM | POA: Diagnosis not present

## 2021-07-28 DIAGNOSIS — R2240 Localized swelling, mass and lump, unspecified lower limb: Secondary | ICD-10-CM | POA: Diagnosis not present

## 2021-07-28 DIAGNOSIS — I10 Essential (primary) hypertension: Secondary | ICD-10-CM | POA: Diagnosis not present

## 2021-07-28 DIAGNOSIS — E1165 Type 2 diabetes mellitus with hyperglycemia: Secondary | ICD-10-CM | POA: Diagnosis not present

## 2021-07-28 DIAGNOSIS — R221 Localized swelling, mass and lump, neck: Secondary | ICD-10-CM | POA: Diagnosis not present

## 2021-08-04 DIAGNOSIS — S88119A Complete traumatic amputation at level between knee and ankle, unspecified lower leg, initial encounter: Secondary | ICD-10-CM | POA: Diagnosis not present

## 2021-08-04 DIAGNOSIS — R5381 Other malaise: Secondary | ICD-10-CM | POA: Diagnosis not present

## 2021-08-04 DIAGNOSIS — E11620 Type 2 diabetes mellitus with diabetic dermatitis: Secondary | ICD-10-CM | POA: Diagnosis not present

## 2021-08-04 DIAGNOSIS — G629 Polyneuropathy, unspecified: Secondary | ICD-10-CM | POA: Diagnosis not present

## 2021-08-06 ENCOUNTER — Other Ambulatory Visit: Payer: Self-pay

## 2021-09-04 DIAGNOSIS — S88119A Complete traumatic amputation at level between knee and ankle, unspecified lower leg, initial encounter: Secondary | ICD-10-CM | POA: Diagnosis not present

## 2021-09-04 DIAGNOSIS — E11620 Type 2 diabetes mellitus with diabetic dermatitis: Secondary | ICD-10-CM | POA: Diagnosis not present

## 2021-09-04 DIAGNOSIS — G629 Polyneuropathy, unspecified: Secondary | ICD-10-CM | POA: Diagnosis not present

## 2021-09-04 DIAGNOSIS — R5381 Other malaise: Secondary | ICD-10-CM | POA: Diagnosis not present

## 2021-10-06 ENCOUNTER — Other Ambulatory Visit (HOSPITAL_COMMUNITY): Payer: Self-pay

## 2022-02-04 ENCOUNTER — Encounter: Payer: Self-pay | Admitting: Hematology and Oncology

## 2022-03-09 ENCOUNTER — Encounter: Payer: Self-pay | Admitting: Hematology and Oncology

## 2023-01-19 ENCOUNTER — Encounter (HOSPITAL_COMMUNITY): Payer: Self-pay | Admitting: *Deleted

## 2023-01-19 ENCOUNTER — Encounter: Payer: Self-pay | Admitting: Hematology and Oncology

## 2023-01-19 ENCOUNTER — Ambulatory Visit (HOSPITAL_COMMUNITY)
Admission: EM | Admit: 2023-01-19 | Discharge: 2023-01-19 | Disposition: A | Discharge status: Home / Self Care | Payer: Medicare Other | Attending: Emergency Medicine | Admitting: Emergency Medicine

## 2023-01-19 DIAGNOSIS — L0291 Cutaneous abscess, unspecified: Secondary | ICD-10-CM | POA: Diagnosis not present

## 2023-01-19 LAB — POCT FASTING CBG KUC MANUAL ENTRY: POCT Glucose (KUC): 76 mg/dL (ref 70–99)

## 2023-01-19 MED ORDER — DOXYCYCLINE HYCLATE 100 MG PO CAPS: 100.0000 mg | ORAL_CAPSULE | Freq: Two times a day (BID) | ORAL | 0 refills | Status: DC

## 2023-01-19 NOTE — ED Notes (Signed)
 PT did not want blood work done. Provider aware

## 2023-01-19 NOTE — Discharge Instructions (Signed)
 Call the surgeon's office in the morning to get an appointment arranged. Take the antibiotics as prescribed. Continue your home care of your abscess.   Check your abscess every day for signs that the infection is getting worse. Check for: More redness, swelling, or pain. More fluid or blood. Warmth. More pus or a worse smell.  Go to ER if getting much worse very quickly. Otherwise, follow up with the surgeon.

## 2023-01-19 NOTE — ED Notes (Signed)
POC CBG 76  

## 2023-01-19 NOTE — ED Triage Notes (Signed)
 PT reports a open bleeding wound on Rt buttocks that is swelling toward Rt groin. Pt has a new wound on Lt buttocks.

## 2023-01-20 NOTE — ED Provider Notes (Signed)
[facility]    CSN: [id number] Arrival date & time: 01/19/23  1756      History   Chief Complaint Chief Complaint  Patient presents with   Wound Check    HPI ANEA Wolf is a 58 y.o. female. Pt reports  a small bump on her R buttock 2 months ago; she has been attempting to treat it with peroxide and band aids. Has gotten progressively bigger and more painful and frequently drains blood and pus. She has been caring for it in her half bath, she does not have access to a shower or bathtub. Pt with hx of dm, htn and multiple other health concerns for which she has had no care. Does not have a PCP. Isn't checking blood sugar at home.     Past Medical History:  Diagnosis Date   Acute exacerbation of CHF (congestive heart failure) (HCC) 04/11/2019   Anemia    Arthritis    "spine, hands" (11/25/2015)   Back pain    "all my back; probably 3 times/week" (11/25/2015)   Chronic indwelling Foley catheter 03/04/2017   Diabetic foot ulcer associated with type 2 diabetes mellitus (HCC)    Hypertension    Intracerebral hemorrhage (HCC) As a teenager    States she had burst blood vessel as teenager, now with resultant minor visual field and hearing deficits   Neuropathy    Stroke (HCC) ~ 1982   "my feeling on my RLE, right lower eye vision,  & hearing out of my right ear not the same since" (11/25/2015)   Type II diabetes mellitus (HCC)     Patient Active Problem List   Diagnosis Date Noted   Low back pain 10/24/2020   Colon abnormality--cecal wall thickening 10/24/2020   Hyponatremia    Thrombocytosis    Lobar pneumonia, unspecified organism (HCC)    Bacteremia    Abnormal CT scan, colon    Iron deficiency anemia    Pancytopenia (HCC) 09/12/2020   Chronic respiratory failure with hypoxia (HCC)    Chronic ITP (idiopathic thrombocytopenia) (HCC) 03/08/2020   Acute on chronic respiratory failure with hypoxia (HCC) 03/08/2020   Cellulitis of foot 08/02/2019   Nausea  and vomiting 06/26/2019   Hematuria 06/26/2019   Hypertensive urgency 06/26/2019   Acute on chronic diastolic CHF (congestive heart failure) (HCC)    CHF (congestive heart failure) (HCC) 04/11/2019   Acute exacerbation of CHF (congestive heart failure) (HCC) 04/11/2019   Leukopenia 04/11/2019   History of ITP 04/11/2019   Idiopathic thrombocytopenic purpura (ITP) (HCC) 11/16/2018   Onychomycosis of toenail 10/16/2018   Cataract of both eyes 10/16/2018   Tinea pedis of left foot 10/16/2018   Decreased vision in both eyes 10/16/2018   CKD (chronic kidney disease) stage 3, GFR 30-59 ml/min (HCC)    Cellulitis of left lower extremity 01/25/2018   Hyperkalemia 01/25/2018   Poor compliance 03/08/2017   Hypoxia 03/07/2017   Thyroid nodule 03/07/2017   Chronic indwelling Foley catheter 03/04/2017   Type II diabetes mellitus (HCC)    Femur fracture (HCC) 03/02/2017   Orthostatic dizziness    Candida UTI    Enterococcus faecalis infection    Near syncope 11/25/2015   Dehydration 11/25/2015   Orthostatic hypotension 11/25/2015   UTI (urinary tract infection) 11/25/2015   Acute worsening of stage 3 chronic kidney disease (HCC) 09/24/2015   Diabetes mellitus due to underlying condition with chronic kidney disease, without long-term current use of insulin (HCC)    Fever  Status post below knee amputation of right lower extremity (HCC)    Diabetic peripheral neuropathy (HCC)    Neuropathic pain    Benign essential HTN    Folliculitis    Slow transit constipation    Anemia of chronic disease    Bilateral hydronephrosis    Urinary retention    CKD (chronic kidney disease)    Uncontrolled type 2 diabetes mellitus with diabetic nephropathy, without long-term current use of insulin    Vasculopathy    Debility 09/04/2015   DM type 2 with diabetic peripheral neuropathy (HCC)    S/P BKA (below knee amputation) unilateral (HCC)    Medical non-compliance    Benign skin lesion of multiple  sites    Tachypnea    Acute blood loss anemia    Neurogenic bladder    Occult blood positive stool    Rectovaginal fistula    Controlled diabetes mellitus type 2 with complications (HCC)    AKI (acute kidney injury) (HCC)    Hydronephrosis determined by ultrasound    Papular rash    Uncontrolled type 2 diabetes mellitus with complication    Paresthesia    Thrombocytopenia (HCC) 08/23/2015   Pressure ulcer 08/23/2015   Severe anemia 08/23/2015   Lower urinary tract infectious disease 08/23/2015   Symptomatic anemia 08/23/2015   Peripheral vascular disease, unspecified (HCC) 04/27/2012   Unilateral complete BKA (HCC) 07/09/2011   Gas gangrene (HCC) 07/02/2011   Sepsis (HCC) 07/02/2011   Acute kidney injury superimposed on chronic kidney disease (HCC) 07/02/2011   Diabetes mellitus (HCC) 11/22/2010   Diabetic foot ulcer associated with type 2 diabetes mellitus (HCC)    Essential hypertension    Arthritis    Neuropathy (HCC)    Intracerebral hemorrhage (HCC)     Past Surgical History:  Procedure Laterality Date   AMPUTATION  11/24/2010   Procedure: AMPUTATION FOOT;  Surgeon: [name], MD;  Location: [facility];  Service: Orthopedics;  Laterality: Right;  Right  FOOT TRANS METATARSAL AMPUTATION   AMPUTATION  07/02/2011   Procedure: AMPUTATION BELOW KNEE;  Surgeon: [name], MD;  Location: [facility];  Service: Orthopedics;  Laterality: Right;   APPLICATION OF WOUND VAC  07/02/2011   Procedure: APPLICATION OF WOUND VAC;  Surgeon: [name], MD;  Location: [facility];  Service: Orthopedics;  Laterality: Right;   BRAIN SURGERY  1980's   Previous brain surgery in [place], [place]   CESAREAN SECTION  2004; 2006   COLONOSCOPY N/A 09/17/2015   Procedure: COLONOSCOPY;  Surgeon: Charlott Rakes, MD;  Location: [facility] ENDOSCOPY;  Service: Endoscopy;  Laterality: N/A;   ESOPHAGOGASTRODUODENOSCOPY N/A 09/17/2015   Procedure: ESOPHAGOGASTRODUODENOSCOPY (EGD);  Surgeon: Charlott Rakes, MD;  Location: [facility]  ENDOSCOPY;  Service: Endoscopy;  Laterality: N/A;   I & D EXTREMITY  11/24/2010   Procedure: IRRIGATION AND DEBRIDEMENT EXTREMITY;  Surgeon: [name], MD;  Location: [facility];  Service: Orthopedics;  Laterality: Left;  Debriedment of left plantar ulcer and trimming of toenails   I & D EXTREMITY  07/02/2011   Procedure: IRRIGATION AND DEBRIDEMENT EXTREMITY;  Surgeon: [name], MD;  Location: [facility];  Service: Orthopedics;  Laterality: Left;  I & D of Left Foot   I & D EXTREMITY  07/06/2011   Procedure: IRRIGATION AND DEBRIDEMENT EXTREMITY;  Surgeon: [name], MD;  Location: [facility];  Service: Orthopedics;  Laterality: Right;  IRRIGATION/DEBRIDEMENT RIGHT BELOW KNEE AMPUTATION   IR THORACENTESIS ASP PLEURAL SPACE W/IMG GUIDE  09/22/2020   LAPAROSCOPIC SPLENECTOMY N/A  09/18/2020   Procedure: HAND ASSISTED LAPAROSCOPIC SPLENECTOMY;  Surgeon: Almond Lint, MD;  Location: [facility];  Service: General;  Laterality: N/A;  120 MINUTES   TUBAL LIGATION  2006    OB History   No obstetric history on file.      Home Medications    Prior to Admission medications   Medication Sig Start Date End Date Taking? Authorizing Provider  doxycycline (VIBRAMYCIN) 100 MG capsule Take 1 capsule (100 mg total) by mouth 2 (two) times daily. 01/19/23  Yes Cathlyn Parsons, NP  acetaminophen (TYLENOL) 325 MG tablet Take 2 tablets (650 mg total) by mouth every 4 (four) hours as needed for headache or mild pain. 10/24/20   Love, Evlyn Kanner, PA-C  ammonium lactate (LAC-HYDRIN) 12 % lotion Apply topically 2 (two) times daily. To dry skin on left ankle 10/24/20   Love, Pamela S, PA-C  Blood Glucose Monitoring Suppl (PRODIGY AUTOCODE BLOOD GLUCOSE) w/Device KIT 1 kit by Does not apply route in the morning, at noon, in the evening, and at bedtime. 06/06/19 06/05/20  Barbette Merino, NP  cloNIDine (CATAPRES - DOSED IN MG/24 HR) 0.1 mg/24hr patch Place 1 patch (0.1 mg total) onto the skin once a week on Tuesdays. 10/28/20   Love, Evlyn Kanner,  PA-C  ferrous gluconate (FERGON) 324 MG tablet Take 1 tablet (324 mg total) by mouth 2 (two) times daily with a meal. 10/24/20   Love, Evlyn Kanner, PA-C  folic acid (FOLVITE) 1 MG tablet Take 2 tablets (2 mg total) by mouth daily. 10/24/20   Love, Evlyn Kanner, PA-C  furosemide (LASIX) 40 MG tablet Take 1 tablet (40 mg total) by mouth 2 (two) times daily. 10/24/20   Love, Evlyn Kanner, PA-C  gabapentin (NEURONTIN) 400 MG capsule Take 1 capsule (400 mg total) by mouth 2 (two) times daily. 01/28/20 01/27/21  Barbette Merino, NP  isosorbide mononitrate (IMDUR) 30 MG 24 hr tablet Take 1.5 tablets (45 mg total) by mouth daily. 10/24/20 10/19/21  Love, Evlyn Kanner, PA-C  lidocaine (LIDODERM) 5 % Place 1 patch onto the skin at bedtime. Remove & Discard patch within 12 hours or as directed by MD 10/24/20   Love, Evlyn Kanner, PA-C  loperamide (IMODIUM) 2 MG capsule Take 1 capsule (2 mg total) by mouth as needed for diarrhea or loose stools. 10/02/20   Almon Hercules, MD  metoprolol succinate (TOPROL-XL) 50 MG 24 hr tablet Take 1 tablet (50 mg total) by mouth daily. Take with or immediately following a meal. 01/28/20 01/22/21  Barbette Merino, NP  pantoprazole (PROTONIX) 40 MG tablet Take 1 tablet (40 mg total) by mouth daily. 10/24/20   Love, Evlyn Kanner, PA-C  polyvinyl alcohol (LIQUIFILM TEARS) 1.4 % ophthalmic solution Place 1 drop into both eyes as needed for dry eyes. Patient not taking: No sig reported    [provider]  rosuvastatin (CRESTOR) 20 MG tablet Take 1 tablet (20 mg total) by mouth daily. 01/28/20 01/22/21  Barbette Merino, NP  sodium chloride (OCEAN) 0.65 % nasal spray Pace 1 spray in each nostril 3 (three) times daily with meals. 10/24/20   Love, Evlyn Kanner, PA-C    Family History Family History  Problem Relation Age of Onset   Diabetes Mother    Hypertension Mother    Hyperlipidemia Mother    Diabetes Father    Cancer Father        prostate   Hyperlipidemia Father    Hypertension Father  Diabetes  Brother    Peripheral Artery Disease Brother        has had toes amputated   Diabetes Son     Social History Social History   Tobacco Use   Smoking status: Never   Smokeless tobacco: Never  Vaping Use   Vaping status: Never Used  Substance Use Topics   Alcohol use: Yes    Alcohol/week: 2.0 standard drinks of alcohol    Types: 2 Glasses of wine per week   Drug use: No     Allergies   Patient has no known allergies.   Review of Systems Review of Systems   Physical Exam Triage Vital Signs ED Triage Vitals  Encounter Vitals Group     BP 01/19/23 1941 (!) 150/77     Systolic BP Percentile --      Diastolic BP Percentile --      Pulse Rate 01/19/23 1941 79     Resp 01/19/23 1941 20     Temp 01/19/23 1941 97.9 F (36.6 C)     Temp src --      SpO2 01/19/23 1941 98 %     Weight --      Height --      Head Circumference --      Peak Flow --      Pain Score 01/19/23 1942 10     Pain Loc --      Pain Education --      Exclude from Growth Chart --    No data found.  Updated Vital Signs BP (!) 150/77   Pulse 79   Temp 97.9 F (36.6 C)   Resp 20   LMP  (LMP Unknown)   SpO2 98%   Visual Acuity Right Eye Distance:   Left Eye Distance:   Bilateral Distance:    Right Eye Near:   Left Eye Near:    Bilateral Near:     Physical Exam Constitutional:      Appearance: Normal appearance. She is obese. She is not ill-appearing.  Pulmonary:     Effort: Pulmonary effort is normal.  Genitourinary:   Musculoskeletal:       Legs:  Skin:    Comments: No erythema or warmth in area of concern  Neurological:     Mental Status: She is alert.      UC Treatments / Results  Labs (all labs ordered are listed, but only abnormal results are displayed) Labs Reviewed  CBC WITH DIFFERENTIAL/PLATELET  COMPREHENSIVE METABOLIC PANEL  HEMOGLOBIN A1C  POCT FASTING CBG K[facility] MANUAL ENTRY    EKG   Radiology No results found.  Procedures Procedures (including  critical care time)  Medications Ordered in UC Medications - No data to display  Initial Impression / Assessment and Plan / UC Course  I have reviewed the triage vital signs and the nursing notes.  Pertinent labs & imaging results that were available during my care of the patient were reviewed by me and considered in my medical decision making (see chart for details).    Blood glucose 76. Attempted to obtain updated blood work as pt no longer has pcp and has many health problems - unable to obtain blood after 3 attempts. Pt has mobility issues (R BKA) and is unable to bathe in her home, needs new pcp. Added SDOH and RN case worker referrals, put in for pt to get assistance finding pcp.   Pt's problem appears to be abscess and not cellulitis. Has been gradually  worsening for 2 months. Will start on doxycycline and refer to surgeon for further care.   Final Clinical Impressions(s) / UC Diagnoses   Final diagnoses:  Abscess     Discharge Instructions      Call the surgeon's office in the morning to get an appointment arranged. Take the antibiotics as prescribed. Continue your home care of your abscess.   Check your abscess every day for signs that the infection is getting worse. Check for: More redness, swelling, or pain. More fluid or blood. Warmth. More pus or a worse smell.  Go to ER if getting much worse very quickly. Otherwise, follow up with the surgeon.       ED Prescriptions     Medication Sig Dispense Auth. Provider   doxycycline (VIBRAMYCIN) 100 MG capsule Take 1 capsule (100 mg total) by mouth 2 (two) times daily. 20 capsule Cathlyn Parsons, NP      PDMP not reviewed this encounter.   Cathlyn Parsons, NP 01/20/23 [phone]

## 2023-01-31 ENCOUNTER — Other Ambulatory Visit: Payer: Self-pay | Admitting: Nurse Practitioner

## 2023-01-31 DIAGNOSIS — Z1231 Encounter for screening mammogram for malignant neoplasm of breast: Secondary | ICD-10-CM

## 2023-02-04 ENCOUNTER — Encounter (HOSPITAL_COMMUNITY): Payer: Self-pay | Admitting: *Deleted

## 2023-02-04 ENCOUNTER — Inpatient Hospital Stay (HOSPITAL_COMMUNITY)
Admission: EM | Admit: 2023-02-04 | Discharge: 2023-03-05 | DRG: 286 | Disposition: E | Payer: Medicare Other | Attending: Internal Medicine | Admitting: Internal Medicine

## 2023-02-04 ENCOUNTER — Emergency Department (HOSPITAL_COMMUNITY): Payer: Medicare Other

## 2023-02-04 ENCOUNTER — Other Ambulatory Visit: Payer: Self-pay

## 2023-02-04 DIAGNOSIS — E872 Acidosis, unspecified: Secondary | ICD-10-CM | POA: Diagnosis not present

## 2023-02-04 DIAGNOSIS — I517 Cardiomegaly: Secondary | ICD-10-CM | POA: Diagnosis not present

## 2023-02-04 DIAGNOSIS — E1151 Type 2 diabetes mellitus with diabetic peripheral angiopathy without gangrene: Secondary | ICD-10-CM | POA: Diagnosis present

## 2023-02-04 DIAGNOSIS — N1831 Chronic kidney disease, stage 3a: Secondary | ICD-10-CM | POA: Diagnosis not present

## 2023-02-04 DIAGNOSIS — E871 Hypo-osmolality and hyponatremia: Secondary | ICD-10-CM | POA: Diagnosis present

## 2023-02-04 DIAGNOSIS — R197 Diarrhea, unspecified: Secondary | ICD-10-CM | POA: Diagnosis not present

## 2023-02-04 DIAGNOSIS — D631 Anemia in chronic kidney disease: Secondary | ICD-10-CM | POA: Diagnosis present

## 2023-02-04 DIAGNOSIS — D693 Immune thrombocytopenic purpura: Secondary | ICD-10-CM | POA: Diagnosis not present

## 2023-02-04 DIAGNOSIS — E1142 Type 2 diabetes mellitus with diabetic polyneuropathy: Secondary | ICD-10-CM | POA: Diagnosis present

## 2023-02-04 DIAGNOSIS — I272 Pulmonary hypertension, unspecified: Secondary | ICD-10-CM

## 2023-02-04 DIAGNOSIS — N179 Acute kidney failure, unspecified: Secondary | ICD-10-CM | POA: Diagnosis not present

## 2023-02-04 DIAGNOSIS — E041 Nontoxic single thyroid nodule: Secondary | ICD-10-CM | POA: Diagnosis not present

## 2023-02-04 DIAGNOSIS — E119 Type 2 diabetes mellitus without complications: Secondary | ICD-10-CM

## 2023-02-04 DIAGNOSIS — Z8673 Personal history of transient ischemic attack (TIA), and cerebral infarction without residual deficits: Secondary | ICD-10-CM

## 2023-02-04 DIAGNOSIS — E870 Hyperosmolality and hypernatremia: Secondary | ICD-10-CM | POA: Diagnosis present

## 2023-02-04 DIAGNOSIS — I5043 Acute on chronic combined systolic (congestive) and diastolic (congestive) heart failure: Secondary | ICD-10-CM | POA: Diagnosis not present

## 2023-02-04 DIAGNOSIS — I50810 Right heart failure, unspecified: Secondary | ICD-10-CM | POA: Diagnosis not present

## 2023-02-04 DIAGNOSIS — Z452 Encounter for adjustment and management of vascular access device: Secondary | ICD-10-CM | POA: Diagnosis not present

## 2023-02-04 DIAGNOSIS — I2609 Other pulmonary embolism with acute cor pulmonale: Secondary | ICD-10-CM | POA: Diagnosis present

## 2023-02-04 DIAGNOSIS — G8929 Other chronic pain: Secondary | ICD-10-CM | POA: Diagnosis present

## 2023-02-04 DIAGNOSIS — R0989 Other specified symptoms and signs involving the circulatory and respiratory systems: Secondary | ICD-10-CM | POA: Diagnosis not present

## 2023-02-04 DIAGNOSIS — J9621 Acute and chronic respiratory failure with hypoxia: Secondary | ICD-10-CM | POA: Diagnosis not present

## 2023-02-04 DIAGNOSIS — I110 Hypertensive heart disease with heart failure: Secondary | ICD-10-CM | POA: Diagnosis not present

## 2023-02-04 DIAGNOSIS — Z6835 Body mass index (BMI) 35.0-35.9, adult: Secondary | ICD-10-CM

## 2023-02-04 DIAGNOSIS — I509 Heart failure, unspecified: Secondary | ICD-10-CM | POA: Diagnosis not present

## 2023-02-04 DIAGNOSIS — Z79899 Other long term (current) drug therapy: Secondary | ICD-10-CM

## 2023-02-04 DIAGNOSIS — E66812 Obesity, class 2: Secondary | ICD-10-CM | POA: Diagnosis not present

## 2023-02-04 DIAGNOSIS — I3139 Other pericardial effusion (noninflammatory): Secondary | ICD-10-CM | POA: Diagnosis not present

## 2023-02-04 DIAGNOSIS — Z9081 Acquired absence of spleen: Secondary | ICD-10-CM

## 2023-02-04 DIAGNOSIS — E440 Moderate protein-calorie malnutrition: Secondary | ICD-10-CM | POA: Diagnosis present

## 2023-02-04 DIAGNOSIS — Z89511 Acquired absence of right leg below knee: Secondary | ICD-10-CM

## 2023-02-04 DIAGNOSIS — D638 Anemia in other chronic diseases classified elsewhere: Secondary | ICD-10-CM | POA: Diagnosis not present

## 2023-02-04 DIAGNOSIS — N1832 Chronic kidney disease, stage 3b: Secondary | ICD-10-CM | POA: Diagnosis not present

## 2023-02-04 DIAGNOSIS — I5082 Biventricular heart failure: Secondary | ICD-10-CM | POA: Diagnosis present

## 2023-02-04 DIAGNOSIS — E1122 Type 2 diabetes mellitus with diabetic chronic kidney disease: Secondary | ICD-10-CM | POA: Diagnosis not present

## 2023-02-04 DIAGNOSIS — T501X6A Underdosing of loop [high-ceiling] diuretics, initial encounter: Secondary | ICD-10-CM | POA: Diagnosis present

## 2023-02-04 DIAGNOSIS — L89321 Pressure ulcer of left buttock, stage 1: Secondary | ICD-10-CM | POA: Diagnosis present

## 2023-02-04 DIAGNOSIS — N858 Other specified noninflammatory disorders of uterus: Secondary | ICD-10-CM | POA: Diagnosis not present

## 2023-02-04 DIAGNOSIS — I82431 Acute embolism and thrombosis of right popliteal vein: Secondary | ICD-10-CM | POA: Diagnosis not present

## 2023-02-04 DIAGNOSIS — R519 Headache, unspecified: Secondary | ICD-10-CM | POA: Diagnosis not present

## 2023-02-04 DIAGNOSIS — I5033 Acute on chronic diastolic (congestive) heart failure: Secondary | ICD-10-CM | POA: Diagnosis present

## 2023-02-04 DIAGNOSIS — E876 Hypokalemia: Secondary | ICD-10-CM | POA: Diagnosis not present

## 2023-02-04 DIAGNOSIS — I959 Hypotension, unspecified: Secondary | ICD-10-CM | POA: Diagnosis not present

## 2023-02-04 DIAGNOSIS — J811 Chronic pulmonary edema: Secondary | ICD-10-CM | POA: Diagnosis not present

## 2023-02-04 DIAGNOSIS — D72829 Elevated white blood cell count, unspecified: Secondary | ICD-10-CM | POA: Diagnosis not present

## 2023-02-04 DIAGNOSIS — R059 Cough, unspecified: Secondary | ICD-10-CM | POA: Diagnosis not present

## 2023-02-04 DIAGNOSIS — J810 Acute pulmonary edema: Secondary | ICD-10-CM | POA: Diagnosis not present

## 2023-02-04 DIAGNOSIS — Z23 Encounter for immunization: Secondary | ICD-10-CM

## 2023-02-04 DIAGNOSIS — I130 Hypertensive heart and chronic kidney disease with heart failure and stage 1 through stage 4 chronic kidney disease, or unspecified chronic kidney disease: Secondary | ICD-10-CM | POA: Diagnosis not present

## 2023-02-04 DIAGNOSIS — D649 Anemia, unspecified: Secondary | ICD-10-CM | POA: Diagnosis not present

## 2023-02-04 DIAGNOSIS — J029 Acute pharyngitis, unspecified: Secondary | ICD-10-CM | POA: Diagnosis not present

## 2023-02-04 DIAGNOSIS — I10 Essential (primary) hypertension: Secondary | ICD-10-CM | POA: Insufficient documentation

## 2023-02-04 DIAGNOSIS — R188 Other ascites: Secondary | ICD-10-CM | POA: Diagnosis not present

## 2023-02-04 DIAGNOSIS — I2729 Other secondary pulmonary hypertension: Secondary | ICD-10-CM | POA: Diagnosis not present

## 2023-02-04 DIAGNOSIS — Z993 Dependence on wheelchair: Secondary | ICD-10-CM

## 2023-02-04 DIAGNOSIS — R578 Other shock: Secondary | ICD-10-CM | POA: Diagnosis not present

## 2023-02-04 DIAGNOSIS — N319 Neuromuscular dysfunction of bladder, unspecified: Secondary | ICD-10-CM | POA: Diagnosis present

## 2023-02-04 DIAGNOSIS — Z5982 Transportation insecurity: Secondary | ICD-10-CM

## 2023-02-04 DIAGNOSIS — J90 Pleural effusion, not elsewhere classified: Secondary | ICD-10-CM | POA: Diagnosis not present

## 2023-02-04 DIAGNOSIS — I469 Cardiac arrest, cause unspecified: Secondary | ICD-10-CM | POA: Diagnosis not present

## 2023-02-04 DIAGNOSIS — R918 Other nonspecific abnormal finding of lung field: Secondary | ICD-10-CM | POA: Diagnosis not present

## 2023-02-04 DIAGNOSIS — M159 Polyosteoarthritis, unspecified: Secondary | ICD-10-CM | POA: Diagnosis present

## 2023-02-04 DIAGNOSIS — L8990 Pressure ulcer of unspecified site, unspecified stage: Secondary | ICD-10-CM | POA: Insufficient documentation

## 2023-02-04 DIAGNOSIS — I2489 Other forms of acute ischemic heart disease: Secondary | ICD-10-CM | POA: Diagnosis present

## 2023-02-04 DIAGNOSIS — E875 Hyperkalemia: Secondary | ICD-10-CM | POA: Diagnosis present

## 2023-02-04 DIAGNOSIS — Z833 Family history of diabetes mellitus: Secondary | ICD-10-CM

## 2023-02-04 DIAGNOSIS — J9601 Acute respiratory failure with hypoxia: Secondary | ICD-10-CM | POA: Diagnosis not present

## 2023-02-04 DIAGNOSIS — Z8249 Family history of ischemic heart disease and other diseases of the circulatory system: Secondary | ICD-10-CM

## 2023-02-04 DIAGNOSIS — Z83438 Family history of other disorder of lipoprotein metabolism and other lipidemia: Secondary | ICD-10-CM

## 2023-02-04 DIAGNOSIS — I2699 Other pulmonary embolism without acute cor pulmonale: Secondary | ICD-10-CM

## 2023-02-04 DIAGNOSIS — E559 Vitamin D deficiency, unspecified: Secondary | ICD-10-CM | POA: Diagnosis present

## 2023-02-04 DIAGNOSIS — R531 Weakness: Secondary | ICD-10-CM | POA: Diagnosis present

## 2023-02-04 DIAGNOSIS — E785 Hyperlipidemia, unspecified: Secondary | ICD-10-CM | POA: Diagnosis present

## 2023-02-04 DIAGNOSIS — R609 Edema, unspecified: Secondary | ICD-10-CM | POA: Diagnosis not present

## 2023-02-04 DIAGNOSIS — R509 Fever, unspecified: Secondary | ICD-10-CM | POA: Diagnosis not present

## 2023-02-04 DIAGNOSIS — R109 Unspecified abdominal pain: Secondary | ICD-10-CM | POA: Diagnosis not present

## 2023-02-04 DIAGNOSIS — R159 Full incontinence of feces: Secondary | ICD-10-CM | POA: Diagnosis not present

## 2023-02-04 DIAGNOSIS — M7989 Other specified soft tissue disorders: Secondary | ICD-10-CM | POA: Diagnosis not present

## 2023-02-04 DIAGNOSIS — Z7982 Long term (current) use of aspirin: Secondary | ICD-10-CM

## 2023-02-04 LAB — BASIC METABOLIC PANEL
Anion gap: 11 (ref 5–15)
BUN: 50 mg/dL — ABNORMAL HIGH (ref 6–20)
CO2: 14 mmol/L — ABNORMAL LOW (ref 22–32)
Calcium: 6.4 mg/dL — CL (ref 8.9–10.3)
Chloride: 106 mmol/L (ref 98–111)
Creatinine, Ser: 2.77 mg/dL — ABNORMAL HIGH (ref 0.44–1.00)
GFR, Estimated: 19 mL/min — ABNORMAL LOW (ref >60)
Glucose, Bld: 100 mg/dL — ABNORMAL HIGH (ref 70–99)
Potassium: 5.5 mmol/L — ABNORMAL HIGH (ref 3.5–5.1)
Sodium: 131 mmol/L — ABNORMAL LOW (ref 135–145)

## 2023-02-04 LAB — CBC
HCT: 30.5 % — ABNORMAL LOW (ref 36.0–46.0)
Hemoglobin: 9.8 g/dL — ABNORMAL LOW (ref 12.0–15.0)
MCH: 31.1 pg (ref 26.0–34.0)
MCHC: 32.1 g/dL (ref 30.0–36.0)
MCV: 96.8 fL (ref 80.0–100.0)
Platelets: 212 10*3/uL (ref 150–400)
RBC: 3.15 MIL/uL — ABNORMAL LOW (ref 3.87–5.11)
RDW: 18.6 % — ABNORMAL HIGH (ref 11.5–15.5)
WBC: 8.2 10*3/uL (ref 4.0–10.5)
nRBC: 3.2 % — ABNORMAL HIGH (ref 0.0–0.2)

## 2023-02-04 LAB — MAGNESIUM: Magnesium: 2.4 mg/dL (ref 1.7–2.4)

## 2023-02-04 NOTE — ED Provider Triage Note (Cosign Needed)
Emergency Medicine Provider Triage Evaluation Note  [name] , a 58 y.o. female  was evaluated in triage.  Pt complains of leg swelling.  Review of Systems  Positive:  Negative:   Physical Exam  BP (!) 110/50 (BP Location: Right Arm)   Pulse 66   Temp 98.1 F (36.7 C) (Oral)   Resp 18   Ht 5\' 7"  (1.702 m)   Wt 103 kg   LMP  (LMP Unknown)   SpO2 100%   BMI 35.56 kg/m  Gen:   Awake, no distress   Resp:  Normal effort  MSK:   Moves extremities without difficulty  Other:    Medical Decision Making  Medically screening exam initiated at 7:31 PM.  Appropriate orders placed.  [name] was informed that the remainder of the evaluation will be completed by another provider, this initial triage assessment does not replace that evaluation, and the importance of remaining in the ED until their evaluation is complete.  Worsening of swelling in BL legs x3-4 weeks. No chest pain or SOB. No fever, nausea, vomiting, or diarrhea. States that her water pills are no longer working.   Dorthy Cooler, [place] 02/04/23 [phone]

## 2023-02-04 NOTE — ED Triage Notes (Signed)
The pt has had swelling to both legs for 4-6 weeks  bk amp rt leg

## 2023-02-05 ENCOUNTER — Inpatient Hospital Stay (HOSPITAL_COMMUNITY): Payer: Medicare Other

## 2023-02-05 DIAGNOSIS — I5033 Acute on chronic diastolic (congestive) heart failure: Secondary | ICD-10-CM

## 2023-02-05 DIAGNOSIS — I130 Hypertensive heart and chronic kidney disease with heart failure and stage 1 through stage 4 chronic kidney disease, or unspecified chronic kidney disease: Secondary | ICD-10-CM | POA: Diagnosis present

## 2023-02-05 DIAGNOSIS — I5043 Acute on chronic combined systolic (congestive) and diastolic (congestive) heart failure: Secondary | ICD-10-CM | POA: Diagnosis not present

## 2023-02-05 DIAGNOSIS — E041 Nontoxic single thyroid nodule: Secondary | ICD-10-CM | POA: Diagnosis present

## 2023-02-05 DIAGNOSIS — D693 Immune thrombocytopenic purpura: Secondary | ICD-10-CM | POA: Diagnosis present

## 2023-02-05 DIAGNOSIS — E872 Acidosis, unspecified: Secondary | ICD-10-CM

## 2023-02-05 DIAGNOSIS — E1122 Type 2 diabetes mellitus with diabetic chronic kidney disease: Secondary | ICD-10-CM | POA: Diagnosis present

## 2023-02-05 DIAGNOSIS — I2729 Other secondary pulmonary hypertension: Secondary | ICD-10-CM | POA: Diagnosis present

## 2023-02-05 DIAGNOSIS — I2489 Other forms of acute ischemic heart disease: Secondary | ICD-10-CM | POA: Diagnosis present

## 2023-02-05 DIAGNOSIS — E1151 Type 2 diabetes mellitus with diabetic peripheral angiopathy without gangrene: Secondary | ICD-10-CM | POA: Diagnosis present

## 2023-02-05 DIAGNOSIS — E871 Hypo-osmolality and hyponatremia: Secondary | ICD-10-CM | POA: Diagnosis present

## 2023-02-05 DIAGNOSIS — I959 Hypotension, unspecified: Secondary | ICD-10-CM | POA: Diagnosis not present

## 2023-02-05 DIAGNOSIS — N1831 Chronic kidney disease, stage 3a: Secondary | ICD-10-CM | POA: Diagnosis present

## 2023-02-05 DIAGNOSIS — I82431 Acute embolism and thrombosis of right popliteal vein: Secondary | ICD-10-CM | POA: Diagnosis present

## 2023-02-05 DIAGNOSIS — I272 Pulmonary hypertension, unspecified: Secondary | ICD-10-CM | POA: Diagnosis not present

## 2023-02-05 DIAGNOSIS — J9601 Acute respiratory failure with hypoxia: Secondary | ICD-10-CM | POA: Diagnosis not present

## 2023-02-05 DIAGNOSIS — E1142 Type 2 diabetes mellitus with diabetic polyneuropathy: Secondary | ICD-10-CM | POA: Diagnosis present

## 2023-02-05 DIAGNOSIS — E440 Moderate protein-calorie malnutrition: Secondary | ICD-10-CM | POA: Diagnosis present

## 2023-02-05 DIAGNOSIS — E66812 Obesity, class 2: Secondary | ICD-10-CM | POA: Diagnosis present

## 2023-02-05 DIAGNOSIS — D631 Anemia in chronic kidney disease: Secondary | ICD-10-CM | POA: Diagnosis present

## 2023-02-05 DIAGNOSIS — I3139 Other pericardial effusion (noninflammatory): Secondary | ICD-10-CM | POA: Diagnosis not present

## 2023-02-05 DIAGNOSIS — N1832 Chronic kidney disease, stage 3b: Secondary | ICD-10-CM | POA: Diagnosis not present

## 2023-02-05 DIAGNOSIS — E876 Hypokalemia: Secondary | ICD-10-CM | POA: Diagnosis not present

## 2023-02-05 DIAGNOSIS — I5082 Biventricular heart failure: Secondary | ICD-10-CM | POA: Diagnosis present

## 2023-02-05 DIAGNOSIS — I10 Essential (primary) hypertension: Secondary | ICD-10-CM | POA: Diagnosis not present

## 2023-02-05 DIAGNOSIS — I2609 Other pulmonary embolism with acute cor pulmonale: Secondary | ICD-10-CM | POA: Diagnosis present

## 2023-02-05 DIAGNOSIS — Z89511 Acquired absence of right leg below knee: Secondary | ICD-10-CM | POA: Diagnosis not present

## 2023-02-05 DIAGNOSIS — R609 Edema, unspecified: Secondary | ICD-10-CM | POA: Diagnosis not present

## 2023-02-05 DIAGNOSIS — J9621 Acute and chronic respiratory failure with hypoxia: Secondary | ICD-10-CM | POA: Diagnosis not present

## 2023-02-05 DIAGNOSIS — I469 Cardiac arrest, cause unspecified: Secondary | ICD-10-CM | POA: Diagnosis not present

## 2023-02-05 DIAGNOSIS — R519 Headache, unspecified: Secondary | ICD-10-CM | POA: Diagnosis not present

## 2023-02-05 DIAGNOSIS — R059 Cough, unspecified: Secondary | ICD-10-CM | POA: Diagnosis not present

## 2023-02-05 DIAGNOSIS — R578 Other shock: Secondary | ICD-10-CM | POA: Diagnosis not present

## 2023-02-05 DIAGNOSIS — N179 Acute kidney failure, unspecified: Secondary | ICD-10-CM | POA: Diagnosis present

## 2023-02-05 DIAGNOSIS — Z23 Encounter for immunization: Secondary | ICD-10-CM | POA: Diagnosis not present

## 2023-02-05 DIAGNOSIS — J810 Acute pulmonary edema: Secondary | ICD-10-CM | POA: Diagnosis not present

## 2023-02-05 LAB — COMPREHENSIVE METABOLIC PANEL
ALT: 20 U/L (ref 0–44)
AST: 32 U/L (ref 15–41)
Albumin: 2.9 g/dL — ABNORMAL LOW (ref 3.5–5.0)
Alkaline Phosphatase: 205 U/L — ABNORMAL HIGH (ref 38–126)
Anion gap: 13 (ref 5–15)
BUN: 52 mg/dL — ABNORMAL HIGH (ref 6–20)
CO2: 12 mmol/L — ABNORMAL LOW (ref 22–32)
Calcium: 6.7 mg/dL — ABNORMAL LOW (ref 8.9–10.3)
Chloride: 107 mmol/L (ref 98–111)
Creatinine, Ser: 2.88 mg/dL — ABNORMAL HIGH (ref 0.44–1.00)
GFR, Estimated: 19 mL/min — ABNORMAL LOW (ref >60)
Glucose, Bld: 90 mg/dL (ref 70–99)
Potassium: 5.4 mmol/L — ABNORMAL HIGH (ref 3.5–5.1)
Sodium: 132 mmol/L — ABNORMAL LOW (ref 135–145)
Total Bilirubin: 1.1 mg/dL (ref 0.0–1.2)
Total Protein: 7.3 g/dL (ref 6.5–8.1)

## 2023-02-05 LAB — ECHOCARDIOGRAM COMPLETE
AR max vel: 2.02 cm2
AV Area VTI: 2.52 cm2
AV Area mean vel: 2.09 cm2
AV Mean grad: 8 mm[Hg]
AV Peak grad: 14.9 mm[Hg]
Ao pk vel: 1.93 m/s
Area-P 1/2: 3.34 cm2
Calc EF: 55.8 %
Height: 67 in
MV VTI: 2.76 cm2
S' Lateral: 3.6 cm
Single Plane A2C EF: 43.9 %
Single Plane A4C EF: 67.9 %
Weight: 3633.18 [oz_av]

## 2023-02-05 LAB — CBC
HCT: 29.9 % — ABNORMAL LOW (ref 36.0–46.0)
Hemoglobin: 9.7 g/dL — ABNORMAL LOW (ref 12.0–15.0)
MCH: 31.1 pg (ref 26.0–34.0)
MCHC: 32.4 g/dL (ref 30.0–36.0)
MCV: 95.8 fL (ref 80.0–100.0)
Platelets: 202 10*3/uL (ref 150–400)
RBC: 3.12 MIL/uL — ABNORMAL LOW (ref 3.87–5.11)
RDW: 18.5 % — ABNORMAL HIGH (ref 11.5–15.5)
WBC: 7.6 10*3/uL (ref 4.0–10.5)
nRBC: 3.8 % — ABNORMAL HIGH (ref 0.0–0.2)

## 2023-02-05 LAB — HEMOGLOBIN A1C
Hgb A1c MFr Bld: 5.4 % (ref 4.8–5.6)
Mean Plasma Glucose: 108.28 mg/dL

## 2023-02-05 LAB — C DIFFICILE QUICK SCREEN W PCR REFLEX
C Diff antigen: NEGATIVE
C Diff interpretation: NOT DETECTED
C Diff toxin: NEGATIVE

## 2023-02-05 LAB — TROPONIN I (HIGH SENSITIVITY)
Troponin I (High Sensitivity): 226 ng/L (ref <18)
Troponin I (High Sensitivity): 216 ng/L (ref <18)

## 2023-02-05 LAB — HIV ANTIBODY (ROUTINE TESTING W REFLEX): HIV Screen 4th Generation wRfx: NONREACTIVE

## 2023-02-05 LAB — BRAIN NATRIURETIC PEPTIDE: B Natriuretic Peptide: 1180.9 pg/mL — ABNORMAL HIGH (ref 0.0–100.0)

## 2023-02-05 MED ORDER — SODIUM BICARBONATE 650 MG PO TABS
650.0000 mg | ORAL_TABLET | Freq: Three times a day (TID) | ORAL | Status: DC
Start: 2023-02-05 — End: 2023-02-05

## 2023-02-05 MED ORDER — ISOSORBIDE MONONITRATE ER 30 MG PO TB24
45.0000 mg | ORAL_TABLET | Freq: Every day | ORAL | Status: DC
  Administered 2023-02-05 – 2023-02-11 (×7): 45 mg via ORAL
  Filled 2023-02-05 (×7): qty 2

## 2023-02-05 MED ORDER — GABAPENTIN 400 MG PO CAPS
400.0000 mg | ORAL_CAPSULE | Freq: Two times a day (BID) | ORAL | Status: DC
  Administered 2023-02-05 – 2023-02-14 (×19): 400 mg via ORAL
  Filled 2023-02-05 (×7): qty 1
  Filled 2023-02-05: qty 4
  Filled 2023-02-05 (×11): qty 1

## 2023-02-05 MED ORDER — CALCIUM GLUCONATE-NACL 1-0.675 GM/50ML-% IV SOLN
1.0000 g | Freq: Once | INTRAVENOUS | Status: AC
  Administered 2023-02-05: 1000 mg via INTRAVENOUS
  Filled 2023-02-05: qty 50

## 2023-02-05 MED ORDER — ACETAMINOPHEN 325 MG PO TABS
650.0000 mg | ORAL_TABLET | Freq: Four times a day (QID) | ORAL | Status: DC | PRN
  Administered 2023-02-06 – 2023-02-12 (×6): 650 mg via ORAL
  Filled 2023-02-05 (×7): qty 2

## 2023-02-05 MED ORDER — FUROSEMIDE 10 MG/ML IJ SOLN
40.0000 mg | INTRAMUSCULAR | Status: AC
  Administered 2023-02-05: 40 mg via INTRAVENOUS
  Filled 2023-02-05: qty 4

## 2023-02-05 MED ORDER — LOPERAMIDE HCL 2 MG PO CAPS
2.0000 mg | ORAL_CAPSULE | Freq: Three times a day (TID) | ORAL | Status: DC | PRN
  Administered 2023-02-05: 2 mg via ORAL
  Filled 2023-02-05: qty 1

## 2023-02-05 MED ORDER — SODIUM BICARBONATE 8.4 % IV SOLN
50.0000 meq | Freq: Once | INTRAVENOUS | Status: AC
  Administered 2023-02-05: 50 meq via INTRAVENOUS
  Filled 2023-02-05: qty 50

## 2023-02-05 MED ORDER — ACETAMINOPHEN 650 MG RE SUPP: 650.0000 mg | Freq: Four times a day (QID) | RECTAL | Status: DC | PRN

## 2023-02-05 MED ORDER — SODIUM BICARBONATE 650 MG PO TABS
650.0000 mg | ORAL_TABLET | Freq: Three times a day (TID) | ORAL | Status: DC
  Administered 2023-02-05 – 2023-02-14 (×27): 650 mg via ORAL
  Filled 2023-02-05 (×28): qty 1

## 2023-02-05 MED ORDER — HEPARIN SODIUM (PORCINE) 5000 UNIT/ML IJ SOLN
5000.0000 [IU] | Freq: Three times a day (TID) | INTRAMUSCULAR | Status: DC
  Administered 2023-02-06 – 2023-02-07 (×4): 5000 [IU] via SUBCUTANEOUS
  Filled 2023-02-05 (×5): qty 1

## 2023-02-05 MED ORDER — FUROSEMIDE 10 MG/ML IJ SOLN
40.0000 mg | Freq: Two times a day (BID) | INTRAMUSCULAR | Status: DC
  Administered 2023-02-05 – 2023-02-09 (×9): 40 mg via INTRAVENOUS
  Filled 2023-02-05 (×9): qty 4

## 2023-02-05 MED ORDER — METOPROLOL SUCCINATE ER 50 MG PO TB24
50.0000 mg | ORAL_TABLET | Freq: Every day | ORAL | Status: DC
  Administered 2023-02-05 – 2023-02-10 (×6): 50 mg via ORAL
  Filled 2023-02-05: qty 1
  Filled 2023-02-05: qty 2
  Filled 2023-02-05 (×2): qty 1
  Filled 2023-02-05: qty 2
  Filled 2023-02-05: qty 1

## 2023-02-05 MED ORDER — HEPARIN SODIUM (PORCINE) 5000 UNIT/ML IJ SOLN: 5000.0000 [IU] | Freq: Three times a day (TID) | INTRAMUSCULAR | Status: DC

## 2023-02-05 MED ORDER — SODIUM ZIRCONIUM CYCLOSILICATE 10 G PO PACK
10.0000 g | PACK | Freq: Once | ORAL | Status: AC
  Administered 2023-02-05: 10 g via ORAL
  Filled 2023-02-05: qty 1

## 2023-02-05 NOTE — ED Notes (Signed)
Pt to [place] for ECHO.

## 2023-02-05 NOTE — Progress Notes (Signed)
[facility]                                                                              [name], is a 58 y.o. female, DOB - 09/15/1965, EAV:[id number] Admit date - 02/04/2023    Outpatient Primary MD for the patient is System, Provider Not In  LOS - 0  days  Chief Complaint  Patient presents with   Leg Swelling       Brief summary   Patient is a 58 year old female with ITP, diabetes mellitus type 2, CKD stage IIIa, chronic HFpEF, hypertension, CVA, ICH, PVD, right BKA, neurogenic bladder/chronic Foley presented to ED with bilateral lower extremity edema.  Patient reported 1 week of progressively worsening lower extremity edema up to her thighs.  Reported that it was getting difficult for her to get up from the wheelchair as her legs felt tight.  She had been taking Lasix 40 mg twice daily at home but not having much urine output.  Denied any shortness of breath or cough.  She also had a brief episode of left-sided chest pain.  In ED, hemoglobin 9.8, sodium 131, K5.5, bicarb 14, glucose 100, creatinine 2.7, calcium 6.4, mag 2.4, BNP 1180 Chest x-ray showed cardiomegaly with central congestion, pleural effusion. Received IV Lasix 40 mg and IV calcium gluconate 1 g.   Troponin 226 -  216  Assessment & Plan    Principal Problem:   Acute on chronic heart failure with preserved ejection fraction (HFpEF) (HCC) Elevated troponin likely due to demand ischemia - Echo 06/2020: EF 60 to 65%, mild MVR.  -presented with significant bilateral lower extremity edema up to her thighs.  BNP 1180. Troponin 226 -  216. Cr 2.7, ?cardiorenal syndrome  - follow 2D echo, started on IV lasix 40mg  q12hours  - follow strict I/O's, daily weights  - Cardiology consulted as patient did have chest pain on presentation, troponins elevated  Active Problems:   AKI on CKD stage IIIa, metabolic acidosis, hyperkalemia  - possibly cardiorenal in the setting of acutely decompensated CHF.  -  Cr 1.2 on labs done over 2 years ago, couldn't find recent labs in care everywhere. - CO2 trending down despite 1 amp bicarb. Placed on sod bicarb 650mg  TID.  - Cr 2.8 worsening today   -If no significant improvement, will consult nephrology.     Mild hypervolemic hyponatremia Continue to monitor Na   Hyperkalemia -K5.4, -Lokelma x 1  QT prolongation,  Hypocalcemia Calcium 6.4 on BMP and patient was given IV calcium gluconate in the ED. QTc 522 on EKG.  Magnesium within normal range.  -Corrected calcium albumin 7.6 today   Essential HTN (hypertension) -Continue IV Lasix, Imdur, metoprolol    Diabetes mellitus (HCC), diet controlled, -Hemoglobin A1c 5.4 -Continue follow-up   Chronic normocytic anemia -Baseline hemoglobin 9-10 Hemoglobin stable, continue to monitor labs.  Diarrhea -Reporting multiple episodes of diarrhea -Will check C. difficile, GI pathogen panel   Obesity class II Estimated body mass index is 35.56 kg/m as calculated from the following:   Height as of this encounter: 5\' 7"  (1.702 m).   Weight as of this encounter:  103 kg.  Code Status: Full code DVT Prophylaxis:  heparin injection 5,000 Units Start: 02/05/23 1400   Level of Care: Level of care: Telemetry Cardiac Family Communication: Updated patient Disposition Plan:      Remains inpatient appropriate:      Procedures:    Consultants:   Cardiology  Antimicrobials:   Anti-infectives (From admission, onward)    None          Medications  furosemide  40 mg Intravenous BID   gabapentin  400 mg Oral BID   heparin injection (subcutaneous)  5,000 Units Subcutaneous Q8H   isosorbide mononitrate  45 mg Oral Daily   metoprolol succinate  50 mg Oral Daily      Subjective:   [name] was seen and examined today.  Having multiple episodes of diarrhea.  Currently no chest pain or shortness of breath, however reports worsening lower extremity edema.  No abdominal pain, nausea  vomiting, fevers.   Objective:   Vitals:   02/05/23 0720 02/05/23 0800 02/05/23 0926 02/05/23 0927  BP: (!) 124/59 (!) 144/64  (!) 141/63  Pulse: 69 67  71  Resp: (!) 21 18  20   Temp:   97.9 F (36.6 C)   TempSrc:   Oral   SpO2: 99% 100%  100%  Weight:      Height:        Intake/Output Summary (Last 24 hours) at 02/05/2023 1127 Last data filed at 02/05/2023 0411 Gross per 24 hour  Intake 45.56 ml  Output --  Net 45.56 ml     Wt Readings from Last 3 Encounters:  02/04/23 103 kg  10/22/20 103 kg  10/02/20 107 kg     Exam General: Alert and oriented x 3, NAD Cardiovascular: S1 S2 auscultated,  RRR Respiratory: Breath sound at the bases Gastrointestinal: Soft, nontender, nondistended, + bowel sounds Ext: pitting edema up to thighs, right BKA Neuro: no new deficits Psych: Normal affect     Data Reviewed:  I have personally reviewed following labs    CBC Lab Results  Component Value Date   WBC 7.6 02/05/2023   RBC 3.12 (L) 02/05/2023   HGB 9.7 (L) 02/05/2023   HCT 29.9 (L) 02/05/2023   MCV 95.8 02/05/2023   MCH 31.1 02/05/2023   PLT 202 02/05/2023   MCHC 32.4 02/05/2023   RDW 18.5 (H) 02/05/2023   LYMPHSABS 1.3 11/17/2020   MONOABS 0.8 11/17/2020   EOSABS 0.1 11/17/2020   BASOSABS 0.1 11/17/2020     Last metabolic panel Lab Results  Component Value Date   NA 132 (L) 02/05/2023   K 5.4 (H) 02/05/2023   CL 107 02/05/2023   CO2 12 (L) 02/05/2023   BUN 52 (H) 02/05/2023   CREATININE 2.88 (H) 02/05/2023   GLUCOSE 90 02/05/2023   GFRNONAA 19 (L) 02/05/2023   GFRAA 59 (L) 09/19/2019   CALCIUM 6.7 (L) 02/05/2023   PHOS 2.9 10/01/2020   PROT 7.3 02/05/2023   ALBUMIN 2.9 (L) 02/05/2023   LABGLOB 4.0 05/16/2019   AGRATIO 1.1 (L) 05/16/2019   BILITOT 1.1 02/05/2023   ALKPHOS 205 (H) 02/05/2023   AST 32 02/05/2023   ALT 20 02/05/2023   ANIONGAP 13 02/05/2023    CBG (last 3)  No results for input(s): "GLUCAP" in the last 72 hours.    Coagulation  Profile: No results for input(s): "INR", "PROTIME" in the last 168 hours.   Radiology Studies: I have personally reviewed the imaging studies  US RENAL Result Date:  02/05/2023 CLINICAL DATA:  Acute kidney injury EXAM: RENAL / URINARY TRACT ULTRASOUND COMPLETE COMPARISON:  CT 09/23/2020 FINDINGS: Right Kidney: Renal measurements: 10.3 x 4 x 6.6 = volume: 141 mL. Echogenicity within normal limits. No mass or hydronephrosis visualized. Left Kidney: Renal measurements: 11.7 x 5.2 x 5.7 = volume: 180 mL. Echogenicity within normal limits. No mass or hydronephrosis visualized. Bladder: Appears normal for degree of bladder distention. Other: None. IMPRESSION: Negative. Electronically Signed   By: Corlis Leak M.D.   On: 02/05/2023 08:07   DG Chest Portable 1 View Result Date: 02/04/2023 CLINICAL DATA:  CHF EXAM: PORTABLE CHEST 1 VIEW COMPARISON:  09/28/2020 FINDINGS: Cardiomegaly with mild central congestion. Probable trace pleural effusions. No focal consolidation or pneumothorax. Atelectasis or scarring at the right base IMPRESSION: Cardiomegaly with mild central congestion and probable trace pleural effusions. Electronically Signed   By: Jasmine Pang M.D.   On: 02/04/2023 20:52       Avalee Castrellon M.D. [facility] 02/05/2023, 11:27 AM  Available via Epic secure chat 7am-7pm After 7 pm, please refer to night coverage provider listed on amion.

## 2023-02-05 NOTE — ED Provider Notes (Signed)
[place] EMERGENCY DEPARTMENT AT [facility] Provider Note   CSN: [id number] Arrival date & time: 02/04/23  1751     History  Chief Complaint  Patient presents with   Leg Swelling    [name] is a 58 y.o. female.  The history is provided by the patient and medical records.   58 year old female with history of peripheral neuropathy, CHF, chronic kidney disease, diabetes, presenting to the ED with leg edema.  Patient reports she has had worsening swelling over the past week or so.  Now to the point that she can barely stand and get around.  She does have prior right BKA.  States her legs feel incredibly heavy.  She did briefly run out of her Lasix but resume them over the past few days, however has not had any significant urine output.  She denies any significant shortness of breath.  She is not having any chest pain.  No fever or chills.  Last echo done 2022, EF 60-65% at that time.  Home Medications Prior to Admission medications   Medication Sig Start Date End Date Taking? Authorizing Provider  acetaminophen (TYLENOL) 325 MG tablet Take 2 tablets (650 mg total) by mouth every 4 (four) hours as needed for headache or mild pain. 10/24/20  Yes Love, Evlyn Kanner, PA-C  aspirin EC 81 MG tablet Take 162 mg by mouth daily. Swallow whole.   Yes [provider]  furosemide (LASIX) 40 MG tablet Take 1 tablet (40 mg total) by mouth 2 (two) times daily. 10/24/20  Yes Love, Evlyn Kanner, PA-C  gabapentin (NEURONTIN) 400 MG capsule Take 1 capsule (400 mg total) by mouth 2 (two) times daily. 01/28/20 08/31/23 Yes Barbette Merino, NP  isosorbide mononitrate (IMDUR) 30 MG 24 hr tablet Take 1.5 tablets (45 mg total) by mouth daily. 10/24/20 08/15/23 Yes Love, Evlyn Kanner, PA-C  loperamide (IMODIUM) 2 MG capsule Take 1 capsule (2 mg total) by mouth as needed for diarrhea or loose stools. 10/02/20  Yes Almon Hercules, MD  metoprolol succinate (TOPROL-XL) 50 MG 24 hr tablet Take 1 tablet (50  mg total) by mouth daily. Take with or immediately following a meal. 01/28/20 09/12/23 Yes King, Shana Chute, NP  tetrahydrozoline 0.05 % ophthalmic solution Place 2 drops into both eyes 2 (two) times daily.   Yes [provider]  doxycycline (VIBRAMYCIN) 100 MG capsule Take 1 capsule (100 mg total) by mouth 2 (two) times daily. Patient not taking: Reported on 02/05/2023 01/19/23   Cathlyn Parsons, NP      Allergies    Patient has no known allergies.    Review of Systems   Review of Systems  Cardiovascular:  Positive for leg swelling.  All other systems reviewed and are negative.   Physical Exam Updated Vital Signs BP (!) 124/59 (BP Location: Right Arm)   Pulse 65   Temp (!) 97.4 F (36.3 C) (Oral)   Resp 18   Ht 5\' 7"  (1.702 m)   Wt 103 kg   LMP  (LMP Unknown)   SpO2 100%   BMI 35.56 kg/m  Physical Exam Vitals and nursing note reviewed.  Constitutional:      Appearance: She is well-developed.  HENT:     Head: Normocephalic and atraumatic.  Eyes:     Conjunctiva/sclera: Conjunctivae normal.     Pupils: Pupils are equal, round, and reactive to light.  Cardiovascular:     Rate and Rhythm: Normal rate and regular rhythm.  Heart sounds: Normal heart sounds.  Pulmonary:     Effort: Pulmonary effort is normal.     Breath sounds: Normal breath sounds.  Abdominal:     General: Bowel sounds are normal.     Palpations: Abdomen is soft.  Musculoskeletal:        General: Normal range of motion.     Cervical back: Normal range of motion.     Comments: 3+ pitting edema of left entire leg up to the thigh, right BKA, pitting edema of remaining leg  Skin:    General: Skin is warm and dry.  Neurological:     Mental Status: She is alert and oriented to person, place, and time.     ED Results / Procedures / Treatments   Labs (all labs ordered are listed, but only abnormal results are displayed) Labs Reviewed  BASIC METABOLIC PANEL - Abnormal; Notable for the following  components:      Result Value   Sodium 131 (*)    Potassium 5.5 (*)    CO2 14 (*)    Glucose, Bld 100 (*)    BUN 50 (*)    Creatinine, Ser 2.77 (*)    Calcium 6.4 (*)    GFR, Estimated 19 (*)    All other components within normal limits  CBC - Abnormal; Notable for the following components:   RBC 3.15 (*)    Hemoglobin 9.8 (*)    HCT 30.5 (*)    RDW 18.6 (*)    nRBC 3.2 (*)    All other components within normal limits  BRAIN NATRIURETIC PEPTIDE - Abnormal; Notable for the following components:   B Natriuretic Peptide 1,180.9 (*)    All other components within normal limits  MAGNESIUM    EKG None  Radiology DG Chest Portable 1 View Result Date: 02/04/2023 CLINICAL DATA:  CHF EXAM: PORTABLE CHEST 1 VIEW COMPARISON:  09/28/2020 FINDINGS: Cardiomegaly with mild central congestion. Probable trace pleural effusions. No focal consolidation or pneumothorax. Atelectasis or scarring at the right base IMPRESSION: Cardiomegaly with mild central congestion and probable trace pleural effusions. Electronically Signed   By: Jasmine Pang M.D.   On: 02/04/2023 20:52    Procedures Procedures    Medications Ordered in ED Medications  calcium gluconate 1 g/ 50 mL sodium chloride IVPB (1,000 mg Intravenous New Bag/Given 02/05/23 0251)  furosemide (LASIX) injection 40 mg (40 mg Intravenous Given 02/05/23 0248)    ED Course/ Medical Decision Making/ A&P                                 Medical Decision Making Amount and/or Complexity of Data Reviewed Labs: ordered. Radiology: ordered and independent interpretation performed. ECG/medicine tests: ordered and independent interpretation performed.  Risk Prescription drug management. Decision regarding hospitalization.   58 year old female here with lower extremity edema.  History of CHF.  Off Lasix briefly but resumed over the past week, no significant urine output since then.  She appears fluid overloaded on exam.  She has pitting edema up  to her thighs.  Right BKA.  She is not in any acute respiratory distress, saturating well on room air.  Labs obtained from triage--does have evidence of AKI with serum creatinine 2.77 today, baseline is around 1.3.  Her calcium is also low at 6.4, likely from her worsening renal function.  BNP 1180.  Chest x-ray with some vascular congestion.  She was given IV Lasix  along with some calcium gluconate.  She will require admission.  Has not had an echo in over 2 years.  Discussed with hospitalist, Dr. Loney Loh-- will admit for ongoing care.  Final Clinical Impression(s) / ED Diagnoses Final diagnoses:  Acute on chronic combined systolic and diastolic CHF (congestive heart failure) (HCC)  AKI (acute kidney injury) (HCC)  Hypocalcemia    Rx / DC Orders ED Discharge Orders     None         Garlon Hatchet, PA-C 02/05/23 0359    Gilda Crease, MD 02/05/23 [phone]

## 2023-02-05 NOTE — ED Notes (Signed)
CRITICAL VALUE STICKER  CRITICAL VALUE: trop 226  RECEIVER (on-site recipient of call):I. Amberle Lyter  DATE & TIME NOTIFIED: 02/05/23 0820  MESSENGER (representative from lab):Lab  MD NOTIFIED: Rai  TIME OF NOTIFICATION:0820  RESPONSE:

## 2023-02-05 NOTE — H&P (Addendum)
History and Physical    [name]:[id number] DOB: 10-08-1965 DOA: 02/04/2023  PCP: System, Provider Not In  Patient coming from: Home  Chief Complaint: Bilateral lower extremity edema  HPI: [name] is a 58 y.o. female with medical history significant of ITP, type 2 diabetes, CKD stage IIIa, chronic HFpEF, hypertension, CVA, ICH, PVD, right BKA, neurogenic bladder/chronic Foley presented to the ED with complaint of bilateral lower extremity edema.  Not hypoxic.  CBC notable for hemoglobin 9.8 (at baseline).  BMP showing sodium 131, potassium 5.5, bicarb 14, glucose 100, BUN 50, creatinine 2.7 (previously around 1.2 on labs done over 2 years ago), calcium 6.4.  Magnesium 2.4.  BNP 1180.  Chest x-ray showing cardiomegaly with mild central congestion and probable trace pleural effusions. Patient was given IV Lasix 40 mg and IV calcium gluconate 1 g.  TRH called to admit.  Patient is reporting 1 week history of progressively worsening bilateral lower extremity edema up to her thighs.  It is difficult for her to get up from her wheelchair as her legs feel tight.  Also reporting generalized weakness.  She has been taking Lasix 40 mg twice daily at home but not having much urine output.  She denies shortness of breath or cough.  She reports a brief episode of left-sided chest pain when she initially came into the ED which lasted just for a few seconds and resolved.  No longer having any active chest pain at this time.  No other complaints.  Review of Systems:  Review of Systems  All other systems reviewed and are negative.   Past Medical History:  Diagnosis Date   Acute exacerbation of CHF (congestive heart failure) (HCC) 04/11/2019   Anemia    Arthritis    "spine, hands" (11/25/2015)   Back pain    "all my back; probably 3 times/week" (11/25/2015)   Chronic indwelling Foley catheter 03/04/2017   Diabetic foot ulcer associated with type 2 diabetes mellitus (HCC)     Hypertension    Intracerebral hemorrhage (HCC) As a teenager    States she had burst blood vessel as teenager, now with resultant minor visual field and hearing deficits   Neuropathy    Stroke [facility]) ~ 1982   "my feeling on my RLE, right lower eye vision,  & hearing out of my right ear not the same since" (11/25/2015)   Type II diabetes mellitus (HCC)     Past Surgical History:  Procedure Laterality Date   AMPUTATION  11/24/2010   Procedure: AMPUTATION FOOT;  Surgeon: [name], MD;  Location: [facility] OR;  Service: Orthopedics;  Laterality: Right;  Right  FOOT TRANS METATARSAL AMPUTATION   AMPUTATION  07/02/2011   Procedure: AMPUTATION BELOW KNEE;  Surgeon: [name], MD;  Location: [facility] OR;  Service: Orthopedics;  Laterality: Right;   APPLICATION OF WOUND VAC  07/02/2011   Procedure: APPLICATION OF WOUND VAC;  Surgeon: [name], MD;  Location: [facility] OR;  Service: Orthopedics;  Laterality: Right;   BRAIN SURGERY  1980's   Previous brain surgery in [place], [place]   CESAREAN SECTION  2004; 2006   COLONOSCOPY N/A 09/17/2015   Procedure: COLONOSCOPY;  Surgeon: Charlott Rakes, MD;  Location: [facility] ENDOSCOPY;  Service: Endoscopy;  Laterality: N/A;   ESOPHAGOGASTRODUODENOSCOPY N/A 09/17/2015   Procedure: ESOPHAGOGASTRODUODENOSCOPY (EGD);  Surgeon: Charlott Rakes, MD;  Location: [facility] ENDOSCOPY;  Service: Endoscopy;  Laterality: N/A;   I & D EXTREMITY  11/24/2010   Procedure: IRRIGATION AND DEBRIDEMENT EXTREMITY;  Surgeon: [name], MD;  Location: [facility] OR;  Service: Orthopedics;  Laterality: Left;  Debriedment of left plantar ulcer and trimming of toenails   I & D EXTREMITY  07/02/2011   Procedure: IRRIGATION AND DEBRIDEMENT EXTREMITY;  Surgeon: [name], MD;  Location: [facility] OR;  Service: Orthopedics;  Laterality: Left;  I & D of Left Foot   I & D EXTREMITY  07/06/2011   Procedure: IRRIGATION AND DEBRIDEMENT EXTREMITY;  Surgeon: [name], MD;  Location: [facility] OR;  Service: Orthopedics;  Laterality: Right;   IRRIGATION/DEBRIDEMENT RIGHT BELOW KNEE AMPUTATION   IR THORACENTESIS ASP PLEURAL SPACE W/IMG GUIDE  09/22/2020   LAPAROSCOPIC SPLENECTOMY N/A 09/18/2020   Procedure: HAND ASSISTED LAPAROSCOPIC SPLENECTOMY;  Surgeon: Almond Lint, MD;  Location: [facility] OR;  Service: General;  Laterality: N/A;  120 MINUTES   TUBAL LIGATION  2006     reports that she has never smoked. She has never used smokeless tobacco. She reports current alcohol use of about 2.0 standard drinks of alcohol per week. She reports that she does not use drugs.  No Known Allergies  Family History  Problem Relation Age of Onset   Diabetes Mother    Hypertension Mother    Hyperlipidemia Mother    Diabetes Father    Cancer Father        prostate   Hyperlipidemia Father    Hypertension Father    Diabetes Brother    Peripheral Artery Disease Brother        has had toes amputated   Diabetes Son     Prior to Admission medications   Medication Sig Start Date End Date Taking? Authorizing Provider  acetaminophen (TYLENOL) 325 MG tablet Take 2 tablets (650 mg total) by mouth every 4 (four) hours as needed for headache or mild pain. 10/24/20  Yes Love, Evlyn Kanner, PA-C  aspirin EC 81 MG tablet Take 162 mg by mouth daily. Swallow whole.   Yes [provider]  furosemide (LASIX) 40 MG tablet Take 1 tablet (40 mg total) by mouth 2 (two) times daily. 10/24/20  Yes Love, Evlyn Kanner, PA-C  gabapentin (NEURONTIN) 400 MG capsule Take 1 capsule (400 mg total) by mouth 2 (two) times daily. 01/28/20 08/31/23 Yes Barbette Merino, NP  isosorbide mononitrate (IMDUR) 30 MG 24 hr tablet Take 1.5 tablets (45 mg total) by mouth daily. 10/24/20 08/15/23 Yes Love, Evlyn Kanner, PA-C  loperamide (IMODIUM) 2 MG capsule Take 1 capsule (2 mg total) by mouth as needed for diarrhea or loose stools. 10/02/20  Yes Almon Hercules, MD  metoprolol succinate (TOPROL-XL) 50 MG 24 hr tablet Take 1 tablet (50 mg total) by mouth daily. Take with or immediately following a  meal. 01/28/20 09/12/23 Yes King, Shana Chute, NP  tetrahydrozoline 0.05 % ophthalmic solution Place 2 drops into both eyes 2 (two) times daily.   Yes [provider]  doxycycline (VIBRAMYCIN) 100 MG capsule Take 1 capsule (100 mg total) by mouth 2 (two) times daily. Patient not taking: Reported on 02/05/2023 01/19/23   Cathlyn Parsons, NP    Physical Exam: Vitals:   02/05/23 0412 02/05/23 0413 02/05/23 0414 02/05/23 0414  BP: (!) 155/60   (!) 155/60  Pulse: (!) 163  69 69  Resp: (!) 22  18 18   Temp:    97.8 F (36.6 C)  TempSrc:    Oral  SpO2:  100% 100% 100%  Weight:      Height:        Physical  Exam Vitals reviewed.  Constitutional:      General: She is not in acute distress. HENT:     Head: Normocephalic and atraumatic.  Eyes:     Extraocular Movements: Extraocular movements intact.  Cardiovascular:     Rate and Rhythm: Normal rate and regular rhythm.     Pulses: Normal pulses.  Pulmonary:     Effort: Pulmonary effort is normal. No respiratory distress.     Breath sounds: No wheezing, rhonchi or rales.  Abdominal:     General: Bowel sounds are normal. There is no distension.     Palpations: Abdomen is soft.     Tenderness: There is no abdominal tenderness. There is no guarding.  Musculoskeletal:     Cervical back: Normal range of motion.     Right lower leg: Edema present.     Left lower leg: Edema present.     Comments: Pitting edema of bilateral lower extremities up to thighs.  Right BKA.  Skin:    General: Skin is warm and dry.  Neurological:     General: No focal deficit present.     Mental Status: She is alert and oriented to person, place, and time.     Labs on Admission: I have personally reviewed following labs and imaging studies  CBC: Recent Labs  Lab 02/04/23 2000  WBC 8.2  HGB 9.8*  HCT 30.5*  MCV 96.8  PLT 212   Basic Metabolic Panel: Recent Labs  Lab 02/04/23 2000  NA 131*  K 5.5*  CL 106  CO2 14*  GLUCOSE 100*  BUN 50*   CREATININE 2.77*  CALCIUM 6.4*  MG 2.4   GFR: Estimated Creatinine Clearance: 28 mL/min (A) (by C-G formula based on SCr of 2.77 mg/dL (H)). Liver Function Tests: No results for input(s): "AST", "ALT", "ALKPHOS", "BILITOT", "PROT", "ALBUMIN" in the last 168 hours. No results for input(s): "LIPASE", "AMYLASE" in the last 168 hours. No results for input(s): "AMMONIA" in the last 168 hours. Coagulation Profile: No results for input(s): "INR", "PROTIME" in the last 168 hours. Cardiac Enzymes: No results for input(s): "CKTOTAL", "CKMB", "CKMBINDEX", "TROPONINI" in the last 168 hours. BNP (last 3 results) No results for input(s): "PROBNP" in the last 8760 hours. HbA1C: No results for input(s): "HGBA1C" in the last 72 hours. CBG: No results for input(s): "GLUCAP" in the last 168 hours. Lipid Profile: No results for input(s): "CHOL", "HDL", "LDLCALC", "TRIG", "CHOLHDL", "LDLDIRECT" in the last 72 hours. Thyroid Function Tests: No results for input(s): "TSH", "T4TOTAL", "FREET4", "T3FREE", "THYROIDAB" in the last 72 hours. Anemia Panel: No results for input(s): "VITAMINB12", "FOLATE", "FERRITIN", "TIBC", "IRON", "RETICCTPCT" in the last 72 hours. Urine analysis:    Component Value Date/Time   COLORURINE YELLOW 10/07/2020 1614   APPEARANCEUR CLEAR 10/07/2020 1614   LABSPEC 1.009 10/07/2020 1614   PHURINE 6.0 10/07/2020 1614   GLUCOSEU NEGATIVE 10/07/2020 1614   HGBUR NEGATIVE 10/07/2020 1614   BILIRUBINUR NEGATIVE 10/07/2020 1614   KETONESUR NEGATIVE 10/07/2020 1614   PROTEINUR 30 (A) 10/07/2020 1614   UROBILINOGEN 2.0 (H) 07/08/2011 0051   NITRITE NEGATIVE 10/07/2020 1614   LEUKOCYTESUR SMALL (A) 10/07/2020 1614    Radiological Exams on Admission: DG Chest Portable 1 View Result Date: 02/04/2023 CLINICAL DATA:  CHF EXAM: PORTABLE CHEST 1 VIEW COMPARISON:  09/28/2020 FINDINGS: Cardiomegaly with mild central congestion. Probable trace pleural effusions. No focal consolidation or  pneumothorax. Atelectasis or scarring at the right base IMPRESSION: Cardiomegaly with mild central congestion and probable trace pleural effusions.  Electronically Signed   By: Jasmine Pang M.D.   On: 02/04/2023 20:52    EKG: Independently reviewed.  Sinus rhythm, QTc 522, new T wave abnormality in leads I and aVL compared to previous EKG from September 2022.  Assessment and Plan  Acute on chronic HFpEF Last echo done in June 2022 showing EF 60 to 65%, mild MVR.  Patient is presenting with significant bilateral lower extremity edema up to her thighs.  BNP 1180.  Chest x-ray showing cardiomegaly with mild central congestion and probable trace pleural effusions.  Patient is not hypoxic.  Continue diuresis with IV Lasix 40 mg twice daily.  Repeat echocardiogram ordered.  Monitor intake and output, daily weights, and low-sodium diet with fluid restriction.  AKI on CKD stage IIIa Possibly cardiorenal in the setting of acutely decompensated CHF.  Creatinine currently 2.7 and was previously around 1.2 on labs done over 2 years ago.  Continue diuresis with IV Lasix and monitor renal function closely.  Avoid nephrotoxic agents.  Renal ultrasound ordered.  Metabolic acidosis In the setting of AKI on CKD.  Bicarb supplement given, continue to monitor labs.  Mild hyperkalemia In the setting of AKI on CKD.  IV Lasix given, continue to monitor labs.  Mild hypervolemic hyponatremia Continue to monitor sodium level.  QT prolongation Hypocalcemia? Calcium 6.4 on BMP and patient was given IV calcium gluconate in the ED.  CMP ordered to check calcium and albumin level.  QTc 522 on EKG.  Magnesium within normal range.  Avoid QT prolonging drugs and follow-up repeat EKG in the morning.  Chest pain and acute EKG changes Patient reports a brief episode of left-sided chest pain when she initially came into the ED which lasted only for a few seconds.  She denies active chest pain at this time and resting  comfortably.  EKG with new T wave abnormality in leads I and aVL.  Checking troponin x 2.  Echocardiogram ordered as mentioned previously.  ITP Platelet count stable, continue to monitor labs.  Diet controlled type 2 diabetes Last A1c 5.5 in September 2022, repeat ordered.  Hypertension Continue IV Lasix and home Imdur and metoprolol.  Chronic normocytic anemia Hemoglobin stable, continue to monitor labs.  DVT prophylaxis: Subcutaneous heparin Code Status: Full Code (discussed with the patient) Level of care: Telemetry bed Admission status: It is my clinical opinion that admission to INPATIENT is reasonable and necessary because of the expectation that this patient will require hospital care that crosses at least 2 midnights to treat this condition based on the medical complexity of the problems presented.  Given the aforementioned information, the predictability of an adverse outcome is felt to be significant.  John Giovanni MD [facility]  If 7PM-7AM, please contact night-coverage [email or web address]  02/05/2023, 5:40 AM

## 2023-02-05 NOTE — ED Notes (Signed)
 Assumed pt care.

## 2023-02-05 NOTE — ED Notes (Addendum)
Pt refusing to keep blood pressure cuff in place as well as pulse ox. Attempted to take blood pressure but pt will not allow it

## 2023-02-05 NOTE — ED Notes (Signed)
Pt returns from echo

## 2023-02-05 NOTE — Consult Note (Addendum)
Cardiology Consultation   Patient ID: [name] MRN: [id number]; DOB: 08-16-1965  Admit date: 02/04/2023 Date of Consult: 02/05/2023  PCP:  System, Provider Not In   [facility] Providers Cardiologist:  Parke Poisson, MD        Patient Profile:   [name] is a 58 y.o. female with a hx of ITP s/p splenectomy 2022, DM2, hypertension, anemia, BKA, HFpEF, CVA, ICH, PVD, CKD 3A, neurogenic bladder with chronic Foley present, poor mobility, and poor follow-up with providers and medication who is being seen 02/05/2023 for the evaluation of CHF and chest pain at the request of Dr. Isidoro Donning.  History of Present Illness:   [name] was admitted 2021 for acute on chronic diastolic heart failure exacerbation. Echocardiogram 04/11/2019 with preserved LVEF.  Nuclear stress test 04/18/19 showed no perfusion defects.  She was discharged after diuresis, but did not follow-up in the clinic.  Does not appear she followed with cardiology at outside facility.  She was admitted and 09/2020 with hemoglobin 4.5 and platelets less than 50,000, WBC 2.8.  She had not followed up with the cancer center and had run out of Lasix for the past month due to transportation issues.  She was diuresed and transfused.  She was treated with Decadron, Nplate, and IVIG and underwent splenectomy on 09/18/2020.  She was febrile postop and was treated for sepsis due to epi bacteremia with left pleural effusion.  She underwent thoracentesis on 09/22/2020.  She developed mental status change and hypoxia prompting CT chest that showed partially loculated pleural effusion with airspace consolidation LLL concerning for pneumonia and an enlarged left thyroid node.  She was subsequently discharged to CIR.  She was seen in urgent care 1/50/25 with an abscess on right buttock x 2 months.  She has limited mobility, is unable to bathe, and did not have a PCP.  She was diagnosed with abscess and treated with doxycycline and  referred to a general surgeon for further care.  She presented to [facility] ED 02/04/2023 with bilateral leg swelling 4 to 6 weeks.  She reportedly ran out of Lasix for a period of time but resumed over the past few days but reports no significant urine output.  She also reported a brief episode of left-sided chest pain on arrival to the ER that lasted a few seconds and then resolved.  She has not had a recurrence of chest pain.  Hb 9.7, PLT 202 Creatinine 2.77 --> 2.88, baseline felt to 1.3 Calcium 6.4 --> 6.7 Albumin 2.9 BNP 1181 CXR with vascular congestion She was treated with IV Lasix and calcium gluconate  HS troponin 24 --> 226 --> 216 A1c 5.4%  Cardiology was consulted for chest pain, elevated troponin, and diastolic heart failure exacerbation.  During my exam, she states that she has taken Lasix for the past 2 to 3 days without urination.  She has had swelling for the past 3 weeks.  She has also struggled with diarrhea and stool incontinence for the past 3 weeks.  She states that diarrhea and stool incontinence started prior to doxycycline for buttock abscess.  She noted that she did not urinate as usual yesterday and today with p.o. Lasix at home.  She eventually voided after IV Lasix today.  She states that she had less than 1 minute of chest discomfort yesterday and today at rest.  She is able to transfer from bed to wheelchair and to the commode, but over the past 2 days has  been unable to get off of the commode at home, requiring help.   She has undergone renal ultrasound that did not show acute findings.   Past Medical History:  Diagnosis Date   Acute exacerbation of CHF (congestive heart failure) (HCC) 04/11/2019   Anemia    Arthritis    "spine, hands" (11/25/2015)   Back pain    "all my back; probably 3 times/week" (11/25/2015)   Chronic indwelling Foley catheter 03/04/2017   Diabetic foot ulcer associated with type 2 diabetes mellitus (HCC)    Hypertension    Intracerebral  hemorrhage (HCC) As a teenager    States she had burst blood vessel as teenager, now with resultant minor visual field and hearing deficits   Neuropathy    Stroke [facility] ~ 1982   "my feeling on my RLE, right lower eye vision,  & hearing out of my right ear not the same since" (11/25/2015)   Type II diabetes mellitus (HCC)     Past Surgical History:  Procedure Laterality Date   AMPUTATION  11/24/2010   Procedure: AMPUTATION FOOT;  Surgeon: [name], MD;  Location: [facility] OR;  Service: Orthopedics;  Laterality: Right;  Right  FOOT TRANS METATARSAL AMPUTATION   AMPUTATION  07/02/2011   Procedure: AMPUTATION BELOW KNEE;  Surgeon: [name], MD;  Location: [facility] OR;  Service: Orthopedics;  Laterality: Right;   APPLICATION OF WOUND VAC  07/02/2011   Procedure: APPLICATION OF WOUND VAC;  Surgeon: [name], MD;  Location: [facility] OR;  Service: Orthopedics;  Laterality: Right;   BRAIN SURGERY  1980's   Previous brain surgery in [place], [place]   CESAREAN SECTION  2004; 2006   COLONOSCOPY N/A 09/17/2015   Procedure: COLONOSCOPY;  Surgeon: Charlott Rakes, MD;  Location: [facility] ENDOSCOPY;  Service: Endoscopy;  Laterality: N/A;   ESOPHAGOGASTRODUODENOSCOPY N/A 09/17/2015   Procedure: ESOPHAGOGASTRODUODENOSCOPY (EGD);  Surgeon: Charlott Rakes, MD;  Location: [facility] ENDOSCOPY;  Service: Endoscopy;  Laterality: N/A;   I & D EXTREMITY  11/24/2010   Procedure: IRRIGATION AND DEBRIDEMENT EXTREMITY;  Surgeon: [name], MD;  Location: [facility] OR;  Service: Orthopedics;  Laterality: Left;  Debriedment of left plantar ulcer and trimming of toenails   I & D EXTREMITY  07/02/2011   Procedure: IRRIGATION AND DEBRIDEMENT EXTREMITY;  Surgeon: [name], MD;  Location: [facility] OR;  Service: Orthopedics;  Laterality: Left;  I & D of Left Foot   I & D EXTREMITY  07/06/2011   Procedure: IRRIGATION AND DEBRIDEMENT EXTREMITY;  Surgeon: [name], MD;  Location: [facility] OR;  Service: Orthopedics;  Laterality: Right;  IRRIGATION/DEBRIDEMENT RIGHT  BELOW KNEE AMPUTATION   IR THORACENTESIS ASP PLEURAL SPACE W/IMG GUIDE  09/22/2020   LAPAROSCOPIC SPLENECTOMY N/A 09/18/2020   Procedure: HAND ASSISTED LAPAROSCOPIC SPLENECTOMY;  Surgeon: Almond Lint, MD;  Location: [facility] OR;  Service: General;  Laterality: N/A;  120 MINUTES   TUBAL LIGATION  2006     Home Medications:  Prior to Admission medications   Medication Sig Start Date End Date Taking? Authorizing Provider  acetaminophen (TYLENOL) 325 MG tablet Take 2 tablets (650 mg total) by mouth every 4 (four) hours as needed for headache or mild pain. 10/24/20  Yes Love, Evlyn Kanner, PA-C  aspirin EC 81 MG tablet Take 162 mg by mouth daily. Swallow whole.   Yes [provider]  furosemide (LASIX) 40 MG tablet Take 1 tablet (40 mg total) by mouth 2 (two) times daily. 10/24/20  Yes Love, Evlyn Kanner, PA-C  gabapentin (NEURONTIN) 400 MG  capsule Take 1 capsule (400 mg total) by mouth 2 (two) times daily. 01/28/20 08/31/23 Yes Barbette Merino, NP  isosorbide mononitrate (IMDUR) 30 MG 24 hr tablet Take 1.5 tablets (45 mg total) by mouth daily. 10/24/20 08/15/23 Yes Love, Evlyn Kanner, PA-C  loperamide (IMODIUM) 2 MG capsule Take 1 capsule (2 mg total) by mouth as needed for diarrhea or loose stools. 10/02/20  Yes Almon Hercules, MD  metoprolol succinate (TOPROL-XL) 50 MG 24 hr tablet Take 1 tablet (50 mg total) by mouth daily. Take with or immediately following a meal. 01/28/20 09/12/23 Yes King, Shana Chute, NP  tetrahydrozoline 0.05 % ophthalmic solution Place 2 drops into both eyes 2 (two) times daily.   Yes [provider]  doxycycline (VIBRAMYCIN) 100 MG capsule Take 1 capsule (100 mg total) by mouth 2 (two) times daily. Patient not taking: Reported on 02/05/2023 01/19/23   Cathlyn Parsons, NP    Inpatient Medications: Scheduled Meds:  furosemide  40 mg Intravenous BID   gabapentin  400 mg Oral BID   heparin injection (subcutaneous)  5,000 Units Subcutaneous Q8H   isosorbide mononitrate  45 mg Oral  Daily   metoprolol succinate  50 mg Oral Daily   Continuous Infusions:  PRN Meds: acetaminophen **OR** acetaminophen, loperamide  Allergies:   No Known Allergies  Social History:   Social History   Socioeconomic History   Marital status: Widowed    Spouse name: Not on file   Number of children: 2   Years of education: Not on file   Highest education level: Some college, no degree  Occupational History   Occupation: disability  Tobacco Use   Smoking status: Never   Smokeless tobacco: Never  Vaping Use   Vaping status: Never Used  Substance and Sexual Activity   Alcohol use: Yes    Alcohol/week: 2.0 standard drinks of alcohol    Types: 2 Glasses of wine per week   Drug use: No   Sexual activity: Not Currently    Birth control/protection: Post-menopausal  Other Topics Concern   Not on file  Social History Narrative   Widowed in 2012   Lives in a home she owns   No wheelchair ramp, though garage entry is flush to both garage and entry floor.   Unable to use full bathroom upstairs.   Has prosthesis for right LE, but never learned how to use.   Cannot see well due to cataracts.   Did not receive disability in 09/2017 with application.   Possibly due to husband's SS she receives   Social Drivers of [employer]: Low Risk  (09/15/2020)   Overall Financial Resource Strain (CARDIA)    Difficulty of Paying Living Expenses: Not very hard  Food Insecurity: No Food Insecurity (09/15/2020)   Hunger Vital Sign    Worried About Running Out of Food in the Last Year: Never true    Ran Out of Food in the Last Year: Never true  Transportation Needs: Unmet Transportation Needs (09/15/2020)   PRAPARE - Administrator, Civil Service (Medical): No    Lack of Transportation (Non-Medical): Yes  Physical Activity: Not on file  Stress: Not on file  Social Connections: Not on file  Intimate Partner Violence: Not on file    Family History:    Family  History  Problem Relation Age of Onset   Diabetes Mother    Hypertension Mother    Hyperlipidemia Mother    Diabetes Father  Cancer Father        prostate   Hyperlipidemia Father    Hypertension Father    Diabetes Brother    Peripheral Artery Disease Brother        has had toes amputated   Diabetes Son      ROS:  Please see the history of present illness.   All other ROS reviewed and negative.     Physical Exam/Data:   Vitals:   02/05/23 0720 02/05/23 0800 02/05/23 0926 02/05/23 0927  BP: (!) 124/59 (!) 144/64  (!) 141/63  Pulse: 69 67  71  Resp: (!) 21 18  20   Temp:   97.9 F (36.6 C)   TempSrc:   Oral   SpO2: 99% 100%  100%  Weight:      Height:        Intake/Output Summary (Last 24 hours) at 02/05/2023 1141 Last data filed at 02/05/2023 0411 Gross per 24 hour  Intake 45.56 ml  Output --  Net 45.56 ml      02/04/2023    7:21 PM 11/17/2020    2:37 PM 10/22/2020    3:57 AM  Last 3 Weights  Weight (lbs) 227 lb 1.2 oz -- 227 lb 1.2 oz  Weight (kg) 103 kg -- 103 kg     Body mass index is 35.56 kg/m.  General:  obese female in NAD, right BKA HEENT: normal Neck: no JVD Vascular: No carotid bruits; Distal pulses 2+ bilaterally Cardiac:  normal S1, S2; RRR; soft murmur  heart left sternal border Lungs:  clear to auscultation bilaterally, no wheezing, rhonchi or rales  Abd: soft, nontender, no hepatomegaly  Ext: B LE swelling, right BKA, pulse not palpated on left due to swelling Musculoskeletal:  No deformities, BUE and BLE strength normal and equal Skin: warm and dry  Neuro:  CNs 2-12 intact, no focal abnormalities noted Psych:  Normal affect   EKG:  The EKG was personally reviewed and demonstrates:  sinus rhythm with HR 68, QTcB 499 ms Telemetry:  Telemetry was personally reviewed and demonstrates:  sinus rhythm with HR 60s  Relevant CV Studies:  Echo 2022: 1. Left ventricular ejection fraction, by estimation, is 60 to 65%. The  left ventricle has  normal function. The left ventricle has no regional  wall motion abnormalities. There is mild concentric left ventricular  hypertrophy. Left ventricular diastolic  parameters are indeterminate. Elevated left ventricular end-diastolic  pressure.   2. Right ventricular systolic function is normal. The right ventricular  size is mildly enlarged.   3. Right atrial size was moderately dilated.   4. The mitral valve is normal in structure. Mild mitral valve  regurgitation. No evidence of mitral stenosis.   5. The aortic valve is normal in structure. Aortic valve regurgitation is  not visualized. No aortic stenosis is present.   6. The inferior vena cava is normal in size with greater than 50%  respiratory variability, suggesting right atrial pressure of 3 mmHg.   Laboratory Data:  High Sensitivity Troponin:   Recent Labs  Lab 02/05/23 0724 02/05/23 0925  TROPONINIHS 226* 216*     Chemistry Recent Labs  Lab 02/04/23 2000 02/05/23 0724  NA 131* 132*  K 5.5* 5.4*  CL 106 107  CO2 14* 12*  GLUCOSE 100* 90  BUN 50* 52*  CREATININE 2.77* 2.88*  CALCIUM 6.4* 6.7*  MG 2.4  --   GFRNONAA 19* 19*  ANIONGAP 11 13    Recent Labs  Lab 02/05/23  0724  PROT 7.3  ALBUMIN 2.9*  AST 32  ALT 20  ALKPHOS 205*  BILITOT 1.1   Lipids No results for input(s): "CHOL", "TRIG", "HDL", "LABVLDL", "LDLCALC", "CHOLHDL" in the last 168 hours.  Hematology Recent Labs  Lab 02/04/23 2000 02/05/23 0724  WBC 8.2 7.6  RBC 3.15* 3.12*  HGB 9.8* 9.7*  HCT 30.5* 29.9*  MCV 96.8 95.8  MCH 31.1 31.1  MCHC 32.1 32.4  RDW 18.6* 18.5*  PLT 212 202   Thyroid No results for input(s): "TSH", "FREET4" in the last 168 hours.  BNP Recent Labs  Lab 02/04/23 2000  BNP 1,180.9*    DDimer No results for input(s): "DDIMER" in the last 168 hours.   Radiology/Studies:  US RENAL Result Date: 02/05/2023 CLINICAL DATA:  Acute kidney injury EXAM: RENAL / URINARY TRACT ULTRASOUND COMPLETE COMPARISON:  CT  09/23/2020 FINDINGS: Right Kidney: Renal measurements: 10.3 x 4 x 6.6 = volume: 141 mL. Echogenicity within normal limits. No mass or hydronephrosis visualized. Left Kidney: Renal measurements: 11.7 x 5.2 x 5.7 = volume: 180 mL. Echogenicity within normal limits. No mass or hydronephrosis visualized. Bladder: Appears normal for degree of bladder distention. Other: None. IMPRESSION: Negative. Electronically Signed   By: Corlis Leak M.D.   On: 02/05/2023 08:07   DG Chest Portable 1 View Result Date: 02/04/2023 CLINICAL DATA:  CHF EXAM: PORTABLE CHEST 1 VIEW COMPARISON:  09/28/2020 FINDINGS: Cardiomegaly with mild central congestion. Probable trace pleural effusions. No focal consolidation or pneumothorax. Atelectasis or scarring at the right base IMPRESSION: Cardiomegaly with mild central congestion and probable trace pleural effusions. Electronically Signed   By: Jasmine Pang M.D.   On: 02/04/2023 20:52     Assessment and Plan:   Acute on chronic diastolic heart failure - BNP 1181 - Has been started on 40 mg IV Lasix twice daily - Given renal function, we Tudor Chandley increase this to 80 mg IV twice daily -Needs strict I's and O's, daily weights - echo pending - would rule out UTI given weeks of stool incontinence - does appear volume up   Chest pain Elevated troponin - HS troponin 24 --> 26 --> 216 - EKG does not appear ischemic - brief episode of chest pain on arrival, no recurrence -She is quite sedentary after BKA in 2013, exertional symptoms difficult to evaluate - given renal function, Ariell Gunnels first diurese - may ultimately need right and left heart cath, but renal function Johnpaul Gillentine need to be monitored - continue ASA - continue imdur and toprol   Hypertension - BP elevated - currently on 45 mg imdur, 50 mg toprol   Acute on CKD stage III - sCr 2.88 - baseline likely 1.3 - Andrea Ferrer trial diuresis, but Dilara Navarrete likely need nephrology input - given diarrhea and stool incontinence, recommend UA to  rule out UTI - if voiding has stalled, would bladder scan to rule out retention, although renal US was negative for acute findings   ITP Now s/p splenectomy - Hb stable, PLT WNL     Risk Assessment/Risk Scores:     TIMI Risk Score for Unstable Angina or Non-ST Elevation MI:   The patient's TIMI risk score is 3, which indicates a 13% risk of all cause mortality, new or recurrent myocardial infarction or need for urgent revascularization in the next 14 days.  New York Heart Association (NYHA) Functional Class NYHA Class IV      For questions or updates, please contact [facility] Please consult [email or web address] for contact info  under    Signed, Marcelino Duster, PA  02/05/2023 11:41 AM  I have seen and examined this patient with Lowry Ram Duke.  Agree with above, note added to reflect my findings.  She has a complex past medical history including ITP postsplenectomy, diabetes, CKD stage IIIa, diastolic heart failure, hypertension, CVA, PVD post right BKA, neurogenic bladder who presented to the hospital with swelling and weakness.  She has been having swelling for 4 to 6 weeks.  She had been taking Lasix, but had no significant change in urinary output.  She thus presented to the emergency room.  She was found to have a significantly elevated creatinine, above baseline.  She did present to urgent care earlier last month with an abscess on her right buttock.  She has been unable to bathe due to worsening mobility.  She has been also having loose stools.  GEN: Well nourished, well developed, in no acute distress  HEENT: normal  Neck: no JVD, carotid bruits, or masses Cardiac: RRR; no murmurs, rubs, or gallops,no edema  Respiratory:  clear to auscultation bilaterally, normal work of breathing GI: soft, nontender, nondistended, + BS MS: no deformity or atrophy  Skin: warm and dry Neuro:  Strength and sensation are intact Psych: euthymic mood, full affect   Acute on chronic  diastolic heart failure: BNP significantly elevated.  This is complicated by her worsening renal function.  She has been taking p.o. Lasix without improvement.  She did receive IV Lasix with minimal urine output.  Teghan Philbin increase Lasix to 80 mg twice daily.  She Sula Fetterly need close monitoring of her renal function. Chest pain: At this point, pain seems somewhat atypical.  Troponin elevation potentially related to diastolic heart failure exacerbation with acute renal failure.  Lianah Peed continue to monitor.  Given her renal function, not currently a candidate for ischemic evaluation. Hypertension: Continue Imdur and metoprolol Acute on chronic CKD stage IIIa: Trial of diuresis as above.  Tracey Stewart need close monitoring of renal function.  Deah Ottaway M. Keyen Marban MD 02/05/2023 12:30 PM

## 2023-02-05 DEATH — deceased

## 2023-02-06 ENCOUNTER — Inpatient Hospital Stay (HOSPITAL_COMMUNITY): Payer: Medicare Other

## 2023-02-06 DIAGNOSIS — I5033 Acute on chronic diastolic (congestive) heart failure: Secondary | ICD-10-CM | POA: Diagnosis not present

## 2023-02-06 DIAGNOSIS — I5043 Acute on chronic combined systolic (congestive) and diastolic (congestive) heart failure: Secondary | ICD-10-CM | POA: Diagnosis not present

## 2023-02-06 DIAGNOSIS — N179 Acute kidney failure, unspecified: Secondary | ICD-10-CM

## 2023-02-06 LAB — CBC
HCT: 28 % — ABNORMAL LOW (ref 36.0–46.0)
Hemoglobin: 9.2 g/dL — ABNORMAL LOW (ref 12.0–15.0)
MCH: 30.8 pg (ref 26.0–34.0)
MCHC: 32.9 g/dL (ref 30.0–36.0)
MCV: 93.6 fL (ref 80.0–100.0)
Platelets: 214 10*3/uL (ref 150–400)
RBC: 2.99 MIL/uL — ABNORMAL LOW (ref 3.87–5.11)
RDW: 17.4 % — ABNORMAL HIGH (ref 11.5–15.5)
WBC: 8.1 10*3/uL (ref 4.0–10.5)
nRBC: 6.2 % — ABNORMAL HIGH (ref 0.0–0.2)

## 2023-02-06 LAB — GASTROINTESTINAL PANEL BY PCR, STOOL (REPLACES STOOL CULTURE)

## 2023-02-06 LAB — BASIC METABOLIC PANEL
Anion gap: 14 (ref 5–15)
BUN: 55 mg/dL — ABNORMAL HIGH (ref 6–20)
CO2: 13 mmol/L — ABNORMAL LOW (ref 22–32)
Calcium: 6.5 mg/dL — ABNORMAL LOW (ref 8.9–10.3)
Chloride: 106 mmol/L (ref 98–111)
Creatinine, Ser: 2.52 mg/dL — ABNORMAL HIGH (ref 0.44–1.00)
GFR, Estimated: 22 mL/min — ABNORMAL LOW (ref >60)
Glucose, Bld: 100 mg/dL — ABNORMAL HIGH (ref 70–99)
Potassium: 4.2 mmol/L (ref 3.5–5.1)
Sodium: 133 mmol/L — ABNORMAL LOW (ref 135–145)

## 2023-02-06 MED ORDER — LOPERAMIDE HCL 2 MG PO CAPS
4.0000 mg | ORAL_CAPSULE | Freq: Once | ORAL | Status: AC
Start: 2023-02-06 — End: 2023-02-06
  Administered 2023-02-06: 4 mg via ORAL
  Filled 2023-02-06: qty 2

## 2023-02-06 MED ORDER — METRONIDAZOLE 500 MG/100ML IV SOLN
500.0000 mg | Freq: Two times a day (BID) | INTRAVENOUS | Status: DC
  Administered 2023-02-06 – 2023-02-14 (×17): 500 mg via INTRAVENOUS
  Filled 2023-02-06 (×19): qty 100

## 2023-02-06 MED ORDER — SODIUM CHLORIDE 0.9 % IV SOLN
2.0000 g | INTRAVENOUS | Status: DC
  Administered 2023-02-07 – 2023-02-14 (×8): 2 g via INTRAVENOUS
  Filled 2023-02-06 (×8): qty 20

## 2023-02-06 MED ORDER — LOPERAMIDE HCL 2 MG PO CAPS
2.0000 mg | ORAL_CAPSULE | Freq: Three times a day (TID) | ORAL | Status: AC
  Administered 2023-02-06 – 2023-02-08 (×6): 2 mg via ORAL
  Filled 2023-02-06 (×6): qty 1

## 2023-02-06 MED ORDER — SODIUM CHLORIDE 0.9 % IV SOLN
2.0000 g | Freq: Once | INTRAVENOUS | Status: AC
  Administered 2023-02-06: 2 g via INTRAVENOUS
  Filled 2023-02-06: qty 20

## 2023-02-06 MED ORDER — GERHARDT'S BUTT CREAM
TOPICAL_CREAM | Freq: Three times a day (TID) | CUTANEOUS | Status: DC
  Administered 2023-02-15 – 2023-02-17 (×4): 1 via TOPICAL
  Filled 2023-02-06 (×2): qty 60

## 2023-02-06 NOTE — Evaluation (Signed)
Physical Therapy Evaluation Patient Details Name: [name] MRN: [id number] DOB: 01/01/1966 Today's Date: 02/06/2023  History of Present Illness  Pt is a 58 y.o. female who presented 02/05/23 with bil lower extremity edema. Admitted with acute on chronic diastolic heart failure. PMH: ITP, DM2, CKD stage IIIa, chronic HFpEF, HTN, CVA, ICH, PVD, right BKA, neurogenic bladder/chronic Foley   Clinical Impression  Pt presents with condition above and deficits mentioned below, see PT Problem List. PTA, she was living with her son, who works 6 hours/day 5 days/week (per pt report), in a 2-level house with 1 small STE. She stays on the main level of the house. Her son typically bumps her up/down the x1 stair in her w/c. At baseline, pt is mod I for mobility using her w/c and mod I for squat pivot transfers. She reports she has not worn her R leg prosthesis in a few months, and has been unable to donn it due to her R leg edema recently. She denies being able to ambulate even when her prosthesis is able to bed donned. Currently, the pt is needing extra time and CGA for bed mobility and minA to laterally scoot to L to transfer from bed to chair. She has a difficult time clearing her buttocks to scoot due to generalized weakness at this time. Considering she is home alone for periods of time during the day and is typically mod I for mobility and is currently needing assistance, she could benefit from intensive inpatient rehab, > 3 hours/day, to ideally return her to being mod I. However, if the pt progresses quickly, she may be able to flip to going home with HHPT follow-up instead. Will continue to follow acutely and assess d/c needs.       If plan is discharge home, recommend the following: A little help with walking and/or transfers;A little help with bathing/dressing/bathroom;Assistance with cooking/housework;Direct supervision/assist for financial management;Direct supervision/assist for medications  management;Assist for transportation;Help with stairs or ramp for entrance;Supervision due to cognitive status   Can travel by private vehicle        Equipment Recommendations None recommended by PT  Recommendations for Other Services  OT consult;Rehab consult    Functional Status Assessment Patient has had a recent decline in their functional status and demonstrates the ability to make significant improvements in function in a reasonable and predictable amount of time.     Precautions / Restrictions Precautions Precautions: Fall Precaution Comments: R BKA (prosthesis not present); flexiseal Restrictions Weight Bearing Restrictions Per Provider Order: No      Mobility  Bed Mobility Overal bed mobility: Needs Assistance Bed Mobility: Supine to Sit     Supine to sit: Contact guard, HOB elevated, Used rails     General bed mobility comments: Extra time needed to bring legs off EOB and pivot and scoot hips to edge, CGA for safety    Transfers Overall transfer level: Needs assistance Equipment used: None Transfers: Bed to chair/wheelchair/BSC, Sit to/from Stand Sit to Stand: Min assist          Lateral/Scoot Transfers: Min assist, +2 safety/equipment General transfer comment: Pt needed cues to scoot along EOB to get more proximal to the recliner before scooting laterally to the L into the recliner. Cues needed for hand placement as well as pt had difficulty problem-solving where to place her hands. MinA needed to boost her buttocks and scoot her to the L from bed to recliner with recliner armrest lifted. Pt then came to a half  stand x2 reps once in recliner, keeping her hands on the armrests to push up each rep, minA to power up    Ambulation/Gait               General Gait Details: unable at baseline  Stairs            Wheelchair Mobility     Tilt Bed    Modified Rankin (Stroke Patients Only)       Balance Overall balance assessment: Needs  assistance Sitting-balance support: No upper extremity supported, Feet supported Sitting balance-Leahy Scale: Fair Sitting balance - Comments: Static sitting with CGA for safety   Standing balance support: Bilateral upper extremity supported Standing balance-Leahy Scale: Poor Standing balance comment: Reliant on UE support and minA to come to a half stand                             Pertinent Vitals/Pain Pain Assessment Pain Assessment: Faces Faces Pain Scale: Hurts little more Pain Location: legs Pain Descriptors / Indicators: Discomfort Pain Intervention(s): Limited activity within patient's tolerance, Monitored during session, Repositioned    Home Living Family/patient expects to be discharged to:: Private residence Living Arrangements: Children (son) Available Help at Discharge: Family;Available PRN/intermittently (son works 6 hours/day 5 days/week) Type of Home: House Home Access: Stairs to enter   [employer] of Steps: 1 (short stair at garage she gets bumped up/down in her w/c by son)   Home Layout: Two level;Able to live on main level with bedroom/bathroom Home Equipment: Agricultural consultant (2 wheels);BSC/3in1;Wheelchair - manual;Hospital bed      Prior Function Prior Level of Function : Independent/Modified Independent             Mobility Comments: few months since last wore prosthesis, limited by R leg edema recently; denied ambulating even with prosthesis donned; squat pivots to/from w/c mod I, mod I mobility with w/c; son bumps pt up/down x1 STE the home in her w/c ADLs Comments: states she can bathe and dress herself     Extremity/Trunk Assessment   Upper Extremity Assessment Upper Extremity Assessment: Defer to OT evaluation    Lower Extremity Assessment Lower Extremity Assessment: RLE deficits/detail;LLE deficits/detail RLE Deficits / Details: reports diminished sensation throughout entire leg; gross weakness noted LLE Deficits /  Details: gross weakness noted, approximately 4/5    Cervical / Trunk Assessment Cervical / Trunk Assessment: Normal  Communication   Communication Communication: No apparent difficulties  Cognition Arousal: Alert Behavior During Therapy: Anxious, Lability Overall Cognitive Status: Impaired/Different from baseline Area of Impairment: Attention, Memory, Following commands, Problem solving                   Current Attention Level: Selective Memory: Decreased short-term memory Following Commands: Follows one step commands consistently, Follows one step commands with increased time     Problem Solving: Slow processing, Difficulty sequencing, Requires verbal cues General Comments: Pt denied memory or cognitive deficits at baseline. No family present to confirm at this time, but pt is currently demonstrating deficits in attention, needing cues to redirect at times. She also tends to repeat herself, suggesting memory deficits.        General Comments General comments (skin integrity, edema, etc.): encouraged pt to get OOB daily with staff here and continue to perform AROM of extremities to maintain strength, she verbalized understanding    Exercises     Assessment/Plan    PT Assessment Patient needs continued  PT services  PT Problem List Decreased strength;Decreased activity tolerance;Decreased balance;Decreased cognition;Decreased mobility;Cardiopulmonary status limiting activity;Impaired sensation       PT Treatment Interventions DME instruction;Functional mobility training;Therapeutic activities;Therapeutic exercise;Balance training;Neuromuscular re-education;Cognitive remediation;Patient/family education;Wheelchair mobility training    PT Goals (Current goals can be found in the Care Plan section)  Acute Rehab PT Goals Patient Stated Goal: to get stronger PT Goal Formulation: With patient Time For Goal Achievement: 02/20/23 Potential to Achieve Goals: Good     Frequency Min 1X/week     Co-evaluation               AM-PAC PT "6 Clicks" Mobility  Outcome Measure Help needed turning from your back to your side while in a flat bed without using bedrails?: A Little Help needed moving from lying on your back to sitting on the side of a flat bed without using bedrails?: A Little Help needed moving to and from a bed to a chair (including a wheelchair)?: A Little Help needed standing up from a chair using your arms (e.g., wheelchair or bedside chair)?: A Lot Help needed to walk in hospital room?: Total Help needed climbing 3-5 steps with a railing? : Total 6 Click Score: 13    End of Session Equipment Utilized During Treatment: Gait belt Activity Tolerance: Patient tolerated treatment well Patient left: in chair;with call bell/phone within reach;with chair alarm set Nurse Communication: Mobility status PT Visit Diagnosis: Unsteadiness on feet (R26.81);Muscle weakness (generalized) (M62.81);Difficulty in walking, not elsewhere classified (R26.2)    Time: 1610-9604 PT Time Calculation (min) (ACUTE ONLY): 27 min   Charges:   PT Evaluation $PT Eval Moderate Complexity: 1 Mod PT Treatments $Therapeutic Activity: 8-22 mins PT General Charges $$ ACUTE PT VISIT: 1 Visit         Virgil Benedict, PT, DPT Acute Rehabilitation Services  Office: [phone]   Bettina Gavia 02/06/2023, 5:06 PM

## 2023-02-06 NOTE — Progress Notes (Signed)
[facility]                                                                              [name], is a 58 y.o. female, DOB - [birth year], DGL:[id number] Admit date - 02/04/2023    Outpatient Primary MD for the patient is System, Provider Not In  LOS - 1  days  Chief Complaint  Patient presents with   Leg Swelling       Brief summary   Patient is a 58 year old female with ITP, diabetes mellitus type 2, CKD stage IIIa, chronic HFpEF, hypertension, CVA, ICH, PVD, right BKA, neurogenic bladder/chronic Foley presented to ED with bilateral lower extremity edema.  Patient reported 1 week of progressively worsening lower extremity edema up to her thighs.  Reported that it was getting difficult for her to get up from the wheelchair as her legs felt tight.  She had been taking Lasix 40 mg twice daily at home but not having much urine output.  Denied any shortness of breath or cough.  She also had a brief episode of left-sided chest pain.  In ED, hemoglobin 9.8, sodium 131, K5.5, bicarb 14, glucose 100, creatinine 2.7, calcium 6.4, mag 2.4, BNP 1180 Chest x-ray showed cardiomegaly with central congestion, pleural effusion. Received IV Lasix 40 mg and IV calcium gluconate 1 g.   Troponin 226 -  216  Assessment & Plan    Principal Problem:   Acute on chronic heart failure with preserved ejection fraction (HFpEF) (HCC) Elevated troponin likely due to demand ischemia - Echo 06/2020: EF 60 to 65%, mild MVR.  -presented with significant bilateral lower extremity edema up to her thighs.  BNP 1180. Troponin 226 -  216. Cr 2.7, ?cardiorenal syndrome with anasarca -2D echo shows EF of 55 to 60%, no regional WMA, right ventricular pressure overload, G1 DD, low normal right ventricular systolic function, severely elevated pulmonary artery systolic pressure. -Continue IV Lasix 40 mg twice daily, cardiology following. -May need to increase Lasix and add albumin, probably third spacing  due to hypoalbuminemia, albumin level 2.9 on 10/1, need strict I's and O's and daily weights.  Active Problems:   AKI on CKD stage IIIa, metabolic acidosis, hyperkalemia  - possibly cardiorenal in the setting of acutely decompensated CHF.  - Cr 1.2 on labs done over 2 years ago, couldn't find recent labs in care everywhere. -Continue sod bicarb 650mg  TID.  Creatinine stable and improving, 2.5  Diarrhea -Unclear etiology, no significant abdominal pain, fevers or chills, leukocytosis.  Per patient, ongoing for about a week -C. difficile negative, GI pathogen panel negative -CT abdomen pelvis showed anasarca with diffuse mesenteric edema, otherwise no colitis or acute abdominal pathology -Placed on IV Rocephin, Flagyl, Imodium, Flexi-Seal -If no significant improvement, will discuss with GI     Mild hypervolemic hyponatremia Continue to monitor Na, stable  Hyperkalemia -Improved  QT prolongation,  Hypocalcemia Calcium 6.4 on BMP and patient was given IV calcium gluconate in the ED. QTc 522 on EKG.  Magnesium within normal range.  -Corrected calcium albumin 7.6 today  Essential HTN (hypertension) -Continue IV Lasix, Imdur, metoprolol    Diabetes mellitus (  HCC), diet controlled, -Hemoglobin A1c 5.4 -Continue follow-up   Chronic normocytic anemia -Baseline hemoglobin 9-10 -H&H stable  Obesity class II Estimated body mass index is 35.56 kg/m as calculated from the following:   Height as of this encounter: 5\' 7"  (1.702 m).   Weight as of this encounter: 103 kg.  Code Status: Full code DVT Prophylaxis:  heparin injection 5,000 Units Start: 02/05/23 1400   Level of Care: Level of care: Telemetry Cardiac Family Communication: Updated patient Disposition Plan:      Remains inpatient appropriate:      Procedures:    Consultants:   Cardiology  Antimicrobials:   Anti-infectives (From admission, onward)    Start     Dose/Rate Route Frequency Ordered Stop   02/07/23  0800  cefTRIAXone (ROCEPHIN) 2 g in sodium chloride 0.9 % 100 mL IVPB        2 g 200 mL/hr over 30 Minutes Intravenous Every 24 hours 02/06/23 0713     02/06/23 0800  metroNIDAZOLE (FLAGYL) IVPB 500 mg        500 mg 100 mL/hr over 60 Minutes Intravenous Every 12 hours 02/06/23 0704     02/06/23 0715  cefTRIAXone (ROCEPHIN) 2 g in sodium chloride 0.9 % 100 mL IVPB        2 g 200 mL/hr over 30 Minutes Intravenous  Once 02/06/23 0704 02/06/23 1037          Medications  furosemide  40 mg Intravenous BID   gabapentin  400 mg Oral BID   heparin injection (subcutaneous)  5,000 Units Subcutaneous Q8H   isosorbide mononitrate  45 mg Oral Daily   loperamide  2 mg Oral TID   metoprolol succinate  50 mg Oral Daily   sodium bicarbonate  650 mg Oral TID      Subjective:   [name] was seen and examined today.  Concerned about ongoing diarrhea, Flexi-Seal having rectal irritation.   No abdominal pain, nausea vomiting, fevers.   Objective:   Vitals:   02/06/23 0800 02/06/23 1000 02/06/23 1132 02/06/23 1213  BP:  (!) 137/101 (!) 159/71   Pulse:  (!) 121 72   Resp:  19    Temp: 98.5 F (36.9 C)   98.6 F (37 C)  TempSrc: Oral   Oral  SpO2:  97%    Weight:      Height:       No intake or output data in the 24 hours ending 02/06/23 1220    Wt Readings from Last 3 Encounters:  02/04/23 103 kg  10/22/20 103 kg  10/02/20 107 kg   Physical Exam General: Alert and oriented x 3, NAD Cardiovascular: S1 S2 clear, RRR.  Respiratory: CTAB, no wheezing Gastrointestinal: Soft, nontender, nondistended, NBS Ext: right BKA, pitting edema up to thighs Neuro: no new deficits Psych: Normal affect     Data Reviewed:  I have personally reviewed following labs    CBC Lab Results  Component Value Date   WBC 8.1 02/06/2023   RBC 2.99 (L) 02/06/2023   HGB 9.2 (L) 02/06/2023   HCT 28.0 (L) 02/06/2023   MCV 93.6 02/06/2023   MCH 30.8 02/06/2023   PLT 214 02/06/2023   MCHC  32.9 02/06/2023   RDW 17.4 (H) 02/06/2023   LYMPHSABS 1.3 11/17/2020   MONOABS 0.8 11/17/2020   EOSABS 0.1 11/17/2020   BASOSABS 0.1 11/17/2020     Last metabolic panel Lab Results  Component Value Date   NA 133 (L) 02/06/2023  K 4.2 02/06/2023   CL 106 02/06/2023   CO2 13 (L) 02/06/2023   BUN 55 (H) 02/06/2023   CREATININE 2.52 (H) 02/06/2023   GLUCOSE 100 (H) 02/06/2023   GFRNONAA 22 (L) 02/06/2023   GFRAA 59 (L) 09/19/2019   CALCIUM 6.5 (L) 02/06/2023   PHOS 2.9 10/01/2020   PROT 7.3 02/05/2023   ALBUMIN 2.9 (L) 02/05/2023   LABGLOB 4.0 05/16/2019   AGRATIO 1.1 (L) 05/16/2019   BILITOT 1.1 02/05/2023   ALKPHOS 205 (H) 02/05/2023   AST 32 02/05/2023   ALT 20 02/05/2023   ANIONGAP 14 02/06/2023    CBG (last 3)  No results for input(s): "GLUCAP" in the last 72 hours.    Coagulation Profile: No results for input(s): "INR", "PROTIME" in the last 168 hours.   Radiology Studies: I have personally reviewed the imaging studies  CT ABDOMEN PELVIS WO CONTRAST Result Date: 02/06/2023 CLINICAL DATA:  58 year old female with history of nonlocalized abdominal pain. Diarrhea. EXAM: CT ABDOMEN AND PELVIS WITHOUT CONTRAST TECHNIQUE: Multidetector CT imaging of the abdomen and pelvis was performed following the standard protocol without IV contrast. RADIATION DOSE REDUCTION: This exam was performed according to the departmental dose-optimization program which includes automated exposure control, adjustment of the mA and/or kV according to patient size and/or use of iterative reconstruction technique. COMPARISON:  CT of the abdomen and pelvis 09/23/2020. FINDINGS: Lower chest: Trace left pleural effusion. Mild scarring in the visualized lung bases. Cardiomegaly. Hepatobiliary: No definite suspicious cystic or solid hepatic lesions are confidently identified on today's noncontrast CT examination. Small amount of high attenuation material in the lumen of the gallbladder most evident near the  fundus. Gallbladder is nearly completely decompressed. Pancreas: No definite pancreatic mass or peripancreatic fluid collections or inflammatory changes are confidently identified on today's noncontrast CT examination. Spleen: Status post splenectomy. Small splenules are noted in the left upper quadrant. Adrenals/Urinary Tract: Unenhanced appearance of the kidneys bilaterally is unremarkable. No hydroureteronephrosis. Urinary bladder is unremarkable in appearance. Bilateral adrenal glands are normal in appearance. Stomach/Bowel: Unenhanced appearance of the stomach is normal. No pathologic dilatation of small bowel or colon. Rectal bag noted. Normal appendix. Vascular/Lymphatic: Atherosclerosis in the abdominal and pelvic vasculature. No lymphadenopathy noted in the abdomen or pelvis. Reproductive: Uterus and ovaries are atrophic and otherwise unremarkable in appearance. Other: Trace volume of ascites. Mild diffuse mesenteric edema. No pneumoperitoneum. Musculoskeletal: Mild diffuse body wall edema. There are no aggressive appearing lytic or blastic lesions noted in the visualized portions of the skeleton. IMPRESSION: 1. No definite acute findings are noted in the abdomen or pelvis to account for the patient's symptoms. 2. Trace left pleural effusion. Trace volume of ascites. Mild diffuse mesenteric edema. Mild diffuse body wall edema. Findings are suggestive of anasarca. 3. Cardiomegaly. 4. Atherosclerosis. Electronically Signed   By: Trudie Reed M.D.   On: 02/06/2023 08:53   ECHOCARDIOGRAM COMPLETE Result Date: 02/05/2023    ECHOCARDIOGRAM REPORT   Patient Name:   [name] Date of Exam: 02/05/2023 Medical Rec #:  [id number]         Height:       67.0 in Accession #:    [id number]        Weight:       227.1 lb Date of Birth:  May 25, 1965         BSA:          2.134 m Patient Age:    25 years  BP:           122/51 mmHg Patient Gender: F                 HR:           70 bpm. Exam Location:   Inpatient Procedure: 2D Echo, Color Doppler and Cardiac Doppler Indications:    CHF  History:        Patient has prior history of Echocardiogram examinations, most                 recent 06/13/2020. CHF, PAD; Risk Factors:Hypertension and                 Diabetes.  Sonographer:    Amy Chionchio Referring Phys: John Giovanni IMPRESSIONS  1. Left ventricular ejection fraction, by estimation, is 55 to 60%. Left ventricular ejection fraction by 2D MOD biplane is 55.8 %. The left ventricle has normal function. The left ventricle has no regional wall motion abnormalities. Left ventricular diastolic parameters are consistent with Grade I diastolic dysfunction (impaired relaxation). There is the interventricular septum is flattened in systole, consistent with right ventricular pressure overload.  2. Right ventricular systolic function is low normal. The right ventricular size is moderately enlarged. There is severely elevated pulmonary artery systolic pressure. The estimated right ventricular systolic pressure is 64.6 mmHg.  3. Right atrial size was severely dilated.  4. The mitral valve is grossly normal. Trivial mitral valve regurgitation.  5. The tricuspid valve is abnormal. Tricuspid valve regurgitation is moderate to severe.  6. The aortic valve is tricuspid. Aortic valve regurgitation is not visualized.  7. The inferior vena cava is dilated in size with <50% respiratory variability, suggesting right atrial pressure of 15 mmHg. Comparison(s): Changes from prior study are noted. 06/13/2020: LVEF 60-65%, mild LVH, moderate RAE, mild MR. FINDINGS  Left Ventricle: Left ventricular ejection fraction, by estimation, is 55 to 60%. Left ventricular ejection fraction by 2D MOD biplane is 55.8 %. The left ventricle has normal function. The left ventricle has no regional wall motion abnormalities. The left ventricular internal cavity size was normal in size. There is no left ventricular hypertrophy. The interventricular septum  is flattened in systole, consistent with right ventricular pressure overload. Left ventricular diastolic parameters are consistent with Grade I diastolic dysfunction (impaired relaxation). Indeterminate filling pressures. Right Ventricle: The right ventricular size is moderately enlarged. No increase in right ventricular wall thickness. Right ventricular systolic function is low normal. There is severely elevated pulmonary artery systolic pressure. The tricuspid regurgitant velocity is 3.52 m/s, and with an assumed right atrial pressure of 15 mmHg, the estimated right ventricular systolic pressure is 64.6 mmHg. Left Atrium: Left atrial size was normal in size. Right Atrium: Right atrial size was severely dilated. Pericardium: There is no evidence of pericardial effusion. Mitral Valve: The mitral valve is grossly normal. Trivial mitral valve regurgitation. MV peak gradient, 3.8 mmHg. The mean mitral valve gradient is 1.0 mmHg. Tricuspid Valve: The tricuspid valve is abnormal. Tricuspid valve regurgitation is moderate to severe. Aortic Valve: The aortic valve is tricuspid. Aortic valve regurgitation is not visualized. Aortic valve mean gradient measures 8.0 mmHg. Aortic valve peak gradient measures 14.9 mmHg. Aortic valve area, by VTI measures 2.52 cm. Pulmonic Valve: The pulmonic valve was normal in structure. Pulmonic valve regurgitation is not visualized. Aorta: The aortic root and ascending aorta are structurally normal, with no evidence of dilitation. Venous: The inferior vena cava is dilated in size with less than 50%  respiratory variability, suggesting right atrial pressure of 15 mmHg. IAS/Shunts: The interatrial septum was not well visualized.  LEFT VENTRICLE PLAX 2D                        Biplane EF (MOD) LVIDd:         5.10 cm         LV Biplane EF:   Left LVIDs:         3.60 cm                          ventricular LV PW:         1.10 cm                          ejection LV IVS:        0.90 cm                           fraction by LVOT diam:     2.00 cm                          2D MOD LV SV:         70                               biplane is LV SV Index:   33                               55.8 %. LVOT Area:     3.14 cm                                Diastology                                LV e' medial:    5.98 cm/s LV Volumes (MOD)               LV E/e' medial:  11.4 LV vol d, MOD    78.9 ml       LV e' lateral:   5.66 cm/s A2C:                           LV E/e' lateral: 12.0 LV vol d, MOD    81.3 ml A4C: LV vol s, MOD    44.3 ml A2C: LV vol s, MOD    26.1 ml A4C: LV SV MOD A2C:   34.6 ml LV SV MOD A4C:   81.3 ml LV SV MOD BP:    44.5 ml RIGHT VENTRICLE             IVC RV Basal diam:  4.20 cm     IVC diam: 2.50 cm RV Mid diam:    4.10 cm RV S prime:     10.90 cm/s TAPSE (M-mode): 1.8 cm LEFT ATRIUM             Index        RIGHT ATRIUM           Index LA Vol (A2C):  64.3 ml 30.13 ml/m  RA Area:     32.80 cm LA Vol (A4C):   40.4 ml 18.93 ml/m  RA Volume:   128.00 ml 59.97 ml/m LA Biplane Vol: 56.3 ml 26.38 ml/m  AORTIC VALVE                     PULMONIC VALVE AV Area (Vmax):    2.02 cm      PV Vmax:       0.60 m/s AV Area (Vmean):   2.09 cm      PV Peak grad:  1.4 mmHg AV Area (VTI):     2.52 cm AV Vmax:           193.00 cm/s AV Vmean:          125.000 cm/s AV VTI:            0.279 m AV Peak Grad:      14.9 mmHg AV Mean Grad:      8.0 mmHg LVOT Vmax:         124.00 cm/s LVOT Vmean:        83.100 cm/s LVOT VTI:          0.224 m LVOT/AV VTI ratio: 0.80  AORTA Ao Root diam: 2.50 cm Ao Asc diam:  2.90 cm MITRAL VALVE               TRICUSPID VALVE MV Area (PHT): 3.34 cm    TR Peak grad:   49.6 mmHg MV Area VTI:   2.76 cm    TR Vmax:        352.00 cm/s MV Peak grad:  3.8 mmHg MV Mean grad:  1.0 mmHg    SHUNTS MV Vmax:       0.97 m/s    Systemic VTI:  0.22 m MV Vmean:      55.8 cm/s   Systemic Diam: 2.00 cm MV Decel Time: 227 msec MV E velocity: 68.10 cm/s MV A velocity: 81.20 cm/s MV E/A ratio:  0.84 Zoila Shutter MD Electronically signed by Zoila Shutter MD Signature Date/Time: 02/05/2023/2:38:24 PM    Final    US RENAL Result Date: 02/05/2023 CLINICAL DATA:  Acute kidney injury EXAM: RENAL / URINARY TRACT ULTRASOUND COMPLETE COMPARISON:  CT 09/23/2020 FINDINGS: Right Kidney: Renal measurements: 10.3 x 4 x 6.6 = volume: 141 mL. Echogenicity within normal limits. No mass or hydronephrosis visualized. Left Kidney: Renal measurements: 11.7 x 5.2 x 5.7 = volume: 180 mL. Echogenicity within normal limits. No mass or hydronephrosis visualized. Bladder: Appears normal for degree of bladder distention. Other: None. IMPRESSION: Negative. Electronically Signed   By: Corlis Leak M.D.   On: 02/05/2023 08:07   DG Chest Portable 1 View Result Date: 02/04/2023 CLINICAL DATA:  CHF EXAM: PORTABLE CHEST 1 VIEW COMPARISON:  09/28/2020 FINDINGS: Cardiomegaly with mild central congestion. Probable trace pleural effusions. No focal consolidation or pneumothorax. Atelectasis or scarring at the right base IMPRESSION: Cardiomegaly with mild central congestion and probable trace pleural effusions. Electronically Signed   By: Jasmine Pang M.D.   On: 02/04/2023 20:52       Dharma Pare M.D. [facility] 02/06/2023, 12:20 PM  Available via Epic secure chat 7am-7pm After 7 pm, please refer to night coverage provider listed on amion.

## 2023-02-06 NOTE — Consult Note (Signed)
WOC consulted for pressure injuries Per bedside nurse, possibly Stage 1. I have alerted bedside nurse to add to nursing flow sheet as this would be POA and to treat with nursing skin care order set.   Sheliah Fiorillo [facility], CNS, [employer] [phone]

## 2023-02-06 NOTE — Progress Notes (Signed)
Rounding Note    Patient Name: [name] Date of Encounter: 02/06/2023  [facility] Cardiologist: Parke Poisson, MD  Subjective   No acute complaints.  Respiratory status stable.  Inpatient Medications    Scheduled Meds:  furosemide  40 mg Intravenous BID   gabapentin  400 mg Oral BID   heparin injection (subcutaneous)  5,000 Units Subcutaneous Q8H   isosorbide mononitrate  45 mg Oral Daily   loperamide  2 mg Oral TID   metoprolol succinate  50 mg Oral Daily   sodium bicarbonate  650 mg Oral TID   Continuous Infusions:  [START ON 02/07/2023] cefTRIAXone (ROCEPHIN)  IV     metronidazole Stopped (02/06/23 1037)   PRN Meds: acetaminophen **OR** acetaminophen   Vital Signs    Vitals:   02/06/23 0734 02/06/23 0800 02/06/23 1000 02/06/23 1132  BP: (!) 130/54  (!) 137/101 (!) 159/71  Pulse: 68  (!) 121 72  Resp: 17  19   Temp:  98.5 F (36.9 C)    TempSrc:  Oral    SpO2: 99%  97%   Weight:      Height:       No intake or output data in the 24 hours ending 02/06/23 1153    02/04/2023    7:21 PM 11/17/2020    2:37 PM 10/22/2020    3:57 AM  Last 3 Weights  Weight (lbs) 227 lb 1.2 oz -- 227 lb 1.2 oz  Weight (kg) 103 kg -- 103 kg      Telemetry    Sinus rhythm- Personally Reviewed  ECG    Sinus rhythm- Personally Reviewed  Physical Exam   GEN: No acute distress.   Neck: No JVD Cardiac: RRR, no murmurs, rubs, or gallops.  Respiratory: Clear to auscultation bilaterally. GI: Soft, nontender, non-distended  MS: 1+ edema, right-sided BKA Neuro:  Nonfocal  Psych: Normal affect   Labs    High Sensitivity Troponin:   Recent Labs  Lab 02/05/23 0724 02/05/23 0925  TROPONINIHS 226* 216*     Chemistry Recent Labs  Lab 02/04/23 2000 02/05/23 0724 02/06/23 0543  NA 131* 132* 133*  K 5.5* 5.4* 4.2  CL 106 107 106  CO2 14* 12* 13*  GLUCOSE 100* 90 100*  BUN 50* 52* 55*  CREATININE 2.77* 2.88* 2.52*  CALCIUM 6.4* 6.7* 6.5*  MG  2.4  --   --   PROT  --  7.3  --   ALBUMIN  --  2.9*  --   AST  --  32  --   ALT  --  20  --   ALKPHOS  --  205*  --   BILITOT  --  1.1  --   GFRNONAA 19* 19* 22*  ANIONGAP 11 13 14     Lipids No results for input(s): "CHOL", "TRIG", "HDL", "LABVLDL", "LDLCALC", "CHOLHDL" in the last 168 hours.  Hematology Recent Labs  Lab 02/04/23 2000 02/05/23 0724 02/06/23 0543  WBC 8.2 7.6 8.1  RBC 3.15* 3.12* 2.99*  HGB 9.8* 9.7* 9.2*  HCT 30.5* 29.9* 28.0*  MCV 96.8 95.8 93.6  MCH 31.1 31.1 30.8  MCHC 32.1 32.4 32.9  RDW 18.6* 18.5* 17.4*  PLT 212 202 214   Thyroid No results for input(s): "TSH", "FREET4" in the last 168 hours.  BNP Recent Labs  Lab 02/04/23 2000  BNP 1,180.9*    DDimer No results for input(s): "DDIMER" in the last 168 hours.   Radiology  CT ABDOMEN PELVIS WO CONTRAST Result Date: 02/06/2023 CLINICAL DATA:  58 year old female with history of nonlocalized abdominal pain. Diarrhea. EXAM: CT ABDOMEN AND PELVIS WITHOUT CONTRAST TECHNIQUE: Multidetector CT imaging of the abdomen and pelvis was performed following the standard protocol without IV contrast. RADIATION DOSE REDUCTION: This exam was performed according to the departmental dose-optimization program which includes automated exposure control, adjustment of the mA and/or kV according to patient size and/or use of iterative reconstruction technique. COMPARISON:  CT of the abdomen and pelvis 09/23/2020. FINDINGS: Lower chest: Trace left pleural effusion. Mild scarring in the visualized lung bases. Cardiomegaly. Hepatobiliary: No definite suspicious cystic or solid hepatic lesions are confidently identified on today's noncontrast CT examination. Small amount of high attenuation material in the lumen of the gallbladder most evident near the fundus. Gallbladder is nearly completely decompressed. Pancreas: No definite pancreatic mass or peripancreatic fluid collections or inflammatory changes are confidently identified on  today's noncontrast CT examination. Spleen: Status post splenectomy. Small splenules are noted in the left upper quadrant. Adrenals/Urinary Tract: Unenhanced appearance of the kidneys bilaterally is unremarkable. No hydroureteronephrosis. Urinary bladder is unremarkable in appearance. Bilateral adrenal glands are normal in appearance. Stomach/Bowel: Unenhanced appearance of the stomach is normal. No pathologic dilatation of small bowel or colon. Rectal bag noted. Normal appendix. Vascular/Lymphatic: Atherosclerosis in the abdominal and pelvic vasculature. No lymphadenopathy noted in the abdomen or pelvis. Reproductive: Uterus and ovaries are atrophic and otherwise unremarkable in appearance. Other: Trace volume of ascites. Mild diffuse mesenteric edema. No pneumoperitoneum. Musculoskeletal: Mild diffuse body wall edema. There are no aggressive appearing lytic or blastic lesions noted in the visualized portions of the skeleton. IMPRESSION: 1. No definite acute findings are noted in the abdomen or pelvis to account for the patient's symptoms. 2. Trace left pleural effusion. Trace volume of ascites. Mild diffuse mesenteric edema. Mild diffuse body wall edema. Findings are suggestive of anasarca. 3. Cardiomegaly. 4. Atherosclerosis. Electronically Signed   By: Trudie Reed M.D.   On: 02/06/2023 08:53   ECHOCARDIOGRAM COMPLETE Result Date: 02/05/2023    ECHOCARDIOGRAM REPORT   Patient Name:   [name] Date of Exam: 02/05/2023 Medical Rec #:  [id number]         Height:       67.0 in Accession #:    [id number]        Weight:       227.1 lb Date of Birth:  [birth year]         BSA:          2.134 m Patient Age:    57 years          BP:           122/51 mmHg Patient Gender: F                 HR:           70 bpm. Exam Location:  Inpatient Procedure: 2D Echo, Color Doppler and Cardiac Doppler Indications:    CHF  History:        Patient has prior history of Echocardiogram examinations, most                 recent  06/13/2020. CHF, PAD; Risk Factors:Hypertension and                 Diabetes.  Sonographer:    Amy Chionchio Referring Phys: John Giovanni IMPRESSIONS  1. Left ventricular ejection fraction, by estimation, is 55 to 60%. Left ventricular ejection  fraction by 2D MOD biplane is 55.8 %. The left ventricle has normal function. The left ventricle has no regional wall motion abnormalities. Left ventricular diastolic parameters are consistent with Grade I diastolic dysfunction (impaired relaxation). There is the interventricular septum is flattened in systole, consistent with right ventricular pressure overload.  2. Right ventricular systolic function is low normal. The right ventricular size is moderately enlarged. There is severely elevated pulmonary artery systolic pressure. The estimated right ventricular systolic pressure is 64.6 mmHg.  3. Right atrial size was severely dilated.  4. The mitral valve is grossly normal. Trivial mitral valve regurgitation.  5. The tricuspid valve is abnormal. Tricuspid valve regurgitation is moderate to severe.  6. The aortic valve is tricuspid. Aortic valve regurgitation is not visualized.  7. The inferior vena cava is dilated in size with <50% respiratory variability, suggesting right atrial pressure of 15 mmHg. Comparison(s): Changes from prior study are noted. 06/13/2020: LVEF 60-65%, mild LVH, moderate RAE, mild MR. FINDINGS  Left Ventricle: Left ventricular ejection fraction, by estimation, is 55 to 60%. Left ventricular ejection fraction by 2D MOD biplane is 55.8 %. The left ventricle has normal function. The left ventricle has no regional wall motion abnormalities. The left ventricular internal cavity size was normal in size. There is no left ventricular hypertrophy. The interventricular septum is flattened in systole, consistent with right ventricular pressure overload. Left ventricular diastolic parameters are consistent with Grade I diastolic dysfunction (impaired  relaxation). Indeterminate filling pressures. Right Ventricle: The right ventricular size is moderately enlarged. No increase in right ventricular wall thickness. Right ventricular systolic function is low normal. There is severely elevated pulmonary artery systolic pressure. The tricuspid regurgitant velocity is 3.52 m/s, and with an assumed right atrial pressure of 15 mmHg, the estimated right ventricular systolic pressure is 64.6 mmHg. Left Atrium: Left atrial size was normal in size. Right Atrium: Right atrial size was severely dilated. Pericardium: There is no evidence of pericardial effusion. Mitral Valve: The mitral valve is grossly normal. Trivial mitral valve regurgitation. MV peak gradient, 3.8 mmHg. The mean mitral valve gradient is 1.0 mmHg. Tricuspid Valve: The tricuspid valve is abnormal. Tricuspid valve regurgitation is moderate to severe. Aortic Valve: The aortic valve is tricuspid. Aortic valve regurgitation is not visualized. Aortic valve mean gradient measures 8.0 mmHg. Aortic valve peak gradient measures 14.9 mmHg. Aortic valve area, by VTI measures 2.52 cm. Pulmonic Valve: The pulmonic valve was normal in structure. Pulmonic valve regurgitation is not visualized. Aorta: The aortic root and ascending aorta are structurally normal, with no evidence of dilitation. Venous: The inferior vena cava is dilated in size with less than 50% respiratory variability, suggesting right atrial pressure of 15 mmHg. IAS/Shunts: The interatrial septum was not well visualized.  LEFT VENTRICLE PLAX 2D                        Biplane EF (MOD) LVIDd:         5.10 cm         LV Biplane EF:   Left LVIDs:         3.60 cm                          ventricular LV PW:         1.10 cm  ejection LV IVS:        0.90 cm                          fraction by LVOT diam:     2.00 cm                          2D MOD LV SV:         70                               biplane is LV SV Index:   33                                55.8 %. LVOT Area:     3.14 cm                                Diastology                                LV e' medial:    5.98 cm/s LV Volumes (MOD)               LV E/e' medial:  11.4 LV vol d, MOD    78.9 ml       LV e' lateral:   5.66 cm/s A2C:                           LV E/e' lateral: 12.0 LV vol d, MOD    81.3 ml A4C: LV vol s, MOD    44.3 ml A2C: LV vol s, MOD    26.1 ml A4C: LV SV MOD A2C:   34.6 ml LV SV MOD A4C:   81.3 ml LV SV MOD BP:    44.5 ml RIGHT VENTRICLE             IVC RV Basal diam:  4.20 cm     IVC diam: 2.50 cm RV Mid diam:    4.10 cm RV S prime:     10.90 cm/s TAPSE (M-mode): 1.8 cm LEFT ATRIUM             Index        RIGHT ATRIUM           Index LA Vol (A2C):   64.3 ml 30.13 ml/m  RA Area:     32.80 cm LA Vol (A4C):   40.4 ml 18.93 ml/m  RA Volume:   128.00 ml 59.97 ml/m LA Biplane Vol: 56.3 ml 26.38 ml/m  AORTIC VALVE                     PULMONIC VALVE AV Area (Vmax):    2.02 cm      PV Vmax:       0.60 m/s AV Area (Vmean):   2.09 cm      PV Peak grad:  1.4 mmHg AV Area (VTI):     2.52 cm AV Vmax:           193.00 cm/s AV Vmean:          125.000 cm/s AV VTI:  0.279 m AV Peak Grad:      14.9 mmHg AV Mean Grad:      8.0 mmHg LVOT Vmax:         124.00 cm/s LVOT Vmean:        83.100 cm/s LVOT VTI:          0.224 m LVOT/AV VTI ratio: 0.80  AORTA Ao Root diam: 2.50 cm Ao Asc diam:  2.90 cm MITRAL VALVE               TRICUSPID VALVE MV Area (PHT): 3.34 cm    TR Peak grad:   49.6 mmHg MV Area VTI:   2.76 cm    TR Vmax:        352.00 cm/s MV Peak grad:  3.8 mmHg MV Mean grad:  1.0 mmHg    SHUNTS MV Vmax:       0.97 m/s    Systemic VTI:  0.22 m MV Vmean:      55.8 cm/s   Systemic Diam: 2.00 cm MV Decel Time: 227 msec MV E velocity: 68.10 cm/s MV A velocity: 81.20 cm/s MV E/A ratio:  0.84 Zoila Shutter MD Electronically signed by Zoila Shutter MD Signature Date/Time: 02/05/2023/2:38:24 PM    Final    US RENAL Result Date: 02/05/2023 CLINICAL DATA:  Acute kidney injury  EXAM: RENAL / URINARY TRACT ULTRASOUND COMPLETE COMPARISON:  CT 09/23/2020 FINDINGS: Right Kidney: Renal measurements: 10.3 x 4 x 6.6 = volume: 141 mL. Echogenicity within normal limits. No mass or hydronephrosis visualized. Left Kidney: Renal measurements: 11.7 x 5.2 x 5.7 = volume: 180 mL. Echogenicity within normal limits. No mass or hydronephrosis visualized. Bladder: Appears normal for degree of bladder distention. Other: None. IMPRESSION: Negative. Electronically Signed   By: Corlis Leak M.D.   On: 02/05/2023 08:07   DG Chest Portable 1 View Result Date: 02/04/2023 CLINICAL DATA:  CHF EXAM: PORTABLE CHEST 1 VIEW COMPARISON:  09/28/2020 FINDINGS: Cardiomegaly with mild central congestion. Probable trace pleural effusions. No focal consolidation or pneumothorax. Atelectasis or scarring at the right base IMPRESSION: Cardiomegaly with mild central congestion and probable trace pleural effusions. Electronically Signed   By: Jasmine Pang M.D.   On: 02/04/2023 20:52    Cardiac Studies   TTE  1. Left ventricular ejection fraction, by estimation, is 55 to 60%. Left  ventricular ejection fraction by 2D MOD biplane is 55.8 %. The left  ventricle has normal function. The left ventricle has no regional wall  motion abnormalities. Left ventricular  diastolic parameters are consistent with Grade I diastolic dysfunction  (impaired relaxation). There is the interventricular septum is flattened  in systole, consistent with right ventricular pressure overload.   2. Right ventricular systolic function is low normal. The right  ventricular size is moderately enlarged. There is severely elevated  pulmonary artery systolic pressure. The estimated right ventricular  systolic pressure is 64.6 mmHg.   3. Right atrial size was severely dilated.   4. The mitral valve is grossly normal. Trivial mitral valve  regurgitation.   5. The tricuspid valve is abnormal. Tricuspid valve regurgitation is  moderate to severe.    6. The aortic valve is tricuspid. Aortic valve regurgitation is not  visualized.   7. The inferior vena cava is dilated in size with <50% respiratory  variability, suggesting right atrial pressure of 15 mmHg.   Patient Profile     58 y.o. female with ITP postsplenectomy, hypertension, BKA, heart failure, CVA, CKD presented to the hospital  with heart failure  Assessment & Plan    1.  Acute on chronic diastolic heart failure: BNP was elevated on admission.  Currently on IV Lasix.  Potentially not accurate due to patient in the emergency room.  Jaquana Geiger continue diuresis.  Creatinine mildly improved.  2.  Chest pain with elevated troponin: Likely related to ongoing issues.  With renal function, Dawnelle Warman hold off on ischemic evaluation  3.  Hypertension: Elevated today.  Has been well-controlled since she has been in the hospital.  4.  Acute on chronic CKD stage III: Continue with diuresis.       For questions or updates, please contact [facility] Please consult [email or web address] for contact info under        Signed, Choice Kleinsasser Jorja Loa, MD  02/06/2023, 11:53 AM

## 2023-02-07 ENCOUNTER — Encounter (HOSPITAL_COMMUNITY): Payer: Self-pay | Admitting: Internal Medicine

## 2023-02-07 ENCOUNTER — Inpatient Hospital Stay (HOSPITAL_COMMUNITY): Payer: Medicare Other

## 2023-02-07 DIAGNOSIS — N179 Acute kidney failure, unspecified: Secondary | ICD-10-CM | POA: Diagnosis not present

## 2023-02-07 DIAGNOSIS — R609 Edema, unspecified: Secondary | ICD-10-CM | POA: Diagnosis not present

## 2023-02-07 DIAGNOSIS — I5033 Acute on chronic diastolic (congestive) heart failure: Secondary | ICD-10-CM | POA: Diagnosis not present

## 2023-02-07 DIAGNOSIS — I5043 Acute on chronic combined systolic (congestive) and diastolic (congestive) heart failure: Secondary | ICD-10-CM | POA: Diagnosis not present

## 2023-02-07 LAB — HEPATIC FUNCTION PANEL
ALT: 15 U/L (ref 0–44)
AST: 25 U/L (ref 15–41)
Albumin: 2.5 g/dL — ABNORMAL LOW (ref 3.5–5.0)
Alkaline Phosphatase: 169 U/L — ABNORMAL HIGH (ref 38–126)
Bilirubin, Direct: 0.3 mg/dL — ABNORMAL HIGH (ref 0.0–0.2)
Indirect Bilirubin: 0.4 mg/dL (ref 0.3–0.9)
Total Bilirubin: 0.7 mg/dL (ref 0.0–1.2)
Total Protein: 6.7 g/dL (ref 6.5–8.1)

## 2023-02-07 LAB — CBC
HCT: 26.4 % — ABNORMAL LOW (ref 36.0–46.0)
Hemoglobin: 8.9 g/dL — ABNORMAL LOW (ref 12.0–15.0)
MCH: 30.5 pg (ref 26.0–34.0)
MCHC: 33.7 g/dL (ref 30.0–36.0)
MCV: 90.4 fL (ref 80.0–100.0)
Platelets: 201 10*3/uL (ref 150–400)
RBC: 2.92 MIL/uL — ABNORMAL LOW (ref 3.87–5.11)
RDW: 17.1 % — ABNORMAL HIGH (ref 11.5–15.5)
WBC: 7.3 10*3/uL (ref 4.0–10.5)
nRBC: 6.7 % — ABNORMAL HIGH (ref 0.0–0.2)

## 2023-02-07 LAB — MAGNESIUM: Magnesium: 2 mg/dL (ref 1.7–2.4)

## 2023-02-07 LAB — BASIC METABOLIC PANEL
Anion gap: 9 (ref 5–15)
BUN: 56 mg/dL — ABNORMAL HIGH (ref 6–20)
CO2: 16 mmol/L — ABNORMAL LOW (ref 22–32)
Calcium: 6.2 mg/dL — CL (ref 8.9–10.3)
Chloride: 107 mmol/L (ref 98–111)
Creatinine, Ser: 2.23 mg/dL — ABNORMAL HIGH (ref 0.44–1.00)
GFR, Estimated: 25 mL/min — ABNORMAL LOW (ref >60)
Glucose, Bld: 132 mg/dL — ABNORMAL HIGH (ref 70–99)
Potassium: 4.1 mmol/L (ref 3.5–5.1)
Sodium: 132 mmol/L — ABNORMAL LOW (ref 135–145)

## 2023-02-07 LAB — D-DIMER, QUANTITATIVE: D-Dimer, Quant: 3.48 ug{FEU}/mL — ABNORMAL HIGH (ref 0.00–0.50)

## 2023-02-07 MED ORDER — OXYCODONE HCL 5 MG PO TABS
5.0000 mg | ORAL_TABLET | ORAL | Status: DC | PRN
  Administered 2023-02-07 – 2023-02-18 (×7): 5 mg via ORAL
  Filled 2023-02-07 (×8): qty 1

## 2023-02-07 MED ORDER — HEPARIN BOLUS VIA INFUSION
2000.0000 [IU] | Freq: Once | INTRAVENOUS | Status: AC
Start: 2023-02-07 — End: 2023-02-07
  Administered 2023-02-07: 2000 [IU] via INTRAVENOUS
  Filled 2023-02-07: qty 2000

## 2023-02-07 MED ORDER — HEPARIN (PORCINE) 25000 UT/250ML-% IV SOLN
1850.0000 [IU]/h | INTRAVENOUS | Status: DC
  Administered 2023-02-07 – 2023-02-08 (×2): 1350 [IU]/h via INTRAVENOUS
  Administered 2023-02-09: 1500 [IU]/h via INTRAVENOUS
  Administered 2023-02-10: 1900 [IU]/h via INTRAVENOUS
  Administered 2023-02-10: 1950 [IU]/h via INTRAVENOUS
  Administered 2023-02-11: 1850 [IU]/h via INTRAVENOUS
  Filled 2023-02-07 (×6): qty 250

## 2023-02-07 MED ORDER — CALCIUM GLUCONATE-NACL 2-0.675 GM/100ML-% IV SOLN
2.0000 g | Freq: Once | INTRAVENOUS | Status: AC
  Administered 2023-02-07: 2000 mg via INTRAVENOUS
  Filled 2023-02-07: qty 100

## 2023-02-07 MED ORDER — INFLUENZA VIRUS VACC SPLIT PF (FLUZONE) 0.5 ML IM SUSY: 0.5000 mL | PREFILLED_SYRINGE | INTRAMUSCULAR | Status: DC

## 2023-02-07 NOTE — Progress Notes (Signed)
PHARMACY - ANTICOAGULATION CONSULT NOTE  Pharmacy Consult for heparin Indication: DVT  No Known Allergies  Patient Measurements: Height: 5\' 7"  (170.2 cm) Weight: 96.6 kg (213 lb) IBW/kg (Calculated) : 61.6 Heparin Dosing Weight: 85kg  Vital Signs: Temp: 97.8 F (36.6 C) (02/03 1418) Temp Source: Oral (02/03 1418) BP: 118/59 (02/03 1418) Pulse Rate: 66 (02/03 1418)  Labs: Recent Labs    02/05/23 0724 02/05/23 0925 02/06/23 0543 02/07/23 0405  HGB 9.7*  --  9.2* 8.9*  HCT 29.9*  --  28.0* 26.4*  PLT 202  --  214 201  CREATININE 2.88*  --  2.52* 2.23*  TROPONINIHS 226* 216*  --   --     Estimated Creatinine Clearance: 33.6 mL/min (A) (by C-G formula based on SCr of 2.23 mg/dL (H)).   Medical History: Past Medical History:  Diagnosis Date   Acute exacerbation of CHF (congestive heart failure) (HCC) 04/11/2019   Anemia    Arthritis    "spine, hands" (11/25/2015)   Back pain    "all my back; probably 3 times/week" (11/25/2015)   Chronic indwelling Foley catheter 03/04/2017   Diabetic foot ulcer associated with type 2 diabetes mellitus (HCC)    Hypertension    Intracerebral hemorrhage (HCC) As a teenager    States she had burst blood vessel as teenager, now with resultant minor visual field and hearing deficits   Neuropathy    Stroke (HCC) ~ 1982   "my feeling on my RLE, right lower eye vision,  & hearing out of my right ear not the same since" (11/25/2015)   Type II diabetes mellitus (HCC)     Medications:  Medications Prior to Admission  Medication Sig Dispense Refill Last Dose/Taking   acetaminophen (TYLENOL) 325 MG tablet Take 2 tablets (650 mg total) by mouth every 4 (four) hours as needed for headache or mild pain.   02/04/2023 Noon   aspirin EC 81 MG tablet Take 162 mg by mouth daily. Swallow whole.   Past Week   furosemide (LASIX) 40 MG tablet Take 1 tablet (40 mg total) by mouth 2 (two) times daily. 60 tablet 0 Past Week   gabapentin (NEURONTIN) 400 MG  capsule Take 1 capsule (400 mg total) by mouth 2 (two) times daily. 180 capsule 3 02/04/2023   isosorbide mononitrate (IMDUR) 30 MG 24 hr tablet Take 1.5 tablets (45 mg total) by mouth daily. 45 tablet 0 02/04/2023   loperamide (IMODIUM) 2 MG capsule Take 1 capsule (2 mg total) by mouth as needed for diarrhea or loose stools. 30 capsule 0 Taking As Needed   metoprolol succinate (TOPROL-XL) 50 MG 24 hr tablet Take 1 tablet (50 mg total) by mouth daily. Take with or immediately following a meal. 90 tablet 3 02/04/2023 Morning   tetrahydrozoline 0.05 % ophthalmic solution Place 2 drops into both eyes 2 (two) times daily.   02/04/2023   doxycycline (VIBRAMYCIN) 100 MG capsule Take 1 capsule (100 mg total) by mouth 2 (two) times daily. (Patient not taking: Reported on 02/05/2023) 20 capsule 0 Not Taking   Scheduled:   furosemide  40 mg Intravenous BID   gabapentin  400 mg Oral BID   Gerhardt's butt cream   Topical TID   heparin  2,000 Units Intravenous Once   [START ON 02/08/2023] influenza vac split trivalent PF  0.5 mL Intramuscular Tomorrow-1000   isosorbide mononitrate  45 mg Oral Daily   loperamide  2 mg Oral TID   metoprolol succinate  50 mg Oral  Daily   sodium bicarbonate  650 mg Oral TID   Infusions:   cefTRIAXone (ROCEPHIN)  IV Stopped (02/07/23 1036)   heparin     metronidazole Stopped (02/07/23 1138)    Assessment: Pt admitted for CHF. Doppler positive for DVT.  She has been on SQ heparin.   Hgb 8.9, plt wnl  Goal of Therapy:  Heparin level 0.3-0.7 units/ml Monitor platelets by anticoagulation protocol: Yes   Plan:  Dc SQ heparin Heparin bolus 2000 units x1 Heparin infusion 1350 units/hr Check 8 hr HL Daily heparin level and CBC F/u with oral AC  Ulyses Southward, PharmD, BCIDP, AAHIVP, CPP Infectious Disease Pharmacist 02/07/2023 5:17 PM

## 2023-02-07 NOTE — TOC Initial Note (Signed)
Transition of Care [facility]) - Initial/Assessment Note    Patient Details  Name: [name] MRN: [id number] Date of Birth: [birth year]  Transition of Care [facility]) CM/SW Contact:    Delilah Shan, LCSWA Phone Number: 02/07/2023, 4:35 PM  Clinical Narrative:                  CSW received consult for possible SNF placement at time of discharge. CSW spoke with patient at bedside regarding PT recommendation of SNF placement at time of discharge. Patient reports PTA she comes from home with son. Patient expressed understanding of PT recommendation and politely declined SNF placement at time of discharge. Patient confirmed her plan is to return home and would like to receive HHPT. CSW informed patient that CM will follow up to assist with home needs. All questions answered. No further questions reported at this time.  Expected Discharge Plan: Home w Home Health Services Barriers to Discharge: Continued Medical Work up   Patient Goals and CMS Choice Patient states their goals for this hospitalization and ongoing recovery are:: to return home with [facility] PT   Choice offered to / list presented to : Patient      Expected Discharge Plan and Services In-house Referral: Clinical Social Work Discharge Planning Services: CM Consult Post Acute Care Choice: Home Health Living arrangements for the past 2 months: Single Family Home                 DME Arranged: N/A         HH Arranged: PT, OT HH Agency: [facility] Date [facility] Agency Contacted: 02/07/23 Time HH Agency Contacted: 1632 Representative spoke with at [facility] Agency: Kandee Keen  Prior Living Arrangements/Services Living arrangements for the past 2 months: Single Family Home Lives with:: Adult Children (son works during the week with 2 days off.) Patient language and need for interpreter reviewed:: Yes Do you feel safe going back to the place where you live?: Yes      Need for Family Participation in Patient Care: Yes (Comment) Care  giver support system in place?: Yes (comment) Current home services: DME (wheelchair-unable to get another one from [employer]- purchased in 2022 can get another one in 2027, right lower ectremity prosthesis.) Criminal Activity/Legal Involvement Pertinent to Current Situation/Hospitalization: No - Comment as needed  Activities of Daily Living   ADL Screening (condition at time of admission) Independently performs ADLs?: No Does the patient have a NEW difficulty with bathing/dressing/toileting/self-feeding that is expected to last >3 days?: No Does the patient have a NEW difficulty with getting in/out of bed, walking, or climbing stairs that is expected to last >3 days?: No Does the patient have a NEW difficulty with communication that is expected to last >3 days?: No Is the patient deaf or have difficulty hearing?: No Does the patient have difficulty seeing, even when wearing glasses/contacts?: No Does the patient have difficulty concentrating, remembering, or making decisions?: No  Permission Sought/Granted Permission sought to share information with : Family Supports, Case Production designer, theatre/television/film, Oceanographer granted to share information with : Yes, Verbal Permission Granted              Emotional Assessment Appearance:: Appears stated age Attitude/Demeanor/Rapport: Engaged Affect (typically observed): Appropriate Orientation: : Oriented to Self, Oriented to Place, Oriented to  Time, Oriented to Situation Alcohol / Substance Use: Not Applicable Psych Involvement: No (comment)  Admission diagnosis:  Hypocalcemia [E83.51] AKI (acute kidney injury) (HCC) [N17.9] Acute on chronic combined systolic and  diastolic CHF (congestive heart failure) (HCC) [I50.43] Acute on chronic heart failure with preserved ejection fraction (HFpEF) (HCC) [I50.33] Patient Active Problem List   Diagnosis Date Noted   Acute on chronic heart failure with preserved ejection fraction (HFpEF) (HCC)  02/05/2023   Metabolic acidosis 02/05/2023   Low back pain 10/24/2020   Colon abnormality--cecal wall thickening 10/24/2020   Hyponatremia    Thrombocytosis    Lobar pneumonia, unspecified organism (HCC)    Bacteremia    Abnormal CT scan, colon    Iron deficiency anemia    Pancytopenia (HCC) 09/12/2020   Chronic respiratory failure with hypoxia (HCC)    Chronic ITP (idiopathic thrombocytopenia) (HCC) 03/08/2020   Acute on chronic respiratory failure with hypoxia (HCC) 03/08/2020   Cellulitis of foot 08/02/2019   Nausea and vomiting 06/26/2019   Hematuria 06/26/2019   Hypertensive urgency 06/26/2019   Acute on chronic diastolic CHF (congestive heart failure) (HCC)    CHF (congestive heart failure) (HCC) 04/11/2019   Acute exacerbation of CHF (congestive heart failure) (HCC) 04/11/2019   Leukopenia 04/11/2019   History of ITP 04/11/2019   Idiopathic thrombocytopenic purpura (ITP) (HCC) 11/16/2018   Onychomycosis of toenail 10/16/2018   Cataract of both eyes 10/16/2018   Tinea pedis of left foot 10/16/2018   Decreased vision in both eyes 10/16/2018   CKD (chronic kidney disease) stage 3, GFR 30-59 ml/min (HCC)    Cellulitis of left lower extremity 01/25/2018   Hyperkalemia 01/25/2018   Poor compliance 03/08/2017   Hypoxia 03/07/2017   Thyroid nodule 03/07/2017   Chronic indwelling Foley catheter 03/04/2017   Type II diabetes mellitus (HCC)    Femur fracture (HCC) 03/02/2017   Orthostatic dizziness    Candida UTI    Enterococcus faecalis infection    Near syncope 11/25/2015   Dehydration 11/25/2015   Orthostatic hypotension 11/25/2015   UTI (urinary tract infection) 11/25/2015   Acute worsening of stage 3 chronic kidney disease (HCC) 09/24/2015   Diabetes mellitus due to underlying condition with chronic kidney disease, without long-term current use of insulin (HCC)    Fever    Status post below knee amputation of right lower extremity (HCC)    Diabetic peripheral  neuropathy (HCC)    Neuropathic pain    Benign essential HTN    Folliculitis    Slow transit constipation    Anemia of chronic disease    Bilateral hydronephrosis    Urinary retention    CKD (chronic kidney disease)    Uncontrolled type 2 diabetes mellitus with diabetic nephropathy, without long-term current use of insulin    Vasculopathy    Debility 09/04/2015   DM type 2 with diabetic peripheral neuropathy (HCC)    S/P BKA (below knee amputation) unilateral (HCC)    Medical non-compliance    Benign skin lesion of multiple sites    Tachypnea    Acute blood loss anemia    Neurogenic bladder    Occult blood positive stool    Rectovaginal fistula    Controlled diabetes mellitus type 2 with complications (HCC)    AKI (acute kidney injury) (HCC)    Hydronephrosis determined by ultrasound    Papular rash    Uncontrolled type 2 diabetes mellitus with complication    Paresthesia    Thrombocytopenia (HCC) 08/23/2015   Pressure ulcer 08/23/2015   Severe anemia 08/23/2015   Lower urinary tract infectious disease 08/23/2015   Symptomatic anemia 08/23/2015   Peripheral vascular disease, unspecified (HCC) 04/27/2012   Unilateral complete BKA (HCC)  07/09/2011   Gas gangrene (HCC) 07/02/2011   Sepsis (HCC) 07/02/2011   Acute kidney injury superimposed on chronic kidney disease (HCC) 07/02/2011   Diabetes mellitus (HCC) 11/22/2010   Diabetic foot ulcer associated with type 2 diabetes mellitus (HCC)    HTN (hypertension)    Arthritis    Neuropathy (HCC)    Intracerebral hemorrhage (HCC)    PCP:  System, Provider Not In Pharmacy:   [facility] - [place], [place] - [address] [place] [address] Phone: [phone] Fax: [phone]  [facility] [address] [place] [address] Phone: [phone] Fax: [phone]     Social Drivers of Health (SDOH) Social History: SDOH Screenings   Food  Insecurity: Patient Unable To Answer (02/06/2023)  Housing: Unknown (02/06/2023)  Transportation Needs: No Transportation Needs (02/06/2023)  Utilities: Not At Risk (02/06/2023)  Alcohol Screen: Low Risk  (09/15/2020)  Depression (PHQ2-9): Low Risk  (04/30/2019)  Financial Resource Strain: Low Risk  (09/15/2020)  Tobacco Use: Low Risk  (02/07/2023)   SDOH Interventions:     Readmission Risk Interventions     No data to display

## 2023-02-07 NOTE — Progress Notes (Signed)
Inpatient Rehab Admissions Coordinator:   Per therapy recommendation, patient was screened for CIR candidacy by Megan Salon, MS, CCC-SLP. At this time, Pt. does not appear to demonstrate a diagnosis that is sufficient to obtain insurance auth for CIR admission, per current payor trends. I will not pursue a rehab consult for this Pt.   Recommend other rehab venues to be pursued.  Please contact me with any questions.  Megan Salon, MS, CCC-SLP Rehab Admissions Coordinator  [phone] (celll) [phone] (office)

## 2023-02-07 NOTE — Progress Notes (Signed)
Flexiseal removed d/t thickening of BMs.

## 2023-02-07 NOTE — Progress Notes (Signed)
Patient spoke with cardiology MD. Patients questions clarified. Patient agreeable to start heparin gtt.

## 2023-02-07 NOTE — Plan of Care (Signed)
  Problem: Education: Goal: Knowledge of General Education information will improve Description: Including pain rating scale, medication(s)/side effects and non-pharmacologic comfort measures Outcome: Progressing   Problem: Health Behavior/Discharge Planning: Goal: Ability to manage health-related needs will improve Outcome: Progressing   Problem: Clinical Measurements: Goal: Ability to maintain clinical measurements within normal limits will improve Outcome: Progressing Goal: Will remain free from infection Outcome: Progressing Goal: Diagnostic test results will improve Outcome: Progressing Goal: Respiratory complications will improve Outcome: Progressing Goal: Cardiovascular complication will be avoided Outcome: Progressing   Problem: Activity: Goal: Risk for activity intolerance will decrease Outcome: Progressing   Problem: Nutrition: Goal: Adequate nutrition will be maintained Outcome: Progressing   Problem: Coping: Goal: Level of anxiety will decrease Outcome: Progressing   Problem: Elimination: Goal: Will not experience complications related to bowel motility Outcome: Progressing Patient still having frequent BMs, however flexiseal removed d/t stool thickening. Goal: Will not experience complications related to urinary retention Outcome: Progressing   Problem: Pain Managment: Goal: General experience of comfort will improve and/or be controlled Outcome: Progressing   Problem: Safety: Goal: Ability to remain free from injury will improve Outcome: Progressing   Problem: Skin Integrity: Goal: Risk for impaired skin integrity will decrease Outcome: Progressing   Problem: Education: Goal: Ability to demonstrate management of disease process will improve Outcome: Progressing Goal: Ability to verbalize understanding of medication therapies will improve Outcome: Progressing Goal: Individualized Educational Video(s) Outcome: Progressing   Problem:  Activity: Goal: Capacity to carry out activities will improve Outcome: Progressing   Problem: Cardiac: Goal: Ability to achieve and maintain adequate cardiopulmonary perfusion will improve Outcome: Progressing

## 2023-02-07 NOTE — Progress Notes (Signed)
Patient would like to speak with MD prior to starting heparin gtt. Patient stated that a doctor never came and told her about having a DVT in her RLE. Patient hesitant to start heparin without speaking with MD first. Education provided by this RN. Hospitalist and Cardiology MD paged.

## 2023-02-07 NOTE — Progress Notes (Signed)
[facility]                                                                              [name], is a 58 y.o. female, DOB - April 14, 1965, ZOX:[id number] Admit date - 02/04/2023    Outpatient Primary MD for the patient is System, Provider Not In  LOS - 2  days  Chief Complaint  Patient presents with   Leg Swelling       Brief summary   Patient is a 58 year old female with ITP, diabetes mellitus type 2, CKD stage IIIa, chronic HFpEF, hypertension, CVA, ICH, PVD, right BKA, neurogenic bladder/chronic Foley presented to ED with bilateral lower extremity edema.  Patient reported 1 week of progressively worsening lower extremity edema up to her thighs.  Reported that it was getting difficult for her to get up from the wheelchair as her legs felt tight.  She had been taking Lasix 40 mg twice daily at home but not having much urine output.  Denied any shortness of breath or cough.  She also had a brief episode of left-sided chest pain.  In ED, hemoglobin 9.8, sodium 131, K5.5, bicarb 14, glucose 100, creatinine 2.7, calcium 6.4, mag 2.4, BNP 1180 Chest x-ray showed cardiomegaly with central congestion, pleural effusion. Received IV Lasix 40 mg and IV calcium gluconate 1 g.   Troponin 226 -  216  Assessment & Plan    Principal Problem:   Acute on chronic heart failure with preserved ejection fraction (HFpEF) (HCC) Elevated troponin likely due to demand ischemia - Echo 06/2020: EF 60 to 65%, mild MVR.  -presented with significant bilateral lower extremity edema up to her thighs.  BNP 1180. Troponin 226 -  216. Cr 2.7, ?cardiorenal syndrome with anasarca -2D echo shows EF of 55 to 60%, no regional WMA, right ventricular pressure overload, G1 DD, low normal right ventricular systolic function, severely elevated pulmonary artery systolic pressure. -Continue Lasix 40 mg IV twice daily, strict I's and O's and daily weights -Cardiology following.  Checking D-dimer and lower  extremity Dopplers.  Active Problems:   AKI on CKD stage IIIa, metabolic acidosis, hyperkalemia  - possibly cardiorenal in the setting of acutely decompensated CHF.  - Cr 1.2 on labs done over 2 years ago, couldn't find recent labs in care everywhere. -Continue sod bicarb 650mg  TID.  -Creatinine improving  Hypocalcemia, prolonged QTc -Calcium 6.2, albumin 2.5, corrected calcium 7.4 -Calcium gluconate 2 g IV x 1  Diarrhea -Unclear etiology, no significant abdominal pain, fevers or chills, leukocytosis.  Per patient, ongoing for about a week -C. difficile negative, GI pathogen panel negative -CT abdomen pelvis showed anasarca with diffuse mesenteric edema, otherwise no colitis or acute abdominal pathology -Placed on IV Rocephin, Flagyl, Imodium, Flexi-Seal -Diarrhea improving     Mild hypervolemic hyponatremia Continue to monitor Na, stable  Hyperkalemia -Resolved  Essential HTN (hypertension) -Continue IV Lasix, Imdur, metoprolol    Diabetes mellitus (HCC), diet controlled, -Hemoglobin A1c 5.4 -Continue follow-up   Chronic normocytic anemia -Baseline hemoglobin 9-10 -H&H stable  Obesity class II Estimated body mass index is 33.36 kg/m as calculated from the following:   Height as  of this encounter: 5\' 7"  (1.702 m).   Weight as of this encounter: 96.6 kg.  Code Status: Full code DVT Prophylaxis:  heparin injection 5,000 Units Start: 02/05/23 1400   Level of Care: Level of care: Telemetry Cardiac Family Communication: Updated patient Disposition Plan:      Remains inpatient appropriate:      Procedures:    Consultants:   Cardiology  Antimicrobials:   Anti-infectives (From admission, onward)    Start     Dose/Rate Route Frequency Ordered Stop   02/07/23 0800  cefTRIAXone (ROCEPHIN) 2 g in sodium chloride 0.9 % 100 mL IVPB        2 g 200 mL/hr over 30 Minutes Intravenous Every 24 hours 02/06/23 0713     02/06/23 0800  metroNIDAZOLE (FLAGYL) IVPB 500 mg         500 mg 100 mL/hr over 60 Minutes Intravenous Every 12 hours 02/06/23 0704     02/06/23 0715  cefTRIAXone (ROCEPHIN) 2 g in sodium chloride 0.9 % 100 mL IVPB        2 g 200 mL/hr over 30 Minutes Intravenous  Once 02/06/23 0704 02/06/23 1037          Medications  furosemide  40 mg Intravenous BID   gabapentin  400 mg Oral BID   Gerhardt's butt cream   Topical TID   heparin injection (subcutaneous)  5,000 Units Subcutaneous Q8H   [START ON 02/08/2023] influenza vac split trivalent PF  0.5 mL Intramuscular Tomorrow-1000   isosorbide mononitrate  45 mg Oral Daily   loperamide  2 mg Oral TID   metoprolol succinate  50 mg Oral Daily   sodium bicarbonate  650 mg Oral TID      Subjective:   [name] was seen and examined today.  Diarrhea improving.  Still has a Flexi-Seal.  No abdominal pain nausea vomiting or fevers.  Shortness of breath is improving.  Still persistent lower extremity swelling   Objective:   Vitals:   02/06/23 2044 02/07/23 0007 02/07/23 0415 02/07/23 0858  BP: (!) 108/90 (!) 120/57 122/61   Pulse: 74 67 71 69  Resp: (!) 22 18 16 17   Temp: 97.9 F (36.6 C) 98 F (36.7 C) 98 F (36.7 C) 97.8 F (36.6 C)  TempSrc: Oral Oral Oral Oral  SpO2: 100%  97% 98%  Weight:   96.6 kg   Height:        Intake/Output Summary (Last 24 hours) at 02/07/2023 1407 Last data filed at 02/07/2023 1154 Gross per 24 hour  Intake 1234.19 ml  Output 300 ml  Net 934.19 ml      Wt Readings from Last 3 Encounters:  02/07/23 96.6 kg  10/22/20 103 kg  10/02/20 107 kg    Physical Exam General: Alert and oriented x 3, NAD Cardiovascular: S1 S2 clear, RRR.  Respiratory: CTAB, no wheezing Gastrointestinal: Soft, nontender, nondistended, NBS Ext: right BKA, +LLE edema to thighs Neuro: no new deficits Psych: Normal affect      Data Reviewed:  I have personally reviewed following labs    CBC Lab Results  Component Value Date   WBC 7.3 02/07/2023   RBC 2.92  (L) 02/07/2023   HGB 8.9 (L) 02/07/2023   HCT 26.4 (L) 02/07/2023   MCV 90.4 02/07/2023   MCH 30.5 02/07/2023   PLT 201 02/07/2023   MCHC 33.7 02/07/2023   RDW 17.1 (H) 02/07/2023   LYMPHSABS 1.3 11/17/2020   MONOABS 0.8 11/17/2020   EOSABS  0.1 11/17/2020   BASOSABS 0.1 11/17/2020     Last metabolic panel Lab Results  Component Value Date   NA 132 (L) 02/07/2023   K 4.1 02/07/2023   CL 107 02/07/2023   CO2 16 (L) 02/07/2023   BUN 56 (H) 02/07/2023   CREATININE 2.23 (H) 02/07/2023   GLUCOSE 132 (H) 02/07/2023   GFRNONAA 25 (L) 02/07/2023   GFRAA 59 (L) 09/19/2019   CALCIUM 6.2 (LL) 02/07/2023   PHOS 2.9 10/01/2020   PROT 6.7 02/07/2023   ALBUMIN 2.5 (L) 02/07/2023   LABGLOB 4.0 05/16/2019   AGRATIO 1.1 (L) 05/16/2019   BILITOT 0.7 02/07/2023   ALKPHOS 169 (H) 02/07/2023   AST 25 02/07/2023   ALT 15 02/07/2023   ANIONGAP 9 02/07/2023    CBG (last 3)  No results for input(s): "GLUCAP" in the last 72 hours.    Coagulation Profile: No results for input(s): "INR", "PROTIME" in the last 168 hours.   Radiology Studies: I have personally reviewed the imaging studies  CT ABDOMEN PELVIS WO CONTRAST Result Date: 02/06/2023 CLINICAL DATA:  58 year old female with history of nonlocalized abdominal pain. Diarrhea. EXAM: CT ABDOMEN AND PELVIS WITHOUT CONTRAST TECHNIQUE: Multidetector CT imaging of the abdomen and pelvis was performed following the standard protocol without IV contrast. RADIATION DOSE REDUCTION: This exam was performed according to the departmental dose-optimization program which includes automated exposure control, adjustment of the mA and/or kV according to patient size and/or use of iterative reconstruction technique. COMPARISON:  CT of the abdomen and pelvis 09/23/2020. FINDINGS: Lower chest: Trace left pleural effusion. Mild scarring in the visualized lung bases. Cardiomegaly. Hepatobiliary: No definite suspicious cystic or solid hepatic lesions are confidently  identified on today's noncontrast CT examination. Small amount of high attenuation material in the lumen of the gallbladder most evident near the fundus. Gallbladder is nearly completely decompressed. Pancreas: No definite pancreatic mass or peripancreatic fluid collections or inflammatory changes are confidently identified on today's noncontrast CT examination. Spleen: Status post splenectomy. Small splenules are noted in the left upper quadrant. Adrenals/Urinary Tract: Unenhanced appearance of the kidneys bilaterally is unremarkable. No hydroureteronephrosis. Urinary bladder is unremarkable in appearance. Bilateral adrenal glands are normal in appearance. Stomach/Bowel: Unenhanced appearance of the stomach is normal. No pathologic dilatation of small bowel or colon. Rectal bag noted. Normal appendix. Vascular/Lymphatic: Atherosclerosis in the abdominal and pelvic vasculature. No lymphadenopathy noted in the abdomen or pelvis. Reproductive: Uterus and ovaries are atrophic and otherwise unremarkable in appearance. Other: Trace volume of ascites. Mild diffuse mesenteric edema. No pneumoperitoneum. Musculoskeletal: Mild diffuse body wall edema. There are no aggressive appearing lytic or blastic lesions noted in the visualized portions of the skeleton. IMPRESSION: 1. No definite acute findings are noted in the abdomen or pelvis to account for the patient's symptoms. 2. Trace left pleural effusion. Trace volume of ascites. Mild diffuse mesenteric edema. Mild diffuse body wall edema. Findings are suggestive of anasarca. 3. Cardiomegaly. 4. Atherosclerosis. Electronically Signed   By: Trudie Reed M.D.   On: 02/06/2023 08:53       Jet Armbrust M.D. [facility] 02/07/2023, 2:07 PM  Available via Epic secure chat 7am-7pm After 7 pm, please refer to night coverage provider listed on amion.

## 2023-02-07 NOTE — Progress Notes (Signed)
Patient Name: [name] Date of Encounter: 02/07/2023 [facility] Cardiologist: Parke Poisson, MD   Interval Summary  .    Feeling well.  Notes persistent LE edema but improved.   Vital Signs .    Vitals:   02/06/23 2044 02/07/23 0007 02/07/23 0415 02/07/23 0858  BP: (!) 108/90 (!) 120/57 122/61   Pulse: 74 67 71 69  Resp: (!) 22 18 16 17   Temp: 97.9 F (36.6 C) 98 F (36.7 C) 98 F (36.7 C) 97.8 F (36.6 C)  TempSrc: Oral Oral Oral Oral  SpO2: 100%  97% 98%  Weight:   96.6 kg   Height:        Intake/Output Summary (Last 24 hours) at 02/07/2023 1006 Last data filed at 02/07/2023 1003 Gross per 24 hour  Intake 194.19 ml  Output 300 ml  Net -105.81 ml      02/07/2023    4:15 AM 02/04/2023    7:21 PM 11/17/2020    2:37 PM  Last 3 Weights  Weight (lbs) 213 lb 227 lb 1.2 oz --  Weight (kg) 96.616 kg 103 kg --      Telemetry/ECG    3 - Personally Reviewed  Echo 02/05/23: 1. Left ventricular ejection fraction, by estimation, is 55 to 60%. Left  ventricular ejection fraction by 2D MOD biplane is 55.8 %. The left  ventricle has normal function. The left ventricle has no regional wall  motion abnormalities. Left ventricular  diastolic parameters are consistent with Grade I diastolic dysfunction  (impaired relaxation). There is the interventricular septum is flattened  in systole, consistent with right ventricular pressure overload.   2. Right ventricular systolic function is low normal. The right  ventricular size is moderately enlarged. There is severely elevated  pulmonary artery systolic pressure. The estimated right ventricular  systolic pressure is 64.6 mmHg.   3. Right atrial size was severely dilated.   4. The mitral valve is grossly normal. Trivial mitral valve  regurgitation.   5. The tricuspid valve is abnormal. Tricuspid valve regurgitation is  moderate to severe.   6. The aortic valve is tricuspid. Aortic valve regurgitation is not   visualized.   7. The inferior vena cava is dilated in size with <50% respiratory  variability, suggesting right atrial pressure of 15 mmHg.    Physical Exam .    VS:  BP 122/61 (BP Location: Left Arm)   Pulse 69   Temp 97.8 F (36.6 C) (Oral)   Resp 17   Ht 5\' 7"  (1.702 m)   Wt 96.6 kg   LMP  (LMP Unknown)   SpO2 98%   BMI 33.36 kg/m  , BMI Body mass index is 33.36 kg/m. GENERAL:  Well appearing HEENT: Pupils equal round and reactive, fundi not visualized, oral mucosa unremarkable NECK:  No jugular venous distention, waveform within normal limits, carotid upstroke brisk and symmetric, no bruits, no thyromegaly LUNGS:  Clear to auscultation bilaterally HEART:  RRR.  PMI not displaced or sustained,S1 and S2 within normal limits, no S3, no S4, no clicks, no rubs, no murmurs ABD:  Flat, positive bowel sounds normal in frequency in pitch, no bruits, no rebound, no guarding, no midline pulsatile mass, no hepatomegaly, no splenomegaly EXT: R BKA.  + LE edema, no cyanosis no clubbing SKIN:  No rashes no nodules NEURO:  Cranial nerves II through XII grossly intact, motor grossly intact throughout PSYCH:  Cognitively intact, oriented to person place and time  Assessment &  Plan .     [name] is a 28F with ITP s/p splenectomy, hypertension, HFpEF, CVA and CKD4 admitted with acute on chronic HFpEF and chest pain.  # Acute on chronic HFpEF:  She presented to the hospital with 4 to 6 weeks of lower extremity edema in the setting of running out of her Lasix.  She resumed it prior to admission but had poor urinary response.  BNP was 1181.  Chest x-ray consistent with vascular congestion.  Systolic function was preserved but RV function was low normal.  Severely elevated pulmonary pressures.  In/Out not well-recorded given diarrhea and stool incontinence.  Renal function continues to improve with diuresis.  Right ventricular dysfunction is new.  Given her renal dysfunction prefer not to get a  chest CTA to rule out pulmonary embolism.  Chest x-ray abnormalities will also make a V/Q scan less sensitive.  I will check a D-dimer and also lower extremity Dopplers.  # Chest pain: # Elevated troponin: High sensitivity troponin 24 --> 26 --> 216.  Chest pain was somewhat atypical.  EKG is without acute ischemic changes.  Will need to determine her ischemic evaluation based on where her renal function lands after diuresis.  Continue aspirin, Imdur, and metoprolol for now.  # Acute on chronic kidney disease: Creatinine on admission was 2.7.  Baseline is 1.3.  She does seem to be improving with diuresis.  For questions or updates, please contact [facility] Please consult [email or web address] for contact info under        Signed, Chilton Si, MD

## 2023-02-07 NOTE — TOC Initial Note (Signed)
Transition of Care [facility]) - Initial/Assessment Note    Patient Details  Name: [name] MRN: [id number] Date of Birth: October 20, 1965  Transition of [facility]) CM/SW Contact:    Gala Lewandowsky, RN Phone Number: 02/07/2023, 4:33 PM  Clinical Narrative: Patient presented for bilateral lower extremity edema. PTA patient was from home with son and she states he works during the day. Patient has wheelchair from [employer] and is unable to trade out at this time. Patient obtained wheelchair in 2022 and cannot replace until 2027. Patient has prosthesis in the home. CSW had spoken with patient regarding SNF and the patient declined services. Patient wants to return home once stable with home health services. Patient did not have a preference and referral submitted to [facility] for RN/PT/OT-will need resumption orders and F2F. Case Manager will continue to follow for additional needs as the patient progresses.                    Expected Discharge Plan: Home w Home Health Services Barriers to Discharge: Continued Medical Work up   Patient Goals and CMS Choice Patient states their goals for this hospitalization and ongoing recovery are:: to return home with [facility]   Choice offered to / list presented to : Patient      Expected Discharge Plan and Services In-house Referral: Clinical Social Work Discharge Planning Services: CM Consult Post Acute Care Choice: Home Health Living arrangements for the past 2 months: Single Family Home                 DME Arranged: N/A         HH Arranged: PT, OT HH Agency: [facility] Date [facility] Agency Contacted: 02/07/23 Time HH Agency Contacted: 1632 Representative spoke with at [facility] Agency: Kandee Keen  Prior Living Arrangements/Services Living arrangements for the past 2 months: Single Family Home Lives with:: Adult Children (son works during the week with 2 days off.) Patient language and need for interpreter reviewed:: Yes Do you feel safe  going back to the place where you live?: Yes      Need for Family Participation in Patient Care: Yes (Comment) Care giver support system in place?: Yes (comment) Current home services: DME (wheelchair-unable to get another one from [employer]- purchased in 2022 can get another one in 2027, right lower ectremity prosthesis.) Criminal Activity/Legal Involvement Pertinent to Current Situation/Hospitalization: No - Comment as needed  Activities of Daily Living   ADL Screening (condition at time of admission) Independently performs ADLs?: No Does the patient have a NEW difficulty with bathing/dressing/toileting/self-feeding that is expected to last >3 days?: No Does the patient have a NEW difficulty with getting in/out of bed, walking, or climbing stairs that is expected to last >3 days?: No Does the patient have a NEW difficulty with communication that is expected to last >3 days?: No Is the patient deaf or have difficulty hearing?: No Does the patient have difficulty seeing, even when wearing glasses/contacts?: No Does the patient have difficulty concentrating, remembering, or making decisions?: No  Permission Sought/Granted Permission sought to share information with : Family Supports, Case Production designer, theatre/television/film, Oceanographer granted to share information with : Yes, Verbal Permission Granted              Emotional Assessment Appearance:: Appears stated age Attitude/Demeanor/Rapport: Engaged Affect (typically observed): Appropriate Orientation: : Oriented to Self, Oriented to Place, Oriented to  Time, Oriented to Situation Alcohol / Substance Use: Not Applicable Psych Involvement:  No (comment)  Admission diagnosis:  Hypocalcemia [E83.51] AKI (acute kidney injury) (HCC) [N17.9] Acute on chronic combined systolic and diastolic CHF (congestive heart failure) (HCC) [I50.43] Acute on chronic heart failure with preserved ejection fraction (HFpEF) (HCC) [I50.33] Patient Active  Problem List   Diagnosis Date Noted   Acute on chronic heart failure with preserved ejection fraction (HFpEF) (HCC) 02/05/2023   Metabolic acidosis 02/05/2023   Low back pain 10/24/2020   Colon abnormality--cecal wall thickening 10/24/2020   Hyponatremia    Thrombocytosis    Lobar pneumonia, unspecified organism (HCC)    Bacteremia    Abnormal CT scan, colon    Iron deficiency anemia    Pancytopenia (HCC) 09/12/2020   Chronic respiratory failure with hypoxia (HCC)    Chronic ITP (idiopathic thrombocytopenia) (HCC) 03/08/2020   Acute on chronic respiratory failure with hypoxia (HCC) 03/08/2020   Cellulitis of foot 08/02/2019   Nausea and vomiting 06/26/2019   Hematuria 06/26/2019   Hypertensive urgency 06/26/2019   Acute on chronic diastolic CHF (congestive heart failure) (HCC)    CHF (congestive heart failure) (HCC) 04/11/2019   Acute exacerbation of CHF (congestive heart failure) (HCC) 04/11/2019   Leukopenia 04/11/2019   History of ITP 04/11/2019   Idiopathic thrombocytopenic purpura (ITP) (HCC) 11/16/2018   Onychomycosis of toenail 10/16/2018   Cataract of both eyes 10/16/2018   Tinea pedis of left foot 10/16/2018   Decreased vision in both eyes 10/16/2018   CKD (chronic kidney disease) stage 3, GFR 30-59 ml/min (HCC)    Cellulitis of left lower extremity 01/25/2018   Hyperkalemia 01/25/2018   Poor compliance 03/08/2017   Hypoxia 03/07/2017   Thyroid nodule 03/07/2017   Chronic indwelling Foley catheter 03/04/2017   Type II diabetes mellitus (HCC)    Femur fracture (HCC) 03/02/2017   Orthostatic dizziness    Candida UTI    Enterococcus faecalis infection    Near syncope 11/25/2015   Dehydration 11/25/2015   Orthostatic hypotension 11/25/2015   UTI (urinary tract infection) 11/25/2015   Acute worsening of stage 3 chronic kidney disease (HCC) 09/24/2015   Diabetes mellitus due to underlying condition with chronic kidney disease, without long-term current use of insulin  (HCC)    Fever    Status post below knee amputation of right lower extremity (HCC)    Diabetic peripheral neuropathy (HCC)    Neuropathic pain    Benign essential HTN    Folliculitis    Slow transit constipation    Anemia of chronic disease    Bilateral hydronephrosis    Urinary retention    CKD (chronic kidney disease)    Uncontrolled type 2 diabetes mellitus with diabetic nephropathy, without long-term current use of insulin    Vasculopathy    Debility 09/04/2015   DM type 2 with diabetic peripheral neuropathy (HCC)    S/P BKA (below knee amputation) unilateral (HCC)    Medical non-compliance    Benign skin lesion of multiple sites    Tachypnea    Acute blood loss anemia    Neurogenic bladder    Occult blood positive stool    Rectovaginal fistula    Controlled diabetes mellitus type 2 with complications (HCC)    AKI (acute kidney injury) (HCC)    Hydronephrosis determined by ultrasound    Papular rash    Uncontrolled type 2 diabetes mellitus with complication    Paresthesia    Thrombocytopenia (HCC) 08/23/2015   Pressure ulcer 08/23/2015   Severe anemia 08/23/2015   Lower urinary tract infectious disease  08/23/2015   Symptomatic anemia 08/23/2015   Peripheral vascular disease, unspecified (HCC) 04/27/2012   Unilateral complete BKA (HCC) 07/09/2011   Gas gangrene (HCC) 07/02/2011   Sepsis (HCC) 07/02/2011   Acute kidney injury superimposed on chronic kidney disease (HCC) 07/02/2011   Diabetes mellitus (HCC) 11/22/2010   Diabetic foot ulcer associated with type 2 diabetes mellitus (HCC)    HTN (hypertension)    Arthritis    Neuropathy (HCC)    Intracerebral hemorrhage (HCC)    PCP:  System, Provider Not In Pharmacy:   [facility] - [place], [place] - [address] [place] [address] Phone: [phone] Fax: [phone]  [facility] [address] [place] [address] Phone: [phone]  Fax: [phone]     Social Drivers of Health (SDOH) Social History: SDOH Screenings   Food Insecurity: Patient Unable To Answer (02/06/2023)  Housing: Unknown (02/06/2023)  Transportation Needs: No Transportation Needs (02/06/2023)  Utilities: Not At Risk (02/06/2023)  Alcohol Screen: Low Risk  (09/15/2020)  Depression (PHQ2-9): Low Risk  (04/30/2019)  Financial Resource Strain: Low Risk  (09/15/2020)  Tobacco Use: Low Risk  (02/07/2023)   SDOH Interventions:     Readmission Risk Interventions     No data to display

## 2023-02-07 NOTE — Plan of Care (Signed)
Problem: Education: Goal: Knowledge of General Education information will improve Description: Including pain rating scale, medication(s)/side effects and non-pharmacologic comfort measures 02/07/2023 1805 by Juluis Mire, RN Outcome: Progressing 02/07/2023 1614 by Juluis Mire, RN Outcome: Progressing   Problem: Health Behavior/Discharge Planning: Goal: Ability to manage health-related needs will improve 02/07/2023 1805 by Juluis Mire, RN Outcome: Progressing 02/07/2023 1614 by Juluis Mire, RN Outcome: Progressing   Problem: Clinical Measurements: Goal: Ability to maintain clinical measurements within normal limits will improve 02/07/2023 1805 by Juluis Mire, RN Outcome: Progressing 02/07/2023 1614 by Juluis Mire, RN Outcome: Progressing Goal: Will remain free from infection 02/07/2023 1805 by Juluis Mire, RN Outcome: Progressing 02/07/2023 1614 by Juluis Mire, RN Outcome: Progressing Goal: Diagnostic test results will improve 02/07/2023 1805 by Juluis Mire, RN Outcome: Progressing 02/07/2023 1614 by Juluis Mire, RN Outcome: Progressing Goal: Respiratory complications will improve 02/07/2023 1805 by Juluis Mire, RN Outcome: Progressing 02/07/2023 1614 by Juluis Mire, RN Outcome: Progressing Goal: Cardiovascular complication will be avoided 02/07/2023 1805 by Juluis Mire, RN Outcome: Progressing 02/07/2023 1614 by Juluis Mire, RN Outcome: Progressing   Problem: Activity: Goal: Risk for activity intolerance will decrease 02/07/2023 1805 by Juluis Mire, RN Outcome: Progressing 02/07/2023 1614 by Juluis Mire, RN Outcome: Progressing   Problem: Nutrition: Goal: Adequate nutrition will be maintained 02/07/2023 1805 by Juluis Mire, RN Outcome: Progressing 02/07/2023 1614 by Juluis Mire, RN Outcome: Progressing   Problem: Coping: Goal: Level of anxiety will  decrease 02/07/2023 1805 by Juluis Mire, RN Outcome: Progressing 02/07/2023 1614 by Juluis Mire, RN Outcome: Progressing   Problem: Elimination: Goal: Will not experience complications related to bowel motility 02/07/2023 1805 by Juluis Mire, RN Outcome: Progressing 02/07/2023 1614 by Juluis Mire, RN Outcome: Progressing Goal: Will not experience complications related to urinary retention 02/07/2023 1805 by Juluis Mire, RN Outcome: Progressing 02/07/2023 1614 by Juluis Mire, RN Outcome: Progressing   Problem: Pain Managment: Goal: General experience of comfort will improve and/or be controlled 02/07/2023 1805 by Juluis Mire, RN Outcome: Progressing 02/07/2023 1614 by Juluis Mire, RN Outcome: Progressing   Problem: Safety: Goal: Ability to remain free from injury will improve 02/07/2023 1805 by Juluis Mire, RN Outcome: Progressing 02/07/2023 1614 by Juluis Mire, RN Outcome: Progressing   Problem: Skin Integrity: Goal: Risk for impaired skin integrity will decrease 02/07/2023 1805 by Juluis Mire, RN Outcome: Progressing 02/07/2023 1614 by Juluis Mire, RN Outcome: Progressing   Problem: Education: Goal: Ability to demonstrate management of disease process will improve 02/07/2023 1805 by Juluis Mire, RN Outcome: Progressing 02/07/2023 1614 by Juluis Mire, RN Outcome: Progressing Goal: Ability to verbalize understanding of medication therapies will improve 02/07/2023 1805 by Juluis Mire, RN Outcome: Progressing 02/07/2023 1614 by Juluis Mire, RN Outcome: Progressing Goal: Individualized Educational Video(s) 02/07/2023 1805 by Juluis Mire, RN Outcome: Progressing 02/07/2023 1614 by Juluis Mire, RN Outcome: Progressing   Problem: Activity: Goal: Capacity to carry out activities will improve 02/07/2023 1805 by Juluis Mire, RN Outcome:  Progressing 02/07/2023 1614 by Juluis Mire, RN Outcome: Progressing   Problem: Cardiac: Goal: Ability to achieve and maintain adequate cardiopulmonary perfusion will improve 02/07/2023 1805 by Juluis Mire, RN Outcome: Progressing 02/07/2023 1614 by Juluis Mire, RN Outcome: Progressing   Problem: Consults Goal: Venous Thromboembolism Patient Education Description: See Patient Education Module for  education specifics. Outcome: Progressing Goal: Diagnosis - Venous Thromboembolism (VTE) Description: Choose a selection Outcome: Progressing Note: DVT (Deep Vein Thrombosis) Goal: Pharmacy Consult for anticoagulation Outcome: Progressing Goal: Skin Care Protocol Initiated - if Braden Score 18 or less Description: If consults are not indicated, leave blank or document N/A Outcome: Progressing Goal: Nutrition Consult-if indicated Outcome: Progressing Goal: Diabetes Guidelines if Diabetic/Glucose > 140 Description: If diabetic or lab glucose is > 140 mg/dl - Initiate Diabetes/Hyperglycemia Guidelines & Document Interventions  Outcome: Progressing   Problem: Phase I Progression Outcomes Goal: Pain controlled with appropriate interventions Outcome: Progressing Goal: Dyspnea controlled at rest (PE) Outcome: Progressing Goal: Tolerating diet Outcome: Progressing Goal: Initial discharge plan identified Outcome: Progressing Goal: Voiding-avoid urinary catheter unless indicated Outcome: Progressing Goal: Hemodynamically stable Outcome: Progressing Goal: Other Phase I Outcomes/Goals Outcome: Progressing   Problem: Phase II Progression Outcomes Goal: Therapeutic drug levels for anticoagulation Outcome: Progressing Goal: 02 sats trending upward/stable (PE) Outcome: Progressing Goal: Discharge plan established Outcome: Progressing Goal: Tolerating diet Outcome: Progressing Goal: Other Phase II Outcomes/Goals Outcome: Progressing   Problem: Phase III Progression  Outcomes Goal: 02 sats stabilized Outcome: Progressing Goal: Activity at appropriate level-compared to baseline Description: (UP IN CHAIR FOR HEMODIALYSIS) Outcome: Progressing Goal: Discharge plan remains appropriate-arrangements made Outcome: Progressing Goal: Other Phase III Outcomes/Goals Outcome: Progressing   Problem: Discharge Progression Outcomes Goal: Barriers To Progression Addressed/Resolved Outcome: Progressing Goal: Discharge plan in place and appropriate Outcome: Progressing Goal: Pain controlled with appropriate interventions Outcome: Progressing Goal: Hemodynamically stable Outcome: Progressing Goal: Complications resolved/controlled Outcome: Progressing Goal: Tolerating diet Outcome: Progressing Goal: Activity appropriate for discharge plan Outcome: Progressing Goal: Other Discharge Outcomes/Goals Outcome: Progressing

## 2023-02-07 NOTE — Progress Notes (Signed)
Occupational Therapy Treatment Patient Details Name: [name] MRN: [id number] DOB: 1965/02/23 Today's Date: 02/07/2023   History of present illness Pt is a 58 y.o. female who presented 02/05/23 with bil lower extremity edema. Admitted with acute on chronic diastolic heart failure. PMH: ITP, DM2, CKD stage IIIa, chronic HFpEF, HTN, CVA, ICH, PVD, right BKA, neurogenic bladder/chronic Foley   OT comments  PTA, pt lives with son who works during the day. Pt reports typically Modified Independent with ADLs, some IADLs from a wheelchair level at home. Pt is typically able to transfer self to/from wheelchair. Pt presents now with deficits in strength, balance, endurance and pain. On entry, pt crying d/t discomfort from bottom/flexiseal so evaluation somewhat limited. Pt requires Min A to roll side to side, Min A for UB ADL and Mod-Max A for LB ADLs bed level/with lateral leans. Provided UE HEP and instruction to attempt further later today to maximize strength. Also encouraged frequent repositioning/sidelying to relieve pressure from bottom. Based on current deficits, recommend continued inpatient follow up therapy, <3 hours/day though pt hopeful to progress acutely to return home at DC.       If plan is discharge home, recommend the following:  A lot of help with walking and/or transfers;A lot of help with bathing/dressing/bathroom   Equipment Recommendations  Wheelchair (measurements OT);Wheelchair cushion (measurements OT) (reports need for new wheelchair; would also benefit from gel or air cushion for wheelchair to prevent skin breakdown)    Recommendations for Other Services      Precautions / Restrictions Precautions Precautions: Fall Precaution Comments: R BKA (prosthesis not present); flexiseal Restrictions Weight Bearing Restrictions Per Provider Order: No       Mobility Bed Mobility Overal bed mobility: Needs Assistance Bed Mobility: Rolling Rolling: Min assist          General bed mobility comments: Min A to fully roll to side for application of cream on bottom and repositioning    Transfers                         Balance Overall balance assessment: Needs assistance Sitting-balance support: No upper extremity supported, Feet supported Sitting balance-Leahy Scale: Fair                                     ADL either performed or assessed with clinical judgement   ADL Overall ADL's : Needs assistance/impaired Eating/Feeding: Independent   Grooming: Set up;Sitting   Upper Body Bathing: Minimal assistance;Sitting;Bed level   Lower Body Bathing: Moderate assistance;Bed level;Sitting/lateral leans   Upper Body Dressing : Minimal assistance;Sitting;Bed level   Lower Body Dressing: Maximal assistance;Sitting/lateral leans;Bed level       Toileting- Clothing Manipulation and Hygiene: Maximal assistance;Bed level              Extremity/Trunk Assessment Upper Extremity Assessment Upper Extremity Assessment: Generalized weakness;Right hand dominant   Lower Extremity Assessment Lower Extremity Assessment: Defer to PT evaluation   Cervical / Trunk Assessment Cervical / Trunk Assessment: Normal    Vision Ability to See in Adequate Light: 0 Adequate Patient Visual Report: No change from baseline Vision Assessment?: No apparent visual deficits   Perception     Praxis      Cognition Arousal: Alert Behavior During Therapy: WFL for tasks assessed/performed, Flat affect Overall Cognitive Status: Impaired/Different from baseline Area of Impairment: Attention, Memory, Following commands, Problem solving  Current Attention Level: Selective Memory: Decreased short-term memory Following Commands: Follows one step commands consistently, Follows one step commands with increased time     Problem Solving: Slow processing, Difficulty sequencing, Requires verbal cues General Comments: tearful on  entry due to pain. pleasant, follows commands with multimodal cues, shows insight into deficits. flat affect mostly with minor decreased overall awareness noted        Exercises Exercises: General Upper Extremity General Exercises - Upper Extremity Shoulder Flexion: Strengthening, Both, 5 reps, Theraband Theraband Level (Shoulder Flexion): Level 1 (Yellow) Shoulder Horizontal ABduction: Strengthening, Both, 5 reps, Theraband Theraband Level (Shoulder Horizontal Abduction): Level 1 (Yellow) Elbow Flexion: Strengthening, Both, 5 reps, Theraband Theraband Level (Elbow Flexion): Level 1 (Yellow) Elbow Extension: Strengthening, 5 reps, Both, Theraband Theraband Level (Elbow Extension): Level 1 (Yellow)    Shoulder Instructions       General Comments      Pertinent Vitals/ Pain       Pain Assessment Pain Assessment: Faces Faces Pain Scale: Hurts whole lot Pain Location: bottom Pain Descriptors / Indicators: Crying, Grimacing, Guarding, Moaning Pain Intervention(s): Monitored during session, Limited activity within patient's tolerance, Repositioned, Other (comment) (applied cream; RN messaging MD to ask for pain meds)  Home Living Family/patient expects to be discharged to:: Private residence Living Arrangements: Children Available Help at Discharge: Family;Available PRN/intermittently Type of Home: House Home Access: Stairs to enter Entrance Stairs-Number of Steps: 1   Home Layout: Two level;Able to live on main level with bedroom/bathroom;1/2 bath on main level     Bathroom Shower/Tub: Sponge bathes at baseline   Bathroom Toilet: Standard     Home Equipment: Agricultural consultant (2 wheels);BSC/3in1;Wheelchair - manual;Hospital bed          Prior Functioning/Environment              Frequency  Min 1X/week        Progress Toward Goals  OT Goals(current goals can now be found in the care plan section)     Acute Rehab OT Goals Patient Stated Goal: decrease pain  in bottom OT Goal Formulation: With patient Time For Goal Achievement: 02/21/23 Potential to Achieve Goals: Good  Plan      Co-evaluation                 AM-PAC OT "6 Clicks" Daily Activity     Outcome Measure   Help from another person eating meals?: None Help from another person taking care of personal grooming?: A Little Help from another person toileting, which includes using toliet, bedpan, or urinal?: A Lot Help from another person bathing (including washing, rinsing, drying)?: A Lot Help from another person to put on and taking off regular upper body clothing?: A Little Help from another person to put on and taking off regular lower body clothing?: A Lot 6 Click Score: 16    End of Session    OT Visit Diagnosis: Other abnormalities of gait and mobility (R26.89);Muscle weakness (generalized) (M62.81)   Activity Tolerance Patient limited by pain   Patient Left in bed;with call bell/phone within reach;with bed alarm set   Nurse Communication Patient requests pain meds        Time: 4098-1191 OT Time Calculation (min): 26 min  Charges: OT General Charges $OT Visit: 1 Visit OT Evaluation $OT Eval Moderate Complexity: 1 Mod OT Treatments $Therapeutic Exercise: 8-22 mins  Bradd Canary, OTR/L Acute Rehab Services Office: [phone]   Lorre Munroe 02/07/2023, 12:30 PM

## 2023-02-08 ENCOUNTER — Encounter: Payer: Self-pay | Admitting: Hematology and Oncology

## 2023-02-08 ENCOUNTER — Other Ambulatory Visit (HOSPITAL_COMMUNITY): Payer: Self-pay

## 2023-02-08 ENCOUNTER — Telehealth (HOSPITAL_COMMUNITY): Payer: Self-pay | Admitting: Pharmacy Technician

## 2023-02-08 DIAGNOSIS — I5043 Acute on chronic combined systolic (congestive) and diastolic (congestive) heart failure: Secondary | ICD-10-CM | POA: Diagnosis not present

## 2023-02-08 DIAGNOSIS — I82431 Acute embolism and thrombosis of right popliteal vein: Secondary | ICD-10-CM | POA: Diagnosis not present

## 2023-02-08 DIAGNOSIS — I5033 Acute on chronic diastolic (congestive) heart failure: Secondary | ICD-10-CM | POA: Diagnosis not present

## 2023-02-08 DIAGNOSIS — N179 Acute kidney failure, unspecified: Secondary | ICD-10-CM | POA: Diagnosis not present

## 2023-02-08 LAB — ALBUMIN: Albumin: 2.7 g/dL — ABNORMAL LOW (ref 3.5–5.0)

## 2023-02-08 LAB — CBC
HCT: 26.4 % — ABNORMAL LOW (ref 36.0–46.0)
Hemoglobin: 9.1 g/dL — ABNORMAL LOW (ref 12.0–15.0)
MCH: 31.2 pg (ref 26.0–34.0)
MCHC: 34.5 g/dL (ref 30.0–36.0)
MCV: 90.4 fL (ref 80.0–100.0)
Platelets: 214 10*3/uL (ref 150–400)
RBC: 2.92 MIL/uL — ABNORMAL LOW (ref 3.87–5.11)
RDW: 16.9 % — ABNORMAL HIGH (ref 11.5–15.5)
WBC: 8.2 10*3/uL (ref 4.0–10.5)
nRBC: 3.8 % — ABNORMAL HIGH (ref 0.0–0.2)

## 2023-02-08 LAB — BASIC METABOLIC PANEL
Anion gap: 15 (ref 5–15)
BUN: 59 mg/dL — ABNORMAL HIGH (ref 6–20)
CO2: 16 mmol/L — ABNORMAL LOW (ref 22–32)
Calcium: 6.4 mg/dL — CL (ref 8.9–10.3)
Chloride: 101 mmol/L (ref 98–111)
Creatinine, Ser: 2.26 mg/dL — ABNORMAL HIGH (ref 0.44–1.00)
GFR, Estimated: 25 mL/min — ABNORMAL LOW (ref >60)
Glucose, Bld: 126 mg/dL — ABNORMAL HIGH (ref 70–99)
Potassium: 3.9 mmol/L (ref 3.5–5.1)
Sodium: 132 mmol/L — ABNORMAL LOW (ref 135–145)

## 2023-02-08 LAB — MAGNESIUM: Magnesium: 1.9 mg/dL (ref 1.7–2.4)

## 2023-02-08 LAB — VITAMIN D 25 HYDROXY (VIT D DEFICIENCY, FRACTURES): Vit D, 25-Hydroxy: 6.7 ng/mL — ABNORMAL LOW (ref 30–100)

## 2023-02-08 LAB — BRAIN NATRIURETIC PEPTIDE: B Natriuretic Peptide: 1321.8 pg/mL — ABNORMAL HIGH (ref 0.0–100.0)

## 2023-02-08 LAB — HEPARIN LEVEL (UNFRACTIONATED)
Heparin Unfractionated: 0.44 [IU]/mL (ref 0.30–0.70)
Heparin Unfractionated: 0.43 [IU]/mL (ref 0.30–0.70)

## 2023-02-08 MED ORDER — CALCITRIOL 0.25 MCG PO CAPS
0.2500 ug | ORAL_CAPSULE | Freq: Every day | ORAL | Status: DC
Start: 2023-02-08 — End: 2023-02-15
  Administered 2023-02-08 – 2023-02-14 (×7): 0.25 ug via ORAL
  Filled 2023-02-08 (×7): qty 1

## 2023-02-08 MED ORDER — LOPERAMIDE HCL 2 MG PO CAPS
2.0000 mg | ORAL_CAPSULE | Freq: Four times a day (QID) | ORAL | Status: AC | PRN
  Administered 2023-02-08 – 2023-02-09 (×3): 2 mg via ORAL
  Filled 2023-02-08 (×3): qty 1

## 2023-02-08 MED ORDER — CALCIUM CARBONATE 1250 (500 CA) MG PO TABS
1000.0000 mg | ORAL_TABLET | Freq: Three times a day (TID) | ORAL | Status: DC
Start: 2023-02-08 — End: 2023-02-19
  Administered 2023-02-08 – 2023-02-18 (×28): 2500 mg via ORAL
  Filled 2023-02-08 (×33): qty 2

## 2023-02-08 MED ORDER — CALCIUM GLUCONATE-NACL 2-0.675 GM/100ML-% IV SOLN
2.0000 g | INTRAVENOUS | Status: AC
  Administered 2023-02-08: 2000 mg via INTRAVENOUS
  Filled 2023-02-08: qty 100

## 2023-02-08 MED ORDER — VITAMIN D (ERGOCALCIFEROL) 1.25 MG (50000 UNIT) PO CAPS
50000.0000 [IU] | ORAL_CAPSULE | ORAL | Status: DC
  Administered 2023-02-08 – 2023-02-15 (×2): 50000 [IU] via ORAL
  Filled 2023-02-08 (×2): qty 1

## 2023-02-08 NOTE — Progress Notes (Signed)
REDS Clip  READING (normal 20-35%) = 41  CHEST RULER (in) =22 Clip Station =  C  Dr. Duke Salvia was secured chat the results.   Rhae Hammock, [name], Scientist, clinical (histocompatibility and immunogenetics) Only

## 2023-02-08 NOTE — Telephone Encounter (Signed)
Patient Product/process development scientist completed.    The patient is insured through [employer] Patient has Medicare and is not eligible for a copay card, but may be able to apply for patient assistance or Medicare RX Payment Plan (Patient Must reach out to their plan, if eligible for payment plan), if available.    Ran test claim for Eliquis 5 mg and the current 30 day co-pay is $420.00 due to a $375.00 deductible.  Will be $45.00 once deductible is met.  Ran test claim for Xarelto 20 mg and the current 30 day co-pay is $420.00 due to a $375.00 deductible.  Will be $45.00 once deductible is met.  Ran test claim for Farxiga 10 mg and the current 30 day co-pay is $420.00 due to a $375.00 deductible.  Will be $45.00 once deductible is met.  Ran test claim for Jardiance 10 mg and the current 30 day co-pay is $420.00 due to a $375.00 deductible.  Will be $45.00 once deductible is met.  This test claim was processed through [facility]- copay amounts may vary at other pharmacies due to pharmacy/plan contracts, or as the patient moves through the different stages of their insurance plan.     Roland Earl, CPHT Pharmacy Technician III Certified Patient Advocate [facility] Patient Advocate Team Direct Number: [phone]  Fax: [phone]

## 2023-02-08 NOTE — Progress Notes (Signed)
 Patient Name: [name] Date of Encounter: 02/08/2023 [facility] Cardiologist: Soyla DELENA Merck, MD   Interval Summary  .    Feeling well.  Notes persistent LE edema but improved.   Vital Signs .    Vitals:   02/07/23 2137 02/08/23 0030 02/08/23 0437 02/08/23 0747  BP: (!) 113/59 125/61 129/64 116/60  Pulse: 68 71 69 68  Resp: 16 18 18 18   Temp: 98.4 F (36.9 C) 98.6 F (37 C) 98.6 F (37 C) 98.4 F (36.9 C)  TempSrc: Oral Oral Oral Oral  SpO2: 96% 96% 96% 94%  Weight:   95.8 kg   Height:        Intake/Output Summary (Last 24 hours) at 02/08/2023 1033 Last data filed at 02/08/2023 0000 Gross per 24 hour  Intake 680 ml  Output 650 ml  Net 30 ml      02/08/2023    4:37 AM 02/07/2023    4:15 AM 02/04/2023    7:21 PM  Last 3 Weights  Weight (lbs) 211 lb 3.2 oz 213 lb 227 lb 1.2 oz  Weight (kg) 95.8 kg 96.616 kg 103 kg      Telemetry/ECG    Sinus rhythm - Personally Reviewed  Echo 02/05/23: 1. Left ventricular ejection fraction, by estimation, is 55 to 60%. Left  ventricular ejection fraction by 2D MOD biplane is 55.8 %. The left  ventricle has normal function. The left ventricle has no regional wall  motion abnormalities. Left ventricular  diastolic parameters are consistent with Grade I diastolic dysfunction  (impaired relaxation). There is the interventricular septum is flattened  in systole, consistent with right ventricular pressure overload.   2. Right ventricular systolic function is low normal. The right  ventricular size is moderately enlarged. There is severely elevated  pulmonary artery systolic pressure. The estimated right ventricular  systolic pressure is 64.6 mmHg.   3. Right atrial size was severely dilated.   4. The mitral valve is grossly normal. Trivial mitral valve  regurgitation.   5. The tricuspid valve is abnormal. Tricuspid valve regurgitation is  moderate to severe.   6. The aortic valve is tricuspid. Aortic valve  regurgitation is not  visualized.   7. The inferior vena cava is dilated in size with <50% respiratory  variability, suggesting right atrial pressure of 15 mmHg.   LE Venous Doppler 02/07/23: Summary:  BILATERAL:  -No evidence of popliteal cyst, bilaterally.   -Subcutaneous edema, bilaterally  RIGHT:   - Findings consistent with age indeterminate deep vein thrombosis  involving the right popliteal vein.  Ultrasound characteristics of enlarged lymph nodes are noted in the groin.    LEFT:   - There is no evidence of deep vein thrombosis in the lower extremity.  - Ultrasound characteristics of enlarged lymph nodes noted in the groin.   Physical Exam .    VS:  BP 116/60 (BP Location: Left Arm)   Pulse 68   Temp 98.4 F (36.9 C) (Oral)   Resp 18   Ht 5' 7 (1.702 m)   Wt 95.8 kg   LMP  (LMP Unknown)   SpO2 94%   BMI 33.08 kg/m  , BMI Body mass index is 33.08 kg/m. GENERAL:  Well appearing HEENT: Pupils equal round and reactive, fundi not visualized, oral mucosa unremarkable NECK:  No jugular venous distention, waveform within normal limits, carotid upstroke brisk and symmetric, no bruits, no thyromegaly LUNGS:  Clear to auscultation bilaterally HEART:  RRR.  PMI not  displaced or sustained,S1 and S2 within normal limits, no S3, no S4, no clicks, no rubs, no murmurs ABD:  Flat, positive bowel sounds normal in frequency in pitch, no bruits, no rebound, no guarding, no midline pulsatile mass, no hepatomegaly, no splenomegaly EXT: R BKA.  + dependent LE edema, no cyanosis no clubbing SKIN:  No rashes no nodules NEURO:  Cranial nerves II through XII grossly intact, motor grossly intact throughout PSYCH:  Cognitively intact, oriented to person place and time  Assessment & Plan .     Ms. Minardi is a 33F with ITP s/p splenectomy, hypertension, HFpEF, CVA and CKD4 admitted with acute on chronic HFpEF and chest pain.  # Acute on chronic HFpEF:  She presented to the hospital with 4  to 6 weeks of lower extremity edema in the setting of running out of her Lasix .  She resumed it prior to admission but had poor urinary response.  BNP was 1181 on admission.  Chest x-ray consistent with vascular congestion.  Systolic function was preserved but RV function was low normal. Weight is decreasing with diuresis.  Renal function has plateaued.  Severely elevated pulmonary pressures.  In/Out not well-recorded given diarrhea and stool incontinence. Right ventricular dysfunction is new.    LE Doppler was positivie and she likely has a PE.  Now on anticoagulation.  She has R>L dependent edema.  Unclear how much is 2/2 the DVT and how much needs more aggressive diuresis despite lack of improvement in renal function.  Will check BNP an REDS Vest to further guide diuresis.   # Chest pain: # Elevated troponin: High sensitivity troponin 24 --> 26 --> 216.  Chest pain was somewhat atypical.  EKG is without acute ischemic changes.  Continue aspirin , Imdur , and metoprolol  for now.  Given her new finding of DVT/PE and atypical symptoms with preserved systolic function, no plan for inpatient ischemia evaluation.   # Acute on chronic kidney disease: Creatinine on admission was 2.7.  Baseline is 1.3.  She does seem to be improving with diuresis.  For questions or updates, please contact [facility] Please consult [email or web address] for contact info under        Signed, Annabella Scarce, MD

## 2023-02-08 NOTE — Progress Notes (Addendum)
 Patient c/o new wound and mild pain at site with scant bleeding.  Noted new skin tear on posterior right elbow, approximately 3cm x 2cm x 0.1cm with loss of epidermis only.  Unattached edges with small area of epibole.  Documented in new LDA with image; assistance by Lauraine Companion, RN.  Applied silicone dressing after cleaning with soap and water  only.

## 2023-02-08 NOTE — Progress Notes (Signed)
 PHARMACY - ANTICOAGULATION CONSULT NOTE  Pharmacy Consult for heparin  Indication: DVT  No Known Allergies  Patient Measurements: Height: 5' 7 (170.2 cm) Weight: 95.8 kg (211 lb 3.2 oz) IBW/kg (Calculated) : 61.6 Heparin  Dosing Weight: 85kg  Vital Signs: Temp: 98.6 F (37 C) (02/04 0437) Temp Source: Oral (02/04 0437) BP: 129/64 (02/04 0437) Pulse Rate: 69 (02/04 0437)  Labs: Recent Labs    02/05/23 0724 02/05/23 0925 02/06/23 0543 02/07/23 0405 02/08/23 0447  HGB 9.7*  --  9.2* 8.9* 9.1*  HCT 29.9*  --  28.0* 26.4* 26.4*  PLT 202  --  214 201 214  HEPARINUNFRC  --   --   --   --  0.44  CREATININE 2.88*  --  2.52* 2.23*  --   TROPONINIHS 226* 216*  --   --   --     Estimated Creatinine Clearance: 33.5 mL/min (A) (by C-G formula based on SCr of 2.23 mg/dL (H)).  Assessment: Pt admitted for CHF. Doppler positive for DVT.  She has been on SQ heparin .   -Hgb 8.9>9.1, plt wnl -Heparin  level returned at 0.44 on 1350 units/hr (therapeutic) -No issues per RN  Goal of Therapy:  Heparin  level 0.3-0.7 units/ml Monitor platelets by anticoagulation protocol: Yes   Plan:  Continue heparin  infusion at 1350 units/hr Check 8 hr confirmatory heparin  level Daily heparin  level and CBC F/u with oral AC  Lynwood Poplar, PharmD. Clinical Pharmacist 02/08/2023 5:58 AM

## 2023-02-08 NOTE — Progress Notes (Signed)
 BMP showing low calcium  6.4.  Unable to calculate with albumin  for corrected calcium .  However patient had hypocalcemia yesterday and corrected calcium  level was 7.5 patient received 2 g of IV calcium  gluconate. - Ordering 2 g of IV calcium  gluconate.   Cheryl Gunnarson, MD [facility] 02/08/2023, 6:15 AM

## 2023-02-08 NOTE — Progress Notes (Signed)
 PHARMACY - ANTICOAGULATION CONSULT NOTE  Pharmacy Consult for heparin  Indication: DVT  No Known Allergies  Patient Measurements: Height: 5' 7 (170.2 cm) Weight: 95.8 kg (211 lb 3.2 oz) IBW/kg (Calculated) : 61.6 Heparin  Dosing Weight: 85kg  Vital Signs: Temp: 97.4 F (36.3 C) (02/04 1159) Temp Source: Oral (02/04 1159) BP: 112/54 (02/04 1159) Pulse Rate: 66 (02/04 1159)  Labs: Recent Labs    02/06/23 0543 02/07/23 0405 02/08/23 0447 02/08/23 1240  HGB 9.2* 8.9* 9.1*  --   HCT 28.0* 26.4* 26.4*  --   PLT 214 201 214  --   HEPARINUNFRC  --   --  0.44 0.43  CREATININE 2.52* 2.23* 2.26*  --     Estimated Creatinine Clearance: 33 mL/min (A) (by C-G formula based on SCr of 2.26 mg/dL (H)).  Assessment: 58 yo female admitted for CHF and doppler positive for DVT.  Pharmacy dosing heparin   -heparin  level at goal (0.43) on 1350 units/hr -CBC stable  Goal of Therapy:  Heparin  level 0.3-0.7 units/ml Monitor platelets by anticoagulation protocol: Yes   Plan:  Continue heparin  infusion at 1350 units/hr Daily heparin  level and CBC F/u with oral AC  Prentice Poisson, PharmD Clinical Pharmacist **Pharmacist phone directory can now be found on [email or web address] ([email or web address]).  Listed under [facility].

## 2023-02-08 NOTE — Progress Notes (Signed)
 [facility]                                                                              [name], is a 58 y.o. female, DOB - 09-15-1965, FMW:[id number] Admit date - 02/04/2023    Outpatient Primary MD for the patient is System, Provider Not In  LOS - 3  days  Chief Complaint  Patient presents with   Leg Swelling       Brief summary   Patient is a 58 year old female with ITP, diabetes mellitus type 2, CKD stage IIIa, chronic HFpEF, hypertension, CVA, ICH, PVD, right BKA, neurogenic bladder/chronic Foley presented to ED with bilateral lower extremity edema.  Patient reported 1 week of progressively worsening lower extremity edema up to her thighs.  Reported that it was getting difficult for her to get up from the wheelchair as her legs felt tight.  She had been taking Lasix  40 mg twice daily at home but not having much urine output.  Denied any shortness of breath or cough.  She also had a brief episode of left-sided chest pain.  In ED, hemoglobin 9.8, sodium 131, K5.5, bicarb 14, glucose 100, creatinine 2.7, calcium  6.4, mag 2.4, BNP 1180 Chest x-ray showed cardiomegaly with central congestion, pleural effusion. Received IV Lasix  40 mg and IV calcium  gluconate 1 g.   Troponin 226 -  216  Assessment & Plan    Principal Problem:   Acute on chronic heart failure with preserved ejection fraction (HFpEF) (HCC) Elevated troponin likely due to demand ischemia - Echo 06/2020: EF 60 to 65%, mild MVR.  -presented with significant bilateral lower extremity edema up to her thighs.  BNP 1180. Troponin 226 -  216. Cr 2.7, ?cardiorenal syndrome with anasarca -2D echo shows EF of 55 to 60%, no regional WMA, right ventricular pressure overload, G1 DD, low normal right ventricular systolic function, severely elevated pulmonary artery systolic pressure. -Continue Lasix  40 mg IV twice daily -Creatinine 2.26, renal insufficiency limits aggressive diuresis.  Active Problems:    AKI on CKD stage IIIa, metabolic acidosis, hyperkalemia  - possibly cardiorenal in the setting of acutely decompensated CHF.  - Cr 1.2 on labs done over 2 years ago, couldn't find recent labs in care everywhere. -Continue sod bicarb 650mg  TID.  -Creatinine 2.26, discussed with nephrology, appreciate recommendations  Hypocalcemia, prolonged QTc --Refractory to replacements -Nephrology consulted. -Severe vitamin D  deficiency 6.7, started on vitamin D  50,000 units weekly - PTH pending -Started on oral calcium  replacement, calcitriol   Acute RLE DVT -Elevated D-dimer.  Lower extremity Doppler positive for age-indeterminate DVT involving the right popliteal vein, no DVT in the LLE -Renal insufficiency precludes CT with contrast to rule out PE however given decreased right ventricular systolic function, severely elevated pulmonary artery systolic pressure, likely has underlying PE as well -Started on IV heparin  drip  Diarrhea -Improving, C. difficile negative, GI pathogen panel negative. -CT abdomen pelvis showed anasarca with diffuse mesenteric edema, otherwise no colitis or acute abdominal pathology -Placed on IV Rocephin , Flagyl , Imodium  prn     Mild hypervolemic hyponatremia Continue to monitor Na, stable  Hyperkalemia -Resolved  Essential HTN (hypertension) -Continue  IV Lasix , Imdur , metoprolol     Diabetes mellitus (HCC), diet controlled, -Hemoglobin A1c 5.4 -Continue follow-up   Chronic normocytic anemia -Baseline hemoglobin 9-10 -H&H stable  Obesity class II Estimated body mass index is 33.08 kg/m as calculated from the following:   Height as of this encounter: 5' 7 (1.702 m).   Weight as of this encounter: 95.8 kg.  Code Status: Full code DVT Prophylaxis:     Level of Care: Level of care: Telemetry Cardiac Family Communication: Updated patient Disposition Plan:      Remains inpatient appropriate:      Procedures:    Consultants:    Cardiology  Antimicrobials:   Anti-infectives (From admission, onward)    Start     Dose/Rate Route Frequency Ordered Stop   02/07/23 0800  cefTRIAXone  (ROCEPHIN ) 2 g in sodium chloride  0.9 % 100 mL IVPB        2 g 200 mL/hr over 30 Minutes Intravenous Every 24 hours 02/06/23 0713     02/06/23 0800  metroNIDAZOLE  (FLAGYL ) IVPB 500 mg        500 mg 100 mL/hr over 60 Minutes Intravenous Every 12 hours 02/06/23 0704     02/06/23 0715  cefTRIAXone  (ROCEPHIN ) 2 g in sodium chloride  0.9 % 100 mL IVPB        2 g 200 mL/hr over 30 Minutes Intravenous  Once 02/06/23 0704 02/06/23 1037          Medications  calcitRIOL   0.25 mcg Oral Daily   calcium  carbonate  1,000 mg of elemental calcium  Oral TID WC   furosemide   40 mg Intravenous BID   gabapentin   400 mg Oral BID   Gerhardt's butt cream   Topical TID   influenza vac split trivalent PF  0.5 mL Intramuscular Tomorrow-1000   isosorbide  mononitrate  45 mg Oral Daily   metoprolol  succinate  50 mg Oral Daily   sodium bicarbonate   650 mg Oral TID      Subjective:   [name] was seen and examined today.  States diarrhea now improving, Flexi-Seal off.  No nausea vomiting, abdominal pain.  Persistent lower extremity edema.  Improving. Objective:   Vitals:   02/08/23 0437 02/08/23 0747 02/08/23 1159 02/08/23 1400  BP: 129/64 116/60 (!) 112/54 (!) 108/59  Pulse: 69 68 66 66  Resp: 18 18 19 19   Temp: 98.6 F (37 C) 98.4 F (36.9 C) (!) 97.4 F (36.3 C) 98.4 F (36.9 C)  TempSrc: Oral Oral Oral Oral  SpO2: 96% 94% 92% 99%  Weight: 95.8 kg     Height:        Intake/Output Summary (Last 24 hours) at 02/08/2023 1513 Last data filed at 02/08/2023 1400 Gross per 24 hour  Intake 472.12 ml  Output 650 ml  Net -177.88 ml      Wt Readings from Last 3 Encounters:  02/08/23 95.8 kg  10/22/20 103 kg  10/02/20 107 kg   Physical Exam General: Alert and oriented x 3, NAD Cardiovascular: S1 S2 clear, RRR.  Respiratory:  CTAB, no wheezing Gastrointestinal: Soft, nontender, nondistended, NBS Ext: right BKA, LE edema bilaterally Psych: Normal affect        Data Reviewed:  I have personally reviewed following labs    CBC Lab Results  Component Value Date   WBC 8.2 02/08/2023   RBC 2.92 (L) 02/08/2023   HGB 9.1 (L) 02/08/2023   HCT 26.4 (L) 02/08/2023   MCV 90.4 02/08/2023   MCH 31.2 02/08/2023  PLT 214 02/08/2023   MCHC 34.5 02/08/2023   RDW 16.9 (H) 02/08/2023   LYMPHSABS 1.3 11/17/2020   MONOABS 0.8 11/17/2020   EOSABS 0.1 11/17/2020   BASOSABS 0.1 11/17/2020     Last metabolic panel Lab Results  Component Value Date   NA 132 (L) 02/08/2023   K 3.9 02/08/2023   CL 101 02/08/2023   CO2 16 (L) 02/08/2023   BUN 59 (H) 02/08/2023   CREATININE 2.26 (H) 02/08/2023   GLUCOSE 126 (H) 02/08/2023   GFRNONAA 25 (L) 02/08/2023   GFRAA 59 (L) 09/19/2019   CALCIUM  6.4 (LL) 02/08/2023   PHOS 2.9 10/01/2020   PROT 6.7 02/07/2023   ALBUMIN  2.7 (L) 02/08/2023   LABGLOB 4.0 05/16/2019   AGRATIO 1.1 (L) 05/16/2019   BILITOT 0.7 02/07/2023   ALKPHOS 169 (H) 02/07/2023   AST 25 02/07/2023   ALT 15 02/07/2023   ANIONGAP 15 02/08/2023    CBG (last 3)  No results for input(s): GLUCAP in the last 72 hours.    Coagulation Profile: No results for input(s): INR, PROTIME in the last 168 hours.   Radiology Studies: I have personally reviewed the imaging studies  VAS US  LOWER EXTREMITY VENOUS (DVT) Result Date: 02/08/2023  Lower Venous DVT Study Patient Name:  [name]  Date of Exam:   02/07/2023 Medical Rec #: [id number]          Accession #:    [id number] Date of Birth: 06-13-1965          Patient Gender: F Patient Age:   76 years Exam Location:  [facility] Procedure:      VAS US  LOWER EXTREMITY VENOUS (DVT) Referring Phys: TIFFANY [place] --------------------------------------------------------------------------------  Indications: Edema.  Limitations: Body habitus, poor  ultrasound/tissue interface and RLE BKA. Comparison Study: Previous exam on 04/11/2019 was negative for DVT Performing Technologist: Ezzie Potters RVT, RDMS  Examination Guidelines: A complete evaluation includes B-mode imaging, spectral Doppler, color Doppler, and power Doppler as needed of all accessible portions of each vessel. Bilateral testing is considered an integral part of a complete examination. Limited examinations for reoccurring indications may be performed as noted. The reflux portion of the exam is performed with the patient in reverse Trendelenburg.  +---------+---------------+---------+-----------+----------+-------------------+ RIGHT    CompressibilityPhasicitySpontaneityPropertiesThrombus Aging      +---------+---------------+---------+-----------+----------+-------------------+ CFV      Full           No       Yes        pulsatile                     +---------+---------------+---------+-----------+----------+-------------------+ SFJ      Full                                                             +---------+---------------+---------+-----------+----------+-------------------+ FV Prox  Full           No                  pulsatile                     +---------+---------------+---------+-----------+----------+-------------------+ FV Mid   Full           No  pulsatile                     +---------+---------------+---------+-----------+----------+-------------------+ FV Distal               No       Yes        pulsatile Not well visualized +---------+---------------+---------+-----------+----------+-------------------+ PFV      Full                                                             +---------+---------------+---------+-----------+----------+-------------------+ POP      Partial        Yes      Yes        pulsatile Age Indeterminate   +---------+---------------+---------+-----------+----------+-------------------+ PTV                                                    bka                 +---------+---------------+---------+-----------+----------+-------------------+ PERO                                                  bka                 +---------+---------------+---------+-----------+----------+-------------------+   +---------+---------------+---------+-----------+----------+-------------------+ LEFT     CompressibilityPhasicitySpontaneityPropertiesThrombus Aging      +---------+---------------+---------+-----------+----------+-------------------+ CFV      Full           No       Yes        pulsatile                     +---------+---------------+---------+-----------+----------+-------------------+ SFJ      Full                                                             +---------+---------------+---------+-----------+----------+-------------------+ FV Prox  Full           No       Yes        pulsatile                     +---------+---------------+---------+-----------+----------+-------------------+ FV Mid   Full           No       Yes        pulsatile                     +---------+---------------+---------+-----------+----------+-------------------+ FV DistalFull           No       Yes        pulsatile                     +---------+---------------+---------+-----------+----------+-------------------+ PFV      Full                                                             +---------+---------------+---------+-----------+----------+-------------------+  POP      Full           No       Yes        pulsatile                     +---------+---------------+---------+-----------+----------+-------------------+ PTV      Full                                         Not well visualized +---------+---------------+---------+-----------+----------+-------------------+ PERO     Full                                         Not well visualized  +---------+---------------+---------+-----------+----------+-------------------+     Summary: BILATERAL: -No evidence of popliteal cyst, bilaterally. -Subcutaneous edema, bilaterally RIGHT: - Findings consistent with age indeterminate deep vein thrombosis involving the right popliteal vein. Ultrasound characteristics of enlarged lymph nodes are noted in the groin.  LEFT: - There is no evidence of deep vein thrombosis in the lower extremity. - Ultrasound characteristics of enlarged lymph nodes noted in the groin.  *See table(s) above for measurements and observations. Electronically signed by Lonni Gaskins MD on 02/08/2023 at 11:46:06 AM.    Final        Nydia Distance M.D. [facility] 02/08/2023, 3:13 PM  Available via Epic secure chat 7am-7pm After 7 pm, please refer to night coverage provider listed on amion.

## 2023-02-08 NOTE — Progress Notes (Signed)
 Physical Therapy Treatment Patient Details Name: [name] MRN: [id number] DOB: Aug 23, 1965 Today's Date: 02/08/2023   History of Present Illness Pt is a 58 y.o. female who presented 02/05/23 with bil lower extremity edema. Admitted with acute on chronic diastolic heart failure. PMH: ITP, DM2, CKD stage IIIa, chronic HFpEF, HTN, CVA, ICH, PVD, right BKA, neurogenic bladder/chronic Foley    PT Comments  Progressing towards functional goals. Still requires up to mod assist for Squat pivot transfers and stand pivot transfer with RW. Very anxious in standing, fatigues quickly. Requires multimodal cues to sequence. May be able to complete these tasks at a min assist level with prosthesis but states she cannot donne at this time due to issues with her RLE, and does not have in the hospital with her. I think [facility] would be able to progress her to a mod I level quickly but does not appear to be a candidate. Would consider SNF as an alternative to regain functional strength since she will be alone during the day and needs to be able to care for herself, including simple transfers.   If plan is discharge home, recommend the following: A little help with walking and/or transfers;A little help with bathing/dressing/bathroom;Assistance with cooking/housework;Direct supervision/assist for financial management;Direct supervision/assist for medications management;Assist for transportation;Help with stairs or ramp for entrance;Supervision due to cognitive status   Can travel by private vehicle     No  Equipment Recommendations  None recommended by PT    Recommendations for Other Services       Precautions / Restrictions Precautions Precautions: Fall Precaution Comments: R BKA (prosthesis not present); Restrictions Weight Bearing Restrictions Per Provider Order: No     Mobility  Bed Mobility Overal bed mobility: Needs Assistance Bed Mobility: Rolling, Sidelying to Sit Rolling: Contact guard  assist Sidelying to sit: Contact guard assist       General bed mobility comments: CGA for safety, effortful to rise but no physical assist needed.    Transfers Overall transfer level: Needs assistance Equipment used: None, Rolling walker (2 wheels) Transfers: Bed to chair/wheelchair/BSC, Sit to/from Stand Sit to Stand: Mod assist Stand pivot transfers: Min assist   Squat pivot transfers: Mod assist     General transfer comment: Performed squat pivot transfers towards left and right side. Has difficulty sequencing. needs frequent cues for hand placement and technique. Able to lift buttocks when facilitated. Educated on importance to lift buttocks to reduce shear forces on pressure wound. Mod assist for boost to stand and stabilize with RW for support. Tolerated standing approx 2 min at EOB and progress with pivot on LLE with min assist for balance, cues for technique. Very anxious in standing, fatigues quickly.    Ambulation/Gait               General Gait Details: unable at baseline   Stairs             Wheelchair Mobility     Tilt Bed    Modified Rankin (Stroke Patients Only)       Balance Overall balance assessment: Needs assistance Sitting-balance support: No upper extremity supported, Feet supported Sitting balance-Leahy Scale: Fair     Standing balance support: Bilateral upper extremity supported Standing balance-Leahy Scale: Poor Standing balance comment: Reliant on UE support                            Cognition Arousal: Alert Behavior During Therapy: WFL for tasks assessed/performed, Anxious  Overall Cognitive Status: Impaired/Different from baseline Area of Impairment: Following commands, Problem solving, Memory, Safety/judgement                     Memory: Decreased short-term memory Following Commands: Follows one step commands inconsistently Safety/Judgement: Decreased awareness of deficits   Problem Solving: Slow  processing, Difficulty sequencing, Requires verbal cues, Requires tactile cues          Exercises      General Comments        Pertinent Vitals/Pain Pain Assessment Pain Assessment: No/denies pain    Home Living                          Prior Function            PT Goals (current goals can now be found in the care plan section) Acute Rehab PT Goals Patient Stated Goal: to get stronger PT Goal Formulation: With patient Time For Goal Achievement: 02/20/23 Potential to Achieve Goals: Good Progress towards PT goals: Progressing toward goals    Frequency    Min 1X/week      PT Plan      Co-evaluation              AM-PAC PT 6 Clicks Mobility   Outcome Measure  Help needed turning from your back to your side while in a flat bed without using bedrails?: A Little Help needed moving from lying on your back to sitting on the side of a flat bed without using bedrails?: A Little Help needed moving to and from a bed to a chair (including a wheelchair)?: A Lot Help needed standing up from a chair using your arms (e.g., wheelchair or bedside chair)?: A Lot Help needed to walk in hospital room?: Total Help needed climbing 3-5 steps with a railing? : Total 6 Click Score: 12    End of Session Equipment Utilized During Treatment: Gait belt Activity Tolerance: Patient tolerated treatment well Patient left: in chair;with call bell/phone within reach;with chair alarm set Nurse Communication: Mobility status;Need for lift equipment (Try stedy) PT Visit Diagnosis: Unsteadiness on feet (R26.81);Muscle weakness (generalized) (M62.81);Difficulty in walking, not elsewhere classified (R26.2)     Time: 8673-8643 PT Time Calculation (min) (ACUTE ONLY): 30 min  Charges:    $Therapeutic Activity: 23-37 mins PT General Charges $$ ACUTE PT VISIT: 1 Visit                     Leontine Roads, PT, DPT [facility]  Rehabilitation Services Physical  Therapist Office: [phone] Website: [email or web address]    [name] 02/08/2023, 3:08 PM

## 2023-02-08 NOTE — Plan of Care (Signed)
  Problem: Education: Goal: Knowledge of General Education information will improve Description: Including pain rating scale, medication(s)/side effects and non-pharmacologic comfort measures Outcome: Progressing   Problem: Health Behavior/Discharge Planning: Goal: Ability to manage health-related needs will improve Outcome: Progressing   Problem: Clinical Measurements: Goal: Ability to maintain clinical measurements within normal limits will improve Outcome: Progressing Goal: Will remain free from infection Outcome: Progressing Goal: Diagnostic test results will improve Outcome: Progressing Goal: Respiratory complications will improve Outcome: Progressing Goal: Cardiovascular complication will be avoided Outcome: Progressing   Problem: Activity: Goal: Risk for activity intolerance will decrease Outcome: Progressing   Problem: Nutrition: Goal: Adequate nutrition will be maintained Outcome: Progressing   Problem: Coping: Goal: Level of anxiety will decrease Outcome: Progressing   Problem: Elimination: Goal: Will not experience complications related to bowel motility Outcome: Progressing Goal: Will not experience complications related to urinary retention Outcome: Progressing   Problem: Pain Managment: Goal: General experience of comfort will improve and/or be controlled Outcome: Progressing   Problem: Safety: Goal: Ability to remain free from injury will improve Outcome: Progressing   Problem: Skin Integrity: Goal: Risk for impaired skin integrity will decrease Outcome: Progressing   Problem: Education: Goal: Ability to demonstrate management of disease process will improve Outcome: Progressing Goal: Ability to verbalize understanding of medication therapies will improve Outcome: Progressing Goal: Individualized Educational Video(s) Outcome: Progressing   Problem: Activity: Goal: Capacity to carry out activities will improve Outcome: Progressing    Problem: Cardiac: Goal: Ability to achieve and maintain adequate cardiopulmonary perfusion will improve Outcome: Progressing   Problem: Consults Goal: Venous Thromboembolism Patient Education Description: See Patient Education Module for education specifics. Outcome: Progressing Goal: Diagnosis - Venous Thromboembolism (VTE) Description: Choose a selection Outcome: Progressing Note: DVT (Deep Vein Thrombosis) Goal: Pharmacy Consult for anticoagulation Outcome: Progressing Goal: Skin Care Protocol Initiated - if Braden Score 18 or less Description: If consults are not indicated, leave blank or document N/A Outcome: Progressing Goal: Nutrition Consult-if indicated Outcome: Progressing Goal: Diabetes Guidelines if Diabetic/Glucose > 140 Description: If diabetic or lab glucose is > 140 mg/dl - Initiate Diabetes/Hyperglycemia Guidelines & Document Interventions  Outcome: Progressing   Problem: Phase I Progression Outcomes Goal: Pain controlled with appropriate interventions Outcome: Progressing Goal: Dyspnea controlled at rest (PE) Outcome: Progressing Goal: Tolerating diet Outcome: Progressing Goal: Initial discharge plan identified Outcome: Progressing Goal: Voiding-avoid urinary catheter unless indicated Outcome: Progressing Goal: Hemodynamically stable Outcome: Progressing Goal: Other Phase I Outcomes/Goals Outcome: Progressing

## 2023-02-08 NOTE — Consult Note (Signed)
 Reason for Consult: Acute kidney injury on chronic kidney disease stage IIIb, hypocalcemia Referring Physician: Nydia Distance MD [facility])  HPI:  58 year old woman with past medical history significant for hypertension, dyslipidemia, type 2 diabetes mellitus complicated by neuropathy, peripheral vascular disease status post right BKA, HFpEF, history of idiopathic thrombocytopenic purpura, neurogenic bladder with chronic indwelling Foley catheter and suspected chronic kidney disease stage III at baseline (last labs available from 2022 show creatinine around 1.2-1.4).  She last saw nephrology in 2017 and has since been lost to follow-up (even after efforts were made in 2022 to schedule hospital follow-up).  She was admitted to the hospital 3 days ago with progressively worsening bilateral lower extremity edema that impaired her mobility in spite of taking furosemide  40 mg twice a day.  Of note, she informs me that she was taking furosemide  as needed and had not taken it in several weeks before restarting it about 1 to 2 weeks prior to admission.  She was admitted with acute exacerbation of chronic HFpEF and started on diuresis that has improved her volume status and has been accompanied by downtrending renal function.  She has had persistent hypocalcemia with corresponding EKG changes.  She denies any preceding nausea, vomiting or diarrhea and does not currently have any chest pain or shortness of breath.  She denies any hematuria, dysuria or urgency.  She was incidentally found to have a left leg DVT on Dopplers yesterday.  Past Medical History:  Diagnosis Date   Acute exacerbation of CHF (congestive heart failure) (HCC) 04/11/2019   Anemia    Arthritis    spine, hands (11/25/2015)   Back pain    all my back; probably 3 times/week (11/25/2015)   Chronic indwelling Foley catheter 03/04/2017   Diabetic foot ulcer associated with type 2 diabetes mellitus (HCC)    Hypertension    Intracerebral  hemorrhage (HCC) As a teenager    States she had burst blood vessel as teenager, now with resultant minor visual field and hearing deficits   Neuropathy    Stroke [facility]) ~ 1982   my feeling on my RLE, right lower eye vision,  & hearing out of my right ear not the same since (11/25/2015)   Type II diabetes mellitus (HCC)     Past Surgical History:  Procedure Laterality Date   AMPUTATION  11/24/2010   Procedure: AMPUTATION FOOT;  Surgeon: Norleen Armor, MD;  Location: [facility] OR;  Service: Orthopedics;  Laterality: Right;  Right  FOOT TRANS METATARSAL AMPUTATION   AMPUTATION  07/02/2011   Procedure: AMPUTATION BELOW KNEE;  Surgeon: Norleen Armor, MD;  Location: [facility] OR;  Service: Orthopedics;  Laterality: Right;   APPLICATION OF WOUND VAC  07/02/2011   Procedure: APPLICATION OF WOUND VAC;  Surgeon: Norleen Armor, MD;  Location: [facility] OR;  Service: Orthopedics;  Laterality: Right;   BRAIN SURGERY  1980's   Previous brain surgery in [place], [place]   CESAREAN SECTION  2004; 2006   COLONOSCOPY N/A 09/17/2015   Procedure: COLONOSCOPY;  Surgeon: Jerrell Sol, MD;  Location: [facility] ENDOSCOPY;  Service: Endoscopy;  Laterality: N/A;   ESOPHAGOGASTRODUODENOSCOPY N/A 09/17/2015   Procedure: ESOPHAGOGASTRODUODENOSCOPY (EGD);  Surgeon: Jerrell Sol, MD;  Location: [facility] ENDOSCOPY;  Service: Endoscopy;  Laterality: N/A;   I & D EXTREMITY  11/24/2010   Procedure: IRRIGATION AND DEBRIDEMENT EXTREMITY;  Surgeon: Norleen Armor, MD;  Location: [facility] OR;  Service: Orthopedics;  Laterality: Left;  Debriedment of left plantar ulcer and trimming of toenails   I & D EXTREMITY  07/02/2011   Procedure: IRRIGATION AND DEBRIDEMENT EXTREMITY;  Surgeon: Norleen Armor, MD;  Location: [facility] OR;  Service: Orthopedics;  Laterality: Left;  I & D of Left Foot   I & D EXTREMITY  07/06/2011   Procedure: IRRIGATION AND DEBRIDEMENT EXTREMITY;  Surgeon: Norleen Armor, MD;  Location: [facility] OR;  Service: Orthopedics;  Laterality: Right;  IRRIGATION/DEBRIDEMENT RIGHT  BELOW KNEE AMPUTATION   IR THORACENTESIS ASP PLEURAL SPACE W/IMG GUIDE  09/22/2020   LAPAROSCOPIC SPLENECTOMY N/A 09/18/2020   Procedure: HAND ASSISTED LAPAROSCOPIC SPLENECTOMY;  Surgeon: Aron Shoulders, MD;  Location: [facility] OR;  Service: General;  Laterality: N/A;  120 MINUTES   TUBAL LIGATION  2006    Family History  Problem Relation Age of Onset   Diabetes Mother    Hypertension Mother    Hyperlipidemia Mother    Diabetes Father    Cancer Father        prostate   Hyperlipidemia Father    Hypertension Father    Diabetes Brother    Peripheral Artery Disease Brother        has had toes amputated   Diabetes Son     Social History:  reports that she has never smoked. She has never used smokeless tobacco. She reports current alcohol  use of about 2.0 standard drinks of alcohol  per week. She reports that she does not use drugs.  Allergies: No Known Allergies  Medications: I have reviewed the patient's current medications. Scheduled:  furosemide   40 mg Intravenous BID   gabapentin   400 mg Oral BID   Gerhardt's butt cream   Topical TID   influenza vac split trivalent PF  0.5 mL Intramuscular Tomorrow-1000   isosorbide  mononitrate  45 mg Oral Daily   metoprolol  succinate  50 mg Oral Daily   sodium bicarbonate   650 mg Oral TID   Continuous:  cefTRIAXone  (ROCEPHIN )  IV Stopped (02/07/23 1036)   heparin  1,350 Units/hr (02/07/23 1829)   metronidazole  500 mg (02/08/23 1124)       Latest Ref Rng & Units 02/08/2023    4:47 AM 02/07/2023    4:05 AM 02/06/2023    5:43 AM  BMP  Glucose 70 - 99 mg/dL 873  867  899   BUN 6 - 20 mg/dL 59  56  55   Creatinine 0.44 - 1.00 mg/dL 7.73  7.76  7.47   Sodium 135 - 145 mmol/L 132  132  133   Potassium 3.5 - 5.1 mmol/L 3.9  4.1  4.2   Chloride 98 - 111 mmol/L 101  107  106   CO2 22 - 32 mmol/L 16  16  13    Calcium  8.9 - 10.3 mg/dL 6.4  6.2  6.5       Latest Ref Rng & Units 02/08/2023    4:47 AM 02/07/2023    4:05 AM 02/06/2023    5:43 AM  CBC  WBC  4.0 - 10.5 K/uL 8.2  7.3  8.1   Hemoglobin 12.0 - 15.0 g/dL 9.1  8.9  9.2   Hematocrit 36.0 - 46.0 % 26.4  26.4  28.0   Platelets 150 - 400 K/uL 214  201  214      VAS US  LOWER EXTREMITY VENOUS (DVT) Result Date: 02/08/2023  Lower Venous DVT Study Patient Name:  ARYAM ZHAN  Date of Exam:   02/07/2023 Medical Rec #: [id number]          Accession #:    [id number] Date of Birth: December 18, 1965  Patient Gender: F Patient Age:   17 years Exam Location:  [facility] Procedure:      VAS US  LOWER EXTREMITY VENOUS (DVT) Referring Phys: TIFFANY Heyworth --------------------------------------------------------------------------------  Indications: Edema.  Limitations: Body habitus, poor ultrasound/tissue interface and RLE BKA. Comparison Study: Previous exam on 04/11/2019 was negative for DVT Performing Technologist: Ezzie Potters RVT, RDMS  Examination Guidelines: A complete evaluation includes B-mode imaging, spectral Doppler, color Doppler, and power Doppler as needed of all accessible portions of each vessel. Bilateral testing is considered an integral part of a complete examination. Limited examinations for reoccurring indications may be performed as noted. The reflux portion of the exam is performed with the patient in reverse Trendelenburg.  +---------+---------------+---------+-----------+----------+-------------------+ RIGHT    CompressibilityPhasicitySpontaneityPropertiesThrombus Aging      +---------+---------------+---------+-----------+----------+-------------------+ CFV      Full           No       Yes        pulsatile                     +---------+---------------+---------+-----------+----------+-------------------+ SFJ      Full                                                             +---------+---------------+---------+-----------+----------+-------------------+ FV Prox  Full           No                  pulsatile                      +---------+---------------+---------+-----------+----------+-------------------+ FV Mid   Full           No                  pulsatile                     +---------+---------------+---------+-----------+----------+-------------------+ FV Distal               No       Yes        pulsatile Not well visualized +---------+---------------+---------+-----------+----------+-------------------+ PFV      Full                                                             +---------+---------------+---------+-----------+----------+-------------------+ POP      Partial        Yes      Yes        pulsatile Age Indeterminate   +---------+---------------+---------+-----------+----------+-------------------+ PTV                                                   bka                 +---------+---------------+---------+-----------+----------+-------------------+ PERO  bka                 +---------+---------------+---------+-----------+----------+-------------------+   +---------+---------------+---------+-----------+----------+-------------------+ LEFT     CompressibilityPhasicitySpontaneityPropertiesThrombus Aging      +---------+---------------+---------+-----------+----------+-------------------+ CFV      Full           No       Yes        pulsatile                     +---------+---------------+---------+-----------+----------+-------------------+ SFJ      Full                                                             +---------+---------------+---------+-----------+----------+-------------------+ FV Prox  Full           No       Yes        pulsatile                     +---------+---------------+---------+-----------+----------+-------------------+ FV Mid   Full           No       Yes        pulsatile                     +---------+---------------+---------+-----------+----------+-------------------+ FV  DistalFull           No       Yes        pulsatile                     +---------+---------------+---------+-----------+----------+-------------------+ PFV      Full                                                             +---------+---------------+---------+-----------+----------+-------------------+ POP      Full           No       Yes        pulsatile                     +---------+---------------+---------+-----------+----------+-------------------+ PTV      Full                                         Not well visualized +---------+---------------+---------+-----------+----------+-------------------+ PERO     Full                                         Not well visualized +---------+---------------+---------+-----------+----------+-------------------+     Summary: BILATERAL: -No evidence of popliteal cyst, bilaterally. -Subcutaneous edema, bilaterally RIGHT: - Findings consistent with age indeterminate deep vein thrombosis involving the right popliteal vein. Ultrasound characteristics of enlarged lymph nodes are noted in the groin.  LEFT: - There is no evidence of deep vein thrombosis in the lower extremity. - Ultrasound characteristics of enlarged lymph nodes noted in the groin.  *  See table(s) above for measurements and observations. Electronically signed by Lonni Gaskins MD on 02/08/2023 at 11:46:06 AM.    Final     Review of Systems Blood pressure 116/60, pulse 68, temperature 98.4 F (36.9 C), temperature source Oral, resp. rate 18, height 5' 7 (1.702 m), weight 95.8 kg, SpO2 94%. Physical Exam Vitals reviewed.  Constitutional:      Appearance: Normal appearance. She is obese. She is not ill-appearing or toxic-appearing.     Comments: Appears fatigued  HENT:     Head: Normocephalic and atraumatic.     Right Ear: External ear normal.     Left Ear: External ear normal.     Nose: Nose normal.     Mouth/Throat:     Mouth: Mucous membranes are dry.      Pharynx: Oropharynx is clear.  Eyes:     Extraocular Movements: Extraocular movements intact.     Conjunctiva/sclera: Conjunctivae normal.  Cardiovascular:     Rate and Rhythm: Normal rate and regular rhythm.     Pulses: Normal pulses.     Heart sounds: Normal heart sounds.  Pulmonary:     Effort: Pulmonary effort is normal.     Breath sounds: Normal breath sounds. No wheezing.  Abdominal:     General: Bowel sounds are normal.     Palpations: Abdomen is soft.     Tenderness: There is no abdominal tenderness.     Comments: Obese  Musculoskeletal:     Cervical back: Normal range of motion and neck supple.     Left lower leg: Edema present.     Comments: Right BKA 1+ stump edema, left leg 2+ edema extending to lower thigh  Skin:    General: Skin is warm and dry.     Findings: No lesion.  Neurological:     Mental Status: She is alert and oriented to person, place, and time. Mental status is at baseline.     Assessment/Plan: 1.  Acute kidney injury on chronic kidney disease stage III: This appears to be most likely cardiorenal in nature in the setting of CHF exacerbation/renal vascular congestion.  In all likelihood, she has had progression of her chronic kidney disease at baseline and will probably establish a baseline.  Renal function is improving slowly with ongoing diuretics and I agree with reassessment of intravascular volume status as planned by cardiology to help guide additional diuretic therapy.  Will obtain a urinalysis to verify that there is no indication of additional etiologies such as GN.  Earlier done renal ultrasound was negative for obstruction. Avoid nephrotoxic medications including NSAIDs and iodinated intravenous contrast exposure unless the latter is absolutely indicated.  Preferred narcotic agents for pain control are hydromorphone , fentanyl , and methadone. Morphine  should not be used. Avoid Baclofen and avoid oral sodium phosphate  and magnesium  citrate based  laxatives / bowel preps. Continue strict Input and Output monitoring. Will monitor the patient closely with you and intervene or adjust therapy as indicated by changes in clinical status/labs. 2.  Hypocalcemia: Refractory to earlier efforts at medical management.  Will check PTH and 25-hydroxy vitamin D  level and start the patient on scheduled calcitriol  along with oral calcium . 3.  Acute exacerbation of congestive heart failure: Ongoing diuresis with plans noted to recheck BNP/interstitial volume with Reds vest to guide additional diuresis.  Of note, she does have a right leg DVT that may predispose that extremity to venous hypertension and chronic lymphedema. 4.  Hyponatremia: Appears most consistent with inappropriate ADH state in  the setting of CHF exacerbation.  Monitor with diuresis and limit free water  intake.   Gordy MARLA Blanch 02/08/2023, 11:58 AM

## 2023-02-09 ENCOUNTER — Inpatient Hospital Stay (HOSPITAL_COMMUNITY): Payer: Medicare Other

## 2023-02-09 DIAGNOSIS — N179 Acute kidney failure, unspecified: Secondary | ICD-10-CM | POA: Diagnosis not present

## 2023-02-09 DIAGNOSIS — I5033 Acute on chronic diastolic (congestive) heart failure: Secondary | ICD-10-CM | POA: Diagnosis not present

## 2023-02-09 DIAGNOSIS — I5043 Acute on chronic combined systolic (congestive) and diastolic (congestive) heart failure: Secondary | ICD-10-CM | POA: Diagnosis not present

## 2023-02-09 LAB — CBC
HCT: 25.9 % — ABNORMAL LOW (ref 36.0–46.0)
Hemoglobin: 8.9 g/dL — ABNORMAL LOW (ref 12.0–15.0)
MCH: 30.9 pg (ref 26.0–34.0)
MCHC: 34.4 g/dL (ref 30.0–36.0)
MCV: 89.9 fL (ref 80.0–100.0)
Platelets: 208 10*3/uL (ref 150–400)
RBC: 2.88 MIL/uL — ABNORMAL LOW (ref 3.87–5.11)
RDW: 16.8 % — ABNORMAL HIGH (ref 11.5–15.5)
WBC: 7.7 10*3/uL (ref 4.0–10.5)
nRBC: 2.8 % — ABNORMAL HIGH (ref 0.0–0.2)

## 2023-02-09 LAB — BASIC METABOLIC PANEL
Anion gap: 14 (ref 5–15)
BUN: 63 mg/dL — ABNORMAL HIGH (ref 6–20)
CO2: 15 mmol/L — ABNORMAL LOW (ref 22–32)
Calcium: 6.6 mg/dL — ABNORMAL LOW (ref 8.9–10.3)
Chloride: 99 mmol/L (ref 98–111)
Creatinine, Ser: 2.21 mg/dL — ABNORMAL HIGH (ref 0.44–1.00)
GFR, Estimated: 26 mL/min — ABNORMAL LOW (ref >60)
Glucose, Bld: 98 mg/dL (ref 70–99)
Potassium: 4 mmol/L (ref 3.5–5.1)
Sodium: 128 mmol/L — ABNORMAL LOW (ref 135–145)

## 2023-02-09 LAB — MAGNESIUM: Magnesium: 1.9 mg/dL (ref 1.7–2.4)

## 2023-02-09 LAB — PARATHYROID HORMONE, INTACT (NO CA): PTH: 373 pg/mL — ABNORMAL HIGH (ref 15–65)

## 2023-02-09 LAB — HEPARIN LEVEL (UNFRACTIONATED)
Heparin Unfractionated: 0.19 [IU]/mL — ABNORMAL LOW (ref 0.30–0.70)
Heparin Unfractionated: 0.13 [IU]/mL — ABNORMAL LOW (ref 0.30–0.70)

## 2023-02-09 MED ORDER — FUROSEMIDE 10 MG/ML IJ SOLN
80.0000 mg | Freq: Two times a day (BID) | INTRAMUSCULAR | Status: DC
  Administered 2023-02-09 – 2023-02-10 (×2): 80 mg via INTRAVENOUS
  Filled 2023-02-09 (×2): qty 8

## 2023-02-09 MED ORDER — TECHNETIUM TO 99M ALBUMIN AGGREGATED
4.4000 | Freq: Once | INTRAVENOUS | Status: AC | PRN
  Administered 2023-02-09: 4.4 via INTRAVENOUS

## 2023-02-09 NOTE — Plan of Care (Signed)
  Problem: Education: Goal: Knowledge of General Education information will improve Description: Including pain rating scale, medication(s)/side effects and non-pharmacologic comfort measures 02/09/2023 1440 by Lynnette Cena CROME, RN Outcome: Progressing 02/09/2023 1439 by Lynnette Cena CROME, RN Outcome: Progressing   Problem: Health Behavior/Discharge Planning: Goal: Ability to manage health-related needs will improve Outcome: Progressing

## 2023-02-09 NOTE — Progress Notes (Signed)
 Patient Name: [name] Date of Encounter: 02/09/2023 [facility] Cardiologist: Soyla DELENA Merck, MD   Interval Summary  .    Feeling well.  Notes persistent LE edema but improved.   Vital Signs .    Vitals:   02/08/23 1159 02/08/23 1400 02/08/23 1942 02/09/23 0353  BP: (!) 112/54 (!) 108/59 123/60 (!) 115/54  Pulse: 66 66 73 66  Resp: 19 19 16 18   Temp: (!) 97.4 F (36.3 C) 98.4 F (36.9 C) 98.1 F (36.7 C) 98.1 F (36.7 C)  TempSrc: Oral Oral Oral Oral  SpO2: 92% 99% (!) 85% 92%  Weight:    96.6 kg  Height:        Intake/Output Summary (Last 24 hours) at 02/09/2023 1016 Last data filed at 02/09/2023 0726 Gross per 24 hour  Intake 893.6 ml  Output 200 ml  Net 693.6 ml      02/09/2023    3:53 AM 02/08/2023    4:37 AM 02/07/2023    4:15 AM  Last 3 Weights  Weight (lbs) 212 lb 15.4 oz 211 lb 3.2 oz 213 lb  Weight (kg) 96.6 kg 95.8 kg 96.616 kg      Telemetry/ECG    Sinus rhythm - Personally Reviewed  Echo 02/05/23: 1. Left ventricular ejection fraction, by estimation, is 55 to 60%. Left  ventricular ejection fraction by 2D MOD biplane is 55.8 %. The left  ventricle has normal function. The left ventricle has no regional wall  motion abnormalities. Left ventricular  diastolic parameters are consistent with Grade I diastolic dysfunction  (impaired relaxation). There is the interventricular septum is flattened  in systole, consistent with right ventricular pressure overload.   2. Right ventricular systolic function is low normal. The right  ventricular size is moderately enlarged. There is severely elevated  pulmonary artery systolic pressure. The estimated right ventricular  systolic pressure is 64.6 mmHg.   3. Right atrial size was severely dilated.   4. The mitral valve is grossly normal. Trivial mitral valve  regurgitation.   5. The tricuspid valve is abnormal. Tricuspid valve regurgitation is  moderate to severe.   6. The aortic valve is  tricuspid. Aortic valve regurgitation is not  visualized.   7. The inferior vena cava is dilated in size with <50% respiratory  variability, suggesting right atrial pressure of 15 mmHg.   LE Venous Doppler 02/07/23: Summary:  BILATERAL:  -No evidence of popliteal cyst, bilaterally.   -Subcutaneous edema, bilaterally  RIGHT:   - Findings consistent with age indeterminate deep vein thrombosis  involving the right popliteal vein.  Ultrasound characteristics of enlarged lymph nodes are noted in the groin.    LEFT:   - There is no evidence of deep vein thrombosis in the lower extremity.  - Ultrasound characteristics of enlarged lymph nodes noted in the groin.   Physical Exam .    VS:  BP (!) 115/54 (BP Location: Left Arm)   Pulse 66   Temp 98.1 F (36.7 C) (Oral)   Resp 18   Ht 5' 7 (1.702 m)   Wt 96.6 kg   LMP  (LMP Unknown)   SpO2 92%   BMI 33.35 kg/m  , BMI Body mass index is 33.35 kg/m. GENERAL:  Well appearing HEENT: Pupils equal round and reactive, fundi not visualized, oral mucosa unremarkable NECK:  No jugular venous distention, waveform within normal limits, carotid upstroke brisk and symmetric, no bruits, no thyromegaly LUNGS:  Clear to auscultation bilaterally HEART:  RRR.  PMI not displaced or sustained,S1 and S2 within normal limits, no S3, no S4, no clicks, no rubs, no murmurs ABD:  Flat, positive bowel sounds normal in frequency in pitch, no bruits, no rebound, no guarding, no midline pulsatile mass, no hepatomegaly, no splenomegaly EXT: R BKA.  + dependent LE edema, no cyanosis no clubbing SKIN:  No rashes no nodules NEURO:  Cranial nerves II through XII grossly intact, motor grossly intact throughout PSYCH:  Cognitively intact, oriented to person place and time  Assessment & Plan .     [name] is a 81F with ITP s/p splenectomy, hypertension, HFpEF, CVA and CKD4 admitted with acute on chronic HFpEF and chest pain.  # Acute on chronic HFpEF:  # RV  failure:  # Pulmonary HTN:  She presented to the hospital with 4 to 6 weeks of lower extremity edema in the setting of running out of her Lasix .  She resumed it prior to admission but had poor urinary response.  BNP was 1181 on admission.  Chest x-ray consistent with vascular congestion.  Systolic function was preserved but RV function was low normal.   LE Doppler was positivie and she likely has a PE.  Now on anticoagulation.  She has R>L dependent edema.  Renal function seems to have hit a plateau and BUN is rising.  Reds vest was elevated to 41 and BNP is elevated.  Yet we are not making progress with diuresis.  I'm concerned that cor pulmonale and RV failure are hindering her progress.  We will get a V/Q scan to formally assess for PE.  Based on this, will likely need to get advanced HF involved and possibly inotropic support for the RV.  May need more aggressive treatment of her PE.  # Chest pain: # Elevated troponin: High sensitivity troponin 24 --> 26 --> 216.  Chest pain was somewhat atypical.  EKG is without acute ischemic changes.  Continue aspirin , Imdur , and metoprolol  for now.  Given her new finding of DVT/PE and atypical symptoms with preserved systolic function, no plan for inpatient ischemia evaluation at this time.   # Acute on chronic kidney disease: Creatinine on admission was 2.7.  Baseline is 1.3, though this was from many years ago.    For questions or updates, please contact [facility] Please consult [email or web address] for contact info under        Signed, Annabella Scarce, MD

## 2023-02-09 NOTE — Progress Notes (Signed)
 PHARMACY - ANTICOAGULATION CONSULT NOTE  Pharmacy Consult for heparin  Indication: DVT  Labs: Recent Labs    02/07/23 0405 02/08/23 0447 02/08/23 1240 02/09/23 0428  HGB 8.9* 9.1*  --  8.9*  HCT 26.4* 26.4*  --  25.9*  PLT 201 214  --  208  HEPARINUNFRC  --  0.44 0.43 0.19*  CREATININE 2.23* 2.26*  --  2.21*   Assessment: 57yo female subtherapeutic on heparin  after two levels at goal; no infusion issues or signs of bleeding per RN.  Goal of Therapy:  Heparin  level 0.3-0.7 units/ml   Plan:  Increase heparin  infusion by 2 units/kgABW/hr to 1500 units/hr. Check level in 8 hours.   Marvetta Dauphin, PharmD, BCPS 02/09/2023 6:56 AM

## 2023-02-09 NOTE — Plan of Care (Signed)
   Problem: Education: Goal: Knowledge of General Education information will improve Description Including pain rating scale, medication(s)/side effects and non-pharmacologic comfort measures Outcome: Progressing

## 2023-02-09 NOTE — Progress Notes (Signed)
 PHARMACY - ANTICOAGULATION CONSULT NOTE  Pharmacy Consult for heparin  Indication: DVT  No Known Allergies  Patient Measurements: Height: 5' 7 (170.2 cm) Weight: 96.6 kg (212 lb 15.4 oz) IBW/kg (Calculated) : 61.6 Heparin  Dosing Weight: 85kg  Vital Signs: Temp: 97.9 F (36.6 C) (02/05 1224) Temp Source: Oral (02/05 1224) BP: 114/47 (02/05 1224) Pulse Rate: 69 (02/05 1224)  Labs: Recent Labs    02/07/23 0405 02/07/23 0405 02/08/23 0447 02/08/23 1240 02/09/23 0428 02/09/23 1611  HGB 8.9*  --  9.1*  --  8.9*  --   HCT 26.4*  --  26.4*  --  25.9*  --   PLT 201  --  214  --  208  --   HEPARINUNFRC  --    < > 0.44 0.43 0.19* 0.13*  CREATININE 2.23*  --  2.26*  --  2.21*  --    < > = values in this interval not displayed.    Estimated Creatinine Clearance: 33.9 mL/min (A) (by C-G formula based on SCr of 2.21 mg/dL (H)).  Assessment: 58 yo female admitted for CHF and doppler positive for DVT.  Pharmacy dosing heparin . VQ scan pending to r/o PE.  -heparin  level remains subtherapeutic (0.13) despite rate increase to 1500 units/hr -per RN, no pauses or interruptions to the infusion and no bleeding  Goal of Therapy:  Heparin  level 0.3-0.7 units/ml Monitor platelets by anticoagulation protocol: Yes   Plan:  Increase heparin  infusion to 1700 units/hr Recheck heparin  level in 8hr Daily heparin  level and CBC F/u with oral AC  Rocky Slade, PharmD, BCPS Clinical Pharmacist **Pharmacist phone directory can now be found on [email or web address] ([email or web address]).  Listed under [facility].

## 2023-02-09 NOTE — Progress Notes (Signed)
 [facility]                                                                              Dontavia Wolf, is a 58 y.o. female, DOB - Apr 09, 1965, FMW:[id number] Admit date - 02/04/2023    Outpatient Primary MD for the patient is [name], Provider Not In  LOS - 4  days  Chief Complaint  Patient presents with   Leg Swelling       Brief summary   Patient is a 58 year old female with ITP, diabetes mellitus type 2, CKD stage IIIa, chronic HFpEF, hypertension, CVA, ICH, PVD, right BKA, neurogenic bladder/chronic Foley presented to ED with bilateral lower extremity edema.  Patient reported 1 week of progressively worsening lower extremity edema up to her thighs.  Reported that it was getting difficult for her to get up from the wheelchair as her legs felt tight.  She had been taking Lasix  40 mg twice daily at home but not having much urine output.  Denied any shortness of breath or cough.  She also had a brief episode of left-sided chest pain.  In ED, hemoglobin 9.8, sodium 131, K5.5, bicarb 14, glucose 100, creatinine 2.7, calcium  6.4, mag 2.4, BNP 1180 Chest x-ray showed cardiomegaly with central congestion, pleural effusion. Received IV Lasix  40 mg and IV calcium  gluconate 1 g.   Troponin 226 -  216  Assessment & Plan    Principal Problem:   Acute on chronic heart failure with preserved ejection fraction (HFpEF) (HCC) Elevated troponin likely due to demand ischemia - Echo 06/2020: EF 60 to 65%, mild MVR.  -presented with significant bilateral lower extremity edema up to her thighs.  BNP 1180. Troponin 226 -  216. Cr 2.7, ?cardiorenal syndrome with anasarca -2D echo shows EF of 55 to 60%, no regional WMA, right ventricular pressure overload, G1 DD, low normal right ventricular systolic function, severely elevated pulmonary artery systolic pressure. -Cardiology, nephrology following, increased IV Lasix  to 80 mg twice daily -Lower extremity edema improving  Active  Problems:   AKI on CKD stage IIIa, metabolic acidosis, hyperkalemia  - possibly cardiorenal in the setting of acutely decompensated CHF.  - Cr 1.2 on labs done over 2 years ago, couldn't find recent labs in care everywhere. -Continue sod bicarb 650mg  TID.  -Creatinine 2.26, discussed with nephrology, appreciate recommendations  Hypocalcemia, prolonged QTc --Refractory to replacements -Nephrology consulted. -Severe vitamin D  deficiency 6.7, started on vitamin D  50,000 units weekly - PTH pending -Started on oral calcium  replacement, calcitriol  -Improving  Acute RLE DVT Severe pulmonary hypertension -Elevated D-dimer.  Lower extremity Doppler positive for age-indeterminate DVT involving the right popliteal vein, no DVT in the LLE -Renal insufficiency precludes CT with contrast to rule out PE however given decreased right ventricular systolic function, severely elevated pulmonary artery systolic pressure, likely has underlying PE as well.   -VQ scan today -Continue IV heparin  drip  Diarrhea -Improving, C. difficile negative, GI pathogen panel negative. -CT abdomen pelvis showed anasarca with diffuse mesenteric edema, otherwise no colitis or acute abdominal pathology -Placed on IV Rocephin , Flagyl , Imodium  prn     Hypervolemic hyponatremia -BNP 1321, sodium trending down  to 128 today -Nephrology following, increased IV Lasix   Hyperkalemia -Resolved  Essential HTN (hypertension) -BP stable.   -Continue IV Lasix , Imdur , metoprolol     Diabetes mellitus (HCC), diet controlled, -Hemoglobin A1c 5.4 -Continue follow-up   Chronic normocytic anemia -Baseline hemoglobin 9-10 -H&H stable  Obesity class II Estimated body mass index is 33.35 kg/m as calculated from the following:   Height as of this encounter: 5' 7 (1.702 m).   Weight as of this encounter: 96.6 kg.  Code Status: Full code DVT Prophylaxis:     Level of Care: Level of care: Telemetry Cardiac Family  Communication: Updated patient Disposition Plan:      Remains inpatient appropriate:      Procedures:    Consultants:   Cardiology  Antimicrobials:   Anti-infectives (From admission, onward)    Start     Dose/Rate Route Frequency Ordered Stop   02/07/23 0800  cefTRIAXone  (ROCEPHIN ) 2 g in sodium chloride  0.9 % 100 mL IVPB        2 g 200 mL/hr over 30 Minutes Intravenous Every 24 hours 02/06/23 0713     02/06/23 0800  metroNIDAZOLE  (FLAGYL ) IVPB 500 mg        500 mg 100 mL/hr over 60 Minutes Intravenous Every 12 hours 02/06/23 0704     02/06/23 0715  cefTRIAXone  (ROCEPHIN ) 2 g in sodium chloride  0.9 % 100 mL IVPB        2 g 200 mL/hr over 30 Minutes Intravenous  Once 02/06/23 0704 02/06/23 1037          Medications  calcitRIOL   0.25 mcg Oral Daily   calcium  carbonate  1,000 mg of elemental calcium  Oral TID WC   furosemide   80 mg Intravenous BID   gabapentin   400 mg Oral BID   Gerhardt's butt cream   Topical TID   influenza vac split trivalent PF  0.5 mL Intramuscular Tomorrow-1000   isosorbide  mononitrate  45 mg Oral Daily   metoprolol  succinate  50 mg Oral Daily   sodium bicarbonate   650 mg Oral TID   Vitamin D  (Ergocalciferol )  50,000 Units Oral Q7 days      Subjective:   [name] was seen and examined today.  Diarrhea resolved, Flexi-Seal off.  Lower extremity swelling improving, still has dependent edema.  No acute chest pain or shortness of breath.  Objective:   Vitals:   02/08/23 1400 02/08/23 1942 02/09/23 0353 02/09/23 1224  BP: (!) 108/59 123/60 (!) 115/54 (!) 114/47  Pulse: 66 73 66 69  Resp: 19 16 18 18   Temp: 98.4 F (36.9 C) 98.1 F (36.7 C) 98.1 F (36.7 C) 97.9 F (36.6 C)  TempSrc: Oral Oral Oral Oral  SpO2: 99% (!) 85% 92%   Weight:   96.6 kg   Height:        Intake/Output Summary (Last 24 hours) at 02/09/2023 1334 Last data filed at 02/09/2023 1228 Gross per 24 hour  Intake 861.48 ml  Output 200 ml  Net 661.48 ml       Wt Readings from Last 3 Encounters:  02/09/23 96.6 kg  10/22/20 103 kg  10/02/20 107 kg    Physical Exam General: Alert and oriented x 3, NAD Cardiovascular: S1 S2 clear, RRR.  Respiratory: CTAB, no wheezing, rales or rhonchi Gastrointestinal: Soft, nontender, nondistended, NBS, anasarca Ext: right BKA, with dependent LE edema Neuro: no new deficits Psych: Normal affect          Data Reviewed:  I have personally  reviewed following labs    CBC Lab Results  Component Value Date   WBC 7.7 02/09/2023   RBC 2.88 (L) 02/09/2023   HGB 8.9 (L) 02/09/2023   HCT 25.9 (L) 02/09/2023   MCV 89.9 02/09/2023   MCH 30.9 02/09/2023   PLT 208 02/09/2023   MCHC 34.4 02/09/2023   RDW 16.8 (H) 02/09/2023   LYMPHSABS 1.3 11/17/2020   MONOABS 0.8 11/17/2020   EOSABS 0.1 11/17/2020   BASOSABS 0.1 11/17/2020     Last metabolic panel Lab Results  Component Value Date   NA 128 (L) 02/09/2023   K 4.0 02/09/2023   CL 99 02/09/2023   CO2 15 (L) 02/09/2023   BUN 63 (H) 02/09/2023   CREATININE 2.21 (H) 02/09/2023   GLUCOSE 98 02/09/2023   GFRNONAA 26 (L) 02/09/2023   GFRAA 59 (L) 09/19/2019   CALCIUM  6.6 (L) 02/09/2023   PHOS 2.9 10/01/2020   PROT 6.7 02/07/2023   ALBUMIN  2.7 (L) 02/08/2023   LABGLOB 4.0 05/16/2019   AGRATIO 1.1 (L) 05/16/2019   BILITOT 0.7 02/07/2023   ALKPHOS 169 (H) 02/07/2023   AST 25 02/07/2023   ALT 15 02/07/2023   ANIONGAP 14 02/09/2023    CBG (last 3)  No results for input(s): GLUCAP in the last 72 hours.    Coagulation Profile: No results for input(s): INR, PROTIME in the last 168 hours.   Radiology Studies: I have personally reviewed the imaging studies  VAS US  LOWER EXTREMITY VENOUS (DVT) Result Date: 02/08/2023  Lower Venous DVT Study Patient Name:  TAEJA DEBELLIS  Date of Exam:   02/07/2023 Medical Rec #: [id number]          Accession #:    [id number] Date of Birth: 1965/03/07          Patient Gender: F Patient Age:   29 years  Exam Location:  [facility] Procedure:      VAS US  LOWER EXTREMITY VENOUS (DVT) Referring Phys: TIFFANY Refugio --------------------------------------------------------------------------------  Indications: Edema.  Limitations: Body habitus, poor ultrasound/tissue interface and RLE BKA. Comparison Study: Previous exam on 04/11/2019 was negative for DVT Performing Technologist: Ezzie Potters RVT, RDMS  Examination Guidelines: A complete evaluation includes B-mode imaging, spectral Doppler, color Doppler, and power Doppler as needed of all accessible portions of each vessel. Bilateral testing is considered an integral part of a complete examination. Limited examinations for reoccurring indications may be performed as noted. The reflux portion of the exam is performed with the patient in reverse Trendelenburg.  +---------+---------------+---------+-----------+----------+-------------------+ RIGHT    CompressibilityPhasicitySpontaneityPropertiesThrombus Aging      +---------+---------------+---------+-----------+----------+-------------------+ CFV      Full           No       Yes        pulsatile                     +---------+---------------+---------+-----------+----------+-------------------+ SFJ      Full                                                             +---------+---------------+---------+-----------+----------+-------------------+ FV Prox  Full           No                  pulsatile                     +---------+---------------+---------+-----------+----------+-------------------+  FV Mid   Full           No                  pulsatile                     +---------+---------------+---------+-----------+----------+-------------------+ FV Distal               No       Yes        pulsatile Not well visualized +---------+---------------+---------+-----------+----------+-------------------+ PFV      Full                                                              +---------+---------------+---------+-----------+----------+-------------------+ POP      Partial        Yes      Yes        pulsatile Age Indeterminate   +---------+---------------+---------+-----------+----------+-------------------+ PTV                                                   bka                 +---------+---------------+---------+-----------+----------+-------------------+ PERO                                                  bka                 +---------+---------------+---------+-----------+----------+-------------------+   +---------+---------------+---------+-----------+----------+-------------------+ LEFT     CompressibilityPhasicitySpontaneityPropertiesThrombus Aging      +---------+---------------+---------+-----------+----------+-------------------+ CFV      Full           No       Yes        pulsatile                     +---------+---------------+---------+-----------+----------+-------------------+ SFJ      Full                                                             +---------+---------------+---------+-----------+----------+-------------------+ FV Prox  Full           No       Yes        pulsatile                     +---------+---------------+---------+-----------+----------+-------------------+ FV Mid   Full           No       Yes        pulsatile                     +---------+---------------+---------+-----------+----------+-------------------+ FV DistalFull           No       Yes        pulsatile                     +---------+---------------+---------+-----------+----------+-------------------+  PFV      Full                                                             +---------+---------------+---------+-----------+----------+-------------------+ POP      Full           No       Yes        pulsatile                      +---------+---------------+---------+-----------+----------+-------------------+ PTV      Full                                         Not well visualized +---------+---------------+---------+-----------+----------+-------------------+ PERO     Full                                         Not well visualized +---------+---------------+---------+-----------+----------+-------------------+     Summary: BILATERAL: -No evidence of popliteal cyst, bilaterally. -Subcutaneous edema, bilaterally RIGHT: - Findings consistent with age indeterminate deep vein thrombosis involving the right popliteal vein. Ultrasound characteristics of enlarged lymph nodes are noted in the groin.  LEFT: - There is no evidence of deep vein thrombosis in the lower extremity. - Ultrasound characteristics of enlarged lymph nodes noted in the groin.  *See table(s) above for measurements and observations. Electronically signed by Lonni Gaskins MD on 02/08/2023 at 11:46:06 AM.    Final        Nydia Distance M.D. [facility] 02/09/2023, 1:34 PM  Available via Epic secure chat 7am-7pm After 7 pm, please refer to night coverage provider listed on amion.

## 2023-02-09 NOTE — Progress Notes (Signed)
 Patient ID: [name], female   DOB: 10-05-1965, 58 y.o.   MRN: [id number] [facility] Progress Note   Assessment/ Plan:   1.  Acute kidney injury on chronic kidney disease stage III: This appears to be most likely cardiorenal in nature in the setting of CHF exacerbation/renal vascular congestion with high likelihood of progression of her underlying chronic kidney disease over the past 2 years.  Renal function essentially unchanged overnight with slight worsening of BUN and unimpressive (possibly inaccurately charted) urine output overnight.  Given hemodynamic measurements, will increase furosemide  to 80 mg IV twice daily.  Plans noted for advanced heart failure service involvement. 2.  Hypocalcemia: Refractory to earlier efforts at medical management.  PTH level pending and started on calcitriol  empirically along with ergocalciferol  for severe vitamin D  deficiency. 3.  Acute exacerbation of congestive heart failure: Will cautiously increase furosemide  to 80 mg IV twice daily starting this evening and monitor response.  Await additional input from advanced heart failure service 4.  Hyponatremia: Appears most consistent with inappropriate ADH state in the setting of CHF exacerbation.  Monitor with diuresis and limit free water  intake.  Subjective:   Denies any acute events overnight, notes some slight improvement of pedal edema without chest pain or shortness of breath.   Objective:   BP (!) 115/54 (BP Location: Left Arm)   Pulse 66   Temp 98.1 F (36.7 C) (Oral)   Resp 18   Ht 5' 7 (1.702 m)   Wt 96.6 kg   LMP  (LMP Unknown)   SpO2 92%   BMI 33.35 kg/m   Intake/Output Summary (Last 24 hours) at 02/09/2023 1222 Last data filed at 02/09/2023 9273 Gross per 24 hour  Intake 661.48 ml  Output 200 ml  Net 461.48 ml   Weight change: 0.8 kg  Physical Exam: Gen: Comfortably resting in bed, hygiene measures just finished CVS: Pulse regular rhythm, normal rate, S1 and S2  normal Resp: Clear to auscultation bilaterally without rales/rhonchi Abd: Soft, obese, nontender Ext: 1+ right BKA stump edema with 2+ left leg edema extending to just above the knee  Imaging: VAS US  LOWER EXTREMITY VENOUS (DVT) Result Date: 02/08/2023  Lower Venous DVT Study Patient Name:  [name]  Date of Exam:   02/07/2023 Medical Rec #: [id number]          Accession #:    [id number] Date of Birth: [birth year]          Patient Gender: F Patient Age:   58 years Exam Location:  [facility] Procedure:      VAS US  LOWER EXTREMITY VENOUS (DVT) Referring Phys: TIFFANY Lowgap --------------------------------------------------------------------------------  Indications: Edema.  Limitations: Body habitus, poor ultrasound/tissue interface and RLE BKA. Comparison Study: Previous exam on 04/11/2019 was negative for DVT Performing Technologist: Ezzie Potters RVT, RDMS  Examination Guidelines: A complete evaluation includes B-mode imaging, spectral Doppler, color Doppler, and power Doppler as needed of all accessible portions of each vessel. Bilateral testing is considered an integral part of a complete examination. Limited examinations for reoccurring indications may be performed as noted. The reflux portion of the exam is performed with the patient in reverse Trendelenburg.  +---------+---------------+---------+-----------+----------+-------------------+ RIGHT    CompressibilityPhasicitySpontaneityPropertiesThrombus Aging      +---------+---------------+---------+-----------+----------+-------------------+ CFV      Full           No       Yes        pulsatile                     +---------+---------------+---------+-----------+----------+-------------------+  SFJ      Full                                                             +---------+---------------+---------+-----------+----------+-------------------+ FV Prox  Full           No                  pulsatile                      +---------+---------------+---------+-----------+----------+-------------------+ FV Mid   Full           No                  pulsatile                     +---------+---------------+---------+-----------+----------+-------------------+ FV Distal               No       Yes        pulsatile Not well visualized +---------+---------------+---------+-----------+----------+-------------------+ PFV      Full                                                             +---------+---------------+---------+-----------+----------+-------------------+ POP      Partial        Yes      Yes        pulsatile Age Indeterminate   +---------+---------------+---------+-----------+----------+-------------------+ PTV                                                   bka                 +---------+---------------+---------+-----------+----------+-------------------+ PERO                                                  bka                 +---------+---------------+---------+-----------+----------+-------------------+   +---------+---------------+---------+-----------+----------+-------------------+ LEFT     CompressibilityPhasicitySpontaneityPropertiesThrombus Aging      +---------+---------------+---------+-----------+----------+-------------------+ CFV      Full           No       Yes        pulsatile                     +---------+---------------+---------+-----------+----------+-------------------+ SFJ      Full                                                             +---------+---------------+---------+-----------+----------+-------------------+ FV Prox  Full  No       Yes        pulsatile                     +---------+---------------+---------+-----------+----------+-------------------+ FV Mid   Full           No       Yes        pulsatile                     +---------+---------------+---------+-----------+----------+-------------------+ FV  DistalFull           No       Yes        pulsatile                     +---------+---------------+---------+-----------+----------+-------------------+ PFV      Full                                                             +---------+---------------+---------+-----------+----------+-------------------+ POP      Full           No       Yes        pulsatile                     +---------+---------------+---------+-----------+----------+-------------------+ PTV      Full                                         Not well visualized +---------+---------------+---------+-----------+----------+-------------------+ PERO     Full                                         Not well visualized +---------+---------------+---------+-----------+----------+-------------------+     Summary: BILATERAL: -No evidence of popliteal cyst, bilaterally. -Subcutaneous edema, bilaterally RIGHT: - Findings consistent with age indeterminate deep vein thrombosis involving the right popliteal vein. Ultrasound characteristics of enlarged lymph nodes are noted in the groin.  LEFT: - There is no evidence of deep vein thrombosis in the lower extremity. - Ultrasound characteristics of enlarged lymph nodes noted in the groin.  *See table(s) above for measurements and observations. Electronically signed by Lonni Gaskins MD on 02/08/2023 at 11:46:06 AM.    Final     Labs: BMET Recent Labs  Lab 02/04/23 2000 02/05/23 9275 02/06/23 0543 02/07/23 0405 02/08/23 0447 02/09/23 0428  NA 131* 132* 133* 132* 132* 128*  K 5.5* 5.4* 4.2 4.1 3.9 4.0  CL 106 107 106 107 101 99  CO2 14* 12* 13* 16* 16* 15*  GLUCOSE 100* 90 100* 132* 126* 98  BUN 50* 52* 55* 56* 59* 63*  CREATININE 2.77* 2.88* 2.52* 2.23* 2.26* 2.21*  CALCIUM  6.4* 6.7* 6.5* 6.2* 6.4* 6.6*   CBC Recent Labs  Lab 02/06/23 0543 02/07/23 0405 02/08/23 0447 02/09/23 0428  WBC 8.1 7.3 8.2 7.7  HGB 9.2* 8.9* 9.1* 8.9*  HCT 28.0* 26.4* 26.4*  25.9*  MCV 93.6 90.4 90.4 89.9  PLT 214 201 214 208    Medications:     calcitRIOL   0.25 mcg Oral Daily  calcium  carbonate  1,000 mg of elemental calcium  Oral TID WC   furosemide   80 mg Intravenous BID   gabapentin   400 mg Oral BID   Gerhardt's butt cream   Topical TID   influenza vac split trivalent PF  0.5 mL Intramuscular Tomorrow-1000   isosorbide  mononitrate  45 mg Oral Daily   metoprolol  succinate  50 mg Oral Daily   sodium bicarbonate   650 mg Oral TID   Vitamin D  (Ergocalciferol )  50,000 Units Oral Q7 days    Gordy Blanch, MD 02/09/2023, 12:22 PM

## 2023-02-09 NOTE — Plan of Care (Signed)
  Problem: Education: Goal: Knowledge of General Education information will improve Description: Including pain rating scale, medication(s)/side effects and non-pharmacologic comfort measures Outcome: Progressing   Problem: Health Behavior/Discharge Planning: Goal: Ability to manage health-related needs will improve Outcome: Progressing   Problem: Clinical Measurements: Goal: Will remain free from infection Outcome: Progressing Goal: Diagnostic test results will improve Outcome: Progressing Goal: Respiratory complications will improve Outcome: Progressing   Problem: Activity: Goal: Risk for activity intolerance will decrease Outcome: Progressing   Problem: Pain Managment: Goal: General experience of comfort will improve and/or be controlled Outcome: Progressing   Problem: Activity: Goal: Capacity to carry out activities will improve Outcome: Progressing   Problem: Skin Integrity: Goal: Risk for impaired skin integrity will decrease Outcome: Progressing

## 2023-02-10 ENCOUNTER — Other Ambulatory Visit: Payer: Self-pay

## 2023-02-10 DIAGNOSIS — N179 Acute kidney failure, unspecified: Secondary | ICD-10-CM | POA: Diagnosis not present

## 2023-02-10 DIAGNOSIS — I272 Pulmonary hypertension, unspecified: Secondary | ICD-10-CM

## 2023-02-10 DIAGNOSIS — I2699 Other pulmonary embolism without acute cor pulmonale: Secondary | ICD-10-CM

## 2023-02-10 DIAGNOSIS — I5043 Acute on chronic combined systolic (congestive) and diastolic (congestive) heart failure: Secondary | ICD-10-CM | POA: Diagnosis not present

## 2023-02-10 DIAGNOSIS — I82431 Acute embolism and thrombosis of right popliteal vein: Secondary | ICD-10-CM | POA: Diagnosis not present

## 2023-02-10 DIAGNOSIS — I5033 Acute on chronic diastolic (congestive) heart failure: Secondary | ICD-10-CM | POA: Diagnosis not present

## 2023-02-10 DIAGNOSIS — I10 Essential (primary) hypertension: Secondary | ICD-10-CM

## 2023-02-10 DIAGNOSIS — I50810 Right heart failure, unspecified: Secondary | ICD-10-CM

## 2023-02-10 DIAGNOSIS — L8990 Pressure ulcer of unspecified site, unspecified stage: Secondary | ICD-10-CM | POA: Insufficient documentation

## 2023-02-10 LAB — BASIC METABOLIC PANEL
Anion gap: 13 (ref 5–15)
BUN: 68 mg/dL — ABNORMAL HIGH (ref 6–20)
CO2: 15 mmol/L — ABNORMAL LOW (ref 22–32)
Calcium: 6.5 mg/dL — ABNORMAL LOW (ref 8.9–10.3)
Chloride: 101 mmol/L (ref 98–111)
Creatinine, Ser: 2.37 mg/dL — ABNORMAL HIGH (ref 0.44–1.00)
GFR, Estimated: 23 mL/min — ABNORMAL LOW (ref >60)
Glucose, Bld: 113 mg/dL — ABNORMAL HIGH (ref 70–99)
Potassium: 4 mmol/L (ref 3.5–5.1)
Sodium: 129 mmol/L — ABNORMAL LOW (ref 135–145)

## 2023-02-10 LAB — CBC
HCT: 26.1 % — ABNORMAL LOW (ref 36.0–46.0)
Hemoglobin: 8.9 g/dL — ABNORMAL LOW (ref 12.0–15.0)
MCH: 30.7 pg (ref 26.0–34.0)
MCHC: 34.1 g/dL (ref 30.0–36.0)
MCV: 90 fL (ref 80.0–100.0)
Platelets: 207 10*3/uL (ref 150–400)
RBC: 2.9 MIL/uL — ABNORMAL LOW (ref 3.87–5.11)
RDW: 16.4 % — ABNORMAL HIGH (ref 11.5–15.5)
WBC: 8.2 10*3/uL (ref 4.0–10.5)
nRBC: 1.5 % — ABNORMAL HIGH (ref 0.0–0.2)

## 2023-02-10 LAB — COOXEMETRY PANEL
Carboxyhemoglobin: 1.5 % (ref 0.5–1.5)
Methemoglobin: 1 % (ref 0.0–1.5)
O2 Saturation: 77.5 %
Total hemoglobin: 9.6 g/dL — ABNORMAL LOW (ref 12.0–16.0)

## 2023-02-10 LAB — HEPARIN LEVEL (UNFRACTIONATED)
Heparin Unfractionated: 0.24 [IU]/mL — ABNORMAL LOW (ref 0.30–0.70)
Heparin Unfractionated: 0.7 [IU]/mL (ref 0.30–0.70)

## 2023-02-10 LAB — MAGNESIUM: Magnesium: 1.9 mg/dL (ref 1.7–2.4)

## 2023-02-10 LAB — LACTIC ACID, PLASMA: Lactic Acid, Venous: 1.4 mmol/L (ref 0.5–1.9)

## 2023-02-10 MED ORDER — SODIUM CHLORIDE 0.9% FLUSH
10.0000 mL | Freq: Two times a day (BID) | INTRAVENOUS | Status: DC
  Administered 2023-02-10: 10 mL
  Administered 2023-02-11 – 2023-02-12 (×2): 30 mL
  Administered 2023-02-13: 10 mL
  Administered 2023-02-13: 30 mL
  Administered 2023-02-14 – 2023-02-16 (×4): 10 mL
  Administered 2023-02-16: 30 mL
  Administered 2023-02-17: 10 mL
  Administered 2023-02-17: 30 mL
  Administered 2023-02-18: 10 mL

## 2023-02-10 MED ORDER — CHLORHEXIDINE GLUCONATE CLOTH 2 % EX PADS
6.0000 | MEDICATED_PAD | Freq: Every day | CUTANEOUS | Status: DC
  Administered 2023-02-10 – 2023-02-18 (×9): 6 via TOPICAL

## 2023-02-10 MED ORDER — FUROSEMIDE 10 MG/ML IJ SOLN
120.0000 mg | Freq: Once | INTRAVENOUS | Status: AC
  Administered 2023-02-10: 120 mg via INTRAVENOUS
  Filled 2023-02-10: qty 10

## 2023-02-10 MED ORDER — SODIUM CHLORIDE 0.9% FLUSH: 10.0000 mL | INTRAVENOUS | Status: DC | PRN

## 2023-02-10 MED ORDER — HEPARIN BOLUS VIA INFUSION
2000.0000 [IU] | Freq: Once | INTRAVENOUS | Status: AC
  Administered 2023-02-10: 2000 [IU] via INTRAVENOUS
  Filled 2023-02-10: qty 2000

## 2023-02-10 MED ORDER — GUAIFENESIN-DM 100-10 MG/5ML PO SYRP
5.0000 mL | ORAL_SOLUTION | ORAL | Status: DC | PRN
  Administered 2023-02-11 – 2023-02-18 (×13): 5 mL via ORAL
  Filled 2023-02-10 (×13): qty 5

## 2023-02-10 MED ORDER — MILRINONE LACTATE IN DEXTROSE 20-5 MG/100ML-% IV SOLN
0.1250 ug/kg/min | INTRAVENOUS | Status: DC
  Administered 2023-02-10 – 2023-02-14 (×10): 0.25 ug/kg/min via INTRAVENOUS
  Filled 2023-02-10 (×9): qty 100

## 2023-02-10 NOTE — Progress Notes (Signed)
 Peripherally Inserted Central Catheter Placement  The IV Nurse has discussed with the patient and/or persons authorized to consent for the patient, the purpose of this procedure and the potential benefits and risks involved with this procedure.  The benefits include less needle sticks, lab draws from the catheter, and the patient may be discharged home with the catheter. Risks include, but not limited to, infection, bleeding, blood clot (thrombus formation), and puncture of an artery; nerve damage and irregular heartbeat and possibility to perform a PICC exchange if needed/ordered by physician.  Alternatives to this procedure were also discussed.  Bard Power PICC patient education guide, fact sheet on infection prevention and patient information card has been provided to patient /or left at bedside.    PICC Placement Documentation  PICC Triple Lumen 02/10/23 Right Brachial 40 cm 3 cm (Active)  Indication for Insertion or Continuance of Line Vasoactive infusions 02/10/23 1609  Exposed Catheter (cm) 3 cm 02/10/23 1609  Site Assessment Clean, Dry, Intact 02/10/23 1609  Lumen #1 Status Flushed;Blood return noted;Saline locked 02/10/23 1609  Lumen #2 Status Flushed;No blood return;Saline locked 02/10/23 1609  Lumen #3 Status Flushed;Blood return noted;Saline locked 02/10/23 1609  Dressing Type Transparent 02/10/23 1609  Dressing Status Antimicrobial disc/dressing in place 02/10/23 1609  Line Care Connections checked and tightened 02/10/23 1609  Line Adjustment (NICU/IV Team Only) No 02/10/23 1609  Dressing Intervention New dressing 02/10/23 1609  Dressing Change Due 02/17/23 02/10/23 1609       [name] 02/10/2023, 4:11 PM

## 2023-02-10 NOTE — Progress Notes (Signed)
 Patient Name: [name] Date of Encounter: 02/10/2023 [facility] Cardiologist: Cheryl DELENA Merck, MD   Interval Summary  .    Feeling well.  Notes persistent LE edema.  Vital Signs .    Vitals:   02/09/23 0353 02/09/23 1224 02/09/23 2100 02/10/23 0346  BP: (!) 115/54 (!) 114/47 99/73 (!) 118/49  Pulse: 66 69 70 70  Resp: 18 18 16 16   Temp: 98.1 F (36.7 C) 97.9 F (36.6 C) 98.2 F (36.8 C) 99.5 F (37.5 C)  TempSrc: Oral Oral Oral Oral  SpO2: 92%  99% 94%  Weight: 96.6 kg   105.7 kg  Height:        Intake/Output Summary (Last 24 hours) at 02/10/2023 1054 Last data filed at 02/10/2023 0725 Gross per 24 hour  Intake 571.63 ml  Output 200 ml  Net 371.63 ml      02/10/2023    3:46 AM 02/09/2023    3:53 AM 02/08/2023    4:37 AM  Last 3 Weights  Weight (lbs) 233 lb 1.6 oz 212 lb 15.4 oz 211 lb 3.2 oz  Weight (kg) 105.733 kg 96.6 kg 95.8 kg      Telemetry/ECG    Sinus rhythm - Personally Reviewed  Echo 02/05/23: 1. Left ventricular ejection fraction, by estimation, is 55 to 60%. Left  ventricular ejection fraction by 2D MOD biplane is 55.8 %. The left  ventricle has normal function. The left ventricle has no regional wall  motion abnormalities. Left ventricular  diastolic parameters are consistent with Grade I diastolic dysfunction  (impaired relaxation). There is the interventricular septum is flattened  in systole, consistent with right ventricular pressure overload.   2. Right ventricular systolic function is low normal. The right  ventricular size is moderately enlarged. There is severely elevated  pulmonary artery systolic pressure. The estimated right ventricular  systolic pressure is 64.6 mmHg.   3. Right atrial size was severely dilated.   4. The mitral valve is grossly normal. Trivial mitral valve  regurgitation.   5. The tricuspid valve is abnormal. Tricuspid valve regurgitation is  moderate to severe.   6. The aortic valve is tricuspid.  Aortic valve regurgitation is not  visualized.   7. The inferior vena cava is dilated in size with <50% respiratory  variability, suggesting right atrial pressure of 15 mmHg.   LE Venous Doppler 02/07/23: Summary:  BILATERAL:  -No evidence of popliteal cyst, bilaterally.   -Subcutaneous edema, bilaterally  RIGHT:   - Findings consistent with age indeterminate deep vein thrombosis  involving the right popliteal vein.  Ultrasound characteristics of enlarged lymph nodes are noted in the groin.    LEFT:   - There is no evidence of deep vein thrombosis in the lower extremity.  - Ultrasound characteristics of enlarged lymph nodes noted in the groin.   V/Q Scan 02/09/23: FINDINGS: Wedge-shaped perfusion defects are demonstrated in the superior and posterior basal segments of the right lower lung and posterior basal segment of the left lower lung.   IMPRESSION: Perfusion defects are demonstrated in both lower lungs consistent with presence of pulmonary embolus by PISAPED criteria.  Physical Exam .    VS:  BP (!) 118/49 (BP Location: Left Arm)   Pulse 70   Temp 99.5 F (37.5 C) (Oral)   Resp 16   Ht 5' 7 (1.702 m)   Wt 105.7 kg   LMP  (LMP Unknown)   SpO2 94%   BMI 36.51 kg/m  ,  BMI Body mass index is 36.51 kg/m. GENERAL:  Well appearing HEENT: Pupils equal round and reactive, fundi not visualized, oral mucosa unremarkable NECK:  No jugular venous distention, waveform within normal limits, carotid upstroke brisk and symmetric, no bruits, no thyromegaly LUNGS:  Clear to auscultation bilaterally HEART:  RRR.  PMI not displaced or sustained,S1 and S2 within normal limits, no S3, no S4, no clicks, no rubs, no murmurs ABD:  Flat, positive bowel sounds normal in frequency in pitch, no bruits, no rebound, no guarding, no midline pulsatile mass, no hepatomegaly, no splenomegaly EXT: R BKA.  + dependent LE edema, no cyanosis no clubbing SKIN:  No rashes no nodules NEURO:  Cranial  nerves II through XII grossly intact, motor grossly intact throughout PSYCH:  Cognitively intact, oriented to person place and time  Assessment & Plan .     [name] is a 42F with ITP s/p splenectomy, hypertension, HFpEF, CVA and CKD4 admitted with acute on chronic HFpEF and chest pain.  # Acute on chronic HFpEF:  # RV failure:  # Pulmonary HTN:  She presented to the hospital with 4 to 6 weeks of lower extremity edema in the setting of running out of her Lasix .  She resumed it prior to admission but had poor urinary response.  BNP was 1181 on admission.  Chest x-ray consistent with vascular congestion.  Systolic function was preserved but RV function was low normal.  Pulmonary pressure was severely elevated.  Given her renal dysfunction and abnormal CXR on admission, I elected for LE Doppler was positive.  This essentially confirmed PE as the cause of her RV dysfunction and elevated pulmonary pressure. She was started on anticoagulation.  Unfortunately diuresis slowed and renal function no longer improving.  Reds Vest 41 confirmed that she was still volume overloaded and the LE edema is not just due to venous insufficiency.  I think she needs inotropic support for the RV and more aggressive diuresis and treatment of her PE.  Avoiding RHC in the setting of active PE.   # Chest pain: # Elevated troponin: High sensitivity troponin 24 --> 26 --> 216.  Chest pain was somewhat atypical.  EKG is without acute ischemic changes.  Continue aspirin , Imdur , and metoprolol  for now.  Given her new finding of DVT/PE and atypical symptoms with preserved systolic function, no plan for inpatient ischemia evaluation at this time.   # Acute on chronic kidney disease: Creatinine on admission was 2.7.  Baseline is 1.3, though this was from many years ago.    Total critical care time: 50 minutes. Critical care time was exclusive of separately billable procedures and treating other patients. Critical care was necessary  to treat or prevent imminent or life-threatening deterioration. Critical care was time spent personally by me on the following activities: development of treatment plan with patient and/or surrogate as well as nursing, discussions with consultants, evaluation of patient's response to treatment, examination of patient, obtaining history from patient or surrogate, ordering and performing treatments and interventions, ordering and review of laboratory studies, ordering and review of radiographic studies, pulse oximetry and re-evaluation of patient's condition.   For questions or updates, please contact [facility] Please consult [email or web address] for contact info under        Signed, Annabella Scarce, MD

## 2023-02-10 NOTE — Consult Note (Signed)
 Advanced Heart Failure Team Consult Note   Primary Physician: System, Provider Not In Cardiologist:  Soyla DELENA Merck, MD  Reason for Consultation: Cor Pulmonale  HPI:    [name] is seen today for evaluation of cor pulmonale at the request of Dr. Raford.   [name] is a 58 y.o. female with a hx of ITP s/p splenectomy 2022, DM2, hypertension, anemia, BKA, HFpEF, CVA, ICH, PVD, CKD 4, neurogenic bladder, thyroid  nodules, poor mobility, and poor compliance.   Admitted in 09/2020 with volume overload and did not follow up in clinic. Closely re-admitted again in 2022 for ITP s/p splenectomy and volume overload after not following with cancer center. This admission also complicated by sepsis r/t PNA and L pleural effusion s/p thoracentesis.  Has not been seen by PCP since 2023 until last month at a home visit. Does not have a cardiologist.  Was most recently seen by Primary Care NP for home visit on 01/29/23 d/t being home and wheelchair bound. At that time, she had overall been doing well with no concerns and reported good quality of life with family support. Noted to have BLE edema and was out of her medications including Lasix , all of her medications were refilled.  She presented to [facility] ED 02/04/23 for increased edema over 4-6 weeks with minimal response to Lasix  once she started retaking it on 01/29/23. On arrival to the ED also had an brief episode of chest pain that self resolved. Labs on admit notable for K 5.5, Na 131, BUN/Cr 50/2.77, Ca 6.4, Bnp 1180, HGB 9.8, wbc 8.2, hs-trop 226>216. CXR showed pulmonary congestion and cardiomegal. EKG showed NSR 66 bpm, no ST abnormalities. Renal US  without acute findings. Cardiology was consulted for further evaluation of chest pain, elevated trops, and diastolic heart failure. Her Ca was replaced and she was started on IV Lasix  with intial improvement of creatinine, however urine output has remained marginal and renal function has  plateued.  Echo performed showing EF 55-60%, G1DD, evidence of RV overload, RA severely dilated, mod to sev TR.  Given RV strain there was concern for PE. SABRA D-dimer elevated and + RLE DVT. V/Qscan was performed yesterday results consistent with PE. Unable to perform CTPE with renal function. Now on a heparin  drip. AHF consulted for evaluation of cor pulmonale in the presence of presumed PE.   On exam this morning, patient remains volume overloaded. Feeling fair this morning although concerned about her condition. Reports fatigue and dyspnea on exertion. Reports that she has not noticed a significant increase in her UOP with diuresis. Appetite is okay. She is working with physical therapy.   Home Medications Prior to Admission medications   Medication Sig Start Date End Date Taking? Authorizing Provider  acetaminophen  (TYLENOL ) 325 MG tablet Take 2 tablets (650 mg total) by mouth every 4 (four) hours as needed for headache or mild pain. 10/24/20  Yes Love, Sharlet RAMAN, PA-C  aspirin  EC 81 MG tablet Take 162 mg by mouth daily. Swallow whole.   Yes [provider]  furosemide  (LASIX ) 40 MG tablet Take 1 tablet (40 mg total) by mouth 2 (two) times daily. 10/24/20  Yes Love, Sharlet RAMAN, PA-C  gabapentin  (NEURONTIN ) 400 MG capsule Take 1 capsule (400 mg total) by mouth 2 (two) times daily. 01/28/20 08/31/23 Yes Myrna Camelia HERO, NP  isosorbide  mononitrate (IMDUR ) 30 MG 24 hr tablet Take 1.5 tablets (45 mg total) by mouth daily. 10/24/20 08/15/23 Yes Love, Sharlet RAMAN, PA-C  loperamide  (IMODIUM ) 2 MG capsule Take 1 capsule (2 mg total) by mouth as needed for diarrhea or loose stools. 10/02/20  Yes Gonfa, Taye T, MD  metoprolol  succinate (TOPROL -XL) 50 MG 24 hr tablet Take 1 tablet (50 mg total) by mouth daily. Take with or immediately following a meal. 01/28/20 09/12/23 Yes King, Camelia HERO, NP  tetrahydrozoline 0.05 % ophthalmic solution Place 2 drops into both eyes 2 (two) times daily.   Yes [provider]  doxycycline  (VIBRAMYCIN ) 100 MG capsule Take 1 capsule (100 mg total) by mouth 2 (two) times daily. Patient not taking: Reported on 02/05/2023 01/19/23   Richad Jon HERO, NP    Past Medical History: Past Medical History:  Diagnosis Date   Acute exacerbation of CHF (congestive heart failure) (HCC) 04/11/2019   Anemia    Arthritis    spine, hands (11/25/2015)   Back pain    all my back; probably 3 times/week (11/25/2015)   Chronic indwelling Foley catheter 03/04/2017   Diabetic foot ulcer associated with type 2 diabetes mellitus (HCC)    Hypertension    Intracerebral hemorrhage (HCC) As a teenager    States she had burst blood vessel as teenager, now with resultant minor visual field and hearing deficits   Neuropathy    Stroke [facility]) ~ 1982   my feeling on my RLE, right lower eye vision,  & hearing out of my right ear not the same since (11/25/2015)   Type II diabetes mellitus (HCC)     Past Surgical History: Past Surgical History:  Procedure Laterality Date   AMPUTATION  11/24/2010   Procedure: AMPUTATION FOOT;  Surgeon: Norleen Armor, MD;  Location: [facility] OR;  Service: Orthopedics;  Laterality: Right;  Right  FOOT TRANS METATARSAL AMPUTATION   AMPUTATION  07/02/2011   Procedure: AMPUTATION BELOW KNEE;  Surgeon: Norleen Armor, MD;  Location: [facility] OR;  Service: Orthopedics;  Laterality: Right;   APPLICATION OF WOUND VAC  07/02/2011   Procedure: APPLICATION OF WOUND VAC;  Surgeon: Norleen Armor, MD;  Location: [facility] OR;  Service: Orthopedics;  Laterality: Right;   BRAIN SURGERY  1980's   Previous brain surgery in [place], [place]   CESAREAN SECTION  2004; 2006   COLONOSCOPY N/A 09/17/2015   Procedure: COLONOSCOPY;  Surgeon: Jerrell Sol, MD;  Location[facility] ENDOSCOPY;  Service: Endoscopy;  Laterality: N/A;   ESOPHAGOGASTRODUODENOSCOPY N/A 09/17/2015   Procedure: ESOPHAGOGASTRODUODENOSCOPY (EGD);  Surgeon: Jerrell Sol, MD;  Location: [facility] ENDOSCOPY;  Service: Endoscopy;   Laterality: N/A;   I & D EXTREMITY  11/24/2010   Procedure: IRRIGATION AND DEBRIDEMENT EXTREMITY;  Surgeon: Norleen Armor, MD;  Location: [facility] OR;  Service: Orthopedics;  Laterality: Left;  Debriedment of left plantar ulcer and trimming of toenails   I & D EXTREMITY  07/02/2011   Procedure: IRRIGATION AND DEBRIDEMENT EXTREMITY;  Surgeon: Norleen Armor, MD;  Location: [facility] OR;  Service: Orthopedics;  Laterality: Left;  I & D of Left Foot   I & D EXTREMITY  07/06/2011   Procedure: IRRIGATION AND DEBRIDEMENT EXTREMITY;  Surgeon: Norleen Armor, MD;  Location: [facility] OR;  Service: Orthopedics;  Laterality: Right;  IRRIGATION/DEBRIDEMENT RIGHT BELOW KNEE AMPUTATION   IR THORACENTESIS ASP PLEURAL SPACE W/IMG GUIDE  09/22/2020   LAPAROSCOPIC SPLENECTOMY N/A 09/18/2020   Procedure: HAND ASSISTED LAPAROSCOPIC SPLENECTOMY;  Surgeon: Aron Shoulders, MD;  Location: [facility] OR;  Service: General;  Laterality: N/A;  120 MINUTES   TUBAL LIGATION  2006    Family History: Family History  Problem Relation  Age of Onset   Diabetes Mother    Hypertension Mother    Hyperlipidemia Mother    Diabetes Father    Cancer Father        prostate   Hyperlipidemia Father    Hypertension Father    Diabetes Brother    Peripheral Artery Disease Brother        has had toes amputated   Diabetes Son     Social History: Social History   Socioeconomic History   Marital status: Widowed    Spouse name: Not on file   Number of children: 2   Years of education: Not on file   Highest education level: Some college, no degree  Occupational History   Occupation: disability  Tobacco Use   Smoking status: Never   Smokeless tobacco: Never  Vaping Use   Vaping status: Never Used  Substance and Sexual Activity   Alcohol  use: Yes    Alcohol /week: 2.0 standard drinks of alcohol     Types: 2 Glasses of wine per week   Drug use: No   Sexual activity: Not Currently    Birth control/protection: Post-menopausal  Other Topics Concern   Not on file   Social History Narrative   Widowed in 2012   Lives in a home she owns   No wheelchair ramp, though garage entry is flush to both garage and entry floor.   Unable to use full bathroom upstairs.   Has prosthesis for right LE, but never learned how to use.   Cannot see well due to cataracts.   Did not receive disability in 09/2017 with application.   Possibly due to husband's SS she receives   Social Drivers of [employer]: Low Risk  (09/15/2020)   Overall Financial Resource Strain (CARDIA)    Difficulty of Paying Living Expenses: Not very hard  Food Insecurity: Patient Unable To Answer (02/06/2023)   Hunger Vital Sign    Worried About Running Out of Food in the Last Year: Patient unable to answer    Ran Out of Food in the Last Year: Patient unable to answer  Transportation Needs: No Transportation Needs (02/06/2023)   PRAPARE - Administrator, Civil Service (Medical): No    Lack of Transportation (Non-Medical): No  Physical Activity: Not on file  Stress: Not on file  Social Connections: Not on file    Allergies:  No Known Allergies  Objective:    Vital Signs:   Temp:  [97.9 F (36.6 C)-99.5 F (37.5 C)] 97.9 F (36.6 C) (02/06 1106) Pulse Rate:  [70-71] 71 (02/06 1106) Resp:  [16] 16 (02/06 1106) BP: (99-120)/(49-73) 120/52 (02/06 1106) SpO2:  [94 %-99 %] 97 % (02/06 1106) Weight:  [105.7 kg] 105.7 kg (02/06 0346) Last BM Date : 02/09/23  Weight change: Filed Weights   02/08/23 0437 02/09/23 0353 02/10/23 0346  Weight: 95.8 kg 96.6 kg 105.7 kg    Intake/Output:   Intake/Output Summary (Last 24 hours) at 02/10/2023 1225 Last data filed at 02/10/2023 0725 Gross per 24 hour  Intake 571.63 ml  Output 200 ml  Net 371.63 ml      Physical Exam    General: Chronically-ill, weak appearing.  Cardiac: JVP to jaw. S1 and S2 present. No murmurs or rub. Abdomen: Soft, non-tender, non-distended.  Extremities: Warm and dry. No rash, cyanosis.   2+ in thigh and LE edema.  Neuro: Alert and oriented x3. Affect pleasant. Generalized weakness.  Telemetry   SR in  62s (personally reviewed)  EKG    EKG personally reviewed above  Labs   Basic Metabolic Panel: Recent Labs  Lab 02/04/23 2000 02/05/23 0724 02/06/23 0543 02/07/23 0405 02/07/23 1429 02/08/23 0447 02/09/23 0428 02/10/23 0426  NA 131*   < > 133* 132*  --  132* 128* 129*  K 5.5*   < > 4.2 4.1  --  3.9 4.0 4.0  CL 106   < > 106 107  --  101 99 101  CO2 14*   < > 13* 16*  --  16* 15* 15*  GLUCOSE 100*   < > 100* 132*  --  126* 98 113*  BUN 50*   < > 55* 56*  --  59* 63* 68*  CREATININE 2.77*   < > 2.52* 2.23*  --  2.26* 2.21* 2.37*  CALCIUM  6.4*   < > 6.5* 6.2*  --  6.4* 6.6* 6.5*  MG 2.4  --   --   --  2.0 1.9 1.9 1.9   < > = values in this interval not displayed.    Liver Function Tests: Recent Labs  Lab 02/05/23 0724 02/07/23 0405 02/08/23 0447  AST 32 25  --   ALT 20 15  --   ALKPHOS 205* 169*  --   BILITOT 1.1 0.7  --   PROT 7.3 6.7  --   ALBUMIN  2.9* 2.5* 2.7*   No results for input(s): LIPASE, AMYLASE in the last 168 hours. No results for input(s): AMMONIA in the last 168 hours.  CBC: Recent Labs  Lab 02/06/23 0543 02/07/23 0405 02/08/23 0447 02/09/23 0428 02/10/23 0426  WBC 8.1 7.3 8.2 7.7 8.2  HGB 9.2* 8.9* 9.1* 8.9* 8.9*  HCT 28.0* 26.4* 26.4* 25.9* 26.1*  MCV 93.6 90.4 90.4 89.9 90.0  PLT 214 201 214 208 207   Cardiac Enzymes: No results for input(s): CKTOTAL, CKMB, CKMBINDEX, TROPONINI in the last 168 hours.  BNP: BNP (last 3 results) Recent Labs    02/04/23 2000 02/08/23 0446  BNP 1,180.9* 1,321.8*   ProBNP (last 3 results) No results for input(s): PROBNP in the last 8760 hours.   CBG: No results for input(s): GLUCAP in the last 168 hours.  Coagulation Studies: No results for input(s): LABPROT, INR in the last 72 hours.   Imaging   US  EKG SITE RITE Result Date: 02/10/2023 If Site Rite  image not attached, placement could not be confirmed due to current cardiac rhythm.  NM Pulmonary Perfusion Result Date: 02/09/2023 CLINICAL DATA:  Pulmonary hypertension suspected.  Leg swelling EXAM: NUCLEAR MEDICINE PERFUSION LUNG SCAN TECHNIQUE: Perfusion images were obtained in multiple projections after intravenous injection of radiopharmaceutical. Ventilation scans intentionally deferred if perfusion scan and chest x-ray adequate for interpretation during COVID 19 epidemic. RADIOPHARMACEUTICALS:  4.3 mCi Tc-21m MAA IV COMPARISON:  Chest radiograph 02/09/2023 FINDINGS: Wedge-shaped perfusion defects are demonstrated in the superior and posterior basal segments of the right lower lung and posterior basal segment of the left lower lung. IMPRESSION: Perfusion defects are demonstrated in both lower lungs consistent with presence of pulmonary embolus by PISAPED criteria. Electronically Signed   By: Elsie Gravely M.D.   On: 02/09/2023 19:09   DG CHEST PORT 1 VIEW Result Date: 02/09/2023 CLINICAL DATA:  Pulmonary hypertension.  Lower extremity swelling. EXAM: PORTABLE CHEST 1 VIEW COMPARISON:  Chest radiograph dated 02/04/2023. FINDINGS: There is cardiomegaly with mild central vascular congestion. No focal consolidation, pleural effusion, pneumothorax. No acute osseous pathology. IMPRESSION: Cardiomegaly with mild  central vascular congestion. Electronically Signed   By: Vanetta Chou M.D.   On: 02/09/2023 16:22   Medications:    Current Medications:  calcitRIOL   0.25 mcg Oral Daily   calcium  carbonate  1,000 mg of elemental calcium  Oral TID WC   gabapentin   400 mg Oral BID   Gerhardt's butt cream   Topical TID   influenza vac split trivalent PF  0.5 mL Intramuscular Tomorrow-1000   isosorbide  mononitrate  45 mg Oral Daily   sodium bicarbonate   650 mg Oral TID   Vitamin D  (Ergocalciferol )  50,000 Units Oral Q7 days    Infusions:  cefTRIAXone  (ROCEPHIN )  IV 2 g (02/10/23 0908)   furosemide       heparin  1,950 Units/hr (02/10/23 0725)   metronidazole  500 mg (02/10/23 1211)   milrinone      Patient Profile   SUZANE VANDERWEIDE is a 58 y.o. female with a hx of ITP s/p splenectomy 2022, DM2, hypertension, anemia, BKA, HFpEF, CVA, ICH, PVD, CKD 3A, neurogenic bladder with chronic indwelling catheter, poor mobility, and poor compliance.   Admitted for RV failure.   Assessment/Plan   Cor Pulmonale - evidence of RV strain on echo 2/1 - in the setting of PE - PE treatment as in #3 - place PICC line for CVP monitoring/milrinone  - start milrinone  0.25 mcg/kg/min to assist with RV strain and diuresis - poor response to IV lasix  this am. Given Lasix  120 mg IV this afternoon and follow response  HFpEF - History of poor compliance with diuresis - Echo 6/22: EF 60-65, RV nl - Repeat Echo EF 55-60%, G1DD, evidence of RV overload, RA severely dilated, mod to sev TR - diuresis as above - GDMT limited by renal function  PE DVT - R>L lower extremity edema - positive d-dimer and BLE doppler + for R popliteal vein DVT - V/Q scan consistent with PE - unable to perform CTPE with renal function - continue heparin  gtt  AKI on CKD 3 vs. 4 - baseline unknown. Last Cr 1.4 in 2022 - component of cardio-renal in the setting of RV failure - renal US  unremarkable - initially started to improve with diuresis although has plateau'd - Cr 2.37 today - milrinone  + diuresis as above  HTN - normotensive on current regimen - continue imdur  45 mg daily - CTM with starting milrinone    Elevated troponin - hs-trop 226>216 likely demand ischemia - presented with atypical CP (1 time episode of chest pain that self resolved and lasted a few seconds). CP likely related to PE - EKG without changes in ST segment  Hypocalcemia - calcium  replacement per primary  Hypervolemia Hypernatremia - Na 129 - diuresis as above  Length of Stay: 5  Eliberto Sole, NP  02/10/2023, 12:25 PM  Advanced Heart Failure  Team Pager [phone] (M-F; 7a - 5p)  Please contact [facility] for night-coverage after hours (4p -7a ) and weekends on [email or web address]

## 2023-02-10 NOTE — Progress Notes (Signed)
 Patient ID: [name], female   DOB: 1965-10-20, 58 y.o.   MRN: [id number] [facility] Progress Note   Assessment/ Plan:   1.  Acute kidney injury on chronic kidney disease stage III: This appears to be most likely cardiorenal in nature in the setting of CHF exacerbation/renal vascular congestion with high likelihood of progression of her underlying chronic kidney disease over the past 2 years.  Slight BUN/creatinine rise noted overnight with increased diuretic dose; switched to furosemide  80 mg IV twice daily yesterday evening.  Unfortunately urine output is incompletely charted and weight appears grossly inaccurate to assess for success with management.  Continue current diuretic dose with ongoing monitoring. 2.  Hypocalcemia: Refractory to earlier efforts at medical management.  PTH level pending and started on calcitriol  empirically along with ergocalciferol  for severe vitamin D  deficiency. 3.  Acute exacerbation of congestive heart failure: Yesterday increased furosemide  to 80 mg IV twice daily with unclear response based on urine output/weights.  Plans noted for initiation of milrinone  today to confer some inotropic support which would hopefully improve efficacy of diuresis. 4.  Hyponatremia: Appears most consistent with inappropriate ADH state in the setting of CHF exacerbation.  Monitor with diuresis and limit free water  intake.  Subjective:   No acute events overnight, continues to have leg swelling that is seemingly unchanged.   Objective:   BP (!) 120/52 (BP Location: Left Arm)   Pulse 71   Temp 97.9 F (36.6 C) (Oral)   Resp 16   Ht 5' 7 (1.702 m)   Wt 105.7 kg   LMP  (LMP Unknown)   SpO2 97%   BMI 36.51 kg/m   Intake/Output Summary (Last 24 hours) at 02/10/2023 1145 Last data filed at 02/10/2023 0725 Gross per 24 hour  Intake 571.63 ml  Output 200 ml  Net 371.63 ml   Weight change: 9.133 kg  Physical Exam: Gen: Comfortably resting in bed,  CVS:  Pulse regular rhythm, normal rate, S1 and S2 normal Resp: Clear to auscultation bilaterally without rales/rhonchi Abd: Soft, obese, nontender Ext: 1+ right BKA stump edema with 2+ left leg edema extending to just above the knee  Imaging: NM Pulmonary Perfusion Result Date: 02/09/2023 CLINICAL DATA:  Pulmonary hypertension suspected.  Leg swelling EXAM: NUCLEAR MEDICINE PERFUSION LUNG SCAN TECHNIQUE: Perfusion images were obtained in multiple projections after intravenous injection of radiopharmaceutical. Ventilation scans intentionally deferred if perfusion scan and chest x-ray adequate for interpretation during COVID 19 epidemic. RADIOPHARMACEUTICALS:  4.3 mCi Tc-63m MAA IV COMPARISON:  Chest radiograph 02/09/2023 FINDINGS: Wedge-shaped perfusion defects are demonstrated in the superior and posterior basal segments of the right lower lung and posterior basal segment of the left lower lung. IMPRESSION: Perfusion defects are demonstrated in both lower lungs consistent with presence of pulmonary embolus by PISAPED criteria. Electronically Signed   By: Elsie Gravely M.D.   On: 02/09/2023 19:09   DG CHEST PORT 1 VIEW Result Date: 02/09/2023 CLINICAL DATA:  Pulmonary hypertension.  Lower extremity swelling. EXAM: PORTABLE CHEST 1 VIEW COMPARISON:  Chest radiograph dated 02/04/2023. FINDINGS: There is cardiomegaly with mild central vascular congestion. No focal consolidation, pleural effusion, pneumothorax. No acute osseous pathology. IMPRESSION: Cardiomegaly with mild central vascular congestion. Electronically Signed   By: Vanetta Chou M.D.   On: 02/09/2023 16:22    Labs: BMET Recent Labs  Lab 02/04/23 2000 02/05/23 0724 02/06/23 0543 02/07/23 0405 02/08/23 0447 02/09/23 0428 02/10/23 0426  NA 131* 132* 133* 132* 132* 128* 129*  K 5.5*  5.4* 4.2 4.1 3.9 4.0 4.0  CL 106 107 106 107 101 99 101  CO2 14* 12* 13* 16* 16* 15* 15*  GLUCOSE 100* 90 100* 132* 126* 98 113*  BUN 50* 52* 55* 56* 59*  63* 68*  CREATININE 2.77* 2.88* 2.52* 2.23* 2.26* 2.21* 2.37*  CALCIUM  6.4* 6.7* 6.5* 6.2* 6.4* 6.6* 6.5*   CBC Recent Labs  Lab 02/07/23 0405 02/08/23 0447 02/09/23 0428 02/10/23 0426  WBC 7.3 8.2 7.7 8.2  HGB 8.9* 9.1* 8.9* 8.9*  HCT 26.4* 26.4* 25.9* 26.1*  MCV 90.4 90.4 89.9 90.0  PLT 201 214 208 207    Medications:     calcitRIOL   0.25 mcg Oral Daily   calcium  carbonate  1,000 mg of elemental calcium  Oral TID WC   furosemide   80 mg Intravenous BID   gabapentin   400 mg Oral BID   Gerhardt's butt cream   Topical TID   influenza vac split trivalent PF  0.5 mL Intramuscular Tomorrow-1000   isosorbide  mononitrate  45 mg Oral Daily   metoprolol  succinate  50 mg Oral Daily   sodium bicarbonate   650 mg Oral TID   Vitamin D  (Ergocalciferol )  50,000 Units Oral Q7 days    Gordy Blanch, MD 02/10/2023, 11:45 AM

## 2023-02-10 NOTE — Plan of Care (Signed)
   Problem: Education: Goal: Knowledge of General Education information will improve Description Including pain rating scale, medication(s)/side effects and non-pharmacologic comfort measures Outcome: Progressing   Problem: Health Behavior/Discharge Planning: Goal: Ability to manage health-related needs will improve Outcome: Progressing

## 2023-02-10 NOTE — Progress Notes (Addendum)
 [facility]                                                                              [name], is a 58 y.o. female, DOB - [birth year], FMW:[id number] Admit date - 02/04/2023    Outpatient Primary MD for the patient is System, Provider Not In  LOS - 5  days  Chief Complaint  Patient presents with   Leg Swelling       Brief summary   Patient is a 58 year old female with ITP, diabetes mellitus type 2, CKD stage IIIa, chronic HFpEF, hypertension, CVA, ICH, PVD, right BKA, neurogenic bladder/chronic Foley presented to ED with bilateral lower extremity edema.  Patient reported 1 week of progressively worsening lower extremity edema up to her thighs.  Reported that it was getting difficult for her to get up from the wheelchair as her legs felt tight.  She had been taking Lasix  40 mg twice daily at home but not having much urine output.  Denied any shortness of breath or cough.  She also had a brief episode of left-sided chest pain.  In ED, hemoglobin 9.8, sodium 131, K5.5, bicarb 14, glucose 100, creatinine 2.7, calcium  6.4, mag 2.4, BNP 1180 Chest x-ray showed cardiomegaly with central congestion, pleural effusion. Received IV Lasix  40 mg and IV calcium  gluconate 1 g.   Troponin 226 -  216  Assessment & Plan    Principal Problem:   Acute on chronic heart failure with preserved ejection fraction (HFpEF) (HCC) Elevated troponin likely due to demand ischemia - Echo 06/2020: EF 60 to 65%, mild MVR.  -presented with significant bilateral lower extremity edema up to her thighs.  BNP 1180. Troponin 226 -  216. Cr 2.7, ?cardiorenal syndrome with anasarca -2D echo shows EF of 55 to 60%, no regional WMA, right ventricular pressure overload, G1 DD, low normal right ventricular systolic function, severely elevated pulmonary artery systolic pressure. -Cardiology, nephrology following, placed on aggressive IV diuresis.  Per cardiology, needs inotropic support for the RV,  aggressive diuresis and treatment of PE.  Awaiting right heart cath in the setting of active PE -PICC line for milrinone   Active Problems:   AKI on CKD stage IIIa, metabolic acidosis, hyperkalemia  - possibly cardiorenal in the setting of acutely decompensated CHF.  - Cr 1.2 on labs done over 2 years ago, couldn't find recent labs in care everywhere. -Placed on aggressive diuresis, creatinine 2.3, nephrology following.   Hypocalcemia, prolonged QTc --Refractory to replacements -Severe vitamin D  deficiency 6.7, started on vitamin D  50,000 units weekly - PTH 373 -Improving, started on oral calcium  replacement, calcitriol .  Nephrology following   Acute RLE DVT, acute PE Severe pulmonary hypertension -Elevated D-dimer.  Lower extremity Doppler positive for age-indeterminate DVT involving the right popliteal vein, no DVT in the LLE -Renal insufficiency precludes CT with contrast to rule out PE however given decreased right ventricular systolic function, severely elevated pulmonary artery systolic pressure, likely has underlying PE as well.   -VQ scan showed perfusion defects in both lower lungs consistent with presence of pulmonary embolus -Continue IV heparin  drip  Diarrhea -C. difficile negative, GI pathogen panel negative. -  CT abdomen pelvis showed anasarca with diffuse mesenteric edema, otherwise no colitis or acute abdominal pathology -Placed on IV Rocephin , Flagy x 5days, Imodium  prn  -Improving    Hypervolemic hyponatremia -BNP 1321, nephrology following -On aggressive diuresis, sodium improving  Hyperkalemia -Resolved  Essential HTN (hypertension) -BP stable.   -Continue IV Lasix , Imdur , metoprolol     Diabetes mellitus (HCC), diet controlled, -Hemoglobin A1c 5.4 -Continue follow-up   Chronic normocytic anemia -Baseline hemoglobin 9-10 -H&H stable  Pressure injury, left buttocks, stage I POA -Wound care per nursing  Obesity class II Estimated body mass index is  36.51 kg/m as calculated from the following:   Height as of this encounter: 5' 7 (1.702 m).   Weight as of this encounter: 105.7 kg.  Code Status: Full code DVT Prophylaxis:     Level of Care: Level of care: Telemetry Cardiac Family Communication: Updated patient Disposition Plan:      Remains inpatient appropriate:      Procedures:    Consultants:   Cardiology  Antimicrobials:   Anti-infectives (From admission, onward)    Start     Dose/Rate Route Frequency Ordered Stop   02/07/23 0800  cefTRIAXone  (ROCEPHIN ) 2 g in sodium chloride  0.9 % 100 mL IVPB        2 g 200 mL/hr over 30 Minutes Intravenous Every 24 hours 02/06/23 0713     02/06/23 0800  metroNIDAZOLE  (FLAGYL ) IVPB 500 mg        500 mg 100 mL/hr over 60 Minutes Intravenous Every 12 hours 02/06/23 0704     02/06/23 0715  cefTRIAXone  (ROCEPHIN ) 2 g in sodium chloride  0.9 % 100 mL IVPB        2 g 200 mL/hr over 30 Minutes Intravenous  Once 02/06/23 0704 02/06/23 1037          Medications  calcitRIOL   0.25 mcg Oral Daily   calcium  carbonate  1,000 mg of elemental calcium  Oral TID WC   gabapentin   400 mg Oral BID   Gerhardt's butt cream   Topical TID   influenza vac split trivalent PF  0.5 mL Intramuscular Tomorrow-1000   isosorbide  mononitrate  45 mg Oral Daily   sodium bicarbonate   650 mg Oral TID   Vitamin D  (Ergocalciferol )  50,000 Units Oral Q7 days      Subjective:   [name] was seen and examined today.  Diarrhea improved.  Continues to have lower extremity swelling, unchanged.  No acute chest pain or shortness of breath.   Objective:   Vitals:   02/09/23 1224 02/09/23 2100 02/10/23 0346 02/10/23 1106  BP: (!) 114/47 99/73 (!) 118/49 (!) 120/52  Pulse: 69 70 70 71  Resp: 18 16 16 16   Temp: 97.9 F (36.6 C) 98.2 F (36.8 C) 99.5 F (37.5 C) 97.9 F (36.6 C)  TempSrc: Oral Oral Oral Oral  SpO2:  99% 94% 97%  Weight:   105.7 kg   Height:        Intake/Output Summary (Last 24  hours) at 02/10/2023 1228 Last data filed at 02/10/2023 0725 Gross per 24 hour  Intake 371.63 ml  Output 200 ml  Net 171.63 ml      Wt Readings from Last 3 Encounters:  02/10/23 105.7 kg  10/22/20 103 kg  10/02/20 107 kg   Physical Exam General: Alert and oriented x 3, NAD Cardiovascular: S1 S2 clear, RRR.  Respiratory: CTAB, no wheezing Gastrointestinal: Soft, nontender, nondistended, NBS Ext: 1+ right BKA stump with LLE edema to  knees.  Neuro: no new deficits Psych: Normal affect        Data Reviewed:  I have personally reviewed following labs    CBC Lab Results  Component Value Date   WBC 8.2 02/10/2023   RBC 2.90 (L) 02/10/2023   HGB 8.9 (L) 02/10/2023   HCT 26.1 (L) 02/10/2023   MCV 90.0 02/10/2023   MCH 30.7 02/10/2023   PLT 207 02/10/2023   MCHC 34.1 02/10/2023   RDW 16.4 (H) 02/10/2023   LYMPHSABS 1.3 11/17/2020   MONOABS 0.8 11/17/2020   EOSABS 0.1 11/17/2020   BASOSABS 0.1 11/17/2020     Last metabolic panel Lab Results  Component Value Date   NA 129 (L) 02/10/2023   K 4.0 02/10/2023   CL 101 02/10/2023   CO2 15 (L) 02/10/2023   BUN 68 (H) 02/10/2023   CREATININE 2.37 (H) 02/10/2023   GLUCOSE 113 (H) 02/10/2023   GFRNONAA 23 (L) 02/10/2023   GFRAA 59 (L) 09/19/2019   CALCIUM  6.5 (L) 02/10/2023   PHOS 2.9 10/01/2020   PROT 6.7 02/07/2023   ALBUMIN  2.7 (L) 02/08/2023   LABGLOB 4.0 05/16/2019   AGRATIO 1.1 (L) 05/16/2019   BILITOT 0.7 02/07/2023   ALKPHOS 169 (H) 02/07/2023   AST 25 02/07/2023   ALT 15 02/07/2023   ANIONGAP 13 02/10/2023    CBG (last 3)  No results for input(s): GLUCAP in the last 72 hours.    Coagulation Profile: No results for input(s): INR, PROTIME in the last 168 hours.   Radiology Studies: I have personally reviewed the imaging studies  US  EKG SITE RITE Result Date: 02/10/2023 If Site Rite image not attached, placement could not be confirmed due to current cardiac rhythm.  NM Pulmonary  Perfusion Result Date: 02/09/2023 CLINICAL DATA:  Pulmonary hypertension suspected.  Leg swelling EXAM: NUCLEAR MEDICINE PERFUSION LUNG SCAN TECHNIQUE: Perfusion images were obtained in multiple projections after intravenous injection of radiopharmaceutical. Ventilation scans intentionally deferred if perfusion scan and chest x-ray adequate for interpretation during COVID 19 epidemic. RADIOPHARMACEUTICALS:  4.3 mCi Tc-10m MAA IV COMPARISON:  Chest radiograph 02/09/2023 FINDINGS: Wedge-shaped perfusion defects are demonstrated in the superior and posterior basal segments of the right lower lung and posterior basal segment of the left lower lung. IMPRESSION: Perfusion defects are demonstrated in both lower lungs consistent with presence of pulmonary embolus by PISAPED criteria. Electronically Signed   By: Elsie Gravely M.D.   On: 02/09/2023 19:09   DG CHEST PORT 1 VIEW Result Date: 02/09/2023 CLINICAL DATA:  Pulmonary hypertension.  Lower extremity swelling. EXAM: PORTABLE CHEST 1 VIEW COMPARISON:  Chest radiograph dated 02/04/2023. FINDINGS: There is cardiomegaly with mild central vascular congestion. No focal consolidation, pleural effusion, pneumothorax. No acute osseous pathology. IMPRESSION: Cardiomegaly with mild central vascular congestion. Electronically Signed   By: Vanetta Chou M.D.   On: 02/09/2023 16:22       Hanne Kegg M.D. [facility] 02/10/2023, 12:28 PM  Available via Epic secure chat 7am-7pm After 7 pm, please refer to night coverage provider listed on amion.

## 2023-02-10 NOTE — Progress Notes (Signed)
 Spoke with Dr. Lydia Sams regarding PICC placement. Received clearance to place the PICC, using the dominant arm.

## 2023-02-10 NOTE — Progress Notes (Signed)
 Physical Therapy Treatment Patient Details Name: Cheryl GAUTHREAUX MRN: [id number] DOB: 1965/04/28 Today's Date: 02/10/2023   History of Present Illness Pt is a 58 y.o. female who presented 02/05/23 with bil lower extremity edema. Admitted with acute on chronic diastolic heart failure. Imaging 2/5 showed PE. Pt also with DVT. Heparin  initiated 2/3. PMH: ITP, DM2, CKD stage IIIa, chronic HFpEF, HTN, CVA, ICH, PVD, right BKA, neurogenic bladder/chronic Foley    PT Comments  Focused session on transfer training and developing and educating pt on a HEP (provided handout and therabands) to address her lower extremity strength deficits to ideally improve her independence with functional mobility. The pt is still demonstrating edema in her legs along with global weakness, placing her at risk for falls, and resulting in her needing modA to squat pivot between surfaces. Held an extended discussion with pt in regards to current functional status, when she may be medically cleared vs physically ready to go home, rehab options etc. At this time, pt seemed agreeable to SNF if CIR is not an option. However, if she improves to being independent with transfers by the time she is medically cleared then she would prefer to d/c home instead of to a SNF. Notified TOC. Will continue to follow acutely.    If plan is discharge home, recommend the following: A little help with walking and/or transfers;A little help with bathing/dressing/bathroom;Assistance with cooking/housework;Direct supervision/assist for financial management;Direct supervision/assist for medications management;Assist for transportation;Help with stairs or ramp for entrance;Supervision due to cognitive status   Can travel by private vehicle     No  Equipment Recommendations  None recommended by PT    Recommendations for Other Services       Precautions / Restrictions Precautions Precautions: Fall Precaution Comments: R BKA (prosthesis not  present) Restrictions Weight Bearing Restrictions Per Provider Order: No     Mobility  Bed Mobility Overal bed mobility: Needs Assistance Bed Mobility: Supine to Sit     Supine to sit: Supervision, HOB elevated, Used rails     General bed mobility comments: Extra time and use of rails to pull up and pivot to sit R EOB, HOB elevated, supervision for safety    Transfers Overall transfer level: Needs assistance Equipment used: None, 1 person hand held assist Transfers: Bed to chair/wheelchair/BSC, Sit to/from Stand Sit to Stand: Mod assist     Squat pivot transfers: Mod assist, Max assist     General transfer comment: Practiced squat pivot to her L from EOB to w/c with arm rest removed. Upon first attempt, the w/c began to move during the transfer and pt needed maxA to ensure she safely sat back on the EOB. Upon second attempt, pt able to complete squat pivot to w/c safely with modA and w/c held in place by PT. Pt perfromed a second squat pivot, this time to the R, from w/c to recliner. Pt held onto therapist anterior to her for better support this rep, modA to clear buttocks and pivot to chair. Pt able to come to half stand with HHA and modA    Ambulation/Gait               General Gait Details: unable at baseline   Stairs             Wheelchair Mobility     Tilt Bed    Modified Rankin (Stroke Patients Only)       Balance Overall balance assessment: Needs assistance Sitting-balance support: No upper extremity supported, Feet supported  Sitting balance-Leahy Scale: Fair     Standing balance support: Bilateral upper extremity supported Standing balance-Leahy Scale: Poor Standing balance comment: Reliant on UE support and external physical assistance to come to a half stand                            Cognition Arousal: Alert Behavior During Therapy: WFL for tasks assessed/performed, Anxious Overall Cognitive Status: Impaired/Different from  baseline Area of Impairment: Following commands, Problem solving, Memory, Safety/judgement                     Memory: Decreased short-term memory Following Commands: Follows one step commands consistently, Follows one step commands with increased time, Follows multi-step commands inconsistently Safety/Judgement: Decreased awareness of deficits   Problem Solving: Slow processing, Difficulty sequencing, Requires verbal cues, Requires tactile cues General Comments: Pt had difficulty understanding she is unable to clear her buttocks to scoot/pivot laterally currently, which would make it unsafe for a stand and 180' turn pivot to transfer from bed to w/c directly anterior to her. Slow to process cues and needs repetition of cues intermittently.        Exercises General Exercises - Lower Extremity Long Arc Quad: AROM, Strengthening, Both, 10 reps, Seated Hip ABduction/ADduction: AROM, Strengthening, Both, 10 reps, Seated (pillow squeezes) Other Exercises Other Exercises: pt performed x3-5 reps of each of the following exercises in sitting: bil hip abduction with red theraband around thighs, bil knee extension with red theraband around lower legs, bil glut sets, L leg extension with red theraband under foot and held on ends by hands superior to leg, marching with red theraband around thighs    General Comments General comments (skin integrity, edema, etc.): Provided pt with [employer] lower extremity HEP handout with Access Code: [email or web address]; provided pt with red and green therabands; long discussion held with pt in regards to current functional status, when she may be medically cleared vs physically ready to go home, rehab options etc, pt seemed agreeable to SNF if CIR is not an option but improves to being independent with transfers by the time she is medically cleared then she would prefer to d/c home instead, notified TOC      Pertinent Vitals/Pain Pain Assessment Pain Assessment:  Faces Faces Pain Scale: Hurts little more Pain Location: legs with edema Pain Descriptors / Indicators: Discomfort, Grimacing Pain Intervention(s): Monitored during session, Limited activity within patient's tolerance, Repositioned    Home Living                          Prior Function            PT Goals (current goals can now be found in the care plan section) Acute Rehab PT Goals Patient Stated Goal: to get stronger PT Goal Formulation: With patient Time For Goal Achievement: 02/20/23 Potential to Achieve Goals: Good Progress towards PT goals: Progressing toward goals    Frequency    Min 1X/week      PT Plan      Co-evaluation              AM-PAC PT 6 Clicks Mobility   Outcome Measure  Help needed turning from your back to your side while in a flat bed without using bedrails?: A Little Help needed moving from lying on your back to sitting on the side of a flat bed without using bedrails?: A Little Help needed moving  to and from a bed to a chair (including a wheelchair)?: A Lot Help needed standing up from a chair using your arms (e.g., wheelchair or bedside chair)?: A Lot Help needed to walk in hospital room?: Total Help needed climbing 3-5 steps with a railing? : Total 6 Click Score: 12    End of Session Equipment Utilized During Treatment: Gait belt Activity Tolerance: Patient tolerated treatment well Patient left: in chair;with call bell/phone within reach;with chair alarm set Nurse Communication: Need for lift equipment PT Visit Diagnosis: Unsteadiness on feet (R26.81);Muscle weakness (generalized) (M62.81);Difficulty in walking, not elsewhere classified (R26.2)     Time: 8880-8788 PT Time Calculation (min) (ACUTE ONLY): 52 min  Charges:    $Therapeutic Exercise: 8-22 mins $Therapeutic Activity: 23-37 mins PT General Charges $$ ACUTE PT VISIT: 1 Visit                     Theo Ferretti, PT, DPT Acute Rehabilitation  Services  Office: [phone]    Theo CHRISTELLA Ferretti 02/10/2023, 2:24 PM

## 2023-02-10 NOTE — TOC Progression Note (Signed)
 Transition of Care [facility]) - Progression Note    Patient Details  Name: [name] MRN: [id number] Date of Birth: May 15, 1965  Transition of Care [facility] CM/SW Contact  Arlana JINNY Nicholaus [place] Phone Number: [phone] 02/10/2023, 1:40 PM  Clinical Narrative:   [facility] CSW met with pt at bedside. Pt inquired about CIR and if that was an option for rehab. Pt stated that she has used CIR in the past and would prefer that over HHPT. Pt stated that she was concerned about the fluid in her legs, which she feels is making it difficilt to walk. Pt stated that she has discussed with PT. At this time she has no other questions.   TOC will continue following.     Expected Discharge Plan: Home w Home Health Services Barriers to Discharge: Continued Medical Work up  Expected Discharge Plan and Services In-house Referral: Clinical Social Work Discharge Planning Services: CM Consult Post Acute Care Choice: Home Health Living arrangements for the past 2 months: Single Family Home                 DME Arranged: N/A         HH Arranged: PT, OT HH Agency: [facility] Date [facility] Agency Contacted: 02/07/23 Time HH Agency Contacted: 1632 Representative spoke with at [facility] Agency: Darleene   Social Determinants of Health (SDOH) Interventions SDOH Screenings   Food Insecurity: Patient Unable To Answer (02/06/2023)  Housing: Unknown (02/06/2023)  Transportation Needs: No Transportation Needs (02/06/2023)  Utilities: Not At Risk (02/06/2023)  Alcohol  Screen: Low Risk  (09/15/2020)  Depression (PHQ2-9): Low Risk  (04/30/2019)  Financial Resource Strain: Low Risk  (09/15/2020)  Tobacco Use: Low Risk  (02/07/2023)    Readmission Risk Interventions     No data to display

## 2023-02-10 NOTE — Progress Notes (Signed)
 PHARMACY - ANTICOAGULATION Pharmacy Consult for heparin  Indication: DVT/PE Brief A/P: Heparin  level subtherapeutic Increase Heparin  rate  No Known Allergies  Patient Measurements: Height: 5' 7 (170.2 cm) Weight: 105.7 kg (233 lb 1.6 oz) IBW/kg (Calculated) : 61.6 Heparin  Dosing Weight: 85kg  Vital Signs: Temp: 99.5 F (37.5 C) (02/06 0346) Temp Source: Oral (02/06 0346) BP: 118/49 (02/06 0346) Pulse Rate: 70 (02/06 0346)  Labs: Recent Labs    02/08/23 0447 02/08/23 1240 02/09/23 0428 02/09/23 1611 02/10/23 0426  HGB 9.1*  --  8.9*  --  8.9*  HCT 26.4*  --  25.9*  --  26.1*  PLT 214  --  208  --  207  HEPARINUNFRC 0.44   < > 0.19* 0.13* 0.24*  CREATININE 2.26*  --  2.21*  --   --    < > = values in this interval not displayed.    Estimated Creatinine Clearance: 35.5 mL/min (A) (by C-G formula based on SCr of 2.21 mg/dL (H)).  Assessment: 58 y.o. female with DVT and bilateral PE on VQ scan for heparin   Goal of Therapy:  Heparin  level 0.3-0.7 units/ml Monitor platelets by anticoagulation protocol: Yes   Heparin  2000 units IV bolus, then increase heparin   1950 units/hr Check heparin  level in 8 hours.   Cathlyn Arrant, PharmD, BCPS

## 2023-02-10 NOTE — Progress Notes (Signed)
 Heart Failure Navigator Progress Note  Assessed for Heart & Vascular TOC clinic readiness.  Patient does not meet criteria due to Advanced Heart Failure Team consulted..   Navigator will sign off at this time.    Stephane Haddock, [name], Scientist, clinical (histocompatibility and immunogenetics) Only

## 2023-02-10 NOTE — Progress Notes (Signed)
 PHARMACY - ANTICOAGULATION CONSULT NOTE  Pharmacy Consult for heparin  Indication: DVT  No Known Allergies  Patient Measurements: Height: 5' 7 (170.2 cm) Weight: 105.7 kg (233 lb 1.6 oz) IBW/kg (Calculated) : 61.6 Heparin  Dosing Weight: 85kg  Vital Signs: Temp: 97.9 F (36.6 C) (02/06 1106) Temp Source: Oral (02/06 1106) BP: 120/52 (02/06 1106) Pulse Rate: 71 (02/06 1106)  Labs: Recent Labs    02/08/23 0447 02/08/23 1240 02/09/23 0428 02/09/23 1611 02/10/23 0426 02/10/23 1409  HGB 9.1*  --  8.9*  --  8.9*  --   HCT 26.4*  --  25.9*  --  26.1*  --   PLT 214  --  208  --  207  --   HEPARINUNFRC 0.44   < > 0.19* 0.13* 0.24* 0.70  CREATININE 2.26*  --  2.21*  --  2.37*  --    < > = values in this interval not displayed.    Estimated Creatinine Clearance: 33.1 mL/min (A) (by C-G formula based on SCr of 2.37 mg/dL (H)).  Assessment: 58 yo female admitted for CHF and doppler positive for DVT.  VQ shows perfusion defects both lower lungs consistent with presence of PE.  Pharmacy dosing heparin .   PM: heparin  level 0.7 (upper end of goal) on 1950 units/hr. No issues with the infusion or bleeding reported.  Goal of Therapy:  Heparin  level 0.3-0.7 units/ml Monitor platelets by anticoagulation protocol: Yes   Plan:  Decrease heparin  infusion slightly to 1900 units/hr to avoid overshooting Daily heparin  level and CBC F/u with oral AC  Rocky Slade, PharmD, BCPS Clinical Pharmacist **Pharmacist phone directory can now be found on [email or web address] ([email or web address]).  Listed under [facility].

## 2023-02-11 DIAGNOSIS — I5033 Acute on chronic diastolic (congestive) heart failure: Secondary | ICD-10-CM | POA: Diagnosis not present

## 2023-02-11 LAB — BASIC METABOLIC PANEL
Anion gap: 13 (ref 5–15)
Anion gap: 13 (ref 5–15)
BUN: 70 mg/dL — ABNORMAL HIGH (ref 6–20)
BUN: 73 mg/dL — ABNORMAL HIGH (ref 6–20)
CO2: 15 mmol/L — ABNORMAL LOW (ref 22–32)
CO2: 15 mmol/L — ABNORMAL LOW (ref 22–32)
Calcium: 6.7 mg/dL — ABNORMAL LOW (ref 8.9–10.3)
Calcium: 7 mg/dL — ABNORMAL LOW (ref 8.9–10.3)
Chloride: 98 mmol/L (ref 98–111)
Chloride: 98 mmol/L (ref 98–111)
Creatinine, Ser: 2.3 mg/dL — ABNORMAL HIGH (ref 0.44–1.00)
Creatinine, Ser: 2.43 mg/dL — ABNORMAL HIGH (ref 0.44–1.00)
GFR, Estimated: 24 mL/min — ABNORMAL LOW (ref >60)
GFR, Estimated: 23 mL/min — ABNORMAL LOW (ref >60)
Glucose, Bld: 193 mg/dL — ABNORMAL HIGH (ref 70–99)
Glucose, Bld: 200 mg/dL — ABNORMAL HIGH (ref 70–99)
Potassium: 3.4 mmol/L — ABNORMAL LOW (ref 3.5–5.1)
Potassium: 3.9 mmol/L (ref 3.5–5.1)
Sodium: 126 mmol/L — ABNORMAL LOW (ref 135–145)
Sodium: 126 mmol/L — ABNORMAL LOW (ref 135–145)

## 2023-02-11 LAB — CBC
HCT: 23.6 % — ABNORMAL LOW (ref 36.0–46.0)
Hemoglobin: 8 g/dL — ABNORMAL LOW (ref 12.0–15.0)
MCH: 30.7 pg (ref 26.0–34.0)
MCHC: 33.9 g/dL (ref 30.0–36.0)
MCV: 90.4 fL (ref 80.0–100.0)
Platelets: 194 10*3/uL (ref 150–400)
RBC: 2.61 MIL/uL — ABNORMAL LOW (ref 3.87–5.11)
RDW: 16.5 % — ABNORMAL HIGH (ref 11.5–15.5)
WBC: 8.8 10*3/uL (ref 4.0–10.5)
nRBC: 0.8 % — ABNORMAL HIGH (ref 0.0–0.2)

## 2023-02-11 LAB — MAGNESIUM: Magnesium: 1.7 mg/dL (ref 1.7–2.4)

## 2023-02-11 LAB — HEPARIN LEVEL (UNFRACTIONATED)
Heparin Unfractionated: 0.69 [IU]/mL (ref 0.30–0.70)
Heparin Unfractionated: 0.77 [IU]/mL — ABNORMAL HIGH (ref 0.30–0.70)

## 2023-02-11 LAB — COOXEMETRY PANEL
Carboxyhemoglobin: 0.9 % (ref 0.5–1.5)
Methemoglobin: <0.7 % (ref 0.0–1.5)
O2 Saturation: 81.7 %
Total hemoglobin: 8.3 g/dL — ABNORMAL LOW (ref 12.0–16.0)

## 2023-02-11 MED ORDER — HEPARIN (PORCINE) 25000 UT/250ML-% IV SOLN
1750.0000 [IU]/h | INTRAVENOUS | Status: DC
  Administered 2023-02-12 – 2023-02-18 (×12): 1750 [IU]/h via INTRAVENOUS
  Filled 2023-02-11 (×11): qty 250

## 2023-02-11 MED ORDER — MAGNESIUM SULFATE 2 GM/50ML IV SOLN
2.0000 g | Freq: Once | INTRAVENOUS | Status: AC
Start: 2023-02-11 — End: 2023-02-11
  Administered 2023-02-11: 2 g via INTRAVENOUS
  Filled 2023-02-11: qty 50

## 2023-02-11 MED ORDER — POTASSIUM CHLORIDE CRYS ER 20 MEQ PO TBCR
40.0000 meq | EXTENDED_RELEASE_TABLET | ORAL | Status: AC
  Administered 2023-02-11 (×2): 40 meq via ORAL
  Filled 2023-02-11 (×2): qty 4
  Filled 2023-02-11: qty 2

## 2023-02-11 MED ORDER — FUROSEMIDE 10 MG/ML IJ SOLN
120.0000 mg | Freq: Once | INTRAVENOUS | Status: AC
  Administered 2023-02-11: 120 mg via INTRAVENOUS
  Filled 2023-02-11: qty 10

## 2023-02-11 MED ORDER — FUROSEMIDE 10 MG/ML IJ SOLN
20.0000 mg/h | INTRAVENOUS | Status: DC
  Administered 2023-02-11 – 2023-02-14 (×9): 20 mg/h via INTRAVENOUS
  Filled 2023-02-11 (×8): qty 20

## 2023-02-11 MED ORDER — POTASSIUM CHLORIDE CRYS ER 20 MEQ PO TBCR
20.0000 meq | EXTENDED_RELEASE_TABLET | Freq: Once | ORAL | Status: AC
  Administered 2023-02-11: 20 meq via ORAL
  Filled 2023-02-11: qty 1

## 2023-02-11 MED ORDER — LIVING BETTER WITH HEART FAILURE BOOK: Freq: Once | Status: AC

## 2023-02-11 MED ORDER — METOLAZONE 5 MG PO TABS
5.0000 mg | ORAL_TABLET | Freq: Once | ORAL | Status: AC
  Administered 2023-02-11: 5 mg via ORAL
  Filled 2023-02-11: qty 1

## 2023-02-11 NOTE — Progress Notes (Addendum)
 PHARMACY - ANTICOAGULATION CONSULT NOTE  Pharmacy Consult for heparin  Indication: DVT  No Known Allergies  Patient Measurements: Height: 5' 1 (154.9 cm) Weight: 110.4 kg (243 lb 4.8 oz) IBW/kg (Calculated) : 47.8 Heparin  Dosing Weight: 85kg  Vital Signs: Temp: 97.7 F (36.5 C) (02/07 1553) Temp Source: Oral (02/07 1553) BP: 137/82 (02/07 1553) Pulse Rate: 84 (02/07 1430)  Labs: Recent Labs    02/09/23 0428 02/09/23 1611 02/10/23 0426 02/10/23 1409 02/11/23 0400 02/11/23 1652  HGB 8.9*  --  8.9*  --  8.0*  --   HCT 25.9*  --  26.1*  --  23.6*  --   PLT 208  --  207  --  194  --   HEPARINUNFRC 0.19*   < > 0.24* 0.70 0.69 0.77*  CREATININE 2.21*  --  2.37*  --  2.30* 2.43*   < > = values in this interval not displayed.    Estimated Creatinine Clearance: 29.7 mL/min (A) (by C-G formula based on SCr of 2.43 mg/dL (H)).  Assessment: 58 yo female admitted for CHF and doppler positive for DVT.  VQ shows perfusion defects both lower lungs consistent with presence of PE.  Pharmacy dosing heparin .   Heparin  level supra-therapeutic at 0.77 units/mL.  Patient is bleeding from PICC, so heparin  was held per MD.  Goal of Therapy:  Heparin  level 0.3-0.7 units/ml Monitor platelets by anticoagulation protocol: Yes   Plan:  At 2300, resume IV heparin  per MD Resume at a reduced rate of 1750 units/hr Check 8 hr heparin  level post resumption Daily heparin  level and CBC Monitor for bleeding resolution  Jizel Cheeks D. Lendell, PharmD, BCPS, BCCCP 02/11/2023, 6:26 PM

## 2023-02-11 NOTE — Progress Notes (Signed)
 Pt had 1 urine occurrence, unable to measure due to being mixed in with bowel movement.  Sonjia Durie, RN

## 2023-02-11 NOTE — Progress Notes (Signed)
  Progress Note   Patient: [name] FMW:[id number] DOB: 1965/05/21 DOA: 02/04/2023     6 DOS: the patient was seen and examined on 02/11/2023   Brief hospital course:  Patient is a 58 year old female with ITP, diabetes mellitus type 2, CKD stage IIIa, chronic HFpEF, hypertension, CVA, ICH, PVD, right BKA, neurogenic bladder/chronic Foley presented to ED with bilateral lower extremity edema.  Patient reported 1 week of progressively worsening lower extremity edema up to her thighs.  Reported that it was getting difficult for her to get up from the wheelchair as her legs felt tight.  She had been taking Lasix  40 mg twice daily at home but not having much urine output.  Denied any shortness of breath or cough.  She also had a brief episode of left-sided chest pain.   In ED, hemoglobin 9.8, sodium 131, K5.5, bicarb 14, glucose 100, creatinine 2.7, calcium  6.4, mag 2.4, BNP 1180 Chest x-ray showed cardiomegaly with central congestion, pleural effusion. Received IV Lasix  40 mg and IV calcium  gluconate 1 g.   Troponin 226 -  216  Assessment and Plan: Acute on chronic HFpEF - IV lasix  drip - IV milrinone  drip - Nephro is following   AKI on CKD 3a  - Calcitriol  0.25 mcg PO daily - Sodium bicarb 650 mg PO tid    Hypocalcemia  - Calcium  carbonate PO tid  - Vit D 50,000 units PO every 7 days   Acute RLE DVT & PE w/ severe pulmonary HTN  - IV heparin  drip   Diarrhea  - IV ceftriaxone  2 g daily  - IV flagyl  500 mg q12   Hypervolemic hypoNa+ - Monitor   HTN - Imdur  45 mg PO daily   DM - Gabapentin  400 mg PO bid   Obesity class II - Complicating care       Subjective: Pt seen and examined at the bedside. She was started on IV lasix  drip today. She continues on IV milrinone .  Appreciate nephro following along.    Physical Exam: Vitals:   02/11/23 0306 02/11/23 0341 02/11/23 0358 02/11/23 0820  BP: (!) 150/51 (!) 125/47  (!) 129/43  Pulse: 83 81    Resp: 19 20  19   Temp:  98.4 F (36.9 C) 99 F (37.2 C)  99 F (37.2 C)  TempSrc: Oral Oral  Oral  SpO2: 98% 92%  100%  Weight: 102.6 kg  102.7 kg   Height:       Physical Exam HENT:     Head: Atraumatic.     Mouth/Throat:     Mouth: Mucous membranes are moist.  Cardiovascular:     Rate and Rhythm: Normal rate and regular rhythm.  Pulmonary:     Effort: Pulmonary effort is normal.  Abdominal:     Palpations: Abdomen is soft.  Musculoskeletal:        General: Normal range of motion.  Skin:    General: Skin is warm.  Neurological:     Mental Status: She is alert. Mental status is at baseline.  Psychiatric:        Mood and Affect: Mood normal.      Disposition: Status is: Inpatient Remains inpatient appropriate because: IV lasix  drip and milrinone  drip   Planned Discharge Destination: Skilled nursing facility    Time spent: 35 minutes  Author: ATLEE ABERNETHY , MD 02/11/2023 1:23 PM  For on call review [email or web address].

## 2023-02-11 NOTE — Progress Notes (Addendum)
    Advanced Heart Failure Rounding Note  PCP-Cardiologist: Soyla DELENA Merck, MD   Subjective:     Poor urine output yesterday, though some not documented. Started lasix  IV gtt today.    Objective:   Weight Range: 110.4 kg Body mass index is 45.97 kg/m.   Vital Signs:   Temp:  [97.7 F (36.5 C)-99.6 F (37.6 C)] 97.7 F (36.5 C) (02/07 1553) Pulse Rate:  [81-85] 84 (02/07 1430) Resp:  [15-20] 15 (02/07 1553) BP: (125-154)/(43-82) 137/82 (02/07 1553) SpO2:  [81 %-100 %] 81 % (02/07 1430) Weight:  [102.6 kg-110.4 kg] 110.4 kg (02/07 1553) Last BM Date : 02/11/23  Weight change: Filed Weights   02/11/23 0306 02/11/23 0358 02/11/23 1553  Weight: 102.6 kg 102.7 kg 110.4 kg    Intake/Output:   Intake/Output Summary (Last 24 hours) at 02/11/2023 1610 Last data filed at 02/11/2023 1524 Gross per 24 hour  Intake 2300.51 ml  Output 860 ml  Net 1440.51 ml      Physical Exam    General: chronically ill Lungs: Clear to auscultation bilaterally with normal respiratory effort. CV: Heart regular S1/S2, no murmur.  2+ peripheral edema. JVP moderately elevated Abdomen: Soft, no distention.  Neurologic: awake/alert, no gross FND.      Patient Profile   [name] is a 58 y.o. female with a hx of ITP s/p splenectomy 2022, DM2, hypertension, anemia, BKA, HFpEF, CVA, ICH, PVD, CKD 4, neurogenic bladder, thyroid  nodules, poor mobility, and poor compliance who is seen for evaluation of cor pulmonale.   Assessment/Plan   Pulmonary embolism/DVT Cor pulmonale - evidence of RV strain on echo 2/1 in the setting of active PE - R popliteal DVT as well - Poor response to IV lasix  yesterday, drip started and augment with milrinone  - Continued heparin  gtt - PICC line for CVP monitoring/milrinone , coox indicates adequate Fick output on milrinone  - Continue milrinone  0.25 mcg/kg/min to assist with RV strain and diuresis - Consider addition of tolvaptan tomorrow - No RHC with  recent PE   HFpEF - History of poor compliance with diuresis - Echo 6/22: EF 60-65, RV nl - Repeat Echo EF 55-60%, G1DD, evidence of RV overload, RA severely dilated, mod to sev TR - diuresis as above - GDMT limited by renal function   AKI on CKD 3 vs. 4 - baseline unknown. Last Cr 1.4 in 2022 - component of cardio-renal in the setting of RV failure - renal US  unremarkable - Cr 2.43 today - milrinone  + diuresis as above   HTN - normotensive on current regimen - stop imdur  to improve renal perfusion pressure - CTM with starting milrinone     Elevated troponin - hs-trop 226>216 likely demand ischemia - presented with atypical CP (1 time episode of chest pain that self resolved and lasted a few seconds). CP likely related to PE - EKG without changes in ST segment   Hypocalcemia - calcium  replacement per primary   Hypervolemia Hypernatremia - Na 126 - diuresis as above   Length of Stay: 6  Morene JINNY Brownie, MD  02/11/2023, 4:10 PM  Advanced Heart Failure Team Pager [phone] (M-F; 7a - 5p)  Please contact [facility] for night-coverage after hours (5p -7a ) and weekends on [email or web address]

## 2023-02-11 NOTE — Progress Notes (Signed)
 Notified provider of continued bleeding at PICC insertion despite dressing change. Provider ordered pressure dressing be applied. Will apply and continue to monitor.

## 2023-02-11 NOTE — NC FL2 (Signed)
 [place]  MEDICAID FL2 LEVEL OF CARE FORM     IDENTIFICATION  Patient Name: [name] Birthdate: [birth year] Sex: female Admission Date (Current Location): 02/04/2023  [facility] and [place] Number:  Producer, Television/film/video and Address:  [facility] [place], [address], [place], [place] [address]      Provider Number: [id number]  Attending Physician Name and Address:  Luz Skelton, MD  Relative Name and Phone Number:  [name], [name] [phone]    Current Level of Care: Hospital Recommended Level of Care: Skilled Nursing Facility Prior Approval Number:    Date Approved/Denied:   PASRR Number: [id number]  Discharge Plan: SNF    Current Diagnoses: Patient Active Problem List   Diagnosis Date Noted   Pulmonary hypertension, unspecified (HCC) 02/10/2023   Pulmonary embolus (HCC) 02/10/2023   Right ventricular failure (HCC) 02/10/2023   Pressure injury of skin 02/10/2023   Acute deep vein thrombosis (DVT) of popliteal vein of right lower extremity (HCC) 02/08/2023   Acute on chronic heart failure with preserved ejection fraction (HFpEF) (HCC) 02/05/2023   Metabolic acidosis 02/05/2023   Low back pain 10/24/2020   Colon abnormality--cecal wall thickening 10/24/2020   Hyponatremia    Thrombocytosis    Lobar pneumonia, unspecified organism (HCC)    Bacteremia    Abnormal CT scan, colon    Iron  deficiency anemia    Pancytopenia (HCC) 09/12/2020   Chronic respiratory failure with hypoxia (HCC)    Chronic ITP (idiopathic thrombocytopenia) (HCC) 03/08/2020   Acute on chronic respiratory failure with hypoxia (HCC) 03/08/2020   Cellulitis of foot 08/02/2019   Nausea and vomiting 06/26/2019   Hematuria 06/26/2019   Hypertensive urgency 06/26/2019   Acute on chronic diastolic CHF (congestive heart failure) (HCC)    CHF (congestive heart failure) (HCC) 04/11/2019   Acute exacerbation of CHF (congestive heart failure) (HCC) 04/11/2019   Leukopenia  04/11/2019   History of ITP 04/11/2019   Idiopathic thrombocytopenic purpura (ITP) (HCC) 11/16/2018   Onychomycosis of toenail 10/16/2018   Cataract of both eyes 10/16/2018   Tinea pedis of left foot 10/16/2018   Decreased vision in both eyes 10/16/2018   CKD (chronic kidney disease) stage 3, GFR 30-59 ml/min (HCC)    Cellulitis of left lower extremity 01/25/2018   Hyperkalemia 01/25/2018   Poor compliance 03/08/2017   Hypoxia 03/07/2017   Thyroid  nodule 03/07/2017   Chronic indwelling Foley catheter 03/04/2017   Type II diabetes mellitus (HCC)    Femur fracture (HCC) 03/02/2017   Orthostatic dizziness    Candida UTI    Enterococcus faecalis infection    Near syncope 11/25/2015   Dehydration 11/25/2015   Orthostatic hypotension 11/25/2015   UTI (urinary tract infection) 11/25/2015   Acute worsening of stage 3 chronic kidney disease (HCC) 09/24/2015   Diabetes mellitus due to underlying condition with chronic kidney disease, without long-term current use of insulin  (HCC)    Fever    Status post below knee amputation of right lower extremity (HCC)    Diabetic peripheral neuropathy (HCC)    Neuropathic pain    Benign essential HTN    Folliculitis    Slow transit constipation    Anemia of chronic disease    Bilateral hydronephrosis    Urinary retention    CKD (chronic kidney disease)    Uncontrolled type 2 diabetes mellitus with diabetic nephropathy, without long-term current use of insulin     Vasculopathy    Debility 09/04/2015   DM type 2 with diabetic peripheral  neuropathy (HCC)    S/P BKA (below knee amputation) unilateral (HCC)    Medical non-compliance    Benign skin lesion of multiple sites    Tachypnea    Acute blood loss anemia    Neurogenic bladder    Occult blood positive stool    Rectovaginal fistula    Controlled diabetes mellitus type 2 with complications (HCC)    AKI (acute kidney injury) (HCC)    Hydronephrosis determined by ultrasound    Papular rash     Uncontrolled type 2 diabetes mellitus with complication    Paresthesia    Thrombocytopenia (HCC) 08/23/2015   Pressure ulcer 08/23/2015   Severe anemia 08/23/2015   Lower urinary tract infectious disease 08/23/2015   Symptomatic anemia 08/23/2015   Peripheral vascular disease, unspecified (HCC) 04/27/2012   Unilateral complete BKA (HCC) 07/09/2011   Gas gangrene (HCC) 07/02/2011   Sepsis (HCC) 07/02/2011   Acute kidney injury superimposed on chronic kidney disease (HCC) 07/02/2011   Diabetes mellitus (HCC) 11/22/2010   Diabetic foot ulcer associated with type 2 diabetes mellitus (HCC)    HTN (hypertension)    Arthritis    Neuropathy (HCC)    Intracerebral hemorrhage (HCC)     Orientation RESPIRATION BLADDER Height & Weight     Self, Time, Situation, Place  Normal Continent Weight: 226 lb 6.6 oz (102.7 kg) Height:  5' 7 (170.2 cm)  BEHAVIORAL SYMPTOMS/MOOD NEUROLOGICAL BOWEL NUTRITION STATUS      Incontinent Diet (See dc summary)  AMBULATORY STATUS COMMUNICATION OF NEEDS Skin   Limited Assist Verbally PU Stage and Appropriate Care (Wound / Incision 02/08/23 Skin tear Elbow Posterior;Right skin tear, right posterior elbow, loss of epidermis only    Continue    Assessment;)                       Personal Care Assistance Level of Assistance  Bathing, Feeding, Total care, Dressing Bathing Assistance: Limited assistance Feeding assistance: Limited assistance Dressing Assistance: Limited assistance Total Care Assistance: Limited assistance   Functional Limitations Info  Sight, Hearing, Speech Sight Info: Impaired Hearing Info: Adequate Speech Info: Adequate    SPECIAL CARE FACTORS FREQUENCY  PT (By licensed PT), OT (By licensed OT)     PT Frequency: 5x weekly OT Frequency: 5x weekly            Contractures      Additional Factors Info  Code Status, Allergies Code Status Info: Full Allergies Info: No known allergies           Current Medications  (02/11/2023):  This is the current hospital active medication list Current Facility-Administered Medications  Medication Dose Route Frequency Provider Last Rate Last Admin   acetaminophen  (TYLENOL ) tablet 650 mg  650 mg Oral Q6H PRN Rathore, Vasundhra, MD   650 mg at 02/08/23 2307   Or   acetaminophen  (TYLENOL ) suppository 650 mg  650 mg Rectal Q6H PRN Alfornia Madison, MD       calcitRIOL  (ROCALTROL ) capsule 0.25 mcg  0.25 mcg Oral Daily Tobie Gordy POUR, MD   0.25 mcg at 02/11/23 0947   calcium  carbonate (OS-CAL - dosed in mg of elemental calcium ) tablet 2,500 mg  1,000 mg of elemental calcium  Oral TID WC Patel, Jay K, MD   2,500 mg at 02/11/23 1126   cefTRIAXone  (ROCEPHIN ) 2 g in sodium chloride  0.9 % 100 mL IVPB  2 g Intravenous Q24H Rai, Ripudeep K, MD 200 mL/hr at 02/11/23 0941 2 g at 02/11/23  9058   Chlorhexidine  Gluconate Cloth 2 % PADS 6 each  6 each Topical Daily Rai, Ripudeep K, MD   6 each at 02/11/23 0947   furosemide  (LASIX ) 200 mg in dextrose  5 % 100 mL (2 mg/mL) infusion  20 mg/hr Intravenous Continuous Zenaida Morene PARAS, MD 10 mL/hr at 02/11/23 1114 20 mg/hr at 02/11/23 1114   gabapentin  (NEURONTIN ) capsule 400 mg  400 mg Oral BID Rathore, Vasundhra, MD   400 mg at 02/11/23 9053   Gerhardt's butt cream   Topical TID Davia Nydia POUR, MD   Given at 02/11/23 0946   guaiFENesin -dextromethorphan  (ROBITUSSIN DM) 100-10 MG/5ML syrup 5 mL  5 mL Oral Q4H PRN Rai, Ripudeep K, MD   5 mL at 02/11/23 0014   heparin  ADULT infusion 100 units/mL (25000 units/250mL)  1,850 Units/hr Intravenous Continuous Sira, Zackery, MD 18.5 mL/hr at 02/11/23 1126 1,850 Units/hr at 02/11/23 1126   influenza vac split trivalent PF (FLULAVAL) injection 0.5 mL  0.5 mL Intramuscular Tomorrow-1000 Rai, Ripudeep K, MD       isosorbide  mononitrate (IMDUR ) 24 hr tablet 45 mg  45 mg Oral Daily Rathore, Vasundhra, MD   45 mg at 02/11/23 0946   metroNIDAZOLE  (FLAGYL ) IVPB 500 mg  500 mg Intravenous Q12H Rai, Ripudeep K, MD 100  mL/hr at 02/11/23 0816 500 mg at 02/11/23 0816   milrinone  (PRIMACOR ) 20 MG/100 ML (0.2 mg/mL) infusion  0.25 mcg/kg/min Intravenous Continuous Lee, Jordan, NP 7.93 mL/hr at 02/11/23 0549 0.25 mcg/kg/min at 02/11/23 0549   oxyCODONE  (Oxy IR/ROXICODONE ) immediate release tablet 5 mg  5 mg Oral Q4H PRN Rai, Ripudeep K, MD   5 mg at 02/11/23 0527   potassium chloride  SA (KLOR-CON  M) CR tablet 40 mEq  40 mEq Oral Q4H Stoner, Benjamin J, MD   40 mEq at 02/11/23 9053   sodium bicarbonate  tablet 650 mg  650 mg Oral TID Rai, Nydia POUR, MD   650 mg at 02/11/23 0946   sodium chloride  flush (NS) 0.9 % injection 10-40 mL  10-40 mL Intracatheter Q12H Rai, Ripudeep K, MD   30 mL at 02/11/23 0947   sodium chloride  flush (NS) 0.9 % injection 10-40 mL  10-40 mL Intracatheter PRN Rai, Ripudeep K, MD       Vitamin D  (Ergocalciferol ) (DRISDOL ) 1.25 MG (50000 UNIT) capsule 50,000 Units  50,000 Units Oral Q7 days Davia Nydia POUR, MD   50,000 Units at 02/08/23 1817     Discharge Medications: Please see discharge summary for a list of discharge medications.  Relevant Imaging Results:  Relevant Lab Results:   Additional Information [id number]  Arlana PARAS Moats, LCSWA

## 2023-02-11 NOTE — Progress Notes (Signed)
 Physical Therapy Treatment Patient Details Name: [name] MRN: [id number] DOB: 13-Sep-1965 Today's Date: 02/11/2023   History of Present Illness Pt is a 58 y.o. female who presented 02/05/23 with bil lower extremity edema. Admitted with acute on chronic diastolic heart failure. Imaging 2/5 showed PE. Pt also with DVT. Heparin  initiated 2/3. PMH: ITP, DM2, CKD stage IIIa, chronic HFpEF, HTN, CVA, ICH, PVD, right BKA, neurogenic bladder/chronic Foley    PT Comments  Focused session on strengthening pt's L leg and bil upper extremities through repeated push ups from EOB and from recliner to improve buttocks clearance for transfers. Pt benefited from verbal and tactile cues along with visual demonstration to gain momentum via rocking trunk and bringing nose over toes to improve buttocks clearance. She required minA to laterally scoot but modA to squat pivot bed <> recliner today. Of note, a little bleeding was noted at her PICC line insertion today, but it appeared stable in quantity (observed for > 10 min), RN notified. Will continue to follow acutely.    If plan is discharge home, recommend the following: A little help with bathing/dressing/bathroom;Assistance with cooking/housework;Direct supervision/assist for financial management;Direct supervision/assist for medications management;Assist for transportation;Help with stairs or ramp for entrance;Supervision due to cognitive status;A lot of help with walking and/or transfers   Can travel by private vehicle     No  Equipment Recommendations  None recommended by PT    Recommendations for Other Services       Precautions / Restrictions Precautions Precautions: Fall Precaution Comments: R BKA (prosthesis not present) Restrictions Weight Bearing Restrictions Per Provider Order: No     Mobility  Bed Mobility Overal bed mobility: Needs Assistance Bed Mobility: Supine to Sit, Sit to Supine, Rolling Rolling: Supervision, Used rails    Supine to sit: Supervision, HOB elevated, Used rails Sit to supine: Supervision   General bed mobility comments: Extra time and use of rails to pull up and pivot to sit L EOB, HOB elevated, supervision for safety. Pt used rails to roll bil for linen change and pericare. Able to return herself to supine without assistance, supervision for safety and line management    Transfers Overall transfer level: Needs assistance Equipment used: None Transfers: Bed to chair/wheelchair/BSC       Squat pivot transfers: Mod assist    Lateral/Scoot Transfers: Min assist General transfer comment: Verbal cues, tactile cues, and visual demonstration provided to get nose over toes and rock trunk to gain momentum to increase ease with unweighting buttocks for transfers. Then educated pt on pivoting hips once unweighted. fair success noted, but pt's weakness impacts her ease and independence with clearing her buttocks fully, needing minA to clear buttocks, minA to laterally scoot, and modA to pivot hips in squat pivot. Pt transferred to L from bed to recliner 1x and to R from recliner to EOB 1x    Ambulation/Gait               General Gait Details: unable at baseline   Stairs             Wheelchair Mobility     Tilt Bed    Modified Rankin (Stroke Patients Only)       Balance Overall balance assessment: Needs assistance Sitting-balance support: No upper extremity supported, Feet supported Sitting balance-Leahy Scale: Fair     Standing balance support: Bilateral upper extremity supported Standing balance-Leahy Scale: Poor Standing balance comment: Reliant on UE support and external physical assistance to clear buttocks from sitting surface,  did not attempt full stand today                            Cognition Arousal: Alert, Lethargic Behavior During Therapy: WFL for tasks assessed/performed, Anxious Overall Cognitive Status: Impaired/Different from baseline Area  of Impairment: Following commands, Problem solving, Memory, Safety/judgement                     Memory: Decreased short-term memory Following Commands: Follows one step commands consistently, Follows one step commands with increased time, Follows multi-step commands inconsistently Safety/Judgement: Decreased awareness of deficits   Problem Solving: Slow processing, Difficulty sequencing, Requires verbal cues, Requires tactile cues General Comments: Pt a little lethargic today, impacting attention. Slow to process cues and needs repetition of cues intermittently.        Exercises Other Exercises Other Exercises: Chair push ups x2, EOB push ups >4x with intermittent minA    General Comments General comments (skin integrity, edema, etc.): encouraged compliance with HEP      Pertinent Vitals/Pain Pain Assessment Pain Assessment: Faces Faces Pain Scale: Hurts little more Pain Location: buttocks Pain Descriptors / Indicators: Discomfort, Grimacing Pain Intervention(s): Monitored during session, Limited activity within patient's tolerance, Repositioned    Home Living                          Prior Function            PT Goals (current goals can now be found in the care plan section) Acute Rehab PT Goals Patient Stated Goal: to get stronger PT Goal Formulation: With patient Time For Goal Achievement: 02/20/23 Potential to Achieve Goals: Good Progress towards PT goals: Progressing toward goals    Frequency    Min 1X/week      PT Plan      Co-evaluation              AM-PAC PT 6 Clicks Mobility   Outcome Measure  Help needed turning from your back to your side while in a flat bed without using bedrails?: A Little Help needed moving from lying on your back to sitting on the side of a flat bed without using bedrails?: A Little Help needed moving to and from a bed to a chair (including a wheelchair)?: A Lot Help needed standing up from a chair  using your arms (e.g., wheelchair or bedside chair)?: A Lot Help needed to walk in hospital room?: Total Help needed climbing 3-5 steps with a railing? : Total 6 Click Score: 12    End of Session Equipment Utilized During Treatment: Gait belt Activity Tolerance: Patient tolerated treatment well Patient left: with call bell/phone within reach;in bed;with bed alarm set Nurse Communication: Other (comment) (noted stable amount of blood from PICC) PT Visit Diagnosis: Unsteadiness on feet (R26.81);Muscle weakness (generalized) (M62.81);Difficulty in walking, not elsewhere classified (R26.2)     Time: 8451-8376 PT Time Calculation (min) (ACUTE ONLY): 35 min  Charges:    $Therapeutic Exercise: 8-22 mins $Therapeutic Activity: 8-22 mins PT General Charges $$ ACUTE PT VISIT: 1 Visit                     Theo Ferretti, PT, DPT Acute Rehabilitation Services  Office: [phone]    Theo CHRISTELLA Ferretti 02/11/2023, 4:57 PM

## 2023-02-11 NOTE — Progress Notes (Signed)
 PHARMACY - ANTICOAGULATION CONSULT NOTE  Pharmacy Consult for heparin  Indication: DVT  No Known Allergies  Patient Measurements: Height: 5' 7 (170.2 cm) Weight: 102.7 kg (226 lb 6.6 oz) IBW/kg (Calculated) : 61.6 Heparin  Dosing Weight: 85kg  Vital Signs: Temp: 99 F (37.2 C) (02/07 0820) Temp Source: Oral (02/07 0820) BP: 129/43 (02/07 0820) Pulse Rate: 81 (02/07 0341)  Labs: Recent Labs    02/09/23 0428 02/09/23 1611 02/10/23 0426 02/10/23 1409 02/11/23 0400  HGB 8.9*  --  8.9*  --  8.0*  HCT 25.9*  --  26.1*  --  23.6*  PLT 208  --  207  --  194  HEPARINUNFRC 0.19*   < > 0.24* 0.70 0.69  CREATININE 2.21*  --  2.37*  --  2.30*   < > = values in this interval not displayed.    Estimated Creatinine Clearance: 33.6 mL/min (A) (by C-G formula based on SCr of 2.3 mg/dL (H)).  Assessment: 58 yo female admitted for CHF and doppler positive for DVT.  VQ shows perfusion defects both lower lungs consistent with presence of PE.  Pharmacy dosing heparin .   -Heparin  level 0.69, upper end of therapeutic range -Hgb 8, pltc 194 -No issues per RN  Goal of Therapy:  Heparin  level 0.3-0.7 units/ml Monitor platelets by anticoagulation protocol: Yes   Plan:  Decrease heparin  infusion slightly to 1850 units/hr to avoid overshooting 6 hour anti-Xa level Daily anti-Xa level and CBC F/u with oral AC  Maurilio Fila, PharmD Clinical Pharmacist 02/11/2023  8:47 AM

## 2023-02-11 NOTE — Progress Notes (Signed)
 Patient ID: [name], female   DOB: [birth year], 58 y.o.   MRN: [id number] [facility] Progress Note   Assessment/ Plan:   1.  Acute kidney injury on chronic kidney disease stage III: She likely has had progression of her underlying chronic kidney disease at baseline, at this time with superimposed acute kidney injury that is cardiorenal in mechanism.  BUN slightly elevated this morning with static creatinine and unimpressive response to furosemide /milrinone .  Will add a single dose of metolazone  today with transition to furosemide  drip.  Potassium replaced. 2.  Hypocalcemia: Refractory to earlier efforts at medical management.  PTH level elevated with severe vitamin D  deficiency; continue calcitriol  and ergocalciferol  for treatment respectively. 3.  Acute exacerbation of congestive heart failure: Unimpressive urine output/volume unloading with bolus dose of furosemide  and milrinone ; started today on furosemide  drip and will add a single dose of metolazone  4.  Hyponatremia: Appears most consistent with inappropriate ADH state in the setting of CHF exacerbation.  Monitor with diuresis and limit free water  intake.  Subjective:   No acute events overnight, she reports that leg swelling is unchanged since yesterday and frequency of urinating is lower (twice a day).   Objective:   BP (!) 129/43   Pulse 81   Temp 99 F (37.2 C) (Oral)   Resp 20   Ht 5' 7 (1.702 m)   Wt 102.7 kg   LMP  (LMP Unknown)   SpO2 100%   BMI 35.46 kg/m   Intake/Output Summary (Last 24 hours) at 02/11/2023 1053 Last data filed at 02/11/2023 0932 Gross per 24 hour  Intake 1354.2 ml  Output 260 ml  Net 1094.2 ml   Weight change: -3.134 kg  Physical Exam: Gen: Comfortably sitting up in bed eating breakfast CVS: Pulse regular rhythm, normal rate, S1 and S2 normal Resp: Clear to auscultation bilaterally without rales/rhonchi Abd: Soft, obese, nontender Ext: 1+ right BKA stump edema with 2+ left  leg edema extending to just above the knee  Imaging: US  EKG SITE RITE Result Date: 02/10/2023 If Site Rite image not attached, placement could not be confirmed due to current cardiac rhythm.  NM Pulmonary Perfusion Result Date: 02/09/2023 CLINICAL DATA:  Pulmonary hypertension suspected.  Leg swelling EXAM: NUCLEAR MEDICINE PERFUSION LUNG SCAN TECHNIQUE: Perfusion images were obtained in multiple projections after intravenous injection of radiopharmaceutical. Ventilation scans intentionally deferred if perfusion scan and chest x-ray adequate for interpretation during COVID 19 epidemic. RADIOPHARMACEUTICALS:  4.3 mCi Tc-21m MAA IV COMPARISON:  Chest radiograph 02/09/2023 FINDINGS: Wedge-shaped perfusion defects are demonstrated in the superior and posterior basal segments of the right lower lung and posterior basal segment of the left lower lung. IMPRESSION: Perfusion defects are demonstrated in both lower lungs consistent with presence of pulmonary embolus by PISAPED criteria. Electronically Signed   By: Elsie Gravely M.D.   On: 02/09/2023 19:09   DG CHEST PORT 1 VIEW Result Date: 02/09/2023 CLINICAL DATA:  Pulmonary hypertension.  Lower extremity swelling. EXAM: PORTABLE CHEST 1 VIEW COMPARISON:  Chest radiograph dated 02/04/2023. FINDINGS: There is cardiomegaly with mild central vascular congestion. No focal consolidation, pleural effusion, pneumothorax. No acute osseous pathology. IMPRESSION: Cardiomegaly with mild central vascular congestion. Electronically Signed   By: Vanetta Chou M.D.   On: 02/09/2023 16:22    Labs: BMET Recent Labs  Lab 02/05/23 0724 02/06/23 0543 02/07/23 0405 02/08/23 0447 02/09/23 0428 02/10/23 0426 02/11/23 0400  NA 132* 133* 132* 132* 128* 129* 126*  K 5.4* 4.2 4.1 3.9 4.0  4.0 3.4*  CL 107 106 107 101 99 101 98  CO2 12* 13* 16* 16* 15* 15* 15*  GLUCOSE 90 100* 132* 126* 98 113* 193*  BUN 52* 55* 56* 59* 63* 68* 70*  CREATININE 2.88* 2.52* 2.23* 2.26*  2.21* 2.37* 2.30*  CALCIUM  6.7* 6.5* 6.2* 6.4* 6.6* 6.5* 6.7*   CBC Recent Labs  Lab 02/08/23 0447 02/09/23 0428 02/10/23 0426 02/11/23 0400  WBC 8.2 7.7 8.2 8.8  HGB 9.1* 8.9* 8.9* 8.0*  HCT 26.4* 25.9* 26.1* 23.6*  MCV 90.4 89.9 90.0 90.4  PLT 214 208 207 194    Medications:     calcitRIOL   0.25 mcg Oral Daily   calcium  carbonate  1,000 mg of elemental calcium  Oral TID WC   Chlorhexidine  Gluconate Cloth  6 each Topical Daily   gabapentin   400 mg Oral BID   Gerhardt's butt cream   Topical TID   influenza vac split trivalent PF  0.5 mL Intramuscular Tomorrow-1000   isosorbide  mononitrate  45 mg Oral Daily   potassium chloride   40 mEq Oral Q4H   sodium bicarbonate   650 mg Oral TID   sodium chloride  flush  10-40 mL Intracatheter Q12H   Vitamin D  (Ergocalciferol )  50,000 Units Oral Q7 days    Gordy Blanch, MD 02/11/2023, 10:53 AM

## 2023-02-11 NOTE — Progress Notes (Signed)
 Notified of bleeding at PICC site by PT/OT. Upon evaluation patient having moderate bleeding at insertion site. Dr. Carlette Cheers notified and gave order to resume heparin  in 6 hours. Heparin  paused at 1659. To resume at 2300.

## 2023-02-11 NOTE — Plan of Care (Signed)
  Problem: Education: Goal: Knowledge of General Education information will improve Description: Including pain rating scale, medication(s)/side effects and non-pharmacologic comfort measures Outcome: Progressing   Problem: Health Behavior/Discharge Planning: Goal: Ability to manage health-related needs will improve Outcome: Progressing   Problem: Clinical Measurements: Goal: Ability to maintain clinical measurements within normal limits will improve Outcome: Progressing Goal: Will remain free from infection Outcome: Progressing Goal: Diagnostic test results will improve Outcome: Progressing Goal: Respiratory complications will improve Outcome: Progressing Goal: Cardiovascular complication will be avoided Outcome: Progressing   Problem: Activity: Goal: Risk for activity intolerance will decrease Outcome: Progressing   Problem: Nutrition: Goal: Adequate nutrition will be maintained Outcome: Progressing   Problem: Coping: Goal: Level of anxiety will decrease Outcome: Progressing   Problem: Elimination: Goal: Will not experience complications related to bowel motility Outcome: Progressing Goal: Will not experience complications related to urinary retention Outcome: Progressing   Problem: Pain Managment: Goal: General experience of comfort will improve and/or be controlled Outcome: Progressing   Problem: Safety: Goal: Ability to remain free from injury will improve Outcome: Progressing   Problem: Skin Integrity: Goal: Risk for impaired skin integrity will decrease Outcome: Progressing   Problem: Education: Goal: Ability to demonstrate management of disease process will improve Outcome: Progressing Goal: Ability to verbalize understanding of medication therapies will improve Outcome: Progressing Goal: Individualized Educational Video(s) Outcome: Progressing   Problem: Activity: Goal: Capacity to carry out activities will improve Outcome: Progressing    Problem: Cardiac: Goal: Ability to achieve and maintain adequate cardiopulmonary perfusion will improve Outcome: Progressing   Problem: Consults Goal: Venous Thromboembolism Patient Education Description: See Patient Education Module for education specifics. Outcome: Progressing Goal: Diagnosis - Venous Thromboembolism (VTE) Description: Choose a selection Outcome: Progressing Goal: Pharmacy Consult for anticoagulation Outcome: Progressing Goal: Skin Care Protocol Initiated - if Braden Score 18 or less Description: If consults are not indicated, leave blank or document N/A Outcome: Progressing Goal: Nutrition Consult-if indicated Outcome: Progressing Goal: Diabetes Guidelines if Diabetic/Glucose > 140 Description: If diabetic or lab glucose is > 140 mg/dl - Initiate Diabetes/Hyperglycemia Guidelines & Document Interventions  Outcome: Progressing   Problem: Phase I Progression Outcomes Goal: Pain controlled with appropriate interventions Outcome: Progressing Goal: Dyspnea controlled at rest (PE) Outcome: Progressing Goal: Tolerating diet Outcome: Progressing Goal: Initial discharge plan identified Outcome: Progressing Goal: Voiding-avoid urinary catheter unless indicated Outcome: Progressing Goal: Hemodynamically stable Outcome: Progressing Goal: Other Phase I Outcomes/Goals Outcome: Progressing   Problem: Phase II Progression Outcomes Goal: Therapeutic drug levels for anticoagulation Outcome: Progressing Goal: 02 sats trending upward/stable (PE) Outcome: Progressing Goal: Discharge plan established Outcome: Progressing Goal: Tolerating diet Outcome: Progressing Goal: Other Phase II Outcomes/Goals Outcome: Progressing   Problem: Phase III Progression Outcomes Goal: 02 sats stabilized Outcome: Progressing Goal: Activity at appropriate level-compared to baseline Description: (UP IN CHAIR FOR HEMODIALYSIS) Outcome: Progressing Goal: Discharge plan remains  appropriate-arrangements made Outcome: Progressing Goal: Other Phase III Outcomes/Goals Outcome: Progressing   Problem: Discharge Progression Outcomes Goal: Barriers To Progression Addressed/Resolved Outcome: Progressing Goal: Discharge plan in place and appropriate Outcome: Progressing Goal: Pain controlled with appropriate interventions Outcome: Progressing Goal: Hemodynamically stable Outcome: Progressing Goal: Complications resolved/controlled Outcome: Progressing Goal: Tolerating diet Outcome: Progressing Goal: Activity appropriate for discharge plan Outcome: Progressing Goal: Other Discharge Outcomes/Goals Outcome: Progressing

## 2023-02-12 ENCOUNTER — Inpatient Hospital Stay (HOSPITAL_COMMUNITY): Payer: Medicare Other

## 2023-02-12 DIAGNOSIS — I2609 Other pulmonary embolism with acute cor pulmonale: Secondary | ICD-10-CM | POA: Diagnosis not present

## 2023-02-12 DIAGNOSIS — I5033 Acute on chronic diastolic (congestive) heart failure: Secondary | ICD-10-CM | POA: Diagnosis not present

## 2023-02-12 LAB — CBC
HCT: 24.6 % — ABNORMAL LOW (ref 36.0–46.0)
Hemoglobin: 8.2 g/dL — ABNORMAL LOW (ref 12.0–15.0)
MCH: 30.3 pg (ref 26.0–34.0)
MCHC: 33.3 g/dL (ref 30.0–36.0)
MCV: 90.8 fL (ref 80.0–100.0)
Platelets: 216 10*3/uL (ref 150–400)
RBC: 2.71 MIL/uL — ABNORMAL LOW (ref 3.87–5.11)
RDW: 16.1 % — ABNORMAL HIGH (ref 11.5–15.5)
WBC: 9 10*3/uL (ref 4.0–10.5)
nRBC: 0 % (ref 0.0–0.2)

## 2023-02-12 LAB — BASIC METABOLIC PANEL
Anion gap: 13 (ref 5–15)
BUN: 74 mg/dL — ABNORMAL HIGH (ref 6–20)
CO2: 16 mmol/L — ABNORMAL LOW (ref 22–32)
Calcium: 7.2 mg/dL — ABNORMAL LOW (ref 8.9–10.3)
Chloride: 98 mmol/L (ref 98–111)
Creatinine, Ser: 2.45 mg/dL — ABNORMAL HIGH (ref 0.44–1.00)
GFR, Estimated: 23 mL/min — ABNORMAL LOW (ref >60)
Glucose, Bld: 94 mg/dL (ref 70–99)
Potassium: 4.2 mmol/L (ref 3.5–5.1)
Sodium: 127 mmol/L — ABNORMAL LOW (ref 135–145)

## 2023-02-12 LAB — COOXEMETRY PANEL
Carboxyhemoglobin: 1.6 % — ABNORMAL HIGH (ref 0.5–1.5)
Methemoglobin: 1.3 % (ref 0.0–1.5)
O2 Saturation: 79 %
Total hemoglobin: 8.6 g/dL — ABNORMAL LOW (ref 12.0–16.0)

## 2023-02-12 LAB — MAGNESIUM: Magnesium: 2.2 mg/dL (ref 1.7–2.4)

## 2023-02-12 LAB — HEPARIN LEVEL (UNFRACTIONATED)
Heparin Unfractionated: 0.4 [IU]/mL (ref 0.30–0.70)
Heparin Unfractionated: 0.44 [IU]/mL (ref 0.30–0.70)

## 2023-02-12 MED ORDER — METOLAZONE 5 MG PO TABS
5.0000 mg | ORAL_TABLET | Freq: Two times a day (BID) | ORAL | Status: AC
  Administered 2023-02-12 – 2023-02-13 (×3): 5 mg via ORAL
  Filled 2023-02-12 (×3): qty 1

## 2023-02-12 NOTE — Progress Notes (Signed)
 Progress Note   Patient: [name] FMW:[id number] DOB: 19-Nov-1965 DOA: 02/04/2023     7 DOS: the patient was seen and examined on 02/12/2023   Brief hospital course: Patient is a 58 year old female with ITP, diabetes mellitus type 2, CKD stage IIIa, chronic HFpEF, hypertension, CVA, ICH, PVD, right BKA, neurogenic bladder/chronic Foley presented to ED with bilateral lower extremity edema.  Patient reported 1 week of progressively worsening lower extremity edema up to her thighs.  Reported that it was getting difficult for her to get up from the wheelchair as her legs felt tight.  She had been taking Lasix  40 mg twice daily at home but not having much urine output.  Denied any shortness of breath or cough.  She also had a brief episode of left-sided chest pain.   In ED, hemoglobin 9.8, sodium 131, K5.5, bicarb 14, glucose 100, creatinine 2.7, calcium  6.4, mag 2.4, BNP 1180 Chest x-ray showed cardiomegaly with central congestion, pleural effusion. Received IV Lasix  40 mg and IV calcium  gluconate 1 g.   Troponin 226 -  216  Assessment and Plan: Acute on chronic HFpEF - IV lasix  drip - IV milrinone  drip - Metolazone  5 mg PO bid  - Nephro is following    AKI on CKD 3a  - Calcitriol  0.25 mcg PO daily - Sodium bicarb 650 mg PO tid     Hypocalcemia  - Calcium  carbonate PO tid  - Vit D 50,000 units PO every 7 days    Acute RLE DVT & PE w/ severe pulmonary HTN  - IV heparin  drip    Diarrhea  - IV ceftriaxone  2 g daily  - IV flagyl  500 mg q12    Hypervolemic hypoNa+ - Monitor    HTN - Monitor    DM - Gabapentin  400 mg PO bid    Obesity class II - Complicating care   Subjective: Pt seen and examined at the bedside. She remains on lasix  and milrinone  drip. Urine output has been unimpressive. Nephrology may consider renal replacement therapy if urine output remains poor.  Physical Exam: Vitals:   02/11/23 1553 02/11/23 2004 02/12/23 0414 02/12/23 0800  BP: 137/82 (!) 132/43  (!) 123/47 (!) 119/55  Pulse:  90 85 80  Resp: 15 16 18    Temp: 97.7 F (36.5 C) 98.5 F (36.9 C) 99.6 F (37.6 C) 98.9 F (37.2 C)  TempSrc: Oral Oral Oral Oral  SpO2:  94% 95% 96%  Weight: 110.4 kg  103.9 kg   Height: 5' 1 (1.549 m)      HENT:     Head: Atraumatic.     Mouth/Throat:     Mouth: Mucous membranes are moist.  Cardiovascular:     Rate and Rhythm: Normal rate and regular rhythm.  Pulmonary:     Effort: Pulmonary effort is normal.  Abdominal:     Palpations: Abdomen is soft.  Musculoskeletal:        General: Normal range of motion.  Skin:    General: Skin is warm.  Neurological:     Mental Status: She is alert. Mental status is at baseline.  Psychiatric:        Mood and Affect: Mood normal.     Disposition: Status is: Inpatient Remains inpatient appropriate because: IV lasix /milrinone  drips as above. Possible initiation of RRT  Planned Discharge Destination:  Dispo per pt's clinical progress    Time spent: 35 minutes  Author: Antwonette Feliz , MD 02/12/2023 11:34 AM  For on call review  [email or web address]

## 2023-02-12 NOTE — Progress Notes (Signed)
 Rounding Note    Patient Name: JOELI FENNER Date of Encounter: 02/12/2023  [facility] Cardiologist: Soyla DELENA Merck, MD   Subjective   Raniyah is a 58 year old female with a history of HFpEF, pulmonary embolus with cor pulmonale, acute on chronic kidney disease, hyponatremia.  She is being seen by nephrology.  Choloxin this morning is stable at 79. She remains hyponatremic-sodium is 127.  Creatinine is fairly stable at 2.45.  She has been followed by Dr. Zenaida of the congestive heart failure team.  She remain edematous  I/O are + 4. 2 liters ( but she admits she does not save all her urine )     Inpatient Medications    Scheduled Meds:  calcitRIOL   0.25 mcg Oral Daily   calcium  carbonate  1,000 mg of elemental calcium  Oral TID WC   Chlorhexidine  Gluconate Cloth  6 each Topical Daily   gabapentin   400 mg Oral BID   Gerhardt's butt cream   Topical TID   influenza vac split trivalent PF  0.5 mL Intramuscular Tomorrow-1000   metolazone   5 mg Oral BID   sodium bicarbonate   650 mg Oral TID   sodium chloride  flush  10-40 mL Intracatheter Q12H   Vitamin D  (Ergocalciferol )  50,000 Units Oral Q7 days   Continuous Infusions:  cefTRIAXone  (ROCEPHIN )  IV Stopped (02/12/23 1017)   furosemide  (LASIX ) 200 mg in dextrose  5 % 100 mL (2 mg/mL) infusion 20 mg/hr (02/12/23 1200)   heparin  1,750 Units/hr (02/12/23 1200)   metronidazole  Stopped (02/12/23 0928)   milrinone  0.25 mcg/kg/min (02/12/23 1200)   PRN Meds: acetaminophen  **OR** acetaminophen , guaiFENesin -dextromethorphan , oxyCODONE , sodium chloride  flush   Vital Signs    Vitals:   02/11/23 1553 02/11/23 2004 02/12/23 0414 02/12/23 0800  BP: 137/82 (!) 132/43 (!) 123/47 (!) 119/55  Pulse:  90 85 80  Resp: 15 16 18    Temp: 97.7 F (36.5 C) 98.5 F (36.9 C) 99.6 F (37.6 C) 98.9 F (37.2 C)  TempSrc: Oral Oral Oral Oral  SpO2:  94% 95% 96%  Weight: 110.4 kg  103.9 kg   Height: 5' 1 (1.549 m)        Intake/Output Summary (Last 24 hours) at 02/12/2023 1355 Last data filed at 02/12/2023 1200 Gross per 24 hour  Intake 1466.97 ml  Output 800 ml  Net 666.97 ml      02/12/2023    4:14 AM 02/11/2023    3:53 PM 02/11/2023    3:58 AM  Last 3 Weights  Weight (lbs) 229 lb 0.9 oz 243 lb 4.8 oz 226 lb 6.6 oz  Weight (kg) 103.9 kg 110.36 kg 102.7 kg      Telemetry    NSR at 90  - Personally Reviewed  ECG     - Personally Reviewed  Physical Exam   GEN: obese female , No acute distress.   Neck: No JVD Cardiac: RRR, + systolic murmur  Respiratory: Clear to auscultation bilaterally. GI: Soft, nontender, non-distended  MS:  s/p R BKA , 1-2 + edema up both legs  Neuro:  Nonfocal  Psych: Normal affect   Labs    High Sensitivity Troponin:   Recent Labs  Lab 02/05/23 0724 02/05/23 0925  TROPONINIHS 226* 216*     Chemistry Recent Labs  Lab 02/07/23 0405 02/07/23 1429 02/08/23 0447 02/09/23 0428 02/10/23 0426 02/11/23 0400 02/11/23 1652 02/12/23 0655  NA 132*  --  132*   < > 129* 126* 126* 127*  K 4.1  --  3.9   < > 4.0 3.4* 3.9 4.2  CL 107  --  101   < > 101 98 98 98  CO2 16*  --  16*   < > 15* 15* 15* 16*  GLUCOSE 132*  --  126*   < > 113* 193* 200* 94  BUN 56*  --  59*   < > 68* 70* 73* 74*  CREATININE 2.23*  --  2.26*   < > 2.37* 2.30* 2.43* 2.45*  CALCIUM  6.2*  --  6.4*   < > 6.5* 6.7* 7.0* 7.2*  MG  --    < > 1.9   < > 1.9 1.7  --  2.2  PROT 6.7  --   --   --   --   --   --   --   ALBUMIN  2.5*  --  2.7*  --   --   --   --   --   AST 25  --   --   --   --   --   --   --   ALT 15  --   --   --   --   --   --   --   ALKPHOS 169*  --   --   --   --   --   --   --   BILITOT 0.7  --   --   --   --   --   --   --   GFRNONAA 25*  --  25*   < > 23* 24* 23* 23*  ANIONGAP 9  --  15   < > 13 13 13 13    < > = values in this interval not displayed.    Lipids No results for input(s): CHOL, TRIG, HDL, LABVLDL, LDLCALC, CHOLHDL in the last 168 hours.   Hematology Recent Labs  Lab 02/09/23 0428 02/10/23 0426 02/11/23 0400  WBC 7.7 8.2 8.8  RBC 2.88* 2.90* 2.61*  HGB 8.9* 8.9* 8.0*  HCT 25.9* 26.1* 23.6*  MCV 89.9 90.0 90.4  MCH 30.9 30.7 30.7  MCHC 34.4 34.1 33.9  RDW 16.8* 16.4* 16.5*  PLT 208 207 194   Thyroid  No results for input(s): TSH, FREET4 in the last 168 hours.  BNP Recent Labs  Lab 02/08/23 0446  BNP 1,321.8*    DDimer  Recent Labs  Lab 02/07/23 1429  DDIMER 3.48*     Radiology    DG CHEST PORT 1 VIEW Result Date: 02/12/2023 CLINICAL DATA:  Central line placement. EXAM: PORTABLE CHEST 1 VIEW COMPARISON:  02/09/2023 FINDINGS: Low volume film. The cardio pericardial silhouette is enlarged. Bibasilar collapse/consolidation noted, left greater than right and stable to minimally progressive in the left base. There is pulmonary vascular congestion without overt pulmonary edema. Right PICC line tip overlies the region of the innominate vein confluence. Telemetry leads overlie the chest. IMPRESSION: 1. Right PICC line tip overlies the region of the innominate vein confluence. 2. Low volume film with bibasilar collapse/consolidation, left greater than right. Electronically Signed   By: Camellia Candle M.D.   On: 02/12/2023 07:44    Cardiac Studies     Patient Profile     58 y.o. female   Assessment & Plan    HFpEF: Continue diuresis.  She is on furosemide  drip 20 mg/h.  She is not having a lot of urine so her ins and outs are not accurate.  Will continue the Lasix  drip with  intermittent metolazone .  Nephrology is assisting. 2.  Hypertension: Currently normal.  3.  History of pulmonary embolism: She had evidence of RV strain on echo.  Continue heparin  drip.     Total time spent with examination of patient, reviewing notes, discussion with patient -30 minutes     For questions or updates, please contact [facility] Please consult [email or web address] for contact info under        Signed, Aleene Passe, MD  02/12/2023, 1:55 PM

## 2023-02-12 NOTE — Progress Notes (Signed)
 PHARMACY - ANTICOAGULATION CONSULT NOTE  Pharmacy Consult for heparin  Indication: DVT  No Known Allergies  Patient Measurements: Height: 5' 1 (154.9 cm) Weight: 103.9 kg (229 lb 0.9 oz) IBW/kg (Calculated) : 47.8 Heparin  Dosing Weight: 85kg  Vital Signs: Temp: 98.9 F (37.2 C) (02/08 0800) Temp Source: Oral (02/08 0800) BP: 119/55 (02/08 0800) Pulse Rate: 80 (02/08 0800)  Labs: Recent Labs    02/10/23 0426 02/10/23 1409 02/11/23 0400 02/11/23 1652 02/12/23 0655 02/12/23 1554  HGB 8.9*  --  8.0*  --   --  8.2*  HCT 26.1*  --  23.6*  --   --  24.6*  PLT 207  --  194  --   --  216  HEPARINUNFRC 0.24*   < > 0.69 0.77* 0.40 0.44  CREATININE 2.37*  --  2.30* 2.43* 2.45*  --    < > = values in this interval not displayed.    Estimated Creatinine Clearance: 28.4 mL/min (A) (by C-G formula based on SCr of 2.45 mg/dL (H)).  Assessment: 57 yo female admitted for CHF and doppler positive for DVT.  VQ shows perfusion defects both lower lungs consistent with presence of PE.  Pharmacy dosing heparin .   Heparin  level remains therapeutic on 150 units/hr  Goal of Therapy:  Heparin  level 0.3-0.7 units/ml Monitor platelets by anticoagulation protocol: Yes   Plan:  Continue heparin  at 1750 units/hr Daily heparin  level, CBC, s/s bleeding  Dorn Poot, PharmD, [facility] Clinical Pharmacist ED Pharmacist Phone # [phone] 02/12/2023 4:53 PM

## 2023-02-12 NOTE — Progress Notes (Signed)
 PHARMACY - ANTICOAGULATION CONSULT NOTE  Pharmacy Consult for heparin  Indication: DVT  No Known Allergies  Patient Measurements: Height: 5' 1 (154.9 cm) Weight: 103.9 kg (229 lb 0.9 oz) IBW/kg (Calculated) : 47.8 Heparin  Dosing Weight: 85kg  Vital Signs: Temp: 98.9 F (37.2 C) (02/08 0800) Temp Source: Oral (02/08 0800) BP: 119/55 (02/08 0800) Pulse Rate: 80 (02/08 0800)  Labs: Recent Labs    02/10/23 0426 02/10/23 1409 02/11/23 0400 02/11/23 1652 02/12/23 0655  HGB 8.9*  --  8.0*  --   --   HCT 26.1*  --  23.6*  --   --   PLT 207  --  194  --   --   HEPARINUNFRC 0.24*   < > 0.69 0.77* 0.40  CREATININE 2.37*  --  2.30* 2.43* 2.45*   < > = values in this interval not displayed.    Estimated Creatinine Clearance: 28.4 mL/min (A) (by C-G formula based on SCr of 2.45 mg/dL (H)).  Assessment: 58 yo female admitted for CHF and doppler positive for DVT.  VQ shows perfusion defects both lower lungs consistent with presence of PE.  Pharmacy dosing heparin .   2/7: Heparin  level supra-therapeutic at 0.77 units/mL.  Patient is bleeding from PICC, so heparin  was held per MD.  2/8 AM: Heparin  resumed at ~0000 on 2/8. No further bleeding from PICC line noted per RN. Heparin  level 0.40 at 0700, therapeutic on 1750 units/hr.  Goal of Therapy:  Heparin  level 0.3-0.7 units/ml Monitor platelets by anticoagulation protocol: Yes   Plan:  Continue heparin  at 1750 units/hr Check confirmatory heparin  level in 8 hours Daily heparin  level and CBC Monitor for any recurring bleeding  Morna Breach, PharmD PGY-1 Acute Care Pharmacy Resident 02/12/2023 8:25 AM

## 2023-02-12 NOTE — Plan of Care (Signed)
  Problem: Education: Goal: Knowledge of General Education information will improve Description: Including pain rating scale, medication(s)/side effects and non-pharmacologic comfort measures Outcome: Progressing   Problem: Health Behavior/Discharge Planning: Goal: Ability to manage health-related needs will improve Outcome: Progressing   Problem: Clinical Measurements: Goal: Ability to maintain clinical measurements within normal limits will improve Outcome: Progressing Goal: Will remain free from infection Outcome: Progressing Goal: Diagnostic test results will improve Outcome: Progressing Goal: Respiratory complications will improve Outcome: Progressing Goal: Cardiovascular complication will be avoided Outcome: Progressing   Problem: Activity: Goal: Risk for activity intolerance will decrease Outcome: Progressing   Problem: Nutrition: Goal: Adequate nutrition will be maintained Outcome: Progressing   Problem: Coping: Goal: Level of anxiety will decrease Outcome: Progressing   Problem: Elimination: Goal: Will not experience complications related to bowel motility Outcome: Progressing Goal: Will not experience complications related to urinary retention Outcome: Progressing   Problem: Pain Managment: Goal: General experience of comfort will improve and/or be controlled Outcome: Progressing   Problem: Safety: Goal: Ability to remain free from injury will improve Outcome: Progressing   Problem: Skin Integrity: Goal: Risk for impaired skin integrity will decrease Outcome: Progressing   Problem: Education: Goal: Ability to demonstrate management of disease process will improve Outcome: Progressing Goal: Ability to verbalize understanding of medication therapies will improve Outcome: Progressing Goal: Individualized Educational Video(s) Outcome: Progressing   Problem: Activity: Goal: Capacity to carry out activities will improve Outcome: Progressing    Problem: Cardiac: Goal: Ability to achieve and maintain adequate cardiopulmonary perfusion will improve Outcome: Progressing   Problem: Consults Goal: Venous Thromboembolism Patient Education Description: See Patient Education Module for education specifics. Outcome: Progressing Goal: Diagnosis - Venous Thromboembolism (VTE) Description: Choose a selection Outcome: Progressing Goal: Pharmacy Consult for anticoagulation Outcome: Progressing Note: End date is undetermined per pharmacy d/t patient having a DVT Goal: Skin Care Protocol Initiated - if Braden Score 18 or less Description: If consults are not indicated, leave blank or document N/A Outcome: Progressing Goal: Nutrition Consult-if indicated Outcome: Progressing Goal: Diabetes Guidelines if Diabetic/Glucose > 140 Description: If diabetic or lab glucose is > 140 mg/dl - Initiate Diabetes/Hyperglycemia Guidelines & Document Interventions  Outcome: Progressing   Problem: Phase I Progression Outcomes Goal: Pain controlled with appropriate interventions Outcome: Progressing Goal: Dyspnea controlled at rest (PE) Outcome: Progressing Goal: Tolerating diet Outcome: Progressing Goal: Initial discharge plan identified Outcome: Progressing Goal: Voiding-avoid urinary catheter unless indicated Outcome: Progressing Goal: Hemodynamically stable Outcome: Progressing Goal: Other Phase I Outcomes/Goals Outcome: Progressing   Problem: Phase II Progression Outcomes Goal: Therapeutic drug levels for anticoagulation Outcome: Progressing Goal: 02 sats trending upward/stable (PE) Outcome: Progressing Goal: Discharge plan established Outcome: Progressing Goal: Tolerating diet Outcome: Progressing Goal: Other Phase II Outcomes/Goals Outcome: Progressing   Problem: Phase III Progression Outcomes Goal: 02 sats stabilized Outcome: Progressing Goal: Activity at appropriate level-compared to baseline Description: (UP IN CHAIR FOR  HEMODIALYSIS) Outcome: Progressing Goal: Discharge plan remains appropriate-arrangements made Outcome: Progressing Goal: Other Phase III Outcomes/Goals Outcome: Progressing   Problem: Discharge Progression Outcomes Goal: Barriers To Progression Addressed/Resolved Outcome: Progressing Goal: Discharge plan in place and appropriate Outcome: Progressing Goal: Pain controlled with appropriate interventions Outcome: Progressing Goal: Hemodynamically stable Outcome: Progressing Goal: Complications resolved/controlled Outcome: Progressing Goal: Tolerating diet Outcome: Progressing Goal: Activity appropriate for discharge plan Outcome: Progressing Goal: Other Discharge Outcomes/Goals Outcome: Progressing

## 2023-02-12 NOTE — Plan of Care (Signed)
  Problem: Education: Goal: Knowledge of General Education information will improve Description: Including pain rating scale, medication(s)/side effects and non-pharmacologic comfort measures Outcome: Progressing   Problem: Health Behavior/Discharge Planning: Goal: Ability to manage health-related needs will improve Outcome: Progressing   Problem: Clinical Measurements: Goal: Ability to maintain clinical measurements within normal limits will improve Outcome: Progressing Goal: Will remain free from infection Outcome: Progressing Goal: Diagnostic test results will improve Outcome: Progressing Goal: Respiratory complications will improve Outcome: Progressing Goal: Cardiovascular complication will be avoided Outcome: Progressing   Problem: Activity: Goal: Risk for activity intolerance will decrease Outcome: Progressing   Problem: Coping: Goal: Level of anxiety will decrease Outcome: Progressing   Problem: Nutrition: Goal: Adequate nutrition will be maintained Outcome: Progressing   Problem: Elimination: Goal: Will not experience complications related to bowel motility Outcome: Progressing Goal: Will not experience complications related to urinary retention Outcome: Progressing   Problem: Elimination: Goal: Will not experience complications related to urinary retention Outcome: Progressing   Problem: Pain Managment: Goal: General experience of comfort will improve and/or be controlled Outcome: Progressing   Problem: Safety: Goal: Ability to remain free from injury will improve Outcome: Progressing   Problem: Skin Integrity: Goal: Risk for impaired skin integrity will decrease Outcome: Progressing   Problem: Education: Goal: Ability to demonstrate management of disease process will improve Outcome: Progressing Goal: Ability to verbalize understanding of medication therapies will improve Outcome: Progressing Goal: Individualized Educational Video(s) Outcome:  Progressing   Problem: Activity: Goal: Capacity to carry out activities will improve Outcome: Progressing   Problem: Cardiac: Goal: Ability to achieve and maintain adequate cardiopulmonary perfusion will improve Outcome: Progressing   Problem: Consults Goal: Venous Thromboembolism Patient Education Description: See Patient Education Module for education specifics. Outcome: Progressing Goal: Diagnosis - Venous Thromboembolism (VTE) Description: Choose a selection Outcome: Progressing Goal: Pharmacy Consult for anticoagulation Outcome: Progressing Goal: Skin Care Protocol Initiated - if Braden Score 18 or less Description: If consults are not indicated, leave blank or document N/A Outcome: Progressing Goal: Nutrition Consult-if indicated Outcome: Progressing Goal: Diabetes Guidelines if Diabetic/Glucose > 140 Description: If diabetic or lab glucose is > 140 mg/dl - Initiate Diabetes/Hyperglycemia Guidelines & Document Interventions  Outcome: Progressing   Problem: Phase I Progression Outcomes Goal: Pain controlled with appropriate interventions Outcome: Progressing Goal: Dyspnea controlled at rest (PE) Outcome: Progressing Goal: Tolerating diet Outcome: Progressing Goal: Initial discharge plan identified Outcome: Progressing Goal: Voiding-avoid urinary catheter unless indicated Outcome: Progressing Goal: Hemodynamically stable Outcome: Progressing Goal: Other Phase I Outcomes/Goals Outcome: Progressing   Problem: Phase II Progression Outcomes Goal: Therapeutic drug levels for anticoagulation Outcome: Progressing Goal: 02 sats trending upward/stable (PE) Outcome: Progressing Goal: Discharge plan established Outcome: Progressing Goal: Tolerating diet Outcome: Progressing Goal: Other Phase II Outcomes/Goals Outcome: Progressing   Problem: Phase III Progression Outcomes Goal: 02 sats stabilized Outcome: Progressing Goal: Activity at appropriate level-compared to  baseline Description: (UP IN CHAIR FOR HEMODIALYSIS) Outcome: Progressing Goal: Discharge plan remains appropriate-arrangements made Outcome: Progressing Goal: Other Phase III Outcomes/Goals Outcome: Progressing   Problem: Discharge Progression Outcomes Goal: Barriers To Progression Addressed/Resolved Outcome: Progressing Goal: Discharge plan in place and appropriate Outcome: Progressing Goal: Pain controlled with appropriate interventions Outcome: Progressing Goal: Hemodynamically stable Outcome: Progressing Goal: Complications resolved/controlled Outcome: Progressing Goal: Tolerating diet Outcome: Progressing Goal: Activity appropriate for discharge plan Outcome: Progressing Goal: Other Discharge Outcomes/Goals Outcome: Progressing

## 2023-02-12 NOTE — Progress Notes (Signed)
 Patient ID: [name], female   DOB: [birth year], 58 y.o.   MRN: [id number] [facility] Progress Note   Assessment/ Plan:   1.  Acute kidney injury on chronic kidney disease stage III: She likely has had progression of her underlying chronic kidney disease at baseline, at this time with superimposed acute kidney injury that is cardiorenal in mechanism.  BUN/creatinine unchanged overnight with meager UOP (and possible weight gain) on furosemide /milrinone .  Start BID metolazone ; if does not pick up UOP in the next 24-48hr, may need to start RRT.  2.  Hypocalcemia: Refractory to earlier efforts at medical management.  PTH level elevated with severe vitamin D  deficiency; continue calcitriol  and ergocalciferol  for treatment respectively. 3.  Acute exacerbation of congestive heart failure: Unimpressive urine output/volume unloading with bolus dose of furosemide  and milrinone ; started today on furosemide  drip and will add a single dose of metolazone  4.  Hyponatremia: Appears most consistent with inappropriate ADH state in the setting of CHF exacerbation.  Monitor with diuresis and limit free water  intake.  Subjective:   No acute events overnight, she feels that leg swelling is unchanged.   Objective:   BP (!) 119/55 (BP Location: Left Arm)   Pulse 80   Temp 98.9 F (37.2 C) (Oral)   Resp 18   Ht 5' 1 (1.549 m)   Wt 103.9 kg   LMP  (LMP Unknown)   SpO2 96%   BMI 43.28 kg/m   Intake/Output Summary (Last 24 hours) at 02/12/2023 1034 Last data filed at 02/12/2023 0420 Gross per 24 hour  Intake 920.12 ml  Output 800 ml  Net 120.12 ml   Weight change: 7.76 kg  Physical Exam: Gen: Resting comfortably in bed CVS: Pulse regular rhythm, normal rate, S1 and S2 normal Resp: Clear to auscultation bilaterally without rales/rhonchi Abd: Soft, obese, nontender Ext: 1+ right BKA stump edema with 2+ left leg edema extending to just above the knee  Imaging: DG CHEST PORT 1  VIEW Result Date: 02/12/2023 CLINICAL DATA:  Central line placement. EXAM: PORTABLE CHEST 1 VIEW COMPARISON:  02/09/2023 FINDINGS: Low volume film. The cardio pericardial silhouette is enlarged. Bibasilar collapse/consolidation noted, left greater than right and stable to minimally progressive in the left base. There is pulmonary vascular congestion without overt pulmonary edema. Right PICC line tip overlies the region of the innominate vein confluence. Telemetry leads overlie the chest. IMPRESSION: 1. Right PICC line tip overlies the region of the innominate vein confluence. 2. Low volume film with bibasilar collapse/consolidation, left greater than right. Electronically Signed   By: Camellia Candle M.D.   On: 02/12/2023 07:44   US  EKG SITE RITE Result Date: 02/10/2023 If Site Rite image not attached, placement could not be confirmed due to current cardiac rhythm.   Labs: BMET Recent Labs  Lab 02/07/23 0405 02/08/23 0447 02/09/23 0428 02/10/23 0426 02/11/23 0400 02/11/23 1652 02/12/23 0655  NA 132* 132* 128* 129* 126* 126* 127*  K 4.1 3.9 4.0 4.0 3.4* 3.9 4.2  CL 107 101 99 101 98 98 98  CO2 16* 16* 15* 15* 15* 15* 16*  GLUCOSE 132* 126* 98 113* 193* 200* 94  BUN 56* 59* 63* 68* 70* 73* 74*  CREATININE 2.23* 2.26* 2.21* 2.37* 2.30* 2.43* 2.45*  CALCIUM  6.2* 6.4* 6.6* 6.5* 6.7* 7.0* 7.2*   CBC Recent Labs  Lab 02/08/23 0447 02/09/23 0428 02/10/23 0426 02/11/23 0400  WBC 8.2 7.7 8.2 8.8  HGB 9.1* 8.9* 8.9* 8.0*  HCT 26.4* 25.9*  26.1* 23.6*  MCV 90.4 89.9 90.0 90.4  PLT 214 208 207 194    Medications:     calcitRIOL   0.25 mcg Oral Daily   calcium  carbonate  1,000 mg of elemental calcium  Oral TID WC   Chlorhexidine  Gluconate Cloth  6 each Topical Daily   gabapentin   400 mg Oral BID   Gerhardt's butt cream   Topical TID   influenza vac split trivalent PF  0.5 mL Intramuscular Tomorrow-1000   metolazone   5 mg Oral BID   sodium bicarbonate   650 mg Oral TID   sodium chloride   flush  10-40 mL Intracatheter Q12H   Vitamin D  (Ergocalciferol )  50,000 Units Oral Q7 days    Gordy Blanch, MD 02/12/2023, 10:34 AM

## 2023-02-13 DIAGNOSIS — I5033 Acute on chronic diastolic (congestive) heart failure: Secondary | ICD-10-CM | POA: Diagnosis not present

## 2023-02-13 LAB — BASIC METABOLIC PANEL
Anion gap: 10 (ref 5–15)
BUN: 75 mg/dL — ABNORMAL HIGH (ref 6–20)
CO2: 17 mmol/L — ABNORMAL LOW (ref 22–32)
Calcium: 7.6 mg/dL — ABNORMAL LOW (ref 8.9–10.3)
Chloride: 99 mmol/L (ref 98–111)
Creatinine, Ser: 2.3 mg/dL — ABNORMAL HIGH (ref 0.44–1.00)
GFR, Estimated: 24 mL/min — ABNORMAL LOW (ref >60)
Glucose, Bld: 168 mg/dL — ABNORMAL HIGH (ref 70–99)
Potassium: 3.9 mmol/L (ref 3.5–5.1)
Sodium: 126 mmol/L — ABNORMAL LOW (ref 135–145)

## 2023-02-13 LAB — CBC
HCT: 23.4 % — ABNORMAL LOW (ref 36.0–46.0)
Hemoglobin: 8.1 g/dL — ABNORMAL LOW (ref 12.0–15.0)
MCH: 30.9 pg (ref 26.0–34.0)
MCHC: 34.6 g/dL (ref 30.0–36.0)
MCV: 89.3 fL (ref 80.0–100.0)
Platelets: 221 10*3/uL (ref 150–400)
RBC: 2.62 MIL/uL — ABNORMAL LOW (ref 3.87–5.11)
RDW: 15.9 % — ABNORMAL HIGH (ref 11.5–15.5)
WBC: 8.7 10*3/uL (ref 4.0–10.5)
nRBC: 0.2 % (ref 0.0–0.2)

## 2023-02-13 LAB — COOXEMETRY PANEL
Carboxyhemoglobin: 2 % — ABNORMAL HIGH (ref 0.5–1.5)
Methemoglobin: 1 % (ref 0.0–1.5)
O2 Saturation: 76.2 %
Total hemoglobin: 8.4 g/dL — ABNORMAL LOW (ref 12.0–16.0)

## 2023-02-13 LAB — MAGNESIUM: Magnesium: 2 mg/dL (ref 1.7–2.4)

## 2023-02-13 LAB — HEPARIN LEVEL (UNFRACTIONATED): Heparin Unfractionated: 0.36 [IU]/mL (ref 0.30–0.70)

## 2023-02-13 MED ORDER — METOLAZONE 5 MG PO TABS
10.0000 mg | ORAL_TABLET | Freq: Two times a day (BID) | ORAL | Status: DC
  Administered 2023-02-13 – 2023-02-14 (×2): 10 mg via ORAL
  Filled 2023-02-13 (×3): qty 2

## 2023-02-13 NOTE — Progress Notes (Signed)
 Patient did not void in over a 10hr span. Patient was bladder scanned with >300 mL. Patient was straight cath it a volume of 700 mL cloudy yellow urine containing sediment. This is Patient's 2nd straight intermittent catheterization.

## 2023-02-13 NOTE — Progress Notes (Signed)
 PHARMACY - ANTICOAGULATION CONSULT NOTE  Pharmacy Consult for heparin  Indication: DVT  No Known Allergies  Patient Measurements: Height: 5' 1 (154.9 cm) Weight: 106 kg (233 lb 11 oz) IBW/kg (Calculated) : 47.8 Heparin  Dosing Weight: 85kg  Vital Signs: Temp: 99.7 F (37.6 C) (02/09 0820) Temp Source: Oral (02/09 0820) BP: 145/49 (02/09 0820) Pulse Rate: 95 (02/09 0820)  Labs: Recent Labs    02/11/23 0400 02/11/23 1652 02/12/23 0655 02/12/23 1554 02/13/23 0440  HGB 8.0*  --   --  8.2* 8.1*  HCT 23.6*  --   --  24.6* 23.4*  PLT 194  --   --  216 221  HEPARINUNFRC 0.69 0.77* 0.40 0.44 0.36  CREATININE 2.30* 2.43* 2.45*  --  2.30*    Estimated Creatinine Clearance: 30.7 mL/min (A) (by C-G formula based on SCr of 2.3 mg/dL (H)).  Assessment: 58 yo female admitted for CHF and doppler positive for DVT.  VQ shows perfusion defects both lower lungs consistent with presence of PE.  Pharmacy dosing heparin .   2/9 AM: Heparin  level 0.36, therapeutic on heparin  1750 units/hr. No issues with infusion running or signs of bleeding per RN. CBC stable (Hgb 8.1, PLT 221).   Goal of Therapy:  Heparin  level 0.3-0.7 units/ml Monitor platelets by anticoagulation protocol: Yes   Plan:  Continue heparin  at 1750 units/hr Daily heparin  level, CBC, s/s bleeding  Morna Breach, PharmD PGY-1 Acute Care Pharmacy Resident 02/13/2023 11:00 AM

## 2023-02-13 NOTE — Progress Notes (Signed)
 Progress Note   Patient: [name] FMW:[id number] DOB: [birth year] DOA: 02/04/2023     8 DOS: the patient was seen and examined on 02/13/2023   Brief hospital course: Patient is a 58 year old female with ITP, diabetes mellitus type 2, CKD stage IIIa, chronic HFpEF, hypertension, CVA, ICH, PVD, right BKA, neurogenic bladder/chronic Foley presented to ED with bilateral lower extremity edema.  Patient reported 1 week of progressively worsening lower extremity edema up to her thighs.  Reported that it was getting difficult for her to get up from the wheelchair as her legs felt tight.  She had been taking Lasix  40 mg twice daily at home but not having much urine output.  Denied any shortness of breath or cough.  She also had a brief episode of left-sided chest pain.   In ED, hemoglobin 9.8, sodium 131, K5.5, bicarb 14, glucose 100, creatinine 2.7, calcium  6.4, mag 2.4, BNP 1180 Chest x-ray showed cardiomegaly with central congestion, pleural effusion. Received IV Lasix  40 mg and IV calcium  gluconate 1 g.   Troponin 226 -  216  Assessment and Plan: Acute on chronic HFpEF - IV lasix  drip - IV milrinone  drip - Metolazone  10 mg PO bid  - Nephro is following    AKI on CKD 3a  - Calcitriol  0.25 mcg PO daily - Sodium bicarb 650 mg PO tid     Hypocalcemia  - Calcium  carbonate PO tid  - Vit D 50,000 units PO every 7 days    Acute RLE DVT & PE w/ severe pulmonary HTN  - IV heparin  drip    Diarrhea  - IV ceftriaxone  2 g daily  - IV flagyl  500 mg q12    Hypervolemic hypoNa+ - Monitor    HTN - Monitor    DM - Gabapentin  400 mg PO bid    Obesity class II - Complicating care     Subjective: Pt seen and examined at the bedside. Urine oupt is poor. Nephrology is monitoring pt's urine outpt carefully and will decide if pt require RRT. Continue lasix  and milrinone  drips.   Physical Exam: Vitals:   02/12/23 1928 02/12/23 2356 02/13/23 0317 02/13/23 0820  BP: (!) 132/47 (!) 147/55 (!)  151/54 (!) 145/49  Pulse: 89 86 84 95  Resp: 20 18 20 20   Temp: 98.1 F (36.7 C) 98.6 F (37 C) 98.9 F (37.2 C) 99.7 F (37.6 C)  TempSrc: Oral Oral Oral Oral  SpO2: 99% 100% 100% 100%  Weight:   106 kg   Height:       HENT:     Head: Atraumatic.     Mouth/Throat:     Mouth: Mucous membranes are moist.  Cardiovascular:     Rate and Rhythm: Normal rate and regular rhythm.  Pulmonary:     Effort: Pulmonary effort is normal.  Abdominal:     Palpations: Abdomen is soft.  Musculoskeletal:        General: Normal range of motion.  Skin:    General: Skin is warm.  Neurological:     Mental Status: She is alert. Mental status is at baseline.  Psychiatric:        Mood and Affect: Mood normal.     Disposition: Status is: Inpatient Remains inpatient appropriate because: IV lasix /milrinone  drip. Possible RRT initiation   Planned Discharge Destination:  Dispo per pt's clinical progress    Time spent: 35 minutes  Author: Enis Riecke , MD 02/13/2023 11:08 AM  For on call review [email or web address].

## 2023-02-13 NOTE — Progress Notes (Signed)
 Rounding Note    Patient Name: [name] Date of Encounter: 02/13/2023  [facility] Cardiologist: Soyla DELENA Merck, MD   Subjective   [name] is a 58 year old female with a history of HFpEF, pulmonary embolus with cor pulmonale, acute on chronic kidney disease, hyponatremia.  She is being seen by nephrology.  Choloxin this morning is stable at 79. She remains hyponatremic-sodium is 126.  Creatinine is fairly stable at 2.3.  She has been followed by Dr. Zenaida of the congestive heart failure team.  I/O are not accurate    Dr. Geralynn is discussing dialysis     Inpatient Medications    Scheduled Meds:  calcitRIOL   0.25 mcg Oral Daily   calcium  carbonate  1,000 mg of elemental calcium  Oral TID WC   Chlorhexidine  Gluconate Cloth  6 each Topical Daily   gabapentin   400 mg Oral BID   Gerhardt's butt cream   Topical TID   influenza vac split trivalent PF  0.5 mL Intramuscular Tomorrow-1000   metolazone   10 mg Oral BID   sodium bicarbonate   650 mg Oral TID   sodium chloride  flush  10-40 mL Intracatheter Q12H   Vitamin D  (Ergocalciferol )  50,000 Units Oral Q7 days   Continuous Infusions:  cefTRIAXone  (ROCEPHIN )  IV 2 g (02/13/23 1105)   furosemide  (LASIX ) 200 mg in dextrose  5 % 100 mL (2 mg/mL) infusion 20 mg/hr (02/13/23 1000)   heparin  1,750 Units/hr (02/13/23 1000)   metronidazole  100 mL/hr at 02/13/23 1000   milrinone  Stopped (02/13/23 0956)   PRN Meds: acetaminophen  **OR** acetaminophen , guaiFENesin -dextromethorphan , oxyCODONE , sodium chloride  flush   Vital Signs    Vitals:   02/12/23 2356 02/13/23 0317 02/13/23 0820 02/13/23 1313  BP: (!) 147/55 (!) 151/54 (!) 145/49 (!) 152/57  Pulse: 86 84 95 90  Resp: 18 20 20 18   Temp: 98.6 F (37 C) 98.9 F (37.2 C) 99.7 F (37.6 C) 99.8 F (37.7 C)  TempSrc: Oral Oral Oral Oral  SpO2: 100% 100% 100% 98%  Weight:  106 kg    Height:        Intake/Output Summary (Last 24 hours) at 02/13/2023  1410 Last data filed at 02/13/2023 1054 Gross per 24 hour  Intake 1084.71 ml  Output 1350 ml  Net -265.29 ml      02/13/2023    3:17 AM 02/12/2023    4:14 AM 02/11/2023    3:53 PM  Last 3 Weights  Weight (lbs) 233 lb 11 oz 229 lb 0.9 oz 243 lb 4.8 oz  Weight (kg) 106 kg 103.9 kg 110.36 kg      Telemetry    NSR - Personally Reviewed  ECG     - Personally Reviewed  Physical Exam   Physical Exam: Blood pressure (!) 152/57, pulse 90, temperature 99.8 F (37.7 C), temperature source Oral, resp. rate 18, height 5' 1 (1.549 m), weight 106 kg, SpO2 98%.       GEN:  Well nourished, well developed in no acute distress HEENT: Normal NECK: No JVD; No carotid bruits LYMPHATICS: No lymphadenopathy CARDIAC: RR , 1-2 / 6 systolic murmur  RESPIRATORY:  Clear to auscultation without rales, wheezing or rhonchi  ABDOMEN: Soft, non-tender, non-distended MUSCULOSKELETAL:   s/p right BKE  2 + leg edema bilat.  SKIN: Warm and dry NEUROLOGIC:  Alert and oriented x 3   Labs    High Sensitivity Troponin:   Recent Labs  Lab 02/05/23 0724 02/05/23 0925  TROPONINIHS 226* 216*  Chemistry Recent Labs  Lab 02/07/23 0405 02/07/23 1429 02/08/23 0447 02/09/23 0428 02/11/23 0400 02/11/23 1652 02/12/23 0655 02/13/23 0440  NA 132*  --  132*   < > 126* 126* 127* 126*  K 4.1  --  3.9   < > 3.4* 3.9 4.2 3.9  CL 107  --  101   < > 98 98 98 99  CO2 16*  --  16*   < > 15* 15* 16* 17*  GLUCOSE 132*  --  126*   < > 193* 200* 94 168*  BUN 56*  --  59*   < > 70* 73* 74* 75*  CREATININE 2.23*  --  2.26*   < > 2.30* 2.43* 2.45* 2.30*  CALCIUM  6.2*  --  6.4*   < > 6.7* 7.0* 7.2* 7.6*  MG  --    < > 1.9   < > 1.7  --  2.2 2.0  PROT 6.7  --   --   --   --   --   --   --   ALBUMIN  2.5*  --  2.7*  --   --   --   --   --   AST 25  --   --   --   --   --   --   --   ALT 15  --   --   --   --   --   --   --   ALKPHOS 169*  --   --   --   --   --   --   --   BILITOT 0.7  --   --   --   --   --   --    --   GFRNONAA 25*  --  25*   < > 24* 23* 23* 24*  ANIONGAP 9  --  15   < > 13 13 13 10    < > = values in this interval not displayed.    Lipids No results for input(s): CHOL, TRIG, HDL, LABVLDL, LDLCALC, CHOLHDL in the last 168 hours.  Hematology Recent Labs  Lab 02/11/23 0400 02/12/23 1554 02/13/23 0440  WBC 8.8 9.0 8.7  RBC 2.61* 2.71* 2.62*  HGB 8.0* 8.2* 8.1*  HCT 23.6* 24.6* 23.4*  MCV 90.4 90.8 89.3  MCH 30.7 30.3 30.9  MCHC 33.9 33.3 34.6  RDW 16.5* 16.1* 15.9*  PLT 194 216 221   Thyroid  No results for input(s): TSH, FREET4 in the last 168 hours.  BNP Recent Labs  Lab 02/08/23 0446  BNP 1,321.8*    DDimer  Recent Labs  Lab 02/07/23 1429  DDIMER 3.48*     Radiology    DG CHEST PORT 1 VIEW Result Date: 02/12/2023 CLINICAL DATA:  Central line placement. EXAM: PORTABLE CHEST 1 VIEW COMPARISON:  02/09/2023 FINDINGS: Low volume film. The cardio pericardial silhouette is enlarged. Bibasilar collapse/consolidation noted, left greater than right and stable to minimally progressive in the left base. There is pulmonary vascular congestion without overt pulmonary edema. Right PICC line tip overlies the region of the innominate vein confluence. Telemetry leads overlie the chest. IMPRESSION: 1. Right PICC line tip overlies the region of the innominate vein confluence. 2. Low volume film with bibasilar collapse/consolidation, left greater than right. Electronically Signed   By: Camellia Candle M.D.   On: 02/12/2023 07:44    Cardiac Studies     Patient Profile     58 y.o. female  Assessment & Plan    HFpEF: Continue diuresis. She is diuresing some .  Dr. Geralynn is discussing dialysis with her      2.  History of pulmonary embolism:  on heparin  drip       Total time spent with examination of patient, reviewing notes, discussion with patient -30 minutes     For questions or updates, please contact [facility] Please consult  [email or web address] for contact info under        Signed, Aleene Passe, MD  02/13/2023, 2:10 PM

## 2023-02-13 NOTE — Plan of Care (Signed)
  Problem: Education: Goal: Knowledge of General Education information will improve Description: Including pain rating scale, medication(s)/side effects and non-pharmacologic comfort measures Outcome: Progressing   Problem: Health Behavior/Discharge Planning: Goal: Ability to manage health-related needs will improve Outcome: Progressing   Problem: Clinical Measurements: Goal: Ability to maintain clinical measurements within normal limits will improve Outcome: Progressing Goal: Will remain free from infection Outcome: Progressing Goal: Diagnostic test results will improve Outcome: Progressing Goal: Respiratory complications will improve Outcome: Progressing Goal: Cardiovascular complication will be avoided Outcome: Progressing   Problem: Activity: Goal: Risk for activity intolerance will decrease Outcome: Progressing   Problem: Nutrition: Goal: Adequate nutrition will be maintained Outcome: Progressing   Problem: Coping: Goal: Level of anxiety will decrease Outcome: Progressing   Problem: Elimination: Goal: Will not experience complications related to bowel motility Outcome: Progressing Goal: Will not experience complications related to urinary retention Outcome: Progressing   Problem: Pain Managment: Goal: General experience of comfort will improve and/or be controlled Outcome: Progressing   Problem: Safety: Goal: Ability to remain free from injury will improve Outcome: Progressing   Problem: Skin Integrity: Goal: Risk for impaired skin integrity will decrease Outcome: Progressing   Problem: Education: Goal: Ability to demonstrate management of disease process will improve Outcome: Progressing Goal: Ability to verbalize understanding of medication therapies will improve Outcome: Progressing Goal: Individualized Educational Video(s) Outcome: Progressing   Problem: Activity: Goal: Capacity to carry out activities will improve Outcome: Progressing    Problem: Cardiac: Goal: Ability to achieve and maintain adequate cardiopulmonary perfusion will improve Outcome: Progressing   Problem: Consults Goal: Venous Thromboembolism Patient Education Description: See Patient Education Module for education specifics. Outcome: Progressing Goal: Diagnosis - Venous Thromboembolism (VTE) Description: Choose a selection Outcome: Progressing Goal: Pharmacy Consult for anticoagulation Outcome: Progressing Goal: Skin Care Protocol Initiated - if Braden Score 18 or less Description: If consults are not indicated, leave blank or document N/A Outcome: Progressing Goal: Nutrition Consult-if indicated Outcome: Progressing Goal: Diabetes Guidelines if Diabetic/Glucose > 140 Description: If diabetic or lab glucose is > 140 mg/dl - Initiate Diabetes/Hyperglycemia Guidelines & Document Interventions  Outcome: Progressing   Problem: Phase I Progression Outcomes Goal: Pain controlled with appropriate interventions Outcome: Progressing Goal: Dyspnea controlled at rest (PE) Outcome: Progressing Goal: Tolerating diet Outcome: Progressing Goal: Initial discharge plan identified Outcome: Progressing Goal: Voiding-avoid urinary catheter unless indicated Outcome: Progressing Goal: Hemodynamically stable Outcome: Progressing Goal: Other Phase I Outcomes/Goals Outcome: Progressing   Problem: Phase II Progression Outcomes Goal: Therapeutic drug levels for anticoagulation Outcome: Progressing Goal: 02 sats trending upward/stable (PE) Outcome: Progressing Goal: Discharge plan established Outcome: Progressing Goal: Tolerating diet Outcome: Progressing Goal: Other Phase II Outcomes/Goals Outcome: Progressing   Problem: Phase III Progression Outcomes Goal: 02 sats stabilized Outcome: Progressing Goal: Activity at appropriate level-compared to baseline Description: (UP IN CHAIR FOR HEMODIALYSIS) Outcome: Progressing Goal: Discharge plan remains  appropriate-arrangements made Outcome: Progressing Goal: Other Phase III Outcomes/Goals Outcome: Progressing   Problem: Discharge Progression Outcomes Goal: Barriers To Progression Addressed/Resolved Outcome: Progressing Goal: Discharge plan in place and appropriate Outcome: Progressing Goal: Pain controlled with appropriate interventions Outcome: Progressing Goal: Hemodynamically stable Outcome: Progressing Goal: Complications resolved/controlled Outcome: Progressing Goal: Tolerating diet Outcome: Progressing Goal: Activity appropriate for discharge plan Outcome: Progressing Goal: Other Discharge Outcomes/Goals Outcome: Progressing

## 2023-02-13 NOTE — Progress Notes (Signed)
 Patient ID: [name], female   DOB: [birth year], 58 y.o.   MRN: [id number] [facility] Progress Note   Assessment/ Plan:   1.  Acute kidney injury on chronic kidney disease stage III: She likely has had progression of her underlying chronic kidney disease at baseline, at this time with superimposed acute kidney injury that is cardiorenal in mechanism.  Renal function essentially unchanged overnight with unimpressive urine output charted and erratic weights that are unfortunately not helpful to gauge successful diuresis.  Continue furosemide  drip and twice daily metolazone ; reevaluate closely for need to resort to RRT for volume unloading.  2.  Hypocalcemia: Refractory to earlier efforts at medical management.  PTH level elevated with severe vitamin D  deficiency; continue calcitriol  and ergocalciferol  for treatment respectively. 3.  Acute exacerbation of congestive heart failure: Unimpressive urine output/volume unloading with errors noted in urine collection and erratic weights.  Remains on furosemide  drip/metolazone . 4.  Hyponatremia: Appears most consistent with inappropriate ADH state in the setting of CHF exacerbation.  Monitor with diuresis and limit free water  intake.  Subjective:   Concerned about poor urine output and continued sensation of swelling in her left leg/right thigh.   Objective:   BP (!) 145/49 (BP Location: Left Arm)   Pulse 95   Temp 99.7 F (37.6 C) (Oral)   Resp 20   Ht 5' 1 (1.549 m)   Wt 106 kg   LMP  (LMP Unknown)   SpO2 100%   BMI 44.15 kg/m   Intake/Output Summary (Last 24 hours) at 02/13/2023 9070 Last data filed at 02/13/2023 0800 Gross per 24 hour  Intake 1534.55 ml  Output 1275 ml  Net 259.55 ml   Weight change: -4.36 kg  Physical Exam: Gen: Resting comfortably in bed CVS: Pulse regular rhythm, normal rate, S1 and S2 normal Resp: Clear to auscultation bilaterally without rales/rhonchi Abd: Soft, obese, nontender Ext: 1+ right  BKA stump edema with 2+ left leg edema extending to just above the knee  Imaging: DG CHEST PORT 1 VIEW Result Date: 02/12/2023 CLINICAL DATA:  Central line placement. EXAM: PORTABLE CHEST 1 VIEW COMPARISON:  02/09/2023 FINDINGS: Low volume film. The cardio pericardial silhouette is enlarged. Bibasilar collapse/consolidation noted, left greater than right and stable to minimally progressive in the left base. There is pulmonary vascular congestion without overt pulmonary edema. Right PICC line tip overlies the region of the innominate vein confluence. Telemetry leads overlie the chest. IMPRESSION: 1. Right PICC line tip overlies the region of the innominate vein confluence. 2. Low volume film with bibasilar collapse/consolidation, left greater than right. Electronically Signed   By: Camellia Candle M.D.   On: 02/12/2023 07:44    Labs: BMET Recent Labs  Lab 02/08/23 0447 02/09/23 0428 02/10/23 0426 02/11/23 0400 02/11/23 1652 02/12/23 0655 02/13/23 0440  NA 132* 128* 129* 126* 126* 127* 126*  K 3.9 4.0 4.0 3.4* 3.9 4.2 3.9  CL 101 99 101 98 98 98 99  CO2 16* 15* 15* 15* 15* 16* 17*  GLUCOSE 126* 98 113* 193* 200* 94 168*  BUN 59* 63* 68* 70* 73* 74* 75*  CREATININE 2.26* 2.21* 2.37* 2.30* 2.43* 2.45* 2.30*  CALCIUM  6.4* 6.6* 6.5* 6.7* 7.0* 7.2* 7.6*   CBC Recent Labs  Lab 02/10/23 0426 02/11/23 0400 02/12/23 1554 02/13/23 0440  WBC 8.2 8.8 9.0 8.7  HGB 8.9* 8.0* 8.2* 8.1*  HCT 26.1* 23.6* 24.6* 23.4*  MCV 90.0 90.4 90.8 89.3  PLT 207 194 216 221  Medications:     calcitRIOL   0.25 mcg Oral Daily   calcium  carbonate  1,000 mg of elemental calcium  Oral TID WC   Chlorhexidine  Gluconate Cloth  6 each Topical Daily   gabapentin   400 mg Oral BID   Gerhardt's butt cream   Topical TID   influenza vac split trivalent PF  0.5 mL Intramuscular Tomorrow-1000   metolazone   5 mg Oral BID   sodium bicarbonate   650 mg Oral TID   sodium chloride  flush  10-40 mL Intracatheter Q12H    Vitamin D  (Ergocalciferol )  50,000 Units Oral Q7 days    Gordy Blanch, MD 02/13/2023, 9:29 AM

## 2023-02-14 ENCOUNTER — Encounter (HOSPITAL_COMMUNITY): Payer: Self-pay | Admitting: Internal Medicine

## 2023-02-14 ENCOUNTER — Encounter (HOSPITAL_COMMUNITY): Admission: EM | Disposition: E | Payer: Self-pay | Source: Home / Self Care | Attending: Internal Medicine

## 2023-02-14 DIAGNOSIS — I2609 Other pulmonary embolism with acute cor pulmonale: Secondary | ICD-10-CM

## 2023-02-14 DIAGNOSIS — N1832 Chronic kidney disease, stage 3b: Secondary | ICD-10-CM

## 2023-02-14 DIAGNOSIS — I5033 Acute on chronic diastolic (congestive) heart failure: Secondary | ICD-10-CM | POA: Diagnosis not present

## 2023-02-14 DIAGNOSIS — R059 Cough, unspecified: Secondary | ICD-10-CM | POA: Diagnosis not present

## 2023-02-14 DIAGNOSIS — E876 Hypokalemia: Secondary | ICD-10-CM

## 2023-02-14 DIAGNOSIS — E871 Hypo-osmolality and hyponatremia: Secondary | ICD-10-CM

## 2023-02-14 DIAGNOSIS — D649 Anemia, unspecified: Secondary | ICD-10-CM

## 2023-02-14 DIAGNOSIS — R197 Diarrhea, unspecified: Secondary | ICD-10-CM

## 2023-02-14 HISTORY — PX: CENTRAL LINE INSERTION: CATH118232

## 2023-02-14 HISTORY — PX: RIGHT HEART CATH: CATH118263

## 2023-02-14 LAB — RESPIRATORY PANEL BY PCR

## 2023-02-14 LAB — RENAL FUNCTION PANEL
Albumin: 2.2 g/dL — ABNORMAL LOW (ref 3.5–5.0)
Anion gap: 13 (ref 5–15)
BUN: 62 mg/dL — ABNORMAL HIGH (ref 6–20)
CO2: 18 mmol/L — ABNORMAL LOW (ref 22–32)
Calcium: 7.5 mg/dL — ABNORMAL LOW (ref 8.9–10.3)
Chloride: 98 mmol/L (ref 98–111)
Creatinine, Ser: 1.54 mg/dL — ABNORMAL HIGH (ref 0.44–1.00)
GFR, Estimated: 39 mL/min — ABNORMAL LOW (ref >60)
Glucose, Bld: 119 mg/dL — ABNORMAL HIGH (ref 70–99)
Phosphorus: 3.8 mg/dL (ref 2.5–4.6)
Potassium: 3 mmol/L — ABNORMAL LOW (ref 3.5–5.1)
Sodium: 129 mmol/L — ABNORMAL LOW (ref 135–145)

## 2023-02-14 LAB — CBC
HCT: 23.2 % — ABNORMAL LOW (ref 36.0–46.0)
Hemoglobin: 8.1 g/dL — ABNORMAL LOW (ref 12.0–15.0)
MCH: 30.8 pg (ref 26.0–34.0)
MCHC: 34.9 g/dL (ref 30.0–36.0)
MCV: 88.2 fL (ref 80.0–100.0)
Platelets: 233 10*3/uL (ref 150–400)
RBC: 2.63 MIL/uL — ABNORMAL LOW (ref 3.87–5.11)
RDW: 15.5 % (ref 11.5–15.5)
WBC: 9.4 10*3/uL (ref 4.0–10.5)
nRBC: 0 % (ref 0.0–0.2)

## 2023-02-14 LAB — MRSA NEXT GEN BY PCR, NASAL: MRSA by PCR Next Gen: NOT DETECTED

## 2023-02-14 LAB — BASIC METABOLIC PANEL
Anion gap: 16 — ABNORMAL HIGH (ref 5–15)
Anion gap: 14 (ref 5–15)
Anion gap: 11 (ref 5–15)
BUN: 72 mg/dL — ABNORMAL HIGH (ref 6–20)
BUN: 64 mg/dL — ABNORMAL HIGH (ref 6–20)
BUN: 55 mg/dL — ABNORMAL HIGH (ref 6–20)
CO2: 18 mmol/L — ABNORMAL LOW (ref 22–32)
CO2: 17 mmol/L — ABNORMAL LOW (ref 22–32)
CO2: 21 mmol/L — ABNORMAL LOW (ref 22–32)
Calcium: 7.9 mg/dL — ABNORMAL LOW (ref 8.9–10.3)
Calcium: 7.4 mg/dL — ABNORMAL LOW (ref 8.9–10.3)
Calcium: 7.6 mg/dL — ABNORMAL LOW (ref 8.9–10.3)
Chloride: 93 mmol/L — ABNORMAL LOW (ref 98–111)
Chloride: 99 mmol/L (ref 98–111)
Chloride: 98 mmol/L (ref 98–111)
Creatinine, Ser: 2.22 mg/dL — ABNORMAL HIGH (ref 0.44–1.00)
Creatinine, Ser: 1.58 mg/dL — ABNORMAL HIGH (ref 0.44–1.00)
Creatinine, Ser: 1.34 mg/dL — ABNORMAL HIGH (ref 0.44–1.00)
GFR, Estimated: 25 mL/min — ABNORMAL LOW (ref >60)
GFR, Estimated: 38 mL/min — ABNORMAL LOW (ref >60)
GFR, Estimated: 47 mL/min — ABNORMAL LOW (ref >60)
Glucose, Bld: 151 mg/dL — ABNORMAL HIGH (ref 70–99)
Glucose, Bld: 121 mg/dL — ABNORMAL HIGH (ref 70–99)
Glucose, Bld: 127 mg/dL — ABNORMAL HIGH (ref 70–99)
Potassium: 2.9 mmol/L — ABNORMAL LOW (ref 3.5–5.1)
Potassium: 3 mmol/L — ABNORMAL LOW (ref 3.5–5.1)
Potassium: 3.5 mmol/L (ref 3.5–5.1)
Sodium: 127 mmol/L — ABNORMAL LOW (ref 135–145)
Sodium: 130 mmol/L — ABNORMAL LOW (ref 135–145)
Sodium: 130 mmol/L — ABNORMAL LOW (ref 135–145)

## 2023-02-14 LAB — RESP PANEL BY RT-PCR (RSV, FLU A&B, COVID)  RVPGX2
Influenza A by PCR: NEGATIVE
Influenza B by PCR: NEGATIVE
Resp Syncytial Virus by PCR: NEGATIVE
SARS Coronavirus 2 by RT PCR: NEGATIVE

## 2023-02-14 LAB — POCT I-STAT EG7
Acid-base deficit: 5 mmol/L — ABNORMAL HIGH (ref 0.0–2.0)
Acid-base deficit: 5 mmol/L — ABNORMAL HIGH (ref 0.0–2.0)
Acid-base deficit: 5 mmol/L — ABNORMAL HIGH (ref 0.0–2.0)
Bicarbonate: 20.4 mmol/L (ref 20.0–28.0)
Bicarbonate: 20.4 mmol/L (ref 20.0–28.0)
Bicarbonate: 20.9 mmol/L (ref 20.0–28.0)
Calcium, Ion: 1.07 mmol/L — ABNORMAL LOW (ref 1.15–1.40)
Calcium, Ion: 1.09 mmol/L — ABNORMAL LOW (ref 1.15–1.40)
Calcium, Ion: 1.1 mmol/L — ABNORMAL LOW (ref 1.15–1.40)
HCT: 31 % — ABNORMAL LOW (ref 36.0–46.0)
HCT: 31 % — ABNORMAL LOW (ref 36.0–46.0)
HCT: 32 % — ABNORMAL LOW (ref 36.0–46.0)
Hemoglobin: 10.5 g/dL — ABNORMAL LOW (ref 12.0–15.0)
Hemoglobin: 10.5 g/dL — ABNORMAL LOW (ref 12.0–15.0)
Hemoglobin: 10.9 g/dL — ABNORMAL LOW (ref 12.0–15.0)
O2 Saturation: 75 %
O2 Saturation: 75 %
O2 Saturation: 78 %
Potassium: 3.3 mmol/L — ABNORMAL LOW (ref 3.5–5.1)
Potassium: 3.4 mmol/L — ABNORMAL LOW (ref 3.5–5.1)
Potassium: 3.4 mmol/L — ABNORMAL LOW (ref 3.5–5.1)
Sodium: 132 mmol/L — ABNORMAL LOW (ref 135–145)
Sodium: 132 mmol/L — ABNORMAL LOW (ref 135–145)
Sodium: 132 mmol/L — ABNORMAL LOW (ref 135–145)
TCO2: 22 mmol/L (ref 22–32)
TCO2: 22 mmol/L (ref 22–32)
TCO2: 22 mmol/L (ref 22–32)
pCO2, Ven: 38 mm[Hg] — ABNORMAL LOW (ref 44–60)
pCO2, Ven: 38.5 mm[Hg] — ABNORMAL LOW (ref 44–60)
pCO2, Ven: 38.9 mm[Hg] — ABNORMAL LOW (ref 44–60)
pH, Ven: 7.327 (ref 7.25–7.43)
pH, Ven: 7.338 (ref 7.25–7.43)
pH, Ven: 7.341 (ref 7.25–7.43)
pO2, Ven: 42 mm[Hg] (ref 32–45)
pO2, Ven: 42 mm[Hg] (ref 32–45)
pO2, Ven: 46 mm[Hg] — ABNORMAL HIGH (ref 32–45)

## 2023-02-14 LAB — MAGNESIUM: Magnesium: 1.9 mg/dL (ref 1.7–2.4)

## 2023-02-14 LAB — COOXEMETRY PANEL
Carboxyhemoglobin: 1.5 % (ref 0.5–1.5)
Methemoglobin: 0.8 % (ref 0.0–1.5)
O2 Saturation: 89.7 %
Total hemoglobin: 8.6 g/dL — ABNORMAL LOW (ref 12.0–16.0)

## 2023-02-14 LAB — HEPARIN LEVEL (UNFRACTIONATED): Heparin Unfractionated: 0.45 [IU]/mL (ref 0.30–0.70)

## 2023-02-14 SURGERY — RIGHT HEART CATH
Anesthesia: LOCAL | Laterality: Right

## 2023-02-14 MED ORDER — FENTANYL CITRATE (PF) 100 MCG/2ML IJ SOLN
INTRAMUSCULAR | Status: AC
  Filled 2023-02-14: qty 2

## 2023-02-14 MED ORDER — MIDAZOLAM HCL 2 MG/2ML IJ SOLN
INTRAMUSCULAR | Status: AC
  Filled 2023-02-14: qty 2

## 2023-02-14 MED ORDER — POTASSIUM CHLORIDE CRYS ER 20 MEQ PO TBCR
40.0000 meq | EXTENDED_RELEASE_TABLET | Freq: Once | ORAL | Status: AC
  Administered 2023-02-14: 40 meq via ORAL
  Filled 2023-02-14: qty 2

## 2023-02-14 MED ORDER — HEPARIN (PORCINE) IN NACL 1000-0.9 UT/500ML-% IV SOLN
INTRAVENOUS | Status: DC | PRN
  Administered 2023-02-14 (×2): 500 mL

## 2023-02-14 MED ORDER — HEPARIN SODIUM (PORCINE) 1000 UNIT/ML DIALYSIS
1000.0000 [IU] | INTRAMUSCULAR | Status: DC | PRN
  Administered 2023-02-15: 3000 [IU] via INTRAVENOUS_CENTRAL
  Administered 2023-02-16 – 2023-02-17 (×2): 2400 [IU] via INTRAVENOUS_CENTRAL
  Filled 2023-02-14 (×2): qty 6
  Filled 2023-02-14: qty 3
  Filled 2023-02-14: qty 1
  Filled 2023-02-14 (×2): qty 3
  Filled 2023-02-14: qty 6
  Filled 2023-02-14: qty 3

## 2023-02-14 MED ORDER — HEPARIN (PORCINE) 2000 UNITS/L FOR CRRT: INTRAVENOUS_CENTRAL | Status: DC | PRN

## 2023-02-14 MED ORDER — MENTHOL 3 MG MT LOZG: 1.0000 | LOZENGE | Freq: Once | OROMUCOSAL | Status: AC

## 2023-02-14 MED ORDER — SODIUM CHLORIDE 0.9 % IV SOLN: 500.0000 [IU]/h | INTRAVENOUS | Status: DC

## 2023-02-14 MED ORDER — MIDAZOLAM HCL 2 MG/2ML IJ SOLN
INTRAMUSCULAR | Status: DC | PRN
Start: 2023-02-14 — End: 2023-02-14
  Administered 2023-02-14: 1 mg via INTRAVENOUS

## 2023-02-14 MED ORDER — MILRINONE LACTATE IN DEXTROSE 20-5 MG/100ML-% IV SOLN
INTRAVENOUS | Status: AC
  Filled 2023-02-14: qty 100

## 2023-02-14 MED ORDER — PRISMASOL BGK 4/2.5 32-4-2.5 MEQ/L EC SOLN: Status: DC

## 2023-02-14 MED ORDER — LIDOCAINE HCL (PF) 1 % IJ SOLN
INTRAMUSCULAR | Status: DC | PRN
  Administered 2023-02-14: 5 mL

## 2023-02-14 MED ORDER — SILDENAFIL CITRATE 20 MG PO TABS
20.0000 mg | ORAL_TABLET | Freq: Three times a day (TID) | ORAL | Status: DC
  Administered 2023-02-14 – 2023-02-18 (×13): 20 mg via ORAL
  Filled 2023-02-14 (×15): qty 1

## 2023-02-14 MED ORDER — ACETAMINOPHEN 160 MG/5ML PO SOLN
650.0000 mg | Freq: Four times a day (QID) | ORAL | Status: DC | PRN
  Administered 2023-02-15 – 2023-02-17 (×3): 650 mg via ORAL
  Filled 2023-02-14 (×4): qty 20.3

## 2023-02-14 MED ORDER — LIDOCAINE HCL (PF) 1 % IJ SOLN
INTRAMUSCULAR | Status: AC
  Filled 2023-02-14: qty 30

## 2023-02-14 MED ORDER — FENTANYL CITRATE (PF) 100 MCG/2ML IJ SOLN
INTRAMUSCULAR | Status: DC | PRN
  Administered 2023-02-14: 25 ug via INTRAVENOUS

## 2023-02-14 MED ORDER — ACETAMINOPHEN 325 MG PO TABS
650.0000 mg | ORAL_TABLET | Freq: Four times a day (QID) | ORAL | Status: DC | PRN
  Administered 2023-02-14 – 2023-02-18 (×11): 650 mg via ORAL
  Filled 2023-02-14 (×11): qty 2

## 2023-02-14 MED ORDER — HEPARIN SODIUM (PORCINE) 1000 UNIT/ML IJ SOLN
INTRAMUSCULAR | Status: AC
  Filled 2023-02-14: qty 10

## 2023-02-14 MED ORDER — GABAPENTIN 100 MG PO CAPS
200.0000 mg | ORAL_CAPSULE | Freq: Two times a day (BID) | ORAL | Status: DC
  Administered 2023-02-14 – 2023-02-18 (×8): 200 mg via ORAL
  Filled 2023-02-14 (×9): qty 2

## 2023-02-14 MED ORDER — SODIUM CHLORIDE 0.9 % IV SOLN: INTRAVENOUS | Status: AC | PRN

## 2023-02-14 MED ORDER — SODIUM CHLORIDE 0.9 % IV SOLN: INTRAVENOUS | Status: DC

## 2023-02-14 MED ORDER — MENTHOL 3 MG MT LOZG
1.0000 | LOZENGE | OROMUCOSAL | Status: DC | PRN
  Administered 2023-02-14 (×2): 3 mg via ORAL
  Filled 2023-02-14: qty 9

## 2023-02-14 SURGICAL SUPPLY — 8 items
CATH SWAN GANZ 7F STRAIGHT (CATHETERS) IMPLANT
PACK CARDIAC CATHETERIZATION (CUSTOM PROCEDURE TRAY) ×2 IMPLANT
SHEATH PINNACLE 7F 10CM (SHEATH) IMPLANT
SHEATH PROBE COVER 6X72 (BAG) IMPLANT
TRANSDUCER W/STOPCOCK (MISCELLANEOUS) IMPLANT
TUBING ART PRESS 72 MALE/FEM (TUBING) IMPLANT
WIRE MICRO SET SILHO 5FR 7 (SHEATH) IMPLANT
WIRE MICROINTRODUCER 60CM (WIRE) IMPLANT

## 2023-02-14 NOTE — Progress Notes (Signed)
 Patient ID: REMEL RUSIN, female   DOB: 15-Apr-1965, 58 y.o.   MRN: [id number] [facility] Progress Note   Assessment/ Plan:   1.  Acute kidney injury on chronic kidney disease stage III: She likely has had progression of her underlying chronic kidney disease at baseline, at this time with superimposed acute kidney injury that is cardiorenal in mechanism (albeit improving? vs dilutional).  Renal function essentially unchanged overnight with unimpressive urine output charted and elevated CVP. Discussed with cardiology for plans to transfer to ICU for volume unloading with CRRT/UF.  2.  Hypocalcemia: Refractory to earlier efforts at medical management.  PTH level elevated with severe vitamin D  deficiency; continue calcitriol  and ergocalciferol  for treatment respectively. 3.  Acute exacerbation of congestive heart failure: Unimpressive urine output/volume unloading with errors noted in urine collection and erratic weights.  DC furosemide  drip/metolazone  after starting CRRT. 4.  Hyponatremia: Appears most consistent with inappropriate ADH state in the setting of CHF exacerbation.  Monitor with CRRT/UF and limit free water  intake.  Subjective:   UOP unimpressive, feeling poorly with possible viral illness onset.   Objective:   BP (!) 134/55 (BP Location: Left Arm)   Pulse 87   Temp 98.9 F (37.2 C) (Oral)   Resp 18   Ht 5\' 1"  (1.549 m)   Wt 107.3 kg   LMP  (LMP Unknown)   SpO2 100%   BMI 44.70 kg/m   Intake/Output Summary (Last 24 hours) at 02/14/2023 1208 Last data filed at 02/14/2023 0945 Gross per 24 hour  Intake 1467.89 ml  Output 851 ml  Net 616.89 ml   Weight change: 1.3 kg  Physical Exam: Gen: Resting uncomfortably in bed. CVS: Pulse regular rhythm, normal rate, S1 and S2 normal Resp: Clear to auscultation bilaterally without rales/rhonchi Abd: Soft, obese, nontender Ext: 1+ right BKA stump edema with 2+ left leg edema extending to just above the  knee  Imaging: No results found.   Labs: BMET Recent Labs  Lab 02/09/23 0428 02/10/23 0426 02/11/23 0400 02/11/23 1652 02/12/23 0655 02/13/23 0440 02/14/23 0343  NA 128* 129* 126* 126* 127* 126* 127*  K 4.0 4.0 3.4* 3.9 4.2 3.9 2.9*  CL 99 101 98 98 98 99 93*  CO2 15* 15* 15* 15* 16* 17* 18*  GLUCOSE 98 113* 193* 200* 94 168* 151*  BUN 63* 68* 70* 73* 74* 75* 72*  CREATININE 2.21* 2.37* 2.30* 2.43* 2.45* 2.30* 2.22*  CALCIUM  6.6* 6.5* 6.7* 7.0* 7.2* 7.6* 7.9*   CBC Recent Labs  Lab 02/11/23 0400 02/12/23 1554 02/13/23 0440 02/14/23 0343  WBC 8.8 9.0 8.7 9.4  HGB 8.0* 8.2* 8.1* 8.1*  HCT 23.6* 24.6* 23.4* 23.2*  MCV 90.4 90.8 89.3 88.2  PLT 194 216 221 233    Medications:     calcitRIOL   0.25 mcg Oral Daily   calcium  carbonate  1,000 mg of elemental calcium  Oral TID WC   Chlorhexidine  Gluconate Cloth  6 each Topical Daily   gabapentin   200 mg Oral BID   Gerhardt's butt cream   Topical TID   influenza vac split trivalent PF  0.5 mL Intramuscular Tomorrow-1000   metolazone   10 mg Oral BID   potassium chloride   40 mEq Oral Once   sodium bicarbonate   650 mg Oral TID   sodium chloride  flush  10-40 mL Intracatheter Q12H   Vitamin D  (Ergocalciferol )  50,000 Units Oral Q7 days    Clevester Dally, MD 02/14/2023, 12:08 PM

## 2023-02-14 NOTE — Consult Note (Signed)
 NAME:  [name], MRN:  [id number], DOB:  17-Sep-1965, LOS: 9 ADMISSION DATE:  02/04/2023, CONSULTATION DATE:  2/10 REFERRING MD:  Odell Benders, CHIEF COMPLAINT:  severe RV failure, renal failure   History of Present Illness:  [name] is a 58 year old woman admitted on 02/05/2023 after presenting with lower extremity edema pleural effusions on chest x-ray.  She had had worsening lower extremity edema for about a week prior to admission.  She admits to generalized weakness, decreased urine output despite using Lasix .  She had 1 episode of left-sided chest pain that resolved.  No shortness of breath or cough at baseline.  Since admission she has had progressive renal failure, been diagnosed with bilateral LL PE, and progressive RHF due to inability to diurese despite milrinone  and lasix  infusions. She underwent RHC today with high CO and severe PH- mixed venous & arterial with borderline PCWP. HD catheter was placed in cath lab and she was transferred to ICU for CRRT.  She denies complaints currently.   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 rate (!) 25, height 5\' 1"  (1.549 m), weight 103 kg, SpO2 93%. CVP:  [16 mmHg-19 mmHg] 16 mmHg      Intake/Output Summary (Last 24 hours) at 02/14/2023 1722 Last data filed at 02/14/2023 0945 Gross per 24 hour  Intake 1121.79 ml  Output 651 ml  Net 470.79 ml   Filed Weights   02/13/23 0317 02/14/23 0302 02/14/23 1636  Weight: 106 kg 107.3 kg 103 kg    Examination: General: chronically ill appearing  woman sitting up in bed in NAD HENT: [place]/AT, periorbital edema Neck: RIJ HD catheter Lungs: mild tachypnea, breathing comfortably on RA Cardiovascular: S1S2, prominent P2, RRR Abdomen: obese, soft, NT Extremities: R BKA, L>R LE edema Neuro: awake, slightly sluggish responses Derm: some excoriations, no diffuse rashes.   Na+ 130 Potassium 3 Bicarb 17 BUN 64 Creatinine 1.58 WBC 9.4 H/H 8.1/23.2  Right heart catheterization-  RHC Procedural Findings (on milrinone  0.25: Hemodynamics (mmHg) RA mean 17 RV 82/21 PA 72/26, mean 44 PCWP mean 15  Oxygen  saturations: SVC 78% PA 75% AO 100%  Cardiac Output (Fick) 9.79  Cardiac Index (Fick) 4.83 PVR 3.0 WU  Cardiac Output (Thermo) 9.19  Cardiac Index (Thermo) 4.54 PVR 3.15 WU PAPi 2.7    Echocardiogram LVEF 55 to 60%, grade 1 diastolic dysfunction.  Flattening of the interventricular septum with moderately enlarged RV.  Severely elevated PASP, low normal RV systolic function.  Severely dilated RA.   Resolved Hospital Problem list     Assessment & Plan:  Acute on chronic right heart failure, acute bilateral lower lobe pulmonary emboli with cor pulmonale Severe pulmonary hypertension, mixed arterial and venous - CRRT for volume management after failure of diuretic trial - Start sildenafil , decreasing milrinone  due to high-output heart failure per advanced heart failure team - Telemetry monitoring  AKI on CKD 3b Hypervolemic hyponatremia NAGMA -CRRT per nephrology, goal net -100 to 200 cc/h - Renally dose meds and avoid nephrotoxic meds - Strict I's/O - Renal diet  Hypokalemia -Correct with CRRT  History of diabetes with diabetic neuropathy, peripheral artery disease. A1c 5.4 -Has not been needing insulin , monitoring can add if this is required  Chronic neuropathic pain - Continue gabapentin  at renally adjusted dose  Diarrhea; question if this was heart failure related. No infectious studies completed.  -  Stopping antibiotics (have been on since 2/2)  Acute pulmonary embolism - Heparin  - Plan for DOAC long-term  Chronic anemia due to chronic disease - Transfuse for hemoglobin less than 7 or hemodynamically significant bleeding  Best Practice (right click and "Reselect all SmartList Selections" daily)   Diet/type: Regular consistency (see orders) DVT prophylaxis systemic heparin  Pressure ulcer(s): N/A GI prophylaxis: N/A Lines: Central line and yes and it is still needed Foley:  N/A Code Status:  full code Last date of multidisciplinary goals of care discussion [patient updated 2/10 ]  Labs   CBC: Recent Labs  Lab 02/10/23 0426 02/11/23 0400 02/12/23 1554 02/13/23 0440 02/14/23 0343  WBC 8.2 8.8 9.0 8.7 9.4  HGB 8.9* 8.0* 8.2* 8.1* 8.1*  HCT 26.1* 23.6* 24.6* 23.4* 23.2*  MCV 90.0 90.4 90.8 89.3 88.2  PLT 207 194 216 221 233    Basic Metabolic Panel: Recent Labs  Lab 02/10/23 0426 02/11/23 0400 02/11/23 1652 02/12/23 0655 02/13/23 0440 02/14/23 0343  NA 129* 126* 126* 127* 126* 127*  K 4.0 3.4* 3.9 4.2 3.9 2.9*  CL 101 98 98 98 99 93*  CO2 15* 15* 15* 16* 17* 18*  GLUCOSE 113* 193* 200* 94 168* 151*  BUN 68* 70* 73* 74* 75* 72*  CREATININE 2.37* 2.30* 2.43* 2.45* 2.30* 2.22*  CALCIUM  6.5* 6.7* 7.0* 7.2* 7.6* 7.9*  MG 1.9 1.7  --  2.2 2.0 1.9   GFR: Estimated Creatinine Clearance: 31.2 mL/min (A) (by C-G formula based on SCr of 2.22 mg/dL (H)). Recent Labs  Lab 02/10/23 1259 02/11/23 0400 02/12/23 1554 02/13/23 0440 02/14/23 0343  WBC  --  8.8 9.0 8.7 9.4  LATICACIDVEN 1.4  --   --   --   --     Liver Function Tests: Recent Labs  Lab 02/08/23 0447  ALBUMIN  2.7*   No results for input(s): "LIPASE", "AMYLASE" in the last 168 hours. No results for input(s): "AMMONIA" in the last 168 hours.  ABG    Component Value Date/Time   PHART 7.265 (L) 09/23/2020 1928   PCO2ART 46.5 09/23/2020 1928   PO2ART 171 (H) 09/23/2020 1928   HCO3 20.2  09/23/2020 1928   TCO2 31 09/18/2020 1341   ACIDBASEDEF 5.5 (H) 09/23/2020 1928   O2SAT 89.7 02/14/2023 0343     Coagulation Profile: No results for input(s): "INR", "PROTIME" in the last 168 hours.  Cardiac Enzymes: No results for input(s): "CKTOTAL", "CKMB", "CKMBINDEX", "TROPONINI" in the last 168 hours.  HbA1C: HbA1c, POC (prediabetic range)  Date/Time Value Ref Range Status  08/22/2019 04:39 PM 5.5 (A) 5.7 - 6.4 % Final   HbA1c, POC (controlled diabetic range)  Date/Time Value Ref Range Status  08/22/2019 04:39 PM 5.5 0.0 - 7.0 % Final   HbA1c POC (<> result, manual entry)  Date/Time Value Ref Range Status  08/22/2019 04:39 PM 5.5 4.0 - 5.6 % Final   Hgb A1c MFr Bld  Date/Time Value Ref Range Status  02/05/2023 07:24 AM 5.4 4.8 - 5.6 % Final    Comment:    (NOTE) Pre diabetes:          5.7%-6.4%  Diabetes:              >  6.4%  Glycemic control for   <7.0% adults with diabetes   09/28/2020 03:34 AM 5.5 4.8 - 5.6 % Final    Comment:    (NOTE) Pre diabetes:          5.7%-6.4%  Diabetes:              >6.4%  Glycemic control for   <7.0% adults with diabetes     CBG: No results for input(s): "GLUCAP" in the last 168 hours.  Review of Systems:   Review of Systems  Constitutional:  Negative for chills and fever.  HENT: Negative.    Respiratory:  Negative for cough and wheezing.   Cardiovascular:  Positive for orthopnea and leg swelling. Negative for chest pain.  Gastrointestinal:  Negative for nausea and vomiting.  Genitourinary:  Negative for hematuria.  Skin:  Negative for itching and rash.  Neurological:  Positive for weakness. Negative for focal weakness.     Past Medical History:  She,  has a past medical history of Acute exacerbation of CHF (congestive heart failure) (HCC) (04/11/2019), Anemia, Arthritis, Back pain, Chronic indwelling Foley catheter (03/04/2017), Diabetic foot ulcer associated with type 2 diabetes mellitus (HCC), Hypertension,  Intracerebral hemorrhage (HCC) (As a teenager ), Neuropathy, Stroke (HCC) (~ 1982), and Type II diabetes mellitus (HCC).   Surgical History:   Past Surgical History:  Procedure Laterality Date   AMPUTATION  11/24/2010   Procedure: AMPUTATION FOOT;  Surgeon: Amada Backer, MD;  Location: [facility] OR;  Service: Orthopedics;  Laterality: Right;  Right  FOOT TRANS METATARSAL AMPUTATION   AMPUTATION  07/02/2011   Procedure: AMPUTATION BELOW KNEE;  Surgeon: Amada Backer, MD;  Location: [facility] OR;  Service: Orthopedics;  Laterality: Right;   APPLICATION OF WOUND VAC  07/02/2011   Procedure: APPLICATION OF WOUND VAC;  Surgeon: Amada Backer, MD;  Location: [facility] OR;  Service: Orthopedics;  Laterality: Right;   BRAIN SURGERY  1980's   Previous brain surgery in [place], [place]   CESAREAN SECTION  2004; 2006   COLONOSCOPY N/A 09/17/2015   Procedure: COLONOSCOPY;  Surgeon: Baldo Bonds, MD;  Location: [facility] ENDOSCOPY;  Service: Endoscopy;  Laterality: N/A;   ESOPHAGOGASTRODUODENOSCOPY N/A 09/17/2015   Procedure: ESOPHAGOGASTRODUODENOSCOPY (EGD);  Surgeon: Baldo Bonds, MD;  Location: [facility] ENDOSCOPY;  Service: Endoscopy;  Laterality: N/A;   I & D EXTREMITY  11/24/2010   Procedure: IRRIGATION AND DEBRIDEMENT EXTREMITY;  Surgeon: Amada Backer, MD;  Location: [facility] OR;  Service: Orthopedics;  Laterality: Left;  Debriedment of left plantar ulcer and trimming of toenails   I & D EXTREMITY  07/02/2011   Procedure: IRRIGATION AND DEBRIDEMENT EXTREMITY;  Surgeon: Amada Backer, MD;  Location: [facility] OR;  Service: Orthopedics;  Laterality: Left;  I & D of Left Foot   I & D EXTREMITY  07/06/2011   Procedure: IRRIGATION AND DEBRIDEMENT EXTREMITY;  Surgeon: Amada Backer, MD;  Location: [facility] OR;  Service: Orthopedics;  Laterality: Right;  IRRIGATION/DEBRIDEMENT RIGHT BELOW KNEE AMPUTATION   IR THORACENTESIS ASP PLEURAL SPACE W/IMG GUIDE  09/22/2020   LAPAROSCOPIC SPLENECTOMY N/A 09/18/2020   Procedure: HAND ASSISTED LAPAROSCOPIC SPLENECTOMY;  Surgeon:  Lockie Rima, MD;  Location: [facility] OR;  Service: General;  Laterality: N/A;  120 MINUTES   TUBAL LIGATION  2006     Social History:   reports that she has never smoked. She has never used smokeless tobacco. She reports current alcohol  use of about 2.0 standard drinks of alcohol  per week. She reports that she does not use drugs.  Family History:  Her family history includes Cancer in her father; Diabetes in her brother, father, mother, and son; Hyperlipidemia in her father and mother; Hypertension in her father and mother; Peripheral Artery Disease in her brother.   Allergies No Known Allergies   Home Medications  Prior to Admission medications   Medication Sig Start Date End Date Taking? Authorizing Provider  acetaminophen  (TYLENOL ) 325 MG tablet Take 2 tablets (650 mg total) by mouth every 4 (four) hours as needed for headache or mild pain. 10/24/20  Yes Love, Denney Fisherman  aspirin  EC 81 MG tablet Take 162 mg by mouth daily. Swallow whole.   Yes [provider]  furosemide  (LASIX ) 40 MG tablet Take 1 tablet (40 mg total) by mouth 2 (two) times daily. 10/24/20  Yes Love, Renay Carota, PA-C  gabapentin  (NEURONTIN ) 400 MG capsule Take 1 capsule (400 mg total) by mouth 2 (two) times daily. 01/28/20 08/31/23 Yes Gregoria Leas, NP  isosorbide  mononitrate (IMDUR ) 30 MG 24 hr tablet Take 1.5 tablets (45 mg total) by mouth daily. 10/24/20 08/15/23 Yes Love, Renay Carota, PA-C  loperamide  (IMODIUM ) 2 MG capsule Take 1 capsule (2 mg total) by mouth as needed for diarrhea or loose stools. 10/02/20  Yes Gonfa, Taye T, MD  metoprolol  succinate (TOPROL -XL) 50 MG 24 hr tablet Take 1 tablet (50 mg total) by mouth daily. Take with or immediately following a meal. 01/28/20 09/12/23 Yes King, Marcelene Sep, NP  tetrahydrozoline 0.05 % ophthalmic solution Place 2 drops into both eyes 2 (two) times daily.   Yes [provider]  doxycycline  (VIBRAMYCIN ) 100 MG capsule Take 1 capsule (100 mg total) by mouth 2  (two) times daily. Patient not taking: Reported on 02/05/2023 01/19/23   Blinda Burger, NP     Critical care time: 45 min.    Joesph Mussel, DO 02/14/23 6:42 PM [facility]  For contact information, see Amion. If no response to pager, please call PCCM consult pager. After hours, 7PM- 7AM, please call Elink.

## 2023-02-14 NOTE — Interval H&P Note (Signed)
 History and Physical Interval Note:  02/14/2023 2:54 PM  [name]  has presented today for surgery, with the diagnosis of rv failure.  The various methods of treatment have been discussed with the patient and family. After consideration of risks, benefits and other options for treatment, the patient has consented to  Procedure(s): RIGHT HEART CATH (N/A) as a surgical intervention.  The patient's history has been reviewed, patient examined, no change in status, stable for surgery.  I have reviewed the patient's chart and labs.  Questions were answered to the patient's satisfaction.     Gigi Onstad [employer]

## 2023-02-14 NOTE — Plan of Care (Signed)
 Problem: Education: Goal: Knowledge of General Education information will improve Description: Including pain rating scale, medication(s)/side effects and non-pharmacologic comfort measures Outcome: Progressing   Problem: Health Behavior/Discharge Planning: Goal: Ability to manage health-related needs will improve Outcome: Progressing   Problem: Clinical Measurements: Goal: Ability to maintain clinical measurements within normal limits will improve Outcome: Progressing Goal: Will remain free from infection Outcome: Progressing Goal: Diagnostic test results will improve Outcome: Progressing Goal: Respiratory complications will improve Outcome: Progressing Goal: Cardiovascular complication will be avoided Outcome: Progressing   Problem: Activity: Goal: Risk for activity intolerance will decrease Outcome: Progressing   Problem: Nutrition: Goal: Adequate nutrition will be maintained Outcome: Progressing   Problem: Coping: Goal: Level of anxiety will decrease Outcome: Progressing   Problem: Elimination: Goal: Will not experience complications related to bowel motility Outcome: Progressing Goal: Will not experience complications related to urinary retention Outcome: Progressing   Problem: Pain Managment: Goal: General experience of comfort will improve and/or be controlled Outcome: Progressing   Problem: Safety: Goal: Ability to remain free from injury will improve Outcome: Progressing   Problem: Skin Integrity: Goal: Risk for impaired skin integrity will decrease Outcome: Progressing   Problem: Education: Goal: Ability to demonstrate management of disease process will improve Outcome: Progressing Goal: Ability to verbalize understanding of medication therapies will improve Outcome: Progressing Goal: Individualized Educational Video(s) Outcome: Progressing   Problem: Activity: Goal: Capacity to carry out activities will improve Outcome: Progressing    Problem: Cardiac: Goal: Ability to achieve and maintain adequate cardiopulmonary perfusion will improve Outcome: Progressing   Problem: Consults Goal: Venous Thromboembolism Patient Education Description: See Patient Education Module for education specifics. Outcome: Progressing Goal: Pharmacy Consult for anticoagulation Outcome: Progressing Goal: Skin Care Protocol Initiated - if Braden Score 18 or less Description: If consults are not indicated, leave blank or document N/A Outcome: Progressing Goal: Nutrition Consult-if indicated Outcome: Progressing Goal: Diabetes Guidelines if Diabetic/Glucose > 140 Description: If diabetic or lab glucose is > 140 mg/dl - Initiate Diabetes/Hyperglycemia Guidelines & Document Interventions  Outcome: Progressing   Problem: Phase I Progression Outcomes Goal: Pain controlled with appropriate interventions Outcome: Progressing Goal: Dyspnea controlled at rest (PE) Outcome: Progressing Goal: Tolerating diet Outcome: Progressing Goal: Initial discharge plan identified Outcome: Progressing Goal: Voiding-avoid urinary catheter unless indicated Outcome: Progressing Goal: Hemodynamically stable Outcome: Progressing Goal: Other Phase I Outcomes/Goals Outcome: Progressing   Problem: Phase II Progression Outcomes Goal: Therapeutic drug levels for anticoagulation Outcome: Progressing Goal: 02 sats trending upward/stable (PE) Outcome: Progressing Goal: Discharge plan established Outcome: Progressing Goal: Tolerating diet Outcome: Progressing Goal: Other Phase II Outcomes/Goals Outcome: Progressing   Problem: Phase III Progression Outcomes Goal: 02 sats stabilized Outcome: Progressing Goal: Activity at appropriate level-compared to baseline Description: (UP IN CHAIR FOR HEMODIALYSIS) Outcome: Progressing Goal: Discharge plan remains appropriate-arrangements made Outcome: Progressing Goal: Other Phase III Outcomes/Goals Outcome:  Progressing   Problem: Discharge Progression Outcomes Goal: Barriers To Progression Addressed/Resolved Outcome: Progressing Goal: Discharge plan in place and appropriate Outcome: Progressing Goal: Pain controlled with appropriate interventions Outcome: Progressing Goal: Hemodynamically stable Outcome: Progressing Goal: Complications resolved/controlled Outcome: Progressing Goal: Tolerating diet Outcome: Progressing Goal: Activity appropriate for discharge plan Outcome: Progressing Goal: Other Discharge Outcomes/Goals Outcome: Progressing   Problem: Education: Goal: Understanding of CV disease, CV risk reduction, and recovery process will improve Outcome: Progressing Goal: Individualized Educational Video(s) Outcome: Progressing   Problem: Activity: Goal: Ability to return to baseline activity level will improve Outcome: Progressing   Problem: Cardiovascular: Goal: Ability to achieve and maintain adequate cardiovascular  perfusion will improve Outcome: Progressing Goal: Vascular access site(s) Level 0-1 will be maintained Outcome: Progressing

## 2023-02-14 NOTE — Plan of Care (Signed)

## 2023-02-14 NOTE — Progress Notes (Addendum)
 Advanced Heart Failure Rounding Note  PCP-Cardiologist: Euell Herrlich, MD  HF Cardiologist: Dr. Alease Amend   Chief Complaint: RV failure  Subjective:     CO-OX 90% >>recheck  Remains on 0.25 milrinone   Scr 2.22 CO2 18 K 2.9 Na 127  Bed weight up 3 lb and 926 cc UOP charted last 24 hrs. Remains on lasix  gtt at 20/hr + 10 metolazone  BID.  Not feeling well today. Developed a cough and sore throat overnight.    Objective:   Weight Range: 107.3 kg Body mass index is 44.7 kg/m.   Vital Signs:   Temp:  [98 F (36.7 C)-99.8 F (37.7 C)] 98.9 F (37.2 C) (02/10 0812) Pulse Rate:  [59-90] 87 (02/10 0812) Resp:  [18] 18 (02/10 0812) BP: (121-152)/(49-76) 134/55 (02/10 0812) SpO2:  [98 %-100 %] 100 % (02/10 0812) Weight:  [107.3 kg] 107.3 kg (02/10 0302) Last BM Date : 02/13/23  Weight change: Filed Weights   02/12/23 0414 02/13/23 0317 02/14/23 0302  Weight: 103.9 kg 106 kg 107.3 kg    Intake/Output:   Intake/Output Summary (Last 24 hours) at 02/14/2023 1137 Last data filed at 02/14/2023 0945 Gross per 24 hour  Intake 1603.27 ml  Output 851 ml  Net 752.27 ml      Physical Exam    General:  Fatigued appearing Neck: JVP to jaw.  Cor: PMI nondisplaced. Regular rate & rhythm. No rubs, gallops or murmurs. Lungs: clear Abdomen: soft, nontender, nondistended.  Extremities: no cyanosis, clubbing, rash, 2+ edema, R BKA Neuro: alert & orientedx3. Affect depressed   Patient Profile   [name] is a 58 y.o. female with a hx of ITP s/p splenectomy 2022, DM2, hypertension, anemia, BKA, HFpEF, CVA, ICH, PVD, CKD 4, neurogenic bladder, thyroid  nodules, poor mobility, and poor compliance who is seen for evaluation of cor pulmonale.   Assessment/Plan   Pulmonary embolism/DVT Cor pulmonale - evidence of RV strain on echo 2/1 in the setting of active PE - R popliteal DVT as well - Now on milrinone  for RV strain and diuresis. Remains on 0.25 milrinone ,  CO-OX 89%. Recheck pending - Poor response to lasix  gtt at 20/hr + 10 metolazone  BID.  - Plan for RHC today to better assess hemodynamics. Will likely place HD cath in the lab and plan to initiate CRRT later today for volume removal. Discussed with Nephrology at the bedside.  Informed Consent   Shared Decision Making/Informed Consent The risks [stroke (1 in 1000), death (1 in 1000), kidney failure [usually temporary] (1 in 500), bleeding (1 in 200), allergic reaction [possibly serious] (1 in 200)], benefits (diagnostic support and management of coronary artery disease) and alternatives of a cardiac catheterization were discussed in detail with [name] and she is willing to proceed.       HFpEF - History of poor compliance with diuresis - Echo 6/22: EF 60-65, RV nl - Echo this admit EF 55-60%, G1DD, evidence of RV overload, RA severely dilated, mod to sev TR - poor diuresis, plan as above - GDMT limited by renal function   AKI on CKD 3  - baseline unknown. Last Cr 1.4 in 2022 - component of cardio-renal in the setting of RV failure - renal US  unremarkable - Nephrology following - Poor response to lasix  gtt and metolazone  on inotrope support.  Plan CRRT for volume removal   Elevated troponin - hs-trop 226>216 likely demand ischemia - presented with atypical CP (1 time episode of chest pain that self  resolved and lasted a few seconds). CP likely related to PE. No recurrence. - EKG without changes in ST segment   Hypocalcemia - calcium  replacement per primary   Hypervolemic hyponatremia - Na 127 - Limit free water  intake - Volume management as above  Cough/sore throat - Started last night - Check flu/covid panel - Afebrile  Hypokalemia - K 2.9 - Supp aggressively  Length of Stay: 9  [name] N, PA-C  02/14/2023, 11:37 AM  Advanced Heart Failure Team Pager [phone] (M-F; 7a - 5p)  Please contact [facility] for night-coverage after hours (5p -7a ) and  weekends on [email or web address]

## 2023-02-14 NOTE — H&P (View-Only) (Signed)
 Advanced Heart Failure Rounding Note  PCP-Cardiologist: Euell Herrlich, MD  HF Cardiologist: Dr. Alease Amend   Chief Complaint: RV failure  Subjective:     CO-OX 90% >>recheck  Remains on 0.25 milrinone   Scr 2.22 CO2 18 K 2.9 Na 127  Bed weight up 3 lb and 926 cc UOP charted last 24 hrs. Remains on lasix  gtt at 20/hr + 10 metolazone  BID.  Not feeling well today. Developed a cough and sore throat overnight.    Objective:   Weight Range: 107.3 kg Body mass index is 44.7 kg/m.   Vital Signs:   Temp:  [98 F (36.7 C)-99.8 F (37.7 C)] 98.9 F (37.2 C) (02/10 0812) Pulse Rate:  [59-90] 87 (02/10 0812) Resp:  [18] 18 (02/10 0812) BP: (121-152)/(49-76) 134/55 (02/10 0812) SpO2:  [98 %-100 %] 100 % (02/10 0812) Weight:  [107.3 kg] 107.3 kg (02/10 0302) Last BM Date : 02/13/23  Weight change: Filed Weights   02/12/23 0414 02/13/23 0317 02/14/23 0302  Weight: 103.9 kg 106 kg 107.3 kg    Intake/Output:   Intake/Output Summary (Last 24 hours) at 02/14/2023 1137 Last data filed at 02/14/2023 0945 Gross per 24 hour  Intake 1603.27 ml  Output 851 ml  Net 752.27 ml      Physical Exam    General:  Fatigued appearing Neck: JVP to jaw.  Cor: PMI nondisplaced. Regular rate & rhythm. No rubs, gallops or murmurs. Lungs: clear Abdomen: soft, nontender, nondistended.  Extremities: no cyanosis, clubbing, rash, 2+ edema, R BKA Neuro: alert & orientedx3. Affect depressed   Patient Profile   ANDALASIA OSTRAND is a 58 y.o. female with a hx of ITP s/p splenectomy 2022, DM2, hypertension, anemia, BKA, HFpEF, CVA, ICH, PVD, CKD 4, neurogenic bladder, thyroid  nodules, poor mobility, and poor compliance who is seen for evaluation of cor pulmonale.   Assessment/Plan   Pulmonary embolism/DVT Cor pulmonale - evidence of RV strain on echo 2/1 in the setting of active PE - R popliteal DVT as well - Now on milrinone  for RV strain and diuresis. Remains on 0.25 milrinone ,  CO-OX 89%. Recheck pending - Poor response to lasix  gtt at 20/hr + 10 metolazone  BID.  - Plan for RHC today to better assess hemodynamics. Will likely place HD cath in the lab and plan to initiate CRRT later today for volume removal. Discussed with Nephrology at the bedside.  Informed Consent   Shared Decision Making/Informed Consent The risks [stroke (1 in 1000), death (1 in 1000), kidney failure [usually temporary] (1 in 500), bleeding (1 in 200), allergic reaction [possibly serious] (1 in 200)], benefits (diagnostic support and management of coronary artery disease) and alternatives of a cardiac catheterization were discussed in detail with [name] and she is willing to proceed.       HFpEF - History of poor compliance with diuresis - Echo 6/22: EF 60-65, RV nl - Echo this admit EF 55-60%, G1DD, evidence of RV overload, RA severely dilated, mod to sev TR - poor diuresis, plan as above - GDMT limited by renal function   AKI on CKD 3  - baseline unknown. Last Cr 1.4 in 2022 - component of cardio-renal in the setting of RV failure - renal US  unremarkable - Nephrology following - Poor response to lasix  gtt and metolazone  on inotrope support.  Plan CRRT for volume removal   Elevated troponin - hs-trop 226>216 likely demand ischemia - presented with atypical CP (1 time episode of chest pain that self  resolved and lasted a few seconds). CP likely related to PE. No recurrence. - EKG without changes in ST segment   Hypocalcemia - calcium  replacement per primary   Hypervolemic hyponatremia - Na 127 - Limit free water  intake - Volume management as above  Cough/sore throat - Started last night - Check flu/covid panel - Afebrile  Hypokalemia - K 2.9 - Supp aggressively  Length of Stay: 9  Mujahid Jalomo N, PA-C  02/14/2023, 11:37 AM  Advanced Heart Failure Team Pager [phone] (M-F; 7a - 5p)  Please contact [facility] for night-coverage after hours (5p -7a ) and  weekends on [email or web address]

## 2023-02-14 NOTE — Progress Notes (Signed)
 PHARMACY - ANTICOAGULATION CONSULT NOTE  Pharmacy Consult for heparin  Indication: DVT  No Known Allergies  Patient Measurements: Height: 5\' 1"  (154.9 cm) Weight: 103 kg (227 lb 1.2 oz) IBW/kg (Calculated) : 47.8 Heparin  Dosing Weight: 85kg  Vital Signs: Temp: 98.7 F (37.1 C) (02/10 1636) Temp Source: Oral (02/10 1636) BP: 152/65 (02/10 1636) Pulse Rate: 95 (02/10 1636)  Labs: Recent Labs    02/12/23 0655 02/12/23 0655 02/12/23 1554 02/13/23 0440 02/14/23 0343  HGB  --    < > 8.2* 8.1* 8.1*  HCT  --   --  24.6* 23.4* 23.2*  PLT  --   --  216 221 233  HEPARINUNFRC 0.40  --  0.44 0.36 0.45  CREATININE 2.45*  --   --  2.30* 2.22*   < > = values in this interval not displayed.    Estimated Creatinine Clearance: 31.2 mL/min (A) (by C-G formula based on SCr of 2.22 mg/dL (H)).  Assessment: 58 yo female admitted for CHF and doppler positive for DVT.  VQ shows perfusion defects both lower lungs consistent with presence of PE.  Pharmacy dosing heparin .   Heparin  paused for RHC and HD line placement, ok to resume per Dr Mitzie Anda.  Goal of Therapy:  Heparin  level 0.3-0.7 units/ml Monitor platelets by anticoagulation protocol: Yes   Plan:  Resume heparin  at 1750 units/hr Check heparin  level, CBC in am  Levin Reamer, PharmD, BCPS, [facility] Clinical Pharmacist [phone] Please check AMION for all [facility] Pharmacy numbers 02/14/2023

## 2023-02-14 NOTE — Progress Notes (Signed)
 PT Cancellation Note  Patient Details Name: [name] MRN: [id number] DOB: 02/16/1965   Cancelled Treatment:    Reason Eval/Treat Not Completed: Medical issues which prohibited therapy (Pt with new onset fever, cough, fatigue. Nurse and MD aware and they asked PT to HOLD today as they do more testing.)   Welma Mccombs F Addaleigh Nicholls 02/14/2023, 1:55 PM Peyson Delao M,PT Acute Rehab Services [phone]

## 2023-02-14 NOTE — Progress Notes (Signed)
 PHARMACY - ANTICOAGULATION CONSULT NOTE  Pharmacy Consult for heparin  Indication: DVT  No Known Allergies  Patient Measurements: Height: 5\' 1"  (154.9 cm) Weight: 107.3 kg (236 lb 8.9 oz) IBW/kg (Calculated) : 47.8 Heparin  Dosing Weight: 85kg  Vital Signs: Temp: 98.9 F (37.2 C) (02/10 0812) Temp Source: Oral (02/10 0812) BP: 134/55 (02/10 0812) Pulse Rate: 87 (02/10 0812)  Labs: Recent Labs    02/12/23 0655 02/12/23 0655 02/12/23 1554 02/13/23 0440 02/14/23 0343  HGB  --    < > 8.2* 8.1* 8.1*  HCT  --   --  24.6* 23.4* 23.2*  PLT  --   --  216 221 233  HEPARINUNFRC 0.40  --  0.44 0.36 0.45  CREATININE 2.45*  --   --  2.30* 2.22*   < > = values in this interval not displayed.    Estimated Creatinine Clearance: 32 mL/min (A) (by C-G formula based on SCr of 2.22 mg/dL (H)).  Assessment: 58 yo female admitted for CHF and doppler positive for DVT.  VQ shows perfusion defects both lower lungs consistent with presence of PE.  Pharmacy dosing heparin .   Heparin  level 0.45 this AM, therapeutic on heparin  1750 units/hr. No issues with infusion running or signs of bleeding per RN. CBC stable (Hgb 8.1, PLT 233).   Goal of Therapy:  Heparin  level 0.3-0.7 units/ml Monitor platelets by anticoagulation protocol: Yes   Plan:  Continue heparin  at 1750 units/hr Daily heparin  level, CBC, s/s bleeding  Joanell Mowers, Davey Erp, [employer] [facility] [employer] Clinical Pharmacist  02/14/2023 11:23 AM   [facility] pharmacy phone numbers are listed on [email or web address]

## 2023-02-14 NOTE — Progress Notes (Signed)
 Occupational Therapy Treatment Patient Details Name: [name] MRN: [id number] DOB: 1965-04-19 Today's Date: 02/14/2023   History of present illness Pt is a 58 y.o. female who presented 02/05/23 with bil lower extremity edema. Admitted with acute on chronic diastolic heart failure. Found to have PE and R popliteal DVT 2/5. Hospitalization complicated by poor response to diuresis protocols with plan to transfer to ICU and start CRRT. PMH: ITP, DM2, CKD stage IIIa, chronic HFpEF, HTN, CVA, ICH, PVD, right BKA, neurogenic bladder/chronic Foley   OT comments  Pt with minimal progress towards OT goals today limited by respiratory illness symptoms and BLE edema. Pt required up to Mod A for bed mobility today, able to complete basic UB ADLs sitting EOB but reported too fatigued to attempt transfers OOB today. Continue to recommend inpatient follow up therapy, <3 hours/day at DC.      If plan is discharge home, recommend the following:  A lot of help with walking and/or transfers;A lot of help with bathing/dressing/bathroom   Equipment Recommendations  Wheelchair (measurements OT);Wheelchair cushion (measurements OT) (reports need for new wheelchair; would also benefit from gel or air cushion for wheelchair to prevent skin breakdown)    Recommendations for Other Services      Precautions / Restrictions Precautions Precautions: Fall Precaution Comments: R BKA (prosthesis not present) Restrictions Weight Bearing Restrictions Per Provider Order: No       Mobility Bed Mobility Overal bed mobility: Needs Assistance Bed Mobility: Supine to Sit, Sit to Supine, Rolling     Supine to sit: Mod assist, HOB elevated, Used rails Sit to supine: Contact guard assist   General bed mobility comments: Mod A to scoot to EOB with use of bed pad due to pt difficulty lifting edematous limbs. CGA to return to supine with use of bedrails and OT assist with bed pad    Transfers                    General transfer comment: pt politely declined, reported feeling unable to transfer OOB     Balance Overall balance assessment: Needs assistance Sitting-balance support: No upper extremity supported, Feet supported Sitting balance-Leahy Scale: Fair                                     ADL either performed or assessed with clinical judgement   ADL Overall ADL's : Needs assistance/impaired     Grooming: Set up;Sitting;Oral care;Supervision/safety;Brushing hair Grooming Details (indicate cue type and reason): did need some cues to sequence/problem solve when to use spit basin during task sitting EOB. able to reach up with BUE to brush hair but fatigued                               General ADL Comments: Pt reported not feeling well with observable respiratory illness symptoms. agreeable for EOB ADLs but fatigued with this. Also noted with significant BLE edema w/ pt unable to fit shrinker onto residual limb comfortably    Extremity/Trunk Assessment Upper Extremity Assessment Upper Extremity Assessment: Generalized weakness;Right hand dominant   Lower Extremity Assessment Lower Extremity Assessment: Defer to PT evaluation        Vision   Vision Assessment?: No apparent visual deficits   Perception     Praxis      Cognition Arousal: Alert Behavior During Therapy: [facility] for  tasks assessed/performed, Flat affect Overall Cognitive Status: Impaired/Different from baseline Area of Impairment: Following commands, Problem solving, Safety/judgement                       Following Commands: Follows one step commands consistently, Follows one step commands with increased time Safety/Judgement: Decreased awareness of deficits   Problem Solving: Slow processing, Difficulty sequencing, Requires verbal cues, Requires tactile cues General Comments: cues for problem solving, pleasant, follows directions consistently.        Exercises      Shoulder  Instructions       General Comments pt coughing during session, also reported sore throat and chills - notified nursing and inquired about respiratory tests given current symptoms    Pertinent Vitals/ Pain       Pain Assessment Pain Assessment: Faces Faces Pain Scale: Hurts little more Pain Location: B LE d/t edema Pain Descriptors / Indicators: Discomfort, Grimacing Pain Intervention(s): Monitored during session, Limited activity within patient's tolerance  Home Living                                          Prior Functioning/Environment              Frequency  Min 1X/week        Progress Toward Goals  OT Goals(current goals can now be found in the care plan section)  Progress towards OT goals: Progressing toward goals  Acute Rehab OT Goals Patient Stated Goal: feel better soon OT Goal Formulation: With patient Time For Goal Achievement: 02/21/23 Potential to Achieve Goals: Good ADL Goals Pt Will Perform Lower Body Bathing: with contact guard assist;sitting/lateral leans;sit to/from stand;bed level Pt Will Perform Lower Body Dressing: with contact guard assist;sitting/lateral leans;sit to/from stand;bed level Pt Will Transfer to Toilet: with contact guard assist;stand pivot transfer;bedside commode;squat pivot transfer Pt/caregiver will Perform Home Exercise Program: Increased strength;Both right and left upper extremity;With theraband;Independently;With written HEP provided  Plan      Co-evaluation                 AM-PAC OT "6 Clicks" Daily Activity     Outcome Measure   Help from another person eating meals?: None Help from another person taking care of personal grooming?: A Little Help from another person toileting, which includes using toliet, bedpan, or urinal?: A Lot Help from another person bathing (including washing, rinsing, drying)?: A Lot Help from another person to put on and taking off regular upper body clothing?: A  Little Help from another person to put on and taking off regular lower body clothing?: A Lot 6 Click Score: 16    End of Session    OT Visit Diagnosis: Other abnormalities of gait and mobility (R26.89);Muscle weakness (generalized) (M62.81)   Activity Tolerance Patient limited by fatigue   Patient Left in bed;with call bell/phone within reach   Nurse Communication Mobility status;Other (comment) (swelling, respiratory symptoms)        Time: 6045-4098 OT Time Calculation (min): 27 min  Charges: OT General Charges $OT Visit: 1 Visit OT Treatments $Self Care/Home Management : 8-22 mins $Therapeutic Activity: 8-22 mins  Lawrence Pretty, OTR/L Acute Rehab Services Office: [phone]   Annabella Barr 02/14/2023, 12:38 PM

## 2023-02-14 NOTE — TOC Progression Note (Signed)
 Transition of Care [facility]) - Progression Note    Patient Details  Name: [name] MRN: [id number] Date of Birth: April 24, 1965  Transition of Care [facility]) CM/SW Contact  Ernst Heap Phone Number: [phone] 02/14/2023, 11:59 AM  Clinical Narrative:  11:09 AM- HF CSW attempted to meet with pt at bedside. Pt was sleep. CSW will continue to make attempts to meet with pt at a more appropriate time.   TOC will continue following.      Expected Discharge Plan: Home w Home Health Services Barriers to Discharge: Continued Medical Work up  Expected Discharge Plan and Services In-house Referral: Clinical Social Work Discharge Planning Services: CM Consult Post Acute Care Choice: Home Health Living arrangements for the past 2 months: Single Family Home                 DME Arranged: N/A         HH Arranged: PT, OT HH Agency: [facility] Date [facility] Agency Contacted: 02/07/23 Time HH Agency Contacted: 1632 Representative spoke with at [facility] Agency: Randel Buss   Social Determinants of Health (SDOH) Interventions SDOH Screenings   Food Insecurity: Patient Unable To Answer (02/06/2023)  Housing: Unknown (02/06/2023)  Transportation Needs: No Transportation Needs (02/06/2023)  Utilities: Not At Risk (02/06/2023)  Alcohol  Screen: Low Risk  (09/15/2020)  Depression (PHQ2-9): Low Risk  (04/30/2019)  Financial Resource Strain: Low Risk  (09/15/2020)  Tobacco Use: Low Risk  (02/07/2023)    Readmission Risk Interventions     No data to display

## 2023-02-14 NOTE — Progress Notes (Signed)
  Progress Note   Patient: [name]:[id number] DOB: 07/01/1965 DOA: 02/04/2023     9 DOS: the patient was seen and examined on 02/14/2023   Brief hospital course: Patient is a 58 year old female with ITP, diabetes mellitus type 2, CKD stage IIIa, chronic HFpEF, hypertension, CVA, ICH, PVD, right BKA, neurogenic bladder/chronic Foley presented to ED with bilateral lower extremity edema.  Patient reported 1 week of progressively worsening lower extremity edema up to her thighs.  Reported that it was getting difficult for her to get up from the wheelchair as her legs felt tight.  She had been taking Lasix  40 mg twice daily at home but not having much urine output.  Denied any shortness of breath or cough.  She also had a brief episode of left-sided chest pain.   In ED, hemoglobin 9.8, sodium 131, K5.5, bicarb 14, glucose 100, creatinine 2.7, calcium  6.4, mag 2.4, BNP 1180 Chest x-ray showed cardiomegaly with central congestion, pleural effusion. Received IV Lasix  40 mg and IV calcium  gluconate 1 g.   Troponin 226 -  216  Assessment and Plan: Acute on chronic HFpEF - Nephrology and cardiology have agreed to move the pt to ICU for CRRT/UF - IV milrinone  drip - Metolazone  10 mg PO bid  - Nephro/cardio is following    AKI on CKD 3a  - Calcitriol  0.25 mcg PO daily - Sodium bicarb 650 mg PO tid     Hypocalcemia  - Calcium  carbonate PO tid  - Vit D 50,000 units PO every 7 days    Acute RLE DVT & PE w/ severe pulmonary HTN  - IV heparin  drip    Diarrhea  - IV ceftriaxone  2 g daily  - IV flagyl  500 mg q12    Hypervolemic hypoNa+ - Monitor    HTN - Monitor    DM - Gabapentin  400 mg PO bid    Obesity class II - Complicating care   Subjective: Pt seen and examined at the bedside. Cardio/nephro will move the pt to the ICU for initiation of CRRT/UF. IV lasix  drip stopped.   Physical Exam: Vitals:   02/14/23 0013 02/14/23 0302 02/14/23 0413 02/14/23 0812  BP: (!) 121/49 (!)  139/58 (!) 130/50 (!) 134/55  Pulse:  83  87  Resp:  18  18  Temp:  98.7 F (37.1 C)  98.9 F (37.2 C)  TempSrc:  Oral  Oral  SpO2:  100%  100%  Weight:  107.3 kg    Height:       HENT:     Head: Atraumatic.     Mouth/Throat:     Mouth: Mucous membranes are moist.  Cardiovascular:     Rate and Rhythm: Normal rate and regular rhythm.  Pulmonary:     Effort: Pulmonary effort is normal.  Abdominal:     Palpations: Abdomen is soft.  Musculoskeletal:        General: Normal range of motion.  Skin:    General: Skin is warm.  Neurological:     Mental Status: She is alert. Mental status is at baseline.  Psychiatric:        Mood and Affect: Mood normal.      Disposition: Status is: Inpatient Remains inpatient appropriate because: CRRT initiation   Planned Discharge Destination:  Dispo per pt's clinical progress    Time spent: 35 minutes  Author: Lorin Gawron , MD 02/14/2023 12:36 PM  For on call review [email or web address].

## 2023-02-15 ENCOUNTER — Inpatient Hospital Stay (HOSPITAL_COMMUNITY): Payer: Medicare Other

## 2023-02-15 DIAGNOSIS — D638 Anemia in other chronic diseases classified elsewhere: Secondary | ICD-10-CM

## 2023-02-15 DIAGNOSIS — R519 Headache, unspecified: Secondary | ICD-10-CM

## 2023-02-15 DIAGNOSIS — E440 Moderate protein-calorie malnutrition: Secondary | ICD-10-CM | POA: Insufficient documentation

## 2023-02-15 DIAGNOSIS — N1832 Chronic kidney disease, stage 3b: Secondary | ICD-10-CM | POA: Diagnosis not present

## 2023-02-15 DIAGNOSIS — I2609 Other pulmonary embolism with acute cor pulmonale: Secondary | ICD-10-CM | POA: Diagnosis not present

## 2023-02-15 DIAGNOSIS — N179 Acute kidney failure, unspecified: Secondary | ICD-10-CM | POA: Diagnosis not present

## 2023-02-15 DIAGNOSIS — I5033 Acute on chronic diastolic (congestive) heart failure: Secondary | ICD-10-CM | POA: Diagnosis not present

## 2023-02-15 LAB — RENAL FUNCTION PANEL
Albumin: 2.3 g/dL — ABNORMAL LOW (ref 3.5–5.0)
Albumin: 2.5 g/dL — ABNORMAL LOW (ref 3.5–5.0)
Anion gap: 9 (ref 5–15)
Anion gap: 13 (ref 5–15)
BUN: 46 mg/dL — ABNORMAL HIGH (ref 6–20)
BUN: 34 mg/dL — ABNORMAL HIGH (ref 6–20)
CO2: 22 mmol/L (ref 22–32)
CO2: 22 mmol/L (ref 22–32)
Calcium: 7.8 mg/dL — ABNORMAL LOW (ref 8.9–10.3)
Calcium: 8.2 mg/dL — ABNORMAL LOW (ref 8.9–10.3)
Chloride: 101 mmol/L (ref 98–111)
Chloride: 97 mmol/L — ABNORMAL LOW (ref 98–111)
Creatinine, Ser: 1.41 mg/dL — ABNORMAL HIGH (ref 0.44–1.00)
Creatinine, Ser: 1.36 mg/dL — ABNORMAL HIGH (ref 0.44–1.00)
GFR, Estimated: 44 mL/min — ABNORMAL LOW (ref >60)
GFR, Estimated: 46 mL/min — ABNORMAL LOW (ref >60)
Glucose, Bld: 107 mg/dL — ABNORMAL HIGH (ref 70–99)
Glucose, Bld: 156 mg/dL — ABNORMAL HIGH (ref 70–99)
Phosphorus: 3.1 mg/dL (ref 2.5–4.6)
Phosphorus: 2.5 mg/dL (ref 2.5–4.6)
Potassium: 3.5 mmol/L (ref 3.5–5.1)
Potassium: 3.7 mmol/L (ref 3.5–5.1)
Sodium: 132 mmol/L — ABNORMAL LOW (ref 135–145)
Sodium: 132 mmol/L — ABNORMAL LOW (ref 135–145)

## 2023-02-15 LAB — CBC
HCT: 24.7 % — ABNORMAL LOW (ref 36.0–46.0)
Hemoglobin: 8.3 g/dL — ABNORMAL LOW (ref 12.0–15.0)
MCH: 29.5 pg (ref 26.0–34.0)
MCHC: 33.6 g/dL (ref 30.0–36.0)
MCV: 87.9 fL (ref 80.0–100.0)
Platelets: 259 10*3/uL (ref 150–400)
RBC: 2.81 MIL/uL — ABNORMAL LOW (ref 3.87–5.11)
RDW: 15 % (ref 11.5–15.5)
WBC: 11.7 10*3/uL — ABNORMAL HIGH (ref 4.0–10.5)
nRBC: 0 % (ref 0.0–0.2)

## 2023-02-15 LAB — COOXEMETRY PANEL
Carboxyhemoglobin: 2.3 % — ABNORMAL HIGH (ref 0.5–1.5)
Methemoglobin: 0.7 % (ref 0.0–1.5)
O2 Saturation: 81.2 %
Total hemoglobin: 8.9 g/dL — ABNORMAL LOW (ref 12.0–16.0)

## 2023-02-15 LAB — HEPARIN LEVEL (UNFRACTIONATED): Heparin Unfractionated: 0.44 [IU]/mL (ref 0.30–0.70)

## 2023-02-15 LAB — APTT: aPTT: 144 s — ABNORMAL HIGH (ref 24–36)

## 2023-02-15 LAB — MAGNESIUM: Magnesium: 2.1 mg/dL (ref 1.7–2.4)

## 2023-02-15 MED ORDER — METOPROLOL TARTRATE 5 MG/5ML IV SOLN
5.0000 mg | Freq: Once | INTRAVENOUS | Status: DC
Start: 2023-02-15 — End: 2023-02-15

## 2023-02-15 MED ORDER — BUTALBITAL-APAP-CAFFEINE 50-325-40 MG PO TABS
1.0000 | ORAL_TABLET | Freq: Four times a day (QID) | ORAL | Status: DC | PRN
  Administered 2023-02-15 – 2023-02-17 (×5): 1 via ORAL
  Filled 2023-02-15 (×5): qty 1

## 2023-02-15 MED ORDER — NEPRO/CARBSTEADY PO LIQD
237.0000 mL | Freq: Two times a day (BID) | ORAL | Status: DC
  Administered 2023-02-15 – 2023-02-16 (×3): 237 mL via ORAL
  Filled 2023-02-15 (×2): qty 237

## 2023-02-15 MED ORDER — CALCITRIOL 0.25 MCG PO CAPS
0.5000 ug | ORAL_CAPSULE | Freq: Every day | ORAL | Status: DC
  Administered 2023-02-15 – 2023-02-18 (×4): 0.5 ug via ORAL
  Filled 2023-02-15 (×4): qty 2

## 2023-02-15 MED ORDER — RENA-VITE PO TABS
1.0000 | ORAL_TABLET | Freq: Every day | ORAL | Status: DC
  Administered 2023-02-16 – 2023-02-17 (×2): 1 via ORAL
  Filled 2023-02-15 (×3): qty 1

## 2023-02-15 MED ORDER — PROSOURCE PLUS PO LIQD
30.0000 mL | Freq: Every day | ORAL | Status: DC
  Administered 2023-02-15 – 2023-02-18 (×4): 30 mL via ORAL
  Filled 2023-02-15 (×4): qty 30

## 2023-02-15 MED ORDER — RENA-VITE PO TABS
1.0000 | ORAL_TABLET | Freq: Every day | ORAL | Status: DC
  Administered 2023-02-15: 1
  Filled 2023-02-15: qty 1

## 2023-02-15 MED FILL — Heparin Sodium (Porcine) Inj 1000 Unit/ML: INTRAMUSCULAR | Qty: 10 | Status: AC

## 2023-02-15 NOTE — Plan of Care (Signed)
  Problem: Education: Goal: Knowledge of General Education information will improve Description: Including pain rating scale, medication(s)/side effects and non-pharmacologic comfort measures Outcome: Progressing   Problem: Health Behavior/Discharge Planning: Goal: Ability to manage health-related needs will improve Outcome: Progressing   Problem: Clinical Measurements: Goal: Ability to maintain clinical measurements within normal limits will improve Outcome: Progressing Goal: Will remain free from infection Outcome: Progressing Goal: Diagnostic test results will improve Outcome: Progressing Goal: Respiratory complications will improve Outcome: Progressing Goal: Cardiovascular complication will be avoided Outcome: Progressing   Problem: Activity: Goal: Risk for activity intolerance will decrease Outcome: Progressing   Problem: Nutrition: Goal: Adequate nutrition will be maintained Outcome: Progressing   Problem: Coping: Goal: Level of anxiety will decrease Outcome: Progressing   Problem: Elimination: Goal: Will not experience complications related to bowel motility Outcome: Progressing Goal: Will not experience complications related to urinary retention Outcome: Progressing   Problem: Pain Managment: Goal: General experience of comfort will improve and/or be controlled Outcome: Progressing   Problem: Safety: Goal: Ability to remain free from injury will improve Outcome: Progressing   Problem: Skin Integrity: Goal: Risk for impaired skin integrity will decrease Outcome: Progressing   Problem: Education: Goal: Ability to demonstrate management of disease process will improve Outcome: Progressing Goal: Ability to verbalize understanding of medication therapies will improve Outcome: Progressing Goal: Individualized Educational Video(s) Outcome: Progressing   Problem: Activity: Goal: Capacity to carry out activities will improve Outcome: Progressing    Problem: Cardiac: Goal: Ability to achieve and maintain adequate cardiopulmonary perfusion will improve Outcome: Progressing   Problem: Consults Goal: Pharmacy Consult for anticoagulation Outcome: Progressing Goal: Skin Care Protocol Initiated - if Braden Score 18 or less Description: If consults are not indicated, leave blank or document N/A Outcome: Progressing Goal: Nutrition Consult-if indicated Outcome: Progressing Goal: Diabetes Guidelines if Diabetic/Glucose > 140 Description: If diabetic or lab glucose is > 140 mg/dl - Initiate Diabetes/Hyperglycemia Guidelines & Document Interventions  Outcome: Progressing   Problem: Phase I Progression Outcomes Goal: Pain controlled with appropriate interventions Outcome: Progressing Goal: Dyspnea controlled at rest (PE) Outcome: Progressing Goal: Tolerating diet Outcome: Progressing Goal: Initial discharge plan identified Outcome: Progressing Goal: Voiding-avoid urinary catheter unless indicated Outcome: Progressing Goal: Hemodynamically stable Outcome: Progressing Goal: Other Phase I Outcomes/Goals Outcome: Progressing   Problem: Phase II Progression Outcomes Goal: Therapeutic drug levels for anticoagulation Outcome: Progressing Goal: 02 sats trending upward/stable (PE) Outcome: Progressing Goal: Discharge plan established Outcome: Progressing Goal: Tolerating diet Outcome: Progressing Goal: Other Phase II Outcomes/Goals Outcome: Progressing   Problem: Phase III Progression Outcomes Goal: 02 sats stabilized Outcome: Progressing Goal: Activity at appropriate level-compared to baseline Description: (UP IN CHAIR FOR HEMODIALYSIS) Outcome: Progressing Goal: Discharge plan remains appropriate-arrangements made Outcome: Progressing Goal: Other Phase III Outcomes/Goals Outcome: Progressing   Problem: Discharge Progression Outcomes Goal: Barriers To Progression Addressed/Resolved Outcome: Progressing Goal: Discharge  plan in place and appropriate Outcome: Progressing Goal: Pain controlled with appropriate interventions Outcome: Progressing Goal: Hemodynamically stable Outcome: Progressing Goal: Complications resolved/controlled Outcome: Progressing Goal: Tolerating diet Outcome: Progressing Goal: Activity appropriate for discharge plan Outcome: Progressing Goal: Other Discharge Outcomes/Goals Outcome: Progressing

## 2023-02-15 NOTE — TOC Progression Note (Signed)
Transition of Care [facility]) - Progression Note    Patient Details  Name: [name] MRN: [id number] Date of Birth: 03-Mar-1965  Transition of Care [facility]) CM/SW Contact  Nicanor Bake Phone Number: [phone] 02/15/2023, 3:47 PM  Clinical Narrative:  3:06 pm- HF CSW attempted to meet with pt at bedside. Pt was being seen by staff. CSW will follow up with pt at a more appropriate time.   TOC will continue following.      Expected Discharge Plan: Home w Home Health Services Barriers to Discharge: Continued Medical Work up  Expected Discharge Plan and Services In-house Referral: Clinical Social Work Discharge Planning Services: CM Consult Post Acute Care Choice: Home Health Living arrangements for the past 2 months: Single Family Home                 DME Arranged: N/A         HH Arranged: PT, OT HH Agency: [facility] Date [facility] Agency Contacted: 02/07/23 Time HH Agency Contacted: 1632 Representative spoke with at [facility] Agency: Kandee Keen   Social Determinants of Health (SDOH) Interventions SDOH Screenings   Food Insecurity: Patient Unable To Answer (02/06/2023)  Housing: Unknown (02/06/2023)  Transportation Needs: No Transportation Needs (02/06/2023)  Utilities: Not At Risk (02/06/2023)  Alcohol Screen: Low Risk  (09/15/2020)  Depression (PHQ2-9): Low Risk  (04/30/2019)  Financial Resource Strain: Low Risk  (09/15/2020)  Tobacco Use: Low Risk  (02/14/2023)    Readmission Risk Interventions     No data to display

## 2023-02-15 NOTE — Progress Notes (Signed)
PHARMACY - ANTICOAGULATION CONSULT NOTE  Pharmacy Consult for heparin Indication: DVT  No Known Allergies  Patient Measurements: Height: 5\' 1"  (154.9 cm) Weight: 100 kg (220 lb 7.4 oz) IBW/kg (Calculated) : 47.8 Heparin Dosing Weight: 85kg  Vital Signs: Temp: 98.3 F (36.8 C) (02/11 1100) Temp Source: Oral (02/11 1100) BP: 104/45 (02/11 1400) Pulse Rate: 85 (02/11 1400)  Labs: Recent Labs    02/13/23 0440 02/14/23 0343 02/14/23 1514 02/14/23 1518 02/14/23 1737 02/14/23 1738 02/14/23 2309 02/15/23 0450  HGB 8.1* 8.1* 10.9*  10.5* 10.5*  --   --   --  8.3*  HCT 23.4* 23.2* 32.0*  31.0* 31.0*  --   --   --  24.7*  PLT 221 233  --   --   --   --   --  259  APTT  --   --   --   --   --   --   --  144*  HEPARINUNFRC 0.36 0.45  --   --   --   --   --  0.44  CREATININE 2.30* 2.22*  --   --    < > 1.58* 1.34* 1.41*   < > = values in this interval not displayed.    Estimated Creatinine Clearance: 48.3 mL/min (A) (by C-G formula based on SCr of 1.41 mg/dL (H)).  Assessment: 58 yo female admitted for CHF and doppler positive for DVT.  VQ shows perfusion defects both lower lungs consistent with presence of PE.  Pharmacy dosing heparin.   Heparin paused for RHC and HD line placement 2/10 pm >resumed Heparin drip 1750 uts/hr with heparin level 0.44 at goal CBC stable  On CRRT   Goal of Therapy:  Heparin level 0.3-0.7 units/ml Monitor platelets by anticoagulation protocol: Yes   Plan:  Continue heparin at 1750 units/hr Check heparin level, CBC in am   Leota Sauers Pharm.D. CPP, BCPS Clinical Pharmacist [phone] 02/15/2023 3:26 PM   Please check AMION for all [facility] Pharmacy numbers 02/15/2023

## 2023-02-15 NOTE — Progress Notes (Signed)
 Physical Therapy Treatment Patient Details Name: [name] MRN: [id number] DOB: [birth year] Today's Date: 02/15/2023   History of Present Illness Pt is a 58 y.o. female who presented 02/05/23 with bil lower extremity edema. Admitted with acute on chronic diastolic heart failure. Found to have PE and R popliteal DVT 2/5. Hospitalization complicated by poor response to diuresis protocols with plan to transfer to ICU and start CRRT. PMH: ITP, DM2, CKD stage IIIa, chronic HFpEF, HTN, CVA, ICH, PVD, right BKA, neurogenic bladder/chronic Foley    PT Comments  Pt with transfer to ICU for CRRT. Pt pleasant and cooperative. Pt typically functioning indep at w/c level and reports doing ADLs indep up until a week ago. Limited by CRRT lines however pt was able to perform lateral scoot transfer from EOB to drop arm recliner with min/modA. Pt with onset of fatigue and decreased activity tolerance but appreciative to get out of bed. Pt to continue to benefit from inpatient rehab program > 3 hrs a day to achieve safe mod I w/c level of function for safe transition home.    If plan is discharge home, recommend the following: A little help with bathing/dressing/bathroom;Assistance with cooking/housework;Direct supervision/assist for financial management;Direct supervision/assist for medications management;Assist for transportation;Help with stairs or ramp for entrance;Supervision due to cognitive status;A lot of help with walking and/or transfers   Can travel by private vehicle     No  Equipment Recommendations  None recommended by PT    Recommendations for Other Services OT consult;Rehab consult     Precautions / Restrictions Precautions Precautions: Fall Precaution/Restrictions Comments: R BKA (prosthesis not present) Restrictions Weight Bearing Restrictions Per Provider Order: No     Mobility  Bed Mobility Overal bed mobility: Needs Assistance Bed Mobility: Supine to Sit, Rolling Rolling: Used  rails, Mod assist   Supine to sit: Mod assist, HOB elevated, Used rails     General bed mobility comments: pt modA to achieve full sidelying for rolling L/R to perform hygiene s/p BM. pt with limited use of bilat LEs due to swelling and pain in addition to R BKA    Transfers Overall transfer level: Needs assistance Equipment used: None Transfers: Bed to chair/wheelchair/BSC            Lateral/Scoot Transfers: Mod assist, +2 safety/equipment (RN for CRRT lines) General transfer comment: PT assist with bed pad below pt to aide in clearing bottom over arm of drop arm recliner, increased time however pt able to use UEs to lift self and laterally scoot from bed to drop arm reclienr    Ambulation/Gait               General Gait Details: unable at baseline   Stairs             Wheelchair Mobility     Tilt Bed    Modified Rankin (Stroke Patients Only)       Balance Overall balance assessment: Needs assistance Sitting-balance support: No upper extremity supported, Feet supported Sitting balance-Leahy Scale: Fair Sitting balance - Comments: Static sitting with CGA for safety   Standing balance support: Bilateral upper extremity supported Standing balance-Leahy Scale: Poor Standing balance comment: Reliant on UE support and external physical assistance to clear buttocks from sitting surface, did not attempt full stand today                            Communication Communication Communication: No apparent difficulties  Cognition Arousal: Alert  Behavior During Therapy: WFL for tasks assessed/performed, Flat affect   PT - Cognitive impairments: No apparent impairments (how does have h/o noncompliance of medication and take poor care of self)                                Cueing Cueing Techniques: Verbal cues, Gestural cues  Exercises General Exercises - Lower Extremity Quad Sets: AROM, Both, 10 reps, Supine Long Arc Quad: AROM,  Strengthening, Both, 10 reps, Seated    General Comments General comments (skin integrity, edema, etc.): edema t/o abdomen and bilat LE, skin discoloration t/o LEs      Pertinent Vitals/Pain Pain Assessment Pain Assessment: Faces Faces Pain Scale: Hurts little more Pain Location: B LE d/t edema Pain Descriptors / Indicators: Discomfort, Grimacing    Home Living                          Prior Function            PT Goals (current goals can now be found in the care plan section) Acute Rehab PT Goals Patient Stated Goal: to get stronger PT Goal Formulation: With patient Time For Goal Achievement: 02/20/23 Potential to Achieve Goals: Good Progress towards PT goals: Progressing toward goals    Frequency    Min 1X/week      PT Plan      Co-evaluation              AM-PAC PT "6 Clicks" Mobility   Outcome Measure  Help needed turning from your back to your side while in a flat bed without using bedrails?: A Lot Help needed moving from lying on your back to sitting on the side of a flat bed without using bedrails?: A Lot Help needed moving to and from a bed to a chair (including a wheelchair)?: A Lot Help needed standing up from a chair using your arms (e.g., wheelchair or bedside chair)?: A Lot Help needed to walk in hospital room?: Total Help needed climbing 3-5 steps with a railing? : Total 6 Click Score: 10    End of Session Equipment Utilized During Treatment: Gait belt Activity Tolerance: Patient tolerated treatment well Patient left: with call bell/phone within reach;in bed;in chair;with chair alarm set;with nursing/sitter in room Nurse Communication: Mobility status (RN present to assist with transfer, recommended stedy as an option as well) PT Visit Diagnosis: Unsteadiness on feet (R26.81);Muscle weakness (generalized) (M62.81);Difficulty in walking, not elsewhere classified (R26.2)     Time: 1191-4782 PT Time Calculation (min) (ACUTE ONLY):  31 min  Charges:    $Therapeutic Exercise: 8-22 mins $Therapeutic Activity: 8-22 mins PT General Charges $$ ACUTE PT VISIT: 1 Visit                     Lewis Shock, PT, DPT Acute Rehabilitation Services Secure chat preferred Office #: [phone]    [name] 02/15/2023, 12:51 PM

## 2023-02-15 NOTE — Progress Notes (Signed)
eLink Physician-Brief Progress Note Patient Name: [name] DOB: 09-27-1965 MRN: [id number]   Date of Service  02/15/2023  HPI/Events of Note  Contacted regarding diarrhea. Day team aware and thought possible chronic issue however no infectious w/u completed. Recent abx 2/2-2/10   eICU Interventions  Does not meet requirement for C.diff testing Asked BSRN to assess for need for FMS/collection device if needed     Intervention Category Intermediate Interventions: Infection - evaluation and management  [name] Mechele Collin 02/15/2023, 9:58 PM

## 2023-02-15 NOTE — Progress Notes (Signed)
Initial Nutrition Assessment  DOCUMENTATION CODES:   Non-severe (moderate) malnutrition in context of chronic illness  INTERVENTION:  Nepro Shake po BID, each supplement provides 425 kcal and 19 grams protein Prosource Plus daily. This provides 100kcal and 15g of protein per packet Renal MVI daily Will continue current diet, however if po intake inadequate on follow up, recommend liberalizing   NUTRITION DIAGNOSIS:  Moderate Malnutrition related to chronic illness as evidenced by edema, mild fat depletion, mild muscle depletion, moderate muscle depletion.  GOAL:  Patient will meet greater than or equal to 90% of their needs  MONITOR:  PO intake, Supplement acceptance, Weight trends  REASON FOR ASSESSMENT:  Consult Assessment of nutrition requirement/status  ASSESSMENT:   Pt presented to ED with complaint of bilateral lower extremity edema. Pt with PMH of ITP, DM2, CKD IIIa, chronic HFpEF, HTN, CVA, ICH, PVD, right BKA, neurogenic bladder/chronic foley.   2/1: Admitted 2/10: Admitted to ICU. RHC, HD catheter placed, ICU for CRRT  Pt on 2 g sodium diet with 1800 ml fluid restriction. Per chart review pt eating 25-100% of meals from 2/7-2/10. Pt reports she ate some breakfast today but not much lunch. Pt reports appetite has been decent prior to admission and currently. Pt reports she prefers to eat more small, frequent meals at home. Pt reports living with her son who does the grocery shopping for her. Pt reports she does the cooking at home. Pt reports not tolerating Ensure in the past but is agreeable to trying a different protein shake and prosource. Discussed adding snacks given she often snacks throughout the day at home.  Pt remains on CRRT. Net UF of 100 ml/hour goal.   Pt reports she does not know her usual dry weight. Admission weight: 227 lb Current weight: 220.5 lb  Noted multiple skin lesions on arms and legs upon exam.  Meds: Calcitrol, vitamin D, calcium  carbonate with meals  Labs: sodium low, Glucose range from 107-151 in last 24 hours, BUN 46, Creatinine 1.41, A1C 5.4  NUTRITION - FOCUSED PHYSICAL EXAM:  Flowsheet Row Most Recent Value  Orbital Region Mild depletion  Upper Arm Region Unable to assess  Thoracic and Lumbar Region Unable to assess  Buccal Region Mild depletion  Temple Region Mild depletion  Clavicle Bone Region Mild depletion  Clavicle and Acromion Bone Region Moderate depletion  Scapular Bone Region Moderate depletion  Dorsal Hand Severe depletion  Patellar Region Unable to assess  Anterior Thigh Region Unable to assess  Posterior Calf Region Unable to assess  Edema (RD Assessment) Severe  Hair Reviewed  Eyes Reviewed  Mouth Reviewed  Skin Reviewed  Nails Reviewed       Diet Order:   Diet Order             Diet 2 gram sodium Fluid consistency: Thin; Fluid restriction: 1800 mL Fluid  Diet effective now                   EDUCATION NEEDS:   No education needs have been identified at this time  Skin:  Skin Assessment: Skin Integrity Issues: Skin Integrity Issues:: Stage I Stage I: left buttocks  Last BM:  2/11-Type 5  Height:   Ht Readings from Last 1 Encounters:  02/14/23 5\' 1"  (1.549 m)    Weight:   Wt Readings from Last 1 Encounters:  02/15/23 100 kg    BMI:  Body mass index is 41.66 kg/m.  Estimated Nutritional Needs:   Kcal:  1600-1800  Protein:  80-100 g  Fluid:  </= 1800 mL    Maceo Pro, MS Dietetic Intern

## 2023-02-15 NOTE — Consult Note (Signed)
NAME:  [name], MRN:  [id number], DOB:  [birth year], LOS: 10 ADMISSION DATE:  02/04/2023, CONSULTATION DATE:  2/10 REFERRING MD:  [name], CHIEF COMPLAINT:  severe RV failure, renal failure   History of Present Illness:  [name] is a 58 year old woman admitted on 02/05/2023 after presenting with lower extremity edema pleural effusions on chest x-ray.  She had had worsening lower extremity edema for about a week prior to admission.  She admits to generalized weakness, decreased urine output despite using Lasix.  She had 1 episode of left-sided chest pain that resolved.  No shortness of breath or cough at baseline.  Since admission she has had progressive renal failure, been diagnosed with bilateral LL PE, and progressive RHF due to inability to diurese despite milrinone and lasix infusions. She underwent RHC today with high CO and severe PH- mixed venous & arterial with borderline PCWP. HD catheter was placed in cath lab and she was transferred to ICU for CRRT.  She denies complaints currently.   Pertinent  Medical History  CKD 3a DM 2 Stroke Neuropathy Right BKA due to diabetes Hypertension Peripheral neuropathy Neurogenic bladder with chronic Foley  Significant Hospital Events: Including procedures, antibiotic start and stop dates in addition to other pertinent events   2/1 admitted 2/10 RHC, HD catheter placed, ICU for CRRT  Interim History / Subjective:  C/o headache today. Still on CRRT.  Objective   Blood pressure (!) 80/64, pulse 96, temperature 98.3 F (36.8 C), temperature source Oral, resp. rate 15, height 5\' 1"  (1.549 m), weight 100 kg, SpO2 90%. CVP:  [9 mmHg-28 mmHg] 14 mmHg      Intake/Output Summary (Last 24 hours) at 02/15/2023 1221 Last data filed at 02/15/2023 1200 Gross per 24 hour  Intake 1706.72 ml  Output 3832.5 ml  Net -2125.78 ml   Filed Weights   02/14/23 0302 02/14/23 1636 02/15/23 0500  Weight: 107.3 kg 103 kg 100 kg     Examination: General: chronically ill appearing woman sitting up in bed in NAD  HENT: [place], AT, still some periorbital edema Neck: RIJ  HD catheter.  Lungs: breathing comfortably on [place], CTAB Cardiovascular: S1S2, prominent P2 Abdomen: obese, soft, NT Extremities: edema bilaterally, R BKA Neuro: awake, alert, moving all extremities Derm: no rashes  Na+ 132 Potassium 3.5 Bicarb 22 BUN 46,  Cr  1.41 WBC 11.7 H/H    8.3/24.7 Platelets 259  Right heart catheterization-  RHC Procedural Findings (on milrinone 0.25: Hemodynamics (mmHg) RA mean 17 RV 82/21 PA 72/26, mean 44 PCWP mean 15  Oxygen saturations: SVC 78% PA 75% AO 100%  Cardiac Output (Fick) 9.79  Cardiac Index (Fick) 4.83 PVR 3.0 WU  Cardiac Output (Thermo) 9.19  Cardiac Index (Thermo) 4.54 PVR 3.15 WU PAPi 2.7    Echocardiogram LVEF 55 to 60%, grade 1 diastolic dysfunction.  Flattening of the interventricular septum with moderately enlarged RV.  Severely elevated PASP, low normal RV systolic function.  Severely dilated RA.   Resolved Hospital Problem list   Hypokalemia  Assessment & Plan:  Acute on chronic right heart failure, acute bilateral lower lobe pulmonary emboli with cor pulmonale Severe pulmonary hypertension, mixed arterial and venous - CRRT for volume management -con't milrinone, sildenafil -tele monitoring -afterload reduction as needed   AKI on CKD 3b Hypervolemic hyponatremia NAGMA -CRRT per nephrology -renally dose meds, avoid nephrotoxic meds -strict I/O -renal diet  Headache -acetaminophen PRN; can add Fioricet if needed  History of diabetes with diabetic neuropathy, peripheral artery  disease. A1c 5.4 -not requiring insulin  Chronic neuropathic pain -con't reduced / renally dose gabapentin  Diarrhea; question if this was heart failure related. No infectious studies completed.  - Stopped antibiotics  2/10 (had been on since 2/2) -was on imodium PTA- question if this is  a chronic issue- IBS vs collagenous colitis vs other  Acute pulmonary embolism -heparin gtt -long-term planning for DOAC  Chronic anemia due to chronic disease - transfuse for Hb <7 or hemodynamically significant bleeding  Deconditioning -PT, OT once able to move around more  Best Practice (right click and "Reselect all SmartList Selections" daily)   Diet/type: Regular consistency (see orders) DVT prophylaxis systemic heparin Pressure ulcer(s): N/A GI prophylaxis: N/A Lines: Central line and yes and it is still needed Foley:  N/A Code Status:  full code Last date of multidisciplinary goals of care discussion [patient updated 2/10 ]  Labs   CBC: Recent Labs  Lab 02/11/23 0400 02/12/23 1554 02/13/23 0440 02/14/23 0343 02/14/23 1514 02/14/23 1518 02/15/23 0450  WBC 8.8 9.0 8.7 9.4  --   --  11.7*  HGB 8.0* 8.2* 8.1* 8.1* 10.9*  10.5* 10.5* 8.3*  HCT 23.6* 24.6* 23.4* 23.2* 32.0*  31.0* 31.0* 24.7*  MCV 90.4 90.8 89.3 88.2  --   --  87.9  PLT 194 216 221 233  --   --  259    Basic Metabolic Panel: Recent Labs  Lab 02/11/23 0400 02/11/23 1652 02/12/23 0655 02/13/23 0440 02/14/23 0343 02/14/23 1514 02/14/23 1518 02/14/23 1737 02/14/23 1738 02/14/23 2309 02/15/23 0450  NA 126*   < > 127* 126* 127*   < > 132* 129* 130* 130* 132*  K 3.4*   < > 4.2 3.9 2.9*   < > 3.3* 3.0* 3.0* 3.5 3.5  CL 98   < > 98 99 93*  --   --  98 99 98 101  CO2 15*   < > 16* 17* 18*  --   --  18* 17* 21* 22  GLUCOSE 193*   < > 94 168* 151*  --   --  119* 121* 127* 107*  BUN 70*   < > 74* 75* 72*  --   --  62* 64* 55* 46*  CREATININE 2.30*   < > 2.45* 2.30* 2.22*  --   --  1.54* 1.58* 1.34* 1.41*  CALCIUM 6.7*   < > 7.2* 7.6* 7.9*  --   --  7.5* 7.4* 7.6* 7.8*  MG 1.7  --  2.2 2.0 1.9  --   --   --   --   --  2.1  PHOS  --   --   --   --   --   --   --  3.8  --   --  3.1   < > = values in this interval not displayed.   GFR: Estimated Creatinine Clearance: 48.3 mL/min (A) (by C-G  formula based on SCr of 1.41 mg/dL (H)). Recent Labs  Lab 02/10/23 1259 02/11/23 0400 02/12/23 1554 02/13/23 0440 02/14/23 0343 02/15/23 0450  WBC  --    < > 9.0 8.7 9.4 11.7*  LATICACIDVEN 1.4  --   --   --   --   --    < > = values in this interval not displayed.    Liver Function Tests: Recent Labs  Lab 02/14/23 1737 02/15/23 0450  ALBUMIN 2.2* 2.3*      Critical care time:  This patient is critically ill with multiple organ system failure which requires frequent high complexity decision making, assessment, support, evaluation, and titration of therapies. This was completed through the application of advanced monitoring technologies and extensive interpretation of multiple databases. During this encounter critical care time was devoted to patient care services described in this note for 34 minutes.   Steffanie Dunn, DO 02/15/23 12:40 PM [facility]  For contact information, see Amion. If no response to pager, please call PCCM consult pager. After hours, 7PM- 7AM, please call Elink.

## 2023-02-15 NOTE — Progress Notes (Signed)
[facility] [phone] [facility] Liaison Note  This patient is currently enrolled in [facility] Primary Home Based Care.  Hospital Liaison Team will follow for discharge disposition.  Please call with any questions or concerns.  Thank you, Haynes Bast, [name], [facility] [phone]

## 2023-02-15 NOTE — Progress Notes (Signed)
Patient ID: [name], female   DOB: 1965-12-26, 58 y.o.   MRN: [id number] [facility] Progress Note   Assessment/ Plan:   1.  Acute kidney injury on chronic kidney disease stage III: She likely has had progression of her underlying chronic kidney disease at baseline, at this time with superimposed acute kidney injury that is cardiorenal in mechanism (albeit improving? vs dilutional).  Renal function essentially unchanged overnight with unimpressive urine output charted and elevated CVP. - on CRRT since 2/10 - doing well, continue current 2.  Hypocalcemia: Refractory to earlier efforts at medical management.  PTH level elevated with severe vitamin D deficiency; continue calcitriol and ergocalciferol for treatment respectively. 3.  Acute exacerbation of congestive heart failure: Unimpressive urine output/volume unloading with errors noted in urine collection and erratic weights.  DC furosemide drip/metolazone after starting CRRT. 4.  Hyponatremia: Appears most consistent with inappropriate ADH state in the setting of CHF exacerbation.  Monitor with CRRT/UF and limit free water intake.  Subjective:    Seen in room.  Doing OK with CRRT.  Continue current.     Objective:   BP 96/76   Pulse 98   Temp 98.1 F (36.7 C) (Oral)   Resp (!) 22   Ht 5\' 1"  (1.549 m)   Wt 100 kg   LMP  (LMP Unknown)   SpO2 100%   BMI 41.66 kg/m   Intake/Output Summary (Last 24 hours) at 02/15/2023 0924 Last data filed at 02/15/2023 0900 Gross per 24 hour  Intake 1972.48 ml  Output 3361.5 ml  Net -1389.02 ml   Weight change: -4.3 kg  Physical Exam: Gen: Resting uncomfortably in bed. CVS: Pulse regular rhythm, normal rate, S1 and S2 normal Resp: Clear to auscultation bilaterally without rales/rhonchi Abd: Soft, obese, nontender Ext: 1+ right BKA stump edema with 2+ left leg edema extending to just above the knee  Imaging: CARDIAC CATHETERIZATION Result Date: 02/14/2023 1. Elevated RA  pressure, borderline elevated PCWP. 2. Severe mixed pulmonary arterial/pulmonary venous hypertension. 3. High cardiac output.  No evidence for shunt lesion (no step up in oxygen saturation from SVC to PA). 4. PAPi preserved. With high CO, will decrease milrinone to 0.125. Will add sildenafil 20 mg tid. She is going to start CVVH.     Labs: BMET Recent Labs  Lab 02/12/23 0655 02/13/23 0440 02/14/23 0343 02/14/23 1514 02/14/23 1518 02/14/23 1737 02/14/23 1738 02/14/23 2309 02/15/23 0450  NA 127* 126* 127* 132*  132* 132* 129* 130* 130* 132*  K 4.2 3.9 2.9* 3.4*  3.4* 3.3* 3.0* 3.0* 3.5 3.5  CL 98 99 93*  --   --  98 99 98 101  CO2 16* 17* 18*  --   --  18* 17* 21* 22  GLUCOSE 94 168* 151*  --   --  119* 121* 127* 107*  BUN 74* 75* 72*  --   --  62* 64* 55* 46*  CREATININE 2.45* 2.30* 2.22*  --   --  1.54* 1.58* 1.34* 1.41*  CALCIUM 7.2* 7.6* 7.9*  --   --  7.5* 7.4* 7.6* 7.8*  PHOS  --   --   --   --   --  3.8  --   --  3.1   CBC Recent Labs  Lab 02/12/23 1554 02/13/23 0440 02/14/23 0343 02/14/23 1514 02/14/23 1518 02/15/23 0450  WBC 9.0 8.7 9.4  --   --  11.7*  HGB 8.2* 8.1* 8.1* 10.9*  10.5* 10.5* 8.3*  HCT 24.6* 23.4* 23.2* 32.0*  31.0* 31.0* 24.7*  MCV 90.8 89.3 88.2  --   --  87.9  PLT 216 221 233  --   --  259    Medications:     calcitRIOL  0.25 mcg Oral Daily   calcium carbonate  1,000 mg of elemental calcium Oral TID WC   Chlorhexidine Gluconate Cloth  6 each Topical Daily   gabapentin  200 mg Oral BID   Gerhardt's butt cream   Topical TID   influenza vac split trivalent PF  0.5 mL Intramuscular Tomorrow-1000   sildenafil  20 mg Oral TID   sodium bicarbonate  650 mg Oral TID   sodium chloride flush  10-40 mL Intracatheter Q12H   Vitamin D (Ergocalciferol)  50,000 Units Oral Q7 days    Bufford Buttner, MD 02/15/2023, 9:24 AM

## 2023-02-15 NOTE — Progress Notes (Addendum)
Advanced Heart Failure Rounding Note  PCP-Cardiologist: Parke Poisson, MD  HF Cardiologist: Dr. Elwyn Lade   Chief Complaint: RV failure  Subjective:    CO-OX 81% on 0.125 milrinone  CRRT started yesterday. Pulling for -100/hr, seems to be tolerating well.  Continues with fatigue, cough and sore throat. Respiratory panel negative.   Objective:   Weight Range: 100 kg Body mass index is 41.66 kg/m.   Vital Signs:   Temp:  [97.6 F (36.4 C)-99.6 F (37.6 C)] 98.1 F (36.7 C) (02/11 0836) Pulse Rate:  [74-100] 96 (02/11 1100) Resp:  [14-35] 19 (02/11 1100) BP: (91-152)/(39-94) 137/52 (02/11 1100) SpO2:  [76 %-100 %] 76 % (02/11 1100) Weight:  [100 kg-103 kg] 100 kg (02/11 0500) Last BM Date : 02/15/23  Weight change: Filed Weights   02/14/23 0302 02/14/23 1636 02/15/23 0500  Weight: 107.3 kg 103 kg 100 kg    Intake/Output:   Intake/Output Summary (Last 24 hours) at 02/15/2023 1129 Last data filed at 02/15/2023 1100 Gross per 24 hour  Intake 1675.31 ml  Output 3682.5 ml  Net -2007.19 ml      Physical Exam  CVP 13 General:  Fatigued appearing Neck: supple. JVP to jaw Cor: Regular rate & rhythm. No rubs, gallops or murmurs. Lungs: clear Abdomen: soft, nontender, nondistended.  Extremities: no cyanosis, clubbing, rash, 2-3+ edema, L BKA Neuro: alert & orientedx3. Affect pleasant    Patient Profile   [name] is a 58 y.o. female with a hx of ITP s/p splenectomy 2022, DM2, hypertension, anemia, BKA, HFpEF, CVA, ICH, PVD, CKD 4, neurogenic bladder, thyroid nodules, poor mobility, and poor compliance who is seen for evaluation of cor pulmonale.   Assessment/Plan   Pulmonary embolism/DVT Cor pulmonale - evidence of RV strain on echo 2/1 in the setting of active PE - R popliteal DVT as well - RHC 2/10: Elevated RA, borderline elevated PCWP, severe mixed PAH/pulmonary venous hypertension, high cardiac output with no evidence of shunt -  Milrinone decreased to 0.125 yesterday d/t high CO. CO-OX 80% this am on 0.125 milrinone, will stop - Failed IV diuresis. CRRT started 02/10. Currently pulling for -100/hr. Tolerating well. CVP 13 today.  - Continue sildenafil 20 TID   HFpEF - History of poor compliance with diuresis - Echo 6/22: EF 60-65, RV nl - Echo this admit EF 55-60%, G1DD, evidence of RV overload, RA severely dilated, mod to sev TR - CRRT as above for volume managmeent - GDMT limited by renal function   AKI on CKD 3  - baseline unknown. Last Cr 1.4 in 2022 - component of cardio-renal in the setting of RV failure - renal US unremarkable - Nephrology following - Poor response to lasix gtt and metolazone on inotrope support.  Now on CRRT.   Elevated troponin - hs-trop 226>216 likely demand ischemia - presented with atypical CP (1 time episode of chest pain that self resolved and lasted a few seconds). CP likely related to PE. No recurrence. - EKG without changes in ST segment   Hypocalcemia - calcium replacement per primary   Hypervolemic hyponatremia - Na 130 - Limit free water intake - Volume management as above  Cough/sore throat - COVID/flu negative - Viral panel negative - Supportive care  Length of Stay: 10  FINCH, LINDSAY N, PA-C  02/15/2023, 11:29 AM  Advanced Heart Failure Team Pager [phone] (M-F; 7a - 5p)  Please contact [facility] for night-coverage after hours (5p -7a ) and weekends on [email or web address]  Patient seen with PA, I formulated the plan and agree with the above note.   She is now on CVVH, net UF 100 cc/hr.  CVP 12 on my read today.  Weight trending down.  BP stable.   She is on milrinone 0.125 with co-ox 81% .  General: NAD Neck: JVP 12 cm, no thyromegaly or thyroid nodule.  Lungs: Clear to auscultation bilaterally with normal respiratory effort. CV: Nondisplaced PMI.  Heart regular S1/S2, +right-sided S3, 2/6 HSM LLSB.  1+ ankle edema.  Abdomen: Soft, nontender, no  hepatosplenomegaly, no distention.  Skin: Intact without lesions or rashes.  Neurologic: Alert and oriented x 3.  Psych: Normal affect. Extremities: No clubbing or cyanosis.  HEENT: Normal.   Patient with RV failure in setting of PE.  Echo this admission with EF 55-60%, moderate RV enlargement, PASP 65, moderate-severe TR. Now with AKI and volume overload, has proceeded to CVVH.  RHC yesterday with elevated R>L heart filling pressures and severe mixed pulmonary arterial/pulmonary venous hypertension + high CO with no evidence for shunt lesion.  Co-ox 81% today, still volume overloaded with CVP 12-13.  - Stop milrinone todaay.  - Continue CVVH, pulling UF net negative 100 cc/hr for now.  - Continue sildenafil 20 tid.   She will continue on heparin gtt for PE.   CRITICAL CARE Performed by: Marca Ancona  Total critical care time: 35 minutes  Critical care time was exclusive of separately billable procedures and treating other patients.  Critical care was necessary to treat or prevent imminent or life-threatening deterioration.  Critical care was time spent personally by me on the following activities: development of treatment plan with patient and/or surrogate as well as nursing, discussions with consultants, evaluation of patient's response to treatment, examination of patient, obtaining history from patient or surrogate, ordering and performing treatments and interventions, ordering and review of laboratory studies, ordering and review of radiographic studies, pulse oximetry and re-evaluation of patient's condition.  Marca Ancona 02/15/2023 12:17 PM

## 2023-02-16 ENCOUNTER — Inpatient Hospital Stay (HOSPITAL_COMMUNITY): Payer: Medicare Other

## 2023-02-16 DIAGNOSIS — D72829 Elevated white blood cell count, unspecified: Secondary | ICD-10-CM

## 2023-02-16 DIAGNOSIS — N179 Acute kidney failure, unspecified: Secondary | ICD-10-CM | POA: Diagnosis not present

## 2023-02-16 DIAGNOSIS — I959 Hypotension, unspecified: Secondary | ICD-10-CM | POA: Diagnosis not present

## 2023-02-16 DIAGNOSIS — I5033 Acute on chronic diastolic (congestive) heart failure: Secondary | ICD-10-CM | POA: Diagnosis not present

## 2023-02-16 DIAGNOSIS — N1832 Chronic kidney disease, stage 3b: Secondary | ICD-10-CM | POA: Diagnosis not present

## 2023-02-16 LAB — CBC
HCT: 24.9 % — ABNORMAL LOW (ref 36.0–46.0)
Hemoglobin: 8.3 g/dL — ABNORMAL LOW (ref 12.0–15.0)
MCH: 29.9 pg (ref 26.0–34.0)
MCHC: 33.3 g/dL (ref 30.0–36.0)
MCV: 89.6 fL (ref 80.0–100.0)
Platelets: 247 10*3/uL (ref 150–400)
RBC: 2.78 MIL/uL — ABNORMAL LOW (ref 3.87–5.11)
RDW: 15.7 % — ABNORMAL HIGH (ref 11.5–15.5)
WBC: 12.5 10*3/uL — ABNORMAL HIGH (ref 4.0–10.5)
nRBC: 0 % (ref 0.0–0.2)

## 2023-02-16 LAB — RENAL FUNCTION PANEL
Albumin: 2.3 g/dL — ABNORMAL LOW (ref 3.5–5.0)
Albumin: 2.4 g/dL — ABNORMAL LOW (ref 3.5–5.0)
Anion gap: 7 (ref 5–15)
Anion gap: 9 (ref 5–15)
BUN: 26 mg/dL — ABNORMAL HIGH (ref 6–20)
BUN: 18 mg/dL (ref 6–20)
CO2: 24 mmol/L (ref 22–32)
CO2: 23 mmol/L (ref 22–32)
Calcium: 7.7 mg/dL — ABNORMAL LOW (ref 8.9–10.3)
Calcium: 8 mg/dL — ABNORMAL LOW (ref 8.9–10.3)
Chloride: 102 mmol/L (ref 98–111)
Chloride: 98 mmol/L (ref 98–111)
Creatinine, Ser: 1.33 mg/dL — ABNORMAL HIGH (ref 0.44–1.00)
Creatinine, Ser: 1.23 mg/dL — ABNORMAL HIGH (ref 0.44–1.00)
GFR, Estimated: 47 mL/min — ABNORMAL LOW (ref >60)
GFR, Estimated: 52 mL/min — ABNORMAL LOW (ref >60)
Glucose, Bld: 107 mg/dL — ABNORMAL HIGH (ref 70–99)
Glucose, Bld: 118 mg/dL — ABNORMAL HIGH (ref 70–99)
Phosphorus: 2.3 mg/dL — ABNORMAL LOW (ref 2.5–4.6)
Phosphorus: 1.9 mg/dL — ABNORMAL LOW (ref 2.5–4.6)
Potassium: 3.4 mmol/L — ABNORMAL LOW (ref 3.5–5.1)
Potassium: 4 mmol/L (ref 3.5–5.1)
Sodium: 133 mmol/L — ABNORMAL LOW (ref 135–145)
Sodium: 130 mmol/L — ABNORMAL LOW (ref 135–145)

## 2023-02-16 LAB — HEPARIN LEVEL (UNFRACTIONATED): Heparin Unfractionated: 0.31 [IU]/mL (ref 0.30–0.70)

## 2023-02-16 LAB — COOXEMETRY PANEL
Carboxyhemoglobin: 2.3 % — ABNORMAL HIGH (ref 0.5–1.5)
Methemoglobin: 1.2 % (ref 0.0–1.5)
O2 Saturation: 80.9 %
Total hemoglobin: 11.6 g/dL — ABNORMAL LOW (ref 12.0–16.0)

## 2023-02-16 LAB — MAGNESIUM: Magnesium: 2.1 mg/dL (ref 1.7–2.4)

## 2023-02-16 MED ORDER — POTASSIUM CHLORIDE CRYS ER 20 MEQ PO TBCR
20.0000 meq | EXTENDED_RELEASE_TABLET | Freq: Once | ORAL | Status: AC
  Administered 2023-02-16: 20 meq via ORAL
  Filled 2023-02-16: qty 1

## 2023-02-16 MED ORDER — PHENOL 1.4 % MT LIQD: 1.0000 | OROMUCOSAL | Status: DC | PRN

## 2023-02-16 MED ORDER — POTASSIUM & SODIUM PHOSPHATES 280-160-250 MG PO PACK
2.0000 | PACK | Freq: Once | ORAL | Status: AC
  Administered 2023-02-16: 2 via ORAL
  Filled 2023-02-16: qty 2

## 2023-02-16 NOTE — Plan of Care (Signed)
  Problem: Education: Goal: Knowledge of General Education information will improve Description: Including pain rating scale, medication(s)/side effects and non-pharmacologic comfort measures Outcome: Progressing   Problem: Activity: Goal: Risk for activity intolerance will decrease Outcome: Progressing   Problem: Elimination: Goal: Will not experience complications related to urinary retention Outcome: Progressing   Problem: Pain Managment: Goal: General experience of comfort will improve and/or be controlled Outcome: Progressing

## 2023-02-16 NOTE — Plan of Care (Signed)
Problem: Education: Goal: Knowledge of General Education information will improve Description: Including pain rating scale, medication(s)/side effects and non-pharmacologic comfort measures Outcome: Progressing   Problem: Clinical Measurements: Goal: Diagnostic test results will improve Outcome: Progressing   Problem: Activity: Goal: Risk for activity intolerance will decrease Outcome: Progressing

## 2023-02-16 NOTE — Progress Notes (Signed)
Occupational Therapy Treatment Patient Details Name: [name] MRN: [id number] DOB: 1965/10/23 Today's Date: 02/16/2023   History of present illness Pt is a 58 y.o. female who presented 02/05/23 with bil lower extremity edema. Admitted with acute on chronic diastolic heart failure. Found to have PE and R popliteal DVT 2/5. Hospitalization complicated by poor response to diuresis protocols with plan to transfer to ICU and start CRRT. PMH: ITP, DM2, CKD stage IIIa, chronic HFpEF, HTN, CVA, ICH, PVD, right BKA, neurogenic bladder/chronic Foley   OT comments  Pt on CRRT. Wanting to pay her bills so her son could deliver them later this evening, but agreeable to working on trunk strength supine<>sit using bed rails and UB exercises with level 2 theraband. Pt demonstrating fair sitting balance long sitting, unsupported in bed for grooming. Pt does not have her prosthesis here at the hospital, unlikely to fit yet due to edema. Patient will benefit from intensive inpatient follow-up therapy, >3 hours/day. Pt with potential to reach modified independence from w/c level with intensive rehab. Instructed pt to continue UB exercise program 2x/day.      If plan is discharge home, recommend the following:  A lot of help with walking and/or transfers;A lot of help with bathing/dressing/bathroom   Equipment Recommendations  Wheelchair (measurements OT);Wheelchair cushion (measurements OT);Other (comment) (pressure relief cushion)    Recommendations for Other Services      Precautions / Restrictions Precautions Precautions: Fall Precaution/Restrictions Comments: R BKA (prosthesis not present--likely not to fit) Restrictions Weight Bearing Restrictions Per Provider Order: No       Mobility Bed Mobility Overal bed mobility: Needs Assistance Bed Mobility: Supine to Sit     Supine to sit: Min assist, Contact guard, Used rails     General bed mobility comments: pt declined EOB or OOB, worked on  pulling to sit with bed rails, gradually decreasing HOB up    Transfers                         Balance     Sitting balance-Leahy Scale: Fair Sitting balance - Comments: sitting unsupported in bed                                   ADL either performed or assessed with clinical judgement   ADL Overall ADL's : Needs assistance/impaired     Grooming: Sitting;Oral care;Supervision/safety Grooming Details (indicate cue type and reason): bed level sitting                                    Extremity/Trunk Assessment              Vision       Perception     Praxis     Communication Communication Communication: No apparent difficulties   Cognition Arousal: Alert Behavior During Therapy: WFL for tasks assessed/performed, Flat affect Cognition: No apparent impairments                                        Cueing      Exercises Exercises: Other exercises General Exercises - Upper Extremity, level 2 theraband  B shoulder flexion and horizontal abduction    Shoulder Instructions  General Comments      Pertinent Vitals/ Pain       Pain Assessment Pain Assessment: No/denies pain  Home Living                                          Prior Functioning/Environment              Frequency  Min 1X/week        Progress Toward Goals  OT Goals(current goals can now be found in the care plan section)  Progress towards OT goals: Progressing toward goals  Acute Rehab OT Goals OT Goal Formulation: With patient Time For Goal Achievement: 02/21/23 Potential to Achieve Goals: Good  Plan      Co-evaluation                 AM-PAC OT "6 Clicks" Daily Activity     Outcome Measure   Help from another person eating meals?: None Help from another person taking care of personal grooming?: A Little Help from another person toileting, which includes using toliet, bedpan, or  urinal?: A Lot Help from another person bathing (including washing, rinsing, drying)?: A Lot Help from another person to put on and taking off regular upper body clothing?: A Little Help from another person to put on and taking off regular lower body clothing?: A Lot 6 Click Score: 16    End of Session    OT Visit Diagnosis: Other abnormalities of gait and mobility (R26.89);Muscle weakness (generalized) (M62.81)   Activity Tolerance Patient tolerated treatment well   Patient Left in bed;with call bell/phone within reach;with nursing/sitter in room   Nurse Communication          Time: 1610-9604 OT Time Calculation (min): 17 min  Charges: OT General Charges $OT Visit: 1 Visit OT Treatments $Therapeutic Activity: 8-22 mins  Berna Spare, OTR/L Acute Rehabilitation Services Office: [phone]   Evern Bio 02/16/2023, 3:21 PM

## 2023-02-16 NOTE — Progress Notes (Signed)
Patient ID: [name], female   DOB: [birth year], 58 y.o.   MRN: [id number] [facility] Progress Note   Assessment/ Plan:   1.  Acute kidney injury on chronic kidney disease stage III: She likely has had progression of her underlying chronic kidney disease at baseline, at this time with superimposed acute kidney injury that is cardiorenal in mechanism (albeit improving? vs dilutional).  Renal function essentially unchanged overnight with unimpressive urine output charted and elevated CVP. - on CRRT since 2/10 - doing well,increase up to 200 mL/ hr 2.  Hypocalcemia: Refractory to earlier efforts at medical management.  PTH level elevated with severe vitamin D deficiency; continue calcitriol and ergocalciferol for treatment respectively. 3.  Acute exacerbation of congestive heart failure: Unimpressive urine output/volume unloading with errors noted in urine collection and erratic weights.  DC furosemide drip/metolazone after starting CRRT. 4.  Hyponatremia: Appears most consistent with inappropriate ADH state in the setting of CHF exacerbation.  Monitor with CRRT/UF and limit free water intake.  Subjective:    Seen in room.  Doing OK with CRRT.  Legs still pretty puffy, BP ok for increased UF.     Objective:   BP (!) 107/47   Pulse 86   Temp 98.1 F (36.7 C) (Oral)   Resp (!) 24   Ht 5\' 1"  (1.549 m)   Wt 101.3 kg   LMP  (LMP Unknown)   SpO2 95%   BMI 42.20 kg/m   Intake/Output Summary (Last 24 hours) at 02/16/2023 1105 Last data filed at 02/16/2023 1000 Gross per 24 hour  Intake 754.01 ml  Output 2788.1 ml  Net -2034.09 ml   Weight change: -1.7 kg  Physical Exam: Gen: Resting uncomfortably in bed. CVS: Pulse regular rhythm, normal rate, S1 and S2 normal Resp: Clear to auscultation bilaterally without rales/rhonchi Abd: Soft, obese, nontender Ext: 1+ right BKA stump edema with 2+ left leg edema extending to just above the knee  Imaging: DG CHEST PORT 1  VIEW Result Date: 02/15/2023 CLINICAL DATA:  Acute kidney injury, line placement for dialysis. EXAM: PORTABLE CHEST 1 VIEW COMPARISON:  02/12/2023 FINDINGS: Right central line tip is at the cavoatrial junction. Cardiomegaly with vascular congestion and diffuse interstitial prominence, likely interstitial edema. No pneumothorax. No acute bony abnormality. IMPRESSION: Right central line tip at the cavoatrial junction.  No pneumothorax. Cardiomegaly, mild pulmonary edema. Electronically Signed   By: Charlett Nose M.D.   On: 02/15/2023 19:20   CARDIAC CATHETERIZATION Result Date: 02/14/2023 1. Elevated RA pressure, borderline elevated PCWP. 2. Severe mixed pulmonary arterial/pulmonary venous hypertension. 3. High cardiac output.  No evidence for shunt lesion (no step up in oxygen saturation from SVC to PA). 4. PAPi preserved. With high CO, will decrease milrinone to 0.125. Will add sildenafil 20 mg tid. She is going to start CVVH.     Labs: BMET Recent Labs  Lab 02/14/23 0343 02/14/23 1514 02/14/23 1518 02/14/23 1737 02/14/23 1738 02/14/23 2309 02/15/23 0450 02/15/23 1605 02/16/23 0441  NA 127*   < > 132* 129* 130* 130* 132* 132* 133*  K 2.9*   < > 3.3* 3.0* 3.0* 3.5 3.5 3.7 3.4*  CL 93*  --   --  98 99 98 101 97* 102  CO2 18*  --   --  18* 17* 21* 22 22 24   GLUCOSE 151*  --   --  119* 121* 127* 107* 156* 107*  BUN 72*  --   --  62* 64* 55* 46* 34*  26*  CREATININE 2.22*  --   --  1.54* 1.58* 1.34* 1.41* 1.36* 1.33*  CALCIUM 7.9*  --   --  7.5* 7.4* 7.6* 7.8* 8.2* 7.7*  PHOS  --   --   --  3.8  --   --  3.1 2.5 2.3*   < > = values in this interval not displayed.   CBC Recent Labs  Lab 02/13/23 0440 02/14/23 0343 02/14/23 1514 02/14/23 1518 02/15/23 0450 02/16/23 0441  WBC 8.7 9.4  --   --  11.7* 12.5*  HGB 8.1* 8.1* 10.9*  10.5* 10.5* 8.3* 8.3*  HCT 23.4* 23.2* 32.0*  31.0* 31.0* 24.7* 24.9*  MCV 89.3 88.2  --   --  87.9 89.6  PLT 221 233  --   --  259 247    Medications:      (feeding supplement) PROSource Plus  30 mL Oral Q1500   calcitRIOL  0.5 mcg Oral Daily   calcium carbonate  1,000 mg of elemental calcium Oral TID WC   Chlorhexidine Gluconate Cloth  6 each Topical Daily   feeding supplement (NEPRO CARB STEADY)  237 mL Oral BID BM   gabapentin  200 mg Oral BID   Gerhardt's butt cream   Topical TID   influenza vac split trivalent PF  0.5 mL Intramuscular Tomorrow-1000   multivitamin  1 tablet Oral QHS   sildenafil  20 mg Oral TID   sodium chloride flush  10-40 mL Intracatheter Q12H   Vitamin D (Ergocalciferol)  50,000 Units Oral Q7 days    Bufford Buttner, MD 02/16/2023, 11:05 AM

## 2023-02-16 NOTE — TOC Progression Note (Signed)
Transition of Care [facility]) - Progression Note    Patient Details  Name: [name] MRN: [id number] Date of Birth: November 30, 1965  Transition of Care [facility]) CM/SW Contact  Nicanor Bake Phone Number: [phone] 02/16/2023, 2:52 PM  Clinical Narrative:  2:39 PM-HF CSW attempted to meet with pt at bedside. Pt was being seen by the nurse. CSW will follow up with pt at a more appropriate time.   TOC will continue following.     Expected Discharge Plan: Home w Home Health Services Barriers to Discharge: Continued Medical Work up  Expected Discharge Plan and Services In-house Referral: Clinical Social Work Discharge Planning Services: CM Consult Post Acute Care Choice: Home Health Living arrangements for the past 2 months: Single Family Home                 DME Arranged: N/A         HH Arranged: PT, OT HH Agency: [facility] Date [facility] Agency Contacted: 02/07/23 Time HH Agency Contacted: 1632 Representative spoke with at [facility] Agency: Kandee Keen   Social Determinants of Health (SDOH) Interventions SDOH Screenings   Food Insecurity: Patient Unable To Answer (02/06/2023)  Housing: Unknown (02/06/2023)  Transportation Needs: No Transportation Needs (02/06/2023)  Utilities: Not At Risk (02/06/2023)  Alcohol Screen: Low Risk  (09/15/2020)  Depression (PHQ2-9): Low Risk  (04/30/2019)  Financial Resource Strain: Low Risk  (09/15/2020)  Tobacco Use: Low Risk  (02/14/2023)    Readmission Risk Interventions     No data to display

## 2023-02-16 NOTE — Consult Note (Signed)
NAME:  [name], MRN:  [id number], DOB:  1965/07/30, LOS: 11 ADMISSION DATE:  02/04/2023, CONSULTATION DATE:  2/10 REFERRING MD:  Wilhemina Cash, CHIEF COMPLAINT:  severe RV failure, renal failure   History of Present Illness:  [name] is a 58 year old woman admitted on 02/05/2023 after presenting with lower extremity edema pleural effusions on chest x-ray.  She had had worsening lower extremity edema for about a week prior to admission.  She admits to generalized weakness, decreased urine output despite using Lasix.  She had 1 episode of left-sided chest pain that resolved.  No shortness of breath or cough at baseline.  Since admission she has had progressive renal failure, been diagnosed with bilateral LL PE, and progressive RHF due to inability to diurese despite milrinone and lasix infusions. She underwent RHC today with high CO and severe PH- mixed venous & arterial with borderline PCWP. HD catheter was placed in cath lab and she was transferred to ICU for CRRT.  She denies complaints currently.   Pertinent  Medical History  CKD 3a DM 2 Stroke Neuropathy Right BKA due to diabetes Hypertension Peripheral neuropathy Neurogenic bladder with chronic Foley  Significant Hospital Events: Including procedures, antibiotic start and stop dates in addition to other pertinent events   2/1 admitted 2/10 RHC, HD catheter placed, ICU for CRRT 2/11: con't CRRT, milrinone 2/12: con't to need volume removal with CRRT   Interim History / Subjective:  NAEON. Continues on CRRT. Increased UF today for volume management. CVP was 14 this morning. Edema slightly better in her lower extremities. She continues to have a dry cough, complaining of sore throat but resp viral panels have been negative.   Objective   Blood pressure (!) 111/45, pulse 70, temperature 99.3 F (37.4 C), temperature source Oral, resp. rate 19, height 5\' 1"  (1.549 m), weight 101.3 kg, SpO2 96%. CVP:  [8 mmHg-24 mmHg] 14 mmHg       Intake/Output Summary (Last 24 hours) at 02/16/2023 7829 Last data filed at 02/16/2023 0800 Gross per 24 hour  Intake 783.07 ml  Output 3010.4 ml  Net -2227.33 ml   Filed Weights   02/14/23 1636 02/15/23 0500 02/16/23 0500  Weight: 103 kg 100 kg 101.3 kg    Examination: General: acute on chronically ill appearing female, laying in bed, no acute distress  HENT: [place]/at, anicteric sclera, perrla, mild periorbital edema Lungs: resp even and unlabored, diminished bases but otherwise clear, 2LNC  Cardiovascular: s1s2, no appreciable murmur, rub, gallop Abdomen: rounded, soft, ntnd Extremities: edema bilaterally - improving , R BKA Neuro: awake, alert, oriented, non focal exam Skin: warm, dry   Resolved Hospital Problem list   Hypokalemia NAGMA Assessment & Plan:  Acute on chronic right heart failure Acute bilateral lower lobe pulmonary emboli with cor pulmonale; R popliteal DVT Severe pulmonary hypertension, mixed arterial and venous - advanced heart failure following, appreciate assistance  - CRRT for volume management - con't sildenafil 20mg  TID  - now off milrinone, stable coox  - con't heparin gtt, will need long-term DOAC  - con't tele monitoring  - GDMT limited by renal function   AKI on CKD 3b Hypervolemic hyponatremia - nephrology following, appreciate CRRT management  - increase UF today to 200cc/hr  - renally dose meds, avoid nephrotoxic meds - strict I/O - renal diet  Headache - fioricet prn  - tylenol prn   Sore throat  Cough  Resp viral panels negative  - chloraseptic spray  - robitussin prn  - cepacol  lozenges   History of diabetes  Diabetic neuropathy  PAD  A1c 5.4. not on insulin  - cbg monitoring   Chronic neuropathic pain -con't reduced / renally dose gabapentin  Diarrhea Question if this was heart failure related. No infectious studies completed. Antibiotics were stopped 2/10. On imodium PTA so ?IBS history? - slowing down  - monitor    Chronic anemia due to chronic disease - transfuse for Hb <7 or hemodynamically significant bleeding  Deconditioning -PT, OT once able to move around more  Best Practice (right click and "Reselect all SmartList Selections" daily)   Diet/type: Regular consistency (see orders) DVT prophylaxis systemic heparin Pressure ulcer(s): N/A GI prophylaxis: N/A Lines: Central line and yes and it is still needed Foley:  N/A Code Status:  full code Last date of multidisciplinary goals of care discussion [patient updated 2/10 ]  Labs   CBC: Recent Labs  Lab 02/12/23 1554 02/13/23 0440 02/14/23 0343 02/14/23 1514 02/14/23 1518 02/15/23 0450 02/16/23 0441  WBC 9.0 8.7 9.4  --   --  11.7* 12.5*  HGB 8.2* 8.1* 8.1* 10.9*  10.5* 10.5* 8.3* 8.3*  HCT 24.6* 23.4* 23.2* 32.0*  31.0* 31.0* 24.7* 24.9*  MCV 90.8 89.3 88.2  --   --  87.9 89.6  PLT 216 221 233  --   --  259 247    Basic Metabolic Panel: Recent Labs  Lab 02/12/23 0655 02/13/23 0440 02/14/23 0343 02/14/23 1514 02/14/23 1737 02/14/23 1738 02/14/23 2309 02/15/23 0450 02/15/23 1605 02/16/23 0441  NA 127* 126* 127*   < > 129* 130* 130* 132* 132* 133*  K 4.2 3.9 2.9*   < > 3.0* 3.0* 3.5 3.5 3.7 3.4*  CL 98 99 93*  --  98 99 98 101 97* 102  CO2 16* 17* 18*  --  18* 17* 21* 22 22 24   GLUCOSE 94 168* 151*  --  119* 121* 127* 107* 156* 107*  BUN 74* 75* 72*  --  62* 64* 55* 46* 34* 26*  CREATININE 2.45* 2.30* 2.22*  --  1.54* 1.58* 1.34* 1.41* 1.36* 1.33*  CALCIUM 7.2* 7.6* 7.9*  --  7.5* 7.4* 7.6* 7.8* 8.2* 7.7*  MG 2.2 2.0 1.9  --   --   --   --  2.1  --  2.1  PHOS  --   --   --   --  3.8  --   --  3.1 2.5 2.3*   < > = values in this interval not displayed.   GFR: Estimated Creatinine Clearance: 51.6 mL/min (A) (by C-G formula based on SCr of 1.33 mg/dL (H)). Recent Labs  Lab 02/10/23 1259 02/11/23 0400 02/13/23 0440 02/14/23 0343 02/15/23 0450 02/16/23 0441  WBC  --    < > 8.7 9.4 11.7* 12.5*  LATICACIDVEN  1.4  --   --   --   --   --    < > = values in this interval not displayed.    Liver Function Tests: Recent Labs  Lab 02/14/23 1737 02/15/23 0450 02/15/23 1605 02/16/23 0441  ALBUMIN 2.2* 2.3* 2.5* 2.3*      Critical care time:  35   [name] [facility] 02/16/23 12:17 PM  Please see [email or web address] for pager details.  From 7A-7P if no response, please call [phone] After hours, please call ELink [phone]

## 2023-02-16 NOTE — Progress Notes (Signed)
PHARMACY - ANTICOAGULATION CONSULT NOTE  Pharmacy Consult for heparin Indication: DVT  No Known Allergies  Patient Measurements: Height: 5\' 1"  (154.9 cm) Weight: 101.3 kg (223 lb 5.2 oz) IBW/kg (Calculated) : 47.8 Heparin Dosing Weight: 85kg  Vital Signs: Temp: 98.1 F (36.7 C) (02/12 0800) Temp Source: Oral (02/12 0800) BP: 107/47 (02/12 1030) Pulse Rate: 86 (02/12 1030)  Labs: Recent Labs    02/14/23 0343 02/14/23 1514 02/14/23 1518 02/14/23 1737 02/15/23 0450 02/15/23 1605 02/16/23 0441  HGB 8.1*   < > 10.5*  --  8.3*  --  8.3*  HCT 23.2*   < > 31.0*  --  24.7*  --  24.9*  PLT 233  --   --   --  259  --  247  APTT  --   --   --   --  144*  --   --   HEPARINUNFRC 0.45  --   --   --  0.44  --  0.31  CREATININE 2.22*  --   --    < > 1.41* 1.36* 1.33*   < > = values in this interval not displayed.    Estimated Creatinine Clearance: 51.6 mL/min (A) (by C-G formula based on SCr of 1.33 mg/dL (H)).  Assessment: 58 yo female admitted for CHF and doppler positive for DVT.  VQ shows perfusion defects both lower lungs consistent with presence of PE.  Pharmacy dosing heparin.   Heparin paused for RHC and HD line placement 2/10 pm >resumed Heparin drip 1750 uts/hr with heparin level 0.31 at goal CBC low but stable  On CRRT   No overt bleeding or complications noted.  Goal of Therapy:  Heparin level 0.3-0.7 units/ml Monitor platelets by anticoagulation protocol: Yes   Plan:  Continue heparin at 1750 units/hr Check heparin level, CBC in am   Reece Leader, Colon Flattery, [facility] Clinical Pharmacist  02/16/2023 11:24 AM   [facility] pharmacy phone numbers are listed on [email or web address]

## 2023-02-16 NOTE — Progress Notes (Addendum)
Advanced Heart Failure Rounding Note  PCP-Cardiologist: Parke Poisson, MD  HF Cardiologist: Dr. Elwyn Lade   Chief Complaint: RV failure  Subjective:    CO-OX 81% off milrinone  Tolerating CRRT, nephrology increasing to pull for negative 200 mL/hr.  CVP 14  Still fatigued. Cough improving. Leg edema slightly better.     Objective:   Weight Range: 101.3 kg Body mass index is 42.2 kg/m.   Vital Signs:   Temp:  [98.1 F (36.7 C)-100 F (37.8 C)] 98.1 F (36.7 C) (02/12 0800) Pulse Rate:  [69-115] 86 (02/12 1030) Resp:  [14-30] 24 (02/12 1030) BP: (80-163)/(41-94) 107/47 (02/12 1030) SpO2:  [88 %-100 %] 95 % (02/12 1030) Weight:  [101.3 kg] 101.3 kg (02/12 0500) Last BM Date : 02/15/23  Weight change: Filed Weights   02/14/23 1636 02/15/23 0500 02/16/23 0500  Weight: 103 kg 100 kg 101.3 kg    Intake/Output:   Intake/Output Summary (Last 24 hours) at 02/16/2023 1122 Last data filed at 02/16/2023 1000 Gross per 24 hour  Intake 754.01 ml  Output 2788.1 ml  Net -2034.09 ml      Physical Exam  CVP 14 General:  Appears fatigued, chronically ill. Neck: JVP to jaw, R internal jugular HD cath Cor: PMI nondisplaced. Regular rate & rhythm. No rubs, gallops or murmurs. Lungs: clear Abdomen: soft, nontender, nondistended.  Extremities: no cyanosis, clubbing, rash, 2 + edema, R BKA Neuro: alert & orientedx3. Affect pleasant   Patient Profile   [name] is a 58 y.o. female with a hx of ITP s/p splenectomy 2022, DM2, hypertension, anemia, R BKA, HFpEF, CVA, ICH, PVD, CKD 4, neurogenic bladder, thyroid nodules, poor mobility, and poor compliance who is seen for evaluation of cor pulmonale.   Assessment/Plan   Pulmonary embolism/DVT Cor pulmonale - evidence of RV strain on echo 2/1 in the setting of active PE - R popliteal DVT as well - Heparin gtt for PE, eventual DOAC - RHC 2/10: Elevated RA, borderline elevated PCWP, severe mixed PAH/pulmonary  venous hypertension, high cardiac output with no evidence of shunt - CO-OX stable off milrinone.  - Failed IV diuresis. CRRT started 02/10. Currently pulling for -100/hr, tolerating well. Increasing to -200/hr. CVP 14. - Continue sildenafil 20 TID   HFpEF - History of poor compliance with diuresis - Echo 6/22: EF 60-65, RV nl - Echo this admit EF 55-60%, G1DD, evidence of RV overload, RA severely dilated, mod to sev TR - CRRT as above for volume managmeent - GDMT limited by renal function   AKI on CKD 3  - baseline unknown. Last Cr 1.4 in 2022 - component of cardio-renal in the setting of RV failure - renal US unremarkable - Nephrology following - Poor response to lasix gtt and metolazone on inotrope support.  Now on CRRT.   Elevated troponin - hs-trop 226>216 likely demand ischemia - presented with atypical CP (1 time episode of chest pain that self resolved and lasted a few seconds). CP likely related to PE. No recurrence. - EKG without changes in ST segment   Hypocalcemia - calcium replacement per primary   Hypervolemic hyponatremia - Na improved to 133 - Limit free water intake - Volume management as above  Cough/sore throat - T max 100F overnight. WBCs 8>>12.5K over last few days - COVID/flu negative - Respiratory panel negative - Symptoms improving   CRITICAL CARE Performed by: Anna Genre N   Total critical care time: 20 minutes  Critical care time was exclusive  of separately billable procedures and treating other patients.  Critical care was necessary to treat or prevent imminent or life-threatening deterioration.  Critical care was time spent personally by me on the following activities: development of treatment plan with patient and/or surrogate as well as nursing, discussions with consultants, evaluation of patient's response to treatment, examination of patient, obtaining history from patient or surrogate, ordering and performing treatments and  interventions, ordering and review of laboratory studies, ordering and review of radiographic studies, pulse oximetry and re-evaluation of patient's condition.    Length of Stay: 11  FINCH, LINDSAY N, PA-C  02/16/2023, 11:22 AM  Advanced Heart Failure Team Pager [phone] (M-F; 7a - 5p)  Please contact [facility] for night-coverage after hours (5p -7a ) and weekends on [email or web address]  Patient seen with PA, agree with the above note.   Started on CVVH, pulling net negative UF 100 cc/hr overnight.  I/Os net negative 2249.  BP stable. Co-ox 81%.  CVP 14.   No complaints.   General: NAD Neck: JVP 14-16 cm, no thyromegaly or thyroid nodule.  Lungs: Clear to auscultation bilaterally with normal respiratory effort. CV: Nondisplaced PMI.  Heart regular S1/S2, no S3/S4, no murmur.  1+ edema 1/2 to knees on left.   Abdomen: Soft, nontender, no hepatosplenomegaly, no distention.  Skin: Intact without lesions or rashes.  Neurologic: Alert and oriented x 3.  Psych: Normal affect. Extremities: Right BKA HEENT: Normal.   Patient with RV failure in setting of PE.  Echo this admission with EF 55-60%, moderate RV enlargement, PASP 65, moderate-severe TR. Now with AKI and volume overload, has proceeded to CVVH.  RHC with elevated R>L heart filling pressures and severe mixed pulmonary arterial/pulmonary venous hypertension + high CO with no evidence for shunt lesion.  Co-ox 81% today, still volume overloaded with CVP 14.  - Continue CVVH, pulling UF net negative 100 cc/hr, can increase as tolerated up to 200 cc/hr.  - Continue sildenafil 20 tid.    She will continue on heparin gtt for PE.   CRITICAL CARE Performed by: Marca Ancona  Total critical care time: 35 minutes  Critical care time was exclusive of separately billable procedures and treating other patients.  Critical care was necessary to treat or prevent imminent or life-threatening deterioration.  Critical care was time spent  personally by me on the following activities: development of treatment plan with patient and/or surrogate as well as nursing, discussions with consultants, evaluation of patient's response to treatment, examination of patient, obtaining history from patient or surrogate, ordering and performing treatments and interventions, ordering and review of laboratory studies, ordering and review of radiographic studies, pulse oximetry and re-evaluation of patient's condition.  Marca Ancona 02/16/2023 12:16 PM

## 2023-02-17 DIAGNOSIS — J9601 Acute respiratory failure with hypoxia: Secondary | ICD-10-CM | POA: Diagnosis not present

## 2023-02-17 DIAGNOSIS — N179 Acute kidney failure, unspecified: Secondary | ICD-10-CM | POA: Diagnosis not present

## 2023-02-17 DIAGNOSIS — I5033 Acute on chronic diastolic (congestive) heart failure: Secondary | ICD-10-CM | POA: Diagnosis not present

## 2023-02-17 DIAGNOSIS — J810 Acute pulmonary edema: Secondary | ICD-10-CM

## 2023-02-17 LAB — RENAL FUNCTION PANEL
Albumin: 2.3 g/dL — ABNORMAL LOW (ref 3.5–5.0)
Albumin: 2.4 g/dL — ABNORMAL LOW (ref 3.5–5.0)
Anion gap: 11 (ref 5–15)
Anion gap: 6 (ref 5–15)
BUN: 14 mg/dL (ref 6–20)
BUN: 13 mg/dL (ref 6–20)
CO2: 24 mmol/L (ref 22–32)
CO2: 25 mmol/L (ref 22–32)
Calcium: 8.5 mg/dL — ABNORMAL LOW (ref 8.9–10.3)
Calcium: 8.1 mg/dL — ABNORMAL LOW (ref 8.9–10.3)
Chloride: 97 mmol/L — ABNORMAL LOW (ref 98–111)
Chloride: 99 mmol/L (ref 98–111)
Creatinine, Ser: 1.25 mg/dL — ABNORMAL HIGH (ref 0.44–1.00)
Creatinine, Ser: 1.38 mg/dL — ABNORMAL HIGH (ref 0.44–1.00)
GFR, Estimated: 51 mL/min — ABNORMAL LOW (ref >60)
GFR, Estimated: 45 mL/min — ABNORMAL LOW (ref >60)
Glucose, Bld: 115 mg/dL — ABNORMAL HIGH (ref 70–99)
Glucose, Bld: 129 mg/dL — ABNORMAL HIGH (ref 70–99)
Phosphorus: 2.1 mg/dL — ABNORMAL LOW (ref 2.5–4.6)
Phosphorus: 1.9 mg/dL — ABNORMAL LOW (ref 2.5–4.6)
Potassium: 3.8 mmol/L (ref 3.5–5.1)
Potassium: 4.1 mmol/L (ref 3.5–5.1)
Sodium: 132 mmol/L — ABNORMAL LOW (ref 135–145)
Sodium: 130 mmol/L — ABNORMAL LOW (ref 135–145)

## 2023-02-17 LAB — CBC
HCT: 26 % — ABNORMAL LOW (ref 36.0–46.0)
Hemoglobin: 8.7 g/dL — ABNORMAL LOW (ref 12.0–15.0)
MCH: 29.9 pg (ref 26.0–34.0)
MCHC: 33.5 g/dL (ref 30.0–36.0)
MCV: 89.3 fL (ref 80.0–100.0)
Platelets: 272 10*3/uL (ref 150–400)
RBC: 2.91 MIL/uL — ABNORMAL LOW (ref 3.87–5.11)
RDW: 16.1 % — ABNORMAL HIGH (ref 11.5–15.5)
WBC: 11.7 10*3/uL — ABNORMAL HIGH (ref 4.0–10.5)
nRBC: 0 % (ref 0.0–0.2)

## 2023-02-17 LAB — COOXEMETRY PANEL
Carboxyhemoglobin: 2.3 % — ABNORMAL HIGH (ref 0.5–1.5)
Methemoglobin: 0.8 % (ref 0.0–1.5)
O2 Saturation: 79.7 %
Total hemoglobin: 9.3 g/dL — ABNORMAL LOW (ref 12.0–16.0)

## 2023-02-17 LAB — HEPARIN LEVEL (UNFRACTIONATED): Heparin Unfractionated: 0.5 [IU]/mL (ref 0.30–0.70)

## 2023-02-17 LAB — TSH: TSH: <0.01 u[IU]/mL — ABNORMAL LOW (ref 0.350–4.500)

## 2023-02-17 LAB — MAGNESIUM: Magnesium: 2.2 mg/dL (ref 1.7–2.4)

## 2023-02-17 MED ORDER — ACETAMINOPHEN-CAFFEINE 500-65 MG PO TABS
1.0000 | ORAL_TABLET | Freq: Once | ORAL | Status: AC
  Administered 2023-02-17: 1 via ORAL
  Filled 2023-02-17: qty 1

## 2023-02-17 MED ORDER — ACETAMINOPHEN-CAFFEINE 500-65 MG PO TABS: 1.0000 | ORAL_TABLET | Freq: Three times a day (TID) | ORAL | Status: DC | PRN

## 2023-02-17 MED ORDER — BUTALBITAL-APAP-CAFFEINE 50-325-40 MG PO TABS: 1.0000 | ORAL_TABLET | Freq: Three times a day (TID) | ORAL | Status: DC | PRN

## 2023-02-17 MED ORDER — ACETAMINOPHEN-CAFFEINE 500-65 MG PO TABS
1.0000 | ORAL_TABLET | Freq: Three times a day (TID) | ORAL | Status: AC | PRN
  Administered 2023-02-17 – 2023-02-18 (×3): 1 via ORAL
  Filled 2023-02-17 (×4): qty 1

## 2023-02-17 MED ORDER — NOREPINEPHRINE 4 MG/250ML-% IV SOLN
0.0000 ug/min | INTRAVENOUS | Status: DC
  Administered 2023-02-17: 2 ug/min via INTRAVENOUS
  Filled 2023-02-17: qty 250

## 2023-02-17 NOTE — Progress Notes (Signed)
Physical Therapy Treatment Patient Details Name: [name] MRN: [id number] DOB: 05/21/1965 Today's Date: 02/17/2023   History of Present Illness Pt is a 58 y.o. female who presented 02/05/23 with bil lower extremity edema. Admitted with acute on chronic diastolic heart failure. Found to have PE and R popliteal DVT 2/5. Hospitalization complicated by poor response to diuresis protocols. 2/11 CRRT started. PMH: ITP, DM2, CKD stage IIIa, chronic HFpEF, HTN, CVA, ICH, PVD, right BKA, neurogenic bladder/chronic Foley    PT Comments  Pt is continuing to progress towards goals. Currently pt is CGA for rolling and supine to sitting. Pt was able to scoot laterally from EOB to recliner at CGA/Min A with assist stabilizing recliner. Pt demonstrates good UE support and requires intermittent verbal cues for safety. Due to pt current functional status, home set up and available assistance at home recommending skilled physical therapy services > 3 hours/day in order to address strength, balance and functional mobility to decrease risk for falls, injury, immobility, skin break down and re-hospitalization.     If plan is discharge home, recommend the following: Assistance with cooking/housework;Assist for transportation;Help with stairs or ramp for entrance;Supervision due to cognitive status;A lot of help with walking and/or transfers   Can travel by private vehicle     No  Equipment Recommendations  None recommended by PT       Precautions / Restrictions Precautions Precautions: Fall Precaution/Restrictions Comments: R BKA (prosthesis not present--likely not to fit), CRRT Restrictions Weight Bearing Restrictions Per Provider Order: No     Mobility  Bed Mobility Overal bed mobility: Needs Assistance Bed Mobility: Supine to Sit Rolling: Used rails, Contact guard assist Sidelying to sit: Contact guard assist     Transfers Overall transfer level: Needs assistance Equipment used:  None Transfers: Bed to chair/wheelchair/BSC            Lateral/Scoot Transfers: Contact guard assist, Min assist General transfer comment: Pt assisted with scooting laterally with CGA and directions for safety. Min A to block recliner to prevent sliding.    Ambulation/Gait   General Gait Details: unable at baseline     Balance Overall balance assessment: Needs assistance Sitting-balance support: No upper extremity supported, Feet supported Sitting balance-Leahy Scale: Fair Sitting balance - Comments: sitting unsupported in bed, CGA for dynamic activities      Communication Communication Communication: No apparent difficulties  Cognition Arousal: Alert Behavior During Therapy: WFL for tasks assessed/performed, Flat affect   PT - Cognitive impairments: No apparent impairments   Following commands: Intact            General Comments General comments (skin integrity, edema, etc.): Continues with edema to abodmen and bil LE with skin discoloration. VItals did well on room air.      Pertinent Vitals/Pain Pain Assessment Faces Pain Scale: Hurts a little bit Breathing: normal Facial Expression: Relaxed, neutral Body Movements: Absence of movements Muscle Tension: Relaxed Compliance with ventilator (intubated pts.): N/A Vocalization (extubated pts.): Talking in normal tone or no sound CPOT Total: 0 Pain Location: headache Pain Descriptors / Indicators: Discomfort, Aching Pain Intervention(s): Monitored during session, Limited activity within patient's tolerance     PT Goals (current goals can now be found in the care plan section) Acute Rehab PT Goals Patient Stated Goal: to get stronger PT Goal Formulation: With patient Time For Goal Achievement: 02/20/23 Potential to Achieve Goals: Good Progress towards PT goals: Progressing toward goals    Frequency    Min 1X/week  PT Plan  Continue with current POC        AM-PAC PT "6 Clicks" Mobility    Outcome Measure  Help needed turning from your back to your side while in a flat bed without using bedrails?: A Little Help needed moving from lying on your back to sitting on the side of a flat bed without using bedrails?: A Little Help needed moving to and from a bed to a chair (including a wheelchair)?: A Little Help needed standing up from a chair using your arms (e.g., wheelchair or bedside chair)?: A Lot Help needed to walk in hospital room?: Total Help needed climbing 3-5 steps with a railing? : Total 6 Click Score: 13    End of Session Equipment Utilized During Treatment: Gait belt Activity Tolerance: Patient tolerated treatment well Patient left: with call bell/phone within reach;in bed;in chair Nurse Communication: Mobility status PT Visit Diagnosis: Unsteadiness on feet (R26.81);Muscle weakness (generalized) (M62.81);Difficulty in walking, not elsewhere classified (R26.2)     Time: 4098-1191 PT Time Calculation (min) (ACUTE ONLY): 23 min  Charges:    $Therapeutic Activity: 23-37 mins PT General Charges $$ ACUTE PT VISIT: 1 Visit                     Harrel Carina, DPT, CLT  Acute Rehabilitation Services Office: [phone] (Secure chat preferred)'   Claudia Desanctis 02/17/2023, 3:32 PM

## 2023-02-17 NOTE — Progress Notes (Signed)
PHARMACY - ANTICOAGULATION CONSULT NOTE  Pharmacy Consult for heparin Indication: DVT  No Known Allergies  Patient Measurements: Height: 5\' 1"  (154.9 cm) Weight: 93.6 kg (206 lb 5.6 oz) IBW/kg (Calculated) : 47.8 Heparin Dosing Weight: 85kg  Vital Signs: Temp: 98.1 F (36.7 C) (02/13 0800) Temp Source: Oral (02/13 0800) BP: 105/43 (02/13 0900) Pulse Rate: 88 (02/13 1000)  Labs: Recent Labs    02/15/23 0450 02/15/23 1605 02/16/23 0441 02/16/23 1632 02/17/23 0409  HGB 8.3*  --  8.3*  --  8.7*  HCT 24.7*  --  24.9*  --  26.0*  PLT 259  --  247  --  272  APTT 144*  --   --   --   --   HEPARINUNFRC 0.44  --  0.31  --  0.50  CREATININE 1.41*   < > 1.33* 1.23* 1.25*   < > = values in this interval not displayed.    Estimated Creatinine Clearance: 52.4 mL/min (A) (by C-G formula based on SCr of 1.25 mg/dL (H)).  Assessment: 58 yo Wolf admitted for CHF and doppler positive for DVT.  VQ shows perfusion defects both lower lungs consistent with presence of PE.  Pharmacy dosing heparin.   Heparin level therapeutic at 0.5, CBC ok.  Goal of Therapy:  Heparin level 0.3-0.7 units/ml Monitor platelets by anticoagulation protocol: Yes   Plan:  Continue heparin at 1750 units/hr Check heparin level, CBC in am   Fredonia Highland, PharmD, BCPS, [facility] Clinical Pharmacist [phone] Please check AMION for all [facility] Pharmacy numbers 02/17/2023

## 2023-02-17 NOTE — Consult Note (Signed)
NAME:  [name], MRN:  [id number], DOB:  1965/01/11, LOS: 12 ADMISSION DATE:  02/04/2023, CONSULTATION DATE:  2/10 REFERRING MD:  Wilhemina Cash, CHIEF COMPLAINT:  severe RV failure, renal failure   History of Present Illness:  [name] is a 58 year old woman admitted on 02/05/2023 after presenting with lower extremity edema pleural effusions on chest x-ray.  She had had worsening lower extremity edema for about a week prior to admission.  She admits to generalized weakness, decreased urine output despite using Lasix.  She had 1 episode of left-sided chest pain that resolved.  No shortness of breath or cough at baseline.  Since admission she has had progressive renal failure, been diagnosed with bilateral LL PE, and progressive RHF due to inability to diurese despite milrinone and lasix infusions. She underwent RHC today with high CO and severe PH- mixed venous & arterial with borderline PCWP. HD catheter was placed in cath lab and she was transferred to ICU for CRRT.  She denies complaints currently.   Pertinent  Medical History  CKD 3a DM 2 Stroke Neuropathy Right BKA due to diabetes Hypertension Peripheral neuropathy Neurogenic bladder with chronic Foley  Significant Hospital Events: Including procedures, antibiotic start and stop dates in addition to other pertinent events   2/1 admitted 2/10 RHC, HD catheter placed, ICU for CRRT 2/11: con't CRRT, milrinone 2/12: con't to need volume removal with CRRT  2/13: Continue CRRT  Interim History / Subjective:  NAEON. Continues on CRRT.  UF increased yesterday to help with volume management.  CVP was still 15 this morning.  Her edema is slightly better in her lower extremities.  Again continues to have a headache, dry cough and complaining of sore throat without other infectious symptoms.   Objective   Blood pressure (!) 121/56, pulse 79, temperature 98.2 F (36.8 C), temperature source Oral, resp. rate 20, height 5\' 1"  (1.549 m),  weight 93.6 kg, SpO2 96%. CVP:  [9 mmHg-15 mmHg] 12 mmHg      Intake/Output Summary (Last 24 hours) at 02/17/2023 1402 Last data filed at 02/17/2023 1300 Gross per 24 hour  Intake 1196.38 ml  Output 4609 ml  Net -3412.62 ml   Filed Weights   02/15/23 0500 02/16/23 0500 02/17/23 0630  Weight: 100 kg 101.3 kg 93.6 kg    Examination: General: acute on chronically ill appearing female, sitting in bed eating breakfast HENT: /at, anicteric sclera, perrla, mild periorbital edema, room air Lungs: resp even and unlabored, diminished bases but otherwise clear, room air Cardiovascular: s1s2, no appreciable murmur, rub, gallop Abdomen: rounded, soft, ntnd Extremities: edema bilaterally - improving , R BKA Neuro: awake, alert, oriented, non focal exam Skin: warm, dry   Resolved Hospital Problem list   Hypokalemia NAGMA Diarrhea Assessment & Plan:  Acute on chronic right heart failure Acute bilateral lower lobe pulmonary emboli with cor pulmonale; R popliteal DVT Severe pulmonary hypertension, mixed arterial and venous - advanced heart failure following, appreciate assistance  -On and off low-dose norepinephrine due to her diastolic pressures.  SBP goal greater than 90 rather than maps for heart failure. -Consider adding midodrine if needed - CRRT for volume management, increasing UF - con't sildenafil 20mg  TID  - now off milrinone, stable coox  - con't heparin gtt, will need long-term DOAC  - con't tele monitoring  - GDMT limited by renal function   AKI on CKD 3b Hypervolemic hyponatremia - nephrology following, appreciate CRRT management  - increase UF today to 200cc/hr  - renally dose  meds, avoid nephrotoxic meds - strict I/O - renal diet  Headache -Change Fioricet to Excedrin to see if this helps - tylenol prn   Sore throat  Cough  Resp viral panels negative  - chloraseptic spray  - robitussin prn  - cepacol lozenges   History of diabetes  Diabetic neuropathy   PAD  A1c 5.4. not on insulin  - cbg monitoring   Chronic neuropathic pain -con't reduced / renally dose gabapentin  Chronic anemia due to chronic disease - transfuse for Hb <7 or hemodynamically significant bleeding  Deconditioning -PT, OT once able to move around more  Best Practice (right click and "Reselect all SmartList Selections" daily)   Diet/type: Regular consistency (see orders) DVT prophylaxis systemic heparin Pressure ulcer(s): present on admission stage I, left buttock GI prophylaxis: N/A Lines: Central line, Dialysis Catheter, and yes and it is still needed Foley:  N/A Code Status:  full code Last date of multidisciplinary goals of care discussion [patient updated 2/13 ]  Labs   CBC: Recent Labs  Lab 02/13/23 0440 02/14/23 0343 02/14/23 1514 02/14/23 1518 02/15/23 0450 02/16/23 0441 02/17/23 0409  WBC 8.7 9.4  --   --  11.7* 12.5* 11.7*  HGB 8.1* 8.1* 10.9*  10.5* 10.5* 8.3* 8.3* 8.7*  HCT 23.4* 23.2* 32.0*  31.0* 31.0* 24.7* 24.9* 26.0*  MCV 89.3 88.2  --   --  87.9 89.6 89.3  PLT 221 233  --   --  259 247 272    Basic Metabolic Panel: Recent Labs  Lab 02/13/23 0440 02/14/23 0343 02/14/23 1514 02/15/23 0450 02/15/23 1605 02/16/23 0441 02/16/23 1632 02/17/23 0409  NA 126* 127*   < > 132* 132* 133* 130* 132*  K 3.9 2.9*   < > 3.5 3.7 3.4* 4.0 3.8  CL 99 93*   < > 101 97* 102 98 97*  CO2 17* 18*   < > 22 22 24 23 24   GLUCOSE 168* 151*   < > 107* 156* 107* 118* 115*  BUN 75* 72*   < > 46* 34* 26* 18 14  CREATININE 2.30* 2.22*   < > 1.41* 1.36* 1.33* 1.23* 1.25*  CALCIUM 7.6* 7.9*   < > 7.8* 8.2* 7.7* 8.0* 8.5*  MG 2.0 1.9  --  2.1  --  2.1  --  2.2  PHOS  --   --    < > 3.1 2.5 2.3* 1.9* 2.1*   < > = values in this interval not displayed.   GFR: Estimated Creatinine Clearance: 52.4 mL/min (A) (by C-G formula based on SCr of 1.25 mg/dL (H)). Recent Labs  Lab 02/14/23 0343 02/15/23 0450 02/16/23 0441 02/17/23 0409  WBC 9.4 11.7*  12.5* 11.7*    Liver Function Tests: Recent Labs  Lab 02/15/23 0450 02/15/23 1605 02/16/23 0441 02/16/23 1632 02/17/23 0409  ALBUMIN 2.3* 2.5* 2.3* 2.4* 2.3*      Critical care time:  35   Lenard Galloway [facility] 02/17/23 2:02 PM  Please see [email or web address] for pager details.  From 7A-7P if no response, please call [phone] After hours, please call ELink [phone]

## 2023-02-17 NOTE — Progress Notes (Signed)
Patient ID: [name], female   DOB: December 23, 1965, 58 y.o.   MRN: [id number] [facility] Progress Note   Assessment/ Plan:   1.  Acute kidney injury on chronic kidney disease stage III: She likely has had progression of her underlying chronic kidney disease at baseline, at this time with superimposed acute kidney injury that is cardiorenal in mechanism (albeit improving? vs dilutional).  Renal function essentially unchanged overnight with unimpressive urine output charted and elevated CVP. - on CRRT since 2/10 - doing well,increase up to 200 mL/ hr 2.  Hypocalcemia: Refractory to earlier efforts at medical management.  PTH level elevated with severe vitamin D deficiency; continue calcitriol and ergocalciferol for treatment respectively. 3.  Acute exacerbation of congestive heart failure: Unimpressive urine output/volume unloading with errors noted in urine collection and erratic weights.  DC furosemide drip/metolazone after starting CRRT. 4.  Hyponatremia: Appears most consistent with inappropriate ADH state in the setting of CHF exacerbation.  Monitor with CRRT/UF and limit free water intake.  Subjective:    Doing a little better with volume status- still a long way to go    Objective:   BP (!) 105/43   Pulse 88   Temp 98.1 F (36.7 C) (Oral)   Resp 20   Ht 5\' 1"  (1.549 m)   Wt 93.6 kg   LMP  (LMP Unknown)   SpO2 97%   BMI 38.99 kg/m   Intake/Output Summary (Last 24 hours) at 02/17/2023 1029 Last data filed at 02/17/2023 1000 Gross per 24 hour  Intake 1347.83 ml  Output 4630 ml  Net -3282.17 ml   Weight change: -7.7 kg  Physical Exam: Gen: Resting uncomfortably in bed. CVS: Pulse regular rhythm, normal rate, S1 and S2 normal Resp: Clear to auscultation bilaterally without rales/rhonchi Abd: Soft, obese, nontender Ext: 1+ right BKA stump edema with 2+ left leg edema extending to just above the knee  Imaging: DG CHEST PORT 1 VIEW Result Date:  02/16/2023 CLINICAL DATA:  CHF.  Lower extremity swelling. EXAM: PORTABLE CHEST 1 VIEW COMPARISON:  Radiographs yesterday. FINDINGS: Right upper extremity PICC and right internal jugular central line remain in place. Cardiomegaly is unchanged. Similar pulmonary edema. There are small bilateral pleural effusions. No focal airspace disease. No pneumothorax. IMPRESSION: Cardiomegaly with pulmonary edema and small bilateral pleural effusions. Electronically Signed   By: Narda Rutherford M.D.   On: 02/16/2023 15:48   DG CHEST PORT 1 VIEW Result Date: 02/15/2023 CLINICAL DATA:  Acute kidney injury, line placement for dialysis. EXAM: PORTABLE CHEST 1 VIEW COMPARISON:  02/12/2023 FINDINGS: Right central line tip is at the cavoatrial junction. Cardiomegaly with vascular congestion and diffuse interstitial prominence, likely interstitial edema. No pneumothorax. No acute bony abnormality. IMPRESSION: Right central line tip at the cavoatrial junction.  No pneumothorax. Cardiomegaly, mild pulmonary edema. Electronically Signed   By: Charlett Nose M.D.   On: 02/15/2023 19:20     Labs: BMET Recent Labs  Lab 02/14/23 1737 02/14/23 1738 02/14/23 2309 02/15/23 0450 02/15/23 1605 02/16/23 0441 02/16/23 1632 02/17/23 0409  NA 129* 130* 130* 132* 132* 133* 130* 132*  K 3.0* 3.0* 3.5 3.5 3.7 3.4* 4.0 3.8  CL 98 99 98 101 97* 102 98 97*  CO2 18* 17* 21* 22 22 24 23 24   GLUCOSE 119* 121* 127* 107* 156* 107* 118* 115*  BUN 62* 64* 55* 46* 34* 26* 18 14  CREATININE 1.54* 1.58* 1.34* 1.41* 1.36* 1.33* 1.23* 1.25*  CALCIUM 7.5* 7.4* 7.6* 7.8* 8.2*  7.7* 8.0* 8.5*  PHOS 3.8  --   --  3.1 2.5 2.3* 1.9* 2.1*   CBC Recent Labs  Lab 02/14/23 0343 02/14/23 1514 02/14/23 1518 02/15/23 0450 02/16/23 0441 02/17/23 0409  WBC 9.4  --   --  11.7* 12.5* 11.7*  HGB 8.1*   < > 10.5* 8.3* 8.3* 8.7*  HCT 23.2*   < > 31.0* 24.7* 24.9* 26.0*  MCV 88.2  --   --  87.9 89.6 89.3  PLT 233  --   --  259 247 272   < > = values  in this interval not displayed.    Medications:     (feeding supplement) PROSource Plus  30 mL Oral Q1500   acetaminophen-caffeine  1 tablet Oral Once   calcitRIOL  0.5 mcg Oral Daily   calcium carbonate  1,000 mg of elemental calcium Oral TID WC   Chlorhexidine Gluconate Cloth  6 each Topical Daily   feeding supplement (NEPRO CARB STEADY)  237 mL Oral BID BM   gabapentin  200 mg Oral BID   Gerhardt's butt cream   Topical TID   influenza vac split trivalent PF  0.5 mL Intramuscular Tomorrow-1000   multivitamin  1 tablet Oral QHS   sildenafil  20 mg Oral TID   sodium chloride flush  10-40 mL Intracatheter Q12H   Vitamin D (Ergocalciferol)  50,000 Units Oral Q7 days    Bufford Buttner, MD 02/17/2023, 10:29 AM

## 2023-02-17 NOTE — Plan of Care (Signed)
  Problem: Education: Goal: Knowledge of General Education information will improve Description: Including pain rating scale, medication(s)/side effects and non-pharmacologic comfort measures Outcome: Progressing   Problem: Clinical Measurements: Goal: Respiratory complications will improve Outcome: Progressing   Problem: Coping: Goal: Level of anxiety will decrease Outcome: Progressing   Problem: Elimination: Goal: Will not experience complications related to bowel motility Outcome: Progressing

## 2023-02-17 NOTE — Progress Notes (Signed)
eLink Physician-Brief Progress Note Patient Name: [name] DOB: 30-Nov-1965 MRN: [id number]   Date of Service  02/17/2023  HPI/Events of Note  [name] is a 58 y.o. female with a hx of ITP s/p splenectomy 2022, DM2, hypertension, anemia, R BKA, HFpEF, CVA, ICH, PVD, CKD 4, neurogenic bladder, thyroid nodules, poor mobility, and poor compliance who is seen for evaluation of cor pulmonale.   Blood pressure has been softer with maps in the low 50s.  Increase in sildenafil today.  Ongoing negative fluid status.  eICU Interventions  Will initiate norepinephrine.     Intervention Category Intermediate Interventions: Hypotension - evaluation and management  [name] 02/17/2023, 2:40 AM

## 2023-02-17 NOTE — Progress Notes (Addendum)
Advanced Heart Failure Rounding Note  PCP-Cardiologist: Parke Poisson, MD  HF Cardiologist: Dr. Elwyn Lade   Chief Complaint: RV failure  Subjective:    CO-OX 80% off milrinone.   CVP 15  4.7 volume removed with CRRT last 24 hrs, -3.2L for the day.   Off and NE overnight to maintain MAP > 65. Her diastolic pressures have been low.  Denies dyspnea. Leg edema slowly improving. Cough better.   Objective:   Weight Range: 93.6 kg Body mass index is 38.99 kg/m.   Vital Signs:   Temp:  [98.1 F (36.7 C)-98.3 F (36.8 C)] 98.1 F (36.7 C) (02/13 0800) Pulse Rate:  [71-95] 88 (02/13 1000) Resp:  [11-34] 20 (02/13 1000) BP: (85-144)/(39-98) 124/50 (02/13 1000) SpO2:  [90 %-100 %] 97 % (02/13 1000) Weight:  [93.6 kg] 93.6 kg (02/13 0630) Last BM Date : 02/16/23  Weight change: Filed Weights   02/15/23 0500 02/16/23 0500 02/17/23 0630  Weight: 100 kg 101.3 kg 93.6 kg    Intake/Output:   Intake/Output Summary (Last 24 hours) at 02/17/2023 1038 Last data filed at 02/17/2023 1037 Gross per 24 hour  Intake 1357.83 ml  Output 4630 ml  Net -3272.17 ml      Physical Exam   General:  Chronically ill appearing Neck: R internal jugular HD cath Cor: Regular rate & rhythm. No rubs, gallops or murmurs. Lungs: clear Abdomen: soft, nontender, nondistended.  Extremities: no cyanosis, clubbing, rash, 2+ edema to waist, R BKA Neuro: alert & orientedx3. Affect pleasant    Patient Profile   MARLO ARRIOLA is a 58 y.o. female with a hx of ITP s/p splenectomy 2022, DM2, hypertension, anemia, R BKA, HFpEF, CVA, ICH, PVD, CKD 4, neurogenic bladder, thyroid nodules, poor mobility, and poor compliance who is seen for evaluation of cor pulmonale.   Assessment/Plan   Pulmonary embolism/DVT Cor pulmonale - evidence of RV strain on echo 2/1 in the setting of active PE - R popliteal DVT as well - Heparin gtt for PE, eventual DOAC - RHC 2/10: Elevated RA, borderline elevated  PCWP, severe mixed PAH/pulmonary venous hypertension, high cardiac output with no evidence of shunt - CO-OX stable off milrinone.  - Failed IV diuresis. Started CRRT 02/10, currently pulling for negative 200/hr.  - On and off NE for MAP > 65. Diastolic pressures have been low. Target SBP > 90 rather than MAP. Can add midodrine if needed. - Continue sildenafil 20 TID   HFpEF - History of poor compliance with diuresis - Echo 6/22: EF 60-65, RV nl - Echo this admit EF 55-60%, G1DD, evidence of RV overload, RA severely dilated, mod to sev TR - CRRT as above for volume managmeent - GDMT limited by renal function and BP   AKI on CKD 3  - baseline unknown. Last Cr 1.4 in 2022 - component of cardio-renal in the setting of RV failure - renal US unremarkable - Nephrology following - Poor response to lasix gtt and metolazone on inotrope support.  Now on CRRT.   Elevated troponin - hs-trop 226>216 likely demand ischemia - presented with atypical CP (1 time episode of chest pain that self resolved and lasted a few seconds). CP likely related to PE. No recurrence. - EKG without changes in ST segment   Hypocalcemia - calcium replacement per primary   Hypervolemic hyponatremia - Na 132 - Limit free water intake - Volume management as above  Cough/sore throat - No longer having fever - COVID/flu negative - Respiratory  panel negative - Symptoms improving. Supportive care.   CRITICAL CARE Performed by: Anna Genre N   Total critical care time: 20 minutes  Critical care time was exclusive of separately billable procedures and treating other patients.  Critical care was necessary to treat or prevent imminent or life-threatening deterioration.  Critical care was time spent personally by me on the following activities: development of treatment plan with patient and/or surrogate as well as nursing, discussions with consultants, evaluation of patient's response to treatment, examination  of patient, obtaining history from patient or surrogate, ordering and performing treatments and interventions, ordering and review of laboratory studies, ordering and review of radiographic studies, pulse oximetry and re-evaluation of patient's condition.    Length of Stay: [address] LINDSAY N, PA-C  02/17/2023, 10:38 AM  Advanced Heart Failure Team Pager [phone] (M-F; 7a - 5p)  Please contact [facility] for night-coverage after hours (5p -7a ) and weekends on [email or web address]  Patient seen with PA, I formulated the plan and agree with the above note.   She is off norepinephrine now.  I/Os net negative 3169 with CVVH, UF close to 200 cc/hr net.    General: NAD Neck: JVP 16 cm, thyroid nodule noted Lungs: Clear to auscultation bilaterally with normal respiratory effort. CV: Nondisplaced PMI.  Heart regular S1/S2, no S3/S4, no murmur.  1+ edema to knee on left  Abdomen: Soft, nontender, no hepatosplenomegaly, no distention.  Skin: Intact without lesions or rashes.  Neurologic: Alert and oriented x 3.  Psych: Normal affect. Extremities: Right BKA  HEENT: Normal.   Patient with RV failure in setting of PE.  Echo this admission with EF 55-60%, moderate RV enlargement, PASP 65, moderate-severe TR. Now with AKI and volume overload, has proceeded to CVVH.  RHC with elevated R>L heart filling pressures and severe mixed pulmonary arterial/pulmonary venous hypertension + high CO with no evidence for shunt lesion.  Still volume overloaded with CVP 15-16, now getting CVVH pulling UF close to 200 cc/hr.  Off NE.  - Continue CVVH, pulling UF net negative 150-200 cc/hr.  - Continue sildenafil 20 tid.    She will continue on heparin gtt for PE.   Thyroid nodule chronic x years per patient, will send TSH.   CRITICAL CARE Performed by: Marca Ancona  Total critical care time: 35 minutes  Critical care time was exclusive of separately billable procedures and treating other patients.  Critical  care was necessary to treat or prevent imminent or life-threatening deterioration.  Critical care was time spent personally by me on the following activities: development of treatment plan with patient and/or surrogate as well as nursing, discussions with consultants, evaluation of patient's response to treatment, examination of patient, obtaining history from patient or surrogate, ordering and performing treatments and interventions, ordering and review of laboratory studies, ordering and review of radiographic studies, pulse oximetry and re-evaluation of patient's condition.  Marca Ancona 02/17/2023 12:15 PM

## 2023-02-18 ENCOUNTER — Inpatient Hospital Stay (HOSPITAL_COMMUNITY): Payer: Medicare Other | Admitting: Critical Care Medicine

## 2023-02-18 ENCOUNTER — Inpatient Hospital Stay (HOSPITAL_COMMUNITY): Payer: Medicare Other

## 2023-02-18 DIAGNOSIS — R519 Headache, unspecified: Secondary | ICD-10-CM | POA: Diagnosis not present

## 2023-02-18 DIAGNOSIS — I5033 Acute on chronic diastolic (congestive) heart failure: Secondary | ICD-10-CM | POA: Diagnosis not present

## 2023-02-18 DIAGNOSIS — J9621 Acute and chronic respiratory failure with hypoxia: Secondary | ICD-10-CM

## 2023-02-18 DIAGNOSIS — N1832 Chronic kidney disease, stage 3b: Secondary | ICD-10-CM | POA: Diagnosis not present

## 2023-02-18 LAB — COOXEMETRY PANEL
Carboxyhemoglobin: 2.2 % — ABNORMAL HIGH (ref 0.5–1.5)
Methemoglobin: <0.7 % (ref 0.0–1.5)
O2 Saturation: 77.1 %
Total hemoglobin: 11 g/dL — ABNORMAL LOW (ref 12.0–16.0)

## 2023-02-18 LAB — ECHOCARDIOGRAM LIMITED
Height: 61 in
S' Lateral: 1.6 cm
Weight: 3312.19 [oz_av]

## 2023-02-18 LAB — RENAL FUNCTION PANEL
Albumin: 2.5 g/dL — ABNORMAL LOW (ref 3.5–5.0)
Albumin: 2.6 g/dL — ABNORMAL LOW (ref 3.5–5.0)
Anion gap: 8 (ref 5–15)
Anion gap: 15 (ref 5–15)
BUN: 9 mg/dL (ref 6–20)
BUN: 9 mg/dL (ref 6–20)
CO2: 23 mmol/L (ref 22–32)
CO2: 23 mmol/L (ref 22–32)
Calcium: 8.2 mg/dL — ABNORMAL LOW (ref 8.9–10.3)
Calcium: 8.1 mg/dL — ABNORMAL LOW (ref 8.9–10.3)
Chloride: 97 mmol/L — ABNORMAL LOW (ref 98–111)
Chloride: 97 mmol/L — ABNORMAL LOW (ref 98–111)
Creatinine, Ser: 1.27 mg/dL — ABNORMAL HIGH (ref 0.44–1.00)
Creatinine, Ser: 1.3 mg/dL — ABNORMAL HIGH (ref 0.44–1.00)
GFR, Estimated: 50 mL/min — ABNORMAL LOW (ref >60)
GFR, Estimated: 48 mL/min — ABNORMAL LOW (ref >60)
Glucose, Bld: 99 mg/dL (ref 70–99)
Glucose, Bld: 164 mg/dL — ABNORMAL HIGH (ref 70–99)
Phosphorus: 2.2 mg/dL — ABNORMAL LOW (ref 2.5–4.6)
Phosphorus: >30 mg/dL — ABNORMAL HIGH (ref 2.5–4.6)
Potassium: 4.1 mmol/L (ref 3.5–5.1)
Potassium: 4.1 mmol/L (ref 3.5–5.1)
Sodium: 128 mmol/L — ABNORMAL LOW (ref 135–145)
Sodium: 134 mmol/L — ABNORMAL LOW (ref 135–145)

## 2023-02-18 LAB — HEPARIN LEVEL (UNFRACTIONATED): Heparin Unfractionated: 0.6 [IU]/mL (ref 0.30–0.70)

## 2023-02-18 LAB — CBC
HCT: 27.6 % — ABNORMAL LOW (ref 36.0–46.0)
Hemoglobin: 9 g/dL — ABNORMAL LOW (ref 12.0–15.0)
MCH: 29.8 pg (ref 26.0–34.0)
MCHC: 32.6 g/dL (ref 30.0–36.0)
MCV: 91.4 fL (ref 80.0–100.0)
Platelets: 284 10*3/uL (ref 150–400)
RBC: 3.02 MIL/uL — ABNORMAL LOW (ref 3.87–5.11)
RDW: 16.5 % — ABNORMAL HIGH (ref 11.5–15.5)
WBC: 11.8 10*3/uL — ABNORMAL HIGH (ref 4.0–10.5)
nRBC: 0.2 % (ref 0.0–0.2)

## 2023-02-18 LAB — MAGNESIUM: Magnesium: 2.2 mg/dL (ref 1.7–2.4)

## 2023-02-18 LAB — T4, FREE: Free T4: 1.09 ng/dL (ref 0.61–1.12)

## 2023-02-18 MED ORDER — DARBEPOETIN ALFA 100 MCG/0.5ML IJ SOSY
100.0000 ug | PREFILLED_SYRINGE | INTRAMUSCULAR | Status: DC
  Administered 2023-02-18: 100 ug via SUBCUTANEOUS
  Filled 2023-02-18: qty 0.5

## 2023-02-18 MED ORDER — KETOROLAC TROMETHAMINE 15 MG/ML IJ SOLN
15.0000 mg | Freq: Once | INTRAMUSCULAR | Status: AC
  Administered 2023-02-18: 15 mg via INTRAVENOUS
  Filled 2023-02-18: qty 1

## 2023-02-18 MED ORDER — NOREPINEPHRINE 16 MG/250ML-% IV SOLN
0.0000 ug/min | INTRAVENOUS | Status: DC
Start: 2023-02-18 — End: 2023-02-19
  Administered 2023-02-18: 2 ug/min via INTRAVENOUS
  Filled 2023-02-18: qty 250

## 2023-02-18 MED ORDER — SODIUM CHLORIDE 0.9 % IV SOLN
30.0000 mmol | Freq: Once | INTRAVENOUS | Status: AC
  Administered 2023-02-18: 30 mmol via INTRAVENOUS
  Filled 2023-02-18: qty 10

## 2023-02-18 NOTE — Progress Notes (Signed)
Patient ID: [name], female   DOB: 1965/03/19, 58 y.o.   MRN: [id number] [facility] Progress Note   Assessment/ Plan:   1.  Acute kidney injury on chronic kidney disease stage III: She likely has had progression of her underlying chronic kidney disease at baseline, at this time with superimposed acute kidney injury that is cardiorenal in mechanism  - on CRRT since 2/10 - doing well with ,increase UF to 200 mL/ hr-  need to continue CRRT for the short term as most efficient way to improve volume status 2.  Hypocalcemia: Refractory to earlier efforts at medical management.  PTH level elevated with severe vitamin D deficiency; continue calcitriol and ergocalciferol for treatment respectively. Now stable 3.  Acute exacerbation of congestive heart failure: Unimpressive urine output/volume unloading with medical therapy.  DC furosemide drip/metolazone after starting CRRT. 4.  Hyponatremia: Appears most consistent with inappropriate ADH state in the setting of CHF exacerbation.  Monitor with CRRT/UF and limit free water intake. 5. Elytes-  K ok on all 4 k bath-  needs phos repletion  6. Anemia-  check iron and add ESA   Subjective:   Neg 4300 on CRRT-  no UOP recorded -  she is alert-  c/o pain where vascath is    Objective:   BP (!) 104/41 (BP Location: Left Arm)   Pulse 74   Temp 98.9 F (37.2 C) (Oral)   Resp 17   Ht 5\' 1"  (1.549 m)   Wt 93.9 kg   LMP  (LMP Unknown)   SpO2 96%   BMI 39.11 kg/m   Intake/Output Summary (Last 24 hours) at 02/18/2023 0824 Last data filed at 02/18/2023 0800 Gross per 24 hour  Intake 968.08 ml  Output 5402.2 ml  Net -4434.12 ml   Weight change: 0.3 kg  Physical Exam: Gen: Resting uncomfortably in bed. Right sided vascath placed 2/10 CVS: Pulse regular rhythm, normal rate, S1 and S2 normal Resp: Clear to auscultation bilaterally without rales/rhonchi Abd: Soft, obese, nontender Ext: 2+ right BKA stump edema with 2+ left leg edema  extending to just above the knee  Imaging: DG CHEST PORT 1 VIEW Result Date: 02/16/2023 CLINICAL DATA:  CHF.  Lower extremity swelling. EXAM: PORTABLE CHEST 1 VIEW COMPARISON:  Radiographs yesterday. FINDINGS: Right upper extremity PICC and right internal jugular central line remain in place. Cardiomegaly is unchanged. Similar pulmonary edema. There are small bilateral pleural effusions. No focal airspace disease. No pneumothorax. IMPRESSION: Cardiomegaly with pulmonary edema and small bilateral pleural effusions. Electronically Signed   By: Narda Rutherford M.D.   On: 02/16/2023 15:48     Labs: BMET Recent Labs  Lab 02/15/23 0450 02/15/23 1605 02/16/23 0441 02/16/23 1632 02/17/23 0409 02/17/23 1600 02/18/23 0411  NA 132* 132* 133* 130* 132* 130* 128*  K 3.5 3.7 3.4* 4.0 3.8 4.1 4.1  CL 101 97* 102 98 97* 99 97*  CO2 22 22 24 23 24 25 23   GLUCOSE 107* 156* 107* 118* 115* 129* 99  BUN 46* 34* 26* 18 14 13 9   CREATININE 1.41* 1.36* 1.33* 1.23* 1.25* 1.38* 1.27*  CALCIUM 7.8* 8.2* 7.7* 8.0* 8.5* 8.1* 8.2*  PHOS 3.1 2.5 2.3* 1.9* 2.1* 1.9* 2.2*   CBC Recent Labs  Lab 02/15/23 0450 02/16/23 0441 02/17/23 0409 02/18/23 0411  WBC 11.7* 12.5* 11.7* 11.8*  HGB 8.3* 8.3* 8.7* 9.0*  HCT 24.7* 24.9* 26.0* 27.6*  MCV 87.9 89.6 89.3 91.4  PLT 259 247 272 284  Medications:     (feeding supplement) PROSource Plus  30 mL Oral Q1500   calcitRIOL  0.5 mcg Oral Daily   calcium carbonate  1,000 mg of elemental calcium Oral TID WC   Chlorhexidine Gluconate Cloth  6 each Topical Daily   feeding supplement (NEPRO CARB STEADY)  237 mL Oral BID BM   gabapentin  200 mg Oral BID   Gerhardt's butt cream   Topical TID   influenza vac split trivalent PF  0.5 mL Intramuscular Tomorrow-1000   multivitamin  1 tablet Oral QHS   sildenafil  20 mg Oral TID   sodium chloride flush  10-40 mL Intracatheter Q12H   Vitamin D (Ergocalciferol)  50,000 Units Oral Q7 days   Cecille Aver  02/18/2023, 8:24 AM

## 2023-02-18 NOTE — Plan of Care (Signed)
  Problem: Education: Goal: Knowledge of General Education information will improve Description: Including pain rating scale, medication(s)/side effects and non-pharmacologic comfort measures Outcome: Progressing   Problem: Clinical Measurements: Goal: Ability to maintain clinical measurements within normal limits will improve Outcome: Progressing Goal: Will remain free from infection Outcome: Progressing Goal: Diagnostic test results will improve Outcome: Progressing Goal: Respiratory complications will improve Outcome: Progressing Goal: Cardiovascular complication will be avoided Outcome: Progressing   Problem: Activity: Goal: Risk for activity intolerance will decrease Outcome: Progressing   Problem: Nutrition: Goal: Adequate nutrition will be maintained Outcome: Progressing   Problem: Coping: Goal: Level of anxiety will decrease Outcome: Progressing   Problem: Elimination: Goal: Will not experience complications related to bowel motility Outcome: Progressing Goal: Will not experience complications related to urinary retention Outcome: Progressing   Problem: Pain Managment: Goal: General experience of comfort will improve and/or be controlled Outcome: Progressing   Problem: Safety: Goal: Ability to remain free from injury will improve Outcome: Progressing   Problem: Skin Integrity: Goal: Risk for impaired skin integrity will decrease Outcome: Progressing   Problem: Cardiac: Goal: Ability to achieve and maintain adequate cardiopulmonary perfusion will improve Outcome: Progressing   Problem: Phase III Progression Outcomes Goal: Other Phase III Outcomes/Goals Outcome: Progressing

## 2023-02-18 NOTE — Progress Notes (Addendum)
Advanced Heart Failure Rounding Note  PCP-Cardiologist: Parke Poisson, MD  HF Cardiologist: Dr. Elwyn Lade   Chief Complaint: RV failure  Subjective:   Off pressors.    CRRT ~ 200 per hour.   Denies pain. Denies headache.   Objective:   Weight Range: 93.9 kg Body mass index is 39.11 kg/m.   Vital Signs:   Temp:  [98 F (36.7 C)-98.9 F (37.2 C)] 98.9 F (37.2 C) (02/14 0414) Pulse Rate:  [70-93] 74 (02/14 0800) Resp:  [13-28] 17 (02/14 0800) BP: (88-136)/(39-68) 104/41 (02/14 0800) SpO2:  [93 %-100 %] 96 % (02/14 0800) Weight:  [93.9 kg] 93.9 kg (02/14 0500) Last BM Date : 02/17/23  Weight change: Filed Weights   02/16/23 0500 02/17/23 0630 02/18/23 0500  Weight: 101.3 kg 93.6 kg 93.9 kg    Intake/Output:   Intake/Output Summary (Last 24 hours) at 02/18/2023 0855 Last data filed at 02/18/2023 0800 Gross per 24 hour  Intake 968.08 ml  Output 5402.2 ml  Net -4434.12 ml     CVP 11 Physical Exam   General:  On CRRT Neck: supple. JVP 10-11 Cor: PMI nondisplaced. Regular rate & rhythm. No rubs, gallops or murmurs. Lungs: clear Extremities: no cyanosis, clubbing, rash, R and LLE 1-2+  edema Neuro: alert & orientedx3,   Patient Profile   [name] is a 58 y.o. female with a hx of ITP s/p splenectomy 2022, DM2, hypertension, anemia, R BKA, HFpEF, CVA, ICH, PVD, CKD 4, neurogenic bladder, thyroid nodules, poor mobility, and poor compliance who is seen for evaluation of cor pulmonale.   Assessment/Plan   Pulmonary embolism/DVT Cor pulmonale - evidence of RV strain on echo 2/1 in the setting of active PE - R popliteal DVT as well - Heparin gtt for PE, eventual DOAC - RHC 2/10: Elevated RA, borderline elevated PCWP, severe mixed PAH/pulmonary venous hypertension, high cardiac output with no evidence of shunt - Failed IV diuresis. Started CRRT 02/10, currently pulling for negative 200/hr. CVP down to 11.  - Continue sildenafil 20 TID for RV. ?  Headache related to sildenafil.     HFpEF - History of poor compliance with diuresis - Echo 6/22: EF 60-65, RV nl - Echo this admit EF 55-60%, G1DD, evidence of RV overload, RA severely dilated, mod to sev TR - CRRT for volume management.  - GDMT limited by renal function and BP   AKI on CKD 3  - baseline unknown. Last Cr 1.4 in 2022 - component of cardio-renal in the setting of RV failure - renal US unremarkable - Nephrology following - Poor response to lasix gtt and metolazone on inotrope support.   - CRRT per Nephrology.   Elevated troponin--> suspect ishchemia  - hs-trop 226>216 likely demand ischemia - presented with atypical CP (1 time episode of chest pain that self resolved and lasted a few seconds). CP likely related to PE. No recurrence. - EKG without changes in ST segment   Hypocalcemia - calcium replacement per primary   Hypervolemic hyponatremia - Na 128 -Nephrology adjusting treatment.   Cough/sore throat - COVID/flu negative - Respiratory panel negative - Symptoms improving. Supportive care.   CRITICAL CARE Performed by: Tonye Becket NP-C    Total critical care time: 20  minutes  Critical care time was exclusive of separately billable procedures and treating other patients.  Critical care was necessary to treat or prevent imminent or life-threatening deterioration.  Critical care was time spent personally by me on the following activities:  development of treatment plan with patient and/or surrogate as well as nursing, discussions with consultants, evaluation of patient's response to treatment, examination of patient, obtaining history from patient or surrogate, ordering and performing treatments and interventions, ordering and review of laboratory studies, ordering and review of radiographic studies, pulse oximetry and re-evaluation of patient's condition.    Length of Stay: 13  Tonye Becket, NP  02/18/2023, 8:55 AM  Advanced Heart Failure Team Pager  [phone] (M-F; 7a - 5p)  Please contact [facility] for night-coverage after hours (5p -7a ) and weekends on [email or web address]  Patient seen with NP, I formulated the plan and agree with the above note.    I/Os net negative [id number] with CVVH, net UF 200 cc/hr.  CVP 14 on my measure today.    She has a headache on and off.   General: NAD Neck: JVP 14 cm, goiter  Lungs: Clear to auscultation bilaterally with normal respiratory effort. CV: Nondisplaced PMI.  Heart regular S1/S2, no S3/S4, no murmur. 1+ edema left ankle.     Abdomen: Soft, nontender, no hepatosplenomegaly, no distention.  Skin: Intact without lesions or rashes.  Neurologic: Alert and oriented x 3.  Psych: Normal affect. Extremities: R BKA HEENT: Normal.   Patient with RV failure in setting of PE.  Echo this admission with EF 55-60%, moderate RV enlargement, PASP 65, moderate-severe TR. Now with AKI and volume overload, has proceeded to CVVH.  RHC with elevated R>L heart filling pressures and severe mixed pulmonary arterial/pulmonary venous hypertension + high CO with no evidence for shunt lesion.  Still volume overloaded with CVP 14, now getting CVVH pulling UF close to 200 cc/hr.  Off NE.  - Continue CVVH, pulling UF net negative 200 cc/hr.  - Continue sildenafil 20 tid => may have to stop if headache does not resolve.    She will continue on heparin gtt for PE. Platelets stable.    Thyroid nodule chronic x years per patient. TSH low but free T4 normal.  Follow as outpatient.   CRITICAL CARE Performed by: Marca Ancona  Total critical care time: 35 minutes  Critical care time was exclusive of separately billable procedures and treating other patients.  Critical care was necessary to treat or prevent imminent or life-threatening deterioration.  Critical care was time spent personally by me on the following activities: development of treatment plan with patient and/or surrogate as well as nursing, discussions with  consultants, evaluation of patient's response to treatment, examination of patient, obtaining history from patient or surrogate, ordering and performing treatments and interventions, ordering and review of laboratory studies, ordering and review of radiographic studies, pulse oximetry and re-evaluation of patient's condition.  Marca Ancona 02/18/2023 11:33 AM

## 2023-02-18 NOTE — Consult Note (Signed)
NAME:  [name], MRN:  [id number], DOB:  [birth year], LOS: 13 ADMISSION DATE:  02/04/2023, CONSULTATION DATE:  2/10 REFERRING MD:  Wilhemina Cash, CHIEF COMPLAINT:  severe RV failure, renal failure   History of Present Illness:  [name] is a 58 year old woman admitted on 02/05/2023 after presenting with lower extremity edema pleural effusions on chest x-ray.  She had had worsening lower extremity edema for about a week prior to admission.  She admits to generalized weakness, decreased urine output despite using Lasix.  She had 1 episode of left-sided chest pain that resolved.  No shortness of breath or cough at baseline.  Since admission she has had progressive renal failure, been diagnosed with bilateral LL PE, and progressive RHF due to inability to diurese despite milrinone and lasix infusions. She underwent RHC today with high CO and severe PH- mixed venous & arterial with borderline PCWP. HD catheter was placed in cath lab and she was transferred to ICU for CRRT.  She denies complaints currently.   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 Continues to tolerate CRRT. Headache still an issue.   Interim History / Subjective:  Tearful this morning. Having intermittent right sided headache that is very frustrating to her. Query tension headache. This all started after HD cath placement. Tolerating UF goals. She has a harsh rub on exam today. Has had murmur on prior exams. EKG without diffuse ST elevations. Getting repeat echo.   Objective   Blood  pressure (!) 103/47, pulse 91, temperature 97.6 F (36.4 C), temperature source Axillary, resp. rate 19, height 5\' 1"  (1.549 m), weight 93.9 kg, SpO2 (!) 80%. CVP:  [11 mmHg-30 mmHg] 22 mmHg      Intake/Output Summary (Last 24 hours) at 02/18/2023 1154 Last data filed at 02/18/2023 1100 Gross per 24 hour  Intake 1178.51 ml  Output 5651.1 ml  Net -4472.59 ml   Filed Weights   02/16/23 0500 02/17/23 0630 02/18/23 0500  Weight: 101.3 kg 93.6 kg 93.9 kg    Examination: General: acute on chronically ill appearing female, sitting in bed, tearful  HENT: [place]/at, anicteric sclera, perrla, mild periorbital edema, room air Lungs: resp even and unlabored, diminished bases but otherwise clear, room air Cardiovascular: s1s2, harsh systolic rub Abdomen: rounded, soft, ntnd Extremities: edema bilaterally - improving , R BKA with ted hose Neuro: awake, alert, oriented, non focal exam Skin: warm, dry   Resolved Hospital Problem list   Hypokalemia NAGMA Diarrhea Assessment & Plan:  Acute on chronic right heart failure Acute bilateral lower lobe pulmonary emboli with cor pulmonale; R popliteal DVT Severe pulmonary hypertension, mixed arterial and venous -advanced heart failure following, appreciate assistance  - harsh rub on exam today - EKG without evidence of pericarditis. Repeat ECHO today -On and off low-dose norepinephrine due to her diastolic pressures.  SBP goal greater than 90 rather than maps for heart failure. Stopped levo yesterday  - CRRT for volume management, UF @ 200 - con't sildenafil 20mg  TID  - con't heparin gtt, will need long-term DOAC  - con't tele monitoring  - GDMT limited by  renal function   AKI on CKD 3b Hypervolemic hyponatremia - nephrology following, appreciate CRRT management  - con't UF @ 200 - renally dose meds, avoid nephrotoxic meds - strict I/O - renal diet  Headache - getting x1 dose toradol 15mg  for headache  - prn tylenol  - prn excedrin    History of diabetes  Diabetic neuropathy  PAD  A1c 5.4. not on insulin  - cbg monitoring   Chronic neuropathic pain -con't reduced / renally dose gabapentin  Chronic anemia due to chronic disease - transfuse for Hb <7 or hemodynamically significant bleeding  Deconditioning -PT, OT once able to move around more  Best Practice (right click and "Reselect all SmartList Selections" daily)   Diet/type: Regular consistency (see orders) DVT prophylaxis systemic heparin Pressure ulcer(s): present on admission stage I, left buttock GI prophylaxis: N/A Lines: Central line, Dialysis Catheter, and yes and it is still needed Foley:  N/A Code Status:  full code Last date of multidisciplinary goals of care discussion [patient updated 2/14]  Labs   CBC: Recent Labs  Lab 02/14/23 0343 02/14/23 1514 02/14/23 1518 02/15/23 0450 02/16/23 0441 02/17/23 0409 02/18/23 0411  WBC 9.4  --   --  11.7* 12.5* 11.7* 11.8*  HGB 8.1*   < > 10.5* 8.3* 8.3* 8.7* 9.0*  HCT 23.2*   < > 31.0* 24.7* 24.9* 26.0* 27.6*  MCV 88.2  --   --  87.9 89.6 89.3 91.4  PLT 233  --   --  259 247 272 284   < > = values in this interval not displayed.    Basic Metabolic Panel: Recent Labs  Lab 02/14/23 0343 02/14/23 1514 02/15/23 0450 02/15/23 1605 02/16/23 0441 02/16/23 1632 02/17/23 0409 02/17/23 1600 02/18/23 0411  NA 127*   < > 132*   < > 133* 130* 132* 130* 128*  K 2.9*   < > 3.5   < > 3.4* 4.0 3.8 4.1 4.1  CL 93*   < > 101   < > 102 98 97* 99 97*  CO2 18*   < > 22   < > 24 23 24 25 23   GLUCOSE 151*   < > 107*   < > 107* 118* 115* 129* 99  BUN 72*   < > 46*   < > 26* 18 14 13 9   CREATININE 2.22*   < > 1.41*   < > 1.33* 1.23* 1.25* 1.38* 1.27*  CALCIUM 7.9*   < > 7.8*   < > 7.7* 8.0* 8.5* 8.1* 8.2*  MG 1.9  --  2.1  --  2.1  --  2.2  --  2.2  PHOS  --    < > 3.1   < > 2.3* 1.9* 2.1* 1.9* 2.2*   < > = values in this interval not displayed.   GFR: Estimated Creatinine Clearance: 51.7 mL/min (A)  (by C-G formula based on SCr of 1.27 mg/dL (H)). Recent Labs  Lab 02/15/23 0450 02/16/23 0441 02/17/23 0409 02/18/23 0411  WBC 11.7* 12.5* 11.7* 11.8*    Liver Function Tests: Recent Labs  Lab 02/16/23 0441 02/16/23 1632 02/17/23 0409 02/17/23 1600 02/18/23 0411  ALBUMIN 2.3* 2.4* 2.3* 2.4* 2.5*      Critical care time:  35    Lenard Galloway [facility] 02/18/23 11:57 AM  Please see [email or web address] for pager details.  From 7A-7P if no response, please call [phone] After hours, please call ELink [phone]

## 2023-02-18 NOTE — Progress Notes (Signed)
PHARMACY - ANTICOAGULATION CONSULT NOTE  Pharmacy Consult for heparin Indication: DVT  No Known Allergies  Patient Measurements: Height: 5\' 1"  (154.9 cm) Weight: 93.9 kg (207 lb 0.2 oz) IBW/kg (Calculated) : 47.8 Heparin Dosing Weight: 85kg  Vital Signs: Temp: 98.9 F (37.2 C) (02/14 0414) Temp Source: Oral (02/14 0414) BP: 104/41 (02/14 0800) Pulse Rate: 74 (02/14 0800)  Labs: Recent Labs    02/16/23 0441 02/16/23 1632 02/17/23 0409 02/17/23 1600 02/18/23 0411  HGB 8.3*  --  8.7*  --  9.0*  HCT 24.9*  --  26.0*  --  27.6*  PLT 247  --  272  --  284  HEPARINUNFRC 0.31  --  0.50  --  0.60  CREATININE 1.33*   < > 1.25* 1.38* 1.27*   < > = values in this interval not displayed.    Estimated Creatinine Clearance: 51.7 mL/min (A) (by C-G formula based on SCr of 1.27 mg/dL (H)).  Assessment: 58 yo female admitted for CHF and doppler positive for DVT.  VQ shows perfusion defects both lower lungs consistent with presence of PE.  Pharmacy dosing heparin.   Heparin level is therapeutic at 0.6, on 1750 units/hr. Hgb 9, plt 284. No s/sx of bleeding or infusion issues.   Goal of Therapy:  Heparin level 0.3-0.7 units/ml Monitor platelets by anticoagulation protocol: Yes   Plan:  Continue heparin at 1750 units/hr Check heparin level, CBC in am  Thank you for allowing pharmacy to participate in this patient's care,  Sherron Monday, PharmD, BCCCP Clinical Pharmacist  Phone: [phone] 02/18/2023 8:55 AM  Please check AMION for all [facility] phone numbers After 10:00 PM, call Main Pharmacy [phone]

## 2023-02-19 ENCOUNTER — Inpatient Hospital Stay (HOSPITAL_COMMUNITY): Payer: Medicare Other

## 2023-02-19 DIAGNOSIS — I469 Cardiac arrest, cause unspecified: Secondary | ICD-10-CM

## 2023-02-19 DIAGNOSIS — I5033 Acute on chronic diastolic (congestive) heart failure: Secondary | ICD-10-CM | POA: Diagnosis not present

## 2023-02-19 LAB — CBC WITH DIFFERENTIAL/PLATELET
Abs Immature Granulocytes: 0.95 10*3/uL — ABNORMAL HIGH (ref 0.00–0.07)
Basophils Absolute: 0 10*3/uL (ref 0.0–0.1)
Basophils Relative: 0 %
Eosinophils Absolute: 0.1 10*3/uL (ref 0.0–0.5)
Eosinophils Relative: 1 %
HCT: 25.3 % — ABNORMAL LOW (ref 36.0–46.0)
Hemoglobin: 7.4 g/dL — ABNORMAL LOW (ref 12.0–15.0)
Immature Granulocytes: 8 %
Lymphocytes Relative: 20 %
Lymphs Abs: 2.5 10*3/uL (ref 0.7–4.0)
MCH: 30 pg (ref 26.0–34.0)
MCHC: 29.2 g/dL — ABNORMAL LOW (ref 30.0–36.0)
MCV: 102.4 fL — ABNORMAL HIGH (ref 80.0–100.0)
Monocytes Absolute: 0.4 10*3/uL (ref 0.1–1.0)
Monocytes Relative: 3 %
Neutro Abs: 9.5 10*3/uL — ABNORMAL HIGH (ref 1.7–7.7)
Neutrophils Relative %: 76 %
Platelets: 200 10*3/uL (ref 150–400)
RBC: 2.47 MIL/uL — ABNORMAL LOW (ref 3.87–5.11)
RDW: 17.3 % — ABNORMAL HIGH (ref 11.5–15.5)
WBC: 12.5 10*3/uL — ABNORMAL HIGH (ref 4.0–10.5)
nRBC: 0.9 % — ABNORMAL HIGH (ref 0.0–0.2)
nRBC: 2 /100{WBCs} — ABNORMAL HIGH

## 2023-02-19 LAB — COMPREHENSIVE METABOLIC PANEL
ALT: 12 U/L (ref 0–44)
AST: 38 U/L (ref 15–41)
Albumin: 2.5 g/dL — ABNORMAL LOW (ref 3.5–5.0)
Alkaline Phosphatase: 85 U/L (ref 38–126)
Anion gap: 23 — ABNORMAL HIGH (ref 5–15)
BUN: 13 mg/dL (ref 6–20)
CO2: 15 mmol/L — ABNORMAL LOW (ref 22–32)
Calcium: 8.2 mg/dL — ABNORMAL LOW (ref 8.9–10.3)
Chloride: 95 mmol/L — ABNORMAL LOW (ref 98–111)
Creatinine, Ser: 1.79 mg/dL — ABNORMAL HIGH (ref 0.44–1.00)
GFR, Estimated: 33 mL/min — ABNORMAL LOW (ref >60)
Glucose, Bld: 383 mg/dL — ABNORMAL HIGH (ref 70–99)
Potassium: 5.3 mmol/L — ABNORMAL HIGH (ref 3.5–5.1)
Sodium: 133 mmol/L — ABNORMAL LOW (ref 135–145)
Total Bilirubin: 0.7 mg/dL (ref 0.0–1.2)
Total Protein: 7.3 g/dL (ref 6.5–8.1)

## 2023-02-19 LAB — PHOSPHORUS: Phosphorus: 6.3 mg/dL — ABNORMAL HIGH (ref 2.5–4.6)

## 2023-02-19 LAB — POCT I-STAT EG7
Acid-base deficit: 20 mmol/L — ABNORMAL HIGH (ref 0.0–2.0)
Bicarbonate: 11.3 mmol/L — ABNORMAL LOW (ref 20.0–28.0)
Calcium, Ion: 1 mmol/L — ABNORMAL LOW (ref 1.15–1.40)
HCT: 33 % — ABNORMAL LOW (ref 36.0–46.0)
Hemoglobin: 11.2 g/dL — ABNORMAL LOW (ref 12.0–15.0)
O2 Saturation: 66 %
Patient temperature: 98
Potassium: 5.1 mmol/L (ref 3.5–5.1)
Sodium: 137 mmol/L (ref 135–145)
TCO2: 13 mmol/L — ABNORMAL LOW (ref 22–32)
pCO2, Ven: 46 mm[Hg] (ref 44–60)
pH, Ven: 6.996 — CL (ref 7.25–7.43)
pO2, Ven: 50 mm[Hg] — ABNORMAL HIGH (ref 32–45)

## 2023-02-19 LAB — POCT I-STAT 7, (LYTES, BLD GAS, ICA,H+H)
Acid-base deficit: 20 mmol/L — ABNORMAL HIGH (ref 0.0–2.0)
Bicarbonate: 11.8 mmol/L — ABNORMAL LOW (ref 20.0–28.0)
Calcium, Ion: 0.98 mmol/L — ABNORMAL LOW (ref 1.15–1.40)
HCT: 26 % — ABNORMAL LOW (ref 36.0–46.0)
Hemoglobin: 8.8 g/dL — ABNORMAL LOW (ref 12.0–15.0)
O2 Saturation: 81 %
Patient temperature: 98
Potassium: 5 mmol/L (ref 3.5–5.1)
Sodium: 140 mmol/L (ref 135–145)
TCO2: 14 mmol/L — ABNORMAL LOW (ref 22–32)
pCO2 arterial: 59.7 mm[Hg] — ABNORMAL HIGH (ref 32–48)
pH, Arterial: 6.903 — CL (ref 7.35–7.45)
pO2, Arterial: 74 mm[Hg] — ABNORMAL LOW (ref 83–108)

## 2023-02-19 LAB — POCT I-STAT, CHEM 8
BUN: 13 mg/dL (ref 6–20)
Calcium, Ion: 0.98 mmol/L — ABNORMAL LOW (ref 1.15–1.40)
Chloride: 105 mmol/L (ref 98–111)
Creatinine, Ser: 1.4 mg/dL — ABNORMAL HIGH (ref 0.44–1.00)
Glucose, Bld: 292 mg/dL — ABNORMAL HIGH (ref 70–99)
HCT: 32 % — ABNORMAL LOW (ref 36.0–46.0)
Hemoglobin: 10.9 g/dL — ABNORMAL LOW (ref 12.0–15.0)
Potassium: 5.1 mmol/L (ref 3.5–5.1)
Sodium: 137 mmol/L (ref 135–145)
TCO2: 14 mmol/L — ABNORMAL LOW (ref 22–32)

## 2023-02-19 LAB — TROPONIN I (HIGH SENSITIVITY): Troponin I (High Sensitivity): 30 ng/L — ABNORMAL HIGH (ref <18)

## 2023-02-19 LAB — DIC (DISSEMINATED INTRAVASCULAR COAGULATION)PANEL
D-Dimer, Quant: 2.87 ug{FEU}/mL — ABNORMAL HIGH (ref 0.00–0.50)
Fibrinogen: 734 mg/dL — ABNORMAL HIGH (ref 210–475)
INR: 1.7 — ABNORMAL HIGH (ref 0.8–1.2)
Platelets: 248 10*3/uL (ref 150–400)
Prothrombin Time: 19.8 s — ABNORMAL HIGH (ref 11.4–15.2)
Smear Review: NONE SEEN
aPTT: >200 s (ref 24–36)

## 2023-02-19 LAB — HEPARIN LEVEL (UNFRACTIONATED): Heparin Unfractionated: 0.59 [IU]/mL (ref 0.30–0.70)

## 2023-02-19 LAB — LACTIC ACID, PLASMA: Lactic Acid, Venous: >9 mmol/L (ref 0.5–1.9)

## 2023-02-19 LAB — GLUCOSE, CAPILLARY: Glucose-Capillary: 216 mg/dL — ABNORMAL HIGH (ref 70–99)

## 2023-02-19 LAB — IRON AND TIBC
Iron: 52 ug/dL (ref 28–170)
Saturation Ratios: 33 % — ABNORMAL HIGH (ref 10.4–31.8)
TIBC: 158 ug/dL — ABNORMAL LOW (ref 250–450)
UIBC: 106 ug/dL

## 2023-02-19 LAB — MAGNESIUM: Magnesium: 2.8 mg/dL — ABNORMAL HIGH (ref 1.7–2.4)

## 2023-02-19 LAB — FERRITIN: Ferritin: 548 ng/mL — ABNORMAL HIGH (ref 11–307)

## 2023-02-19 MED ORDER — AMIODARONE IV BOLUS ONLY 150 MG/100ML
INTRAVENOUS | Status: AC
  Administered 2023-02-19: 150 mg
  Filled 2023-02-19: qty 100

## 2023-02-19 MED ORDER — EPINEPHRINE 1 MG/10ML IJ SOSY
PREFILLED_SYRINGE | INTRAMUSCULAR | Status: AC | PRN
  Administered 2023-02-19: 1 mg via INTRAVENOUS

## 2023-02-19 MED ORDER — PHENYLEPHRINE HCL-NACL 20-0.9 MG/250ML-% IV SOLN
INTRAVENOUS | Status: AC
Start: 2023-02-19 — End: 2023-02-19
  Administered 2023-02-19: 100 ug/min via INTRAVENOUS
  Filled 2023-02-19: qty 250

## 2023-02-19 MED ORDER — ORAL CARE MOUTH RINSE: 15.0000 mL | OROMUCOSAL | Status: DC | PRN

## 2023-02-19 MED ORDER — EPINEPHRINE 1 MG/10ML IJ SOSY
PREFILLED_SYRINGE | INTRAMUSCULAR | Status: AC | PRN
  Administered 2023-02-19 (×7): 1 mg via INTRAVENOUS

## 2023-02-19 MED ORDER — MIDAZOLAM HCL 2 MG/2ML IJ SOLN: 4.0000 mg | Freq: Once | INTRAMUSCULAR | Status: AC

## 2023-02-19 MED ORDER — FENTANYL CITRATE (PF) 100 MCG/2ML IJ SOLN
INTRAMUSCULAR | Status: AC | PRN
  Administered 2023-02-19: 50 ug via INTRAVENOUS

## 2023-02-19 MED ORDER — EPINEPHRINE 1 MG/10ML IJ SOSY
PREFILLED_SYRINGE | INTRAMUSCULAR | Status: AC | PRN
  Administered 2023-02-18: 1 mg via INTRAVENOUS

## 2023-02-19 MED ORDER — MIDAZOLAM HCL 2 MG/2ML IJ SOLN: 2.0000 mg | INTRAMUSCULAR | Status: DC | PRN

## 2023-02-19 MED ORDER — FAMOTIDINE 20 MG PO TABS: 20.0000 mg | ORAL_TABLET | Freq: Two times a day (BID) | ORAL | Status: DC

## 2023-02-19 MED ORDER — SODIUM BICARBONATE 8.4 % IV SOLN: 100.0000 meq | Freq: Once | INTRAVENOUS | Status: AC

## 2023-02-19 MED ORDER — MIDAZOLAM HCL 2 MG/2ML IJ SOLN
INTRAMUSCULAR | Status: AC
  Administered 2023-02-19: 4 mg via INTRAVENOUS
  Filled 2023-02-19: qty 4

## 2023-02-19 MED ORDER — FENTANYL CITRATE PF 50 MCG/ML IJ SOSY
100.0000 ug | PREFILLED_SYRINGE | Freq: Once | INTRAMUSCULAR | Status: AC
  Administered 2023-02-19: 100 ug via INTRAVENOUS

## 2023-02-19 MED ORDER — ORAL CARE MOUTH RINSE: 15.0000 mL | OROMUCOSAL | Status: DC

## 2023-02-19 MED ORDER — AMIODARONE HCL 150 MG/3ML IV SOLN
INTRAVENOUS | Status: AC | PRN
Start: 2023-02-19 — End: 2023-02-19
  Administered 2023-02-19: 150 mg via INTRAVENOUS

## 2023-02-19 MED ORDER — SODIUM BICARBONATE 8.4 % IV SOLN
INTRAVENOUS | Status: AC
  Administered 2023-02-19: 100 meq via INTRAVENOUS
  Filled 2023-02-19: qty 100

## 2023-02-19 MED ORDER — EPINEPHRINE HCL 5 MG/250ML IV SOLN IN NS
INTRAVENOUS | Status: AC
Start: 2023-02-19 — End: 2023-02-19
  Administered 2023-02-19: 20 ug/min via INTRAVENOUS
  Filled 2023-02-19: qty 250

## 2023-02-19 MED ORDER — PHENYLEPHRINE HCL-NACL 20-0.9 MG/250ML-% IV SOLN: 0.0000 ug/min | INTRAVENOUS | Status: DC

## 2023-02-19 MED ORDER — MIDAZOLAM HCL 5 MG/5ML IJ SOLN
INTRAMUSCULAR | Status: AC | PRN
  Administered 2023-02-19: 4 mg via INTRAVENOUS

## 2023-02-19 MED ORDER — EPINEPHRINE HCL 5 MG/250ML IV SOLN IN NS: 0.5000 ug/min | INTRAVENOUS | Status: DC

## 2023-02-19 MED ORDER — FENTANYL CITRATE PF 50 MCG/ML IJ SOSY
50.0000 ug | PREFILLED_SYRINGE | INTRAMUSCULAR | Status: DC | PRN
  Filled 2023-02-19: qty 1

## 2023-02-19 MED ORDER — SODIUM BICARBONATE 8.4 % IV SOLN
INTRAVENOUS | Status: AC | PRN
  Administered 2023-02-19 (×2): 50 meq via INTRAVENOUS
  Administered 2023-02-19: 100 meq via INTRAVENOUS
  Administered 2023-02-19 (×2): 50 meq via INTRAVENOUS

## 2023-02-20 LAB — T3, FREE: T3, Free: 2.5 pg/mL (ref 2.0–4.4)

## 2023-03-05 NOTE — Anesthesia Procedure Notes (Addendum)
 Procedure Name: Intubation Date/Time: 02/26/2023 12:06 AM  Performed by: Rachel Moulds, CRNAPre-anesthesia Checklist: Patient identified, Emergency Drugs available, Suction available, Patient being monitored and Timeout performed Patient Re-evaluated:Patient Re-evaluated prior to induction Oxygen Delivery Method: Ambu bag Preoxygenation: Pre-oxygenation with 100% oxygen Grade View: Grade II Tube type: Oral Tube size: 7.0 mm Number of attempts: 1 Airway Equipment and Method: Video-laryngoscopy and Rigid stylet Placement Confirmation: breath sounds checked- equal and bilateral, CO2 detector, positive ETCO2 and ETT inserted through vocal cords under direct vision Secured at: 22 cm Tube secured with: Tape Dental Injury: Teeth and Oropharynx as per pre-operative assessment

## 2023-03-05 NOTE — Death Summary Note (Signed)
 DEATH SUMMARY   Patient Details  Name: [name] MRN: [id number] DOB: Dec 21, 1965  Admission/Discharge Information   Admit Date:  February 14, 2023  Date of Death: Date of Death: March 01, 2023  Time of Death: Time of Death: 0129  Length of Stay: 02/28/2023  Referring Physician: System, Provider Not In   Reason(s) for Hospitalization  Acute on chronic right-sided heart failure Acute bilateral pulmonary emboli with cor pulmonale Demand cardiac ischemia Acute right popliteal DVT Severe pulmonary hypertension AKI on CKD stage IIIa Hypervolemic hyponatremia/hypocalcemia Diabetes type 2 Anemia of chronic disease Moderate pericardial effusion  Diagnoses  Preliminary cause of death: PEA cardiac arrest likely secondary to massive PE Secondary Diagnoses (including complications and co-morbidities):  Principal Problem:   Acute on chronic heart failure with preserved ejection fraction (HFpEF) (HCC) Active Problems:   HTN (hypertension)   Diabetes mellitus (HCC)   AKI (acute kidney injury) (HCC)   Hyperkalemia   Hyponatremia   Metabolic acidosis   Acute deep vein thrombosis (DVT) of popliteal vein of right lower extremity (HCC)   Pulmonary hypertension, unspecified (HCC)   Pulmonary embolus (HCC)   Right ventricular failure (HCC)   Pressure injury of skin   Malnutrition of moderate degree   Brief Hospital Course (including significant findings, care, treatment, and services provided and events leading to death)  [name] is a 58 y.o. year old female who 58 year old woman admitted on 02/05/2023 after presenting with lower extremity edema pleural effusions on chest x-ray. She had had worsening lower extremity edema for about a week prior to admission. She admits to generalized weakness, decreased urine output despite using Lasix. She had 1 episode of left-sided chest pain that resolved. No shortness of breath or cough at baseline. Since admission she has had progressive renal failure, been  diagnosed with bilateral LL PE, and progressive RHF due to inability to diurese despite milrinone and lasix infusions. She underwent RHC today with high CO and severe PH- mixed venous & arterial with borderline PCWP. HD catheter was placed in cath lab and she was transferred to ICU for CRRT  Patient was started on CRRT, she remained net negative, nephrology and advanced heart failure team were following. She was continued on IV heparin infusion.  Patient had elevated troponin could be due to demand cardiac ischemia as she had PE with cor pulmonale also she was noted to have acute right popliteal DVT.  On 02-28-23 around midnight patient became hypotensive and went into PEA cardiac arrest, ROSC was achieved initially but she again became hypotensive and went into PEA cardiac arrest, bedside echocardiogram was done which showed dilated RV with moderate pericardial effusion.  Cardiology was consulted, stat echocardiogram showed moderate pericardial effusion but not consistent with tamponade physiology.  With RV dilatation this could be patient had recurrent massive pulmonary emboli.   Patient received 40 minutes of CPR, without achieving ROSC again, she was declared dead on 12-29-23 at 1:29 AM.  Patient's family was called and informed   Pertinent Labs and Studies  Significant Diagnostic Studies ECHOCARDIOGRAM LIMITED Result Date: 2023/02/28    ECHOCARDIOGRAM LIMITED REPORT   Patient Name:   ANNELIES COYT Date of Exam: 02/28/2023 Medical Rec #:  [id number]         Height:       61.0 in Accession #:    [id number]        Weight:       207.0 lb Date of Birth:  1965-03-10         BSA:  1.917 m Patient Age:    57 years          BP:           103/47 mmHg Patient Gender: F                 HR:           80 bpm. Exam Location:  Inpatient Procedure: Limited Echo, Cardiac Doppler and Color Doppler (Both Spectral and            Color Flow Doppler were utilized during procedure). Indications:    Congestive  Heart Failure I50.9  History:        Patient has prior history of Echocardiogram examinations, most                 recent 02/05/2023. CHF, Stroke; Risk Factors:Diabetes.  Sonographer:    Darlys Gales Referring Phys: [phone] AMY D CLEGG IMPRESSIONS  1. Left ventricular ejection fraction, by estimation, is 60 to 65%. The left ventricle has normal function. The left ventricle has no regional wall motion abnormalities.  2. Right ventricular systolic function is moderately reduced. The right ventricular size is normal. There is moderately elevated pulmonary artery systolic pressure.  3. Loculated effusion vs pericardial fat anterior to the right ventricle. The effusion is larger than on the echo 02/05/2023. Moderate pericardial effusion. There is no evidence of cardiac tamponade.  4. The mitral valve is normal in structure. No evidence of mitral valve regurgitation. No evidence of mitral stenosis.  5. Tricuspid valve regurgitation is mild to moderate.  6. The aortic valve is normal in structure. Aortic valve regurgitation is not visualized. No aortic stenosis is present.  7. The inferior vena cava is normal in size with greater than 50% respiratory variability, suggesting right atrial pressure of 3 mmHg. FINDINGS  Left Ventricle: Left ventricular ejection fraction, by estimation, is 60 to 65%. The left ventricle has normal function. The left ventricle has no regional wall motion abnormalities. The left ventricular internal cavity size was normal in size. There is  no left ventricular hypertrophy. Right Ventricle: The right ventricular size is normal. No increase in right ventricular wall thickness. Right ventricular systolic function is moderately reduced. There is moderately elevated pulmonary artery systolic pressure. The tricuspid regurgitant velocity is 3.25 m/s, and with an assumed right atrial pressure of 15 mmHg, the estimated right ventricular systolic pressure is 57.2 mmHg. Left Atrium: Left atrial size was normal  in size. Right Atrium: Right atrial size was normal in size. Pericardium: Loculated effusion vs pericardial fat anterior to the right ventricle. The effusion is larger than on the echo 02/05/2023. A moderately sized pericardial effusion is present. There is no evidence of cardiac tamponade. Mitral Valve: The mitral valve is normal in structure. No evidence of mitral valve stenosis. Tricuspid Valve: The tricuspid valve is normal in structure. Tricuspid valve regurgitation is mild to moderate. No evidence of tricuspid stenosis. Aortic Valve: The aortic valve is normal in structure. Aortic valve regurgitation is not visualized. No aortic stenosis is present. Pulmonic Valve: The pulmonic valve was normal in structure. Pulmonic valve regurgitation is mild. No evidence of pulmonic stenosis. Aorta: The aortic root is normal in size and structure. Venous: The inferior vena cava is normal in size with greater than 50% respiratory variability, suggesting right atrial pressure of 3 mmHg. IAS/Shunts: No atrial level shunt detected by color flow Doppler. LEFT VENTRICLE PLAX 2D LVIDd:         3.60 cm LVIDs:  1.60 cm LV PW:         0.70 cm LV IVS:        0.80 cm  RIGHT VENTRICLE RV Basal diam:  5.50 cm RV Mid diam:    4.10 cm RIGHT ATRIUM           Index RA Area:     19.40 cm RA Volume:   55.30 ml  28.85 ml/m  TRICUSPID VALVE TR Peak grad:   42.2 mmHg TR Vmax:        325.00 cm/s Chilton Si MD Electronically signed by Chilton Si MD Signature Date/Time: 02/18/2023/2:54:31 PM    Final    DG CHEST PORT 1 VIEW Result Date: 02/16/2023 CLINICAL DATA:  CHF.  Lower extremity swelling. EXAM: PORTABLE CHEST 1 VIEW COMPARISON:  Radiographs yesterday. FINDINGS: Right upper extremity PICC and right internal jugular central line remain in place. Cardiomegaly is unchanged. Similar pulmonary edema. There are small bilateral pleural effusions. No focal airspace disease. No pneumothorax. IMPRESSION: Cardiomegaly with pulmonary  edema and small bilateral pleural effusions. Electronically Signed   By: Narda Rutherford M.D.   On: 02/16/2023 15:48   DG CHEST PORT 1 VIEW Result Date: 02/15/2023 CLINICAL DATA:  Acute kidney injury, line placement for dialysis. EXAM: PORTABLE CHEST 1 VIEW COMPARISON:  02/12/2023 FINDINGS: Right central line tip is at the cavoatrial junction. Cardiomegaly with vascular congestion and diffuse interstitial prominence, likely interstitial edema. No pneumothorax. No acute bony abnormality. IMPRESSION: Right central line tip at the cavoatrial junction.  No pneumothorax. Cardiomegaly, mild pulmonary edema. Electronically Signed   By: Charlett Nose M.D.   On: 02/15/2023 19:20   CARDIAC CATHETERIZATION Result Date: 02/14/2023 1. Elevated RA pressure, borderline elevated PCWP. 2. Severe mixed pulmonary arterial/pulmonary venous hypertension. 3. High cardiac output.  No evidence for shunt lesion (no step up in oxygen saturation from SVC to PA). 4. PAPi preserved. With high CO, will decrease milrinone to 0.125. Will add sildenafil 20 mg tid. She is going to start CVVH.   DG CHEST PORT 1 VIEW Result Date: 02/12/2023 CLINICAL DATA:  Central line placement. EXAM: PORTABLE CHEST 1 VIEW COMPARISON:  02/09/2023 FINDINGS: Low volume film. The cardio pericardial silhouette is enlarged. Bibasilar collapse/consolidation noted, left greater than right and stable to minimally progressive in the left base. There is pulmonary vascular congestion without overt pulmonary edema. Right PICC line tip overlies the region of the innominate vein confluence. Telemetry leads overlie the chest. IMPRESSION: 1. Right PICC line tip overlies the region of the innominate vein confluence. 2. Low volume film with bibasilar collapse/consolidation, left greater than right. Electronically Signed   By: Kennith Center M.D.   On: 02/12/2023 07:44   [place] EKG SITE RITE Result Date: 02/10/2023 If Site Rite image not attached, placement could not be confirmed  due to current cardiac rhythm.  NM Pulmonary Perfusion Result Date: 02/09/2023 CLINICAL DATA:  Pulmonary hypertension suspected.  Leg swelling EXAM: NUCLEAR MEDICINE PERFUSION LUNG SCAN TECHNIQUE: Perfusion images were obtained in multiple projections after intravenous injection of radiopharmaceutical. Ventilation scans intentionally deferred if perfusion scan and chest x-ray adequate for interpretation during COVID 19 epidemic. RADIOPHARMACEUTICALS:  4.3 mCi Tc-19m MAA IV COMPARISON:  Chest radiograph 02/09/2023 FINDINGS: Wedge-shaped perfusion defects are demonstrated in the superior and posterior basal segments of the right lower lung and posterior basal segment of the left lower lung. IMPRESSION: Perfusion defects are demonstrated in both lower lungs consistent with presence of pulmonary embolus by PISAPED criteria. Electronically Signed   By: Chrissie Noa  Andria Meuse M.D.   On: 02/09/2023 19:09   DG CHEST PORT 1 VIEW Result Date: 02/09/2023 CLINICAL DATA:  Pulmonary hypertension.  Lower extremity swelling. EXAM: PORTABLE CHEST 1 VIEW COMPARISON:  Chest radiograph dated 02/04/2023. FINDINGS: There is cardiomegaly with mild central vascular congestion. No focal consolidation, pleural effusion, pneumothorax. No acute osseous pathology. IMPRESSION: Cardiomegaly with mild central vascular congestion. Electronically Signed   By: Elgie Collard M.D.   On: 02/09/2023 16:22   VAS [place] LOWER EXTREMITY VENOUS (DVT) Result Date: 02/08/2023  Lower Venous DVT Study Patient Name:  QUENTIN STREBEL  Date of Exam:   02/07/2023 Medical Rec #: [id number]          Accession #:    [id number] Date of Birth: Mar 14, 1965          Patient Gender: F Patient Age:   79 years Exam Location:  [facility] Procedure:      VAS [place] LOWER EXTREMITY VENOUS (DVT) Referring Phys: TIFFANY [place] --------------------------------------------------------------------------------  Indications: Edema.  Limitations: Body habitus, poor ultrasound/tissue  interface and RLE BKA. Comparison Study: Previous exam on 04/11/2019 was negative for DVT Performing Technologist: Ernestene Mention RVT, RDMS  Examination Guidelines: A complete evaluation includes B-mode imaging, spectral Doppler, color Doppler, and power Doppler as needed of all accessible portions of each vessel. Bilateral testing is considered an integral part of a complete examination. Limited examinations for reoccurring indications may be performed as noted. The reflux portion of the exam is performed with the patient in reverse Trendelenburg.  +---------+---------------+---------+-----------+----------+-------------------+ RIGHT    CompressibilityPhasicitySpontaneityPropertiesThrombus Aging      +---------+---------------+---------+-----------+----------+-------------------+ CFV      Full           No       Yes        pulsatile                     +---------+---------------+---------+-----------+----------+-------------------+ SFJ      Full                                                             +---------+---------------+---------+-----------+----------+-------------------+ FV Prox  Full           No                  pulsatile                     +---------+---------------+---------+-----------+----------+-------------------+ FV Mid   Full           No                  pulsatile                     +---------+---------------+---------+-----------+----------+-------------------+ FV Distal               No       Yes        pulsatile Not well visualized +---------+---------------+---------+-----------+----------+-------------------+ PFV      Full                                                             +---------+---------------+---------+-----------+----------+-------------------+  POP      Partial        Yes      Yes        pulsatile Age Indeterminate   +---------+---------------+---------+-----------+----------+-------------------+ PTV                                                    bka                 +---------+---------------+---------+-----------+----------+-------------------+ PERO                                                  bka                 +---------+---------------+---------+-----------+----------+-------------------+   +---------+---------------+---------+-----------+----------+-------------------+ LEFT     CompressibilityPhasicitySpontaneityPropertiesThrombus Aging      +---------+---------------+---------+-----------+----------+-------------------+ CFV      Full           No       Yes        pulsatile                     +---------+---------------+---------+-----------+----------+-------------------+ SFJ      Full                                                             +---------+---------------+---------+-----------+----------+-------------------+ FV Prox  Full           No       Yes        pulsatile                     +---------+---------------+---------+-----------+----------+-------------------+ FV Mid   Full           No       Yes        pulsatile                     +---------+---------------+---------+-----------+----------+-------------------+ FV DistalFull           No       Yes        pulsatile                     +---------+---------------+---------+-----------+----------+-------------------+ PFV      Full                                                             +---------+---------------+---------+-----------+----------+-------------------+ POP      Full           No       Yes        pulsatile                     +---------+---------------+---------+-----------+----------+-------------------+ PTV      Full  Not well visualized +---------+---------------+---------+-----------+----------+-------------------+ PERO     Full                                         Not well visualized  +---------+---------------+---------+-----------+----------+-------------------+     Summary: BILATERAL: -No evidence of popliteal cyst, bilaterally. -Subcutaneous edema, bilaterally RIGHT: - Findings consistent with age indeterminate deep vein thrombosis involving the right popliteal vein. Ultrasound characteristics of enlarged lymph nodes are noted in the groin.  LEFT: - There is no evidence of deep vein thrombosis in the lower extremity. - Ultrasound characteristics of enlarged lymph nodes noted in the groin.  *See table(s) above for measurements and observations. Electronically signed by Sherald Hess MD on 02/08/2023 at 11:46:06 AM.    Final    CT ABDOMEN PELVIS WO CONTRAST Result Date: 02/06/2023 CLINICAL DATA:  58 year old female with history of nonlocalized abdominal pain. Diarrhea. EXAM: CT ABDOMEN AND PELVIS WITHOUT CONTRAST TECHNIQUE: Multidetector CT imaging of the abdomen and pelvis was performed following the standard protocol without IV contrast. RADIATION DOSE REDUCTION: This exam was performed according to the departmental dose-optimization program which includes automated exposure control, adjustment of the mA and/or kV according to patient size and/or use of iterative reconstruction technique. COMPARISON:  CT of the abdomen and pelvis 09/23/2020. FINDINGS: Lower chest: Trace left pleural effusion. Mild scarring in the visualized lung bases. Cardiomegaly. Hepatobiliary: No definite suspicious cystic or solid hepatic lesions are confidently identified on today's noncontrast CT examination. Small amount of high attenuation material in the lumen of the gallbladder most evident near the fundus. Gallbladder is nearly completely decompressed. Pancreas: No definite pancreatic mass or peripancreatic fluid collections or inflammatory changes are confidently identified on today's noncontrast CT examination. Spleen: Status post splenectomy. Small splenules are noted in the left upper quadrant.  Adrenals/Urinary Tract: Unenhanced appearance of the kidneys bilaterally is unremarkable. No hydroureteronephrosis. Urinary bladder is unremarkable in appearance. Bilateral adrenal glands are normal in appearance. Stomach/Bowel: Unenhanced appearance of the stomach is normal. No pathologic dilatation of small bowel or colon. Rectal bag noted. Normal appendix. Vascular/Lymphatic: Atherosclerosis in the abdominal and pelvic vasculature. No lymphadenopathy noted in the abdomen or pelvis. Reproductive: Uterus and ovaries are atrophic and otherwise unremarkable in appearance. Other: Trace volume of ascites. Mild diffuse mesenteric edema. No pneumoperitoneum. Musculoskeletal: Mild diffuse body wall edema. There are no aggressive appearing lytic or blastic lesions noted in the visualized portions of the skeleton. IMPRESSION: 1. No definite acute findings are noted in the abdomen or pelvis to account for the patient's symptoms. 2. Trace left pleural effusion. Trace volume of ascites. Mild diffuse mesenteric edema. Mild diffuse body wall edema. Findings are suggestive of anasarca. 3. Cardiomegaly. 4. Atherosclerosis. Electronically Signed   By: Trudie Reed M.D.   On: 02/06/2023 08:53   ECHOCARDIOGRAM COMPLETE Result Date: 02/05/2023    ECHOCARDIOGRAM REPORT   Patient Name:   KHYLEE ALGEO Date of Exam: 02/05/2023 Medical Rec #:  [id number]         Height:       67.0 in Accession #:    [id number]        Weight:       227.1 lb Date of Birth:  [birth year]         BSA:          2.134 m Patient Age:    72 years  BP:           122/51 mmHg Patient Gender: F                 HR:           70 bpm. Exam Location:  Inpatient Procedure: 2D Echo, Color Doppler and Cardiac Doppler Indications:    CHF  History:        Patient has prior history of Echocardiogram examinations, most                 recent 06/13/2020. CHF, PAD; Risk Factors:Hypertension and                 Diabetes.  Sonographer:    Amy Chionchio Referring Phys:  John Giovanni IMPRESSIONS  1. Left ventricular ejection fraction, by estimation, is 55 to 60%. Left ventricular ejection fraction by 2D MOD biplane is 55.8 %. The left ventricle has normal function. The left ventricle has no regional wall motion abnormalities. Left ventricular diastolic parameters are consistent with Grade I diastolic dysfunction (impaired relaxation). There is the interventricular septum is flattened in systole, consistent with right ventricular pressure overload.  2. Right ventricular systolic function is low normal. The right ventricular size is moderately enlarged. There is severely elevated pulmonary artery systolic pressure. The estimated right ventricular systolic pressure is 64.6 mmHg.  3. Right atrial size was severely dilated.  4. The mitral valve is grossly normal. Trivial mitral valve regurgitation.  5. The tricuspid valve is abnormal. Tricuspid valve regurgitation is moderate to severe.  6. The aortic valve is tricuspid. Aortic valve regurgitation is not visualized.  7. The inferior vena cava is dilated in size with <50% respiratory variability, suggesting right atrial pressure of 15 mmHg. Comparison(s): Changes from prior study are noted. 06/13/2020: LVEF 60-65%, mild LVH, moderate RAE, mild MR. FINDINGS  Left Ventricle: Left ventricular ejection fraction, by estimation, is 55 to 60%. Left ventricular ejection fraction by 2D MOD biplane is 55.8 %. The left ventricle has normal function. The left ventricle has no regional wall motion abnormalities. The left ventricular internal cavity size was normal in size. There is no left ventricular hypertrophy. The interventricular septum is flattened in systole, consistent with right ventricular pressure overload. Left ventricular diastolic parameters are consistent with Grade I diastolic dysfunction (impaired relaxation). Indeterminate filling pressures. Right Ventricle: The right ventricular size is moderately enlarged. No increase in right  ventricular wall thickness. Right ventricular systolic function is low normal. There is severely elevated pulmonary artery systolic pressure. The tricuspid regurgitant velocity is 3.52 m/s, and with an assumed right atrial pressure of 15 mmHg, the estimated right ventricular systolic pressure is 64.6 mmHg. Left Atrium: Left atrial size was normal in size. Right Atrium: Right atrial size was severely dilated. Pericardium: There is no evidence of pericardial effusion. Mitral Valve: The mitral valve is grossly normal. Trivial mitral valve regurgitation. MV peak gradient, 3.8 mmHg. The mean mitral valve gradient is 1.0 mmHg. Tricuspid Valve: The tricuspid valve is abnormal. Tricuspid valve regurgitation is moderate to severe. Aortic Valve: The aortic valve is tricuspid. Aortic valve regurgitation is not visualized. Aortic valve mean gradient measures 8.0 mmHg. Aortic valve peak gradient measures 14.9 mmHg. Aortic valve area, by VTI measures 2.52 cm. Pulmonic Valve: The pulmonic valve was normal in structure. Pulmonic valve regurgitation is not visualized. Aorta: The aortic root and ascending aorta are structurally normal, with no evidence of dilitation. Venous: The inferior vena cava is dilated in size with less than 50%  respiratory variability, suggesting right atrial pressure of 15 mmHg. IAS/Shunts: The interatrial septum was not well visualized.  LEFT VENTRICLE PLAX 2D                        Biplane EF (MOD) LVIDd:         5.10 cm         LV Biplane EF:   Left LVIDs:         3.60 cm                          ventricular LV PW:         1.10 cm                          ejection LV IVS:        0.90 cm                          fraction by LVOT diam:     2.00 cm                          2D MOD LV SV:         70                               biplane is LV SV Index:   33                               55.8 %. LVOT Area:     3.14 cm                                Diastology                                LV e' medial:     5.98 cm/s LV Volumes (MOD)               LV E/e' medial:  11.4 LV vol d, MOD    78.9 ml       LV e' lateral:   5.66 cm/s A2C:                           LV E/e' lateral: 12.0 LV vol d, MOD    81.3 ml A4C: LV vol s, MOD    44.3 ml A2C: LV vol s, MOD    26.1 ml A4C: LV SV MOD A2C:   34.6 ml LV SV MOD A4C:   81.3 ml LV SV MOD BP:    44.5 ml RIGHT VENTRICLE             IVC RV Basal diam:  4.20 cm     IVC diam: 2.50 cm RV Mid diam:    4.10 cm RV S prime:     10.90 cm/s TAPSE (M-mode): 1.8 cm LEFT ATRIUM             Index        RIGHT ATRIUM           Index LA Vol (A2C):  64.3 ml 30.13 ml/m  RA Area:     32.80 cm LA Vol (A4C):   40.4 ml 18.93 ml/m  RA Volume:   128.00 ml 59.97 ml/m LA Biplane Vol: 56.3 ml 26.38 ml/m  AORTIC VALVE                     PULMONIC VALVE AV Area (Vmax):    2.02 cm      PV Vmax:       0.60 m/s AV Area (Vmean):   2.09 cm      PV Peak grad:  1.4 mmHg AV Area (VTI):     2.52 cm AV Vmax:           193.00 cm/s AV Vmean:          125.000 cm/s AV VTI:            0.279 m AV Peak Grad:      14.9 mmHg AV Mean Grad:      8.0 mmHg LVOT Vmax:         124.00 cm/s LVOT Vmean:        83.100 cm/s LVOT VTI:          0.224 m LVOT/AV VTI ratio: 0.80  AORTA Ao Root diam: 2.50 cm Ao Asc diam:  2.90 cm MITRAL VALVE               TRICUSPID VALVE MV Area (PHT): 3.34 cm    TR Peak grad:   49.6 mmHg MV Area VTI:   2.76 cm    TR Vmax:        352.00 cm/s MV Peak grad:  3.8 mmHg MV Mean grad:  1.0 mmHg    SHUNTS MV Vmax:       0.97 m/s    Systemic VTI:  0.22 m MV Vmean:      55.8 cm/s   Systemic Diam: 2.00 cm MV Decel Time: 227 msec MV E velocity: 68.10 cm/s MV A velocity: 81.20 cm/s MV E/A ratio:  0.84 Zoila Shutter MD Electronically signed by Zoila Shutter MD Signature Date/Time: 02/05/2023/2:38:24 PM    Final    US RENAL Result Date: 02/05/2023 CLINICAL DATA:  Acute kidney injury EXAM: RENAL / URINARY TRACT ULTRASOUND COMPLETE COMPARISON:  CT 09/23/2020 FINDINGS: Right Kidney: Renal measurements: 10.3 x 4 x 6.6 =  volume: 141 mL. Echogenicity within normal limits. No mass or hydronephrosis visualized. Left Kidney: Renal measurements: 11.7 x 5.2 x 5.7 = volume: 180 mL. Echogenicity within normal limits. No mass or hydronephrosis visualized. Bladder: Appears normal for degree of bladder distention. Other: None. IMPRESSION: Negative. Electronically Signed   By: Corlis Leak M.D.   On: 02/05/2023 08:07   DG Chest Portable 1 View Result Date: 02/04/2023 CLINICAL DATA:  CHF EXAM: PORTABLE CHEST 1 VIEW COMPARISON:  09/28/2020 FINDINGS: Cardiomegaly with mild central congestion. Probable trace pleural effusions. No focal consolidation or pneumothorax. Atelectasis or scarring at the right base IMPRESSION: Cardiomegaly with mild central congestion and probable trace pleural effusions. Electronically Signed   By: Jasmine Pang M.D.   On: 02/04/2023 20:52    Microbiology Recent Results (from the past 240 hours)  Resp panel by RT-PCR (RSV, Flu A&B, Covid) Anterior Nasal Swab     Status: None   Collection Time: 02/14/23 12:17 PM   Specimen: Anterior Nasal Swab  Result Value Ref Range Status   SARS Coronavirus 2 by RT PCR NEGATIVE NEGATIVE Final   Influenza A by PCR NEGATIVE  NEGATIVE Final   Influenza B by PCR NEGATIVE NEGATIVE Final    Comment: (NOTE) The Xpert Xpress SARS-CoV-2/FLU/RSV plus assay is intended as an aid in the diagnosis of influenza from Nasopharyngeal swab specimens and should not be used as a sole basis for treatment. Nasal washings and aspirates are unacceptable for Xpert Xpress SARS-CoV-2/FLU/RSV testing.  Fact Sheet for Patients: BloggerCourse.com  Fact Sheet for Healthcare Providers: SeriousBroker.it  This test is not yet approved or cleared by the [place] FDA and has been authorized for detection and/or diagnosis of SARS-CoV-2 by FDA under an Emergency Use Authorization (EUA). This EUA will remain in effect (meaning this test can be  used) for the duration of the COVID-19 declaration under Section 564(b)(1) of the Act, 21 U.S.C. section 360bbb-3(b)(1), unless the authorization is terminated or revoked.     Resp Syncytial Virus by PCR NEGATIVE NEGATIVE Final    Comment: (NOTE) Fact Sheet for Patients: BloggerCourse.com  Fact Sheet for Healthcare Providers: SeriousBroker.it  This test is not yet approved or cleared by the [place] FDA and has been authorized for detection and/or diagnosis of SARS-CoV-2 by FDA under an Emergency Use Authorization (EUA). This EUA will remain in effect (meaning this test can be used) for the duration of the COVID-19 declaration under Section 564(b)(1) of the Act, 21 U.S.C. section 360bbb-3(b)(1), unless the authorization is terminated or revoked.  Performed at [facility], [address] [place], [place] [address]   MRSA Next Gen by PCR, Nasal     Status: None   Collection Time: 02/14/23  6:24 PM   Specimen: Nasal Mucosa; Nasal Swab  Result Value Ref Range Status   MRSA by PCR Next Gen NOT DETECTED NOT DETECTED Final    Comment: (NOTE) The GeneXpert MRSA Assay (FDA approved for NASAL specimens only), is one component of a comprehensive MRSA colonization surveillance program. It is not intended to diagnose MRSA infection nor to guide or monitor treatment for MRSA infections. Test performance is not FDA approved in patients less than 39 years old. Performed at [facility], [address] [place], [place] [address]   Respiratory (~20 pathogens) panel by PCR     Status: None   Collection Time: 02/14/23  7:42 PM   Specimen: Nasopharyngeal Swab; Respiratory  Result Value Ref Range Status   Adenovirus NOT DETECTED NOT DETECTED Final   Coronavirus 229E NOT DETECTED NOT DETECTED Final    Comment: (NOTE) The Coronavirus on the Respiratory Panel, DOES NOT test for the novel  Coronavirus (2019 nCoV)     Coronavirus HKU1 NOT DETECTED NOT DETECTED Final   Coronavirus NL63 NOT DETECTED NOT DETECTED Final   Coronavirus OC43 NOT DETECTED NOT DETECTED Final   Metapneumovirus NOT DETECTED NOT DETECTED Final   Rhinovirus / Enterovirus NOT DETECTED NOT DETECTED Final   Influenza A NOT DETECTED NOT DETECTED Final   Influenza B NOT DETECTED NOT DETECTED Final   Parainfluenza Virus 1 NOT DETECTED NOT DETECTED Final   Parainfluenza Virus 2 NOT DETECTED NOT DETECTED Final   Parainfluenza Virus 3 NOT DETECTED NOT DETECTED Final   Parainfluenza Virus 4 NOT DETECTED NOT DETECTED Final   Respiratory Syncytial Virus NOT DETECTED NOT DETECTED Final   Bordetella pertussis NOT DETECTED NOT DETECTED Final   Bordetella Parapertussis NOT DETECTED NOT DETECTED Final   Chlamydophila pneumoniae NOT DETECTED NOT DETECTED Final   Mycoplasma pneumoniae NOT DETECTED NOT DETECTED Final    Comment: Performed at [facility], [address] [place], [place] [address]    Lab Basic Metabolic Panel: Recent Labs  Lab 02/15/23 [id number] 02/15/23 1605 02/16/23 0441 02/16/23 1632 02/17/23 0409 02/17/23 1600 02/18/23 0411 02/18/23 1607 02/22/2023 0014 02/21/2023 0034 02/22/2023 0036 03/03/2023 0106  NA 132*   < > 133*   < > 132* 130* 128* 134* 133* 137 137 140  K 3.5   < > 3.4*   < > 3.8 4.1 4.1 4.1 5.3* 5.1 5.1 5.0  CL 101   < > 102   < > 97* 99 97* 97* 95*  --  105  --   CO2 22   < > 24   < > 24 25 23 23  15*  --   --   --   GLUCOSE 107*   < > 107*   < > 115* 129* 99 164* 383*  --  292*  --   BUN 46*   < > 26*   < > 14 13 9 9 13   --  13  --   CREATININE 1.41*   < > 1.33*   < > 1.25* 1.38* 1.27* 1.30* 1.79*  --  1.40*  --   CALCIUM 7.8*   < > 7.7*   < > 8.5* 8.1* 8.2* 8.1* 8.2*  --   --   --   MG 2.1  --  2.1  --  2.2  --  2.2  --  2.8*  --   --   --   PHOS 3.1   < > 2.3*   < > 2.1* 1.9* 2.2* >30.0* 6.3*  --   --   --    < > = values in this interval not displayed.   Liver Function Tests: Recent Labs  Lab  02/17/23 0409 02/17/23 1600 02/18/23 0411 02/18/23 1607 02/25/2023 0014  AST  --   --   --   --  38  ALT  --   --   --   --  12  ALKPHOS  --   --   --   --  85  BILITOT  --   --   --   --  0.7  PROT  --   --   --   --  7.3  ALBUMIN 2.3* 2.4* 2.5* 2.6* 2.5*   No results for input(s): "LIPASE", "AMYLASE" in the last 168 hours. No results for input(s): "AMMONIA" in the last 168 hours. CBC: Recent Labs  Lab 02/15/23 0450 02/16/23 0441 02/17/23 0409 02/18/23 0411 02/05/2023 0014 02/16/2023 0034 02/22/2023 0036 03/01/2023 0100 03/02/2023 0106  WBC 11.7* 12.5* 11.7* 11.8*  --   --   --  12.5*  --   NEUTROABS  --   --   --   --   --   --   --  9.5*  --   HGB 8.3* 8.3* 8.7* 9.0*  --  11.2* 10.9* 7.4* 8.8*  HCT 24.7* 24.9* 26.0* 27.6*  --  33.0* 32.0* 25.3* 26.0*  MCV 87.9 89.6 89.3 91.4  --   --   --  102.4*  --   PLT 259 247 272 284 248  --   --  200  --    Cardiac Enzymes: No results for input(s): "CKTOTAL", "CKMB", "CKMBINDEX", "TROPONINI" in the last 168 hours. Sepsis Labs: Recent Labs  Lab 02/16/23 0441 02/17/23 0409 02/18/23 0411 02/17/2023 0014 02/07/2023 0100  WBC 12.5* 11.7* 11.8*  --  12.5*  LATICACIDVEN  --   --   --  >  9.0*  --     Procedures/Operations     [employer] 02/05/2023, 2:14 PM

## 2023-03-05 NOTE — Progress Notes (Signed)
   02/20/2023 0023  Spiritual Encounters  Type of Visit Initial  Conversation partners present during encounter Nurse  Referral source Code page  Reason for visit Code  OnCall Visit Yes   Chaplain attended Code Blue.  No family/support person present.   Chaplain services remain available by Spiritual Consult or for emergent cases, paging [phone]  Chaplain Raelene Bott, MDiv [email or web address] [phone]

## 2023-03-05 NOTE — Code Documentation (Signed)
  Patient Name: [name]   MRN: [id number]   Date of Birth/ Sex: 05-09-1965 , female      Admission Date: 02/04/2023  Attending Provider: Cheri Fowler, MD  Primary Diagnosis: Acute on chronic heart failure with preserved ejection fraction (HFpEF) (HCC)   Indication: Pt was in her usual state of health until this PM, when she was noted to be in PEA arrest. Code blue was subsequently called. At the time of arrival on scene, ACLS protocol was underway.   Technical Description:  - CPR performance duration:  10 minute  - Was defibrillation or cardioversion used? No   - Was external pacer placed? Yes  - Was patient intubated pre/post CPR? Yes   Medications Administered: Y = Yes; Blank = No Amiodarone    Atropine    Calcium    Epinephrine  Y  Lidocaine    Magnesium    Norepinephrine    Phenylephrine    Sodium bicarbonate  Y  Vasopressin     Post CPR evaluation:  - Final Status - Was patient successfully resuscitated ? Yes - What is current rhythm? SVT  - What is current hemodynamic status? UNSTABLE  Miscellaneous Information:  - Labs sent, including: CBC, RFP, troponin, lactic   - Primary team notified?  Yes  - Family Notified? Yes  - Additional notes/ transfer status: N/a     Modena Slater, DO  02/23/2023, 12:22 AM

## 2023-03-05 NOTE — Code Documentation (Signed)
 Patient time of death occurred at 47.

## 2023-03-05 NOTE — Plan of Care (Signed)
 Cardiology Brief Note  Notified of patient in PEA arrest with known moderate-sized effusion on TTE yesterday but no echo indications of tamponade at the time. STEMI attending was notified as well and was en route. On my assessment the patient had obtained ROSC but MAPs were in the 30-40s. Bedside ultrasound performed by me did show a circumferential moderate-sized effusion with some echo-dense material in the pericardium. The LV cavity was very small / appeared collapsed and the RV was relatively dilated. There did not appear to be any diastolic RV collapse. There was some slight variability in SBP on arterial line waveform but pulsus paradoxus could not be reliably assessed given the irregular HR and declining MAP. Known history of DVT/PE and was receiving heparin. A massive PE was possible. Discussed with STEMI attending who was en route and thought tamponade less likely to be the etiology. The patient went into PEA arrest shortly afterwards and decision was made by the primary team to not resume CPR given already prolonged code.  Lucila Maine, MD

## 2023-03-05 NOTE — Progress Notes (Signed)
 PCCM Interval Progress Note:  Called to bedside at 2357 for CODE BLUE. On arrival, CPR was in progress with Code Team at bedside. CPR initiated at 2355 with initial rhythm PEA, multiple rounds of Epi administered and ROSC 0002. Intubated by Anesthesia at 0003. Amiodarone initiated for AF with RVR. Patient became hypotensive once again with SBP 80s and steadily dropping; CPR resumed 0019 with transcutaneous pacing. Bedside Cardiac POCUS completed demonstrating poor ventricular function/significant RV failure and 2cm pericardial effusion with concern for tamponade physiology. Cardiology fellow was paged at [phone]. Contacted E-Link [phone] for assistance with contacting Cardiology, who spoke with Dr. Clifton James re: deployment of team for possible pericardiocentesis in cath lab.   ROSC achieved 0033, After ROSC, VBG was obtained demonstrating pH 6.9/bicarb 11.3. A-line was placed at 0042 and Epi gtt started for persistently low BP. Neo started 0057. Cardiology fellow then arrived and completed bedside POCUS with similar moderate cirumferential pericardial effusion/?tamponade physiology noted and poor ventricular function as above with small LV and dilated RV. Concern for massive PE given history of DVT/PE. Additionally, patient had new anterior neck swelling with presumed hematoma tracking up along the mediastinum. At this point, patient's BP had plummeted to SBP 30s despite max doses of Levophed, Epinephrine and Neosynephrine gtts. Multiple amps of bicarbonate were administered to no avail.  Due to profound presumed obstructive shock and impending cardiac collapse, code was called at 0112 after over 40 total minutes of CPR time. Patient continued to have intermittent ectopic beats until she became asystolic with no heart or lung sounds auscultated x 1 full minute. TOD called at 0129. At this point, patient's personal cell phone began to ring with "Cristal Deer" calling; patient's care RN answered this call and it was  patient's son who was notified of patient's passing. Appreciate 2H CVICU team, E-Link and Cardiology team's assistance.  Cardiac arrest, PEA secondary to ?massive DVT vs. cardiac tamponade Acute on chronic right-sided heart failure Acute bilateral pulmonary emboli with cor pulmonale Demand cardiac ischemia Acute right popliteal DVT Severe pulmonary hypertension AKI on CKD stage III Hypovolemic hyponatremia/hypocalcemia Diabetes type 2 Anemia of chronic disease  - Unfortunately, patient expired at the end of this code, TOD 0129 - Spiritual care consult offered to son via phone; he does not plan to come to the hospital right now - Notified decedent care  Tim Lair, PA-C [facility] 02/09/2023 2:31 AM  Please see [email or web address] for pager details.  From 7A-7P if no response, please call [phone] After hours, please call ELink [phone]

## 2023-03-05 NOTE — Code Documentation (Signed)
 Pt's son called the pt's personal cell. Primary RN T'eil answered, notified him of pt's TOD.

## 2023-03-05 NOTE — Procedures (Signed)
 Arterial Catheter Insertion Procedure Note  PALESTINE MOSCO  [id number]  02/09/1965  Date:02/07/2023  Time:1:21 AM    Provider Performing: Patrici Ranks    Procedure: Insertion of Arterial Line (82956) with US guidance (21308)   Indication(s) Blood pressure monitoring and/or need for frequent ABGs  Consent Unable to obtain consent due to emergent nature of procedure.  Anesthesia None   Time Out Verified patient identification, verified procedure, site/side was marked, verified correct patient position, special equipment/implants available, medications/allergies/relevant history reviewed, required imaging and test results available.   Sterile Technique Maximal sterile technique including full sterile barrier drape, hand hygiene, sterile gown, sterile gloves, mask, hair covering, sterile ultrasound probe cover (if used).   Procedure Description Area of catheter insertion was cleaned with chlorhexidine and draped in sterile fashion. With real-time ultrasound guidance an arterial catheter was placed into the left radial artery.  Appropriate arterial tracings confirmed on monitor.     Complications/Tolerance None; patient tolerated the procedure well.   EBL Minimal   Specimen(s) None

## 2023-03-05 NOTE — Code Documentation (Signed)
 ACLS started 23:55, PEA. CRRT was stopped. 3 Epi and 1 bicarb ampoule --->ROSC 0002 Intubated 0003 by anesthesia  Afib/RVR: Amiodarone bolus was stopped due to hypotension Levophed and NS 0.9% bolus 1 L started PEA 0027: 2 Epi--->ROSC 0033 PEA 0019: 4 Epi, 3 ampoules Bicarb--->ROSC 0040 A-line 0042 Epi drip 0049 Neo synephrine drip 0057 STEM attending notified Michel.Floss Cardiology fellow 0112 PEA again 0115. No more ACLS attempted  Death time 0129 Bedside Echo by me and cardiology fellows: very poor ventricular function and 2 cm pericardial effusion.

## 2023-03-05 NOTE — Code Documentation (Signed)
 Echo done by Dr. Ninfa Meeker with Dr. Lonzo Candy at bedside as well.

## 2023-03-05 NOTE — Progress Notes (Addendum)
 eLink Physician-Brief Progress Note Patient Name: MAYARI MATUS DOB: 02/04/1965 MRN: [id number]   Date of Service  02/28/2023  HPI/Events of Note  58 year old female who is here with acute on chronic right-sided heart failure with pulmonary hypertension.  Patient has severe RV failure and concurrent fluid overload with acute kidney injury.  Phosphate greater than 30.   Patient had a sudden onset PEA arrest.  Patient received roughly 3 rounds of epinephrine, bicarbonate, calcium, and magnesium.  Code team arrived at bedside.  Patient was intubated by anesthesia.  Escalated peripheral vasopressors.  eICU Interventions  Had to return CRRT blood.  Post ROSC labs, chest radiograph, EKG ordered.  Ground team at bedside.  Pending reevaluation.   7829 -patient had a repeat cardiac arrest.  Eventually achieved ROSC.  Bedside echocardiogram reveals substantial pericardial effusion with concern for tamponade physiology.  On-call cardiology fellow notified and referred to [place] to the STEMI team.  I spoke with Dr. Clifton James who will arrange for an emergent echocardiogram and potentially intervention.  Ground team at bedside.  Attempted to call son x 2 with no response and left a message.  Intervention Category Major Interventions: Code management / supervision  Evian Derringer 02/18/2023, 12:04 AM

## 2023-03-05 NOTE — Code Documentation (Addendum)
 Per Dr. Lonzo Candy and Dr. Lajean Manes, interventions stopped due to medical futility.

## 2023-03-05 NOTE — Progress Notes (Signed)
 IV team responded to Code Blue.  No intervention needed from IV Team.

## 2023-03-05 DEATH — deceased
